# Patient Record
Sex: Male | Born: 1960
Health system: Southern US, Community
[De-identification: ages and names within clinical notes are randomized; demographics above are authoritative.]

## PROBLEM LIST (undated history)

## (undated) DIAGNOSIS — N186 End stage renal disease: Secondary | ICD-10-CM

## (undated) DIAGNOSIS — I2510 Atherosclerotic heart disease of native coronary artery without angina pectoris: Secondary | ICD-10-CM

## (undated) DIAGNOSIS — I509 Heart failure, unspecified: Secondary | ICD-10-CM

## (undated) DIAGNOSIS — Z992 Dependence on renal dialysis: Secondary | ICD-10-CM

## (undated) DIAGNOSIS — E785 Hyperlipidemia, unspecified: Secondary | ICD-10-CM

## (undated) DIAGNOSIS — I739 Peripheral vascular disease, unspecified: Secondary | ICD-10-CM

## (undated) DIAGNOSIS — N289 Disorder of kidney and ureter, unspecified: Secondary | ICD-10-CM

## (undated) DIAGNOSIS — F1910 Other psychoactive substance abuse, uncomplicated: Secondary | ICD-10-CM

## (undated) DIAGNOSIS — F1010 Alcohol abuse, uncomplicated: Secondary | ICD-10-CM

## (undated) DIAGNOSIS — K922 Gastrointestinal hemorrhage, unspecified: Secondary | ICD-10-CM

## (undated) DIAGNOSIS — K7460 Unspecified cirrhosis of liver: Secondary | ICD-10-CM

## (undated) DIAGNOSIS — Z951 Presence of aortocoronary bypass graft: Secondary | ICD-10-CM

## (undated) DIAGNOSIS — R45851 Suicidal ideations: Secondary | ICD-10-CM

## (undated) DIAGNOSIS — K219 Gastro-esophageal reflux disease without esophagitis: Secondary | ICD-10-CM

## (undated) DIAGNOSIS — I10 Essential (primary) hypertension: Secondary | ICD-10-CM

## (undated) HISTORY — PX: CORONARY ARTERY BYPASS GRAFT: SHX141

## (undated) HISTORY — DX: Peripheral vascular disease, unspecified: I73.9

## (undated) HISTORY — PX: CORONARY ANGIOPLASTY: SHX604

## (undated) HISTORY — PX: DIALYSIS FISTULA CREATION: SHX611

## (undated) HISTORY — PX: PERIPHERAL ARTERIAL STENT GRAFT: SHX2220

## (undated) HISTORY — PX: PARACENTESIS: SHX844

## (undated) HISTORY — DX: Hyperlipidemia, unspecified: E78.5

---

## 2007-03-15 DIAGNOSIS — I2510 Atherosclerotic heart disease of native coronary artery without angina pectoris: Secondary | ICD-10-CM

## 2007-03-15 HISTORY — DX: Atherosclerotic heart disease of native coronary artery without angina pectoris: I25.10

## 2009-11-18 ENCOUNTER — Emergency Department: Payer: Self-pay | Admitting: Internal Medicine

## 2009-11-19 ENCOUNTER — Observation Stay: Payer: Self-pay | Admitting: Internal Medicine

## 2009-12-17 ENCOUNTER — Inpatient Hospital Stay: Payer: Self-pay | Admitting: Internal Medicine

## 2010-02-11 ENCOUNTER — Ambulatory Visit: Payer: Self-pay | Admitting: Vascular Surgery

## 2010-02-16 ENCOUNTER — Emergency Department: Payer: Self-pay | Admitting: Emergency Medicine

## 2010-02-16 ENCOUNTER — Ambulatory Visit: Payer: Self-pay | Admitting: Vascular Surgery

## 2010-03-04 ENCOUNTER — Ambulatory Visit: Payer: Self-pay | Admitting: Vascular Surgery

## 2010-07-26 ENCOUNTER — Emergency Department: Payer: Self-pay | Admitting: Emergency Medicine

## 2010-10-31 ENCOUNTER — Inpatient Hospital Stay: Payer: Self-pay | Admitting: Internal Medicine

## 2010-11-10 ENCOUNTER — Ambulatory Visit: Payer: Self-pay | Admitting: Vascular Surgery

## 2011-01-12 ENCOUNTER — Ambulatory Visit: Payer: Self-pay | Admitting: Vascular Surgery

## 2011-01-27 ENCOUNTER — Inpatient Hospital Stay: Payer: Self-pay | Admitting: Vascular Surgery

## 2011-03-01 ENCOUNTER — Ambulatory Visit: Payer: Self-pay | Admitting: Vascular Surgery

## 2011-06-18 ENCOUNTER — Observation Stay: Payer: Self-pay | Admitting: Internal Medicine

## 2011-06-18 LAB — CBC
HGB: 14.8 g/dL (ref 13.0–18.0)
MCH: 31.8 pg (ref 26.0–34.0)
MCHC: 33.1 g/dL (ref 32.0–36.0)
MCV: 96 fL (ref 80–100)
Platelet: 228 10*3/uL (ref 150–440)
RBC: 4.66 10*6/uL (ref 4.40–5.90)
WBC: 9.9 10*3/uL (ref 3.8–10.6)

## 2011-06-18 LAB — DRUG SCREEN, URINE
Amphetamines, Ur Screen: NEGATIVE (ref ?–1000)
Benzodiazepine, Ur Scrn: NEGATIVE (ref ?–200)
Cannabinoid 50 Ng, Ur ~~LOC~~: NEGATIVE (ref ?–50)
Cocaine Metabolite,Ur ~~LOC~~: NEGATIVE (ref ?–300)
MDMA (Ecstasy)Ur Screen: NEGATIVE (ref ?–500)
Methadone, Ur Screen: NEGATIVE (ref ?–300)
Tricyclic, Ur Screen: POSITIVE (ref ?–1000)

## 2011-06-18 LAB — COMPREHENSIVE METABOLIC PANEL
Albumin: 4.1 g/dL (ref 3.4–5.0)
Anion Gap: 12 (ref 7–16)
BUN: 16 mg/dL (ref 7–18)
Calcium, Total: 9.6 mg/dL (ref 8.5–10.1)
Creatinine: 4.74 mg/dL — ABNORMAL HIGH (ref 0.60–1.30)
EGFR (African American): 17 — ABNORMAL LOW
EGFR (Non-African Amer.): 14 — ABNORMAL LOW
Glucose: 81 mg/dL (ref 65–99)
Potassium: 4.1 mmol/L (ref 3.5–5.1)
SGOT(AST): 17 U/L (ref 15–37)

## 2011-06-18 LAB — CK TOTAL AND CKMB (NOT AT ARMC): CK, Total: 106 U/L (ref 35–232)

## 2011-06-18 LAB — PRO B NATRIURETIC PEPTIDE: B-Type Natriuretic Peptide: 1354 pg/mL — ABNORMAL HIGH (ref 0–125)

## 2011-06-18 LAB — TROPONIN I: Troponin-I: <0.02 ng/mL

## 2011-06-19 LAB — BASIC METABOLIC PANEL
Anion Gap: 12 (ref 7–16)
BUN: 35 mg/dL — ABNORMAL HIGH (ref 7–18)
Calcium, Total: 9.3 mg/dL (ref 8.5–10.1)
Co2: 26 mmol/L (ref 21–32)
Creatinine: 6.96 mg/dL — ABNORMAL HIGH (ref 0.60–1.30)
EGFR (Non-African Amer.): 9 — ABNORMAL LOW
Glucose: 99 mg/dL (ref 65–99)
Sodium: 135 mmol/L — ABNORMAL LOW (ref 136–145)

## 2011-06-19 LAB — TROPONIN I
Troponin-I: <0.02 ng/mL
Troponin-I: <0.02 ng/mL

## 2011-06-19 LAB — CBC WITH DIFFERENTIAL/PLATELET
Basophil %: 0.4 %
Eosinophil #: 0.1 10*3/uL (ref 0.0–0.7)
Lymphocyte #: 1.2 10*3/uL (ref 1.0–3.6)
MCH: 32 pg (ref 26.0–34.0)
MCHC: 33.1 g/dL (ref 32.0–36.0)
Neutrophil #: 5.7 10*3/uL (ref 1.4–6.5)
Neutrophil %: 71.4 %
RBC: 4.32 10*6/uL — ABNORMAL LOW (ref 4.40–5.90)
RDW: 14.5 % (ref 11.5–14.5)
WBC: 7.9 10*3/uL (ref 3.8–10.6)

## 2011-06-19 LAB — CK TOTAL AND CKMB (NOT AT ARMC)
CK, Total: 63 U/L (ref 35–232)
CK, Total: 73 U/L (ref 35–232)
CK-MB: <0.5 ng/mL — ABNORMAL LOW (ref 0.5–3.6)

## 2011-08-05 ENCOUNTER — Ambulatory Visit: Payer: Self-pay | Admitting: Internal Medicine

## 2011-10-15 IMAGING — CR DG CHEST 1V PORT
1 series · 1 of 1 positions shown · non-contrast
Comparison: none

REASON FOR EXAM: shortness of breath
COMMENTS:

PROCEDURE:     DXR - DXR PORTABLE CHEST SINGLE VIEW  - November 19, 2009  [DATE]
RESULT:     The lung fields are clear. No pneumonia, pneumothorax or pleural
effusion is seen. The heart is mildly enlarged. Postoperative changes of
prior CABG are noted. No acute bony abnormalities are seen.

[view not recorded]
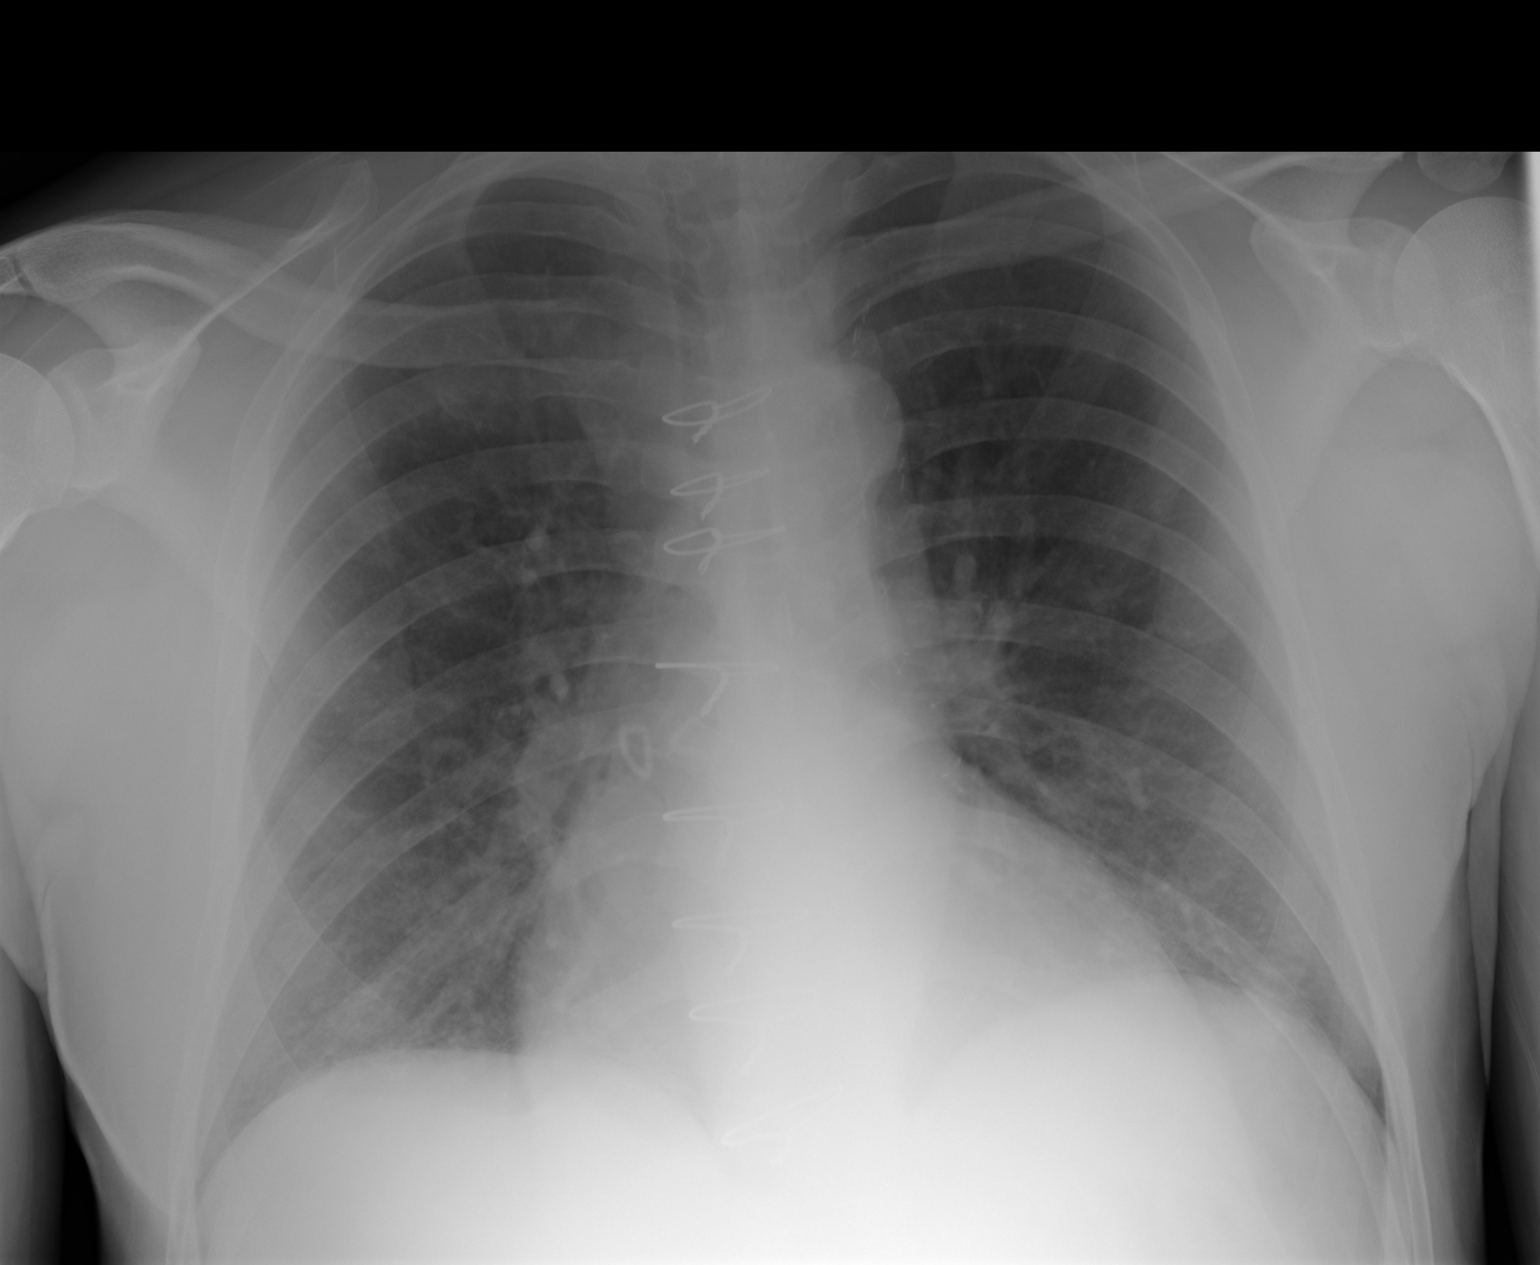

[1 of 1 positions shown; findings below may reference images not displayed]

IMPRESSION: 1. No acute changes are identified.
2. The heart appears mildly enlarged.

## 2011-11-12 IMAGING — CR DG CHEST 1V PORT
1 series · 1 of 1 positions shown · non-contrast
Comparison: none

REASON FOR EXAM: cp
COMMENTS:

[view not recorded]
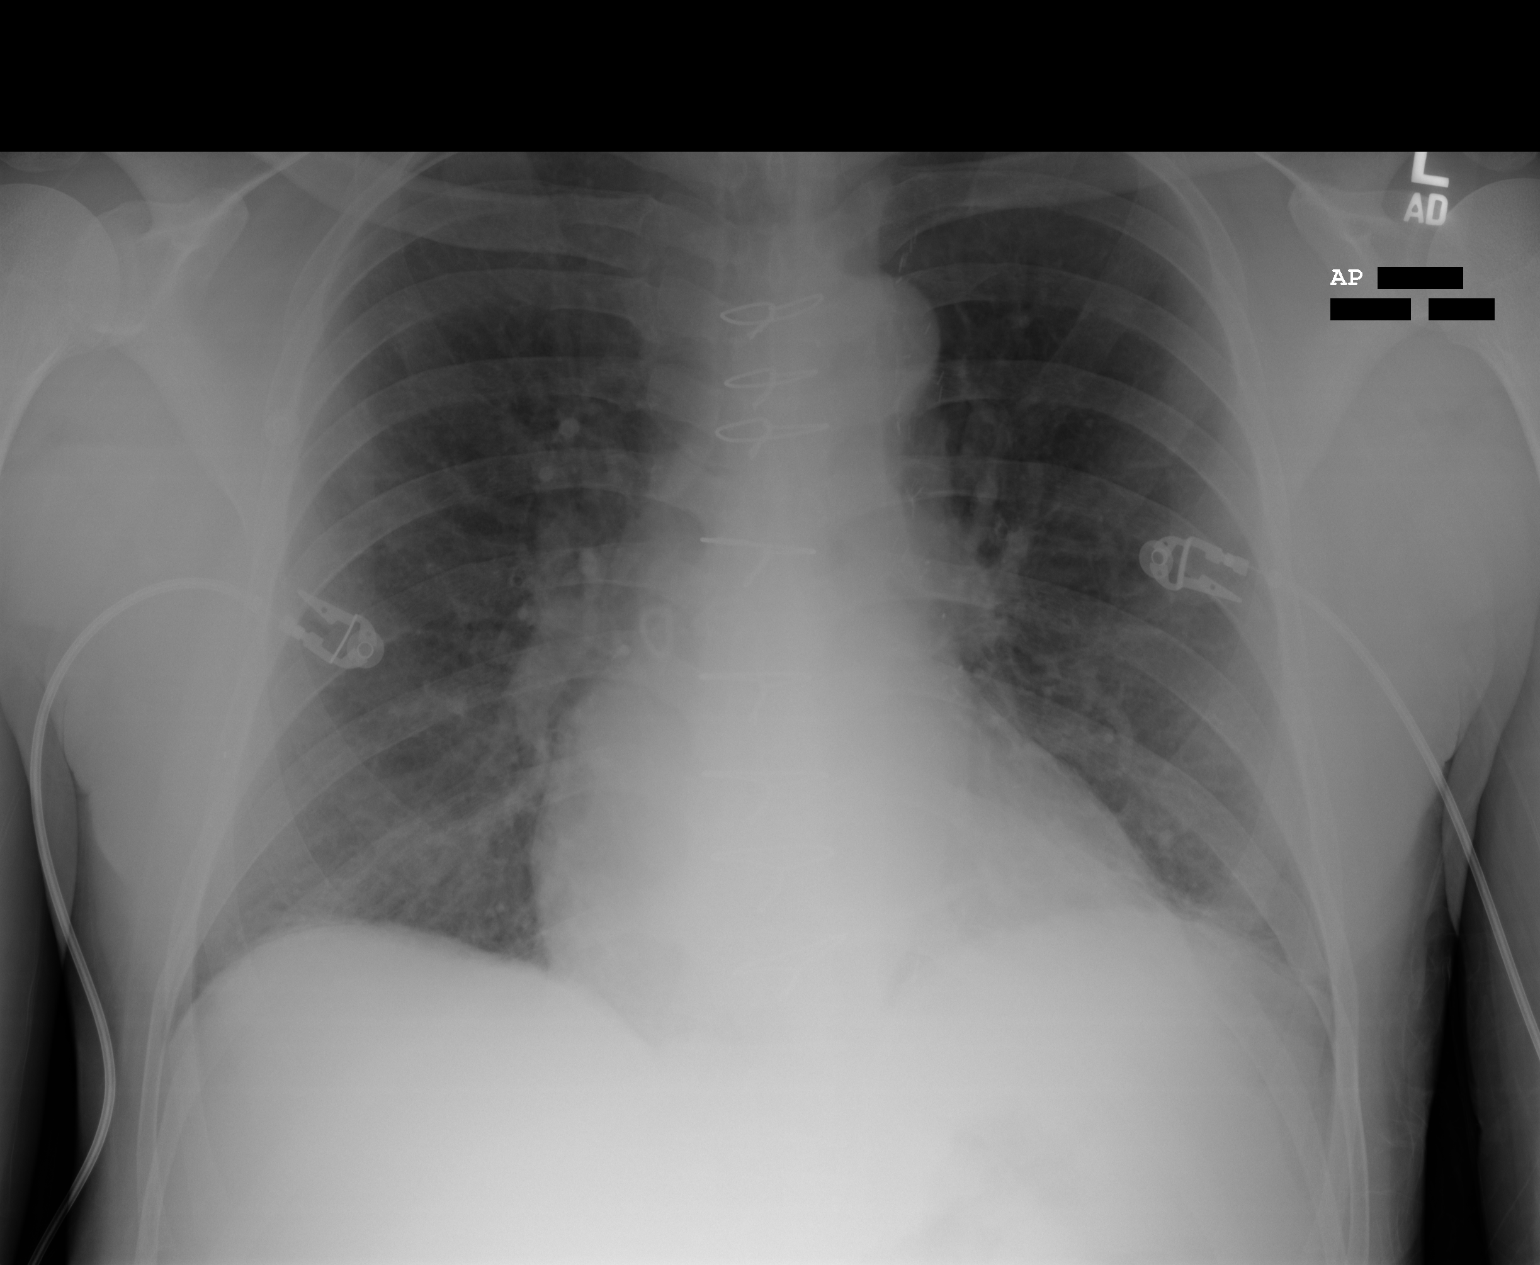

[1 of 1 positions shown; findings below may reference images not displayed]

PROCEDURE:     DXR - DXR PORTABLE CHEST SINGLE VIEW  - December 17, 2009  [DATE]

RESULT:     Comparison is made to a prior exam of 11/19/2009.

The lung fields are clear. No pneumonia is seen. No pulmonary edema or
pleural effusion is observed. The heart size is within normal limits. Post
operative changes of prior CABG are observed.
IMPRESSION: No acute changes are identified.

## 2012-01-12 IMAGING — XA IR VASCULAR PROCEDURE
15 of 24 series · 15 of 24 positions shown · IV contrast (IODINE)
Comparison: none

[Series 1: care upper arm · 1 of 2 slices shown (1 of 11)]
[im 1/2]
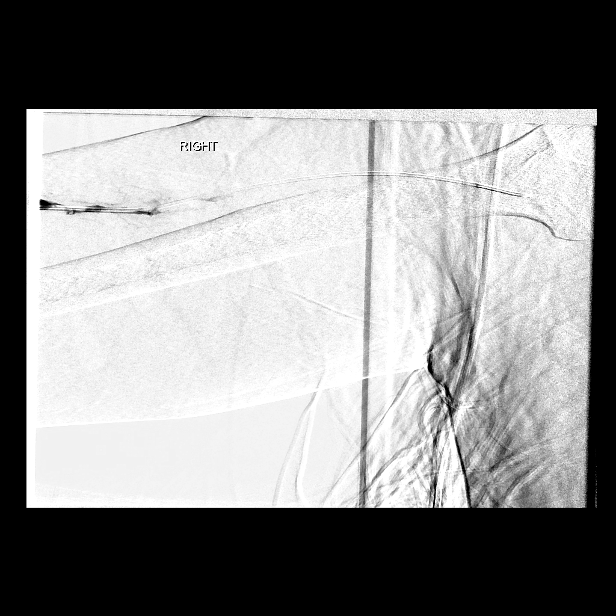

[Series 3: fl - angio · 1 of 1 slices shown (1 of 4)]
[im 1/1]
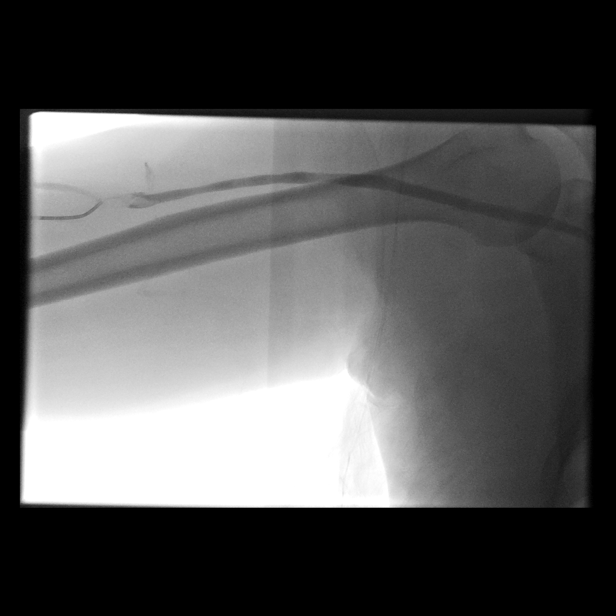

[Series 5: care upper arm · 1 of 2 slices shown (2 of 11)]
[im 1/2]
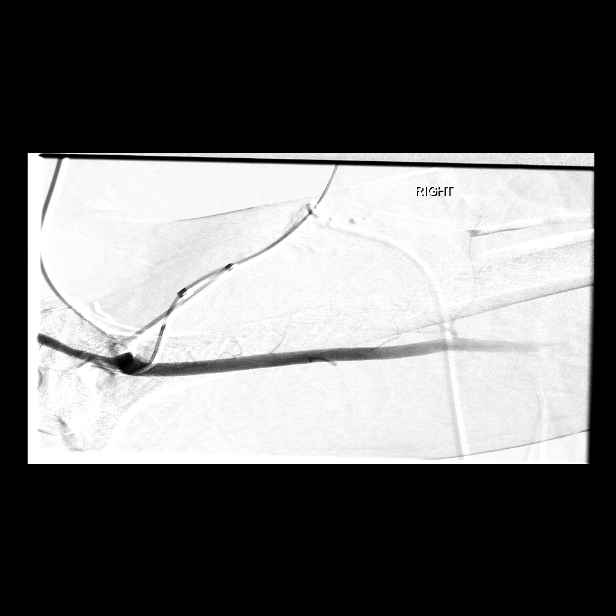

[Series 6: care upper arm · 1 of 2 slices shown (3 of 11)]
[im 1/2]
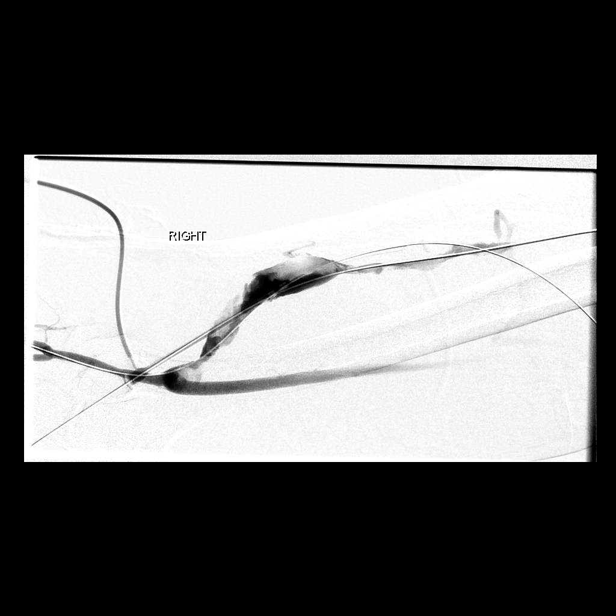

[Series 8: care upper arm · 1 of 2 slices shown (4 of 11)]
[im 1/2]
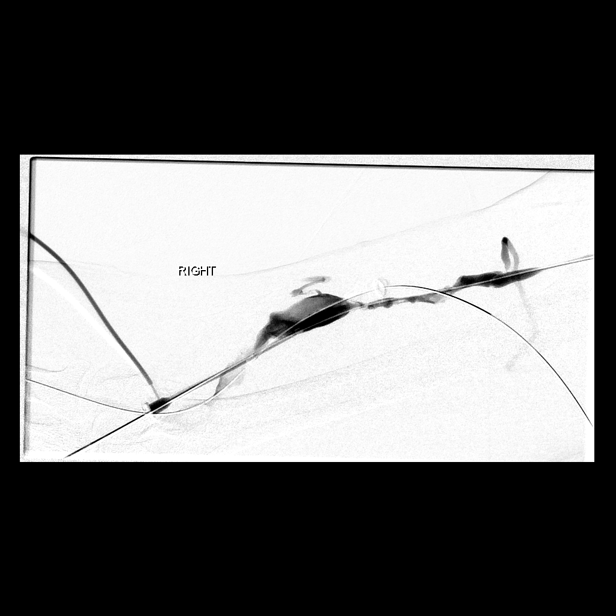

[Series 9: fl - angio · 1 of 1 slices shown (2 of 4)]
[im 1/1]
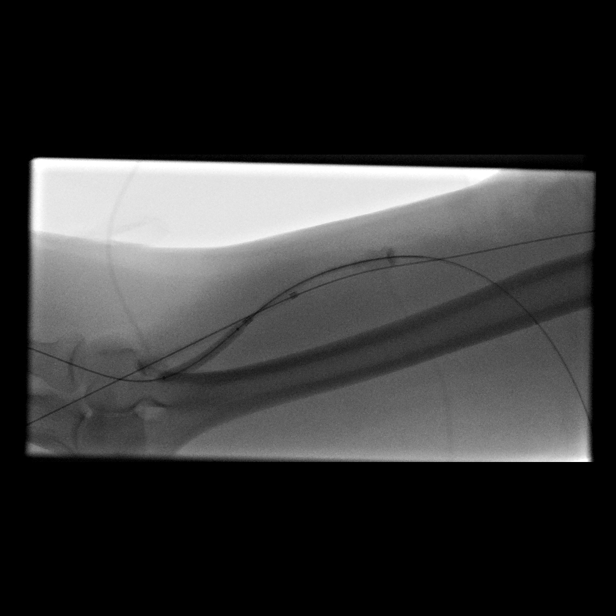

[Series 11: care upper arm · 1 of 2 slices shown (5 of 11)]
[im 1/2]
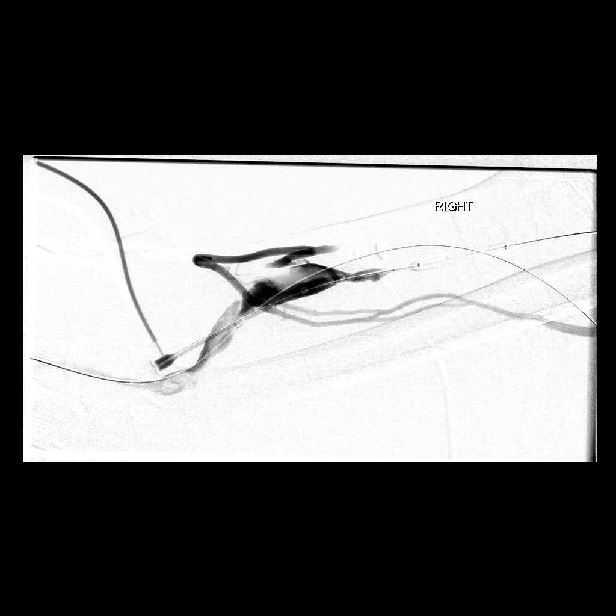

[Series 13: care upper arm · 1 of 2 slices shown (6 of 11)]
[im 1/2]
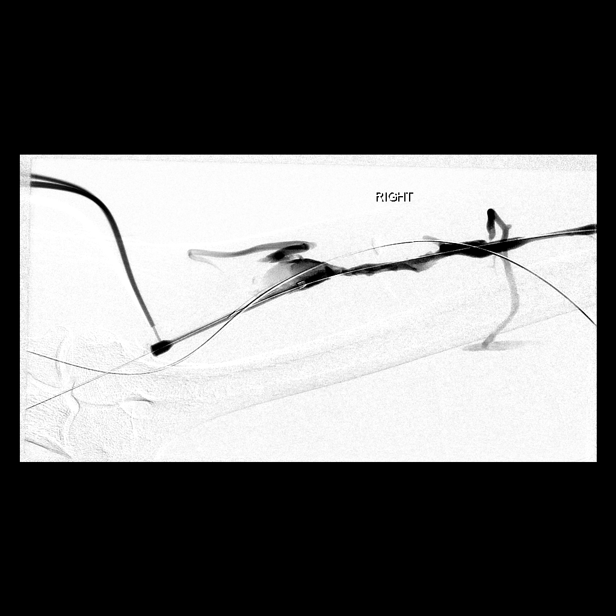

[Series 15: care upper arm · 1 of 2 slices shown (7 of 11)]
[im 1/2]
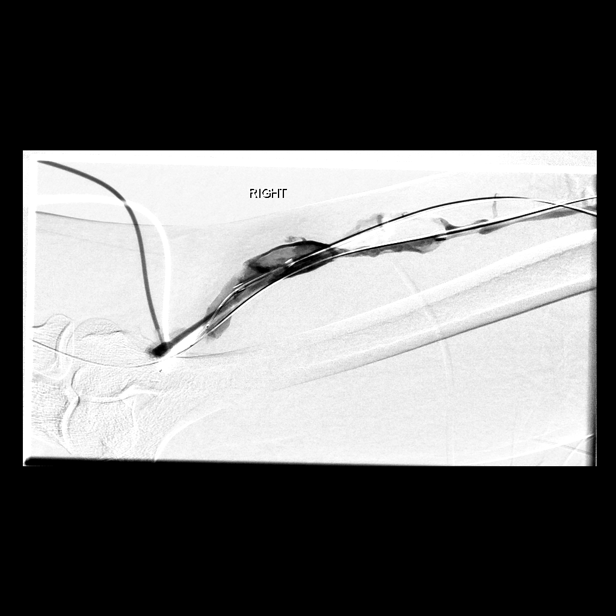

[Series 17: care upper arm · 1 of 3 slices shown (8 of 11)]
[im 1/3]
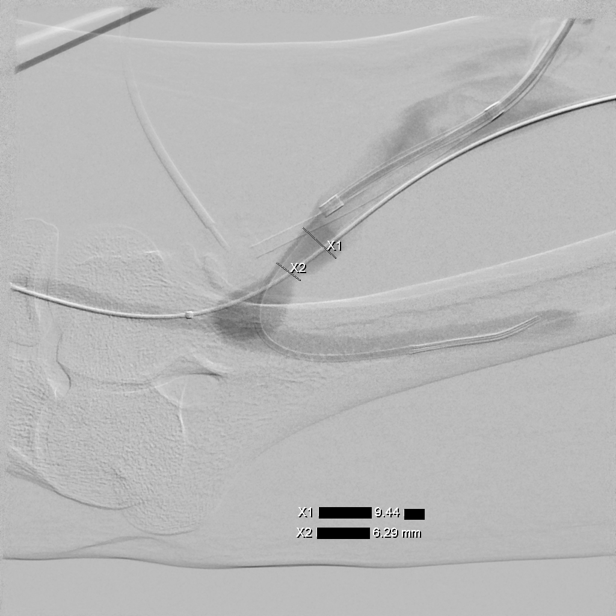

[Series 18: care upper arm · 1 of 2 slices shown (9 of 11)]
[im 1/2]
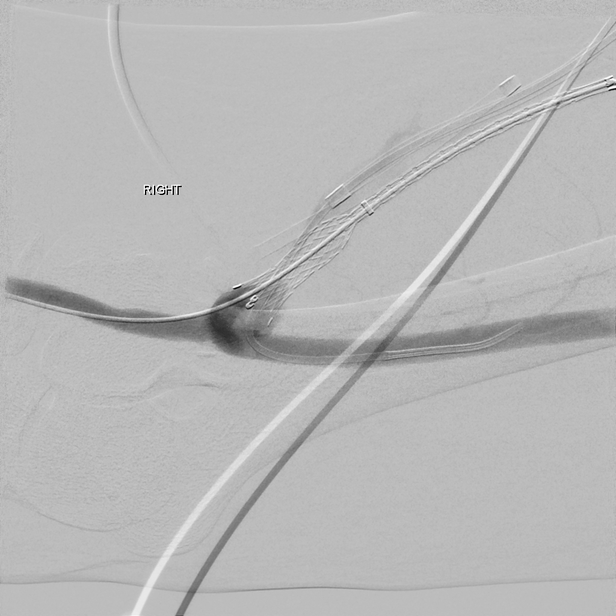

[Series 20: care upper arm · 1 of 2 slices shown (10 of 11)]
[im 1/2]
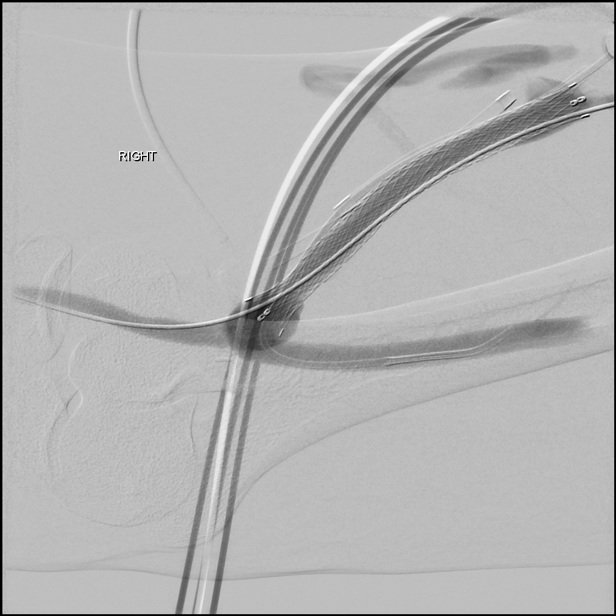

[Series 22: fl - angio · 1 of 1 slices shown (3 of 4)]
[im 1/1]
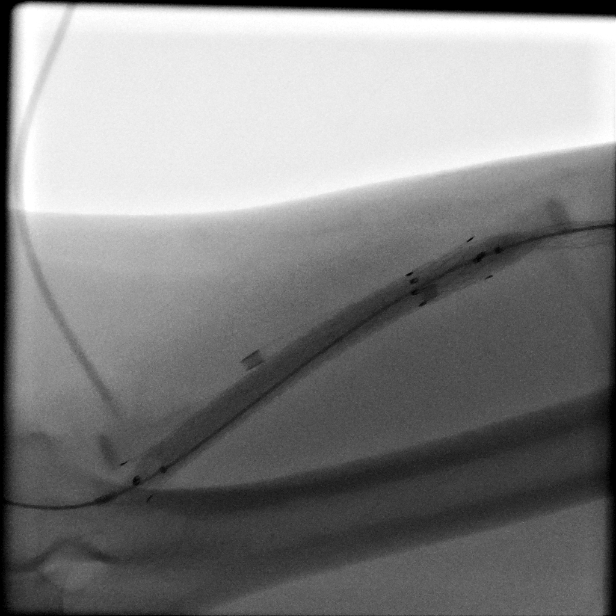

[Series 23: fl - angio · 1 of 1 slices shown (4 of 4)]
[im 1/1]
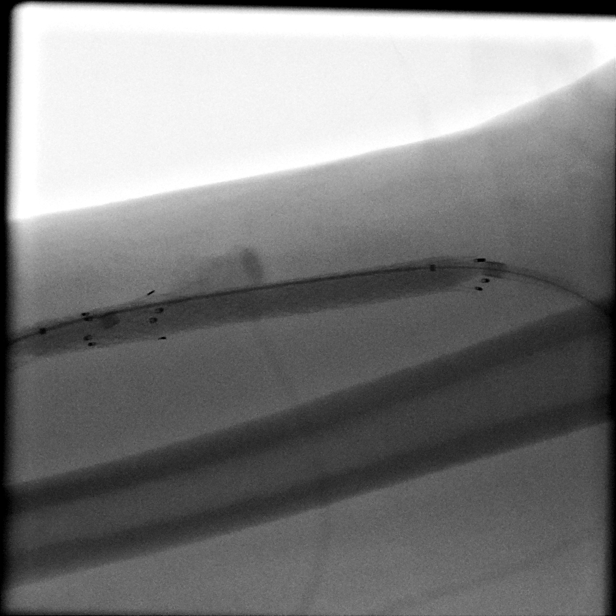

[Series 25: care upper arm · 1 of 2 slices shown (11 of 11)]
[im 1/2]
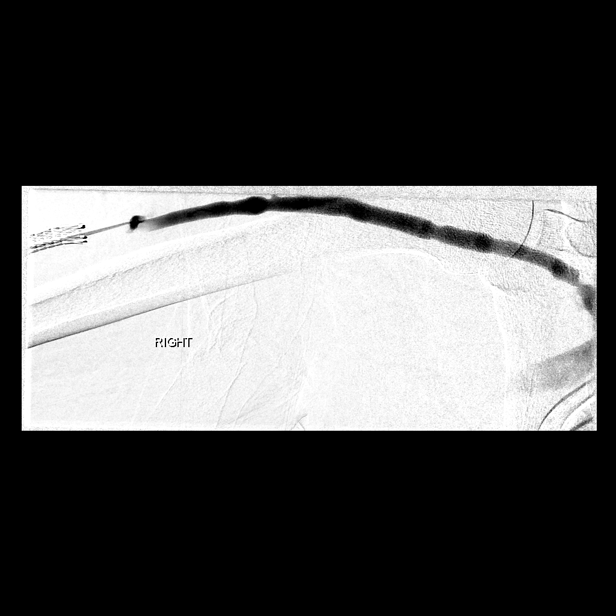

[15 of 24 positions shown; findings below may reference images not displayed]

IMAGES IMPORTED FROM THE [employer] WORKFLOW SYSTEM
NO DICTATION FOR STUDY

## 2012-01-28 IMAGING — XA IR VASCULAR PROCEDURE
15 of 24 series · 15 of 24 positions shown · IV contrast (IODINE)
Comparison: none

[Series 1: care aorta · 1 of 2 slices shown (1 of 4)]
[im 1/2]
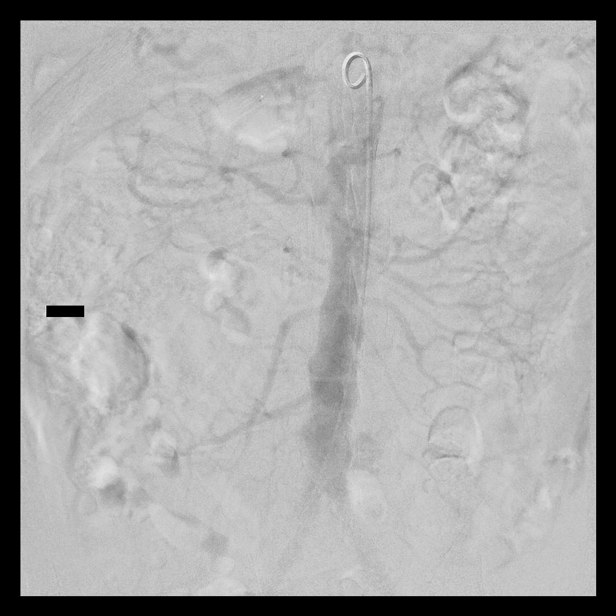

[Series 3: care aorta · 1 of 2 slices shown (2 of 4)]
[im 1/2]
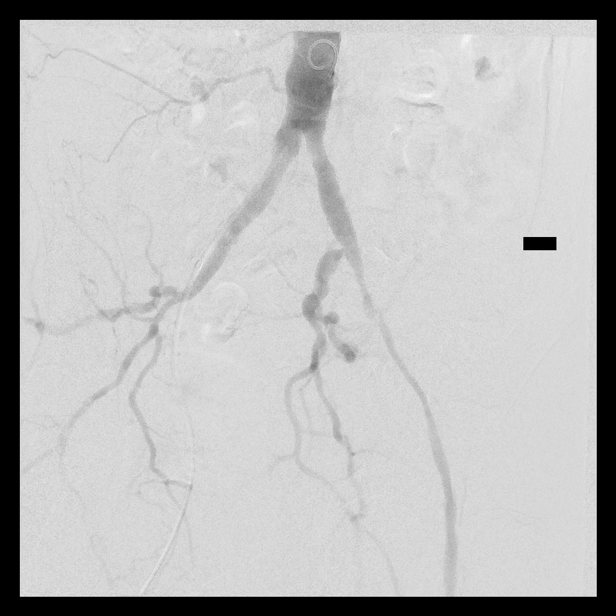

[Series 5: care aorta · 1 of 2 slices shown (3 of 4)]
[im 1/2]
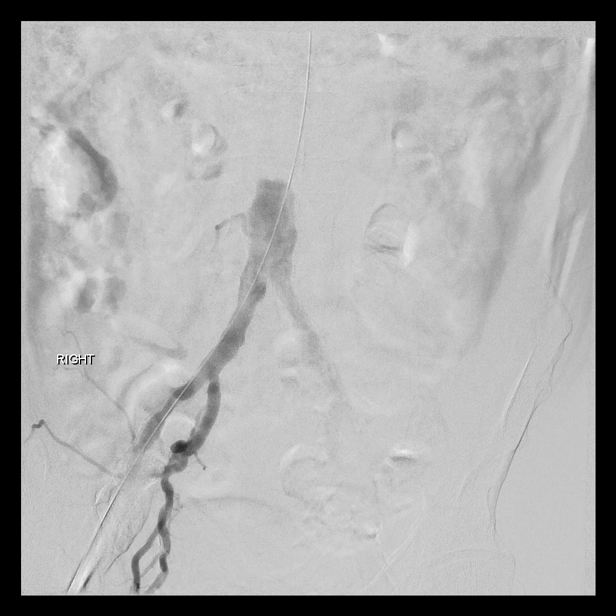

[Series 6: care aorta · 1 of 2 slices shown (4 of 4)]
[im 1/2]
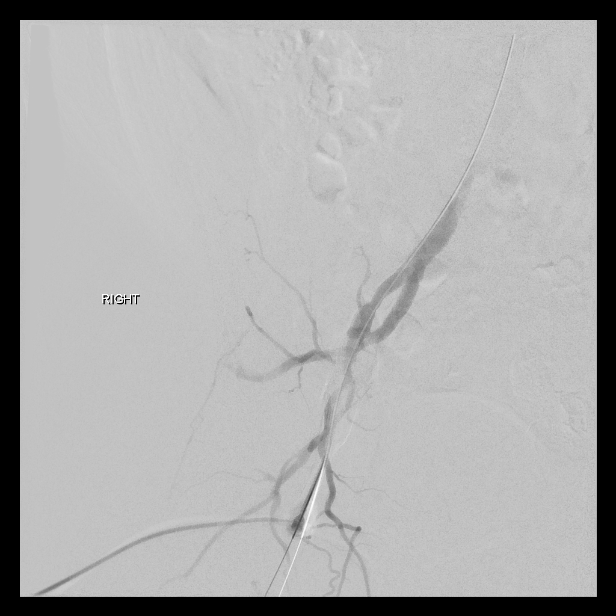

[Series 8: fl - angio · 1 of 1 slices shown (1 of 5)]
[im 1/1]
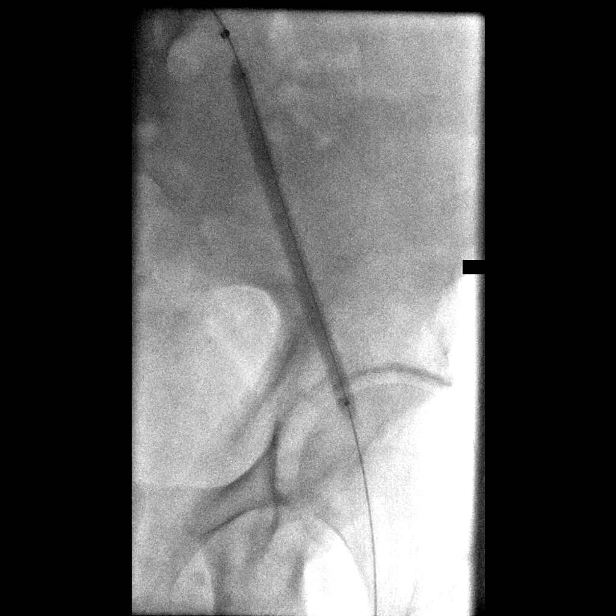

[Series 9: sfa · 1 of 2 slices shown (1 of 6)]
[im 1/2]
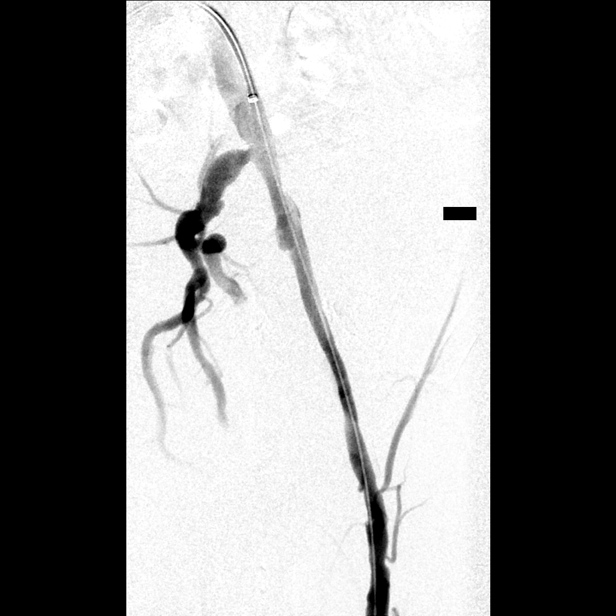

[Series 11: sfa · 1 of 2 slices shown (2 of 6)]
[im 1/2]
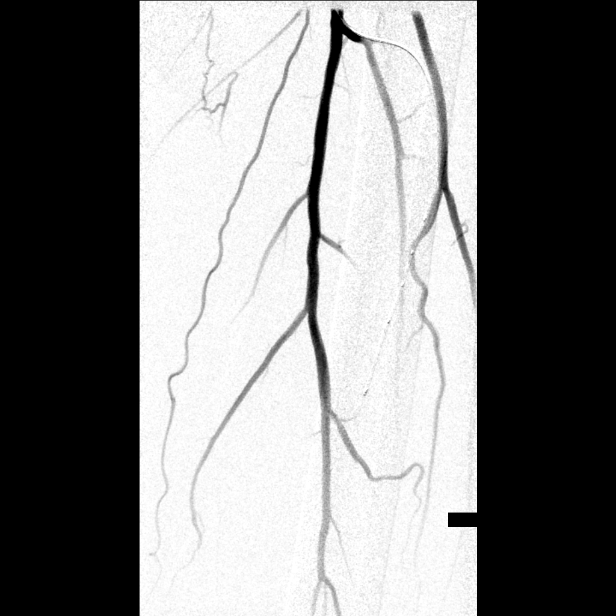

[Series 13: sfa · 1 of 2 slices shown (3 of 6)]
[im 1/2]
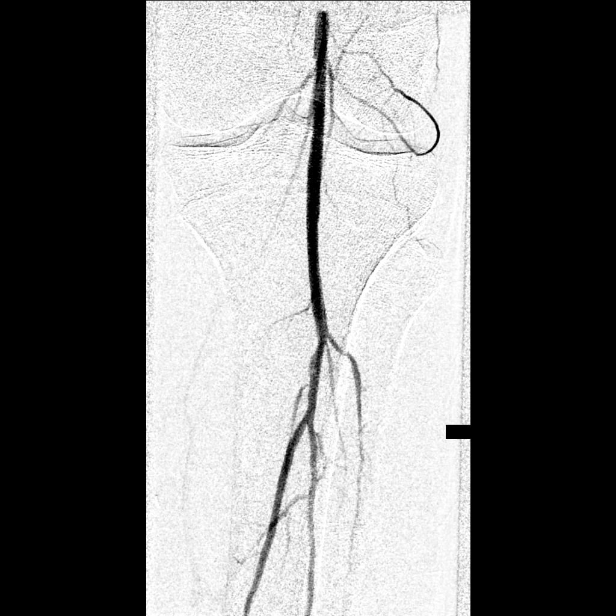

[Series 14: sfa · 1 of 2 slices shown (4 of 6)]
[im 1/2]
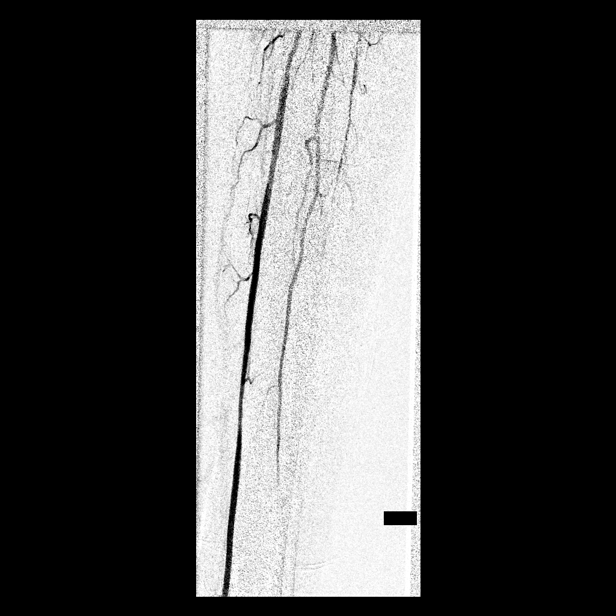

[Series 16: sfa · 1 of 2 slices shown (5 of 6)]
[im 1/2]
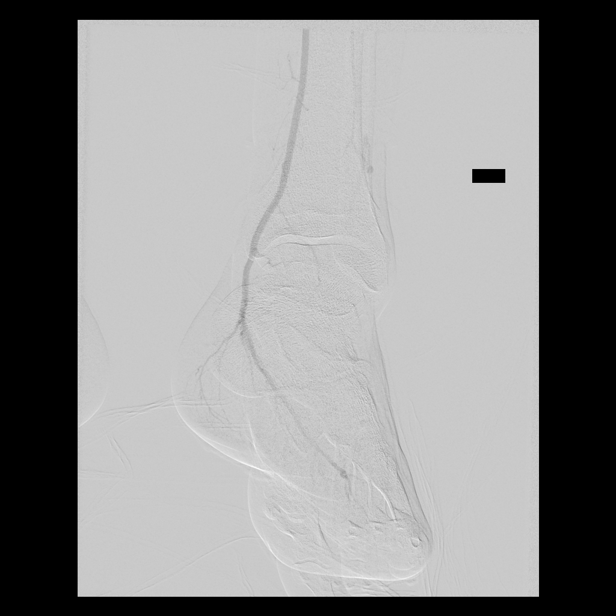

[Series 17: fl - angio · 1 of 1 slices shown (2 of 5)]
[im 1/1]
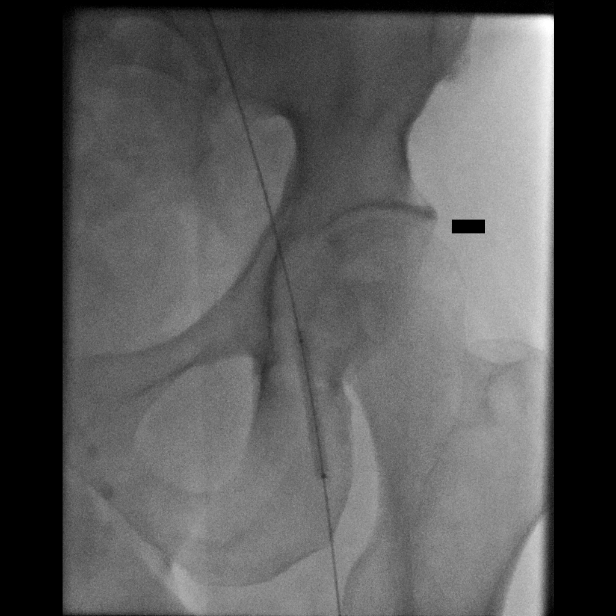

[Series 19: fl - angio · 1 of 1 slices shown (3 of 5)]
[im 1/1]
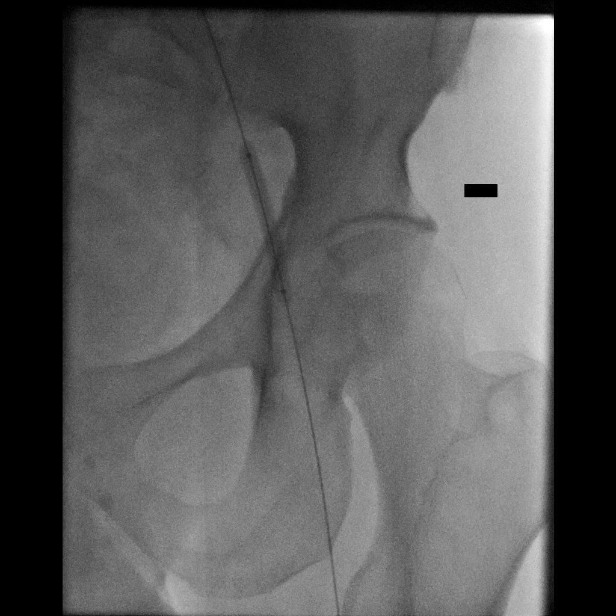

[Series 21: fl - angio · 1 of 1 slices shown (4 of 5)]
[im 1/1]
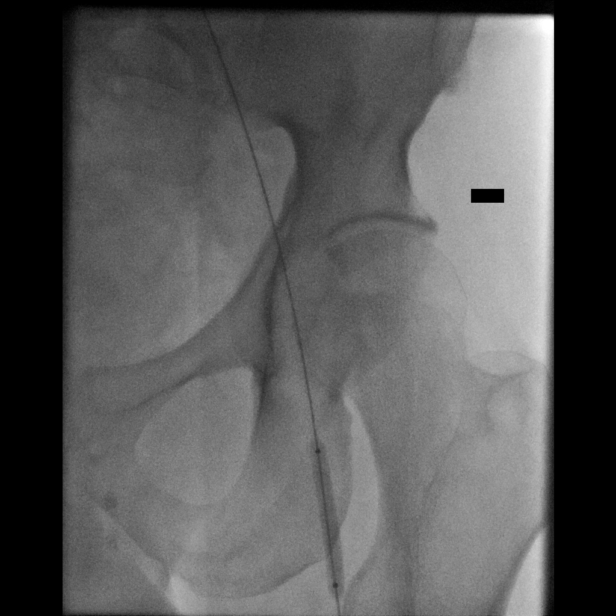

[Series 22: sfa · 1 of 2 slices shown (6 of 6)]
[im 1/2]
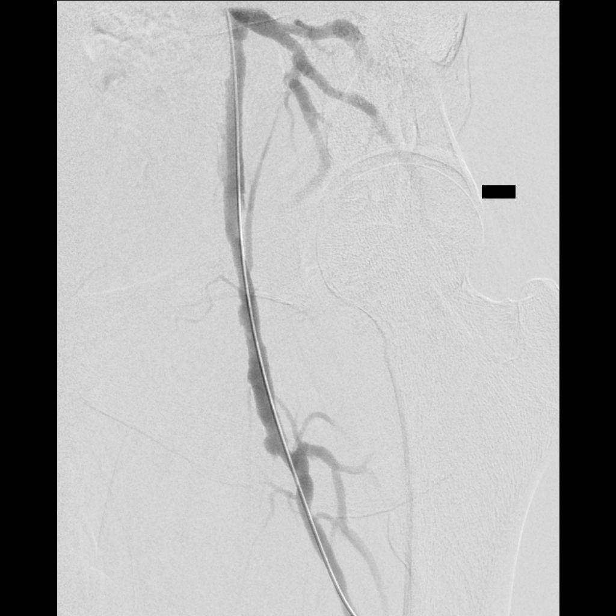

[Series 24: fl - angio · 1 of 1 slices shown (5 of 5)]
[im 1/1]
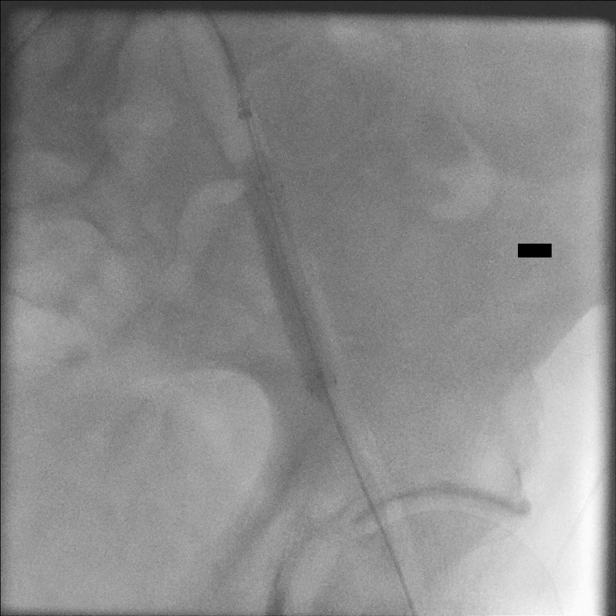

[15 of 24 positions shown; findings below may reference images not displayed]

IMAGES IMPORTED FROM THE [employer] WORKFLOW SYSTEM
NO DICTATION FOR STUDY

## 2012-03-16 ENCOUNTER — Ambulatory Visit: Payer: Self-pay | Admitting: Vascular Surgery

## 2012-03-16 LAB — POTASSIUM: Potassium: 5.1 mmol/L (ref 3.5–5.1)

## 2012-06-25 ENCOUNTER — Ambulatory Visit: Payer: Self-pay | Admitting: Vascular Surgery

## 2012-09-04 ENCOUNTER — Other Ambulatory Visit: Payer: Self-pay | Admitting: Nephrology

## 2012-09-04 LAB — POTASSIUM: Potassium: 5 mmol/L (ref 3.5–5.1)

## 2012-09-25 IMAGING — CR DG CHEST 1V PORT
1 series · 1 of 1 positions shown · non-contrast
Comparison: none

REASON FOR EXAM: chest pain
COMMENTS:

[view not recorded]
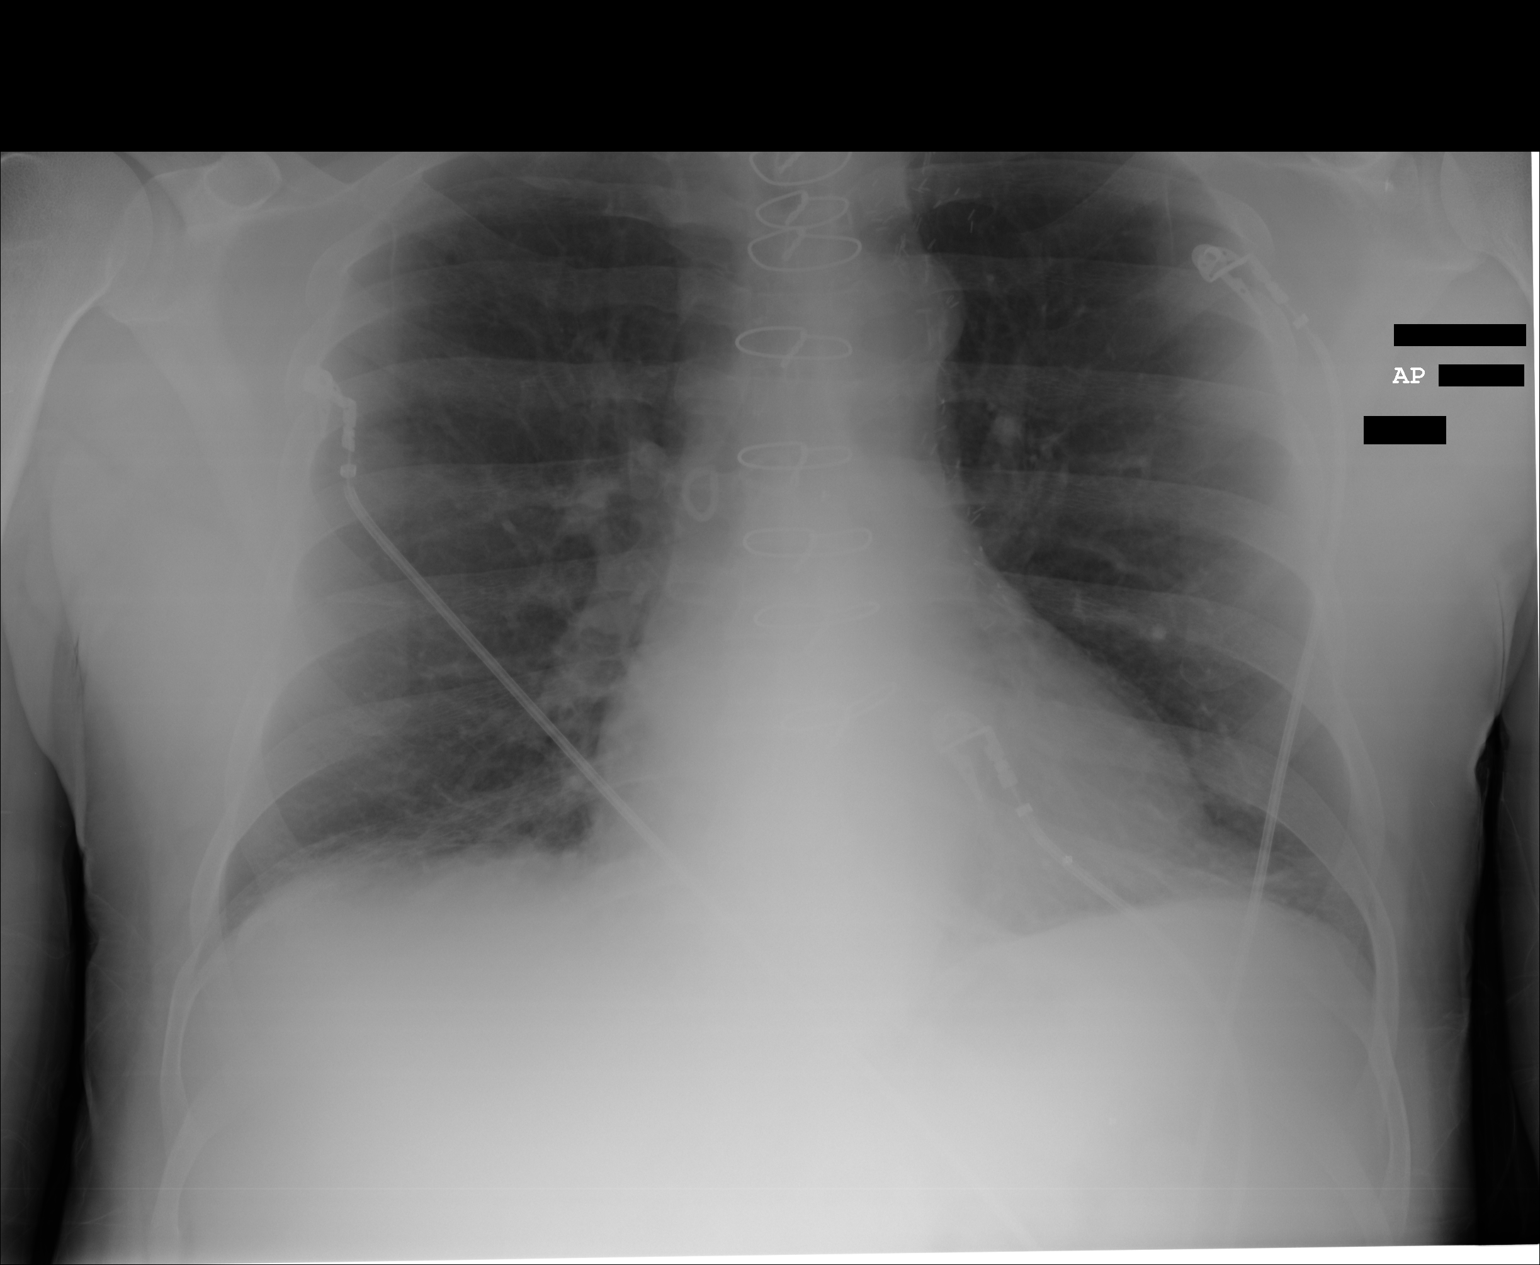

[1 of 1 positions shown; findings below may reference images not displayed]

PROCEDURE:     DXR - DXR PORTABLE CHEST SINGLE VIEW  - October 31, 2010 [DATE]

RESULT:     Comparison is made to the prior exam of 12/17/2009. The lung
fields are clear. No pneumonia, pneumothorax or pleural effusion is seen. No
acute changes of the heart or pulmonary vasculature are identified. The
patient is status post CABG. Monitoring electrodes are present.
IMPRESSION: 1.     No acute changes are identified.

## 2012-10-05 IMAGING — XA IR VASCULAR PROCEDURE
14 of 18 series · 15 of 24 positions shown · non-contrast
Comparison: none

[Series 2: care aorta · 1 of 2 slices shown (1 of 2)]
[im 1/2]
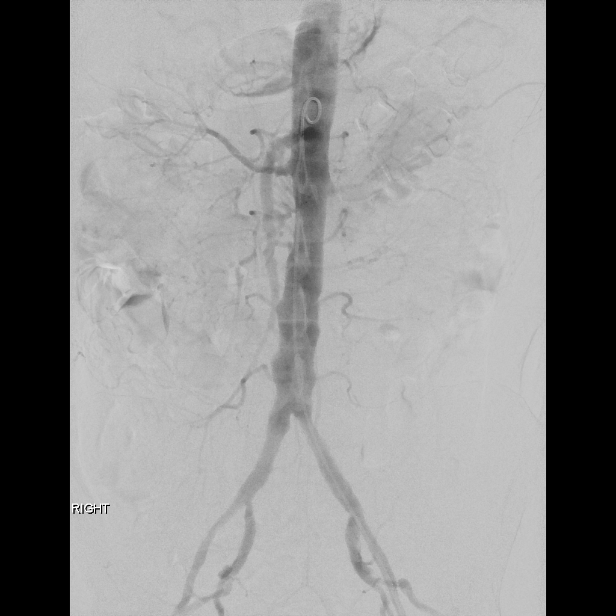

[Series 4: care aorta · 1 of 2 slices shown (2 of 2)]
[im 1/2]
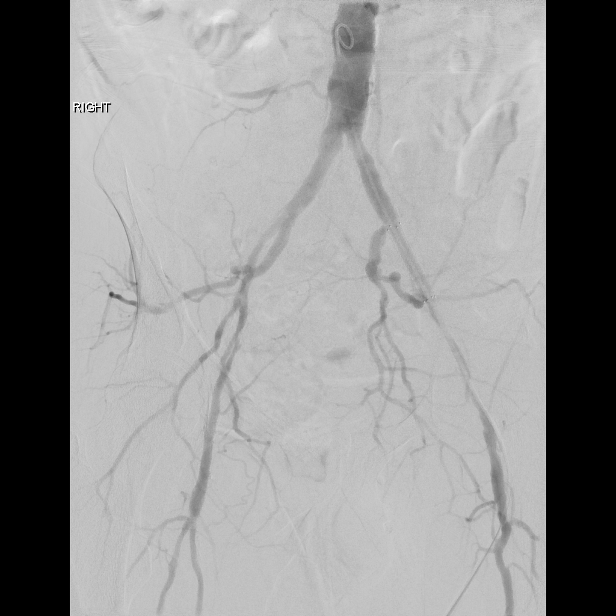

[Series 6: care sfa · 1 of 2 slices shown (1 of 8)]
[im 1/2]
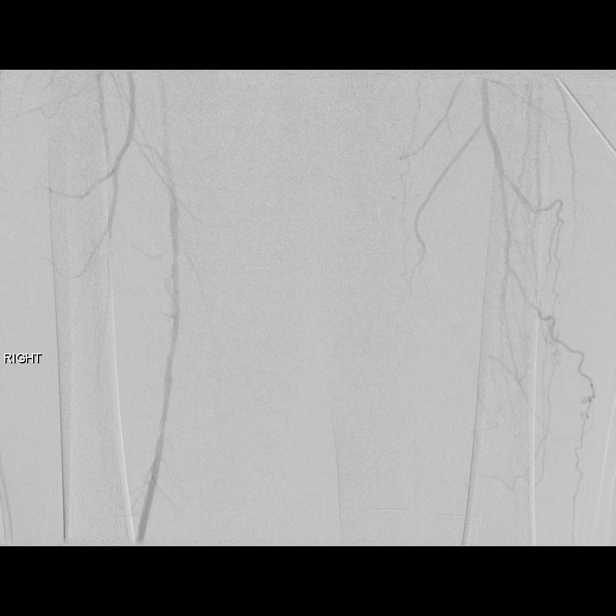

[Series 7: care sfa · 1 of 2 slices shown (2 of 8)]
[im 1/2]
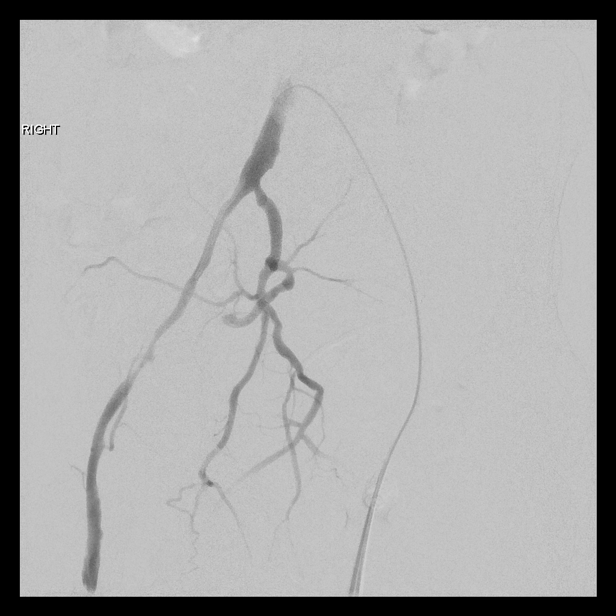

[Series 9: fl - angio · 1 of 1 slices shown (1 of 4)]
[im 1/1]
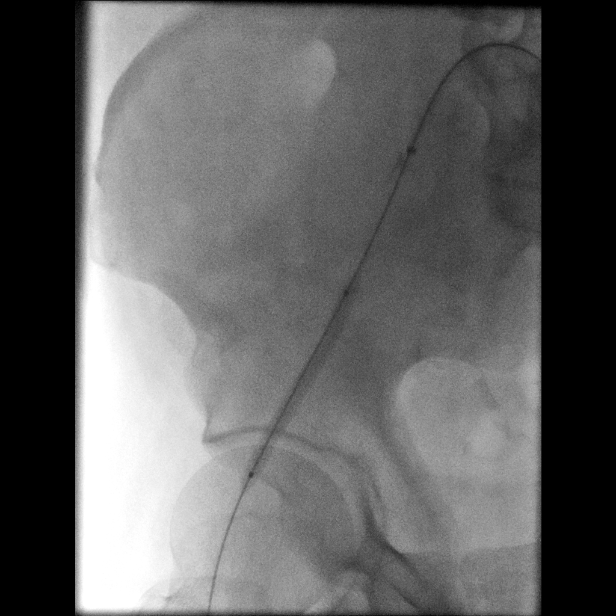

[Series 10: fl - angio · 1 of 1 slices shown (2 of 4)]
[im 1/1]
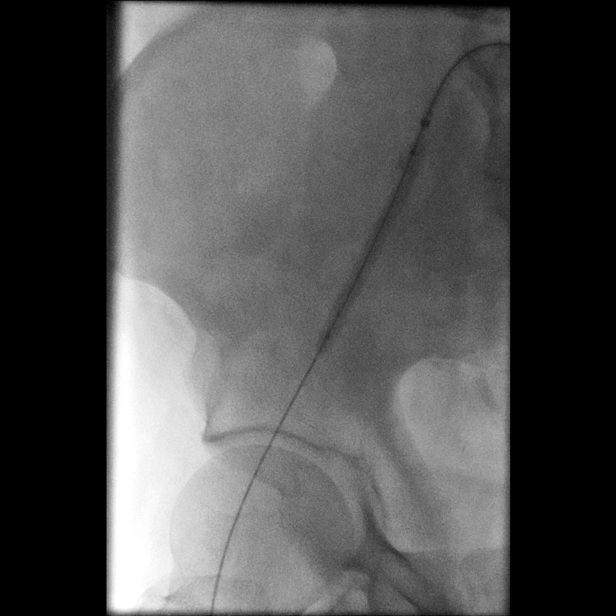

[Series 11: care sfa · 1 of 2 slices shown (3 of 8)]
[im 2/2]
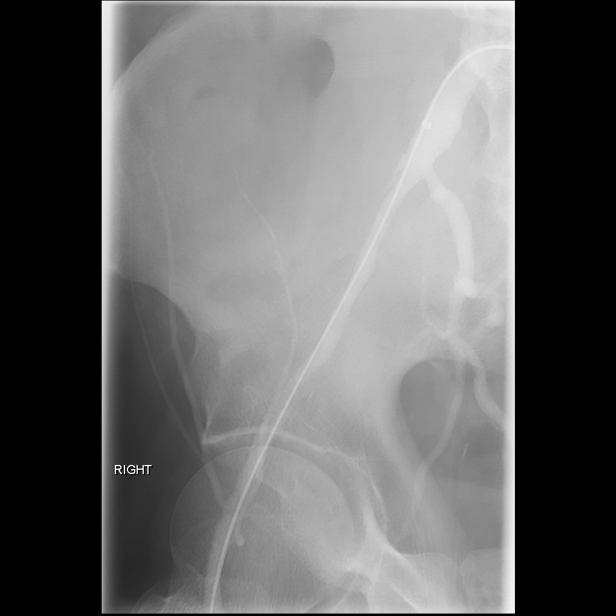

[Series 13: fl - angio · 1 of 1 slices shown (3 of 4)]
[im 1/1]
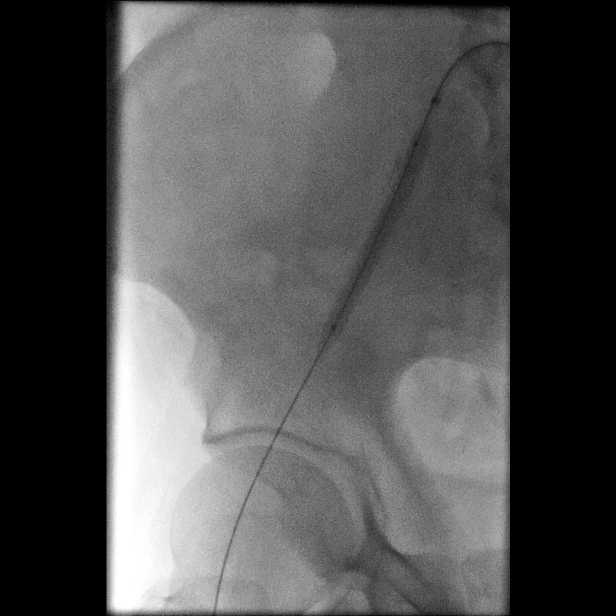

[Series 14: care sfa · 1 of 2 slices shown (4 of 8)]
[im 1/2]
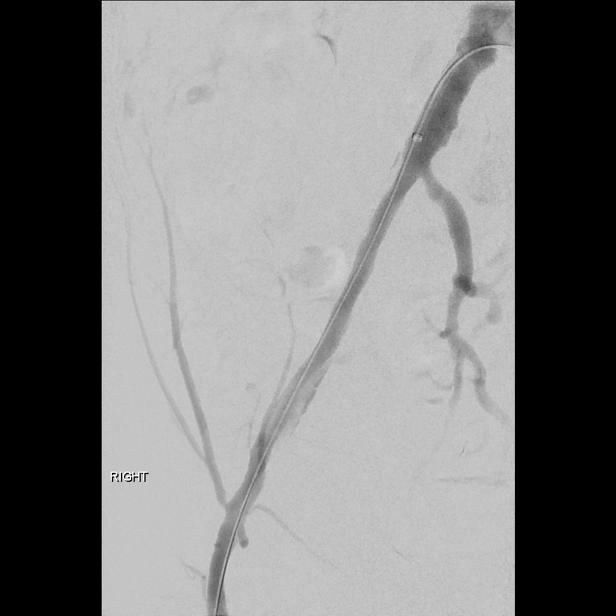

[Series 15: fl - angio · 1 of 1 slices shown (4 of 4)]
[im 1/1]
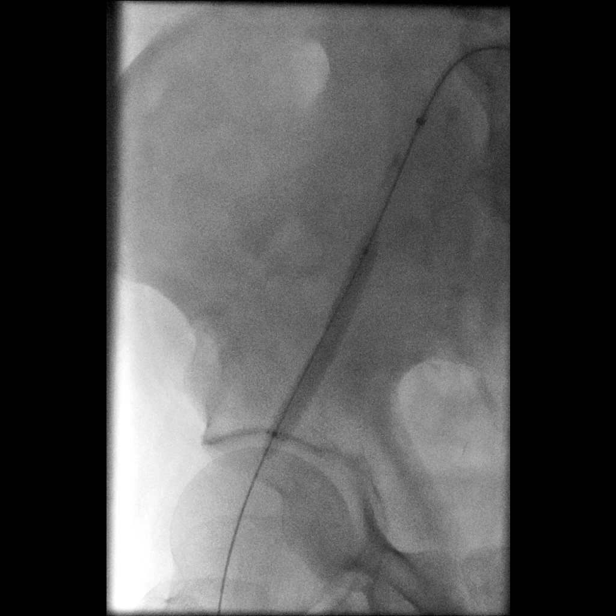

[Series 16: care sfa · 1 of 2 slices shown (5 of 8)]
[im 1/2]
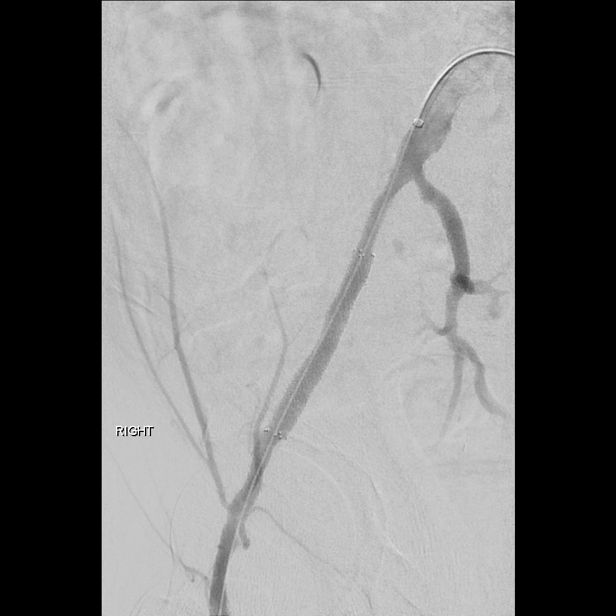

[Series 17: care sfa · 1 of 2 slices shown (6 of 8)]
[im 1/2]
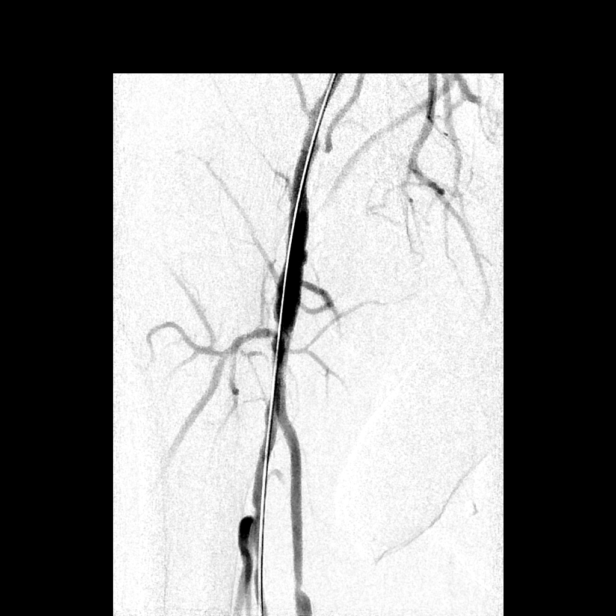

[Series 18: care sfa · 2 of 2 slices shown (7 of 8)]
[im 1/2]
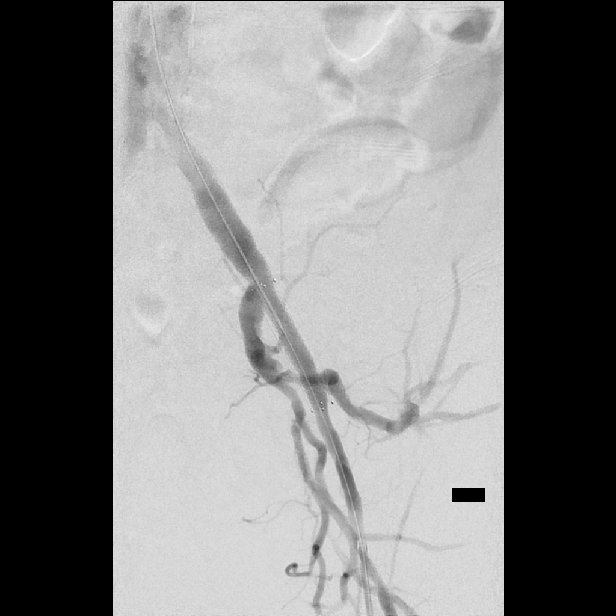
[im 2/2]
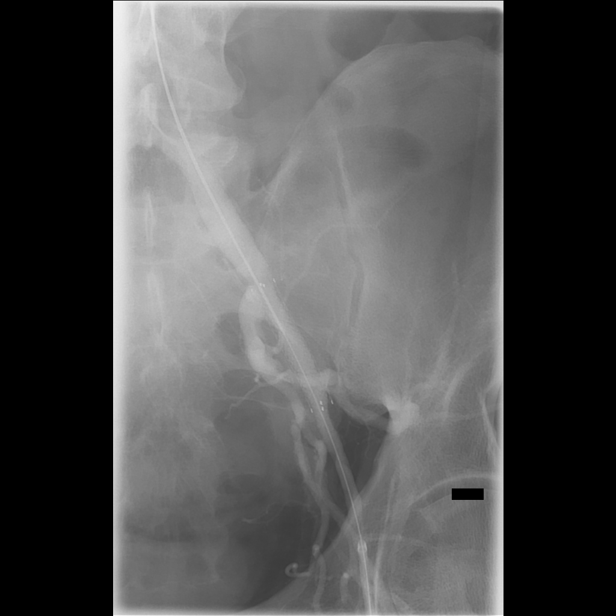

[Series 19: care sfa · 1 of 2 slices shown (8 of 8)]
[im 2/2]
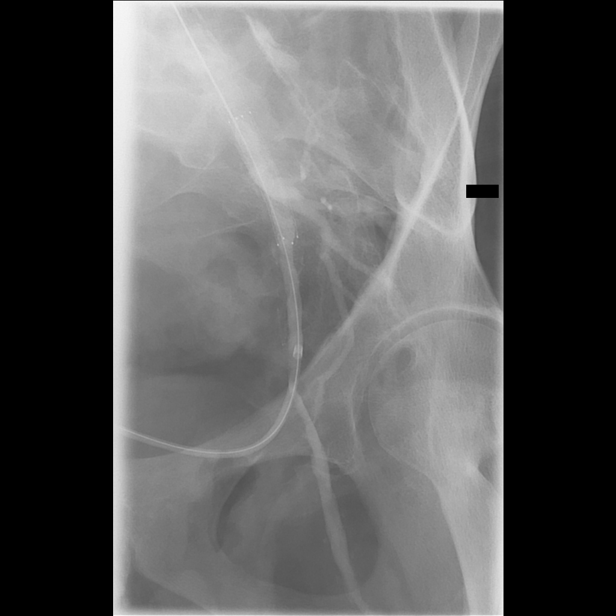

[15 of 24 positions shown; findings below may reference images not displayed]

IMAGES IMPORTED FROM THE [employer] WORKFLOW SYSTEM
NO DICTATION FOR STUDY

## 2012-12-07 IMAGING — XA IR VASCULAR PROCEDURE
14 of 18 series · 15 of 24 positions shown · IV contrast (IODINE)
Comparison: none

[Series 1: care aorta · 1 of 2 slices shown (1 of 9)]
[im 1/2]
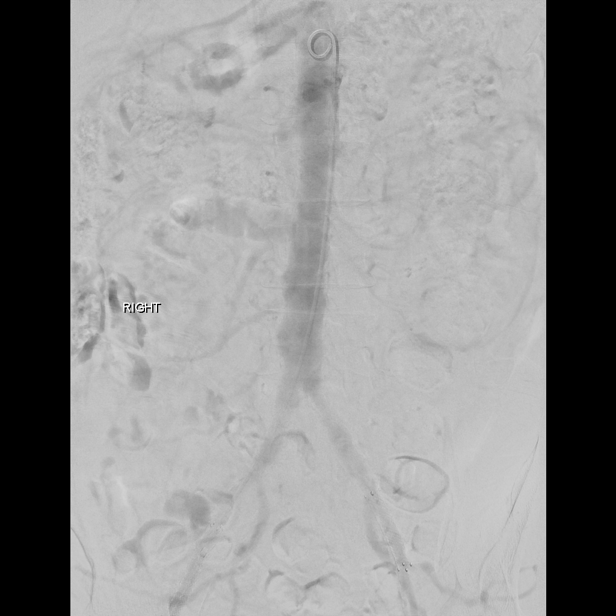

[Series 3: care aorta · 1 of 1 slices shown (2 of 9)]
[im 1/1]
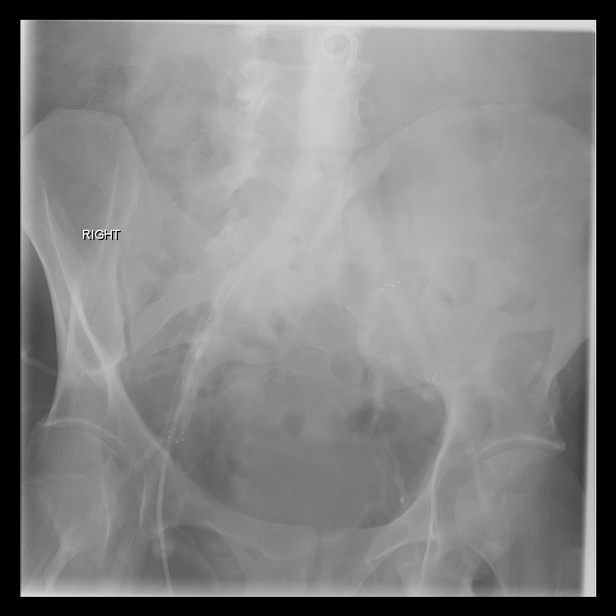

[Series 5: care sfa · 1 of 2 slices shown (1 of 3)]
[im 1/2]
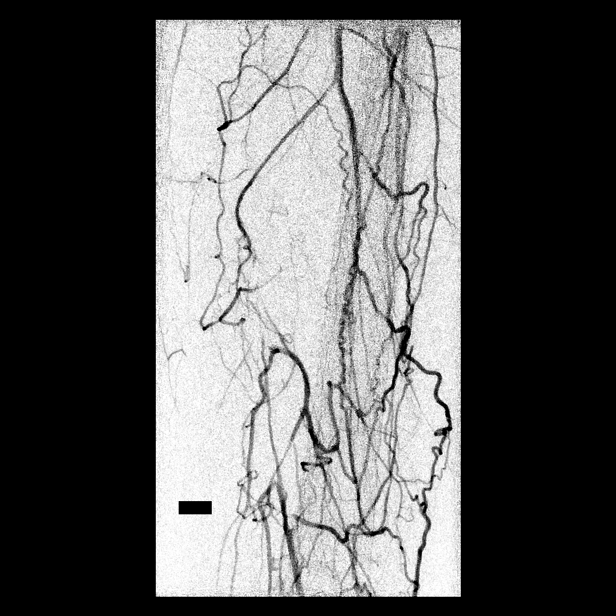

[Series 6: care sfa · 1 of 2 slices shown (2 of 3)]
[im 1/2]
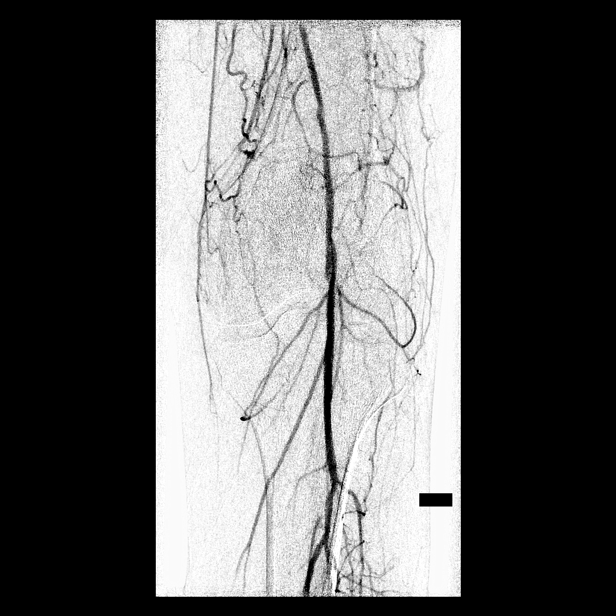

[Series 7: care sfa · 1 of 2 slices shown (3 of 3)]
[im 2/2]
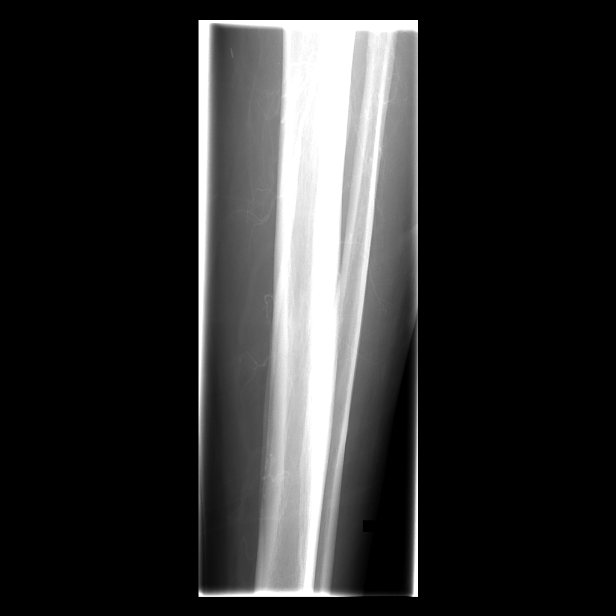

[Series 8: care aorta · 1 of 3 slices shown (3 of 9)]
[im 1/3]
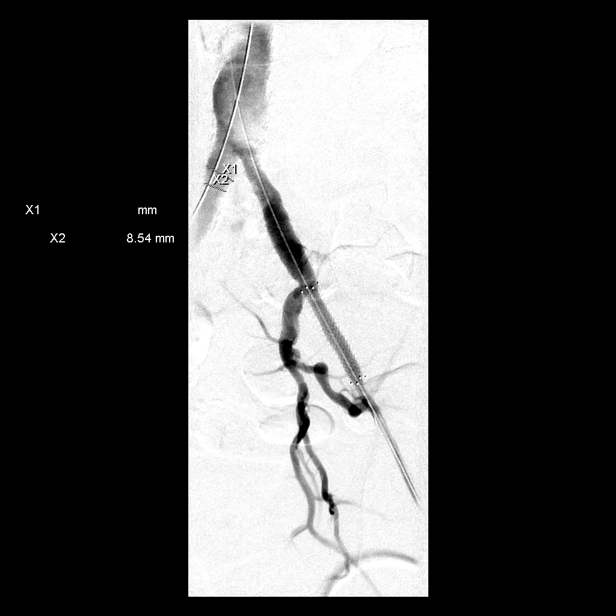

[Series 9: care aorta · 1 of 1 slices shown (4 of 9)]
[im 1/1]
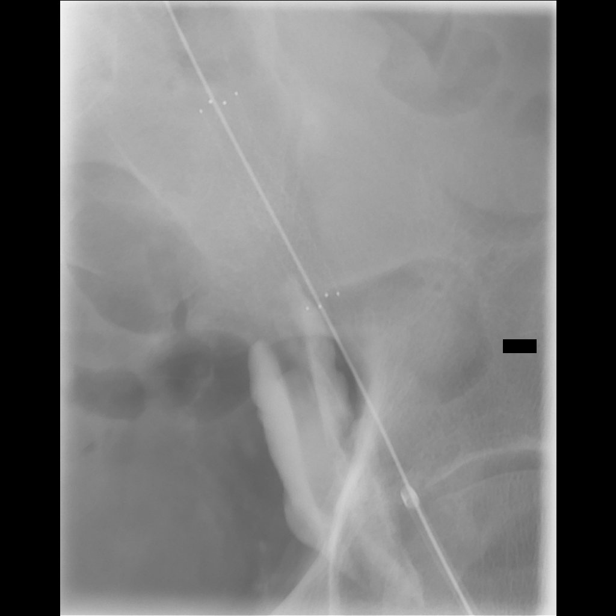

[Series 10: care aorta · 1 of 2 slices shown (5 of 9)]
[im 2/2]
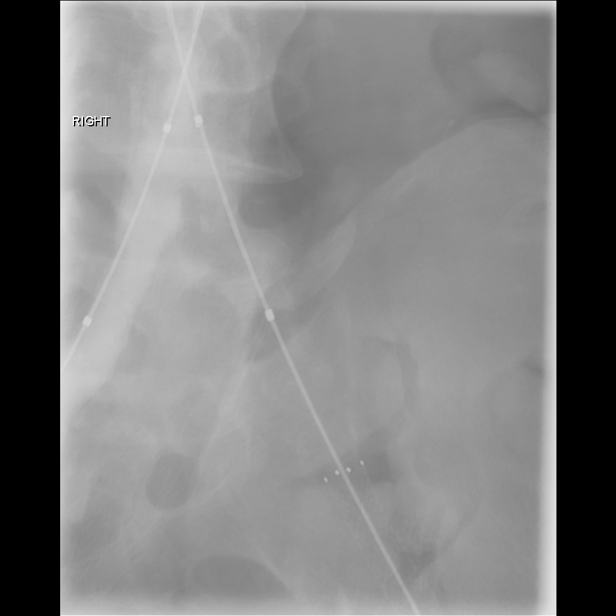

[Series 11: fl - angio · 1 of 1 slices shown (1 of 2)]
[im 1/1]
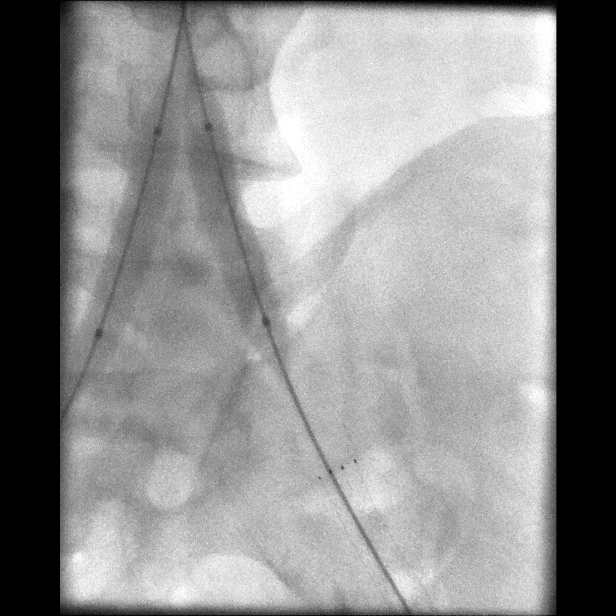

[Series 13: care aorta · 2 of 2 slices shown (6 of 9)]
[im 1/2]
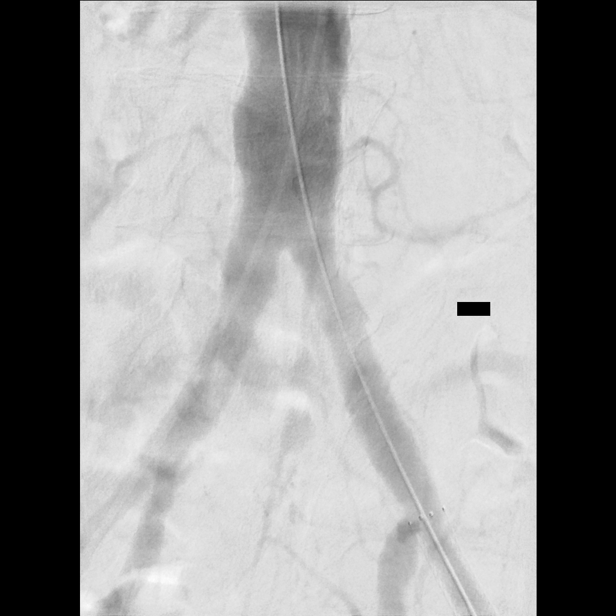
[im 2/2]
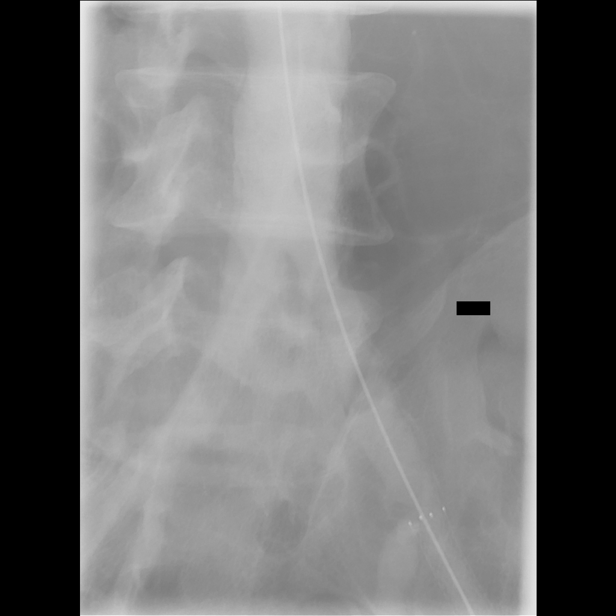

[Series 14: care aorta · 1 of 2 slices shown (7 of 9)]
[im 2/2]
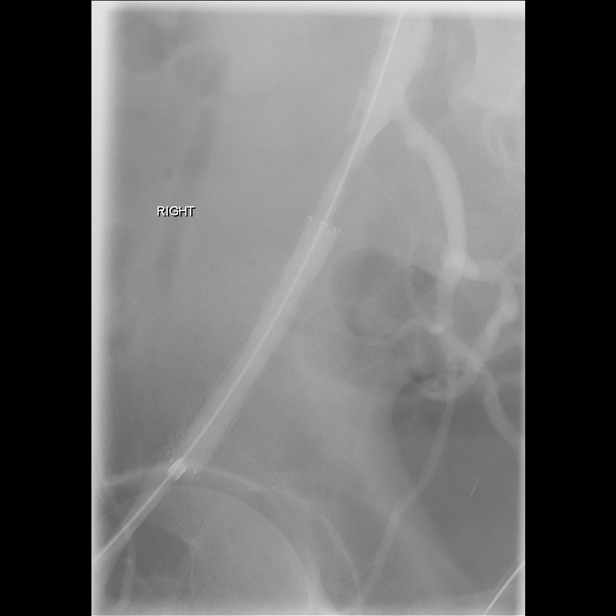

[Series 16: fl - angio · 1 of 1 slices shown (2 of 2)]
[im 1/1]
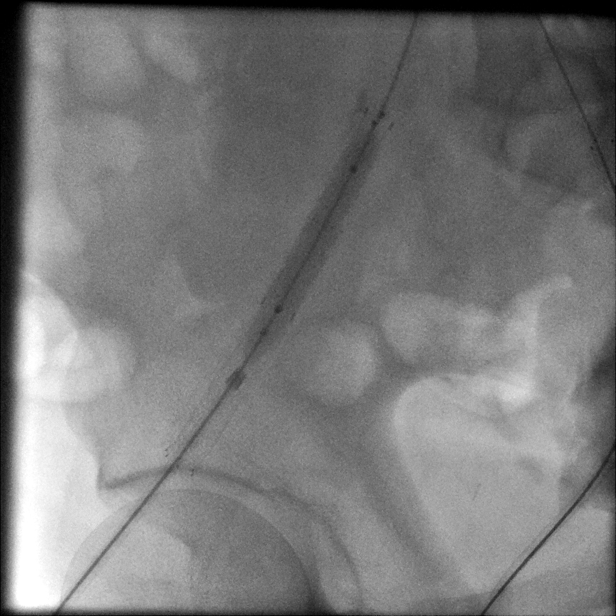

[Series 17: care aorta · 1 of 1 slices shown (8 of 9)]
[im 1/1]
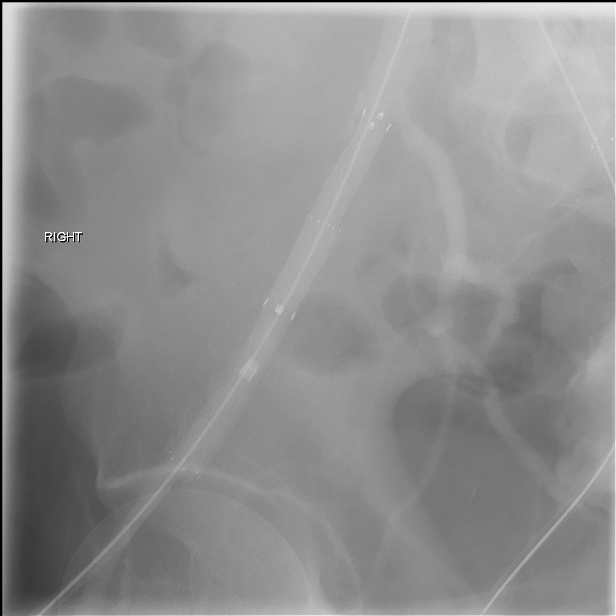

[Series 18: care aorta · 1 of 2 slices shown (9 of 9)]
[im 2/2]
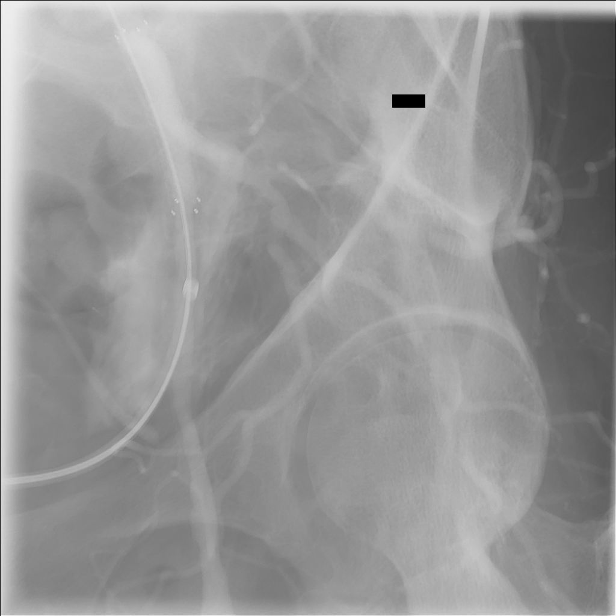

[15 of 24 positions shown; findings below may reference images not displayed]

IMAGES IMPORTED FROM THE [employer] WORKFLOW SYSTEM
NO DICTATION FOR STUDY

## 2012-12-27 IMAGING — XA IR VASCULAR PROCEDURE
15 of 24 series · 15 of 24 positions shown · non-contrast
Comparison: none

[Series 1: care aorta · 1 of 2 slices shown]
[im 1/2]
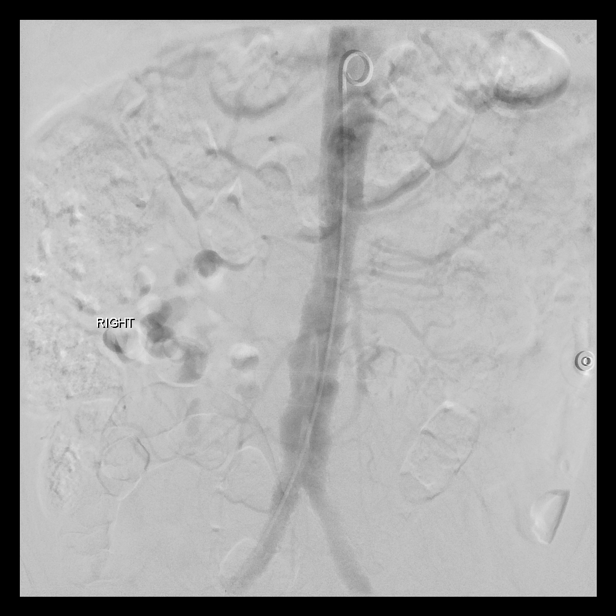

[Series 3: care iliacs · 1 of 2 slices shown]
[im 1/2]
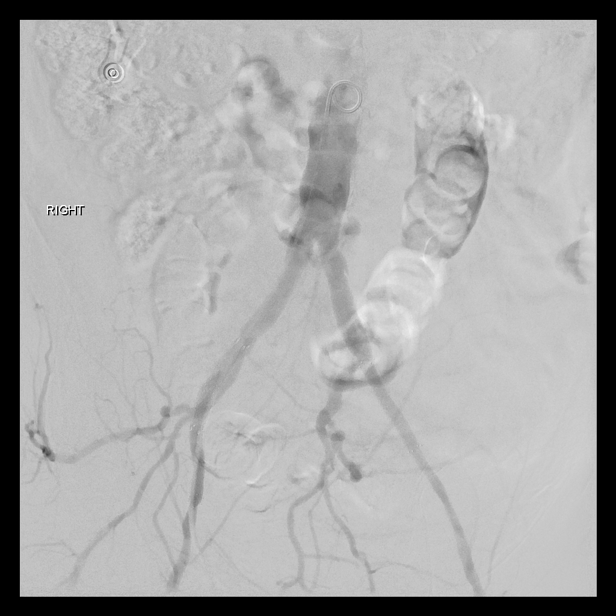

[Series 5: care sfa · 1 of 2 slices shown (1 of 9)]
[im 1/2]
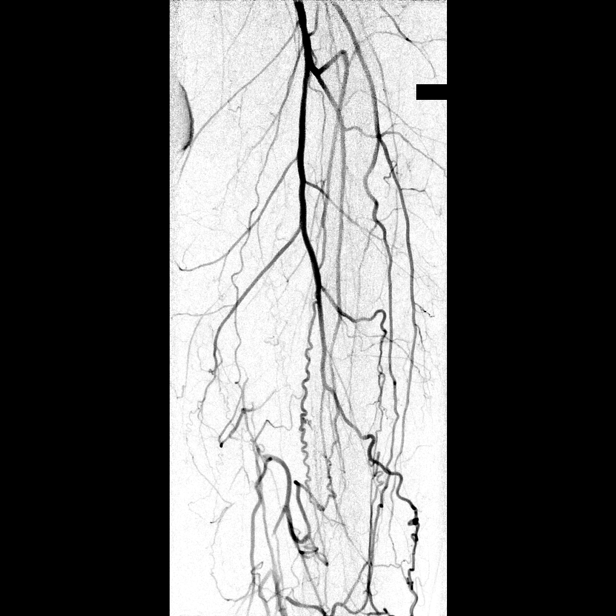

[Series 6: care sfa · 1 of 2 slices shown (2 of 9)]
[im 1/2]
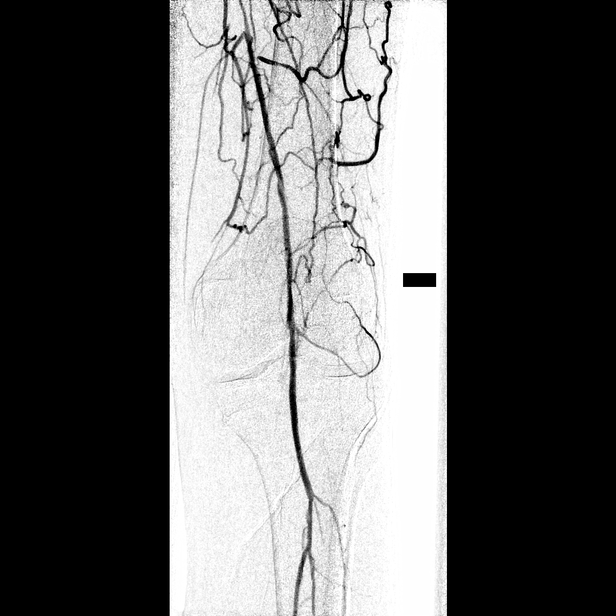

[Series 8: care sfa · 1 of 2 slices shown (3 of 9)]
[im 1/2]
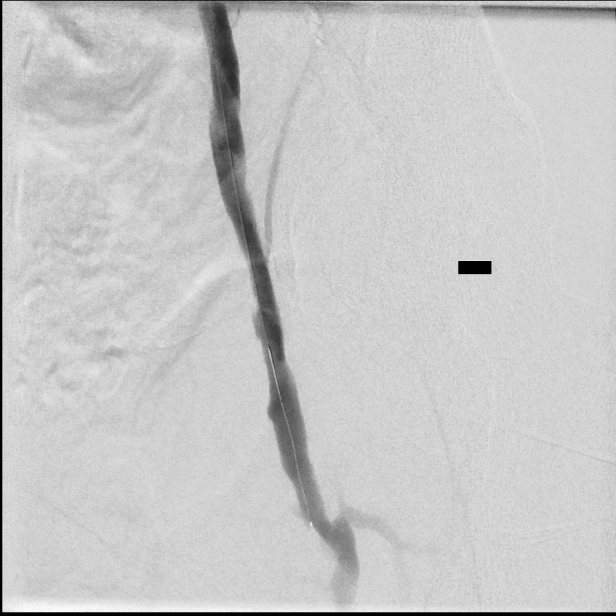

[Series 9: care sfa · 1 of 2 slices shown (4 of 9)]
[im 1/2]
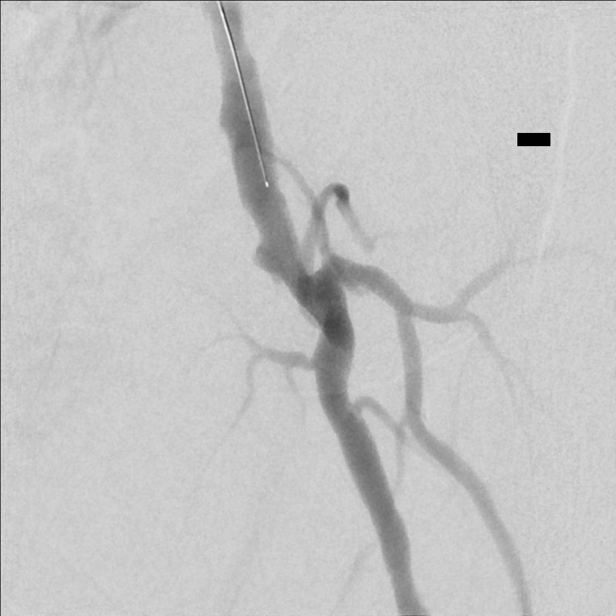

[Series 11: care sfa · 1 of 2 slices shown (5 of 9)]
[im 1/2]
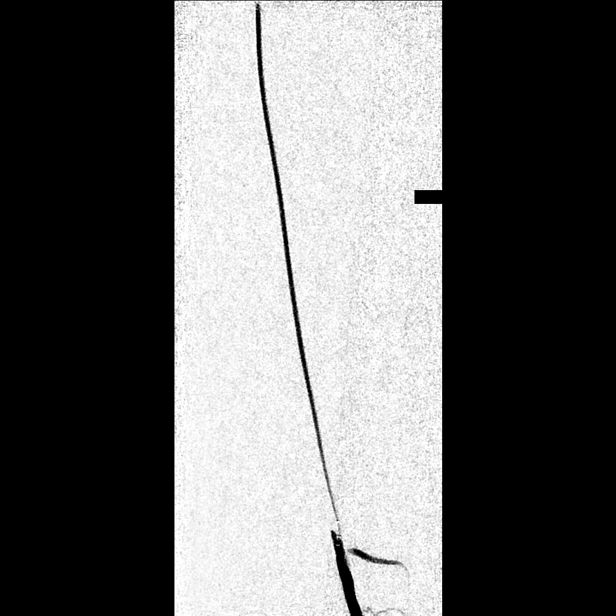

[Series 13: fl - angio · 1 of 1 slices shown (1 of 4)]
[im 1/1]
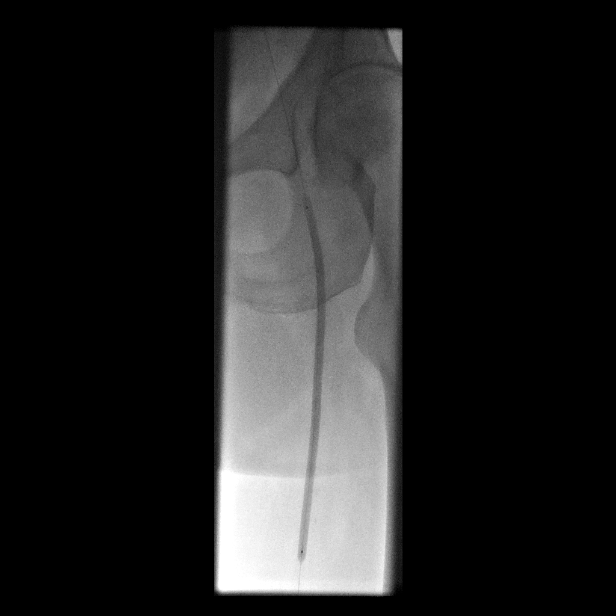

[Series 14: care sfa · 1 of 1 slices shown (6 of 9)]
[im 1/1]
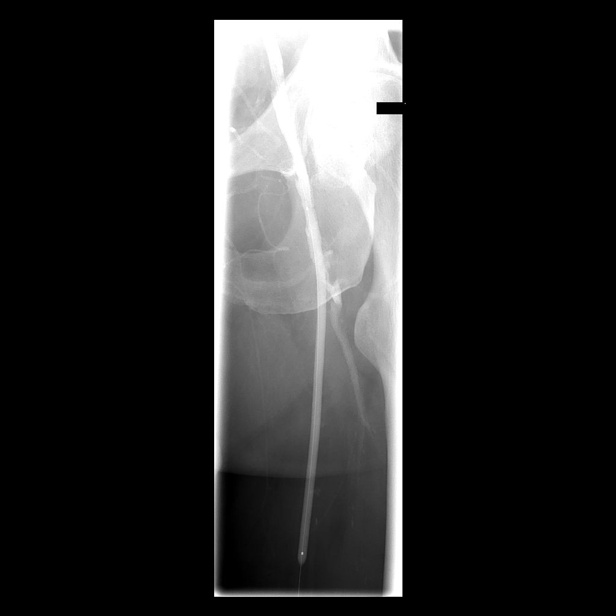

[Series 16: care sfa · 1 of 2 slices shown (7 of 9)]
[im 1/2]
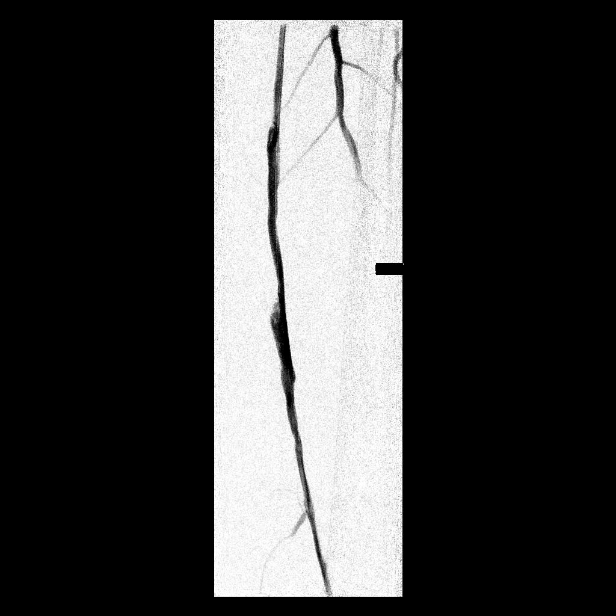

[Series 17: care sfa · 1 of 2 slices shown (8 of 9)]
[im 1/2]
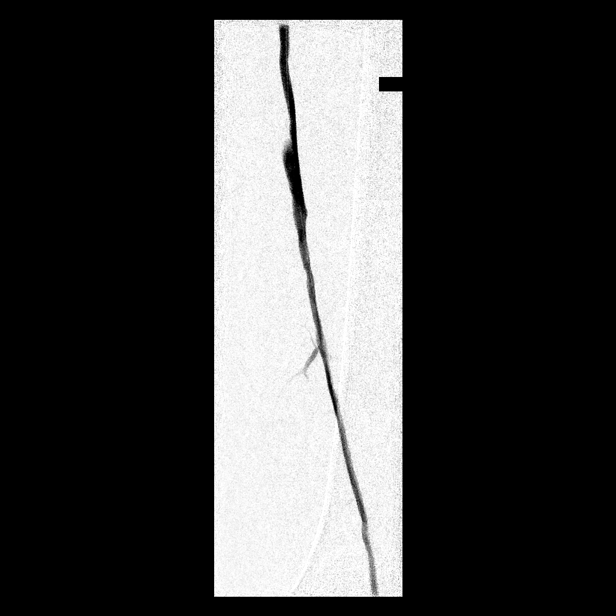

[Series 19: fl - angio · 1 of 1 slices shown (2 of 4)]
[im 1/1]
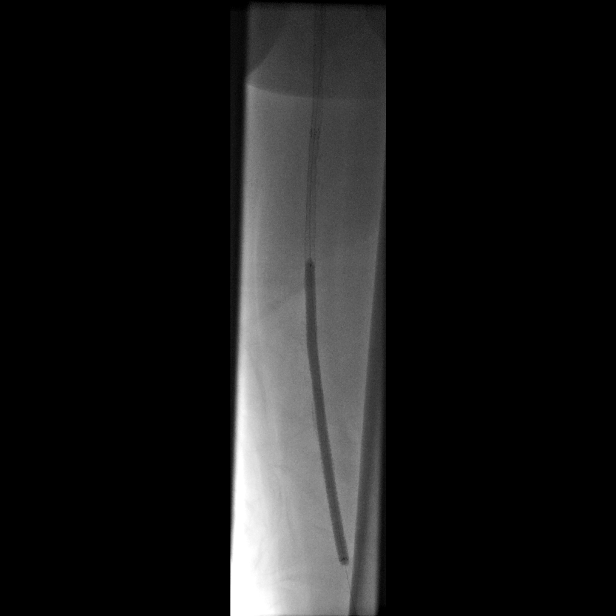

[Series 21: fl - angio · 1 of 1 slices shown (3 of 4)]
[im 1/1]
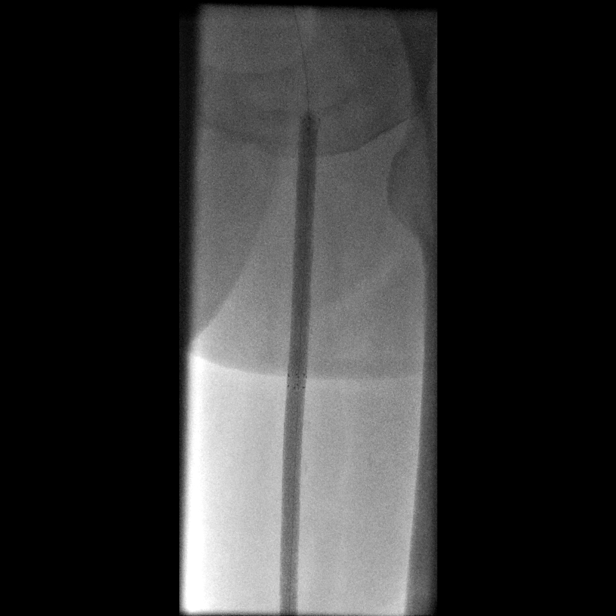

[Series 22: fl - angio · 1 of 1 slices shown (4 of 4)]
[im 1/1]
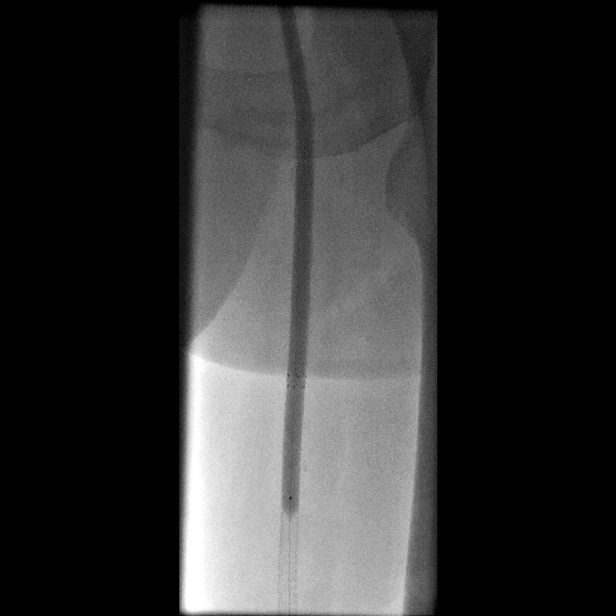

[Series 24: care sfa · 1 of 2 slices shown (9 of 9)]
[im 1/2]
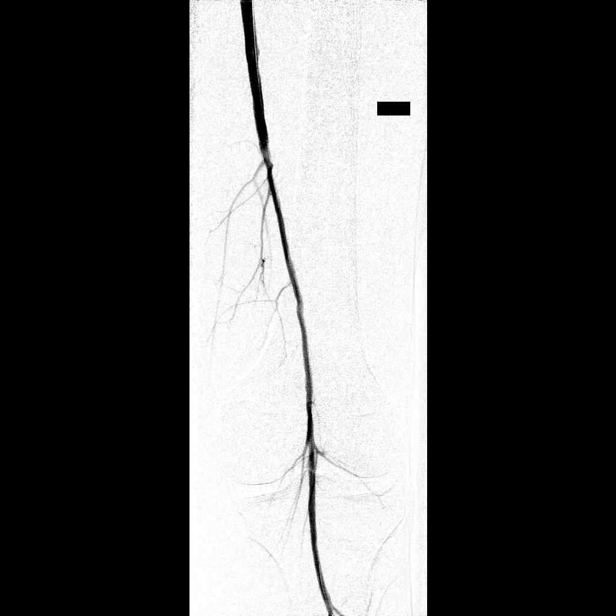

[15 of 24 positions shown; findings below may reference images not displayed]

IMAGES IMPORTED FROM THE [employer] WORKFLOW SYSTEM
NO DICTATION FOR STUDY

## 2013-01-04 ENCOUNTER — Observation Stay: Payer: Self-pay | Admitting: Internal Medicine

## 2013-01-04 LAB — BASIC METABOLIC PANEL
Calcium, Total: 8.9 mg/dL (ref 8.5–10.1)
EGFR (Non-African Amer.): 12 — ABNORMAL LOW
Potassium: 4.3 mmol/L (ref 3.5–5.1)

## 2013-01-04 LAB — CK TOTAL AND CKMB (NOT AT ARMC)
CK, Total: 114 U/L (ref 35–232)
CK-MB: 1.3 ng/mL (ref 0.5–3.6)
CK-MB: 1.1 ng/mL (ref 0.5–3.6)

## 2013-01-04 LAB — CBC
HCT: 34.5 % — ABNORMAL LOW (ref 40.0–52.0)
MCH: 32.3 pg (ref 26.0–34.0)
MCHC: 34.4 g/dL (ref 32.0–36.0)
MCV: 94 fL (ref 80–100)
Platelet: 215 10*3/uL (ref 150–440)
RDW: 13.6 % (ref 11.5–14.5)

## 2013-01-04 LAB — T4, FREE: Free Thyroxine: 0.89 ng/dL (ref 0.76–1.46)

## 2013-01-04 LAB — HEPATIC FUNCTION PANEL A (ARMC)
Bilirubin, Direct: <0.1 mg/dL (ref 0.00–0.20)
SGOT(AST): 20 U/L (ref 15–37)
SGPT (ALT): 22 U/L (ref 12–78)
Total Protein: 6.9 g/dL (ref 6.4–8.2)

## 2013-01-04 LAB — LIPASE, BLOOD: Lipase: 126 U/L (ref 73–393)

## 2013-01-04 LAB — TROPONIN I: Troponin-I: <0.02 ng/mL

## 2013-01-04 LAB — MAGNESIUM: Magnesium: 1.7 mg/dL — ABNORMAL LOW

## 2013-01-05 LAB — COMPREHENSIVE METABOLIC PANEL
Albumin: 3.3 g/dL — ABNORMAL LOW (ref 3.4–5.0)
Alkaline Phosphatase: 112 U/L (ref 50–136)
Anion Gap: 8 (ref 7–16)
BUN: 34 mg/dL — ABNORMAL HIGH (ref 7–18)
Calcium, Total: 9 mg/dL (ref 8.5–10.1)
Chloride: 105 mmol/L (ref 98–107)
Co2: 25 mmol/L (ref 21–32)
EGFR (African American): 13 — ABNORMAL LOW
Glucose: 94 mg/dL (ref 65–99)
Potassium: 4.2 mmol/L (ref 3.5–5.1)
SGPT (ALT): 19 U/L (ref 12–78)
Total Protein: 6.6 g/dL (ref 6.4–8.2)

## 2013-01-05 LAB — CBC WITH DIFFERENTIAL/PLATELET
Basophil %: 0.5 %
Eosinophil %: 2.1 %
Eosinophil %: 1.6 %
HCT: 34.6 % — ABNORMAL LOW (ref 40.0–52.0)
HCT: 35.5 % — ABNORMAL LOW (ref 40.0–52.0)
HGB: 11.5 g/dL — ABNORMAL LOW (ref 13.0–18.0)
HGB: 11.8 g/dL — ABNORMAL LOW (ref 13.0–18.0)
Lymphocyte #: 1.4 10*3/uL (ref 1.0–3.6)
Lymphocyte #: 1.6 10*3/uL (ref 1.0–3.6)
Lymphocyte %: 18.4 %
Lymphocyte %: 21.4 %
MCHC: 33.4 g/dL (ref 32.0–36.0)
MCHC: 33.2 g/dL (ref 32.0–36.0)
MCV: 95 fL (ref 80–100)
MCV: 95 fL (ref 80–100)
Monocyte #: 0.6 x10 3/mm (ref 0.2–1.0)
Monocyte %: 8.1 %
Monocyte %: 7.4 %
Neutrophil %: 70.8 %
Neutrophil %: 69.1 %
Platelet: 215 10*3/uL (ref 150–440)
RBC: 3.74 10*6/uL — ABNORMAL LOW (ref 4.40–5.90)
RDW: 13.5 % (ref 11.5–14.5)

## 2013-01-06 LAB — HEMOGLOBIN A1C: Hemoglobin A1C: 4.8 % (ref 4.2–6.3)

## 2013-01-24 IMAGING — XA IR VASCULAR PROCEDURE
14 of 20 series · 15 of 24 positions shown · non-contrast
Comparison: none

[Series 1: care sfa · 1 of 2 slices shown (1 of 12)]
[im 1/2]
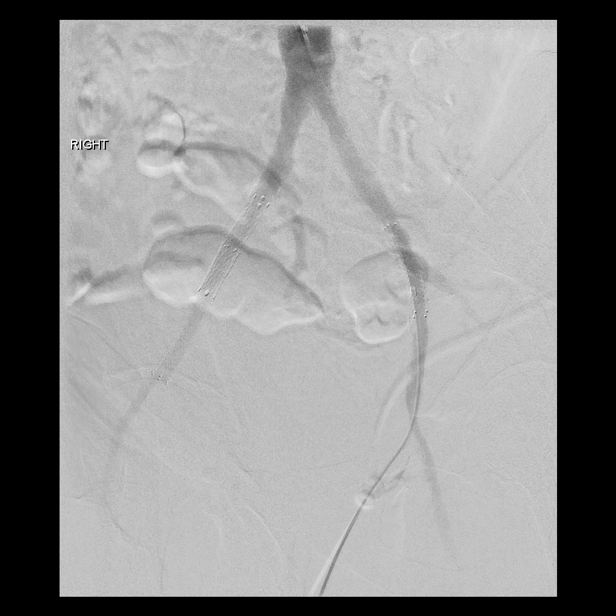

[Series 2: care sfa · 1 of 2 slices shown (2 of 12)]
[im 1/2]
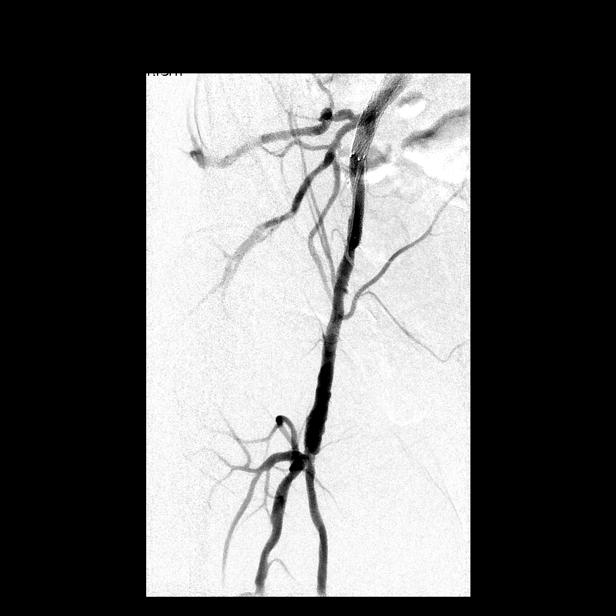

[Series 3: care sfa · 2 of 2 slices shown (3 of 12)]
[im 1/2]
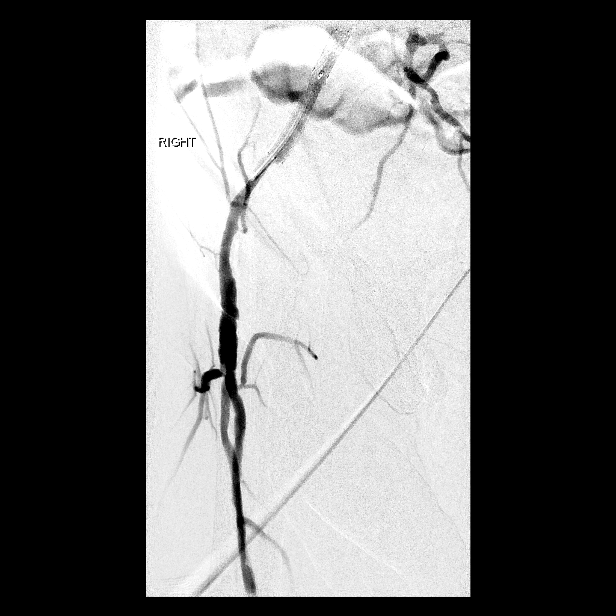
[im 2/2]
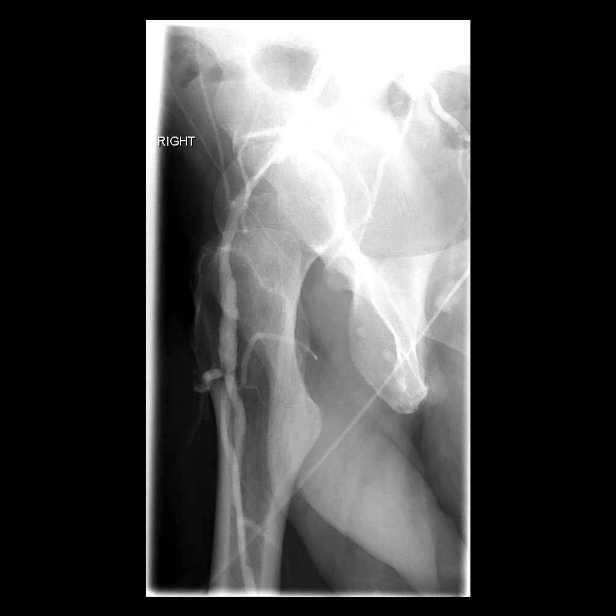

[Series 4: care sfa · 1 of 2 slices shown (4 of 12)]
[im 2/2]
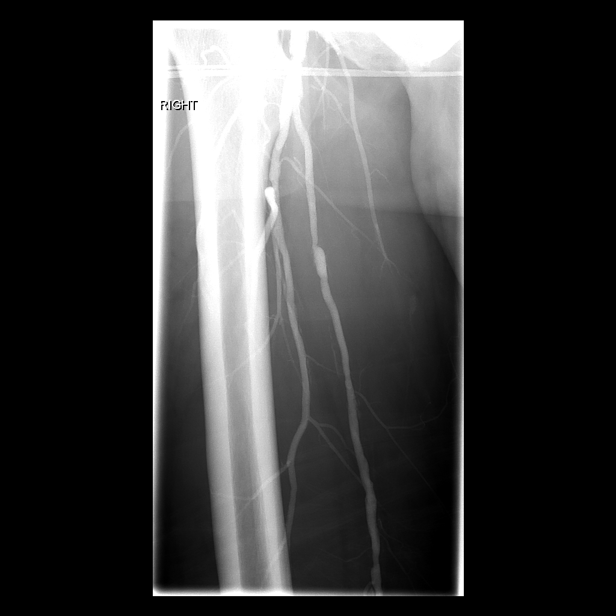

[Series 5: care sfa · 1 of 2 slices shown (5 of 12)]
[im 1/2]
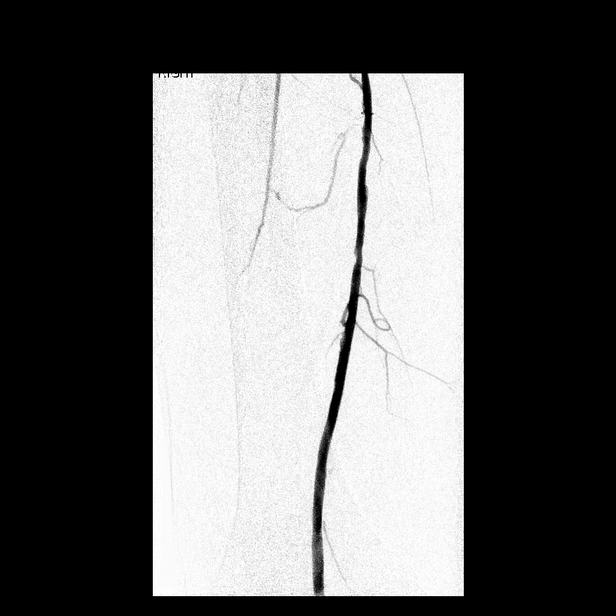

[Series 7: care sfa · 1 of 2 slices shown (6 of 12)]
[im 1/2]
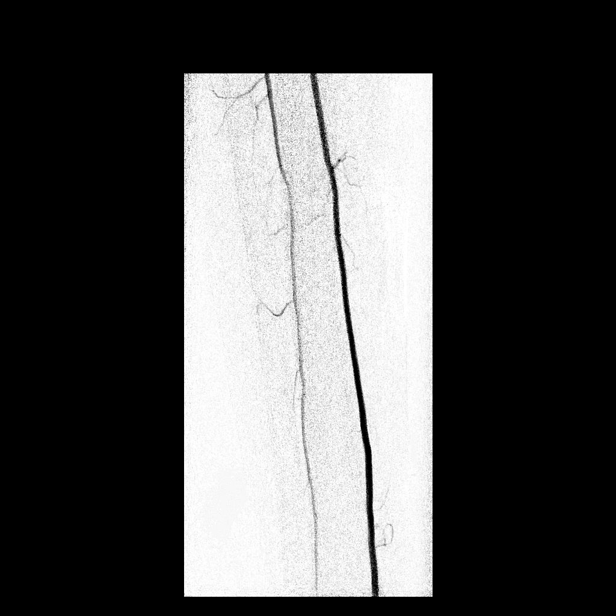

[Series 9: fl - angio · 1 of 1 slices shown (1 of 2)]
[im 1/1]
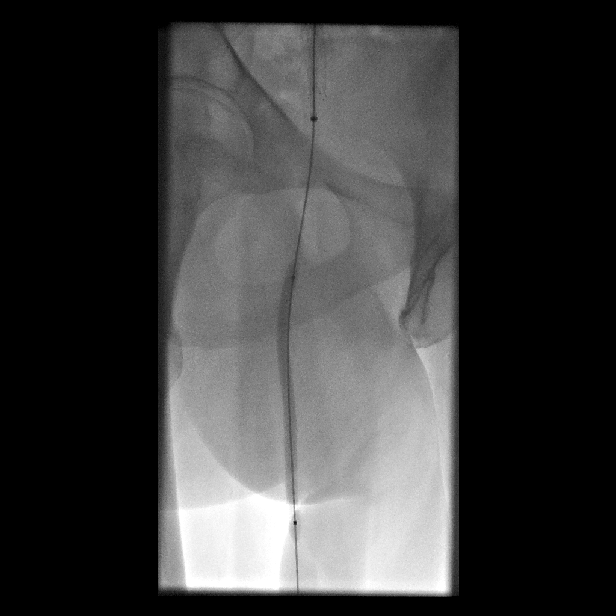

[Series 10: care sfa · 1 of 2 slices shown (7 of 12)]
[im 1/2]
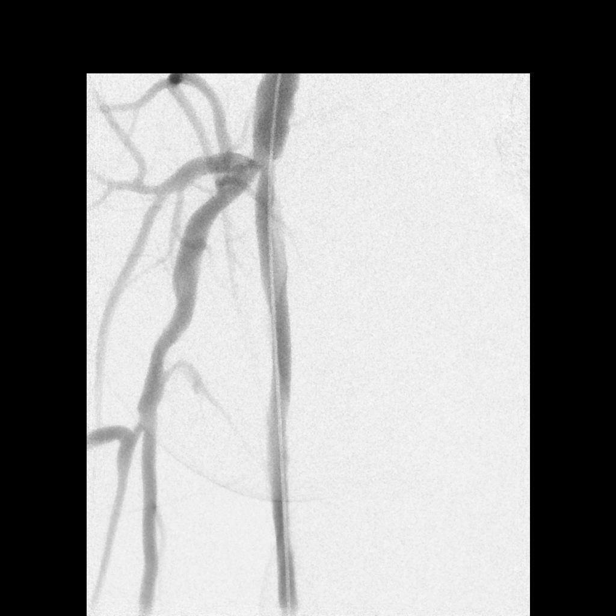

[Series 12: care sfa · 1 of 2 slices shown (8 of 12)]
[im 1/2]
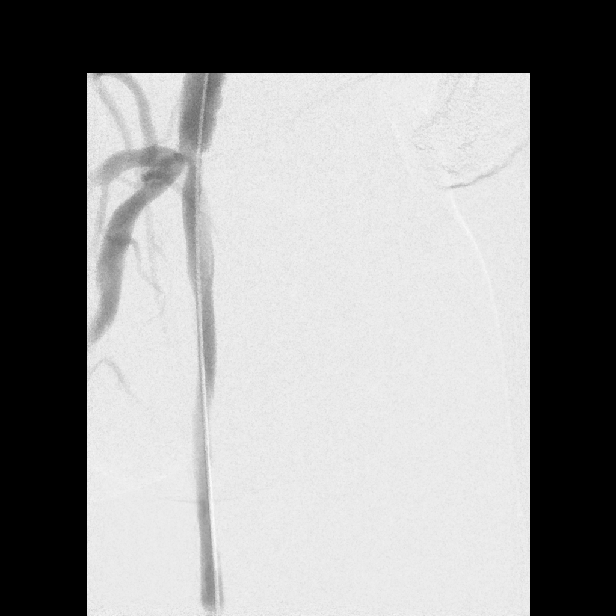

[Series 13: fl - angio · 1 of 1 slices shown (2 of 2)]
[im 1/1]
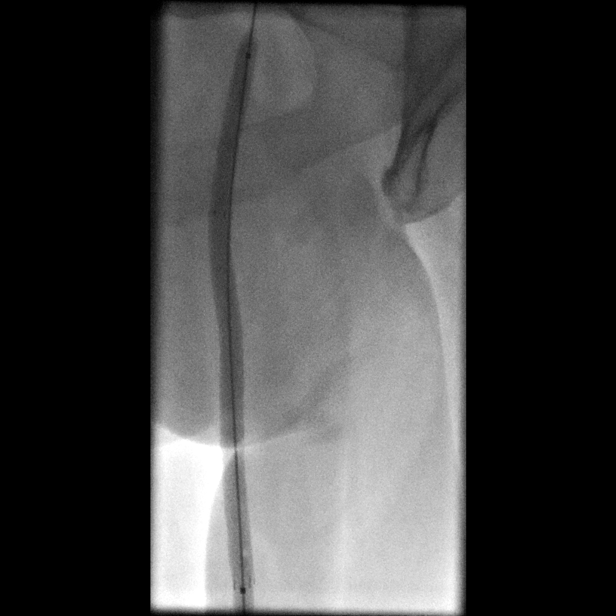

[Series 15: care sfa · 1 of 2 slices shown (9 of 12)]
[im 1/2]
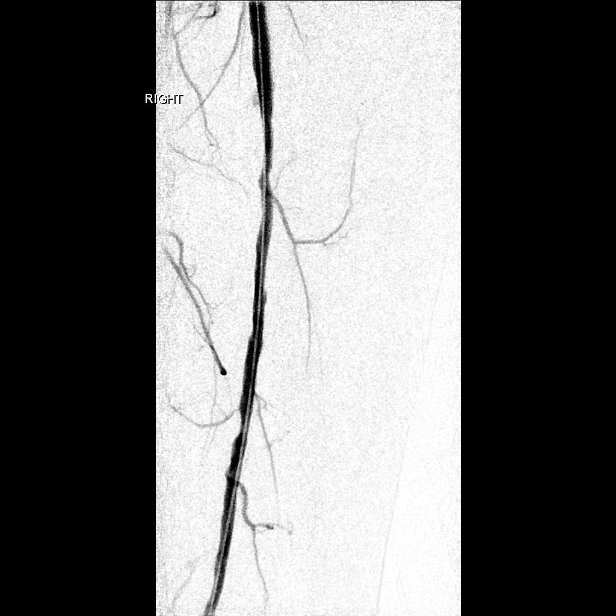

[Series 17: care sfa · 1 of 2 slices shown (10 of 12)]
[im 1/2]
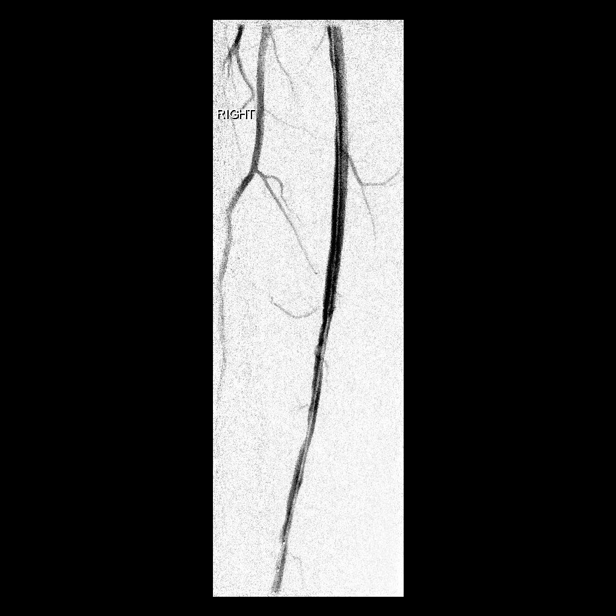

[Series 18: care sfa · 1 of 2 slices shown (11 of 12)]
[im 1/2]
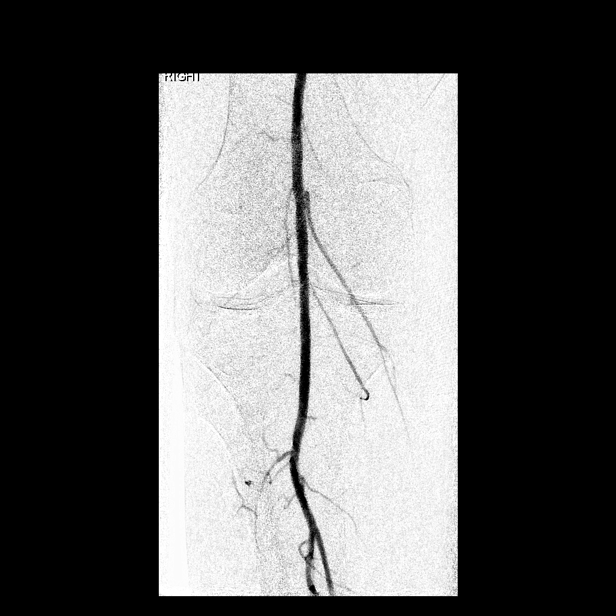

[Series 20: care sfa · 1 of 2 slices shown (12 of 12)]
[im 1/2]
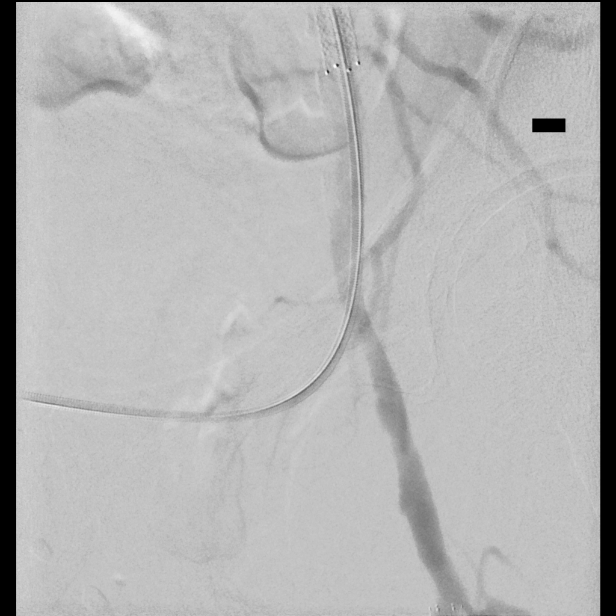

[15 of 24 positions shown; findings below may reference images not displayed]

IMAGES IMPORTED FROM THE [employer] WORKFLOW SYSTEM
NO DICTATION FOR STUDY

## 2013-05-13 IMAGING — CR DG CHEST 2V
1 series · 2 of 2 positions shown · non-contrast
Comparison: none

REASON FOR EXAM: cp
COMMENTS:   May transport without cardiac monitor

PROCEDURE:     DXR - DXR CHEST PA (OR AP) AND LATERAL  - June 18, 2011  [DATE]
RESULT:     Comparison: 10/31/2010

[Series 1: w chest pa · 0.14mm/px · 2 of 2 slices shown]
[im 1/2]
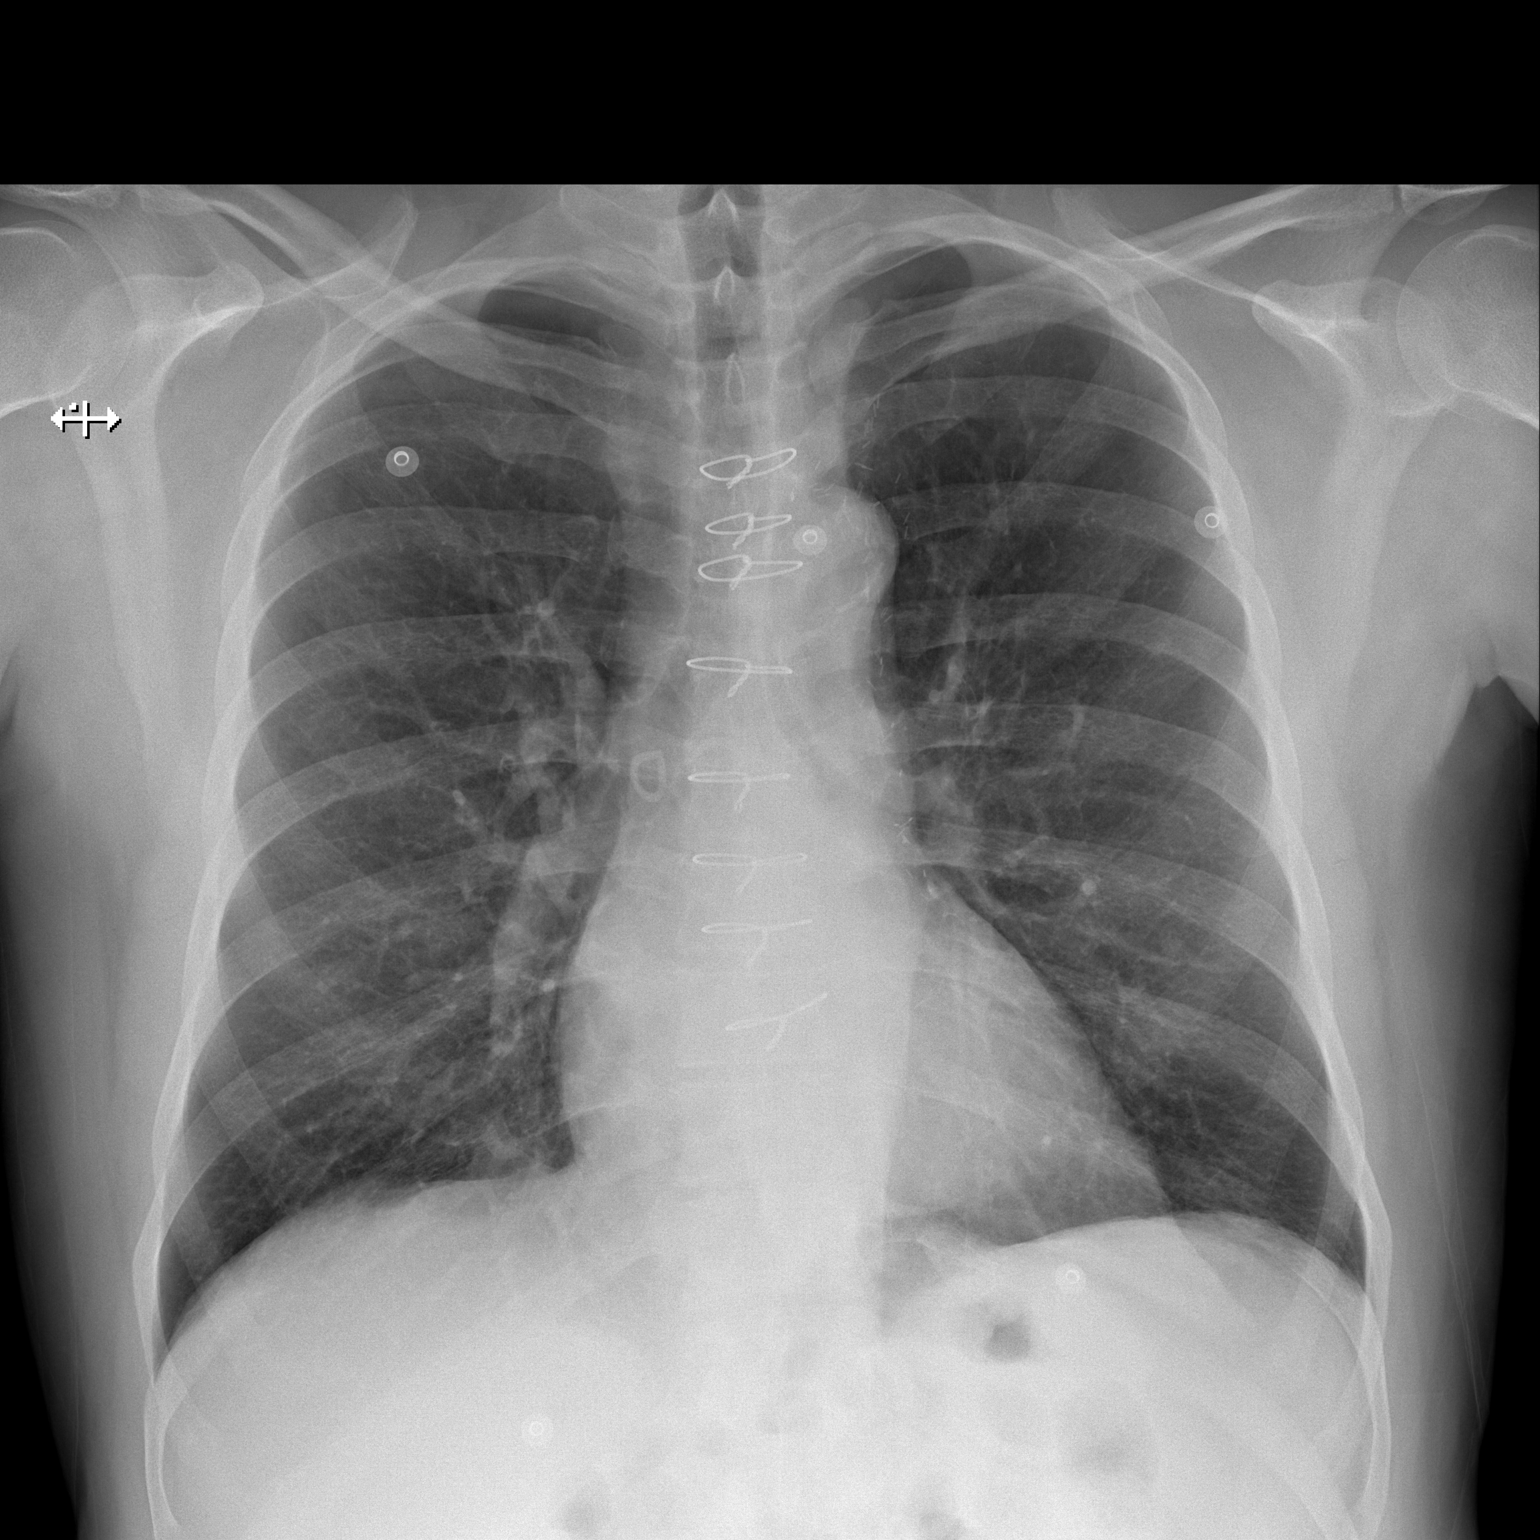
[im 2/2]
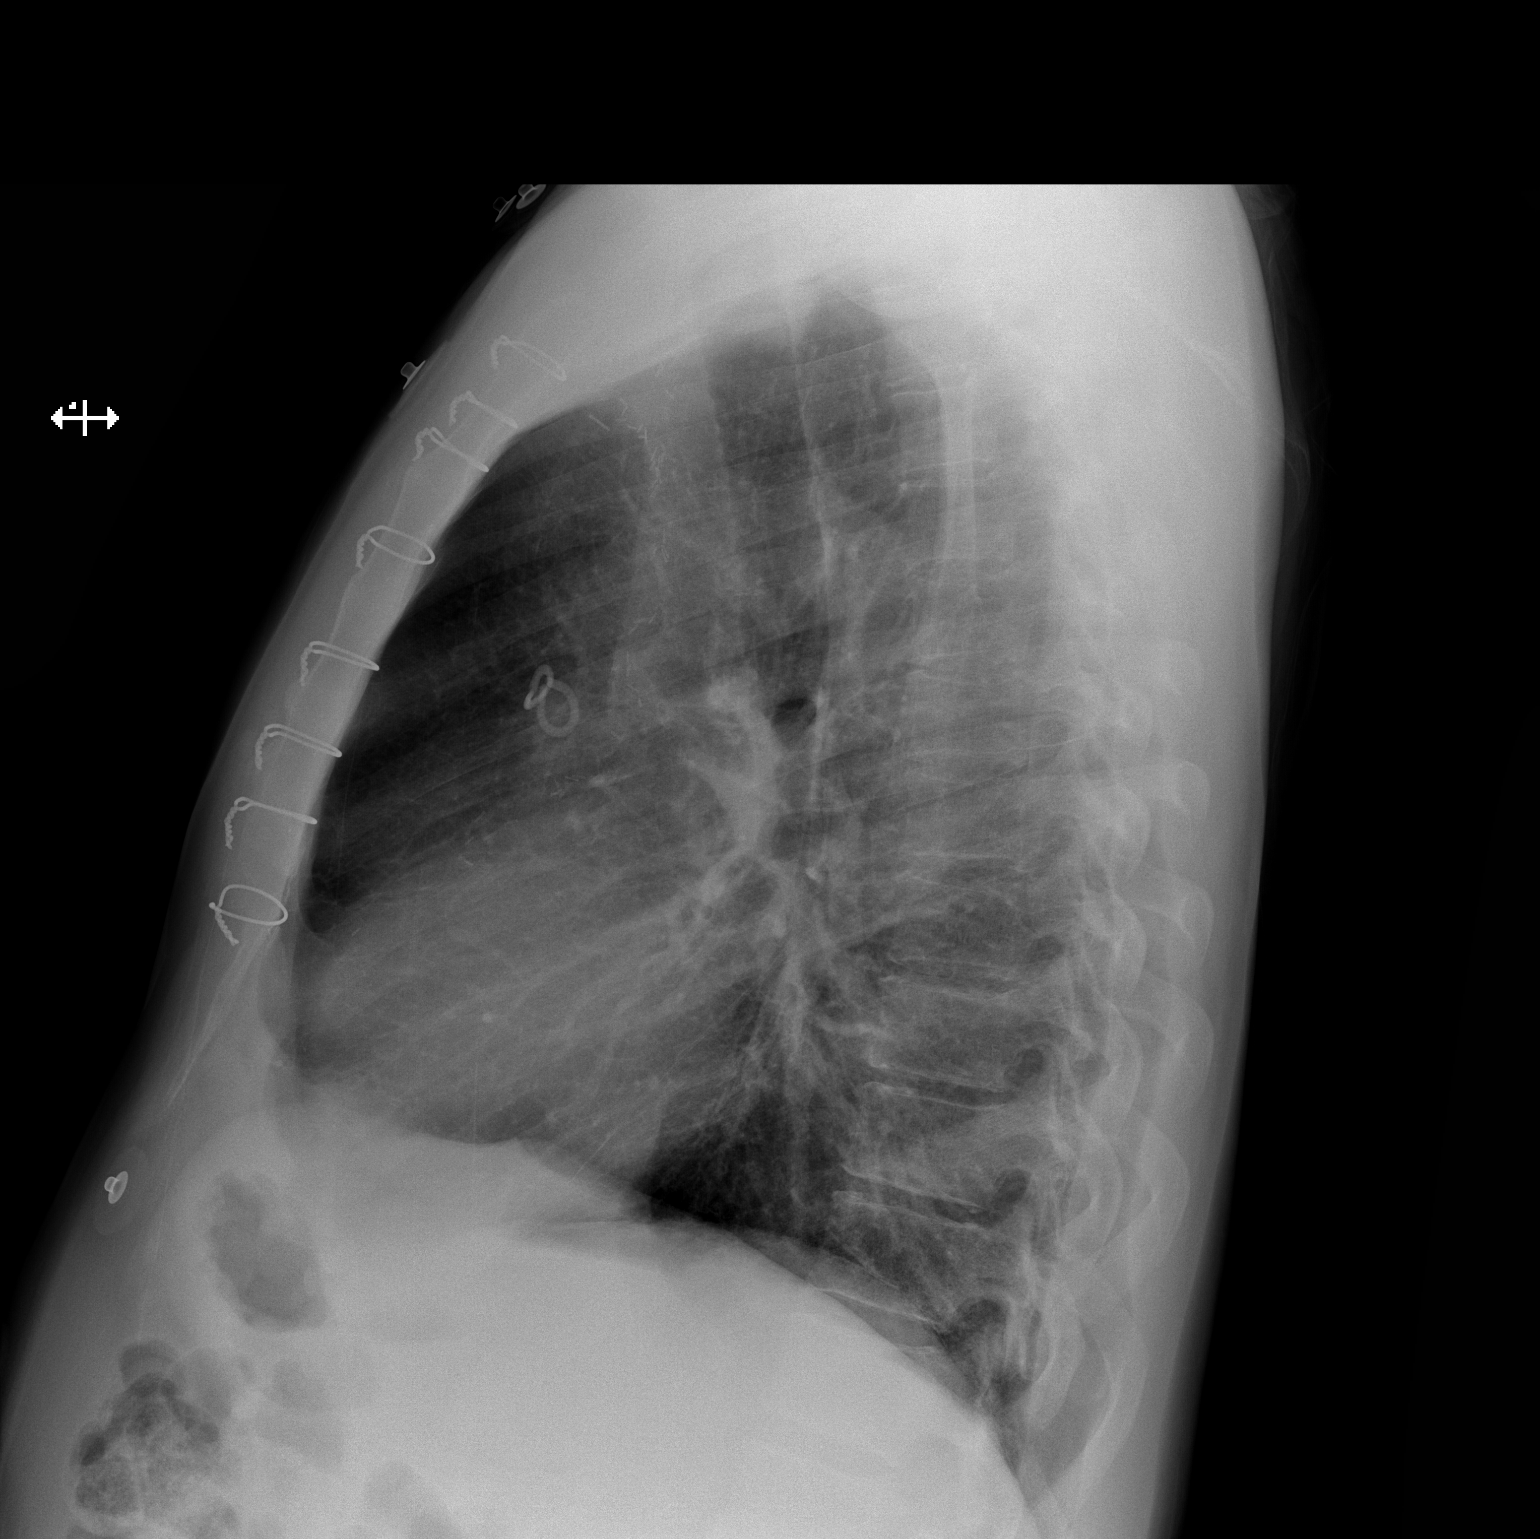

[2 of 2 positions shown; findings below may reference images not displayed]

FINDINGS: The heart and mediastinum are stable. Prior median sternotomy and CABG. No
focal pulmonary opacities.
IMPRESSION: No acute cardiopulmonary disease.

## 2013-05-13 IMAGING — US US EXTREM LOW VENOUS BILAT
1 series · 17 of 24 positions shown · non-contrast
Comparison: none

REASON FOR EXAM: pleuritic chest pain, ESKD
COMMENTS:

PROCEDURE:     US  - US DOPPLER LOW EXTR BILATERAL  - June 18, 2011  [DATE]
RESULT:     Comparison: None

[Series 1: us extrem low venous bilat · 17 of 47 slices shown]
[im 1/47]
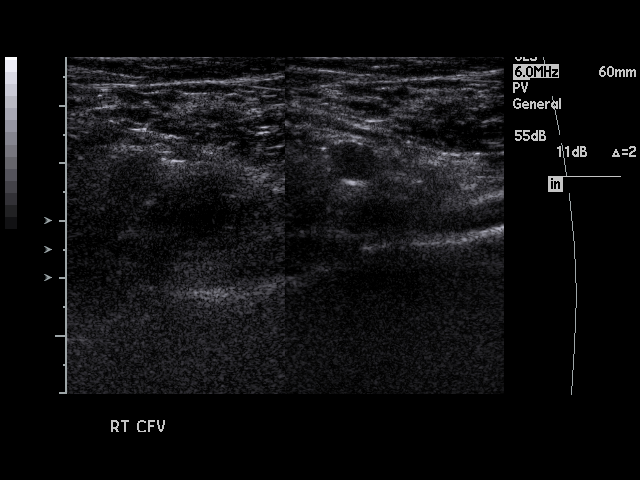
[im 5/47]
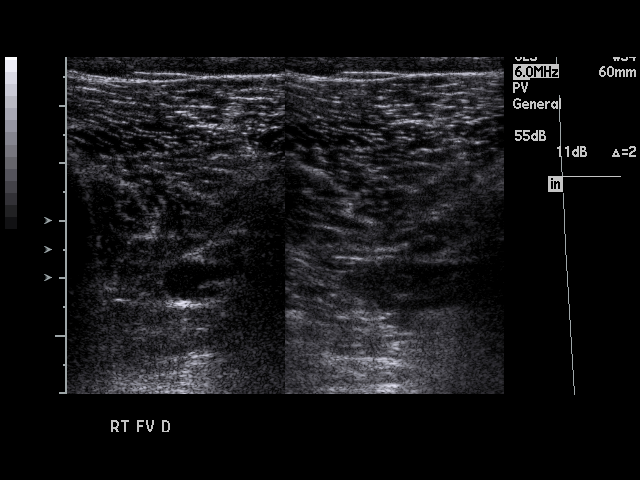
[im 7/47]
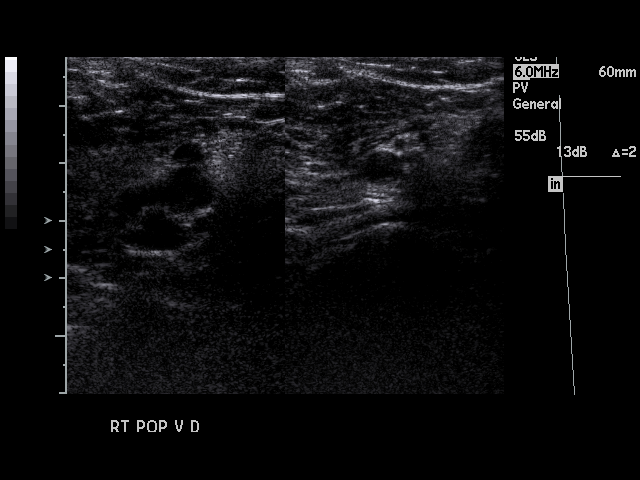
[im 9/47]
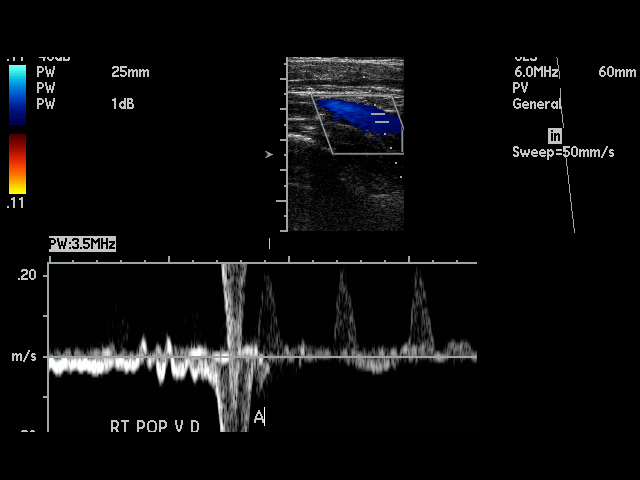
[im 13/47]
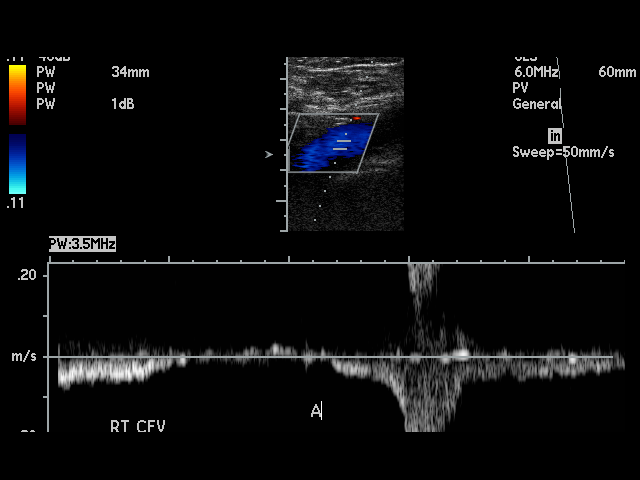
[im 15/47]
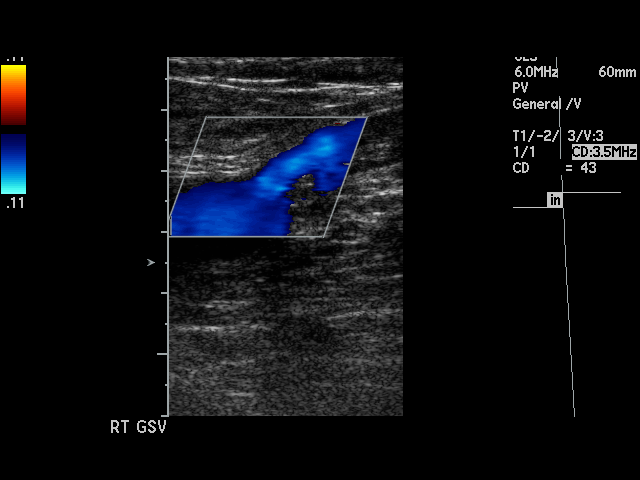
[im 19/47]
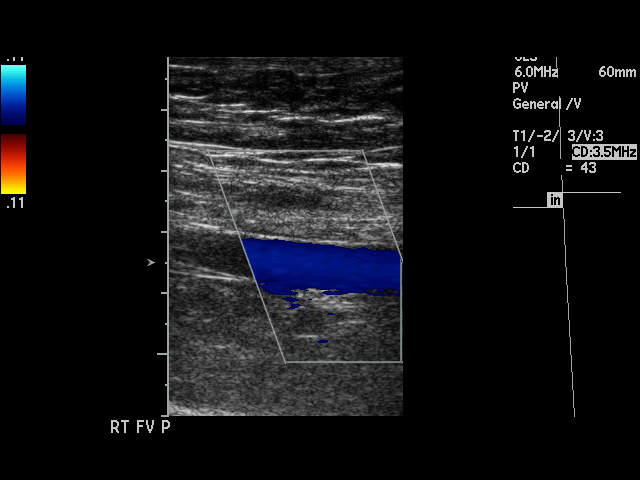
[im 21/47]
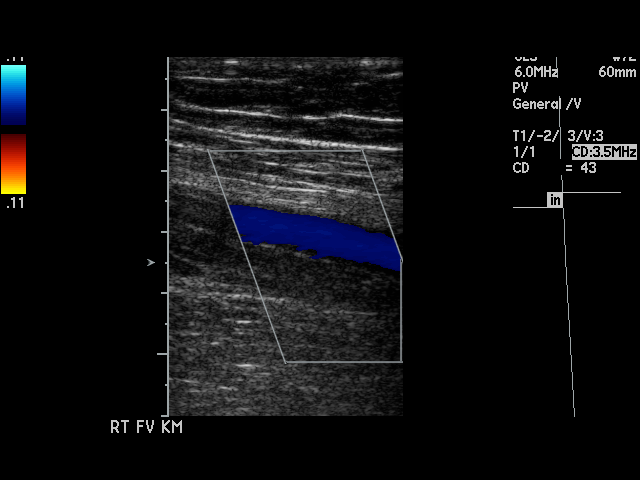
[im 25/47]
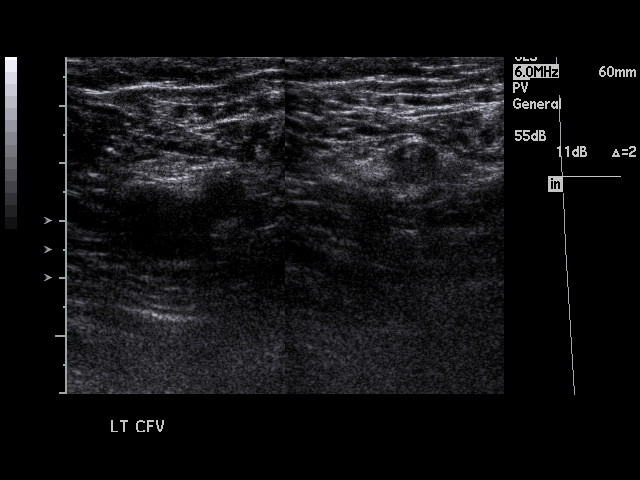
[im 27/47]
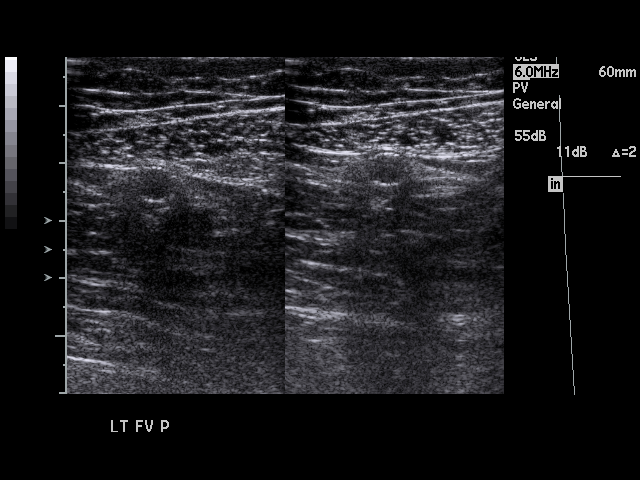
[im 29/47]
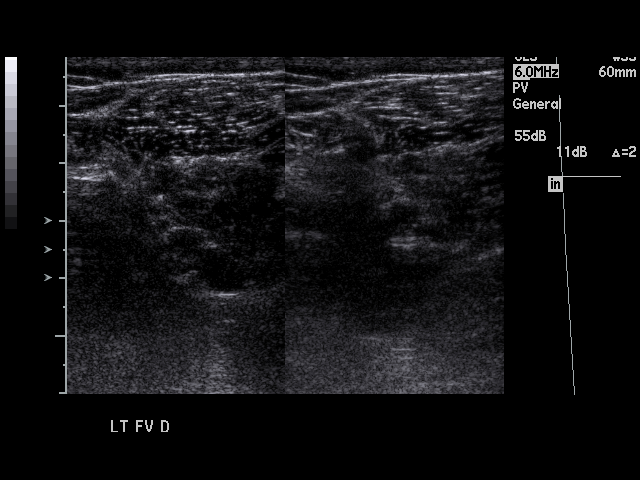
[im 33/47]
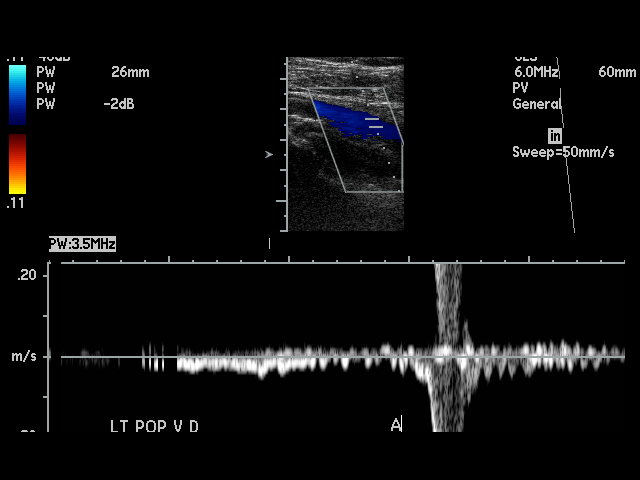
[im 35/47]
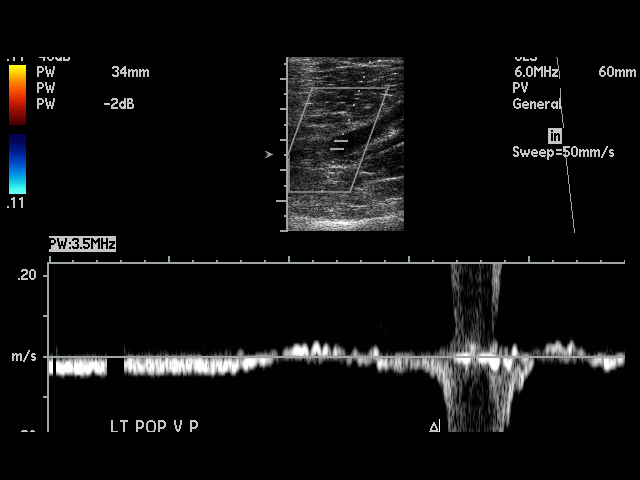
[im 39/47]
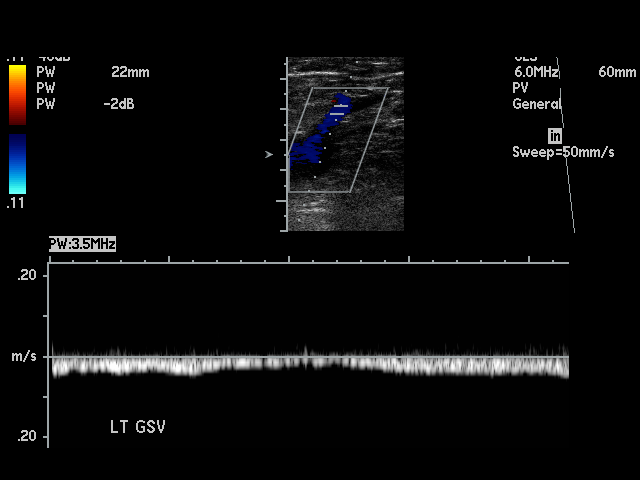
[im 41/47]
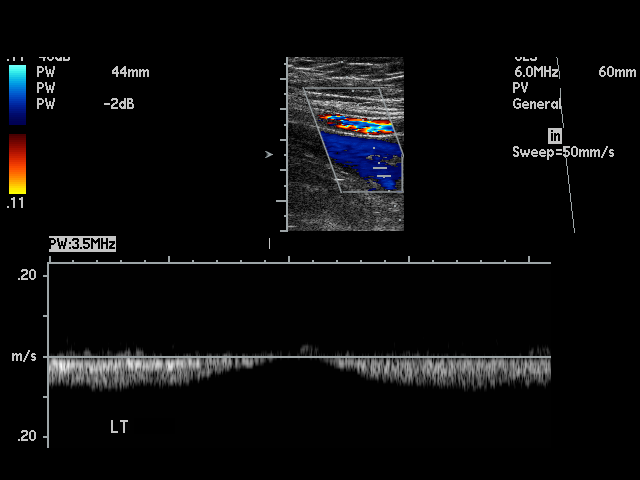
[im 43/47]
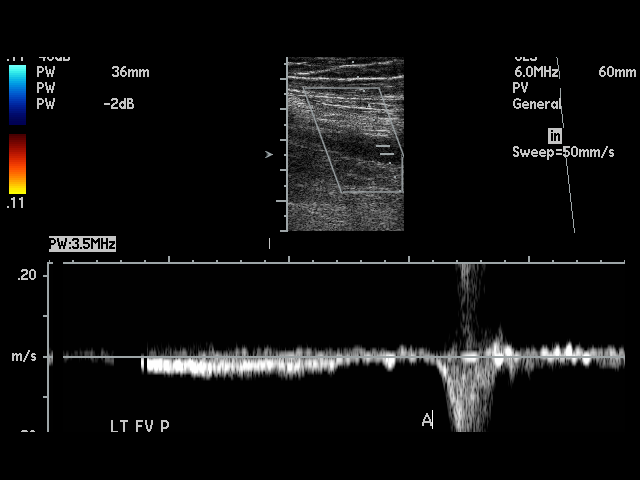
[im 47/47]
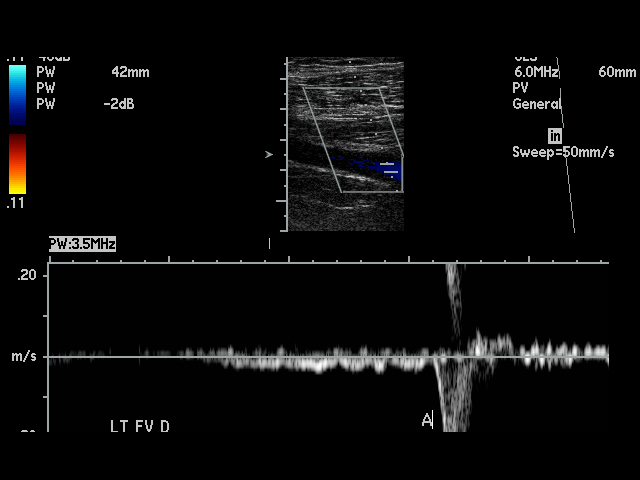

[17 of 24 positions shown; findings below may reference images not displayed]

FINDINGS: Multiple longitudinal and transverse gray-scale as well as color
and spectral Doppler images of the bilateral lower extremity veins were
obtained from the common femoral veins through the popliteal veins.

The bilateral common femoral, femoral, and popliteal veins are patent,
demonstrating normal color-flow and compressibility. No intraluminal
thrombus is identified.There is normal respiratory variation and
augmentation demonstrated at all vein levels.
IMPRESSION: No evidence of DVT in the bilateral lower extremities.

## 2013-05-14 IMAGING — NM NM LUNG SCAN
2 series · 16 of 16 positions shown · non-contrast
Comparison: none

REASON FOR EXAM: leuritic chest pain,  chronic kidney disease
COMMENTS:

PROCEDURE:     NM  - NM VQ LUNG SCAN  - [DATE]  [DATE] [DATE]  [DATE]
RESULT:     Comparison: Chest radiograph 06/18/2011
Radiopharmaceutical: 41.81 mCi Gc-HHm DTPA inhaled gas via a nebulizer for
ventilation; 4.2 mCi Gc-HHm MAA administered intravenously for perfusion.
TECHNIQUE: Standard oblique, anterior and posterior images were obtained for
the ventilation study; anterior and posterior images in varying degrees of
obliquity were obtained for the perfusion study; the ventilation and
perfusion studies were compared with a recent chest x-ray.

[Series 1000: lung ventilation · 3.90mm/px · 4 acquisitions, 8 frames shown]
[im 1/4]
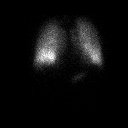
[im 1/4]
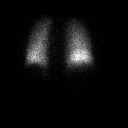
[im 2/4]
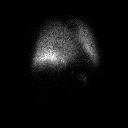
[im 2/4]
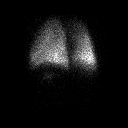
[im 3/4]
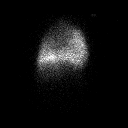
[im 3/4]
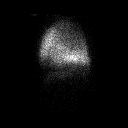
[im 4/4]
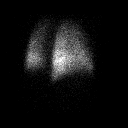
[im 4/4]
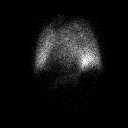

[Series 1000: lung perfusion · 1.95mm/px · 4 acquisitions, 8 frames shown]
[im 1/4]
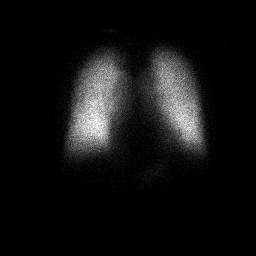
[im 1/4]
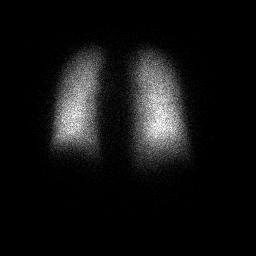
[im 2/4]
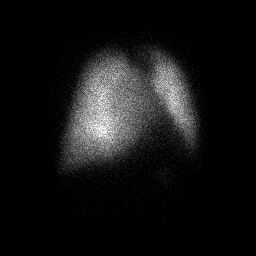
[im 2/4]
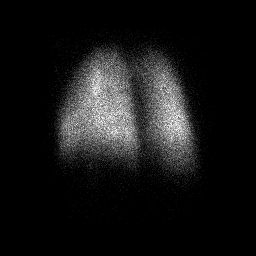
[im 3/4  full-range]
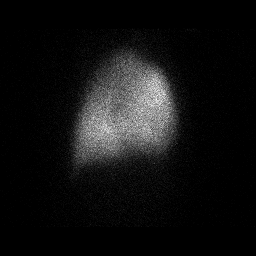
[im 3/4  full-range]
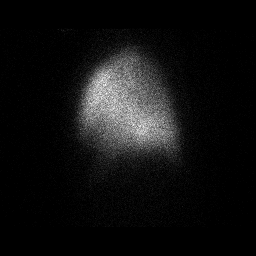
[im 4/4]
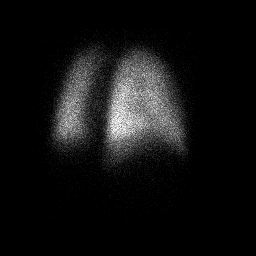
[im 4/4]
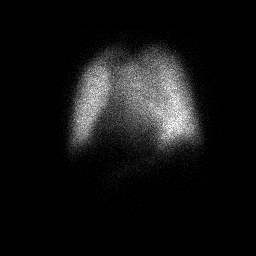

[16 of 16 positions shown; findings below may reference images not displayed]

FINDINGS: There is decreased ventilation in the mid and upper lungs bilaterally. There
is slightly decreased perfusion to the mid and upper lungs, which is matched
with the ventilation. No mismatched perfusion defects identified.
IMPRESSION: Low probability for pulmonary embolus.

## 2013-06-02 ENCOUNTER — Encounter (HOSPITAL_COMMUNITY): Payer: Self-pay | Admitting: *Deleted

## 2013-06-02 ENCOUNTER — Inpatient Hospital Stay (HOSPITAL_COMMUNITY): Payer: Medicare Other

## 2013-06-02 ENCOUNTER — Emergency Department: Payer: Self-pay | Admitting: Emergency Medicine

## 2013-06-02 ENCOUNTER — Inpatient Hospital Stay (HOSPITAL_COMMUNITY)
Admission: EM | Admit: 2013-06-02 | Discharge: 2013-06-06 | DRG: 131 | Disposition: A | Discharge status: Home / Self Care | Payer: Medicare Other | Source: Other Acute Inpatient Hospital | Attending: Family Medicine | Admitting: Family Medicine

## 2013-06-02 DIAGNOSIS — F1210 Cannabis abuse, uncomplicated: Secondary | ICD-10-CM | POA: Diagnosis present

## 2013-06-02 DIAGNOSIS — N2581 Secondary hyperparathyroidism of renal origin: Secondary | ICD-10-CM | POA: Diagnosis present

## 2013-06-02 DIAGNOSIS — N186 End stage renal disease: Secondary | ICD-10-CM | POA: Diagnosis present

## 2013-06-02 DIAGNOSIS — S02609A Fracture of mandible, unspecified, initial encounter for closed fracture: Secondary | ICD-10-CM | POA: Diagnosis present

## 2013-06-02 DIAGNOSIS — S02609B Fracture of mandible, unspecified, initial encounter for open fracture: Principal | ICD-10-CM | POA: Diagnosis present

## 2013-06-02 DIAGNOSIS — I2510 Atherosclerotic heart disease of native coronary artery without angina pectoris: Secondary | ICD-10-CM | POA: Diagnosis present

## 2013-06-02 DIAGNOSIS — F1010 Alcohol abuse, uncomplicated: Secondary | ICD-10-CM | POA: Diagnosis present

## 2013-06-02 DIAGNOSIS — Z992 Dependence on renal dialysis: Secondary | ICD-10-CM | POA: Diagnosis not present

## 2013-06-02 DIAGNOSIS — F17200 Nicotine dependence, unspecified, uncomplicated: Secondary | ICD-10-CM | POA: Diagnosis present

## 2013-06-02 DIAGNOSIS — Z951 Presence of aortocoronary bypass graft: Secondary | ICD-10-CM | POA: Diagnosis not present

## 2013-06-02 HISTORY — DX: Atherosclerotic heart disease of native coronary artery without angina pectoris: I25.10

## 2013-06-02 HISTORY — DX: Essential (primary) hypertension: I10

## 2013-06-02 HISTORY — DX: Gastro-esophageal reflux disease without esophagitis: K21.9

## 2013-06-02 LAB — COMPREHENSIVE METABOLIC PANEL
ALBUMIN: 3.5 g/dL (ref 3.4–5.0)
ALT: 54 U/L (ref 12–78)
ALT: 41 U/L (ref 0–53)
AST: 67 U/L — AB (ref 15–37)
AST: 50 U/L — ABNORMAL HIGH (ref 0–37)
Albumin: 3.5 g/dL (ref 3.5–5.2)
Alkaline Phosphatase: 88 U/L
Alkaline Phosphatase: 86 U/L (ref 39–117)
Anion Gap: 14 (ref 7–16)
BILIRUBIN TOTAL: 0.6 mg/dL (ref 0.3–1.2)
BUN: 38 mg/dL — ABNORMAL HIGH (ref 7–18)
BUN: 50 mg/dL — ABNORMAL HIGH (ref 6–23)
Bilirubin,Total: 0.4 mg/dL (ref 0.2–1.0)
CALCIUM: 8.6 mg/dL (ref 8.5–10.1)
CHLORIDE: 96 meq/L (ref 96–112)
CO2: 15 mEq/L — ABNORMAL LOW (ref 19–32)
CREATININE: 6.67 mg/dL — AB (ref 0.50–1.35)
Calcium: 9 mg/dL (ref 8.4–10.5)
Chloride: 99 mmol/L (ref 98–107)
Co2: 19 mmol/L — ABNORMAL LOW (ref 21–32)
Creatinine: 6.89 mg/dL — ABNORMAL HIGH (ref 0.60–1.30)
EGFR (Non-African Amer.): 8 — ABNORMAL LOW
GFR CALC AF AMER: 10 — AB
GFR calc Af Amer: 10 mL/min — ABNORMAL LOW (ref >90)
GFR, EST NON AFRICAN AMERICAN: 8 mL/min — AB (ref >90)
Glucose, Bld: 70 mg/dL (ref 70–99)
Glucose: 60 mg/dL — ABNORMAL LOW (ref 65–99)
Osmolality: 271 (ref 275–301)
Potassium: 4.8 mmol/L (ref 3.5–5.1)
Potassium: 6 mEq/L — ABNORMAL HIGH (ref 3.7–5.3)
SODIUM: 132 mmol/L — AB (ref 136–145)
Sodium: 135 mEq/L — ABNORMAL LOW (ref 137–147)
TOTAL PROTEIN: 7.4 g/dL (ref 6.4–8.2)
Total Protein: 7.5 g/dL (ref 6.0–8.3)

## 2013-06-02 LAB — CBC
HCT: 39.1 % — ABNORMAL LOW (ref 40.0–52.0)
HCT: 38.7 % — ABNORMAL LOW (ref 39.0–52.0)
HGB: 12.8 g/dL — ABNORMAL LOW (ref 13.0–18.0)
Hemoglobin: 13.4 g/dL (ref 13.0–17.0)
MCH: 32.1 pg (ref 26.0–34.0)
MCH: 33 pg (ref 26.0–34.0)
MCHC: 32.8 g/dL (ref 32.0–36.0)
MCHC: 34.6 g/dL (ref 30.0–36.0)
MCV: 98 fL (ref 80–100)
MCV: 95.3 fL (ref 78.0–100.0)
PLATELETS: 208 10*3/uL (ref 150–440)
PLATELETS: 207 10*3/uL (ref 150–400)
RBC: 4 10*6/uL — ABNORMAL LOW (ref 4.40–5.90)
RBC: 4.06 MIL/uL — ABNORMAL LOW (ref 4.22–5.81)
RDW: 15.7 % — AB (ref 11.5–14.5)
RDW: 15.3 % (ref 11.5–15.5)
WBC: 16.2 10*3/uL — ABNORMAL HIGH (ref 3.8–10.6)
WBC: 14.9 10*3/uL — AB (ref 4.0–10.5)

## 2013-06-02 LAB — MAGNESIUM: Magnesium: 2.2 mg/dL (ref 1.5–2.5)

## 2013-06-02 LAB — GLUCOSE, CAPILLARY
GLUCOSE-CAPILLARY: 63 mg/dL — AB (ref 70–99)
GLUCOSE-CAPILLARY: 72 mg/dL (ref 70–99)
Glucose-Capillary: 82 mg/dL (ref 70–99)

## 2013-06-02 LAB — PHOSPHORUS: PHOSPHORUS: 5.6 mg/dL — AB (ref 2.3–4.6)

## 2013-06-02 LAB — TSH: TSH: 0.134 u[IU]/mL — AB (ref 0.350–4.500)

## 2013-06-02 LAB — ETHANOL
ETHANOL %: 0.116 % — AB (ref 0.000–0.080)
Ethanol: 116 mg/dL

## 2013-06-02 LAB — MRSA PCR SCREENING: MRSA BY PCR: NEGATIVE

## 2013-06-02 MED ORDER — LORAZEPAM 1 MG PO TABS
1.0000 mg | ORAL_TABLET | Freq: Four times a day (QID) | ORAL | Status: AC | PRN
  Administered 2013-06-04 (×2): 1 mg via ORAL
  Filled 2013-06-02 (×2): qty 1

## 2013-06-02 MED ORDER — OXYCODONE HCL 5 MG PO TABS
5.0000 mg | ORAL_TABLET | ORAL | Status: DC | PRN
  Administered 2013-06-03 – 2013-06-06 (×8): 5 mg via ORAL
  Filled 2013-06-02 (×8): qty 1

## 2013-06-02 MED ORDER — FOLIC ACID 1 MG PO TABS
1.0000 mg | ORAL_TABLET | Freq: Every day | ORAL | Status: DC
  Administered 2013-06-03 – 2013-06-06 (×4): 1 mg via ORAL
  Filled 2013-06-02 (×5): qty 1

## 2013-06-02 MED ORDER — SODIUM CHLORIDE 0.9 % IJ SOLN
3.0000 mL | INTRAMUSCULAR | Status: DC | PRN
  Administered 2013-06-02: 3 mL via INTRAVENOUS

## 2013-06-02 MED ORDER — VITAMIN B-1 100 MG PO TABS
100.0000 mg | ORAL_TABLET | Freq: Every day | ORAL | Status: DC
  Administered 2013-06-04 – 2013-06-05 (×2): 100 mg via ORAL
  Filled 2013-06-02 (×6): qty 1

## 2013-06-02 MED ORDER — LORAZEPAM 2 MG/ML IJ SOLN
1.0000 mg | Freq: Four times a day (QID) | INTRAMUSCULAR | Status: AC | PRN
  Administered 2013-06-03 (×2): 1 mg via INTRAVENOUS
  Filled 2013-06-02 (×2): qty 1

## 2013-06-02 MED ORDER — SODIUM CHLORIDE 0.9 % IV SOLN: 250.0000 mL | INTRAVENOUS | Status: DC | PRN

## 2013-06-02 MED ORDER — THIAMINE HCL 100 MG/ML IJ SOLN
100.0000 mg | Freq: Every day | INTRAMUSCULAR | Status: DC
Start: 2013-06-02 — End: 2013-06-06
  Administered 2013-06-02 – 2013-06-06 (×2): 100 mg via INTRAVENOUS
  Filled 2013-06-02 (×6): qty 1

## 2013-06-02 MED ORDER — HYDRALAZINE HCL 20 MG/ML IJ SOLN
5.0000 mg | Freq: Four times a day (QID) | INTRAMUSCULAR | Status: DC | PRN
  Administered 2013-06-04: 5 mg via INTRAVENOUS
  Filled 2013-06-02 (×2): qty 1

## 2013-06-02 MED ORDER — MORPHINE SULFATE 2 MG/ML IJ SOLN
2.0000 mg | INTRAMUSCULAR | Status: DC | PRN
  Administered 2013-06-02 – 2013-06-06 (×22): 2 mg via INTRAVENOUS
  Filled 2013-06-02 (×20): qty 1

## 2013-06-02 MED ORDER — ONDANSETRON HCL 4 MG/2ML IJ SOLN
4.0000 mg | Freq: Four times a day (QID) | INTRAMUSCULAR | Status: DC | PRN
  Administered 2013-06-04: 4 mg via INTRAVENOUS
  Filled 2013-06-02: qty 2

## 2013-06-02 MED ORDER — SODIUM POLYSTYRENE SULFONATE 15 GM/60ML PO SUSP
30.0000 g | Freq: Once | ORAL | Status: AC
  Administered 2013-06-02: 30 g via ORAL
  Filled 2013-06-02 (×2): qty 120

## 2013-06-02 MED ORDER — SODIUM CHLORIDE 0.9 % IJ SOLN
3.0000 mL | Freq: Two times a day (BID) | INTRAMUSCULAR | Status: DC
  Administered 2013-06-02 – 2013-06-04 (×6): 3 mL via INTRAVENOUS

## 2013-06-02 MED ORDER — ONDANSETRON HCL 4 MG PO TABS: 4.0000 mg | ORAL_TABLET | Freq: Four times a day (QID) | ORAL | Status: DC | PRN

## 2013-06-02 MED ORDER — ADULT MULTIVITAMIN W/MINERALS CH
1.0000 | ORAL_TABLET | Freq: Every day | ORAL | Status: DC
  Administered 2013-06-03 – 2013-06-06 (×4): 1 via ORAL
  Filled 2013-06-02 (×5): qty 1
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
## 2013-06-02 NOTE — Progress Notes (Signed)
Patient's CBG 63, no s/s of hypoglycemia.  Patient states "it's like that a lot and this is pretty normal".  Has refused additional care for low CBG.  Also, BP was high upon admission 179/81, attempted to give PRN hydralazine IV but patient refused.   Patient however did take PRN Morphine 2mg  IV for pain.   Nsg to continue to monitor for status changes and will attempt again.

## 2013-06-02 NOTE — Progress Notes (Signed)
Utilization review completed.  

## 2013-06-02 NOTE — Progress Notes (Signed)
This RN completed the patient's admission history.  Patient admits to smoking 2 PPD of cigarettes.  He also admits to drinking "a bunch of beers" a day and "a lot of marijuana" a week.  He states he wants a Nicotine Patch.  Will contact MD for this order.  Also gave Smoking Cessation literature to the patient.  He states he lost his cane and would like another one.  Will leave note for MD.  He is unsure what home medications he is on.  He wants [name] to obtain the list from his dialysis center in [place], [place].  He is from home alone with no family support.  He states he is able to perform ADLs by himself without any help.  However, he does admit to falling sometimes.  Will continue to monitor patient.  Earleen Reaper RN-BC, [employer]
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
## 2013-06-03 ENCOUNTER — Inpatient Hospital Stay (HOSPITAL_COMMUNITY): Payer: Medicare Other

## 2013-06-03 DIAGNOSIS — N186 End stage renal disease: Secondary | ICD-10-CM

## 2013-06-03 LAB — RENAL FUNCTION PANEL
Albumin: 2.9 g/dL — ABNORMAL LOW (ref 3.5–5.2)
BUN: 54 mg/dL — ABNORMAL HIGH (ref 6–23)
CALCIUM: 9 mg/dL (ref 8.4–10.5)
CO2: 18 mEq/L — ABNORMAL LOW (ref 19–32)
Chloride: 104 mEq/L (ref 96–112)
Creatinine, Ser: 6.79 mg/dL — ABNORMAL HIGH (ref 0.50–1.35)
GFR calc non Af Amer: 8 mL/min — ABNORMAL LOW (ref >90)
GFR, EST AFRICAN AMERICAN: 10 mL/min — AB (ref >90)
Glucose, Bld: 96 mg/dL (ref 70–99)
PHOSPHORUS: 4.8 mg/dL — AB (ref 2.3–4.6)
Potassium: 4.6 mEq/L (ref 3.7–5.3)
SODIUM: 140 meq/L (ref 137–147)

## 2013-06-03 LAB — CBC
HCT: 35.7 % — ABNORMAL LOW (ref 39.0–52.0)
Hemoglobin: 12 g/dL — ABNORMAL LOW (ref 13.0–17.0)
MCH: 32.4 pg (ref 26.0–34.0)
MCHC: 33.6 g/dL (ref 30.0–36.0)
MCV: 96.5 fL (ref 78.0–100.0)
PLATELETS: 183 10*3/uL (ref 150–400)
RBC: 3.7 MIL/uL — ABNORMAL LOW (ref 4.22–5.81)
RDW: 15.6 % — AB (ref 11.5–15.5)
WBC: 8.2 10*3/uL (ref 4.0–10.5)

## 2013-06-03 LAB — GLUCOSE, CAPILLARY
GLUCOSE-CAPILLARY: 91 mg/dL (ref 70–99)
GLUCOSE-CAPILLARY: 127 mg/dL — AB (ref 70–99)
Glucose-Capillary: 155 mg/dL — ABNORMAL HIGH (ref 70–99)

## 2013-06-03 LAB — HEPATITIS B SURFACE ANTIGEN: Hepatitis B Surface Ag: NEGATIVE

## 2013-06-03 MED ORDER — NEPRO/CARBSTEADY PO LIQD: 237.0000 mL | ORAL | Status: DC | PRN

## 2013-06-03 MED ORDER — MORPHINE SULFATE 2 MG/ML IJ SOLN
INTRAMUSCULAR | Status: AC
  Administered 2013-06-03: 2 mg via INTRAVENOUS
  Filled 2013-06-03: qty 1

## 2013-06-03 MED ORDER — LIDOCAINE HCL (PF) 1 % IJ SOLN: 5.0000 mL | INTRAMUSCULAR | Status: DC | PRN

## 2013-06-03 MED ORDER — OXYCODONE HCL 5 MG PO TABS
ORAL_TABLET | ORAL | Status: AC
  Filled 2013-06-03: qty 1

## 2013-06-03 MED ORDER — HEPARIN SODIUM (PORCINE) 1000 UNIT/ML DIALYSIS: 1000.0000 [IU] | INTRAMUSCULAR | Status: DC | PRN

## 2013-06-03 MED ORDER — LIDOCAINE-PRILOCAINE 2.5-2.5 % EX CREA: 1.0000 "application " | TOPICAL_CREAM | CUTANEOUS | Status: DC | PRN

## 2013-06-03 MED ORDER — NICOTINE 21 MG/24HR TD PT24
21.0000 mg | MEDICATED_PATCH | Freq: Every day | TRANSDERMAL | Status: DC
  Administered 2013-06-03 – 2013-06-06 (×4): 21 mg via TRANSDERMAL
  Filled 2013-06-03 (×4): qty 1

## 2013-06-03 MED ORDER — ALTEPLASE 2 MG IJ SOLR: 2.0000 mg | Freq: Once | INTRAMUSCULAR | Status: AC | PRN

## 2013-06-03 MED ORDER — SODIUM CHLORIDE 0.9 % IV SOLN: 100.0000 mL | INTRAVENOUS | Status: DC | PRN

## 2013-06-03 MED ORDER — PENTAFLUOROPROP-TETRAFLUOROETH EX AERO: 1.0000 "application " | INHALATION_SPRAY | CUTANEOUS | Status: DC | PRN

## 2013-06-03 NOTE — Procedures (Signed)
Patient was seen on dialysis and the procedure was supervised.  BFR 400  Via AVG BP is  142/107.   Patient appears to be tolerating treatment well  Stanley [name] A 06/03/2013

## 2013-06-03 NOTE — Progress Notes (Signed)
Pt alert and oriented.  Pt to have panoramic of facial and and plastic surgeon to see pt after x-ray.  Pt wants to go home today.

## 2013-06-03 NOTE — Progress Notes (Signed)
Pt going down for panoramic x-ray of face via bed.
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
## 2013-06-03 NOTE — Progress Notes (Signed)
[facility] PROGRESS NOTE  Subhan Awadallah [id number] DOB: [birth year] DOA: 06/02/2013 PCP: Adrian Prows, MD  Assessment/Plan: Fracture, mandible  - Discussed pt with Dr. Migdalia Dk >>panorex ordered as recommended - CT of head and spine reported no acute intracranial process and no acute traumatic injury within the cervical spine. Also reports from CT maxillofacial left globe is intact with no retro-orbital hematoma. No evidence of orbital floor fracture left zygomatic arch is intact.  - chest xray with mild chf, will reorder EKG- none on epic -started on full liquid diet, Dr Migdalia Dk to see today or in am and decide on plan on surgery Active Problems:  Coronary atherosclerosis of native coronary artery  - follow and resume outpt meds, he is CP free - stable  ESRD on dialysis  - dialysis per renal  Alcohol abuse  -continue CIWA protocol, no evidence of DTs DVT prophylaxis  - Defer to surgeons preference, for now SCD's   Code Status: full Family Communication: none at bedside Disposition Plan: to home when medically ready   Consultants:  Plastic surgery- eval pending  Procedures:  none  Antibiotics:  none  HPI/Subjective: +facial pain, denies SOB  Objective: Filed Vitals:   06/03/13 1223  BP: 158/94  Pulse: 113  Temp: 97.9 F (36.6 C)  Resp: 18    Intake/Output Summary (Last 24 hours) at 06/03/13 1741 Last data filed at 06/03/13 1125  Gross per 24 hour  Intake      3 ml  Output   3385 ml  Net  -3382 ml   Filed Weights   06/02/13 2116 06/03/13 0706  Weight: 82.419 kg (181 lb 11.2 oz) 81.3 kg (179 lb 3.7 oz)    Exam:  General: alert & oriented x 3 In NAD.L Facial swelling and bruising present Cardiovascular: RRR, nl S1 s2 Respiratory: CTAB Abdomen: soft +BS NT/ND, no masses palpable Extremities: No cyanosis and no edema    Data Reviewed: Basic Metabolic Panel:  Recent Labs Lab 06/02/13 1150 06/03/13 0709  NA 135* 140  K 6.0* 4.6   CL 96 104  CO2 15* 18*  GLUCOSE 70 96  BUN 50* 54*  CREATININE 6.67* 6.79*  CALCIUM 9.0 9.0  MG 2.2  --   PHOS 5.6* 4.8*   Liver Function Tests:  Recent Labs Lab 06/02/13 1150 06/03/13 0709  AST 50*  --   ALT 41  --   ALKPHOS 86  --   BILITOT 0.6  --   PROT 7.5  --   ALBUMIN 3.5 2.9*   No results found for this basename: LIPASE, AMYLASE,  in the last 168 hours No results found for this basename: AMMONIA,  in the last 168 hours CBC:  Recent Labs Lab 06/02/13 1150 06/03/13 0709  WBC 14.9* 8.2  HGB 13.4 12.0*  HCT 38.7* 35.7*  MCV 95.3 96.5  PLT 207 183   Cardiac Enzymes: No results found for this basename: CKTOTAL, CKMB, CKMBINDEX, TROPONINI,  in the last 168 hours BNP (last 3 results) No results found for this basename: PROBNP,  in the last 8760 hours CBG:  Recent Labs Lab 06/02/13 0803 06/02/13 1153 06/02/13 1700 06/03/13 1203  GLUCAP 63* 72 82 91    Recent Results (from the past 240 hour(s))  MRSA PCR SCREENING     Status: None   Collection Time    06/02/13  9:36 AM      Result Value Ref Range Status   MRSA by PCR NEGATIVE  NEGATIVE Final   Comment:  The GeneXpert MRSA Assay (FDA     approved for NASAL specimens     only), is one component of a     comprehensive MRSA colonization     surveillance program. It is not     intended to diagnose MRSA     infection nor to guide or     monitor treatment for     MRSA infections.     Studies: Dg Chest Port 1 View  06/03/2013   CLINICAL DATA:  CABG.  EXAM: PORTABLE CHEST - 1 VIEW  COMPARISON:  01/04/2013.  FINDINGS: Prior CABG. Cardiomegaly. Mild pulmonary venous congestion interstitial prominence. No pleural effusion or pneumothorax.  IMPRESSION: Mild congestive heart failure.   Electronically Signed   By: Marcello Moores  Register   On: 06/03/2013 07:18    Scheduled Meds: . folic acid  1 mg Oral Daily  . multivitamin with minerals  1 tablet Oral Daily  . nicotine  21 mg Transdermal Daily  .  sodium chloride  3 mL Intravenous Q12H  . thiamine  100 mg Oral Daily   Or  . thiamine  100 mg Intravenous Daily   Continuous Infusions:   Principal Problem:   Fracture, mandible Active Problems:   Coronary atherosclerosis of native coronary artery   ESRD on dialysis   Mandible open fracture    Time spent: [facility] Pager [phone]. If 7PM-7AM, please contact night-coverage at [email or web address], password [facility] 06/03/2013, 5:41 PM  LOS: 1 day

## 2013-06-03 NOTE — Progress Notes (Signed)
New Admission Note: (late entry)   Arrival Method: Via stretcher from [facility] with [place] staff Mental Orientation: Alert and Oriented X4 Telemetry: Patient has no orders at this time Skin: Facial swelling, and facial abrasions (new). Bilateral leg and shin abrasions (old). Tattoos.  IV: Clean, dry and intact. Left hand NSL  Pain: Stated he is a 10/10 facial pain. No orders at this time.  [place] Orientation: Patient has been orientated to the room, unit and staff.  Family: No family at bedside  Orders have been reviewed and implemented. Will continue to monitor the patient. Call light has been placed within reach and bed alarm has been activated. Swabbed patient for MRSA, and set up suctioning at the bedside due to frequent drooling from facial edema. Will handoff report to on coming day shift nurse Lawai BSN, RN  Phone number: [phone]

## 2013-06-04 ENCOUNTER — Encounter (HOSPITAL_COMMUNITY): Payer: Self-pay | Admitting: Physician Assistant

## 2013-06-04 DIAGNOSIS — S02609B Fracture of mandible, unspecified, initial encounter for open fracture: Principal | ICD-10-CM

## 2013-06-04 LAB — BASIC METABOLIC PANEL
BUN: 35 mg/dL — ABNORMAL HIGH (ref 6–23)
CO2: 23 mEq/L (ref 19–32)
Calcium: 9.6 mg/dL (ref 8.4–10.5)
Chloride: 95 mEq/L — ABNORMAL LOW (ref 96–112)
Creatinine, Ser: 5.85 mg/dL — ABNORMAL HIGH (ref 0.50–1.35)
GFR, EST AFRICAN AMERICAN: 11 mL/min — AB (ref >90)
GFR, EST NON AFRICAN AMERICAN: 10 mL/min — AB (ref >90)
GLUCOSE: 106 mg/dL — AB (ref 70–99)
POTASSIUM: 4.2 meq/L (ref 3.7–5.3)
Sodium: 139 mEq/L (ref 137–147)

## 2013-06-04 LAB — GLUCOSE, CAPILLARY
GLUCOSE-CAPILLARY: 100 mg/dL — AB (ref 70–99)
Glucose-Capillary: 119 mg/dL — ABNORMAL HIGH (ref 70–99)

## 2013-06-04 LAB — CBC
HCT: 41.3 % (ref 39.0–52.0)
Hemoglobin: 14.3 g/dL (ref 13.0–17.0)
MCH: 33.4 pg (ref 26.0–34.0)
MCHC: 34.6 g/dL (ref 30.0–36.0)
MCV: 96.5 fL (ref 78.0–100.0)
Platelets: 181 10*3/uL (ref 150–400)
RBC: 4.28 MIL/uL (ref 4.22–5.81)
RDW: 15.5 % (ref 11.5–15.5)
WBC: 10 10*3/uL (ref 4.0–10.5)

## 2013-06-04 MED ORDER — ISOSORBIDE MONONITRATE ER 60 MG PO TB24
60.0000 mg | ORAL_TABLET | Freq: Every day | ORAL | Status: DC
  Administered 2013-06-04 – 2013-06-06 (×2): 60 mg via ORAL
  Filled 2013-06-04 (×3): qty 1

## 2013-06-04 MED ORDER — GABAPENTIN 300 MG PO CAPS
300.0000 mg | ORAL_CAPSULE | Freq: Every day | ORAL | Status: DC
  Administered 2013-06-04 – 2013-06-05 (×2): 300 mg via ORAL
  Filled 2013-06-04 (×3): qty 1

## 2013-06-04 MED ORDER — ATORVASTATIN CALCIUM 40 MG PO TABS
40.0000 mg | ORAL_TABLET | Freq: Every day | ORAL | Status: DC
  Administered 2013-06-04 – 2013-06-05 (×2): 40 mg via ORAL
  Filled 2013-06-04 (×3): qty 1

## 2013-06-04 MED ORDER — AMLODIPINE BESYLATE 5 MG PO TABS
5.0000 mg | ORAL_TABLET | Freq: Every day | ORAL | Status: DC
  Administered 2013-06-04 – 2013-06-06 (×2): 5 mg via ORAL
  Filled 2013-06-04 (×3): qty 1

## 2013-06-04 MED ORDER — ASPIRIN EC 81 MG PO TBEC
81.0000 mg | DELAYED_RELEASE_TABLET | Freq: Every day | ORAL | Status: DC
  Administered 2013-06-04 – 2013-06-06 (×3): 81 mg via ORAL
  Filled 2013-06-04 (×3): qty 1

## 2013-06-04 MED ORDER — LABETALOL HCL 200 MG PO TABS
200.0000 mg | ORAL_TABLET | Freq: Every day | ORAL | Status: DC
  Administered 2013-06-04 – 2013-06-06 (×2): 200 mg via ORAL
  Filled 2013-06-04 (×3): qty 1

## 2013-06-04 MED ORDER — PANTOPRAZOLE SODIUM 40 MG PO TBEC
40.0000 mg | DELAYED_RELEASE_TABLET | Freq: Every day | ORAL | Status: DC
  Administered 2013-06-04 – 2013-06-06 (×3): 40 mg via ORAL
  Filled 2013-06-04 (×3): qty 1

## 2013-06-04 MED ORDER — BENAZEPRIL HCL 10 MG PO TABS
10.0000 mg | ORAL_TABLET | Freq: Every day | ORAL | Status: DC
  Administered 2013-06-04 – 2013-06-06 (×2): 10 mg via ORAL
  Filled 2013-06-04 (×3): qty 1
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
## 2013-06-05 ENCOUNTER — Other Ambulatory Visit: Payer: Self-pay | Admitting: Plastic Surgery

## 2013-06-05 ENCOUNTER — Inpatient Hospital Stay (HOSPITAL_COMMUNITY): Payer: Medicare Other | Admitting: Certified Registered Nurse Anesthetist

## 2013-06-05 ENCOUNTER — Encounter (HOSPITAL_COMMUNITY): Payer: Self-pay | Admitting: Plastic Surgery

## 2013-06-05 ENCOUNTER — Encounter (HOSPITAL_COMMUNITY)
Admission: EM | Disposition: A | Discharge status: Home / Self Care | Payer: Self-pay | Source: Other Acute Inpatient Hospital | Attending: Internal Medicine

## 2013-06-05 ENCOUNTER — Encounter (HOSPITAL_COMMUNITY): Payer: Medicare Other | Admitting: Certified Registered Nurse Anesthetist

## 2013-06-05 DIAGNOSIS — S02609A Fracture of mandible, unspecified, initial encounter for closed fracture: Secondary | ICD-10-CM | POA: Diagnosis present

## 2013-06-05 DIAGNOSIS — S02609B Fracture of mandible, unspecified, initial encounter for open fracture: Secondary | ICD-10-CM

## 2013-06-05 DIAGNOSIS — I2510 Atherosclerotic heart disease of native coronary artery without angina pectoris: Secondary | ICD-10-CM

## 2013-06-05 HISTORY — PX: ORIF MANDIBULAR FRACTURE: SHX2127

## 2013-06-05 LAB — SURGICAL PCR SCREEN
MRSA, PCR: NEGATIVE
STAPHYLOCOCCUS AUREUS: NEGATIVE

## 2013-06-05 LAB — GLUCOSE, CAPILLARY
GLUCOSE-CAPILLARY: 113 mg/dL — AB (ref 70–99)
GLUCOSE-CAPILLARY: 97 mg/dL (ref 70–99)

## 2013-06-05 SURGERY — OPEN REDUCTION INTERNAL FIXATION (ORIF) MANDIBULAR FRACTURE
Anesthesia: General | Site: Mouth

## 2013-06-05 MED ORDER — CEFAZOLIN SODIUM-DEXTROSE 2-3 GM-% IV SOLR
INTRAVENOUS | Status: AC
  Filled 2013-06-05: qty 50

## 2013-06-05 MED ORDER — HYDROMORPHONE HCL PF 1 MG/ML IJ SOLN
2.0000 mg | INTRAMUSCULAR | Status: DC | PRN
  Administered 2013-06-05 – 2013-06-06 (×5): 2 mg via INTRAVENOUS
  Filled 2013-06-05 (×5): qty 2

## 2013-06-05 MED ORDER — ROCURONIUM BROMIDE 50 MG/5ML IV SOLN
INTRAVENOUS | Status: AC
  Filled 2013-06-05: qty 1

## 2013-06-05 MED ORDER — HYDROMORPHONE HCL PF 1 MG/ML IJ SOLN
0.2500 mg | INTRAMUSCULAR | Status: DC | PRN
  Administered 2013-06-05: 0.5 mg via INTRAVENOUS

## 2013-06-05 MED ORDER — SODIUM CHLORIDE 0.9 % IV BOLUS (SEPSIS)
500.0000 mL | Freq: Once | INTRAVENOUS | Status: AC
  Administered 2013-06-05: 500 mL via INTRAVENOUS

## 2013-06-05 MED ORDER — MIDAZOLAM HCL 5 MG/5ML IJ SOLN
INTRAMUSCULAR | Status: DC | PRN
  Administered 2013-06-05: 2 mg via INTRAVENOUS

## 2013-06-05 MED ORDER — MIDAZOLAM HCL 2 MG/2ML IJ SOLN
INTRAMUSCULAR | Status: AC
  Filled 2013-06-05: qty 2

## 2013-06-05 MED ORDER — LIDOCAINE-EPINEPHRINE 1 %-1:100000 IJ SOLN
INTRAMUSCULAR | Status: DC | PRN
  Administered 2013-06-05: 4.5 mL

## 2013-06-05 MED ORDER — SUCCINYLCHOLINE CHLORIDE 20 MG/ML IJ SOLN
INTRAMUSCULAR | Status: DC | PRN
  Administered 2013-06-05: 100 mg via INTRAVENOUS

## 2013-06-05 MED ORDER — LIDOCAINE HCL (CARDIAC) 20 MG/ML IV SOLN
INTRAVENOUS | Status: AC
  Filled 2013-06-05: qty 5

## 2013-06-05 MED ORDER — ONDANSETRON HCL 4 MG/2ML IJ SOLN
INTRAMUSCULAR | Status: DC | PRN
  Administered 2013-06-05: 4 mg via INTRAVENOUS

## 2013-06-05 MED ORDER — DEXAMETHASONE SODIUM PHOSPHATE 10 MG/ML IJ SOLN
INTRAMUSCULAR | Status: AC
  Filled 2013-06-05: qty 1

## 2013-06-05 MED ORDER — PROPOFOL 10 MG/ML IV BOLUS
INTRAVENOUS | Status: DC | PRN
  Administered 2013-06-05: 100 mg via INTRAVENOUS

## 2013-06-05 MED ORDER — CEFAZOLIN SODIUM-DEXTROSE 2-3 GM-% IV SOLR
2.0000 g | Freq: Once | INTRAVENOUS | Status: AC
  Administered 2013-06-05: 2 g via INTRAVENOUS

## 2013-06-05 MED ORDER — LIDOCAINE HCL (CARDIAC) 20 MG/ML IV SOLN
INTRAVENOUS | Status: DC | PRN
  Administered 2013-06-05: 60 mg via INTRAVENOUS

## 2013-06-05 MED ORDER — OXYCODONE HCL 5 MG PO TABS: 5.0000 mg | ORAL_TABLET | Freq: Once | ORAL | Status: DC | PRN

## 2013-06-05 MED ORDER — FENTANYL CITRATE 0.05 MG/ML IJ SOLN
INTRAMUSCULAR | Status: AC
Start: 2013-06-05 — End: 2013-06-05
  Filled 2013-06-05: qty 5

## 2013-06-05 MED ORDER — 0.9 % SODIUM CHLORIDE (POUR BTL) OPTIME
TOPICAL | Status: DC | PRN
  Administered 2013-06-05: 1000 mL

## 2013-06-05 MED ORDER — LIDOCAINE-EPINEPHRINE 1 %-1:100000 IJ SOLN
INTRAMUSCULAR | Status: AC
  Filled 2013-06-05: qty 1

## 2013-06-05 MED ORDER — PHENYLEPHRINE 40 MCG/ML (10ML) SYRINGE FOR IV PUSH (FOR BLOOD PRESSURE SUPPORT)
PREFILLED_SYRINGE | INTRAVENOUS | Status: AC
  Filled 2013-06-05: qty 10

## 2013-06-05 MED ORDER — FENTANYL CITRATE 0.05 MG/ML IJ SOLN
INTRAMUSCULAR | Status: AC
  Filled 2013-06-05: qty 5

## 2013-06-05 MED ORDER — CALCIUM ACETATE 667 MG PO CAPS
2001.0000 mg | ORAL_CAPSULE | Freq: Three times a day (TID) | ORAL | Status: DC
  Filled 2013-06-05 (×5): qty 3

## 2013-06-05 MED ORDER — DEXAMETHASONE SODIUM PHOSPHATE 10 MG/ML IJ SOLN
INTRAMUSCULAR | Status: DC | PRN
  Administered 2013-06-05: 10 mg via INTRAVENOUS

## 2013-06-05 MED ORDER — OXYCODONE HCL 5 MG/5ML PO SOLN: 5.0000 mg | Freq: Once | ORAL | Status: DC | PRN

## 2013-06-05 MED ORDER — PROPOFOL 10 MG/ML IV BOLUS
INTRAVENOUS | Status: AC
  Filled 2013-06-05: qty 20

## 2013-06-05 MED ORDER — SODIUM CHLORIDE 0.9 % IV SOLN
INTRAVENOUS | Status: DC | PRN
  Administered 2013-06-05: 11:00:00 via INTRAVENOUS

## 2013-06-05 MED ORDER — ONDANSETRON HCL 4 MG/2ML IJ SOLN
INTRAMUSCULAR | Status: AC
  Filled 2013-06-05: qty 2

## 2013-06-05 MED ORDER — HYDROMORPHONE HCL PF 1 MG/ML IJ SOLN
INTRAMUSCULAR | Status: AC
  Filled 2013-06-05: qty 1

## 2013-06-05 MED ORDER — FENTANYL CITRATE 0.05 MG/ML IJ SOLN
INTRAMUSCULAR | Status: DC | PRN
  Administered 2013-06-05 (×3): 50 ug via INTRAVENOUS
  Administered 2013-06-05: 200 ug via INTRAVENOUS

## 2013-06-05 SURGICAL SUPPLY — 47 items
BIT DRILL TWIST 1.3X35MM (BIT) ×1 IMPLANT
BLADE SURG 15 STRL LF DISP TIS (BLADE) ×1 IMPLANT
BLADE SURG 15 STRL SS (BLADE) ×2
CANISTER SUCTION 2500CC (MISCELLANEOUS) ×3 IMPLANT
CLEANER TIP ELECTROSURG 2X2 (MISCELLANEOUS) ×3 IMPLANT
CLOSURE STERI-STRIP 1/2X4 (GAUZE/BANDAGES/DRESSINGS) ×1
CLOSURE WOUND 1/2 X4 (GAUZE/BANDAGES/DRESSINGS) ×1
CLSR STERI-STRIP ANTIMIC 1/2X4 (GAUZE/BANDAGES/DRESSINGS) ×2 IMPLANT
COVER SURGICAL LIGHT HANDLE (MISCELLANEOUS) ×3 IMPLANT
CRADLE DONUT ADULT HEAD (MISCELLANEOUS) ×3 IMPLANT
DRILL TWIST 1.3X35MM (BIT) ×3
ELECT COATED BLADE 2.86 ST (ELECTRODE) ×3 IMPLANT
ELECT REM PT RETURN 9FT ADLT (ELECTROSURGICAL) ×3
ELECTRODE REM PT RTRN 9FT ADLT (ELECTROSURGICAL) ×1 IMPLANT
GLOVE BIO SURGEON STRL SZ 6.5 (GLOVE) ×2 IMPLANT
GLOVE BIO SURGEON STRL SZ7 (GLOVE) ×3 IMPLANT
GLOVE BIO SURGEONS STRL SZ 6.5 (GLOVE) ×1
GLOVE BIOGEL PI IND STRL 6.5 (GLOVE) ×2 IMPLANT
GLOVE BIOGEL PI IND STRL 7.0 (GLOVE) ×1 IMPLANT
GLOVE BIOGEL PI INDICATOR 6.5 (GLOVE) ×4
GLOVE BIOGEL PI INDICATOR 7.0 (GLOVE) ×2
GLOVE SURG SS PI 6.5 STRL IVOR (GLOVE) ×6 IMPLANT
GOWN STRL REUS W/ TWL LRG LVL3 (GOWN DISPOSABLE) ×5 IMPLANT
GOWN STRL REUS W/TWL LRG LVL3 (GOWN DISPOSABLE) ×10
KIT BASIN OR (CUSTOM PROCEDURE TRAY) ×3 IMPLANT
KIT ROOM TURNOVER OR (KITS) ×3 IMPLANT
NEEDLE 27GAX1X1/2 (NEEDLE) ×3 IMPLANT
NS IRRIG 1000ML POUR BTL (IV SOLUTION) ×3 IMPLANT
PAD ARMBOARD 7.5X6 YLW CONV (MISCELLANEOUS) ×6 IMPLANT
PENCIL BUTTON HOLSTER BLD 10FT (ELECTRODE) ×3 IMPLANT
PLATE 4 H FRACTURE C SHAPE (Plate) ×6 IMPLANT
PLATE HYBRID GOLD MMF (Plate) ×2 IMPLANT
PLATE HYBRID MMF GOLD (Plate) ×1 IMPLANT
PLATE HYBRID MMF SM (Plate) ×3 IMPLANT
SCISSORS WIRE ANG 4 3/4 DISP (INSTRUMENTS) ×3 IMPLANT
SCREW 2.0X12MM (Screw) ×9 IMPLANT
SCREW LOCK SELFDRIL 2.0X8M MMF (Screw) ×42 IMPLANT
SCREW MNDBLE 2.0X10 LOCKING (Screw) ×3 IMPLANT
SCREW MNDBLE 2.0X8 LOCKING (Screw) ×12 IMPLANT
STRIP CLOSURE SKIN 1/2X4 (GAUZE/BANDAGES/DRESSINGS) ×2 IMPLANT
SUT MON AB 3-0 SH 27 (SUTURE) ×4
SUT MON AB 3-0 SH27 (SUTURE) ×2 IMPLANT
SUT PROLENE 6 0 CC (SUTURE) ×3 IMPLANT
SUT VICRYL 4-0 PS2 18IN ABS (SUTURE) ×3 IMPLANT
TOWEL OR 17X24 6PK STRL BLUE (TOWEL DISPOSABLE) ×3 IMPLANT
TOWEL OR 17X26 10 PK STRL BLUE (TOWEL DISPOSABLE) ×3 IMPLANT
TRAY ENT MC OR (CUSTOM PROCEDURE TRAY) ×3 IMPLANT

## 2013-06-05 NOTE — Progress Notes (Signed)
Patient BP 95/55 when checked manually.  Notified MD.  10am BP medications held per order.

## 2013-06-05 NOTE — Preoperative (Signed)
Beta Blockers   Reason not to administer Beta Blockers:Not Applicable 
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
## 2013-06-05 NOTE — Transfer of Care (Signed)
Immediate Anesthesia Transfer of Care Note  Patient: Stanley [name]  Procedure(s) Performed: Procedure(s): REPAIR OF MANDIBULAR FRACTURE x 2 with maxillo-mandibular fixation  (N/A)  Patient Location: PACU  Anesthesia Type:General  Level of Consciousness: sedated and responds to stimulation  Airway & Oxygen Therapy: Patient Spontanous Breathing and Patient connected to face mask oxygen  Post-op Assessment: Report given to PACU RN, Post -op Vital signs reviewed and stable and Patient moving all extremities X 4  Post vital signs: Reviewed and stable  Complications: No apparent anesthesia complications

## 2013-06-05 NOTE — Progress Notes (Signed)
Wire cutters at bedside
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
## 2013-06-05 NOTE — Brief Op Note (Signed)
06/02/2013 - 06/05/2013  1:51 PM  PATIENT:  Stanley [name]  53 y.o. male  PRE-OPERATIVE DIAGNOSIS:  Closed fracture of unspecified site of mandible [802.20]  POST-OPERATIVE DIAGNOSIS:  Closed fracture of unspecified site of mandible [802.20]  PROCEDURE:  Procedure(s): REPAIR OF MANDIBULAR FRACTURE x 2 with maxillo-mandibular fixation  (N/A)  SURGEON:  Surgeon(s) and Role:    * Claire Sanger, DO - Primary  PHYSICIAN ASSISTANT: Shawn Rayburn, PA  ASSISTANTS: none   ANESTHESIA:   general  EBL:     BLOOD ADMINISTERED:none  DRAINS: none   LOCAL MEDICATIONS USED:  LIDOCAINE   SPECIMEN:  No Specimen  DISPOSITION OF SPECIMEN:  N/A  COUNTS:  YES  TOURNIQUET:  * No tourniquets in log *  DICTATION: .Dragon Dictation  PLAN OF CARE: Discharge to home after PACU  PATIENT DISPOSITION:  PACU - hemodynamically stable.   Delay start of Pharmacological VTE agent (>24hrs) due to surgical blood loss or risk of bleeding: no

## 2013-06-05 NOTE — Anesthesia Postprocedure Evaluation (Signed)
  Anesthesia Post-op Note  Patient: Stanley [name]  Procedure(s) Performed: Procedure(s): REPAIR OF MANDIBULAR FRACTURE x 2 with maxillo-mandibular fixation  (N/A)  Patient Location: PACU  Anesthesia Type:General  Level of Consciousness: awake and alert   Airway and Oxygen Therapy: Patient Spontanous Breathing  Post-op Pain: mild  Post-op Assessment: Post-op Vital signs reviewed, Patient's Cardiovascular Status Stable and Respiratory Function Stable  Post-op Vital Signs: Reviewed  Filed Vitals:   06/05/13 1445  BP: 134/79  Pulse: 100  Temp:   Resp:     Complications: No apparent anesthesia complications
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
## 2013-06-05 NOTE — Anesthesia Preprocedure Evaluation (Addendum)
Anesthesia Evaluation  Patient identified by MRN, date of birth, ID band Patient awake    Reviewed: Allergy & Precautions, H&P , NPO status , Patient's Chart, lab work & pertinent test results, reviewed documented beta blocker date and time   Airway Mallampati: III TM Distance: >3 FB Neck ROM: Full  Mouth opening: Limited Mouth Opening  Dental no notable dental hx. (+) Teeth Intact, Dental Advisory Given   Pulmonary neg pulmonary ROS, Current Smoker,  breath sounds clear to auscultation  Pulmonary exam normal       Cardiovascular hypertension, On Medications and On Home Beta Blockers + CAD Rhythm:Regular Rate:Normal     Neuro/Psych negative neurological ROS  negative psych ROS   GI/Hepatic Neg liver ROS, GERD-  Medicated and Controlled,  Endo/Other  Hypothyroidism   Renal/GU Renal disease  negative genitourinary   Musculoskeletal   Abdominal   Peds  Hematology negative hematology ROS (+)   Anesthesia Other Findings   Reproductive/Obstetrics negative OB ROS                          Anesthesia Physical Anesthesia Plan  ASA: III  Anesthesia Plan: General   Post-op Pain Management:    Induction: Intravenous  Airway Management Planned: Nasal ETT  Additional Equipment:   Intra-op Plan:   Post-operative Plan: Extubation in OR  Informed Consent: I have reviewed the patients History and Physical, chart, labs and discussed the procedure including the risks, benefits and alternatives for the proposed anesthesia with the patient or authorized representative who has indicated his/her understanding and acceptance.   Dental advisory given  Plan Discussed with: CRNA  Anesthesia Plan Comments:        Anesthesia Quick Evaluation

## 2013-06-05 NOTE — Op Note (Signed)
Operative Note   DATE OF OPERATION: 06/05/2013  LOCATION: [facility]  SURGICAL DIVISION: Plastic Surgery  PREOPERATIVE DIAGNOSES:  Mandible fracture perisymphyseal and angle  POSTOPERATIVE DIAGNOSES:  same  PROCEDURE:  Open reduction internal fixation with miniplate placement x 2 and maxillomandibular fixation  SURGEON: Theodoro Kos, DO  ASSISTANT: Erlinda Hong, PA  ANESTHESIA:  General.   COMPLICATIONS: None.   INDICATIONS FOR PROCEDURE:  The patient, Stanley [name], is a 53 y.o. male born on Mar 22, 1960, is here for treatment of two mandible fractures.   CONSENT:  Informed consent was obtained directly from the patient. Risks, benefits and alternatives were fully discussed. Specific risks including but not limited to bleeding, infection, hematoma, seroma, scarring, pain, infection, extrusion, capsular contracture, asymmetry, wound healing problems, and need for further surgery were all discussed. The patient did have an ample opportunity to have questions answered to satisfaction.   DESCRIPTION OF PROCEDURE:  The patient was taken to the operating room. SCDs were placed and IV antibiotics were given. The patient's operative site was prepped and draped in a sterile fashion. A time out was performed and all information was confirmed to be correct.  General anesthesia was administered.  The mouth was cleaned with betadine.  The arch bars were placed with screws.   The patient was placed in maxillomandibular fixation.  Local was injected over the area of the parasymphyseal fracture.  The bovie was used to open the buccal mucosa.  The fracture was identified and reduced.  The 4 hole plate was applied and drilled into placed with 4 screws placed (two on each side of the fracture).  There was good reduction of the fracture and good occlusion.  The incision was closed with 4-0 Vicryl after reapproximating the muscle. The posterior angle fracture was mobile and therefore also plated in  the same fashion.  The incision was then closed with 4-0 Vicryl.  The patient tolerated the procedure well.  An NG was placed and removed. There were no complications. The patient was allowed to wake from anesthesia, extubated and taken to the recovery room in satisfactory condition. Wire cutters placed at the bedside.
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
## 2013-06-05 NOTE — Interval H&P Note (Signed)
History and Physical Interval Note:  06/05/2013 10:52 AM  Stanley [name]  has presented today for surgery, with the diagnosis of Closed fracture of unspecified site of mandible [802.20]  The various methods of treatment have been discussed with the patient and family. After consideration of risks, benefits and other options for treatment, the patient has consented to  Procedure(s): REPAIR OF MANDIBULAR FRACTURE (N/A) as a surgical intervention .  The patient's history has been reviewed, patient examined, no change in status, stable for surgery.  I have reviewed the patient's chart and labs.  Questions were answered to the patient's satisfaction.     SANGER,Alida Greiner

## 2013-06-06 DIAGNOSIS — S02609A Fracture of mandible, unspecified, initial encounter for closed fracture: Secondary | ICD-10-CM

## 2013-06-06 LAB — RENAL FUNCTION PANEL
ALBUMIN: 3 g/dL — AB (ref 3.5–5.2)
BUN: 59 mg/dL — ABNORMAL HIGH (ref 6–23)
CALCIUM: 8.8 mg/dL (ref 8.4–10.5)
CO2: 21 mEq/L (ref 19–32)
Chloride: 95 mEq/L — ABNORMAL LOW (ref 96–112)
Creatinine, Ser: 8.42 mg/dL — ABNORMAL HIGH (ref 0.50–1.35)
GFR, EST AFRICAN AMERICAN: 7 mL/min — AB (ref >90)
GFR, EST NON AFRICAN AMERICAN: 6 mL/min — AB (ref >90)
Glucose, Bld: 125 mg/dL — ABNORMAL HIGH (ref 70–99)
PHOSPHORUS: 6.9 mg/dL — AB (ref 2.3–4.6)
POTASSIUM: 5.2 meq/L (ref 3.7–5.3)
SODIUM: 135 meq/L — AB (ref 137–147)

## 2013-06-06 LAB — CBC
HCT: 34.7 % — ABNORMAL LOW (ref 39.0–52.0)
HEMOGLOBIN: 11.8 g/dL — AB (ref 13.0–17.0)
MCH: 32.7 pg (ref 26.0–34.0)
MCHC: 34 g/dL (ref 30.0–36.0)
MCV: 96.1 fL (ref 78.0–100.0)
PLATELETS: 217 10*3/uL (ref 150–400)
RBC: 3.61 MIL/uL — ABNORMAL LOW (ref 4.22–5.81)
RDW: 14.8 % (ref 11.5–15.5)
WBC: 10.7 10*3/uL — ABNORMAL HIGH (ref 4.0–10.5)

## 2013-06-06 MED ORDER — OXYCODONE HCL 5 MG PO TABS: 5.0000 mg | ORAL_TABLET | ORAL | Status: DC | PRN

## 2013-06-06 MED ORDER — CARBAMIDE PEROXIDE 10 % MT SOLN
Freq: Two times a day (BID) | OROMUCOSAL | Status: DC
  Filled 2013-06-06: qty 60

## 2013-06-06 MED ORDER — MORPHINE SULFATE 2 MG/ML IJ SOLN
INTRAMUSCULAR | Status: AC
  Administered 2013-06-06: 2 mg via INTRAVENOUS
  Filled 2013-06-06: qty 1
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
## 2013-06-06 NOTE — Progress Notes (Signed)
1 Day Post-Op  Subjective: Doing well following open reduction internal fixation with miniplate placement x 2 and maxillomandibular fixation yesterday. Edema and bruising about mandible as expected.  Pain under control.  Will need to continue a full liquid diet while MMF in place Will need follow up in the office in 2 weeks Needs to go home with wire cutters   Objective: Vital signs in last 24 hours: Temp:  [97 F (36.1 C)-100.5 F (38.1 C)] 97 F (36.1 C) (03/26 1120) Pulse Rate:  [78-100] 98 (03/26 1120) Resp:  [12-18] 18 (03/26 1120) BP: (119-159)/(72-96) 152/86 mmHg (03/26 1120) SpO2:  [92 %-100 %] 100 % (03/26 1120) Weight:  [80.4 kg (177 lb 4 oz)] 80.4 kg (177 lb 4 oz) (03/26 0713) Last BM Date: 06/01/13  Intake/Output from previous day: 03/25 0701 - 03/26 0700 In: 450 [I.V.:450] Out: 50 [Blood:50] Intake/Output this shift: Total I/O In: 0  Out: 2446 [Other:2446]  General appearance: alert, cooperative and no distress Edema and ecchymosis about mandible as expected. MMF intact  Lab Results:   Recent Labs  06/04/13 0720 06/06/13 0738  WBC 10.0 10.7*  HGB 14.3 11.8*  HCT 41.3 34.7*  PLT 181 217   BMET  Recent Labs  06/04/13 0720 06/06/13 0738  NA 139 135*  K 4.2 5.2  CL 95* 95*  CO2 23 21  GLUCOSE 106* 125*  BUN 35* 59*  CREATININE 5.85* 8.42*  CALCIUM 9.6 8.8   PT/INR No results found for this basename: LABPROT, INR,  in the last 72 hours ABG No results found for this basename: PHART, PCO2, PO2, HCO3,  in the last 72 hours  Studies/Results: No results found.  Anti-infectives: Anti-infectives   Start     Dose/Rate Route Frequency Ordered Stop   06/05/13 1230  ceFAZolin (ANCEF) IVPB 2 g/50 mL premix     2 g 100 mL/hr over 30 Minutes Intravenous  Once 06/05/13 1217 06/05/13 1204      Assessment/Plan: s/p Procedure(s): REPAIR OF MANDIBULAR FRACTURE x 2 with maxillo-mandibular fixation  (N/A) Okay for discharge from plastic surgery  standpoint. Satisfactory post operative course.   LOS: 4 days    Erikka Follmer,PA-C Plastic Surgery [phone]

## 2013-06-06 NOTE — Discharge Instructions (Signed)
Continue full liquid diet.  Take wire cutters home with you from hospital Follow up with Dr. Migdalia Dk as instructed- Call for follow up appointment [phone] Use mouthwash after every meal and at bedtime

## 2013-06-06 NOTE — Procedures (Signed)
Patient was seen on dialysis and the procedure was supervised.  BFR 400  Via AVG BP is  144/94.   Patient appears to be tolerating treatment well  Marium Ragan A 06/06/2013

## 2013-06-07 ENCOUNTER — Encounter (HOSPITAL_COMMUNITY): Payer: Self-pay | Admitting: Plastic Surgery

## 2013-06-08 NOTE — Progress Notes (Signed)
Pt discharged to home after visit summary reviewed and pt capable of re verbalizing medications and follow up appointments. Pt remains stable. No signs and symptoms of distress. Educated to return to ER in the event of SOB, dizziness, chest pain, or fainting. Bayne Fosnaugh, RN   

## 2013-06-11 NOTE — Progress Notes (Signed)
Agree with the above information.

## 2013-06-15 ENCOUNTER — Emergency Department: Payer: Self-pay | Admitting: Emergency Medicine

## 2013-06-16 ENCOUNTER — Observation Stay (HOSPITAL_COMMUNITY)
Admission: EM | Admit: 2013-06-16 | Discharge: 2013-06-21 | Disposition: A | Discharge status: Home / Self Care | Payer: Medicare Other | Attending: Internal Medicine | Admitting: Internal Medicine

## 2013-06-16 ENCOUNTER — Encounter (HOSPITAL_COMMUNITY): Payer: Self-pay | Admitting: Emergency Medicine

## 2013-06-16 DIAGNOSIS — E039 Hypothyroidism, unspecified: Secondary | ICD-10-CM | POA: Insufficient documentation

## 2013-06-16 DIAGNOSIS — R45851 Suicidal ideations: Secondary | ICD-10-CM

## 2013-06-16 DIAGNOSIS — E785 Hyperlipidemia, unspecified: Secondary | ICD-10-CM | POA: Insufficient documentation

## 2013-06-16 DIAGNOSIS — IMO0001 Reserved for inherently not codable concepts without codable children: Secondary | ICD-10-CM | POA: Insufficient documentation

## 2013-06-16 DIAGNOSIS — I2510 Atherosclerotic heart disease of native coronary artery without angina pectoris: Secondary | ICD-10-CM | POA: Insufficient documentation

## 2013-06-16 DIAGNOSIS — F17200 Nicotine dependence, unspecified, uncomplicated: Secondary | ICD-10-CM | POA: Insufficient documentation

## 2013-06-16 DIAGNOSIS — I120 Hypertensive chronic kidney disease with stage 5 chronic kidney disease or end stage renal disease: Secondary | ICD-10-CM | POA: Insufficient documentation

## 2013-06-16 DIAGNOSIS — Z951 Presence of aortocoronary bypass graft: Secondary | ICD-10-CM | POA: Insufficient documentation

## 2013-06-16 DIAGNOSIS — S02609A Fracture of mandible, unspecified, initial encounter for closed fracture: Secondary | ICD-10-CM

## 2013-06-16 DIAGNOSIS — R45850 Homicidal ideations: Principal | ICD-10-CM

## 2013-06-16 DIAGNOSIS — S02609B Fracture of mandible, unspecified, initial encounter for open fracture: Secondary | ICD-10-CM

## 2013-06-16 DIAGNOSIS — E875 Hyperkalemia: Secondary | ICD-10-CM | POA: Insufficient documentation

## 2013-06-16 DIAGNOSIS — F430 Acute stress reaction: Secondary | ICD-10-CM | POA: Insufficient documentation

## 2013-06-16 DIAGNOSIS — N186 End stage renal disease: Secondary | ICD-10-CM | POA: Insufficient documentation

## 2013-06-16 DIAGNOSIS — F1210 Cannabis abuse, uncomplicated: Secondary | ICD-10-CM | POA: Insufficient documentation

## 2013-06-16 DIAGNOSIS — Z7982 Long term (current) use of aspirin: Secondary | ICD-10-CM | POA: Insufficient documentation

## 2013-06-16 DIAGNOSIS — Z992 Dependence on renal dialysis: Secondary | ICD-10-CM | POA: Diagnosis present

## 2013-06-16 DIAGNOSIS — F1410 Cocaine abuse, uncomplicated: Secondary | ICD-10-CM | POA: Insufficient documentation

## 2013-06-16 DIAGNOSIS — R0602 Shortness of breath: Secondary | ICD-10-CM | POA: Insufficient documentation

## 2013-06-16 LAB — BASIC METABOLIC PANEL
BUN: 35 mg/dL — ABNORMAL HIGH (ref 6–23)
BUN: 29 mg/dL — ABNORMAL HIGH (ref 6–23)
CALCIUM: 9.3 mg/dL (ref 8.4–10.5)
CO2: 19 meq/L (ref 19–32)
CO2: 19 mEq/L (ref 19–32)
CREATININE: 7.49 mg/dL — AB (ref 0.50–1.35)
Calcium: 9.1 mg/dL (ref 8.4–10.5)
Chloride: 106 mEq/L (ref 96–112)
Chloride: 100 mEq/L (ref 96–112)
Creatinine, Ser: 6.05 mg/dL — ABNORMAL HIGH (ref 0.50–1.35)
GFR calc Af Amer: 11 mL/min — ABNORMAL LOW (ref >90)
GFR calc non Af Amer: 7 mL/min — ABNORMAL LOW (ref >90)
GFR calc non Af Amer: 9 mL/min — ABNORMAL LOW (ref >90)
GFR, EST AFRICAN AMERICAN: 8 mL/min — AB (ref >90)
Glucose, Bld: 56 mg/dL — ABNORMAL LOW (ref 70–99)
Glucose, Bld: 96 mg/dL (ref 70–99)
Potassium: 6.5 mEq/L (ref 3.7–5.3)
Potassium: 4.6 mEq/L (ref 3.7–5.3)
SODIUM: 142 meq/L (ref 137–147)
Sodium: 138 mEq/L (ref 137–147)

## 2013-06-16 LAB — CBC WITH DIFFERENTIAL/PLATELET
BASOS ABS: 0.1 10*3/uL (ref 0.0–0.1)
BASOS PCT: 1 % (ref 0–1)
EOS PCT: 2 % (ref 0–5)
Eosinophils Absolute: 0.2 10*3/uL (ref 0.0–0.7)
HCT: 37.7 % — ABNORMAL LOW (ref 39.0–52.0)
Hemoglobin: 12.2 g/dL — ABNORMAL LOW (ref 13.0–17.0)
Lymphocytes Relative: 24 % (ref 12–46)
Lymphs Abs: 2.4 10*3/uL (ref 0.7–4.0)
MCH: 32 pg (ref 26.0–34.0)
MCHC: 32.4 g/dL (ref 30.0–36.0)
MCV: 99 fL (ref 78.0–100.0)
Monocytes Absolute: 0.9 10*3/uL (ref 0.1–1.0)
Monocytes Relative: 9 % (ref 3–12)
Neutro Abs: 6.6 10*3/uL (ref 1.7–7.7)
Neutrophils Relative %: 64 % (ref 43–77)
PLATELETS: 370 10*3/uL (ref 150–400)
RBC: 3.81 MIL/uL — ABNORMAL LOW (ref 4.22–5.81)
RDW: 15.2 % (ref 11.5–15.5)
WBC: 10.2 10*3/uL (ref 4.0–10.5)

## 2013-06-16 LAB — CBG MONITORING, ED
GLUCOSE-CAPILLARY: 26 mg/dL — AB (ref 70–99)
Glucose-Capillary: 123 mg/dL — ABNORMAL HIGH (ref 70–99)

## 2013-06-16 MED ORDER — PENTAFLUOROPROP-TETRAFLUOROETH EX AERO: 1.0000 "application " | INHALATION_SPRAY | CUTANEOUS | Status: DC | PRN

## 2013-06-16 MED ORDER — SODIUM CHLORIDE 0.9 % IV SOLN: 100.0000 mL | INTRAVENOUS | Status: DC | PRN

## 2013-06-16 MED ORDER — HEPARIN SODIUM (PORCINE) 1000 UNIT/ML DIALYSIS
1000.0000 [IU] | INTRAMUSCULAR | Status: DC | PRN
  Filled 2013-06-16: qty 1

## 2013-06-16 MED ORDER — LIDOCAINE-PRILOCAINE 2.5-2.5 % EX CREA: 1.0000 "application " | TOPICAL_CREAM | CUTANEOUS | Status: DC | PRN

## 2013-06-16 MED ORDER — LIDOCAINE HCL (PF) 1 % IJ SOLN: 5.0000 mL | INTRAMUSCULAR | Status: DC | PRN

## 2013-06-16 MED ORDER — DEXTROSE 50 % IV SOLN
INTRAVENOUS | Status: AC
  Filled 2013-06-16: qty 50

## 2013-06-16 MED ORDER — ALTEPLASE 2 MG IJ SOLR: 2.0000 mg | Freq: Once | INTRAMUSCULAR | Status: DC | PRN

## 2013-06-16 MED ORDER — ALTEPLASE 2 MG IJ SOLR
2.0000 mg | Freq: Once | INTRAMUSCULAR | Status: AC | PRN
  Filled 2013-06-16: qty 2

## 2013-06-16 MED ORDER — INSULIN ASPART 100 UNIT/ML IV SOLN
10.0000 [IU] | Freq: Once | INTRAVENOUS | Status: AC
  Administered 2013-06-16: 10 [IU] via INTRAVENOUS
  Filled 2013-06-16: qty 0.1

## 2013-06-16 MED ORDER — NEPRO/CARBSTEADY PO LIQD
237.0000 mL | ORAL | Status: DC | PRN
Start: 2013-06-16 — End: 2013-06-18
  Filled 2013-06-16: qty 237

## 2013-06-16 MED ORDER — PENTAFLUOROPROP-TETRAFLUOROETH EX AERO
1.0000 "application " | INHALATION_SPRAY | CUTANEOUS | Status: DC | PRN
  Filled 2013-06-16: qty 103.5

## 2013-06-16 MED ORDER — LIDOCAINE-PRILOCAINE 2.5-2.5 % EX CREA
1.0000 "application " | TOPICAL_CREAM | CUTANEOUS | Status: DC | PRN
  Filled 2013-06-16: qty 5

## 2013-06-16 MED ORDER — ALBUTEROL SULFATE (2.5 MG/3ML) 0.083% IN NEBU
10.0000 mg | INHALATION_SOLUTION | Freq: Once | RESPIRATORY_TRACT | Status: AC
  Administered 2013-06-16: 10 mg via RESPIRATORY_TRACT
  Filled 2013-06-16 (×2): qty 12

## 2013-06-16 MED ORDER — SODIUM POLYSTYRENE SULFONATE 15 GM/60ML PO SUSP
30.0000 g | Freq: Once | ORAL | Status: AC
  Administered 2013-06-16: 30 g via ORAL
  Filled 2013-06-16: qty 120

## 2013-06-16 MED ORDER — HEPARIN SODIUM (PORCINE) 1000 UNIT/ML DIALYSIS
20.0000 [IU]/kg | INTRAMUSCULAR | Status: DC | PRN
  Filled 2013-06-16: qty 2

## 2013-06-16 MED ORDER — HEPARIN SODIUM (PORCINE) 1000 UNIT/ML DIALYSIS
20.0000 [IU]/kg | INTRAMUSCULAR | Status: DC | PRN
Start: 2013-06-16 — End: 2013-06-18
  Filled 2013-06-16: qty 2

## 2013-06-16 MED ORDER — HEPARIN SODIUM (PORCINE) 1000 UNIT/ML DIALYSIS: 1000.0000 [IU] | INTRAMUSCULAR | Status: DC | PRN

## 2013-06-16 MED ORDER — NEPRO/CARBSTEADY PO LIQD: 237.0000 mL | ORAL | Status: DC | PRN

## 2013-06-16 NOTE — ED Notes (Signed)
Pt alert and oriented x's 3.  D50 given.  Pt has ambulated to bathroom 4'x for BM
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
## 2013-06-16 NOTE — ED Notes (Signed)
Pt reports having surgery on his jaw two weeks ago, was told that they would show him how to cut the wires out of his mouth if he needed to but was never shown how to do it. Having nausea and attempted to cut wires out himself so he could vomit, reports he has messed up the wires and also has a dental abscess from the wire irritating him. Airway intact.

## 2013-06-16 NOTE — ED Notes (Signed)
Resp therapist called for continuous neb treatment.

## 2013-06-16 NOTE — ED Notes (Signed)
Patient at dialysis per Romie Minus
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
## 2013-06-16 NOTE — ED Notes (Signed)
Tele psych in progress at this time.

## 2013-06-16 NOTE — ED Notes (Signed)
Pt states that he is not going to dialysis any more pt states that he went yesterday "for one hour, but I made them stop"

## 2013-06-16 NOTE — ED Notes (Signed)
PA at bedside.
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
## 2013-06-16 NOTE — ED Notes (Signed)
Upon arrival to room, patient requested this RN to remove the wires from his mouth. Informed patient that I could not remove the wires as they were placed by an MD. Patient upset. Visual inspection of mouth did not show any wires. Hardware visible in gum line, but wires appear to be missing. Airway patent and unobstructed. Patient refusing vital sign measurement at this time. Sitter at bedside.

## 2013-06-16 NOTE — ED Notes (Signed)
Lab called with pt potassium of 6.5; will report to Dr. Karle Starch

## 2013-06-16 NOTE — ED Notes (Signed)
[employer] here with IVC papers,  Pt notified of same by [employer].  Pt voices understanding.  Blue Scrubs given to pt.

## 2013-06-16 NOTE — ED Notes (Signed)
IV team paged at this time
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
## 2013-06-16 NOTE — ED Notes (Signed)
Pt receiving breathing tx at this time 

## 2013-06-16 NOTE — ED Notes (Signed)
Full liquid diet ordered for pt.
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
## 2013-06-17 LAB — CBG MONITORING, ED
GLUCOSE-CAPILLARY: 78 mg/dL (ref 70–99)
Glucose-Capillary: 119 mg/dL — ABNORMAL HIGH (ref 70–99)

## 2013-06-17 MED ORDER — ACETAMINOPHEN 325 MG PO TABS
650.0000 mg | ORAL_TABLET | ORAL | Status: DC | PRN
  Administered 2013-06-17 – 2013-06-19 (×8): 650 mg via ORAL
  Filled 2013-06-17 (×6): qty 2

## 2013-06-17 MED ORDER — NICOTINE 21 MG/24HR TD PT24
21.0000 mg | MEDICATED_PATCH | Freq: Every day | TRANSDERMAL | Status: DC
  Administered 2013-06-17 – 2013-06-21 (×5): 21 mg via TRANSDERMAL
  Filled 2013-06-17 (×5): qty 1

## 2013-06-17 NOTE — ED Notes (Signed)
Patient is resting comfortably. 

## 2013-06-17 NOTE — ED Notes (Signed)
Pt requesting mashed potatoes, he is unable to chew the food on his plate. Even though soft diet was ordered. Ordered mashed potatoes for pt.

## 2013-06-17 NOTE — ED Notes (Signed)
Pt has male visitor at bedside.

## 2013-06-17 NOTE — ED Notes (Signed)
Patient belongings inventoried and secured in container.

## 2013-06-17 NOTE — ED Notes (Signed)
1 amp of D50 given IV

## 2013-06-17 NOTE — ED Notes (Signed)
EDP, [place], aware of pt pulled wires out of mouth from surgery on open mandible fx on 3/25 and wanting them removed. EDP aware no bleeding or signs of infection noted.

## 2013-06-17 NOTE — BH Assessment (Signed)
[facility] referral initiated by MHT; Waiting for callback from [facility] with authorization.  Boyce Medici, MA  Disposition MHT
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
## 2013-06-17 NOTE — ED Notes (Signed)
Pt watching TV; no signs of distress noted.
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
## 2013-06-17 NOTE — BH Assessment (Signed)
MHT spoke with [facility] and [facility]. [facility] authorized pt for 7 days @ [place]; Auth.# [id number] MHT faxed referral to [facility].  Boyce Medici, MA  Disposition MHT

## 2013-06-17 NOTE — ED Notes (Signed)
Patient agitated and requesting to go home. Patient aware he has been involuntarily committed and will not be able to leave until being cleared by psychiatry.

## 2013-06-17 NOTE — BH Assessment (Addendum)
[facility] at capacity. Contacted the following facilities for placement:  [facility]: Beds available. Faxed clinical information.  [facility]: Beds available. Faxed clinical information  [facility]: Beds available. Faxed clinical information [facility]: Beds available. Faxed clinical information  [facility]: Beds available. Faxed clinical information  [facility]: Beds available. Faxed clinical information   [facility]: At capacity per [facility]: At capacity per [facility]: At capacity per [facility]: At capacity per [facility]: At capacity per [facility]: At capacity per [facility]: At capacity per [facility]: At capacity per [facility]: At capacity per [facility]: At capacity per [facility]: At capacity per [facility]: At capacity per [facility] At capacity per [facility]: At capacity per [facility]: At capacity per Fransico Him, [facility], [facility] Triage Specialist [phone]

## 2013-06-17 NOTE — ED Notes (Signed)
PT eatting soft meal tray; states he cut wires with wire cutters they gave him in case he had to vomit. States one came loose and he kept working on it and got it out.

## 2013-06-17 NOTE — BH Assessment (Signed)
MHT spoke with Richardson Landry at [facility] for a verbal intake for pt. Per Richardson Landry, he will get the MD to review the referral.  -Boyce Medici, MA  Disposition MHT

## 2013-06-17 NOTE — ED Notes (Signed)
Soft, renal lunch tray ordered.

## 2013-06-17 NOTE — BH Assessment (Signed)
[facility] - under review, per Vania Rea. [facility] - per [place], referral has not been reviewed yet. [facility] asked that pt's referral be re-faxed. MHT re-faxed referral for pt.  Boyce Medici, MA  Disposition MHT

## 2013-06-18 LAB — URINE MICROSCOPIC-ADD ON

## 2013-06-18 LAB — RENAL FUNCTION PANEL
ALBUMIN: 3.1 g/dL — AB (ref 3.5–5.2)
BUN: 31 mg/dL — AB (ref 6–23)
CHLORIDE: 103 meq/L (ref 96–112)
CO2: 19 mEq/L (ref 19–32)
Calcium: 9 mg/dL (ref 8.4–10.5)
Creatinine, Ser: 6.87 mg/dL — ABNORMAL HIGH (ref 0.50–1.35)
GFR calc non Af Amer: 8 mL/min — ABNORMAL LOW (ref >90)
GFR, EST AFRICAN AMERICAN: 9 mL/min — AB (ref >90)
GLUCOSE: 125 mg/dL — AB (ref 70–99)
Phosphorus: 3.7 mg/dL (ref 2.3–4.6)
Potassium: 4.6 mEq/L (ref 3.7–5.3)
SODIUM: 139 meq/L (ref 137–147)

## 2013-06-18 LAB — CBC
HEMATOCRIT: 36.8 % — AB (ref 39.0–52.0)
HEMOGLOBIN: 12.7 g/dL — AB (ref 13.0–17.0)
MCH: 33.4 pg (ref 26.0–34.0)
MCHC: 34.5 g/dL (ref 30.0–36.0)
MCV: 96.8 fL (ref 78.0–100.0)
Platelets: 347 10*3/uL (ref 150–400)
RBC: 3.8 MIL/uL — ABNORMAL LOW (ref 4.22–5.81)
RDW: 14.9 % (ref 11.5–15.5)
WBC: 11.3 10*3/uL — ABNORMAL HIGH (ref 4.0–10.5)

## 2013-06-18 LAB — URINALYSIS, ROUTINE W REFLEX MICROSCOPIC
Bilirubin Urine: NEGATIVE
Glucose, UA: NEGATIVE mg/dL
Ketones, ur: NEGATIVE mg/dL
Leukocytes, UA: NEGATIVE
Nitrite: NEGATIVE
Protein, ur: >300 mg/dL — AB
Specific Gravity, Urine: 1.008 (ref 1.005–1.030)
Urobilinogen, UA: 0.2 mg/dL (ref 0.0–1.0)
pH: 8 (ref 5.0–8.0)

## 2013-06-18 LAB — RAPID URINE DRUG SCREEN, HOSP PERFORMED
Amphetamines: NOT DETECTED
Barbiturates: NOT DETECTED
Benzodiazepines: NOT DETECTED
Cocaine: POSITIVE — AB
Opiates: NOT DETECTED
Tetrahydrocannabinol: NOT DETECTED

## 2013-06-18 MED ORDER — ZOLPIDEM TARTRATE 5 MG PO TABS
5.0000 mg | ORAL_TABLET | Freq: Once | ORAL | Status: AC
  Administered 2013-06-18: 5 mg via ORAL
  Filled 2013-06-18: qty 1

## 2013-06-18 MED ORDER — ACETAMINOPHEN 325 MG PO TABS
ORAL_TABLET | ORAL | Status: AC
  Filled 2013-06-18: qty 2

## 2013-06-18 NOTE — ED Notes (Signed)
Pt's visitor states he will do dialysis today if they can fit him in.

## 2013-06-18 NOTE — Progress Notes (Signed)
Per Robin[place], the patient is on the wait list at [facility] and he is currently under medical review for admission.

## 2013-06-18 NOTE — Progress Notes (Signed)
Hemodialysis- Pt signed off AMA after 2.5 hours d/t pain in jaw and being generally uncomfortable. Will transport back to ED with Sitter. Vitals stable.

## 2013-06-18 NOTE — BH Assessment (Signed)
MHT spoke with Ishmael Holter at [facility] to verify receipt of urinalysis and drug screen that was faxed. Per Ishmael Holter, the MD will review.  -Boyce Medici, MA  Disposition MHT

## 2013-06-18 NOTE — ED Notes (Signed)
Patient will need to be admitted to have dialysis treatment

## 2013-06-18 NOTE — ED Notes (Signed)
PT'S SISTER - JOSIE - OC:[phone] - CALL HER IF PT REFUSES DIALYSIS OR GIVES ANY OTHER PROBLEMS, PER HER REQUEST.

## 2013-06-18 NOTE — Progress Notes (Signed)
Subjective: Interval History: 53 yr old male, with ESRD dialyzed at [facility].  Here 3/22-3/26 with traumatic mandibular fx, repaired.  Returned after s/o HD on 4/4 so he could get sick and was declared Homicidal and Suicidal.  Dialyzed here on 4/5 and harbored in ER still with refusals for commitment from [facility] and still seeking placement.  Now to be admitted for HD.         Not SOB, no CP, edema, fevers, N, V, D just pain at jaw wires. Constitutional: negative Eyes: negative Ears, nose, mouth, throat, and face: negative Respiratory: negative Cardiovascular: negative Gastrointestinal: negative Genitourinary:negative Integument/breast: negative Hematologic/lymphatic: negative Musculoskeletal:negative Neurological: negative Behavioral/Psych: as above   Objective: Vital signs in last 24 hours: Temp:  [98 F (36.7 C)-99.2 F (37.3 C)] 98.5 F (36.9 C) (04/07 1155) Pulse Rate:  [76-84] 76 (04/07 1155) Resp:  [12-20] 12 (04/07 1155) BP: (118-152)/(77-94) 123/79 mmHg (04/07 1155) SpO2:  [98 %-100 %] 99 % (04/07 1155) Weight change:   Intake/Output from previous day: 04/06 0701 - 04/07 0700 In: 240 [P.O.:240] Out: 1 [Stool:1] Intake/Output this shift:  PE.  Heent art narrowing, scarring  In fundi, subconjunctival hemorr on L. Has metal brace on lower L appears bent Neck PCL, Lungs, clear, decreased BS CV gr 2/6 Holosys M, LV lift, PMI 12 cm lat to MSL Abdm liver down 5 cm, pos bs Chest wall Sternotomy scar Skin dry Extrem RUA AVF B&T    Lab Results:  Recent Labs  06/16/13 1325  WBC 10.2  HGB 12.2*  HCT 37.7*  PLT 370   BMET:  Recent Labs  06/16/13 1325 06/16/13 2118  NA 142 138  K 6.5* 4.6  CL 106 100  CO2 19 19  GLUCOSE 56* 96  BUN 35* 29*  CREATININE 7.49* 6.05*  CALCIUM 9.3 9.1   No results found for this basename: PTH,  in the last 72 hours Iron Studies: No results found for this basename: IRON, TIBC, TRANSFERRIN, FERRITIN,  in the last 72  hours  Studies/Results: No results found.  I have reviewed the patient's current medications. Prior to Admission:  (Not in a hospital admission)  Assessment/Plan: 1 ESRD admitted or could go to his outpatient unit with escort.  Will lower vol, and see if can get clearance 2 HTN lower vol 3 Anemia not an issue 4 Suicidal behavior/Homicidal behavior 5 Mandib fx F/u with Dr who op 6 HPTH P HD, resume meds.    LOS: 2 days   Herberta Pickron L 06/18/2013,3:04 PM

## 2013-06-18 NOTE — BH Assessment (Signed)
MHT spoke with [place] and was asked that a drug screen and urinalysis be faxed for the MD to review. MHT notified pt's nurse. MHT will fax results to [facility] when they are completed.  Boyce Medici, MA  Disposition MHT

## 2013-06-18 NOTE — ED Notes (Signed)
Applesauce and sherbert given to patient.

## 2013-06-19 ENCOUNTER — Encounter (HOSPITAL_COMMUNITY): Payer: Self-pay | Admitting: Emergency Medicine

## 2013-06-19 DIAGNOSIS — R45851 Suicidal ideations: Secondary | ICD-10-CM

## 2013-06-19 DIAGNOSIS — R45850 Homicidal ideations: Principal | ICD-10-CM

## 2013-06-19 DIAGNOSIS — S02609A Fracture of mandible, unspecified, initial encounter for closed fracture: Secondary | ICD-10-CM

## 2013-06-19 DIAGNOSIS — N186 End stage renal disease: Secondary | ICD-10-CM

## 2013-06-19 LAB — CBC
HEMATOCRIT: 42.1 % (ref 39.0–52.0)
Hemoglobin: 14.4 g/dL (ref 13.0–17.0)
MCH: 33 pg (ref 26.0–34.0)
MCHC: 34.2 g/dL (ref 30.0–36.0)
MCV: 96.3 fL (ref 78.0–100.0)
PLATELETS: 370 10*3/uL (ref 150–400)
RBC: 4.37 MIL/uL (ref 4.22–5.81)
RDW: 14.7 % (ref 11.5–15.5)
WBC: 14.8 10*3/uL — AB (ref 4.0–10.5)

## 2013-06-19 LAB — CBG MONITORING, ED: Glucose-Capillary: 102 mg/dL — ABNORMAL HIGH (ref 70–99)

## 2013-06-19 LAB — CREATININE, SERUM
Creatinine, Ser: 6.72 mg/dL — ABNORMAL HIGH (ref 0.50–1.35)
GFR calc Af Amer: 10 mL/min — ABNORMAL LOW (ref >90)
GFR calc non Af Amer: 8 mL/min — ABNORMAL LOW (ref >90)

## 2013-06-19 MED ORDER — HEPARIN SODIUM (PORCINE) 1000 UNIT/ML DIALYSIS
1000.0000 [IU] | INTRAMUSCULAR | Status: DC | PRN
  Filled 2013-06-19: qty 1

## 2013-06-19 MED ORDER — HEPARIN SODIUM (PORCINE) 1000 UNIT/ML DIALYSIS
40.0000 [IU]/kg | Freq: Once | INTRAMUSCULAR | Status: DC
  Filled 2013-06-19: qty 4

## 2013-06-19 MED ORDER — HYDROCODONE-ACETAMINOPHEN 5-325 MG PO TABS: 1.0000 | ORAL_TABLET | Freq: Four times a day (QID) | ORAL | Status: DC | PRN

## 2013-06-19 MED ORDER — GABAPENTIN 300 MG PO CAPS
300.0000 mg | ORAL_CAPSULE | Freq: Every day | ORAL | Status: DC
  Administered 2013-06-19 – 2013-06-20 (×2): 300 mg via ORAL
  Filled 2013-06-19 (×3): qty 1

## 2013-06-19 MED ORDER — ISOSORBIDE MONONITRATE ER 60 MG PO TB24
60.0000 mg | ORAL_TABLET | Freq: Every day | ORAL | Status: DC
  Administered 2013-06-19 – 2013-06-20 (×2): 60 mg via ORAL
  Filled 2013-06-19 (×3): qty 1

## 2013-06-19 MED ORDER — LIDOCAINE-PRILOCAINE 2.5-2.5 % EX CREA: 1.0000 "application " | TOPICAL_CREAM | CUTANEOUS | Status: DC | PRN

## 2013-06-19 MED ORDER — ACETAMINOPHEN 325 MG PO TABS: 650.0000 mg | ORAL_TABLET | Freq: Four times a day (QID) | ORAL | Status: DC | PRN

## 2013-06-19 MED ORDER — AMLODIPINE BESYLATE 5 MG PO TABS
5.0000 mg | ORAL_TABLET | Freq: Every day | ORAL | Status: DC
  Administered 2013-06-19: 5 mg via ORAL
  Filled 2013-06-19: qty 1

## 2013-06-19 MED ORDER — OXYCODONE-ACETAMINOPHEN 5-325 MG PO TABS
2.0000 | ORAL_TABLET | Freq: Four times a day (QID) | ORAL | Status: DC | PRN
  Administered 2013-06-19: 2 via ORAL
  Filled 2013-06-19: qty 2

## 2013-06-19 MED ORDER — OXYCODONE-ACETAMINOPHEN 5-325 MG PO TABS
2.0000 | ORAL_TABLET | ORAL | Status: DC | PRN
  Administered 2013-06-19 – 2013-06-21 (×7): 2 via ORAL
  Filled 2013-06-19 (×7): qty 2

## 2013-06-19 MED ORDER — ADULT MULTIVITAMIN W/MINERALS CH
1.0000 | ORAL_TABLET | Freq: Every day | ORAL | Status: DC
  Administered 2013-06-19: 1 via ORAL
  Filled 2013-06-19: qty 1

## 2013-06-19 MED ORDER — HEPARIN SODIUM (PORCINE) 1000 UNIT/ML DIALYSIS
100.0000 [IU]/kg | INTRAMUSCULAR | Status: DC | PRN
  Administered 2013-06-20: 8000 [IU] via INTRAVENOUS_CENTRAL
  Filled 2013-06-19: qty 8

## 2013-06-19 MED ORDER — LABETALOL HCL 200 MG PO TABS
200.0000 mg | ORAL_TABLET | Freq: Every morning | ORAL | Status: DC
  Administered 2013-06-19: 200 mg via ORAL
  Filled 2013-06-19: qty 1

## 2013-06-19 MED ORDER — ATORVASTATIN CALCIUM 40 MG PO TABS
40.0000 mg | ORAL_TABLET | Freq: Every day | ORAL | Status: DC
  Administered 2013-06-19 – 2013-06-20 (×2): 40 mg via ORAL
  Filled 2013-06-19 (×4): qty 1

## 2013-06-19 MED ORDER — ALTEPLASE 2 MG IJ SOLR
2.0000 mg | Freq: Once | INTRAMUSCULAR | Status: AC | PRN
  Filled 2013-06-19: qty 2

## 2013-06-19 MED ORDER — BENAZEPRIL HCL 10 MG PO TABS
10.0000 mg | ORAL_TABLET | Freq: Every day | ORAL | Status: DC
  Administered 2013-06-19: 10 mg via ORAL
  Filled 2013-06-19: qty 1

## 2013-06-19 MED ORDER — AMLODIPINE BESYLATE 5 MG PO TABS
5.0000 mg | ORAL_TABLET | Freq: Every day | ORAL | Status: DC
  Filled 2013-06-19: qty 1

## 2013-06-19 MED ORDER — ASPIRIN EC 81 MG PO TBEC
81.0000 mg | DELAYED_RELEASE_TABLET | Freq: Every day | ORAL | Status: DC
  Administered 2013-06-19 – 2013-06-21 (×3): 81 mg via ORAL
  Filled 2013-06-19 (×3): qty 1

## 2013-06-19 MED ORDER — MORPHINE SULFATE 2 MG/ML IJ SOLN
2.0000 mg | INTRAMUSCULAR | Status: DC | PRN
  Administered 2013-06-19: 2 mg via INTRAVENOUS
  Filled 2013-06-19: qty 1

## 2013-06-19 MED ORDER — ACETAMINOPHEN 650 MG RE SUPP: 650.0000 mg | Freq: Four times a day (QID) | RECTAL | Status: DC | PRN

## 2013-06-19 MED ORDER — SODIUM CHLORIDE 0.9 % IJ SOLN
3.0000 mL | Freq: Two times a day (BID) | INTRAMUSCULAR | Status: DC
  Administered 2013-06-19 – 2013-06-20 (×2): 3 mL via INTRAVENOUS

## 2013-06-19 MED ORDER — NEPRO/CARBSTEADY PO LIQD: 237.0000 mL | ORAL | Status: DC | PRN

## 2013-06-19 MED ORDER — SODIUM CHLORIDE 0.9 % IV SOLN: 100.0000 mL | INTRAVENOUS | Status: DC | PRN

## 2013-06-19 MED ORDER — SODIUM CHLORIDE 0.9 % IV SOLN: 250.0000 mL | INTRAVENOUS | Status: DC | PRN

## 2013-06-19 MED ORDER — PANTOPRAZOLE SODIUM 40 MG PO TBEC
40.0000 mg | DELAYED_RELEASE_TABLET | Freq: Every day | ORAL | Status: DC
  Administered 2013-06-19 – 2013-06-21 (×3): 40 mg via ORAL
  Filled 2013-06-19 (×3): qty 1

## 2013-06-19 MED ORDER — LIDOCAINE HCL (PF) 1 % IJ SOLN: 5.0000 mL | INTRAMUSCULAR | Status: DC | PRN

## 2013-06-19 MED ORDER — CALCIUM ACETATE 667 MG PO CAPS
2001.0000 mg | ORAL_CAPSULE | Freq: Three times a day (TID) | ORAL | Status: DC
  Administered 2013-06-19 – 2013-06-21 (×5): 2001 mg via ORAL
  Filled 2013-06-19 (×9): qty 3

## 2013-06-19 MED ORDER — SODIUM CHLORIDE 0.9 % IJ SOLN: 3.0000 mL | INTRAMUSCULAR | Status: DC | PRN

## 2013-06-19 MED ORDER — PENTAFLUOROPROP-TETRAFLUOROETH EX AERO: 1.0000 "application " | INHALATION_SPRAY | CUTANEOUS | Status: DC | PRN

## 2013-06-19 MED ORDER — RENA-VITE PO TABS
1.0000 | ORAL_TABLET | Freq: Every day | ORAL | Status: DC
  Administered 2013-06-19: 1 via ORAL
  Filled 2013-06-19 (×3): qty 1

## 2013-06-19 MED ORDER — ENOXAPARIN SODIUM 30 MG/0.3ML ~~LOC~~ SOLN
30.0000 mg | SUBCUTANEOUS | Status: DC
  Administered 2013-06-19 – 2013-06-21 (×3): 30 mg via SUBCUTANEOUS
  Filled 2013-06-19 (×3): qty 0.3

## 2013-06-19 MED ORDER — HEPARIN SODIUM (PORCINE) 5000 UNIT/ML IJ SOLN: 5000.0000 [IU] | Freq: Three times a day (TID) | INTRAMUSCULAR | Status: DC

## 2013-06-19 NOTE — Progress Notes (Signed)
Subjective: Interval History: none.  Objective: Vital signs in last 24 hours: Temp:  [97.7 F (36.5 C)-98.5 F (36.9 C)] 98.5 F (36.9 C) (04/08 1126) Pulse Rate:  [76-107] 82 (04/08 1126) Resp:  [16-20] 20 (04/08 1126) BP: (98-151)/(57-102) 98/81 mmHg (04/08 1126) SpO2:  [96 %-100 %] 100 % (04/08 1126) Weight:  [76.7 kg (169 lb 1.5 oz)-77.7 kg (171 lb 4.8 oz)] 77.7 kg (171 lb 4.8 oz) (04/08 1126) Weight change:   Intake/Output from previous day: 04/07 0701 - 04/08 0700 In: -  Out: 2302  Intake/Output this shift:    General appearance: alert and cooperative Resp: diminished breath sounds bilaterally Cardio: regular rate and rhythm and systolic murmur: systolic ejection 2/6, decrescendo at 2nd left intercostal space GI: pos bs, liver down 5cm Extremities: AVF RA B&T  Lab Results:  Recent Labs  06/16/13 1325 06/18/13 1641  WBC 10.2 11.3*  HGB 12.2* 12.7*  HCT 37.7* 36.8*  PLT 370 347   BMET:  Recent Labs  06/16/13 2118 06/18/13 1641  NA 138 139  K 4.6 4.6  CL 100 103  CO2 19 19  GLUCOSE 96 125*  BUN 29* 31*  CREATININE 6.05* 6.87*  CALCIUM 9.1 9.0   No results found for this basename: PTH,  in the last 72 hours Iron Studies: No results found for this basename: IRON, TIBC, TRANSFERRIN, FERRITIN,  in the last 72 hours  Studies/Results: No results found.  I have reviewed the patient's current medications.  Assessment/Plan: 1 ESRD for HD TTS 2 HTn better, lower meds and time meds 3 Mandib fx.  4 CAD 5 HPTH no value P HD, lower bp meds.    LOS: 3 days   Joyice Faster Daleyza Gadomski 06/19/2013,12:27 PM

## 2013-06-19 NOTE — Progress Notes (Addendum)
Late entry:  CSW was informed that pt has been receiving dialysis out in the community and if he pt is to go [facility]), for psych placement, [facility] is requesting that CSW provide a dialysis center near [place], [place]. [place] provided list of 3 facilities near [place], [place] to attending RN. CSW spoke with pt to inform him of plan. Pt voiced understanding and stated that he has mouth pain. CSW shared this information with attending RN. TTS continues to work on placement.   [address], [place]

## 2013-06-19 NOTE — BH Assessment (Signed)
Per shift report, patient is on the wait list for [facility] and also pending medical review. Writer has discussed with case manager-Wanda this patient's disposition. Mariann Laster further inquired about patient's space on the wait list.   Writer contacted [facility] to obtain updated information regarding this patient's status on the wait list. Spoke to Taylors at [facility] and she confirmed that patient is in fact NOT on the wait list. Says that patient must have the following before he will be considered or placed on the wait list:  1.  [facility] staff must coordinate Dyalsis services.  2.  Coordination of Dyalsis services must be at a clinic close to [facility] (preferably in the same county).  3.  Must have the name/number of the administrator in charge of the clinic that patient will be receiving his services.  Writer shared the above information with the case Freight forwarder. She sts that patient will be placed on the medical floor.  Information will be passed to the LCSW working with patient throughout his stay as a medical admit.   Mariann Laster also suggested that patient has a face to face psych consultation. Writer encouraged her to discuss this with patient's new attending physician and have him/her call their request in to TTS.

## 2013-06-19 NOTE — H&P (Signed)
[facility] History and Physical  Doyel Talbot [id number] DOB: January 29, 1961 DOA: 06/16/2013  Referring physician:  PCP: Adrian Prows, MD  Specialists:   Chief Complaint: Homicidal ideation  HPI: Stanley [name] is a 53 y.o. male  With a history of end-stage renal disease, hypertension that presents to the emergency room with homicidal ideations on 06/16/2013. Patient was being held in the ER for placement to the psychiatric unit. Patient dialyzes on Tuesdays, Thursday, Saturday. Nephrology was consulted as patient required dialysis. At this time [facility] was asked to admit the patient until psychiatric bed became available. Patient was recently discharged on 06/06/2013 for a mandibular fracture. He does still complain of pain. At this time, patient has no other complaints, he denies any chest pain, dizziness, abdominal pain, shortness of breath. He denies being suicidal or homicidal at this time.  Review of Systems:  Constitutional: Denies fever, chills, diaphoresis, appetite change and fatigue.  HEENT: Complains of jaw pain. Respiratory: Denies SOB, DOE, cough, chest tightness,  and wheezing.   Cardiovascular: Denies chest pain, palpitations and leg swelling.  Gastrointestinal: Denies nausea, vomiting, abdominal pain, diarrhea, constipation, blood in stool and abdominal distention.  Genitourinary: Denies dysuria, urgency, frequency, hematuria, flank pain and difficulty urinating.  Musculoskeletal: Complains of jaw pain.  Neurological: Denies dizziness, seizures, syncope, weakness, light-headedness, numbness and headaches.  Hematological: Denies adenopathy. Easy bruising, personal or family bleeding history  Psychiatric/Behavioral: At this time patient denies any suicidal ideations or homicidal ideations.  Past Medical History  Diagnosis Date  . Coronary artery disease   . Hypertension   . Shortness of breath   . Hypothyroidism   . Chronic kidney disease   . GERD  (gastroesophageal reflux disease)    Past Surgical History  Procedure Laterality Date  . Coronary angioplasty    . Coronary artery bypass graft    . Dialysis fistula creation    . Orif mandibular fracture N/A 06/05/2013    Procedure: REPAIR OF MANDIBULAR FRACTURE x 2 with maxillo-mandibular fixation ;  Surgeon: Theodoro Kos, DO;  Location: [facility];  Service: Plastics;  Laterality: N/A;   Social History:  reports that he has been smoking Cigarettes.  He has a 80 pack-year smoking history. He has never used smokeless tobacco. He reports that he drinks about 12.6 ounces of alcohol per week. He reports that he uses illicit drugs (Marijuana) about 7 times per week.   No Known Allergies  History reviewed. No pertinent family history.  Prior to Admission medications   Medication Sig Start Date End Date Taking? Authorizing Provider  amLODipine (NORVASC) 5 MG tablet Take 5 mg by mouth daily.   Yes Historical Provider, MD  aspirin EC 81 MG tablet Take 81 mg by mouth daily.   Yes Historical Provider, MD  atorvastatin (LIPITOR) 40 MG tablet Take 40 mg by mouth daily.   Yes Historical Provider, MD  benazepril (LOTENSIN) 10 MG tablet Take 10 mg by mouth daily.   Yes Historical Provider, MD  calcium acetate (PHOSLO) 667 MG capsule Take 2,001 mg by mouth 3 (three) times daily with meals.   Yes Historical Provider, MD  gabapentin (NEURONTIN) 300 MG capsule Take 300 mg by mouth at bedtime.   Yes Historical Provider, MD  isosorbide mononitrate (IMDUR) 60 MG 24 hr tablet Take 60 mg by mouth daily.   Yes Historical Provider, MD  labetalol (NORMODYNE) 200 MG tablet Take 200 mg by mouth daily.   Yes Historical Provider, MD  Multiple Vitamins-Minerals (MULTIVITAMIN WITH MINERALS) tablet Take 1  tablet by mouth daily.   Yes Historical Provider, MD  omeprazole (PRILOSEC) 20 MG capsule Take 20 mg by mouth daily.   Yes Historical Provider, MD  oxyCODONE (OXY IR/ROXICODONE) 5 MG immediate release tablet Take 1 tablet  (5 mg total) by mouth every 4 (four) hours as needed for severe pain. 06/06/13  Yes Oswald Hillock, MD   Physical Exam: Filed Vitals:   06/19/13 0955  BP: 103/57  Pulse: 90  Temp:   Resp: 20     General: Well developed, well nourished, NAD, appears stated age  HEENT: NCAT, PERRLA, EOMI, Anicteic Sclera, mucous membranes moist.   Neck: Supple, no JVD, no masses  Cardiovascular: S1 S2 auscultated, no rubs, murmurs or gallops. Regular rate and rhythm.  Respiratory: Clear to auscultation bilaterally with equal chest rise  Abdomen: Soft, nontender, nondistended, + bowel sounds  Extremities: warm dry without cyanosis clubbing or edema.  RUE fistula with good thrill and bruit  Neuro: AAOx3, cranial nerves grossly intact. Strength 5/5 in patient's upper and lower extremities bilaterally  Skin: Without rashes exudates or nodules  Psych: Normal affect and demeanor  Labs on Admission:  Basic Metabolic Panel:  Recent Labs Lab 06/16/13 1325 06/16/13 2118 06/18/13 1641  NA 142 138 139  K 6.5* 4.6 4.6  CL 106 100 103  CO2 19 19 19   GLUCOSE 56* 96 125*  BUN 35* 29* 31*  CREATININE 7.49* 6.05* 6.87*  CALCIUM 9.3 9.1 9.0  PHOS  --   --  3.7   Liver Function Tests:  Recent Labs Lab 06/18/13 1641  ALBUMIN 3.1*   No results found for this basename: LIPASE, AMYLASE,  in the last 168 hours No results found for this basename: AMMONIA,  in the last 168 hours CBC:  Recent Labs Lab 06/16/13 1325 06/18/13 1641  WBC 10.2 11.3*  NEUTROABS 6.6  --   HGB 12.2* 12.7*  HCT 37.7* 36.8*  MCV 99.0 96.8  PLT 370 347   Cardiac Enzymes: No results found for this basename: CKTOTAL, CKMB, CKMBINDEX, TROPONINI,  in the last 168 hours  BNP (last 3 results) No results found for this basename: PROBNP,  in the last 8760 hours CBG:  Recent Labs Lab 06/16/13 1851 06/16/13 1922 06/17/13 0605 06/17/13 1628 06/19/13 1003  GLUCAP 26* 123* 78 119* 102*    Radiological Exams on  Admission: No results found.  EKG: Independently reviewed. 5 2015 shows sinus rhythm, rate 83  Assessment/Plan  Homicidal ideation Supposedly patient presented to the emergency department a few days ago with homicidal ideations. He is been held in the ER for approximately 2 days. Will consult psychiatry for evaluation. At this time patient states he is not homicidal nor does he wish to harm himself or anyone else.  Patient is currently on the waiting list at the psychiatric unit.  End-stage renal disease requiring dialysis Nephrology regarding been consulted and has been following the patient for 3 days. Patient dialyzes on Tuesday, Thursday, Saturday. Patient did undergo dialysis on Tuesday, 06/18/2013. Continue PhosLo and Renavit.  Mandibular fracture Patient was recently discharged on 06/06/2013 for a mandibular fracture. Will continue pain control.  Hyperlipidemia Continue atorvastatin  Hypertension Continue amlodipine  Tobacco abuse Continue NicoDerm patch  Leukocytosis Possibly reactive. Urinary analysis was negative for infection. Will continue to monitor CBC. Patient denies any cough meds currently afebrile.  Cocaine abuse Patient was counseled on the need to abstain from illicit drugs.  DVT prophylaxis: Lovenox  Code Status: Full  Condition: Stable  Family Communication: None at bedside. Admission, patients condition and plan of care including tests being ordered have been discussed with the patient,  who indicate understanding and agree with the plan and Code Status.  Disposition Plan: Admitted   Time spent: 70 minutes  [place] D.O. [facility] Pager [phone]  If 7PM-7AM, please contact night-coverage [email or web address] Password [email or web address] 06/19/2013, 10:40 AM

## 2013-06-19 NOTE — Progress Notes (Signed)
Unit CM UR Completed by [facility] ED CM  W. Scotlynn Noyes RN  

## 2013-06-19 NOTE — ED Notes (Signed)
Per dr Audie Pinto internal medicine to see pt for possible admit

## 2013-06-19 NOTE — Progress Notes (Addendum)
Late Entry Spoke with Dr Ree Kida about order for placing patient in appropriate status. Pt came in to ED 4/5 and was IVC'd for HI, with ESRD  and was  placed  on [facility] wait list. Patient is on HD.  Was made aware that patient was accepted to [facility] yesterday by Elberta Fortis Disposition Tech. Dr. Ree Kida placed pt  in observation status .  Contacted [place] regarding disposition she stated, Pt was assessed 4/6 and noted not to be a candidate for [facility]. Was informed by [facility] Counselor that patient is waiting to placed on the wait list once a dialysis center is secure near [facility].  Notified Dr Waymon Budge with this information. Psych eval ordered as per Dr. Ree Kida.

## 2013-06-19 NOTE — Progress Notes (Signed)
06/19/2013 6:13 PM Hemodialysis Outpatient Note; this patient has been tentatively accepted  at the [facility] ( [place] [id number])  center on a Monday,Wednesday and Friday schedule. This transfer was made per the social worker request stating that this patient was going to [facility] in [place]. Please call the dialysis unit  (ext [phone]) tomorrow 06/20/13 to verify actual acceptance and to receive chair time. Please give the hemodialysis department until noontime to confirm acceptance. Thank you. Gordy Savers

## 2013-06-19 NOTE — ED Notes (Signed)
meds ordered from pharmacy.

## 2013-06-19 NOTE — ED Provider Notes (Signed)
Medical screening examination/treatment/procedure(s) were performed by non-physician practitioner and as supervising physician I was immediately available for consultation/collaboration.   EKG Interpretation   Date/Time:  Sunday June 16 2013 14:53:20 EDT Ventricular Rate:  83 PR Interval:  189 QRS Duration: 113 QT Interval:  368 QTC Calculation: 432 R Axis:   84 Text Interpretation:  Sinus rhythm Borderline intraventricular conduction  delay Borderline T wave abnormalities Minimal ST elevation, inferior leads  no peaked T waves No old tracing to compare Confirmed by [facility]  MD,  CHARLES [id number]) on 06/16/2013 3:23:09 PM        Mercie Eon. Karle Starch, MD 06/19/13 [phone]

## 2013-06-20 ENCOUNTER — Observation Stay (HOSPITAL_COMMUNITY): Payer: Medicare Other

## 2013-06-20 LAB — BASIC METABOLIC PANEL
BUN: 39 mg/dL — ABNORMAL HIGH (ref 6–23)
CO2: 23 mEq/L (ref 19–32)
CREATININE: 9.11 mg/dL — AB (ref 0.50–1.35)
Calcium: 9.3 mg/dL (ref 8.4–10.5)
Chloride: 91 mEq/L — ABNORMAL LOW (ref 96–112)
GFR, EST AFRICAN AMERICAN: 7 mL/min — AB (ref >90)
GFR, EST NON AFRICAN AMERICAN: 6 mL/min — AB (ref >90)
GLUCOSE: 95 mg/dL (ref 70–99)
Potassium: 5.1 mEq/L (ref 3.7–5.3)
Sodium: 135 mEq/L — ABNORMAL LOW (ref 137–147)

## 2013-06-20 LAB — CBC
HCT: 39.2 % (ref 39.0–52.0)
HEMOGLOBIN: 12.8 g/dL — AB (ref 13.0–17.0)
MCH: 31.8 pg (ref 26.0–34.0)
MCHC: 32.7 g/dL (ref 30.0–36.0)
MCV: 97.3 fL (ref 78.0–100.0)
Platelets: 315 10*3/uL (ref 150–400)
RBC: 4.03 MIL/uL — ABNORMAL LOW (ref 4.22–5.81)
RDW: 14.9 % (ref 11.5–15.5)
WBC: 12.7 10*3/uL — ABNORMAL HIGH (ref 4.0–10.5)

## 2013-06-20 MED ORDER — SODIUM CHLORIDE 0.9 % IV SOLN
1.5000 g | Freq: Two times a day (BID) | INTRAVENOUS | Status: DC
  Administered 2013-06-21 (×2): 1.5 g via INTRAVENOUS
  Filled 2013-06-20 (×4): qty 1.5

## 2013-06-20 MED ORDER — SODIUM CHLORIDE 0.9 % IV BOLUS (SEPSIS)
250.0000 mL | Freq: Once | INTRAVENOUS | Status: AC
  Administered 2013-06-20: 250 mL via INTRAVENOUS
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
## 2013-06-20 NOTE — Progress Notes (Signed)
06/20/13 0800  SLP G-Codes **NOT FOR INPATIENT CLASS**  Functional Assessment Tool Used clinical judgement  Functional Limitations Swallowing  Swallow Current Status [id number]) CI  Swallow Goal Status [id number]) CI  Swallow Discharge Status [id number]) CI  SLP Evaluations  $ SLP Speech Visit 1 Procedure  SLP Evaluations  $BSS Swallow 1 Procedure

## 2013-06-20 NOTE — Progress Notes (Signed)
ED CM contacted [facility] concerning Psych eval spoke with Stanton Kidney, patient on list to be seen by Dr. Louretta Shorten today. Unit CM notified.

## 2013-06-20 NOTE — Progress Notes (Signed)
Patient ID: Stanley [name], male   DOB: [birth year], 53 y.o.   MRN: [id number] Patient is being dialyzed as inpatient status. That was agreement when was in ED, otherwise needs to go to his outpatient UNIT.

## 2013-06-20 NOTE — Progress Notes (Signed)
CSW received phone call from Marisa Cyphers reporting that pt has been accepted to [facility] [phone] can start as early as Tuesday. CSW shared this information with [facility] and they are to follow up [facility].    [address] [place]

## 2013-06-20 NOTE — Progress Notes (Signed)
Subjective: Interval History: has no complaint of not much appetite.  Objective: Vital signs in last 24 hours: Temp:  [97.4 F (36.3 C)-98 F (36.7 C)] 97.9 F (36.6 C) (04/09 0900) Pulse Rate:  [74-94] 94 (04/09 0900) Resp:  [16-20] 16 (04/09 0500) BP: (75-101)/(49-76) 81/49 mmHg (04/09 0900) SpO2:  [98 %-100 %] 99 % (04/09 0900) Weight change: 1 kg (2 lb 3.3 oz)  Intake/Output from previous day: 04/08 0701 - 04/09 0700 In: 240 [P.O.:240] Out: -  Intake/Output this shift: Total I/O In: 240 [P.O.:240] Out: -   General appearance: alert, cooperative and no distress Resp: clear to auscultation bilaterally Cardio: S1, S2 normal and systolic murmur: holosystolic 2/6, blowing at apex GI: pos bs, liver down 4 cm Extremities: AVF L arm B&T  Lab Results:  Recent Labs  06/18/13 1641 06/19/13 1200  WBC 11.3* 14.8*  HGB 12.7* 14.4  HCT 36.8* 42.1  PLT 347 370   BMET:  Recent Labs  06/18/13 1641 06/19/13 1200  NA 139  --   K 4.6  --   CL 103  --   CO2 19  --   GLUCOSE 125*  --   BUN 31*  --   CREATININE 6.87* 6.72*  CALCIUM 9.0  --    No results found for this basename: PTH,  in the last 72 hours Iron Studies: No results found for this basename: IRON, TIBC, TRANSFERRIN, FERRITIN,  in the last 72 hours  Studies/Results: Dg Chest Port 1 View  06/20/2013   CLINICAL DATA:  53 year old male with shortness of Breath. Initial encounter.  EXAM: PORTABLE CHEST - 1 VIEW  COMPARISON:  06/02/2013 and earlier.  FINDINGS: Portable AP semi upright view at 0811 hrs. Improved lung volumes. Normal cardiac size and mediastinal contours. Sequelae of CABG. Visualized tracheal air column is within normal limits. No pneumothorax, pulmonary edema, pleural effusion or confluent pulmonary opacity.  IMPRESSION: No acute cardiopulmonary abnormality.   Electronically Signed   By: Lars Pinks M.D.   On: 06/20/2013 07:26    I have reviewed the patient's current medications.  Assessment/Plan: 1  CRF for HD 2 HTN controlled ,low  3 Anemia not an issue 4 suicidal/homicidal P HD, commitment    LOS: 4 days   Shalayne Leach L Bettie Swavely 06/20/2013,11:45 AM

## 2013-06-20 NOTE — Progress Notes (Signed)
Late Entry   Stanley [name] mad aware of patient's vitals   06/19/13 2100  Vitals  Temp 97.4 F (36.3 C)  Temp src Oral  BP ! 75/49 mmHg  BP Location Left arm  BP Method Automatic  Patient Position, if appropriate Lying  Pulse Rate 82  Pulse Rate Source Dinamap  Resp 18  Oxygen Therapy  SpO2 100 %  O2 Device None (Room air)  Pt  Asymptomatic  And Baltazar Najjar made aware order to recheck BP and BP was 79/58 . Recheck BP at 2300 pt asymptomatic at 80/56. Baltazar Najjar made aware no orders received will continue to monitor

## 2013-06-20 NOTE — Procedures (Signed)
I was present at this session.  I have reviewed the session itself and made appropriate changes.  HD via L arm AVF.  bp borderline.low  Joyice Faster Nielle Duford 4/9/20151:29 PM
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
## 2013-06-20 NOTE — Evaluation (Signed)
Clinical/Bedside Swallow Evaluation Patient Details  Name: Davontae Ruston MRN: [id number] Date of Birth: 12-10-1960  Today's Date: 06/20/2013 Time: 0825-0840 SLP Time Calculation (min): 15 min  Past Medical History:  Past Medical History  Diagnosis Date  . Coronary artery disease   . Hypertension   . Shortness of breath   . Hypothyroidism   . Chronic kidney disease   . GERD (gastroesophageal reflux disease)    Past Surgical History:  Past Surgical History  Procedure Laterality Date  . Coronary angioplasty    . Coronary artery bypass graft    . Dialysis fistula creation    . Orif mandibular fracture N/A 06/05/2013    Procedure: REPAIR OF MANDIBULAR FRACTURE x 2 with maxillo-mandibular fixation ;  Surgeon: Theodoro Kos, DO;  Location: [facility];  Service: Plastics;  Laterality: N/A;   HPI:  Patick Langell is a 53 y.o. male with a history of end-stage renal disease, hypertension that presents to the emergency room with homicidal ideations on 06/16/2013. Patient was being held in the ER for placement to the psychiatric unit. Patient dialyzes on Tuesdays, Thursday, Saturday. Nephrology was consulted as patient required dialysis. At this time [facility] was asked to admit the patient until psychiatric bed became available. Patient was recently discharged on 06/06/2013 for a mandibular fracture. He does still complain of pain. At this time, patient has no other complaints, he denies any chest pain, dizziness, abdominal pain, shortness of breath.    Assessment / Plan / Recommendation Clinical Impression  Pt demonstrates impaired oral function due to recent mandibular fracture and fixation. Pt removed the bands placed for fixation stating "I couldnt drink broth." He has reduced ROM of mandible and reduced strength for biting and masticating solids. He tolerates soft solids well, consumed regular eggs without difficulty. No evidence of aspiration with thin liquids. SLP will downgrade diet to Dys 2 (ground)  with thin liquids. Pt can determine readiness for texture upgrade. No SLP f/u needed.     Aspiration Risk  Mild    Diet Recommendation Dysphagia 2 (Fine chop);Thin liquid   Liquid Administration via: Cup;Straw Medication Administration: Whole meds with liquid Supervision: Patient able to self feed Compensations: Slow rate;Small sips/bites Postural Changes and/or Swallow Maneuvers: Seated upright 90 degrees    Other  Recommendations Oral Care Recommendations: Oral care BID   Follow Up Recommendations  None    Frequency and Duration        Pertinent Vitals/Pain NA    SLP Swallow Goals     Swallow Study Prior Functional Status       General HPI: Kyere Galati is a 53 y.o. male with a history of end-stage renal disease, hypertension that presents to the emergency room with homicidal ideations on 06/16/2013. Patient was being held in the ER for placement to the psychiatric unit. Patient dialyzes on Tuesdays, Thursday, Saturday. Nephrology was consulted as patient required dialysis. At this time [facility] was asked to admit the patient until psychiatric bed became available. Patient was recently discharged on 06/06/2013 for a mandibular fracture. He does still complain of pain. At this time, patient has no other complaints, he denies any chest pain, dizziness, abdominal pain, shortness of breath.  Type of Study: Bedside swallow evaluation Previous Swallow Assessment: none Diet Prior to this Study: Regular;Thin liquids Temperature Spikes Noted: No Respiratory Status: Room air History of Recent Intubation: No Behavior/Cognition: Alert;Cooperative;Pleasant mood Oral Cavity - Dentition: Missing dentition Self-Feeding Abilities: Able to feed self Patient Positioning: Upright in bed Baseline Vocal Quality: Clear  Volitional Cough: Strong Volitional Swallow: Able to elicit    Oral/Motor/Sensory Function Overall Oral Motor/Sensory Function: Impaired Labial ROM: Within Functional  Limits Labial Symmetry: Within Functional Limits Labial Strength: Within Functional Limits Labial Sensation: Within Functional Limits Lingual ROM: Within Functional Limits Lingual Symmetry: Within Functional Limits Lingual Strength: Within Functional Limits Lingual Sensation: Within Functional Limits Facial ROM: Within Functional Limits Facial Symmetry: Within Functional Limits Facial Strength: Within Functional Limits Facial Sensation: Within Functional Limits Velum: Within Functional Limits Mandible: Impaired (decreased mandibular movement d/t ORIF, pt removed brace)   Ice Chips     Thin Liquid Thin Liquid: Within functional limits Presentation: Cup;Straw;Self Fed    Nectar Thick Nectar Thick Liquid: Not tested   Honey Thick Honey Thick Liquid: Not tested   Puree Puree: Within functional limits   Solid   GO    Solid: Not tested (pt unable to masticate cracker or bagel on try, ok with eggs)      Herbie Baltimore, MA CCC-SLP [phone]  Katherene Ponto Adyan Palau 06/20/2013,8:47 AM

## 2013-06-20 NOTE — Progress Notes (Signed)
CSW received call from [facility] regarding [phone] regarding a dialysis facility in the [place] area. Pt.'s information was sent to [facility] [phone] we are awaiting a decision to determine if the facility would accept.   [address] [place]

## 2013-06-21 MED ORDER — DENTAL WAX WAX
1.0000 "application " | Status: DC | PRN
  Filled 2013-06-21: qty 1

## 2013-06-21 MED ORDER — DENTAL WAX WAX: Status: DC

## 2013-06-21 MED ORDER — AMOXICILLIN-POT CLAVULANATE 500-125 MG PO TABS: 1.0000 | ORAL_TABLET | Freq: Two times a day (BID) | ORAL | Status: DC

## 2013-06-21 MED ORDER — CARBAMIDE PEROXIDE 10 % MT SOLN
Freq: Four times a day (QID) | OROMUCOSAL | Status: DC
  Administered 2013-06-21: 11:00:00 via DENTAL
  Filled 2013-06-21 (×36): qty 15

## 2013-06-21 MED ORDER — CARBAMIDE PEROXIDE 10 % MT SOLN
Freq: Four times a day (QID) | OROMUCOSAL | Status: DC
  Filled 2013-06-21: qty 60

## 2013-06-21 NOTE — Clinical Social Work Note (Signed)
Psych CSW received report from ED CSW regarding pt being on the [facility] waitlist and dialysis set up in [place].  Psychiatry has evaluated the patient (06/20/2013) and recommended Sales executive.  Psychiatrist states that pt DID NOT meet criteria for IP tx and believed his HI to be cocaine related rumination.  Psych CSW updated RNCM and is signing off, but remains available for assistance as needed.  Nonnie Done, [place] [phone]  Clinical Social Work

## 2013-06-21 NOTE — Progress Notes (Signed)
  Subjective: Patient status post ORIF of mandible fractures with MMF. Pt cut wires and the lower arch bar for the MMF. There is no way to use rubber bands on the arch bar as the lower bar is unstable.  Patient currently has sitter in place.  The is no drainage from the surgical sites at this point. No erythema. He is very tender with exam, but cooperative for the most part.   Objective: Vital signs in last 24 hours: Temp:  [97.5 F (36.4 C)-98.7 F (37.1 C)] 97.9 F (36.6 C) (04/10 0537) Pulse Rate:  [66-94] 86 (04/10 0537) Resp:  [18-20] 18 (04/10 0537) BP: (81-124)/(49-77) 89/58 mmHg (04/10 0537) SpO2:  [97 %-100 %] 97 % (04/10 0537) Weight:  [78.9 kg (173 lb 15.1 oz)-80.6 kg (177 lb 11.1 oz)] 80.6 kg (177 lb 11.1 oz) (04/09 2056) Last BM Date: 06/18/13  Intake/Output from previous day: 04/09 0701 - 04/10 0700 In: 600 [P.O.:600] Out: 0  Intake/Output this shift:    Lower arch bar is unstable and in several pieces, but still secured with screws  Lab Results:   Recent Labs  06/19/13 1200 06/20/13 1151  WBC 14.8* 12.7*  HGB 14.4 12.8*  HCT 42.1 39.2  PLT 370 315   BMET  Recent Labs  06/18/13 1641 06/19/13 1200 06/20/13 1151  NA 139  --  135*  K 4.6  --  5.1  CL 103  --  91*  CO2 19  --  23  GLUCOSE 125*  --  95  BUN 31*  --  39*  CREATININE 6.87* 6.72* 9.11*  CALCIUM 9.0  --  9.3   PT/INR No results found for this basename: LABPROT, INR,  in the last 72 hours ABG No results found for this basename: PHART, PCO2, PO2, HCO3,  in the last 72 hours  Studies/Results: Dg Chest Port 1 View  06/20/2013   CLINICAL DATA:  53 year old male with shortness of Breath. Initial encounter.  EXAM: PORTABLE CHEST - 1 VIEW  COMPARISON:  06/02/2013 and earlier.  FINDINGS: Portable AP semi upright view at 0811 hrs. Improved lung volumes. Normal cardiac size and mediastinal contours. Sequelae of CABG. Visualized tracheal air column is within normal limits. No pneumothorax,  pulmonary edema, pleural effusion or confluent pulmonary opacity.  IMPRESSION: No acute cardiopulmonary abnormality.   Electronically Signed   By: Lars Pinks M.D.   On: 06/20/2013 07:26    Anti-infectives: Anti-infectives   Start     Dose/Rate Route Frequency Ordered Stop   06/20/13 1130  ampicillin-sulbactam (UNASYN) 1.5 g in sodium chloride 0.9 % 50 mL IVPB     1.5 g 100 mL/hr over 30 Minutes Intravenous Every 12 hours 06/20/13 1129        Assessment/Plan: s/p ORIF of mandible fractures with MMF 05/2513- POD # 15  Agree with IV Unasyn for coverage for now and would discharge on po Augmentin Will need to remove arch bars in several weeks Use dental wax to protect gums for now.  Will need follow up in office in 2 weeks with Dr. Migdalia Dk [id number]  LOS: 5 days    [facility] Plastic Surgery [phone]

## 2013-06-21 NOTE — Care Management Note (Signed)
   CARE MANAGEMENT NOTE 06/21/2013  Patient:  Stanley [name],Stanley [name]   Account Number:  [id number]  Date Initiated:  06/19/2013  Documentation initiated by:  Erine Phenix  Subjective/Objective Assessment:     Action/Plan:   CM to follow for progression and d/c planning.  06/21/2013 Met with pt and with his permission called his sister Stanley [name] who will assist pt in obtaining dental wax etc.   Anticipated DC Date:  06/21/2013   Anticipated DC Plan:  HOME/SELF CARE         Choice offered to / List presented to:             Status of service:  Completed, signed off Medicare Important Message given?   (If response is "NO", the following Medicare IM given date fields will be blank) Date Medicare IM given:   Date Additional Medicare IM given:    Discharge Disposition:  HOME/SELF CARE  Per UR Regulation:    If discussed at Long Length of Stay Meetings, dates discussed:    Comments:  06/21/2013 Met with pt and called his sister Stanley [name] with his permission re his d/c. She assist with his meds and groceries. She states that his other sister will pick him up from the hospital and that they will assist pt in getting supplies as needed. Jasmine Pang RN MPH, case manager, [phone]

## 2013-06-21 NOTE — Discharge Instructions (Signed)
Follow with Primary MD Ola Spurr, DAVID, MD and Dr Migdalia Dk  in 7 days   Get CBC, CMP, checked 7 days by Primary MD and again as instructed by your Primary MD.     Activity: As tolerated with Full fall precautions use walker/cane & assistance as needed   Disposition Home     Diet: Renal - soft diet . Check your Weight same time everyday, if you gain over 2 pounds, or you develop in leg swelling, experience more shortness of breath or chest pain, call your Primary MD immediately. Follow Cardiac Low Salt Diet and 1.5 lit/day fluid restriction.   On your next visit with her primary care physician please Get Medicines reviewed and adjusted.  Please request your Prim.MD to go over all Hospital Tests and Procedure/Radiological results at the follow up, please get all Hospital records sent to your Prim MD by signing hospital release before you go home.   If you experience worsening of your admission symptoms, develop shortness of breath, life threatening emergency, suicidal or homicidal thoughts you must seek medical attention immediately by calling 911 or calling your MD immediately  if symptoms less severe.  You Must read complete instructions/literature along with all the possible adverse reactions/side effects for all the Medicines you take and that have been prescribed to you. Take any new Medicines after you have completely understood and accpet all the possible adverse reactions/side effects.   Do not drive and provide baby sitting services if your were admitted for syncope or siezures until you have seen by Primary MD or a Neurologist and advised to do so again.  Do not drive when taking Pain medications.    Do not take more than prescribed Pain, Sleep and Anxiety Medications  Special Instructions: If you have smoked or chewed Tobacco  in the last 2 yrs please stop smoking, stop any regular Alcohol  and or any Recreational drug use.  Wear Seat belts while driving.   Please  note  You were cared for by a hospitalist during your hospital stay. If you have any questions about your discharge medications or the care you received while you were in the hospital after you are discharged, you can call the unit and asked to speak with the hospitalist on call if the hospitalist that took care of you is not available. Once you are discharged, your primary care physician will handle any further medical issues. Please note that NO REFILLS for any discharge medications will be authorized once you are discharged, as it is imperative that you return to your primary care physician (or establish a relationship with a primary care physician if you do not have one) for your aftercare needs so that they can reassess your need for medications and monitor your lab values.
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
## 2013-06-21 NOTE — Progress Notes (Signed)
Patient was discharged home with family. Patient was given discharge instructions and prescriptions. Appointments were set up for patient prior to discharge. Patient was given education on care for mandible fracture. Patient was told to contact the doctor with questions and concerns. Patient was discharged with belongings. Patient was stable upon discharge.

## 2013-06-21 NOTE — Progress Notes (Signed)
Agree with the above plan 

## 2013-06-21 NOTE — Progress Notes (Signed)
Subjective: Interval History: has complaints sore in mouth.  Objective: Vital signs in last 24 hours: Temp:  [97.5 F (36.4 C)-98.7 F (37.1 C)] 97.9 F (36.6 C) (04/10 0537) Pulse Rate:  [66-92] 86 (04/10 0537) Resp:  [18-20] 18 (04/10 0537) BP: (85-124)/(57-77) 89/58 mmHg (04/10 0537) SpO2:  [97 %-100 %] 97 % (04/10 0537) Weight:  [78.9 kg (173 lb 15.1 oz)-80.6 kg (177 lb 11.1 oz)] 80.6 kg (177 lb 11.1 oz) (04/09 2056) Weight change: 1.2 kg (2 lb 10.3 oz)  Intake/Output from previous day: 04/09 0701 - 04/10 0700 In: 600 [P.O.:600] Out: 0  Intake/Output this shift: Total I/O In: 240 [P.O.:240] Out: -   General appearance: alert and cooperative Resp: diminished breath sounds bilaterally Cardio: S1, S2 normal and systolic murmur: holosystolic 2/6, blowing at apex GI: pos bs, liver down 4 cm Extremities: AVF L arm  Lab Results:  Recent Labs  06/19/13 1200 06/20/13 1151  WBC 14.8* 12.7*  HGB 14.4 12.8*  HCT 42.1 39.2  PLT 370 315   BMET:  Recent Labs  06/18/13 1641 06/19/13 1200 06/20/13 1151  NA 139  --  135*  K 4.6  --  5.1  CL 103  --  91*  CO2 19  --  23  GLUCOSE 125*  --  95  BUN 31*  --  39*  CREATININE 6.87* 6.72* 9.11*  CALCIUM 9.0  --  9.3   No results found for this basename: PTH,  in the last 72 hours Iron Studies: No results found for this basename: IRON, TIBC, TRANSFERRIN, FERRITIN,  in the last 72 hours  Studies/Results: Dg Chest Port 1 View  06/20/2013   CLINICAL DATA:  53 year old male with shortness of Breath. Initial encounter.  EXAM: PORTABLE CHEST - 1 VIEW  COMPARISON:  06/02/2013 and earlier.  FINDINGS: Portable AP semi upright view at 0811 hrs. Improved lung volumes. Normal cardiac size and mediastinal contours. Sequelae of CABG. Visualized tracheal air column is within normal limits. No pneumothorax, pulmonary edema, pleural effusion or confluent pulmonary opacity.  IMPRESSION: No acute cardiopulmonary abnormality.   Electronically  Signed   By: Lars Pinks M.D.   On: 06/20/2013 07:26    I have reviewed the patient's current medications.  Assessment/Plan: 1 CRF s/o Hd yest.  Counseled. Will cont inpatient HD as long as is here. 2 suicidal/homicidal behavior 3 CAD 4 HPTH P HD inpatient to cont    LOS: 5 days   Krystel Fletchall L Corin Tilly 06/21/2013,10:15 AM

## 2013-07-02 ENCOUNTER — Ambulatory Visit (HOSPITAL_COMMUNITY)
Admission: RE | Admit: 2013-07-02 | Discharge: 2013-07-02 | Disposition: A | Discharge status: Home / Self Care | Payer: Medicare Other | Source: Ambulatory Visit | Attending: Plastic Surgery | Admitting: Plastic Surgery

## 2013-07-02 ENCOUNTER — Other Ambulatory Visit (HOSPITAL_COMMUNITY): Payer: Self-pay | Admitting: Plastic Surgery

## 2013-07-02 DIAGNOSIS — X58XXXA Exposure to other specified factors, initial encounter: Secondary | ICD-10-CM | POA: Insufficient documentation

## 2013-07-02 DIAGNOSIS — S02650A Fracture of angle of mandible, unspecified side, initial encounter for closed fracture: Secondary | ICD-10-CM | POA: Insufficient documentation

## 2013-07-02 DIAGNOSIS — T148XXA Other injury of unspecified body region, initial encounter: Secondary | ICD-10-CM

## 2013-07-09 ENCOUNTER — Emergency Department: Payer: Self-pay | Admitting: Emergency Medicine

## 2013-07-09 LAB — COMPREHENSIVE METABOLIC PANEL
ALK PHOS: 104 U/L
Albumin: 3.2 g/dL — ABNORMAL LOW (ref 3.4–5.0)
Anion Gap: 9 (ref 7–16)
BILIRUBIN TOTAL: 0.3 mg/dL (ref 0.2–1.0)
BUN: 36 mg/dL — ABNORMAL HIGH (ref 7–18)
Calcium, Total: 8.8 mg/dL (ref 8.5–10.1)
Chloride: 109 mmol/L — ABNORMAL HIGH (ref 98–107)
Co2: 20 mmol/L — ABNORMAL LOW (ref 21–32)
Creatinine: 6.99 mg/dL — ABNORMAL HIGH (ref 0.60–1.30)
EGFR (African American): 9 — ABNORMAL LOW
GFR CALC NON AF AMER: 8 — AB
Glucose: 112 mg/dL — ABNORMAL HIGH (ref 65–99)
Osmolality: 285 (ref 275–301)
Potassium: 4.6 mmol/L (ref 3.5–5.1)
SGOT(AST): 10 U/L — ABNORMAL LOW (ref 15–37)
SGPT (ALT): 14 U/L (ref 12–78)
Sodium: 138 mmol/L (ref 136–145)
Total Protein: 7.2 g/dL (ref 6.4–8.2)

## 2013-07-09 LAB — CBC
HCT: 35.5 % — AB (ref 40.0–52.0)
HGB: 11.7 g/dL — ABNORMAL LOW (ref 13.0–18.0)
MCH: 32.8 pg (ref 26.0–34.0)
MCHC: 33 g/dL (ref 32.0–36.0)
MCV: 99 fL (ref 80–100)
Platelet: 194 10*3/uL (ref 150–440)
RBC: 3.57 10*6/uL — ABNORMAL LOW (ref 4.40–5.90)
RDW: 16.7 % — AB (ref 11.5–14.5)
WBC: 7.1 10*3/uL (ref 3.8–10.6)

## 2013-07-09 LAB — LIPASE, BLOOD: Lipase: 195 U/L (ref 73–393)

## 2013-07-24 ENCOUNTER — Encounter (HOSPITAL_BASED_OUTPATIENT_CLINIC_OR_DEPARTMENT_OTHER): Payer: Self-pay | Admitting: *Deleted

## 2013-07-25 ENCOUNTER — Encounter (HOSPITAL_BASED_OUTPATIENT_CLINIC_OR_DEPARTMENT_OTHER): Payer: Self-pay | Admitting: *Deleted
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
## 2013-07-29 ENCOUNTER — Ambulatory Visit (HOSPITAL_BASED_OUTPATIENT_CLINIC_OR_DEPARTMENT_OTHER): Payer: Medicare Other | Admitting: Anesthesiology

## 2013-07-29 ENCOUNTER — Ambulatory Visit (HOSPITAL_BASED_OUTPATIENT_CLINIC_OR_DEPARTMENT_OTHER): Admission: RE | Admit: 2013-07-29 | Payer: Medicare Other | Source: Ambulatory Visit | Admitting: Plastic Surgery

## 2013-07-29 ENCOUNTER — Encounter (HOSPITAL_BASED_OUTPATIENT_CLINIC_OR_DEPARTMENT_OTHER): Payer: Medicare Other | Admitting: Anesthesiology

## 2013-07-29 ENCOUNTER — Other Ambulatory Visit: Payer: Self-pay | Admitting: Plastic Surgery

## 2013-07-29 ENCOUNTER — Encounter (HOSPITAL_BASED_OUTPATIENT_CLINIC_OR_DEPARTMENT_OTHER): Admission: RE | Payer: Self-pay | Source: Ambulatory Visit

## 2013-07-29 ENCOUNTER — Encounter (HOSPITAL_BASED_OUTPATIENT_CLINIC_OR_DEPARTMENT_OTHER): Payer: Self-pay | Admitting: Plastic Surgery

## 2013-07-29 ENCOUNTER — Encounter (HOSPITAL_BASED_OUTPATIENT_CLINIC_OR_DEPARTMENT_OTHER)
Admission: RE | Disposition: A | Discharge status: Home / Self Care | Payer: Self-pay | Source: Ambulatory Visit | Attending: Plastic Surgery

## 2013-07-29 ENCOUNTER — Ambulatory Visit (HOSPITAL_BASED_OUTPATIENT_CLINIC_OR_DEPARTMENT_OTHER)
Admission: RE | Admit: 2013-07-29 | Discharge: 2013-07-29 | Disposition: A | Discharge status: Home / Self Care | Payer: Medicare Other | Source: Ambulatory Visit | Attending: Plastic Surgery | Admitting: Plastic Surgery

## 2013-07-29 DIAGNOSIS — F1410 Cocaine abuse, uncomplicated: Secondary | ICD-10-CM | POA: Insufficient documentation

## 2013-07-29 DIAGNOSIS — I120 Hypertensive chronic kidney disease with stage 5 chronic kidney disease or end stage renal disease: Secondary | ICD-10-CM | POA: Insufficient documentation

## 2013-07-29 DIAGNOSIS — Z992 Dependence on renal dialysis: Secondary | ICD-10-CM | POA: Insufficient documentation

## 2013-07-29 DIAGNOSIS — N186 End stage renal disease: Secondary | ICD-10-CM | POA: Insufficient documentation

## 2013-07-29 DIAGNOSIS — S02609B Fracture of mandible, unspecified, initial encounter for open fracture: Secondary | ICD-10-CM

## 2013-07-29 DIAGNOSIS — E1142 Type 2 diabetes mellitus with diabetic polyneuropathy: Secondary | ICD-10-CM | POA: Insufficient documentation

## 2013-07-29 DIAGNOSIS — I2510 Atherosclerotic heart disease of native coronary artery without angina pectoris: Secondary | ICD-10-CM | POA: Insufficient documentation

## 2013-07-29 DIAGNOSIS — F1010 Alcohol abuse, uncomplicated: Secondary | ICD-10-CM | POA: Insufficient documentation

## 2013-07-29 DIAGNOSIS — Z951 Presence of aortocoronary bypass graft: Secondary | ICD-10-CM | POA: Insufficient documentation

## 2013-07-29 DIAGNOSIS — E1149 Type 2 diabetes mellitus with other diabetic neurological complication: Secondary | ICD-10-CM | POA: Insufficient documentation

## 2013-07-29 DIAGNOSIS — F17200 Nicotine dependence, unspecified, uncomplicated: Secondary | ICD-10-CM | POA: Insufficient documentation

## 2013-07-29 DIAGNOSIS — K219 Gastro-esophageal reflux disease without esophagitis: Secondary | ICD-10-CM | POA: Insufficient documentation

## 2013-07-29 DIAGNOSIS — Z472 Encounter for removal of internal fixation device: Secondary | ICD-10-CM | POA: Insufficient documentation

## 2013-07-29 HISTORY — DX: Suicidal ideations: R45.851

## 2013-07-29 HISTORY — PX: MANDIBULAR HARDWARE REMOVAL: SHX5205

## 2013-07-29 HISTORY — DX: Dependence on renal dialysis: Z99.2

## 2013-07-29 HISTORY — DX: Alcohol abuse, uncomplicated: F10.10

## 2013-07-29 HISTORY — DX: Other psychoactive substance abuse, uncomplicated: F19.10

## 2013-07-29 HISTORY — DX: End stage renal disease: N18.6

## 2013-07-29 LAB — POCT I-STAT, CHEM 8
BUN: 32 mg/dL — ABNORMAL HIGH (ref 6–23)
Calcium, Ion: 1.25 mmol/L — ABNORMAL HIGH (ref 1.12–1.23)
Chloride: 108 mEq/L (ref 96–112)
Creatinine, Ser: 7.9 mg/dL — ABNORMAL HIGH (ref 0.50–1.35)
Glucose, Bld: 76 mg/dL (ref 70–99)
HCT: 42 % (ref 39.0–52.0)
Hemoglobin: 14.3 g/dL (ref 13.0–17.0)
Potassium: 6.1 mEq/L — ABNORMAL HIGH (ref 3.7–5.3)
Sodium: 139 mEq/L (ref 137–147)
TCO2: 20 mmol/L (ref 0–100)

## 2013-07-29 SURGERY — REMOVAL, HARDWARE, MANDIBLE
Anesthesia: General | Site: Mouth

## 2013-07-29 SURGERY — REMOVAL, HARDWARE, MANDIBLE
Anesthesia: General

## 2013-07-29 MED ORDER — CEFAZOLIN SODIUM-DEXTROSE 2-3 GM-% IV SOLR
2.0000 g | INTRAVENOUS | Status: AC
  Administered 2013-07-29: 2 g via INTRAVENOUS
  Filled 2013-07-29: qty 50

## 2013-07-29 MED ORDER — ONDANSETRON HCL 4 MG/2ML IJ SOLN
INTRAMUSCULAR | Status: DC | PRN
  Administered 2013-07-29: 4 mg via INTRAVENOUS

## 2013-07-29 MED ORDER — PROPOFOL 10 MG/ML IV BOLUS
INTRAVENOUS | Status: DC | PRN
  Administered 2013-07-29 (×3): 10 mg via INTRAVENOUS

## 2013-07-29 MED ORDER — MIDAZOLAM HCL 2 MG/2ML IJ SOLN
INTRAMUSCULAR | Status: AC
  Filled 2013-07-29: qty 4

## 2013-07-29 MED ORDER — KETOROLAC TROMETHAMINE 30 MG/ML IJ SOLN
INTRAMUSCULAR | Status: DC | PRN
  Administered 2013-07-29: 30 mg via INTRAVENOUS

## 2013-07-29 MED ORDER — FENTANYL CITRATE 0.05 MG/ML IJ SOLN
INTRAMUSCULAR | Status: AC
  Filled 2013-07-29: qty 4

## 2013-07-29 MED ORDER — MIDAZOLAM HCL 5 MG/5ML IJ SOLN
INTRAMUSCULAR | Status: DC | PRN
  Administered 2013-07-29: 2 mg via INTRAVENOUS
  Administered 2013-07-29 (×4): 1 mg via INTRAVENOUS

## 2013-07-29 MED ORDER — LIDOCAINE HCL (CARDIAC) 20 MG/ML IV SOLN
INTRAVENOUS | Status: DC | PRN
  Administered 2013-07-29: 60 mg via INTRAVENOUS

## 2013-07-29 MED ORDER — LACTATED RINGERS IV SOLN
INTRAVENOUS | Status: DC | PRN
Start: 2013-07-29 — End: 2013-07-29
  Administered 2013-07-29: 11:00:00 via INTRAVENOUS

## 2013-07-29 MED ORDER — SODIUM CHLORIDE 0.9 % IV SOLN
INTRAVENOUS | Status: DC
  Administered 2013-07-29: 11:00:00 via INTRAVENOUS
  Filled 2013-07-29: qty 1000

## 2013-07-29 MED ORDER — FENTANYL CITRATE 0.05 MG/ML IJ SOLN
INTRAMUSCULAR | Status: DC | PRN
  Administered 2013-07-29: 25 ug via INTRAVENOUS

## 2013-07-29 MED ORDER — MIDAZOLAM HCL 2 MG/2ML IJ SOLN
INTRAMUSCULAR | Status: AC
  Filled 2013-07-29: qty 2

## 2013-07-29 SURGICAL SUPPLY — 41 items
CANISTER SUCT 1200ML W/VALVE (MISCELLANEOUS) IMPLANT
CLEANER CAUTERY TIP 5X5 PAD (MISCELLANEOUS) IMPLANT
DECANTER SPIKE VIAL GLASS SM (MISCELLANEOUS) ×3 IMPLANT
DEPRESSOR TONGUE BLADE STERILE (MISCELLANEOUS) IMPLANT
ELECT COATED BLADE 2.86 ST (ELECTRODE) IMPLANT
ELECT NEEDLE BLADE 2-5/6 (NEEDLE) ×3 IMPLANT
ELECT REM PT RETURN 9FT ADLT (ELECTROSURGICAL) ×3
ELECTRODE REM PT RTRN 9FT ADLT (ELECTROSURGICAL) ×1 IMPLANT
GAUZE PACKING FOLDED 2  STR (GAUZE/BANDAGES/DRESSINGS)
GAUZE PACKING FOLDED 2 STR (GAUZE/BANDAGES/DRESSINGS) IMPLANT
GAUZE VASELINE FOILPK 1/2 X 72 (GAUZE/BANDAGES/DRESSINGS) IMPLANT
GLOVE BIO SURGEON STRL SZ 6.5 (GLOVE) ×2 IMPLANT
GLOVE BIO SURGEONS STRL SZ 6.5 (GLOVE) ×1
GLOVE INDICATOR 7.5 STRL GRN (GLOVE) ×3 IMPLANT
GLOVE SURG SS PI 7.5 STRL IVOR (GLOVE) ×3 IMPLANT
GOWN STRL REUS W/ TWL LRG LVL3 (GOWN DISPOSABLE) ×3 IMPLANT
GOWN STRL REUS W/TWL LRG LVL3 (GOWN DISPOSABLE) ×6
NEEDLE 27GAX1X1/2 (NEEDLE) ×3 IMPLANT
NEEDLE BLUNT 17GA (NEEDLE) IMPLANT
NS IRRIG 1000ML POUR BTL (IV SOLUTION) ×3 IMPLANT
PACK BASIN DAY SURGERY FS (CUSTOM PROCEDURE TRAY) ×3 IMPLANT
PACK ENT DAY SURGERY (CUSTOM PROCEDURE TRAY) ×3 IMPLANT
PAD CLEANER CAUTERY TIP 5X5 (MISCELLANEOUS)
PATTIES SURGICAL .5 X3 (DISPOSABLE) IMPLANT
PENCIL BUTTON HOLSTER BLD 10FT (ELECTRODE) IMPLANT
SCISSORS WIRE ANG 4 3/4 DISP (INSTRUMENTS) IMPLANT
SHEILD EYE MED CORNL SHD 22X21 (OPHTHALMIC RELATED)
SHIELD EYE MED CORNL SHD 22X21 (OPHTHALMIC RELATED) IMPLANT
SLEEVE SCD COMPRESS KNEE MED (MISCELLANEOUS) IMPLANT
SUT STEEL 0 (SUTURE)
SUT STEEL 0 18XMFL TIE 17 (SUTURE) IMPLANT
SUT STEEL 1 (SUTURE) IMPLANT
SUT STEEL 2 (SUTURE) IMPLANT
SUT VIC AB 3-0 FS2 27 (SUTURE) IMPLANT
SUT VICRYL 4-0 PS2 18IN ABS (SUTURE) ×3 IMPLANT
SYR BULB 3OZ (MISCELLANEOUS) IMPLANT
TOOTHBRUSH ADULT (PERSONAL CARE ITEMS) ×3 IMPLANT
TOWEL OR 17X24 6PK STRL BLUE (TOWEL DISPOSABLE) ×3 IMPLANT
TRAY DSU PREP LF (CUSTOM PROCEDURE TRAY) ×3 IMPLANT
TUBE SALEM SUMP 16 FR W/ARV (TUBING) IMPLANT
YANKAUER SUCT BULB TIP NO VENT (SUCTIONS) ×3 IMPLANT

## 2013-07-29 NOTE — Progress Notes (Signed)
Does not want sister to know anything or to receive discharge instructions.  Stay will have someone to stay with his tonight.  Does not need to urine testing for drugs unable to void because of dialysis.  He voids once a day in the am.  Ok per Dr.Fortune to omit urine test.   I-stat 8 results shown to Dr.Fortune. It is ok to proceed with the 6.1 K.

## 2013-07-29 NOTE — Brief Op Note (Signed)
07/29/2013  3:10 PM  PATIENT:  Mike Craze  53 y.o. male  PRE-OPERATIVE DIAGNOSIS:  AFTERCARE HEALING TRAUMATIC FRACTURE OF  OTHER BONE  POST-OPERATIVE DIAGNOSIS:  AFTERCARE HEALING TRAUMATIC FRACTURE OF  OTHER BONE  PROCEDURE:  Procedure(s): REMOVAL OF ARCH BARS (N/A)  SURGEON:  Surgeon(s) and Role:    * Claire Sanger, DO - Primary  PHYSICIAN ASSISTANT: Shawn Rayburn, PA  ASSISTANTS: none   ANESTHESIA:   general  EBL:  Total I/O In: 700 [I.V.:700] Out: -   BLOOD ADMINISTERED:none  DRAINS: none   LOCAL MEDICATIONS USED:  NONE  SPECIMEN:  No Specimen  DISPOSITION OF SPECIMEN:  N/A  COUNTS:  YES  TOURNIQUET:  * No tourniquets in log *  DICTATION: .Dragon Dictation  PLAN OF CARE: Discharge to home after PACU  PATIENT DISPOSITION:  PACU - hemodynamically stable.   Delay start of Pharmacological VTE agent (>24hrs) due to surgical blood loss or risk of bleeding: no

## 2013-07-29 NOTE — H&P (Signed)
Stanley [name] is an 53 y.o. male.   Chief Complaint: mandible fracture HPI: The patient is a 53 yrs old bm here for removal of his MMF hardware.  He underwent open reduction with internal fixation and maxillomandibular fixation over a month ago.  He removed his wires shortly after surgery.  He was admitted for renal failure and suicidal ideation.  He has been likely eating a regular diet.    Past Medical History  Diagnosis Date  . Coronary artery disease   . Hypertension   . GERD (gastroesophageal reflux disease)   . Suicidal ideation     & HOMICIDAL IDEATION --  06-16-2013   ADMITTED TO BEHAVIOR HEALTH  . Alcohol abuse   . Drug abuse   . Diabetic peripheral neuropathy   . End stage renal disease on dialysis NEPHROLOGIST-   DR [name]  [name]    HEMODIALYSIS --   TUES/  THURS/  SAT    Past Surgical History  Procedure Laterality Date  . Dialysis fistula creation  LAST SURGERY  APPOX  2008  . Orif mandibular fracture N/A 06/05/2013    Procedure: REPAIR OF MANDIBULAR FRACTURE x 2 with maxillo-mandibular fixation ;  Surgeon: Stanley Kos, DO;  Location: [facility];  Service: Plastics;  Laterality: N/A;  . Coronary artery bypass graft  2008  ([facility] , [place])    3 VESSEL  . Coronary angioplasty  ?    PT UNABLE TO TELL IF  BEFORE OR AFTER  CABG    History reviewed. No pertinent family history. Social History:  reports that he has been smoking Cigarettes.  He has a 80 pack-year smoking history. He has never used smokeless tobacco. He reports that he drinks about 12.6 ounces of alcohol per week. He reports that he uses illicit drugs (Marijuana and "Crack" cocaine) about 7 times per week.  Allergies: No Known Allergies  No prescriptions prior to admission    No results found for this or any previous visit (from the past 48 hour(s)). No results found.  Review of Systems  Constitutional: Negative.   HENT: Negative.   Eyes: Negative.   Respiratory: Negative.   Cardiovascular:  Negative.   Gastrointestinal: Negative.   Genitourinary: Negative.   Musculoskeletal: Negative.   Skin: Negative.   Neurological: Negative.   Psychiatric/Behavioral: Negative.     Height 6\' 3"  (1.905 m), weight 80.287 kg (177 lb). Physical Exam  Constitutional: He is oriented to person, place, and time. He appears well-developed and well-nourished.  HENT:  Head: Normocephalic.  Eyes: Conjunctivae and EOM are normal. Pupils are equal, round, and reactive to light.  Respiratory: Effort normal.  Neurological: He is alert and oriented to person, place, and time.  Skin: Skin is warm.  Psychiatric: He has a normal mood and affect. His behavior is normal. Judgment and thought content normal.     Assessment/Plan Plan for removal of hardware.  Stanley [name] 07/29/2013, 8:20 AM

## 2013-07-29 NOTE — Anesthesia Preprocedure Evaluation (Addendum)
Anesthesia Evaluation  Patient identified by MRN, date of birth, ID band Patient awake    Reviewed: Allergy & Precautions, H&P , NPO status , Patient's Chart, lab work & pertinent test results  Airway Mallampati: II TM Distance: >3 FB Neck ROM: Full  Mouth opening: Limited Mouth Opening  Dental  (+) Poor Dentition, Dental Advisory Given   Pulmonary neg pulmonary ROS, Current Smoker,    Pulmonary exam normal       Cardiovascular hypertension, Pt. on medications and Pt. on home beta blockers + CAD and + CABG Rhythm:Regular Rate:Normal     Neuro/Psych Hx of suicidal ideation in past.negative neurological ROS  negative psych ROS   GI/Hepatic Neg liver ROS, GERD-  Medicated,(+)     substance abuse  alcohol use and cocaine use,   Endo/Other  diabetes  Renal/GU Dialysis and ESRFRenal diseasenegative Renal ROS  negative genitourinary   Musculoskeletal negative musculoskeletal ROS (+)   Abdominal   Peds negative pediatric ROS (+)  Hematology negative hematology ROS (+)   Anesthesia Other Findings   Reproductive/Obstetrics                          Anesthesia Physical Anesthesia Plan  ASA: III  Anesthesia Plan: MAC   Post-op Pain Management:    Induction: Intravenous  Airway Management Planned: Nasal Cannula  Additional Equipment:   Intra-op Plan:   Post-operative Plan: Extubation in OR  Informed Consent: I have reviewed the patients History and Physical, chart, labs and discussed the procedure including the risks, benefits and alternatives for the proposed anesthesia with the patient or authorized representative who has indicated his/her understanding and acceptance.   Dental advisory given  Plan Discussed with: CRNA  Anesthesia Plan Comments: (Patient denies cocaine use in past 24 hours. Risk and complications of anesthesia, including cardiac arrhythmia and life threatening events,  explained to patient and all questions answered.)       Anesthesia Quick Evaluation

## 2013-07-29 NOTE — Discharge Instructions (Signed)
°  Post Anesthesia Home Care Instructions  Activity: Get plenty of rest for the remainder of the day. A responsible adult should stay with you for 24 hours following the procedure.  For the next 24 hours, DO NOT: -Drive a car -Paediatric nurse -Drink alcoholic beverages -Take any medication unless instructed by your physician -Make any legal decisions or sign important papers.  Meals: Start with liquid foods such as gelatin or soup. Progress to regular foods as tolerated. Avoid greasy, spicy, heavy foods. If nausea and/or vomiting occur, drink only clear liquids until the nausea and/or vomiting subsides. Call your physician if vomiting continues.  Special Instructions/Symptoms: Your throat may feel dry or sore from the anesthesia or the breathing tube placed in your throat during surgery. If this causes discomfort, gargle with warm salt water. The discomfort should disappear within 24 hours.  Post Anesthesia Home Care Instructions  Activity: Get plenty of rest for the remainder of the day. A responsible adult should stay with you for 24 hours following the procedure.  For the next 24 hours, DO NOT: -Drive a car -Paediatric nurse -Drink alcoholic beverages -Take any medication unless instructed by your physician -Make any legal decisions or sign important papers.  Meals: Start with liquid foods such as gelatin or soup. Progress to regular foods as tolerated. Avoid greasy, spicy, heavy foods. If nausea and/or vomiting occur, drink only clear liquids until the nausea and/or vomiting subsides. Call your physician if vomiting continues.  Special Instructions/Symptoms: Your throat may feel dry or sore from the anesthesia or the breathing tube placed in your throat during surgery. If this causes discomfort, gargle with warm salt water. The discomfort should disappear within 24 hours.  Soft Diet for 2 weeks

## 2013-07-29 NOTE — Op Note (Signed)
Operative Note   DATE OF OPERATION: 07/29/2013  LOCATION: [facility]  SURGICAL DIVISION: Plastic Surgery  PREOPERATIVE DIAGNOSES:  Mandible Fracture  POSTOPERATIVE DIAGNOSES:  same  PROCEDURE:  Removal of maxillomandibular hardware  SURGEON: Theodoro Kos, DO  ASSISTANT: Erlinda Hong, PA  ANESTHESIA:  General.   COMPLICATIONS: None.   INDICATIONS FOR PROCEDURE:  The patient, Stanley [name], is a 52 y.o. male born on 01/07/1961, is here for treatment of mandible fractures.   CONSENT:  Informed consent was obtained directly from the patient. Risks, benefits and alternatives were fully discussed. Specific risks including but not limited to bleeding, infection, hematoma, seroma, scarring, pain, implant infection, implant extrusion, capsular contracture, asymmetry, wound healing problems, and need for further surgery were all discussed. The patient did have an ample opportunity to have questions answered to satisfaction.   DESCRIPTION OF PROCEDURE:  The patient was taken to the operating room. SCDs were placed and IV antibiotics were given. The patient's operative site was prepped and draped in a sterile fashion. A time out was performed and all information was confirmed to be correct.  Anesthesia was administered.  The screws were removed and the arch bars without difficultly.  The patient tolerated the procedure well.  There were no complications. The patient was allowed to wake from anesthesia, extubated and taken to the recovery room in satisfactory condition.

## 2013-07-29 NOTE — Anesthesia Procedure Notes (Signed)
Procedure Name: MAC Date/Time: 07/29/2013 12:46 PM Performed by: Justice Rocher Pre-anesthesia Checklist: Patient identified, Emergency Drugs available, Suction available, Patient being monitored and Timeout performed Patient Re-evaluated:Patient Re-evaluated prior to inductionOxygen Delivery Method: Simple face mask Preoxygenation: Pre-oxygenation with 100% oxygen

## 2013-07-29 NOTE — Transfer of Care (Signed)
Immediate Anesthesia Transfer of Care Note  Patient: Stanley [name]  Procedure(s) Performed: Procedure(s) (LRB): REMOVAL OF ARCH BARS (N/A)  Patient Location: PACU  Anesthesia Type: MAC Level of Consciousness: awake, sedated, patient cooperative and responds to stimulation  Airway & Oxygen Therapy: Patient Spontanous Breathing and Patient connected to face mask oxygen  Post-op Assessment: Report given to PACU RN, Post -op Vital signs reviewed and stable and Patient moving all extremities  Post vital signs: Reviewed and stable  Complications: No apparent anesthesia complications

## 2013-07-30 ENCOUNTER — Encounter (HOSPITAL_BASED_OUTPATIENT_CLINIC_OR_DEPARTMENT_OTHER): Payer: Self-pay | Admitting: Plastic Surgery

## 2013-07-30 NOTE — Anesthesia Postprocedure Evaluation (Signed)
Anesthesia Post Note  Patient: Stanley [name]  Procedure(s) Performed: Procedure(s) (LRB): REMOVAL OF ARCH BARS (N/A)  Anesthesia type: MAC  Patient location: PACU  Post pain: Pain level controlled  Post assessment: Post-op Vital signs reviewed  Last Vitals:  Filed Vitals:   07/29/13 1400  BP: 148/98  Pulse: 86  Temp:   Resp: 20    Post vital signs: Reviewed  Level of consciousness: sedated  Complications: No apparent anesthesia complications

## 2013-11-13 ENCOUNTER — Emergency Department: Payer: Self-pay | Admitting: Student

## 2013-11-13 LAB — CBC WITH DIFFERENTIAL/PLATELET
Basophil #: 0.1 10*3/uL (ref 0.0–0.1)
Basophil %: 1.1 %
Eosinophil #: 0.1 10*3/uL (ref 0.0–0.7)
Eosinophil %: 1.4 %
HCT: 42.3 % (ref 40.0–52.0)
HGB: 13.3 g/dL (ref 13.0–18.0)
Lymphocyte #: 1.2 10*3/uL (ref 1.0–3.6)
Lymphocyte %: 20.2 %
MCH: 30.9 pg (ref 26.0–34.0)
MCHC: 31.5 g/dL — ABNORMAL LOW (ref 32.0–36.0)
MCV: 98 fL (ref 80–100)
MONO ABS: 0.5 x10 3/mm (ref 0.2–1.0)
MONOS PCT: 7.9 %
Neutrophil #: 4.2 10*3/uL (ref 1.4–6.5)
Neutrophil %: 69.4 %
PLATELETS: 250 10*3/uL (ref 150–440)
RBC: 4.32 10*6/uL — ABNORMAL LOW (ref 4.40–5.90)
RDW: 15.7 % — AB (ref 11.5–14.5)
WBC: 6.1 10*3/uL (ref 3.8–10.6)

## 2013-11-13 LAB — URINALYSIS, COMPLETE
BACTERIA: NONE SEEN
Bilirubin,UR: NEGATIVE
Glucose,UR: 50 mg/dL (ref 0–75)
Ketone: NEGATIVE
Leukocyte Esterase: NEGATIVE
Nitrite: NEGATIVE
PH: 6 (ref 4.5–8.0)
Protein: >=500
RBC,UR: 5 /HPF (ref 0–5)
SPECIFIC GRAVITY: 1.014 (ref 1.003–1.030)
Squamous Epithelial: 1
WBC UR: 1 /HPF (ref 0–5)

## 2013-11-13 LAB — COMPREHENSIVE METABOLIC PANEL
ALBUMIN: 3.5 g/dL (ref 3.4–5.0)
AST: 19 U/L (ref 15–37)
Alkaline Phosphatase: 120 U/L — ABNORMAL HIGH
Anion Gap: 6 — ABNORMAL LOW (ref 7–16)
BUN: 40 mg/dL — ABNORMAL HIGH (ref 7–18)
Bilirubin,Total: 0.4 mg/dL (ref 0.2–1.0)
CHLORIDE: 113 mmol/L — AB (ref 98–107)
CREATININE: 8.81 mg/dL — AB (ref 0.60–1.30)
Calcium, Total: 8.4 mg/dL — ABNORMAL LOW (ref 8.5–10.1)
Co2: 18 mmol/L — ABNORMAL LOW (ref 21–32)
EGFR (African American): 7 — ABNORMAL LOW
EGFR (Non-African Amer.): 6 — ABNORMAL LOW
GLUCOSE: 97 mg/dL (ref 65–99)
OSMOLALITY: 283 (ref 275–301)
POTASSIUM: 5.3 mmol/L — AB (ref 3.5–5.1)
SGPT (ALT): 19 U/L
SODIUM: 137 mmol/L (ref 136–145)
Total Protein: 7.9 g/dL (ref 6.4–8.2)

## 2014-02-09 IMAGING — XA IR VASCULAR PROCEDURE
8 series · 15 of 15 positions shown · IV contrast (IODINE)
Comparison: none

[Series 1: care upper arm · 2 of 2 slices shown (1 of 7)]
[im 1/2]
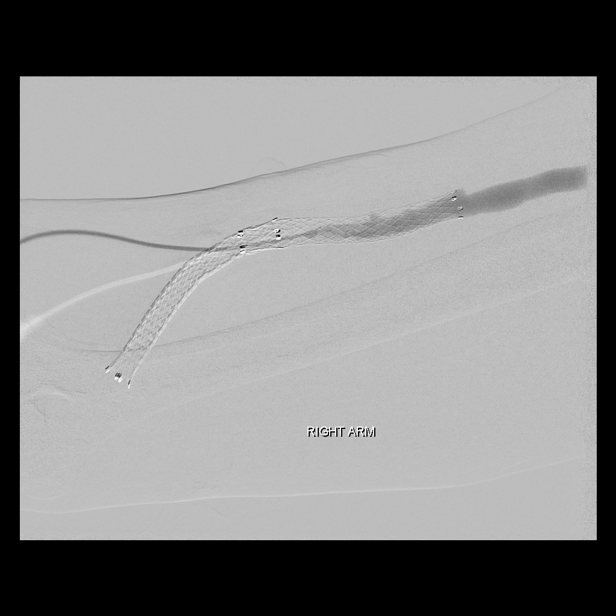
[im 2/2]
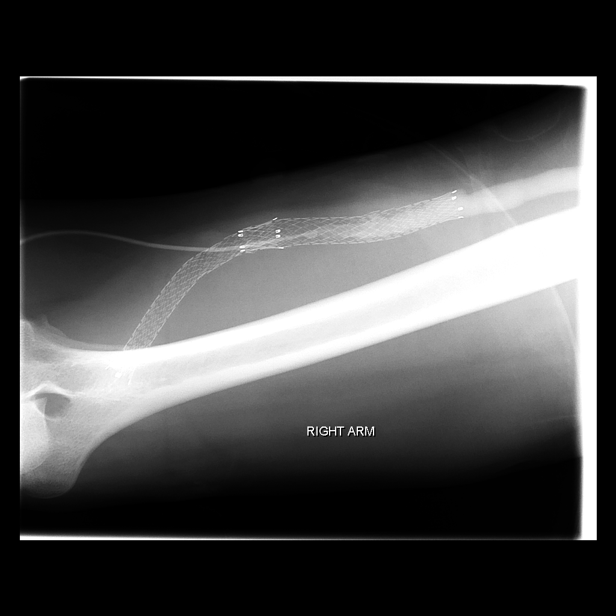

[Series 2: care upper arm · 2 of 2 slices shown (2 of 7)]
[im 1/2]
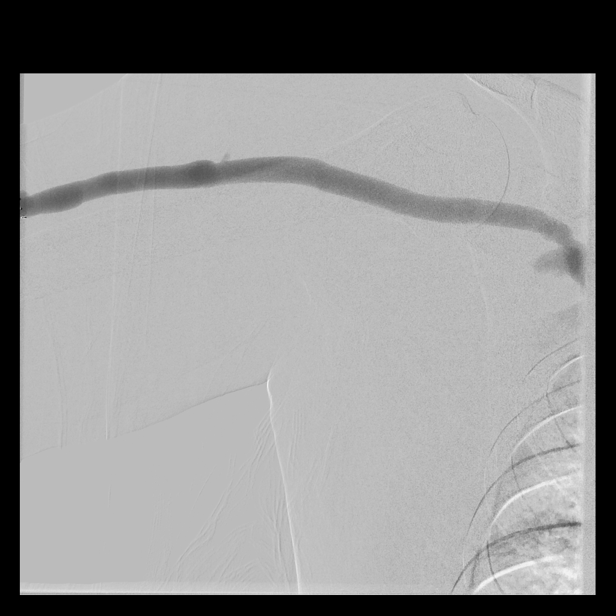
[im 2/2]
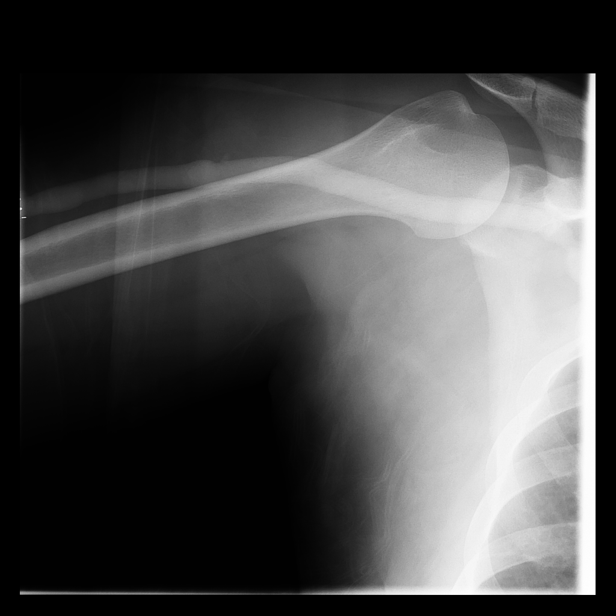

[Series 3: care upper arm · 2 of 2 slices shown (3 of 7)]
[im 1/2]
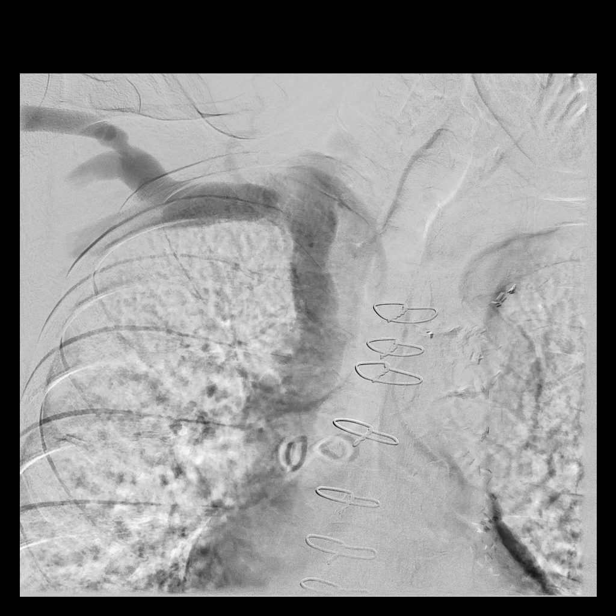
[im 2/2]
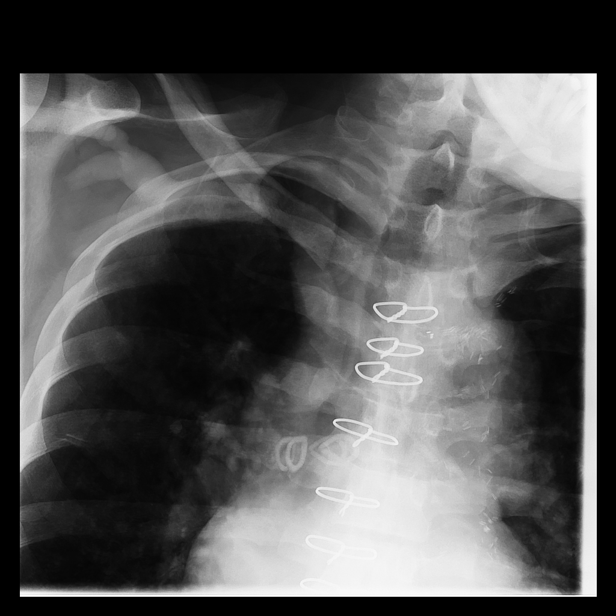

[Series 4: care upper arm · 2 of 2 slices shown (4 of 7)]
[im 1/2]
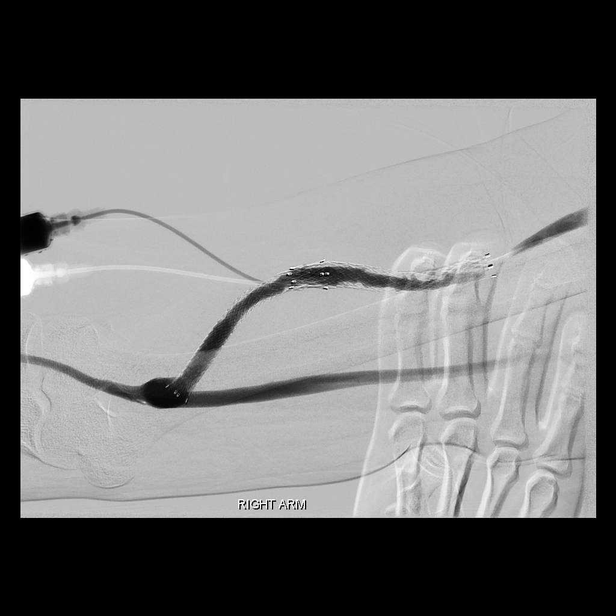
[im 2/2]
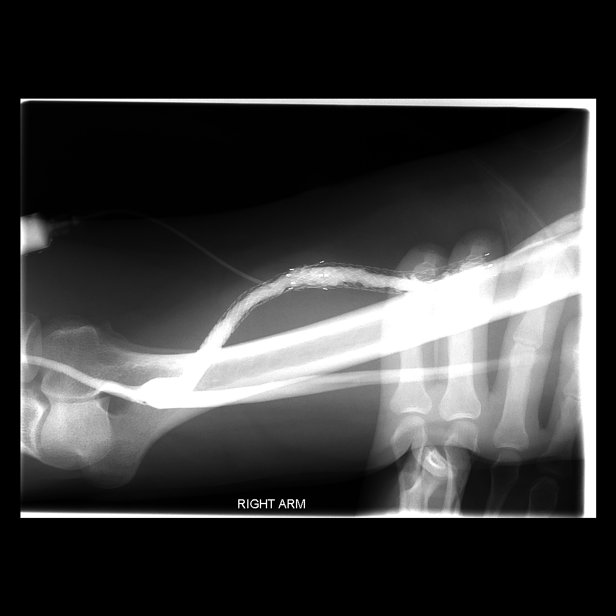

[Series 5: care upper arm · 2 of 2 slices shown (5 of 7)]
[im 1/2]
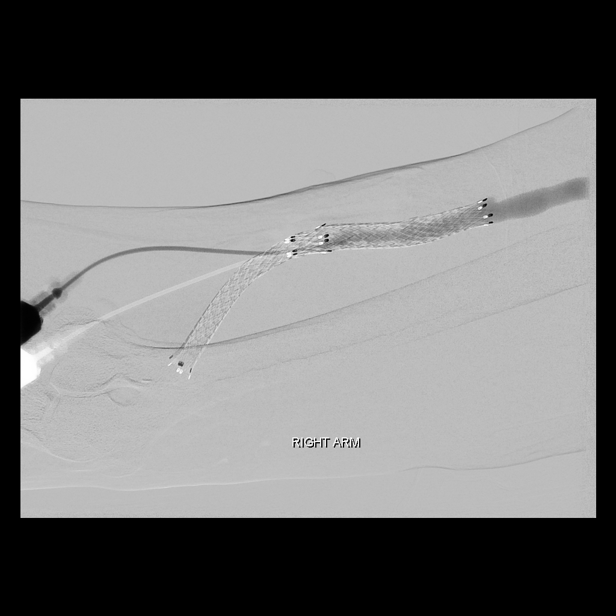
[im 2/2]
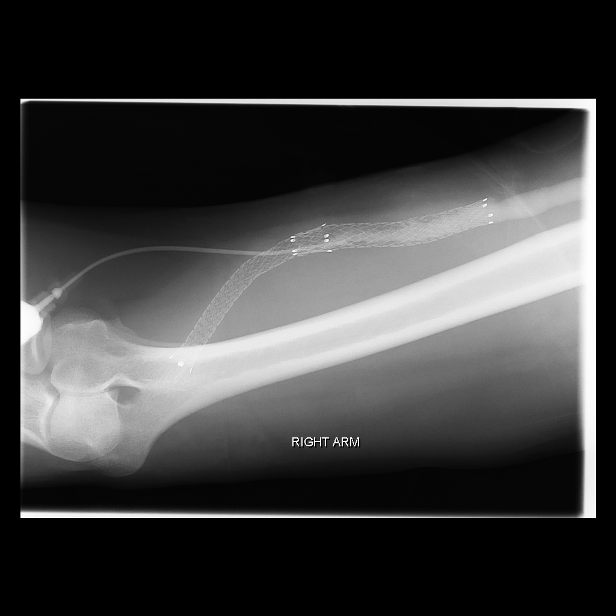

[Series 6: fl - angio · 1 of 1 slices shown]
[im 1/1]
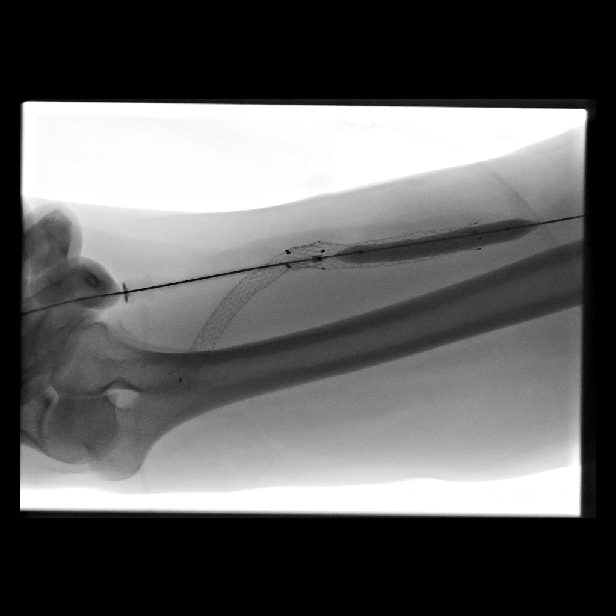

[Series 7: care upper arm · 2 of 2 slices shown (6 of 7)]
[im 1/2]
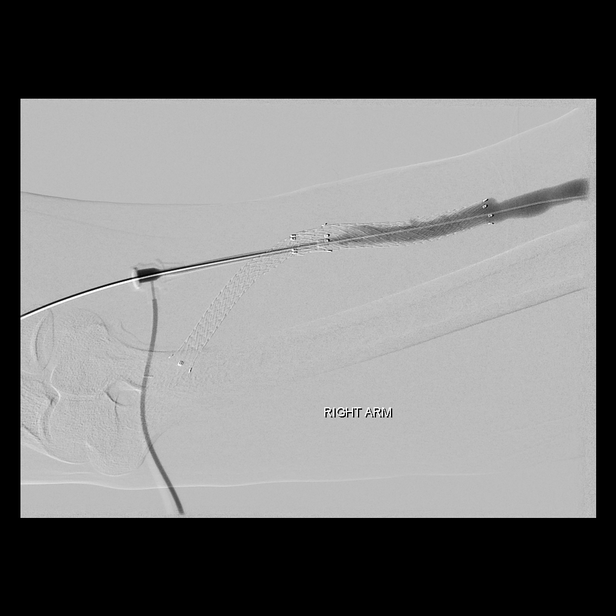
[im 2/2]
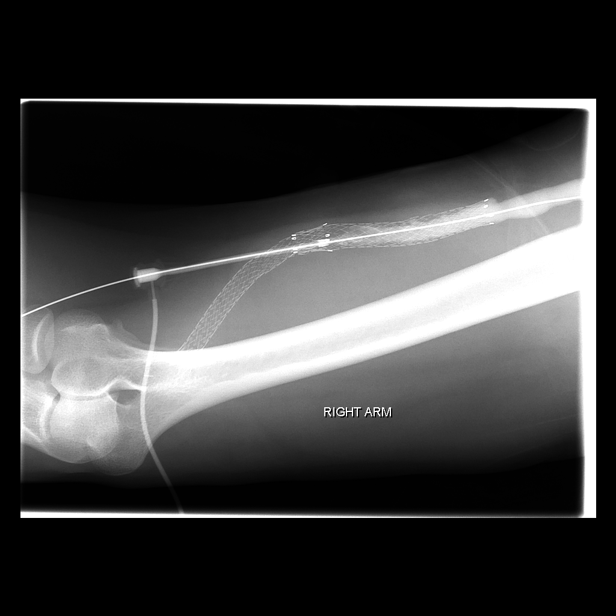

[Series 8: care upper arm · 2 of 2 slices shown (7 of 7)]
[im 1/2]
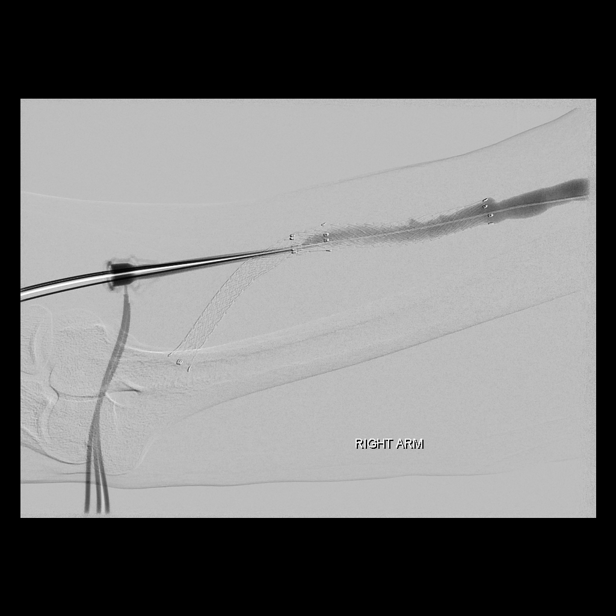
[im 2/2]
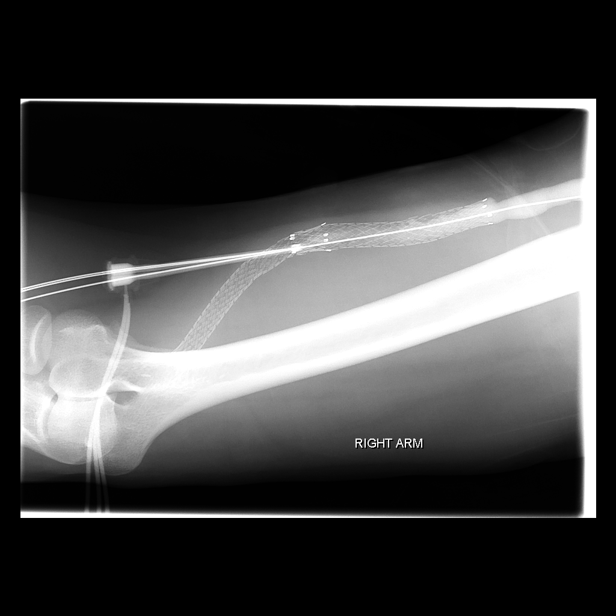

[15 of 15 positions shown; findings below may reference images not displayed]

IMAGES IMPORTED FROM THE [employer] WORKFLOW SYSTEM
NO DICTATION FOR STUDY

## 2014-03-28 ENCOUNTER — Emergency Department: Payer: Self-pay | Admitting: Emergency Medicine

## 2014-03-28 LAB — COMPREHENSIVE METABOLIC PANEL
ALT: 51 U/L
AST: 27 U/L (ref 15–37)
Albumin: 3 g/dL — ABNORMAL LOW (ref 3.4–5.0)
Alkaline Phosphatase: 120 U/L — ABNORMAL HIGH
Anion Gap: 7 (ref 7–16)
BUN: 18 mg/dL (ref 7–18)
Bilirubin,Total: 0.2 mg/dL (ref 0.2–1.0)
CALCIUM: 8 mg/dL — AB (ref 8.5–10.1)
CO2: 28 mmol/L (ref 21–32)
Chloride: 109 mmol/L — ABNORMAL HIGH (ref 98–107)
Creatinine: 5.83 mg/dL — ABNORMAL HIGH (ref 0.60–1.30)
EGFR (African American): 13 — ABNORMAL LOW
GFR CALC NON AF AMER: 11 — AB
Glucose: 110 mg/dL — ABNORMAL HIGH (ref 65–99)
Osmolality: 289 (ref 275–301)
Potassium: 3.4 mmol/L — ABNORMAL LOW (ref 3.5–5.1)
Sodium: 144 mmol/L (ref 136–145)
Total Protein: 6.5 g/dL (ref 6.4–8.2)

## 2014-03-28 LAB — CBC
HCT: 27.9 % — ABNORMAL LOW (ref 40.0–52.0)
HGB: 8.6 g/dL — ABNORMAL LOW (ref 13.0–18.0)
MCH: 31.4 pg (ref 26.0–34.0)
MCHC: 30.9 g/dL — ABNORMAL LOW (ref 32.0–36.0)
MCV: 102 fL — ABNORMAL HIGH (ref 80–100)
PLATELETS: 234 10*3/uL (ref 150–440)
RBC: 2.74 10*6/uL — ABNORMAL LOW (ref 4.40–5.90)
RDW: 16.7 % — AB (ref 11.5–14.5)
WBC: 7 10*3/uL (ref 3.8–10.6)

## 2014-04-11 ENCOUNTER — Ambulatory Visit: Payer: Self-pay | Admitting: Gastroenterology

## 2014-05-21 IMAGING — XA IR VASCULAR PROCEDURE
5 series · 10 of 10 positions shown · IV contrast (IODINE)
Comparison: none

[Series 1: care upper arm · 2 of 2 slices shown (1 of 5)]
[im 1/2]
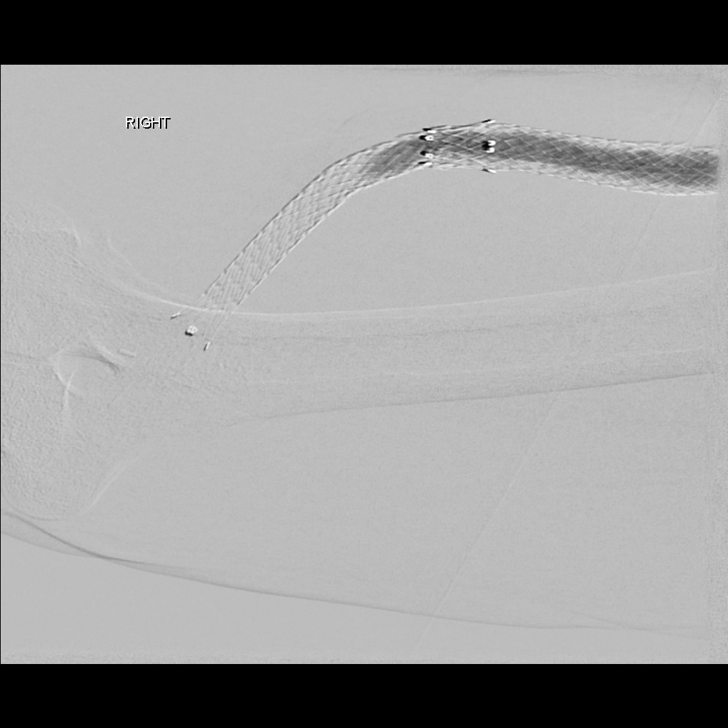
[im 2/2]
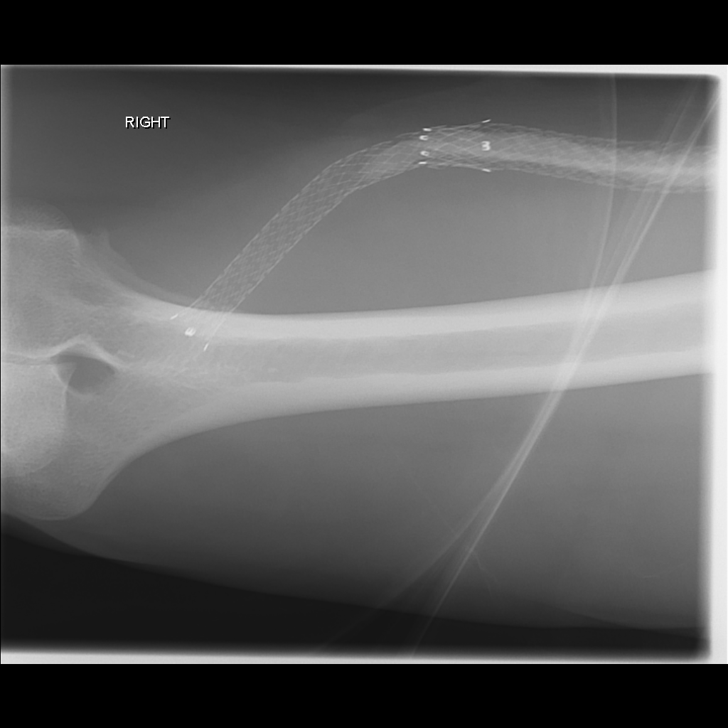

[Series 2: care upper arm · 2 of 2 slices shown (2 of 5)]
[im 1/2]
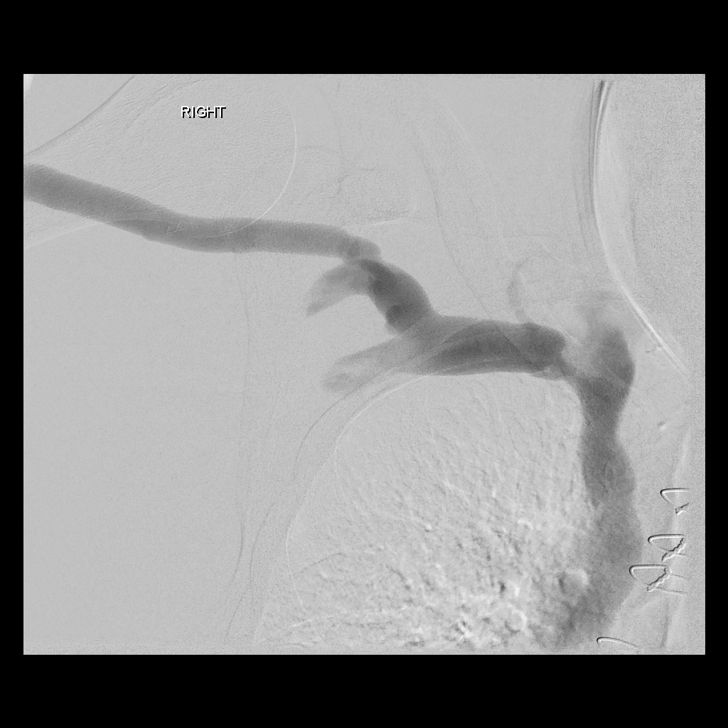
[im 2/2]
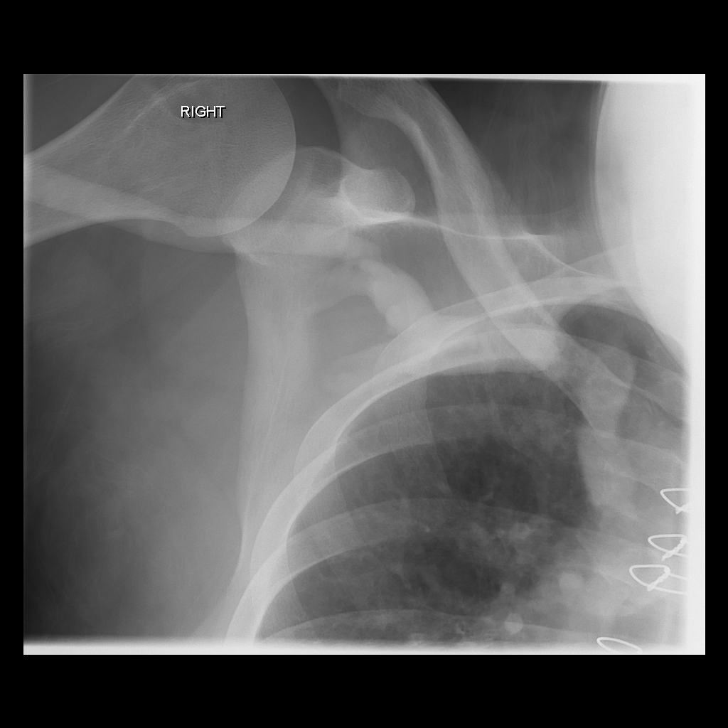

[Series 3: care upper arm · 2 of 2 slices shown (3 of 5)]
[im 1/2]
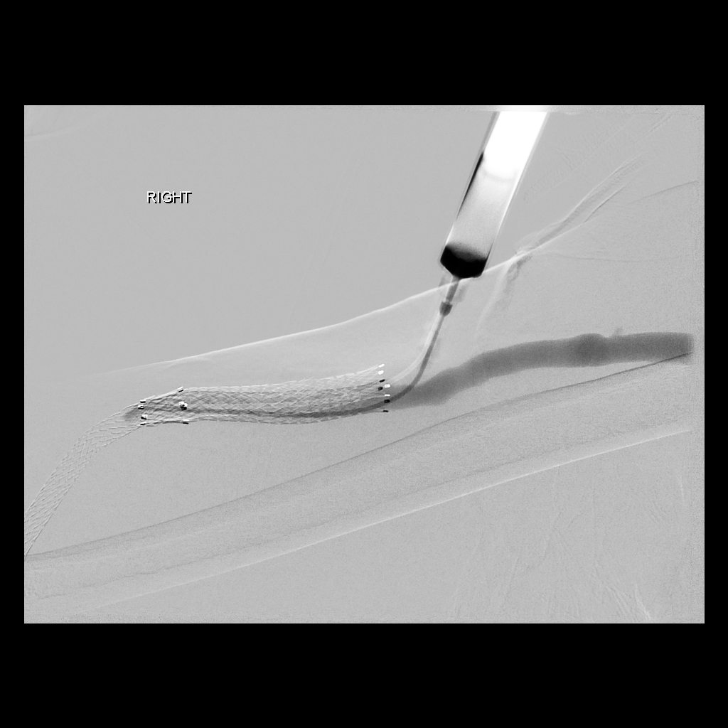
[im 2/2]
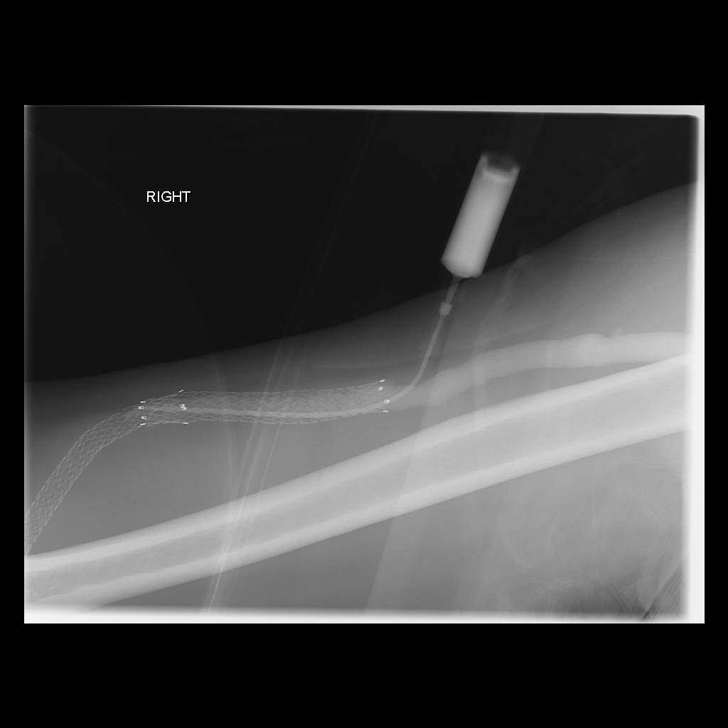

[Series 4: care upper arm · 2 of 2 slices shown (4 of 5)]
[im 1/2]
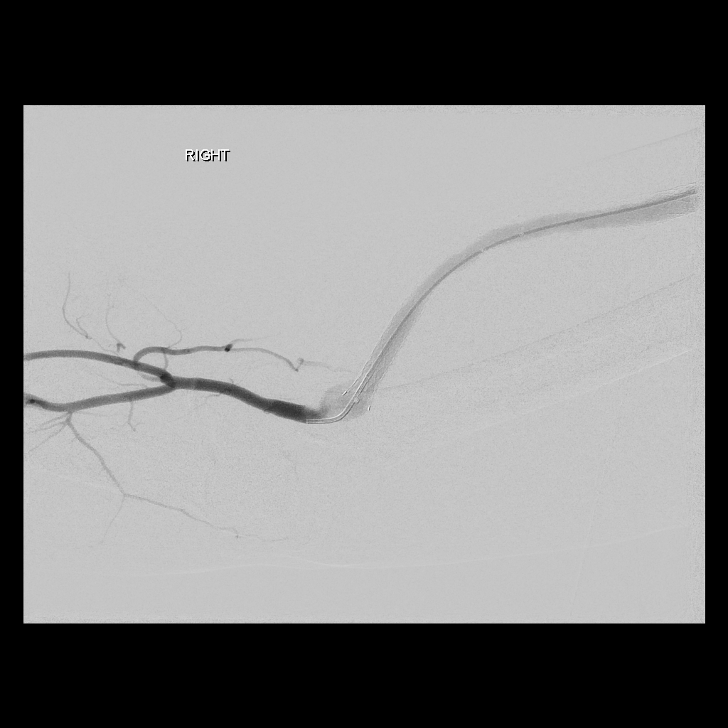
[im 2/2]
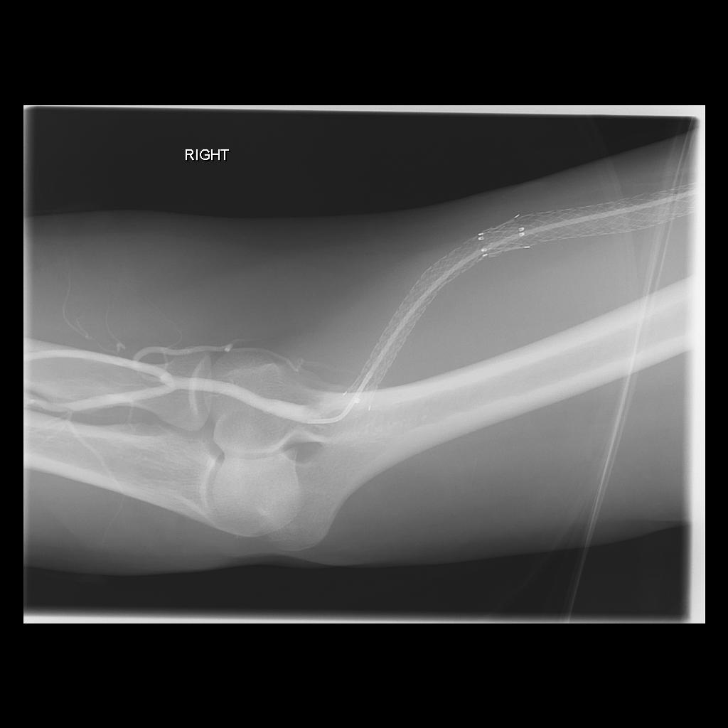

[Series 5: care upper arm · 2 of 2 slices shown (5 of 5)]
[im 1/2]
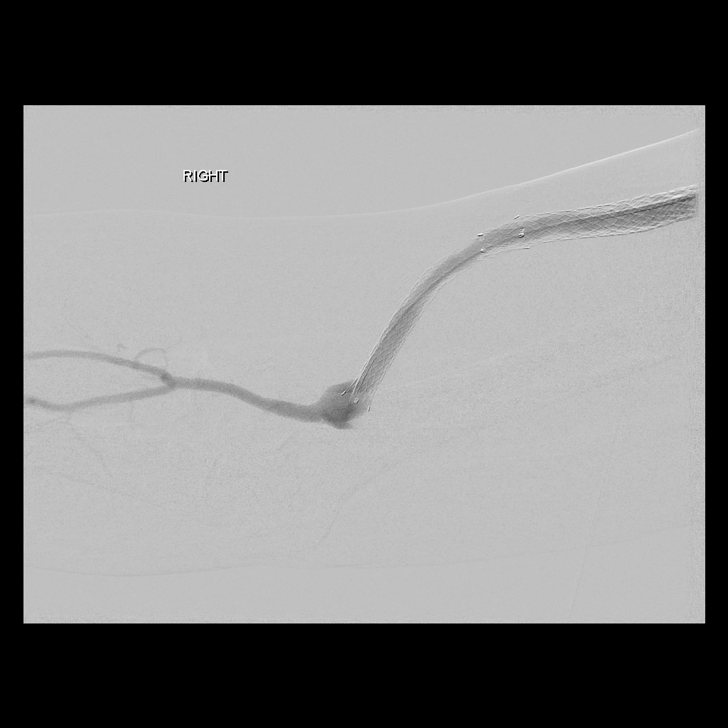
[im 2/2]
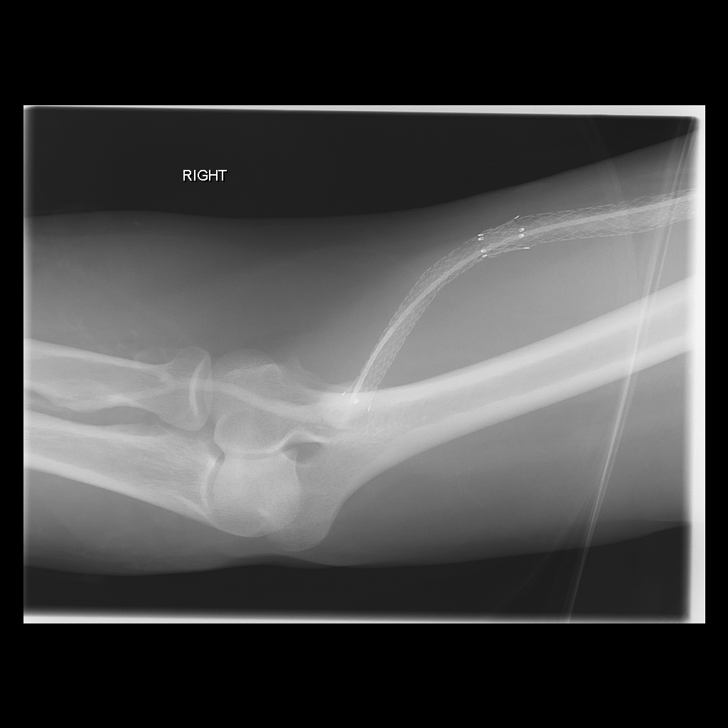

[10 of 10 positions shown; findings below may reference images not displayed]

IMAGES IMPORTED FROM THE [employer] WORKFLOW SYSTEM
NO DICTATION FOR STUDY

## 2014-06-10 ENCOUNTER — Emergency Department: Payer: Self-pay | Admitting: Emergency Medicine

## 2014-06-10 LAB — COMPREHENSIVE METABOLIC PANEL
ALBUMIN: 3.4 g/dL — AB
ALK PHOS: 156 U/L — AB
Anion Gap: 11 (ref 7–16)
BUN: 20 mg/dL
Bilirubin,Total: 1.9 mg/dL — ABNORMAL HIGH
Calcium, Total: 8.9 mg/dL
Chloride: 102 mmol/L
Co2: 30 mmol/L
Creatinine: 5.42 mg/dL — ABNORMAL HIGH
EGFR (African American): 13 — ABNORMAL LOW
EGFR (Non-African Amer.): 11 — ABNORMAL LOW
GLUCOSE: 126 mg/dL — AB
POTASSIUM: 5.1 mmol/L
SGOT(AST): 53 U/L — ABNORMAL HIGH
SGPT (ALT): 32 U/L
SODIUM: 143 mmol/L
Total Protein: 7.3 g/dL

## 2014-06-10 LAB — CBC WITH DIFFERENTIAL/PLATELET
BASOS ABS: 0 10*3/uL (ref 0.0–0.1)
Basophil %: 0.6 %
EOS ABS: 0 10*3/uL (ref 0.0–0.7)
Eosinophil %: 0.7 %
HCT: 38.8 % — AB (ref 40.0–52.0)
HGB: 12.7 g/dL — ABNORMAL LOW (ref 13.0–18.0)
Lymphocyte #: 1 10*3/uL (ref 1.0–3.6)
Lymphocyte %: 13.6 %
MCH: 30.1 pg (ref 26.0–34.0)
MCHC: 32.7 g/dL (ref 32.0–36.0)
MCV: 92 fL (ref 80–100)
MONOS PCT: 10.4 %
Monocyte #: 0.7 x10 3/mm (ref 0.2–1.0)
NEUTROS PCT: 74.7 %
Neutrophil #: 5.3 10*3/uL (ref 1.4–6.5)
PLATELETS: 201 10*3/uL (ref 150–440)
RBC: 4.2 10*6/uL — ABNORMAL LOW (ref 4.40–5.90)
RDW: 21.5 % — AB (ref 11.5–14.5)
WBC: 7 10*3/uL (ref 3.8–10.6)

## 2014-06-10 LAB — TROPONIN I
TROPONIN-I: 0.05 ng/mL — AB
Troponin-I: 0.04 ng/mL — ABNORMAL HIGH

## 2014-06-22 ENCOUNTER — Emergency Department (HOSPITAL_COMMUNITY)
Admission: EM | Admit: 2014-06-22 | Discharge: 2014-06-22 | Disposition: A | Discharge status: Home / Self Care | Payer: Medicare Other | Attending: Emergency Medicine | Admitting: Emergency Medicine

## 2014-06-22 ENCOUNTER — Emergency Department (HOSPITAL_COMMUNITY): Payer: Medicare Other

## 2014-06-22 ENCOUNTER — Encounter (HOSPITAL_COMMUNITY): Payer: Self-pay | Admitting: *Deleted

## 2014-06-22 DIAGNOSIS — Z7982 Long term (current) use of aspirin: Secondary | ICD-10-CM | POA: Insufficient documentation

## 2014-06-22 DIAGNOSIS — E1140 Type 2 diabetes mellitus with diabetic neuropathy, unspecified: Secondary | ICD-10-CM | POA: Insufficient documentation

## 2014-06-22 DIAGNOSIS — R109 Unspecified abdominal pain: Secondary | ICD-10-CM | POA: Diagnosis present

## 2014-06-22 DIAGNOSIS — N186 End stage renal disease: Secondary | ICD-10-CM | POA: Insufficient documentation

## 2014-06-22 DIAGNOSIS — R0602 Shortness of breath: Secondary | ICD-10-CM | POA: Insufficient documentation

## 2014-06-22 DIAGNOSIS — R140 Abdominal distension (gaseous): Secondary | ICD-10-CM | POA: Insufficient documentation

## 2014-06-22 DIAGNOSIS — Z992 Dependence on renal dialysis: Secondary | ICD-10-CM | POA: Insufficient documentation

## 2014-06-22 DIAGNOSIS — R1084 Generalized abdominal pain: Secondary | ICD-10-CM | POA: Insufficient documentation

## 2014-06-22 DIAGNOSIS — Z79899 Other long term (current) drug therapy: Secondary | ICD-10-CM | POA: Insufficient documentation

## 2014-06-22 DIAGNOSIS — Z720 Tobacco use: Secondary | ICD-10-CM | POA: Diagnosis not present

## 2014-06-22 DIAGNOSIS — Z9861 Coronary angioplasty status: Secondary | ICD-10-CM | POA: Insufficient documentation

## 2014-06-22 DIAGNOSIS — I2510 Atherosclerotic heart disease of native coronary artery without angina pectoris: Secondary | ICD-10-CM | POA: Diagnosis not present

## 2014-06-22 DIAGNOSIS — K219 Gastro-esophageal reflux disease without esophagitis: Secondary | ICD-10-CM | POA: Insufficient documentation

## 2014-06-22 DIAGNOSIS — R110 Nausea: Secondary | ICD-10-CM | POA: Insufficient documentation

## 2014-06-22 DIAGNOSIS — I120 Hypertensive chronic kidney disease with stage 5 chronic kidney disease or end stage renal disease: Secondary | ICD-10-CM | POA: Diagnosis not present

## 2014-06-22 DIAGNOSIS — R188 Other ascites: Secondary | ICD-10-CM | POA: Insufficient documentation

## 2014-06-22 LAB — CBC WITH DIFFERENTIAL/PLATELET
BASOS PCT: 0 % (ref 0–1)
Basophils Absolute: 0 10*3/uL (ref 0.0–0.1)
Eosinophils Absolute: 0 10*3/uL (ref 0.0–0.7)
Eosinophils Relative: 1 % (ref 0–5)
HCT: 37.9 % — ABNORMAL LOW (ref 39.0–52.0)
Hemoglobin: 11.8 g/dL — ABNORMAL LOW (ref 13.0–17.0)
LYMPHS ABS: 1 10*3/uL (ref 0.7–4.0)
LYMPHS PCT: 15 % (ref 12–46)
MCH: 29.2 pg (ref 26.0–34.0)
MCHC: 31.1 g/dL (ref 30.0–36.0)
MCV: 93.8 fL (ref 78.0–100.0)
MONO ABS: 0.8 10*3/uL (ref 0.1–1.0)
MONOS PCT: 12 % (ref 3–12)
NEUTROS ABS: 4.8 10*3/uL (ref 1.7–7.7)
NEUTROS PCT: 72 % (ref 43–77)
Platelets: 219 10*3/uL (ref 150–400)
RBC: 4.04 MIL/uL — AB (ref 4.22–5.81)
RDW: 20.6 % — ABNORMAL HIGH (ref 11.5–15.5)
WBC: 6.7 10*3/uL (ref 4.0–10.5)

## 2014-06-22 LAB — URINALYSIS, ROUTINE W REFLEX MICROSCOPIC
Bilirubin Urine: NEGATIVE
GLUCOSE, UA: NEGATIVE mg/dL
KETONES UR: NEGATIVE mg/dL
LEUKOCYTES UA: NEGATIVE
NITRITE: NEGATIVE
PH: 8 (ref 5.0–8.0)
Protein, ur: >300 mg/dL — AB
Specific Gravity, Urine: 1.019 (ref 1.005–1.030)
Urobilinogen, UA: 0.2 mg/dL (ref 0.0–1.0)

## 2014-06-22 LAB — COMPREHENSIVE METABOLIC PANEL
ALT: 27 U/L (ref 0–53)
AST: 29 U/L (ref 0–37)
Albumin: 3.1 g/dL — ABNORMAL LOW (ref 3.5–5.2)
Alkaline Phosphatase: 175 U/L — ABNORMAL HIGH (ref 39–117)
Anion gap: 14 (ref 5–15)
BUN: 19 mg/dL (ref 6–23)
CO2: 25 mmol/L (ref 19–32)
Calcium: 9.2 mg/dL (ref 8.4–10.5)
Chloride: 101 mmol/L (ref 96–112)
Creatinine, Ser: 5.21 mg/dL — ABNORMAL HIGH (ref 0.50–1.35)
GFR calc non Af Amer: 11 mL/min — ABNORMAL LOW (ref >90)
GFR, EST AFRICAN AMERICAN: 13 mL/min — AB (ref >90)
Glucose, Bld: 88 mg/dL (ref 70–99)
POTASSIUM: 4.5 mmol/L (ref 3.5–5.1)
SODIUM: 140 mmol/L (ref 135–145)
Total Bilirubin: 0.9 mg/dL (ref 0.3–1.2)
Total Protein: 6.7 g/dL (ref 6.0–8.3)

## 2014-06-22 LAB — URINE MICROSCOPIC-ADD ON

## 2014-06-22 LAB — LIPASE, BLOOD: LIPASE: 30 U/L (ref 11–59)

## 2014-06-22 MED ORDER — IOHEXOL 300 MG/ML  SOLN
100.0000 mL | Freq: Once | INTRAMUSCULAR | Status: AC | PRN
  Administered 2014-06-22: 100 mL via INTRAVENOUS

## 2014-06-22 MED ORDER — IOHEXOL 300 MG/ML  SOLN
25.0000 mL | Freq: Once | INTRAMUSCULAR | Status: AC | PRN
  Administered 2014-06-22: 25 mL via ORAL

## 2014-06-22 MED ORDER — HYDROCODONE-ACETAMINOPHEN 5-325 MG PO TABS: 1.0000 | ORAL_TABLET | Freq: Four times a day (QID) | ORAL | Status: DC | PRN

## 2014-06-22 MED ORDER — SENNOSIDES-DOCUSATE SODIUM 8.6-50 MG PO TABS: 1.0000 | ORAL_TABLET | Freq: Every day | ORAL | Status: AC

## 2014-06-22 MED ORDER — HYDROMORPHONE HCL 1 MG/ML IJ SOLN
1.0000 mg | Freq: Once | INTRAMUSCULAR | Status: AC
  Administered 2014-06-22: 1 mg via INTRAVENOUS
  Filled 2014-06-22: qty 1

## 2014-06-22 NOTE — ED Provider Notes (Signed)
CSN: [id number]     Arrival date & time 06/22/14  0912 History   First MD Initiated Contact with Patient 06/22/14 [phone]     Chief Complaint  Patient presents with  . Abdominal Pain  . Shortness of Breath     HPI  Patient presents with concern of abdominal distention, difficult to breathing secondary to this. Patient states that symptoms began about 3 weeks after colonoscopy.  Since that time symptoms have increased, with no relief from anything. Initially the patient has decreased bowel movements, but has had improved frequency of bowel movements since that time. No fever, though the patient awakens at night with chills. No chest pain. Patient has no history of abdominal surgery, but does have renal failure, on dialysis. Last dialysis was yesterday, uncomplicated.  Patient smokes, and I counseled him on the need for smoking cessation, particularly given his history of peripheral vascular disease, his previous warning that if he continues to smoke is likely to require amputation of his lower extremity. Past Medical History  Diagnosis Date  . Coronary artery disease   . Hypertension   . GERD (gastroesophageal reflux disease)   . Suicidal ideation     & HOMICIDAL IDEATION --  06-16-2013   ADMITTED TO BEHAVIOR HEALTH  . Alcohol abuse   . Drug abuse   . Diabetic peripheral neuropathy   . End stage renal disease on dialysis NEPHROLOGIST-   DR [facility]  [place]    HEMODIALYSIS --   TUES/  THURS/  SAT   Past Surgical History  Procedure Laterality Date  . Dialysis fistula creation  LAST SURGERY  APPOX  2008  . Orif mandibular fracture N/A 06/05/2013    Procedure: REPAIR OF MANDIBULAR FRACTURE x 2 with maxillo-mandibular fixation ;  Surgeon: Theodoro Kos, DO;  Location: [facility];  Service: Plastics;  Laterality: N/A;  . Coronary artery bypass graft  2008  ([facility] , [place])    3 VESSEL  . Coronary angioplasty  ?    PT UNABLE TO TELL IF  BEFORE OR AFTER  CABG  . Mandibular hardware removal  N/A 07/29/2013    Procedure: REMOVAL OF ARCH BARS;  Surgeon: Theodoro Kos, DO;  Location: [place];  Service: Plastics;  Laterality: N/A;   No family history on file. History  Substance Use Topics  . Smoking status: Current Every Day Smoker -- 0.50 packs/day for 40 years    Types: Cigarettes  . Smokeless tobacco: Never Used  . Alcohol Use: 12.6 oz/week    21 Cans of beer per week    Review of Systems  Constitutional:       Per HPI, otherwise negative  HENT:       Per HPI, otherwise negative  Respiratory:       Per HPI, otherwise negative  Cardiovascular:       Per HPI, otherwise negative  Gastrointestinal: Positive for nausea and abdominal pain. Negative for vomiting.  Endocrine:       Negative aside from HPI  Genitourinary:       Neg aside from HPI   Musculoskeletal:       Per HPI, otherwise negative  Skin: Negative.   Neurological: Negative for syncope.      Allergies  Review of patient's allergies indicates no known allergies.  Home Medications   Prior to Admission medications   Medication Sig Start Date End Date Taking? Authorizing Provider  amLODipine (NORVASC) 5 MG tablet Take 5 mg by mouth daily.  Yes Historical Provider, MD  aspirin EC 81 MG tablet Take 81 mg by mouth daily.   Yes Historical Provider, MD  atorvastatin (LIPITOR) 40 MG tablet Take 40 mg by mouth daily.   Yes Historical Provider, MD  benazepril (LOTENSIN) 10 MG tablet Take 10 mg by mouth daily.   Yes Historical Provider, MD  calcium acetate (PHOSLO) 667 MG capsule Take 2,001 mg by mouth 3 (three) times daily with meals.   Yes Historical Provider, MD  gabapentin (NEURONTIN) 300 MG capsule Take 300 mg by mouth daily.    Yes Historical Provider, MD  labetalol (NORMODYNE) 200 MG tablet Take 200 mg by mouth daily.    Yes Historical Provider, MD  Multiple Vitamins-Minerals (MULTIVITAMIN WITH MINERALS) tablet Take 1 tablet by mouth daily.   Yes Historical Provider, MD  omeprazole  (PRILOSEC) 20 MG capsule Take 20 mg by mouth every morning.    Yes Historical Provider, MD   BP 146/92 mmHg  Pulse 101  Temp(Src) 98.5 F (36.9 C) (Oral)  Resp 19  Ht 6\' 3"  (1.905 m)  Wt 170 lb (77.111 kg)  BMI 21.25 kg/m2  SpO2 95% Physical Exam  Constitutional: He is oriented to person, place, and time. He appears well-developed. No distress.  HENT:  Head: Normocephalic and atraumatic.  Eyes: Conjunctivae and EOM are normal.  Cardiovascular: Normal rate and regular rhythm.   Pulmonary/Chest: Effort normal. No stridor. No respiratory distress.  Abdominal: He exhibits distension. There is tenderness. There is guarding. There is no rebound.  Musculoskeletal: He exhibits no edema.  Fistula in place, no active bleeding, palpable thrill  Neurological: He is alert and oriented to person, place, and time.  Skin: Skin is warm and dry.  Psychiatric: He has a normal mood and affect.  Nursing note and vitals reviewed.   ED Course  Procedures (including critical care time) Labs Review Labs Reviewed  CBC WITH DIFFERENTIAL/PLATELET - Abnormal; Notable for the following:    RBC 4.04 (*)    Hemoglobin 11.8 (*)    HCT 37.9 (*)    RDW 20.6 (*)    All other components within normal limits  COMPREHENSIVE METABOLIC PANEL - Abnormal; Notable for the following:    Creatinine, Ser 5.21 (*)    Albumin 3.1 (*)    Alkaline Phosphatase 175 (*)    GFR calc non Af Amer 11 (*)    GFR calc Af Amer 13 (*)    All other components within normal limits  URINALYSIS, ROUTINE W REFLEX MICROSCOPIC - Abnormal; Notable for the following:    Hgb urine dipstick SMALL (*)    Protein, ur >300 (*)    All other components within normal limits  LIPASE, BLOOD  URINE MICROSCOPIC-ADD ON    Imaging Review Ct Abdomen Pelvis W Contrast  06/22/2014   CLINICAL DATA:  Abdominal distension beginning 2-3 days ago. Colonoscopy 3 weeks ago. End-stage renal disease.  EXAM: CT ABDOMEN AND PELVIS WITH CONTRAST  TECHNIQUE:  Multidetector CT imaging of the abdomen and pelvis was performed using the standard protocol following bolus administration of intravenous contrast.  CONTRAST:  115mL OMNIPAQUE IOHEXOL 300 MG/ML  SOLN  COMPARISON:  None.  FINDINGS: Lung bases are within normal.  There is cardiomegaly.  Abdominal images demonstrate the liver, spleen, pancreas, adrenal glands and gallbladder to be within normal. Kidneys are somewhat small without evidence of hydronephrosis or nephrolithiasis. There are a few small bilateral renal cysts and a few sub cm renal cortical hypodensities too small to characterize but  likely cysts. Ureters are normal. Appendix is normal. There is mild calcified plaque over the abdominal aorta and iliac arteries.  There is moderate ascites. There is no free peritoneal air. There is mild diffuse wall thickening of the greater curvature of the stomach measuring 2 cm in thickness. There is minimal diverticulosis of the colon. The colon is otherwise within normal. Small bowel is within normal.  Pelvic images again demonstrate evidence of moderate ascites. The bladder, prostate and rectum are normal. There are degenerative changes of the hips and lumbosacral spine.  IMPRESSION: Moderate ascites.  No free air or evidence of colonic injury.  Mild diffuse wall thickening over the greater curvature of the stomach likely due to gastritis and less likely a neoplastic process.  Mild colonic diverticulosis.  Cardiomegaly.  Small kidneys with bilateral renal cysts.   Electronically Signed   By: Marin Olp M.D.   On: 06/22/2014 11:53   2:36 PM Patient in no distress. He, his sister and I discussed all findings, return precautions, follow-up instructions.  MDM   Patient presents with ongoing abdominal pain. Patient is ascites, but no other acute abdominal processes. Patient remains awake, alert, afebrile, with low suspicion for peritonitis, or other occult infection.     Carmin Muskrat, MD 06/22/14 [phone]

## 2014-06-22 NOTE — ED Notes (Signed)
Pt presents via POV c/o abdominal distention and shortness of breath.  Pt states he had a colonoscopy 3 weeks ago at [facility] and his abdomen has been distended ever since.  Pt also c/o shortness of breath.  Pt a x 4, NAD.

## 2014-06-22 NOTE — Discharge Instructions (Signed)
As discussed, your evaluation today has been largely reassuring.  But, it is important that you monitor your condition carefully, and do not hesitate to return to the ED if you develop new, or concerning changes in your condition.  Otherwise, please follow-up with your physician for appropriate ongoing care.  Ascites Ascites is a gathering of fluid in the belly (abdomen). This is most often caused by liver disease. It may also be caused by a number of other less common problems. It causes a ballooning out (distension) of the abdomen. CAUSES  Scarring of the liver (cirrhosis) is the most common cause of ascites. Other causes include:  Infection or inflammation in the abdomen.  Cancer in the abdomen.  Heart failure.  Certain forms of kidney failure (nephritic syndrome).  Inflammation of the pancreas.  Clots in the veins of the liver. SYMPTOMS  In the early stages of ascites, you may not have any symptoms. The main symptom of ascites is a sense of abdominal bloating. This is due to the presence of fluid. This may also cause an increase in abdominal or waist size. People with this condition can develop swelling in the legs, and men can develop a swollen scrotum. When there is a lot of fluid, it may be hard to breath. Stretching of the abdomen by fluid can be painful. DIAGNOSIS  Certain features of your medical history, such as a history of liver disease and of an enlarging abdomen, can suggest the presence of ascites. The diagnosis of ascites can be made on physical exam by your caregiver. An abdominal ultrasound examination can confirm that ascites is present, and estimate the amount of fluid. Once ascites is confirmed, it is important to determine its cause. Again, a history of one of the conditions listed in "CAUSES" provides a strong clue. A physical exam is important, and blood and X-ray tests may be needed. During a procedure called paracentesis, a sample of fluid is removed from the  abdomen. This can determine certain key features about the fluid, such as whether or not infection or cancer is present. Your caregiver will determine if a paracentesis is necessary. They will describe the procedure to you. PREVENTION  Ascites is a complication of other conditions. Therefore to prevent ascites, you must seek treatment for any significant health conditions you have. Once ascites is present, careful attention to fluid and salt intake may help prevent it from getting worse. If you have ascites, you should not drink alcohol. PROGNOSIS  The prognosis of ascites depends on the underlying disease. If the disease is reversible, such as with certain infections or with heart failure, then ascites may improve or disappear. When ascites is caused by cirrhosis, then it indicates that the liver disease has worsened, and further evaluation and treatment of the liver disease is needed. If your ascites is caused by cancer, then the success or failure of the cancer treatment will determine whether your ascites will improve or worsen. RISKS AND COMPLICATIONS  Ascites is likely to worsen if it is not properly diagnosed and treated. A large amount of ascites can cause pain and difficulty breathing. The main complication, besides worsening, is infection (called spontaneous bacterial peritonitis). This requires prompt treatment. TREATMENT  The treatment of ascites depends on its cause. When liver disease is your cause, medical management using water pills (diuretics) and decreasing salt intake is often effective. Ascites due to peritoneal inflammation or malignancy (cancer) alone does not respond to salt restriction and diuretics. Hospitalization is sometimes required. If the  treatment of ascites cannot be managed with medications, a number of other treatments are available. Your caregivers will help you decide which will work best for you. Some of these are:  Removal of fluid from the abdomen  (paracentesis).  Fluid from the abdomen is passed into a vein (peritoneovenous shunting).  Liver transplantation.  Transjugular intrahepatic portosystemic stent shunt. HOME CARE INSTRUCTIONS  It is important to monitor body weight and the intake and output of fluids. Weigh yourself at the same time every day. Record your weights. Fluid restriction may be necessary. It is also important to know your salt intake. The more salt you take in, the more fluid you will retain. Ninety percent of people with ascites respond to this approach.  Follow any directions for medicines carefully.  Follow up with your caregiver, as directed.  Report any changes in your health, especially any new or worsening symptoms.  If your ascites is from liver disease, avoid alcohol and other substances toxic to the liver. SEEK MEDICAL CARE IF:   Your weight increases more than a few pounds in a few days.  Your abdominal or waist size increases.  You develop swelling in your legs.  You had swelling and it worsens. SEEK IMMEDIATE MEDICAL CARE IF:   You develop a fever.  You develop new abdominal pain.  You develop difficulty breathing.  You develop confusion.  You have bleeding from the mouth, stomach, or rectum. MAKE SURE YOU:   Understand these instructions.  Will watch your condition.  Will get help right away if you are not doing well or get worse. Document Released: 02/28/2005 Document Revised: 05/23/2011 Document Reviewed: 09/29/2006 [facility] Patient Information 2015 [place], [place]. This information is not intended to replace advice given to you by your health care provider. Make sure you discuss any questions you have with your health care provider.

## 2014-07-04 ENCOUNTER — Ambulatory Visit: Admit: 2014-07-04 | Disposition: A | Discharge status: Home / Self Care | Payer: Self-pay | Admitting: Gastroenterology

## 2014-07-04 ENCOUNTER — Encounter: Payer: Self-pay | Admitting: Gastroenterology

## 2014-07-04 ENCOUNTER — Other Ambulatory Visit: Payer: Medicare Other

## 2014-07-04 LAB — BODY FLUID CELL COUNT WITH DIFFERENTIAL
Basophil: 0 %
Eosinophil: 0 %
LYMPHS PCT: 28 %
Neutrophils: 0 %
Nucleated Cell Count: 337 /mm3
Other Cells BF: 0 %
Other Mononuclear Cells: 72 %

## 2014-07-04 LAB — PROTEIN, BODY FLUID: Protein, Body Fluid: 4 g/dL

## 2014-07-04 LAB — ALBUMIN, FLUID (OTHER): Body Fluid Albumin: 2 g/dL

## 2014-07-04 LAB — LACTATE DEHYDROGENASE, PLEURAL OR PERITONEAL FLUID: LDH, Body Fluid: 111 U/L

## 2014-07-04 LAB — GLUCOSE, SEROUS FLUID: GLUCOSE, BODY FLUID: 92 mg/dL

## 2014-07-04 NOTE — H&P (Signed)
PATIENT NAME:  Stanley [name], Stanley [name] MR#:  [id number] DATE OF BIRTH:  25-Jan-1961  DATE OF ADMISSION:  01/04/2013  PRIMARY CARE PHYSICIAN: Likely Dr. Caryn Section, but the patient is not sure about that.   HISTORY OF PRESENT ILLNESS: The patient is a 54 year old African American male with past medical history significant for history of coronary artery disease, hypertension, hyperlipidemia, end-stage renal disease on hemodialysis for the past 6 years on Tuesdays, Thursdays and Saturdays, who presented to the hospital with complaints of feeling weak for the past 1 week. According to the patient,  he was doing fine up until approximately a week ago when he started having generalized weakness. Over   the past 2 days, he has been having also chest pains. It started yesterday after hemodialysis. He has been more sleepy and tired and admits of shortness of breath. His chest pain is described as sharp, intermittent pain in the lower sternal area with no alleviating or aggravating factors, with no radiation, accompanied by shortness of breath. Because of this discomfort, he decided to come to Emergency Room for further evaluation. In the Emergency Room, his EKG was found to be about the same as it was in the past and his cardiac enzymes were unremarkable. Hospitalist services were contacted for admission, as the patient's last stress test was done in May 2013.  At that time, it was  unremarkable.  PAST MEDICAL HISTORY: Significant for history of coronary artery disease, status post myocardial infarction, history of ischemic cardiomyopathy with ejection fraction of 44%, status post cardiac catheterization in August 2012 by Dr. Clayborn Bigness, history of hypertension, hyperlipidemia, diabetes mellitus, end-stage renal disease on hemodialysis for the past 6 years on Tuesdays, Thursdays and Saturdays,  DVT, history of peripheral artery disease, history of right upper extremity fistulogram in April 2014 by Dr. Lucky Cowboy which showed no stenosis.    MEDICATIONS: Ambien 10 mg p.o. at bedtime, amlodipine 10 mg p.o. daily, aspirin 81 mg p.o. daily, benazepril 40 mg p.o. daily, calcium acetate 667 mg 2 capsules 3 times daily, isosorbide mononitrate 60 mg p.o. daily, labetalol 200 mg p.o. twice daily, Maalox unknown dose every 6 hours as needed, omeprazole 10 mg p.o. daily, simvastatin 40 mg p.o. at bedtime.   PAST SURGICAL HISTORY: Coronary artery bypass grafting in  2010 in [place], right lower extremity angiogram and PTA/stent in right superficial femoral artery in December 2012, right lower extremity PTCA/stent in right external iliac artery disease, PTCA in left common iliac artery in June 2012, PTCA/stent in left external iliac artery in December 2011, PTCA of profunda femoris and common femoral arteries in December 2011.  FAMILY HISTORY: Colon cancer as well as breast cancer.   SOCIAL HISTORY: The patient is on disability, receives Medicaid. Smokes 1 pack per day since approximately age of 85 for 40 years. Drinks at least 40 ounces of beer every other day. In the past, he would drink case of beer a day. However, now it seems to be less since he is not getting his check yet for disability. History of prior crack  cocaine use. Reports that he quit in 2013.   REVIEW OF SYSTEMS: Positive for fatigue and weakness for the past 1 week pains in his chest for the past 2 days. Admits of having slightly elevated weight, 94 pounds after hemodialysis. His dry weight in the past was 91. He admits of having some left foot pain, which seems to be chronic. Some cough as well as wheezes, especially whenever he walks or lies down  flat. Admits of having shortness of breath. Admits of having some yellow phlegm for the past few months. Also chest pains for the past 2 days. He admits of missing hemodialysis intermittently and feeling presyncopal earlier today. Admits of making some urine.  CONSTITUTIONAL: Denies any fevers, chills, fatigue, weakness. Except as  mentioned above, he denies any weight loss or gain.  EYES: Denies any blurry vision, double vision, glaucoma or cataracts.  EARS, NOSE, THROAT: Denies any tinnitus, allergies, epistaxis, sinus pain, dentures, difficulty swallowing. RESPIRATORY: Denies any hemoptysis, asthma. CARDIOVASCULAR: Denies any orthopnea, edema, arrhythmias, palpitations. GASTROINTESTINAL: Denies any nausea, vomiting, diarrhea, rectal bleeding, change in bowel habits.  GENITOURINARY: Denies dysuria, hematuria, frequency or incontinence. ENDOCRINE: Denies any polydipsia, nocturia, thyroid problems, heat or cold intolerance or thirst. HEMATOLOGIC: Denies anemia, easy bruising, bleeding, swollen glands. SKIN: Denies acne, rashes, lesions or change in moles.  MUSCULOSKELETAL: Denies arthritis, cramps, swelling.  NEUROLOGICAL: No numbness, epilepsy or tremors. PSYCHIATRIC: Denies anxiety, insomnia or depression.   PHYSICAL EXAMINATION:  VITAL SIGNS: On arrival to the hospital, the patient's temperature was 97.7, pulse rate 74, respiratory rate 18, blood pressure 89/60. Saturation was 97% on room air. GENERAL: This is a well-developed, well-nourished, African American male in no significant distress, somewhat somnolent, lying on the stretcher. HEENT: His pupils are equal and reactive to light. Extraocular movements intact. No icterus or conjunctivitis. Has normal hearing. No pharyngeal erythema. Mucosa is moist.  NECK: No masses. Supple, nontender. Thyroid is not enlarged. No adenopathy. No JVD or carotid bruits bilaterally. Full range of motion.  LUNGS: Crackles at bases. Otherwise, a few rhonchi were heard. No rales. Somewhat diminished breath sounds were noted. No wheezing, labored inspirations or increased effort to breathe. No dullness to percussion. Not in overt respiratory distress.  CARDIOVASCULAR: S1, S2 appreciated. No murmurs, gallops or rubs noted. Rhythm was regular. PMI not lateralized. Chest is nontender to  palpation.  EXTREMITIES: Diminished pedal pulses, however, palpable dorsalis pedis pulses bilaterally. No calf tenderness or cyanosis was noted.  ABDOMEN: Soft, nontender. Bowel sounds are present. No hepatosplenomegaly or masses were noted.  RECTAL: Deferred.  MUSCULOSKELETAL: Muscle strength: Able to move all extremities. No cyanosis, degenerative joint disease or kyphosis. Gait is not tested.  SKIN: Did not reveal any rashes, lesions, erythema, nodularity or induration. It was warm and dry to palpation.  LYMPH: No adenopathy in the cervical region.  NEUROLOGICAL: Cranial nerves grossly intact. Sensory is intact. No dysarthria or aphasia.  PSYCHIATRIC: The patient is somnolent. However, whenever he is woken up, he is able to converse. He is oriented to time, person and place, cooperative. Memory is good. No significant confusion, agitation or depression noted.   LABORATORY, DIAGNOSTIC AND RADIOLOGICAL DATA: BUN and creatinine were 26 and 5.23, glucose 122. Otherwise, BMP was unremarkable. Estimated GFR for African American would be 13. The patient's magnesium level was 1.7. Lipase 126. Liver enzymes were unremarkable. Cardiac enzymes were unremarkable. The patient's white blood cell count was  normal at 8.1, hemoglobin 11.9, platelet count 215. EKG showed normal sinus rhythm at 73 beats per minute, normal axis, nonspecific T wave abnormalities, T depressions in inferior  as well as lateral leads, which are chronic. Chest x-ray was unremarkable.   ASSESSMENT AND PLAN: 1.  Chest pain: Admit patient to medical floor. Get cardiac enzymes x 3. Will continue labetalol  as well as aspirin, provided patient's blood pressure is satisfactory. Will continue heparin subcutaneously. Will check cardiac enzymes x 3. 2.  (Dictation Anomaly) Relative hypotension, we  will hold some of the blood pressure medications. 3.  Generalized weakness, very likely  due to hypotension. We will be holding blood pressure  medications unless his blood pressure bounces back. Questionable also due to missing hemodialysis. Will get nephrology involved and will continue hemodialysis according to nephrologist's recommendations. 4.  Diabetes mellitus: Will continue hemodialysis, diet and sliding scale insulin.  5.  Hyperlipidemia: Continue simvastatin. Check lipid panel.  6.  Tobacco abuse: Nicotine replacement therapy is initiated. Discussed cessation for 3 minutes.   TIME SPENT: One hour.  ____________________________ Theodoro Grist, MD rv:jm D: 01/04/2013 16:50:40 ET T: 01/04/2013 17:27:56 ET JOB#: [id number]  cc: Theodoro Grist, MD, <Dictator> Briant Angelillo MD ELECTRONICALLY SIGNED 01/30/2013 20:46

## 2014-07-04 NOTE — Op Note (Signed)
PATIENT NAME:  Stanley [name], Stanley [name] MR#:  [id number] DATE OF BIRTH:  1960/10/12  DATE OF PROCEDURE:  06/25/2012  PREOPERATIVE DIAGNOSES:  1. End-stage renal disease.  2. Difficulties with function and flow of right arm arteriovenous fistula.  3. Leg pain.  4. Hyperlipidemia.  5. Hypertension.   POSTOPERATIVE DIAGNOSES:  1. End-stage renal disease.  2. Difficulties with function and flow of right arm arteriovenous fistula.  3. Leg pain.  4. Hyperlipidemia.  5. Hypertension.   PROCEDURE:  1. Ultrasound guidance for vascular access, right arm AV fistula.  2. Right upper extremity fistulogram and central venogram.   SURGEON: Algernon Huxley, MD   ANESTHESIA: Local with moderate conscious sedation.   ESTIMATED BLOOD LOSS: Minimal.   INDICATION FOR PROCEDURE: A 54 year old [place] American male with end-stage renal disease. We were asked to evaluate his AV fistula by his dialysis access center. The patient is brought to the angiography suite for fistulogram.   DESCRIPTION OF THE PROCEDURE: The patient was brought to the vascular interventional radiology suite. The right upper extremity was sterilely prepped and draped, and a sterile surgical field was created. Using ultrasound guidance, I accessed the fistula in a retrograde fashion just beyond the location of the previously placed stents in the proximal portion of the AV fistula not far from the anastomosis. A micropuncture wire and sheath were placed, and this was placed in a retrograde direction. I then imaged. There was brisk flow and a Kumpe catheter had to be placed to the area of the anastomosis to image the perianastomotic region. The remainder could be imaged more centrally without difficulty. This demonstrated a patent fistula. The stents were patent. There was no significant stenosis in the stents and no significant stenosis in the central venous circulation or the proximal upper arm. At this point, a 4-0 Monocryl purse suture placed around  the catheter and the catheter and sheath were removed. Pressure was held. Sterile dressing was placed. The patient tolerated the procedure well and was taken to the recovery room in stable condition.     ____________________________ Algernon Huxley, MD jsd:es D: 06/25/2012 13:39:46 ET T: 06/25/2012 14:05:07 ET JOB#: [id number]  cc: Algernon Huxley, MD, <Dictator> Algernon Huxley MD ELECTRONICALLY SIGNED 06/28/2012 15:26

## 2014-07-04 NOTE — Discharge Summary (Signed)
PATIENT NAME:  Stanley [name], Stanley [name] MR#:  [id number] DATE OF BIRTH:  1960-04-17  DATE OF ADMISSION:  01/04/2013 DATE OF DISCHARGE:  01/06/2013  DIAGNOSES AT TIME OF DISCHARGE: 1.  Chest pain, myocardial infarction ruled out, Lexiscan Myoview scan with low risk for ischemic heart disease.  2.  End-stage renal disease.  3.  Generalized weakness with hypotension.  4.  Elevated sugar.  5.  Hyperlipidemia.  6.  End-stage renal disease on dialysis,   CHIEF COMPLAINT: Weakness chest pains.   HISTORY OF PRESENT ILLNESS: Stanley [name] is a 54 year old African American male with past medical history significant for coronary artery disease, hypertension, hyperlipidemia, end-stage renal disease on hemodialysis presented to the hospital complaining of generalized weakness for one week. The patient states that for the last two days, he has also been having chest pains and feeling more sleepy and tired, and described the pain as sharp and intermittent in the lower sternal area with no radiation. The patient's cardiac enzymes were negative. He underwent a Myoview Lexiscan, which was essentially felt to be low risk.   PAST MEDICAL HISTORY:  1.  Coronary artery disease status post previous myocardial infarction.  2.  History of ischemic cardiomyopathy, ejection fraction 44%.  3.  Cardiac catheterization in August 2012.   4.  History of hypertension, hyperlipidemia, type 2 diabetes end-stage renal disease, peripheral vascular disease.  5.  History of right upper extremity fistulogram.   PHYSICAL EXAMINATION: VITAL SIGNS: Temperature 97.7, pulse 74, respirations 18, blood pressure 89/60, oxygen saturation 97% on room air.  GENERAL: He was a well-nourished [place] American male not in distress, somewhat somnolent.   HEENT: NCAT.  NECK: Supple. No JVD.  HEART: S1, S2.  LUNGS: Clear to auscultation.  ABDOMEN: Soft, nontender.  EXTREMITIES: No edema.   LABORATORY, DIAGNOSTIC AND RADIOLOGIC DATA: BUN 26,  creatinine 5.23, glucose 122, estimated GFR 13, magnesium 1.7, lipase 126. Liver enzymes were unremarkable. WBC count was 8.1, hemoglobin 11.9, platelets 215.   EKG showed sinus rhythm, heart rate of 73, left axis deviation and nonspecific ST-T changes.   Chest x-ray was unremarkable.   HOSPITAL COURSE: The patient was admitted to [facility] and underwent a Myoview Lexiscan test, which was read as low risk scan ejection fraction of 55% left ventricular global function was mildly reduced. There were no EKG changes concerning for ischemia and no artifact was noted.  During his stay in the hospital, the patient continued to feel better. His blood pressure gradually improved and came up to 124/74. His appetite also improved. He underwent dialysis and was seen in consultation by Dr. Juleen China. The patient's blood sugar was slightly elevated and A1c was ordered. Results were still pending. TSH was also moderately suppressed at 0.251. The patient's blood pressure medications were held when he was in the hospital, restart at outpatient at lower dosages. He was strongly advised to quit smoking completely and to follow up with Dr. Ola Spurr, who is going to be his primary care physician, and also follow up with Dr. Murlean Iba, nephrologist. The patient discharged home on the following medications:  Amlodipine 5 mg once a day, benazepril 10 mg once a day, nicotine patches 21 mg a day for two weeks, decrease to 14 mg a day for two weeks down to 7 mg a day for two weeks and then stop.  Calcium acetate 667 mg 2 capsules t.i.d., gabapentin 300 mg once a day at bedtime, isosorbide mononitrate 60 mg once a day, omeprazole 20 mg once a day and  azithromycin 250 mg daily for three more days.   DISCHARGE PLAN: The patient has been advised to follow a strict low-carb diet. Follow-up Dr. Caryn Section and also follow up with Dr. [name] and follow up with his cardiologist, Dr. Clayborn Bigness. The patient has been advised to call back if there are  any questions or concerns.   TOTAL TIME SPENT IN DISCHARGING THIS PATIENT: 35 minutes.  ____________________________ Tracie Harrier, MD vh:cc D: 01/06/2013 12:01:58 ET T: 01/06/2013 22:26:32 ET JOB#: [id number]  cc: Tracie Harrier, MD, <Dictator> Tracie Harrier MD ELECTRONICALLY SIGNED 01/18/2013 13:04

## 2014-07-04 NOTE — Op Note (Signed)
PATIENT NAME:  Stanley [name], Stanley [name] MR#:  [id number] DATE OF BIRTH:  01-09-1961  DATE OF PROCEDURE:  03/16/2012  PREOPERATIVE DIAGNOSES:  1. End-stage renal disease. 2. Hypertension.  3. Poorly functioning right arm arteriovenous graft.  POSTOPERATIVE DIAGNOSES:  1. End-stage renal disease. 2. Hypertension.  3. Poorly functioning right arm arteriovenous graft.  PROCEDURES:  1. Ultrasound guidance for vascular access to right arm arteriovenous graft. 2. Right upper extremity shuntogram and central venogram.  3. Percutaneous transluminal angioplasty of mid-graft recurrent stenosis with 8 mm diameter angioplasty balloon.   SURGEON: Leotis Pain, M.D.   ANESTHESIA: Local with moderate conscious sedation.   ESTIMATED BLOOD LOSS: Approximately 25 mL.  FLUOROSCOPY TIME: Less than 2 minutes.   CONTRAST USED: 25 mL.   INDICATION FOR PROCEDURE: A 54 year old male with end-stage renal disease whose dialysis graft has had recurring problems. He has had previous interventions performed in [place]. He presents here for further evaluation. Shuntogram is recommended for diminished flows on dialysis as reported by his dialysis access center.   DESCRIPTION OF THE PROCEDURE: The patient was brought to the vascular interventional radiology suite. Right upper extremity was sterilely prepped and draped, and a sterile surgical field was created. Ultrasound was used to visualize the graft and access this in the proximal portion with a micropuncture needle. Micropuncture wire and sheath were placed. Imaging performed through this showed moderate recurrent stenosis in the 60 to 70% range in the mid-portion of the graft within a previously placed covered stent. The remainder of the graft and central venous circulation was patent. Compression allowed opacification of the arterial anastomosis and the brachial artery. The patient was heparinized, upsized to a 6-French sheath, and treated this area with an 8 mm  diameter angioplasty balloon. A tight waist was taken which resolved at approximately 12 atmospheres and completion angiogram following this showed a patent graft with less than 20% residual stenosis. At this point, I elected to terminate to the procedure. The sheath was removed around a 4-0 Monocryl pursestring suture. Pressure was held. Sterile dressing was placed. The patient tolerated the procedure well and was taken to the recovery room in stable condition.     ____________________________ Stanley Huxley, MD jsd:es D: 03/16/2012 09:13:47 ET T: 03/16/2012 09:47:31 ET JOB#: [id number]  cc: Stanley Huxley, MD, <Dictator> Stanley Huxley MD ELECTRONICALLY SIGNED 03/19/2012 14:14

## 2014-07-06 NOTE — H&P (Signed)
PATIENT NAME:  Stanley [name], Stanley [name] MR#:  [id number] DATE OF BIRTH:  15-Oct-1960  DATE OF ADMISSION:  06/18/2011  PRIMARY CARE PHYSICIAN: Andrey Farmer, MD  CHIEF COMPLAINT: Chest pain.   HISTORY OF PRESENT ILLNESS: [name] is a 54 year old white male with a history of coronary artery disease, end-stage renal disease on dialysis for the last four years, peripheral vascular disease with multiple, multiple interventions on his lower extremities over the last several years, ongoing tobacco use, and recent cessation of use of crack cocaine use who presents to the ED with four day history of pleuritic chest pain which he states is similar to the pain that he had during his last admission for chest pain which necessitated cardiac catheterization. He denies nausea, but has had some diuresis. He denies shortness of breath. He has history of reflux and states that this is not at all like his reflux pain. He did receive hemodialysis today in [place]. He states that his last cocaine use was five months ago, and he was a smoker of crack. He continues to use tobacco on a daily basis and has not tried to quit because Medicaid will not pay for his Chantix. His primary care physician is Dr. Andrey Farmer. His nephrologist is Dr. [name]. He has seen Dr. Clayborn Bigness, cardiologist, in the past in the hospital but has not followed up with him as an outpatient and has missed several appointments.   PAST MEDICAL HISTORY:  1. End-stage kidney disease on hemodialysis the last four years secondary to hypertensive nephropathy.  2. Gastroesophageal reflux disease with no prior endoscopy done.  3. Hypertension.  4. Peripheral vascular disease with multiple interventions to both lower extremities.  5. Ongoing tobacco abuse.  CURRENT MEDICATIONS: 1. Ambien 10 mg at bedtime, Which he says no longer works. 2. Aspirin 81 mg daily.  3. Benazepril 40 mg daily.  4. Labetalol 200 mg twice daily.  5. PhosLo 3 tablets with meals and 3 with  snacks. 6. Amlodipine 10 mg daily.  7. Atorvastatin 40 mg daily.  8. Isosorbide mononitrate 60 mg daily.  9. Prilosec 40 mg daily. 10. Maalox every six hours as needed for heartburn.   PAST SURGICAL HISTORY:  1. He had several vessel CABG in 2010, done in [place].  2. Right lower extremity angiogram and PTCA/stent of the right superficial femoral artery, in December 2012. 3. Right lower extremity PTCA stent of right external iliac artery. 4. PTCA of left common iliac artery, June 2012.  5. PTCA/stent of left external iliac artery, December 2011. 6. PTCA of profunda femoris and common femoral arteries, December 2011.   LAST HOSPITALIZATION: August 2012 for chest pain at which time he underwent a cardiac catheterization which showed no progression of disease. The patient left AGAINST MEDICAL ADVICE during that hospitalization.   FAMILY HISTORY: Positive for colon cancer and breast cancer.   SOCIAL HISTORY: The patient is on disability and receives Medicaid. He smokes about a pack a day. He has no prior attempt at quitting. He has never tried the NicoDerm patches outside of the hospital, but states that during hospitalization they worked very well for him. He drinks a 40 ounce beer every other day. He has a history of prior crack cocaine use having quit reportedly five months ago.   REVIEW OF SYSTEMS: Negative for fever, fatigue, and weakness. He has no recent changes in vision. He denies seasonal rhinitis, tinnitus, or ear pain. He denies any cough, wheezing, hemoptysis, or dyspnea. He report painful respirations and  pleuritic chest pain. He denies edema or arrhythmia or dyspnea on exertion. He has no history of nausea, vomiting, diarrhea, or abdominal pain. He denies hematuria or dysuria. He has no history of polyuria or nocturia. He has no history of anemia, easy bruising, or bleeding. He denies chronic pain, except in the left foot secondary to ischemic changes. This is chronic and at  baseline. He denies any numbness, weakness, dysarthria, or history of strokes or transient ischemic attacks. He does have insomnia which was previously treated with Ambien, which is now tolerant of.      PHYSICAL EXAMINATION:   GENERAL: This is a well-nourished, middle-aged male who appears to be in no apparent distress, watching television in bed.  VITAL SIGNS: Blood pressure 98/70, pulse is 94, rhythm is regular, saturations are 97% on room air, temperature 98.9, and respirations are 12.   HEENT: Pupils are equal, round, and reactive to light. Extraocular movements are intact.   NECK: Supple without lymphadenopathy, JVD, or thyromegaly.   LUNGS: Clear to auscultation bilaterally. No wheezing or rhonchi.   CARDIOVASCULAR: Regular rate and rhythm with no murmurs, rubs, or gallops. He has a well-healed midline surgical scar from his CABG. He is nontender to palpation.   ABDOMEN: Soft, nontender, and nondistended with good bowel sounds and no evidence of hepatosplenomegaly. He has good strength in all four extremities.   SKIN: Warm and dry with no rashes or lesions. He has no cervical or axillary lymphadenopathy.   NEURO: He is nonfocal with intact sensation and reflexes. He is alert and oriented to person, place, and time.   ADMISSION LABORATORY DATA: Sodium 138, potassium 4.1, chloride 99, bicarbonate 27, BUN 16, creatinine 4.74, and glucose 81. White count 9.9, hemoglobin 14.8, and platelets 228. Liver function tests are normal. CK is 106, MB is 1.1, an troponin I is 0.02.  A 12-lead EKG shows normal sinus rhythm with an incomplete left bundle branch block and T wave inversions in V5 and V6. These T wave inversions were seen during August 2012 admission.   PA and lateral chest x-ray shows stable heart and mediastinum with prior median sternotomy and CABG changes and no focal pulmonary opacities.   ASSESSMENT AND PLAN:  1. Pleuritic chest pain: Given the chronicity of his pain and  negative enzymes this is unlikely to be cardiac, however, the patient refuses to go home and does have a history of coronary artery disease. He is a dialysis patient, so getting a contrasted CT scan to rule out PE will need to be coordinated with dialysis. I will defer this to his regular doctor in the morning. I am going to try to treat him tonight for esophagitis with a GI cocktail and continue to rule him out with serial cardiac enzymes. I will also try to obtain a urine drug screen as if he has recently smoked crack cocaine this could be the cause of his recurrent chest pain.  2. Hypertensive nephropathy: We will continue Benazepril, labetalol, amlodipine, and isosorbide mononitrate for blood pressure.  3. Chronic left extremity pain secondary to peripheral vascular disease: The patient does not appear to be taking any narcotics for management of his pain. He is not requesting any either. He is scheduled to follow-up with Dr. Hortencia Pilar in several weeks for his six month follow-up.  4. Tobacco abuse: The appears to be wanting to quit, but sites reasons including failure of Medicaid to pay for his Chantix as the biggest deterrent to him quitting. I have suggested  that he talk with his primary care physician to see if prior authorization is needed.  ESTIMATED TIME OF CARE: 60 minutes. ____________________________ Deborra Medina, MD tt:slb D: 06/18/2011 17:28:40 ET T: 06/19/2011 08:30:24 ET JOB#: [id number]  cc: Deborra Medina, MD, <Dictator> Dianah Field. Mable Fill, MD Deborra Medina MD ELECTRONICALLY SIGNED 07/21/2011 18:46

## 2014-07-07 LAB — SURGICAL PATHOLOGY

## 2014-07-08 LAB — BODY FLUID CULTURE

## 2014-07-14 LAB — CYTOLOGY - NON PAP

## 2014-07-18 ENCOUNTER — Emergency Department
Admission: EM | Admit: 2014-07-18 | Discharge: 2014-07-18 | Disposition: A | Discharge status: Home / Self Care | Payer: Medicare Other | Attending: Emergency Medicine | Admitting: Emergency Medicine

## 2014-07-18 DIAGNOSIS — R188 Other ascites: Secondary | ICD-10-CM | POA: Diagnosis present

## 2014-07-18 LAB — CBC WITH DIFFERENTIAL/PLATELET
BASOS ABS: 0.1 10*3/uL (ref 0–0.1)
Basophils Relative: 1 %
EOS ABS: 0 10*3/uL (ref 0–0.7)
Eosinophils Relative: 1 %
HCT: 37.6 % — ABNORMAL LOW (ref 40.0–52.0)
Hemoglobin: 12.1 g/dL — ABNORMAL LOW (ref 13.0–18.0)
Lymphocytes Relative: 16 %
Lymphs Abs: 1 10*3/uL (ref 1.0–3.6)
MCH: 31.3 pg (ref 26.0–34.0)
MCHC: 32.3 g/dL (ref 32.0–36.0)
MCV: 97 fL (ref 80.0–100.0)
Monocytes Absolute: 0.7 10*3/uL (ref 0.2–1.0)
Monocytes Relative: 12 %
Neutro Abs: 4.5 10*3/uL (ref 1.4–6.5)
Neutrophils Relative %: 70 %
PLATELETS: 176 10*3/uL (ref 150–440)
RBC: 3.87 MIL/uL — ABNORMAL LOW (ref 4.40–5.90)
RDW: 24.6 % — AB (ref 11.5–14.5)
WBC: 6.3 10*3/uL (ref 3.8–10.6)

## 2014-07-18 LAB — BASIC METABOLIC PANEL
ANION GAP: 13 (ref 5–15)
BUN: 31 mg/dL — AB (ref 6–20)
CALCIUM: 8.9 mg/dL (ref 8.9–10.3)
CHLORIDE: 99 mmol/L — AB (ref 101–111)
CO2: 25 mmol/L (ref 22–32)
Creatinine, Ser: 6.82 mg/dL — ABNORMAL HIGH (ref 0.61–1.24)
GFR, EST AFRICAN AMERICAN: 9 mL/min — AB (ref >60)
GFR, EST NON AFRICAN AMERICAN: 8 mL/min — AB (ref >60)
Glucose, Bld: 105 mg/dL — ABNORMAL HIGH (ref 65–99)
Potassium: 4 mmol/L (ref 3.5–5.1)
Sodium: 137 mmol/L (ref 135–145)

## 2014-07-18 LAB — HEPATIC FUNCTION PANEL
ALBUMIN: 3.3 g/dL — AB (ref 3.5–5.0)
ALT: 31 U/L (ref 17–63)
AST: 35 U/L (ref 15–41)
Alkaline Phosphatase: 208 U/L — ABNORMAL HIGH (ref 38–126)
BILIRUBIN INDIRECT: 0.5 mg/dL (ref 0.3–0.9)
BILIRUBIN TOTAL: 0.9 mg/dL (ref 0.3–1.2)
Bilirubin, Direct: 0.4 mg/dL (ref 0.1–0.5)
Total Protein: 7.5 g/dL (ref 6.5–8.1)

## 2014-07-18 LAB — PROTIME-INR
INR: 1.09
Prothrombin Time: 14.3 seconds (ref 11.4–15.0)

## 2014-07-18 NOTE — ED Notes (Signed)
Pt is a dialysis pt, pt reports increased in fluid in stomach and face, pt had fluid drained from abdomen 07/01/14, pt last dialysis was thursday

## 2014-07-19 ENCOUNTER — Encounter: Payer: Self-pay | Admitting: Emergency Medicine

## 2014-07-19 ENCOUNTER — Emergency Department: Payer: Medicare Other

## 2014-07-19 ENCOUNTER — Other Ambulatory Visit: Payer: Self-pay

## 2014-07-19 ENCOUNTER — Emergency Department
Admission: EM | Admit: 2014-07-19 | Discharge: 2014-07-19 | Disposition: A | Discharge status: Home / Self Care | Payer: Medicare Other | Attending: Emergency Medicine | Admitting: Emergency Medicine

## 2014-07-19 DIAGNOSIS — I120 Hypertensive chronic kidney disease with stage 5 chronic kidney disease or end stage renal disease: Secondary | ICD-10-CM | POA: Insufficient documentation

## 2014-07-19 DIAGNOSIS — Z720 Tobacco use: Secondary | ICD-10-CM | POA: Insufficient documentation

## 2014-07-19 DIAGNOSIS — Z7982 Long term (current) use of aspirin: Secondary | ICD-10-CM | POA: Diagnosis not present

## 2014-07-19 DIAGNOSIS — G629 Polyneuropathy, unspecified: Secondary | ICD-10-CM | POA: Diagnosis not present

## 2014-07-19 DIAGNOSIS — Z79899 Other long term (current) drug therapy: Secondary | ICD-10-CM | POA: Insufficient documentation

## 2014-07-19 DIAGNOSIS — R188 Other ascites: Secondary | ICD-10-CM | POA: Diagnosis not present

## 2014-07-19 DIAGNOSIS — Z992 Dependence on renal dialysis: Secondary | ICD-10-CM | POA: Insufficient documentation

## 2014-07-19 DIAGNOSIS — R109 Unspecified abdominal pain: Secondary | ICD-10-CM | POA: Diagnosis present

## 2014-07-19 DIAGNOSIS — N186 End stage renal disease: Secondary | ICD-10-CM | POA: Diagnosis not present

## 2014-07-19 DIAGNOSIS — E1142 Type 2 diabetes mellitus with diabetic polyneuropathy: Secondary | ICD-10-CM | POA: Diagnosis not present

## 2014-07-19 LAB — CBC
HCT: 36.2 % — ABNORMAL LOW (ref 40.0–52.0)
HEMOGLOBIN: 11.9 g/dL — AB (ref 13.0–18.0)
MCH: 31.9 pg (ref 26.0–34.0)
MCHC: 32.9 g/dL (ref 32.0–36.0)
MCV: 97.2 fL (ref 80.0–100.0)
Platelets: 153 10*3/uL (ref 150–440)
RBC: 3.72 MIL/uL — AB (ref 4.40–5.90)
RDW: 24.2 % — ABNORMAL HIGH (ref 11.5–14.5)
WBC: 6.7 10*3/uL (ref 3.8–10.6)

## 2014-07-19 LAB — COMPREHENSIVE METABOLIC PANEL
ALBUMIN: 3.3 g/dL — AB (ref 3.5–5.0)
ALK PHOS: 207 U/L — AB (ref 38–126)
ALT: 30 U/L (ref 17–63)
AST: 31 U/L (ref 15–41)
Anion gap: 12 (ref 5–15)
BUN: 43 mg/dL — ABNORMAL HIGH (ref 6–20)
CHLORIDE: 101 mmol/L (ref 101–111)
CO2: 24 mmol/L (ref 22–32)
CREATININE: 7.5 mg/dL — AB (ref 0.61–1.24)
Calcium: 9 mg/dL (ref 8.9–10.3)
GFR calc Af Amer: 8 mL/min — ABNORMAL LOW (ref >60)
GFR calc non Af Amer: 7 mL/min — ABNORMAL LOW (ref >60)
Glucose, Bld: 95 mg/dL (ref 65–99)
POTASSIUM: 4.2 mmol/L (ref 3.5–5.1)
Sodium: 137 mmol/L (ref 135–145)
TOTAL PROTEIN: 7 g/dL (ref 6.5–8.1)
Total Bilirubin: 2 mg/dL — ABNORMAL HIGH (ref 0.3–1.2)

## 2014-07-19 LAB — APTT: aPTT: 29 seconds (ref 24–36)

## 2014-07-19 LAB — PROTIME-INR
INR: 1.13
Prothrombin Time: 14.7 seconds (ref 11.4–15.0)

## 2014-07-19 LAB — LIPASE, BLOOD: Lipase: 27 U/L (ref 22–51)

## 2014-07-19 NOTE — Discharge Instructions (Signed)
Ascites °Ascites is a gathering of fluid in the belly (abdomen). This is most often caused by liver disease. It may also be caused by a number of other less common problems. It causes a ballooning out (distension) of the abdomen. °CAUSES  °Scarring of the liver (cirrhosis) is the most common cause of ascites. Other causes include: °· Infection or inflammation in the abdomen. °· Cancer in the abdomen. °· Heart failure. °· Certain forms of kidney failure (nephritic syndrome). °· Inflammation of the pancreas. °· Clots in the veins of the liver. °SYMPTOMS  °In the early stages of ascites, you may not have any symptoms. The main symptom of ascites is a sense of abdominal bloating. This is due to the presence of fluid. This may also cause an increase in abdominal or waist size. People with this condition can develop swelling in the legs, and men can develop a swollen scrotum. When there is a lot of fluid, it may be hard to breath. Stretching of the abdomen by fluid can be painful. °DIAGNOSIS  °Certain features of your medical history, such as a history of liver disease and of an enlarging abdomen, can suggest the presence of ascites. The diagnosis of ascites can be made on physical exam by your caregiver. An abdominal ultrasound examination can confirm that ascites is present, and estimate the amount of fluid. °Once ascites is confirmed, it is important to determine its cause. Again, a history of one of the conditions listed in "CAUSES" provides a strong clue. A physical exam is important, and blood and X-ray tests may be needed. During a procedure called paracentesis, a sample of fluid is removed from the abdomen. This can determine certain key features about the fluid, such as whether or not infection or cancer is present. Your caregiver will determine if a paracentesis is necessary. They will describe the procedure to you. °PREVENTION  °Ascites is a complication of other conditions. Therefore to prevent ascites, you  must seek treatment for any significant health conditions you have. Once ascites is present, careful attention to fluid and salt intake may help prevent it from getting worse. If you have ascites, you should not drink alcohol. °PROGNOSIS  °The prognosis of ascites depends on the underlying disease. If the disease is reversible, such as with certain infections or with heart failure, then ascites may improve or disappear. When ascites is caused by cirrhosis, then it indicates that the liver disease has worsened, and further evaluation and treatment of the liver disease is needed. If your ascites is caused by cancer, then the success or failure of the cancer treatment will determine whether your ascites will improve or worsen. °RISKS AND COMPLICATIONS  °Ascites is likely to worsen if it is not properly diagnosed and treated. A large amount of ascites can cause pain and difficulty breathing. The main complication, besides worsening, is infection (called spontaneous bacterial peritonitis). This requires prompt treatment. °TREATMENT  °The treatment of ascites depends on its cause. When liver disease is your cause, medical management using water pills (diuretics) and decreasing salt intake is often effective. Ascites due to peritoneal inflammation or malignancy (cancer) alone does not respond to salt restriction and diuretics. Hospitalization is sometimes required. °If the treatment of ascites cannot be managed with medications, a number of other treatments are available. Your caregivers will help you decide which will work best for you. Some of these are: °· Removal of fluid from the abdomen (paracentesis). °· Fluid from the abdomen is passed into a vein (peritoneovenous shunting). °·   Liver transplantation. °· Transjugular intrahepatic portosystemic stent shunt. °HOME CARE INSTRUCTIONS  °It is important to monitor body weight and the intake and output of fluids. Weigh yourself at the same time every day. Record your  weights. Fluid restriction may be necessary. It is also important to know your salt intake. The more salt you take in, the more fluid you will retain. Ninety percent of people with ascites respond to this approach. °· Follow any directions for medicines carefully. °· Follow up with your caregiver, as directed. °· Report any changes in your health, especially any new or worsening symptoms. °· If your ascites is from liver disease, avoid alcohol and other substances toxic to the liver. °SEEK MEDICAL CARE IF:  °· Your weight increases more than a few pounds in a few days. °· Your abdominal or waist size increases. °· You develop swelling in your legs. °· You had swelling and it worsens. °SEEK IMMEDIATE MEDICAL CARE IF:  °· You develop a fever. °· You develop new abdominal pain. °· You develop difficulty breathing. °· You develop confusion. °· You have bleeding from the mouth, stomach, or rectum. °MAKE SURE YOU:  °· Understand these instructions. °· Will watch your condition. °· Will get help right away if you are not doing well or get worse. °Document Released: 02/28/2005 Document Revised: 05/23/2011 Document Reviewed: 09/29/2006 °ExitCare® Patient Information ©2015 [employer]. This information is not intended to replace advice given to you by your health care provider. Make sure you discuss any questions you have with your health care provider. ° °

## 2014-07-19 NOTE — ED Notes (Signed)
Pt to US for procedure. 

## 2014-07-19 NOTE — ED Notes (Signed)
Patient to ED with c.o abdominal swelling. Patient states fluid was drained from abdomen 2-3 weeks ago. Patient states fluid has contnued to build since. Patient is on dialysis. Patient goes to dialysis Tuesday, Thursday, and Saturday.

## 2014-07-19 NOTE — ED Provider Notes (Signed)
[facility] Emergency Department Provider Note  ____________________________________________  Time seen: 8:30 AM  I have reviewed the triage vital signs and the nursing notes.   HISTORY  Chief Complaint Abdominal Pain      HPI Rayland Gowda is a 54 y.o. male presents with abdominal distention. He reports a history of cirrhosis and has had abdominal swelling for some time. He had ascites drained on 07/01/2014 and reports 14 "pounds" removed. He reports that his abdomen is swollen now and is interfering with his breathing. He called his doctor who said to come to the emergency department. He denies fevers chills. He does not have pain in his abdomen rather stretching and tightness and fullness.    Past Medical History  Diagnosis Date  . Coronary artery disease   . Hypertension   . GERD (gastroesophageal reflux disease)   . Suicidal ideation     & HOMICIDAL IDEATION --  06-16-2013   ADMITTED TO BEHAVIOR HEALTH  . Alcohol abuse   . Drug abuse   . Diabetic peripheral neuropathy   . End stage renal disease on dialysis NEPHROLOGIST-   [facility]  [place]    HEMODIALYSIS --   TUES/  THURS/  SAT    Patient Active Problem List   Diagnosis Date Noted  . Homicidal ideation 06/19/2013  . Suicidal intent 06/19/2013  . Homicidal ideations 06/19/2013  . Hyperkalemia 06/16/2013  . Mandible fracture 06/05/2013  . Fracture, mandible 06/02/2013  . Coronary atherosclerosis of native coronary artery 06/02/2013  . ESRD on dialysis 06/02/2013  . Mandible open fracture 06/02/2013    Past Surgical History  Procedure Laterality Date  . Dialysis fistula creation  LAST SURGERY  APPOX  2008  . Orif mandibular fracture N/A 06/05/2013    Procedure: REPAIR OF MANDIBULAR FRACTURE x 2 with maxillo-mandibular fixation ;  Surgeon: Theodoro Kos, DO;  Location: [facility];  Service: Plastics;  Laterality: N/A;  . Coronary artery bypass graft  2008  ([place] , [place])    3  VESSEL  . Coronary angioplasty  ?    PT UNABLE TO TELL IF  BEFORE OR AFTER  CABG  . Mandibular hardware removal N/A 07/29/2013    Procedure: REMOVAL OF ARCH BARS;  Surgeon: Theodoro Kos, DO;  Location: [facility];  Service: Plastics;  Laterality: N/A;    Current Outpatient Rx  Name  Route  Sig  Dispense  Refill  . amLODipine (NORVASC) 5 MG tablet   Oral   Take 5 mg by mouth daily.          Marland Kitchen aspirin EC 81 MG tablet   Oral   Take 81 mg by mouth daily.         Marland Kitchen atorvastatin (LIPITOR) 40 MG tablet   Oral   Take 40 mg by mouth daily.         . benazepril (LOTENSIN) 10 MG tablet   Oral   Take 10 mg by mouth daily.         . calcium acetate (PHOSLO) 667 MG capsule   Oral   Take 2,001 mg by mouth 3 (three) times daily with meals.         . gabapentin (NEURONTIN) 300 MG capsule   Oral   Take 300 mg by mouth daily.          Marland Kitchen HYDROcodone-acetaminophen (NORCO/VICODIN) 5-325 MG per tablet   Oral   Take 1 tablet by mouth every 6 (six) hours as needed  for severe pain.   6 tablet   0   . labetalol (NORMODYNE) 200 MG tablet   Oral   Take 200 mg by mouth daily.          . Multiple Vitamins-Minerals (MULTIVITAMIN WITH MINERALS) tablet   Oral   Take 1 tablet by mouth daily.         Marland Kitchen omeprazole (PRILOSEC) 20 MG capsule   Oral   Take 20 mg by mouth every morning.            Allergies Review of patient's allergies indicates no known allergies.  Family History  Problem Relation Age of Onset  . Family history unknown: Yes    Social History History  Substance Use Topics  . Smoking status: Current Every Day Smoker -- 0.50 packs/day for 40 years    Types: Cigarettes  . Smokeless tobacco: Never Used  . Alcohol Use: No    Review of Systems  Constitutional: Negative for fever. Eyes: Negative for visual changes. ENT: Negative for sore throat. Cardiovascular: Negative for chest pain. Respiratory: Negative for shortness of  breath. Gastrointestinal: Negative for abdominal pain, vomiting and diarrhea. Genitourinary: Negative for dysuria. Musculoskeletal: Negative for back pain. Skin: Negative for rash. Neurological: Negative for headaches, focal weakness or numbness. Psychiatric: No anxiety or depression  10-point ROS otherwise negative.  ____________________________________________   PHYSICAL EXAM:  VITAL SIGNS: ED Triage Vitals  Enc Vitals Group     BP 07/19/14 0804 163/118 mmHg     Pulse Rate 07/19/14 0804 101     Resp --      Temp 07/19/14 0804 97.9 F (36.6 C)     Temp Source 07/19/14 0804 Oral     SpO2 07/19/14 0804 100 %     Weight 07/19/14 0804 183 lb (83.008 kg)     Height 07/19/14 0804 6\' 3"  (1.905 m)     Head Cir --      Peak Flow --      Pain Score 07/19/14 0805 9     Pain Loc --      Pain Edu? --      Excl. in [place]? --      Constitutional: Alert and oriented. Well appearing and in no distress. Eyes: Conjunctivae are normal. PERRL. Normal extraocular movements. ENT   Head: Normocephalic and atraumatic.   Nose: No congestion/rhinnorhea.   Mouth/Throat: Mucous membranes are moist.   Neck: No stridor. Hematological/Lymphatic/Immunilogical: No cervical lymphadenopathy. Cardiovascular: Normal rate, regular rhythm. Normal and symmetric distal pulses are present in all extremities. No murmurs, rubs, or gallops. Respiratory: Normal respiratory effort without tachypnea nor retractions. Breath sounds are clear and equal bilaterally. No wheezes/rales/rhonchi. Gastrointestinal: Patient does have significant distention. No tenderness to palpation however. There is no CVA tenderness. Genitourinary: deferred Musculoskeletal: Nontender with normal range of motion in all extremities. Neurologic:  Normal speech and language. No gross focal neurologic deficits are appreciated. Speech is normal.  Skin:  Skin is warm, dry and intact. No rash noted. Psychiatric: Mood and affect are  normal. Speech and behavior are normal. Patient exhibits appropriate insight and judgment.  ____________________________________________    LABS (pertinent positives/negatives)  Elevated cr, but normal K  ____________________________________________   EKG  ED ECG REPORT   Date: 07/19/2014  EKG Time: 8:02 AM  Rate: 102  Rhythm: nonspecific ST and T waves changes, sinus tachycardia  Axis: Normal  Intervals:none  ST&T Change: Nonspecific   ____________________________________________    RADIOLOGY  None  ____________________________________________   PROCEDURES  Procedure(s) performed: IR performed drainage of ascites  Critical Care performed: None   ____________________________________________   INITIAL IMPRESSION / ASSESSMENT AND PLAN / ED COURSE  Pertinent labs & imaging results that were available during my care of the patient were reviewed by me and considered in my medical decision making (see chart for details).  ----------------------------------------- 8:39 AM on 07/19/2014 -----------------------------------------  Patient with apparent history of cirrhosis and abdominal distention most consistent with ascites. He does say that it seems to be interfering with his breathing. We'll obtain blood work and reevaluate the patient and then consider IR drainage   ----------------------------------------- 9:09 AM on 07/19/2014 -----------------------------------------  Discussed with IR, they will perform abdominal paracentesis. ____________________________________________  ----------------------------------------- 12:58 PM on 07/19/2014 -----------------------------------------  Paracentesis performed. Patient no longer with SOB. Feels well and very anxious to leave he is about to remove his own IV.  FINAL CLINICAL IMPRESSION(S) / ED DIAGNOSES  Final diagnoses:  Ascites     Lavonia Drafts, MD 07/19/14 1304

## 2014-07-22 ENCOUNTER — Emergency Department (HOSPITAL_COMMUNITY)
Admission: EM | Admit: 2014-07-22 | Discharge: 2014-07-23 | Disposition: A | Discharge status: Home / Self Care | Payer: Medicare Other | Attending: Emergency Medicine | Admitting: Emergency Medicine

## 2014-07-22 ENCOUNTER — Encounter (HOSPITAL_COMMUNITY): Payer: Self-pay | Admitting: Emergency Medicine

## 2014-07-22 DIAGNOSIS — Z79899 Other long term (current) drug therapy: Secondary | ICD-10-CM | POA: Diagnosis not present

## 2014-07-22 DIAGNOSIS — Z7982 Long term (current) use of aspirin: Secondary | ICD-10-CM | POA: Diagnosis not present

## 2014-07-22 DIAGNOSIS — Z720 Tobacco use: Secondary | ICD-10-CM | POA: Insufficient documentation

## 2014-07-22 DIAGNOSIS — N186 End stage renal disease: Secondary | ICD-10-CM | POA: Insufficient documentation

## 2014-07-22 DIAGNOSIS — E1140 Type 2 diabetes mellitus with diabetic neuropathy, unspecified: Secondary | ICD-10-CM | POA: Insufficient documentation

## 2014-07-22 DIAGNOSIS — N508 Other specified disorders of male genital organs: Secondary | ICD-10-CM | POA: Insufficient documentation

## 2014-07-22 DIAGNOSIS — R188 Other ascites: Secondary | ICD-10-CM

## 2014-07-22 DIAGNOSIS — I120 Hypertensive chronic kidney disease with stage 5 chronic kidney disease or end stage renal disease: Secondary | ICD-10-CM | POA: Insufficient documentation

## 2014-07-22 DIAGNOSIS — F1020 Alcohol dependence, uncomplicated: Secondary | ICD-10-CM | POA: Diagnosis not present

## 2014-07-22 DIAGNOSIS — G629 Polyneuropathy, unspecified: Secondary | ICD-10-CM | POA: Diagnosis not present

## 2014-07-22 DIAGNOSIS — Z9861 Coronary angioplasty status: Secondary | ICD-10-CM | POA: Diagnosis not present

## 2014-07-22 DIAGNOSIS — N5089 Other specified disorders of the male genital organs: Secondary | ICD-10-CM

## 2014-07-22 DIAGNOSIS — I2510 Atherosclerotic heart disease of native coronary artery without angina pectoris: Secondary | ICD-10-CM | POA: Diagnosis not present

## 2014-07-22 DIAGNOSIS — Z951 Presence of aortocoronary bypass graft: Secondary | ICD-10-CM | POA: Insufficient documentation

## 2014-07-22 DIAGNOSIS — K219 Gastro-esophageal reflux disease without esophagitis: Secondary | ICD-10-CM | POA: Insufficient documentation

## 2014-07-22 LAB — COMPREHENSIVE METABOLIC PANEL
ALK PHOS: 177 U/L — AB (ref 38–126)
ALT: 23 U/L (ref 17–63)
ANION GAP: 12 (ref 5–15)
AST: 23 U/L (ref 15–41)
Albumin: 2.8 g/dL — ABNORMAL LOW (ref 3.5–5.0)
BILIRUBIN TOTAL: 0.9 mg/dL (ref 0.3–1.2)
BUN: 40 mg/dL — AB (ref 6–20)
CHLORIDE: 104 mmol/L (ref 101–111)
CO2: 23 mmol/L (ref 22–32)
Calcium: 8.7 mg/dL — ABNORMAL LOW (ref 8.9–10.3)
Creatinine, Ser: 7.65 mg/dL — ABNORMAL HIGH (ref 0.61–1.24)
GFR calc Af Amer: 8 mL/min — ABNORMAL LOW (ref >60)
GFR, EST NON AFRICAN AMERICAN: 7 mL/min — AB (ref >60)
Glucose, Bld: 108 mg/dL — ABNORMAL HIGH (ref 70–99)
Potassium: 4.8 mmol/L (ref 3.5–5.1)
Sodium: 139 mmol/L (ref 135–145)
Total Protein: 6.4 g/dL — ABNORMAL LOW (ref 6.5–8.1)

## 2014-07-22 LAB — CBC WITH DIFFERENTIAL/PLATELET
Basophils Absolute: 0 10*3/uL (ref 0.0–0.1)
Basophils Relative: 0 % (ref 0–1)
EOS PCT: 1 % (ref 0–5)
Eosinophils Absolute: 0.1 10*3/uL (ref 0.0–0.7)
HCT: 33 % — ABNORMAL LOW (ref 39.0–52.0)
Hemoglobin: 10.5 g/dL — ABNORMAL LOW (ref 13.0–17.0)
LYMPHS ABS: 1 10*3/uL (ref 0.7–4.0)
LYMPHS PCT: 18 % (ref 12–46)
MCH: 31.3 pg (ref 26.0–34.0)
MCHC: 31.8 g/dL (ref 30.0–36.0)
MCV: 98.5 fL (ref 78.0–100.0)
Monocytes Absolute: 0.6 10*3/uL (ref 0.1–1.0)
Monocytes Relative: 11 % (ref 3–12)
NEUTROS ABS: 4.1 10*3/uL (ref 1.7–7.7)
NEUTROS PCT: 70 % (ref 43–77)
PLATELETS: 148 10*3/uL — AB (ref 150–400)
RBC: 3.35 MIL/uL — AB (ref 4.22–5.81)
RDW: 24 % — ABNORMAL HIGH (ref 11.5–15.5)
WBC: 5.8 10*3/uL (ref 4.0–10.5)

## 2014-07-22 LAB — ETHANOL: Alcohol, Ethyl (B): <5 mg/dL (ref <5)

## 2014-07-22 NOTE — ED Notes (Addendum)
PT states that his doctor instructed him to come to [place] to "straighten all of this out with my liver and fluid and for detox" "I think they wanted me to come up here for detox for probably at least a week" as opposed to going to [facility].

## 2014-07-22 NOTE — ED Notes (Addendum)
Pt reports scrotal and penile shaft swelling. Severe swelling noted by RN. Pt had fluid drained from peritoneal cavity Saturday. Took 4.5 L off. Has been done twice. Pt reports he has liver failure. Pt states he used to drink 2 5ths daily but has quit drinking 6 days ago; denies any seizure activity or others detoxing symptoms. Dialysis pt had it today. Got almost all of it except for 30 mins. Dr. Durene Cal him for evaluation and wants him to detox.

## 2014-07-23 DIAGNOSIS — N508 Other specified disorders of male genital organs: Secondary | ICD-10-CM | POA: Diagnosis not present

## 2014-07-23 LAB — PROTIME-INR
INR: 1.19 (ref 0.00–1.49)
PROTHROMBIN TIME: 15.3 s — AB (ref 11.6–15.2)

## 2014-07-23 NOTE — BH Assessment (Addendum)
[facility] Assessment Progress Note   Patient has been detoxed since he has not had any ETOH for 6 days.  Patient needs dialysis.  Clinician called [facility] and spoke to [place].  She said that she does not know of a rehabilitation facility that would let a patient leave to go to dialysis.  Clinician called [facility] and spoke to [place] who checked on bed availability.  She said that they could do the dialysis but there are no available beds on the rehabilitation unit.  Clinician spoke to nurse Allie Bossier checked to see if pt was a veteran.  There are some programs appropriate if patient is a English as a second language teacher.  But patient is not a English as a second language teacher.  Clinician spoke with Dr. Sharol Given about this situation.  She said that she will get some information together for patient regarding rehabilitation providers.  There is no medical necessity for a hospital admission at this time.  There is no rehab programming at [facility], detox is utilized then patients are transferred to rehab beds if they request or it is clinically adviseable.

## 2014-07-23 NOTE — ED Notes (Signed)
Dr Otter at bedside  

## 2014-07-23 NOTE — Discharge Instructions (Signed)
Your workup today showed swelling of the scrotum.  This is due to your ongoing liver disease and ascites, swelling in your abdomen.  To reduce the swelling in your scrotum, wear supportive undergarments and keep scrotum elevated when possible.  We have been unable to find inpatient treatment for you for your alcoholism due to your dialysis requirements.  Please refer to the handout sheet for outpatient resources to help with your ongoing sobriety.  Contact your gastroenterologist in the morning to set up a follow-up appointment to discuss your liver disease.  At this time you do not require further drainage of your fluid.  Monitor for fevers, worsening abdominal pain, or other new concerning symptoms.  Behavioral Health Resources in the [facility]  Intensive Outpatient Programs: [facility]      [place]. [place], [place] Both a day and evening program       [facility] Outpatient     [address]        [place], [place] [address] [phone]         ADS: Alcohol & [facility] [place]: [phone] or [phone] [address] [place], [place] [address] [email or web address]  Mobile Crisis Teams:                                        [facility]         Mobile Crisis Care Unit [phone]             [facility] [place] [address] Lady Gary Parks [address] [place] [phone]  Self-Help/Support Groups: [facility] [employer] of support groups [phone] (call for more info)  Narcotics Anonymous (NA) Caring Services [address] [place] - 2 meetings at this location  Residential Treatment Programs:  [place]       [place] [address], [place], [place]  [address] [place]  [address] [place], [place] [address] [phone] Admissions: 8am-3pm M-F  Incentives Substance [place]     [address] [place], [place] [address]       [phone]         [facility] [address] Jadene Pierini [place], [place]  [facility] [address] [place], [place]  [facility] - Intensive Outpatient      [phone]     [place], [place]         [facility] ([place]     [address] [place], [place] or [phone]  Residential Treatment Services ([place])  [place], [place]  [facility] [place]  [place]: [place]- [phone]               General Therapy                                                Domenic Schwab, PhD        [address] [place], [place] [address]         [facility]   [address] [place], [place] [address] [phone]  [facility] Recovery [address] [place], [place] [address] [phone] Insurance/Medicaid/sponsorship through [facility] and Families                                              [address] [place]                                        [place], [place] [address]    Therapy/tele-psych/case         [place] [address],   [address]  Adolescent/group home/case management [phone]                                           Rosette Reveal PhD       General therapy       Insurance   [phone]         Dr.  Adele Schilder Insurance [phone] M-F  [place] [facility] Medicaid, sponsorship [phone]    Finding Treatment for Alcohol and Drug Addiction It can be hard to find the right place to get professional treatment. Here are some important things to consider:  There are different types of treatment to choose from.  Some programs are live-in (residential) while others are not (outpatient). Sometimes a combination is offered.  No single type of program is right for everyone.  Most treatment programs involve a combination of education, counseling, and a 12-step, spiritually-based approach.  There are non-spiritually based programs (not 12-step).  Some treatment programs are government sponsored. They are geared for patients without private insurance.  Treatment programs can vary in many respects such as:  Cost and types of insurance accepted.  Types of on-site medical services offered.  Length of stay, setting, and size.  Overall philosophy of treatment. A person may need specialized treatment or have needs not addressed by all programs. For example, adolescents need treatment appropriate for their age. Other people have secondary disorders that must be managed as well. Secondary conditions can include mental illness, such as depression or diabetes. Often, a period of detoxification from alcohol or drugs is needed. This requires medical supervision and not all programs offer this. THINGS TO CONSIDER WHEN  SELECTING A TREATMENT PROGRAM   Is the program certified by the appropriate government agency? Even private programs must be certified and employ certified professionals.  Does the program accept your insurance? If not, can a payment plan be set up?  Is the facility clean, organized, and well run? Do they allow you to speak with graduates who can share their treatment experience with you? Can you tour the facility? Can you meet with staff?  Does the program meet the  full range of individual needs?  Does the treatment program address sexual orientation and physical disabilities? Do they provide age, gender, and culturally appropriate treatment services?  Is treatment available in languages other than English?  Is long-term aftercare support or guidance encouraged and provided?  Is assessment of an individual's treatment plan ongoing to ensure it meets changing needs?  Does the program use strategies to encourage reluctant patients to remain in treatment long enough to increase the likelihood of success?  Does the program offer counseling (individual or group) and other behavioral therapies?  Does the program offer medicine as part of the treatment regimen, if needed?  Is there ongoing monitoring of possible relapse? Is there a defined relapse prevention program? Are services or referrals offered to family members to ensure they understand addiction and the recovery process? This would help them support the recovering individual.  Are 12-step meetings held at the center or is transport available for patients to attend outside meetings? In countries outside of the [place] and [place], [employer], [employer] for contact information for services in your area. Document Released: 01/27/2005 Document Revised: 05/23/2011 Document Reviewed: 08/09/2007 [facility] Patient Information 2015 [place], [place]. This information is not intended to replace advice given to you by your health care provider. Make sure you discuss any questions you have with your health care provider.  Ascites Ascites is a gathering of fluid in the belly (abdomen). This is most often caused by liver disease. It may also be caused by a number of other less common problems. It causes a ballooning out (distension) of the abdomen. CAUSES  Scarring of the liver (cirrhosis) is the most common cause of ascites. Other causes include:  Infection or inflammation in the abdomen.  Cancer in the  abdomen.  Heart failure.  Certain forms of kidney failure (nephritic syndrome).  Inflammation of the pancreas.  Clots in the veins of the liver. SYMPTOMS  In the early stages of ascites, you may not have any symptoms. The main symptom of ascites is a sense of abdominal bloating. This is due to the presence of fluid. This may also cause an increase in abdominal or waist size. People with this condition can develop swelling in the legs, and men can develop a swollen scrotum. When there is a lot of fluid, it may be hard to breath. Stretching of the abdomen by fluid can be painful. DIAGNOSIS  Certain features of your medical history, such as a history of liver disease and of an enlarging abdomen, can suggest the presence of ascites. The diagnosis of ascites can be made on physical exam by your caregiver. An abdominal ultrasound examination can confirm that ascites is present, and estimate the amount of fluid. Once ascites is confirmed, it is important to determine its cause. Again, a history of one of the conditions listed in "CAUSES" provides a strong clue. A physical exam is important, and blood and X-ray tests may be needed. During a procedure called paracentesis, a sample of fluid is removed from the abdomen.  This can determine certain key features about the fluid, such as whether or not infection or cancer is present. Your caregiver will determine if a paracentesis is necessary. They will describe the procedure to you. PREVENTION  Ascites is a complication of other conditions. Therefore to prevent ascites, you must seek treatment for any significant health conditions you have. Once ascites is present, careful attention to fluid and salt intake may help prevent it from getting worse. If you have ascites, you should not drink alcohol. PROGNOSIS  The prognosis of ascites depends on the underlying disease. If the disease is reversible, such as with certain infections or with heart failure, then ascites  may improve or disappear. When ascites is caused by cirrhosis, then it indicates that the liver disease has worsened, and further evaluation and treatment of the liver disease is needed. If your ascites is caused by cancer, then the success or failure of the cancer treatment will determine whether your ascites will improve or worsen. RISKS AND COMPLICATIONS  Ascites is likely to worsen if it is not properly diagnosed and treated. A large amount of ascites can cause pain and difficulty breathing. The main complication, besides worsening, is infection (called spontaneous bacterial peritonitis). This requires prompt treatment. TREATMENT  The treatment of ascites depends on its cause. When liver disease is your cause, medical management using water pills (diuretics) and decreasing salt intake is often effective. Ascites due to peritoneal inflammation or malignancy (cancer) alone does not respond to salt restriction and diuretics. Hospitalization is sometimes required. If the treatment of ascites cannot be managed with medications, a number of other treatments are available. Your caregivers will help you decide which will work best for you. Some of these are:  Removal of fluid from the abdomen (paracentesis).  Fluid from the abdomen is passed into a vein (peritoneovenous shunting).  Liver transplantation.  Transjugular intrahepatic portosystemic stent shunt. HOME CARE INSTRUCTIONS  It is important to monitor body weight and the intake and output of fluids. Weigh yourself at the same time every day. Record your weights. Fluid restriction may be necessary. It is also important to know your salt intake. The more salt you take in, the more fluid you will retain. Ninety percent of people with ascites respond to this approach.  Follow any directions for medicines carefully.  Follow up with your caregiver, as directed.  Report any changes in your health, especially any new or worsening symptoms.  If your  ascites is from liver disease, avoid alcohol and other substances toxic to the liver. SEEK MEDICAL CARE IF:   Your weight increases more than a few pounds in a few days.  Your abdominal or waist size increases.  You develop swelling in your legs.  You had swelling and it worsens. SEEK IMMEDIATE MEDICAL CARE IF:   You develop a fever.  You develop new abdominal pain.  You develop difficulty breathing.  You develop confusion.  You have bleeding from the mouth, stomach, or rectum. MAKE SURE YOU:   Understand these instructions.  Will watch your condition.  Will get help right away if you are not doing well or get worse. Document Released: 02/28/2005 Document Revised: 05/23/2011 Document Reviewed: 09/29/2006 [facility] Patient Information 2015 [place], [place]. This information is not intended to replace advice given to you by your health care provider. Make sure you discuss any questions you have with your health care provider.  Scrotal Swelling Scrotal swelling may occur on one or both sides of the scrotum. Pain may also  occur with swelling. Possible causes of scrotal swelling include:   Injury.  Infection.  An ingrown hair or abrasion in the area.  Repeated rubbing from tight-fitting underwear.  Poor hygiene.  A weakened area in the muscles around the groin (hernia). A hernia can allow abdominal contents to push into the scrotum.  Fluid around the testicle (hydrocele).  Enlarged vein around the testicle (varicocele).  Certain medical treatments or existing conditions.  A recent genital surgery or procedure.  The spermatic cord becomes twisted in the scrotum, which cuts off blood supply (testicular torsion).  Testicular cancer. HOME CARE INSTRUCTIONS Once the cause of your scrotal swelling has been determined, you may be asked to monitor your scrotum for any changes. The following actions may help to alleviate any discomfort you are experiencing:  Rest and limit  activity until the swelling goes away. Lying down is the preferred position.  Put ice on the scrotum:  Put ice in a plastic bag.  Place a towel between your skin and the bag.  Leave the ice on for 20 minutes, 2-3 times a day for 1-2 days.  Place a rolled towel under the testicles for support.  Wear loose-fitting clothing or an athletic support cup for comfort.  Take all medicines as directed by your health care provider.  Perform a monthly self-exam of the scrotum and penis. Feel for changes. Ask your health care provider how to perform a monthly self-exam if you are unsure. SEEK MEDICAL CARE IF:  You have a sudden (acute) onset of pain that is persistent and not improving.  You notice a heavy feeling or fluid in the scrotum.  You have pain or burning while urinating.  You have blood in the urine or semen.  You feel a lump around the testicle.  You notice that one testicle is larger than the other (slight variation is normal).  You have a persistent dull ache or pain in the groin or scrotum. SEEK IMMEDIATE MEDICAL CARE IF:  The pain does not go away or becomes severe.  You have a fever or shaking chills.  You have pain or vomiting that cannot be controlled.  You notice significant redness or swelling of one or both sides of the scrotum.  You experience redness spreading upward from your scrotum to your abdomen or downward from your scrotum to your thighs. MAKE SURE YOU:  Understand these instructions.  Will watch your condition.  Will get help right away if you are not doing well or get worse. Document Released: 04/02/2010 Document Revised: 10/31/2012 Document Reviewed: 08/02/2012 [facility] Patient Information 2015 [place], [place]. This information is not intended to replace advice given to you by your health care provider. Make sure you discuss any questions you have with your health care provider.

## 2014-07-23 NOTE — ED Provider Notes (Signed)
CSN: [id number]     Arrival date & time 07/22/14  2035 History  This chart was scribed for Linton Flemings, MD by Irene Pap, ED Scribe. This patient was seen in room A10C/A10C and patient care was started at 12:09 AM.    Chief Complaint  Patient presents with  . Groin Swelling   The history is provided by the patient. No language interpreter was used.    HPI Comments: Stanley [name] is a 54 y.o. male with a history of alcohol abuse who presents to the Emergency Department complaining of groin swelling onset earlier today. He states that he had his abdomen drained in April and again on on 5/7 and does not know if this is an infection after this procedure. He states that the lab results have not come back yet. 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 He states that he saw a liver specialist in [facility] but has not heard anything back from the doctor. He states that he has not called for a follow-up with Dr. Candace Cruise, the GI doctor. He reports having a colonoscopy prior to having his liver drained. He states that his last drink was 6 days ago. He reports that Dr. [name] wanted him to come in tomorrow for detox but states that he came in for the groin swelling. He states that he had quit drinking for 17 years but began to drink again when he got sick and had to go on dialysis. He states that he last had dialysis today but was unable to stay for the entire session due to cramping. He denies fever or abdominal pain.   Patient denies any withdrawal symptoms, no weakness no dizziness.  No nausea no tremulousness.  He denies previous history of alcohol withdrawal seizures or DTs.  Past Medical History  Diagnosis Date  . Coronary artery disease   . Hypertension   . GERD (gastroesophageal reflux disease)   . Suicidal ideation     & HOMICIDAL IDEATION --  06-16-2013   ADMITTED TO BEHAVIOR HEALTH  . Alcohol abuse   . Drug abuse   . Diabetic peripheral  neuropathy   . End stage renal disease on dialysis NEPHROLOGIST-   DR [facility]  [place]    HEMODIALYSIS --   TUES/  THURS/  SAT   Past Surgical History  Procedure Laterality Date  . Dialysis fistula creation  LAST SURGERY  APPOX  2008  . Orif mandibular fracture N/A 06/05/2013    Procedure: REPAIR OF MANDIBULAR FRACTURE x 2 with maxillo-mandibular fixation ;  Surgeon: Theodoro Kos, DO;  Location: [place];  Service: Plastics;  Laterality: N/A;  . Coronary artery bypass graft  2008  ([place])    3 VESSEL  . Coronary angioplasty  ?    PT UNABLE TO TELL IF  BEFORE OR AFTER  CABG  . Mandibular hardware removal N/A 07/29/2013    Procedure: REMOVAL OF ARCH BARS;  Surgeon: Theodoro Kos, DO;  Location: [place];  Service: Plastics;  Laterality: N/A;   Family History  Problem Relation Age of Onset  . Family history unknown: Yes   History  Substance Use Topics  . Smoking status: Current Every Day Smoker -- 0.50 packs/day for 40 years    Types: Cigarettes  . Smokeless tobacco: Never Used  . Alcohol Use: No    Review of Systems  Constitutional: Negative for fever.  Gastrointestinal: Negative for abdominal pain.  Genitourinary: Positive for scrotal swelling.  All other systems reviewed and are negative.  Allergies  Review of patient's allergies indicates no known allergies.  Home Medications   Prior to Admission medications   Medication Sig Start Date End Date Taking? Authorizing Provider  amLODipine (NORVASC) 5 MG tablet Take 5 mg by mouth daily.    Yes Historical Provider, MD  aspirin EC 81 MG tablet Take 81 mg by mouth daily.   Yes Historical Provider, MD  atorvastatin (LIPITOR) 40 MG tablet Take 40 mg by mouth daily.   Yes Historical Provider, MD  benazepril (LOTENSIN) 10 MG tablet Take 10 mg by mouth daily.   Yes Historical Provider, MD  calcium acetate (PHOSLO) 667 MG capsule Take 2,001 mg by mouth 3 (three) times daily with meals.   Yes Historical  Provider, MD  gabapentin (NEURONTIN) 300 MG capsule Take 300 mg by mouth daily.    Yes Historical Provider, MD  labetalol (NORMODYNE) 200 MG tablet Take 200 mg by mouth daily.    Yes Historical Provider, MD  Multiple Vitamins-Minerals (MULTIVITAMIN WITH MINERALS) tablet Take 1 tablet by mouth daily.   Yes Historical Provider, MD  omeprazole (PRILOSEC) 20 MG capsule Take 20 mg by mouth every morning.    Yes Historical Provider, MD  HYDROcodone-acetaminophen (NORCO/VICODIN) 5-325 MG per tablet Take 1 tablet by mouth every 6 (six) hours as needed for severe pain. Patient not taking: Reported on 07/22/2014 06/22/14   Carmin Muskrat, MD   BP 148/96 mmHg  Pulse 98  Temp(Src) 97.8 F (36.6 C) (Oral)  Resp 18  Wt 188 lb (85.276 kg)  SpO2 100% Physical Exam  Constitutional: He is oriented to person, place, and time. He appears well-developed and well-nourished.  HENT:  Head: Normocephalic and atraumatic.  Nose: Nose normal.  Mouth/Throat: Oropharynx is clear and moist.  Eyes: Conjunctivae and EOM are normal. Pupils are equal, round, and reactive to light.  Neck: Normal range of motion. Neck supple. No JVD present. No tracheal deviation present. No thyromegaly present.  Cardiovascular: Normal rate, regular rhythm, normal heart sounds and intact distal pulses.  Exam reveals no gallop and no friction rub.   No murmur heard. Pulmonary/Chest: Effort normal and breath sounds normal. No stridor. No respiratory distress. He has no wheezes. He has no rales. He exhibits no tenderness.  Abdominal: Soft. Bowel sounds are normal. He exhibits distension. He exhibits no mass. There is tenderness. There is no rebound and no guarding.  Patient has ascites and positive fluid wave  Genitourinary:  Patient has penile and scrotal edema.  No signs of hernia bilaterally.  Edema is nonpitting.  Musculoskeletal: Normal range of motion. He exhibits no edema or tenderness.  Lymphadenopathy:    He has no cervical  adenopathy.  Neurological: He is alert and oriented to person, place, and time. He displays normal reflexes. He exhibits normal muscle tone. Coordination normal.  Skin: Skin is warm and dry. No rash noted. No erythema. No pallor.  Psychiatric: He has a normal mood and affect. His behavior is normal. Judgment and thought content normal.  Nursing note and vitals reviewed.   ED Course  Procedures (including critical care time) DIAGNOSTIC STUDIES: Oxygen Saturation is 100% on room air, normal by my interpretation.    COORDINATION OF CARE: 12:29 AM-Discussed treatment plan which includes labs with pt at bedside and pt agreed to plan.   Labs Review Labs Reviewed  COMPREHENSIVE METABOLIC PANEL - Abnormal; Notable for the following:    Glucose, Bld 108 (*)  BUN 40 (*)    Creatinine, Ser 7.65 (*)    Calcium 8.7 (*)    Total Protein 6.4 (*)    Albumin 2.8 (*)    Alkaline Phosphatase 177 (*)    GFR calc non Af Amer 7 (*)    GFR calc Af Amer 8 (*)    All other components within normal limits  CBC WITH DIFFERENTIAL/PLATELET - Abnormal; Notable for the following:    RBC 3.35 (*)    Hemoglobin 10.5 (*)    HCT 33.0 (*)    RDW 24.0 (*)    Platelets 148 (*)    All other components within normal limits  ETHANOL  PROTIME-INR    Imaging Review No results found.   EKG Interpretation None      MDM   Final diagnoses:  Ascites  Scrotal swelling  Alcoholism    54 year old male with ongoing ascites, new onset scrotal and penile edema.  Patient has not had a drink in 6 days.  He reports that he was sent by his nephrologist.  Unfortunately, due to his ongoing dialysis needs, there is no inpatient rehabilitation program that is available to him.  He'll be given outpatient resources for rehabilitation facilities.  Scrotal edema most likely secondary to edema from his ascites.  Patient instructed to elevate the region.  Patient to follow-up with his gastroenterologist.  I personally  performed the services described in this documentation, which was scribed in my presence. The recorded information has been reviewed and is accurate.      Linton Flemings, MD 07/23/14 [phone]

## 2014-07-23 NOTE — ED Notes (Signed)
MD at bedside. 

## 2014-08-29 ENCOUNTER — Other Ambulatory Visit: Payer: Self-pay | Admitting: Internal Medicine

## 2014-08-29 DIAGNOSIS — R188 Other ascites: Principal | ICD-10-CM

## 2014-08-29 DIAGNOSIS — K7460 Unspecified cirrhosis of liver: Secondary | ICD-10-CM

## 2014-09-01 ENCOUNTER — Ambulatory Visit
Admission: RE | Admit: 2014-09-01 | Discharge: 2014-09-01 | Disposition: A | Discharge status: Home / Self Care | Payer: Medicare Other | Source: Ambulatory Visit | Attending: Internal Medicine | Admitting: Internal Medicine

## 2014-10-14 ENCOUNTER — Other Ambulatory Visit: Payer: Self-pay | Admitting: Radiology

## 2014-10-15 ENCOUNTER — Ambulatory Visit
Admission: RE | Admit: 2014-10-15 | Discharge: 2014-10-15 | Disposition: A | Discharge status: Home / Self Care | Payer: Medicare Other | Source: Ambulatory Visit | Attending: Internal Medicine | Admitting: Internal Medicine

## 2014-10-20 ENCOUNTER — Other Ambulatory Visit: Payer: Self-pay

## 2014-10-20 ENCOUNTER — Emergency Department
Admission: EM | Admit: 2014-10-20 | Discharge: 2014-10-20 | Disposition: A | Discharge status: Home / Self Care | Payer: Medicare Other | Attending: Emergency Medicine | Admitting: Emergency Medicine

## 2014-10-20 ENCOUNTER — Emergency Department: Payer: Medicare Other

## 2014-10-20 ENCOUNTER — Encounter: Payer: Self-pay | Admitting: Emergency Medicine

## 2014-10-20 DIAGNOSIS — Z720 Tobacco use: Secondary | ICD-10-CM | POA: Diagnosis not present

## 2014-10-20 DIAGNOSIS — I120 Hypertensive chronic kidney disease with stage 5 chronic kidney disease or end stage renal disease: Secondary | ICD-10-CM | POA: Diagnosis not present

## 2014-10-20 DIAGNOSIS — R0682 Tachypnea, not elsewhere classified: Secondary | ICD-10-CM | POA: Insufficient documentation

## 2014-10-20 DIAGNOSIS — Z992 Dependence on renal dialysis: Secondary | ICD-10-CM | POA: Diagnosis not present

## 2014-10-20 DIAGNOSIS — Z79899 Other long term (current) drug therapy: Secondary | ICD-10-CM | POA: Diagnosis not present

## 2014-10-20 DIAGNOSIS — K219 Gastro-esophageal reflux disease without esophagitis: Secondary | ICD-10-CM | POA: Insufficient documentation

## 2014-10-20 DIAGNOSIS — Z79811 Long term (current) use of aromatase inhibitors: Secondary | ICD-10-CM | POA: Insufficient documentation

## 2014-10-20 DIAGNOSIS — Z7982 Long term (current) use of aspirin: Secondary | ICD-10-CM | POA: Diagnosis not present

## 2014-10-20 DIAGNOSIS — R140 Abdominal distension (gaseous): Secondary | ICD-10-CM | POA: Diagnosis present

## 2014-10-20 DIAGNOSIS — E1140 Type 2 diabetes mellitus with diabetic neuropathy, unspecified: Secondary | ICD-10-CM | POA: Insufficient documentation

## 2014-10-20 DIAGNOSIS — N186 End stage renal disease: Secondary | ICD-10-CM | POA: Diagnosis not present

## 2014-10-20 DIAGNOSIS — R188 Other ascites: Secondary | ICD-10-CM | POA: Insufficient documentation

## 2014-10-20 LAB — CBC WITH DIFFERENTIAL/PLATELET
Basophils Absolute: 0 10*3/uL (ref 0–0.1)
Basophils Relative: 1 %
Eosinophils Absolute: 0.1 10*3/uL (ref 0–0.7)
Eosinophils Relative: 2 %
HCT: 33.4 % — ABNORMAL LOW (ref 40.0–52.0)
Hemoglobin: 10.9 g/dL — ABNORMAL LOW (ref 13.0–18.0)
Lymphocytes Relative: 12 %
Lymphs Abs: 0.7 10*3/uL — ABNORMAL LOW (ref 1.0–3.6)
MCH: 31.1 pg (ref 26.0–34.0)
MCHC: 32.7 g/dL (ref 32.0–36.0)
MCV: 94.9 fL (ref 80.0–100.0)
MONO ABS: 0.7 10*3/uL (ref 0.2–1.0)
MONOS PCT: 11 %
NEUTROS ABS: 4.6 10*3/uL (ref 1.4–6.5)
Neutrophils Relative %: 74 %
Platelets: 198 10*3/uL (ref 150–440)
RBC: 3.52 MIL/uL — AB (ref 4.40–5.90)
RDW: 19.8 % — AB (ref 11.5–14.5)
WBC: 6.2 10*3/uL (ref 3.8–10.6)

## 2014-10-20 LAB — TROPONIN I

## 2014-10-20 LAB — BASIC METABOLIC PANEL
Anion gap: 9 (ref 5–15)
BUN: 34 mg/dL — AB (ref 6–20)
CO2: 28 mmol/L (ref 22–32)
CREATININE: 7.02 mg/dL — AB (ref 0.61–1.24)
Calcium: 9.6 mg/dL (ref 8.9–10.3)
Chloride: 103 mmol/L (ref 101–111)
GFR calc Af Amer: 9 mL/min — ABNORMAL LOW (ref >60)
GFR calc non Af Amer: 8 mL/min — ABNORMAL LOW (ref >60)
Glucose, Bld: 107 mg/dL — ABNORMAL HIGH (ref 65–99)
Potassium: 3.9 mmol/L (ref 3.5–5.1)
SODIUM: 140 mmol/L (ref 135–145)

## 2014-10-20 LAB — PROTIME-INR
INR: 1.85
PROTHROMBIN TIME: 21.5 s — AB (ref 11.4–15.0)

## 2014-10-20 LAB — APTT: aPTT: 56 seconds — ABNORMAL HIGH (ref 24–36)

## 2014-10-20 MED ORDER — FAMOTIDINE 20 MG PO TABS
40.0000 mg | ORAL_TABLET | Freq: Once | ORAL | Status: AC
  Administered 2014-10-20: 40 mg via ORAL
  Filled 2014-10-20: qty 2

## 2014-10-20 MED ORDER — GI COCKTAIL ~~LOC~~
30.0000 mL | Freq: Once | ORAL | Status: AC
  Administered 2014-10-20: 30 mL via ORAL
  Filled 2014-10-20: qty 30

## 2014-10-20 NOTE — Discharge Instructions (Signed)
Ascites °Ascites is a gathering of fluid in the belly (abdomen). This is most often caused by liver disease. It may also be caused by a number of other less common problems. It causes a ballooning out (distension) of the abdomen. °CAUSES  °Scarring of the liver (cirrhosis) is the most common cause of ascites. Other causes include: °· Infection or inflammation in the abdomen. °· Cancer in the abdomen. °· Heart failure. °· Certain forms of kidney failure (nephritic syndrome). °· Inflammation of the pancreas. °· Clots in the veins of the liver. °SYMPTOMS  °In the early stages of ascites, you may not have any symptoms. The main symptom of ascites is a sense of abdominal bloating. This is due to the presence of fluid. This may also cause an increase in abdominal or waist size. People with this condition can develop swelling in the legs, and men can develop a swollen scrotum. When there is a lot of fluid, it may be hard to breath. Stretching of the abdomen by fluid can be painful. °DIAGNOSIS  °Certain features of your medical history, such as a history of liver disease and of an enlarging abdomen, can suggest the presence of ascites. The diagnosis of ascites can be made on physical exam by your caregiver. An abdominal ultrasound examination can confirm that ascites is present, and estimate the amount of fluid. °Once ascites is confirmed, it is important to determine its cause. Again, a history of one of the conditions listed in "CAUSES" provides a strong clue. A physical exam is important, and blood and X-ray tests may be needed. During a procedure called paracentesis, a sample of fluid is removed from the abdomen. This can determine certain key features about the fluid, such as whether or not infection or cancer is present. Your caregiver will determine if a paracentesis is necessary. They will describe the procedure to you. °PREVENTION  °Ascites is a complication of other conditions. Therefore to prevent ascites, you  must seek treatment for any significant health conditions you have. Once ascites is present, careful attention to fluid and salt intake may help prevent it from getting worse. If you have ascites, you should not drink alcohol. °PROGNOSIS  °The prognosis of ascites depends on the underlying disease. If the disease is reversible, such as with certain infections or with heart failure, then ascites may improve or disappear. When ascites is caused by cirrhosis, then it indicates that the liver disease has worsened, and further evaluation and treatment of the liver disease is needed. If your ascites is caused by cancer, then the success or failure of the cancer treatment will determine whether your ascites will improve or worsen. °RISKS AND COMPLICATIONS  °Ascites is likely to worsen if it is not properly diagnosed and treated. A large amount of ascites can cause pain and difficulty breathing. The main complication, besides worsening, is infection (called spontaneous bacterial peritonitis). This requires prompt treatment. °TREATMENT  °The treatment of ascites depends on its cause. When liver disease is your cause, medical management using water pills (diuretics) and decreasing salt intake is often effective. Ascites due to peritoneal inflammation or malignancy (cancer) alone does not respond to salt restriction and diuretics. Hospitalization is sometimes required. °If the treatment of ascites cannot be managed with medications, a number of other treatments are available. Your caregivers will help you decide which will work best for you. Some of these are: °· Removal of fluid from the abdomen (paracentesis). °· Fluid from the abdomen is passed into a vein (peritoneovenous shunting). °·   Liver transplantation. °· Transjugular intrahepatic portosystemic stent shunt. °HOME CARE INSTRUCTIONS  °It is important to monitor body weight and the intake and output of fluids. Weigh yourself at the same time every day. Record your  weights. Fluid restriction may be necessary. It is also important to know your salt intake. The more salt you take in, the more fluid you will retain. Ninety percent of people with ascites respond to this approach. °· Follow any directions for medicines carefully. °· Follow up with your caregiver, as directed. °· Report any changes in your health, especially any new or worsening symptoms. °· If your ascites is from liver disease, avoid alcohol and other substances toxic to the liver. °SEEK MEDICAL CARE IF:  °· Your weight increases more than a few pounds in a few days. °· Your abdominal or waist size increases. °· You develop swelling in your legs. °· You had swelling and it worsens. °SEEK IMMEDIATE MEDICAL CARE IF:  °· You develop a fever. °· You develop new abdominal pain. °· You develop difficulty breathing. °· You develop confusion. °· You have bleeding from the mouth, stomach, or rectum. °MAKE SURE YOU:  °· Understand these instructions. °· Will watch your condition. °· Will get help right away if you are not doing well or get worse. °Document Released: 02/28/2005 Document Revised: 05/23/2011 Document Reviewed: 09/29/2006 °ExitCare® Patient Information ©2015 [employer]. This information is not intended to replace advice given to you by your health care provider. Make sure you discuss any questions you have with your health care provider. ° °

## 2014-10-20 NOTE — ED Provider Notes (Signed)
[facility] Emergency Department Provider Note  ____________________________________________  Time seen: Approximately 12:30 PM  I have reviewed the triage vital signs and the nursing notes.   HISTORY  Chief Complaint Bloated    HPI Stanley [name] is a 54 y.o. male with a history of cirrhosis and ascites. He says that he usually gets his belly tapped every 2 months for paracentesis. He was supposed to have his paracentesis this past Wednesday but was unable to get transportation to his appointment here at [facility]. He is now presenting with difficulty breathing as well as a stented abdomen. He says he hasn't had any pain in his belly. Denies any fever, nausea vomiting or diarrhea. Does say he has some burning in his chest and his throat which feels like his indigestion. Says he did not take any of his medications this morning.Patient is a dialysis patient. Was last dialyzed this past Saturday. Scheduled to be dialyzed again tomorrow.   Past Medical History  Diagnosis Date  . Coronary artery disease   . Hypertension   . GERD (gastroesophageal reflux disease)   . Suicidal ideation     & HOMICIDAL IDEATION --  06-16-2013   ADMITTED TO BEHAVIOR HEALTH  . Alcohol abuse   . Drug abuse   . Diabetic peripheral neuropathy   . End stage renal disease on dialysis NEPHROLOGIST-   DR [facility]  [place]    HEMODIALYSIS --   TUES/  THURS/  SAT    Patient Active Problem List   Diagnosis Date Noted  . Homicidal ideation 06/19/2013  . Suicidal intent 06/19/2013  . Homicidal ideations 06/19/2013  . Hyperkalemia 06/16/2013  . Mandible fracture 06/05/2013  . Fracture, mandible 06/02/2013  . Coronary atherosclerosis of native coronary artery 06/02/2013  . ESRD on dialysis 06/02/2013  . Mandible open fracture 06/02/2013    Past Surgical History  Procedure Laterality Date  . Dialysis fistula creation  LAST SURGERY  APPOX  2008  . Orif mandibular fracture N/A  06/05/2013    Procedure: REPAIR OF MANDIBULAR FRACTURE x 2 with maxillo-mandibular fixation ;  Surgeon: Theodoro Kos, DO;  Location: [place];  Service: Plastics;  Laterality: N/A;  . Coronary artery bypass graft  2008  ([place] , [place])    3 VESSEL  . Coronary angioplasty  ?    PT UNABLE TO TELL IF  BEFORE OR AFTER  CABG  . Mandibular hardware removal N/A 07/29/2013    Procedure: REMOVAL OF ARCH BARS;  Surgeon: Theodoro Kos, DO;  Location: [place];  Service: Plastics;  Laterality: N/A;    Current Outpatient Rx  Name  Route  Sig  Dispense  Refill  . amLODipine (NORVASC) 5 MG tablet   Oral   Take 5 mg by mouth daily.          Marland Kitchen aspirin EC 81 MG tablet   Oral   Take 81 mg by mouth daily.         Marland Kitchen atorvastatin (LIPITOR) 40 MG tablet   Oral   Take 40 mg by mouth daily.         . benazepril (LOTENSIN) 10 MG tablet   Oral   Take 10 mg by mouth daily.         . calcium acetate (PHOSLO) 667 MG capsule   Oral   Take 2,001 mg by mouth 3 (three) times daily with meals.         . gabapentin (NEURONTIN) 300 MG capsule  Oral   Take 300 mg by mouth daily.          Marland Kitchen HYDROcodone-acetaminophen (NORCO/VICODIN) 5-325 MG per tablet   Oral   Take 1 tablet by mouth every 6 (six) hours as needed for severe pain. Patient not taking: Reported on 07/22/2014   6 tablet   0   . labetalol (NORMODYNE) 200 MG tablet   Oral   Take 200 mg by mouth daily.          . Multiple Vitamins-Minerals (MULTIVITAMIN WITH MINERALS) tablet   Oral   Take 1 tablet by mouth daily.         Marland Kitchen omeprazole (PRILOSEC) 20 MG capsule   Oral   Take 20 mg by mouth every morning.            Allergies Review of patient's allergies indicates no known allergies.  Family History  Problem Relation Age of Onset  . Family history unknown: Yes    Social History History  Substance Use Topics  . Smoking status: Current Every Day Smoker -- 0.50 packs/day for 40 years    Types:  Cigarettes  . Smokeless tobacco: Never Used  . Alcohol Use: No    Review of Systems Constitutional: No fever/chills Eyes: No visual changes. ENT: No sore throat. Cardiovascular: Burning that feels like indigestion in his throat and chest. Respiratory: Denies shortness of breath. Gastrointestinal: No abdominal pain.  No nausea, no vomiting.  No diarrhea.  No constipation. Genitourinary: Negative for dysuria. Musculoskeletal: Negative for back pain. Skin: Negative for rash. Neurological: Negative for headaches, focal weakness or numbness.  10-point ROS otherwise negative.  ____________________________________________   PHYSICAL EXAM:  VITAL SIGNS: ED Triage Vitals  Enc Vitals Group     BP 10/20/14 0948 128/79 mmHg     Pulse Rate 10/20/14 0948 78     Resp 10/20/14 0948 18     Temp 10/20/14 0948 98.1 F (36.7 C)     Temp Source 10/20/14 0948 Oral     SpO2 10/20/14 0948 97 %     Weight 10/20/14 0948 170 lb (77.111 kg)     Height 10/20/14 0948 5\' 5"  (1.651 m)     Head Cir --      Peak Flow --      Pain Score --      Pain Loc --      Pain Edu? --      Excl. in [place]? --     Constitutional: Alert and oriented. Well appearing and in no acute distress. Eyes: Conjunctivae are normal. PERRL. EOMI. Head: Atraumatic. Nose: No congestion/rhinnorhea. Mouth/Throat: Mucous membranes are moist.  Oropharynx non-erythematous. Neck: No stridor.   Cardiovascular: Normal rate, regular rhythm. Grossly normal heart sounds.  Good peripheral circulation. Respiratory: Tachypneic.  No retractions. Lungs CTAB. Gastrointestinal: Soft and nontender. Distended with tense ascites. No abdominal bruits. No CVA tenderness. Musculoskeletal: No lower extremity tenderness nor edema.  No joint effusions. Right upper extremity dialysis access with palpable thrill. Neurologic:  Normal speech and language. No gross focal neurologic deficits are appreciated. No gait instability. Skin:  Skin is warm, dry and  intact. No rash noted. Psychiatric: Mood and affect are normal. Speech and behavior are normal.  ____________________________________________   LABS (all labs ordered are listed, but only abnormal results are displayed)  Labs Reviewed  CBC WITH DIFFERENTIAL/PLATELET - Abnormal; Notable for the following:    RBC 3.52 (*)    Hemoglobin 10.9 (*)    HCT 33.4 (*)  RDW 19.8 (*)    Lymphs Abs 0.7 (*)    All other components within normal limits  BASIC METABOLIC PANEL - Abnormal; Notable for the following:    Glucose, Bld 107 (*)    BUN 34 (*)    Creatinine, Ser 7.02 (*)    GFR calc non Af Amer 8 (*)    GFR calc Af Amer 9 (*)    All other components within normal limits  TROPONIN I  PROTIME-INR  APTT   ____________________________________________  EKG  ED ECG REPORT I, Doran Stabler, the attending physician, personally viewed and interpreted this ECG.   Date: 10/20/2014  EKG Time: 1306  Rate: 85  Rhythm: normal sinus rhythm  Axis: Normal axis  Intervals:none  ST&T Change: No ST elevations or depressions. T-wave inversions in II, III, and F aVF are unchanged from previous.  ____________________________________________  RADIOLOGY  Patient had 4.5 L of fluid taken from his belly. ____________________________________________   PROCEDURES    ____________________________________________   INITIAL IMPRESSION / ASSESSMENT AND PLAN / ED COURSE  Pertinent labs & imaging results that were available during my care of the patient were reviewed by me and considered in my medical decision making (see chart for details). Discussed with Dr. Nyoka Cowden of radiology. Patient to be taken for paracentesis. ----------------------------------------- 1:26 PM on 10/20/2014 -----------------------------------------  Patient's chest burning is relieved after GI cocktail. Completely pain-free at this time.  ----------------------------------------- 3:27 PM on  10/20/2014 -----------------------------------------  Patient without any pain or shortness of breath at this time. His symptoms are completely relieved. We'll not send fluid studies as this was a therapeutic paracentesis. No concern for spontaneous bacterial peritonitis. We'll DC to home. Follow-up with Dr. Candace Cruise his gastrologist. Will be dialyzed tomorrow. ____________________________________________   FINAL CLINICAL IMPRESSION(S) / ED DIAGNOSES  Acute symptomatically studies. Acute GERD. Initial visit.    Orbie Pyo, MD 10/20/14 [id number]

## 2014-10-20 NOTE — Procedures (Signed)
Informed consent was obtained. Under US guidance, paracentesis was performed. No immediate complication.

## 2014-10-20 NOTE — ED Notes (Signed)
Patient transported to Ultrasound 

## 2014-10-20 NOTE — ED Notes (Signed)
Pt brought to ed by ems with reports of abd swelling. Had fluid removed from abd two mths ago. Called ems becauses he did not have a ride. No acute distress noted.

## 2014-11-30 IMAGING — CR DG CHEST 2V
1 series · 3 of 3 positions shown · non-contrast
Comparison: none

REASON FOR EXAM: Chest Pain
COMMENTS:

PROCEDURE:     DXR - DXR CHEST PA (OR AP) AND LATERAL  - January 04, 2013  [DATE]
RESULT:     The lungs are clear. The cardiac silhouette and visualized bony
skeleton are unremarkable. Patient status post median sternotomy and
coronary artery bypass grafting.

[Series 1: pa · 0.17mm/px · 3 of 3 slices shown]
[im 1/3]
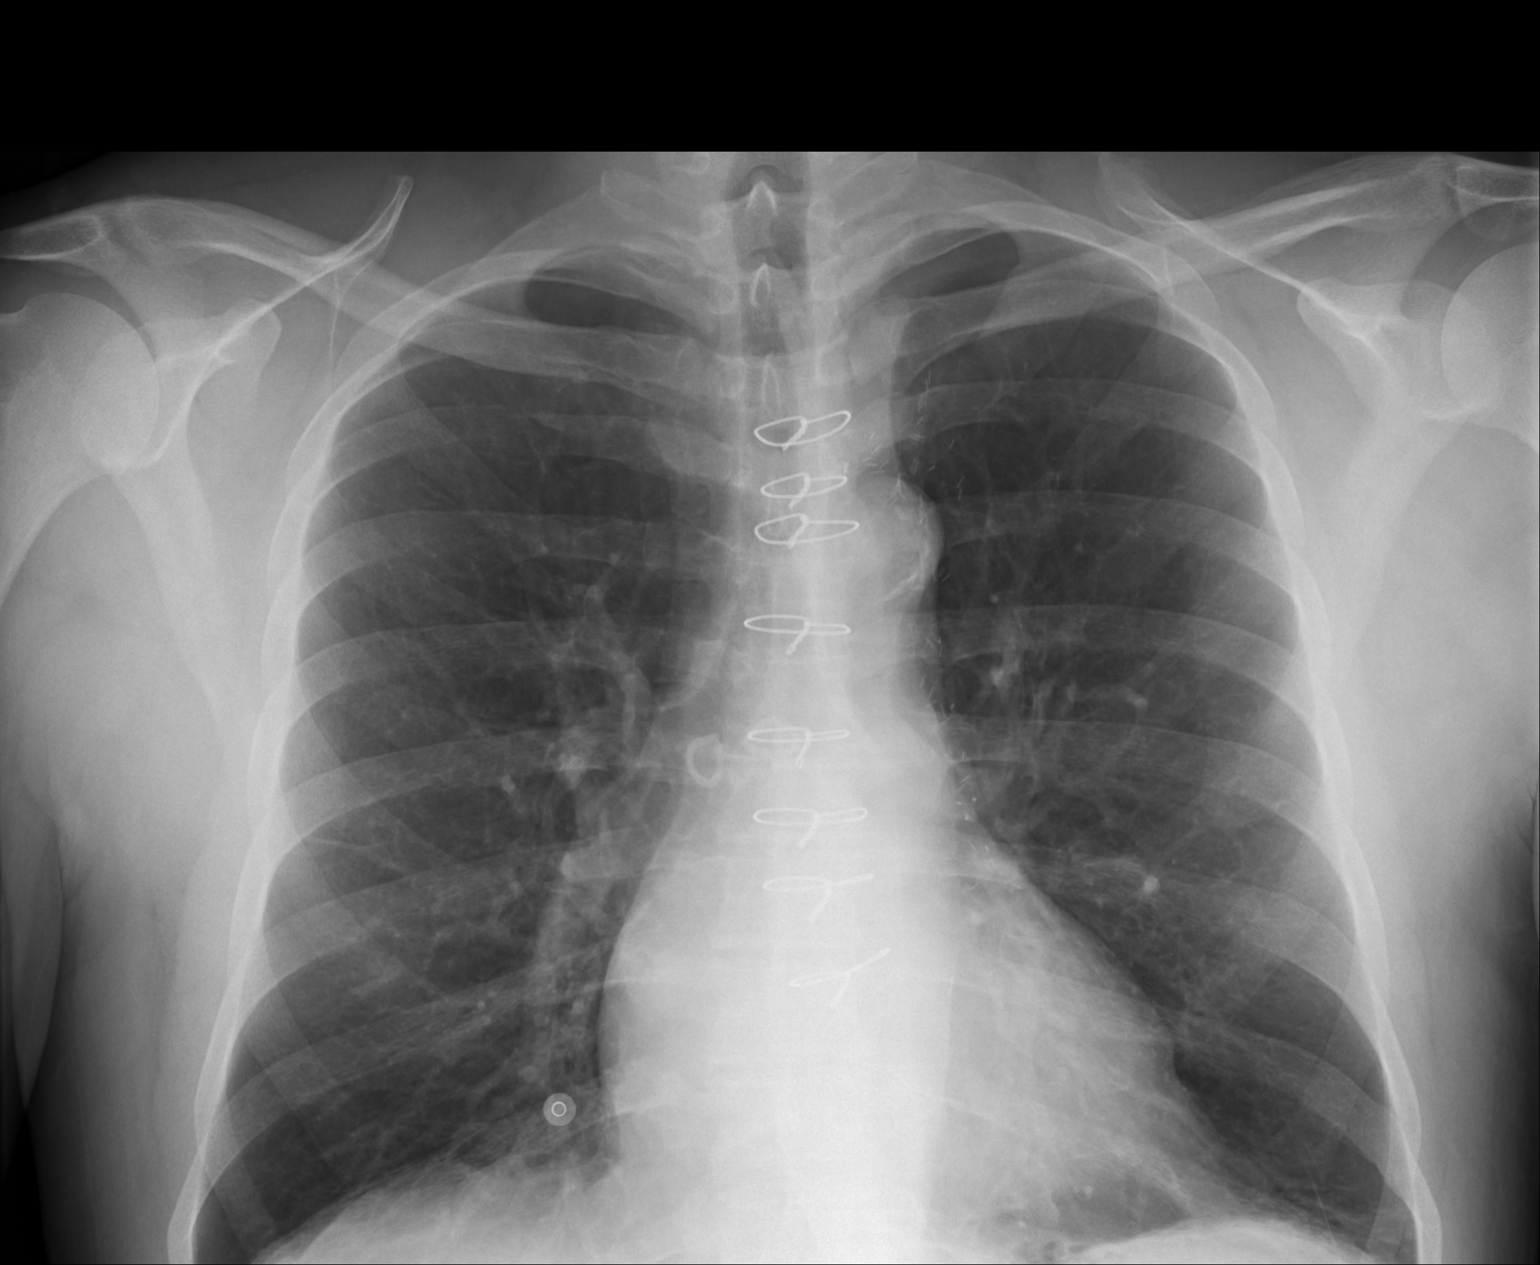
[im 2/3]
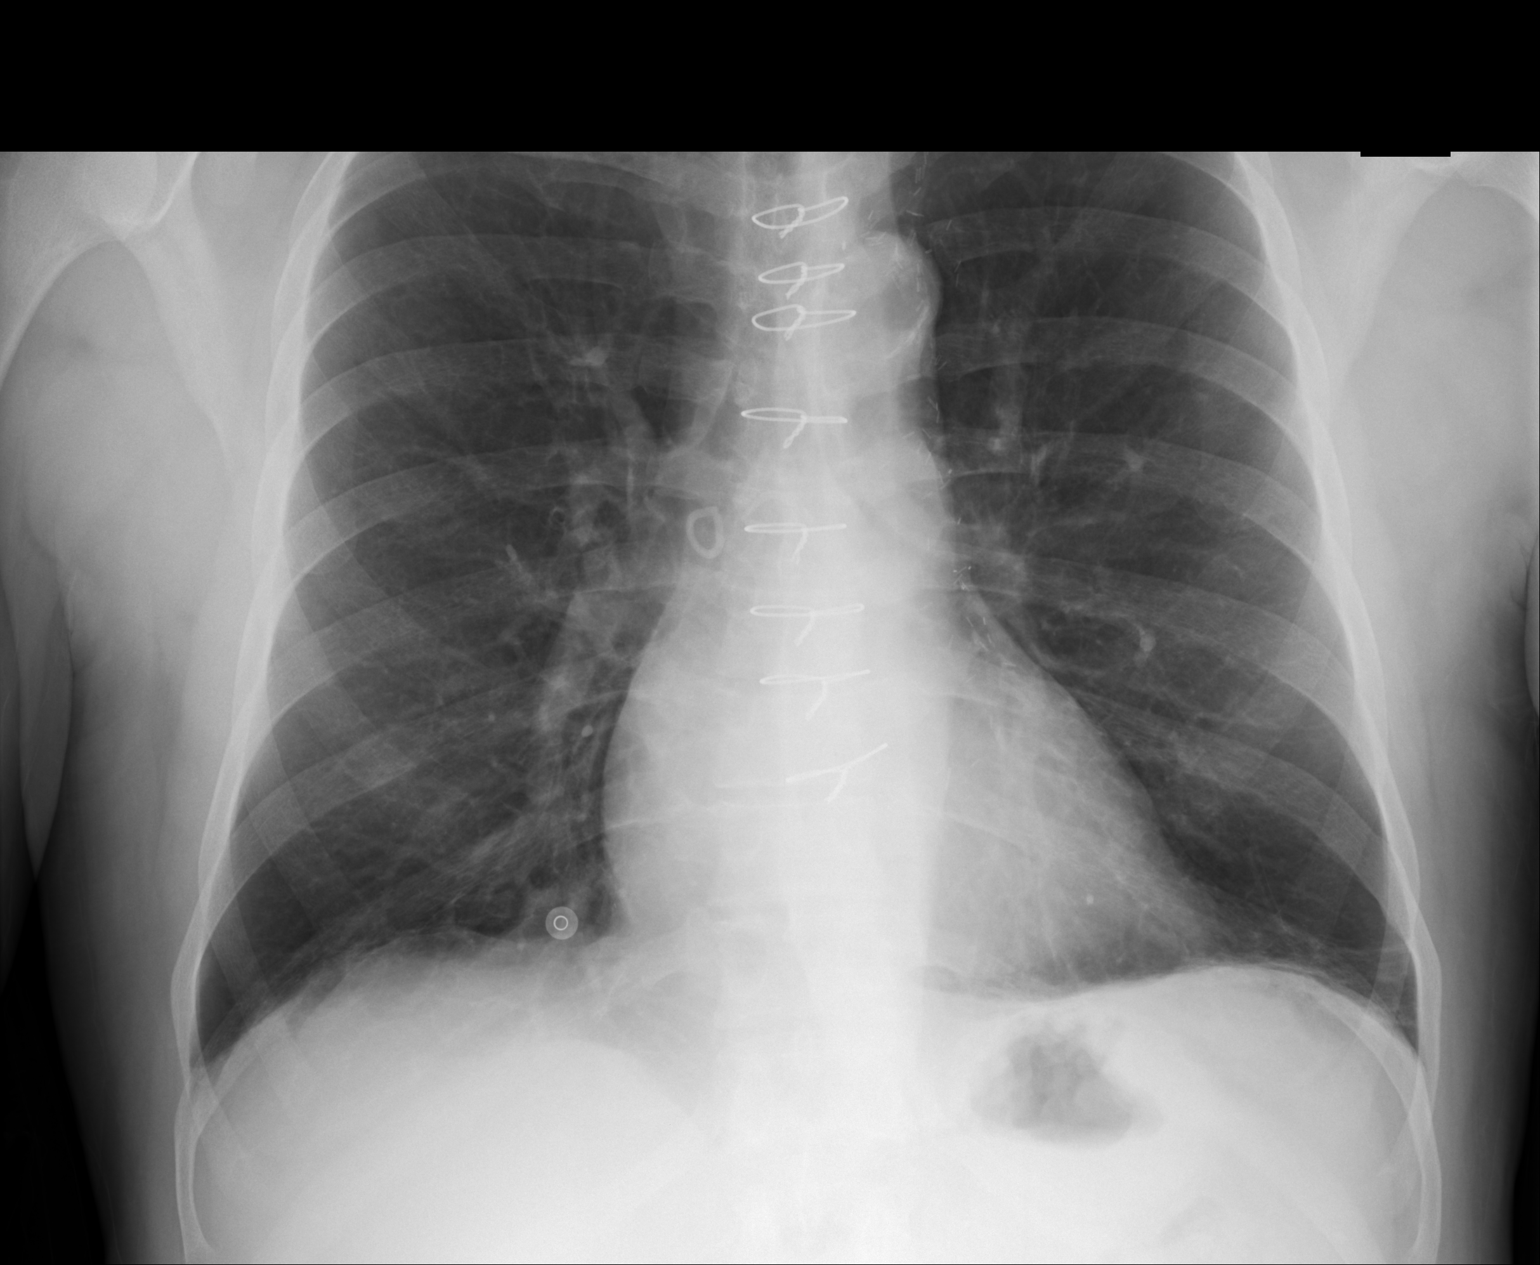
[im 3/3]
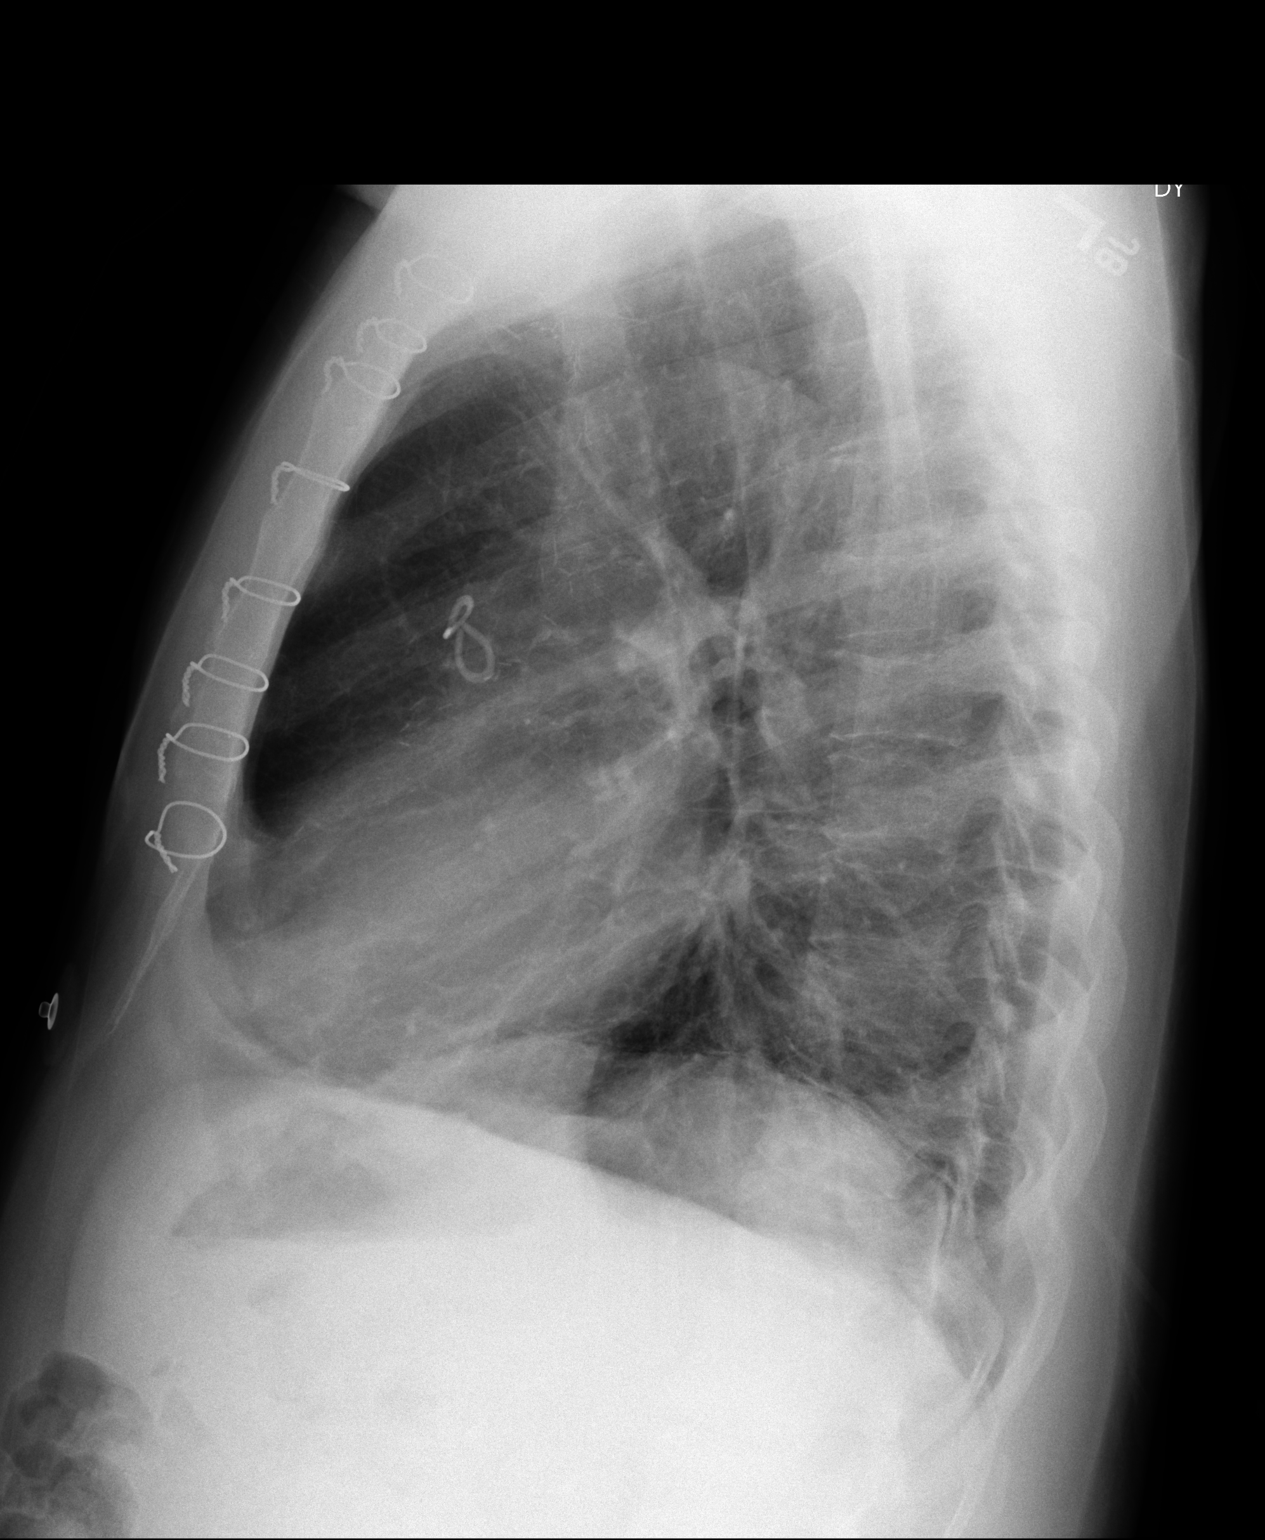

[3 of 3 positions shown; findings below may reference images not displayed]

IMPRESSION: 1. Chest radiograph without evidence of acute cardiopulmonary disease.
2. Comparison dated 06/18/2011.

## 2014-12-04 ENCOUNTER — Ambulatory Visit
Admission: RE | Admit: 2014-12-04 | Discharge: 2014-12-04 | Disposition: A | Discharge status: Home / Self Care | Payer: Medicare Other | Source: Ambulatory Visit | Attending: Internal Medicine | Admitting: Internal Medicine

## 2014-12-04 DIAGNOSIS — K7460 Unspecified cirrhosis of liver: Secondary | ICD-10-CM | POA: Diagnosis present

## 2014-12-04 DIAGNOSIS — R188 Other ascites: Secondary | ICD-10-CM | POA: Insufficient documentation

## 2014-12-04 NOTE — Discharge Instructions (Signed)
Paracentesis °Paracentesis is a procedure used to remove excess fluid from the belly (abdomen). Excess fluid in the belly is called ascites. Excess fluid can be the result of certain conditions, such as infection, inflammation, abdominal injury, heart failure, chronic scarring of the liver (cirrhosis), or cancer. The excess fluid is removed using a needle inserted through the skin and tissue into the abdomen.  °A paracentesis may be done to: °· Determine the cause of the excess fluid through examination of the fluid. °· Relieve symptoms of shortness of breath or pain caused by the excess fluid. °· Determine presence of bleeding after an abdominal injury. °LET YOUR CAREGIVERS KNOW ABOUT: °· Allergies. °· Medications taken including herbs, eye drops, over-the-counter medications, and creams. °· Use of steroids (by mouth or creams). °· Previous problems with anesthetics or numbing medicine. °· Possibility of pregnancy, if this applies. °· History of blood clots (thrombophlebitis). °· History of bleeding or blood problems. °· Previous surgery. °· Other health problems. °RISKS AND COMPLICATIONS °· Injury to an abdominal organ, such as the bowel (large intestine), liver, spleen, or bladder. °· Possible infection. °· Bleeding. °· Low blood pressure (hypotension). °BEFORE THE PROCEDURE °This is a procedure that can be done as an outpatient. Confirm the time that you need to arrive for your procedure. A blood sample may be done to determine your blood clotting time. The presence of a severe bleeding disorder (coagulopathy) which cannot be promptly corrected may make this procedure inadvisable. You may be asked to urinate. °PROCEDURE °The procedure will take about 30 minutes. This time will vary depending on the amount of fluid that is removed. You may be asked to lie on your back with your head elevated. An area on your abdomen will be cleansed. A numbing medicine may then be injected (local anesthesia) into the skin and  tissue. A needle is inserted through your abdominal skin and tissues until it is positioned in your abdomen. You may feel pressure or slight pain as the needle is positioned into the abdomen. Fluid is removed from the abdomen through the needle. Tell your caregiver if you feel dizzy or lightheaded. The needle is withdrawn once the desired amount of fluid has been removed. A sample of the fluid may be sent for examination.  °AFTER THE PROCEDURE °Your recovery will be assessed and monitored. If there are no problems, as an outpatient, you should be able to go home shortly after the procedure. There may be a very limited amount of clear fluid draining from the needle insertion site over the next 2 days. Confirm with your caregiver as to the expected amount of drainage. °Obtaining the Test Results °It is your responsibility to obtain your test results. Do not assume everything is normal if you have not heard from your caregiver or the medical facility. It is important for you to follow up on all of your test results. °HOME CARE INSTRUCTIONS  °· You may resume normal diet and activities as directed or allowed. °· Only take over-the-counter or prescription medicines for pain, discomfort, or fever as directed by your caregiver. °SEEK IMMEDIATE MEDICAL CARE IF: °· You develop shortness of breath or chest pain. °· You develop increasing pain, discomfort, or swelling in your abdomen. °· You develop new drainage or pus coming from site where fluid was removed. °· You develop swelling or increased redness from site where fluid was removed. °· You develop an unexplained temperature of 102° F (38.9° C) or above. °Document Released: 09/13/2004 Document Revised: 05/23/2011 Document   Reviewed: 10/20/2008 °ExitCare® Patient Information ©2015 [employer]. This information is not intended to replace advice given to you by your health care provider. Make sure you discuss any questions you have with your health care provider. ° °

## 2014-12-23 ENCOUNTER — Other Ambulatory Visit (HOSPITAL_COMMUNITY): Payer: Self-pay | Admitting: Internal Medicine

## 2014-12-23 DIAGNOSIS — R188 Other ascites: Secondary | ICD-10-CM

## 2014-12-24 ENCOUNTER — Other Ambulatory Visit: Payer: Self-pay | Admitting: Nephrology

## 2014-12-25 ENCOUNTER — Other Ambulatory Visit: Payer: Self-pay | Admitting: Radiology

## 2014-12-26 ENCOUNTER — Ambulatory Visit
Admission: RE | Admit: 2014-12-26 | Discharge: 2014-12-26 | Disposition: A | Discharge status: Home / Self Care | Payer: Medicare Other | Source: Ambulatory Visit | Attending: Internal Medicine | Admitting: Internal Medicine

## 2014-12-26 DIAGNOSIS — R188 Other ascites: Secondary | ICD-10-CM | POA: Diagnosis not present

## 2014-12-26 DIAGNOSIS — Z992 Dependence on renal dialysis: Secondary | ICD-10-CM | POA: Insufficient documentation

## 2014-12-26 DIAGNOSIS — I120 Hypertensive chronic kidney disease with stage 5 chronic kidney disease or end stage renal disease: Secondary | ICD-10-CM | POA: Insufficient documentation

## 2014-12-26 DIAGNOSIS — E1142 Type 2 diabetes mellitus with diabetic polyneuropathy: Secondary | ICD-10-CM | POA: Insufficient documentation

## 2014-12-26 DIAGNOSIS — I2510 Atherosclerotic heart disease of native coronary artery without angina pectoris: Secondary | ICD-10-CM | POA: Insufficient documentation

## 2014-12-26 DIAGNOSIS — F1010 Alcohol abuse, uncomplicated: Secondary | ICD-10-CM | POA: Insufficient documentation

## 2014-12-26 DIAGNOSIS — K219 Gastro-esophageal reflux disease without esophagitis: Secondary | ICD-10-CM | POA: Diagnosis not present

## 2014-12-26 MED ORDER — ALBUMIN HUMAN 25 % IV SOLN
25.0000 g | Freq: Once | INTRAVENOUS | Status: AC
  Administered 2014-12-26: 25 g via INTRAVENOUS
  Filled 2014-12-26: qty 100

## 2014-12-26 NOTE — H&P (Signed)
Chief Complaint: Patient was seen in consultation today for paracentesis at the request of Singh,Taran K  Referring Physician(s): Singh,Taran K  History of Present Illness: Stanley [name] is a 54 y.o. male with alcoholic cirrhosis and ESRD.  Patient presents for a therapeutic paracentesis.  Patient is distended but does not complain of abdominal pain.  Main complaint is chronic left leg pain.  No other complaints.  Past Medical History  Diagnosis Date  . Coronary artery disease   . Hypertension   . GERD (gastroesophageal reflux disease)   . Suicidal ideation     & HOMICIDAL IDEATION --  06-16-2013   ADMITTED TO BEHAVIOR HEALTH  . Alcohol abuse   . Drug abuse   . Diabetic peripheral neuropathy ([place])   . End stage renal disease on dialysis [facility]) NEPHROLOGIST-   DR [facility]  [place]    HEMODIALYSIS --   TUES/  THURS/  SAT    Past Surgical History  Procedure Laterality Date  . Dialysis fistula creation  LAST SURGERY  APPOX  2008  . Orif mandibular fracture N/A 06/05/2013    Procedure: REPAIR OF MANDIBULAR FRACTURE x 2 with maxillo-mandibular fixation ;  Surgeon: Theodoro Kos, DO;  Location: [place];  Service: Plastics;  Laterality: N/A;  . Coronary artery bypass graft  2008  ([place] , [place])    3 VESSEL  . Coronary angioplasty  ?    PT UNABLE TO TELL IF  BEFORE OR AFTER  CABG  . Mandibular hardware removal N/A 07/29/2013    Procedure: REMOVAL OF ARCH BARS;  Surgeon: Theodoro Kos, DO;  Location: [place];  Service: Plastics;  Laterality: N/A;    Allergies: Review of patient's allergies indicates no known allergies.  Medications: Prior to Admission medications   Medication Sig Start Date End Date Taking? Authorizing Provider  amLODipine (NORVASC) 5 MG tablet Take 5 mg by mouth daily.    Yes Historical Provider, MD  aspirin EC 81 MG tablet Take 81 mg by mouth daily.   Yes Historical Provider, MD  atorvastatin (LIPITOR) 40 MG tablet Take 40 mg by mouth  daily.   Yes Historical Provider, MD  benazepril (LOTENSIN) 10 MG tablet Take 10 mg by mouth daily.   Yes Historical Provider, MD  calcium acetate (PHOSLO) 667 MG capsule Take 2,001 mg by mouth 3 (three) times daily with meals.   Yes Historical Provider, MD  gabapentin (NEURONTIN) 300 MG capsule Take 300 mg by mouth daily.    Yes Historical Provider, MD  labetalol (NORMODYNE) 200 MG tablet Take 200 mg by mouth daily.    Yes Historical Provider, MD  Multiple Vitamins-Minerals (MULTIVITAMIN WITH MINERALS) tablet Take 1 tablet by mouth daily.   Yes Historical Provider, MD  omeprazole (PRILOSEC) 20 MG capsule Take 20 mg by mouth every morning.    Yes Historical Provider, MD  HYDROcodone-acetaminophen (NORCO/VICODIN) 5-325 MG per tablet Take 1 tablet by mouth every 6 (six) hours as needed for severe pain. Patient not taking: Reported on 07/22/2014 06/22/14   Carmin Muskrat, MD     Family History  Problem Relation Age of Onset  . Family history unknown: Yes    Social History   Social History  . Marital Status: Single    Spouse Name: N/A  . Number of Children: N/A  . Years of Education: N/A   Social History Main Topics  . Smoking status: Current Every Day Smoker -- 0.50 packs/day for 40 years    Types: Cigarettes  .  Smokeless tobacco: Never Used  . Alcohol Use: No  . Drug Use: 7.00 per week    Special: Marijuana, "Crack" cocaine     Comment: 06-18-2013  positive drug screen for cocaine--  (07-25-2013  DENIES ANY DRUG USE SINCE ADMISSION 06-16-2013)  . Sexual Activity: Not Asked   Other Topics Concern  . None   Social History Narrative      Review of Systems  Constitutional: Negative.   Respiratory: Negative.   Cardiovascular: Negative.   Gastrointestinal: Negative.   Genitourinary: Negative.     Vital Signs: BP 129/76 mmHg  Pulse 86  Temp(Src) 98.7 F (37.1 C) (Oral)  Resp 18  Physical Exam  Constitutional: He appears well-developed and well-nourished.    Cardiovascular: Normal rate, regular rhythm and normal heart sounds.   Pulmonary/Chest: Effort normal and breath sounds normal.  Abdominal: He exhibits distension. There is no tenderness.  Musculoskeletal:  Bandage on right upper arm from recent dialysis treatment.    Mallampati Score:     Imaging: US Paracentesis  12/04/2014  CLINICAL DATA:  Cirrhosis, recurrent ascites, abdominal distension EXAM: ULTRASOUND GUIDED PARACENTESIS TECHNIQUE: The procedure, risks (including but not limited to bleeding, infection, organ damage ), benefits, and alternatives were explained to the patient. Questions regarding the procedure were encouraged and answered. The patient understands and consents to the procedure. Survey ultrasound of the abdomen was performed and an appropriate skin entry site in the right lateral abdomen was selected. Skin site was marked, prepped with Betadine, and draped in usual sterile fashion, and infiltrated locally with 1% lidocaine. A Safe-T-Centesis sheath needle was advanced into the peritoneal space until fluid could be aspirated. The sheath was advanced and the needle removed. 5 L of amber ascites were aspirated. COMPLICATIONS: COMPLICATIONS none IMPRESSION: Technically successful ultrasound guided paracentesis, removing 5 L of ascites. Electronically Signed   By: Lucrezia Europe M.D.   On: 12/04/2014 15:45    Labs:  CBC:  Recent Labs  07/18/14 1620 07/19/14 0843 07/22/14 2052 10/20/14 0934  WBC 6.3 6.7 5.8 6.2  HGB 12.1* 11.9* 10.5* 10.9*  HCT 37.6* 36.2* 33.0* 33.4*  PLT 176 153 148* 198    COAGS:  Recent Labs  07/18/14 1620 07/19/14 0843 07/23/14 0030 10/20/14 1258  INR 1.09 1.13 1.19 1.85  APTT  --  29  --  56*    BMP:  Recent Labs  07/18/14 1620 07/19/14 0843 07/22/14 2052 10/20/14 0934  NA 137 137 139 140  K 4.0 4.2 4.8 3.9  CL 99* 101 104 103  CO2 25 24 23 28   GLUCOSE 105* 95 108* 107*  BUN 31* 43* 40* 34*  CALCIUM 8.9 9.0 8.7* 9.6   CREATININE 6.82* 7.50* 7.65* 7.02*  GFRNONAA 8* 7* 7* 8*  GFRAA 9* 8* 8* 9*    LIVER FUNCTION TESTS:  Recent Labs  06/22/14 1013 07/18/14 1620 07/19/14 0843 07/22/14 2052  BILITOT 0.9 0.9 2.0* 0.9  AST 29 35 31 23  ALT 27 31 30 23   ALKPHOS 175* 208* 207* 177*  PROT 6.7 7.5 7.0 6.4*  ALBUMIN 3.1* 3.3* 3.3* 2.8*    TUMOR MARKERS: No results for input(s): AFPTM, CEA, CA199, CHROMGRNA in the last 8760 hours.  Assessment and Plan:   Discussed ultrasound guided paracentesis.  Patient has had procedure before and has no questions or concerns.  Plan for ultrasound guided paracentesis with albumin infusion.    SignedCarylon Perches 12/26/2014, 12:53 PM

## 2014-12-26 NOTE — Procedures (Signed)
Successful US guided paracentesis.  See full report in PACS.

## 2015-01-23 ENCOUNTER — Other Ambulatory Visit: Payer: Self-pay | Admitting: Gastroenterology

## 2015-01-23 DIAGNOSIS — R188 Other ascites: Secondary | ICD-10-CM

## 2015-01-27 ENCOUNTER — Other Ambulatory Visit: Payer: Self-pay | Admitting: Radiology

## 2015-01-28 ENCOUNTER — Ambulatory Visit
Admission: RE | Admit: 2015-01-28 | Discharge: 2015-01-28 | Disposition: A | Discharge status: Home / Self Care | Payer: Medicare Other | Source: Ambulatory Visit | Attending: Gastroenterology | Admitting: Gastroenterology

## 2015-01-28 DIAGNOSIS — R188 Other ascites: Secondary | ICD-10-CM | POA: Diagnosis not present

## 2015-03-10 ENCOUNTER — Other Ambulatory Visit: Payer: Self-pay | Admitting: Gastroenterology

## 2015-03-10 DIAGNOSIS — K7460 Unspecified cirrhosis of liver: Secondary | ICD-10-CM

## 2015-03-10 DIAGNOSIS — R188 Other ascites: Principal | ICD-10-CM

## 2015-03-12 ENCOUNTER — Encounter: Payer: Self-pay | Admitting: Radiology

## 2015-03-15 DIAGNOSIS — Z992 Dependence on renal dialysis: Secondary | ICD-10-CM | POA: Diagnosis not present

## 2015-03-15 DIAGNOSIS — N186 End stage renal disease: Secondary | ICD-10-CM | POA: Diagnosis not present

## 2015-03-17 ENCOUNTER — Other Ambulatory Visit: Payer: Self-pay | Admitting: Nephrology

## 2015-03-17 DIAGNOSIS — N2581 Secondary hyperparathyroidism of renal origin: Secondary | ICD-10-CM | POA: Diagnosis not present

## 2015-03-17 DIAGNOSIS — N186 End stage renal disease: Secondary | ICD-10-CM | POA: Diagnosis not present

## 2015-03-17 DIAGNOSIS — D631 Anemia in chronic kidney disease: Secondary | ICD-10-CM | POA: Diagnosis not present

## 2015-03-17 DIAGNOSIS — R188 Other ascites: Secondary | ICD-10-CM

## 2015-03-17 DIAGNOSIS — D509 Iron deficiency anemia, unspecified: Secondary | ICD-10-CM | POA: Diagnosis not present

## 2015-03-17 DIAGNOSIS — Z992 Dependence on renal dialysis: Secondary | ICD-10-CM | POA: Diagnosis not present

## 2015-03-19 ENCOUNTER — Other Ambulatory Visit: Payer: Self-pay | Admitting: Radiology

## 2015-03-19 DIAGNOSIS — D631 Anemia in chronic kidney disease: Secondary | ICD-10-CM | POA: Diagnosis not present

## 2015-03-19 DIAGNOSIS — D509 Iron deficiency anemia, unspecified: Secondary | ICD-10-CM | POA: Diagnosis not present

## 2015-03-19 DIAGNOSIS — Z992 Dependence on renal dialysis: Secondary | ICD-10-CM | POA: Diagnosis not present

## 2015-03-19 DIAGNOSIS — N186 End stage renal disease: Secondary | ICD-10-CM | POA: Diagnosis not present

## 2015-03-19 DIAGNOSIS — N2581 Secondary hyperparathyroidism of renal origin: Secondary | ICD-10-CM | POA: Diagnosis not present

## 2015-03-20 ENCOUNTER — Ambulatory Visit: Admission: RE | Admit: 2015-03-20 | Payer: Medicare Other | Source: Ambulatory Visit | Admitting: Nephrology

## 2015-03-23 ENCOUNTER — Emergency Department: Payer: Medicare Other

## 2015-03-23 ENCOUNTER — Encounter: Payer: Self-pay | Admitting: Emergency Medicine

## 2015-03-23 ENCOUNTER — Inpatient Hospital Stay
Admission: EM | Admit: 2015-03-23 | Discharge: 2015-03-27 | DRG: 377 | Disposition: A | Discharge status: Home / Self Care | Payer: Medicare Other | Attending: Internal Medicine | Admitting: Internal Medicine

## 2015-03-23 DIAGNOSIS — N186 End stage renal disease: Secondary | ICD-10-CM | POA: Diagnosis present

## 2015-03-23 DIAGNOSIS — K219 Gastro-esophageal reflux disease without esophagitis: Secondary | ICD-10-CM | POA: Diagnosis present

## 2015-03-23 DIAGNOSIS — Z794 Long term (current) use of insulin: Secondary | ICD-10-CM

## 2015-03-23 DIAGNOSIS — Z79899 Other long term (current) drug therapy: Secondary | ICD-10-CM | POA: Diagnosis not present

## 2015-03-23 DIAGNOSIS — J40 Bronchitis, not specified as acute or chronic: Secondary | ICD-10-CM | POA: Diagnosis present

## 2015-03-23 DIAGNOSIS — Z7982 Long term (current) use of aspirin: Secondary | ICD-10-CM

## 2015-03-23 DIAGNOSIS — R509 Fever, unspecified: Secondary | ICD-10-CM

## 2015-03-23 DIAGNOSIS — K7031 Alcoholic cirrhosis of liver with ascites: Secondary | ICD-10-CM | POA: Diagnosis present

## 2015-03-23 DIAGNOSIS — D509 Iron deficiency anemia, unspecified: Secondary | ICD-10-CM | POA: Diagnosis present

## 2015-03-23 DIAGNOSIS — F1021 Alcohol dependence, in remission: Secondary | ICD-10-CM | POA: Diagnosis present

## 2015-03-23 DIAGNOSIS — R05 Cough: Secondary | ICD-10-CM

## 2015-03-23 DIAGNOSIS — I2510 Atherosclerotic heart disease of native coronary artery without angina pectoris: Secondary | ICD-10-CM | POA: Diagnosis not present

## 2015-03-23 DIAGNOSIS — Z992 Dependence on renal dialysis: Secondary | ICD-10-CM

## 2015-03-23 DIAGNOSIS — K2970 Gastritis, unspecified, without bleeding: Secondary | ICD-10-CM | POA: Diagnosis present

## 2015-03-23 DIAGNOSIS — B182 Chronic viral hepatitis C: Secondary | ICD-10-CM | POA: Diagnosis present

## 2015-03-23 DIAGNOSIS — E1142 Type 2 diabetes mellitus with diabetic polyneuropathy: Secondary | ICD-10-CM | POA: Diagnosis present

## 2015-03-23 DIAGNOSIS — K7469 Other cirrhosis of liver: Secondary | ICD-10-CM | POA: Diagnosis not present

## 2015-03-23 DIAGNOSIS — E1122 Type 2 diabetes mellitus with diabetic chronic kidney disease: Secondary | ICD-10-CM | POA: Diagnosis present

## 2015-03-23 DIAGNOSIS — R278 Other lack of coordination: Secondary | ICD-10-CM | POA: Diagnosis present

## 2015-03-23 DIAGNOSIS — Z87891 Personal history of nicotine dependence: Secondary | ICD-10-CM

## 2015-03-23 DIAGNOSIS — K766 Portal hypertension: Secondary | ICD-10-CM | POA: Diagnosis present

## 2015-03-23 DIAGNOSIS — I120 Hypertensive chronic kidney disease with stage 5 chronic kidney disease or end stage renal disease: Secondary | ICD-10-CM | POA: Diagnosis present

## 2015-03-23 DIAGNOSIS — R0600 Dyspnea, unspecified: Secondary | ICD-10-CM | POA: Diagnosis present

## 2015-03-23 DIAGNOSIS — K921 Melena: Secondary | ICD-10-CM | POA: Diagnosis present

## 2015-03-23 DIAGNOSIS — K922 Gastrointestinal hemorrhage, unspecified: Secondary | ICD-10-CM | POA: Diagnosis not present

## 2015-03-23 DIAGNOSIS — E875 Hyperkalemia: Secondary | ICD-10-CM | POA: Diagnosis not present

## 2015-03-23 DIAGNOSIS — Z6822 Body mass index (BMI) 22.0-22.9, adult: Secondary | ICD-10-CM | POA: Diagnosis not present

## 2015-03-23 DIAGNOSIS — N2581 Secondary hyperparathyroidism of renal origin: Secondary | ICD-10-CM | POA: Diagnosis present

## 2015-03-23 DIAGNOSIS — R0602 Shortness of breath: Secondary | ICD-10-CM | POA: Diagnosis not present

## 2015-03-23 DIAGNOSIS — R059 Cough, unspecified: Secondary | ICD-10-CM

## 2015-03-23 DIAGNOSIS — K920 Hematemesis: Secondary | ICD-10-CM | POA: Diagnosis not present

## 2015-03-23 DIAGNOSIS — D631 Anemia in chronic kidney disease: Secondary | ICD-10-CM | POA: Diagnosis present

## 2015-03-23 DIAGNOSIS — R062 Wheezing: Secondary | ICD-10-CM | POA: Diagnosis not present

## 2015-03-23 DIAGNOSIS — I5020 Unspecified systolic (congestive) heart failure: Secondary | ICD-10-CM | POA: Diagnosis not present

## 2015-03-23 DIAGNOSIS — D62 Acute posthemorrhagic anemia: Secondary | ICD-10-CM | POA: Diagnosis present

## 2015-03-23 DIAGNOSIS — R188 Other ascites: Secondary | ICD-10-CM

## 2015-03-23 DIAGNOSIS — K7460 Unspecified cirrhosis of liver: Secondary | ICD-10-CM | POA: Diagnosis not present

## 2015-03-23 LAB — CBC
HCT: 24.4 % — ABNORMAL LOW (ref 40.0–52.0)
HEMATOCRIT: 21.9 % — AB (ref 40.0–52.0)
HEMOGLOBIN: 6.7 g/dL — AB (ref 13.0–18.0)
Hemoglobin: 7.7 g/dL — ABNORMAL LOW (ref 13.0–18.0)
MCH: 30 pg (ref 26.0–34.0)
MCH: 28.7 pg (ref 26.0–34.0)
MCHC: 31.6 g/dL — ABNORMAL LOW (ref 32.0–36.0)
MCHC: 30.5 g/dL — ABNORMAL LOW (ref 32.0–36.0)
MCV: 94.8 fL (ref 80.0–100.0)
MCV: 93.9 fL (ref 80.0–100.0)
PLATELETS: 289 10*3/uL (ref 150–440)
Platelets: 237 10*3/uL (ref 150–440)
RBC: 2.58 MIL/uL — AB (ref 4.40–5.90)
RBC: 2.33 MIL/uL — ABNORMAL LOW (ref 4.40–5.90)
RDW: 20.3 % — AB (ref 11.5–14.5)
RDW: 19.6 % — ABNORMAL HIGH (ref 11.5–14.5)
WBC: 8.7 10*3/uL (ref 3.8–10.6)
WBC: 7.1 10*3/uL (ref 3.8–10.6)

## 2015-03-23 LAB — FOLATE: Folate: 28 ng/mL (ref >5.9)

## 2015-03-23 LAB — PROTIME-INR
INR: 1.23
Prothrombin Time: 15.7 seconds — ABNORMAL HIGH (ref 11.4–15.0)

## 2015-03-23 LAB — PREPARE RBC (CROSSMATCH)

## 2015-03-23 LAB — IRON AND TIBC
Iron: 24 ug/dL — ABNORMAL LOW (ref 45–182)
Saturation Ratios: 6 % — ABNORMAL LOW (ref 17.9–39.5)
TIBC: 379 ug/dL (ref 250–450)
UIBC: 355 ug/dL

## 2015-03-23 LAB — COMPREHENSIVE METABOLIC PANEL
ALBUMIN: 3.7 g/dL (ref 3.5–5.0)
ALT: 28 U/L (ref 17–63)
ANION GAP: 10 (ref 5–15)
AST: 37 U/L (ref 15–41)
Alkaline Phosphatase: 181 U/L — ABNORMAL HIGH (ref 38–126)
BILIRUBIN TOTAL: 0.9 mg/dL (ref 0.3–1.2)
BUN: 72 mg/dL — AB (ref 6–20)
CHLORIDE: 94 mmol/L — AB (ref 101–111)
CO2: 35 mmol/L — AB (ref 22–32)
Calcium: 12.8 mg/dL — ABNORMAL HIGH (ref 8.9–10.3)
Creatinine, Ser: 10.17 mg/dL — ABNORMAL HIGH (ref 0.61–1.24)
GFR calc Af Amer: 6 mL/min — ABNORMAL LOW (ref >60)
GFR calc non Af Amer: 5 mL/min — ABNORMAL LOW (ref >60)
GLUCOSE: 98 mg/dL (ref 65–99)
POTASSIUM: 5.8 mmol/L — AB (ref 3.5–5.1)
SODIUM: 139 mmol/L (ref 135–145)
Total Protein: 7.7 g/dL (ref 6.5–8.1)

## 2015-03-23 LAB — VITAMIN B12: Vitamin B-12: 572 pg/mL (ref 180–914)

## 2015-03-23 LAB — HEMOGLOBIN AND HEMATOCRIT, BLOOD
HEMATOCRIT: 21.9 % — AB (ref 40.0–52.0)
HEMOGLOBIN: 6.7 g/dL — AB (ref 13.0–18.0)

## 2015-03-23 LAB — RETICULOCYTES
RBC.: 2.33 MIL/uL — AB (ref 4.40–5.90)
RETIC COUNT ABSOLUTE: 121.2 10*3/uL (ref 19.0–183.0)
RETIC CT PCT: 5.2 % — AB (ref 0.4–3.1)

## 2015-03-23 LAB — FERRITIN: FERRITIN: 136 ng/mL (ref 24–336)

## 2015-03-23 LAB — TROPONIN I
TROPONIN I: 0.07 ng/mL — AB (ref <0.031)
TROPONIN I: 0.07 ng/mL — AB (ref <0.031)
Troponin I: 0.07 ng/mL — ABNORMAL HIGH (ref <0.031)

## 2015-03-23 LAB — ABO/RH: ABO/RH(D): A POS

## 2015-03-23 MED ORDER — ADULT MULTIVITAMIN W/MINERALS CH
1.0000 | ORAL_TABLET | Freq: Every day | ORAL | Status: DC
  Administered 2015-03-23 – 2015-03-24 (×2): 1 via ORAL
  Filled 2015-03-23 (×2): qty 1

## 2015-03-23 MED ORDER — CINACALCET HCL 30 MG PO TABS
30.0000 mg | ORAL_TABLET | Freq: Every day | ORAL | Status: DC
  Administered 2015-03-25 – 2015-03-27 (×2): 30 mg via ORAL
  Filled 2015-03-23 (×3): qty 1

## 2015-03-23 MED ORDER — SODIUM CHLORIDE 0.9 % IV SOLN
Freq: Once | INTRAVENOUS | Status: AC
  Administered 2015-03-25: 1000 mL via INTRAVENOUS

## 2015-03-23 MED ORDER — SODIUM CHLORIDE 0.9 % IJ SOLN
3.0000 mL | Freq: Two times a day (BID) | INTRAMUSCULAR | Status: DC
Start: 2015-03-23 — End: 2015-03-27
  Administered 2015-03-23 – 2015-03-26 (×5): 3 mL via INTRAVENOUS

## 2015-03-23 MED ORDER — ASPIRIN EC 81 MG PO TBEC: 81.0000 mg | DELAYED_RELEASE_TABLET | Freq: Every day | ORAL | Status: DC

## 2015-03-23 MED ORDER — MORPHINE SULFATE (PF) 2 MG/ML IV SOLN
1.0000 mg | INTRAVENOUS | Status: DC | PRN
  Administered 2015-03-25 – 2015-03-26 (×2): 1 mg via INTRAVENOUS
  Filled 2015-03-23 (×3): qty 1

## 2015-03-23 MED ORDER — ONDANSETRON HCL 4 MG/2ML IJ SOLN: 4.0000 mg | Freq: Four times a day (QID) | INTRAMUSCULAR | Status: DC | PRN

## 2015-03-23 MED ORDER — LABETALOL HCL 200 MG PO TABS
200.0000 mg | ORAL_TABLET | Freq: Every day | ORAL | Status: DC
Start: 2015-03-23 — End: 2015-03-27
  Administered 2015-03-23 – 2015-03-24 (×2): 200 mg via ORAL
  Filled 2015-03-23 (×2): qty 1

## 2015-03-23 MED ORDER — PANTOPRAZOLE SODIUM 40 MG IV SOLR
40.0000 mg | Freq: Two times a day (BID) | INTRAVENOUS | Status: DC
  Administered 2015-03-23 – 2015-03-26 (×6): 40 mg via INTRAVENOUS
  Filled 2015-03-23 (×7): qty 40

## 2015-03-23 MED ORDER — GABAPENTIN 300 MG PO CAPS
300.0000 mg | ORAL_CAPSULE | Freq: Every day | ORAL | Status: DC
  Administered 2015-03-23 – 2015-03-24 (×2): 300 mg via ORAL
  Filled 2015-03-23 (×2): qty 1

## 2015-03-23 MED ORDER — SODIUM CHLORIDE 0.9 % IV SOLN
50.0000 ug/h | INTRAVENOUS | Status: DC
  Administered 2015-03-23: 25 ug/h via INTRAVENOUS
  Administered 2015-03-24 – 2015-03-25 (×3): 50 ug/h via INTRAVENOUS
  Administered 2015-03-25: 25 ug/h via INTRAVENOUS
  Filled 2015-03-23 (×16): qty 1

## 2015-03-23 MED ORDER — HYDROCODONE-ACETAMINOPHEN 5-325 MG PO TABS
1.0000 | ORAL_TABLET | ORAL | Status: DC | PRN
  Administered 2015-03-23 – 2015-03-26 (×6): 1 via ORAL
  Administered 2015-03-27: 2 via ORAL
  Filled 2015-03-23 (×6): qty 1
  Filled 2015-03-23: qty 2

## 2015-03-23 MED ORDER — CALCIUM ACETATE (PHOS BINDER) 667 MG PO CAPS
2001.0000 mg | ORAL_CAPSULE | Freq: Three times a day (TID) | ORAL | Status: DC
  Administered 2015-03-23 – 2015-03-27 (×7): 2001 mg via ORAL
  Filled 2015-03-23 (×7): qty 3

## 2015-03-23 MED ORDER — PANTOPRAZOLE SODIUM 40 MG IV SOLR
80.0000 mg | Freq: Once | INTRAVENOUS | Status: AC
  Administered 2015-03-23: 80 mg via INTRAVENOUS

## 2015-03-23 MED ORDER — ACETAMINOPHEN 650 MG RE SUPP: 650.0000 mg | Freq: Four times a day (QID) | RECTAL | Status: DC | PRN

## 2015-03-23 MED ORDER — PANTOPRAZOLE SODIUM 40 MG IV SOLR
INTRAVENOUS | Status: AC
  Administered 2015-03-23: 80 mg via INTRAVENOUS
  Filled 2015-03-23: qty 80

## 2015-03-23 MED ORDER — ONDANSETRON HCL 4 MG/2ML IJ SOLN
4.0000 mg | Freq: Once | INTRAMUSCULAR | Status: AC
  Administered 2015-03-23: 4 mg via INTRAVENOUS

## 2015-03-23 MED ORDER — HYDRALAZINE HCL 20 MG/ML IJ SOLN: 10.0000 mg | Freq: Four times a day (QID) | INTRAMUSCULAR | Status: DC | PRN

## 2015-03-23 MED ORDER — ALUM & MAG HYDROXIDE-SIMETH 200-200-20 MG/5ML PO SUSP: 30.0000 mL | Freq: Four times a day (QID) | ORAL | Status: DC | PRN

## 2015-03-23 MED ORDER — ONDANSETRON HCL 4 MG/2ML IJ SOLN
INTRAMUSCULAR | Status: AC
  Administered 2015-03-23: 4 mg via INTRAVENOUS
  Filled 2015-03-23: qty 2

## 2015-03-23 MED ORDER — ATORVASTATIN CALCIUM 20 MG PO TABS
40.0000 mg | ORAL_TABLET | Freq: Every day | ORAL | Status: DC
  Administered 2015-03-23 – 2015-03-26 (×4): 40 mg via ORAL
  Filled 2015-03-23 (×4): qty 2

## 2015-03-23 MED ORDER — ONDANSETRON HCL 4 MG PO TABS: 4.0000 mg | ORAL_TABLET | Freq: Four times a day (QID) | ORAL | Status: DC | PRN

## 2015-03-23 MED ORDER — ACETAMINOPHEN 325 MG PO TABS
650.0000 mg | ORAL_TABLET | Freq: Four times a day (QID) | ORAL | Status: DC | PRN
  Administered 2015-03-23: 650 mg via ORAL

## 2015-03-23 MED ORDER — SENNOSIDES-DOCUSATE SODIUM 8.6-50 MG PO TABS: 1.0000 | ORAL_TABLET | Freq: Every evening | ORAL | Status: DC | PRN

## 2015-03-23 MED ORDER — AMLODIPINE BESYLATE 5 MG PO TABS
5.0000 mg | ORAL_TABLET | Freq: Every day | ORAL | Status: DC
  Administered 2015-03-23 – 2015-03-24 (×2): 5 mg via ORAL
  Filled 2015-03-23 (×2): qty 1

## 2015-03-23 MED ORDER — BENAZEPRIL HCL 20 MG PO TABS
10.0000 mg | ORAL_TABLET | Freq: Every day | ORAL | Status: DC
  Administered 2015-03-23 – 2015-03-24 (×2): 10 mg via ORAL
  Filled 2015-03-23 (×2): qty 1

## 2015-03-23 NOTE — ED Provider Notes (Signed)
[facility] Emergency Department Provider Note  ____________________________________________  Time seen: Approximately 6:10 AM  I have reviewed the triage vital signs and the nursing notes.   HISTORY  Chief Complaint Shortness of Breath and Edema    HPI Stanley [name] is a 55 y.o. male comes into the hospital today was too much fluid on his stomach. The patient reports that he's been feeling short of breath for 3 weeks and he was supposed to have this fluid drained multiple weeks ago. He reports that its typically done every 3 months and it was supposed be done weeks before Christmas but it was put off until January 16. The patient reports that he is too short of breath and he cannot wait anymore. The patient also reports that he's had some dark stool and some mild bloody emesis. He also reports he's had some heartburn and has not taken his heartburn medicine. The patient reports that he has pain which is 8 out of 10 in intensity. He typically gets dialysis on Tuesdays Thursdays and Saturdays but due to the weather he missed his dialysis on Saturday. The patient is here for evaluation and treatment.   Past Medical History  Diagnosis Date  . Coronary artery disease   . Hypertension   . GERD (gastroesophageal reflux disease)   . Suicidal ideation     & HOMICIDAL IDEATION --  06-16-2013   ADMITTED TO BEHAVIOR HEALTH  . Alcohol abuse   . Drug abuse   . Diabetic peripheral neuropathy ([place])   . End stage renal disease on dialysis [facility]) NEPHROLOGIST-   DR [facility]  [place]    HEMODIALYSIS --   TUES/  THURS/  SAT    Patient Active Problem List   Diagnosis Date Noted  . GIB (gastrointestinal bleeding) 03/23/2015  . Homicidal ideation 06/19/2013  . Suicidal intent 06/19/2013  . Homicidal ideations 06/19/2013  . Hyperkalemia 06/16/2013  . Mandible fracture ([place]) 06/05/2013  . Fracture, mandible ([place]) 06/02/2013  . Coronary atherosclerosis of native  coronary artery 06/02/2013  . ESRD on dialysis ([place]) 06/02/2013  . Mandible open fracture ([place]) 06/02/2013    Past Surgical History  Procedure Laterality Date  . Dialysis fistula creation  LAST SURGERY  APPOX  2008  . Orif mandibular fracture N/A 06/05/2013    Procedure: REPAIR OF MANDIBULAR FRACTURE x 2 with maxillo-mandibular fixation ;  Surgeon: Theodoro Kos, DO;  Location: [place];  Service: Plastics;  Laterality: N/A;  . Coronary artery bypass graft  2008  ([place] , [place])    3 VESSEL  . Coronary angioplasty  ?    PT UNABLE TO TELL IF  BEFORE OR AFTER  CABG  . Mandibular hardware removal N/A 07/29/2013    Procedure: REMOVAL OF ARCH BARS;  Surgeon: Theodoro Kos, DO;  Location: [place];  Service: Plastics;  Laterality: N/A;    Current Outpatient Rx  Name  Route  Sig  Dispense  Refill  . amLODipine (NORVASC) 5 MG tablet   Oral   Take 5 mg by mouth daily.          Marland Kitchen aspirin EC 81 MG tablet   Oral   Take 81 mg by mouth daily.         Marland Kitchen atorvastatin (LIPITOR) 40 MG tablet   Oral   Take 40 mg by mouth daily.         . benazepril (LOTENSIN) 10 MG tablet   Oral   Take 10 mg by  mouth daily.         . calcium acetate (PHOSLO) 667 MG capsule   Oral   Take 2,001 mg by mouth 3 (three) times daily with meals.         . cinacalcet (SENSIPAR) 30 MG tablet   Oral   Take 1 tablet by mouth daily.         Marland Kitchen gabapentin (NEURONTIN) 300 MG capsule   Oral   Take 300 mg by mouth daily.          Marland Kitchen labetalol (NORMODYNE) 200 MG tablet   Oral   Take 200 mg by mouth daily.          . Multiple Vitamins-Minerals (MULTIVITAMIN WITH MINERALS) tablet   Oral   Take 1 tablet by mouth daily.         Marland Kitchen omeprazole (PRILOSEC) 20 MG capsule   Oral   Take 20 mg by mouth every morning.            Allergies Review of patient's allergies indicates no known allergies.  Family History  Problem Relation Age of Onset  . Family history unknown: Yes    Social  History Social History  Substance Use Topics  . Smoking status: Former Smoker -- 0.00 packs/day for 40 years    Types: Cigarettes  . Smokeless tobacco: Never Used  . Alcohol Use: No    Review of Systems Constitutional: No fever/chills Eyes: No visual changes. ENT: No sore throat. Cardiovascular: Denies chest pain. Respiratory:  shortness of breath. Gastrointestinal: abdominal pain and abdominal distension Genitourinary: Negative for dysuria. Musculoskeletal: Negative for back pain. Skin: Negative for rash. Neurological: Negative for headaches, focal weakness or numbness.  10-point ROS otherwise negative.  ____________________________________________   PHYSICAL EXAM:  VITAL SIGNS: ED Triage Vitals  Enc Vitals Group     BP 03/23/15 0524 167/99 mmHg     Pulse Rate 03/23/15 0524 100     Resp 03/23/15 0524 33     Temp 03/23/15 0524 98.3 F (36.8 C)     Temp Source 03/23/15 0524 Oral     SpO2 03/23/15 0521 93 %     Weight 03/23/15 0524 185 lb (83.915 kg)     Height 03/23/15 0524 6\' 3"  (1.905 m)     Head Cir --      Peak Flow --      Pain Score 03/23/15 0524 9     Pain Loc --      Pain Edu? --      Excl. in [place]? --     Constitutional: Alert and oriented. Ill appearing and in molderate distress. Eyes: Conjunctivae are normal. PERRL. EOMI. Head: Atraumatic. Nose: No congestion/rhinnorhea. Mouth/Throat: Mucous membranes are moist.  Oropharynx non-erythematous. Cardiovascular: Normal rate, regular rhythm. Grossly normal heart sounds.  Good peripheral circulation. Respiratory: Normal respiratory effort.  No retractions. Lungs CTAB. Gastrointestinal: Soft with mild diffuse tenderness to palpation, distention, positive bowel sounds  Rectal: Dark-appearing stool heme positive control positive Musculoskeletal: No lower extremity tenderness nor edema.   Neurologic:  Normal speech and language.  Skin:  Skin is warm, dry and intact.  Psychiatric: Mood and affect are normal.    ____________________________________________   LABS (all labs ordered are listed, but only abnormal results are displayed)  Labs Reviewed  CBC - Abnormal; Notable for the following:    RBC 2.58 (*)    Hemoglobin 7.7 (*)    HCT 24.4 (*)    MCHC 31.6 (*)    RDW  20.3 (*)    All other components within normal limits  TROPONIN I - Abnormal; Notable for the following:    Troponin I 0.07 (*)    All other components within normal limits  COMPREHENSIVE METABOLIC PANEL - Abnormal; Notable for the following:    Potassium 5.8 (*)    Chloride 94 (*)    CO2 35 (*)    BUN 72 (*)    Creatinine, Ser 10.17 (*)    Calcium 12.8 (*)    Alkaline Phosphatase 181 (*)    GFR calc non Af Amer 5 (*)    GFR calc Af Amer 6 (*)    All other components within normal limits  TYPE AND SCREEN   ____________________________________________  EKG  ED ECG REPORT I, Loney Hering, the attending physician, personally viewed and interpreted this ECG.   Date: 03/23/2015  EKG Time: 529  Rate: 100  Rhythm: sinus tachycardia  Axis: normal  Intervals:none  ST&T Change: flipped t waves in leads III and avf  ____________________________________________  RADIOLOGY  CXR: Lower lung volumes with chronic but increased interstitial markings favored due to pulmonary vascular congestion, stable cardiomegaly, no pleural effusion ____________________________________________   PROCEDURES  Procedure(s) performed: None  Critical Care performed: No  ____________________________________________   INITIAL IMPRESSION / ASSESSMENT AND PLAN / ED COURSE  Pertinent labs & imaging results that were available during my care of the patient were reviewed by me and considered in my medical decision making (see chart for details).  This is a 55 year old male with a history of end-stage renal disease on dialysis as well as ascites who comes in today with some abdominal swelling and shortness of breath. The patient  reports that he's been feeling more short of breath over the past 3 weeks and they have not drained his ascites. The patient also has missed dialysis. At this time I am awaiting the results of his CMP and his troponin as well as his chest x-ray. I will reassess the patient once I received the results. The patient does have a hemoglobin and hematocrit that is lower than normal for him. I will give the patient dose of Protonix as well as Zofran.  I performed a guaiac on the patient was found to be heme positive. The patient appears to be having a GI bleed as well causing his decreased hemoglobin and hematocrit. The patient does need to get some dialysis but he will be admitted to the hospitalist service. I will consent the patient for a unit of blood and also contact nephrology for admission. ____________________________________________   FINAL CLINICAL IMPRESSION(S) / ED DIAGNOSES  Final diagnoses:  Gastrointestinal hemorrhage, unspecified gastritis, unspecified gastrointestinal hemorrhage type  Dyspnea      Loney Hering, MD 03/23/15 0840

## 2015-03-23 NOTE — Progress Notes (Signed)
Hemodialysis tx start 

## 2015-03-23 NOTE — Progress Notes (Signed)
Pre-hd tx 

## 2015-03-23 NOTE — Progress Notes (Signed)
Patient was scheduled for US paracentesis on Friday-03/20/15. No showed for appointment.

## 2015-03-23 NOTE — Progress Notes (Signed)
Subjective:  Patient known to our practice from outpatient hemodialysis Currently he is followed by Dr. Holley Raring at [facility] dialysis center He presents for shortness of breath,  Rectal bleeding,  missing dialysis treatment on Saturday In the ER, he is noted to have severe anemia with hemoglobin of 6.7 Potassium is high at 5.8 Urgent dialysis as requested for shortness of breath   Objective:  Vital signs in last 24 hours:  Temp:  [97.7 F (36.5 C)-98.3 F (36.8 C)] 97.7 F (36.5 C) (01/09 1630) Pulse Rate:  [81-100] 81 (01/09 1713) Resp:  [14-33] 18 (01/09 1713) BP: (115-174)/(63-107) 129/73 mmHg (01/09 1713) SpO2:  [93 %-100 %] 100 % (01/09 1713) Weight:  [83 kg (182 lb 15.7 oz)-84.4 kg (186 lb 1.1 oz)] 83 kg (182 lb 15.7 oz) (01/09 1630)  Weight change:  Filed Weights   03/23/15 0524 03/23/15 1235 03/23/15 1630  Weight: 83.915 kg (185 lb) 84.4 kg (186 lb 1.1 oz) 83 kg (182 lb 15.7 oz)    Intake/Output:    Intake/Output Summary (Last 24 hours) at 03/23/15 1731 Last data filed at 03/23/15 1630  Gross per 24 hour  Intake    310 ml  Output   1800 ml  Net  -1490 ml     Physical Exam: General:  alert, chronically ill-appearing   HEENT  moist oral mucous membranes   Neck  supple   Pulm/lungs  normal respiratory effort, scattered rhonchi   CVS/Heart  regular rhythm, no rub or gallop   Abdomen:   distended from ascites   Extremities: + peripheral edema   Neurologic:  alert, nonfocal   Skin:  no acute rashes   Access:  AV fistula        Basic Metabolic Panel:   Recent Labs Lab 03/23/15 0528  NA 139  K 5.8*  CL 94*  CO2 35*  GLUCOSE 98  BUN 72*  CREATININE 10.17*  CALCIUM 12.8*     CBC:  Recent Labs Lab 03/23/15 0528 03/23/15 1136  WBC 8.7 7.1  HGB 7.7* 6.7*  6.7*  HCT 24.4* 21.9*  21.9*  MCV 94.8 93.9  PLT 289 237      Microbiology:  No results found for this or any previous visit (from the past 720 hour(s)).  Coagulation  Studies:  Recent Labs  03/23/15 1136  LABPROT 15.7*  INR 1.23    Urinalysis: No results for input(s): COLORURINE, LABSPEC, PHURINE, GLUCOSEU, HGBUR, BILIRUBINUR, KETONESUR, PROTEINUR, UROBILINOGEN, NITRITE, LEUKOCYTESUR in the last 72 hours.  Invalid input(s): APPERANCEUR    Imaging: Dg Chest 2 View  03/23/2015  CLINICAL DATA:  55 year old male with cirrhosis. Scheduled for upcoming paracentesis. Increased abdominal distention and shortness of breath today. Initial encounter. EXAM: CHEST  2 VIEW COMPARISON:  03/28/2014 and earlier FINDINGS: Seated AP and lateral views of the chest. Sequelae of CABG. Lower lung volumes. Stable cardiomegaly and mediastinal contours. No pleural effusion or pneumothorax. No consolidation. Chronic but increased interstitial markings in both lungs. Visualized tracheal air column is within normal limits. No acute osseous abnormality identified. No pneumoperitoneum. IMPRESSION: Lower lung volumes with chronic but increased interstitial markings favor due to pulmonary vascular congestion. Stable cardiomegaly, no pleural effusion. Electronically Signed   By: Genevie Ann M.D.   On: 03/23/2015 07:20     Medications:   . octreotide  (SANDOSTATIN)    IV infusion 25 mcg/hr (03/23/15 1221)   . sodium chloride   Intravenous Once  . amLODipine  5 mg Oral Daily  .  atorvastatin  40 mg Oral QPC supper  . benazepril  10 mg Oral Daily  . calcium acetate  2,001 mg Oral TID WC  . cinacalcet  30 mg Oral Q breakfast  . gabapentin  300 mg Oral Daily  . labetalol  200 mg Oral Daily  . multivitamin with minerals  1 tablet Oral Daily  . pantoprazole (PROTONIX) IV  40 mg Intravenous Q12H  . sodium chloride  3 mL Intravenous Q12H   acetaminophen **OR** acetaminophen, alum & mag hydroxide-simeth, hydrALAZINE, HYDROcodone-acetaminophen, morphine injection, ondansetron **OR** ondansetron (ZOFRAN) IV, senna-docusate  Assessment/ Plan:  55 y.o. male with end-stage renal disease,  dialysis at [facility], coronary disease, hypertension, history of alcohol abuse, history of drug abuse,   1. End-stage renal disease - Urgent dialysis done today for hyperkalemia 2. Ascites from liver cirrhosis - Plan for ultrasound-guided paracentesis later today 3. Secondary hyperparathyroidism Continue PhosLo and Sensipar once he resumes normal by mouth intake 4. Severe anemia of chronic kidney disease and blood loss Continue Procrit Blood transfusion given during dialysis    LOS: 0 Stanley [name] 1/9/20175:31 PM

## 2015-03-23 NOTE — Progress Notes (Addendum)
Notified Dr. Benjie Karvonen of Troponin .07 as well as hemoglobin of 6.7. Per Dr. Benjie Karvonen place order for 1 unit PRBCs. Per Dr. Benjie Karvonen 1 unit of blood was suppose to be transfuses in ED.

## 2015-03-23 NOTE — Progress Notes (Signed)
Per Dr. Gustavo Lah place order for PT/INR as well as CBC

## 2015-03-23 NOTE — Progress Notes (Signed)
Hemodialysis completed. 

## 2015-03-23 NOTE — Progress Notes (Signed)
Per Dr. Benjie Karvonen okay to place patient on clear liquid diet and NPO after midnight.

## 2015-03-23 NOTE — ED Notes (Signed)
Patient to ER for c/o abdominal distention/edema. States he has h/o cirrhosis and is scheduled on 1/16 to have fluid removed, but is unable to tolerate waiting d/t weakness and shortness of breath. Patient was 93% on RA for EMS. Patient denies wearing home O2 at any time.

## 2015-03-23 NOTE — H&P (Signed)
[place] [facility] [place] NAME: Stanley [name]    MR#:  [id number]  DATE OF BIRTH:  [birth year]  DATE OF ADMISSION:  03/23/2015  PRIMARY CARE PHYSICIAN: [facility]   REQUESTING/REFERRING PHYSICIAN:  Dr. Dahlia Client  CHIEF COMPLAINT:  Shortness of breath HISTORY OF PRESENT ILLNESS:  Stanley [name]  is a 55 y.o. male with a known history of liver cirrhosis with ascites and end-stage renal disease on hemodialysis who presents with above complaint. Patient missed dialysis on Saturday due to the ankle that weather. He is complaining of increasing shortness of breath. He is also complaining of weakness. In the emergency room CBC was performed which showed a hemoglobin of 7.4. He was also guaiac positive. He is also stating that he has had dark-colored stools, however this is been since he was diagnosed with liver cirrhosis. He denies hematemesis or hematochezia. He says that he is supposed to have a paracentesis on January 16 and needs this today. Patient denies chest pain. Patient has consented for 1 unit of PRBCs in the emergency room.  PAST MEDICAL HISTORY:   Past Medical History  Diagnosis Date  . Coronary artery disease   . Hypertension   . GERD (gastroesophageal reflux disease)   . Suicidal ideation     & HOMICIDAL IDEATION --  06-16-2013   ADMITTED TO BEHAVIOR HEALTH  . Alcohol abuse   . Drug abuse   . Diabetic peripheral neuropathy ([place])   . End stage renal disease on dialysis [facility]) NEPHROLOGIST-   DR [facility]  [place]    HEMODIALYSIS --   TUES/  THURS/  SAT    PAST SURGICAL HISTORY:   Past Surgical History  Procedure Laterality Date  . Dialysis fistula creation  LAST SURGERY  APPOX  2008  . Orif mandibular fracture N/A 06/05/2013    Procedure: REPAIR OF MANDIBULAR FRACTURE x 2 with maxillo-mandibular fixation ;  Surgeon: Theodoro Kos, DO;  Location: [place];  Service: Plastics;  Laterality: N/A;  . Coronary artery bypass  graft  2008  ([place] , [place])    3 VESSEL  . Coronary angioplasty  ?    PT UNABLE TO TELL IF  BEFORE OR AFTER  CABG  . Mandibular hardware removal N/A 07/29/2013    Procedure: REMOVAL OF ARCH BARS;  Surgeon: Theodoro Kos, DO;  Location: [place];  Service: Plastics;  Laterality: N/A;    SOCIAL HISTORY:   Social History  Substance Use Topics  . Smoking status: Former Smoker -- 0.00 packs/day for 40 years    Types: Cigarettes  . Smokeless tobacco: Never Used  . Alcohol Use: No    FAMILY HISTORY:   Positive history of EtOH no history of hypertension-positive CAD  DRUG ALLERGIES:  No Known Allergies   REVIEW OF SYSTEMS:  CONSTITUTIONAL: No fever, fatigue or weakness.  EYES: No blurred or double vision.  EARS, NOSE, AND THROAT: No tinnitus or ear pain.  RESPIRATORY: No cough, positive shortness of breath, no wheezing or hemoptysis.  CARDIOVASCULAR: No chest pain, positive orthopnea, edema.  GASTROINTESTINAL: No nausea, vomiting, diarrhea or abdominal pain. Positive ascites GENITOURINARY: No dysuria, hematuria.  ENDOCRINE: No polyuria, nocturia,  HEMATOLOGY: No anemia, easy bruising or bleeding SKIN: No rash or lesion. MUSCULOSKELETAL: No joint pain or arthritis.   NEUROLOGIC: No tingling, numbness, weakness.  PSYCHIATRY: No anxiety or depression.   MEDICATIONS AT HOME:   Prior to Admission medications   Medication Sig Start Date End  Date Taking? Authorizing Provider  amLODipine (NORVASC) 5 MG tablet Take 5 mg by mouth daily.     Historical Provider, MD  aspirin EC 81 MG tablet Take 81 mg by mouth daily.    Historical Provider, MD  atorvastatin (LIPITOR) 40 MG tablet Take 40 mg by mouth daily.    Historical Provider, MD  benazepril (LOTENSIN) 10 MG tablet Take 10 mg by mouth daily.    Historical Provider, MD  calcium acetate (PHOSLO) 667 MG capsule Take 2,001 mg by mouth 3 (three) times daily with meals.    Historical Provider, MD  cinacalcet (SENSIPAR) 30  MG tablet Take 1 tablet by mouth daily.    Historical Provider, MD  gabapentin (NEURONTIN) 300 MG capsule Take 300 mg by mouth daily.     Historical Provider, MD  labetalol (NORMODYNE) 200 MG tablet Take 200 mg by mouth daily.     Historical Provider, MD  Multiple Vitamins-Minerals (MULTIVITAMIN WITH MINERALS) tablet Take 1 tablet by mouth daily.    Historical Provider, MD  omeprazole (PRILOSEC) 20 MG capsule Take 20 mg by mouth every morning.     Historical Provider, MD      VITAL SIGNS:  Blood pressure 139/71, pulse 87, temperature 98.1 F (36.7 C), temperature source Oral, resp. rate 18, height 6\' 3"  (1.905 m), weight 83.915 kg (185 lb), SpO2 100 %.  PHYSICAL EXAMINATION:  GENERAL:  55 y.o.-year-old patient lying in the bed with no acute distress.  EYES: Pupils equal, round, reactive to light and accommodation. No scleral icterus. Extraocular muscles intact.  HEENT: Head atraumatic, normocephalic. Oropharynx and nasopharynx clear.  NECK:  Supple, no jugular venous distention. No thyroid enlargement, no tenderness.  LUNGS: Patient has bilateral crackles without increased respiratory effort.Marland Kitchen  CARDIOVASCULAR: S1, S2 normal. No murmurs, rubs, or gallops.  ABDOMEN: Soft, nontender, patient does have a fluid wave. Bowel sounds present. No organomegaly or mass.  EXTREMITIES: No pedal edema, cyanosis, or clubbing.  NEUROLOGIC: Cranial nerves II through XII are grossly intact. No focal deficits. PSYCHIATRIC: The patient is alert and oriented x 3.  SKIN: No obvious rash, lesion, or ulcer.   LABORATORY PANEL:   CBC  Recent Labs Lab 03/23/15 0528  WBC 8.7  HGB 7.7*  HCT 24.4*  PLT 289   ------------------------------------------------------------------------------------------------------------------  Chemistries   Recent Labs Lab 03/23/15 0528  NA 139  K 5.8*  CL 94*  CO2 35*  GLUCOSE 98  BUN 72*  CREATININE 10.17*  CALCIUM 12.8*  AST 37  ALT 28  ALKPHOS 181*  BILITOT  0.9   ------------------------------------------------------------------------------------------------------------------  Cardiac Enzymes  Recent Labs Lab 03/23/15 0528  TROPONINI 0.07*   ------------------------------------------------------------------------------------------------------------------  RADIOLOGY:  Dg Chest 2 View  03/23/2015  CLINICAL DATA:  55 year old male with cirrhosis. Scheduled for upcoming paracentesis. Increased abdominal distention and shortness of breath today. Initial encounter. EXAM: CHEST  2 VIEW COMPARISON:  03/28/2014 and earlier FINDINGS: Seated AP and lateral views of the chest. Sequelae of CABG. Lower lung volumes. Stable cardiomegaly and mediastinal contours. No pleural effusion or pneumothorax. No consolidation. Chronic but increased interstitial markings in both lungs. Visualized tracheal air column is within normal limits. No acute osseous abnormality identified. No pneumoperitoneum. IMPRESSION: Lower lung volumes with chronic but increased interstitial markings favor due to pulmonary vascular congestion. Stable cardiomegaly, no pleural effusion. Electronically Signed   By: Genevie Ann M.D.   On: 03/23/2015 07:20    EKG:    Sinus tachycardia with flipped T waves in leads 3 and aVF  IMPRESSION AND PLAN:   55 year old male with end-stage renal disease on hemodialysis, CAD and liver cirrhosis from EtOH abuse who presents with shortness of breath and found to have anemia with guaiac positive stools.  1. GI bleed: Patient has significant anemia along with quite positive stools and is describing dark-colored stool. Patient needs GI consultation. Due to history of liver cirrhosis I will place the patient on octreotide and Protonix. Patient will need every 6 hours hemoglobin. Avoid NSAIDs and aspirin.  2. [name]: I suspect some of his shortness of breath is coming from this as well as his anemia and ascites. Patient will need to  undergo dialysis as he missed it on Thursday. He had hyperkalemia and shortness of breath with vascular congestion on chest x-ray. Nephrology consultation has been placed.  3. Liver cirrhosis with ascites: I have ordered ultrasound-guided paracentesis.  4. CAD: Continue Norvasc, Benzapril, labetalol. Hold aspirin for now.  5. EtOH abuse: Patient is now in remission.   All the records are reviewed and case discussed with ED provider. Management plans discussed with the patient and he is in agreement.  CODE STATUS: FULL  TOTAL TIME TAKING CARE OF THIS PATIENT: 50 minutes.    Elliett Guarisco M.D on 03/23/2015 at 11:51 AM  Between 7am to 6pm - Pager - [phone] After 6pm go to [email or web address] - password [email or web address] [facility]  Office  [phone]  CC: Primary care physician; [place]

## 2015-03-23 NOTE — Consult Note (Signed)
GI Inpatient Consult Note Lollie Sails MD  Reason for Consult: GI bleed, ascites.   Attending Requesting Consult: Dr. Bettey Costa  Outpatient Primary Physician: Murlean Iba, MD  History of Present Illness: Stanley [name] is a 55 y.o. male who presented to the emergency room earlier today with complaint of shortness of breath and difficulty breathing. He has a history of cirrhosis of the liver and is paracentesis dependent in regards to recurrent ascites. He also has end-stage renal disease on hemodialysis, and he missed his last dialysis on Saturday.  He was found to be Hemoccult positive and anemic on admission. She has a history of cirrhosis of the liver that is alcohol related however he also has a history of chronic hepatitis C. He states that he had an episode of nausea with emesis last night producing some red hematemesis. His not had a repeat of that since. H that he will have episodes of coughing that then produced emesis later during several times over the period past 7 months. He also has a history of seeing some black stools relatively more frequently. He is not on oral iron. He is given IV iron when he is at hemodialysis. He underwent hemodialysis earlier today and was transfused 1 unit of packed red cells.  Outside of the above episodes he denies any other nausea or vomiting the abdomen gets uncomfortable when his ascites is more tense. He does have some heartburn but no dysphagia. He is supposed be taking omeprazole ran out about a month ago. He has a daily bowel movement. He states also that he has seen some bright red blood on the toilet paper and at times enough to see in the toilet bowl. Does take Alka-Seltzer's occasionally but denies other aspirin or NSAID products.  He has had multiple paracentesis over the past year for refractive ascites. He has an appointment this coming week to see interventional radiology, I believe at [facility], this for evaluation for  possible TIPS. He had an EGD and colonoscopy on 04/11/2014 for complaint of melena and iron deficiency anemia. The EGD was normal without evidence of esophageal varices, there is a polyp removed from the transverse colon that being tubular adenoma, no obvious cause for the melena.  He received a unit of blood today and has had no hematemesis or bowel movement today.  Liver history-patient has a long history of alcohol abuse up to 2/5 of alcohol daily however he has not had any alcohol for about 8 months currently. He does have tattoos one of these placed about 36 years ago the other one in 2005. He has a known history of chronic hepatitis C, he has never been in [employer], no foreign travel, there is a history of intranasal cocaine use about 35 years ago. His had no previous blood transfusion today being his first. There is no known family history of liver disease.  Past Medical History:  Past Medical History  Diagnosis Date  . Coronary artery disease   . Hypertension   . GERD (gastroesophageal reflux disease)   . Suicidal ideation     & HOMICIDAL IDEATION --  06-16-2013   ADMITTED TO BEHAVIOR HEALTH  . Alcohol abuse   . Drug abuse   . Diabetic peripheral neuropathy ([place])   . End stage renal disease on dialysis [facility]) NEPHROLOGIST-   [facility]  [place]    HEMODIALYSIS --   TUES/  THURS/  SAT    Problem List: Patient Active Problem List  Diagnosis Date Noted  . GIB (gastrointestinal bleeding) 03/23/2015  . Homicidal ideation 06/19/2013  . Suicidal intent 06/19/2013  . Homicidal ideations 06/19/2013  . Hyperkalemia 06/16/2013  . Mandible fracture ([place]) 06/05/2013  . Fracture, mandible ([place]) 06/02/2013  . Coronary atherosclerosis of native coronary artery 06/02/2013  . ESRD on dialysis ([place]) 06/02/2013  . Mandible open fracture ([place]) 06/02/2013    Past Surgical History: Past Surgical History  Procedure Laterality Date  . Dialysis fistula creation  LAST SURGERY  APPOX  2008  .  Orif mandibular fracture N/A 06/05/2013    Procedure: REPAIR OF MANDIBULAR FRACTURE x 2 with maxillo-mandibular fixation ;  Surgeon: Theodoro Kos, DO;  Location: [place];  Service: Plastics;  Laterality: N/A;  . Coronary artery bypass graft  2008  ([place] , [place])    3 VESSEL  . Coronary angioplasty  ?    PT UNABLE TO TELL IF  BEFORE OR AFTER  CABG  . Mandibular hardware removal N/A 07/29/2013    Procedure: REMOVAL OF ARCH BARS;  Surgeon: Theodoro Kos, DO;  Location: [place];  Service: Plastics;  Laterality: N/A;    Allergies: No Known Allergies  Home Medications: Prescriptions prior to admission  Medication Sig Dispense Refill Last Dose  . amLODipine (NORVASC) 5 MG tablet Take 5 mg by mouth daily.    12/25/2014 at Unknown time  . aspirin EC 81 MG tablet Take 81 mg by mouth daily.   12/25/2014 at Unknown time  . atorvastatin (LIPITOR) 40 MG tablet Take 40 mg by mouth daily.   12/25/2014 at Unknown time  . benazepril (LOTENSIN) 10 MG tablet Take 10 mg by mouth daily.   12/25/2014 at Unknown time  . calcium acetate (PHOSLO) 667 MG capsule Take 2,001 mg by mouth 3 (three) times daily with meals.   12/25/2014 at Unknown time  . cinacalcet (SENSIPAR) 30 MG tablet Take 1 tablet by mouth daily.     Marland Kitchen gabapentin (NEURONTIN) 300 MG capsule Take 300 mg by mouth daily.    12/25/2014 at Unknown time  . labetalol (NORMODYNE) 200 MG tablet Take 200 mg by mouth daily.    12/25/2014 at Unknown time  . Multiple Vitamins-Minerals (MULTIVITAMIN WITH MINERALS) tablet Take 1 tablet by mouth daily.   12/25/2014 at Unknown time  . omeprazole (PRILOSEC) 20 MG capsule Take 20 mg by mouth every morning.    12/25/2014 at Unknown time   Home medication reconciliation was completed with the patient.   Scheduled Inpatient Medications:   . sodium chloride   Intravenous Once  . amLODipine  5 mg Oral Daily  . atorvastatin  40 mg Oral QPC supper  . benazepril  10 mg Oral Daily  . calcium acetate   2,001 mg Oral TID WC  . cinacalcet  30 mg Oral Q breakfast  . gabapentin  300 mg Oral Daily  . labetalol  200 mg Oral Daily  . multivitamin with minerals  1 tablet Oral Daily  . pantoprazole (PROTONIX) IV  40 mg Intravenous Q12H  . sodium chloride  3 mL Intravenous Q12H    Continuous Inpatient Infusions:   . octreotide  (SANDOSTATIN)    IV infusion 25 mcg/hr (03/23/15 1221)    PRN Inpatient Medications:  acetaminophen **OR** acetaminophen, alum & mag hydroxide-simeth, hydrALAZINE, HYDROcodone-acetaminophen, morphine injection, ondansetron **OR** ondansetron (ZOFRAN) IV, senna-docusate  Family History: Family history is unknown by patient.   GI Family History: Negative for colorectal cancer liver disease or ulcers  Social History:  reports that he has quit smoking. His smoking use included Cigarettes. He smoked 0.00 packs per day for 40 years. He has never used smokeless tobacco. He reports that he does not drink alcohol or use illicit drugs.   ROS  Review of Systems: 10 systems reviewed per admission history and physical agree with same.  Physical Examination: BP 129/73 mmHg  Pulse 81  Temp(Src) 97.7 F (36.5 C) (Oral)  Resp 18  Ht 6\' 3"  (1.905 m)  Wt 83 kg (182 lb 15.7 oz)  BMI 22.87 kg/m2  SpO2 66% Gen: 55 year old male no distress. HEENT: Normocephalic atraumatic eyes are anicteric Neck: No JVD Chest: Bilaterally clear to auscultation CV: Regular rate and rhythm without rub or gallop Abd: Tense ascites mild discomfort on mild palpation and percussion. There is no rebound bowel sounds are positive. Ext: No clubbing cyanosis or edema Skin: Warm and dry Other:  Data: Lab Results  Component Value Date   WBC 7.1 03/23/2015   HGB 6.7* 03/23/2015   HGB 6.7* 03/23/2015   HCT 21.9* 03/23/2015   HCT 21.9* 03/23/2015   MCV 93.9 03/23/2015   PLT 237 03/23/2015    Recent Labs Lab 03/23/15 0528 03/23/15 1136  HGB 7.7* 6.7*  6.7*   Lab Results  Component  Value Date   NA 139 03/23/2015   K 5.8* 03/23/2015   CL 94* 03/23/2015   CO2 35* 03/23/2015   BUN 72* 03/23/2015   CREATININE 10.17* 03/23/2015   Lab Results  Component Value Date   ALT 28 03/23/2015   AST 37 03/23/2015   ALKPHOS 181* 03/23/2015   BILITOT 0.9 03/23/2015    Recent Labs Lab 03/23/15 1136  INR 1.23   CBC Latest Ref Rng 03/23/2015 03/23/2015 03/23/2015  WBC 3.8 - 10.6 K/uL 7.1 - 8.7  Hemoglobin 13.0 - 18.0 g/dL 6.7(L) 6.7(L) 7.7(L)  Hematocrit 40.0 - 52.0 % 21.9(L) 21.9(L) 24.4(L)  Platelets 150 - 440 K/uL 237 - 289    STUDIES: Dg Chest 2 View  03/23/2015  CLINICAL DATA:  55 year old male with cirrhosis. Scheduled for upcoming paracentesis. Increased abdominal distention and shortness of breath today. Initial encounter. EXAM: CHEST  2 VIEW COMPARISON:  03/28/2014 and earlier FINDINGS: Seated AP and lateral views of the chest. Sequelae of CABG. Lower lung volumes. Stable cardiomegaly and mediastinal contours. No pleural effusion or pneumothorax. No consolidation. Chronic but increased interstitial markings in both lungs. Visualized tracheal air column is within normal limits. No acute osseous abnormality identified. No pneumoperitoneum. IMPRESSION: Lower lung volumes with chronic but increased interstitial markings favor due to pulmonary vascular congestion. Stable cardiomegaly, no pleural effusion. Electronically Signed   By: Genevie Ann M.D.   On: 03/23/2015 07:20   @IMAGES @  Assessment: 1. Child Pugh class B cirrhosis of the liver with mild quiet colopathy, refractive ascites secondary to combination of his cirrhosis and end-stage renal disease. Unable to be diuresed with medications. He is paracentesis dependent. There are plans for him to have consultation in regards to tips procedure. Of note patient has a history of alcohol abuse however he is also positive for hepatitis C. 2. [place] 3. Reported hematemesis and black stools, Hemoccult positive stool. Anemia. Patient  risk for development of peptic ulcer disease because of his chronic kidney disease and hemodialysis, he is currently not taking a proton pump inhibitor and occasionally takes aspirin products.  Recommendations: 1. EGD when clinically feasible. He is due to have his paracentesis tomorrow. I would like to wait until after he has his  paracentesis to do the EGD however the EGD will also need to be done on a day that he does not have dialysis. I have discussed the risks benefits and complications of procedures to include not limited to bleeding, infection, perforation and the risk of sedation and the patient wishes to proceed. 2. Agree with paracentesis. Recommend albumin, protein, cell count and differential, cytology be done on the paracentesis fluid. 3. Will check ammonia. Patient has some mild asterixis and may have a mild hepatic encephalopathy.  Thank you for the consult. Please call with questions or concerns.  Lollie Sails, MD  03/23/2015 7:12 PM

## 2015-03-24 ENCOUNTER — Inpatient Hospital Stay: Payer: Medicare Other

## 2015-03-24 ENCOUNTER — Encounter: Payer: Self-pay | Admitting: Anesthesiology

## 2015-03-24 ENCOUNTER — Encounter
Admission: EM | Disposition: A | Discharge status: Home / Self Care | Payer: Self-pay | Source: Home / Self Care | Attending: Internal Medicine

## 2015-03-24 ENCOUNTER — Other Ambulatory Visit: Payer: Self-pay

## 2015-03-24 LAB — ALBUMIN, FLUID (OTHER): Albumin, Fluid: 2 g/dL

## 2015-03-24 LAB — COMPREHENSIVE METABOLIC PANEL
ALK PHOS: 151 U/L — AB (ref 38–126)
ALT: 23 U/L (ref 17–63)
AST: 28 U/L (ref 15–41)
Albumin: 3.3 g/dL — ABNORMAL LOW (ref 3.5–5.0)
Anion gap: 7 (ref 5–15)
BILIRUBIN TOTAL: 1 mg/dL (ref 0.3–1.2)
BUN: 40 mg/dL — AB (ref 6–20)
CALCIUM: 9.9 mg/dL (ref 8.9–10.3)
CO2: 32 mmol/L (ref 22–32)
CREATININE: 6.68 mg/dL — AB (ref 0.61–1.24)
Chloride: 97 mmol/L — ABNORMAL LOW (ref 101–111)
GFR calc Af Amer: 10 mL/min — ABNORMAL LOW (ref >60)
GFR, EST NON AFRICAN AMERICAN: 8 mL/min — AB (ref >60)
Glucose, Bld: 138 mg/dL — ABNORMAL HIGH (ref 65–99)
Potassium: 5.7 mmol/L — ABNORMAL HIGH (ref 3.5–5.1)
Sodium: 136 mmol/L (ref 135–145)
Total Protein: 7.2 g/dL (ref 6.5–8.1)

## 2015-03-24 LAB — CBC WITH DIFFERENTIAL/PLATELET
Basophils Absolute: 0.1 10*3/uL (ref 0–0.1)
Basophils Relative: 1 %
Eosinophils Absolute: 0.1 10*3/uL (ref 0–0.7)
Eosinophils Relative: 1 %
HEMATOCRIT: 25.8 % — AB (ref 40.0–52.0)
HEMOGLOBIN: 7.9 g/dL — AB (ref 13.0–18.0)
LYMPHS ABS: 0.4 10*3/uL — AB (ref 1.0–3.6)
Lymphocytes Relative: 6 %
MCH: 29 pg (ref 26.0–34.0)
MCHC: 30.7 g/dL — AB (ref 32.0–36.0)
MCV: 94.3 fL (ref 80.0–100.0)
MONO ABS: 0.9 10*3/uL (ref 0.2–1.0)
MONOS PCT: 14 %
NEUTROS PCT: 78 %
Neutro Abs: 5.3 10*3/uL (ref 1.4–6.5)
Platelets: 227 10*3/uL (ref 150–440)
RBC: 2.73 MIL/uL — ABNORMAL LOW (ref 4.40–5.90)
RDW: 19.6 % — AB (ref 11.5–14.5)
WBC: 6.8 10*3/uL (ref 3.8–10.6)

## 2015-03-24 LAB — TYPE AND SCREEN
ABO/RH(D): A POS
Antibody Screen: NEGATIVE
Unit division: 0

## 2015-03-24 LAB — PROTIME-INR
INR: 1.22
PROTHROMBIN TIME: 15.6 s — AB (ref 11.4–15.0)

## 2015-03-24 LAB — HEMOGLOBIN A1C: HEMOGLOBIN A1C: 4.6 % (ref 4.0–6.0)

## 2015-03-24 LAB — PROTEIN, BODY FLUID: TOTAL PROTEIN, FLUID: 3.7 g/dL

## 2015-03-24 LAB — BODY FLUID CELL COUNT WITH DIFFERENTIAL
Eos, Fluid: 0 %
Lymphs, Fluid: 21 %
Monocyte-Macrophage-Serous Fluid: 78 %
Neutrophil Count, Fluid: 1 %
OTHER CELLS FL: 0 %
Total Nucleated Cell Count, Fluid: 103 cu mm

## 2015-03-24 LAB — GLUCOSE, CAPILLARY
GLUCOSE-CAPILLARY: 91 mg/dL (ref 65–99)
Glucose-Capillary: 143 mg/dL — ABNORMAL HIGH (ref 65–99)

## 2015-03-24 LAB — HEMOGLOBIN: HEMOGLOBIN: 8 g/dL — AB (ref 13.0–18.0)

## 2015-03-24 LAB — TROPONIN I: TROPONIN I: 0.07 ng/mL — AB (ref <0.031)

## 2015-03-24 LAB — HEPATITIS B SURFACE ANTIBODY, QUANTITATIVE: Hepatitis B-Post: 187.1 m[IU]/mL (ref >9.9)

## 2015-03-24 LAB — HEPATITIS B SURFACE ANTIGEN: HEP B S AG: NEGATIVE

## 2015-03-24 SURGERY — EGD (ESOPHAGOGASTRODUODENOSCOPY)
Anesthesia: General

## 2015-03-24 MED ORDER — INSULIN ASPART 100 UNIT/ML ~~LOC~~ SOLN
0.0000 [IU] | Freq: Three times a day (TID) | SUBCUTANEOUS | Status: DC
  Administered 2015-03-25 – 2015-03-27 (×3): 1 [IU] via SUBCUTANEOUS
  Filled 2015-03-24 (×3): qty 1

## 2015-03-24 MED ORDER — CEPASTAT 14.5 MG MT LOZG
1.0000 | LOZENGE | OROMUCOSAL | Status: DC | PRN
  Filled 2015-03-24 (×2): qty 9

## 2015-03-24 MED ORDER — INSULIN ASPART 100 UNIT/ML ~~LOC~~ SOLN: 0.0000 [IU] | Freq: Every day | SUBCUTANEOUS | Status: DC

## 2015-03-24 NOTE — Consult Note (Signed)
Subjective: Patient seen in dialysis for heme positive stool and anemia,  In the setting of cirrhosis.  Unable to do EGD today due to hyperkalemia needing urgent dialysis.  Patietn did have paracentesis.  Feeling weak.    Objective: Vital signs in last 24 hours: Temp:  [97.7 F (36.5 C)-99.1 F (37.3 C)] 97.9 F (36.6 C) (01/10 1809) Pulse Rate:  [67-79] 78 (01/10 1809) Resp:  [16-26] 22 (01/10 1809) BP: (98-124)/(59-79) 116/75 mmHg (01/10 1809) SpO2:  [96 %-100 %] 100 % (01/10 1600) Weight:  [82.5 kg (181 lb 14.1 oz)-83 kg (182 lb 15.7 oz)] 82.5 kg (181 lb 14.1 oz) (01/10 1809) Blood pressure 116/75, pulse 78, temperature 97.9 F (36.6 C), temperature source Oral, resp. rate 22, height 6\' 3"  (1.905 m), weight 82.5 kg (181 lb 14.1 oz), SpO2 100 %.   Intake/Output from previous day: 01/09 0701 - 01/10 0700 In: 509 [I.V.:199; Blood:310] Out: 1500   Intake/Output this shift: Total I/O In: 157 [I.V.:157] Out: 1125 [Urine:125; Other:1000]   General appearance:  18 male no distress Resp:  cta Cardio:  rrr GI:  Positive ascites, less tense, no-tender, bowel sounds positive. Extremities:     Lab Results: Results for orders placed or performed during the hospital encounter of 03/23/15 (from the past 24 hour(s))  Troponin I     Status: Abnormal   Collection Time: 03/24/15 12:43 AM  Result Value Ref Range   Troponin I 0.07 (H) <0.031 ng/mL  Comprehensive metabolic panel     Status: Abnormal   Collection Time: 03/24/15 12:43 AM  Result Value Ref Range   Sodium 136 135 - 145 mmol/L   Potassium 5.7 (H) 3.5 - 5.1 mmol/L   Chloride 97 (L) 101 - 111 mmol/L   CO2 32 22 - 32 mmol/L   Glucose, Bld 138 (H) 65 - 99 mg/dL   BUN 40 (H) 6 - 20 mg/dL   Creatinine, Ser 6.68 (H) 0.61 - 1.24 mg/dL   Calcium 9.9 8.9 - 10.3 mg/dL   Total Protein 7.2 6.5 - 8.1 g/dL   Albumin 3.3 (L) 3.5 - 5.0 g/dL   AST 28 15 - 41 U/L   ALT 23 17 - 63 U/L   Alkaline Phosphatase 151 (H) 38 - 126 U/L   Total  Bilirubin 1.0 0.3 - 1.2 mg/dL   GFR calc non Af Amer 8 (L) >60 mL/min   GFR calc Af Amer 10 (L) >60 mL/min   Anion gap 7 5 - 15  Hemoglobin     Status: Abnormal   Collection Time: 03/24/15 12:43 AM  Result Value Ref Range   Hemoglobin 8.0 (L) 13.0 - 18.0 g/dL  Hemoglobin A1c     Status: None   Collection Time: 03/24/15 12:43 AM  Result Value Ref Range   Hgb A1c MFr Bld 4.6 4.0 - 6.0 %  Protime-INR     Status: Abnormal   Collection Time: 03/24/15  5:08 AM  Result Value Ref Range   Prothrombin Time 15.6 (H) 11.4 - 15.0 seconds   INR 1.22   CBC with Differential/Platelet     Status: Abnormal   Collection Time: 03/24/15  5:08 AM  Result Value Ref Range   WBC 6.8 3.8 - 10.6 K/uL   RBC 2.73 (L) 4.40 - 5.90 MIL/uL   Hemoglobin 7.9 (L) 13.0 - 18.0 g/dL   HCT 25.8 (L) 40.0 - 52.0 %   MCV 94.3 80.0 - 100.0 fL   MCH 29.0 26.0 - 34.0 pg  MCHC 30.7 (L) 32.0 - 36.0 g/dL   RDW 19.6 (H) 11.5 - 14.5 %   Platelets 227 150 - 440 K/uL   Neutrophils Relative % 78 %   Neutro Abs 5.3 1.4 - 6.5 K/uL   Lymphocytes Relative 6 %   Lymphs Abs 0.4 (L) 1.0 - 3.6 K/uL   Monocytes Relative 14 %   Monocytes Absolute 0.9 0.2 - 1.0 K/uL   Eosinophils Relative 1 %   Eosinophils Absolute 0.1 0 - 0.7 K/uL   Basophils Relative 1 %   Basophils Absolute 0.1 0 - 0.1 K/uL  Albumin, pleural or peritoneal fluid     Status: None   Collection Time: 03/24/15  8:42 AM  Result Value Ref Range   Albumin, Fluid 2.0 g/dL   Fluid Type-FALB PERITONEAL   Protein, pleural or peritoneal fluid     Status: None   Collection Time: 03/24/15  8:45 AM  Result Value Ref Range   Total protein, fluid 3.7 g/dL   Fluid Type-FTP CYTO PERI   Body fluid cell count with differential     Status: Abnormal   Collection Time: 03/24/15  8:45 AM  Result Value Ref Range   Fluid Type-FCT PERITONEAL    Color, Fluid YELLOW YELLOW   Appearance, Fluid CLEAR (A) CLEAR   WBC, Fluid 103 cu mm   Neutrophil Count, Fluid 1 %   Lymphs, Fluid 21 %    Monocyte-Macrophage-Serous Fluid 78 %   Eos, Fluid 0 %   Other Cells, Fluid 0 %      Recent Labs  03/23/15 0528 03/23/15 1136 03/24/15 0043 03/24/15 0508  WBC 8.7 7.1  --  6.8  HGB 7.7* 6.7*  6.7* 8.0* 7.9*  HCT 24.4* 21.9*  21.9*  --  25.8*  PLT 289 237  --  227   BMET  Recent Labs  03/23/15 0528 03/24/15 0043  NA 139 136  K 5.8* 5.7*  CL 94* 97*  CO2 35* 32  GLUCOSE 98 138*  BUN 72* 40*  CREATININE 10.17* 6.68*  CALCIUM 12.8* 9.9   LFT  Recent Labs  03/24/15 0043  PROT 7.2  ALBUMIN 3.3*  AST 28  ALT 23  ALKPHOS 151*  BILITOT 1.0   PT/INR  Recent Labs  03/23/15 1136 03/24/15 0508  LABPROT 15.7* 15.6*  INR 1.23 1.22   Hepatitis Panel  Recent Labs  03/23/15 1136  HEPBSAG Negative   C-Diff No results for input(s): CDIFFTOX in the last 72 hours. No results for input(s): CDIFFPCR in the last 72 hours.   Studies/Results: Dg Chest 2 View  03/23/2015  CLINICAL DATA:  55 year old male with cirrhosis. Scheduled for upcoming paracentesis. Increased abdominal distention and shortness of breath today. Initial encounter. EXAM: CHEST  2 VIEW COMPARISON:  03/28/2014 and earlier FINDINGS: Seated AP and lateral views of the chest. Sequelae of CABG. Lower lung volumes. Stable cardiomegaly and mediastinal contours. No pleural effusion or pneumothorax. No consolidation. Chronic but increased interstitial markings in both lungs. Visualized tracheal air column is within normal limits. No acute osseous abnormality identified. No pneumoperitoneum. IMPRESSION: Lower lung volumes with chronic but increased interstitial markings favor due to pulmonary vascular congestion. Stable cardiomegaly, no pleural effusion. Electronically Signed   By: Genevie Ann M.D.   On: 03/23/2015 07:20   US Paracentesis  03/24/2015  CLINICAL DATA:  Recurrent symptomatic ascites, abdominal distention EXAM: ULTRASOUND GUIDED PARACENTESIS COMPARISON:  01/28/2015 PROCEDURE: An ultrasound guided  paracentesis was thoroughly discussed with the patient and questions answered.  The benefits, risks, alternatives and complications were also discussed. The patient understands and wishes to proceed with the procedure. Written consent was obtained. Ultrasound was performed to localize and mark an adequate pocket of fluid in the right lower quadrant of the abdomen. The area was then prepped and draped in the normal sterile fashion. 1% Lidocaine was used for local anesthesia. Under ultrasound guidance a safety centesis needle catheter was introduced. Paracentesis was performed. The catheter was removed and a dressing applied. COMPLICATIONS: None immediate FINDINGS: A total of approximately 5.6 L of clear peritoneal fluid was removed. A fluid sample was sent for laboratory analysis. IMPRESSION: Successful ultrasound guided paracentesis yielding 5.6 L of ascites. Electronically Signed   By: Jerilynn Mages.  Shick M.D.   On: 03/24/2015 09:57    Scheduled Inpatient Medications:   . sodium chloride   Intravenous Once  . amLODipine  5 mg Oral Daily  . atorvastatin  40 mg Oral QPC supper  . benazepril  10 mg Oral Daily  . calcium acetate  2,001 mg Oral TID WC  . cinacalcet  30 mg Oral Q breakfast  . gabapentin  300 mg Oral Daily  . insulin aspart  0-5 Units Subcutaneous QHS  . insulin aspart  0-9 Units Subcutaneous TID WC  . labetalol  200 mg Oral Daily  . multivitamin with minerals  1 tablet Oral Daily  . pantoprazole (PROTONIX) IV  40 mg Intravenous Q12H  . sodium chloride  3 mL Intravenous Q12H    Continuous Inpatient Infusions:   . octreotide  (SANDOSTATIN)    IV infusion 50 mcg/hr (03/24/15 0756)    PRN Inpatient Medications:  acetaminophen **OR** acetaminophen, alum & mag hydroxide-simeth, hydrALAZINE, HYDROcodone-acetaminophen, morphine injection, ondansetron **OR** ondansetron (ZOFRAN) IV, senna-docusate  Miscellaneous:   Assessment:  1) anemia, heme positive stool, reported melena.  2) ESRD/HD 3)  refractive ascites.  Patietn has appointment with IR nest week? For discussion re TIPS.   4) cirrhosis-Childs Pugh class B, SAAG consistant with portal HTN.   Plan:  1) wll plan EGD for tomorrow pm as clinically feasible. .  Continue current.    Lollie Sails MD 03/24/2015, 6:24 PM

## 2015-03-24 NOTE — Progress Notes (Signed)
[facility] NAME: Stanley [name]    MR#:  [id number]  DATE OF BIRTH:  1960/06/21  SUBJECTIVE:  Patient is doing well this morning. He still complains of increased abdominal girth. He is going for paracentesis today. He denies hematochezia or melena. He had dialysis yesterday.  REVIEW OF SYSTEMS:    Review of Systems  Constitutional: Negative for fever, chills and malaise/fatigue.  HENT: Negative for sore throat.   Eyes: Negative for blurred vision.  Respiratory: Negative for cough, hemoptysis, shortness of breath and wheezing.   Cardiovascular: Negative for chest pain, palpitations and leg swelling.  Gastrointestinal: Negative for nausea, vomiting, abdominal pain, diarrhea and blood in stool.  Genitourinary: Negative for dysuria.  Musculoskeletal: Negative for back pain.  Neurological: Negative for dizziness, tremors and headaches.  Endo/Heme/Allergies: Does not bruise/bleed easily.    Tolerating Diet: Yes      DRUG ALLERGIES:  No Known Allergies  VITALS:  Blood pressure 103/67, pulse 69, temperature 99 F (37.2 C), temperature source Oral, resp. rate 16, height 6\' 3"  (1.905 m), weight 83 kg (182 lb 15.7 oz), SpO2 97 %.  PHYSICAL EXAMINATION:   Physical Exam    LABORATORY PANEL:   CBC  Recent Labs Lab 03/24/15 0508  WBC 6.8  HGB 7.9*  HCT 25.8*  PLT 227   ------------------------------------------------------------------------------------------------------------------  Chemistries   Recent Labs Lab 03/24/15 0043  NA 136  K 5.7*  CL 97*  CO2 32  GLUCOSE 138*  BUN 40*  CREATININE 6.68*  CALCIUM 9.9  AST 28  ALT 23  ALKPHOS 151*  BILITOT 1.0   ------------------------------------------------------------------------------------------------------------------  Cardiac Enzymes  Recent Labs Lab 03/23/15 1136 03/23/15 1700 03/24/15 0043  TROPONINI 0.07* 0.07* 0.07*    ------------------------------------------------------------------------------------------------------------------  RADIOLOGY:  Dg Chest 2 View  03/23/2015  CLINICAL DATA:  55 year old male with cirrhosis. Scheduled for upcoming paracentesis. Increased abdominal distention and shortness of breath today. Initial encounter. EXAM: CHEST  2 VIEW COMPARISON:  03/28/2014 and earlier FINDINGS: Seated AP and lateral views of the chest. Sequelae of CABG. Lower lung volumes. Stable cardiomegaly and mediastinal contours. No pleural effusion or pneumothorax. No consolidation. Chronic but increased interstitial markings in both lungs. Visualized tracheal air column is within normal limits. No acute osseous abnormality identified. No pneumoperitoneum. IMPRESSION: Lower lung volumes with chronic but increased interstitial markings favor due to pulmonary vascular congestion. Stable cardiomegaly, no pleural effusion. Electronically Signed   By: Genevie Ann M.D.   On: 03/23/2015 07:20   US Paracentesis  03/24/2015  CLINICAL DATA:  Recurrent symptomatic ascites, abdominal distention EXAM: ULTRASOUND GUIDED PARACENTESIS COMPARISON:  01/28/2015 PROCEDURE: An ultrasound guided paracentesis was thoroughly discussed with the patient and questions answered. The benefits, risks, alternatives and complications were also discussed. The patient understands and wishes to proceed with the procedure. Written consent was obtained. Ultrasound was performed to localize and mark an adequate pocket of fluid in the right lower quadrant of the abdomen. The area was then prepped and draped in the normal sterile fashion. 1% Lidocaine was used for local anesthesia. Under ultrasound guidance a safety centesis needle catheter was introduced. Paracentesis was performed. The catheter was removed and a dressing applied. COMPLICATIONS: None immediate FINDINGS: A total of approximately 5.6 L of clear peritoneal fluid was removed. A fluid sample was sent for  laboratory analysis. IMPRESSION: Successful ultrasound guided paracentesis yielding 5.6 L of ascites. Electronically Signed   By: Jerilynn Mages.  Shick M.D.   On: 03/24/2015 09:57  ASSESSMENT AND PLAN:    55 year old male with end-stage renal disease on hemodialysis, CAD and liver cirrhosis from EtOH abuse who presents with shortness of breath and found to have anemia with guaiac positive stools.  1. GI bleed: Patient has been seen and evaluated by GI. Plan is for EGD likely tomorrow. Patient is on octreotide and proton excisional continue. Patient has acute on chronic iron deficiency anemia. He is status post 1 unit of PRBCs. Continue to monitor hemoglobin.   2. [name]: Patient underwent hemodialysis yesterday. His shortness of breath has improved. He will undergo his normal schedule Tuesday, Thursday and Saturday.  3. Liver cirrhosis with ascites: He is scheduled for ultrasound-guided paracentesis today. He is dependent on paracentesis. According to him the patient he is also being recommended for TIPS procedure at [facility]. This appointment was scheduled for tomorrow which she will cancel and reschedule..  4. CAD: Continue Norvasc, Benzapril, labetalol. Hold aspirin for now.  5. EtOH abuse: Patient is now in remission     Management plans discussed with the patient and he is in agreement.  CODE STATUS: Full  TOTAL TIME TAKING CARE OF THIS PATIENT: 30 minutes.     POSSIBLE D/C 2-3 days, DEPENDING ON CLINICAL CONDITION.   Dymond Spreen M.D on 03/24/2015 at 10:46 AM  Between 7am to 6pm - Pager - [phone] After 6pm go to [email or web address] - password [email or web address]  Office  [phone]  CC: Primary care physician; [place]  Note: This dictation was prepared with Dragon dictation along with smaller phrase technology. Any transcriptional errors that result from this process are unintentional.

## 2015-03-24 NOTE — Progress Notes (Signed)
Per Nurse Avon Gully blood sugar of 78 at 1145. Barcode did not scan so FS did not flow across

## 2015-03-24 NOTE — Care Management Note (Signed)
Patient is active at [facility] on [facility].  I have sent admission records and will follow up with remaining records at discharge.  Iran Sizer  Dialysis Liaison  [phone]

## 2015-03-24 NOTE — Progress Notes (Signed)
Called Dr.Willis regarding medication for sore throat per patient request.  Doctor put in order.  Stanley [name]  03/24/2015  11:50 PM

## 2015-03-24 NOTE — Progress Notes (Signed)
Subjective:  Patient known to our practice from outpatient hemodialysis Currently he is followed by Dr. Holley Raring at [facility] Potassium level was still high this morning despite dialysis yesterday 1500 cc was removed with dialysis yesterday He underwent paracentesis today. 5.6 L half fluid was removed He states he feels better after paracentesis   Objective:  Vital signs in last 24 hours:  Temp:  [97.7 F (36.5 C)-99.1 F (37.3 C)] 97.8 F (36.6 C) (01/10 1445) Pulse Rate:  [67-81] 79 (01/10 1630) Resp:  [16-20] 18 (01/10 1700) BP: (98-129)/(59-79) 118/72 mmHg (01/10 1700) SpO2:  [96 %-100 %] 100 % (01/10 1600) Weight:  [83 kg (182 lb 15.7 oz)] 83 kg (182 lb 15.7 oz) (01/10 1445)  Weight change: 0.484 kg (1 lb 1.1 oz) Filed Weights   03/23/15 1630 03/23/15 1925 03/24/15 1445  Weight: 83 kg (182 lb 15.7 oz) 83 kg (182 lb 15.7 oz) 83 kg (182 lb 15.7 oz)    Intake/Output:    Intake/Output Summary (Last 24 hours) at 03/24/15 1705 Last data filed at 03/24/15 1630  Gross per 24 hour  Intake    356 ml  Output    125 ml  Net    231 ml     Physical Exam: General:  alert, chronically ill-appearing   HEENT  moist oral mucous membranes   Neck  supple   Pulm/lungs  normal respiratory effort, scattered rhonchi   CVS/Heart  regular rhythm, no rub or gallop   Abdomen:   distended from ascites   Extremities: + peripheral edema   Neurologic:  alert, nonfocal   Skin:  no acute rashes   Access:  AV fistula        Basic Metabolic Panel:   Recent Labs Lab 03/23/15 0528 03/24/15 0043  NA 139 136  K 5.8* 5.7*  CL 94* 97*  CO2 35* 32  GLUCOSE 98 138*  BUN 72* 40*  CREATININE 10.17* 6.68*  CALCIUM 12.8* 9.9     CBC:  Recent Labs Lab 03/23/15 0528 03/23/15 1136 03/24/15 0043 03/24/15 0508  WBC 8.7 7.1  --  6.8  NEUTROABS  --   --   --  5.3  HGB 7.7* 6.7*  6.7* 8.0* 7.9*  HCT 24.4* 21.9*  21.9*  --  25.8*  MCV 94.8 93.9  --  94.3  PLT 289  237  --  227      Microbiology:  No results found for this or any previous visit (from the past 720 hour(s)).  Coagulation Studies:  Recent Labs  03/23/15 1136 03/24/15 0508  LABPROT 15.7* 15.6*  INR 1.23 1.22    Urinalysis: No results for input(s): COLORURINE, LABSPEC, PHURINE, GLUCOSEU, HGBUR, BILIRUBINUR, KETONESUR, PROTEINUR, UROBILINOGEN, NITRITE, LEUKOCYTESUR in the last 72 hours.  Invalid input(s): APPERANCEUR    Imaging: Dg Chest 2 View  03/23/2015  CLINICAL DATA:  55 year old male with cirrhosis. Scheduled for upcoming paracentesis. Increased abdominal distention and shortness of breath today. Initial encounter. EXAM: CHEST  2 VIEW COMPARISON:  03/28/2014 and earlier FINDINGS: Seated AP and lateral views of the chest. Sequelae of CABG. Lower lung volumes. Stable cardiomegaly and mediastinal contours. No pleural effusion or pneumothorax. No consolidation. Chronic but increased interstitial markings in both lungs. Visualized tracheal air column is within normal limits. No acute osseous abnormality identified. No pneumoperitoneum. IMPRESSION: Lower lung volumes with chronic but increased interstitial markings favor due to pulmonary vascular congestion. Stable cardiomegaly, no pleural effusion. Electronically Signed   By: Genevie Ann  M.D.   On: 03/23/2015 07:20   US Paracentesis  03/24/2015  CLINICAL DATA:  Recurrent symptomatic ascites, abdominal distention EXAM: ULTRASOUND GUIDED PARACENTESIS COMPARISON:  01/28/2015 PROCEDURE: An ultrasound guided paracentesis was thoroughly discussed with the patient and questions answered. The benefits, risks, alternatives and complications were also discussed. The patient understands and wishes to proceed with the procedure. Written consent was obtained. Ultrasound was performed to localize and mark an adequate pocket of fluid in the right lower quadrant of the abdomen. The area was then prepped and draped in the normal sterile fashion. 1%  Lidocaine was used for local anesthesia. Under ultrasound guidance a safety centesis needle catheter was introduced. Paracentesis was performed. The catheter was removed and a dressing applied. COMPLICATIONS: None immediate FINDINGS: A total of approximately 5.6 L of clear peritoneal fluid was removed. A fluid sample was sent for laboratory analysis. IMPRESSION: Successful ultrasound guided paracentesis yielding 5.6 L of ascites. Electronically Signed   By: Jerilynn Mages.  Shick M.D.   On: 03/24/2015 09:57     Medications:   . octreotide  (SANDOSTATIN)    IV infusion 50 mcg/hr (03/24/15 0756)   . sodium chloride   Intravenous Once  . amLODipine  5 mg Oral Daily  . atorvastatin  40 mg Oral QPC supper  . benazepril  10 mg Oral Daily  . calcium acetate  2,001 mg Oral TID WC  . cinacalcet  30 mg Oral Q breakfast  . gabapentin  300 mg Oral Daily  . insulin aspart  0-5 Units Subcutaneous QHS  . insulin aspart  0-9 Units Subcutaneous TID WC  . labetalol  200 mg Oral Daily  . multivitamin with minerals  1 tablet Oral Daily  . pantoprazole (PROTONIX) IV  40 mg Intravenous Q12H  . sodium chloride  3 mL Intravenous Q12H   acetaminophen **OR** acetaminophen, alum & mag hydroxide-simeth, hydrALAZINE, HYDROcodone-acetaminophen, morphine injection, ondansetron **OR** ondansetron (ZOFRAN) IV, senna-docusate  Assessment/ Plan:  55 y.o. male with end-stage renal disease, dialysis at [facility], coronary disease, hypertension, history of alcohol abuse, history of drug abuse,   1. End-stage renal disease - Urgent dialysis done today again for hyperkalemia 2. Ascites from liver cirrhosis - ultrasound-guided paracentesis  - 5.5 L of fluid was removed 3. Secondary hyperparathyroidism Continue PhosLo and Sensipar once he resumes normal by mouth intake 4. Severe anemia of chronic kidney disease and blood loss Continue Procrit Blood transfusion given during dialysis GI evaluation ongoing 5.   Hyperkalemia See above   LOS: 1 Stanley [name] 1/10/20175:05 PM

## 2015-03-25 ENCOUNTER — Inpatient Hospital Stay: Payer: Medicare Other | Admitting: Anesthesiology

## 2015-03-25 ENCOUNTER — Encounter: Payer: Self-pay | Admitting: Anesthesiology

## 2015-03-25 ENCOUNTER — Inpatient Hospital Stay: Payer: Medicare Other

## 2015-03-25 ENCOUNTER — Encounter
Admission: EM | Disposition: A | Discharge status: Home / Self Care | Payer: Self-pay | Source: Home / Self Care | Attending: Internal Medicine

## 2015-03-25 LAB — BASIC METABOLIC PANEL WITH GFR
Anion gap: 7 (ref 5–15)
BUN: 30 mg/dL — ABNORMAL HIGH (ref 6–20)
CO2: 32 mmol/L (ref 22–32)
Calcium: 8.5 mg/dL — ABNORMAL LOW (ref 8.9–10.3)
Chloride: 97 mmol/L — ABNORMAL LOW (ref 101–111)
Creatinine, Ser: 5.34 mg/dL — ABNORMAL HIGH (ref 0.61–1.24)
GFR calc Af Amer: 13 mL/min — ABNORMAL LOW (ref >60)
GFR calc non Af Amer: 11 mL/min — ABNORMAL LOW (ref >60)
Glucose, Bld: 92 mg/dL (ref 65–99)
Potassium: 4.6 mmol/L (ref 3.5–5.1)
Sodium: 136 mmol/L (ref 135–145)

## 2015-03-25 LAB — GLUCOSE, CAPILLARY
Glucose-Capillary: 81 mg/dL (ref 65–99)
Glucose-Capillary: 93 mg/dL (ref 65–99)
Glucose-Capillary: 134 mg/dL — ABNORMAL HIGH (ref 65–99)
Glucose-Capillary: 146 mg/dL — ABNORMAL HIGH (ref 65–99)

## 2015-03-25 LAB — CYTOLOGY - NON PAP

## 2015-03-25 LAB — CBC
HEMATOCRIT: 25.7 % — AB (ref 40.0–52.0)
Hemoglobin: 7.8 g/dL — ABNORMAL LOW (ref 13.0–18.0)
MCH: 28.6 pg (ref 26.0–34.0)
MCHC: 30.5 g/dL — ABNORMAL LOW (ref 32.0–36.0)
MCV: 93.7 fL (ref 80.0–100.0)
PLATELETS: 211 10*3/uL (ref 150–440)
RBC: 2.74 MIL/uL — ABNORMAL LOW (ref 4.40–5.90)
RDW: 18.4 % — AB (ref 11.5–14.5)
WBC: 5.7 10*3/uL (ref 3.8–10.6)

## 2015-03-25 SURGERY — ESOPHAGOGASTRODUODENOSCOPY (EGD) WITH PROPOFOL
Anesthesia: General

## 2015-03-25 MED ORDER — EPOETIN ALFA 10000 UNIT/ML IJ SOLN
4000.0000 [IU] | INTRAMUSCULAR | Status: DC
  Administered 2015-03-26: 4000 [IU] via INTRAVENOUS

## 2015-03-25 MED ORDER — IPRATROPIUM-ALBUTEROL 0.5-2.5 (3) MG/3ML IN SOLN
3.0000 mL | Freq: Four times a day (QID) | RESPIRATORY_TRACT | Status: DC
  Administered 2015-03-25: 3 mL via RESPIRATORY_TRACT

## 2015-03-25 MED ORDER — MENTHOL 3 MG MT LOZG
1.0000 | LOZENGE | OROMUCOSAL | Status: DC | PRN
  Administered 2015-03-25 (×6): 3 mg via ORAL
  Filled 2015-03-25: qty 9

## 2015-03-25 MED ORDER — ONDANSETRON HCL 4 MG/2ML IJ SOLN: 4.0000 mg | Freq: Once | INTRAMUSCULAR | Status: DC | PRN

## 2015-03-25 MED ORDER — SODIUM CHLORIDE 0.9 % IV SOLN: INTRAVENOUS | Status: DC

## 2015-03-25 MED ORDER — FENTANYL CITRATE (PF) 100 MCG/2ML IJ SOLN: 25.0000 ug | INTRAMUSCULAR | Status: DC | PRN

## 2015-03-25 MED ORDER — PANTOPRAZOLE SODIUM 40 MG PO TBEC: 40.0000 mg | DELAYED_RELEASE_TABLET | Freq: Every day | ORAL | Status: DC

## 2015-03-25 NOTE — Care Management Important Message (Signed)
Important Message  Patient Details  Name: Stanley [name] MRN: [id number] Date of Birth: 10-04-1960   Medicare Important Message Given:  Yes    Juliann Pulse A Blythe Veach 03/25/2015, 9:50 AM

## 2015-03-25 NOTE — Progress Notes (Signed)
Subjective:  Patient known to our practice from outpatient hemodialysis Currently he is followed by Dr. Holley Raring at [facility] dialysis center Extra dialysis done yesterday January 10 He underwent paracentesis this admission. 5.6 L  fluid was removed EGD awaited today    Objective:  Vital signs in last 24 hours:  Temp:  [97.9 F (36.6 C)-101 F (38.3 C)] 99.5 F (37.5 C) (01/11 1353) Pulse Rate:  [63-82] 80 (01/11 1353) Resp:  [17-26] 19 (01/11 1353) BP: (94-124)/(56-79) 121/75 mmHg (01/11 1353) SpO2:  [84 %-100 %] 100 % (01/11 1353) Weight:  [82.5 kg (181 lb 14.1 oz)] 82.5 kg (181 lb 14.1 oz) (01/10 1809)  Weight change: -1.4 kg (-3 lb 1.4 oz) Filed Weights   03/23/15 1925 03/24/15 1445 03/24/15 1809  Weight: 83 kg (182 lb 15.7 oz) 83 kg (182 lb 15.7 oz) 82.5 kg (181 lb 14.1 oz)    Intake/Output:    Intake/Output Summary (Last 24 hours) at 03/25/15 1531 Last data filed at 03/25/15 1217  Gross per 24 hour  Intake    580 ml  Output   1300 ml  Net   -720 ml     Physical Exam: General:  alert, chronically ill-appearing   HEENT  moist oral mucous membranes   Neck  supple   Pulm/lungs  normal respiratory effort, scattered rhonchi   CVS/Heart  regular rhythm, no rub or gallop   Abdomen:   distended from ascites   Extremities: + peripheral edema   Neurologic:  alert, nonfocal   Skin:  no acute rashes   Access:  AV fistula        Basic Metabolic Panel:   Recent Labs Lab 03/23/15 0528 03/24/15 0043 03/25/15 0804  NA 139 136 136  K 5.8* 5.7* 4.6  CL 94* 97* 97*  CO2 35* 32 32  GLUCOSE 98 138* 92  BUN 72* 40* 30*  CREATININE 10.17* 6.68* 5.34*  CALCIUM 12.8* 9.9 8.5*     CBC:  Recent Labs Lab 03/23/15 0528 03/23/15 1136 03/24/15 0043 03/24/15 0508 03/25/15 0804  WBC 8.7 7.1  --  6.8 5.7  NEUTROABS  --   --   --  5.3  --   HGB 7.7* 6.7*  6.7* 8.0* 7.9* 7.8*  HCT 24.4* 21.9*  21.9*  --  25.8* 25.7*  MCV 94.8 93.9  --  94.3 93.7  PLT 289 237   --  227 211      Microbiology:  No results found for this or any previous visit (from the past 720 hour(s)).  Coagulation Studies:  Recent Labs  03/23/15 1136 03/24/15 0508  LABPROT 15.7* 15.6*  INR 1.23 1.22    Urinalysis: No results for input(s): COLORURINE, LABSPEC, PHURINE, GLUCOSEU, HGBUR, BILIRUBINUR, KETONESUR, PROTEINUR, UROBILINOGEN, NITRITE, LEUKOCYTESUR in the last 72 hours.  Invalid input(s): APPERANCEUR    Imaging: US Paracentesis  03/24/2015  CLINICAL DATA:  Recurrent symptomatic ascites, abdominal distention EXAM: ULTRASOUND GUIDED PARACENTESIS COMPARISON:  01/28/2015 PROCEDURE: An ultrasound guided paracentesis was thoroughly discussed with the patient and questions answered. The benefits, risks, alternatives and complications were also discussed. The patient understands and wishes to proceed with the procedure. Written consent was obtained. Ultrasound was performed to localize and mark an adequate pocket of fluid in the right lower quadrant of the abdomen. The area was then prepped and draped in the normal sterile fashion. 1% Lidocaine was used for local anesthesia. Under ultrasound guidance a safety centesis needle catheter was introduced. Paracentesis was performed. The catheter was  removed and a dressing applied. COMPLICATIONS: None immediate FINDINGS: A total of approximately 5.6 L of clear peritoneal fluid was removed. A fluid sample was sent for laboratory analysis. IMPRESSION: Successful ultrasound guided paracentesis yielding 5.6 L of ascites. Electronically Signed   By: Jerilynn Mages.  Shick M.D.   On: 03/24/2015 09:57     Medications:   . octreotide  (SANDOSTATIN)    IV infusion 50 mcg/hr (03/25/15 0930)   . amLODipine  5 mg Oral Daily  . atorvastatin  40 mg Oral QPC supper  . benazepril  10 mg Oral Daily  . calcium acetate  2,001 mg Oral TID WC  . cinacalcet  30 mg Oral Q breakfast  . gabapentin  300 mg Oral Daily  . insulin aspart  0-5 Units Subcutaneous QHS   . insulin aspart  0-9 Units Subcutaneous TID WC  . labetalol  200 mg Oral Daily  . multivitamin with minerals  1 tablet Oral Daily  . pantoprazole (PROTONIX) IV  40 mg Intravenous Q12H  . sodium chloride  3 mL Intravenous Q12H   acetaminophen **OR** acetaminophen, alum & mag hydroxide-simeth, hydrALAZINE, HYDROcodone-acetaminophen, menthol-cetylpyridinium, morphine injection, ondansetron **OR** ondansetron (ZOFRAN) IV, senna-docusate  Assessment/ Plan:  55 y.o. male with end-stage renal disease, dialysis at [facility], coronary disease, hypertension, history of alcohol abuse, history of drug abuse,   1. End-stage renal disease - will follow Tuesday/Thursday/Saturday schedule  2 Ascites from liver cirrhosis - ultrasound-guided paracentesis  - 5.5 L of fluid was removed  3. Secondary hyperparathyroidism Continue PhosLo and Sensipar once he resumes normal by mouth intake  4. Severe anemia of chronic kidney disease and blood loss Continue Procrit Blood transfusion given during dialysis GI evaluation ongoing  5.  Hyperkalemia  improved with dialysis    LOS: 2 Tamel Abel 1/11/20173:31 PM

## 2015-03-25 NOTE — Anesthesia Preprocedure Evaluation (Addendum)
Anesthesia Evaluation  Patient identified by MRN, date of birth, ID band Patient awake    Reviewed: Allergy & Precautions, H&P , NPO status , Patient's Chart, lab work & pertinent test results, reviewed documented beta blocker date and time   Airway Mallampati: II  TM Distance: >3 FB Neck ROM: Full  Mouth opening: Limited Mouth Opening  Dental  (+) Poor Dentition, Dental Advisory Given   Pulmonary neg pulmonary ROS, Current Smoker,    Pulmonary exam normal        Cardiovascular hypertension, Pt. on medications and Pt. on home beta blockers + CAD and + CABG   Rhythm:Regular Rate:Normal     Neuro/Psych PSYCHIATRIC DISORDERS Hx of suicidal ideationPeripheral neuropathy  Neuromuscular disease negative neurological ROS  negative psych ROS   GI/Hepatic Neg liver ROS, GERD  Medicated and Controlled,(+) Cirrhosis   Esophageal Varices and ascites  substance abuse  alcohol use,   Endo/Other  diabetes, Well Controlled, Type 2, Oral Hypoglycemic Agents  Renal/GU ESRF and DialysisRenal diseasenegative Renal ROSTues/Th/Sat dialysis  negative genitourinary   Musculoskeletal negative musculoskeletal ROS (+)   Abdominal   Peds negative pediatric ROS (+)  Hematology negative hematology ROS (+) anemia ,   Anesthesia Other Findings Hx of mandible fracture  Reproductive/Obstetrics                            Anesthesia Physical  Anesthesia Plan  ASA: IV  Anesthesia Plan: General   Post-op Pain Management:    Induction: Intravenous  Airway Management Planned: Nasal Cannula  Additional Equipment:   Intra-op Plan:   Post-operative Plan: Extubation in OR  Informed Consent: I have reviewed the patients History and Physical, chart, labs and discussed the procedure including the risks, benefits and alternatives for the proposed anesthesia with the patient or authorized representative who has indicated  his/her understanding and acceptance.   Dental advisory given  Plan Discussed with: CRNA and Surgeon  Anesthesia Plan Comments: (Patient denies cocaine use in past 24 hours. Risk and complications of anesthesia, including cardiac arrhythmia and life threatening events, explained to patient and all questions answered.)        Anesthesia Quick Evaluation

## 2015-03-25 NOTE — Progress Notes (Signed)
[facility] [place] NAME: Stanley [name]    MR#:  [id number]  DATE OF BIRTH:  1960-10-16  SUBJECTIVE:  Patient feels better after paracentesis morning. He denies melena or hematochezia. Plan for EGD this afternoon.  REVIEW OF SYSTEMS:    Review of Systems  Constitutional: Negative for fever, chills and malaise/fatigue.  HENT: Negative for sore throat.   Eyes: Negative for blurred vision.  Respiratory: Negative for cough, hemoptysis, shortness of breath and wheezing.   Cardiovascular: Negative for chest pain, palpitations and leg swelling.  Gastrointestinal: Negative for nausea, vomiting, abdominal pain, diarrhea and blood in stool.  Genitourinary: Negative for dysuria.  Musculoskeletal: Negative for back pain.  Neurological: Negative for dizziness, tremors and headaches.  Endo/Heme/Allergies: Does not bruise/bleed easily.    Tolerating Diet: Nothing by mouth      DRUG ALLERGIES:  No Known Allergies  VITALS:  Blood pressure 115/63, pulse 80, temperature 99.9 F (37.7 C), temperature source Oral, resp. rate 21, height 6\' 3"  (1.905 m), weight 82.5 kg (181 lb 14.1 oz), SpO2 93 %.  PHYSICAL EXAMINATION:   Physical Exam    LABORATORY PANEL:   CBC  Recent Labs Lab 03/25/15 0804  WBC 5.7  HGB 7.8*  HCT 25.7*  PLT 211   ------------------------------------------------------------------------------------------------------------------  Chemistries   Recent Labs Lab 03/24/15 0043 03/25/15 0804  NA 136 136  K 5.7* 4.6  CL 97* 97*  CO2 32 32  GLUCOSE 138* 92  BUN 40* 30*  CREATININE 6.68* 5.34*  CALCIUM 9.9 8.5*  AST 28  --   ALT 23  --   ALKPHOS 151*  --   BILITOT 1.0  --    ------------------------------------------------------------------------------------------------------------------  Cardiac Enzymes  Recent Labs Lab 03/23/15 1136 03/23/15 1700 03/24/15 0043  TROPONINI 0.07* 0.07* 0.07*    ------------------------------------------------------------------------------------------------------------------  RADIOLOGY:  US Paracentesis  03/24/2015  CLINICAL DATA:  Recurrent symptomatic ascites, abdominal distention EXAM: ULTRASOUND GUIDED PARACENTESIS COMPARISON:  01/28/2015 PROCEDURE: An ultrasound guided paracentesis was thoroughly discussed with the patient and questions answered. The benefits, risks, alternatives and complications were also discussed. The patient understands and wishes to proceed with the procedure. Written consent was obtained. Ultrasound was performed to localize and mark an adequate pocket of fluid in the right lower quadrant of the abdomen. The area was then prepped and draped in the normal sterile fashion. 1% Lidocaine was used for local anesthesia. Under ultrasound guidance a safety centesis needle catheter was introduced. Paracentesis was performed. The catheter was removed and a dressing applied. COMPLICATIONS: None immediate FINDINGS: A total of approximately 5.6 L of clear peritoneal fluid was removed. A fluid sample was sent for laboratory analysis. IMPRESSION: Successful ultrasound guided paracentesis yielding 5.6 L of ascites. Electronically Signed   By: Jerilynn Mages.  Shick M.D.   On: 03/24/2015 09:57     ASSESSMENT AND PLAN:    55 year old male with end-stage renal disease on hemodialysis, CAD and liver cirrhosis from EtOH abuse who presents with shortness of breath and found to have anemia with guaiac positive stools.  1. GI bleed: Patient has been seen and evaluated by GI. Plan is for EGD this afternoon. Patient is on octreotide and protonix. Patient has acute on chronic iron deficiency anemia. He is status post 1 unit of PRBCs. Continue to monitor hemoglobin. Hemoglobin has been stable.  2. [name]: Patient underwent hemodialysis and paracentesis. His shortness of breath has improved. He will undergo his normal schedule  Tuesday,  Thursday and Saturday.  3. Liver cirrhosis with ascites: He is status post ultrasound-guided paracentesis with 5-1/2 L drained. Marland Kitchen He is dependent on paracentesis. According to him the patient he is also being recommended for TIPS procedure at [facility]. This appointment was scheduled for tomorrow which she will cancel and reschedule..  4. CAD: Continue Norvasc, Benzapril, labetalol. Hold aspirin for now.  5. EtOH abuse: Patient is now in remission     Management plans discussed with the patient and he is in agreement.  CODE STATUS: Full  TOTAL TIME TAKING CARE OF THIS PATIENT: 25 minutes.     POSSIBLE D/C today or tomorrow, DEPENDING ON EGD  Penni Penado M.D on 03/25/2015 at 10:08 AM  Between 7am to 6pm - Pager - [phone] After 6pm go to [email or web address] - password [email or web address] [facility]  Office  [phone]  CC: Primary care physician; [place]  Note: This dictation was prepared with Dragon dictation along with smaller phrase technology. Any transcriptional errors that result from this process are unintentional.

## 2015-03-25 NOTE — Progress Notes (Signed)
MEDICATION RELATED CONSULT NOTE - INITIAL   Pharmacy Consult for Octreotide    No Known Allergies  Patient Measurements: Height: 6\' 3"  (190.5 cm) Weight: 181 lb 14.1 oz (82.5 kg) IBW/kg (Calculated) : 84.5   Vital Signs: Temp: 98.3 F (36.8 C) (01/11 0559) Temp Source: Oral (01/11 0559) BP: 94/56 mmHg (01/11 0559) Pulse Rate: 63 (01/11 0559) Intake/Output from previous day: 01/10 0701 - 01/11 0700 In: [id number] [P.O.:240; I.V.:497] Out: 1175 [Urine:175] Intake/Output from this shift:    Labs:  Recent Labs  03/23/15 0528 03/23/15 1136 03/24/15 0043 03/24/15 0508 03/25/15 0804  WBC 8.7 7.1  --  6.8 5.7  HGB 7.7* 6.7*  6.7* 8.0* 7.9* 7.8*  HCT 24.4* 21.9*  21.9*  --  25.8* 25.7*  PLT 289 237  --  227 211  CREATININE 10.17*  --  6.68*  --  5.34*  ALBUMIN 3.7  --  3.3*  --   --   PROT 7.7  --  7.2  --   --   AST 37  --  28  --   --   ALT 28  --  23  --   --   ALKPHOS 181*  --  151*  --   --   BILITOT 0.9  --  1.0  --   --    Estimated Creatinine Clearance: 18.2 mL/min (by C-G formula based on Cr of 5.34).   Microbiology: No results found for this or any previous visit (from the past 720 hour(s)).  Medical History: Past Medical History  Diagnosis Date  . Coronary artery disease   . Hypertension   . GERD (gastroesophageal reflux disease)   . Suicidal ideation     & HOMICIDAL IDEATION --  06-16-2013   ADMITTED TO BEHAVIOR HEALTH  . Alcohol abuse   . Drug abuse   . Diabetic peripheral neuropathy ([place])   . End stage renal disease on dialysis [facility]) NEPHROLOGIST-   DR [facility]  IN [place]    HEMODIALYSIS --   TUES/  THURS/  SAT    Medications:  Scheduled:  . sodium chloride   Intravenous Once  . amLODipine  5 mg Oral Daily  . atorvastatin  40 mg Oral QPC supper  . benazepril  10 mg Oral Daily  . calcium acetate  2,001 mg Oral TID WC  . cinacalcet  30 mg Oral Q breakfast  . gabapentin  300 mg Oral Daily  . insulin aspart  0-5 Units Subcutaneous QHS  .  insulin aspart  0-9 Units Subcutaneous TID WC  . labetalol  200 mg Oral Daily  . multivitamin with minerals  1 tablet Oral Daily  . pantoprazole (PROTONIX) IV  40 mg Intravenous Q12H  . sodium chloride  3 mL Intravenous Q12H   Infusions:  . octreotide  (SANDOSTATIN)    IV infusion 25 mcg/hr (03/25/15 [id number])    Assessment: 55 y/o M with heme positive stool and anemia in the setting of cirrhosis.   Plan:  Patient on octreotide drip day 3. Nursing called with question regarding what rate to run octreotide drip with order stating 25-50 mcg/hr. GI MD instructed to run drip at 50 mcg/hr and then drop to 25 mcg/hr day 5 to titrate off. Will continue to follow.   Ulice Dash D 03/25/2015,9:31 AM

## 2015-03-25 NOTE — Consult Note (Signed)
Temperature 101 in endo unit.  Wheezing and bibasilar rhonchi left>right.  Discussed with Dr Benjie Karvonen.  Will hold sedated procedure today.  Would consider repear [name] tomorrow for ascites with re-tap if needed.

## 2015-03-25 NOTE — Consult Note (Addendum)
Subjective: Patient seen for anemia and black stools. Patient has been hemodynamically stable. He underwent hemodialysis 2 days in a row the last time yesterday due to hyperkalemia. He has some nausea but no abdominal pain. Been no emesis. No bowel movement recorded since admission.  Objective: Vital signs in last 24 hours: Temp:  [97.8 F (36.6 C)-99.9 F (37.7 C)] 99.9 F (37.7 C) (01/11 0956) Pulse Rate:  [63-82] 80 (01/11 1001) Resp:  [17-26] 21 (01/11 0956) BP: (94-124)/(56-79) 115/63 mmHg (01/11 0956) SpO2:  [84 %-100 %] 93 % (01/11 1001) Weight:  [82.5 kg (181 lb 14.1 oz)-83 kg (182 lb 15.7 oz)] 82.5 kg (181 lb 14.1 oz) (01/10 1809) Blood pressure 115/63, pulse 80, temperature 99.9 F (37.7 C), temperature source Oral, resp. rate 21, height 6\' 3"  (1.905 m), weight 82.5 kg (181 lb 14.1 oz), SpO2 93 %.   Intake/Output from previous day: 01/10 0701 - 01/11 0700 In: 737 [P.O.:240; I.V.:497] Out: 1175 [Urine:175]  Intake/Output this shift: Total I/O In: -  Out: 250 [Urine:250]   General appearance:  A 55 year old male no distress Resp:  Coarse rhonchi bilaterally otherwise Cardio:  Regular rate and rhythm without rub or gallop GI:  Soft nontender bowel sounds positive normoactive. There is some small to moderate amount of ascites. Extremities:  Clubbing cyanosis or edema   Lab Results: Results for orders placed or performed during the hospital encounter of 03/23/15 (from the past 24 hour(s))  Glucose, capillary     Status: None   Collection Time: 03/24/15  7:06 PM  Result Value Ref Range   Glucose-Capillary 91 65 - 99 mg/dL  Glucose, capillary     Status: Abnormal   Collection Time: 03/24/15 10:02 PM  Result Value Ref Range   Glucose-Capillary 143 (H) 65 - 99 mg/dL   Comment 1 Notify RN   Glucose, capillary     Status: None   Collection Time: 03/25/15  7:33 AM  Result Value Ref Range   Glucose-Capillary 81 65 - 99 mg/dL   Comment 1 Notify RN   CBC     Status:  Abnormal   Collection Time: 03/25/15  8:04 AM  Result Value Ref Range   WBC 5.7 3.8 - 10.6 K/uL   RBC 2.74 (L) 4.40 - 5.90 MIL/uL   Hemoglobin 7.8 (L) 13.0 - 18.0 g/dL   HCT 25.7 (L) 40.0 - 52.0 %   MCV 93.7 80.0 - 100.0 fL   MCH 28.6 26.0 - 34.0 pg   MCHC 30.5 (L) 32.0 - 36.0 g/dL   RDW 18.4 (H) 11.5 - 14.5 %   Platelets 211 150 - 440 K/uL  Basic metabolic panel     Status: Abnormal   Collection Time: 03/25/15  8:04 AM  Result Value Ref Range   Sodium 136 135 - 145 mmol/L   Potassium 4.6 3.5 - 5.1 mmol/L   Chloride 97 (L) 101 - 111 mmol/L   CO2 32 22 - 32 mmol/L   Glucose, Bld 92 65 - 99 mg/dL   BUN 30 (H) 6 - 20 mg/dL   Creatinine, Ser 5.34 (H) 0.61 - 1.24 mg/dL   Calcium 8.5 (L) 8.9 - 10.3 mg/dL   GFR calc non Af Amer 11 (L) >60 mL/min   GFR calc Af Amer 13 (L) >60 mL/min   Anion gap 7 5 - 15  Glucose, capillary     Status: None   Collection Time: 03/25/15 11:23 AM  Result Value Ref Range   Glucose-Capillary 93 65 -  99 mg/dL   Comment 1 Notify RN       Recent Labs  03/23/15 1136 03/24/15 0043 03/24/15 0508 03/25/15 0804  WBC 7.1  --  6.8 5.7  HGB 6.7*  6.7* 8.0* 7.9* 7.8*  HCT 21.9*  21.9*  --  25.8* 25.7*  PLT 237  --  227 211   BMET  Recent Labs  03/23/15 0528 03/24/15 0043 03/25/15 0804  NA 139 136 136  K 5.8* 5.7* 4.6  CL 94* 97* 97*  CO2 35* 32 32  GLUCOSE 98 138* 92  BUN 72* 40* 30*  CREATININE 10.17* 6.68* 5.34*  CALCIUM 12.8* 9.9 8.5*   LFT  Recent Labs  03/24/15 0043  PROT 7.2  ALBUMIN 3.3*  AST 28  ALT 23  ALKPHOS 151*  BILITOT 1.0   PT/INR  Recent Labs  03/23/15 1136 03/24/15 0508  LABPROT 15.7* 15.6*  INR 1.23 1.22   Hepatitis Panel  Recent Labs  03/23/15 1136  HEPBSAG Negative   C-Diff No results for input(s): CDIFFTOX in the last 72 hours. No results for input(s): CDIFFPCR in the last 72 hours.   Studies/Results: US Paracentesis  03/24/2015  CLINICAL DATA:  Recurrent symptomatic ascites, abdominal  distention EXAM: ULTRASOUND GUIDED PARACENTESIS COMPARISON:  01/28/2015 PROCEDURE: An ultrasound guided paracentesis was thoroughly discussed with the patient and questions answered. The benefits, risks, alternatives and complications were also discussed. The patient understands and wishes to proceed with the procedure. Written consent was obtained. Ultrasound was performed to localize and mark an adequate pocket of fluid in the right lower quadrant of the abdomen. The area was then prepped and draped in the normal sterile fashion. 1% Lidocaine was used for local anesthesia. Under ultrasound guidance a safety centesis needle catheter was introduced. Paracentesis was performed. The catheter was removed and a dressing applied. COMPLICATIONS: None immediate FINDINGS: A total of approximately 5.6 L of clear peritoneal fluid was removed. A fluid sample was sent for laboratory analysis. IMPRESSION: Successful ultrasound guided paracentesis yielding 5.6 L of ascites. Electronically Signed   By: Jerilynn Mages.  Shick M.D.   On: 03/24/2015 09:57    Scheduled Inpatient Medications:   . [MAR Hold] sodium chloride   Intravenous Once  . [MAR Hold] amLODipine  5 mg Oral Daily  . [MAR Hold] atorvastatin  40 mg Oral QPC supper  . [MAR Hold] benazepril  10 mg Oral Daily  . [MAR Hold] calcium acetate  2,001 mg Oral TID WC  . [MAR Hold] cinacalcet  30 mg Oral Q breakfast  . [MAR Hold] gabapentin  300 mg Oral Daily  . [MAR Hold] insulin aspart  0-5 Units Subcutaneous QHS  . [MAR Hold] insulin aspart  0-9 Units Subcutaneous TID WC  . [MAR Hold] labetalol  200 mg Oral Daily  . [MAR Hold] multivitamin with minerals  1 tablet Oral Daily  . [MAR Hold] pantoprazole (PROTONIX) IV  40 mg Intravenous Q12H  . [MAR Hold] sodium chloride  3 mL Intravenous Q12H    Continuous Inpatient Infusions:   . sodium chloride    . octreotide  (SANDOSTATIN)    IV infusion 50 mcg/hr (03/25/15 0930)    PRN Inpatient Medications:  [MAR Hold]  acetaminophen **OR** [MAR Hold] acetaminophen, [MAR Hold] alum & mag hydroxide-simeth, fentaNYL (SUBLIMAZE) injection, [MAR Hold] hydrALAZINE, [MAR Hold] HYDROcodone-acetaminophen, [MAR Hold] menthol-cetylpyridinium, [MAR Hold]  morphine injection, [MAR Hold] ondansetron **OR** [MAR Hold] ondansetron (ZOFRAN) IV, ondansetron (ZOFRAN) IV, [MAR Hold] senna-docusate  Miscellaneous:   Assessment:  1. Anemia,  reported hematemesis, black stools.  Plan:  1. EGD today. I have discussed the risks benefits and complications of procedures to include not limited to bleeding, infection, perforation and the risk of sedation and the patient wishes to proceed. Further recommendations follow  Lollie Sails MD 03/25/2015, 1:19 PM

## 2015-03-26 ENCOUNTER — Inpatient Hospital Stay: Admission: RE | Admit: 2015-03-26 | Payer: Medicare Other | Source: Ambulatory Visit

## 2015-03-26 ENCOUNTER — Inpatient Hospital Stay: Payer: Medicare Other

## 2015-03-26 LAB — RENAL FUNCTION PANEL
Albumin: 3 g/dL — ABNORMAL LOW (ref 3.5–5.0)
Anion gap: 9 (ref 5–15)
BUN: 43 mg/dL — ABNORMAL HIGH (ref 6–20)
CHLORIDE: 96 mmol/L — AB (ref 101–111)
CO2: 30 mmol/L (ref 22–32)
CREATININE: 7.31 mg/dL — AB (ref 0.61–1.24)
Calcium: 8.5 mg/dL — ABNORMAL LOW (ref 8.9–10.3)
GFR, EST AFRICAN AMERICAN: 9 mL/min — AB (ref >60)
GFR, EST NON AFRICAN AMERICAN: 7 mL/min — AB (ref >60)
Glucose, Bld: 97 mg/dL (ref 65–99)
POTASSIUM: 5.2 mmol/L — AB (ref 3.5–5.1)
Phosphorus: 4.9 mg/dL — ABNORMAL HIGH (ref 2.5–4.6)
Sodium: 135 mmol/L (ref 135–145)

## 2015-03-26 LAB — CBC
HEMATOCRIT: 26.6 % — AB (ref 40.0–52.0)
Hemoglobin: 8.3 g/dL — ABNORMAL LOW (ref 13.0–18.0)
MCH: 29.5 pg (ref 26.0–34.0)
MCHC: 31.4 g/dL — ABNORMAL LOW (ref 32.0–36.0)
MCV: 93.9 fL (ref 80.0–100.0)
PLATELETS: 232 10*3/uL (ref 150–440)
RBC: 2.83 MIL/uL — AB (ref 4.40–5.90)
RDW: 18.2 % — ABNORMAL HIGH (ref 11.5–14.5)
WBC: 8 10*3/uL (ref 3.8–10.6)

## 2015-03-26 LAB — PATHOLOGY

## 2015-03-26 LAB — GLUCOSE, CAPILLARY
GLUCOSE-CAPILLARY: 101 mg/dL — AB (ref 65–99)
Glucose-Capillary: 127 mg/dL — ABNORMAL HIGH (ref 65–99)
Glucose-Capillary: 129 mg/dL — ABNORMAL HIGH (ref 65–99)

## 2015-03-26 MED ORDER — LEVOFLOXACIN IN D5W 500 MG/100ML IV SOLN
500.0000 mg | Freq: Once | INTRAVENOUS | Status: AC
  Administered 2015-03-26: 500 mg via INTRAVENOUS
  Filled 2015-03-26: qty 100

## 2015-03-26 MED ORDER — METHYLPREDNISOLONE SODIUM SUCC 40 MG IJ SOLR
40.0000 mg | Freq: Two times a day (BID) | INTRAMUSCULAR | Status: DC
  Administered 2015-03-26 – 2015-03-27 (×2): 40 mg via INTRAVENOUS
  Filled 2015-03-26 (×2): qty 1

## 2015-03-26 MED ORDER — IPRATROPIUM-ALBUTEROL 0.5-2.5 (3) MG/3ML IN SOLN
3.0000 mL | Freq: Four times a day (QID) | RESPIRATORY_TRACT | Status: DC
  Administered 2015-03-26 – 2015-03-27 (×4): 3 mL via RESPIRATORY_TRACT
  Filled 2015-03-26 (×4): qty 3

## 2015-03-26 MED ORDER — LEVOFLOXACIN IN D5W 250 MG/50ML IV SOLN: 250.0000 mg | INTRAVENOUS | Status: DC

## 2015-03-26 MED ORDER — FUROSEMIDE 10 MG/ML IJ SOLN
60.0000 mg | Freq: Once | INTRAMUSCULAR | Status: AC
  Administered 2015-03-26: 60 mg via INTRAVENOUS
  Filled 2015-03-26: qty 6

## 2015-03-26 NOTE — Progress Notes (Signed)
ANTIBIOTIC CONSULT NOTE - INITIAL  Pharmacy Consult for Levaquin  Indication: Bronchitis   No Known Allergies  Patient Measurements: Height: 6\' 3"  (190.5 cm) Weight: 182 lb 15.7 oz (83 kg) IBW/kg (Calculated) : 84.5   Vital Signs: Temp: 98.9 F (37.2 C) (01/12 1219) Temp Source: Oral (01/12 1219) BP: 129/70 mmHg (01/12 1219) Pulse Rate: 78 (01/12 1219) Intake/Output from previous day: 01/11 0701 - 01/12 0700 In: 856.4 [P.O.:480; I.V.:376.4] Out: 350 [Urine:350] Intake/Output from this shift: Total I/O In: -  Out: 1000 [Other:1000]  Labs:  Recent Labs  03/24/15 0043 03/24/15 0508 03/25/15 0804 03/26/15 0505  WBC  --  6.8 5.7 8.0  HGB 8.0* 7.9* 7.8* 8.3*  PLT  --  227 211 232  CREATININE 6.68*  --  5.34* 7.31*   Estimated Creatinine Clearance: 13.4 mL/min (by C-G formula based on Cr of 7.31). No results for input(s): VANCOTROUGH, VANCOPEAK, VANCORANDOM, GENTTROUGH, GENTPEAK, GENTRANDOM, TOBRATROUGH, TOBRAPEAK, TOBRARND, AMIKACINPEAK, AMIKACINTROU, AMIKACIN in the last 72 hours.   Microbiology: Recent Results (from the past 720 hour(s))  Culture, group A strep     Status: None (Preliminary result)   Collection Time: 03/25/15  5:20 PM  Result Value Ref Range Status   Specimen Description THROAT  Final   Special Requests NONE  Final   Culture NO BETA HEMOLYTIC STREPTOCOCCI ISOLATED  Final   Report Status PENDING  Incomplete    Medical History: Past Medical History  Diagnosis Date  . Coronary artery disease   . Hypertension   . GERD (gastroesophageal reflux disease)   . Suicidal ideation     & HOMICIDAL IDEATION --  06-16-2013   ADMITTED TO BEHAVIOR HEALTH  . Alcohol abuse   . Drug abuse   . Diabetic peripheral neuropathy ([place])   . End stage renal disease on dialysis [facility]) NEPHROLOGIST-   DR [facility]  IN [place]    HEMODIALYSIS --   TUES/  THURS/  SAT    Medications:  Scheduled:  . amLODipine  5 mg Oral Daily  . atorvastatin  40 mg Oral QPC supper   . benazepril  10 mg Oral Daily  . calcium acetate  2,001 mg Oral TID WC  . cinacalcet  30 mg Oral Q breakfast  . epoetin (EPOGEN/PROCRIT) injection  4,000 Units Intravenous Q T,Th,Sa-HD  . gabapentin  300 mg Oral Daily  . insulin aspart  0-5 Units Subcutaneous QHS  . insulin aspart  0-9 Units Subcutaneous TID WC  . ipratropium-albuterol  3 mL Nebulization Q6H  . labetalol  200 mg Oral Daily  . [START ON 03/28/2015] levofloxacin (LEVAQUIN) IV  250 mg Intravenous Q48H  . levofloxacin (LEVAQUIN) IV  500 mg Intravenous Once  . methylPREDNISolone (SOLU-MEDROL) injection  40 mg Intravenous Q12H  . multivitamin with minerals  1 tablet Oral Daily  . pantoprazole (PROTONIX) IV  40 mg Intravenous Q12H  . sodium chloride  3 mL Intravenous Q12H   Infusions:  . octreotide  (SANDOSTATIN)    IV infusion 50 mcg/hr (03/25/15 2125)   Assessment: 55 y/o M with ESRD on HD admitted with GIB to begin Levaquin for bronchitis.   Plan:  Will begin Levaquin 500 mg iv once then 250 mg iv q 48 hours.   Pharmacy will continue to monitor and adjust per consult.   Ulice Dash D 03/26/2015,12:53 PM

## 2015-03-26 NOTE — Progress Notes (Signed)
Diamond MD notified of fluid overload - placed order for lasix. Will continue to assess.

## 2015-03-26 NOTE — Progress Notes (Signed)
Subjective:  Patient known to our practice from outpatient hemodialysis Currently he is followed by Dr. Holley Raring at [facility] dialysis center Extra dialysis done yesterday January 10 He underwent paracentesis this admission. 5.6 L  fluid was removed Underwent EGD this admission Patient seen during dialysis today. Tolerating well UF goal had to be decreased because of cramping Patient reports excessive coughing with sputum production    HEMODIALYSIS FLOWSHEET:  Blood Flow Rate (mL/min): 400 mL/min Arterial Pressure (mmHg): -180 mmHg Venous Pressure (mmHg): 150 mmHg Transmembrane Pressure (mmHg): 40 mmHg Ultrafiltration Rate (mL/min): 830 mL/min Dialysate Flow Rate (mL/min): 600 ml/min Dialysis Fluid Bolus: Normal Saline Bolus Amount (mL): 250 mL Intra-Hemodialysis Comments: 118ml.Marland KitchenMarland KitchenAlert,talking with Dr.Jigar Zielke bedside.     Objective:  Vital signs in last 24 hours:  Temp:  [98.9 F (37.2 C)-101 F (38.3 C)] 98.9 F (37.2 C) (01/12 1219) Pulse Rate:  [70-81] 78 (01/12 1219) Resp:  [19-30] 26 (01/12 1219) BP: (114-146)/(61-105) 129/70 mmHg (01/12 1219) SpO2:  [96 %-100 %] 100 % (01/12 1219) Weight:  [83 kg (182 lb 15.7 oz)-84.2 kg (185 lb 10 oz)] 83 kg (182 lb 15.7 oz) (01/12 1219)  Weight change:  Filed Weights   03/24/15 1809 03/26/15 0900 03/26/15 1219  Weight: 82.5 kg (181 lb 14.1 oz) 84.2 kg (185 lb 10 oz) 83 kg (182 lb 15.7 oz)    Intake/Output:    Intake/Output Summary (Last 24 hours) at 03/26/15 1239 Last data filed at 03/26/15 1219  Gross per 24 hour  Intake 856.39 ml  Output   1100 ml  Net -243.61 ml     Physical Exam: General:  alert, chronically ill-appearing   HEENT  moist oral mucous membranes   Neck  supple   Pulm/lungs  normal respiratory effort, diffuse rhonchi and crackles   CVS/Heart  regular rhythm, no rub or gallop   Abdomen:   distended from ascites   Extremities: + peripheral edema   Neurologic:  alert, nonfocal   Skin:  no acute  rashes   Access:  AV fistula        Basic Metabolic Panel:   Recent Labs Lab 03/23/15 0528 03/24/15 0043 03/25/15 0804 03/26/15 0505  NA 139 136 136 135  K 5.8* 5.7* 4.6 5.2*  CL 94* 97* 97* 96*  CO2 35* 32 32 30  GLUCOSE 98 138* 92 97  BUN 72* 40* 30* 43*  CREATININE 10.17* 6.68* 5.34* 7.31*  CALCIUM 12.8* 9.9 8.5* 8.5*  PHOS  --   --   --  4.9*     CBC:  Recent Labs Lab 03/23/15 0528 03/23/15 1136 03/24/15 0043 03/24/15 0508 03/25/15 0804 03/26/15 0505  WBC 8.7 7.1  --  6.8 5.7 8.0  NEUTROABS  --   --   --  5.3  --   --   HGB 7.7* 6.7*  6.7* 8.0* 7.9* 7.8* 8.3*  HCT 24.4* 21.9*  21.9*  --  25.8* 25.7* 26.6*  MCV 94.8 93.9  --  94.3 93.7 93.9  PLT 289 237  --  227 211 232      Microbiology:  Recent Results (from the past 720 hour(s))  Culture, group A strep     Status: None (Preliminary result)   Collection Time: 03/25/15  5:20 PM  Result Value Ref Range Status   Specimen Description THROAT  Final   Special Requests NONE  Final   Culture NO BETA HEMOLYTIC STREPTOCOCCI ISOLATED  Final   Report Status PENDING  Incomplete    Coagulation  Studies:  Recent Labs  03/24/15 0508  LABPROT 15.6*  INR 1.22    Urinalysis: No results for input(s): COLORURINE, LABSPEC, PHURINE, GLUCOSEU, HGBUR, BILIRUBINUR, KETONESUR, PROTEINUR, UROBILINOGEN, NITRITE, LEUKOCYTESUR in the last 72 hours.  Invalid input(s): APPERANCEUR    Imaging: Dg Chest 1 View  03/25/2015  CLINICAL DATA:  Fever. Cad, htn, end stage renal disease on dialysis. Coronary artery bypass graft 2008. EXAM: CHEST 1 VIEW COMPARISON:  03/23/2015 FINDINGS: Status post median sternotomy and CABG. Heart is enlarged. There is pulmonary vascular congestion. No overt alveolar edema identified. There are no consolidations or pleural effusions. IMPRESSION: 1. Postoperative changes. 2. Cardiomegaly and vascular congestion. Electronically Signed   By: Nolon Nations M.D.   On: 03/25/2015 16:01      Medications:   . octreotide  (SANDOSTATIN)    IV infusion 50 mcg/hr (03/25/15 2125)   . amLODipine  5 mg Oral Daily  . atorvastatin  40 mg Oral QPC supper  . benazepril  10 mg Oral Daily  . calcium acetate  2,001 mg Oral TID WC  . cinacalcet  30 mg Oral Q breakfast  . epoetin (EPOGEN/PROCRIT) injection  4,000 Units Intravenous Q T,Th,Sa-HD  . gabapentin  300 mg Oral Daily  . insulin aspart  0-5 Units Subcutaneous QHS  . insulin aspart  0-9 Units Subcutaneous TID WC  . ipratropium-albuterol  3 mL Nebulization Q6H  . labetalol  200 mg Oral Daily  . methylPREDNISolone (SOLU-MEDROL) injection  40 mg Intravenous Q12H  . multivitamin with minerals  1 tablet Oral Daily  . pantoprazole (PROTONIX) IV  40 mg Intravenous Q12H  . sodium chloride  3 mL Intravenous Q12H   acetaminophen **OR** acetaminophen, alum & mag hydroxide-simeth, hydrALAZINE, HYDROcodone-acetaminophen, menthol-cetylpyridinium, morphine injection, ondansetron **OR** ondansetron (ZOFRAN) IV, senna-docusate  Assessment/ Plan:  55 y.o. male with end-stage renal disease, dialysis at [facility], coronary disease, hypertension, history of alcohol abuse, history of drug abuse,   1. End-stage renal disease - will follow Tuesday/Thursday/Saturday schedule - Patient seen during dialysis Tolerating well   2 Ascites from liver cirrhosis - ultrasound-guided paracentesis  - 5.5 L of fluid was removed  3. Secondary hyperparathyroidism Continue PhosLo and Sensipar once he resumes normal by mouth intake  4. Severe anemia of chronic kidney disease and blood loss Continue Procrit Blood transfusion given during dialysis GI evaluation ongoing  5.  Hyperkalemia  improved with dialysis    LOS: 3 Harlee Pursifull 1/12/201712:39 PM

## 2015-03-26 NOTE — Progress Notes (Addendum)
Pt woke up with an increased work of breathing - breath sounds more wheezy and with crackles - different than start of shift, chest sounds tight - RT called to come assess pt. Resp 28, slight temp.

## 2015-03-26 NOTE — Progress Notes (Signed)
This note also relates to the following rows which could not be included: Pulse Rate - Cannot attach notes to unvalidated device data Resp - Cannot attach notes to unvalidated device data BP - Cannot attach notes to unvalidated device data SpO2 - Cannot attach notes to unvalidated device data   Hemodialysis start.

## 2015-03-26 NOTE — Progress Notes (Signed)
[facility] [place] NAME: Stanley [name]    MR#:  [id number]  DATE OF BIRTH:  12-31-1960  SUBJECTIVE:  Patient seen Dialysis. He was having respiratory issues and was given DuoNeb's. He is feeling more comfortable. He continues to have some wheezing.  REVIEW OF SYSTEMS:    Review of Systems  Constitutional: Negative for fever, chills and malaise/fatigue.  HENT: Negative for sore throat.   Eyes: Negative for blurred vision.  Respiratory: Positive for shortness of breath and wheezing. Negative for cough and hemoptysis.   Cardiovascular: Negative for chest pain, palpitations and leg swelling.  Gastrointestinal: Negative for nausea, vomiting, abdominal pain, diarrhea and blood in stool.  Genitourinary: Negative for dysuria.  Musculoskeletal: Negative for back pain.  Neurological: Negative for dizziness, tremors and headaches.  Endo/Heme/Allergies: Does not bruise/bleed easily.    Tolerating Diet: yes      DRUG ALLERGIES:  No Known Allergies  VITALS:  Blood pressure 130/105, pulse 78, temperature 100 F (37.8 C), temperature source Oral, resp. rate 30, height 6\' 3"  (1.905 m), weight 84.2 kg (185 lb 10 oz), SpO2 100 %.  PHYSICAL EXAMINATION:   Physical Exam  Constitutional: He is oriented to person, place, and time and well-developed, well-nourished, and in no distress. No distress.  HENT:  Head: Normocephalic.  Eyes: No scleral icterus.  Neck: Normal range of motion. Neck supple. No JVD present. No tracheal deviation present.  Cardiovascular: Normal rate, regular rhythm and normal heart sounds.  Exam reveals no gallop and no friction rub.   No murmur heard. Pulmonary/Chest: Effort normal. No respiratory distress. He has wheezes. He has no rales. He exhibits no tenderness.  Abdominal: Soft. Bowel sounds are normal. He exhibits no distension and no mass. There is no tenderness. There is no rebound and no guarding.   Musculoskeletal: Normal range of motion. He exhibits no edema.  Neurological: He is alert and oriented to person, place, and time.  Skin: Skin is warm. No rash noted. No erythema.  Psychiatric: Affect and judgment normal.      LABORATORY PANEL:   CBC  Recent Labs Lab 03/26/15 0505  WBC 8.0  HGB 8.3*  HCT 26.6*  PLT 232   ------------------------------------------------------------------------------------------------------------------  Chemistries   Recent Labs Lab 03/24/15 0043  03/26/15 0505  NA 136  < > 135  K 5.7*  < > 5.2*  CL 97*  < > 96*  CO2 32  < > 30  GLUCOSE 138*  < > 97  BUN 40*  < > 43*  CREATININE 6.68*  < > 7.31*  CALCIUM 9.9  < > 8.5*  AST 28  --   --   ALT 23  --   --   ALKPHOS 151*  --   --   BILITOT 1.0  --   --   < > = values in this interval not displayed. ------------------------------------------------------------------------------------------------------------------  Cardiac Enzymes  Recent Labs Lab 03/23/15 1136 03/23/15 1700 03/24/15 0043  TROPONINI 0.07* 0.07* 0.07*   ------------------------------------------------------------------------------------------------------------------  RADIOLOGY:  Dg Chest 1 View  03/25/2015  CLINICAL DATA:  Fever. Cad, htn, end stage renal disease on dialysis. Coronary artery bypass graft 2008. EXAM: CHEST 1 VIEW COMPARISON:  03/23/2015 FINDINGS: Status post median sternotomy and CABG. Heart is enlarged. There is pulmonary vascular congestion. No overt alveolar edema identified. There are no consolidations or pleural effusions. IMPRESSION: 1. Postoperative changes. 2. Cardiomegaly and vascular congestion. Electronically Signed   By: Nolon Nations M.D.  On: 03/25/2015 16:01     ASSESSMENT AND PLAN:    55 year old male with end-stage renal disease on hemodialysis, CAD and liver cirrhosis from EtOH abuse who presents with shortness of breath and found to have anemia with guaiac positive  stools.  1. GI bleed: Patient has been seen and evaluated by GI. Plan is for EGD tomorrow. Patient is on octreotide and protonix. Patient has acute on chronic iron deficiency anemia. He is status post 1 unit of PRBCs. Continue to monitor hemoglobin. Hemoglobin has been stable.  2. [name]: Continue hemodialysis as per scheduleTuesday, Thursday and Saturday.  3. Liver cirrhosis with ascites: He is status post ultrasound-guided paracentesis with 5-1/2 L drained. Marland Kitchen He is dependent on paracentesis. According to him the patient he is also being recommended for TIPS procedure at [facility].  4. CAD: Continue Norvasc, Benzapril, labetalol. Hold aspirin for now.  5. EtOH abuse: Patient is now in remission  6. Wheezing: I suspect this is related to vascular congestion. I will start IV steroids and continue nebulizer treatment. Monitor hemoglobin while on steroids.   Management plans discussed with the patient and he is in agreement.  CODE STATUS: Full  TOTAL TIME TAKING CARE OF THIS PATIENT: 25 minutes.     POSSIBLE D/C tomorrow, DEPENDING ON EGD  Allanah Mcfarland M.D on 03/26/2015 at 12:11 PM  Between 7am to 6pm - Pager - [phone] After 6pm go to [email or web address] - password [email or web address] [place] [facility]  Office  [phone]  CC: Primary care physician; [place]  Note: This dictation was prepared with Dragon dictation along with smaller phrase technology. Any transcriptional errors that result from this process are unintentional.

## 2015-03-26 NOTE — Care Management Note (Signed)
Patient is active at [facility] on a TTS schedule.  Admission records have been sent to the clinic and additional records will be sent at discharge.  Iran Sizer  Dialysis Liaison  [phone]

## 2015-03-26 NOTE — Clinical Documentation Improvement (Signed)
Internal Medicine Please update your documentation within the medical record to reflect your response to this query.  Thank you Can the diagnosis of anemia..."blood loss" be further specified?  Possible Conditions:  Acute blood loss Anemia  Chronic blood loss Anemia, including the suspected or known cause  Anemia of chronic disease, including the associated chronic disease state  Other  Clinically Undetermined  Document any associated diagnoses/conditions.  Supporting Information: 03/26/15 progr note.Marland KitchenMarland Kitchen"Severe anemia of chronic kidney disease and blood loss"..."Continue Procrit. Blood transfusion given during dialysis. GI evaluation ongoing.".Marland KitchenMarland Kitchen  Please exercise your independent, professional judgment when responding. A specific answer is not anticipated or expected.  Thank You,  Ermelinda Das, RN, BSN, [place] Certified Clinical Documentation Specialist [place]: Health Information Management [phone]

## 2015-03-26 NOTE — Progress Notes (Signed)
Pt stated that he is feeling better. Breathing has improved.

## 2015-03-26 NOTE — Progress Notes (Signed)
Post hd tx 

## 2015-03-26 NOTE — Consult Note (Addendum)
Subjective: Patient seen for anmeia, reported hematemesis and black stools.  No bm over the past 72 hours.  Denies nausea or abdominal pain.  Tolerating regular diet.   States his breathing is not good.   Objective: Vital signs in last 24 hours: Temp:  [98.9 F (37.2 C)-100.1 F (37.8 C)] 98.9 F (37.2 C) (01/12 1219) Pulse Rate:  [70-81] 78 (01/12 1219) Resp:  [19-30] 26 (01/12 1219) BP: (121-146)/(61-105) 129/70 mmHg (01/12 1219) SpO2:  [97 %-100 %] 100 % (01/12 1219) Weight:  [83 kg (182 lb 15.7 oz)-84.2 kg (185 lb 10 oz)] 83 kg (182 lb 15.7 oz) (01/12 1219) Blood pressure 129/70, pulse 78, temperature 98.9 F (37.2 C), temperature source Oral, resp. rate 26, height 6\' 3"  (1.905 m), weight 83 kg (182 lb 15.7 oz), SpO2 100 %.   Intake/Output from previous day: 01/11 0701 - 01/12 0700 In: 856.4 [P.O.:480; I.V.:376.4] Out: 350 [Urine:350]  Intake/Output this shift: Total I/O In: -  Out: 1000 [Other:1000]   General appearance:  54 male, no acute distress.  Resp:  Coarse rhonchi and wheezing bilaterally.  Cardio:  rrr GI:  Protuberant, soft, mild distension, non-tender. bs positive Extremities:  No cce./    Lab Results: Results for orders placed or performed during the hospital encounter of 03/23/15 (from the past 24 hour(s))  Glucose, capillary     Status: Abnormal   Collection Time: 03/25/15  4:38 PM  Result Value Ref Range   Glucose-Capillary 134 (H) 65 - 99 mg/dL   Comment 1 Notify RN   Culture, group A strep     Status: None (Preliminary result)   Collection Time: 03/25/15  5:20 PM  Result Value Ref Range   Specimen Description THROAT    Special Requests NONE    Culture NO BETA HEMOLYTIC STREPTOCOCCI ISOLATED    Report Status PENDING   Glucose, capillary     Status: Abnormal   Collection Time: 03/25/15  9:04 PM  Result Value Ref Range   Glucose-Capillary 146 (H) 65 - 99 mg/dL  Renal function panel     Status: Abnormal   Collection Time: 03/26/15  5:05 AM   Result Value Ref Range   Sodium 135 135 - 145 mmol/L   Potassium 5.2 (H) 3.5 - 5.1 mmol/L   Chloride 96 (L) 101 - 111 mmol/L   CO2 30 22 - 32 mmol/L   Glucose, Bld 97 65 - 99 mg/dL   BUN 43 (H) 6 - 20 mg/dL   Creatinine, Ser 7.31 (H) 0.61 - 1.24 mg/dL   Calcium 8.5 (L) 8.9 - 10.3 mg/dL   Phosphorus 4.9 (H) 2.5 - 4.6 mg/dL   Albumin 3.0 (L) 3.5 - 5.0 g/dL   GFR calc non Af Amer 7 (L) >60 mL/min   GFR calc Af Amer 9 (L) >60 mL/min   Anion gap 9 5 - 15  CBC     Status: Abnormal   Collection Time: 03/26/15  5:05 AM  Result Value Ref Range   WBC 8.0 3.8 - 10.6 K/uL   RBC 2.83 (L) 4.40 - 5.90 MIL/uL   Hemoglobin 8.3 (L) 13.0 - 18.0 g/dL   HCT 26.6 (L) 40.0 - 52.0 %   MCV 93.9 80.0 - 100.0 fL   MCH 29.5 26.0 - 34.0 pg   MCHC 31.4 (L) 32.0 - 36.0 g/dL   RDW 18.2 (H) 11.5 - 14.5 %   Platelets 232 150 - 440 K/uL  Glucose, capillary     Status: Abnormal  Collection Time: 03/26/15  8:07 AM  Result Value Ref Range   Glucose-Capillary 101 (H) 65 - 99 mg/dL   Comment 1 Notify RN       Recent Labs  03/24/15 0508 03/25/15 0804 03/26/15 0505  WBC 6.8 5.7 8.0  HGB 7.9* 7.8* 8.3*  HCT 25.8* 25.7* 26.6*  PLT 227 211 232   BMET  Recent Labs  03/24/15 0043 03/25/15 0804 03/26/15 0505  NA 136 136 135  K 5.7* 4.6 5.2*  CL 97* 97* 96*  CO2 32 32 30  GLUCOSE 138* 92 97  BUN 40* 30* 43*  CREATININE 6.68* 5.34* 7.31*  CALCIUM 9.9 8.5* 8.5*   LFT  Recent Labs  03/24/15 0043 03/26/15 0505  PROT 7.2  --   ALBUMIN 3.3* 3.0*  AST 28  --   ALT 23  --   ALKPHOS 151*  --   BILITOT 1.0  --    PT/INR  Recent Labs  03/24/15 0508  LABPROT 15.6*  INR 1.22   Hepatitis Panel No results for input(s): HEPBSAG, HCVAB, HEPAIGM, HEPBIGM in the last 72 hours. C-Diff No results for input(s): CDIFFTOX in the last 72 hours. No results for input(s): CDIFFPCR in the last 72 hours.   Studies/Results: Dg Chest 1 View  03/25/2015  CLINICAL DATA:  Fever. Cad, htn, end stage renal  disease on dialysis. Coronary artery bypass graft 2008. EXAM: CHEST 1 VIEW COMPARISON:  03/23/2015 FINDINGS: Status post median sternotomy and CABG. Heart is enlarged. There is pulmonary vascular congestion. No overt alveolar edema identified. There are no consolidations or pleural effusions. IMPRESSION: 1. Postoperative changes. 2. Cardiomegaly and vascular congestion. Electronically Signed   By: Nolon Nations M.D.   On: 03/25/2015 16:01    Scheduled Inpatient Medications:   . amLODipine  5 mg Oral Daily  . atorvastatin  40 mg Oral QPC supper  . benazepril  10 mg Oral Daily  . calcium acetate  2,001 mg Oral TID WC  . cinacalcet  30 mg Oral Q breakfast  . epoetin (EPOGEN/PROCRIT) injection  4,000 Units Intravenous Q T,Th,Sa-HD  . gabapentin  300 mg Oral Daily  . insulin aspart  0-5 Units Subcutaneous QHS  . insulin aspart  0-9 Units Subcutaneous TID WC  . ipratropium-albuterol  3 mL Nebulization Q6H  . labetalol  200 mg Oral Daily  . [START ON 03/28/2015] levofloxacin (LEVAQUIN) IV  250 mg Intravenous Q48H  . levofloxacin (LEVAQUIN) IV  500 mg Intravenous Once  . methylPREDNISolone (SOLU-MEDROL) injection  40 mg Intravenous Q12H  . multivitamin with minerals  1 tablet Oral Daily  . pantoprazole (PROTONIX) IV  40 mg Intravenous Q12H  . sodium chloride  3 mL Intravenous Q12H    Continuous Inpatient Infusions:   . octreotide  (SANDOSTATIN)    IV infusion 50 mcg/hr (03/25/15 2125)    PRN Inpatient Medications:  acetaminophen **OR** acetaminophen, alum & mag hydroxide-simeth, hydrALAZINE, HYDROcodone-acetaminophen, menthol-cetylpyridinium, morphine injection, ondansetron **OR** ondansetron (ZOFRAN) IV, senna-docusate  Miscellaneous:   Assessment:  1) anemia.  Multifactorial with GI loss and ESRD. Heme positive. Reported hematemesis and black stools pta.  None since admission.    Plan:  1) continue current.  Unable to do egd at this poitndue to URI/respiratory status./  Hemogram  stable.  No evidence of active bleeding.  He will need to have a egd and  Colonoscopy at some point but should have improved respiratory status. Discussed with Dr Mody./  Can do GI fu as op as long as  hemogram stable and no evidence of acute bleeding. No nsaids,.  Lollie Sails MD 03/26/2015, 1:30 PM

## 2015-03-27 LAB — CBC
HCT: 24.7 % — ABNORMAL LOW (ref 40.0–52.0)
Hemoglobin: 7.8 g/dL — ABNORMAL LOW (ref 13.0–18.0)
MCH: 29.3 pg (ref 26.0–34.0)
MCHC: 31.3 g/dL — ABNORMAL LOW (ref 32.0–36.0)
MCV: 93.4 fL (ref 80.0–100.0)
PLATELETS: 185 10*3/uL (ref 150–440)
RBC: 2.65 MIL/uL — ABNORMAL LOW (ref 4.40–5.90)
RDW: 17.9 % — AB (ref 11.5–14.5)
WBC: 3.4 10*3/uL — ABNORMAL LOW (ref 3.8–10.6)

## 2015-03-27 LAB — CULTURE, GROUP A STREP (THRC)

## 2015-03-27 LAB — GLUCOSE, CAPILLARY: Glucose-Capillary: 147 mg/dL — ABNORMAL HIGH (ref 65–99)

## 2015-03-27 MED ORDER — PREDNISONE 10 MG PO TABS: 10.0000 mg | ORAL_TABLET | Freq: Every day | ORAL | Status: DC

## 2015-03-27 MED ORDER — GABAPENTIN 300 MG PO CAPS: 300.0000 mg | ORAL_CAPSULE | Freq: Every day | ORAL | Status: DC

## 2015-03-27 MED ORDER — LEVOFLOXACIN 250 MG PO TABS: 250.0000 mg | ORAL_TABLET | ORAL | Status: DC

## 2015-03-27 NOTE — Discharge Summary (Addendum)
[facility] at [place] NAME: Stanley [name]    MR#:  [id number]  DATE OF BIRTH:  [birth year]  DATE OF ADMISSION:  03/23/2015 ADMITTING PHYSICIAN: Bettey Costa, MD  DATE OF DISCHARGE: 03/27/2015  PRIMARY CARE PHYSICIAN: [facility]    ADMISSION DIAGNOSIS:  Dyspnea [R06.00] Gastrointestinal hemorrhage, unspecified gastritis, unspecified gastrointestinal hemorrhage type [K92.2]  DISCHARGE DIAGNOSIS:  Active Problems:   GIB (gastrointestinal bleeding)   SECONDARY DIAGNOSIS:   Past Medical History  Diagnosis Date  . Coronary artery disease   . Hypertension   . GERD (gastroesophageal reflux disease)   . Suicidal ideation     & HOMICIDAL IDEATION --  06-16-2013   ADMITTED TO BEHAVIOR HEALTH  . Alcohol abuse   . Drug abuse   . Diabetic peripheral neuropathy ([place])   . End stage renal disease on dialysis [facility]) NEPHROLOGIST-   DR [facility]  IN [place]    HEMODIALYSIS --   TUES/  THURS/  SAT    HOSPITAL COURSE:  55 year old male with end-stage renal disease on hemodialysis, CAD and liver cirrhosis from EtOH abuse who presents with shortness of breath and found to have anemia with guaiac positive stools.  1. GI bleed: Patient has been seen and evaluated by GI. Patient was started on octreotide and protonix. Patient has acute blood loss on chronic iron deficiency anemia. He is status post 1 unit of PRBCs. Continue to monitor hemoglobin. Hemoglobin has been stable.  2. [name]: Continue hemodialysis as per scheduleTuesday, Thursday and Saturday.  3. Liver cirrhosis with ascites: He is status post ultrasound-guided paracentesis with 5-1/2 L drained. Marland Kitchen He is dependent on paracentesis. According to him the patient he is also being recommended for T[facility] procedure at [facility].  4. CAD: Continue Norvasc, Benzapril, labetalol. Hold aspirin for now.  5. EtOH abuse: Patient is now in remission  6. Wheezing:  His wheezing has improved with the addition of Levaquin for possible bronchitis and steroids. Patient does not have a formal diagnosis COPD but may have underlying COPD given his history of smoking dependence. Patient's lung examination at discharge is clear to all sedation.   DISCHARGE CONDITIONS AND DIET:  Stable for discharge on a renal diet  CONSULTS OBTAINED:  Treatment Team:  Lollie Sails, MD  DRUG ALLERGIES:  No Known Allergies  DISCHARGE MEDICATIONS:   Current Discharge Medication List    START taking these medications   Details  levofloxacin (LEVAQUIN) 250 MG tablet Take 1 tablet (250 mg total) by mouth every other day. Qty: 3 tablet, Refills: 0    pantoprazole (PROTONIX) 40 MG tablet Take 1 tablet (40 mg total) by mouth daily. Qty: 30 tablet, Refills: 0    predniSONE (DELTASONE) 10 MG tablet Take 1 tablet (10 mg total) by mouth daily with breakfast. 40 mg PO (BY MOUTH)  x 2 days 30 mg PO x 2 days 20 mg PO x 2 days 10 mg PO x 2 days then stop Qty: 20 tablet, Refills: 0      CONTINUE these medications which have CHANGED   Details  gabapentin (NEURONTIN) 300 MG capsule Take 1 capsule (300 mg total) by mouth daily. Qty: 30 capsule, Refills: 0      CONTINUE these medications which have NOT CHANGED   Details  amLODipine (NORVASC) 5 MG tablet Take 5 mg by mouth daily.     atorvastatin (LIPITOR) 40 MG tablet Take 40 mg by mouth daily.  benazepril (LOTENSIN) 10 MG tablet Take 10 mg by mouth daily.    calcium acetate (PHOSLO) 667 MG capsule Take 2,001 mg by mouth 3 (three) times daily with meals.    cinacalcet (SENSIPAR) 30 MG tablet Take 1 tablet by mouth daily.    labetalol (NORMODYNE) 200 MG tablet Take 200 mg by mouth daily.     Multiple Vitamins-Minerals (MULTIVITAMIN WITH MINERALS) tablet Take 1 tablet by mouth daily.      STOP taking these medications     aspirin EC 81 MG tablet      omeprazole (PRILOSEC) 20 MG capsule                Today   CHIEF COMPLAINT:  Patient is doing well this morning. He has 98% on room air.   VITAL SIGNS:  Blood pressure 127/83, pulse 81, temperature 97.9 F (36.6 C), temperature source Oral, resp. rate 17, height 6\' 3"  (1.905 m), weight 83 kg (182 lb 15.7 oz), SpO2 98 %.   REVIEW OF SYSTEMS:  Review of Systems  Constitutional: Negative for fever, chills and malaise/fatigue.  HENT: Negative for sore throat.   Eyes: Negative for blurred vision.  Respiratory: Negative for cough, hemoptysis, shortness of breath and wheezing.   Cardiovascular: Negative for chest pain, palpitations and leg swelling.  Gastrointestinal: Negative for nausea, vomiting, abdominal pain, diarrhea and blood in stool.  Genitourinary: Negative for dysuria.  Musculoskeletal: Negative for back pain.  Neurological: Negative for dizziness, tremors and headaches.  Endo/Heme/Allergies: Does not bruise/bleed easily.     PHYSICAL EXAMINATION:  GENERAL:  55 y.o.-year-old patient lying in the bed with no acute distress.  NECK:  Supple, no jugular venous distention. No thyroid enlargement, no tenderness.  LUNGS: Normal breath sounds bilaterally, no wheezing, rales,rhonchi  No use of accessory muscles of respiration.  CARDIOVASCULAR: S1, S2 normal. No murmurs, rubs, or gallops.  ABDOMEN: Soft, non-tender, non-distended. Bowel sounds present. No organomegaly or mass.  EXTREMITIES: No pedal edema, cyanosis, or clubbing.  PSYCHIATRIC: The patient is alert and oriented x 3.  SKIN: No obvious rash, lesion, or ulcer.   DATA REVIEW:   CBC  Recent Labs Lab 03/27/15 0618  WBC 3.4*  HGB 7.8*  HCT 24.7*  PLT 185    Chemistries   Recent Labs Lab 03/24/15 0043  03/26/15 0505  NA 136  < > 135  K 5.7*  < > 5.2*  CL 97*  < > 96*  CO2 32  < > 30  GLUCOSE 138*  < > 97  BUN 40*  < > 43*  CREATININE 6.68*  < > 7.31*  CALCIUM 9.9  < > 8.5*  AST 28  --   --   ALT 23  --   --   ALKPHOS 151*  --   --    BILITOT 1.0  --   --   < > = values in this interval not displayed.  Cardiac Enzymes  Recent Labs Lab 03/23/15 1136 03/23/15 1700 03/24/15 0043  TROPONINI 0.07* 0.07* 0.07*    Microbiology Results  @[id number] @  RADIOLOGY:  Dg Chest 1 View  03/25/2015  CLINICAL DATA:  Fever. Cad, htn, end stage renal disease on dialysis. Coronary artery bypass graft 2008. EXAM: CHEST 1 VIEW COMPARISON:  03/23/2015 FINDINGS: Status post median sternotomy and CABG. Heart is enlarged. There is pulmonary vascular congestion. No overt alveolar edema identified. There are no consolidations or pleural effusions. IMPRESSION: 1. Postoperative changes. 2. Cardiomegaly and vascular congestion. Electronically Signed  By: Nolon Nations M.D.   On: 03/25/2015 16:01   Dg Chest 2 View  03/26/2015  CLINICAL DATA:  Productive cough for several months. Low-grade fever. End-stage renal disease on dialysis. EXAM: CHEST  2 VIEW COMPARISON:  03/25/2015 FINDINGS: Moderate cardiomegaly is stable. Chronic pulmonary vascular congestion is stable. No evidence of pulmonary airspace disease or frank pulmonary edema. No evidence of pleural effusion. Prior CABG again noted. IMPRESSION: Stable cardiomegaly and chronic pulmonary venous hypertension. No acute findings. Electronically Signed   By: Earle Gell M.D.   On: 03/26/2015 14:27      Management plans discussed with the patient and he is in agreement. Stable for discharge home  Patient should follow up with PCP a 1 week  CODE STATUS:     Code Status Orders        Start     Ordered   03/23/15 1108  Full code   Continuous     03/23/15 1107    Code Status History    Date Active Date Inactive Code Status Order ID Comments User Context   06/19/2013 11:32 AM 06/21/2013  5:49 PM Full Code [id number]  Cristal Ford, DO Inpatient   06/16/2013  5:26 PM 06/19/2013 11:32 AM Full Code [id number]  Margarita Mail, PA-C ED   06/05/2013  3:33 PM 06/06/2013  6:56 PM Full Code [id number]   Theodoro Kos, DO Inpatient   06/02/2013  9:01 AM 06/05/2013  3:33 PM Full Code [id number]  Velvet Bathe, MD Inpatient   06/02/2013  8:40 AM 06/02/2013  9:01 AM Full Code [id number]  Velvet Bathe, MD Inpatient      TOTAL TIME TAKING CARE OF THIS PATIENT: 35 minutes.    Note: This dictation was prepared with Dragon dictation along with smaller phrase technology. Any transcriptional errors that result from this process are unintentional.  Yanci Bachtell M.D on 03/27/2015 at 9:17 AM  Between 7am to 6pm - Pager - [phone] After 6pm go to [email or web address] - password [email or web address] [place] [facility]  Office  [phone]  CC: Primary care physician; [place]

## 2015-03-27 NOTE — Care Management Important Message (Signed)
Important Message  Patient Details  Name: Stanley [name] MRN: [id number] Date of Birth: [birth year]   Medicare Important Message Given:  Yes 2nd notice done    Beau Fanny, RN 03/27/2015, 8:48 AM

## 2015-03-27 NOTE — Progress Notes (Signed)
Pt is very adamant about leaving right away. Sister is here at bedside to provide transportation. Discharge instructions have been provided to the pt.

## 2015-03-28 DIAGNOSIS — D631 Anemia in chronic kidney disease: Secondary | ICD-10-CM | POA: Diagnosis not present

## 2015-03-28 DIAGNOSIS — Z992 Dependence on renal dialysis: Secondary | ICD-10-CM | POA: Diagnosis not present

## 2015-03-28 DIAGNOSIS — D509 Iron deficiency anemia, unspecified: Secondary | ICD-10-CM | POA: Diagnosis not present

## 2015-03-28 DIAGNOSIS — N2581 Secondary hyperparathyroidism of renal origin: Secondary | ICD-10-CM | POA: Diagnosis not present

## 2015-03-28 DIAGNOSIS — N186 End stage renal disease: Secondary | ICD-10-CM | POA: Diagnosis not present

## 2015-03-31 DIAGNOSIS — N2581 Secondary hyperparathyroidism of renal origin: Secondary | ICD-10-CM | POA: Diagnosis not present

## 2015-03-31 DIAGNOSIS — N186 End stage renal disease: Secondary | ICD-10-CM | POA: Diagnosis not present

## 2015-03-31 DIAGNOSIS — D631 Anemia in chronic kidney disease: Secondary | ICD-10-CM | POA: Diagnosis not present

## 2015-03-31 DIAGNOSIS — Z992 Dependence on renal dialysis: Secondary | ICD-10-CM | POA: Diagnosis not present

## 2015-03-31 DIAGNOSIS — Z79899 Other long term (current) drug therapy: Secondary | ICD-10-CM | POA: Diagnosis not present

## 2015-03-31 DIAGNOSIS — D509 Iron deficiency anemia, unspecified: Secondary | ICD-10-CM | POA: Diagnosis not present

## 2015-04-02 DIAGNOSIS — N186 End stage renal disease: Secondary | ICD-10-CM | POA: Diagnosis not present

## 2015-04-02 DIAGNOSIS — N2581 Secondary hyperparathyroidism of renal origin: Secondary | ICD-10-CM | POA: Diagnosis not present

## 2015-04-02 DIAGNOSIS — Z992 Dependence on renal dialysis: Secondary | ICD-10-CM | POA: Diagnosis not present

## 2015-04-02 DIAGNOSIS — D509 Iron deficiency anemia, unspecified: Secondary | ICD-10-CM | POA: Diagnosis not present

## 2015-04-02 DIAGNOSIS — D631 Anemia in chronic kidney disease: Secondary | ICD-10-CM | POA: Diagnosis not present

## 2015-04-04 DIAGNOSIS — D509 Iron deficiency anemia, unspecified: Secondary | ICD-10-CM | POA: Diagnosis not present

## 2015-04-04 DIAGNOSIS — N2581 Secondary hyperparathyroidism of renal origin: Secondary | ICD-10-CM | POA: Diagnosis not present

## 2015-04-04 DIAGNOSIS — D631 Anemia in chronic kidney disease: Secondary | ICD-10-CM | POA: Diagnosis not present

## 2015-04-04 DIAGNOSIS — Z992 Dependence on renal dialysis: Secondary | ICD-10-CM | POA: Diagnosis not present

## 2015-04-04 DIAGNOSIS — N186 End stage renal disease: Secondary | ICD-10-CM | POA: Diagnosis not present

## 2015-04-05 ENCOUNTER — Encounter: Payer: Self-pay | Admitting: Emergency Medicine

## 2015-04-05 ENCOUNTER — Inpatient Hospital Stay
Admission: EM | Admit: 2015-04-05 | Discharge: 2015-04-07 | DRG: 682 | Disposition: A | Discharge status: Home / Self Care | Payer: Medicare Other | Attending: Internal Medicine | Admitting: Internal Medicine

## 2015-04-05 ENCOUNTER — Emergency Department: Payer: Medicare Other

## 2015-04-05 DIAGNOSIS — Z716 Tobacco abuse counseling: Secondary | ICD-10-CM

## 2015-04-05 DIAGNOSIS — R140 Abdominal distension (gaseous): Secondary | ICD-10-CM | POA: Diagnosis present

## 2015-04-05 DIAGNOSIS — Z79899 Other long term (current) drug therapy: Secondary | ICD-10-CM

## 2015-04-05 DIAGNOSIS — Z9889 Other specified postprocedural states: Secondary | ICD-10-CM

## 2015-04-05 DIAGNOSIS — E785 Hyperlipidemia, unspecified: Secondary | ICD-10-CM | POA: Diagnosis present

## 2015-04-05 DIAGNOSIS — Z992 Dependence on renal dialysis: Secondary | ICD-10-CM

## 2015-04-05 DIAGNOSIS — E1122 Type 2 diabetes mellitus with diabetic chronic kidney disease: Secondary | ICD-10-CM | POA: Diagnosis not present

## 2015-04-05 DIAGNOSIS — N2581 Secondary hyperparathyroidism of renal origin: Secondary | ICD-10-CM | POA: Diagnosis present

## 2015-04-05 DIAGNOSIS — N186 End stage renal disease: Secondary | ICD-10-CM

## 2015-04-05 DIAGNOSIS — I120 Hypertensive chronic kidney disease with stage 5 chronic kidney disease or end stage renal disease: Principal | ICD-10-CM | POA: Diagnosis present

## 2015-04-05 DIAGNOSIS — R0602 Shortness of breath: Secondary | ICD-10-CM | POA: Diagnosis not present

## 2015-04-05 DIAGNOSIS — D631 Anemia in chronic kidney disease: Secondary | ICD-10-CM | POA: Diagnosis present

## 2015-04-05 DIAGNOSIS — J441 Chronic obstructive pulmonary disease with (acute) exacerbation: Secondary | ICD-10-CM | POA: Diagnosis not present

## 2015-04-05 DIAGNOSIS — Z951 Presence of aortocoronary bypass graft: Secondary | ICD-10-CM

## 2015-04-05 DIAGNOSIS — E1142 Type 2 diabetes mellitus with diabetic polyneuropathy: Secondary | ICD-10-CM | POA: Diagnosis present

## 2015-04-05 DIAGNOSIS — I10 Essential (primary) hypertension: Secondary | ICD-10-CM | POA: Diagnosis present

## 2015-04-05 DIAGNOSIS — K7460 Unspecified cirrhosis of liver: Secondary | ICD-10-CM | POA: Diagnosis present

## 2015-04-05 DIAGNOSIS — F17210 Nicotine dependence, cigarettes, uncomplicated: Secondary | ICD-10-CM | POA: Diagnosis present

## 2015-04-05 DIAGNOSIS — R0600 Dyspnea, unspecified: Secondary | ICD-10-CM | POA: Diagnosis not present

## 2015-04-05 DIAGNOSIS — K219 Gastro-esophageal reflux disease without esophagitis: Secondary | ICD-10-CM | POA: Diagnosis present

## 2015-04-05 DIAGNOSIS — K7031 Alcoholic cirrhosis of liver with ascites: Secondary | ICD-10-CM | POA: Diagnosis present

## 2015-04-05 DIAGNOSIS — Z809 Family history of malignant neoplasm, unspecified: Secondary | ICD-10-CM

## 2015-04-05 DIAGNOSIS — I2510 Atherosclerotic heart disease of native coronary artery without angina pectoris: Secondary | ICD-10-CM | POA: Diagnosis present

## 2015-04-05 DIAGNOSIS — R188 Other ascites: Secondary | ICD-10-CM | POA: Diagnosis present

## 2015-04-05 DIAGNOSIS — E875 Hyperkalemia: Secondary | ICD-10-CM | POA: Diagnosis present

## 2015-04-05 HISTORY — DX: Unspecified cirrhosis of liver: K74.60

## 2015-04-05 LAB — BASIC METABOLIC PANEL
Anion gap: 11 (ref 5–15)
BUN: 49 mg/dL — AB (ref 6–20)
CALCIUM: 9.6 mg/dL (ref 8.9–10.3)
CO2: 27 mmol/L (ref 22–32)
CREATININE: 7.1 mg/dL — AB (ref 0.61–1.24)
Chloride: 104 mmol/L (ref 101–111)
GFR calc Af Amer: 9 mL/min — ABNORMAL LOW (ref >60)
GFR, EST NON AFRICAN AMERICAN: 8 mL/min — AB (ref >60)
GLUCOSE: 112 mg/dL — AB (ref 65–99)
POTASSIUM: 4.7 mmol/L (ref 3.5–5.1)
SODIUM: 142 mmol/L (ref 135–145)

## 2015-04-05 LAB — TROPONIN I: Troponin I: 0.05 ng/mL — ABNORMAL HIGH (ref <0.031)

## 2015-04-05 LAB — CBC
HEMATOCRIT: 26.4 % — AB (ref 40.0–52.0)
Hemoglobin: 8 g/dL — ABNORMAL LOW (ref 13.0–18.0)
MCH: 28.8 pg (ref 26.0–34.0)
MCHC: 30.4 g/dL — AB (ref 32.0–36.0)
MCV: 94.9 fL (ref 80.0–100.0)
PLATELETS: 279 10*3/uL (ref 150–440)
RBC: 2.79 MIL/uL — ABNORMAL LOW (ref 4.40–5.90)
RDW: 20.3 % — AB (ref 11.5–14.5)
WBC: 14.2 10*3/uL — AB (ref 3.8–10.6)

## 2015-04-05 LAB — LACTIC ACID, PLASMA: LACTIC ACID, VENOUS: 1.1 mmol/L (ref 0.5–2.0)

## 2015-04-05 NOTE — ED Notes (Signed)
Patient with complaint of cough and shortness of breath times one month. Patient was discharged from here last week for the same. Patient given duoneb and 125mg  solumedrol by ems.

## 2015-04-05 NOTE — ED Notes (Signed)
Patient transported to X-ray 

## 2015-04-05 NOTE — ED Provider Notes (Addendum)
[facility] Emergency Department Provider Note  ____________________________________________  Time seen: Approximately 10:49 PM  I have reviewed the triage vital signs and the nursing notes.   HISTORY  Chief Complaint Shortness of Breath and Cough    HPI Stanley [name] is a 55 y.o. male who reports he got short of breath tonight. He says the fluids building up in his belly. He says he has too much fluid. He does not have any fever he said. Patient has some wheezing. He also gets dialysis he was dialyzed yesterday and his next dialysis is due Tuesday. O2 sats on room air in the room now are 98%.   Past Medical History  Diagnosis Date  . Coronary artery disease   . Hypertension   . GERD (gastroesophageal reflux disease)   . Suicidal ideation     & HOMICIDAL IDEATION --  06-16-2013   ADMITTED TO BEHAVIOR HEALTH  . Alcohol abuse   . Drug abuse   . Diabetic peripheral neuropathy ([place])   . End stage renal disease on dialysis [facility]) NEPHROLOGIST-   DR [facility]  IN [place]    HEMODIALYSIS --   TUES/  THURS/  SAT  . Cirrhosis [facility])     Patient Active Problem List   Diagnosis Date Noted  . GIB (gastrointestinal bleeding) 03/23/2015  . Homicidal ideation 06/19/2013  . Suicidal intent 06/19/2013  . Homicidal ideations 06/19/2013  . Hyperkalemia 06/16/2013  . Mandible fracture ([place]) 06/05/2013  . Fracture, mandible ([place]) 06/02/2013  . Coronary atherosclerosis of native coronary artery 06/02/2013  . ESRD on dialysis ([place]) 06/02/2013  . Mandible open fracture ([place]) 06/02/2013    Past Surgical History  Procedure Laterality Date  . Dialysis fistula creation  LAST SURGERY  APPOX  2008  . Orif mandibular fracture N/A 06/05/2013    Procedure: REPAIR OF MANDIBULAR FRACTURE x 2 with maxillo-mandibular fixation ;  Surgeon: Theodoro Kos, DO;  Location: [place];  Service: Plastics;  Laterality: N/A;  . Coronary artery bypass graft  2008  ([place] , [place])    3  VESSEL  . Coronary angioplasty  ?    PT UNABLE TO TELL IF  BEFORE OR AFTER  CABG  . Mandibular hardware removal N/A 07/29/2013    Procedure: REMOVAL OF ARCH BARS;  Surgeon: Theodoro Kos, DO;  Location: [place];  Service: Plastics;  Laterality: N/A;    Current Outpatient Rx  Name  Route  Sig  Dispense  Refill  . amLODipine (NORVASC) 5 MG tablet   Oral   Take 5 mg by mouth daily.          Marland Kitchen atorvastatin (LIPITOR) 40 MG tablet   Oral   Take 40 mg by mouth daily.         . benazepril (LOTENSIN) 10 MG tablet   Oral   Take 10 mg by mouth daily.         . calcium acetate (PHOSLO) 667 MG capsule   Oral   Take 2,001 mg by mouth 3 (three) times daily with meals.         . cinacalcet (SENSIPAR) 30 MG tablet   Oral   Take 1 tablet by mouth daily.         Marland Kitchen gabapentin (NEURONTIN) 300 MG capsule   Oral   Take 1 capsule (300 mg total) by mouth daily.   30 capsule   0   . labetalol (NORMODYNE) 200 MG tablet   Oral   Take 200  mg by mouth daily.          Marland Kitchen levofloxacin (LEVAQUIN) 250 MG tablet   Oral   Take 1 tablet (250 mg total) by mouth every other day.   3 tablet   0   . Multiple Vitamins-Minerals (MULTIVITAMIN WITH MINERALS) tablet   Oral   Take 1 tablet by mouth daily.         . pantoprazole (PROTONIX) 40 MG tablet   Oral   Take 1 tablet (40 mg total) by mouth daily.   30 tablet   0   . predniSONE (DELTASONE) 10 MG tablet   Oral   Take 1 tablet (10 mg total) by mouth daily with breakfast. 40 mg PO (BY MOUTH)  x 2 days 30 mg PO x 2 days 20 mg PO x 2 days 10 mg PO x 2 days then stop   20 tablet   0     Label  & dispense according to the schedule below: ...     Allergies Review of patient's allergies indicates no known allergies.  Family History  Problem Relation Age of Onset  . Cancer Mother   . Cancer Father   . Cancer Sister     Social History Social History  Substance Use Topics  . Smoking status: Light Tobacco  Smoker -- 0.00 packs/day for 40 years    Types: Cigarettes  . Smokeless tobacco: Never Used  . Alcohol Use: No    Review of Systems Constitutional: No fever/chills Eyes: No visual changes. ENT: No sore throat. Cardiovascular: Denies chest pain. Respiratory: See history of present illness Gastrointestinal: No abdominal pain.  No nausea, no vomiting.  No diarrhea.  No constipation. Genitourinary: Negative for dysuria. Musculoskeletal: Negative for back pain. Skin: Negative for rash. Neurological: Negative for headaches, focal weakness or numbness.  10-point ROS otherwise negative.  ____________________________________________   PHYSICAL EXAM:  VITAL SIGNS: ED Triage Vitals  Enc Vitals Group     BP 04/05/15 2234 136/87 mmHg     Pulse Rate 04/05/15 2234 95     Resp 04/05/15 2234 22     Temp 04/05/15 2234 97.6 F (36.4 C)     Temp Source 04/05/15 2234 Oral     SpO2 04/05/15 2234 98 %     Weight 04/05/15 2234 180 lb (81.647 kg)     Height 04/05/15 2234 6\' 3"  (1.905 m)     Head Cir --      Peak Flow --      Pain Score 04/05/15 2235 9     Pain Loc --      Pain Edu? --      Excl. in [place]? --     Constitutional: Alert and oriented. Well appearing and in no acute distress. Eyes: Conjunctivae are normal. PERRL. EOMI. Head: Atraumatic. Nose: No congestion/rhinnorhea. Mouth/Throat: Mucous membranes are moist.  Oropharynx non-erythematous. Neck: No stridor.   Cardiovascular: Normal rate, regular rhythm. Grossly normal heart sounds.  Good peripheral circulation. Respiratory: Normal respiratory effort.  No retractions. Lungs CTAB. Gastrointestinal: Soft and nontender. No distention. No abdominal bruits. No CVA tenderness. Musculoskeletal: No lower extremity tenderness some edema is present.  No joint effusions. Neurologic:  Normal speech and language. No gross focal neurologic deficits are appreciated. No gait instability. Skin:  Skin is warm, dry and intact. No rash  noted. Psychiatric: Mood and affect are normal. Speech and behavior are normal.  ____________________________________________   LABS (all labs ordered are listed, but only abnormal results are displayed)  Labs Reviewed  BASIC METABOLIC PANEL - Abnormal; Notable for the following:    Glucose, Bld 112 (*)    BUN 49 (*)    Creatinine, Ser 7.10 (*)    GFR calc non Af Amer 8 (*)    GFR calc Af Amer 9 (*)    All other components within normal limits  CBC - Abnormal; Notable for the following:    WBC 14.2 (*)    RBC 2.79 (*)    Hemoglobin 8.0 (*)    HCT 26.4 (*)    MCHC 30.4 (*)    RDW 20.3 (*)    All other components within normal limits  TROPONIN I - Abnormal; Notable for the following:    Troponin I 0.05 (*)    All other components within normal limits  LACTIC ACID, PLASMA  COMPREHENSIVE METABOLIC PANEL  BRAIN NATRIURETIC PEPTIDE  TROPONIN I  LACTIC ACID, PLASMA  CBC WITH DIFFERENTIAL/PLATELET  TROPONIN I   ____________________________________________  EKG  EKG read and interpreted by me shows normal sinus rhythm at 93 normal axis essentially unchanged from one earlier this month. ____________________________________________  RADIOLOGY  Radiologist reads the chest x-ray is vascular congestion but lungs grossly clear. ____________________________________________   PROCEDURES  Patient's O2 sats remains constant at 98    ____________________________________________   INITIAL IMPRESSION / ASSESSMENT AND PLAN / ED COURSE  Pertinent labs & imaging results that were available during my care of the patient were reviewed by me and considered in my medical decision making (see chart for details).  Patient's troponin is up at 0.05 however this is lower than his previous 3 troponins which were 0.07 ____________________________________________   FINAL CLINICAL IMPRESSION(S) / ED DIAGNOSES  Final diagnoses:  Dyspnea      Nena Polio, MD 04/06/15 0012 \ I  talked to the patient and explained to him that his labs are still too good I cannot put him in the hospital specialist that was O2 sats of 96-98. Heart rate and blood pressure good we will check one more troponin and Dr. Nancy Fetter will check that and he is written up to go outside as negative.  Nena Polio, MD 04/06/15 [phone]

## 2015-04-05 NOTE — ED Notes (Signed)
Dr Cinda Quest notified of elevated troponin 0.05

## 2015-04-06 ENCOUNTER — Encounter: Payer: Self-pay | Admitting: Internal Medicine

## 2015-04-06 ENCOUNTER — Inpatient Hospital Stay: Payer: Medicare Other

## 2015-04-06 DIAGNOSIS — Z809 Family history of malignant neoplasm, unspecified: Secondary | ICD-10-CM | POA: Diagnosis not present

## 2015-04-06 DIAGNOSIS — R140 Abdominal distension (gaseous): Secondary | ICD-10-CM | POA: Diagnosis present

## 2015-04-06 DIAGNOSIS — D631 Anemia in chronic kidney disease: Secondary | ICD-10-CM | POA: Diagnosis not present

## 2015-04-06 DIAGNOSIS — E1142 Type 2 diabetes mellitus with diabetic polyneuropathy: Secondary | ICD-10-CM | POA: Diagnosis present

## 2015-04-06 DIAGNOSIS — I10 Essential (primary) hypertension: Secondary | ICD-10-CM | POA: Diagnosis not present

## 2015-04-06 DIAGNOSIS — I120 Hypertensive chronic kidney disease with stage 5 chronic kidney disease or end stage renal disease: Secondary | ICD-10-CM | POA: Diagnosis present

## 2015-04-06 DIAGNOSIS — E785 Hyperlipidemia, unspecified: Secondary | ICD-10-CM | POA: Diagnosis present

## 2015-04-06 DIAGNOSIS — E1122 Type 2 diabetes mellitus with diabetic chronic kidney disease: Secondary | ICD-10-CM | POA: Diagnosis present

## 2015-04-06 DIAGNOSIS — N2581 Secondary hyperparathyroidism of renal origin: Secondary | ICD-10-CM | POA: Diagnosis present

## 2015-04-06 DIAGNOSIS — Z79899 Other long term (current) drug therapy: Secondary | ICD-10-CM | POA: Diagnosis not present

## 2015-04-06 DIAGNOSIS — R0600 Dyspnea, unspecified: Secondary | ICD-10-CM | POA: Diagnosis present

## 2015-04-06 DIAGNOSIS — R0602 Shortness of breath: Secondary | ICD-10-CM | POA: Diagnosis not present

## 2015-04-06 DIAGNOSIS — Z951 Presence of aortocoronary bypass graft: Secondary | ICD-10-CM | POA: Diagnosis not present

## 2015-04-06 DIAGNOSIS — Z992 Dependence on renal dialysis: Secondary | ICD-10-CM | POA: Diagnosis not present

## 2015-04-06 DIAGNOSIS — R188 Other ascites: Secondary | ICD-10-CM | POA: Diagnosis present

## 2015-04-06 DIAGNOSIS — Z9889 Other specified postprocedural states: Secondary | ICD-10-CM | POA: Diagnosis not present

## 2015-04-06 DIAGNOSIS — K219 Gastro-esophageal reflux disease without esophagitis: Secondary | ICD-10-CM | POA: Diagnosis present

## 2015-04-06 DIAGNOSIS — K7460 Unspecified cirrhosis of liver: Secondary | ICD-10-CM | POA: Diagnosis present

## 2015-04-06 DIAGNOSIS — Z720 Tobacco use: Secondary | ICD-10-CM | POA: Diagnosis not present

## 2015-04-06 DIAGNOSIS — Z716 Tobacco abuse counseling: Secondary | ICD-10-CM | POA: Diagnosis not present

## 2015-04-06 DIAGNOSIS — I2510 Atherosclerotic heart disease of native coronary artery without angina pectoris: Secondary | ICD-10-CM | POA: Diagnosis present

## 2015-04-06 DIAGNOSIS — F17200 Nicotine dependence, unspecified, uncomplicated: Secondary | ICD-10-CM | POA: Diagnosis not present

## 2015-04-06 DIAGNOSIS — F17210 Nicotine dependence, cigarettes, uncomplicated: Secondary | ICD-10-CM | POA: Diagnosis present

## 2015-04-06 DIAGNOSIS — J441 Chronic obstructive pulmonary disease with (acute) exacerbation: Secondary | ICD-10-CM | POA: Diagnosis present

## 2015-04-06 DIAGNOSIS — N186 End stage renal disease: Secondary | ICD-10-CM | POA: Diagnosis not present

## 2015-04-06 DIAGNOSIS — K7031 Alcoholic cirrhosis of liver with ascites: Secondary | ICD-10-CM | POA: Diagnosis present

## 2015-04-06 DIAGNOSIS — E875 Hyperkalemia: Secondary | ICD-10-CM | POA: Diagnosis present

## 2015-04-06 LAB — HEPATIC FUNCTION PANEL
ALBUMIN: 3.1 g/dL — AB (ref 3.5–5.0)
ALT: 51 U/L (ref 17–63)
AST: 37 U/L (ref 15–41)
Alkaline Phosphatase: 141 U/L — ABNORMAL HIGH (ref 38–126)
BILIRUBIN INDIRECT: 0.6 mg/dL (ref 0.3–0.9)
BILIRUBIN TOTAL: 0.8 mg/dL (ref 0.3–1.2)
Bilirubin, Direct: 0.2 mg/dL (ref 0.1–0.5)
TOTAL PROTEIN: 6.6 g/dL (ref 6.5–8.1)

## 2015-04-06 LAB — DIFFERENTIAL
BASOS PCT: 0 %
BLASTS: 0 %
Band Neutrophils: 1 %
Basophils Absolute: 0 10*3/uL (ref 0–0.1)
EOS ABS: 0.1 10*3/uL (ref 0–0.7)
EOS PCT: 1 %
Lymphocytes Relative: 12 %
Lymphs Abs: 1.7 10*3/uL (ref 1.0–3.6)
METAMYELOCYTES PCT: 1 %
MYELOCYTES: 1 %
Monocytes Absolute: 0.3 10*3/uL (ref 0.2–1.0)
Monocytes Relative: 2 %
NEUTROS ABS: 12.1 10*3/uL — AB (ref 1.4–6.5)
NEUTROS PCT: 82 %
Other: 0 %
Promyelocytes Absolute: 0 %
nRBC: 0 /100 WBC

## 2015-04-06 LAB — BASIC METABOLIC PANEL
ANION GAP: 10 (ref 5–15)
BUN: 57 mg/dL — ABNORMAL HIGH (ref 6–20)
CALCIUM: 9.5 mg/dL (ref 8.9–10.3)
CO2: 24 mmol/L (ref 22–32)
Chloride: 106 mmol/L (ref 101–111)
Creatinine, Ser: 7.35 mg/dL — ABNORMAL HIGH (ref 0.61–1.24)
GFR, EST AFRICAN AMERICAN: 9 mL/min — AB (ref >60)
GFR, EST NON AFRICAN AMERICAN: 7 mL/min — AB (ref >60)
Glucose, Bld: 124 mg/dL — ABNORMAL HIGH (ref 65–99)
Potassium: 6.3 mmol/L — ABNORMAL HIGH (ref 3.5–5.1)
SODIUM: 140 mmol/L (ref 135–145)

## 2015-04-06 LAB — CBC
HCT: 26 % — ABNORMAL LOW (ref 40.0–52.0)
HCT: 25.5 % — ABNORMAL LOW (ref 40.0–52.0)
Hemoglobin: 7.9 g/dL — ABNORMAL LOW (ref 13.0–18.0)
Hemoglobin: 7.6 g/dL — ABNORMAL LOW (ref 13.0–18.0)
MCH: 28.9 pg (ref 26.0–34.0)
MCH: 27.8 pg (ref 26.0–34.0)
MCHC: 30.3 g/dL — ABNORMAL LOW (ref 32.0–36.0)
MCHC: 29.8 g/dL — AB (ref 32.0–36.0)
MCV: 95.2 fL (ref 80.0–100.0)
MCV: 93.1 fL (ref 80.0–100.0)
PLATELETS: 247 10*3/uL (ref 150–440)
Platelets: 258 10*3/uL (ref 150–440)
RBC: 2.73 MIL/uL — ABNORMAL LOW (ref 4.40–5.90)
RBC: 2.74 MIL/uL — ABNORMAL LOW (ref 4.40–5.90)
RDW: 20.1 % — AB (ref 11.5–14.5)
RDW: 20.7 % — AB (ref 11.5–14.5)
WBC: 11.2 10*3/uL — ABNORMAL HIGH (ref 3.8–10.6)
WBC: 12.9 10*3/uL — AB (ref 3.8–10.6)

## 2015-04-06 LAB — RENAL FUNCTION PANEL
ALBUMIN: 3.1 g/dL — AB (ref 3.5–5.0)
Anion gap: 12 (ref 5–15)
BUN: 63 mg/dL — AB (ref 6–20)
CALCIUM: 9.3 mg/dL (ref 8.9–10.3)
CO2: 22 mmol/L (ref 22–32)
CREATININE: 7.35 mg/dL — AB (ref 0.61–1.24)
Chloride: 104 mmol/L (ref 101–111)
GFR calc Af Amer: 9 mL/min — ABNORMAL LOW (ref >60)
GFR, EST NON AFRICAN AMERICAN: 7 mL/min — AB (ref >60)
Glucose, Bld: 144 mg/dL — ABNORMAL HIGH (ref 65–99)
PHOSPHORUS: 4.4 mg/dL (ref 2.5–4.6)
POTASSIUM: 6 mmol/L — AB (ref 3.5–5.1)
SODIUM: 138 mmol/L (ref 135–145)

## 2015-04-06 LAB — BRAIN NATRIURETIC PEPTIDE: B Natriuretic Peptide: 2784 pg/mL — ABNORMAL HIGH (ref 0.0–100.0)

## 2015-04-06 LAB — LACTIC ACID, PLASMA: LACTIC ACID, VENOUS: 1.3 mmol/L (ref 0.5–2.0)

## 2015-04-06 LAB — TROPONIN I: TROPONIN I: 0.06 ng/mL — AB (ref <0.031)

## 2015-04-06 MED ORDER — PANTOPRAZOLE SODIUM 40 MG PO TBEC
40.0000 mg | DELAYED_RELEASE_TABLET | Freq: Every day | ORAL | Status: DC
  Administered 2015-04-06 – 2015-04-07 (×2): 40 mg via ORAL
  Filled 2015-04-06 (×2): qty 1

## 2015-04-06 MED ORDER — CINACALCET HCL 30 MG PO TABS
30.0000 mg | ORAL_TABLET | Freq: Every day | ORAL | Status: DC
  Administered 2015-04-07: 30 mg via ORAL
  Filled 2015-04-06 (×3): qty 1

## 2015-04-06 MED ORDER — ATORVASTATIN CALCIUM 20 MG PO TABS
40.0000 mg | ORAL_TABLET | Freq: Every day | ORAL | Status: DC
  Administered 2015-04-06 – 2015-04-07 (×2): 40 mg via ORAL
  Filled 2015-04-06 (×2): qty 2

## 2015-04-06 MED ORDER — CALCIUM ACETATE (PHOS BINDER) 667 MG PO CAPS
2001.0000 mg | ORAL_CAPSULE | Freq: Three times a day (TID) | ORAL | Status: DC
  Administered 2015-04-07 (×2): 2001 mg via ORAL
  Filled 2015-04-06 (×3): qty 3

## 2015-04-06 MED ORDER — MORPHINE SULFATE (PF) 2 MG/ML IV SOLN
1.0000 mg | Freq: Once | INTRAVENOUS | Status: AC
  Administered 2015-04-06: 1 mg via INTRAVENOUS
  Filled 2015-04-06: qty 1

## 2015-04-06 MED ORDER — SODIUM CHLORIDE 0.9 % IJ SOLN
3.0000 mL | Freq: Two times a day (BID) | INTRAMUSCULAR | Status: DC
  Administered 2015-04-06 – 2015-04-07 (×3): 3 mL via INTRAVENOUS

## 2015-04-06 MED ORDER — TRAMADOL HCL 50 MG PO TABS
50.0000 mg | ORAL_TABLET | Freq: Two times a day (BID) | ORAL | Status: DC | PRN
  Administered 2015-04-06 – 2015-04-07 (×2): 50 mg via ORAL
  Filled 2015-04-06 (×2): qty 1

## 2015-04-06 MED ORDER — FUROSEMIDE 40 MG PO TABS
40.0000 mg | ORAL_TABLET | Freq: Every day | ORAL | Status: DC
  Administered 2015-04-07: 40 mg via ORAL
  Filled 2015-04-06: qty 1

## 2015-04-06 MED ORDER — EPOETIN ALFA 10000 UNIT/ML IJ SOLN
10000.0000 [IU] | Freq: Once | INTRAMUSCULAR | Status: AC
  Administered 2015-04-06: 10000 [IU] via INTRAVENOUS

## 2015-04-06 MED ORDER — SPIRONOLACTONE 25 MG PO TABS: 50.0000 mg | ORAL_TABLET | Freq: Every day | ORAL | Status: DC

## 2015-04-06 MED ORDER — IPRATROPIUM-ALBUTEROL 0.5-2.5 (3) MG/3ML IN SOLN: 3.0000 mL | RESPIRATORY_TRACT | Status: DC | PRN

## 2015-04-06 MED ORDER — AMLODIPINE BESYLATE 5 MG PO TABS
5.0000 mg | ORAL_TABLET | Freq: Every day | ORAL | Status: DC
  Administered 2015-04-06 – 2015-04-07 (×2): 5 mg via ORAL
  Filled 2015-04-06 (×2): qty 1

## 2015-04-06 MED ORDER — ONDANSETRON HCL 4 MG PO TABS: 4.0000 mg | ORAL_TABLET | Freq: Four times a day (QID) | ORAL | Status: DC | PRN

## 2015-04-06 MED ORDER — IPRATROPIUM-ALBUTEROL 0.5-2.5 (3) MG/3ML IN SOLN
3.0000 mL | Freq: Once | RESPIRATORY_TRACT | Status: AC
  Administered 2015-04-06: 3 mL via RESPIRATORY_TRACT
  Filled 2015-04-06: qty 3

## 2015-04-06 MED ORDER — LABETALOL HCL 100 MG PO TABS
200.0000 mg | ORAL_TABLET | Freq: Every day | ORAL | Status: DC
  Administered 2015-04-06 – 2015-04-07 (×2): 200 mg via ORAL
  Filled 2015-04-06 (×2): qty 2

## 2015-04-06 MED ORDER — AZITHROMYCIN 250 MG PO TABS
500.0000 mg | ORAL_TABLET | Freq: Every day | ORAL | Status: DC
  Administered 2015-04-06 – 2015-04-07 (×2): 500 mg via ORAL
  Filled 2015-04-06 (×2): qty 2

## 2015-04-06 MED ORDER — HEPARIN SODIUM (PORCINE) 5000 UNIT/ML IJ SOLN
5000.0000 [IU] | Freq: Three times a day (TID) | INTRAMUSCULAR | Status: DC
  Administered 2015-04-06 – 2015-04-07 (×4): 5000 [IU] via SUBCUTANEOUS
  Filled 2015-04-06 (×4): qty 1

## 2015-04-06 MED ORDER — OXYCODONE-ACETAMINOPHEN 5-325 MG PO TABS
1.0000 | ORAL_TABLET | Freq: Four times a day (QID) | ORAL | Status: DC | PRN
  Administered 2015-04-06 – 2015-04-07 (×5): 1 via ORAL
  Filled 2015-04-06 (×5): qty 1

## 2015-04-06 MED ORDER — BENAZEPRIL HCL 10 MG PO TABS
10.0000 mg | ORAL_TABLET | Freq: Every day | ORAL | Status: DC
  Administered 2015-04-06 – 2015-04-07 (×2): 10 mg via ORAL
  Filled 2015-04-06 (×2): qty 1

## 2015-04-06 MED ORDER — ONDANSETRON HCL 4 MG/2ML IJ SOLN: 4.0000 mg | Freq: Four times a day (QID) | INTRAMUSCULAR | Status: DC | PRN

## 2015-04-06 NOTE — Progress Notes (Signed)
Patient returned from procedure. Asked for pain meds and food.

## 2015-04-06 NOTE — H&P (Signed)
[place] [facility] [place] NAME: Stanley [name]    MR#:  [id number]  DATE OF BIRTH:  February 19, 1961  DATE OF ADMISSION:  04/05/2015  PRIMARY CARE PHYSICIAN: Elyse Jarvis, MD   REQUESTING/REFERRING PHYSICIAN: Beather Arbour, MD  CHIEF COMPLAINT:   Chief Complaint  Patient presents with  . Shortness of Breath  . Cough    HISTORY OF PRESENT ILLNESS:  Stanley [name]  is a 55 y.o. male who presents with cough and progressive dyspnea. Patient has history of end-stage renal disease on hemodialysis, as well as alcoholic liver cirrhosis with ascites. He states that several months ago he began having episodes of shortness of breath. He states that he will become progressively more short of breath, and then reaches a point where he has developed orthopnea and then begins wheezing. He has been in the hospital a few times in the last several months and has had these episodes while here as well. There is some suspicion for a diagnosis of COPD based on his smoking history, though this has not been formally diagnosed. Patient states that his presentation was similar this time around. He began getting gradually more short of breath from few days prior to presentation to the ED. The day prior to admission he became acutely more short of breath, most specifically in the evening short while after laying down in bed. Here in the ED he was found to have mildly elevated white count, chest x-ray showing some vascular congestion. His breathing improved somewhat with nebulizer treatments. Hospitalists were called for admission.  PAST MEDICAL HISTORY:   Past Medical History  Diagnosis Date  . Coronary artery disease   . Hypertension   . GERD (gastroesophageal reflux disease)   . Suicidal ideation     & HOMICIDAL IDEATION --  06-16-2013   ADMITTED TO BEHAVIOR HEALTH  . Alcohol abuse   . Drug abuse   . Diabetic peripheral neuropathy ([place])   . End stage renal disease on dialysis  [facility]) NEPHROLOGIST-   DR [facility]  IN [place]    HEMODIALYSIS --   TUES/  THURS/  SAT  . Cirrhosis ([place])     PAST SURGICAL HISTORY:   Past Surgical History  Procedure Laterality Date  . Dialysis fistula creation  LAST SURGERY  APPOX  2008  . Orif mandibular fracture N/A 06/05/2013    Procedure: REPAIR OF MANDIBULAR FRACTURE x 2 with maxillo-mandibular fixation ;  Surgeon: Theodoro Kos, DO;  Location: [place];  Service: Plastics;  Laterality: N/A;  . Coronary artery bypass graft  2008  ([place] , [place])    3 VESSEL  . Coronary angioplasty  ?    PT UNABLE TO TELL IF  BEFORE OR AFTER  CABG  . Mandibular hardware removal N/A 07/29/2013    Procedure: REMOVAL OF ARCH BARS;  Surgeon: Theodoro Kos, DO;  Location: [place];  Service: Plastics;  Laterality: N/A;    SOCIAL HISTORY:   Social History  Substance Use Topics  . Smoking status: Light Tobacco Smoker -- 0.00 packs/day for 40 years    Types: Cigarettes  . Smokeless tobacco: Never Used  . Alcohol Use: No    FAMILY HISTORY:   Family History  Problem Relation Age of Onset  . Cancer Mother   . Cancer Father   . Cancer Sister     DRUG ALLERGIES:  No Known Allergies  MEDICATIONS AT HOME:   Prior to Admission medications   Medication Sig Start Date  End Date Taking? Authorizing Provider  amLODipine (NORVASC) 5 MG tablet Take 5 mg by mouth daily.     Historical Provider, MD  atorvastatin (LIPITOR) 40 MG tablet Take 40 mg by mouth daily.    Historical Provider, MD  benazepril (LOTENSIN) 10 MG tablet Take 10 mg by mouth daily.    Historical Provider, MD  calcium acetate (PHOSLO) 667 MG capsule Take 2,001 mg by mouth 3 (three) times daily with meals.    Historical Provider, MD  cinacalcet (SENSIPAR) 30 MG tablet Take 1 tablet by mouth daily.    Historical Provider, MD  gabapentin (NEURONTIN) 300 MG capsule Take 1 capsule (300 mg total) by mouth daily. 03/27/15   Bettey Costa, MD  labetalol (NORMODYNE) 200 MG tablet  Take 200 mg by mouth daily.     Historical Provider, MD  levofloxacin (LEVAQUIN) 250 MG tablet Take 1 tablet (250 mg total) by mouth every other day. 03/27/15   Bettey Costa, MD  Multiple Vitamins-Minerals (MULTIVITAMIN WITH MINERALS) tablet Take 1 tablet by mouth daily.    Historical Provider, MD  pantoprazole (PROTONIX) 40 MG tablet Take 1 tablet (40 mg total) by mouth daily. 03/25/15   Bettey Costa, MD  predniSONE (DELTASONE) 10 MG tablet Take 1 tablet (10 mg total) by mouth daily with breakfast. 40 mg PO (BY MOUTH)  x 2 days 30 mg PO x 2 days 20 mg PO x 2 days 10 mg PO x 2 days then stop 03/27/15   Bettey Costa, MD    REVIEW OF SYSTEMS:  Review of Systems  Constitutional: Negative for fever, chills, weight loss and malaise/fatigue.  HENT: Negative for ear pain, hearing loss and tinnitus.   Eyes: Negative for blurred vision, double vision, pain and redness.  Respiratory: Positive for cough, shortness of breath and wheezing. Negative for hemoptysis.   Cardiovascular: Positive for orthopnea. Negative for chest pain, palpitations and leg swelling.  Gastrointestinal: Positive for abdominal pain. Negative for nausea, vomiting, diarrhea and constipation.  Genitourinary: Negative for dysuria, frequency and hematuria.  Musculoskeletal: Negative for back pain, joint pain and neck pain.  Skin:       No acne, rash, or lesions  Neurological: Negative for dizziness, tremors, focal weakness and weakness.  Endo/Heme/Allergies: Negative for polydipsia. Does not bruise/bleed easily.  Psychiatric/Behavioral: Negative for depression. The patient is not nervous/anxious and does not have insomnia.      VITAL SIGNS:   Filed Vitals:   04/05/15 2300 04/06/15 0111 04/06/15 0130 04/06/15 0200  BP: 143/82 119/84 137/84 142/90  Pulse: 91 84 96 93  Temp:      TempSrc:      Resp: 20 22 32 14  Height:      Weight:      SpO2: 97% 98% 96% 100%   Wt Readings from Last 3 Encounters:  04/05/15 81.647 kg (180 lb)   03/26/15 83 kg (182 lb 15.7 oz)  12/04/14 81.647 kg (180 lb)    PHYSICAL EXAMINATION:  Physical Exam  Vitals reviewed. Constitutional: He is oriented to person, place, and time. He appears well-developed and well-nourished. No distress.  HENT:  Head: Normocephalic and atraumatic.  Mouth/Throat: Oropharynx is clear and moist.  Eyes: Conjunctivae and EOM are normal. Pupils are equal, round, and reactive to light. No scleral icterus.  Neck: Normal range of motion. Neck supple. No JVD present. No thyromegaly present.  Cardiovascular: Normal rate, regular rhythm and intact distal pulses.  Exam reveals no gallop and no friction rub.   No murmur  heard. Respiratory: Effort normal. No respiratory distress. He has wheezes. He has rales.  GI: Soft. Bowel sounds are normal. He exhibits distension. There is no tenderness.  Musculoskeletal: Normal range of motion. He exhibits no edema.  No arthritis, no gout  Lymphadenopathy:    He has no cervical adenopathy.  Neurological: He is alert and oriented to person, place, and time. No cranial nerve deficit.  No dysarthria, no aphasia  Skin: Skin is warm and dry. No rash noted. No erythema.  Psychiatric: He has a normal mood and affect. His behavior is normal. Judgment and thought content normal.    LABORATORY PANEL:   CBC  Recent Labs Lab 04/05/15 2240  WBC 14.2*  HGB 8.0*  HCT 26.4*  PLT 279   ------------------------------------------------------------------------------------------------------------------  Chemistries   Recent Labs Lab 04/05/15 2240  NA 142  K 4.7  CL 104  CO2 27  GLUCOSE 112*  BUN 49*  CREATININE 7.10*  CALCIUM 9.6   ------------------------------------------------------------------------------------------------------------------  Cardiac Enzymes  Recent Labs Lab 04/06/15 0134  TROPONINI 0.06*    ------------------------------------------------------------------------------------------------------------------  RADIOLOGY:  Dg Chest 2 View  04/05/2015  CLINICAL DATA:  Acute onset of shortness of breath. Generalized abdominal distention. Initial encounter. EXAM: CHEST  2 VIEW COMPARISON:  Chest radiograph performed 03/26/2015 FINDINGS: The lungs are well-aerated. Vascular congestion is noted. There is no evidence of focal opacification, pleural effusion or pneumothorax. The heart is borderline enlarged. The patient is status post median sternotomy. No acute osseous abnormalities are seen. IMPRESSION: Vascular congestion and borderline cardiomegaly. Lungs remain grossly clear. Electronically Signed   By: Garald Balding M.D.   On: 04/05/2015 23:48    EKG:   Orders placed or performed during the hospital encounter of 04/05/15  . ED EKG  . ED EKG  . EKG 12-Lead  . EKG 12-Lead  . ED EKG  . ED EKG    IMPRESSION AND PLAN:  Principal Problem:   Dyspnea - suspect some combination of perhaps mild volume overload due to his cirrhosis and renal disease in conjunction perhaps with undiagnosed COPD, and the possibility of some undiagnosed congestive heart failure. Patient states that he was supposed to follow with a cardiologist some time ago, but had difficulty coordinating the appointments and then never followed up. His breathing has used quite a bit with nebulizer treatment, though he is still wheezing and has bibasilar crackles on exam. We will get an echocardiogram to evaluate for possible CHF, BNP is still pending. We'll keep duo nebs on board. We'll consult nephrology for his dialysis, and can possibly consider an early dialysis session to pull some fluid based on nephrology's recommendations, and the results of these tests to evaluate for CHF. Active Problems:   Coronary atherosclerosis of native coronary artery - continue home meds, echocardiogram as above. Might consider cardiology  consult pending results of his echocardiogram and BNP is initial evaluation for possible CHF.   ESRD on dialysis [facility]) - usually dialyzes Tuesday Thursday Saturday. Last dialysis was this past Saturday. Nephrology consult for hemodialysis support. Continue home meds. Avoid nephrotoxins.   HTN (hypertension) - currently at goal, continue home meds   Cirrhosis ([place]) - avoid hepatotoxins. Patient has ascites in his abdomen on exam. This may contribute some to his shortness of breath and feeling orthopnea. He was admitted here recently and had a paracentesis done at that time. His abdomen is distended but still soft. Do not feel an urgent need for repeat paracentesis right now.   GERD (gastroesophageal reflux disease) -  home dose PPI   HLD (hyperlipidemia) - continue home meds  All the records are reviewed and case discussed with ED provider. Management plans discussed with the patient and/or family.  DVT PROPHYLAXIS: SubQ heparin  GI PROPHYLAXIS: PPI  ADMISSION STATUS: Observation  CODE STATUS: Full Code Status History    Date Active Date Inactive Code Status Order ID Comments User Context   03/23/2015 11:07 AM 03/27/2015 12:46 PM Full Code [id number]  Bettey Costa, MD Inpatient   06/19/2013 11:32 AM 06/21/2013  5:49 PM Full Code [id number]  Cristal Ford, DO Inpatient   06/16/2013  5:26 PM 06/19/2013 11:32 AM Full Code [id number]  Margarita Mail, PA-C ED   06/05/2013  3:33 PM 06/06/2013  6:56 PM Full Code [id number]  Theodoro Kos, DO Inpatient   06/02/2013  9:01 AM 06/05/2013  3:33 PM Full Code [id number]  Velvet Bathe, MD Inpatient   06/02/2013  8:40 AM 06/02/2013  9:01 AM Full Code [id number]  Velvet Bathe, MD Inpatient      TOTAL TIME TAKING CARE OF THIS PATIENT: 40 minutes.    Leotta Weingarten Thrall 04/06/2015, 3:31 AM  [facility]  Office  [phone]  CC: Primary care physician; Elyse Jarvis, MD

## 2015-04-06 NOTE — Progress Notes (Addendum)
[place] [facility] [place] NAME: Stanley [name]    MR#:  [id number]  DATE OF BIRTH:  1960-04-02  SUBJECTIVE:   Patient here with increasing shortness of breath.  REVIEW OF SYSTEMS:    Review of Systems  Constitutional: Negative for fever, chills and malaise/fatigue.  HENT: Negative for sore throat.   Eyes: Negative for blurred vision.  Respiratory: Positive for shortness of breath. Negative for cough, hemoptysis and wheezing.   Cardiovascular: Negative for chest pain, palpitations and leg swelling.  Gastrointestinal: Positive for constipation. Negative for nausea, vomiting, diarrhea, blood in stool and melena.  Genitourinary: Negative for dysuria.       Does not make much urine  Musculoskeletal: Negative for back pain.  Neurological: Negative for dizziness, tremors and headaches.  Endo/Heme/Allergies: Does not bruise/bleed easily.  Psychiatric/Behavioral: Negative for suicidal ideas.    Tolerating Diet:yes      DRUG ALLERGIES:  No Known Allergies  VITALS:  Blood pressure 139/76, pulse 94, temperature 97.8 F (36.6 C), temperature source Oral, resp. rate 18, height 6\' 3"  (1.905 m), weight 81.647 kg (180 lb), SpO2 98 %.  PHYSICAL EXAMINATION:   Physical Exam  Constitutional: He is oriented to person, place, and time and well-developed, well-nourished, and in no distress. No distress.  HENT:  Head: Normocephalic.  Eyes: No scleral icterus.  Neck: Normal range of motion. Neck supple. No JVD present. No tracheal deviation present.  Cardiovascular: Normal rate, regular rhythm and normal heart sounds.  Exam reveals no gallop and no friction rub.   No murmur heard. Pulmonary/Chest: Effort normal and breath sounds normal. No respiratory distress. He has no wheezes. He has no rales. He exhibits no tenderness.  Crackles at bases  Abdominal: Soft. He exhibits distension. He exhibits no mass. There is no tenderness. There is no  rebound and no guarding.  Musculoskeletal: Normal range of motion. He exhibits no edema.  Neurological: He is alert and oriented to person, place, and time.  Skin: Skin is warm. No rash noted. No erythema.  Psychiatric: Affect and judgment normal.      LABORATORY PANEL:   CBC  Recent Labs Lab 04/06/15 0556  WBC 11.2*  HGB 7.9*  HCT 26.0*  PLT 258   ------------------------------------------------------------------------------------------------------------------  Chemistries   Recent Labs Lab 04/05/15 2240 04/06/15 0556  NA 142 140  K 4.7 6.3*  CL 104 106  CO2 27 24  GLUCOSE 112* 124*  BUN 49* 57*  CREATININE 7.10* 7.35*  CALCIUM 9.6 9.5  AST 37  --   ALT 51  --   ALKPHOS 141*  --   BILITOT 0.8  --    ------------------------------------------------------------------------------------------------------------------  Cardiac Enzymes  Recent Labs Lab 04/05/15 2240 04/06/15 0134  TROPONINI 0.05* 0.06*   ------------------------------------------------------------------------------------------------------------------  RADIOLOGY:  Dg Chest 2 View  04/05/2015  CLINICAL DATA:  Acute onset of shortness of breath. Generalized abdominal distention. Initial encounter. EXAM: CHEST  2 VIEW COMPARISON:  Chest radiograph performed 03/26/2015 FINDINGS: The lungs are well-aerated. Vascular congestion is noted. There is no evidence of focal opacification, pleural effusion or pneumothorax. The heart is borderline enlarged. The patient is status post median sternotomy. No acute osseous abnormalities are seen. IMPRESSION: Vascular congestion and borderline cardiomegaly. Lungs remain grossly clear. Electronically Signed   By: Garald Balding M.D.   On: 04/05/2015 23:48     ASSESSMENT AND PLAN:   55 year old male with end-stage renal disease on hemodialysis and liver cirrhosis due to EtOH who presents  with shortness of breath.   1.dyspnea/shortness of breath: I suspect this is  related to his underlying renal and liver disease. 2-D echocardiogram is pending to evaluate for cardiac etiology. Patient reports that he may have undiagnosed COPD as well. BNP is elevated but this could be due to prerenal cause as well. Patient will undergo hemodialysis today. He will need a formal evaluation for COPD. He can have outpatient follow-up with pulmonary discharge. It is also noted that he has anemia. He may benefit from 1 unit of PRBCs. I will leave that to the discretion of the nephrologists.  2. [name]: Patient will receive dialysis today. His normal dialysis days are Tuesday, Thursday and Saturday.  3. Liver cirrhosis due to EtOH: Patient has a soft abdomen however distended. He had a paracentesis several weeks ago. I will repeat paracentesis today. He had approximate 5 L during his last paracentesis. Continue to avoid hepatotoxins. I will add Aldactone to regimen, no Lasix as patient does not make urine. Potassium needs to be monitored on this medication.  4. Essential hypertension: Continue Benzapril and labetalol.  5. Tobacco dependence: Patient says he smokes once in while. He is encouraged to completely stop smoking. Patient was counseled for 3 minutes. He reports he does not need a nicotine patch.  6. Anemia of chronic disease: It appears the patient has a baseline hemoglobin of approximately 8. He is at his baseline. Continue to monitor. Neupogen and blood transfusion as per nephrology.  Management plans discussed with the patient and he is in agreement.  CODE STATUS: FULL  TOTAL TIME TAKING CARE OF THIS PATIENT: 30 minutes.     POSSIBLE D/C 1-2 days, DEPENDING ON CLINICAL CONDITION.   Anjalina Bergevin M.D on 04/06/2015 at 9:55 AM  Between 7am to 6pm - Pager - [phone] After 6pm go to [email or web address] - password [email or web address] [facility]  Office  [phone]  CC: Primary care physician; Elyse Jarvis, MD  Note:  This dictation was prepared with Dragon dictation along with smaller phrase technology. Any transcriptional errors that result from this process are unintentional.

## 2015-04-06 NOTE — Progress Notes (Signed)
Post hd tx 

## 2015-04-06 NOTE — Progress Notes (Signed)
Pre-hd tx 

## 2015-04-06 NOTE — Progress Notes (Signed)
Pt. C/o left foot pain. Dr. Estanislado Pandy ordered percocet. 5/325 1 tablet q6h prn

## 2015-04-06 NOTE — ED Provider Notes (Signed)
-----------------------------------------   3:06 AM on 04/06/2015 -----------------------------------------  Repeat troponin 0.06. Patient denies chest pain. Patient received 125 mg IV Solu-Medrol per EMS and after 2 DuoNeb treatments he continues to have increased work of breathing with scattered wheezing. Patient ambulated with a pulse oximeter; room air sats decreased to 90%. Upon return to his bed, patient was tachypneic with increased wheezing. We will administer third DuoNeb and speak with hospitalist for evaluation in ED for admission.  Paulette Blanch, MD 04/06/15 [phone]

## 2015-04-06 NOTE — Progress Notes (Signed)
[facility]  ROUNDING NOTE   Subjective:   Admitted for shortness of breath. Patient was lifting and moving furniture.  Last hemodialysis Saturday.  Potassium of 6.3. Ate 6 bananas before admission.   Objective:  Vital signs in last 24 hours:  Temp:  [97.6 F (36.4 C)-98.2 F (36.8 C)] 97.7 F (36.5 C) (01/23 1055) Pulse Rate:  [76-96] 82 (01/23 1200) Resp:  [13-32] 13 (01/23 1200) BP: (119-143)/(73-90) 125/84 mmHg (01/23 1200) SpO2:  [96 %-100 %] 97 % (01/23 1200) Weight:  [81.647 kg (180 lb)-86.5 kg (190 lb 11.2 oz)] 86.5 kg (190 lb 11.2 oz) (01/23 1055)  Weight change:  Filed Weights   04/05/15 2234 04/06/15 1055  Weight: 81.647 kg (180 lb) 86.5 kg (190 lb 11.2 oz)    Intake/Output:     Intake/Output this shift:  Total I/O In: 240 [P.O.:240] Out: -   Physical Exam: General: NAD, laying in bed.   Head: Normocephalic, atraumatic. Moist oral mucosal membranes  Eyes: Anicteric, PERRL  Neck: Supple, trachea midline  Lungs:  Clear to auscultation  Heart: Regular rate and rhythm  Abdomen:  +distended  Extremities: no peripheral edema.  Neurologic: Nonfocal, moving all four extremities  Skin: No lesions  Access: Right arm AVF     Basic Metabolic Panel:  Recent Labs Lab 04/05/15 2240 04/06/15 0556 04/06/15 1129  NA 142 140 138  K 4.7 6.3* 6.0*  CL 104 106 104  CO2 27 24 22   GLUCOSE 112* 124* 144*  BUN 49* 57* 63*  CREATININE 7.10* 7.35* 7.35*  CALCIUM 9.6 9.5 9.3  PHOS  --   --  4.4    Liver Function Tests:  Recent Labs Lab 04/05/15 2240 04/06/15 1129  AST 37  --   ALT 51  --   ALKPHOS 141*  --   BILITOT 0.8  --   PROT 6.6  --   ALBUMIN 3.1* 3.1*   No results for input(s): LIPASE, AMYLASE in the last 168 hours. No results for input(s): AMMONIA in the last 168 hours.  CBC:  Recent Labs Lab 04/05/15 2240 04/06/15 0556 04/06/15 1129  WBC 14.2* 11.2* 12.9*  NEUTROABS 12.1*  --   --   HGB 8.0* 7.9* 7.6*  HCT 26.4* 26.0*  25.5*  MCV 94.9 95.2 93.1  PLT 279 258 247    Cardiac Enzymes:  Recent Labs Lab 04/05/15 2240 04/06/15 0134  TROPONINI 0.05* 0.06*    BNP: Invalid input(s): POCBNP  CBG: No results for input(s): GLUCAP in the last 168 hours.  Microbiology: Results for orders placed or performed during the hospital encounter of 03/23/15  Culture, group A strep     Status: None   Collection Time: 03/25/15  5:20 PM  Result Value Ref Range Status   Specimen Description THROAT  Final   Special Requests NONE  Final   Culture NO BETA HEMOLYTIC STREPTOCOCCI ISOLATED  Final   Report Status 03/27/2015 FINAL  Final    Coagulation Studies: No results for input(s): LABPROT, INR in the last 72 hours.  Urinalysis: No results for input(s): COLORURINE, LABSPEC, PHURINE, GLUCOSEU, HGBUR, BILIRUBINUR, KETONESUR, PROTEINUR, UROBILINOGEN, NITRITE, LEUKOCYTESUR in the last 72 hours.  Invalid input(s): APPERANCEUR    Imaging: Dg Chest 2 View  04/05/2015  CLINICAL DATA:  Acute onset of shortness of breath. Generalized abdominal distention. Initial encounter. EXAM: CHEST  2 VIEW COMPARISON:  Chest radiograph performed 03/26/2015 FINDINGS: The lungs are well-aerated. Vascular congestion is noted. There is no evidence of focal opacification,  pleural effusion or pneumothorax. The heart is borderline enlarged. The patient is status post median sternotomy. No acute osseous abnormalities are seen. IMPRESSION: Vascular congestion and borderline cardiomegaly. Lungs remain grossly clear. Electronically Signed   By: Garald Balding M.D.   On: 04/05/2015 23:48     Medications:     . amLODipine  5 mg Oral Daily  . atorvastatin  40 mg Oral Daily  . azithromycin  500 mg Oral Daily  . benazepril  10 mg Oral Daily  . cinacalcet  30 mg Oral Q breakfast  . heparin  5,000 Units Subcutaneous 3 times per day  . labetalol  200 mg Oral Daily  . pantoprazole  40 mg Oral QAC breakfast  . sodium chloride  3 mL Intravenous Q12H   . spironolactone  50 mg Oral Daily   ipratropium-albuterol, ondansetron **OR** ondansetron (ZOFRAN) IV, oxyCODONE-acetaminophen  Assessment/ Plan:  [name] is a 55 y.o. black male with end-stage renal disease, dialysis at [facility], coronary disease, hypertension, history of alcohol abuse, history of drug abuse  [facility] TTS  1. End-stage renal disease: emergent treatment today due to hyperkalemia. Tolerating treatment well. 2K bath.  - Low potassium diet education provided - Plan on hemodialysis tomorrow. And continue TTS schedule.  - discontinue spironolactone. Patient has hyperkalemia and not a candidate for this agent. He was previously on furosemide 40mg  daily, will restart.  2. Ascites from liver cirrhosis: status post paracentesis on 03/24/15. Another ultrasound guided paracentesis scheduled for today.   3. Secondary hyperparathyroidism: outpatient PTH 181, phos 3.2 on 04/02/15 - calcium acetate 3 tabs with meals, restart - continue cinacalcet.   4. Anemia of chronic kidney disease: hemoglobin 7.6. Recent GI bleed - epo with treatment.   5. Hypertension: well controlled.  - amlodipine, benazepril, labetolol   LOS: 0 Stanley [name] 1/23/201712:28 PM

## 2015-04-06 NOTE — Progress Notes (Addendum)
Spoke to dialysis. Patient did well; B/P 130/79 & they were able to get one liter fluid off. Patient will be leaving from dialysis to [name] for his peracentesis & then return to his room.

## 2015-04-06 NOTE — Progress Notes (Signed)
Hemodialysis start 

## 2015-04-07 ENCOUNTER — Inpatient Hospital Stay (HOSPITAL_COMMUNITY)
Admit: 2015-04-07 | Discharge: 2015-04-07 | Disposition: A | Discharge status: Home / Self Care | Payer: Medicare Other | Attending: Internal Medicine | Admitting: Internal Medicine

## 2015-04-07 DIAGNOSIS — R0600 Dyspnea, unspecified: Secondary | ICD-10-CM

## 2015-04-07 LAB — CBC
HEMATOCRIT: 28.8 % — AB (ref 40.0–52.0)
Hemoglobin: 8.8 g/dL — ABNORMAL LOW (ref 13.0–18.0)
MCH: 29 pg (ref 26.0–34.0)
MCHC: 30.4 g/dL — AB (ref 32.0–36.0)
MCV: 95.3 fL (ref 80.0–100.0)
Platelets: 261 10*3/uL (ref 150–440)
RBC: 3.02 MIL/uL — ABNORMAL LOW (ref 4.40–5.90)
RDW: 22.3 % — AB (ref 11.5–14.5)
WBC: 13.8 10*3/uL — ABNORMAL HIGH (ref 3.8–10.6)

## 2015-04-07 LAB — BASIC METABOLIC PANEL
ANION GAP: 10 (ref 5–15)
BUN: 44 mg/dL — AB (ref 6–20)
CALCIUM: 8.6 mg/dL — AB (ref 8.9–10.3)
CO2: 28 mmol/L (ref 22–32)
CREATININE: 5.61 mg/dL — AB (ref 0.61–1.24)
Chloride: 100 mmol/L — ABNORMAL LOW (ref 101–111)
GFR calc Af Amer: 12 mL/min — ABNORMAL LOW (ref >60)
GFR, EST NON AFRICAN AMERICAN: 10 mL/min — AB (ref >60)
GLUCOSE: 94 mg/dL (ref 65–99)
Potassium: 5.2 mmol/L — ABNORMAL HIGH (ref 3.5–5.1)
Sodium: 138 mmol/L (ref 135–145)

## 2015-04-07 MED ORDER — EPOETIN ALFA 10000 UNIT/ML IJ SOLN
10000.0000 [IU] | Freq: Once | INTRAMUSCULAR | Status: AC
  Administered 2015-04-07: 10000 [IU] via INTRAVENOUS
  Filled 2015-04-07: qty 1

## 2015-04-07 MED ORDER — FUROSEMIDE 40 MG PO TABS: 40.0000 mg | ORAL_TABLET | Freq: Every day | ORAL | Status: DC

## 2015-04-07 NOTE — Progress Notes (Signed)
*  PRELIMINARY RESULTS* Echocardiogram 2D Echocardiogram has been performed.  Stanley [name] 04/07/2015, 9:02 AM

## 2015-04-07 NOTE — Discharge Planning (Addendum)
Pt IV and tele removed.  DC papers given, explained and educated.  RN VS and assessment revealed stability for DC to home.  Told of suggested FU appts and script sent to pharm.  Pt also given pain meds just prior to DC and had dialysis.  Family unable to transport at this time since both working. Pt indicates that noone is available.  Social work asked  To assess and will tray to assist pt to get home.

## 2015-04-07 NOTE — Progress Notes (Signed)
Pre-hd tx 

## 2015-04-07 NOTE — Progress Notes (Signed)
Per RN patient does not have a ride home. Clinical Education officer, museum (CSW) met with patient to assess. Per patient he lives in [place] and uses [place] transportation via [place] to get to Dialysis. Per patient his sister is at work and cannot pick him up. CSW provided patient with taxi voucher. RN aware of above. Please reconsult if future social work needs arise. CSW signing off.   Blima Rich, LCSW [phone]

## 2015-04-07 NOTE — Progress Notes (Signed)
[facility]  ROUNDING NOTE   Subjective:   Seen and examined on hemodialysis. Had hemodialysis yesterday for hyperkalemia.  Patient is tolerating treatment well. Reports no more shortness of breath.   Paracentesis yesterday. 4 litres removed.   Objective:  Vital signs in last 24 hours:  Temp:  [97.6 F (36.4 C)-99.2 F (37.3 C)] 97.8 F (36.6 C) (01/24 0900) Pulse Rate:  [69-91] 71 (01/24 1000) Resp:  [11-20] 13 (01/24 1030) BP: (112-132)/(67-85) 120/76 mmHg (01/24 1030) SpO2:  [94 %-100 %] 100 % (01/24 0900) Weight:  [82.736 kg (182 lb 6.4 oz)-85.5 kg (188 lb 7.9 oz)] 82.736 kg (182 lb 6.4 oz) (01/24 0900)  Weight change: 4.853 kg (10 lb 11.2 oz) Filed Weights   04/06/15 1606 04/07/15 0432 04/07/15 0900  Weight: 83 kg (182 lb 15.7 oz) 82.736 kg (182 lb 6.4 oz) 82.736 kg (182 lb 6.4 oz)    Intake/Output: I/O last 3 completed shifts: In: 360 [P.O.:360] Out: 1525 [Urine:525; Other:1000]   Intake/Output this shift:  Total I/O In: -  Out: 100 [Urine:100]  Physical Exam: General: NAD, laying in bed.   Head: Normocephalic, atraumatic. Moist oral mucosal membranes  Eyes: Anicteric, PERRL  Neck: Supple, trachea midline  Lungs:  Clear to auscultation  Heart: Regular rate and rhythm  Abdomen:  +distended, nontender  Extremities: no peripheral edema.  Neurologic: Nonfocal, moving all four extremities  Skin: No lesions  Access: Right arm AVF     Basic Metabolic Panel:  Recent Labs Lab 04/05/15 2240 04/06/15 0556 04/06/15 1129 04/07/15 0614  NA 142 140 138 138  K 4.7 6.3* 6.0* 5.2*  CL 104 106 104 100*  CO2 27 24 22 28   GLUCOSE 112* 124* 144* 94  BUN 49* 57* 63* 44*  CREATININE 7.10* 7.35* 7.35* 5.61*  CALCIUM 9.6 9.5 9.3 8.6*  PHOS  --   --  4.4  --     Liver Function Tests:  Recent Labs Lab 04/05/15 2240 04/06/15 1129  AST 37  --   ALT 51  --   ALKPHOS 141*  --   BILITOT 0.8  --   PROT 6.6  --   ALBUMIN 3.1* 3.1*   No results for  input(s): LIPASE, AMYLASE in the last 168 hours. No results for input(s): AMMONIA in the last 168 hours.  CBC:  Recent Labs Lab 04/05/15 2240 04/06/15 0556 04/06/15 1129 04/07/15 0614  WBC 14.2* 11.2* 12.9* 13.8*  NEUTROABS 12.1*  --   --   --   HGB 8.0* 7.9* 7.6* 8.8*  HCT 26.4* 26.0* 25.5* 28.8*  MCV 94.9 95.2 93.1 95.3  PLT 279 258 247 261    Cardiac Enzymes:  Recent Labs Lab 04/05/15 2240 04/06/15 0134  TROPONINI 0.05* 0.06*    BNP: Invalid input(s): POCBNP  CBG: No results for input(s): GLUCAP in the last 168 hours.  Microbiology: Results for orders placed or performed during the hospital encounter of 03/23/15  Culture, group A strep     Status: None   Collection Time: 03/25/15  5:20 PM  Result Value Ref Range Status   Specimen Description THROAT  Final   Special Requests NONE  Final   Culture NO BETA HEMOLYTIC STREPTOCOCCI ISOLATED  Final   Report Status 03/27/2015 FINAL  Final    Coagulation Studies: No results for input(s): LABPROT, INR in the last 72 hours.  Urinalysis: No results for input(s): COLORURINE, LABSPEC, PHURINE, GLUCOSEU, HGBUR, BILIRUBINUR, KETONESUR, PROTEINUR, UROBILINOGEN, NITRITE, LEUKOCYTESUR in the last 72 hours.  Invalid input(s): APPERANCEUR    Imaging: Dg Chest 2 View  04/05/2015  CLINICAL DATA:  Acute onset of shortness of breath. Generalized abdominal distention. Initial encounter. EXAM: CHEST  2 VIEW COMPARISON:  Chest radiograph performed 03/26/2015 FINDINGS: The lungs are well-aerated. Vascular congestion is noted. There is no evidence of focal opacification, pleural effusion or pneumothorax. The heart is borderline enlarged. The patient is status post median sternotomy. No acute osseous abnormalities are seen. IMPRESSION: Vascular congestion and borderline cardiomegaly. Lungs remain grossly clear. Electronically Signed   By: Garald Balding M.D.   On: 04/05/2015 23:48   US Paracentesis  04/06/2015  INDICATION: 55 year old  male with recurrent large volume paracentesis EXAM: ULTRASOUND GUIDED  PARACENTESIS MEDICATIONS: None. ANESTHESIA/SEDATION: None COMPLICATIONS: None immediate. PROCEDURE: Informed written consent was obtained from the patient after a discussion of the risks, benefits and alternatives to treatment. A timeout was performed prior to the initiation of the procedure. Initial ultrasound scanning demonstrates a large amount of ascites within the right lower abdominal quadrant. The right lower abdomen was prepped and draped in the usual sterile fashion. 1% lidocaine with epinephrine was used for local anesthesia. A 6 French Safe-T-Centesis catheter was then advanced into the peritoneal space. An ultrasound image was saved for documentation purposes. The paracentesis was performed. The catheter was removed and a dressing was applied. The patient tolerated the procedure well without immediate post procedural complication. FINDINGS: A total of approximately 4000 mL of yellow ascitic fluid was removed. Samples were sent to the laboratory as requested by the clinical team. IMPRESSION: Successful ultrasound-guided paracentesis yielding 4 L liters of peritoneal fluid. Electronically Signed   By: Jacqulynn Cadet M.D.   On: 04/06/2015 17:03     Medications:     . amLODipine  5 mg Oral Daily  . atorvastatin  40 mg Oral Daily  . azithromycin  500 mg Oral Daily  . benazepril  10 mg Oral Daily  . calcium acetate  2,001 mg Oral TID WC  . cinacalcet  30 mg Oral Q breakfast  . furosemide  40 mg Oral Daily  . heparin  5,000 Units Subcutaneous 3 times per day  . labetalol  200 mg Oral Daily  . pantoprazole  40 mg Oral QAC breakfast  . sodium chloride  3 mL Intravenous Q12H   ipratropium-albuterol, ondansetron **OR** ondansetron (ZOFRAN) IV, oxyCODONE-acetaminophen, traMADol  Assessment/ Plan:  [name] is a 55 y.o. black male with end-stage renal disease, dialysis at [facility], coronary  disease, hypertension, history of alcohol abuse, history of drug abuse  [facility] TTS  1. End-stage renal disease:  Tolerating treatment well. Extra treatment yesterday due to hyperkalemia.  - Low potassium diet education provided - continue TTS schedule.  - discontinued spironolactone. Patient has hyperkalemia and not a candidate for this agent. He was previously on furosemide 40mg  daily, will restart.  2. Ascites from liver cirrhosis: status post paracentesis on 04/06/15. 4 litres removed.   3. Secondary hyperparathyroidism: outpatient PTH 181, phos 3.2 on 04/02/15 - calcium acetate 3 tabs with meals - continue cinacalcet.   4. Anemia of chronic kidney disease: hemoglobin 8.8. Recent GI bleed - epo with treatment.   5. Hypertension: well controlled.  - amlodipine, benazepril, labetolol   LOS: [place], [place] Stites 1/24/201711:29 AM

## 2015-04-07 NOTE — Progress Notes (Signed)
Hemodialysis start 

## 2015-04-07 NOTE — Progress Notes (Signed)
Hemodialysis completed. 

## 2015-04-07 NOTE — Discharge Summary (Addendum)
[facility] [place] NAME: Stanley [name]    MR#:  [id number]  DATE OF BIRTH:  11-21-1960  DATE OF ADMISSION:  04/05/2015 ADMITTING PHYSICIAN: Lance Coon, MD  DATE OF DISCHARGE: 04/07/2015 PRIMARY CARE PHYSICIAN: Elyse Jarvis, MD    ADMISSION DIAGNOSIS:  Dyspnea [R06.00] ESRD (end stage renal disease) on dialysis ([place]) [N18.6, Z99.2] Chronic obstructive pulmonary disease with acute exacerbation (HCC) [J44.1]  DISCHARGE DIAGNOSIS:  Principal Problem:   Dyspnea Active Problems:   Coronary atherosclerosis of native coronary artery   ESRD on dialysis (HCC)   HTN (hypertension)   GERD (gastroesophageal reflux disease)   HLD (hyperlipidemia)   Cirrhosis (HCC)   Ascites   SECONDARY DIAGNOSIS:   Past Medical History  Diagnosis Date  . Coronary artery disease   . Hypertension   . GERD (gastroesophageal reflux disease)   . Suicidal ideation     & HOMICIDAL IDEATION --  06-16-2013   ADMITTED TO BEHAVIOR HEALTH  . Alcohol abuse   . Drug abuse   . Diabetic peripheral neuropathy ([place])   . End stage renal disease on dialysis [facility]) NEPHROLOGIST-   DR [facility]  IN [place]    HEMODIALYSIS --   TUES/  THURS/  SAT  . Cirrhosis [facility])     HOSPITAL COURSE:   1.dyspnea/shortness of breath: I suspect this is related to his underlying renal and liver disease. He will need a formal evaluation for COPD. his symptoms subsided after dialysis and paracentesis.  ECHO shows EF 50-55% with moderate-severe MR.   2. [name]: Patient received dialysis while in the hospital. He will resume his Tuesday, Thursday and Saturday schedule.   3. Liver cirrhosis due to EtOH: He had a paracentesis during this hospital stay. He had 5 L of fluid removed. He was started on Lasix by the nephrologist. He does make a little urine..  4. Essential hypertension: Continue Benzapril and labetalol.  5. Tobacco dependence: Patient  says he smokes once in while. He is encouraged to completely stop smoking. Patient was counseled for 3 minutes. He reports he does not need a nicotine patch.  6. Anemia of chronic disease: It appears the patient has a baseline hemoglobin of approximately 8. He is at his baseline. Continue to monitor.   DISCHARGE CONDITIONS AND DIET:   Stable for discharge on a renal diet  CONSULTS OBTAINED:  Treatment Team:  Lavonia Dana, MD  DRUG ALLERGIES:  No Known Allergies  DISCHARGE MEDICATIONS:   Current Discharge Medication List    START taking these medications   Details  furosemide (LASIX) 40 MG tablet Take 1 tablet (40 mg total) by mouth daily. Qty: 30 tablet, Refills: 0      CONTINUE these medications which have NOT CHANGED   Details  amLODipine (NORVASC) 5 MG tablet Take 5 mg by mouth daily.     atorvastatin (LIPITOR) 40 MG tablet Take 40 mg by mouth daily.    benazepril (LOTENSIN) 10 MG tablet Take 10 mg by mouth daily.    calcium acetate (PHOSLO) 667 MG capsule Take 2,001 mg by mouth 3 (three) times daily with meals.    cinacalcet (SENSIPAR) 30 MG tablet Take 1 tablet by mouth daily.    gabapentin (NEURONTIN) 300 MG capsule Take 1 capsule (300 mg total) by mouth daily. Qty: 30 capsule, Refills: 0    labetalol (NORMODYNE) 200 MG tablet Take 200 mg by mouth daily.     Multiple Vitamins-Minerals (MULTIVITAMIN WITH  MINERALS) tablet Take 1 tablet by mouth daily.    pantoprazole (PROTONIX) 40 MG tablet Take 1 tablet (40 mg total) by mouth daily. Qty: 30 tablet, Refills: 0      STOP taking these medications     levofloxacin (LEVAQUIN) 250 MG tablet      predniSONE (DELTASONE) 10 MG tablet               Today   CHIEF COMPLAINT:  Patient is doing well this morning. Patient denies shortness of breath.   VITAL SIGNS:  Blood pressure 120/76, pulse 71, temperature 97.8 F (36.6 C), temperature source Oral, resp. rate 13, height 6\' 3"  (1.905 m), weight 82.736  kg (182 lb 6.4 oz), SpO2 100 %.   REVIEW OF SYSTEMS:  Review of Systems  Constitutional: Negative for fever, chills and malaise/fatigue.  HENT: Negative for sore throat.   Eyes: Negative for blurred vision.  Respiratory: Negative for cough, hemoptysis, shortness of breath and wheezing.   Cardiovascular: Negative for chest pain, palpitations and leg swelling.  Gastrointestinal: Negative for nausea, vomiting, abdominal pain, diarrhea and blood in stool.  Genitourinary: Negative for dysuria.  Musculoskeletal: Negative for back pain.  Neurological: Negative for dizziness, tremors and headaches.  Endo/Heme/Allergies: Does not bruise/bleed easily.     PHYSICAL EXAMINATION:  GENERAL:  55 y.o.-year-old patient lying in the bed with no acute distress.  NECK:  Supple, no jugular venous distention. No thyroid enlargement, no tenderness.  LUNGS: Normal breath sounds bilaterally, no wheezing, rales,rhonchi  No use of accessory muscles of respiration.  CARDIOVASCULAR: S1, S2 normal. No murmurs, rubs, or gallops.  ABDOMEN: Soft, non-tender, non-distended. Bowel sounds present. No organomegaly or mass.  EXTREMITIES: No pedal edema, cyanosis, or clubbing.  PSYCHIATRIC: The patient is alert and oriented x 3.  SKIN: No obvious rash, lesion, or ulcer.   DATA REVIEW:   CBC  Recent Labs Lab 04/07/15 0614  WBC 13.8*  HGB 8.8*  HCT 28.8*  PLT 261    Chemistries   Recent Labs Lab 04/05/15 2240  04/07/15 0614  NA 142  < > 138  K 4.7  < > 5.2*  CL 104  < > 100*  CO2 27  < > 28  GLUCOSE 112*  < > 94  BUN 49*  < > 44*  CREATININE 7.10*  < > 5.61*  CALCIUM 9.6  < > 8.6*  AST 37  --   --   ALT 51  --   --   ALKPHOS 141*  --   --   BILITOT 0.8  --   --   < > = values in this interval not displayed.  Cardiac Enzymes  Recent Labs Lab 04/05/15 2240 04/06/15 0134  TROPONINI 0.05* 0.06*    Microbiology Results  @[email or web address] @  RADIOLOGY:  Dg Chest 2 View  04/05/2015  CLINICAL  DATA:  Acute onset of shortness of breath. Generalized abdominal distention. Initial encounter. EXAM: CHEST  2 VIEW COMPARISON:  Chest radiograph performed 03/26/2015 FINDINGS: The lungs are well-aerated. Vascular congestion is noted. There is no evidence of focal opacification, pleural effusion or pneumothorax. The heart is borderline enlarged. The patient is status post median sternotomy. No acute osseous abnormalities are seen. IMPRESSION: Vascular congestion and borderline cardiomegaly. Lungs remain grossly clear. Electronically Signed   By: Garald Balding M.D.   On: 04/05/2015 23:48   US Paracentesis  04/06/2015  INDICATION: 55 year old male with recurrent large volume paracentesis EXAM: ULTRASOUND GUIDED  PARACENTESIS MEDICATIONS: None. ANESTHESIA/SEDATION: None COMPLICATIONS:  None immediate. PROCEDURE: Informed written consent was obtained from the patient after a discussion of the risks, benefits and alternatives to treatment. A timeout was performed prior to the initiation of the procedure. Initial ultrasound scanning demonstrates a large amount of ascites within the right lower abdominal quadrant. The right lower abdomen was prepped and draped in the usual sterile fashion. 1% lidocaine with epinephrine was used for local anesthesia. A 6 French Safe-T-Centesis catheter was then advanced into the peritoneal space. An ultrasound image was saved for documentation purposes. The paracentesis was performed. The catheter was removed and a dressing was applied. The patient tolerated the procedure well without immediate post procedural complication. FINDINGS: A total of approximately 4000 mL of yellow ascitic fluid was removed. Samples were sent to the laboratory as requested by the clinical team. IMPRESSION: Successful ultrasound-guided paracentesis yielding 4 L liters of peritoneal fluid. Electronically Signed   By: Jacqulynn Cadet M.D.   On: 04/06/2015 17:03      Management plans discussed with the  patient and he is in agreement. Stable for discharge   Patient should follow up with PCP 1 week  CODE STATUS:     Code Status Orders        Start     Ordered   04/06/15 0508  Full code   Continuous     04/06/15 0507    Code Status History    Date Active Date Inactive Code Status Order ID Comments User Context   03/23/2015 11:07 AM 03/27/2015 12:46 PM Full Code [id number]  Bettey Costa, MD Inpatient   06/19/2013 11:32 AM 06/21/2013  5:49 PM Full Code [id number]  Cristal Ford, DO Inpatient   06/16/2013  5:26 PM 06/19/2013 11:32 AM Full Code [id number]  Margarita Mail, PA-C ED   06/05/2013  3:33 PM 06/06/2013  6:56 PM Full Code [id number]  Theodoro Kos, DO Inpatient   06/02/2013  9:01 AM 06/05/2013  3:33 PM Full Code [id number]  Velvet Bathe, MD Inpatient   06/02/2013  8:40 AM 06/02/2013  9:01 AM Full Code [id number]  Velvet Bathe, MD Inpatient      TOTAL TIME TAKING CARE OF THIS PATIENT: 35 minutes.    Note: This dictation was prepared with Dragon dictation along with smaller phrase technology. Any transcriptional errors that result from this process are unintentional.  Nasif Bos M.D on 04/07/2015 at 11:25 AM  Between 7am to 6pm - Pager - [phone] After 6pm go to [email or web address] - password [email or web address] [facility]  Office  [phone]  CC: Primary care physician; Elyse Jarvis, MD

## 2015-04-07 NOTE — Progress Notes (Signed)
Initial Nutrition Assessment   INTERVENTION:   Meals and Snacks: Cater to patient preferences on Renal Diet order Education: will review/educate pt on recommended nutrition therapy for dialysis pt's on follow as pt out of room on visit today (and yesterday). Left written materials in pt room called  'Grocery List Suggestions for Dialysis patients'   NUTRITION DIAGNOSIS:   Altered nutrition lab value related to chronic illness as evidenced by  (Electrolyte and renal profile).   GOAL:   Patient will meet greater than or equal to 90% of their needs  MONITOR:    (Energy Intake, Electrolyte and renal Profile, Anthropometrics, Digestive system)  REASON FOR ASSESSMENT:    (Renal diet order)    ASSESSMENT:   Pt admitted with SOB and hyperkalemia. Pt with h/o ESRD on HD; Nephrology following. Pt out of room today, currently at dialysis. Pt s/p paracentesis secondary to cirrhosis yesterday and s/p dialysis yesterday.  Past Medical History  Diagnosis Date  . Coronary artery disease   . Hypertension   . GERD (gastroesophageal reflux disease)   . Suicidal ideation     & HOMICIDAL IDEATION --  06-16-2013   ADMITTED TO BEHAVIOR HEALTH  . Alcohol abuse   . Drug abuse   . Diabetic peripheral neuropathy ([place])   . End stage renal disease on dialysis [facility]) NEPHROLOGIST-   DR [facility]  IN [place]    HEMODIALYSIS --   TUES/  THURS/  SAT  . Cirrhosis ([place])      Diet Order:  Diet renal with fluid restriction Fluid restriction:: 1200 mL Fluid; Room service appropriate?: Yes; Fluid consistency:: Thin    Current Nutrition: Unsure if pt ate breakfast this am before dialysis  Food/Nutrition-Related History: Per Nsg yesterday after dialysis pt ate very well for dinner last night and asking for more, recorded 100%. Per MST no decrease in appetite PTA. RD notes per nephrology pt ate 6 bananas PTA.   Scheduled Medications:  . amLODipine  5 mg Oral Daily  . atorvastatin  40 mg Oral Daily  .  azithromycin  500 mg Oral Daily  . benazepril  10 mg Oral Daily  . calcium acetate  2,001 mg Oral TID WC  . cinacalcet  30 mg Oral Q breakfast  . furosemide  40 mg Oral Daily  . heparin  5,000 Units Subcutaneous 3 times per day  . labetalol  200 mg Oral Daily  . pantoprazole  40 mg Oral QAC breakfast  . sodium chloride  3 mL Intravenous Q12H     Electrolyte/Renal Profile and Glucose Profile:   Recent Labs Lab 04/06/15 0556 04/06/15 1129 04/07/15 0614  NA 140 138 138  K 6.3* 6.0* 5.2*  CL 106 104 100*  CO2 24 22 28   BUN 57* 63* 44*  CREATININE 7.35* 7.35* 5.61*  CALCIUM 9.5 9.3 8.6*  PHOS  --  4.4  --   GLUCOSE 124* 144* 94   Protein Profile:  Recent Labs Lab 04/05/15 2240 04/06/15 1129  ALBUMIN 3.1* 3.1*    Gastrointestinal Profile: Last BM:  04/05/2015   Nutrition-Focused Physical Exam Findings:  Unable to complete Nutrition-Focused physical exam at this time.    Weight Change: Per [facility] weight encounters weight relatively stable this month    Height:   Ht Readings from Last 1 Encounters:  04/05/15 6\' 3"  (1.905 m)    Weight:   Wt Readings from Last 1 Encounters:  04/07/15 182 lb 6.4 oz (82.736 kg)   Wt Readings from Last 10 Encounters:  04/07/15 182 lb 6.4 oz (82.736 kg)  03/26/15 182 lb 15.7 oz (83 kg)  12/04/14 180 lb (81.647 kg)  10/20/14 170 lb (77.111 kg)  07/22/14 188 lb (85.276 kg)  07/19/14 183 lb (83.008 kg)  07/18/14 183 lb (83.008 kg)  06/22/14 170 lb (77.111 kg)  07/29/13 170 lb 8 oz (77.338 kg)  06/20/13 177 lb 11.1 oz (80.6 kg)     BMI:  Body mass index is 22.8 kg/(m^2).  Estimated Nutritional Needs:   Kcal:  BEE: 1735kcals, TEE: (IF 1.0-1.2)(AF 1.3) 2255-2706kcals  Protein:  98-123g protein (1.2-1.5g/kg)  Fluid:  UOP+1L  EDUCATION NEEDS:   Education needs addressed   [place], RD, LDN Pager [phone] Weekend/On-Call Pager [phone]

## 2015-04-07 NOTE — Progress Notes (Signed)
Cab here to take patient home. Patient discharge via wheelchair.

## 2015-04-09 ENCOUNTER — Ambulatory Visit
Admission: RE | Admit: 2015-04-09 | Discharge: 2015-04-09 | Disposition: A | Discharge status: Home / Self Care | Payer: Medicare Other | Source: Ambulatory Visit | Attending: Gastroenterology | Admitting: Gastroenterology

## 2015-04-09 DIAGNOSIS — R188 Other ascites: Principal | ICD-10-CM

## 2015-04-09 DIAGNOSIS — K7460 Unspecified cirrhosis of liver: Secondary | ICD-10-CM

## 2015-04-09 DIAGNOSIS — K7030 Alcoholic cirrhosis of liver without ascites: Secondary | ICD-10-CM | POA: Diagnosis not present

## 2015-04-09 NOTE — Consult Note (Signed)
Chief Complaint: Patient was seen in consultation today for  Chief Complaint  Patient presents with  . Advice Only    Consult for TIPS     at the request of Oh,Paul Y  Referring Physician(s): Oh,Paul Y  History of Present Illness: Stanley [name] is a 55 y.o. male with alcoholic cirrhosis with ascites. Patient's past medical history is also significant for end-stage renal disease on dialysis. The patient has been on dialysis for approximately 7 years. The patient developed ascites last year and had his first paracentesis in May 2016. Since September, he's been having paracentesis procedure almost every month although he did not require one in December. He has required two paracentesis procedures this month. Patient complains of shortness of breath and lower extremity swelling prior to the paracentesis procedures. He says of the symptoms resolve after the paracentesis procedure. Patient has no history of GI bleeding and he denies any encephalopathy symptoms. The patient's is a long time alcohol user but says he's stopped drinking 7 months ago. The patient has been smoking tobacco for most of his life and continues to smoke intermittently.  Past Medical History  Diagnosis Date  . Coronary artery disease   . Hypertension   . GERD (gastroesophageal reflux disease)   . Suicidal ideation     & HOMICIDAL IDEATION --  06-16-2013   ADMITTED TO BEHAVIOR HEALTH  . Alcohol abuse   . Drug abuse   . Diabetic peripheral neuropathy ([place])   . End stage renal disease on dialysis [facility]) NEPHROLOGIST-   DR [facility]  IN [place]    HEMODIALYSIS --   TUES/  THURS/  SAT  . Cirrhosis [facility])     Past Surgical History  Procedure Laterality Date  . Dialysis fistula creation  LAST SURGERY  APPOX  2008  . Orif mandibular fracture N/A 06/05/2013    Procedure: REPAIR OF MANDIBULAR FRACTURE x 2 with maxillo-mandibular fixation ;  Surgeon: Theodoro Kos, DO;  Location: [place];  Service: Plastics;  Laterality:  N/A;  . Coronary artery bypass graft  2008  ([place] , )    3 VESSEL  . Coronary angioplasty  ?    PT UNABLE TO TELL IF  BEFORE OR AFTER  CABG  . Mandibular hardware removal N/A 07/29/2013    Procedure: REMOVAL OF ARCH BARS;  Surgeon: Theodoro Kos, DO;  Location: [place];  Service: Plastics;  Laterality: N/A;    Allergies: Review of patient's allergies indicates no known allergies.  Medications: Prior to Admission medications   Medication Sig Start Date End Date Taking? Authorizing Provider  amLODipine (NORVASC) 5 MG tablet Take 5 mg by mouth daily.    Yes Historical Provider, MD  aspirin 81 MG tablet Take 81 mg by mouth daily.   Yes Historical Provider, MD  atorvastatin (LIPITOR) 40 MG tablet Take 40 mg by mouth daily.   Yes Historical Provider, MD  benazepril (LOTENSIN) 10 MG tablet Take 10 mg by mouth daily.   Yes Historical Provider, MD  calcium acetate (PHOSLO) 667 MG capsule Take 2,001 mg by mouth 3 (three) times daily with meals.   Yes Historical Provider, MD  cinacalcet (SENSIPAR) 30 MG tablet Take 1 tablet by mouth daily.   Yes Historical Provider, MD  furosemide (LASIX) 40 MG tablet Take 1 tablet (40 mg total) by mouth daily. 04/07/15  Yes Bettey Costa, MD  gabapentin (NEURONTIN) 300 MG capsule Take 1 capsule (300 mg total) by mouth daily. 03/27/15  Yes Sital  Mody, MD  labetalol (NORMODYNE) 200 MG tablet Take 200 mg by mouth daily.    Yes Historical Provider, MD  Multiple Vitamins-Minerals (MULTIVITAMIN WITH MINERALS) tablet Take 1 tablet by mouth daily.   Yes Historical Provider, MD  pantoprazole (PROTONIX) 40 MG tablet Take 1 tablet (40 mg total) by mouth daily. 03/25/15  Yes Bettey Costa, MD     Family History  Problem Relation Age of Onset  . Cancer Mother   . Cancer Father   . Cancer Sister     Social History   Social History  . Marital Status: Single    Spouse Name: N/A  . Number of Children: N/A  . Years of Education: N/A   Social History  Main Topics  . Smoking status: Light Tobacco Smoker -- 0.00 packs/day for 40 years    Types: Cigarettes  . Smokeless tobacco: Never Used  . Alcohol Use: No  . Drug Use: No     Comment: 06-18-2013  positive drug screen for cocaine--  (07-25-2013  DENIES ANY DRUG USE SINCE ADMISSION 06-16-2013)  . Sexual Activity: Not on file   Other Topics Concern  . Not on file   Social History Narrative     Review of Systems  Constitutional: Negative.   Respiratory: Positive for shortness of breath.   Cardiovascular: Positive for leg swelling. Negative for chest pain.  Gastrointestinal: Positive for abdominal distention. Negative for blood in stool.  Genitourinary: Negative.   Neurological: Negative.     Vital Signs: BP 114/64 mmHg  Pulse 80  Temp(Src) 98.6 F (37 C) (Oral)  Resp 14  Ht 6\' 3"  (1.905 m)  Wt 182 lb (82.555 kg)  BMI 22.75 kg/m2  SpO2 97%  Physical Exam  Constitutional: No distress.  Cardiovascular: Normal rate and regular rhythm.   Murmur heard. Systolic murmur near the apex.  Pulmonary/Chest: Effort normal and breath sounds normal.  Abdominal: Soft. Bowel sounds are normal. He exhibits distension.         Imaging: Dg Chest 1 View  03/25/2015  CLINICAL DATA:  Fever. Cad, htn, end stage renal disease on dialysis. Coronary artery bypass graft 2008. EXAM: CHEST 1 VIEW COMPARISON:  03/23/2015 FINDINGS: Status post median sternotomy and CABG. Heart is enlarged. There is pulmonary vascular congestion. No overt alveolar edema identified. There are no consolidations or pleural effusions. IMPRESSION: 1. Postoperative changes. 2. Cardiomegaly and vascular congestion. Electronically Signed   By: Nolon Nations M.D.   On: 03/25/2015 16:01   Dg Chest 2 View  04/05/2015  CLINICAL DATA:  Acute onset of shortness of breath. Generalized abdominal distention. Initial encounter. EXAM: CHEST  2 VIEW COMPARISON:  Chest radiograph performed 03/26/2015 FINDINGS: The lungs are  well-aerated. Vascular congestion is noted. There is no evidence of focal opacification, pleural effusion or pneumothorax. The heart is borderline enlarged. The patient is status post median sternotomy. No acute osseous abnormalities are seen. IMPRESSION: Vascular congestion and borderline cardiomegaly. Lungs remain grossly clear. Electronically Signed   By: Garald Balding M.D.   On: 04/05/2015 23:48   Dg Chest 2 View  03/26/2015  CLINICAL DATA:  Productive cough for several months. Low-grade fever. End-stage renal disease on dialysis. EXAM: CHEST  2 VIEW COMPARISON:  03/25/2015 FINDINGS: Moderate cardiomegaly is stable. Chronic pulmonary vascular congestion is stable. No evidence of pulmonary airspace disease or frank pulmonary edema. No evidence of pleural effusion. Prior CABG again noted. IMPRESSION: Stable cardiomegaly and chronic pulmonary venous hypertension. No acute findings. Electronically Signed   By: Jenny Reichmann  Kris Hartmann M.D.   On: 03/26/2015 14:27   Dg Chest 2 View  03/23/2015  CLINICAL DATA:  55 year old male with cirrhosis. Scheduled for upcoming paracentesis. Increased abdominal distention and shortness of breath today. Initial encounter. EXAM: CHEST  2 VIEW COMPARISON:  03/28/2014 and earlier FINDINGS: Seated AP and lateral views of the chest. Sequelae of CABG. Lower lung volumes. Stable cardiomegaly and mediastinal contours. No pleural effusion or pneumothorax. No consolidation. Chronic but increased interstitial markings in both lungs. Visualized tracheal air column is within normal limits. No acute osseous abnormality identified. No pneumoperitoneum. IMPRESSION: Lower lung volumes with chronic but increased interstitial markings favor due to pulmonary vascular congestion. Stable cardiomegaly, no pleural effusion. Electronically Signed   By: Genevie Ann M.D.   On: 03/23/2015 07:20   US Paracentesis  04/06/2015  INDICATION: 55 year old male with recurrent large volume paracentesis EXAM: ULTRASOUND GUIDED   PARACENTESIS MEDICATIONS: None. ANESTHESIA/SEDATION: None COMPLICATIONS: None immediate. PROCEDURE: Informed written consent was obtained from the patient after a discussion of the risks, benefits and alternatives to treatment. A timeout was performed prior to the initiation of the procedure. Initial ultrasound scanning demonstrates a large amount of ascites within the right lower abdominal quadrant. The right lower abdomen was prepped and draped in the usual sterile fashion. 1% lidocaine with epinephrine was used for local anesthesia. A 6 French Safe-T-Centesis catheter was then advanced into the peritoneal space. An ultrasound image was saved for documentation purposes. The paracentesis was performed. The catheter was removed and a dressing was applied. The patient tolerated the procedure well without immediate post procedural complication. FINDINGS: A total of approximately 4000 mL of yellow ascitic fluid was removed. Samples were sent to the laboratory as requested by the clinical team. IMPRESSION: Successful ultrasound-guided paracentesis yielding 4 L liters of peritoneal fluid. Electronically Signed   By: Jacqulynn Cadet M.D.   On: 04/06/2015 17:03   US Paracentesis  03/24/2015  CLINICAL DATA:  Recurrent symptomatic ascites, abdominal distention EXAM: ULTRASOUND GUIDED PARACENTESIS COMPARISON:  01/28/2015 PROCEDURE: An ultrasound guided paracentesis was thoroughly discussed with the patient and questions answered. The benefits, risks, alternatives and complications were also discussed. The patient understands and wishes to proceed with the procedure. Written consent was obtained. Ultrasound was performed to localize and mark an adequate pocket of fluid in the right lower quadrant of the abdomen. The area was then prepped and draped in the normal sterile fashion. 1% Lidocaine was used for local anesthesia. Under ultrasound guidance a safety centesis needle catheter was introduced. Paracentesis was  performed. The catheter was removed and a dressing applied. COMPLICATIONS: None immediate FINDINGS: A total of approximately 5.6 L of clear peritoneal fluid was removed. A fluid sample was sent for laboratory analysis. IMPRESSION: Successful ultrasound guided paracentesis yielding 5.6 L of ascites. Electronically Signed   By: Jerilynn Mages.  Shick M.D.   On: 03/24/2015 09:57    Labs:  CBC:  Recent Labs  04/05/15 2240 04/06/15 0556 04/06/15 1129 04/07/15 0614  WBC 14.2* 11.2* 12.9* 13.8*  HGB 8.0* 7.9* 7.6* 8.8*  HCT 26.4* 26.0* 25.5* 28.8*  PLT 279 258 247 261    COAGS:  Recent Labs  07/19/14 0843 07/23/14 0030 10/20/14 1258 03/23/15 1136 03/24/15 0508  INR 1.13 1.19 1.85 1.23 1.22  APTT 29  --  56*  --   --     BMP:  Recent Labs  04/05/15 2240 04/06/15 0556 04/06/15 1129 04/07/15 0614  NA 142 140 138 138  K 4.7 6.3* 6.0* 5.2*  CL  104 106 104 100*  CO2 27 24 22 28   GLUCOSE 112* 124* 144* 94  BUN 49* 57* 63* 44*  CALCIUM 9.6 9.5 9.3 8.6*  CREATININE 7.10* 7.35* 7.35* 5.61*  GFRNONAA 8* 7* 7* 10*  GFRAA 9* 9* 9* 12*    LIVER FUNCTION TESTS:  Recent Labs  07/22/14 2052 03/23/15 0528 03/24/15 0043 03/26/15 0505 04/05/15 2240 04/06/15 1129  BILITOT 0.9 0.9 1.0  --  0.8  --   AST 23 37 28  --  37  --   ALT 23 28 23   --  51  --   ALKPHOS 177* 181* 151*  --  141*  --   PROT 6.4* 7.7 7.2  --  6.6  --   ALBUMIN 2.8* 3.7 3.3* 3.0* 3.1* 3.1*    TUMOR MARKERS: No results for input(s): AFPTM, CEA, CA199, CHROMGRNA in the last 8760 hours.  Assessment and Plan:  55 year old with alcoholic cirrhosis and recurrent ascites. Past history is also significant for end-stage renal disease and chronic hemodialysis. I discussed a TIPS procedure with the patient in depth. I explained how a TIPS procedure can be effective at resolving or reducing the buildup of abdominal ascites and preventing the risk of upper GI bleeding. We also discussed the risks of the procedure which include  liver failure, bleeding and hepatic encephalopathy. Based on the patient's end-stage renal disease, his MELD score is 22. I explained to the patient that his MELD score significantly increases his risk for hepatic encephalopathy, liver failure and death following the procedure.  The patient has a good understanding of these risks and I would NOT recommend TIPS procedure in this patient. However, I explained to the patient that our experience with TIPS procedures and end-stage renal disease is limited and encouraged him to seek a second opinion at a transplant center if he does not want to continue with the regular paracentesis procedures.   Thank you for this interesting consult.  I greatly enjoyed meeting Stanley [name] and look forward to participating in their care.  A copy of this report was sent to the requesting provider on this date.  Electronically Signed: Carylon Perches 04/09/2015, 9:12 AM   I spent a total of 15 Minutes   in face to face in clinical consultation, greater than 50% of which was counseling/coordinating care for cirrhosis and ascites.

## 2015-04-11 DIAGNOSIS — D631 Anemia in chronic kidney disease: Secondary | ICD-10-CM | POA: Diagnosis not present

## 2015-04-11 DIAGNOSIS — Z992 Dependence on renal dialysis: Secondary | ICD-10-CM | POA: Diagnosis not present

## 2015-04-11 DIAGNOSIS — N186 End stage renal disease: Secondary | ICD-10-CM | POA: Diagnosis not present

## 2015-04-11 DIAGNOSIS — N2581 Secondary hyperparathyroidism of renal origin: Secondary | ICD-10-CM | POA: Diagnosis not present

## 2015-04-11 DIAGNOSIS — D509 Iron deficiency anemia, unspecified: Secondary | ICD-10-CM | POA: Diagnosis not present

## 2015-04-14 DIAGNOSIS — D509 Iron deficiency anemia, unspecified: Secondary | ICD-10-CM | POA: Diagnosis not present

## 2015-04-14 DIAGNOSIS — N186 End stage renal disease: Secondary | ICD-10-CM | POA: Diagnosis not present

## 2015-04-14 DIAGNOSIS — Z992 Dependence on renal dialysis: Secondary | ICD-10-CM | POA: Diagnosis not present

## 2015-04-14 DIAGNOSIS — N2581 Secondary hyperparathyroidism of renal origin: Secondary | ICD-10-CM | POA: Diagnosis not present

## 2015-04-14 DIAGNOSIS — D631 Anemia in chronic kidney disease: Secondary | ICD-10-CM | POA: Diagnosis not present

## 2015-04-15 DIAGNOSIS — N186 End stage renal disease: Secondary | ICD-10-CM | POA: Diagnosis not present

## 2015-04-15 DIAGNOSIS — Z992 Dependence on renal dialysis: Secondary | ICD-10-CM | POA: Diagnosis not present

## 2015-04-16 DIAGNOSIS — D631 Anemia in chronic kidney disease: Secondary | ICD-10-CM | POA: Diagnosis not present

## 2015-04-16 DIAGNOSIS — D509 Iron deficiency anemia, unspecified: Secondary | ICD-10-CM | POA: Diagnosis not present

## 2015-04-16 DIAGNOSIS — N2581 Secondary hyperparathyroidism of renal origin: Secondary | ICD-10-CM | POA: Diagnosis not present

## 2015-04-16 DIAGNOSIS — N186 End stage renal disease: Secondary | ICD-10-CM | POA: Diagnosis not present

## 2015-04-16 DIAGNOSIS — Z992 Dependence on renal dialysis: Secondary | ICD-10-CM | POA: Diagnosis not present

## 2015-04-16 DIAGNOSIS — K31811 Angiodysplasia of stomach and duodenum with bleeding: Secondary | ICD-10-CM | POA: Diagnosis not present

## 2015-04-18 DIAGNOSIS — D631 Anemia in chronic kidney disease: Secondary | ICD-10-CM | POA: Diagnosis not present

## 2015-04-18 DIAGNOSIS — N186 End stage renal disease: Secondary | ICD-10-CM | POA: Diagnosis not present

## 2015-04-18 DIAGNOSIS — N2581 Secondary hyperparathyroidism of renal origin: Secondary | ICD-10-CM | POA: Diagnosis not present

## 2015-04-18 DIAGNOSIS — Z992 Dependence on renal dialysis: Secondary | ICD-10-CM | POA: Diagnosis not present

## 2015-04-18 DIAGNOSIS — D509 Iron deficiency anemia, unspecified: Secondary | ICD-10-CM | POA: Diagnosis not present

## 2015-04-18 DIAGNOSIS — K31811 Angiodysplasia of stomach and duodenum with bleeding: Secondary | ICD-10-CM | POA: Diagnosis not present

## 2015-04-21 DIAGNOSIS — D509 Iron deficiency anemia, unspecified: Secondary | ICD-10-CM | POA: Diagnosis not present

## 2015-04-21 DIAGNOSIS — N2581 Secondary hyperparathyroidism of renal origin: Secondary | ICD-10-CM | POA: Diagnosis not present

## 2015-04-21 DIAGNOSIS — Z992 Dependence on renal dialysis: Secondary | ICD-10-CM | POA: Diagnosis not present

## 2015-04-21 DIAGNOSIS — K31811 Angiodysplasia of stomach and duodenum with bleeding: Secondary | ICD-10-CM | POA: Diagnosis not present

## 2015-04-21 DIAGNOSIS — D631 Anemia in chronic kidney disease: Secondary | ICD-10-CM | POA: Diagnosis not present

## 2015-04-21 DIAGNOSIS — N186 End stage renal disease: Secondary | ICD-10-CM | POA: Diagnosis not present

## 2015-04-23 DIAGNOSIS — D631 Anemia in chronic kidney disease: Secondary | ICD-10-CM | POA: Diagnosis not present

## 2015-04-23 DIAGNOSIS — N186 End stage renal disease: Secondary | ICD-10-CM | POA: Diagnosis not present

## 2015-04-23 DIAGNOSIS — N2581 Secondary hyperparathyroidism of renal origin: Secondary | ICD-10-CM | POA: Diagnosis not present

## 2015-04-23 DIAGNOSIS — Z992 Dependence on renal dialysis: Secondary | ICD-10-CM | POA: Diagnosis not present

## 2015-04-23 DIAGNOSIS — D509 Iron deficiency anemia, unspecified: Secondary | ICD-10-CM | POA: Diagnosis not present

## 2015-04-23 DIAGNOSIS — K31811 Angiodysplasia of stomach and duodenum with bleeding: Secondary | ICD-10-CM | POA: Diagnosis not present

## 2015-04-25 DIAGNOSIS — K31811 Angiodysplasia of stomach and duodenum with bleeding: Secondary | ICD-10-CM | POA: Diagnosis not present

## 2015-04-25 DIAGNOSIS — Z992 Dependence on renal dialysis: Secondary | ICD-10-CM | POA: Diagnosis not present

## 2015-04-25 DIAGNOSIS — N186 End stage renal disease: Secondary | ICD-10-CM | POA: Diagnosis not present

## 2015-04-25 DIAGNOSIS — D631 Anemia in chronic kidney disease: Secondary | ICD-10-CM | POA: Diagnosis not present

## 2015-04-25 DIAGNOSIS — N2581 Secondary hyperparathyroidism of renal origin: Secondary | ICD-10-CM | POA: Diagnosis not present

## 2015-04-25 DIAGNOSIS — D509 Iron deficiency anemia, unspecified: Secondary | ICD-10-CM | POA: Diagnosis not present

## 2015-04-28 DIAGNOSIS — D509 Iron deficiency anemia, unspecified: Secondary | ICD-10-CM | POA: Diagnosis not present

## 2015-04-28 DIAGNOSIS — K31811 Angiodysplasia of stomach and duodenum with bleeding: Secondary | ICD-10-CM | POA: Diagnosis not present

## 2015-04-28 DIAGNOSIS — N2581 Secondary hyperparathyroidism of renal origin: Secondary | ICD-10-CM | POA: Diagnosis not present

## 2015-04-28 DIAGNOSIS — D631 Anemia in chronic kidney disease: Secondary | ICD-10-CM | POA: Diagnosis not present

## 2015-04-28 DIAGNOSIS — N186 End stage renal disease: Secondary | ICD-10-CM | POA: Diagnosis not present

## 2015-04-28 DIAGNOSIS — Z992 Dependence on renal dialysis: Secondary | ICD-10-CM | POA: Diagnosis not present

## 2015-04-28 IMAGING — CR DG CHEST 1V PORT
1 series · 1 of 1 positions shown · non-contrast
Comparison: 01/04/2013.

CLINICAL DATA: CABG.

EXAM:
PORTABLE CHEST - 1 VIEW

[AP]
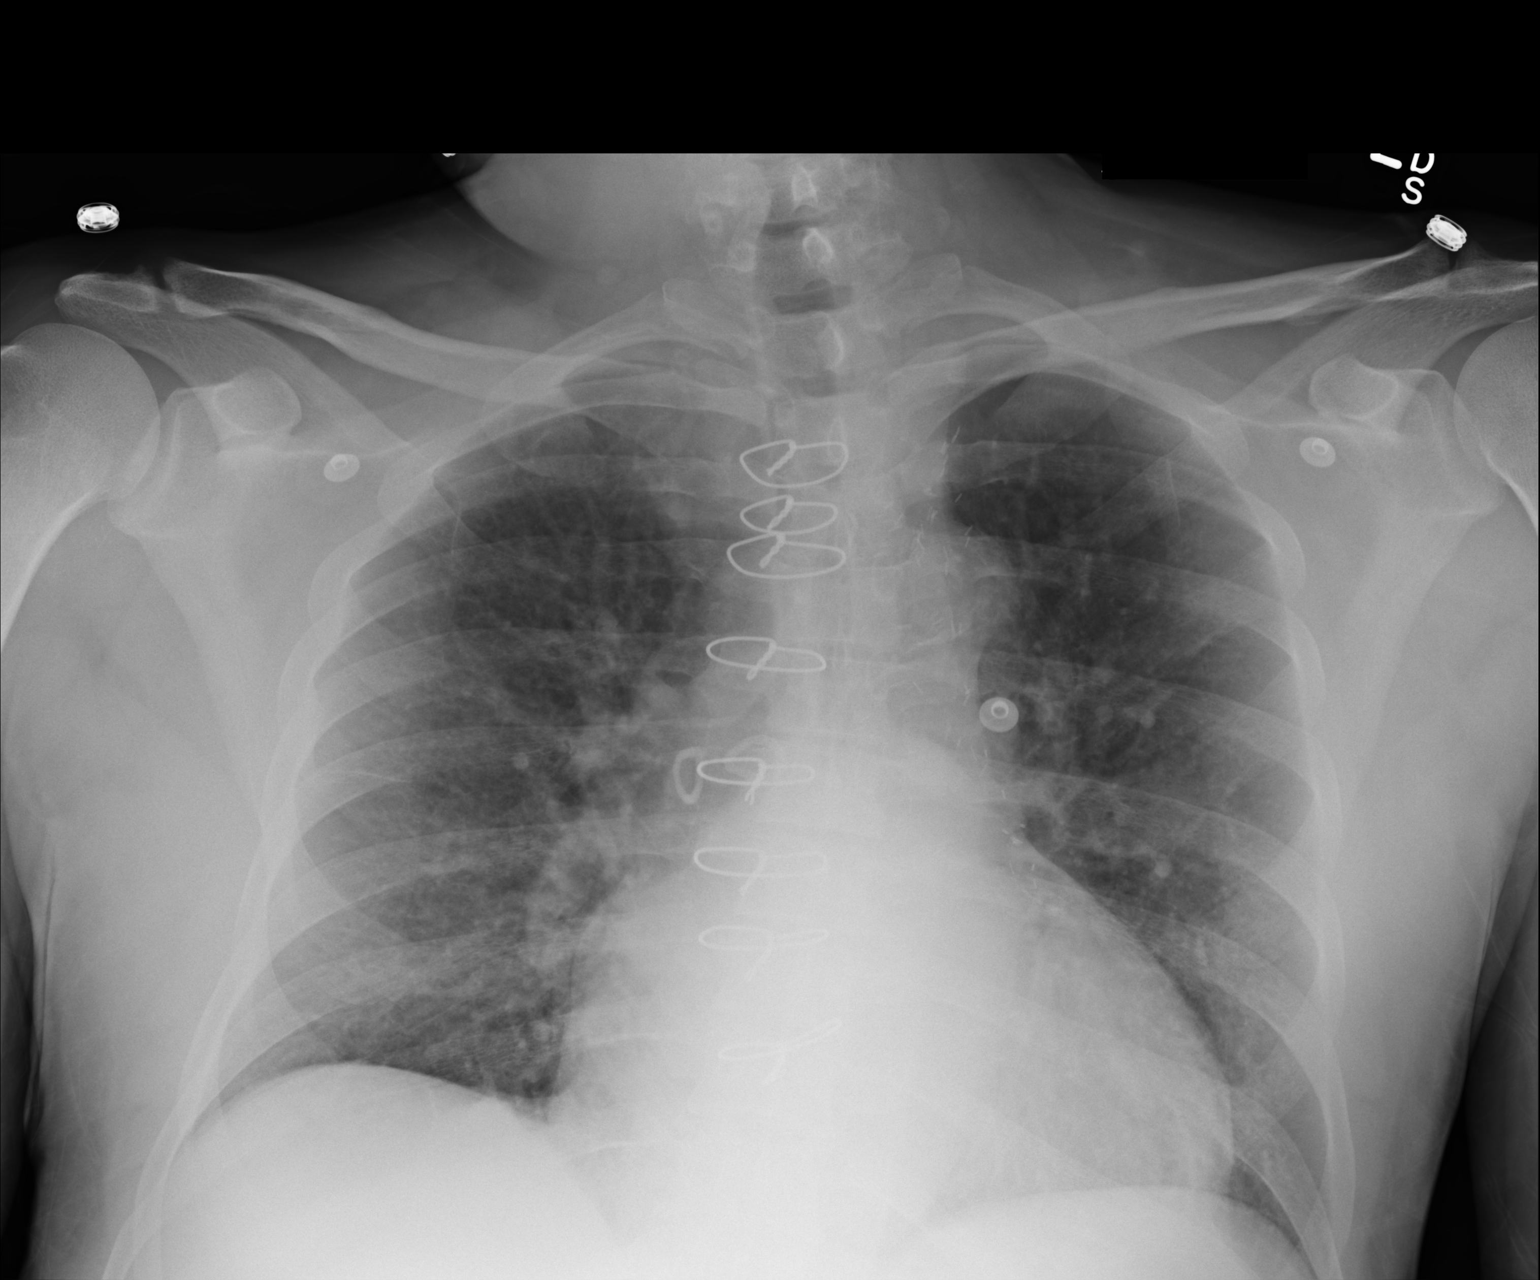

[1 of 1 positions shown; findings below may reference images not displayed]

FINDINGS: Prior CABG. Cardiomegaly. Mild pulmonary venous congestion
interstitial prominence. No pleural effusion or pneumothorax.
IMPRESSION: Mild congestive heart failure.
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
## 2015-04-30 ENCOUNTER — Inpatient Hospital Stay
Admission: EM | Admit: 2015-04-30 | Discharge: 2015-05-08 | DRG: 377 | Disposition: A | Discharge status: Home / Self Care | Payer: Medicare Other | Attending: Internal Medicine | Admitting: Internal Medicine

## 2015-04-30 ENCOUNTER — Encounter: Payer: Self-pay | Admitting: Emergency Medicine

## 2015-04-30 DIAGNOSIS — Z951 Presence of aortocoronary bypass graft: Secondary | ICD-10-CM

## 2015-04-30 DIAGNOSIS — Z809 Family history of malignant neoplasm, unspecified: Secondary | ICD-10-CM

## 2015-04-30 DIAGNOSIS — D500 Iron deficiency anemia secondary to blood loss (chronic): Secondary | ICD-10-CM | POA: Diagnosis not present

## 2015-04-30 DIAGNOSIS — M6281 Muscle weakness (generalized): Secondary | ICD-10-CM | POA: Diagnosis not present

## 2015-04-30 DIAGNOSIS — K921 Melena: Principal | ICD-10-CM | POA: Diagnosis present

## 2015-04-30 DIAGNOSIS — I120 Hypertensive chronic kidney disease with stage 5 chronic kidney disease or end stage renal disease: Secondary | ICD-10-CM | POA: Diagnosis present

## 2015-04-30 DIAGNOSIS — F1010 Alcohol abuse, uncomplicated: Secondary | ICD-10-CM | POA: Diagnosis present

## 2015-04-30 DIAGNOSIS — N186 End stage renal disease: Secondary | ICD-10-CM | POA: Diagnosis present

## 2015-04-30 DIAGNOSIS — F17200 Nicotine dependence, unspecified, uncomplicated: Secondary | ICD-10-CM | POA: Diagnosis not present

## 2015-04-30 DIAGNOSIS — D631 Anemia in chronic kidney disease: Secondary | ICD-10-CM | POA: Diagnosis present

## 2015-04-30 DIAGNOSIS — I2510 Atherosclerotic heart disease of native coronary artery without angina pectoris: Secondary | ICD-10-CM | POA: Diagnosis present

## 2015-04-30 DIAGNOSIS — J111 Influenza due to unidentified influenza virus with other respiratory manifestations: Secondary | ICD-10-CM | POA: Diagnosis present

## 2015-04-30 DIAGNOSIS — I25810 Atherosclerosis of coronary artery bypass graft(s) without angina pectoris: Secondary | ICD-10-CM | POA: Diagnosis not present

## 2015-04-30 DIAGNOSIS — I10 Essential (primary) hypertension: Secondary | ICD-10-CM | POA: Diagnosis not present

## 2015-04-30 DIAGNOSIS — Z992 Dependence on renal dialysis: Secondary | ICD-10-CM

## 2015-04-30 DIAGNOSIS — M79675 Pain in left toe(s): Secondary | ICD-10-CM | POA: Diagnosis present

## 2015-04-30 DIAGNOSIS — K7460 Unspecified cirrhosis of liver: Secondary | ICD-10-CM | POA: Diagnosis not present

## 2015-04-30 DIAGNOSIS — Z7982 Long term (current) use of aspirin: Secondary | ICD-10-CM | POA: Diagnosis not present

## 2015-04-30 DIAGNOSIS — N2581 Secondary hyperparathyroidism of renal origin: Secondary | ICD-10-CM | POA: Diagnosis present

## 2015-04-30 DIAGNOSIS — Z79899 Other long term (current) drug therapy: Secondary | ICD-10-CM

## 2015-04-30 DIAGNOSIS — R509 Fever, unspecified: Secondary | ICD-10-CM | POA: Diagnosis not present

## 2015-04-30 DIAGNOSIS — K31819 Angiodysplasia of stomach and duodenum without bleeding: Secondary | ICD-10-CM | POA: Diagnosis present

## 2015-04-30 DIAGNOSIS — D649 Anemia, unspecified: Secondary | ICD-10-CM | POA: Diagnosis not present

## 2015-04-30 DIAGNOSIS — E1122 Type 2 diabetes mellitus with diabetic chronic kidney disease: Secondary | ICD-10-CM | POA: Diagnosis present

## 2015-04-30 DIAGNOSIS — D62 Acute posthemorrhagic anemia: Secondary | ICD-10-CM | POA: Diagnosis present

## 2015-04-30 DIAGNOSIS — R591 Generalized enlarged lymph nodes: Secondary | ICD-10-CM | POA: Diagnosis not present

## 2015-04-30 DIAGNOSIS — K31811 Angiodysplasia of stomach and duodenum with bleeding: Secondary | ICD-10-CM | POA: Diagnosis not present

## 2015-04-30 DIAGNOSIS — R062 Wheezing: Secondary | ICD-10-CM | POA: Diagnosis not present

## 2015-04-30 DIAGNOSIS — F17210 Nicotine dependence, cigarettes, uncomplicated: Secondary | ICD-10-CM | POA: Diagnosis present

## 2015-04-30 DIAGNOSIS — E785 Hyperlipidemia, unspecified: Secondary | ICD-10-CM | POA: Diagnosis present

## 2015-04-30 DIAGNOSIS — K219 Gastro-esophageal reflux disease without esophagitis: Secondary | ICD-10-CM | POA: Diagnosis present

## 2015-04-30 DIAGNOSIS — E786 Lipoprotein deficiency: Secondary | ICD-10-CM | POA: Diagnosis not present

## 2015-04-30 DIAGNOSIS — D509 Iron deficiency anemia, unspecified: Secondary | ICD-10-CM | POA: Diagnosis not present

## 2015-04-30 DIAGNOSIS — K922 Gastrointestinal hemorrhage, unspecified: Secondary | ICD-10-CM | POA: Diagnosis not present

## 2015-04-30 DIAGNOSIS — R188 Other ascites: Secondary | ICD-10-CM | POA: Diagnosis not present

## 2015-04-30 DIAGNOSIS — R05 Cough: Secondary | ICD-10-CM | POA: Diagnosis not present

## 2015-04-30 DIAGNOSIS — E8770 Fluid overload, unspecified: Secondary | ICD-10-CM

## 2015-04-30 DIAGNOSIS — Q2733 Arteriovenous malformation of digestive system vessel: Secondary | ICD-10-CM | POA: Diagnosis not present

## 2015-04-30 DIAGNOSIS — Z720 Tobacco use: Secondary | ICD-10-CM | POA: Diagnosis not present

## 2015-04-30 LAB — CBC WITH DIFFERENTIAL/PLATELET
BASOS ABS: 0 10*3/uL (ref 0–0.1)
BASOS PCT: 1 %
EOS ABS: 0.1 10*3/uL (ref 0–0.7)
EOS PCT: 2 %
HCT: 18.6 % — ABNORMAL LOW (ref 40.0–52.0)
Hemoglobin: 5.7 g/dL — ABNORMAL LOW (ref 13.0–18.0)
LYMPHS PCT: 12 %
Lymphs Abs: 0.8 10*3/uL — ABNORMAL LOW (ref 1.0–3.6)
MCH: 29.5 pg (ref 26.0–34.0)
MCHC: 30.6 g/dL — ABNORMAL LOW (ref 32.0–36.0)
MCV: 96.6 fL (ref 80.0–100.0)
MONO ABS: 0.9 10*3/uL (ref 0.2–1.0)
Monocytes Relative: 14 %
Neutro Abs: 4.4 10*3/uL (ref 1.4–6.5)
Neutrophils Relative %: 71 %
PLATELETS: 319 10*3/uL (ref 150–440)
RBC: 1.93 MIL/uL — ABNORMAL LOW (ref 4.40–5.90)
RDW: 21.6 % — AB (ref 11.5–14.5)
WBC: 6.2 10*3/uL (ref 3.8–10.6)

## 2015-04-30 LAB — COMPREHENSIVE METABOLIC PANEL
ALBUMIN: 3 g/dL — AB (ref 3.5–5.0)
ALT: 19 U/L (ref 17–63)
AST: 26 U/L (ref 15–41)
Alkaline Phosphatase: 128 U/L — ABNORMAL HIGH (ref 38–126)
Anion gap: 5 (ref 5–15)
BILIRUBIN TOTAL: 0.6 mg/dL (ref 0.3–1.2)
BUN: 38 mg/dL — AB (ref 6–20)
CHLORIDE: 100 mmol/L — AB (ref 101–111)
CO2: 33 mmol/L — ABNORMAL HIGH (ref 22–32)
Calcium: 9.6 mg/dL (ref 8.9–10.3)
Creatinine, Ser: 4.91 mg/dL — ABNORMAL HIGH (ref 0.61–1.24)
GFR calc Af Amer: 14 mL/min — ABNORMAL LOW (ref >60)
GFR calc non Af Amer: 12 mL/min — ABNORMAL LOW (ref >60)
GLUCOSE: 109 mg/dL — AB (ref 65–99)
POTASSIUM: 4.1 mmol/L (ref 3.5–5.1)
Sodium: 138 mmol/L (ref 135–145)
Total Protein: 6.2 g/dL — ABNORMAL LOW (ref 6.5–8.1)

## 2015-04-30 LAB — PREPARE RBC (CROSSMATCH)

## 2015-04-30 LAB — PROTIME-INR
INR: 1.07
PROTHROMBIN TIME: 14.1 s (ref 11.4–15.0)

## 2015-04-30 LAB — APTT: APTT: 28 s (ref 24–36)

## 2015-04-30 MED ORDER — BENAZEPRIL HCL 20 MG PO TABS
10.0000 mg | ORAL_TABLET | Freq: Every day | ORAL | Status: DC
  Administered 2015-05-01 – 2015-05-08 (×5): 10 mg via ORAL
  Filled 2015-04-30 (×4): qty 1
  Filled 2015-04-30: qty 2
  Filled 2015-04-30: qty 1

## 2015-04-30 MED ORDER — ATORVASTATIN CALCIUM 20 MG PO TABS
40.0000 mg | ORAL_TABLET | Freq: Every day | ORAL | Status: DC
  Administered 2015-05-01 – 2015-05-08 (×6): 40 mg via ORAL
  Filled 2015-04-30 (×7): qty 2

## 2015-04-30 MED ORDER — IPRATROPIUM-ALBUTEROL 0.5-2.5 (3) MG/3ML IN SOLN
3.0000 mL | RESPIRATORY_TRACT | Status: DC
  Administered 2015-04-30 – 2015-05-01 (×4): 3 mL via RESPIRATORY_TRACT
  Filled 2015-04-30 (×4): qty 3

## 2015-04-30 MED ORDER — ADULT MULTIVITAMIN W/MINERALS CH
1.0000 | ORAL_TABLET | Freq: Every day | ORAL | Status: DC
  Administered 2015-05-01 – 2015-05-08 (×6): 1 via ORAL
  Filled 2015-04-30 (×8): qty 1

## 2015-04-30 MED ORDER — FUROSEMIDE 40 MG PO TABS
40.0000 mg | ORAL_TABLET | Freq: Every day | ORAL | Status: DC
  Administered 2015-05-01 – 2015-05-08 (×6): 40 mg via ORAL
  Filled 2015-04-30 (×7): qty 1

## 2015-04-30 MED ORDER — GABAPENTIN 300 MG PO CAPS
300.0000 mg | ORAL_CAPSULE | Freq: Every day | ORAL | Status: DC
  Administered 2015-04-30 – 2015-05-08 (×8): 300 mg via ORAL
  Filled 2015-04-30 (×8): qty 1

## 2015-04-30 MED ORDER — PANTOPRAZOLE SODIUM 40 MG IV SOLR
40.0000 mg | Freq: Two times a day (BID) | INTRAVENOUS | Status: DC
  Administered 2015-04-30 – 2015-05-06 (×12): 40 mg via INTRAVENOUS
  Filled 2015-04-30 (×12): qty 40

## 2015-04-30 MED ORDER — OXYCODONE-ACETAMINOPHEN 5-325 MG PO TABS
1.0000 | ORAL_TABLET | Freq: Four times a day (QID) | ORAL | Status: DC | PRN
  Administered 2015-04-30 – 2015-05-08 (×19): 1 via ORAL
  Filled 2015-04-30 (×20): qty 1

## 2015-04-30 MED ORDER — CALCIUM ACETATE 667 MG PO CAPS
2001.0000 mg | ORAL_CAPSULE | Freq: Three times a day (TID) | ORAL | Status: DC
  Administered 2015-05-01 – 2015-05-07 (×12): 2001 mg via ORAL
  Filled 2015-04-30 (×38): qty 3

## 2015-04-30 MED ORDER — LABETALOL HCL 200 MG PO TABS
200.0000 mg | ORAL_TABLET | Freq: Every day | ORAL | Status: DC
  Administered 2015-05-01 – 2015-05-08 (×5): 200 mg via ORAL
  Filled 2015-04-30 (×6): qty 1

## 2015-04-30 NOTE — ED Provider Notes (Addendum)
[facility] Emergency Department Provider Note  ____________________________________________   I have reviewed the triage vital signs and the nursing notes.   HISTORY  Chief Complaint GI Problem    HPI Stanley [name] is a 55 y.o. male presents today complaining of GI bleed. Patient states he has had a recurrent GI bleed off and on for several months. He states he has had extensive workup and the source. he has been getting progressively more weak over the last couple days. he states he does have black stools. he does have a history of cirrhosis. he states that he went to dialysis today and they noted he was anemic and told him to come to be admitted for blood transfusion and further workup. he has no abdominal pain he is not vomiting blood.  Past Medical History  Diagnosis Date  . Coronary artery disease   . Hypertension   . GERD (gastroesophageal reflux disease)   . Suicidal ideation     & HOMICIDAL IDEATION --  06-16-2013   ADMITTED TO BEHAVIOR HEALTH  . Alcohol abuse   . Drug abuse   . Diabetic peripheral neuropathy ([place])   . End stage renal disease on dialysis [facility]) NEPHROLOGIST-   DR [facility]  IN [place]    HEMODIALYSIS --   TUES/  THURS/  SAT  . Cirrhosis [facility])     Patient Active Problem List   Diagnosis Date Noted  . HTN (hypertension) 04/06/2015  . GERD (gastroesophageal reflux disease) 04/06/2015  . HLD (hyperlipidemia) 04/06/2015  . Dyspnea 04/06/2015  . Cirrhosis ([place]) 04/06/2015  . Ascites 04/06/2015  . GIB (gastrointestinal bleeding) 03/23/2015  . Homicidal ideation 06/19/2013  . Suicidal intent 06/19/2013  . Homicidal ideations 06/19/2013  . Hyperkalemia 06/16/2013  . Mandible fracture ([place]) 06/05/2013  . Fracture, mandible ([place]) 06/02/2013  . Coronary atherosclerosis of native coronary artery 06/02/2013  . ESRD on dialysis ([place]) 06/02/2013  . Mandible open fracture ([place]) 06/02/2013    Past Surgical History  Procedure  Laterality Date  . Dialysis fistula creation  LAST SURGERY  APPOX  2008  . Orif mandibular fracture N/A 06/05/2013    Procedure: REPAIR OF MANDIBULAR FRACTURE x 2 with maxillo-mandibular fixation ;  Surgeon: Theodoro Kos, DO;  Location: [place];  Service: Plastics;  Laterality: N/A;  . Coronary artery bypass graft  2008  ([place] , [place])    3 VESSEL  . Coronary angioplasty  ?    PT UNABLE TO TELL IF  BEFORE OR AFTER  CABG  . Mandibular hardware removal N/A 07/29/2013    Procedure: REMOVAL OF ARCH BARS;  Surgeon: Theodoro Kos, DO;  Location: [place];  Service: Plastics;  Laterality: N/A;    Current Outpatient Rx  Name  Route  Sig  Dispense  Refill  . amLODipine (NORVASC) 5 MG tablet   Oral   Take 5 mg by mouth daily.          Marland Kitchen aspirin 81 MG tablet   Oral   Take 81 mg by mouth daily.         Marland Kitchen atorvastatin (LIPITOR) 40 MG tablet   Oral   Take 40 mg by mouth daily.         . benazepril (LOTENSIN) 10 MG tablet   Oral   Take 10 mg by mouth daily.         . calcium acetate (PHOSLO) 667 MG capsule   Oral   Take 2,001 mg by mouth 3 (three) times  daily with meals.         . cinacalcet (SENSIPAR) 30 MG tablet   Oral   Take 1 tablet by mouth daily.         . furosemide (LASIX) 40 MG tablet   Oral   Take 1 tablet (40 mg total) by mouth daily.   30 tablet   0   . gabapentin (NEURONTIN) 300 MG capsule   Oral   Take 1 capsule (300 mg total) by mouth daily.   30 capsule   0   . labetalol (NORMODYNE) 200 MG tablet   Oral   Take 200 mg by mouth daily.          . Multiple Vitamins-Minerals (MULTIVITAMIN WITH MINERALS) tablet   Oral   Take 1 tablet by mouth daily.         . pantoprazole (PROTONIX) 40 MG tablet   Oral   Take 1 tablet (40 mg total) by mouth daily.   30 tablet   0     Allergies Review of patient's allergies indicates no known allergies.  Family History  Problem Relation Age of Onset  . Cancer Mother   . Cancer Father    . Cancer Sister     Social History Social History  Substance Use Topics  . Smoking status: Light Tobacco Smoker -- 0.00 packs/day for 40 years    Types: Cigarettes  . Smokeless tobacco: Never Used  . Alcohol Use: No    Review of Systems Constitutional: No fever/chills Eyes: No visual changes. ENT: No sore throat. No stiff neck no neck pain Cardiovascular: Denies chest pain. Respiratory: Denies shortness of breath. Gastrointestinal:   no vomiting.  No diarrhea.  No constipation. Genitourinary: Negative for dysuria. Musculoskeletal: Negative lower extremity swelling Skin: Negative for rash. Neurological: Negative for headaches, focal weakness or numbness. 10-point ROS otherwise negative.  ____________________________________________   PHYSICAL EXAM:  VITAL SIGNS: ED Triage Vitals  Enc Vitals Group     BP 04/30/15 1651 123/72 mmHg     Pulse Rate 04/30/15 1651 83     Resp 04/30/15 1651 18     Temp 04/30/15 1651 97.9 F (36.6 C)     Temp src --      SpO2 04/30/15 1651 99 %     Weight 04/30/15 1649 180 lb (81.647 kg)     Height 04/30/15 1649 6\' 3"  (1.905 m)     Head Cir --      Peak Flow --      Pain Score --      Pain Loc --      Pain Edu? --      Excl. in [place]? --     Constitutional: Alert and oriented. Somewhat chronically ill-appearing woman in no acute distress Eyes: Conjunctivae are pale and slightly icteric. PERRL. EOMI. Head: Atraumatic. Nose: No congestion/rhinnorhea. Mouth/Throat: Mucous membranes are moist.  Oropharynx non-erythematous. Neck: No stridor.   Nontender with no meningismus Cardiovascular: Normal rate, regular rhythm. Grossly normal heart sounds.  Good peripheral circulation. Respiratory: Normal respiratory effort.  No retractions. Lungs CTAB. Abdominal: Soft and nontender. No distention. No guarding no rebound, possible ascites noted Back:  There is no focal tenderness or step off there is no midline tenderness there are no lesions noted.  there is no CVA tenderness Rectal: Guaiac positive black stool noted Musculoskeletal: No lower extremity tenderness. No joint effusions, no DVT signs strong distal pulses no edema Neurologic:  Normal speech and language. No gross focal neurologic deficits  are appreciated.  Skin:  Skin is warm, dry and intact. No rash noted. Psychiatric: Mood and affect are normal. Speech and behavior are normal.  ____________________________________________   LABS (all labs ordered are listed, but only abnormal results are displayed)  Labs Reviewed  CBC WITH DIFFERENTIAL/PLATELET - Abnormal; Notable for the following:    RBC 1.93 (*)    Hemoglobin 5.7 (*)    HCT 18.6 (*)    MCHC 30.6 (*)    RDW 21.6 (*)    Lymphs Abs 0.8 (*)    All other components within normal limits  COMPREHENSIVE METABOLIC PANEL  PROTIME-INR  APTT  TYPE AND SCREEN   ____________________________________________  EKG  I personally interpreted any EKGs ordered by me or triage Normal sinus rhythm at 83 bpm no acute ST elevation or acute ST depression or ST changes normal axis ____________________________________________  RADIOLOGY  I reviewed any imaging ordered by me or triage that were performed during my shift ____________________________________________   PROCEDURES  Procedure(s) performed: None  Critical Care performed: CRITICAL CARE Performed by: Schuyler Amor   Total critical care time: 5 minutes  Critical care time was exclusive of separately billable procedures and treating other patients.  Critical care was necessary to treat or prevent imminent or life-threatening deterioration.  Critical care was time spent personally by me on the following activities: development of treatment plan with patient and/or surrogate as well as nursing, discussions with consultants, evaluation of patient's response to treatment, examination of patient, obtaining history from patient or surrogate, ordering and performing  treatments and interventions, ordering and review of laboratory studies, ordering and review of radiographic studies, pulse oximetry and re-evaluation of patient's condition.   ____________________________________________   INITIAL IMPRESSION / ASSESSMENT AND PLAN / ED COURSE  Pertinent labs & imaging results that were available during my care of the patient were reviewed by me and considered in my medical decision making (see chart for details).  Patient with a history of GI bleeding and black stool presents today with ongoing worsening weakness reassuring vital signs and an ongoing GI bleed with evidence of significant anemia. Likely will be admitted.  ----------------------------------------- 5:47 PM on 04/30/2015 -----------------------------------------  And is significantly anemic, acute on chronic, vital signs are stable however at this time he will need to be admitted. ____________________________________________   FINAL CLINICAL IMPRESSION(S) / ED DIAGNOSES  Final diagnoses:  Gastrointestinal hemorrhage, unspecified gastritis, unspecified gastrointestinal hemorrhage type      This chart was dictated using voice recognition software.  Despite best efforts to proofread,  errors can occur which can change meaning.     Schuyler Amor, MD 04/30/15 [place], MD 04/30/15 1747  Schuyler Amor, MD 04/30/15 [phone]

## 2015-04-30 NOTE — H&P (Signed)
[place] [facility] [place] NAME: Stanley [name]    MR#:  [id number]  DATE OF BIRTH:  [birth year]  DATE OF ADMISSION:  04/30/2015  PRIMARY CARE PHYSICIAN: Elyse Jarvis, MD   REQUESTING/REFERRING PHYSICIAN: Dr. Burlene Arnt  CHIEF COMPLAINT:   Chief Complaint  Patient presents with  . GI Problem    sent from dialysis for possible blood transfusion    HISTORY OF PRESENT ILLNESS: Stanley [name]  is a 55 y.o. male with a known history of coronary artery disease status post CABG, hypertension, gastroesophageal reflux disease, alcohol abuse and liver cirrhosis, end-stage renal disease on hemodialysis- he had GI bleed in the past and endoscopy and colonoscopy were done almost 8 months ago which were negative without any clear findings. He was also referred for TIPS in past by GI. For last few weeks had noted Dark stool. He went to dialysis today, they checked his hemoglobin and it was very low so Dr. Holley Raring told him to go to emergency room and her blood transfusion. ER order blood transfusion and called me for admission.   PAST MEDICAL HISTORY:   Past Medical History  Diagnosis Date  . Coronary artery disease   . Hypertension   . GERD (gastroesophageal reflux disease)   . Suicidal ideation     & HOMICIDAL IDEATION --  06-16-2013   ADMITTED TO BEHAVIOR HEALTH  . Alcohol abuse   . Drug abuse   . Diabetic peripheral neuropathy ([place])   . End stage renal disease on dialysis [facility]) NEPHROLOGIST-   DR [facility]  IN [place]    HEMODIALYSIS --   TUES/  THURS/  SAT  . Cirrhosis ([place])     PAST SURGICAL HISTORY: Past Surgical History  Procedure Laterality Date  . Dialysis fistula creation  LAST SURGERY  APPOX  2008  . Orif mandibular fracture N/A 06/05/2013    Procedure: REPAIR OF MANDIBULAR FRACTURE x 2 with maxillo-mandibular fixation ;  Surgeon: Theodoro Kos, DO;  Location: [place];  Service: Plastics;  Laterality: N/A;  . Coronary artery bypass graft   2008  ([place] , [place])    3 VESSEL  . Coronary angioplasty  ?    PT UNABLE TO TELL IF  BEFORE OR AFTER  CABG  . Mandibular hardware removal N/A 07/29/2013    Procedure: REMOVAL OF ARCH BARS;  Surgeon: Theodoro Kos, DO;  Location: [place];  Service: Plastics;  Laterality: N/A;    SOCIAL HISTORY:  Social History  Substance Use Topics  . Smoking status: Light Tobacco Smoker -- 0.00 packs/day for 40 years    Types: Cigarettes  . Smokeless tobacco: Never Used  . Alcohol Use: No    FAMILY HISTORY:  Family History  Problem Relation Age of Onset  . Cancer Mother   . Cancer Father   . Cancer Sister     DRUG ALLERGIES: No Known Allergies  REVIEW OF SYSTEMS:   CONSTITUTIONAL: No fever, fatigue or weakness.  EYES: No blurred or double vision.  EARS, NOSE, AND THROAT: No tinnitus or ear pain.  RESPIRATORY: No cough, shortness of breath, wheezing or hemoptysis.  CARDIOVASCULAR: No chest pain, orthopnea, edema.  GASTROINTESTINAL: No nausea, vomiting, diarrhea or abdominal pain. He has dark stool.  GENITOURINARY: No dysuria, hematuria.  ENDOCRINE: No polyuria, nocturia,  HEMATOLOGY: No anemia, easy bruising or bleeding SKIN: No rash or lesion. MUSCULOSKELETAL: No joint pain or arthritis.   NEUROLOGIC: No tingling, numbness,  generalize weakness.  PSYCHIATRY: No  anxiety or depression.   MEDICATIONS AT HOME:  Prior to Admission medications   Medication Sig Start Date End Date Taking? Authorizing Provider  aspirin 81 MG tablet Take 81 mg by mouth daily.   Yes Historical Provider, MD  atorvastatin (LIPITOR) 40 MG tablet Take 40 mg by mouth daily.   Yes Historical Provider, MD  benazepril (LOTENSIN) 10 MG tablet Take 10 mg by mouth daily.   Yes Historical Provider, MD  calcium acetate (PHOSLO) 667 MG capsule Take 2,001 mg by mouth 3 (three) times daily with meals.   Yes Historical Provider, MD  furosemide (LASIX) 40 MG tablet Take 1 tablet (40 mg total) by mouth daily.  04/07/15  Yes Bettey Costa, MD  gabapentin (NEURONTIN) 300 MG capsule Take 1 capsule (300 mg total) by mouth daily. 03/27/15  Yes Bettey Costa, MD  labetalol (NORMODYNE) 200 MG tablet Take 200 mg by mouth daily.    Yes Historical Provider, MD  Multiple Vitamins-Minerals (MULTIVITAMIN WITH MINERALS) tablet Take 1 tablet by mouth daily.   Yes Historical Provider, MD  pantoprazole (PROTONIX) 40 MG tablet Take 1 tablet (40 mg total) by mouth daily. 03/25/15  Yes Sital Mody, MD      PHYSICAL EXAMINATION:   VITAL SIGNS: Blood pressure 117/74, pulse 88, temperature 98.4 F (36.9 C), resp. rate 19, height 6\' 3"  (1.905 m), weight 81.647 kg (180 lb), SpO2 97 %.  GENERAL:  55 y.o.-year-old patient lying in the bed with no acute distress.  EYES: Pupils equal, round, reactive to light and accommodation. No scleral icterus. Extraocular muscles intact.     Conjunctive are pale.  HEENT: Head atraumatic, normocephalic. Oropharynx and nasopharynx clear.  NECK:  Supple, no jugular venous distention. No thyroid enlargement, no tenderness.  LUNGS: Normal breath sounds bilaterally, no wheezing, rales,rhonchi or crepitation. No use of accessory muscles of respiration.  CARDIOVASCULAR: S1, S2 normal. No murmurs, rubs, or gallops. Visible superficial veins on chest walls.  ABDOMEN: Soft, nontender, distended. Bowel sounds present. No organomegaly or mass.  EXTREMITIES: No pedal edema, cyanosis, or clubbing.  NEUROLOGIC: Cranial nerves II through XII are intact. Muscle strength 5/5 in all extremities. Sensation intact. Gait not checked.  PSYCHIATRIC: The patient is alert and oriented x 3.  SKIN: No obvious rash, lesion, or ulcer.   LABORATORY PANEL:   CBC  Recent Labs Lab 04/30/15 1712  WBC 6.2  HGB 5.7*  HCT 18.6*  PLT 319  MCV 96.6  MCH 29.5  MCHC 30.6*  RDW 21.6*  LYMPHSABS 0.8*  MONOABS 0.9  EOSABS 0.1  BASOSABS 0.0    ------------------------------------------------------------------------------------------------------------------  Chemistries  No results for input(s): NA, K, CL, CO2, GLUCOSE, BUN, CREATININE, CALCIUM, MG, AST, ALT, ALKPHOS, BILITOT in the last 168 hours.  Invalid input(s): GFRCGP ------------------------------------------------------------------------------------------------------------------ CrCl cannot be calculated (Patient has no serum creatinine result on file.). ------------------------------------------------------------------------------------------------------------------ No results for input(s): TSH, T4TOTAL, T3FREE, THYROIDAB in the last 72 hours.  Invalid input(s): FREET3   Coagulation profile  Recent Labs Lab 04/30/15 1703  INR 1.07   ------------------------------------------------------------------------------------------------------------------- No results for input(s): DDIMER in the last 72 hours. -------------------------------------------------------------------------------------------------------------------  Cardiac Enzymes No results for input(s): CKMB, TROPONINI, MYOGLOBIN in the last 168 hours.  Invalid input(s): CK ------------------------------------------------------------------------------------------------------------------ Invalid input(s): POCBNP  ---------------------------------------------------------------------------------------------------------------  Urinalysis    Component Value Date/Time   COLORURINE YELLOW 06/22/2014 1230   COLORURINE Yellow 11/13/2013 1314   APPEARANCEUR CLEAR 06/22/2014 1230   APPEARANCEUR Clear 11/13/2013 1314   LABSPEC 1.019 06/22/2014 1230   LABSPEC 1.014 11/13/2013 1314  PHURINE 8.0 06/22/2014 1230   PHURINE 6.0 11/13/2013 1314   GLUCOSEU NEGATIVE 06/22/2014 1230   GLUCOSEU 50 mg/dL 11/13/2013 1314   HGBUR SMALL* 06/22/2014 1230   HGBUR 1+ 11/13/2013 1314   BILIRUBINUR NEGATIVE 06/22/2014 1230    BILIRUBINUR Negative 11/13/2013 1314   KETONESUR NEGATIVE 06/22/2014 1230   KETONESUR Negative 11/13/2013 1314   PROTEINUR >300* 06/22/2014 1230   PROTEINUR >=500 11/13/2013 1314   UROBILINOGEN 0.2 06/22/2014 1230   NITRITE NEGATIVE 06/22/2014 1230   NITRITE Negative 11/13/2013 1314   LEUKOCYTESUR NEGATIVE 06/22/2014 1230   LEUKOCYTESUR Negative 11/13/2013 1314     RADIOLOGY: No results found.  EKG: Orders placed or performed during the hospital encounter of 04/05/15  . ED EKG  . ED EKG  . EKG 12-Lead  . EKG 12-Lead  . ED EKG  . ED EKG    IMPRESSION AND PLAN:  * Symptomatic anemia secondary to acute bleed due to GI bleeding   Hemoglobin is 5.7   2 units blood transfusion ordered by ER.   We'll follow her hemoglobin tomorrow.   IV Protonix twice a day.   GI consult for further management.   He was seen by GI in the past, had colonoscopy and endoscopy was also done. And he was referred for TIPS.  * History of renal disease on hemodialysis  He finished his dialysis today, currently not in fluid overload.   Nephrology consult to monitor in hospital.  * Coronary artery disease status post CABG   Stop aspirin because of GI bleed.   Continue other medications.  *  Hypertension   Continue furosemide, labetalol, benazepril.  * Hyperlipidemia   Continue atorvastatin.  * Smoking   Counseled to quit smoking for 4 minutes. Offered nicotine patch.  All the records are reviewed and case discussed with ED provider. Management plans discussed with the patient, family and they are in agreement.  CODE STATUS: Code Status History    Date Active Date Inactive Code Status Order ID Comments User Context   04/06/2015  5:07 AM 04/07/2015  5:36 PM Full Code [id number]  Lance Coon, MD Inpatient   03/23/2015 11:07 AM 03/27/2015 12:46 PM Full Code [id number]  Bettey Costa, MD Inpatient   06/19/2013 11:32 AM 06/21/2013  5:49 PM Full Code [id number]  Cristal Ford, DO Inpatient   06/16/2013   5:26 PM 06/19/2013 11:32 AM Full Code [id number]  Margarita Mail, PA-C ED   06/05/2013  3:33 PM 06/06/2013  6:56 PM Full Code [id number]  Theodoro Kos, DO Inpatient   06/02/2013  9:01 AM 06/05/2013  3:33 PM Full Code [id number]  Velvet Bathe, MD Inpatient   06/02/2013  8:40 AM 06/02/2013  9:01 AM Full Code [id number]  Velvet Bathe, MD Inpatient       TOTAL TIME TAKING CARE OF THIS PATIENT: 55 minutes.    Vaughan Basta M.D on 04/30/2015   Between 7am to 6pm - Pager - [phone]  After 6pm go to [email or web address] - password [email or web address] [facility]  Office  [phone]  CC: Primary care physician; Elyse Jarvis, MD   Note: This dictation was prepared with Dragon dictation along with smaller phrase technology. Any transcriptional errors that result from this process are unintentional.

## 2015-04-30 NOTE — ED Notes (Signed)
Per EMS patient comes from dialysis today. They told him to come here for a possible blood transfusion. Dialysis staff was concerned for a potential GI bleed. Patient is A&O x4. Patient denies pain. BP 124/64.

## 2015-05-01 LAB — BASIC METABOLIC PANEL
Anion gap: 9 (ref 5–15)
BUN: 43 mg/dL — AB (ref 6–20)
CO2: 29 mmol/L (ref 22–32)
CREATININE: 5.47 mg/dL — AB (ref 0.61–1.24)
Calcium: 9.9 mg/dL (ref 8.9–10.3)
Chloride: 100 mmol/L — ABNORMAL LOW (ref 101–111)
GFR calc Af Amer: 12 mL/min — ABNORMAL LOW (ref >60)
GFR, EST NON AFRICAN AMERICAN: 11 mL/min — AB (ref >60)
GLUCOSE: 75 mg/dL (ref 65–99)
Potassium: 4.4 mmol/L (ref 3.5–5.1)
SODIUM: 138 mmol/L (ref 135–145)

## 2015-05-01 LAB — CBC
HCT: 22.8 % — ABNORMAL LOW (ref 40.0–52.0)
HCT: 22.8 % — ABNORMAL LOW (ref 40.0–52.0)
Hemoglobin: 7.6 g/dL — ABNORMAL LOW (ref 13.0–18.0)
Hemoglobin: 7.2 g/dL — ABNORMAL LOW (ref 13.0–18.0)
MCH: 31.3 pg (ref 26.0–34.0)
MCH: 29.4 pg (ref 26.0–34.0)
MCHC: 33.6 g/dL (ref 32.0–36.0)
MCHC: 31.7 g/dL — ABNORMAL LOW (ref 32.0–36.0)
MCV: 93.3 fL (ref 80.0–100.0)
MCV: 92.6 fL (ref 80.0–100.0)
PLATELETS: 307 10*3/uL (ref 150–440)
PLATELETS: 295 10*3/uL (ref 150–440)
RBC: 2.44 MIL/uL — ABNORMAL LOW (ref 4.40–5.90)
RBC: 2.46 MIL/uL — AB (ref 4.40–5.90)
RDW: 20.1 % — AB (ref 11.5–14.5)
RDW: 20.4 % — AB (ref 11.5–14.5)
WBC: 6.6 10*3/uL (ref 3.8–10.6)
WBC: 6.4 10*3/uL (ref 3.8–10.6)

## 2015-05-01 LAB — TYPE AND SCREEN
ABO/RH(D): A POS
ANTIBODY SCREEN: NEGATIVE
UNIT DIVISION: 0
Unit division: 0

## 2015-05-01 LAB — MRSA PCR SCREENING: MRSA by PCR: NEGATIVE

## 2015-05-01 MED ORDER — IPRATROPIUM-ALBUTEROL 0.5-2.5 (3) MG/3ML IN SOLN
3.0000 mL | RESPIRATORY_TRACT | Status: DC | PRN
  Administered 2015-05-04 – 2015-05-08 (×4): 3 mL via RESPIRATORY_TRACT
  Filled 2015-05-01 (×4): qty 3

## 2015-05-01 NOTE — Progress Notes (Signed)
[facility]  ROUNDING NOTE   Subjective:   Admitted yesterday for severe anemia. Hemoglobin of 5.7. 2 units PRBC given.  Hemodialysis yesterday as outpatient.   Objective:  Vital signs in last 24 hours:  Temp:  [97.9 F (36.6 C)-98.6 F (37 C)] 98.2 F (36.8 C) (02/17 0434) Pulse Rate:  [82-92] 85 (02/17 0937) Resp:  [12-20] 20 (02/17 0434) BP: (117-135)/(62-80) 130/80 mmHg (02/17 0937) SpO2:  [92 %-100 %] 98 % (02/17 0731) Weight:  [80.015 kg (176 lb 6.4 oz)-81.647 kg (180 lb)] 80.015 kg (176 lb 6.4 oz) (02/16 2009)  Weight change:  Filed Weights   04/30/15 1649 04/30/15 2009  Weight: 81.647 kg (180 lb) 80.015 kg (176 lb 6.4 oz)    Intake/Output: I/O last 3 completed shifts: In: [id number] [P.O.:118; I.V.:65; Blood:750] Out: 200 [Urine:200]   Intake/Output this shift:  Total I/O In: 750 [P.O.:750] Out: 200 [Urine:200]  Physical Exam: General: NAD,   Head: Normocephalic, atraumatic. Moist oral mucosal membranes  Eyes: Anicteric, PERRL  Neck: Supple, trachea midline  Lungs:  Clear to auscultation  Heart: Regular rate and rhythm  Abdomen:  Soft, nontender, distended  Extremities:  trace peripheral edema.  Neurologic: Nonfocal, moving all four extremities  Skin: No lesions  Access: Right arm AVF    Basic Metabolic Panel:  Recent Labs Lab 04/30/15 1825 05/01/15 0341  NA 138 138  K 4.1 4.4  CL 100* 100*  CO2 33* 29  GLUCOSE 109* 75  BUN 38* 43*  CREATININE 4.91* 5.47*  CALCIUM 9.6 9.9    Liver Function Tests:  Recent Labs Lab 04/30/15 1825  AST 26  ALT 19  ALKPHOS 128*  BILITOT 0.6  PROT 6.2*  ALBUMIN 3.0*   No results for input(s): LIPASE, AMYLASE in the last 168 hours. No results for input(s): AMMONIA in the last 168 hours.  CBC:  Recent Labs Lab 04/30/15 1712 05/01/15 0341 05/01/15 0959  WBC 6.2 6.6 6.4  NEUTROABS 4.4  --   --   HGB 5.7* 7.6* 7.2*  HCT 18.6* 22.8* 22.8*  MCV 96.6 93.3 92.6  PLT 319 307 295    Cardiac  Enzymes: No results for input(s): CKTOTAL, CKMB, CKMBINDEX, TROPONINI in the last 168 hours.  BNP: Invalid input(s): POCBNP  CBG: No results for input(s): GLUCAP in the last 168 hours.  Microbiology: Results for orders placed or performed during the hospital encounter of 04/30/15  MRSA PCR Screening     Status: None   Collection Time: 05/01/15  1:36 AM  Result Value Ref Range Status   MRSA by PCR NEGATIVE NEGATIVE Final    Comment:        The GeneXpert MRSA Assay (FDA approved for NASAL specimens only), is one component of a comprehensive MRSA colonization surveillance program. It is not intended to diagnose MRSA infection nor to guide or monitor treatment for MRSA infections.     Coagulation Studies:  Recent Labs  04/30/15 1703  LABPROT 14.1  INR 1.07    Urinalysis: No results for input(s): COLORURINE, LABSPEC, PHURINE, GLUCOSEU, HGBUR, BILIRUBINUR, KETONESUR, PROTEINUR, UROBILINOGEN, NITRITE, LEUKOCYTESUR in the last 72 hours.  Invalid input(s): APPERANCEUR    Imaging: No results found.   Medications:     . atorvastatin  40 mg Oral Daily  . benazepril  10 mg Oral Daily  . calcium acetate  2,001 mg Oral TID WC  . furosemide  40 mg Oral Daily  . gabapentin  300 mg Oral Daily  . ipratropium-albuterol  3  mL Nebulization Q4H  . labetalol  200 mg Oral Daily  . multivitamin with minerals  1 tablet Oral Daily  . pantoprazole (PROTONIX) IV  40 mg Intravenous Q12H   oxyCODONE-acetaminophen  Assessment/ Plan:  Mr. Tarvaris Sly is a 55 y.o. black male with end-stage renal disease, dialysis at [facility], coronary disease, hypertension, history of alcohol abuse, history of drug abuse  [facility] [address] TTS  1. End-stage renal disease: hemodialysis treatment yesterday as outpatient. Plan for next treatment tomorrow.  - continue TTS schedule.   2. Anemia of chronic kidney disease: hemoglobin 5.7 on admission. Recent GI bleed - epo with  HD treatment. - Appreciate GI input   3. Secondary hyperparathyroidism: outpatient PTH 181, phos 7.2 and calcium 11.7 . Hectorol with treatments.  - calcium acetate 3 tabs with meals - currently off cinacalcet.  - will discuss with outpatient dietician overall plan  4. Ascites from liver cirrhosis: status post paracentesis on 04/06/15. 4 litres removed. Currently without much ascites.   5. Hypertension: well controlled.  - amlodipine, benazepril, labetolol   LOS: [place], Brenlynn Fake 2/17/201711:19 AM

## 2015-05-01 NOTE — Progress Notes (Signed)
[place] [facility] [place] NAME: Stanley [name]    MR#:  [id number]  DATE OF BIRTH:  1960-03-21  SUBJECTIVE:  Received 2 unit packed red Blood cell transfusion Complains of weakness, being hungry, continuous melena  REVIEW OF SYSTEMS:  CONSTITUTIONAL: No fever, positive fatigue and weakness.  EYES: No blurred or double vision.  EARS, NOSE, AND THROAT: No tinnitus or ear pain.  RESPIRATORY: No cough, shortness of breath, wheezing or hemoptysis.  CARDIOVASCULAR: No chest pain, orthopnea, edema.  GASTROINTESTINAL: No nausea, vomiting, diarrhea or abdominal pain.  GENITOURINARY: No dysuria, hematuria.  ENDOCRINE: No polyuria, nocturia,  HEMATOLOGY: No anemia, easy bruising or bleeding SKIN: No rash or lesion. MUSCULOSKELETAL: No joint pain or arthritis.   NEUROLOGIC: No tingling, numbness, weakness.  PSYCHIATRY: No anxiety or depression.   DRUG ALLERGIES:  No Known Allergies  VITALS:  Blood pressure 130/80, pulse 85, temperature 98.2 F (36.8 C), temperature source Oral, resp. rate 20, height 6\' 3"  (1.905 m), weight 80.015 kg (176 lb 6.4 oz), SpO2 98 %.  PHYSICAL EXAMINATION:  VITAL SIGNS: Filed Vitals:   05/01/15 0434 05/01/15 0937  BP: 121/76 130/80  Pulse: 83 85  Temp: 98.2 F (36.8 C)   Resp: 61    GENERAL:55 y.o.male currently in no acute distress.  HEAD: Normocephalic, atraumatic.  EYES: Pupils equal, round, reactive to light. Extraocular muscles intact. No scleral icterus.  MOUTH: Moist mucosal membrane. Dentition intact. No abscess noted.  EAR, NOSE, THROAT: Clear without exudates. No external lesions.  NECK: Supple. No thyromegaly. No nodules. No JVD.  PULMONARY: Clear to ascultation, without wheeze rails or rhonci. No use of accessory muscles, Good respiratory effort. good air entry bilaterally CHEST: Nontender to palpation.  CARDIOVASCULAR: S1 and S2. Regular rate and rhythm. No murmurs, rubs, or gallops. No edema.  Pedal pulses 2+ bilaterally.  GASTROINTESTINAL: Soft, nontender, nondistended. No masses. Positive bowel sounds. No hepatosplenomegaly.  MUSCULOSKELETAL: No swelling, clubbing, or edema. Range of motion full in all extremities.  NEUROLOGIC: Cranial nerves II through XII are intact. No gross focal neurological deficits. Sensation intact. Reflexes intact.  SKIN: No ulceration, lesions, rashes, or cyanosis. Skin warm and dry. Turgor intact.  PSYCHIATRIC: Mood, affect within normal limits. The patient is awake, alert and oriented x 3. Insight, judgment intact.      LABORATORY PANEL:   CBC  Recent Labs Lab 05/01/15 0959  WBC 6.4  HGB 7.2*  HCT 22.8*  PLT 295   ------------------------------------------------------------------------------------------------------------------  Chemistries   Recent Labs Lab 04/30/15 1825 05/01/15 0341  NA 138 138  K 4.1 4.4  CL 100* 100*  CO2 33* 29  GLUCOSE 109* 75  BUN 38* 43*  CREATININE 4.91* 5.47*  CALCIUM 9.6 9.9  AST 26  --   ALT 19  --   ALKPHOS 128*  --   BILITOT 0.6  --    ------------------------------------------------------------------------------------------------------------------  Cardiac Enzymes No results for input(s): TROPONINI in the last 168 hours. ------------------------------------------------------------------------------------------------------------------  RADIOLOGY:  No results found.  EKG:   Orders placed or performed during the hospital encounter of 04/05/15  . ED EKG  . ED EKG  . EKG 12-Lead  . EKG 12-Lead  . ED EKG  . ED EKG    ASSESSMENT AND PLAN:   55 year old gentleman history end-stage renal disease on hemodialysis presenting with anemia secondary to GI bleed  1. Symptomatic anemia secondary to acute on chronic upper GI bleed: Status post 2 unit transfusion, follow-up CBC responded appropriately,  GI consult pending, PPI therapy 2. [name]: Nephrology consult  appreciated 3. Coronary artery disease status post bypass no angina: Aspirin on hold 4. Essential hypertension: Lasix labetalol Benzapril 5. Hyperlipidemia unspecified: Statin therapy 6. Venous thromboembolism prophylactic: SCDs       All the records are reviewed and case discussed with Care Management/Social Workerr. Management plans discussed with the patient, family and they are in agreement.  CODE STATUS: Full  TOTAL TIME TAKING CARE OF THIS PATIENT: 31 minutes.   POSSIBLE D/C IN 1-2 DAYS, DEPENDING ON CLINICAL CONDITION.   Hower,  Karenann Cai.D on 05/01/2015 at 12:41 PM  Between 7am to 6pm - Pager - [phone]  After 6pm: House Pager: - [phone]  [facility]  Office  [phone]  CC: Primary care physician; Elyse Jarvis, MD

## 2015-05-01 NOTE — Care Management (Signed)
Patient presents from home from his dialysis center for concerns for gi bleed.  Uses [facility] and uses [facility] for transportation .  There are no concerns regarding compliance reported.  He is current with his PCP. Has required transfusion of 2 units.  GI has consulted and no need to repeat colonoscopy but EGD may be indicated

## 2015-05-01 NOTE — Consult Note (Signed)
GI Inpatient Consult Note  Reason for Consult: Symptomatic anemia.   Attending Requesting Consult:  History of Present Illness: Stanley [name] is a 55 y.o. male with CAD, ESRD on HD q Tues, Thurs, and Sat, who was sent from HD yesterday due to significant anemia. Has been feeling weak and tired recently. Sees dark stools chronically. No abdominal pain. Had colon polyp in 03/2014 and normal EGD then. Seen by Dr. Gustavo Lah last month for hematemesis. Felt too unstable then to undergo EGD at the time. Abdominal swelling and SOB better now that pt is on HD and on lasix. Pt on protonix. Received 2 units of PRBC last night. Feels better.  Past Medical History:  Past Medical History  Diagnosis Date  . Coronary artery disease   . Hypertension   . GERD (gastroesophageal reflux disease)   . Suicidal ideation     & HOMICIDAL IDEATION --  06-16-2013   ADMITTED TO BEHAVIOR HEALTH  . Alcohol abuse   . Drug abuse   . Diabetic peripheral neuropathy ([place])   . End stage renal disease on dialysis [facility]) NEPHROLOGIST-   DR [facility]  IN [place]    HEMODIALYSIS --   TUES/  THURS/  SAT  . Cirrhosis ([place])     Problem List: Patient Active Problem List   Diagnosis Date Noted  . Symptomatic anemia 04/30/2015  . HTN (hypertension) 04/06/2015  . GERD (gastroesophageal reflux disease) 04/06/2015  . HLD (hyperlipidemia) 04/06/2015  . Dyspnea 04/06/2015  . Cirrhosis ([place]) 04/06/2015  . Ascites 04/06/2015  . GIB (gastrointestinal bleeding) 03/23/2015  . Homicidal ideation 06/19/2013  . Suicidal intent 06/19/2013  . Homicidal ideations 06/19/2013  . Hyperkalemia 06/16/2013  . Mandible fracture ([place]) 06/05/2013  . Fracture, mandible ([place]) 06/02/2013  . Coronary atherosclerosis of native coronary artery 06/02/2013  . ESRD on dialysis ([place]) 06/02/2013  . Mandible open fracture ([place]) 06/02/2013    Past Surgical History: Past Surgical History  Procedure Laterality Date  . Dialysis fistula creation  LAST  SURGERY  APPOX  2008  . Orif mandibular fracture N/A 06/05/2013    Procedure: REPAIR OF MANDIBULAR FRACTURE x 2 with maxillo-mandibular fixation ;  Surgeon: Theodoro Kos, DO;  Location: [place];  Service: Plastics;  Laterality: N/A;  . Coronary artery bypass graft  2008  ([place] , [place])    3 VESSEL  . Coronary angioplasty  ?    PT UNABLE TO TELL IF  BEFORE OR AFTER  CABG  . Mandibular hardware removal N/A 07/29/2013    Procedure: REMOVAL OF ARCH BARS;  Surgeon: Theodoro Kos, DO;  Location: [place] [facility];  Service: Plastics;  Laterality: N/A;    Allergies: No Known Allergies  Home Medications: Prescriptions prior to admission  Medication Sig Dispense Refill Last Dose  . aspirin 81 MG tablet Take 81 mg by mouth daily.   04/29/2015 at am  . atorvastatin (LIPITOR) 40 MG tablet Take 40 mg by mouth daily.   04/29/2015 at Unknown time  . benazepril (LOTENSIN) 10 MG tablet Take 10 mg by mouth daily.   04/29/2015 at Unknown time  . calcium acetate (PHOSLO) 667 MG capsule Take 2,001 mg by mouth 3 (three) times daily with meals.   04/29/2015 at Unknown time  . furosemide (LASIX) 40 MG tablet Take 1 tablet (40 mg total) by mouth daily. 30 tablet 0 04/29/2015 at Unknown time  . gabapentin (NEURONTIN) 300 MG capsule Take 1 capsule (300 mg total) by mouth daily. 30 capsule 0 04/29/2015 at Unknown  time  . labetalol (NORMODYNE) 200 MG tablet Take 200 mg by mouth daily.    04/29/2015 at Unknown time  . Multiple Vitamins-Minerals (MULTIVITAMIN WITH MINERALS) tablet Take 1 tablet by mouth daily.   04/29/2015 at Unknown time  . pantoprazole (PROTONIX) 40 MG tablet Take 1 tablet (40 mg total) by mouth daily. 30 tablet 0 04/29/2015 at Unknown time   Home medication reconciliation was completed with the patient.   Scheduled Inpatient Medications:   . atorvastatin  40 mg Oral Daily  . benazepril  10 mg Oral Daily  . calcium acetate  2,001 mg Oral TID WC  . furosemide  40 mg Oral Daily  . gabapentin  300 mg  Oral Daily  . ipratropium-albuterol  3 mL Nebulization Q4H  . labetalol  200 mg Oral Daily  . multivitamin with minerals  1 tablet Oral Daily  . pantoprazole (PROTONIX) IV  40 mg Intravenous Q12H    Continuous Inpatient Infusions:     PRN Inpatient Medications:  oxyCODONE-acetaminophen  Family History: family history includes Cancer in his father, mother, and sister.  The patient's family history is negative for inflammatory bowel disorders, GI malignancy, or solid organ transplantation.  Social History:   reports that he has been smoking Cigarettes.  He has been smoking about 0.00 packs per day for the past 40 years. He has never used smokeless tobacco. He reports that he does not drink alcohol or use illicit drugs. The patient denies ETOH, tobacco, or drug use.   Review of Systems: Constitutional: Weight is stable.  Eyes: No changes in vision. ENT: No oral lesions, sore throat.  GI: see HPI.  Heme/Lymph: No easy bruising.  CV: No chest pain.  GU: No hematuria.  Integumentary: No rashes.  Neuro: No headaches.  Psych: No depression/anxiety.  Endocrine: No heat/cold intolerance.  Allergic/Immunologic: No urticaria.  Resp: No cough, SOB.  Musculoskeletal: No joint swelling.    Physical Examination: BP 103/64 mmHg  Pulse 68  Temp(Src) 97.7 F (36.5 C) (Oral)  Resp 16  Ht 6\' 3"  (1.905 m)  Wt 80.015 kg (176 lb 6.4 oz)  BMI 22.05 kg/m2  SpO2 99% Gen: NAD, alert and oriented x 4 HEENT: PEERLA, EOMI, Neck: supple, no JVD or thyromegaly Chest: CTA bilaterally, no wheezes, crackles, or other adventitious sounds CV: RRR, no m/g/c/r Abd: soft, NT, mild distension, +BS in all four quadrants; no HSM, guarding, ridigity, or rebound tenderness Ext: no edema, well perfused with 2+ pulses, Skin: no rash or lesions noted Lymph: no LAD  Data: Lab Results  Component Value Date   WBC 6.4 05/01/2015   HGB 7.2* 05/01/2015   HCT 22.8* 05/01/2015   MCV 92.6 05/01/2015   PLT 295  05/01/2015    Recent Labs Lab 04/30/15 1712 05/01/15 0341 05/01/15 0959  HGB 5.7* 7.6* 7.2*   Lab Results  Component Value Date   NA 138 05/01/2015   K 4.4 05/01/2015   CL 100* 05/01/2015   CO2 29 05/01/2015   BUN 43* 05/01/2015   CREATININE 5.47* 05/01/2015   Lab Results  Component Value Date   ALT 19 04/30/2015   AST 26 04/30/2015   ALKPHOS 128* 04/30/2015   BILITOT 0.6 04/30/2015    Recent Labs Lab 04/30/15 1703  APTT 28  INR 1.07   Assessment/Plan: Mr. Drath is a 55 y.o. male with significant anemia. Multifactorial. Some possible blood loss but anemia of chronic disease related to his renal failure.  Recommendations: No need to repeat colonoscopy. HD  tomorrow. If pt requires further stay, then will consider EGD on Monday. Please hold ASA. Will follow Thanks.  Thank you for the consult. Please call with questions or concerns.  Apple Dearmas, Lupita Dawn, MD

## 2015-05-02 LAB — RENAL FUNCTION PANEL
ANION GAP: 11 (ref 5–15)
Albumin: 2.9 g/dL — ABNORMAL LOW (ref 3.5–5.0)
BUN: 56 mg/dL — ABNORMAL HIGH (ref 6–20)
CHLORIDE: 99 mmol/L — AB (ref 101–111)
CO2: 24 mmol/L (ref 22–32)
Calcium: 10.1 mg/dL (ref 8.9–10.3)
Creatinine, Ser: 7.38 mg/dL — ABNORMAL HIGH (ref 0.61–1.24)
GFR calc Af Amer: 9 mL/min — ABNORMAL LOW (ref >60)
GFR calc non Af Amer: 7 mL/min — ABNORMAL LOW (ref >60)
GLUCOSE: 108 mg/dL — AB (ref 65–99)
POTASSIUM: 4.9 mmol/L (ref 3.5–5.1)
Phosphorus: 6.9 mg/dL — ABNORMAL HIGH (ref 2.5–4.6)
Sodium: 134 mmol/L — ABNORMAL LOW (ref 135–145)

## 2015-05-02 LAB — CBC
HEMATOCRIT: 22.7 % — AB (ref 40.0–52.0)
HEMOGLOBIN: 7.3 g/dL — AB (ref 13.0–18.0)
MCH: 29.5 pg (ref 26.0–34.0)
MCHC: 32.1 g/dL (ref 32.0–36.0)
MCV: 91.9 fL (ref 80.0–100.0)
Platelets: 290 10*3/uL (ref 150–440)
RBC: 2.47 MIL/uL — AB (ref 4.40–5.90)
RDW: 19.8 % — ABNORMAL HIGH (ref 11.5–14.5)
WBC: 6.5 10*3/uL (ref 3.8–10.6)

## 2015-05-02 MED ORDER — EPOETIN ALFA 10000 UNIT/ML IJ SOLN
10000.0000 [IU] | Freq: Once | INTRAMUSCULAR | Status: AC
  Administered 2015-05-02: 10000 [IU] via INTRAVENOUS

## 2015-05-02 MED ORDER — OXYCODONE-ACETAMINOPHEN 7.5-325 MG PO TABS
1.0000 | ORAL_TABLET | Freq: Once | ORAL | Status: AC
  Administered 2015-05-02: 1 via ORAL
  Filled 2015-05-02 (×2): qty 1

## 2015-05-02 NOTE — Progress Notes (Signed)
[facility]  ROUNDING NOTE   Subjective:   Colonoscopy on 1/29. EGD scheduled for Monday.   Seen and examined on hemodialysis. Tolerating treatment well.   Objective:  Vital signs in last 24 hours:  Temp:  [97.7 F (36.5 C)-98.2 F (36.8 C)] 98.2 F (36.8 C) (02/18 0558) Pulse Rate:  [68-77] 77 (02/18 0558) Resp:  [16-18] 18 (02/17 2031) BP: (103-121)/(64-73) 104/67 mmHg (02/18 0558) SpO2:  [99 %-100 %] 100 % (02/18 0558)  Weight change:  Filed Weights   04/30/15 1649 04/30/15 2009  Weight: 81.647 kg (180 lb) 80.015 kg (176 lb 6.4 oz)    Intake/Output: I/O last 3 completed shifts: In: 2913 [P.O.:2098; I.V.:65; Blood:750] Out: 950 [Urine:950]   Intake/Output this shift:  Total I/O In: 480 [P.O.:480] Out: 100 [Urine:100]  Physical Exam: General: NAD,   Head: Normocephalic, atraumatic. Moist oral mucosal membranes  Eyes: Anicteric, PERRL  Neck: Supple, trachea midline  Lungs:  Clear to auscultation  Heart: Regular rate and rhythm  Abdomen:  Soft, nontender, distended  Extremities:  trace peripheral edema.  Neurologic: Nonfocal, moving all four extremities  Skin: No lesions  Access: Right arm AVF    Basic Metabolic Panel:  Recent Labs Lab 04/30/15 1825 05/01/15 0341  NA 138 138  K 4.1 4.4  CL 100* 100*  CO2 33* 29  GLUCOSE 109* 75  BUN 38* 43*  CREATININE 4.91* 5.47*  CALCIUM 9.6 9.9    Liver Function Tests:  Recent Labs Lab 04/30/15 1825  AST 26  ALT 19  ALKPHOS 128*  BILITOT 0.6  PROT 6.2*  ALBUMIN 3.0*   No results for input(s): LIPASE, AMYLASE in the last 168 hours. No results for input(s): AMMONIA in the last 168 hours.  CBC:  Recent Labs Lab 04/30/15 1712 05/01/15 0341 05/01/15 0959  WBC 6.2 6.6 6.4  NEUTROABS 4.4  --   --   HGB 5.7* 7.6* 7.2*  HCT 18.6* 22.8* 22.8*  MCV 96.6 93.3 92.6  PLT 319 307 295    Cardiac Enzymes: No results for input(s): CKTOTAL, CKMB, CKMBINDEX, TROPONINI in the last 168  hours.  BNP: Invalid input(s): POCBNP  CBG: No results for input(s): GLUCAP in the last 168 hours.  Microbiology: Results for orders placed or performed during the hospital encounter of 04/30/15  MRSA PCR Screening     Status: None   Collection Time: 05/01/15  1:36 AM  Result Value Ref Range Status   MRSA by PCR NEGATIVE NEGATIVE Final    Comment:        The GeneXpert MRSA Assay (FDA approved for NASAL specimens only), is one component of a comprehensive MRSA colonization surveillance program. It is not intended to diagnose MRSA infection nor to guide or monitor treatment for MRSA infections.     Coagulation Studies:  Recent Labs  04/30/15 1703  LABPROT 14.1  INR 1.07    Urinalysis: No results for input(s): COLORURINE, LABSPEC, PHURINE, GLUCOSEU, HGBUR, BILIRUBINUR, KETONESUR, PROTEINUR, UROBILINOGEN, NITRITE, LEUKOCYTESUR in the last 72 hours.  Invalid input(s): APPERANCEUR    Imaging: No results found.   Medications:     . atorvastatin  40 mg Oral Daily  . benazepril  10 mg Oral Daily  . calcium acetate  2,001 mg Oral TID WC  . furosemide  40 mg Oral Daily  . gabapentin  300 mg Oral Daily  . labetalol  200 mg Oral Daily  . multivitamin with minerals  1 tablet Oral Daily  . pantoprazole (PROTONIX) IV  40 mg Intravenous Q12H   ipratropium-albuterol, oxyCODONE-acetaminophen  Assessment/ Plan:  Stanley [name] is a 55 y.o. black male with end-stage renal disease, dialysis at [facility], coronary disease, hypertension, history of alcohol abuse, history of drug abuse  [facility]  1. End-stage renal disease:seen and examined on hemodialysis treatment.  - continue TTS schedule.   2. Anemia of chronic kidney disease: hemoglobin 5.7 on admission. Recent GI bleed. Status post 2 units PRBC on 2/16.  - epo with HD treatment. - Appreciate GI input: EGD for Monday  3. Secondary hyperparathyroidism: outpatient PTH 181, phos  7.2. Hectorol with outpatient treatments.  - calcium acetate 3 tabs with meals. Calcium level at goal.  - currently off cinacalcet.  - pending phosphorus level.   4. Ascites from liver cirrhosis: status post paracentesis on 04/06/15. 4 litres removed. Currently without much ascites.   5. Hypertension: well controlled.  - amlodipine, benazepril, labetolol   LOS: 2 Fannie Gathright 2/18/201710:48 AM

## 2015-05-02 NOTE — Progress Notes (Signed)
[place] at [place] NAME: Kalobe Batman    MR#:  [id number]  DATE OF BIRTH:  [birth year]  SUBJECTIVE:  No acute events overnight. He has chronic dark colored stools REVIEW OF SYSTEMS:  CONSTITUTIONAL: No fever, positive fatigue and weakness.  EYES: No blurred or double vision.  EARS, NOSE, AND THROAT: No tinnitus or ear pain.  RESPIRATORY: No cough, shortness of breath, wheezing or hemoptysis.  CARDIOVASCULAR: No chest pain, orthopnea, edema.  GASTROINTESTINAL: No nausea, vomiting, diarrhea or abdominal pain. Dark-colored stools  GENITOURINARY: No dysuria, hematuria.  ENDOCRINE: No polyuria, nocturia,  HEMATOLOGY: No anemia, easy bruising or bleeding SKIN: No rash or lesion. MUSCULOSKELETAL: No joint pain or arthritis.   NEUROLOGIC: No tingling, numbness, weakness.  PSYCHIATRY: No anxiety or depression.   DRUG ALLERGIES:  No Known Allergies  VITALS:  Blood pressure 104/67, pulse 75, temperature 97.8 F (36.6 C), temperature source Oral, resp. rate 18, height 6\' 3"  (1.905 m), weight 87.1 kg (192 lb 0.3 oz), SpO2 100 %.  PHYSICAL EXAMINATION:  VITAL SIGNS: Filed Vitals:   05/02/15 0558 05/02/15 1100  BP: 104/67   Pulse: 77 75  Temp: 98.2 F (36.8 C) 97.8 F (36.6 C)  Resp:     GENERAL:55 y.o.male currently in no acute distress.  HEAD: Normocephalic, atraumatic.  EYES: Pupils equal, round, reactive to light. Extraocular muscles intact. No scleral icterus.  MOUTH: Moist mucosal membrane. Dentition intact. No abscess noted.  EAR, NOSE, THROAT: Clear without exudates. No external lesions.  NECK: Supple. No thyromegaly. No nodules. No JVD.  PULMONARY: Clear to ascultation, without wheeze rails or rhonci. No use of accessory muscles, Good respiratory effort. good air entry bilaterally CHEST: Nontender to palpation.  CARDIOVASCULAR: S1 and S2. Regular rate and rhythm. No murmurs, rubs, or gallops. No edema. Pedal pulses 2+  bilaterally.  GASTROINTESTINAL: Soft, nontender, nondistended. No masses. Positive bowel sounds. No hepatosplenomegaly.  MUSCULOSKELETAL: No swelling, clubbing, or edema. Range of motion full in all extremities.  NEUROLOGIC: Cranial nerves II through XII are intact. No gross focal neurological deficits. Sensation intact. Reflexes intact.  SKIN: No ulceration, lesions, rashes, or cyanosis. Skin warm and dry. Turgor intact.  PSYCHIATRIC: Mood, affect within normal limits. The patient is awake, alert and oriented x 3. Insight, judgment intact.      LABORATORY PANEL:   CBC  Recent Labs Lab 05/02/15 1106  WBC 6.5  HGB 7.3*  HCT 22.7*  PLT 290   ------------------------------------------------------------------------------------------------------------------  Chemistries   Recent Labs Lab 04/30/15 1825  05/02/15 1106  NA 138  < > 134*  K 4.1  < > 4.9  CL 100*  < > 99*  CO2 33*  < > 24  GLUCOSE 109*  < > 108*  BUN 38*  < > 56*  CREATININE 4.91*  < > 7.38*  CALCIUM 9.6  < > 10.1  AST 26  --   --   ALT 19  --   --   ALKPHOS 128*  --   --   BILITOT 0.6  --   --   < > = values in this interval not displayed. ------------------------------------------------------------------------------------------------------------------  Cardiac Enzymes No results for input(s): TROPONINI in the last 168 hours. ------------------------------------------------------------------------------------------------------------------  RADIOLOGY:  No results found.  EKG:     ASSESSMENT AND PLAN:   55 year old gentleman gentleman history end-stage renal disease on hemodialysis presenting with anemia secondary to GI bleed  1. Symptomatic anemia secondary to acute on chronic upper GI bleed:  Status post 2 unit transfusion, CBC stable. Plan for EGD Monday. (Patient had normal EGD in January with colon polyp on colonoscopy) Continue PPI.  2. [name]: Nephrology consult  appreciated. hE will go for dialysis today.  3. Coronary artery disease status post bypass no angina: Aspirin on holdDue to problem #1.  Continue atorvastatin, Benzapril, labetalol  4. Essential hypertension:Continuel  and Benzapril 5. Hyperlipidemia unspecified:  Continue atorvastatin  6. Venous thromboembolism prophylactic: SCDs        Management plans discussed with the patient CODE STATUS: Full  TOTAL TIME TAKING CARE OF THIS PATIENT: 25 minutes.   POSSIBLE D/C MONDAY, DEPENDING ON CLINICAL CONDITION.   Densel Kronick M.D on 05/02/2015 at 11:47 AM  Between 7am to 6pm - Pager - [phone]  After 6pm: House Pager: - [phone]  [facility]  Office  [phone]  CC: Primary care physician; Elyse Jarvis, MD

## 2015-05-02 NOTE — Consult Note (Signed)
  GI Inpatient Follow-up Note  Patient Identification: Jeson Barrozo is a 55 y.o. male  Subjective: No complaints. Tolerating solids. CBC from today pending. To have HD later today.  Scheduled Inpatient Medications:  . atorvastatin  40 mg Oral Daily  . benazepril  10 mg Oral Daily  . calcium acetate  2,001 mg Oral TID WC  . furosemide  40 mg Oral Daily  . gabapentin  300 mg Oral Daily  . labetalol  200 mg Oral Daily  . multivitamin with minerals  1 tablet Oral Daily  . pantoprazole (PROTONIX) IV  40 mg Intravenous Q12H    Continuous Inpatient Infusions:     PRN Inpatient Medications:  ipratropium-albuterol, oxyCODONE-acetaminophen  Review of Systems: Constitutional: Weight is stable.  Eyes: No changes in vision. ENT: No oral lesions, sore throat.  GI: see HPI.  Heme/Lymph: No easy bruising.  CV: No chest pain.  GU: No hematuria.  Integumentary: No rashes.  Neuro: No headaches.  Psych: No depression/anxiety.  Endocrine: No heat/cold intolerance.  Allergic/Immunologic: No urticaria.  Resp: No cough, SOB.  Musculoskeletal: No joint swelling.    Physical Examination: BP 104/67 mmHg  Pulse 77  Temp(Src) 98.2 F (36.8 C) (Oral)  Resp 18  Ht 6\' 3"  (1.905 m)  Wt 80.015 kg (176 lb 6.4 oz)  BMI 22.05 kg/m2  SpO2 100% Gen: NAD, alert and oriented x 4 HEENT: PEERLA, EOMI, Neck: supple, no JVD or thyromegaly Chest: CTA bilaterally, no wheezes, crackles, or other adventitious sounds CV: RRR, no m/g/c/r Abd: soft, NT, ND, +BS in all four quadrants; no HSM, guarding, ridigity, or rebound tenderness Ext: no edema, well perfused with 2+ pulses, Skin: no rash or lesions noted Lymph: no LAD  Data: Lab Results  Component Value Date   WBC 6.4 05/01/2015   HGB 7.2* 05/01/2015   HCT 22.8* 05/01/2015   MCV 92.6 05/01/2015   PLT 295 05/01/2015    Recent Labs Lab 04/30/15 1712 05/01/15 0341 05/01/15 0959  HGB 5.7* 7.6* 7.2*   Lab Results  Component Value Date    NA 138 05/01/2015   K 4.4 05/01/2015   CL 100* 05/01/2015   CO2 29 05/01/2015   BUN 43* 05/01/2015   CREATININE 5.47* 05/01/2015   Lab Results  Component Value Date   ALT 19 04/30/2015   AST 26 04/30/2015   ALKPHOS 128* 04/30/2015   BILITOT 0.6 04/30/2015    Recent Labs Lab 04/30/15 1703  APTT 28  INR 1.07   Assessment/Plan: Mr. Blackledge is a 55 y.o. male with symptomatic anemia and possible GI bleeding.   Recommendations: Discussed scheduling EGD on Monday afternoon. Pt wants to proceed. Pt aware that EGD will done late in the afternoon and that he needs to be NPO all day prior to EGD. Pt understands. Will schedule. Please call with questions or concerns.  Lilyana Lippman, Lupita Dawn, MD

## 2015-05-03 ENCOUNTER — Inpatient Hospital Stay: Payer: Medicare Other

## 2015-05-03 LAB — URINALYSIS COMPLETE WITH MICROSCOPIC (ARMC ONLY)
Bilirubin Urine: NEGATIVE
GLUCOSE, UA: NEGATIVE mg/dL
HGB URINE DIPSTICK: NEGATIVE
KETONES UR: NEGATIVE mg/dL
Leukocytes, UA: NEGATIVE
NITRITE: NEGATIVE
Protein, ur: 100 mg/dL — AB
SPECIFIC GRAVITY, URINE: 1.008 (ref 1.005–1.030)
pH: 9 — ABNORMAL HIGH (ref 5.0–8.0)

## 2015-05-03 LAB — CBC
HCT: 24.4 % — ABNORMAL LOW (ref 40.0–52.0)
HEMOGLOBIN: 7.8 g/dL — AB (ref 13.0–18.0)
MCH: 30.4 pg (ref 26.0–34.0)
MCHC: 31.9 g/dL — ABNORMAL LOW (ref 32.0–36.0)
MCV: 95.3 fL (ref 80.0–100.0)
Platelets: 275 10*3/uL (ref 150–440)
RBC: 2.56 MIL/uL — AB (ref 4.40–5.90)
RDW: 21 % — ABNORMAL HIGH (ref 11.5–14.5)
WBC: 7.3 10*3/uL (ref 3.8–10.6)

## 2015-05-03 LAB — INFLUENZA PANEL BY PCR (TYPE A & B)
H1N1 flu by pcr: NOT DETECTED
INFLAPCR: NEGATIVE
Influenza B By PCR: NEGATIVE

## 2015-05-03 LAB — VANCOMYCIN, TROUGH: VANCOMYCIN TR: 67 ug/mL — AB (ref 10–20)

## 2015-05-03 MED ORDER — VANCOMYCIN HCL 10 G IV SOLR
2000.0000 mg | Freq: Once | INTRAVENOUS | Status: AC
  Administered 2015-05-03: 2000 mg via INTRAVENOUS
  Filled 2015-05-03: qty 2000

## 2015-05-03 MED ORDER — ACETAMINOPHEN 325 MG PO TABS
650.0000 mg | ORAL_TABLET | Freq: Once | ORAL | Status: AC
  Administered 2015-05-03: 650 mg via ORAL

## 2015-05-03 MED ORDER — ACETAMINOPHEN 325 MG PO TABS
650.0000 mg | ORAL_TABLET | ORAL | Status: DC | PRN
  Administered 2015-05-03 – 2015-05-05 (×6): 650 mg via ORAL
  Filled 2015-05-03 (×7): qty 2

## 2015-05-03 MED ORDER — PIPERACILLIN-TAZOBACTAM 3.375 G IVPB
3.3750 g | Freq: Two times a day (BID) | INTRAVENOUS | Status: DC
  Administered 2015-05-03 – 2015-05-08 (×10): 3.375 g via INTRAVENOUS
  Filled 2015-05-03 (×12): qty 50

## 2015-05-03 MED ORDER — VANCOMYCIN HCL IN DEXTROSE 1-5 GM/200ML-% IV SOLN
1000.0000 mg | INTRAVENOUS | Status: DC
  Administered 2015-05-05 – 2015-05-07 (×2): 1000 mg via INTRAVENOUS
  Filled 2015-05-03 (×4): qty 200

## 2015-05-03 MED ORDER — CINACALCET HCL 30 MG PO TABS
60.0000 mg | ORAL_TABLET | Freq: Every day | ORAL | Status: DC
  Administered 2015-05-03 – 2015-05-07 (×5): 60 mg via ORAL
  Filled 2015-05-03 (×5): qty 2

## 2015-05-03 NOTE — Progress Notes (Addendum)
Pharmacy Antibiotic Note  Stanley [name] is a 55 y.o. male admitted on 04/30/2015 with pneumonia.  Pharmacy has been consulted for Vancomycin and Zosyn  Dosing.  Pt is on HD T-Th-Sat.   Plan: Will start Zosyn 3.375 gm IV Q12H EI.   Will order Vancomycin 2 gm IV X 1 to be given on 2/19 as loading dose. Will order Vancomycin 1 gm IV Q T-Th-Sat to be given during final 60 minutes of each HD session. Will draw 1st Vanc trough before 3rd HD session scheduled for 2/25.   Height: 6\' 3"  (190.5 cm) Weight: 192 lb 0.3 oz (87.1 kg) IBW/kg (Calculated) : 84.5  Temp (24hrs), Avg:99.7 F (37.6 C), Min:97.8 F (36.6 C), Max:102.6 F (39.2 C)   Recent Labs Lab 04/30/15 1712 04/30/15 1825 05/01/15 0341 05/01/15 0959 05/02/15 1106 05/03/15 0535  WBC 6.2  --  6.6 6.4 6.5 7.3  CREATININE  --  4.91* 5.47*  --  7.38*  --     Estimated Creatinine Clearance: 13.5 mL/min (by C-G formula based on Cr of 7.38).    No Known Allergies  Antimicrobials this admission: Zosyn 3.375 gm IV Q12H EI.  Vancomycin 2 gm IV X 1 to be given on 2/19 (loading dose). Vancomycin 1 gm IV Q T-Th-Sat (HD).   Dose adjustments this admission:   Microbiology results:  BCx:  2/19 = NGTD   UCx:    Sputum:    MRSA PCR:   Thank you for allowing pharmacy to be a part of this patient's care.  Ab Leaming D 05/03/2015 9:46 AM

## 2015-05-03 NOTE — Progress Notes (Signed)
[place] [facility] [place] NAME: Stanley [name]    MR#:  [id number]  DATE OF BIRTH:  [birth year]  SUBJECTIVE:  Patient had fever this morning. He reports a cough. No urinary symptoms.   REVIEW OF SYSTEMS:  CONSTITUTIONAL: ++ fever, fatigue and weakness.  EYES: No blurred or double vision.  EARS, NOSE, AND THROAT: No tinnitus or ear pain.  RESPIRATORY: ++ cough, NO shortness of breath, wheezing or hemoptysis.  CARDIOVASCULAR: No chest pain, orthopnea, edema.  GASTROINTESTINAL: No nausea, vomiting, diarrhea or abdominal pain. Dark-colored stools  GENITOURINARY: No dysuria, hematuria.  ENDOCRINE: No polyuria, nocturia,  HEMATOLOGY: No anemia, easy bruising or bleeding SKIN: No rash or lesion. MUSCULOSKELETAL: No joint pain or arthritis.   NEUROLOGIC: No tingling, numbness, weakness.  PSYCHIATRY: No anxiety or depression.   DRUG ALLERGIES:  No Known Allergies  VITALS:  Blood pressure 144/79, pulse 95, temperature 100.4 F (38 C), temperature source Oral, resp. rate 19, height 6\' 3"  (1.905 m), weight 87.1 kg (192 lb 0.3 oz), SpO2 98 %.  PHYSICAL EXAMINATION:  VITAL SIGNS: Filed Vitals:   05/03/15 0757 05/03/15 0901  BP: 144/79   Pulse: 95   Temp: 102.6 F (39.2 C) 100.4 F (38 C)  Resp:     GENERAL:54 y.o.male currently in no acute distress.  HEAD: Normocephalic, atraumatic.  EYES: Pupils equal, round, reactive to light. Extraocular muscles intact. No scleral icterus.  MOUTH: Moist mucosal membrane. Dentition intact. No abscess noted.  EAR, NOSE, THROAT: Clear without exudates. No external lesions.  NECK: Supple. No thyromegaly. No nodules. No JVD.  PULMONARY: Clear to ascultation, without wheeze rails or rhonci. No use of accessory muscles, Good respiratory effort. good air entry bilaterally CHEST: Nontender to palpation.  CARDIOVASCULAR: S1 and S2. Regular rate and rhythm. No murmurs, rubs, or gallops. No edema. Pedal pulses  2+ bilaterally.  GASTROINTESTINAL: Soft, nontender, nondistended. No masses. Positive bowel sounds. No hepatosplenomegaly.  MUSCULOSKELETAL: No swelling, clubbing, or edema. Range of motion full in all extremities.  NEUROLOGIC: Cranial nerves II through XII are intact. No gross focal neurological deficits. Sensation intact. Reflexes intact.  SKIN: No ulceration, lesions, rashes, or cyanosis. Skin warm and dry. Turgor intact.  PSYCHIATRIC: Mood, affect within normal limits. The patient is awake, alert and oriented x 3. Insight, judgment intact.      LABORATORY PANEL:   CBC  Recent Labs Lab 05/03/15 0535  WBC 7.3  HGB 7.8*  HCT 24.4*  PLT 275   ------------------------------------------------------------------------------------------------------------------  Chemistries   Recent Labs Lab 04/30/15 1825  05/02/15 1106  NA 138  < > 134*  K 4.1  < > 4.9  CL 100*  < > 99*  CO2 33*  < > 24  GLUCOSE 109*  < > 108*  BUN 38*  < > 56*  CREATININE 4.91*  < > 7.38*  CALCIUM 9.6  < > 10.1  AST 26  --   --   ALT 19  --   --   ALKPHOS 128*  --   --   BILITOT 0.6  --   --   < > = values in this interval not displayed. ------------------------------------------------------------------------------------------------------------------  Cardiac Enzymes No results for input(s): TROPONINI in the last 168 hours. ------------------------------------------------------------------------------------------------------------------  RADIOLOGY:  No results found.  EKG:     ASSESSMENT AND PLAN:   55 year old gentleman history end-stage renal disease on hemodialysis presenting with anemia secondary to GI bleed  1. Symptomatic anemia secondary to acute on  chronic upper GI bleed: Status post 2 unit transfusion, CBC stable. Plan for EGD Monday, assuming he is afebrile for 24 hours.. (Patient had normal EGD in January with colon polyp on colonoscopy) Continue PPI.   2. FEVER: Check blood  cultures 2., Chest x-ray and urinalysis. Heart Empiric Zosyn and vancomycin. Test for influenza as well. 3 [name]: Nephrology consult appreciated. Continue dialysis as per nephrology. 4. Coronary artery disease status post bypass no angina: Aspirin on hold Due to problem #1.  Continue atorvastatin, Benzapril, labetalol  5. Essential hypertension:Continuel  and Benzapril 6. Hyperlipidemia unspecified:  Continue atorvastatin     Venous thromboembolism prophylactic: SCDs        Management plans discussed with the patient CODE STATUS: Full  TOTAL TIME TAKING CARE OF THIS PATIENT: 29 minutes.  Discussed with Dr. Candace Cruise POSSIBLE D/C 1-2 days DEPENDING ON CLINICAL CONDITION.   Margarine Grosshans M.D on 05/03/2015 at 9:41 AM  Between 7am to 6pm - Pager - [phone]  After 6pm: House Pager: - [phone]  [facility]  Office  [phone]  CC: Primary care physician; Elyse Jarvis, MD

## 2015-05-03 NOTE — Progress Notes (Signed)
Spoke with Dr. Doy Hutching, patient's temperature elevated, remainder of vital signs stable. Order for cooling blanket and repeat tylenol. Blood cultures drawn this am. Lauris Poag, RN 05/03/15 1908

## 2015-05-03 NOTE — Progress Notes (Signed)
[facility]  ROUNDING NOTE   Subjective:   Hemodialysis yesterday. Tolerated treatment well.  Febrile Tmax 102.6. Influenza sent, CXR ordered.   Continues to complain of left toe pain.   Objective:  Vital signs in last 24 hours:  Temp:  [97.8 F (36.6 C)-102.6 F (39.2 C)] 100.4 F (38 C) (02/19 0901) Pulse Rate:  [75-101] 95 (02/19 0757) Resp:  [19] 19 (02/19 0528) BP: (144-149)/(74-79) 144/79 mmHg (02/19 0757) SpO2:  [98 %-100 %] 98 % (02/19 0528) Weight:  [87.1 kg (192 lb 0.3 oz)] 87.1 kg (192 lb 0.3 oz) (02/18 1100)  Weight change:  Filed Weights   04/30/15 1649 04/30/15 2009 05/02/15 1100  Weight: 81.647 kg (180 lb) 80.015 kg (176 lb 6.4 oz) 87.1 kg (192 lb 0.3 oz)    Intake/Output: I/O last 3 completed shifts: In: 1200 [P.O.:1200] Out: 2500 [Urine:1000; Other:1500]   Intake/Output this shift:     Physical Exam: General: NAD,   Head: Normocephalic, atraumatic. Moist oral mucosal membranes  Eyes: Anicteric, PERRL  Neck: Supple, trachea midline  Lungs:  Clear to auscultation  Heart: Regular rate and rhythm  Abdomen:  Soft, nontender, distended  Extremities:  no peripheral edema.  Neurologic: Nonfocal, moving all four extremities  Skin: No lesions  Access: Right arm AVF    Basic Metabolic Panel:  Recent Labs Lab 04/30/15 1825 05/01/15 0341 05/02/15 1106  NA 138 138 134*  K 4.1 4.4 4.9  CL 100* 100* 99*  CO2 33* 29 24  GLUCOSE 109* 75 108*  BUN 38* 43* 56*  CREATININE 4.91* 5.47* 7.38*  CALCIUM 9.6 9.9 10.1  PHOS  --   --  6.9*    Liver Function Tests:  Recent Labs Lab 04/30/15 1825 05/02/15 1106  AST 26  --   ALT 19  --   ALKPHOS 128*  --   BILITOT 0.6  --   PROT 6.2*  --   ALBUMIN 3.0* 2.9*   No results for input(s): LIPASE, AMYLASE in the last 168 hours. No results for input(s): AMMONIA in the last 168 hours.  CBC:  Recent Labs Lab 04/30/15 1712 05/01/15 0341 05/01/15 0959 05/02/15 1106 05/03/15 0535  WBC  6.2 6.6 6.4 6.5 7.3  NEUTROABS 4.4  --   --   --   --   HGB 5.7* 7.6* 7.2* 7.3* 7.8*  HCT 18.6* 22.8* 22.8* 22.7* 24.4*  MCV 96.6 93.3 92.6 91.9 95.3  PLT 319 307 295 290 275    Cardiac Enzymes: No results for input(s): CKTOTAL, CKMB, CKMBINDEX, TROPONINI in the last 168 hours.  BNP: Invalid input(s): POCBNP  CBG: No results for input(s): GLUCAP in the last 168 hours.  Microbiology: Results for orders placed or performed during the hospital encounter of 04/30/15  MRSA PCR Screening     Status: None   Collection Time: 05/01/15  1:36 AM  Result Value Ref Range Status   MRSA by PCR NEGATIVE NEGATIVE Final    Comment:        The GeneXpert MRSA Assay (FDA approved for NASAL specimens only), is one component of a comprehensive MRSA colonization surveillance program. It is not intended to diagnose MRSA infection nor to guide or monitor treatment for MRSA infections.     Coagulation Studies:  Recent Labs  04/30/15 1703  LABPROT 14.1  INR 1.07    Urinalysis:  Recent Labs  05/03/15 0815  COLORURINE YELLOW*  LABSPEC 1.008  PHURINE 9.0*  GLUCOSEU NEGATIVE  HGBUR NEGATIVE  BILIRUBINUR NEGATIVE  KETONESUR NEGATIVE  PROTEINUR 100*  NITRITE NEGATIVE  LEUKOCYTESUR NEGATIVE      Imaging: No results found.   Medications:     . atorvastatin  40 mg Oral Daily  . benazepril  10 mg Oral Daily  . calcium acetate  2,001 mg Oral TID WC  . furosemide  40 mg Oral Daily  . gabapentin  300 mg Oral Daily  . labetalol  200 mg Oral Daily  . multivitamin with minerals  1 tablet Oral Daily  . pantoprazole (PROTONIX) IV  40 mg Intravenous Q12H  . piperacillin-tazobactam (ZOSYN)  IV  3.375 g Intravenous Q12H  . vancomycin  2,000 mg Intravenous Once  . [START ON 05/05/2015] vancomycin  1,000 mg Intravenous Q T,Th,Sa-HD   acetaminophen, ipratropium-albuterol, oxyCODONE-acetaminophen  Assessment/ Plan:  [name] is a 55 y.o. black male with end-stage renal  disease, dialysis at [facility], coronary disease, hypertension, history of alcohol abuse, history of drug abuse  [facility] [address] TTS  1. End-stage renal disease:tolerated hemodialysis treatment well yesterday - continue TTS schedule.   2. Anemia of chronic kidney disease: hemoglobin 5.7 on admission. Recent GI bleed. Status post 2 units PRBC on 2/16. Colonoscopy done on 1/29.  - epo with HD treatment. - Appreciate GI input: EGD for Monday  3. Secondary hyperparathyroidism: outpatient PTH 181, phos 7.2. Hectorol with outpatient treatments.  - calcium acetate 3 tabs with meals. Calcium elevated at 10.1, and phosphorus elevated at 6.9. May need to change from calcium acetate to sevelamer. Patient claims to be taking binders.  - currently off cinacalcet. Restart today 60mg  daily  4. Ascites from liver cirrhosis: status post paracentesis on 04/06/15. 4 litres removed. Currently without much ascites.   5. Hypertension: well controlled.  - amlodipine, benazepril, labetolol   LOS: [place], [place] 2/19/20179:46 AM

## 2015-05-04 ENCOUNTER — Encounter
Admission: EM | Disposition: A | Discharge status: Home / Self Care | Payer: Self-pay | Source: Home / Self Care | Attending: Internal Medicine

## 2015-05-04 ENCOUNTER — Inpatient Hospital Stay: Payer: Medicare Other

## 2015-05-04 LAB — CBC
HEMATOCRIT: 24.1 % — AB (ref 40.0–52.0)
HEMOGLOBIN: 7.7 g/dL — AB (ref 13.0–18.0)
MCH: 29.5 pg (ref 26.0–34.0)
MCHC: 32 g/dL (ref 32.0–36.0)
MCV: 92 fL (ref 80.0–100.0)
Platelets: 238 10*3/uL (ref 150–440)
RBC: 2.62 MIL/uL — AB (ref 4.40–5.90)
RDW: 19.8 % — ABNORMAL HIGH (ref 11.5–14.5)
WBC: 8.1 10*3/uL (ref 3.8–10.6)

## 2015-05-04 LAB — TRANSFUSION REACTION
DAT C3: NEGATIVE
Post RXN DAT IgG: NEGATIVE

## 2015-05-04 LAB — MRSA PCR SCREENING: MRSA by PCR: NEGATIVE

## 2015-05-04 LAB — RAPID HIV SCREEN (HIV 1/2 AB+AG)
HIV 1/2 Antibodies: NONREACTIVE
HIV-1 P24 Antigen - HIV24: NONREACTIVE

## 2015-05-04 SURGERY — EGD (ESOPHAGOGASTRODUODENOSCOPY)
Anesthesia: General | Laterality: Left

## 2015-05-04 MED ORDER — OSELTAMIVIR PHOSPHATE 30 MG PO CAPS
30.0000 mg | ORAL_CAPSULE | ORAL | Status: AC
  Administered 2015-05-05 – 2015-05-07 (×2): 30 mg via ORAL
  Filled 2015-05-04 (×4): qty 1

## 2015-05-04 MED ORDER — SODIUM CHLORIDE 0.9 % IV SOLN
INTRAVENOUS | Status: DC
  Administered 2015-05-04: 06:00:00 via INTRAVENOUS

## 2015-05-04 MED ORDER — OSELTAMIVIR PHOSPHATE 30 MG PO CAPS
30.0000 mg | ORAL_CAPSULE | ORAL | Status: AC
  Administered 2015-05-04: 30 mg via ORAL
  Filled 2015-05-04: qty 1

## 2015-05-04 MED ORDER — OSELTAMIVIR PHOSPHATE 30 MG PO CAPS
30.0000 mg | ORAL_CAPSULE | Freq: Every day | ORAL | Status: DC
  Filled 2015-05-04: qty 1

## 2015-05-04 MED ORDER — OSELTAMIVIR PHOSPHATE 75 MG PO CAPS: 75.0000 mg | ORAL_CAPSULE | Freq: Two times a day (BID) | ORAL | Status: DC

## 2015-05-04 NOTE — Progress Notes (Signed)
Pt's temp took approx. 3.5 hrs to decrease from 103.1 to 99.9 with the cooling blanket.

## 2015-05-04 NOTE — Progress Notes (Signed)
Pt. Still having elevated temp. Cooling blanket was used and temp down to 99.9. Cooling blanked removed as rectal probe line detached and pt noted that he was too tired to roll over to get it from under him. Pt complained of pain in the legs and one Percocet was admin. At 03:20 Pt's temp up to 102.1. MD notified and advised to monitor as PT has gotten a several doses of Tylenol and was admitted for GI bleed.

## 2015-05-04 NOTE — Progress Notes (Signed)
Per report Pt. Had been having elevated temp's and Tylenol 650 had been admin. At 20:07 Pt's temp at 103.1. MD notified. Pt had been previously ordered a cooling blanket and with the assistance of the CCU charge nurse a rectal probe was inserted and Pt's temp monitored.

## 2015-05-04 NOTE — Plan of Care (Signed)
Problem: Physical Regulation: Goal: Ability to maintain clinical measurements within normal limits will improve Outcome: Not Progressing Pt conjtinues to intermittently spike fevers, relieves in the short term with PO Acetaminophen  Goal: Will remain free from infection Outcome: Not Progressing Pt continues to spike fevers of currently unknown cause, source of infection is currently unknown

## 2015-05-04 NOTE — Progress Notes (Signed)
05/04/2015  9:00  Upon assessment, noted pronounced expiratory wheezes and pt felt hot.  Pt's temperature was measured at 99 degrees farenheit and respirations at 22/minute.  Notified MD that fluid overload was sus[ected to due to symptoms and that this is a hemodialysis pt.  Dr. Manuella Ghazi ordered stat chest X-ray.  Will continue to monitor and assess. Dola Argyle, RN

## 2015-05-04 NOTE — Progress Notes (Signed)
[facility] Infectious Disease     Reason for ConsultFUO    Referring Physician: Max Sane Date of Admission:  04/30/2015   Principal Problem:   Symptomatic anemia Active Problems:   GIB (gastrointestinal bleeding)   HPI: Stanley [name] is a 55 y.o. male  with a known history of coronary artery disease status post CABG, hypertension, gastroesophageal reflux disease, alcohol abuse and liver cirrhosis, end-stage renal disease on hemodialysis-admitted with low hgb from HD.  WBC on admit was 6.4. He has since developed fevers to 104, starting 2/18, started vanco and zosyn 2/19 after bcx done. Flu negative, CXR negative. WBC only 8. L:FTs nml on admit. UA negative. Did receive blood 2/16 and 2/17.  He reports feeling achy all over and having a cough, st and HA.  Denies pain at AVG site, recent procedures, skin lesions, rash.    Past Medical History  Diagnosis Date  . Coronary artery disease   . Hypertension   . GERD (gastroesophageal reflux disease)   . Suicidal ideation     & HOMICIDAL IDEATION --  06-16-2013   ADMITTED TO BEHAVIOR HEALTH  . Alcohol abuse   . Drug abuse   . Diabetic peripheral neuropathy ([place])   . End stage renal disease on dialysis [facility]) NEPHROLOGIST-   DR [facility]  IN [place]    HEMODIALYSIS --   TUES/  THURS/  SAT  . Cirrhosis [facility])    Past Surgical History  Procedure Laterality Date  . Dialysis fistula creation  LAST SURGERY  APPOX  2008  . Orif mandibular fracture N/A 06/05/2013    Procedure: REPAIR OF MANDIBULAR FRACTURE x 2 with maxillo-mandibular fixation ;  Surgeon: Theodoro Kos, DO;  Location: [place];  Service: Plastics;  Laterality: N/A;  . Coronary artery bypass graft  2008  ([place] , [place])    3 VESSEL  . Coronary angioplasty  ?    PT UNABLE TO TELL IF  BEFORE OR AFTER  CABG  . Mandibular hardware removal N/A 07/29/2013    Procedure: REMOVAL OF ARCH BARS;  Surgeon: Theodoro Kos, DO;  Location: [place];  Service: Plastics;   Laterality: N/A;   Social History  Substance Use Topics  . Smoking status: Light Tobacco Smoker -- 0.00 packs/day for 40 years    Types: Cigarettes  . Smokeless tobacco: Never Used  . Alcohol Use: No   Family History  Problem Relation Age of Onset  . Cancer Mother   . Cancer Father   . Cancer Sister     Allergies: No Known Allergies  Current antibiotics: Antibiotics Given (last 72 hours)    Date/Time Action Medication Dose Rate   05/03/15 1027 Given   piperacillin-tazobactam (ZOSYN) IVPB 3.375 g 3.375 g 12.5 mL/hr   05/03/15 1027 Given   vancomycin (VANCOCIN) 2,000 mg in sodium chloride 0.9 % 500 mL IVPB 2,000 mg 250 mL/hr   05/03/15 2150 Given   piperacillin-tazobactam (ZOSYN) IVPB 3.375 g 3.375 g 12.5 mL/hr   05/04/15 1045 Given   piperacillin-tazobactam (ZOSYN) IVPB 3.375 g 3.375 g 12.5 mL/hr      MEDICATIONS: . atorvastatin  40 mg Oral Daily  . benazepril  10 mg Oral Daily  . calcium acetate  2,001 mg Oral TID WC  . cinacalcet  60 mg Oral Q supper  . furosemide  40 mg Oral Daily  . gabapentin  300 mg Oral Daily  . labetalol  200 mg Oral Daily  . multivitamin with minerals  1 tablet Oral  Daily  . oseltamivir  75 mg Oral BID  . pantoprazole (PROTONIX) IV  40 mg Intravenous Q12H  . piperacillin-tazobactam (ZOSYN)  IV  3.375 g Intravenous Q12H  . [START ON 05/05/2015] vancomycin  1,000 mg Intravenous Q T,Th,Sa-HD    Review of Systems - 11 systems reviewed and negative per HPI   OBJECTIVE: Temp:  [98.6 F (37 C)-104.1 F (40.1 C)] 104.1 F (40.1 C) (02/20 1437) Pulse Rate:  [77-89] 77 (02/20 1437) Resp:  [16-25] 20 (02/20 1437) BP: (118-135)/(65-79) 119/74 mmHg (02/20 1437) SpO2:  [93 %-100 %] 93 % (02/20 1437) Physical Exam  Constitutional: He is oriented to person, place, and time. He appears well-developed and well-nourished. No distress.  HENT: muddy sclera Mouth/Throat: Oropharynx is clear and moist. No oropharyngeal exudate.  Cardiovascular: Normal  rate, regular rhythm and normal heart sounds. 2/6 sm Pulmonary/Chest:bil exp wheeze, and rhonchi Abdominal: Soft. Bowel sounds are normal. He exhibits mild distension. There is mild tenderness.  Lymphadenopathy: He has some shoddy cervical adenopathy.  Neurological: He is alert and oriented to person, place, and time.  Skin: Skin is warm and dry. No rash noted. No erythema.  Psychiatric: He has a normal mood and affect. His behavior is normal.  RUE AVG site wnl  LABS: Results for orders placed or performed during the hospital encounter of 04/30/15 (from the past 48 hour(s))  CBC     Status: Abnormal   Collection Time: 05/03/15  5:35 AM  Result Value Ref Range   WBC 7.3 3.8 - 10.6 K/uL   RBC 2.56 (L) 4.40 - 5.90 MIL/uL   Hemoglobin 7.8 (L) 13.0 - 18.0 g/dL   HCT 24.4 (L) 40.0 - 52.0 %   MCV 95.3 80.0 - 100.0 fL   MCH 30.4 26.0 - 34.0 pg   MCHC 31.9 (L) 32.0 - 36.0 g/dL   RDW 21.0 (H) 11.5 - 14.5 %   Platelets 275 150 - 440 K/uL  Urinalysis complete, with microscopic ([facility] only)     Status: Abnormal   Collection Time: 05/03/15  8:15 AM  Result Value Ref Range   Color, Urine YELLOW (A) YELLOW   APPearance CLEAR (A) CLEAR   Glucose, UA NEGATIVE NEGATIVE mg/dL   Bilirubin Urine NEGATIVE NEGATIVE   Ketones, ur NEGATIVE NEGATIVE mg/dL   Specific Gravity, Urine 1.008 1.005 - 1.030   Hgb urine dipstick NEGATIVE NEGATIVE   pH 9.0 (H) 5.0 - 8.0   Protein, ur 100 (A) NEGATIVE mg/dL   Nitrite NEGATIVE NEGATIVE   Leukocytes, UA NEGATIVE NEGATIVE   RBC / HPF 0-5 0 - 5 RBC/hpf   WBC, UA 0-5 0 - 5 WBC/hpf   Bacteria, UA RARE (A) NONE SEEN   Squamous Epithelial / LPF 0-5 (A) NONE SEEN  Culture, blood (Routine X 2) w Reflex to ID Panel     Status: None (Preliminary result)   Collection Time: 05/03/15  8:29 AM  Result Value Ref Range   Specimen Description BLOOD LEFT ARM    Special Requests BOTTLES DRAWN AEROBIC AND ANAEROBIC  1CC    Culture NO GROWTH < 24 HOURS    Report Status PENDING    Culture, blood (Routine X 2) w Reflex to ID Panel     Status: None (Preliminary result)   Collection Time: 05/03/15  8:44 AM  Result Value Ref Range   Specimen Description BLOOD LEFT HAND    Special Requests      BOTTLES DRAWN AEROBIC AND ANAEROBIC  AER 6CC ANA 3CC  Culture NO GROWTH < 24 HOURS    Report Status PENDING   Influenza panel by PCR (type A & B, H1N1)     Status: None   Collection Time: 05/03/15 10:35 AM  Result Value Ref Range   Influenza A By PCR NEGATIVE NEGATIVE   Influenza B By PCR NEGATIVE NEGATIVE   H1N1 flu by pcr NOT DETECTED NOT DETECTED    Comment:        The Xpert Flu assay (FDA approved for nasal aspirates or washes and nasopharyngeal swab specimens), is intended as an aid in the diagnosis of influenza and should not be used as a sole basis for treatment.   Vancomycin, trough     Status: Abnormal   Collection Time: 05/03/15 12:12 PM  Result Value Ref Range   Vancomycin Tr 67 (HH) 10 - 20 ug/mL    Comment: CRITICAL RESULT CALLED TO, READ BACK BY AND VERIFIED WITH JASON ROBBINS AT 1310 05/03/15 DAS   CBC     Status: Abnormal   Collection Time: 05/04/15  6:11 AM  Result Value Ref Range   WBC 8.1 3.8 - 10.6 K/uL   RBC 2.62 (L) 4.40 - 5.90 MIL/uL   Hemoglobin 7.7 (L) 13.0 - 18.0 g/dL   HCT 24.1 (L) 40.0 - 52.0 %   MCV 92.0 80.0 - 100.0 fL   MCH 29.5 26.0 - 34.0 pg   MCHC 32.0 32.0 - 36.0 g/dL   RDW 19.8 (H) 11.5 - 14.5 %   Platelets 238 150 - 440 K/uL  MRSA PCR Screening     Status: None   Collection Time: 05/04/15 11:24 AM  Result Value Ref Range   MRSA by PCR NEGATIVE NEGATIVE    Comment:        The GeneXpert MRSA Assay (FDA approved for NASAL specimens only), is one component of a comprehensive MRSA colonization surveillance program. It is not intended to diagnose MRSA infection nor to guide or monitor treatment for MRSA infections.    No components found for: ESR, C REACTIVE PROTEIN MICRO: Recent Results (from the past 720 hour(s))   MRSA PCR Screening     Status: None   Collection Time: 05/01/15  1:36 AM  Result Value Ref Range Status   MRSA by PCR NEGATIVE NEGATIVE Final    Comment:        The GeneXpert MRSA Assay (FDA approved for NASAL specimens only), is one component of a comprehensive MRSA colonization surveillance program. It is not intended to diagnose MRSA infection nor to guide or monitor treatment for MRSA infections.   Culture, blood (Routine X 2) w Reflex to ID Panel     Status: None (Preliminary result)   Collection Time: 05/03/15  8:29 AM  Result Value Ref Range Status   Specimen Description BLOOD LEFT ARM  Final   Special Requests BOTTLES DRAWN AEROBIC AND ANAEROBIC  1CC  Final   Culture NO GROWTH < 24 HOURS  Final   Report Status PENDING  Incomplete  Culture, blood (Routine X 2) w Reflex to ID Panel     Status: None (Preliminary result)   Collection Time: 05/03/15  8:44 AM  Result Value Ref Range Status   Specimen Description BLOOD LEFT HAND  Final   Special Requests   Final    BOTTLES DRAWN AEROBIC AND ANAEROBIC  AER 6CC ANA 3CC   Culture NO GROWTH < 24 HOURS  Final   Report Status PENDING  Incomplete  MRSA PCR Screening  Status: None   Collection Time: 05/04/15 11:24 AM  Result Value Ref Range Status   MRSA by PCR NEGATIVE NEGATIVE Final    Comment:        The GeneXpert MRSA Assay (FDA approved for NASAL specimens only), is one component of a comprehensive MRSA colonization surveillance program. It is not intended to diagnose MRSA infection nor to guide or monitor treatment for MRSA infections.     IMAGING: Dg Chest 1 View  05/03/2015  CLINICAL DATA:  Cough.  Smoker. EXAM: CHEST 1 VIEW COMPARISON:  04/05/2015. FINDINGS: Stable enlarged cardiac silhouette and post CABG changes. Mild linear scarring in the left lower lung zone. Otherwise, clear lungs with normal vascularity. No pleural fluid. Unremarkable bones. IMPRESSION: No acute abnormality.  Stable cardiomegaly.  Electronically Signed   By: Claudie Revering M.D.   On: 05/03/2015 11:22   Dg Chest 2 View  05/04/2015  CLINICAL DATA:  Fever. Weakness. Fluid volume excess. End-stage renal disease on dialysis. Coronary artery disease EXAM: CHEST  2 VIEW COMPARISON:  05/03/2015 FINDINGS: Moderate cardiomegaly remains stable. No evidence of acute infiltrate or edema. No evidence pleural effusion. Prior CABG again noted. IMPRESSION: Stable cardiomegaly.  No active lung disease. Electronically Signed   By: Earle Gell M.D.   On: 05/04/2015 09:56   Dg Chest 2 View  04/05/2015  CLINICAL DATA:  Acute onset of shortness of breath. Generalized abdominal distention. Initial encounter. EXAM: CHEST  2 VIEW COMPARISON:  Chest radiograph performed 03/26/2015 FINDINGS: The lungs are well-aerated. Vascular congestion is noted. There is no evidence of focal opacification, pleural effusion or pneumothorax. The heart is borderline enlarged. The patient is status post median sternotomy. No acute osseous abnormalities are seen. IMPRESSION: Vascular congestion and borderline cardiomegaly. Lungs remain grossly clear. Electronically Signed   By: Garald Balding M.D.   On: 04/05/2015 23:48   US Paracentesis  04/06/2015  INDICATION: 55 year old male with recurrent large volume paracentesis EXAM: ULTRASOUND GUIDED  PARACENTESIS MEDICATIONS: None. ANESTHESIA/SEDATION: None COMPLICATIONS: None immediate. PROCEDURE: Informed written consent was obtained from the patient after a discussion of the risks, benefits and alternatives to treatment. A timeout was performed prior to the initiation of the procedure. Initial ultrasound scanning demonstrates a large amount of ascites within the right lower abdominal quadrant. The right lower abdomen was prepped and draped in the usual sterile fashion. 1% lidocaine with epinephrine was used for local anesthesia. A 6 French Safe-T-Centesis catheter was then advanced into the peritoneal space. An ultrasound image was  saved for documentation purposes. The paracentesis was performed. The catheter was removed and a dressing was applied. The patient tolerated the procedure well without immediate post procedural complication. FINDINGS: A total of approximately 4000 mL of yellow ascitic fluid was removed. Samples were sent to the laboratory as requested by the clinical team. IMPRESSION: Successful ultrasound-guided paracentesis yielding 4 L liters of peritoneal fluid. Electronically Signed   By: Jacqulynn Cadet M.D.   On: 04/06/2015 17:03    Assessment:   Shubham Thackston is a 55 y.o. male with a known history of coronary artery disease status post CABG, hypertension, gastroesophageal reflux disease, alcohol abuse and liver cirrhosis, end-stage renal disease on hemodialysis-admitted with low hgb from HD.  WBC on admit was 6.4. He has since developed fevers to 104, starting 2/18, started vanco and zosyn 2/19 after bcx done. Flu negative, CXR negative. WBC only 8. L:FTs nml on admit. UA negative. Did receive blood 2/16 and 2/17.  His sxs of body aches,  cough and HA are consistent with resp infection.  No evidence of AVG infection although bacteremia always a possibility in HD patient.  SBP possibility as well - had paracentesis of 5 L in Mar 24 2015 with only 103 wbc and 1 % PMNS.  Recommendations Consider transfusion reaction as cause. Has not bumped his hgb much despite 2 units PRBC Check HIV Check sputum cx Cont vanco and zosyn pending cx results Consider Abd USS to eval for ascites and if fevers persist would do paracentesis.  Thank you very much for allowing me to participate in the care of this patient. Please call with questions.   Cheral Marker. Ola Spurr, MD

## 2015-05-04 NOTE — Progress Notes (Signed)
Subjective:  Patient is doing poorly He has a fever.  MAXIMUM TEMPERATURE 103.1 Reports decreased appetite No shortness of breath   Objective:  Vital signs in last 24 hours:  Temp:  [98.6 F (37 C)-103.1 F (39.5 C)] 98.6 F (37 C) (02/20 1233) Pulse Rate:  [82-89] 82 (02/20 1233) Resp:  [16-25] 16 (02/20 1233) BP: (118-135)/(65-79) 119/71 mmHg (02/20 1233) SpO2:  [93 %-100 %] 100 % (02/20 1233)  Weight change:  Filed Weights   04/30/15 1649 04/30/15 2009 05/02/15 1100  Weight: 81.647 kg (180 lb) 80.015 kg (176 lb 6.4 oz) 87.1 kg (192 lb 0.3 oz)    Intake/Output:    Intake/Output Summary (Last 24 hours) at 05/04/15 1351 Last data filed at 05/04/15 1244  Gross per 24 hour  Intake 288.67 ml  Output   1000 ml  Net -711.33 ml     Physical Exam: General: Laying in the bed  HEENT Moist mucous membranes  Neck supple  Pulm/lungs Normal respiratory effort, clear  CVS/Heart No rub or gallop  Abdomen:  Mildly distended, nontender, soft  Extremities: trace edema  Neurologic: alert  Skin: No acute rashes  Access: AV fistula       Basic Metabolic Panel:   Recent Labs Lab 04/30/15 1825 05/01/15 0341 05/02/15 1106  NA 138 138 134*  K 4.1 4.4 4.9  CL 100* 100* 99*  CO2 33* 29 24  GLUCOSE 109* 75 108*  BUN 38* 43* 56*  CREATININE 4.91* 5.47* 7.38*  CALCIUM 9.6 9.9 10.1  PHOS  --   --  6.9*     CBC:  Recent Labs Lab 04/30/15 1712 05/01/15 0341 05/01/15 0959 05/02/15 1106 05/03/15 0535 05/04/15 0611  WBC 6.2 6.6 6.4 6.5 7.3 8.1  NEUTROABS 4.4  --   --   --   --   --   HGB 5.7* 7.6* 7.2* 7.3* 7.8* 7.7*  HCT 18.6* 22.8* 22.8* 22.7* 24.4* 24.1*  MCV 96.6 93.3 92.6 91.9 95.3 92.0  PLT 319 307 295 290 275 238      Microbiology:  Recent Results (from the past 720 hour(s))  MRSA PCR Screening     Status: None   Collection Time: 05/01/15  1:36 AM  Result Value Ref Range Status   MRSA by PCR NEGATIVE NEGATIVE Final    Comment:        The  GeneXpert MRSA Assay (FDA approved for NASAL specimens only), is one component of a comprehensive MRSA colonization surveillance program. It is not intended to diagnose MRSA infection nor to guide or monitor treatment for MRSA infections.   Culture, blood (Routine X 2) w Reflex to ID Panel     Status: None (Preliminary result)   Collection Time: 05/03/15  8:29 AM  Result Value Ref Range Status   Specimen Description BLOOD LEFT ARM  Final   Special Requests BOTTLES DRAWN AEROBIC AND ANAEROBIC  1CC  Final   Culture NO GROWTH < 24 HOURS  Final   Report Status PENDING  Incomplete  Culture, blood (Routine X 2) w Reflex to ID Panel     Status: None (Preliminary result)   Collection Time: 05/03/15  8:44 AM  Result Value Ref Range Status   Specimen Description BLOOD LEFT HAND  Final   Special Requests   Final    BOTTLES DRAWN AEROBIC AND ANAEROBIC  AER 6CC ANA 3CC   Culture NO GROWTH < 24 HOURS  Final   Report Status PENDING  Incomplete  MRSA PCR Screening  Status: None   Collection Time: 05/04/15 11:24 AM  Result Value Ref Range Status   MRSA by PCR NEGATIVE NEGATIVE Final    Comment:        The GeneXpert MRSA Assay (FDA approved for NASAL specimens only), is one component of a comprehensive MRSA colonization surveillance program. It is not intended to diagnose MRSA infection nor to guide or monitor treatment for MRSA infections.     Coagulation Studies: No results for input(s): LABPROT, INR in the last 72 hours.  Urinalysis:  Recent Labs  05/03/15 0815  COLORURINE YELLOW*  LABSPEC 1.008  PHURINE 9.0*  GLUCOSEU NEGATIVE  HGBUR NEGATIVE  BILIRUBINUR NEGATIVE  KETONESUR NEGATIVE  PROTEINUR 100*  NITRITE NEGATIVE  LEUKOCYTESUR NEGATIVE      Imaging: Dg Chest 1 View  05/03/2015  CLINICAL DATA:  Cough.  Smoker. EXAM: CHEST 1 VIEW COMPARISON:  04/05/2015. FINDINGS: Stable enlarged cardiac silhouette and post CABG changes. Mild linear scarring in the left lower  lung zone. Otherwise, clear lungs with normal vascularity. No pleural fluid. Unremarkable bones. IMPRESSION: No acute abnormality.  Stable cardiomegaly. Electronically Signed   By: Claudie Revering M.D.   On: 05/03/2015 11:22   Dg Chest 2 View  05/04/2015  CLINICAL DATA:  Fever. Weakness. Fluid volume excess. End-stage renal disease on dialysis. Coronary artery disease EXAM: CHEST  2 VIEW COMPARISON:  05/03/2015 FINDINGS: Moderate cardiomegaly remains stable. No evidence of acute infiltrate or edema. No evidence pleural effusion. Prior CABG again noted. IMPRESSION: Stable cardiomegaly.  No active lung disease. Electronically Signed   By: Earle Gell M.D.   On: 05/04/2015 09:56     Medications:   . sodium chloride 20 mL/hr at 05/04/15 0604   . atorvastatin  40 mg Oral Daily  . benazepril  10 mg Oral Daily  . calcium acetate  2,001 mg Oral TID WC  . cinacalcet  60 mg Oral Q supper  . furosemide  40 mg Oral Daily  . gabapentin  300 mg Oral Daily  . labetalol  200 mg Oral Daily  . multivitamin with minerals  1 tablet Oral Daily  . pantoprazole (PROTONIX) IV  40 mg Intravenous Q12H  . piperacillin-tazobactam (ZOSYN)  IV  3.375 g Intravenous Q12H  . [START ON 05/05/2015] vancomycin  1,000 mg Intravenous Q T,Th,Sa-HD   acetaminophen, ipratropium-albuterol, oxyCODONE-acetaminophen  Assessment/ Plan:  55 y.o.african American male  with end-stage renal disease, dialysis at [facility], coronary disease, hypertension, history of alcohol abuse, history of drug abuse  [facility] [address] TTS  1. End-stage renal disease:  - continue TTS schedule.   2. Anemia of chronic kidney disease and recent GI bleed.  -  Status post 2 units PRBC on 2/16. Colonoscopy done on 1/29.  - epo with HD treatment. -  EGD planned  3. Secondary hyperparathyroidism: - monitor ca/phos during hospitalization  4. Ascites from liver cirrhosis: status post paracentesis on 04/06/15. 4 litres removed. Currently  without much ascites.   5. Fever - Is concerning - Urinalysis is negative for infection - Patient has AV fistula.  No PermCath - Source of the fever is unclear   LOS: 4 Telesforo Brosnahan 2/20/20171:51 PM

## 2015-05-04 NOTE — Consult Note (Signed)
GI Inpatient Follow-up Note  Patient Identification: Stanley [name] is a 55 y.o. male  Subjective: No active bleeding. Found to have T of 103.26F in preop in endoscopy.  Having respiratory complaints. Fever despite zosyn that was started yesterday.  Scheduled Inpatient Medications:  . [MAR Hold] atorvastatin  40 mg Oral Daily  . [MAR Hold] benazepril  10 mg Oral Daily  . [MAR Hold] calcium acetate  2,001 mg Oral TID WC  . [MAR Hold] cinacalcet  60 mg Oral Q supper  . [MAR Hold] furosemide  40 mg Oral Daily  . [MAR Hold] gabapentin  300 mg Oral Daily  . [MAR Hold] labetalol  200 mg Oral Daily  . [MAR Hold] multivitamin with minerals  1 tablet Oral Daily  . [MAR Hold] oseltamivir  75 mg Oral BID  . [MAR Hold] pantoprazole (PROTONIX) IV  40 mg Intravenous Q12H  . [MAR Hold] piperacillin-tazobactam (ZOSYN)  IV  3.375 g Intravenous Q12H  . [MAR Hold] vancomycin  1,000 mg Intravenous Q T,Th,Sa-HD    Continuous Inpatient Infusions:   . sodium chloride 20 mL/hr at 05/04/15 0604    PRN Inpatient Medications:  [MAR Hold] acetaminophen, [MAR Hold] ipratropium-albuterol, [MAR Hold] oxyCODONE-acetaminophen  Review of Systems: Constitutional: Weight is stable.  Eyes: No changes in vision. ENT: No oral lesions, sore throat.  GI: see HPI.  Heme/Lymph: No easy bruising.  CV: No chest pain.  GU: No hematuria.  Integumentary: No rashes.  Neuro: No headaches.  Psych: No depression/anxiety.  Endocrine: No heat/cold intolerance.  Allergic/Immunologic: No urticaria.  Resp: No cough, SOB.  Musculoskeletal: No joint swelling.    Physical Examination: BP 119/74 mmHg  Pulse 77  Temp(Src) 104.1 F (40.1 C) (Oral)  Resp 20  Ht 6\' 3"  (1.905 m)  Wt 87.1 kg (192 lb 0.3 oz)  BMI 24.00 kg/m2  SpO2 93% Gen: NAD, alert and oriented x 4 HEENT: PEERLA, EOMI, Neck: supple, no JVD or thyromegaly Chest: Few wheezes and crackles, or other adventitious sounds CV: RRR, no m/g/c/r Abd: soft, NT,  ND, +BS in all four quadrants; no HSM, guarding, ridigity, or rebound tenderness Ext: no edema, well perfused with 2+ pulses, Skin: no rash or lesions noted Lymph: no LAD  Data: Lab Results  Component Value Date   WBC 8.1 05/04/2015   HGB 7.7* 05/04/2015   HCT 24.1* 05/04/2015   MCV 92.0 05/04/2015   PLT 238 05/04/2015    Recent Labs Lab 05/02/15 1106 05/03/15 0535 05/04/15 0611  HGB 7.3* 7.8* 7.7*   Lab Results  Component Value Date   NA 134* 05/02/2015   K 4.9 05/02/2015   CL 99* 05/02/2015   CO2 24 05/02/2015   BUN 56* 05/02/2015   CREATININE 7.38* 05/02/2015   Lab Results  Component Value Date   ALT 19 04/30/2015   AST 26 04/30/2015   ALKPHOS 128* 04/30/2015   BILITOT 0.6 04/30/2015    Recent Labs Lab 04/30/15 1703  APTT 28  INR 1.07   Assessment/Plan: Stanley [name] is a 55 y.o. male with anemia and melena.   Recommendations: With high fever and respiratory complaints, will have to cancel EGD for now. Hold until fever gone. I will be out at [place] on Wed. Thus, next time we can do it is either Thurs after HD, which anethesia will not let me do or Fri. May be better to arrange EGD as outpatient once current symptoms have resolved since there is no evidence of active bleeding. Thanks.  Please call with questions or concerns.  Simcha Farrington, Lupita Dawn, MD

## 2015-05-04 NOTE — Progress Notes (Signed)
[facility] NAME: Stanley [name]    MR#:  [id number]  DATE OF BIRTH:  06/26/1960  SUBJECTIVE:  Still spiking fever up to 101, 102 . planned to have EGD, but was canceled due to him running fever in last 24 hours REVIEW OF SYSTEMS:  CONSTITUTIONAL: ++ fever, fatigue and weakness.  EYES: No blurred or double vision.  EARS, NOSE, AND THROAT: No tinnitus or ear pain.  RESPIRATORY: ++ cough, NO shortness of breath, wheezing or hemoptysis.  CARDIOVASCULAR: No chest pain, orthopnea, edema.  GASTROINTESTINAL: No nausea, vomiting, diarrhea or abdominal pain. Dark-colored stools  GENITOURINARY: No dysuria, hematuria.  ENDOCRINE: No polyuria, nocturia,  HEMATOLOGY: No anemia, easy bruising or bleeding SKIN: No rash or lesion. MUSCULOSKELETAL: No joint pain or arthritis.   NEUROLOGIC: No tingling, numbness, weakness.  PSYCHIATRY: No anxiety or depression.   DRUG ALLERGIES:  No Known Allergies  VITALS:  Blood pressure 119/74, pulse 77, temperature 104.1 F (40.1 C), temperature source Oral, resp. rate 20, height 6\' 3"  (1.905 m), weight 87.1 kg (192 lb 0.3 oz), SpO2 93 %.  PHYSICAL EXAMINATION:  VITAL SIGNS: Filed Vitals:   05/04/15 1233 05/04/15 1437  BP: 119/71 119/74  Pulse: 82 77  Temp: 98.6 F (37 C) 104.1 F (40.1 C)  Resp: 68 20   GENERAL:55 y.o.male currently in no acute distress.  HEAD: Normocephalic, atraumatic.  EYES: Pupils equal, round, reactive to light. Extraocular muscles intact. No scleral icterus.  MOUTH: Moist mucosal membrane. Dentition intact. No abscess noted.  EAR, NOSE, THROAT: Clear without exudates. No external lesions.  NECK: Supple. No thyromegaly. No nodules. No JVD.  PULMONARY: Clear to ascultation, without wheeze rails or rhonci. No use of accessory muscles, Good respiratory effort. good air entry bilaterally CHEST: Nontender to palpation.  CARDIOVASCULAR: S1 and S2. Regular rate and rhythm.  No murmurs, rubs, or gallops. No edema. Pedal pulses 2+ bilaterally.  GASTROINTESTINAL: Soft, nontender, nondistended. No masses. Positive bowel sounds. No hepatosplenomegaly.  MUSCULOSKELETAL: No swelling, clubbing, or edema. Range of motion full in all extremities.  NEUROLOGIC: Cranial nerves II through XII are intact. No gross focal neurological deficits. Sensation intact. Reflexes intact.  SKIN: No ulceration, lesions, rashes, or cyanosis. Skin warm and dry. Turgor intact.  PSYCHIATRIC: Mood, affect within normal limits. The patient is awake, alert and oriented x 3. Insight, judgment intact.      LABORATORY PANEL:   CBC  Recent Labs Lab 05/04/15 0611  WBC 8.1  HGB 7.7*  HCT 24.1*  PLT 238   ------------------------------------------------------------------------------------------------------------------  Chemistries   Recent Labs Lab 04/30/15 1825  05/02/15 1106  NA 138  < > 134*  K 4.1  < > 4.9  CL 100*  < > 99*  CO2 33*  < > 24  GLUCOSE 109*  < > 108*  BUN 38*  < > 56*  CREATININE 4.91*  < > 7.38*  CALCIUM 9.6  < > 10.1  AST 26  --   --   ALT 19  --   --   ALKPHOS 128*  --   --   BILITOT 0.6  --   --   < > = values in this interval not displayed. ------------------------------------------------------------------------------------------------------------------  Cardiac Enzymes No results for input(s): TROPONINI in the last 168 hours. ------------------------------------------------------------------------------------------------------------------  RADIOLOGY:  Dg Chest 1 View  05/03/2015  CLINICAL DATA:  Cough.  Smoker. EXAM: CHEST 1 VIEW COMPARISON:  04/05/2015. FINDINGS: Stable enlarged cardiac silhouette and post  CABG changes. Mild linear scarring in the left lower lung zone. Otherwise, clear lungs with normal vascularity. No pleural fluid. Unremarkable bones. IMPRESSION: No acute abnormality.  Stable cardiomegaly. Electronically Signed   By: Claudie Revering  M.D.   On: 05/03/2015 11:22   Dg Chest 2 View  05/04/2015  CLINICAL DATA:  Fever. Weakness. Fluid volume excess. End-stage renal disease on dialysis. Coronary artery disease EXAM: CHEST  2 VIEW COMPARISON:  05/03/2015 FINDINGS: Moderate cardiomegaly remains stable. No evidence of acute infiltrate or edema. No evidence pleural effusion. Prior CABG again noted. IMPRESSION: Stable cardiomegaly.  No active lung disease. Electronically Signed   By: Earle Gell M.D.   On: 05/04/2015 09:56    EKG:     ASSESSMENT AND PLAN:   55 year old gentleman history end-stage renal disease on hemodialysis presenting with anemia secondary to GI bleed  1. Symptomatic anemia secondary to acute on chronic upper GI bleed: Status post 2 unit transfusion, CBC stable. (Patient had normal EGD in January with colon polyp on colonoscopy) Continue PPI. She canceled as patient was still running fever last 24 hours   2. FEVER: Check blood cultures 2., Chest x-ray and urinalysis. Heart Empiric Zosyn and vancomycin. Test for influenza as well. Start him on Tamiflu empirically Discussed with infectious disease Dr. Ola Spurr He recommends to Consider transfusion reaction as cause. - Nurse to notify blood bank. discussed with nursing Check HIV Check sputum cx Cont vanco and zosyn pending cx results - Abd USS to eval for ascites and if fevers persist would do paracentesis.  3 [name]: Nephrology consult appreciated. Continue dialysis as per nephrology. 4. Coronary artery disease status post bypass no angina: Aspirin on hold Due to problem #1.  Continue atorvastatin, Benzapril, labetalol  5. Essential hypertension:Continuel  and Benzapril 6. Hyperlipidemia unspecified:  Continue atorvastatin     Venous thromboembolism prophylactic: SCDs     Management plans discussed with the patient  CODE STATUS: Full  TOTAL TIME TAKING CARE OF THIS PATIENT: 29 minutes.  Discussed with Dr.  Ola Spurr  POSSIBLE D/C 1-2 days DEPENDING ON CLINICAL CONDITION.   [facility], Amrie Gurganus M.D on 05/04/2015 at 5:44 PM  Between 7am to 6pm - Pager - [phone]  After 6pm: House Pager: - [phone]  [facility]  Office  [phone]  CC: Primary care physician; Elyse Jarvis, MD

## 2015-05-04 NOTE — Progress Notes (Signed)
Patient on HD. Tamiflu dose changed to 30mg  once then 30mg  AFTER each HD session for a total of 5 days from the first dose.  Ramond Dial, Pharm.D Clinical Pharmacist

## 2015-05-04 NOTE — Op Note (Signed)
[facility] Patient Name: Stanley [name] Procedure Date: 04/11/2014 9:11 AM MRN: [id number] Account #: [id number] Date of Birth: 1960-12-15 Admit Type: Outpatient Age: 55 Room: Room 4                    Gender: Male Note Status: Supervisor Override Procedure:            Colonoscopy Indications:          Incidental - Melena, Incidental - Iron deficiency                        anemia, Family hx of colon cancer Providers:            Lupita Dawn. Candace Cruise, MD Medicines:            Monitored Anesthesia Care Complications:        No immediate complications. Procedure:            Pre-Anesthesia Assessment:                       - Prior to the procedure, a History and Physical was                        performed, and patient medications, allergies and                        sensitivities were reviewed. The patient's tolerance of                        previous anesthesia was reviewed.                       - The risks and benefits of the procedure and the                        sedation options and risks were discussed with the                        patient. All questions were answered and informed                        consent was obtained.                       - After reviewing the risks and benefits, the patient                        was deemed in satisfactory condition to undergo the                        procedure.                       After obtaining informed consent, the colonoscope was                        passed under direct vision. Throughout the procedure,                        the patient's blood pressure, pulse, and oxygen  saturations were monitored continuously. The                        Colonoscope was introduced through the anus and                        advanced to the the cecum, identified by appendiceal                        orifice and ileocecal valve. The colonoscopy was                        performed  without difficulty. The patient tolerated the                        procedure well. The quality of the bowel preparation                        was good. Findings:      A medium polyp was found in the transverse colon. The polyp was sessile.       The polyp was removed with a hot snare. Resection and retrieval were       complete.      The exam was otherwise without abnormality.      No bleeding anywhere.      The exam was otherwise without abnormality. Impression:           - One medium polyp in the transverse colon. Resected                        and retrieved.                       - The examination was otherwise normal.                       - The examination was otherwise normal.                       - May have anemia of chronic disease. Recommendation:       - Discharge patient to home.                       - Await pathology results.                       - Repeat colonoscopy in 3 years for surveillance based                        on pathology results.                       - The findings and recommendations were discussed with                        the patient. Procedure Code(s):    --- Professional ---                       [phone], Colonoscopy, flexible; with removal of tumor(s),                        polyp(s), or other  lesion(s) by snare technique Diagnosis Code(s):    --- Professional ---                       Z80.0, Family history of malignant neoplasm of                        digestive organs                       D12.3, Benign neoplasm of transverse colon CPT copyright 2016 American Medical Association. All rights reserved. The codes documented in this report are preliminary and upon coder review may  be revised to meet current compliance requirements. Hulen Luster, MD 04/11/2014 9:50:29 AM This report has been signed electronically. Number of Addenda: 0 Note Initiated On: 04/11/2014 9:11 AM Scope Withdrawal Time: 0 hours 3 minutes 13 seconds  Total Procedure  Duration: 0 hours 7 minutes 51 seconds       [facility]

## 2015-05-04 NOTE — Care Management Important Message (Signed)
Important Message  Patient Details  Name: Stanley [name] MRN: [id number] Date of Birth: 1961-03-01   Medicare Important Message Given:  Yes    Juliann Pulse A Asal Teas 05/04/2015, 10:21 AM

## 2015-05-05 ENCOUNTER — Other Ambulatory Visit: Payer: Self-pay | Admitting: Oncology

## 2015-05-05 ENCOUNTER — Inpatient Hospital Stay: Payer: Medicare Other

## 2015-05-05 ENCOUNTER — Other Ambulatory Visit: Payer: Self-pay | Admitting: Pathology

## 2015-05-05 LAB — CBC
HEMATOCRIT: 22.8 % — AB (ref 40.0–52.0)
Hemoglobin: 7.1 g/dL — ABNORMAL LOW (ref 13.0–18.0)
MCH: 28.7 pg (ref 26.0–34.0)
MCHC: 31.1 g/dL — ABNORMAL LOW (ref 32.0–36.0)
MCV: 92.3 fL (ref 80.0–100.0)
PLATELETS: 211 10*3/uL (ref 150–440)
RBC: 2.47 MIL/uL — AB (ref 4.40–5.90)
RDW: 19.6 % — ABNORMAL HIGH (ref 11.5–14.5)
WBC: 6 10*3/uL (ref 3.8–10.6)

## 2015-05-05 MED ORDER — SODIUM CHLORIDE 0.9 % IV SOLN: Freq: Once | INTRAVENOUS | Status: DC

## 2015-05-05 NOTE — Progress Notes (Signed)
Subjective:  Patient is doing poorly He has a fever.  MAXIMUM TEMPERATURE 104.1 Reports decreased appetite No shortness of breath   Objective:  Vital signs in last 24 hours:  Temp:  [98.1 F (36.7 C)-104.1 F (40.1 C)] 98.1 F (36.7 C) (02/21 1155) Pulse Rate:  [68-79] 79 (02/21 1400) Resp:  [12-24] 20 (02/21 1400) BP: (104-171)/(61-97) 128/73 mmHg (02/21 1400) SpO2:  [93 %-100 %] 100 % (02/21 1155)  Weight change:  Filed Weights   04/30/15 1649 04/30/15 2009 05/02/15 1100  Weight: 81.647 kg (180 lb) 80.015 kg (176 lb 6.4 oz) 87.1 kg (192 lb 0.3 oz)    Intake/Output:    Intake/Output Summary (Last 24 hours) at 05/05/15 1407 Last data filed at 05/05/15 1206  Gross per 24 hour  Intake     50 ml  Output    300 ml  Net   -250 ml     Physical Exam: General: Laying in the bed  HEENT Moist mucous membranes  Neck supple  Pulm/lungs Normal respiratory effort, clear  CVS/Heart No rub or gallop  Abdomen:  Mildly distended, nontender, soft  Extremities: trace edema  Neurologic: alert  Skin: No acute rashes  Access: AV fistula       Basic Metabolic Panel:   Recent Labs Lab 04/30/15 1825 05/01/15 0341 05/02/15 1106  NA 138 138 134*  K 4.1 4.4 4.9  CL 100* 100* 99*  CO2 33* 29 24  GLUCOSE 109* 75 108*  BUN 38* 43* 56*  CREATININE 4.91* 5.47* 7.38*  CALCIUM 9.6 9.9 10.1  PHOS  --   --  6.9*     CBC:  Recent Labs Lab 04/30/15 1712  05/01/15 0959 05/02/15 1106 05/03/15 0535 05/04/15 0611 05/05/15 1244  WBC 6.2  < > 6.4 6.5 7.3 8.1 6.0  NEUTROABS 4.4  --   --   --   --   --   --   HGB 5.7*  < > 7.2* 7.3* 7.8* 7.7* 7.1*  HCT 18.6*  < > 22.8* 22.7* 24.4* 24.1* 22.8*  MCV 96.6  < > 92.6 91.9 95.3 92.0 92.3  PLT 319  < > 295 290 275 238 211  < > = values in this interval not displayed.    Microbiology:  Recent Results (from the past 720 hour(s))  MRSA PCR Screening     Status: None   Collection Time: 05/01/15  1:36 AM  Result Value Ref Range  Status   MRSA by PCR NEGATIVE NEGATIVE Final    Comment:        The GeneXpert MRSA Assay (FDA approved for NASAL specimens only), is one component of a comprehensive MRSA colonization surveillance program. It is not intended to diagnose MRSA infection nor to guide or monitor treatment for MRSA infections.   Culture, blood (Routine X 2) w Reflex to ID Panel     Status: None (Preliminary result)   Collection Time: 05/03/15  8:29 AM  Result Value Ref Range Status   Specimen Description BLOOD LEFT ARM  Final   Special Requests BOTTLES DRAWN AEROBIC AND ANAEROBIC  1CC  Final   Culture NO GROWTH 2 DAYS  Final   Report Status PENDING  Incomplete  Culture, blood (Routine X 2) w Reflex to ID Panel     Status: None (Preliminary result)   Collection Time: 05/03/15  8:44 AM  Result Value Ref Range Status   Specimen Description BLOOD LEFT HAND  Final   Special Requests   Final  BOTTLES DRAWN AEROBIC AND ANAEROBIC  AER 6CC ANA 3CC   Culture NO GROWTH 2 DAYS  Final   Report Status PENDING  Incomplete  MRSA PCR Screening     Status: None   Collection Time: 05/04/15 11:24 AM  Result Value Ref Range Status   MRSA by PCR NEGATIVE NEGATIVE Final    Comment:        The GeneXpert MRSA Assay (FDA approved for NASAL specimens only), is one component of a comprehensive MRSA colonization surveillance program. It is not intended to diagnose MRSA infection nor to guide or monitor treatment for MRSA infections.     Coagulation Studies: No results for input(s): LABPROT, INR in the last 72 hours.  Urinalysis:  Recent Labs  05/03/15 0815  COLORURINE YELLOW*  LABSPEC 1.008  PHURINE 9.0*  GLUCOSEU NEGATIVE  HGBUR NEGATIVE  BILIRUBINUR NEGATIVE  KETONESUR NEGATIVE  PROTEINUR 100*  NITRITE NEGATIVE  LEUKOCYTESUR NEGATIVE      Imaging: Dg Chest 2 View  05/04/2015  CLINICAL DATA:  Fever. Weakness. Fluid volume excess. End-stage renal disease on dialysis. Coronary artery disease EXAM:  CHEST  2 VIEW COMPARISON:  05/03/2015 FINDINGS: Moderate cardiomegaly remains stable. No evidence of acute infiltrate or edema. No evidence pleural effusion. Prior CABG again noted. IMPRESSION: Stable cardiomegaly.  No active lung disease. Electronically Signed   By: Earle Gell M.D.   On: 05/04/2015 09:56   US Abdomen Complete  05/05/2015  CLINICAL DATA:  Fevers EXAM: ABDOMEN ULTRASOUND COMPLETE COMPARISON:  None FINDINGS: Gallbladder: No gallstones or wall thickening visualized. No sonographic Murphy sign noted by sonographer. Common bile duct: Diameter: 4.0 mm Liver: No focal lesion identified. Within normal limits in parenchymal echogenicity. IVC: No abnormality visualized. Pancreas: Not well visualized due to overlying bowel gas. Spleen: Size and appearance within normal limits. Right Kidney: Length: 8.4 cm. Increased echogenicity is noted consistent with medical renal disease. Multiple cysts are seen. The largest of these lies in the lower pole measuring 1.3 cm. Left Kidney: Length: 9.6 cm. Increased echogenicity is noted similar to that noted on the right. Cystic lesions are noted. The largest of these measures 2 cm in the lower pole. Abdominal aorta: No aneurysm visualized. Other findings: Mild to moderate ascites is noted. IMPRESSION: Mild to moderate ascites. Changes consistent with medical renal disease No other focal abnormality is noted. Electronically Signed   By: Inez Catalina M.D.   On: 05/05/2015 09:33     Medications:     . atorvastatin  40 mg Oral Daily  . benazepril  10 mg Oral Daily  . calcium acetate  2,001 mg Oral TID WC  . cinacalcet  60 mg Oral Q supper  . furosemide  40 mg Oral Daily  . gabapentin  300 mg Oral Daily  . labetalol  200 mg Oral Daily  . multivitamin with minerals  1 tablet Oral Daily  . oseltamivir  30 mg Oral Once per day on Tue Thu Sat  . pantoprazole (PROTONIX) IV  40 mg Intravenous Q12H  . piperacillin-tazobactam (ZOSYN)  IV  3.375 g Intravenous Q12H  .  vancomycin  1,000 mg Intravenous Q T,Th,Sa-HD   acetaminophen, ipratropium-albuterol, oxyCODONE-acetaminophen  Assessment/ Plan:  55 y.o.african American male  with end-stage renal disease, dialysis at [facility], coronary disease, hypertension, history of alcohol abuse, history of drug abuse  [facility] [address] TTS  1. End-stage renal disease:  - continue TTS schedule.  - Patient seen during dialysis Tolerating well    HEMODIALYSIS  FLOWSHEET:  Blood Flow Rate (mL/min): 400 mL/min Arterial Pressure (mmHg): -120 mmHg Venous Pressure (mmHg): 170 mmHg Transmembrane Pressure (mmHg): 40 mmHg Ultrafiltration Rate (mL/min): 570 mL/min Dialysate Flow Rate (mL/min): 800 ml/min Conductivity: Machine : 14 Conductivity: Machine : 14 Dialysis Fluid Bolus: Normal Saline Bolus Amount (mL): 250 mL (Prime given) Intra-Hemodialysis Comments: 1174ml rremoved, pt stable    2. Anemia of chronic kidney disease and recent GI bleed.  -  Status post 2 units PRBC on 2/16. Colonoscopy done on 1/29.  -  epo with HD treatment. -  EGD planned  3. Secondary hyperparathyroidism: - monitor ca/phos during hospitalization  4. Ascites from liver cirrhosis: status post paracentesis on 04/06/15.   5. Fever - low grade today   LOS: 5 [place] Agent 2/21/20172:07 PM

## 2015-05-05 NOTE — Progress Notes (Addendum)
Per Dr. Manuella Ghazi okay to transfuse 1 unit of blood if after 1 hour pts temp has dropped less that 100. If pts temp has not dropped call prime doc for further instructions. If pt does have temp spike during transfusion notify blood bank to test for transfusion reaction.

## 2015-05-05 NOTE — Progress Notes (Signed)
HPI: 56 year old gentleman history end-stage renal disease on hemodialysis who presented with anemia secondary to GI bleed; hemoglobin 5.7. He received 2 units of PRBC on 04/30/2015. While his hemoglobin has stabilized he continues to have fevers up to 103.1 F. ID workup has been unrevealing to date. Given the patient's fever and recent transfusion a request for blood transfusion reaction investigation was made.   Laboratory investigation: Clerical check was performed with no issues identified. Pre and post transfusion antibody screens are negative. DAT is negative.  Interpretation: A transfusion reaction is not identified.   Recommendations: Transfuse as clinically indicated.

## 2015-05-05 NOTE — Progress Notes (Deleted)
HPI: 55 year old gentleman history end-stage renal disease on hemodialysis who presented with anemia secondary to GI bleed; hemoglobin 5.7. He received 2 units of PRBC on 04/30/2015. While his hemoglobin has stabilized he continues to have fevers up to 103.1 F. ID workup has been unrevealing to date. Given the patient's fever and recent transfusion a request for blood transfusion reaction investigation was made.   Laboratory investigation: Clerical check was performed with no issues identified. Pre and post transfusion antibody screens are negative. DAT is negative.  Interpretation: A transfusion reaction is not identified.   Recommendations: Transfuse as clinically indicated.

## 2015-05-05 NOTE — Progress Notes (Signed)
Initial Nutrition Assessment     INTERVENTION:  Meals and snacks: Cater to pt preferences Nutrition diet education: Discussed dialysis diet restrictions and pt reports RD speaks with him at dialysis about diet.  No education needed at this time   NUTRITION DIAGNOSIS:    (none at this time) related to   as evidenced by  .    GOAL:   Patient will meet greater than or equal to 90% of their needs    MONITOR:    (Energy intake, Electrolyte and renal profile)  REASON FOR ASSESSMENT:    (dialysis pt)    ASSESSMENT:      Pt admitted with anemia, GI bleed  Past Medical History  Diagnosis Date  . Coronary artery disease   . Hypertension   . GERD (gastroesophageal reflux disease)   . Suicidal ideation     & HOMICIDAL IDEATION --  06-16-2013   ADMITTED TO BEHAVIOR HEALTH  . Alcohol abuse   . Drug abuse   . Diabetic peripheral neuropathy ([place])   . End stage renal disease on dialysis [facility]) NEPHROLOGIST-   [facility]  IN [place]    HEMODIALYSIS --   TUES/  THURS/  SAT  . Cirrhosis ([place])     Current Nutrition: eating mostly 100% of meals per I and O sheet. Pt reports good appetite   Food/Nutrition-Related History: pt reports decreased intake for about 4 days prior to admission, otherwise normal intake   Scheduled Medications:  . atorvastatin  40 mg Oral Daily  . benazepril  10 mg Oral Daily  . calcium acetate  2,001 mg Oral TID WC  . cinacalcet  60 mg Oral Q supper  . furosemide  40 mg Oral Daily  . gabapentin  300 mg Oral Daily  . labetalol  200 mg Oral Daily  . multivitamin with minerals  1 tablet Oral Daily  . oseltamivir  30 mg Oral Once per day on Tue Thu Sat  . pantoprazole (PROTONIX) IV  40 mg Intravenous Q12H  . piperacillin-tazobactam (ZOSYN)  IV  3.375 g Intravenous Q12H  . vancomycin  1,000 mg Intravenous Q T,Th,Sa-HD     Electrolyte/Renal Profile and Glucose Profile:   Recent Labs Lab 04/30/15 1825 05/01/15 0341 05/02/15 1106  NA 138 138  134*  K 4.1 4.4 4.9  CL 100* 100* 99*  CO2 33* 29 24  BUN 38* 43* 56*  CREATININE 4.91* 5.47* 7.38*  CALCIUM 9.6 9.9 10.1  PHOS  --   --  6.9*  GLUCOSE 109* 75 108*   Protein Profile:  Recent Labs Lab 04/30/15 1825 05/02/15 1106  ALBUMIN 3.0* 2.9*    Gastrointestinal Profile: Last BM:2/18     Weight Change: stable wt prior to admission    Diet Order:  Diet renal/carb modified with fluid restriction Diet-HS Snack?: Nothing; Room service appropriate?: Yes; Fluid consistency:: Thin  Skin:   reviewed   Height:   Ht Readings from Last 1 Encounters:  04/30/15 6\' 3"  (1.905 m)    Weight:   Wt Readings from Last 1 Encounters:  05/02/15 192 lb 0.3 oz (87.1 kg)    Ideal Body Weight:     BMI:  Body mass index is 24 kg/(m^2).  EDUCATION NEEDS:   No education needs identified at this time  LOW Care Level  Brycen Bean B. Zenia Resides, [place], [place] (pager) Weekend/On-Call pager [phone])

## 2015-05-05 NOTE — Progress Notes (Signed)
[facility] [place] NAME: Stanley [name]    MR#:  [id number]  DATE OF BIRTH:  February 19, 1961  SUBJECTIVE:  Not having any more high-grade fever, although is not feeling good, just not able to describe what's strong REVIEW OF SYSTEMS:  CONSTITUTIONAL: ++ fever, fatigue and weakness.  EYES: No blurred or double vision.  EARS, NOSE, AND THROAT: No tinnitus or ear pain.  RESPIRATORY: ++ cough, NO shortness of breath, wheezing or hemoptysis.  CARDIOVASCULAR: No chest pain, orthopnea, edema.  GASTROINTESTINAL: No nausea, vomiting, diarrhea or abdominal pain. Dark-colored stools  GENITOURINARY: No dysuria, hematuria.  ENDOCRINE: No polyuria, nocturia,  HEMATOLOGY: No anemia, easy bruising or bleeding SKIN: No rash or lesion. MUSCULOSKELETAL: No joint pain or arthritis.   NEUROLOGIC: No tingling, numbness, weakness.  PSYCHIATRY: No anxiety or depression.   DRUG ALLERGIES:  No Known Allergies  VITALS:  Blood pressure 131/73, pulse 90, temperature 100 F (37.8 C), temperature source Oral, resp. rate 18, height 6\' 3"  (1.905 m), weight 87.1 kg (192 lb 0.3 oz), SpO2 94 %.  PHYSICAL EXAMINATION:  VITAL SIGNS: Filed Vitals:   05/05/15 1538 05/05/15 1619  BP: 134/81 131/73  Pulse: 89 90  Temp:  100 F (37.8 C)  Resp: 23 11   GENERAL:54 y.o.male currently in no acute distress.  HEAD: Normocephalic, atraumatic.  EYES: Pupils equal, round, reactive to light. Extraocular muscles intact. No scleral icterus.  MOUTH: Moist mucosal membrane. Dentition intact. No abscess noted.  EAR, NOSE, THROAT: Clear without exudates. No external lesions.  NECK: Supple. No thyromegaly. No nodules. No JVD.  PULMONARY: Clear to ascultation, without wheeze rails or rhonci. No use of accessory muscles, Good respiratory effort. good air entry bilaterally CHEST: Nontender to palpation.  CARDIOVASCULAR: S1 and S2. Regular rate and rhythm. No murmurs, rubs, or  gallops. No edema. Pedal pulses 2+ bilaterally.  GASTROINTESTINAL: Soft, nontender, nondistended. No masses. Positive bowel sounds. No hepatosplenomegaly.  MUSCULOSKELETAL: No swelling, clubbing, or edema. Range of motion full in all extremities.  NEUROLOGIC: Cranial nerves II through XII are intact. No gross focal neurological deficits. Sensation intact. Reflexes intact.  SKIN: No ulceration, lesions, rashes, or cyanosis. Skin warm and dry. Turgor intact.  PSYCHIATRIC: Mood, affect within normal limits. The patient is awake, alert and oriented x 3. Insight, judgment intact.   LABORATORY PANEL:   CBC  Recent Labs Lab 05/05/15 1244  WBC 6.0  HGB 7.1*  HCT 22.8*  PLT 211   ------------------------------------------------------------------------------------------------------------------  Chemistries   Recent Labs Lab 04/30/15 1825  05/02/15 1106  NA 138  < > 134*  K 4.1  < > 4.9  CL 100*  < > 99*  CO2 33*  < > 24  GLUCOSE 109*  < > 108*  BUN 38*  < > 56*  CREATININE 4.91*  < > 7.38*  CALCIUM 9.6  < > 10.1  AST 26  --   --   ALT 19  --   --   ALKPHOS 128*  --   --   BILITOT 0.6  --   --   < > = values in this interval not displayed. ------------------------------------------------------------------------------------------------------------------  Cardiac Enzymes No results for input(s): TROPONINI in the last 168 hours. ------------------------------------------------------------------------------------------------------------------  RADIOLOGY:  Dg Chest 2 View  05/04/2015  CLINICAL DATA:  Fever. Weakness. Fluid volume excess. End-stage renal disease on dialysis. Coronary artery disease EXAM: CHEST  2 VIEW COMPARISON:  05/03/2015 FINDINGS: Moderate cardiomegaly remains stable. No evidence of  acute infiltrate or edema. No evidence pleural effusion. Prior CABG again noted. IMPRESSION: Stable cardiomegaly.  No active lung disease. Electronically Signed   By: Earle Gell M.D.    On: 05/04/2015 09:56   US Abdomen Complete  05/05/2015  CLINICAL DATA:  Fevers EXAM: ABDOMEN ULTRASOUND COMPLETE COMPARISON:  None FINDINGS: Gallbladder: No gallstones or wall thickening visualized. No sonographic Murphy sign noted by sonographer. Common bile duct: Diameter: 4.0 mm Liver: No focal lesion identified. Within normal limits in parenchymal echogenicity. IVC: No abnormality visualized. Pancreas: Not well visualized due to overlying bowel gas. Spleen: Size and appearance within normal limits. Right Kidney: Length: 8.4 cm. Increased echogenicity is noted consistent with medical renal disease. Multiple cysts are seen. The largest of these lies in the lower pole measuring 1.3 cm. Left Kidney: Length: 9.6 cm. Increased echogenicity is noted similar to that noted on the right. Cystic lesions are noted. The largest of these measures 2 cm in the lower pole. Abdominal aorta: No aneurysm visualized. Other findings: Mild to moderate ascites is noted. IMPRESSION: Mild to moderate ascites. Changes consistent with medical renal disease No other focal abnormality is noted. Electronically Signed   By: Inez Catalina M.D.   On: 05/05/2015 09:33    EKG:     ASSESSMENT AND PLAN:   55 year old gentleman history end-stage renal disease on hemodialysis presenting with anemia secondary to GI bleed  1. Symptomatic anemia secondary to acute on chronic upper GI bleed: Status post 2 unit transfusion, CBC slowly trending down hemoglobin, hemoglobin is 7.1 (Patient had normal EGD in January with colon polyp on colonoscopy) Continue PPI. EGD canceled as patient was still running fever last 24 hours - if still here.  May get it on Friday - We will go and transfuse 1 more unit of packed red blood cells with hemoglobin of 7.1.    2. Fever: all w/up neg thus far. continue Empiric Zosyn and vancomycin. - on Tamiflu empirically - infectious disease Dr. Ola Spurr following Check HIV Check sputum cx - Abd USS showed  mild to moderate ascites  3 [name]: Nephrology consult appreciated. Continue dialysis as per nephrology. 4. Coronary artery disease status post bypass no angina: Aspirin on hold Due to problem #1.  Continue atorvastatin, Benzapril, labetalol  5. Essential hypertension:Continuel  and Benzapril 6. Hyperlipidemia unspecified:  Continue atorvastatin     Venous thromboembolism prophylactic: SCDs     Management plans discussed with the patient  CODE STATUS: Full  TOTAL TIME TAKING CARE OF THIS PATIENT: 29 minutes.  Discussed with Dr. Ola Spurr  POSSIBLE D/C 1-2 days DEPENDING ON CLINICAL CONDITION.   [facility], Duwayne Matters M.D on 05/05/2015 at 5:45 PM  Between 7am to 6pm - Pager - [phone]  After 6pm: House Pager: - [phone]  [facility]  Office  [phone]  CC: Primary care physician; Elyse Jarvis, MD

## 2015-05-05 NOTE — Progress Notes (Signed)
[facility] INFECTIOUS DISEASE PROGRESS NOTE Date of Admission:  04/30/2015     ID: Jonathon Conger is a 55 y.o. male with fevers  Principal Problem:   Symptomatic anemia Active Problems:   GIB (gastrointestinal bleeding)   Subjective: Defervescing, but feels terrible, like fever coming back. No localizing sxs  ROS  Eleven systems are reviewed and negative except per hpi  Medications:  Antibiotics Given (last 72 hours)    Date/Time Action Medication Dose Rate   05/03/15 1027 Given   piperacillin-tazobactam (ZOSYN) IVPB 3.375 g 3.375 g 12.5 mL/hr   05/03/15 1027 Given   vancomycin (VANCOCIN) 2,000 mg in sodium chloride 0.9 % 500 mL IVPB 2,000 mg 250 mL/hr   05/03/15 2150 Given   piperacillin-tazobactam (ZOSYN) IVPB 3.375 g 3.375 g 12.5 mL/hr   05/04/15 1045 Given   piperacillin-tazobactam (ZOSYN) IVPB 3.375 g 3.375 g 12.5 mL/hr   05/04/15 1637 Given   oseltamivir (TAMIFLU) capsule 30 mg 30 mg    05/04/15 2248 Given   piperacillin-tazobactam (ZOSYN) IVPB 3.375 g 3.375 g 12.5 mL/hr   05/05/15 1017 Given   piperacillin-tazobactam (ZOSYN) IVPB 3.375 g 3.375 g 12.5 mL/hr     . atorvastatin  40 mg Oral Daily  . benazepril  10 mg Oral Daily  . calcium acetate  2,001 mg Oral TID WC  . cinacalcet  60 mg Oral Q supper  . furosemide  40 mg Oral Daily  . gabapentin  300 mg Oral Daily  . labetalol  200 mg Oral Daily  . multivitamin with minerals  1 tablet Oral Daily  . oseltamivir  30 mg Oral Once per day on Tue Thu Sat  . pantoprazole (PROTONIX) IV  40 mg Intravenous Q12H  . piperacillin-tazobactam (ZOSYN)  IV  3.375 g Intravenous Q12H  . vancomycin  1,000 mg Intravenous Q T,Th,Sa-HD    Objective: Vital signs in last 24 hours: Temp:  [98.1 F (36.7 C)-99.5 F (37.5 C)] 99.5 F (37.5 C) (02/21 1530) Pulse Rate:  [68-89] 89 (02/21 1538) Resp:  [12-24] 23 (02/21 1538) BP: (104-171)/(61-97) 134/81 mmHg (02/21 1538) SpO2:  [99 %-100 %] 100 % (02/21 1155) Constitutional:  He is oriented to person, place, and time. He appears well-developed and well-nourished. No distress.  HENT: muddy sclera Mouth/Throat: Oropharynx is clear and moist. No oropharyngeal exudate.  Cardiovascular: Normal rate, regular rhythm and normal heart sounds. 2/6 sm Pulmonary/Chest:bil exp wheeze, and rhonchi Abdominal: Soft. Bowel sounds are normal. He exhibits mild distension. There is mild tenderness.  Lymphadenopathy: He has some shoddy cervical adenopathy.  Neurological: He is alert and oriented to person, place, and time.  Skin: Skin is warm and dry. No rash noted. No erythema.  Psychiatric: He has a normal mood and affect. His behavior is normal.  RUE AVG site wnl  Lab Results  Recent Labs  05/04/15 0611 05/05/15 1244  WBC 8.1 6.0  HGB 7.7* 7.1*  HCT 24.1* 22.8*    Microbiology: Results for orders placed or performed during the hospital encounter of 04/30/15  MRSA PCR Screening     Status: None   Collection Time: 05/01/15  1:36 AM  Result Value Ref Range Status   MRSA by PCR NEGATIVE NEGATIVE Final    Comment:        The GeneXpert MRSA Assay (FDA approved for NASAL specimens only), is one component of a comprehensive MRSA colonization surveillance program. It is not intended to diagnose MRSA infection nor to guide or monitor treatment for MRSA infections.  Culture, blood (Routine X 2) w Reflex to ID Panel     Status: None (Preliminary result)   Collection Time: 05/03/15  8:29 AM  Result Value Ref Range Status   Specimen Description BLOOD LEFT ARM  Final   Special Requests BOTTLES DRAWN AEROBIC AND ANAEROBIC  1CC  Final   Culture NO GROWTH 2 DAYS  Final   Report Status PENDING  Incomplete  Culture, blood (Routine X 2) w Reflex to ID Panel     Status: None (Preliminary result)   Collection Time: 05/03/15  8:44 AM  Result Value Ref Range Status   Specimen Description BLOOD LEFT HAND  Final   Special Requests   Final    BOTTLES DRAWN AEROBIC AND  ANAEROBIC  AER 6CC ANA 3CC   Culture NO GROWTH 2 DAYS  Final   Report Status PENDING  Incomplete  MRSA PCR Screening     Status: None   Collection Time: 05/04/15 11:24 AM  Result Value Ref Range Status   MRSA by PCR NEGATIVE NEGATIVE Final    Comment:        The GeneXpert MRSA Assay (FDA approved for NASAL specimens only), is one component of a comprehensive MRSA colonization surveillance program. It is not intended to diagnose MRSA infection nor to guide or monitor treatment for MRSA infections.      Studies/Results: Dg Chest 2 View  05/04/2015  CLINICAL DATA:  Fever. Weakness. Fluid volume excess. End-stage renal disease on dialysis. Coronary artery disease EXAM: CHEST  2 VIEW COMPARISON:  05/03/2015 FINDINGS: Moderate cardiomegaly remains stable. No evidence of acute infiltrate or edema. No evidence pleural effusion. Prior CABG again noted. IMPRESSION: Stable cardiomegaly.  No active lung disease. Electronically Signed   By: Earle Gell M.D.   On: 05/04/2015 09:56   US Abdomen Complete  05/05/2015  CLINICAL DATA:  Fevers EXAM: ABDOMEN ULTRASOUND COMPLETE COMPARISON:  None FINDINGS: Gallbladder: No gallstones or wall thickening visualized. No sonographic Murphy sign noted by sonographer. Common bile duct: Diameter: 4.0 mm Liver: No focal lesion identified. Within normal limits in parenchymal echogenicity. IVC: No abnormality visualized. Pancreas: Not well visualized due to overlying bowel gas. Spleen: Size and appearance within normal limits. Right Kidney: Length: 8.4 cm. Increased echogenicity is noted consistent with medical renal disease. Multiple cysts are seen. The largest of these lies in the lower pole measuring 1.3 cm. Left Kidney: Length: 9.6 cm. Increased echogenicity is noted similar to that noted on the right. Cystic lesions are noted. The largest of these measures 2 cm in the lower pole. Abdominal aorta: No aneurysm visualized. Other findings: Mild to moderate ascites is  noted. IMPRESSION: Mild to moderate ascites. Changes consistent with medical renal disease No other focal abnormality is noted. Electronically Signed   By: Inez Catalina M.D.   On: 05/05/2015 09:33    Assessment/Plan: Kaiyon Birke is a 55 y.o. male with a known history of coronary artery disease status post CABG, hypertension, gastroesophageal reflux disease, alcohol abuse and liver cirrhosis, end-stage renal disease on hemodialysis-admitted with low hgb from HD. WBC on admit was 6.4. He has since developed fevers to 104, starting 2/18, started vanco and zosyn 2/19 after bcx done. Flu negative, CXR negative. WBC only 8. L:FTs nml on admit. UA negative. Did receive blood 2/16 and 2/17.  His sxs of body aches, cough and HA are consistent with resp infection. No evidence of AVG infection although bacteremia always a possibility in HD patient.  SBP possibility as well -  had paracentesis of 5 L in Mar 24 2015 with only 103 wbc and 1 % PMNS.  [name] this admit with only mild- mod ascites. HIV negative, no evidence transfusion reaction Recommendations Check sputum cx Cont vanco and zosyn pending cx results Repeat bcx if febrile Consider CT chest   Thank you very much for the consult. Will follow with you.  [place], [place]   05/05/2015, 4:03 PM

## 2015-05-05 NOTE — Progress Notes (Signed)
Per Dr. Benjie Karvonen do not administer unit of blood previously ordered as pt had temperature increase to 100.4 instead of going down after dose of tylenol. Will reassess in the morning. Will discontinue order per MD.

## 2015-05-05 NOTE — Progress Notes (Signed)
Spoke to Dr. Manuella Ghazi okay to discontinue order for urine to be collected to test for transfusion reaction.

## 2015-05-06 LAB — CBC
HEMATOCRIT: 23.2 % — AB (ref 40.0–52.0)
HEMATOCRIT: 25.4 % — AB (ref 40.0–52.0)
HEMOGLOBIN: 7.4 g/dL — AB (ref 13.0–18.0)
Hemoglobin: 8 g/dL — ABNORMAL LOW (ref 13.0–18.0)
MCH: 29.3 pg (ref 26.0–34.0)
MCH: 28.7 pg (ref 26.0–34.0)
MCHC: 31.7 g/dL — AB (ref 32.0–36.0)
MCHC: 31.4 g/dL — ABNORMAL LOW (ref 32.0–36.0)
MCV: 92.4 fL (ref 80.0–100.0)
MCV: 91.3 fL (ref 80.0–100.0)
Platelets: 199 10*3/uL (ref 150–440)
Platelets: 204 10*3/uL (ref 150–440)
RBC: 2.51 MIL/uL — AB (ref 4.40–5.90)
RBC: 2.78 MIL/uL — AB (ref 4.40–5.90)
RDW: 19.4 % — ABNORMAL HIGH (ref 11.5–14.5)
RDW: 19.9 % — AB (ref 11.5–14.5)
WBC: 3.8 10*3/uL (ref 3.8–10.6)
WBC: 5.7 10*3/uL (ref 3.8–10.6)

## 2015-05-06 LAB — BASIC METABOLIC PANEL
Anion gap: 9 (ref 5–15)
BUN: 35 mg/dL — AB (ref 6–20)
CHLORIDE: 103 mmol/L (ref 101–111)
CO2: 28 mmol/L (ref 22–32)
CREATININE: 6.57 mg/dL — AB (ref 0.61–1.24)
Calcium: 8.2 mg/dL — ABNORMAL LOW (ref 8.9–10.3)
GFR calc Af Amer: 10 mL/min — ABNORMAL LOW (ref >60)
GFR calc non Af Amer: 9 mL/min — ABNORMAL LOW (ref >60)
Glucose, Bld: 89 mg/dL (ref 65–99)
POTASSIUM: 4.6 mmol/L (ref 3.5–5.1)
SODIUM: 140 mmol/L (ref 135–145)

## 2015-05-06 LAB — PREPARE RBC (CROSSMATCH)

## 2015-05-06 MED ORDER — DM-GUAIFENESIN ER 30-600 MG PO TB12: 1.0000 | ORAL_TABLET | Freq: Two times a day (BID) | ORAL | Status: DC

## 2015-05-06 MED ORDER — SENNOSIDES-DOCUSATE SODIUM 8.6-50 MG PO TABS
2.0000 | ORAL_TABLET | Freq: Two times a day (BID) | ORAL | Status: DC
  Administered 2015-05-06 – 2015-05-08 (×2): 2 via ORAL
  Filled 2015-05-06 (×4): qty 2

## 2015-05-06 MED ORDER — DEXTROMETHORPHAN POLISTIREX ER 30 MG/5ML PO SUER
30.0000 mg | Freq: Two times a day (BID) | ORAL | Status: DC
  Administered 2015-05-06 – 2015-05-08 (×4): 30 mg via ORAL
  Filled 2015-05-06 (×6): qty 5

## 2015-05-06 MED ORDER — PANTOPRAZOLE SODIUM 40 MG PO TBEC
40.0000 mg | DELAYED_RELEASE_TABLET | Freq: Two times a day (BID) | ORAL | Status: DC
  Administered 2015-05-06 – 2015-05-08 (×3): 40 mg via ORAL
  Filled 2015-05-06 (×3): qty 1

## 2015-05-06 MED ORDER — GUAIFENESIN ER 600 MG PO TB12
600.0000 mg | ORAL_TABLET | Freq: Two times a day (BID) | ORAL | Status: DC
  Administered 2015-05-06 – 2015-05-08 (×4): 600 mg via ORAL
  Filled 2015-05-06 (×4): qty 1

## 2015-05-06 MED ORDER — SODIUM CHLORIDE 0.9 % IV SOLN
Freq: Once | INTRAVENOUS | Status: AC
  Administered 2015-05-06: 15:00:00 via INTRAVENOUS

## 2015-05-06 NOTE — Progress Notes (Signed)
Pharmacy Antibiotic Note  Stanley [name] is a 55 y.o. male admitted on 04/30/2015 with Fever of unknown origin.  Pharmacy has been consulted for Vancomycin and Zosyn dosing.  Plan: Continue Vancomycin 1 gm IV with each HD session.  Patient received a dose with HD session on 2/21.    Continue Zoysn 3.375 g IV q12h per EI protocol  Height: 6\' 3"  (190.5 cm) Weight: 192 lb 0.3 oz (87.1 kg) IBW/kg (Calculated) : 84.5  Temp (24hrs), Avg:99.1 F (37.3 C), Min:97.7 F (36.5 C), Max:100.4 F (38 C)   Recent Labs Lab 04/30/15 1825 05/01/15 0341  05/02/15 1106 05/03/15 0535 05/03/15 1212 05/04/15 0611 05/05/15 1244 05/06/15 0515  WBC  --  6.6  < > 6.5 7.3  --  8.1 6.0 3.8  CREATININE 4.91* 5.47*  --  7.38*  --   --   --   --  6.57*  VANCOTROUGH  --   --   --   --   --  67*  --   --   --   < > = values in this interval not displayed.  Estimated Creatinine Clearance: 15.2 mL/min (by C-G formula based on Cr of 6.57).    No Known Allergies  Antimicrobials this admission: Anti-infectives    Start     Dose/Rate Route Frequency Ordered Stop   05/05/15 1900  oseltamivir (TAMIFLU) capsule 30 mg     30 mg Oral Once per day on Tue Thu Sat 05/04/15 1535 05/09/15 1859   05/05/15 1200  vancomycin (VANCOCIN) IVPB 1000 mg/200 mL premix     1,000 mg 200 mL/hr over 60 Minutes Intravenous Every T-Th-Sa (Hemodialysis) 05/03/15 0941     05/04/15 1545  oseltamivir (TAMIFLU) capsule 30 mg  Status:  Discontinued     30 mg Oral Daily 05/04/15 1530 05/04/15 1535   05/04/15 1545  oseltamivir (TAMIFLU) capsule 30 mg     30 mg Oral STAT 05/04/15 1535 05/04/15 1637   05/04/15 1445  oseltamivir (TAMIFLU) capsule 75 mg  Status:  Discontinued     75 mg Oral 2 times daily 05/04/15 1435 05/04/15 1530   05/03/15 1000  piperacillin-tazobactam (ZOSYN) IVPB 3.375 g     3.375 g 12.5 mL/hr over 240 Minutes Intravenous Every 12 hours 05/03/15 0936     05/03/15 0945  vancomycin (VANCOCIN) 2,000 mg in sodium  chloride 0.9 % 500 mL IVPB     2,000 mg 250 mL/hr over 120 Minutes Intravenous  Once 05/03/15 [id number] 05/03/15 1227       Dose adjustments this admission: Trough prior to HD on 2/25.  Microbiology results: Results for orders placed or performed during the hospital encounter of 04/30/15  MRSA PCR Screening     Status: None   Collection Time: 05/01/15  1:36 AM  Result Value Ref Range Status   MRSA by PCR NEGATIVE NEGATIVE Final    Comment:        The GeneXpert MRSA Assay (FDA approved for NASAL specimens only), is one component of a comprehensive MRSA colonization surveillance program. It is not intended to diagnose MRSA infection nor to guide or monitor treatment for MRSA infections.   Culture, blood (Routine X 2) w Reflex to ID Panel     Status: None (Preliminary result)   Collection Time: 05/03/15  8:29 AM  Result Value Ref Range Status   Specimen Description BLOOD LEFT ARM  Final   Special Requests BOTTLES DRAWN AEROBIC AND ANAEROBIC  1CC  Final   Culture  NO GROWTH 3 DAYS  Final   Report Status PENDING  Incomplete  Culture, blood (Routine X 2) w Reflex to ID Panel     Status: None (Preliminary result)   Collection Time: 05/03/15  8:44 AM  Result Value Ref Range Status   Specimen Description BLOOD LEFT HAND  Final   Special Requests   Final    BOTTLES DRAWN AEROBIC AND ANAEROBIC  AER 6CC ANA 3CC   Culture NO GROWTH 3 DAYS  Final   Report Status PENDING  Incomplete  MRSA PCR Screening     Status: None   Collection Time: 05/04/15 11:24 AM  Result Value Ref Range Status   MRSA by PCR NEGATIVE NEGATIVE Final    Comment:        The GeneXpert MRSA Assay (FDA approved for NASAL specimens only), is one component of a comprehensive MRSA colonization surveillance program. It is not intended to diagnose MRSA infection nor to guide or monitor treatment for MRSA infections.   Culture, blood (Routine X 2) w Reflex to ID Panel     Status: None (Preliminary result)    Collection Time: 05/05/15  8:53 PM  Result Value Ref Range Status   Specimen Description BLOOD LEFT ARM  Final   Special Requests BOTTLES DRAWN AEROBIC AND ANAEROBIC 8ML  Final   Culture NO GROWTH < 12 HOURS  Final   Report Status PENDING  Incomplete  Culture, blood (Routine X 2) w Reflex to ID Panel     Status: None (Preliminary result)   Collection Time: 05/05/15  9:05 PM  Result Value Ref Range Status   Specimen Description BLOOD LEFT HAND  Final   Special Requests BOTTLES DRAWN AEROBIC AND ANAEROBIC 6ML  Final   Culture NO GROWTH < 12 HOURS  Final   Report Status PENDING  Incomplete    Thank you for allowing pharmacy to be a part of this patient's care.  Lilas Diefendorf G 05/06/2015 12:24 PM

## 2015-05-06 NOTE — Progress Notes (Signed)
Dr. Rayann Heman called back and said that patient need to be NPO effective midnight for EGD purpose only.

## 2015-05-06 NOTE — Progress Notes (Signed)
[facility] NAME: Stanley [name]    MR#:  [id number]  DATE OF BIRTH:  07-23-1960  SUBJECTIVE:  Somewhat better, could not get blood transfusion yesterday as he had fever.  Hemoglobin 7.4 this morning, requesting medication for cough as he is not able to expectorate REVIEW OF SYSTEMS:  CONSTITUTIONAL: + fever, fatigue and weakness.  EYES: No blurred or double vision.  EARS, NOSE, AND THROAT: No tinnitus or ear pain.  RESPIRATORY: ++ cough, NO shortness of breath, wheezing or hemoptysis.  CARDIOVASCULAR: No chest pain, orthopnea, edema.  GASTROINTESTINAL: No nausea, vomiting, diarrhea or abdominal pain. Dark-colored stools  GENITOURINARY: No dysuria, hematuria.  ENDOCRINE: No polyuria, nocturia,  HEMATOLOGY: No anemia, easy bruising or bleeding SKIN: No rash or lesion. MUSCULOSKELETAL: No joint pain or arthritis.   NEUROLOGIC: No tingling, numbness, weakness.  PSYCHIATRY: No anxiety or depression.   DRUG ALLERGIES:  No Known Allergies  VITALS:  Blood pressure 116/65, pulse 71, temperature 98.2 F (36.8 C), temperature source Oral, resp. rate 20, height 6\' 3"  (1.905 m), weight 87.1 kg (192 lb 0.3 oz), SpO2 99 %.  PHYSICAL EXAMINATION:  VITAL SIGNS: Filed Vitals:   05/06/15 1508 05/06/15 1546  BP: 119/65 116/65  Pulse: 72 71  Temp: 98.5 F (36.9 C) 98.2 F (36.8 C)  Resp:  73   GENERAL:54 y.o.male currently in no acute distress.  HEAD: Normocephalic, atraumatic.  EYES: Pupils equal, round, reactive to light. Extraocular muscles intact. No scleral icterus.  MOUTH: Moist mucosal membrane. Dentition intact. No abscess noted.  EAR, NOSE, THROAT: Clear without exudates. No external lesions.  NECK: Supple. No thyromegaly. No nodules. No JVD.  PULMONARY: Clear to ascultation, without wheeze rails or rhonci. No use of accessory muscles, Good respiratory effort. good air entry bilaterally CHEST: Nontender to palpation.   CARDIOVASCULAR: S1 and S2. Regular rate and rhythm. No murmurs, rubs, or gallops. No edema. Pedal pulses 2+ bilaterally.  GASTROINTESTINAL: Soft, nontender, nondistended. No masses. Positive bowel sounds. No hepatosplenomegaly.  MUSCULOSKELETAL: No swelling, clubbing, or edema. Range of motion full in all extremities.  NEUROLOGIC: Cranial nerves II through XII are intact. No gross focal neurological deficits. Sensation intact. Reflexes intact.  SKIN: No ulceration, lesions, rashes, or cyanosis. Skin warm and dry. Turgor intact.  PSYCHIATRIC: Mood, affect within normal limits. The patient is awake, alert and oriented x 3. Insight, judgment intact.   LABORATORY PANEL:   CBC  Recent Labs Lab 05/06/15 0515  WBC 3.8  HGB 7.4*  HCT 23.2*  PLT 199   ------------------------------------------------------------------------------------------------------------------  Chemistries   Recent Labs Lab 04/30/15 1825  05/06/15 0515  NA 138  < > 140  K 4.1  < > 4.6  CL 100*  < > 103  CO2 33*  < > 28  GLUCOSE 109*  < > 89  BUN 38*  < > 35*  CREATININE 4.91*  < > 6.57*  CALCIUM 9.6  < > 8.2*  AST 26  --   --   ALT 19  --   --   ALKPHOS 128*  --   --   BILITOT 0.6  --   --   < > = values in this interval not displayed. ------------------------------------------------------------------------------------------------------------------  Cardiac Enzymes No results for input(s): TROPONINI in the last 168 hours. ------------------------------------------------------------------------------------------------------------------  RADIOLOGY:  US Abdomen Complete  05/05/2015  CLINICAL DATA:  Fevers EXAM: ABDOMEN ULTRASOUND COMPLETE COMPARISON:  None FINDINGS: Gallbladder: No gallstones or wall thickening visualized. No  sonographic Percell Miller sign noted by sonographer. Common bile duct: Diameter: 4.0 mm Liver: No focal lesion identified. Within normal limits in parenchymal echogenicity. IVC: No abnormality  visualized. Pancreas: Not well visualized due to overlying bowel gas. Spleen: Size and appearance within normal limits. Right Kidney: Length: 8.4 cm. Increased echogenicity is noted consistent with medical renal disease. Multiple cysts are seen. The largest of these lies in the lower pole measuring 1.3 cm. Left Kidney: Length: 9.6 cm. Increased echogenicity is noted similar to that noted on the right. Cystic lesions are noted. The largest of these measures 2 cm in the lower pole. Abdominal aorta: No aneurysm visualized. Other findings: Mild to moderate ascites is noted. IMPRESSION: Mild to moderate ascites. Changes consistent with medical renal disease No other focal abnormality is noted. Electronically Signed   By: Inez Catalina M.D.   On: 05/05/2015 09:33    EKG:     ASSESSMENT AND PLAN:   55 year old gentleman history end-stage renal disease on hemodialysis presenting with anemia secondary to GI bleed  1. Symptomatic anemia secondary to acute on chronic upper GI bleed: Status post 2 unit transfusion, CBC slowly trending down hemoglobin, hemoglobin is 7.4 (Patient had normal EGD in January with colon polyp on colonoscopy) Continue PPI. EGD canceled as patient was still running fever last 24 hours - if still here.  May get it on Friday - We will go and transfuse 1 more unit of packed red blood cells with hemoglobin of 7.4 - they are unable to give him blood yesterday as he had fever.    2. Fever: all w/up neg thus far. continue Empiric Zosyn and vancomycin. - on Tamiflu empirically - infectious disease Dr. Ola Spurr following - Pending HIV - Pending sputum cx - Abd USS showed mild to moderate ascites - we will request ultrasound-guided paracentesis  3 [name]: Nephrology consult appreciated. Continue dialysis as per nephrology. 4. Coronary artery disease status post bypass no angina: Aspirin on hold Due to problem #1.  Continue atorvastatin, Benzapril,  labetalol  5. Essential hypertension:Continuel  and Benzapril 6. Hyperlipidemia unspecified:  Continue atorvastatin     Venous thromboembolism prophylactic: SCDs     Management plans discussed with the patient  CODE STATUS: Full  TOTAL TIME TAKING CARE OF THIS PATIENT: 29 minutes.   Discussed with Dr. Ola Spurr  POSSIBLE D/C 1-2 days DEPENDING ON CLINICAL CONDITION.   [facility], Mavric Cortright M.D on 05/06/2015 at 4:53 PM  Between 7am to 6pm - Pager - [phone]  After 6pm: House Pager: [phone]  [facility]  Office  [phone]  CC: Primary care physician; Elyse Jarvis, MD

## 2015-05-06 NOTE — Progress Notes (Signed)
Patient is really upset on being NPO for he wanted some food. Paged  Dr. Rayann Heman x 1 and Dr. Barnabas Harries 2. No return call received at this time. Wanted to verify the diet. I also called Dr. Manuella Ghazi but he stated he did not put the NPO order.

## 2015-05-06 NOTE — Care Management (Signed)
EGD has been deferred due to temp.  To receive one unit of packed cells. To have paracentesis.

## 2015-05-06 NOTE — Care Management Important Message (Signed)
Important Message  Patient Details  Name: Stanley [name] MRN: [id number] Date of Birth: 1961/02/22   Medicare Important Message Given:  Yes    Katrina Stack, RN 05/06/2015, 2:06 PM

## 2015-05-07 ENCOUNTER — Encounter
Admission: EM | Disposition: A | Discharge status: Home / Self Care | Payer: Self-pay | Source: Home / Self Care | Attending: Internal Medicine

## 2015-05-07 ENCOUNTER — Inpatient Hospital Stay: Payer: Medicare Other | Admitting: *Deleted

## 2015-05-07 HISTORY — PX: ESOPHAGOGASTRODUODENOSCOPY: SHX5428

## 2015-05-07 LAB — TYPE AND SCREEN
ABO/RH(D): A POS
Antibody Screen: NEGATIVE
Unit division: 0

## 2015-05-07 LAB — POTASSIUM: Potassium: 4.2 mmol/L (ref 3.5–5.1)

## 2015-05-07 SURGERY — EGD (ESOPHAGOGASTRODUODENOSCOPY)
Anesthesia: General

## 2015-05-07 MED ORDER — PROPOFOL 10 MG/ML IV BOLUS
INTRAVENOUS | Status: DC | PRN
  Administered 2015-05-07 (×2): 30 mg via INTRAVENOUS

## 2015-05-07 MED ORDER — SODIUM CHLORIDE 0.9 % IV SOLN
INTRAVENOUS | Status: DC
  Administered 2015-05-07: 16:00:00 via INTRAVENOUS

## 2015-05-07 MED ORDER — PROPOFOL 500 MG/50ML IV EMUL
INTRAVENOUS | Status: DC | PRN
  Administered 2015-05-07: 120 ug/kg/min via INTRAVENOUS

## 2015-05-07 MED ORDER — SODIUM CHLORIDE 0.9 % IV SOLN: INTRAVENOUS | Status: DC

## 2015-05-07 NOTE — Clinical Social Work Note (Addendum)
Patient is active at [facility] on a TTS schedule.  Clinic is aware of hospital admission and I will update them with additional records at discharge.  [facility] Coordinator  [phone]

## 2015-05-07 NOTE — Progress Notes (Signed)
Post-HD tx  

## 2015-05-07 NOTE — Progress Notes (Signed)
Subjective:  Patient is doing poorly Patient seen during dialysis Tolerating well    HEMODIALYSIS FLOWSHEET:  Blood Flow Rate (mL/min): 400 mL/min Arterial Pressure (mmHg): -160 mmHg Venous Pressure (mmHg): 170 mmHg Transmembrane Pressure (mmHg): 50 mmHg Ultrafiltration Rate (mL/min): 570 mL/min Dialysate Flow Rate (mL/min): 800 ml/min Conductivity: Machine : 14 Conductivity: Machine : 14 Dialysis Fluid Bolus: Normal Saline Bolus Amount (mL): 250 mL Intra-Hemodialysis Comments: 2555ml.Marland KitchenMarland KitchenTx completed.     Objective:  Vital signs in last 24 hours:  Temp:  [98 F (36.7 C)-98.5 F (36.9 C)] 98.1 F (36.7 C) (02/23 1326) Pulse Rate:  [68-80] 77 (02/23 1326) Resp:  [12-20] 18 (02/23 1326) BP: (116-144)/(65-100) 128/99 mmHg (02/23 1326) SpO2:  [97 %-100 %] 100 % (02/23 0558) Weight:  [85 kg (187 lb 6.3 oz)-87.1 kg (192 lb 0.3 oz)] 85 kg (187 lb 6.3 oz) (02/23 1326)  Weight change:  Filed Weights   05/02/15 1100 05/07/15 0945 05/07/15 1326  Weight: 87.1 kg (192 lb 0.3 oz) 87.1 kg (192 lb 0.3 oz) 85 kg (187 lb 6.3 oz)    Intake/Output:    Intake/Output Summary (Last 24 hours) at 05/07/15 1409 Last data filed at 05/07/15 1326  Gross per 24 hour  Intake    400 ml  Output   2875 ml  Net  -2475 ml     Physical Exam: General: Laying in the bed  HEENT Moist mucous membranes  Neck supple  Pulm/lungs Normal respiratory effort, clear  CVS/Heart No rub or gallop  Abdomen:  Mildly distended, nontender, soft  Extremities: trace edema  Neurologic: alert  Skin: No acute rashes  Access: AV fistula       Basic Metabolic Panel:   Recent Labs Lab 04/30/15 1825 05/01/15 0341 05/02/15 1106 05/06/15 0515  NA 138 138 134* 140  K 4.1 4.4 4.9 4.6  CL 100* 100* 99* 103  CO2 33* 29 24 28   GLUCOSE 109* 75 108* 89  BUN 38* 43* 56* 35*  CREATININE 4.91* 5.47* 7.38* 6.57*  CALCIUM 9.6 9.9 10.1 8.2*  PHOS  --   --  6.9*  --      CBC:  Recent Labs Lab 04/30/15 1712   05/03/15 0535 05/04/15 0611 05/05/15 1244 05/06/15 0515 05/06/15 2018  WBC 6.2  < > 7.3 8.1 6.0 3.8 5.7  NEUTROABS 4.4  --   --   --   --   --   --   HGB 5.7*  < > 7.8* 7.7* 7.1* 7.4* 8.0*  HCT 18.6*  < > 24.4* 24.1* 22.8* 23.2* 25.4*  MCV 96.6  < > 95.3 92.0 92.3 92.4 91.3  PLT 319  < > 275 238 211 199 204  < > = values in this interval not displayed.    Microbiology:  Recent Results (from the past 720 hour(s))  MRSA PCR Screening     Status: None   Collection Time: 05/01/15  1:36 AM  Result Value Ref Range Status   MRSA by PCR NEGATIVE NEGATIVE Final    Comment:        The GeneXpert MRSA Assay (FDA approved for NASAL specimens only), is one component of a comprehensive MRSA colonization surveillance program. It is not intended to diagnose MRSA infection nor to guide or monitor treatment for MRSA infections.   Culture, blood (Routine X 2) w Reflex to ID Panel     Status: None (Preliminary result)   Collection Time: 05/03/15  8:29 AM  Result Value Ref Range Status  Specimen Description BLOOD LEFT ARM  Final   Special Requests BOTTLES DRAWN AEROBIC AND ANAEROBIC  1CC  Final   Culture NO GROWTH 4 DAYS  Final   Report Status PENDING  Incomplete  Culture, blood (Routine X 2) w Reflex to ID Panel     Status: None (Preliminary result)   Collection Time: 05/03/15  8:44 AM  Result Value Ref Range Status   Specimen Description BLOOD LEFT HAND  Final   Special Requests   Final    BOTTLES DRAWN AEROBIC AND ANAEROBIC  AER 6CC ANA 3CC   Culture NO GROWTH 4 DAYS  Final   Report Status PENDING  Incomplete  MRSA PCR Screening     Status: None   Collection Time: 05/04/15 11:24 AM  Result Value Ref Range Status   MRSA by PCR NEGATIVE NEGATIVE Final    Comment:        The GeneXpert MRSA Assay (FDA approved for NASAL specimens only), is one component of a comprehensive MRSA colonization surveillance program. It is not intended to diagnose MRSA infection nor to guide  or monitor treatment for MRSA infections.   Culture, blood (Routine X 2) w Reflex to ID Panel     Status: None (Preliminary result)   Collection Time: 05/05/15  8:53 PM  Result Value Ref Range Status   Specimen Description BLOOD LEFT ARM  Final   Special Requests BOTTLES DRAWN AEROBIC AND ANAEROBIC 8ML  Final   Culture NO GROWTH 2 DAYS  Final   Report Status PENDING  Incomplete  Culture, blood (Routine X 2) w Reflex to ID Panel     Status: None (Preliminary result)   Collection Time: 05/05/15  9:05 PM  Result Value Ref Range Status   Specimen Description BLOOD LEFT HAND  Final   Special Requests BOTTLES DRAWN AEROBIC AND ANAEROBIC 6ML  Final   Culture NO GROWTH 2 DAYS  Final   Report Status PENDING  Incomplete    Coagulation Studies: No results for input(s): LABPROT, INR in the last 72 hours.  Urinalysis: No results for input(s): COLORURINE, LABSPEC, PHURINE, GLUCOSEU, HGBUR, BILIRUBINUR, KETONESUR, PROTEINUR, UROBILINOGEN, NITRITE, LEUKOCYTESUR in the last 72 hours.  Invalid input(s): APPERANCEUR    Imaging: No results found.   Medications:     . atorvastatin  40 mg Oral Daily  . benazepril  10 mg Oral Daily  . calcium acetate  2,001 mg Oral TID WC  . cinacalcet  60 mg Oral Q supper  . guaiFENesin  600 mg Oral BID   And  . dextromethorphan  30 mg Oral BID  . furosemide  40 mg Oral Daily  . gabapentin  300 mg Oral Daily  . labetalol  200 mg Oral Daily  . multivitamin with minerals  1 tablet Oral Daily  . oseltamivir  30 mg Oral Once per day on Tue Thu Sat  . pantoprazole  40 mg Oral BID AC  . piperacillin-tazobactam (ZOSYN)  IV  3.375 g Intravenous Q12H  . senna-docusate  2 tablet Oral BID  . vancomycin  1,000 mg Intravenous Q T,Th,Sa-HD   acetaminophen, ipratropium-albuterol, oxyCODONE-acetaminophen  Assessment/ Plan:  55 y.o.african American male  with end-stage renal disease, dialysis at [facility], coronary disease, hypertension, history of alcohol  abuse, history of drug abuse  [facility] [address] TTS  1. End-stage renal disease:  - continue TTS schedule.  - Patient seen during dialysis  2. Anemia of chronic kidney disease and recent GI bleed.  -  Status post 2 units PRBC on 2/16. Colonoscopy done on 1/29.  -  epo with HD treatment. -  EGD planned  3. Secondary hyperparathyroidism: - monitor ca/phos during hospitalization  4. Ascites from liver cirrhosis: status post paracentesis on 04/06/15.   5. Fever - low grade last night   LOS: 7 Nekeisha Aure 2/23/20172:09 PM

## 2015-05-07 NOTE — Op Note (Signed)
No active bleeding. One medium sized gastric AVM seen. This was cauterized. Paracentesis rescheduled for tomorrow. Will sign off. Thanks.

## 2015-05-07 NOTE — Op Note (Signed)
[facility] Gastroenterology Patient Name: Stanley [name] Procedure Date: 05/07/2015 3:45 PM MRN: [id number] Account #: [id number] Date of Birth: February 14, 1961 Admit Type: Inpatient Age: 55 Room: [facility] ENDO ROOM 4 Gender: Male Note Status: Finalized Procedure:            Upper GI endoscopy Indications:          Iron deficiency anemia secondary to chronic blood loss,                        Melena Providers:            Lupita Dawn. Candace Cruise, MD Referring MD:         Dyke Maes. Mancheno Revelo (Referring MD) Medicines:            Monitored Anesthesia Care Complications:        No immediate complications. Procedure:            Pre-Anesthesia Assessment:                       - Prior to the procedure, a History and Physical was                        performed, and patient medications, allergies and                        sensitivities were reviewed. The patient's tolerance of                        previous anesthesia was reviewed.                       - The risks and benefits of the procedure and the                        sedation options and risks were discussed with the                        patient. All questions were answered and informed                        consent was obtained.                       - After reviewing the risks and benefits, the patient                        was deemed in satisfactory condition to undergo the                        procedure.                       After obtaining informed consent, the endoscope was                        passed under direct vision. Throughout the procedure,                        the patient's blood pressure, pulse, and oxygen  saturations were monitored continuously. The Endoscope                        was introduced through the mouth, and advanced to the                        second part of duodenum. The upper GI endoscopy was                        accomplished without difficulty. The patient  tolerated                        the procedure well. Findings:      The examined esophagus was normal.      A single medium no bleeding angiodysplastic lesion was found in the       gastric fundus. Coagulation for hemostasis using heater probe was       successful.      The exam was otherwise without abnormality.      The examined duodenum was normal. Impression:           - Normal esophagus.                       - A single non-bleeding angiodysplastic lesion in the                        stomach. Treated with a heater probe.                       - The examination was otherwise normal.                       - Normal examined duodenum.                       - No specimens collected. Recommendation:       - Discharge patient to home.                       - Observe patient's clinical course. Procedure Code(s):    --- Professional ---                       [phone], Esophagogastroduodenoscopy, flexible, transoral;                        with control of bleeding, any method Diagnosis Code(s):    --- Professional ---                       [id number], Angiodysplasia of stomach and duodenum without                        bleeding                       D50.0, Iron deficiency anemia secondary to blood loss                        (chronic)                       K92.1, Melena (includes Hematochezia) CPT copyright 2016 American Medical Association. All rights  reserved. The codes documented in this report are preliminary and upon coder review may  be revised to meet current compliance requirements. Hulen Luster, MD 05/07/2015 4:02:43 PM This report has been signed electronically. Number of Addenda: 0 Note Initiated On: 05/07/2015 3:45 PM      [facility]

## 2015-05-07 NOTE — Progress Notes (Signed)
[facility] [place] NAME: Stanley [name]    MR#:  [id number]  DATE OF BIRTH:  07/13/1960  SUBJECTIVE:  Seen at HD, no complaints, hungry  REVIEW OF SYSTEMS:  CONSTITUTIONAL: + fever, fatigue and weakness.  EYES: No blurred or double vision.  EARS, NOSE, AND THROAT: No tinnitus or ear pain.  RESPIRATORY: ++ cough, NO shortness of breath, wheezing or hemoptysis.  CARDIOVASCULAR: No chest pain, orthopnea, edema.  GASTROINTESTINAL: No nausea, vomiting, diarrhea or abdominal pain. Dark-colored stools  GENITOURINARY: No dysuria, hematuria.  ENDOCRINE: No polyuria, nocturia,  HEMATOLOGY: No anemia, easy bruising or bleeding SKIN: No rash or lesion. MUSCULOSKELETAL: No joint pain or arthritis.   NEUROLOGIC: No tingling, numbness, weakness.  PSYCHIATRY: No anxiety or depression.   DRUG ALLERGIES:  No Known Allergies  VITALS:  Blood pressure 127/67, pulse 79, temperature 97.4 F (36.3 C), temperature source Oral, resp. rate 18, height 6\' 3"  (1.905 m), weight 85 kg (187 lb 6.3 oz), SpO2 98 %.  PHYSICAL EXAMINATION:  VITAL SIGNS: Filed Vitals:   05/07/15 1326 05/07/15 1415  BP: 128/99 127/67  Pulse: 77 79  Temp: 98.1 F (36.7 C) 97.4 F (36.3 C)  Resp: 18 18   GENERAL:54 y.o.male currently in no acute distress.  HEAD: Normocephalic, atraumatic.  EYES: Pupils equal, round, reactive to light. Extraocular muscles intact. No scleral icterus.  MOUTH: Moist mucosal membrane. Dentition intact. No abscess noted.  EAR, NOSE, THROAT: Clear without exudates. No external lesions.  NECK: Supple. No thyromegaly. No nodules. No JVD.  PULMONARY: Clear to ascultation, without wheeze rails or rhonci. No use of accessory muscles, Good respiratory effort. good air entry bilaterally CHEST: Nontender to palpation.  CARDIOVASCULAR: S1 and S2. Regular rate and rhythm. No murmurs, rubs, or gallops. No edema. Pedal pulses 2+ bilaterally.   GASTROINTESTINAL: Soft, nontender, nondistended. No masses. Positive bowel sounds. No hepatosplenomegaly.  MUSCULOSKELETAL: No swelling, clubbing, or edema. Range of motion full in all extremities.  NEUROLOGIC: Cranial nerves II through XII are intact. No gross focal neurological deficits. Sensation intact. Reflexes intact.  SKIN: No ulceration, lesions, rashes, or cyanosis. Skin warm and dry. Turgor intact.  PSYCHIATRIC: Mood, affect within normal limits. The patient is awake, alert and oriented x 3. Insight, judgment intact.   LABORATORY PANEL:   CBC  Recent Labs Lab 05/06/15 2018  WBC 5.7  HGB 8.0*  HCT 25.4*  PLT 204   ------------------------------------------------------------------------------------------------------------------  Chemistries   Recent Labs Lab 04/30/15 1825  05/06/15 0515  NA 138  < > 140  K 4.1  < > 4.6  CL 100*  < > 103  CO2 33*  < > 28  GLUCOSE 109*  < > 89  BUN 38*  < > 35*  CREATININE 4.91*  < > 6.57*  CALCIUM 9.6  < > 8.2*  AST 26  --   --   ALT 19  --   --   ALKPHOS 128*  --   --   BILITOT 0.6  --   --   < > = values in this interval not displayed. ------------------------------------------------------------------------------------------------------------------  Cardiac Enzymes No results for input(s): TROPONINI in the last 168 hours. ------------------------------------------------------------------------------------------------------------------  RADIOLOGY:  No results found.  EKG:     ASSESSMENT AND PLAN:   55 year old gentleman history end-stage renal disease on hemodialysis presenting with anemia secondary to GI bleed  1. Symptomatic anemia secondary to acute on chronic upper GI bleed: Status post 3 unit transfusion, CBC  slowly trending down hemoglobin, hemoglobin is 8 (Patient had normal EGD in January with colon polyp on colonoscopy) Continue PPI. EGD planned for today after HD -    2. Fever: all w/up neg thus far.  continue Empiric Zosyn and vancomycin. - on Tamiflu empirically - infectious disease Dr. Ola Spurr following - Pending HIV - Pending sputum cx - Abd USS showed mild to moderate ascites - ultrasound-guided paracentesis today  3 [name]: Nephrology consult appreciated. Continue dialysis as per nephrology. 4. Coronary artery disease status post bypass no angina: Aspirin on hold Due to problem #1.  Continue atorvastatin, Benzapril, labetalol  5. Essential hypertension:Continuel  and Benzapril 6. Hyperlipidemia unspecified:  Continue atorvastatin     Venous thromboembolism prophylactic: SCDs     Management plans discussed with the patient  CODE STATUS: Full  TOTAL TIME TAKING CARE OF THIS PATIENT: 29 minutes.    POSSIBLE D/C AM DEPENDING ON CLINICAL CONDITION.   [facility], Missi Mcmackin M.D on 05/07/2015 at 2:45 PM  Between 7am to 6pm - Pager - [phone]  After 6pm: House Pager: - [phone]  [facility]  Office  [phone]  CC: Primary care physician; Elyse Jarvis, MD

## 2015-05-07 NOTE — Transfer of Care (Signed)
Immediate Anesthesia Transfer of Care Note  Patient: Stanley [name]  Procedure(s) Performed: Procedure(s): ESOPHAGOGASTRODUODENOSCOPY (EGD) (N/A)  Patient Location: PACU  Anesthesia Type:General  Level of Consciousness: awake, alert  and oriented  Airway & Oxygen Therapy: Patient Spontanous Breathing  Post-op Assessment: Report given to RN and Post -op Vital signs reviewed and stable  Post vital signs: Reviewed and stable  Last Vitals:  Filed Vitals:   05/07/15 1326 05/07/15 1415  BP: 128/99 127/67  Pulse: 77 79  Temp: 36.7 C 36.3 C  Resp: 18 18    Complications: No apparent anesthesia complications

## 2015-05-07 NOTE — Anesthesia Preprocedure Evaluation (Signed)
Anesthesia Evaluation  Patient identified by MRN, date of birth, ID band Patient awake    Reviewed: Allergy & Precautions, NPO status   Airway Mallampati: II       Dental  (+) Partial Upper, Edentulous Lower   Pulmonary shortness of breath and with exertion, Current Smoker,    + rhonchi  + decreased breath sounds      Cardiovascular Exercise Tolerance: Poor hypertension, Pt. on medications + CAD and + CABG   Rhythm:Regular     Neuro/Psych    GI/Hepatic Neg liver ROS, GERD  ,  Endo/Other  diabetes  Renal/GU DialysisRenal disease     Musculoskeletal   Abdominal   Peds  Hematology  (+) anemia ,   Anesthesia Other Findings   Reproductive/Obstetrics                             Anesthesia Physical Anesthesia Plan  ASA: IV  Anesthesia Plan: General   Post-op Pain Management:    Induction: Intravenous  Airway Management Planned: Natural Airway and Nasal Cannula  Additional Equipment:   Intra-op Plan:   Post-operative Plan:   Informed Consent: I have reviewed the patients History and Physical, chart, labs and discussed the procedure including the risks, benefits and alternatives for the proposed anesthesia with the patient or authorized representative who has indicated his/her understanding and acceptance.     Plan Discussed with: CRNA  Anesthesia Plan Comments:         Anesthesia Quick Evaluation

## 2015-05-08 ENCOUNTER — Other Ambulatory Visit: Payer: Self-pay

## 2015-05-08 ENCOUNTER — Inpatient Hospital Stay: Payer: Medicare Other

## 2015-05-08 LAB — GLUCOSE, SEROUS FLUID: GLUCOSE FL: 110 mg/dL

## 2015-05-08 LAB — CULTURE, BLOOD (ROUTINE X 2)
CULTURE: NO GROWTH
Culture: NO GROWTH

## 2015-05-08 LAB — ALBUMIN, FLUID (OTHER): ALBUMIN FL: 1.7 g/dL

## 2015-05-08 LAB — LACTATE DEHYDROGENASE, PLEURAL OR PERITONEAL FLUID: LD FL: 128 U/L — AB (ref 3–23)

## 2015-05-08 LAB — BODY FLUID CELL COUNT WITH DIFFERENTIAL
EOS FL: 0 %
Lymphs, Fluid: 23 %
MONOCYTE-MACROPHAGE-SEROUS FLUID: 17 %
Neutrophil Count, Fluid: 60 %
Other Cells, Fluid: 0 %
Total Nucleated Cell Count, Fluid: 503 cu mm

## 2015-05-08 LAB — BILIRUBIN, TOTAL: Total Bilirubin: 1 mg/dL (ref 0.3–1.2)

## 2015-05-08 LAB — AMYLASE: AMYLASE: 63 U/L (ref 28–100)

## 2015-05-08 LAB — PROTEIN, BODY FLUID: TOTAL PROTEIN, FLUID: 3.7 g/dL

## 2015-05-08 MED ORDER — OXYCODONE-ACETAMINOPHEN 5-325 MG PO TABS
1.0000 | ORAL_TABLET | Freq: Once | ORAL | Status: AC
  Administered 2015-05-08: 1 via ORAL

## 2015-05-08 NOTE — Progress Notes (Signed)
Pt to be discharged per MD order. Instructions reviewed with pt and all questions were answered. IV removed.

## 2015-05-08 NOTE — Procedures (Signed)
Interventional Radiology Procedure Note  Procedure: US guided para.  Complications: None Recommendations:  - follow up labs   - Routine care   Signed,  Dulcy Fanny. Earleen Newport, DO

## 2015-05-08 NOTE — Progress Notes (Signed)
Pharmacy Antibiotic Note  Stanley [name] is a 55 y.o. male admitted on 04/30/2015 with Fever of unknown origin.  Pharmacy has been consulted for Vancomycin and Zosyn dosing.  Plan: Continue Vancomycin 1 gm IV with each HD session.  Patient received a dose with HD session on 2/23.   Continue Zoysn 3.375 g IV q12h per EI protocol  Height: 6\' 3"  (190.5 cm) Weight: 187 lb 6.3 oz (85 kg) IBW/kg (Calculated) : 84.5  Temp (24hrs), Avg:97.8 F (36.6 C), Min:97.1 F (36.2 C), Max:98.3 F (36.8 C)   Recent Labs Lab 05/02/15 1106 05/03/15 0535 05/03/15 1212 05/04/15 0611 05/05/15 1244 05/06/15 0515 05/06/15 2018  WBC 6.5 7.3  --  8.1 6.0 3.8 5.7  CREATININE 7.38*  --   --   --   --  6.57*  --   VANCOTROUGH  --   --  67*  --   --   --   --     Estimated Creatinine Clearance: 15 mL/min (by C-G formula based on Cr of 6.57).    No Known Allergies  Antimicrobials this admission: Anti-infectives    Start     Dose/Rate Route Frequency Ordered Stop   05/05/15 1900  oseltamivir (TAMIFLU) capsule 30 mg     30 mg Oral Once per day on Tue Thu Sat 05/04/15 1535 05/07/15 1749   05/05/15 1200  vancomycin (VANCOCIN) IVPB 1000 mg/200 mL premix     1,000 mg 200 mL/hr over 60 Minutes Intravenous Every T-Th-Sa (Hemodialysis) 05/03/15 0941     05/04/15 1545  oseltamivir (TAMIFLU) capsule 30 mg  Status:  Discontinued     30 mg Oral Daily 05/04/15 1530 05/04/15 1535   05/04/15 1545  oseltamivir (TAMIFLU) capsule 30 mg     30 mg Oral STAT 05/04/15 1535 05/04/15 1637   05/04/15 1445  oseltamivir (TAMIFLU) capsule 75 mg  Status:  Discontinued     75 mg Oral 2 times daily 05/04/15 1435 05/04/15 1530   05/03/15 1000  piperacillin-tazobactam (ZOSYN) IVPB 3.375 g     3.375 g 12.5 mL/hr over 240 Minutes Intravenous Every 12 hours 05/03/15 0936     05/03/15 0945  vancomycin (VANCOCIN) 2,000 mg in sodium chloride 0.9 % 500 mL IVPB     2,000 mg 250 mL/hr over 120 Minutes Intravenous  Once 05/03/15 [id number]  05/03/15 1227       Dose adjustments this admission: Trough prior to HD on 2/25.  Microbiology results: Results for orders placed or performed during the hospital encounter of 04/30/15  MRSA PCR Screening     Status: None   Collection Time: 05/01/15  1:36 AM  Result Value Ref Range Status   MRSA by PCR NEGATIVE NEGATIVE Final    Comment:        The GeneXpert MRSA Assay (FDA approved for NASAL specimens only), is one component of a comprehensive MRSA colonization surveillance program. It is not intended to diagnose MRSA infection nor to guide or monitor treatment for MRSA infections.   Culture, blood (Routine X 2) w Reflex to ID Panel     Status: None   Collection Time: 05/03/15  8:29 AM  Result Value Ref Range Status   Specimen Description BLOOD LEFT ARM  Final   Special Requests BOTTLES DRAWN AEROBIC AND ANAEROBIC  1CC  Final   Culture NO GROWTH 5 DAYS  Final   Report Status 05/08/2015 FINAL  Final  Culture, blood (Routine X 2) w Reflex to ID Panel  Status: None   Collection Time: 05/03/15  8:44 AM  Result Value Ref Range Status   Specimen Description BLOOD LEFT HAND  Final   Special Requests   Final    BOTTLES DRAWN AEROBIC AND ANAEROBIC  AER 6CC ANA 3CC   Culture NO GROWTH 5 DAYS  Final   Report Status 05/08/2015 FINAL  Final  MRSA PCR Screening     Status: None   Collection Time: 05/04/15 11:24 AM  Result Value Ref Range Status   MRSA by PCR NEGATIVE NEGATIVE Final    Comment:        The GeneXpert MRSA Assay (FDA approved for NASAL specimens only), is one component of a comprehensive MRSA colonization surveillance program. It is not intended to diagnose MRSA infection nor to guide or monitor treatment for MRSA infections.   Culture, blood (Routine X 2) w Reflex to ID Panel     Status: None (Preliminary result)   Collection Time: 05/05/15  8:53 PM  Result Value Ref Range Status   Specimen Description BLOOD LEFT ARM  Final   Special Requests BOTTLES  DRAWN AEROBIC AND ANAEROBIC 8ML  Final   Culture NO GROWTH 3 DAYS  Final   Report Status PENDING  Incomplete  Culture, blood (Routine X 2) w Reflex to ID Panel     Status: None (Preliminary result)   Collection Time: 05/05/15  9:05 PM  Result Value Ref Range Status   Specimen Description BLOOD LEFT HAND  Final   Special Requests BOTTLES DRAWN AEROBIC AND ANAEROBIC 6ML  Final   Culture NO GROWTH 3 DAYS  Final   Report Status PENDING  Incomplete    Thank you for allowing pharmacy to be a part of this patient's care.  Dyllin Gulley G 05/08/2015 1:08 PM

## 2015-05-08 NOTE — Progress Notes (Signed)
[place] INFECTIOUS DISEASE PROGRESS NOTE Date of Admission:  04/30/2015     ID: Stanley [name] is a 55 y.o. male with fevers  Principal Problem:   Symptomatic anemia Active Problems:   GIB (gastrointestinal bleeding)   Subjective: No further fever, had para today.  ROS  Eleven systems are reviewed and negative except per hpi  Medications:  Antibiotics Given (last 72 hours)    Date/Time Action Medication Dose Rate   05/05/15 1658 Given  [pt in dialysis at time due]   vancomycin (VANCOCIN) IVPB 1000 mg/200 mL premix 1,000 mg 200 mL/hr   05/05/15 1855 Given   oseltamivir (TAMIFLU) capsule 30 mg 30 mg    05/05/15 2202 Given   piperacillin-tazobactam (ZOSYN) IVPB 3.375 g 3.375 g 12.5 mL/hr   05/06/15 0846 Given   piperacillin-tazobactam (ZOSYN) IVPB 3.375 g 3.375 g 12.5 mL/hr   05/06/15 2334 Given   piperacillin-tazobactam (ZOSYN) IVPB 3.375 g 3.375 g 12.5 mL/hr   05/07/15 1216 Given   vancomycin (VANCOCIN) IVPB 1000 mg/200 mL premix 1,000 mg 200 mL/hr   05/07/15 1749 Given   oseltamivir (TAMIFLU) capsule 30 mg 30 mg    05/08/15 1028 Given   piperacillin-tazobactam (ZOSYN) IVPB 3.375 g 3.375 g 12.5 mL/hr     . atorvastatin  40 mg Oral Daily  . benazepril  10 mg Oral Daily  . calcium acetate  2,001 mg Oral TID WC  . cinacalcet  60 mg Oral Q supper  . guaiFENesin  600 mg Oral BID   And  . dextromethorphan  30 mg Oral BID  . furosemide  40 mg Oral Daily  . gabapentin  300 mg Oral Daily  . labetalol  200 mg Oral Daily  . multivitamin with minerals  1 tablet Oral Daily  . pantoprazole  40 mg Oral BID AC  . piperacillin-tazobactam (ZOSYN)  IV  3.375 g Intravenous Q12H  . senna-docusate  2 tablet Oral BID  . vancomycin  1,000 mg Intravenous Q T,Th,Sa-HD    Objective: Vital signs in last 24 hours: Temp:  [97.1 F (36.2 C)-98.3 F (36.8 C)] 98.1 F (36.7 C) (02/24 0701) Pulse Rate:  [72-81] 72 (02/24 1142) Resp:  [11-20] 20 (02/24 1142) BP: (112-154)/(70-94)  133/84 mmHg (02/24 1142) SpO2:  [96 %-100 %] 98 % (02/24 1142) Constitutional: He is oriented to person, place, and time. He appears well-developed and well-nourished. No distress.  HENT: muddy sclera Mouth/Throat: Oropharynx is clear and moist. No oropharyngeal exudate.  Cardiovascular: Normal rate, regular rhythm and normal heart sounds. 2/6 sm Pulmonary/Chest:bil exp wheeze, and rhonchi Abdominal: Soft. Bowel sounds are normal. He exhibits mild distension. There is mild tenderness.  Lymphadenopathy: He has some shoddy cervical adenopathy.  Neurological: He is alert and oriented to person, place, and time.  Skin: Skin is warm and dry. No rash noted. No erythema.  Psychiatric: He has a normal mood and affect. His behavior is normal.  RUE AVG site wnl  Lab Results  Recent Labs  05/06/15 0515 05/06/15 2018 05/07/15 1448  WBC 3.8 5.7  --   HGB 7.4* 8.0*  --   HCT 23.2* 25.4*  --   NA 140  --   --   K 4.6  --  4.2  CL 103  --   --   CO2 28  --   --   BUN 35*  --   --   CREATININE 6.57*  --   --     Microbiology: Results for orders placed  or performed during the hospital encounter of 04/30/15  MRSA PCR Screening     Status: None   Collection Time: 05/01/15  1:36 AM  Result Value Ref Range Status   MRSA by PCR NEGATIVE NEGATIVE Final    Comment:        The GeneXpert MRSA Assay (FDA approved for NASAL specimens only), is one component of a comprehensive MRSA colonization surveillance program. It is not intended to diagnose MRSA infection nor to guide or monitor treatment for MRSA infections.   Culture, blood (Routine X 2) w Reflex to ID Panel     Status: None   Collection Time: 05/03/15  8:29 AM  Result Value Ref Range Status   Specimen Description BLOOD LEFT ARM  Final   Special Requests BOTTLES DRAWN AEROBIC AND ANAEROBIC  1CC  Final   Culture NO GROWTH 5 DAYS  Final   Report Status 05/08/2015 FINAL  Final  Culture, blood (Routine X 2) w Reflex to ID Panel      Status: None   Collection Time: 05/03/15  8:44 AM  Result Value Ref Range Status   Specimen Description BLOOD LEFT HAND  Final   Special Requests   Final    BOTTLES DRAWN AEROBIC AND ANAEROBIC  AER 6CC ANA 3CC   Culture NO GROWTH 5 DAYS  Final   Report Status 05/08/2015 FINAL  Final  MRSA PCR Screening     Status: None   Collection Time: 05/04/15 11:24 AM  Result Value Ref Range Status   MRSA by PCR NEGATIVE NEGATIVE Final    Comment:        The GeneXpert MRSA Assay (FDA approved for NASAL specimens only), is one component of a comprehensive MRSA colonization surveillance program. It is not intended to diagnose MRSA infection nor to guide or monitor treatment for MRSA infections.   Culture, blood (Routine X 2) w Reflex to ID Panel     Status: None (Preliminary result)   Collection Time: 05/05/15  8:53 PM  Result Value Ref Range Status   Specimen Description BLOOD LEFT ARM  Final   Special Requests BOTTLES DRAWN AEROBIC AND ANAEROBIC 8ML  Final   Culture NO GROWTH 3 DAYS  Final   Report Status PENDING  Incomplete  Culture, blood (Routine X 2) w Reflex to ID Panel     Status: None (Preliminary result)   Collection Time: 05/05/15  9:05 PM  Result Value Ref Range Status   Specimen Description BLOOD LEFT HAND  Final   Special Requests BOTTLES DRAWN AEROBIC AND ANAEROBIC 6ML  Final   Culture NO GROWTH 3 DAYS  Final   Report Status PENDING  Incomplete     Studies/Results: US Paracentesis  05/08/2015  INDICATION: 55 year old male with a history of cirrhosis. He presents for repeat paracentesis given recurrent ascites EXAM: ULTRASOUND GUIDED  PARACENTESIS MEDICATIONS: None. COMPLICATIONS: None PROCEDURE: Informed written consent was obtained from the patient after a discussion of the risks, benefits and alternatives to treatment. A timeout was performed prior to the initiation of the procedure. Initial ultrasound scanning demonstrates a large amount of ascites within the right lower  abdominal quadrant. The right lower abdomen was prepped and draped in the usual sterile fashion. 1% lidocaine with epinephrine was used for local anesthesia. Following this, a Safe-T-Centesis catheter catheter was introduced. An ultrasound image was saved for documentation purposes. The paracentesis was performed. The catheter was removed and a dressing was applied. The patient tolerated the procedure well without immediate post procedural  complication. FINDINGS: A total of approximately 4.3 L of serosanguineous fluid was removed. Samples were sent to the laboratory as requested by the clinical team. IMPRESSION: Status post ultrasound-guided paracentesis. 4.3 L of serosanguineous fluid removed. Signed, Dulcy Fanny. Earleen Newport, DO [facility] Electronically Signed   By: Corrie Mckusick D.O.   On: 05/08/2015 13:02    Assessment/Plan: Yann Lorentzen is a 55 y.o. male with a known history of coronary artery disease status post CABG, hypertension, gastroesophageal reflux disease, alcohol abuse and liver cirrhosis, end-stage renal disease on hemodialysis-admitted with low hgb from HD. WBC on admit was 6.4. He has since developed fevers to 104, starting 2/18, started vanco and zosyn 2/19 after bcx done. Flu negative, CXR negative. WBC only 8. L:FTs nml on admit. UA negative. Did receive blood 2/16 and 2/17.  His sxs of body aches, cough and HA are consistent with resp infection. No evidence of AVG infection although bacteremia always a possibility in HD patient.  SBP possibility as well - had paracentesis of 5 L in Mar 24 2015 with only 103 wbc and 1 % PMNS.  [name] this admit with only mild- mod ascites. HIV negative, no evidence transfusion reaction Paracentesis done and pending S/p 6 days vanco and zosyn  Recommendations Will stop abx and monitor    Thank you very much for the consult. Will follow with you.  [place], [place]   05/08/2015, 3:44  PM

## 2015-05-08 NOTE — Anesthesia Postprocedure Evaluation (Signed)
Anesthesia Post Note  Patient: Stanley [name]  Procedure(s) Performed: Procedure(s) (LRB): ESOPHAGOGASTRODUODENOSCOPY (EGD) (N/A)  Patient location during evaluation: PACU Anesthesia Type: General Level of consciousness: awake and alert Pain management: satisfactory to patient Vital Signs Assessment: post-procedure vital signs reviewed and stable Respiratory status: nonlabored ventilation Cardiovascular status: stable Anesthetic complications: no    Last Vitals:  Filed Vitals:   05/07/15 1944 05/08/15 0701  BP: 118/74 128/76  Pulse: 79 75  Temp: 36.8 C 36.7 C  Resp: 20 18    Last Pain:  Filed Vitals:   05/08/15 0701  PainSc: Asleep                 VAN STAVEREN,Kuper Rennels

## 2015-05-08 NOTE — Discharge Instructions (Signed)
Anemia, Nonspecific Anemia is a condition in which the concentration of red blood cells or hemoglobin in the blood is below normal. Hemoglobin is a substance in red blood cells that carries oxygen to the tissues of the body. Anemia results in not enough oxygen reaching these tissues.  CAUSES  Common causes of anemia include:   Excessive bleeding. Bleeding may be internal or external. This includes excessive bleeding from periods (in women) or from the intestine.   Poor nutrition.   Chronic kidney, thyroid, and liver disease.  Bone marrow disorders that decrease red blood cell production.  Cancer and treatments for cancer.  HIV, AIDS, and their treatments.  Spleen problems that increase red blood cell destruction.  Blood disorders.  Excess destruction of red blood cells due to infection, medicines, and autoimmune disorders. SIGNS AND SYMPTOMS   Minor weakness.   Dizziness.   Headache.  Palpitations.   Shortness of breath, especially with exercise.   Paleness.  Cold sensitivity.  Indigestion.  Nausea.  Difficulty sleeping.  Difficulty concentrating. Symptoms may occur suddenly or they may develop slowly.  DIAGNOSIS  Additional blood tests are often needed. These help your health care provider determine the best treatment. Your health care provider will check your stool for blood and look for other causes of blood loss.  TREATMENT  Treatment varies depending on the cause of the anemia. Treatment can include:   Supplements of iron, vitamin B12, or folic acid.   Hormone medicines.   A blood transfusion. This may be needed if blood loss is severe.   Hospitalization. This may be needed if there is significant continual blood loss.   Dietary changes.  Spleen removal. HOME CARE INSTRUCTIONS Keep all follow-up appointments. It often takes many weeks to correct anemia, and having your health care provider check on your condition and your response to  treatment is very important. SEEK IMMEDIATE MEDICAL CARE IF:   You develop extreme weakness, shortness of breath, or chest pain.   You become dizzy or have trouble concentrating.  You develop heavy vaginal bleeding.   You develop a rash.   You have bloody or black, tarry stools.   You faint.   You vomit up blood.   You vomit repeatedly.   You have abdominal pain.  You have a fever or persistent symptoms for more than 2-3 days.   You have a fever and your symptoms suddenly get worse.   You are dehydrated.  MAKE SURE YOU:  Understand these instructions.  Will watch your condition.  Will get help right away if you are not doing well or get worse.   This information is not intended to replace advice given to you by your health care provider. Make sure you discuss any questions you have with your health care provider.   Document Released: 04/07/2004 Document Revised: 10/31/2012 Document Reviewed: 08/24/2012 Elsevier Interactive Patient Education 2016 [employer]  

## 2015-05-09 DIAGNOSIS — Z992 Dependence on renal dialysis: Secondary | ICD-10-CM | POA: Diagnosis not present

## 2015-05-09 DIAGNOSIS — N186 End stage renal disease: Secondary | ICD-10-CM | POA: Diagnosis not present

## 2015-05-09 DIAGNOSIS — D509 Iron deficiency anemia, unspecified: Secondary | ICD-10-CM | POA: Diagnosis not present

## 2015-05-09 DIAGNOSIS — K31811 Angiodysplasia of stomach and duodenum with bleeding: Secondary | ICD-10-CM | POA: Diagnosis not present

## 2015-05-09 DIAGNOSIS — D631 Anemia in chronic kidney disease: Secondary | ICD-10-CM | POA: Diagnosis not present

## 2015-05-09 DIAGNOSIS — N2581 Secondary hyperparathyroidism of renal origin: Secondary | ICD-10-CM | POA: Diagnosis not present

## 2015-05-09 LAB — TRIGLYCERIDES, BODY FLUIDS: TRIGLYCERIDES FL: 48 mg/dL

## 2015-05-09 NOTE — Discharge Summary (Signed)
[facility] NAME: Stanley [name]    MR#:  [id number]  DATE OF BIRTH:  1960/04/19  DATE OF ADMISSION:  04/30/2015 ADMITTING PHYSICIAN: Vaughan Basta, MD  DATE OF DISCHARGE: 05/08/2015  4:39 PM  PRIMARY CARE PHYSICIAN: Elyse Jarvis, MD    ADMISSION DIAGNOSIS:  Gastrointestinal hemorrhage, unspecified gastritis, unspecified gastrointestinal hemorrhage type [K92.2]  DISCHARGE DIAGNOSIS:  Principal Problem:   Symptomatic anemia Active Problems:   GIB (gastrointestinal bleeding)   SECONDARY DIAGNOSIS:   Past Medical History  Diagnosis Date  . Coronary artery disease   . Hypertension   . GERD (gastroesophageal reflux disease)   . Suicidal ideation     & HOMICIDAL IDEATION --  06-16-2013   ADMITTED TO BEHAVIOR HEALTH  . Alcohol abuse   . Drug abuse   . Diabetic peripheral neuropathy ([place])   . End stage renal disease on dialysis [facility]) NEPHROLOGIST-   DR [facility]  IN [place]    HEMODIALYSIS --   TUES/  THURS/  SAT  . Cirrhosis [facility])     HOSPITAL COURSE:  55 y.o. male with a known history of coronary artery disease status post CABG, hypertension, gastroesophageal reflux disease, alcohol abuse and liver cirrhosis, end-stage renal disease on hemodialysis admitted for low Hb. Please see Dr Raliegh Ip dictated H & P for further details.  1. Symptomatic anemia secondary to acute on chronic upper GI bleed: Status post 3 unit transfusion, EGD on 2/23 done by Dr Candace Cruise showed No active bleeding. One medium sized gastric AVM seen which was cauterized.   2. Fever: likely flu like illness. tamiflu helped.  3. mild to moderate ascites - S/P ultrasound-guided paracentesis on 2/23  Patient was feeling much better on 2/23 with stable hemodynamics and no further fever. He was tolerating diet and was D/C home in stable condition. DISCHARGE CONDITIONS:   stable  CONSULTS OBTAINED:  Treatment Team:  Lavonia Dana, MD Adrian Prows, MD  DRUG ALLERGIES:  No Known Allergies  DISCHARGE MEDICATIONS:   Discharge Medication List as of 05/08/2015  2:06 PM    CONTINUE these medications which have NOT CHANGED   Details  aspirin 81 MG tablet Take 81 mg by mouth daily., Until Discontinued, Historical Med    atorvastatin (LIPITOR) 40 MG tablet Take 40 mg by mouth daily., Until Discontinued, Historical Med    benazepril (LOTENSIN) 10 MG tablet Take 10 mg by mouth daily., Until Discontinued, Historical Med    calcium acetate (PHOSLO) 667 MG capsule Take 2,001 mg by mouth 3 (three) times daily with meals., Until Discontinued, Historical Med    furosemide (LASIX) 40 MG tablet Take 1 tablet (40 mg total) by mouth daily., Starting 04/07/2015, Until Discontinued, Normal    gabapentin (NEURONTIN) 300 MG capsule Take 1 capsule (300 mg total) by mouth daily., Starting 03/27/2015, Until Discontinued, Normal    labetalol (NORMODYNE) 200 MG tablet Take 200 mg by mouth daily. , Until Discontinued, Historical Med    Multiple Vitamins-Minerals (MULTIVITAMIN WITH MINERALS) tablet Take 1 tablet by mouth daily., Until Discontinued, Historical Med    pantoprazole (PROTONIX) 40 MG tablet Take 1 tablet (40 mg total) by mouth daily., Starting 03/25/2015, Until Discontinued, Normal         DISCHARGE INSTRUCTIONS:    DIET:  Regular diet  DISCHARGE CONDITION:  Good  ACTIVITY:  Activity as tolerated  OXYGEN:  Home Oxygen: No.   Oxygen Delivery: room air  DISCHARGE LOCATION:  home   If you  experience worsening of your admission symptoms, develop shortness of breath, life threatening emergency, suicidal or homicidal thoughts you must seek medical attention immediately by calling 911 or calling your MD immediately  if symptoms less severe.  You Must read complete instructions/literature along with all the possible adverse reactions/side effects for all the Medicines you take and that have been prescribed to you. Take any  new Medicines after you have completely understood and accpet all the possible adverse reactions/side effects.   Please note  You were cared for by a hospitalist during your hospital stay. If you have any questions about your discharge medications or the care you received while you were in the hospital after you are discharged, you can call the unit and asked to speak with the hospitalist on call if the hospitalist that took care of you is not available. Once you are discharged, your primary care physician will handle any further medical issues. Please note that NO REFILLS for any discharge medications will be authorized once you are discharged, as it is imperative that you return to your primary care physician (or establish a relationship with a primary care physician if you do not have one) for your aftercare needs so that they can reassess your need for medications and monitor your lab values.    On the day of Discharge:  VITAL SIGNS:  Blood pressure 133/84, pulse 72, temperature 98.1 F (36.7 C), temperature source Oral, resp. rate 20, height 6\' 3"  (1.905 m), weight 85 kg (187 lb 6.3 oz), SpO2 98 %.  PHYSICAL EXAMINATION:  GENERAL:  55 y.o.-year-old patient lying in the bed with no acute distress.  EYES: Pupils equal, round, reactive to light and accommodation. No scleral icterus. Extraocular muscles intact.  HEENT: Head atraumatic, normocephalic. Oropharynx and nasopharynx clear.  NECK:  Supple, no jugular venous distention. No thyroid enlargement, no tenderness.  LUNGS: Normal breath sounds bilaterally, no wheezing, rales,rhonchi or crepitation. No use of accessory muscles of respiration.  CARDIOVASCULAR: S1, S2 normal. No murmurs, rubs, or gallops.  ABDOMEN: Soft, non-tender, non-distended. Bowel sounds present. No organomegaly or mass.  EXTREMITIES: No pedal edema, cyanosis, or clubbing.  NEUROLOGIC: Cranial nerves II through XII are intact. Muscle strength 5/5 in all extremities.  Sensation intact. Gait not checked.  PSYCHIATRIC: The patient is alert and oriented x 3.  SKIN: No obvious rash, lesion, or ulcer.  DATA REVIEW:   CBC  Recent Labs Lab 05/06/15 2018  WBC 5.7  HGB 8.0*  HCT 25.4*  PLT 204    Chemistries   Recent Labs Lab 05/06/15 0515 05/07/15 1448 05/08/15 1204  NA 140  --   --   K 4.6 4.2  --   CL 103  --   --   CO2 28  --   --   GLUCOSE 89  --   --   BUN 35*  --   --   CREATININE 6.57*  --   --   CALCIUM 8.2*  --   --   BILITOT  --   --  1.0    Cardiac Enzymes No results for input(s): TROPONINI in the last 168 hours.  Microbiology Results  Results for orders placed or performed during the hospital encounter of 04/30/15  MRSA PCR Screening     Status: None   Collection Time: 05/01/15  1:36 AM  Result Value Ref Range Status   MRSA by PCR NEGATIVE NEGATIVE Final    Comment:        The GeneXpert  MRSA Assay (FDA approved for NASAL specimens only), is one component of a comprehensive MRSA colonization surveillance program. It is not intended to diagnose MRSA infection nor to guide or monitor treatment for MRSA infections.   Culture, blood (Routine X 2) w Reflex to ID Panel     Status: None   Collection Time: 05/03/15  8:29 AM  Result Value Ref Range Status   Specimen Description BLOOD LEFT ARM  Final   Special Requests BOTTLES DRAWN AEROBIC AND ANAEROBIC  1CC  Final   Culture NO GROWTH 5 DAYS  Final   Report Status 05/08/2015 FINAL  Final  Culture, blood (Routine X 2) w Reflex to ID Panel     Status: None   Collection Time: 05/03/15  8:44 AM  Result Value Ref Range Status   Specimen Description BLOOD LEFT HAND  Final   Special Requests   Final    BOTTLES DRAWN AEROBIC AND ANAEROBIC  AER 6CC ANA 3CC   Culture NO GROWTH 5 DAYS  Final   Report Status 05/08/2015 FINAL  Final  MRSA PCR Screening     Status: None   Collection Time: 05/04/15 11:24 AM  Result Value Ref Range Status   MRSA by PCR NEGATIVE NEGATIVE Final     Comment:        The GeneXpert MRSA Assay (FDA approved for NASAL specimens only), is one component of a comprehensive MRSA colonization surveillance program. It is not intended to diagnose MRSA infection nor to guide or monitor treatment for MRSA infections.   Culture, blood (Routine X 2) w Reflex to ID Panel     Status: None (Preliminary result)   Collection Time: 05/05/15  8:53 PM  Result Value Ref Range Status   Specimen Description BLOOD LEFT ARM  Final   Special Requests BOTTLES DRAWN AEROBIC AND ANAEROBIC 8ML  Final   Culture NO GROWTH 4 DAYS  Final   Report Status PENDING  Incomplete  Culture, blood (Routine X 2) w Reflex to ID Panel     Status: None (Preliminary result)   Collection Time: 05/05/15  9:05 PM  Result Value Ref Range Status   Specimen Description BLOOD LEFT HAND  Final   Special Requests BOTTLES DRAWN AEROBIC AND ANAEROBIC 6ML  Final   Culture NO GROWTH 4 DAYS  Final   Report Status PENDING  Incomplete  Body fluid culture     Status: None (Preliminary result)   Collection Time: 05/08/15 11:10 AM  Result Value Ref Range Status   Specimen Description CYTO PERI  Final   Special Requests NONE  Final   Gram Stain PENDING  Incomplete   Culture NO GROWTH < 24 HOURS  Final   Report Status PENDING  Incomplete    RADIOLOGY:  US Paracentesis  05/08/2015  INDICATION: 55 year old male with a history of cirrhosis. He presents for repeat paracentesis given recurrent ascites EXAM: ULTRASOUND GUIDED  PARACENTESIS MEDICATIONS: None. COMPLICATIONS: None PROCEDURE: Informed written consent was obtained from the patient after a discussion of the risks, benefits and alternatives to treatment. A timeout was performed prior to the initiation of the procedure. Initial ultrasound scanning demonstrates a large amount of ascites within the right lower abdominal quadrant. The right lower abdomen was prepped and draped in the usual sterile fashion. 1% lidocaine with epinephrine was used  for local anesthesia. Following this, a Safe-T-Centesis catheter catheter was introduced. An ultrasound image was saved for documentation purposes. The paracentesis was performed. The catheter was removed and a dressing was  applied. The patient tolerated the procedure well without immediate post procedural complication. FINDINGS: A total of approximately 4.3 L of serosanguineous fluid was removed. Samples were sent to the laboratory as requested by the clinical team. IMPRESSION: Status post ultrasound-guided paracentesis. 4.3 L of serosanguineous fluid removed. Signed, Dulcy Fanny. Earleen Newport, DO [facility] Radiology Electronically Signed   By: Corrie Mckusick D.O.   On: 05/08/2015 13:02     Management plans discussed with the patient, family and they are in agreement.  CODE STATUS:  Code Status History    Date Active Date Inactive Code Status Order ID Comments User Context   04/30/2015  8:08 PM 05/08/2015  7:40 PM Full Code [id number]  Vaughan Basta, MD Inpatient   04/06/2015  5:07 AM 04/07/2015  5:36 PM Full Code [id number]  Lance Coon, MD Inpatient   03/23/2015 11:07 AM 03/27/2015 12:46 PM Full Code [id number]  Bettey Costa, MD Inpatient   06/19/2013 11:32 AM 06/21/2013  5:49 PM Full Code [id number]  Cristal Ford, DO Inpatient   06/16/2013  5:26 PM 06/19/2013 11:32 AM Full Code [id number]  Margarita Mail, PA-C ED   06/05/2013  3:33 PM 06/06/2013  6:56 PM Full Code [id number]  Theodoro Kos, DO Inpatient   06/02/2013  9:01 AM 06/05/2013  3:33 PM Full Code [id number]  Velvet Bathe, MD Inpatient   06/02/2013  8:40 AM 06/02/2013  9:01 AM Full Code [id number]  Velvet Bathe, MD Inpatient      TOTAL TIME TAKING CARE OF THIS PATIENT: 45 minutes.    [facility], Marsean Elkhatib M.D on 05/09/2015 at 3:11 PM  Between 7am to 6pm - Pager - [phone]  After 6pm go to [email or web address] - password [email or web address] [place]  Office  [phone]  CC: Primary care physician; Elyse Jarvis, MD Hulen Luster, MD Murlean Iba, MD  Note: This dictation was prepared with Dragon dictation along with smaller phrase technology. Any transcriptional errors that result from this process are unintentional.

## 2015-05-10 LAB — CULTURE, BLOOD (ROUTINE X 2)
CULTURE: NO GROWTH
Culture: NO GROWTH

## 2015-05-11 LAB — CYTOLOGY - NON PAP

## 2015-05-12 ENCOUNTER — Encounter: Payer: Self-pay | Admitting: Gastroenterology

## 2015-05-12 DIAGNOSIS — N186 End stage renal disease: Secondary | ICD-10-CM | POA: Diagnosis not present

## 2015-05-12 DIAGNOSIS — Z992 Dependence on renal dialysis: Secondary | ICD-10-CM | POA: Diagnosis not present

## 2015-05-12 DIAGNOSIS — D631 Anemia in chronic kidney disease: Secondary | ICD-10-CM | POA: Diagnosis not present

## 2015-05-12 DIAGNOSIS — K31811 Angiodysplasia of stomach and duodenum with bleeding: Secondary | ICD-10-CM | POA: Diagnosis not present

## 2015-05-12 DIAGNOSIS — D509 Iron deficiency anemia, unspecified: Secondary | ICD-10-CM | POA: Diagnosis not present

## 2015-05-12 DIAGNOSIS — N2581 Secondary hyperparathyroidism of renal origin: Secondary | ICD-10-CM | POA: Diagnosis not present

## 2015-05-12 LAB — BODY FLUID CULTURE: Culture: NO GROWTH

## 2015-05-13 DIAGNOSIS — N186 End stage renal disease: Secondary | ICD-10-CM | POA: Diagnosis not present

## 2015-05-13 DIAGNOSIS — Z992 Dependence on renal dialysis: Secondary | ICD-10-CM | POA: Diagnosis not present

## 2015-05-13 LAB — PATHOLOGY

## 2015-05-16 ENCOUNTER — Inpatient Hospital Stay
Admission: EM | Admit: 2015-05-16 | Discharge: 2015-05-17 | DRG: 377 | Disposition: A | Discharge status: Home / Self Care | Payer: Medicare Other | Attending: Internal Medicine | Admitting: Internal Medicine

## 2015-05-16 ENCOUNTER — Emergency Department: Payer: Medicare Other

## 2015-05-16 ENCOUNTER — Encounter: Payer: Self-pay | Admitting: Emergency Medicine

## 2015-05-16 DIAGNOSIS — K2100 Gastro-esophageal reflux disease with esophagitis, without bleeding: Secondary | ICD-10-CM | POA: Insufficient documentation

## 2015-05-16 DIAGNOSIS — F17210 Nicotine dependence, cigarettes, uncomplicated: Secondary | ICD-10-CM | POA: Diagnosis present

## 2015-05-16 DIAGNOSIS — I120 Hypertensive chronic kidney disease with stage 5 chronic kidney disease or end stage renal disease: Secondary | ICD-10-CM | POA: Diagnosis present

## 2015-05-16 DIAGNOSIS — D62 Acute posthemorrhagic anemia: Secondary | ICD-10-CM | POA: Diagnosis not present

## 2015-05-16 DIAGNOSIS — R1909 Other intra-abdominal and pelvic swelling, mass and lump: Secondary | ICD-10-CM | POA: Diagnosis not present

## 2015-05-16 DIAGNOSIS — Z992 Dependence on renal dialysis: Secondary | ICD-10-CM

## 2015-05-16 DIAGNOSIS — K2970 Gastritis, unspecified, without bleeding: Secondary | ICD-10-CM | POA: Diagnosis not present

## 2015-05-16 DIAGNOSIS — D631 Anemia in chronic kidney disease: Secondary | ICD-10-CM | POA: Diagnosis not present

## 2015-05-16 DIAGNOSIS — Z951 Presence of aortocoronary bypass graft: Secondary | ICD-10-CM | POA: Diagnosis not present

## 2015-05-16 DIAGNOSIS — I25810 Atherosclerosis of coronary artery bypass graft(s) without angina pectoris: Secondary | ICD-10-CM | POA: Diagnosis not present

## 2015-05-16 DIAGNOSIS — N186 End stage renal disease: Secondary | ICD-10-CM

## 2015-05-16 DIAGNOSIS — K769 Liver disease, unspecified: Secondary | ICD-10-CM | POA: Diagnosis not present

## 2015-05-16 DIAGNOSIS — Z7951 Long term (current) use of inhaled steroids: Secondary | ICD-10-CM

## 2015-05-16 DIAGNOSIS — K31811 Angiodysplasia of stomach and duodenum with bleeding: Principal | ICD-10-CM | POA: Insufficient documentation

## 2015-05-16 DIAGNOSIS — R7989 Other specified abnormal findings of blood chemistry: Secondary | ICD-10-CM | POA: Diagnosis not present

## 2015-05-16 DIAGNOSIS — Z79899 Other long term (current) drug therapy: Secondary | ICD-10-CM

## 2015-05-16 DIAGNOSIS — I2510 Atherosclerotic heart disease of native coronary artery without angina pectoris: Secondary | ICD-10-CM | POA: Diagnosis present

## 2015-05-16 DIAGNOSIS — E1142 Type 2 diabetes mellitus with diabetic polyneuropathy: Secondary | ICD-10-CM | POA: Diagnosis present

## 2015-05-16 DIAGNOSIS — R140 Abdominal distension (gaseous): Secondary | ICD-10-CM

## 2015-05-16 DIAGNOSIS — R188 Other ascites: Secondary | ICD-10-CM | POA: Diagnosis present

## 2015-05-16 DIAGNOSIS — K2961 Other gastritis with bleeding: Secondary | ICD-10-CM | POA: Diagnosis not present

## 2015-05-16 DIAGNOSIS — N19 Unspecified kidney failure: Secondary | ICD-10-CM | POA: Diagnosis not present

## 2015-05-16 DIAGNOSIS — Z7982 Long term (current) use of aspirin: Secondary | ICD-10-CM | POA: Diagnosis not present

## 2015-05-16 DIAGNOSIS — N2581 Secondary hyperparathyroidism of renal origin: Secondary | ICD-10-CM | POA: Diagnosis present

## 2015-05-16 DIAGNOSIS — K921 Melena: Secondary | ICD-10-CM | POA: Insufficient documentation

## 2015-05-16 DIAGNOSIS — E1122 Type 2 diabetes mellitus with diabetic chronic kidney disease: Secondary | ICD-10-CM | POA: Diagnosis present

## 2015-05-16 DIAGNOSIS — K7460 Unspecified cirrhosis of liver: Secondary | ICD-10-CM | POA: Diagnosis present

## 2015-05-16 DIAGNOSIS — K922 Gastrointestinal hemorrhage, unspecified: Secondary | ICD-10-CM

## 2015-05-16 DIAGNOSIS — Q2733 Arteriovenous malformation of digestive system vessel: Secondary | ICD-10-CM | POA: Diagnosis not present

## 2015-05-16 DIAGNOSIS — I10 Essential (primary) hypertension: Secondary | ICD-10-CM | POA: Diagnosis not present

## 2015-05-16 DIAGNOSIS — D500 Iron deficiency anemia secondary to blood loss (chronic): Secondary | ICD-10-CM | POA: Diagnosis present

## 2015-05-16 DIAGNOSIS — D649 Anemia, unspecified: Secondary | ICD-10-CM | POA: Diagnosis not present

## 2015-05-16 DIAGNOSIS — K209 Esophagitis, unspecified: Secondary | ICD-10-CM | POA: Diagnosis not present

## 2015-05-16 DIAGNOSIS — D638 Anemia in other chronic diseases classified elsewhere: Secondary | ICD-10-CM | POA: Diagnosis not present

## 2015-05-16 LAB — COMPREHENSIVE METABOLIC PANEL
ALBUMIN: 2.9 g/dL — AB (ref 3.5–5.0)
ALT: 26 U/L (ref 17–63)
ANION GAP: 12 (ref 5–15)
AST: 29 U/L (ref 15–41)
Alkaline Phosphatase: 249 U/L — ABNORMAL HIGH (ref 38–126)
BILIRUBIN TOTAL: 0.6 mg/dL (ref 0.3–1.2)
BUN: 86 mg/dL — AB (ref 6–20)
CHLORIDE: 105 mmol/L (ref 101–111)
CO2: 25 mmol/L (ref 22–32)
Calcium: 9 mg/dL (ref 8.9–10.3)
Creatinine, Ser: 9.83 mg/dL — ABNORMAL HIGH (ref 0.61–1.24)
GFR calc Af Amer: 6 mL/min — ABNORMAL LOW (ref >60)
GFR, EST NON AFRICAN AMERICAN: 5 mL/min — AB (ref >60)
Glucose, Bld: 101 mg/dL — ABNORMAL HIGH (ref 65–99)
POTASSIUM: 5.1 mmol/L (ref 3.5–5.1)
Sodium: 142 mmol/L (ref 135–145)
TOTAL PROTEIN: 6.8 g/dL (ref 6.5–8.1)

## 2015-05-16 LAB — CBC WITH DIFFERENTIAL/PLATELET
BASOS ABS: 0.1 10*3/uL (ref 0–0.1)
Basophils Relative: 1 %
EOS PCT: 2 %
Eosinophils Absolute: 0.2 10*3/uL (ref 0–0.7)
HCT: 20.6 % — ABNORMAL LOW (ref 40.0–52.0)
Hemoglobin: 6.5 g/dL — ABNORMAL LOW (ref 13.0–18.0)
LYMPHS PCT: 7 %
Lymphs Abs: 0.8 10*3/uL — ABNORMAL LOW (ref 1.0–3.6)
MCH: 29.3 pg (ref 26.0–34.0)
MCHC: 31.5 g/dL — ABNORMAL LOW (ref 32.0–36.0)
MCV: 93 fL (ref 80.0–100.0)
MONO ABS: 1 10*3/uL (ref 0.2–1.0)
Monocytes Relative: 9 %
Neutro Abs: 8.6 10*3/uL — ABNORMAL HIGH (ref 1.4–6.5)
Neutrophils Relative %: 81 %
PLATELETS: 323 10*3/uL (ref 150–440)
RBC: 2.22 MIL/uL — ABNORMAL LOW (ref 4.40–5.90)
RDW: 21.7 % — AB (ref 11.5–14.5)
WBC: 10.6 10*3/uL (ref 3.8–10.6)

## 2015-05-16 LAB — PREPARE RBC (CROSSMATCH)

## 2015-05-16 LAB — APTT: APTT: 30 s (ref 24–36)

## 2015-05-16 LAB — PHOSPHORUS: PHOSPHORUS: 5.4 mg/dL — AB (ref 2.5–4.6)

## 2015-05-16 LAB — LIPASE, BLOOD: LIPASE: 26 U/L (ref 11–51)

## 2015-05-16 LAB — PROTIME-INR
INR: 1.21
PROTHROMBIN TIME: 15.5 s — AB (ref 11.4–15.0)

## 2015-05-16 LAB — MAGNESIUM: Magnesium: 2.3 mg/dL (ref 1.7–2.4)

## 2015-05-16 IMAGING — CR DG CHEST 1V PORT
1 series · 1 of 1 positions shown · non-contrast
Comparison: 06/02/2013 and earlier.

CLINICAL DATA: 53-year-old male with shortness of Breath. Initial
encounter.

EXAM:
PORTABLE CHEST - 1 VIEW

[AP]
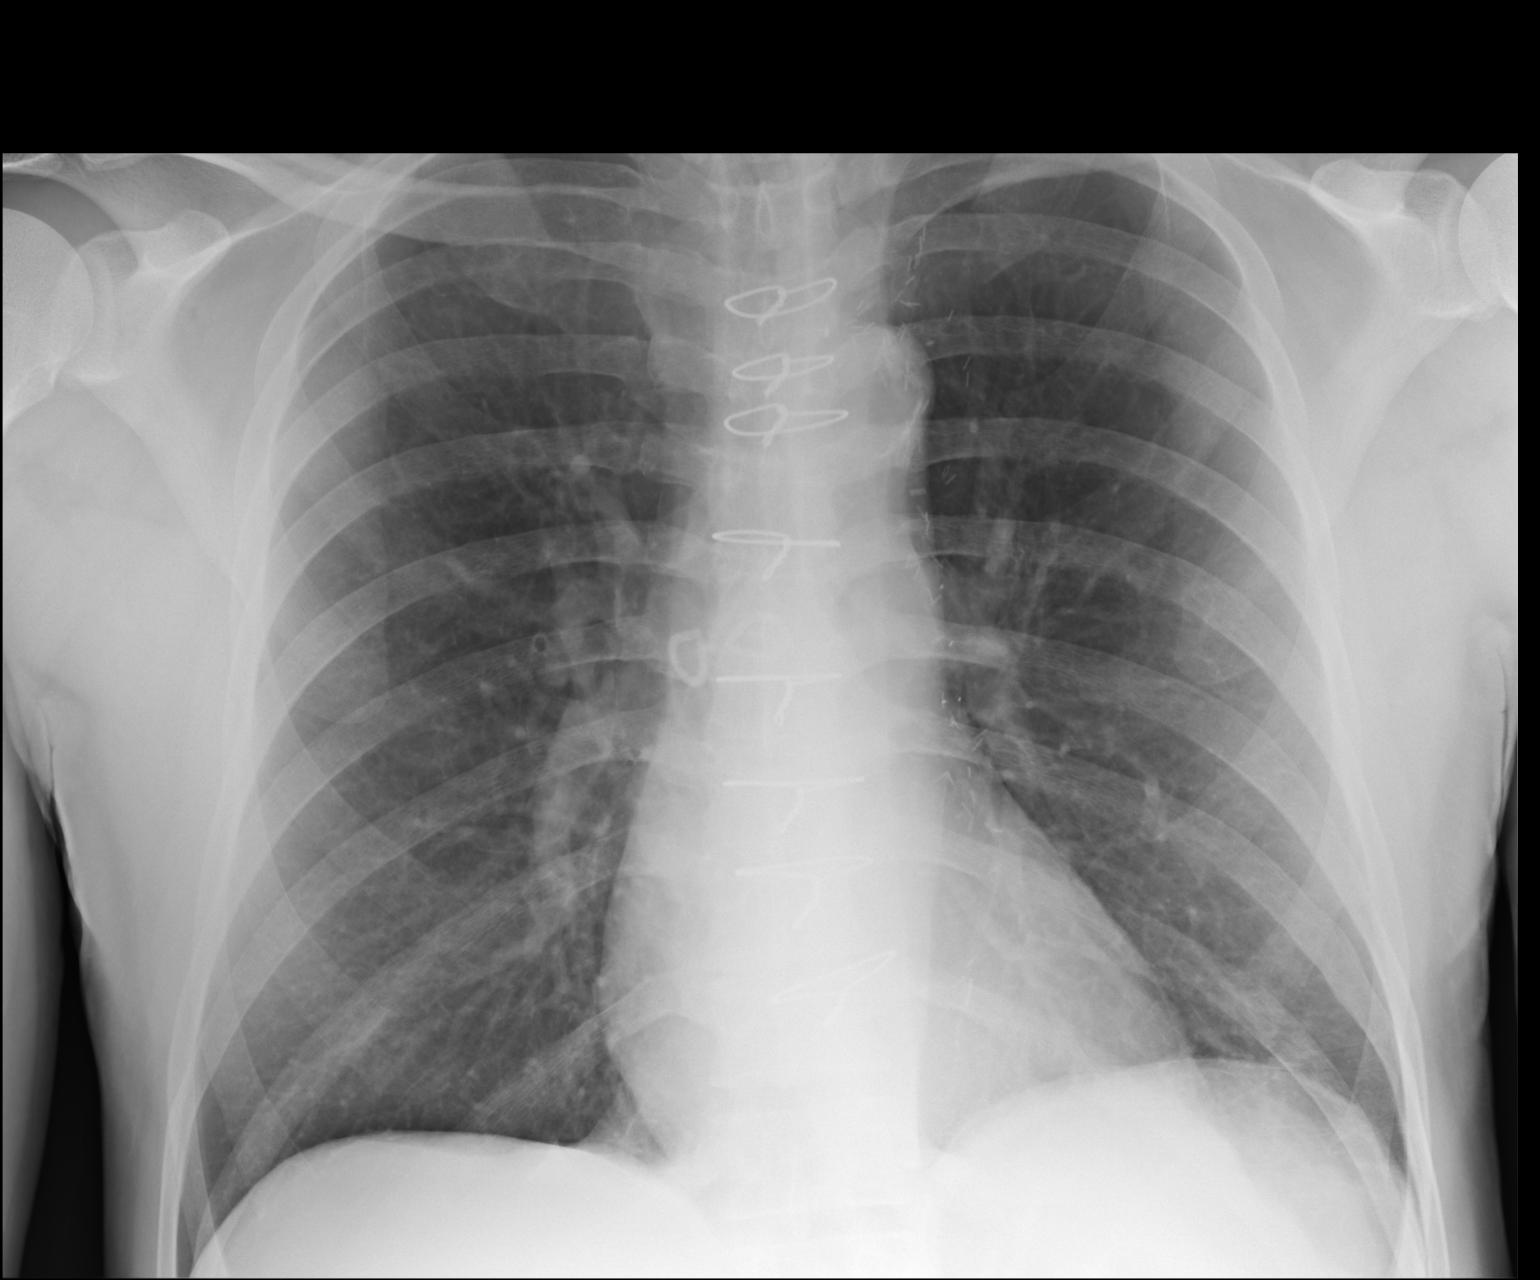

[1 of 1 positions shown; findings below may reference images not displayed]

FINDINGS: Portable AP semi upright view at 3111 hrs. Improved lung volumes.
Normal cardiac size and mediastinal contours. Sequelae of CABG.
Visualized tracheal air column is within normal limits. No
pneumothorax, pulmonary edema, pleural effusion or confluent
pulmonary opacity.
IMPRESSION: No acute cardiopulmonary abnormality.

## 2015-05-16 MED ORDER — ATORVASTATIN CALCIUM 20 MG PO TABS: 40.0000 mg | ORAL_TABLET | Freq: Every day | ORAL | Status: DC

## 2015-05-16 MED ORDER — ACETAMINOPHEN 650 MG RE SUPP: 650.0000 mg | Freq: Four times a day (QID) | RECTAL | Status: DC | PRN

## 2015-05-16 MED ORDER — ALUM & MAG HYDROXIDE-SIMETH 200-200-20 MG/5ML PO SUSP
30.0000 mL | Freq: Once | ORAL | Status: AC
  Administered 2015-05-16: 30 mL via ORAL
  Filled 2015-05-16: qty 30

## 2015-05-16 MED ORDER — SODIUM CHLORIDE 0.9% FLUSH
3.0000 mL | Freq: Two times a day (BID) | INTRAVENOUS | Status: DC
  Administered 2015-05-16: 3 mL via INTRAVENOUS

## 2015-05-16 MED ORDER — CALCIUM ACETATE (PHOS BINDER) 667 MG PO CAPS
2001.0000 mg | ORAL_CAPSULE | Freq: Three times a day (TID) | ORAL | Status: DC
  Administered 2015-05-17: 2001 mg via ORAL
  Filled 2015-05-16: qty 3

## 2015-05-16 MED ORDER — SODIUM CHLORIDE 0.9% FLUSH: 3.0000 mL | INTRAVENOUS | Status: DC | PRN

## 2015-05-16 MED ORDER — MORPHINE SULFATE (PF) 2 MG/ML IV SOLN
2.0000 mg | INTRAVENOUS | Status: DC | PRN
  Administered 2015-05-16 – 2015-05-17 (×3): 2 mg via INTRAVENOUS
  Filled 2015-05-16 (×4): qty 1

## 2015-05-16 MED ORDER — DOCUSATE SODIUM 100 MG PO CAPS
100.0000 mg | ORAL_CAPSULE | Freq: Two times a day (BID) | ORAL | Status: DC
  Administered 2015-05-16: 100 mg via ORAL
  Filled 2015-05-16: qty 1

## 2015-05-16 MED ORDER — PANTOPRAZOLE SODIUM 40 MG IV SOLR
40.0000 mg | Freq: Two times a day (BID) | INTRAVENOUS | Status: DC
  Administered 2015-05-16: 40 mg via INTRAVENOUS
  Filled 2015-05-16: qty 40

## 2015-05-16 MED ORDER — SODIUM CHLORIDE 0.9 % IV SOLN
10.0000 mL/h | Freq: Once | INTRAVENOUS | Status: AC
  Administered 2015-05-16: 10 mL/h via INTRAVENOUS

## 2015-05-16 MED ORDER — SODIUM CHLORIDE 0.9% FLUSH
3.0000 mL | Freq: Two times a day (BID) | INTRAVENOUS | Status: DC
  Administered 2015-05-16 – 2015-05-17 (×2): 3 mL via INTRAVENOUS

## 2015-05-16 MED ORDER — ONDANSETRON HCL 4 MG/2ML IJ SOLN: 4.0000 mg | Freq: Four times a day (QID) | INTRAMUSCULAR | Status: DC | PRN

## 2015-05-16 MED ORDER — ADULT MULTIVITAMIN W/MINERALS CH: 1.0000 | ORAL_TABLET | Freq: Every day | ORAL | Status: DC

## 2015-05-16 MED ORDER — LABETALOL HCL 200 MG PO TABS
200.0000 mg | ORAL_TABLET | Freq: Every day | ORAL | Status: DC
  Administered 2015-05-17: 200 mg via ORAL
  Filled 2015-05-16: qty 1

## 2015-05-16 MED ORDER — SODIUM CHLORIDE 0.9 % IV SOLN: 250.0000 mL | INTRAVENOUS | Status: DC | PRN

## 2015-05-16 MED ORDER — BENAZEPRIL HCL 20 MG PO TABS: 10.0000 mg | ORAL_TABLET | Freq: Every day | ORAL | Status: DC

## 2015-05-16 MED ORDER — GABAPENTIN 300 MG PO CAPS
300.0000 mg | ORAL_CAPSULE | Freq: Every day | ORAL | Status: DC
Start: 2015-05-16 — End: 2015-05-17

## 2015-05-16 MED ORDER — ACETAMINOPHEN 325 MG PO TABS: 650.0000 mg | ORAL_TABLET | Freq: Four times a day (QID) | ORAL | Status: DC | PRN

## 2015-05-16 MED ORDER — ONDANSETRON HCL 4 MG PO TABS: 4.0000 mg | ORAL_TABLET | Freq: Four times a day (QID) | ORAL | Status: DC | PRN

## 2015-05-16 MED ORDER — FUROSEMIDE 40 MG PO TABS: 40.0000 mg | ORAL_TABLET | Freq: Every day | ORAL | Status: DC

## 2015-05-16 MED ORDER — PANTOPRAZOLE SODIUM 40 MG PO TBEC: 40.0000 mg | DELAYED_RELEASE_TABLET | Freq: Every day | ORAL | Status: DC

## 2015-05-16 MED ORDER — PANTOPRAZOLE SODIUM 40 MG IV SOLR
40.0000 mg | Freq: Once | INTRAVENOUS | Status: AC
  Administered 2015-05-16: 40 mg via INTRAVENOUS
  Filled 2015-05-16: qty 40

## 2015-05-16 MED ORDER — BISACODYL 10 MG RE SUPP: 10.0000 mg | Freq: Every day | RECTAL | Status: DC | PRN

## 2015-05-16 NOTE — ED Notes (Signed)
Admitting MD at bedside.

## 2015-05-16 NOTE — H&P (Signed)
History and Physical    Stanley [name] [id number] DOB: 01-17-1961 DOA: 05/16/2015  Referring physician: Dr. Joni Fears PCP: Elyse Jarvis, MD  Specialists: none  Chief Complaint: black stools  HPI: Stanley [name] is a 55 y.o. male has a past medical history significant for ASCVD, ESRD on HD, and chronic anemia with hx of GIB now with weakness, fatigue and 2-3 day hx of "black" stools. Pt severely anemic in ER with guaiac positive stools. No fever. Denies CP or SOB. Has not had HD since Tuesday. No N/V or abdominal pain. He is now admitted  Review of Systems: The patient denies anorexia, fever, weight loss,, vision loss, decreased hearing, hoarseness, chest pain, syncope, dyspnea on exertion, peripheral edema, balance deficits, hemoptysis, abdominal pain, hematochezia, severe indigestion/heartburn, hematuria, incontinence, genital sores, muscle weakness, suspicious skin lesions, transient blindness, difficulty walking, depression, unusual weight change, abnormal bleeding, enlarged lymph nodes, angioedema, and breast masses.   Past Medical History  Diagnosis Date  . Coronary artery disease   . Hypertension   . GERD (gastroesophageal reflux disease)   . Suicidal ideation     & HOMICIDAL IDEATION --  06-16-2013   ADMITTED TO BEHAVIOR HEALTH  . Alcohol abuse   . Drug abuse   . Diabetic peripheral neuropathy ([place])   . End stage renal disease on dialysis [facility]) NEPHROLOGIST-   DR [facility]  IN [place]    HEMODIALYSIS --   TUES/  THURS/  SAT  . Cirrhosis [facility])    Past Surgical History  Procedure Laterality Date  . Dialysis fistula creation  LAST SURGERY  APPOX  2008  . Orif mandibular fracture N/A 06/05/2013    Procedure: REPAIR OF MANDIBULAR FRACTURE x 2 with maxillo-mandibular fixation ;  Surgeon: Theodoro Kos, DO;  Location: [place];  Service: Plastics;  Laterality: N/A;  . Coronary artery bypass graft  2008  ([place] , [place])    3 VESSEL  . Coronary angioplasty  ?    PT UNABLE  TO TELL IF  BEFORE OR AFTER  CABG  . Mandibular hardware removal N/A 07/29/2013    Procedure: REMOVAL OF ARCH BARS;  Surgeon: Theodoro Kos, DO;  Location: [place];  Service: Plastics;  Laterality: N/A;  . Esophagogastroduodenoscopy N/A 05/07/2015    Procedure: ESOPHAGOGASTRODUODENOSCOPY (EGD);  Surgeon: Hulen Luster, MD;  Location: [facility] ENDOSCOPY;  Service: Endoscopy;  Laterality: N/A;   Social History:  reports that he has been smoking Cigarettes.  He has been smoking about 0.00 packs per day for the past 40 years. He has never used smokeless tobacco. He reports that he does not drink alcohol or use illicit drugs.  No Known Allergies  Family History  Problem Relation Age of Onset  . Cancer Mother   . Cancer Father   . Cancer Sister     Prior to Admission medications   Medication Sig Start Date End Date Taking? Authorizing Provider  aspirin 81 MG tablet Take 81 mg by mouth daily.    Historical Provider, MD  atorvastatin (LIPITOR) 40 MG tablet Take 40 mg by mouth daily.    Historical Provider, MD  benazepril (LOTENSIN) 10 MG tablet Take 10 mg by mouth daily.    Historical Provider, MD  calcium acetate (PHOSLO) 667 MG capsule Take 2,001 mg by mouth 3 (three) times daily with meals.    Historical Provider, MD  furosemide (LASIX) 40 MG tablet Take 1 tablet (40 mg total) by mouth daily. 04/07/15   Bettey Costa, MD  gabapentin (  NEURONTIN) 300 MG capsule Take 1 capsule (300 mg total) by mouth daily. 03/27/15   Bettey Costa, MD  labetalol (NORMODYNE) 200 MG tablet Take 200 mg by mouth daily.     Historical Provider, MD  Multiple Vitamins-Minerals (MULTIVITAMIN WITH MINERALS) tablet Take 1 tablet by mouth daily.    Historical Provider, MD  pantoprazole (PROTONIX) 40 MG tablet Take 1 tablet (40 mg total) by mouth daily. 03/25/15   Bettey Costa, MD   Physical Exam: Filed Vitals:   05/16/15 1158 05/16/15 1201 05/16/15 1207 05/16/15 1228  BP: 117/65 117/65  103/58  Pulse: 84 78    Temp:   98 F (36.7 C)    Resp:  16  15  Height:  6\' 1"  (1.854 m)    SpO2: 100% 100% 100%      General:  No apparent distress, [place]/AT  Eyes: PERRL, EOMI, no scleral icterus  ENT: moist oropharynx, OP clear without exudate, dentition fair  Neck: supple, no lymphadenopathy. No JVD or bruits. No thyromegaly  Cardiovascular: regular rate without MRG; 2+ peripheral pulses, no JVD, trace peripheral edema  Respiratory: CTA biL, good air movement without wheezing, rhonchi or crackled  Abdomen: soft, non tender to palpation, positive bowel sounds, no guarding, no rebound. Stool guaiac positive per ER MD  Skin: no rashes or lesions  Musculoskeletal: normal bulk and tone, no joint swelling  Psychiatric: normal mood and affect, A&OX3  Neurologic: CN 2-12 grossly intact, Motor strength 5/5 in all 4 groups, sensory exam normal, DTR's symmetric  Labs on Admission:  Basic Metabolic Panel:  Recent Labs Lab 05/16/15 1222  NA 142  K 5.1  CL 105  CO2 25  GLUCOSE 101*  BUN 86*  CREATININE 9.83*  CALCIUM 9.0  MG 2.3  PHOS 5.4*   Liver Function Tests:  Recent Labs Lab 05/16/15 1222  AST 29  ALT 26  ALKPHOS 249*  BILITOT 0.6  PROT 6.8  ALBUMIN 2.9*    Recent Labs Lab 05/16/15 1222  LIPASE 26   No results for input(s): AMMONIA in the last 168 hours. CBC:  Recent Labs Lab 05/16/15 1222  WBC 10.6  NEUTROABS 8.6*  HGB 6.5*  HCT 20.6*  MCV 93.0  PLT 323   Cardiac Enzymes: No results for input(s): CKTOTAL, CKMB, CKMBINDEX, TROPONINI in the last 168 hours.  BNP (last 3 results)  Recent Labs  04/05/15 2240  BNP 2784.0*    ProBNP (last 3 results) No results for input(s): PROBNP in the last 8760 hours.  CBG: No results for input(s): GLUCAP in the last 168 hours.  Radiological Exams on Admission: Dg Abd 2 Views  05/16/2015  CLINICAL DATA:  Known liver disease.  Abdominal swelling. EXAM: ABDOMEN - 2 VIEW COMPARISON:  None. FINDINGS: The lung bases are normal other  than minimal atelectasis on the left. No free air, portal venous gas, or pneumatosis. Vascular stents are seen in the pelvis. Calcifications in the upper abdomen may be vascular. Renal stones are not excluded. No bowel obstruction. IMPRESSION: No bowel obstruction. Probable renal stones. Linear calcifications in the upper abdomen are probably vascular. Electronically Signed   By: Dorise Bullion III M.D   On: 05/16/2015 12:59    EKG: Independently reviewed.  Assessment/Plan Active Problems:   Coronary atherosclerosis of native coronary artery   ESRD on dialysis [facility])   GIB (gastrointestinal bleeding)   Symptomatic anemia   GI bleed   Will admit to the floor with IV protonix and clear liquid diet. Consult GI  and Nephrology. Transfuse 2u PRBC's per ER MD. Repeat labs in AM  Diet: clear liquids  Fluids: saline lock DVT Prophylaxis: none  Code Status: FULL  Family Communication: none  Disposition Plan: home  Time spent: 50 min

## 2015-05-16 NOTE — ED Provider Notes (Signed)
[facility] Emergency Department Provider Note  ____________________________________________  Time seen: 12:10 PM  I have reviewed the triage vital signs and the nursing notes.   HISTORY  Chief Complaint Anemia    HPI Stanley [name] is a 55 y.o. male sent to the ED due to very low hemoglobin of 5.6 on outpatient labs. Baseline is about 8.5. Patient denies any acute chest pain shortness of breath dizziness or syncope, no vomiting, but does report having black stools. Has been more tired recently. Has a history of severe anemia requiring transfusions.  He gets dialysis Tuesday Thursday Saturday. Last dialysis was 4 days ago on Tuesday. He went to dialysis today, but they are unable to begin because of his anemia.     Past Medical History  Diagnosis Date  . Coronary artery disease   . Hypertension   . GERD (gastroesophageal reflux disease)   . Suicidal ideation     & HOMICIDAL IDEATION --  06-16-2013   ADMITTED TO BEHAVIOR HEALTH  . Alcohol abuse   . Drug abuse   . Diabetic peripheral neuropathy ([place])   . End stage renal disease on dialysis [facility]) NEPHROLOGIST-   DR [facility]  IN [place]    HEMODIALYSIS --   TUES/  THURS/  SAT  . Cirrhosis [facility])      Patient Active Problem List   Diagnosis Date Noted  . Symptomatic anemia 04/30/2015  . HTN (hypertension) 04/06/2015  . GERD (gastroesophageal reflux disease) 04/06/2015  . HLD (hyperlipidemia) 04/06/2015  . Dyspnea 04/06/2015  . Cirrhosis ([place]) 04/06/2015  . Ascites 04/06/2015  . GIB (gastrointestinal bleeding) 03/23/2015  . Homicidal ideation 06/19/2013  . Suicidal intent 06/19/2013  . Homicidal ideations 06/19/2013  . Hyperkalemia 06/16/2013  . Mandible fracture ([place]) 06/05/2013  . Fracture, mandible ([place]) 06/02/2013  . Coronary atherosclerosis of native coronary artery 06/02/2013  . ESRD on dialysis ([place]) 06/02/2013  . Mandible open fracture ([place]) 06/02/2013     Past Surgical History   Procedure Laterality Date  . Dialysis fistula creation  LAST SURGERY  APPOX  2008  . Orif mandibular fracture N/A 06/05/2013    Procedure: REPAIR OF MANDIBULAR FRACTURE x 2 with maxillo-mandibular fixation ;  Surgeon: Theodoro Kos, DO;  Location: [place];  Service: Plastics;  Laterality: N/A;  . Coronary artery bypass graft  2008  ([place] , )    3 VESSEL  . Coronary angioplasty  ?    PT UNABLE TO TELL IF  BEFORE OR AFTER  CABG  . Mandibular hardware removal N/A 07/29/2013    Procedure: REMOVAL OF ARCH BARS;  Surgeon: Theodoro Kos, DO;  Location: [place];  Service: Plastics;  Laterality: N/A;  . Esophagogastroduodenoscopy N/A 05/07/2015    Procedure: ESOPHAGOGASTRODUODENOSCOPY (EGD);  Surgeon: Hulen Luster, MD;  Location: [facility] ENDOSCOPY;  Service: Endoscopy;  Laterality: N/A;     Current Outpatient Rx  Name  Route  Sig  Dispense  Refill  . aspirin 81 MG tablet   Oral   Take 81 mg by mouth daily.         Marland Kitchen atorvastatin (LIPITOR) 40 MG tablet   Oral   Take 40 mg by mouth daily.         . benazepril (LOTENSIN) 10 MG tablet   Oral   Take 10 mg by mouth daily.         . calcium acetate (PHOSLO) 667 MG capsule   Oral   Take 2,001 mg by mouth 3 (three)  times daily with meals.         . furosemide (LASIX) 40 MG tablet   Oral   Take 1 tablet (40 mg total) by mouth daily.   30 tablet   0   . gabapentin (NEURONTIN) 300 MG capsule   Oral   Take 1 capsule (300 mg total) by mouth daily.   30 capsule   0   . labetalol (NORMODYNE) 200 MG tablet   Oral   Take 200 mg by mouth daily.          . Multiple Vitamins-Minerals (MULTIVITAMIN WITH MINERALS) tablet   Oral   Take 1 tablet by mouth daily.         . pantoprazole (PROTONIX) 40 MG tablet   Oral   Take 1 tablet (40 mg total) by mouth daily.   30 tablet   0      Allergies Review of patient's allergies indicates no known allergies.   Family History  Problem Relation Age of Onset  . Cancer  Mother   . Cancer Father   . Cancer Sister     Social History Social History  Substance Use Topics  . Smoking status: Light Tobacco Smoker -- 0.00 packs/day for 40 years    Types: Cigarettes  . Smokeless tobacco: Never Used  . Alcohol Use: No    Review of Systems  Constitutional:   No fever or chills. No weight changes Eyes:   No blurry vision or double vision.  ENT:   No sore throat.  Cardiovascular:   No chest pain. Respiratory:   No dyspnea or cough. Gastrointestinal:   Negative for abdominal pain, vomiting and diarrhea.  Positive black stool. Genitourinary:   Negative for dysuria or difficulty urinating. Musculoskeletal:   Negative for back pain. No joint swelling or pain. Skin:   Negative for rash. Neurological:   Negative for headaches, focal weakness or numbness. Psychiatric:  No anxiety or depression.   Endocrine:  No changes in energy or sleep difficulty.  10-point ROS otherwise negative.  ____________________________________________   PHYSICAL EXAM:  VITAL SIGNS: ED Triage Vitals  Enc Vitals Group     BP 05/16/15 1158 117/65 mmHg     Pulse Rate 05/16/15 1158 84     Resp 05/16/15 1201 16     Temp 05/16/15 1201 98 F (36.7 C)     Temp src --      SpO2 05/16/15 1158 100 %     Weight --      Height 05/16/15 1201 6\' 1"  (1.854 m)     Head Cir --      Peak Flow --      Pain Score --      Pain Loc --      Pain Edu? --      Excl. in [place]? --     Vital signs reviewed, nursing assessments reviewed.   Constitutional:   Alert and oriented. Well appearing and in no distress. Eyes:   No scleral icterus. Positive conjunctival pallor. PERRL. EOMI ENT   Head:   Normocephalic and atraumatic.   Nose:   No congestion/rhinnorhea. No septal hematoma   Mouth/Throat:   Dry mucous membranes, no pharyngeal erythema. No peritonsillar mass.    Neck:   No stridor. No SubQ emphysema. No meningismus. Hematological/Lymphatic/Immunilogical:   No cervical  lymphadenopathy. Cardiovascular:   RRR. Symmetric bilateral radial and DP pulses.  No murmurs.  Respiratory:   Normal respiratory effort without tachypnea nor retractions. Breath sounds are  clear and equal bilaterally. No wheezes/rales/rhonchi. Gastrointestinal:   Soft and nontender. Non distended. There is no CVA tenderness.  No rebound, rigidity, or guarding. Rectal exam reveals brown stool, strongly Hemoccult-positive Genitourinary:   deferred Musculoskeletal:   Nontender with normal range of motion in all extremities. No joint effusions.  No lower extremity tenderness.  No edema. Neurologic:   Normal speech and language.  CN 2-10 normal. Motor grossly intact. No gross focal neurologic deficits are appreciated.  Skin:    Skin is warm, dry and intact. No rash noted.  No petechiae, purpura, or bullae. Psychiatric:   Mood and affect are normal. ____________________________________________    LABS (pertinent positives/negatives) (all labs ordered are listed, but only abnormal results are displayed) Labs Reviewed  CBC WITH DIFFERENTIAL/PLATELET - Abnormal; Notable for the following:    RBC 2.22 (*)    Hemoglobin 6.5 (*)    HCT 20.6 (*)    MCHC 31.5 (*)    RDW 21.7 (*)    Neutro Abs 8.6 (*)    Lymphs Abs 0.8 (*)    All other components within normal limits  COMPREHENSIVE METABOLIC PANEL - Abnormal; Notable for the following:    Glucose, Bld 101 (*)    BUN 86 (*)    Creatinine, Ser 9.83 (*)    Albumin 2.9 (*)    Alkaline Phosphatase 249 (*)    GFR calc non Af Amer 5 (*)    GFR calc Af Amer 6 (*)    All other components within normal limits  PHOSPHORUS - Abnormal; Notable for the following:    Phosphorus 5.4 (*)    All other components within normal limits  PROTIME-INR - Abnormal; Notable for the following:    Prothrombin Time 15.5 (*)    All other components within normal limits  LIPASE, BLOOD  MAGNESIUM  APTT  TYPE AND SCREEN  PREPARE RBC (CROSSMATCH)    ____________________________________________   EKG  Interpreted by me Sinus rhythm rate of 78, normal axis and intervals. Normal QRS. Normal ST segments. T wave inversions in all inferior leads. Unchanged from 04/05/2015  ____________________________________________    RADIOLOGY  X-ray abdomen 2 views unremarkable  ____________________________________________   PROCEDURES  CRITICAL CARE Performed by: Joni Fears, Beronica Lansdale   Total critical care time: 35 minutes  Critical care time was exclusive of separately billable procedures and treating other patients.  Critical care was necessary to treat or prevent imminent or life-threatening deterioration.  Critical care was time spent personally by me on the following activities: development of treatment plan with patient and/or surrogate as well as nursing, discussions with consultants, evaluation of patient's response to treatment, examination of patient, obtaining history from patient or surrogate, ordering and performing treatments and interventions, ordering and review of laboratory studies, ordering and review of radiographic studies, pulse oximetry and re-evaluation of patient's condition. ____________________________________________   INITIAL IMPRESSION / ASSESSMENT AND PLAN / ED COURSE  Pertinent labs & imaging results that were available during my care of the patient were reviewed by me and considered in my medical decision making (see chart for details).  Patient presents with likely severe anemia. Does not appear to have any acute symptoms other than fatigue, but does have an active GI bleed. We'll recheck labs to verify.  ----------------------------------------- 1:21 PM on 05/16/2015 -----------------------------------------  Hemoglobin found to be 6.5. With ongoing bleeding, we'll start transfusion. Transfusion risks and benefits discussed with the patient who verbally consents. Written consent obtained as well. He  is hemodynamically stable at this  point. We'll give him IV Protonix. Case discussed with Dr. Doy Hutching for admission. Case discussed with nephrology Dr. [name] to arrange dialysis since his BUN is greater than 80 and he is having ongoing bleeding.     ____________________________________________   FINAL CLINICAL IMPRESSION(S) / ED DIAGNOSES  Final diagnoses:  Abdominal distension  Acute GI bleeding  Severe anemia  Uremia      Carrie Mew, MD 05/16/15 1322

## 2015-05-16 NOTE — ED Notes (Signed)
Consent for blood transfusion obtained 

## 2015-05-16 NOTE — ED Notes (Signed)
Patient brought in by [facility] from [facility]. Patient has dialysis on Tuesday, Thursday and Saturday. Patients last treatment was Tuesday. When patient got to [facility] today he was told that his Hgb was too low to have his dialysis treatment and that he need to come to the ER for evaluation. Patients hemoglobin has dropped from 8.4 to 5.4 per dialysis. Per report, same thing happened about 3 weeks ago, patient was sent to ER for evaluation and found to have a "leaking vessel" in his liver that had to be cauterized.

## 2015-05-16 NOTE — ED Notes (Signed)
Dr. Joni Fears at bedside to perform rectal exam, patients stool positive for blood

## 2015-05-16 NOTE — Progress Notes (Signed)
Patient was admitted to [facility] from ED at 14:30 for low Hgb, black tarry stools, and dialysis.  Patient alert and oriented, continent of bowel and bladder.  No complaints of pain, general weakness noted. Patient currently at dialysis and receiving 2 units of blood per MD order. Haynes Hoehn RN.

## 2015-05-16 NOTE — ED Notes (Signed)
Patient reports having "fluid removed from abdomen" on Friday

## 2015-05-16 NOTE — ED Notes (Signed)
Called pharmacy and spoke with Anderson Malta and asked that 12 hour dose of Protonix be rescheduled since patient just received dose at 1334.

## 2015-05-16 NOTE — ED Notes (Addendum)
Patient sent from [facility] dialysis. When patient went in today for his regular scheduled treatment he was told that he was not able to get dialysis because his hemoglobin was too low. [facility] dialysis reports a drop in hemoglobin from 8.4 to 5.4. Patient denies shortness of breath, weakness, dizziness, chest pain, bloody emesis but does report black stool.   Patient was recently evaluated at our facility for similar problem and had to have a vessel on his liver cauterized due to bleeding. Patient states that he does not have any memory of this.

## 2015-05-16 NOTE — Consult Note (Signed)
[facility]  [address] [place],  [address] Phone: [phone] Fax : [phone]  Consultation  Referring Provider:     No ref. provider found Primary Care Physician:  Elyse Jarvis, MD Primary Gastroenterologist:  Dr. Candace Cruise         Reason for Consultation:     Melena and anemia  Date of Admission:  05/16/2015 Date of Consultation:  05/16/2015         HPI:   Stanley [name] is a 55 y.o. male who was admitted with 2-3 days of black stools and feeling weak and fatigued. The patient has a history of GI bleeding and reports that he has had black stools for the last year. The patient does take iron daily. 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 The patient also had a colonoscopy with polyps. The patient reports that the endoscopy found blood vessels in his GI tract that needed to be treated that were caused by his liver disease. From review of the report there is no signs of any varices. The patient also reports that he quit drinking 1 year ago but had been a heavy drinker before that. The patient also has a history of end-stage liver disease. He denies any chest pain shortness of breath or nausea and vomiting.  Past Medical History  Diagnosis Date  . Coronary artery disease   . Hypertension   . GERD (gastroesophageal reflux disease)   . Suicidal ideation     & HOMICIDAL IDEATION --  06-16-2013   ADMITTED TO BEHAVIOR HEALTH  . Alcohol abuse   . Drug abuse   . Diabetic peripheral neuropathy ([place])   . End stage renal disease on dialysis [facility]) NEPHROLOGIST-   DR [facility]  IN [place]    HEMODIALYSIS --   TUES/  THURS/  SAT  . Cirrhosis [facility])     Past Surgical History  Procedure Laterality Date  . Dialysis fistula creation  LAST SURGERY  APPOX  2008  . Orif mandibular fracture N/A 06/05/2013    Procedure: REPAIR OF MANDIBULAR FRACTURE x 2 with maxillo-mandibular fixation ;  Surgeon: Theodoro Kos, DO;   Location: [place];  Service: Plastics;  Laterality: N/A;  . Coronary artery bypass graft  2008  ([place] , [place])    3 VESSEL  . Coronary angioplasty  ?    PT UNABLE TO TELL IF  BEFORE OR AFTER  CABG  . Mandibular hardware removal N/A 07/29/2013    Procedure: REMOVAL OF ARCH BARS;  Surgeon: Theodoro Kos, DO;  Location: [place];  Service: Plastics;  Laterality: N/A;  . Esophagogastroduodenoscopy N/A 05/07/2015    Procedure: ESOPHAGOGASTRODUODENOSCOPY (EGD);  Surgeon: Hulen Luster, MD;  Location: [facility] ENDOSCOPY;  Service: Endoscopy;  Laterality: N/A;    Prior to Admission medications   Medication Sig Start Date End Date Taking? Authorizing Provider  aspirin EC 81 MG tablet Take 81 mg by mouth daily.   Yes Historical Provider, MD  atorvastatin (LIPITOR) 40 MG tablet Take 40 mg by mouth daily.   Yes Historical Provider, MD  benazepril (LOTENSIN) 10 MG tablet Take 10 mg by mouth daily.   Yes Historical Provider, MD  calcium acetate (PHOSLO) 667 MG capsule Take 2,001 mg by mouth 3 (three) times daily with meals.   Yes Historical Provider, MD  furosemide (LASIX) 40 MG tablet Take 1 tablet (40 mg total) by mouth daily. 04/07/15  Yes Bettey Costa, MD  gabapentin (NEURONTIN) 300 MG capsule Take 1 capsule (300 mg total) by mouth daily. 03/27/15  Yes Bettey Costa, MD  labetalol (NORMODYNE) 200 MG tablet Take 200 mg by mouth daily.    Yes Historical Provider, MD  Multiple Vitamins-Minerals (MULTIVITAMIN WITH MINERALS) tablet Take 1 tablet by mouth daily.   Yes Historical Provider, MD  pantoprazole (PROTONIX) 40 MG tablet Take 1 tablet (40 mg total) by mouth daily. 03/25/15  Yes Bettey Costa, MD  SYMBICORT 160-4.5 MCG/ACT inhaler Inhale 2 puffs into the lungs 2 (two) times daily. 04/01/15  Yes Historical Provider, MD    Family History  Problem Relation Age of Onset  . Cancer Mother   . Cancer Father   . Cancer Sister      Social History  Substance Use Topics  . Smoking status: Light Tobacco  Smoker -- 0.00 packs/day for 40 years    Types: Cigarettes  . Smokeless tobacco: Never Used  . Alcohol Use: No    Allergies as of 05/16/2015  . (No Known Allergies)    Review of Systems:    All systems reviewed and negative except where noted in HPI.   Physical Exam:  Vital signs in last 24 hours: Temp:  [97.9 F (36.6 C)-99.4 F (37.4 C)] 99.4 F (37.4 C) (03/04 2006) Pulse Rate:  [78-91] 85 (03/04 2006) Resp:  [15-21] 18 (03/04 2006) BP: (103-136)/(54-75) 110/63 mmHg (03/04 2006) SpO2:  [98 %-100 %] 98 % (03/04 2006) Weight:  [174 lb (78.926 kg)-178 lb 5.6 oz (80.9 kg)] 176 lb 5.9 oz (80 kg) (03/04 1930) Last BM Date: 05/15/15 General:   Pleasant, cooperative in NAD Head:  Normocephalic and atraumatic. Eyes:   No icterus.   Conjunctiva pink. PERRLA. Ears:  Normal auditory acuity. Neck:  Supple; no masses or thyroidomegaly Lungs: Respirations even and unlabored. Lungs clear to auscultation bilaterally.   No wheezes, crackles, or rhonchi.  Heart:  Regular rate and rhythm;  Without murmur, clicks, rubs or gallops Abdomen:  Soft, nondistended, nontender. Normal bowel sounds. No appreciable masses or hepatomegaly.  No rebound or guarding.  Rectal:  Not performed. Msk:  Symmetrical without gross deformities.    Extremities:  Without edema, cyanosis or clubbing. Neurologic:  Alert and oriented x3;  grossly normal neurologically. Skin:  Intact without significant lesions or rashes. Cervical Nodes:  No significant cervical adenopathy. Psych:  Alert and cooperative. Normal affect.  LAB RESULTS:  Recent Labs  05/16/15 1222  WBC 10.6  HGB 6.5*  HCT 20.6*  PLT 323   BMET  Recent Labs  05/16/15 1222  NA 142  K 5.1  CL 105  CO2 25  GLUCOSE 101*  BUN 86*  CREATININE 9.83*  CALCIUM 9.0   LFT  Recent Labs  05/16/15 1222  PROT 6.8  ALBUMIN 2.9*  AST 29  ALT 26  ALKPHOS 249*  BILITOT 0.6   PT/INR  Recent Labs  05/16/15 1222  LABPROT 15.5*  INR 1.21     STUDIES: Dg Abd 2 Views  05/16/2015  CLINICAL DATA:  Known liver disease.  Abdominal swelling. EXAM: ABDOMEN - 2 VIEW COMPARISON:  None. FINDINGS: The lung bases are normal other than minimal atelectasis on the left. No free air, portal venous gas, or pneumatosis. Vascular stents are seen in the pelvis. Calcifications in the upper abdomen may be vascular. Renal stones are not excluded. No bowel obstruction. IMPRESSION: No bowel obstruction. Probable renal stones. Linear calcifications in the upper abdomen are probably vascular. Electronically Signed   By:  Dorise Bullion III M.D   On: 05/16/2015 12:59      Impression / Plan:   Stanley [name] is a 55 y.o. y/o male with heme positive stools and anemia. The patient has black stools likely due to his iron but he was found to have blood in his stools and a low hemoglobin. The patient's anemia is likely multifactorial occluding his end-stage renal disease and GI bleeding. The patient had an AVM back in the middle of February on an upper endoscopy. The patient will be kept nothing by mouth and set up for repeat upper endoscopy for tomorrow.I have discussed risks & benefits which include, but are not limited to, bleeding, infection, perforation & drug reaction.  The patient agrees with this plan & written consent will be obtained.      Thank you for involving me in the care of this patient.      LOS: 0 days   Ollen Bowl, MD  05/16/2015, 10:47 PM   Note: This dictation was prepared with Dragon dictation along with smaller phrase technology. Any transcriptional errors that result from this process are unintentional.

## 2015-05-17 ENCOUNTER — Encounter: Payer: Self-pay | Admitting: Anesthesiology

## 2015-05-17 ENCOUNTER — Inpatient Hospital Stay: Payer: Medicare Other | Admitting: Anesthesiology

## 2015-05-17 ENCOUNTER — Encounter
Admission: EM | Disposition: A | Discharge status: Home / Self Care | Payer: Self-pay | Source: Home / Self Care | Attending: Internal Medicine

## 2015-05-17 DIAGNOSIS — K31811 Angiodysplasia of stomach and duodenum with bleeding: Principal | ICD-10-CM

## 2015-05-17 DIAGNOSIS — K2970 Gastritis, unspecified, without bleeding: Secondary | ICD-10-CM

## 2015-05-17 DIAGNOSIS — K921 Melena: Secondary | ICD-10-CM

## 2015-05-17 DIAGNOSIS — K2100 Gastro-esophageal reflux disease with esophagitis, without bleeding: Secondary | ICD-10-CM | POA: Insufficient documentation

## 2015-05-17 DIAGNOSIS — K209 Esophagitis, unspecified: Secondary | ICD-10-CM

## 2015-05-17 HISTORY — PX: ESOPHAGOGASTRODUODENOSCOPY (EGD) WITH PROPOFOL: SHX5813

## 2015-05-17 LAB — CBC
HEMATOCRIT: 23.1 % — AB (ref 40.0–52.0)
HEMOGLOBIN: 7.7 g/dL — AB (ref 13.0–18.0)
MCH: 29.9 pg (ref 26.0–34.0)
MCHC: 33.2 g/dL (ref 32.0–36.0)
MCV: 90.1 fL (ref 80.0–100.0)
Platelets: 293 10*3/uL (ref 150–440)
RBC: 2.56 MIL/uL — ABNORMAL LOW (ref 4.40–5.90)
RDW: 19.7 % — AB (ref 11.5–14.5)
WBC: 7.6 10*3/uL (ref 3.8–10.6)

## 2015-05-17 LAB — TYPE AND SCREEN
ABO/RH(D): A POS
Antibody Screen: NEGATIVE
UNIT DIVISION: 0
Unit division: 0

## 2015-05-17 LAB — COMPREHENSIVE METABOLIC PANEL
ALBUMIN: 2.6 g/dL — AB (ref 3.5–5.0)
ALT: 22 U/L (ref 17–63)
ANION GAP: 8 (ref 5–15)
AST: 23 U/L (ref 15–41)
Alkaline Phosphatase: 218 U/L — ABNORMAL HIGH (ref 38–126)
BUN: 47 mg/dL — AB (ref 6–20)
CHLORIDE: 105 mmol/L (ref 101–111)
CO2: 28 mmol/L (ref 22–32)
Calcium: 7.9 mg/dL — ABNORMAL LOW (ref 8.9–10.3)
Creatinine, Ser: 6.05 mg/dL — ABNORMAL HIGH (ref 0.61–1.24)
GFR calc Af Amer: 11 mL/min — ABNORMAL LOW (ref >60)
GFR calc non Af Amer: 9 mL/min — ABNORMAL LOW (ref >60)
GLUCOSE: 87 mg/dL (ref 65–99)
POTASSIUM: 4.8 mmol/L (ref 3.5–5.1)
Sodium: 141 mmol/L (ref 135–145)
Total Bilirubin: 0.9 mg/dL (ref 0.3–1.2)
Total Protein: 6.1 g/dL — ABNORMAL LOW (ref 6.5–8.1)

## 2015-05-17 LAB — PROTIME-INR
INR: 1.27
Prothrombin Time: 16 seconds — ABNORMAL HIGH (ref 11.4–15.0)

## 2015-05-17 LAB — HEPATITIS C ANTIBODY

## 2015-05-17 LAB — HEPATITIS B SURFACE ANTIGEN: HEP B S AG: NEGATIVE

## 2015-05-17 SURGERY — ESOPHAGOGASTRODUODENOSCOPY (EGD) WITH PROPOFOL
Anesthesia: General

## 2015-05-17 MED ORDER — IPRATROPIUM-ALBUTEROL 0.5-2.5 (3) MG/3ML IN SOLN
3.0000 mL | Freq: Once | RESPIRATORY_TRACT | Status: AC
  Administered 2015-05-17: 3 mL via RESPIRATORY_TRACT

## 2015-05-17 MED ORDER — LIDOCAINE HCL (CARDIAC) 20 MG/ML IV SOLN
INTRAVENOUS | Status: DC | PRN
  Administered 2015-05-17: 50 mg via INTRAVENOUS

## 2015-05-17 MED ORDER — PANTOPRAZOLE SODIUM 40 MG PO TBEC: 40.0000 mg | DELAYED_RELEASE_TABLET | Freq: Every day | ORAL | Status: DC

## 2015-05-17 MED ORDER — PROPOFOL 10 MG/ML IV BOLUS
INTRAVENOUS | Status: DC | PRN
  Administered 2015-05-17: 100 mg via INTRAVENOUS

## 2015-05-17 MED ORDER — GLYCOPYRROLATE 0.2 MG/ML IJ SOLN
INTRAMUSCULAR | Status: DC | PRN
  Administered 2015-05-17: 0.2 mg via INTRAVENOUS

## 2015-05-17 MED ORDER — SODIUM CHLORIDE 0.9 % IV SOLN
INTRAVENOUS | Status: DC
  Administered 2015-05-17: 1000 mL via INTRAVENOUS

## 2015-05-17 MED ORDER — PROPOFOL 500 MG/50ML IV EMUL
INTRAVENOUS | Status: DC | PRN
  Administered 2015-05-17: 120 ug/kg/min via INTRAVENOUS

## 2015-05-17 NOTE — Anesthesia Preprocedure Evaluation (Signed)
Anesthesia Evaluation  Patient identified by MRN, date of birth, ID band Patient awake    Reviewed: Allergy & Precautions, H&P , NPO status , Patient's Chart, lab work & pertinent test results  History of Anesthesia Complications Negative for: history of anesthetic complications  Airway Mallampati: III  TM Distance: >3 FB Neck ROM: limited    Dental  (+) Poor Dentition, Missing, Chipped   Pulmonary neg shortness of breath, Current Smoker,    Pulmonary exam normal breath sounds clear to auscultation       Cardiovascular Exercise Tolerance: Good hypertension, (-) angina+ CAD and + CABG  (-) Past MI and (-) DOE Normal cardiovascular exam Rhythm:regular Rate:Normal     Neuro/Psych  Neuromuscular disease negative psych ROS   GI/Hepatic Neg liver ROS, GERD  Controlled,  Endo/Other  diabetes, Type 2  Renal/GU DialysisRenal disease  negative genitourinary   Musculoskeletal   Abdominal   Peds  Hematology negative hematology ROS (+)   Anesthesia Other Findings Past Medical History:   Coronary artery disease                                      Hypertension                                                 GERD (gastroesophageal reflux disease)                       Suicidal ideation                                              Comment:& HOMICIDAL IDEATION --  06-16-2013   ADMITTED               TO BEHAVIOR HEALTH   Alcohol abuse                                                Drug abuse                                                   Diabetic peripheral neuropathy ([facility])                         End stage renal disease on dialysis ([facility])       NEPHROLOG*     Comment:HEMODIALYSIS --   TUES/  THURS/  SAT   Cirrhosis ([place])                                             Past Surgical History:   DIALYSIS FISTULA CREATION                        LAST  SURG*   ORIF MANDIBULAR FRACTURE                        N/A 06/05/2013   Comment:Procedure: REPAIR OF MANDIBULAR FRACTURE x 2               with maxillo-mandibular fixation ;  Surgeon:               Theodoro Kos, DO;  Location: [place];  Service:               Plastics;  Laterality: N/A;   CORONARY ARTERY BYPASS GRAFT                     2008  ([place]     Comment:3 VESSEL   CORONARY ANGIOPLASTY                             ?              Comment:PT UNABLE TO TELL IF  BEFORE OR AFTER  CABG   MANDIBULAR HARDWARE REMOVAL                     N/A 07/29/2013      Comment:Procedure: REMOVAL OF ARCH BARS;  Surgeon:               Theodoro Kos, DO;  Location: [place];  Service: Plastics;                Laterality: N/A;   ESOPHAGOGASTRODUODENOSCOPY                      N/A 05/07/2015      Comment:Procedure: ESOPHAGOGASTRODUODENOSCOPY (EGD);                Surgeon: Hulen Luster, MD;  Location[facility]               ENDOSCOPY;  Service: Endoscopy;  Laterality:               N/A;  BMI    Body Mass Index   23.27 kg/m 2      Reproductive/Obstetrics negative OB ROS                             Anesthesia Physical Anesthesia Plan  ASA: IV  Anesthesia Plan: General   Post-op Pain Management:    Induction:   Airway Management Planned:   Additional Equipment:   Intra-op Plan:   Post-operative Plan:   Informed Consent: I have reviewed the patients History and Physical, chart, labs and discussed the procedure including the risks, benefits and alternatives for the proposed anesthesia with the patient or authorized representative who has indicated his/her understanding and acceptance.   Dental Advisory Given  Plan Discussed with: Anesthesiologist, CRNA and Surgeon  Anesthesia Plan Comments:         Anesthesia Quick Evaluation

## 2015-05-17 NOTE — Progress Notes (Signed)
Subjective:  Patient missed his dialysis treatment on Thursday. Last Tuesday, outpatient hemoglobin was low therefore patient was referred to the hospital for evaluation Upon admission, his hemoglobin was found to be 6.5. He was given blood transfusion He was dialyzed on Saturday He underwent endoscopy today which showed the following.  Two 4 mm stigmata of recent bleeding angiodysplastic lesions were found in the cardia. Coagulation for hemostasis using argon beam at 2 liters/minute and 25 watts was successful.  Localized mild inflammation characterized by erythema was found in the gastric antrum. The examined duodenum was normal.   Objective:  Vital signs in last 24 hours:  Temp:  [97.5 F (36.4 C)-99.4 F (37.4 C)] 98.1 F (36.7 C) (03/05 1157) Pulse Rate:  [72-85] 76 (03/05 1338) Resp:  [15-18] 16 (03/05 1157) BP: (93-110)/(37-67) 95/56 mmHg (03/05 1338) SpO2:  [97 %-100 %] 97 % (03/05 1157) Weight:  [80 kg (176 lb 5.9 oz)-80.9 kg (178 lb 5.6 oz)] 80 kg (176 lb 5.9 oz) (03/04 1930)  Weight change:  Filed Weights   05/16/15 1440 05/16/15 1620 05/16/15 1930  Weight: 78.926 kg (174 lb) 80.9 kg (178 lb 5.6 oz) 80 kg (176 lb 5.9 oz)    Intake/Output:    Intake/Output Summary (Last 24 hours) at 05/17/15 1442 Last data filed at 05/17/15 1036  Gross per 24 hour  Intake    903 ml  Output    400 ml  Net    503 ml     Physical Exam: General:  laying in the bed, no distress   HEENT  moist oral mucous membranes   Neck  supple   Pulm/lungs  normal breathing effort, clear   CVS/Heart  regular, no rub or gallop   Abdomen:   soft, nontender, nondistended   Extremities: + dependent edema   Neurologic:  alert, oriented   Skin:  no acute rashes   Access:  AV fistula        Basic Metabolic Panel:   Recent Labs Lab 05/16/15 1222 05/17/15 0718  NA 142 141  K 5.1 4.8  CL 105 105  CO2 25 28  GLUCOSE 101* 87  BUN 86* 47*  CREATININE 9.83* 6.05*  CALCIUM 9.0 7.9*  MG  2.3  --   PHOS 5.4*  --      CBC:  Recent Labs Lab 05/16/15 1222 05/17/15 0718  WBC 10.6 7.6  NEUTROABS 8.6*  --   HGB 6.5* 7.7*  HCT 20.6* 23.1*  MCV 93.0 90.1  PLT 323 293      Microbiology:  Recent Results (from the past 720 hour(s))  MRSA PCR Screening     Status: None   Collection Time: 05/01/15  1:36 AM  Result Value Ref Range Status   MRSA by PCR NEGATIVE NEGATIVE Final    Comment:        The GeneXpert MRSA Assay (FDA approved for NASAL specimens only), is one component of a comprehensive MRSA colonization surveillance program. It is not intended to diagnose MRSA infection nor to guide or monitor treatment for MRSA infections.   Culture, blood (Routine X 2) w Reflex to ID Panel     Status: None   Collection Time: 05/03/15  8:29 AM  Result Value Ref Range Status   Specimen Description BLOOD LEFT ARM  Final   Special Requests BOTTLES DRAWN AEROBIC AND ANAEROBIC  1CC  Final   Culture NO GROWTH 5 DAYS  Final   Report Status 05/08/2015 FINAL  Final  Culture, blood (  Routine X 2) w Reflex to ID Panel     Status: None   Collection Time: 05/03/15  8:44 AM  Result Value Ref Range Status   Specimen Description BLOOD LEFT HAND  Final   Special Requests   Final    BOTTLES DRAWN AEROBIC AND ANAEROBIC  AER 6CC ANA 3CC   Culture NO GROWTH 5 DAYS  Final   Report Status 05/08/2015 FINAL  Final  MRSA PCR Screening     Status: None   Collection Time: 05/04/15 11:24 AM  Result Value Ref Range Status   MRSA by PCR NEGATIVE NEGATIVE Final    Comment:        The GeneXpert MRSA Assay (FDA approved for NASAL specimens only), is one component of a comprehensive MRSA colonization surveillance program. It is not intended to diagnose MRSA infection nor to guide or monitor treatment for MRSA infections.   Culture, blood (Routine X 2) w Reflex to ID Panel     Status: None   Collection Time: 05/05/15  8:53 PM  Result Value Ref Range Status   Specimen Description BLOOD  LEFT ARM  Final   Special Requests BOTTLES DRAWN AEROBIC AND ANAEROBIC 8ML  Final   Culture NO GROWTH 5 DAYS  Final   Report Status 05/10/2015 FINAL  Final  Culture, blood (Routine X 2) w Reflex to ID Panel     Status: None   Collection Time: 05/05/15  9:05 PM  Result Value Ref Range Status   Specimen Description BLOOD LEFT HAND  Final   Special Requests BOTTLES DRAWN AEROBIC AND ANAEROBIC 6ML  Final   Culture NO GROWTH 5 DAYS  Final   Report Status 05/10/2015 FINAL  Final  Body fluid culture     Status: None   Collection Time: 05/08/15 11:10 AM  Result Value Ref Range Status   Specimen Description CYTO PERI  Final   Special Requests NONE  Final   Gram Stain FEW WBC SEEN NO ORGANISMS SEEN   Final   Culture No growth aerobically or anaerobically.  Final   Report Status 05/12/2015 FINAL  Final    Coagulation Studies:  Recent Labs  05/16/15 1222 05/17/15 0718  LABPROT 15.5* 16.0*  INR 1.21 1.27    Urinalysis: No results for input(s): COLORURINE, LABSPEC, PHURINE, GLUCOSEU, HGBUR, BILIRUBINUR, KETONESUR, PROTEINUR, UROBILINOGEN, NITRITE, LEUKOCYTESUR in the last 72 hours.  Invalid input(s): APPERANCEUR    Imaging: Dg Abd 2 Views  05/16/2015  CLINICAL DATA:  Known liver disease.  Abdominal swelling. EXAM: ABDOMEN - 2 VIEW COMPARISON:  None. FINDINGS: The lung bases are normal other than minimal atelectasis on the left. No free air, portal venous gas, or pneumatosis. Vascular stents are seen in the pelvis. Calcifications in the upper abdomen may be vascular. Renal stones are not excluded. No bowel obstruction. IMPRESSION: No bowel obstruction. Probable renal stones. Linear calcifications in the upper abdomen are probably vascular. Electronically Signed   By: Dorise Bullion III M.D   On: 05/16/2015 12:59     Medications:     . atorvastatin  40 mg Oral Daily  . benazepril  10 mg Oral Daily  . calcium acetate  2,001 mg Oral TID WC  . docusate sodium  100 mg Oral BID  .  furosemide  40 mg Oral Daily  . gabapentin  300 mg Oral Daily  . labetalol  200 mg Oral Daily  . multivitamin with minerals  1 tablet Oral Daily  . pantoprazole (PROTONIX) IV  40 mg  Intravenous Q12H  . sodium chloride flush  3 mL Intravenous Q12H   acetaminophen **OR** acetaminophen, bisacodyl, morphine injection, ondansetron **OR** ondansetron (ZOFRAN) IV  Assessment/ Plan:  55 y.o. African-American male with end-stage renal disease, dialysis at [facility], coronary disease, hypertension, history of alcohol abuse, history of drug abuse, bleeding angiodysplastic lesions in the stomach 04/2015  [facility] [address] TTS  1. End-stage renal disease:  - continue TTS schedule.  - Dialyzed Saturday  2. Anemia of chronic kidney disease and recent GI bleed.  - Status post PRBC transfusion - Upper endoscopy shows bleeding angiodysplastic lesions in the stomach - Avoid anticoagulation during dialysis as outpatient  3. Secondary hyperparathyroidism: - monitor ca/phos during hospitalization - Recent parathyroid gland biopsy  4. Ascites from liver cirrhosis: status post paracentesis periodically as needed    LOS: 1 Bethanny Toelle 3/5/20172:42 PM

## 2015-05-17 NOTE — Transfer of Care (Signed)
Immediate Anesthesia Transfer of Care Note  Patient: Stanley [name]  Procedure(s) Performed: Procedure(s): ESOPHAGOGASTRODUODENOSCOPY (EGD) WITH PROPOFOL (N/A)  Patient Location: Endoscopy Unit  Anesthesia Type:General  Level of Consciousness: awake  Airway & Oxygen Therapy: Patient Spontanous Breathing and Patient connected to nasal cannula oxygen  Post-op Assessment: Report given to RN  Post vital signs: Reviewed  Last Vitals:  Filed Vitals:   05/17/15 0612 05/17/15 1034  BP: 109/67 95/67  Pulse: 78 76  Temp: 37.2 C 36.4 C  Resp: 16 15    Complications: No apparent anesthesia complications

## 2015-05-17 NOTE — Clinical Social Work Note (Signed)
MD requested a taxi voucher for patient. CSW met with patient who stated that all of his family is out of town currently and that he does not have anyone that can come transport him home. Patient verified with CSW that he has a key to get in his home and patient is ambulatory without assistance. Taxi voucher provided. Shela Leff MSW,LCSW [phone]

## 2015-05-17 NOTE — Discharge Summary (Signed)
[place] [facility] [place] NAME: Stanley [name]    MR#:  [id number]  DATE OF BIRTH:  30-Jul-1960  DATE OF ADMISSION:  05/16/2015 ADMITTING PHYSICIAN: Idelle Crouch, MD  DATE OF DISCHARGE: 05/17/15  PRIMARY CARE PHYSICIAN: Elyse Jarvis, MD    ADMISSION DIAGNOSIS:  Abdominal distension [R14.0] Uremia [N19] Acute GI bleeding [K92.2] Severe anemia [D64.9]  DISCHARGE DIAGNOSIS:  Anemia of chronic dz due to slow GI bleed from fastric AVM s/p cauterization ESRD on HD HTN  SECONDARY DIAGNOSIS:   Past Medical History  Diagnosis Date  . Coronary artery disease   . Hypertension   . GERD (gastroesophageal reflux disease)   . Suicidal ideation     & HOMICIDAL IDEATION --  06-16-2013   ADMITTED TO BEHAVIOR HEALTH  . Alcohol abuse   . Drug abuse   . Diabetic peripheral neuropathy ([place])   . End stage renal disease on dialysis [facility]) NEPHROLOGIST-   DR [facility]  IN [place]    HEMODIALYSIS --   TUES/  THURS/  SAT  . Cirrhosis ([place])     HOSPITAL COURSE:  55 y.o. African-American male with end-stage renal disease on dialysis , coronary disease, hypertension, history of alcohol abuse, history of drug abuse, bleeding angiodysplastic lesions in the stomach 04/2015   1 Anemia of chronic kidney disease and recent GI bleed.  - Status post PRBC transfusion hemoglobin was 6.5---7.7 - Upper endoscopy shows bleeding angiodysplastic lesions in the stomach treated with argon laser therapy. Continue PPI. No further workup per GI - Avoid anticoagulation during dialysis as outpatient  2. End-stage renal disease:  - continue TTS schedule.  - Dialyzed Saturday  3. Hypertension continue benazepril and labetalol  4. Ascites from liver cirrhosis: status post paracentesis periodically as needed  Overall feels better. Pt requesting to go home. Vitals stable D/c home  CONSULTS OBTAINED:  Treatment Team:  [name], MD Murlean Iba, MD  DRUG  ALLERGIES:  No Known Allergies  DISCHARGE MEDICATIONS:   Current Discharge Medication List    CONTINUE these medications which have CHANGED   Details  pantoprazole (PROTONIX) 40 MG tablet Take 1 tablet (40 mg total) by mouth daily. Qty: 30 tablet, Refills: 2      CONTINUE these medications which have NOT CHANGED   Details  aspirin EC 81 MG tablet Take 81 mg by mouth daily.    atorvastatin (LIPITOR) 40 MG tablet Take 40 mg by mouth daily.    benazepril (LOTENSIN) 10 MG tablet Take 10 mg by mouth daily.    calcium acetate (PHOSLO) 667 MG capsule Take 2,001 mg by mouth 3 (three) times daily with meals.    furosemide (LASIX) 40 MG tablet Take 1 tablet (40 mg total) by mouth daily. Qty: 30 tablet, Refills: 0    gabapentin (NEURONTIN) 300 MG capsule Take 1 capsule (300 mg total) by mouth daily. Qty: 30 capsule, Refills: 0    labetalol (NORMODYNE) 200 MG tablet Take 200 mg by mouth daily.     Multiple Vitamins-Minerals (MULTIVITAMIN WITH MINERALS) tablet Take 1 tablet by mouth daily.    SYMBICORT 160-4.5 MCG/ACT inhaler Inhale 2 puffs into the lungs 2 (two) times daily. Refills: 3        If you experience worsening of your admission symptoms, develop shortness of breath, life threatening emergency, suicidal or homicidal thoughts you must seek medical attention immediately by calling 911 or calling your MD immediately  if symptoms less severe.  You Must read  complete instructions/literature along with all the possible adverse reactions/side effects for all the Medicines you take and that have been prescribed to you. Take any new Medicines after you have completely understood and accept all the possible adverse reactions/side effects.   Please note  You were cared for by a hospitalist during your hospital stay. If you have any questions about your discharge medications or the care you received while you were in the hospital after you are discharged, you can call the unit and asked  to speak with the hospitalist on call if the hospitalist that took care of you is not available. Once you are discharged, your primary care physician will handle any further medical issues. Please note that NO REFILLS for any discharge medications will be authorized once you are discharged, as it is imperative that you return to your primary care physician (or establish a relationship with a primary care physician if you do not have one) for your aftercare needs so that they can reassess your need for medications and monitor your lab values.    DATA REVIEW:   CBC   Recent Labs Lab 05/17/15 0718  WBC 7.6  HGB 7.7*  HCT 23.1*  PLT 293    Chemistries   Recent Labs Lab 05/16/15 1222 05/17/15 0718  NA 142 141  K 5.1 4.8  CL 105 105  CO2 25 28  GLUCOSE 101* 87  BUN 86* 47*  CREATININE 9.83* 6.05*  CALCIUM 9.0 7.9*  MG 2.3  --   AST 29 23  ALT 26 22  ALKPHOS 249* 218*  BILITOT 0.6 0.9    Microbiology Results   Recent Results (from the past 240 hour(s))  Body fluid culture     Status: None   Collection Time: 05/08/15 11:10 AM  Result Value Ref Range Status   Specimen Description CYTO PERI  Final   Special Requests NONE  Final   Gram Stain FEW WBC SEEN NO ORGANISMS SEEN   Final   Culture No growth aerobically or anaerobically.  Final   Report Status 05/12/2015 FINAL  Final    RADIOLOGY:  Dg Abd 2 Views  05/16/2015  CLINICAL DATA:  Known liver disease.  Abdominal swelling. EXAM: ABDOMEN - 2 VIEW COMPARISON:  None. FINDINGS: The lung bases are normal other than minimal atelectasis on the left. No free air, portal venous gas, or pneumatosis. Vascular stents are seen in the pelvis. Calcifications in the upper abdomen may be vascular. Renal stones are not excluded. No bowel obstruction. IMPRESSION: No bowel obstruction. Probable renal stones. Linear calcifications in the upper abdomen are probably vascular. Electronically Signed   By: Dorise Bullion III M.D   On: 05/16/2015  12:59     Management plans discussed with the patient, family and they are in agreement.  CODE STATUS:     Code Status Orders        Start     Ordered   05/16/15 1418  Full code   Continuous     05/16/15 1417    Code Status History    Date Active Date Inactive Code Status Order ID Comments User Context   04/30/2015  8:08 PM 05/08/2015  7:40 PM Full Code [id number]  Vaughan Basta, MD Inpatient   04/06/2015  5:07 AM 04/07/2015  5:36 PM Full Code [id number]  Lance Coon, MD Inpatient   03/23/2015 11:07 AM 03/27/2015 12:46 PM Full Code [id number]  Bettey Costa, MD Inpatient   06/19/2013 11:32 AM 06/21/2013  5:49 PM Full  Code [id number]  Cristal Ford, DO Inpatient   06/16/2013  5:26 PM 06/19/2013 11:32 AM Full Code [id number]  Margarita Mail, PA-C ED   06/05/2013  3:33 PM 06/06/2013  6:56 PM Full Code [id number]  Theodoro Kos, DO Inpatient   06/02/2013  9:01 AM 06/05/2013  3:33 PM Full Code [id number]  Velvet Bathe, MD Inpatient   06/02/2013  8:40 AM 06/02/2013  9:01 AM Full Code [id number]  Velvet Bathe, MD Inpatient      TOTAL TIME TAKING CARE OF THIS PATIENT: 40* minutes.    Jamye Balicki M.D on 05/17/2015 at 3:31 PM  Between 7am to 6pm - Pager - [phone] After 6pm go to [email or web address] - password [email or web address] [facility]  Office  [phone]  CC: Primary care physician; Elyse Jarvis, MD

## 2015-05-17 NOTE — Progress Notes (Signed)
Discharge orders received.  Reviewed the instructions with the patient who verbalized understanding. A copy of the instructions were provided to the patient. The patient stated he did not have a ride home so a taxi voucher was provided. However, he became irate cursing and yelling because the taxi company said it would be a two hour wait for the first available taxi. He left the unit yelling and cursing saying he was going to walk. Staff sent down stairs to check on him but he could not be located. The patient also refused to take the copy of his discharge instruction with him saying he did not want them. The copy was placed in the patient's chart. No acute changes noted at the time when the patient left the unit.

## 2015-05-17 NOTE — Progress Notes (Addendum)
Patient ID: Stanley [name], male   DOB: [birth year], 55 y.o.   MRN: [id number] [place] [facility] [place] NAME: Stanley [name]    MR#:  [id number]  DATE OF BIRTH:  1960-09-17  SUBJECTIVE:  Came in with weakness and found to have hgb of 6.5  REVIEW OF SYSTEMS:   Review of Systems  Constitutional: Negative for fever, chills and weight loss.  HENT: Negative for ear discharge, ear pain and nosebleeds.   Eyes: Negative for blurred vision, pain and discharge.  Respiratory: Negative for sputum production, shortness of breath, wheezing and stridor.   Cardiovascular: Negative for chest pain, palpitations, orthopnea and PND. yes Gastrointestinal: Positive for blood in stool and melena. Negative for nausea, vomiting, abdominal pain and diarrhea.  Genitourinary: Negative for urgency and frequency.  Musculoskeletal: Negative for back pain and joint pain.  Neurological: Positive for weakness. Negative for sensory change, speech change and focal weakness.  Psychiatric/Behavioral: Negative for depression and hallucinations. The patient is not nervous/anxious.   All other systems reviewed and are negative.  Tolerating Diet:yes   DRUG ALLERGIES:  No Known Allergies  VITALS:  Blood pressure 95/56, pulse 76, temperature 98.1 F (36.7 C), temperature source Oral, resp. rate 16, height 6\' 1"  (1.854 m), weight 80 kg (176 lb 5.9 oz), SpO2 97 %.  PHYSICAL EXAMINATION:   Physical Exam  GENERAL:  55 y.o.-year-old patient lying in the bed with no acute distress.  EYES: Pupils equal, round, reactive to light and accommodation. No scleral icterus. Extraocular muscles intact.  HEENT: Head atraumatic, normocephalic. Oropharynx and nasopharynx clear.  NECK:  Supple, no jugular venous distention. No thyroid enlargement, no tenderness.  LUNGS: Normal breath sounds bilaterally, no wheezing, rales, rhonchi. No use of accessory muscles of respiration.   CARDIOVASCULAR: S1, S2 normal. No murmurs, rubs, or gallops.  ABDOMEN: Soft, nontender, nondistended. Bowel sounds present. No organomegaly or mass.  EXTREMITIES: No cyanosis, clubbing or edema b/l.    NEUROLOGIC: Cranial nerves II through XII are intact. No focal Motor or sensory deficits b/l.   PSYCHIATRIC:  patient is alert and oriented x 3.  SKIN: No obvious rash, lesion, or ulcer.   LABORATORY PANEL:  CBC  Recent Labs Lab 05/17/15 0718  WBC 7.6  HGB 7.7*  HCT 23.1*  PLT 293    Chemistries   Recent Labs Lab 05/16/15 1222 05/17/15 0718  NA 142 141  K 5.1 4.8  CL 105 105  CO2 25 28  GLUCOSE 101* 87  BUN 86* 47*  CREATININE 9.83* 6.05*  CALCIUM 9.0 7.9*  MG 2.3  --   AST 29 23  ALT 26 22  ALKPHOS 249* 218*  BILITOT 0.6 0.9   Cardiac Enzymes No results for input(s): TROPONINI in the last 168 hours. RADIOLOGY:  Dg Abd 2 Views  05/16/2015  CLINICAL DATA:  Known liver disease.  Abdominal swelling. EXAM: ABDOMEN - 2 VIEW COMPARISON:  None. FINDINGS: The lung bases are normal other than minimal atelectasis on the left. No free air, portal venous gas, or pneumatosis. Vascular stents are seen in the pelvis. Calcifications in the upper abdomen may be vascular. Renal stones are not excluded. No bowel obstruction. IMPRESSION: No bowel obstruction. Probable renal stones. Linear calcifications in the upper abdomen are probably vascular. Electronically Signed   By: Dorise Bullion III M.D   On: 05/16/2015 12:59   ASSESSMENT AND PLAN:   54 y.o. African-American male with end-stage renal disease on dialysis , coronary  disease, hypertension, history of alcohol abuse, history of drug abuse, bleeding angiodysplastic lesions in the stomach 04/2015   1 Anemia of chronic kidney disease and recent GI bleed.  - Status post PRBC transfusion hemoglobin was 6.5---7.7 - Upper endoscopy shows bleeding angiodysplastic lesions in the stomach treated with argon laser therapy. Continue PPI. No  further workup per GI - Avoid anticoagulation during dialysis as outpatient  2. End-stage renal disease:  - continue TTS schedule.  - Dialyzed Saturday  3. Hypertension continue benazepril and labetalol  4. Ascites from liver cirrhosis: status post paracentesis periodically as needed  Case discussed with Care Management/Social Worker. Management plans discussed with the patient, family and they are in agreement.  CODE STATUS: Full  DVT Prophylaxis: Mechanical with SCD and TED hose due to GI bleed  TOTAL TIME TAKING CARE OF THIS PATIENT: 30 minutes.  >50% time spent on counselling and coordination of care GI and nephrology  POSSIBLE D/C IN morning  DEPENDING ON CLINICAL CONDITION.  Note: This dictation was prepared with Dragon dictation along with smaller phrase technology. Any transcriptional errors that result from this process are unintentional.  Ivanell Deshotel M.D on 05/17/2015 at 2:51 PM  Between 7am to 6pm - Pager - [phone]  After 6pm go to [email or web address] - password [email or web address] [facility]  Office  [phone]  CC: Primary care physician; Elyse Jarvis, MD

## 2015-05-17 NOTE — Anesthesia Postprocedure Evaluation (Signed)
Anesthesia Post Note  Patient: Stanley [name]  Procedure(s) Performed: Procedure(s) (LRB): ESOPHAGOGASTRODUODENOSCOPY (EGD) WITH PROPOFOL (N/A)  Patient location during evaluation: Endoscopy Anesthesia Type: General Level of consciousness: awake and alert Pain management: pain level controlled Vital Signs Assessment: post-procedure vital signs reviewed and stable Respiratory status: spontaneous breathing, nonlabored ventilation, respiratory function stable and patient connected to nasal cannula oxygen Cardiovascular status: blood pressure returned to baseline and stable Postop Assessment: no signs of nausea or vomiting Anesthetic complications: no    Last Vitals:  Filed Vitals:   05/17/15 1054 05/17/15 1104  BP: 96/66 96/37  Pulse: 73 72  Temp:    Resp: 15 16    Last Pain:  Filed Vitals:   05/17/15 1107  PainSc: Asleep                 Precious Haws Piscitello

## 2015-05-17 NOTE — Discharge Instructions (Signed)
Resume HD on your previous as scheduled. Notify your doctor if your symptoms return. Take all medications as prescribed.  Notify your doctor of any questions or concerns.

## 2015-05-17 NOTE — Op Note (Signed)
[facility] Gastroenterology Patient Name: Nico Arbaiza Procedure Date: 05/17/2015 9:34 AM MRN: [id number] Account #: [id number] Date of Birth: 09-26-1960 Admit Type: Inpatient Age: 55 Room: [facility] ENDO ROOM 4 Gender: Male Note Status: Finalized Procedure:            Upper GI endoscopy Indications:          Melena Providers:            [name], MD Medicines:            Propofol per Anesthesia Complications:        No immediate complications. Procedure:            Pre-Anesthesia Assessment:                       - Prior to the procedure, a History and Physical was                        performed, and patient medications and allergies were                        reviewed. The patient's tolerance of previous                        anesthesia was also reviewed. The risks and benefits of                        the procedure and the sedation options and risks were                        discussed with the patient. All questions were                        answered, and informed consent was obtained. Prior                        Anticoagulants: The patient has taken no previous                        anticoagulant or antiplatelet agents. ASA Grade                        Assessment: II - A patient with mild systemic disease.                        After reviewing the risks and benefits, the patient was                        deemed in satisfactory condition to undergo the                        procedure.                       After obtaining informed consent, the endoscope was                        passed under direct vision. Throughout the procedure,                        the patient's blood pressure, pulse, and oxygen  saturations were monitored continuously. The Endoscope                        was introduced through the mouth, and advanced to the                        second part of duodenum. The upper GI endoscopy was       accomplished without difficulty. The patient tolerated                        the procedure well. Findings:      LA Grade B (one or more mucosal breaks greater than 5 mm, not extending       between the tops of two mucosal folds) esophagitis with no bleeding was       found at the gastroesophageal junction.      Two 4 mm stigmata of recent bleeding angiodysplastic lesions were found       in the cardia. Coagulation for hemostasis using argon beam at 2       liters/minute and 25 watts was successful.      Localized mild inflammation characterized by erythema was found in the       gastric antrum.      The examined duodenum was normal. Impression:           - LA Grade B esophagitis.                       - Two recently bleeding angiodysplastic lesions in the                        stomach. Treated with argon beam coagulation.                       - Gastritis.                       - Normal examined duodenum.                       - No specimens collected. Recommendation:       - Return patient to hospital ward for ongoing care. Procedure Code(s):    --- Professional ---                       [phone], Esophagogastroduodenoscopy, flexible, transoral;                        with control of bleeding, any method Diagnosis Code(s):    --- Professional ---                       K92.1, Melena (includes Hematochezia)                       K31.811, Angiodysplasia of stomach and duodenum with                        bleeding                       K29.70, Gastritis, unspecified, without bleeding  K20.9, Esophagitis, unspecified CPT copyright 2016 [employer]. All rights reserved. The codes documented in this report are preliminary and upon coder review may  be revised to meet current compliance requirements. [name], MD 05/17/2015 10:36:22 AM This report has been signed electronically. Number of Addenda: 0 Note Initiated On: 05/17/2015 9:34 AM       [facility]

## 2015-05-18 ENCOUNTER — Encounter: Payer: Self-pay | Admitting: Gastroenterology

## 2015-05-19 DIAGNOSIS — N186 End stage renal disease: Secondary | ICD-10-CM | POA: Diagnosis not present

## 2015-05-19 DIAGNOSIS — D631 Anemia in chronic kidney disease: Secondary | ICD-10-CM | POA: Diagnosis not present

## 2015-05-19 DIAGNOSIS — Z992 Dependence on renal dialysis: Secondary | ICD-10-CM | POA: Diagnosis not present

## 2015-05-19 DIAGNOSIS — D509 Iron deficiency anemia, unspecified: Secondary | ICD-10-CM | POA: Diagnosis not present

## 2015-05-19 DIAGNOSIS — N2581 Secondary hyperparathyroidism of renal origin: Secondary | ICD-10-CM | POA: Diagnosis not present

## 2015-05-21 DIAGNOSIS — D509 Iron deficiency anemia, unspecified: Secondary | ICD-10-CM | POA: Diagnosis not present

## 2015-05-21 DIAGNOSIS — Z992 Dependence on renal dialysis: Secondary | ICD-10-CM | POA: Diagnosis not present

## 2015-05-21 DIAGNOSIS — N186 End stage renal disease: Secondary | ICD-10-CM | POA: Diagnosis not present

## 2015-05-21 DIAGNOSIS — D631 Anemia in chronic kidney disease: Secondary | ICD-10-CM | POA: Diagnosis not present

## 2015-05-21 DIAGNOSIS — N2581 Secondary hyperparathyroidism of renal origin: Secondary | ICD-10-CM | POA: Diagnosis not present

## 2015-05-23 DIAGNOSIS — N2581 Secondary hyperparathyroidism of renal origin: Secondary | ICD-10-CM | POA: Diagnosis not present

## 2015-05-23 DIAGNOSIS — N186 End stage renal disease: Secondary | ICD-10-CM | POA: Diagnosis not present

## 2015-05-23 DIAGNOSIS — D631 Anemia in chronic kidney disease: Secondary | ICD-10-CM | POA: Diagnosis not present

## 2015-05-23 DIAGNOSIS — Z992 Dependence on renal dialysis: Secondary | ICD-10-CM | POA: Diagnosis not present

## 2015-05-23 DIAGNOSIS — D509 Iron deficiency anemia, unspecified: Secondary | ICD-10-CM | POA: Diagnosis not present

## 2015-05-25 LAB — PH, BODY FLUID: pH, Body Fluid: 7.5

## 2015-05-26 DIAGNOSIS — N2581 Secondary hyperparathyroidism of renal origin: Secondary | ICD-10-CM | POA: Diagnosis not present

## 2015-05-26 DIAGNOSIS — D631 Anemia in chronic kidney disease: Secondary | ICD-10-CM | POA: Diagnosis not present

## 2015-05-26 DIAGNOSIS — D509 Iron deficiency anemia, unspecified: Secondary | ICD-10-CM | POA: Diagnosis not present

## 2015-05-26 DIAGNOSIS — N186 End stage renal disease: Secondary | ICD-10-CM | POA: Diagnosis not present

## 2015-05-26 DIAGNOSIS — Z992 Dependence on renal dialysis: Secondary | ICD-10-CM | POA: Diagnosis not present

## 2015-05-28 DIAGNOSIS — N186 End stage renal disease: Secondary | ICD-10-CM | POA: Diagnosis not present

## 2015-05-28 DIAGNOSIS — Z992 Dependence on renal dialysis: Secondary | ICD-10-CM | POA: Diagnosis not present

## 2015-05-28 DIAGNOSIS — D509 Iron deficiency anemia, unspecified: Secondary | ICD-10-CM | POA: Diagnosis not present

## 2015-05-28 DIAGNOSIS — D631 Anemia in chronic kidney disease: Secondary | ICD-10-CM | POA: Diagnosis not present

## 2015-05-28 DIAGNOSIS — N2581 Secondary hyperparathyroidism of renal origin: Secondary | ICD-10-CM | POA: Diagnosis not present

## 2015-05-28 IMAGING — PX DG ORTHOPANTOGRAM /PANORAMIC
2 series · 2 of 2 positions shown · non-contrast
Comparison: 06/03/2013

CLINICAL DATA: Mandibular fracture, follow-up

EXAM:
ORTHOPANTOGRAM/PANORAMIC

[Series 1: — · U · 1 of 1 slices shown (1 of 2)]
[im 1/1]
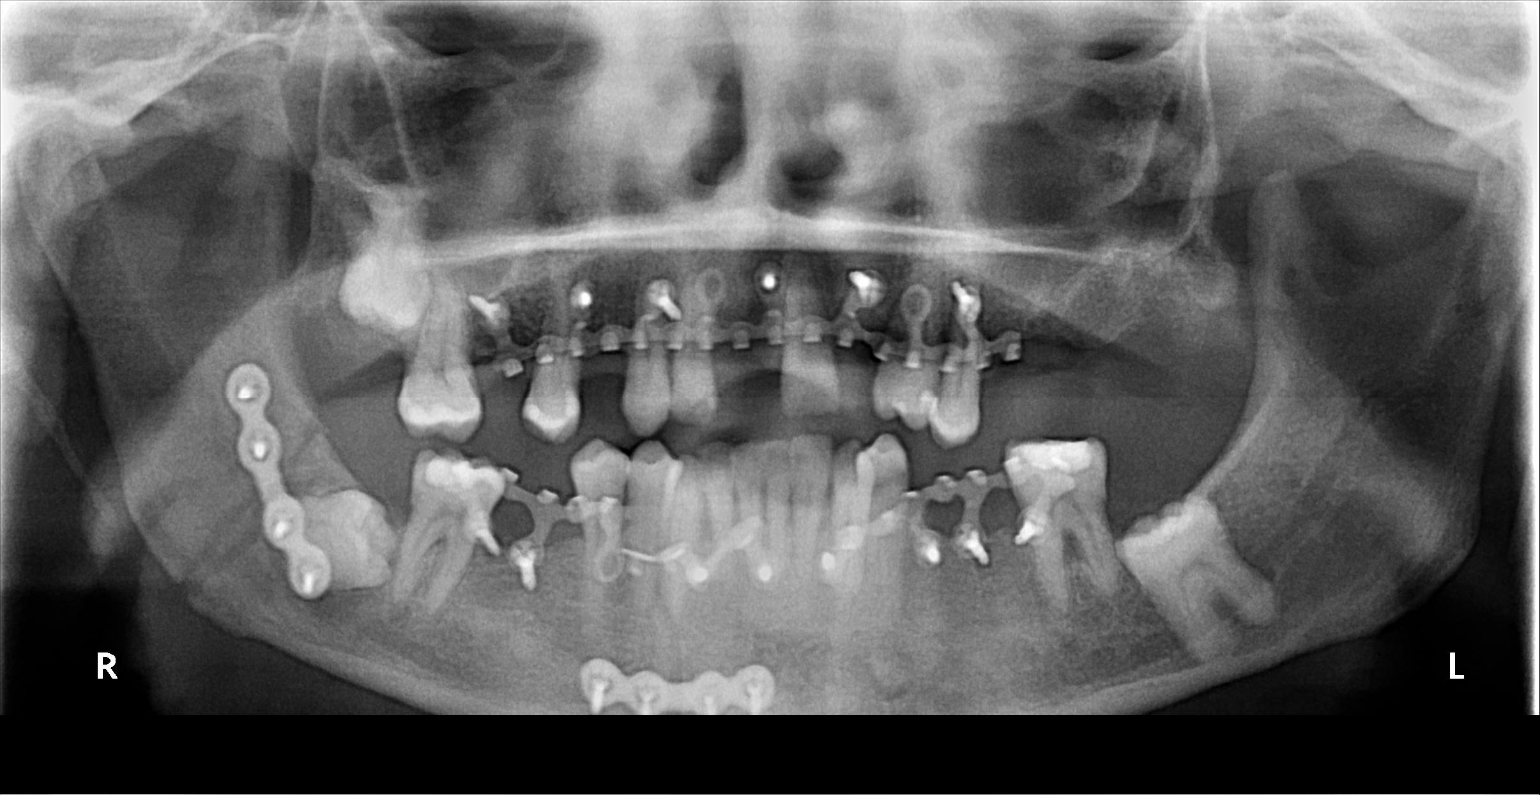

[Series 1: — · U · 1 of 1 slices shown (2 of 2)]
[im 1/1]
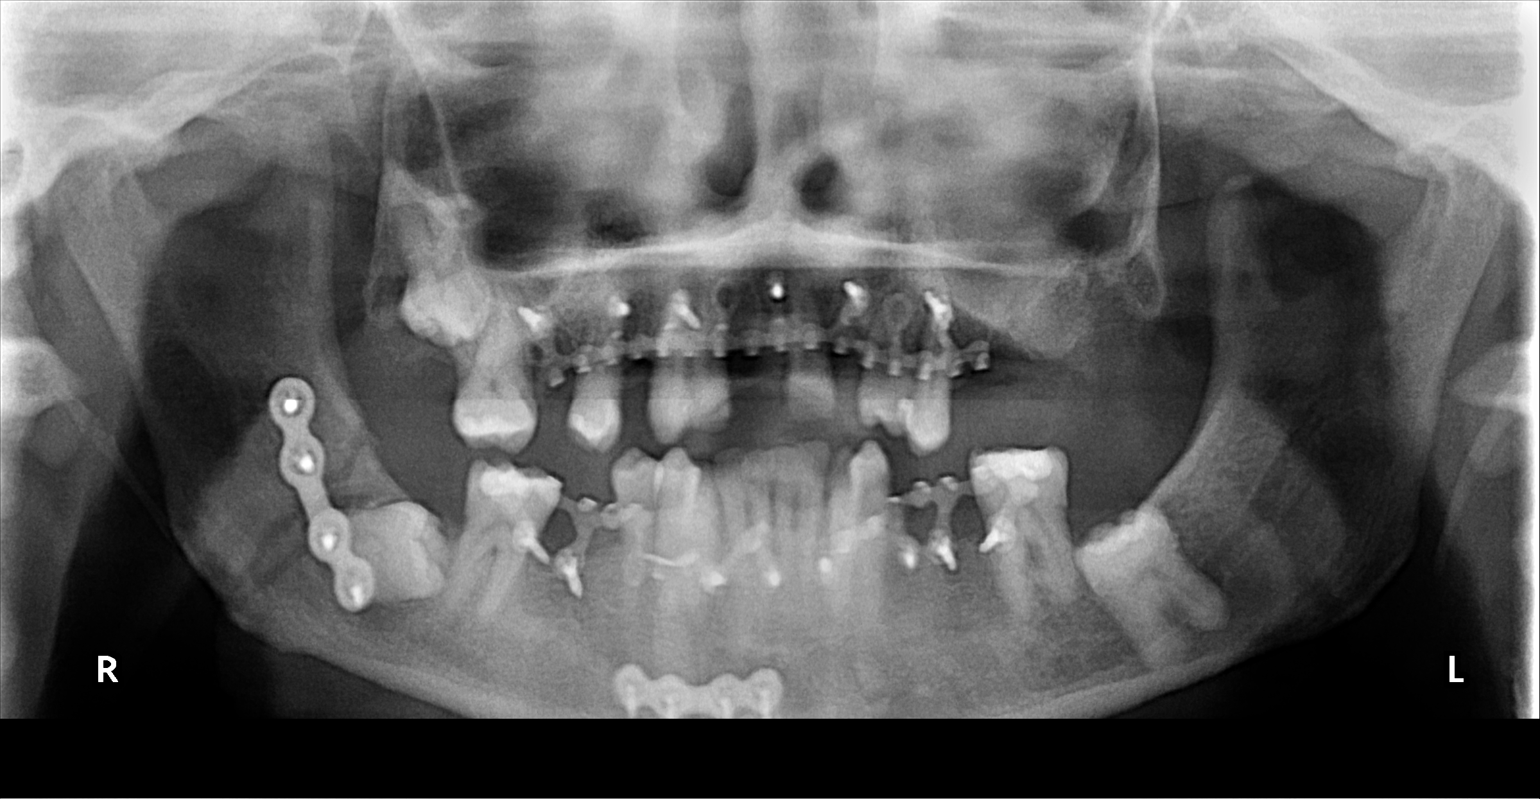

[2 of 2 positions shown; findings below may reference images not displayed]

FINDINGS: Plates and screws are seen at reduced fractures of the anterior
right mandible and the angle of the right mandible.

Osseous mineralization normal.

Additional plates and screws are seen at the anterior aspects of the
maxilla and mandible bilaterally.

No periodontal lucency identified.

Unerupted bilateral mandibular right maxillary wisdom teeth.
IMPRESSION: Post ORIF of right mandibular fractures as above.

## 2015-05-30 DIAGNOSIS — D509 Iron deficiency anemia, unspecified: Secondary | ICD-10-CM | POA: Diagnosis not present

## 2015-05-30 DIAGNOSIS — D631 Anemia in chronic kidney disease: Secondary | ICD-10-CM | POA: Diagnosis not present

## 2015-05-30 DIAGNOSIS — N186 End stage renal disease: Secondary | ICD-10-CM | POA: Diagnosis not present

## 2015-05-30 DIAGNOSIS — N2581 Secondary hyperparathyroidism of renal origin: Secondary | ICD-10-CM | POA: Diagnosis not present

## 2015-05-30 DIAGNOSIS — Z992 Dependence on renal dialysis: Secondary | ICD-10-CM | POA: Diagnosis not present

## 2015-06-02 DIAGNOSIS — N2581 Secondary hyperparathyroidism of renal origin: Secondary | ICD-10-CM | POA: Diagnosis not present

## 2015-06-02 DIAGNOSIS — D509 Iron deficiency anemia, unspecified: Secondary | ICD-10-CM | POA: Diagnosis not present

## 2015-06-02 DIAGNOSIS — N186 End stage renal disease: Secondary | ICD-10-CM | POA: Diagnosis not present

## 2015-06-02 DIAGNOSIS — Z992 Dependence on renal dialysis: Secondary | ICD-10-CM | POA: Diagnosis not present

## 2015-06-02 DIAGNOSIS — D631 Anemia in chronic kidney disease: Secondary | ICD-10-CM | POA: Diagnosis not present

## 2015-06-04 IMAGING — CT CT HEAD WITHOUT CONTRAST
1 series · 16 of 30 positions shown, 20 images · non-contrast
Comparison: CT scan of June 02, 2013.

CLINICAL DATA: Headache.

EXAM:
CT HEAD WITHOUT CONTRAST
TECHNIQUE: Contiguous axial images were obtained from the base of the skull
through the vertex without intravenous contrast.

[Series 2: head wo · axial · 0.43mm/px · z∈[-4,+131]mm · 16 of 30 slices shown, 20 images]
[im 2/30  brain]
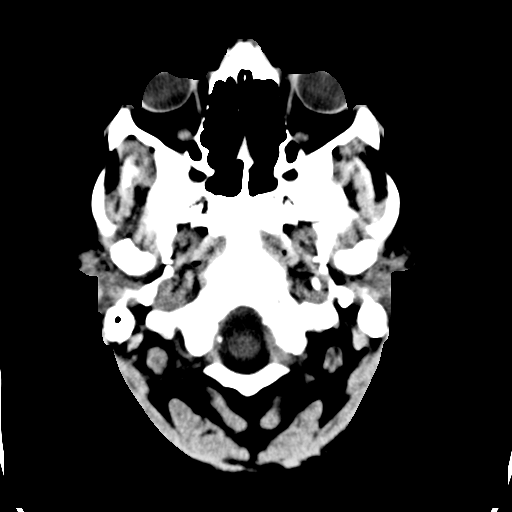
[im 2/30  bone]
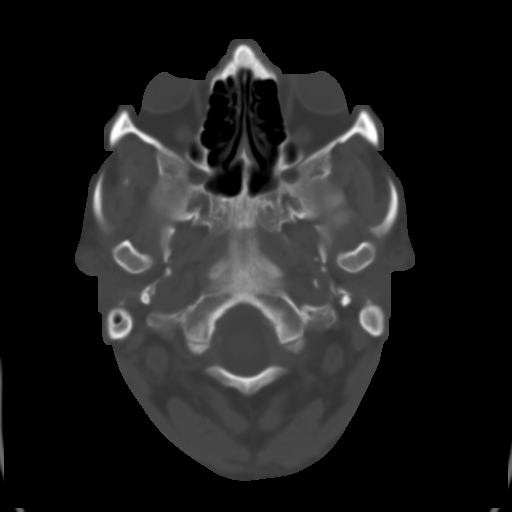
[im 4/30  brain]
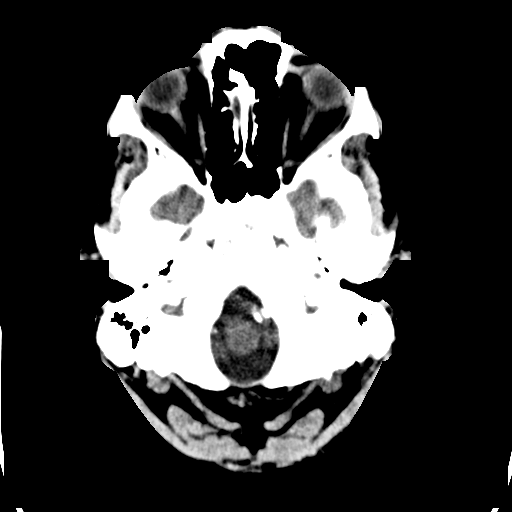
[im 6/30  brain]
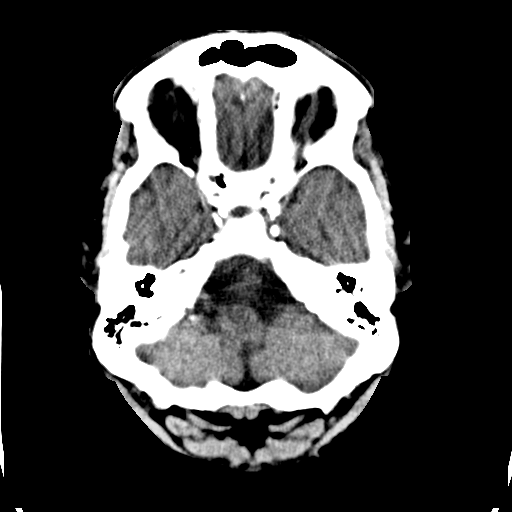
[im 8/30  brain]
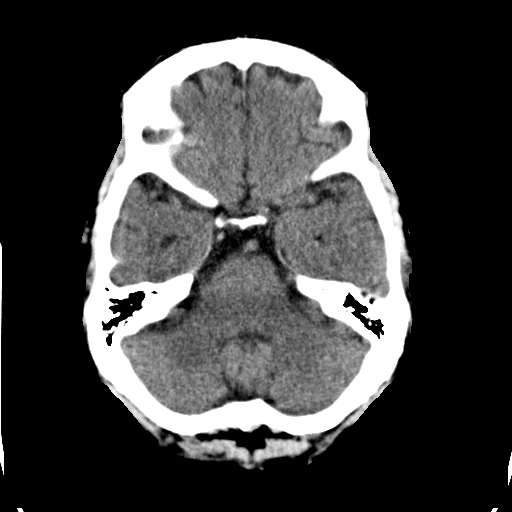
[im 9/30  brain]
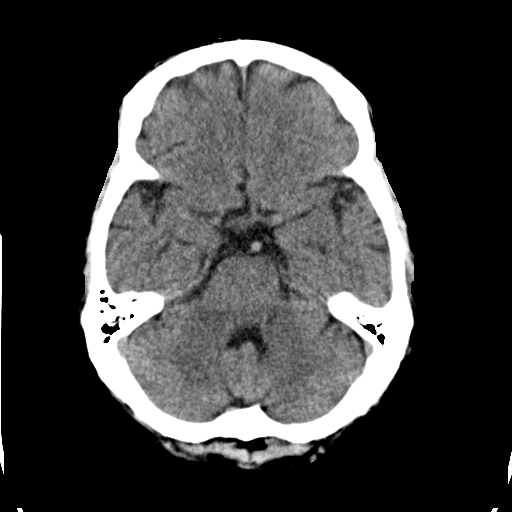
[im 9/30  bone]
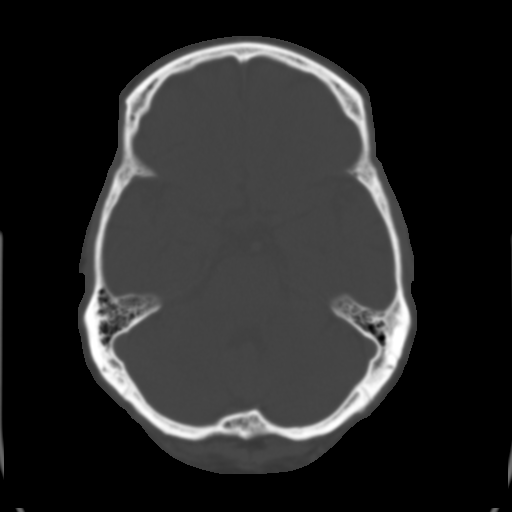
[im 11/30  brain]
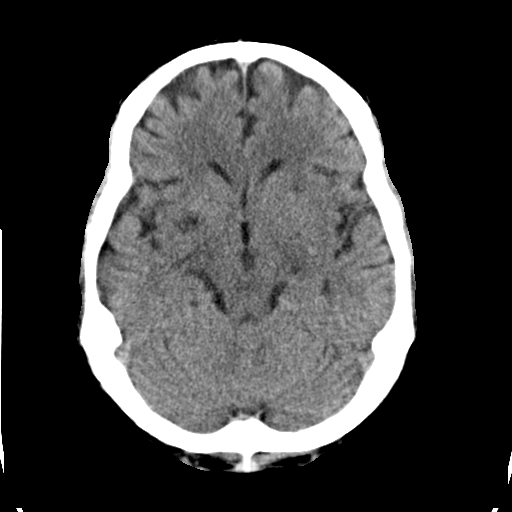
[im 13/30  brain]
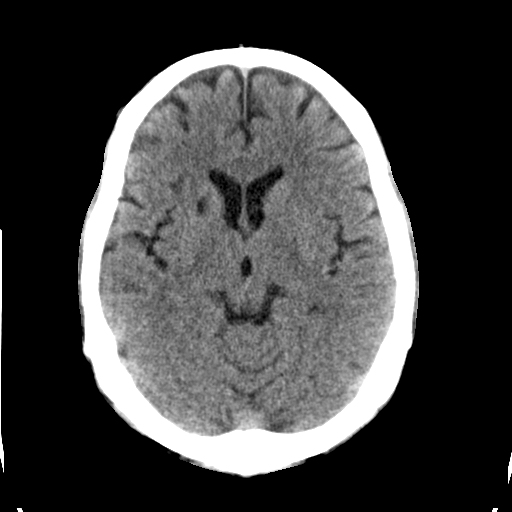
[im 15/30  brain]
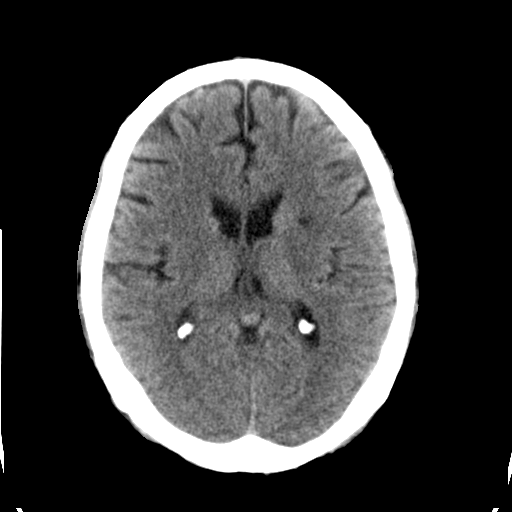
[im 16/30  brain]
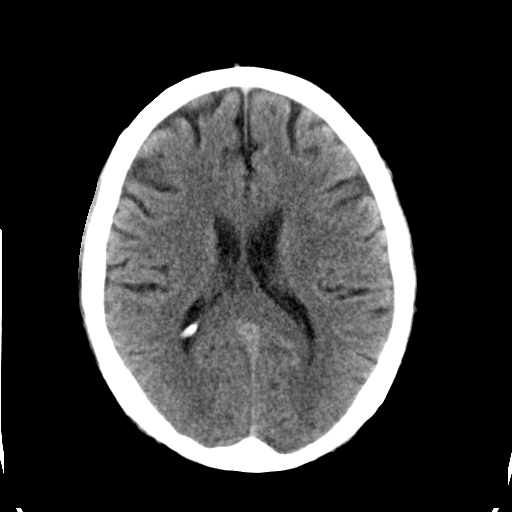
[im 16/30  bone]
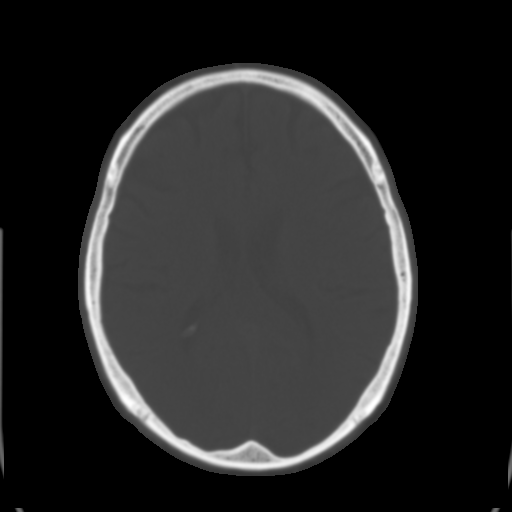
[im 18/30  brain]
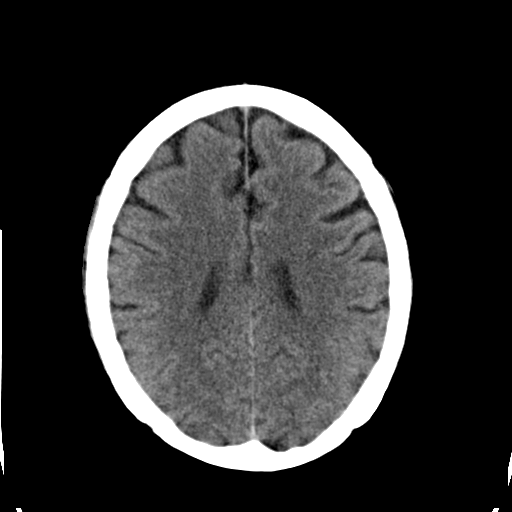
[im 20/30  brain]
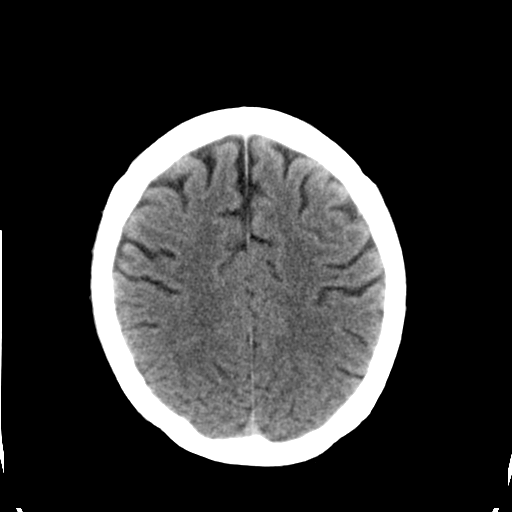
[im 22/30  brain]
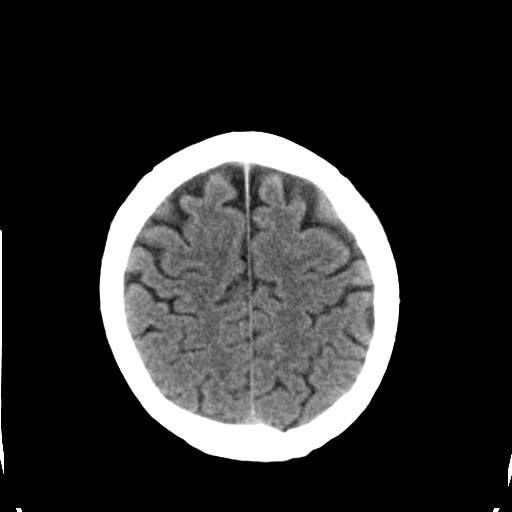
[im 23/30  brain]
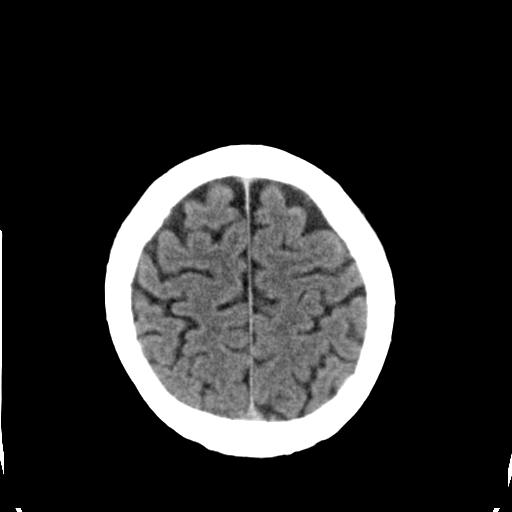
[im 23/30  bone]
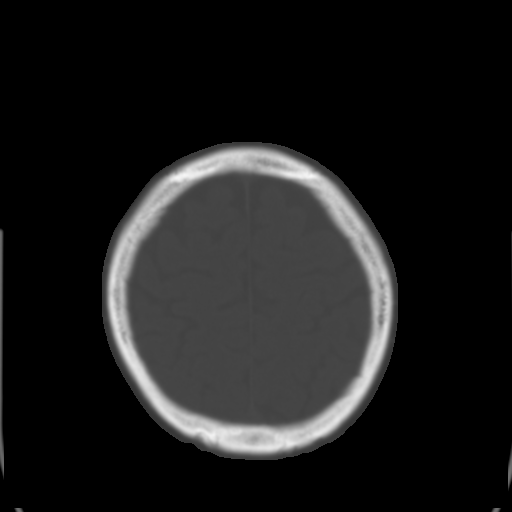
[im 25/30  brain]
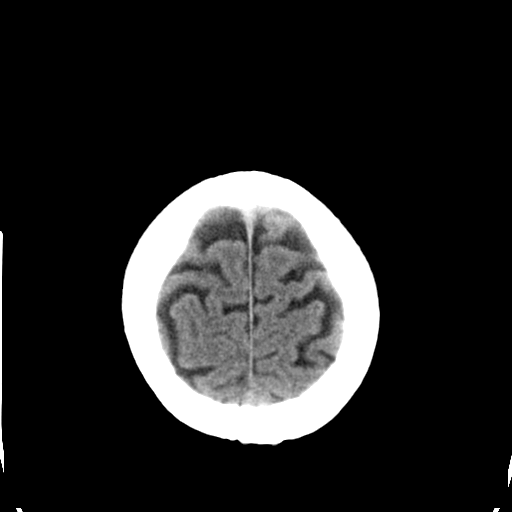
[im 27/30  brain]
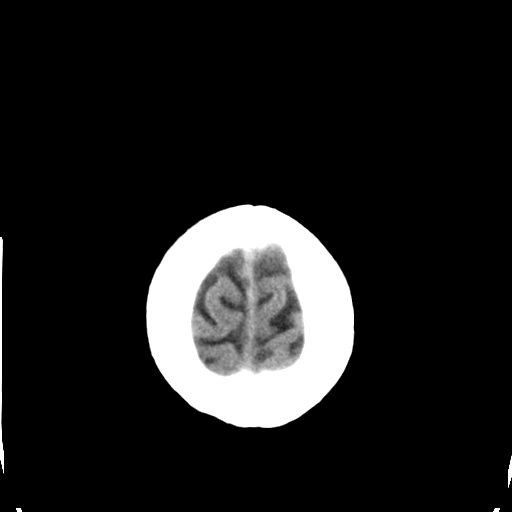
[im 29/30  brain]
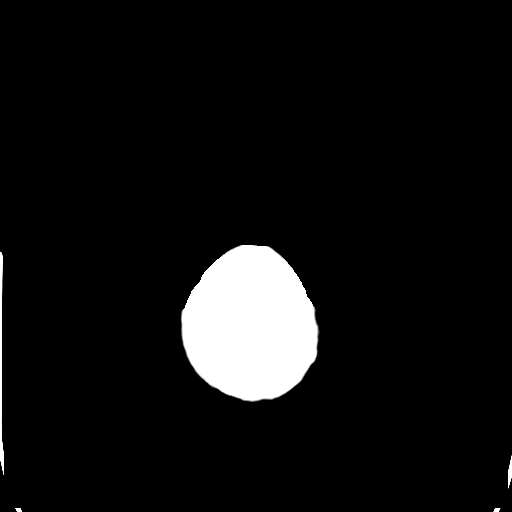

[16 of 30 positions shown; findings below may reference images not displayed]

FINDINGS: Bony calvarium appears intact. Mild diffuse cortical atrophy is
noted. Multiple old lacunar infarctions are noted in the basal
ganglia and periventricular white matter bilaterally. No mass effect
or midline shift is noted. Ventricular size is within normal limits.
There is no evidence of mass lesion, hemorrhage or acute infarction.
IMPRESSION: Mild diffuse cortical atrophy. Old lacunar infarctions are noted
bilaterally in the basal ganglia and periventricular white matter.
No acute intracranial abnormality seen.

## 2015-06-06 DIAGNOSIS — Z992 Dependence on renal dialysis: Secondary | ICD-10-CM | POA: Diagnosis not present

## 2015-06-06 DIAGNOSIS — N2581 Secondary hyperparathyroidism of renal origin: Secondary | ICD-10-CM | POA: Diagnosis not present

## 2015-06-06 DIAGNOSIS — D509 Iron deficiency anemia, unspecified: Secondary | ICD-10-CM | POA: Diagnosis not present

## 2015-06-06 DIAGNOSIS — N186 End stage renal disease: Secondary | ICD-10-CM | POA: Diagnosis not present

## 2015-06-06 DIAGNOSIS — D631 Anemia in chronic kidney disease: Secondary | ICD-10-CM | POA: Diagnosis not present

## 2015-06-09 DIAGNOSIS — N186 End stage renal disease: Secondary | ICD-10-CM | POA: Diagnosis not present

## 2015-06-09 DIAGNOSIS — N2581 Secondary hyperparathyroidism of renal origin: Secondary | ICD-10-CM | POA: Diagnosis not present

## 2015-06-09 DIAGNOSIS — Z992 Dependence on renal dialysis: Secondary | ICD-10-CM | POA: Diagnosis not present

## 2015-06-09 DIAGNOSIS — D631 Anemia in chronic kidney disease: Secondary | ICD-10-CM | POA: Diagnosis not present

## 2015-06-09 DIAGNOSIS — D509 Iron deficiency anemia, unspecified: Secondary | ICD-10-CM | POA: Diagnosis not present

## 2015-06-11 DIAGNOSIS — D509 Iron deficiency anemia, unspecified: Secondary | ICD-10-CM | POA: Diagnosis not present

## 2015-06-11 DIAGNOSIS — N186 End stage renal disease: Secondary | ICD-10-CM | POA: Diagnosis not present

## 2015-06-11 DIAGNOSIS — Z992 Dependence on renal dialysis: Secondary | ICD-10-CM | POA: Diagnosis not present

## 2015-06-11 DIAGNOSIS — D631 Anemia in chronic kidney disease: Secondary | ICD-10-CM | POA: Diagnosis not present

## 2015-06-11 DIAGNOSIS — N2581 Secondary hyperparathyroidism of renal origin: Secondary | ICD-10-CM | POA: Diagnosis not present

## 2015-06-13 DIAGNOSIS — N186 End stage renal disease: Secondary | ICD-10-CM | POA: Diagnosis not present

## 2015-06-13 DIAGNOSIS — N2581 Secondary hyperparathyroidism of renal origin: Secondary | ICD-10-CM | POA: Diagnosis not present

## 2015-06-13 DIAGNOSIS — D631 Anemia in chronic kidney disease: Secondary | ICD-10-CM | POA: Diagnosis not present

## 2015-06-13 DIAGNOSIS — Z992 Dependence on renal dialysis: Secondary | ICD-10-CM | POA: Diagnosis not present

## 2015-06-13 DIAGNOSIS — D509 Iron deficiency anemia, unspecified: Secondary | ICD-10-CM | POA: Diagnosis not present

## 2015-06-15 DIAGNOSIS — Z8719 Personal history of other diseases of the digestive system: Secondary | ICD-10-CM | POA: Diagnosis not present

## 2015-06-15 DIAGNOSIS — I10 Essential (primary) hypertension: Secondary | ICD-10-CM | POA: Diagnosis not present

## 2015-06-15 DIAGNOSIS — Z1389 Encounter for screening for other disorder: Secondary | ICD-10-CM | POA: Diagnosis not present

## 2015-06-15 DIAGNOSIS — K7031 Alcoholic cirrhosis of liver with ascites: Secondary | ICD-10-CM | POA: Diagnosis not present

## 2015-06-15 DIAGNOSIS — Z1159 Encounter for screening for other viral diseases: Secondary | ICD-10-CM | POA: Diagnosis not present

## 2015-06-16 DIAGNOSIS — N2581 Secondary hyperparathyroidism of renal origin: Secondary | ICD-10-CM | POA: Diagnosis not present

## 2015-06-16 DIAGNOSIS — D631 Anemia in chronic kidney disease: Secondary | ICD-10-CM | POA: Diagnosis not present

## 2015-06-16 DIAGNOSIS — Z992 Dependence on renal dialysis: Secondary | ICD-10-CM | POA: Diagnosis not present

## 2015-06-16 DIAGNOSIS — D509 Iron deficiency anemia, unspecified: Secondary | ICD-10-CM | POA: Diagnosis not present

## 2015-06-16 DIAGNOSIS — N186 End stage renal disease: Secondary | ICD-10-CM | POA: Diagnosis not present

## 2015-06-18 ENCOUNTER — Other Ambulatory Visit: Payer: Self-pay | Admitting: Nephrology

## 2015-06-18 DIAGNOSIS — N186 End stage renal disease: Secondary | ICD-10-CM | POA: Diagnosis not present

## 2015-06-18 DIAGNOSIS — D509 Iron deficiency anemia, unspecified: Secondary | ICD-10-CM | POA: Diagnosis not present

## 2015-06-18 DIAGNOSIS — D631 Anemia in chronic kidney disease: Secondary | ICD-10-CM | POA: Diagnosis not present

## 2015-06-18 DIAGNOSIS — R188 Other ascites: Secondary | ICD-10-CM

## 2015-06-18 DIAGNOSIS — N2581 Secondary hyperparathyroidism of renal origin: Secondary | ICD-10-CM | POA: Diagnosis not present

## 2015-06-18 DIAGNOSIS — Z992 Dependence on renal dialysis: Secondary | ICD-10-CM | POA: Diagnosis not present

## 2015-06-22 ENCOUNTER — Ambulatory Visit
Admission: RE | Admit: 2015-06-22 | Discharge: 2015-06-22 | Disposition: A | Discharge status: Home / Self Care | Payer: Medicare Other | Source: Ambulatory Visit | Attending: Nephrology | Admitting: Nephrology

## 2015-06-22 DIAGNOSIS — R188 Other ascites: Secondary | ICD-10-CM | POA: Diagnosis not present

## 2015-06-22 NOTE — Procedures (Signed)
Reviewed H+P of 04/30/2015 and assessed patient, with no changes noted. Discussed procedure and risks with patient and informed consent was obtained. Will perform US-guided paracentesis.

## 2015-06-23 DIAGNOSIS — D509 Iron deficiency anemia, unspecified: Secondary | ICD-10-CM | POA: Diagnosis not present

## 2015-06-23 DIAGNOSIS — N2581 Secondary hyperparathyroidism of renal origin: Secondary | ICD-10-CM | POA: Diagnosis not present

## 2015-06-23 DIAGNOSIS — N186 End stage renal disease: Secondary | ICD-10-CM | POA: Diagnosis not present

## 2015-06-23 DIAGNOSIS — D631 Anemia in chronic kidney disease: Secondary | ICD-10-CM | POA: Diagnosis not present

## 2015-06-23 DIAGNOSIS — Z992 Dependence on renal dialysis: Secondary | ICD-10-CM | POA: Diagnosis not present

## 2015-06-25 DIAGNOSIS — N186 End stage renal disease: Secondary | ICD-10-CM | POA: Diagnosis not present

## 2015-06-25 DIAGNOSIS — Z992 Dependence on renal dialysis: Secondary | ICD-10-CM | POA: Diagnosis not present

## 2015-06-25 DIAGNOSIS — D509 Iron deficiency anemia, unspecified: Secondary | ICD-10-CM | POA: Diagnosis not present

## 2015-06-25 DIAGNOSIS — N2581 Secondary hyperparathyroidism of renal origin: Secondary | ICD-10-CM | POA: Diagnosis not present

## 2015-06-25 DIAGNOSIS — D631 Anemia in chronic kidney disease: Secondary | ICD-10-CM | POA: Diagnosis not present

## 2015-06-30 DIAGNOSIS — N186 End stage renal disease: Secondary | ICD-10-CM | POA: Diagnosis not present

## 2015-06-30 DIAGNOSIS — Z992 Dependence on renal dialysis: Secondary | ICD-10-CM | POA: Diagnosis not present

## 2015-06-30 DIAGNOSIS — D509 Iron deficiency anemia, unspecified: Secondary | ICD-10-CM | POA: Diagnosis not present

## 2015-06-30 DIAGNOSIS — N2581 Secondary hyperparathyroidism of renal origin: Secondary | ICD-10-CM | POA: Diagnosis not present

## 2015-06-30 DIAGNOSIS — D631 Anemia in chronic kidney disease: Secondary | ICD-10-CM | POA: Diagnosis not present

## 2015-07-02 DIAGNOSIS — Z992 Dependence on renal dialysis: Secondary | ICD-10-CM | POA: Diagnosis not present

## 2015-07-02 DIAGNOSIS — D631 Anemia in chronic kidney disease: Secondary | ICD-10-CM | POA: Diagnosis not present

## 2015-07-02 DIAGNOSIS — N2581 Secondary hyperparathyroidism of renal origin: Secondary | ICD-10-CM | POA: Diagnosis not present

## 2015-07-02 DIAGNOSIS — D509 Iron deficiency anemia, unspecified: Secondary | ICD-10-CM | POA: Diagnosis not present

## 2015-07-02 DIAGNOSIS — N186 End stage renal disease: Secondary | ICD-10-CM | POA: Diagnosis not present

## 2015-07-04 DIAGNOSIS — D509 Iron deficiency anemia, unspecified: Secondary | ICD-10-CM | POA: Diagnosis not present

## 2015-07-04 DIAGNOSIS — Z992 Dependence on renal dialysis: Secondary | ICD-10-CM | POA: Diagnosis not present

## 2015-07-04 DIAGNOSIS — N186 End stage renal disease: Secondary | ICD-10-CM | POA: Diagnosis not present

## 2015-07-04 DIAGNOSIS — D631 Anemia in chronic kidney disease: Secondary | ICD-10-CM | POA: Diagnosis not present

## 2015-07-04 DIAGNOSIS — N2581 Secondary hyperparathyroidism of renal origin: Secondary | ICD-10-CM | POA: Diagnosis not present

## 2015-07-07 DIAGNOSIS — N186 End stage renal disease: Secondary | ICD-10-CM | POA: Diagnosis not present

## 2015-07-07 DIAGNOSIS — N2581 Secondary hyperparathyroidism of renal origin: Secondary | ICD-10-CM | POA: Diagnosis not present

## 2015-07-07 DIAGNOSIS — D509 Iron deficiency anemia, unspecified: Secondary | ICD-10-CM | POA: Diagnosis not present

## 2015-07-07 DIAGNOSIS — Z992 Dependence on renal dialysis: Secondary | ICD-10-CM | POA: Diagnosis not present

## 2015-07-07 DIAGNOSIS — D631 Anemia in chronic kidney disease: Secondary | ICD-10-CM | POA: Diagnosis not present

## 2015-07-09 DIAGNOSIS — Z992 Dependence on renal dialysis: Secondary | ICD-10-CM | POA: Diagnosis not present

## 2015-07-09 DIAGNOSIS — D631 Anemia in chronic kidney disease: Secondary | ICD-10-CM | POA: Diagnosis not present

## 2015-07-09 DIAGNOSIS — N2581 Secondary hyperparathyroidism of renal origin: Secondary | ICD-10-CM | POA: Diagnosis not present

## 2015-07-09 DIAGNOSIS — N186 End stage renal disease: Secondary | ICD-10-CM | POA: Diagnosis not present

## 2015-07-09 DIAGNOSIS — D509 Iron deficiency anemia, unspecified: Secondary | ICD-10-CM | POA: Diagnosis not present

## 2015-07-11 DIAGNOSIS — N2581 Secondary hyperparathyroidism of renal origin: Secondary | ICD-10-CM | POA: Diagnosis not present

## 2015-07-11 DIAGNOSIS — D631 Anemia in chronic kidney disease: Secondary | ICD-10-CM | POA: Diagnosis not present

## 2015-07-11 DIAGNOSIS — N186 End stage renal disease: Secondary | ICD-10-CM | POA: Diagnosis not present

## 2015-07-11 DIAGNOSIS — D509 Iron deficiency anemia, unspecified: Secondary | ICD-10-CM | POA: Diagnosis not present

## 2015-07-11 DIAGNOSIS — Z992 Dependence on renal dialysis: Secondary | ICD-10-CM | POA: Diagnosis not present

## 2015-07-12 DIAGNOSIS — N186 End stage renal disease: Secondary | ICD-10-CM | POA: Diagnosis not present

## 2015-07-12 DIAGNOSIS — Z992 Dependence on renal dialysis: Secondary | ICD-10-CM | POA: Diagnosis not present

## 2015-07-14 DIAGNOSIS — D509 Iron deficiency anemia, unspecified: Secondary | ICD-10-CM | POA: Diagnosis not present

## 2015-07-14 DIAGNOSIS — D631 Anemia in chronic kidney disease: Secondary | ICD-10-CM | POA: Diagnosis not present

## 2015-07-14 DIAGNOSIS — N2581 Secondary hyperparathyroidism of renal origin: Secondary | ICD-10-CM | POA: Diagnosis not present

## 2015-07-14 DIAGNOSIS — Z992 Dependence on renal dialysis: Secondary | ICD-10-CM | POA: Diagnosis not present

## 2015-07-14 DIAGNOSIS — N186 End stage renal disease: Secondary | ICD-10-CM | POA: Diagnosis not present

## 2015-07-16 DIAGNOSIS — N2581 Secondary hyperparathyroidism of renal origin: Secondary | ICD-10-CM | POA: Diagnosis not present

## 2015-07-16 DIAGNOSIS — N186 End stage renal disease: Secondary | ICD-10-CM | POA: Diagnosis not present

## 2015-07-16 DIAGNOSIS — D509 Iron deficiency anemia, unspecified: Secondary | ICD-10-CM | POA: Diagnosis not present

## 2015-07-16 DIAGNOSIS — Z992 Dependence on renal dialysis: Secondary | ICD-10-CM | POA: Diagnosis not present

## 2015-07-16 DIAGNOSIS — D631 Anemia in chronic kidney disease: Secondary | ICD-10-CM | POA: Diagnosis not present

## 2015-07-18 DIAGNOSIS — N2581 Secondary hyperparathyroidism of renal origin: Secondary | ICD-10-CM | POA: Diagnosis not present

## 2015-07-18 DIAGNOSIS — Z992 Dependence on renal dialysis: Secondary | ICD-10-CM | POA: Diagnosis not present

## 2015-07-18 DIAGNOSIS — N186 End stage renal disease: Secondary | ICD-10-CM | POA: Diagnosis not present

## 2015-07-18 DIAGNOSIS — D631 Anemia in chronic kidney disease: Secondary | ICD-10-CM | POA: Diagnosis not present

## 2015-07-18 DIAGNOSIS — D509 Iron deficiency anemia, unspecified: Secondary | ICD-10-CM | POA: Diagnosis not present

## 2015-07-21 DIAGNOSIS — D509 Iron deficiency anemia, unspecified: Secondary | ICD-10-CM | POA: Diagnosis not present

## 2015-07-21 DIAGNOSIS — N2581 Secondary hyperparathyroidism of renal origin: Secondary | ICD-10-CM | POA: Diagnosis not present

## 2015-07-21 DIAGNOSIS — Z992 Dependence on renal dialysis: Secondary | ICD-10-CM | POA: Diagnosis not present

## 2015-07-21 DIAGNOSIS — N186 End stage renal disease: Secondary | ICD-10-CM | POA: Diagnosis not present

## 2015-07-21 DIAGNOSIS — D631 Anemia in chronic kidney disease: Secondary | ICD-10-CM | POA: Diagnosis not present

## 2015-07-23 DIAGNOSIS — N2581 Secondary hyperparathyroidism of renal origin: Secondary | ICD-10-CM | POA: Diagnosis not present

## 2015-07-23 DIAGNOSIS — D509 Iron deficiency anemia, unspecified: Secondary | ICD-10-CM | POA: Diagnosis not present

## 2015-07-23 DIAGNOSIS — N186 End stage renal disease: Secondary | ICD-10-CM | POA: Diagnosis not present

## 2015-07-23 DIAGNOSIS — Z992 Dependence on renal dialysis: Secondary | ICD-10-CM | POA: Diagnosis not present

## 2015-07-23 DIAGNOSIS — D631 Anemia in chronic kidney disease: Secondary | ICD-10-CM | POA: Diagnosis not present

## 2015-07-25 DIAGNOSIS — N2581 Secondary hyperparathyroidism of renal origin: Secondary | ICD-10-CM | POA: Diagnosis not present

## 2015-07-25 DIAGNOSIS — D509 Iron deficiency anemia, unspecified: Secondary | ICD-10-CM | POA: Diagnosis not present

## 2015-07-25 DIAGNOSIS — N186 End stage renal disease: Secondary | ICD-10-CM | POA: Diagnosis not present

## 2015-07-25 DIAGNOSIS — Z992 Dependence on renal dialysis: Secondary | ICD-10-CM | POA: Diagnosis not present

## 2015-07-25 DIAGNOSIS — D631 Anemia in chronic kidney disease: Secondary | ICD-10-CM | POA: Diagnosis not present

## 2015-07-28 DIAGNOSIS — D509 Iron deficiency anemia, unspecified: Secondary | ICD-10-CM | POA: Diagnosis not present

## 2015-07-28 DIAGNOSIS — Z992 Dependence on renal dialysis: Secondary | ICD-10-CM | POA: Diagnosis not present

## 2015-07-28 DIAGNOSIS — N186 End stage renal disease: Secondary | ICD-10-CM | POA: Diagnosis not present

## 2015-07-28 DIAGNOSIS — D631 Anemia in chronic kidney disease: Secondary | ICD-10-CM | POA: Diagnosis not present

## 2015-07-28 DIAGNOSIS — N2581 Secondary hyperparathyroidism of renal origin: Secondary | ICD-10-CM | POA: Diagnosis not present

## 2015-07-30 DIAGNOSIS — D631 Anemia in chronic kidney disease: Secondary | ICD-10-CM | POA: Diagnosis not present

## 2015-07-30 DIAGNOSIS — D509 Iron deficiency anemia, unspecified: Secondary | ICD-10-CM | POA: Diagnosis not present

## 2015-07-30 DIAGNOSIS — Z992 Dependence on renal dialysis: Secondary | ICD-10-CM | POA: Diagnosis not present

## 2015-07-30 DIAGNOSIS — N186 End stage renal disease: Secondary | ICD-10-CM | POA: Diagnosis not present

## 2015-07-30 DIAGNOSIS — N2581 Secondary hyperparathyroidism of renal origin: Secondary | ICD-10-CM | POA: Diagnosis not present

## 2015-08-01 DIAGNOSIS — N2581 Secondary hyperparathyroidism of renal origin: Secondary | ICD-10-CM | POA: Diagnosis not present

## 2015-08-01 DIAGNOSIS — D509 Iron deficiency anemia, unspecified: Secondary | ICD-10-CM | POA: Diagnosis not present

## 2015-08-01 DIAGNOSIS — Z992 Dependence on renal dialysis: Secondary | ICD-10-CM | POA: Diagnosis not present

## 2015-08-01 DIAGNOSIS — N186 End stage renal disease: Secondary | ICD-10-CM | POA: Diagnosis not present

## 2015-08-01 DIAGNOSIS — D631 Anemia in chronic kidney disease: Secondary | ICD-10-CM | POA: Diagnosis not present

## 2015-08-04 DIAGNOSIS — N2581 Secondary hyperparathyroidism of renal origin: Secondary | ICD-10-CM | POA: Diagnosis not present

## 2015-08-04 DIAGNOSIS — Z992 Dependence on renal dialysis: Secondary | ICD-10-CM | POA: Diagnosis not present

## 2015-08-04 DIAGNOSIS — D631 Anemia in chronic kidney disease: Secondary | ICD-10-CM | POA: Diagnosis not present

## 2015-08-04 DIAGNOSIS — D509 Iron deficiency anemia, unspecified: Secondary | ICD-10-CM | POA: Diagnosis not present

## 2015-08-04 DIAGNOSIS — N186 End stage renal disease: Secondary | ICD-10-CM | POA: Diagnosis not present

## 2015-08-06 DIAGNOSIS — D631 Anemia in chronic kidney disease: Secondary | ICD-10-CM | POA: Diagnosis not present

## 2015-08-06 DIAGNOSIS — N186 End stage renal disease: Secondary | ICD-10-CM | POA: Diagnosis not present

## 2015-08-06 DIAGNOSIS — N2581 Secondary hyperparathyroidism of renal origin: Secondary | ICD-10-CM | POA: Diagnosis not present

## 2015-08-06 DIAGNOSIS — Z992 Dependence on renal dialysis: Secondary | ICD-10-CM | POA: Diagnosis not present

## 2015-08-06 DIAGNOSIS — D509 Iron deficiency anemia, unspecified: Secondary | ICD-10-CM | POA: Diagnosis not present

## 2015-08-08 DIAGNOSIS — N186 End stage renal disease: Secondary | ICD-10-CM | POA: Diagnosis not present

## 2015-08-08 DIAGNOSIS — N2581 Secondary hyperparathyroidism of renal origin: Secondary | ICD-10-CM | POA: Diagnosis not present

## 2015-08-08 DIAGNOSIS — Z992 Dependence on renal dialysis: Secondary | ICD-10-CM | POA: Diagnosis not present

## 2015-08-08 DIAGNOSIS — D509 Iron deficiency anemia, unspecified: Secondary | ICD-10-CM | POA: Diagnosis not present

## 2015-08-08 DIAGNOSIS — D631 Anemia in chronic kidney disease: Secondary | ICD-10-CM | POA: Diagnosis not present

## 2015-08-11 DIAGNOSIS — Z992 Dependence on renal dialysis: Secondary | ICD-10-CM | POA: Diagnosis not present

## 2015-08-11 DIAGNOSIS — N2581 Secondary hyperparathyroidism of renal origin: Secondary | ICD-10-CM | POA: Diagnosis not present

## 2015-08-11 DIAGNOSIS — D631 Anemia in chronic kidney disease: Secondary | ICD-10-CM | POA: Diagnosis not present

## 2015-08-11 DIAGNOSIS — N186 End stage renal disease: Secondary | ICD-10-CM | POA: Diagnosis not present

## 2015-08-11 DIAGNOSIS — D509 Iron deficiency anemia, unspecified: Secondary | ICD-10-CM | POA: Diagnosis not present

## 2015-08-12 DIAGNOSIS — N186 End stage renal disease: Secondary | ICD-10-CM | POA: Diagnosis not present

## 2015-08-12 DIAGNOSIS — Z992 Dependence on renal dialysis: Secondary | ICD-10-CM | POA: Diagnosis not present

## 2015-08-13 DIAGNOSIS — Z992 Dependence on renal dialysis: Secondary | ICD-10-CM | POA: Diagnosis not present

## 2015-08-13 DIAGNOSIS — N186 End stage renal disease: Secondary | ICD-10-CM | POA: Diagnosis not present

## 2015-08-13 DIAGNOSIS — D509 Iron deficiency anemia, unspecified: Secondary | ICD-10-CM | POA: Diagnosis not present

## 2015-08-15 DIAGNOSIS — D509 Iron deficiency anemia, unspecified: Secondary | ICD-10-CM | POA: Diagnosis not present

## 2015-08-15 DIAGNOSIS — Z992 Dependence on renal dialysis: Secondary | ICD-10-CM | POA: Diagnosis not present

## 2015-08-15 DIAGNOSIS — N186 End stage renal disease: Secondary | ICD-10-CM | POA: Diagnosis not present

## 2015-08-18 DIAGNOSIS — Z992 Dependence on renal dialysis: Secondary | ICD-10-CM | POA: Diagnosis not present

## 2015-08-18 DIAGNOSIS — N186 End stage renal disease: Secondary | ICD-10-CM | POA: Diagnosis not present

## 2015-08-18 DIAGNOSIS — D509 Iron deficiency anemia, unspecified: Secondary | ICD-10-CM | POA: Diagnosis not present

## 2015-08-20 DIAGNOSIS — D509 Iron deficiency anemia, unspecified: Secondary | ICD-10-CM | POA: Diagnosis not present

## 2015-08-20 DIAGNOSIS — N186 End stage renal disease: Secondary | ICD-10-CM | POA: Diagnosis not present

## 2015-08-20 DIAGNOSIS — Z992 Dependence on renal dialysis: Secondary | ICD-10-CM | POA: Diagnosis not present

## 2015-08-22 DIAGNOSIS — N186 End stage renal disease: Secondary | ICD-10-CM | POA: Diagnosis not present

## 2015-08-22 DIAGNOSIS — Z992 Dependence on renal dialysis: Secondary | ICD-10-CM | POA: Diagnosis not present

## 2015-08-22 DIAGNOSIS — D509 Iron deficiency anemia, unspecified: Secondary | ICD-10-CM | POA: Diagnosis not present

## 2015-08-25 DIAGNOSIS — Z992 Dependence on renal dialysis: Secondary | ICD-10-CM | POA: Diagnosis not present

## 2015-08-25 DIAGNOSIS — D509 Iron deficiency anemia, unspecified: Secondary | ICD-10-CM | POA: Diagnosis not present

## 2015-08-25 DIAGNOSIS — N186 End stage renal disease: Secondary | ICD-10-CM | POA: Diagnosis not present

## 2015-08-27 DIAGNOSIS — N186 End stage renal disease: Secondary | ICD-10-CM | POA: Diagnosis not present

## 2015-08-27 DIAGNOSIS — D509 Iron deficiency anemia, unspecified: Secondary | ICD-10-CM | POA: Diagnosis not present

## 2015-08-27 DIAGNOSIS — Z992 Dependence on renal dialysis: Secondary | ICD-10-CM | POA: Diagnosis not present

## 2015-08-29 DIAGNOSIS — N186 End stage renal disease: Secondary | ICD-10-CM | POA: Diagnosis not present

## 2015-08-29 DIAGNOSIS — D509 Iron deficiency anemia, unspecified: Secondary | ICD-10-CM | POA: Diagnosis not present

## 2015-08-29 DIAGNOSIS — Z992 Dependence on renal dialysis: Secondary | ICD-10-CM | POA: Diagnosis not present

## 2015-09-01 DIAGNOSIS — N186 End stage renal disease: Secondary | ICD-10-CM | POA: Diagnosis not present

## 2015-09-01 DIAGNOSIS — Z992 Dependence on renal dialysis: Secondary | ICD-10-CM | POA: Diagnosis not present

## 2015-09-01 DIAGNOSIS — D509 Iron deficiency anemia, unspecified: Secondary | ICD-10-CM | POA: Diagnosis not present

## 2015-09-03 DIAGNOSIS — D509 Iron deficiency anemia, unspecified: Secondary | ICD-10-CM | POA: Diagnosis not present

## 2015-09-03 DIAGNOSIS — Z992 Dependence on renal dialysis: Secondary | ICD-10-CM | POA: Diagnosis not present

## 2015-09-03 DIAGNOSIS — N186 End stage renal disease: Secondary | ICD-10-CM | POA: Diagnosis not present

## 2015-09-05 DIAGNOSIS — D509 Iron deficiency anemia, unspecified: Secondary | ICD-10-CM | POA: Diagnosis not present

## 2015-09-05 DIAGNOSIS — N186 End stage renal disease: Secondary | ICD-10-CM | POA: Diagnosis not present

## 2015-09-05 DIAGNOSIS — Z992 Dependence on renal dialysis: Secondary | ICD-10-CM | POA: Diagnosis not present

## 2015-09-08 DIAGNOSIS — Z992 Dependence on renal dialysis: Secondary | ICD-10-CM | POA: Diagnosis not present

## 2015-09-08 DIAGNOSIS — D509 Iron deficiency anemia, unspecified: Secondary | ICD-10-CM | POA: Diagnosis not present

## 2015-09-08 DIAGNOSIS — N186 End stage renal disease: Secondary | ICD-10-CM | POA: Diagnosis not present

## 2015-09-10 DIAGNOSIS — D509 Iron deficiency anemia, unspecified: Secondary | ICD-10-CM | POA: Diagnosis not present

## 2015-09-10 DIAGNOSIS — N186 End stage renal disease: Secondary | ICD-10-CM | POA: Diagnosis not present

## 2015-09-10 DIAGNOSIS — Z992 Dependence on renal dialysis: Secondary | ICD-10-CM | POA: Diagnosis not present

## 2015-09-11 DIAGNOSIS — Z992 Dependence on renal dialysis: Secondary | ICD-10-CM | POA: Diagnosis not present

## 2015-09-11 DIAGNOSIS — N186 End stage renal disease: Secondary | ICD-10-CM | POA: Diagnosis not present

## 2015-09-12 DIAGNOSIS — Z992 Dependence on renal dialysis: Secondary | ICD-10-CM | POA: Diagnosis not present

## 2015-09-12 DIAGNOSIS — N186 End stage renal disease: Secondary | ICD-10-CM | POA: Diagnosis not present

## 2015-09-12 DIAGNOSIS — D509 Iron deficiency anemia, unspecified: Secondary | ICD-10-CM | POA: Diagnosis not present

## 2015-09-12 DIAGNOSIS — N2581 Secondary hyperparathyroidism of renal origin: Secondary | ICD-10-CM | POA: Diagnosis not present

## 2015-09-15 DIAGNOSIS — Z992 Dependence on renal dialysis: Secondary | ICD-10-CM | POA: Diagnosis not present

## 2015-09-15 DIAGNOSIS — N2581 Secondary hyperparathyroidism of renal origin: Secondary | ICD-10-CM | POA: Diagnosis not present

## 2015-09-15 DIAGNOSIS — D509 Iron deficiency anemia, unspecified: Secondary | ICD-10-CM | POA: Diagnosis not present

## 2015-09-15 DIAGNOSIS — N186 End stage renal disease: Secondary | ICD-10-CM | POA: Diagnosis not present

## 2015-09-17 DIAGNOSIS — Z992 Dependence on renal dialysis: Secondary | ICD-10-CM | POA: Diagnosis not present

## 2015-09-17 DIAGNOSIS — N2581 Secondary hyperparathyroidism of renal origin: Secondary | ICD-10-CM | POA: Diagnosis not present

## 2015-09-17 DIAGNOSIS — D509 Iron deficiency anemia, unspecified: Secondary | ICD-10-CM | POA: Diagnosis not present

## 2015-09-17 DIAGNOSIS — N186 End stage renal disease: Secondary | ICD-10-CM | POA: Diagnosis not present

## 2015-09-19 DIAGNOSIS — Z992 Dependence on renal dialysis: Secondary | ICD-10-CM | POA: Diagnosis not present

## 2015-09-19 DIAGNOSIS — N2581 Secondary hyperparathyroidism of renal origin: Secondary | ICD-10-CM | POA: Diagnosis not present

## 2015-09-19 DIAGNOSIS — N186 End stage renal disease: Secondary | ICD-10-CM | POA: Diagnosis not present

## 2015-09-19 DIAGNOSIS — D509 Iron deficiency anemia, unspecified: Secondary | ICD-10-CM | POA: Diagnosis not present

## 2015-09-22 DIAGNOSIS — Z992 Dependence on renal dialysis: Secondary | ICD-10-CM | POA: Diagnosis not present

## 2015-09-22 DIAGNOSIS — N186 End stage renal disease: Secondary | ICD-10-CM | POA: Diagnosis not present

## 2015-09-22 DIAGNOSIS — N2581 Secondary hyperparathyroidism of renal origin: Secondary | ICD-10-CM | POA: Diagnosis not present

## 2015-09-22 DIAGNOSIS — D509 Iron deficiency anemia, unspecified: Secondary | ICD-10-CM | POA: Diagnosis not present

## 2015-09-24 ENCOUNTER — Encounter: Payer: Self-pay | Admitting: Emergency Medicine

## 2015-09-24 ENCOUNTER — Inpatient Hospital Stay
Admission: EM | Admit: 2015-09-24 | Discharge: 2015-09-25 | DRG: 640 | Disposition: A | Discharge status: Home / Self Care | Payer: Medicare Other | Attending: Internal Medicine | Admitting: Internal Medicine

## 2015-09-24 DIAGNOSIS — Z9889 Other specified postprocedural states: Secondary | ICD-10-CM

## 2015-09-24 DIAGNOSIS — K210 Gastro-esophageal reflux disease with esophagitis: Secondary | ICD-10-CM | POA: Diagnosis present

## 2015-09-24 DIAGNOSIS — N2581 Secondary hyperparathyroidism of renal origin: Secondary | ICD-10-CM | POA: Diagnosis present

## 2015-09-24 DIAGNOSIS — Z800 Family history of malignant neoplasm of digestive organs: Secondary | ICD-10-CM

## 2015-09-24 DIAGNOSIS — F1410 Cocaine abuse, uncomplicated: Secondary | ICD-10-CM | POA: Diagnosis present

## 2015-09-24 DIAGNOSIS — E875 Hyperkalemia: Secondary | ICD-10-CM | POA: Diagnosis not present

## 2015-09-24 DIAGNOSIS — Z7982 Long term (current) use of aspirin: Secondary | ICD-10-CM | POA: Diagnosis not present

## 2015-09-24 DIAGNOSIS — N179 Acute kidney failure, unspecified: Secondary | ICD-10-CM | POA: Diagnosis present

## 2015-09-24 DIAGNOSIS — I120 Hypertensive chronic kidney disease with stage 5 chronic kidney disease or end stage renal disease: Secondary | ICD-10-CM | POA: Diagnosis not present

## 2015-09-24 DIAGNOSIS — Z716 Tobacco abuse counseling: Secondary | ICD-10-CM

## 2015-09-24 DIAGNOSIS — D631 Anemia in chronic kidney disease: Secondary | ICD-10-CM | POA: Diagnosis present

## 2015-09-24 DIAGNOSIS — R45850 Homicidal ideations: Secondary | ICD-10-CM | POA: Diagnosis present

## 2015-09-24 DIAGNOSIS — Z992 Dependence on renal dialysis: Secondary | ICD-10-CM

## 2015-09-24 DIAGNOSIS — I2510 Atherosclerotic heart disease of native coronary artery without angina pectoris: Secondary | ICD-10-CM | POA: Diagnosis present

## 2015-09-24 DIAGNOSIS — K7460 Unspecified cirrhosis of liver: Secondary | ICD-10-CM | POA: Diagnosis present

## 2015-09-24 DIAGNOSIS — R45851 Suicidal ideations: Secondary | ICD-10-CM | POA: Diagnosis present

## 2015-09-24 DIAGNOSIS — E1122 Type 2 diabetes mellitus with diabetic chronic kidney disease: Secondary | ICD-10-CM | POA: Diagnosis present

## 2015-09-24 DIAGNOSIS — N186 End stage renal disease: Secondary | ICD-10-CM | POA: Diagnosis not present

## 2015-09-24 DIAGNOSIS — F17210 Nicotine dependence, cigarettes, uncomplicated: Secondary | ICD-10-CM | POA: Diagnosis present

## 2015-09-24 DIAGNOSIS — N189 Chronic kidney disease, unspecified: Secondary | ICD-10-CM | POA: Diagnosis not present

## 2015-09-24 DIAGNOSIS — R7989 Other specified abnormal findings of blood chemistry: Secondary | ICD-10-CM | POA: Diagnosis not present

## 2015-09-24 DIAGNOSIS — I129 Hypertensive chronic kidney disease with stage 1 through stage 4 chronic kidney disease, or unspecified chronic kidney disease: Secondary | ICD-10-CM | POA: Diagnosis not present

## 2015-09-24 DIAGNOSIS — E785 Hyperlipidemia, unspecified: Secondary | ICD-10-CM | POA: Diagnosis present

## 2015-09-24 DIAGNOSIS — I739 Peripheral vascular disease, unspecified: Secondary | ICD-10-CM | POA: Diagnosis present

## 2015-09-24 DIAGNOSIS — E1142 Type 2 diabetes mellitus with diabetic polyneuropathy: Secondary | ICD-10-CM | POA: Diagnosis present

## 2015-09-24 DIAGNOSIS — I10 Essential (primary) hypertension: Secondary | ICD-10-CM | POA: Diagnosis not present

## 2015-09-24 DIAGNOSIS — Z951 Presence of aortocoronary bypass graft: Secondary | ICD-10-CM

## 2015-09-24 DIAGNOSIS — Z79899 Other long term (current) drug therapy: Secondary | ICD-10-CM | POA: Diagnosis not present

## 2015-09-24 LAB — CBC WITH DIFFERENTIAL/PLATELET
BASOS ABS: 0.1 10*3/uL (ref 0–0.1)
BASOS PCT: 1 %
Eosinophils Absolute: 0.2 10*3/uL (ref 0–0.7)
Eosinophils Relative: 2 %
HEMATOCRIT: 43.2 % (ref 40.0–52.0)
HEMOGLOBIN: 14.2 g/dL (ref 13.0–18.0)
LYMPHS PCT: 13 %
Lymphs Abs: 0.9 10*3/uL — ABNORMAL LOW (ref 1.0–3.6)
MCH: 30.2 pg (ref 26.0–34.0)
MCHC: 32.9 g/dL (ref 32.0–36.0)
MCV: 91.8 fL (ref 80.0–100.0)
MONO ABS: 0.7 10*3/uL (ref 0.2–1.0)
Monocytes Relative: 10 %
NEUTROS ABS: 5.2 10*3/uL (ref 1.4–6.5)
NEUTROS PCT: 74 %
Platelets: 165 10*3/uL (ref 150–440)
RBC: 4.71 MIL/uL (ref 4.40–5.90)
RDW: 19.5 % — AB (ref 11.5–14.5)
WBC: 7.1 10*3/uL (ref 3.8–10.6)

## 2015-09-24 LAB — COMPREHENSIVE METABOLIC PANEL
ALBUMIN: 3.7 g/dL (ref 3.5–5.0)
ALK PHOS: 245 U/L — AB (ref 38–126)
ALT: 47 U/L (ref 17–63)
ANION GAP: 11 (ref 5–15)
AST: 32 U/L (ref 15–41)
BILIRUBIN TOTAL: 1 mg/dL (ref 0.3–1.2)
BUN: 61 mg/dL — ABNORMAL HIGH (ref 6–20)
CALCIUM: 9.2 mg/dL (ref 8.9–10.3)
CO2: 22 mmol/L (ref 22–32)
Chloride: 102 mmol/L (ref 101–111)
Creatinine, Ser: 8.19 mg/dL — ABNORMAL HIGH (ref 0.61–1.24)
GFR, EST AFRICAN AMERICAN: 8 mL/min — AB (ref >60)
GFR, EST NON AFRICAN AMERICAN: 6 mL/min — AB (ref >60)
GLUCOSE: 81 mg/dL (ref 65–99)
POTASSIUM: 6.8 mmol/L — AB (ref 3.5–5.1)
Sodium: 135 mmol/L (ref 135–145)
TOTAL PROTEIN: 7.7 g/dL (ref 6.5–8.1)

## 2015-09-24 LAB — BASIC METABOLIC PANEL
ANION GAP: 11 (ref 5–15)
BUN: 36 mg/dL — ABNORMAL HIGH (ref 6–20)
CALCIUM: 8.6 mg/dL — AB (ref 8.9–10.3)
CO2: 26 mmol/L (ref 22–32)
Chloride: 100 mmol/L — ABNORMAL LOW (ref 101–111)
Creatinine, Ser: 5.47 mg/dL — ABNORMAL HIGH (ref 0.61–1.24)
GFR calc Af Amer: 12 mL/min — ABNORMAL LOW (ref >60)
GFR, EST NON AFRICAN AMERICAN: 11 mL/min — AB (ref >60)
GLUCOSE: 171 mg/dL — AB (ref 65–99)
Potassium: 4.6 mmol/L (ref 3.5–5.1)
SODIUM: 137 mmol/L (ref 135–145)

## 2015-09-24 LAB — MRSA PCR SCREENING: MRSA BY PCR: NEGATIVE

## 2015-09-24 LAB — HEMOGLOBIN: Hemoglobin: 13.4 g/dL (ref 13.0–18.0)

## 2015-09-24 MED ORDER — PANTOPRAZOLE SODIUM 40 MG PO TBEC
40.0000 mg | DELAYED_RELEASE_TABLET | Freq: Every day | ORAL | Status: DC
  Administered 2015-09-24: 40 mg via ORAL

## 2015-09-24 MED ORDER — ADULT MULTIVITAMIN W/MINERALS CH
ORAL_TABLET | ORAL | Status: AC
  Administered 2015-09-24: 1 via ORAL
  Filled 2015-09-24: qty 1

## 2015-09-24 MED ORDER — FUROSEMIDE 40 MG PO TABS: 40.0000 mg | ORAL_TABLET | Freq: Every day | ORAL | Status: DC

## 2015-09-24 MED ORDER — GABAPENTIN 300 MG PO CAPS
300.0000 mg | ORAL_CAPSULE | Freq: Every day | ORAL | Status: DC
  Administered 2015-09-24 – 2015-09-25 (×2): 300 mg via ORAL
  Filled 2015-09-24: qty 1

## 2015-09-24 MED ORDER — DEXTROSE 50 % IV SOLN
50.0000 mL | Freq: Once | INTRAVENOUS | Status: AC
  Administered 2015-09-24: 50 mL via INTRAVENOUS
  Filled 2015-09-24: qty 50

## 2015-09-24 MED ORDER — CALCIUM ACETATE (PHOS BINDER) 667 MG PO CAPS
2001.0000 mg | ORAL_CAPSULE | Freq: Three times a day (TID) | ORAL | Status: DC
  Administered 2015-09-24 (×2): 2001 mg via ORAL
  Filled 2015-09-24 (×3): qty 3

## 2015-09-24 MED ORDER — PANTOPRAZOLE SODIUM 40 MG PO TBEC
DELAYED_RELEASE_TABLET | ORAL | Status: AC
  Administered 2015-09-24: 40 mg via ORAL
  Filled 2015-09-24: qty 1

## 2015-09-24 MED ORDER — SODIUM CHLORIDE 0.9% FLUSH
3.0000 mL | Freq: Two times a day (BID) | INTRAVENOUS | Status: DC
  Administered 2015-09-24 (×2): 3 mL via INTRAVENOUS

## 2015-09-24 MED ORDER — GABAPENTIN 300 MG PO CAPS
ORAL_CAPSULE | ORAL | Status: AC
  Administered 2015-09-24: 300 mg via ORAL
  Filled 2015-09-24: qty 1

## 2015-09-24 MED ORDER — SODIUM BICARBONATE 8.4 % IV SOLN
50.0000 meq | Freq: Once | INTRAVENOUS | Status: AC
  Administered 2015-09-24: 50 meq via INTRAVENOUS
  Filled 2015-09-24: qty 50

## 2015-09-24 MED ORDER — INSULIN ASPART 100 UNIT/ML ~~LOC~~ SOLN
10.0000 [IU] | Freq: Once | SUBCUTANEOUS | Status: AC
  Administered 2015-09-24: 10 [IU] via INTRAVENOUS
  Filled 2015-09-24: qty 10

## 2015-09-24 MED ORDER — OXYCODONE HCL 5 MG PO TABS
5.0000 mg | ORAL_TABLET | Freq: Once | ORAL | Status: AC
  Administered 2015-09-24: 5 mg via ORAL
  Filled 2015-09-24: qty 1

## 2015-09-24 MED ORDER — LABETALOL HCL 200 MG PO TABS
200.0000 mg | ORAL_TABLET | Freq: Every day | ORAL | Status: DC
  Filled 2015-09-24: qty 1

## 2015-09-24 MED ORDER — TRAMADOL HCL 50 MG PO TABS
50.0000 mg | ORAL_TABLET | Freq: Three times a day (TID) | ORAL | Status: DC | PRN
  Administered 2015-09-24 – 2015-09-25 (×3): 50 mg via ORAL
  Filled 2015-09-24 (×3): qty 1

## 2015-09-24 MED ORDER — ADULT MULTIVITAMIN W/MINERALS CH
1.0000 | ORAL_TABLET | Freq: Every day | ORAL | Status: DC
Start: 2015-09-24 — End: 2015-09-25
  Administered 2015-09-24: 1 via ORAL

## 2015-09-24 MED ORDER — CALCIUM GLUCONATE 10 % IV SOLN
1.0000 g | Freq: Once | INTRAVENOUS | Status: AC
  Administered 2015-09-24: 1 g via INTRAVENOUS
  Filled 2015-09-24: qty 10

## 2015-09-24 MED ORDER — ACETAMINOPHEN 325 MG PO TABS: 650.0000 mg | ORAL_TABLET | Freq: Four times a day (QID) | ORAL | Status: DC | PRN

## 2015-09-24 MED ORDER — ATORVASTATIN CALCIUM 20 MG PO TABS
40.0000 mg | ORAL_TABLET | Freq: Every day | ORAL | Status: DC
  Administered 2015-09-24: 40 mg via ORAL
  Filled 2015-09-24: qty 2

## 2015-09-24 MED ORDER — ACETAMINOPHEN 650 MG RE SUPP: 650.0000 mg | Freq: Four times a day (QID) | RECTAL | Status: DC | PRN

## 2015-09-24 NOTE — H&P (Signed)
[name] NAME: Stanley [name]    MR#:  [id number]  DATE OF BIRTH:  [birth year]  DATE OF ADMISSION:  09/24/2015  PRIMARY CARE PHYSICIAN: [facility]  REQUESTING/REFERRING PHYSICIAN: Dr Lisa Roca  CHIEF COMPLAINT:   Chief Complaint  Patient presents with  . Abnormal Lab    potassium 8.4    HISTORY OF PRESENT ILLNESS:  Stanley [name]  is a 55 y.o. male with history of end-stage renal disease on dialysis. He states that he had his normal dialysis on Tuesday. The ER physicians stated that he was called with a potassium of 8.4 to come to the hospital yesterday. The patient came into the hospital today and had a repeat potassium of 6.8 and hospitalist services were contacted for admission for severe hyperkalemia. The patient was eating popcorn as I was talking with him. He states that he is also been eating lots of tomatoes recently.  PAST MEDICAL HISTORY:   Past Medical History  Diagnosis Date  . Coronary artery disease   . Hypertension   . GERD (gastroesophageal reflux disease)   . Suicidal ideation     & HOMICIDAL IDEATION --  06-16-2013   ADMITTED TO BEHAVIOR HEALTH  . Alcohol abuse   . Drug abuse   . Diabetic peripheral neuropathy ([place])   . End stage renal disease on dialysis [facility]) NEPHROLOGIST-   DR [facility]  IN [place]    HEMODIALYSIS --   TUES/  THURS/  SAT  . Cirrhosis ([place])     PAST SURGICAL HISTORY:   Past Surgical History  Procedure Laterality Date  . Dialysis fistula creation  LAST SURGERY  APPOX  2008  . Orif mandibular fracture N/A 06/05/2013    Procedure: REPAIR OF MANDIBULAR FRACTURE x 2 with maxillo-mandibular fixation ;  Surgeon: Theodoro Kos, DO;  Location: [place];  Service: Plastics;  Laterality: N/A;  . Coronary artery bypass graft  2008  ([place] [facility] [place])    3 VESSEL  . Coronary angioplasty  ?    PT UNABLE TO TELL IF  BEFORE OR AFTER  CABG  . Mandibular hardware removal N/A  07/29/2013    Procedure: REMOVAL OF ARCH BARS;  Surgeon: Theodoro Kos, DO;  Location: [place];  Service: Plastics;  Laterality: N/A;  . Esophagogastroduodenoscopy N/A 05/07/2015    Procedure: ESOPHAGOGASTRODUODENOSCOPY (EGD);  Surgeon: Hulen Luster, MD;  Location: [facility] ENDOSCOPY;  Service: Endoscopy;  Laterality: N/A;  . Esophagogastroduodenoscopy (egd) with propofol N/A 05/17/2015    Procedure: ESOPHAGOGASTRODUODENOSCOPY (EGD) WITH PROPOFOL;  Surgeon: [name], MD;  Location: [facility] ENDOSCOPY;  Service: Endoscopy;  Laterality: N/A;    SOCIAL HISTORY:   Social History  Substance Use Topics  . Smoking status: Light Tobacco Smoker -- 0.00 packs/day for 40 years    Types: Cigarettes  . Smokeless tobacco: Never Used  . Alcohol Use: Yes     Comment: 1 pint/occasionally    FAMILY HISTORY:   Family History  Problem Relation Age of Onset  . Colon cancer Mother   . Cancer Father   . Cancer Sister     DRUG ALLERGIES:  No Known Allergies  REVIEW OF SYSTEMS:  CONSTITUTIONAL: No fever. Positive for weakness EYES: No blurred or double vision.  EARS, NOSE, AND THROAT: No tinnitus or ear pain. No sore throat RESPIRATORY: No cough, shortness of breath, wheezing or hemoptysis.  CARDIOVASCULAR: No chest pain, orthopnea, edema.  GASTROINTESTINAL: No nausea, vomiting, diarrhea or abdominal pain.  Positive for bright red blood per rectum every time he goes to the bathroom. GENITOURINARY: Does not make urine  ENDOCRINE: No polyuria, nocturia,  HEMATOLOGY: History of anemia, no easy bruising or bleeding SKIN: No rash or lesion. MUSCULOSKELETAL: No joint pain or arthritis.   NEUROLOGIC: No tingling, numbness, weakness.  PSYCHIATRY: No anxiety or depression.   MEDICATIONS AT HOME:   Prior to Admission medications   Medication Sig Start Date End Date Taking? Authorizing Provider  aspirin EC 81 MG tablet Take 81 mg by mouth daily.   Yes Historical Provider, MD  atorvastatin  (LIPITOR) 40 MG tablet Take 40 mg by mouth daily.   Yes Historical Provider, MD  calcium acetate (PHOSLO) 667 MG capsule Take 2,001 mg by mouth 3 (three) times daily with meals.   Yes Historical Provider, MD  furosemide (LASIX) 40 MG tablet Take 1 tablet (40 mg total) by mouth daily. 04/07/15  Yes Bettey Costa, MD  gabapentin (NEURONTIN) 300 MG capsule Take 1 capsule (300 mg total) by mouth daily. 03/27/15  Yes Bettey Costa, MD  labetalol (NORMODYNE) 200 MG tablet Take 200 mg by mouth daily.    Yes Historical Provider, MD  Multiple Vitamins-Minerals (MULTIVITAMIN WITH MINERALS) tablet Take 1 tablet by mouth daily.   Yes Historical Provider, MD  pantoprazole (PROTONIX) 40 MG tablet Take 1 tablet (40 mg total) by mouth daily. 05/17/15  Yes Fritzi Mandes, MD      VITAL SIGNS:  Blood pressure 157/94, pulse 73, temperature 98.3 F (36.8 C), temperature source Oral, resp. rate 25, height 6\' 3"  (1.905 m), weight 81.647 kg (180 lb), SpO2 96 %.  PHYSICAL EXAMINATION:  GENERAL:  55 y.o.-year-old patient lying in the bed with no acute distress.  EYES: Pupils equal, round, reactive to light and accommodation. No scleral icterus. Extraocular muscles intact.  HEENT: Head atraumatic, normocephalic. Oropharynx and nasopharynx clear.  NECK:  Supple, no jugular venous distention. No thyroid enlargement, no tenderness.  LUNGS: Normal breath sounds bilaterally, no wheezing, rales,rhonchi or crepitation. No use of accessory muscles of respiration.  CARDIOVASCULAR: S1, S2 normal. No murmurs, rubs, or gallops.  ABDOMEN: Soft, nontender, nondistended. Bowel sounds present. No organomegaly or mass.  EXTREMITIES: No pedal edema, cyanosis, or clubbing.  NEUROLOGIC: Cranial nerves II through XII are intact. Muscle strength 5/5 in all extremities. Sensation intact. Gait not checked.  PSYCHIATRIC: The patient is alert and oriented x 3.  SKIN: No rash, lesion, or ulcer.   LABORATORY PANEL:   CBC  Recent Labs Lab  09/24/15 0919  WBC 7.1  HGB 14.2  HCT 43.2  PLT 165   ------------------------------------------------------------------------------------------------------------------  Chemistries   Recent Labs Lab 09/24/15 0919  NA 135  K 6.8*  CL 102  CO2 22  GLUCOSE 81  BUN 61*  CREATININE 8.19*  CALCIUM 9.2  AST 32  ALT 47  ALKPHOS 245*  BILITOT 1.0   ------------------------------------------------------------------------------------------------------------------    EKG:   Normal sinus rhythm 79 bpm no acute ST-T wave changes.  IMPRESSION AND PLAN:   1. Life threatening hyperkalemia. Likely dietary in nature. Patient states that he's been going to dialysis on Tuesday Thursday Saturday and Saturdays. ER physician spoke with Dr. Rolly Salter to do dialysis today. Calcium gluconate, insulin and D50 and sodium bicarbonate ordered. Since dialysis will be done today I will hold off on any Kayexalate treatments. Low potassium diet needed. 2. End-stage renal disease requiring urgent dialysis for hyperkalemia. 3. Hyperlipidemia unspecified on atorvastatin 4. Essential hypertension continue usual medications. Since the patient  does not make urine not sure Lasix would be that effective. 5. History of CAD and PAD. Hold aspirin with his bleeding. 6. Bright red blood per rectum every time he goes to the bathroom. Reviewing old records he did have a colonoscopy in February of this year that showed a removal of polyp. He did have an endoscopy that showed esophagitis and angiectasia is that were treated. His hemoglobin is much improved than what it has been in the past. Continue Protonix. Stop aspirin. Guaiac stools. 7. Tobacco abuse smoking cessation counseling done 3 minutes by me. Low-dose nicotine patch ordered 8. History of cirrhosis and alcohol use. I advised that alcohol is not good for those people that have liver disease. 9. Cocaine abuse. I advised that this can cause coronary vasospasm and  heart attacks.  All the records are reviewed and case discussed with ED provider. Management plans discussed with the patient, family and they are in agreement.  CODE STATUS: Full code  TOTAL TIME TAKING CARE OF THIS PATIENT: 50 minutes.    Loletha Grayer M.D on 09/24/2015 at 10:39 AM  Between 7am to 6pm - Pager - [phone]  After 6pm call admission pager [phone]  [facility]  [phone]  CC: Primary care physician; [facility] clinic

## 2015-09-24 NOTE — Progress Notes (Signed)
Dialysis complete

## 2015-09-24 NOTE — Progress Notes (Signed)
Dialysis started 

## 2015-09-24 NOTE — Progress Notes (Signed)
[facility]  ROUNDING NOTE   Subjective:   Admitted for hyperkalemia. Labs on 7/11 show a potassium of 8.3. He was told to proceed to the emergency room. However he did not go. He now shows up with a potassium of 6.8. He had a full treatment on Tuesday.  He has been eating tomatoes.   Objective:  Vital signs in last 24 hours:  Temp:  [98.2 F (36.8 C)-98.3 F (36.8 C)] 98.2 F (36.8 C) (07/13 1330) Pulse Rate:  [73-79] 77 (07/13 1400) Resp:  [14-25] 16 (07/13 1400) BP: (139-157)/(78-110) 147/78 mmHg (07/13 1400) SpO2:  [96 %-99 %] 99 % (07/13 1400) Weight:  [81.647 kg (180 lb)-86.8 kg (191 lb 5.8 oz)] 86.8 kg (191 lb 5.8 oz) (07/13 1330)  Weight change:  Filed Weights   09/24/15 0901 09/24/15 1330  Weight: 81.647 kg (180 lb) 86.8 kg (191 lb 5.8 oz)    Intake/Output:     Intake/Output this shift:     Physical Exam: General: NAD,   Head: Normocephalic, atraumatic. Moist oral mucosal membranes  Eyes: Anicteric, PERRL  Neck: Supple, trachea midline  Lungs:  Clear to auscultation  Heart: Regular rate and rhythm  Abdomen:  Soft, nontender,   Extremities: no peripheral edema.  Neurologic: Nonfocal, moving all four extremities  Skin: No lesions  Access: Right arm AVF    Basic Metabolic Panel:  Recent Labs Lab 09/24/15 0919  NA 135  K 6.8*  CL 102  CO2 22  GLUCOSE 81  BUN 61*  CREATININE 8.19*  CALCIUM 9.2    Liver Function Tests:  Recent Labs Lab 09/24/15 0919  AST 32  ALT 47  ALKPHOS 245*  BILITOT 1.0  PROT 7.7  ALBUMIN 3.7   No results for input(s): LIPASE, AMYLASE in the last 168 hours. No results for input(s): AMMONIA in the last 168 hours.  CBC:  Recent Labs Lab 09/24/15 0919  WBC 7.1  NEUTROABS 5.2  HGB 14.2  HCT 43.2  MCV 91.8  PLT 165    Cardiac Enzymes: No results for input(s): CKTOTAL, CKMB, CKMBINDEX, TROPONINI in the last 168 hours.  BNP: Invalid input(s): POCBNP  CBG: No results for input(s): GLUCAP in  the last 168 hours.  Microbiology: Results for orders placed or performed during the hospital encounter of 09/24/15  MRSA PCR Screening     Status: None   Collection Time: 09/24/15 12:09 PM  Result Value Ref Range Status   MRSA by PCR NEGATIVE NEGATIVE Final    Comment:        The GeneXpert MRSA Assay (FDA approved for NASAL specimens only), is one component of a comprehensive MRSA colonization surveillance program. It is not intended to diagnose MRSA infection nor to guide or monitor treatment for MRSA infections.     Coagulation Studies: No results for input(s): LABPROT, INR in the last 72 hours.  Urinalysis: No results for input(s): COLORURINE, LABSPEC, PHURINE, GLUCOSEU, HGBUR, BILIRUBINUR, KETONESUR, PROTEINUR, UROBILINOGEN, NITRITE, LEUKOCYTESUR in the last 72 hours.  Invalid input(s): APPERANCEUR    Imaging: No results found.   Medications:     . atorvastatin  40 mg Oral q1800  . calcium acetate  2,001 mg Oral TID WC  . [START ON 09/25/2015] furosemide  40 mg Oral Daily  . gabapentin  300 mg Oral Daily  . labetalol  200 mg Oral Daily  . multivitamin with minerals  1 tablet Oral Daily  . pantoprazole  40 mg Oral Daily  . sodium chloride flush  3 mL Intravenous Q12H   acetaminophen **OR** acetaminophen  Assessment/ Plan:  [name] is a 55 y.o. black male with coronary artery disease, hypertension, peripheral vascular disease, hyperlipidemia, anemia  TTS [facility].   1. End Stage Renal Disease with hyperkalemia:  Placed on hemodialysis. 2K bath.  - tolerating treatment well - low potassium diet  2. Hypertension: elevated on admission - restart home medications. Furosemide, labetolol.   3. Secondary Hyperparathyroidism: PTH 379, phosphorus 4.1, calcium 10.  - continue calcium acetate  4. Anemia of chronic kidney disease: hemoglobin at normal range.  - not currently on epo.    LOS: 0 Stanley [name] 7/13/20172:27 PM

## 2015-09-24 NOTE — ED Notes (Addendum)
Pt presents to ED via EMS from home c/o hyperkalemia. Pt states Dr. Holley Raring called him yesterday and told him his potassium was 8.4. EMS reports pt did not come in yesterday because he did not want to wait. Pt states he didn't come yesterday because "I was drinking." Reports he drinks approx. "1 pint of liquor every now and then."  Tu/Th/Sat dialysis patient, did go to dialysis Tuesday but has not been today. C/o chronic pain to L foot, has had arteries removed from foot. States nephrologist took him off of percocet d/t dialysis.

## 2015-09-24 NOTE — Progress Notes (Signed)
Post dialysis 

## 2015-09-24 NOTE — Progress Notes (Signed)
Pt arrived on unit. VSS. RN talked to dialysis nurse, pt is heading to dialysis within in one hour for emergent dialysis due to potassium levels. Pt is resting conformability at this time. Cardiac telemetry was applied to pt. Will continue to monitor pt closely.   Stanley [name]

## 2015-09-24 NOTE — Progress Notes (Signed)
Pre Dialysis 

## 2015-09-24 NOTE — ED Provider Notes (Signed)
[facility] Emergency Department Provider Note   ____________________________________________  Time seen: Approximately 9:50am I have reviewed the triage vital signs and the triage nursing note.  HISTORY  Chief Complaint Abnormal Lab   Historian Patient  HPI Stanley [name] is a 55 y.o. male ESRD on dialysis TThSat, last dialysis on Tuesday, states was told to come to the ER due to high potassium, it sounds like maybe was drawn Tuesday? But in any case was called yesterday with potassium above 8 and was told to come to the ER. He chose not to come to the ER yesterday.  Patient states he has not had chest pain or nausea.  Patient is essentially this point in time asymptomatic.    Past Medical History  Diagnosis Date  . Coronary artery disease   . Hypertension   . GERD (gastroesophageal reflux disease)   . Suicidal ideation     & HOMICIDAL IDEATION --  06-16-2013   ADMITTED TO BEHAVIOR HEALTH  . Alcohol abuse   . Drug abuse   . Diabetic peripheral neuropathy ([place])   . End stage renal disease on dialysis [facility]) NEPHROLOGIST-   DR [facility]  IN [place]    HEMODIALYSIS --   TUES/  THURS/  SAT  . Cirrhosis [facility])     Patient Active Problem List   Diagnosis Date Noted  . Blood in stool   . Angiodysplasia of stomach and duodenum with hemorrhage   . Gastritis   . Esophagitis, unspecified   . GI bleed 05/16/2015  . Acute GI bleeding   . Symptomatic anemia 04/30/2015  . HTN (hypertension) 04/06/2015  . GERD (gastroesophageal reflux disease) 04/06/2015  . HLD (hyperlipidemia) 04/06/2015  . Dyspnea 04/06/2015  . Cirrhosis ([place]) 04/06/2015  . Ascites 04/06/2015  . GIB (gastrointestinal bleeding) 03/23/2015  . Homicidal ideation 06/19/2013  . Suicidal intent 06/19/2013  . Homicidal ideations 06/19/2013  . Hyperkalemia 06/16/2013  . Mandible fracture ([place]) 06/05/2013  . Fracture, mandible ([place]) 06/02/2013  . Coronary atherosclerosis of native  coronary artery 06/02/2013  . ESRD on dialysis ([place]) 06/02/2013  . Mandible open fracture ([place]) 06/02/2013    Past Surgical History  Procedure Laterality Date  . Dialysis fistula creation  LAST SURGERY  APPOX  2008  . Orif mandibular fracture N/A 06/05/2013    Procedure: REPAIR OF MANDIBULAR FRACTURE x 2 with maxillo-mandibular fixation ;  Surgeon: Theodoro Kos, DO;  Location: [place];  Service: Plastics;  Laterality: N/A;  . Coronary artery bypass graft  2008  ([place] , [place])    3 VESSEL  . Coronary angioplasty  ?    PT UNABLE TO TELL IF  BEFORE OR AFTER  CABG  . Mandibular hardware removal N/A 07/29/2013    Procedure: REMOVAL OF ARCH BARS;  Surgeon: Theodoro Kos, DO;  Location: [place];  Service: Plastics;  Laterality: N/A;  . Esophagogastroduodenoscopy N/A 05/07/2015    Procedure: ESOPHAGOGASTRODUODENOSCOPY (EGD);  Surgeon: Hulen Luster, MD;  Location: [facility] ENDOSCOPY;  Service: Endoscopy;  Laterality: N/A;  . Esophagogastroduodenoscopy (egd) with propofol N/A 05/17/2015    Procedure: ESOPHAGOGASTRODUODENOSCOPY (EGD) WITH PROPOFOL;  Surgeon: [name], MD;  Location: [facility] ENDOSCOPY;  Service: Endoscopy;  Laterality: N/A;    Current Outpatient Rx  Name  Route  Sig  Dispense  Refill  . aspirin EC 81 MG tablet   Oral   Take 81 mg by mouth daily.         Marland Kitchen atorvastatin (LIPITOR) 40 MG tablet  Oral   Take 40 mg by mouth daily.         . benazepril (LOTENSIN) 10 MG tablet   Oral   Take 10 mg by mouth daily.         . calcium acetate (PHOSLO) 667 MG capsule   Oral   Take 2,001 mg by mouth 3 (three) times daily with meals.         . furosemide (LASIX) 40 MG tablet   Oral   Take 1 tablet (40 mg total) by mouth daily.   30 tablet   0   . gabapentin (NEURONTIN) 300 MG capsule   Oral   Take 1 capsule (300 mg total) by mouth daily.   30 capsule   0   . labetalol (NORMODYNE) 200 MG tablet   Oral   Take 200 mg by mouth daily.          . Multiple  Vitamins-Minerals (MULTIVITAMIN WITH MINERALS) tablet   Oral   Take 1 tablet by mouth daily.         . pantoprazole (PROTONIX) 40 MG tablet   Oral   Take 1 tablet (40 mg total) by mouth daily.   30 tablet   2   . SYMBICORT 160-4.5 MCG/ACT inhaler   Inhalation   Inhale 2 puffs into the lungs 2 (two) times daily.      3     Dispense as written.     Allergies Review of patient's allergies indicates no known allergies.  Family History  Problem Relation Age of Onset  . Cancer Mother   . Cancer Father   . Cancer Sister     Social History Social History  Substance Use Topics  . Smoking status: Light Tobacco Smoker -- 0.00 packs/day for 40 years    Types: Cigarettes  . Smokeless tobacco: Never Used  . Alcohol Use: Yes     Comment: 1 pint/occasionally    Review of Systems  Constitutional: Negative for fever. Eyes: Negative for visual changes. ENT: Negative for sore throat. Cardiovascular: Negative for chest pain. Respiratory: Negative for shortness of breath. Gastrointestinal: Negative for abdominal pain, vomiting and diarrhea. Genitourinary: Negative for dysuria. Musculoskeletal: Negative for back pain. Skin: Negative for rash. Neurological: Negative for headache. 10 point Review of Systems otherwise negative ____________________________________________   PHYSICAL EXAM:  VITAL SIGNS: ED Triage Vitals  Enc Vitals Group     BP 09/24/15 0900 141/100 mmHg     Pulse Rate 09/24/15 0900 78     Resp 09/24/15 0901 15     Temp 09/24/15 0901 98.3 F (36.8 C)     Temp Source 09/24/15 0901 Oral     SpO2 09/24/15 0900 97 %     Weight 09/24/15 0901 180 lb (81.647 kg)     Height 09/24/15 0901 6\' 3"  (1.905 m)     Head Cir --      Peak Flow --      Pain Score 09/24/15 0902 8     Pain Loc --      Pain Edu? --      Excl. in [place]? --      Constitutional: Alert and oriented. Well appearing and in no distress. HEENT   Head: Normocephalic and atraumatic.       Eyes: Conjunctivae are normal. PERRL. Normal extraocular movements.      Ears:         Nose: No congestion/rhinnorhea.   Mouth/Throat: Mucous membranes are moist.   Neck: No stridor.  Cardiovascular/Chest: Normal rate, regular rhythm.  No murmurs, rubs, or gallops. Respiratory: Normal respiratory effort without tachypnea nor retractions. Breath sounds are clear and equal bilaterally. No wheezes/rales/rhonchi. Gastrointestinal: Soft. No distention, no guarding, no rebound. Nontender.    Genitourinary/rectal:Deferred Musculoskeletal: Nontender with normal range of motion in all extremities. No joint effusions.  No lower extremity tenderness.  No edema. Neurologic:  Normal speech and language. No gross or focal neurologic deficits are appreciated. Skin:  Skin is warm, dry and intact. No rash noted. Psychiatric: Mood and affect are normal. Speech and behavior are normal. Patient exhibits appropriate insight and judgment.  ____________________________________________   EKG I, Lisa Roca, MD, the attending physician have personally viewed and interpreted all ECGs.  79 bpm. Narrow QRS. Normal axis. Nonspecific T-wave with T-wave inversions inferiorly. Mildly peaked T waves in V5 and V6. ____________________________________________  LABS (pertinent positives/negatives)  Labs Reviewed  COMPREHENSIVE METABOLIC PANEL - Abnormal; Notable for the following:    Potassium 6.8 (*)    BUN 61 (*)    Creatinine, Ser 8.19 (*)    Alkaline Phosphatase 245 (*)    GFR calc non Af Amer 6 (*)    GFR calc Af Amer 8 (*)    All other components within normal limits  CBC WITH DIFFERENTIAL/PLATELET - Abnormal; Notable for the following:    RDW 19.5 (*)    Lymphs Abs 0.9 (*)    All other components within normal limits    ____________________________________________  RADIOLOGY All Xrays were viewed by me. Imaging interpreted by  Radiologist.  None __________________________________________  PROCEDURES  Procedure(s) performed: None  Critical Care performed: CRITICAL CARE Performed by: Lisa Roca   Total critical care time: 30 minutes  Critical care time was exclusive of separately billable procedures and treating other patients.  Critical care was necessary to treat or prevent imminent or life-threatening deterioration.  Critical care was time spent personally by me on the following activities: development of treatment plan with patient and/or surrogate as well as nursing, discussions with consultants, evaluation of patient's response to treatment, examination of patient, obtaining history from patient or surrogate, ordering and performing treatments and interventions, ordering and review of laboratory studies, ordering and review of radiographic studies, pulse oximetry and re-evaluation of patient's condition.   ____________________________________________   ED COURSE / ASSESSMENT AND PLAN  Pertinent labs & imaging results that were available during my care of the patient were reviewed by me and considered in my medical decision making (see chart for details).    Patient arrived with a history of hyperkalemia on outpatient labs reportedly, and here potassium is elevated at 6.8. He is not having any QRS widening by EKG, then does have mildly peaked T waves in V5 and V6. I am going to go ahead and treat with IV calcium, bicarbonate, insulin and glucose for immediate treatment of hyperkalemia.    I spoke with nephrologist who will arrange for dialysis today as an inpatient. I spoke with the hospitalist, Dr. Earleen Newport for admission.    CONSULTATIONS:   Nephrologist and hospitalist.   Patient / Family / Caregiver informed of clinical course, medical decision-making process, and agree with plan.    ___________________________________________   FINAL CLINICAL IMPRESSION(S) / ED DIAGNOSES   Final  diagnoses:  Acute on chronic renal failure ([place])  Hyperkalemia              Note: This dictation was prepared with Dragon dictation. Any transcriptional errors that result from this process are unintentional  Lisa Roca, MD 09/24/15 1016

## 2015-09-25 LAB — BASIC METABOLIC PANEL
ANION GAP: 9 (ref 5–15)
BUN: 45 mg/dL — ABNORMAL HIGH (ref 6–20)
CHLORIDE: 98 mmol/L — AB (ref 101–111)
CO2: 26 mmol/L (ref 22–32)
CREATININE: 6.54 mg/dL — AB (ref 0.61–1.24)
Calcium: 8.5 mg/dL — ABNORMAL LOW (ref 8.9–10.3)
GFR calc Af Amer: 10 mL/min — ABNORMAL LOW (ref >60)
GFR calc non Af Amer: 8 mL/min — ABNORMAL LOW (ref >60)
Glucose, Bld: 108 mg/dL — ABNORMAL HIGH (ref 65–99)
Potassium: 5.1 mmol/L (ref 3.5–5.1)
SODIUM: 133 mmol/L — AB (ref 135–145)

## 2015-09-25 LAB — HEPATITIS B SURFACE ANTIGEN: Hepatitis B Surface Ag: NEGATIVE

## 2015-09-25 LAB — CBC
HCT: 41.4 % (ref 40.0–52.0)
HEMOGLOBIN: 13.6 g/dL (ref 13.0–18.0)
MCH: 30.6 pg (ref 26.0–34.0)
MCHC: 32.9 g/dL (ref 32.0–36.0)
MCV: 93.1 fL (ref 80.0–100.0)
Platelets: 150 10*3/uL (ref 150–440)
RBC: 4.44 MIL/uL (ref 4.40–5.90)
RDW: 19.1 % — ABNORMAL HIGH (ref 11.5–14.5)
WBC: 6 10*3/uL (ref 3.8–10.6)

## 2015-09-25 NOTE — Discharge Summary (Signed)
Stanley [name], is a 55 y.o. male  DOB [birth year]  MRN [id number].  Admission date:  09/24/2015  Admitting Physician  Loletha Grayer, MD  Discharge Date:  09/25/2015   Primary MD  LATEEF, Mora Appl, MD  Recommendations for primary care physician for things to follow:     Admission Diagnosis  Hyperkalemia [E87.5] Acute on chronic renal failure ([place]) [N17.9, N18.9]   Discharge Diagnosis  Hyperkalemia [E87.5] Acute on chronic renal failure ([place]) [N17.9, N18.9]    Active Problems:   Hyperkalemia      Past Medical History  Diagnosis Date  . Coronary artery disease   . Hypertension   . GERD (gastroesophageal reflux disease)   . Suicidal ideation     & HOMICIDAL IDEATION --  06-16-2013   ADMITTED TO BEHAVIOR HEALTH  . Alcohol abuse   . Drug abuse   . Diabetic peripheral neuropathy ([place])   . End stage renal disease on dialysis [facility]) NEPHROLOGIST-   DR [facility]  IN [place]    HEMODIALYSIS --   TUES/  THURS/  SAT  . Cirrhosis [facility])     Past Surgical History  Procedure Laterality Date  . Dialysis fistula creation  LAST SURGERY  APPOX  2008  . Orif mandibular fracture N/A 06/05/2013    Procedure: REPAIR OF MANDIBULAR FRACTURE x 2 with maxillo-mandibular fixation ;  Surgeon: Theodoro Kos, DO;  Location: [place];  Service: Plastics;  Laterality: N/A;  . Coronary artery bypass graft  2008  ([place] , [place])    3 VESSEL  . Coronary angioplasty  ?    PT UNABLE TO TELL IF  BEFORE OR AFTER  CABG  . Mandibular hardware removal N/A 07/29/2013    Procedure: REMOVAL OF ARCH BARS;  Surgeon: Theodoro Kos, DO;  Location: [place];  Service: Plastics;  Laterality: N/A;  . Esophagogastroduodenoscopy N/A 05/07/2015    Procedure: ESOPHAGOGASTRODUODENOSCOPY (EGD);  Surgeon: Hulen Luster, MD;  Location: [facility] ENDOSCOPY;   Service: Endoscopy;  Laterality: N/A;  . Esophagogastroduodenoscopy (egd) with propofol N/A 05/17/2015    Procedure: ESOPHAGOGASTRODUODENOSCOPY (EGD) WITH PROPOFOL;  Surgeon: [name], MD;  Location: [facility] ENDOSCOPY;  Service: Endoscopy;  Laterality: N/A;       History of present illness and  Hospital Course:     Kindly see H&P for history of present illness and admission details, please review complete Labs, Consult reports and Test reports for all details in brief  HPI  from the history and physical done on the day of admission 55 year old male patient admitted because of abnormal labs with potassium 8.4. Patient is a ESRD patient on dialysis Tuesday Thursday Saturday. Patient is not careful with diet at home eating lots of watermelon, oranges at home. Patient is admitted for severe hyperkalemia.   Hospital Course   1 life-threatening hyperkalemia secondary to dietary indiscretion: Patient is admitted to hospitalist service on telemetry, received calcium gluconate, insulin, D50, sodium bicarbonate. Seen by nephrology, urgent hemodialysis is arranged. Patient did receive dialysis session yesterday. Patient's potassium 5.1 today. I discussed this with Dr. Juleen China,, he recommended patient can be discharged, have dialysis tomorrow.d/w pt that , patient to be careful with his diet and to avoid high potassium containing foods with oranges, bananas,coconut water.he understand and he is aware what foods to avoid but he still eats.explained him to be compliant other wise hyperkelmia can cuase cardiac arrest, #2 ESRD on hemodialysis Tuesday, Thursday ,Saturday. #3 hyperlipidemia continue statins #4 essential hypertension: Controlled. #4 history of coronary artery  disease, PAD: History of liver cirrhosis and alcohol abuse:   Discharge Condition:    Follow UP  Follow-up Information    Follow up with LATEEF, Mora Appl, MD In 1 week.   Specialty:  Internal Medicine   Why:  Keep regular schedule  for dialysis   Contact information:   [address] [place] [address] [phone]         Discharge Instructions  and  Discharge Medications       Medication List    TAKE these medications        aspirin EC 81 MG tablet  Take 81 mg by mouth daily.     atorvastatin 40 MG tablet  Commonly known as:  LIPITOR  Take 40 mg by mouth daily.     calcium acetate 667 MG capsule  Commonly known as:  PHOSLO  Take 2,001 mg by mouth 3 (three) times daily with meals.     furosemide 40 MG tablet  Commonly known as:  LASIX  Take 1 tablet (40 mg total) by mouth daily.     gabapentin 300 MG capsule  Commonly known as:  NEURONTIN  Take 1 capsule (300 mg total) by mouth daily.     labetalol 200 MG tablet  Commonly known as:  NORMODYNE  Take 200 mg by mouth daily.     multivitamin with minerals tablet  Take 1 tablet by mouth daily.     pantoprazole 40 MG tablet  Commonly known as:  PROTONIX  Take 1 tablet (40 mg total) by mouth daily.          Diet and Activity recommendation: See Discharge Instructions above   Consults obtained -nephro   Major procedures and Radiology Reports - PLEASE review detailed and final reports for all details, in brief -      No results found.  Micro Results    Recent Results (from the past 240 hour(s))  MRSA PCR Screening     Status: None   Collection Time: 09/24/15 12:09 PM  Result Value Ref Range Status   MRSA by PCR NEGATIVE NEGATIVE Final    Comment:        The GeneXpert MRSA Assay (FDA approved for NASAL specimens only), is one component of a comprehensive MRSA colonization surveillance program. It is not intended to diagnose MRSA infection nor to guide or monitor treatment for MRSA infections.        Today   Subjective:   Stanley [name] today has no headache,no chest abdominal pain,no new weakness tingling or numbness, feels much better wants to go home  today.   Objective:   Blood pressure 142/90, pulse 63, temperature 97.8 F (36.6 C), temperature source Oral, resp. rate 16, height 6\' 3"  (1.905 m), weight 84.7 kg (186 lb 11.7 oz), SpO2 99 %.   Intake/Output Summary (Last 24 hours) at 09/25/15 1159 Last data filed at 09/25/15 0900  Gross per 24 hour  Intake    540 ml  Output   2000 ml  Net  -1460 ml    Exam Awake Alert, Oriented x 3, No new F.N deficits, Normal affect [place].AT,PERRAL Supple Neck,No JVD, No cervical lymphadenopathy appriciated.  Symmetrical Chest wall movement, Good air movement bilaterally, CTAB RRR,No Gallops,Rubs or new Murmurs, No Parasternal Heave +ve B.Sounds, Abd Soft, Non tender, No organomegaly appriciated, No rebound -guarding or rigidity. No Cyanosis, Clubbing or edema, No new Rash or bruise  Data Review   CBC w Diff: Lab  Results  Component Value Date   WBC 6.0 09/25/2015   WBC 7.0 06/10/2014   HGB 13.6 09/25/2015   HGB 12.7* 06/10/2014   HCT 41.4 09/25/2015   HCT 38.8* 06/10/2014   PLT 150 09/25/2015   PLT 201 06/10/2014   LYMPHOPCT 13 09/24/2015   LYMPHOPCT 13.6 06/10/2014   BANDSPCT 1 04/05/2015   MONOPCT 10 09/24/2015   MONOPCT 10.4 06/10/2014   EOSPCT 2 09/24/2015   EOSPCT 0.7 06/10/2014   BASOPCT 1 09/24/2015   BASOPCT 0.6 06/10/2014    CMP: Lab Results  Component Value Date   NA 133* 09/25/2015   NA 143 06/10/2014   K 5.1 09/25/2015   K 5.1 06/10/2014   CL 98* 09/25/2015   CL 102 06/10/2014   CO2 26 09/25/2015   CO2 30 06/10/2014   BUN 45* 09/25/2015   BUN 20 06/10/2014   CREATININE 6.54* 09/25/2015   CREATININE 5.42* 06/10/2014   PROT 7.7 09/24/2015   PROT 7.3 06/10/2014   ALBUMIN 3.7 09/24/2015   ALBUMIN 3.4* 06/10/2014   BILITOT 1.0 09/24/2015   BILITOT 1.9* 06/10/2014   ALKPHOS 245* 09/24/2015   ALKPHOS 156* 06/10/2014   AST 32 09/24/2015   AST 53* 06/10/2014   ALT 47 09/24/2015   ALT 32 06/10/2014  .   Total Time in preparing paper work, data evaluation  and todays exam - 56 minutes  Shamina Etheridge M.D on 09/25/2015 at 11:59 AM    Note: This dictation was prepared with Dragon dictation along with smaller phrase technology. Any transcriptional errors that result from this process are unintentional.

## 2015-09-25 NOTE — Progress Notes (Signed)
Patient had discharge orders, yet did not want to wait to get discharge papers. Patient removed his own IV and left with his personal belongings. Patient did not want to wait in room.

## 2015-09-25 NOTE — Progress Notes (Signed)
[facility]  ROUNDING NOTE   Subjective:   Hemodialysis treatment yesterday. Tolerated treatment well. UF of 2 litres. Follow up potassium of 5.1  Objective:  Vital signs in last 24 hours:  Temp:  [97.8 F (36.6 C)-98.4 F (36.9 C)] 97.8 F (36.6 C) (07/14 0529) Pulse Rate:  [63-80] 63 (07/14 0529) Resp:  [12-20] 16 (07/14 0529) BP: (130-156)/(71-100) 142/90 mmHg (07/14 0529) SpO2:  [95 %-99 %] 99 % (07/14 0529) Weight:  [84.7 kg (186 lb 11.7 oz)-86.8 kg (191 lb 5.8 oz)] 84.7 kg (186 lb 11.7 oz) (07/13 1645)  Weight change:  Filed Weights   09/24/15 0901 09/24/15 1330 09/24/15 1645  Weight: 81.647 kg (180 lb) 86.8 kg (191 lb 5.8 oz) 84.7 kg (186 lb 11.7 oz)    Intake/Output: I/O last 3 completed shifts: In: 300 [P.O.:300] Out: 2000 [Other:2000]   Intake/Output this shift:  Total I/O In: 240 [P.O.:240] Out: -   Physical Exam: General: NAD,   Head: Normocephalic, atraumatic. Moist oral mucosal membranes  Eyes: Anicteric, PERRL  Neck: Supple, trachea midline  Lungs:  Clear to auscultation  Heart: Regular rate and rhythm  Abdomen:  Soft, nontender,   Extremities: no peripheral edema.  Neurologic: Nonfocal, moving all four extremities  Skin: No lesions  Access: Right arm AVF    Basic Metabolic Panel:  Recent Labs Lab 09/24/15 0919 09/24/15 1840 09/25/15 0505  NA 135 137 133*  K 6.8* 4.6 5.1  CL 102 100* 98*  CO2 22 26 26   GLUCOSE 81 171* 108*  BUN 61* 36* 45*  CREATININE 8.19* 5.47* 6.54*  CALCIUM 9.2 8.6* 8.5*    Liver Function Tests:  Recent Labs Lab 09/24/15 0919  AST 32  ALT 47  ALKPHOS 245*  BILITOT 1.0  PROT 7.7  ALBUMIN 3.7   No results for input(s): LIPASE, AMYLASE in the last 168 hours. No results for input(s): AMMONIA in the last 168 hours.  CBC:  Recent Labs Lab 09/24/15 0919 09/24/15 1840 09/25/15 0505  WBC 7.1  --  6.0  NEUTROABS 5.2  --   --   HGB 14.2 13.4 13.6  HCT 43.2  --  41.4  MCV 91.8  --  93.1   PLT 165  --  150    Cardiac Enzymes: No results for input(s): CKTOTAL, CKMB, CKMBINDEX, TROPONINI in the last 168 hours.  BNP: Invalid input(s): POCBNP  CBG: No results for input(s): GLUCAP in the last 168 hours.  Microbiology: Results for orders placed or performed during the hospital encounter of 09/24/15  MRSA PCR Screening     Status: None   Collection Time: 09/24/15 12:09 PM  Result Value Ref Range Status   MRSA by PCR NEGATIVE NEGATIVE Final    Comment:        The GeneXpert MRSA Assay (FDA approved for NASAL specimens only), is one component of a comprehensive MRSA colonization surveillance program. It is not intended to diagnose MRSA infection nor to guide or monitor treatment for MRSA infections.     Coagulation Studies: No results for input(s): LABPROT, INR in the last 72 hours.  Urinalysis: No results for input(s): COLORURINE, LABSPEC, PHURINE, GLUCOSEU, HGBUR, BILIRUBINUR, KETONESUR, PROTEINUR, UROBILINOGEN, NITRITE, LEUKOCYTESUR in the last 72 hours.  Invalid input(s): APPERANCEUR    Imaging: No results found.   Medications:     . atorvastatin  40 mg Oral q1800  . calcium acetate  2,001 mg Oral TID WC  . furosemide  40 mg Oral Daily  . gabapentin  300 mg Oral Daily  . labetalol  200 mg Oral Daily  . multivitamin with minerals  1 tablet Oral Daily  . pantoprazole  40 mg Oral Daily  . sodium chloride flush  3 mL Intravenous Q12H   acetaminophen **OR** acetaminophen, traMADol  Assessment/ Plan:  Mr. Kharter Rexroth is a 55 y.o. black male with coronary artery disease, hypertension, peripheral vascular disease, hyperlipidemia, anemia  TTS [facility] [place].   1. End Stage Renal Disease with hyperkalemia:  Hemodialysis treatment yesterday with a 2K bath.  - low potassium diet - Continue TTS schedule. May need an outpatient 1K bath.   2. Hypertension: elevated  -  Furosemide and labetolol.   3. Secondary Hyperparathyroidism: PTH  379, phosphorus 4.1, calcium 10.  - continue calcium acetate  4. Anemia of chronic kidney disease: hemoglobin at normal range.  - not currently on epo.    LOS: [place], Mirella Gueye 7/14/201710:02 AM

## 2015-09-26 DIAGNOSIS — N2581 Secondary hyperparathyroidism of renal origin: Secondary | ICD-10-CM | POA: Diagnosis not present

## 2015-09-26 DIAGNOSIS — D509 Iron deficiency anemia, unspecified: Secondary | ICD-10-CM | POA: Diagnosis not present

## 2015-09-26 DIAGNOSIS — Z992 Dependence on renal dialysis: Secondary | ICD-10-CM | POA: Diagnosis not present

## 2015-09-26 DIAGNOSIS — N186 End stage renal disease: Secondary | ICD-10-CM | POA: Diagnosis not present

## 2015-09-29 DIAGNOSIS — Z992 Dependence on renal dialysis: Secondary | ICD-10-CM | POA: Diagnosis not present

## 2015-09-29 DIAGNOSIS — D509 Iron deficiency anemia, unspecified: Secondary | ICD-10-CM | POA: Diagnosis not present

## 2015-09-29 DIAGNOSIS — N186 End stage renal disease: Secondary | ICD-10-CM | POA: Diagnosis not present

## 2015-09-29 DIAGNOSIS — N2581 Secondary hyperparathyroidism of renal origin: Secondary | ICD-10-CM | POA: Diagnosis not present

## 2015-10-01 DIAGNOSIS — Z992 Dependence on renal dialysis: Secondary | ICD-10-CM | POA: Diagnosis not present

## 2015-10-01 DIAGNOSIS — N2581 Secondary hyperparathyroidism of renal origin: Secondary | ICD-10-CM | POA: Diagnosis not present

## 2015-10-01 DIAGNOSIS — D509 Iron deficiency anemia, unspecified: Secondary | ICD-10-CM | POA: Diagnosis not present

## 2015-10-01 DIAGNOSIS — N186 End stage renal disease: Secondary | ICD-10-CM | POA: Diagnosis not present

## 2015-10-03 DIAGNOSIS — N2581 Secondary hyperparathyroidism of renal origin: Secondary | ICD-10-CM | POA: Diagnosis not present

## 2015-10-03 DIAGNOSIS — D509 Iron deficiency anemia, unspecified: Secondary | ICD-10-CM | POA: Diagnosis not present

## 2015-10-03 DIAGNOSIS — Z992 Dependence on renal dialysis: Secondary | ICD-10-CM | POA: Diagnosis not present

## 2015-10-03 DIAGNOSIS — N186 End stage renal disease: Secondary | ICD-10-CM | POA: Diagnosis not present

## 2015-10-08 DIAGNOSIS — Z992 Dependence on renal dialysis: Secondary | ICD-10-CM | POA: Diagnosis not present

## 2015-10-08 DIAGNOSIS — N2581 Secondary hyperparathyroidism of renal origin: Secondary | ICD-10-CM | POA: Diagnosis not present

## 2015-10-08 DIAGNOSIS — D631 Anemia in chronic kidney disease: Secondary | ICD-10-CM | POA: Diagnosis not present

## 2015-10-08 DIAGNOSIS — N186 End stage renal disease: Secondary | ICD-10-CM | POA: Diagnosis not present

## 2015-10-10 DIAGNOSIS — D631 Anemia in chronic kidney disease: Secondary | ICD-10-CM | POA: Diagnosis not present

## 2015-10-10 DIAGNOSIS — N186 End stage renal disease: Secondary | ICD-10-CM | POA: Diagnosis not present

## 2015-10-10 DIAGNOSIS — N2581 Secondary hyperparathyroidism of renal origin: Secondary | ICD-10-CM | POA: Diagnosis not present

## 2015-10-10 DIAGNOSIS — Z992 Dependence on renal dialysis: Secondary | ICD-10-CM | POA: Diagnosis not present

## 2015-10-12 DIAGNOSIS — N186 End stage renal disease: Secondary | ICD-10-CM | POA: Diagnosis not present

## 2015-10-12 DIAGNOSIS — Z992 Dependence on renal dialysis: Secondary | ICD-10-CM | POA: Diagnosis not present

## 2015-10-12 DIAGNOSIS — K7469 Other cirrhosis of liver: Secondary | ICD-10-CM | POA: Diagnosis not present

## 2015-10-12 DIAGNOSIS — I10 Essential (primary) hypertension: Secondary | ICD-10-CM | POA: Diagnosis not present

## 2015-10-12 DIAGNOSIS — R188 Other ascites: Secondary | ICD-10-CM | POA: Diagnosis not present

## 2015-10-13 DIAGNOSIS — N186 End stage renal disease: Secondary | ICD-10-CM | POA: Diagnosis not present

## 2015-10-13 DIAGNOSIS — D509 Iron deficiency anemia, unspecified: Secondary | ICD-10-CM | POA: Diagnosis not present

## 2015-10-13 DIAGNOSIS — N2581 Secondary hyperparathyroidism of renal origin: Secondary | ICD-10-CM | POA: Diagnosis not present

## 2015-10-13 DIAGNOSIS — Z992 Dependence on renal dialysis: Secondary | ICD-10-CM | POA: Diagnosis not present

## 2015-10-17 DIAGNOSIS — D509 Iron deficiency anemia, unspecified: Secondary | ICD-10-CM | POA: Diagnosis not present

## 2015-10-17 DIAGNOSIS — N186 End stage renal disease: Secondary | ICD-10-CM | POA: Diagnosis not present

## 2015-10-17 DIAGNOSIS — N2581 Secondary hyperparathyroidism of renal origin: Secondary | ICD-10-CM | POA: Diagnosis not present

## 2015-10-17 DIAGNOSIS — Z992 Dependence on renal dialysis: Secondary | ICD-10-CM | POA: Diagnosis not present

## 2015-10-20 DIAGNOSIS — D509 Iron deficiency anemia, unspecified: Secondary | ICD-10-CM | POA: Diagnosis not present

## 2015-10-20 DIAGNOSIS — Z992 Dependence on renal dialysis: Secondary | ICD-10-CM | POA: Diagnosis not present

## 2015-10-20 DIAGNOSIS — N2581 Secondary hyperparathyroidism of renal origin: Secondary | ICD-10-CM | POA: Diagnosis not present

## 2015-10-20 DIAGNOSIS — N186 End stage renal disease: Secondary | ICD-10-CM | POA: Diagnosis not present

## 2015-10-22 DIAGNOSIS — Z992 Dependence on renal dialysis: Secondary | ICD-10-CM | POA: Diagnosis not present

## 2015-10-22 DIAGNOSIS — N2581 Secondary hyperparathyroidism of renal origin: Secondary | ICD-10-CM | POA: Diagnosis not present

## 2015-10-22 DIAGNOSIS — D509 Iron deficiency anemia, unspecified: Secondary | ICD-10-CM | POA: Diagnosis not present

## 2015-10-22 DIAGNOSIS — N186 End stage renal disease: Secondary | ICD-10-CM | POA: Diagnosis not present

## 2015-10-24 DIAGNOSIS — Z992 Dependence on renal dialysis: Secondary | ICD-10-CM | POA: Diagnosis not present

## 2015-10-24 DIAGNOSIS — N186 End stage renal disease: Secondary | ICD-10-CM | POA: Diagnosis not present

## 2015-10-24 DIAGNOSIS — N2581 Secondary hyperparathyroidism of renal origin: Secondary | ICD-10-CM | POA: Diagnosis not present

## 2015-10-24 DIAGNOSIS — D509 Iron deficiency anemia, unspecified: Secondary | ICD-10-CM | POA: Diagnosis not present

## 2015-10-26 DIAGNOSIS — K828 Other specified diseases of gallbladder: Secondary | ICD-10-CM | POA: Diagnosis not present

## 2015-10-26 DIAGNOSIS — R188 Other ascites: Secondary | ICD-10-CM | POA: Diagnosis not present

## 2015-10-26 DIAGNOSIS — I2510 Atherosclerotic heart disease of native coronary artery without angina pectoris: Secondary | ICD-10-CM | POA: Diagnosis not present

## 2015-10-26 DIAGNOSIS — R161 Splenomegaly, not elsewhere classified: Secondary | ICD-10-CM | POA: Diagnosis not present

## 2015-10-26 DIAGNOSIS — N189 Chronic kidney disease, unspecified: Secondary | ICD-10-CM | POA: Diagnosis not present

## 2015-10-26 DIAGNOSIS — R162 Hepatomegaly with splenomegaly, not elsewhere classified: Secondary | ICD-10-CM | POA: Diagnosis not present

## 2015-10-26 DIAGNOSIS — I10 Essential (primary) hypertension: Secondary | ICD-10-CM | POA: Diagnosis not present

## 2015-10-26 DIAGNOSIS — N281 Cyst of kidney, acquired: Secondary | ICD-10-CM | POA: Diagnosis not present

## 2015-10-27 DIAGNOSIS — N2581 Secondary hyperparathyroidism of renal origin: Secondary | ICD-10-CM | POA: Diagnosis not present

## 2015-10-27 DIAGNOSIS — N186 End stage renal disease: Secondary | ICD-10-CM | POA: Diagnosis not present

## 2015-10-27 DIAGNOSIS — D509 Iron deficiency anemia, unspecified: Secondary | ICD-10-CM | POA: Diagnosis not present

## 2015-10-27 DIAGNOSIS — Z992 Dependence on renal dialysis: Secondary | ICD-10-CM | POA: Diagnosis not present

## 2015-10-29 DIAGNOSIS — Z992 Dependence on renal dialysis: Secondary | ICD-10-CM | POA: Diagnosis not present

## 2015-10-29 DIAGNOSIS — N2581 Secondary hyperparathyroidism of renal origin: Secondary | ICD-10-CM | POA: Diagnosis not present

## 2015-10-29 DIAGNOSIS — N186 End stage renal disease: Secondary | ICD-10-CM | POA: Diagnosis not present

## 2015-10-29 DIAGNOSIS — D509 Iron deficiency anemia, unspecified: Secondary | ICD-10-CM | POA: Diagnosis not present

## 2015-10-31 DIAGNOSIS — D509 Iron deficiency anemia, unspecified: Secondary | ICD-10-CM | POA: Diagnosis not present

## 2015-10-31 DIAGNOSIS — Z992 Dependence on renal dialysis: Secondary | ICD-10-CM | POA: Diagnosis not present

## 2015-10-31 DIAGNOSIS — N2581 Secondary hyperparathyroidism of renal origin: Secondary | ICD-10-CM | POA: Diagnosis not present

## 2015-10-31 DIAGNOSIS — N186 End stage renal disease: Secondary | ICD-10-CM | POA: Diagnosis not present

## 2015-11-03 DIAGNOSIS — Z992 Dependence on renal dialysis: Secondary | ICD-10-CM | POA: Diagnosis not present

## 2015-11-03 DIAGNOSIS — N186 End stage renal disease: Secondary | ICD-10-CM | POA: Diagnosis not present

## 2015-11-03 DIAGNOSIS — N2581 Secondary hyperparathyroidism of renal origin: Secondary | ICD-10-CM | POA: Diagnosis not present

## 2015-11-03 DIAGNOSIS — D509 Iron deficiency anemia, unspecified: Secondary | ICD-10-CM | POA: Diagnosis not present

## 2015-11-05 DIAGNOSIS — D509 Iron deficiency anemia, unspecified: Secondary | ICD-10-CM | POA: Diagnosis not present

## 2015-11-05 DIAGNOSIS — Z992 Dependence on renal dialysis: Secondary | ICD-10-CM | POA: Diagnosis not present

## 2015-11-05 DIAGNOSIS — N186 End stage renal disease: Secondary | ICD-10-CM | POA: Diagnosis not present

## 2015-11-05 DIAGNOSIS — N2581 Secondary hyperparathyroidism of renal origin: Secondary | ICD-10-CM | POA: Diagnosis not present

## 2015-11-10 DIAGNOSIS — N2581 Secondary hyperparathyroidism of renal origin: Secondary | ICD-10-CM | POA: Diagnosis not present

## 2015-11-10 DIAGNOSIS — N186 End stage renal disease: Secondary | ICD-10-CM | POA: Diagnosis not present

## 2015-11-10 DIAGNOSIS — D509 Iron deficiency anemia, unspecified: Secondary | ICD-10-CM | POA: Diagnosis not present

## 2015-11-10 DIAGNOSIS — Z992 Dependence on renal dialysis: Secondary | ICD-10-CM | POA: Diagnosis not present

## 2015-11-12 DIAGNOSIS — N186 End stage renal disease: Secondary | ICD-10-CM | POA: Diagnosis not present

## 2015-11-12 DIAGNOSIS — N2581 Secondary hyperparathyroidism of renal origin: Secondary | ICD-10-CM | POA: Diagnosis not present

## 2015-11-12 DIAGNOSIS — D509 Iron deficiency anemia, unspecified: Secondary | ICD-10-CM | POA: Diagnosis not present

## 2015-11-12 DIAGNOSIS — Z992 Dependence on renal dialysis: Secondary | ICD-10-CM | POA: Diagnosis not present

## 2015-11-14 DIAGNOSIS — Z992 Dependence on renal dialysis: Secondary | ICD-10-CM | POA: Diagnosis not present

## 2015-11-14 DIAGNOSIS — N2581 Secondary hyperparathyroidism of renal origin: Secondary | ICD-10-CM | POA: Diagnosis not present

## 2015-11-14 DIAGNOSIS — Z23 Encounter for immunization: Secondary | ICD-10-CM | POA: Diagnosis not present

## 2015-11-14 DIAGNOSIS — D509 Iron deficiency anemia, unspecified: Secondary | ICD-10-CM | POA: Diagnosis not present

## 2015-11-14 DIAGNOSIS — N186 End stage renal disease: Secondary | ICD-10-CM | POA: Diagnosis not present

## 2015-11-17 DIAGNOSIS — D509 Iron deficiency anemia, unspecified: Secondary | ICD-10-CM | POA: Diagnosis not present

## 2015-11-17 DIAGNOSIS — Z23 Encounter for immunization: Secondary | ICD-10-CM | POA: Diagnosis not present

## 2015-11-17 DIAGNOSIS — N186 End stage renal disease: Secondary | ICD-10-CM | POA: Diagnosis not present

## 2015-11-17 DIAGNOSIS — Z992 Dependence on renal dialysis: Secondary | ICD-10-CM | POA: Diagnosis not present

## 2015-11-17 DIAGNOSIS — N2581 Secondary hyperparathyroidism of renal origin: Secondary | ICD-10-CM | POA: Diagnosis not present

## 2015-11-19 DIAGNOSIS — N2581 Secondary hyperparathyroidism of renal origin: Secondary | ICD-10-CM | POA: Diagnosis not present

## 2015-11-19 DIAGNOSIS — Z23 Encounter for immunization: Secondary | ICD-10-CM | POA: Diagnosis not present

## 2015-11-19 DIAGNOSIS — N186 End stage renal disease: Secondary | ICD-10-CM | POA: Diagnosis not present

## 2015-11-19 DIAGNOSIS — D509 Iron deficiency anemia, unspecified: Secondary | ICD-10-CM | POA: Diagnosis not present

## 2015-11-19 DIAGNOSIS — Z992 Dependence on renal dialysis: Secondary | ICD-10-CM | POA: Diagnosis not present

## 2015-11-21 DIAGNOSIS — D509 Iron deficiency anemia, unspecified: Secondary | ICD-10-CM | POA: Diagnosis not present

## 2015-11-21 DIAGNOSIS — N186 End stage renal disease: Secondary | ICD-10-CM | POA: Diagnosis not present

## 2015-11-21 DIAGNOSIS — Z23 Encounter for immunization: Secondary | ICD-10-CM | POA: Diagnosis not present

## 2015-11-21 DIAGNOSIS — N2581 Secondary hyperparathyroidism of renal origin: Secondary | ICD-10-CM | POA: Diagnosis not present

## 2015-11-21 DIAGNOSIS — Z992 Dependence on renal dialysis: Secondary | ICD-10-CM | POA: Diagnosis not present

## 2015-11-23 DIAGNOSIS — D509 Iron deficiency anemia, unspecified: Secondary | ICD-10-CM | POA: Diagnosis not present

## 2015-11-23 DIAGNOSIS — N186 End stage renal disease: Secondary | ICD-10-CM | POA: Diagnosis not present

## 2015-11-23 DIAGNOSIS — N2581 Secondary hyperparathyroidism of renal origin: Secondary | ICD-10-CM | POA: Diagnosis not present

## 2015-11-23 DIAGNOSIS — Z23 Encounter for immunization: Secondary | ICD-10-CM | POA: Diagnosis not present

## 2015-11-23 DIAGNOSIS — Z992 Dependence on renal dialysis: Secondary | ICD-10-CM | POA: Diagnosis not present

## 2015-11-26 DIAGNOSIS — N186 End stage renal disease: Secondary | ICD-10-CM | POA: Diagnosis not present

## 2015-11-26 DIAGNOSIS — D509 Iron deficiency anemia, unspecified: Secondary | ICD-10-CM | POA: Diagnosis not present

## 2015-11-26 DIAGNOSIS — Z23 Encounter for immunization: Secondary | ICD-10-CM | POA: Diagnosis not present

## 2015-11-26 DIAGNOSIS — Z992 Dependence on renal dialysis: Secondary | ICD-10-CM | POA: Diagnosis not present

## 2015-11-26 DIAGNOSIS — N2581 Secondary hyperparathyroidism of renal origin: Secondary | ICD-10-CM | POA: Diagnosis not present

## 2015-11-28 DIAGNOSIS — N2581 Secondary hyperparathyroidism of renal origin: Secondary | ICD-10-CM | POA: Diagnosis not present

## 2015-11-28 DIAGNOSIS — D509 Iron deficiency anemia, unspecified: Secondary | ICD-10-CM | POA: Diagnosis not present

## 2015-11-28 DIAGNOSIS — Z23 Encounter for immunization: Secondary | ICD-10-CM | POA: Diagnosis not present

## 2015-11-28 DIAGNOSIS — N186 End stage renal disease: Secondary | ICD-10-CM | POA: Diagnosis not present

## 2015-11-28 DIAGNOSIS — Z992 Dependence on renal dialysis: Secondary | ICD-10-CM | POA: Diagnosis not present

## 2015-12-01 DIAGNOSIS — Z992 Dependence on renal dialysis: Secondary | ICD-10-CM | POA: Diagnosis not present

## 2015-12-01 DIAGNOSIS — N2581 Secondary hyperparathyroidism of renal origin: Secondary | ICD-10-CM | POA: Diagnosis not present

## 2015-12-01 DIAGNOSIS — Z23 Encounter for immunization: Secondary | ICD-10-CM | POA: Diagnosis not present

## 2015-12-01 DIAGNOSIS — N186 End stage renal disease: Secondary | ICD-10-CM | POA: Diagnosis not present

## 2015-12-01 DIAGNOSIS — D509 Iron deficiency anemia, unspecified: Secondary | ICD-10-CM | POA: Diagnosis not present

## 2015-12-03 DIAGNOSIS — N186 End stage renal disease: Secondary | ICD-10-CM | POA: Diagnosis not present

## 2015-12-03 DIAGNOSIS — Z23 Encounter for immunization: Secondary | ICD-10-CM | POA: Diagnosis not present

## 2015-12-03 DIAGNOSIS — D509 Iron deficiency anemia, unspecified: Secondary | ICD-10-CM | POA: Diagnosis not present

## 2015-12-03 DIAGNOSIS — N2581 Secondary hyperparathyroidism of renal origin: Secondary | ICD-10-CM | POA: Diagnosis not present

## 2015-12-03 DIAGNOSIS — Z992 Dependence on renal dialysis: Secondary | ICD-10-CM | POA: Diagnosis not present

## 2015-12-05 DIAGNOSIS — Z23 Encounter for immunization: Secondary | ICD-10-CM | POA: Diagnosis not present

## 2015-12-05 DIAGNOSIS — N186 End stage renal disease: Secondary | ICD-10-CM | POA: Diagnosis not present

## 2015-12-05 DIAGNOSIS — N2581 Secondary hyperparathyroidism of renal origin: Secondary | ICD-10-CM | POA: Diagnosis not present

## 2015-12-05 DIAGNOSIS — Z992 Dependence on renal dialysis: Secondary | ICD-10-CM | POA: Diagnosis not present

## 2015-12-05 DIAGNOSIS — D509 Iron deficiency anemia, unspecified: Secondary | ICD-10-CM | POA: Diagnosis not present

## 2015-12-08 DIAGNOSIS — Z23 Encounter for immunization: Secondary | ICD-10-CM | POA: Diagnosis not present

## 2015-12-08 DIAGNOSIS — Z992 Dependence on renal dialysis: Secondary | ICD-10-CM | POA: Diagnosis not present

## 2015-12-08 DIAGNOSIS — D509 Iron deficiency anemia, unspecified: Secondary | ICD-10-CM | POA: Diagnosis not present

## 2015-12-08 DIAGNOSIS — N186 End stage renal disease: Secondary | ICD-10-CM | POA: Diagnosis not present

## 2015-12-08 DIAGNOSIS — N2581 Secondary hyperparathyroidism of renal origin: Secondary | ICD-10-CM | POA: Diagnosis not present

## 2015-12-12 DIAGNOSIS — D509 Iron deficiency anemia, unspecified: Secondary | ICD-10-CM | POA: Diagnosis not present

## 2015-12-12 DIAGNOSIS — N2581 Secondary hyperparathyroidism of renal origin: Secondary | ICD-10-CM | POA: Diagnosis not present

## 2015-12-12 DIAGNOSIS — Z992 Dependence on renal dialysis: Secondary | ICD-10-CM | POA: Diagnosis not present

## 2015-12-12 DIAGNOSIS — N186 End stage renal disease: Secondary | ICD-10-CM | POA: Diagnosis not present

## 2015-12-12 DIAGNOSIS — Z23 Encounter for immunization: Secondary | ICD-10-CM | POA: Diagnosis not present

## 2015-12-15 DIAGNOSIS — N186 End stage renal disease: Secondary | ICD-10-CM | POA: Diagnosis not present

## 2015-12-15 DIAGNOSIS — Z992 Dependence on renal dialysis: Secondary | ICD-10-CM | POA: Diagnosis not present

## 2015-12-15 DIAGNOSIS — N2581 Secondary hyperparathyroidism of renal origin: Secondary | ICD-10-CM | POA: Diagnosis not present

## 2015-12-17 DIAGNOSIS — N2581 Secondary hyperparathyroidism of renal origin: Secondary | ICD-10-CM | POA: Diagnosis not present

## 2015-12-17 DIAGNOSIS — Z992 Dependence on renal dialysis: Secondary | ICD-10-CM | POA: Diagnosis not present

## 2015-12-17 DIAGNOSIS — N186 End stage renal disease: Secondary | ICD-10-CM | POA: Diagnosis not present

## 2015-12-19 DIAGNOSIS — N186 End stage renal disease: Secondary | ICD-10-CM | POA: Diagnosis not present

## 2015-12-19 DIAGNOSIS — N2581 Secondary hyperparathyroidism of renal origin: Secondary | ICD-10-CM | POA: Diagnosis not present

## 2015-12-19 DIAGNOSIS — Z992 Dependence on renal dialysis: Secondary | ICD-10-CM | POA: Diagnosis not present

## 2015-12-22 DIAGNOSIS — N186 End stage renal disease: Secondary | ICD-10-CM | POA: Diagnosis not present

## 2015-12-22 DIAGNOSIS — Z992 Dependence on renal dialysis: Secondary | ICD-10-CM | POA: Diagnosis not present

## 2015-12-22 DIAGNOSIS — N2581 Secondary hyperparathyroidism of renal origin: Secondary | ICD-10-CM | POA: Diagnosis not present

## 2015-12-24 DIAGNOSIS — N186 End stage renal disease: Secondary | ICD-10-CM | POA: Diagnosis not present

## 2015-12-24 DIAGNOSIS — Z992 Dependence on renal dialysis: Secondary | ICD-10-CM | POA: Diagnosis not present

## 2015-12-24 DIAGNOSIS — N2581 Secondary hyperparathyroidism of renal origin: Secondary | ICD-10-CM | POA: Diagnosis not present

## 2015-12-29 DIAGNOSIS — N2581 Secondary hyperparathyroidism of renal origin: Secondary | ICD-10-CM | POA: Diagnosis not present

## 2015-12-29 DIAGNOSIS — N186 End stage renal disease: Secondary | ICD-10-CM | POA: Diagnosis not present

## 2015-12-29 DIAGNOSIS — Z992 Dependence on renal dialysis: Secondary | ICD-10-CM | POA: Diagnosis not present

## 2016-01-02 DIAGNOSIS — Z992 Dependence on renal dialysis: Secondary | ICD-10-CM | POA: Diagnosis not present

## 2016-01-02 DIAGNOSIS — N2581 Secondary hyperparathyroidism of renal origin: Secondary | ICD-10-CM | POA: Diagnosis not present

## 2016-01-02 DIAGNOSIS — N186 End stage renal disease: Secondary | ICD-10-CM | POA: Diagnosis not present

## 2016-01-05 DIAGNOSIS — Z992 Dependence on renal dialysis: Secondary | ICD-10-CM | POA: Diagnosis not present

## 2016-01-05 DIAGNOSIS — N186 End stage renal disease: Secondary | ICD-10-CM | POA: Diagnosis not present

## 2016-01-05 DIAGNOSIS — N2581 Secondary hyperparathyroidism of renal origin: Secondary | ICD-10-CM | POA: Diagnosis not present

## 2016-01-07 DIAGNOSIS — N2581 Secondary hyperparathyroidism of renal origin: Secondary | ICD-10-CM | POA: Diagnosis not present

## 2016-01-07 DIAGNOSIS — Z992 Dependence on renal dialysis: Secondary | ICD-10-CM | POA: Diagnosis not present

## 2016-01-07 DIAGNOSIS — N186 End stage renal disease: Secondary | ICD-10-CM | POA: Diagnosis not present

## 2016-01-09 DIAGNOSIS — Z992 Dependence on renal dialysis: Secondary | ICD-10-CM | POA: Diagnosis not present

## 2016-01-09 DIAGNOSIS — N2581 Secondary hyperparathyroidism of renal origin: Secondary | ICD-10-CM | POA: Diagnosis not present

## 2016-01-09 DIAGNOSIS — N186 End stage renal disease: Secondary | ICD-10-CM | POA: Diagnosis not present

## 2016-01-12 DIAGNOSIS — N2581 Secondary hyperparathyroidism of renal origin: Secondary | ICD-10-CM | POA: Diagnosis not present

## 2016-01-12 DIAGNOSIS — N186 End stage renal disease: Secondary | ICD-10-CM | POA: Diagnosis not present

## 2016-01-12 DIAGNOSIS — Z992 Dependence on renal dialysis: Secondary | ICD-10-CM | POA: Diagnosis not present

## 2016-01-14 DIAGNOSIS — N186 End stage renal disease: Secondary | ICD-10-CM | POA: Diagnosis not present

## 2016-01-14 DIAGNOSIS — N2581 Secondary hyperparathyroidism of renal origin: Secondary | ICD-10-CM | POA: Diagnosis not present

## 2016-01-14 DIAGNOSIS — Z992 Dependence on renal dialysis: Secondary | ICD-10-CM | POA: Diagnosis not present

## 2016-01-16 DIAGNOSIS — N186 End stage renal disease: Secondary | ICD-10-CM | POA: Diagnosis not present

## 2016-01-16 DIAGNOSIS — N2581 Secondary hyperparathyroidism of renal origin: Secondary | ICD-10-CM | POA: Diagnosis not present

## 2016-01-16 DIAGNOSIS — Z992 Dependence on renal dialysis: Secondary | ICD-10-CM | POA: Diagnosis not present

## 2016-01-19 ENCOUNTER — Encounter: Payer: Self-pay | Admitting: Emergency Medicine

## 2016-01-19 ENCOUNTER — Other Ambulatory Visit: Payer: Self-pay

## 2016-01-19 ENCOUNTER — Observation Stay: Payer: Medicare Other

## 2016-01-19 ENCOUNTER — Observation Stay
Admission: EM | Admit: 2016-01-19 | Discharge: 2016-01-22 | Disposition: A | Discharge status: Home / Self Care | Payer: Medicare Other | Attending: Internal Medicine | Admitting: Internal Medicine

## 2016-01-19 DIAGNOSIS — M79673 Pain in unspecified foot: Secondary | ICD-10-CM | POA: Insufficient documentation

## 2016-01-19 DIAGNOSIS — Z992 Dependence on renal dialysis: Secondary | ICD-10-CM | POA: Diagnosis not present

## 2016-01-19 DIAGNOSIS — R059 Cough, unspecified: Secondary | ICD-10-CM

## 2016-01-19 DIAGNOSIS — I10 Essential (primary) hypertension: Secondary | ICD-10-CM | POA: Diagnosis not present

## 2016-01-19 DIAGNOSIS — N186 End stage renal disease: Secondary | ICD-10-CM | POA: Diagnosis not present

## 2016-01-19 DIAGNOSIS — N2581 Secondary hyperparathyroidism of renal origin: Secondary | ICD-10-CM | POA: Insufficient documentation

## 2016-01-19 DIAGNOSIS — K2901 Acute gastritis with bleeding: Secondary | ICD-10-CM | POA: Diagnosis not present

## 2016-01-19 DIAGNOSIS — Z955 Presence of coronary angioplasty implant and graft: Secondary | ICD-10-CM | POA: Insufficient documentation

## 2016-01-19 DIAGNOSIS — K210 Gastro-esophageal reflux disease with esophagitis: Secondary | ICD-10-CM | POA: Diagnosis not present

## 2016-01-19 DIAGNOSIS — G8929 Other chronic pain: Secondary | ICD-10-CM | POA: Insufficient documentation

## 2016-01-19 DIAGNOSIS — K922 Gastrointestinal hemorrhage, unspecified: Secondary | ICD-10-CM

## 2016-01-19 DIAGNOSIS — E1142 Type 2 diabetes mellitus with diabetic polyneuropathy: Secondary | ICD-10-CM | POA: Diagnosis not present

## 2016-01-19 DIAGNOSIS — E875 Hyperkalemia: Secondary | ICD-10-CM | POA: Diagnosis not present

## 2016-01-19 DIAGNOSIS — E1122 Type 2 diabetes mellitus with diabetic chronic kidney disease: Secondary | ICD-10-CM | POA: Diagnosis not present

## 2016-01-19 DIAGNOSIS — K7031 Alcoholic cirrhosis of liver with ascites: Secondary | ICD-10-CM | POA: Diagnosis not present

## 2016-01-19 DIAGNOSIS — D631 Anemia in chronic kidney disease: Secondary | ICD-10-CM | POA: Insufficient documentation

## 2016-01-19 DIAGNOSIS — R140 Abdominal distension (gaseous): Secondary | ICD-10-CM | POA: Diagnosis not present

## 2016-01-19 DIAGNOSIS — K2950 Unspecified chronic gastritis without bleeding: Secondary | ICD-10-CM | POA: Diagnosis not present

## 2016-01-19 DIAGNOSIS — I2510 Atherosclerotic heart disease of native coronary artery without angina pectoris: Secondary | ICD-10-CM | POA: Diagnosis not present

## 2016-01-19 DIAGNOSIS — E1151 Type 2 diabetes mellitus with diabetic peripheral angiopathy without gangrene: Secondary | ICD-10-CM | POA: Insufficient documentation

## 2016-01-19 DIAGNOSIS — F17210 Nicotine dependence, cigarettes, uncomplicated: Secondary | ICD-10-CM | POA: Diagnosis not present

## 2016-01-19 DIAGNOSIS — Z716 Tobacco abuse counseling: Secondary | ICD-10-CM | POA: Diagnosis not present

## 2016-01-19 DIAGNOSIS — E785 Hyperlipidemia, unspecified: Secondary | ICD-10-CM | POA: Diagnosis not present

## 2016-01-19 DIAGNOSIS — Z7982 Long term (current) use of aspirin: Secondary | ICD-10-CM | POA: Insufficient documentation

## 2016-01-19 DIAGNOSIS — R05 Cough: Secondary | ICD-10-CM

## 2016-01-19 DIAGNOSIS — I120 Hypertensive chronic kidney disease with stage 5 chronic kidney disease or end stage renal disease: Secondary | ICD-10-CM | POA: Diagnosis not present

## 2016-01-19 LAB — COMPREHENSIVE METABOLIC PANEL
ALT: 32 U/L (ref 17–63)
AST: 30 U/L (ref 15–41)
Albumin: 3.7 g/dL (ref 3.5–5.0)
Alkaline Phosphatase: 184 U/L — ABNORMAL HIGH (ref 38–126)
Anion gap: 10 (ref 5–15)
BUN: 34 mg/dL — ABNORMAL HIGH (ref 6–20)
CHLORIDE: 101 mmol/L (ref 101–111)
CO2: 26 mmol/L (ref 22–32)
Calcium: 8.7 mg/dL — ABNORMAL LOW (ref 8.9–10.3)
Creatinine, Ser: 7.47 mg/dL — ABNORMAL HIGH (ref 0.61–1.24)
GFR calc non Af Amer: 7 mL/min — ABNORMAL LOW (ref >60)
GFR, EST AFRICAN AMERICAN: 8 mL/min — AB (ref >60)
Glucose, Bld: 151 mg/dL — ABNORMAL HIGH (ref 65–99)
POTASSIUM: 3.9 mmol/L (ref 3.5–5.1)
Sodium: 137 mmol/L (ref 135–145)
TOTAL PROTEIN: 7.3 g/dL (ref 6.5–8.1)
Total Bilirubin: 1.4 mg/dL — ABNORMAL HIGH (ref 0.3–1.2)

## 2016-01-19 LAB — CBC WITH DIFFERENTIAL/PLATELET
Basophils Absolute: 0.1 10*3/uL (ref 0–0.1)
Basophils Relative: 1 %
EOS PCT: 2 %
Eosinophils Absolute: 0.2 10*3/uL (ref 0–0.7)
HCT: 40.8 % (ref 40.0–52.0)
Hemoglobin: 13.7 g/dL (ref 13.0–18.0)
LYMPHS ABS: 0.7 10*3/uL — AB (ref 1.0–3.6)
LYMPHS PCT: 7 %
MCH: 31.1 pg (ref 26.0–34.0)
MCHC: 33.5 g/dL (ref 32.0–36.0)
MCV: 92.8 fL (ref 80.0–100.0)
MONO ABS: 0.7 10*3/uL (ref 0.2–1.0)
MONOS PCT: 8 %
Neutro Abs: 7.5 10*3/uL — ABNORMAL HIGH (ref 1.4–6.5)
Neutrophils Relative %: 82 %
PLATELETS: 190 10*3/uL (ref 150–440)
RBC: 4.4 MIL/uL (ref 4.40–5.90)
RDW: 17 % — AB (ref 11.5–14.5)
WBC: 9.2 10*3/uL (ref 3.8–10.6)

## 2016-01-19 LAB — TYPE AND SCREEN
ABO/RH(D): A POS
Antibody Screen: NEGATIVE

## 2016-01-19 LAB — PROTIME-INR
INR: 1.24
PROTHROMBIN TIME: 15.7 s — AB (ref 11.4–15.2)

## 2016-01-19 LAB — LIPASE, BLOOD: LIPASE: 19 U/L (ref 11–51)

## 2016-01-19 LAB — APTT: APTT: 31 s (ref 24–36)

## 2016-01-19 MED ORDER — GI COCKTAIL ~~LOC~~
30.0000 mL | Freq: Once | ORAL | Status: AC
  Administered 2016-01-19: 30 mL via ORAL
  Filled 2016-01-19: qty 30

## 2016-01-19 MED ORDER — CINACALCET HCL 30 MG PO TABS
30.0000 mg | ORAL_TABLET | Freq: Every day | ORAL | Status: DC
  Administered 2016-01-20: 30 mg via ORAL
  Filled 2016-01-19 (×2): qty 1

## 2016-01-19 MED ORDER — CALCIUM ACETATE 667 MG PO CAPS
2001.0000 mg | ORAL_CAPSULE | Freq: Three times a day (TID) | ORAL | Status: DC
  Administered 2016-01-20 – 2016-01-22 (×6): 2001 mg via ORAL
  Filled 2016-01-19 (×13): qty 3

## 2016-01-19 MED ORDER — PANTOPRAZOLE SODIUM 40 MG PO TBEC
40.0000 mg | DELAYED_RELEASE_TABLET | Freq: Two times a day (BID) | ORAL | Status: DC
  Administered 2016-01-19 – 2016-01-22 (×5): 40 mg via ORAL
  Filled 2016-01-19 (×5): qty 1

## 2016-01-19 MED ORDER — AMLODIPINE BESYLATE 5 MG PO TABS
5.0000 mg | ORAL_TABLET | Freq: Every day | ORAL | Status: DC
  Administered 2016-01-19 – 2016-01-20 (×2): 5 mg via ORAL
  Filled 2016-01-19 (×2): qty 1

## 2016-01-19 MED ORDER — DIPHENHYDRAMINE HCL 25 MG PO CAPS
25.0000 mg | ORAL_CAPSULE | Freq: Four times a day (QID) | ORAL | Status: DC | PRN
  Administered 2016-01-19 – 2016-01-21 (×3): 25 mg via ORAL
  Filled 2016-01-19 (×3): qty 1

## 2016-01-19 MED ORDER — PANTOPRAZOLE SODIUM 40 MG PO TBEC
40.0000 mg | DELAYED_RELEASE_TABLET | Freq: Every day | ORAL | Status: DC
  Administered 2016-01-19: 40 mg via ORAL
  Filled 2016-01-19: qty 1

## 2016-01-19 MED ORDER — GABAPENTIN 300 MG PO CAPS
300.0000 mg | ORAL_CAPSULE | Freq: Every day | ORAL | Status: DC
  Administered 2016-01-19 – 2016-01-21 (×3): 300 mg via ORAL
  Filled 2016-01-19 (×3): qty 1

## 2016-01-19 MED ORDER — LABETALOL HCL 200 MG PO TABS
200.0000 mg | ORAL_TABLET | Freq: Every day | ORAL | Status: DC
  Administered 2016-01-19 – 2016-01-20 (×2): 200 mg via ORAL
  Filled 2016-01-19 (×4): qty 1

## 2016-01-19 MED ORDER — FAMOTIDINE IN NACL 20-0.9 MG/50ML-% IV SOLN: 20.0000 mg | Freq: Once | INTRAVENOUS | Status: DC

## 2016-01-19 MED ORDER — NICOTINE 7 MG/24HR TD PT24
7.0000 mg | MEDICATED_PATCH | Freq: Every day | TRANSDERMAL | Status: DC
  Administered 2016-01-19 – 2016-01-22 (×3): 7 mg via TRANSDERMAL
  Filled 2016-01-19 (×5): qty 1

## 2016-01-19 MED ORDER — ACETAMINOPHEN 650 MG RE SUPP: 650.0000 mg | Freq: Four times a day (QID) | RECTAL | Status: DC | PRN

## 2016-01-19 MED ORDER — ACETAMINOPHEN 325 MG PO TABS: 650.0000 mg | ORAL_TABLET | Freq: Four times a day (QID) | ORAL | Status: DC | PRN

## 2016-01-19 MED ORDER — RENA-VITE PO TABS
1.0000 | ORAL_TABLET | Freq: Every day | ORAL | Status: DC
  Administered 2016-01-19 – 2016-01-21 (×3): 1 via ORAL
  Filled 2016-01-19 (×3): qty 1

## 2016-01-19 NOTE — ED Provider Notes (Addendum)
[facility] Emergency Department Provider Note        Time seen: ----------------------------------------- 1:32 PM on 01/19/2016 -----------------------------------------    I have reviewed the triage vital signs and the nursing notes.   HISTORY  Chief Complaint Rectal Bleeding    HPI Stanley [name] is a 55 y.o. male who presents to the ER from dialysis today. Patient states he was an hour into treatment with the dialysis center called 911 and the patient reported coffee ground emesis and dark stools. Patient states this was occurring on Saturday but he has not had episodes today. He reportedly takes a daily aspirin, has had gastrointestinal bleeding in the past. Patient states they cauterized the vessel but he feels like it mostly bleed again. He denies fevers or chills, denies chest pain or difficulty breathing.   Past Medical History:  Diagnosis Date  . Alcohol abuse   . Cirrhosis ([place])   . Coronary artery disease   . Diabetic peripheral neuropathy ([place])   . Drug abuse   . End stage renal disease on dialysis [facility]) NEPHROLOGIST-   DR [facility]  IN [place]   HEMODIALYSIS --   TUES/  THURS/  SAT  . GERD (gastroesophageal reflux disease)   . Hypertension   . Suicidal ideation    & HOMICIDAL IDEATION --  06-16-2013   ADMITTED TO BEHAVIOR HEALTH    Patient Active Problem List   Diagnosis Date Noted  . Blood in stool   . Angiodysplasia of stomach and duodenum with hemorrhage   . Gastritis   . Esophagitis, unspecified   . GI bleed 05/16/2015  . Acute GI bleeding   . Symptomatic anemia 04/30/2015  . HTN (hypertension) 04/06/2015  . GERD (gastroesophageal reflux disease) 04/06/2015  . HLD (hyperlipidemia) 04/06/2015  . Dyspnea 04/06/2015  . Cirrhosis ([place]) 04/06/2015  . Ascites 04/06/2015  . GIB (gastrointestinal bleeding) 03/23/2015  . Homicidal ideation 06/19/2013  . Suicidal intent 06/19/2013  . Homicidal ideations 06/19/2013  .  Hyperkalemia 06/16/2013  . Mandible fracture ([place]) 06/05/2013  . Fracture, mandible ([place]) 06/02/2013  . Coronary atherosclerosis of native coronary artery 06/02/2013  . ESRD on dialysis ([place]) 06/02/2013  . Mandible open fracture ([place]) 06/02/2013    Past Surgical History:  Procedure Laterality Date  . CORONARY ANGIOPLASTY  ?   PT UNABLE TO TELL IF  BEFORE OR AFTER  CABG  . CORONARY ARTERY BYPASS GRAFT  2008  ([place] , [place])   3 VESSEL  . DIALYSIS FISTULA CREATION  LAST SURGERY  APPOX  2008  . ESOPHAGOGASTRODUODENOSCOPY N/A 05/07/2015   Procedure: ESOPHAGOGASTRODUODENOSCOPY (EGD);  Surgeon: Hulen Luster, MD;  Location: [facility] ENDOSCOPY;  Service: Endoscopy;  Laterality: N/A;  . ESOPHAGOGASTRODUODENOSCOPY (EGD) WITH PROPOFOL N/A 05/17/2015   Procedure: ESOPHAGOGASTRODUODENOSCOPY (EGD) WITH PROPOFOL;  Surgeon: [name], MD;  Location: [facility] ENDOSCOPY;  Service: Endoscopy;  Laterality: N/A;  . MANDIBULAR HARDWARE REMOVAL N/A 07/29/2013   Procedure: REMOVAL OF ARCH BARS;  Surgeon: Theodoro Kos, DO;  Location: [place];  Service: Plastics;  Laterality: N/A;  . ORIF MANDIBULAR FRACTURE N/A 06/05/2013   Procedure: REPAIR OF MANDIBULAR FRACTURE x 2 with maxillo-mandibular fixation ;  Surgeon: Theodoro Kos, DO;  Location: [place];  Service: Plastics;  Laterality: N/A;    Allergies Patient has no known allergies.  Social History Social History  Substance Use Topics  . Smoking status: Light Tobacco Smoker    Packs/day: 0.00    Years: 40.00    Types: Cigarettes  .  Smokeless tobacco: Never Used  . Alcohol use Yes     Comment: 1 pint/occasionally    Review of Systems Constitutional: Negative for fever. Cardiovascular: Negative for chest pain. Respiratory: Negative for shortness of breath. Gastrointestinal: Negative for abdominal pain, vomiting and diarrhea.Positive for coffee-ground emesis, black stools Genitourinary: Negative for dysuria. Musculoskeletal: Negative for back  pain. Skin: Negative for rash. Neurological: Negative for headaches, focal weakness or numbness.  10-point ROS otherwise negative.  ____________________________________________   PHYSICAL EXAM:  VITAL SIGNS: ED Triage Vitals  Enc Vitals Group     BP      Pulse      Resp      Temp      Temp src      SpO2      Weight      Height      Head Circumference      Peak Flow      Pain Score      Pain Loc      Pain Edu?      Excl. in [place]?     Constitutional: Alert and oriented. Well appearing and in no distress. Eyes: Conjunctivae are normal. PERRL. Normal extraocular movements. ENT   Head: Normocephalic and atraumatic.   Nose: No congestion/rhinnorhea.   Mouth/Throat: Mucous membranes are moist.   Neck: No stridor. Cardiovascular: Normal rate, regular rhythm. No murmurs, rubs, or gallops. Respiratory: Normal respiratory effort without tachypnea nor retractions. Breath sounds are clear and equal bilaterally. No wheezes/rales/rhonchi. Gastrointestinal: Soft and nontender. Normal bowel sounds Rectal: No hemorrhoids, nontender, faintly heme positive stool, no gross blood Musculoskeletal: Nontender with normal range of motion in all extremities. No lower extremity tenderness nor edema. Neurologic:  Normal speech and language. No gross focal neurologic deficits are appreciated.  Skin:  Skin is warm, dry and intact. No rash noted. Psychiatric: Mood and affect are normal. Speech and behavior are normal.  ____________________________________________  ED COURSE:  Pertinent labs & imaging results that were available during my care of the patient were reviewed by me and considered in my medical decision making (see chart for details). Clinical Course   Patient presents to the ER for gastrointestinal bleeding. We will assess with labs and reevaluate.  Procedures ____________________________________________   LABS (pertinent positives/negatives)  Labs Reviewed  CBC WITH  DIFFERENTIAL/PLATELET - Abnormal; Notable for the following:       Result Value   RDW 17.0 (*)    Neutro Abs 7.5 (*)    Lymphs Abs 0.7 (*)    All other components within normal limits  COMPREHENSIVE METABOLIC PANEL - Abnormal; Notable for the following:    Glucose, Bld 151 (*)    BUN 34 (*)    Creatinine, Ser 7.47 (*)    Calcium 8.7 (*)    Alkaline Phosphatase 184 (*)    Total Bilirubin 1.4 (*)    GFR calc non Af Amer 7 (*)    GFR calc Af Amer 8 (*)    All other components within normal limits  PROTIME-INR - Abnormal; Notable for the following:    Prothrombin Time 15.7 (*)    All other components within normal limits  LIPASE, BLOOD  APTT  TYPE AND SCREEN  ____________________________________________  FINAL ASSESSMENT AND PLAN  Gastrointestinal bleeding  Plan: Patient with labs as dictated above. Patient is in no acute distress, labs are currently stable. Patient states he does not feel well enough to go home. I will discuss with the hospitalist for observation, repeat hemoglobin and  repeat antacid infusion.   Earleen Newport, MD   Note: This dictation was prepared with Dragon dictation. Any transcriptional errors that result from this process are unintentional    Earleen Newport, MD 01/19/16 [place], MD 01/19/16 [phone]

## 2016-01-19 NOTE — Consult Note (Signed)
Stanley Bellows MD  [address] [place], [place] [address] Phone: [phone] Fax : [phone]  Consultation  Referring Provider:     No ref. provider found Primary Care Physician:  Dagoberto Ligas, MD Primary Gastroenterologist:  Dr. Allen Norris         Reason for Consultation:     GI bleed  Date of Admission:  01/19/2016 Date of Consultation:  01/19/2016         HPI:   Stanley [name] is a 55 y.o. male who has a history of coronary artery disease status post CABG, hypertension, gastroesophageal reflux disease, alcohol abuse and liver cirrhosis, end-stage renal disease on hemodialysis. He presented to the ER with vomiting "pink material" on Saturday , seen some blood in his stool as well   He says that on Saturday coughed up vs threw up half a cupful of blood and non there after. Says he did have dark stools earlier but are presently brown. Denies any NSAID use. No isses presently .  Says he quit drinking all alcohol 1 year back.    He underwent an EGD for a GI bleed in 05/2015 by Dr Allen Norris and was found to have grade B esophagitis as well as AVM of the stomach which was ablated with APC. He presented to the ER with a GI bleed in 04/2015 , transfused , EGD showed gastric AVM that was cauterized. In 05/2015 he underwent therapeutic paracentesis of his abdomen and 3.5L was aspirated.   His last colonoscopy in 04/2015- small polyp resected no AVM's.  Past Medical History:  Diagnosis Date  . Alcohol abuse   . Cirrhosis ([place])   . Coronary artery disease   . Diabetic peripheral neuropathy ([place])   . Drug abuse   . End stage renal disease on dialysis [facility]) NEPHROLOGIST-   DR [facility]  IN [place]   HEMODIALYSIS --   TUES/  THURS/  SAT  . GERD (gastroesophageal reflux disease)   . Hypertension   . Suicidal ideation    & HOMICIDAL IDEATION --  06-16-2013   ADMITTED TO BEHAVIOR HEALTH    Past Surgical History:  Procedure Laterality Date  . CORONARY ANGIOPLASTY  ?   PT UNABLE TO TELL IF  BEFORE OR  AFTER  CABG  . CORONARY ARTERY BYPASS GRAFT  2008  ([place] , [place])   3 VESSEL  . DIALYSIS FISTULA CREATION  LAST SURGERY  APPOX  2008  . ESOPHAGOGASTRODUODENOSCOPY N/A 05/07/2015   Procedure: ESOPHAGOGASTRODUODENOSCOPY (EGD);  Surgeon: Hulen Luster, MD;  Location: [facility] ENDOSCOPY;  Service: Endoscopy;  Laterality: N/A;  . ESOPHAGOGASTRODUODENOSCOPY (EGD) WITH PROPOFOL N/A 05/17/2015   Procedure: ESOPHAGOGASTRODUODENOSCOPY (EGD) WITH PROPOFOL;  Surgeon: [name], MD;  Location: [facility] ENDOSCOPY;  Service: Endoscopy;  Laterality: N/A;  . MANDIBULAR HARDWARE REMOVAL N/A 07/29/2013   Procedure: REMOVAL OF ARCH BARS;  Surgeon: Theodoro Kos, DO;  Location: [place];  Service: Plastics;  Laterality: N/A;  . ORIF MANDIBULAR FRACTURE N/A 06/05/2013   Procedure: REPAIR OF MANDIBULAR FRACTURE x 2 with maxillo-mandibular fixation ;  Surgeon: Theodoro Kos, DO;  Location: [place];  Service: Plastics;  Laterality: N/A;    Prior to Admission medications   Medication Sig Start Date End Date Taking? Authorizing Provider  amLODipine (NORVASC) 5 MG tablet Take 5 mg by mouth daily.   Yes Historical Provider, MD  aspirin EC 81 MG tablet Take 81 mg by mouth at bedtime.    Yes Historical Provider, MD  b complex-vitamin c-folic acid (NEPHRO-VITE) 0.8  MG TABS tablet Take 1 tablet by mouth daily.   Yes Historical Provider, MD  calcium acetate (PHOSLO) 667 MG capsule Take 2,001 mg by mouth 3 (three) times daily with meals.   Yes Historical Provider, MD  cinacalcet (SENSIPAR) 30 MG tablet Take 30 mg by mouth daily with supper.   Yes Historical Provider, MD  gabapentin (NEURONTIN) 300 MG capsule Take 1 capsule (300 mg total) by mouth daily. Patient taking differently: Take 300 mg by mouth at bedtime.  03/27/15  Yes Bettey Costa, MD  labetalol (NORMODYNE) 200 MG tablet Take 200 mg by mouth daily.    Yes Historical Provider, MD  omeprazole (PRILOSEC) 20 MG capsule Take 20 mg by mouth daily.   Yes Historical Provider,  MD    Family History  Problem Relation Age of Onset  . Colon cancer Mother   . Cancer Father   . Cancer Sister      Social History  Substance Use Topics  . Smoking status: Light Tobacco Smoker    Packs/day: 0.00    Years: 40.00    Types: Cigarettes  . Smokeless tobacco: Never Used  . Alcohol use Yes     Comment: 1 pint/occasionally    Allergies as of 01/19/2016  . (No Known Allergies)    Review of Systems:    All systems reviewed and negative except where noted in HPI.   Physical Exam:  Vital signs in last 24 hours: Temp:  [97.7 F (36.5 C)] 97.7 F (36.5 C) (11/07 1327) Pulse Rate:  [70-80] 76 (11/07 1430) Resp:  [14-22] 14 (11/07 1430) BP: (123-157)/(84-101) 145/95 (11/07 1430) SpO2:  [97 %-98 %] 97 % (11/07 1430) Weight:  [180 lb (81.6 kg)] 180 lb (81.6 kg) (11/07 1332)   General:   Pleasant, cooperative in NAD Head:  Normocephalic and atraumatic. Eyes:   No icterus.   Conjunctiva pink. PERRLA. Ears:  Normal auditory acuity. Neck:  Supple; no masses or thyroidomegaly Lungs: Respirations even and unlabored. Lungs clear to auscultation bilaterally.   No wheezes, crackles, or rhonchi.  Heart:  Regular rate and rhythm;  Without murmur, clicks, rubs or gallops Abdomen:  Soft, nondistended, nontender. Normal bowel sounds. No appreciable masses or hepatomegaly.  No rebound or guarding.  Rectal:  Not performed. Msk:  Symmetrical without gross deformities.  Strength  Extremities:  Without edema, cyanosis or clubbing. Neurologic:  Alert and oriented x3;  grossly normal neurologically. Skin:  Intact without significant lesions or rashes. Cervical Nodes:  No significant cervical adenopathy. Psych:  Alert and cooperative. Normal affect.  LAB RESULTS:  Recent Labs  01/19/16 1335  WBC 9.2  HGB 13.7  HCT 40.8  PLT 190   BMET  Recent Labs  01/19/16 1335  NA 137  K 3.9  CL 101  CO2 26  GLUCOSE 151*  BUN 34*  CREATININE 7.47*  CALCIUM 8.7*    LFT  Recent Labs  01/19/16 1335  PROT 7.3  ALBUMIN 3.7  AST 30  ALT 32  ALKPHOS 184*  BILITOT 1.4*   PT/INR  Recent Labs  01/19/16 1335  LABPROT 15.7*  INR 1.24    STUDIES: No results found.    Impression / Plan:   Drue Harr is a 55 y.o. y/o male with a history of alcoholic liver disease, cirrhosis ,ascites, prior gastric AVM s/p ablation in feb and march 2017 here with a single episode of hematemesis. Non since Saturday. He has a history of alcohol abuse, ascites, gastric AVM's ablated in 04/2015  and 05/2015 . Hemodynamically stable.  Plan :  1. Keep NPO 2. EGD tomorrow 3. IV PPI  4. Clinically has no ascites- suggest USG  5. OP GI follow up for his chronic liver disease   Thank you for involving me in the care of this patient.      LOS: 0 days   Stanley Bellows, MD  01/19/2016, 3:28 PM

## 2016-01-19 NOTE — H&P (Addendum)
[facility] [place] NAME: Stanley [name]    MR#:  [id number]  DATE OF BIRTH:  21-Jan-1961  DATE OF ADMISSION:  01/19/2016  PRIMARY CARE PHYSICIAN: Dr. Murlean Iba  REQUESTING/REFERRING PHYSICIAN: Dr Cephas Darby  CHIEF COMPLAINT:   Chief Complaint  Patient presents with  . Rectal Bleeding    HISTORY OF PRESENT ILLNESS:  Stanley [name]  is a 55 y.o. male with a known history of Angiectasia in the stomach on last endoscopy. He presents to the ER today during dialysis. He states on Saturday he vomited up some pink material. He seen some blood in the stool and when she told the ER physician it was black but then he told me it was bright red. He said he also coughs up a little blood. Patient states his abdomen is also been distended. He states he can hardly walk because of the pins and needles feeling in his feet.  PAST MEDICAL HISTORY:   Past Medical History:  Diagnosis Date  . Alcohol abuse   . Cirrhosis ([place])   . Coronary artery disease   . Diabetic peripheral neuropathy ([place])   . Drug abuse   . End stage renal disease on dialysis [facility]) NEPHROLOGIST-   DR [facility]  IN [place]   HEMODIALYSIS --   TUES/  THURS/  SAT  . GERD (gastroesophageal reflux disease)   . Hypertension   . Suicidal ideation    & HOMICIDAL IDEATION --  06-16-2013   ADMITTED TO BEHAVIOR HEALTH    PAST SURGICAL HISTORY:   Past Surgical History:  Procedure Laterality Date  . CORONARY ANGIOPLASTY  ?   PT UNABLE TO TELL IF  BEFORE OR AFTER  CABG  . CORONARY ARTERY BYPASS GRAFT  2008  ([place] , [place])   3 VESSEL  . DIALYSIS FISTULA CREATION  LAST SURGERY  APPOX  2008  . ESOPHAGOGASTRODUODENOSCOPY N/A 05/07/2015   Procedure: ESOPHAGOGASTRODUODENOSCOPY (EGD);  Surgeon: Hulen Luster, MD;  Location: [facility] ENDOSCOPY;  Service: Endoscopy;  Laterality: N/A;  . ESOPHAGOGASTRODUODENOSCOPY (EGD) WITH PROPOFOL N/A 05/17/2015   Procedure: ESOPHAGOGASTRODUODENOSCOPY  (EGD) WITH PROPOFOL;  Surgeon: [name], MD;  Location: [facility] ENDOSCOPY;  Service: Endoscopy;  Laterality: N/A;  . MANDIBULAR HARDWARE REMOVAL N/A 07/29/2013   Procedure: REMOVAL OF ARCH BARS;  Surgeon: Theodoro Kos, DO;  Location: [place];  Service: Plastics;  Laterality: N/A;  . ORIF MANDIBULAR FRACTURE N/A 06/05/2013   Procedure: REPAIR OF MANDIBULAR FRACTURE x 2 with maxillo-mandibular fixation ;  Surgeon: Theodoro Kos, DO;  Location: [place];  Service: Plastics;  Laterality: N/A;    SOCIAL HISTORY:   Social History  Substance Use Topics  . Smoking status: Light Tobacco Smoker    Packs/day: 0.00    Years: 40.00    Types: Cigarettes  . Smokeless tobacco: Never Used  . Alcohol use Yes     Comment: 1 pint/occasionally    FAMILY HISTORY:   Family History  Problem Relation Age of Onset  . Colon cancer Mother   . Cancer Father   . Cancer Sister     DRUG ALLERGIES:  No Known Allergies  REVIEW OF SYSTEMS:  CONSTITUTIONAL: Positive for fever. Positive for fatigue and weakness. Positive for weight gain EYES: States he can't see well EARS, NOSE, AND THROAT: No tinnitus or ear pain. No sore throat. Positive for runny nose RESPIRATORY: Positive for cough, shortness of breath, and hemoptysis.  CARDIOVASCULAR: No chest pain, orthopnea, edema.  GASTROINTESTINAL: Positive  for nausea, vomiting, and hematemesis. Occasional diarrhea. No abdominal pain. Positive for blood in the bowel movements GENITOURINARY: No urine ENDOCRINE: No polyuria, nocturia,  HEMATOLOGY: No anemia. SKIN: No rash or lesion. Positive for itching. MUSCULOSKELETAL: No joint pain or arthritis.   NEUROLOGIC: Positive for numbness and tingling in feet  PSYCHIATRY: No anxiety or depression.   MEDICATIONS AT HOME:   Prior to Admission medications   Medication Sig Start Date End Date Taking? Authorizing Provider  amLODipine (NORVASC) 5 MG tablet Take 5 mg by mouth daily.   Yes Historical Provider,  MD  aspirin EC 81 MG tablet Take 81 mg by mouth at bedtime.    Yes Historical Provider, MD  b complex-vitamin c-folic acid (NEPHRO-VITE) 0.8 MG TABS tablet Take 1 tablet by mouth daily.   Yes Historical Provider, MD  calcium acetate (PHOSLO) 667 MG capsule Take 2,001 mg by mouth 3 (three) times daily with meals.   Yes Historical Provider, MD  cinacalcet (SENSIPAR) 30 MG tablet Take 30 mg by mouth daily with supper.   Yes Historical Provider, MD  gabapentin (NEURONTIN) 300 MG capsule Take 1 capsule (300 mg total) by mouth daily. Patient taking differently: Take 300 mg by mouth at bedtime.  03/27/15  Yes Bettey Costa, MD  labetalol (NORMODYNE) 200 MG tablet Take 200 mg by mouth daily.    Yes Historical Provider, MD  omeprazole (PRILOSEC) 20 MG capsule Take 20 mg by mouth daily.   Yes Historical Provider, MD      VITAL SIGNS:  Blood pressure (!) 145/95, pulse 76, temperature 97.7 F (36.5 C), temperature source Oral, resp. rate 14, height 6\' 3"  (1.905 m), weight 81.6 kg (180 lb), SpO2 97 %.  PHYSICAL EXAMINATION:  GENERAL:  55 y.o.-year-old patient lying in the bed with no acute distress.  EYES: Pupils equal, round, reactive to light and accommodation. No scleral icterus. Extraocular muscles intact.  HEENT: Head atraumatic, normocephalic. Oropharynx and nasopharynx clear.  NECK:  Supple, no jugular venous distention. No thyroid enlargement, no tenderness.  LUNGS: Normal breath sounds bilaterally, no wheezing, rales,rhonchi or crepitation. No use of accessory muscles of respiration.  CARDIOVASCULAR: S1, S2 normal. No murmurs, rubs, or gallops.  ABDOMEN: Soft, nontender, Slight distended. Bowel sounds present. No organomegaly or mass.  EXTREMITIES: No pedal edema, cyanosis, or clubbing.  NEUROLOGIC: Cranial nerves II through XII are intact. Muscle strength 5/5 in all extremities. Sensation intact. Gait not checked.  PSYCHIATRIC: The patient is alert and oriented x 3.  SKIN: No rash, lesion, or  ulcer.   ER physician did rectal exam which was brown stool which was trace guaiac positive  LABORATORY PANEL:   CBC  Recent Labs Lab 01/19/16 1335  WBC 9.2  HGB 13.7  HCT 40.8  PLT 190   ------------------------------------------------------------------------------------------------------------------  Chemistries   Recent Labs Lab 01/19/16 1335  NA 137  K 3.9  CL 101  CO2 26  GLUCOSE 151*  BUN 34*  CREATININE 7.47*  CALCIUM 8.7*  AST 30  ALT 32  ALKPHOS 184*  BILITOT 1.4*   ------------------------------------------------------------------------------------------------------------------   RADIOLOGY:  Chest x-ray ordered by me  IMPRESSION AND PLAN:   1. Upper GI bleed with hematemesis. Patient has a history of angiectasia is. Case discussed with Dr. Vicente Males gastroenterology to evaluate for possible endoscopy. Keep nothing by mouth for now. Protonix. Stop aspirin. Observe overnight. Serial hemoglobins. 2. Abdominal distention ultrasound of the abdomen to evaluate for ascites and paracentesis if there is enough ascites. Patient has a history of  cirrhosis. 3. Essential hypertension on labetalol and Norvasc 4. End-stage renal disease on dialysis Tuesday Thursday and Saturday 5. Chronic foot pain on gabapentin 6. History of coronary artery disease. Stop aspirin. 7. Tobacco abuse smoking cessation counseling done 3 minutes by me. Low-dose nicotine patch. 8. Patient states that he is also coughing up blood which is less likely but I'll obtain a chest x-ray PA and lateral.  All the records are reviewed and case discussed with ED provider. Management plans discussed with the patient, family and they are in agreement.  CODE STATUS: Full code  TOTAL TIME TAKING CARE OF THIS PATIENT: 50 minutes.    Loletha Grayer M.D on 01/19/2016 at 3:17 PM  Between 7am to 6pm - Pager - [phone]  After 6pm call admission pager [phone]  [facility] Office   [phone]  CC: Primary care physician; Dr Murlean Iba

## 2016-01-19 NOTE — ED Notes (Signed)
Patient transported to X-ray 

## 2016-01-19 NOTE — ED Triage Notes (Signed)
Per [facility], patient was at dialysis today. He was an hour into treatment when the dialysis center called 911 after patient reported coffee ground emesis and dark stools. Patient states this has been ongoing since Saturday. Patient is A&O x4. Takes daily ASA. Patient denies pain.

## 2016-01-19 NOTE — Care Management Obs Status (Signed)
[place] NOTIFICATION   Patient Details  Name: Stanley [name] MRN: [id number] Date of Birth: [birth year]   Medicare Observation Status Notification Given:  Yes    Beau Fanny, RN 01/19/2016, 3:17 PM

## 2016-01-20 ENCOUNTER — Encounter: Payer: Self-pay | Admitting: *Deleted

## 2016-01-20 ENCOUNTER — Encounter
Admission: EM | Disposition: A | Discharge status: Home / Self Care | Payer: Self-pay | Source: Home / Self Care | Attending: Emergency Medicine

## 2016-01-20 ENCOUNTER — Observation Stay: Payer: Medicare Other | Admitting: Anesthesiology

## 2016-01-20 ENCOUNTER — Observation Stay: Payer: Medicare Other

## 2016-01-20 ENCOUNTER — Other Ambulatory Visit: Payer: Self-pay | Admitting: Gastroenterology

## 2016-01-20 DIAGNOSIS — R188 Other ascites: Secondary | ICD-10-CM | POA: Diagnosis not present

## 2016-01-20 DIAGNOSIS — K2901 Acute gastritis with bleeding: Secondary | ICD-10-CM | POA: Diagnosis not present

## 2016-01-20 DIAGNOSIS — K922 Gastrointestinal hemorrhage, unspecified: Secondary | ICD-10-CM | POA: Diagnosis not present

## 2016-01-20 DIAGNOSIS — K2960 Other gastritis without bleeding: Secondary | ICD-10-CM | POA: Diagnosis not present

## 2016-01-20 DIAGNOSIS — K2950 Unspecified chronic gastritis without bleeding: Secondary | ICD-10-CM | POA: Diagnosis not present

## 2016-01-20 DIAGNOSIS — I10 Essential (primary) hypertension: Secondary | ICD-10-CM | POA: Diagnosis not present

## 2016-01-20 DIAGNOSIS — N186 End stage renal disease: Secondary | ICD-10-CM | POA: Diagnosis not present

## 2016-01-20 DIAGNOSIS — R140 Abdominal distension (gaseous): Secondary | ICD-10-CM | POA: Diagnosis not present

## 2016-01-20 HISTORY — PX: ESOPHAGOGASTRODUODENOSCOPY (EGD) WITH PROPOFOL: SHX5813

## 2016-01-20 LAB — BODY FLUID CELL COUNT WITH DIFFERENTIAL
EOS FL: 0 %
LYMPHS FL: 18 %
Monocyte-Macrophage-Serous Fluid: 81 %
NEUTROPHIL FLUID: 1 %
Other Cells, Fluid: 0 %
WBC FLUID: 257 uL

## 2016-01-20 LAB — CBC
HCT: 42.4 % (ref 40.0–52.0)
Hemoglobin: 13.6 g/dL (ref 13.0–18.0)
MCH: 30.6 pg (ref 26.0–34.0)
MCHC: 32.1 g/dL (ref 32.0–36.0)
MCV: 95.2 fL (ref 80.0–100.0)
PLATELETS: 170 10*3/uL (ref 150–440)
RBC: 4.46 MIL/uL (ref 4.40–5.90)
RDW: 16.9 % — AB (ref 11.5–14.5)
WBC: 6.2 10*3/uL (ref 3.8–10.6)

## 2016-01-20 LAB — GRAM STAIN

## 2016-01-20 LAB — BASIC METABOLIC PANEL
ANION GAP: 13 (ref 5–15)
BUN: 39 mg/dL — ABNORMAL HIGH (ref 6–20)
CHLORIDE: 102 mmol/L (ref 101–111)
CO2: 24 mmol/L (ref 22–32)
Calcium: 8.9 mg/dL (ref 8.9–10.3)
Creatinine, Ser: 9.09 mg/dL — ABNORMAL HIGH (ref 0.61–1.24)
GFR calc Af Amer: 7 mL/min — ABNORMAL LOW (ref >60)
GFR calc non Af Amer: 6 mL/min — ABNORMAL LOW (ref >60)
GLUCOSE: 87 mg/dL (ref 65–99)
POTASSIUM: 4.9 mmol/L (ref 3.5–5.1)
Sodium: 139 mmol/L (ref 135–145)

## 2016-01-20 LAB — PROTEIN, BODY FLUID: Total protein, fluid: 3.6 g/dL

## 2016-01-20 LAB — GLUCOSE, SEROUS FLUID: Glucose, Fluid: 98 mg/dL

## 2016-01-20 LAB — PATHOLOGIST SMEAR REVIEW

## 2016-01-20 SURGERY — ESOPHAGOGASTRODUODENOSCOPY (EGD) WITH PROPOFOL
Anesthesia: General

## 2016-01-20 MED ORDER — SODIUM CHLORIDE 0.9 % IV SOLN
INTRAVENOUS | Status: DC
  Administered 2016-01-20: 11:00:00 via INTRAVENOUS

## 2016-01-20 MED ORDER — PROPOFOL 10 MG/ML IV BOLUS
INTRAVENOUS | Status: DC | PRN
  Administered 2016-01-20 (×2): 50 mg via INTRAVENOUS

## 2016-01-20 MED ORDER — PROPOFOL 500 MG/50ML IV EMUL
INTRAVENOUS | Status: DC | PRN
  Administered 2016-01-20: 160 ug/kg/min via INTRAVENOUS

## 2016-01-20 NOTE — Anesthesia Postprocedure Evaluation (Signed)
Anesthesia Post Note  Patient: Stanley [name]  Procedure(s) Performed: Procedure(s) (LRB): ESOPHAGOGASTRODUODENOSCOPY (EGD) WITH PROPOFOL (N/A)  Patient location during evaluation: Endoscopy Anesthesia Type: General Level of consciousness: awake and alert Pain management: pain level controlled Vital Signs Assessment: post-procedure vital signs reviewed and stable Respiratory status: spontaneous breathing, nonlabored ventilation and respiratory function stable Cardiovascular status: blood pressure returned to baseline and stable Postop Assessment: no signs of nausea or vomiting Anesthetic complications: no    Last Vitals:  Vitals:   01/20/16 1022 01/20/16 1112  BP: 132/83 101/75  Pulse: (!) 57 67  Resp: 16 16  Temp: 37.5 C 36.7 C    Last Pain:  Vitals:   01/20/16 1112  TempSrc: Tympanic  PainSc: Asleep                 Orange [place]oss

## 2016-01-20 NOTE — Anesthesia Preprocedure Evaluation (Signed)
Anesthesia Evaluation  Patient identified by MRN, date of birth, ID band Patient awake    Reviewed: Allergy & Precautions, NPO status , Patient's Chart, lab work & pertinent test results  History of Anesthesia Complications Negative for: history of anesthetic complications  Airway Mallampati: II  TM Distance: >3 FB Neck ROM: Full    Dental  (+) Poor Dentition, Missing   Pulmonary neg sleep apnea, neg COPD, Current Smoker,    breath sounds clear to auscultation- rhonchi (-) wheezing      Cardiovascular hypertension, + CAD, + Past MI and + CABG   Rhythm:Regular Rate:Normal - Systolic murmurs and - Diastolic murmurs Echo 05/04/23: - Left ventricle: The cavity size was mildly dilated. Systolic   function was normal. The estimated ejection fraction was in the   range of 50% to 55%. Wall motion was normal; there were no   regional wall motion abnormalities. Left ventricular diastolic   function parameters were normal. - Mitral valve: There was moderate regurgitation directed   eccentrically. - Left atrium: The atrium was moderately dilated. - Right ventricle: Systolic function was mildly reduced. - Right atrium: The atrium was mildly dilated. - Tricuspid valve: There was moderate-severe regurgitation. - Pulmonary arteries: Systolic pressure was moderately elevated. PA   peak pressure: 54 mm Hg (S).   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   Endo/Other  diabetes  Renal/GU ESRF and DialysisRenal disease (last dialysis yesterday)     Musculoskeletal   Abdominal (+) - obese,   Peds  Hematology  (+) anemia ,   Anesthesia Other Findings Past Medical History: No date: Alcohol abuse No date: Cirrhosis ([place]) No date: Coronary artery disease No date: Diabetic peripheral neuropathy (HCC) No date: Drug abuse NEPHROLOGIST-   DR [facility]  IN [place]: End stage  renal disease on dialysis ([place])     Comment: HEMODIALYSIS --   TUES/  THURS/  SAT No date: GERD (gastroesophageal reflux disease) No date: Hypertension No date: Suicidal ideation     Comment: & HOMICIDAL IDEATION --  06-16-2013   ADMITTED              TO BEHAVIOR HEALTH   Reproductive/Obstetrics                            Anesthesia Physical Anesthesia Plan  ASA: III  Anesthesia Plan: General   Post-op Pain Management:    Induction: Intravenous  Airway Management Planned: Natural Airway  Additional Equipment:   Intra-op Plan:   Post-operative Plan:   Informed Consent: I have reviewed the patients History and Physical, chart, labs and discussed the procedure including the risks, benefits and alternatives for the proposed anesthesia with the patient or authorized representative who has indicated his/her understanding and acceptance.   Dental advisory given  Plan Discussed with: CRNA and Anesthesiologist  Anesthesia Plan Comments:         Anesthesia Quick Evaluation

## 2016-01-20 NOTE — Progress Notes (Signed)
Pt laying in bed and complaining of hunger as he prepares for a procedure. CH is available.   01/20/16 0955  Clinical Encounter Type  Visited With Patient  Visit Type Initial  Referral From Nurse  Spiritual Encounters  Spiritual Needs Emotional  Stress Factors  Patient Stress Factors None identified

## 2016-01-20 NOTE — Transfer of Care (Signed)
Immediate Anesthesia Transfer of Care Note  Patient: Stanley [name]  Procedure(s) Performed: Procedure(s): ESOPHAGOGASTRODUODENOSCOPY (EGD) WITH PROPOFOL (N/A)  Patient Location: PACU  Anesthesia Type:General  Level of Consciousness: sedated  Airway & Oxygen Therapy: Patient Spontanous Breathing and Patient connected to face mask oxygen  Post-op Assessment: Report given to RN and Post -op Vital signs reviewed and stable  Post vital signs: Reviewed and stable  Last Vitals:  Vitals:   01/20/16 1022 01/20/16 1112  BP: 132/83 101/75  Pulse: (!) 57 67  Resp: 16 16  Temp: 37.5 C 30.0 C    Complications: No apparent anesthesia complications

## 2016-01-20 NOTE — Anesthesia Procedure Notes (Signed)
Date/Time: 01/20/2016 10:54 AM Performed by: Doreen Salvage Pre-anesthesia Checklist: Patient identified, Emergency Drugs available, Suction available and Patient being monitored Patient Re-evaluated:Patient Re-evaluated prior to inductionOxygen Delivery Method: Nasal cannula Intubation Type: IV induction Dental Injury: Teeth and Oropharynx as per pre-operative assessment  Comments: Nasal cannula with etCO2 monitoring

## 2016-01-20 NOTE — H&P (Signed)
[name] MD [address], [place], [place] [address] Phone: [phone] Fax : [phone]  Primary Care Physician:  Dagoberto Ligas, MD Primary Gastroenterologist:  Dr. [name]   Pre-Procedure History & Physical: HPI:  Stanley [name] is a 55 y.o. male is here for an endoscopy.   Past Medical History:  Diagnosis Date  . Alcohol abuse   . Cirrhosis ([place])   . Coronary artery disease   . Diabetic peripheral neuropathy ([place])   . Drug abuse   . End stage renal disease on dialysis [facility]) NEPHROLOGIST-   DR [facility]  IN [place]   HEMODIALYSIS --   TUES/  THURS/  SAT  . GERD (gastroesophageal reflux disease)   . Hypertension   . Suicidal ideation    & HOMICIDAL IDEATION --  06-16-2013   ADMITTED TO BEHAVIOR HEALTH    Past Surgical History:  Procedure Laterality Date  . CORONARY ANGIOPLASTY  ?   PT UNABLE TO TELL IF  BEFORE OR AFTER  CABG  . CORONARY ARTERY BYPASS GRAFT  2008  ([place] , [place])   3 VESSEL  . DIALYSIS FISTULA CREATION  LAST SURGERY  APPOX  2008  . ESOPHAGOGASTRODUODENOSCOPY N/A 05/07/2015   Procedure: ESOPHAGOGASTRODUODENOSCOPY (EGD);  Surgeon: Hulen Luster, MD;  Location: [facility] ENDOSCOPY;  Service: Endoscopy;  Laterality: N/A;  . ESOPHAGOGASTRODUODENOSCOPY (EGD) WITH PROPOFOL N/A 05/17/2015   Procedure: ESOPHAGOGASTRODUODENOSCOPY (EGD) WITH PROPOFOL;  Surgeon: [name], MD;  Location: [facility] ENDOSCOPY;  Service: Endoscopy;  Laterality: N/A;  . MANDIBULAR HARDWARE REMOVAL N/A 07/29/2013   Procedure: REMOVAL OF ARCH BARS;  Surgeon: Theodoro Kos, DO;  Location: [place];  Service: Plastics;  Laterality: N/A;  . ORIF MANDIBULAR FRACTURE N/A 06/05/2013   Procedure: REPAIR OF MANDIBULAR FRACTURE x 2 with maxillo-mandibular fixation ;  Surgeon: Theodoro Kos, DO;  Location: [place];  Service: Plastics;  Laterality: N/A;    Prior to Admission medications   Medication Sig Start Date End Date Taking? Authorizing Provider  amLODipine (NORVASC) 5 MG  tablet Take 5 mg by mouth daily.   Yes Historical Provider, MD  aspirin EC 81 MG tablet Take 81 mg by mouth at bedtime.    Yes Historical Provider, MD  b complex-vitamin c-folic acid (NEPHRO-VITE) 0.8 MG TABS tablet Take 1 tablet by mouth daily.   Yes Historical Provider, MD  calcium acetate (PHOSLO) 667 MG capsule Take 2,001 mg by mouth 3 (three) times daily with meals.   Yes Historical Provider, MD  cinacalcet (SENSIPAR) 30 MG tablet Take 30 mg by mouth daily with supper.   Yes Historical Provider, MD  gabapentin (NEURONTIN) 300 MG capsule Take 1 capsule (300 mg total) by mouth daily. Patient taking differently: Take 300 mg by mouth at bedtime.  03/27/15  Yes Bettey Costa, MD  labetalol (NORMODYNE) 200 MG tablet Take 200 mg by mouth daily.    Yes Historical Provider, MD  omeprazole (PRILOSEC) 20 MG capsule Take 20 mg by mouth daily.   Yes Historical Provider, MD    Allergies as of 01/19/2016  . (No Known Allergies)    Family History  Problem Relation Age of Onset  . Colon cancer Mother   . Cancer Father   . Cancer Sister     Social History   Social History  . Marital status: Single    Spouse name: N/A  . Number of children: N/A  . Years of education: N/A   Occupational History  . Not on file.   Social History  Main Topics  . Smoking status: Light Tobacco Smoker    Packs/day: 0.00    Years: 40.00    Types: Cigarettes  . Smokeless tobacco: Never Used  . Alcohol use Yes     Comment: 1 pint/occasionally  . Drug use:     Frequency: 7.0 times per week    Types: Codeine     Comment: 06-18-2013  positive drug screen for cocaine--  (07-25-2013  DENIES ANY DRUG USE SINCE ADMISSION 06-16-2013)  . Sexual activity: No   Other Topics Concern  . Not on file   Social History Narrative  . No narrative on file    Review of Systems: See HPI, otherwise negative ROS  Physical Exam: BP 132/83   Pulse (!) 57   Temp 99.5 F (37.5 C) (Tympanic)   Resp 16   Ht 6\' 3"  (1.905 m)   Wt  180 lb (81.6 kg)   SpO2 99%   BMI 22.50 kg/m  General:   Alert,  pleasant and cooperative in NAD Head:  Normocephalic and atraumatic. Neck:  Supple; no masses or thyromegaly. Lungs:  Clear throughout to auscultation.    Heart:  Regular rate and rhythm. Abdomen:  Soft, nontender and nondistended. Normal bowel sounds, without guarding, and without rebound.   Neurologic:  Alert and  oriented x4;  grossly normal neurologically.  Impression/Plan: Stanley [name] is here for an endoscopy to be performed for GI bleed  Risks, benefits, limitations, and alternatives regarding  endoscopy have been reviewed with the patient.  Questions have been answered.  All parties agreeable.   [name], MD  01/20/2016, 10:44 AM

## 2016-01-20 NOTE — Care Management (Signed)
Notification of admission sent to [facility] Liaison

## 2016-01-20 NOTE — Progress Notes (Signed)
GI follow up for hematemesis:    EGD showed no blood anywhere. Moderate gastritis, biopsies taken.   Advance diet and when tolerating Po, can be discharged and follow up in GI clinic as outpatient. Discharge on PPI  Dr [name]  Gastroenterology/Hepatology Pager: [phone]

## 2016-01-20 NOTE — Op Note (Signed)
[facility] Gastroenterology Patient Name: Stanley [name] Procedure Date: 01/20/2016 10:50 AM MRN: [id number] Account #: [id number] Date of Birth: May 09, 1960 Admit Type: Outpatient Age: 55 Room: [facility] ENDO ROOM 3 Gender: Male Note Status: Finalized Procedure:            Upper GI endoscopy Indications:          Coffee-ground emesis Providers:            [name] MD, MD Referring MD:         Romie Jumper, MD (Referring MD) Medicines:            Monitored Anesthesia Care Complications:        No immediate complications. Procedure:            Pre-Anesthesia Assessment:                       - Prior to the procedure, a History and Physical was                        performed, and patient medications, allergies and                        sensitivities were reviewed. The patient's tolerance of                        previous anesthesia was reviewed.                       - ASA Grade Assessment: III - A patient with severe                        systemic disease.                       After obtaining informed consent, the endoscope was                        passed under direct vision. Throughout the procedure,                        the patient's blood pressure, pulse, and oxygen                        saturations were monitored continuously. The Endoscope                        was introduced through the mouth, and advanced to the                        third part of duodenum. The upper GI endoscopy was                        accomplished with ease. The patient tolerated the                        procedure well. Findings:      The examined duodenum was normal.      The esophagus was normal.      Diffuse severe inflammation characterized by congestion (edema) and       erythema was found in the gastric body, at the incisura, in the  gastric       antrum and in the prepyloric region of the stomach. Biopsies were taken       with a cold forceps for Helicobacter  pylori testing. Impression:           - Normal examined duodenum.                       - Normal esophagus.                       - Chronic gastritis. Biopsied. Recommendation:       - Await pathology results.                       - Return to my office in 4 weeks.                       - Low sodium diet today.                       - Return patient to hospital ward for ongoing care. Procedure Code(s):    --- Professional ---                       [phone], Esophagogastroduodenoscopy, flexible, transoral;                        with biopsy, single or multiple Diagnosis Code(s):    --- Professional ---                       K92.0, Hematemesis                       K29.50, Unspecified chronic gastritis without bleeding CPT copyright 2016 American Medical Association. All rights reserved. The codes documented in this report are preliminary and upon coder review may  be revised to meet current compliance requirements. [name], MD [name] MD, MD 01/20/2016 11:09:35 AM This report has been signed electronically. Number of Addenda: 0 Note Initiated On: 01/20/2016 10:50 AM      [facility]

## 2016-01-20 NOTE — Progress Notes (Signed)
[place] at [place] NAME: Stanley [name]    MR#:  [id number]  DATE OF BIRTH:  30-Sep-1960  SUBJECTIVE: Admitted for rectal bleeding. EGD showed chronic gastritis. Hemoglobin stable at 13.6.   CHIEF COMPLAINT:   Chief Complaint  Patient presents with  . Rectal Bleeding    REVIEW OF SYSTEMS:   ROS CONSTITUTIONAL: No fever, fatigue or weakness.  EYES: No blurred or double vision.  EARS, NOSE, AND THROAT: No tinnitus or ear pain.  RESPIRATORY: No cough, shortness of breath, wheezing or hemoptysis.  CARDIOVASCULAR: No chest pain, orthopnea, edema.  GASTROINTESTINAL: No nausea, vomiting, diarrhea or abdominal pain.  GENITOURINARY: No dysuria, hematuria.  ENDOCRINE: No polyuria, nocturia,  HEMATOLOGY: No anemia, easy bruising or bleeding SKIN: No rash or lesion. MUSCULOSKELETAL: No joint pain or arthritis.   NEUROLOGIC: No tingling, numbness, weakness.  PSYCHIATRY: No anxiety or depression.   DRUG ALLERGIES:  No Known Allergies  VITALS:  Blood pressure 117/79, pulse (!) 56, temperature 97.7 F (36.5 C), temperature source Oral, resp. rate 14, height 6\' 3"  (1.905 m), weight 81.6 kg (180 lb), SpO2 100 %.  PHYSICAL EXAMINATION:  GENERAL:  55 y.o.-year-old patient lying in the bed with no acute distress.  EYES: Pupils equal, round, reactive to light and accommodation. No scleral icterus. Extraocular muscles intact.  HEENT: Head atraumatic, normocephalic. Oropharynx and nasopharynx clear.  NECK:  Supple, no jugular venous distention. No thyroid enlargement, no tenderness.  LUNGS: Normal breath sounds bilaterally, no wheezing, rales,rhonchi or crepitation. No use of accessory muscles of respiration.  CARDIOVASCULAR: S1, S2 normal. No murmurs, rubs, or gallops.  ABDOMEN: Soft, nontender, nondistended. Bowel sounds present. No organomegaly or mass.  EXTREMITIES: No pedal edema, cyanosis, or clubbing.  NEUROLOGIC: Cranial nerves II  through XII are intact. Muscle strength 5/5 in all extremities. Sensation intact. Gait not checked.  PSYCHIATRIC: The patient is alert and oriented x 3.  SKIN: No obvious rash, lesion, or ulcer.    LABORATORY PANEL:   CBC  Recent Labs Lab 01/20/16 0440  WBC 6.2  HGB 13.6  HCT 42.4  PLT 170   ------------------------------------------------------------------------------------------------------------------  Chemistries   Recent Labs Lab 01/19/16 1335 01/20/16 0440  NA 137 139  K 3.9 4.9  CL 101 102  CO2 26 24  GLUCOSE 151* 87  BUN 34* 39*  CREATININE 7.47* 9.09*  CALCIUM 8.7* 8.9  AST 30  --   ALT 32  --   ALKPHOS 184*  --   BILITOT 1.4*  --    ------------------------------------------------------------------------------------------------------------------  Cardiac Enzymes No results for input(s): TROPONINI in the last 168 hours. ------------------------------------------------------------------------------------------------------------------  RADIOLOGY:  Dg Chest 2 View  Result Date: 01/19/2016 CLINICAL DATA:  Cough EXAM: CHEST  2 VIEW COMPARISON:  05/04/2015 FINDINGS: Borderline cardiomegaly. Status post CABG. Central mild vascular congestion without convincing pulmonary edema. No segmental infiltrate. IMPRESSION: Borderline cardiomegaly. Central vascular congestion without convincing pulmonary edema. No segmental infiltrate. Electronically Signed   By: Lahoma Crocker M.D.   On: 01/19/2016 15:38    EKG:   Orders placed or performed during the hospital encounter of 09/24/15  . ED EKG  . ED EKG  . EKG 12-Lead  . EKG 12-Lead    ASSESSMENT AND PLAN:  #1 rectal bleeding: Stable hemoglobin,  chronic gastritis. Continue PPIs, advance diet low-sodium ADA diet. #2 CAD: Stable 3.alcoholic liver cirrhosis; #4 diabetic neuropathy ESRD on hemodialysis 5. Essential hypertension: Controlled, continue labetalol, amlodipine  All the records are reviewed and  case  discussed with Care Management/Social Workerr. Management plans discussed with the patient, family and they are in agreement.  CODE STATUS: Stable  TOTAL TIME TAKING CARE OF THIS PATIENT: 72minutes.   POSSIBLE D/C IN 1-2  DAYS, DEPENDING ON CLINICAL CONDITION.   Epifanio Lesches M.D on 01/20/2016 at 12:06 PM  Between 7am to 6pm - Pager - [phone]  After 6pm go to [email or web address] - password [email or web address] [place] [facility]  Office  [phone]  CC: Primary care physician; Dagoberto Ligas, MD   Note: This dictation was prepared with Dragon dictation along with smaller phrase technology. Any transcriptional errors that result from this process are unintentional.

## 2016-01-21 ENCOUNTER — Encounter: Payer: Self-pay | Admitting: Gastroenterology

## 2016-01-21 DIAGNOSIS — R140 Abdominal distension (gaseous): Secondary | ICD-10-CM | POA: Diagnosis not present

## 2016-01-21 DIAGNOSIS — K2901 Acute gastritis with bleeding: Secondary | ICD-10-CM | POA: Diagnosis not present

## 2016-01-21 DIAGNOSIS — K922 Gastrointestinal hemorrhage, unspecified: Secondary | ICD-10-CM | POA: Diagnosis not present

## 2016-01-21 DIAGNOSIS — N186 End stage renal disease: Secondary | ICD-10-CM | POA: Diagnosis not present

## 2016-01-21 DIAGNOSIS — I10 Essential (primary) hypertension: Secondary | ICD-10-CM | POA: Diagnosis not present

## 2016-01-21 LAB — RENAL FUNCTION PANEL
ALBUMIN: 3.1 g/dL — AB (ref 3.5–5.0)
ANION GAP: 12 (ref 5–15)
BUN: 58 mg/dL — ABNORMAL HIGH (ref 6–20)
CALCIUM: 8.5 mg/dL — AB (ref 8.9–10.3)
CO2: 21 mmol/L — ABNORMAL LOW (ref 22–32)
Chloride: 102 mmol/L (ref 101–111)
Creatinine, Ser: 10.76 mg/dL — ABNORMAL HIGH (ref 0.61–1.24)
GFR calc non Af Amer: 5 mL/min — ABNORMAL LOW (ref >60)
GFR, EST AFRICAN AMERICAN: 5 mL/min — AB (ref >60)
GLUCOSE: 107 mg/dL — AB (ref 65–99)
PHOSPHORUS: 4.1 mg/dL (ref 2.5–4.6)
Potassium: 5.3 mmol/L — ABNORMAL HIGH (ref 3.5–5.1)
SODIUM: 135 mmol/L (ref 135–145)

## 2016-01-21 LAB — CBC
HCT: 41.8 % (ref 40.0–52.0)
HEMATOCRIT: 40.6 % (ref 40.0–52.0)
HEMOGLOBIN: 13.3 g/dL (ref 13.0–18.0)
HEMOGLOBIN: 13.5 g/dL (ref 13.0–18.0)
MCH: 30.7 pg (ref 26.0–34.0)
MCH: 30.7 pg (ref 26.0–34.0)
MCHC: 32.8 g/dL (ref 32.0–36.0)
MCHC: 32.4 g/dL (ref 32.0–36.0)
MCV: 93.5 fL (ref 80.0–100.0)
MCV: 94.8 fL (ref 80.0–100.0)
Platelets: 178 10*3/uL (ref 150–440)
Platelets: 180 10*3/uL (ref 150–440)
RBC: 4.34 MIL/uL — AB (ref 4.40–5.90)
RBC: 4.41 MIL/uL (ref 4.40–5.90)
RDW: 16.5 % — ABNORMAL HIGH (ref 11.5–14.5)
RDW: 17.3 % — ABNORMAL HIGH (ref 11.5–14.5)
WBC: 5.8 10*3/uL (ref 3.8–10.6)
WBC: 6.5 10*3/uL (ref 3.8–10.6)

## 2016-01-21 LAB — SURGICAL PATHOLOGY

## 2016-01-21 MED ORDER — SODIUM CHLORIDE 0.9 % IV SOLN: 100.0000 mL | INTRAVENOUS | Status: DC | PRN

## 2016-01-21 MED ORDER — OXYCODONE HCL 5 MG PO TABS
5.0000 mg | ORAL_TABLET | Freq: Four times a day (QID) | ORAL | Status: DC | PRN
  Administered 2016-01-22: 5 mg via ORAL
  Filled 2016-01-21: qty 1

## 2016-01-21 MED ORDER — HEPARIN SODIUM (PORCINE) 1000 UNIT/ML DIALYSIS
1000.0000 [IU] | INTRAMUSCULAR | Status: DC | PRN
  Filled 2016-01-21: qty 1

## 2016-01-21 MED ORDER — LIDOCAINE-PRILOCAINE 2.5-2.5 % EX CREA
1.0000 "application " | TOPICAL_CREAM | CUTANEOUS | Status: DC | PRN
  Filled 2016-01-21: qty 5

## 2016-01-21 MED ORDER — PENTAFLUOROPROP-TETRAFLUOROETH EX AERO
1.0000 "application " | INHALATION_SPRAY | CUTANEOUS | Status: DC | PRN
  Filled 2016-01-21: qty 30

## 2016-01-21 MED ORDER — LABETALOL HCL 100 MG PO TABS
100.0000 mg | ORAL_TABLET | Freq: Every day | ORAL | Status: DC
  Administered 2016-01-22: 100 mg via ORAL
  Filled 2016-01-21: qty 1

## 2016-01-21 MED ORDER — ALTEPLASE 2 MG IJ SOLR: 2.0000 mg | Freq: Once | INTRAMUSCULAR | Status: DC | PRN

## 2016-01-21 MED ORDER — LIDOCAINE HCL (PF) 1 % IJ SOLN
5.0000 mL | INTRAMUSCULAR | Status: DC | PRN
  Filled 2016-01-21: qty 5

## 2016-01-21 NOTE — Progress Notes (Signed)
Pre Dialysis 

## 2016-01-21 NOTE — Progress Notes (Signed)
[place] at [place] NAME: Stanley [name]    MR#:  [id number]  DATE OF BIRTH:  [birth year]  SUBJECTIVE: Status post paracentesis yesterday, 3 and half liters of fluid drained. I'm waiting for ascetic fluid culture data. Complains of leg pain.   CHIEF COMPLAINT:   Chief Complaint  Patient presents with  . Rectal Bleeding    REVIEW OF SYSTEMS:   ROS CONSTITUTIONAL: No fever, fatigue or weakness.  EYES: No blurred or double vision.  EARS, NOSE, AND THROAT: No tinnitus or ear pain.  RESPIRATORY: No cough, shortness of breath, wheezing or hemoptysis.  CARDIOVASCULAR: No chest pain, orthopnea, edema.  GASTROINTESTINAL: No nausea, vomiting, diarrhea or abdominal pain.  GENITOURINARY: No dysuria, hematuria.  ENDOCRINE: No polyuria, nocturia,  HEMATOLOGY: No anemia, easy bruising or bleeding SKIN: No rash or lesion. MUSCULOSKELETAL: No joint pain or arthritis.   NEUROLOGIC: No tingling, numbness, weakness.  PSYCHIATRY: No anxiety or depression.   DRUG ALLERGIES:  No Known Allergies  VITALS:  Blood pressure 130/73, pulse 62, temperature 97.5 F (36.4 C), temperature source Oral, resp. rate 18, height 6\' 3"  (1.905 m), weight 81.6 kg (180 lb), SpO2 100 %.  PHYSICAL EXAMINATION:  GENERAL:  55 y.o.-year-old patient lying in the bed with no acute distress.  EYES: Pupils equal, round, reactive to light and accommodation. No scleral icterus. Extraocular muscles intact.  HEENT: Head atraumatic, normocephalic. Oropharynx and nasopharynx clear.  NECK:  Supple, no jugular venous distention. No thyroid enlargement, no tenderness.  LUNGS: Normal breath sounds bilaterally, no wheezing, rales,rhonchi or crepitation. No use of accessory muscles of respiration.  CARDIOVASCULAR: S1, S2 normal. No murmurs, rubs, or gallops.  ABDOMEN: Soft, nontender, nondistended. Bowel sounds present. No organomegaly or mass.  EXTREMITIES: No pedal edema, cyanosis,  or clubbing.  NEUROLOGIC: Cranial nerves II through XII are intact. Muscle strength 5/5 in all extremities. Sensation intact. Gait not checked.  PSYCHIATRIC: The patient is alert and oriented x 3.  SKIN: No obvious rash, lesion, or ulcer.    LABORATORY PANEL:   CBC  Recent Labs Lab 01/21/16 1019  WBC 5.8  HGB 13.3  HCT 40.6  PLT 178   ------------------------------------------------------------------------------------------------------------------  Chemistries   Recent Labs Lab 01/19/16 1335 01/20/16 0440  NA 137 139  K 3.9 4.9  CL 101 102  CO2 26 24  GLUCOSE 151* 87  BUN 34* 39*  CREATININE 7.47* 9.09*  CALCIUM 8.7* 8.9  AST 30  --   ALT 32  --   ALKPHOS 184*  --   BILITOT 1.4*  --    ------------------------------------------------------------------------------------------------------------------  Cardiac Enzymes No results for input(s): TROPONINI in the last 168 hours. ------------------------------------------------------------------------------------------------------------------  RADIOLOGY:  Dg Chest 2 View  Result Date: 01/19/2016 CLINICAL DATA:  Cough EXAM: CHEST  2 VIEW COMPARISON:  05/04/2015 FINDINGS: Borderline cardiomegaly. Status post CABG. Central mild vascular congestion without convincing pulmonary edema. No segmental infiltrate. IMPRESSION: Borderline cardiomegaly. Central vascular congestion without convincing pulmonary edema. No segmental infiltrate. Electronically Signed   By: Lahoma Crocker M.D.   On: 01/19/2016 15:38   US Paracentesis  Result Date: 01/20/2016 INDICATION: Ascites EXAM: ULTRASOUND GUIDED  PARACENTESIS MEDICATIONS: None. COMPLICATIONS: None immediate. PROCEDURE: Informed written consent was obtained from the patient after a discussion of the risks, benefits and alternatives to treatment. A timeout was performed prior to the initiation of the procedure. Initial ultrasound scanning demonstrates a large amount of ascites within the  right mid abdomen . The right abdomen was  prepped and draped in the usual sterile fashion. 1% lidocaine was used for local anesthesia. Following this, a 6 Fr Safe-T-Centesis catheter was introduced. An ultrasound image was saved for documentation purposes. The paracentesis was performed. The catheter was removed and a dressing was applied. The patient tolerated the procedure well without immediate post procedural complication. FINDINGS: A total of approximately 3.8 L of clear yellow fluid was removed. Samples were sent to the laboratory as requested by the clinical team. IMPRESSION: Successful ultrasound-guided paracentesis yielding 3.8 liters of peritoneal fluid. Electronically Signed   By: Inez Catalina M.D.   On: 01/20/2016 15:14    EKG:   Orders placed or performed during the hospital encounter of 09/24/15  . ED EKG  . ED EKG  . EKG 12-Lead  . EKG 12-Lead    ASSESSMENT AND PLAN:  #1 rectal bleeding: Stable hemoglobin,  chronic gastritis. Continue PPIs, advance diet low-sodium ADA diet. #2 CAD: Stable 3.alcoholic liver cirrhosis,ASCITES;s/p paracentesis,3,1litres drained,waiting for culture data before discharging #4 diabetic neuropathy ESRD on hemodialysis 5. Essential hypertension: bp soft.decreased labetalol Likely d/c am All the records are reviewed and case discussed with Care Management/Social Workerr. Management plans discussed with the patient, family and they are in agreement.  CODE STATUS: Stable  TOTAL TIME TAKING CARE OF THIS PATIENT: 71minutes.   POSSIBLE D/C IN 1-2  DAYS, DEPENDING ON CLINICAL CONDITION.   Epifanio Lesches M.D on 01/21/2016 at 1:39 PM  Between 7am to 6pm - Pager - [phone]  After 6pm go to [email or web address] - password [email or web address] [place] [facility]  Office  [phone]  CC: Primary care physician; Dagoberto Ligas, MD   Note: This dictation was prepared with Dragon dictation along with smaller phrase technology. Any transcriptional  errors that result from this process are unintentional.

## 2016-01-21 NOTE — Progress Notes (Signed)
Dialysis started 

## 2016-01-21 NOTE — Progress Notes (Signed)
Dr Lavetta Nielsen notified that this patient is a hemodialysis patient.  Nephrology consult ordered.  Dr Holley Raring notified of consult

## 2016-01-22 DIAGNOSIS — K2901 Acute gastritis with bleeding: Secondary | ICD-10-CM | POA: Diagnosis not present

## 2016-01-22 DIAGNOSIS — E875 Hyperkalemia: Secondary | ICD-10-CM | POA: Diagnosis not present

## 2016-01-22 DIAGNOSIS — R140 Abdominal distension (gaseous): Secondary | ICD-10-CM | POA: Diagnosis not present

## 2016-01-22 DIAGNOSIS — K922 Gastrointestinal hemorrhage, unspecified: Secondary | ICD-10-CM | POA: Diagnosis not present

## 2016-01-22 DIAGNOSIS — I10 Essential (primary) hypertension: Secondary | ICD-10-CM | POA: Diagnosis not present

## 2016-01-22 DIAGNOSIS — N186 End stage renal disease: Secondary | ICD-10-CM | POA: Diagnosis not present

## 2016-01-22 DIAGNOSIS — N2581 Secondary hyperparathyroidism of renal origin: Secondary | ICD-10-CM | POA: Diagnosis not present

## 2016-01-22 DIAGNOSIS — D631 Anemia in chronic kidney disease: Secondary | ICD-10-CM | POA: Diagnosis not present

## 2016-01-22 MED ORDER — LABETALOL HCL 100 MG PO TABS: 100.0000 mg | ORAL_TABLET | Freq: Every day | ORAL | 0 refills | Status: DC

## 2016-01-22 MED ORDER — PANTOPRAZOLE SODIUM 40 MG PO TBEC: 40.0000 mg | DELAYED_RELEASE_TABLET | Freq: Two times a day (BID) | ORAL | 0 refills | Status: DC

## 2016-01-22 NOTE — Progress Notes (Signed)
Post dialysis 

## 2016-01-22 NOTE — Progress Notes (Signed)
[place] [facility] [place] NAME: Stanley [name]    MR#:  [id number]  DATE OF BIRTH:  19-Mar-1960  SUBJECTIVE: Patient is stable, discharged home today.   CHIEF COMPLAINT:   Chief Complaint  Patient presents with  . Rectal Bleeding    REVIEW OF SYSTEMS:   ROS CONSTITUTIONAL: No fever, fatigue or weakness.  EYES: No blurred or double vision.  EARS, NOSE, AND THROAT: No tinnitus or ear pain.  RESPIRATORY: No cough, shortness of breath, wheezing or hemoptysis.  CARDIOVASCULAR: No chest pain, orthopnea, edema.  GASTROINTESTINAL: No nausea, vomiting, diarrhea or abdominal pain.  GENITOURINARY: No dysuria, hematuria.  ENDOCRINE: No polyuria, nocturia,  HEMATOLOGY: No anemia, easy bruising or bleeding SKIN: No rash or lesion. MUSCULOSKELETAL: No joint pain or arthritis.   NEUROLOGIC: No tingling, numbness, weakness.  PSYCHIATRY: No anxiety or depression.   DRUG ALLERGIES:  No Known Allergies  VITALS:  Blood pressure 120/75, pulse 63, temperature 98.5 F (36.9 C), temperature source Oral, resp. rate 18, height 6\' 3"  (1.905 m), weight 78.3 kg (172 lb 9.9 oz), SpO2 98 %.  PHYSICAL EXAMINATION:  GENERAL:  55 y.o.-year-old patient lying in the bed with no acute distress.  EYES: Pupils equal, round, reactive to light and accommodation. No scleral icterus. Extraocular muscles intact.  HEENT: Head atraumatic, normocephalic. Oropharynx and nasopharynx clear.  NECK:  Supple, no jugular venous distention. No thyroid enlargement, no tenderness.  LUNGS: Normal breath sounds bilaterally, no wheezing, rales,rhonchi or crepitation. No use of accessory muscles of respiration.  CARDIOVASCULAR: S1, S2 normal. No murmurs, rubs, or gallops.  ABDOMEN: Soft, nontender, nondistended. Bowel sounds present. No organomegaly or mass.  EXTREMITIES: No pedal edema, cyanosis, or clubbing.  NEUROLOGIC: Cranial nerves II through XII are intact. Muscle strength 5/5 in  all extremities. Sensation intact. Gait not checked.  PSYCHIATRIC: The patient is alert and oriented x 3.  SKIN: No obvious rash, lesion, or ulcer.    LABORATORY PANEL:   CBC  Recent Labs Lab 01/21/16 2127  WBC 6.5  HGB 13.5  HCT 41.8  PLT 180   ------------------------------------------------------------------------------------------------------------------  Chemistries   Recent Labs Lab 01/19/16 1335  01/21/16 2127  NA 137  < > 135  K 3.9  < > 5.3*  CL 101  < > 102  CO2 26  < > 21*  GLUCOSE 151*  < > 107*  BUN 34*  < > 58*  CREATININE 7.47*  < > 10.76*  CALCIUM 8.7*  < > 8.5*  AST 30  --   --   ALT 32  --   --   ALKPHOS 184*  --   --   BILITOT 1.4*  --   --   < > = values in this interval not displayed. ------------------------------------------------------------------------------------------------------------------  Cardiac Enzymes No results for input(s): TROPONINI in the last 168 hours. ------------------------------------------------------------------------------------------------------------------  RADIOLOGY:  US Paracentesis  Result Date: 01/20/2016 INDICATION: Ascites EXAM: ULTRASOUND GUIDED  PARACENTESIS MEDICATIONS: None. COMPLICATIONS: None immediate. PROCEDURE: Informed written consent was obtained from the patient after a discussion of the risks, benefits and alternatives to treatment. A timeout was performed prior to the initiation of the procedure. Initial ultrasound scanning demonstrates a large amount of ascites within the right mid abdomen . The right abdomen was prepped and draped in the usual sterile fashion. 1% lidocaine was used for local anesthesia. Following this, a 6 Fr Safe-T-Centesis catheter was introduced. An ultrasound image was saved for documentation purposes. The paracentesis was performed.  The catheter was removed and a dressing was applied. The patient tolerated the procedure well without immediate post procedural complication.  FINDINGS: A total of approximately 3.8 L of clear yellow fluid was removed. Samples were sent to the laboratory as requested by the clinical team. IMPRESSION: Successful ultrasound-guided paracentesis yielding 3.8 liters of peritoneal fluid. Electronically Signed   By: Inez Catalina M.D.   On: 01/20/2016 15:14    EKG:   Orders placed or performed during the hospital encounter of 09/24/15  . ED EKG  . ED EKG  . EKG 12-Lead  . EKG 12-Lead    ASSESSMENT AND PLAN:  #1 rectal bleeding: Stable hemoglobin,  chronic gastritis. Continue PPIs, advance diet low-sodium ADA diet. #2 CAD: Stable 3.alcoholic liver cirrhosis,ASCITES;s/p paracentesis,3,1litres drained, cultures are  negative. Stable for discharge today . #4 diabetic neuropathy ESRD on hemodialysis 5. Essential hypertension: bp soft.decreased labetalol Home today All the records are reviewed and case discussed with Care Management/Social Workerr. Management plans discussed with the patient, family and they are in agreement.  CODE STATUS: Stable  TOTAL TIME TAKING CARE OF THIS PATIENT: 68minutes.      Epifanio Lesches M.D on 01/22/2016 at 10:41 AM  Between 7am to 6pm - Pager - [phone]  After 6pm go to [email or web address] - password [email or web address] [facility]  Office  [phone]  CC: Primary care physician; Dagoberto Ligas, MD   Note: This dictation was prepared with Dragon dictation along with smaller phrase technology. Any transcriptional errors that result from this process are unintentional.

## 2016-01-22 NOTE — Progress Notes (Signed)
Dialysis complete

## 2016-01-22 NOTE — Progress Notes (Signed)
01/22/2016  11:15  [name] to be D/C'd Home per MD order.  Discussed prescriptions and follow up appointments with the patient. Prescriptions given to patient, medication list explained in detail. Pt verbalized understanding.    Medication List    STOP taking these medications   omeprazole 20 MG capsule Commonly known as:  PRILOSEC     TAKE these medications   amLODipine 5 MG tablet Commonly known as:  NORVASC Take 5 mg by mouth daily.   aspirin EC 81 MG tablet Take 81 mg by mouth at bedtime.   b complex-vitamin c-folic acid 0.8 MG Tabs tablet Take 1 tablet by mouth daily.   calcium acetate 667 MG capsule Commonly known as:  PHOSLO Take 2,001 mg by mouth 3 (three) times daily with meals.   cinacalcet 30 MG tablet Commonly known as:  SENSIPAR Take 30 mg by mouth daily with supper.   gabapentin 300 MG capsule Commonly known as:  NEURONTIN Take 1 capsule (300 mg total) by mouth daily. What changed:  when to take this   labetalol 100 MG tablet Commonly known as:  NORMODYNE Take 1 tablet (100 mg total) by mouth daily. What changed:  medication strength  how much to take   pantoprazole 40 MG tablet Commonly known as:  PROTONIX Take 1 tablet (40 mg total) by mouth 2 (two) times daily.       Vitals:   01/22/16 0427 01/22/16 1045  BP: 120/75 117/76  Pulse: 63   Resp: 18   Temp: 98.5 F (36.9 C)     Skin clean, dry and intact without evidence of skin break down, no evidence of skin tears noted. IV catheter discontinued intact. Site without signs and symptoms of complications. Dressing and pressure applied. Pt denies pain at this time. No complaints noted.  An After Visit Summary was printed and given to the patient. Patient escorted via [place], and D/C home via private auto.  Stanley [name]

## 2016-01-22 NOTE — Progress Notes (Signed)
[facility]  ROUNDING NOTE   Subjective:  Pt seen at bedside.  Had hemodialysis yesterday.  Overall feeling better as compared to admission.   Objective:  Vital signs in last 24 hours:  Temp:  [97.5 F (36.4 C)-98.5 F (36.9 C)] 98.5 F (36.9 C) (11/10 0427) Pulse Rate:  [57-70] 63 (11/10 0427) Resp:  [13-20] 18 (11/10 0427) BP: (117-156)/(70-91) 117/76 (11/10 1045) SpO2:  [95 %-100 %] 98 % (11/10 0427) Weight:  [78.3 kg (172 lb 9.9 oz)-80.4 kg (177 lb 4 oz)] 78.3 kg (172 lb 9.9 oz) (11/10 0108)  Weight change: -1.247 kg (-2 lb 12 oz) Filed Weights   01/20/16 1022 01/21/16 2258 01/22/16 0108  Weight: 81.6 kg (180 lb) 80.4 kg (177 lb 4 oz) 78.3 kg (172 lb 9.9 oz)    Intake/Output: I/O last 3 completed shifts: In: 480 [P.O.:480] Out: 2000 [Other:2000]   Intake/Output this shift:  Total I/O In: 240 [P.O.:240] Out: -   Physical Exam: General: No acute distress  Head: Normocephalic, atraumatic. Moist oral mucosal membranes  Eyes: Anicteric  Neck: Supple, trachea midline  Lungs:  Clear to auscultation, normal effort  Heart: S1S2 no rubs  Abdomen:  Soft, nontender, bowel sounds present  Extremities: no peripheral edema.  Neurologic: Nonfocal, moving all four extremities  Skin: No lesions  Access: Right upper extremity access    Basic Metabolic Panel:  Recent Labs Lab 01/19/16 1335 01/20/16 0440 01/21/16 2127  NA 137 139 135  K 3.9 4.9 5.3*  CL 101 102 102  CO2 26 24 21*  GLUCOSE 151* 87 107*  BUN 34* 39* 58*  CREATININE 7.47* 9.09* 10.76*  CALCIUM 8.7* 8.9 8.5*  PHOS  --   --  4.1    Liver Function Tests:  Recent Labs Lab 01/19/16 1335 01/21/16 2127  AST 30  --   ALT 32  --   ALKPHOS 184*  --   BILITOT 1.4*  --   PROT 7.3  --   ALBUMIN 3.7 3.1*    Recent Labs Lab 01/19/16 1335  LIPASE 19   No results for input(s): AMMONIA in the last 168 hours.  CBC:  Recent Labs Lab 01/19/16 1335 01/20/16 0440 01/21/16 1019  01/21/16 2127  WBC 9.2 6.2 5.8 6.5  NEUTROABS 7.5*  --   --   --   HGB 13.7 13.6 13.3 13.5  HCT 40.8 42.4 40.6 41.8  MCV 92.8 95.2 93.5 94.8  PLT 190 170 178 180    Cardiac Enzymes: No results for input(s): CKTOTAL, CKMB, CKMBINDEX, TROPONINI in the last 168 hours.  BNP: Invalid input(s): POCBNP  CBG: No results for input(s): GLUCAP in the last 168 hours.  Microbiology: Results for orders placed or performed during the hospital encounter of 01/19/16  Culture, body fluid-bottle     Status: None (Preliminary result)   Collection Time: 01/20/16  1:01 PM  Result Value Ref Range Status   Specimen Description FLUID ABDOMEN  Final   Special Requests BOTTLES DRAWN AEROBIC AND ANAEROBIC 10CC  Final   Culture   Final    NO GROWTH < 24 HOURS Performed at [facility]    Report Status PENDING  Incomplete  Gram stain     Status: None   Collection Time: 01/20/16  1:01 PM  Result Value Ref Range Status   Specimen Description FLUID ABDOMEN  Final   Special Requests NONE  Final   Gram Stain   Final    WBC PRESENT, PREDOMINANTLY MONONUCLEAR NO  WBC SEEN CYTOSPIN Performed at [facility]    Report Status 01/20/2016 FINAL  Final    Coagulation Studies:  Recent Labs  01/19/16 1335  LABPROT 15.7*  INR 1.24    Urinalysis: No results for input(s): COLORURINE, LABSPEC, PHURINE, GLUCOSEU, HGBUR, BILIRUBINUR, KETONESUR, PROTEINUR, UROBILINOGEN, NITRITE, LEUKOCYTESUR in the last 72 hours.  Invalid input(s): APPERANCEUR    Imaging: US Paracentesis  Result Date: 01/20/2016 INDICATION: Ascites EXAM: ULTRASOUND GUIDED  PARACENTESIS MEDICATIONS: None. COMPLICATIONS: None immediate. PROCEDURE: Informed written consent was obtained from the patient after a discussion of the risks, benefits and alternatives to treatment. A timeout was performed prior to the initiation of the procedure. Initial ultrasound scanning demonstrates a large amount of ascites within the right mid  abdomen . The right abdomen was prepped and draped in the usual sterile fashion. 1% lidocaine was used for local anesthesia. Following this, a 6 Fr Safe-T-Centesis catheter was introduced. An ultrasound image was saved for documentation purposes. The paracentesis was performed. The catheter was removed and a dressing was applied. The patient tolerated the procedure well without immediate post procedural complication. FINDINGS: A total of approximately 3.8 L of clear yellow fluid was removed. Samples were sent to the laboratory as requested by the clinical team. IMPRESSION: Successful ultrasound-guided paracentesis yielding 3.8 liters of peritoneal fluid. Electronically Signed   By: Inez Catalina M.D.   On: 01/20/2016 15:14     Medications:    . amLODipine  5 mg Oral QHS  . calcium acetate  2,001 mg Oral TID WC  . cinacalcet  30 mg Oral Q supper  . gabapentin  300 mg Oral QHS  . labetalol  100 mg Oral Daily  . multivitamin  1 tablet Oral QHS  . nicotine  7 mg Transdermal Daily  . pantoprazole  40 mg Oral BID   sodium chloride, sodium chloride, acetaminophen **OR** acetaminophen, alteplase, diphenhydrAMINE, heparin, lidocaine (PF), lidocaine-prilocaine, oxyCODONE, pentafluoroprop-tetrafluoroeth  Assessment/ Plan:  55 y.o. male  with coronary artery disease, hypertension, peripheral vascular disease, hyperlipidemia, anemia  TTS [facility] [address] [facility]  1. End Stage Renal Disease with hyperkalemia:  Patient underwent hemodialysis yesterday. Potassium was 5.3 which was slightly high. I advised the patient to go to his regular schedule dialysis tomorrow.  2. Hypertension: Blood pressure under good control at the moment at 117/76. Patient will continue current doses of amlodipine and labetalol.  3. Secondary Hyperparathyroidism: Phosphorus was 4.1 yesterday. Continue cinacalcet as well as PhosLo.  4. Anemia of chronic kidney disease:  Hemoglobin currently 13.5. No indication for Procrit at  the moment.    LOS: 0 Letta Cargile 11/10/201711:36 AM

## 2016-01-23 DIAGNOSIS — Z992 Dependence on renal dialysis: Secondary | ICD-10-CM | POA: Diagnosis not present

## 2016-01-23 DIAGNOSIS — N186 End stage renal disease: Secondary | ICD-10-CM | POA: Diagnosis not present

## 2016-01-23 DIAGNOSIS — N2581 Secondary hyperparathyroidism of renal origin: Secondary | ICD-10-CM | POA: Diagnosis not present

## 2016-01-24 NOTE — Discharge Summary (Signed)
Stanley [name], is a 55 y.o. male  DOB [birth year]  MRN [id number].  Admission date:  01/19/2016  Admitting Physician  Loletha Grayer, MD  Discharge Date:  01/22/2016   Primary MD  LATEEF, Mora Appl, MD  Recommendations for primary care physician for things to follow:   Patient's primary doctor is Dr. Mickel Duhamel advised him to follow with her, patient has coming appointment on November 14 with her.   Admission Diagnosis  Cough [R05] Abdominal distension [R14.0] Gastrointestinal hemorrhage, unspecified gastrointestinal hemorrhage type [K92.2]   Discharge Diagnosis  Cough [R05] Abdominal distension [R14.0] Gastrointestinal hemorrhage, unspecified gastrointestinal hemorrhage type [K92.2]    Active Problems:   Upper GI bleed      Past Medical History:  Diagnosis Date  . Alcohol abuse   . Cirrhosis ([place])   . Coronary artery disease   . Diabetic peripheral neuropathy ([place])   . Drug abuse   . End stage renal disease on dialysis [facility]) NEPHROLOGIST-   DR [facility]  IN [place]   HEMODIALYSIS --   TUES/  THURS/  SAT  . GERD (gastroesophageal reflux disease)   . Hypertension   . Suicidal ideation    & HOMICIDAL IDEATION --  06-16-2013   ADMITTED TO BEHAVIOR HEALTH    Past Surgical History:  Procedure Laterality Date  . CORONARY ANGIOPLASTY  ?   PT UNABLE TO TELL IF  BEFORE OR AFTER  CABG  . CORONARY ARTERY BYPASS GRAFT  2008  ([place] , [place])   3 VESSEL  . DIALYSIS FISTULA CREATION  LAST SURGERY  APPOX  2008  . ESOPHAGOGASTRODUODENOSCOPY N/A 05/07/2015   Procedure: ESOPHAGOGASTRODUODENOSCOPY (EGD);  Surgeon: Hulen Luster, MD;  Location: [facility] ENDOSCOPY;  Service: Endoscopy;  Laterality: N/A;  . ESOPHAGOGASTRODUODENOSCOPY (EGD) WITH PROPOFOL N/A 05/17/2015   Procedure: ESOPHAGOGASTRODUODENOSCOPY (EGD) WITH PROPOFOL;   Surgeon: [name], MD;  Location: [facility] ENDOSCOPY;  Service: Endoscopy;  Laterality: N/A;  . ESOPHAGOGASTRODUODENOSCOPY (EGD) WITH PROPOFOL N/A 01/20/2016   Procedure: ESOPHAGOGASTRODUODENOSCOPY (EGD) WITH PROPOFOL;  Surgeon: [name], MD;  Location: [facility] ENDOSCOPY;  Service: Endoscopy;  Laterality: N/A;  . MANDIBULAR HARDWARE REMOVAL N/A 07/29/2013   Procedure: REMOVAL OF ARCH BARS;  Surgeon: Theodoro Kos, DO;  Location: [place];  Service: Plastics;  Laterality: N/A;  . ORIF MANDIBULAR FRACTURE N/A 06/05/2013   Procedure: REPAIR OF MANDIBULAR FRACTURE x 2 with maxillo-mandibular fixation ;  Surgeon: Theodoro Kos, DO;  Location: [place];  Service: Plastics;  Laterality: N/A;       History of present illness and  Hospital Course:     Kindly see H&P for history of present illness and admission details, please review complete Labs, Consult reports and Test reports for all details in brief  HPI  from the history and physical done on the day of admission 55 year old male patient with history of angina due to some stomach on last endoscopy comes in because of blood in the stool, possible melena, coughs up a little blood. Admitted  for upper GI bleed with hematemesis, possible melena.   Hospital Course  #1 upper GI bleed with hematemesis: Started on PPIs, kept nothing by mouth, GI is consulted, aspirin is stopped. Patient's serial hemoglobins checked. Patient hemoglobin stayed stable around 13.3. Hematocrit transfusion. Seen by gastroenterology, had EGD, sent upper endoscopy showed chronic gastritis, normal esophagus, normal  Duodenum, advised to continue PPIs, follow-up with gastroenterology Dr. [name] in 4 weeks. #2 ALCOHOLIC liver cirrhosis: Ascites, patient had sono guided paracentesis, 3.8 L of  clear yellow fluid removed. Acetic fluid cultures did not show any growth. 3. ESRD; on hemodialysis. Tuesday, Thursday, Saturday. #4 essential hypertension: Blood pressure on the  lower side ,so decrease the labetalol dose.  5. diabetic neuropathy #6 drug abuse   Discharge Condition:    Follow UP  Follow-up Information    SINGH,HARMEET, MD Follow up in 1 week(s).   Specialty:  Internal Medicine Why:  SEE YOU AT DIALYSIS Contact information: [place] [address] [phone]             Discharge Instructions  and  Discharge Medications        Medication List    STOP taking these medications   omeprazole 20 MG capsule Commonly known as:  PRILOSEC     TAKE these medications   amLODipine 5 MG tablet Commonly known as:  NORVASC Take 5 mg by mouth daily.   aspirin EC 81 MG tablet Take 81 mg by mouth at bedtime.   b complex-vitamin c-folic acid 0.8 MG Tabs tablet Take 1 tablet by mouth daily.   calcium acetate 667 MG capsule Commonly known as:  PHOSLO Take 2,001 mg by mouth 3 (three) times daily with meals.   cinacalcet 30 MG tablet Commonly known as:  SENSIPAR Take 30 mg by mouth daily with supper.   gabapentin 300 MG capsule Commonly known as:  NEURONTIN Take 1 capsule (300 mg total) by mouth daily. What changed:  when to take this   labetalol 100 MG tablet Commonly known as:  NORMODYNE Take 1 tablet (100 mg total) by mouth daily. What changed:  medication strength  how much to take   pantoprazole 40 MG tablet Commonly known as:  PROTONIX Take 1 tablet (40 mg total) by mouth 2 (two) times daily.         Diet and Activity recommendation: See Discharge Instructions above   Consults obtained - Gastroenterology   Major procedures and Radiology Reports - PLEASE review detailed and final reports for all details, in brief -      Dg Chest 2 View  Result Date: 01/19/2016 CLINICAL DATA:  Cough EXAM: CHEST  2 VIEW COMPARISON:  05/04/2015 FINDINGS: Borderline cardiomegaly. Status post CABG. Central mild vascular congestion without convincing pulmonary edema. No segmental infiltrate.  IMPRESSION: Borderline cardiomegaly. Central vascular congestion without convincing pulmonary edema. No segmental infiltrate. Electronically Signed   By: Lahoma Crocker M.D.   On: 01/19/2016 15:38   US Paracentesis  Result Date: 01/20/2016 INDICATION: Ascites EXAM: ULTRASOUND GUIDED  PARACENTESIS MEDICATIONS: None. COMPLICATIONS: None immediate. PROCEDURE: Informed written consent was obtained from the patient after a discussion of the risks, benefits and alternatives to treatment. A timeout was performed prior to the initiation of the procedure. Initial ultrasound scanning demonstrates a large amount of ascites within the right mid abdomen . The right abdomen was prepped and draped in the usual sterile fashion. 1% lidocaine was used for local anesthesia. Following this, a 6 Fr Safe-T-Centesis catheter was introduced. An ultrasound image was saved for documentation purposes. The paracentesis was performed. The catheter was removed and a dressing was applied. The patient tolerated the procedure well without immediate post procedural complication. FINDINGS: A total of approximately 3.8 L of clear yellow fluid was removed. Samples were sent to the laboratory as requested by the clinical team. IMPRESSION: Successful ultrasound-guided paracentesis yielding 3.8 liters of peritoneal fluid. Electronically Signed   By: Inez Catalina M.D.   On: 01/20/2016 15:14    Micro Results  Recent Results (from the past 240 hour(s))  Culture, body fluid-bottle     Status: None (Preliminary result)   Collection Time: 01/20/16  1:01 PM  Result Value Ref Range Status   Specimen Description FLUID ABDOMEN  Final   Special Requests BOTTLES DRAWN AEROBIC AND ANAEROBIC 10CC  Final   Culture   Final    NO GROWTH 4 DAYS Performed at [facility]    Report Status PENDING  Incomplete  Gram stain     Status: None   Collection Time: 01/20/16  1:01 PM  Result Value Ref Range Status   Specimen Description FLUID ABDOMEN  Final    Special Requests NONE  Final   Gram Stain   Final    WBC PRESENT, PREDOMINANTLY MONONUCLEAR NO WBC SEEN CYTOSPIN Performed at [facility]    Report Status 01/20/2016 FINAL  Final       Today   Subjective:   Harvie Morua today has no headache,no chest abdominal pain,no new weakness tingling or numbness, feels much better wants to go home today.   Objective:   Blood pressure 117/76, pulse 63, temperature 98.5 F (36.9 C), temperature source Oral, resp. rate 18, height 6\' 3"  (1.905 m), weight 78.3 kg (172 lb 9.9 oz), SpO2 98 %.  No intake or output data in the 24 hours ending 01/24/16 1642  Exam Awake Alert, Oriented x 3, No new F.N deficits, Normal affect [place].AT,PERRAL Supple Neck,No JVD, No cervical lymphadenopathy appriciated.  Symmetrical Chest wall movement, Good air movement bilaterally, CTAB RRR,No Gallops,Rubs or new Murmurs, No Parasternal Heave +ve B.Sounds, Abd Soft, Non tender, No organomegaly appriciated, No rebound -guarding or rigidity. No Cyanosis, Clubbing or edema, No new Rash or bruise  Data Review   CBC w Diff:  Lab Results  Component Value Date   WBC 6.5 01/21/2016   HGB 13.5 01/21/2016   HGB 12.7 (L) 06/10/2014   HCT 41.8 01/21/2016   HCT 38.8 (L) 06/10/2014   PLT 180 01/21/2016   PLT 201 06/10/2014   LYMPHOPCT 7 01/19/2016   LYMPHOPCT 13.6 06/10/2014   BANDSPCT 1 04/05/2015   MONOPCT 8 01/19/2016   MONOPCT 10.4 06/10/2014   EOSPCT 2 01/19/2016   EOSPCT 0.7 06/10/2014   BASOPCT 1 01/19/2016   BASOPCT 0.6 06/10/2014    CMP:  Lab Results  Component Value Date   NA 135 01/21/2016   NA 143 06/10/2014   K 5.3 (H) 01/21/2016   K 5.1 06/10/2014   CL 102 01/21/2016   CL 102 06/10/2014   CO2 21 (L) 01/21/2016   CO2 30 06/10/2014   BUN 58 (H) 01/21/2016   BUN 20 06/10/2014   CREATININE 10.76 (H) 01/21/2016   CREATININE 5.42 (H) 06/10/2014   PROT 7.3 01/19/2016   PROT 7.3 06/10/2014   ALBUMIN 3.1 (L) 01/21/2016   ALBUMIN  3.4 (L) 06/10/2014   BILITOT 1.4 (H) 01/19/2016   BILITOT 1.9 (H) 06/10/2014   ALKPHOS 184 (H) 01/19/2016   ALKPHOS 156 (H) 06/10/2014   AST 30 01/19/2016   AST 53 (H) 06/10/2014   ALT 32 01/19/2016   ALT 32 06/10/2014  .   Total Time in preparing paper work, data evaluation and todays exam - 35 minutes  Aliese Brannum M.D on 01/22/2016 at 4:42 PM    Note: This dictation was prepared with Dragon dictation along with smaller phrase technology. Any transcriptional errors that result from this process are unintentional.

## 2016-01-25 LAB — PATHOLOGY

## 2016-01-25 LAB — CULTURE, BODY FLUID W GRAM STAIN -BOTTLE: Culture: NO GROWTH

## 2016-01-25 LAB — CULTURE, BODY FLUID-BOTTLE

## 2016-01-28 ENCOUNTER — Emergency Department
Admission: EM | Admit: 2016-01-28 | Discharge: 2016-01-28 | Disposition: A | Discharge status: Home / Self Care | Payer: Medicare Other | Attending: Student in an Organized Health Care Education/Training Program | Admitting: Student in an Organized Health Care Education/Training Program

## 2016-01-28 ENCOUNTER — Emergency Department: Payer: Medicare Other

## 2016-01-28 DIAGNOSIS — N2581 Secondary hyperparathyroidism of renal origin: Secondary | ICD-10-CM | POA: Diagnosis not present

## 2016-01-28 DIAGNOSIS — F17210 Nicotine dependence, cigarettes, uncomplicated: Secondary | ICD-10-CM | POA: Insufficient documentation

## 2016-01-28 DIAGNOSIS — Z7982 Long term (current) use of aspirin: Secondary | ICD-10-CM | POA: Diagnosis not present

## 2016-01-28 DIAGNOSIS — Z79899 Other long term (current) drug therapy: Secondary | ICD-10-CM | POA: Insufficient documentation

## 2016-01-28 DIAGNOSIS — K7031 Alcoholic cirrhosis of liver with ascites: Secondary | ICD-10-CM | POA: Insufficient documentation

## 2016-01-28 DIAGNOSIS — I509 Heart failure, unspecified: Secondary | ICD-10-CM | POA: Diagnosis not present

## 2016-01-28 DIAGNOSIS — I2510 Atherosclerotic heart disease of native coronary artery without angina pectoris: Secondary | ICD-10-CM | POA: Diagnosis not present

## 2016-01-28 DIAGNOSIS — E1122 Type 2 diabetes mellitus with diabetic chronic kidney disease: Secondary | ICD-10-CM | POA: Insufficient documentation

## 2016-01-28 DIAGNOSIS — Z955 Presence of coronary angioplasty implant and graft: Secondary | ICD-10-CM | POA: Insufficient documentation

## 2016-01-28 DIAGNOSIS — N186 End stage renal disease: Secondary | ICD-10-CM | POA: Insufficient documentation

## 2016-01-28 DIAGNOSIS — R0602 Shortness of breath: Secondary | ICD-10-CM | POA: Diagnosis not present

## 2016-01-28 DIAGNOSIS — Z992 Dependence on renal dialysis: Secondary | ICD-10-CM | POA: Diagnosis not present

## 2016-01-28 DIAGNOSIS — I110 Hypertensive heart disease with heart failure: Secondary | ICD-10-CM | POA: Diagnosis not present

## 2016-01-28 DIAGNOSIS — I132 Hypertensive heart and chronic kidney disease with heart failure and with stage 5 chronic kidney disease, or end stage renal disease: Secondary | ICD-10-CM | POA: Insufficient documentation

## 2016-01-28 DIAGNOSIS — G8929 Other chronic pain: Secondary | ICD-10-CM | POA: Diagnosis not present

## 2016-01-28 LAB — CBC WITH DIFFERENTIAL/PLATELET
BASOS PCT: 1 %
Basophils Absolute: 0 10*3/uL (ref 0–0.1)
EOS ABS: 0.1 10*3/uL (ref 0–0.7)
Eosinophils Relative: 2 %
HCT: 36.1 % — ABNORMAL LOW (ref 40.0–52.0)
HEMOGLOBIN: 11.8 g/dL — AB (ref 13.0–18.0)
Lymphocytes Relative: 10 %
Lymphs Abs: 0.6 10*3/uL — ABNORMAL LOW (ref 1.0–3.6)
MCH: 30.1 pg (ref 26.0–34.0)
MCHC: 32.6 g/dL (ref 32.0–36.0)
MCV: 92.2 fL (ref 80.0–100.0)
MONOS PCT: 14 %
Monocytes Absolute: 0.8 10*3/uL (ref 0.2–1.0)
NEUTROS PCT: 73 %
Neutro Abs: 4.5 10*3/uL (ref 1.4–6.5)
Platelets: 165 10*3/uL (ref 150–440)
RBC: 3.92 MIL/uL — ABNORMAL LOW (ref 4.40–5.90)
RDW: 16.7 % — ABNORMAL HIGH (ref 11.5–14.5)
WBC: 6 10*3/uL (ref 3.8–10.6)

## 2016-01-28 LAB — COMPREHENSIVE METABOLIC PANEL
ALBUMIN: 3.3 g/dL — AB (ref 3.5–5.0)
ALK PHOS: 194 U/L — AB (ref 38–126)
ALT: 37 U/L (ref 17–63)
ANION GAP: 9 (ref 5–15)
AST: 33 U/L (ref 15–41)
BUN: 32 mg/dL — ABNORMAL HIGH (ref 6–20)
CALCIUM: 8.2 mg/dL — AB (ref 8.9–10.3)
CHLORIDE: 102 mmol/L (ref 101–111)
CO2: 27 mmol/L (ref 22–32)
Creatinine, Ser: 6.13 mg/dL — ABNORMAL HIGH (ref 0.61–1.24)
GFR calc Af Amer: 11 mL/min — ABNORMAL LOW (ref >60)
GFR calc non Af Amer: 9 mL/min — ABNORMAL LOW (ref >60)
GLUCOSE: 120 mg/dL — AB (ref 65–99)
POTASSIUM: 4 mmol/L (ref 3.5–5.1)
SODIUM: 138 mmol/L (ref 135–145)
Total Bilirubin: 0.8 mg/dL (ref 0.3–1.2)
Total Protein: 6.8 g/dL (ref 6.5–8.1)

## 2016-01-28 LAB — PROTIME-INR
INR: 1.16
Prothrombin Time: 14.9 seconds (ref 11.4–15.2)

## 2016-01-28 NOTE — ED Provider Notes (Signed)
[facility] Emergency Department Provider Note    First MD Initiated Contact with Patient 01/28/16 1650     (approximate)  I have reviewed the triage vital signs and the nursing notes.   HISTORY  Chief Complaint Ascites and Shortness of Breath    HPI Stanley [name] is a 55 y.o. male with a history of alcoholic cirrhosis as well as end-stage renal disease on dialysis presents with worsening abdominal distention and shortness of breath. Patient has a history of recurrent ascites and had it last drink last week.  Denies any fevers. Denies any chest pain. States he gets short of breath on his abdomen gets becomes a stents. Denies any persistent alcohol abuse. No changes in medications. He did have dialysis today without any consultations.   Past Medical History:  Diagnosis Date  . Alcohol abuse   . Cirrhosis ([place])   . Coronary artery disease   . Diabetic peripheral neuropathy ([place])   . Drug abuse   . End stage renal disease on dialysis [facility]) NEPHROLOGIST-   DR [facility]  IN [place]   HEMODIALYSIS --   TUES/  THURS/  SAT  . GERD (gastroesophageal reflux disease)   . Hypertension   . Suicidal ideation    & HOMICIDAL IDEATION --  06-16-2013   ADMITTED TO BEHAVIOR HEALTH   Family History  Problem Relation Age of Onset  . Colon cancer Mother   . Cancer Father   . Cancer Sister    Past Surgical History:  Procedure Laterality Date  . CORONARY ANGIOPLASTY  ?   PT UNABLE TO TELL IF  BEFORE OR AFTER  CABG  . CORONARY ARTERY BYPASS GRAFT  2008  ([place] , [place])   3 VESSEL  . DIALYSIS FISTULA CREATION  LAST SURGERY  APPOX  2008  . ESOPHAGOGASTRODUODENOSCOPY N/A 05/07/2015   Procedure: ESOPHAGOGASTRODUODENOSCOPY (EGD);  Surgeon: Hulen Luster, MD;  Location: [facility] ENDOSCOPY;  Service: Endoscopy;  Laterality: N/A;  . ESOPHAGOGASTRODUODENOSCOPY (EGD) WITH PROPOFOL N/A 05/17/2015   Procedure: ESOPHAGOGASTRODUODENOSCOPY (EGD) WITH PROPOFOL;  Surgeon: [name], MD;   Location: [facility] ENDOSCOPY;  Service: Endoscopy;  Laterality: N/A;  . ESOPHAGOGASTRODUODENOSCOPY (EGD) WITH PROPOFOL N/A 01/20/2016   Procedure: ESOPHAGOGASTRODUODENOSCOPY (EGD) WITH PROPOFOL;  Surgeon: [name], MD;  Location: [facility] ENDOSCOPY;  Service: Endoscopy;  Laterality: N/A;  . MANDIBULAR HARDWARE REMOVAL N/A 07/29/2013   Procedure: REMOVAL OF ARCH BARS;  Surgeon: Theodoro Kos, DO;  Location: [place];  Service: Plastics;  Laterality: N/A;  . ORIF MANDIBULAR FRACTURE N/A 06/05/2013   Procedure: REPAIR OF MANDIBULAR FRACTURE x 2 with maxillo-mandibular fixation ;  Surgeon: Theodoro Kos, DO;  Location: [place];  Service: Plastics;  Laterality: N/A;   Patient Active Problem List   Diagnosis Date Noted  . Upper GI bleed 01/19/2016  . Blood in stool   . Angiodysplasia of stomach and duodenum with hemorrhage   . Gastritis   . Esophagitis, unspecified   . GI bleed 05/16/2015  . Acute GI bleeding   . Symptomatic anemia 04/30/2015  . HTN (hypertension) 04/06/2015  . GERD (gastroesophageal reflux disease) 04/06/2015  . HLD (hyperlipidemia) 04/06/2015  . Dyspnea 04/06/2015  . Cirrhosis ([place]) 04/06/2015  . Ascites 04/06/2015  . GIB (gastrointestinal bleeding) 03/23/2015  . Homicidal ideation 06/19/2013  . Suicidal intent 06/19/2013  . Homicidal ideations 06/19/2013  . Hyperkalemia 06/16/2013  . Mandible fracture ([place]) 06/05/2013  . Fracture, mandible ([place]) 06/02/2013  . Coronary atherosclerosis of native coronary artery 06/02/2013  .  ESRD on dialysis ([place]) 06/02/2013  . Mandible open fracture ([place]) 06/02/2013      Prior to Admission medications   Medication Sig Start Date End Date Taking? Authorizing Provider  amLODipine (NORVASC) 5 MG tablet Take 5 mg by mouth daily.    Historical Provider, MD  aspirin EC 81 MG tablet Take 81 mg by mouth at bedtime.     Historical Provider, MD  b complex-vitamin c-folic acid (NEPHRO-VITE) 0.8 MG TABS tablet Take 1 tablet by mouth  daily.    Historical Provider, MD  calcium acetate (PHOSLO) 667 MG capsule Take 2,001 mg by mouth 3 (three) times daily with meals.    Historical Provider, MD  cinacalcet (SENSIPAR) 30 MG tablet Take 30 mg by mouth daily with supper.    Historical Provider, MD  gabapentin (NEURONTIN) 300 MG capsule Take 1 capsule (300 mg total) by mouth daily. Patient taking differently: Take 300 mg by mouth at bedtime.  03/27/15   Bettey Costa, MD  labetalol (NORMODYNE) 100 MG tablet Take 1 tablet (100 mg total) by mouth daily. 01/22/16   Epifanio Lesches, MD  pantoprazole (PROTONIX) 40 MG tablet Take 1 tablet (40 mg total) by mouth 2 (two) times daily. 01/22/16   Epifanio Lesches, MD    Allergies Patient has no known allergies.    Social History Social History  Substance Use Topics  . Smoking status: Light Tobacco Smoker    Packs/day: 0.00    Years: 40.00    Types: Cigarettes  . Smokeless tobacco: Never Used  . Alcohol use Yes     Comment: 1 pint/occasionally    Review of Systems Patient denies headaches, rhinorrhea, blurry vision, numbness, shortness of breath, chest pain, edema, cough, abdominal pain, nausea, vomiting, diarrhea, dysuria, fevers, rashes or hallucinations unless otherwise stated above in HPI. ____________________________________________   PHYSICAL EXAM:  VITAL SIGNS: Vitals:   01/28/16 1649  BP: (!) 146/77  Resp: 16  Temp: 98.3 F (36.8 C)    Constitutional: Alert and oriented. Chronically ill appearing in no acute distress. Eyes: Conjunctivae are normal. PERRL. EOMI. Head: Atraumatic. Nose: No congestion/rhinnorhea. Mouth/Throat: Mucous membranes are moist.  Oropharynx non-erythematous. Neck: No stridor. Painless ROM. No cervical spine tenderness to palpation Hematological/Lymphatic/Immunilogical: No cervical lymphadenopathy. Cardiovascular: Normal rate, regular rhythm. Grossly normal heart sounds.  Good peripheral circulation. Respiratory: Normal respiratory  effort.  No retractions. Coarse breath sounds bilaterally Gastrointestinal: distended abdomen.  No fluid wave.  No peritonitis. No abdominal bruits. No CVA tenderness.  Musculoskeletal: No lower extremity tenderness nor edema.  No joint effusions. Neurologic:  Normal speech and language. No gross focal neurologic deficits are appreciated. No gait instability. Skin:  Skin is warm, dry and intact. No rash noted. Psychiatric: Mood and affect are normal. Speech and behavior are normal.  ____________________________________________   LABS (all labs ordered are listed, but only abnormal results are displayed)  Results for orders placed or performed during the hospital encounter of 01/28/16 (from the past 24 hour(s))  CBC with Differential/Platelet     Status: Abnormal   Collection Time: 01/28/16  4:56 PM  Result Value Ref Range   WBC 6.0 3.8 - 10.6 K/uL   RBC 3.92 (L) 4.40 - 5.90 MIL/uL   Hemoglobin 11.8 (L) 13.0 - 18.0 g/dL   HCT 36.1 (L) 40.0 - 52.0 %   MCV 92.2 80.0 - 100.0 fL   MCH 30.1 26.0 - 34.0 pg   MCHC 32.6 32.0 - 36.0 g/dL   RDW 16.7 (H) 11.5 - 14.5 %  Platelets 165 150 - 440 K/uL   Neutrophils Relative % 73 %   Neutro Abs 4.5 1.4 - 6.5 K/uL   Lymphocytes Relative 10 %   Lymphs Abs 0.6 (L) 1.0 - 3.6 K/uL   Monocytes Relative 14 %   Monocytes Absolute 0.8 0.2 - 1.0 K/uL   Eosinophils Relative 2 %   Eosinophils Absolute 0.1 0 - 0.7 K/uL   Basophils Relative 1 %   Basophils Absolute 0.0 0 - 0.1 K/uL  Comprehensive metabolic panel     Status: Abnormal   Collection Time: 01/28/16  4:56 PM  Result Value Ref Range   Sodium 138 135 - 145 mmol/L   Potassium 4.0 3.5 - 5.1 mmol/L   Chloride 102 101 - 111 mmol/L   CO2 27 22 - 32 mmol/L   Glucose, Bld 120 (H) 65 - 99 mg/dL   BUN 32 (H) 6 - 20 mg/dL   Creatinine, Ser 6.13 (H) 0.61 - 1.24 mg/dL   Calcium 8.2 (L) 8.9 - 10.3 mg/dL   Total Protein 6.8 6.5 - 8.1 g/dL   Albumin 3.3 (L) 3.5 - 5.0 g/dL   AST 33 15 - 41 U/L   ALT 37  17 - 63 U/L   Alkaline Phosphatase 194 (H) 38 - 126 U/L   Total Bilirubin 0.8 0.3 - 1.2 mg/dL   GFR calc non Af Amer 9 (L) >60 mL/min   GFR calc Af Amer 11 (L) >60 mL/min   Anion gap 9 5 - 15  Protime-INR     Status: None   Collection Time: 01/28/16  4:56 PM  Result Value Ref Range   Prothrombin Time 14.9 11.4 - 15.2 seconds   INR 1.16    ____________________________________________  EKG My review and personal interpretation at Time: 17:42   Indication: sob  Rate: 75  Rhythm: sinus Axis: normal Other: non specific st changes, no acute ischemic changes ____________________________________________  RADIOLOGY  I personally reviewed all radiographic images ordered to evaluate for the above acute complaints and reviewed radiology reports and findings.  These findings were personally discussed with the patient.  Please see medical record for radiology report.  ____________________________________________   PROCEDURES  Procedure(s) performed: none Procedures    Critical Care performed:  ____________________________________________   INITIAL IMPRESSION / ASSESSMENT AND PLAN / ED COURSE  Pertinent labs & imaging results that were available during my care of the patient were reviewed by me and considered in my medical decision making (see chart for details).  DDX: recurrent ascites, liver failure, hepatorenal syndrome, pna, chf   Stanley [name] is a 55 y.o. who presents to the ED with Complaint of distended abdomen and worsening shortness of breath. He arrives from dialysis in no acute distress. His abdomen is distended but not tender to palpation. No fluid wave. Bedside ultrasound shows a few small pockets of fluid but certainly not enough fluid to explain his distended abdomen. The bowel wall does appear edematous. Suspect component of hypoalbuminemia as well as bowel wall edema.  The patient will be placed on continuous pulse oximetry and telemetry for monitoring.   Laboratory evaluation will be sent to evaluate for the above complaints.     Clinical Course as of Jan 28 2112  Thu Jan 28, 2016  1843 Patient was able to ambulate [name] the hall without any hypoxia or shortness of breath. His EKG shows evidence of acute ischemic changes. His blood pressure is otherwise unremarkable chest x-ray shows chronic congestive heart failure without significant  edema. Patient does likely have form of recurrent ascites but does not appear to be causing him any respiratory distress at this time. Do not feel emergent paracentesis chronically indicated. He has had multiple recurrent paracentesis performed in the past. His exam and presentation is not likely consistent with SBP as he has no fever, leukocytosis or abdominal pain. I discussed need for follow-up with PCP as well as GI given his underlying cirrhosis. Patient encouraged to increase ingestion of protein as his hypoalbuminemia is likely component of his recurrent ascites.  Patient was able to tolerate PO and was able to ambulate with a steady gait.  Have discussed with the patient and available family all diagnostics and treatments performed thus far and all questions were answered to the best of my ability. The patient demonstrates understanding and agreement with plan.   [PR]    Clinical Course User Index [PR] Merlyn Lot, MD     ____________________________________________   FINAL CLINICAL IMPRESSION(S) / ED DIAGNOSES  Final diagnoses:  Alcoholic cirrhosis of liver with ascites ([place])  ESRD (end stage renal disease) on dialysis ([place])  Chronic congestive heart failure, unspecified congestive heart failure type ([place])      NEW MEDICATIONS STARTED DURING THIS VISIT:  New Prescriptions   No medications on file     Note:  This document was prepared using Dragon voice recognition software and may include unintentional dictation errors.    Merlyn Lot, MD 01/28/16 2113

## 2016-01-28 NOTE — ED Notes (Signed)
Pt tolerated ambulation in hallway, O2 sat 97-100%

## 2016-01-28 NOTE — ED Triage Notes (Signed)
Pt from dialysis, received complete tx. Sent over for chronic SOB and ascites

## 2016-01-30 DIAGNOSIS — Z992 Dependence on renal dialysis: Secondary | ICD-10-CM | POA: Diagnosis not present

## 2016-01-30 DIAGNOSIS — N186 End stage renal disease: Secondary | ICD-10-CM | POA: Diagnosis not present

## 2016-01-30 DIAGNOSIS — N2581 Secondary hyperparathyroidism of renal origin: Secondary | ICD-10-CM | POA: Diagnosis not present

## 2016-02-01 ENCOUNTER — Emergency Department: Payer: Medicare Other

## 2016-02-01 ENCOUNTER — Emergency Department
Admission: EM | Admit: 2016-02-01 | Discharge: 2016-02-01 | Disposition: A | Discharge status: Home / Self Care | Payer: Medicare Other | Attending: Emergency Medicine | Admitting: Emergency Medicine

## 2016-02-01 ENCOUNTER — Encounter: Payer: Self-pay | Admitting: Intensive Care

## 2016-02-01 DIAGNOSIS — E1122 Type 2 diabetes mellitus with diabetic chronic kidney disease: Secondary | ICD-10-CM | POA: Insufficient documentation

## 2016-02-01 DIAGNOSIS — Z79899 Other long term (current) drug therapy: Secondary | ICD-10-CM | POA: Insufficient documentation

## 2016-02-01 DIAGNOSIS — Z7982 Long term (current) use of aspirin: Secondary | ICD-10-CM | POA: Insufficient documentation

## 2016-02-01 DIAGNOSIS — R140 Abdominal distension (gaseous): Secondary | ICD-10-CM | POA: Insufficient documentation

## 2016-02-01 DIAGNOSIS — F17210 Nicotine dependence, cigarettes, uncomplicated: Secondary | ICD-10-CM | POA: Diagnosis not present

## 2016-02-01 DIAGNOSIS — Z992 Dependence on renal dialysis: Secondary | ICD-10-CM | POA: Diagnosis not present

## 2016-02-01 DIAGNOSIS — I120 Hypertensive chronic kidney disease with stage 5 chronic kidney disease or end stage renal disease: Secondary | ICD-10-CM | POA: Diagnosis not present

## 2016-02-01 DIAGNOSIS — N186 End stage renal disease: Secondary | ICD-10-CM | POA: Diagnosis not present

## 2016-02-01 DIAGNOSIS — I2510 Atherosclerotic heart disease of native coronary artery without angina pectoris: Secondary | ICD-10-CM | POA: Diagnosis not present

## 2016-02-01 DIAGNOSIS — R0602 Shortness of breath: Secondary | ICD-10-CM | POA: Diagnosis not present

## 2016-02-01 DIAGNOSIS — N2581 Secondary hyperparathyroidism of renal origin: Secondary | ICD-10-CM | POA: Diagnosis not present

## 2016-02-01 LAB — BASIC METABOLIC PANEL
Anion gap: 9 (ref 5–15)
BUN: 28 mg/dL — AB (ref 6–20)
CHLORIDE: 100 mmol/L — AB (ref 101–111)
CO2: 27 mmol/L (ref 22–32)
Calcium: 8.6 mg/dL — ABNORMAL LOW (ref 8.9–10.3)
Creatinine, Ser: 4.44 mg/dL — ABNORMAL HIGH (ref 0.61–1.24)
GFR calc Af Amer: 16 mL/min — ABNORMAL LOW (ref >60)
GFR calc non Af Amer: 14 mL/min — ABNORMAL LOW (ref >60)
GLUCOSE: 132 mg/dL — AB (ref 65–99)
POTASSIUM: 3.7 mmol/L (ref 3.5–5.1)
Sodium: 136 mmol/L (ref 135–145)

## 2016-02-01 LAB — CBC WITH DIFFERENTIAL/PLATELET
BASOS ABS: 0.1 10*3/uL (ref 0–0.1)
Basophils Relative: 1 %
Eosinophils Absolute: 0.2 10*3/uL (ref 0–0.7)
Eosinophils Relative: 3 %
HEMATOCRIT: 35 % — AB (ref 40.0–52.0)
HEMOGLOBIN: 11.6 g/dL — AB (ref 13.0–18.0)
LYMPHS PCT: 12 %
Lymphs Abs: 0.7 10*3/uL — ABNORMAL LOW (ref 1.0–3.6)
MCH: 30.3 pg (ref 26.0–34.0)
MCHC: 33 g/dL (ref 32.0–36.0)
MCV: 91.8 fL (ref 80.0–100.0)
MONO ABS: 0.7 10*3/uL (ref 0.2–1.0)
Monocytes Relative: 11 %
NEUTROS ABS: 4.4 10*3/uL (ref 1.4–6.5)
Neutrophils Relative %: 73 %
Platelets: 179 10*3/uL (ref 150–440)
RBC: 3.81 MIL/uL — ABNORMAL LOW (ref 4.40–5.90)
RDW: 16.6 % — AB (ref 11.5–14.5)
WBC: 6 10*3/uL (ref 3.8–10.6)

## 2016-02-01 MED ORDER — DIPHENHYDRAMINE HCL 25 MG PO CAPS
ORAL_CAPSULE | ORAL | Status: AC
  Administered 2016-02-01: 25 mg via ORAL
  Filled 2016-02-01: qty 1

## 2016-02-01 MED ORDER — DIPHENHYDRAMINE HCL 25 MG PO CAPS
25.0000 mg | ORAL_CAPSULE | Freq: Once | ORAL | Status: AC
  Administered 2016-02-01: 25 mg via ORAL

## 2016-02-01 NOTE — ED Notes (Signed)
Pt left before discharge paperwork could be reviewed or given to him.

## 2016-02-01 NOTE — ED Triage Notes (Addendum)
Pt arrived by EMS from home for c/o SOB due to overload of fluid in his stomach. C/o pain in abdomen. Pt has dialysis on Saturday, Tuesday, and Thursday. Patient had dialysis today due to holidays coming up. Patient reports he is still 4kilos over dry weight. Patient states "I told dialysis while I was there that I was having breathing difficulties and they blew it off" HX triple bypass surgery. EMS vitals RA100, heartrate 72, b/p 149/79. Pt alert and oriented. Access on R arm

## 2016-02-01 NOTE — ED Notes (Signed)
Pt seen leaving room, ambulatory w/o issue. Resp even and unlabored, skin WNL Pts RN informed.

## 2016-02-01 NOTE — ED Provider Notes (Signed)
[facility] Emergency Department Provider Note  ____________________________________________  Time seen: Approximately 8:07 PM  I have reviewed the triage vital signs and the nursing notes.   HISTORY  Chief Complaint Shortness of Breath    HPI Stanley [name] is a 55 y.o. male with a history of end-stage renal disease on dialysis, normally on a Tuesday Thursday Saturday schedule. Because of the Thanksgiving holiday coming up this Thursday is weak he is getting dialysis Monday Wednesday and Saturday. Today he got dialysis a early. She reports that afterward he was 4 kg over his dry weight and that they did not remove enough fluid. He is concerned that he has bloating in his abdomen. He also feels mildly short of breath but is not exertional, not orthopneic. He reports that usually when he is volume overloaded he gets peripheral edema which he does not have presently. No cough or fever.     Past Medical History:  Diagnosis Date  . Alcohol abuse   . Cirrhosis ([place])   . Coronary artery disease   . Diabetic peripheral neuropathy ([place])   . Drug abuse   . End stage renal disease on dialysis [facility]) NEPHROLOGIST-   DR [facility]  IN [place]   HEMODIALYSIS --   TUES/  THURS/  SAT  . GERD (gastroesophageal reflux disease)   . Hypertension   . Suicidal ideation    & HOMICIDAL IDEATION --  06-16-2013   ADMITTED TO BEHAVIOR HEALTH     Patient Active Problem List   Diagnosis Date Noted  . Upper GI bleed 01/19/2016  . Blood in stool   . Angiodysplasia of stomach and duodenum with hemorrhage   . Gastritis   . Esophagitis, unspecified   . GI bleed 05/16/2015  . Acute GI bleeding   . Symptomatic anemia 04/30/2015  . HTN (hypertension) 04/06/2015  . GERD (gastroesophageal reflux disease) 04/06/2015  . HLD (hyperlipidemia) 04/06/2015  . Dyspnea 04/06/2015  . Cirrhosis ([place]) 04/06/2015  . Ascites 04/06/2015  . GIB (gastrointestinal bleeding) 03/23/2015  .  Homicidal ideation 06/19/2013  . Suicidal intent 06/19/2013  . Homicidal ideations 06/19/2013  . Hyperkalemia 06/16/2013  . Mandible fracture ([place]) 06/05/2013  . Fracture, mandible ([place]) 06/02/2013  . Coronary atherosclerosis of native coronary artery 06/02/2013  . ESRD on dialysis ([place]) 06/02/2013  . Mandible open fracture ([place]) 06/02/2013     Past Surgical History:  Procedure Laterality Date  . CORONARY ANGIOPLASTY  ?   PT UNABLE TO TELL IF  BEFORE OR AFTER  CABG  . CORONARY ARTERY BYPASS GRAFT  2008  ([place] , [place])   3 VESSEL  . DIALYSIS FISTULA CREATION  LAST SURGERY  APPOX  2008  . ESOPHAGOGASTRODUODENOSCOPY N/A 05/07/2015   Procedure: ESOPHAGOGASTRODUODENOSCOPY (EGD);  Surgeon: Hulen Luster, MD;  Location: [facility] ENDOSCOPY;  Service: Endoscopy;  Laterality: N/A;  . ESOPHAGOGASTRODUODENOSCOPY (EGD) WITH PROPOFOL N/A 05/17/2015   Procedure: ESOPHAGOGASTRODUODENOSCOPY (EGD) WITH PROPOFOL;  Surgeon: [name], MD;  Location: [facility] ENDOSCOPY;  Service: Endoscopy;  Laterality: N/A;  . ESOPHAGOGASTRODUODENOSCOPY (EGD) WITH PROPOFOL N/A 01/20/2016   Procedure: ESOPHAGOGASTRODUODENOSCOPY (EGD) WITH PROPOFOL;  Surgeon: [name], MD;  Location: [facility] ENDOSCOPY;  Service: Endoscopy;  Laterality: N/A;  . MANDIBULAR HARDWARE REMOVAL N/A 07/29/2013   Procedure: REMOVAL OF ARCH BARS;  Surgeon: Theodoro Kos, DO;  Location: [place];  Service: Plastics;  Laterality: N/A;  . ORIF MANDIBULAR FRACTURE N/A 06/05/2013   Procedure: REPAIR OF MANDIBULAR FRACTURE x 2 with maxillo-mandibular fixation ;  Surgeon:  Claire Sanger, DO;  Location: [place];  Service: Clinical cytogeneticist;  Laterality: N/A;     Prior to Admission medications   Medication Sig Start Date End Date Taking? Authorizing Provider  amLODipine (NORVASC) 5 MG tablet Take 5 mg by mouth daily.    Historical Provider, MD  aspirin EC 81 MG tablet Take 81 mg by mouth at bedtime.     Historical Provider, MD  b complex-vitamin c-folic acid  (NEPHRO-VITE) 0.8 MG TABS tablet Take 1 tablet by mouth daily.    Historical Provider, MD  calcium acetate (PHOSLO) 667 MG capsule Take 2,001 mg by mouth 3 (three) times daily with meals.    Historical Provider, MD  cinacalcet (SENSIPAR) 30 MG tablet Take 30 mg by mouth daily with supper.    Historical Provider, MD  gabapentin (NEURONTIN) 300 MG capsule Take 1 capsule (300 mg total) by mouth daily. Patient taking differently: Take 300 mg by mouth at bedtime.  03/27/15   Bettey Costa, MD  labetalol (NORMODYNE) 100 MG tablet Take 1 tablet (100 mg total) by mouth daily. 01/22/16   Epifanio Lesches, MD  pantoprazole (PROTONIX) 40 MG tablet Take 1 tablet (40 mg total) by mouth 2 (two) times daily. 01/22/16   Epifanio Lesches, MD     Allergies Patient has no known allergies.   Family History  Problem Relation Age of Onset  . Colon cancer Mother   . Cancer Father   . Cancer Sister     Social History Social History  Substance Use Topics  . Smoking status: Current Every Day Smoker    Packs/day: 0.00    Years: 40.00    Types: Cigarettes  . Smokeless tobacco: Never Used  . Alcohol use No     Comment: pt reports quitting after learning about cirrhosis    Review of Systems  Constitutional:   No fever or chills.  ENT:   No sore throat. No rhinorrhea. Cardiovascular:   No chest pain. Respiratory:   Mild shortness of breath without cough. Gastrointestinal:   Negative for abdominal pain, vomiting and diarrhea. Reports abdominal distention Musculoskeletal:   Negative for focal pain or swelling 10-point ROS otherwise negative.  ____________________________________________   PHYSICAL EXAM:  VITAL SIGNS: ED Triage Vitals  Enc Vitals Group     BP 02/01/16 1657 (!) 144/86     Pulse Rate 02/01/16 1657 76     Resp 02/01/16 1657 18     Temp 02/01/16 1657 97.9 F (36.6 C)     Temp Source 02/01/16 1657 Oral     SpO2 02/01/16 1657 100 %     Weight 02/01/16 1659 191 lb (86.6 kg)      Height 02/01/16 1659 6\' 3"  (1.905 m)     Head Circumference --      Peak Flow --      Pain Score 02/01/16 1703 7     Pain Loc --      Pain Edu? --      Excl. in [place]? --     Vital signs reviewed, nursing assessments reviewed.   Constitutional:   Alert and oriented. Well appearing and in no distress. Eyes:   No scleral icterus. No conjunctival pallor. PERRL. EOMI.  No nystagmus. ENT   Head:   Normocephalic and atraumatic.   Nose:   No congestion/rhinnorhea. No septal hematoma   Mouth/Throat:   MMM, no pharyngeal erythema. No peritonsillar mass.    Neck:   No stridor. No SubQ emphysema. No meningismus. Hematological/Lymphatic/Immunilogical:   No  cervical lymphadenopathy. Cardiovascular:   RRR. Symmetric bilateral radial and DP pulses.  No murmurs.  Respiratory:   Normal respiratory effort without tachypnea nor retractions. Breath sounds are clear and equal bilaterally. No wheezes/rales/rhonchi. Gastrointestinal:   Soft and nontender. Mildly distended without ascites. There is no CVA tenderness.  No rebound, rigidity, or guarding. Genitourinary:   deferred Musculoskeletal:   Nontender with normal range of motion in all extremities. No joint effusions.  No lower extremity tenderness.  No edema. Neurologic:   Normal speech and language.  CN 2-10 normal. Motor grossly intact. No gross focal neurologic deficits are appreciated.  Skin:    Skin is warm, dry and intact. No rash noted.  No petechiae, purpura, or bullae.  ____________________________________________    LABS (pertinent positives/negatives) (all labs ordered are listed, but only abnormal results are displayed) Labs Reviewed  BASIC METABOLIC PANEL - Abnormal; Notable for the following:       Result Value   Chloride 100 (*)    Glucose, Bld 132 (*)    BUN 28 (*)    Creatinine, Ser 4.44 (*)    Calcium 8.6 (*)    GFR calc non Af Amer 14 (*)    GFR calc Af Amer 16 (*)    All other components within normal limits   CBC WITH DIFFERENTIAL/PLATELET - Abnormal; Notable for the following:    RBC 3.81 (*)    Hemoglobin 11.6 (*)    HCT 35.0 (*)    RDW 16.6 (*)    Lymphs Abs 0.7 (*)    All other components within normal limits   ____________________________________________   EKG  Interpreted by me  Date: 02/01/2016  Rate: 84  Rhythm: normal sinus rhythm  QRS Axis: normal  Intervals: normal  ST/T Wave abnormalities: normal  Conduction Disutrbances: none  Narrative Interpretation: unremarkable      ____________________________________________    RADIOLOGY  Chest x-ray unremarkable, imaging reviewed by me  ____________________________________________   PROCEDURES Procedures  ____________________________________________   INITIAL IMPRESSION / ASSESSMENT AND PLAN / ED COURSE  Pertinent labs & imaging results that were available during my care of the patient were reviewed by me and considered in my medical decision making (see chart for details).  Patient well appearing no acute distress. No JVD no orthopnea. Vital signs unremarkable. No hypoxia. Appears to be at baseline without any evidence of edema or volume overload. Labs all show any acidosis, alert joint problems, uremia. No history of ingestion. Patient's eating and drinking normally. Advised him that if he is concerned about being over his dry weight, to further restrict his fluids until his next dialysis session on Wednesday, but there is no indication for emergent dialysis or hospitalization at this time. He is medically stable will be discharged home. Low suspicion for ACS PE dissection pneumonia sepsis or pneumothorax. I highly doubt that he has any abdominal pathology such as AAA perforation obstruction biliary disease or appendicitis. No evidence of peritonitis at this time     Clinical Course    ____________________________________________   FINAL CLINICAL IMPRESSION(S) / ED DIAGNOSES  Final diagnoses:  Shortness  of breath  Abdominal distension       Portions of this note were generated with dragon dictation software. Dictation errors may occur despite best attempts at proofreading.    Carrie Mew, MD 02/01/16 2010

## 2016-02-03 DIAGNOSIS — Z992 Dependence on renal dialysis: Secondary | ICD-10-CM | POA: Diagnosis not present

## 2016-02-03 DIAGNOSIS — N186 End stage renal disease: Secondary | ICD-10-CM | POA: Diagnosis not present

## 2016-02-03 DIAGNOSIS — N2581 Secondary hyperparathyroidism of renal origin: Secondary | ICD-10-CM | POA: Diagnosis not present

## 2016-02-06 DIAGNOSIS — N2581 Secondary hyperparathyroidism of renal origin: Secondary | ICD-10-CM | POA: Diagnosis not present

## 2016-02-06 DIAGNOSIS — N186 End stage renal disease: Secondary | ICD-10-CM | POA: Diagnosis not present

## 2016-02-06 DIAGNOSIS — Z992 Dependence on renal dialysis: Secondary | ICD-10-CM | POA: Diagnosis not present

## 2016-02-09 DIAGNOSIS — N186 End stage renal disease: Secondary | ICD-10-CM | POA: Diagnosis not present

## 2016-02-09 DIAGNOSIS — Z992 Dependence on renal dialysis: Secondary | ICD-10-CM | POA: Diagnosis not present

## 2016-02-09 DIAGNOSIS — N2581 Secondary hyperparathyroidism of renal origin: Secondary | ICD-10-CM | POA: Diagnosis not present

## 2016-02-11 DIAGNOSIS — N186 End stage renal disease: Secondary | ICD-10-CM | POA: Diagnosis not present

## 2016-02-11 DIAGNOSIS — N2581 Secondary hyperparathyroidism of renal origin: Secondary | ICD-10-CM | POA: Diagnosis not present

## 2016-02-11 DIAGNOSIS — Z992 Dependence on renal dialysis: Secondary | ICD-10-CM | POA: Diagnosis not present

## 2016-02-13 DIAGNOSIS — N2581 Secondary hyperparathyroidism of renal origin: Secondary | ICD-10-CM | POA: Diagnosis not present

## 2016-02-13 DIAGNOSIS — D509 Iron deficiency anemia, unspecified: Secondary | ICD-10-CM | POA: Diagnosis not present

## 2016-02-13 DIAGNOSIS — Z992 Dependence on renal dialysis: Secondary | ICD-10-CM | POA: Diagnosis not present

## 2016-02-13 DIAGNOSIS — N186 End stage renal disease: Secondary | ICD-10-CM | POA: Diagnosis not present

## 2016-02-16 DIAGNOSIS — D509 Iron deficiency anemia, unspecified: Secondary | ICD-10-CM | POA: Diagnosis not present

## 2016-02-16 DIAGNOSIS — N2581 Secondary hyperparathyroidism of renal origin: Secondary | ICD-10-CM | POA: Diagnosis not present

## 2016-02-16 DIAGNOSIS — Z992 Dependence on renal dialysis: Secondary | ICD-10-CM | POA: Diagnosis not present

## 2016-02-16 DIAGNOSIS — N186 End stage renal disease: Secondary | ICD-10-CM | POA: Diagnosis not present

## 2016-02-18 DIAGNOSIS — D509 Iron deficiency anemia, unspecified: Secondary | ICD-10-CM | POA: Diagnosis not present

## 2016-02-18 DIAGNOSIS — N2581 Secondary hyperparathyroidism of renal origin: Secondary | ICD-10-CM | POA: Diagnosis not present

## 2016-02-18 DIAGNOSIS — N186 End stage renal disease: Secondary | ICD-10-CM | POA: Diagnosis not present

## 2016-02-18 DIAGNOSIS — Z992 Dependence on renal dialysis: Secondary | ICD-10-CM | POA: Diagnosis not present

## 2016-02-20 DIAGNOSIS — N186 End stage renal disease: Secondary | ICD-10-CM | POA: Diagnosis not present

## 2016-02-20 DIAGNOSIS — N2581 Secondary hyperparathyroidism of renal origin: Secondary | ICD-10-CM | POA: Diagnosis not present

## 2016-02-20 DIAGNOSIS — Z992 Dependence on renal dialysis: Secondary | ICD-10-CM | POA: Diagnosis not present

## 2016-02-20 DIAGNOSIS — D509 Iron deficiency anemia, unspecified: Secondary | ICD-10-CM | POA: Diagnosis not present

## 2016-02-21 IMAGING — CR DG CHEST 2V
1 series · 2 of 2 positions shown · non-contrast
Comparison: 06/20/2013

CLINICAL DATA: Wheezing and coughing for 2 weeks, shortness of
breath, question bleeding in abdomen, history MI, bypass surgery

EXAM:
CHEST  2 VIEW

[Series 1: dxr chest pa (or ap) and lateral · 0.14mm/px · 2 of 2 slices shown]
[im 1/2]
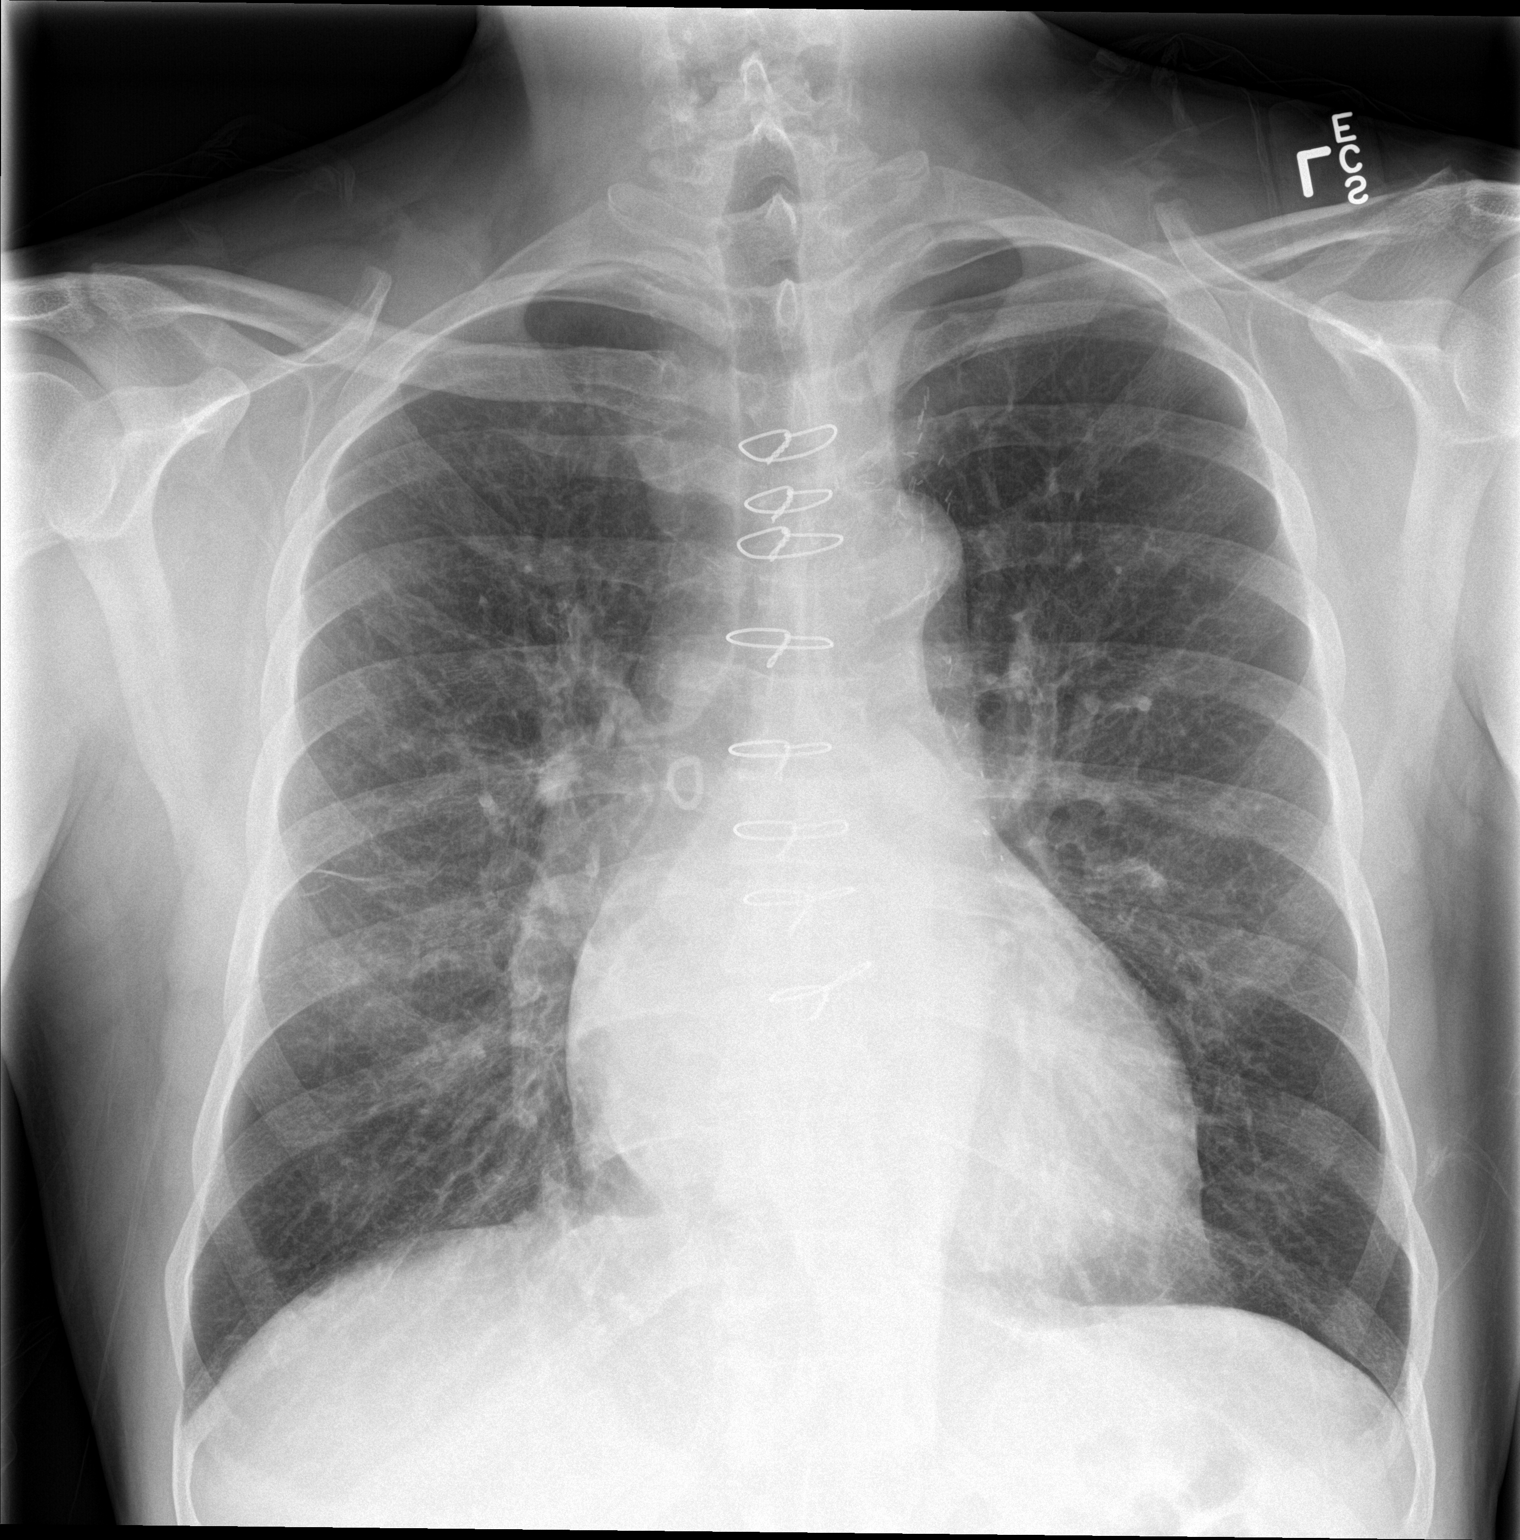
[im 2/2]
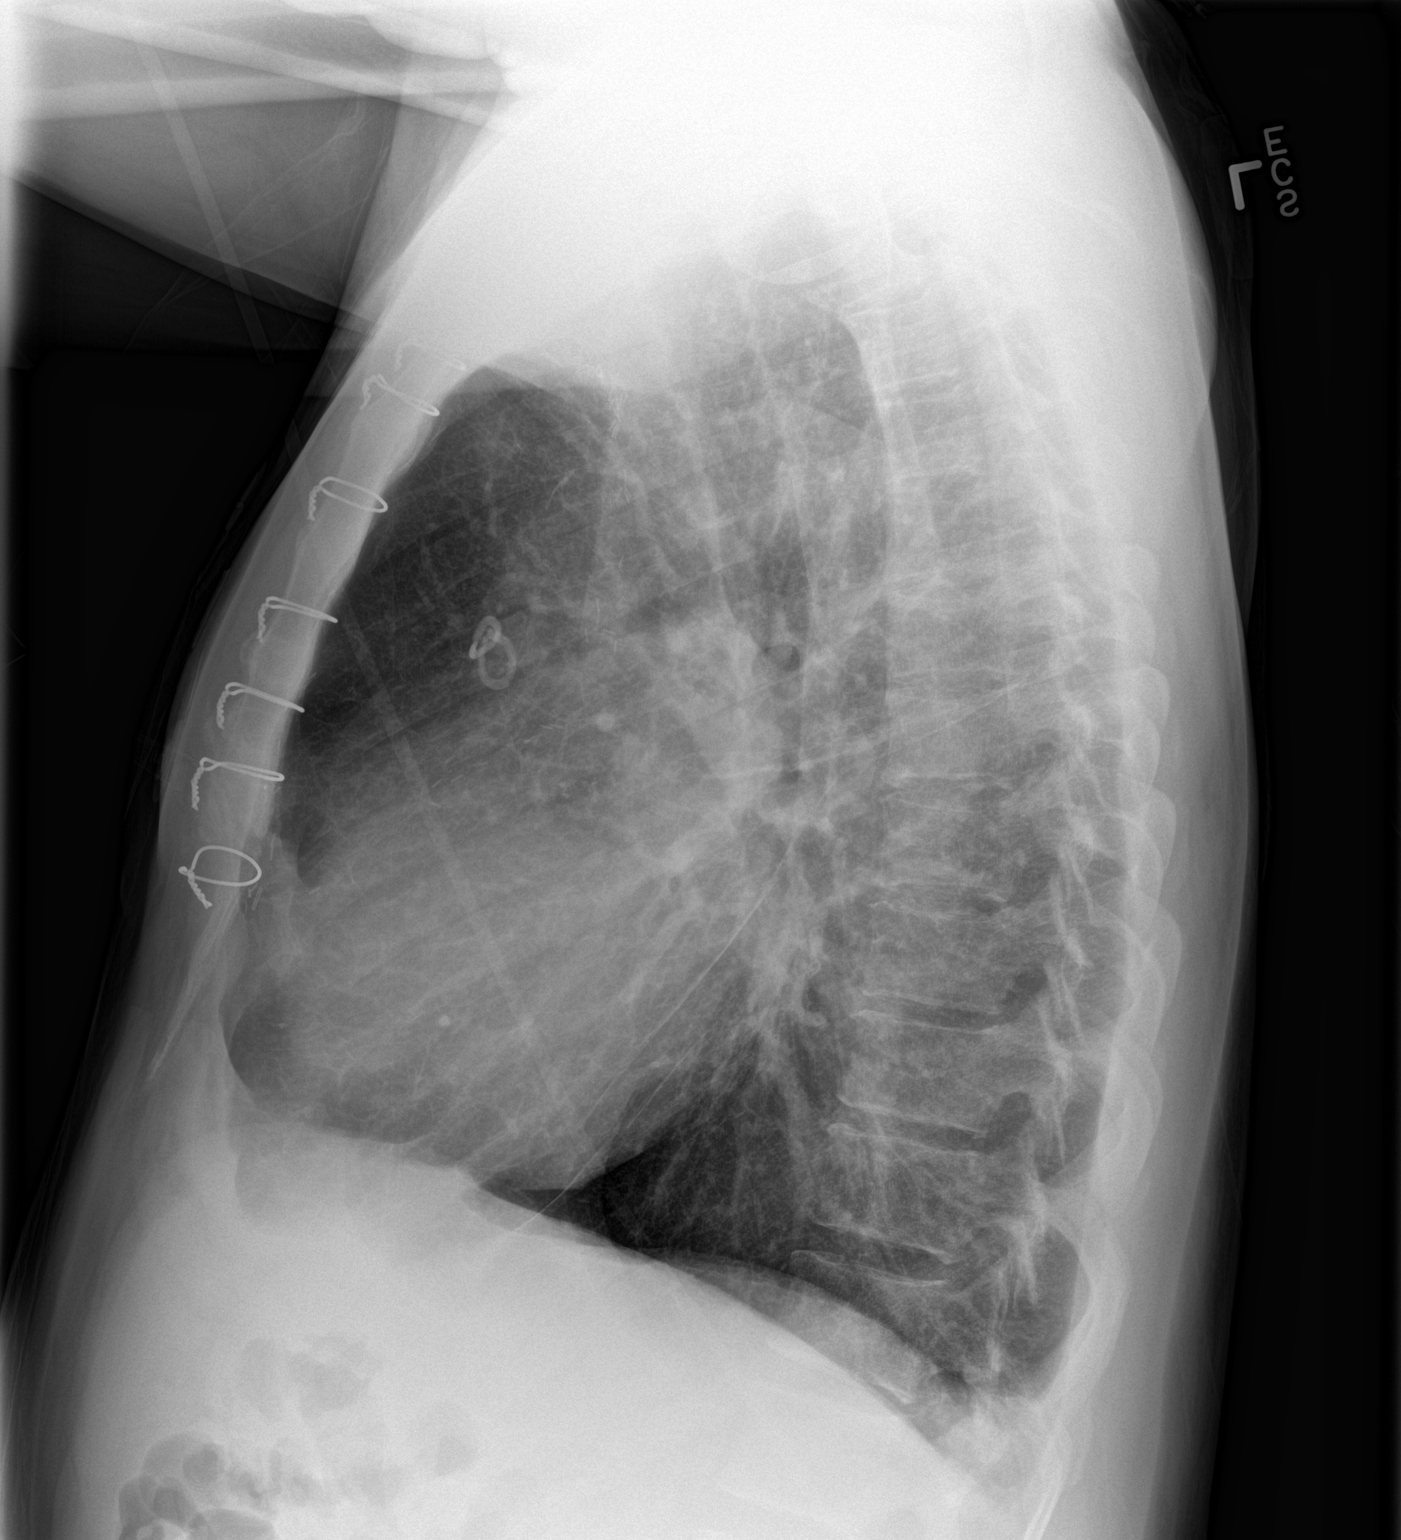

[2 of 2 positions shown; findings below may reference images not displayed]

FINDINGS: Minimal enlargement of cardiac silhouette post CABG.

Atherosclerotic calcification aorta.

Mediastinal contours and pulmonary vascularity normal.

Emphysematous and bronchitic changes consistent with COPD.

No acute infiltrate, pleural effusion or pneumothorax.

Bones unremarkable.
IMPRESSION: Minimal enlargement of cardiac silhouette post CABG.

COPD changes without acute infiltrate.

## 2016-02-23 DIAGNOSIS — N2581 Secondary hyperparathyroidism of renal origin: Secondary | ICD-10-CM | POA: Diagnosis not present

## 2016-02-23 DIAGNOSIS — Z992 Dependence on renal dialysis: Secondary | ICD-10-CM | POA: Diagnosis not present

## 2016-02-23 DIAGNOSIS — N186 End stage renal disease: Secondary | ICD-10-CM | POA: Diagnosis not present

## 2016-02-23 DIAGNOSIS — D509 Iron deficiency anemia, unspecified: Secondary | ICD-10-CM | POA: Diagnosis not present

## 2016-02-25 ENCOUNTER — Encounter: Payer: Self-pay | Admitting: Emergency Medicine

## 2016-02-25 ENCOUNTER — Emergency Department
Admission: EM | Admit: 2016-02-25 | Discharge: 2016-02-25 | Disposition: A | Discharge status: Home / Self Care | Payer: Medicare Other | Attending: Emergency Medicine | Admitting: Emergency Medicine

## 2016-02-25 DIAGNOSIS — E1122 Type 2 diabetes mellitus with diabetic chronic kidney disease: Secondary | ICD-10-CM | POA: Insufficient documentation

## 2016-02-25 DIAGNOSIS — Z992 Dependence on renal dialysis: Secondary | ICD-10-CM | POA: Diagnosis not present

## 2016-02-25 DIAGNOSIS — I120 Hypertensive chronic kidney disease with stage 5 chronic kidney disease or end stage renal disease: Secondary | ICD-10-CM | POA: Insufficient documentation

## 2016-02-25 DIAGNOSIS — Z7982 Long term (current) use of aspirin: Secondary | ICD-10-CM | POA: Diagnosis not present

## 2016-02-25 DIAGNOSIS — Z79899 Other long term (current) drug therapy: Secondary | ICD-10-CM | POA: Diagnosis not present

## 2016-02-25 DIAGNOSIS — N186 End stage renal disease: Secondary | ICD-10-CM | POA: Diagnosis not present

## 2016-02-25 DIAGNOSIS — I2510 Atherosclerotic heart disease of native coronary artery without angina pectoris: Secondary | ICD-10-CM | POA: Diagnosis not present

## 2016-02-25 DIAGNOSIS — R6889 Other general symptoms and signs: Secondary | ICD-10-CM | POA: Diagnosis not present

## 2016-02-25 DIAGNOSIS — L299 Pruritus, unspecified: Secondary | ICD-10-CM | POA: Diagnosis present

## 2016-02-25 DIAGNOSIS — L509 Urticaria, unspecified: Secondary | ICD-10-CM | POA: Diagnosis not present

## 2016-02-25 DIAGNOSIS — D509 Iron deficiency anemia, unspecified: Secondary | ICD-10-CM | POA: Diagnosis not present

## 2016-02-25 DIAGNOSIS — N2581 Secondary hyperparathyroidism of renal origin: Secondary | ICD-10-CM | POA: Diagnosis not present

## 2016-02-25 MED ORDER — HYDROXYZINE HCL 25 MG PO TABS
ORAL_TABLET | ORAL | Status: AC
  Administered 2016-02-25: 25 mg via ORAL
  Filled 2016-02-25: qty 1

## 2016-02-25 MED ORDER — HYDROXYZINE PAMOATE 25 MG PO CAPS: 25.0000 mg | ORAL_CAPSULE | Freq: Three times a day (TID) | ORAL | 0 refills | Status: DC | PRN

## 2016-02-25 MED ORDER — DEXAMETHASONE SODIUM PHOSPHATE 10 MG/ML IJ SOLN
10.0000 mg | Freq: Once | INTRAMUSCULAR | Status: DC
  Filled 2016-02-25: qty 1

## 2016-02-25 MED ORDER — HYDROXYZINE HCL 25 MG PO TABS
25.0000 mg | ORAL_TABLET | Freq: Once | ORAL | Status: AC
  Administered 2016-02-25: 25 mg via ORAL

## 2016-02-25 MED ORDER — DEXAMETHASONE SODIUM PHOSPHATE 4 MG/ML IJ SOLN
INTRAMUSCULAR | Status: AC
  Administered 2016-02-25: 10 mg via INTRAMUSCULAR
  Filled 2016-02-25: qty 3

## 2016-02-25 NOTE — Discharge Instructions (Signed)
Keep your skin well moisturized and follow up with your dialysis team tomorrow.

## 2016-02-25 NOTE — ED Provider Notes (Signed)
[facility] Emergency Department Provider Note  ____________________________________________  Time seen: Approximately 4:34 PM  I have reviewed the triage vital signs and the nursing notes.   HISTORY  Chief Complaint Pruritis    HPI Stanley [name] is a 55 y.o. male , NAD, presents to the emergency department via EMS with several week history of itching. Patient states he has had itching about his face and neck for a couple of weeks. States he has been given Benadryl by the staff during his dialysis sessions and states it works for short period of time. Denies any rash, redness or swelling. Had no difficulty breathing, swallowing. Has had no shortness of breath or wheezing. Denies any swelling about the lips/tongue/throat. Has had no swelling about his extremities. Denies chest pain or abdominal pain. Has not seen his primary care provider in regards to the symptoms. Denies any exposures to known allergens or irritants. States he was placed on a new blood pressure medication while he was in the hospital a few weeks ago but no other changes in medicines.   Past Medical History:  Diagnosis Date  . Alcohol abuse   . Cirrhosis ([place])   . Coronary artery disease   . Diabetic peripheral neuropathy ([place])   . Drug abuse   . End stage renal disease on dialysis [facility]) NEPHROLOGIST-   DR [facility]  IN [place]   HEMODIALYSIS --   TUES/  THURS/  SAT  . GERD (gastroesophageal reflux disease)   . Hypertension   . Suicidal ideation    & HOMICIDAL IDEATION --  06-16-2013   ADMITTED TO BEHAVIOR HEALTH    Patient Active Problem List   Diagnosis Date Noted  . Upper GI bleed 01/19/2016  . Blood in stool   . Angiodysplasia of stomach and duodenum with hemorrhage   . Gastritis   . Esophagitis, unspecified   . GI bleed 05/16/2015  . Acute GI bleeding   . Symptomatic anemia 04/30/2015  . HTN (hypertension) 04/06/2015  . GERD (gastroesophageal reflux disease) 04/06/2015  .  HLD (hyperlipidemia) 04/06/2015  . Dyspnea 04/06/2015  . Cirrhosis ([place]) 04/06/2015  . Ascites 04/06/2015  . GIB (gastrointestinal bleeding) 03/23/2015  . Homicidal ideation 06/19/2013  . Suicidal intent 06/19/2013  . Homicidal ideations 06/19/2013  . Hyperkalemia 06/16/2013  . Mandible fracture ([place]) 06/05/2013  . Fracture, mandible ([place]) 06/02/2013  . Coronary atherosclerosis of native coronary artery 06/02/2013  . ESRD on dialysis ([place]) 06/02/2013  . Mandible open fracture ([place]) 06/02/2013    Past Surgical History:  Procedure Laterality Date  . CORONARY ANGIOPLASTY  ?   PT UNABLE TO TELL IF  BEFORE OR AFTER  CABG  . CORONARY ARTERY BYPASS GRAFT  2008  ([place] , [place])   3 VESSEL  . DIALYSIS FISTULA CREATION  LAST SURGERY  APPOX  2008  . ESOPHAGOGASTRODUODENOSCOPY N/A 05/07/2015   Procedure: ESOPHAGOGASTRODUODENOSCOPY (EGD);  Surgeon: Hulen Luster, MD;  Location: [facility] ENDOSCOPY;  Service: Endoscopy;  Laterality: N/A;  . ESOPHAGOGASTRODUODENOSCOPY (EGD) WITH PROPOFOL N/A 05/17/2015   Procedure: ESOPHAGOGASTRODUODENOSCOPY (EGD) WITH PROPOFOL;  Surgeon: [name], MD;  Location: [facility] ENDOSCOPY;  Service: Endoscopy;  Laterality: N/A;  . ESOPHAGOGASTRODUODENOSCOPY (EGD) WITH PROPOFOL N/A 01/20/2016   Procedure: ESOPHAGOGASTRODUODENOSCOPY (EGD) WITH PROPOFOL;  Surgeon: [name], MD;  Location: [facility] ENDOSCOPY;  Service: Endoscopy;  Laterality: N/A;  . MANDIBULAR HARDWARE REMOVAL N/A 07/29/2013   Procedure: REMOVAL OF ARCH BARS;  Surgeon: Theodoro Kos, DO;  Location: [place];  Service: Plastics;  Laterality: N/A;  . ORIF MANDIBULAR FRACTURE N/A 06/05/2013   Procedure: REPAIR OF MANDIBULAR FRACTURE x 2 with maxillo-mandibular fixation ;  Surgeon: Theodoro Kos, DO;  Location: [place];  Service: Plastics;  Laterality: N/A;    Prior to Admission medications   Medication Sig Start Date End Date Taking? Authorizing Provider  amLODipine (NORVASC) 5 MG tablet Take 5 mg by mouth  daily.    Historical Provider, MD  aspirin EC 81 MG tablet Take 81 mg by mouth at bedtime.     Historical Provider, MD  b complex-vitamin c-folic acid (NEPHRO-VITE) 0.8 MG TABS tablet Take 1 tablet by mouth daily.    Historical Provider, MD  calcium acetate (PHOSLO) 667 MG capsule Take 2,001 mg by mouth 3 (three) times daily with meals.    Historical Provider, MD  cinacalcet (SENSIPAR) 30 MG tablet Take 30 mg by mouth daily with supper.    Historical Provider, MD  gabapentin (NEURONTIN) 300 MG capsule Take 1 capsule (300 mg total) by mouth daily. Patient taking differently: Take 300 mg by mouth at bedtime.  03/27/15   Bettey Costa, MD  hydrOXYzine (VISTARIL) 25 MG capsule Take 1 capsule (25 mg total) by mouth 3 (three) times daily as needed for itching. 02/25/16   Jami L Hagler, PA-C  labetalol (NORMODYNE) 100 MG tablet Take 1 tablet (100 mg total) by mouth daily. 01/22/16   Epifanio Lesches, MD  pantoprazole (PROTONIX) 40 MG tablet Take 1 tablet (40 mg total) by mouth 2 (two) times daily. 01/22/16   Epifanio Lesches, MD    Allergies Patient has no known allergies.  Family History  Problem Relation Age of Onset  . Colon cancer Mother   . Cancer Father   . Cancer Sister     Social History Social History  Substance Use Topics  . Smoking status: Current Every Day Smoker    Packs/day: 0.00    Years: 40.00    Types: Cigarettes  . Smokeless tobacco: Never Used  . Alcohol use No     Comment: pt reports quitting after learning about cirrhosis     Review of Systems  Constitutional: No fever/chills Eyes: No visual changes.  ENT: No Swelling about the lips/tongue/throat. Cardiovascular: No chest pain, Palpitations, lower show any edema. Respiratory: No cough. No shortness of breath. No wheezing.  Gastrointestinal: No abdominal pain.  No nausea, vomiting.  Musculoskeletal: Negative forGeneral myalgias, joint swelling.  Skin: Positive for itching about the face and neck. Negative for  rash or redness, skin sores, swelling. Neurological: Negative for headaches, focal weakness or numbness. 10-point ROS otherwise negative.  ____________________________________________   PHYSICAL EXAM:  VITAL SIGNS: ED Triage Vitals [02/25/16 1621]  Enc Vitals Group     BP      Pulse      Resp      Temp      Temp src      SpO2      Weight 190 lb (86.2 kg)     Height 6\' 3"  (1.905 m)     Head Circumference      Peak Flow      Pain Score      Pain Loc      Pain Edu?      Excl. in [place]?      Constitutional: Alert and oriented. Well appearing and in no acute distress. Eyes: Conjunctivae are normal. Head: Atraumatic. ENT:      Nose: No congestion/rhinnorhea.      Mouth/Throat: No swelling about the  lips/tongue/throat Neck: No stridor.  Hematological/Lymphatic/Immunilogical: No cervical lymphadenopathy. Cardiovascular: Good peripheral circulation. Respiratory: Normal respiratory effort without tachypnea or retractions.  Musculoskeletal: No lower extremity tenderness nor edema.  No joint effusions. Neurologic:  Normal speech and language. No gross focal neurologic deficits are appreciated.  Skin:  Patient consistently rubs his face and neck and there is mild skin irritation due to such. No swelling, edema noted. Skin is warm, dry and intact. No rash, skin sores noted. Psychiatric: Mood and affect are normal. Speech and behavior are normal. Patient exhibits appropriate insight and judgement.   ____________________________________________   LABS  None ____________________________________________  EKG  None ____________________________________________  RADIOLOGY  None ____________________________________________    PROCEDURES  Procedure(s) performed: None   Procedures   Medications  dexamethasone (DECADRON) injection 10 mg (10 mg Intramuscular Not Given 02/25/16 1647)  hydrOXYzine (ATARAX/VISTARIL) tablet 25 mg (25 mg Oral Given 02/25/16 1645)   dexamethasone (DECADRON) 4 MG/ML injection (10 mg Intramuscular Given 02/25/16 1645)     ____________________________________________   INITIAL IMPRESSION / ASSESSMENT AND PLAN / ED COURSE  Pertinent labs & imaging results that were available during my care of the patient were reviewed by me and considered in my medical decision making (see chart for details).  Clinical Course     Patient's diagnosis is consistent with urticaria and presumably uremic puritus. Patient has an appointment for dialysis tomorrow and plans to keep that appointment. Advised patient to discuss his itching at that time as it may be related to inadequate dialysis. At this time patient has had no chest pain, shortness of breath, abdominal pain nor swelling in his extremities. No prescription for labetalol was given to the patient to begin 4 weeks ago and he states that the itching began approximately 10 days ago. No evidence of drug born rash is noted at this time.  Patient will be discharged home with prescriptions for atarax to take as needed. Patient is to follow up with his primary care provider if symptoms persist past this treatment course. Patient is given ED precautions to return to the ED for any worsening or new symptoms.    ____________________________________________  FINAL CLINICAL IMPRESSION(S) / ED DIAGNOSES  Final diagnoses:  Urticaria      NEW MEDICATIONS STARTED DURING THIS VISIT:  Discharge Medication List as of 02/25/2016  5:06 PM    START taking these medications   Details  hydrOXYzine (VISTARIL) 25 MG capsule Take 1 capsule (25 mg total) by mouth 3 (three) times daily as needed for itching., Starting Thu 02/25/2016, Print             Braxton Feathers, PA-C 02/25/16 1715    Lisa Roca, MD 02/25/16 1918

## 2016-02-25 NOTE — ED Notes (Addendum)
PT reports " I have been itching since the last time I was in here and you guys didn't do anything about it then." Pt reports full body itching that has not changed since he was seen in ER on 11/20. Pt denies other symptoms : pain, NVD, SOB. No rash visible on pts skin. No new medications per Pt.

## 2016-02-25 NOTE — ED Triage Notes (Signed)
Pt arrived via ems, reports itching all over.  No rash noted.  No sob.

## 2016-02-27 ENCOUNTER — Emergency Department
Admission: EM | Admit: 2016-02-27 | Discharge: 2016-02-27 | Disposition: A | Discharge status: Home / Self Care | Payer: Medicare Other | Attending: Emergency Medicine | Admitting: Emergency Medicine

## 2016-02-27 ENCOUNTER — Encounter: Payer: Self-pay | Admitting: Emergency Medicine

## 2016-02-27 DIAGNOSIS — N186 End stage renal disease: Secondary | ICD-10-CM | POA: Diagnosis not present

## 2016-02-27 DIAGNOSIS — I2510 Atherosclerotic heart disease of native coronary artery without angina pectoris: Secondary | ICD-10-CM | POA: Diagnosis not present

## 2016-02-27 DIAGNOSIS — L299 Pruritus, unspecified: Secondary | ICD-10-CM | POA: Diagnosis not present

## 2016-02-27 DIAGNOSIS — F17210 Nicotine dependence, cigarettes, uncomplicated: Secondary | ICD-10-CM | POA: Insufficient documentation

## 2016-02-27 DIAGNOSIS — N2581 Secondary hyperparathyroidism of renal origin: Secondary | ICD-10-CM | POA: Diagnosis not present

## 2016-02-27 DIAGNOSIS — Z79899 Other long term (current) drug therapy: Secondary | ICD-10-CM | POA: Insufficient documentation

## 2016-02-27 DIAGNOSIS — I120 Hypertensive chronic kidney disease with stage 5 chronic kidney disease or end stage renal disease: Secondary | ICD-10-CM | POA: Insufficient documentation

## 2016-02-27 DIAGNOSIS — D509 Iron deficiency anemia, unspecified: Secondary | ICD-10-CM | POA: Diagnosis not present

## 2016-02-27 DIAGNOSIS — Z7982 Long term (current) use of aspirin: Secondary | ICD-10-CM | POA: Insufficient documentation

## 2016-02-27 DIAGNOSIS — Z992 Dependence on renal dialysis: Secondary | ICD-10-CM | POA: Diagnosis not present

## 2016-02-27 DIAGNOSIS — E1122 Type 2 diabetes mellitus with diabetic chronic kidney disease: Secondary | ICD-10-CM | POA: Insufficient documentation

## 2016-02-27 DIAGNOSIS — T7849XA Other allergy, initial encounter: Secondary | ICD-10-CM | POA: Diagnosis not present

## 2016-02-27 LAB — GLUCOSE, CAPILLARY: GLUCOSE-CAPILLARY: 98 mg/dL (ref 65–99)

## 2016-02-27 MED ORDER — DEXAMETHASONE SODIUM PHOSPHATE 10 MG/ML IJ SOLN
10.0000 mg | Freq: Once | INTRAMUSCULAR | Status: DC
  Filled 2016-02-27: qty 1

## 2016-02-27 NOTE — ED Notes (Signed)
Pt unhappy w/ time spent in ED, feels like his time spent here too long and that we are not allocating enough resources to him.  Pt unwilling to wait for medications. Pt ambulatory, resp even and unlabored. NAD.

## 2016-02-27 NOTE — ED Triage Notes (Signed)
Patient presents to the ED with itching x 2 weeks.  Patient reports being seen and being prescribed steroids and antibiotics with no relief.  Patient is in no obvious distress at this time.

## 2016-02-27 NOTE — ED Notes (Signed)
Pt reports itching x2 weeks, states that his last phosphorus level was normal.  Pt was dialyzed today and states that he has taken atarax as previously prescribed.  Pt states "Ya'll are gonna have to keep me, because I can't sleep".  Pt advised that EDP will evaluate for any acute causes for itching, but follow up will most likely have to be done with PCP.  Pt itching off and on during assessment, but no redness noted to skin and no visible signs of rash noted.  Pt breathing even and nonlabored and in NAD at this time.

## 2016-02-28 NOTE — ED Provider Notes (Signed)
[facility] Emergency Department Provider Note  ____________________________________________  Time seen: Approximately 1:25 AM  I have reviewed the triage vital signs and the nursing notes.   HISTORY  Chief Complaint Pruritis   HPI Stanley [name] is a 55 y.o. male presenting with pruritus. Patient was seen on 02/25/2016 with presumed diagnosis for uremic pruritus. Patient states that pruritus first occurred after changes were made to his hypertension regimen. Patient states that  pruritus improved after aforementioned emergency department visit as an injection of Decadron was given. Patient denies rash, shortness of breath, angioedema or swelling of any kind. Patient states that his next appointment with his nephrologist is on Tuesday.  Past Medical History:  Diagnosis Date  . Alcohol abuse   . Cirrhosis ([place])   . Coronary artery disease   . Diabetic peripheral neuropathy ([place])   . Drug abuse   . End stage renal disease on dialysis [facility]) NEPHROLOGIST-   DR [facility]  IN [place]   HEMODIALYSIS --   TUES/  THURS/  SAT  . GERD (gastroesophageal reflux disease)   . Hypertension   . Suicidal ideation    & HOMICIDAL IDEATION --  06-16-2013   ADMITTED TO BEHAVIOR HEALTH    Patient Active Problem List   Diagnosis Date Noted  . Upper GI bleed 01/19/2016  . Blood in stool   . Angiodysplasia of stomach and duodenum with hemorrhage   . Gastritis   . Esophagitis, unspecified   . GI bleed 05/16/2015  . Acute GI bleeding   . Symptomatic anemia 04/30/2015  . HTN (hypertension) 04/06/2015  . GERD (gastroesophageal reflux disease) 04/06/2015  . HLD (hyperlipidemia) 04/06/2015  . Dyspnea 04/06/2015  . Cirrhosis ([place]) 04/06/2015  . Ascites 04/06/2015  . GIB (gastrointestinal bleeding) 03/23/2015  . Homicidal ideation 06/19/2013  . Suicidal intent 06/19/2013  . Homicidal ideations 06/19/2013  . Hyperkalemia 06/16/2013  . Mandible fracture ([place]) 06/05/2013  .  Fracture, mandible ([place]) 06/02/2013  . Coronary atherosclerosis of native coronary artery 06/02/2013  . ESRD on dialysis ([place]) 06/02/2013  . Mandible open fracture ([place]) 06/02/2013    Past Surgical History:  Procedure Laterality Date  . CORONARY ANGIOPLASTY  ?   PT UNABLE TO TELL IF  BEFORE OR AFTER  CABG  . CORONARY ARTERY BYPASS GRAFT  2008  ([place] , [place])   3 VESSEL  . DIALYSIS FISTULA CREATION  LAST SURGERY  APPOX  2008  . ESOPHAGOGASTRODUODENOSCOPY N/A 05/07/2015   Procedure: ESOPHAGOGASTRODUODENOSCOPY (EGD);  Surgeon: Hulen Luster, MD;  Location[facility] ENDOSCOPY;  Service: Endoscopy;  Laterality: N/A;  . ESOPHAGOGASTRODUODENOSCOPY (EGD) WITH PROPOFOL N/A 05/17/2015   Procedure: ESOPHAGOGASTRODUODENOSCOPY (EGD) WITH PROPOFOL;  Surgeon: [name], MD;  Location: [facility] ENDOSCOPY;  Service: Endoscopy;  Laterality: N/A;  . ESOPHAGOGASTRODUODENOSCOPY (EGD) WITH PROPOFOL N/A 01/20/2016   Procedure: ESOPHAGOGASTRODUODENOSCOPY (EGD) WITH PROPOFOL;  Surgeon: [name], MD;  Location: [facility] ENDOSCOPY;  Service: Endoscopy;  Laterality: N/A;  . MANDIBULAR HARDWARE REMOVAL N/A 07/29/2013   Procedure: REMOVAL OF ARCH BARS;  Surgeon: Theodoro Kos, DO;  Location: [place];  Service: Plastics;  Laterality: N/A;  . ORIF MANDIBULAR FRACTURE N/A 06/05/2013   Procedure: REPAIR OF MANDIBULAR FRACTURE x 2 with maxillo-mandibular fixation ;  Surgeon: Theodoro Kos, DO;  Location: [place];  Service: Plastics;  Laterality: N/A;    Prior to Admission medications   Medication Sig Start Date End Date Taking? Authorizing Provider  amLODipine (NORVASC) 5 MG tablet Take 5 mg by mouth daily.  Historical Provider, MD  aspirin EC 81 MG tablet Take 81 mg by mouth at bedtime.     Historical Provider, MD  b complex-vitamin c-folic acid (NEPHRO-VITE) 0.8 MG TABS tablet Take 1 tablet by mouth daily.    Historical Provider, MD  calcium acetate (PHOSLO) 667 MG capsule Take 2,001 mg by mouth 3 (three) times daily  with meals.    Historical Provider, MD  cinacalcet (SENSIPAR) 30 MG tablet Take 30 mg by mouth daily with supper.    Historical Provider, MD  gabapentin (NEURONTIN) 300 MG capsule Take 1 capsule (300 mg total) by mouth daily. Patient taking differently: Take 300 mg by mouth at bedtime.  03/27/15   Bettey Costa, MD  hydrOXYzine (VISTARIL) 25 MG capsule Take 1 capsule (25 mg total) by mouth 3 (three) times daily as needed for itching. 02/25/16   Jami L Hagler, PA-C  labetalol (NORMODYNE) 100 MG tablet Take 1 tablet (100 mg total) by mouth daily. 01/22/16   Epifanio Lesches, MD  pantoprazole (PROTONIX) 40 MG tablet Take 1 tablet (40 mg total) by mouth 2 (two) times daily. 01/22/16   Epifanio Lesches, MD    Allergies Patient has no known allergies.  Family History  Problem Relation Age of Onset  . Colon cancer Mother   . Cancer Father   . Cancer Sister     Social History Social History  Substance Use Topics  . Smoking status: Current Every Day Smoker    Packs/day: 0.00    Years: 40.00    Types: Cigarettes  . Smokeless tobacco: Never Used  . Alcohol use No     Comment: pt reports quitting after learning about cirrhosis    Review of Systems  Constitutional: Negative for fever/chills Respiratory: Negative for shortness of breath. Musculoskeletal: Negative for pain. Skin: Has diffuse pruritus. Neurological: Negative for headaches, focal weakness or numbness. ____________________________________________   PHYSICAL EXAM:  VITAL SIGNS: ED Triage Vitals  Enc Vitals Group     BP 02/27/16 1813 138/71     Pulse Rate 02/27/16 1813 78     Resp 02/27/16 1813 20     Temp 02/27/16 1813 98.3 F (36.8 C)     Temp Source 02/27/16 1813 Oral     SpO2 02/27/16 1813 98 %     Weight 02/27/16 1813 190 lb (86.2 kg)     Height 02/27/16 1813 6\' 3"  (1.905 m)     Head Circumference --      Peak Flow --      Pain Score 02/27/16 1844 0     Pain Loc --      Pain Edu? --      Excl. in [place]? --       Constitutional: Alert and oriented. He is lying supine on the bed. He is not itching during initial exam. Eyes: Conjunctivae are normal. EOMI. No scleral icterus. Nose: No congestion/rhinnorhea. Mouth/Throat: Mucous membranes are moist. No angioedema or swelling of the face. Cardiovascular: Good peripheral circulation. Respiratory: Normal respiratory effort.  No retractions. Lungs CTAB. Neurologic:  Normal speech and language. No gross focal neurologic deficits are appreciated. Skin: No rashes visualized. No excoriations from scratching are visualized.  ____________________________________________   LABS (all labs ordered are listed, but only abnormal results are displayed)  Labs Reviewed  GLUCOSE, CAPILLARY    PROCEDURES  Procedure(s) performed:  POCT Glucose: 98 ____________________________________________   INITIAL IMPRESSION / ASSESSMENT AND PLAN / ED COURSE  Clinical Course     Pertinent labs & imaging results  that were available during my care of the patient were reviewed by me and considered in my medical decision making (see chart for details).  Assessment and plan: Pruritus: Patient states that he was tired of waiting in the emergency department. Patient left AMA.  ____________________________________________   FINAL CLINICAL IMPRESSION(S) / ED DIAGNOSES  Final diagnoses:  Itching    Discharge Medication List as of 02/27/2016  8:13 PM      Note:  This document was prepared using Dragon voice recognition software and may include unintentional dictation errors.    Lannie Fields, PA-C 02/28/16 4888    Delman Kitten, MD 02/29/16 [phone]

## 2016-03-01 DIAGNOSIS — N186 End stage renal disease: Secondary | ICD-10-CM | POA: Diagnosis not present

## 2016-03-01 DIAGNOSIS — D509 Iron deficiency anemia, unspecified: Secondary | ICD-10-CM | POA: Diagnosis not present

## 2016-03-01 DIAGNOSIS — N2581 Secondary hyperparathyroidism of renal origin: Secondary | ICD-10-CM | POA: Diagnosis not present

## 2016-03-01 DIAGNOSIS — Z992 Dependence on renal dialysis: Secondary | ICD-10-CM | POA: Diagnosis not present

## 2016-03-02 ENCOUNTER — Other Ambulatory Visit: Payer: Self-pay | Admitting: Internal Medicine

## 2016-03-02 DIAGNOSIS — R188 Other ascites: Secondary | ICD-10-CM

## 2016-03-03 DIAGNOSIS — N186 End stage renal disease: Secondary | ICD-10-CM | POA: Diagnosis not present

## 2016-03-03 DIAGNOSIS — N2581 Secondary hyperparathyroidism of renal origin: Secondary | ICD-10-CM | POA: Diagnosis not present

## 2016-03-03 DIAGNOSIS — D509 Iron deficiency anemia, unspecified: Secondary | ICD-10-CM | POA: Diagnosis not present

## 2016-03-03 DIAGNOSIS — Z992 Dependence on renal dialysis: Secondary | ICD-10-CM | POA: Diagnosis not present

## 2016-03-04 NOTE — Discharge Instructions (Signed)
Paracentesis, Care After °Introduction °Refer to this sheet in the next few weeks. These instructions provide you with information about caring for yourself after your procedure. Your health care provider may also give you more specific instructions. Your treatment has been planned according to current medical practices, but problems sometimes occur. Call your health care provider if you have any problems or questions after your procedure. °What can I expect after the procedure? °After your procedure, it is common to have a small amount of clear fluid coming from the puncture site. °Follow these instructions at home: °· Return to your normal activities as told by your health care provider. Ask your health care provider what activities are safe for you. °· Take over-the-counter and prescription medicines only as told by your health care provider. °· Do not take baths, swim, or use a hot tub until your health care provider approves. °· Follow instructions from your health care provider about: °¨ How to take care of your puncture site. °¨ When and how you should change your bandage (dressing). °¨ When you should remove your dressing. °· Check your puncture area every day signs of infection. Watch for: °¨ Redness, swelling, or pain. °¨ Fluid, blood, or pus. °· Keep all follow-up visits as told by your health care provider. This is important. °Contact a health care provider if: °· You have redness, swelling, or pain at your puncture site. °· You start to have more clear fluid coming from your puncture site. °· You have blood or pus coming from your puncture site. °· You have chills. °· You have a fever. °Get help right away if: °· You develop chest pain or shortness of breath. °· You develop increasing pain, discomfort, or swelling in your abdomen. °· You feel dizzy or light-headed or you pass out. °This information is not intended to replace advice given to you by your health care provider. Make sure you discuss any  questions you have with your health care provider. °Document Released: 07/15/2014 Document Revised: 08/06/2015 Document Reviewed: 05/13/2014 °© 2017 [employer]vier ° °

## 2016-03-05 DIAGNOSIS — Z992 Dependence on renal dialysis: Secondary | ICD-10-CM | POA: Diagnosis not present

## 2016-03-05 DIAGNOSIS — N2581 Secondary hyperparathyroidism of renal origin: Secondary | ICD-10-CM | POA: Diagnosis not present

## 2016-03-05 DIAGNOSIS — D509 Iron deficiency anemia, unspecified: Secondary | ICD-10-CM | POA: Diagnosis not present

## 2016-03-05 DIAGNOSIS — N186 End stage renal disease: Secondary | ICD-10-CM | POA: Diagnosis not present

## 2016-03-08 DIAGNOSIS — N2581 Secondary hyperparathyroidism of renal origin: Secondary | ICD-10-CM | POA: Diagnosis not present

## 2016-03-08 DIAGNOSIS — D509 Iron deficiency anemia, unspecified: Secondary | ICD-10-CM | POA: Diagnosis not present

## 2016-03-08 DIAGNOSIS — N186 End stage renal disease: Secondary | ICD-10-CM | POA: Diagnosis not present

## 2016-03-08 DIAGNOSIS — Z992 Dependence on renal dialysis: Secondary | ICD-10-CM | POA: Diagnosis not present

## 2016-03-09 ENCOUNTER — Ambulatory Visit
Admission: RE | Admit: 2016-03-09 | Discharge: 2016-03-09 | Disposition: A | Discharge status: Home / Self Care | Payer: Medicare Other | Source: Ambulatory Visit | Attending: Internal Medicine | Admitting: Internal Medicine

## 2016-03-09 DIAGNOSIS — R188 Other ascites: Secondary | ICD-10-CM | POA: Diagnosis not present

## 2016-03-10 DIAGNOSIS — N186 End stage renal disease: Secondary | ICD-10-CM | POA: Diagnosis not present

## 2016-03-10 DIAGNOSIS — D509 Iron deficiency anemia, unspecified: Secondary | ICD-10-CM | POA: Diagnosis not present

## 2016-03-10 DIAGNOSIS — N2581 Secondary hyperparathyroidism of renal origin: Secondary | ICD-10-CM | POA: Diagnosis not present

## 2016-03-10 DIAGNOSIS — Z992 Dependence on renal dialysis: Secondary | ICD-10-CM | POA: Diagnosis not present

## 2016-03-12 DIAGNOSIS — N186 End stage renal disease: Secondary | ICD-10-CM | POA: Diagnosis not present

## 2016-03-12 DIAGNOSIS — N2581 Secondary hyperparathyroidism of renal origin: Secondary | ICD-10-CM | POA: Diagnosis not present

## 2016-03-12 DIAGNOSIS — Z992 Dependence on renal dialysis: Secondary | ICD-10-CM | POA: Diagnosis not present

## 2016-03-12 DIAGNOSIS — D509 Iron deficiency anemia, unspecified: Secondary | ICD-10-CM | POA: Diagnosis not present

## 2016-03-13 DIAGNOSIS — N186 End stage renal disease: Secondary | ICD-10-CM | POA: Diagnosis not present

## 2016-03-13 DIAGNOSIS — Z992 Dependence on renal dialysis: Secondary | ICD-10-CM | POA: Diagnosis not present

## 2016-03-15 DIAGNOSIS — Z992 Dependence on renal dialysis: Secondary | ICD-10-CM | POA: Diagnosis not present

## 2016-03-15 DIAGNOSIS — N2581 Secondary hyperparathyroidism of renal origin: Secondary | ICD-10-CM | POA: Diagnosis not present

## 2016-03-15 DIAGNOSIS — D509 Iron deficiency anemia, unspecified: Secondary | ICD-10-CM | POA: Diagnosis not present

## 2016-03-15 DIAGNOSIS — N186 End stage renal disease: Secondary | ICD-10-CM | POA: Diagnosis not present

## 2016-03-17 DIAGNOSIS — N186 End stage renal disease: Secondary | ICD-10-CM | POA: Diagnosis not present

## 2016-03-17 DIAGNOSIS — D509 Iron deficiency anemia, unspecified: Secondary | ICD-10-CM | POA: Diagnosis not present

## 2016-03-17 DIAGNOSIS — Z992 Dependence on renal dialysis: Secondary | ICD-10-CM | POA: Diagnosis not present

## 2016-03-17 DIAGNOSIS — N2581 Secondary hyperparathyroidism of renal origin: Secondary | ICD-10-CM | POA: Diagnosis not present

## 2016-03-19 DIAGNOSIS — N2581 Secondary hyperparathyroidism of renal origin: Secondary | ICD-10-CM | POA: Diagnosis not present

## 2016-03-19 DIAGNOSIS — D509 Iron deficiency anemia, unspecified: Secondary | ICD-10-CM | POA: Diagnosis not present

## 2016-03-19 DIAGNOSIS — N186 End stage renal disease: Secondary | ICD-10-CM | POA: Diagnosis not present

## 2016-03-19 DIAGNOSIS — Z992 Dependence on renal dialysis: Secondary | ICD-10-CM | POA: Diagnosis not present

## 2016-03-20 ENCOUNTER — Emergency Department: Payer: Medicare Other

## 2016-03-20 ENCOUNTER — Emergency Department
Admission: EM | Admit: 2016-03-20 | Discharge: 2016-03-20 | Disposition: A | Discharge status: Home / Self Care | Payer: Medicare Other | Attending: Emergency Medicine | Admitting: Emergency Medicine

## 2016-03-20 ENCOUNTER — Encounter: Payer: Self-pay | Admitting: Emergency Medicine

## 2016-03-20 DIAGNOSIS — Z79899 Other long term (current) drug therapy: Secondary | ICD-10-CM | POA: Insufficient documentation

## 2016-03-20 DIAGNOSIS — R7989 Other specified abnormal findings of blood chemistry: Secondary | ICD-10-CM | POA: Diagnosis not present

## 2016-03-20 DIAGNOSIS — E1122 Type 2 diabetes mellitus with diabetic chronic kidney disease: Secondary | ICD-10-CM | POA: Diagnosis not present

## 2016-03-20 DIAGNOSIS — R05 Cough: Secondary | ICD-10-CM | POA: Diagnosis not present

## 2016-03-20 DIAGNOSIS — M549 Dorsalgia, unspecified: Secondary | ICD-10-CM | POA: Diagnosis not present

## 2016-03-20 DIAGNOSIS — I2510 Atherosclerotic heart disease of native coronary artery without angina pectoris: Secondary | ICD-10-CM | POA: Insufficient documentation

## 2016-03-20 DIAGNOSIS — I120 Hypertensive chronic kidney disease with stage 5 chronic kidney disease or end stage renal disease: Secondary | ICD-10-CM | POA: Diagnosis not present

## 2016-03-20 DIAGNOSIS — R079 Chest pain, unspecified: Secondary | ICD-10-CM | POA: Diagnosis not present

## 2016-03-20 DIAGNOSIS — F17210 Nicotine dependence, cigarettes, uncomplicated: Secondary | ICD-10-CM | POA: Diagnosis not present

## 2016-03-20 DIAGNOSIS — R778 Other specified abnormalities of plasma proteins: Secondary | ICD-10-CM

## 2016-03-20 DIAGNOSIS — Z992 Dependence on renal dialysis: Secondary | ICD-10-CM | POA: Insufficient documentation

## 2016-03-20 DIAGNOSIS — M7918 Myalgia, other site: Secondary | ICD-10-CM

## 2016-03-20 DIAGNOSIS — R1110 Vomiting, unspecified: Secondary | ICD-10-CM | POA: Diagnosis not present

## 2016-03-20 DIAGNOSIS — N186 End stage renal disease: Secondary | ICD-10-CM | POA: Insufficient documentation

## 2016-03-20 DIAGNOSIS — Z7982 Long term (current) use of aspirin: Secondary | ICD-10-CM | POA: Diagnosis not present

## 2016-03-20 DIAGNOSIS — M791 Myalgia: Secondary | ICD-10-CM | POA: Diagnosis not present

## 2016-03-20 DIAGNOSIS — R112 Nausea with vomiting, unspecified: Secondary | ICD-10-CM | POA: Diagnosis not present

## 2016-03-20 DIAGNOSIS — R0789 Other chest pain: Secondary | ICD-10-CM | POA: Diagnosis not present

## 2016-03-20 LAB — COMPREHENSIVE METABOLIC PANEL
ALK PHOS: 202 U/L — AB (ref 38–126)
ALT: 40 U/L (ref 17–63)
ANION GAP: 12 (ref 5–15)
AST: 40 U/L (ref 15–41)
Albumin: 4 g/dL (ref 3.5–5.0)
BILIRUBIN TOTAL: 1.3 mg/dL — AB (ref 0.3–1.2)
BUN: 63 mg/dL — ABNORMAL HIGH (ref 6–20)
CALCIUM: 10.4 mg/dL — AB (ref 8.9–10.3)
CO2: 25 mmol/L (ref 22–32)
Chloride: 107 mmol/L (ref 101–111)
Creatinine, Ser: 7.33 mg/dL — ABNORMAL HIGH (ref 0.61–1.24)
GFR calc non Af Amer: 7 mL/min — ABNORMAL LOW (ref >60)
GFR, EST AFRICAN AMERICAN: 9 mL/min — AB (ref >60)
Glucose, Bld: 92 mg/dL (ref 65–99)
POTASSIUM: 5.2 mmol/L — AB (ref 3.5–5.1)
SODIUM: 144 mmol/L (ref 135–145)
TOTAL PROTEIN: 8.2 g/dL — AB (ref 6.5–8.1)

## 2016-03-20 LAB — CBC WITH DIFFERENTIAL/PLATELET
BASOS ABS: 0 10*3/uL (ref 0–0.1)
BASOS PCT: 0 %
Eosinophils Absolute: 0.1 10*3/uL (ref 0–0.7)
Eosinophils Relative: 1 %
HEMATOCRIT: 35.4 % — AB (ref 40.0–52.0)
HEMOGLOBIN: 11.4 g/dL — AB (ref 13.0–18.0)
Lymphocytes Relative: 5 %
Lymphs Abs: 0.4 10*3/uL — ABNORMAL LOW (ref 1.0–3.6)
MCH: 30.9 pg (ref 26.0–34.0)
MCHC: 32.3 g/dL (ref 32.0–36.0)
MCV: 95.6 fL (ref 80.0–100.0)
Monocytes Absolute: 0.7 10*3/uL (ref 0.2–1.0)
Monocytes Relative: 9 %
NEUTROS ABS: 6.4 10*3/uL (ref 1.4–6.5)
NEUTROS PCT: 85 %
Platelets: 193 10*3/uL (ref 150–440)
RBC: 3.7 MIL/uL — AB (ref 4.40–5.90)
RDW: 18.3 % — ABNORMAL HIGH (ref 11.5–14.5)
WBC: 7.6 10*3/uL (ref 3.8–10.6)

## 2016-03-20 LAB — TROPONIN I
TROPONIN I: 0.13 ng/mL — AB (ref <0.03)
Troponin I: 0.13 ng/mL (ref <0.03)

## 2016-03-20 MED ORDER — ASPIRIN 81 MG PO CHEW
324.0000 mg | CHEWABLE_TABLET | Freq: Once | ORAL | Status: AC
  Administered 2016-03-20: 324 mg via ORAL
  Filled 2016-03-20: qty 4

## 2016-03-20 MED ORDER — IOPAMIDOL (ISOVUE-370) INJECTION 76%
75.0000 mL | Freq: Once | INTRAVENOUS | Status: AC | PRN
  Administered 2016-03-20: 75 mL via INTRAVENOUS

## 2016-03-20 MED ORDER — IBUPROFEN 800 MG PO TABS
800.0000 mg | ORAL_TABLET | Freq: Once | ORAL | Status: AC
  Administered 2016-03-20: 800 mg via ORAL
  Filled 2016-03-20: qty 1

## 2016-03-20 NOTE — ED Triage Notes (Signed)
Pt states back pain from upper to lower on both sides that started at 11am. Denies ever having back pain in the past. History of dialysis t,r,sat and had dialysis yesterday. Had an episode of vomiting in the ems.

## 2016-03-20 NOTE — ED Provider Notes (Signed)
[facility] Emergency Department Provider Note ____________________________________________   I have reviewed the triage vital signs and the triage nursing note.  HISTORY  Chief Complaint Back Pain   Historian Patient  HPI Stanley [name] is a 56 y.o. male history of coronary artery disease, cirrhosis, diabetic neuropathy and end-stage renal disease on dialysis Tuesday, Thursday, and Saturday, presents today for what sounds like some ongoing back and body pain. It sounds like it was worse today when he was just sitting there and he was coughing and then noticed that his back was hurting. He rates it at a 10 out of 10. He does not report shortness of breath although he says he has been having coughing. He states he had previously been diagnosed albuterol inhaler, but has been 3 months since he does use that. Denies fever. Denies productive cough. Denies missing dialysis, last dialysis was yesterday. Denies trauma or overuse injury. He states pain in his back started when he is basically just sitting at home. When asked where is the pain he points to his bilateral ribs sort of under the armpits on both sides and for the nurse stated it was from his shoulder blade all the way down his back. No pain down his legs. No numbness or weakness or incontinence.  Denies chest pain. Denies abdominal pain.    Past Medical History:  Diagnosis Date  . Alcohol abuse   . Cirrhosis ([place])   . Coronary artery disease   . Diabetic peripheral neuropathy ([place])   . Drug abuse   . End stage renal disease on dialysis [facility]) NEPHROLOGIST-   DR [facility]  IN [place]   HEMODIALYSIS --   TUES/  THURS/  SAT  . GERD (gastroesophageal reflux disease)   . Hypertension   . Suicidal ideation    & HOMICIDAL IDEATION --  06-16-2013   ADMITTED TO BEHAVIOR HEALTH    Patient Active Problem List   Diagnosis Date Noted  . Upper GI bleed 01/19/2016  . Blood in stool   . Angiodysplasia of stomach  and duodenum with hemorrhage   . Gastritis   . Esophagitis, unspecified   . GI bleed 05/16/2015  . Acute GI bleeding   . Symptomatic anemia 04/30/2015  . HTN (hypertension) 04/06/2015  . GERD (gastroesophageal reflux disease) 04/06/2015  . HLD (hyperlipidemia) 04/06/2015  . Dyspnea 04/06/2015  . Cirrhosis ([place]) 04/06/2015  . Ascites 04/06/2015  . GIB (gastrointestinal bleeding) 03/23/2015  . Homicidal ideation 06/19/2013  . Suicidal intent 06/19/2013  . Homicidal ideations 06/19/2013  . Hyperkalemia 06/16/2013  . Mandible fracture ([place]) 06/05/2013  . Fracture, mandible ([place]) 06/02/2013  . Coronary atherosclerosis of native coronary artery 06/02/2013  . ESRD on dialysis ([place]) 06/02/2013  . Mandible open fracture ([place]) 06/02/2013    Past Surgical History:  Procedure Laterality Date  . CORONARY ANGIOPLASTY  ?   PT UNABLE TO TELL IF  BEFORE OR AFTER  CABG  . CORONARY ARTERY BYPASS GRAFT  2008  ([place] , [place])   3 VESSEL  . DIALYSIS FISTULA CREATION  LAST SURGERY  APPOX  2008  . ESOPHAGOGASTRODUODENOSCOPY N/A 05/07/2015   Procedure: ESOPHAGOGASTRODUODENOSCOPY (EGD);  Surgeon: Hulen Luster, MD;  Location: [facility] ENDOSCOPY;  Service: Endoscopy;  Laterality: N/A;  . ESOPHAGOGASTRODUODENOSCOPY (EGD) WITH PROPOFOL N/A 05/17/2015   Procedure: ESOPHAGOGASTRODUODENOSCOPY (EGD) WITH PROPOFOL;  Surgeon: [name], MD;  Location: [facility] ENDOSCOPY;  Service: Endoscopy;  Laterality: N/A;  . ESOPHAGOGASTRODUODENOSCOPY (EGD) WITH PROPOFOL N/A 01/20/2016   Procedure: ESOPHAGOGASTRODUODENOSCOPY (EGD)  WITH PROPOFOL;  Surgeon: [name], MD;  Location[facility] ENDOSCOPY;  Service: Endoscopy;  Laterality: N/A;  . MANDIBULAR HARDWARE REMOVAL N/A 07/29/2013   Procedure: REMOVAL OF ARCH BARS;  Surgeon: Theodoro Kos, DO;  Location: [facility];  Service: Plastics;  Laterality: N/A;  . ORIF MANDIBULAR FRACTURE N/A 06/05/2013   Procedure: REPAIR OF MANDIBULAR FRACTURE x 2 with maxillo-mandibular fixation  ;  Surgeon: Theodoro Kos, DO;  Location: [facility];  Service: Plastics;  Laterality: N/A;    Prior to Admission medications   Medication Sig Start Date End Date Taking? Authorizing Provider  amLODipine (NORVASC) 5 MG tablet Take 5 mg by mouth daily.   Yes Historical Provider, MD  aspirin EC 81 MG tablet Take 81 mg by mouth at bedtime.    Yes Historical Provider, MD  b complex-vitamin c-folic acid (NEPHRO-VITE) 0.8 MG TABS tablet Take 1 tablet by mouth daily.   Yes Historical Provider, MD  calcium acetate (PHOSLO) 667 MG capsule Take 2,001 mg by mouth 3 (three) times daily with meals.   Yes Historical Provider, MD  cinacalcet (SENSIPAR) 30 MG tablet Take 30 mg by mouth daily with supper.   Yes Historical Provider, MD  gabapentin (NEURONTIN) 300 MG capsule Take 1 capsule (300 mg total) by mouth daily. Patient taking differently: Take 300 mg by mouth at bedtime.  03/27/15  Yes Bettey Costa, MD  hydrOXYzine (VISTARIL) 25 MG capsule Take 1 capsule (25 mg total) by mouth 3 (three) times daily as needed for itching. 02/25/16  Yes Jami L Hagler, PA-C  labetalol (NORMODYNE) 100 MG tablet Take 1 tablet (100 mg total) by mouth daily. 01/22/16  Yes Epifanio Lesches, MD  pantoprazole (PROTONIX) 40 MG tablet Take 1 tablet (40 mg total) by mouth 2 (two) times daily. Patient taking differently: Take 40 mg by mouth daily.  01/22/16  Yes Epifanio Lesches, MD    No Known Allergies  Family History  Problem Relation Age of Onset  . Colon cancer Mother   . Cancer Father   . Cancer Sister     Social History Social History  Substance Use Topics  . Smoking status: Current Every Day Smoker    Packs/day: 0.15    Years: 40.00    Types: Cigarettes  . Smokeless tobacco: Never Used  . Alcohol use No     Comment: pt reports quitting after learning about cirrhosis    Review of Systems  Constitutional: Negative for fever. Eyes: Negative for visual changes. ENT: Negative for sore throat. Cardiovascular:  Negative for chest pain. Respiratory: Negative for shortness of breath.  Positive for cough. Gastrointestinal: Negative for abdominal pain, vomiting and diarrhea. Genitourinary: Does not make much urine. Musculoskeletal: Back pain as per history of present illness. Skin: Negative for rash. Neurological: Negative for headache. 10 point Review of Systems otherwise negative ____________________________________________   PHYSICAL EXAM:  VITAL SIGNS: ED Triage Vitals  Enc Vitals Group     BP 03/20/16 1354 (!) 159/99     Pulse Rate 03/20/16 1354 95     Resp 03/20/16 1354 18     Temp 03/20/16 1354 98.5 F (36.9 C)     Temp Source 03/20/16 1354 Oral     SpO2 03/20/16 1354 98 %     Weight 03/20/16 1351 190 lb (86.2 kg)     Height 03/20/16 1351 6\' 3"  (1.905 m)     Head Circumference --      Peak Flow --      Pain Score 03/20/16  1351 10     Pain Loc --      Pain Edu? --      Excl. in [place]? --      Constitutional: Alert and oriented. Well appearing and in no distress. HEENT   Head: Normocephalic and atraumatic.      Eyes: Conjunctivae are normal. PERRL. Normal extraocular movements.      Ears:         Nose: No congestion/rhinnorhea.   Mouth/Throat: Mucous membranes are moist.   Neck: No stridor.  No neck pain. Cardiovascular/Chest: Normal rate, regular rhythm.  No murmurs, rubs, or gallops. Respiratory: Normal respiratory effort without tachypnea nor retractions. Breath sounds are clear and equal bilaterally. No wheezes/rales.  Mild rhonchi especially with cough. Gastrointestinal: Soft. No distention, no guarding, no rebound. Nontender to deep and superficial palpation in 4 quadrants.  Genitourinary/rectal:Deferred Musculoskeletal: Nontender with normal range of motion in all extremities. No joint effusions.  No lower extremity tenderness.  No edema. Neurologic:  Normal speech and language. No gross or focal neurologic deficits are appreciated. Skin:  Skin is warm, dry and  intact. No rash noted. Psychiatric: Mood and affect are normal. Speech and behavior are normal. Patient exhibits appropriate insight and judgment.   ____________________________________________  LABS (pertinent positives/negatives)  Labs Reviewed  COMPREHENSIVE METABOLIC PANEL - Abnormal; Notable for the following:       Result Value   Potassium 5.2 (*)    BUN 63 (*)    Creatinine, Ser 7.33 (*)    Calcium 10.4 (*)    Total Protein 8.2 (*)    Alkaline Phosphatase 202 (*)    Total Bilirubin 1.3 (*)    GFR calc non Af Amer 7 (*)    GFR calc Af Amer 9 (*)    All other components within normal limits  CBC WITH DIFFERENTIAL/PLATELET - Abnormal; Notable for the following:    RBC 3.70 (*)    Hemoglobin 11.4 (*)    HCT 35.4 (*)    RDW 18.3 (*)    Lymphs Abs 0.4 (*)    All other components within normal limits  TROPONIN I - Abnormal; Notable for the following:    Troponin I 0.13 (*)    All other components within normal limits  TROPONIN I - Abnormal; Notable for the following:    Troponin I 0.13 (*)    All other components within normal limits    ____________________________________________    EKG I, Lisa Roca, MD, the attending physician have personally viewed and interpreted all ECGs.  96 bpm. Normal sinus rhythm. Occasional PVC. Normal axis. Nonspecific ST and T-wave. ____________________________________________  RADIOLOGY All Xrays were viewed by me. Imaging interpreted by Radiologist.  Chest x-ray two-view:  IMPRESSION: Cardiomegaly and mild edema.  Chest CT angiogram:  IMPRESSION: 1. No evidence of a thoracic aortic dissection or aneurysm. 2. No acute findings. No evidence of pneumonia or pulmonary edema. 3. Mild cardiomegaly and dense coronary artery calcifications with changes from prior CABG surgery. 4. Mild mediastinal adenopathy, presumed reactive. 5. Small amount ascites, likely related to renal  failure. __________________________________________  PROCEDURES  Procedure(s) performed: None  Critical Care performed: None  ____________________________________________   ED COURSE / ASSESSMENT AND PLAN  Pertinent labs & imaging results that were available during my care of the patient were reviewed by me and considered in my medical decision making (see chart for details).   Mr. Coulthard is here for complaint of back pain, and in further discussion it seems it gets more  side rib pain bilaterally, possibly back, but not point tender over the CT or L-spine. No palpable or tender muscle spasm of the back. He is not complaining of abdominal pain unless suspicious for an intra-abdominal source of his back or side pain. He does have a bit of a cough here, but he was not complaining of shortness of breath or cough, but I'm suspicious that he may be having musculoskeletal pain due to the cough and he think that's probably correct.  I will try ibuprofen and we discussed that he is already end-stage renal.  Troponin came back elevated 0.13, which is slightly higher than previous baseline which appears to be around 0.07. His EKG shows PVCs and nonspecific finding similar to prior. However given the abnormality which is worse than previous, I did discuss with him obtaining chest CT to evaluate the aorta given the back discomfort.  I spoke with on-call nephrologist who felt that CT dye load was okay and kidney function was okay that he does not need emergency dialysis tonight, but does need it tomorrow. He is normally is Tuesday, if he goes home he will need to call the dialysis center to get fit in tomorrow.  CT chest is reassuring.  Patient feels much improved.  I spoke with on-call cardiologist Dr. Saralyn Pilar regarding patient's symptoms, laboratory studies, and imaging, and about whether to observe this patient in the hospital overnight versus repeat troponin here in the ED with discharge if  unchanged. He did recommend that if repeat troponin is unchanged, he would be okay to discharge the patient home for outpatient follow-up.   Repeat troponin unchanged, no increase at 0.13. We'll discharge home, patient knows he is to have dialysis tomorrow.    CONSULTATIONS:  Dr. [name], nephrology - recommends ok for CT, will need to do dialysis tomorrow inpatient or outpatient -- ok from standpoint of bun/cr and k+ tonight.  Dr. Saralyn Pilar, cardiology.  Patient / Family / Caregiver informed of clinical course, medical decision-making process, and agree with plan.   I discussed return precautions, follow-up instructions, and discharge instructions with patient and/or family.   ___________________________________________   FINAL CLINICAL IMPRESSION(S) / ED DIAGNOSES   Final diagnoses:  Chest pain, unspecified type  Troponin I above reference range  Musculoskeletal pain              Note: This dictation was prepared with Dragon dictation. Any transcriptional errors that result from this process are unintentional    Lisa Roca, MD 03/20/16 2128

## 2016-03-20 NOTE — ED Notes (Signed)
Patient transported to X-ray 

## 2016-03-20 NOTE — ED Notes (Signed)
Pt requesting to have bp cuff off "so he can sleep". Allowed pt to remove it. Pt up to the toilet

## 2016-03-20 NOTE — Discharge Instructions (Signed)
You were evaluated for back and side pain, and although no certain cause was found, as we discussed it seems like it's probably more muscle strain from coughing, however your heart enzyme was elevated which caused some additional evaluation which is considered to be reassuring tonight in the emergency department.  After discussing with nephrologist, they recommended that he will need to have your dialysis they changed tomorrow and to call to get fit in for dialysis tomorrow. After discussing with the on-call cardiologist Dr. Saralyn Pilar, he recommended follow up within 2 days, you may call his office or your cardiologist.  At home he may take Tylenol or ibuprofen as needed for soreness.  Return to the emergency room for any worsening symptoms's, certainly any chest pain, trouble breathing, palpitations, diving, weakness, numbness, dizziness or passing out, or any other symptoms concerning to you.

## 2016-03-20 NOTE — ED Notes (Addendum)
Pt requesting pain medicine and ice chips. Pt given ice chips, laying comfortably in bed talking on his cell phone

## 2016-03-22 DIAGNOSIS — D509 Iron deficiency anemia, unspecified: Secondary | ICD-10-CM | POA: Diagnosis not present

## 2016-03-22 DIAGNOSIS — N186 End stage renal disease: Secondary | ICD-10-CM | POA: Diagnosis not present

## 2016-03-22 DIAGNOSIS — N2581 Secondary hyperparathyroidism of renal origin: Secondary | ICD-10-CM | POA: Diagnosis not present

## 2016-03-22 DIAGNOSIS — Z992 Dependence on renal dialysis: Secondary | ICD-10-CM | POA: Diagnosis not present

## 2016-03-23 ENCOUNTER — Ambulatory Visit: Payer: Medicare Other | Admitting: Cardiology

## 2016-03-23 ENCOUNTER — Encounter: Payer: Self-pay | Admitting: *Deleted

## 2016-03-24 DIAGNOSIS — Z992 Dependence on renal dialysis: Secondary | ICD-10-CM | POA: Diagnosis not present

## 2016-03-24 DIAGNOSIS — D509 Iron deficiency anemia, unspecified: Secondary | ICD-10-CM | POA: Diagnosis not present

## 2016-03-24 DIAGNOSIS — N186 End stage renal disease: Secondary | ICD-10-CM | POA: Diagnosis not present

## 2016-03-24 DIAGNOSIS — N2581 Secondary hyperparathyroidism of renal origin: Secondary | ICD-10-CM | POA: Diagnosis not present

## 2016-03-26 DIAGNOSIS — D509 Iron deficiency anemia, unspecified: Secondary | ICD-10-CM | POA: Diagnosis not present

## 2016-03-26 DIAGNOSIS — N2581 Secondary hyperparathyroidism of renal origin: Secondary | ICD-10-CM | POA: Diagnosis not present

## 2016-03-26 DIAGNOSIS — Z992 Dependence on renal dialysis: Secondary | ICD-10-CM | POA: Diagnosis not present

## 2016-03-26 DIAGNOSIS — N186 End stage renal disease: Secondary | ICD-10-CM | POA: Diagnosis not present

## 2016-03-29 DIAGNOSIS — L821 Other seborrheic keratosis: Secondary | ICD-10-CM | POA: Diagnosis not present

## 2016-03-29 DIAGNOSIS — D485 Neoplasm of uncertain behavior of skin: Secondary | ICD-10-CM | POA: Diagnosis not present

## 2016-03-31 DIAGNOSIS — Z992 Dependence on renal dialysis: Secondary | ICD-10-CM | POA: Diagnosis not present

## 2016-03-31 DIAGNOSIS — N186 End stage renal disease: Secondary | ICD-10-CM | POA: Diagnosis not present

## 2016-03-31 DIAGNOSIS — N2581 Secondary hyperparathyroidism of renal origin: Secondary | ICD-10-CM | POA: Diagnosis not present

## 2016-03-31 DIAGNOSIS — D509 Iron deficiency anemia, unspecified: Secondary | ICD-10-CM | POA: Diagnosis not present

## 2016-04-02 DIAGNOSIS — D509 Iron deficiency anemia, unspecified: Secondary | ICD-10-CM | POA: Diagnosis not present

## 2016-04-02 DIAGNOSIS — N2581 Secondary hyperparathyroidism of renal origin: Secondary | ICD-10-CM | POA: Diagnosis not present

## 2016-04-02 DIAGNOSIS — N186 End stage renal disease: Secondary | ICD-10-CM | POA: Diagnosis not present

## 2016-04-02 DIAGNOSIS — Z992 Dependence on renal dialysis: Secondary | ICD-10-CM | POA: Diagnosis not present

## 2016-04-05 DIAGNOSIS — N2581 Secondary hyperparathyroidism of renal origin: Secondary | ICD-10-CM | POA: Diagnosis not present

## 2016-04-05 DIAGNOSIS — D509 Iron deficiency anemia, unspecified: Secondary | ICD-10-CM | POA: Diagnosis not present

## 2016-04-05 DIAGNOSIS — Z992 Dependence on renal dialysis: Secondary | ICD-10-CM | POA: Diagnosis not present

## 2016-04-05 DIAGNOSIS — N186 End stage renal disease: Secondary | ICD-10-CM | POA: Diagnosis not present

## 2016-04-07 DIAGNOSIS — N186 End stage renal disease: Secondary | ICD-10-CM | POA: Diagnosis not present

## 2016-04-07 DIAGNOSIS — Z992 Dependence on renal dialysis: Secondary | ICD-10-CM | POA: Diagnosis not present

## 2016-04-07 DIAGNOSIS — D509 Iron deficiency anemia, unspecified: Secondary | ICD-10-CM | POA: Diagnosis not present

## 2016-04-07 DIAGNOSIS — N2581 Secondary hyperparathyroidism of renal origin: Secondary | ICD-10-CM | POA: Diagnosis not present

## 2016-04-09 DIAGNOSIS — D509 Iron deficiency anemia, unspecified: Secondary | ICD-10-CM | POA: Diagnosis not present

## 2016-04-09 DIAGNOSIS — Z992 Dependence on renal dialysis: Secondary | ICD-10-CM | POA: Diagnosis not present

## 2016-04-09 DIAGNOSIS — N2581 Secondary hyperparathyroidism of renal origin: Secondary | ICD-10-CM | POA: Diagnosis not present

## 2016-04-09 DIAGNOSIS — N186 End stage renal disease: Secondary | ICD-10-CM | POA: Diagnosis not present

## 2016-04-11 ENCOUNTER — Telehealth: Payer: Self-pay | Admitting: Cardiology

## 2016-04-11 NOTE — Telephone Encounter (Signed)
Called pt to ask about 2008 CABG done in [place], [place]. Phone was off, no vm set up. Will try to call back.

## 2016-04-12 ENCOUNTER — Other Ambulatory Visit
Admission: RE | Admit: 2016-04-12 | Discharge: 2016-04-12 | Disposition: A | Discharge status: Home / Self Care | Payer: Medicare Other | Source: Ambulatory Visit | Attending: Cardiology | Admitting: Cardiology

## 2016-04-12 ENCOUNTER — Telehealth: Payer: Self-pay | Admitting: Cardiology

## 2016-04-12 ENCOUNTER — Ambulatory Visit (INDEPENDENT_AMBULATORY_CARE_PROVIDER_SITE_OTHER): Payer: Medicare Other | Admitting: Cardiology

## 2016-04-12 ENCOUNTER — Encounter: Payer: Self-pay | Admitting: Cardiology

## 2016-04-12 VITALS — BP 112/52 | HR 85 | Ht 75.0 in | Wt 186.0 lb

## 2016-04-12 DIAGNOSIS — I200 Unstable angina: Secondary | ICD-10-CM

## 2016-04-12 DIAGNOSIS — R079 Chest pain, unspecified: Secondary | ICD-10-CM

## 2016-04-12 DIAGNOSIS — I255 Ischemic cardiomyopathy: Secondary | ICD-10-CM | POA: Diagnosis not present

## 2016-04-12 DIAGNOSIS — I10 Essential (primary) hypertension: Secondary | ICD-10-CM

## 2016-04-12 DIAGNOSIS — I25110 Atherosclerotic heart disease of native coronary artery with unstable angina pectoris: Secondary | ICD-10-CM | POA: Diagnosis not present

## 2016-04-12 LAB — TROPONIN I: TROPONIN I: 0.09 ng/mL — AB (ref <0.03)

## 2016-04-12 MED ORDER — NITROGLYCERIN 0.4 MG SL SUBL: 0.4000 mg | SUBLINGUAL_TABLET | SUBLINGUAL | 6 refills | Status: DC | PRN

## 2016-04-12 NOTE — Telephone Encounter (Signed)
Troponin result was 0.09. Will forward to Dr. Yvone Neu for her review.

## 2016-04-12 NOTE — Patient Instructions (Addendum)
Medication Instructions:  Please follow up with your primary care provider for cholesterol management.  Your physician has recommended you make the following change in your medication:  1. Nitroglycerin 0.4 mg under the tongue as needed for chest pain. May repeat every 5 minutes up to a total of 3 doses if needed. Please review information below before taking first dose.    Labwork: Please go to [facility] to have some labs done today.   Testing/Procedures: Your physician has requested that you have an echocardiogram. Echocardiography is a painless test that uses sound waves to create images of your heart. It provides your doctor with information about the size and shape of your heart and how well your heart's chambers and valves are working. This procedure takes approximately one hour. There are no restrictions for this procedure.  [place]  Your caregiver has ordered a Stress Test with nuclear imaging. The purpose of this test is to evaluate the blood supply to your heart muscle. This procedure is referred to as a "Non-Invasive Stress Test." This is because other than having an IV started in your vein, nothing is inserted or "invades" your body. Cardiac stress tests are done to find areas of poor blood flow to the heart by determining the extent of coronary artery disease (CAD). Some patients exercise on a treadmill, which naturally increases the blood flow to your heart, while others who are  unable to walk on a treadmill due to physical limitations have a pharmacologic/chemical stress agent called Lexiscan . This medicine will mimic walking on a treadmill by temporarily increasing your coronary blood flow.   Please note: these test may take anywhere between 2-4 hours to complete  PLEASE REPORT TO [facility] AT THE FIRST DESK WILL DIRECT YOU WHERE TO GO  Date of Procedure:_Wednesday April 20, 2016 at 07:30AM__  Arrival Time for  Procedure:_Arrive at 07:15AM to register___  Instructions regarding medication:   __XX__:  Hold Labetolol (Normodyne) the night before procedure and morning of procedure   PLEASE NOTIFY THE OFFICE AT LEAST 24 HOURS IN ADVANCE IF YOU ARE UNABLE TO KEEP YOUR APPOINTMENT.  [phone] AND  PLEASE NOTIFY NUCLEAR MEDICINE AT [facility] AT LEAST 24 HOURS IN ADVANCE IF YOU ARE UNABLE TO KEEP YOUR APPOINTMENT. [phone]  How to prepare for your Myoview test:  1. Do not eat or drink after midnight 2. No caffeine for 24 hours prior to test 3. No smoking 24 hours prior to test. 4. Your medication may be taken with water.  If your doctor stopped a medication because of this test, do not take that medication. 5. Ladies, please do not wear dresses.  Skirts or pants are appropriate. Please wear a short sleeve shirt. 6. No perfume, cologne or lotion. 7. Wear comfortable walking shoes. No heels!   Follow-Up: Your physician recommends that you schedule a follow-up appointment after testing with Dr. Yvone Neu.   It was a pleasure seeing you today here in the office. Please do not hesitate to give [name] a call back if you have any further questions. [place], BSN    Echocardiogram An echocardiogram, or echocardiography, uses sound waves (ultrasound) to produce an image of your heart. The echocardiogram is simple, painless, obtained within a short period of time, and offers valuable information to your health care provider. The images from an echocardiogram can provide information such as:  Evidence of coronary artery disease (CAD).  Heart size.  Heart muscle  function.  Heart valve function.  Aneurysm detection.  Evidence of a past heart attack.  Fluid buildup around the heart.  Heart muscle thickening.  Assess heart valve function. Tell a health care provider about:  Any allergies you have.  All medicines you are taking, including vitamins, herbs, eye drops, creams, and  over-the-counter medicines.  Any problems you or family members have had with anesthetic medicines.  Any blood disorders you have.  Any surgeries you have had.  Any medical conditions you have.  Whether you are pregnant or may be pregnant. What happens before the procedure? No special preparation is needed. Eat and drink normally. What happens during the procedure?  In order to produce an image of your heart, gel will be applied to your chest and a wand-like tool (transducer) will be moved over your chest. The gel will help transmit the sound waves from the transducer. The sound waves will harmlessly bounce off your heart to allow the heart images to be captured in real-time motion. These images will then be recorded.  You may need an IV to receive a medicine that improves the quality of the pictures. What happens after the procedure? You may return to your normal schedule including diet, activities, and medicines, unless your health care provider tells you otherwise. This information is not intended to replace advice given to you by your health care provider. Make sure you discuss any questions you have with your health care provider. Document Released: 02/26/2000 Document Revised: 10/17/2015 Document Reviewed: 11/05/2012 Elsevier Interactive Patient Education  2017 [place].  Pharmacologic Stress Electrocardiogram A pharmacologic stress electrocardiogram is a heart (cardiac) test that uses nuclear imaging to evaluate the blood supply to your heart. This test may also be called a pharmacologic stress electrocardiography. Pharmacologic means that a medicine is used to increase your heart rate and blood pressure.  This stress test is done to find areas of poor blood flow to the heart by determining the extent of coronary artery disease (CAD). Some people exercise on a treadmill, which naturally increases the blood flow to the heart. For those people unable to exercise on a treadmill, a  medicine is used. This medicine stimulates your heart and will cause your heart to beat harder and more quickly, as if you were exercising.  Pharmacologic stress tests can help determine:  The adequacy of blood flow to your heart during increased levels of activity in order to clear you for discharge home.  The extent of coronary artery blockage caused by CAD.  Your prognosis if you have suffered a heart attack.  The effectiveness of cardiac procedures done, such as an angioplasty, which can increase the circulation in your coronary arteries.  Causes of chest pain or pressure. LET [facility] CARE PROVIDER KNOW ABOUT:  Any allergies you have.  All medicines you are taking, including vitamins, herbs, eye drops, creams, and over-the-counter medicines.  Previous problems you or members of your family have had with the use of anesthetics.  Any blood disorders you have.  Previous surgeries you have had.  Medical conditions you have.  Possibility of pregnancy, if this applies.  If you are currently breastfeeding. RISKS AND COMPLICATIONS Generally, this is a safe procedure. However, as with any procedure, complications can occur. Possible complications include:  You develop pain or pressure in the following areas:  Chest.  Jaw or neck.  Between your shoulder blades.  Radiating down your left arm.  Headache.  Dizziness or light-headedness.  Shortness of breath.  Increased or irregular heartbeat.  Low blood pressure.  Nausea or vomiting.  Flushing.  Redness going up the arm and slight pain during injection of medicine.  Heart attack (rare). BEFORE THE PROCEDURE   Avoid all forms of caffeine for 24 hours before your test or as directed by your health care provider. This includes coffee, tea (even decaffeinated tea), caffeinated sodas, chocolate, cocoa, and certain pain medicines.  Follow your health care provider's instructions regarding eating and drinking before  the test.  Take your medicines as directed at regular times with water unless instructed otherwise. Exceptions may include:  If you have diabetes, ask how you are to take your insulin or pills. It is common to adjust insulin dosing the morning of the test.  If you are taking beta-blocker medicines, it is important to talk to your health care provider about these medicines well before the date of your test. Taking beta-blocker medicines may interfere with the test. In some cases, these medicines need to be changed or stopped 24 hours or more before the test.  If you wear a nitroglycerin patch, it may need to be removed prior to the test. Ask your health care provider if the patch should be removed before the test.  If you use an inhaler for any breathing condition, bring it with you to the test.  If you are an outpatient, bring a snack so you can eat right after the stress phase of the test.  Do not smoke for 4 hours prior to the test or as directed by your health care provider.  Do not apply lotions, powders, creams, or oils on your chest prior to the test.  Wear comfortable shoes and clothing. Let your health care provider know if you were unable to complete or follow the preparations for your test. PROCEDURE   Multiple patches (electrodes) will be put on your chest. If needed, small areas of your chest may be shaved to get better contact with the electrodes. Once the electrodes are attached to your body, multiple wires will be attached to the electrodes, and your heart rate will be monitored.  An IV access will be started. A nuclear trace (isotope) is given. The isotope may be given intravenously, or it may be swallowed. Nuclear refers to several types of radioactive isotopes, and the nuclear isotope lights up the arteries so that the nuclear images are clear. The isotope is absorbed by your body. This results in low radiation exposure.  A resting nuclear image is taken to show how your  heart functions at rest.  A medicine is given through the IV access.  A second scan is done about 1 hour after the medicine injection and determines how your heart functions under stress.  During this stress phase, you will be connected to an electrocardiogram machine. Your blood pressure and oxygen levels will be monitored. AFTER THE PROCEDURE   Your heart rate and blood pressure will be monitored after the test.  You may return to your normal schedule, including diet,activities, and medicines, unless your health care provider tells you otherwise. This information is not intended to replace advice given to you by your health care provider. Make sure you discuss any questions you have with your health care provider. Document Released: 07/17/2008 Document Revised: 03/05/2013 Document Reviewed: 11/05/2012 Elsevier Interactive Patient Education  2017 [place].  Nitroglycerin sublingual tablets What is this medicine? NITROGLYCERIN (nye troe GLI ser in) is a type of vasodilator. It relaxes blood vessels, increasing the blood  and oxygen supply to your heart. This medicine is used to relieve chest pain caused by angina. It is also used to prevent chest pain before activities like climbing stairs, going outdoors in cold weather, or sexual activity. This medicine may be used for other purposes; ask your health care provider or pharmacist if you have questions. COMMON BRAND NAME(S): Nitroquick, Nitrostat, Nitrotab What should I tell my health care provider before I take this medicine? They need to know if you have any of these conditions: -anemia -head injury, recent stroke, or bleeding in the brain -liver disease -previous heart attack -an unusual or allergic reaction to nitroglycerin, other medicines, foods, dyes, or preservatives -pregnant or trying to get pregnant -breast-feeding How should I use this medicine? Take this medicine by mouth as needed. At the first sign of an angina  attack (chest pain or tightness) place one tablet under your tongue. You can also take this medicine 5 to 10 minutes before an event likely to produce chest pain. Follow the directions on the prescription label. Let the tablet dissolve under the tongue. Do not swallow whole. Replace the dose if you accidentally swallow it. It will help if your mouth is not dry. Saliva around the tablet will help it to dissolve more quickly. Do not eat or drink, smoke or chew tobacco while a tablet is dissolving. If you are not better within 5 minutes after taking ONE dose of nitroglycerin, call 9-1-1 immediately to seek emergency medical care. Do not take more than 3 nitroglycerin tablets over 15 minutes. If you take this medicine often to relieve symptoms of angina, your doctor or health care professional may provide you with different instructions to manage your symptoms. If symptoms do not go away after following these instructions, it is important to call 9-1-1 immediately. Do not take more than 3 nitroglycerin tablets over 15 minutes. Talk to your pediatrician regarding the use of this medicine in children. Special care may be needed. Overdosage: If you think you have taken too much of this medicine contact a poison control center or emergency room at once. NOTE: This medicine is only for you. Do not share this medicine with others. What if I miss a dose? This does not apply. This medicine is only used as needed. What may interact with this medicine? Do not take this medicine with any of the following medications: -certain migraine medicines like ergotamine and dihydroergotamine (DHE) -medicines used to treat erectile dysfunction like sildenafil, tadalafil, and vardenafil -riociguat This medicine may also interact with the following medications: -alteplase -aspirin -heparin -medicines for high blood pressure -medicines for mental depression -other medicines used to treat angina -phenothiazines like  chlorpromazine, mesoridazine, prochlorperazine, thioridazine This list may not describe all possible interactions. Give your health care provider a list of all the medicines, herbs, non-prescription drugs, or dietary supplements you use. Also tell them if you smoke, drink alcohol, or use illegal drugs. Some items may interact with your medicine. What should I watch for while using this medicine? Tell your doctor or health care professional if you feel your medicine is no longer working. Keep this medicine with you at all times. Sit or lie down when you take your medicine to prevent falling if you feel dizzy or faint after using it. Try to remain calm. This will help you to feel better faster. If you feel dizzy, take several deep breaths and lie down with your feet propped up, or bend forward with your head resting between your knees. You  may get drowsy or dizzy. Do not drive, use machinery, or do anything that needs mental alertness until you know how this drug affects you. Do not stand or sit up quickly, especially if you are an older patient. This reduces the risk of dizzy or fainting spells. Alcohol can make you more drowsy and dizzy. Avoid alcoholic drinks. Do not treat yourself for coughs, colds, or pain while you are taking this medicine without asking your doctor or health care professional for advice. Some ingredients may increase your blood pressure. What side effects may I notice from receiving this medicine? Side effects that you should report to your doctor or health care professional as soon as possible: -blurred vision -dry mouth -skin rash -sweating -the feeling of extreme pressure in the head -unusually weak or tired Side effects that usually do not require medical attention (report to your doctor or health care professional if they continue or are bothersome): -flushing of the face or neck -headache -irregular heartbeat, palpitations -nausea, vomiting This list may not describe  all possible side effects. Call your doctor for medical advice about side effects. You may report side effects to FDA at 1-800-FDA-1088. Where should I keep my medicine? Keep out of the reach of children. Store at room temperature between 20 and 25 degrees C (68 and 77 degrees F). Store in [employer]. Protect from light and moisture. Keep tightly closed. Throw away any unused medicine after the expiration date. NOTE: This sheet is a summary. It may not cover all possible information. If you have questions about this medicine, talk to your doctor, pharmacist, or health care provider.  2017 Elsevier/Gold Standard (2012-12-27 17:57:36)

## 2016-04-12 NOTE — Progress Notes (Signed)
Cardiology Office Note   Date:  04/12/2016   ID:  Stanley [name], DOB [birth year], MRN [id number]  Referring Doctor:  Dagoberto Ligas, MD   Cardiologist:   Wende Bushy, MD   Reason for consultation:  Chief Complaint  Patient presents with  . other    New pt referred by [facility] due to chest pain.      History of Present Illness: Stanley [name] is a 56 y.o. male who presents for Evaluation of chest pain  Patient presented to the ER 03/20/2016 for chest pain. The patient was evaluated by Dr. Lisa Roca. Troponin was 0.13. Call was made to on call cardiologist Dr. Saralyn Pilar. Per medical records, he did recommend that if repeat troponin is unchanged, he would be okay to discharge the patient home for follow-up as an outpatient. Repeat troponin was 0.13.  Pt is referred to our practice.  He is known to have coronary artery disease status post CABG x 3 in 2009. He was supposed to be following up with Dr. Clayborn Bigness but has not done so in many years. The last time was probably in 2010. He does not know why he is not on cholesterol medication. He continues to smoke maybe 4 cigarettes a day.  Chest pain that he's had has been going on for 2 weeks now. At its worst it was 7 out of 10-8 out of 10 in severity, central chest, nonradiating, associated with shortness of breath, lasting several minutes at a time, exertional in nature. He remembers having similar symptoms prior to having a heart attack while he was at dialysis. During the heart attack itself, he had significant shortness of breath but no chest pain. Currently no chest pain.  Denies PND, orthopnea, edema, loss of consciousness, palpitations   ROS:  Please see the history of present illness. Aside from mentioned under HPI, all other systems are reviewed and negative.     Past Medical History:  Diagnosis Date  . Alcohol abuse   . Cirrhosis ([place])   . Coronary artery disease 2009  . Diabetic peripheral neuropathy ([place])     . Drug abuse   . End stage renal disease on dialysis [facility]) NEPHROLOGIST-   DR [facility]  IN [place]   HEMODIALYSIS --   TUES/  THURS/  SAT  . GERD (gastroesophageal reflux disease)   . Hyperlipidemia   . Hypertension   . PAD (peripheral artery disease) ([place])   . Suicidal ideation    & HOMICIDAL IDEATION --  06-16-2013   ADMITTED TO BEHAVIOR HEALTH    Past Surgical History:  Procedure Laterality Date  . CORONARY ANGIOPLASTY  ?   PT UNABLE TO TELL IF  BEFORE OR AFTER  CABG  . CORONARY ARTERY BYPASS GRAFT  2008  ([place] , [place])   3 VESSEL  . DIALYSIS FISTULA CREATION  LAST SURGERY  APPOX  2008  . ESOPHAGOGASTRODUODENOSCOPY N/A 05/07/2015   Procedure: ESOPHAGOGASTRODUODENOSCOPY (EGD);  Surgeon: Hulen Luster, MD;  Location: [facility] ENDOSCOPY;  Service: Endoscopy;  Laterality: N/A;  . ESOPHAGOGASTRODUODENOSCOPY (EGD) WITH PROPOFOL N/A 05/17/2015   Procedure: ESOPHAGOGASTRODUODENOSCOPY (EGD) WITH PROPOFOL;  Surgeon: [name], MD;  Location: [facility] ENDOSCOPY;  Service: Endoscopy;  Laterality: N/A;  . ESOPHAGOGASTRODUODENOSCOPY (EGD) WITH PROPOFOL N/A 01/20/2016   Procedure: ESOPHAGOGASTRODUODENOSCOPY (EGD) WITH PROPOFOL;  Surgeon: [name], MD;  Location: [facility] ENDOSCOPY;  Service: Endoscopy;  Laterality: N/A;  . MANDIBULAR HARDWARE REMOVAL N/A 07/29/2013   Procedure: REMOVAL OF ARCH BARS;  Surgeon: Theodoro Kos, DO;  Location: [place];  Service: Plastics;  Laterality: N/A;  . ORIF MANDIBULAR FRACTURE N/A 06/05/2013   Procedure: REPAIR OF MANDIBULAR FRACTURE x 2 with maxillo-mandibular fixation ;  Surgeon: Theodoro Kos, DO;  Location: [place];  Service: Plastics;  Laterality: N/A;  . PERIPHERAL ARTERIAL STENT GRAFT Left      reports that he has been smoking Cigarettes.  He has a 6.00 pack-year smoking history. He has never used smokeless tobacco. He reports that he uses drugs, including Codeine, about 7 times per week. He reports that he does not drink alcohol.   family history  includes Cancer in his father and sister; Colon cancer in his mother.   Outpatient Medications Prior to Visit  Medication Sig Dispense Refill  . amLODipine (NORVASC) 5 MG tablet Take 5 mg by mouth daily.    Marland Kitchen aspirin EC 81 MG tablet Take 81 mg by mouth at bedtime.     Marland Kitchen b complex-vitamin c-folic acid (NEPHRO-VITE) 0.8 MG TABS tablet Take 1 tablet by mouth daily.    . calcium acetate (PHOSLO) 667 MG capsule Take 2,001 mg by mouth 3 (three) times daily with meals.    . cinacalcet (SENSIPAR) 30 MG tablet Take 30 mg by mouth daily with supper.    . gabapentin (NEURONTIN) 300 MG capsule Take 1 capsule (300 mg total) by mouth daily. (Patient taking differently: Take 300 mg by mouth at bedtime. ) 30 capsule 0  . hydrOXYzine (VISTARIL) 25 MG capsule Take 1 capsule (25 mg total) by mouth 3 (three) times daily as needed for itching. 21 capsule 0  . labetalol (NORMODYNE) 100 MG tablet Take 1 tablet (100 mg total) by mouth daily. 30 tablet 0  . pantoprazole (PROTONIX) 40 MG tablet Take 1 tablet (40 mg total) by mouth 2 (two) times daily. (Patient taking differently: Take 40 mg by mouth daily. ) 60 tablet 0   No facility-administered medications prior to visit.      Allergies: Patient has no known allergies.    PHYSICAL EXAM: VS:  BP (!) 112/52 (BP Location: Left Arm, Patient Position: Sitting, Cuff Size: Normal)   Pulse 85   Ht 6\' 3"  (1.905 m)   Wt 186 lb (84.4 kg)   BMI 23.25 kg/m  , Body mass index is 23.25 kg/m. Wt Readings from Last 3 Encounters:  04/12/16 186 lb (84.4 kg)  03/20/16 190 lb (86.2 kg)  02/27/16 190 lb (86.2 kg)    GENERAL:  well developed, well nourished, not in acute distress HEENT: normocephalic, pink conjunctivae, anicteric sclerae, no xanthelasma, normal dentition, oropharynx clear NECK:  no neck vein engorgement, JVP normal, no hepatojugular reflux, carotid upstroke brisk and symmetric, no bruit, no thyromegaly, no lymphadenopathy LUNGS:  good respiratory effort, clear  to auscultation bilaterally CV:  PMI not displaced, no thrills, no lifts, S1 and S2 within normal limits, no palpable S3 or S4, no murmurs, no rubs, no gallops ABD:  Soft, nontender, nondistended, normoactive bowel sounds, no abdominal aortic bruit, no hepatomegaly, no splenomegaly MS: nontender back, no kyphosis, no scoliosis, no joint deformities EXT:  2+ DP/PT pulses, no edema, no varicosities, no cyanosis, no clubbing SKIN: warm, nondiaphoretic, normal turgor, no ulcers NEUROPSYCH: alert, oriented to person, place, and time, sensory/motor grossly intact, normal mood, appropriate affect  Recent Labs: 05/16/2015: Magnesium 2.3 03/20/2016: ALT 40; BUN 63; Creatinine, Ser 7.33; Hemoglobin 11.4; Platelets 193; Potassium 5.2; Sodium 144   Lipid Panel No results found for: CHOL, TRIG, HDL, CHOLHDL, VLDL, LDLCALC, LDLDIRECT  Other studies Reviewed:  EKG:  The ekg from 04/12/2016 was personally reviewed by me and it revealed sinus rhythm, 85 BPM. LVH with repolarization abnormality.  Additional studies/ records that were reviewed personally reviewed by me today include:  Echo 10/26/2015,  [facility] health care: Technically difficult study due to chest wall/lung interference   Mildly decreased left ventricular systolic function, ejection fraction   45 to 50%   Segmental wall motion abnormality - inferior thinning and akinesis   Dilated left ventricle - mild   Diastolic dysfunction - grade III (severely elevated filling pressures)   Dilated left atrium - moderate to severe   Mitral regurgitation - severe   Degenerative mitral valve disease   Aortic sclerosis   Elevated pulmonary artery systolic pressure - moderate   Mildly decreased right ventricular systolic function   Dilated right ventricle - mild   Dilated right atrium - moderate   Elevated right atrial pressure   Nuclear stress test 08/05/2011, outside hospital: Evidence of significant known CAD. Probable scar  artifact less likely, but no clear evidence of ischemia. Medical treatment recommended. EF 44% Inferior apical scar  ASSESSMENT AND PLAN:  CAD s/p CABG x 3 CP, possible unstable angina Unfortunately, patient has not been following up with cardiology for many years now, after bypass.  Recommend echocardiogram, and nuclear stress test if troponin today is not any higher than the last troponin from the ER. Patient prescribed NTG SL prn for chest pain. Patient instructed to call 911 for unrelenting chest pain. Continue aspirin, beta blocker. Unsure for how long he's not been on a cholesterol medication. It might be related to concern for possible cirrhosis. Per medical records from [facility], they're not concerned about cirrhosis. We will leave it up to PCP to start statin therapy, preferably atorvastatin 40 mg by mouth daily at bed time. Follow lipid panel.  Cardiomyopathy, EF of 45-50%, from outside echo August 2017, likely ischemic cardiomyopathy No evidence of CHF on physical examination. Recommend repeat echocardiogram. Continue medical therapy.  Hypertension BP is well controlled. Continue monitoring BP. Continue current medical therapy and lifestyle changes.  Tobacco use We discussed the importance of smoking cessation and different strategies for quitting.    Current medicines are reviewed at length with the patient today.  The patient does not have concerns regarding medicines.  Labs/ tests ordered today include:  Orders Placed This Encounter  Procedures  . NM Myocar Multi W/Spect W/Wall Motion / EF  . Troponin I  . EKG 12-Lead  . ECHOCARDIOGRAM COMPLETE    I had a lengthy and detailed discussion with the patient regarding diagnoses, prognosis, diagnostic options, treatment options , and side effects of medications.   I counseled the patient on importance of lifestyle modification including heart healthy diet, regular physical activity Once cardiac workup completed , and  smoking cessation.   Disposition:   FU with undersigned after tests   Thank you for this consultation. We will forwarding this consultation to referring physician.   Signed, Wende Bushy, MD  04/12/2016 2:35 PM    [place]  This note was generated in part with voice recognition software and I apologize for any typographical errors that were not detected and corrected.

## 2016-04-13 DIAGNOSIS — Z992 Dependence on renal dialysis: Secondary | ICD-10-CM | POA: Diagnosis not present

## 2016-04-13 DIAGNOSIS — N186 End stage renal disease: Secondary | ICD-10-CM | POA: Diagnosis not present

## 2016-04-14 DIAGNOSIS — D631 Anemia in chronic kidney disease: Secondary | ICD-10-CM | POA: Diagnosis not present

## 2016-04-14 DIAGNOSIS — D509 Iron deficiency anemia, unspecified: Secondary | ICD-10-CM | POA: Diagnosis not present

## 2016-04-14 DIAGNOSIS — N2581 Secondary hyperparathyroidism of renal origin: Secondary | ICD-10-CM | POA: Diagnosis not present

## 2016-04-14 DIAGNOSIS — Z992 Dependence on renal dialysis: Secondary | ICD-10-CM | POA: Diagnosis not present

## 2016-04-14 DIAGNOSIS — N186 End stage renal disease: Secondary | ICD-10-CM | POA: Diagnosis not present

## 2016-04-15 ENCOUNTER — Inpatient Hospital Stay
Admission: EM | Admit: 2016-04-15 | Discharge: 2016-04-17 | DRG: 377 | Discharge status: Against Medical Advice | Payer: Medicare Other | Attending: Internal Medicine | Admitting: Internal Medicine

## 2016-04-15 DIAGNOSIS — R778 Other specified abnormalities of plasma proteins: Secondary | ICD-10-CM

## 2016-04-15 DIAGNOSIS — I248 Other forms of acute ischemic heart disease: Secondary | ICD-10-CM | POA: Diagnosis present

## 2016-04-15 DIAGNOSIS — E1122 Type 2 diabetes mellitus with diabetic chronic kidney disease: Secondary | ICD-10-CM | POA: Diagnosis present

## 2016-04-15 DIAGNOSIS — I132 Hypertensive heart and chronic kidney disease with heart failure and with stage 5 chronic kidney disease, or end stage renal disease: Secondary | ICD-10-CM | POA: Diagnosis present

## 2016-04-15 DIAGNOSIS — R6889 Other general symptoms and signs: Secondary | ICD-10-CM | POA: Diagnosis not present

## 2016-04-15 DIAGNOSIS — I2510 Atherosclerotic heart disease of native coronary artery without angina pectoris: Secondary | ICD-10-CM | POA: Diagnosis present

## 2016-04-15 DIAGNOSIS — E1151 Type 2 diabetes mellitus with diabetic peripheral angiopathy without gangrene: Secondary | ICD-10-CM | POA: Diagnosis not present

## 2016-04-15 DIAGNOSIS — Z992 Dependence on renal dialysis: Secondary | ICD-10-CM

## 2016-04-15 DIAGNOSIS — D500 Iron deficiency anemia secondary to blood loss (chronic): Secondary | ICD-10-CM | POA: Diagnosis not present

## 2016-04-15 DIAGNOSIS — R531 Weakness: Secondary | ICD-10-CM | POA: Diagnosis not present

## 2016-04-15 DIAGNOSIS — D631 Anemia in chronic kidney disease: Secondary | ICD-10-CM | POA: Diagnosis present

## 2016-04-15 DIAGNOSIS — F17210 Nicotine dependence, cigarettes, uncomplicated: Secondary | ICD-10-CM | POA: Diagnosis present

## 2016-04-15 DIAGNOSIS — Z951 Presence of aortocoronary bypass graft: Secondary | ICD-10-CM

## 2016-04-15 DIAGNOSIS — Z7982 Long term (current) use of aspirin: Secondary | ICD-10-CM

## 2016-04-15 DIAGNOSIS — I252 Old myocardial infarction: Secondary | ICD-10-CM

## 2016-04-15 DIAGNOSIS — N186 End stage renal disease: Secondary | ICD-10-CM | POA: Diagnosis not present

## 2016-04-15 DIAGNOSIS — E1142 Type 2 diabetes mellitus with diabetic polyneuropathy: Secondary | ICD-10-CM | POA: Diagnosis not present

## 2016-04-15 DIAGNOSIS — D62 Acute posthemorrhagic anemia: Secondary | ICD-10-CM | POA: Diagnosis present

## 2016-04-15 DIAGNOSIS — E785 Hyperlipidemia, unspecified: Secondary | ICD-10-CM | POA: Diagnosis present

## 2016-04-15 DIAGNOSIS — K922 Gastrointestinal hemorrhage, unspecified: Secondary | ICD-10-CM | POA: Diagnosis not present

## 2016-04-15 DIAGNOSIS — M7989 Other specified soft tissue disorders: Secondary | ICD-10-CM | POA: Diagnosis not present

## 2016-04-15 DIAGNOSIS — Z79899 Other long term (current) drug therapy: Secondary | ICD-10-CM

## 2016-04-15 DIAGNOSIS — K31811 Angiodysplasia of stomach and duodenum with bleeding: Principal | ICD-10-CM | POA: Diagnosis present

## 2016-04-15 DIAGNOSIS — K219 Gastro-esophageal reflux disease without esophagitis: Secondary | ICD-10-CM | POA: Diagnosis present

## 2016-04-15 DIAGNOSIS — F1010 Alcohol abuse, uncomplicated: Secondary | ICD-10-CM

## 2016-04-15 DIAGNOSIS — N2581 Secondary hyperparathyroidism of renal origin: Secondary | ICD-10-CM | POA: Diagnosis present

## 2016-04-15 DIAGNOSIS — R7989 Other specified abnormal findings of blood chemistry: Secondary | ICD-10-CM

## 2016-04-15 DIAGNOSIS — Z9889 Other specified postprocedural states: Secondary | ICD-10-CM

## 2016-04-15 LAB — BASIC METABOLIC PANEL
Anion gap: 13 (ref 5–15)
BUN: 37 mg/dL — ABNORMAL HIGH (ref 6–20)
CO2: 30 mmol/L (ref 22–32)
Calcium: 8.9 mg/dL (ref 8.9–10.3)
Chloride: 96 mmol/L — ABNORMAL LOW (ref 101–111)
Creatinine, Ser: 8.75 mg/dL — ABNORMAL HIGH (ref 0.61–1.24)
GFR calc non Af Amer: 6 mL/min — ABNORMAL LOW (ref >60)
GFR, EST AFRICAN AMERICAN: 7 mL/min — AB (ref >60)
Glucose, Bld: 112 mg/dL — ABNORMAL HIGH (ref 65–99)
POTASSIUM: 3.6 mmol/L (ref 3.5–5.1)
Sodium: 139 mmol/L (ref 135–145)

## 2016-04-15 LAB — CBC
HEMATOCRIT: 20 % — AB (ref 40.0–52.0)
HEMOGLOBIN: 6.2 g/dL — AB (ref 13.0–18.0)
MCH: 29.6 pg (ref 26.0–34.0)
MCHC: 31.1 g/dL — ABNORMAL LOW (ref 32.0–36.0)
MCV: 95.2 fL (ref 80.0–100.0)
Platelets: 269 10*3/uL (ref 150–440)
RBC: 2.1 MIL/uL — AB (ref 4.40–5.90)
RDW: 18 % — ABNORMAL HIGH (ref 11.5–14.5)
WBC: 7.1 10*3/uL (ref 3.8–10.6)

## 2016-04-15 MED ORDER — SODIUM CHLORIDE 0.9 % IV SOLN
10.0000 mL/h | Freq: Once | INTRAVENOUS | Status: AC
  Administered 2016-04-17: 11:00:00 via INTRAVENOUS

## 2016-04-15 NOTE — ED Notes (Signed)
Pt states black stools x 2 weeks with the dizziness and then weakness today. Hx of same.

## 2016-04-15 NOTE — ED Triage Notes (Signed)
Pt presents to ED via [facility] from home for weakness. Dialysis pt, fistula on R arm. Pink band placed on R arm. Pt states dialysis T, TH, Sat. Went yesterday and had full treatment. States had blood drawn and told hemoglobin 6.5. States he has had low hemoglobin in the past- dizziness, weakness- and feels same right now. States "I can barely walk." States L leg swelling and pain there. EMS stated that pt was scheduled a blood transfusion today but pt states he was told by doctor to come to ED for blood transfusion.

## 2016-04-15 NOTE — ED Provider Notes (Signed)
[facility] Emergency Department Provider Note    First MD Initiated Contact with Patient 04/15/16 2258     (approximate)  I have reviewed the triage vital signs and the nursing notes.   HISTORY  Chief Complaint Weakness    HPI Stanley [name] is a 56 y.o. male history of alcohol abuse as well as end-stage renal disease on dialysis Tuesday Thursday Saturday presents with generalized weakness has been worsening over the past 2 weeks. Patient states is also been having dark melanotic stools. Has had history of the same requiring EGD with cauterization of the bleeding ulcer. Patient states he is present when he had a history of cirrhosis but at [facility] "told me I don't have cirrhosis".   Patient was post a have a routine transfusion today but did not show the clinic. Went to the grocery store and had severe dyspnea and weakness therefore he came to the ER. Not on any blood thinners.   Past Medical History:  Diagnosis Date  . Alcohol abuse   . Cirrhosis ([place])   . Coronary artery disease 2009  . Diabetic peripheral neuropathy ([place])   . Drug abuse   . End stage renal disease on dialysis [facility]) NEPHROLOGIST-   DR [facility]  IN [place]   HEMODIALYSIS --   TUES/  THURS/  SAT  . GERD (gastroesophageal reflux disease)   . Hyperlipidemia   . Hypertension   . PAD (peripheral artery disease) ([place])   . Suicidal ideation    & HOMICIDAL IDEATION --  06-16-2013   ADMITTED TO BEHAVIOR HEALTH   Family History  Problem Relation Age of Onset  . Colon cancer Mother   . Cancer Father   . Cancer Sister    Past Surgical History:  Procedure Laterality Date  . CORONARY ANGIOPLASTY  ?   PT UNABLE TO TELL IF  BEFORE OR AFTER  CABG  . CORONARY ARTERY BYPASS GRAFT  2008  ([place] , [place])   3 VESSEL  . DIALYSIS FISTULA CREATION  LAST SURGERY  APPOX  2008  . ESOPHAGOGASTRODUODENOSCOPY N/A 05/07/2015   Procedure: ESOPHAGOGASTRODUODENOSCOPY (EGD);  Surgeon: Hulen Luster, MD;   Location: [facility] ENDOSCOPY;  Service: Endoscopy;  Laterality: N/A;  . ESOPHAGOGASTRODUODENOSCOPY (EGD) WITH PROPOFOL N/A 05/17/2015   Procedure: ESOPHAGOGASTRODUODENOSCOPY (EGD) WITH PROPOFOL;  Surgeon: [name], MD;  Location: [facility] ENDOSCOPY;  Service: Endoscopy;  Laterality: N/A;  . ESOPHAGOGASTRODUODENOSCOPY (EGD) WITH PROPOFOL N/A 01/20/2016   Procedure: ESOPHAGOGASTRODUODENOSCOPY (EGD) WITH PROPOFOL;  Surgeon: [name], MD;  Location: [facility] ENDOSCOPY;  Service: Endoscopy;  Laterality: N/A;  . MANDIBULAR HARDWARE REMOVAL N/A 07/29/2013   Procedure: REMOVAL OF ARCH BARS;  Surgeon: Theodoro Kos, DO;  Location: [place];  Service: Plastics;  Laterality: N/A;  . ORIF MANDIBULAR FRACTURE N/A 06/05/2013   Procedure: REPAIR OF MANDIBULAR FRACTURE x 2 with maxillo-mandibular fixation ;  Surgeon: Theodoro Kos, DO;  Location: [place];  Service: Plastics;  Laterality: N/A;  . PERIPHERAL ARTERIAL STENT GRAFT Left    Patient Active Problem List   Diagnosis Date Noted  . Upper GI bleed 01/19/2016  . Blood in stool   . Angiodysplasia of stomach and duodenum with hemorrhage   . Gastritis   . Esophagitis, unspecified   . GI bleed 05/16/2015  . Acute GI bleeding   . Symptomatic anemia 04/30/2015  . HTN (hypertension) 04/06/2015  . GERD (gastroesophageal reflux disease) 04/06/2015  . HLD (hyperlipidemia) 04/06/2015  . Dyspnea 04/06/2015  . Cirrhosis ([place]) 04/06/2015  .  Ascites 04/06/2015  . GIB (gastrointestinal bleeding) 03/23/2015  . Homicidal ideation 06/19/2013  . Suicidal intent 06/19/2013  . Homicidal ideations 06/19/2013  . Hyperkalemia 06/16/2013  . Mandible fracture ([place]) 06/05/2013  . Fracture, mandible ([place]) 06/02/2013  . Coronary atherosclerosis of native coronary artery 06/02/2013  . ESRD on dialysis ([place]) 06/02/2013  . Mandible open fracture ([place]) 06/02/2013      Prior to Admission medications   Medication Sig Start Date End Date Taking? Authorizing Provider    amLODipine (NORVASC) 5 MG tablet Take 5 mg by mouth daily.    Historical Provider, MD  aspirin EC 81 MG tablet Take 81 mg by mouth at bedtime.     Historical Provider, MD  b complex-vitamin c-folic acid (NEPHRO-VITE) 0.8 MG TABS tablet Take 1 tablet by mouth daily.    Historical Provider, MD  calcium acetate (PHOSLO) 667 MG capsule Take 2,001 mg by mouth 3 (three) times daily with meals.    Historical Provider, MD  cinacalcet (SENSIPAR) 30 MG tablet Take 30 mg by mouth daily with supper.    Historical Provider, MD  gabapentin (NEURONTIN) 300 MG capsule Take 1 capsule (300 mg total) by mouth daily. Patient taking differently: Take 300 mg by mouth at bedtime.  03/27/15   Bettey Costa, MD  hydrOXYzine (VISTARIL) 25 MG capsule Take 1 capsule (25 mg total) by mouth 3 (three) times daily as needed for itching. 02/25/16   Jami L Hagler, PA-C  labetalol (NORMODYNE) 100 MG tablet Take 1 tablet (100 mg total) by mouth daily. 01/22/16   Epifanio Lesches, MD  nitroGLYCERIN (NITROSTAT) 0.4 MG SL tablet Place 1 tablet (0.4 mg total) under the tongue every 5 (five) minutes as needed. 04/12/16   Wende Bushy, MD  pantoprazole (PROTONIX) 40 MG tablet Take 1 tablet (40 mg total) by mouth 2 (two) times daily. Patient taking differently: Take 40 mg by mouth daily.  01/22/16   Epifanio Lesches, MD    Allergies Patient has no known allergies.    Social History Social History  Substance Use Topics  . Smoking status: Current Every Day Smoker    Packs/day: 0.15    Years: 40.00    Types: Cigarettes  . Smokeless tobacco: Never Used  . Alcohol use No     Comment: pt reports quitting after learning about cirrhosis    Review of Systems Patient denies headaches, rhinorrhea, blurry vision, numbness, shortness of breath, chest pain, edema, cough, abdominal pain, nausea, vomiting, diarrhea, dysuria, fevers, rashes or hallucinations unless otherwise stated above in  HPI. ____________________________________________   PHYSICAL EXAM:  VITAL SIGNS: Vitals:   04/15/16 2254  BP: 121/66  Pulse: 93  Resp: (!) 9  Temp: 98.3 F (36.8 C)    Constitutional: Alert and oriented.  Weak appearing but in no acute distress. Eyes: Conjunctivae are normal. PERRL. EOMI. Head: Atraumatic. Nose: No congestion/rhinnorhea. Mouth/Throat: Mucous membranes are moist.  Oropharynx non-erythematous. Neck: No stridor. Painless ROM. No cervical spine tenderness to palpation Hematological/Lymphatic/Immunilogical: No cervical lymphadenopathy. Cardiovascular: Normal rate, regular rhythm. Grossly normal heart sounds.  Good peripheral circulation. Respiratory: Normal respiratory effort.  No retractions. Lungs CTAB. Gastrointestinal: Soft and nontender. No distention. No abdominal bruits. No CVA tenderness. Genitourinary: guaiac positive melenotic stools Musculoskeletal: No lower extremity tenderness nor edema.  No joint effusions. Neurologic:  Normal speech and language. No gross focal neurologic deficits are appreciated. No gait instability. Skin:  Skin is warm, dry and intact. No rash noted. Psychiatric: Mood and affect are normal. Speech and behavior are  normal.  ____________________________________________   LABS (all labs ordered are listed, but only abnormal results are displayed)  Results for orders placed or performed during the hospital encounter of 04/15/16 (from the past 24 hour(s))  Basic metabolic panel     Status: Abnormal   Collection Time: 04/15/16 11:00 PM  Result Value Ref Range   Sodium 139 135 - 145 mmol/L   Potassium 3.6 3.5 - 5.1 mmol/L   Chloride 96 (L) 101 - 111 mmol/L   CO2 30 22 - 32 mmol/L   Glucose, Bld 112 (H) 65 - 99 mg/dL   BUN 37 (H) 6 - 20 mg/dL   Creatinine, Ser 8.75 (H) 0.61 - 1.24 mg/dL   Calcium 8.9 8.9 - 10.3 mg/dL   GFR calc non Af Amer 6 (L) >60 mL/min   GFR calc Af Amer 7 (L) >60 mL/min   Anion gap 13 5 - 15  CBC      Status: Abnormal   Collection Time: 04/15/16 11:00 PM  Result Value Ref Range   WBC 7.1 3.8 - 10.6 K/uL   RBC 2.10 (L) 4.40 - 5.90 MIL/uL   Hemoglobin 6.2 (L) 13.0 - 18.0 g/dL   HCT 20.0 (L) 40.0 - 52.0 %   MCV 95.2 80.0 - 100.0 fL   MCH 29.6 26.0 - 34.0 pg   MCHC 31.1 (L) 32.0 - 36.0 g/dL   RDW 18.0 (H) 11.5 - 14.5 %   Platelets 269 150 - 440 K/uL  Type and screen [place]     Status: None (Preliminary result)   Collection Time: 04/15/16 11:00 PM  Result Value Ref Range   ABO/RH(D) PENDING    Antibody Screen PENDING    Sample Expiration 04/18/2016    ____________________________________________  EKG____________________________________________  RADIOLOGY   ____________________________________________   PROCEDURES  Procedure(s) performed:  Procedures    Critical Care performed: yes CRITICAL CARE Performed by: Merlyn Lot   Total critical care time: 45 minutes  Critical care time was exclusive of separately billable procedures and treating other patients.  Critical care was necessary to treat or prevent imminent or life-threatening deterioration.  Critical care was time spent personally by me on the following activities: development of treatment plan with patient and/or surrogate as well as nursing, discussions with consultants, evaluation of patient's response to treatment, examination of patient, obtaining history from patient or surrogate, ordering and performing treatments and interventions, ordering and review of laboratory studies, ordering and review of radiographic studies, pulse oximetry and re-evaluation of patient's condition.  ____________________________________________   INITIAL IMPRESSION / ASSESSMENT AND PLAN / ED COURSE  Pertinent labs & imaging results that were available during my care of the patient were reviewed by me and considered in my medical decision making (see chart for details).  DDX: gi bleed, abla, ida,  renal insufficiency  Stanley [name] is a 56 y.o. who presents to the ED with Generalized weakness. Patient afebrile and hemodynamic stable. Presentation, located by history of end-stage renal disease and reported history of cirrhosis of the patient denies this. We'll continue to further evaluate. Patient does appear pale and weak. Work is suggestive of acute anemia. Patient does also have a history of gastritis requiring EGD repair. Could be secondary to renal insufficiency in the setting of an stage renal disease as the patient does get transfusions as an outpatient.  The patient will be placed on continuous pulse oximetry and telemetry for monitoring.  Laboratory evaluation will be sent to evaluate for the above  complaints.     Clinical Course as of Apr 16 28  Sat Apr 16, 2016  0007 Rectal exam is grossly guaiac positive. Do suspect that this patient is a service of some component of ongoing blood loss in the setting of history of gastritis and reflux as well as reported cirrhosis. Patient states that he was told that he is not a cirrhotic but I cannot find any documentation to support this and based on his previous history of compelling to treat as such. He is otherwise hemodynamically stable at this time.  [PR]  0029 As the patient does have evidence of acute blood loss anemia in the setting of his chronic comorbidities and high risk for GI bleed with possible underlying cirrhosis or do feel the patient will require admission for transfusion and further hemodynamic monitoring.  Have discussed with the patient and available family all diagnostics and treatments performed thus far and all questions were answered to the best of my ability. The patient demonstrates understanding and agreement with plan.   [PR]    Clinical Course User Index [PR] Merlyn Lot, MD     ____________________________________________   FINAL CLINICAL IMPRESSION(S) / ED DIAGNOSES  Final diagnoses:   Gastrointestinal hemorrhage, unspecified gastrointestinal hemorrhage type  Acute blood loss anemia  ESRD (end stage renal disease) on dialysis ([place])  Weakness      NEW MEDICATIONS STARTED DURING THIS VISIT:  New Prescriptions   No medications on file     Note:  This document was prepared using Dragon voice recognition software and may include unintentional dictation errors.    Merlyn Lot, MD 04/16/16 0030

## 2016-04-16 ENCOUNTER — Emergency Department: Payer: Medicare Other

## 2016-04-16 ENCOUNTER — Encounter: Payer: Self-pay | Admitting: Internal Medicine

## 2016-04-16 DIAGNOSIS — K31811 Angiodysplasia of stomach and duodenum with bleeding: Secondary | ICD-10-CM | POA: Diagnosis present

## 2016-04-16 DIAGNOSIS — R748 Abnormal levels of other serum enzymes: Secondary | ICD-10-CM | POA: Diagnosis not present

## 2016-04-16 DIAGNOSIS — E1140 Type 2 diabetes mellitus with diabetic neuropathy, unspecified: Secondary | ICD-10-CM | POA: Diagnosis not present

## 2016-04-16 DIAGNOSIS — E1142 Type 2 diabetes mellitus with diabetic polyneuropathy: Secondary | ICD-10-CM | POA: Diagnosis present

## 2016-04-16 DIAGNOSIS — N186 End stage renal disease: Secondary | ICD-10-CM | POA: Diagnosis present

## 2016-04-16 DIAGNOSIS — Z951 Presence of aortocoronary bypass graft: Secondary | ICD-10-CM | POA: Diagnosis not present

## 2016-04-16 DIAGNOSIS — E1122 Type 2 diabetes mellitus with diabetic chronic kidney disease: Secondary | ICD-10-CM | POA: Diagnosis present

## 2016-04-16 DIAGNOSIS — I248 Other forms of acute ischemic heart disease: Secondary | ICD-10-CM | POA: Diagnosis present

## 2016-04-16 DIAGNOSIS — I25810 Atherosclerosis of coronary artery bypass graft(s) without angina pectoris: Secondary | ICD-10-CM | POA: Diagnosis not present

## 2016-04-16 DIAGNOSIS — N2581 Secondary hyperparathyroidism of renal origin: Secondary | ICD-10-CM | POA: Diagnosis present

## 2016-04-16 DIAGNOSIS — I132 Hypertensive heart and chronic kidney disease with heart failure and with stage 5 chronic kidney disease, or end stage renal disease: Secondary | ICD-10-CM | POA: Diagnosis present

## 2016-04-16 DIAGNOSIS — Z7982 Long term (current) use of aspirin: Secondary | ICD-10-CM | POA: Diagnosis not present

## 2016-04-16 DIAGNOSIS — M7989 Other specified soft tissue disorders: Secondary | ICD-10-CM | POA: Diagnosis not present

## 2016-04-16 DIAGNOSIS — F17210 Nicotine dependence, cigarettes, uncomplicated: Secondary | ICD-10-CM | POA: Diagnosis present

## 2016-04-16 DIAGNOSIS — E785 Hyperlipidemia, unspecified: Secondary | ICD-10-CM | POA: Diagnosis present

## 2016-04-16 DIAGNOSIS — K219 Gastro-esophageal reflux disease without esophagitis: Secondary | ICD-10-CM | POA: Diagnosis present

## 2016-04-16 DIAGNOSIS — I252 Old myocardial infarction: Secondary | ICD-10-CM | POA: Diagnosis not present

## 2016-04-16 DIAGNOSIS — Z79899 Other long term (current) drug therapy: Secondary | ICD-10-CM | POA: Diagnosis not present

## 2016-04-16 DIAGNOSIS — K921 Melena: Secondary | ICD-10-CM | POA: Diagnosis not present

## 2016-04-16 DIAGNOSIS — D649 Anemia, unspecified: Secondary | ICD-10-CM | POA: Diagnosis not present

## 2016-04-16 DIAGNOSIS — E1151 Type 2 diabetes mellitus with diabetic peripheral angiopathy without gangrene: Secondary | ICD-10-CM | POA: Diagnosis present

## 2016-04-16 DIAGNOSIS — D631 Anemia in chronic kidney disease: Secondary | ICD-10-CM | POA: Diagnosis present

## 2016-04-16 DIAGNOSIS — D62 Acute posthemorrhagic anemia: Secondary | ICD-10-CM | POA: Diagnosis present

## 2016-04-16 DIAGNOSIS — Z992 Dependence on renal dialysis: Secondary | ICD-10-CM | POA: Diagnosis not present

## 2016-04-16 DIAGNOSIS — I2510 Atherosclerotic heart disease of native coronary artery without angina pectoris: Secondary | ICD-10-CM | POA: Diagnosis present

## 2016-04-16 DIAGNOSIS — R531 Weakness: Secondary | ICD-10-CM | POA: Diagnosis not present

## 2016-04-16 DIAGNOSIS — K922 Gastrointestinal hemorrhage, unspecified: Secondary | ICD-10-CM | POA: Diagnosis not present

## 2016-04-16 LAB — HEMOGLOBIN AND HEMATOCRIT, BLOOD
HCT: 25.4 % — ABNORMAL LOW (ref 40.0–52.0)
HEMATOCRIT: 20.2 % — AB (ref 40.0–52.0)
HEMOGLOBIN: 8.1 g/dL — AB (ref 13.0–18.0)
Hemoglobin: 6.4 g/dL — ABNORMAL LOW (ref 13.0–18.0)

## 2016-04-16 LAB — PROTIME-INR
INR: 1.3
Prothrombin Time: 16.3 seconds — ABNORMAL HIGH (ref 11.4–15.2)

## 2016-04-16 LAB — TROPONIN I: TROPONIN I: 0.1 ng/mL — AB (ref <0.03)

## 2016-04-16 LAB — BASIC METABOLIC PANEL
ANION GAP: 11 (ref 5–15)
BUN: 43 mg/dL — ABNORMAL HIGH (ref 6–20)
CO2: 27 mmol/L (ref 22–32)
CREATININE: 9.8 mg/dL — AB (ref 0.61–1.24)
Calcium: 8.5 mg/dL — ABNORMAL LOW (ref 8.9–10.3)
Chloride: 97 mmol/L — ABNORMAL LOW (ref 101–111)
GFR calc non Af Amer: 5 mL/min — ABNORMAL LOW (ref >60)
GFR, EST AFRICAN AMERICAN: 6 mL/min — AB (ref >60)
Glucose, Bld: 173 mg/dL — ABNORMAL HIGH (ref 65–99)
Potassium: 4 mmol/L (ref 3.5–5.1)
SODIUM: 135 mmol/L (ref 135–145)

## 2016-04-16 LAB — PREPARE RBC (CROSSMATCH)

## 2016-04-16 LAB — PHOSPHORUS: Phosphorus: 6.4 mg/dL — ABNORMAL HIGH (ref 2.5–4.6)

## 2016-04-16 LAB — MRSA PCR SCREENING: MRSA BY PCR: NEGATIVE

## 2016-04-16 MED ORDER — HYDROCOD POLST-CPM POLST ER 10-8 MG/5ML PO SUER
5.0000 mL | Freq: Two times a day (BID) | ORAL | Status: DC
  Administered 2016-04-16 – 2016-04-17 (×3): 5 mL via ORAL
  Filled 2016-04-16 (×3): qty 5

## 2016-04-16 MED ORDER — ONDANSETRON HCL 4 MG/2ML IJ SOLN: 4.0000 mg | Freq: Four times a day (QID) | INTRAMUSCULAR | Status: DC | PRN

## 2016-04-16 MED ORDER — CINACALCET HCL 30 MG PO TABS
30.0000 mg | ORAL_TABLET | Freq: Every day | ORAL | Status: DC
  Filled 2016-04-16: qty 1

## 2016-04-16 MED ORDER — SODIUM CHLORIDE 0.9 % IV SOLN
50.0000 ug/h | INTRAVENOUS | Status: DC
  Administered 2016-04-16 (×2): 50 ug/h via INTRAVENOUS
  Filled 2016-04-16 (×3): qty 1

## 2016-04-16 MED ORDER — CALCIUM ACETATE (PHOS BINDER) 667 MG PO CAPS
2001.0000 mg | ORAL_CAPSULE | Freq: Three times a day (TID) | ORAL | Status: DC
  Administered 2016-04-16 – 2016-04-17 (×2): 2001 mg via ORAL
  Filled 2016-04-16 (×2): qty 3

## 2016-04-16 MED ORDER — GABAPENTIN 300 MG PO CAPS
300.0000 mg | ORAL_CAPSULE | Freq: Every day | ORAL | Status: DC
  Administered 2016-04-16 – 2016-04-17 (×2): 300 mg via ORAL
  Filled 2016-04-16 (×2): qty 1

## 2016-04-16 MED ORDER — PANTOPRAZOLE SODIUM 40 MG IV SOLR
40.0000 mg | Freq: Two times a day (BID) | INTRAVENOUS | Status: DC
  Administered 2016-04-16 (×3): 40 mg via INTRAVENOUS
  Filled 2016-04-16 (×3): qty 40

## 2016-04-16 MED ORDER — ONDANSETRON HCL 4 MG PO TABS: 4.0000 mg | ORAL_TABLET | Freq: Four times a day (QID) | ORAL | Status: DC | PRN

## 2016-04-16 MED ORDER — GI COCKTAIL ~~LOC~~
30.0000 mL | Freq: Once | ORAL | Status: AC
  Administered 2016-04-16: 30 mL via ORAL

## 2016-04-16 MED ORDER — SODIUM CHLORIDE 0.9 % IV SOLN: Freq: Once | INTRAVENOUS | Status: DC

## 2016-04-16 MED ORDER — ACETAMINOPHEN 650 MG RE SUPP: 650.0000 mg | Freq: Four times a day (QID) | RECTAL | Status: DC | PRN

## 2016-04-16 MED ORDER — SODIUM CHLORIDE 0.9% FLUSH
3.0000 mL | Freq: Two times a day (BID) | INTRAVENOUS | Status: DC
  Administered 2016-04-16 (×2): 3 mL via INTRAVENOUS

## 2016-04-16 MED ORDER — SODIUM CHLORIDE 0.9 % IV SOLN
INTRAVENOUS | Status: DC
  Administered 2016-04-16 – 2016-04-17 (×2): via INTRAVENOUS

## 2016-04-16 MED ORDER — SODIUM CHLORIDE 0.9 % IV SOLN
INTRAVENOUS | Status: DC
  Administered 2016-04-17: 10:00:00 via INTRAVENOUS

## 2016-04-16 MED ORDER — GI COCKTAIL ~~LOC~~
ORAL | Status: AC
  Filled 2016-04-16: qty 30

## 2016-04-16 MED ORDER — HYDROCODONE-ACETAMINOPHEN 5-325 MG PO TABS
1.0000 | ORAL_TABLET | ORAL | Status: DC | PRN
  Administered 2016-04-16 – 2016-04-17 (×3): 1 via ORAL
  Filled 2016-04-16 (×4): qty 1

## 2016-04-16 MED ORDER — ACETAMINOPHEN 325 MG PO TABS
650.0000 mg | ORAL_TABLET | Freq: Four times a day (QID) | ORAL | Status: DC | PRN
  Administered 2016-04-16: 650 mg via ORAL
  Filled 2016-04-16: qty 2

## 2016-04-16 NOTE — Progress Notes (Signed)
Pre hd info 

## 2016-04-16 NOTE — H&P (Signed)
[place] [facility] [place] NAME: Stanley [name]    MR#:  [id number]  DATE OF BIRTH:  1960/11/20  DATE OF ADMISSION:  04/15/2016  PRIMARY CARE PHYSICIAN: Dagoberto Ligas, MD   REQUESTING/REFERRING PHYSICIAN:   CHIEF COMPLAINT:   Chief Complaint  Patient presents with  . Weakness    HISTORY OF PRESENT ILLNESS: Stanley [name]  is a 56 y.o. male with a known history of Coronary artery disease end-stage renal disease on dialysis, type 2 diabetes mellitus, diabetic neuropathy, alcohol abuse in the past presented to the emergency room complaining of for dark stool for the last 2 weeks. Patient is yes.melanotic stool for the last 2 weeks and has weakness. Patient gets dialyzed on Tuesday Thursday and Saturday. He felt dizzy and weak and could not walk because of the weakness. Patient was evaluated in the emergency room hemoglobin was 6.2 and stool guaiac was strongly positive. One unit of packed red blood cells was ordered to be transfused intravenously by the ER physician. Hospitalist service was consulted for further care of the patient. No history of any vomiting of blood or coughing up blood. No complaints of any abdominal pain. No complaints of chest pain, shortness of breath. Patient complained of left leg pain and swelling. He was evaluated with a Doppler ultrasound which showed no DVT.  PAST MEDICAL HISTORY:   Past Medical History:  Diagnosis Date  . Alcohol abuse   . Cirrhosis ([place])   . Coronary artery disease 2009  . Diabetic peripheral neuropathy ([place])   . Drug abuse   . End stage renal disease on dialysis [facility]) NEPHROLOGIST-   DR [facility]  IN [place]   HEMODIALYSIS --   TUES/  THURS/  SAT  . GERD (gastroesophageal reflux disease)   . Hyperlipidemia   . Hypertension   . PAD (peripheral artery disease) ([place])   . Suicidal ideation    & HOMICIDAL IDEATION --  06-16-2013   ADMITTED TO BEHAVIOR HEALTH    PAST SURGICAL HISTORY: Past  Surgical History:  Procedure Laterality Date  . CORONARY ANGIOPLASTY  ?   PT UNABLE TO TELL IF  BEFORE OR AFTER  CABG  . CORONARY ARTERY BYPASS GRAFT  2008  ([place] , [place])   3 VESSEL  . DIALYSIS FISTULA CREATION  LAST SURGERY  APPOX  2008  . ESOPHAGOGASTRODUODENOSCOPY N/A 05/07/2015   Procedure: ESOPHAGOGASTRODUODENOSCOPY (EGD);  Surgeon: Hulen Luster, MD;  Location: [facility] ENDOSCOPY;  Service: Endoscopy;  Laterality: N/A;  . ESOPHAGOGASTRODUODENOSCOPY (EGD) WITH PROPOFOL N/A 05/17/2015   Procedure: ESOPHAGOGASTRODUODENOSCOPY (EGD) WITH PROPOFOL;  Surgeon: [name], MD;  Location: [facility] ENDOSCOPY;  Service: Endoscopy;  Laterality: N/A;  . ESOPHAGOGASTRODUODENOSCOPY (EGD) WITH PROPOFOL N/A 01/20/2016   Procedure: ESOPHAGOGASTRODUODENOSCOPY (EGD) WITH PROPOFOL;  Surgeon: [name], MD;  Location: [facility] ENDOSCOPY;  Service: Endoscopy;  Laterality: N/A;  . MANDIBULAR HARDWARE REMOVAL N/A 07/29/2013   Procedure: REMOVAL OF ARCH BARS;  Surgeon: Theodoro Kos, DO;  Location: [place];  Service: Plastics;  Laterality: N/A;  . ORIF MANDIBULAR FRACTURE N/A 06/05/2013   Procedure: REPAIR OF MANDIBULAR FRACTURE x 2 with maxillo-mandibular fixation ;  Surgeon: Theodoro Kos, DO;  Location: [place];  Service: Plastics;  Laterality: N/A;  . PERIPHERAL ARTERIAL STENT GRAFT Left     SOCIAL HISTORY:  Social History  Substance Use Topics  . Smoking status: Current Every Day Smoker    Packs/day: 0.15    Years: 40.00    Types: Cigarettes  .  Smokeless tobacco: Never Used  . Alcohol use No     Comment: pt reports quitting after learning about cirrhosis    FAMILY HISTORY:  Family History  Problem Relation Age of Onset  . Colon cancer Mother   . Cancer Father   . Cancer Sister     DRUG ALLERGIES: No Known Allergies  REVIEW OF SYSTEMS:   CONSTITUTIONAL: No fever,has weakness.  EYES: No blurred or double vision.  EARS, NOSE, AND THROAT: No tinnitus or ear pain.  RESPIRATORY: No cough,  shortness of breath, wheezing or hemoptysis.  CARDIOVASCULAR: No chest pain, orthopnea, edema.  GASTROINTESTINAL: No nausea, vomiting, diarrhea or abdominal pain.  Has dark melanotic stools. GENITOURINARY: No dysuria, hematuria.  ENDOCRINE: No polyuria, nocturia,  HEMATOLOGY: Has anemia SKIN: No rash or lesion. MUSCULOSKELETAL: No joint pain or arthritis. Left leg pain   NEUROLOGIC: No tingling, numbness, weakness.  PSYCHIATRY: No anxiety or depression.   MEDICATIONS AT HOME:  Prior to Admission medications   Medication Sig Start Date End Date Taking? Authorizing Provider  amLODipine (NORVASC) 5 MG tablet Take 5 mg by mouth daily.   Yes Historical Provider, MD  aspirin EC 81 MG tablet Take 81 mg by mouth at bedtime.    Yes Historical Provider, MD  b complex-vitamin c-folic acid (NEPHRO-VITE) 0.8 MG TABS tablet Take 1 tablet by mouth daily.   Yes Historical Provider, MD  calcium acetate (PHOSLO) 667 MG capsule Take 2,001 mg by mouth 3 (three) times daily with meals.   Yes Historical Provider, MD  cinacalcet (SENSIPAR) 30 MG tablet Take 30 mg by mouth daily with supper.   Yes Historical Provider, MD  furosemide (LASIX) 80 MG tablet Take 1 tablet by mouth daily.   Yes Historical Provider, MD  gabapentin (NEURONTIN) 300 MG capsule Take 1 capsule (300 mg total) by mouth daily. Patient taking differently: Take 300 mg by mouth at bedtime.  03/27/15  Yes Bettey Costa, MD  hydrOXYzine (VISTARIL) 25 MG capsule Take 1 capsule (25 mg total) by mouth 3 (three) times daily as needed for itching. 02/25/16  Yes Jami L Hagler, PA-C  labetalol (NORMODYNE) 100 MG tablet Take 1 tablet (100 mg total) by mouth daily. 01/22/16  Yes Epifanio Lesches, MD  nitroGLYCERIN (NITROSTAT) 0.4 MG SL tablet Place 1 tablet (0.4 mg total) under the tongue every 5 (five) minutes as needed. 04/12/16  Yes Wende Bushy, MD  pantoprazole (PROTONIX) 40 MG tablet Take 1 tablet (40 mg total) by mouth 2 (two) times daily. Patient  taking differently: Take 40 mg by mouth daily.  01/22/16  Yes Epifanio Lesches, MD      PHYSICAL EXAMINATION:   VITAL SIGNS: Blood pressure 132/75, pulse 92, temperature 98.8 F (37.1 C), temperature source Oral, resp. rate (!) 21, height 6\' 3"  (1.905 m), weight 83.9 kg (185 lb), SpO2 93 %.  GENERAL:  56 y.o.-year-old patient lying in the bed with no acute distress.  EYES: Pupils equal, round, reactive to light and accommodation. No scleral icterus. Extraocular muscles intact.  HEENT: Head atraumatic, normocephalic. Oropharynx and nasopharynx clear.  NECK:  Supple, no jugular venous distention. No thyroid enlargement, no tenderness.  LUNGS: Normal breath sounds bilaterally, no wheezing, rales,rhonchi or crepitation. No use of accessory muscles of respiration.  CARDIOVASCULAR: S1, S2 normal. No murmurs, rubs, or gallops.  ABDOMEN: Soft, nontender, nondistended. Bowel sounds present. No organomegaly or mass.  EXTREMITIES: No pedal edema, cyanosis, or clubbing.  NEUROLOGIC: Cranial nerves II through XII are intact. Muscle strength 5/5 in  all extremities. Sensation intact. Gait not checked.  PSYCHIATRIC: The patient is alert and oriented x 3.  SKIN: No obvious rash, lesion, or ulcer.   LABORATORY PANEL:   CBC  Recent Labs Lab 04/15/16 2300  WBC 7.1  HGB 6.2*  HCT 20.0*  PLT 269  MCV 95.2  MCH 29.6  MCHC 31.1*  RDW 18.0*   ------------------------------------------------------------------------------------------------------------------  Chemistries   Recent Labs Lab 04/15/16 2300  NA 139  K 3.6  CL 96*  CO2 30  GLUCOSE 112*  BUN 37*  CREATININE 8.75*  CALCIUM 8.9   ------------------------------------------------------------------------------------------------------------------ estimated creatinine clearance is 11.2 mL/min (by C-G formula based on SCr of 8.75 mg/dL  (H)). ------------------------------------------------------------------------------------------------------------------ No results for input(s): TSH, T4TOTAL, T3FREE, THYROIDAB in the last 72 hours.  Invalid input(s): FREET3   Coagulation profile  Recent Labs Lab 04/16/16 0122  INR 1.30   ------------------------------------------------------------------------------------------------------------------- No results for input(s): DDIMER in the last 72 hours. -------------------------------------------------------------------------------------------------------------------  Cardiac Enzymes  Recent Labs Lab 04/12/16 1201  TROPONINI 0.09*   ------------------------------------------------------------------------------------------------------------------ Invalid input(s): POCBNP  ---------------------------------------------------------------------------------------------------------------  Urinalysis    Component Value Date/Time   COLORURINE YELLOW (A) 05/03/2015 0815   APPEARANCEUR CLEAR (A) 05/03/2015 0815   APPEARANCEUR Clear 11/13/2013 1314   LABSPEC 1.008 05/03/2015 0815   LABSPEC 1.014 11/13/2013 1314   PHURINE 9.0 (H) 05/03/2015 0815   GLUCOSEU NEGATIVE 05/03/2015 0815   GLUCOSEU 50 mg/dL 11/13/2013 1314   HGBUR NEGATIVE 05/03/2015 0815   BILIRUBINUR NEGATIVE 05/03/2015 0815   BILIRUBINUR Negative 11/13/2013 1314   KETONESUR NEGATIVE 05/03/2015 0815   PROTEINUR 100 (A) 05/03/2015 0815   UROBILINOGEN 0.2 06/22/2014 1230   NITRITE NEGATIVE 05/03/2015 0815   LEUKOCYTESUR NEGATIVE 05/03/2015 0815   LEUKOCYTESUR Negative 11/13/2013 1314     RADIOLOGY: US Venous Img Lower Unilateral Left  Result Date: 04/16/2016 CLINICAL DATA:  Left leg pain and swelling for 1 day. Concern for DVT. EXAM: LEFT LOWER EXTREMITY VENOUS DOPPLER ULTRASOUND TECHNIQUE: Gray-scale sonography with graded compression, as well as color Doppler and duplex ultrasound were performed to evaluate  the lower extremity deep venous systems from the level of the common femoral vein and including the common femoral, femoral, profunda femoral, popliteal and calf veins including the posterior tibial, peroneal and gastrocnemius veins when visible. The superficial great saphenous vein was also interrogated. Spectral Doppler was utilized to evaluate flow at rest and with distal augmentation maneuvers in the common femoral, femoral and popliteal veins. COMPARISON:  Lower extremity duplex 06/18/2011 FINDINGS: Contralateral Common Femoral Vein: Respiratory phasicity is normal and symmetric with the symptomatic side. No evidence of thrombus. Normal compressibility. Common Femoral Vein: No evidence of thrombus. Normal compressibility, respiratory phasicity and response to augmentation. Saphenofemoral Junction: No evidence of thrombus. Normal compressibility and flow on color Doppler imaging. Profunda Femoral Vein: No evidence of thrombus. Normal compressibility and flow on color Doppler imaging. Femoral Vein: No evidence of thrombus. Normal compressibility, respiratory phasicity and response to augmentation. Popliteal Vein: No evidence of thrombus. Normal compressibility, respiratory phasicity and response to augmentation. Calf Veins: No evidence of thrombus. Normal compressibility and flow on color Doppler imaging. Superficial Great Saphenous Vein: No evidence of thrombus. Normal compressibility and flow on color Doppler imaging. Venous Reflux:  None. Other Findings: Subcutaneous edema noted about the popliteal area and calf. IMPRESSION: No evidence of left lower extremity deep venous thrombosis. Soft tissue edema of the calf. Electronically Signed   By: Jeb Levering M.D.   On: 04/16/2016 01:16    EKG: Orders placed or  performed during the hospital encounter of 04/15/16  . ED EKG  . ED EKG  . EKG 12-Lead  . EKG 12-Lead    IMPRESSION AND PLAN: 56 year old male patient with history of for alcohol use,  end-stage renal disease on dialysis, coronary artery disease, diabetic neuropathy, cirrhosis of liver presented to the emergency room with dark melanotic stool. Admitting diagnosis 1. Acute gastrointestinal bleeding 2. Symptomatic anemia 3. End-stage renal disease on dialysis 4. Fatigue and weakness secondary to anemia 5. Diabetic neuropathy Treatment plan Admit patient to medical floor Keep patient nothing by mouth Transfuse 1 unit packed red blood cells intravenously Nephrology consultation for dialysis Gastroenterology consultation for colonoscopy Serial hemoglobin and hematocrit monitoring Hold blood thinner medication DVT prophylaxis with sequential compression device to lower extremities All the records are reviewed and case discussed with ED provider. Management plans discussed with the patient, family and they are in agreement.  CODE STATUS:FULL CODE Code Status History    Date Active Date Inactive Code Status Order ID Comments User Context   01/19/2016  3:13 PM 01/22/2016  2:14 PM Full Code [id number]  Loletha Grayer, MD ED   09/24/2015 10:33 AM 09/25/2015  1:31 PM Full Code [id number]  Loletha Grayer, MD ED   05/16/2015  2:17 PM 05/17/2015  8:57 PM Full Code [id number]  Idelle Crouch, MD Inpatient   04/30/2015  8:08 PM 05/08/2015  7:40 PM Full Code [id number]  Vaughan Basta, MD Inpatient   04/06/2015  5:07 AM 04/07/2015  5:36 PM Full Code [id number]  Lance Coon, MD Inpatient   03/23/2015 11:07 AM 03/27/2015 12:46 PM Full Code [id number]  Bettey Costa, MD Inpatient   06/19/2013 11:32 AM 06/21/2013  5:49 PM Full Code [id number]  Cristal Ford, DO Inpatient   06/16/2013  5:26 PM 06/19/2013 11:32 AM Full Code [id number]  Margarita Mail, PA-C ED   06/05/2013  3:33 PM 06/06/2013  6:56 PM Full Code [id number]  [place], DO Inpatient   06/02/2013  9:01 AM 06/05/2013  3:33 PM Full Code [id number]  Velvet Bathe, MD Inpatient   06/02/2013  8:40 AM 06/02/2013  9:01 AM Full Code [id number]  Velvet Bathe,  MD Inpatient       TOTAL TIME TAKING CARE OF THIS PATIENT: 53 minutes.    Saundra Shelling M.D on 04/16/2016 at 3:15 AM  Between 7am to 6pm - Pager - [phone]  After 6pm go to [email or web address] - password [email or web address] [facility]  Office  [phone]  CC: Primary care physician; Dagoberto Ligas, MD

## 2016-04-16 NOTE — Progress Notes (Signed)
Pts troponin results at 0.10. MD notified. No new orders I will continue to assess.

## 2016-04-16 NOTE — Progress Notes (Signed)
   Start of 1st uprbc

## 2016-04-16 NOTE — Progress Notes (Signed)
Post hd assessment 

## 2016-04-16 NOTE — Progress Notes (Signed)
Pre hd assessment  

## 2016-04-16 NOTE — Progress Notes (Signed)
Hd start 

## 2016-04-16 NOTE — ED Notes (Signed)
Pt asked to provide urine and told this RN that he makes very little urine due to dialysis and cannot give a sample at this time.

## 2016-04-16 NOTE — Progress Notes (Signed)
[place] at [place] NAME: Stanley [name]    MR#:  [id number]  DATE OF BIRTH:  21-Mar-1960  SUBJECTIVE:  CHIEF COMPLAINT:   Chief Complaint  Patient presents with  . Weakness   The patient is 56 year old male with medical history significant for history of coronary artery disease, end-stage renal disease, on hemodialysis, diabetes mellitus, diabetic neuropathy, alcohol abuse in the past, denied any recent alcohol use, who presents to the hospital with complaints of dark stools for the past 2 weeks, dizziness, weakness. On arrival to emergency room, his hemoglobin level was found to be 6.2, stool guaiac was strongly positive. Patient was transfused 1 unit of packed blood cells after transfusion hemoglobin level improved to 6.4. Patient feels comfortable, denies any recurrent bleeding.   Review of Systems  Constitutional: Negative for chills, fever and weight loss.  HENT: Negative for congestion.   Eyes: Negative for blurred vision and double vision.  Respiratory: Negative for cough, sputum production, shortness of breath and wheezing.   Cardiovascular: Negative for chest pain, palpitations, orthopnea, leg swelling and PND.  Gastrointestinal: Positive for melena. Negative for abdominal pain, blood in stool, constipation, diarrhea, nausea and vomiting.  Genitourinary: Negative for dysuria, frequency, hematuria and urgency.  Musculoskeletal: Negative for falls.  Neurological: Negative for dizziness, tremors, focal weakness and headaches.  Endo/Heme/Allergies: Does not bruise/bleed easily.  Psychiatric/Behavioral: Negative for depression. The patient does not have insomnia.     VITAL SIGNS: Blood pressure 124/81, pulse 80, temperature 98.2 F (36.8 C), temperature source Oral, resp. rate 12, height 6\' 3"  (1.905 m), weight 82.3 kg (181 lb 7 oz), SpO2 100 %.  PHYSICAL EXAMINATION:   GENERAL:  56 y.o.-year-old patient lying in the bed with  no acute distress. Somnolent, but opens his eyes and converses, appropriately.  EYES: Pupils equal, round, reactive to light and accommodation. No scleral icterus. Extraocular muscles intact.  HEENT: Head atraumatic, normocephalic. Oropharynx and nasopharynx clear.  NECK:  Supple, no jugular venous distention. No thyroid enlargement, no tenderness.  LUNGS: Normal breath sounds bilaterally, no wheezing, rales,rhonchi or crepitation. No use of accessory muscles of respiration.  CARDIOVASCULAR: S1, S2 normal. No murmurs, rubs, or gallops.  ABDOMEN: Soft, nontender, nondistended. Bowel sounds present. No organomegaly or mass.  EXTREMITIES: No pedal edema, cyanosis, or clubbing.  NEUROLOGIC: Cranial nerves II through XII are intact. Muscle strength 5/5 in all extremities. Sensation intact. Gait not checked.  PSYCHIATRIC: The patient is alert and oriented x 3.  SKIN: No obvious rash, lesion, or ulcer.   ORDERS/RESULTS REVIEWED:   CBC  Recent Labs Lab 04/15/16 2300 04/16/16 0646  WBC 7.1  --   HGB 6.2* 6.4*  HCT 20.0* 20.2*  PLT 269  --   MCV 95.2  --   MCH 29.6  --   MCHC 31.1*  --   RDW 18.0*  --    ------------------------------------------------------------------------------------------------------------------  Chemistries   Recent Labs Lab 04/15/16 2300 04/16/16 0646  NA 139 135  K 3.6 4.0  CL 96* 97*  CO2 30 27  GLUCOSE 112* 173*  BUN 37* 43*  CREATININE 8.75* 9.80*  CALCIUM 8.9 8.5*   ------------------------------------------------------------------------------------------------------------------ estimated creatinine clearance is 9.8 mL/min (by C-G formula based on SCr of 9.8 mg/dL (H)). ------------------------------------------------------------------------------------------------------------------ No results for input(s): TSH, T4TOTAL, T3FREE, THYROIDAB in the last 72 hours.  Invalid input(s): FREET3  Cardiac Enzymes  Recent Labs Lab 04/12/16 1201   TROPONINI 0.09*   ------------------------------------------------------------------------------------------------------------------ Invalid input(s): POCBNP ---------------------------------------------------------------------------------------------------------------  RADIOLOGY: US Venous Img Lower Unilateral Left  Result Date: 04/16/2016 CLINICAL DATA:  Left leg pain and swelling for 1 day. Concern for DVT. EXAM: LEFT LOWER EXTREMITY VENOUS DOPPLER ULTRASOUND TECHNIQUE: Gray-scale sonography with graded compression, as well as color Doppler and duplex ultrasound were performed to evaluate the lower extremity deep venous systems from the level of the common femoral vein and including the common femoral, femoral, profunda femoral, popliteal and calf veins including the posterior tibial, peroneal and gastrocnemius veins when visible. The superficial great saphenous vein was also interrogated. Spectral Doppler was utilized to evaluate flow at rest and with distal augmentation maneuvers in the common femoral, femoral and popliteal veins. COMPARISON:  Lower extremity duplex 06/18/2011 FINDINGS: Contralateral Common Femoral Vein: Respiratory phasicity is normal and symmetric with the symptomatic side. No evidence of thrombus. Normal compressibility. Common Femoral Vein: No evidence of thrombus. Normal compressibility, respiratory phasicity and response to augmentation. Saphenofemoral Junction: No evidence of thrombus. Normal compressibility and flow on color Doppler imaging. Profunda Femoral Vein: No evidence of thrombus. Normal compressibility and flow on color Doppler imaging. Femoral Vein: No evidence of thrombus. Normal compressibility, respiratory phasicity and response to augmentation. Popliteal Vein: No evidence of thrombus. Normal compressibility, respiratory phasicity and response to augmentation. Calf Veins: No evidence of thrombus. Normal compressibility and flow on color Doppler imaging. Superficial  Great Saphenous Vein: No evidence of thrombus. Normal compressibility and flow on color Doppler imaging. Venous Reflux:  None. Other Findings: Subcutaneous edema noted about the popliteal area and calf. IMPRESSION: No evidence of left lower extremity deep venous thrombosis. Soft tissue edema of the calf. Electronically Signed   By: Jeb Levering M.D.   On: 04/16/2016 01:16    EKG:  Orders placed or performed during the hospital encounter of 04/15/16  . ED EKG  . ED EKG  . EKG 12-Lead  . EKG 12-Lead    ASSESSMENT AND PLAN:  Active Problems:   * No active hospital problems. * #1. Gastrointestinal bleed with melena, continue patient on PPI, octreotide, gastroenterologist consultation is requested for possible EGD/colonoscopy,  #2. Acute posthemorrhagic anemia, transfuse 2 more units of packed red blood cells, follow hemoglobin level later today, #3. Alcohol abuse, patient denies any recent bleeding, which for withdrawal #4. End-stage renal disease, continue dialysis per nephrologist #5. Elevated troponin, likely demand ischemia, recheck troponin today, no chest pains   Management plans discussed with the patient, family and they are in agreement.   DRUG ALLERGIES: No Known Allergies  CODE STATUS:     Code Status Orders        Start     Ordered   04/16/16 0357  Full code  Continuous     04/16/16 0356    Code Status History    Date Active Date Inactive Code Status Order ID Comments User Context   01/19/2016  3:13 PM 01/22/2016  2:14 PM Full Code [id number]  Loletha Grayer, MD ED   09/24/2015 10:33 AM 09/25/2015  1:31 PM Full Code [id number]  Loletha Grayer, MD ED   05/16/2015  2:17 PM 05/17/2015  8:57 PM Full Code [id number]  Idelle Crouch, MD Inpatient   04/30/2015  8:08 PM 05/08/2015  7:40 PM Full Code [id number]  Vaughan Basta, MD Inpatient   04/06/2015  5:07 AM 04/07/2015  5:36 PM Full Code [id number]  Lance Coon, MD Inpatient   03/23/2015 11:07 AM 03/27/2015 12:46 PM Full  Code [id number]  Bettey Costa, MD Inpatient   06/19/2013 11:32 AM 06/21/2013  5:49  PM Full Code [id number]  Cristal Ford, DO Inpatient   06/16/2013  5:26 PM 06/19/2013 11:32 AM Full Code [id number]  Margarita Mail, PA-C ED   06/05/2013  3:33 PM 06/06/2013  6:56 PM Full Code [id number]  Theodoro Kos, DO Inpatient   06/02/2013  9:01 AM 06/05/2013  3:33 PM Full Code [id number]  Velvet Bathe, MD Inpatient   06/02/2013  8:40 AM 06/02/2013  9:01 AM Full Code [id number]  Velvet Bathe, MD Inpatient      TOTAL TIME TAKING CARE OF THIS PATIENT: 35 minutes.    Theodoro Grist M.D on 04/16/2016 at 2:10 PM  Between 7am to 6pm - Pager - [phone]  After 6pm go to [email or web address] - password [email or web address] [facility]  Office  [phone]  CC: Primary care physician; Dagoberto Ligas, MD

## 2016-04-16 NOTE — Progress Notes (Signed)
2nd uprbc started

## 2016-04-16 NOTE — Progress Notes (Signed)
Post hd vitals 

## 2016-04-16 NOTE — Plan of Care (Signed)
Problem: Pain Managment: Goal: General experience of comfort will improve Outcome: Not Progressing Patient has states minimal relief with pain medication. Patient has chronic left leg pain. Educate to patient about chronic pain.

## 2016-04-16 NOTE — Progress Notes (Signed)
Pt returns from HD, VSS, 0.5 liters removed. Pt is complaining of left foot pain and cough. I will notify MD for orders. I will continue to assess.

## 2016-04-16 NOTE — Plan of Care (Signed)
Problem: Bowel/Gastric: Goal: Will show no signs and symptoms of gastrointestinal bleeding Outcome: Not Progressing Pt requires 2 more units of Prbcs this shift. I will continue to assess for sign and symptoms of bleeding.

## 2016-04-16 NOTE — Consult Note (Signed)
[name], MD [address] [place], [place] [address] Phone: [phone] Fax : [phone]  Consultation  Referring Provider:     No ref. provider found Primary Care Physician:  Dagoberto Ligas, MD Primary Gastroenterologist:  Dr. [name]     Reason for Consultation:    Melena  Date of Admission:  04/15/2016 Date of Consultation:  04/16/2016         HPI:   Stanley [name] is a 56 y.o. male with ESRD on Hd, presents with recurrent melena since 2 weeks and weakness. He reports that he has been having melena intermittently past 14months. Each episode last about 1 month and then he starts having brown bowel movements. His vitals were stable in ER, Hb dropped to 6.2 on admission from 11.4 on 03/20/16. He is receiving 2 u PRBCs during dialysis when I saw him. His last episode was last night. He is NPO, started on octreotide drip, and IV PPI. He denies taking oral iron at home. He denies ETOH. He denies abdominal pain/n/v/hematemeisis/BRBPR/weight loss/LOA.  He had 3 EGDs for melena in 2017, found to have AVMs and gastritis. Negative for H Pylori. He had colonoscopy in 04/2015, showed adenoma, but no AVMs.   He does not have evidence of cirrhosis and he was evaluaed for this at [facility].   Past Medical History:  Diagnosis Date  . Alcohol abuse   . Cirrhosis ([place])   . Coronary artery disease 2009  . Diabetic peripheral neuropathy ([place])   . Drug abuse   . End stage renal disease on dialysis [facility]) NEPHROLOGIST-   DR [facility]  IN [place]   HEMODIALYSIS --   TUES/  THURS/  SAT  . GERD (gastroesophageal reflux disease)   . Hyperlipidemia   . Hypertension   . PAD (peripheral artery disease) ([place])   . Suicidal ideation    & HOMICIDAL IDEATION --  06-16-2013   ADMITTED TO BEHAVIOR HEALTH    Past Surgical History:  Procedure Laterality Date  . CORONARY ANGIOPLASTY  ?   PT UNABLE TO TELL IF  BEFORE OR AFTER  CABG  . CORONARY ARTERY BYPASS GRAFT  2008  ([place] ,  [place])   3 VESSEL  . DIALYSIS FISTULA CREATION  LAST SURGERY  APPOX  2008  . ESOPHAGOGASTRODUODENOSCOPY N/A 05/07/2015   Procedure: ESOPHAGOGASTRODUODENOSCOPY (EGD);  Surgeon: Hulen Luster, MD;  Location[facility] ENDOSCOPY;  Service: Endoscopy;  Laterality: N/A;  . ESOPHAGOGASTRODUODENOSCOPY (EGD) WITH PROPOFOL N/A 05/17/2015   Procedure: ESOPHAGOGASTRODUODENOSCOPY (EGD) WITH PROPOFOL;  Surgeon: [name], MD;  Location: [facility] ENDOSCOPY;  Service: Endoscopy;  Laterality: N/A;  . ESOPHAGOGASTRODUODENOSCOPY (EGD) WITH PROPOFOL N/A 01/20/2016   Procedure: ESOPHAGOGASTRODUODENOSCOPY (EGD) WITH PROPOFOL;  Surgeon: [name], MD;  Location: [facility] ENDOSCOPY;  Service: Endoscopy;  Laterality: N/A;  . MANDIBULAR HARDWARE REMOVAL N/A 07/29/2013   Procedure: REMOVAL OF ARCH BARS;  Surgeon: Theodoro Kos, DO;  Location: [place];  Service: Plastics;  Laterality: N/A;  . ORIF MANDIBULAR FRACTURE N/A 06/05/2013   Procedure: REPAIR OF MANDIBULAR FRACTURE x 2 with maxillo-mandibular fixation ;  Surgeon: Theodoro Kos, DO;  Location: [place];  Service: Plastics;  Laterality: N/A;  . PERIPHERAL ARTERIAL STENT GRAFT Left     Prior to Admission medications   Medication Sig Start Date End Date Taking? Authorizing Provider  amLODipine (NORVASC) 5 MG tablet Take 5 mg by mouth daily.   Yes Historical Provider, MD  aspirin EC 81 MG tablet Take 81 mg by  mouth at bedtime.    Yes Historical Provider, MD  b complex-vitamin c-folic acid (NEPHRO-VITE) 0.8 MG TABS tablet Take 1 tablet by mouth daily.   Yes Historical Provider, MD  calcium acetate (PHOSLO) 667 MG capsule Take 2,001 mg by mouth 3 (three) times daily with meals.   Yes Historical Provider, MD  cinacalcet (SENSIPAR) 30 MG tablet Take 30 mg by mouth daily with supper.   Yes Historical Provider, MD  furosemide (LASIX) 80 MG tablet Take 1 tablet by mouth daily.   Yes Historical Provider, MD  gabapentin (NEURONTIN) 300 MG capsule Take 1 capsule (300 mg total) by  mouth daily. Patient taking differently: Take 300 mg by mouth at bedtime.  03/27/15  Yes Bettey Costa, MD  hydrOXYzine (VISTARIL) 25 MG capsule Take 1 capsule (25 mg total) by mouth 3 (three) times daily as needed for itching. 02/25/16  Yes Jami L Hagler, PA-C  labetalol (NORMODYNE) 100 MG tablet Take 1 tablet (100 mg total) by mouth daily. 01/22/16  Yes Epifanio Lesches, MD  nitroGLYCERIN (NITROSTAT) 0.4 MG SL tablet Place 1 tablet (0.4 mg total) under the tongue every 5 (five) minutes as needed. 04/12/16  Yes Wende Bushy, MD  pantoprazole (PROTONIX) 40 MG tablet Take 1 tablet (40 mg total) by mouth 2 (two) times daily. Patient taking differently: Take 40 mg by mouth daily.  01/22/16  Yes Epifanio Lesches, MD    Family History  Problem Relation Age of Onset  . Colon cancer Mother   . Cancer Father   . Cancer Sister      Social History  Substance Use Topics  . Smoking status: Current Every Day Smoker    Packs/day: 0.15    Years: 40.00    Types: Cigarettes  . Smokeless tobacco: Never Used  . Alcohol use No     Comment: pt reports quitting after learning about cirrhosis    Allergies as of 04/15/2016  . (No Known Allergies)    Review of Systems:    All systems reviewed and negative except where noted in HPI.   Physical Exam:  Vital signs in last 24 hours: Temp:  [98 F (36.7 C)-99.2 F (37.3 C)] 98.2 F (36.8 C) (02/03 1400) Pulse Rate:  [74-94] 80 (02/03 1400) Resp:  [9-24] 12 (02/03 1400) BP: (98-132)/(59-86) 124/81 (02/03 1400) SpO2:  [93 %-100 %] 100 % (02/03 1400) Weight:  [82.3 kg (181 lb 7 oz)-83.9 kg (185 lb)] 82.3 kg (181 lb 7 oz) (02/03 1105) Last BM Date: 04/15/16 General:   Pleasant, cooperative in NAD Head:  Normocephalic and atraumatic. Eyes:   No icterus.   Conjunctiva pink. PERRLA. Ears:  Normal auditory acuity. Neck:  Supple; no masses or thyroidomegaly Lungs: Respirations even and unlabored. Lungs clear to auscultation bilaterally.   No wheezes,  crackles, or rhonchi.  Heart:  Regular rate and rhythm;  Without murmur, clicks, rubs or gallops Abdomen:  Soft, nondistended, nontender. Normal bowel sounds. No appreciable masses or hepatomegaly.  No rebound or guarding.  Rectal:  Not performed. Msk:  Symmetrical without gross deformities.  Strength  Extremities:  Without edema, cyanosis or clubbing. Neurologic:  Alert and oriented x3;  grossly normal neurologically. Skin:  Intact without significant lesions or rashes. Cervical Nodes:  No significant cervical adenopathy. Psych:  Alert and cooperative. Normal affect.  LAB RESULTS:  Recent Labs  04/15/16 2300 04/16/16 0646  WBC 7.1  --   HGB 6.2* 6.4*  HCT 20.0* 20.2*  PLT 269  --    BMET  Recent Labs  04/15/16 2300 04/16/16 0646  NA 139 135  K 3.6 4.0  CL 96* 97*  CO2 30 27  GLUCOSE 112* 173*  BUN 37* 43*  CREATININE 8.75* 9.80*  CALCIUM 8.9 8.5*   LFT No results for input(s): PROT, ALBUMIN, AST, ALT, ALKPHOS, BILITOT, BILIDIR, IBILI in the last 72 hours. PT/INR  Recent Labs  04/16/16 0122  LABPROT 16.3*  INR 1.30    STUDIES: US Venous Img Lower Unilateral Left  Result Date: 04/16/2016 CLINICAL DATA:  Left leg pain and swelling for 1 day. Concern for DVT. EXAM: LEFT LOWER EXTREMITY VENOUS DOPPLER ULTRASOUND TECHNIQUE: Gray-scale sonography with graded compression, as well as color Doppler and duplex ultrasound were performed to evaluate the lower extremity deep venous systems from the level of the common femoral vein and including the common femoral, femoral, profunda femoral, popliteal and calf veins including the posterior tibial, peroneal and gastrocnemius veins when visible. The superficial great saphenous vein was also interrogated. Spectral Doppler was utilized to evaluate flow at rest and with distal augmentation maneuvers in the common femoral, femoral and popliteal veins. COMPARISON:  Lower extremity duplex 06/18/2011 FINDINGS: Contralateral Common Femoral  Vein: Respiratory phasicity is normal and symmetric with the symptomatic side. No evidence of thrombus. Normal compressibility. Common Femoral Vein: No evidence of thrombus. Normal compressibility, respiratory phasicity and response to augmentation. Saphenofemoral Junction: No evidence of thrombus. Normal compressibility and flow on color Doppler imaging. Profunda Femoral Vein: No evidence of thrombus. Normal compressibility and flow on color Doppler imaging. Femoral Vein: No evidence of thrombus. Normal compressibility, respiratory phasicity and response to augmentation. Popliteal Vein: No evidence of thrombus. Normal compressibility, respiratory phasicity and response to augmentation. Calf Veins: No evidence of thrombus. Normal compressibility and flow on color Doppler imaging. Superficial Great Saphenous Vein: No evidence of thrombus. Normal compressibility and flow on color Doppler imaging. Venous Reflux:  None. Other Findings: Subcutaneous edema noted about the popliteal area and calf. IMPRESSION: No evidence of left lower extremity deep venous thrombosis. Soft tissue edema of the calf. Electronically Signed   By: Jeb Levering M.D.   On: 04/16/2016 01:16      Impression / Plan:   Stanley [name] is a 56 y.o. y/o male with ESRD on HD, HTN, CHF, with chronic intermittent melena, known h/o AVMs, admitted with recurrent melena and severe anemia from chronic blood likely secondary to AVMs. Work up thus far revealed AVMs in stomach on several EGDs in 2017. Probably he is bleeding from AVMs again. I discussed with him about push enteroscopy to evaluate for small bowel AVMs and he amenable with the plan. He does not have cirrhosis.  - Continue PPI BID - Can stop octreotide - Advance diet to full liquids only - NPO past midnight - Goal Hb > 8 - Plan for push enteroscopy tomorrow - He will have chronic blood loss from AVMs which have high likelihood of recurrent GI bleed despite endoscopic therapies.   - Recommend frequent monitoring of Hb, transfuse as needed, replace iron periodically. - Other therapies to consider are octreotide [place] injections - He needs to see GI regularly as out pt   Thank you for involving me in the care of this patient. We will continue to follow    LOS: 0 days   Sherri Sear, MD  04/16/2016, 2:01 PM

## 2016-04-16 NOTE — Progress Notes (Signed)
  End of hd 

## 2016-04-16 NOTE — Progress Notes (Signed)
[facility]  ROUNDING NOTE   Subjective:  Patient well known to Stanley [name]. He presents now with acute GI bleed. He's been having melena for at least 2 weeks. This is a recurrent issue for the patient. He has history of AVMs and gastritis. Patient has completed dialysis today.   Objective:  Vital signs in last 24 hours:  Temp:  [98 F (36.7 C)-99.2 F (37.3 C)] 98.6 F (37 C) (02/03 1532) Pulse Rate:  [74-94] 80 (02/03 1532) Resp:  [9-24] 16 (02/03 1532) BP: (98-140)/(59-86) 140/85 (02/03 1532) SpO2:  [93 %-100 %] 100 % (02/03 1532) Weight:  [81.8 kg (180 lb 5.4 oz)-83.9 kg (185 lb)] 81.8 kg (180 lb 5.4 oz) (02/03 1430)  Weight change:  Filed Weights   04/16/16 0351 04/16/16 1105 04/16/16 1430  Weight: 82.4 kg (181 lb 9.6 oz) 82.3 kg (181 lb 7 oz) 81.8 kg (180 lb 5.4 oz)    Intake/Output: I/O last 3 completed shifts: In: 46 [Blood:720] Out: -    Intake/Output this shift:  Total I/O In: 1480 [Blood:1480] Out: 560 [Other:560]  Physical Exam: General: No acute distress  Head: Normocephalic, atraumatic. Moist oral mucosal membranes  Eyes: Anicteric  Neck: Supple, trachea midline  Lungs:  Clear to auscultation, normal effort  Heart: S1S2 no rubs  Abdomen:  Soft, nontender, bowel sounds present  Extremities: trace peripheral edema.  Neurologic: Nonfocal, moving all four extremities  Skin: No lesions  Access: RUE AVF    Basic Metabolic Panel:  Recent Labs Lab 04/15/16 2300 04/16/16 0646 04/16/16 1400  NA 139 135  --   K 3.6 4.0  --   CL 96* 97*  --   CO2 30 27  --   GLUCOSE 112* 173*  --   BUN 37* 43*  --   CREATININE 8.75* 9.80*  --   CALCIUM 8.9 8.5*  --   PHOS  --   --  6.4*    Liver Function Tests: No results for input(s): AST, ALT, ALKPHOS, BILITOT, PROT, ALBUMIN in the last 168 hours. No results for input(s): LIPASE, AMYLASE in the last 168 hours. No results for input(s): AMMONIA in the last 168 hours.  CBC:  Recent Labs Lab  04/15/16 2300 04/16/16 0646 04/16/16 1450  WBC 7.1  --   --   HGB 6.2* 6.4* 8.1*  HCT 20.0* 20.2* 25.4*  MCV 95.2  --   --   PLT 269  --   --     Cardiac Enzymes:  Recent Labs Lab 04/12/16 1201 04/16/16 1400  TROPONINI 0.09* 0.10*    BNP: Invalid input(s): POCBNP  CBG: No results for input(s): GLUCAP in the last 168 hours.  Microbiology: Results for orders placed or performed during the hospital encounter of 04/15/16  MRSA PCR Screening     Status: None   Collection Time: 04/16/16  4:35 AM  Result Value Ref Range Status   MRSA by PCR NEGATIVE NEGATIVE Final    Comment:        The GeneXpert MRSA Assay (FDA approved for NASAL specimens only), is one component of a comprehensive MRSA colonization surveillance program. It is not intended to diagnose MRSA infection nor to guide or monitor treatment for MRSA infections.     Coagulation Studies:  Recent Labs  04/16/16 0122  LABPROT 16.3*  INR 1.30    Urinalysis: No results for input(s): COLORURINE, LABSPEC, PHURINE, GLUCOSEU, HGBUR, BILIRUBINUR, KETONESUR, PROTEINUR, UROBILINOGEN, NITRITE, LEUKOCYTESUR in the last 72 hours.  Invalid input(s): APPERANCEUR  Imaging: Stanley [name] Venous Img Lower Unilateral Left  Result Date: 04/16/2016 CLINICAL DATA:  Left leg pain and swelling for 1 day. Concern for DVT. EXAM: LEFT LOWER EXTREMITY VENOUS DOPPLER ULTRASOUND TECHNIQUE: Gray-scale sonography with graded compression, as well as color Doppler and duplex ultrasound were performed to evaluate the lower extremity deep venous systems from the level of the common femoral vein and including the common femoral, femoral, profunda femoral, popliteal and calf veins including the posterior tibial, peroneal and gastrocnemius veins when visible. The superficial great saphenous vein was also interrogated. Spectral Doppler was utilized to evaluate flow at rest and with distal augmentation maneuvers in the common femoral, femoral and  popliteal veins. COMPARISON:  Lower extremity duplex 06/18/2011 FINDINGS: Contralateral Common Femoral Vein: Respiratory phasicity is normal and symmetric with the symptomatic side. No evidence of thrombus. Normal compressibility. Common Femoral Vein: No evidence of thrombus. Normal compressibility, respiratory phasicity and response to augmentation. Saphenofemoral Junction: No evidence of thrombus. Normal compressibility and flow on color Doppler imaging. Profunda Femoral Vein: No evidence of thrombus. Normal compressibility and flow on color Doppler imaging. Femoral Vein: No evidence of thrombus. Normal compressibility, respiratory phasicity and response to augmentation. Popliteal Vein: No evidence of thrombus. Normal compressibility, respiratory phasicity and response to augmentation. Calf Veins: No evidence of thrombus. Normal compressibility and flow on color Doppler imaging. Superficial Great Saphenous Vein: No evidence of thrombus. Normal compressibility and flow on color Doppler imaging. Venous Reflux:  None. Other Findings: Subcutaneous edema noted about the popliteal area and calf. IMPRESSION: No evidence of left lower extremity deep venous thrombosis. Soft tissue edema of the calf. Electronically Signed   By: Jeb Levering M.D.   On: 04/16/2016 01:16     Medications:   . sodium chloride 50 mL/hr at 04/16/16 0527  . sodium chloride     . sodium chloride  10 mL/hr Intravenous Once  . sodium chloride   Intravenous Once  . calcium acetate  2,001 mg Oral TID WC  . cinacalcet  30 mg Oral Q supper  . gabapentin  300 mg Oral Daily  . pantoprazole (PROTONIX) IV  40 mg Intravenous Q12H  . sodium chloride flush  3 mL Intravenous Q12H   acetaminophen **OR** acetaminophen, ondansetron **OR** ondansetron (ZOFRAN) IV  Assessment/ Plan:  56 y.o. male with coronary artery disease, hypertension, peripheral vascular disease, hyperlipidemia, anemia, hx of gastric AVMs.   [facility]  1.  ESRD on HD TTS.  Patient completed hemodialysis today. He received blood transitioned during dialysis. We will plan for dialysis again on Tuesday or sooner if needed.  2. Anemia of chronic kidney disease/GI bleed secondary to gastric AVMs. Appreciate gastroneurology input. Patient is maintaining on Protonix 40 mg IV every 12 hours. He received blood transitioned 2 units. Continue to monitor CBC.  3. Secondary hyperparathyroidism. Phosphorus currently 6.4. Patient is maintained on calcium acetate 3 tablets by mouth 3 times a day with meals as well as Sensipar. Continue to monitor bone mineral metabolism parameters.  4.  Thanks for consultation.   LOS: 0 Marline Morace 2/3/20183:49 PM

## 2016-04-16 NOTE — Progress Notes (Signed)
A&O. Admitted from home through the ED for GI bleed. Receiving blood transfusion upon arriving to the floor. Complained of leg pain, given tylenol. On hemodialysis TTS.

## 2016-04-17 ENCOUNTER — Encounter
Admission: EM | Discharge status: Against Medical Advice | Payer: Self-pay | Source: Home / Self Care | Attending: Internal Medicine

## 2016-04-17 ENCOUNTER — Inpatient Hospital Stay: Payer: Medicare Other | Admitting: Anesthesiology

## 2016-04-17 DIAGNOSIS — K922 Gastrointestinal hemorrhage, unspecified: Secondary | ICD-10-CM

## 2016-04-17 DIAGNOSIS — D62 Acute posthemorrhagic anemia: Secondary | ICD-10-CM

## 2016-04-17 DIAGNOSIS — R778 Other specified abnormalities of plasma proteins: Secondary | ICD-10-CM

## 2016-04-17 DIAGNOSIS — F1010 Alcohol abuse, uncomplicated: Secondary | ICD-10-CM

## 2016-04-17 DIAGNOSIS — R7989 Other specified abnormal findings of blood chemistry: Secondary | ICD-10-CM

## 2016-04-17 DIAGNOSIS — Z9889 Other specified postprocedural states: Secondary | ICD-10-CM

## 2016-04-17 HISTORY — PX: ESOPHAGOGASTRODUODENOSCOPY (EGD) WITH PROPOFOL: SHX5813

## 2016-04-17 LAB — TYPE AND SCREEN
BLOOD PRODUCT EXPIRATION DATE: 201802242359
BLOOD PRODUCT EXPIRATION DATE: 201802242359
Blood Product Expiration Date: 201802242359
ISSUE DATE / TIME: 201802030048
ISSUE DATE / TIME: 201802031140
ISSUE DATE / TIME: 201802031320
UNIT TYPE AND RH: 6200
UNIT TYPE AND RH: 6200
UNIT TYPE AND RH: 6200

## 2016-04-17 LAB — GLUCOSE, CAPILLARY: GLUCOSE-CAPILLARY: 94 mg/dL (ref 65–99)

## 2016-04-17 LAB — PREPARE RBC (CROSSMATCH)

## 2016-04-17 LAB — HEMOGLOBIN: Hemoglobin: 8.8 g/dL — ABNORMAL LOW (ref 13.0–18.0)

## 2016-04-17 SURGERY — ESOPHAGOGASTRODUODENOSCOPY (EGD) WITH PROPOFOL
Anesthesia: General

## 2016-04-17 MED ORDER — PROPOFOL 10 MG/ML IV BOLUS
INTRAVENOUS | Status: DC | PRN
  Administered 2016-04-17: 20 mg via INTRAVENOUS
  Administered 2016-04-17: 100 mg via INTRAVENOUS

## 2016-04-17 MED ORDER — MAGNESIUM CITRATE PO SOLN
1.0000 | Freq: Once | ORAL | Status: DC
  Filled 2016-04-17: qty 296

## 2016-04-17 MED ORDER — SODIUM CHLORIDE 0.9 % IV SOLN: INTRAVENOUS | Status: DC

## 2016-04-17 MED ORDER — PROPOFOL 10 MG/ML IV BOLUS
INTRAVENOUS | Status: AC
  Filled 2016-04-17: qty 40

## 2016-04-17 MED ORDER — PROPOFOL 500 MG/50ML IV EMUL
INTRAVENOUS | Status: DC | PRN
  Administered 2016-04-17: 150 ug/kg/min via INTRAVENOUS

## 2016-04-17 MED ORDER — PEG 3350-KCL-NA BICARB-NACL 420 G PO SOLR
4000.0000 mL | Freq: Once | ORAL | Status: DC
Start: 2016-04-17 — End: 2016-04-17

## 2016-04-17 NOTE — Op Note (Signed)
[facility] Gastroenterology Patient Name: Stanley [name] Procedure Date: 04/17/2016 10:08 AM MRN: [id number] Account #: [id number] Date of Birth: [birth year] Admit Type: Inpatient Age: 56 Room: [facility] ENDO ROOM 4 Gender: Male Note Status: Finalized Procedure:            Small bowel enteroscopy Indications:          Suspected upper gastrointestinal bleeding in patient                        with unexplained iron deficiency anemia, Melena Providers:            Lin Landsman MD, MD Referring MD:         Dyke Maes. Aletta Edouard (Referring MD), Romie Jumper, MD (Referring MD) Medicines:            Monitored Anesthesia Care Complications:        No immediate complications. Procedure:            Pre-Anesthesia Assessment:                       - Prior to the procedure, a History and Physical was                        performed, and patient medications and allergies were                        reviewed. The patient is competent. The risks and                        benefits of the procedure and the sedation options and                        risks were discussed with the patient. All questions                        were answered and informed consent was obtained.                        Patient identification and proposed procedure were                        verified by the physician, the nurse, the                        anesthesiologist, the anesthetist and the technician in                        the pre-procedure area in the procedure room. Mental                        Status Examination: alert and oriented. Airway                        Examination: normal oropharyngeal airway and neck                        mobility. Respiratory Examination: clear to  auscultation. CV Examination: normal. Prophylactic                        Antibiotics: The patient does not require prophylactic   antibiotics. Prior Anticoagulants: The patient has                        taken no previous anticoagulant or antiplatelet agents.                        ASA Grade Assessment: III - A patient with severe                        systemic disease. After reviewing the risks and                        benefits, the patient was deemed in satisfactory                        condition to undergo the procedure. The anesthesia plan                        was to use monitored anesthesia care (MAC). Immediately                        prior to administration of medications, the patient was                        re-assessed for adequacy to receive sedatives. The                        heart rate, respiratory rate, oxygen saturations, blood                        pressure, adequacy of pulmonary ventilation, and                        response to care were monitored throughout the                        procedure. The physical status of the patient was                        re-assessed after the procedure.                       After obtaining informed consent, the endoscope was                        passed under direct vision. Throughout the procedure,                        the patient's blood pressure, pulse, and oxygen                        saturations were monitored continuously. The Endoscope                        was introduced through the mouth, and advanced to the  fourth part of duodenum. After obtaining informed                        consent, the endoscope was passed under direct vision.                        Throughout the procedure, the patient's blood pressure,                        pulse, and oxygen saturations were monitored                        continuously. Findings:      The esophagus was normal.      The stomach was normal.      The examined duodenum was normal.      Push enteroscopy was performed. Unable to advance beyond jejunum due to        significant looping Impression:           - Normal esophagus.                       - Normal stomach.                       - Normal examined duodenum.                       - No specimens collected. Recommendation:       - Return patient to hospital ward for ongoing care.                       - Resume previous diet today.                       - To visualize the small bowel, perform video capsule                        endoscopy at appointment to be scheduled.                       - Continue present medications.                       - Can stop PPI                       - Abstain from alcohol Procedure Code(s):    --- Professional ---                       [phone], Small intestinal endoscopy, enteroscopy beyond                        second portion of duodenum, not including ileum;                        diagnostic, including collection of specimen(s) by                        brushing or washing, when performed (separate procedure) Diagnosis Code(s):    --- Professional ---  D50.9, Iron deficiency anemia, unspecified                       K92.1, Melena (includes Hematochezia) CPT copyright 2016 American Medical Association. All rights reserved. The codes documented in this report are preliminary and upon coder review may  be revised to meet current compliance requirements. Dr. Ulyess Mort Lin Landsman MD, MD 04/17/2016 11:13:49 AM This report has been signed electronically. Number of Addenda: 0 Note Initiated On: 04/17/2016 10:08 AM      [facility]

## 2016-04-17 NOTE — Anesthesia Postprocedure Evaluation (Signed)
Anesthesia Post Note  Patient: Stanley [name]  Procedure(s) Performed: Procedure(s) (LRB): ESOPHAGOGASTRODUODENOSCOPY (EGD) WITH PROPOFOL (N/A)  Patient location during evaluation: PACU Anesthesia Type: General Level of consciousness: awake and alert and oriented Pain management: pain level controlled Vital Signs Assessment: post-procedure vital signs reviewed and stable Respiratory status: spontaneous breathing, nonlabored ventilation and respiratory function stable Cardiovascular status: blood pressure returned to baseline and stable Postop Assessment: no signs of nausea or vomiting Anesthetic complications: no     Last Vitals:  Vitals:   04/17/16 1211 04/17/16 1227  BP: 119/78 116/64  Pulse: 66 70  Resp: 14 18  Temp: 36.6 C 36.5 C    Last Pain:  Vitals:   04/17/16 1227  TempSrc: Oral  PainSc:                  Luan Urbani

## 2016-04-17 NOTE — Transfer of Care (Signed)
Immediate Anesthesia Transfer of Care Note  Patient: Stanley [name]  Procedure(s) Performed: Procedure(s): ESOPHAGOGASTRODUODENOSCOPY (EGD) WITH PROPOFOL (N/A)  Patient Location: PACU  Anesthesia Type:General  Level of Consciousness: sedated  Airway & Oxygen Therapy: Patient Spontanous Breathing and Patient connected to face mask oxygen  Post-op Assessment: Report given to RN and Post -op Vital signs reviewed and stable  Post vital signs: Reviewed and stable  Last Vitals:  Vitals:   04/17/16 0939 04/17/16 1110  BP: 119/72 (!) 93/54  Pulse: 73 74  Resp: 20 13  Temp: 43.8 C     Complications: No apparent anesthesia complications

## 2016-04-17 NOTE — Anesthesia Post-op Follow-up Note (Cosign Needed)
Anesthesia QCDR form completed.        

## 2016-04-17 NOTE — Progress Notes (Signed)
Pt returns from endo, VSS, no complaints of pain, SR on the monitor. Pt states he does not want to stay for capsule study tomorrow. I will continue to assess.

## 2016-04-17 NOTE — Progress Notes (Addendum)
Pt leaves AMA, pt signed appropriate paper and placed on chart.  MD aware.

## 2016-04-17 NOTE — Discharge Summary (Signed)
[place] [facility] NAME: Stanley [name]    MR#:  [id number]  DATE OF BIRTH:  [birth year]  DATE OF ADMISSION:  04/15/2016 ADMITTING PHYSICIAN: Saundra Shelling, MD  DATE OF DISCHARGE: No discharge date for patient encounter.  PRIMARY CARE PHYSICIAN: LATEEF, Mora Appl, MD     ADMISSION DIAGNOSIS:  Acute blood loss anemia [D62] Weakness [R53.1] ESRD (end stage renal disease) on dialysis ([place]) [N18.6, Z99.2] Gastrointestinal hemorrhage, unspecified gastrointestinal hemorrhage type [K92.2]  DISCHARGE DIAGNOSIS:  Principal Problem:   Acute posthemorrhagic anemia Active Problems:   Gastrointestinal bleed   History of esophagogastroduodenoscopy (EGD)   Elevated troponin   Alcohol abuse   SECONDARY DIAGNOSIS:   Past Medical History:  Diagnosis Date  . Alcohol abuse   . Cirrhosis ([place])   . Coronary artery disease 2009  . Diabetic peripheral neuropathy ([facility])   . Drug abuse   . End stage renal disease on dialysis [facility]) NEPHROLOGIST-   DR [facility]  IN [place]   HEMODIALYSIS --   TUES/  THURS/  SAT  . GERD (gastroesophageal reflux disease)   . Hyperlipidemia   . Hypertension   . PAD (peripheral artery disease) ([place])   . Suicidal ideation    & HOMICIDAL IDEATION --  06-16-2013   ADMITTED TO BEHAVIOR HEALTH    .pro HOSPITAL COURSE:   The patient is 56 year old African-American male with past medical history significant for history of alcohol abuse, coronary artery disease, peripheral vascular disease, who is on aspirin therapy, and stage renal disease, hypertension, hyperlipidemia, peripheral vascular disease, who presents to the hospital with complaints of black stools for 2 weeks, dizziness, weakness. On arrival to emergency room, his hemoglobin level was found to be 6.2, stool guaiac was strongly positive. He was transfused 1 unit of packed red blood cells. In emergency room, 2 units on the floor, after which hemoglobin level  improved to 8.8 on the day of discharge. He was seen by gastroenterologist and underwent EGD, which was normal. Endoscopy Was Recommended, However, Patient Refused and Left AGAINST MEDICAL ADVICE  Discussion by problem:  #1. Gastrointestinal bleed with melena, continue  PPI, , gastroenterologist consultation is appreciated, EGD was unremarkable, capsule endoscopy was recommended, however, patient refused and left AGAINST MEDICAL ADVICE, I'm afraid that he will likely be readmitted soon was very similar problems, which was discussed with him prior to leaving the hospital. He told us that he wanted to eat and this was more important than living. No history of liver cirrhosis, according to gastroenterologist #2. Acute posthemorrhagic anemia, transfused 3 units of packed red blood cells during this admission, hemoglobin level has improved to 8.8 on the day of discharge #3. Alcohol abuse, patient denies any recent alcohol use, he had no symptoms of withdrawal #4. End-stage renal disease, continue dialysis per nephrologist #5. Elevated troponin, due to demand ischemia,  no chest pains, no further interventions, patient is to hold aspirin therapy due to recent bleed, resume it was in 1-2 weeks after discharge  DISCHARGE CONDITIONS:   Guarded  CONSULTS OBTAINED:  Treatment Team:  Lin Landsman, MD Munsoor Lateef, MD  DRUG ALLERGIES:  No Known Allergies  DISCHARGE MEDICATIONS:   Current Discharge Medication List    CONTINUE these medications which have NOT CHANGED   Details  amLODipine (NORVASC) 5 MG tablet Take 5 mg by mouth daily.    b complex-vitamin c-folic acid (NEPHRO-VITE) 0.8 MG TABS tablet Take 1 tablet by mouth daily.    calcium  acetate (PHOSLO) 667 MG capsule Take 2,001 mg by mouth 3 (three) times daily with meals.    cinacalcet (SENSIPAR) 30 MG tablet Take 30 mg by mouth daily with supper.    furosemide (LASIX) 80 MG tablet Take 1 tablet by mouth daily.    gabapentin  (NEURONTIN) 300 MG capsule Take 1 capsule (300 mg total) by mouth daily. Qty: 30 capsule, Refills: 0    hydrOXYzine (VISTARIL) 25 MG capsule Take 1 capsule (25 mg total) by mouth 3 (three) times daily as needed for itching. Qty: 21 capsule, Refills: 0    labetalol (NORMODYNE) 100 MG tablet Take 1 tablet (100 mg total) by mouth daily. Qty: 30 tablet, Refills: 0    nitroGLYCERIN (NITROSTAT) 0.4 MG SL tablet Place 1 tablet (0.4 mg total) under the tongue every 5 (five) minutes as needed. Qty: 25 tablet, Refills: 6    pantoprazole (PROTONIX) 40 MG tablet Take 1 tablet (40 mg total) by mouth 2 (two) times daily. Qty: 60 tablet, Refills: 0      STOP taking these medications     aspirin EC 81 MG tablet          DISCHARGE INSTRUCTIONS:    The patient is to follow-up with primary care physician  If you experience worsening of your admission symptoms, develop shortness of breath, life threatening emergency, suicidal or homicidal thoughts you must seek medical attention immediately by calling 911 or calling your MD immediately  if symptoms less severe.  You Must read complete instructions/literature along with all the possible adverse reactions/side effects for all the Medicines you take and that have been prescribed to you. Take any new Medicines after you have completely understood and accept all the possible adverse reactions/side effects.   Please note  You were cared for by a hospitalist during your hospital stay. If you have any questions about your discharge medications or the care you received while you were in the hospital after you are discharged, you can call the unit and asked to speak with the hospitalist on call if the hospitalist that took care of you is not available. Once you are discharged, your primary care physician will handle any further medical issues. Please note that NO REFILLS for any discharge medications will be authorized once you are discharged, as it is  imperative that you return to your primary care physician (or establish a relationship with a primary care physician if you do not have one) for your aftercare needs so that they can reassess your need for medications and monitor your lab values.    Today   CHIEF COMPLAINT:   Chief Complaint  Patient presents with  . Weakness    HISTORY OF PRESENT ILLNESS:  Stanley [name]  is a 56 y.o. male with a known history of alcohol abuse, coronary artery disease, peripheral vascular disease, who is on aspirin therapy, and stage renal disease, hypertension, hyperlipidemia, peripheral vascular disease, who presents to the hospital with complaints of black stools for 2 weeks, dizziness, weakness. On arrival to emergency room, his hemoglobin level was found to be 6.2, stool guaiac was strongly positive. He was transfused 1 unit of packed red blood cells. In emergency room, 2 units on the floor, after which hemoglobin level improved to 8.8 on the day of discharge. He was seen by gastroenterologist and underwent EGD, which was normal. Endoscopy Was Recommended, However, Patient Refused and Left AGAINST MEDICAL ADVICE  Discussion by problem:  #1. Gastrointestinal bleed with melena, continue  PPI, ,  gastroenterologist consultation is appreciated, EGD was unremarkable, capsule endoscopy was recommended, however, patient refused and left AGAINST MEDICAL ADVICE, I'm afraid that he will likely be readmitted soon was very similar problems, which was discussed with him prior to leaving the hospital. He told us that he wanted to eat and this was more important than living. No history of liver cirrhosis, according to gastroenterologist #2. Acute posthemorrhagic anemia, transfused 3 units of packed red blood cells during this admission, hemoglobin level has improved to 8.8 on the day of discharge #3. Alcohol abuse, patient denies any recent alcohol use, he had no symptoms of withdrawal #4. End-stage renal disease, continue  dialysis per nephrologist #5. Elevated troponin, due to demand ischemia,  no chest pains, no further interventions, patient is to hold aspirin therapy due to recent bleed, resume it was in 1-2 weeks after discharge     VITAL SIGNS:  Blood pressure 116/64, pulse 70, temperature 97.7 F (36.5 C), temperature source Oral, resp. rate 18, height 6\' 3"  (1.905 m), weight 83.9 kg (185 lb), SpO2 96 %.  I/O:   Intake/Output Summary (Last 24 hours) at 04/17/16 1518 Last data filed at 04/17/16 1214  Gross per 24 hour  Intake              870 ml  Output                0 ml  Net              870 ml    PHYSICAL EXAMINATION:  GENERAL:  56 y.o.-year-old patient lying in the bed with no acute distress.  EYES: Pupils equal, round, reactive to light and accommodation. No scleral icterus. Extraocular muscles intact.  HEENT: Head atraumatic, normocephalic. Oropharynx and nasopharynx clear.  NECK:  Supple, no jugular venous distention. No thyroid enlargement, no tenderness.  LUNGS: Normal breath sounds bilaterally, no wheezing, rales,rhonchi or crepitation. No use of accessory muscles of respiration.  CARDIOVASCULAR: S1, S2 normal. No murmurs, rubs, or gallops.  ABDOMEN: Soft, non-tender, non-distended. Bowel sounds present. No organomegaly or mass.  EXTREMITIES: No pedal edema, cyanosis, or clubbing.  NEUROLOGIC: Cranial nerves II through XII are intact. Muscle strength 5/5 in all extremities. Sensation intact. Gait not checked.  PSYCHIATRIC: The patient is alert and oriented x 3.  SKIN: No obvious rash, lesion, or ulcer.   DATA REVIEW:   CBC  Recent Labs Lab 04/15/16 2300  04/16/16 1450 04/17/16 1322  WBC 7.1  --   --   --   HGB 6.2*  < > 8.1* 8.8*  HCT 20.0*  < > 25.4*  --   PLT 269  --   --   --   < > = values in this interval not displayed.  Chemistries   Recent Labs Lab 04/16/16 0646  NA 135  K 4.0  CL 97*  CO2 27  GLUCOSE 173*  BUN 43*  CREATININE 9.80*  CALCIUM 8.5*     Cardiac Enzymes  Recent Labs Lab 04/16/16 1400  TROPONINI 0.10*    Microbiology Results  Results for orders placed or performed during the hospital encounter of 04/15/16  MRSA PCR Screening     Status: None   Collection Time: 04/16/16  4:35 AM  Result Value Ref Range Status   MRSA by PCR NEGATIVE NEGATIVE Final    Comment:        The GeneXpert MRSA Assay (FDA approved for NASAL specimens only), is one component of a comprehensive MRSA colonization surveillance  program. It is not intended to diagnose MRSA infection nor to guide or monitor treatment for MRSA infections.     RADIOLOGY:  US Venous Img Lower Unilateral Left  Result Date: 04/16/2016 CLINICAL DATA:  Left leg pain and swelling for 1 day. Concern for DVT. EXAM: LEFT LOWER EXTREMITY VENOUS DOPPLER ULTRASOUND TECHNIQUE: Gray-scale sonography with graded compression, as well as color Doppler and duplex ultrasound were performed to evaluate the lower extremity deep venous systems from the level of the common femoral vein and including the common femoral, femoral, profunda femoral, popliteal and calf veins including the posterior tibial, peroneal and gastrocnemius veins when visible. The superficial great saphenous vein was also interrogated. Spectral Doppler was utilized to evaluate flow at rest and with distal augmentation maneuvers in the common femoral, femoral and popliteal veins. COMPARISON:  Lower extremity duplex 06/18/2011 FINDINGS: Contralateral Common Femoral Vein: Respiratory phasicity is normal and symmetric with the symptomatic side. No evidence of thrombus. Normal compressibility. Common Femoral Vein: No evidence of thrombus. Normal compressibility, respiratory phasicity and response to augmentation. Saphenofemoral Junction: No evidence of thrombus. Normal compressibility and flow on color Doppler imaging. Profunda Femoral Vein: No evidence of thrombus. Normal compressibility and flow on color Doppler imaging.  Femoral Vein: No evidence of thrombus. Normal compressibility, respiratory phasicity and response to augmentation. Popliteal Vein: No evidence of thrombus. Normal compressibility, respiratory phasicity and response to augmentation. Calf Veins: No evidence of thrombus. Normal compressibility and flow on color Doppler imaging. Superficial Great Saphenous Vein: No evidence of thrombus. Normal compressibility and flow on color Doppler imaging. Venous Reflux:  None. Other Findings: Subcutaneous edema noted about the popliteal area and calf. IMPRESSION: No evidence of left lower extremity deep venous thrombosis. Soft tissue edema of the calf. Electronically Signed   By: Jeb Levering M.D.   On: 04/16/2016 01:16    EKG:   Orders placed or performed during the hospital encounter of 04/15/16  . ED EKG  . ED EKG  . EKG 12-Lead  . EKG 12-Lead      Management plans discussed with the patient, family and they are in agreement.  CODE STATUS:     Code Status Orders        Start     Ordered   04/16/16 0357  Full code  Continuous     04/16/16 0356    Code Status History    Date Active Date Inactive Code Status Order ID Comments User Context   01/19/2016  3:13 PM 01/22/2016  2:14 PM Full Code [id number]  Loletha Grayer, MD ED   09/24/2015 10:33 AM 09/25/2015  1:31 PM Full Code [id number]  Loletha Grayer, MD ED   05/16/2015  2:17 PM 05/17/2015  8:57 PM Full Code [id number]  Idelle Crouch, MD Inpatient   04/30/2015  8:08 PM 05/08/2015  7:40 PM Full Code [id number]  Vaughan Basta, MD Inpatient   04/06/2015  5:07 AM 04/07/2015  5:36 PM Full Code [id number]  Lance Coon, MD Inpatient   03/23/2015 11:07 AM 03/27/2015 12:46 PM Full Code [id number]  Bettey Costa, MD Inpatient   06/19/2013 11:32 AM 06/21/2013  5:49 PM Full Code [id number]  Cristal Ford, DO Inpatient   06/16/2013  5:26 PM 06/19/2013 11:32 AM Full Code [id number]  Margarita Mail, PA-C ED   06/05/2013  3:33 PM 06/06/2013  6:56 PM Full Code [id number]   Theodoro Kos, DO Inpatient   06/02/2013  9:01 AM 06/05/2013  3:33 PM Full Code [id number]  Velvet Bathe, MD Inpatient  06/02/2013  8:40 AM 06/02/2013  9:01 AM Full Code [id number]  Velvet Bathe, MD Inpatient      TOTAL TIME TAKING CARE OF THIS PATIENT: 40  minutes.    Theodoro Grist M.D on 04/17/2016 at 3:18 PM  Between 7am to 6pm - Pager - [phone]  After 6pm go to [email or web address] - password [email or web address] [facility]  Office  [phone]  CC: Primary care physician; Dagoberto Ligas, MD

## 2016-04-17 NOTE — Progress Notes (Addendum)
Performed push enteroscopy, unsuccessful due to significant looping in stomach Visualized small bowel normal We will plan VCE tomorrow CLD today   Rohini R Vanga, MBBS, MD [facility]

## 2016-04-17 NOTE — Plan of Care (Signed)
Problem: Fluid Volume: Goal: Compliance with measures to maintain balanced fluid volume will improve Outcome: Not Progressing Pt non compliant with fluid restrictions

## 2016-04-17 NOTE — Anesthesia Preprocedure Evaluation (Signed)
Anesthesia Evaluation  Patient identified by MRN, date of birth, ID band Patient awake    Reviewed: Allergy & Precautions, NPO status , Patient's Chart, lab work & pertinent test results  History of Anesthesia Complications Negative for: history of anesthetic complications  Airway Mallampati: II  TM Distance: >3 FB Neck ROM: Full    Dental  (+) Poor Dentition, Missing   Pulmonary neg sleep apnea, neg COPD, Current Smoker,    breath sounds clear to auscultation- rhonchi (-) wheezing      Cardiovascular hypertension, + CAD, + Past MI and + CABG   Rhythm:Regular Rate:Normal - Systolic murmurs and - Diastolic murmurs Echo 4/81/85: - Left ventricle: The cavity size was mildly dilated. Systolic   function was normal. The estimated ejection fraction was in the   range of 50% to 55%. Wall motion was normal; there were no   regional wall motion abnormalities. Left ventricular diastolic   function parameters were normal. - Mitral valve: There was moderate regurgitation directed   eccentrically. - Left atrium: The atrium was moderately dilated. - Right ventricle: Systolic function was mildly reduced. - Right atrium: The atrium was mildly dilated. - Tricuspid valve: There was moderate-severe regurgitation. - Pulmonary arteries: Systolic pressure was moderately elevated. PA   peak pressure: 54 mm Hg (S).   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   Endo/Other  diabetes  Renal/GU ESRF and DialysisRenal disease (last dialysis yesterday)     Musculoskeletal   Abdominal (+) - obese,   Peds  Hematology  (+) anemia ,   Anesthesia Other Findings Past Medical History: No date: Alcohol abuse No date: Cirrhosis ([place]) No date: Coronary artery disease No date: Diabetic peripheral neuropathy ([facility]) No date: Drug abuse NEPHROLOGIST-   DR [facility]  IN [place]: End stage  renal disease on dialysis ([place])     Comment: HEMODIALYSIS --   TUES/  THURS/  SAT No date: GERD (gastroesophageal reflux disease) No date: Hypertension No date: Suicidal ideation     Comment: & HOMICIDAL IDEATION --  06-16-2013   ADMITTED              TO BEHAVIOR HEALTH   Reproductive/Obstetrics                             Anesthesia Physical  Anesthesia Plan  ASA: III  Anesthesia Plan: General   Post-op Pain Management:    Induction: Intravenous  Airway Management Planned: Natural Airway  Additional Equipment:   Intra-op Plan:   Post-operative Plan:   Informed Consent: I have reviewed the patients History and Physical, chart, labs and discussed the procedure including the risks, benefits and alternatives for the proposed anesthesia with the patient or authorized representative who has indicated his/her understanding and acceptance.   Dental advisory given  Plan Discussed with: CRNA and Anesthesiologist  Anesthesia Plan Comments:         Anesthesia Quick Evaluation

## 2016-04-17 NOTE — Anesthesia Procedure Notes (Signed)
Date/Time: 04/17/2016 10:35 AM Performed by: Doreen Salvage Pre-anesthesia Checklist: Patient identified, Emergency Drugs available, Suction available and Patient being monitored Patient Re-evaluated:Patient Re-evaluated prior to inductionOxygen Delivery Method: Nasal cannula Intubation Type: IV induction Dental Injury: Teeth and Oropharynx as per pre-operative assessment  Comments: Nasal cannula with etCO2 monitoring

## 2016-04-18 ENCOUNTER — Encounter: Payer: Self-pay | Admitting: Gastroenterology

## 2016-04-19 DIAGNOSIS — Z992 Dependence on renal dialysis: Secondary | ICD-10-CM | POA: Diagnosis not present

## 2016-04-19 DIAGNOSIS — D631 Anemia in chronic kidney disease: Secondary | ICD-10-CM | POA: Diagnosis not present

## 2016-04-19 DIAGNOSIS — N186 End stage renal disease: Secondary | ICD-10-CM | POA: Diagnosis not present

## 2016-04-19 DIAGNOSIS — N2581 Secondary hyperparathyroidism of renal origin: Secondary | ICD-10-CM | POA: Diagnosis not present

## 2016-04-19 DIAGNOSIS — D509 Iron deficiency anemia, unspecified: Secondary | ICD-10-CM | POA: Diagnosis not present

## 2016-04-20 ENCOUNTER — Telehealth: Payer: Self-pay | Admitting: Cardiology

## 2016-04-20 ENCOUNTER — Ambulatory Visit: Payer: Medicare Other

## 2016-04-20 NOTE — Telephone Encounter (Signed)
No answer.  No voicemail setup

## 2016-04-20 NOTE — Telephone Encounter (Signed)
[facility] calling to let us know patient did not show for their myoview this morning

## 2016-04-21 ENCOUNTER — Emergency Department
Admission: EM | Admit: 2016-04-21 | Discharge: 2016-04-21 | Disposition: A | Discharge status: Home / Self Care | Payer: Medicare Other | Attending: Emergency Medicine | Admitting: Emergency Medicine

## 2016-04-21 ENCOUNTER — Emergency Department: Payer: Medicare Other

## 2016-04-21 ENCOUNTER — Other Ambulatory Visit: Payer: Medicare Other

## 2016-04-21 DIAGNOSIS — N2581 Secondary hyperparathyroidism of renal origin: Secondary | ICD-10-CM | POA: Diagnosis not present

## 2016-04-21 DIAGNOSIS — S2241XA Multiple fractures of ribs, right side, initial encounter for closed fracture: Secondary | ICD-10-CM | POA: Insufficient documentation

## 2016-04-21 DIAGNOSIS — W0110XA Fall on same level from slipping, tripping and stumbling with subsequent striking against unspecified object, initial encounter: Secondary | ICD-10-CM | POA: Insufficient documentation

## 2016-04-21 DIAGNOSIS — Z79899 Other long term (current) drug therapy: Secondary | ICD-10-CM | POA: Diagnosis not present

## 2016-04-21 DIAGNOSIS — Y929 Unspecified place or not applicable: Secondary | ICD-10-CM | POA: Insufficient documentation

## 2016-04-21 DIAGNOSIS — D631 Anemia in chronic kidney disease: Secondary | ICD-10-CM | POA: Diagnosis not present

## 2016-04-21 DIAGNOSIS — Z992 Dependence on renal dialysis: Secondary | ICD-10-CM | POA: Diagnosis not present

## 2016-04-21 DIAGNOSIS — S299XXA Unspecified injury of thorax, initial encounter: Secondary | ICD-10-CM | POA: Diagnosis present

## 2016-04-21 DIAGNOSIS — W19XXXA Unspecified fall, initial encounter: Secondary | ICD-10-CM

## 2016-04-21 DIAGNOSIS — Y999 Unspecified external cause status: Secondary | ICD-10-CM | POA: Insufficient documentation

## 2016-04-21 DIAGNOSIS — I120 Hypertensive chronic kidney disease with stage 5 chronic kidney disease or end stage renal disease: Secondary | ICD-10-CM | POA: Diagnosis not present

## 2016-04-21 DIAGNOSIS — F17210 Nicotine dependence, cigarettes, uncomplicated: Secondary | ICD-10-CM | POA: Diagnosis not present

## 2016-04-21 DIAGNOSIS — S20211A Contusion of right front wall of thorax, initial encounter: Secondary | ICD-10-CM | POA: Diagnosis not present

## 2016-04-21 DIAGNOSIS — E1122 Type 2 diabetes mellitus with diabetic chronic kidney disease: Secondary | ICD-10-CM | POA: Insufficient documentation

## 2016-04-21 DIAGNOSIS — I2510 Atherosclerotic heart disease of native coronary artery without angina pectoris: Secondary | ICD-10-CM | POA: Diagnosis not present

## 2016-04-21 DIAGNOSIS — N186 End stage renal disease: Secondary | ICD-10-CM | POA: Insufficient documentation

## 2016-04-21 DIAGNOSIS — R1011 Right upper quadrant pain: Secondary | ICD-10-CM | POA: Diagnosis not present

## 2016-04-21 DIAGNOSIS — D509 Iron deficiency anemia, unspecified: Secondary | ICD-10-CM | POA: Diagnosis not present

## 2016-04-21 DIAGNOSIS — Y939 Activity, unspecified: Secondary | ICD-10-CM | POA: Insufficient documentation

## 2016-04-21 LAB — HEPATIC FUNCTION PANEL
ALBUMIN: 3.1 g/dL — AB (ref 3.5–5.0)
ALT: 35 U/L (ref 17–63)
AST: 64 U/L — AB (ref 15–41)
Alkaline Phosphatase: 141 U/L — ABNORMAL HIGH (ref 38–126)
BILIRUBIN DIRECT: 0.3 mg/dL (ref 0.1–0.5)
BILIRUBIN TOTAL: 1.1 mg/dL (ref 0.3–1.2)
Indirect Bilirubin: 0.8 mg/dL (ref 0.3–0.9)
Total Protein: 6.5 g/dL (ref 6.5–8.1)

## 2016-04-21 LAB — BASIC METABOLIC PANEL
ANION GAP: 14 (ref 5–15)
BUN: 19 mg/dL (ref 6–20)
CHLORIDE: 97 mmol/L — AB (ref 101–111)
CO2: 23 mmol/L (ref 22–32)
Calcium: 8.4 mg/dL — ABNORMAL LOW (ref 8.9–10.3)
Creatinine, Ser: 6.66 mg/dL — ABNORMAL HIGH (ref 0.61–1.24)
GFR, EST AFRICAN AMERICAN: 10 mL/min — AB (ref >60)
GFR, EST NON AFRICAN AMERICAN: 8 mL/min — AB (ref >60)
Glucose, Bld: 102 mg/dL — ABNORMAL HIGH (ref 65–99)
POTASSIUM: 3.6 mmol/L (ref 3.5–5.1)
SODIUM: 134 mmol/L — AB (ref 135–145)

## 2016-04-21 LAB — CBC
HEMATOCRIT: 27.8 % — AB (ref 40.0–52.0)
Hemoglobin: 9.1 g/dL — ABNORMAL LOW (ref 13.0–18.0)
MCH: 30.3 pg (ref 26.0–34.0)
MCHC: 32.6 g/dL (ref 32.0–36.0)
MCV: 92.9 fL (ref 80.0–100.0)
Platelets: 193 10*3/uL (ref 150–440)
RBC: 2.99 MIL/uL — AB (ref 4.40–5.90)
RDW: 20 % — ABNORMAL HIGH (ref 11.5–14.5)
WBC: 7.7 10*3/uL (ref 3.8–10.6)

## 2016-04-21 MED ORDER — ONDANSETRON HCL 4 MG/2ML IJ SOLN
4.0000 mg | INTRAMUSCULAR | Status: AC
  Administered 2016-04-21: 4 mg via INTRAVENOUS
  Filled 2016-04-21: qty 2

## 2016-04-21 MED ORDER — MORPHINE SULFATE (PF) 4 MG/ML IV SOLN
4.0000 mg | Freq: Once | INTRAVENOUS | Status: AC
  Administered 2016-04-21: 4 mg via INTRAVENOUS
  Filled 2016-04-21: qty 1

## 2016-04-21 MED ORDER — DIPHENHYDRAMINE HCL 50 MG/ML IJ SOLN
12.5000 mg | INTRAMUSCULAR | Status: AC
  Administered 2016-04-21: 12.5 mg via INTRAVENOUS

## 2016-04-21 MED ORDER — OXYCODONE-ACETAMINOPHEN 5-325 MG PO TABS: 1.0000 | ORAL_TABLET | ORAL | 0 refills | Status: DC | PRN

## 2016-04-21 MED ORDER — OXYCODONE-ACETAMINOPHEN 5-325 MG PO TABS
2.0000 | ORAL_TABLET | Freq: Once | ORAL | Status: AC
  Administered 2016-04-21: 2 via ORAL
  Filled 2016-04-21: qty 2

## 2016-04-21 MED ORDER — DIPHENHYDRAMINE HCL 50 MG/ML IJ SOLN
INTRAMUSCULAR | Status: AC
  Administered 2016-04-21: 12.5 mg via INTRAVENOUS
  Filled 2016-04-21: qty 1

## 2016-04-21 NOTE — Telephone Encounter (Signed)
No answer.  No voicemail setup

## 2016-04-21 NOTE — Discharge Instructions (Signed)
Your workup today showed that you have a fracture to one or more ribs.  Unfortunately this type of injury hurts but there is no way to fix it immediately; it must heal over time.  Be sure to take plenty of deep breaths so that you get rid of the "bad air" in your lungs.  If you are given a device called an incentive spirometer, please use it as recommended.  Unless you have been told by your doctor not to do so, we recommend you take ibuprofen 600 mg 3 times daily with meals for no more than 5 days.  You can also take Tylenol 1000 mg every 6 hours for pain.  Take Percocet as prescribed for severe pain. Do not drink alcohol, drive or participate in any other potentially dangerous activities while taking this medication as it may make you sleepy. Do not take this medication with any other sedating medications, either prescription or over-the-counter. If you were prescribed Percocet or Vicodin, do not take these with acetaminophen (Tylenol) as it is already contained within these medications.   This medication is an opiate (or narcotic) pain medication and can be habit forming.  Use it as little as possible to achieve adequate pain control.  Do not use or use it with extreme caution if you have a history of opiate abuse or dependence.  If you are on a pain contract with your primary care doctor or a pain specialist, be sure to let them know you were prescribed this medication today from the [facility] Emergency Department.  This medication is intended for your use only - do not give any to anyone else and keep it in a secure place where nobody else, especially children, have access to it.  It will also cause or worsen constipation, so you may want to consider taking an over-the-counter stool softener while you are taking this medication.  Follow-up at the clinics or with the doctors described in this paperwork.  Return to the emergency department if he develop new or worsening symptoms that concern  you.

## 2016-04-21 NOTE — ED Provider Notes (Signed)
[facility] Emergency Department Provider Note  ____________________________________________   First MD Initiated Contact with Patient 04/21/16 1519     (approximate)  I have reviewed the triage vital signs and the nursing notes.   HISTORY  Chief Complaint Loss of Consciousness and Chest Pain (rib pain from fall)    HPI Stanley [name] is a 56 y.o. male with a complicated medical history as listed below who his been on dialysis for about 13 years who presents after a syncopal episode at dialysis today as well as pain in the left ribs after a fall.  He states that he was getting out of his bath this morningwhen he slipped and struck the right lateral ribs on the side of the tub.  He did not lose consciousness and did not hit his head or his neck.  He sat back down in the tub and had a catch his breath.  He went ahead to dialysis and reportedly got the full course of dialysis.  When the dialysis nurse was in the process of disconnecting him he try to get up and then reports that he lost consciousness for a few seconds.  He states that the pain in his ribs is severe and much worse with movement and he thinks that the pain was why he passed out.  He denies headache, neck pain, chest pain sent for point tenderness in the right lateral ribs, abdominal pain, nausea, vomiting, diarrhea.  He describes the pain as sharp and worse with deep breaths and with movement.  He states that he is not having any shortness of breath except that taking deep breaths hurts more.  He did not sustain any injuries to his arms or his legs.   Past Medical History:  Diagnosis Date  . Alcohol abuse   . Cirrhosis ([place])   . Coronary artery disease 2009  . Diabetic peripheral neuropathy ([place])   . Drug abuse   . End stage renal disease on dialysis [facility]) NEPHROLOGIST-   DR [facility]  IN [place]   HEMODIALYSIS --   TUES/  THURS/  SAT  . GERD (gastroesophageal reflux disease)   . Hyperlipidemia    . Hypertension   . PAD (peripheral artery disease) ([place])   . Suicidal ideation    & HOMICIDAL IDEATION --  06-16-2013   ADMITTED TO BEHAVIOR HEALTH    Patient Active Problem List   Diagnosis Date Noted  . Acute posthemorrhagic anemia 04/17/2016  . Gastrointestinal bleed 04/17/2016  . History of esophagogastroduodenoscopy (EGD) 04/17/2016  . Elevated troponin 04/17/2016  . Alcohol abuse 04/17/2016  . Upper GI bleed 01/19/2016  . Blood in stool   . Angiodysplasia of stomach and duodenum with hemorrhage   . Gastritis   . Esophagitis, unspecified   . GI bleed 05/16/2015  . Acute GI bleeding   . Symptomatic anemia 04/30/2015  . HTN (hypertension) 04/06/2015  . GERD (gastroesophageal reflux disease) 04/06/2015  . HLD (hyperlipidemia) 04/06/2015  . Dyspnea 04/06/2015  . Ascites 04/06/2015  . GIB (gastrointestinal bleeding) 03/23/2015  . Homicidal ideation 06/19/2013  . Suicidal intent 06/19/2013  . Homicidal ideations 06/19/2013  . Hyperkalemia 06/16/2013  . Mandible fracture ([place]) 06/05/2013  . Fracture, mandible ([place]) 06/02/2013  . Coronary atherosclerosis of native coronary artery 06/02/2013  . ESRD on dialysis ([place]) 06/02/2013  . Mandible open fracture ([place]) 06/02/2013    Past Surgical History:  Procedure Laterality Date  . CORONARY ANGIOPLASTY  ?   PT UNABLE TO TELL IF  BEFORE OR AFTER  CABG  . CORONARY ARTERY BYPASS GRAFT  2008  ([place] , [place])   3 VESSEL  . DIALYSIS FISTULA CREATION  LAST SURGERY  APPOX  2008  . ESOPHAGOGASTRODUODENOSCOPY N/A 05/07/2015   Procedure: ESOPHAGOGASTRODUODENOSCOPY (EGD);  Surgeon: Hulen Luster, MD;  Location: [facility] ENDOSCOPY;  Service: Endoscopy;  Laterality: N/A;  . ESOPHAGOGASTRODUODENOSCOPY (EGD) WITH PROPOFOL N/A 05/17/2015   Procedure: ESOPHAGOGASTRODUODENOSCOPY (EGD) WITH PROPOFOL;  Surgeon: [name], MD;  Location: [facility] ENDOSCOPY;  Service: Endoscopy;  Laterality: N/A;  . ESOPHAGOGASTRODUODENOSCOPY (EGD) WITH PROPOFOL N/A 01/20/2016     Procedure: ESOPHAGOGASTRODUODENOSCOPY (EGD) WITH PROPOFOL;  Surgeon: [name], MD;  Location: [facility] ENDOSCOPY;  Service: Endoscopy;  Laterality: N/A;  . ESOPHAGOGASTRODUODENOSCOPY (EGD) WITH PROPOFOL N/A 04/17/2016   Procedure: ESOPHAGOGASTRODUODENOSCOPY (EGD) WITH PROPOFOL;  Surgeon: Lin Landsman, MD;  Location: [facility] ENDOSCOPY;  Service: Gastroenterology;  Laterality: N/A;  . MANDIBULAR HARDWARE REMOVAL N/A 07/29/2013   Procedure: REMOVAL OF ARCH BARS;  Surgeon: Theodoro Kos, DO;  Location: [place];  Service: Plastics;  Laterality: N/A;  . ORIF MANDIBULAR FRACTURE N/A 06/05/2013   Procedure: REPAIR OF MANDIBULAR FRACTURE x 2 with maxillo-mandibular fixation ;  Surgeon: Theodoro Kos, DO;  Location: [facility];  Service: Plastics;  Laterality: N/A;  . PERIPHERAL ARTERIAL STENT GRAFT Left     Prior to Admission medications   Medication Sig Start Date End Date Taking? Authorizing Provider  amLODipine (NORVASC) 5 MG tablet Take 5 mg by mouth daily.    Historical Provider, MD  b complex-vitamin c-folic acid (NEPHRO-VITE) 0.8 MG TABS tablet Take 1 tablet by mouth daily.    Historical Provider, MD  calcium acetate (PHOSLO) 667 MG capsule Take 2,001 mg by mouth 3 (three) times daily with meals.    Historical Provider, MD  cinacalcet (SENSIPAR) 30 MG tablet Take 30 mg by mouth daily with supper.    Historical Provider, MD  furosemide (LASIX) 80 MG tablet Take 1 tablet by mouth daily.    Historical Provider, MD  gabapentin (NEURONTIN) 300 MG capsule Take 1 capsule (300 mg total) by mouth daily. Patient taking differently: Take 300 mg by mouth at bedtime.  03/27/15   Bettey Costa, MD  hydrOXYzine (VISTARIL) 25 MG capsule Take 1 capsule (25 mg total) by mouth 3 (three) times daily as needed for itching. 02/25/16   Jami L Hagler, PA-C  labetalol (NORMODYNE) 100 MG tablet Take 1 tablet (100 mg total) by mouth daily. 01/22/16   Epifanio Lesches, MD  nitroGLYCERIN (NITROSTAT) 0.4 MG SL  tablet Place 1 tablet (0.4 mg total) under the tongue every 5 (five) minutes as needed. 04/12/16   Wende Bushy, MD  oxyCODONE-acetaminophen (ROXICET) 5-325 MG tablet Take 1-2 tablets by mouth every 4 (four) hours as needed for severe pain. 04/21/16   Hinda Kehr, MD  pantoprazole (PROTONIX) 40 MG tablet Take 1 tablet (40 mg total) by mouth 2 (two) times daily. Patient taking differently: Take 40 mg by mouth daily.  01/22/16   Epifanio Lesches, MD    Allergies Patient has no known allergies.  Family History  Problem Relation Age of Onset  . Colon cancer Mother   . Cancer Father   . Cancer Sister     Social History Social History  Substance Use Topics  . Smoking status: Current Every Day Smoker    Packs/day: 0.15    Years: 40.00    Types: Cigarettes  . Smokeless tobacco: Never Used  . Alcohol use No  Comment: pt reports quitting after learning about cirrhosis    Review of Systems Constitutional: No fever/chills Eyes: No visual changes. ENT: No sore throat. Cardiovascular: Brief syncopal episode after dialysis Respiratory: Denies shortness of breath. Gastrointestinal: No abdominal pain.  No nausea, no vomiting.  No diarrhea.  No constipation. Genitourinary: Negative for dysuria. Musculoskeletal: Pain in right lateral ribs after a fall this morning.  Denies neck pain and back pain and denies any injuries in his extremities Skin: Negative for rash. Neurological: Negative for headaches, focal weakness or numbness.  10-point ROS otherwise negative.  ____________________________________________   PHYSICAL EXAM:  VITAL SIGNS: ED Triage Vitals  Enc Vitals Group     BP 04/21/16 1510 (!) 118/56     Pulse Rate 04/21/16 1510 83     Resp 04/21/16 1510 20     Temp 04/21/16 1510 97.8 F (36.6 C)     Temp Source 04/21/16 1510 Oral     SpO2 04/21/16 1510 96 %     Weight 04/21/16 1510 185 lb (83.9 kg)     Height 04/21/16 1510 6\' 3"  (1.905 m)     Head Circumference --       Peak Flow --      Pain Score 04/21/16 1511 10     Pain Loc --      Pain Edu? --      Excl. in [place]? --     Constitutional: Alert and oriented. Well appearing and in no acute distress. Eyes: Conjunctivae are normal. PERRL. EOMI. Head: Atraumatic. Nose: No congestion/rhinnorhea. Mouth/Throat: Mucous membranes are moist. Neck: No stridor.  No meningeal signs.  No cervical spine tenderness to palpation. Cardiovascular: Normal rate, regular rhythm. Good peripheral circulation. Grossly normal heart sounds. He has a small bruise on the right lateral ribs at approximately the T4 level and that ecchymosis and surrounding area is extremely tender to palpation.  There are no other tender areas on his chest wall that I appreciated with palpation and he denies any other painful areas.  He also has a palpable thrill in his AV fistula in the right upper extremity. Respiratory: Normal respiratory effort.  No retractions. Lungs CTAB. Gastrointestinal: Soft and nontender. No distention.  Musculoskeletal: No lower extremity tenderness nor edema. No gross deformities of extremities.  Highly reproducible chest wall tenderness and small ecchymosis as right above. Neurologic:  Normal speech and language. No gross focal neurologic deficits are appreciated.  Skin:  Skin is warm, dry and intact. No rash noted.   ____________________________________________   LABS (all labs ordered are listed, but only abnormal results are displayed)  Labs Reviewed  BASIC METABOLIC PANEL - Abnormal; Notable for the following:       Result Value   Sodium 134 (*)    Chloride 97 (*)    Glucose, Bld 102 (*)    Creatinine, Ser 6.66 (*)    Calcium 8.4 (*)    GFR calc non Af Amer 8 (*)    GFR calc Af Amer 10 (*)    All other components within normal limits  CBC - Abnormal; Notable for the following:    RBC 2.99 (*)    Hemoglobin 9.1 (*)    HCT 27.8 (*)    RDW 20.0 (*)    All other components within normal limits  HEPATIC  FUNCTION PANEL - Abnormal; Notable for the following:    Albumin 3.1 (*)    AST 64 (*)    Alkaline Phosphatase 141 (*)    All other  components within normal limits  URINALYSIS, COMPLETE (UACMP) WITH MICROSCOPIC  CBG MONITORING, ED   ____________________________________________  EKG  ED ECG REPORT I, Jaquelyn Sakamoto, the attending physician, personally viewed and interpreted this ECG.  Date: 04/21/2016 EKG Time: 15:13 Rate: 82 Rhythm: sinus rhythm with nonspecific intraventricular conduction delay QRS Axis: normal Intervals: normal ST/T Wave abnormalities: Non-specific ST segment / T-wave changes, but no evidence of acute ischemia. there are T-wave inversions in leads V4, V5, and V6. Conduction Disturbances: none Narrative Interpretation: significant change from EKG obtained 6 days ago with no evidence of acute ischemia.  ____________________________________________  RADIOLOGY   Dg Ribs Unilateral W/chest Right  Result Date: 04/21/2016 CLINICAL DATA:  Syncope during dialysis, slipped in shower this morning and fell striking RIGHT lateral ribs, history hypertension, coronary disease, cirrhosis, end- stage renal disease alcohol and drug abuse EXAM: RIGHT RIBS AND CHEST - 3+ VIEW COMPARISON:  Chest radiograph 03/20/2016 FINDINGS: Enlargement of cardiac silhouette post CABG with pulmonary vascular congestion Atherosclerotic calcification aorta. Minimal chronic accentuation of interstitial markings, stable. No acute failure or consolidation. No pleural effusion or pneumothorax. Osseous mineralization grossly normal. BB placed at site of symptoms lower lateral RIGHT chest. Nondisplaced fractures of the lateral RIGHT ninth tenth ribs identified. IMPRESSION: Enlargement of cardiac silhouette with pulmonary vascular congestion post CABG. No acute RIGHT infiltrate or pneumothorax. Nondisplaced fractures lateral RIGHT ninth and tenth ribs. Electronically Signed   By: Lavonia Dana M.D.   On:  04/21/2016 15:56    ____________________________________________   PROCEDURES  Procedure(s) performed:   Procedures   Critical Care performed: No ____________________________________________   INITIAL IMPRESSION / ASSESSMENT AND PLAN / ED COURSE  Pertinent labs & imaging results that were available during my care of the patient were reviewed by me and considered in my medical decision making (see chart for details).  His vital signs are stable particularly for someone who had dialysis today (he is not hypotensive) and his EKG is unchanged from prior.  We are awaiting some basic lab work and I ordered radiographs of his chest and specifically of his ribs.  Giving him some pain medicine and we will reassess after the results are back.  No indication for intracranial imaging and he has no other chest pain or shortness of breath at this time.  He has had chronically elevated troponins in the past and he is having no pain except for the point tenderness of the chest wall so there is no indication for troponin at this time either.      ____________________________________________  FINAL CLINICAL IMPRESSION(S) / ED DIAGNOSES  Final diagnoses:  Fall, initial encounter  Closed fracture of multiple ribs of right side, initial encounter  Chest wall contusion, right, initial encounter     MEDICATIONS GIVEN DURING THIS VISIT:  Medications  oxyCODONE-acetaminophen (PERCOCET/ROXICET) 5-325 MG per tablet 2 tablet (not administered)  morphine 4 MG/ML injection 4 mg (4 mg Intravenous Given 04/21/16 1549)  ondansetron (ZOFRAN) injection 4 mg (4 mg Intravenous Given 04/21/16 1549)  diphenhydrAMINE (BENADRYL) injection 12.5 mg (12.5 mg Intravenous Given 04/21/16 1557)     NEW OUTPATIENT MEDICATIONS STARTED DURING THIS VISIT:  New Prescriptions   OXYCODONE-ACETAMINOPHEN (ROXICET) 5-325 MG TABLET    Take 1-2 tablets by mouth every 4 (four) hours as needed for severe pain.    Modified  Medications   No medications on file    Discontinued Medications   No medications on file     Note:  This document was prepared using Dragon voice  recognition software and may include unintentional dictation errors.    Hinda Kehr, MD 04/21/16 [phone]

## 2016-04-21 NOTE — ED Notes (Signed)
Patient urged to void

## 2016-04-21 NOTE — ED Notes (Signed)
ED Provider at bedside. 

## 2016-04-21 NOTE — Telephone Encounter (Signed)
Opened encounter in error  

## 2016-04-21 NOTE — ED Notes (Signed)
Patient returned from X-ray 

## 2016-04-21 NOTE — ED Triage Notes (Signed)
Pt comes into the ED via EMS from dialysis with c/o syncope during dialysis .Marland Kitchen Pt states he slipped in the shower this morning and fell hurting his right lateral ribs. Pt is a/ox4 on arrival, states normally has dialysis Tu,Th,saturday, last was on Tuesday.. Denies HA, dizziness or any other injury from the fall this morning.

## 2016-04-21 NOTE — ED Notes (Signed)
Pr c/o itching dt Morphine 4mg  IV  admin, seeking order for 12.5mg  IV Benadryl from MD.

## 2016-04-22 ENCOUNTER — Encounter: Payer: Self-pay | Admitting: *Deleted

## 2016-04-22 NOTE — Telephone Encounter (Signed)
No answer/No voicemail set up. Will mail letter to patient regarding missed appointment.

## 2016-04-23 DIAGNOSIS — N2581 Secondary hyperparathyroidism of renal origin: Secondary | ICD-10-CM | POA: Diagnosis not present

## 2016-04-23 DIAGNOSIS — N186 End stage renal disease: Secondary | ICD-10-CM | POA: Diagnosis not present

## 2016-04-23 DIAGNOSIS — D631 Anemia in chronic kidney disease: Secondary | ICD-10-CM | POA: Diagnosis not present

## 2016-04-23 DIAGNOSIS — D509 Iron deficiency anemia, unspecified: Secondary | ICD-10-CM | POA: Diagnosis not present

## 2016-04-23 DIAGNOSIS — Z992 Dependence on renal dialysis: Secondary | ICD-10-CM | POA: Diagnosis not present

## 2016-04-25 ENCOUNTER — Telehealth: Payer: Self-pay | Admitting: Cardiology

## 2016-04-25 NOTE — Telephone Encounter (Signed)
Pt would like to r/s stress test. Please call.

## 2016-04-25 NOTE — Telephone Encounter (Signed)
No answer/No voicemail has been set up

## 2016-04-26 ENCOUNTER — Ambulatory Visit: Payer: Medicare Other | Admitting: Cardiology

## 2016-04-26 DIAGNOSIS — N2581 Secondary hyperparathyroidism of renal origin: Secondary | ICD-10-CM | POA: Diagnosis not present

## 2016-04-26 DIAGNOSIS — N186 End stage renal disease: Secondary | ICD-10-CM | POA: Diagnosis not present

## 2016-04-26 DIAGNOSIS — D631 Anemia in chronic kidney disease: Secondary | ICD-10-CM | POA: Diagnosis not present

## 2016-04-26 DIAGNOSIS — Z992 Dependence on renal dialysis: Secondary | ICD-10-CM | POA: Diagnosis not present

## 2016-04-26 DIAGNOSIS — D509 Iron deficiency anemia, unspecified: Secondary | ICD-10-CM | POA: Diagnosis not present

## 2016-04-26 NOTE — Telephone Encounter (Signed)
Spoke with patient and rescheduled stress test for 05/05/16 at 08:00AM and instructed patient to arrive at 07:45Am to register. Also reviewed all instructions for testing and patient verbalized understanding with no further questions at this time.

## 2016-04-26 NOTE — Telephone Encounter (Signed)
NO answer. Says "Voicemail box has not been set up yet."

## 2016-04-28 DIAGNOSIS — D631 Anemia in chronic kidney disease: Secondary | ICD-10-CM | POA: Diagnosis not present

## 2016-04-28 DIAGNOSIS — N2581 Secondary hyperparathyroidism of renal origin: Secondary | ICD-10-CM | POA: Diagnosis not present

## 2016-04-28 DIAGNOSIS — N186 End stage renal disease: Secondary | ICD-10-CM | POA: Diagnosis not present

## 2016-04-28 DIAGNOSIS — D509 Iron deficiency anemia, unspecified: Secondary | ICD-10-CM | POA: Diagnosis not present

## 2016-04-28 DIAGNOSIS — Z992 Dependence on renal dialysis: Secondary | ICD-10-CM | POA: Diagnosis not present

## 2016-04-30 DIAGNOSIS — N186 End stage renal disease: Secondary | ICD-10-CM | POA: Diagnosis not present

## 2016-04-30 DIAGNOSIS — D509 Iron deficiency anemia, unspecified: Secondary | ICD-10-CM | POA: Diagnosis not present

## 2016-04-30 DIAGNOSIS — Z992 Dependence on renal dialysis: Secondary | ICD-10-CM | POA: Diagnosis not present

## 2016-04-30 DIAGNOSIS — D631 Anemia in chronic kidney disease: Secondary | ICD-10-CM | POA: Diagnosis not present

## 2016-04-30 DIAGNOSIS — N2581 Secondary hyperparathyroidism of renal origin: Secondary | ICD-10-CM | POA: Diagnosis not present

## 2016-05-02 DIAGNOSIS — S2249XD Multiple fractures of ribs, unspecified side, subsequent encounter for fracture with routine healing: Secondary | ICD-10-CM | POA: Diagnosis not present

## 2016-05-03 DIAGNOSIS — N2581 Secondary hyperparathyroidism of renal origin: Secondary | ICD-10-CM | POA: Diagnosis not present

## 2016-05-03 DIAGNOSIS — Z992 Dependence on renal dialysis: Secondary | ICD-10-CM | POA: Diagnosis not present

## 2016-05-03 DIAGNOSIS — N186 End stage renal disease: Secondary | ICD-10-CM | POA: Diagnosis not present

## 2016-05-03 DIAGNOSIS — D509 Iron deficiency anemia, unspecified: Secondary | ICD-10-CM | POA: Diagnosis not present

## 2016-05-03 DIAGNOSIS — D631 Anemia in chronic kidney disease: Secondary | ICD-10-CM | POA: Diagnosis not present

## 2016-05-04 ENCOUNTER — Telehealth: Payer: Self-pay | Admitting: Cardiology

## 2016-05-04 NOTE — Telephone Encounter (Signed)
Pt would like to r/s his stress test from this morning. States he has cracked ribs, and is unable to make it.

## 2016-05-04 NOTE — Telephone Encounter (Signed)
Patient wanted to reschedule stress test to next week. Rescheduled to 05/10/16 at 08:00AM and reviewed instructions for testing. He verbalized understanding with no further questions at this time.

## 2016-05-05 ENCOUNTER — Emergency Department
Admission: EM | Admit: 2016-05-05 | Discharge: 2016-05-05 | Payer: Medicare Other | Attending: Emergency Medicine | Admitting: Emergency Medicine

## 2016-05-05 ENCOUNTER — Encounter: Payer: Self-pay | Admitting: Emergency Medicine

## 2016-05-05 ENCOUNTER — Encounter (HOSPITAL_COMMUNITY): Payer: Self-pay | Admitting: Family Medicine

## 2016-05-05 ENCOUNTER — Other Ambulatory Visit: Payer: Self-pay | Admitting: Internal Medicine

## 2016-05-05 ENCOUNTER — Inpatient Hospital Stay (HOSPITAL_COMMUNITY)
Admission: AD | Admit: 2016-05-05 | Discharge: 2016-05-10 | DRG: 344 | Disposition: A | Discharge status: Home / Self Care | Payer: Medicare Other | Source: Other Acute Inpatient Hospital | Attending: Internal Medicine | Admitting: Internal Medicine

## 2016-05-05 ENCOUNTER — Ambulatory Visit: Payer: Medicare Other

## 2016-05-05 DIAGNOSIS — E1122 Type 2 diabetes mellitus with diabetic chronic kidney disease: Secondary | ICD-10-CM | POA: Insufficient documentation

## 2016-05-05 DIAGNOSIS — I10 Essential (primary) hypertension: Secondary | ICD-10-CM | POA: Diagnosis present

## 2016-05-05 DIAGNOSIS — N185 Chronic kidney disease, stage 5: Secondary | ICD-10-CM | POA: Diagnosis not present

## 2016-05-05 DIAGNOSIS — I2510 Atherosclerotic heart disease of native coronary artery without angina pectoris: Secondary | ICD-10-CM | POA: Diagnosis present

## 2016-05-05 DIAGNOSIS — K5521 Angiodysplasia of colon with hemorrhage: Principal | ICD-10-CM | POA: Diagnosis present

## 2016-05-05 DIAGNOSIS — Z951 Presence of aortocoronary bypass graft: Secondary | ICD-10-CM

## 2016-05-05 DIAGNOSIS — E1143 Type 2 diabetes mellitus with diabetic autonomic (poly)neuropathy: Secondary | ICD-10-CM | POA: Diagnosis not present

## 2016-05-05 DIAGNOSIS — K5520 Angiodysplasia of colon without hemorrhage: Secondary | ICD-10-CM

## 2016-05-05 DIAGNOSIS — R188 Other ascites: Secondary | ICD-10-CM

## 2016-05-05 DIAGNOSIS — Z79899 Other long term (current) drug therapy: Secondary | ICD-10-CM | POA: Insufficient documentation

## 2016-05-05 DIAGNOSIS — K766 Portal hypertension: Secondary | ICD-10-CM | POA: Diagnosis present

## 2016-05-05 DIAGNOSIS — Z992 Dependence on renal dialysis: Secondary | ICD-10-CM | POA: Diagnosis not present

## 2016-05-05 DIAGNOSIS — I120 Hypertensive chronic kidney disease with stage 5 chronic kidney disease or end stage renal disease: Secondary | ICD-10-CM | POA: Insufficient documentation

## 2016-05-05 DIAGNOSIS — R109 Unspecified abdominal pain: Secondary | ICD-10-CM | POA: Diagnosis present

## 2016-05-05 DIAGNOSIS — E1151 Type 2 diabetes mellitus with diabetic peripheral angiopathy without gangrene: Secondary | ICD-10-CM | POA: Diagnosis present

## 2016-05-05 DIAGNOSIS — E1142 Type 2 diabetes mellitus with diabetic polyneuropathy: Secondary | ICD-10-CM | POA: Diagnosis present

## 2016-05-05 DIAGNOSIS — K3184 Gastroparesis: Secondary | ICD-10-CM | POA: Diagnosis present

## 2016-05-05 DIAGNOSIS — E785 Hyperlipidemia, unspecified: Secondary | ICD-10-CM | POA: Diagnosis present

## 2016-05-05 DIAGNOSIS — N186 End stage renal disease: Secondary | ICD-10-CM | POA: Insufficient documentation

## 2016-05-05 DIAGNOSIS — R040 Epistaxis: Secondary | ICD-10-CM | POA: Diagnosis not present

## 2016-05-05 DIAGNOSIS — D631 Anemia in chronic kidney disease: Secondary | ICD-10-CM | POA: Diagnosis present

## 2016-05-05 DIAGNOSIS — K2971 Gastritis, unspecified, with bleeding: Secondary | ICD-10-CM | POA: Diagnosis not present

## 2016-05-05 DIAGNOSIS — K7031 Alcoholic cirrhosis of liver with ascites: Secondary | ICD-10-CM | POA: Insufficient documentation

## 2016-05-05 DIAGNOSIS — K922 Gastrointestinal hemorrhage, unspecified: Secondary | ICD-10-CM | POA: Diagnosis not present

## 2016-05-05 DIAGNOSIS — B3781 Candidal esophagitis: Secondary | ICD-10-CM | POA: Diagnosis not present

## 2016-05-05 DIAGNOSIS — K2960 Other gastritis without bleeding: Secondary | ICD-10-CM | POA: Diagnosis present

## 2016-05-05 DIAGNOSIS — N2581 Secondary hyperparathyroidism of renal origin: Secondary | ICD-10-CM | POA: Diagnosis not present

## 2016-05-05 DIAGNOSIS — E875 Hyperkalemia: Secondary | ICD-10-CM | POA: Diagnosis not present

## 2016-05-05 DIAGNOSIS — K2951 Unspecified chronic gastritis with bleeding: Secondary | ICD-10-CM

## 2016-05-05 DIAGNOSIS — I252 Old myocardial infarction: Secondary | ICD-10-CM

## 2016-05-05 DIAGNOSIS — D62 Acute posthemorrhagic anemia: Secondary | ICD-10-CM | POA: Diagnosis not present

## 2016-05-05 DIAGNOSIS — R1084 Generalized abdominal pain: Secondary | ICD-10-CM | POA: Diagnosis not present

## 2016-05-05 DIAGNOSIS — K2970 Gastritis, unspecified, without bleeding: Secondary | ICD-10-CM | POA: Diagnosis present

## 2016-05-05 DIAGNOSIS — K625 Hemorrhage of anus and rectum: Secondary | ICD-10-CM | POA: Diagnosis not present

## 2016-05-05 DIAGNOSIS — Z800 Family history of malignant neoplasm of digestive organs: Secondary | ICD-10-CM | POA: Diagnosis not present

## 2016-05-05 DIAGNOSIS — K219 Gastro-esophageal reflux disease without esophagitis: Secondary | ICD-10-CM | POA: Diagnosis present

## 2016-05-05 DIAGNOSIS — D500 Iron deficiency anemia secondary to blood loss (chronic): Secondary | ICD-10-CM | POA: Diagnosis not present

## 2016-05-05 DIAGNOSIS — E871 Hypo-osmolality and hyponatremia: Secondary | ICD-10-CM | POA: Diagnosis present

## 2016-05-05 DIAGNOSIS — K921 Melena: Secondary | ICD-10-CM | POA: Diagnosis not present

## 2016-05-05 DIAGNOSIS — K7460 Unspecified cirrhosis of liver: Secondary | ICD-10-CM

## 2016-05-05 DIAGNOSIS — I25810 Atherosclerosis of coronary artery bypass graft(s) without angina pectoris: Secondary | ICD-10-CM | POA: Diagnosis not present

## 2016-05-05 DIAGNOSIS — J45909 Unspecified asthma, uncomplicated: Secondary | ICD-10-CM | POA: Diagnosis present

## 2016-05-05 DIAGNOSIS — E876 Hypokalemia: Secondary | ICD-10-CM | POA: Diagnosis present

## 2016-05-05 DIAGNOSIS — K31811 Angiodysplasia of stomach and duodenum with bleeding: Secondary | ICD-10-CM | POA: Diagnosis not present

## 2016-05-05 DIAGNOSIS — F17210 Nicotine dependence, cigarettes, uncomplicated: Secondary | ICD-10-CM | POA: Diagnosis not present

## 2016-05-05 DIAGNOSIS — F1011 Alcohol abuse, in remission: Secondary | ICD-10-CM | POA: Diagnosis present

## 2016-05-05 DIAGNOSIS — T182XXA Foreign body in stomach, initial encounter: Secondary | ICD-10-CM | POA: Diagnosis not present

## 2016-05-05 DIAGNOSIS — D509 Iron deficiency anemia, unspecified: Secondary | ICD-10-CM | POA: Diagnosis not present

## 2016-05-05 LAB — BODY FLUID CELL COUNT WITH DIFFERENTIAL
EOS FL: 0 %
Lymphs, Fluid: 10 %
MONOCYTE-MACROPHAGE-SEROUS FLUID: 89 %
Neutrophil Count, Fluid: 1 %
OTHER CELLS FL: 0 %
Total Nucleated Cell Count, Fluid: 427 cu mm

## 2016-05-05 LAB — CBC
HEMATOCRIT: 25.7 % — AB (ref 40.0–52.0)
Hemoglobin: 8.2 g/dL — ABNORMAL LOW (ref 13.0–18.0)
MCH: 28.5 pg (ref 26.0–34.0)
MCHC: 31.9 g/dL — AB (ref 32.0–36.0)
MCV: 89.3 fL (ref 80.0–100.0)
Platelets: 279 10*3/uL (ref 150–440)
RBC: 2.87 MIL/uL — ABNORMAL LOW (ref 4.40–5.90)
RDW: 18.4 % — AB (ref 11.5–14.5)
WBC: 7.3 10*3/uL (ref 3.8–10.6)

## 2016-05-05 LAB — COMPREHENSIVE METABOLIC PANEL
ALBUMIN: 3.5 g/dL (ref 3.5–5.0)
ALK PHOS: 166 U/L — AB (ref 38–126)
ALT: 24 U/L (ref 17–63)
AST: 26 U/L (ref 15–41)
Anion gap: 12 (ref 5–15)
BUN: 33 mg/dL — AB (ref 6–20)
CALCIUM: 9.1 mg/dL (ref 8.9–10.3)
CO2: 28 mmol/L (ref 22–32)
Chloride: 93 mmol/L — ABNORMAL LOW (ref 101–111)
Creatinine, Ser: 5.3 mg/dL — ABNORMAL HIGH (ref 0.61–1.24)
GFR calc Af Amer: 13 mL/min — ABNORMAL LOW (ref >60)
GFR calc non Af Amer: 11 mL/min — ABNORMAL LOW (ref >60)
GLUCOSE: 140 mg/dL — AB (ref 65–99)
POTASSIUM: 3.1 mmol/L — AB (ref 3.5–5.1)
SODIUM: 133 mmol/L — AB (ref 135–145)
Total Bilirubin: 1.5 mg/dL — ABNORMAL HIGH (ref 0.3–1.2)
Total Protein: 7.3 g/dL (ref 6.5–8.1)

## 2016-05-05 LAB — PROTIME-INR
INR: 1.13
Prothrombin Time: 14.6 seconds (ref 11.4–15.2)

## 2016-05-05 LAB — GLUCOSE, CAPILLARY: GLUCOSE-CAPILLARY: 84 mg/dL (ref 65–99)

## 2016-05-05 MED ORDER — GABAPENTIN 300 MG PO CAPS
300.0000 mg | ORAL_CAPSULE | Freq: Every day | ORAL | Status: DC
  Administered 2016-05-06 – 2016-05-09 (×5): 300 mg via ORAL
  Filled 2016-05-05 (×5): qty 1

## 2016-05-05 MED ORDER — HYDROXYZINE PAMOATE 25 MG PO CAPS
25.0000 mg | ORAL_CAPSULE | Freq: Three times a day (TID) | ORAL | Status: DC | PRN
  Filled 2016-05-05: qty 1

## 2016-05-05 MED ORDER — OXYCODONE-ACETAMINOPHEN 5-325 MG PO TABS: 1.0000 | ORAL_TABLET | ORAL | Status: DC | PRN

## 2016-05-05 MED ORDER — SODIUM CHLORIDE 0.9 % IV SOLN
250.0000 mL | INTRAVENOUS | Status: DC | PRN
  Administered 2016-05-10: 14:00:00 via INTRAVENOUS

## 2016-05-05 MED ORDER — LABETALOL HCL 100 MG PO TABS
100.0000 mg | ORAL_TABLET | Freq: Every day | ORAL | Status: DC
  Administered 2016-05-06 – 2016-05-10 (×5): 100 mg via ORAL
  Filled 2016-05-05 (×6): qty 1

## 2016-05-05 MED ORDER — ACETAMINOPHEN 325 MG PO TABS: 650.0000 mg | ORAL_TABLET | Freq: Four times a day (QID) | ORAL | Status: DC | PRN

## 2016-05-05 MED ORDER — SODIUM CHLORIDE 0.9 % IV SOLN
80.0000 mg | Freq: Once | INTRAVENOUS | Status: AC
  Administered 2016-05-05: 21:00:00 80 mg via INTRAVENOUS
  Filled 2016-05-05: qty 80

## 2016-05-05 MED ORDER — CINACALCET HCL 30 MG PO TABS
30.0000 mg | ORAL_TABLET | Freq: Every day | ORAL | Status: DC
Start: 2016-05-06 — End: 2016-05-10
  Administered 2016-05-07 – 2016-05-10 (×2): 30 mg via ORAL
  Filled 2016-05-05 (×4): qty 1

## 2016-05-05 MED ORDER — DEXTROSE 5 % IV SOLN: 1.0000 g | Freq: Once | INTRAVENOUS | Status: DC

## 2016-05-05 MED ORDER — MORPHINE SULFATE (PF) 2 MG/ML IV SOLN: 2.0000 mg | INTRAVENOUS | Status: DC | PRN

## 2016-05-05 MED ORDER — RENA-VITE PO TABS
1.0000 | ORAL_TABLET | Freq: Every day | ORAL | Status: DC
  Administered 2016-05-06 – 2016-05-09 (×4): 1 via ORAL
  Filled 2016-05-05 (×5): qty 1

## 2016-05-05 MED ORDER — OXYCODONE-ACETAMINOPHEN 5-325 MG PO TABS
1.0000 | ORAL_TABLET | ORAL | Status: DC | PRN
  Administered 2016-05-06 – 2016-05-10 (×20): 2 via ORAL
  Administered 2016-05-10: 1 via ORAL
  Administered 2016-05-10: 2 via ORAL
  Filled 2016-05-05 (×20): qty 2

## 2016-05-05 MED ORDER — CEFTRIAXONE SODIUM-DEXTROSE 1-3.74 GM-% IV SOLR
1.0000 g | Freq: Once | INTRAVENOUS | Status: AC
  Administered 2016-05-05: 1 g via INTRAVENOUS
  Filled 2016-05-05: qty 50

## 2016-05-05 MED ORDER — SODIUM CHLORIDE 0.9% FLUSH: 3.0000 mL | INTRAVENOUS | Status: DC | PRN

## 2016-05-05 MED ORDER — MORPHINE SULFATE (PF) 4 MG/ML IV SOLN
4.0000 mg | Freq: Once | INTRAVENOUS | Status: AC
  Administered 2016-05-05: 4 mg via INTRAVENOUS
  Filled 2016-05-05: qty 1

## 2016-05-05 MED ORDER — SODIUM CHLORIDE 0.9% FLUSH
3.0000 mL | Freq: Two times a day (BID) | INTRAVENOUS | Status: DC
  Administered 2016-05-06 – 2016-05-09 (×4): 3 mL via INTRAVENOUS

## 2016-05-05 MED ORDER — CALCIUM ACETATE (PHOS BINDER) 667 MG PO CAPS
2001.0000 mg | ORAL_CAPSULE | Freq: Three times a day (TID) | ORAL | Status: DC
  Administered 2016-05-06 – 2016-05-10 (×8): 2001 mg via ORAL
  Filled 2016-05-05 (×9): qty 3

## 2016-05-05 MED ORDER — ACETAMINOPHEN 650 MG RE SUPP: 650.0000 mg | Freq: Four times a day (QID) | RECTAL | Status: DC | PRN

## 2016-05-05 MED ORDER — PANTOPRAZOLE SODIUM 40 MG IV SOLR
40.0000 mg | Freq: Two times a day (BID) | INTRAVENOUS | Status: DC
  Administered 2016-05-06 – 2016-05-09 (×9): 40 mg via INTRAVENOUS
  Filled 2016-05-05 (×10): qty 40

## 2016-05-05 MED ORDER — AMLODIPINE BESYLATE 5 MG PO TABS
5.0000 mg | ORAL_TABLET | Freq: Every day | ORAL | Status: DC
  Administered 2016-05-06 – 2016-05-10 (×5): 5 mg via ORAL
  Filled 2016-05-05 (×6): qty 1

## 2016-05-05 MED ORDER — LIDOCAINE HCL (PF) 1 % IJ SOLN
5.0000 mL | Freq: Once | INTRAMUSCULAR | Status: DC
  Filled 2016-05-05: qty 5

## 2016-05-05 MED ORDER — ONDANSETRON HCL 4 MG/2ML IJ SOLN: 4.0000 mg | Freq: Four times a day (QID) | INTRAMUSCULAR | Status: DC | PRN

## 2016-05-05 MED ORDER — PANTOPRAZOLE SODIUM 40 MG IV SOLR: 40.0000 mg | Freq: Two times a day (BID) | INTRAVENOUS | Status: DC

## 2016-05-05 MED ORDER — ONDANSETRON HCL 4 MG PO TABS: 4.0000 mg | ORAL_TABLET | Freq: Four times a day (QID) | ORAL | Status: DC | PRN

## 2016-05-05 MED ORDER — SODIUM CHLORIDE 0.9% FLUSH
3.0000 mL | Freq: Two times a day (BID) | INTRAVENOUS | Status: DC
  Administered 2016-05-06 – 2016-05-08 (×4): 3 mL via INTRAVENOUS

## 2016-05-05 MED ORDER — MORPHINE SULFATE (PF) 2 MG/ML IV SOLN
2.0000 mg | INTRAVENOUS | Status: DC | PRN
  Administered 2016-05-07: 2 mg via INTRAVENOUS
  Filled 2016-05-05: qty 1

## 2016-05-05 MED ORDER — SODIUM CHLORIDE 0.9 % IV SOLN
8.0000 mg/h | INTRAVENOUS | Status: DC
  Filled 2016-05-05: qty 80

## 2016-05-05 NOTE — ED Notes (Signed)
Paracentesis consent obtained.

## 2016-05-05 NOTE — ED Notes (Signed)
ED Provider at bedside. 

## 2016-05-05 NOTE — ED Notes (Signed)
Pharmacy called to send meds due.

## 2016-05-05 NOTE — H&P (Signed)
History and Physical    Stanley [name] YPP:[id number] DOB: 20-May-1960 DOA: 05/05/2016  PCP: Elyse Jarvis, MD   Patient coming from: Home, by way of [facility]   Chief Complaint: Melena, abdominal pain   HPI: Stanley [name] is a 56 y.o. male with medical history significant for remote alcohol abuse, cirrhosis with ascites, coronary artery disease, end-stage renal disease on hemodialysis, and recurrent GI bleed who presents to the ED in [place] with 2 days of melena and abdominal pain. Patient reports that he was in his usual state of health until the insidious development of mild to moderate generalized abdominal pain approximately 2 days ago which she describes as sharp in character, waxing and waning, with no alleviating or exacerbating factors identified, and similar to prior abdominal pain. Patient is also noted dark tarry stools for the past 2 days, but denies any bright red blood. He denies any nausea, vomiting, or diarrhea. He denies any recent fevers or chills and denies cough or dyspnea. He reports completing his dialysis today without incident. He reports adherence with his daily Protonix.   [place] ED Course: Upon arrival to the [facility] ED, patient is found to be afebrile, saturating well on room air, and with vital signs stable. EKG features a normal sinus rhythm with LVH by voltage criteria and repolarization abnormality. Chemistry panels notable for mild hyponatremia and hypokalemia, as well as a mild elevation in total bilirubin. CBC features a normocytic anemia with hemoglobin 8.2, down from 9.1 two weeks earlier. DRE was performed and reveals grossly melanotic stool. Diagnostic paracentesis was performed by the ED physician with only 4 neutrophils per cubic millimeter. Patient was given a dose of Rocephin and IV Protonix in the knee. There was no gastroenterology coverage and the ED physician consulted with GI here at [facility]. Patient has been admitted to  the telemetry unit at [facility] for ongoing evaluation and management of acute on chronic anemia with recurrent melena.   Review of Systems:  All other systems reviewed and apart from HPI, are negative.  Past Medical History:  Diagnosis Date  . Alcohol abuse   . Cirrhosis ([place])   . Coronary artery disease 2009  . Diabetic peripheral neuropathy ([place])   . Drug abuse   . End stage renal disease on dialysis [facility]) NEPHROLOGIST-   [facility]  IN [place]   HEMODIALYSIS --   TUES/  THURS/  SAT  . GERD (gastroesophageal reflux disease)   . Hyperlipidemia   . Hypertension   . PAD (peripheral artery disease) ([place])   . Suicidal ideation    & HOMICIDAL IDEATION --  06-16-2013   ADMITTED TO BEHAVIOR HEALTH    Past Surgical History:  Procedure Laterality Date  . CORONARY ANGIOPLASTY  ?   PT UNABLE TO TELL IF  BEFORE OR AFTER  CABG  . CORONARY ARTERY BYPASS GRAFT  2008  ([place] , [place])   3 VESSEL  . DIALYSIS FISTULA CREATION  LAST SURGERY  APPOX  2008  . ESOPHAGOGASTRODUODENOSCOPY N/A 05/07/2015   Procedure: ESOPHAGOGASTRODUODENOSCOPY (EGD);  Surgeon: Hulen Luster, MD;  Location: [facility] ENDOSCOPY;  Service: Endoscopy;  Laterality: N/A;  . ESOPHAGOGASTRODUODENOSCOPY (EGD) WITH PROPOFOL N/A 05/17/2015   Procedure: ESOPHAGOGASTRODUODENOSCOPY (EGD) WITH PROPOFOL;  Surgeon: [name], MD;  Location: [facility] ENDOSCOPY;  Service: Endoscopy;  Laterality: N/A;  . ESOPHAGOGASTRODUODENOSCOPY (EGD) WITH PROPOFOL N/A 01/20/2016   Procedure: ESOPHAGOGASTRODUODENOSCOPY (EGD) WITH PROPOFOL;  Surgeon: [name], MD;  Location: [facility] ENDOSCOPY;  Service: Endoscopy;  Laterality: N/A;  . ESOPHAGOGASTRODUODENOSCOPY (EGD) WITH PROPOFOL N/A 04/17/2016   Procedure: ESOPHAGOGASTRODUODENOSCOPY (EGD) WITH PROPOFOL;  Surgeon: Lin Landsman, MD;  Location: [facility] ENDOSCOPY;  Service: Gastroenterology;  Laterality: N/A;  . MANDIBULAR HARDWARE REMOVAL N/A 07/29/2013   Procedure: REMOVAL OF ARCH BARS;  Surgeon: Theodoro Kos, DO;  Location: [place];  Service: Plastics;  Laterality: N/A;  . ORIF MANDIBULAR FRACTURE N/A 06/05/2013   Procedure: REPAIR OF MANDIBULAR FRACTURE x 2 with maxillo-mandibular fixation ;  Surgeon: Theodoro Kos, DO;  Location: [place];  Service: Plastics;  Laterality: N/A;  . PERIPHERAL ARTERIAL STENT GRAFT Left      reports that he has been smoking Cigarettes.  He has a 6.00 pack-year smoking history. He has never used smokeless tobacco. He reports that he uses drugs, including Marijuana, about 7 times per week. He reports that he does not drink alcohol.  No Known Allergies  Family History  Problem Relation Age of Onset  . Colon cancer Mother   . Cancer Father   . Cancer Sister      Prior to Admission medications   Medication Sig Start Date End Date Taking? Authorizing Provider  amLODipine (NORVASC) 5 MG tablet Take 5 mg by mouth daily.    Historical Provider, MD  b complex-vitamin c-folic acid (NEPHRO-VITE) 0.8 MG TABS tablet Take 1 tablet by mouth daily.    Historical Provider, MD  calcium acetate (PHOSLO) 667 MG capsule Take 2,001 mg by mouth 3 (three) times daily with meals.    Historical Provider, MD  cinacalcet (SENSIPAR) 30 MG tablet Take 30 mg by mouth daily with supper.    Historical Provider, MD  furosemide (LASIX) 80 MG tablet Take 1 tablet by mouth daily.    Historical Provider, MD  gabapentin (NEURONTIN) 300 MG capsule Take 1 capsule (300 mg total) by mouth daily. Patient taking differently: Take 300 mg by mouth at bedtime.  03/27/15   Bettey Costa, MD  hydrOXYzine (VISTARIL) 25 MG capsule Take 1 capsule (25 mg total) by mouth 3 (three) times daily as needed for itching. 02/25/16   Jami L Hagler, PA-C  labetalol (NORMODYNE) 100 MG tablet Take 1 tablet (100 mg total) by mouth daily. 01/22/16   Epifanio Lesches, MD  nitroGLYCERIN (NITROSTAT) 0.4 MG SL tablet Place 1 tablet (0.4 mg total) under the tongue every 5 (five) minutes as needed. 04/12/16    Wende Bushy, MD  oxyCODONE-acetaminophen (ROXICET) 5-325 MG tablet Take 1-2 tablets by mouth every 4 (four) hours as needed for severe pain. 04/21/16   Hinda Kehr, MD  pantoprazole (PROTONIX) 40 MG tablet Take 1 tablet (40 mg total) by mouth 2 (two) times daily. Patient taking differently: Take 40 mg by mouth daily.  01/22/16   Epifanio Lesches, MD    Physical Exam: Vitals:   05/05/16 2225  BP: 129/76  Pulse: 72  Resp: 18  Temp: 98.3 F (36.8 C)  TempSrc: Oral  SpO2: 99%  Weight: 84 kg (185 lb 1.6 oz)  Height: 6\' 3"  (1.905 m)      Constitutional: NAD, calm, comfortable Eyes: PERTLA, lids and conjunctivae normal ENMT: Mucous membranes are moist. Posterior pharynx clear of any exudate or lesions.   Neck: normal, supple, no masses, no thyromegaly Respiratory: clear to auscultation bilaterally, no wheezing, no crackles. Normal respiratory effort.   Cardiovascular: S1 & S2 heard, regular rate and rhythm. Trace pretibial edema bilaterally. No significant JVD. AVF in proximal RUE.  Abdomen: Moderate distension, mild generalized tenderness without rebound  pain or guarding. Bowel sounds active.  Musculoskeletal: no clubbing / cyanosis. No joint deformity upper and lower extremities.    Skin: no significant rashes, lesions, ulcers. Warm, dry, well-perfused. Neurologic: CN 2-12 grossly intact. Sensation intact, DTR normal. Strength 5/5 in all 4 limbs.  Psychiatric: Normal judgment and insight. Alert and oriented x 3. Normal mood and affect.     Labs on Admission: I have personally reviewed following labs and imaging studies  CBC:  Recent Labs Lab 05/05/16 1813  WBC 7.3  HGB 8.2*  HCT 25.7*  MCV 89.3  PLT 099   Basic Metabolic Panel:  Recent Labs Lab 05/05/16 1813  NA 133*  K 3.1*  CL 93*  CO2 28  GLUCOSE 140*  BUN 33*  CREATININE 5.30*  CALCIUM 9.1   GFR: Estimated Creatinine Clearance: 18.5 mL/min (by C-G formula based on SCr of 5.3 mg/dL (H)). Liver  Function Tests:  Recent Labs Lab 05/05/16 1813  AST 26  ALT 24  ALKPHOS 166*  BILITOT 1.5*  PROT 7.3  ALBUMIN 3.5   No results for input(s): LIPASE, AMYLASE in the last 168 hours. No results for input(s): AMMONIA in the last 168 hours. Coagulation Profile:  Recent Labs Lab 05/05/16 1932  INR 1.13   Cardiac Enzymes: No results for input(s): CKTOTAL, CKMB, CKMBINDEX, TROPONINI in the last 168 hours. BNP (last 3 results) No results for input(s): PROBNP in the last 8760 hours. HbA1C: No results for input(s): HGBA1C in the last 72 hours. CBG:  Recent Labs Lab 05/05/16 2125  GLUCAP 84   Lipid Profile: No results for input(s): CHOL, HDL, LDLCALC, TRIG, CHOLHDL, LDLDIRECT in the last 72 hours. Thyroid Function Tests: No results for input(s): TSH, T4TOTAL, FREET4, T3FREE, THYROIDAB in the last 72 hours. Anemia Panel: No results for input(s): VITAMINB12, FOLATE, FERRITIN, TIBC, IRON, RETICCTPCT in the last 72 hours. Urine analysis:    Component Value Date/Time   COLORURINE YELLOW (A) 05/03/2015 0815   APPEARANCEUR CLEAR (A) 05/03/2015 0815   APPEARANCEUR Clear 11/13/2013 1314   LABSPEC 1.008 05/03/2015 0815   LABSPEC 1.014 11/13/2013 1314   PHURINE 9.0 (H) 05/03/2015 0815   GLUCOSEU NEGATIVE 05/03/2015 0815   GLUCOSEU 50 mg/dL 11/13/2013 1314   HGBUR NEGATIVE 05/03/2015 0815   BILIRUBINUR NEGATIVE 05/03/2015 0815   BILIRUBINUR Negative 11/13/2013 1314   KETONESUR NEGATIVE 05/03/2015 0815   PROTEINUR 100 (A) 05/03/2015 0815   UROBILINOGEN 0.2 06/22/2014 1230   NITRITE NEGATIVE 05/03/2015 0815   LEUKOCYTESUR NEGATIVE 05/03/2015 0815   LEUKOCYTESUR Negative 11/13/2013 1314   Sepsis Labs: @LABRCNTIP (procalcitonin:4,lacticidven:4) )No results found for this or any previous visit (from the past 240 hour(s)).   Radiological Exams on Admission: No results found.  EKG: Independently reviewed. Sinus rhythm, LVH by voltage criteria with repolarization  abnormality  Assessment/Plan  1. GI bleeding, normocytic anemia   - Pt has hx of recurrent GIB, follows with GI outpatient  - Presents with 2 days melena and vague generalized abdominal pain  - Had SB enteroscopy on 04/17/16 with normal esophagus, stomach, and duodenum; scope could not be advanced past jejunum  - He takes daily Protonix at home, no antiplatelet or anticoagulant  - Hgb is 8.2 on admission, down from 9.1 two weeks earlier  - DRE reveals grossly melanotic stool - GI is consulting and much appreciated, will follow-up on recommendations - Type and screen has been performed; continue Protonix 40 mg IV q12h; follow serial H&H   2. Cirrhosis with ascites  - Pt reports abstaining  from EtOH since he was diagnosed; hep B and C negative last March  - No varices noted on endoscopy, most recently 04/17/16  - Managed with Lasix and Protonix at home; Lasix held, Protonix given IV as above  - He has resistant ascites, requiring paracentesis q1-66mos; there is increased distension and pain and IR has been consulted for therapeutic paracentesis - Continue PPI as above, monitor electrolytes   3. ESRD - Dialyzes TTS via AVF in proximal RUE  - Pt reports completing HD without incident on 05/05/2016 and there are no urgent indications on admission  - Continue binders, renal diet with fluid-restrictions, renally-dose medications    4. GERD, hx gastritis  - Managed with daily Protonix at home  - Treating with Protonix 40 mg IV q12h as above    5. Hypertension  - BP at goal  - Continue Norvasc and labetalol as tolerated     DVT prophylaxis: SCD's  Code Status: Full  Family Communication: Discussed with patient Disposition Plan: Admit to telemetry Consults called: Gastroenterology  Admission status: Inpatient    Vianne Bulls, MD [facility] Pager [phone]  If 7PM-7AM, please contact night-coverage [email or web address] Password [email or web address]  05/05/2016, 11:10 PM

## 2016-05-05 NOTE — ED Notes (Signed)
Report given to [facility]. 

## 2016-05-05 NOTE — ED Notes (Signed)
Paracentesis specimen walked over to the lab by Fara Chute, Therapist, sports.  Labels would not print at bedside, white patient labels affixed to samples.

## 2016-05-05 NOTE — ED Provider Notes (Signed)
[facility] Emergency Department Provider Note  ____________________________________________  Time seen: Approximately 7:54 PM  I have reviewed the triage vital signs and the nursing notes.   HISTORY  Chief Complaint Rectal Bleeding   HPI Stanley [name] is a 56 y.o. male h/o ESRD on HD (TTS), cirrhosis c/b ascites, CAD, HTN, HLD, multiple GIB requiring transfusion from erosive gastritis and AVMs who presents for evaluation of melena. Patient has been recently hospitalized 20 days ago with a upper GI bleed. Had an endoscopy that was negative. Patient has had multiple endoscopies showing AVM and gastritis. No history of varices. No hematemesis or coffee-ground emesis. Patient reports that this episode of melena started yesterday and he has had 4 episodes since then. Also complaining of diffuse cramping, sharp, and moderate abdominal pain that started this morning. Patient has had distention in his abdomen. His last paracentesis was 3 weeks ago. Patient denies ever having similar pain. No prior history of SBP. Patient denies fever, nausea, vomiting, constipation, chest pain, URI symptoms. Patient is not on any blood thinners. Patient last received blood transfusion 20 days ago for upper GI bleed. Patient is also complaining that he had epistaxis earlier today and his right and air which has now resolved. No prior history of epistaxis.  Past Medical History:  Diagnosis Date  . Alcohol abuse   . Cirrhosis ([place])   . Coronary artery disease 2009  . Diabetic peripheral neuropathy ([place])   . Drug abuse   . End stage renal disease on dialysis [facility]) NEPHROLOGIST-   DR [facility]  IN [place]   HEMODIALYSIS --   TUES/  THURS/  SAT  . GERD (gastroesophageal reflux disease)   . Hyperlipidemia   . Hypertension   . PAD (peripheral artery disease) ([employer])   . Suicidal ideation    & HOMICIDAL IDEATION --  06-16-2013   ADMITTED TO BEHAVIOR HEALTH    Patient Active Problem List    Diagnosis Date Noted  . Acute posthemorrhagic anemia 04/17/2016  . Gastrointestinal bleed 04/17/2016  . History of esophagogastroduodenoscopy (EGD) 04/17/2016  . Elevated troponin 04/17/2016  . Alcohol abuse 04/17/2016  . Upper GI bleed 01/19/2016  . Blood in stool   . Angiodysplasia of stomach and duodenum with hemorrhage   . Gastritis   . Esophagitis, unspecified   . GI bleed 05/16/2015  . Acute GI bleeding   . Symptomatic anemia 04/30/2015  . HTN (hypertension) 04/06/2015  . GERD (gastroesophageal reflux disease) 04/06/2015  . HLD (hyperlipidemia) 04/06/2015  . Dyspnea 04/06/2015  . Ascites 04/06/2015  . GIB (gastrointestinal bleeding) 03/23/2015  . Homicidal ideation 06/19/2013  . Suicidal intent 06/19/2013  . Homicidal ideations 06/19/2013  . Hyperkalemia 06/16/2013  . Mandible fracture ([place]) 06/05/2013  . Fracture, mandible ([place]) 06/02/2013  . Coronary atherosclerosis of native coronary artery 06/02/2013  . ESRD on dialysis ([place]) 06/02/2013  . Mandible open fracture ([place]) 06/02/2013    Past Surgical History:  Procedure Laterality Date  . CORONARY ANGIOPLASTY  ?   PT UNABLE TO TELL IF  BEFORE OR AFTER  CABG  . CORONARY ARTERY BYPASS GRAFT  2008  ([place] , [place])   3 VESSEL  . DIALYSIS FISTULA CREATION  LAST SURGERY  APPOX  2008  . ESOPHAGOGASTRODUODENOSCOPY N/A 05/07/2015   Procedure: ESOPHAGOGASTRODUODENOSCOPY (EGD);  Surgeon: Hulen Luster, MD;  Location: [facility] ENDOSCOPY;  Service: Endoscopy;  Laterality: N/A;  . ESOPHAGOGASTRODUODENOSCOPY (EGD) WITH PROPOFOL N/A 05/17/2015   Procedure: ESOPHAGOGASTRODUODENOSCOPY (EGD) WITH PROPOFOL;  Surgeon: [name],  MD;  Location: [facility] ENDOSCOPY;  Service: Endoscopy;  Laterality: N/A;  . ESOPHAGOGASTRODUODENOSCOPY (EGD) WITH PROPOFOL N/A 01/20/2016   Procedure: ESOPHAGOGASTRODUODENOSCOPY (EGD) WITH PROPOFOL;  Surgeon: [name], MD;  Location: [facility] ENDOSCOPY;  Service: Endoscopy;  Laterality: N/A;  . ESOPHAGOGASTRODUODENOSCOPY  (EGD) WITH PROPOFOL N/A 04/17/2016   Procedure: ESOPHAGOGASTRODUODENOSCOPY (EGD) WITH PROPOFOL;  Surgeon: Lin Landsman, MD;  Location: [facility] ENDOSCOPY;  Service: Gastroenterology;  Laterality: N/A;  . MANDIBULAR HARDWARE REMOVAL N/A 07/29/2013   Procedure: REMOVAL OF ARCH BARS;  Surgeon: Theodoro Kos, DO;  Location: [place];  Service: Plastics;  Laterality: N/A;  . ORIF MANDIBULAR FRACTURE N/A 06/05/2013   Procedure: REPAIR OF MANDIBULAR FRACTURE x 2 with maxillo-mandibular fixation ;  Surgeon: Theodoro Kos, DO;  Location: [place];  Service: Plastics;  Laterality: N/A;  . PERIPHERAL ARTERIAL STENT GRAFT Left     Prior to Admission medications   Medication Sig Start Date End Date Taking? Authorizing Provider  amLODipine (NORVASC) 5 MG tablet Take 5 mg by mouth daily.    Historical Provider, MD  b complex-vitamin c-folic acid (NEPHRO-VITE) 0.8 MG TABS tablet Take 1 tablet by mouth daily.    Historical Provider, MD  calcium acetate (PHOSLO) 667 MG capsule Take 2,001 mg by mouth 3 (three) times daily with meals.    Historical Provider, MD  cinacalcet (SENSIPAR) 30 MG tablet Take 30 mg by mouth daily with supper.    Historical Provider, MD  furosemide (LASIX) 80 MG tablet Take 1 tablet by mouth daily.    Historical Provider, MD  gabapentin (NEURONTIN) 300 MG capsule Take 1 capsule (300 mg total) by mouth daily. Patient taking differently: Take 300 mg by mouth at bedtime.  03/27/15   Bettey Costa, MD  hydrOXYzine (VISTARIL) 25 MG capsule Take 1 capsule (25 mg total) by mouth 3 (three) times daily as needed for itching. 02/25/16   Jami L Hagler, PA-C  labetalol (NORMODYNE) 100 MG tablet Take 1 tablet (100 mg total) by mouth daily. 01/22/16   Epifanio Lesches, MD  nitroGLYCERIN (NITROSTAT) 0.4 MG SL tablet Place 1 tablet (0.4 mg total) under the tongue every 5 (five) minutes as needed. 04/12/16   Wende Bushy, MD  oxyCODONE-acetaminophen (ROXICET) 5-325 MG tablet Take 1-2 tablets by  mouth every 4 (four) hours as needed for severe pain. 04/21/16   Hinda Kehr, MD  pantoprazole (PROTONIX) 40 MG tablet Take 1 tablet (40 mg total) by mouth 2 (two) times daily. Patient taking differently: Take 40 mg by mouth daily.  01/22/16   Epifanio Lesches, MD    Allergies Patient has no known allergies.  Family History  Problem Relation Age of Onset  . Colon cancer Mother   . Cancer Father   . Cancer Sister     Social History Social History  Substance Use Topics  . Smoking status: Current Every Day Smoker    Packs/day: 0.15    Years: 40.00    Types: Cigarettes  . Smokeless tobacco: Never Used  . Alcohol use No     Comment: pt reports quitting after learning about cirrhosis    Review of Systems  Constitutional: Negative for fever. Eyes: Negative for visual changes. ENT: Negative for sore throat. Neck: No neck pain  Cardiovascular: Negative for chest pain. Respiratory: Negative for shortness of breath. Gastrointestinal: + diffuse abdominal pain and melena. No vomiting or diarrhea. Genitourinary: Negative for dysuria. Musculoskeletal: Negative for back pain. Skin: Negative for rash. Neurological: Negative for headaches, weakness or numbness. Psych: No SI  or HI  ____________________________________________   PHYSICAL EXAM:  VITAL SIGNS: ED Triage Vitals  Enc Vitals Group     BP 05/05/16 1801 122/62     Pulse Rate 05/05/16 1801 70     Resp 05/05/16 1801 18     Temp 05/05/16 1801 98.5 F (36.9 C)     Temp Source 05/05/16 1801 Oral     SpO2 05/05/16 1801 96 %     Weight 05/05/16 1801 185 lb (83.9 kg)     Height 05/05/16 1801 6\' 3"  (1.905 m)     Head Circumference --      Peak Flow --      Pain Score 05/05/16 1805 0     Pain Loc --      Pain Edu? --      Excl. in [place]? --     Constitutional: Alert and oriented. Well appearing and in no apparent distress. HEENT:      Head: Normocephalic and atraumatic.         Eyes: Conjunctivae are normal. Sclera is  non-icteric. EOMI. PERRL      Mouth/Throat: Mucous membranes are moist.       Neck: Supple with no signs of meningismus. Cardiovascular: Regular rate and rhythm. No murmurs, gallops, or rubs. 2+ symmetrical distal pulses are present in all extremities. No JVD. Respiratory: Normal respiratory effort. Lungs are clear to auscultation bilaterally. No wheezes, crackles, or rhonchi.  Gastrointestinal: Distended with diffuse ttp with positive bowel sounds. No rebound or guarding. Genitourinary: No CVA tenderness.Rectal exam showing black stool grossly guaiac positive Musculoskeletal: Nontender with normal range of motion in all extremities. No edema, cyanosis, or erythema of extremities. Neurologic: Normal speech and language. Face is symmetric. Moving all extremities. No gross focal neurologic deficits are appreciated. Skin: Skin is warm, dry and intact. No rash noted. Psychiatric: Mood and affect are normal. Speech and behavior are normal.  ____________________________________________   LABS (all labs ordered are listed, but only abnormal results are displayed)  Labs Reviewed  COMPREHENSIVE METABOLIC PANEL - Abnormal; Notable for the following:       Result Value   Sodium 133 (*)    Potassium 3.1 (*)    Chloride 93 (*)    Glucose, Bld 140 (*)    BUN 33 (*)    Creatinine, Ser 5.30 (*)    Alkaline Phosphatase 166 (*)    Total Bilirubin 1.5 (*)    GFR calc non Af Amer 11 (*)    GFR calc Af Amer 13 (*)    All other components within normal limits  CBC - Abnormal; Notable for the following:    RBC 2.87 (*)    Hemoglobin 8.2 (*)    HCT 25.7 (*)    MCHC 31.9 (*)    RDW 18.4 (*)    All other components within normal limits  BODY FLUID CULTURE  PROTIME-INR  BODY FLUID CELL COUNT WITH DIFFERENTIAL  POC OCCULT BLOOD, ED  TYPE AND SCREEN   ____________________________________________  EKG  ED ECG REPORT I, Rudene Re, the attending physician, personally viewed and interpreted  this ECG.   Normal sinus rhythm, rate of 76, prolonged QTC, normal axis, no ST elevations or depressions. Unchanged from prior.  ____________________________________________  RADIOLOGY  none ____________________________________________   PROCEDURES  Procedure(s) performed: YES .Paracentesis Date/Time: 05/05/2016 8:01 PM Performed by: Rudene Re Authorized by: Rudene Re   Consent:    Consent obtained:  Written   Consent given by:  Patient   Risks  discussed:  Bleeding, bowel perforation, infection and pain Pre-procedure details:    Procedure purpose:  Diagnostic   Preparation: Patient was prepped and draped in usual sterile fashion   Anesthesia (see MAR for exact dosages):    Anesthesia method:  Local infiltration   Local anesthetic:  Lidocaine 1% w/o epi Procedure details:    Needle gauge:  20   Ultrasound guidance: yes     Puncture site:  R lower quadrant   Fluid removed amount:  20   Fluid appearance:  Bloody   Dressing:  Adhesive bandage Post-procedure details:    Patient tolerance of procedure:  Tolerated well, no immediate complications     Bedside US: moderate to large volume ascites  Critical Care performed:  None ____________________________________________   INITIAL IMPRESSION / ASSESSMENT AND PLAN / ED COURSE  56 y.o. male h/o ESRD on HD (TTS), cirrhosis c/b ascites, CAD, HTN, HLD, multiple GIB requiring transfusion from erosive gastritis and AVMs who presents for evaluation of melena since yesterday and diffuse abdominal distention. Patient with moderate to large volume ascites on bedside ultrasound. Paracentesis was performed. We'll start patient on ceftriaxone. Patient also has grossly guaiac positive stools. We'll start a PPI. Hemoglobin is stable at this time. Hemodynamically stable at this time. Patient does not have very see therefore octreotide was not started. Unfortunately we do not have GI coverage into tomorrow 7 PM therefore will  call [facility] for transfer.  Clinical Course as of May 05 2040  Thu May 05, 2016  2034 Discussed patient's presentation, vital signs, blood work with Dr. Myna Hidalgo, hospitalist and Dr. Orie Fisherman, GI at [facility] who accepted patient for transfer. Patient remains HD stable. Paracentesis results pending. Patient receiving ceftriaxone and ppi. Care transferred to Dr. Reita Cliche  [CV]    Clinical Course User Index [CV] Rudene Re, MD    Pertinent labs & imaging results that were available during my care of the patient were reviewed by me and considered in my medical decision making (see chart for details).    ____________________________________________   FINAL CLINICAL IMPRESSION(S) / ED DIAGNOSES  Final diagnoses:  Acute GI bleeding  Generalized abdominal pain  Ascites due to alcoholic cirrhosis (HCC)      NEW MEDICATIONS STARTED DURING THIS VISIT:  New Prescriptions   No medications on file     Note:  This document was prepared using Dragon voice recognition software and may include unintentional dictation errors.    Rudene Re, MD 05/05/16 2042

## 2016-05-05 NOTE — ED Triage Notes (Signed)
C/O blood in stool x 1 1/2 years and nose bleed today.  AAOx3  Skin warm and dry.  No obvious epistaxis.

## 2016-05-06 ENCOUNTER — Encounter (HOSPITAL_COMMUNITY): Payer: Self-pay

## 2016-05-06 ENCOUNTER — Inpatient Hospital Stay (HOSPITAL_COMMUNITY): Payer: Medicare Other

## 2016-05-06 DIAGNOSIS — K921 Melena: Secondary | ICD-10-CM

## 2016-05-06 DIAGNOSIS — R1084 Generalized abdominal pain: Secondary | ICD-10-CM

## 2016-05-06 DIAGNOSIS — K7031 Alcoholic cirrhosis of liver with ascites: Secondary | ICD-10-CM

## 2016-05-06 DIAGNOSIS — D62 Acute posthemorrhagic anemia: Secondary | ICD-10-CM

## 2016-05-06 LAB — COMPREHENSIVE METABOLIC PANEL
ALBUMIN: 3.3 g/dL — AB (ref 3.5–5.0)
ALT: 23 U/L (ref 17–63)
AST: 23 U/L (ref 15–41)
Alkaline Phosphatase: 159 U/L — ABNORMAL HIGH (ref 38–126)
Anion gap: 13 (ref 5–15)
BUN: 35 mg/dL — AB (ref 6–20)
CHLORIDE: 96 mmol/L — AB (ref 101–111)
CO2: 24 mmol/L (ref 22–32)
CREATININE: 5.93 mg/dL — AB (ref 0.61–1.24)
Calcium: 9.7 mg/dL (ref 8.9–10.3)
GFR calc Af Amer: 11 mL/min — ABNORMAL LOW (ref >60)
GFR calc non Af Amer: 10 mL/min — ABNORMAL LOW (ref >60)
GLUCOSE: 92 mg/dL (ref 65–99)
Potassium: 4 mmol/L (ref 3.5–5.1)
SODIUM: 133 mmol/L — AB (ref 135–145)
Total Bilirubin: 1.7 mg/dL — ABNORMAL HIGH (ref 0.3–1.2)
Total Protein: 7 g/dL (ref 6.5–8.1)

## 2016-05-06 LAB — HEMATOCRIT
HCT: 27.4 % — ABNORMAL LOW (ref 39.0–52.0)
HCT: 28.3 % — ABNORMAL LOW (ref 39.0–52.0)
HEMATOCRIT: 27.5 % — AB (ref 39.0–52.0)

## 2016-05-06 LAB — TYPE AND SCREEN
ABO/RH(D): A POS
Antibody Screen: NEGATIVE

## 2016-05-06 LAB — HEMOGLOBIN
Hemoglobin: 8 g/dL — ABNORMAL LOW (ref 13.0–17.0)
Hemoglobin: 8.1 g/dL — ABNORMAL LOW (ref 13.0–17.0)
Hemoglobin: 8.4 g/dL — ABNORMAL LOW (ref 13.0–17.0)

## 2016-05-06 LAB — PROTIME-INR
INR: 1.22
Prothrombin Time: 15.5 seconds — ABNORMAL HIGH (ref 11.4–15.2)

## 2016-05-06 LAB — ABO/RH: ABO/RH(D): A POS

## 2016-05-06 LAB — PATHOLOGIST SMEAR REVIEW

## 2016-05-06 LAB — MAGNESIUM: MAGNESIUM: 2.2 mg/dL (ref 1.7–2.4)

## 2016-05-06 LAB — HIV ANTIBODY (ROUTINE TESTING W REFLEX): HIV Screen 4th Generation wRfx: NONREACTIVE

## 2016-05-06 MED ORDER — PEG-KCL-NACL-NASULF-NA ASC-C 100 G PO SOLR
1.0000 | Freq: Once | ORAL | Status: AC
  Administered 2016-05-06: 200 g via ORAL
  Filled 2016-05-06: qty 1

## 2016-05-06 MED ORDER — ADULT MULTIVITAMIN W/MINERALS CH: 1.0000 | ORAL_TABLET | Freq: Every day | ORAL | Status: DC

## 2016-05-06 MED ORDER — FAMOTIDINE 20 MG PO TABS
20.0000 mg | ORAL_TABLET | Freq: Once | ORAL | Status: AC
  Administered 2016-05-06: 20 mg via ORAL
  Filled 2016-05-06: qty 1

## 2016-05-06 MED ORDER — ATORVASTATIN CALCIUM 40 MG PO TABS
40.0000 mg | ORAL_TABLET | Freq: Every day | ORAL | Status: DC
  Administered 2016-05-06 – 2016-05-09 (×4): 40 mg via ORAL
  Filled 2016-05-06 (×4): qty 1

## 2016-05-06 MED ORDER — ATORVASTATIN CALCIUM 40 MG PO TABS: 40.0000 mg | ORAL_TABLET | Freq: Every day | ORAL | Status: DC

## 2016-05-06 MED ORDER — MOMETASONE FURO-FORMOTEROL FUM 200-5 MCG/ACT IN AERO
2.0000 | INHALATION_SPRAY | Freq: Two times a day (BID) | RESPIRATORY_TRACT | Status: DC
  Administered 2016-05-06 – 2016-05-09 (×6): 2 via RESPIRATORY_TRACT
  Filled 2016-05-06: qty 8.8

## 2016-05-06 NOTE — Consult Note (Addendum)
[facility] Consult Note   History Stanley [name] MRN # [id number]  Date of Admission: 05/05/2016 Date of Consultation: 05/06/2016 Referring physician: Dr. Geradine Girt, DO  Reason for Consultation/Chief Complaint: Melena  Subjective  HPI:  This is a 56 year old man with a complicated past medical history as outlined below who was transferred from [facility] due to reported lack of GI coverage there yesterday. He presented there with 2 days of melena and progressive weakness with some vague generalized abdominal pain. He has had a chronic history of GI bleeding over about the last year with multiple endoscopic procedures. He had no upper endoscopy in November 2017 at [facility] that was normal. He had a reported small bowel enteroscopy on 04/17/2016, but the body of the report indicates the scope was only passed to the fourth portion of the duodenum due to scope looping. That report also notes plans for a small bowel capsule study, but that has not been done to date. Because the patient's hemoglobin had dropped about a gram within the last week it was felt that he required inpatient workup. He denies chest pain, says he has chronic dyspnea with exertion and a chronic dry cough. Diagnostic paracentesis was apparently done in the [facility] ER yesterday revealing a WBC count of 427, but only 1% neutrophils. He was then given ceftriaxone. The patient is still on his daily aspirin, no other antiplatelet agents: He denies use of NSAIDs. He underwent hemodialysis yesterday on his usual Tuesday Thursday Saturday schedule.  ROS:  Constitutional:  He denies recent weight change  All other systems are negative except as noted above in the HPI  Past Medical History Past Medical History:  Diagnosis Date  . Alcohol abuse   . Cirrhosis ([place])   . Coronary artery disease 2009  . Diabetic peripheral neuropathy ([place])   . Drug abuse   . End  stage renal disease on dialysis [facility]) NEPHROLOGIST-   DR [facility]  IN [place]   HEMODIALYSIS --   TUES/  THURS/  SAT  . GERD (gastroesophageal reflux disease)   . Hyperlipidemia   . Hypertension   . PAD (peripheral artery disease) ([place])   . Suicidal ideation    & HOMICIDAL IDEATION --  06-16-2013   ADMITTED TO BEHAVIOR HEALTH    Past Surgical History Past Surgical History:  Procedure Laterality Date  . CORONARY ANGIOPLASTY  ?   PT UNABLE TO TELL IF  BEFORE OR AFTER  CABG  . CORONARY ARTERY BYPASS GRAFT  2008  ([place] , [place])   3 VESSEL  . DIALYSIS FISTULA CREATION  LAST SURGERY  APPOX  2008  . ESOPHAGOGASTRODUODENOSCOPY N/A 05/07/2015   Procedure: ESOPHAGOGASTRODUODENOSCOPY (EGD);  Surgeon: Hulen Luster, MD;  Location: [facility] ENDOSCOPY;  Service: Endoscopy;  Laterality: N/A;  . ESOPHAGOGASTRODUODENOSCOPY (EGD) WITH PROPOFOL N/A 05/17/2015   Procedure: ESOPHAGOGASTRODUODENOSCOPY (EGD) WITH PROPOFOL;  Surgeon: [name], MD;  Location: [facility] ENDOSCOPY;  Service: Endoscopy;  Laterality: N/A;  . ESOPHAGOGASTRODUODENOSCOPY (EGD) WITH PROPOFOL N/A 01/20/2016   Procedure: ESOPHAGOGASTRODUODENOSCOPY (EGD) WITH PROPOFOL;  Surgeon: [name], MD;  Location: [facility] ENDOSCOPY;  Service: Endoscopy;  Laterality: N/A;  . ESOPHAGOGASTRODUODENOSCOPY (EGD) WITH PROPOFOL N/A 04/17/2016   Procedure: ESOPHAGOGASTRODUODENOSCOPY (EGD) WITH PROPOFOL;  Surgeon: Lin Landsman, MD;  Location: [facility] ENDOSCOPY;  Service: Gastroenterology;  Laterality: N/A;  . MANDIBULAR HARDWARE REMOVAL N/A 07/29/2013   Procedure: REMOVAL OF ARCH BARS;  Surgeon: Theodoro Kos, DO;  Location: [place];  Service: Plastics;  Laterality: N/A;  . ORIF MANDIBULAR FRACTURE N/A 06/05/2013   Procedure: REPAIR OF MANDIBULAR FRACTURE x 2 with maxillo-mandibular fixation ;  Surgeon: Theodoro Kos, DO;  Location: [place];  Service: Plastics;  Laterality: N/A;  . PERIPHERAL ARTERIAL STENT GRAFT Left     Family History Family History   Problem Relation Age of Onset  . Colon cancer Mother   . Cancer Father   . Cancer Sister     Social History Social History   Social History  . Marital status: Single    Spouse name: N/A  . Number of children: N/A  . Years of education: N/A   Occupational History  . disabled    Social History Main Topics  . Smoking status: Current Every Day Smoker    Packs/day: 0.15    Years: 40.00    Types: Cigarettes  . Smokeless tobacco: Never Used  . Alcohol use No     Comment: pt reports quitting after learning about cirrhosis  . Drug use: Yes    Frequency: 7.0 times per week    Types: Marijuana     Comment: last used cocaine in 2016  . Sexual activity: No   Other Topics Concern  . None   Social History Narrative  . None   He says he quit alcohol several months ago when he was told he had cirrhosis.  Allergies No Known Allergies  Outpatient Meds Home medications from the H+P and/or nursing med reconciliation reviewed.  Inpatient med list reviewed  _____________________________________________________________________ Objective   Exam:  Current vital signs  Patient Vitals for the past 8 hrs:  BP Temp Temp src Pulse Resp SpO2 Weight  05/06/16 0503 116/70 98.1 F (36.7 C) Oral 71 20 100 % 185 lb (83.9 kg)   No intake or output data in the 24 hours ending 05/06/16 0908  Physical Exam:    General: this is a Chronically ill-appearing middle-aged male patient in no acute distress. He was brushing his teeth at [place], and the room.  Eyes: sclera anicteric, no redness  ENT: oral mucosa moist without lesions, no cervical or supraclavicular lymphadenopathy, good dentition  CV: RRR without murmur, S1/S2, no JVD,, no peripheral edema  Resp: clear to auscultation bilaterally, normal RR and effort noted  GI: soft, no tenderness, with active bowel sounds. His abdomen is protuberant from ascites, this limits the evaluation of hepatosplenomegaly.  Skin; warm and dry, no  rash or jaundice noted  Neuro: awake, alert and oriented x 3. Normal gross motor function and fluent speech. Rectal exam was deferred, as the documentation from ER records indicates melena was found. Labs:   Recent Labs Lab 05/05/16 1813 05/05/16 2351 05/06/16 0635  WBC 7.3  --   --   HGB 8.2* 8.4* 8.1*  HCT 25.7* 28.3* 27.5*  PLT 279  --   --     Recent Labs Lab 05/05/16 2351  NA 133*  K 4.0  CL 96*  CO2 24  BUN 35*  ALBUMIN 3.3*  ALKPHOS 159*  ALT 23  AST 23  GLUCOSE 92    Recent Labs Lab 05/05/16 2351  INR 1.22    @ASSESSMENTPLANBEGIN @ Impression:  Melena  Anemia of acute on chronic GI blood loss. There is recurrent obscure small bowel GI blood loss on top of anemia of chronic kidney disease. It is not clear why this patient remains on aspirin despite recurrent bleeding.  Alcohol related cirrhosis, now abstinent from alcohol by his report. He has ascites which is from  both portal hypertension and also chronic kidney disease. He does not have over 500 WBCs or 250 PMNs on, so he does not have SBP does not need further antibiotics  No signs of hepatic encephalopathy  Generalized abdominal pain from ascites  Ascites or causes as noted above  ______________________________________________________________________________  It is not clear whether this patient did not undergo video capsule endoscopy weeks ago, but he clearly needs one now. His hemoglobin is stable overnight he does not currently need transfusion. We will continue to check in every 8 hours and transfuse as needed. If he does require transfusion, it would be best to do it while he is on dialysis in order to manage his fluid balance.  Plan:  He can eat today, we will put in orders for prep this evening and small bowel video capsule study tomorrow.    Thank you for the courtesy of this consult.  Please contact me with any questions or concerns.  Nelida Meuse III Pager: [phone] Mon-Fri  8a-5p [phone] after 5p, weekends, holidays

## 2016-05-06 NOTE — Progress Notes (Signed)
Not enough fluid in abdomen to safely proceed with a therapeutic only paracentesis.  Patient returned to his room.  Brennin Durfee E 10:48 AM 05/06/2016

## 2016-05-06 NOTE — Progress Notes (Signed)
PROGRESS NOTE    Stanley [name]  WLN:[id number] DOB: 1960/05/21 DOA: 05/05/2016 PCP: Elyse Jarvis, MD   Outpatient Specialists:     Brief Narrative:  Stanley [name] is a 56 y.o. male with medical history significant for remote alcohol abuse, cirrhosis with ascites, coronary artery disease, end-stage renal disease on hemodialysis, and recurrent GI bleed who presents to the ED in [place] with 2 days of melena and abdominal pain. Patient reports that he was in his usual state of health until the insidious development of mild to moderate generalized abdominal pain approximately 2 days ago which she describes as sharp in character, waxing and waning, with no alleviating or exacerbating factors identified, and similar to prior abdominal pain. Patient is also noted dark tarry stools for the past 2 days, but denies any bright red blood. He denies any nausea, vomiting, or diarrhea. He denies any recent fevers or chills and denies cough or dyspnea. He reports completing his dialysis today without incident. He reports adherence with his daily Protonix.    Assessment & Plan:   Principal Problem:   GIB (gastrointestinal bleeding) Active Problems:   ESRD on dialysis ([place])   HTN (hypertension)   GERD (gastroesophageal reflux disease)   Cirrhosis of liver with ascites (HCC)   GI bleed   Gastritis   Abdominal pain   GI bleeding, normocytic anemia   - Pt has hx of recurrent GIB, follows with GI outpatient  - Presents with 2 days melena and vague generalized abdominal pain  - Had SB enteroscopy on 04/17/16 with normal esophagus, stomach, and duodenum; scope could not be advanced past jejunum  - He takes daily Protonix at home, no antiplatelet or anticoagulant  - Hgb is 8.2 on admission, down from 9.1 two weeks earlier  - DRE reveals grossly melanotic stool - GI is consulting and much appreciated-- plan for capsule endo tomm - Type and screen has been performed; continue Protonix 40 mg IV  q12h; follow serial H&H   Cirrhosis with ascites  - Pt reports abstaining from EtOH since he was diagnosed; hep B and C negative last March  - No varices noted on endoscopy, most recently 04/17/16  - Managed with Lasix and Protonix at home; Lasix held, Protonix given IV as above  - He has resistant ascites, requiring paracentesis q1-35mos; there is increased distension and pain and IR has been consulted for therapeutic paracentesis - Continue PPI as above, monitor electrolytes   ESRD - Dialyzes TTS via AVF in proximal RUE  - Pt reports completing HD without incident on 05/05/2016 and there are no urgent indications on admission  - Continue binders, renal diet with fluid-restrictions, renally-dose medications    GERD, hx gastritis  - Managed with daily Protonix at home  - Treating with Protonix 40 mg IV q12h as above    Hypertension  - BP at goal  - Continue Norvasc and labetalol as tolerated     DVT prophylaxis:  SCD's  Code Status: Full Code   Family Communication:   Disposition Plan:     Consultants:   GI  Renal notified of need for dialysis tomm   Subjective: No further bleeding here  Objective: Vitals:   05/05/16 2225 05/06/16 0503  BP: 129/76 116/70  Pulse: 72 71  Resp: 18 20  Temp: 98.3 F (36.8 C) 98.1 F (36.7 C)  TempSrc: Oral Oral  SpO2: 99% 100%  Weight: 84 kg (185 lb 1.6 oz) 83.9 kg (185 lb)  Height: 6\' 3"  (1.905  m)    No intake or output data in the 24 hours ending 05/06/16 0915 Filed Weights   05/05/16 2225 05/06/16 0503  Weight: 84 kg (185 lb 1.6 oz) 83.9 kg (185 lb)    Examination:  General exam: Appears calm and comfortable  Respiratory system: Clear to auscultation. Respiratory effort normal. Cardiovascular system: S1 & S2 heard, RRR. No JVD, murmurs, rubs, gallops or clicks. No pedal edema. Gastrointestinal system: Abdomen is nondistended, soft and nontender. No organomegaly or masses felt. Normal bowel sounds heard. Central  nervous system: Alert and oriented. No focal neurological deficits.     Data Reviewed: I have personally reviewed following labs and imaging studies  CBC:  Recent Labs Lab 05/05/16 1813 05/05/16 2351 05/06/16 0635  WBC 7.3  --   --   HGB 8.2* 8.4* 8.1*  HCT 25.7* 28.3* 27.5*  MCV 89.3  --   --   PLT 279  --   --    Basic Metabolic Panel:  Recent Labs Lab 05/05/16 1813 05/05/16 2351  NA 133* 133*  K 3.1* 4.0  CL 93* 96*  CO2 28 24  GLUCOSE 140* 92  BUN 33* 35*  CREATININE 5.30* 5.93*  CALCIUM 9.1 9.7  MG  --  2.2   GFR: Estimated Creatinine Clearance: 16.5 mL/min (by C-G formula based on SCr of 5.93 mg/dL (H)). Liver Function Tests:  Recent Labs Lab 05/05/16 1813 05/05/16 2351  AST 26 23  ALT 24 23  ALKPHOS 166* 159*  BILITOT 1.5* 1.7*  PROT 7.3 7.0  ALBUMIN 3.5 3.3*   No results for input(s): LIPASE, AMYLASE in the last 168 hours. No results for input(s): AMMONIA in the last 168 hours. Coagulation Profile:  Recent Labs Lab 05/05/16 1932 05/05/16 2351  INR 1.13 1.22   Cardiac Enzymes: No results for input(s): CKTOTAL, CKMB, CKMBINDEX, TROPONINI in the last 168 hours. BNP (last 3 results) No results for input(s): PROBNP in the last 8760 hours. HbA1C: No results for input(s): HGBA1C in the last 72 hours. CBG:  Recent Labs Lab 05/05/16 2125  GLUCAP 84   Lipid Profile: No results for input(s): CHOL, HDL, LDLCALC, TRIG, CHOLHDL, LDLDIRECT in the last 72 hours. Thyroid Function Tests: No results for input(s): TSH, T4TOTAL, FREET4, T3FREE, THYROIDAB in the last 72 hours. Anemia Panel: No results for input(s): VITAMINB12, FOLATE, FERRITIN, TIBC, IRON, RETICCTPCT in the last 72 hours. Urine analysis:    Component Value Date/Time   COLORURINE YELLOW (A) 05/03/2015 0815   APPEARANCEUR CLEAR (A) 05/03/2015 0815   APPEARANCEUR Clear 11/13/2013 1314   LABSPEC 1.008 05/03/2015 0815   LABSPEC 1.014 11/13/2013 1314   PHURINE 9.0 (H) 05/03/2015  0815   GLUCOSEU NEGATIVE 05/03/2015 0815   GLUCOSEU 50 mg/dL 11/13/2013 1314   HGBUR NEGATIVE 05/03/2015 0815   BILIRUBINUR NEGATIVE 05/03/2015 0815   BILIRUBINUR Negative 11/13/2013 1314   KETONESUR NEGATIVE 05/03/2015 0815   PROTEINUR 100 (A) 05/03/2015 0815   UROBILINOGEN 0.2 06/22/2014 1230   NITRITE NEGATIVE 05/03/2015 0815   LEUKOCYTESUR NEGATIVE 05/03/2015 0815   LEUKOCYTESUR Negative 11/13/2013 1314     ) Recent Results (from the past 240 hour(s))  Body fluid culture     Status: None (Preliminary result)   Collection Time: 05/05/16  7:34 PM  Result Value Ref Range Status   Specimen Description ASCITIES  Final   Special Requests NONE  Final   Gram Stain   Final    RARE WBC PRESENT, PREDOMINANTLY MONONUCLEAR NO ORGANISMS SEEN Performed at [facility]  [facility], [place] [address] [place], [place] [address]    Culture PENDING  Incomplete   Report Status PENDING  Incomplete      Anti-infectives    None       Radiology Studies: No results found.      Scheduled Meds: . amLODipine  5 mg Oral Daily  . calcium acetate  2,001 mg Oral TID WC  . cinacalcet  30 mg Oral Q supper  . gabapentin  300 mg Oral QHS  . labetalol  100 mg Oral Daily  . multivitamin  1 tablet Oral QHS  . pantoprazole (PROTONIX) IV  40 mg Intravenous Q12H  . sodium chloride flush  3 mL Intravenous Q12H  . sodium chloride flush  3 mL Intravenous Q12H   Continuous Infusions:   LOS: 1 day    Time spent: 25 min    [place], DO [facility] Pager [phone]  If 7PM-7AM, please contact night-coverage [email or web address] Password [email or web address] 05/06/2016, 9:15 AM

## 2016-05-06 NOTE — Consult Note (Addendum)
Renal Service Consult Note [facility]  Stanley [name] 05/06/2016 [place] Requesting Physician:  Dr Eliseo Squires  Reason for Consult:  ESRD pt with GIB HPI: The patient is a 56 y.o. year-old with history of CAD/ CABG, ESRD on HD, former ETOH, hx cirrhosis and recurrent GIB, presenting with 2 day hx of abd pain and black stools to [facility] ED on 2/22.  They had not GI coverage so pt transferred here.  Hb 8.2 on admission, melena noted on rectal exam. Admitted.    History of recurrent ascites and has had paracentesis evry 1-2 mos, not sure duration of this.  Pt today says he is hearing different things from different providers, says that [facility] told him he "doesn't have cirrhosis".  No recent HD issues or access issues. On HD 10 yrs, has Alport's syndrome, has 2 brothers with renal failure and 1 without. His daughter is a carrier, no CKD.    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  Had one daughter. Spent "most of my time" in prison off and on, 7-8 prison stays per patient over lifetime.  Lives alone now in [place]. Quit drinking when dx'd w liver disease, +tobacco smoker.  Hard to walk now but doesn't use walker or WC.  Is not driving.    Had CABG 2009, stents in his leg for "PAD".     ROS  denies CP  no joint pain   no HA  no blurry vision  no rash  no diarrhea  no nausea/ vomiting  no dysuria  no difficulty voiding  no change in urine color    Past Medical History  Past Medical History:  Diagnosis Date  . Alcohol abuse   . Cirrhosis ([place])   . Coronary artery disease 2009  . Diabetic peripheral neuropathy ([place])   . Drug abuse   . End stage renal disease on dialysis [facility]) NEPHROLOGIST-   DR [facility]  IN [place]   HEMODIALYSIS --   TUES/  THURS/  SAT  . GERD (gastroesophageal reflux disease)   . Hyperlipidemia   . Hypertension   . PAD (peripheral artery disease) ([place])   . Suicidal ideation    & HOMICIDAL  IDEATION --  06-16-2013   ADMITTED TO BEHAVIOR HEALTH   Past Surgical History  Past Surgical History:  Procedure Laterality Date  . CORONARY ANGIOPLASTY  ?   PT UNABLE TO TELL IF  BEFORE OR AFTER  CABG  . CORONARY ARTERY BYPASS GRAFT  2008  ([place] , [place])   3 VESSEL  . DIALYSIS FISTULA CREATION  LAST SURGERY  APPOX  2008  . ESOPHAGOGASTRODUODENOSCOPY N/A 05/07/2015   Procedure: ESOPHAGOGASTRODUODENOSCOPY (EGD);  Surgeon: Hulen Luster, MD;  Location: [facility] ENDOSCOPY;  Service: Endoscopy;  Laterality: N/A;  . ESOPHAGOGASTRODUODENOSCOPY (EGD) WITH PROPOFOL N/A 05/17/2015   Procedure: ESOPHAGOGASTRODUODENOSCOPY (EGD) WITH PROPOFOL;  Surgeon: [name], MD;  Location: [facility] ENDOSCOPY;  Service: Endoscopy;  Laterality: N/A;  . ESOPHAGOGASTRODUODENOSCOPY (EGD) WITH PROPOFOL N/A 01/20/2016   Procedure: ESOPHAGOGASTRODUODENOSCOPY (EGD) WITH PROPOFOL;  Surgeon: [name], MD;  Location: [facility] ENDOSCOPY;  Service: Endoscopy;  Laterality: N/A;  . ESOPHAGOGASTRODUODENOSCOPY (EGD) WITH PROPOFOL N/A 04/17/2016   Procedure: ESOPHAGOGASTRODUODENOSCOPY (EGD) WITH PROPOFOL;  Surgeon: Lin Landsman, MD;  Location: [facility] ENDOSCOPY;  Service: Gastroenterology;  Laterality: N/A;  . MANDIBULAR HARDWARE REMOVAL N/A 07/29/2013   Procedure: REMOVAL OF ARCH BARS;  Surgeon: Theodoro Kos, DO;  Location: [place];  Service: Clinical cytogeneticist;  Laterality: N/A;  . ORIF MANDIBULAR FRACTURE N/A 06/05/2013   Procedure: REPAIR OF MANDIBULAR FRACTURE x 2 with maxillo-mandibular fixation ;  Surgeon: Theodoro Kos, DO;  Location: [place];  Service: Plastics;  Laterality: N/A;  . PERIPHERAL ARTERIAL STENT GRAFT Left    Family History  Family History  Problem Relation Age of Onset  . Colon cancer Mother   . Cancer Father   . Cancer Sister    Social History  reports that he has been smoking Cigarettes.  He has a 6.00 pack-year smoking history. He has never used smokeless tobacco. He reports that he uses drugs, including  Marijuana, about 7 times per week. He reports that he does not drink alcohol. Allergies No Known Allergies Home medications Prior to Admission medications   Medication Sig Start Date End Date Taking? Authorizing Provider  amLODipine (NORVASC) 5 MG tablet Take 5 mg by mouth daily.   Yes Historical Provider, MD  atorvastatin (LIPITOR) 40 MG tablet Take 40 mg by mouth daily. 04/19/16  Yes Historical Provider, MD  b complex-vitamin c-folic acid (NEPHRO-VITE) 0.8 MG TABS tablet Take 1 tablet by mouth daily.   Yes Historical Provider, MD  budesonide-formoterol (SYMBICORT) 160-4.5 MCG/ACT inhaler Inhale 2 puffs into the lungs daily.   Yes Historical Provider, MD  calcium acetate (PHOSLO) 667 MG capsule Take 2,001 mg by mouth 3 (three) times daily before meals.    Yes Historical Provider, MD  cinacalcet (SENSIPAR) 30 MG tablet Take 30 mg by mouth daily with supper.   Yes Historical Provider, MD  furosemide (LASIX) 80 MG tablet Take 80 mg by mouth daily.    Yes Historical Provider, MD  gabapentin (NEURONTIN) 300 MG capsule Take 1 capsule (300 mg total) by mouth daily. Patient taking differently: Take 300 mg by mouth at bedtime.  03/27/15  Yes Bettey Costa, MD  labetalol (NORMODYNE) 100 MG tablet Take 1 tablet (100 mg total) by mouth daily. 01/22/16  Yes Epifanio Lesches, MD  Multiple Vitamin (MULTIVITAMIN WITH MINERALS) TABS tablet Take 1 tablet by mouth daily. Men's One a Day   Yes Historical Provider, MD  naproxen (NAPROSYN) 500 MG tablet Take 500 mg by mouth 2 (two) times daily as needed (pain).   Yes Historical Provider, MD  nitroGLYCERIN (NITROSTAT) 0.4 MG SL tablet Place 1 tablet (0.4 mg total) under the tongue every 5 (five) minutes as needed. Patient taking differently: Place 0.4 mg under the tongue every 5 (five) minutes as needed for chest pain.  04/12/16  Yes Wende Bushy, MD  oxyCODONE-acetaminophen (ROXICET) 5-325 MG tablet Take 1-2 tablets by mouth every 4 (four) hours as needed for severe pain.  04/21/16  Yes Hinda Kehr, MD  pantoprazole (PROTONIX) 40 MG tablet Take 1 tablet (40 mg total) by mouth 2 (two) times daily. Patient taking differently: Take 40 mg by mouth daily.  01/22/16  Yes Epifanio Lesches, MD  hydrOXYzine (VISTARIL) 25 MG capsule Take 1 capsule (25 mg total) by mouth 3 (three) times daily as needed for itching. Patient not taking: Reported on 05/06/2016 02/25/16   Braxton Feathers, PA-C   Liver Function Tests  Recent Labs Lab 05/05/16 1813 05/05/16 2351  AST 26 23  ALT 24 23  ALKPHOS 166* 159*  BILITOT 1.5* 1.7*  PROT 7.3 7.0  ALBUMIN 3.5 3.3*   No results for input(s): LIPASE, AMYLASE in the last 168 hours. CBC  Recent Labs Lab 05/05/16 1813 05/05/16 2351 05/06/16 0635  WBC 7.3  --   --  HGB 8.2* 8.4* 8.1*  HCT 25.7* 28.3* 27.5*  MCV 89.3  --   --   PLT 279  --   --    Basic Metabolic Panel  Recent Labs Lab 05/05/16 1813 05/05/16 2351  NA 133* 133*  K 3.1* 4.0  CL 93* 96*  CO2 28 24  GLUCOSE 140* 92  BUN 33* 35*  CREATININE 5.30* 5.93*  CALCIUM 9.1 9.7   Iron/TIBC/Ferritin/ %Sat    Component Value Date/Time   IRON 24 (L) 03/23/2015 1136   TIBC 379 03/23/2015 1136   FERRITIN 136 03/23/2015 1136   IRONPCTSAT 6 (L) 03/23/2015 1136    Vitals:   05/05/16 2225 05/06/16 0503  BP: 129/76 116/70  Pulse: 72 71  Resp: 18 20  Temp: 98.3 F (36.8 C) 98.1 F (36.7 C)  TempSrc: Oral Oral  SpO2: 99% 100%  Weight: 84 kg (185 lb 1.6 oz) 83.9 kg (185 lb)  Height: 6\' 3"  (1.905 m)    Exam Gen alert, no distress No rash, cyanosis or gangrene Sclera anicteric, throat clear  No jvd or bruits Chest clear bilat RRR no MRG, +sternotomy scar Abd soft ntnd no mass or ascites +bs GU normal male MS no joint effusions or deformity Ext no LE edema / no wounds or ulcers Neuro is alert, Ox 3 , nf  RUA AVF +bruit    Dialysis: TTS [facility]  3h 41min   82.5kg   1K/2.5 bath   Hep none       RUA AVF (w several stents, can be a "tough  stick")  - EPO 6000 tiw  - Hect 7 ug tiw   Assessment: 1. GI bleed/ melena - per primary/ GI 2. ESRD HD TTS 3. Vol close to dry 4. Hx cirrhosis/ recur ascites 5. Anemia - on EPO at outpt HD 6. HTN on norvasc/ labetolol 7. MBD cont phoslo/ sensipar   Plan -  HD tomorrow, UF to dry wt, no heparin.  Will follow.   Kelly Splinter MD [employer] pager [phone]   05/06/2016, 2:10 PM

## 2016-05-07 ENCOUNTER — Encounter (HOSPITAL_COMMUNITY)
Admission: AD | Disposition: A | Discharge status: Home / Self Care | Payer: Self-pay | Source: Other Acute Inpatient Hospital | Attending: Internal Medicine

## 2016-05-07 DIAGNOSIS — K219 Gastro-esophageal reflux disease without esophagitis: Secondary | ICD-10-CM

## 2016-05-07 HISTORY — PX: GIVENS CAPSULE STUDY: SHX5432

## 2016-05-07 LAB — CBC
HEMATOCRIT: 28.4 % — AB (ref 39.0–52.0)
HEMOGLOBIN: 8.3 g/dL — AB (ref 13.0–17.0)
MCH: 27.6 pg (ref 26.0–34.0)
MCHC: 29.2 g/dL — AB (ref 30.0–36.0)
MCV: 94.4 fL (ref 78.0–100.0)
Platelets: 306 10*3/uL (ref 150–400)
RBC: 3.01 MIL/uL — ABNORMAL LOW (ref 4.22–5.81)
RDW: 18.2 % — ABNORMAL HIGH (ref 11.5–15.5)
WBC: 7.2 10*3/uL (ref 4.0–10.5)

## 2016-05-07 LAB — RENAL FUNCTION PANEL
ALBUMIN: 3.4 g/dL — AB (ref 3.5–5.0)
Anion gap: 19 — ABNORMAL HIGH (ref 5–15)
BUN: 49 mg/dL — ABNORMAL HIGH (ref 6–20)
CO2: 19 mmol/L — ABNORMAL LOW (ref 22–32)
Calcium: 9.4 mg/dL (ref 8.9–10.3)
Chloride: 94 mmol/L — ABNORMAL LOW (ref 101–111)
Creatinine, Ser: 8.13 mg/dL — ABNORMAL HIGH (ref 0.61–1.24)
GFR calc non Af Amer: 6 mL/min — ABNORMAL LOW (ref >60)
GFR, EST AFRICAN AMERICAN: 8 mL/min — AB (ref >60)
GLUCOSE: 80 mg/dL (ref 65–99)
PHOSPHORUS: 7.5 mg/dL — AB (ref 2.5–4.6)
POTASSIUM: 5.7 mmol/L — AB (ref 3.5–5.1)
Sodium: 132 mmol/L — ABNORMAL LOW (ref 135–145)

## 2016-05-07 LAB — HEPATITIS B SURFACE ANTIGEN: Hepatitis B Surface Ag: NEGATIVE

## 2016-05-07 SURGERY — IMAGING PROCEDURE, GI TRACT, INTRALUMINAL, VIA CAPSULE
Anesthesia: LOCAL

## 2016-05-07 MED ORDER — SODIUM CHLORIDE 0.9 % IV SOLN: 100.0000 mL | INTRAVENOUS | Status: DC | PRN

## 2016-05-07 MED ORDER — ALTEPLASE 2 MG IJ SOLR: 2.0000 mg | Freq: Once | INTRAMUSCULAR | Status: DC | PRN

## 2016-05-07 MED ORDER — LIDOCAINE HCL (PF) 1 % IJ SOLN: 5.0000 mL | INTRAMUSCULAR | Status: DC | PRN

## 2016-05-07 MED ORDER — OXYCODONE-ACETAMINOPHEN 5-325 MG PO TABS
ORAL_TABLET | ORAL | Status: AC
  Filled 2016-05-07: qty 2

## 2016-05-07 MED ORDER — DOXERCALCIFEROL 4 MCG/2ML IV SOLN
7.0000 ug | INTRAVENOUS | Status: DC
  Administered 2016-05-07 – 2016-05-10 (×2): 7 ug via INTRAVENOUS
  Filled 2016-05-07: qty 4

## 2016-05-07 MED ORDER — DOXERCALCIFEROL 4 MCG/2ML IV SOLN
INTRAVENOUS | Status: AC
  Administered 2016-05-07: 7 ug via INTRAVENOUS
  Filled 2016-05-07: qty 4

## 2016-05-07 MED ORDER — DARBEPOETIN ALFA 60 MCG/0.3ML IJ SOSY
PREFILLED_SYRINGE | INTRAMUSCULAR | Status: AC
  Administered 2016-05-07: 60 ug via INTRAVENOUS
  Filled 2016-05-07: qty 0.3

## 2016-05-07 MED ORDER — HEPARIN SODIUM (PORCINE) 1000 UNIT/ML DIALYSIS: 1000.0000 [IU] | INTRAMUSCULAR | Status: DC | PRN

## 2016-05-07 MED ORDER — DARBEPOETIN ALFA 60 MCG/0.3ML IJ SOSY
60.0000 ug | PREFILLED_SYRINGE | INTRAMUSCULAR | Status: DC
  Administered 2016-05-07: 60 ug via INTRAVENOUS

## 2016-05-07 MED ORDER — LIDOCAINE-PRILOCAINE 2.5-2.5 % EX CREA: 1.0000 "application " | TOPICAL_CREAM | CUTANEOUS | Status: DC | PRN

## 2016-05-07 MED ORDER — PENTAFLUOROPROP-TETRAFLUOROETH EX AERO: 1.0000 "application " | INHALATION_SPRAY | CUTANEOUS | Status: DC | PRN

## 2016-05-07 SURGICAL SUPPLY — 1 items: TOWEL COTTON PACK 4EA (MISCELLANEOUS) ×4 IMPLANT

## 2016-05-07 NOTE — Progress Notes (Signed)
MEDICATION RELATED CONSULT NOTE - INITIAL   Pharmacy Consult for Aranesp Indication: anemia of ESRD  Labs:  Recent Labs  05/05/16 1813 05/05/16 2351 05/06/16 0635 05/06/16 1507 05/07/16 0907  WBC 7.3  --   --   --  7.2  HGB 8.2* 8.4* 8.1* 8.0* 8.3*  HCT 25.7* 28.3* 27.5* 27.4* 28.4*  PLT 279  --   --   --  306  CREATININE 5.30* 5.93*  --   --   --   MG  --  2.2  --   --   --   ALBUMIN 3.5 3.3*  --   --   --   PROT 7.3 7.0  --   --   --   AST 26 23  --   --   --   ALT 24 23  --   --   --   ALKPHOS 166* 159*  --   --   --   BILITOT 1.5* 1.7*  --   --   --    Assessment: 56yo M with ESRD on HD TTS. Receives EPO 6000 units qHD outpatient. Pharmacy has been consulted to switch to Aranesp while inpatient. Hgb 8.3 today. Last iron studies from 03/23/15    Plan:  Aranesp 60 mcg IV qSat HD Monitor Hgb, next dose due 3/3   Gwenlyn Perking, PharmD PGY1 Pharmacy Resident Pager: [phone] 05/07/2016 9:48 AM

## 2016-05-07 NOTE — Progress Notes (Signed)
Patient ingested givens capsule for small bowel endoscopy study today at [phone]. He will wear equipment until 1834 this evening. Written instructions given to patient and primary RN. Education provided regarding equipment use and PO intake. Patient and primary RN express understanding.

## 2016-05-07 NOTE — Progress Notes (Signed)
[facility] Progress Note   Subjective: wants to get off HD early due to cramping  Vitals:   05/07/16 0930 05/07/16 1000 05/07/16 1015 05/07/16 1034  BP: 117/75 114/73 110/69 118/78  Pulse: 62 66 64 78  Resp:    12  Temp:    97.9 F (36.6 C)  TempSrc:    Oral  SpO2:    96%  Weight:    83.5 kg (184 lb 1.4 oz)  Height:        Inpatient medications: . amLODipine  5 mg Oral Daily  . atorvastatin  40 mg Oral q1800  . calcium acetate  2,001 mg Oral TID WC  . cinacalcet  30 mg Oral Q supper  . darbepoetin (ARANESP) injection - DIALYSIS  60 mcg Intravenous Q Sat-HD  . doxercalciferol  7 mcg Intravenous Q T,Th,Sa-HD  . gabapentin  300 mg Oral QHS  . labetalol  100 mg Oral Daily  . mometasone-formoterol  2 puff Inhalation BID  . multivitamin  1 tablet Oral QHS  . pantoprazole (PROTONIX) IV  40 mg Intravenous Q12H  . sodium chloride flush  3 mL Intravenous Q12H  . sodium chloride flush  3 mL Intravenous Q12H    sodium chloride, sodium chloride, sodium chloride, acetaminophen **OR** acetaminophen, alteplase, heparin, lidocaine (PF), lidocaine-prilocaine, morphine injection, ondansetron **OR** ondansetron (ZOFRAN) IV, oxyCODONE-acetaminophen, pentafluoroprop-tetrafluoroeth, sodium chloride flush  Exam: Gen alert, no distress No jvd or bruits Chest clear bilat RRR no MRG, +sternotomy scar Abd soft ntnd no mass or ascites +bs Ext no LE edema / no wounds or ulcers Neuro is alert, Ox 3 , nf  RUA AVF +bruit    Dialysis: TTS [facility]  3h 4min   82.5kg   1K/2.5 bath   Hep none       RUA AVF (w several stents, can be a "tough stick")  - EPO 6000 tiw  - Hect 7 ug tiw   Assessment: 1. GI bleed/ melena - per primary/ GI 2. ESRD HD TTS 3. Vol close to dry 4. Hx cirrhosis/ recur ascites 5. Anemia - on EPO at outpt HD 6. HTN on norvasc/ labetolol 7. MBD cont phoslo/ sensipar  Plan - HD today, UF to dry wt   Kelly Splinter MD [facility] pager [phone]   05/07/2016, 11:14 AM    Recent Labs Lab 05/05/16 1813 05/05/16 2351 05/07/16 0907  NA 133* 133* 132*  K 3.1* 4.0 5.7*  CL 93* 96* 94*  CO2 28 24 19*  GLUCOSE 140* 92 80  BUN 33* 35* 49*  CREATININE 5.30* 5.93* 8.13*  CALCIUM 9.1 9.7 9.4  PHOS  --   --  7.5*    Recent Labs Lab 05/05/16 1813 05/05/16 2351 05/07/16 0907  AST 26 23  --   ALT 24 23  --   ALKPHOS 166* 159*  --   BILITOT 1.5* 1.7*  --   PROT 7.3 7.0  --   ALBUMIN 3.5 3.3* 3.4*    Recent Labs Lab 05/05/16 1813  05/06/16 0635 05/06/16 1507 05/07/16 0907  WBC 7.3  --   --   --  7.2  HGB 8.2*  < > 8.1* 8.0* 8.3*  HCT 25.7*  < > 27.5* 27.4* 28.4*  MCV 89.3  --   --   --  94.4  PLT 279  --   --   --  306  < > = values in this interval not displayed. Iron/TIBC/Ferritin/ %Sat    Component  Value Date/Time   IRON 24 (L) 03/23/2015 1136   TIBC 379 03/23/2015 1136   FERRITIN 136 03/23/2015 1136   IRONPCTSAT 6 (L) 03/23/2015 1136

## 2016-05-07 NOTE — Progress Notes (Signed)
PROGRESS NOTE        PATIENT DETAILS Name: Stanley [name] Age: 56 y.o. Sex: male Date of Birth: 23-May-1960 Admit Date: 05/05/2016 Admitting Physician Vianne Bulls, MD [id number] Ladoris Gene, MD  Brief Narrative: Patient is a 56 y.o. male with history of ESRD on hemodialysis, recurrent melena intermittently over the past several months with negative endoscopic workup (except for AVM's) at [facility]-history admitted for melena, GI consulted-workup in progress. See details below.  Subjective: Had melanotic stool last evening. Lying comfortably in bed-seen earlier today at hemodialysis.  Assessment/Plan: Melena: Likely due to small bowel bleeding-recent EGD on 2/4 was negative. Has known history of AVMs per chart review. Capsule endoscopy currently in progress, await further recommendations from gastroenterology. Avoid NSAIDs/aspirin given recurrent GI bleeding.  Acute/subacute on chronic blood loss anemia: Continue to follow hemoglobin and transfuse accordingly.  ESRD: Nephrology following-HD TTS.  Recurrent ascites: Likely multifactorial from known liver cirrhosis and ESRD. Limited abdominal ultrasound on 2/23 showed very small volume of ascites-with insufficient amount for paracentesis.  History of alcoholic liver cirrhosis/remote alcohol use: Compensated-no signs of withdrawal  Hypertension: Controlled, continue labetalol, amlodipine  GERD: Continue PPI  Hx of asthma: Stable-clear lungs-continue Symbicort and as needed albuterol.  DVT Prophylaxis: SCD  Code Status: Full code  Family Communication: None at bedside  Disposition Plan: Remain inpatient-requires several days of hospitalization prior to discharge.  Antimicrobial agents: Anti-infectives    None     Procedures: 2/24>>Capsule endoscopy  CONSULTS:  GI and nephrology  Time spent: 25- minutes-Greater than 50% of this time was spent in counseling, explanation of diagnosis, planning  of further management, and coordination of care.  MEDICATIONS: Scheduled Meds: . amLODipine  5 mg Oral Daily  . atorvastatin  40 mg Oral q1800  . calcium acetate  2,001 mg Oral TID WC  . cinacalcet  30 mg Oral Q supper  . darbepoetin (ARANESP) injection - DIALYSIS  60 mcg Intravenous Q Sat-HD  . doxercalciferol  7 mcg Intravenous Q T,Th,Sa-HD  . gabapentin  300 mg Oral QHS  . labetalol  100 mg Oral Daily  . mometasone-formoterol  2 puff Inhalation BID  . multivitamin  1 tablet Oral QHS  . pantoprazole (PROTONIX) IV  40 mg Intravenous Q12H  . sodium chloride flush  3 mL Intravenous Q12H  . sodium chloride flush  3 mL Intravenous Q12H   Continuous Infusions: PRN Meds:.sodium chloride, sodium chloride, sodium chloride, acetaminophen **OR** acetaminophen, alteplase, heparin, lidocaine (PF), lidocaine-prilocaine, morphine injection, ondansetron **OR** ondansetron (ZOFRAN) IV, oxyCODONE-acetaminophen, pentafluoroprop-tetrafluoroeth, sodium chloride flush   PHYSICAL EXAM: Vital signs: Vitals:   05/07/16 0930 05/07/16 1000 05/07/16 1015 05/07/16 1034  BP: 117/75 114/73 110/69 118/78  Pulse: 62 66 64 78  Resp:    12  Temp:    97.9 F (36.6 C)  TempSrc:    Oral  SpO2:    96%  Weight:      Height:       Filed Weights   05/06/16 0503 05/07/16 0546 05/07/16 0845  Weight: 83.9 kg (185 lb) 84.8 kg (186 lb 14.4 oz) 84.9 kg (187 lb 2.7 oz)   Body mass index is 23.39 kg/m.   General appearance :Awake, alert, not in any distress. Speech Clear. Eyes:, pupils equally reactive to light and accomodation,no scleral icterus. HEENT: Atraumatic and Normocephalic Neck: supple, no JVD. No cervical lymphadenopathy.  Resp:Good  air entry bilaterally, no added sounds  CVS: S1 S2 regular, no murmurs.  GI: Bowel sounds present, Non tender and not distended with no gaurding, rigidity or rebound.No organomegaly Extremities: B/L Lower Ext shows no edema, both legs are warm to touch Neurology:  speech  clear,Non focal, sensation is grossly intact. Psychiatric: Normal judgment and insight. Alert and oriented x 3. Normal mood. Musculoskeletal:No digital cyanosis Skin:No Rash, warm and dry Wounds:N/A  I have personally reviewed following labs and imaging studies  LABORATORY DATA: CBC:  Recent Labs Lab 05/05/16 1813 05/05/16 2351 05/06/16 0635 05/06/16 1507 05/07/16 0907  WBC 7.3  --   --   --  7.2  HGB 8.2* 8.4* 8.1* 8.0* 8.3*  HCT 25.7* 28.3* 27.5* 27.4* 28.4*  MCV 89.3  --   --   --  94.4  PLT 279  --   --   --  254    Basic Metabolic Panel:  Recent Labs Lab 05/05/16 1813 05/05/16 2351 05/07/16 0907  NA 133* 133* 132*  K 3.1* 4.0 5.7*  CL 93* 96* 94*  CO2 28 24 19*  GLUCOSE 140* 92 80  BUN 33* 35* 49*  CREATININE 5.30* 5.93* 8.13*  CALCIUM 9.1 9.7 9.4  MG  --  2.2  --   PHOS  --   --  7.5*    GFR: Estimated Creatinine Clearance: 12 mL/min (by C-G formula based on SCr of 8.13 mg/dL (H)).  Liver Function Tests:  Recent Labs Lab 05/05/16 1813 05/05/16 2351 05/07/16 0907  AST 26 23  --   ALT 24 23  --   ALKPHOS 166* 159*  --   BILITOT 1.5* 1.7*  --   PROT 7.3 7.0  --   ALBUMIN 3.5 3.3* 3.4*   No results for input(s): LIPASE, AMYLASE in the last 168 hours. No results for input(s): AMMONIA in the last 168 hours.  Coagulation Profile:  Recent Labs Lab 05/05/16 1932 05/05/16 2351  INR 1.13 1.22    Cardiac Enzymes: No results for input(s): CKTOTAL, CKMB, CKMBINDEX, TROPONINI in the last 168 hours.  BNP (last 3 results) No results for input(s): PROBNP in the last 8760 hours.  HbA1C: No results for input(s): HGBA1C in the last 72 hours.  CBG:  Recent Labs Lab 05/05/16 2125  GLUCAP 84    Lipid Profile: No results for input(s): CHOL, HDL, LDLCALC, TRIG, CHOLHDL, LDLDIRECT in the last 72 hours.  Thyroid Function Tests: No results for input(s): TSH, T4TOTAL, FREET4, T3FREE, THYROIDAB in the last 72 hours.  Anemia Panel: No results for  input(s): VITAMINB12, FOLATE, FERRITIN, TIBC, IRON, RETICCTPCT in the last 72 hours.  Urine analysis:    Component Value Date/Time   COLORURINE YELLOW (A) 05/03/2015 0815   APPEARANCEUR CLEAR (A) 05/03/2015 0815   APPEARANCEUR Clear 11/13/2013 1314   LABSPEC 1.008 05/03/2015 0815   LABSPEC 1.014 11/13/2013 1314   PHURINE 9.0 (H) 05/03/2015 0815   GLUCOSEU NEGATIVE 05/03/2015 0815   GLUCOSEU 50 mg/dL 11/13/2013 1314   HGBUR NEGATIVE 05/03/2015 0815   BILIRUBINUR NEGATIVE 05/03/2015 0815   BILIRUBINUR Negative 11/13/2013 1314   KETONESUR NEGATIVE 05/03/2015 0815   PROTEINUR 100 (A) 05/03/2015 0815   UROBILINOGEN 0.2 06/22/2014 1230   NITRITE NEGATIVE 05/03/2015 0815   LEUKOCYTESUR NEGATIVE 05/03/2015 0815   LEUKOCYTESUR Negative 11/13/2013 1314    Sepsis Labs: Lactic Acid, Venous    Component Value Date/Time   LATICACIDVEN 1.3 04/06/2015 0134    MICROBIOLOGY: Recent Results (from the past 240 hour(s))  Body fluid culture     Status: None (Preliminary result)   Collection Time: 05/05/16  7:34 PM  Result Value Ref Range Status   Specimen Description ASCITIES  Final   Special Requests NONE  Final   Gram Stain   Final    RARE WBC PRESENT, PREDOMINANTLY MONONUCLEAR NO ORGANISMS SEEN    Culture   Final    NO GROWTH 1 DAY Performed at [facility], [place] [address], [place], [place] [address]    Report Status PENDING  Incomplete    RADIOLOGY STUDIES/RESULTS: Dg Ribs Unilateral W/chest Right  Result Date: 04/21/2016 CLINICAL DATA:  Syncope during dialysis, slipped in shower this morning and fell striking RIGHT lateral ribs, history hypertension, coronary disease, cirrhosis, end- stage renal disease alcohol and drug abuse EXAM: RIGHT RIBS AND CHEST - 3+ VIEW COMPARISON:  Chest radiograph 03/20/2016 FINDINGS: Enlargement of cardiac silhouette post CABG with pulmonary vascular congestion Atherosclerotic calcification aorta. Minimal chronic accentuation of interstitial  markings, stable. No acute failure or consolidation. No pleural effusion or pneumothorax. Osseous mineralization grossly normal. BB placed at site of symptoms lower lateral RIGHT chest. Nondisplaced fractures of the lateral RIGHT ninth tenth ribs identified. IMPRESSION: Enlargement of cardiac silhouette with pulmonary vascular congestion post CABG. No acute RIGHT infiltrate or pneumothorax. Nondisplaced fractures lateral RIGHT ninth and tenth ribs. Electronically Signed   By: Lavonia Dana M.D.   On: 04/21/2016 15:56   US Abdomen Limited  Result Date: 05/06/2016 CLINICAL DATA:  56 year old male with cirrhosis and end-stage renal disease. Abdominal pain. EXAM: LIMITED ABDOMEN ULTRASOUND FOR ASCITES TECHNIQUE: Limited ultrasound survey for ascites was performed in all four abdominal quadrants. COMPARISON:  03/09/2016. FINDINGS: Hepatomegaly and small volume of ascites fluid is identified in the 4 quadrants of the abdomen. The quantity is less than that on 03/09/2016 and insufficient for paracentesis. IMPRESSION: Small volume of ascites, quantity insufficient for paracentesis. Electronically Signed   By: Genevie Ann M.D.   On: 05/06/2016 11:16   US Venous Img Lower Unilateral Left  Result Date: 04/16/2016 CLINICAL DATA:  Left leg pain and swelling for 1 day. Concern for DVT. EXAM: LEFT LOWER EXTREMITY VENOUS DOPPLER ULTRASOUND TECHNIQUE: Gray-scale sonography with graded compression, as well as color Doppler and duplex ultrasound were performed to evaluate the lower extremity deep venous systems from the level of the common femoral vein and including the common femoral, femoral, profunda femoral, popliteal and calf veins including the posterior tibial, peroneal and gastrocnemius veins when visible. The superficial great saphenous vein was also interrogated. Spectral Doppler was utilized to evaluate flow at rest and with distal augmentation maneuvers in the common femoral, femoral and popliteal veins. COMPARISON:   Lower extremity duplex 06/18/2011 FINDINGS: Contralateral Common Femoral Vein: Respiratory phasicity is normal and symmetric with the symptomatic side. No evidence of thrombus. Normal compressibility. Common Femoral Vein: No evidence of thrombus. Normal compressibility, respiratory phasicity and response to augmentation. Saphenofemoral Junction: No evidence of thrombus. Normal compressibility and flow on color Doppler imaging. Profunda Femoral Vein: No evidence of thrombus. Normal compressibility and flow on color Doppler imaging. Femoral Vein: No evidence of thrombus. Normal compressibility, respiratory phasicity and response to augmentation. Popliteal Vein: No evidence of thrombus. Normal compressibility, respiratory phasicity and response to augmentation. Calf Veins: No evidence of thrombus. Normal compressibility and flow on color Doppler imaging. Superficial Great Saphenous Vein: No evidence of thrombus. Normal compressibility and flow on color Doppler imaging. Venous Reflux:  None. Other Findings: Subcutaneous edema noted about the popliteal area  and calf. IMPRESSION: No evidence of left lower extremity deep venous thrombosis. Soft tissue edema of the calf. Electronically Signed   By: Jeb Levering M.D.   On: 04/16/2016 01:16     LOS: 2 days   Oren Binet, MD  [facility] Pager:[phone]  If 7PM-7AM, please contact night-coverage [email or web address] Password [facility] 05/07/2016, 10:54 AM

## 2016-05-08 DIAGNOSIS — D500 Iron deficiency anemia secondary to blood loss (chronic): Secondary | ICD-10-CM

## 2016-05-08 DIAGNOSIS — N186 End stage renal disease: Secondary | ICD-10-CM

## 2016-05-08 DIAGNOSIS — Z992 Dependence on renal dialysis: Secondary | ICD-10-CM

## 2016-05-08 LAB — CBC
HCT: 27.6 % — ABNORMAL LOW (ref 39.0–52.0)
Hemoglobin: 8 g/dL — ABNORMAL LOW (ref 13.0–17.0)
MCH: 27.1 pg (ref 26.0–34.0)
MCHC: 29 g/dL — ABNORMAL LOW (ref 30.0–36.0)
MCV: 93.6 fL (ref 78.0–100.0)
PLATELETS: 296 10*3/uL (ref 150–400)
RBC: 2.95 MIL/uL — ABNORMAL LOW (ref 4.22–5.81)
RDW: 18 % — AB (ref 11.5–15.5)
WBC: 6.5 10*3/uL (ref 4.0–10.5)

## 2016-05-08 LAB — RENAL FUNCTION PANEL
Albumin: 3.4 g/dL — ABNORMAL LOW (ref 3.5–5.0)
Anion gap: 15 (ref 5–15)
BUN: 36 mg/dL — AB (ref 6–20)
CHLORIDE: 93 mmol/L — AB (ref 101–111)
CO2: 25 mmol/L (ref 22–32)
CREATININE: 7.31 mg/dL — AB (ref 0.61–1.24)
Calcium: 9.5 mg/dL (ref 8.9–10.3)
GFR calc Af Amer: 9 mL/min — ABNORMAL LOW (ref >60)
GFR, EST NON AFRICAN AMERICAN: 7 mL/min — AB (ref >60)
Glucose, Bld: 122 mg/dL — ABNORMAL HIGH (ref 65–99)
Phosphorus: 7.3 mg/dL — ABNORMAL HIGH (ref 2.5–4.6)
Potassium: 4.7 mmol/L (ref 3.5–5.1)
Sodium: 133 mmol/L — ABNORMAL LOW (ref 135–145)

## 2016-05-08 MED ORDER — ALUM & MAG HYDROXIDE-SIMETH 200-200-20 MG/5ML PO SUSP
15.0000 mL | ORAL | Status: DC | PRN
  Administered 2016-05-08: 15 mL via ORAL
  Filled 2016-05-08: qty 30

## 2016-05-08 MED ORDER — PEG-KCL-NACL-NASULF-NA ASC-C 100 G PO SOLR
0.5000 | Freq: Once | ORAL | Status: AC
  Administered 2016-05-08: 100 g via ORAL
  Filled 2016-05-08: qty 1

## 2016-05-08 MED ORDER — MENTHOL 3 MG MT LOZG
1.0000 | LOZENGE | OROMUCOSAL | Status: DC | PRN
  Administered 2016-05-08: 3 mg via ORAL
  Filled 2016-05-08: qty 9

## 2016-05-08 MED ORDER — SODIUM CHLORIDE 0.9 % IV SOLN
INTRAVENOUS | Status: DC
  Administered 2016-05-08: 21:00:00 via INTRAVENOUS

## 2016-05-08 MED ORDER — METOCLOPRAMIDE HCL 5 MG/ML IJ SOLN
5.0000 mg | Freq: Four times a day (QID) | INTRAMUSCULAR | Status: AC
  Administered 2016-05-08 – 2016-05-09 (×3): 5 mg via INTRAVENOUS
  Filled 2016-05-08 (×3): qty 2

## 2016-05-08 NOTE — Progress Notes (Signed)
[facility] Progress Note  Subjective:  WCE showed bleeding in his proximal small bowel.  No clinical signs of overt GI bleeding.  Hgb stable at 8.0 grams this AM.  Just finished eating lunch.  Objective:  Vital signs in last 24 hours: Temp:  [97.5 F (36.4 C)-98.7 F (37.1 C)] 97.5 F (36.4 C) (02/25 0511) Pulse Rate:  [60-71] 60 (02/25 0511) Resp:  [16-18] 18 (02/25 0511) BP: (113-132)/(61-80) 132/75 (02/25 0511) SpO2:  [96 %-99 %] 99 % (02/25 0511) Weight:  [187 lb 14.4 oz (85.2 kg)] 187 lb 14.4 oz (85.2 kg) (02/25 0511) Last BM Date: 05/07/16 General:  Alert, Well-developed, in NAD Heart:  Regular rate and rhythm; no murmurs Pulm:  CTAB.  No increased WOB. Abdomen:  Soft, distended from ascites fluid.  Non-tender.   Extremities:  Without edema. Neurologic:  Alert and oriented x 4;  grossly normal neurologically. Psych:  Alert and cooperative. Normal mood and affect.  Intake/Output from previous day: 02/24 0701 - 02/25 0700 In: 840 [P.O.:840] Out: 739   Lab Results:  Recent Labs  05/05/16 1813  05/06/16 1507 05/07/16 0907 05/08/16 0300  WBC 7.3  --   --  7.2 6.5  HGB 8.2*  < > 8.0* 8.3* 8.0*  HCT 25.7*  < > 27.4* 28.4* 27.6*  PLT 279  --   --  306 296  < > = values in this interval not displayed. BMET  Recent Labs  05/05/16 2351 05/07/16 0907 05/08/16 0300  NA 133* 132* 133*  K 4.0 5.7* 4.7  CL 96* 94* 93*  CO2 24 19* 25  GLUCOSE 92 80 122*  BUN 35* 49* 36*  CREATININE 5.93* 8.13* 7.31*  CALCIUM 9.7 9.4 9.5   LFT  Recent Labs  05/05/16 2351  05/08/16 0300  PROT 7.0  --   --   ALBUMIN 3.3*  < > 3.4*  AST 23  --   --   ALT 23  --   --   ALKPHOS 159*  --   --   BILITOT 1.7*  --   --   < > = values in this interval not displayed. PT/INR  Recent Labs  05/05/16 1932 05/05/16 2351  LABPROT 14.6 15.5*  INR 1.13 1.22   Hepatitis Panel  Recent Labs  05/07/16 0926  HEPBSAG Negative   Assessment /  Plan: Melena  Anemia of acute on chronic GI blood loss. There is recurrent obscure small bowel GI blood loss on top of anemia of chronic kidney disease.  Hgb stable this AM.  Alcohol related cirrhosis, now abstinent from alcohol by his report. He has ascites which is from both portal hypertension and also chronic kidney disease. He does not have over 500 WBCs or 250 PMNs on, so he does not have SBP.  Generalized abdominal pain from ascites  *WCE showed bleeding source in proximal small bowel.  Needs enteroscopy 2/26.  Clear liquids today then NPO after midnight.  Will give one bottle of Moviprep this evening since prep for WCE was poor.  Also will give Reglan 5 mg IV x 3 to help clear his stomach as well.   LOS: 3 days   ZEHR, JESSICA D.  05/08/2016, 12:15 PM  Pager number [phone]   I have discussed the case with the PA, and that is the plan I formulated. I personally interviewed and examined the patient.  SB enteroscopy tomorrow to hopefully find and treat this bleeding source.    Mallie Mussel  L Danis III Pager [phone]  Mon-Fri 8a-5p [phone] after 5p, weekends, holidays

## 2016-05-08 NOTE — Progress Notes (Signed)
[facility] Progress Note   Subjective: no c/o, awaiting results of capsule endo. Hb 8.0 this am.   Vitals:   05/07/16 2019 05/08/16 0232 05/08/16 0241 05/08/16 0511  BP: 113/61   132/75  Pulse: 63   60  Resp: 16   18  Temp: 97.6 F (36.4 C)   97.5 F (36.4 C)  TempSrc: Oral   Oral  SpO2: 99% 96% 98% 99%  Weight:    85.2 kg (187 lb 14.4 oz)  Height:        Inpatient medications: . amLODipine  5 mg Oral Daily  . atorvastatin  40 mg Oral q1800  . calcium acetate  2,001 mg Oral TID WC  . cinacalcet  30 mg Oral Q supper  . darbepoetin (ARANESP) injection - DIALYSIS  60 mcg Intravenous Q Sat-HD  . doxercalciferol  7 mcg Intravenous Q T,Th,Sa-HD  . gabapentin  300 mg Oral QHS  . labetalol  100 mg Oral Daily  . mometasone-formoterol  2 puff Inhalation BID  . multivitamin  1 tablet Oral QHS  . pantoprazole (PROTONIX) IV  40 mg Intravenous Q12H  . sodium chloride flush  3 mL Intravenous Q12H  . sodium chloride flush  3 mL Intravenous Q12H    sodium chloride, acetaminophen **OR** acetaminophen, menthol-cetylpyridinium, morphine injection, ondansetron **OR** ondansetron (ZOFRAN) IV, oxyCODONE-acetaminophen, sodium chloride flush  Exam: Gen alert, no distress No jvd or bruits Chest clear bilat RRR no MRG, +sternotomy scar Abd soft ntnd no mass or ascites +bs Ext no LE edema / no wounds or ulcers Neuro is alert, Ox 3 , nf  RUA AVF +bruit    Dialysis: TTS [facility]  3h 32min   82.5kg   1K/2.5 bath   Hep none       RUA AVF (w several stents, can be a "tough stick")  - EPO 6000 tiw  - Hect 7 ug tiw   Assessment: 1. GI bleed/ melena - Hb 8, hx or recent neg EGD, now sp capsule endo, results pend 2. ESRD HD TTS 3. Vol is up 2-3kg 4. Hx cirrhosis/ recur ascites 5. Anemia - on EPO at outpt HD 6. HTN on norvasc/ labetolol 7. MBD cont phoslo/ sensipar  Plan - cont HD TTS   Kelly Splinter MD [facility] pager [phone]    05/08/2016, 12:04 PM    Recent Labs Lab 05/05/16 2351 05/07/16 0907 05/08/16 0300  NA 133* 132* 133*  K 4.0 5.7* 4.7  CL 96* 94* 93*  CO2 24 19* 25  GLUCOSE 92 80 122*  BUN 35* 49* 36*  CREATININE 5.93* 8.13* 7.31*  CALCIUM 9.7 9.4 9.5  PHOS  --  7.5* 7.3*    Recent Labs Lab 05/05/16 1813 05/05/16 2351 05/07/16 0907 05/08/16 0300  AST 26 23  --   --   ALT 24 23  --   --   ALKPHOS 166* 159*  --   --   BILITOT 1.5* 1.7*  --   --   PROT 7.3 7.0  --   --   ALBUMIN 3.5 3.3* 3.4* 3.4*    Recent Labs Lab 05/05/16 1813  05/06/16 1507 05/07/16 0907 05/08/16 0300  WBC 7.3  --   --  7.2 6.5  HGB 8.2*  < > 8.0* 8.3* 8.0*  HCT 25.7*  < > 27.4* 28.4* 27.6*  MCV 89.3  --   --  94.4 93.6  PLT 279  --   --  306 296  < > =  values in this interval not displayed. Iron/TIBC/Ferritin/ %Sat    Component Value Date/Time   IRON 24 (L) 03/23/2015 1136   TIBC 379 03/23/2015 1136   FERRITIN 136 03/23/2015 1136   IRONPCTSAT 6 (L) 03/23/2015 1136

## 2016-05-08 NOTE — Progress Notes (Signed)
PROGRESS NOTE        PATIENT DETAILS Name: Stanley [name] Age: 56 y.o. Sex: male Date of Birth: 12-05-1960 Admit Date: 05/05/2016 Admitting Physician Vianne Bulls, MD [id number] Ladoris Gene, MD  Brief Narrative: Patient is a 56 y.o. male with history of ESRD on hemodialysis, recurrent melena intermittently over the past several months with negative endoscopic workup (except for AVM's) at [facility]-history admitted for melena, GI consulted-workup in progress. See details below.  Subjective:  Lying comfortably in bed-claims still with melena-last BM was yesterday afternoon  Assessment/Plan: Melena: Likely due to small bowel bleeding-recent EGD on 2/4 was negative. Has known history of AVMs per chart review. Capsule endoscopy completed on 2/24- await further recommendations from gastroenterology. Avoid NSAIDs/aspirin given recurrent GI bleeding.  Acute/subacute on chronic blood loss anemia:due to above.Hb stable- Continue to follow hemoglobin and transfuse accordingly.  ESRD: Nephrology following-HD TTS.  Recurrent ascites: Likely multifactorial from known liver cirrhosis and ESRD. Limited abdominal ultrasound on 2/23 showed very small volume of ascites-with insufficient amount for paracentesis.  History of alcoholic liver cirrhosis/remote alcohol use: Compensated-no signs of withdrawal  Hypertension: Controlled, continue labetalol, amlodipine  GERD: Continue PPI  Hx of asthma: Stable-clear lungs-continue Symbicort and as needed albuterol.  DVT Prophylaxis: SCD  Code Status: Full code  Family Communication: None at bedside  Disposition Plan: Remain inpatient-requires several days of hospitalization prior to discharge.  Antimicrobial agents: Anti-infectives    None     Procedures: 2/24>>Capsule endoscopy  CONSULTS:  GI and nephrology  Time spent: 25- minutes-Greater than 50% of this time was spent in counseling, explanation of diagnosis,  planning of further management, and coordination of care.  MEDICATIONS: Scheduled Meds: . amLODipine  5 mg Oral Daily  . atorvastatin  40 mg Oral q1800  . calcium acetate  2,001 mg Oral TID WC  . cinacalcet  30 mg Oral Q supper  . darbepoetin (ARANESP) injection - DIALYSIS  60 mcg Intravenous Q Sat-HD  . doxercalciferol  7 mcg Intravenous Q T,Th,Sa-HD  . gabapentin  300 mg Oral QHS  . labetalol  100 mg Oral Daily  . mometasone-formoterol  2 puff Inhalation BID  . multivitamin  1 tablet Oral QHS  . pantoprazole (PROTONIX) IV  40 mg Intravenous Q12H  . sodium chloride flush  3 mL Intravenous Q12H  . sodium chloride flush  3 mL Intravenous Q12H   Continuous Infusions: PRN Meds:.sodium chloride, acetaminophen **OR** acetaminophen, menthol-cetylpyridinium, morphine injection, ondansetron **OR** ondansetron (ZOFRAN) IV, oxyCODONE-acetaminophen, sodium chloride flush   PHYSICAL EXAM: Vital signs: Vitals:   05/07/16 2019 05/08/16 0232 05/08/16 0241 05/08/16 0511  BP: 113/61   132/75  Pulse: 63   60  Resp: 16   18  Temp: 97.6 F (36.4 C)   97.5 F (36.4 C)  TempSrc: Oral   Oral  SpO2: 99% 96% 98% 99%  Weight:    85.2 kg (187 lb 14.4 oz)  Height:       Filed Weights   05/07/16 0845 05/07/16 1034 05/08/16 0511  Weight: 84.9 kg (187 lb 2.7 oz) 83.5 kg (184 lb 1.4 oz) 85.2 kg (187 lb 14.4 oz)   Body mass index is 23.49 kg/m.   General appearance :Awake, alert, not in any distress. Speech Clear. Eyes:, pupils equally reactive to light and accomodation,no scleral icterus. HEENT: Atraumatic and Normocephalic Neck: supple, no JVD. No cervical  lymphadenopathy.  Resp:Good air entry bilaterally, no added sounds  CVS: S1 S2 regular, no murmurs.  GI: Bowel sounds present, Non tender and not distended with no gaurding, rigidity or rebound.No organomegaly Extremities: B/L Lower Ext shows no edema, both legs are warm to touch Neurology:  speech clear,Non focal, sensation is grossly  intact. Psychiatric: Normal judgment and insight. Alert and oriented x 3. Normal mood. Musculoskeletal:No digital cyanosis Skin:No Rash, warm and dry Wounds:N/A  I have personally reviewed following labs and imaging studies  LABORATORY DATA: CBC:  Recent Labs Lab 05/05/16 1813 05/05/16 2351 05/06/16 0635 05/06/16 1507 05/07/16 0907 05/08/16 0300  WBC 7.3  --   --   --  7.2 6.5  HGB 8.2* 8.4* 8.1* 8.0* 8.3* 8.0*  HCT 25.7* 28.3* 27.5* 27.4* 28.4* 27.6*  MCV 89.3  --   --   --  94.4 93.6  PLT 279  --   --   --  306 967    Basic Metabolic Panel:  Recent Labs Lab 05/05/16 1813 05/05/16 2351 05/07/16 0907 05/08/16 0300  NA 133* 133* 132* 133*  K 3.1* 4.0 5.7* 4.7  CL 93* 96* 94* 93*  CO2 28 24 19* 25  GLUCOSE 140* 92 80 122*  BUN 33* 35* 49* 36*  CREATININE 5.30* 5.93* 8.13* 7.31*  CALCIUM 9.1 9.7 9.4 9.5  MG  --  2.2  --   --   PHOS  --   --  7.5* 7.3*    GFR: Estimated Creatinine Clearance: 13.3 mL/min (by C-G formula based on SCr of 7.31 mg/dL (H)).  Liver Function Tests:  Recent Labs Lab 05/05/16 1813 05/05/16 2351 05/07/16 0907 05/08/16 0300  AST 26 23  --   --   ALT 24 23  --   --   ALKPHOS 166* 159*  --   --   BILITOT 1.5* 1.7*  --   --   PROT 7.3 7.0  --   --   ALBUMIN 3.5 3.3* 3.4* 3.4*   No results for input(s): LIPASE, AMYLASE in the last 168 hours. No results for input(s): AMMONIA in the last 168 hours.  Coagulation Profile:  Recent Labs Lab 05/05/16 1932 05/05/16 2351  INR 1.13 1.22    Cardiac Enzymes: No results for input(s): CKTOTAL, CKMB, CKMBINDEX, TROPONINI in the last 168 hours.  BNP (last 3 results) No results for input(s): PROBNP in the last 8760 hours.  HbA1C: No results for input(s): HGBA1C in the last 72 hours.  CBG:  Recent Labs Lab 05/05/16 2125  GLUCAP 84    Lipid Profile: No results for input(s): CHOL, HDL, LDLCALC, TRIG, CHOLHDL, LDLDIRECT in the last 72 hours.  Thyroid Function Tests: No results  for input(s): TSH, T4TOTAL, FREET4, T3FREE, THYROIDAB in the last 72 hours.  Anemia Panel: No results for input(s): VITAMINB12, FOLATE, FERRITIN, TIBC, IRON, RETICCTPCT in the last 72 hours.  Urine analysis:    Component Value Date/Time   COLORURINE YELLOW (A) 05/03/2015 0815   APPEARANCEUR CLEAR (A) 05/03/2015 0815   APPEARANCEUR Clear 11/13/2013 1314   LABSPEC 1.008 05/03/2015 0815   LABSPEC 1.014 11/13/2013 1314   PHURINE 9.0 (H) 05/03/2015 0815   GLUCOSEU NEGATIVE 05/03/2015 0815   GLUCOSEU 50 mg/dL 11/13/2013 1314   HGBUR NEGATIVE 05/03/2015 0815   BILIRUBINUR NEGATIVE 05/03/2015 0815   BILIRUBINUR Negative 11/13/2013 1314   KETONESUR NEGATIVE 05/03/2015 0815   PROTEINUR 100 (A) 05/03/2015 0815   UROBILINOGEN 0.2 06/22/2014 1230   NITRITE NEGATIVE 05/03/2015 0815  LEUKOCYTESUR NEGATIVE 05/03/2015 0815   LEUKOCYTESUR Negative 11/13/2013 1314    Sepsis Labs: Lactic Acid, Venous    Component Value Date/Time   LATICACIDVEN 1.3 04/06/2015 0134    MICROBIOLOGY: Recent Results (from the past 240 hour(s))  Body fluid culture     Status: None (Preliminary result)   Collection Time: 05/05/16  7:34 PM  Result Value Ref Range Status   Specimen Description ASCITIES  Final   Special Requests NONE  Final   Gram Stain   Final    RARE WBC PRESENT, PREDOMINANTLY MONONUCLEAR NO ORGANISMS SEEN    Culture   Final    NO GROWTH 1 DAY Performed at [facility], [place] [address] [place], [place] [address]    Report Status PENDING  Incomplete    RADIOLOGY STUDIES/RESULTS: Dg Ribs Unilateral W/chest Right  Result Date: 04/21/2016 CLINICAL DATA:  Syncope during dialysis, slipped in shower this morning and fell striking RIGHT lateral ribs, history hypertension, coronary disease, cirrhosis, end- stage renal disease alcohol and drug abuse EXAM: RIGHT RIBS AND CHEST - 3+ VIEW COMPARISON:  Chest radiograph 03/20/2016 FINDINGS: Enlargement of cardiac silhouette post CABG with pulmonary  vascular congestion Atherosclerotic calcification aorta. Minimal chronic accentuation of interstitial markings, stable. No acute failure or consolidation. No pleural effusion or pneumothorax. Osseous mineralization grossly normal. BB placed at site of symptoms lower lateral RIGHT chest. Nondisplaced fractures of the lateral RIGHT ninth tenth ribs identified. IMPRESSION: Enlargement of cardiac silhouette with pulmonary vascular congestion post CABG. No acute RIGHT infiltrate or pneumothorax. Nondisplaced fractures lateral RIGHT ninth and tenth ribs. Electronically Signed   By: Lavonia Dana M.D.   On: 04/21/2016 15:56   US Abdomen Limited  Result Date: 05/06/2016 CLINICAL DATA:  56 year old male with cirrhosis and end-stage renal disease. Abdominal pain. EXAM: LIMITED ABDOMEN ULTRASOUND FOR ASCITES TECHNIQUE: Limited ultrasound survey for ascites was performed in all four abdominal quadrants. COMPARISON:  03/09/2016. FINDINGS: Hepatomegaly and small volume of ascites fluid is identified in the 4 quadrants of the abdomen. The quantity is less than that on 03/09/2016 and insufficient for paracentesis. IMPRESSION: Small volume of ascites, quantity insufficient for paracentesis. Electronically Signed   By: Genevie Ann M.D.   On: 05/06/2016 11:16   US Venous Img Lower Unilateral Left  Result Date: 04/16/2016 CLINICAL DATA:  Left leg pain and swelling for 1 day. Concern for DVT. EXAM: LEFT LOWER EXTREMITY VENOUS DOPPLER ULTRASOUND TECHNIQUE: Gray-scale sonography with graded compression, as well as color Doppler and duplex ultrasound were performed to evaluate the lower extremity deep venous systems from the level of the common femoral vein and including the common femoral, femoral, profunda femoral, popliteal and calf veins including the posterior tibial, peroneal and gastrocnemius veins when visible. The superficial great saphenous vein was also interrogated. Spectral Doppler was utilized to evaluate flow at rest and  with distal augmentation maneuvers in the common femoral, femoral and popliteal veins. COMPARISON:  Lower extremity duplex 06/18/2011 FINDINGS: Contralateral Common Femoral Vein: Respiratory phasicity is normal and symmetric with the symptomatic side. No evidence of thrombus. Normal compressibility. Common Femoral Vein: No evidence of thrombus. Normal compressibility, respiratory phasicity and response to augmentation. Saphenofemoral Junction: No evidence of thrombus. Normal compressibility and flow on color Doppler imaging. Profunda Femoral Vein: No evidence of thrombus. Normal compressibility and flow on color Doppler imaging. Femoral Vein: No evidence of thrombus. Normal compressibility, respiratory phasicity and response to augmentation. Popliteal Vein: No evidence of thrombus. Normal compressibility, respiratory phasicity and response to augmentation. Calf  Veins: No evidence of thrombus. Normal compressibility and flow on color Doppler imaging. Superficial Great Saphenous Vein: No evidence of thrombus. Normal compressibility and flow on color Doppler imaging. Venous Reflux:  None. Other Findings: Subcutaneous edema noted about the popliteal area and calf. IMPRESSION: No evidence of left lower extremity deep venous thrombosis. Soft tissue edema of the calf. Electronically Signed   By: Jeb Levering M.D.   On: 04/16/2016 01:16     LOS: 3 days   Oren Binet, MD  [facility] Pager:[phone]  If 7PM-7AM, please contact night-coverage [email or web address] Password [facility] 05/08/2016, 8:46 AM

## 2016-05-09 ENCOUNTER — Encounter (HOSPITAL_COMMUNITY)
Admission: AD | Disposition: A | Discharge status: Home / Self Care | Payer: Self-pay | Source: Other Acute Inpatient Hospital | Attending: Internal Medicine

## 2016-05-09 ENCOUNTER — Inpatient Hospital Stay (HOSPITAL_COMMUNITY): Payer: Medicare Other | Admitting: Certified Registered Nurse Anesthetist

## 2016-05-09 ENCOUNTER — Encounter (HOSPITAL_COMMUNITY): Payer: Self-pay | Admitting: Certified Registered Nurse Anesthetist

## 2016-05-09 DIAGNOSIS — K922 Gastrointestinal hemorrhage, unspecified: Secondary | ICD-10-CM

## 2016-05-09 DIAGNOSIS — K921 Melena: Secondary | ICD-10-CM

## 2016-05-09 HISTORY — PX: ESOPHAGOGASTRODUODENOSCOPY (EGD) WITH PROPOFOL: SHX5813

## 2016-05-09 LAB — CBC
HCT: 26 % — ABNORMAL LOW (ref 39.0–52.0)
HCT: 27.3 % — ABNORMAL LOW (ref 39.0–52.0)
HEMOGLOBIN: 7.7 g/dL — AB (ref 13.0–17.0)
HEMOGLOBIN: 8 g/dL — AB (ref 13.0–17.0)
MCH: 27.7 pg (ref 26.0–34.0)
MCH: 27.1 pg (ref 26.0–34.0)
MCHC: 29.6 g/dL — AB (ref 30.0–36.0)
MCHC: 29.3 g/dL — AB (ref 30.0–36.0)
MCV: 93.5 fL (ref 78.0–100.0)
MCV: 92.5 fL (ref 78.0–100.0)
Platelets: 273 10*3/uL (ref 150–400)
Platelets: 266 10*3/uL (ref 150–400)
RBC: 2.78 MIL/uL — AB (ref 4.22–5.81)
RBC: 2.95 MIL/uL — ABNORMAL LOW (ref 4.22–5.81)
RDW: 17.8 % — ABNORMAL HIGH (ref 11.5–15.5)
RDW: 17.7 % — ABNORMAL HIGH (ref 11.5–15.5)
WBC: 7.2 10*3/uL (ref 4.0–10.5)
WBC: 6.2 10*3/uL (ref 4.0–10.5)

## 2016-05-09 SURGERY — ESOPHAGOGASTRODUODENOSCOPY (EGD) WITH PROPOFOL
Anesthesia: Monitor Anesthesia Care

## 2016-05-09 MED ORDER — SODIUM CHLORIDE 0.9 % IV SOLN
INTRAVENOUS | Status: DC | PRN
  Administered 2016-05-09: 09:00:00 via INTRAVENOUS

## 2016-05-09 MED ORDER — METOCLOPRAMIDE HCL 5 MG/ML IJ SOLN
10.0000 mg | Freq: Four times a day (QID) | INTRAMUSCULAR | Status: AC
  Administered 2016-05-09: 10 mg via INTRAVENOUS
  Filled 2016-05-09: qty 2

## 2016-05-09 MED ORDER — BUTAMBEN-TETRACAINE-BENZOCAINE 2-2-14 % EX AERO
INHALATION_SPRAY | CUTANEOUS | Status: DC | PRN
  Administered 2016-05-09: 2 via TOPICAL

## 2016-05-09 MED ORDER — PROPOFOL 10 MG/ML IV BOLUS
INTRAVENOUS | Status: DC | PRN
  Administered 2016-05-09: 20 mg via INTRAVENOUS

## 2016-05-09 MED ORDER — PROPOFOL 500 MG/50ML IV EMUL
INTRAVENOUS | Status: DC | PRN
  Administered 2016-05-09: 100 ug/kg/min via INTRAVENOUS

## 2016-05-09 NOTE — Anesthesia Preprocedure Evaluation (Signed)
Anesthesia Evaluation  Patient identified by MRN, date of birth, ID band Patient awake    Reviewed: Allergy & Precautions, NPO status , Patient's Chart, lab work & pertinent test results, reviewed documented beta blocker date and time   History of Anesthesia Complications Negative for: history of anesthetic complications  Airway Mallampati: II  TM Distance: >3 FB Neck ROM: Full    Dental  (+) Poor Dentition, Missing   Pulmonary shortness of breath, with exertion and at rest, neg sleep apnea, neg COPD, Current Smoker,    breath sounds clear to auscultation- rhonchi (-) wheezing      Cardiovascular hypertension, Pt. on medications and Pt. on home beta blockers + CAD, + Past MI, + CABG and + Peripheral Vascular Disease  Normal cardiovascular exam Rhythm:Regular Rate:Normal - Systolic murmurs and - Diastolic murmurs Echo 06/23/85: - Left ventricle: The cavity size was mildly dilated. Systolic   function was normal. The estimated ejection fraction was in the   range of 50% to 55%. Wall motion was normal; there were no   regional wall motion abnormalities. Left ventricular diastolic   function parameters were normal. - Mitral valve: There was moderate regurgitation directed   eccentrically. - Left atrium: The atrium was moderately dilated. - Right ventricle: Systolic function was mildly reduced. - Right atrium: The atrium was mildly dilated. - Tricuspid valve: There was moderate-severe regurgitation. - Pulmonary arteries: Systolic pressure was moderately elevated. PA   peak pressure: 54 mm Hg (S).   Neuro/Psych Diabetic neuropathy  Neuromuscular disease negative psych ROS   GI/Hepatic GERD  Medicated and Controlled,(+) Cirrhosis   ascites  substance abuse  alcohol use, Bleeding from small bowel   Endo/Other  diabetes, Poorly Controlled, Type 2, Oral Hypoglycemic AgentsHyperlipidemia  Renal/GU ESRF and DialysisRenal disease      Musculoskeletal negative musculoskeletal ROS (+)   Abdominal (+) - obese,   Peds  Hematology  (+) anemia ,   Anesthesia Other Findings   Reproductive/Obstetrics                             Anesthesia Physical  Anesthesia Plan  ASA: IV  Anesthesia Plan: MAC   Post-op Pain Management:    Induction: Intravenous  Airway Management Planned: Natural Airway and Nasal Cannula  Additional Equipment:   Intra-op Plan:   Post-operative Plan:   Informed Consent: I have reviewed the patients History and Physical, chart, labs and discussed the procedure including the risks, benefits and alternatives for the proposed anesthesia with the patient or authorized representative who has indicated his/her understanding and acceptance.   Dental advisory given  Plan Discussed with: CRNA, Anesthesiologist and Surgeon  Anesthesia Plan Comments:         Anesthesia Quick Evaluation

## 2016-05-09 NOTE — Anesthesia Postprocedure Evaluation (Signed)
Anesthesia Post Note  Patient: Stanley [name]  Procedure(s) Performed: Procedure(s): ESOPHAGOGASTRODUODENOSCOPY (EGD) WITH PROPOFOL  Patient location during evaluation: PACU Anesthesia Type: MAC Level of consciousness: awake and alert and oriented Pain management: pain level controlled Vital Signs Assessment: post-procedure vital signs reviewed and stable Respiratory status: spontaneous breathing, nonlabored ventilation and respiratory function stable Cardiovascular status: stable and blood pressure returned to baseline Postop Assessment: no signs of nausea or vomiting Anesthetic complications: no       Last Vitals:  Vitals:   05/09/16 0915 05/09/16 0925  BP: 118/60 140/70  Pulse: 64 68  Resp: (!) 7 13  Temp:      Last Pain:  Vitals:   05/09/16 0905  TempSrc: Oral  PainSc:                  Tangia Pinard A.

## 2016-05-09 NOTE — Discharge Instructions (Signed)
Paracentesis, Care After °Introduction °Refer to this sheet in the next few weeks. These instructions provide you with information about caring for yourself after your procedure. Your health care provider may also give you more specific instructions. Your treatment has been planned according to current medical practices, but problems sometimes occur. Call your health care provider if you have any problems or questions after your procedure. °What can I expect after the procedure? °After your procedure, it is common to have a small amount of clear fluid coming from the puncture site. °Follow these instructions at home: °· Return to your normal activities as told by your health care provider. Ask your health care provider what activities are safe for you. °· Take over-the-counter and prescription medicines only as told by your health care provider. °· Do not take baths, swim, or use a hot tub until your health care provider approves. °· Follow instructions from your health care provider about: °¨ How to take care of your puncture site. °¨ When and how you should change your bandage (dressing). °¨ When you should remove your dressing. °· Check your puncture area every day signs of infection. Watch for: °¨ Redness, swelling, or pain. °¨ Fluid, blood, or pus. °· Keep all follow-up visits as told by your health care provider. This is important. °Contact a health care provider if: °· You have redness, swelling, or pain at your puncture site. °· You start to have more clear fluid coming from your puncture site. °· You have blood or pus coming from your puncture site. °· You have chills. °· You have a fever. °Get help right away if: °· You develop chest pain or shortness of breath. °· You develop increasing pain, discomfort, or swelling in your abdomen. °· You feel dizzy or light-headed or you pass out. °This information is not intended to replace advice given to you by your health care provider. Make sure you discuss any  questions you have with your health care provider. °Document Released: 07/15/2014 Document Revised: 08/06/2015 Document Reviewed: 05/13/2014 °© 2017 [employer]vier ° °

## 2016-05-09 NOTE — Progress Notes (Signed)
[facility] ROUNDING NOTE   Subjective:   Interval History:  ESRD TTS dialysis  GI Bleed for EGD  Known alcoholic liver disease  Objective:  Vital signs in last 24 hours:  Temp:  [97.6 F (36.4 C)-98.8 F (37.1 C)] 97.6 F (36.4 C) (02/26 0905) Pulse Rate:  [64-73] 68 (02/26 0925) Resp:  [7-20] 13 (02/26 0925) BP: (115-140)/(60-86) 140/70 (02/26 0925) SpO2:  [95 %-100 %] 95 % (02/26 0925) Weight:  [86.6 kg (191 lb)-87 kg (191 lb 12.8 oz)] 86.6 kg (191 lb) (02/26 0748)  Weight change: 2.1 kg (4 lb 10.1 oz) Filed Weights   05/08/16 0511 05/09/16 0509 05/09/16 0748  Weight: 85.2 kg (187 lb 14.4 oz) 87 kg (191 lb 12.8 oz) 86.6 kg (191 lb)    Intake/Output: I/O last 3 completed shifts: In: 840 [P.O.:840] Out: -    Intake/Output this shift:  No intake/output data recorded.  CVS- RRR RS- CTA ABD- BS present soft non-distended EXT- no edema   Basic Metabolic Panel:  Recent Labs Lab 05/05/16 1813 05/05/16 2351 05/07/16 0907 05/08/16 0300  NA 133* 133* 132* 133*  K 3.1* 4.0 5.7* 4.7  CL 93* 96* 94* 93*  CO2 28 24 19* 25  GLUCOSE 140* 92 80 122*  BUN 33* 35* 49* 36*  CREATININE 5.30* 5.93* 8.13* 7.31*  CALCIUM 9.1 9.7 9.4 9.5  MG  --  2.2  --   --   PHOS  --   --  7.5* 7.3*    Liver Function Tests:  Recent Labs Lab 05/05/16 1813 05/05/16 2351 05/07/16 0907 05/08/16 0300  AST 26 23  --   --   ALT 24 23  --   --   ALKPHOS 166* 159*  --   --   BILITOT 1.5* 1.7*  --   --   PROT 7.3 7.0  --   --   ALBUMIN 3.5 3.3* 3.4* 3.4*   No results for input(s): LIPASE, AMYLASE in the last 168 hours. No results for input(s): AMMONIA in the last 168 hours.  CBC:  Recent Labs Lab 05/05/16 1813  05/06/16 0635 05/06/16 1507 05/07/16 0907 05/08/16 0300 05/09/16 0203  WBC 7.3  --   --   --  7.2 6.5 7.2  HGB 8.2*  < > 8.1* 8.0* 8.3* 8.0* 7.7*  HCT 25.7*  < > 27.5* 27.4* 28.4* 27.6* 26.0*  MCV 89.3  --   --   --  94.4 93.6 93.5  PLT 279  --   --    --  306 296 273  < > = values in this interval not displayed.  Cardiac Enzymes: No results for input(s): CKTOTAL, CKMB, CKMBINDEX, TROPONINI in the last 168 hours.  BNP: Invalid input(s): POCBNP  CBG:  Recent Labs Lab 05/05/16 2125  GLUCAP 84    Microbiology: Results for orders placed or performed during the hospital encounter of 05/05/16  Body fluid culture     Status: None (Preliminary result)   Collection Time: 05/05/16  7:34 PM  Result Value Ref Range Status   Specimen Description ASCITIES  Final   Special Requests NONE  Final   Gram Stain   Final    RARE WBC PRESENT, PREDOMINANTLY MONONUCLEAR NO ORGANISMS SEEN    Culture   Final    NO GROWTH 3 DAYS Performed at [facility] [address] [place], [place] [address]    Report Status PENDING  Incomplete    Coagulation Studies: No results for  input(s): LABPROT, INR in the last 72 hours.  Urinalysis: No results for input(s): COLORURINE, LABSPEC, PHURINE, GLUCOSEU, HGBUR, BILIRUBINUR, KETONESUR, PROTEINUR, UROBILINOGEN, NITRITE, LEUKOCYTESUR in the last 72 hours.  Invalid input(s): APPERANCEUR    Imaging: No results found.   Medications:    . amLODipine  5 mg Oral Daily  . atorvastatin  40 mg Oral q1800  . calcium acetate  2,001 mg Oral TID WC  . cinacalcet  30 mg Oral Q supper  . darbepoetin (ARANESP) injection - DIALYSIS  60 mcg Intravenous Q Sat-HD  . doxercalciferol  7 mcg Intravenous Q T,Th,Sa-HD  . gabapentin  300 mg Oral QHS  . labetalol  100 mg Oral Daily  . mometasone-formoterol  2 puff Inhalation BID  . multivitamin  1 tablet Oral QHS  . pantoprazole (PROTONIX) IV  40 mg Intravenous Q12H  . sodium chloride flush  3 mL Intravenous Q12H  . sodium chloride flush  3 mL Intravenous Q12H   sodium chloride, acetaminophen **OR** acetaminophen, alum & mag hydroxide-simeth, menthol-cetylpyridinium, morphine injection, ondansetron **OR** ondansetron (ZOFRAN) IV, oxyCODONE-acetaminophen, sodium  chloride flush  Assessment/ Plan:   End stage renal disease   TTS   Admitted for GI bleed  Volume controlled AVF  Good thrill Plan dialysis no heparin in AM     LOS: 4 Stanley [name] @TODAY @11 :22 AM

## 2016-05-09 NOTE — Op Note (Addendum)
[facility] Patient Name: Stanley [name] Procedure Date : 05/09/2016 MRN: [id number] Attending MD: Jerene Bears , MD Date of Birth: 1960-05-18 CSN: [id number] Age: 56 Admit Type: Inpatient Procedure:                Upper GI endoscopy Indications:              Acute post hemorrhagic anemia, Melena, Abnormal                            video capsule endoscopy Providers:                Lajuan Lines. Hilarie Fredrickson, MD, Carolynn Comment, RN, Corliss Parish, Technician, Clearnce Sorrel, CRNA Referring MD:             [facility] Medicines:                Monitored Anesthesia Care Complications:            No immediate complications. Estimated Blood Loss:     Estimated blood loss: none. Procedure:                Pre-Anesthesia Assessment:                           - Prior to the procedure, a History and Physical                            was performed, and patient medications and                            allergies were reviewed. The patient's tolerance of                            previous anesthesia was also reviewed. The risks                            and benefits of the procedure and the sedation                            options and risks were discussed with the patient.                            All questions were answered, and informed consent                            was obtained. Prior Anticoagulants: The patient has                            taken no previous anticoagulant or antiplatelet                            agents. ASA Grade Assessment: III - A patient with  severe systemic disease. After reviewing the risks                            and benefits, the patient was deemed in                            satisfactory condition to undergo the procedure.                           After obtaining informed consent, the endoscope was                            passed under direct vision. Throughout the                    procedure, the patient's blood pressure, pulse, and                            oxygen saturations were monitored                            continuously.After obtaining informed consent, the                            endoscope was passed under direct vision.                            Throughout the procedure, the patient's blood                            pressure, pulse, and oxygen saturations were                            monitored continuously. The [id number]-[id number] ([id number])                            scope was introduced through the mouth, and                            advanced to the duodenal bulb. The upper GI                            endoscopy was accomplished without difficulty. The                            patient tolerated the procedure well. Scope In: Scope Out: Findings:      The examined esophagus was normal. No evidence for esophageal varices      A large amount of food (residue) was found in the gastric fundus and in       the gastric body interfering with complete visualization of the stomach.       Due to planned deep enteroscopy in the presence of food in the stomach       the procedure was aborted due to risk of aspiration.      Localized mild inflammation characterized by erythema was found at the  pylorus. This is most likely from trauma from video capsule as this area       was recently examined at endoscopy and found to be normal.      The duodenal bulb, limited views, was normal. Impression:               - Normal esophagus.                           - A large amount of food (residue) in the stomach                            interfering with visualization of the stomach.                            Procedure aborted                           - Mild inflammation at pylorus                           - Normal duodenal bulb (limited views).                           - No specimens collected. Moderate Sedation:      N/A Recommendation:            - Return patient to hospital ward for ongoing care.                           - Clear liquid diet today.                           - Reglan 10 mg every 6 hours until proecdure                            tomorrow.                           - Continue present medications.                           - Repeat attempt at small bowel enteroscopy                            tomorrow. Procedure Code(s):        --- Professional ---                           [phone], Esophagogastroduodenoscopy, flexible,                            transoral; diagnostic, including collection of                            specimen(s) by brushing or washing, when performed                            (separate procedure)  Diagnosis Code(s):        --- Professional ---                           K29.70, Gastritis, unspecified, without bleeding                           D62, Acute posthemorrhagic anemia                           K92.1, Melena (includes Hematochezia)                           R93.3, Abnormal findings on diagnostic imaging of                            other parts of digestive tract CPT copyright 2016 [employer]. All rights reserved. The codes documented in this report are preliminary and upon coder review may  be revised to meet current compliance requirements. Jerene Bears, MD 05/09/2016 9:24:00 AM This report has been signed electronically. Number of Addenda: 0

## 2016-05-09 NOTE — Interval H&P Note (Signed)
History and Physical Interval Note: Pt for SBE today + VCE with active bleeding in proximal small bowel No interval change overnight Hgb relatively stable The nature of the procedure, as well as the risks, benefits, and alternatives were carefully and thoroughly reviewed with the patient. Ample time for discussion and questions allowed. The patient understood, was satisfied, and agreed to proceed.    CBC Latest Ref Rng & Units 05/09/2016 05/08/2016 05/07/2016  WBC 4.0 - 10.5 K/uL 7.2 6.5 7.2  Hemoglobin 13.0 - 17.0 g/dL 7.7(L) 8.0(L) 8.3(L)  Hematocrit 39.0 - 52.0 % 26.0(L) 27.6(L) 28.4(L)  Platelets 150 - 400 K/uL 273 296 306     05/09/2016 8:43 AM  Stanley [name]  has presented today for surgery, with the diagnosis of Bleeding in small bowel seen on WCE  The various methods of treatment have been discussed with the patient and family. After consideration of risks, benefits and other options for treatment, the patient has consented to  Procedure(s): ENTEROSCOPY (N/A) as a surgical intervention .  The patient's history has been reviewed, patient examined, no change in status, stable for surgery.  I have reviewed the patient's chart and labs.  Questions were answered to the patient's satisfaction.     Linsey Hirota M

## 2016-05-09 NOTE — Progress Notes (Signed)
PROGRESS NOTE        PATIENT DETAILS Name: Stanley [name] Age: 56 y.o. Sex: male Date of Birth: March 10, 1961 Admit Date: 05/05/2016 Admitting Physician Vianne Bulls, MD NAT:[id number] Ladoris Gene, MD  Brief Narrative: Patient is a 56 y.o. male with history of ESRD on hemodialysis, recurrent melena intermittently over the past several months with negative endoscopic workup (except for AVM's) at [facility]-history admitted for melena, GI consulted-workup in progress. See details below.  Subjective: Lying comfortably in bed-claims no further black stools since yesterday afternoon.  Assessment/Plan: Melena: Likely due to small bowel bleeding-recent EGD on 2/4 was negative. Has known history of AVMs per chart review. Capsule endoscopy completed on 2/24-which showed a proximal small bowel bleed. EGD/enteroscopy attempted on 2/26-however found to have large amount of food (residue) found in stomach, obstructing visualization of stomach. GI planning on repeat EGD with enteroscopy on 2/27. Avoid NSAIDs/aspirin given recurrent GI bleeding.  Gastroparesis: EGD on 2/26- Patient was kept NPO after midnight, large amount of food (residue) was found in the stomach. He was started on IV reglan.   Acute/subacute on chronic blood loss anemia: due to above. Hb stable- Continue to follow hemoglobin and transfuse accordingly.Suspect may require 1 unit of PRBC with HD tomorrow.  ESRD: Nephrology following-HD TTS.  Recurrent ascites: Likely multifactorial from known liver cirrhosis and ESRD. Limited abdominal ultrasound on 2/23 showed very small volume of ascites-with insufficient amount for paracentesis.  History of alcoholic liver cirrhosis/remote alcohol use: Compensated-no signs of withdrawal  Hypertension: Controlled, continue labetalol, amlodipine.  GERD: Continue PPI.  Hx of asthma: Stable-clear lungs-continue Symbicort and as needed albuterol.  DVT Prophylaxis: SCD  Code  Status: Full code  Family Communication: None at bedside  Disposition Plan: Remain inpatient-requires several days of hospitalization prior to discharge.  Antimicrobial agents: Anti-infectives    None     Procedures: 2/24>>Capsule endoscopy 2/26>>EGD  CONSULTS:  GI and nephrology  Time spent: 25- minutes-Greater than 50% of this time was spent in counseling, explanation of diagnosis, planning of further management, and coordination of care.  MEDICATIONS: Scheduled Meds: . amLODipine  5 mg Oral Daily  . atorvastatin  40 mg Oral q1800  . calcium acetate  2,001 mg Oral TID WC  . cinacalcet  30 mg Oral Q supper  . darbepoetin (ARANESP) injection - DIALYSIS  60 mcg Intravenous Q Sat-HD  . doxercalciferol  7 mcg Intravenous Q T,Th,Sa-HD  . gabapentin  300 mg Oral QHS  . labetalol  100 mg Oral Daily  . metoCLOPramide (REGLAN) injection  10 mg Intravenous Q6H  . mometasone-formoterol  2 puff Inhalation BID  . multivitamin  1 tablet Oral QHS  . pantoprazole (PROTONIX) IV  40 mg Intravenous Q12H  . sodium chloride flush  3 mL Intravenous Q12H  . sodium chloride flush  3 mL Intravenous Q12H   Continuous Infusions: PRN Meds:.sodium chloride, acetaminophen **OR** acetaminophen, alum & mag hydroxide-simeth, menthol-cetylpyridinium, morphine injection, ondansetron **OR** ondansetron (ZOFRAN) IV, oxyCODONE-acetaminophen, sodium chloride flush   PHYSICAL EXAM: Vital signs: Vitals:   05/09/16 0748 05/09/16 0905 05/09/16 0915 05/09/16 0925  BP: 137/78 115/62 118/60 140/70  Pulse: 68 65 64 68  Resp: 12 12 (!) 7 13  Temp: 97.8 F (36.6 C) 97.6 F (36.4 C)    TempSrc: Oral Oral    SpO2: 97% 98% 96% 95%  Weight: 86.6 kg (191 lb)  Height: 6\' 3"  (1.905 m)      Filed Weights   05/08/16 0511 05/09/16 0509 05/09/16 0748  Weight: 85.2 kg (187 lb 14.4 oz) 87 kg (191 lb 12.8 oz) 86.6 kg (191 lb)   Body mass index is 23.87 kg/m.   General appearance :Awake, alert, not in any  distress. Speech Clear. Eyes:, pupils equally reactive to light and accomodation,no scleral icterus. HEENT: Atraumatic and Normocephalic Neck: supple, no JVD. No cervical lymphadenopathy.  Resp:Good air entry bilaterally, no added sounds  CVS: S1 S2 regular, no murmurs.  GI: Bowel sounds present, Non tender and not distended with no gaurding, rigidity or rebound.No organomegaly Extremities: B/L Lower Ext shows no edema, both legs are warm to touch Neurology:  speech clear,Non focal, sensation is grossly intact. Psychiatric: Normal judgment and insight. Alert and oriented x 3. Normal mood. Musculoskeletal:No digital cyanosis Skin:No Rash, warm and dry Wounds:N/A  I have personally reviewed following labs and imaging studies  LABORATORY DATA: CBC:  Recent Labs Lab 05/05/16 1813  05/06/16 0635 05/06/16 1507 05/07/16 0907 05/08/16 0300 05/09/16 0203  WBC 7.3  --   --   --  7.2 6.5 7.2  HGB 8.2*  < > 8.1* 8.0* 8.3* 8.0* 7.7*  HCT 25.7*  < > 27.5* 27.4* 28.4* 27.6* 26.0*  MCV 89.3  --   --   --  94.4 93.6 93.5  PLT 279  --   --   --  306 296 273  < > = values in this interval not displayed.  Basic Metabolic Panel:  Recent Labs Lab 05/05/16 1813 05/05/16 2351 05/07/16 0907 05/08/16 0300  NA 133* 133* 132* 133*  K 3.1* 4.0 5.7* 4.7  CL 93* 96* 94* 93*  CO2 28 24 19* 25  GLUCOSE 140* 92 80 122*  BUN 33* 35* 49* 36*  CREATININE 5.30* 5.93* 8.13* 7.31*  CALCIUM 9.1 9.7 9.4 9.5  MG  --  2.2  --   --   PHOS  --   --  7.5* 7.3*    GFR: Estimated Creatinine Clearance: 13.3 mL/min (by C-G formula based on SCr of 7.31 mg/dL (H)).  Liver Function Tests:  Recent Labs Lab 05/05/16 1813 05/05/16 2351 05/07/16 0907 05/08/16 0300  AST 26 23  --   --   ALT 24 23  --   --   ALKPHOS 166* 159*  --   --   BILITOT 1.5* 1.7*  --   --   PROT 7.3 7.0  --   --   ALBUMIN 3.5 3.3* 3.4* 3.4*   No results for input(s): LIPASE, AMYLASE in the last 168 hours. No results for  input(s): AMMONIA in the last 168 hours.  Coagulation Profile:  Recent Labs Lab 05/05/16 1932 05/05/16 2351  INR 1.13 1.22    Cardiac Enzymes: No results for input(s): CKTOTAL, CKMB, CKMBINDEX, TROPONINI in the last 168 hours.  BNP (last 3 results) No results for input(s): PROBNP in the last 8760 hours.  HbA1C: No results for input(s): HGBA1C in the last 72 hours.  CBG:  Recent Labs Lab 05/05/16 2125  GLUCAP 84    Lipid Profile: No results for input(s): CHOL, HDL, LDLCALC, TRIG, CHOLHDL, LDLDIRECT in the last 72 hours.  Thyroid Function Tests: No results for input(s): TSH, T4TOTAL, FREET4, T3FREE, THYROIDAB in the last 72 hours.  Anemia Panel: No results for input(s): VITAMINB12, FOLATE, FERRITIN, TIBC, IRON, RETICCTPCT in the last 72 hours.  Urine analysis:    Component Value Date/Time  COLORURINE YELLOW (A) 05/03/2015 0815   APPEARANCEUR CLEAR (A) 05/03/2015 0815   APPEARANCEUR Clear 11/13/2013 1314   LABSPEC 1.008 05/03/2015 0815   LABSPEC 1.014 11/13/2013 1314   PHURINE 9.0 (H) 05/03/2015 0815   GLUCOSEU NEGATIVE 05/03/2015 0815   GLUCOSEU 50 mg/dL 11/13/2013 1314   HGBUR NEGATIVE 05/03/2015 0815   BILIRUBINUR NEGATIVE 05/03/2015 0815   BILIRUBINUR Negative 11/13/2013 1314   KETONESUR NEGATIVE 05/03/2015 0815   PROTEINUR 100 (A) 05/03/2015 0815   UROBILINOGEN 0.2 06/22/2014 1230   NITRITE NEGATIVE 05/03/2015 0815   LEUKOCYTESUR NEGATIVE 05/03/2015 0815   LEUKOCYTESUR Negative 11/13/2013 1314    Sepsis Labs: Lactic Acid, Venous    Component Value Date/Time   LATICACIDVEN 1.3 04/06/2015 0134    MICROBIOLOGY: Recent Results (from the past 240 hour(s))  Body fluid culture     Status: None (Preliminary result)   Collection Time: 05/05/16  7:34 PM  Result Value Ref Range Status   Specimen Description ASCITIES  Final   Special Requests NONE  Final   Gram Stain   Final    RARE WBC PRESENT, PREDOMINANTLY MONONUCLEAR NO ORGANISMS SEEN    Culture    Final    NO GROWTH 2 DAYS Performed at [facility], [place] [address] [place], [place] [address]    Report Status PENDING  Incomplete    RADIOLOGY STUDIES/RESULTS: Dg Ribs Unilateral W/chest Right  Result Date: 04/21/2016 CLINICAL DATA:  Syncope during dialysis, slipped in shower this morning and fell striking RIGHT lateral ribs, history hypertension, coronary disease, cirrhosis, end- stage renal disease alcohol and drug abuse EXAM: RIGHT RIBS AND CHEST - 3+ VIEW COMPARISON:  Chest radiograph 03/20/2016 FINDINGS: Enlargement of cardiac silhouette post CABG with pulmonary vascular congestion Atherosclerotic calcification aorta. Minimal chronic accentuation of interstitial markings, stable. No acute failure or consolidation. No pleural effusion or pneumothorax. Osseous mineralization grossly normal. BB placed at site of symptoms lower lateral RIGHT chest. Nondisplaced fractures of the lateral RIGHT ninth tenth ribs identified. IMPRESSION: Enlargement of cardiac silhouette with pulmonary vascular congestion post CABG. No acute RIGHT infiltrate or pneumothorax. Nondisplaced fractures lateral RIGHT ninth and tenth ribs. Electronically Signed   By: Lavonia Dana M.D.   On: 04/21/2016 15:56   US Abdomen Limited  Result Date: 05/06/2016 CLINICAL DATA:  56 year old male with cirrhosis and end-stage renal disease. Abdominal pain. EXAM: LIMITED ABDOMEN ULTRASOUND FOR ASCITES TECHNIQUE: Limited ultrasound survey for ascites was performed in all four abdominal quadrants. COMPARISON:  03/09/2016. FINDINGS: Hepatomegaly and small volume of ascites fluid is identified in the 4 quadrants of the abdomen. The quantity is less than that on 03/09/2016 and insufficient for paracentesis. IMPRESSION: Small volume of ascites, quantity insufficient for paracentesis. Electronically Signed   By: Genevie Ann M.D.   On: 05/06/2016 11:16   US Venous Img Lower Unilateral Left  Result Date: 04/16/2016 CLINICAL DATA:  Left leg pain  and swelling for 1 day. Concern for DVT. EXAM: LEFT LOWER EXTREMITY VENOUS DOPPLER ULTRASOUND TECHNIQUE: Gray-scale sonography with graded compression, as well as color Doppler and duplex ultrasound were performed to evaluate the lower extremity deep venous systems from the level of the common femoral vein and including the common femoral, femoral, profunda femoral, popliteal and calf veins including the posterior tibial, peroneal and gastrocnemius veins when visible. The superficial great saphenous vein was also interrogated. Spectral Doppler was utilized to evaluate flow at rest and with distal augmentation maneuvers in the common femoral, femoral and popliteal veins. COMPARISON:  Lower extremity duplex  06/18/2011 FINDINGS: Contralateral Common Femoral Vein: Respiratory phasicity is normal and symmetric with the symptomatic side. No evidence of thrombus. Normal compressibility. Common Femoral Vein: No evidence of thrombus. Normal compressibility, respiratory phasicity and response to augmentation. Saphenofemoral Junction: No evidence of thrombus. Normal compressibility and flow on color Doppler imaging. Profunda Femoral Vein: No evidence of thrombus. Normal compressibility and flow on color Doppler imaging. Femoral Vein: No evidence of thrombus. Normal compressibility, respiratory phasicity and response to augmentation. Popliteal Vein: No evidence of thrombus. Normal compressibility, respiratory phasicity and response to augmentation. Calf Veins: No evidence of thrombus. Normal compressibility and flow on color Doppler imaging. Superficial Great Saphenous Vein: No evidence of thrombus. Normal compressibility and flow on color Doppler imaging. Venous Reflux:  None. Other Findings: Subcutaneous edema noted about the popliteal area and calf. IMPRESSION: No evidence of left lower extremity deep venous thrombosis. Soft tissue edema of the calf. Electronically Signed   By: Jeb Levering M.D.   On: 04/16/2016 01:16      LOS: 4 days   Kyra Leyland, PA-S  If 7PM-7AM, please contact night-coverage [email or web address] Password [facility] 05/09/2016, 9:34 AM  Attending MD note  Patient was seen, examined,treatment plan was discussed with the PA-S.  I have personally reviewed the clinical findings, lab, imaging studies and management of this patient in detail. I agree with the documentation, as recorded by the PA-S.   Claims no further melena since yesterday afternoon. Lying comfortably in bed   On Exam: Gen. exam: Awake, alert, not in any distress Chest: Good air entry bilaterally, no rhonchi or rales CVS: S1-S2 regular, no murmurs Abdomen: Soft, nontender and nondistended Neurology: Non-focal Skin: No rash or lesions  Impression: Small bowel bleeding with acute blood loss anemia ESRD Alcoholic liver cirrhosis  Plan: GI plans to repeat EGD with enteroscopy on 2/27 Continue to follow CBC and transfuse accordingly.  Rest as above  [facility]

## 2016-05-09 NOTE — H&P (View-Only) (Signed)
[facility] Progress Note  Subjective:  WCE showed bleeding in his proximal small bowel.  No clinical signs of overt GI bleeding.  Hgb stable at 8.0 grams this AM.  Just finished eating lunch.  Objective:  Vital signs in last 24 hours: Temp:  [97.5 F (36.4 C)-98.7 F (37.1 C)] 97.5 F (36.4 C) (02/25 0511) Pulse Rate:  [60-71] 60 (02/25 0511) Resp:  [16-18] 18 (02/25 0511) BP: (113-132)/(61-80) 132/75 (02/25 0511) SpO2:  [96 %-99 %] 99 % (02/25 0511) Weight:  [187 lb 14.4 oz (85.2 kg)] 187 lb 14.4 oz (85.2 kg) (02/25 0511) Last BM Date: 05/07/16 General:  Alert, Well-developed, in NAD Heart:  Regular rate and rhythm; no murmurs Pulm:  CTAB.  No increased WOB. Abdomen:  Soft, distended from ascites fluid.  Non-tender.   Extremities:  Without edema. Neurologic:  Alert and oriented x 4;  grossly normal neurologically. Psych:  Alert and cooperative. Normal mood and affect.  Intake/Output from previous day: 02/24 0701 - 02/25 0700 In: 840 [P.O.:840] Out: 739   Lab Results:  Recent Labs  05/05/16 1813  05/06/16 1507 05/07/16 0907 05/08/16 0300  WBC 7.3  --   --  7.2 6.5  HGB 8.2*  < > 8.0* 8.3* 8.0*  HCT 25.7*  < > 27.4* 28.4* 27.6*  PLT 279  --   --  306 296  < > = values in this interval not displayed. BMET  Recent Labs  05/05/16 2351 05/07/16 0907 05/08/16 0300  NA 133* 132* 133*  K 4.0 5.7* 4.7  CL 96* 94* 93*  CO2 24 19* 25  GLUCOSE 92 80 122*  BUN 35* 49* 36*  CREATININE 5.93* 8.13* 7.31*  CALCIUM 9.7 9.4 9.5   LFT  Recent Labs  05/05/16 2351  05/08/16 0300  PROT 7.0  --   --   ALBUMIN 3.3*  < > 3.4*  AST 23  --   --   ALT 23  --   --   ALKPHOS 159*  --   --   BILITOT 1.7*  --   --   < > = values in this interval not displayed. PT/INR  Recent Labs  05/05/16 1932 05/05/16 2351  LABPROT 14.6 15.5*  INR 1.13 1.22   Hepatitis Panel  Recent Labs  05/07/16 0926  HEPBSAG Negative   Assessment /  Plan: Melena  Anemia of acute on chronic GI blood loss. There is recurrent obscure small bowel GI blood loss on top of anemia of chronic kidney disease.  Hgb stable this AM.  Alcohol related cirrhosis, now abstinent from alcohol by his report. He has ascites which is from both portal hypertension and also chronic kidney disease. He does not have over 500 WBCs or 250 PMNs on, so he does not have SBP.  Generalized abdominal pain from ascites  *WCE showed bleeding source in proximal small bowel.  Needs enteroscopy 2/26.  Clear liquids today then NPO after midnight.  Will give one bottle of Moviprep this evening since prep for WCE was poor.  Also will give Reglan 5 mg IV x 3 to help clear his stomach as well.   LOS: 3 days   ZEHR, JESSICA D.  05/08/2016, 12:15 PM  Pager number [phone]   I have discussed the case with the PA, and that is the plan I formulated. I personally interviewed and examined the patient.  SB enteroscopy tomorrow to hopefully find and treat this bleeding source.    Mallie Mussel  L Danis III Pager [phone]  Mon-Fri 8a-5p [phone] after 5p, weekends, holidays

## 2016-05-09 NOTE — Transfer of Care (Signed)
Immediate Anesthesia Transfer of Care Note  Patient: Stanley [name]  Procedure(s) Performed: Procedure(s): ESOPHAGOGASTRODUODENOSCOPY (EGD) WITH PROPOFOL  Patient Location: Endoscopy Unit  Anesthesia Type:MAC  Level of Consciousness: awake  Airway & Oxygen Therapy: Patient Spontanous Breathing and Patient connected to nasal cannula oxygen  Post-op Assessment: Report given to RN and Post -op Vital signs reviewed and stable  Post vital signs: Reviewed  Last Vitals:  Vitals:   05/09/16 0748 05/09/16 0905  BP: 137/78 115/62  Pulse: 68 65  Resp: 12 12  Temp: 36.6 C 36.4 C    Last Pain:  Vitals:   05/09/16 0905  TempSrc: Oral  PainSc:       Patients Stated Pain Goal: 2 (89/37/34 2876)  Complications: No apparent anesthesia complications

## 2016-05-10 ENCOUNTER — Encounter (HOSPITAL_COMMUNITY): Payer: Self-pay | Admitting: Certified Registered"

## 2016-05-10 ENCOUNTER — Encounter (HOSPITAL_COMMUNITY)
Admission: AD | Disposition: A | Discharge status: Home / Self Care | Payer: Self-pay | Source: Other Acute Inpatient Hospital | Attending: Internal Medicine

## 2016-05-10 ENCOUNTER — Inpatient Hospital Stay (HOSPITAL_COMMUNITY): Payer: Medicare Other | Admitting: Certified Registered"

## 2016-05-10 DIAGNOSIS — K5520 Angiodysplasia of colon without hemorrhage: Secondary | ICD-10-CM

## 2016-05-10 DIAGNOSIS — I10 Essential (primary) hypertension: Secondary | ICD-10-CM

## 2016-05-10 HISTORY — PX: ENTEROSCOPY: SHX5533

## 2016-05-10 LAB — RENAL FUNCTION PANEL
ALBUMIN: 3.5 g/dL (ref 3.5–5.0)
Anion gap: 16 — ABNORMAL HIGH (ref 5–15)
BUN: 54 mg/dL — AB (ref 6–20)
CALCIUM: 9.7 mg/dL (ref 8.9–10.3)
CO2: 21 mmol/L — ABNORMAL LOW (ref 22–32)
Chloride: 93 mmol/L — ABNORMAL LOW (ref 101–111)
Creatinine, Ser: 10.53 mg/dL — ABNORMAL HIGH (ref 0.61–1.24)
GFR calc Af Amer: 5 mL/min — ABNORMAL LOW (ref >60)
GFR, EST NON AFRICAN AMERICAN: 5 mL/min — AB (ref >60)
Glucose, Bld: 89 mg/dL (ref 65–99)
Phosphorus: 7.7 mg/dL — ABNORMAL HIGH (ref 2.5–4.6)
Potassium: 6 mmol/L — ABNORMAL HIGH (ref 3.5–5.1)
SODIUM: 130 mmol/L — AB (ref 135–145)

## 2016-05-10 LAB — CBC
HCT: 27.1 % — ABNORMAL LOW (ref 39.0–52.0)
Hemoglobin: 8 g/dL — ABNORMAL LOW (ref 13.0–17.0)
MCH: 27 pg (ref 26.0–34.0)
MCHC: 29.5 g/dL — ABNORMAL LOW (ref 30.0–36.0)
MCV: 91.6 fL (ref 78.0–100.0)
PLATELETS: 302 10*3/uL (ref 150–400)
RBC: 2.96 MIL/uL — AB (ref 4.22–5.81)
RDW: 17.7 % — AB (ref 11.5–15.5)
WBC: 6.7 10*3/uL (ref 4.0–10.5)

## 2016-05-10 LAB — BODY FLUID CULTURE: CULTURE: NO GROWTH

## 2016-05-10 LAB — POCT I-STAT 4, (NA,K, GLUC, HGB,HCT)
Glucose, Bld: 83 mg/dL (ref 65–99)
HCT: 28 % — ABNORMAL LOW (ref 39.0–52.0)
Hemoglobin: 9.5 g/dL — ABNORMAL LOW (ref 13.0–17.0)
POTASSIUM: 3.7 mmol/L (ref 3.5–5.1)
Sodium: 136 mmol/L (ref 135–145)

## 2016-05-10 SURGERY — ENTEROSCOPY
Anesthesia: Monitor Anesthesia Care

## 2016-05-10 MED ORDER — FLUCONAZOLE 100 MG PO TABS: 100.0000 mg | ORAL_TABLET | Freq: Every day | ORAL | 0 refills | Status: DC

## 2016-05-10 MED ORDER — PROPOFOL 500 MG/50ML IV EMUL
INTRAVENOUS | Status: DC | PRN
  Administered 2016-05-10: 75 ug/kg/min via INTRAVENOUS

## 2016-05-10 MED ORDER — FLUCONAZOLE 200 MG PO TABS
200.0000 mg | ORAL_TABLET | Freq: Once | ORAL | Status: AC
  Administered 2016-05-10: 200 mg via ORAL
  Filled 2016-05-10: qty 2
  Filled 2016-05-10: qty 1

## 2016-05-10 MED ORDER — FLUCONAZOLE 100 MG PO TABS: 100.0000 mg | ORAL_TABLET | Freq: Every day | ORAL | Status: DC

## 2016-05-10 MED ORDER — SODIUM CHLORIDE 0.9 % IV SOLN: 100.0000 mL | INTRAVENOUS | Status: DC | PRN

## 2016-05-10 MED ORDER — FLUCONAZOLE 200 MG PO TABS: 200.0000 mg | ORAL_TABLET | Freq: Once | ORAL | Status: DC

## 2016-05-10 MED ORDER — LIDOCAINE HCL (PF) 1 % IJ SOLN: 5.0000 mL | INTRAMUSCULAR | Status: DC | PRN

## 2016-05-10 MED ORDER — OXYCODONE-ACETAMINOPHEN 5-325 MG PO TABS
ORAL_TABLET | ORAL | Status: AC
  Administered 2016-05-10: 1 via ORAL
  Filled 2016-05-10: qty 2

## 2016-05-10 MED ORDER — PROPOFOL 10 MG/ML IV BOLUS
INTRAVENOUS | Status: DC | PRN
  Administered 2016-05-10 (×2): 10 mg via INTRAVENOUS
  Administered 2016-05-10: 20 mg via INTRAVENOUS

## 2016-05-10 MED ORDER — LIDOCAINE-PRILOCAINE 2.5-2.5 % EX CREA
1.0000 | TOPICAL_CREAM | CUTANEOUS | Status: DC | PRN
Start: 2016-05-10 — End: 2016-05-10
  Filled 2016-05-10: qty 5

## 2016-05-10 MED ORDER — ALTEPLASE 2 MG IJ SOLR: 2.0000 mg | Freq: Once | INTRAMUSCULAR | Status: DC | PRN

## 2016-05-10 MED ORDER — HEPARIN SODIUM (PORCINE) 1000 UNIT/ML DIALYSIS: 1000.0000 [IU] | INTRAMUSCULAR | Status: DC | PRN

## 2016-05-10 MED ORDER — PANTOPRAZOLE SODIUM 40 MG PO TBEC: 40.0000 mg | DELAYED_RELEASE_TABLET | Freq: Every day | ORAL | Status: DC

## 2016-05-10 MED ORDER — PANTOPRAZOLE SODIUM 40 MG PO TBEC: 40.0000 mg | DELAYED_RELEASE_TABLET | Freq: Every day | ORAL | 0 refills | Status: DC

## 2016-05-10 MED ORDER — DOXERCALCIFEROL 4 MCG/2ML IV SOLN
INTRAVENOUS | Status: AC
  Administered 2016-05-10: 7 ug via INTRAVENOUS
  Filled 2016-05-10: qty 4

## 2016-05-10 MED ORDER — PENTAFLUOROPROP-TETRAFLUOROETH EX AERO: 1.0000 "application " | INHALATION_SPRAY | CUTANEOUS | Status: DC | PRN

## 2016-05-10 NOTE — Transfer of Care (Signed)
Immediate Anesthesia Transfer of Care Note  Patient: Stanley [name]  Procedure(s) Performed: Procedure(s): ENTEROSCOPY (N/A)  Patient Location: Endoscopy Unit  Anesthesia Type:MAC  Level of Consciousness: awake, oriented and patient cooperative  Airway & Oxygen Therapy: Patient Spontanous Breathing and Patient connected to nasal cannula oxygen  Post-op Assessment: Report given to RN, Post -op Vital signs reviewed and stable and Patient moving all extremities  Post vital signs: Reviewed and stable  Last Vitals:  Vitals:   05/10/16 1300 05/10/16 1351  BP: 122/60 120/68  Pulse: 80 81  Resp: 14 10  Temp: 36.8 C 36.8 C    Last Pain:  Vitals:   05/10/16 1351  TempSrc: Oral  PainSc:       Patients Stated Pain Goal: 2 (71/24/58 0998)  Complications: No apparent anesthesia complications

## 2016-05-10 NOTE — Interval H&P Note (Signed)
History and Physical Interval Note: For repeat SBE today to eval + VCE with bleeding in proximal small bowel K high this am, but now has completed HD session [place].The nature of the procedure, as well as the risks, benefits, and alternatives were carefully and thoroughly reviewed with the patient. Ample time for discussion and questions allowed. The patient understood, was satisfied, and agreed to proceed.     05/10/2016 2:01 PM  Stanley [name]  has presented today for surgery, with the diagnosis of melena  The various methods of treatment have been discussed with the patient and family. After consideration of risks, benefits and other options for treatment, the patient has consented to  Procedure(s): ENTEROSCOPY (N/A) as a surgical intervention .  The patient's history has been reviewed, patient examined, no change in status, stable for surgery.  I have reviewed the patient's chart and labs.  Questions were answered to the patient's satisfaction.     PYRTLE, JAY M

## 2016-05-10 NOTE — Care Management Note (Signed)
Case Management Note Marvetta Gibbons RN, BSN Unit 2W-Case Manager [phone]  Patient Details  Name: Stanley [name] MRN: [id number] Date of Birth: [birth year]  Subjective/Objective:    Pt admitted with GIB                Action/Plan:  PTA pt lived at home, plan to return home- no CM needs noted for discharge  Expected Discharge Date:  05/10/16               Expected Discharge Plan:  Home/Self Care  In-House Referral:     Discharge planning Services  CM Consult  Post Acute Care Choice:  NA Choice offered to:  NA  DME Arranged:    DME Agency:     HH Arranged:    [place] Agency:     Status of Service:  Completed, signed off  If discussed at [employer] of Stay Meetings, dates discussed:    Additional Comments:  Dawayne Patricia, RN 05/10/2016, 3:43 PM

## 2016-05-10 NOTE — Discharge Summary (Signed)
PATIENT DETAILS Name: Stanley [name] Age: 56 y.o. Sex: male Date of Birth: Oct 01, 1960 MRN: [id number]. Admitting Physician: Vianne Bulls, MD [id number] Ladoris Gene, MD  Admit Date: 05/05/2016 Discharge date: 05/10/2016  Recommendations for Outpatient Follow-up:  1. Follow up with PCP in 1-2 weeks 2. Please obtain BMP/CBC in one week 3. Avoid NSAID's, ASA   Admitted From:  Home  Disposition: [place]: No  Equipment/Devices: None  Discharge Condition: Stable  CODE STATUS: FULL CODE  Diet recommendation:  Heart Healthy  Brief Summary: See H&P, Labs, Consult and Test reports for all details in brief, Patient is a 56 y.o. male with history of ESRD on hemodialysis, recurrent melena intermittently over the past several months with negative endoscopic workup (except for AVM's) at [facility]-history admitted for melena, GI consulted-underwent capsule endoscopy that showed a proximal small bowel bleed. See details below.  Brief Hospital Course: Melena: Likely due to small bowel bleeding-recent EGD on 2/4 was negative. Has known history of AVMs per chart review. Capsule endoscopy completed on 2/24-which showed a proximal small bowel bleed. EGD/enteroscopy attempted on 2/26-however found to have large amount of food (residue) in stomach, obstructing visualization of stomach. GI  repeated EGD with enteroscopy on 2/27 which showed A single non-bleeding angiodysplastic lesion in the jejunum, which was treated with argon plasma coagulation. Avoid NSAIDs/aspirin given recurrent GI bleeding.Recommendations are to d/c home with PPI.   Esophageal Candidiasis: seen during EGD-recommendations are to treat with Fluconazole.   Hyperkalemia: Potassium of 6.0 on 2/27-treated with dialysis-K subsequently normalized  Acute/subacute on chronic blood loss anemia: due to above. Hb stable  ESRD: Nephrology followed-HD TTS.Dr Justin Mend aware of d/c plans today  Recurrent ascites: Likely  multifactorial from known liver cirrhosis and ESRD. Limited abdominal ultrasound on 2/23 showed very small volume of ascites-with insufficient amount for paracentesis.  History of alcoholic liver cirrhosis/remote alcohol use: Compensated-no signs of withdrawal  Hypertension: Controlled, continue labetalol, amlodipine.  GERD: Continue PPI.  Hx of asthma: Stable-clear lungs-continue Symbicort and as needed albuterol.  Procedures/Studies: 2/24>>Capsule endoscopy 2/26>>attempted Small bowel enteroscopy 2/27>>Small bowel enteroscopy  Discharge Diagnoses:  Principal Problem:   GIB (gastrointestinal bleeding) Active Problems:   ESRD on dialysis ([place])   HTN (hypertension)   GERD (gastroesophageal reflux disease)   Cirrhosis of liver with ascites (HCC)   GI bleed   Gastritis   Abdominal pain   Anemia due to chronic blood loss   Melena   Small bowel bleed not requiring more than 4 units of blood in 24 hours, ICU, or surgery   Angiodysplasia of small intestine   Discharge Instructions:  Activity:  As tolerated with Full fall precautions use walker/cane & assistance as needed   Discharge Instructions    Call MD for:    Complete by:  As directed    BLACK TARRY STOOLS   Diet - low sodium heart healthy    Complete by:  As directed    Diet Carb Modified    Complete by:  As directed    Discharge instructions    Complete by:  As directed    Follow with Primary MD  Elyse Jarvis, MD  and your primary GI MD -Dr Allen Norris in 1-2 weeks  AVOID ASPIRIN AND NON STEROIDAL ANTI-INFLAMMATORY MEDICATIONS (MOTRIN, NAPROXEN, ADDVIL, ALEVE ETC)  Please get a complete blood count and chemistry panel checked by your Primary MD at your next visit, and again as instructed by your Primary MD.  Get Medicines reviewed and adjusted: Please take  all your medications with you for your next visit with your Primary MD  Laboratory/radiological data: Please request your Primary MD to go over all  hospital tests and procedure/radiological results at the follow up, please ask your Primary MD to get all Hospital records sent to his/her office.  In some cases, they will be blood work, cultures and biopsy results pending at the time of your discharge. Please request that your primary care M.D. follows up on these results.  Also Note the following: If you experience worsening of your admission symptoms, develop shortness of breath, life threatening emergency, suicidal or homicidal thoughts you must seek medical attention immediately by calling 911 or calling your MD immediately  if symptoms less severe.  You must read complete instructions/literature along with all the possible adverse reactions/side effects for all the Medicines you take and that have been prescribed to you. Take any new Medicines after you have completely understood and accpet all the possible adverse reactions/side effects.   Do not drive when taking Pain medications or sleeping medications (Benzodaizepines)  Do not take more than prescribed Pain, Sleep and Anxiety Medications. It is not advisable to combine anxiety,sleep and pain medications without talking with your primary care practitioner  Special Instructions: If you have smoked or chewed Tobacco  in the last 2 yrs please stop smoking, stop any regular Alcohol  and or any Recreational drug use.  Wear Seat belts while driving.  Please note: You were cared for by a hospitalist during your hospital stay. Once you are discharged, your primary care physician will handle any further medical issues. Please note that NO REFILLS for any discharge medications will be authorized once you are discharged, as it is imperative that you return to your primary care physician (or establish a relationship with a primary care physician if you do not have one) for your post hospital discharge needs so that they can reassess your need for medications and monitor your lab values.   Increase  activity slowly    Complete by:  As directed      Allergies as of 05/10/2016   No Known Allergies     Medication List    STOP taking these medications   furosemide 80 MG tablet Commonly known as:  LASIX   naproxen 500 MG tablet Commonly known as:  NAPROSYN     TAKE these medications   amLODipine 5 MG tablet Commonly known as:  NORVASC Take 5 mg by mouth daily.   atorvastatin 40 MG tablet Commonly known as:  LIPITOR Take 40 mg by mouth daily.   b complex-vitamin c-folic acid 0.8 MG Tabs tablet Take 1 tablet by mouth daily.   budesonide-formoterol 160-4.5 MCG/ACT inhaler Commonly known as:  SYMBICORT Inhale 2 puffs into the lungs daily.   calcium acetate 667 MG capsule Commonly known as:  PHOSLO Take 2,001 mg by mouth 3 (three) times daily before meals.   cinacalcet 30 MG tablet Commonly known as:  SENSIPAR Take 30 mg by mouth daily with supper.   fluconazole 100 MG tablet Commonly known as:  DIFLUCAN Take 1 tablet (100 mg total) by mouth daily. Start taking on:  05/11/2016   gabapentin 300 MG capsule Commonly known as:  NEURONTIN Take 1 capsule (300 mg total) by mouth daily. What changed:  when to take this   hydrOXYzine 25 MG capsule Commonly known as:  VISTARIL Take 1 capsule (25 mg total) by mouth 3 (three) times daily as needed for itching.   labetalol 100  MG tablet Commonly known as:  NORMODYNE Take 1 tablet (100 mg total) by mouth daily.   multivitamin with minerals Tabs tablet Take 1 tablet by mouth daily. Men's One a Day   nitroGLYCERIN 0.4 MG SL tablet Commonly known as:  NITROSTAT Place 1 tablet (0.4 mg total) under the tongue every 5 (five) minutes as needed. What changed:  reasons to take this   oxyCODONE-acetaminophen 5-325 MG tablet Commonly known as:  ROXICET Take 1-2 tablets by mouth every 4 (four) hours as needed for severe pain.   pantoprazole 40 MG tablet Commonly known as:  PROTONIX Take 1 tablet (40 mg total) by mouth  daily.      Follow-up Information    [name], MD. Call.   Specialty:  Gastroenterology Why:  call as needed for gastro intestinal issues.  Contact information: [address] [place] [address] [phone]        Elyse Jarvis, MD. Schedule an appointment as soon as possible for a visit in 1 week(s).   Specialty:  Family Medicine Contact information: [address] [place] [address] [phone]          No Known Allergies  Consultations:   GI and nephrology  Other Procedures/Studies: Dg Ribs Unilateral W/chest Right  Result Date: 04/21/2016 CLINICAL DATA:  Syncope during dialysis, slipped in shower this morning and fell striking RIGHT lateral ribs, history hypertension, coronary disease, cirrhosis, end- stage renal disease alcohol and drug abuse EXAM: RIGHT RIBS AND CHEST - 3+ VIEW COMPARISON:  Chest radiograph 03/20/2016 FINDINGS: Enlargement of cardiac silhouette post CABG with pulmonary vascular congestion Atherosclerotic calcification aorta. Minimal chronic accentuation of interstitial markings, stable. No acute failure or consolidation. No pleural effusion or pneumothorax. Osseous mineralization grossly normal. BB placed at site of symptoms lower lateral RIGHT chest. Nondisplaced fractures of the lateral RIGHT ninth tenth ribs identified. IMPRESSION: Enlargement of cardiac silhouette with pulmonary vascular congestion post CABG. No acute RIGHT infiltrate or pneumothorax. Nondisplaced fractures lateral RIGHT ninth and tenth ribs. Electronically Signed   By: Lavonia Dana M.D.   On: 04/21/2016 15:56   US Abdomen Limited  Result Date: 05/06/2016 CLINICAL DATA:  56 year old male with cirrhosis and end-stage renal disease. Abdominal pain. EXAM: LIMITED ABDOMEN ULTRASOUND FOR ASCITES TECHNIQUE: Limited ultrasound survey for ascites was performed in all four abdominal quadrants. COMPARISON:  03/09/2016. FINDINGS: Hepatomegaly and small volume of  ascites fluid is identified in the 4 quadrants of the abdomen. The quantity is less than that on 03/09/2016 and insufficient for paracentesis. IMPRESSION: Small volume of ascites, quantity insufficient for paracentesis. Electronically Signed   By: Genevie Ann M.D.   On: 05/06/2016 11:16   US Venous Img Lower Unilateral Left  Result Date: 04/16/2016 CLINICAL DATA:  Left leg pain and swelling for 1 day. Concern for DVT. EXAM: LEFT LOWER EXTREMITY VENOUS DOPPLER ULTRASOUND TECHNIQUE: Gray-scale sonography with graded compression, as well as color Doppler and duplex ultrasound were performed to evaluate the lower extremity deep venous systems from the level of the common femoral vein and including the common femoral, femoral, profunda femoral, popliteal and calf veins including the posterior tibial, peroneal and gastrocnemius veins when visible. The superficial great saphenous vein was also interrogated. Spectral Doppler was utilized to evaluate flow at rest and with distal augmentation maneuvers in the common femoral, femoral and popliteal veins. COMPARISON:  Lower extremity duplex 06/18/2011 FINDINGS: Contralateral Common Femoral Vein: Respiratory phasicity is normal and symmetric with the symptomatic side. No evidence of thrombus.  Normal compressibility. Common Femoral Vein: No evidence of thrombus. Normal compressibility, respiratory phasicity and response to augmentation. Saphenofemoral Junction: No evidence of thrombus. Normal compressibility and flow on color Doppler imaging. Profunda Femoral Vein: No evidence of thrombus. Normal compressibility and flow on color Doppler imaging. Femoral Vein: No evidence of thrombus. Normal compressibility, respiratory phasicity and response to augmentation. Popliteal Vein: No evidence of thrombus. Normal compressibility, respiratory phasicity and response to augmentation. Calf Veins: No evidence of thrombus. Normal compressibility and flow on color Doppler imaging. Superficial  Great Saphenous Vein: No evidence of thrombus. Normal compressibility and flow on color Doppler imaging. Venous Reflux:  None. Other Findings: Subcutaneous edema noted about the popliteal area and calf. IMPRESSION: No evidence of left lower extremity deep venous thrombosis. Soft tissue edema of the calf. Electronically Signed   By: Jeb Levering M.D.   On: 04/16/2016 01:16      TODAY-DAY OF DISCHARGE:  Subjective:   Stanley [name] today has no headache,no chest abdominal pain,no new weakness tingling or numbness, feels much better wants to go home today.   Objective:   Blood pressure 129/64, pulse 79, temperature 98.1 F (36.7 C), temperature source Oral, resp. rate 18, height 6\' 3"  (1.905 m), weight 84.2 kg (185 lb 10 oz), SpO2 100 %.  Intake/Output Summary (Last 24 hours) at 05/10/16 1535 Last data filed at 05/10/16 1439  Gross per 24 hour  Intake              860 ml  Output             3000 ml  Net            -2140 ml   Filed Weights   05/10/16 0456 05/10/16 0848 05/10/16 1300  Weight: 87.6 kg (193 lb 1.6 oz) 87.7 kg (193 lb 5.5 oz) 84.2 kg (185 lb 10 oz)    Exam: Awake Alert, Oriented *3, No new F.N deficits, Normal affect [place].AT,PERRAL Supple Neck,No JVD, No cervical lymphadenopathy appriciated.  Symmetrical Chest wall movement, Good air movement bilaterally, CTAB RRR,No Gallops,Rubs or new Murmurs, No Parasternal Heave +ve B.Sounds, Abd Soft, Non tender, No organomegaly appriciated, No rebound -guarding or rigidity. No Cyanosis, Clubbing or edema, No new Rash or bruise   PERTINENT RADIOLOGIC STUDIES: Dg Ribs Unilateral W/chest Right  Result Date: 04/21/2016 CLINICAL DATA:  Syncope during dialysis, slipped in shower this morning and fell striking RIGHT lateral ribs, history hypertension, coronary disease, cirrhosis, end- stage renal disease alcohol and drug abuse EXAM: RIGHT RIBS AND CHEST - 3+ VIEW COMPARISON:  Chest radiograph 03/20/2016 FINDINGS: Enlargement of  cardiac silhouette post CABG with pulmonary vascular congestion Atherosclerotic calcification aorta. Minimal chronic accentuation of interstitial markings, stable. No acute failure or consolidation. No pleural effusion or pneumothorax. Osseous mineralization grossly normal. BB placed at site of symptoms lower lateral RIGHT chest. Nondisplaced fractures of the lateral RIGHT ninth tenth ribs identified. IMPRESSION: Enlargement of cardiac silhouette with pulmonary vascular congestion post CABG. No acute RIGHT infiltrate or pneumothorax. Nondisplaced fractures lateral RIGHT ninth and tenth ribs. Electronically Signed   By: Lavonia Dana M.D.   On: 04/21/2016 15:56   US Abdomen Limited  Result Date: 05/06/2016 CLINICAL DATA:  56 year old male with cirrhosis and end-stage renal disease. Abdominal pain. EXAM: LIMITED ABDOMEN ULTRASOUND FOR ASCITES TECHNIQUE: Limited ultrasound survey for ascites was performed in all four abdominal quadrants. COMPARISON:  03/09/2016. FINDINGS: Hepatomegaly and small volume of ascites fluid is identified in the 4 quadrants of the abdomen. The quantity is  less than that on 03/09/2016 and insufficient for paracentesis. IMPRESSION: Small volume of ascites, quantity insufficient for paracentesis. Electronically Signed   By: Genevie Ann M.D.   On: 05/06/2016 11:16   US Venous Img Lower Unilateral Left  Result Date: 04/16/2016 CLINICAL DATA:  Left leg pain and swelling for 1 day. Concern for DVT. EXAM: LEFT LOWER EXTREMITY VENOUS DOPPLER ULTRASOUND TECHNIQUE: Gray-scale sonography with graded compression, as well as color Doppler and duplex ultrasound were performed to evaluate the lower extremity deep venous systems from the level of the common femoral vein and including the common femoral, femoral, profunda femoral, popliteal and calf veins including the posterior tibial, peroneal and gastrocnemius veins when visible. The superficial great saphenous vein was also interrogated. Spectral Doppler  was utilized to evaluate flow at rest and with distal augmentation maneuvers in the common femoral, femoral and popliteal veins. COMPARISON:  Lower extremity duplex 06/18/2011 FINDINGS: Contralateral Common Femoral Vein: Respiratory phasicity is normal and symmetric with the symptomatic side. No evidence of thrombus. Normal compressibility. Common Femoral Vein: No evidence of thrombus. Normal compressibility, respiratory phasicity and response to augmentation. Saphenofemoral Junction: No evidence of thrombus. Normal compressibility and flow on color Doppler imaging. Profunda Femoral Vein: No evidence of thrombus. Normal compressibility and flow on color Doppler imaging. Femoral Vein: No evidence of thrombus. Normal compressibility, respiratory phasicity and response to augmentation. Popliteal Vein: No evidence of thrombus. Normal compressibility, respiratory phasicity and response to augmentation. Calf Veins: No evidence of thrombus. Normal compressibility and flow on color Doppler imaging. Superficial Great Saphenous Vein: No evidence of thrombus. Normal compressibility and flow on color Doppler imaging. Venous Reflux:  None. Other Findings: Subcutaneous edema noted about the popliteal area and calf. IMPRESSION: No evidence of left lower extremity deep venous thrombosis. Soft tissue edema of the calf. Electronically Signed   By: Jeb Levering M.D.   On: 04/16/2016 01:16     PERTINENT LAB RESULTS: CBC:  Recent Labs  05/09/16 1621 05/10/16 0434 05/10/16 1347  WBC 6.2 6.7  --   HGB 8.0* 8.0* 9.5*  HCT 27.3* 27.1* 28.0*  PLT 266 302  --    CMET CMP     Component Value Date/Time   NA 136 05/10/2016 1347   NA 143 06/10/2014 1251   K 3.7 05/10/2016 1347   K 5.1 06/10/2014 1251   CL 93 (L) 05/10/2016 0434   CL 102 06/10/2014 1251   CO2 21 (L) 05/10/2016 0434   CO2 30 06/10/2014 1251   GLUCOSE 83 05/10/2016 1347   GLUCOSE 126 (H) 06/10/2014 1251   BUN 54 (H) 05/10/2016 0434   BUN 20  06/10/2014 1251   CREATININE 10.53 (H) 05/10/2016 0434   CREATININE 5.42 (H) 06/10/2014 1251   CALCIUM 9.7 05/10/2016 0434   CALCIUM 8.9 06/10/2014 1251   PROT 7.0 05/05/2016 2351   PROT 7.3 06/10/2014 1251   ALBUMIN 3.5 05/10/2016 0434   ALBUMIN 3.4 (L) 06/10/2014 1251   AST 23 05/05/2016 2351   AST 53 (H) 06/10/2014 1251   ALT 23 05/05/2016 2351   ALT 32 06/10/2014 1251   ALKPHOS 159 (H) 05/05/2016 2351   ALKPHOS 156 (H) 06/10/2014 1251   BILITOT 1.7 (H) 05/05/2016 2351   BILITOT 1.9 (H) 06/10/2014 1251   GFRNONAA 5 (L) 05/10/2016 0434   GFRNONAA 11 (L) 06/10/2014 1251   GFRAA 5 (L) 05/10/2016 0434   GFRAA 13 (L) 06/10/2014 1251    GFR Estimated Creatinine Clearance: 9.2 mL/min (by C-G formula based on  SCr of 10.53 mg/dL (H)). No results for input(s): LIPASE, AMYLASE in the last 72 hours. No results for input(s): CKTOTAL, CKMB, CKMBINDEX, TROPONINI in the last 72 hours. Invalid input(s): POCBNP No results for input(s): DDIMER in the last 72 hours. No results for input(s): HGBA1C in the last 72 hours. No results for input(s): CHOL, HDL, LDLCALC, TRIG, CHOLHDL, LDLDIRECT in the last 72 hours. No results for input(s): TSH, T4TOTAL, T3FREE, THYROIDAB in the last 72 hours.  Invalid input(s): FREET3 No results for input(s): VITAMINB12, FOLATE, FERRITIN, TIBC, IRON, RETICCTPCT in the last 72 hours. Coags: No results for input(s): INR in the last 72 hours.  Invalid input(s): PT Microbiology: Recent Results (from the past 240 hour(s))  Body fluid culture     Status: None   Collection Time: 05/05/16  7:34 PM  Result Value Ref Range Status   Specimen Description ASCITIES  Final   Special Requests NONE  Final   Gram Stain   Final    RARE WBC PRESENT, PREDOMINANTLY MONONUCLEAR NO ORGANISMS SEEN    Culture   Final    No growth aerobically or anaerobically. Performed at [facility], [place] [address], [place], [place] [address]    Report Status 05/10/2016 FINAL  Final     FURTHER DISCHARGE INSTRUCTIONS:  Get Medicines reviewed and adjusted: Please take all your medications with you for your next visit with your Primary MD  Laboratory/radiological data: Please request your Primary MD to go over all hospital tests and procedure/radiological results at the follow up, please ask your Primary MD to get all Hospital records sent to his/her office.  In some cases, they will be blood work, cultures and biopsy results pending at the time of your discharge. Please request that your primary care M.D. goes through all the records of your hospital data and follows up on these results.  Also Note the following: If you experience worsening of your admission symptoms, develop shortness of breath, life threatening emergency, suicidal or homicidal thoughts you must seek medical attention immediately by calling 911 or calling your MD immediately  if symptoms less severe.  You must read complete instructions/literature along with all the possible adverse reactions/side effects for all the Medicines you take and that have been prescribed to you. Take any new Medicines after you have completely understood and accpet all the possible adverse reactions/side effects.   Do not drive when taking Pain medications or sleeping medications (Benzodaizepines)  Do not take more than prescribed Pain, Sleep and Anxiety Medications. It is not advisable to combine anxiety,sleep and pain medications without talking with your primary care practitioner  Special Instructions: If you have smoked or chewed Tobacco  in the last 2 yrs please stop smoking, stop any regular Alcohol  and or any Recreational drug use.  Wear Seat belts while driving.  Please note: You were cared for by a hospitalist during your hospital stay. Once you are discharged, your primary care physician will handle any further medical issues. Please note that NO REFILLS for any discharge medications will be authorized once you  are discharged, as it is imperative that you return to your primary care physician (or establish a relationship with a primary care physician if you do not have one) for your post hospital discharge needs so that they can reassess your need for medications and monitor your lab values.  Total Time spent coordinating discharge including counseling, education and face to face time equals 45 minutes.  SignedOren Binet 05/10/2016 3:35 PM

## 2016-05-10 NOTE — Op Note (Signed)
[facility] Patient Name: Stanley [name] Procedure Date : 05/10/2016 MRN: [id number] Attending MD: Jerene Bears , MD Date of Birth: 27-Sep-1960 CSN: [id number] Age: 56 Admit Type: Inpatient Procedure:                Small bowel enteroscopy Indications:              Melena, + bleeding source per VCE in proximal small                            bowel Providers:                Lajuan Lines. Hilarie Fredrickson, MD, Dortha Schwalbe RN, RN, Alfonso Patten, Technician, Claybon Jabs, CRNA Referring MD:             [facility] Medicines:                Monitored Anesthesia Care Complications:            No immediate complications. On scope withdrawal                            there was small volume scant red blood in the                            posterior oropharynx which cleared with suction and                            without evidence of further active bleeding. Likely                            related to trauma in the posterior OP. Estimated Blood Loss:     Estimated blood loss: Minimal. Procedure:                Pre-Anesthesia Assessment:                           - Prior to the procedure, a History and Physical                            was performed, and patient medications and                            allergies were reviewed. The patient's tolerance of                            previous anesthesia was also reviewed. The risks                            and benefits of the procedure and the sedation                            options and risks were discussed with the patient.  All questions were answered, and informed consent                            was obtained. Prior Anticoagulants: The patient has                            taken no previous anticoagulant or antiplatelet                            agents. ASA Grade Assessment: III - A patient with                            severe systemic disease. After reviewing  the risks                            and benefits, the patient was deemed in                            satisfactory condition to undergo the procedure.                           After obtaining informed consent, the endoscope was                            passed under direct vision. Throughout the                            procedure, the patient's blood pressure, pulse, and                            oxygen saturations were monitored continuously. The                            [id number] ([id number]) scope was introduced through                            the mouth and advanced to the proximal jejunum. The                            small bowel enteroscopy was accomplished without                            difficulty. The patient tolerated the procedure                            well. Scope In: Scope Out: Findings:      Patchy candidiasis was found in the upper third of the esophagus.      The exam of the esophagus was otherwise normal.      Localized minimal inflammation characterized by erythema was found in       the gastric antrum and in the prepyloric region of the stomach.      There was no evidence of significant pathology in the entire examined       duodenum.  A single angiodysplastic lesion with no bleeding was found in the       proximal jejunum. Fulguration to ablate the lesion by argon plasma was       successful.      Exam of the proximal jejunum was otherwise normal. Impression:               - Monilial esophagitis.                           - Mild, non-bleeding, antral gastritis.                           - Normal examined duodenum.                           - A single non-bleeding angiodysplastic lesion in                            the jejunum. Treated with argon plasma coagulation                            (APC).                           - No specimens collected. Moderate Sedation:      N/A Recommendation:           - Return patient to hospital ward for  ongoing care.                           - Once daily PPI.                           - Monitor Hgb and for signs of recurrent bleeding.                           - Treat Candida esophagitis with fluconazole 200 mg                            x 1 day, 100 mg x 13 days                           - GI available, call with questions. Procedure Code(s):        --- Professional ---                           [phone], Small intestinal endoscopy, enteroscopy                            beyond second portion of duodenum, not including                            ileum; with ablation of tumor(s), polyp(s), or                            other lesion(s) not amenable to removal by hot  biopsy forceps, bipolar cautery or snare technique Diagnosis Code(s):        --- Professional ---                           B37.81, Candidal esophagitis                           K29.70, Gastritis, unspecified, without bleeding                           K55.20, Angiodysplasia of colon without hemorrhage                           K92.1, Melena (includes Hematochezia) CPT copyright 2016 American [employer] Association. All rights reserved. The codes documented in this report are preliminary and upon coder review may  be revised to meet current compliance requirements. Jerene Bears, MD 05/10/2016 2:49:09 PM This report has been signed electronically. Number of Addenda: 0

## 2016-05-10 NOTE — Anesthesia Preprocedure Evaluation (Addendum)
Anesthesia Evaluation  Patient identified by MRN, date of birth, ID band Patient awake    Reviewed: Allergy & Precautions, H&P , NPO status , Patient's Chart, lab work & pertinent test results  Airway Mallampati: II  TM Distance: >3 FB Neck ROM: Full    Dental no notable dental hx. (+) Dental Advisory Given, Partial Upper   Pulmonary Current Smoker,    Pulmonary exam normal breath sounds clear to auscultation       Cardiovascular hypertension, Pt. on medications and Pt. on home beta blockers + CAD, + CABG and + Peripheral Vascular Disease   Rhythm:Regular Rate:Normal     Neuro/Psych negative neurological ROS  negative psych ROS   GI/Hepatic Neg liver ROS, GERD  Medicated and Controlled,  Endo/Other  diabetes  Renal/GU ESRF and DialysisRenal disease  negative genitourinary   Musculoskeletal   Abdominal   Peds  Hematology negative hematology ROS (+) anemia ,   Anesthesia Other Findings   Reproductive/Obstetrics negative OB ROS                          Anesthesia Physical Anesthesia Plan  ASA: III  Anesthesia Plan: MAC   Post-op Pain Management:    Induction: Intravenous  Airway Management Planned: Nasal Cannula  Additional Equipment:   Intra-op Plan:   Post-operative Plan:   Informed Consent: I have reviewed the patients History and Physical, chart, labs and discussed the procedure including the risks, benefits and alternatives for the proposed anesthesia with the patient or authorized representative who has indicated his/her understanding and acceptance.   Dental advisory given  Plan Discussed with: CRNA, Anesthesiologist and Surgeon  Anesthesia Plan Comments:        Anesthesia Quick Evaluation

## 2016-05-10 NOTE — Progress Notes (Signed)
PROGRESS NOTE        PATIENT DETAILS Name: Stanley [name] Age: 56 y.o. Sex: male Date of Birth: Jun 11, 1960 Admit Date: 05/05/2016 Admitting Physician Vianne Bulls, MD ACZ:[id number] Ladoris Gene, MD  Brief Narrative: Patient is a 56 y.o. male with history of ESRD on hemodialysis, recurrent melena intermittently over the past several months with negative endoscopic workup (except for AVM's) at [facility]-history admitted for melena, GI consulted-workup in progress. See details below.  Subjective: Lying comfortably in bed-claims no further black stools.  Assessment/Plan: Melena: Likely due to small bowel bleeding-recent EGD on 2/4 was negative. Has known history of AVMs per chart review. Capsule endoscopy completed on 2/24-which showed a proximal small bowel bleed. EGD/enteroscopy attempted on 2/26-however found to have large amount of food (residue) in stomach, obstructing visualization of stomach. GI planning on repeat EGD with enteroscopy on 2/27. Avoid NSAIDs/aspirin given recurrent GI bleeding.  Gastroparesis: EGD on 2/26- Patient was kept NPO after midnight, large amount of food (residue) was found in the stomach. He was started on IV reglan.   Hyperkalemia: Potassium of 6.0 on 2/27-he is due for dialysis and will have dialysis before going for EGD on 2/27.   Acute/subacute on chronic blood loss anemia: due to above. Hb stable- Continue to follow hemoglobin and transfuse accordingly.  ESRD: Nephrology following-HD TTS.  Recurrent ascites: Likely multifactorial from known liver cirrhosis and ESRD. Limited abdominal ultrasound on 2/23 showed very small volume of ascites-with insufficient amount for paracentesis.  History of alcoholic liver cirrhosis/remote alcohol use: Compensated-no signs of withdrawal  Hypertension: Controlled, continue labetalol, amlodipine.  GERD: Continue PPI.  Hx of asthma: Stable-clear lungs-continue Symbicort and as needed  albuterol.  DVT Prophylaxis: SCD  Code Status: Full code  Family Communication: None at bedside  Disposition Plan: Remain inpatient-requires several days of hospitalization prior to discharge.  Antimicrobial agents: Anti-infectives    None     Procedures: 2/24>>Capsule endoscopy 2/26>>EGD  CONSULTS:  GI and nephrology  Time spent: 25- minutes-Greater than 50% of this time was spent in counseling, explanation of diagnosis, planning of further management, and coordination of care.  MEDICATIONS: Scheduled Meds: . amLODipine  5 mg Oral Daily  . atorvastatin  40 mg Oral q1800  . calcium acetate  2,001 mg Oral TID WC  . cinacalcet  30 mg Oral Q supper  . darbepoetin (ARANESP) injection - DIALYSIS  60 mcg Intravenous Q Sat-HD  . doxercalciferol  7 mcg Intravenous Q T,Th,Sa-HD  . gabapentin  300 mg Oral QHS  . labetalol  100 mg Oral Daily  . mometasone-formoterol  2 puff Inhalation BID  . multivitamin  1 tablet Oral QHS  . pantoprazole (PROTONIX) IV  40 mg Intravenous Q12H  . sodium chloride flush  3 mL Intravenous Q12H  . sodium chloride flush  3 mL Intravenous Q12H   Continuous Infusions: PRN Meds:.sodium chloride, sodium chloride, sodium chloride, acetaminophen **OR** acetaminophen, alteplase, alum & mag hydroxide-simeth, heparin, lidocaine (PF), lidocaine-prilocaine, menthol-cetylpyridinium, morphine injection, ondansetron **OR** ondansetron (ZOFRAN) IV, oxyCODONE-acetaminophen, pentafluoroprop-tetrafluoroeth, sodium chloride flush   PHYSICAL EXAM: Vital signs: Vitals:   05/09/16 2114 05/10/16 0456 05/10/16 0848 05/10/16 0900  BP:  134/82 124/68 127/80  Pulse: 64 73 80 66  Resp: 18 18 11    Temp:  98 F (36.7 C) 98.2 F (36.8 C)   TempSrc:  Oral Oral   SpO2: 99% 100%  98%   Weight:  87.6 kg (193 lb 1.6 oz) 87.7 kg (193 lb 5.5 oz)   Height:       Filed Weights   05/09/16 0748 05/10/16 0456 05/10/16 0848  Weight: 86.6 kg (191 lb) 87.6 kg (193 lb 1.6 oz) 87.7  kg (193 lb 5.5 oz)   Body mass index is 24.17 kg/m.   General appearance :Awake, alert, not in any distress. Speech Clear. Eyes:, pupils equally reactive to light and accomodation,no scleral icterus. HEENT: Atraumatic and Normocephalic Neck: supple, no JVD. No cervical lymphadenopathy.  Resp:Good air entry bilaterally, no added sounds  CVS: S1 S2 regular, no murmurs.  GI: Bowel sounds present, Non tender and not distended with no gaurding, rigidity or rebound.No organomegaly Extremities: B/L Lower Ext shows no edema, both legs are warm to touch Neurology:  speech clear,Non focal, sensation is grossly intact. Psychiatric: Normal judgment and insight. Alert and oriented x 3. Normal mood. Musculoskeletal:No digital cyanosis Skin:No Rash, warm and dry Wounds:N/A  I have personally reviewed following labs and imaging studies  LABORATORY DATA: CBC:  Recent Labs Lab 05/07/16 0907 05/08/16 0300 05/09/16 0203 05/09/16 1621 05/10/16 0434  WBC 7.2 6.5 7.2 6.2 6.7  HGB 8.3* 8.0* 7.7* 8.0* 8.0*  HCT 28.4* 27.6* 26.0* 27.3* 27.1*  MCV 94.4 93.6 93.5 92.5 91.6  PLT 306 296 273 266 809    Basic Metabolic Panel:  Recent Labs Lab 05/05/16 1813 05/05/16 2351 05/07/16 0907 05/08/16 0300 05/10/16 0434  NA 133* 133* 132* 133* 130*  K 3.1* 4.0 5.7* 4.7 6.0*  CL 93* 96* 94* 93* 93*  CO2 28 24 19* 25 21*  GLUCOSE 140* 92 80 122* 89  BUN 33* 35* 49* 36* 54*  CREATININE 5.30* 5.93* 8.13* 7.31* 10.53*  CALCIUM 9.1 9.7 9.4 9.5 9.7  MG  --  2.2  --   --   --   PHOS  --   --  7.5* 7.3* 7.7*    GFR: Estimated Creatinine Clearance: 9.3 mL/min (by C-G formula based on SCr of 10.53 mg/dL (H)).  Liver Function Tests:  Recent Labs Lab 05/05/16 1813 05/05/16 2351 05/07/16 0907 05/08/16 0300 05/10/16 0434  AST 26 23  --   --   --   ALT 24 23  --   --   --   ALKPHOS 166* 159*  --   --   --   BILITOT 1.5* 1.7*  --   --   --   PROT 7.3 7.0  --   --   --   ALBUMIN 3.5 3.3* 3.4* 3.4*  3.5   No results for input(s): LIPASE, AMYLASE in the last 168 hours. No results for input(s): AMMONIA in the last 168 hours.  Coagulation Profile:  Recent Labs Lab 05/05/16 1932 05/05/16 2351  INR 1.13 1.22    Cardiac Enzymes: No results for input(s): CKTOTAL, CKMB, CKMBINDEX, TROPONINI in the last 168 hours.  BNP (last 3 results) No results for input(s): PROBNP in the last 8760 hours.  HbA1C: No results for input(s): HGBA1C in the last 72 hours.  CBG:  Recent Labs Lab 05/05/16 2125  GLUCAP 84    Lipid Profile: No results for input(s): CHOL, HDL, LDLCALC, TRIG, CHOLHDL, LDLDIRECT in the last 72 hours.  Thyroid Function Tests: No results for input(s): TSH, T4TOTAL, FREET4, T3FREE, THYROIDAB in the last 72 hours.  Anemia Panel: No results for input(s): VITAMINB12, FOLATE, FERRITIN, TIBC, IRON, RETICCTPCT in the last 72 hours.  Urine analysis:  Component Value Date/Time   COLORURINE YELLOW (A) 05/03/2015 0815   APPEARANCEUR CLEAR (A) 05/03/2015 0815   APPEARANCEUR Clear 11/13/2013 1314   LABSPEC 1.008 05/03/2015 0815   LABSPEC 1.014 11/13/2013 1314   PHURINE 9.0 (H) 05/03/2015 0815   GLUCOSEU NEGATIVE 05/03/2015 0815   GLUCOSEU 50 mg/dL 11/13/2013 1314   HGBUR NEGATIVE 05/03/2015 0815   BILIRUBINUR NEGATIVE 05/03/2015 0815   BILIRUBINUR Negative 11/13/2013 1314   KETONESUR NEGATIVE 05/03/2015 0815   PROTEINUR 100 (A) 05/03/2015 0815   UROBILINOGEN 0.2 06/22/2014 1230   NITRITE NEGATIVE 05/03/2015 0815   LEUKOCYTESUR NEGATIVE 05/03/2015 0815   LEUKOCYTESUR Negative 11/13/2013 1314    Sepsis Labs: Lactic Acid, Venous    Component Value Date/Time   LATICACIDVEN 1.3 04/06/2015 0134    MICROBIOLOGY: Recent Results (from the past 240 hour(s))  Body fluid culture     Status: None (Preliminary result)   Collection Time: 05/05/16  7:34 PM  Result Value Ref Range Status   Specimen Description ASCITIES  Final   Special Requests NONE  Final   Gram Stain    Final    RARE WBC PRESENT, PREDOMINANTLY MONONUCLEAR NO ORGANISMS SEEN    Culture   Final    NO GROWTH 3 DAYS Performed at [facility], [place] [address] [place], [place] [address]    Report Status PENDING  Incomplete    RADIOLOGY STUDIES/RESULTS: Dg Ribs Unilateral W/chest Right  Result Date: 04/21/2016 CLINICAL DATA:  Syncope during dialysis, slipped in shower this morning and fell striking RIGHT lateral ribs, history hypertension, coronary disease, cirrhosis, end- stage renal disease alcohol and drug abuse EXAM: RIGHT RIBS AND CHEST - 3+ VIEW COMPARISON:  Chest radiograph 03/20/2016 FINDINGS: Enlargement of cardiac silhouette post CABG with pulmonary vascular congestion Atherosclerotic calcification aorta. Minimal chronic accentuation of interstitial markings, stable. No acute failure or consolidation. No pleural effusion or pneumothorax. Osseous mineralization grossly normal. BB placed at site of symptoms lower lateral RIGHT chest. Nondisplaced fractures of the lateral RIGHT ninth tenth ribs identified. IMPRESSION: Enlargement of cardiac silhouette with pulmonary vascular congestion post CABG. No acute RIGHT infiltrate or pneumothorax. Nondisplaced fractures lateral RIGHT ninth and tenth ribs. Electronically Signed   By: Lavonia Dana M.D.   On: 04/21/2016 15:56   US Abdomen Limited  Result Date: 05/06/2016 CLINICAL DATA:  56 year old male with cirrhosis and end-stage renal disease. Abdominal pain. EXAM: LIMITED ABDOMEN ULTRASOUND FOR ASCITES TECHNIQUE: Limited ultrasound survey for ascites was performed in all four abdominal quadrants. COMPARISON:  03/09/2016. FINDINGS: Hepatomegaly and small volume of ascites fluid is identified in the 4 quadrants of the abdomen. The quantity is less than that on 03/09/2016 and insufficient for paracentesis. IMPRESSION: Small volume of ascites, quantity insufficient for paracentesis. Electronically Signed   By: Genevie Ann M.D.   On: 05/06/2016 11:16   US  Venous Img Lower Unilateral Left  Result Date: 04/16/2016 CLINICAL DATA:  Left leg pain and swelling for 1 day. Concern for DVT. EXAM: LEFT LOWER EXTREMITY VENOUS DOPPLER ULTRASOUND TECHNIQUE: Gray-scale sonography with graded compression, as well as color Doppler and duplex ultrasound were performed to evaluate the lower extremity deep venous systems from the level of the common femoral vein and including the common femoral, femoral, profunda femoral, popliteal and calf veins including the posterior tibial, peroneal and gastrocnemius veins when visible. The superficial great saphenous vein was also interrogated. Spectral Doppler was utilized to evaluate flow at rest and with distal augmentation maneuvers in the common femoral, femoral and popliteal veins.  COMPARISON:  Lower extremity duplex 06/18/2011 FINDINGS: Contralateral Common Femoral Vein: Respiratory phasicity is normal and symmetric with the symptomatic side. No evidence of thrombus. Normal compressibility. Common Femoral Vein: No evidence of thrombus. Normal compressibility, respiratory phasicity and response to augmentation. Saphenofemoral Junction: No evidence of thrombus. Normal compressibility and flow on color Doppler imaging. Profunda Femoral Vein: No evidence of thrombus. Normal compressibility and flow on color Doppler imaging. Femoral Vein: No evidence of thrombus. Normal compressibility, respiratory phasicity and response to augmentation. Popliteal Vein: No evidence of thrombus. Normal compressibility, respiratory phasicity and response to augmentation. Calf Veins: No evidence of thrombus. Normal compressibility and flow on color Doppler imaging. Superficial Great Saphenous Vein: No evidence of thrombus. Normal compressibility and flow on color Doppler imaging. Venous Reflux:  None. Other Findings: Subcutaneous edema noted about the popliteal area and calf. IMPRESSION: No evidence of left lower extremity deep venous thrombosis. Soft tissue edema  of the calf. Electronically Signed   By: Jeb Levering M.D.   On: 04/16/2016 01:16     LOS: 5 days   Kyra Leyland, PA-S  If 7PM-7AM, please contact night-coverage [email or web address] Password [facility] 05/10/2016, 9:42 AM  Attending MD note  Patient was seen, examined,treatment plan was discussed with the PA-S.  I have personally reviewed the clinical findings, lab, imaging studies and management of this patient in detail. I agree with the documentation, as recorded by the PA-S.   Claims no further. Lying comfortably in bed   On Exam: Gen. exam: Awake, alert, not in any distress Chest: Good air entry bilaterally, no rhonchi or rales CVS: S1-S2 regular, no murmurs Abdomen: Soft, nontender and nondistended Neurology: Non-focal Skin: No rash or lesions  Impression: Small bowel bleeding with acute blood loss anemia ESRD Alcoholic liver cirrhosis Hyperkalemia  Plan: Hyperkalemia-spoke with nephrology-now undergoing urgent hemodialysis Spoke with GI M.D.-Dr. Elyn Peers are for EGD with enteroscopy later today after HD is completed. Hemoglobin remained stable  Rest as above  Nena Alexander MD

## 2016-05-10 NOTE — Progress Notes (Signed)
Pt has been discharged home with family. IV and telemetry box removed. Pt received discharge instructions and all questions were answered. Pt left with all of his belongings. Pt ambulated off the unit with family, per pt preference.   Grant Fontana BSN, RN

## 2016-05-10 NOTE — Anesthesia Postprocedure Evaluation (Signed)
Anesthesia Post Note  Patient: Stanley [name]  Procedure(s) Performed: Procedure(s) (LRB): ENTEROSCOPY (N/A)  Patient location during evaluation: PACU Anesthesia Type: MAC Level of consciousness: awake and alert Pain management: pain level controlled Vital Signs Assessment: post-procedure vital signs reviewed and stable Respiratory status: spontaneous breathing, nonlabored ventilation, respiratory function stable and patient connected to nasal cannula oxygen Cardiovascular status: stable and blood pressure returned to baseline Anesthetic complications: no       Last Vitals:  Vitals:   05/10/16 1510 05/10/16 1531  BP: 121/62 129/64  Pulse: 74 79  Resp: (!) 7 18  Temp:  36.7 C    Last Pain:  Vitals:   05/10/16 1553  TempSrc:   PainSc: 6                  Lavon Bothwell,W. EDMOND

## 2016-05-10 NOTE — Procedures (Signed)
I have seen and examined this patient and agree with the plan of care  Patient brought to dialysis   K 6  Hb 8   BP 120/70    Stanley [name] W 05/10/2016, 9:42 AM

## 2016-05-10 NOTE — Care Management Important Message (Signed)
Important Message  Patient Details  Name: Stanley [name] MRN: [id number] Date of Birth: [birth year]   Medicare Important Message Given:  Yes    Nathen May 05/10/2016, 10:43 AM

## 2016-05-11 ENCOUNTER — Encounter (HOSPITAL_COMMUNITY): Payer: Self-pay | Admitting: Internal Medicine

## 2016-05-11 ENCOUNTER — Ambulatory Visit
Admission: RE | Admit: 2016-05-11 | Discharge: 2016-05-11 | Disposition: A | Discharge status: Home / Self Care | Payer: Medicare Other | Source: Ambulatory Visit | Attending: Internal Medicine | Admitting: Internal Medicine

## 2016-05-11 DIAGNOSIS — N186 End stage renal disease: Secondary | ICD-10-CM | POA: Diagnosis not present

## 2016-05-11 DIAGNOSIS — Z992 Dependence on renal dialysis: Secondary | ICD-10-CM | POA: Diagnosis not present

## 2016-05-12 DIAGNOSIS — N186 End stage renal disease: Secondary | ICD-10-CM | POA: Diagnosis not present

## 2016-05-12 DIAGNOSIS — N2581 Secondary hyperparathyroidism of renal origin: Secondary | ICD-10-CM | POA: Diagnosis not present

## 2016-05-12 DIAGNOSIS — Z992 Dependence on renal dialysis: Secondary | ICD-10-CM | POA: Diagnosis not present

## 2016-05-12 DIAGNOSIS — D509 Iron deficiency anemia, unspecified: Secondary | ICD-10-CM | POA: Diagnosis not present

## 2016-05-12 DIAGNOSIS — D631 Anemia in chronic kidney disease: Secondary | ICD-10-CM | POA: Diagnosis not present

## 2016-05-14 DIAGNOSIS — Z992 Dependence on renal dialysis: Secondary | ICD-10-CM | POA: Diagnosis not present

## 2016-05-14 DIAGNOSIS — D631 Anemia in chronic kidney disease: Secondary | ICD-10-CM | POA: Diagnosis not present

## 2016-05-14 DIAGNOSIS — D509 Iron deficiency anemia, unspecified: Secondary | ICD-10-CM | POA: Diagnosis not present

## 2016-05-14 DIAGNOSIS — N186 End stage renal disease: Secondary | ICD-10-CM | POA: Diagnosis not present

## 2016-05-14 DIAGNOSIS — N2581 Secondary hyperparathyroidism of renal origin: Secondary | ICD-10-CM | POA: Diagnosis not present

## 2016-05-17 DIAGNOSIS — N186 End stage renal disease: Secondary | ICD-10-CM | POA: Diagnosis not present

## 2016-05-17 DIAGNOSIS — Z992 Dependence on renal dialysis: Secondary | ICD-10-CM | POA: Diagnosis not present

## 2016-05-17 DIAGNOSIS — D509 Iron deficiency anemia, unspecified: Secondary | ICD-10-CM | POA: Diagnosis not present

## 2016-05-17 DIAGNOSIS — N2581 Secondary hyperparathyroidism of renal origin: Secondary | ICD-10-CM | POA: Diagnosis not present

## 2016-05-17 DIAGNOSIS — D631 Anemia in chronic kidney disease: Secondary | ICD-10-CM | POA: Diagnosis not present

## 2016-05-18 ENCOUNTER — Other Ambulatory Visit: Payer: Medicare Other

## 2016-05-18 ENCOUNTER — Encounter: Payer: Self-pay | Admitting: *Deleted

## 2016-05-18 LAB — TYPE AND SCREEN
ABO/RH(D): A POS
Antibody Screen: NEGATIVE

## 2016-05-21 DIAGNOSIS — D631 Anemia in chronic kidney disease: Secondary | ICD-10-CM | POA: Diagnosis not present

## 2016-05-21 DIAGNOSIS — D509 Iron deficiency anemia, unspecified: Secondary | ICD-10-CM | POA: Diagnosis not present

## 2016-05-21 DIAGNOSIS — N2581 Secondary hyperparathyroidism of renal origin: Secondary | ICD-10-CM | POA: Diagnosis not present

## 2016-05-21 DIAGNOSIS — Z992 Dependence on renal dialysis: Secondary | ICD-10-CM | POA: Diagnosis not present

## 2016-05-21 DIAGNOSIS — N186 End stage renal disease: Secondary | ICD-10-CM | POA: Diagnosis not present

## 2016-05-23 ENCOUNTER — Other Ambulatory Visit: Payer: Self-pay

## 2016-05-23 ENCOUNTER — Ambulatory Visit: Payer: Medicare Other | Admitting: Gastroenterology

## 2016-05-23 DIAGNOSIS — N189 Chronic kidney disease, unspecified: Secondary | ICD-10-CM | POA: Insufficient documentation

## 2016-05-23 DIAGNOSIS — I259 Chronic ischemic heart disease, unspecified: Secondary | ICD-10-CM | POA: Insufficient documentation

## 2016-05-24 ENCOUNTER — Ambulatory Visit: Payer: Medicare Other | Admitting: Cardiology

## 2016-05-26 ENCOUNTER — Other Ambulatory Visit: Payer: Self-pay | Admitting: *Deleted

## 2016-05-26 DIAGNOSIS — N2581 Secondary hyperparathyroidism of renal origin: Secondary | ICD-10-CM | POA: Diagnosis not present

## 2016-05-26 DIAGNOSIS — D509 Iron deficiency anemia, unspecified: Secondary | ICD-10-CM | POA: Diagnosis not present

## 2016-05-26 DIAGNOSIS — D631 Anemia in chronic kidney disease: Secondary | ICD-10-CM | POA: Diagnosis not present

## 2016-05-26 DIAGNOSIS — N186 End stage renal disease: Secondary | ICD-10-CM | POA: Diagnosis not present

## 2016-05-26 DIAGNOSIS — R188 Other ascites: Secondary | ICD-10-CM

## 2016-05-26 DIAGNOSIS — Z992 Dependence on renal dialysis: Secondary | ICD-10-CM | POA: Diagnosis not present

## 2016-05-28 DIAGNOSIS — D631 Anemia in chronic kidney disease: Secondary | ICD-10-CM | POA: Diagnosis not present

## 2016-05-28 DIAGNOSIS — N186 End stage renal disease: Secondary | ICD-10-CM | POA: Diagnosis not present

## 2016-05-28 DIAGNOSIS — N2581 Secondary hyperparathyroidism of renal origin: Secondary | ICD-10-CM | POA: Diagnosis not present

## 2016-05-28 DIAGNOSIS — D509 Iron deficiency anemia, unspecified: Secondary | ICD-10-CM | POA: Diagnosis not present

## 2016-05-28 DIAGNOSIS — Z992 Dependence on renal dialysis: Secondary | ICD-10-CM | POA: Diagnosis not present

## 2016-05-30 ENCOUNTER — Emergency Department: Payer: Medicare Other

## 2016-05-30 ENCOUNTER — Inpatient Hospital Stay
Admission: EM | Admit: 2016-05-30 | Discharge: 2016-06-01 | DRG: 640 | Disposition: A | Discharge status: Home / Self Care | Payer: Medicare Other | Attending: Internal Medicine | Admitting: Internal Medicine

## 2016-05-30 DIAGNOSIS — D649 Anemia, unspecified: Secondary | ICD-10-CM

## 2016-05-30 DIAGNOSIS — I2510 Atherosclerotic heart disease of native coronary artery without angina pectoris: Secondary | ICD-10-CM | POA: Diagnosis present

## 2016-05-30 DIAGNOSIS — Z79899 Other long term (current) drug therapy: Secondary | ICD-10-CM

## 2016-05-30 DIAGNOSIS — F17210 Nicotine dependence, cigarettes, uncomplicated: Secondary | ICD-10-CM | POA: Diagnosis present

## 2016-05-30 DIAGNOSIS — K219 Gastro-esophageal reflux disease without esophagitis: Secondary | ICD-10-CM | POA: Diagnosis present

## 2016-05-30 DIAGNOSIS — N2581 Secondary hyperparathyroidism of renal origin: Secondary | ICD-10-CM | POA: Diagnosis present

## 2016-05-30 DIAGNOSIS — K7460 Unspecified cirrhosis of liver: Secondary | ICD-10-CM

## 2016-05-30 DIAGNOSIS — E1142 Type 2 diabetes mellitus with diabetic polyneuropathy: Secondary | ICD-10-CM | POA: Diagnosis not present

## 2016-05-30 DIAGNOSIS — E8770 Fluid overload, unspecified: Secondary | ICD-10-CM | POA: Diagnosis present

## 2016-05-30 DIAGNOSIS — R0602 Shortness of breath: Secondary | ICD-10-CM | POA: Diagnosis not present

## 2016-05-30 DIAGNOSIS — R188 Other ascites: Secondary | ICD-10-CM | POA: Diagnosis present

## 2016-05-30 DIAGNOSIS — E1151 Type 2 diabetes mellitus with diabetic peripheral angiopathy without gangrene: Secondary | ICD-10-CM | POA: Diagnosis present

## 2016-05-30 DIAGNOSIS — R109 Unspecified abdominal pain: Secondary | ICD-10-CM | POA: Diagnosis not present

## 2016-05-30 DIAGNOSIS — E1122 Type 2 diabetes mellitus with diabetic chronic kidney disease: Secondary | ICD-10-CM | POA: Diagnosis not present

## 2016-05-30 DIAGNOSIS — I120 Hypertensive chronic kidney disease with stage 5 chronic kidney disease or end stage renal disease: Secondary | ICD-10-CM | POA: Diagnosis not present

## 2016-05-30 DIAGNOSIS — Z951 Presence of aortocoronary bypass graft: Secondary | ICD-10-CM

## 2016-05-30 DIAGNOSIS — N186 End stage renal disease: Secondary | ICD-10-CM | POA: Diagnosis present

## 2016-05-30 DIAGNOSIS — E875 Hyperkalemia: Secondary | ICD-10-CM | POA: Diagnosis not present

## 2016-05-30 DIAGNOSIS — Z7951 Long term (current) use of inhaled steroids: Secondary | ICD-10-CM

## 2016-05-30 DIAGNOSIS — D631 Anemia in chronic kidney disease: Secondary | ICD-10-CM | POA: Diagnosis present

## 2016-05-30 DIAGNOSIS — I1311 Hypertensive heart and chronic kidney disease without heart failure, with stage 5 chronic kidney disease, or end stage renal disease: Secondary | ICD-10-CM | POA: Diagnosis present

## 2016-05-30 DIAGNOSIS — D638 Anemia in other chronic diseases classified elsewhere: Secondary | ICD-10-CM

## 2016-05-30 DIAGNOSIS — R410 Disorientation, unspecified: Secondary | ICD-10-CM | POA: Diagnosis present

## 2016-05-30 DIAGNOSIS — R140 Abdominal distension (gaseous): Secondary | ICD-10-CM

## 2016-05-30 DIAGNOSIS — E785 Hyperlipidemia, unspecified: Secondary | ICD-10-CM | POA: Diagnosis present

## 2016-05-30 DIAGNOSIS — Z9119 Patient's noncompliance with other medical treatment and regimen: Secondary | ICD-10-CM

## 2016-05-30 DIAGNOSIS — Z22322 Carrier or suspected carrier of Methicillin resistant Staphylococcus aureus: Secondary | ICD-10-CM

## 2016-05-30 DIAGNOSIS — Z992 Dependence on renal dialysis: Secondary | ICD-10-CM

## 2016-05-30 LAB — CBC WITH DIFFERENTIAL/PLATELET
Basophils Absolute: 0.1 10*3/uL (ref 0–0.1)
Basophils Relative: 1 %
EOS ABS: 0.2 10*3/uL (ref 0–0.7)
EOS PCT: 3 %
HCT: 25 % — ABNORMAL LOW (ref 40.0–52.0)
Hemoglobin: 8.1 g/dL — ABNORMAL LOW (ref 13.0–18.0)
LYMPHS ABS: 0.7 10*3/uL — AB (ref 1.0–3.6)
Lymphocytes Relative: 9 %
MCH: 28.5 pg (ref 26.0–34.0)
MCHC: 32.5 g/dL (ref 32.0–36.0)
MCV: 87.6 fL (ref 80.0–100.0)
Monocytes Absolute: 0.9 10*3/uL (ref 0.2–1.0)
Monocytes Relative: 11 %
Neutro Abs: 6.2 10*3/uL (ref 1.4–6.5)
Neutrophils Relative %: 76 %
PLATELETS: 263 10*3/uL (ref 150–440)
RBC: 2.85 MIL/uL — AB (ref 4.40–5.90)
RDW: 19.2 % — ABNORMAL HIGH (ref 11.5–14.5)
WBC: 8.1 10*3/uL (ref 3.8–10.6)

## 2016-05-30 LAB — PROTIME-INR
INR: 1.29
PROTHROMBIN TIME: 16.2 s — AB (ref 11.4–15.2)

## 2016-05-30 NOTE — Discharge Instructions (Signed)
Paracentesis, Care After  Refer to this sheet in the next few weeks. These instructions provide you with information about caring for yourself after your procedure. Your health care provider may also give you more specific instructions. Your treatment has been planned according to current medical practices, but problems sometimes occur. Call your health care provider if you have any problems or questions after your procedure.  What can I expect after the procedure?  After your procedure, it is common to have a small amount of clear fluid coming from the puncture site.  Follow these instructions at home:  · Return to your normal activities as told by your health care provider. Ask your health care provider what activities are safe for you.  · Take over-the-counter and prescription medicines only as told by your health care provider.  · Do not take baths, swim, or use a hot tub until your health care provider approves.  · Follow instructions from your health care provider about:  ? How to take care of your puncture site.  ? When and how you should change your bandage (dressing).  ? When you should remove your dressing.  · Check your puncture area every day signs of infection. Watch for:  ? Redness, swelling, or pain.  ? Fluid, blood, or pus.  · Keep all follow-up visits as told by your health care provider. This is important.  Contact a health care provider if:  · You have redness, swelling, or pain at your puncture site.  · You start to have more clear fluid coming from your puncture site.  · You have blood or pus coming from your puncture site.  · You have chills.  · You have a fever.  Get help right away if:  · You develop chest pain or shortness of breath.  · You develop increasing pain, discomfort, or swelling in your abdomen.  · You feel dizzy or light-headed or you pass out.  This information is not intended to replace advice given to you by your health care provider. Make sure you discuss any questions you  have with your health care provider.  Document Released: 07/15/2014 Document Revised: 08/06/2015 Document Reviewed: 05/13/2014  Elsevier Interactive Patient Education © 2017 [employer]

## 2016-05-30 NOTE — ED Triage Notes (Addendum)
Pt is scheduled for paracentesis Wednesday but states he cannot wait and wants it drawn now. Pt co shob, shob noted in triage. Pt was scheduled for dialysis Saturday but left without treatment.

## 2016-05-31 ENCOUNTER — Inpatient Hospital Stay: Payer: Medicare Other

## 2016-05-31 DIAGNOSIS — R109 Unspecified abdominal pain: Secondary | ICD-10-CM | POA: Diagnosis not present

## 2016-05-31 DIAGNOSIS — Z79899 Other long term (current) drug therapy: Secondary | ICD-10-CM | POA: Diagnosis not present

## 2016-05-31 DIAGNOSIS — N2581 Secondary hyperparathyroidism of renal origin: Secondary | ICD-10-CM | POA: Diagnosis not present

## 2016-05-31 DIAGNOSIS — I2510 Atherosclerotic heart disease of native coronary artery without angina pectoris: Secondary | ICD-10-CM | POA: Diagnosis present

## 2016-05-31 DIAGNOSIS — E875 Hyperkalemia: Secondary | ICD-10-CM | POA: Diagnosis present

## 2016-05-31 DIAGNOSIS — I1311 Hypertensive heart and chronic kidney disease without heart failure, with stage 5 chronic kidney disease, or end stage renal disease: Secondary | ICD-10-CM | POA: Diagnosis present

## 2016-05-31 DIAGNOSIS — K7460 Unspecified cirrhosis of liver: Secondary | ICD-10-CM | POA: Diagnosis present

## 2016-05-31 DIAGNOSIS — Z9119 Patient's noncompliance with other medical treatment and regimen: Secondary | ICD-10-CM | POA: Diagnosis not present

## 2016-05-31 DIAGNOSIS — I120 Hypertensive chronic kidney disease with stage 5 chronic kidney disease or end stage renal disease: Secondary | ICD-10-CM | POA: Diagnosis not present

## 2016-05-31 DIAGNOSIS — E1142 Type 2 diabetes mellitus with diabetic polyneuropathy: Secondary | ICD-10-CM | POA: Diagnosis present

## 2016-05-31 DIAGNOSIS — Z22322 Carrier or suspected carrier of Methicillin resistant Staphylococcus aureus: Secondary | ICD-10-CM | POA: Diagnosis not present

## 2016-05-31 DIAGNOSIS — K219 Gastro-esophageal reflux disease without esophagitis: Secondary | ICD-10-CM | POA: Diagnosis present

## 2016-05-31 DIAGNOSIS — E785 Hyperlipidemia, unspecified: Secondary | ICD-10-CM | POA: Diagnosis present

## 2016-05-31 DIAGNOSIS — E1151 Type 2 diabetes mellitus with diabetic peripheral angiopathy without gangrene: Secondary | ICD-10-CM | POA: Diagnosis present

## 2016-05-31 DIAGNOSIS — R188 Other ascites: Secondary | ICD-10-CM | POA: Diagnosis not present

## 2016-05-31 DIAGNOSIS — R410 Disorientation, unspecified: Secondary | ICD-10-CM | POA: Diagnosis not present

## 2016-05-31 DIAGNOSIS — R0600 Dyspnea, unspecified: Secondary | ICD-10-CM | POA: Diagnosis not present

## 2016-05-31 DIAGNOSIS — F17210 Nicotine dependence, cigarettes, uncomplicated: Secondary | ICD-10-CM | POA: Diagnosis present

## 2016-05-31 DIAGNOSIS — Z992 Dependence on renal dialysis: Secondary | ICD-10-CM | POA: Diagnosis not present

## 2016-05-31 DIAGNOSIS — Z7951 Long term (current) use of inhaled steroids: Secondary | ICD-10-CM | POA: Diagnosis not present

## 2016-05-31 DIAGNOSIS — N186 End stage renal disease: Secondary | ICD-10-CM | POA: Diagnosis not present

## 2016-05-31 DIAGNOSIS — E8770 Fluid overload, unspecified: Secondary | ICD-10-CM | POA: Diagnosis present

## 2016-05-31 DIAGNOSIS — Z951 Presence of aortocoronary bypass graft: Secondary | ICD-10-CM | POA: Diagnosis not present

## 2016-05-31 DIAGNOSIS — D649 Anemia, unspecified: Secondary | ICD-10-CM | POA: Diagnosis not present

## 2016-05-31 DIAGNOSIS — E1122 Type 2 diabetes mellitus with diabetic chronic kidney disease: Secondary | ICD-10-CM | POA: Diagnosis present

## 2016-05-31 DIAGNOSIS — D631 Anemia in chronic kidney disease: Secondary | ICD-10-CM | POA: Diagnosis not present

## 2016-05-31 LAB — CBC
HCT: 23.1 % — ABNORMAL LOW (ref 40.0–52.0)
Hemoglobin: 7.3 g/dL — ABNORMAL LOW (ref 13.0–18.0)
MCH: 27.5 pg (ref 26.0–34.0)
MCHC: 31.8 g/dL — ABNORMAL LOW (ref 32.0–36.0)
MCV: 86.7 fL (ref 80.0–100.0)
PLATELETS: 250 10*3/uL (ref 150–440)
RBC: 2.67 MIL/uL — AB (ref 4.40–5.90)
RDW: 19.1 % — AB (ref 11.5–14.5)
WBC: 7.4 10*3/uL (ref 3.8–10.6)

## 2016-05-31 LAB — COMPREHENSIVE METABOLIC PANEL
ALK PHOS: 164 U/L — AB (ref 38–126)
ALT: 36 U/L (ref 17–63)
ANION GAP: 13 (ref 5–15)
AST: 30 U/L (ref 15–41)
Albumin: 3.2 g/dL — ABNORMAL LOW (ref 3.5–5.0)
BUN: 82 mg/dL — AB (ref 6–20)
CO2: 19 mmol/L — AB (ref 22–32)
Calcium: 12.7 mg/dL — ABNORMAL HIGH (ref 8.9–10.3)
Chloride: 106 mmol/L (ref 101–111)
Creatinine, Ser: 12.7 mg/dL — ABNORMAL HIGH (ref 0.61–1.24)
GFR calc Af Amer: 4 mL/min — ABNORMAL LOW (ref >60)
GFR calc non Af Amer: 4 mL/min — ABNORMAL LOW (ref >60)
GLUCOSE: 96 mg/dL (ref 65–99)
POTASSIUM: 7.2 mmol/L — AB (ref 3.5–5.1)
SODIUM: 138 mmol/L (ref 135–145)
Total Bilirubin: 0.9 mg/dL (ref 0.3–1.2)
Total Protein: 7.5 g/dL (ref 6.5–8.1)

## 2016-05-31 LAB — TYPE AND SCREEN
ABO/RH(D): A POS
Antibody Screen: NEGATIVE

## 2016-05-31 LAB — RENAL FUNCTION PANEL
ALBUMIN: 3 g/dL — AB (ref 3.5–5.0)
Anion gap: 13 (ref 5–15)
BUN: 68 mg/dL — AB (ref 6–20)
CHLORIDE: 105 mmol/L (ref 101–111)
CO2: 20 mmol/L — ABNORMAL LOW (ref 22–32)
CREATININE: 10.46 mg/dL — AB (ref 0.61–1.24)
Calcium: 11.7 mg/dL — ABNORMAL HIGH (ref 8.9–10.3)
GFR, EST AFRICAN AMERICAN: 6 mL/min — AB (ref >60)
GFR, EST NON AFRICAN AMERICAN: 5 mL/min — AB (ref >60)
Glucose, Bld: 143 mg/dL — ABNORMAL HIGH (ref 65–99)
PHOSPHORUS: 6.1 mg/dL — AB (ref 2.5–4.6)
Potassium: 5.5 mmol/L — ABNORMAL HIGH (ref 3.5–5.1)
Sodium: 138 mmol/L (ref 135–145)

## 2016-05-31 LAB — AMMONIA: Ammonia: 37 umol/L — ABNORMAL HIGH (ref 9–35)

## 2016-05-31 LAB — BODY FLUID CELL COUNT WITH DIFFERENTIAL
EOS FL: 0 %
Lymphs, Fluid: 19 %
Monocyte-Macrophage-Serous Fluid: 70 %
NEUTROPHIL FLUID: 11 %
OTHER CELLS FL: 0 %
Total Nucleated Cell Count, Fluid: 200 cu mm

## 2016-05-31 LAB — MRSA PCR SCREENING: MRSA by PCR: POSITIVE — AB

## 2016-05-31 LAB — ALBUMIN, PLEURAL OR PERITONEAL FLUID: Albumin, Fluid: 2 g/dL

## 2016-05-31 LAB — LACTATE DEHYDROGENASE, PLEURAL OR PERITONEAL FLUID: LD FL: 79 U/L — AB (ref 3–23)

## 2016-05-31 LAB — GLUCOSE, CAPILLARY
GLUCOSE-CAPILLARY: 70 mg/dL (ref 65–99)
Glucose-Capillary: 141 mg/dL — ABNORMAL HIGH (ref 65–99)
Glucose-Capillary: 48 mg/dL — ABNORMAL LOW (ref 65–99)
Glucose-Capillary: 66 mg/dL (ref 65–99)
Glucose-Capillary: 71 mg/dL (ref 65–99)

## 2016-05-31 LAB — PROTEIN, PLEURAL OR PERITONEAL FLUID: Total protein, fluid: 4.2 g/dL

## 2016-05-31 LAB — ETHANOL

## 2016-05-31 LAB — POTASSIUM: POTASSIUM: 6.3 mmol/L — AB (ref 3.5–5.1)

## 2016-05-31 LAB — BRAIN NATRIURETIC PEPTIDE: B NATRIURETIC PEPTIDE 5: 4018 pg/mL — AB (ref 0.0–100.0)

## 2016-05-31 LAB — LIPASE, BLOOD: Lipase: 37 U/L (ref 11–51)

## 2016-05-31 MED ORDER — PANTOPRAZOLE SODIUM 40 MG PO TBEC
40.0000 mg | DELAYED_RELEASE_TABLET | Freq: Every day | ORAL | Status: DC
  Administered 2016-05-31 – 2016-06-01 (×2): 40 mg via ORAL
  Filled 2016-05-31 (×2): qty 1

## 2016-05-31 MED ORDER — SODIUM CHLORIDE 0.9% FLUSH
3.0000 mL | Freq: Two times a day (BID) | INTRAVENOUS | Status: DC
  Administered 2016-05-31 – 2016-06-01 (×2): 3 mL via INTRAVENOUS

## 2016-05-31 MED ORDER — SODIUM POLYSTYRENE SULFONATE 15 GM/60ML PO SUSP
30.0000 g | Freq: Once | ORAL | Status: AC
  Administered 2016-05-31: 30 g via ORAL
  Filled 2016-05-31: qty 60

## 2016-05-31 MED ORDER — ONDANSETRON HCL 4 MG/2ML IJ SOLN: 4.0000 mg | Freq: Four times a day (QID) | INTRAMUSCULAR | Status: DC | PRN

## 2016-05-31 MED ORDER — ADULT MULTIVITAMIN W/MINERALS CH
1.0000 | ORAL_TABLET | Freq: Every day | ORAL | Status: DC
  Administered 2016-05-31 – 2016-06-01 (×2): 1 via ORAL
  Filled 2016-05-31 (×2): qty 1

## 2016-05-31 MED ORDER — DEXTROSE 50 % IV SOLN
INTRAVENOUS | Status: AC
  Administered 2016-05-31: 50 mL via INTRAVENOUS
  Filled 2016-05-31: qty 50

## 2016-05-31 MED ORDER — HEPARIN SODIUM (PORCINE) 5000 UNIT/ML IJ SOLN
5000.0000 [IU] | Freq: Three times a day (TID) | INTRAMUSCULAR | Status: DC
  Administered 2016-05-31: 5000 [IU] via SUBCUTANEOUS
  Filled 2016-05-31: qty 1

## 2016-05-31 MED ORDER — RENA-VITE PO TABS
1.0000 | ORAL_TABLET | Freq: Every day | ORAL | Status: DC
  Administered 2016-05-31: 1 via ORAL
  Filled 2016-05-31: qty 1

## 2016-05-31 MED ORDER — DEXTROSE 50 % IV SOLN
1.0000 | Freq: Once | INTRAVENOUS | Status: AC
  Administered 2016-05-31: 50 mL via INTRAVENOUS

## 2016-05-31 MED ORDER — FUROSEMIDE 10 MG/ML IJ SOLN
40.0000 mg | Freq: Two times a day (BID) | INTRAMUSCULAR | Status: DC
  Administered 2016-05-31 – 2016-06-01 (×3): 40 mg via INTRAVENOUS
  Filled 2016-05-31 (×3): qty 4

## 2016-05-31 MED ORDER — SEVELAMER CARBONATE 800 MG PO TABS
2400.0000 mg | ORAL_TABLET | Freq: Three times a day (TID) | ORAL | Status: DC
  Administered 2016-05-31 – 2016-06-01 (×3): 2400 mg via ORAL
  Filled 2016-05-31 (×3): qty 3

## 2016-05-31 MED ORDER — MOMETASONE FURO-FORMOTEROL FUM 200-5 MCG/ACT IN AERO
2.0000 | INHALATION_SPRAY | Freq: Two times a day (BID) | RESPIRATORY_TRACT | Status: DC
  Administered 2016-05-31 (×2): 2 via RESPIRATORY_TRACT
  Filled 2016-05-31: qty 8.8

## 2016-05-31 MED ORDER — CALCIUM ACETATE (PHOS BINDER) 667 MG PO CAPS
2001.0000 mg | ORAL_CAPSULE | Freq: Three times a day (TID) | ORAL | Status: DC
  Administered 2016-05-31: 2001 mg via ORAL
  Filled 2016-05-31: qty 3

## 2016-05-31 MED ORDER — EPOETIN ALFA 10000 UNIT/ML IJ SOLN
10000.0000 [IU] | INTRAMUSCULAR | Status: DC
  Administered 2016-05-31: 10000 [IU] via INTRAVENOUS
  Filled 2016-05-31: qty 1

## 2016-05-31 MED ORDER — DEXTROSE 50 % IV SOLN
1.0000 | Freq: Once | INTRAVENOUS | Status: AC
Start: 2016-05-31 — End: 2016-05-31
  Administered 2016-05-31: 50 mL via INTRAVENOUS

## 2016-05-31 MED ORDER — ATORVASTATIN CALCIUM 20 MG PO TABS
40.0000 mg | ORAL_TABLET | Freq: Every day | ORAL | Status: DC
  Administered 2016-05-31 – 2016-06-01 (×2): 40 mg via ORAL
  Filled 2016-05-31 (×2): qty 2

## 2016-05-31 MED ORDER — SODIUM POLYSTYRENE SULFONATE 15 GM/60ML PO SUSP
ORAL | Status: AC
  Administered 2016-05-31: 30 g via ORAL
  Filled 2016-05-31: qty 60

## 2016-05-31 MED ORDER — NITROGLYCERIN 0.4 MG SL SUBL: 0.4000 mg | SUBLINGUAL_TABLET | SUBLINGUAL | Status: DC | PRN

## 2016-05-31 MED ORDER — SODIUM CHLORIDE 0.9 % IV SOLN
1.0000 g | Freq: Once | INTRAVENOUS | Status: AC
  Administered 2016-05-31: 1 g via INTRAVENOUS
  Filled 2016-05-31: qty 10

## 2016-05-31 MED ORDER — LABETALOL HCL 200 MG PO TABS
100.0000 mg | ORAL_TABLET | Freq: Every day | ORAL | Status: DC
  Administered 2016-05-31 – 2016-06-01 (×2): 100 mg via ORAL
  Filled 2016-05-31 (×2): qty 1

## 2016-05-31 MED ORDER — CINACALCET HCL 30 MG PO TABS: 30.0000 mg | ORAL_TABLET | Freq: Every day | ORAL | Status: DC

## 2016-05-31 MED ORDER — INSULIN ASPART 100 UNIT/ML ~~LOC~~ SOLN
8.0000 [IU] | Freq: Once | SUBCUTANEOUS | Status: AC
  Administered 2016-05-31: 8 [IU] via SUBCUTANEOUS
  Filled 2016-05-31: qty 8

## 2016-05-31 MED ORDER — SENNOSIDES-DOCUSATE SODIUM 8.6-50 MG PO TABS: 1.0000 | ORAL_TABLET | Freq: Every evening | ORAL | Status: DC | PRN

## 2016-05-31 MED ORDER — AMLODIPINE BESYLATE 5 MG PO TABS
5.0000 mg | ORAL_TABLET | Freq: Every day | ORAL | Status: DC
  Administered 2016-05-31 – 2016-06-01 (×2): 5 mg via ORAL
  Filled 2016-05-31 (×2): qty 1

## 2016-05-31 MED ORDER — SODIUM CHLORIDE 0.9% FLUSH: 3.0000 mL | INTRAVENOUS | Status: DC | PRN

## 2016-05-31 MED ORDER — ACETAMINOPHEN 325 MG PO TABS: 650.0000 mg | ORAL_TABLET | Freq: Four times a day (QID) | ORAL | Status: DC | PRN

## 2016-05-31 MED ORDER — ONDANSETRON HCL 4 MG PO TABS: 4.0000 mg | ORAL_TABLET | Freq: Four times a day (QID) | ORAL | Status: DC | PRN

## 2016-05-31 MED ORDER — ACETAMINOPHEN 650 MG RE SUPP: 650.0000 mg | Freq: Four times a day (QID) | RECTAL | Status: DC | PRN

## 2016-05-31 MED ORDER — SODIUM CHLORIDE 0.9 % IV SOLN: 250.0000 mL | INTRAVENOUS | Status: DC | PRN

## 2016-05-31 MED ORDER — OXYCODONE-ACETAMINOPHEN 5-325 MG PO TABS
1.0000 | ORAL_TABLET | ORAL | Status: DC | PRN
  Administered 2016-06-01: 1 via ORAL
  Filled 2016-05-31: qty 1

## 2016-05-31 MED ORDER — GABAPENTIN 300 MG PO CAPS
300.0000 mg | ORAL_CAPSULE | Freq: Every day | ORAL | Status: DC
  Administered 2016-05-31 – 2016-06-01 (×2): 300 mg via ORAL
  Filled 2016-05-31 (×2): qty 1

## 2016-05-31 MED ORDER — SODIUM CHLORIDE 0.9% FLUSH
3.0000 mL | Freq: Two times a day (BID) | INTRAVENOUS | Status: DC
  Administered 2016-05-31 – 2016-06-01 (×3): 3 mL via INTRAVENOUS

## 2016-05-31 NOTE — Progress Notes (Signed)
Pre hd 

## 2016-05-31 NOTE — ED Provider Notes (Signed)
[facility] Emergency Department Provider Note   ____________________________________________   First MD Initiated Contact with Patient 05/30/16 2322     (approximate)  I have reviewed the triage vital signs and the nursing notes.   HISTORY  Chief Complaint Abdominal Pain    HPI Stanley [name] is a 56 y.o. male brought to the ED from home via EMS with a chief complain of shortness of breath and desiring paracentesis. Patient has a history of cirrhosis, ESRD on hemodialysis who states he is scheduled for paracentesis in 2 days but he cannot wait. Also admits he missed his last 2 dialysis appointments. Complains of abdominal distention and discomfort, shortness of breath exacerbated by exertion and supine position. Denies recent fever, chills, chest pain, nausea, vomiting, diarrhea. Denies recent travel or trauma.   Past Medical History:  Diagnosis Date  . Alcohol abuse   . Cirrhosis ([place])   . Coronary artery disease 2009  . Diabetic peripheral neuropathy ([place])   . Drug abuse   . End stage renal disease on dialysis [facility]) NEPHROLOGIST-   [facility]  IN [place]   HEMODIALYSIS --   TUES/  THURS/  SAT  . GERD (gastroesophageal reflux disease)   . Hyperlipidemia   . Hypertension   . PAD (peripheral artery disease) ([place])   . Suicidal ideation    & HOMICIDAL IDEATION --  06-16-2013   ADMITTED TO BEHAVIOR HEALTH    Patient Active Problem List   Diagnosis Date Noted  . Chronic renal failure 05/23/2016  . Ischemic heart disease 05/23/2016  . Angiodysplasia of small intestine   . Melena   . Small bowel bleed not requiring more than 4 units of blood in 24 hours, ICU, or surgery   . Anemia due to chronic blood loss   . Abdominal pain 05/05/2016  . Acute posthemorrhagic anemia 04/17/2016  . Gastrointestinal bleed 04/17/2016  . History of esophagogastroduodenoscopy (EGD) 04/17/2016  . Elevated troponin 04/17/2016  . Alcohol abuse 04/17/2016  . Upper  GI bleed 01/19/2016  . Blood in stool   . Angiodysplasia of stomach and duodenum with hemorrhage   . Gastritis   . Esophagitis, unspecified   . GI bleed 05/16/2015  . Acute GI bleeding   . Symptomatic anemia 04/30/2015  . HTN (hypertension) 04/06/2015  . GERD (gastroesophageal reflux disease) 04/06/2015  . HLD (hyperlipidemia) 04/06/2015  . Dyspnea 04/06/2015  . Cirrhosis of liver with ascites ([place]) 04/06/2015  . Ascites 04/06/2015  . GIB (gastrointestinal bleeding) 03/23/2015  . Homicidal ideation 06/19/2013  . Suicidal intent 06/19/2013  . Homicidal ideations 06/19/2013  . Hyperkalemia 06/16/2013  . Mandible fracture ([place]) 06/05/2013  . Fracture, mandible ([place]) 06/02/2013  . Coronary atherosclerosis of native coronary artery 06/02/2013  . ESRD on dialysis ([place]) 06/02/2013  . Mandible open fracture ([place]) 06/02/2013    Past Surgical History:  Procedure Laterality Date  . CORONARY ANGIOPLASTY  ?   PT UNABLE TO TELL IF  BEFORE OR AFTER  CABG  . CORONARY ARTERY BYPASS GRAFT  2008  ([place] , [place])   3 VESSEL  . DIALYSIS FISTULA CREATION  LAST SURGERY  APPOX  2008  . ENTEROSCOPY N/A 05/10/2016   Procedure: ENTEROSCOPY;  Surgeon: Jerene Bears, MD;  Location: [place];  Service: Gastroenterology;  Laterality: N/A;  . ESOPHAGOGASTRODUODENOSCOPY N/A 05/07/2015   Procedure: ESOPHAGOGASTRODUODENOSCOPY (EGD);  Surgeon: Hulen Luster, MD;  Location: [facility] ENDOSCOPY;  Service: Endoscopy;  Laterality: N/A;  . ESOPHAGOGASTRODUODENOSCOPY (EGD) WITH PROPOFOL N/A 05/17/2015  Procedure: ESOPHAGOGASTRODUODENOSCOPY (EGD) WITH PROPOFOL;  Surgeon: [name], MD;  Location: [facility] ENDOSCOPY;  Service: Endoscopy;  Laterality: N/A;  . ESOPHAGOGASTRODUODENOSCOPY (EGD) WITH PROPOFOL N/A 01/20/2016   Procedure: ESOPHAGOGASTRODUODENOSCOPY (EGD) WITH PROPOFOL;  Surgeon: [name], MD;  Location: [facility] ENDOSCOPY;  Service: Endoscopy;  Laterality: N/A;  . ESOPHAGOGASTRODUODENOSCOPY (EGD) WITH PROPOFOL N/A 04/17/2016    Procedure: ESOPHAGOGASTRODUODENOSCOPY (EGD) WITH PROPOFOL;  Surgeon: Lin Landsman, MD;  Location: [facility] ENDOSCOPY;  Service: Gastroenterology;  Laterality: N/A;  . ESOPHAGOGASTRODUODENOSCOPY (EGD) WITH PROPOFOL  05/09/2016   Procedure: ESOPHAGOGASTRODUODENOSCOPY (EGD) WITH PROPOFOL;  Surgeon: Jerene Bears, MD;  Location: [place];  Service: Endoscopy;;  . GIVENS CAPSULE STUDY N/A 05/07/2016   Procedure: GIVENS CAPSULE STUDY;  Surgeon: Doran Stabler, MD;  Location: [place];  Service: Endoscopy;  Laterality: N/A;  . MANDIBULAR HARDWARE REMOVAL N/A 07/29/2013   Procedure: REMOVAL OF ARCH BARS;  Surgeon: Theodoro Kos, DO;  Location: [place];  Service: Plastics;  Laterality: N/A;  . ORIF MANDIBULAR FRACTURE N/A 06/05/2013   Procedure: REPAIR OF MANDIBULAR FRACTURE x 2 with maxillo-mandibular fixation ;  Surgeon: Theodoro Kos, DO;  Location: [place];  Service: Plastics;  Laterality: N/A;  . PERIPHERAL ARTERIAL STENT GRAFT Left     Prior to Admission medications   Medication Sig Start Date End Date Taking? Authorizing Provider  amLODipine (NORVASC) 5 MG tablet Take 5 mg by mouth daily.    Historical Provider, MD  atorvastatin (LIPITOR) 40 MG tablet Take 40 mg by mouth daily. 04/19/16   Historical Provider, MD  b complex-vitamin c-folic acid (NEPHRO-VITE) 0.8 MG TABS tablet Take 1 tablet by mouth daily.    Historical Provider, MD  budesonide-formoterol (SYMBICORT) 160-4.5 MCG/ACT inhaler Inhale 2 puffs into the lungs daily.    Historical Provider, MD  calcium acetate (PHOSLO) 667 MG capsule Take 2,001 mg by mouth 3 (three) times daily before meals.     Historical Provider, MD  cinacalcet (SENSIPAR) 30 MG tablet Take 30 mg by mouth daily with supper.    Historical Provider, MD  fluconazole (DIFLUCAN) 100 MG tablet Take 1 tablet (100 mg total) by mouth daily. 05/11/16   Shanker Kristeen Mans, MD  furosemide (LASIX) 80 MG tablet Take 80 mg by mouth daily. 05/11/16   Historical  Provider, MD  gabapentin (NEURONTIN) 300 MG capsule Take 1 capsule (300 mg total) by mouth daily. Patient taking differently: Take 300 mg by mouth at bedtime.  03/27/15   Bettey Costa, MD  hydrOXYzine (VISTARIL) 25 MG capsule Take 1 capsule (25 mg total) by mouth 3 (three) times daily as needed for itching. Patient not taking: Reported on 05/06/2016 02/25/16   Jami L Hagler, PA-C  labetalol (NORMODYNE) 100 MG tablet Take 1 tablet (100 mg total) by mouth daily. 01/22/16   Epifanio Lesches, MD  Multiple Vitamin (MULTIVITAMIN WITH MINERALS) TABS tablet Take 1 tablet by mouth daily. Men's One a Day    Historical Provider, MD  naproxen (NAPROSYN) 500 MG tablet Take 500 mg by mouth 2 (two) times daily as needed. for pain 04/28/16   Historical Provider, MD  nitroGLYCERIN (NITROSTAT) 0.4 MG SL tablet Place 1 tablet (0.4 mg total) under the tongue every 5 (five) minutes as needed. Patient taking differently: Place 0.4 mg under the tongue every 5 (five) minutes as needed for chest pain.  04/12/16   Wende Bushy, MD  oxyCODONE-acetaminophen (ROXICET) 5-325 MG tablet Take 1-2 tablets by mouth every 4 (four) hours as needed for severe pain. 04/21/16  Hinda Kehr, MD  pantoprazole (PROTONIX) 40 MG tablet Take 1 tablet (40 mg total) by mouth daily. 05/10/16   Shanker Kristeen Mans, MD    Allergies Patient has no known allergies.  Family History  Problem Relation Age of Onset  . Colon cancer Mother   . Cancer Father   . Cancer Sister     Social History Social History  Substance Use Topics  . Smoking status: Current Every Day Smoker    Packs/day: 0.15    Years: 40.00    Types: Cigarettes  . Smokeless tobacco: Never Used  . Alcohol use No     Comment: pt reports quitting after learning about cirrhosis    Review of Systems  Constitutional: No fever/chills. Eyes: No visual changes. ENT: No sore throat. Cardiovascular: Denies chest pain. Respiratory: Positive for shortness of breath. Gastrointestinal:  Positive for abdominal distention and discomfort. No abdominal pain.  No nausea, no vomiting.  No diarrhea.  No constipation. Genitourinary: Negative for dysuria. Musculoskeletal: Negative for back pain. Skin: Negative for rash. Neurological: Negative for headaches, focal weakness or numbness.  10-point ROS otherwise negative.  ____________________________________________   PHYSICAL EXAM:  VITAL SIGNS: ED Triage Vitals  Enc Vitals Group     BP 05/30/16 2211 (!) 145/80     Pulse Rate 05/30/16 2211 78     Resp 05/30/16 2211 18     Temp 05/30/16 2211 98.5 F (36.9 C)     Temp Source 05/30/16 2211 Oral     SpO2 05/30/16 2211 96 %     Weight 05/30/16 2211 190 lb (86.2 kg)     Height --      Head Circumference --      Peak Flow --      Pain Score 05/30/16 2212 8     Pain Loc --      Pain Edu? --      Excl. in [place]? --     Constitutional: Alert and oriented. Well appearing and in mild acute distress. Eyes: Conjunctivae are normal. PERRL. EOMI. Head: Atraumatic. Nose: No congestion/rhinnorhea. Mouth/Throat: Mucous membranes are moist.  Oropharynx non-erythematous. Neck: No stridor.   Cardiovascular: Normal rate, regular rhythm. Grossly normal heart sounds.  Good peripheral circulation. Respiratory: Increased respiratory effort.  No retractions. Lungs with faint bibasilar rales. Gastrointestinal: Soft and nontender. Moderate  distention. Hepatomegaly. No abdominal bruits. No CVA tenderness. Musculoskeletal: No lower extremity tenderness nor edema.  No joint effusions. Neurologic:  Normal speech and language. No gross focal neurologic deficits are appreciated. No gait instability. Skin:  Skin is warm, dry and intact. No rash noted. Psychiatric: Mood and affect are normal. Speech and behavior are normal.  ____________________________________________   LABS (all labs ordered are listed, but only abnormal results are displayed)  Labs Reviewed  CBC WITH DIFFERENTIAL/PLATELET -  Abnormal; Notable for the following:       Result Value   RBC 2.85 (*)    Hemoglobin 8.1 (*)    HCT 25.0 (*)    RDW 19.2 (*)    Lymphs Abs 0.7 (*)    All other components within normal limits  COMPREHENSIVE METABOLIC PANEL - Abnormal; Notable for the following:    Potassium 7.2 (*)    CO2 19 (*)    BUN 82 (*)    Creatinine, Ser 12.70 (*)    Calcium 12.7 (*)    Albumin 3.2 (*)    Alkaline Phosphatase 164 (*)    GFR calc non Af Amer 4 (*)  GFR calc Af Amer 4 (*)    All other components within normal limits  PROTIME-INR - Abnormal; Notable for the following:    Prothrombin Time 16.2 (*)    All other components within normal limits  AMMONIA - Abnormal; Notable for the following:    Ammonia 37 (*)    All other components within normal limits  BRAIN NATRIURETIC PEPTIDE - Abnormal; Notable for the following:    B Natriuretic Peptide 4,018.0 (*)    All other components within normal limits  LIPASE, BLOOD  ETHANOL  TYPE AND SCREEN   ____________________________________________  EKG  ED ECG REPORT I, Ketzia Guzek J, the attending physician, personally viewed and interpreted this ECG.   Date: 05/31/2016  EKG Time: 2242  Rate: 76  Rhythm: normal EKG, normal sinus rhythm  Axis: Normal  Intervals:none  ST&T Change: T-wave inversions  ____________________________________________  RADIOLOGY   chest x-ray interpreted per Dr. Radene Knee: Vascular congestion and mild cardiomegaly. Increased interstitial  markings raise concern for mild interstitial edema.    ____________________________________________   PROCEDURES  Procedure(s) performed: None  Procedures  Critical Care performed: Yes, see critical care note(s)  CRITICAL CARE Performed by: Paulette Blanch   Total critical care time: 30 minutes  Critical care time was exclusive of separately billable procedures and treating other patients.  Critical care was necessary to treat or prevent imminent or life-threatening  deterioration.  Critical care was time spent personally by me on the following activities: development of treatment plan with patient and/or surrogate as well as nursing, discussions with consultants, evaluation of patient's response to treatment, examination of patient, obtaining history from patient or surrogate, ordering and performing treatments and interventions, ordering and review of laboratory studies, ordering and review of radiographic studies, pulse oximetry and re-evaluation of patient's condition. ____________________________________________   INITIAL IMPRESSION / ASSESSMENT AND PLAN / ED COURSE  Pertinent labs & imaging results that were available during my care of the patient were reviewed by me and considered in my medical decision making (see chart for details).  56 year old male with renal disease on hemodialysis, cirrhosis who presents with abdominal swelling secondary to ascites and shortness of breath. We will obtain screening lab work including INR and reassess.  Clinical Course as of May 31 116  Tue May 31, 2016  0117 Updated patient of laboratory and imaging results. Hyperkalemia noted without EKG changes. Hyperkalemia treatment initiated. Discussed with hospitalist to evaluate patient in the emergency department for admission.  [JS]    Clinical Course User Index [JS] Paulette Blanch, MD     ____________________________________________   FINAL CLINICAL IMPRESSION(S) / ED DIAGNOSES  Final diagnoses:  Hyperkalemia  Stage 5 chronic kidney disease on chronic dialysis ([place])  Cirrhosis of liver with ascites, unspecified hepatic cirrhosis type ([place])  SOB (shortness of breath)  Anemia, unspecified type      NEW MEDICATIONS STARTED DURING THIS VISIT:  New Prescriptions   No medications on file     Note:  This document was prepared using Dragon voice recognition software and may include unintentional dictation errors.    Paulette Blanch, MD 05/31/16 [phone]

## 2016-05-31 NOTE — Progress Notes (Signed)
[facility] [place] NAME: Stanley [name]    MR#:  [id number]  DATE OF BIRTH:  1960/03/26  SUBJECTIVE:  CHIEF COMPLAINT:   Chief Complaint  Patient presents with  . Abdominal Pain   The patient is a 56 year old [place] American male with past medical history significant for history of end-stage renal disease, on dialysis, liver cirrhosis, diabetic neuropathy, substance abuse, I'll call abuse, hyperlipidemia, hypertension, cardiovascular disease, who presented to the hospital with complaints of shortness of breath, dyspnea on exertion. Recent abdominal ultrasound revealed ascites. Patient complained of some abdominal distention, shortness of breath, but no chest pains. On arrival to emergency room, patient's potassium level was elevated at 7.2. Nephrology was consulted and patient is undergoing hemodialysis today. He denies any significant pain at present,  except of abdominal discomfort due to distention  Review of Systems  Constitutional: Negative for chills, fever and weight loss.  HENT: Negative for congestion.   Eyes: Negative for blurred vision and double vision.  Respiratory: Positive for shortness of breath. Negative for cough, sputum production and wheezing.   Cardiovascular: Negative for chest pain, palpitations, orthopnea, leg swelling and PND.  Gastrointestinal: Negative for abdominal pain, blood in stool, constipation, diarrhea, nausea and vomiting.  Genitourinary: Negative for dysuria, frequency, hematuria and urgency.  Musculoskeletal: Negative for falls.  Neurological: Negative for dizziness, tremors, focal weakness and headaches.  Endo/Heme/Allergies: Does not bruise/bleed easily.  Psychiatric/Behavioral: Negative for depression. The patient does not have insomnia.     VITAL SIGNS: Blood pressure 122/72, pulse 70, temperature 98 F (36.7 C), temperature source Oral, resp. rate (!) 22, height 6\' 3"  (1.905 m), weight  84.5 kg (186 lb 4.6 oz), SpO2 92 %.  PHYSICAL EXAMINATION:   GENERAL:  56 y.o.-year-old patient lying in the bed in mild respiratory distress, tachypneic, uncomfortable intermittently, cannot take deep breath due to abdominal distention.  EYES: Pupils equal, round, reactive to light and accommodation. No scleral icterus. Extraocular muscles intact.  HEENT: Head atraumatic, normocephalic. Oropharynx and nasopharynx clear.  NECK:  Supple, no jugular venous distention. No thyroid enlargement, no tenderness.  LUNGS: Diminished breath sounds bilaterally, no wheezing, rales,rhonchi , few basilar crepitations . Using accessory muscles of respiration, intermittently tachypneic, with exertion and speech.  CARDIOVASCULAR: S1, S2 normal. No murmurs, rubs, or gallops.  ABDOMEN:  nontender, distended, moderately firm. Bowel sounds present. No organomegaly or mass.  EXTREMITIES: No pedal edema, cyanosis, or clubbing.  NEUROLOGIC: Cranial nerves II through XII are intact. Muscle strength 5/5 in all extremities. Sensation intact. Gait not checked.  PSYCHIATRIC: The patient is alert and oriented x 3.  SKIN: No obvious rash, lesion, or ulcer.   ORDERS/RESULTS REVIEWED:   CBC  Recent Labs Lab 05/30/16 2328 05/31/16 1042  WBC 8.1 7.4  HGB 8.1* 7.3*  HCT 25.0* 23.1*  PLT 263 250  MCV 87.6 86.7  MCH 28.5 27.5  MCHC 32.5 31.8*  RDW 19.2* 19.1*  LYMPHSABS 0.7*  --   MONOABS 0.9  --   EOSABS 0.2  --   BASOSABS 0.1  --    ------------------------------------------------------------------------------------------------------------------  Chemistries   Recent Labs Lab 05/30/16 2328 05/31/16 0752 05/31/16 1042  NA 138  --  138  K 7.2* 6.3* 5.5*  CL 106  --  105  CO2 19*  --  20*  GLUCOSE 96  --  143*  BUN 82*  --  68*  CREATININE 12.70*  --  10.46*  CALCIUM 12.7*  --  11.7*  AST 30  --   --   ALT 36  --   --   ALKPHOS 164*  --   --   BILITOT 0.9  --   --     ------------------------------------------------------------------------------------------------------------------ estimated creatinine clearance is 9.3 mL/min (A) (by C-G formula based on SCr of 10.46 mg/dL (H)). ------------------------------------------------------------------------------------------------------------------ No results for input(s): TSH, T4TOTAL, T3FREE, THYROIDAB in the last 72 hours.  Invalid input(s): FREET3  Cardiac Enzymes No results for input(s): CKMB, TROPONINI, MYOGLOBIN in the last 168 hours.  Invalid input(s): CK ------------------------------------------------------------------------------------------------------------------ Invalid input(s): POCBNP ---------------------------------------------------------------------------------------------------------------  RADIOLOGY: Dg Chest 2 View  Result Date: 05/30/2016 CLINICAL DATA:  Subacute onset of shortness of breath. Initial encounter. EXAM: CHEST  2 VIEW COMPARISON:  Chest radiograph performed 04/21/2016 FINDINGS: The lungs are well-aerated. Vascular congestion is noted. Increased interstitial markings raise concern for mild interstitial edema. There is no evidence of pleural effusion or pneumothorax. The heart is mildly enlarged. The patient is status post median sternotomy. No acute osseous abnormalities are seen. IMPRESSION: Vascular congestion and mild cardiomegaly. Increased interstitial markings raise concern for mild interstitial edema. Electronically Signed   By: Garald Balding M.D.   On: 05/30/2016 23:04   US Paracentesis  Result Date: 05/31/2016 INDICATION: Cirrhosis and ascites. EXAM: ULTRASOUND GUIDED PARACENTESIS MEDICATIONS: None. COMPLICATIONS: None immediate. PROCEDURE: Informed written consent was obtained from the patient after a discussion of the risks, benefits and alternatives to treatment. A timeout was performed prior to the initiation of the procedure. Initial ultrasound was performed to  localize ascites. The right lower abdomen was prepped and draped in the usual sterile fashion. 1% lidocaine was used for local anesthesia. Following this, a 6 Fr Safe-T-Centesis catheter was introduced. An ultrasound image was saved for documentation purposes. The paracentesis was performed. The catheter was removed and a dressing was applied. The patient tolerated the procedure well without immediate post procedural complication. FINDINGS: A total of approximately 3.3 L of yellowish fluid was removed. Samples were sent to the laboratory as requested by the clinical team. IMPRESSION: Successful ultrasound-guided paracentesis yielding 3.3 liters of peritoneal fluid. Electronically Signed   By: Aletta Edouard M.D.   On: 05/31/2016 15:50    EKG:  Orders placed or performed during the hospital encounter of 05/05/16  . EKG 12-Lead  . EKG 12-Lead    ASSESSMENT AND PLAN:  Active Problems:   Hyperkalemia  #1. Hyperkalemia in the noncompliant patient with end-stage renal disease, patient is undergoing dialysis now, potassium level has improved after dialysis #2. End-stage renal disease, hemodialysis as per nephrology #3. Abdominal distention with ascites, ordering paracentesis today #4 anemia, get Hemoccult, ferritin level, likely anemia of chronic disease, patient is receiving Epogen with dialysis  Management plans discussed with the patient, family and they are in agreement.   DRUG ALLERGIES: No Known Allergies  CODE STATUS:     Code Status Orders        Start     Ordered   05/31/16 0643  Full code  Continuous     05/31/16 0642    Code Status History    Date Active Date Inactive Code Status Order ID Comments User Context   05/05/2016 11:09 PM 05/10/2016  9:57 PM Full Code [id number]  Vianne Bulls, MD Inpatient   04/16/2016  3:56 AM 04/17/2016  6:27 PM Full Code [id number]  Saundra Shelling, MD Inpatient   01/19/2016  3:13 PM 01/22/2016  2:14 PM Full Code [id number]  Loletha Grayer, MD ED  09/24/2015 10:33 AM 09/25/2015  1:31 PM Full Code [id number]  Loletha Grayer, MD ED   05/16/2015  2:17 PM 05/17/2015  8:57 PM Full Code [id number]  Idelle Crouch, MD Inpatient   04/30/2015  8:08 PM 05/08/2015  7:40 PM Full Code [id number]  Vaughan Basta, MD Inpatient   04/06/2015  5:07 AM 04/07/2015  5:36 PM Full Code [id number]  Lance Coon, MD Inpatient   03/23/2015 11:07 AM 03/27/2015 12:46 PM Full Code [id number]  Bettey Costa, MD Inpatient   06/19/2013 11:32 AM 06/21/2013  5:49 PM Full Code [id number]  Cristal Ford, DO Inpatient   06/16/2013  5:26 PM 06/19/2013 11:32 AM Full Code [id number]  Margarita Mail, PA-C ED   06/05/2013  3:33 PM 06/06/2013  6:56 PM Full Code [id number]  Theodoro Kos, DO Inpatient   06/02/2013  9:01 AM 06/05/2013  3:33 PM Full Code [id number]  Velvet Bathe, MD Inpatient   06/02/2013  8:40 AM 06/02/2013  9:01 AM Full Code [id number]  Velvet Bathe, MD Inpatient      TOTAL TIME TAKING CARE OF THIS PATIENT: 40 minutes.    Theodoro Grist M.D on 05/31/2016 at 4:58 PM  Between 7am to 6pm - Pager - [phone]  After 6pm go to [email or web address] - password [email or web address] [facility]  Office  [phone]  CC: Primary care physician; Elyse Jarvis, MD

## 2016-05-31 NOTE — Progress Notes (Signed)
HD completed. Goal met 1.5L UF. Patient tolerated well.

## 2016-05-31 NOTE — Progress Notes (Signed)
Inpatient Diabetes Program Recommendations  AACE/ADA: New Consensus Statement on Inpatient Glycemic Control (2015)  Target Ranges:  Prepandial:   less than 140 mg/dL      Peak postprandial:   less than 180 mg/dL (1-2 hours)      Critically ill patients:  140 - 180 mg/dL   Lab Results  Component Value Date   GLUCAP 70 05/31/2016   HGBA1C 4.6 03/24/2015    Review of Glycemic Control  Results for Stanley [name], Stanley [name] (MRN [id number]) as of 05/31/2016 11:57  Ref. Range 05/31/2016 04:34 05/31/2016 04:53 05/31/2016 06:31 05/31/2016 06:46 05/31/2016 07:35  Glucose-Capillary Latest Ref Range: 65 - 99 mg/dL 48 (L) 141 (H) 71 66 70    Inpatient Diabetes Program Recommendations:  Since patient has had repeated hypoglycemia events, consider ordering CBG q4h.   Gentry Fitz, RN, BA, MHA, CDE Diabetes Coordinator Inpatient Diabetes Program  [phone] (Team Pager) [phone] ([place]) 05/31/2016 12:00 PM

## 2016-05-31 NOTE — Care Management (Signed)
Elvera Bicker dialysis liaison updated on patient's admission.

## 2016-05-31 NOTE — Progress Notes (Signed)
Pre HD  

## 2016-05-31 NOTE — Progress Notes (Signed)
HD started. 

## 2016-05-31 NOTE — H&P (Signed)
[facility] [place] NAME: Stanley [name]    MR#:  [id number]  DATE OF BIRTH:  1960/06/09  DATE OF ADMISSION:  05/30/2016  PRIMARY CARE PHYSICIAN: Elyse Jarvis, MD   REQUESTING/REFERRING PHYSICIAN:   CHIEF COMPLAINT:   Chief Complaint  Patient presents with  . Abdominal Pain    HISTORY OF PRESENT ILLNESS: Stanley [name]  is a 56 y.o. male with a known history of End-stage renal disease on dialysis cirrhosis of liver, diabetic neuropathy, substance abuse, alcohol abuse, hyperlipidemia, hypertension, peripheral arterial disease presented to the emergency room with shortness of breath. Patient missed dialysis on Saturday. He has shortness of breath which gets worse on exertion. Recent abdominal ultrasound showed very small volume ascites which is not amenable for paracentesis. Patient's potassium was high and was given oral Kayexalate IV calcium gluconate and insulin in the emergency room. No complaints of any chest pain. Has mild abdominal discomfort, no fever and chills. Patient's potassium level was 7.2 at the time of admission. Hospitalist service was consulted for further care of the patient.Not completely awake and alert and unable to give much information.  PAST MEDICAL HISTORY:   Past Medical History:  Diagnosis Date  . Alcohol abuse   . Cirrhosis ([place])   . Coronary artery disease 2009  . Diabetic peripheral neuropathy ([place])   . Drug abuse   . End stage renal disease on dialysis [facility]) NEPHROLOGIST-   DR [facility]  IN [place]   HEMODIALYSIS --   TUES/  THURS/  SAT  . GERD (gastroesophageal reflux disease)   . Hyperlipidemia   . Hypertension   . PAD (peripheral artery disease) ([place])   . Suicidal ideation    & HOMICIDAL IDEATION --  06-16-2013   ADMITTED TO BEHAVIOR HEALTH    PAST SURGICAL HISTORY: Past Surgical History:  Procedure Laterality Date  . CORONARY ANGIOPLASTY  ?   PT UNABLE TO TELL IF  BEFORE OR AFTER  CABG  .  CORONARY ARTERY BYPASS GRAFT  2008  ([place] , [place])   3 VESSEL  . DIALYSIS FISTULA CREATION  LAST SURGERY  APPOX  2008  . ENTEROSCOPY N/A 05/10/2016   Procedure: ENTEROSCOPY;  Surgeon: Jerene Bears, MD;  Location: [place];  Service: Gastroenterology;  Laterality: N/A;  . ESOPHAGOGASTRODUODENOSCOPY N/A 05/07/2015   Procedure: ESOPHAGOGASTRODUODENOSCOPY (EGD);  Surgeon: Hulen Luster, MD;  Location: [facility] ENDOSCOPY;  Service: Endoscopy;  Laterality: N/A;  . ESOPHAGOGASTRODUODENOSCOPY (EGD) WITH PROPOFOL N/A 05/17/2015   Procedure: ESOPHAGOGASTRODUODENOSCOPY (EGD) WITH PROPOFOL;  Surgeon: [name], MD;  Location: [facility] ENDOSCOPY;  Service: Endoscopy;  Laterality: N/A;  . ESOPHAGOGASTRODUODENOSCOPY (EGD) WITH PROPOFOL N/A 01/20/2016   Procedure: ESOPHAGOGASTRODUODENOSCOPY (EGD) WITH PROPOFOL;  Surgeon: [name], MD;  Location: [facility] ENDOSCOPY;  Service: Endoscopy;  Laterality: N/A;  . ESOPHAGOGASTRODUODENOSCOPY (EGD) WITH PROPOFOL N/A 04/17/2016   Procedure: ESOPHAGOGASTRODUODENOSCOPY (EGD) WITH PROPOFOL;  Surgeon: Lin Landsman, MD;  Location: [facility] ENDOSCOPY;  Service: Gastroenterology;  Laterality: N/A;  . ESOPHAGOGASTRODUODENOSCOPY (EGD) WITH PROPOFOL  05/09/2016   Procedure: ESOPHAGOGASTRODUODENOSCOPY (EGD) WITH PROPOFOL;  Surgeon: Jerene Bears, MD;  Location: [place];  Service: Endoscopy;;  . GIVENS CAPSULE STUDY N/A 05/07/2016   Procedure: GIVENS CAPSULE STUDY;  Surgeon: Doran Stabler, MD;  Location: [place];  Service: Endoscopy;  Laterality: N/A;  . MANDIBULAR HARDWARE REMOVAL N/A 07/29/2013   Procedure: REMOVAL OF ARCH BARS;  Surgeon: Theodoro Kos, DO;  Location: [place];  Service: Plastics;  Laterality:  N/A;  . ORIF MANDIBULAR FRACTURE N/A 06/05/2013   Procedure: REPAIR OF MANDIBULAR FRACTURE x 2 with maxillo-mandibular fixation ;  Surgeon: Theodoro Kos, DO;  Location: [place];  Service: Plastics;  Laterality: N/A;  . PERIPHERAL ARTERIAL STENT GRAFT Left      SOCIAL HISTORY:  Social History  Substance Use Topics  . Smoking status: Current Every Day Smoker    Packs/day: 0.15    Years: 40.00    Types: Cigarettes  . Smokeless tobacco: Never Used  . Alcohol use No     Comment: pt reports quitting after learning about cirrhosis    FAMILY HISTORY:  Family History  Problem Relation Age of Onset  . Colon cancer Mother   . Cancer Father   . Cancer Sister     DRUG ALLERGIES: No Known Allergies  REVIEW OF SYSTEMS:  Unable to obtain completely secondary to confusion and disorientation MEDICATIONS AT HOME:  Prior to Admission medications   Medication Sig Start Date End Date Taking? Authorizing Provider  amLODipine (NORVASC) 5 MG tablet Take 5 mg by mouth daily.    Historical Provider, MD  atorvastatin (LIPITOR) 40 MG tablet Take 40 mg by mouth daily. 04/19/16   Historical Provider, MD  b complex-vitamin c-folic acid (NEPHRO-VITE) 0.8 MG TABS tablet Take 1 tablet by mouth daily.    Historical Provider, MD  budesonide-formoterol (SYMBICORT) 160-4.5 MCG/ACT inhaler Inhale 2 puffs into the lungs daily.    Historical Provider, MD  calcium acetate (PHOSLO) 667 MG capsule Take 2,001 mg by mouth 3 (three) times daily before meals.     Historical Provider, MD  cinacalcet (SENSIPAR) 30 MG tablet Take 30 mg by mouth daily with supper.    Historical Provider, MD  fluconazole (DIFLUCAN) 100 MG tablet Take 1 tablet (100 mg total) by mouth daily. 05/11/16   Shanker Kristeen Mans, MD  furosemide (LASIX) 80 MG tablet Take 80 mg by mouth daily. 05/11/16   Historical Provider, MD  gabapentin (NEURONTIN) 300 MG capsule Take 1 capsule (300 mg total) by mouth daily. Patient taking differently: Take 300 mg by mouth at bedtime.  03/27/15   Bettey Costa, MD  hydrOXYzine (VISTARIL) 25 MG capsule Take 1 capsule (25 mg total) by mouth 3 (three) times daily as needed for itching. Patient not taking: Reported on 05/06/2016 02/25/16   Jami L Hagler, PA-C  labetalol (NORMODYNE) 100  MG tablet Take 1 tablet (100 mg total) by mouth daily. 01/22/16   Epifanio Lesches, MD  Multiple Vitamin (MULTIVITAMIN WITH MINERALS) TABS tablet Take 1 tablet by mouth daily. Men's One a Day    Historical Provider, MD  naproxen (NAPROSYN) 500 MG tablet Take 500 mg by mouth 2 (two) times daily as needed. for pain 04/28/16   Historical Provider, MD  nitroGLYCERIN (NITROSTAT) 0.4 MG SL tablet Place 1 tablet (0.4 mg total) under the tongue every 5 (five) minutes as needed. Patient taking differently: Place 0.4 mg under the tongue every 5 (five) minutes as needed for chest pain.  04/12/16   Wende Bushy, MD  oxyCODONE-acetaminophen (ROXICET) 5-325 MG tablet Take 1-2 tablets by mouth every 4 (four) hours as needed for severe pain. 04/21/16   Hinda Kehr, MD  pantoprazole (PROTONIX) 40 MG tablet Take 1 tablet (40 mg total) by mouth daily. 05/10/16   Shanker Kristeen Mans, MD      PHYSICAL EXAMINATION:   VITAL SIGNS: Blood pressure 138/75, pulse 69, temperature 98.5 F (36.9 C), temperature source Oral, resp. rate 16, weight 86.2 kg (190  lb), SpO2 98 %.  GENERAL:  56 y.o.-year-old patient lying in the bed with no acute distress.  EYES: Pupils equal, round, reactive to light and accommodation. No scleral icterus. Extraocular muscles intact.  HEENT: Head atraumatic, normocephalic. Oropharynx and nasopharynx clear.  NECK:  Supple, no jugular venous distention. No thyroid enlargement, no tenderness.  LUNGS: Decreased breath sounds bilaterally, bibasilar crepitations heard. No use of accessory muscles of respiration.  CARDIOVASCULAR: S1, S2 normal. No murmurs, rubs, or gallops.  ABDOMEN: Soft, nontender, nondistended. Bowel sounds present. No organomegaly or mass.  EXTREMITIES: No pedal edema, cyanosis, or clubbing.  NEUROLOGIC: Cranial nerves II through XII are intact. Muscle strength 5/5 in all extremities. Sensation intact. Gait not checked.  PSYCHIATRIC: lethargic, responds to verbal commands Not  completely oriented to time. Oriented to place and self. SKIN: No obvious rash, lesion, or ulcer.   LABORATORY PANEL:   CBC  Recent Labs Lab 05/30/16 2328  WBC 8.1  HGB 8.1*  HCT 25.0*  PLT 263  MCV 87.6  MCH 28.5  MCHC 32.5  RDW 19.2*  LYMPHSABS 0.7*  MONOABS 0.9  EOSABS 0.2  BASOSABS 0.1   ------------------------------------------------------------------------------------------------------------------  Chemistries   Recent Labs Lab 05/30/16 2328  NA 138  K 7.2*  CL 106  CO2 19*  GLUCOSE 96  BUN 82*  CREATININE 12.70*  CALCIUM 12.7*  AST 30  ALT 36  ALKPHOS 164*  BILITOT 0.9   ------------------------------------------------------------------------------------------------------------------ estimated creatinine clearance is 7.7 mL/min (A) (by C-G formula based on SCr of 12.7 mg/dL (H)). ------------------------------------------------------------------------------------------------------------------ No results for input(s): TSH, T4TOTAL, T3FREE, THYROIDAB in the last 72 hours.  Invalid input(s): FREET3   Coagulation profile  Recent Labs Lab 05/30/16 2328  INR 1.29   ------------------------------------------------------------------------------------------------------------------- No results for input(s): DDIMER in the last 72 hours. -------------------------------------------------------------------------------------------------------------------  Cardiac Enzymes No results for input(s): CKMB, TROPONINI, MYOGLOBIN in the last 168 hours.  Invalid input(s): CK ------------------------------------------------------------------------------------------------------------------ Invalid input(s): POCBNP  ---------------------------------------------------------------------------------------------------------------  Urinalysis    Component Value Date/Time   COLORURINE YELLOW (A) 05/03/2015 0815   APPEARANCEUR CLEAR (A) 05/03/2015 0815    APPEARANCEUR Clear 11/13/2013 1314   LABSPEC 1.008 05/03/2015 0815   LABSPEC 1.014 11/13/2013 1314   PHURINE 9.0 (H) 05/03/2015 0815   GLUCOSEU NEGATIVE 05/03/2015 0815   GLUCOSEU 50 mg/dL 11/13/2013 1314   HGBUR NEGATIVE 05/03/2015 0815   BILIRUBINUR NEGATIVE 05/03/2015 0815   BILIRUBINUR Negative 11/13/2013 1314   KETONESUR NEGATIVE 05/03/2015 0815   PROTEINUR 100 (A) 05/03/2015 0815   UROBILINOGEN 0.2 06/22/2014 1230   NITRITE NEGATIVE 05/03/2015 0815   LEUKOCYTESUR NEGATIVE 05/03/2015 0815   LEUKOCYTESUR Negative 11/13/2013 1314     RADIOLOGY: Dg Chest 2 View  Result Date: 05/30/2016 CLINICAL DATA:  Subacute onset of shortness of breath. Initial encounter. EXAM: CHEST  2 VIEW COMPARISON:  Chest radiograph performed 04/21/2016 FINDINGS: The lungs are well-aerated. Vascular congestion is noted. Increased interstitial markings raise concern for mild interstitial edema. There is no evidence of pleural effusion or pneumothorax. The heart is mildly enlarged. The patient is status post median sternotomy. No acute osseous abnormalities are seen. IMPRESSION: Vascular congestion and mild cardiomegaly. Increased interstitial markings raise concern for mild interstitial edema. Electronically Signed   By: Garald Balding M.D.   On: 05/30/2016 23:04    EKG: Orders placed or performed during the hospital encounter of 05/05/16  . EKG 12-Lead  . EKG 12-Lead    IMPRESSION AND PLAN: 56 year old male patient with history of end-stage renal disease on dialysis, cirrhosis of  liver, hypertension, hyperlipidemia, substance abuse presented to the emergency room with difficulty breathing after he missed dialysis. His potassium is elevated around 7.2. Admitting diagnosis 1. Severe hyperkalemia 2. Dyspnea secondary to fluid overload 3. End-stage renal disease on dialysis 4. Disorientation probably secondary to uremia 5. Anemia of chronic disease 6. Hypertension 7. Hyperlipidemia Treatment plan Admit  patient to stepdown unit Patient received IV calcium gluconate, oral Kayexalate and IV insulin in the emergency room for hyperkalemia Repeat potassium level Nephrology consultation for dialysis DVT prophylaxis with subcutaneous heparin IV Lasix for diuresis Telemetry monitoring Renal diet Oxygen via nasal cannula Supportive care  All the records are reviewed and case discussed with ED provider. Management plans discussed with the patient, family and they are in agreement.  CODE STATUS:FULL CODE Code Status History    Date Active Date Inactive Code Status Order ID Comments User Context   05/05/2016 11:09 PM 05/10/2016  9:57 PM Full Code [id number]  Vianne Bulls, MD Inpatient   04/16/2016  3:56 AM 04/17/2016  6:27 PM Full Code [id number]  Saundra Shelling, MD Inpatient   01/19/2016  3:13 PM 01/22/2016  2:14 PM Full Code [id number]  Loletha Grayer, MD ED   09/24/2015 10:33 AM 09/25/2015  1:31 PM Full Code [id number]  Loletha Grayer, MD ED   05/16/2015  2:17 PM 05/17/2015  8:57 PM Full Code [id number]  Idelle Crouch, MD Inpatient   04/30/2015  8:08 PM 05/08/2015  7:40 PM Full Code [id number]  Vaughan Basta, MD Inpatient   04/06/2015  5:07 AM 04/07/2015  5:36 PM Full Code [id number]  Lance Coon, MD Inpatient   03/23/2015 11:07 AM 03/27/2015 12:46 PM Full Code [id number]  Bettey Costa, MD Inpatient   06/19/2013 11:32 AM 06/21/2013  5:49 PM Full Code [id number]  Cristal Ford, DO Inpatient   06/16/2013  5:26 PM 06/19/2013 11:32 AM Full Code [id number]  Margarita Mail, PA-C ED   06/05/2013  3:33 PM 06/06/2013  6:56 PM Full Code [id number]  Power, DO Inpatient   06/02/2013  9:01 AM 06/05/2013  3:33 PM Full Code [id number]  Velvet Bathe, MD Inpatient   06/02/2013  8:40 AM 06/02/2013  9:01 AM Full Code [id number]  Velvet Bathe, MD Inpatient       TOTAL TIME TAKING CARE OF THIS PATIENT: 50 minutes.    Saundra Shelling M.D on 05/31/2016 at 2:49 AM  Between 7am to 6pm - Pager - [phone]  After 6pm go to  [email or web address] - password [email or web address] [facility]  Office  [phone]  CC: Primary care physician; Elyse Jarvis, MD

## 2016-05-31 NOTE — Progress Notes (Signed)
Patient has been A&O x 4 throughout this writer's shift. He had dialysis, tolerated same well. Per dialysis RN, 1.5 L removed. Per radiology note, 3.3 L yellowish fluid removed during paracentesis. Patient verbalized relief s/p this procedure. Last K+ is 5.5. I spoke w/ Dr. Ether Griffins, who ordered patient to go to a med-surg off unit telemetry bed. Order for same being placed at this time.

## 2016-05-31 NOTE — Progress Notes (Signed)
Post HD assessment  

## 2016-05-31 NOTE — Progress Notes (Deleted)
This note also relates to the following rows which could not be included: Pulse Rate - Cannot attach notes to unvalidated device data Resp - Cannot attach notes to unvalidated device data SpO2 - Cannot attach notes to unvalidated device data  HD completed. Goal met 1.5L UF as ordered.

## 2016-05-31 NOTE — Progress Notes (Signed)
[facility]  ROUNDING NOTE   Subjective:   [name] admitted to [facility] on 05/30/2016 for Hyperkalemia [E87.5] SOB (shortness of breath) [R06.02] Cirrhosis of liver with ascites, unspecified hepatic cirrhosis type ([place]) [K74.60] Anemia, unspecified type [D64.9] Stage 5 chronic kidney disease on chronic dialysis ([place]) [N18.6, Z99.2]  Patient was just discharged from [facility] from 2/22-2/27 for GIB  Objective:  Vital signs in last 24 hours:  Temp:  [98.2 F (36.8 C)-98.5 F (36.9 C)] 98.2 F (36.8 C) (03/20 1004) Pulse Rate:  [53-78] 68 (03/20 1129) Resp:  [14-27] 15 (03/20 1129) BP: (117-164)/(67-89) 121/67 (03/20 1129) SpO2:  [94 %-100 %] 99 % (03/20 1129) Weight:  [86.1 kg (189 lb 13.1 oz)-86.5 kg (190 lb 11.2 oz)] 86.5 kg (190 lb 11.2 oz) (03/20 1004)  Weight change:  Filed Weights   05/30/16 2211 05/31/16 0645 05/31/16 1004  Weight: 86.2 kg (190 lb) 86.1 kg (189 lb 13.1 oz) 86.5 kg (190 lb 11.2 oz)    Intake/Output: No intake/output data recorded.   Intake/Output this shift:  No intake/output data recorded.  Physical Exam: General: NAD, laying in bed  Head: Normocephalic, atraumatic. Moist oral mucosal membranes  Eyes: Anicteric, PERRL  Neck: Supple, trachea midline  Lungs:  Clear to auscultation  Heart: Regular rate and rhythm  Abdomen:  Soft, nontender, distended  Extremities:  no peripheral edema.  Neurologic: Nonfocal, moving all four extremities  Skin: No lesions  Access: Right arm AVF    Basic Metabolic Panel:  Recent Labs Lab 05/30/16 2328 05/31/16 0752 05/31/16 1042  NA 138  --  138  K 7.2* 6.3* 5.5*  CL 106  --  105  CO2 19*  --  20*  GLUCOSE 96  --  143*  BUN 82*  --  68*  CREATININE 12.70*  --  10.46*  CALCIUM 12.7*  --  11.7*  PHOS  --   --  6.1*    Liver Function Tests:  Recent Labs Lab 05/30/16 2328 05/31/16 1042  AST 30  --   ALT 36  --   ALKPHOS 164*  --   BILITOT 0.9  --   PROT 7.5   --   ALBUMIN 3.2* 3.0*    Recent Labs Lab 05/30/16 2328  LIPASE 37    Recent Labs Lab 05/30/16 2328  AMMONIA 37*    CBC:  Recent Labs Lab 05/30/16 2328 05/31/16 1042  WBC 8.1 7.4  NEUTROABS 6.2  --   HGB 8.1* 7.3*  HCT 25.0* 23.1*  MCV 87.6 86.7  PLT 263 250    Cardiac Enzymes: No results for input(s): CKTOTAL, CKMB, CKMBINDEX, TROPONINI in the last 168 hours.  BNP: Invalid input(s): POCBNP  CBG:  Recent Labs Lab 05/31/16 0434 05/31/16 0453 05/31/16 0631 05/31/16 0646 05/31/16 0735  GLUCAP 48* 141* 71 86 70    Microbiology: Results for orders placed or performed during the hospital encounter of 05/30/16  MRSA PCR Screening     Status: Abnormal   Collection Time: 05/31/16  6:45 AM  Result Value Ref Range Status   MRSA by PCR POSITIVE (A) NEGATIVE Final    Comment:        The GeneXpert MRSA Assay (FDA approved for NASAL specimens only), is one component of a comprehensive MRSA colonization surveillance program. It is not intended to diagnose MRSA infection nor to guide or monitor treatment for MRSA infections. RESULT CALLED TO, READ BACK BY AND VERIFIED WITH:  BRAD CHISMON AT [phone]  05/31/16 SDR     Coagulation Studies:  Recent Labs  05/30/16 2328  LABPROT 16.2*  INR 1.29    Urinalysis: No results for input(s): COLORURINE, LABSPEC, PHURINE, GLUCOSEU, HGBUR, BILIRUBINUR, KETONESUR, PROTEINUR, UROBILINOGEN, NITRITE, LEUKOCYTESUR in the last 72 hours.  Invalid input(s): APPERANCEUR    Imaging: Dg Chest 2 View  Result Date: 05/30/2016 CLINICAL DATA:  Subacute onset of shortness of breath. Initial encounter. EXAM: CHEST  2 VIEW COMPARISON:  Chest radiograph performed 04/21/2016 FINDINGS: The lungs are well-aerated. Vascular congestion is noted. Increased interstitial markings raise concern for mild interstitial edema. There is no evidence of pleural effusion or pneumothorax. The heart is mildly enlarged. The patient is status post median  sternotomy. No acute osseous abnormalities are seen. IMPRESSION: Vascular congestion and mild cardiomegaly. Increased interstitial markings raise concern for mild interstitial edema. Electronically Signed   By: Garald Balding M.D.   On: 05/30/2016 23:04     Medications:    . amLODipine  5 mg Oral Daily  . atorvastatin  40 mg Oral Daily  . calcium acetate  2,001 mg Oral TID AC  . cinacalcet  30 mg Oral Q supper  . furosemide  40 mg Intravenous Q12H  . gabapentin  300 mg Oral Daily  . heparin  5,000 Units Subcutaneous Q8H  . labetalol  100 mg Oral Daily  . mometasone-formoterol  2 puff Inhalation BID  . multivitamin  1 tablet Oral QHS  . multivitamin with minerals  1 tablet Oral Daily  . pantoprazole  40 mg Oral Daily  . sodium chloride flush  3 mL Intravenous Q12H  . sodium chloride flush  3 mL Intravenous Q12H   sodium chloride, acetaminophen **OR** acetaminophen, nitroGLYCERIN, ondansetron **OR** ondansetron (ZOFRAN) IV, oxyCODONE-acetaminophen, senna-docusate, sodium chloride flush  Assessment/ Plan:  [name] is a 56 y.o. black male with ESRD on hemodialysis, hypertension, peripheral vascular disease, coronary artery disease, cirrhosis, substance abuse.   TTS [facility]. Right arm AVF EDW 81.5  1. End stage renal disease with hyperkalemia: emergent hemodialysis this morning.  Given kayexalate.   2. Secondary Hyperparathyroidism with hypercalcemia and hyperphosphatemia: given IV calcium gluconate in ED.  PTH low at 34. Calcium and phos are very high - discontinue cinacalcet - furosemide - discontinue calcium acetate and start sevelamer with meals.   3. Hypertension: blood pressure at goal.  - labetalol and amlodipine  4. Anemia of chronic kidney disease: hemoglobin 7.3 - epo 10000 with HD   LOS: 0 Lynore Coscia 3/20/201811:36 AM

## 2016-05-31 NOTE — ED Notes (Signed)
Pt diaphoretic. Blood glucose reading 48. MD made aware.

## 2016-05-31 NOTE — Progress Notes (Signed)
CBG 66, 1 4 oz cup of apple juice given immediately.

## 2016-06-01 ENCOUNTER — Encounter: Payer: Self-pay | Admitting: *Deleted

## 2016-06-01 ENCOUNTER — Ambulatory Visit
Admission: RE | Admit: 2016-06-01 | Discharge: 2016-06-01 | Disposition: A | Discharge status: Home / Self Care | Payer: Medicare Other | Source: Ambulatory Visit | Attending: Internal Medicine | Admitting: Internal Medicine

## 2016-06-01 DIAGNOSIS — Z22322 Carrier or suspected carrier of Methicillin resistant Staphylococcus aureus: Secondary | ICD-10-CM

## 2016-06-01 DIAGNOSIS — D638 Anemia in other chronic diseases classified elsewhere: Secondary | ICD-10-CM

## 2016-06-01 LAB — RENAL FUNCTION PANEL
ALBUMIN: 2.8 g/dL — AB (ref 3.5–5.0)
ANION GAP: 11 (ref 5–15)
BUN: 52 mg/dL — ABNORMAL HIGH (ref 6–20)
CO2: 25 mmol/L (ref 22–32)
Calcium: 10.3 mg/dL (ref 8.9–10.3)
Chloride: 103 mmol/L (ref 101–111)
Creatinine, Ser: 9.47 mg/dL — ABNORMAL HIGH (ref 0.61–1.24)
GFR calc Af Amer: 6 mL/min — ABNORMAL LOW (ref >60)
GFR calc non Af Amer: 5 mL/min — ABNORMAL LOW (ref >60)
GLUCOSE: 92 mg/dL (ref 65–99)
PHOSPHORUS: 5.7 mg/dL — AB (ref 2.5–4.6)
POTASSIUM: 5.3 mmol/L — AB (ref 3.5–5.1)
Sodium: 139 mmol/L (ref 135–145)

## 2016-06-01 LAB — CBC
HCT: 22.7 % — ABNORMAL LOW (ref 40.0–52.0)
Hemoglobin: 7.2 g/dL — ABNORMAL LOW (ref 13.0–18.0)
MCH: 27.1 pg (ref 26.0–34.0)
MCHC: 31.6 g/dL — AB (ref 32.0–36.0)
MCV: 85.9 fL (ref 80.0–100.0)
PLATELETS: 257 10*3/uL (ref 150–440)
RBC: 2.65 MIL/uL — ABNORMAL LOW (ref 4.40–5.90)
RDW: 19.2 % — AB (ref 11.5–14.5)
WBC: 6.2 10*3/uL (ref 3.8–10.6)

## 2016-06-01 LAB — PATHOLOGIST SMEAR REVIEW

## 2016-06-01 MED ORDER — MUPIROCIN 2 % EX OINT
1.0000 "application " | TOPICAL_OINTMENT | Freq: Two times a day (BID) | CUTANEOUS | Status: DC
  Filled 2016-06-01: qty 22

## 2016-06-01 MED ORDER — MUPIROCIN 2 % EX OINT: 1.0000 "application " | TOPICAL_OINTMENT | Freq: Two times a day (BID) | CUTANEOUS | 0 refills | Status: DC

## 2016-06-01 MED ORDER — SEVELAMER CARBONATE 800 MG PO TABS: 2400.0000 mg | ORAL_TABLET | Freq: Three times a day (TID) | ORAL | 1 refills | Status: DC

## 2016-06-01 MED ORDER — TUBERCULIN PPD 5 UNIT/0.1ML ID SOLN
5.0000 [IU] | Freq: Once | INTRADERMAL | Status: DC
  Filled 2016-06-01: qty 0.1

## 2016-06-01 MED ORDER — SODIUM POLYSTYRENE SULFONATE 15 GM/60ML PO SUSP
30.0000 g | Freq: Once | ORAL | Status: AC
  Administered 2016-06-01: 30 g via ORAL
  Filled 2016-06-01: qty 120

## 2016-06-01 MED ORDER — CHLORHEXIDINE GLUCONATE CLOTH 2 % EX PADS: 6.0000 | MEDICATED_PAD | Freq: Every day | CUTANEOUS | Status: DC

## 2016-06-01 NOTE — Discharge Summary (Signed)
[facility] NAME: Stanley [name]    MR#:  [id number]  DATE OF BIRTH:  27-Sep-1960  DATE OF ADMISSION:  05/30/2016 ADMITTING PHYSICIAN: Saundra Shelling, MD  DATE OF DISCHARGE: No discharge date for patient encounter.  PRIMARY CARE PHYSICIAN: Elyse Jarvis, MD     ADMISSION DIAGNOSIS:  Hyperkalemia [E87.5] SOB (shortness of breath) [R06.02] Cirrhosis of liver with ascites, unspecified hepatic cirrhosis type (HCC) [K74.60] Anemia, unspecified type [D64.9] Stage 5 chronic kidney disease on chronic dialysis (HCC) [N18.6, Z99.2]  DISCHARGE DIAGNOSIS:  Principal Problem:   Hyperkalemia Active Problems:   Cirrhosis of liver with ascites (HCC)   Anemia of chronic disease   MRSA carrier   SECONDARY DIAGNOSIS:   Past Medical History:  Diagnosis Date  . Alcohol abuse   . Cirrhosis ([place])   . Coronary artery disease 2009  . Diabetic peripheral neuropathy ([place])   . Drug abuse   . End stage renal disease on dialysis [facility]) NEPHROLOGIST-   DR [facility]  IN [place]   HEMODIALYSIS --   TUES/  THURS/  SAT  . GERD (gastroesophageal reflux disease)   . Hyperlipidemia   . Hypertension   . PAD (peripheral artery disease) ([place])   . Suicidal ideation    & HOMICIDAL IDEATION --  06-16-2013   ADMITTED TO BEHAVIOR HEALTH    .pro HOSPITAL COURSE:  The patient is a 56 year old [place] American male with past medical history significant for history of end-stage renal disease, on dialysis, liver cirrhosis, diabetic neuropathy, substance abuse, Alcohol abuse, hyperlipidemia, hypertension, cardiovascular disease, who presented to the hospital with complaints of shortness of breath, dyspnea on exertion. Recent abdominal ultrasound revealed ascites. Patient complained of abdominal distention, shortness of breath, but no chest pains. On arrival to emergency room, patient's potassium level was elevated at 7.2. Nephrology was consulted and  patient underwent urgent hemodialysis 05/31/2016. The patient underwent paracentesis 20th of March 2018 as well, 3.3 L of fluid was removed. Labs were unremarkable. With conservative therapy, patient's condition improved and he was ready to be discharged home. His potassium level was 5.3 prior to administration of Kayexalate on the day of discharge. Patient will be undergoing hemodialysis tomorrow, 06/02/2016, as outpatient. #1. Hyperkalemia in noncompliant patient with end-stage renal disease, status post urgent hemodialysis 05/31/2016, potassium level has improved, patient is to undergo hemodialysis tomorrow, 06/02/2016, as outpatient. The patient is stable to be discharged home today after Kayexalate is administered #2. End-stage renal disease, next hemodialysis as outpatient #3. Chronic liver disease with ascites, status post paracentesis 05/31/2016 was 2.3 L of fluid removed, patient feels better today. Advised to continue dialysis as per schedule to remove fluids. #4 anemia, Hemoccult was ordered, not obtained, suspected anemia of chronic disease, patient received Epogen with dialysis, follow hemoglobin level as outpatient  DISCHARGE CONDITIONS:   Stable  CONSULTS OBTAINED:  Treatment Team:  Lavonia Dana, MD  DRUG ALLERGIES:  No Known Allergies  DISCHARGE MEDICATIONS:   Current Discharge Medication List    START taking these medications   Details  mupirocin ointment (BACTROBAN) 2 % Place 1 application into the nose 2 (two) times daily. Qty: 22 g, Refills: 0    sevelamer carbonate (RENVELA) 800 MG tablet Take 3 tablets (2,400 mg total) by mouth 3 (three) times daily with meals. Qty: 270 tablet, Refills: 1      CONTINUE these medications which have NOT CHANGED   Details  amLODipine (NORVASC) 5 MG tablet Take 5  mg by mouth daily.    atorvastatin (LIPITOR) 40 MG tablet Take 40 mg by mouth daily. Refills: 11    b complex-vitamin c-folic acid (NEPHRO-VITE) 0.8 MG TABS tablet  Take 1 tablet by mouth daily.    budesonide-formoterol (SYMBICORT) 160-4.5 MCG/ACT inhaler Inhale 2 puffs into the lungs daily.    furosemide (LASIX) 80 MG tablet Take 80 mg by mouth daily. Refills: 11    gabapentin (NEURONTIN) 300 MG capsule Take 1 capsule (300 mg total) by mouth daily. Qty: 30 capsule, Refills: 0    hydrOXYzine (VISTARIL) 25 MG capsule Take 1 capsule (25 mg total) by mouth 3 (three) times daily as needed for itching. Qty: 21 capsule, Refills: 0    labetalol (NORMODYNE) 100 MG tablet Take 1 tablet (100 mg total) by mouth daily. Qty: 30 tablet, Refills: 0    Multiple Vitamin (MULTIVITAMIN WITH MINERALS) TABS tablet Take 1 tablet by mouth daily. Men's One a Day    naproxen (NAPROSYN) 500 MG tablet Take 500 mg by mouth 2 (two) times daily as needed. for pain Refills: 0    nitroGLYCERIN (NITROSTAT) 0.4 MG SL tablet Place 1 tablet (0.4 mg total) under the tongue every 5 (five) minutes as needed. Qty: 25 tablet, Refills: 6    oxyCODONE-acetaminophen (ROXICET) 5-325 MG tablet Take 1-2 tablets by mouth every 4 (four) hours as needed for severe pain. Qty: 20 tablet, Refills: 0    pantoprazole (PROTONIX) 40 MG tablet Take 1 tablet (40 mg total) by mouth daily. Qty: 30 tablet, Refills: 0      STOP taking these medications     calcium acetate (PHOSLO) 667 MG capsule      cinacalcet (SENSIPAR) 30 MG tablet      fluconazole (DIFLUCAN) 100 MG tablet          DISCHARGE INSTRUCTIONS:    The patient is to follow-up with primary care physician, outpatient dialysis as per schedule  If you experience worsening of your admission symptoms, develop shortness of breath, life threatening emergency, suicidal or homicidal thoughts you must seek medical attention immediately by calling 911 or calling your MD immediately  if symptoms less severe.  You Must read complete instructions/literature along with all the possible adverse reactions/side effects for all the Medicines you  take and that have been prescribed to you. Take any new Medicines after you have completely understood and accept all the possible adverse reactions/side effects.   Please note  You were cared for by a hospitalist during your hospital stay. If you have any questions about your discharge medications or the care you received while you were in the hospital after you are discharged, you can call the unit and asked to speak with the hospitalist on call if the hospitalist that took care of you is not available. Once you are discharged, your primary care physician will handle any further medical issues. Please note that NO REFILLS for any discharge medications will be authorized once you are discharged, as it is imperative that you return to your primary care physician (or establish a relationship with a primary care physician if you do not have one) for your aftercare needs so that they can reassess your need for medications and monitor your lab values.    Today   CHIEF COMPLAINT:   Chief Complaint  Patient presents with  . Abdominal Pain    HISTORY OF PRESENT ILLNESS:  Stanley [name]  is a 56 y.o. male with a known history of end-stage renal disease, on  dialysis, liver cirrhosis, diabetic neuropathy, substance abuse, Alcohol abuse, hyperlipidemia, hypertension, cardiovascular disease, who presented to the hospital with complaints of shortness of breath, dyspnea on exertion. Recent abdominal ultrasound revealed ascites. Patient complained of abdominal distention, shortness of breath, but no chest pains. On arrival to emergency room, patient's potassium level was elevated at 7.2. Nephrology was consulted and patient underwent urgent hemodialysis 05/31/2016. The patient underwent paracentesis 20th of March 2018 as well, 3.3 L of fluid was removed. Labs were unremarkable. With conservative therapy, patient's condition improved and he was ready to be discharged home. His potassium level was 5.3 prior to  administration of Kayexalate on the day of discharge. Patient will be undergoing hemodialysis tomorrow, 06/02/2016, as outpatient. #1. Hyperkalemia in noncompliant patient with end-stage renal disease, status post urgent hemodialysis 05/31/2016, potassium level has improved, patient is to undergo hemodialysis tomorrow, 06/02/2016, as outpatient. The patient is stable to be discharged home today after Kayexalate is administered #2. End-stage renal disease, next hemodialysis as outpatient #3. Chronic liver disease with ascites, status post paracentesis 05/31/2016 was 2.3 L of fluid removed, patient feels better today. Advised to continue dialysis as per schedule to remove fluids. #4 anemia, Hemoccult was ordered, not obtained, suspected anemia of chronic disease, patient received Epogen with dialysis, follow hemoglobin level as outpatient     VITAL SIGNS:  Blood pressure (!) 100/57, pulse 63, temperature 99.2 F (37.3 C), temperature source Oral, resp. rate 10, height 6\' 3"  (1.905 m), weight 84.5 kg (186 lb 4.6 oz), SpO2 98 %.  I/O:   Intake/Output Summary (Last 24 hours) at 06/01/16 1754 Last data filed at 05/31/16 2200  Gross per 24 hour  Intake              240 ml  Output              200 ml  Net               40 ml    PHYSICAL EXAMINATION:  GENERAL:  56 y.o.-year-old patient lying in the bed with no acute distress.  EYES: Pupils equal, round, reactive to light and accommodation. No scleral icterus. Extraocular muscles intact.  HEENT: Head atraumatic, normocephalic. Oropharynx and nasopharynx clear.  NECK:  Supple, no jugular venous distention. No thyroid enlargement, no tenderness.  LUNGS: Normal breath sounds bilaterally, no wheezing, rales,rhonchi or crepitation. No use of accessory muscles of respiration.  CARDIOVASCULAR: S1, S2 normal. No murmurs, rubs, or gallops.  ABDOMEN: Soft, non-tender, non-distended. Bowel sounds present. No organomegaly or mass.  EXTREMITIES: No pedal  edema, cyanosis, or clubbing.  NEUROLOGIC: Cranial nerves II through XII are intact. Muscle strength 5/5 in all extremities. Sensation intact. Gait not checked.  PSYCHIATRIC: The patient is alert and oriented x 3.  SKIN: No obvious rash, lesion, or ulcer.   DATA REVIEW:   CBC  Recent Labs Lab 06/01/16 0426  WBC 6.2  HGB 7.2*  HCT 22.7*  PLT 257    Chemistries   Recent Labs Lab 05/30/16 2328  06/01/16 0829  NA 138  < > 139  K 7.2*  < > 5.3*  CL 106  < > 103  CO2 19*  < > 25  GLUCOSE 96  < > 92  BUN 82*  < > 52*  CREATININE 12.70*  < > 9.47*  CALCIUM 12.7*  < > 10.3  AST 30  --   --   ALT 36  --   --   ALKPHOS 164*  --   --  BILITOT 0.9  --   --   < > = values in this interval not displayed.  Cardiac Enzymes No results for input(s): TROPONINI in the last 168 hours.  Microbiology Results  Results for orders placed or performed during the hospital encounter of 05/30/16  MRSA PCR Screening     Status: Abnormal   Collection Time: 05/31/16  6:45 AM  Result Value Ref Range Status   MRSA by PCR POSITIVE (A) NEGATIVE Final    Comment:        The GeneXpert MRSA Assay (FDA approved for NASAL specimens only), is one component of a comprehensive MRSA colonization surveillance program. It is not intended to diagnose MRSA infection nor to guide or monitor treatment for MRSA infections. RESULT CALLED TO, READ BACK BY AND VERIFIED WITH:  BRAD CHISMON AT 6967 05/31/16 SDR   Body fluid culture     Status: None (Preliminary result)   Collection Time: 05/31/16  3:20 PM  Result Value Ref Range Status   Specimen Description PERITONEAL  Final   Special Requests NONE  Final   Gram Stain   Final    FEW WBC PRESENT, PREDOMINANTLY MONONUCLEAR NO ORGANISMS SEEN    Culture   Final    NO GROWTH < 12 HOURS Performed at [facility], [address], [place], [place] [address]    Report Status PENDING  Incomplete    RADIOLOGY:  Dg Chest 2 View  Result Date:  05/30/2016 CLINICAL DATA:  Subacute onset of shortness of breath. Initial encounter. EXAM: CHEST  2 VIEW COMPARISON:  Chest radiograph performed 04/21/2016 FINDINGS: The lungs are well-aerated. Vascular congestion is noted. Increased interstitial markings raise concern for mild interstitial edema. There is no evidence of pleural effusion or pneumothorax. The heart is mildly enlarged. The patient is status post median sternotomy. No acute osseous abnormalities are seen. IMPRESSION: Vascular congestion and mild cardiomegaly. Increased interstitial markings raise concern for mild interstitial edema. Electronically Signed   By: Garald Balding M.D.   On: 05/30/2016 23:04   US Paracentesis  Result Date: 05/31/2016 INDICATION: Cirrhosis and ascites. EXAM: ULTRASOUND GUIDED PARACENTESIS MEDICATIONS: None. COMPLICATIONS: None immediate. PROCEDURE: Informed written consent was obtained from the patient after a discussion of the risks, benefits and alternatives to treatment. A timeout was performed prior to the initiation of the procedure. Initial ultrasound was performed to localize ascites. The right lower abdomen was prepped and draped in the usual sterile fashion. 1% lidocaine was used for local anesthesia. Following this, a 6 Fr Safe-T-Centesis catheter was introduced. An ultrasound image was saved for documentation purposes. The paracentesis was performed. The catheter was removed and a dressing was applied. The patient tolerated the procedure well without immediate post procedural complication. FINDINGS: A total of approximately 3.3 L of yellowish fluid was removed. Samples were sent to the laboratory as requested by the clinical team. IMPRESSION: Successful ultrasound-guided paracentesis yielding 3.3 liters of peritoneal fluid. Electronically Signed   By: Aletta Edouard M.D.   On: 05/31/2016 15:50    EKG:   Orders placed or performed during the hospital encounter of 05/05/16  . EKG 12-Lead  . EKG 12-Lead       Management plans discussed with the patient, family and they are in agreement.  CODE STATUS:     Code Status Orders        Start     Ordered   05/31/16 0643  Full code  Continuous     05/31/16 [phone]  Code Status History    Date Active Date Inactive Code Status Order ID Comments User Context   05/05/2016 11:09 PM 05/10/2016  9:57 PM Full Code [id number]  Vianne Bulls, MD Inpatient   04/16/2016  3:56 AM 04/17/2016  6:27 PM Full Code [id number]  Saundra Shelling, MD Inpatient   01/19/2016  3:13 PM 01/22/2016  2:14 PM Full Code [id number]  Loletha Grayer, MD ED   09/24/2015 10:33 AM 09/25/2015  1:31 PM Full Code [id number]  Loletha Grayer, MD ED   05/16/2015  2:17 PM 05/17/2015  8:57 PM Full Code [id number]  Idelle Crouch, MD Inpatient   04/30/2015  8:08 PM 05/08/2015  7:40 PM Full Code [id number]  Vaughan Basta, MD Inpatient   04/06/2015  5:07 AM 04/07/2015  5:36 PM Full Code [id number]  Lance Coon, MD Inpatient   03/23/2015 11:07 AM 03/27/2015 12:46 PM Full Code [id number]  Bettey Costa, MD Inpatient   06/19/2013 11:32 AM 06/21/2013  5:49 PM Full Code [id number]  Cristal Ford, DO Inpatient   06/16/2013  5:26 PM 06/19/2013 11:32 AM Full Code [id number]  Margarita Mail, PA-C ED   06/05/2013  3:33 PM 06/06/2013  6:56 PM Full Code [id number]  Bruin, DO Inpatient   06/02/2013  9:01 AM 06/05/2013  3:33 PM Full Code [id number]  Velvet Bathe, MD Inpatient   06/02/2013  8:40 AM 06/02/2013  9:01 AM Full Code [id number]  Velvet Bathe, MD Inpatient      TOTAL TIME TAKING CARE OF THIS PATIENT: 40 minutes.    Theodoro Grist M.D on 06/01/2016 at 5:54 PM  Between 7am to 6pm - Pager - [phone]  After 6pm go to [email or web address] - password [email or web address] [facility]  Office  [phone]  CC: Primary care physician; Elyse Jarvis, MD

## 2016-06-01 NOTE — Progress Notes (Signed)
Patient A&O X 4 throughout shift. Eyes closed for most of the shift. VSS. Pending transfer to floor.

## 2016-06-01 NOTE — Progress Notes (Signed)
[facility]  ROUNDING NOTE   Subjective:   Hemodialysis yesterday. Tolerated treatment well. UF of 1.5 litres  Large volume paracentesis 3.3 litres removed.   Objective:  Vital signs in last 24 hours:  Temp:  [98 F (36.7 C)-99.2 F (37.3 C)] 99.2 F (37.3 C) (03/20 2000) Pulse Rate:  [60-73] 64 (03/21 0600) Resp:  [12-23] 14 (03/21 0600) BP: (107-144)/(50-89) 119/63 (03/21 0600) SpO2:  [92 %-100 %] 92 % (03/21 0600) Weight:  [84.5 kg (186 lb 4.6 oz)-86.5 kg (190 lb 11.2 oz)] 84.5 kg (186 lb 4.6 oz) (03/20 1403)  Weight change: 0.317 kg (11.2 oz) Filed Weights   05/31/16 0645 05/31/16 1004 05/31/16 1403  Weight: 86.1 kg (189 lb 13.1 oz) 86.5 kg (190 lb 11.2 oz) 84.5 kg (186 lb 4.6 oz)    Intake/Output: I/O last 3 completed shifts: In: 240 [P.O.:240] Out: 1700 [Urine:200; Other:1500]   Intake/Output this shift:  No intake/output data recorded.  Physical Exam: General: NAD, laying in bed  Head: Normocephalic, atraumatic. Moist oral mucosal membranes  Eyes: Anicteric, PERRL  Neck: Supple, trachea midline  Lungs:  Clear to auscultation  Heart: Regular rate and rhythm  Abdomen:  Soft, nontender, distended  Extremities:  no peripheral edema.  Neurologic: Nonfocal, moving all four extremities  Skin: No lesions  Access: Right arm AVF    Basic Metabolic Panel:  Recent Labs Lab 05/30/16 2328 05/31/16 0752 05/31/16 1042  NA 138  --  138  K 7.2* 6.3* 5.5*  CL 106  --  105  CO2 19*  --  20*  GLUCOSE 96  --  143*  BUN 82*  --  68*  CREATININE 12.70*  --  10.46*  CALCIUM 12.7*  --  11.7*  PHOS  --   --  6.1*    Liver Function Tests:  Recent Labs Lab 05/30/16 2328 05/31/16 1042  AST 30  --   ALT 36  --   ALKPHOS 164*  --   BILITOT 0.9  --   PROT 7.5  --   ALBUMIN 3.2* 3.0*    Recent Labs Lab 05/30/16 2328  LIPASE 37    Recent Labs Lab 05/30/16 2328  AMMONIA 37*    CBC:  Recent Labs Lab 05/30/16 2328 05/31/16 1042  06/01/16 0426  WBC 8.1 7.4 6.2  NEUTROABS 6.2  --   --   HGB 8.1* 7.3* 7.2*  HCT 25.0* 23.1* 22.7*  MCV 87.6 86.7 85.9  PLT 263 250 257    Cardiac Enzymes: No results for input(s): CKTOTAL, CKMB, CKMBINDEX, TROPONINI in the last 168 hours.  BNP: Invalid input(s): POCBNP  CBG:  Recent Labs Lab 05/31/16 0434 05/31/16 0453 05/31/16 0631 05/31/16 0646 05/31/16 0735  GLUCAP 48* 141* 71 26 70    Microbiology: Results for orders placed or performed during the hospital encounter of 05/30/16  MRSA PCR Screening     Status: Abnormal   Collection Time: 05/31/16  6:45 AM  Result Value Ref Range Status   MRSA by PCR POSITIVE (A) NEGATIVE Final    Comment:        The GeneXpert MRSA Assay (FDA approved for NASAL specimens only), is one component of a comprehensive MRSA colonization surveillance program. It is not intended to diagnose MRSA infection nor to guide or monitor treatment for MRSA infections. RESULT CALLED TO, READ BACK BY AND VERIFIED WITH:  BRAD CHISMON AT 4967 05/31/16 SDR   Body fluid culture     Status: None (Preliminary result)  Collection Time: 05/31/16  3:20 PM  Result Value Ref Range Status   Specimen Description PERITONEAL  Final   Special Requests NONE  Final   Gram Stain   Final    FEW WBC PRESENT, PREDOMINANTLY MONONUCLEAR NO ORGANISMS SEEN Performed at [facility], [address], [place], [place] [address]    Culture PENDING  Incomplete   Report Status PENDING  Incomplete    Coagulation Studies:  Recent Labs  05/30/16 2328  LABPROT 16.2*  INR 1.29    Urinalysis: No results for input(s): COLORURINE, LABSPEC, PHURINE, GLUCOSEU, HGBUR, BILIRUBINUR, KETONESUR, PROTEINUR, UROBILINOGEN, NITRITE, LEUKOCYTESUR in the last 72 hours.  Invalid input(s): APPERANCEUR    Imaging: Dg Chest 2 View  Result Date: 05/30/2016 CLINICAL DATA:  Subacute onset of shortness of breath. Initial encounter. EXAM: CHEST  2 VIEW COMPARISON:  Chest  radiograph performed 04/21/2016 FINDINGS: The lungs are well-aerated. Vascular congestion is noted. Increased interstitial markings raise concern for mild interstitial edema. There is no evidence of pleural effusion or pneumothorax. The heart is mildly enlarged. The patient is status post median sternotomy. No acute osseous abnormalities are seen. IMPRESSION: Vascular congestion and mild cardiomegaly. Increased interstitial markings raise concern for mild interstitial edema. Electronically Signed   By: Garald Balding M.D.   On: 05/30/2016 23:04   US Paracentesis  Result Date: 05/31/2016 INDICATION: Cirrhosis and ascites. EXAM: ULTRASOUND GUIDED PARACENTESIS MEDICATIONS: None. COMPLICATIONS: None immediate. PROCEDURE: Informed written consent was obtained from the patient after a discussion of the risks, benefits and alternatives to treatment. A timeout was performed prior to the initiation of the procedure. Initial ultrasound was performed to localize ascites. The right lower abdomen was prepped and draped in the usual sterile fashion. 1% lidocaine was used for local anesthesia. Following this, a 6 Fr Safe-T-Centesis catheter was introduced. An ultrasound image was saved for documentation purposes. The paracentesis was performed. The catheter was removed and a dressing was applied. The patient tolerated the procedure well without immediate post procedural complication. FINDINGS: A total of approximately 3.3 L of yellowish fluid was removed. Samples were sent to the laboratory as requested by the clinical team. IMPRESSION: Successful ultrasound-guided paracentesis yielding 3.3 liters of peritoneal fluid. Electronically Signed   By: Aletta Edouard M.D.   On: 05/31/2016 15:50     Medications:    . amLODipine  5 mg Oral Daily  . atorvastatin  40 mg Oral Daily  . epoetin (EPOGEN/PROCRIT) injection  10,000 Units Intravenous Q T,Th,Sa-HD  . furosemide  40 mg Intravenous Q12H  . gabapentin  300 mg Oral Daily   . labetalol  100 mg Oral Daily  . mometasone-formoterol  2 puff Inhalation BID  . multivitamin  1 tablet Oral QHS  . multivitamin with minerals  1 tablet Oral Daily  . pantoprazole  40 mg Oral Daily  . sevelamer carbonate  2,400 mg Oral TID WC  . sodium chloride flush  3 mL Intravenous Q12H  . sodium chloride flush  3 mL Intravenous Q12H  . tuberculin  5 Units Intradermal Once   sodium chloride, acetaminophen **OR** acetaminophen, nitroGLYCERIN, ondansetron **OR** ondansetron (ZOFRAN) IV, oxyCODONE-acetaminophen, senna-docusate, sodium chloride flush  Assessment/ Plan:  [name] is a 56 y.o. black male with ESRD on hemodialysis, hypertension, peripheral vascular disease, coronary artery disease, cirrhosis, substance abuse.   TTS [facility]. Right arm AVF EDW 81.5  1. End stage renal disease with hyperkalemia: tolerated treatment yesterday. Continue TTS schedule  2. Secondary Hyperparathyroidism  with hypercalcemia and hyperphosphatemia: given IV calcium gluconate in ED.  PTH low at 34. Calcium and phos are very high - discontinued cinacalcet - furosemide - discontinued calcium acetate and start sevelamer with meals.   3. Hypertension: blood pressure at goal.  - labetalol and amlodipine  4. Anemia of chronic kidney disease: hemoglobin 7.2 - epo 10000 with HD   LOS: [place], [place] 3/21/20189:12 AM

## 2016-06-02 DIAGNOSIS — N2581 Secondary hyperparathyroidism of renal origin: Secondary | ICD-10-CM | POA: Diagnosis not present

## 2016-06-02 DIAGNOSIS — Z992 Dependence on renal dialysis: Secondary | ICD-10-CM | POA: Diagnosis not present

## 2016-06-02 DIAGNOSIS — D509 Iron deficiency anemia, unspecified: Secondary | ICD-10-CM | POA: Diagnosis not present

## 2016-06-02 DIAGNOSIS — N186 End stage renal disease: Secondary | ICD-10-CM | POA: Diagnosis not present

## 2016-06-02 DIAGNOSIS — D631 Anemia in chronic kidney disease: Secondary | ICD-10-CM | POA: Diagnosis not present

## 2016-06-02 LAB — HEPATITIS B CORE ANTIBODY, IGM: HEP B C IGM: NEGATIVE

## 2016-06-02 LAB — HEPATITIS B SURFACE ANTIGEN: HEP B S AG: NEGATIVE

## 2016-06-02 LAB — HEPATITIS B SURFACE ANTIBODY,QUALITATIVE: Hep B S Ab: REACTIVE

## 2016-06-03 ENCOUNTER — Telehealth: Payer: Self-pay | Admitting: Cardiology

## 2016-06-03 NOTE — Telephone Encounter (Signed)
Spoke with patient and rescheduled his stress test for 06/09/16 and reviewed all instructions with him. He verbalized understanding and had no further questions at this time.

## 2016-06-03 NOTE — Telephone Encounter (Signed)
Pt need to reschedule his Myoview  Please call back

## 2016-06-04 DIAGNOSIS — D631 Anemia in chronic kidney disease: Secondary | ICD-10-CM | POA: Diagnosis not present

## 2016-06-04 DIAGNOSIS — Z992 Dependence on renal dialysis: Secondary | ICD-10-CM | POA: Diagnosis not present

## 2016-06-04 DIAGNOSIS — N186 End stage renal disease: Secondary | ICD-10-CM | POA: Diagnosis not present

## 2016-06-04 DIAGNOSIS — N2581 Secondary hyperparathyroidism of renal origin: Secondary | ICD-10-CM | POA: Diagnosis not present

## 2016-06-04 DIAGNOSIS — D509 Iron deficiency anemia, unspecified: Secondary | ICD-10-CM | POA: Diagnosis not present

## 2016-06-04 LAB — BODY FLUID CULTURE: CULTURE: NO GROWTH

## 2016-06-09 ENCOUNTER — Ambulatory Visit
Admission: RE | Admit: 2016-06-09 | Discharge: 2016-06-09 | Disposition: A | Discharge status: Home / Self Care | Payer: Medicare Other | Source: Ambulatory Visit | Attending: Cardiology | Admitting: Cardiology

## 2016-06-09 DIAGNOSIS — Z951 Presence of aortocoronary bypass graft: Secondary | ICD-10-CM

## 2016-06-09 DIAGNOSIS — R9439 Abnormal result of other cardiovascular function study: Secondary | ICD-10-CM | POA: Diagnosis not present

## 2016-06-09 DIAGNOSIS — R079 Chest pain, unspecified: Secondary | ICD-10-CM | POA: Insufficient documentation

## 2016-06-09 HISTORY — DX: Presence of aortocoronary bypass graft: Z95.1

## 2016-06-09 HISTORY — DX: Disorder of kidney and ureter, unspecified: N28.9

## 2016-06-09 MED ORDER — TECHNETIUM TC 99M TETROFOSMIN IV KIT
13.0000 | PACK | Freq: Once | INTRAVENOUS | Status: AC | PRN
  Administered 2016-06-09: 12.29 via INTRAVENOUS

## 2016-06-09 MED ORDER — TECHNETIUM TC 99M TETROFOSMIN IV KIT
32.3800 | PACK | Freq: Once | INTRAVENOUS | Status: AC | PRN
  Administered 2016-06-09: 32.38 via INTRAVENOUS

## 2016-06-09 MED ORDER — REGADENOSON 0.4 MG/5ML IV SOLN
0.4000 mg | Freq: Once | INTRAVENOUS | Status: AC
  Administered 2016-06-09: 0.4 mg via INTRAVENOUS

## 2016-06-10 LAB — NM MYOCAR MULTI W/SPECT W/WALL MOTION / EF
CHL CUP NUCLEAR SSS: 12
CHL CUP RESTING HR STRESS: 74 {beats}/min
CSEPHR: 48 %
LV dias vol: 244 mL (ref 62–150)
LV sys vol: 108 mL
Peak HR: 79 {beats}/min
SDS: 2
SRS: 11
TID: 1.06

## 2016-06-11 DIAGNOSIS — N186 End stage renal disease: Secondary | ICD-10-CM | POA: Diagnosis not present

## 2016-06-11 DIAGNOSIS — Z992 Dependence on renal dialysis: Secondary | ICD-10-CM | POA: Diagnosis not present

## 2016-06-13 ENCOUNTER — Other Ambulatory Visit: Payer: Medicare Other

## 2016-06-13 IMAGING — US US PARACENTESIS
1 series · 6 of 6 positions shown · non-contrast
Comparison: Prior paracentesis 07/04/2014

CLINICAL DATA: 54-year-old male with cirrhosis and symptomatic
large volume ascites. He presents to the emergency room with
complaints of abdominal pain and shortness of breath. Paracentesis
is requested to facilitate discharged from the emergency room rather
than admission.

EXAM:
ULTRASOUND GUIDED  PARACENTESIS

[Series 1: us paracentesis · 0.27mm/px · 6 of 6 slices shown]
[im 1/6]
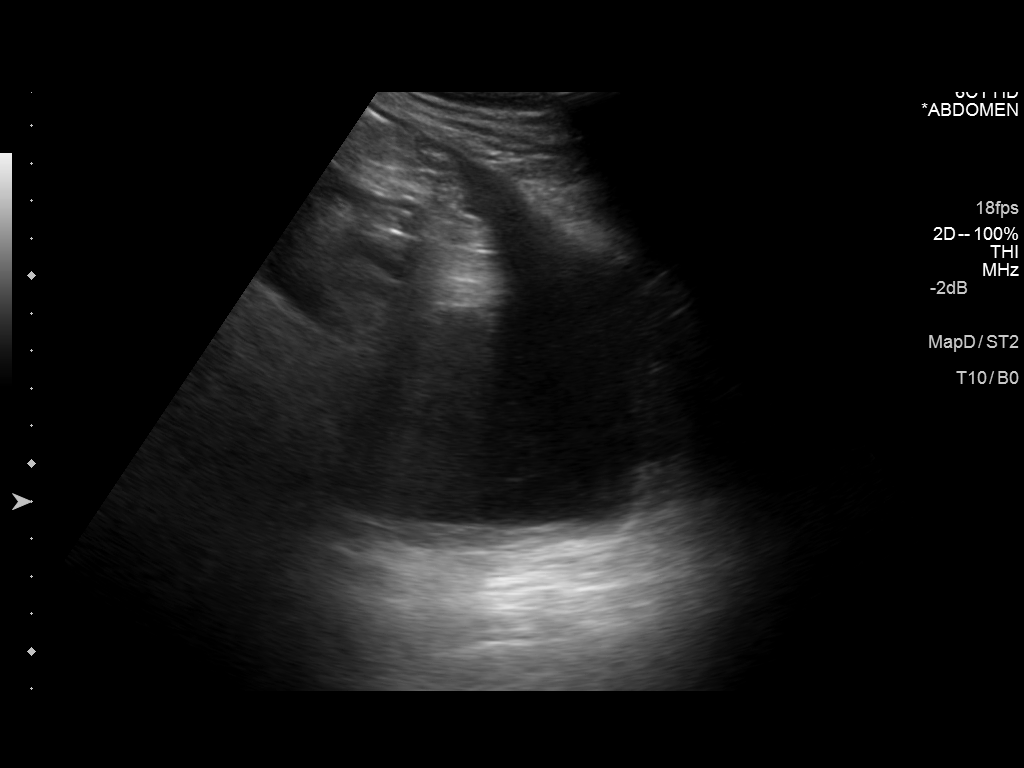
[im 2/6]
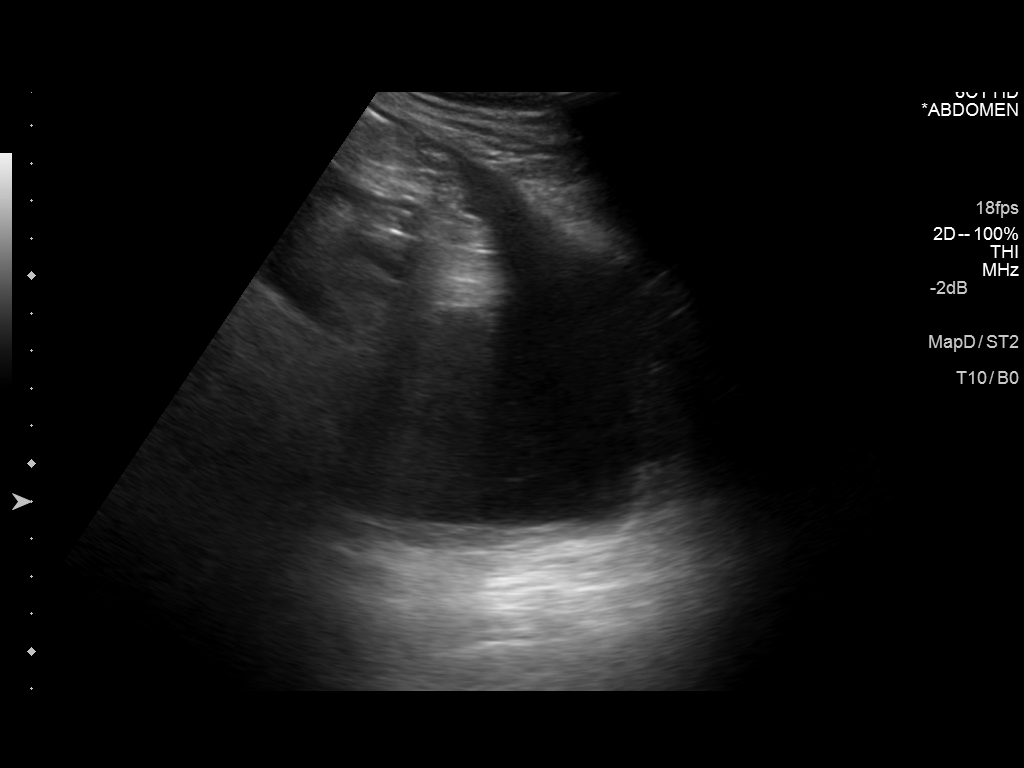
[im 3/6]
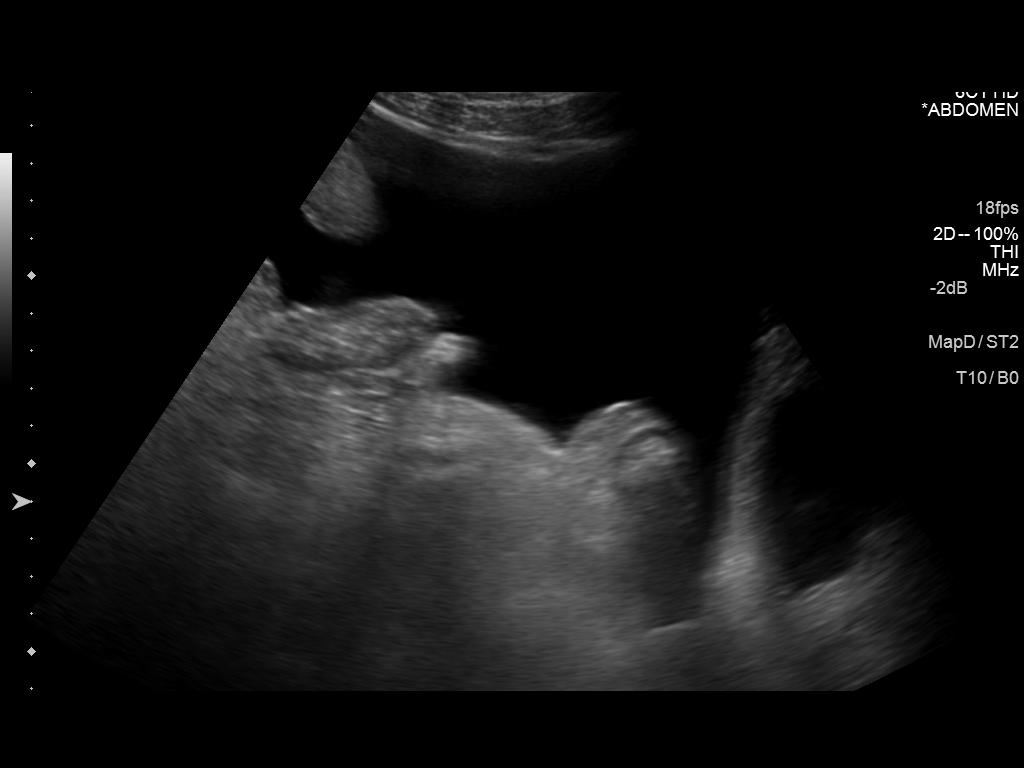
[im 4/6]
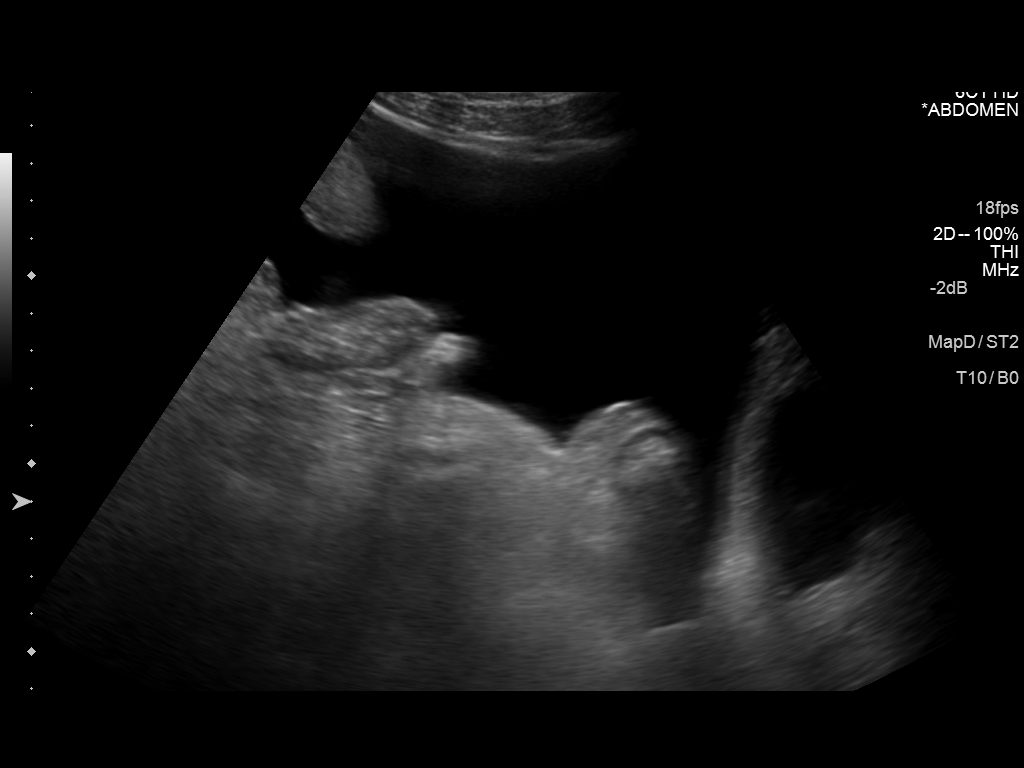
[im 5/6]
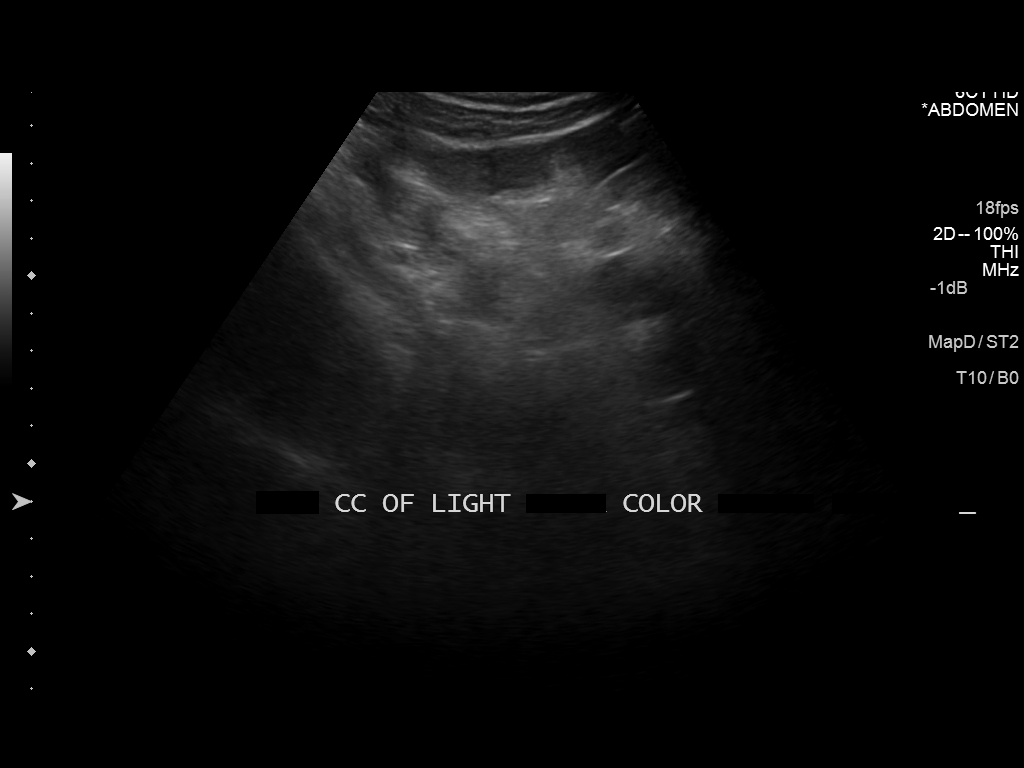
[im 6/6]
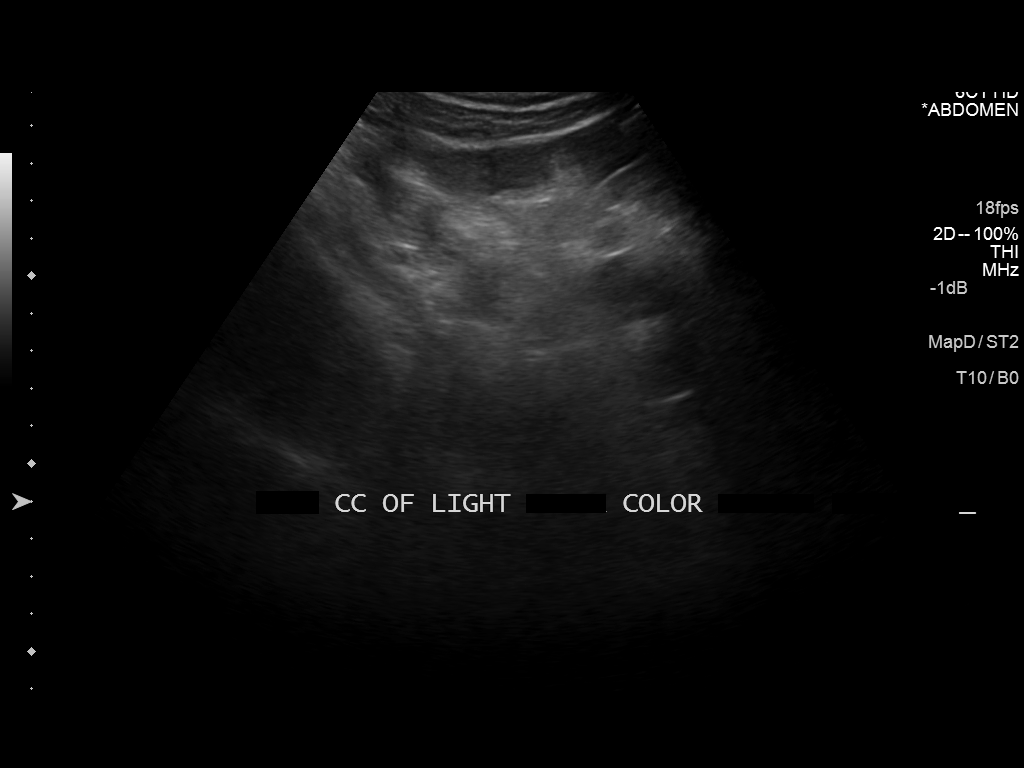

[6 of 6 positions shown; findings below may reference images not displayed]

PROCEDURE:
An ultrasound guided paracentesis was thoroughly discussed with the
patient and questions answered. The benefits, risks, alternatives
and complications were also discussed. The patient understands and
wishes to proceed with the procedure. Written consent was obtained.

Ultrasound was performed to localize and mark an adequate pocket of
fluid in the left lower quadrant of the abdomen. The area was then
prepped and draped in the normal sterile fashion. 1% Lidocaine was
used for local anesthesia. Under ultrasound guidance a 6 French
Safe-T-Centesis catheter was introduced. Paracentesis was performed.
The catheter was removed and a dressing applied.

COMPLICATIONS:
None.
FINDINGS: A total of approximately 3600 mL of light amber colored fluid was
removed. A fluid sample was sent for laboratory analysis.
IMPRESSION: Successful ultrasound guided paracentesis yielding 2.9 L of ascites.

## 2016-06-14 ENCOUNTER — Inpatient Hospital Stay
Admission: EM | Admit: 2016-06-14 | Discharge: 2016-06-19 | DRG: 377 | Disposition: A | Discharge status: Home / Self Care | Payer: Medicare Other | Attending: Internal Medicine | Admitting: Internal Medicine

## 2016-06-14 ENCOUNTER — Encounter: Payer: Self-pay | Admitting: Emergency Medicine

## 2016-06-14 DIAGNOSIS — D123 Benign neoplasm of transverse colon: Secondary | ICD-10-CM | POA: Diagnosis present

## 2016-06-14 DIAGNOSIS — E1151 Type 2 diabetes mellitus with diabetic peripheral angiopathy without gangrene: Secondary | ICD-10-CM | POA: Diagnosis present

## 2016-06-14 DIAGNOSIS — K922 Gastrointestinal hemorrhage, unspecified: Secondary | ICD-10-CM | POA: Diagnosis not present

## 2016-06-14 DIAGNOSIS — N2581 Secondary hyperparathyroidism of renal origin: Secondary | ICD-10-CM | POA: Diagnosis not present

## 2016-06-14 DIAGNOSIS — I120 Hypertensive chronic kidney disease with stage 5 chronic kidney disease or end stage renal disease: Secondary | ICD-10-CM | POA: Diagnosis present

## 2016-06-14 DIAGNOSIS — K625 Hemorrhage of anus and rectum: Secondary | ICD-10-CM | POA: Diagnosis not present

## 2016-06-14 DIAGNOSIS — K644 Residual hemorrhoidal skin tags: Secondary | ICD-10-CM | POA: Diagnosis present

## 2016-06-14 DIAGNOSIS — E875 Hyperkalemia: Secondary | ICD-10-CM | POA: Diagnosis not present

## 2016-06-14 DIAGNOSIS — F17210 Nicotine dependence, cigarettes, uncomplicated: Secondary | ICD-10-CM | POA: Diagnosis present

## 2016-06-14 DIAGNOSIS — F1010 Alcohol abuse, uncomplicated: Secondary | ICD-10-CM | POA: Diagnosis present

## 2016-06-14 DIAGNOSIS — K219 Gastro-esophageal reflux disease without esophagitis: Secondary | ICD-10-CM | POA: Diagnosis present

## 2016-06-14 DIAGNOSIS — K7031 Alcoholic cirrhosis of liver with ascites: Secondary | ICD-10-CM | POA: Diagnosis present

## 2016-06-14 DIAGNOSIS — I2510 Atherosclerotic heart disease of native coronary artery without angina pectoris: Secondary | ICD-10-CM | POA: Diagnosis present

## 2016-06-14 DIAGNOSIS — K449 Diaphragmatic hernia without obstruction or gangrene: Secondary | ICD-10-CM | POA: Diagnosis present

## 2016-06-14 DIAGNOSIS — Z951 Presence of aortocoronary bypass graft: Secondary | ICD-10-CM | POA: Diagnosis not present

## 2016-06-14 DIAGNOSIS — Z9115 Patient's noncompliance with renal dialysis: Secondary | ICD-10-CM

## 2016-06-14 DIAGNOSIS — K5730 Diverticulosis of large intestine without perforation or abscess without bleeding: Secondary | ICD-10-CM | POA: Diagnosis present

## 2016-06-14 DIAGNOSIS — D649 Anemia, unspecified: Secondary | ICD-10-CM | POA: Diagnosis not present

## 2016-06-14 DIAGNOSIS — N186 End stage renal disease: Secondary | ICD-10-CM | POA: Diagnosis not present

## 2016-06-14 DIAGNOSIS — I10 Essential (primary) hypertension: Secondary | ICD-10-CM | POA: Diagnosis not present

## 2016-06-14 DIAGNOSIS — E785 Hyperlipidemia, unspecified: Secondary | ICD-10-CM | POA: Diagnosis present

## 2016-06-14 DIAGNOSIS — E1142 Type 2 diabetes mellitus with diabetic polyneuropathy: Secondary | ICD-10-CM | POA: Diagnosis present

## 2016-06-14 DIAGNOSIS — D62 Acute posthemorrhagic anemia: Secondary | ICD-10-CM | POA: Diagnosis present

## 2016-06-14 DIAGNOSIS — I252 Old myocardial infarction: Secondary | ICD-10-CM

## 2016-06-14 DIAGNOSIS — Z992 Dependence on renal dialysis: Secondary | ICD-10-CM | POA: Diagnosis not present

## 2016-06-14 DIAGNOSIS — Z79899 Other long term (current) drug therapy: Secondary | ICD-10-CM | POA: Diagnosis not present

## 2016-06-14 DIAGNOSIS — E1122 Type 2 diabetes mellitus with diabetic chronic kidney disease: Secondary | ICD-10-CM | POA: Diagnosis present

## 2016-06-14 DIAGNOSIS — K648 Other hemorrhoids: Secondary | ICD-10-CM | POA: Diagnosis present

## 2016-06-14 DIAGNOSIS — K921 Melena: Secondary | ICD-10-CM | POA: Diagnosis not present

## 2016-06-14 DIAGNOSIS — Z7982 Long term (current) use of aspirin: Secondary | ICD-10-CM

## 2016-06-14 DIAGNOSIS — D631 Anemia in chronic kidney disease: Secondary | ICD-10-CM | POA: Diagnosis present

## 2016-06-14 DIAGNOSIS — K2970 Gastritis, unspecified, without bleeding: Secondary | ICD-10-CM | POA: Diagnosis not present

## 2016-06-14 DIAGNOSIS — D500 Iron deficiency anemia secondary to blood loss (chronic): Secondary | ICD-10-CM | POA: Diagnosis not present

## 2016-06-14 LAB — CBC WITH DIFFERENTIAL/PLATELET
BASOS ABS: 0.1 10*3/uL (ref 0–0.1)
BASOS PCT: 2 %
EOS ABS: 0.2 10*3/uL (ref 0–0.7)
EOS PCT: 2 %
HCT: 18.6 % — ABNORMAL LOW (ref 40.0–52.0)
Hemoglobin: 5.8 g/dL — ABNORMAL LOW (ref 13.0–18.0)
LYMPHS ABS: 0.7 10*3/uL — AB (ref 1.0–3.6)
Lymphocytes Relative: 9 %
MCH: 26.6 pg (ref 26.0–34.0)
MCHC: 31.3 g/dL — ABNORMAL LOW (ref 32.0–36.0)
MCV: 84.8 fL (ref 80.0–100.0)
Monocytes Absolute: 0.8 10*3/uL (ref 0.2–1.0)
Monocytes Relative: 9 %
NEUTROS PCT: 78 %
Neutro Abs: 6.5 10*3/uL (ref 1.4–6.5)
PLATELETS: 343 10*3/uL (ref 150–440)
RBC: 2.19 MIL/uL — ABNORMAL LOW (ref 4.40–5.90)
RDW: 19 % — ABNORMAL HIGH (ref 11.5–14.5)
WBC: 8.4 10*3/uL (ref 3.8–10.6)

## 2016-06-14 LAB — BASIC METABOLIC PANEL
Anion gap: 15 (ref 5–15)
BUN: 100 mg/dL — AB (ref 6–20)
CO2: 20 mmol/L — ABNORMAL LOW (ref 22–32)
Calcium: 8.5 mg/dL — ABNORMAL LOW (ref 8.9–10.3)
Chloride: 103 mmol/L (ref 101–111)
Creatinine, Ser: 11.27 mg/dL — ABNORMAL HIGH (ref 0.61–1.24)
GFR, EST AFRICAN AMERICAN: 5 mL/min — AB (ref >60)
GFR, EST NON AFRICAN AMERICAN: 4 mL/min — AB (ref >60)
Glucose, Bld: 121 mg/dL — ABNORMAL HIGH (ref 65–99)
Potassium: 5.6 mmol/L — ABNORMAL HIGH (ref 3.5–5.1)
SODIUM: 138 mmol/L (ref 135–145)

## 2016-06-14 LAB — HEPATIC FUNCTION PANEL
ALBUMIN: 3.2 g/dL — AB (ref 3.5–5.0)
ALT: 20 U/L (ref 17–63)
AST: 28 U/L (ref 15–41)
Alkaline Phosphatase: 147 U/L — ABNORMAL HIGH (ref 38–126)
Bilirubin, Direct: 0.1 mg/dL (ref 0.1–0.5)
Indirect Bilirubin: 0.7 mg/dL (ref 0.3–0.9)
TOTAL PROTEIN: 7.3 g/dL (ref 6.5–8.1)
Total Bilirubin: 0.8 mg/dL (ref 0.3–1.2)

## 2016-06-14 LAB — GLUCOSE, CAPILLARY: GLUCOSE-CAPILLARY: 135 mg/dL — AB (ref 65–99)

## 2016-06-14 LAB — PROTIME-INR
INR: 1.17
PROTHROMBIN TIME: 15 s (ref 11.4–15.2)

## 2016-06-14 LAB — PHOSPHORUS: Phosphorus: 3.5 mg/dL (ref 2.5–4.6)

## 2016-06-14 MED ORDER — SODIUM CHLORIDE 0.9% FLUSH
3.0000 mL | INTRAVENOUS | Status: DC | PRN
  Filled 2016-06-14: qty 3

## 2016-06-14 MED ORDER — SODIUM CHLORIDE 0.9% FLUSH
3.0000 mL | Freq: Two times a day (BID) | INTRAVENOUS | Status: DC
  Administered 2016-06-14 – 2016-06-19 (×11): 3 mL via INTRAVENOUS

## 2016-06-14 MED ORDER — LIDOCAINE HCL (PF) 1 % IJ SOLN
5.0000 mL | INTRAMUSCULAR | Status: DC | PRN
  Filled 2016-06-14: qty 5

## 2016-06-14 MED ORDER — SODIUM CHLORIDE 0.9% FLUSH
3.0000 mL | Freq: Two times a day (BID) | INTRAVENOUS | Status: DC
  Administered 2016-06-15 – 2016-06-19 (×8): 3 mL via INTRAVENOUS

## 2016-06-14 MED ORDER — PANTOPRAZOLE SODIUM 40 MG IV SOLR
40.0000 mg | Freq: Two times a day (BID) | INTRAVENOUS | Status: DC
Start: 2016-06-18 — End: 2016-06-17

## 2016-06-14 MED ORDER — ONDANSETRON HCL 4 MG PO TABS: 4.0000 mg | ORAL_TABLET | Freq: Four times a day (QID) | ORAL | Status: DC | PRN

## 2016-06-14 MED ORDER — ALTEPLASE 2 MG IJ SOLR: 2.0000 mg | Freq: Once | INTRAMUSCULAR | Status: DC | PRN

## 2016-06-14 MED ORDER — INSULIN ASPART 100 UNIT/ML ~~LOC~~ SOLN
0.0000 [IU] | Freq: Three times a day (TID) | SUBCUTANEOUS | Status: DC
  Administered 2016-06-15: 1 [IU] via SUBCUTANEOUS
  Filled 2016-06-14: qty 1

## 2016-06-14 MED ORDER — HYDROCODONE-ACETAMINOPHEN 5-325 MG PO TABS
1.0000 | ORAL_TABLET | ORAL | Status: DC | PRN
  Administered 2016-06-15 (×3): 2 via ORAL
  Administered 2016-06-16: 1 via ORAL
  Administered 2016-06-17 (×3): 2 via ORAL
  Filled 2016-06-14 (×5): qty 2
  Filled 2016-06-14: qty 1
  Filled 2016-06-14 (×2): qty 2

## 2016-06-14 MED ORDER — ALBUTEROL SULFATE (2.5 MG/3ML) 0.083% IN NEBU
2.5000 mg | INHALATION_SOLUTION | RESPIRATORY_TRACT | Status: DC | PRN
  Administered 2016-06-15: 2.5 mg via RESPIRATORY_TRACT
  Filled 2016-06-14: qty 3

## 2016-06-14 MED ORDER — SODIUM CHLORIDE 0.9 % IV SOLN
Freq: Once | INTRAVENOUS | Status: AC
  Administered 2016-06-14: 13:00:00 via INTRAVENOUS

## 2016-06-14 MED ORDER — ACETAMINOPHEN 325 MG PO TABS
650.0000 mg | ORAL_TABLET | Freq: Four times a day (QID) | ORAL | Status: DC | PRN
  Administered 2016-06-16: 650 mg via ORAL
  Filled 2016-06-14 (×2): qty 2

## 2016-06-14 MED ORDER — SODIUM CHLORIDE 0.9 % IV SOLN: 100.0000 mL | INTRAVENOUS | Status: DC | PRN

## 2016-06-14 MED ORDER — ONDANSETRON HCL 4 MG/2ML IJ SOLN: 4.0000 mg | Freq: Four times a day (QID) | INTRAMUSCULAR | Status: DC | PRN

## 2016-06-14 MED ORDER — SODIUM CHLORIDE 0.9 % IV SOLN
1.0000 g | Freq: Once | INTRAVENOUS | Status: DC
  Filled 2016-06-14: qty 10

## 2016-06-14 MED ORDER — SODIUM CHLORIDE 0.9 % IV SOLN
250.0000 mL | INTRAVENOUS | Status: DC | PRN
  Administered 2016-06-15: 250 mL via INTRAVENOUS

## 2016-06-14 MED ORDER — SODIUM CHLORIDE 0.9 % IV SOLN
Freq: Once | INTRAVENOUS | Status: AC
  Administered 2016-06-14: 14:00:00 via INTRAVENOUS
  Filled 2016-06-14: qty 100

## 2016-06-14 MED ORDER — HEPARIN SODIUM (PORCINE) 1000 UNIT/ML DIALYSIS
1000.0000 [IU] | INTRAMUSCULAR | Status: DC | PRN
  Filled 2016-06-14: qty 1

## 2016-06-14 MED ORDER — CALCIUM GLUCONATE 10 % IV SOLN
INTRAVENOUS | Status: AC
  Filled 2016-06-14: qty 10

## 2016-06-14 MED ORDER — ACETAMINOPHEN 650 MG RE SUPP: 650.0000 mg | Freq: Four times a day (QID) | RECTAL | Status: DC | PRN

## 2016-06-14 MED ORDER — PENTAFLUOROPROP-TETRAFLUOROETH EX AERO: 1.0000 "application " | INHALATION_SPRAY | CUTANEOUS | Status: DC | PRN

## 2016-06-14 MED ORDER — LIDOCAINE-PRILOCAINE 2.5-2.5 % EX CREA
1.0000 | TOPICAL_CREAM | CUTANEOUS | Status: DC | PRN
Start: 2016-06-14 — End: 2016-06-15

## 2016-06-14 MED ORDER — SODIUM CHLORIDE 0.9 % IV SOLN
80.0000 mg | Freq: Once | INTRAVENOUS | Status: AC
  Administered 2016-06-14: 80 mg via INTRAVENOUS
  Filled 2016-06-14: qty 80

## 2016-06-14 MED ORDER — SODIUM CHLORIDE 0.9 % IV SOLN
8.0000 mg/h | INTRAVENOUS | Status: DC
  Administered 2016-06-15 (×2): 8 mg/h via INTRAVENOUS
  Filled 2016-06-14 (×5): qty 80

## 2016-06-14 NOTE — Progress Notes (Signed)
HD TX started  

## 2016-06-14 NOTE — Progress Notes (Signed)
HD TX ended  

## 2016-06-14 NOTE — Consult Note (Signed)
Stanley Bellows MD  [address] [place], [place] [address] Phone: [phone] Fax : [phone]  Consultation  Referring Provider:    Dr Darvin Neighbours Primary Care Physician:  Stanley Jarvis, MD Primary Gastroenterologist:  Dr.  Candace Cruise        Reason for Consultation:     Dark stools   Date of Admission:  06/14/2016 Date of Consultation:  06/14/2016         HPI:   Stanley [name] is a 56 y.o. male with a history of alcoholic cirrhosis , ascites who used to see Dr Candace Cruise in the past . He also has ESRD. EGD in 2017 showed angiodysplasia and he has been treated with APC. Capsule study was performed in 04/2016 which showed a small AVM in the proximal jejunum which was subsequently ablated in 04/2016.He came into the hospital with bright red blood per rectum .  Last colonoscopy was back in 2016 with a withdrawal time of 3 minutes by Dr Candace Cruise which showed a small polyp. Platelet count normal .   He says he was doing well when yesterday he had all of a sudden multiple bowel movements with a lot of dark blood mixed in it, none after. No abdominal pain, hematemesis, no use of NSAID's, he has been non compliant with his dialysis.    Past Medical History:  Diagnosis Date  . Alcohol abuse   . Cirrhosis ([place])   . Coronary artery disease 2009  . Diabetic peripheral neuropathy ([place])   . Drug abuse   . End stage renal disease on dialysis [facility]) NEPHROLOGIST-   DR [facility]  IN [place]   HEMODIALYSIS --   TUES/  THURS/  SAT  . GERD (gastroesophageal reflux disease)   . Hyperlipidemia   . Hypertension   . PAD (peripheral artery disease) ([place])   . Renal insufficiency    Per pt, 32 oz fluid restriction per day  . S/P triple vessel bypass 06/09/2016   2009ish  . Suicidal ideation    & HOMICIDAL IDEATION --  06-16-2013   ADMITTED TO BEHAVIOR HEALTH    Past Surgical History:  Procedure Laterality Date  . CORONARY ANGIOPLASTY  ?   PT UNABLE TO TELL IF  BEFORE OR AFTER  CABG  . CORONARY ARTERY BYPASS GRAFT  2008   ([place] , [employer])   3 VESSEL  . DIALYSIS FISTULA CREATION  LAST SURGERY  APPOX  2008  . ENTEROSCOPY N/A 05/10/2016   Procedure: ENTEROSCOPY;  Surgeon: Jerene Bears, MD;  Location: [place];  Service: Gastroenterology;  Laterality: N/A;  . ESOPHAGOGASTRODUODENOSCOPY N/A 05/07/2015   Procedure: ESOPHAGOGASTRODUODENOSCOPY (EGD);  Surgeon: Hulen Luster, MD;  Location: [facility] ENDOSCOPY;  Service: Endoscopy;  Laterality: N/A;  . ESOPHAGOGASTRODUODENOSCOPY (EGD) WITH PROPOFOL N/A 05/17/2015   Procedure: ESOPHAGOGASTRODUODENOSCOPY (EGD) WITH PROPOFOL;  Surgeon: [name], MD;  Location: [facility] ENDOSCOPY;  Service: Endoscopy;  Laterality: N/A;  . ESOPHAGOGASTRODUODENOSCOPY (EGD) WITH PROPOFOL N/A 01/20/2016   Procedure: ESOPHAGOGASTRODUODENOSCOPY (EGD) WITH PROPOFOL;  Surgeon: Stanley Bellows, MD;  Location: [facility] ENDOSCOPY;  Service: Endoscopy;  Laterality: N/A;  . ESOPHAGOGASTRODUODENOSCOPY (EGD) WITH PROPOFOL N/A 04/17/2016   Procedure: ESOPHAGOGASTRODUODENOSCOPY (EGD) WITH PROPOFOL;  Surgeon: Lin Landsman, MD;  Location: [facility] ENDOSCOPY;  Service: Gastroenterology;  Laterality: N/A;  . ESOPHAGOGASTRODUODENOSCOPY (EGD) WITH PROPOFOL  05/09/2016   Procedure: ESOPHAGOGASTRODUODENOSCOPY (EGD) WITH PROPOFOL;  Surgeon: Jerene Bears, MD;  Location: [place];  Service: Endoscopy;;  . GIVENS CAPSULE STUDY N/A 05/07/2016   Procedure: GIVENS CAPSULE STUDY;  Surgeon: Estill Cotta  Dorothea Glassman, MD;  Location: [place];  Service: Endoscopy;  Laterality: N/A;  . MANDIBULAR HARDWARE REMOVAL N/A 07/29/2013   Procedure: REMOVAL OF ARCH BARS;  Surgeon: Theodoro Kos, DO;  Location: [place];  Service: Plastics;  Laterality: N/A;  . ORIF MANDIBULAR FRACTURE N/A 06/05/2013   Procedure: REPAIR OF MANDIBULAR FRACTURE x 2 with maxillo-mandibular fixation ;  Surgeon: Theodoro Kos, DO;  Location: [place];  Service: Plastics;  Laterality: N/A;  . PERIPHERAL ARTERIAL STENT GRAFT Left     Prior to Admission medications     Medication Sig Start Date End Date Taking? Authorizing Provider  atorvastatin (LIPITOR) 40 MG tablet Take 40 mg by mouth daily. 04/19/16  Yes Historical Provider, MD  budesonide-formoterol (SYMBICORT) 160-4.5 MCG/ACT inhaler Inhale 2 puffs into the lungs daily.   Yes Historical Provider, MD  furosemide (LASIX) 80 MG tablet Take 80 mg by mouth daily. 05/11/16  Yes Historical Provider, MD  gabapentin (NEURONTIN) 300 MG capsule Take 1 capsule (300 mg total) by mouth daily. Patient taking differently: Take 300 mg by mouth at bedtime.  03/27/15  Yes Bettey Costa, MD  Multiple Vitamins-Minerals-FA (DIALYVITE SUPREME D) 3 MG TABS Take 1 tablet by mouth daily. 05/27/16  Yes Historical Provider, MD  mupirocin ointment (BACTROBAN) 2 % Place 1 application into the nose 2 (two) times daily. 06/01/16  Yes Theodoro Grist, MD  naproxen (NAPROSYN) 500 MG tablet Take 500 mg by mouth 2 (two) times daily as needed. for pain 04/28/16  Yes Historical Provider, MD  nitroGLYCERIN (NITROSTAT) 0.4 MG SL tablet Place 1 tablet (0.4 mg total) under the tongue every 5 (five) minutes as needed. Patient taking differently: Place 0.4 mg under the tongue every 5 (five) minutes as needed for chest pain.  04/12/16  Yes Wende Bushy, MD  pantoprazole (PROTONIX) 40 MG tablet Take 1 tablet (40 mg total) by mouth daily. 05/10/16  Yes Shanker Kristeen Mans, MD  sevelamer carbonate (RENVELA) 800 MG tablet Take 3 tablets (2,400 mg total) by mouth 3 (three) times daily with meals. 06/01/16  Yes Theodoro Grist, MD  amLODipine (NORVASC) 5 MG tablet Take 5 mg by mouth daily.    Historical Provider, MD  b complex-vitamin c-folic acid (NEPHRO-VITE) 0.8 MG TABS tablet Take 1 tablet by mouth daily.    Historical Provider, MD  hydrOXYzine (VISTARIL) 25 MG capsule Take 1 capsule (25 mg total) by mouth 3 (three) times daily as needed for itching. Patient not taking: Reported on 05/06/2016 02/25/16   Jami L Hagler, PA-C  labetalol (NORMODYNE) 100 MG tablet Take 1  tablet (100 mg total) by mouth daily. Patient not taking: Reported on 06/14/2016 01/22/16   Epifanio Lesches, MD  oxyCODONE-acetaminophen (ROXICET) 5-325 MG tablet Take 1-2 tablets by mouth every 4 (four) hours as needed for severe pain. Patient not taking: Reported on 06/14/2016 04/21/16   Hinda Kehr, MD    Family History  Problem Relation Age of Onset  . Colon cancer Mother   . Cancer Father   . Cancer Sister      Social History  Substance Use Topics  . Smoking status: Current Every Day Smoker    Packs/day: 0.15    Years: 40.00    Types: Cigarettes  . Smokeless tobacco: Never Used  . Alcohol use No     Comment: pt reports quitting after learning about cirrhosis    Allergies as of 06/14/2016  . (No Known Allergies)    Review of Systems:    All systems reviewed  and negative except where noted in HPI.   Physical Exam:  Vital signs in last 24 hours: Temp:  [98.3 F (36.8 C)-98.4 F (36.9 C)] 98.4 F (36.9 C) (04/03 1600) Pulse Rate:  [82-93] 88 (04/03 1730) Resp:  [13-18] 18 (04/03 1730) BP: (126-143)/(71-99) 140/86 (04/03 1630) SpO2:  [98 %-100 %] 100 % (04/03 1600) Weight:  [185 lb (83.9 kg)] 185 lb (83.9 kg) (04/03 1215)   General:   Pleasant, cooperative in NAD Head:  Normocephalic and atraumatic. Eyes:   No icterus.   Conjunctiva pink. PERRLA. Ears:  Normal auditory acuity. Neck:  Supple; no masses or thyroidomegaly Lungs: Respirations even and unlabored. Lungs clear to auscultation bilaterally.   No wheezes, crackles, or rhonchi.  Heart:  Regular rate and rhythm;  Without murmur, clicks, rubs or gallops Abdomen:  Soft, nondistended, nontender. Normal bowel sounds. No appreciable masses or hepatomegaly.  No rebound or guarding.  Rectal:  Not performed. Neurologic:  Alert and oriented x3;  grossly normal neurologically. Skin:  Intact without significant lesions or rashes. Cervical Nodes:  No significant cervical adenopathy. Psych:  Alert and cooperative. Normal  affect.  LAB RESULTS:  Recent Labs  06/14/16 1228  WBC 8.4  HGB 5.8*  HCT 18.6*  PLT 343   BMET  Recent Labs  06/14/16 1228  NA 138  K 5.6*  CL 103  CO2 20*  GLUCOSE 121*  BUN 100*  CREATININE 11.27*  CALCIUM 8.5*   LFT  Recent Labs  06/14/16 1228  PROT 7.3  ALBUMIN 3.2*  AST 28  ALT 20  ALKPHOS 147*  BILITOT 0.8  BILIDIR 0.1  IBILI 0.7  c PT/INR  Recent Labs  06/14/16 1228  LABPROT 15.0  INR 1.17    STUDIES: No results found.    Impression / Plan:   Lamarco Gudiel is a 56 y.o. y/o male with a history of overt obscure GI bleeding , prior small avm of the proximal jejunum comes in with rectal bleeding . Unclear if it is an upper or lower GI bleed , In terms of his "cirrhosis diagnosis ", he has a normal albumin , normal platelet count does not really suggest cirrhosis. Ascites may be from other causes which is probably not relevant at this time. He follows at [facility].   Plan  1. Transfuse and monitor CBC 2. His colonoscopy was back in 2016 by Dr Candace Cruise  and the withdrawal time was only 3 minutes which is a very rapid procedure and more likely to miss lesions , I will plan for an EGD tomorrow if possible based on the schedule and if EGD is negative will need a colonoscopy too. If he bleeds in the interim get tagged RBC scan .   Thank you for involving me in the care of this patient.      LOS: 0 days   Stanley Bellows, MD  06/14/2016, 5:54 PM

## 2016-06-14 NOTE — ED Notes (Signed)
Pharmacy called and notified of need of calcium gluconate. States they will send it up asap.

## 2016-06-14 NOTE — Progress Notes (Signed)
PRE HD   

## 2016-06-14 NOTE — ED Provider Notes (Signed)
[facility] Emergency Department Provider Note    First MD Initiated Contact with Patient 06/14/16 1213     (approximate)  I have reviewed the triage vital signs and the nursing notes.   HISTORY  Chief Complaint Rectal Bleeding    HPI Stanley [name] is a 56 y.o. male history of end-stage renal disease as well as alcoholic cirrhosis with chronic upper GI bleed status post endoscopy in February of last month presents with recurrent of maroon-colored stool. Patient has a long history of being noncompliant. States his last dialysis treatment was on Tuesday of last week. Presents to the ER today stating "aint nobody can figure me out, been here 10 times and still having this bleeding."  Patient has been missing dialysis for the past 3 sessions though it's difficult to to understand why he is not going. States that he had change in dialysis Center. Denies any fevers. No hematemesis. States that the bleeding is been ongoing for the past several days and has been having some more bleeding for over a year now. Has not been taking his Protonix. States his last drink was over a year ago. Denies any shortness of breath or chest pain.  ENDOSCOPY 05/09/16 - Monilial esophagitis. - Mild, non-bleeding, antral gastritis. - Normal examined duodenum. - A single non-bleeding angiodysplastic lesion in the jejunum. Treated with argon plasma coagulation (APC). - No specimens collected  Past Medical History:  Diagnosis Date  . Alcohol abuse   . Cirrhosis ([place])   . Coronary artery disease 2009  . Diabetic peripheral neuropathy ([place])   . Drug abuse   . End stage renal disease on dialysis [facility]) NEPHROLOGIST-   DR [facility]  IN [place]   HEMODIALYSIS --   TUES/  THURS/  SAT  . GERD (gastroesophageal reflux disease)   . Hyperlipidemia   . Hypertension   . PAD (peripheral artery disease) ([place])   . Renal insufficiency    Per pt, 32 oz fluid restriction per day  . S/P triple  vessel bypass 06/09/2016   2009ish  . Suicidal ideation    & HOMICIDAL IDEATION --  06-16-2013   ADMITTED TO BEHAVIOR HEALTH   Family History  Problem Relation Age of Onset  . Colon cancer Mother   . Cancer Father   . Cancer Sister    Past Surgical History:  Procedure Laterality Date  . CORONARY ANGIOPLASTY  ?   PT UNABLE TO TELL IF  BEFORE OR AFTER  CABG  . CORONARY ARTERY BYPASS GRAFT  2008  ([place] , [place])   3 VESSEL  . DIALYSIS FISTULA CREATION  LAST SURGERY  APPOX  2008  . ENTEROSCOPY N/A 05/10/2016   Procedure: ENTEROSCOPY;  Surgeon: Jerene Bears, MD;  Location: [place];  Service: Gastroenterology;  Laterality: N/A;  . ESOPHAGOGASTRODUODENOSCOPY N/A 05/07/2015   Procedure: ESOPHAGOGASTRODUODENOSCOPY (EGD);  Surgeon: Hulen Luster, MD;  Location: [facility] ENDOSCOPY;  Service: Endoscopy;  Laterality: N/A;  . ESOPHAGOGASTRODUODENOSCOPY (EGD) WITH PROPOFOL N/A 05/17/2015   Procedure: ESOPHAGOGASTRODUODENOSCOPY (EGD) WITH PROPOFOL;  Surgeon: [name], MD;  Location: [facility] ENDOSCOPY;  Service: Endoscopy;  Laterality: N/A;  . ESOPHAGOGASTRODUODENOSCOPY (EGD) WITH PROPOFOL N/A 01/20/2016   Procedure: ESOPHAGOGASTRODUODENOSCOPY (EGD) WITH PROPOFOL;  Surgeon: [name], MD;  Location: [facility] ENDOSCOPY;  Service: Endoscopy;  Laterality: N/A;  . ESOPHAGOGASTRODUODENOSCOPY (EGD) WITH PROPOFOL N/A 04/17/2016   Procedure: ESOPHAGOGASTRODUODENOSCOPY (EGD) WITH PROPOFOL;  Surgeon: Lin Landsman, MD;  Location: [facility] ENDOSCOPY;  Service: Gastroenterology;  Laterality: N/A;  . ESOPHAGOGASTRODUODENOSCOPY (  EGD) WITH PROPOFOL  05/09/2016   Procedure: ESOPHAGOGASTRODUODENOSCOPY (EGD) WITH PROPOFOL;  Surgeon: Jerene Bears, MD;  Location: [place];  Service: Endoscopy;;  . GIVENS CAPSULE STUDY N/A 05/07/2016   Procedure: GIVENS CAPSULE STUDY;  Surgeon: Doran Stabler, MD;  Location: [place];  Service: Endoscopy;  Laterality: N/A;  . MANDIBULAR HARDWARE REMOVAL N/A 07/29/2013   Procedure: REMOVAL OF  ARCH BARS;  Surgeon: Theodoro Kos, DO;  Location: [place];  Service: Plastics;  Laterality: N/A;  . ORIF MANDIBULAR FRACTURE N/A 06/05/2013   Procedure: REPAIR OF MANDIBULAR FRACTURE x 2 with maxillo-mandibular fixation ;  Surgeon: Theodoro Kos, DO;  Location: [place];  Service: Plastics;  Laterality: N/A;  . PERIPHERAL ARTERIAL STENT GRAFT Left    Patient Active Problem List   Diagnosis Date Noted  . Anemia of chronic disease 06/01/2016  . MRSA carrier 06/01/2016  . Chronic renal failure 05/23/2016  . Ischemic heart disease 05/23/2016  . Angiodysplasia of small intestine   . Melena   . Small bowel bleed not requiring more than 4 units of blood in 24 hours, ICU, or surgery   . Anemia due to chronic blood loss   . Abdominal pain 05/05/2016  . Acute posthemorrhagic anemia 04/17/2016  . Gastrointestinal bleed 04/17/2016  . History of esophagogastroduodenoscopy (EGD) 04/17/2016  . Elevated troponin 04/17/2016  . Alcohol abuse 04/17/2016  . Upper GI bleed 01/19/2016  . Blood in stool   . Angiodysplasia of stomach and duodenum with hemorrhage   . Gastritis   . Esophagitis, unspecified   . GI bleed 05/16/2015  . Acute GI bleeding   . Symptomatic anemia 04/30/2015  . HTN (hypertension) 04/06/2015  . GERD (gastroesophageal reflux disease) 04/06/2015  . HLD (hyperlipidemia) 04/06/2015  . Dyspnea 04/06/2015  . Cirrhosis of liver with ascites ([place]) 04/06/2015  . Ascites 04/06/2015  . GIB (gastrointestinal bleeding) 03/23/2015  . Homicidal ideation 06/19/2013  . Suicidal intent 06/19/2013  . Homicidal ideations 06/19/2013  . Hyperkalemia 06/16/2013  . Mandible fracture ([place]) 06/05/2013  . Fracture, mandible ([place]) 06/02/2013  . Coronary atherosclerosis of native coronary artery 06/02/2013  . ESRD on dialysis ([place]) 06/02/2013  . Mandible open fracture ([place]) 06/02/2013      Prior to Admission medications   Medication Sig Start Date End Date Taking? Authorizing  Provider  atorvastatin (LIPITOR) 40 MG tablet Take 40 mg by mouth daily. 04/19/16  Yes Historical Provider, MD  budesonide-formoterol (SYMBICORT) 160-4.5 MCG/ACT inhaler Inhale 2 puffs into the lungs daily.   Yes Historical Provider, MD  furosemide (LASIX) 80 MG tablet Take 80 mg by mouth daily. 05/11/16  Yes Historical Provider, MD  gabapentin (NEURONTIN) 300 MG capsule Take 1 capsule (300 mg total) by mouth daily. Patient taking differently: Take 300 mg by mouth at bedtime.  03/27/15  Yes Bettey Costa, MD  Multiple Vitamins-Minerals-FA (DIALYVITE SUPREME D) 3 MG TABS Take 1 tablet by mouth daily. 05/27/16  Yes Historical Provider, MD  mupirocin ointment (BACTROBAN) 2 % Place 1 application into the nose 2 (two) times daily. 06/01/16  Yes Theodoro Grist, MD  naproxen (NAPROSYN) 500 MG tablet Take 500 mg by mouth 2 (two) times daily as needed. for pain 04/28/16  Yes Historical Provider, MD  nitroGLYCERIN (NITROSTAT) 0.4 MG SL tablet Place 1 tablet (0.4 mg total) under the tongue every 5 (five) minutes as needed. Patient taking differently: Place 0.4 mg under the tongue every 5 (five) minutes as needed for chest pain.  04/12/16  Yes Aileen  Ingal, MD  pantoprazole (PROTONIX) 40 MG tablet Take 1 tablet (40 mg total) by mouth daily. 05/10/16  Yes Shanker Kristeen Mans, MD  sevelamer carbonate (RENVELA) 800 MG tablet Take 3 tablets (2,400 mg total) by mouth 3 (three) times daily with meals. 06/01/16  Yes Theodoro Grist, MD  amLODipine (NORVASC) 5 MG tablet Take 5 mg by mouth daily.    Historical Provider, MD  b complex-vitamin c-folic acid (NEPHRO-VITE) 0.8 MG TABS tablet Take 1 tablet by mouth daily.    Historical Provider, MD  hydrOXYzine (VISTARIL) 25 MG capsule Take 1 capsule (25 mg total) by mouth 3 (three) times daily as needed for itching. Patient not taking: Reported on 05/06/2016 02/25/16   Jami L Hagler, PA-C  labetalol (NORMODYNE) 100 MG tablet Take 1 tablet (100 mg total) by mouth daily. Patient not taking:  Reported on 06/14/2016 01/22/16   Epifanio Lesches, MD  oxyCODONE-acetaminophen (ROXICET) 5-325 MG tablet Take 1-2 tablets by mouth every 4 (four) hours as needed for severe pain. Patient not taking: Reported on 06/14/2016 04/21/16   Hinda Kehr, MD    Allergies Patient has no known allergies.    Social History Social History  Substance Use Topics  . Smoking status: Current Every Day Smoker    Packs/day: 0.15    Years: 40.00    Types: Cigarettes  . Smokeless tobacco: Never Used  . Alcohol use No     Comment: pt reports quitting after learning about cirrhosis    Review of Systems Patient denies headaches, rhinorrhea, blurry vision, numbness, shortness of breath, chest pain, edema, cough, abdominal pain, nausea, vomiting, diarrhea, dysuria, fevers, rashes or hallucinations unless otherwise stated above in HPI. ____________________________________________   PHYSICAL EXAM:  VITAL SIGNS: Vitals:   06/14/16 1215 06/14/16 1318  BP: 139/78 (!) 143/84  Pulse: 89 88  Resp: 17 17  Temp: 98.3 F (36.8 C)     Constitutional: Alert and oriented. Chronically ill appearing and in no acute distress. Eyes: Conjunctivae are normal. PERRL. EOMI. Head: Atraumatic. Nose: No congestion/rhinnorhea. Mouth/Throat: Mucous membranes are moist.  Oropharynx non-erythematous. Neck: No stridor. Painless ROM. No cervical spine tenderness to palpation Hematological/Lymphatic/Immunilogical: No cervical lymphadenopathy. Cardiovascular: Normal rate, regular rhythm. Grossly normal heart sounds.  Good peripheral circulation. Respiratory: Normal respiratory effort.  No retractions. Lungs CTAB. Gastrointestinal: Soft and nontender. No distention. No abdominal bruits. No CVA tenderness. Genitourinary:maroon colored stools,  +guaiac positive Musculoskeletal: No lower extremity tenderness nor edema.  No joint effusions. Neurologic:  Normal speech and language. No gross focal neurologic deficits are appreciated.  No gait instability. Skin:  Skin is warm, dry and intact. No rash noted. Psychiatric: Mood and affect are normal. Speech and behavior are normal.  ____________________________________________   LABS (all labs ordered are listed, but only abnormal results are displayed)  Results for orders placed or performed during the hospital encounter of 06/14/16 (from the past 24 hour(s))  CBC with Differential/Platelet     Status: Abnormal   Collection Time: 06/14/16 12:28 PM  Result Value Ref Range   WBC 8.4 3.8 - 10.6 K/uL   RBC 2.19 (L) 4.40 - 5.90 MIL/uL   Hemoglobin 5.8 (L) 13.0 - 18.0 g/dL   HCT 18.6 (L) 40.0 - 52.0 %   MCV 84.8 80.0 - 100.0 fL   MCH 26.6 26.0 - 34.0 pg   MCHC 31.3 (L) 32.0 - 36.0 g/dL   RDW 19.0 (H) 11.5 - 14.5 %   Platelets 343 150 - 440 K/uL   Neutrophils Relative %  78 %   Neutro Abs 6.5 1.4 - 6.5 K/uL   Lymphocytes Relative 9 %   Lymphs Abs 0.7 (L) 1.0 - 3.6 K/uL   Monocytes Relative 9 %   Monocytes Absolute 0.8 0.2 - 1.0 K/uL   Eosinophils Relative 2 %   Eosinophils Absolute 0.2 0 - 0.7 K/uL   Basophils Relative 2 %   Basophils Absolute 0.1 0 - 0.1 K/uL  Basic metabolic panel     Status: Abnormal   Collection Time: 06/14/16 12:28 PM  Result Value Ref Range   Sodium 138 135 - 145 mmol/L   Potassium 5.6 (H) 3.5 - 5.1 mmol/L   Chloride 103 101 - 111 mmol/L   CO2 20 (L) 22 - 32 mmol/L   Glucose, Bld 121 (H) 65 - 99 mg/dL   BUN 100 (H) 6 - 20 mg/dL   Creatinine, Ser 11.27 (H) 0.61 - 1.24 mg/dL   Calcium 8.5 (L) 8.9 - 10.3 mg/dL   GFR calc non Af Amer 4 (L) >60 mL/min   GFR calc Af Amer 5 (L) >60 mL/min   Anion gap 15 5 - 15  Protime-INR     Status: None   Collection Time: 06/14/16 12:28 PM  Result Value Ref Range   Prothrombin Time 15.0 11.4 - 15.2 seconds   INR 1.17   Hepatic function panel     Status: Abnormal   Collection Time: 06/14/16 12:28 PM  Result Value Ref Range   Total Protein 7.3 6.5 - 8.1 g/dL   Albumin 3.2 (L) 3.5 - 5.0 g/dL   AST 28 15  - 41 U/L   ALT 20 17 - 63 U/L   Alkaline Phosphatase 147 (H) 38 - 126 U/L   Total Bilirubin 0.8 0.3 - 1.2 mg/dL   Bilirubin, Direct 0.1 0.1 - 0.5 mg/dL   Indirect Bilirubin 0.7 0.3 - 0.9 mg/dL  Type and screen [facility]     Status: None (Preliminary result)   Collection Time: 06/14/16  1:02 PM  Result Value Ref Range   ABO/RH(D) PENDING    Antibody Screen PENDING    Sample Expiration 06/17/2016    ____________________________________________  EKG My review and personal interpretation at Time: 1300   Indication: gi bleed  Rate: 90  Rhythm: sinus Axis: normal Other: inferio-lateral t wave inversions, no STEMI ____________________________________________  RADIOLOGY  I personally reviewed all radiographic images ordered to evaluate for the above acute complaints and reviewed radiology reports and findings.  These findings were personally discussed with the patient.  Please see medical record for radiology report.  ____________________________________________   PROCEDURES  Procedure(s) performed:  Procedures    Critical Care performed: yes CRITICAL CARE Performed by: Merlyn Lot   Total critical care time: 45 minutes  Critical care time was exclusive of separately billable procedures and treating other patients.  Critical care was necessary to treat or prevent imminent or life-threatening deterioration.  Critical care was time spent personally by me on the following activities: development of treatment plan with patient and/or surrogate as well as nursing, discussions with consultants, evaluation of patient's response to treatment, examination of patient, obtaining history from patient or surrogate, ordering and performing treatments and interventions, ordering and review of laboratory studies, ordering and review of radiographic studies, pulse oximetry and re-evaluation of patient's  condition.  ____________________________________________   INITIAL IMPRESSION / ASSESSMENT AND PLAN / ED COURSE  Pertinent labs & imaging results that were available during my care of the patient were reviewed  by me and considered in my medical decision making (see chart for details).  DDX: gi bleed, ischemia, hyperkalemia, volume olverload  Stanley [name] is a 56 y.o. who presents to the ED with maroon-colored stools for the past several days. Patient also missing several days of dialysis. Patient arrives well appearing and in no acute distress but does have guaiac-positive stools and evidence of acute blood loss anemia. Patient also with mild hyperkalemia with EKG changes. We'll temporize. Patient will require transfusion as his hemoglobin is acutely low and the patient is symptomatic with evidence of active hemorrhage. We will touch base with GI and nephrology regarding his presentation and patient will need admission to hospital for further evaluation and management.      ____________________________________________   FINAL CLINICAL IMPRESSION(S) / ED DIAGNOSES  Final diagnoses:  Acute GI bleeding  Hyperkalemia  ESRD (end stage renal disease) on dialysis ([place])      NEW MEDICATIONS STARTED DURING THIS VISIT:  New Prescriptions   No medications on file     Note:  This document was prepared using Dragon voice recognition software and may include unintentional dictation errors.    Merlyn Lot, MD 06/14/16 1425

## 2016-06-14 NOTE — Progress Notes (Signed)
Post HD  

## 2016-06-14 NOTE — ED Triage Notes (Signed)
Per [facility], patient comes from home with c/o rectal bleeding. Patient states, "I've been here about 10 times for the same thing. Ya'll cant figure it out". Patient ambulatory to stretcher. Patient requesting tv remote. Eating sweat tarts during assessment. Patient denies pain. VSS. Patient is a dialysis patient but is non compliant. Last treatment was last Tuesday.

## 2016-06-14 NOTE — H&P (Signed)
[facility] [place] NAME: Stanley [name]    MR#:  [id number]  DATE OF BIRTH:  April 26, 1960  DATE OF ADMISSION:  06/14/2016  PRIMARY CARE PHYSICIAN: Elyse Jarvis, MD   REQUESTING/REFERRING PHYSICIAN: Dr. Quentin Cornwall  CHIEF COMPLAINT:   Chief Complaint  Patient presents with  . Rectal Bleeding    HISTORY OF PRESENT ILLNESS:  Stanley [name]  is a 56 y.o. male with a known history of Incisional disease on hemodialysis, alcoholic cirrhosis, GI bleed, anemia of chronic disease, hypertension, diabetes with recent endoscopies and small bowel capsular endoscopies presents to the emergency room complaining of rectal bleeding. He complains of bright red blood. No melena. No constipation. He does have noncompliance with dialysis. He is unable to tell me when his last dialysis was. He recently changed his dialysis center. Is not on any iron supplements. He does take a baby aspirin every day. Patient had workup in [place] with recent endoscopies but those records are not available. From what the patient is telling me he had cauterization done to stop the bleeding. Today's hemoglobin is 5.8. Afebrile, no abdominal pain. Not lightheaded or dizzy. No chest pain or shortness of breath.  PAST MEDICAL HISTORY:   Past Medical History:  Diagnosis Date  . Alcohol abuse   . Cirrhosis ([place])   . Coronary artery disease 2009  . Diabetic peripheral neuropathy ([place])   . Drug abuse   . End stage renal disease on dialysis [facility]) NEPHROLOGIST-   DR [facility]  IN [place]   HEMODIALYSIS --   TUES/  THURS/  SAT  . GERD (gastroesophageal reflux disease)   . Hyperlipidemia   . Hypertension   . PAD (peripheral artery disease) ([place])   . Renal insufficiency    Per pt, 32 oz fluid restriction per day  . S/P triple vessel bypass 06/09/2016   2009ish  . Suicidal ideation    & HOMICIDAL IDEATION --  06-16-2013   ADMITTED TO BEHAVIOR HEALTH    PAST SURGICAL HISTORY:    Past Surgical History:  Procedure Laterality Date  . CORONARY ANGIOPLASTY  ?   PT UNABLE TO TELL IF  BEFORE OR AFTER  CABG  . CORONARY ARTERY BYPASS GRAFT  2008  ([place] , [place])   3 VESSEL  . DIALYSIS FISTULA CREATION  LAST SURGERY  APPOX  2008  . ENTEROSCOPY N/A 05/10/2016   Procedure: ENTEROSCOPY;  Surgeon: Jerene Bears, MD;  Location: [facility];  Service: Gastroenterology;  Laterality: N/A;  . ESOPHAGOGASTRODUODENOSCOPY N/A 05/07/2015   Procedure: ESOPHAGOGASTRODUODENOSCOPY (EGD);  Surgeon: Hulen Luster, MD;  Location: [facility] ENDOSCOPY;  Service: Endoscopy;  Laterality: N/A;  . ESOPHAGOGASTRODUODENOSCOPY (EGD) WITH PROPOFOL N/A 05/17/2015   Procedure: ESOPHAGOGASTRODUODENOSCOPY (EGD) WITH PROPOFOL;  Surgeon: [name], MD;  Location: [facility] ENDOSCOPY;  Service: Endoscopy;  Laterality: N/A;  . ESOPHAGOGASTRODUODENOSCOPY (EGD) WITH PROPOFOL N/A 01/20/2016   Procedure: ESOPHAGOGASTRODUODENOSCOPY (EGD) WITH PROPOFOL;  Surgeon: [name], MD;  Location: [facility] ENDOSCOPY;  Service: Endoscopy;  Laterality: N/A;  . ESOPHAGOGASTRODUODENOSCOPY (EGD) WITH PROPOFOL N/A 04/17/2016   Procedure: ESOPHAGOGASTRODUODENOSCOPY (EGD) WITH PROPOFOL;  Surgeon: Lin Landsman, MD;  Location: [facility] ENDOSCOPY;  Service: Gastroenterology;  Laterality: N/A;  . ESOPHAGOGASTRODUODENOSCOPY (EGD) WITH PROPOFOL  05/09/2016   Procedure: ESOPHAGOGASTRODUODENOSCOPY (EGD) WITH PROPOFOL;  Surgeon: Jerene Bears, MD;  Location: [place] [facility];  Service: Endoscopy;;  . GIVENS CAPSULE STUDY N/A 05/07/2016   Procedure: GIVENS CAPSULE STUDY;  Surgeon: Doran Stabler, MD;  Location: [place];  Service: Endoscopy;  Laterality: N/A;  . MANDIBULAR HARDWARE REMOVAL N/A 07/29/2013   Procedure: REMOVAL OF ARCH BARS;  Surgeon: Theodoro Kos, DO;  Location: [facility];  Service: Plastics;  Laterality: N/A;  . ORIF MANDIBULAR FRACTURE N/A 06/05/2013   Procedure: REPAIR OF MANDIBULAR FRACTURE x 2 with maxillo-mandibular fixation  ;  Surgeon: Theodoro Kos, DO;  Location: [facility];  Service: Plastics;  Laterality: N/A;  . PERIPHERAL ARTERIAL STENT GRAFT Left     SOCIAL HISTORY:   Social History  Substance Use Topics  . Smoking status: Current Every Day Smoker    Packs/day: 0.15    Years: 40.00    Types: Cigarettes  . Smokeless tobacco: Never Used  . Alcohol use No     Comment: pt reports quitting after learning about cirrhosis    FAMILY HISTORY:   Family History  Problem Relation Age of Onset  . Colon cancer Mother   . Cancer Father   . Cancer Sister     DRUG ALLERGIES:  No Known Allergies  REVIEW OF SYSTEMS:   Review of Systems  Constitutional: Positive for malaise/fatigue. Negative for chills, fever and weight loss.  HENT: Negative for hearing loss and nosebleeds.   Eyes: Negative for blurred vision, double vision and pain.  Respiratory: Negative for cough, hemoptysis, sputum production, shortness of breath and wheezing.   Cardiovascular: Negative for chest pain, palpitations, orthopnea and leg swelling.  Gastrointestinal: Positive for blood in stool. Negative for abdominal pain, constipation, diarrhea, nausea and vomiting.  Genitourinary: Negative for dysuria and hematuria.  Musculoskeletal: Negative for back pain, falls and myalgias.  Skin: Negative for rash.  Neurological: Positive for weakness. Negative for dizziness, tremors, sensory change, speech change, focal weakness, seizures and headaches.  Endo/Heme/Allergies: Does not bruise/bleed easily.  Psychiatric/Behavioral: Negative for depression and memory loss. The patient is not nervous/anxious.     MEDICATIONS AT HOME:   Prior to Admission medications   Medication Sig Start Date End Date Taking? Authorizing Provider  atorvastatin (LIPITOR) 40 MG tablet Take 40 mg by mouth daily. 04/19/16  Yes Historical Provider, MD  budesonide-formoterol (SYMBICORT) 160-4.5 MCG/ACT inhaler Inhale 2 puffs into the lungs daily.   Yes Historical Provider,  MD  furosemide (LASIX) 80 MG tablet Take 80 mg by mouth daily. 05/11/16  Yes Historical Provider, MD  gabapentin (NEURONTIN) 300 MG capsule Take 1 capsule (300 mg total) by mouth daily. Patient taking differently: Take 300 mg by mouth at bedtime.  03/27/15  Yes Bettey Costa, MD  Multiple Vitamins-Minerals-FA (DIALYVITE SUPREME D) 3 MG TABS Take 1 tablet by mouth daily. 05/27/16  Yes Historical Provider, MD  mupirocin ointment (BACTROBAN) 2 % Place 1 application into the nose 2 (two) times daily. 06/01/16  Yes Theodoro Grist, MD  naproxen (NAPROSYN) 500 MG tablet Take 500 mg by mouth 2 (two) times daily as needed. for pain 04/28/16  Yes Historical Provider, MD  nitroGLYCERIN (NITROSTAT) 0.4 MG SL tablet Place 1 tablet (0.4 mg total) under the tongue every 5 (five) minutes as needed. Patient taking differently: Place 0.4 mg under the tongue every 5 (five) minutes as needed for chest pain.  04/12/16  Yes Wende Bushy, MD  pantoprazole (PROTONIX) 40 MG tablet Take 1 tablet (40 mg total) by mouth daily. 05/10/16  Yes Shanker Kristeen Mans, MD  sevelamer carbonate (RENVELA) 800 MG tablet Take 3 tablets (2,400 mg total) by mouth 3 (three) times daily with meals. 06/01/16  Yes Theodoro Grist, MD  amLODipine (NORVASC) 5 MG tablet  Take 5 mg by mouth daily.    Historical Provider, MD  b complex-vitamin c-folic acid (NEPHRO-VITE) 0.8 MG TABS tablet Take 1 tablet by mouth daily.    Historical Provider, MD  hydrOXYzine (VISTARIL) 25 MG capsule Take 1 capsule (25 mg total) by mouth 3 (three) times daily as needed for itching. Patient not taking: Reported on 05/06/2016 02/25/16   Jami L Hagler, PA-C  labetalol (NORMODYNE) 100 MG tablet Take 1 tablet (100 mg total) by mouth daily. Patient not taking: Reported on 06/14/2016 01/22/16   Epifanio Lesches, MD  oxyCODONE-acetaminophen (ROXICET) 5-325 MG tablet Take 1-2 tablets by mouth every 4 (four) hours as needed for severe pain. Patient not taking: Reported on 06/14/2016 04/21/16   Hinda Kehr, MD     VITAL SIGNS:  Blood pressure 127/71, pulse 84, temperature 98.3 F (36.8 C), temperature source Oral, resp. rate 16, height 6\' 3"  (1.905 m), weight 83.9 kg (185 lb), SpO2 98 %.  PHYSICAL EXAMINATION:  Physical Exam  GENERAL:  55 y.o.-year-old patient lying in the bed with no acute distress.  EYES: Pupils equal, round, reactive to light and accommodation. No scleral icterus. Extraocular muscles intact.  HEENT: Head atraumatic, normocephalic. Oropharynx and nasopharynx clear. No oropharyngeal erythema, moist oral mucosa  NECK:  Supple, no jugular venous distention. No thyroid enlargement, no tenderness.  LUNGS: Normal breath sounds bilaterally, no wheezing, rales, rhonchi. No use of accessory muscles of respiration.  CARDIOVASCULAR: S1, S2 normal. No murmurs, rubs, or gallops.  ABDOMEN: Soft, nontender, nondistended. Bowel sounds present. No organomegaly or mass.  EXTREMITIES: No pedal edema, cyanosis, or clubbing. + 2 pedal & radial pulses b/l.   NEUROLOGIC: Cranial nerves II through XII are intact. No focal Motor or sensory deficits appreciated b/l PSYCHIATRIC: The patient is alert and oriented x 3. Good affect.  SKIN: No obvious rash, lesion, or ulcer.   LABORATORY PANEL:   CBC  Recent Labs Lab 06/14/16 1228  WBC 8.4  HGB 5.8*  HCT 18.6*  PLT 343   ------------------------------------------------------------------------------------------------------------------  Chemistries   Recent Labs Lab 06/14/16 1228  NA 138  K 5.6*  CL 103  CO2 20*  GLUCOSE 121*  BUN 100*  CREATININE 11.27*  CALCIUM 8.5*  AST 28  ALT 20  ALKPHOS 147*  BILITOT 0.8   ------------------------------------------------------------------------------------------------------------------  Cardiac Enzymes No results for input(s): TROPONINI in the last 168  hours. ------------------------------------------------------------------------------------------------------------------  RADIOLOGY:  No results found.   IMPRESSION AND PLAN:   * Rectal bleeding. Unclear if this is upper or lower GI bleed. Hemodynamically stable. Hemoglobin 5.8. Likely acute on chronic blood loss. We'll start Protonix drip. Clear liquids. GI has been informed. We will schedule for endoscopy procedures. Hold aspirin. No heparin or Lovenox. Serial hemoglobin levels. Transfuse 2 units packed RBC.  * And stage renal disease on hemodialysis. Discussed with Dr. Holley Raring of nephrology. Patient will get dialysis today.  * Hypertension. Continue home medications.  * Alcoholic cirrhosis. Normal platelets and INR.  * DVT prophylaxis with SCDs.    All the records are reviewed and case discussed with ED provider. Management plans discussed with the patient, family and they are in agreement.  CODE STATUS: Full code  TOTAL TIME TAKING CARE OF THIS PATIENT: 40 minutes.   Hillary Bow R M.D on 06/14/2016 at 2:56 PM  Between 7am to 6pm - Pager - [phone]  After 6pm go to [email or web address] - password [email or web address] [facility]  Office  [phone]  CC: Primary care physician; Minna Merritts  [place], MD  Note: This dictation was prepared with Dragon dictation along with smaller phrase technology. Any transcriptional errors that result from this process are unintentional.

## 2016-06-14 NOTE — ED Notes (Signed)
Patient has returned from dialysis. Awaiting bed placement at this time.

## 2016-06-15 ENCOUNTER — Encounter: Payer: Self-pay | Admitting: Registered Nurse

## 2016-06-15 ENCOUNTER — Inpatient Hospital Stay
Admission: EM | Disposition: A | Discharge status: Home / Self Care | Payer: Self-pay | Source: Home / Self Care | Attending: Internal Medicine

## 2016-06-15 LAB — BASIC METABOLIC PANEL
ANION GAP: 12 (ref 5–15)
BUN: 77 mg/dL — ABNORMAL HIGH (ref 6–20)
CALCIUM: 8.2 mg/dL — AB (ref 8.9–10.3)
CO2: 22 mmol/L (ref 22–32)
CREATININE: 8.78 mg/dL — AB (ref 0.61–1.24)
Chloride: 105 mmol/L (ref 101–111)
GFR calc Af Amer: 7 mL/min — ABNORMAL LOW (ref >60)
GFR, EST NON AFRICAN AMERICAN: 6 mL/min — AB (ref >60)
Glucose, Bld: 107 mg/dL — ABNORMAL HIGH (ref 65–99)
Potassium: 5.4 mmol/L — ABNORMAL HIGH (ref 3.5–5.1)
Sodium: 139 mmol/L (ref 135–145)

## 2016-06-15 LAB — CBC
HCT: 14.4 % — CL (ref 40.0–52.0)
HCT: 19.5 % — ABNORMAL LOW (ref 40.0–52.0)
HEMOGLOBIN: 4.6 g/dL — AB (ref 13.0–18.0)
Hemoglobin: 6.4 g/dL — ABNORMAL LOW (ref 13.0–18.0)
MCH: 27.1 pg (ref 26.0–34.0)
MCH: 26.7 pg (ref 26.0–34.0)
MCHC: 32.2 g/dL (ref 32.0–36.0)
MCHC: 32.7 g/dL (ref 32.0–36.0)
MCV: 84.3 fL (ref 80.0–100.0)
MCV: 81.8 fL (ref 80.0–100.0)
PLATELETS: 276 10*3/uL (ref 150–440)
PLATELETS: 288 10*3/uL (ref 150–440)
RBC: 1.71 MIL/uL — ABNORMAL LOW (ref 4.40–5.90)
RBC: 2.38 MIL/uL — ABNORMAL LOW (ref 4.40–5.90)
RDW: 18.2 % — AB (ref 11.5–14.5)
RDW: 17.1 % — AB (ref 11.5–14.5)
WBC: 8.6 10*3/uL (ref 3.8–10.6)
WBC: 7.1 10*3/uL (ref 3.8–10.6)

## 2016-06-15 LAB — HEMOGLOBIN AND HEMATOCRIT, BLOOD
HEMATOCRIT: 21.6 % — AB (ref 40.0–52.0)
HEMOGLOBIN: 7.1 g/dL — AB (ref 13.0–18.0)

## 2016-06-15 LAB — GLUCOSE, CAPILLARY
GLUCOSE-CAPILLARY: 82 mg/dL (ref 65–99)
GLUCOSE-CAPILLARY: 132 mg/dL — AB (ref 65–99)
Glucose-Capillary: 105 mg/dL — ABNORMAL HIGH (ref 65–99)

## 2016-06-15 LAB — HEMOGLOBIN A1C
HEMOGLOBIN A1C: 5 % (ref 4.8–5.6)
Mean Plasma Glucose: 97 mg/dL

## 2016-06-15 LAB — PREPARE RBC (CROSSMATCH)

## 2016-06-15 LAB — IRON AND TIBC
IRON: 28 ug/dL — AB (ref 45–182)
SATURATION RATIOS: 8 % — AB (ref 17.9–39.5)
TIBC: 366 ug/dL (ref 250–450)
UIBC: 338 ug/dL

## 2016-06-15 LAB — PHOSPHORUS: PHOSPHORUS: 2.3 mg/dL — AB (ref 2.5–4.6)

## 2016-06-15 LAB — FERRITIN: FERRITIN: 43 ng/mL (ref 24–336)

## 2016-06-15 LAB — VITAMIN B12: Vitamin B-12: 514 pg/mL (ref 180–914)

## 2016-06-15 LAB — FOLATE: Folate: 19.4 ng/mL (ref >5.9)

## 2016-06-15 SURGERY — ESOPHAGOGASTRODUODENOSCOPY (EGD) WITH PROPOFOL
Anesthesia: General

## 2016-06-15 MED ORDER — MOMETASONE FURO-FORMOTEROL FUM 200-5 MCG/ACT IN AERO
2.0000 | INHALATION_SPRAY | Freq: Two times a day (BID) | RESPIRATORY_TRACT | Status: DC
  Administered 2016-06-15 – 2016-06-19 (×7): 2 via RESPIRATORY_TRACT
  Filled 2016-06-15: qty 8.8

## 2016-06-15 MED ORDER — SEVELAMER CARBONATE 800 MG PO TABS
2400.0000 mg | ORAL_TABLET | Freq: Three times a day (TID) | ORAL | Status: DC
  Administered 2016-06-15 – 2016-06-19 (×7): 2400 mg via ORAL
  Filled 2016-06-15 (×8): qty 3

## 2016-06-15 MED ORDER — FUROSEMIDE 80 MG PO TABS
80.0000 mg | ORAL_TABLET | Freq: Every day | ORAL | Status: DC
  Administered 2016-06-17 – 2016-06-19 (×3): 80 mg via ORAL
  Filled 2016-06-15 (×3): qty 1

## 2016-06-15 MED ORDER — SODIUM CHLORIDE 0.9 % IV SOLN
Freq: Once | INTRAVENOUS | Status: AC
  Administered 2016-06-15: 13:00:00 via INTRAVENOUS

## 2016-06-15 MED ORDER — SODIUM CHLORIDE 0.9 % IV SOLN: Freq: Once | INTRAVENOUS | Status: DC

## 2016-06-15 MED ORDER — GABAPENTIN 300 MG PO CAPS
300.0000 mg | ORAL_CAPSULE | Freq: Every day | ORAL | Status: DC
  Administered 2016-06-15 – 2016-06-18 (×4): 300 mg via ORAL
  Filled 2016-06-15 (×4): qty 1

## 2016-06-15 MED ORDER — MORPHINE SULFATE (PF) 2 MG/ML IV SOLN
1.0000 mg | Freq: Once | INTRAVENOUS | Status: AC
  Administered 2016-06-15: 1 mg via INTRAVENOUS
  Filled 2016-06-15: qty 1

## 2016-06-15 MED ORDER — AMLODIPINE BESYLATE 5 MG PO TABS
5.0000 mg | ORAL_TABLET | Freq: Every day | ORAL | Status: DC
  Administered 2016-06-17 – 2016-06-19 (×3): 5 mg via ORAL
  Filled 2016-06-15 (×3): qty 1

## 2016-06-15 MED ORDER — LABETALOL HCL 100 MG PO TABS
100.0000 mg | ORAL_TABLET | Freq: Every day | ORAL | Status: DC
  Administered 2016-06-17 – 2016-06-19 (×3): 100 mg via ORAL
  Filled 2016-06-15 (×3): qty 1

## 2016-06-15 NOTE — Progress Notes (Signed)
Pre HD assessment  

## 2016-06-15 NOTE — Progress Notes (Signed)
Pt refuses bed alarm noting that he does not need anyone to help him walk. I educated the Pt on the fact that he is here for rectal bleeding and that his Hemoglobin is low. I explained to Pt that with his diagnosis there is the chance of him getting light headed or weak and falling. I advised that he ring the bell when he needs to get up to the BR.  Pt agreed to ring the bell for assistance.

## 2016-06-15 NOTE — Progress Notes (Addendum)
EGD cancelled today as his Hb < 6 grams, being transfused , will schedule for procedure tomorrow.   Ordered iron studies and if low will need IV iron   Dr [name]  Gastroenterology/Hepatology Pager: [phone]

## 2016-06-15 NOTE — Progress Notes (Signed)
Patient back from dialysis and Dr. Manuella Ghazi at the bedside and orders received to give one unit of blood today.

## 2016-06-15 NOTE — Progress Notes (Signed)
POst HD

## 2016-06-15 NOTE — Progress Notes (Signed)
Pt admitted to unit with a Hemoglobin of 5.8. It was reported that Pt got one unit RBC in HD whilst being Stanley [name] in the ED. Pt had BM and noted that it was bloody. Hem. was checked and result was 4.6. MD notified and orders placed.

## 2016-06-15 NOTE — Progress Notes (Signed)
PRE HD   

## 2016-06-15 NOTE — Progress Notes (Signed)
HD tx ended 

## 2016-06-15 NOTE — Care Management (Signed)
Amanda Morris HD liaison notified of admission.  

## 2016-06-15 NOTE — Progress Notes (Signed)
HD started. 

## 2016-06-15 NOTE — Progress Notes (Signed)
[facility] [place] NAME: Stanley [name]    MR#:  [id number]  DATE OF BIRTH:  19-Mar-1960  SUBJECTIVE:  CHIEF COMPLAINT:   Chief Complaint  Patient presents with  . Rectal Bleeding  no further bleed, waiting for endoscopy REVIEW OF SYSTEMS:  Review of Systems  Constitutional: Negative for chills, fever and weight loss.  HENT: Negative for nosebleeds and sore throat.   Eyes: Negative for blurred vision.  Respiratory: Negative for cough, shortness of breath and wheezing.   Cardiovascular: Negative for chest pain, orthopnea, leg swelling and PND.  Gastrointestinal: Positive for blood in stool. Negative for abdominal pain, constipation, diarrhea, heartburn, nausea and vomiting.  Genitourinary: Negative for dysuria and urgency.  Musculoskeletal: Negative for back pain.  Skin: Negative for rash.  Neurological: Negative for dizziness, speech change, focal weakness and headaches.  Endo/Heme/Allergies: Does not bruise/bleed easily.  Psychiatric/Behavioral: Negative for depression.    DRUG ALLERGIES:  No Known Allergies VITALS:  Blood pressure 128/71, pulse 81, temperature 98.6 F (37 C), temperature source Oral, resp. rate 18, height 6\' 3"  (1.905 m), weight 87.7 kg (193 lb 5.5 oz), SpO2 100 %. PHYSICAL EXAMINATION:  Physical Exam  Constitutional: He is oriented to person, place, and time and well-developed, well-nourished, and in no distress.  HENT:  Head: Normocephalic and atraumatic.  Eyes: Conjunctivae and EOM are normal. Pupils are equal, round, and reactive to light.  Neck: Normal range of motion. Neck supple. No tracheal deviation present. No thyromegaly present.  Cardiovascular: Normal rate, regular rhythm and normal heart sounds.   Pulmonary/Chest: Effort normal and breath sounds normal. No respiratory distress. He has no wheezes. He exhibits no tenderness.  Abdominal: Soft. Bowel sounds are normal. He exhibits no distension.  There is no tenderness.  Musculoskeletal: Normal range of motion.  Neurological: He is alert and oriented to person, place, and time. No cranial nerve deficit.  Skin: Skin is warm and dry. No rash noted.  Psychiatric: Mood and affect normal.   LABORATORY PANEL:  Male CBC  Recent Labs Lab 06/15/16 1300  WBC 7.1  HGB 6.4*  HCT 19.5*  PLT 288   ------------------------------------------------------------------------------------------------------------------ Chemistries   Recent Labs Lab 06/14/16 1228 06/15/16 0147  NA 138 139  K 5.6* 5.4*  CL 103 105  CO2 20* 22  GLUCOSE 121* 107*  BUN 100* 77*  CREATININE 11.27* 8.78*  CALCIUM 8.5* 8.2*  AST 28  --   ALT 20  --   ALKPHOS 147*  --   BILITOT 0.8  --    RADIOLOGY:  No results found. ASSESSMENT AND PLAN:   * Rectal bleeding - Likely acute on chronic blood loss. - continue Protonix drip. Transfused 2 units packed RBC. 1 more ordered earlier today as Hb still 6.4 Endoscopy planned for tomorrow  * end stage renal disease on hemodialysis: per Nephro.  * Hyperkalemia: should resolve with HD  * Hypertension. Continue home medications.  * Alcoholic cirrhosis. Normal platelets and INR.  * DVT prophylaxis with SCDs.     All the records are reviewed and case discussed with Care Management/Social Worker. Management plans discussed with the patient, family and they are in agreement.  CODE STATUS: Full Code  TOTAL TIME TAKING CARE OF THIS PATIENT: 45 minutes.   More than 50% of the time was spent in counseling/coordination of care: YES  POSSIBLE D/C IN 2-3 DAYS, DEPENDING ON CLINICAL CONDITION. And GI eval   Max Sane M.D on 06/15/2016 at  6:16 PM  Between 7am to 6pm - Pager - [phone]  After 6pm go to [email or web address] - Proofreader  [employer] [facility]  Office  [phone]  CC: Primary care physician; Elyse Jarvis, MD  Note: This dictation was prepared with Dragon  dictation along with smaller phrase technology. Any transcriptional errors that result from this process are unintentional.

## 2016-06-15 NOTE — Progress Notes (Signed)
EGD postponed until tomorrow per Dr. Vicente Males, Patient taken to dialysis

## 2016-06-15 NOTE — Progress Notes (Signed)
[facility]  ROUNDING NOTE   Subjective:  Patient well known to [name]. He came in yesterday with rectal bleeding. Hemoglobin was very low at 5.8 upon presentation. Repeat hemoglobin was actually down to 4.6. Patient is status post blood transfusion. He received hemodialysis yesterday and is also seen during dialysis today.    Objective:  Vital signs in last 24 hours:  Temp:  [97.7 F (36.5 C)-98.6 F (37 C)] 97.8 F (36.6 C) (04/04 1021) Pulse Rate:  [72-93] 72 (04/04 1130) Resp:  [13-21] 15 (04/04 1130) BP: (114-143)/(57-99) 132/85 (04/04 1130) SpO2:  [98 %-100 %] 100 % (04/04 1130) Weight:  [83.9 kg (185 lb)-87.7 kg (193 lb 6.4 oz)] 87.7 kg (193 lb 5.5 oz) (04/04 1021)  Weight change:  Filed Weights   06/14/16 1215 06/14/16 2035 06/15/16 1021  Weight: 83.9 kg (185 lb) 87.7 kg (193 lb 6.4 oz) 87.7 kg (193 lb 5.5 oz)    Intake/Output: I/O last 3 completed shifts: In: 1520 [I.V.:1100; Blood:320; IV Piggyback:100] Out: 1000 [Other:1000]   Intake/Output this shift:  Total I/O In: 600 [Blood:600] Out: -   Physical Exam: General: No acute distress  Head: Normocephalic, atraumatic. Moist oral mucosal membranes  Eyes: Anicteric  Neck: Supple, trachea midline  Lungs:  Clear to auscultation, normal effort  Heart: S1S2 no rubs  Abdomen:  Soft, nontender  Extremities: Trace peripheral edema.  Neurologic: Nonfocal, moving all four extremities  Skin: No lesions  Access: RUE AVF    Basic Metabolic Panel:  Recent Labs Lab 06/14/16 1228 06/14/16 2028 06/15/16 0147  NA 138  --  139  K 5.6*  --  5.4*  CL 103  --  105  CO2 20*  --  22  GLUCOSE 121*  --  107*  BUN 100*  --  77*  CREATININE 11.27*  --  8.78*  CALCIUM 8.5*  --  8.2*  PHOS  --  3.5  --     Liver Function Tests:  Recent Labs Lab 06/14/16 1228  AST 28  ALT 20  ALKPHOS 147*  BILITOT 0.8  PROT 7.3  ALBUMIN 3.2*   No results for input(s): LIPASE, AMYLASE in the last 168 hours. No  results for input(s): AMMONIA in the last 168 hours.  CBC:  Recent Labs Lab 06/14/16 1228 06/15/16 0147  WBC 8.4 8.6  NEUTROABS 6.5  --   HGB 5.8* 4.6*  HCT 18.6* 14.4*  MCV 84.8 84.3  PLT 343 276    Cardiac Enzymes: No results for input(s): CKTOTAL, CKMB, CKMBINDEX, TROPONINI in the last 168 hours.  BNP: Invalid input(s): POCBNP  CBG:  Recent Labs Lab 06/14/16 2037 06/15/16 0739  GLUCAP 135* 82    Microbiology: Results for orders placed or performed during the hospital encounter of 05/30/16  MRSA PCR Screening     Status: Abnormal   Collection Time: 05/31/16  6:45 AM  Result Value Ref Range Status   MRSA by PCR POSITIVE (A) NEGATIVE Final    Comment:        The GeneXpert MRSA Assay (FDA approved for NASAL specimens only), is one component of a comprehensive MRSA colonization surveillance program. It is not intended to diagnose MRSA infection nor to guide or monitor treatment for MRSA infections. RESULT CALLED TO, READ BACK BY AND VERIFIED WITH:  BRAD CHISMON AT 0867 05/31/16 SDR   Body fluid culture     Status: None   Collection Time: 05/31/16  3:20 PM  Result Value Ref Range Status  Specimen Description PERITONEAL  Final   Special Requests NONE  Final   Gram Stain   Final    FEW WBC PRESENT, PREDOMINANTLY MONONUCLEAR NO ORGANISMS SEEN    Culture   Final    NO GROWTH 3 DAYS Performed at [facility], [address], [place], [place] [address]    Report Status 06/04/2016 FINAL  Final    Coagulation Studies:  Recent Labs  06/14/16 1228  LABPROT 15.0  INR 1.17    Urinalysis: No results for input(s): COLORURINE, LABSPEC, PHURINE, GLUCOSEU, HGBUR, BILIRUBINUR, KETONESUR, PROTEINUR, UROBILINOGEN, NITRITE, LEUKOCYTESUR in the last 72 hours.  Invalid input(s): APPERANCEUR    Imaging: No results found.   Medications:   . pantoprozole (PROTONIX) infusion 8 mg/hr (06/15/16 0454)   . sodium chloride   Intravenous Once  . amLODipine   5 mg Oral Daily  . furosemide  80 mg Oral Daily  . gabapentin  300 mg Oral QHS  . insulin aspart  0-9 Units Subcutaneous TID WC  . labetalol  100 mg Oral Daily  . mometasone-formoterol  2 puff Inhalation BID  . [START ON 06/18/2016] pantoprazole  40 mg Intravenous Q12H  . sevelamer carbonate  2,400 mg Oral TID WC  . sodium chloride flush  3 mL Intravenous Q12H  . sodium chloride flush  3 mL Intravenous Q12H   sodium chloride, sodium chloride, sodium chloride, acetaminophen **OR** acetaminophen, albuterol, alteplase, heparin, HYDROcodone-acetaminophen, lidocaine (PF), lidocaine-prilocaine, ondansetron **OR** ondansetron (ZOFRAN) IV, pentafluoroprop-tetrafluoroeth, sodium chloride flush  Assessment/ Plan:  56 y.o. male with ESRD on hemodialysis, hypertension, peripheral vascular disease, coronary artery disease, cirrhosis, substance abuse, recurrent GI bleed  TTS [address] Right arm AVF   1. End stage renal disease with hyperkalemia: Patient seen and evaluated during hemodialysis today. Appears to be tolerating well. Plan to complete hemodialysis today and get him back on his regular dialysis schedule tomorrow.  2. Secondary Hyperparathyroidism:  Recently the patient actually had hypercalcemia however calcium now appears to be low at 8.2. Continue Renvela 3 tablets by mouth 3 times a day with meals for now.  3. Hypertension: Blood pressure currently 132/85. Continue current doses of amlodipine and labetalol.  4. Anemia of chronic kidney disease with GI bleed and anemia blood loss. Most recent hemoglobin was as low as 4.6. Repeat posttransfusion has not yet occurred. Recommend continued monitoring CBC.   LOS: 1 Viraaj Vorndran 4/4/201811:56 AM

## 2016-06-16 ENCOUNTER — Inpatient Hospital Stay: Payer: Medicare Other | Admitting: Anesthesiology

## 2016-06-16 ENCOUNTER — Encounter
Admission: EM | Disposition: A | Discharge status: Home / Self Care | Payer: Self-pay | Source: Home / Self Care | Attending: Internal Medicine

## 2016-06-16 DIAGNOSIS — D62 Acute posthemorrhagic anemia: Secondary | ICD-10-CM

## 2016-06-16 HISTORY — PX: ESOPHAGOGASTRODUODENOSCOPY (EGD) WITH PROPOFOL: SHX5813

## 2016-06-16 LAB — BASIC METABOLIC PANEL
Anion gap: 8 (ref 5–15)
BUN: 57 mg/dL — AB (ref 6–20)
CALCIUM: 8.3 mg/dL — AB (ref 8.9–10.3)
CHLORIDE: 103 mmol/L (ref 101–111)
CO2: 27 mmol/L (ref 22–32)
CREATININE: 7.11 mg/dL — AB (ref 0.61–1.24)
GFR calc Af Amer: 9 mL/min — ABNORMAL LOW (ref >60)
GFR calc non Af Amer: 8 mL/min — ABNORMAL LOW (ref >60)
Glucose, Bld: 101 mg/dL — ABNORMAL HIGH (ref 65–99)
Potassium: 4.9 mmol/L (ref 3.5–5.1)
Sodium: 138 mmol/L (ref 135–145)

## 2016-06-16 LAB — RETICULOCYTES
RBC.: 2.44 MIL/uL — AB (ref 4.40–5.90)
Retic Count, Absolute: 75.6 10*3/uL (ref 19.0–183.0)
Retic Ct Pct: 3.1 % (ref 0.4–3.1)

## 2016-06-16 LAB — RENAL FUNCTION PANEL
ALBUMIN: 2.9 g/dL — AB (ref 3.5–5.0)
Anion gap: 11 (ref 5–15)
BUN: 57 mg/dL — AB (ref 6–20)
CO2: 24 mmol/L (ref 22–32)
CREATININE: 8.1 mg/dL — AB (ref 0.61–1.24)
Calcium: 8.3 mg/dL — ABNORMAL LOW (ref 8.9–10.3)
Chloride: 103 mmol/L (ref 101–111)
GFR calc Af Amer: 8 mL/min — ABNORMAL LOW (ref >60)
GFR, EST NON AFRICAN AMERICAN: 7 mL/min — AB (ref >60)
Glucose, Bld: 113 mg/dL — ABNORMAL HIGH (ref 65–99)
PHOSPHORUS: 5.2 mg/dL — AB (ref 2.5–4.6)
POTASSIUM: 5.1 mmol/L (ref 3.5–5.1)
Sodium: 138 mmol/L (ref 135–145)

## 2016-06-16 LAB — CBC
HCT: 21.6 % — ABNORMAL LOW (ref 40.0–52.0)
HEMATOCRIT: 21.1 % — AB (ref 40.0–52.0)
HEMOGLOBIN: 7.1 g/dL — AB (ref 13.0–18.0)
Hemoglobin: 6.8 g/dL — ABNORMAL LOW (ref 13.0–18.0)
MCH: 27.1 pg (ref 26.0–34.0)
MCH: 27.3 pg (ref 26.0–34.0)
MCHC: 32.7 g/dL (ref 32.0–36.0)
MCHC: 32.5 g/dL (ref 32.0–36.0)
MCV: 82.9 fL (ref 80.0–100.0)
MCV: 84.1 fL (ref 80.0–100.0)
Platelets: 257 10*3/uL (ref 150–440)
Platelets: 244 10*3/uL (ref 150–440)
RBC: 2.61 MIL/uL — ABNORMAL LOW (ref 4.40–5.90)
RBC: 2.51 MIL/uL — ABNORMAL LOW (ref 4.40–5.90)
RDW: 16.3 % — AB (ref 11.5–14.5)
RDW: 16.4 % — AB (ref 11.5–14.5)
WBC: 7.4 10*3/uL (ref 3.8–10.6)
WBC: 7.3 10*3/uL (ref 3.8–10.6)

## 2016-06-16 LAB — LACTATE DEHYDROGENASE: LDH: 142 U/L (ref 98–192)

## 2016-06-16 LAB — FOLATE: FOLATE: 15.2 ng/mL (ref >5.9)

## 2016-06-16 LAB — GLUCOSE, CAPILLARY
Glucose-Capillary: 89 mg/dL (ref 65–99)
Glucose-Capillary: 104 mg/dL — ABNORMAL HIGH (ref 65–99)

## 2016-06-16 LAB — IRON AND TIBC
Iron: 30 ug/dL — ABNORMAL LOW (ref 45–182)
SATURATION RATIOS: 9 % — AB (ref 17.9–39.5)
TIBC: 343 ug/dL (ref 250–450)
UIBC: 313 ug/dL

## 2016-06-16 LAB — PREPARE RBC (CROSSMATCH)

## 2016-06-16 LAB — PHOSPHORUS: PHOSPHORUS: 5 mg/dL — AB (ref 2.5–4.6)

## 2016-06-16 LAB — FERRITIN: Ferritin: 44 ng/mL (ref 24–336)

## 2016-06-16 SURGERY — ESOPHAGOGASTRODUODENOSCOPY (EGD) WITH PROPOFOL
Anesthesia: General

## 2016-06-16 MED ORDER — PROPOFOL 500 MG/50ML IV EMUL
INTRAVENOUS | Status: DC | PRN
  Administered 2016-06-16: 125 ug/kg/min via INTRAVENOUS

## 2016-06-16 MED ORDER — PROPOFOL 10 MG/ML IV BOLUS
INTRAVENOUS | Status: DC | PRN
  Administered 2016-06-16: 50 mg via INTRAVENOUS
  Administered 2016-06-16: 20 mg via INTRAVENOUS
  Administered 2016-06-16: 30 mg via INTRAVENOUS

## 2016-06-16 MED ORDER — SODIUM CHLORIDE 0.9 % IV SOLN
Freq: Once | INTRAVENOUS | Status: AC
  Administered 2016-06-16: via INTRAVENOUS

## 2016-06-16 MED ORDER — PROPOFOL 500 MG/50ML IV EMUL
INTRAVENOUS | Status: AC
  Filled 2016-06-16: qty 50

## 2016-06-16 MED ORDER — GLYCOPYRROLATE 0.2 MG/ML IJ SOLN
INTRAMUSCULAR | Status: DC | PRN
  Administered 2016-06-16: 0.2 mg via INTRAVENOUS

## 2016-06-16 MED ORDER — HYDROCODONE-ACETAMINOPHEN 5-325 MG PO TABS: 1.0000 | ORAL_TABLET | Freq: Once | ORAL | Status: DC

## 2016-06-16 MED ORDER — PROPOFOL 10 MG/ML IV BOLUS
INTRAVENOUS | Status: AC
  Filled 2016-06-16: qty 20

## 2016-06-16 MED ORDER — SODIUM CHLORIDE 0.9 % IV SOLN
Freq: Once | INTRAVENOUS | Status: AC
  Administered 2016-06-16: 22:00:00 via INTRAVENOUS

## 2016-06-16 MED ORDER — SODIUM CHLORIDE 0.9 % IV SOLN: Freq: Once | INTRAVENOUS | Status: AC

## 2016-06-16 NOTE — Progress Notes (Signed)
[place] at [place] NAME: Stanley [name]    MR#:  [id number]  DATE OF BIRTH:  [birth year]  SUBJECTIVE:  CHIEF COMPLAINT:   Chief Complaint  Patient presents with  . Rectal Bleeding  no further bleed, waiting for endoscopy REVIEW OF SYSTEMS:  Review of Systems  Constitutional: Negative for chills, fever and weight loss.  HENT: Negative for nosebleeds and sore throat.   Eyes: Negative for blurred vision.  Respiratory: Negative for cough, shortness of breath and wheezing.   Cardiovascular: Negative for chest pain, orthopnea, leg swelling and PND.  Gastrointestinal: Positive for blood in stool. Negative for abdominal pain, constipation, diarrhea, heartburn, nausea and vomiting.  Genitourinary: Negative for dysuria and urgency.  Musculoskeletal: Negative for back pain.  Skin: Negative for rash.  Neurological: Negative for dizziness, speech change, focal weakness and headaches.  Endo/Heme/Allergies: Does not bruise/bleed easily.  Psychiatric/Behavioral: Negative for depression.   DRUG ALLERGIES:  No Known Allergies VITALS:  Blood pressure 125/73, pulse 82, temperature 97.6 F (36.4 C), temperature source Oral, resp. rate 16, height 6\' 3"  (1.905 m), weight 85.8 kg (189 lb 3.2 oz), SpO2 99 %. PHYSICAL EXAMINATION:  Physical Exam  Constitutional: He is oriented to person, place, and time and well-developed, well-nourished, and in no distress.  HENT:  Head: Normocephalic and atraumatic.  Eyes: Conjunctivae and EOM are normal. Pupils are equal, round, and reactive to light.  Neck: Normal range of motion. Neck supple. No tracheal deviation present. No thyromegaly present.  Cardiovascular: Normal rate, regular rhythm and normal heart sounds.   Pulmonary/Chest: Effort normal and breath sounds normal. No respiratory distress. He has no wheezes. He exhibits no tenderness.  Abdominal: Soft. Bowel sounds are normal. He exhibits no distension. There  is no tenderness.  Musculoskeletal: Normal range of motion.  Neurological: He is alert and oriented to person, place, and time. No cranial nerve deficit.  Skin: Skin is warm and dry. No rash noted.  Psychiatric: Mood and affect normal.   LABORATORY PANEL:  Male CBC  Recent Labs Lab 06/16/16 0519  WBC 7.4  HGB 7.1*  HCT 21.6*  PLT 257   ------------------------------------------------------------------------------------------------------------------ Chemistries   Recent Labs Lab 06/14/16 1228  06/16/16 0519  NA 138  < > 138  K 5.6*  < > 4.9  CL 103  < > 103  CO2 20*  < > 27  GLUCOSE 121*  < > 101*  BUN 100*  < > 57*  CREATININE 11.27*  < > 7.11*  CALCIUM 8.5*  < > 8.3*  AST 28  --   --   ALT 20  --   --   ALKPHOS 147*  --   --   BILITOT 0.8  --   --   < > = values in this interval not displayed. RADIOLOGY:  No results found. ASSESSMENT AND PLAN:   * Rectal bleeding - Likely acute on chronic blood loss. - continue Protonix drip. Transfused 2 units packed RBC. 1 more ordered earlier today as Hb still 6.4 EGD Showing no acute pathology.  The reason for this bleed - GI recommends starting clear liquid diet, possible colonoscopy tomorrow - We will check anemia panel and order test to check for hemolysis - We will consider oncology consult if any of those are positive  * End stage renal disease on hemodialysis: per Nephro.  * Hyperkalemia: resolved with HD  * Hypertension. Continue home medications.  * Alcoholic cirrhosis. Normal platelets and INR.  *  DVT prophylaxis with SCDs.     All the records are reviewed and case discussed with Care Management/Social Worker. Management plans discussed with the patient, family and they are in agreement.  CODE STATUS: Full Code  TOTAL TIME TAKING CARE OF THIS PATIENT: 25 minutes.   More than 50% of the time was spent in counseling/coordination of care: YES  POSSIBLE D/C IN 1-2 DAYS, DEPENDING ON CLINICAL  CONDITION. And GI eval   Max Sane M.D on 06/16/2016 at 1:14 PM  Between 7am to 6pm - Pager - [phone]  After 6pm go to [email or web address] - Proofreader  [facility]  Office  [phone]  CC: Primary care physician; Elyse Jarvis, MD  Note: This dictation was prepared with Dragon dictation along with smaller phrase technology. Any transcriptional errors that result from this process are unintentional.

## 2016-06-16 NOTE — Progress Notes (Signed)
  End of hd 

## 2016-06-16 NOTE — Op Note (Signed)
[facility] Patient Name: Stanley [name] Procedure Date: 06/16/2016 11:15 AM MRN: [id number] Account #: [id number] Date of Birth: 11-24-1960 Admit Type: Inpatient Age: 56 Room: [facility] ENDO ROOM 3 Gender: Male Note Status: Finalized Procedure:            Upper GI endoscopy Indications:          Acute post hemorrhagic anemia Providers:            [name] MD, MD Referring MD:         Dyke Maes. Mancheno Revelo (Referring MD) Medicines:            Propofol per Anesthesia Complications:        No immediate complications. Procedure:            Pre-Anesthesia Assessment:                       - Prior to the procedure, a History and Physical was                        performed, and patient medications and allergies were                        reviewed. The patient's tolerance of previous                        anesthesia was also reviewed. The risks and benefits of                        the procedure and the sedation options and risks were                        discussed with the patient. All questions were                        answered, and informed consent was obtained. Prior                        Anticoagulants: The patient has taken no previous                        anticoagulant or antiplatelet agents. ASA Grade                        Assessment: II - A patient with mild systemic disease.                        After reviewing the risks and benefits, the patient was                        deemed in satisfactory condition to undergo the                        procedure.                       After obtaining informed consent, the endoscope was                        passed under direct vision. Throughout the procedure,  the patient's blood pressure, pulse, and oxygen                        saturations were monitored continuously. The Endoscope                        was introduced through the mouth, and advanced to the                      second part of duodenum. The upper GI endoscopy was                        accomplished without difficulty. The patient tolerated                        the procedure well. Findings:      A small hiatal hernia was present.      Localized minimal inflammation characterized by erythema was found in       the gastric antrum.      The examined duodenum was normal. Impression:           - Small hiatal hernia.                       - Gastritis.                       - Normal examined duodenum.                       - No specimens collected. Recommendation:       - Return patient to hospital ward for ongoing care.                       - Clear liquid diet.                       - Consider repeat colonoscopy vs repeat capsule vs.                        another source of anemia such as hemolysis. Procedure Code(s):    --- Professional ---                       [phone], Esophagogastroduodenoscopy, flexible, transoral;                        diagnostic, including collection of specimen(s) by                        brushing or washing, when performed (separate procedure) Diagnosis Code(s):    --- Professional ---                       D62, Acute posthemorrhagic anemia CPT copyright 2016 American Medical Association. All rights reserved. The codes documented in this report are preliminary and upon coder review may  be revised to meet current compliance requirements. [name] MD, MD 06/16/2016 11:34:10 AM This report has been signed electronically. Number of Addenda: 0 Note Initiated On: 06/16/2016 11:15 AM      [facility]

## 2016-06-16 NOTE — Anesthesia Preprocedure Evaluation (Signed)
Anesthesia Evaluation  Patient identified by MRN, date of birth, ID band Patient awake    Reviewed: Allergy & Precautions, NPO status , Patient's Chart, lab work & pertinent test results  History of Anesthesia Complications Negative for: history of anesthetic complications  Airway Mallampati: II  TM Distance: >3 FB Neck ROM: Full    Dental  (+) Poor Dentition, Missing   Pulmonary neg sleep apnea, neg COPD, Current Smoker,    breath sounds clear to auscultation- rhonchi (-) wheezing      Cardiovascular hypertension, + CAD, + Past MI and + CABG   Rhythm:Regular Rate:Normal - Systolic murmurs and - Diastolic murmurs Echo 1/95/09: - Left ventricle: The cavity size was mildly dilated. Systolic   function was normal. The estimated ejection fraction was in the   range of 50% to 55%. Wall motion was normal; there were no   regional wall motion abnormalities. Left ventricular diastolic   function parameters were normal. - Mitral valve: There was moderate regurgitation directed   eccentrically. - Left atrium: The atrium was moderately dilated. - Right ventricle: Systolic function was mildly reduced. - Right atrium: The atrium was mildly dilated. - Tricuspid valve: There was moderate-severe regurgitation. - Pulmonary arteries: Systolic pressure was moderately elevated. PA   peak pressure: 54 mm Hg (S).   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   Endo/Other  diabetes  Renal/GU ESRF and DialysisRenal disease (last dialysis yesterday)     Musculoskeletal   Abdominal (+) - obese,   Peds  Hematology  (+) anemia ,   Anesthesia Other Findings Past Medical History: No date: Alcohol abuse No date: Cirrhosis ([place]) No date: Coronary artery disease No date: Diabetic peripheral neuropathy (HCC) No date: Drug abuse NEPHROLOGIST-   DR [facility]  IN [place]: End stage  renal disease on dialysis ([place])     Comment: HEMODIALYSIS --   TUES/  THURS/  SAT No date: GERD (gastroesophageal reflux disease) No date: Hypertension No date: Suicidal ideation     Comment: & HOMICIDAL IDEATION --  06-16-2013   ADMITTED              TO BEHAVIOR HEALTH   Reproductive/Obstetrics                             Anesthesia Physical  Anesthesia Plan  ASA: IV  Anesthesia Plan: General   Post-op Pain Management:    Induction: Intravenous  Airway Management Planned: Natural Airway  Additional Equipment:   Intra-op Plan:   Post-operative Plan:   Informed Consent: I have reviewed the patients History and Physical, chart, labs and discussed the procedure including the risks, benefits and alternatives for the proposed anesthesia with the patient or authorized representative who has indicated his/her understanding and acceptance.   Dental advisory given  Plan Discussed with: CRNA and Anesthesiologist  Anesthesia Plan Comments:         Anesthesia Quick Evaluation

## 2016-06-16 NOTE — Transfer of Care (Signed)
Immediate Anesthesia Transfer of Care Note  Patient: Stanley [name]  Procedure(s) Performed: Procedure(s): ESOPHAGOGASTRODUODENOSCOPY (EGD) WITH PROPOFOL (N/A)  Patient Location: PACU  Anesthesia Type:General  Level of Consciousness: unresponsive  Airway & Oxygen Therapy: Patient Spontanous Breathing and Patient connected to nasal cannula oxygen  Post-op Assessment: Report given to RN and Post -op Vital signs reviewed and stable  Post vital signs: Reviewed and stable  Last Vitals:  Vitals:   06/16/16 1040 06/16/16 1135  BP: 119/77 125/80  Pulse: 85 91  Resp: 16 16  Temp: 37.2 C 36.4 C    Last Pain:  Vitals:   06/16/16 1135  TempSrc: Tympanic  PainSc: 0-No pain      Patients Stated Pain Goal: 0 (94/80/16 5537)  Complications: No apparent anesthesia complications

## 2016-06-16 NOTE — Anesthesia Post-op Follow-up Note (Cosign Needed)
Anesthesia QCDR form completed.        

## 2016-06-16 NOTE — Progress Notes (Signed)
Pre hd vitals 

## 2016-06-16 NOTE — Anesthesia Postprocedure Evaluation (Signed)
Anesthesia Post Note  Patient: [name]  Procedure(s) Performed: Procedure(s) (LRB): ESOPHAGOGASTRODUODENOSCOPY (EGD) WITH PROPOFOL (N/A)  Patient location during evaluation: Endoscopy Anesthesia Type: General Level of consciousness: awake and alert and oriented Pain management: pain level controlled Vital Signs Assessment: post-procedure vital signs reviewed and stable Respiratory status: spontaneous breathing, nonlabored ventilation and respiratory function stable Cardiovascular status: blood pressure returned to baseline and stable Postop Assessment: no signs of nausea or vomiting Anesthetic complications: no     Last Vitals:  Vitals:   06/16/16 1040 06/16/16 1135  BP: 119/77 125/80  Pulse: 85 91  Resp: 16 16  Temp: 37.2 C 36.4 C    Last Pain:  Vitals:   06/16/16 1135  TempSrc: Tympanic  PainSc: 0-No pain                 Martell Mcfadyen

## 2016-06-16 NOTE — Progress Notes (Signed)
Start of hd 

## 2016-06-16 NOTE — Progress Notes (Signed)
Post hd assessment 

## 2016-06-16 NOTE — Progress Notes (Signed)
Post hd vitals 

## 2016-06-16 NOTE — Progress Notes (Signed)
Pre hd assessment  

## 2016-06-17 ENCOUNTER — Encounter: Payer: Self-pay | Admitting: Gastroenterology

## 2016-06-17 DIAGNOSIS — K625 Hemorrhage of anus and rectum: Secondary | ICD-10-CM

## 2016-06-17 DIAGNOSIS — D500 Iron deficiency anemia secondary to blood loss (chronic): Secondary | ICD-10-CM

## 2016-06-17 LAB — CBC
HEMATOCRIT: 22.8 % — AB (ref 40.0–52.0)
Hemoglobin: 7.4 g/dL — ABNORMAL LOW (ref 13.0–18.0)
MCH: 27 pg (ref 26.0–34.0)
MCHC: 32.5 g/dL (ref 32.0–36.0)
MCV: 83 fL (ref 80.0–100.0)
Platelets: 230 10*3/uL (ref 150–440)
RBC: 2.74 MIL/uL — AB (ref 4.40–5.90)
RDW: 16.8 % — ABNORMAL HIGH (ref 11.5–14.5)
WBC: 7.5 10*3/uL (ref 3.8–10.6)

## 2016-06-17 LAB — BASIC METABOLIC PANEL
ANION GAP: 10 (ref 5–15)
BUN: 39 mg/dL — ABNORMAL HIGH (ref 6–20)
CALCIUM: 8.1 mg/dL — AB (ref 8.9–10.3)
CHLORIDE: 97 mmol/L — AB (ref 101–111)
CO2: 29 mmol/L (ref 22–32)
Creatinine, Ser: 5.99 mg/dL — ABNORMAL HIGH (ref 0.61–1.24)
GFR calc non Af Amer: 9 mL/min — ABNORMAL LOW (ref >60)
GFR, EST AFRICAN AMERICAN: 11 mL/min — AB (ref >60)
Glucose, Bld: 86 mg/dL (ref 65–99)
POTASSIUM: 4.1 mmol/L (ref 3.5–5.1)
Sodium: 136 mmol/L (ref 135–145)

## 2016-06-17 LAB — PARATHYROID HORMONE, INTACT (NO CA): PTH: 246 pg/mL — AB (ref 15–65)

## 2016-06-17 LAB — HAPTOGLOBIN: HAPTOGLOBIN: 195 mg/dL (ref 34–200)

## 2016-06-17 LAB — GLUCOSE, CAPILLARY
GLUCOSE-CAPILLARY: 81 mg/dL (ref 65–99)
Glucose-Capillary: 120 mg/dL — ABNORMAL HIGH (ref 65–99)
Glucose-Capillary: 105 mg/dL — ABNORMAL HIGH (ref 65–99)
Glucose-Capillary: 106 mg/dL — ABNORMAL HIGH (ref 65–99)

## 2016-06-17 LAB — PREPARE RBC (CROSSMATCH)

## 2016-06-17 LAB — VITAMIN B12: Vitamin B-12: 568 pg/mL (ref 180–914)

## 2016-06-17 MED ORDER — PANTOPRAZOLE SODIUM 40 MG PO TBEC
40.0000 mg | DELAYED_RELEASE_TABLET | Freq: Every day | ORAL | Status: DC
  Administered 2016-06-17 – 2016-06-19 (×2): 40 mg via ORAL
  Filled 2016-06-17 (×2): qty 1

## 2016-06-17 MED ORDER — SODIUM CHLORIDE 0.9 % IV SOLN
Freq: Once | INTRAVENOUS | Status: AC
  Administered 2016-06-17: 15:00:00 via INTRAVENOUS

## 2016-06-17 MED ORDER — PEG 3350-KCL-NA BICARB-NACL 420 G PO SOLR
4000.0000 mL | Freq: Once | ORAL | Status: AC
  Administered 2016-06-17: 4000 mL via ORAL
  Filled 2016-06-17: qty 4000

## 2016-06-17 NOTE — Care Management Important Message (Signed)
Important Message  Patient Details  Name: Stanley [name] MRN: [id number] Date of Birth: Aug 09, 1960   Medicare Important Message Given:  Yes    Marshell Garfinkel, RN 06/17/2016, 1:39 PM

## 2016-06-17 NOTE — Progress Notes (Addendum)
[place] at [place] NAME: Stanley [name]    MR#:  [id number]  DATE OF BIRTH:  [birth year]  SUBJECTIVE:  CHIEF COMPLAINT:   Chief Complaint  Patient presents with  . Rectal Bleeding  no further bleed, waiting for colonoscopy REVIEW OF SYSTEMS:  Review of Systems  Constitutional: Negative for chills, fever and weight loss.  HENT: Negative for nosebleeds and sore throat.   Eyes: Negative for blurred vision.  Respiratory: Negative for cough, shortness of breath and wheezing.   Cardiovascular: Negative for chest pain, orthopnea, leg swelling and PND.  Gastrointestinal: Positive for blood in stool. Negative for abdominal pain, constipation, diarrhea, heartburn, nausea and vomiting.  Genitourinary: Negative for dysuria and urgency.  Musculoskeletal: Negative for back pain.  Skin: Negative for rash.  Neurological: Negative for dizziness, speech change, focal weakness and headaches.  Endo/Heme/Allergies: Does not bruise/bleed easily.  Psychiatric/Behavioral: Negative for depression.   DRUG ALLERGIES:  No Known Allergies VITALS:  Blood pressure 106/63, pulse 71, temperature 97.8 F (36.6 C), temperature source Oral, resp. rate 18, height 6\' 3"  (1.905 m), weight 85 kg (187 lb 8 oz), SpO2 98 %. PHYSICAL EXAMINATION:  Physical Exam  Constitutional: He is oriented to person, place, and time and well-developed, well-nourished, and in no distress.  HENT:  Head: Normocephalic and atraumatic.  Eyes: Conjunctivae and EOM are normal. Pupils are equal, round, and reactive to light.  Neck: Normal range of motion. Neck supple. No tracheal deviation present. No thyromegaly present.  Cardiovascular: Normal rate, regular rhythm and normal heart sounds.   Pulmonary/Chest: Effort normal and breath sounds normal. No respiratory distress. He has no wheezes. He exhibits no tenderness.  Abdominal: Soft. Bowel sounds are normal. He exhibits no distension. There  is no tenderness.  Musculoskeletal: Normal range of motion.  Neurological: He is alert and oriented to person, place, and time. No cranial nerve deficit.  Skin: Skin is warm and dry. No rash noted.  Psychiatric: Mood and affect normal.   LABORATORY PANEL:  Male CBC  Recent Labs Lab 06/17/16 0524  WBC 7.5  HGB 7.4*  HCT 22.8*  PLT 230   ------------------------------------------------------------------------------------------------------------------ Chemistries   Recent Labs Lab 06/14/16 1228  06/17/16 0524  NA 138  < > 136  K 5.6*  < > 4.1  CL 103  < > 97*  CO2 20*  < > 29  GLUCOSE 121*  < > 86  BUN 100*  < > 39*  CREATININE 11.27*  < > 5.99*  CALCIUM 8.5*  < > 8.1*  AST 28  --   --   ALT 20  --   --   ALKPHOS 147*  --   --   BILITOT 0.8  --   --   < > = values in this interval not displayed. RADIOLOGY:  No results found. ASSESSMENT AND PLAN:   * Rectal bleeding - Likely acute on chronic blood loss. - change protonix to once a day PO, Transfused 3 units packed RBC. Hb 7.4 - EGD Showing no acute pathology.  The reason for this bleed - GI Planning colonoscopy tomorrow  * End stage renal disease on hemodialysis: per Nephro.  * Hyperkalemia: resolved with HD  * Hypertension. Continue home medications.  * Alcoholic cirrhosis. Normal platelets and INR.  * DVT prophylaxis with SCDs.     All the records are reviewed and case discussed with Care Management/Social Worker. Management plans discussed with the patient, family and they are  in agreement.  CODE STATUS: Full Code  TOTAL TIME TAKING CARE OF THIS PATIENT: 25 minutes.   More than 50% of the time was spent in counseling/coordination of care: YES  POSSIBLE D/C IN 1-2 DAYS, DEPENDING ON CLINICAL CONDITION. And GI eval   Max Sane M.D on 06/17/2016 at 4:51 PM  Between 7am to 6pm - Pager - [phone]  After 6pm go to [email or web address] - Proofreader  [facility]    Office  [phone]  CC: Primary care physician; Elyse Jarvis, MD  Note: This dictation was prepared with Dragon dictation along with smaller phrase technology. Any transcriptional errors that result from this process are unintentional.

## 2016-06-17 NOTE — Progress Notes (Addendum)
[name] MD [address], [place], [place] [address] Phone: [phone] Fax : [phone]  Stanley [name] is being followed for anemia, overt obscure GI bleed Day 3 of follow up   Subjective: Denies any overt bleeding    Objective: Vital signs in last 24 hours: Vitals:   06/17/16 0009 06/17/16 0011 06/17/16 0242 06/17/16 0612  BP: (!) 125/59 (!) 125/59 130/73 134/75  Pulse: 76 76 77 81  Resp: 18 18 18 17   Temp: 98.3 F (36.8 C) 98.3 F (36.8 C) 98.3 F (36.8 C) 98.2 F (36.8 C)  TempSrc: Oral Oral Oral Oral  SpO2:   99%   Weight:    187 lb 8 oz (85 kg)  Height:       Weight change: -4 lb 3 oz (-1.9 kg)  Intake/Output Summary (Last 24 hours) at 06/17/16 0729 Last data filed at 06/17/16 0618  Gross per 24 hour  Intake           1314.4 ml  Output             1350 ml  Net            -35.6 ml     Exam: Heart:: Regular rate and rhythm, S1S2 present or without murmur or extra heart sounds Lungs: normal, clear to auscultation and clear to auscultation and percussion Abdomen: soft, nontender, normal bowel sounds   Lab Results: CBC Latest Ref Rng & Units 06/17/2016 06/16/2016 06/16/2016  WBC 3.8 - 10.6 K/uL 7.5 7.3 7.4  Hemoglobin 13.0 - 18.0 g/dL 7.4(L) 6.8(L) 7.1(L)  Hematocrit 40.0 - 52.0 % 22.8(L) 21.1(L) 21.6(L)  Platelets 150 - 440 K/uL 230 244 257    Micro Results: No results found for this or any previous visit (from the past 240 hour(s)). Studies/Results: No results found. Medications: I have reviewed the patient's current medications. Scheduled Meds: . sodium chloride   Intravenous Once  . amLODipine  5 mg Oral Daily  . furosemide  80 mg Oral Daily  . gabapentin  300 mg Oral QHS  . HYDROcodone-acetaminophen  1 tablet Oral Once  . insulin aspart  0-9 Units Subcutaneous TID WC  . labetalol  100 mg Oral Daily  . mometasone-formoterol  2 puff Inhalation BID  . [START ON 06/18/2016] pantoprazole  40 mg Intravenous Q12H  . sevelamer carbonate   2,400 mg Oral TID WC  . sodium chloride flush  3 mL Intravenous Q12H  . sodium chloride flush  3 mL Intravenous Q12H   Continuous Infusions: . pantoprozole (PROTONIX) infusion 8 mg/hr (06/15/16 1741)   PRN Meds:.sodium chloride, acetaminophen **OR** acetaminophen, albuterol, HYDROcodone-acetaminophen, ondansetron **OR** ondansetron (ZOFRAN) IV, sodium chloride flush   Assessment: Active Problems:   Rectal bleeding  Stanley [name] is a 56 y.o. y/o male with a history of overt obscure GI bleeding evaluated in the past  , prior small avm of the proximal jejunum comes in with rectal bleeding. EGD was normal  Iron is  low- suggest dose of IV iron, iron studies show a picture of iron deficiency with anemia of chronic disease.   Last colonoscopy  2016 by Dr Candace Cruise  and the withdrawal time was only 3 minutes which is a very rapid procedure and more likely to miss lesions. Hb on 06/15/16 was 4.6 grams , MCV 84   Plan: 1. IV iron  2. Colonoscopy tomorrow and if negative and no overt bleeding after bowel prep can have a repeat capsule study as an outpatient, History of  CKD puts him at higher chances of small bowel AVM's . No lab evidence so far to suggest hemolysis.    LOS: 3 days   [name] 06/17/2016, 7:29 AM

## 2016-06-17 NOTE — Progress Notes (Signed)
[facility]  ROUNDING NOTE   Subjective:  Patient reports abdominal distention today. He completed hemodialysis yesterday. Hemoglobin currently up to 7.4.  Objective:  Vital signs in last 24 hours:  Temp:  [97.5 F (36.4 C)-98.3 F (36.8 C)] 98.1 F (36.7 C) (04/06 0753) Pulse Rate:  [71-91] 74 (04/06 1001) Resp:  [14-19] 14 (04/06 0753) BP: (101-137)/(56-90) 119/71 (04/06 1001) SpO2:  [94 %-100 %] 100 % (04/06 1001) Weight:  [85 kg (187 lb 8 oz)-85.8 kg (189 lb 2.5 oz)] 85 kg (187 lb 8 oz) (04/06 0612)  Weight change: -1.9 kg (-4 lb 3 oz) Filed Weights   06/16/16 0500 06/16/16 1500 06/17/16 0612  Weight: 85.8 kg (189 lb 3.2 oz) 85.8 kg (189 lb 2.5 oz) 85 kg (187 lb 8 oz)    Intake/Output: I/O last 3 completed shifts: In: 1877.6 [P.O.:240; I.V.:1267.4; Blood:370.2] Out: 1350 [Urine:350; Other:1000]   Intake/Output this shift:  No intake/output data recorded.  Physical Exam: General: No acute distress  Head: Normocephalic, atraumatic. Moist oral mucosal membranes  Eyes: Anicteric  Neck: Supple, trachea midline  Lungs:  Clear to auscultation, normal effort  Heart: S1S2 no rubs  Abdomen:  Soft, nontender, mild distension  Extremities: Trace peripheral edema.  Neurologic: Nonfocal, moving all four extremities  Skin: No lesions  Access: RUE AVF    Basic Metabolic Panel:  Recent Labs Lab 06/14/16 1228 06/14/16 2028 06/15/16 0147 06/15/16 1300 06/16/16 0519 06/16/16 1515 06/17/16 0524  NA 138  --  139  --  138 138 136  K 5.6*  --  5.4*  --  4.9 5.1 4.1  CL 103  --  105  --  103 103 97*  CO2 20*  --  22  --  27 24 29   GLUCOSE 121*  --  107*  --  101* 113* 86  BUN 100*  --  77*  --  57* 57* 39*  CREATININE 11.27*  --  8.78*  --  7.11* 8.10* 5.99*  CALCIUM 8.5*  --  8.2*  --  8.3* 8.3* 8.1*  PHOS  --  3.5  --  2.3*  --  5.0*  5.2*  --     Liver Function Tests:  Recent Labs Lab 06/14/16 1228 06/16/16 1515  AST 28  --   ALT 20  --    ALKPHOS 147*  --   BILITOT 0.8  --   PROT 7.3  --   ALBUMIN 3.2* 2.9*   No results for input(s): LIPASE, AMYLASE in the last 168 hours. No results for input(s): AMMONIA in the last 168 hours.  CBC:  Recent Labs Lab 06/14/16 1228 06/15/16 0147 06/15/16 1300 06/15/16 2025 06/16/16 0519 06/16/16 1515 06/17/16 0524  WBC 8.4 8.6 7.1  --  7.4 7.3 7.5  NEUTROABS 6.5  --   --   --   --   --   --   HGB 5.8* 4.6* 6.4* 7.1* 7.1* 6.8* 7.4*  HCT 18.6* 14.4* 19.5* 21.6* 21.6* 21.1* 22.8*  MCV 84.8 84.3 81.8  --  82.9 84.1 83.0  PLT 343 276 288  --  257 244 230    Cardiac Enzymes: No results for input(s): CKTOTAL, CKMB, CKMBINDEX, TROPONINI in the last 168 hours.  BNP: Invalid input(s): POCBNP  CBG:  Recent Labs Lab 06/15/16 1656 06/15/16 2116 06/16/16 0807 06/16/16 2132 06/17/16 0814  GLUCAP 132* 105* 89 104* 81    Microbiology: Results for orders placed or performed during the hospital encounter of 05/30/16  MRSA PCR Screening     Status: Abnormal   Collection Time: 05/31/16  6:45 AM  Result Value Ref Range Status   MRSA by PCR POSITIVE (A) NEGATIVE Final    Comment:        The GeneXpert MRSA Assay (FDA approved for NASAL specimens only), is one component of a comprehensive MRSA colonization surveillance program. It is not intended to diagnose MRSA infection nor to guide or monitor treatment for MRSA infections. RESULT CALLED TO, READ BACK BY AND VERIFIED WITH:  BRAD CHISMON AT 6387 05/31/16 SDR   Body fluid culture     Status: None   Collection Time: 05/31/16  3:20 PM  Result Value Ref Range Status   Specimen Description PERITONEAL  Final   Special Requests NONE  Final   Gram Stain   Final    FEW WBC PRESENT, PREDOMINANTLY MONONUCLEAR NO ORGANISMS SEEN    Culture   Final    NO GROWTH 3 DAYS Performed at [facility], [address], [place], [place] [address]    Report Status 06/04/2016 FINAL  Final    Coagulation Studies:  Recent Labs   06/14/16 1228  LABPROT 15.0  INR 1.17    Urinalysis: No results for input(s): COLORURINE, LABSPEC, PHURINE, GLUCOSEU, HGBUR, BILIRUBINUR, KETONESUR, PROTEINUR, UROBILINOGEN, NITRITE, LEUKOCYTESUR in the last 72 hours.  Invalid input(s): APPERANCEUR    Imaging: No results found.   Medications:   . pantoprozole (PROTONIX) infusion 8 mg/hr (06/15/16 1741)   . sodium chloride   Intravenous Once  . amLODipine  5 mg Oral Daily  . furosemide  80 mg Oral Daily  . gabapentin  300 mg Oral QHS  . HYDROcodone-acetaminophen  1 tablet Oral Once  . insulin aspart  0-9 Units Subcutaneous TID WC  . labetalol  100 mg Oral Daily  . mometasone-formoterol  2 puff Inhalation BID  . [START ON 06/18/2016] pantoprazole  40 mg Intravenous Q12H  . polyethylene glycol-electrolytes  4,000 mL Oral Once  . sevelamer carbonate  2,400 mg Oral TID WC  . sodium chloride flush  3 mL Intravenous Q12H  . sodium chloride flush  3 mL Intravenous Q12H   sodium chloride, acetaminophen **OR** acetaminophen, albuterol, HYDROcodone-acetaminophen, ondansetron **OR** ondansetron (ZOFRAN) IV, sodium chloride flush  Assessment/ Plan:  56 y.o. male with ESRD on hemodialysis, hypertension, peripheral vascular disease, coronary artery disease, cirrhosis, substance abuse, recurrent GI bleed  TTS [address] Right arm AVF   1. End stage renal disease with hyperkalemia: Patient completed hemodialysis yesterday. We will plan for dialysis again tomorrow if still here.  2. Secondary Hyperparathyroidism:  Phosphorus currently 5.2. Continue Renvela 3 tablets by mouth 3 times a day with meals.  3. Hypertension:  Blood pressure currently 119/71. Continue labetalol and amlodipine.  4. Anemia of chronic kidney disease with GI bleed and anemia blood loss. Hemoglobin currently up to 7.4. Monitor CBC closely.   LOS: 3 Helayna Dun 4/6/201810:46 AM

## 2016-06-18 ENCOUNTER — Encounter
Admission: EM | Disposition: A | Discharge status: Home / Self Care | Payer: Self-pay | Source: Home / Self Care | Attending: Internal Medicine

## 2016-06-18 ENCOUNTER — Encounter: Payer: Self-pay | Admitting: Anesthesiology

## 2016-06-18 ENCOUNTER — Inpatient Hospital Stay: Payer: Medicare Other | Admitting: Anesthesiology

## 2016-06-18 ENCOUNTER — Other Ambulatory Visit: Payer: Self-pay | Admitting: Gastroenterology

## 2016-06-18 HISTORY — PX: COLONOSCOPY WITH PROPOFOL: SHX5780

## 2016-06-18 LAB — CBC
HCT: 25.8 % — ABNORMAL LOW (ref 40.0–52.0)
HEMOGLOBIN: 8.4 g/dL — AB (ref 13.0–18.0)
MCH: 27.2 pg (ref 26.0–34.0)
MCHC: 32.7 g/dL (ref 32.0–36.0)
MCV: 83.1 fL (ref 80.0–100.0)
PLATELETS: 241 10*3/uL (ref 150–440)
RBC: 3.1 MIL/uL — AB (ref 4.40–5.90)
RDW: 16.3 % — ABNORMAL HIGH (ref 11.5–14.5)
WBC: 8.9 10*3/uL (ref 3.8–10.6)

## 2016-06-18 LAB — BASIC METABOLIC PANEL
ANION GAP: 11 (ref 5–15)
BUN: 45 mg/dL — AB (ref 6–20)
CHLORIDE: 99 mmol/L — AB (ref 101–111)
CO2: 28 mmol/L (ref 22–32)
Calcium: 8.3 mg/dL — ABNORMAL LOW (ref 8.9–10.3)
Creatinine, Ser: 6.95 mg/dL — ABNORMAL HIGH (ref 0.61–1.24)
GFR calc Af Amer: 9 mL/min — ABNORMAL LOW (ref >60)
GFR, EST NON AFRICAN AMERICAN: 8 mL/min — AB (ref >60)
Glucose, Bld: 89 mg/dL (ref 65–99)
POTASSIUM: 4.6 mmol/L (ref 3.5–5.1)
SODIUM: 138 mmol/L (ref 135–145)

## 2016-06-18 LAB — TYPE AND SCREEN
ABO/RH(D): A POS
ANTIBODY SCREEN: NEGATIVE
UNIT DIVISION: 0
Unit division: 0
Unit division: 0
Unit division: 0
Unit division: 0

## 2016-06-18 LAB — GLUCOSE, CAPILLARY
GLUCOSE-CAPILLARY: 81 mg/dL (ref 65–99)
Glucose-Capillary: 72 mg/dL (ref 65–99)
Glucose-Capillary: 74 mg/dL (ref 65–99)
Glucose-Capillary: 92 mg/dL (ref 65–99)

## 2016-06-18 LAB — BPAM RBC
BLOOD PRODUCT EXPIRATION DATE: 201804252359
BLOOD PRODUCT EXPIRATION DATE: 201804262359
Blood Product Expiration Date: 201804242359
Blood Product Expiration Date: 201804262359
Blood Product Expiration Date: 201804262359
ISSUE DATE / TIME: 201804040257
ISSUE DATE / TIME: 201804061505
ISSUE DATE / TIME: 201804040548
ISSUE DATE / TIME: 201804041529
ISSUE DATE / TIME: 201804052338
UNIT TYPE AND RH: 6200
UNIT TYPE AND RH: 6200
Unit Type and Rh: 5100
Unit Type and Rh: 6200
Unit Type and Rh: 6200

## 2016-06-18 SURGERY — COLONOSCOPY WITH PROPOFOL
Anesthesia: General

## 2016-06-18 MED ORDER — MORPHINE SULFATE (PF) 2 MG/ML IV SOLN
2.0000 mg | INTRAVENOUS | Status: DC | PRN
  Administered 2016-06-18 – 2016-06-19 (×7): 2 mg via INTRAVENOUS
  Filled 2016-06-18 (×7): qty 1

## 2016-06-18 MED ORDER — PROPOFOL 10 MG/ML IV BOLUS
INTRAVENOUS | Status: DC | PRN
  Administered 2016-06-18 (×3): 50 mg via INTRAVENOUS
  Administered 2016-06-18: 40 mg via INTRAVENOUS
  Administered 2016-06-18 (×3): 50 mg via INTRAVENOUS

## 2016-06-18 MED ORDER — EPHEDRINE SULFATE 50 MG/ML IJ SOLN
INTRAMUSCULAR | Status: DC | PRN
Start: 2016-06-18 — End: 2016-06-18
  Administered 2016-06-18 (×2): 25 mg via INTRAVENOUS

## 2016-06-18 MED ORDER — EPOETIN ALFA 10000 UNIT/ML IJ SOLN: 10000.0000 [IU] | INTRAMUSCULAR | Status: DC

## 2016-06-18 MED ORDER — PROPOFOL 500 MG/50ML IV EMUL
INTRAVENOUS | Status: AC
  Filled 2016-06-18: qty 50

## 2016-06-18 MED ORDER — EPOETIN ALFA 10000 UNIT/ML IJ SOLN
10000.0000 [IU] | INTRAMUSCULAR | Status: DC
  Administered 2016-06-18: 10000 [IU] via INTRAVENOUS

## 2016-06-18 MED ORDER — EPHEDRINE SULFATE 50 MG/ML IJ SOLN
INTRAMUSCULAR | Status: AC
  Filled 2016-06-18: qty 1

## 2016-06-18 MED ORDER — PEG 3350-KCL-NA BICARB-NACL 420 G PO SOLR
4000.0000 mL | Freq: Once | ORAL | Status: AC
  Administered 2016-06-18: 4000 mL via ORAL
  Filled 2016-06-18: qty 4000

## 2016-06-18 NOTE — Anesthesia Preprocedure Evaluation (Addendum)
Anesthesia Evaluation  Patient identified by MRN, date of birth, ID band Patient awake    Reviewed: Allergy & Precautions, NPO status , Patient's Chart, lab work & pertinent test results, reviewed documented beta blocker date and time   Airway Mallampati: II  TM Distance: >3 FB     Dental  (+) Chipped   Pulmonary shortness of breath, Current Smoker,           Cardiovascular hypertension, Pt. on medications + CAD, + CABG and + Peripheral Vascular Disease       Neuro/Psych  Neuromuscular disease    GI/Hepatic GERD  ,(+) Cirrhosis   ascites    ,   Endo/Other  diabetes, Type 2  Renal/GU ESRFRenal disease     Musculoskeletal   Abdominal   Peds  Hematology  (+) anemia ,   Anesthesia Other Findings ascitis. ETOH abuse.  Reproductive/Obstetrics                            Anesthesia Physical Anesthesia Plan  ASA: IV  Anesthesia Plan: General   Post-op Pain Management:    Induction: Intravenous  Airway Management Planned:   Additional Equipment:   Intra-op Plan:   Post-operative Plan:   Informed Consent: I have reviewed the patients History and Physical, chart, labs and discussed the procedure including the risks, benefits and alternatives for the proposed anesthesia with the patient or authorized representative who has indicated his/her understanding and acceptance.     Plan Discussed with: CRNA  Anesthesia Plan Comments:         Anesthesia Quick Evaluation

## 2016-06-18 NOTE — Progress Notes (Signed)
Pre HD Assessment  

## 2016-06-18 NOTE — Anesthesia Postprocedure Evaluation (Signed)
Anesthesia Post Note  Patient: Stanley [name]  Procedure(s) Performed: Procedure(s) (LRB): COLONOSCOPY WITH PROPOFOL (N/A)  Patient location during evaluation: Endoscopy Anesthesia Type: General Level of consciousness: awake and alert Pain management: pain level controlled Vital Signs Assessment: post-procedure vital signs reviewed and stable Respiratory status: spontaneous breathing, nonlabored ventilation, respiratory function stable and patient connected to nasal cannula oxygen Cardiovascular status: blood pressure returned to baseline and stable Postop Assessment: no signs of nausea or vomiting Anesthetic complications: no     Last Vitals:  Vitals:   06/18/16 0915 06/18/16 0925  BP: 117/75 114/77  Pulse: 76 78  Resp: 16 16  Temp:      Last Pain:  Vitals:   06/18/16 0855  TempSrc: Tympanic  PainSc:                  Athaliah Baumbach S

## 2016-06-18 NOTE — Progress Notes (Signed)
HD ended early per patient d/t having to use restroom frequently. Total UF 1.3L just shy of 1.5L goal. Patient currently has no complaints.

## 2016-06-18 NOTE — Progress Notes (Signed)
[facility]  ROUNDING NOTE   Subjective:  Patient due for hemodialysis today. He had colonoscopy today which did not show obvious source of bleeding.   Objective:  Vital signs in last 24 hours:  Temp:  [97.4 F (36.3 C)-98.7 F (37.1 C)] 97.7 F (36.5 C) (04/07 1352) Pulse Rate:  [67-87] 67 (04/07 1403) Resp:  [12-18] 12 (04/07 1403) BP: (106-136)/(63-84) 120/77 (04/07 1403) SpO2:  [94 %-100 %] 95 % (04/07 1403) Weight:  [87.8 kg (193 lb 9.6 oz)] 87.8 kg (193 lb 9.6 oz) (04/07 0500)  Weight change: 2.017 kg (4 lb 7.1 oz) Filed Weights   06/16/16 1500 06/17/16 0612 06/18/16 0500  Weight: 85.8 kg (189 lb 2.5 oz) 85 kg (187 lb 8 oz) 87.8 kg (193 lb 9.6 oz)    Intake/Output: I/O last 3 completed shifts: In: 1769.4 [P.O.:460; I.V.:539.2; Blood:770.2] Out: 350 [Urine:350]   Intake/Output this shift:  Total I/O In: 150 [I.V.:150] Out: -   Physical Exam: General: No acute distress  Head: Normocephalic, atraumatic. Moist oral mucosal membranes  Eyes: Anicteric  Neck: Supple, trachea midline  Lungs:  Clear to auscultation, normal effort  Heart: S1S2 no rubs  Abdomen:  Soft, nontender, mild distension  Extremities: Trace peripheral edema.  Neurologic: Nonfocal, moving all four extremities  Skin: No lesions  Access: RUE AVF    Basic Metabolic Panel:  Recent Labs Lab 06/14/16 2028 06/15/16 0147 06/15/16 1300 06/16/16 0519 06/16/16 1515 06/17/16 0524 06/18/16 0302  NA  --  139  --  138 138 136 138  K  --  5.4*  --  4.9 5.1 4.1 4.6  CL  --  105  --  103 103 97* 99*  CO2  --  22  --  27 24 29 28   GLUCOSE  --  107*  --  101* 113* 86 89  BUN  --  77*  --  57* 57* 39* 45*  CREATININE  --  8.78*  --  7.11* 8.10* 5.99* 6.95*  CALCIUM  --  8.2*  --  8.3* 8.3* 8.1* 8.3*  PHOS 3.5  --  2.3*  --  5.0*  5.2*  --   --     Liver Function Tests:  Recent Labs Lab 06/14/16 1228 06/16/16 1515  AST 28  --   ALT 20  --   ALKPHOS 147*  --   BILITOT 0.8  --    PROT 7.3  --   ALBUMIN 3.2* 2.9*   No results for input(s): LIPASE, AMYLASE in the last 168 hours. No results for input(s): AMMONIA in the last 168 hours.  CBC:  Recent Labs Lab 06/14/16 1228  06/15/16 1300 06/15/16 2025 06/16/16 0519 06/16/16 1515 06/17/16 0524 06/18/16 0302  WBC 8.4  < > 7.1  --  7.4 7.3 7.5 8.9  NEUTROABS 6.5  --   --   --   --   --   --   --   HGB 5.8*  < > 6.4* 7.1* 7.1* 6.8* 7.4* 8.4*  HCT 18.6*  < > 19.5* 21.6* 21.6* 21.1* 22.8* 25.8*  MCV 84.8  < > 81.8  --  82.9 84.1 83.0 83.1  PLT 343  < > 288  --  257 244 230 241  < > = values in this interval not displayed.  Cardiac Enzymes: No results for input(s): CKTOTAL, CKMB, CKMBINDEX, TROPONINI in the last 168 hours.  BNP: Invalid input(s): POCBNP  CBG:  Recent Labs Lab 06/17/16 0814 06/17/16 1133  06/17/16 1733 06/17/16 2130 06/18/16 1153  GLUCAP 81 120* 105* 106* 64    Microbiology: Results for orders placed or performed during the hospital encounter of 05/30/16  MRSA PCR Screening     Status: Abnormal   Collection Time: 05/31/16  6:45 AM  Result Value Ref Range Status   MRSA by PCR POSITIVE (A) NEGATIVE Final    Comment:        The GeneXpert MRSA Assay (FDA approved for NASAL specimens only), is one component of a comprehensive MRSA colonization surveillance program. It is not intended to diagnose MRSA infection nor to guide or monitor treatment for MRSA infections. RESULT CALLED TO, READ BACK BY AND VERIFIED WITH:  BRAD CHISMON AT 7425 05/31/16 SDR   Body fluid culture     Status: None   Collection Time: 05/31/16  3:20 PM  Result Value Ref Range Status   Specimen Description PERITONEAL  Final   Special Requests NONE  Final   Gram Stain   Final    FEW WBC PRESENT, PREDOMINANTLY MONONUCLEAR NO ORGANISMS SEEN    Culture   Final    NO GROWTH 3 DAYS Performed at [facility], [address] [place], [place] [address]    Report Status 06/04/2016 FINAL  Final     Coagulation Studies: No results for input(s): LABPROT, INR in the last 72 hours.  Urinalysis: No results for input(s): COLORURINE, LABSPEC, PHURINE, GLUCOSEU, HGBUR, BILIRUBINUR, KETONESUR, PROTEINUR, UROBILINOGEN, NITRITE, LEUKOCYTESUR in the last 72 hours.  Invalid input(s): APPERANCEUR    Imaging: No results found.   Medications:    . sodium chloride   Intravenous Once  . amLODipine  5 mg Oral Daily  . furosemide  80 mg Oral Daily  . gabapentin  300 mg Oral QHS  . HYDROcodone-acetaminophen  1 tablet Oral Once  . insulin aspart  0-9 Units Subcutaneous TID WC  . labetalol  100 mg Oral Daily  . mometasone-formoterol  2 puff Inhalation BID  . pantoprazole  40 mg Oral QAC breakfast  . sevelamer carbonate  2,400 mg Oral TID WC  . sodium chloride flush  3 mL Intravenous Q12H  . sodium chloride flush  3 mL Intravenous Q12H   sodium chloride, acetaminophen **OR** acetaminophen, albuterol, HYDROcodone-acetaminophen, morphine injection, ondansetron **OR** ondansetron (ZOFRAN) IV, sodium chloride flush  Assessment/ Plan:  56 y.o. male with ESRD on hemodialysis, hypertension, peripheral vascular disease, coronary artery disease, cirrhosis, substance abuse, recurrent GI bleed  TTS [address] Right arm AVF   1. End stage renal disease with hyperkalemia: We will plan for hemodialysis today. Potassium now corrected.  2. Secondary Hyperparathyroidism:  Recheck phosphorus now. Continue Renvela 3 tablets by mouth 3 times a day with meals.  3. Hypertension:  Blood pressure well controlled at 120/77. Continue labetalol and amlodipine.  4. Anemia of chronic kidney disease with GI bleed and anemia blood loss. Hemoglobin up to 8.4 post trans-fusion. We will start the patient on Epogen 10,000 units IV with dialysis.   LOS: [place], [place] 4/7/20182:14 PM

## 2016-06-18 NOTE — Transfer of Care (Signed)
Immediate Anesthesia Transfer of Care Note  Patient: Stanley [name]  Procedure(s) Performed: Procedure(s): COLONOSCOPY WITH PROPOFOL (N/A)  Patient Location: PACU and Endoscopy Unit  Anesthesia Type:General  Level of Consciousness: sedated  Airway & Oxygen Therapy: Patient Spontanous Breathing and Patient connected to nasal cannula oxygen  Post-op Assessment: Report given to RN and Post -op Vital signs reviewed and stable  Post vital signs: Reviewed and stable  Last Vitals:  Vitals:   06/18/16 0559 06/18/16 0750  BP: 125/71 136/75  Pulse: 72 80  Resp: 18 16  Temp: 37.1 C 36.4 C    Last Pain:  Vitals:   06/18/16 0750  TempSrc: Tympanic  PainSc:       Patients Stated Pain Goal: 0 (70/96/43 8381)  Complications: No apparent anesthesia complications

## 2016-06-18 NOTE — Progress Notes (Signed)
HD initiated via R AVF without issue. No heparin treatment.

## 2016-06-18 NOTE — Progress Notes (Signed)
[place] [facility] [place] NAME: Stanley [name]    MR#:  [id number]  DATE OF BIRTH:  10/30/1960  SUBJECTIVE:  CHIEF COMPLAINT:   Chief Complaint  Patient presents with  . Rectal Bleeding  no further bleed, Complains of pain in his legs due to neuropathy REVIEW OF SYSTEMS:  Review of Systems  Constitutional: Negative for chills, fever and weight loss.  HENT: Negative for nosebleeds and sore throat.   Eyes: Negative for blurred vision.  Respiratory: Negative for cough, shortness of breath and wheezing.   Cardiovascular: Negative for chest pain, orthopnea, leg swelling and PND.  Gastrointestinal: Positive for blood in stool. Negative for abdominal pain, constipation, diarrhea, heartburn, nausea and vomiting.  Genitourinary: Negative for dysuria and urgency.  Musculoskeletal: Negative for back pain.  Skin: Negative for rash.  Neurological: Negative for dizziness, speech change, focal weakness and headaches.  Endo/Heme/Allergies: Does not bruise/bleed easily.  Psychiatric/Behavioral: Negative for depression.   DRUG ALLERGIES:  No Known Allergies VITALS:  Blood pressure 114/77, pulse 78, temperature 97.4 F (36.3 C), temperature source Tympanic, resp. rate 16, height 6\' 3"  (1.905 m), weight 193 lb 9.6 oz (87.8 kg), SpO2 100 %. PHYSICAL EXAMINATION:  Physical Exam  Constitutional: He is oriented to person, place, and time and well-developed, well-nourished, and in no distress.  HENT:  Head: Normocephalic and atraumatic.  Eyes: Conjunctivae and EOM are normal. Pupils are equal, round, and reactive to light.  Neck: Normal range of motion. Neck supple. No tracheal deviation present. No thyromegaly present.  Cardiovascular: Normal rate, regular rhythm and normal heart sounds.   Pulmonary/Chest: Effort normal and breath sounds normal. No respiratory distress. He has no wheezes. He exhibits no tenderness.  Abdominal: Soft. Bowel sounds are normal.  He exhibits no distension. There is no tenderness.  Musculoskeletal: Normal range of motion.  Neurological: He is alert and oriented to person, place, and time. No cranial nerve deficit.  Skin: Skin is warm and dry. No rash noted.  Psychiatric: Mood and affect normal.   LABORATORY PANEL:  Male CBC  Recent Labs Lab 06/18/16 0302  WBC 8.9  HGB 8.4*  HCT 25.8*  PLT 241   ------------------------------------------------------------------------------------------------------------------ Chemistries   Recent Labs Lab 06/14/16 1228  06/18/16 0302  NA 138  < > 138  K 5.6*  < > 4.6  CL 103  < > 99*  CO2 20*  < > 28  GLUCOSE 121*  < > 89  BUN 100*  < > 45*  CREATININE 11.27*  < > 6.95*  CALCIUM 8.5*  < > 8.3*  AST 28  --   --   ALT 20  --   --   ALKPHOS 147*  --   --   BILITOT 0.8  --   --   < > = values in this interval not displayed. RADIOLOGY:  No results found. ASSESSMENT AND PLAN:   * Rectal bleeding - Likely acute on chronic blood loss. Patient had incomplete colonoscopy due to poor prep. Further recommendations as per GI  * End stage renal disease on hemodialysis: per Nephro.  * Hyperkalemia: resolved with HD  * Hypertension. Continue home medications.  * Alcoholic cirrhosis. Normal platelets and INR.  * DVT prophylaxis with SCDs.     All the records are reviewed and case discussed with Care Management/Social Worker. Management plans discussed with the patient, family and they are in agreement.  CODE STATUS: Full Code  TOTAL TIME TAKING CARE OF  THIS PATIENT: 25 minutes.   More than 50% of the time was spent in counseling/coordination of care: YES  POSSIBLE D/C IN 1-2 DAYS, DEPENDING ON CLINICAL CONDITION. And GI eval   Dustin Flock M.D on 06/18/2016 at 12:41 PM  Between 7am to 6pm - Pager - [phone]  After 6pm go to [email or web address] - Proofreader  [facility]  Office  [phone]  CC: Primary care  physician; Elyse Jarvis, MD  Note: This dictation was prepared with Dragon dictation along with smaller phrase technology. Any transcriptional errors that result from this process are unintentional.

## 2016-06-18 NOTE — Progress Notes (Signed)
Post HD assessment unchanged  

## 2016-06-18 NOTE — Op Note (Signed)
[facility] Patient Name: Stanley [name] Procedure Date: 06/18/2016 8:17 AM MRN: [id number] Account #: [id number] Date of Birth: 1961-02-10 Admit Type: Inpatient Age: 56 Room: [facility] ENDO ROOM 4 Gender: Male Note Status: Finalized Procedure:            Colonoscopy Indications:          Hematochezia Providers:            Lisbeth Renshaw MD, MD Medicines:            Propofol per Anesthesia Complications:        No immediate complications. Procedure:            Pre-Anesthesia Assessment:                       - Prior to the procedure, a History and Physical was                        performed, and patient medications and allergies were                        reviewed. The patient's tolerance of previous                        anesthesia was also reviewed. The risks and benefits of                        the procedure and the sedation options and risks were                        discussed with the patient. All questions were                        answered, and informed consent was obtained. Prior                        Anticoagulants: The patient has taken aspirin, last                        dose was 1 day prior to procedure. ASA Grade                        Assessment: III - A patient with severe systemic                        disease. After reviewing the risks and benefits, the                        patient was deemed in satisfactory condition to undergo                        the procedure.                       After obtaining informed consent, the colonoscope was                        passed under direct vision. Throughout the procedure,                        the patient's blood pressure, pulse,  and oxygen                        saturations were monitored continuously. The                        Colonoscope was introduced through the anus and                        advanced to the the cecum, identified by the ileocecal   valve. The colonoscopy was performed without                        difficulty. The patient tolerated the procedure well.                        The quality of the bowel preparation was inadequate.                        The ileocecal valve and the rectum were photographed. Findings:      The perianal exam findings include skin tags.      The digital rectal exam was normal.      Semi-liquid stool was found in the entire colon, precluding       visualization. Lavage of the area was performed using a large amount,       resulting in incomplete clearance with fair visualization.      A 7 mm polyp was found in the transverse colon. The polyp was sessile.       The polyp was removed with a cold snare. Resection and retrieval were       complete.      A few small-mouthed diverticula were found in the sigmoid colon.      Internal hemorrhoids were found during retroflexion.      TOO MUCH STOOL THAT COULD NOT BE LAVAGED DUE TO SOLID PARTICLES TO       INTUBATE THE TI, VISUALIZE THE APPENDICEAL ORIFACE OR VISUALIZE THE       ENTIRE COLON, so cannot out rule out a small, inactive, AVM or other       bleeding lesion Impression:           - Preparation of the colon was inadequate.                       - Perianal skin tags found on perianal exam.                       - Stool in the entire examined colon.                       - One 7 mm polyp in the transverse colon, removed with                        a cold snare. Resected and retrieved.                       - Diverticulosis in the sigmoid colon.                       - Internal hemorrhoids.  NO SOURCE OF GI BLEEDING IDENTIFIED Recommendation:       - Return patient to hospital ward for ongoing care.                       - Clear liquid diet today with additional golytely prep                        until patient's stool is clear like urine                       - NPO past midnight except for prep as needed                        - Video capsule to be dropped in the morning                       - Continue present medications.                       - Await pathology results.                       - Repeat colonoscopy in 1 year for surveillance, as                        this was not an adequate exam for a screening or                        surveillance colonoscopy and he clearly forms polyps Procedure Code(s):    --- Professional ---                       [phone], Colonoscopy, flexible; with removal of tumor(s),                        polyp(s), or other lesion(s) by snare technique Diagnosis Code(s):    --- Professional ---                       D12.3, Benign neoplasm of transverse colon (hepatic                        flexure or splenic flexure)                       K64.4, Residual hemorrhoidal skin tags                       K92.1, Melena (includes Hematochezia) CPT copyright 2016 American Medical Association. All rights reserved. The codes documented in this report are preliminary and upon coder review may  be revised to meet current compliance requirements. Dr. Lisbeth Renshaw Lisbeth Renshaw MD, MD 06/18/2016 9:19:28 AM This report has been signed electronically. Number of Addenda: 0 Note Initiated On: 06/18/2016 8:17 AM Scope Withdrawal Time: 0 hours 13 minutes 18 seconds  Total Procedure Duration: 0 hours 23 minutes 10 seconds  Estimated Blood Loss: Estimated blood loss: none.      [facility]

## 2016-06-18 NOTE — Anesthesia Post-op Follow-up Note (Cosign Needed)
Anesthesia QCDR form completed.        

## 2016-06-18 NOTE — Progress Notes (Signed)
Pre HD  

## 2016-06-19 ENCOUNTER — Encounter
Admission: EM | Disposition: A | Discharge status: Home / Self Care | Payer: Self-pay | Source: Home / Self Care | Attending: Internal Medicine

## 2016-06-19 HISTORY — PX: AGILE CAPSULE: SHX5420

## 2016-06-19 LAB — CBC
HEMATOCRIT: 28.1 % — AB (ref 40.0–52.0)
Hemoglobin: 9.1 g/dL — ABNORMAL LOW (ref 13.0–18.0)
MCH: 27 pg (ref 26.0–34.0)
MCHC: 32.3 g/dL (ref 32.0–36.0)
MCV: 83.6 fL (ref 80.0–100.0)
Platelets: 259 10*3/uL (ref 150–440)
RBC: 3.37 MIL/uL — ABNORMAL LOW (ref 4.40–5.90)
RDW: 16.5 % — ABNORMAL HIGH (ref 11.5–14.5)
WBC: 6.1 10*3/uL (ref 3.8–10.6)

## 2016-06-19 LAB — GLUCOSE, CAPILLARY
GLUCOSE-CAPILLARY: 83 mg/dL (ref 65–99)
Glucose-Capillary: 97 mg/dL (ref 65–99)

## 2016-06-19 LAB — PREPARE RBC (CROSSMATCH)

## 2016-06-19 SURGERY — AGILE CAPSULE

## 2016-06-19 NOTE — Progress Notes (Signed)
Patient alert and orien .   ted; denies pain at this time; Capsule study today; patient tolerating diet this afternoon; patient states his living today; MD notified.

## 2016-06-19 NOTE — Progress Notes (Signed)
[facility]  ROUNDING NOTE   Subjective:  Patient seen at bedside. He is undergoing capsule endoscopy at the moment. Completed hemodialysis yesterday.  Objective:  Vital signs in last 24 hours:  Temp:  [97.5 F (36.4 C)-98.2 F (36.8 C)] 98.2 F (36.8 C) (04/08 0535) Pulse Rate:  [67-84] 73 (04/08 0535) Resp:  [12-20] 16 (04/08 0535) BP: (120-142)/(73-84) 140/79 (04/08 0535) SpO2:  [94 %-99 %] 99 % (04/08 0535) Weight:  [84.8 kg (186 lb 15.2 oz)-88.8 kg (195 lb 11.2 oz)] 88.8 kg (195 lb 11.2 oz) (04/08 0500)  Weight change: -1.716 kg (-3 lb 12.5 oz) Filed Weights   06/18/16 1352 06/18/16 1700 06/19/16 0500  Weight: 86.1 kg (189 lb 13.1 oz) 84.8 kg (186 lb 15.2 oz) 88.8 kg (195 lb 11.2 oz)    Intake/Output: I/O last 3 completed shifts: In: 150 [I.V.:150] Out: 1409 [Urine:100; ZYSAY:3016]   Intake/Output this shift:  No intake/output data recorded.  Physical Exam: General: No acute distress  Head: Normocephalic, atraumatic. Moist oral mucosal membranes  Eyes: Anicteric  Neck: Supple, trachea midline  Lungs:  Clear to auscultation, normal effort  Heart: S1S2 no rubs  Abdomen:  Soft, nontender, mild distension  Extremities: Trace peripheral edema.  Neurologic: Nonfocal, moving all four extremities  Skin: No lesions  Access: RUE AVF    Basic Metabolic Panel:  Recent Labs Lab 06/14/16 2028 06/15/16 0147 06/15/16 1300 06/16/16 0519 06/16/16 1515 06/17/16 0524 06/18/16 0302  NA  --  139  --  138 138 136 138  K  --  5.4*  --  4.9 5.1 4.1 4.6  CL  --  105  --  103 103 97* 99*  CO2  --  22  --  27 24 29 28   GLUCOSE  --  107*  --  101* 113* 86 89  BUN  --  77*  --  57* 57* 39* 45*  CREATININE  --  8.78*  --  7.11* 8.10* 5.99* 6.95*  CALCIUM  --  8.2*  --  8.3* 8.3* 8.1* 8.3*  PHOS 3.5  --  2.3*  --  5.0*  5.2*  --   --     Liver Function Tests:  Recent Labs Lab 06/14/16 1228 06/16/16 1515  AST 28  --   ALT 20  --   ALKPHOS 147*  --    BILITOT 0.8  --   PROT 7.3  --   ALBUMIN 3.2* 2.9*   No results for input(s): LIPASE, AMYLASE in the last 168 hours. No results for input(s): AMMONIA in the last 168 hours.  CBC:  Recent Labs Lab 06/14/16 1228  06/16/16 0519 06/16/16 1515 06/17/16 0524 06/18/16 0302 06/19/16 1115  WBC 8.4  < > 7.4 7.3 7.5 8.9 6.1  NEUTROABS 6.5  --   --   --   --   --   --   HGB 5.8*  < > 7.1* 6.8* 7.4* 8.4* 9.1*  HCT 18.6*  < > 21.6* 21.1* 22.8* 25.8* 28.1*  MCV 84.8  < > 82.9 84.1 83.0 83.1 83.6  PLT 343  < > 257 244 230 241 259  < > = values in this interval not displayed.  Cardiac Enzymes: No results for input(s): CKTOTAL, CKMB, CKMBINDEX, TROPONINI in the last 168 hours.  BNP: Invalid input(s): POCBNP  CBG:  Recent Labs Lab 06/18/16 1734 06/18/16 2049 06/18/16 2128 06/19/16 0717 06/19/16 1143  GLUCAP 72 74 92 83 97    Microbiology: Results for  orders placed or performed during the hospital encounter of 05/30/16  MRSA PCR Screening     Status: Abnormal   Collection Time: 05/31/16  6:45 AM  Result Value Ref Range Status   MRSA by PCR POSITIVE (A) NEGATIVE Final    Comment:        The GeneXpert MRSA Assay (FDA approved for NASAL specimens only), is one component of a comprehensive MRSA colonization surveillance program. It is not intended to diagnose MRSA infection nor to guide or monitor treatment for MRSA infections. RESULT CALLED TO, READ BACK BY AND VERIFIED WITH:  BRAD CHISMON AT 6761 05/31/16 SDR   Body fluid culture     Status: None   Collection Time: 05/31/16  3:20 PM  Result Value Ref Range Status   Specimen Description PERITONEAL  Final   Special Requests NONE  Final   Gram Stain   Final    FEW WBC PRESENT, PREDOMINANTLY MONONUCLEAR NO ORGANISMS SEEN    Culture   Final    NO GROWTH 3 DAYS Performed at [facility], [address] [place], [place] [address]    Report Status 06/04/2016 FINAL  Final    Coagulation Studies: No results for  input(s): LABPROT, INR in the last 72 hours.  Urinalysis: No results for input(s): COLORURINE, LABSPEC, PHURINE, GLUCOSEU, HGBUR, BILIRUBINUR, KETONESUR, PROTEINUR, UROBILINOGEN, NITRITE, LEUKOCYTESUR in the last 72 hours.  Invalid input(s): APPERANCEUR    Imaging: No results found.   Medications:    . sodium chloride   Intravenous Once  . amLODipine  5 mg Oral Daily  . epoetin (EPOGEN/PROCRIT) injection  10,000 Units Intravenous Q T,Th,Sa-HD  . furosemide  80 mg Oral Daily  . gabapentin  300 mg Oral QHS  . HYDROcodone-acetaminophen  1 tablet Oral Once  . insulin aspart  0-9 Units Subcutaneous TID WC  . labetalol  100 mg Oral Daily  . mometasone-formoterol  2 puff Inhalation BID  . pantoprazole  40 mg Oral QAC breakfast  . sevelamer carbonate  2,400 mg Oral TID WC  . sodium chloride flush  3 mL Intravenous Q12H  . sodium chloride flush  3 mL Intravenous Q12H   sodium chloride, acetaminophen **OR** acetaminophen, albuterol, HYDROcodone-acetaminophen, morphine injection, ondansetron **OR** ondansetron (ZOFRAN) IV, sodium chloride flush  Assessment/ Plan:  56 y.o. male with ESRD on hemodialysis, hypertension, peripheral vascular disease, coronary artery disease, cirrhosis, substance abuse, recurrent GI bleed  TTS [address] Right arm AVF   1. End stage renal disease with hyperkalemia:  Patient completed hemodialysis yesterday. Potassium was 4.6. No acute indication for dialysis today. We will plan for dialysis again Tuesday if he still here.  2. Secondary Hyperparathyroidism:  Phosphorus was not checked yesterday. Continue to monitor serum phosphorus as an outpatient. We will maintain the patient on Renvela 2400 mg by mouth 3 times a day.  3. Hypertension:  Blood pressure currently 140/79. Continue labetalol and amlodipine.  4. Anemia of chronic kidney disease with GI bleed and anemia blood loss.  Hemoglobin currently 9.1 and improved.  Continue Epogen 10,000 units IV with  dialysis.   LOS: 5 Martinez Boxx 4/8/201812:54 PM

## 2016-06-19 NOTE — Discharge Instructions (Signed)
[place] [facility] [place]:  Renal diet  DISCHARGE CONDITION:   stable   ACTIVITY:  As tolerated  OXYGEN:  Home Oxygen: no   Oxygen Delivery: no  DISCHARGE LOCATION:  home   ADDITIONAL DISCHARGE INSTRUCTION:    If you experience worsening of your admission symptoms, develop shortness of breath, life threatening emergency, suicidal or homicidal thoughts you must seek medical attention immediately by calling 911 or calling your MD immediately  if symptoms less severe.  You Must read complete instructions/literature along with all the possible adverse reactions/side effects for all the Medicines you take and that have been prescribed to you. Take any new Medicines after you have completely understood and accpet all the possible adverse reactions/side effects.   Please note  You were cared for by a hospitalist during your hospital stay. If you have any questions about your discharge medications or the care you received while you were in the hospital after you are discharged, you can call the unit and asked to speak with the hospitalist on call if the hospitalist that took care of you is not available. Once you are discharged, your primary care physician will handle any further medical issues. Please note that NO REFILLS for any discharge medications will be authorized once you are discharged, as it is imperative that you return to your primary care physician (or establish a relationship with a primary care physician if you do not have one) for your aftercare needs so that they can reassess your need for medications and monitor your lab values.

## 2016-06-19 NOTE — Progress Notes (Signed)
[facility] [facility] [place] NAME: Stanley [name]    MR#:  [id number]  DATE OF BIRTH:  03/14/1961  SUBJECTIVE:  CHIEF COMPLAINT:   Chief Complaint  Patient presents with  . Rectal Bleeding   Patient's started the procedure with capsule endoscopy earlier today. States that he is very hungry Denies any further bleeding  REVIEW OF SYSTEMS:  Review of Systems  Constitutional: Negative for chills, fever and weight loss.  HENT: Negative for nosebleeds and sore throat.   Eyes: Negative for blurred vision.  Respiratory: Negative for cough, shortness of breath and wheezing.   Cardiovascular: Negative for chest pain, orthopnea, leg swelling and PND.  Gastrointestinal: Negative for abdominal pain, blood in stool, constipation, diarrhea, heartburn, nausea and vomiting.  Genitourinary: Negative for dysuria and urgency.  Musculoskeletal: Negative for back pain.  Skin: Negative for rash.  Neurological: Negative for dizziness, speech change, focal weakness and headaches.  Endo/Heme/Allergies: Does not bruise/bleed easily.  Psychiatric/Behavioral: Negative for depression.   DRUG ALLERGIES:  No Known Allergies VITALS:  Blood pressure 140/79, pulse 73, temperature 98.2 F (36.8 C), temperature source Oral, resp. rate 16, height 6\' 3"  (1.905 m), weight 195 lb 11.2 oz (88.8 kg), SpO2 99 %. PHYSICAL EXAMINATION:  Physical Exam  Constitutional: He is oriented to person, place, and time and well-developed, well-nourished, and in no distress.  HENT:  Head: Normocephalic and atraumatic.  Eyes: Conjunctivae and EOM are normal. Pupils are equal, round, and reactive to light.  Neck: Normal range of motion. Neck supple. No tracheal deviation present. No thyromegaly present.  Cardiovascular: Normal rate, regular rhythm and normal heart sounds.   Pulmonary/Chest: Effort normal and breath sounds normal. No respiratory distress. He has no wheezes. He exhibits no  tenderness.  Abdominal: Soft. Bowel sounds are normal. He exhibits no distension. There is no tenderness.  Musculoskeletal: Normal range of motion.  Neurological: He is alert and oriented to person, place, and time. No cranial nerve deficit.  Skin: Skin is warm and dry. No rash noted.  Psychiatric: Mood and affect normal.   LABORATORY PANEL:  Male CBC  Recent Labs Lab 06/19/16 1115  WBC 6.1  HGB 9.1*  HCT 28.1*  PLT 259   ------------------------------------------------------------------------------------------------------------------ Chemistries   Recent Labs Lab 06/14/16 1228  06/18/16 0302  NA 138  < > 138  K 5.6*  < > 4.6  CL 103  < > 99*  CO2 20*  < > 28  GLUCOSE 121*  < > 89  BUN 100*  < > 45*  CREATININE 11.27*  < > 6.95*  CALCIUM 8.5*  < > 8.3*  AST 28  --   --   ALT 20  --   --   ALKPHOS 147*  --   --   BILITOT 0.8  --   --   < > = values in this interval not displayed. RADIOLOGY:  No results found. ASSESSMENT AND PLAN:   * Rectal bleeding - Likely acute on chronic blood loss. Patient had incomplete colonoscopy due to poor prep.  Started on a capsule endoscopy today  * End stage renal disease on hemodialysis: per Nephro.  * Hyperkalemia: resolved with HD  * Hypertension. Continue home medications.  * Alcoholic cirrhosis. Normal platelets and INR.  * DVT prophylaxis with SCDs.     All the records are reviewed and case discussed with Care Management/Social Worker. Management plans discussed with the patient, family and they are in agreement.  CODE  STATUS: Full Code  TOTAL TIME TAKING CARE OF THIS PATIENT: 25 minutes.   More than 50% of the time was spent in counseling/coordination of care: YES  POSSIBLE D/C IN 1-2 DAYS, DEPENDING ON CLINICAL CONDITION. And GI eval   Dustin Flock M.D on 06/19/2016 at 1:01 PM  Between 7am to 6pm - Pager - [phone]  After 6pm go to [email or web address] - Proofreader  [facility]  Office  [phone]  CC: Primary care physician; Elyse Jarvis, MD  Note: This dictation was prepared with Dragon dictation along with smaller phrase technology. Any transcriptional errors that result from this process are unintentional.

## 2016-06-19 NOTE — Progress Notes (Signed)
Patient discharged this shift by staff via w/c; Patient signed AMA  Initially MD notified. Discharge instructions were ready and prepared by MD,  patient signed discharge order given per MD; patient voices an understanding. No distress observed

## 2016-06-20 ENCOUNTER — Encounter: Payer: Self-pay | Admitting: Gastroenterology

## 2016-06-20 NOTE — Discharge Summary (Signed)
[facility], [place] y.o., DOB 1961/02/20, MRN [id number]. Admission date: 06/14/2016 Discharge Date 06/20/2016 Primary MD Elyse Jarvis, MD Admitting Physician Hillary Bow, MD  Admission Diagnosis  Hyperkalemia [E87.5] Acute GI bleeding [K92.2] ESRD (end stage renal disease) on dialysis ([place]) [N18.6, Z99.2]  Discharge Diagnosis   Active Problems:  Rectal bleeding  Acute on chronic blood loss ESRD Hyperkalemia Alcoholic cirrhosis          Hospital Course   Stanley [name]  is a 56 y.o. male with a known history of Incisional disease on hemodialysis, alcoholic cirrhosis, GI bleed, anemia of chronic disease, hypertension, diabetes with recent endoscopies and and small bowel capsular endoscopies presents to the emergency room complaining of rectal bleeding.  Pt was admitted to hospital for further evaluation. Pt was seen by GI and had  Colonoscopy which was incomplete. Gi recommended capsule endoscopy.  He had the capsule endoscopy. His hemoglobin was stable.  Pt was very anxious to leave after passed the capsule. He is stable for d/c to home           Consults  GI  Significant Tests:  See full reports for all details     Dg Chest 2 View  Result Date: 05/30/2016 CLINICAL DATA:  Subacute onset of shortness of breath. Initial encounter. EXAM: CHEST  2 VIEW COMPARISON:  Chest radiograph performed 04/21/2016 FINDINGS: The lungs are well-aerated. Vascular congestion is noted. Increased interstitial markings raise concern for mild interstitial edema. There is no evidence of pleural effusion or pneumothorax. The heart is mildly enlarged. The patient is status post median sternotomy. No acute osseous abnormalities are seen. IMPRESSION: Vascular congestion and mild cardiomegaly. Increased interstitial markings raise concern for mild interstitial edema. Electronically Signed   By: Garald Balding M.D.   On: 05/30/2016 23:04   Nm Myocar  Multi W/spect W/wall Motion / Ef  Result Date: 06/10/2016  T wave inversion was noted during stress in the II, III and aVF leads.  There was no ST segment deviation noted during stress.  Defect 1: There is a medium defect of severe severity present in the basal inferior and mid inferior location.  Findings consistent with prior inferior myocardial infarction without significant ischemia.  This is an intermediate risk study.  Nuclear stress EF: 40%.    US Paracentesis  Result Date: 05/31/2016 INDICATION: Cirrhosis and ascites. EXAM: ULTRASOUND GUIDED PARACENTESIS MEDICATIONS: None. COMPLICATIONS: None immediate. PROCEDURE: Informed written consent was obtained from the patient after a discussion of the risks, benefits and alternatives to treatment. A timeout was performed prior to the initiation of the procedure. Initial ultrasound was performed to localize ascites. The right lower abdomen was prepped and draped in the usual sterile fashion. 1% lidocaine was used for local anesthesia. Following this, a 6 Fr Safe-T-Centesis catheter was introduced. An ultrasound image was saved for documentation purposes. The paracentesis was performed. The catheter was removed and a dressing was applied. The patient tolerated the procedure well without immediate post procedural complication. FINDINGS: A total of approximately 3.3 L of yellowish fluid was removed. Samples were sent to the laboratory as requested by the clinical team. IMPRESSION: Successful ultrasound-guided paracentesis yielding 3.3 liters of peritoneal fluid. Electronically Signed   By: Aletta Edouard M.D.   On: 05/31/2016 15:50       Today   Subjective:   Stanley [name]   Pt feels well no further lbeeding wants to go home  Objective:   Blood pressure 140/79,  pulse 73, temperature 98.2 F (36.8 C), temperature source Oral, resp. rate 16, height 6\' 3"  (1.905 m), weight 195 lb 11.2 oz (88.8 kg), SpO2 99 %.  . No intake or output data in the  24 hours ending 06/20/16 1522  Exam VITAL SIGNS: Blood pressure 140/79, pulse 73, temperature 98.2 F (36.8 C), temperature source Oral, resp. rate 16, height 6\' 3"  (1.905 m), weight 195 lb 11.2 oz (88.8 kg), SpO2 99 %.  GENERAL:  56 y.o.-year-old patient lying in the bed with no acute distress.  EYES: Pupils equal, round, reactive to light and accommodation. No scleral icterus. Extraocular muscles intact.  HEENT: Head atraumatic, normocephalic. Oropharynx and nasopharynx clear.  NECK:  Supple, no jugular venous distention. No thyroid enlargement, no tenderness.  LUNGS: Normal breath sounds bilaterally, no wheezing, rales,rhonchi or crepitation. No use of accessory muscles of respiration.  CARDIOVASCULAR: S1, S2 normal. No murmurs, rubs, or gallops.  ABDOMEN: Soft, nontender, nondistended. Bowel sounds present. No organomegaly or mass.  EXTREMITIES: No pedal edema, cyanosis, or clubbing.  NEUROLOGIC: Cranial nerves II through XII are intact. Muscle strength 5/5 in all extremities. Sensation intact. Gait not checked.  PSYCHIATRIC: The patient is alert and oriented x 3.  SKIN: No obvious rash, lesion, or ulcer.   Data Review     CBC w Diff:  Lab Results  Component Value Date   WBC 6.1 06/19/2016   HGB 9.1 (L) 06/19/2016   HGB 12.7 (L) 06/10/2014   HCT 28.1 (L) 06/19/2016   HCT 38.8 (L) 06/10/2014   PLT 259 06/19/2016   PLT 201 06/10/2014   LYMPHOPCT 9 06/14/2016   LYMPHOPCT 13.6 06/10/2014   BANDSPCT 1 04/05/2015   MONOPCT 9 06/14/2016   MONOPCT 10.4 06/10/2014   EOSPCT 2 06/14/2016   EOSPCT 0.7 06/10/2014   BASOPCT 2 06/14/2016   BASOPCT 0.6 06/10/2014   CMP:  Lab Results  Component Value Date   NA 138 06/18/2016   NA 143 06/10/2014   K 4.6 06/18/2016   K 5.1 06/10/2014   CL 99 (L) 06/18/2016   CL 102 06/10/2014   CO2 28 06/18/2016   CO2 30 06/10/2014   BUN 45 (H) 06/18/2016   BUN 20 06/10/2014   CREATININE 6.95 (H) 06/18/2016   CREATININE 5.42 (H) 06/10/2014    PROT 7.3 06/14/2016   PROT 7.3 06/10/2014   ALBUMIN 2.9 (L) 06/16/2016   ALBUMIN 3.4 (L) 06/10/2014   BILITOT 0.8 06/14/2016   BILITOT 1.9 (H) 06/10/2014   ALKPHOS 147 (H) 06/14/2016   ALKPHOS 156 (H) 06/10/2014   AST 28 06/14/2016   AST 53 (H) 06/10/2014   ALT 20 06/14/2016   ALT 32 06/10/2014  .  Micro Results No results found for this or any previous visit (from the past 240 hour(s)).   Code Status History    Date Active Date Inactive Code Status Order ID Comments User Context   06/14/2016  2:53 PM 06/19/2016  9:04 PM Full Code [id number]  Hillary Bow, MD ED   05/31/2016  6:42 AM 06/01/2016  9:30 PM Full Code [id number]  Saundra Shelling, MD Inpatient   05/05/2016 11:09 PM 05/10/2016  9:57 PM Full Code [id number]  Vianne Bulls, MD Inpatient   04/16/2016  3:56 AM 04/17/2016  6:27 PM Full Code [id number]  Saundra Shelling, MD Inpatient   01/19/2016  3:13 PM 01/22/2016  2:14 PM Full Code [id number]  Loletha Grayer, MD ED   09/24/2015 10:33 AM 09/25/2015  1:31 PM Full Code [id number]  Loletha Grayer, MD ED   05/16/2015  2:17 PM 05/17/2015  8:57 PM Full Code [id number]  Idelle Crouch, MD Inpatient   04/30/2015  8:08 PM 05/08/2015  7:40 PM Full Code [id number]  Vaughan Basta, MD Inpatient   04/06/2015  5:07 AM 04/07/2015  5:36 PM Full Code [id number]  Lance Coon, MD Inpatient   03/23/2015 11:07 AM 03/27/2015 12:46 PM Full Code [id number]  Bettey Costa, MD Inpatient   06/19/2013 11:32 AM 06/21/2013  5:49 PM Full Code [id number]  Cristal Ford, DO Inpatient   06/16/2013  5:26 PM 06/19/2013 11:32 AM Full Code [id number]  Margarita Mail, PA-C ED   06/05/2013  3:33 PM 06/06/2013  6:56 PM Full Code [id number]  Theodoro Kos, DO Inpatient   06/02/2013  9:01 AM 06/05/2013  3:33 PM Full Code [id number]  Velvet Bathe, MD Inpatient   06/02/2013  8:40 AM 06/02/2013  9:01 AM Full Code [id number]  Velvet Bathe, MD Inpatient          Follow-up Information    Elyse Jarvis, MD Follow up in 1 week(s).   Specialty:  Family  Medicine Contact information: [address] [place] [address] [phone]        [name], MD Follow up in 1 week(s).   Specialty:  Gastroenterology Contact information: [place] [address] [phone]           Discharge Medications   Allergies as of 06/19/2016   No Known Allergies     Medication List    TAKE these medications   amLODipine 5 MG tablet Commonly known as:  NORVASC Take 5 mg by mouth daily.   atorvastatin 40 MG tablet Commonly known as:  LIPITOR Take 40 mg by mouth daily.   b complex-vitamin c-folic acid 0.8 MG Tabs tablet Take 1 tablet by mouth daily.   budesonide-formoterol 160-4.5 MCG/ACT inhaler Commonly known as:  SYMBICORT Inhale 2 puffs into the lungs daily.   DIALYVITE SUPREME D 3 MG Tabs Take 1 tablet by mouth daily.   furosemide 80 MG tablet Commonly known as:  LASIX Take 80 mg by mouth daily.   gabapentin 300 MG capsule Commonly known as:  NEURONTIN Take 1 capsule (300 mg total) by mouth daily. What changed:  when to take this   hydrOXYzine 25 MG capsule Commonly known as:  VISTARIL Take 1 capsule (25 mg total) by mouth 3 (three) times daily as needed for itching.   labetalol 100 MG tablet Commonly known as:  NORMODYNE Take 1 tablet (100 mg total) by mouth daily.   mupirocin ointment 2 % Commonly known as:  BACTROBAN Place 1 application into the nose 2 (two) times daily.   naproxen 500 MG tablet Commonly known as:  NAPROSYN Take 500 mg by mouth 2 (two) times daily as needed. for pain   nitroGLYCERIN 0.4 MG SL tablet Commonly known as:  NITROSTAT Place 1 tablet (0.4 mg total) under the tongue every 5 (five) minutes as needed. What changed:  reasons to take this   oxyCODONE-acetaminophen 5-325 MG tablet Commonly known as:  ROXICET Take 1-2 tablets by mouth every 4 (four) hours as needed for severe pain.   pantoprazole 40 MG tablet Commonly known as:  PROTONIX Take 1 tablet (40  mg total) by mouth daily.   sevelamer carbonate 800 MG tablet Commonly known as:  RENVELA Take 3 tablets (2,400 mg total) by mouth 3 (three) times daily with meals.  Total Time in preparing paper work, data evaluation and todays exam - 35 minutes  Dustin Flock M.D on 06/20/2016 at 3:22 PM  [facility]   Office  [phone]

## 2016-06-21 DIAGNOSIS — N186 End stage renal disease: Secondary | ICD-10-CM | POA: Diagnosis not present

## 2016-06-21 DIAGNOSIS — Z23 Encounter for immunization: Secondary | ICD-10-CM | POA: Diagnosis not present

## 2016-06-21 LAB — SURGICAL PATHOLOGY

## 2016-06-22 LAB — PATHOLOGY

## 2016-06-23 DIAGNOSIS — Z23 Encounter for immunization: Secondary | ICD-10-CM | POA: Diagnosis not present

## 2016-06-23 DIAGNOSIS — N186 End stage renal disease: Secondary | ICD-10-CM | POA: Diagnosis not present

## 2016-06-24 ENCOUNTER — Inpatient Hospital Stay: Payer: Medicare Other

## 2016-06-24 ENCOUNTER — Encounter: Payer: Self-pay | Admitting: Emergency Medicine

## 2016-06-24 ENCOUNTER — Inpatient Hospital Stay
Admission: EM | Admit: 2016-06-24 | Discharge: 2016-06-26 | DRG: 377 | Disposition: A | Discharge status: Home / Self Care | Payer: Medicare Other | Attending: Internal Medicine | Admitting: Internal Medicine

## 2016-06-24 DIAGNOSIS — Z7952 Long term (current) use of systemic steroids: Secondary | ICD-10-CM

## 2016-06-24 DIAGNOSIS — D509 Iron deficiency anemia, unspecified: Secondary | ICD-10-CM | POA: Diagnosis not present

## 2016-06-24 DIAGNOSIS — Z79899 Other long term (current) drug therapy: Secondary | ICD-10-CM

## 2016-06-24 DIAGNOSIS — D631 Anemia in chronic kidney disease: Secondary | ICD-10-CM | POA: Diagnosis not present

## 2016-06-24 DIAGNOSIS — K219 Gastro-esophageal reflux disease without esophagitis: Secondary | ICD-10-CM | POA: Diagnosis present

## 2016-06-24 DIAGNOSIS — K922 Gastrointestinal hemorrhage, unspecified: Secondary | ICD-10-CM

## 2016-06-24 DIAGNOSIS — F1910 Other psychoactive substance abuse, uncomplicated: Secondary | ICD-10-CM | POA: Diagnosis present

## 2016-06-24 DIAGNOSIS — N2581 Secondary hyperparathyroidism of renal origin: Secondary | ICD-10-CM | POA: Diagnosis present

## 2016-06-24 DIAGNOSIS — I2510 Atherosclerotic heart disease of native coronary artery without angina pectoris: Secondary | ICD-10-CM | POA: Diagnosis present

## 2016-06-24 DIAGNOSIS — I120 Hypertensive chronic kidney disease with stage 5 chronic kidney disease or end stage renal disease: Secondary | ICD-10-CM | POA: Diagnosis present

## 2016-06-24 DIAGNOSIS — Z951 Presence of aortocoronary bypass graft: Secondary | ICD-10-CM

## 2016-06-24 DIAGNOSIS — K7031 Alcoholic cirrhosis of liver with ascites: Secondary | ICD-10-CM | POA: Diagnosis present

## 2016-06-24 DIAGNOSIS — N186 End stage renal disease: Secondary | ICD-10-CM | POA: Diagnosis present

## 2016-06-24 DIAGNOSIS — Z992 Dependence on renal dialysis: Secondary | ICD-10-CM

## 2016-06-24 DIAGNOSIS — F17210 Nicotine dependence, cigarettes, uncomplicated: Secondary | ICD-10-CM | POA: Diagnosis present

## 2016-06-24 DIAGNOSIS — D500 Iron deficiency anemia secondary to blood loss (chronic): Secondary | ICD-10-CM | POA: Diagnosis present

## 2016-06-24 DIAGNOSIS — K625 Hemorrhage of anus and rectum: Secondary | ICD-10-CM | POA: Diagnosis not present

## 2016-06-24 LAB — COMPREHENSIVE METABOLIC PANEL
ALT: 20 U/L (ref 17–63)
AST: 37 U/L (ref 15–41)
Albumin: 3.1 g/dL — ABNORMAL LOW (ref 3.5–5.0)
Alkaline Phosphatase: 134 U/L — ABNORMAL HIGH (ref 38–126)
Anion gap: 12 (ref 5–15)
BILIRUBIN TOTAL: 0.6 mg/dL (ref 0.3–1.2)
BUN: 38 mg/dL — AB (ref 6–20)
CO2: 30 mmol/L (ref 22–32)
Calcium: 8.7 mg/dL — ABNORMAL LOW (ref 8.9–10.3)
Chloride: 99 mmol/L — ABNORMAL LOW (ref 101–111)
Creatinine, Ser: 6.61 mg/dL — ABNORMAL HIGH (ref 0.61–1.24)
GFR, EST AFRICAN AMERICAN: 10 mL/min — AB (ref >60)
GFR, EST NON AFRICAN AMERICAN: 8 mL/min — AB (ref >60)
Glucose, Bld: 127 mg/dL — ABNORMAL HIGH (ref 65–99)
POTASSIUM: 4.5 mmol/L (ref 3.5–5.1)
Sodium: 141 mmol/L (ref 135–145)
TOTAL PROTEIN: 7.1 g/dL (ref 6.5–8.1)

## 2016-06-24 LAB — CBC
HEMATOCRIT: 23.4 % — AB (ref 40.0–52.0)
Hemoglobin: 7.5 g/dL — ABNORMAL LOW (ref 13.0–18.0)
MCH: 27.5 pg (ref 26.0–34.0)
MCHC: 32 g/dL (ref 32.0–36.0)
MCV: 86.1 fL (ref 80.0–100.0)
PLATELETS: 218 10*3/uL (ref 150–440)
RBC: 2.72 MIL/uL — ABNORMAL LOW (ref 4.40–5.90)
RDW: 17.6 % — AB (ref 11.5–14.5)
WBC: 8.1 10*3/uL (ref 3.8–10.6)

## 2016-06-24 LAB — PROTIME-INR
INR: 1.16
PROTHROMBIN TIME: 14.9 s (ref 11.4–15.2)

## 2016-06-24 LAB — PREPARE RBC (CROSSMATCH)

## 2016-06-24 LAB — HEMOGLOBIN AND HEMATOCRIT, BLOOD
HEMATOCRIT: 32.4 % — AB (ref 40.0–52.0)
HEMOGLOBIN: 10.3 g/dL — AB (ref 13.0–18.0)

## 2016-06-24 MED ORDER — SODIUM CHLORIDE 0.9% FLUSH
3.0000 mL | Freq: Two times a day (BID) | INTRAVENOUS | Status: DC
  Administered 2016-06-24 – 2016-06-26 (×4): 3 mL via INTRAVENOUS

## 2016-06-24 MED ORDER — SEVELAMER CARBONATE 800 MG PO TABS
2400.0000 mg | ORAL_TABLET | Freq: Three times a day (TID) | ORAL | Status: DC
  Administered 2016-06-25 – 2016-06-26 (×3): 2400 mg via ORAL
  Filled 2016-06-24 (×3): qty 3

## 2016-06-24 MED ORDER — MOMETASONE FURO-FORMOTEROL FUM 200-5 MCG/ACT IN AERO
2.0000 | INHALATION_SPRAY | Freq: Two times a day (BID) | RESPIRATORY_TRACT | Status: DC
  Administered 2016-06-24 – 2016-06-26 (×5): 2 via RESPIRATORY_TRACT
  Filled 2016-06-24: qty 8.8

## 2016-06-24 MED ORDER — ONDANSETRON HCL 4 MG/2ML IJ SOLN: 4.0000 mg | Freq: Four times a day (QID) | INTRAMUSCULAR | Status: DC | PRN

## 2016-06-24 MED ORDER — ONDANSETRON HCL 4 MG PO TABS: 4.0000 mg | ORAL_TABLET | Freq: Four times a day (QID) | ORAL | Status: DC | PRN

## 2016-06-24 MED ORDER — PANTOPRAZOLE SODIUM 40 MG IV SOLR
40.0000 mg | Freq: Once | INTRAVENOUS | Status: AC
  Administered 2016-06-24: 40 mg via INTRAVENOUS
  Filled 2016-06-24: qty 40

## 2016-06-24 MED ORDER — SODIUM CHLORIDE 0.9 % IV SOLN
8.0000 mg/h | INTRAVENOUS | Status: DC
  Administered 2016-06-24: 8 mg/h via INTRAVENOUS
  Filled 2016-06-24: qty 80

## 2016-06-24 MED ORDER — PANTOPRAZOLE SODIUM 40 MG IV SOLR
40.0000 mg | Freq: Two times a day (BID) | INTRAVENOUS | Status: DC
  Administered 2016-06-24 – 2016-06-26 (×5): 40 mg via INTRAVENOUS
  Filled 2016-06-24 (×5): qty 40

## 2016-06-24 MED ORDER — TECHNETIUM TC 99M-LABELED RED BLOOD CELLS IV KIT
21.0110 | PACK | Freq: Once | INTRAVENOUS | Status: AC | PRN
  Administered 2016-06-24: 21.011 via INTRAVENOUS

## 2016-06-24 MED ORDER — MORPHINE SULFATE (PF) 2 MG/ML IV SOLN
2.0000 mg | INTRAVENOUS | Status: DC | PRN
  Administered 2016-06-24 – 2016-06-26 (×9): 2 mg via INTRAVENOUS
  Filled 2016-06-24 (×9): qty 1

## 2016-06-24 MED ORDER — ALPRAZOLAM 0.25 MG PO TABS
0.2500 mg | ORAL_TABLET | Freq: Two times a day (BID) | ORAL | Status: DC | PRN
  Administered 2016-06-24 – 2016-06-26 (×4): 0.25 mg via ORAL
  Filled 2016-06-24 (×4): qty 1

## 2016-06-24 MED ORDER — SODIUM CHLORIDE 0.9 % IV SOLN
10.0000 mL/h | Freq: Once | INTRAVENOUS | Status: AC
  Administered 2016-06-24: 10 mL/h via INTRAVENOUS

## 2016-06-24 MED ORDER — GABAPENTIN 600 MG PO TABS
300.0000 mg | ORAL_TABLET | Freq: Every day | ORAL | Status: DC
  Administered 2016-06-25: 300 mg via ORAL
  Filled 2016-06-24: qty 1
  Filled 2016-06-24: qty 2

## 2016-06-24 MED ORDER — SODIUM CHLORIDE 0.9 % IV SOLN: INTRAVENOUS | Status: DC

## 2016-06-24 NOTE — ED Notes (Signed)
Pt being transported to room 208 by Lattie Haw, EDT and this tech at this time.

## 2016-06-24 NOTE — Progress Notes (Signed)
Prime doc paged pt refuses gabapetin.

## 2016-06-24 NOTE — ED Notes (Signed)
ED Provider at bedside. 

## 2016-06-24 NOTE — ED Provider Notes (Signed)
[facility] Emergency Department Provider Note   ____________________________________________   First MD Initiated Contact with Patient 06/24/16 [phone]     (approximate)  I have reviewed the triage vital signs and the nursing notes.   HISTORY  Chief Complaint Rectal Bleeding    HPI Stanley [name] is a 56 y.o. male to the ED from home via EMS with a chief complaint of rectal bleeding. Patient has a history significant for cirrhosis, chronic GI bleed secondary to AVMs, CAD, ESRD on HD T/Th/Sat who returns to the ED with rectal bleeding. Patient wasrecently hospitalized 4/3-4/9 for same; initial H/H 7.4/22.8 transfer used, and discharged 9.1/28.1. Patient had an incomplete colonoscopy and capsule procedure. States he did fine until midnight tonight when he had a bloody bowel movement. Denies associated fever, chills, chest pain, shortness of breath, abdominal pain, nausea, vomiting. Denies recent travel or trauma. Denies alcohol use. Takes a baby aspirin daily. Reports he went to dialysis yesterday.   Past Medical History:  Diagnosis Date  . Alcohol abuse   . Cirrhosis ([place])   . Coronary artery disease 2009  . Diabetic peripheral neuropathy ([place])   . Drug abuse   . End stage renal disease on dialysis [facility]) NEPHROLOGIST-   [facility]  IN [place]   HEMODIALYSIS --   TUES/  THURS/  SAT  . GERD (gastroesophageal reflux disease)   . Hyperlipidemia   . Hypertension   . PAD (peripheral artery disease) ([place])   . Renal insufficiency    Per pt, 32 oz fluid restriction per day  . S/P triple vessel bypass 06/09/2016   2009ish  . Suicidal ideation    & HOMICIDAL IDEATION --  06-16-2013   ADMITTED TO BEHAVIOR HEALTH    Patient Active Problem List   Diagnosis Date Noted  . Rectal bleeding 06/14/2016  . Anemia of chronic disease 06/01/2016  . MRSA carrier 06/01/2016  . Chronic renal failure 05/23/2016  . Ischemic heart disease 05/23/2016  . Angiodysplasia  of small intestine   . Melena   . Small bowel bleed not requiring more than 4 units of blood in 24 hours, ICU, or surgery   . Anemia due to chronic blood loss   . Abdominal pain 05/05/2016  . Acute posthemorrhagic anemia 04/17/2016  . Gastrointestinal bleed 04/17/2016  . History of esophagogastroduodenoscopy (EGD) 04/17/2016  . Elevated troponin 04/17/2016  . Alcohol abuse 04/17/2016  . Upper GI bleed 01/19/2016  . Blood in stool   . Angiodysplasia of stomach and duodenum with hemorrhage   . Gastritis   . Esophagitis, unspecified   . GI bleed 05/16/2015  . Acute GI bleeding   . Symptomatic anemia 04/30/2015  . HTN (hypertension) 04/06/2015  . GERD (gastroesophageal reflux disease) 04/06/2015  . HLD (hyperlipidemia) 04/06/2015  . Dyspnea 04/06/2015  . Cirrhosis of liver with ascites ([place]) 04/06/2015  . Ascites 04/06/2015  . GIB (gastrointestinal bleeding) 03/23/2015  . Homicidal ideation 06/19/2013  . Suicidal intent 06/19/2013  . Homicidal ideations 06/19/2013  . Hyperkalemia 06/16/2013  . Mandible fracture ([place]) 06/05/2013  . Fracture, mandible ([place]) 06/02/2013  . Coronary atherosclerosis of native coronary artery 06/02/2013  . ESRD on dialysis ([place]) 06/02/2013  . Mandible open fracture ([place]) 06/02/2013    Past Surgical History:  Procedure Laterality Date  . AGILE CAPSULE N/A 06/19/2016   Procedure: AGILE CAPSULE;  Surgeon: [name], MD;  Location[facility] ENDOSCOPY;  Service: Endoscopy;  Laterality: N/A;  . COLONOSCOPY WITH PROPOFOL N/A 06/18/2016   Procedure:  COLONOSCOPY WITH PROPOFOL;  Surgeon: [name], MD;  Location: [facility] ENDOSCOPY;  Service: Endoscopy;  Laterality: N/A;  . CORONARY ANGIOPLASTY  ?   PT UNABLE TO TELL IF  BEFORE OR AFTER  CABG  . CORONARY ARTERY BYPASS GRAFT  2008  ([place] , [place])   3 VESSEL  . DIALYSIS FISTULA CREATION  LAST SURGERY  APPOX  2008  . ENTEROSCOPY N/A 05/10/2016   Procedure: ENTEROSCOPY;  Surgeon: Jerene Bears, MD;  Location: [facility];  Service: Gastroenterology;  Laterality: N/A;  . ESOPHAGOGASTRODUODENOSCOPY N/A 05/07/2015   Procedure: ESOPHAGOGASTRODUODENOSCOPY (EGD);  Surgeon: Hulen Luster, MD;  Location: [facility] ENDOSCOPY;  Service: Endoscopy;  Laterality: N/A;  . ESOPHAGOGASTRODUODENOSCOPY (EGD) WITH PROPOFOL N/A 05/17/2015   Procedure: ESOPHAGOGASTRODUODENOSCOPY (EGD) WITH PROPOFOL;  Surgeon: [name], MD;  Location: [facility] ENDOSCOPY;  Service: Endoscopy;  Laterality: N/A;  . ESOPHAGOGASTRODUODENOSCOPY (EGD) WITH PROPOFOL N/A 01/20/2016   Procedure: ESOPHAGOGASTRODUODENOSCOPY (EGD) WITH PROPOFOL;  Surgeon: [name], MD;  Location: [facility] ENDOSCOPY;  Service: Endoscopy;  Laterality: N/A;  . ESOPHAGOGASTRODUODENOSCOPY (EGD) WITH PROPOFOL N/A 04/17/2016   Procedure: ESOPHAGOGASTRODUODENOSCOPY (EGD) WITH PROPOFOL;  Surgeon: Lin Landsman, MD;  Location: [facility] ENDOSCOPY;  Service: Gastroenterology;  Laterality: N/A;  . ESOPHAGOGASTRODUODENOSCOPY (EGD) WITH PROPOFOL  05/09/2016   Procedure: ESOPHAGOGASTRODUODENOSCOPY (EGD) WITH PROPOFOL;  Surgeon: Jerene Bears, MD;  Location: [place];  Service: Endoscopy;;  . ESOPHAGOGASTRODUODENOSCOPY (EGD) WITH PROPOFOL N/A 06/16/2016   Procedure: ESOPHAGOGASTRODUODENOSCOPY (EGD) WITH PROPOFOL;  Surgeon: [name], MD;  Location: [facility] ENDOSCOPY;  Service: Endoscopy;  Laterality: N/A;  . GIVENS CAPSULE STUDY N/A 05/07/2016   Procedure: GIVENS CAPSULE STUDY;  Surgeon: Doran Stabler, MD;  Location: [place];  Service: Endoscopy;  Laterality: N/A;  . MANDIBULAR HARDWARE REMOVAL N/A 07/29/2013   Procedure: REMOVAL OF ARCH BARS;  Surgeon: Theodoro Kos, DO;  Location: [place];  Service: Plastics;  Laterality: N/A;  . ORIF MANDIBULAR FRACTURE N/A 06/05/2013   Procedure: REPAIR OF MANDIBULAR FRACTURE x 2 with maxillo-mandibular fixation ;  Surgeon: Theodoro Kos, DO;  Location: [place];  Service: Plastics;  Laterality: N/A;  . PERIPHERAL ARTERIAL STENT GRAFT Left      Prior to Admission medications   Medication Sig Start Date End Date Taking? Authorizing Provider  amLODipine (NORVASC) 5 MG tablet Take 5 mg by mouth daily.   Yes Historical Provider, MD  atorvastatin (LIPITOR) 40 MG tablet Take 40 mg by mouth daily. 04/19/16  Yes Historical Provider, MD  b complex-vitamin c-folic acid (NEPHRO-VITE) 0.8 MG TABS tablet Take 1 tablet by mouth daily.   Yes Historical Provider, MD  budesonide-formoterol (SYMBICORT) 160-4.5 MCG/ACT inhaler Inhale 2 puffs into the lungs daily.   Yes Historical Provider, MD  furosemide (LASIX) 80 MG tablet Take 80 mg by mouth daily. 05/11/16  Yes Historical Provider, MD  gabapentin (NEURONTIN) 300 MG capsule Take 1 capsule (300 mg total) by mouth daily. Patient taking differently: Take 300 mg by mouth at bedtime.  03/27/15  Yes Bettey Costa, MD  hydrOXYzine (VISTARIL) 25 MG capsule Take 1 capsule (25 mg total) by mouth 3 (three) times daily as needed for itching. 02/25/16  Yes Jami L Hagler, PA-C  labetalol (NORMODYNE) 100 MG tablet Take 1 tablet (100 mg total) by mouth daily. 01/22/16  Yes Epifanio Lesches, MD  Multiple Vitamins-Minerals-FA (DIALYVITE SUPREME D) 3 MG TABS Take 1 tablet by mouth daily. 05/27/16  Yes Historical Provider, MD  mupirocin ointment (BACTROBAN) 2 % Place 1 application into the nose 2 (  two) times daily. 06/01/16  Yes Theodoro Grist, MD  naproxen (NAPROSYN) 500 MG tablet Take 500 mg by mouth 2 (two) times daily as needed. for pain 04/28/16  Yes Historical Provider, MD  nitroGLYCERIN (NITROSTAT) 0.4 MG SL tablet Place 1 tablet (0.4 mg total) under the tongue every 5 (five) minutes as needed. Patient taking differently: Place 0.4 mg under the tongue every 5 (five) minutes as needed for chest pain.  04/12/16  Yes Wende Bushy, MD  oxyCODONE-acetaminophen (ROXICET) 5-325 MG tablet Take 1-2 tablets by mouth every 4 (four) hours as needed for severe pain. 04/21/16  Yes Hinda Kehr, MD  pantoprazole (PROTONIX) 40 MG tablet  Take 1 tablet (40 mg total) by mouth daily. 05/10/16  Yes Shanker Kristeen Mans, MD  sevelamer carbonate (RENVELA) 800 MG tablet Take 3 tablets (2,400 mg total) by mouth 3 (three) times daily with meals. 06/01/16  Yes Theodoro Grist, MD    Allergies Patient has no known allergies.  Family History  Problem Relation Age of Onset  . Colon cancer Mother   . Cancer Father   . Cancer Sister     Social History Social History  Substance Use Topics  . Smoking status: Current Every Day Smoker    Packs/day: 0.15    Years: 40.00    Types: Cigarettes  . Smokeless tobacco: Never Used  . Alcohol use No     Comment: pt reports quitting after learning about cirrhosis    Review of Systems  Constitutional: No fever/chills. Eyes: No visual changes. ENT: No sore throat. Cardiovascular: Denies chest pain. Respiratory: Denies shortness of breath. Gastrointestinal: Positive for rectal bleeding. No abdominal pain.  No nausea, no vomiting.  No diarrhea.  No constipation. Genitourinary: Negative for dysuria. Musculoskeletal: Negative for back pain. Skin: Negative for rash. Neurological: Negative for headaches, focal weakness or numbness.  10-point ROS otherwise negative.  ____________________________________________   PHYSICAL EXAM:  VITAL SIGNS: ED Triage Vitals  Enc Vitals Group     BP 06/24/16 0158 139/75     Pulse Rate 06/24/16 0158 95     Resp 06/24/16 0158 16     Temp 06/24/16 0158 98.4 F (36.9 C)     Temp Source 06/24/16 0158 Oral     SpO2 06/24/16 0158 94 %     Weight 06/24/16 0158 185 lb (83.9 kg)     Height 06/24/16 0158 6\' 3"  (1.905 m)     Head Circumference --      Peak Flow --      Pain Score 06/24/16 0157 9     Pain Loc --      Pain Edu? --      Excl. in [place]? --     Constitutional: Alert and oriented. Well appearing and in no acute distress. Eyes: Conjunctivae are normal. PERRL. EOMI. Head: Atraumatic. Nose: No congestion/rhinnorhea. Mouth/Throat: Mucous membranes  are moist.  Oropharynx non-erythematous. Neck: No stridor.   Cardiovascular: Normal rate, regular rhythm. Grossly normal heart sounds.  Good peripheral circulation. Respiratory: Normal respiratory effort.  No retractions. Lungs CTAB. Gastrointestinal: Soft and nontender. Moderate distention secondary to ascites. No abdominal bruits. No CVA tenderness. Musculoskeletal: No lower extremity tenderness nor edema.  No joint effusions. Neurologic:  Normal speech and language. No gross focal neurologic deficits are appreciated. No gait instability. Skin:  Skin is warm, dry and intact. No rash noted. Psychiatric: Mood and affect are normal. Speech and behavior are normal.  ____________________________________________   LABS (all labs ordered are listed, but only abnormal  results are displayed)  Labs Reviewed  COMPREHENSIVE METABOLIC PANEL - Abnormal; Notable for the following:       Result Value   Chloride 99 (*)    Glucose, Bld 127 (*)    BUN 38 (*)    Creatinine, Ser 6.61 (*)    Calcium 8.7 (*)    Albumin 3.1 (*)    Alkaline Phosphatase 134 (*)    GFR calc non Af Amer 8 (*)    GFR calc Af Amer 10 (*)    All other components within normal limits  CBC - Abnormal; Notable for the following:    RBC 2.72 (*)    Hemoglobin 7.5 (*)    HCT 23.4 (*)    RDW 17.6 (*)    All other components within normal limits  POC OCCULT BLOOD, ED  TYPE AND SCREEN  PREPARE RBC (CROSSMATCH)   ____________________________________________  EKG  ED ECG REPORT I, SUNG,JADE J, the attending physician, personally viewed and interpreted this ECG.   Date: 06/24/2016  EKG Time: 0237  Rate: 97  Rhythm: normal EKG, normal sinus rhythm  Axis: Normal  Intervals:none  ST&T Change: Nonspecific  ____________________________________________  RADIOLOGY  None ____________________________________________   PROCEDURES  Procedure(s) performed:   Rectal exam: External exam WNL. No gross blood. Hematochezia  noted on gloved finger.   Procedures  Critical Care performed: Yes, see critical care note(s)  CRITICAL CARE Performed by: Paulette Blanch   Total critical care time: 30 minutes  Critical care time was exclusive of separately billable procedures and treating other patients.  Critical care was necessary to treat or prevent imminent or life-threatening deterioration.  Critical care was time spent personally by me on the following activities: development of treatment plan with patient and/or surrogate as well as nursing, discussions with consultants, evaluation of patient's response to treatment, examination of patient, obtaining history from patient or surrogate, ordering and performing treatments and interventions, ordering and review of laboratory studies, ordering and review of radiographic studies, pulse oximetry and re-evaluation of patient's condition. ____________________________________________   INITIAL IMPRESSION / ASSESSMENT AND PLAN / ED COURSE  Pertinent labs & imaging results that were available during my care of the patient were reviewed by me and considered in my medical decision making (see chart for details).  56 year old male with cirrhosis, ESRD on hemodialysis, recurrent GI bleed who presents with rectal bleeding with hematochezia. H/H has dropped since discharge from the hospital 4 days ago. Will order 2 units PRBCs for transfusion and discuss with hospitalist to evaluate patient in the emergency department for admission.      ____________________________________________   FINAL CLINICAL IMPRESSION(S) / ED DIAGNOSES  Final diagnoses:  Rectal bleeding  Iron deficiency anemia, unspecified iron deficiency anemia type  Stage 5 chronic kidney disease on chronic dialysis ([place])      NEW MEDICATIONS STARTED DURING THIS VISIT:  New Prescriptions   No medications on file     Note:  This document was prepared using Dragon voice recognition software and may  include unintentional dictation errors.    Paulette Blanch, MD 06/24/16 [id number]

## 2016-06-24 NOTE — H&P (Signed)
[facility] NAME: Stanley [name]    MR#:  [id number]  DATE OF BIRTH:  January 29, 1961  DATE OF ADMISSION:  06/24/2016  PRIMARY CARE PHYSICIAN: Elyse Jarvis, MD   REQUESTING/REFERRING PHYSICIAN:   CHIEF COMPLAINT:   Chief Complaint  Patient presents with  . Rectal Bleeding    HISTORY OF PRESENT ILLNESS: Stanley [name]  is a 56 y.o. male with a known history of Cirrhosis of liver, coronary artery disease, end-stage renal disease on dialysis, substance abuse presented to the emergency room with rectal bleed. Patient had rectal bleed around midnight and he had couple of bloody bowel movement yesterday. He gets dialyzed on Tuesday Thursday and Saturday. Patient was at our hospital from March 3 2/9 and he had colonoscopy which was incomplete study. Patient recently had endoscopy in February. Patient also had capsule endoscopy in the past. His hemoglobin today is 7.5 and 2 units of packed red blood cells transfusion was ordered by emergency room physician. Patient has abdominal distention secondary to ascites and also has swelling of the legs secondary to fluid retention. No history of any vomiting of blood or coughing of blood. Hospitalist service was consulted for further care of the patient. Has generalized weakness tiredness and fatigue. No fever or chills and cough.  PAST MEDICAL HISTORY:   Past Medical History:  Diagnosis Date  . Alcohol abuse   . Cirrhosis ([place])   . Coronary artery disease 2009  . Diabetic peripheral neuropathy ([place])   . Drug abuse   . End stage renal disease on dialysis [facility]) NEPHROLOGIST-   DR [facility]  IN [place]   HEMODIALYSIS --   TUES/  THURS/  SAT  . GERD (gastroesophageal reflux disease)   . Hyperlipidemia   . Hypertension   . PAD (peripheral artery disease) ([place])   . Renal insufficiency    Per pt, 32 oz fluid restriction per day  . S/P triple vessel bypass 06/09/2016   2009ish  . Suicidal ideation     & HOMICIDAL IDEATION --  06-16-2013   ADMITTED TO BEHAVIOR HEALTH    PAST SURGICAL HISTORY: Past Surgical History:  Procedure Laterality Date  . AGILE CAPSULE N/A 06/19/2016   Procedure: AGILE CAPSULE;  Surgeon: [name], MD;  Location: [facility] ENDOSCOPY;  Service: Endoscopy;  Laterality: N/A;  . COLONOSCOPY WITH PROPOFOL N/A 06/18/2016   Procedure: COLONOSCOPY WITH PROPOFOL;  Surgeon: [name], MD;  Location: [facility] ENDOSCOPY;  Service: Endoscopy;  Laterality: N/A;  . CORONARY ANGIOPLASTY  ?   PT UNABLE TO TELL IF  BEFORE OR AFTER  CABG  . CORONARY ARTERY BYPASS GRAFT  2008  ([place] , )   3 VESSEL  . DIALYSIS FISTULA CREATION  LAST SURGERY  APPOX  2008  . ENTEROSCOPY N/A 05/10/2016   Procedure: ENTEROSCOPY;  Surgeon: Jerene Bears, MD;  Location: [place];  Service: Gastroenterology;  Laterality: N/A;  . ESOPHAGOGASTRODUODENOSCOPY N/A 05/07/2015   Procedure: ESOPHAGOGASTRODUODENOSCOPY (EGD);  Surgeon: Hulen Luster, MD;  Location: [facility] ENDOSCOPY;  Service: Endoscopy;  Laterality: N/A;  . ESOPHAGOGASTRODUODENOSCOPY (EGD) WITH PROPOFOL N/A 05/17/2015   Procedure: ESOPHAGOGASTRODUODENOSCOPY (EGD) WITH PROPOFOL;  Surgeon: [name], MD;  Location: [facility] ENDOSCOPY;  Service: Endoscopy;  Laterality: N/A;  . ESOPHAGOGASTRODUODENOSCOPY (EGD) WITH PROPOFOL N/A 01/20/2016   Procedure: ESOPHAGOGASTRODUODENOSCOPY (EGD) WITH PROPOFOL;  Surgeon: [name], MD;  Location: [facility] ENDOSCOPY;  Service: Endoscopy;  Laterality: N/A;  . ESOPHAGOGASTRODUODENOSCOPY (EGD) WITH PROPOFOL N/A 04/17/2016   Procedure: ESOPHAGOGASTRODUODENOSCOPY (EGD)  WITH PROPOFOL;  Surgeon: Lin Landsman, MD;  Location: [facility] ENDOSCOPY;  Service: Gastroenterology;  Laterality: N/A;  . ESOPHAGOGASTRODUODENOSCOPY (EGD) WITH PROPOFOL  05/09/2016   Procedure: ESOPHAGOGASTRODUODENOSCOPY (EGD) WITH PROPOFOL;  Surgeon: Jerene Bears, MD;  Location: [place];  Service: Endoscopy;;  . ESOPHAGOGASTRODUODENOSCOPY (EGD) WITH PROPOFOL N/A  06/16/2016   Procedure: ESOPHAGOGASTRODUODENOSCOPY (EGD) WITH PROPOFOL;  Surgeon: [name], MD;  Location: [facility] ENDOSCOPY;  Service: Endoscopy;  Laterality: N/A;  . GIVENS CAPSULE STUDY N/A 05/07/2016   Procedure: GIVENS CAPSULE STUDY;  Surgeon: Doran Stabler, MD;  Location: [place];  Service: Endoscopy;  Laterality: N/A;  . MANDIBULAR HARDWARE REMOVAL N/A 07/29/2013   Procedure: REMOVAL OF ARCH BARS;  Surgeon: Theodoro Kos, DO;  Location: [place];  Service: Plastics;  Laterality: N/A;  . ORIF MANDIBULAR FRACTURE N/A 06/05/2013   Procedure: REPAIR OF MANDIBULAR FRACTURE x 2 with maxillo-mandibular fixation ;  Surgeon: Theodoro Kos, DO;  Location: [place];  Service: Plastics;  Laterality: N/A;  . PERIPHERAL ARTERIAL STENT GRAFT Left     SOCIAL HISTORY:  Social History  Substance Use Topics  . Smoking status: Current Every Day Smoker    Packs/day: 0.15    Years: 40.00    Types: Cigarettes  . Smokeless tobacco: Never Used  . Alcohol use No     Comment: pt reports quitting after learning about cirrhosis    FAMILY HISTORY:  Family History  Problem Relation Age of Onset  . Colon cancer Mother   . Cancer Father   . Cancer Sister     DRUG ALLERGIES: No Known Allergies  REVIEW OF SYSTEMS:   CONSTITUTIONAL: No fever, has weakness.  EYES: No blurred or double vision.  EARS, NOSE, AND THROAT: No tinnitus or ear pain.  RESPIRATORY: No cough, shortness of breath, wheezing or hemoptysis.  CARDIOVASCULAR: No chest pain, orthopnea, edema.  GASTROINTESTINAL: No nausea, vomiting,  has abdominal pain and rectal bleed.  GENITOURINARY: No dysuria, hematuria.  ENDOCRINE: No polyuria, nocturia,  HEMATOLOGY: Has anemia, and rectal bleeding SKIN: No rash or lesion. MUSCULOSKELETAL: No joint pain or arthritis.   NEUROLOGIC: No tingling, numbness, weakness.  PSYCHIATRY: No anxiety or depression.   MEDICATIONS AT HOME:  Prior to Admission medications   Medication Sig  Start Date End Date Taking? Authorizing Provider  amLODipine (NORVASC) 5 MG tablet Take 5 mg by mouth daily.   Yes Historical Provider, MD  atorvastatin (LIPITOR) 40 MG tablet Take 40 mg by mouth daily. 04/19/16  Yes Historical Provider, MD  b complex-vitamin c-folic acid (NEPHRO-VITE) 0.8 MG TABS tablet Take 1 tablet by mouth daily.   Yes Historical Provider, MD  budesonide-formoterol (SYMBICORT) 160-4.5 MCG/ACT inhaler Inhale 2 puffs into the lungs daily.   Yes Historical Provider, MD  furosemide (LASIX) 80 MG tablet Take 80 mg by mouth daily. 05/11/16  Yes Historical Provider, MD  gabapentin (NEURONTIN) 300 MG capsule Take 1 capsule (300 mg total) by mouth daily. Patient taking differently: Take 300 mg by mouth at bedtime.  03/27/15  Yes Bettey Costa, MD  hydrOXYzine (VISTARIL) 25 MG capsule Take 1 capsule (25 mg total) by mouth 3 (three) times daily as needed for itching. 02/25/16  Yes Jami L Hagler, PA-C  labetalol (NORMODYNE) 100 MG tablet Take 1 tablet (100 mg total) by mouth daily. 01/22/16  Yes Epifanio Lesches, MD  Multiple Vitamins-Minerals-FA (DIALYVITE SUPREME D) 3 MG TABS Take 1 tablet by mouth daily. 05/27/16  Yes Historical Provider, MD  mupirocin ointment (BACTROBAN) 2 %  Place 1 application into the nose 2 (two) times daily. 06/01/16  Yes Theodoro Grist, MD  naproxen (NAPROSYN) 500 MG tablet Take 500 mg by mouth 2 (two) times daily as needed. for pain 04/28/16  Yes Historical Provider, MD  nitroGLYCERIN (NITROSTAT) 0.4 MG SL tablet Place 1 tablet (0.4 mg total) under the tongue every 5 (five) minutes as needed. Patient taking differently: Place 0.4 mg under the tongue every 5 (five) minutes as needed for chest pain.  04/12/16  Yes Wende Bushy, MD  oxyCODONE-acetaminophen (ROXICET) 5-325 MG tablet Take 1-2 tablets by mouth every 4 (four) hours as needed for severe pain. 04/21/16  Yes Hinda Kehr, MD  pantoprazole (PROTONIX) 40 MG tablet Take 1 tablet (40 mg total) by mouth daily. 05/10/16  Yes  Shanker Kristeen Mans, MD  sevelamer carbonate (RENVELA) 800 MG tablet Take 3 tablets (2,400 mg total) by mouth 3 (three) times daily with meals. 06/01/16  Yes Theodoro Grist, MD      PHYSICAL EXAMINATION:   VITAL SIGNS: Blood pressure (!) 148/78, pulse 98, temperature 98.4 F (36.9 C), temperature source Oral, resp. rate 18, height 6\' 3"  (1.905 m), weight 83.9 kg (185 lb), SpO2 93 %.  GENERAL:  56 y.o.-year-old patient lying in the bed with no acute distress.  EYES: Pupils equal, round, reactive to light and accommodation. No scleral icterus, has pallor. Extraocular muscles intact.  HEENT: Head atraumatic, normocephalic. Oropharynx and nasopharynx clear.  NECK:  Supple, no jugular venous distention. No thyroid enlargement, no tenderness.  LUNGS: Normal breath sounds bilaterally, no wheezing, rales,rhonchi or crepitation. No use of accessory muscles of respiration.  CARDIOVASCULAR: S1, S2 normal. No murmurs, rubs, or gallops.  ABDOMEN: Soft, mild tenderness around umbilicus, distended secondary to ascites. Bowel sounds present. No organomegaly or mass.  EXTREMITIES: Has pedal edema,  No cyanosis, or clubbing.  NEUROLOGIC: Cranial nerves II through XII are intact. Muscle strength 5/5 in all extremities. Sensation intact. Gait not checked.  PSYCHIATRIC: The patient is alert and oriented x 3.  SKIN: No obvious rash, lesion, or ulcer.   LABORATORY PANEL:   CBC  Recent Labs Lab 06/17/16 0524 06/18/16 0302 06/19/16 1115 06/24/16 0208  WBC 7.5 8.9 6.1 8.1  HGB 7.4* 8.4* 9.1* 7.5*  HCT 22.8* 25.8* 28.1* 23.4*  PLT 230 241 259 218  MCV 83.0 83.1 83.6 86.1  MCH 27.0 27.2 27.0 27.5  MCHC 32.5 32.7 32.3 32.0  RDW 16.8* 16.3* 16.5* 17.6*   ------------------------------------------------------------------------------------------------------------------  Chemistries   Recent Labs Lab 06/17/16 0524 06/18/16 0302 06/24/16 0208  NA 136 138 141  K 4.1 4.6 4.5  CL 97* 99* 99*  CO2 29 28  30   GLUCOSE 86 89 127*  BUN 39* 45* 38*  CREATININE 5.99* 6.95* 6.61*  CALCIUM 8.1* 8.3* 8.7*  AST  --   --  37  ALT  --   --  20  ALKPHOS  --   --  134*  BILITOT  --   --  0.6   ------------------------------------------------------------------------------------------------------------------ estimated creatinine clearance is 14.6 mL/min (A) (by C-G formula based on SCr of 6.61 mg/dL (H)). ------------------------------------------------------------------------------------------------------------------ No results for input(s): TSH, T4TOTAL, T3FREE, THYROIDAB in the last 72 hours.  Invalid input(s): FREET3   Coagulation profile No results for input(s): INR, PROTIME in the last 168 hours. ------------------------------------------------------------------------------------------------------------------- No results for input(s): DDIMER in the last 72 hours. -------------------------------------------------------------------------------------------------------------------  Cardiac Enzymes No results for input(s): CKMB, TROPONINI, MYOGLOBIN in the last 168 hours.  Invalid input(s): CK ------------------------------------------------------------------------------------------------------------------ Invalid input(s):  POCBNP  ---------------------------------------------------------------------------------------------------------------  Urinalysis    Component Value Date/Time   COLORURINE YELLOW (A) 05/03/2015 0815   APPEARANCEUR CLEAR (A) 05/03/2015 0815   APPEARANCEUR Clear 11/13/2013 1314   LABSPEC 1.008 05/03/2015 0815   LABSPEC 1.014 11/13/2013 1314   PHURINE 9.0 (H) 05/03/2015 0815   GLUCOSEU NEGATIVE 05/03/2015 0815   GLUCOSEU 50 mg/dL 11/13/2013 1314   HGBUR NEGATIVE 05/03/2015 0815   BILIRUBINUR NEGATIVE 05/03/2015 0815   BILIRUBINUR Negative 11/13/2013 1314   KETONESUR NEGATIVE 05/03/2015 0815   PROTEINUR 100 (A) 05/03/2015 0815   UROBILINOGEN 0.2 06/22/2014 1230    NITRITE NEGATIVE 05/03/2015 0815   LEUKOCYTESUR NEGATIVE 05/03/2015 0815   LEUKOCYTESUR Negative 11/13/2013 1314     RADIOLOGY: No results found.  EKG: Orders placed or performed during the hospital encounter of 06/24/16  . EKG 12-Lead  . EKG 12-Lead    IMPRESSION AND PLAN: 56 year old male patient with history of  anemia, cirrhosis of liver, alcohol abuse, substance abuse, end-stage renal disease on dialysis presented to the emergency room with rectal bleed. Admitting diagnosis 1. Acute gastrointestinal bleeding 2. Symptomatic anemia 3. Cirrhosis of liver 4. Substance abuse 5. End-stage renal disease on dialysis Treatment plan Admit patient to telemetry Transfuse 2 units of packed red blood cells intravenously Gastroenterology consultation for possible repeat colonoscopy Monitor hemoglobin and hematocrit Keep patient nothing by mouth IV fluid hydration Monitor electrolytes Paracentesis once rectal bleed is resolved Supportive care All the records are reviewed and case discussed with ED provider. Management plans discussed with the patient, family and they are in agreement.  CODE STATUS:FULL CODE Code Status History    Date Active Date Inactive Code Status Order ID Comments User Context   06/14/2016  2:53 PM 06/19/2016  9:04 PM Full Code [id number]  Hillary Bow, MD ED   05/31/2016  6:42 AM 06/01/2016  9:30 PM Full Code [id number]  Saundra Shelling, MD Inpatient   05/05/2016 11:09 PM 05/10/2016  9:57 PM Full Code [id number]  Vianne Bulls, MD Inpatient   04/16/2016  3:56 AM 04/17/2016  6:27 PM Full Code [id number]  Saundra Shelling, MD Inpatient   01/19/2016  3:13 PM 01/22/2016  2:14 PM Full Code [id number]  Loletha Grayer, MD ED   09/24/2015 10:33 AM 09/25/2015  1:31 PM Full Code [id number]  Loletha Grayer, MD ED   05/16/2015  2:17 PM 05/17/2015  8:57 PM Full Code [id number]  Idelle Crouch, MD Inpatient   04/30/2015  8:08 PM 05/08/2015  7:40 PM Full Code [id number]  Vaughan Basta, MD  Inpatient   04/06/2015  5:07 AM 04/07/2015  5:36 PM Full Code [id number]  Lance Coon, MD Inpatient   03/23/2015 11:07 AM 03/27/2015 12:46 PM Full Code [id number]  Bettey Costa, MD Inpatient   06/19/2013 11:32 AM 06/21/2013  5:49 PM Full Code [id number]  Cristal Ford, DO Inpatient   06/16/2013  5:26 PM 06/19/2013 11:32 AM Full Code [id number]  Margarita Mail, PA-C ED   06/05/2013  3:33 PM 06/06/2013  6:56 PM Full Code [id number]  [place], DO Inpatient   06/02/2013  9:01 AM 06/05/2013  3:33 PM Full Code [id number]  Velvet Bathe, MD Inpatient   06/02/2013  8:40 AM 06/02/2013  9:01 AM Full Code [id number]  Velvet Bathe, MD Inpatient       TOTAL TIME TAKING CARE OF THIS PATIENT: 54 minutes.    Saundra Shelling M.D on 06/24/2016 at 3:21 AM  Between 7am to 6pm - Pager - [phone]  After 6pm go to [email or web address] - password  [email or web address] [facility]  [facility]  Office  [phone]  CC: Primary care physician; Elyse Jarvis, MD

## 2016-06-24 NOTE — Progress Notes (Signed)
[facility]  ROUNDING NOTE   Subjective:   [name] admitted to [facility] on 06/24/2016 for Rectal bleeding [K62.5] Iron deficiency anemia, unspecified iron deficiency anemia type [D50.9] Stage 5 chronic kidney disease on chronic dialysis ([place]) [N18.6, Z99.2]   Last dialysis yesterday.   Objective:  Vital signs in last 24 hours:  Temp:  [98.1 F (36.7 C)-98.6 F (37 C)] 98.6 F (37 C) (04/13 1017) Pulse Rate:  [89-98] 89 (04/13 0908) Resp:  [14-20] 20 (04/13 0908) BP: (126-148)/(74-90) 126/74 (04/13 1017) SpO2:  [93 %-99 %] 99 % (04/13 0908) Weight:  [83.9 kg (185 lb)-85.9 kg (189 lb 4.8 oz)] 85.9 kg (189 lb 4.8 oz) (04/13 0422)  Weight change:  Filed Weights   06/24/16 0158 06/24/16 0422  Weight: 83.9 kg (185 lb) 85.9 kg (189 lb 4.8 oz)    Intake/Output: No intake/output data recorded.   Intake/Output this shift:  No intake/output data recorded.  Physical Exam: General: No acute distress  Head: Normocephalic, atraumatic. Moist oral mucosal membranes  Eyes: Anicteric  Neck: Supple, trachea midline  Lungs:  Clear to auscultation, normal effort  Heart: S1S2 no rubs  Abdomen:  Soft, nontender, mild distension  Extremities: Trace peripheral edema.  Neurologic: Nonfocal, moving all four extremities  Skin: No lesions  Access: RUE AVF    Basic Metabolic Panel:  Recent Labs Lab 06/18/16 0302 06/24/16 0208  NA 138 141  K 4.6 4.5  CL 99* 99*  CO2 28 30  GLUCOSE 89 127*  BUN 45* 38*  CREATININE 6.95* 6.61*  CALCIUM 8.3* 8.7*    Liver Function Tests:  Recent Labs Lab 06/24/16 0208  AST 37  ALT 20  ALKPHOS 134*  BILITOT 0.6  PROT 7.1  ALBUMIN 3.1*   No results for input(s): LIPASE, AMYLASE in the last 168 hours. No results for input(s): AMMONIA in the last 168 hours.  CBC:  Recent Labs Lab 06/18/16 0302 06/19/16 1115 06/24/16 0208  WBC 8.9 6.1 8.1  HGB 8.4* 9.1* 7.5*  HCT 25.8* 28.1* 23.4*  MCV 83.1 83.6 86.1  PLT 241  259 218    Cardiac Enzymes: No results for input(s): CKTOTAL, CKMB, CKMBINDEX, TROPONINI in the last 168 hours.  BNP: Invalid input(s): POCBNP  CBG:  Recent Labs Lab 06/18/16 1734 06/18/16 2049 06/18/16 2128 06/19/16 0717 06/19/16 1143  GLUCAP 72 74 92 83 97    Microbiology: Results for orders placed or performed during the hospital encounter of 05/30/16  MRSA PCR Screening     Status: Abnormal   Collection Time: 05/31/16  6:45 AM  Result Value Ref Range Status   MRSA by PCR POSITIVE (A) NEGATIVE Final    Comment:        The GeneXpert MRSA Assay (FDA approved for NASAL specimens only), is one component of a comprehensive MRSA colonization surveillance program. It is not intended to diagnose MRSA infection nor to guide or monitor treatment for MRSA infections. RESULT CALLED TO, READ BACK BY AND VERIFIED WITH:  BRAD CHISMON AT 9323 05/31/16 SDR   Body fluid culture     Status: None   Collection Time: 05/31/16  3:20 PM  Result Value Ref Range Status   Specimen Description PERITONEAL  Final   Special Requests NONE  Final   Gram Stain   Final    FEW WBC PRESENT, PREDOMINANTLY MONONUCLEAR NO ORGANISMS SEEN    Culture   Final    NO GROWTH 3 DAYS Performed at [facility], [address] [place], [place] [address]    Report Status 06/04/2016 FINAL  Final    Coagulation Studies: No results for input(s): LABPROT, INR in the last 72 hours.  Urinalysis: No results for input(s): COLORURINE, LABSPEC, PHURINE, GLUCOSEU, HGBUR, BILIRUBINUR, KETONESUR, PROTEINUR, UROBILINOGEN, NITRITE, LEUKOCYTESUR in the last 72 hours.  Invalid input(s): APPERANCEUR    Imaging: No results found.   Medications:    . mometasone-formoterol  2 puff Inhalation BID  . pantoprazole (PROTONIX) IV  40 mg Intravenous Q12H  . sevelamer carbonate  2,400 mg Oral TID WC  . sodium chloride flush  3 mL Intravenous Q12H   morphine injection, ondansetron **OR** ondansetron (ZOFRAN)  IV  Assessment/ Plan:  56 y.o. male with ESRD on hemodialysis, hypertension, peripheral vascular disease, coronary artery disease, cirrhosis, substance abuse, recurrent GI bleed  TTS [facility] [place]. Right arm AVF   1. End stage renal disease: TTS. Last dialysis yesterday.  Next treatment for Saturday  2. Hypertension: blood pressure at goal. Currently holding medications.   3. Anemia of chronic kidney disease with GI blood loss: multiple endoscopies in last few months. Hemoglobin 7.5  - PRBC transfusion ordered - [facility] as outpatient - Appreciate GI input.   4. Secondary Hyperparathyroidism:  - sevelamer for binding   LOS: 0 Alycen Mack 4/13/201811:08 AM

## 2016-06-24 NOTE — Care Management (Signed)
Amanda Morris HD liaison notified of admission.  

## 2016-06-24 NOTE — Progress Notes (Signed)
Dr Ara Kussmaul paged regarding pt's anxiety, orders given.

## 2016-06-24 NOTE — Progress Notes (Signed)
[facility] [place] NAME: Stanley [name]    MR#:  [id number]  DATE OF BIRTH:  01-18-1961  SUBJECTIVE:  CHIEF COMPLAINT:   Chief Complaint  Patient presents with  . Rectal Bleeding     Recurrent admissions for GI bleed, multiple tiomes EGD and colonoscopy and capsule endoscopy done.   Came again with multiple bowel movements with fresh blood.   Received Blood transfusion, no pain.  REVIEW OF SYSTEMS:  CONSTITUTIONAL: No fever, fatigue or weakness.  EYES: No blurred or double vision.  EARS, NOSE, AND THROAT: No tinnitus or ear pain.  RESPIRATORY: No cough, shortness of breath, wheezing or hemoptysis.  CARDIOVASCULAR: No chest pain, orthopnea, edema.  GASTROINTESTINAL: No nausea, vomiting, diarrhea or abdominal pain.  GENITOURINARY: No dysuria, hematuria.  ENDOCRINE: No polyuria, nocturia,  HEMATOLOGY: No anemia, easy bruising or bleeding SKIN: No rash or lesion. MUSCULOSKELETAL: No joint pain or arthritis.   NEUROLOGIC: No tingling, numbness, weakness.  PSYCHIATRY: No anxiety or depression.   ROS  DRUG ALLERGIES:  No Known Allergies  VITALS:  Blood pressure 123/65, pulse 87, temperature 98.1 F (36.7 C), temperature source Oral, resp. rate 20, height 6\' 3"  (1.905 m), weight 85.9 kg (189 lb 4.8 oz), SpO2 99 %.  PHYSICAL EXAMINATION:  GENERAL:  56 y.o.-year-old patient lying in the bed with no acute distress.  EYES: Pupils equal, round, reactive to light and accommodation. No scleral icterus. Extraocular muscles intact.  HEENT: Head atraumatic, normocephalic. Oropharynx and nasopharynx clear.  NECK:  Supple, no jugular venous distention. No thyroid enlargement, no tenderness.  LUNGS: Normal breath sounds bilaterally, no wheezing, rales,rhonchi or crepitation. No use of accessory muscles of respiration.  CARDIOVASCULAR: S1, S2 normal. No murmurs, rubs, or gallops.  ABDOMEN: Soft, nontender, nondistended. Bowel sounds present. No  organomegaly or mass.  EXTREMITIES: No pedal edema, cyanosis, or clubbing.  NEUROLOGIC: Cranial nerves II through XII are intact. Muscle strength 5/5 in all extremities. Sensation intact. Gait not checked.  PSYCHIATRIC: The patient is alert and oriented x 3.  SKIN: No obvious rash, lesion, or ulcer.   Physical Exam LABORATORY PANEL:   CBC  Recent Labs Lab 06/24/16 0208  WBC 8.1  HGB 7.5*  HCT 23.4*  PLT 218   ------------------------------------------------------------------------------------------------------------------  Chemistries   Recent Labs Lab 06/24/16 0208  NA 141  K 4.5  CL 99*  CO2 30  GLUCOSE 127*  BUN 38*  CREATININE 6.61*  CALCIUM 8.7*  AST 37  ALT 20  ALKPHOS 134*  BILITOT 0.6   ------------------------------------------------------------------------------------------------------------------  Cardiac Enzymes No results for input(s): TROPONINI in the last 168 hours. ------------------------------------------------------------------------------------------------------------------  RADIOLOGY:  No results found.  ASSESSMENT AND PLAN:   Active Problems:   GI bleed   GI bleeding  * GI bleed- acute on ch anemia due to blood loss   Likely from Small intestine as Upper anf Lower GI scope did not show clear source.   After last colonoscopy 06/19/16- GI suggested- to do bleeding scan in further episodes.   Will do bleeding scan today.   Receiving PRBC today.  * ESRD on HD  nephrology to help with HD.  * Hypertension   Cont home meds  * Liver cirrhosis   Plt normal.   Will check INR.    All the records are reviewed and case discussed with Care Management/Social Workerr. Management plans discussed with the patient, family and they are in agreement.  CODE STATUS: Full  TOTAL TIME TAKING CARE OF  THIS PATIENT: 35 minutes.     POSSIBLE D/C IN 2-3 DAYS, DEPENDING ON CLINICAL CONDITION.   Vaughan Basta M.D on 06/24/2016    Between 7am to 6pm - Pager - [phone]  After 6pm go to [email or web address] - password [email or web address] [facility]  Office  [phone]  CC: Primary care physician; Elyse Jarvis, MD  Note: This dictation was prepared with Dragon dictation along with smaller phrase technology. Any transcriptional errors that result from this process are unintentional.

## 2016-06-24 NOTE — ED Triage Notes (Signed)
Pt comes into the ED via EMS from home c/o rectal bleeding that has occurred off and on for over 2 weeks with bright red blood.  Patient denies the bleeding being any worse today.  Patient is a dialysis patient on Tuesday/Thursday/Saturday.  Patient in NAD at this time with even and unlabored respirations.

## 2016-06-25 LAB — CBC
HEMATOCRIT: 27.4 % — AB (ref 40.0–52.0)
Hemoglobin: 9.1 g/dL — ABNORMAL LOW (ref 13.0–18.0)
MCH: 28.7 pg (ref 26.0–34.0)
MCHC: 33.3 g/dL (ref 32.0–36.0)
MCV: 86.3 fL (ref 80.0–100.0)
Platelets: 216 10*3/uL (ref 150–440)
RBC: 3.17 MIL/uL — AB (ref 4.40–5.90)
RDW: 17.5 % — ABNORMAL HIGH (ref 11.5–14.5)
WBC: 7 10*3/uL (ref 3.8–10.6)

## 2016-06-25 LAB — TYPE AND SCREEN
ABO/RH(D): A POS
Antibody Screen: NEGATIVE
Unit division: 0
Unit division: 0

## 2016-06-25 LAB — BPAM RBC
BLOOD PRODUCT EXPIRATION DATE: 201804282359
Blood Product Expiration Date: 201804202359
ISSUE DATE / TIME: 201804130531
ISSUE DATE / TIME: 201804131100
UNIT TYPE AND RH: 6200
Unit Type and Rh: 600

## 2016-06-25 LAB — RENAL FUNCTION PANEL
ANION GAP: 10 (ref 5–15)
Albumin: 2.9 g/dL — ABNORMAL LOW (ref 3.5–5.0)
BUN: 49 mg/dL — ABNORMAL HIGH (ref 6–20)
CHLORIDE: 99 mmol/L — AB (ref 101–111)
CO2: 27 mmol/L (ref 22–32)
Calcium: 8.8 mg/dL — ABNORMAL LOW (ref 8.9–10.3)
Creatinine, Ser: 8.16 mg/dL — ABNORMAL HIGH (ref 0.61–1.24)
GFR calc non Af Amer: 6 mL/min — ABNORMAL LOW (ref >60)
GFR, EST AFRICAN AMERICAN: 7 mL/min — AB (ref >60)
Glucose, Bld: 84 mg/dL (ref 65–99)
POTASSIUM: 4.7 mmol/L (ref 3.5–5.1)
Phosphorus: 3.9 mg/dL (ref 2.5–4.6)
Sodium: 136 mmol/L (ref 135–145)

## 2016-06-25 MED ORDER — NICOTINE 21 MG/24HR TD PT24
21.0000 mg | MEDICATED_PATCH | Freq: Every day | TRANSDERMAL | Status: DC
  Administered 2016-06-26: 21 mg via TRANSDERMAL
  Filled 2016-06-25: qty 1

## 2016-06-25 NOTE — Progress Notes (Signed)
PRE DIALYSIS ASSESSMENT 

## 2016-06-25 NOTE — Progress Notes (Signed)
[facility]  ROUNDING NOTE   Subjective:   Seen and examined on hemodialysis. Tolerating treatment well. UF of 1.5 litres    HEMODIALYSIS FLOWSHEET:  Blood Flow Rate (mL/min): 400 mL/min Arterial Pressure (mmHg): -110 mmHg Venous Pressure (mmHg): 210 mmHg Transmembrane Pressure (mmHg): 50 mmHg Ultrafiltration Rate (mL/min): 670 mL/min Dialysate Flow Rate (mL/min): 600 ml/min Conductivity: Machine : 14 Conductivity: Machine : 14 Dialysis Fluid Bolus: Normal Saline Bolus Amount (mL): 250 mL Intra-Hemodialysis Comments: 1133ml resting with eyes open    Objective:  Vital signs in last 24 hours:  Temp:  [97.7 F (36.5 C)-98.6 F (37 C)] 97.9 F (36.6 C) (04/14 1045) Pulse Rate:  [73-89] 82 (04/14 1200) Resp:  [12-20] 15 (04/14 1200) BP: (107-162)/(65-103) 107/96 (04/14 1200) SpO2:  [92 %-100 %] 100 % (04/14 1045) Weight:  [88.9 kg (195 lb 15.8 oz)] 88.9 kg (195 lb 15.8 oz) (04/14 1045)  Weight change:  Filed Weights   06/24/16 0158 06/24/16 0422 06/25/16 1045  Weight: 83.9 kg (185 lb) 85.9 kg (189 lb 4.8 oz) 88.9 kg (195 lb 15.8 oz)    Intake/Output: I/O last 3 completed shifts: In: 809 [P.O.:180; Blood:644] Out: 200 [Urine:200]   Intake/Output this shift:  Total I/O In: 100 [P.O.:100] Out: -   Physical Exam: General: No acute distress  Head: Normocephalic, atraumatic. Moist oral mucosal membranes  Eyes: Anicteric  Neck: Supple, trachea midline  Lungs:  Clear to auscultation, normal effort  Heart: S1S2 no rubs  Abdomen:  Soft, nontender, mild distension  Extremities: Trace peripheral edema.  Neurologic: Nonfocal, moving all four extremities  Skin: No lesions  Access: RUE AVF    Basic Metabolic Panel:  Recent Labs Lab 06/24/16 0208 06/25/16 0428  NA 141 136  K 4.5 4.7  CL 99* 99*  CO2 30 27  GLUCOSE 127* 84  BUN 38* 49*  CREATININE 6.61* 8.16*  CALCIUM 8.7* 8.8*  PHOS  --  3.9    Liver Function Tests:  Recent Labs Lab  06/24/16 0208 06/25/16 0428  AST 37  --   ALT 20  --   ALKPHOS 134*  --   BILITOT 0.6  --   PROT 7.1  --   ALBUMIN 3.1* 2.9*   No results for input(s): LIPASE, AMYLASE in the last 168 hours. No results for input(s): AMMONIA in the last 168 hours.  CBC:  Recent Labs Lab 06/19/16 1115 06/24/16 0208 06/24/16 1854 06/25/16 0428  WBC 6.1 8.1  --  7.0  HGB 9.1* 7.5* 10.3* 9.1*  HCT 28.1* 23.4* 32.4* 27.4*  MCV 83.6 86.1  --  86.3  PLT 259 218  --  216    Cardiac Enzymes: No results for input(s): CKTOTAL, CKMB, CKMBINDEX, TROPONINI in the last 168 hours.  BNP: Invalid input(s): POCBNP  CBG:  Recent Labs Lab 06/18/16 1734 06/18/16 2049 06/18/16 2128 06/19/16 0717 06/19/16 1143  GLUCAP 72 74 92 83 97    Microbiology: Results for orders placed or performed during the hospital encounter of 05/30/16  MRSA PCR Screening     Status: Abnormal   Collection Time: 05/31/16  6:45 AM  Result Value Ref Range Status   MRSA by PCR POSITIVE (A) NEGATIVE Final    Comment:        The GeneXpert MRSA Assay (FDA approved for NASAL specimens only), is one component of a comprehensive MRSA colonization surveillance program. It is not intended to diagnose MRSA infection nor to guide or monitor treatment for MRSA infections. RESULT CALLED  TO, READ BACK BY AND VERIFIED WITH:  BRAD CHISMON AT 2831 05/31/16 SDR   Body fluid culture     Status: None   Collection Time: 05/31/16  3:20 PM  Result Value Ref Range Status   Specimen Description PERITONEAL  Final   Special Requests NONE  Final   Gram Stain   Final    FEW WBC PRESENT, PREDOMINANTLY MONONUCLEAR NO ORGANISMS SEEN    Culture   Final    NO GROWTH 3 DAYS Performed at [facility], [address], [place], [place] [address]    Report Status 06/04/2016 FINAL  Final    Coagulation Studies:  Recent Labs  06/24/16 1854  LABPROT 14.9  INR 1.16    Urinalysis: No results for input(s): COLORURINE, LABSPEC,  PHURINE, GLUCOSEU, HGBUR, BILIRUBINUR, KETONESUR, PROTEINUR, UROBILINOGEN, NITRITE, LEUKOCYTESUR in the last 72 hours.  Invalid input(s): APPERANCEUR    Imaging: Nm Gi Blood Loss  Result Date: 06/24/2016 CLINICAL DATA:  Gastrointestinal hemorrhage. EXAM: NUCLEAR MEDICINE GASTROINTESTINAL BLEEDING SCAN TECHNIQUE: Sequential abdominal images were obtained following intravenous administration of Tc-40m labeled red blood cells. RADIOPHARMACEUTICALS:  21.1 mCi Tc-69m in-vitro labeled red cells. COMPARISON:  Ultrasound dated 05/31/2016 FINDINGS: Initial flow images normal. There is some vague and low-grade activity probably corresponding to proximal small bowel in the left upper quadrant on the initials power series. This appears to continue on the delayed series. The appearance suggests a possible upper abdominal source, such as stomach or small bowel, although the activity is 2 vague to make out much more detail. IMPRESSION: 1. Today's exam is positive although weakly so. The earliest visible activity projected to be in bowel is in the left upper quadrant, raising the possibility of a gastric or small bowel source based on overall appearance. Electronically Signed   By: Van Clines M.D.   On: 06/24/2016 18:10     Medications:    . gabapentin  300 mg Oral QHS  . mometasone-formoterol  2 puff Inhalation BID  . pantoprazole (PROTONIX) IV  40 mg Intravenous Q12H  . sevelamer carbonate  2,400 mg Oral TID WC  . sodium chloride flush  3 mL Intravenous Q12H   ALPRAZolam, morphine injection, ondansetron **OR** ondansetron (ZOFRAN) IV  Assessment/ Plan:  56 y.o. male with ESRD on hemodialysis, hypertension, peripheral vascular disease, coronary artery disease, cirrhosis, substance abuse, recurrent GI bleed  TTS [facility]. Right arm AVF   1. End stage renal disease: TTS. Seen and examined on hemodialysis. Tolerating treatment well.   2. Hypertension: blood pressure at goal. Currently  holding medications.   3. Anemia of chronic kidney disease with GI blood loss: status post 2 units PRBC - [facility] as outpatient - Appreciate GI input. May need transfer to tertiary medical center.   4. Secondary Hyperparathyroidism:  - sevelamer for binding   LOS: [place], Caydan Mctavish 4/14/201812:18 PM

## 2016-06-25 NOTE — Consult Note (Signed)
Referring Provider: Dr. Anselm Jungling Primary Care Physician:  Elyse Jarvis, MD Primary Gastroenterologist:  Dr. Vicente Males  Reason for Consultation:  GI bleed  HPI: Stanley [name] is a 56 y.o. male with ESRD on hemodialysis who has obscure overt GI bleeding in the setting of alcoholic cirrhosis.  He has had multiple EGDs most recently on 06/16/16 that showed minimal gastritis and a small hiatal hernia. A small bowel endoscopy was done in February 2018 that showed a small nonbleeding jejunal AVM that was fulgurated. Candida esophagitis was also seen on that as well as mild gastritis. A poorly prepped colon on 06/18/16 showed a small tubular adenoma that was removed, sigmoid diverticulosis, and internal hemorrhoids. No blood products were seen but prep was poor. A capsule endoscopy on 06/19/16 was negative for any bleeding source or blood products. He was discharged on 06/19/16 with a Hgb 9.1.   He reports multiple black stools for the past 2 days but none since admit. Right-sided sharp abdominal pain. Felt dizzy at home. Hgb 7.5 yesterday on admit and increased to 10.3 after 2 U PRBCs. Hgb 9.1 today. Bleeding scan yesterday showed possible bleeding in the small bowel vs gastric although the report says it was weakly positive. Denies hematochezia. Reports stopping alcohol last year when he was diagnosed with cirrhosis. On Naprosyn BID and denies OTC NSAIDs. On Protonix 40 mg QD at home. Currently on dialysis and states that he wants to eat.   Past Medical History:  Diagnosis Date  . Alcohol abuse   . Cirrhosis ([place])   . Coronary artery disease 2009  . Diabetic peripheral neuropathy ([place])   . Drug abuse   . End stage renal disease on dialysis [facility]) NEPHROLOGIST-   DR [facility]  IN [place]   HEMODIALYSIS --   TUES/  THURS/  SAT  . GERD (gastroesophageal reflux disease)   . Hyperlipidemia   . Hypertension   . PAD (peripheral artery disease) ([place])   . Renal insufficiency    Per pt, 32 oz fluid restriction per  day  . S/P triple vessel bypass 06/09/2016   2009ish  . Suicidal ideation    & HOMICIDAL IDEATION --  06-16-2013   ADMITTED TO BEHAVIOR HEALTH    Past Surgical History:  Procedure Laterality Date  . AGILE CAPSULE N/A 06/19/2016   Procedure: AGILE CAPSULE;  Surgeon: [name], MD;  Location: [facility] ENDOSCOPY;  Service: Endoscopy;  Laterality: N/A;  . COLONOSCOPY WITH PROPOFOL N/A 06/18/2016   Procedure: COLONOSCOPY WITH PROPOFOL;  Surgeon: [name], MD;  Location: [facility] ENDOSCOPY;  Service: Endoscopy;  Laterality: N/A;  . CORONARY ANGIOPLASTY  ?   PT UNABLE TO TELL IF  BEFORE OR AFTER  CABG  . CORONARY ARTERY BYPASS GRAFT  2008  ([place])   3 VESSEL  . DIALYSIS FISTULA CREATION  LAST SURGERY  APPOX  2008  . ENTEROSCOPY N/A 05/10/2016   Procedure: ENTEROSCOPY;  Surgeon: Jerene Bears, MD;  Location: [place];  Service: Gastroenterology;  Laterality: N/A;  . ESOPHAGOGASTRODUODENOSCOPY N/A 05/07/2015   Procedure: ESOPHAGOGASTRODUODENOSCOPY (EGD);  Surgeon: Hulen Luster, MD;  Location[facility] ENDOSCOPY;  Service: Endoscopy;  Laterality: N/A;  . ESOPHAGOGASTRODUODENOSCOPY (EGD) WITH PROPOFOL N/A 05/17/2015   Procedure: ESOPHAGOGASTRODUODENOSCOPY (EGD) WITH PROPOFOL;  Surgeon: [name], MD;  Location: [facility] ENDOSCOPY;  Service: Endoscopy;  Laterality: N/A;  . ESOPHAGOGASTRODUODENOSCOPY (EGD) WITH PROPOFOL N/A 01/20/2016   Procedure: ESOPHAGOGASTRODUODENOSCOPY (EGD) WITH PROPOFOL;  Surgeon: [name], MD;  Location: [facility] ENDOSCOPY;  Service: Endoscopy;  Laterality:  N/A;  . ESOPHAGOGASTRODUODENOSCOPY (EGD) WITH PROPOFOL N/A 04/17/2016   Procedure: ESOPHAGOGASTRODUODENOSCOPY (EGD) WITH PROPOFOL;  Surgeon: Lin Landsman, MD;  Location: [facility] ENDOSCOPY;  Service: Gastroenterology;  Laterality: N/A;  . ESOPHAGOGASTRODUODENOSCOPY (EGD) WITH PROPOFOL  05/09/2016   Procedure: ESOPHAGOGASTRODUODENOSCOPY (EGD) WITH PROPOFOL;  Surgeon: Jerene Bears, MD;  Location: [place];  Service: Endoscopy;;  .  ESOPHAGOGASTRODUODENOSCOPY (EGD) WITH PROPOFOL N/A 06/16/2016   Procedure: ESOPHAGOGASTRODUODENOSCOPY (EGD) WITH PROPOFOL;  Surgeon: [name], MD;  Location: [facility] ENDOSCOPY;  Service: Endoscopy;  Laterality: N/A;  . GIVENS CAPSULE STUDY N/A 05/07/2016   Procedure: GIVENS CAPSULE STUDY;  Surgeon: Doran Stabler, MD;  Location: [place];  Service: Endoscopy;  Laterality: N/A;  . MANDIBULAR HARDWARE REMOVAL N/A 07/29/2013   Procedure: REMOVAL OF ARCH BARS;  Surgeon: Theodoro Kos, DO;  Location: [place];  Service: Plastics;  Laterality: N/A;  . ORIF MANDIBULAR FRACTURE N/A 06/05/2013   Procedure: REPAIR OF MANDIBULAR FRACTURE x 2 with maxillo-mandibular fixation ;  Surgeon: Theodoro Kos, DO;  Location: [place];  Service: Plastics;  Laterality: N/A;  . PERIPHERAL ARTERIAL STENT GRAFT Left     Prior to Admission medications   Medication Sig Start Date End Date Taking? Authorizing Provider  amLODipine (NORVASC) 5 MG tablet Take 5 mg by mouth daily.   Yes Historical Provider, MD  atorvastatin (LIPITOR) 40 MG tablet Take 40 mg by mouth daily. 04/19/16  Yes Historical Provider, MD  b complex-vitamin c-folic acid (NEPHRO-VITE) 0.8 MG TABS tablet Take 1 tablet by mouth daily.   Yes Historical Provider, MD  budesonide-formoterol (SYMBICORT) 160-4.5 MCG/ACT inhaler Inhale 2 puffs into the lungs daily.   Yes Historical Provider, MD  furosemide (LASIX) 80 MG tablet Take 80 mg by mouth daily. 05/11/16  Yes Historical Provider, MD  gabapentin (NEURONTIN) 300 MG capsule Take 1 capsule (300 mg total) by mouth daily. Patient taking differently: Take 300 mg by mouth at bedtime.  03/27/15  Yes Bettey Costa, MD  hydrOXYzine (VISTARIL) 25 MG capsule Take 1 capsule (25 mg total) by mouth 3 (three) times daily as needed for itching. 02/25/16  Yes Jami L Hagler, PA-C  labetalol (NORMODYNE) 100 MG tablet Take 1 tablet (100 mg total) by mouth daily. 01/22/16  Yes Epifanio Lesches, MD  Multiple  Vitamins-Minerals-FA (DIALYVITE SUPREME D) 3 MG TABS Take 1 tablet by mouth daily. 05/27/16  Yes Historical Provider, MD  mupirocin ointment (BACTROBAN) 2 % Place 1 application into the nose 2 (two) times daily. 06/01/16  Yes Theodoro Grist, MD  naproxen (NAPROSYN) 500 MG tablet Take 500 mg by mouth 2 (two) times daily as needed. for pain 04/28/16  Yes Historical Provider, MD  nitroGLYCERIN (NITROSTAT) 0.4 MG SL tablet Place 1 tablet (0.4 mg total) under the tongue every 5 (five) minutes as needed. Patient taking differently: Place 0.4 mg under the tongue every 5 (five) minutes as needed for chest pain.  04/12/16  Yes Wende Bushy, MD  oxyCODONE-acetaminophen (ROXICET) 5-325 MG tablet Take 1-2 tablets by mouth every 4 (four) hours as needed for severe pain. 04/21/16  Yes Hinda Kehr, MD  pantoprazole (PROTONIX) 40 MG tablet Take 1 tablet (40 mg total) by mouth daily. 05/10/16  Yes Shanker Kristeen Mans, MD  sevelamer carbonate (RENVELA) 800 MG tablet Take 3 tablets (2,400 mg total) by mouth 3 (three) times daily with meals. 06/01/16  Yes Theodoro Grist, MD    Scheduled Meds: . gabapentin  300 mg Oral QHS  . mometasone-formoterol  2 puff Inhalation  BID  . pantoprazole (PROTONIX) IV  40 mg Intravenous Q12H  . sevelamer carbonate  2,400 mg Oral TID WC  . sodium chloride flush  3 mL Intravenous Q12H   Continuous Infusions: PRN Meds:.ALPRAZolam, morphine injection, ondansetron **OR** ondansetron (ZOFRAN) IV  Allergies as of 06/24/2016  . (No Known Allergies)    Family History  Problem Relation Age of Onset  . Colon cancer Mother   . Cancer Father   . Cancer Sister     Social History   Social History  . Marital status: Single    Spouse name: N/A  . Number of children: N/A  . Years of education: N/A   Occupational History  . disabled    Social History Main Topics  . Smoking status: Current Every Day Smoker    Packs/day: 0.15    Years: 40.00    Types: Cigarettes  . Smokeless tobacco: Never  Used  . Alcohol use No     Comment: pt reports quitting after learning about cirrhosis  . Drug use: Yes    Frequency: 7.0 times per week    Types: Marijuana, Cocaine  . Sexual activity: No   Other Topics Concern  . Not on file   Social History Narrative  . No narrative on file    Review of Systems: All negative except as stated above in HPI.  Physical Exam: Vital signs: Vitals:   06/25/16 1045 06/25/16 1100  BP: 133/87 122/83  Pulse: 84 78  Resp: 15 16  Temp: 97.9 F (36.6 C)    Last BM Date: 06/24/16 General:   Lethargic, no acute distress, Well-developed, well-nourished HEENT: anicteric sclera, oropharynx clear Neck: supple, nontender Lungs:  Clear throughout to auscultation.   No wheezes, crackles, or rhonchi. No acute distress. Heart:  Regular rate and rhythm; no murmurs, clicks, rubs,  or gallops. Abdomen: right-sided tenderness with guarding, soft, nondistended, +BS  Rectal:  Deferred Ext: no edema  GI:  Lab Results:  Recent Labs  06/24/16 0208 06/24/16 1854 06/25/16 0428  WBC 8.1  --  7.0  HGB 7.5* 10.3* 9.1*  HCT 23.4* 32.4* 27.4*  PLT 218  --  216   BMET  Recent Labs  06/24/16 0208 06/25/16 0428  NA 141 136  K 4.5 4.7  CL 99* 99*  CO2 30 27  GLUCOSE 127* 84  BUN 38* 49*  CREATININE 6.61* 8.16*  CALCIUM 8.7* 8.8*   LFT  Recent Labs  06/24/16 0208 06/25/16 0428  PROT 7.1  --   ALBUMIN 3.1* 2.9*  AST 37  --   ALT 20  --   ALKPHOS 134*  --   BILITOT 0.6  --    PT/INR  Recent Labs  06/24/16 1854  LABPROT 14.9  INR 1.16     Studies/Results: Nm Gi Blood Loss  Result Date: 06/24/2016 CLINICAL DATA:  Gastrointestinal hemorrhage. EXAM: NUCLEAR MEDICINE GASTROINTESTINAL BLEEDING SCAN TECHNIQUE: Sequential abdominal images were obtained following intravenous administration of Tc-4m labeled red blood cells. RADIOPHARMACEUTICALS:  21.1 mCi Tc-80m in-vitro labeled red cells. COMPARISON:  Ultrasound dated 05/31/2016 FINDINGS:  Initial flow images normal. There is some vague and low-grade activity probably corresponding to proximal small bowel in the left upper quadrant on the initials power series. This appears to continue on the delayed series. The appearance suggests a possible upper abdominal source, such as stomach or small bowel, although the activity is 2 vague to make out much more detail. IMPRESSION: 1. Today's exam is positive although weakly so. The earliest  visible activity projected to be in bowel is in the left upper quadrant, raising the possibility of a gastric or small bowel source based on overall appearance. Electronically Signed   By: Van Clines M.D.   On: 06/24/2016 18:10    Impression/Plan: 56 yo with ESRD, CAD who has obscure overt GI bleeding likely due to a small bowel AVM. Doubt an upper source with an unrevealing EGD on 06/16/16. He may have small bowel AVMs that are distal to the reach of the enteroscope and likely would benefit from a double balloon enteroscopy at a tertiary care center. No urgency to transfer as an inpt unless his hemodynamics change with persistent bleeding. Would follow his H/Hs and continue supportive care. Full liquid diet when off of dialysis today and then advance to soft food is ok. Will follow.    LOS: 1 day   Maria Antonia C.  06/25/2016, 11:14 AM

## 2016-06-25 NOTE — Progress Notes (Signed)
HD COMPLETED  

## 2016-06-25 NOTE — Progress Notes (Signed)
[facility] [place] NAME: Stanley [name]    MR#:  [id number]  DATE OF BIRTH:  1960/07/17  SUBJECTIVE:  CHIEF COMPLAINT:   Chief Complaint  Patient presents with  . Rectal Bleeding     Recurrent admissions for GI bleed, multiple tiomes EGD and colonoscopy and capsule endoscopy done.   Came again with multiple bowel movements with fresh blood.   Received Blood transfusion, no pain.   Hb came up, No BM since yesterday morning.  REVIEW OF SYSTEMS:  CONSTITUTIONAL: No fever, fatigue or weakness.  EYES: No blurred or double vision.  EARS, NOSE, AND THROAT: No tinnitus or ear pain.  RESPIRATORY: No cough, shortness of breath, wheezing or hemoptysis.  CARDIOVASCULAR: No chest pain, orthopnea, edema.  GASTROINTESTINAL: No nausea, vomiting, diarrhea or abdominal pain.  GENITOURINARY: No dysuria, hematuria.  ENDOCRINE: No polyuria, nocturia,  HEMATOLOGY: No anemia, easy bruising or bleeding SKIN: No rash or lesion. MUSCULOSKELETAL: No joint pain or arthritis.   NEUROLOGIC: No tingling, numbness, weakness.  PSYCHIATRY: No anxiety or depression.   ROS  DRUG ALLERGIES:  No Known Allergies  VITALS:  Blood pressure 137/68, pulse 87, temperature 97.8 F (36.6 C), temperature source Oral, resp. rate 19, height 6\' 3"  (1.905 m), weight 88.9 kg (195 lb 15.8 oz), SpO2 97 %.  PHYSICAL EXAMINATION:  GENERAL:  56 y.o.-year-old patient lying in the bed with no acute distress.  EYES: Pupils equal, round, reactive to light and accommodation. No scleral icterus. Extraocular muscles intact.  HEENT: Head atraumatic, normocephalic. Oropharynx and nasopharynx clear.  NECK:  Supple, no jugular venous distention. No thyroid enlargement, no tenderness.  LUNGS: Normal breath sounds bilaterally, no wheezing, rales,rhonchi or crepitation. No use of accessory muscles of respiration.  CARDIOVASCULAR: S1, S2 normal. No murmurs, rubs, or gallops.  ABDOMEN: Soft,  nontender, nondistended. Bowel sounds present. No organomegaly or mass.  EXTREMITIES: No pedal edema, cyanosis, or clubbing.  NEUROLOGIC: Cranial nerves II through XII are intact. Muscle strength 5/5 in all extremities. Sensation intact. Gait not checked.  PSYCHIATRIC: The patient is alert and oriented x 3.  SKIN: No obvious rash, lesion, or ulcer.   Physical Exam LABORATORY PANEL:   CBC  Recent Labs Lab 06/25/16 0428  WBC 7.0  HGB 9.1*  HCT 27.4*  PLT 216   ------------------------------------------------------------------------------------------------------------------  Chemistries   Recent Labs Lab 06/24/16 0208 06/25/16 0428  NA 141 136  K 4.5 4.7  CL 99* 99*  CO2 30 27  GLUCOSE 127* 84  BUN 38* 49*  CREATININE 6.61* 8.16*  CALCIUM 8.7* 8.8*  AST 37  --   ALT 20  --   ALKPHOS 134*  --   BILITOT 0.6  --    ------------------------------------------------------------------------------------------------------------------  Cardiac Enzymes No results for input(s): TROPONINI in the last 168 hours. ------------------------------------------------------------------------------------------------------------------  RADIOLOGY:  Nm Gi Blood Loss  Result Date: 06/24/2016 CLINICAL DATA:  Gastrointestinal hemorrhage. EXAM: NUCLEAR MEDICINE GASTROINTESTINAL BLEEDING SCAN TECHNIQUE: Sequential abdominal images were obtained following intravenous administration of Tc-14m labeled red blood cells. RADIOPHARMACEUTICALS:  21.1 mCi Tc-59m in-vitro labeled red cells. COMPARISON:  Ultrasound dated 05/31/2016 FINDINGS: Initial flow images normal. There is some vague and low-grade activity probably corresponding to proximal small bowel in the left upper quadrant on the initials power series. This appears to continue on the delayed series. The appearance suggests a possible upper abdominal source, such as stomach or small bowel, although the activity is 2 vague to make out much more detail.  IMPRESSION:  1. Today's exam is positive although weakly so. The earliest visible activity projected to be in bowel is in the left upper quadrant, raising the possibility of a gastric or small bowel source based on overall appearance. Electronically Signed   By: Van Clines M.D.   On: 06/24/2016 18:10    ASSESSMENT AND PLAN:   Active Problems:   GI bleed   GI bleeding  * GI bleed- acute on ch anemia due to blood loss   Likely from Small intestine as Upper anf Lower GI scope did not show clear source.   After last colonoscopy 06/19/16- GI suggested- to do bleeding scan in further episodes.    Received PRBC     Gi Bleeding scan is done- suggested some small bleeding site.    GI consult appreciated- suggest to monitor for now and follow at tertiary care center for double balloon endoscopy.    * ESRD on HD  nephrology to help with HD.  * Hypertension   Cont home meds  * Liver cirrhosis   Plt normal.   INR normal.   All the records are reviewed and case discussed with Care Management/Social Workerr. Management plans discussed with the patient, family and they are in agreement.  CODE STATUS: Full  TOTAL TIME TAKING CARE OF THIS PATIENT: 35 minutes.    POSSIBLE D/C IN 2-3 DAYS, DEPENDING ON CLINICAL CONDITION.   Vaughan Basta M.D on 06/25/2016   Between 7am to 6pm - Pager - [phone]  After 6pm go to [email or web address] - password [email or web address] [facility]  Office  [phone]  CC: Primary care physician; Elyse Jarvis, MD  Note: This dictation was prepared with Dragon dictation along with smaller phrase technology. Any transcriptional errors that result from this process are unintentional.

## 2016-06-25 NOTE — Progress Notes (Signed)
POST DIALYSIS ASSESSMENT 

## 2016-06-25 NOTE — Progress Notes (Signed)
HD STARTED  

## 2016-06-26 LAB — CBC
HEMATOCRIT: 27.1 % — AB (ref 40.0–52.0)
Hemoglobin: 8.8 g/dL — ABNORMAL LOW (ref 13.0–18.0)
MCH: 27.8 pg (ref 26.0–34.0)
MCHC: 32.6 g/dL (ref 32.0–36.0)
MCV: 85.3 fL (ref 80.0–100.0)
PLATELETS: 223 10*3/uL (ref 150–440)
RBC: 3.18 MIL/uL — ABNORMAL LOW (ref 4.40–5.90)
RDW: 17 % — AB (ref 11.5–14.5)
WBC: 6.9 10*3/uL (ref 3.8–10.6)

## 2016-06-26 NOTE — Discharge Summary (Signed)
[facility] [place] NAME: Stanley [name]    MR#:  [id number]  DATE OF BIRTH:  November 09, 1960  DATE OF ADMISSION:  06/24/2016 ADMITTING PHYSICIAN: Saundra Shelling, MD  DATE OF DISCHARGE:06/26/2016  PRIMARY CARE PHYSICIAN: Elyse Jarvis, MD    ADMISSION DIAGNOSIS:  Rectal bleeding [K62.5] Iron deficiency anemia, unspecified iron deficiency anemia type [D50.9] Stage 5 chronic kidney disease on chronic dialysis ([employer]) [N18.6, Z99.2]  DISCHARGE DIAGNOSIS:  Active Problems:   GI bleed   GI bleeding   SECONDARY DIAGNOSIS:   Past Medical History:  Diagnosis Date  . Alcohol abuse   . Cirrhosis ([place])   . Coronary artery disease 2009  . Diabetic peripheral neuropathy ([place])   . Drug abuse   . End stage renal disease on dialysis [facility]) NEPHROLOGIST-   DR [facility]  IN [place]   HEMODIALYSIS --   TUES/  THURS/  SAT  . GERD (gastroesophageal reflux disease)   . Hyperlipidemia   . Hypertension   . PAD (peripheral artery disease) ([place])   . Renal insufficiency    Per pt, 32 oz fluid restriction per day  . S/P triple vessel bypass 06/09/2016   2009ish  . Suicidal ideation    & HOMICIDAL IDEATION --  06-16-2013   ADMITTED TO [facility] COURSE:   * GI bleed- acute on ch anemia due to blood loss   Likely from Small intestine as Upper anf Lower GI scope did not show clear source.   After last colonoscopy 06/19/16- GI suggested- to do bleeding scan in further episodes.    Received [facility]BC     Gi Bleeding scan is done- suggested some small bleeding site.    GI consult appreciated- suggest to monitor for now and follow at tertiary care center for double balloon endoscopy.     After > 24 hrs monitoring- Hb stable and no more BM, so he can safely be d/c home.   Follow in GI clinic.  * ESRD on HD  nephrology to help with HD.  * Hypertension   Cont home meds  * Liver cirrhosis   Plt normal.   INR normal.  DISCHARGE  CONDITIONS:   Stable.  CONSULTS OBTAINED:  Treatment Team:  Wilford Corner, MD  DRUG ALLERGIES:  No Known Allergies  DISCHARGE MEDICATIONS:   Current Discharge Medication List    CONTINUE these medications which have NOT CHANGED   Details  amLODipine (NORVASC) 5 MG tablet Take 5 mg by mouth daily.    atorvastatin (LIPITOR) 40 MG tablet Take 40 mg by mouth daily. Refills: 11    b complex-vitamin c-folic acid (NEPHRO-VITE) 0.8 MG TABS tablet Take 1 tablet by mouth daily.    budesonide-formoterol (SYMBICORT) 160-4.5 MCG/ACT inhaler Inhale 2 puffs into the lungs daily.    furosemide (LASIX) 80 MG tablet Take 80 mg by mouth daily. Refills: 11    gabapentin (NEURONTIN) 300 MG capsule Take 1 capsule (300 mg total) by mouth daily. Qty: 30 capsule, Refills: 0    hydrOXYzine (VISTARIL) 25 MG capsule Take 1 capsule (25 mg total) by mouth 3 (three) times daily as needed for itching. Qty: 21 capsule, Refills: 0    labetalol (NORMODYNE) 100 MG tablet Take 1 tablet (100 mg total) by mouth daily. Qty: 30 tablet, Refills: 0    Multiple Vitamins-Minerals-FA (DIALYVITE SUPREME D) 3 MG TABS Take 1 tablet by mouth daily. Refills: 11    mupirocin ointment (BACTROBAN) 2 % Place  1 application into the nose 2 (two) times daily. Qty: 22 g, Refills: 0    naproxen (NAPROSYN) 500 MG tablet Take 500 mg by mouth 2 (two) times daily as needed. for pain Refills: 0    nitroGLYCERIN (NITROSTAT) 0.4 MG SL tablet Place 1 tablet (0.4 mg total) under the tongue every 5 (five) minutes as needed. Qty: 25 tablet, Refills: 6    oxyCODONE-acetaminophen (ROXICET) 5-325 MG tablet Take 1-2 tablets by mouth every 4 (four) hours as needed for severe pain. Qty: 20 tablet, Refills: 0    pantoprazole (PROTONIX) 40 MG tablet Take 1 tablet (40 mg total) by mouth daily. Qty: 30 tablet, Refills: 0    sevelamer carbonate (RENVELA) 800 MG tablet Take 3 tablets (2,400 mg total) by mouth 3 (three) times daily with  meals. Qty: 270 tablet, Refills: 1         DISCHARGE INSTRUCTIONS:    Follow with GI clinic in 1 week.  If you experience worsening of your admission symptoms, develop shortness of breath, life threatening emergency, suicidal or homicidal thoughts you must seek medical attention immediately by calling 911 or calling your MD immediately  if symptoms less severe.  You Must read complete instructions/literature along with all the possible adverse reactions/side effects for all the Medicines you take and that have been prescribed to you. Take any new Medicines after you have completely understood and accept all the possible adverse reactions/side effects.   Please note  You were cared for by a hospitalist during your hospital stay. If you have any questions about your discharge medications or the care you received while you were in the hospital after you are discharged, you can call the unit and asked to speak with the hospitalist on call if the hospitalist that took care of you is not available. Once you are discharged, your primary care physician will handle any further medical issues. Please note that NO REFILLS for any discharge medications will be authorized once you are discharged, as it is imperative that you return to your primary care physician (or establish a relationship with a primary care physician if you do not have one) for your aftercare needs so that they can reassess your need for medications and monitor your lab values.    Today   CHIEF COMPLAINT:   Chief Complaint  Patient presents with  . Rectal Bleeding    HISTORY OF PRESENT ILLNESS:  Stanley [name]  is a 56 y.o. male with a known history of Cirrhosis of liver, coronary artery disease, end-stage renal disease on dialysis, substance abuse presented to the emergency room with rectal bleed. Patient had rectal bleed around midnight and he had couple of bloody bowel movement yesterday. He gets dialyzed on Tuesday Thursday  and Saturday. Patient was at our hospital from March 3 2/9 and he had colonoscopy which was incomplete study. Patient recently had endoscopy in February. Patient also had capsule endoscopy in the past. His hemoglobin today is 7.5 and 2 units of packed red blood cells transfusion was ordered by emergency room physician. Patient has abdominal distention secondary to ascites and also has swelling of the legs secondary to fluid retention. No history of any vomiting of blood or coughing of blood. Hospitalist service was consulted for further care of the patient. Has generalized weakness tiredness and fatigue. No fever or chills and cough.  VITAL SIGNS:  Blood pressure (!) 141/88, pulse 78, temperature 98.6 F (37 C), temperature source Oral, resp. rate 16, height 6\' 3"  (1.905  m), weight 88.9 kg (195 lb 15.8 oz), SpO2 98 %.  I/O:   Intake/Output Summary (Last 24 hours) at 06/26/16 1144 Last data filed at 06/26/16 0900  Gross per 24 hour  Intake              200 ml  Output             1326 ml  Net            -1126 ml    PHYSICAL EXAMINATION:   GENERAL:  56 y.o.-year-old patient lying in the bed with no acute distress.  EYES: Pupils equal, round, reactive to light and accommodation. No scleral icterus. Extraocular muscles intact.  HEENT: Head atraumatic, normocephalic. Oropharynx and nasopharynx clear.  NECK:  Supple, no jugular venous distention. No thyroid enlargement, no tenderness.  LUNGS: Normal breath sounds bilaterally, no wheezing, rales,rhonchi or crepitation. No use of accessory muscles of respiration.  CARDIOVASCULAR: S1, S2 normal. No murmurs, rubs, or gallops.  ABDOMEN: Soft, nontender, nondistended. Bowel sounds present. No organomegaly or mass.  EXTREMITIES: No pedal edema, cyanosis, or clubbing.  NEUROLOGIC: Cranial nerves II through XII are intact. Muscle strength 5/5 in all extremities. Sensation intact. Gait not checked.  PSYCHIATRIC: The patient is alert and oriented x 3.   SKIN: No obvious rash, lesion, or ulcer.   DATA REVIEW:   CBC  Recent Labs Lab 06/26/16 0416  WBC 6.9  HGB 8.8*  HCT 27.1*  PLT 223    Chemistries   Recent Labs Lab 06/24/16 0208 06/25/16 0428  NA 141 136  K 4.5 4.7  CL 99* 99*  CO2 30 27  GLUCOSE 127* 84  BUN 38* 49*  CREATININE 6.61* 8.16*  CALCIUM 8.7* 8.8*  AST 37  --   ALT 20  --   ALKPHOS 134*  --   BILITOT 0.6  --     Cardiac Enzymes No results for input(s): TROPONINI in the last 168 hours.  Microbiology Results  Results for orders placed or performed during the hospital encounter of 05/30/16  MRSA PCR Screening     Status: Abnormal   Collection Time: 05/31/16  6:45 AM  Result Value Ref Range Status   MRSA by PCR POSITIVE (A) NEGATIVE Final    Comment:        The GeneXpert MRSA Assay (FDA approved for NASAL specimens only), is one component of a comprehensive MRSA colonization surveillance program. It is not intended to diagnose MRSA infection nor to guide or monitor treatment for MRSA infections. RESULT CALLED TO, READ BACK BY AND VERIFIED WITH:  BRAD CHISMON AT 6295 05/31/16 SDR   Body fluid culture     Status: None   Collection Time: 05/31/16  3:20 PM  Result Value Ref Range Status   Specimen Description PERITONEAL  Final   Special Requests NONE  Final   Gram Stain   Final    FEW WBC PRESENT, PREDOMINANTLY MONONUCLEAR NO ORGANISMS SEEN    Culture   Final    NO GROWTH 3 DAYS Performed at [facility], [address], [place], [place] [address]    Report Status 06/04/2016 FINAL  Final    RADIOLOGY:  Nm Gi Blood Loss  Result Date: 06/24/2016 CLINICAL DATA:  Gastrointestinal hemorrhage. EXAM: NUCLEAR MEDICINE GASTROINTESTINAL BLEEDING SCAN TECHNIQUE: Sequential abdominal images were obtained following intravenous administration of Tc-40m labeled red blood cells. RADIOPHARMACEUTICALS:  21.1 mCi Tc-4m in-vitro labeled red cells. COMPARISON:  Ultrasound dated 05/31/2016 FINDINGS:  Initial flow images  normal. There is some vague and low-grade activity probably corresponding to proximal small bowel in the left upper quadrant on the initials power series. This appears to continue on the delayed series. The appearance suggests a possible upper abdominal source, such as stomach or small bowel, although the activity is 2 vague to make out much more detail. IMPRESSION: 1. Today's exam is positive although weakly so. The earliest visible activity projected to be in bowel is in the left upper quadrant, raising the possibility of a gastric or small bowel source based on overall appearance. Electronically Signed   By: Van Clines M.D.   On: 06/24/2016 18:10    EKG:   Orders placed or performed during the hospital encounter of 06/24/16  . EKG 12-Lead  . EKG 12-Lead      Management plans discussed with the patient, family and they are in agreement.  CODE STATUS:     Code Status Orders        Start     Ordered   06/24/16 0424  Full code  Continuous     06/24/16 0423    Code Status History    Date Active Date Inactive Code Status Order ID Comments User Context   06/14/2016  2:53 PM 06/19/2016  9:04 PM Full Code [id number]  Hillary Bow, MD ED   05/31/2016  6:42 AM 06/01/2016  9:30 PM Full Code [id number]  Saundra Shelling, MD Inpatient   05/05/2016 11:09 PM 05/10/2016  9:57 PM Full Code [id number]  Vianne Bulls, MD Inpatient   04/16/2016  3:56 AM 04/17/2016  6:27 PM Full Code [id number]  Saundra Shelling, MD Inpatient   01/19/2016  3:13 PM 01/22/2016  2:14 PM Full Code [id number]  Loletha Grayer, MD ED   09/24/2015 10:33 AM 09/25/2015  1:31 PM Full Code [id number]  Loletha Grayer, MD ED   05/16/2015  2:17 PM 05/17/2015  8:57 PM Full Code [id number]  Idelle Crouch, MD Inpatient   04/30/2015  8:08 PM 05/08/2015  7:40 PM Full Code [id number]  Vaughan Basta, MD Inpatient   04/06/2015  5:07 AM 04/07/2015  5:36 PM Full Code [id number]  Lance Coon, MD Inpatient   03/23/2015 11:07 AM 03/27/2015  12:46 PM Full Code [id number]  Bettey Costa, MD Inpatient   06/19/2013 11:32 AM 06/21/2013  5:49 PM Full Code [id number]  Cristal Ford, DO Inpatient   06/16/2013  5:26 PM 06/19/2013 11:32 AM Full Code [id number]  Margarita Mail, PA-C ED   06/05/2013  3:33 PM 06/06/2013  6:56 PM Full Code [id number]  [place], DO Inpatient   06/02/2013  9:01 AM 06/05/2013  3:33 PM Full Code [id number]  Velvet Bathe, MD Inpatient   06/02/2013  8:40 AM 06/02/2013  9:01 AM Full Code [id number]  Velvet Bathe, MD Inpatient      TOTAL TIME TAKING CARE OF THIS PATIENT: 35 minutes.    Vaughan Basta M.D on 06/26/2016 at 11:44 AM  Between 7am to 6pm - Pager - [phone]  After 6pm go to [email or web address] - password [email or web address] [facility]  Office  [phone]  CC: Primary care physician; Elyse Jarvis, MD   Note: This dictation was prepared with Dragon dictation along with smaller phrase technology. Any transcriptional errors that result from this process are unintentional.

## 2016-06-26 NOTE — Progress Notes (Signed)
Gastroenterology Progress Note    Stanley [name] 56 y.o. 1960/07/09   Subjective: Feels ok. No BMs or bleeding overnight. Denies abdominal pain.  Objective: Vital signs in last 24 hours: Vitals:   06/25/16 2150 06/26/16 0509  BP: 127/64 (!) 141/88  Pulse: 85 78  Resp: 20 16  Temp: 98.5 F (36.9 C) 98.6 F (37 C)    Physical Exam: Gen: lethargic, no acute distress, well-nourished CV: RRR Chest: CTA B Abd: soft, nontender, nondistended, +BS Ext: no edema  Lab Results:  Recent Labs  06/24/16 0208 06/25/16 0428  NA 141 136  K 4.5 4.7  CL 99* 99*  CO2 30 27  GLUCOSE 127* 84  BUN 38* 49*  CREATININE 6.61* 8.16*  CALCIUM 8.7* 8.8*  PHOS  --  3.9    Recent Labs  06/24/16 0208 06/25/16 0428  AST 37  --   ALT 20  --   ALKPHOS 134*  --   BILITOT 0.6  --   PROT 7.1  --   ALBUMIN 3.1* 2.9*    Recent Labs  06/25/16 0428 06/26/16 0416  WBC 7.0 6.9  HGB 9.1* 8.8*  HCT 27.4* 27.1*  MCV 86.3 85.3  PLT 216 223    Recent Labs  06/24/16 1854  LABPROT 14.9  INR 1.16      Assessment/Plan: Obscure GI bleed - no further bleeding. Hgb stable. Suspect small bowel AVMs and may need an outpt double balloon enteroscopy. Ok to go home from GI standpoint but needs f/u with Dr. Vicente Males in 1-2 weeks who can decide on timing of DBE at [facility] or other tertiary facility. D/W Dr. Anselm Jungling. May need to have iron infusions arranged. Will sign off. Call if questions.   [place] C. 06/26/2016, 11:24 AM

## 2016-06-26 NOTE — Progress Notes (Signed)
Pt to be discharged per Md order. IV removed. Instructions reviewed with pt who verbalizes understanding. No new medications. Will discharge in wheelchair when ride is available.

## 2016-06-26 NOTE — Progress Notes (Signed)
[facility]  ROUNDING NOTE   Subjective:   Hemodialysis yesterday. Tolerated treatment well. UF of 1326.   Objective:  Vital signs in last 24 hours:  Temp:  [97.8 F (36.6 C)-98.6 F (37 C)] 98.6 F (37 C) (04/15 0509) Pulse Rate:  [73-87] 78 (04/15 0509) Resp:  [12-22] 16 (04/15 0509) BP: (107-141)/(64-96) 141/88 (04/15 0509) SpO2:  [96 %-98 %] 98 % (04/15 0509)  Weight change:  Filed Weights   06/24/16 0158 06/24/16 0422 06/25/16 1045  Weight: 83.9 kg (185 lb) 85.9 kg (189 lb 4.8 oz) 88.9 kg (195 lb 15.8 oz)    Intake/Output: I/O last 3 completed shifts: In: 280 [P.O.:280] Out: 1526 [Urine:200; Other:1326]   Intake/Output this shift:  Total I/O In: 200 [P.O.:200] Out: -   Physical Exam: General: No acute distress  Head: Normocephalic, atraumatic. Moist oral mucosal membranes  Eyes: Anicteric  Neck: Supple, trachea midline  Lungs:  Clear to auscultation, normal effort  Heart: S1S2 no rubs  Abdomen:  Soft, nontender, mild distension  Extremities: No peripheral edema.  Neurologic: Nonfocal, moving all four extremities  Skin: No lesions  Access: RUE AVF    Basic Metabolic Panel:  Recent Labs Lab 06/24/16 0208 06/25/16 0428  NA 141 136  K 4.5 4.7  CL 99* 99*  CO2 30 27  GLUCOSE 127* 84  BUN 38* 49*  CREATININE 6.61* 8.16*  CALCIUM 8.7* 8.8*  PHOS  --  3.9    Liver Function Tests:  Recent Labs Lab 06/24/16 0208 06/25/16 0428  AST 37  --   ALT 20  --   ALKPHOS 134*  --   BILITOT 0.6  --   PROT 7.1  --   ALBUMIN 3.1* 2.9*   No results for input(s): LIPASE, AMYLASE in the last 168 hours. No results for input(s): AMMONIA in the last 168 hours.  CBC:  Recent Labs Lab 06/24/16 0208 06/24/16 1854 06/25/16 0428 06/26/16 0416  WBC 8.1  --  7.0 6.9  HGB 7.5* 10.3* 9.1* 8.8*  HCT 23.4* 32.4* 27.4* 27.1*  MCV 86.1  --  86.3 85.3  PLT 218  --  216 223    Cardiac Enzymes: No results for input(s): CKTOTAL, CKMB, CKMBINDEX,  TROPONINI in the last 168 hours.  BNP: Invalid input(s): POCBNP  CBG: No results for input(s): GLUCAP in the last 168 hours.  Microbiology: Results for orders placed or performed during the hospital encounter of 05/30/16  MRSA PCR Screening     Status: Abnormal   Collection Time: 05/31/16  6:45 AM  Result Value Ref Range Status   MRSA by PCR POSITIVE (A) NEGATIVE Final    Comment:        The GeneXpert MRSA Assay (FDA approved for NASAL specimens only), is one component of a comprehensive MRSA colonization surveillance program. It is not intended to diagnose MRSA infection nor to guide or monitor treatment for MRSA infections. RESULT CALLED TO, READ BACK BY AND VERIFIED WITH:  BRAD CHISMON AT 6378 05/31/16 SDR   Body fluid culture     Status: None   Collection Time: 05/31/16  3:20 PM  Result Value Ref Range Status   Specimen Description PERITONEAL  Final   Special Requests NONE  Final   Gram Stain   Final    FEW WBC PRESENT, PREDOMINANTLY MONONUCLEAR NO ORGANISMS SEEN    Culture   Final    NO GROWTH 3 DAYS Performed at [facility], [address], [place], [place]  [address]    Report Status 06/04/2016 FINAL  Final    Coagulation Studies:  Recent Labs  06/24/16 1854  LABPROT 14.9  INR 1.16    Urinalysis: No results for input(s): COLORURINE, LABSPEC, PHURINE, GLUCOSEU, HGBUR, BILIRUBINUR, KETONESUR, PROTEINUR, UROBILINOGEN, NITRITE, LEUKOCYTESUR in the last 72 hours.  Invalid input(s): APPERANCEUR    Imaging: Nm Gi Blood Loss  Result Date: 06/24/2016 CLINICAL DATA:  Gastrointestinal hemorrhage. EXAM: NUCLEAR MEDICINE GASTROINTESTINAL BLEEDING SCAN TECHNIQUE: Sequential abdominal images were obtained following intravenous administration of Tc-65m labeled red blood cells. RADIOPHARMACEUTICALS:  21.1 mCi Tc-72m in-vitro labeled red cells. COMPARISON:  Ultrasound dated 05/31/2016 FINDINGS: Initial flow images normal. There is some vague and low-grade  activity probably corresponding to proximal small bowel in the left upper quadrant on the initials power series. This appears to continue on the delayed series. The appearance suggests a possible upper abdominal source, such as stomach or small bowel, although the activity is 2 vague to make out much more detail. IMPRESSION: 1. Today's exam is positive although weakly so. The earliest visible activity projected to be in bowel is in the left upper quadrant, raising the possibility of a gastric or small bowel source based on overall appearance. Electronically Signed   By: Van Clines M.D.   On: 06/24/2016 18:10     Medications:    . gabapentin  300 mg Oral QHS  . mometasone-formoterol  2 puff Inhalation BID  . nicotine  21 mg Transdermal Daily  . pantoprazole (PROTONIX) IV  40 mg Intravenous Q12H  . sevelamer carbonate  2,400 mg Oral TID WC  . sodium chloride flush  3 mL Intravenous Q12H   ALPRAZolam, morphine injection, ondansetron **OR** ondansetron (ZOFRAN) IV  Assessment/ Plan:  56 y.o. male with ESRD on hemodialysis, hypertension, peripheral vascular disease, coronary artery disease, cirrhosis, substance abuse, recurrent GI bleed  TTS [facility] [place]. Right arm AVF   1. End stage renal disease: TTS. Tolerated hemodialysis yesterday. Treatment for Tuesday.   2. Hypertension: blood pressure at goal. Currently holding medications.   3. Anemia of chronic kidney disease with GI blood loss: hemoglobin 8.8 status post 2 units PRBC 4/13 - [facility] as outpatient - Appreciate GI input. May need transfer to tertiary medical center.   4. Secondary Hyperparathyroidism:  - sevelamer for binding   LOS: [place], [place] 4/15/201811:46 AM

## 2016-06-28 ENCOUNTER — Other Ambulatory Visit: Payer: Self-pay

## 2016-06-28 ENCOUNTER — Ambulatory Visit (INDEPENDENT_AMBULATORY_CARE_PROVIDER_SITE_OTHER): Payer: Medicare Other | Admitting: Cardiology

## 2016-06-28 ENCOUNTER — Encounter: Payer: Self-pay | Admitting: Cardiology

## 2016-06-28 ENCOUNTER — Ambulatory Visit (INDEPENDENT_AMBULATORY_CARE_PROVIDER_SITE_OTHER): Payer: Medicare Other

## 2016-06-28 ENCOUNTER — Telehealth: Payer: Self-pay

## 2016-06-28 VITALS — BP 130/70 | HR 87 | Ht 75.0 in | Wt 199.5 lb

## 2016-06-28 DIAGNOSIS — I255 Ischemic cardiomyopathy: Secondary | ICD-10-CM

## 2016-06-28 DIAGNOSIS — R079 Chest pain, unspecified: Secondary | ICD-10-CM | POA: Diagnosis not present

## 2016-06-28 DIAGNOSIS — I10 Essential (primary) hypertension: Secondary | ICD-10-CM

## 2016-06-28 DIAGNOSIS — I2510 Atherosclerotic heart disease of native coronary artery without angina pectoris: Secondary | ICD-10-CM

## 2016-06-28 NOTE — Telephone Encounter (Signed)
Unable to contact patient.   Advised repeat colonoscopy needed within 2 months, per Dr. Vicente Males due to poor prep.

## 2016-06-28 NOTE — Patient Instructions (Signed)
Follow-Up: Your physician wants you to follow-up in: 3 months. You will receive a reminder letter in the mail two months in advance. If you don't receive a letter, please call our office to schedule the follow-up appointment.  It was a pleasure seeing you today here in the office. Please do not hesitate to give [name] a call back if you have any further questions. [place], BSN

## 2016-06-28 NOTE — Progress Notes (Signed)
Cardiology Office Note   Date:  06/28/2016   ID:  Stanley [name], DOB 07/31/1960, MRN [id number]  Referring Doctor:  Elyse Jarvis, MD   Cardiologist:   Wende Bushy, MD   Reason for consultation:  Chief Complaint  Patient presents with  . other    F/u tests. Meds reviewed verbally with pt.      History of Present Illness: Stanley [name] is a 56 y.o. male who presents for Follow-up after testing  Patient presented to the ER 03/20/2016 for chest pain. The patient was evaluated by Dr. Lisa Roca. Troponin was 0.13. Call was made to on call cardiologist Dr. Saralyn Pilar. Per medical records, he did recommend that if repeat troponin is unchanged, he would be okay to discharge the patient home for follow-up as an outpatient. Repeat troponin was 0.13.  Pt is referred to our practice.  He is known to have coronary artery disease status post CABG x 3 in 2009. He was supposed to be following up with Dr. Clayborn Bigness but has not done so in many years. The last time was probably in 2010. He does not know why he is not on cholesterol medication. He continues to smoke maybe 4 cigarettes a day.  Since last visit, patient has not had any chest pain. Shortness of breath is at baseline. He missed dialysis today. He was in the hospital recently for GI bleed and will likely see a GI specialist at [facility].  ROS:  Please see the history of present illness. Aside from mentioned under HPI, all other systems are reviewed and negative.    Past Medical History:  Diagnosis Date  . Alcohol abuse   . Cirrhosis ([place])   . Coronary artery disease 2009  . Diabetic peripheral neuropathy ([place])   . Drug abuse   . End stage renal disease on dialysis [facility]) NEPHROLOGIST-   DR [facility]  IN [place]   HEMODIALYSIS --   TUES/  THURS/  SAT  . GERD (gastroesophageal reflux disease)   . Hyperlipidemia   . Hypertension   . PAD (peripheral artery disease) ([place])   . Renal insufficiency    Per pt, 32 oz fluid  restriction per day  . S/P triple vessel bypass 06/09/2016   2009ish  . Suicidal ideation    & HOMICIDAL IDEATION --  06-16-2013   ADMITTED TO BEHAVIOR HEALTH    Past Surgical History:  Procedure Laterality Date  . AGILE CAPSULE N/A 06/19/2016   Procedure: AGILE CAPSULE;  Surgeon: [name], MD;  Location: [facility] ENDOSCOPY;  Service: Endoscopy;  Laterality: N/A;  . COLONOSCOPY WITH PROPOFOL N/A 06/18/2016   Procedure: COLONOSCOPY WITH PROPOFOL;  Surgeon: [name], MD;  Location: [facility] ENDOSCOPY;  Service: Endoscopy;  Laterality: N/A;  . CORONARY ANGIOPLASTY  ?   PT UNABLE TO TELL IF  BEFORE OR AFTER  CABG  . CORONARY ARTERY BYPASS GRAFT  2008  ([place] , )   3 VESSEL  . DIALYSIS FISTULA CREATION  LAST SURGERY  APPOX  2008  . ENTEROSCOPY N/A 05/10/2016   Procedure: ENTEROSCOPY;  Surgeon: Jerene Bears, MD;  Location: [place];  Service: Gastroenterology;  Laterality: N/A;  . ESOPHAGOGASTRODUODENOSCOPY N/A 05/07/2015   Procedure: ESOPHAGOGASTRODUODENOSCOPY (EGD);  Surgeon: Hulen Luster, MD;  Location: [facility] ENDOSCOPY;  Service: Endoscopy;  Laterality: N/A;  . ESOPHAGOGASTRODUODENOSCOPY (EGD) WITH PROPOFOL N/A 05/17/2015   Procedure: ESOPHAGOGASTRODUODENOSCOPY (EGD) WITH PROPOFOL;  Surgeon: [name], MD;  Location: [facility] ENDOSCOPY;  Service: Endoscopy;  Laterality: N/A;  .  ESOPHAGOGASTRODUODENOSCOPY (EGD) WITH PROPOFOL N/A 01/20/2016   Procedure: ESOPHAGOGASTRODUODENOSCOPY (EGD) WITH PROPOFOL;  Surgeon: [name], MD;  Location: [facility] ENDOSCOPY;  Service: Endoscopy;  Laterality: N/A;  . ESOPHAGOGASTRODUODENOSCOPY (EGD) WITH PROPOFOL N/A 04/17/2016   Procedure: ESOPHAGOGASTRODUODENOSCOPY (EGD) WITH PROPOFOL;  Surgeon: Lin Landsman, MD;  Location: [facility] ENDOSCOPY;  Service: Gastroenterology;  Laterality: N/A;  . ESOPHAGOGASTRODUODENOSCOPY (EGD) WITH PROPOFOL  05/09/2016   Procedure: ESOPHAGOGASTRODUODENOSCOPY (EGD) WITH PROPOFOL;  Surgeon: Jerene Bears, MD;  Location: [place];  Service:  Endoscopy;;  . ESOPHAGOGASTRODUODENOSCOPY (EGD) WITH PROPOFOL N/A 06/16/2016   Procedure: ESOPHAGOGASTRODUODENOSCOPY (EGD) WITH PROPOFOL;  Surgeon: [name], MD;  Location: [facility] ENDOSCOPY;  Service: Endoscopy;  Laterality: N/A;  . GIVENS CAPSULE STUDY N/A 05/07/2016   Procedure: GIVENS CAPSULE STUDY;  Surgeon: Doran Stabler, MD;  Location: [place];  Service: Endoscopy;  Laterality: N/A;  . MANDIBULAR HARDWARE REMOVAL N/A 07/29/2013   Procedure: REMOVAL OF ARCH BARS;  Surgeon: Theodoro Kos, DO;  Location: [place];  Service: Plastics;  Laterality: N/A;  . ORIF MANDIBULAR FRACTURE N/A 06/05/2013   Procedure: REPAIR OF MANDIBULAR FRACTURE x 2 with maxillo-mandibular fixation ;  Surgeon: Theodoro Kos, DO;  Location: [place];  Service: Plastics;  Laterality: N/A;  . PERIPHERAL ARTERIAL STENT GRAFT Left      reports that he has been smoking Cigarettes.  He has a 6.00 pack-year smoking history. He has never used smokeless tobacco. He reports that he uses drugs, including Marijuana and Cocaine, about 7 times per week. He reports that he does not drink alcohol.   family history includes Cancer in his father and sister; Colon cancer in his mother.   Outpatient Medications Prior to Visit  Medication Sig Dispense Refill  . amLODipine (NORVASC) 5 MG tablet Take 5 mg by mouth daily.    Marland Kitchen atorvastatin (LIPITOR) 40 MG tablet Take 40 mg by mouth daily.  11  . b complex-vitamin c-folic acid (NEPHRO-VITE) 0.8 MG TABS tablet Take 1 tablet by mouth daily.    . budesonide-formoterol (SYMBICORT) 160-4.5 MCG/ACT inhaler Inhale 2 puffs into the lungs daily.    . furosemide (LASIX) 80 MG tablet Take 80 mg by mouth daily.  11  . gabapentin (NEURONTIN) 300 MG capsule Take 1 capsule (300 mg total) by mouth daily. (Patient taking differently: Take 300 mg by mouth at bedtime. ) 30 capsule 0  . hydrOXYzine (VISTARIL) 25 MG capsule Take 1 capsule (25 mg total) by mouth 3 (three) times daily as needed  for itching. 21 capsule 0  . labetalol (NORMODYNE) 100 MG tablet Take 1 tablet (100 mg total) by mouth daily. 30 tablet 0  . Multiple Vitamins-Minerals-FA (DIALYVITE SUPREME D) 3 MG TABS Take 1 tablet by mouth daily.  11  . mupirocin ointment (BACTROBAN) 2 % Place 1 application into the nose 2 (two) times daily. 22 g 0  . naproxen (NAPROSYN) 500 MG tablet Take 500 mg by mouth 2 (two) times daily as needed. for pain  0  . nitroGLYCERIN (NITROSTAT) 0.4 MG SL tablet Place 1 tablet (0.4 mg total) under the tongue every 5 (five) minutes as needed. (Patient taking differently: Place 0.4 mg under the tongue every 5 (five) minutes as needed for chest pain. ) 25 tablet 6  . oxyCODONE-acetaminophen (ROXICET) 5-325 MG tablet Take 1-2 tablets by mouth every 4 (four) hours as needed for severe pain. 20 tablet 0  . pantoprazole (PROTONIX) 40 MG tablet Take 1 tablet (40 mg total) by mouth daily. [place]  tablet 0  . sevelamer carbonate (RENVELA) 800 MG tablet Take 3 tablets (2,400 mg total) by mouth 3 (three) times daily with meals. 270 tablet 1   No facility-administered medications prior to visit.      Allergies: Patient has no known allergies.    PHYSICAL EXAM: VS:  BP 130/70 (BP Location: Left Arm, Patient Position: Sitting, Cuff Size: Normal)   Pulse 87   Ht 6\' 3"  (1.905 m)   Wt 199 lb 8 oz (90.5 kg)   BMI 24.94 kg/m  , Body mass index is 24.94 kg/m. Wt Readings from Last 3 Encounters:  06/28/16 199 lb 8 oz (90.5 kg)  06/25/16 195 lb 15.8 oz (88.9 kg)  06/19/16 195 lb 11.2 oz (88.8 kg)   GENERAL:  well developed, well nourished, not in acute distress HEENT: normocephalic, pink conjunctivae, anicteric sclerae, no xanthelasma, normal dentition, oropharynx clear NECK:  no neck vein engorgement, JVP normal, no hepatojugular reflux, carotid upstroke brisk and symmetric, no bruit, no thyromegaly, no lymphadenopathy LUNGS:  good respiratory effort, clear to auscultation bilaterally, occasional wheezing CV:   PMI not displaced, no thrills, no lifts, S1 and S2 within normal limits, no palpable S3 or S4, no murmurs, no rubs, no gallops ABD:  Soft, nontender, nondistended, normoactive bowel sounds, no abdominal aortic bruit, no hepatomegaly, no splenomegaly MS: nontender back, no kyphosis, no scoliosis, no joint deformities EXT:  2+ DP/PT pulses, no edema, no varicosities, no cyanosis, no clubbing SKIN: warm, nondiaphoretic, normal turgor, no ulcers NEUROPSYCH: alert, oriented to person, place, and time, sensory/motor grossly intact, normal mood, appropriate affect    Recent Labs: 05/05/2016: Magnesium 2.2 05/30/2016: B Natriuretic Peptide 4,018.0 06/24/2016: ALT 20 06/25/2016: BUN 49; Creatinine, Ser 8.16; Potassium 4.7; Sodium 136 06/26/2016: Hemoglobin 8.8; Platelets 223   Lipid Panel No results found for: CHOL, TRIG, HDL, CHOLHDL, VLDL, LDLCALC, LDLDIRECT   Other studies Reviewed:  EKG:  The ekg from 04/12/2016 was personally reviewed by me and it revealed sinus rhythm, 85 BPM. LVH with repolarization abnormality.  Additional studies/ records that were reviewed personally reviewed by me today include:  Echo 10/26/2015,  [facility] health care: Technically difficult study due to chest wall/lung interference   Mildly decreased left ventricular systolic function, ejection fraction   45 to 50%   Segmental wall motion abnormality - inferior thinning and akinesis   Dilated left ventricle - mild   Diastolic dysfunction - grade III (severely elevated filling pressures)   Dilated left atrium - moderate to severe   Mitral regurgitation - severe   Degenerative mitral valve disease   Aortic sclerosis   Elevated pulmonary artery systolic pressure - moderate   Mildly decreased right ventricular systolic function   Dilated right ventricle - mild   Dilated right atrium - moderate   Elevated right atrial pressure   Nuclear stress test 08/05/2011, outside hospital: Evidence of  significant known CAD. Probable scar artifact less likely, but no clear evidence of ischemia. Medical treatment recommended. EF 44% Inferior apical scar  Nuclear stress test 06/09/2016:  T wave inversion was noted during stress in the II, III and aVF leads.  There was no ST segment deviation noted during stress.  Defect 1: There is a medium defect of severe severity present in the basal inferior and mid inferior location.  Findings consistent with prior inferior myocardial infarction without significant ischemia.  This is an intermediate risk study.  Nuclear stress EF: 40%.     ASSESSMENT AND PLAN:  CAD s/p CABG x 3  No ischemia but prior inferior MI on nuclear stress test. Awaiting echocardiogram from today. Continue medical therapy for now. Aspirin, beta blocker, statin therapy.  Cardiomyopathy, EF of 45-50%, from outside echo August 2017, likely ischemic cardiomyopathy Wheezing noted on auscultation. Patient missed his dialysis today. Patient advised compliance with dialysis schedule. Awaiting echo.  Hypertension BP is well controlled. Continue monitoring BP. Continue current medical therapy and lifestyle changes.  Current medicines are reviewed at length with the patient today.  The patient does not have concerns regarding medicines.  Labs/ tests ordered today include:  Orders Placed This Encounter  Procedures  . EKG 12-Lead    I had a lengthy and detailed discussion with the patient regarding diagnoses, prognosis, diagnostic options, treatment options , and side effects of medications.   I counseled the patient on importance of lifestyle modification including heart healthy diet, regular physical activity , and smoking cessation.   Disposition:   FU with cardiology in 3 months  I spent at least 25 minutes with the patient today and more than 50% of the time was spent counseling the patient and coordinating care.        Signed, Wende Bushy, MD  06/28/2016  1:54 PM    [place]  This note was generated in part with voice recognition software and I apologize for any typographical errors that were not detected and corrected.

## 2016-06-28 NOTE — Telephone Encounter (Signed)
-----   Message from [name], MD sent at 06/27/2016  9:42 AM EDT ----- Tubular adenoma- poor prep colonoscopy needs repeat in 1-2 months

## 2016-06-30 DIAGNOSIS — N186 End stage renal disease: Secondary | ICD-10-CM | POA: Diagnosis not present

## 2016-06-30 DIAGNOSIS — Z23 Encounter for immunization: Secondary | ICD-10-CM | POA: Diagnosis not present

## 2016-07-02 DIAGNOSIS — Z23 Encounter for immunization: Secondary | ICD-10-CM | POA: Diagnosis not present

## 2016-07-02 DIAGNOSIS — N186 End stage renal disease: Secondary | ICD-10-CM | POA: Diagnosis not present

## 2016-07-03 ENCOUNTER — Encounter: Payer: Self-pay | Admitting: Emergency Medicine

## 2016-07-03 ENCOUNTER — Inpatient Hospital Stay
Admission: EM | Admit: 2016-07-03 | Discharge: 2016-07-05 | DRG: 190 | Discharge status: Against Medical Advice | Payer: Medicare Other | Attending: Internal Medicine | Admitting: Internal Medicine

## 2016-07-03 ENCOUNTER — Emergency Department: Payer: Medicare Other

## 2016-07-03 DIAGNOSIS — R0602 Shortness of breath: Secondary | ICD-10-CM | POA: Diagnosis not present

## 2016-07-03 DIAGNOSIS — I10 Essential (primary) hypertension: Secondary | ICD-10-CM | POA: Diagnosis not present

## 2016-07-03 DIAGNOSIS — E785 Hyperlipidemia, unspecified: Secondary | ICD-10-CM | POA: Diagnosis present

## 2016-07-03 DIAGNOSIS — Z9861 Coronary angioplasty status: Secondary | ICD-10-CM

## 2016-07-03 DIAGNOSIS — Z951 Presence of aortocoronary bypass graft: Secondary | ICD-10-CM

## 2016-07-03 DIAGNOSIS — Z800 Family history of malignant neoplasm of digestive organs: Secondary | ICD-10-CM

## 2016-07-03 DIAGNOSIS — Z716 Tobacco abuse counseling: Secondary | ICD-10-CM | POA: Diagnosis not present

## 2016-07-03 DIAGNOSIS — D631 Anemia in chronic kidney disease: Secondary | ICD-10-CM | POA: Diagnosis present

## 2016-07-03 DIAGNOSIS — J440 Chronic obstructive pulmonary disease with acute lower respiratory infection: Secondary | ICD-10-CM | POA: Diagnosis present

## 2016-07-03 DIAGNOSIS — E1151 Type 2 diabetes mellitus with diabetic peripheral angiopathy without gangrene: Secondary | ICD-10-CM | POA: Diagnosis present

## 2016-07-03 DIAGNOSIS — I1311 Hypertensive heart and chronic kidney disease without heart failure, with stage 5 chronic kidney disease, or end stage renal disease: Secondary | ICD-10-CM | POA: Diagnosis present

## 2016-07-03 DIAGNOSIS — J441 Chronic obstructive pulmonary disease with (acute) exacerbation: Secondary | ICD-10-CM | POA: Diagnosis present

## 2016-07-03 DIAGNOSIS — I248 Other forms of acute ischemic heart disease: Secondary | ICD-10-CM | POA: Diagnosis present

## 2016-07-03 DIAGNOSIS — R188 Other ascites: Secondary | ICD-10-CM | POA: Diagnosis not present

## 2016-07-03 DIAGNOSIS — I2510 Atherosclerotic heart disease of native coronary artery without angina pectoris: Secondary | ICD-10-CM | POA: Diagnosis present

## 2016-07-03 DIAGNOSIS — J9601 Acute respiratory failure with hypoxia: Secondary | ICD-10-CM | POA: Diagnosis present

## 2016-07-03 DIAGNOSIS — Z7951 Long term (current) use of inhaled steroids: Secondary | ICD-10-CM

## 2016-07-03 DIAGNOSIS — Z5321 Procedure and treatment not carried out due to patient leaving prior to being seen by health care provider: Secondary | ICD-10-CM | POA: Diagnosis not present

## 2016-07-03 DIAGNOSIS — R0600 Dyspnea, unspecified: Secondary | ICD-10-CM

## 2016-07-03 DIAGNOSIS — N186 End stage renal disease: Secondary | ICD-10-CM | POA: Diagnosis present

## 2016-07-03 DIAGNOSIS — R140 Abdominal distension (gaseous): Secondary | ICD-10-CM | POA: Diagnosis not present

## 2016-07-03 DIAGNOSIS — K7031 Alcoholic cirrhosis of liver with ascites: Secondary | ICD-10-CM | POA: Diagnosis present

## 2016-07-03 DIAGNOSIS — E1142 Type 2 diabetes mellitus with diabetic polyneuropathy: Secondary | ICD-10-CM | POA: Diagnosis present

## 2016-07-03 DIAGNOSIS — E1122 Type 2 diabetes mellitus with diabetic chronic kidney disease: Secondary | ICD-10-CM | POA: Diagnosis present

## 2016-07-03 DIAGNOSIS — N2581 Secondary hyperparathyroidism of renal origin: Secondary | ICD-10-CM | POA: Diagnosis present

## 2016-07-03 DIAGNOSIS — F17210 Nicotine dependence, cigarettes, uncomplicated: Secondary | ICD-10-CM | POA: Diagnosis present

## 2016-07-03 DIAGNOSIS — Z992 Dependence on renal dialysis: Secondary | ICD-10-CM

## 2016-07-03 DIAGNOSIS — R0902 Hypoxemia: Secondary | ICD-10-CM | POA: Diagnosis not present

## 2016-07-03 DIAGNOSIS — K219 Gastro-esophageal reflux disease without esophagitis: Secondary | ICD-10-CM | POA: Diagnosis present

## 2016-07-03 DIAGNOSIS — R748 Abnormal levels of other serum enzymes: Secondary | ICD-10-CM | POA: Diagnosis not present

## 2016-07-03 DIAGNOSIS — J209 Acute bronchitis, unspecified: Secondary | ICD-10-CM | POA: Diagnosis present

## 2016-07-03 DIAGNOSIS — I129 Hypertensive chronic kidney disease with stage 1 through stage 4 chronic kidney disease, or unspecified chronic kidney disease: Secondary | ICD-10-CM | POA: Diagnosis not present

## 2016-07-03 LAB — CBC WITH DIFFERENTIAL/PLATELET
Basophils Absolute: 0.1 10*3/uL (ref 0–0.1)
Basophils Relative: 1 %
EOS ABS: 0 10*3/uL (ref 0–0.7)
EOS PCT: 0 %
HCT: 27.9 % — ABNORMAL LOW (ref 40.0–52.0)
Hemoglobin: 8.9 g/dL — ABNORMAL LOW (ref 13.0–18.0)
LYMPHS ABS: 0.8 10*3/uL — AB (ref 1.0–3.6)
LYMPHS PCT: 6 %
MCH: 27.9 pg (ref 26.0–34.0)
MCHC: 31.7 g/dL — ABNORMAL LOW (ref 32.0–36.0)
MCV: 87.9 fL (ref 80.0–100.0)
MONO ABS: 1.6 10*3/uL — AB (ref 0.2–1.0)
Monocytes Relative: 13 %
Neutro Abs: 9.6 10*3/uL — ABNORMAL HIGH (ref 1.4–6.5)
Neutrophils Relative %: 80 %
PLATELETS: 264 10*3/uL (ref 150–440)
RBC: 3.18 MIL/uL — ABNORMAL LOW (ref 4.40–5.90)
RDW: 17.5 % — ABNORMAL HIGH (ref 11.5–14.5)
WBC: 12.1 10*3/uL — ABNORMAL HIGH (ref 3.8–10.6)

## 2016-07-03 LAB — BASIC METABOLIC PANEL
Anion gap: 13 (ref 5–15)
BUN: 53 mg/dL — AB (ref 6–20)
CALCIUM: 8.9 mg/dL (ref 8.9–10.3)
CO2: 27 mmol/L (ref 22–32)
Chloride: 100 mmol/L — ABNORMAL LOW (ref 101–111)
Creatinine, Ser: 8.76 mg/dL — ABNORMAL HIGH (ref 0.61–1.24)
GFR calc Af Amer: 7 mL/min — ABNORMAL LOW (ref >60)
GFR, EST NON AFRICAN AMERICAN: 6 mL/min — AB (ref >60)
GLUCOSE: 88 mg/dL (ref 65–99)
Potassium: 4.8 mmol/L (ref 3.5–5.1)
SODIUM: 140 mmol/L (ref 135–145)

## 2016-07-03 LAB — CBC
HCT: 23.9 % — ABNORMAL LOW (ref 40.0–52.0)
Hemoglobin: 7.4 g/dL — ABNORMAL LOW (ref 13.0–18.0)
MCH: 26.6 pg (ref 26.0–34.0)
MCHC: 31.1 g/dL — ABNORMAL LOW (ref 32.0–36.0)
MCV: 85.6 fL (ref 80.0–100.0)
Platelets: 263 10*3/uL (ref 150–440)
RBC: 2.8 MIL/uL — AB (ref 4.40–5.90)
RDW: 17.3 % — AB (ref 11.5–14.5)
WBC: 10.7 10*3/uL — ABNORMAL HIGH (ref 3.8–10.6)

## 2016-07-03 LAB — TROPONIN I: Troponin I: 0.12 ng/mL (ref <0.03)

## 2016-07-03 LAB — BRAIN NATRIURETIC PEPTIDE: B Natriuretic Peptide: 3107 pg/mL — ABNORMAL HIGH (ref 0.0–100.0)

## 2016-07-03 MED ORDER — SENNOSIDES-DOCUSATE SODIUM 8.6-50 MG PO TABS: 1.0000 | ORAL_TABLET | Freq: Every evening | ORAL | Status: DC | PRN

## 2016-07-03 MED ORDER — PANTOPRAZOLE SODIUM 40 MG PO TBEC
40.0000 mg | DELAYED_RELEASE_TABLET | Freq: Every day | ORAL | Status: DC
  Administered 2016-07-04 – 2016-07-05 (×2): 40 mg via ORAL
  Filled 2016-07-03 (×2): qty 1

## 2016-07-03 MED ORDER — AMLODIPINE BESYLATE 5 MG PO TABS
5.0000 mg | ORAL_TABLET | Freq: Every day | ORAL | Status: DC
  Administered 2016-07-04: 5 mg via ORAL
  Filled 2016-07-03: qty 1

## 2016-07-03 MED ORDER — IPRATROPIUM-ALBUTEROL 0.5-2.5 (3) MG/3ML IN SOLN
3.0000 mL | Freq: Once | RESPIRATORY_TRACT | Status: AC
  Administered 2016-07-03: 3 mL via RESPIRATORY_TRACT
  Filled 2016-07-03: qty 3

## 2016-07-03 MED ORDER — SODIUM CHLORIDE 0.9% FLUSH
3.0000 mL | Freq: Two times a day (BID) | INTRAVENOUS | Status: DC
  Administered 2016-07-03 – 2016-07-04 (×3): 3 mL via INTRAVENOUS

## 2016-07-03 MED ORDER — NITROGLYCERIN 0.4 MG SL SUBL: 0.4000 mg | SUBLINGUAL_TABLET | SUBLINGUAL | Status: DC | PRN

## 2016-07-03 MED ORDER — SEVELAMER CARBONATE 800 MG PO TABS
2400.0000 mg | ORAL_TABLET | Freq: Three times a day (TID) | ORAL | Status: DC
  Administered 2016-07-04 – 2016-07-05 (×2): 2400 mg via ORAL
  Filled 2016-07-03 (×3): qty 3

## 2016-07-03 MED ORDER — SODIUM CHLORIDE 0.9% FLUSH: 3.0000 mL | INTRAVENOUS | Status: DC | PRN

## 2016-07-03 MED ORDER — ASPIRIN 325 MG PO TABS
325.0000 mg | ORAL_TABLET | Freq: Every day | ORAL | Status: DC
  Administered 2016-07-03 – 2016-07-04 (×2): 325 mg via ORAL
  Filled 2016-07-03 (×2): qty 1

## 2016-07-03 MED ORDER — LEVOFLOXACIN IN D5W 750 MG/150ML IV SOLN
750.0000 mg | Freq: Once | INTRAVENOUS | Status: AC
  Administered 2016-07-03: 750 mg via INTRAVENOUS
  Filled 2016-07-03: qty 150

## 2016-07-03 MED ORDER — ONDANSETRON HCL 4 MG PO TABS: 4.0000 mg | ORAL_TABLET | Freq: Four times a day (QID) | ORAL | Status: DC | PRN

## 2016-07-03 MED ORDER — SODIUM CHLORIDE 0.9 % IV SOLN: 250.0000 mL | INTRAVENOUS | Status: DC | PRN

## 2016-07-03 MED ORDER — ALBUTEROL SULFATE (2.5 MG/3ML) 0.083% IN NEBU: 2.5000 mg | INHALATION_SOLUTION | RESPIRATORY_TRACT | Status: DC | PRN

## 2016-07-03 MED ORDER — ONDANSETRON HCL 4 MG/2ML IJ SOLN
4.0000 mg | Freq: Four times a day (QID) | INTRAMUSCULAR | Status: DC | PRN
  Administered 2016-07-04: 4 mg via INTRAVENOUS
  Filled 2016-07-03: qty 2

## 2016-07-03 MED ORDER — NICOTINE 14 MG/24HR TD PT24
14.0000 mg | MEDICATED_PATCH | Freq: Every day | TRANSDERMAL | Status: DC
  Administered 2016-07-03 – 2016-07-04 (×2): 14 mg via TRANSDERMAL
  Filled 2016-07-03 (×2): qty 1

## 2016-07-03 MED ORDER — ATORVASTATIN CALCIUM 20 MG PO TABS
40.0000 mg | ORAL_TABLET | Freq: Every day | ORAL | Status: DC
  Administered 2016-07-04: 40 mg via ORAL
  Filled 2016-07-03: qty 2

## 2016-07-03 MED ORDER — HYDROCODONE-ACETAMINOPHEN 5-325 MG PO TABS
1.0000 | ORAL_TABLET | ORAL | Status: DC | PRN
  Administered 2016-07-03 – 2016-07-05 (×4): 1 via ORAL
  Filled 2016-07-03 (×4): qty 1

## 2016-07-03 MED ORDER — MORPHINE SULFATE (PF) 4 MG/ML IV SOLN
4.0000 mg | Freq: Once | INTRAVENOUS | Status: AC
  Administered 2016-07-03: 4 mg via INTRAVENOUS
  Filled 2016-07-03: qty 1

## 2016-07-03 MED ORDER — ACETAMINOPHEN 325 MG PO TABS: 650.0000 mg | ORAL_TABLET | Freq: Four times a day (QID) | ORAL | Status: DC | PRN

## 2016-07-03 MED ORDER — DEXTROSE 5 % IV SOLN
500.0000 mg | INTRAVENOUS | Status: DC
  Administered 2016-07-04: 500 mg via INTRAVENOUS
  Filled 2016-07-03 (×2): qty 500

## 2016-07-03 MED ORDER — FUROSEMIDE 80 MG PO TABS
80.0000 mg | ORAL_TABLET | Freq: Every day | ORAL | Status: DC
  Administered 2016-07-04: 80 mg via ORAL
  Filled 2016-07-03: qty 1

## 2016-07-03 MED ORDER — LABETALOL HCL 100 MG PO TABS
100.0000 mg | ORAL_TABLET | Freq: Every day | ORAL | Status: DC
  Administered 2016-07-04: 100 mg via ORAL
  Filled 2016-07-03: qty 1

## 2016-07-03 MED ORDER — ACETAMINOPHEN 650 MG RE SUPP: 650.0000 mg | Freq: Four times a day (QID) | RECTAL | Status: DC | PRN

## 2016-07-03 MED ORDER — GUAIFENESIN 100 MG/5ML PO SOLN
5.0000 mL | ORAL | Status: DC | PRN
  Administered 2016-07-04 – 2016-07-05 (×2): 100 mg via ORAL
  Filled 2016-07-03 (×5): qty 5

## 2016-07-03 MED ORDER — HYDROXYZINE HCL 25 MG PO TABS: 25.0000 mg | ORAL_TABLET | Freq: Three times a day (TID) | ORAL | Status: DC | PRN

## 2016-07-03 MED ORDER — IPRATROPIUM-ALBUTEROL 0.5-2.5 (3) MG/3ML IN SOLN
3.0000 mL | Freq: Four times a day (QID) | RESPIRATORY_TRACT | Status: DC
  Administered 2016-07-04 – 2016-07-05 (×6): 3 mL via RESPIRATORY_TRACT
  Filled 2016-07-03 (×7): qty 3

## 2016-07-03 MED ORDER — HEPARIN SODIUM (PORCINE) 5000 UNIT/ML IJ SOLN
5000.0000 [IU] | Freq: Three times a day (TID) | INTRAMUSCULAR | Status: DC
  Administered 2016-07-03 – 2016-07-05 (×4): 5000 [IU] via SUBCUTANEOUS
  Filled 2016-07-03 (×4): qty 1

## 2016-07-03 MED ORDER — MOMETASONE FURO-FORMOTEROL FUM 200-5 MCG/ACT IN AERO
2.0000 | INHALATION_SPRAY | Freq: Two times a day (BID) | RESPIRATORY_TRACT | Status: DC
  Administered 2016-07-03 – 2016-07-05 (×3): 2 via RESPIRATORY_TRACT
  Filled 2016-07-03: qty 8.8

## 2016-07-03 MED ORDER — METHYLPREDNISOLONE SODIUM SUCC 40 MG IJ SOLR
40.0000 mg | INTRAMUSCULAR | Status: DC
  Administered 2016-07-03 – 2016-07-04 (×2): 40 mg via INTRAVENOUS
  Filled 2016-07-03 (×2): qty 1

## 2016-07-03 MED ORDER — GABAPENTIN 300 MG PO CAPS
300.0000 mg | ORAL_CAPSULE | Freq: Every day | ORAL | Status: DC
  Administered 2016-07-03 – 2016-07-04 (×2): 300 mg via ORAL
  Filled 2016-07-03 (×2): qty 1

## 2016-07-03 NOTE — ED Notes (Signed)
Unsuccessful IV start x3, asking Brandy to attempt

## 2016-07-03 NOTE — H&P (Addendum)
[name]    MR#:  [id number]  DATE OF BIRTH:  1960/05/12  DATE OF ADMISSION:  07/03/2016  PRIMARY CARE PHYSICIAN: Elyse Jarvis, MD   REQUESTING/REFERRING PHYSICIAN: Earleen Newport, MD  CHIEF COMPLAINT:   Chief Complaint  Patient presents with  . Shortness of Breath   Cough with sputum. wheezing and shortness breath for several days. HISTORY OF PRESENT ILLNESS:  Stanley [name]  is a 56 y.o. male with a known history of Alcohol intake liver cirrhosis, CAD, hypertension, diabetes, ESRD on hemodialysis. The patient to present to the ED with the above chief complaint. He denies any fever or chills, no chest pain or palpitations or leg edema. The chest x-ray didn't show any infiltrate or pulmonary edema. He was found hypoxia with O2 saturation decreased to 80s, put on oxygen by nasal cannular and treated with nebulizer. He has no history of COPD, but he smoker and using Symbicort at home. He got hemodialysis yesterday. In addition, his abdomen is more distended. He said he got a paracentesis recently.  PAST MEDICAL HISTORY:   Past Medical History:  Diagnosis Date  . Alcohol abuse   . Cirrhosis ([place])   . Coronary artery disease 2009  . Diabetic peripheral neuropathy ([place])   . Drug abuse   . End stage renal disease on dialysis [facility]) NEPHROLOGIST-   DR [facility]  IN [place]   HEMODIALYSIS --   TUES/  THURS/  SAT  . GERD (gastroesophageal reflux disease)   . Hyperlipidemia   . Hypertension   . PAD (peripheral artery disease) ([place])   . Renal insufficiency    Per pt, 32 oz fluid restriction per day  . S/P triple vessel bypass 06/09/2016   2009ish  . Suicidal ideation    & HOMICIDAL IDEATION --  06-16-2013   ADMITTED TO BEHAVIOR HEALTH    PAST SURGICAL HISTORY:   Past Surgical History:  Procedure Laterality Date  . AGILE CAPSULE N/A 06/19/2016   Procedure: AGILE CAPSULE;  Surgeon: [name], MD;   Location: [facility] ENDOSCOPY;  Service: Endoscopy;  Laterality: N/A;  . COLONOSCOPY WITH PROPOFOL N/A 06/18/2016   Procedure: COLONOSCOPY WITH PROPOFOL;  Surgeon: [name], MD;  Location: [facility] ENDOSCOPY;  Service: Endoscopy;  Laterality: N/A;  . CORONARY ANGIOPLASTY  ?   PT UNABLE TO TELL IF  BEFORE OR AFTER  CABG  . CORONARY ARTERY BYPASS GRAFT  2008  ([place] , [place])   3 VESSEL  . DIALYSIS FISTULA CREATION  LAST SURGERY  APPOX  2008  . ENTEROSCOPY N/A 05/10/2016   Procedure: ENTEROSCOPY;  Surgeon: Jerene Bears, MD;  Location: [place];  Service: Gastroenterology;  Laterality: N/A;  . ESOPHAGOGASTRODUODENOSCOPY N/A 05/07/2015   Procedure: ESOPHAGOGASTRODUODENOSCOPY (EGD);  Surgeon: Hulen Luster, MD;  Location: [facility] ENDOSCOPY;  Service: Endoscopy;  Laterality: N/A;  . ESOPHAGOGASTRODUODENOSCOPY (EGD) WITH PROPOFOL N/A 05/17/2015   Procedure: ESOPHAGOGASTRODUODENOSCOPY (EGD) WITH PROPOFOL;  Surgeon: [name], MD;  Location: [facility] ENDOSCOPY;  Service: Endoscopy;  Laterality: N/A;  . ESOPHAGOGASTRODUODENOSCOPY (EGD) WITH PROPOFOL N/A 01/20/2016   Procedure: ESOPHAGOGASTRODUODENOSCOPY (EGD) WITH PROPOFOL;  Surgeon: [name], MD;  Location: [facility] ENDOSCOPY;  Service: Endoscopy;  Laterality: N/A;  . ESOPHAGOGASTRODUODENOSCOPY (EGD) WITH PROPOFOL N/A 04/17/2016   Procedure: ESOPHAGOGASTRODUODENOSCOPY (EGD) WITH PROPOFOL;  Surgeon: Lin Landsman, MD;  Location: [facility] ENDOSCOPY;  Service: Gastroenterology;  Laterality: N/A;  . ESOPHAGOGASTRODUODENOSCOPY (EGD) WITH PROPOFOL  05/09/2016   Procedure: ESOPHAGOGASTRODUODENOSCOPY (EGD) WITH  PROPOFOL;  Surgeon: Jerene Bears, MD;  Location: [place];  Service: Endoscopy;;  . ESOPHAGOGASTRODUODENOSCOPY (EGD) WITH PROPOFOL N/A 06/16/2016   Procedure: ESOPHAGOGASTRODUODENOSCOPY (EGD) WITH PROPOFOL;  Surgeon: [name], MD;  Location: [facility] ENDOSCOPY;  Service: Endoscopy;  Laterality: N/A;  . GIVENS CAPSULE STUDY N/A 05/07/2016   Procedure: GIVENS CAPSULE STUDY;   Surgeon: Doran Stabler, MD;  Location: [place];  Service: Endoscopy;  Laterality: N/A;  . MANDIBULAR HARDWARE REMOVAL N/A 07/29/2013   Procedure: REMOVAL OF ARCH BARS;  Surgeon: Theodoro Kos, DO;  Location: [place];  Service: Plastics;  Laterality: N/A;  . ORIF MANDIBULAR FRACTURE N/A 06/05/2013   Procedure: REPAIR OF MANDIBULAR FRACTURE x 2 with maxillo-mandibular fixation ;  Surgeon: Theodoro Kos, DO;  Location: [place];  Service: Plastics;  Laterality: N/A;  . PERIPHERAL ARTERIAL STENT GRAFT Left     SOCIAL HISTORY:   Social History  Substance Use Topics  . Smoking status: Current Every Day Smoker    Packs/day: 0.15    Years: 40.00    Types: Cigarettes  . Smokeless tobacco: Never Used  . Alcohol use No     Comment: pt reports quitting after learning about cirrhosis    FAMILY HISTORY:   Family History  Problem Relation Age of Onset  . Colon cancer Mother   . Cancer Father   . Cancer Sister     DRUG ALLERGIES:  No Known Allergies  REVIEW OF SYSTEMS:   Review of Systems  Constitutional: Positive for malaise/fatigue. Negative for chills and fever.  HENT: Negative for congestion, sinus pain and sore throat.   Eyes: Negative for blurred vision and double vision.  Respiratory: Positive for cough, sputum production, shortness of breath and wheezing. Negative for hemoptysis and stridor.   Cardiovascular: Negative for chest pain, palpitations and leg swelling.  Gastrointestinal: Negative for abdominal pain, blood in stool, diarrhea, melena, nausea and vomiting.  Genitourinary: Negative for dysuria, flank pain, hematuria and urgency.  Musculoskeletal: Negative for back pain.  Skin: Negative for itching and rash.  Neurological: Negative for dizziness, focal weakness, loss of consciousness, weakness and headaches.  Psychiatric/Behavioral: Negative for depression. The patient is not nervous/anxious.     MEDICATIONS AT HOME:   Prior to Admission  medications   Medication Sig Start Date End Date Taking? Authorizing Provider  amLODipine (NORVASC) 5 MG tablet Take 5 mg by mouth daily.    Historical Provider, MD  atorvastatin (LIPITOR) 40 MG tablet Take 40 mg by mouth daily. 04/19/16   Historical Provider, MD  b complex-vitamin c-folic acid (NEPHRO-VITE) 0.8 MG TABS tablet Take 1 tablet by mouth daily.    Historical Provider, MD  budesonide-formoterol (SYMBICORT) 160-4.5 MCG/ACT inhaler Inhale 2 puffs into the lungs daily.    Historical Provider, MD  furosemide (LASIX) 80 MG tablet Take 80 mg by mouth daily. 05/11/16   Historical Provider, MD  gabapentin (NEURONTIN) 300 MG capsule Take 1 capsule (300 mg total) by mouth daily. Patient taking differently: Take 300 mg by mouth at bedtime.  03/27/15   Bettey Costa, MD  hydrOXYzine (VISTARIL) 25 MG capsule Take 1 capsule (25 mg total) by mouth 3 (three) times daily as needed for itching. 02/25/16   Jami L Hagler, PA-C  labetalol (NORMODYNE) 100 MG tablet Take 1 tablet (100 mg total) by mouth daily. 01/22/16   Epifanio Lesches, MD  Multiple Vitamins-Minerals-FA (DIALYVITE SUPREME D) 3 MG TABS Take 1 tablet by mouth daily. 05/27/16   Historical Provider, MD  mupirocin  ointment (BACTROBAN) 2 % Place 1 application into the nose 2 (two) times daily. 06/01/16   Theodoro Grist, MD  naproxen (NAPROSYN) 500 MG tablet Take 500 mg by mouth 2 (two) times daily as needed. for pain 04/28/16   Historical Provider, MD  nitroGLYCERIN (NITROSTAT) 0.4 MG SL tablet Place 1 tablet (0.4 mg total) under the tongue every 5 (five) minutes as needed. Patient taking differently: Place 0.4 mg under the tongue every 5 (five) minutes as needed for chest pain.  04/12/16   Wende Bushy, MD  oxyCODONE-acetaminophen (ROXICET) 5-325 MG tablet Take 1-2 tablets by mouth every 4 (four) hours as needed for severe pain. 04/21/16   Hinda Kehr, MD  pantoprazole (PROTONIX) 40 MG tablet Take 1 tablet (40 mg total) by mouth daily. 05/10/16   Shanker Kristeen Mans, MD  sevelamer carbonate (RENVELA) 800 MG tablet Take 3 tablets (2,400 mg total) by mouth 3 (three) times daily with meals. 06/01/16   Theodoro Grist, MD      VITAL SIGNS:  Blood pressure (!) 152/88, pulse (!) 101, temperature 98.5 F (36.9 C), temperature source Oral, resp. rate (!) 24, height 6\' 3"  (1.905 m), weight 199 lb (90.3 kg), SpO2 95 %.  PHYSICAL EXAMINATION:  Physical Exam  GENERAL:  56 y.o.-year-old patient lying in the bed with no acute distress.  EYES: Pupils equal, round, reactive to light and accommodation. No scleral icterus. Extraocular muscles intact.  HEENT: Head atraumatic, normocephalic. Oropharynx and nasopharynx clear.  NECK:  Supple, no jugular venous distention. No thyroid enlargement, no tenderness.  LUNGS: Bilateral rhonchi, crackles and wheezing, No use of accessory muscles of respiration.  CARDIOVASCULAR: S1, S2 normal. No murmurs, rubs, or gallops.  ABDOMEN: Soft, nontender, abdominal distention, positive ascites signs. Bowel sounds present. Unable to estimate whether organomegaly or mass.  EXTREMITIES: Trace bilateral leg edema, no cyanosis, or clubbing.  NEUROLOGIC: Cranial nerves II through XII are intact. Muscle strength 5/5 in all extremities. Sensation intact. Gait not checked.  PSYCHIATRIC: The patient is alert and oriented x 3.  SKIN: No obvious rash, lesion, or ulcer.   LABORATORY PANEL:   CBC  Recent Labs Lab 07/03/16 1735  WBC 12.1*  HGB 8.9*  HCT 27.9*  PLT 264   ------------------------------------------------------------------------------------------------------------------  Chemistries   Recent Labs Lab 07/03/16 1735  NA 140  K 4.8  CL 100*  CO2 27  GLUCOSE 88  BUN 53*  CREATININE 8.76*  CALCIUM 8.9   ------------------------------------------------------------------------------------------------------------------  Cardiac Enzymes  Recent Labs Lab 07/03/16 1735  TROPONINI 0.12*    ------------------------------------------------------------------------------------------------------------------  RADIOLOGY:  Dg Chest 2 View  Result Date: 07/03/2016 CLINICAL DATA:  Shortness of breath EXAM: CHEST  2 VIEW COMPARISON:  None. FINDINGS: Cardiomediastinal silhouette is enlarged. There is calcific aortic atherosclerosis. No focal airspace consolidation or pulmonary edema. No pneumothorax or pleural effusion. IMPRESSION: Unchanged cardiomegaly and aortic atherosclerosis without overt pulmonary edema. Electronically Signed   By: Ulyses Jarred M.D.   On: 07/03/2016 17:57      IMPRESSION AND PLAN:   Acute respiratory failure with hpoxia due to COPD exacerbation and acute bronchitis. The patient will be admitted to medical floor. Start IV Solu-Medrol, DuoNeb every 6 hours, Zithromax, Robitussin when necessary.  Elevated troponin. Due to demanding ischemia. Follow-up troponin level, continue aspirin.  Abdominal distention, possible ascites with history of alcoholic liver cirrhosis. Ultrasound guided paracentesis.  ESRD. Nephrology consult for hemodialysis.  Hypertension. Continue home hypertension medication. Anemia of chronic disease. Stable. Tobacco abuse. Smoking cessation was counseled for 4  minutes, given nicotine patch. All the records are reviewed and case discussed with ED provider. Management plans discussed with the patient, family and they are in agreement.  CODE STATUS: Full code  TOTAL TIME TAKING CARE OF THIS PATIENT: 57 minutes.    Demetrios Loll M.D on 07/03/2016 at 8:56 PM  Between 7am to 6pm - Pager - [phone]  After 6pm go to [email or web address] - Proofreader  [facility]  Office  [phone]  CC: Primary care physician; Elyse Jarvis, MD   Note: This dictation was prepared with Dragon dictation along with smaller phrase technology. Any transcriptional errors that result from this process are unintentional.

## 2016-07-03 NOTE — ED Provider Notes (Signed)
[facility] Emergency Department Provider Note       Time seen: ----------------------------------------- 5:39 PM on 07/03/2016 -----------------------------------------     I have reviewed the triage vital signs and the nursing notes.   HISTORY   Chief Complaint Shortness of Breath    HPI Stanley [name] is a 56 y.o. male who presents to the ED for shortness of breath. Patient says shortness of breath and productive cough for several days. Patient states he had dialysis yesterday and was normal. Patient has had marked brown sputum production and received DuoNeb's in route by EMS without significant improvement in symptoms. He denies fevers, chills, chest pain but does have shortness of breath and cough. He denies vomiting or diarrhea or changes in his medications.   Past Medical History:  Diagnosis Date  . Alcohol abuse   . Cirrhosis ([place])   . Coronary artery disease 2009  . Diabetic peripheral neuropathy ([place])   . Drug abuse   . End stage renal disease on dialysis [facility]) NEPHROLOGIST-   DR [facility]  IN [place]   HEMODIALYSIS --   TUES/  THURS/  SAT  . GERD (gastroesophageal reflux disease)   . Hyperlipidemia   . Hypertension   . PAD (peripheral artery disease) ([place])   . Renal insufficiency    Per pt, 32 oz fluid restriction per day  . S/P triple vessel bypass 06/09/2016   2009ish  . Suicidal ideation    & HOMICIDAL IDEATION --  06-16-2013   ADMITTED TO BEHAVIOR HEALTH    Patient Active Problem List   Diagnosis Date Noted  . GI bleeding 06/24/2016  . Rectal bleeding 06/14/2016  . Anemia of chronic disease 06/01/2016  . MRSA carrier 06/01/2016  . Chronic renal failure 05/23/2016  . Ischemic heart disease 05/23/2016  . Angiodysplasia of small intestine   . Melena   . Small bowel bleed not requiring more than 4 units of blood in 24 hours, ICU, or surgery   . Anemia due to chronic blood loss   . Abdominal pain 05/05/2016  . Acute  posthemorrhagic anemia 04/17/2016  . Gastrointestinal bleed 04/17/2016  . History of esophagogastroduodenoscopy (EGD) 04/17/2016  . Elevated troponin 04/17/2016  . Alcohol abuse 04/17/2016  . Upper GI bleed 01/19/2016  . Blood in stool   . Angiodysplasia of stomach and duodenum with hemorrhage   . Gastritis   . Esophagitis, unspecified   . GI bleed 05/16/2015  . Acute GI bleeding   . Symptomatic anemia 04/30/2015  . HTN (hypertension) 04/06/2015  . GERD (gastroesophageal reflux disease) 04/06/2015  . HLD (hyperlipidemia) 04/06/2015  . Dyspnea 04/06/2015  . Cirrhosis of liver with ascites ([place]) 04/06/2015  . Ascites 04/06/2015  . GIB (gastrointestinal bleeding) 03/23/2015  . Homicidal ideation 06/19/2013  . Suicidal intent 06/19/2013  . Homicidal ideations 06/19/2013  . Hyperkalemia 06/16/2013  . Mandible fracture ([place]) 06/05/2013  . Fracture, mandible ([place]) 06/02/2013  . Coronary atherosclerosis of native coronary artery 06/02/2013  . ESRD on dialysis ([place]) 06/02/2013  . Mandible open fracture ([place]) 06/02/2013    Past Surgical History:  Procedure Laterality Date  . AGILE CAPSULE N/A 06/19/2016   Procedure: AGILE CAPSULE;  Surgeon: [name], MD;  Location: [facility] ENDOSCOPY;  Service: Endoscopy;  Laterality: N/A;  . COLONOSCOPY WITH PROPOFOL N/A 06/18/2016   Procedure: COLONOSCOPY WITH PROPOFOL;  Surgeon: [name], MD;  Location: [facility] ENDOSCOPY;  Service: Endoscopy;  Laterality: N/A;  . CORONARY ANGIOPLASTY  ?   PT UNABLE TO TELL IF  BEFORE OR AFTER  CABG  . CORONARY ARTERY BYPASS GRAFT  2008  ([place] , [place])   3 VESSEL  . DIALYSIS FISTULA CREATION  LAST SURGERY  APPOX  2008  . ENTEROSCOPY N/A 05/10/2016   Procedure: ENTEROSCOPY;  Surgeon: Jerene Bears, MD;  Location: [place];  Service: Gastroenterology;  Laterality: N/A;  . ESOPHAGOGASTRODUODENOSCOPY N/A 05/07/2015   Procedure: ESOPHAGOGASTRODUODENOSCOPY (EGD);  Surgeon: Hulen Luster, MD;  Location: [facility] ENDOSCOPY;  Service:  Endoscopy;  Laterality: N/A;  . ESOPHAGOGASTRODUODENOSCOPY (EGD) WITH PROPOFOL N/A 05/17/2015   Procedure: ESOPHAGOGASTRODUODENOSCOPY (EGD) WITH PROPOFOL;  Surgeon: [name], MD;  Location: [facility] ENDOSCOPY;  Service: Endoscopy;  Laterality: N/A;  . ESOPHAGOGASTRODUODENOSCOPY (EGD) WITH PROPOFOL N/A 01/20/2016   Procedure: ESOPHAGOGASTRODUODENOSCOPY (EGD) WITH PROPOFOL;  Surgeon: [name], MD;  Location: [facility] ENDOSCOPY;  Service: Endoscopy;  Laterality: N/A;  . ESOPHAGOGASTRODUODENOSCOPY (EGD) WITH PROPOFOL N/A 04/17/2016   Procedure: ESOPHAGOGASTRODUODENOSCOPY (EGD) WITH PROPOFOL;  Surgeon: Lin Landsman, MD;  Location: [facility] ENDOSCOPY;  Service: Gastroenterology;  Laterality: N/A;  . ESOPHAGOGASTRODUODENOSCOPY (EGD) WITH PROPOFOL  05/09/2016   Procedure: ESOPHAGOGASTRODUODENOSCOPY (EGD) WITH PROPOFOL;  Surgeon: Jerene Bears, MD;  Location: [place];  Service: Endoscopy;;  . ESOPHAGOGASTRODUODENOSCOPY (EGD) WITH PROPOFOL N/A 06/16/2016   Procedure: ESOPHAGOGASTRODUODENOSCOPY (EGD) WITH PROPOFOL;  Surgeon: [name], MD;  Location: [facility] ENDOSCOPY;  Service: Endoscopy;  Laterality: N/A;  . GIVENS CAPSULE STUDY N/A 05/07/2016   Procedure: GIVENS CAPSULE STUDY;  Surgeon: Doran Stabler, MD;  Location: [place];  Service: Endoscopy;  Laterality: N/A;  . MANDIBULAR HARDWARE REMOVAL N/A 07/29/2013   Procedure: REMOVAL OF ARCH BARS;  Surgeon: Theodoro Kos, DO;  Location: [place];  Service: Plastics;  Laterality: N/A;  . ORIF MANDIBULAR FRACTURE N/A 06/05/2013   Procedure: REPAIR OF MANDIBULAR FRACTURE x 2 with maxillo-mandibular fixation ;  Surgeon: Theodoro Kos, DO;  Location: [place];  Service: Plastics;  Laterality: N/A;  . PERIPHERAL ARTERIAL STENT GRAFT Left     Allergies Patient has no known allergies.  Social History Social History  Substance Use Topics  . Smoking status: Current Every Day Smoker    Packs/day: 0.15    Years: 40.00    Types: Cigarettes  .  Smokeless tobacco: Never Used  . Alcohol use No     Comment: pt reports quitting after learning about cirrhosis    Review of Systems Constitutional: Negative for fever. Cardiovascular: Negative for chest pain. Respiratory: Positive shortness of breath and cough Gastrointestinal: Negative for abdominal pain, vomiting and diarrhea. Genitourinary: Negative for dysuria. Musculoskeletal: Negative for back pain. Skin: Negative for rash. Neurological: Negative for headaches, positive for generalized weakness  10-point ROS otherwise negative.  ____________________________________________   PHYSICAL EXAM:  VITAL SIGNS: ED Triage Vitals  Enc Vitals Group     BP 07/03/16 1732 (!) 154/86     Pulse Rate 07/03/16 1732 93     Resp 07/03/16 1732 18     Temp 07/03/16 1732 98.5 F (36.9 C)     Temp Source 07/03/16 1732 Oral     SpO2 07/03/16 1731 95 %     Weight 07/03/16 1733 199 lb (90.3 kg)     Height 07/03/16 1733 6\' 3"  (1.905 m)     Head Circumference --      Peak Flow --      Pain Score --      Pain Loc --      Pain Edu? --      Excl. in [place]? --  Constitutional: Alert and oriented.Mild distress Eyes: Conjunctivae are normal. PERRL. Normal extraocular movements. ENT   Head: Normocephalic and atraumatic.   Nose: No congestion/rhinnorhea.   Mouth/Throat: Mucous membranes are moist.   Neck: No stridor. Cardiovascular: Normal rate, regular rhythm. No murmurs, rubs, or gallops. Respiratory: Normal respiratory effort with rhonchi bilaterally Gastrointestinal: Soft and nontender. Normal bowel sounds Musculoskeletal: Nontender with normal range of motion in extremities. No lower extremity tenderness nor edema. Neurologic:  Normal speech and language. No gross focal neurologic deficits are appreciated.  Skin:  Skin is warm, dry and intact. No rash noted. Psychiatric: Mood and affect are normal. Speech and behavior are normal.   ____________________________________________  EKG: Interpreted by me.Sinus rhythm rate of 93 bpm, normal PR interval, likely LVH, normal QT, normal axis  ____________________________________________  ED COURSE:  Pertinent labs & imaging results that were available during my care of the patient were reviewed by me and considered in my medical decision making (see chart for details). Patient presents for shortness of breath and cough, we will assess with labs and imaging as indicated.   Procedures ____________________________________________   LABS (pertinent positives/negatives)  Labs Reviewed  CBC WITH DIFFERENTIAL/PLATELET - Abnormal; Notable for the following:       Result Value   WBC 12.1 (*)    RBC 3.18 (*)    Hemoglobin 8.9 (*)    HCT 27.9 (*)    MCHC 31.7 (*)    RDW 17.5 (*)    Neutro Abs 9.6 (*)    Lymphs Abs 0.8 (*)    Monocytes Absolute 1.6 (*)    All other components within normal limits  BASIC METABOLIC PANEL - Abnormal; Notable for the following:    Chloride 100 (*)    BUN 53 (*)    Creatinine, Ser 8.76 (*)    GFR calc non Af Amer 6 (*)    GFR calc Af Amer 7 (*)    All other components within normal limits  BRAIN NATRIURETIC PEPTIDE - Abnormal; Notable for the following:    B Natriuretic Peptide 3,107.0 (*)    All other components within normal limits  TROPONIN I - Abnormal; Notable for the following:    Troponin I 0.12 (*)    All other components within normal limits    RADIOLOGY Images were viewed by me  Chest x-ray IMPRESSION: Unchanged cardiomegaly and aortic atherosclerosis without overt pulmonary edema. ____________________________________________  FINAL ASSESSMENT AND PLAN  Dyspnea, End-stage renal disease, elevated troponin  Plan: Patient's labs and imaging were dictated above. Patient had presented for shortness of breath which is likely multifactorial. He does have end-stage renal disease and even though he had dialysis yesterday  patient still feels for short of breath. He also has abdominal ascites and may need a paracentesis to improve his shortness of breath. I will order an ultrasound guided paracentesis. I will discuss with the hospitalist for admission.   Earleen Newport, MD   Note: This note was generated in part or whole with voice recognition software. Voice recognition is usually quite accurate but there are transcription errors that can and very often do occur. I apologize for any typographical errors that were not detected and corrected.     Earleen Newport, MD 07/03/16 2016

## 2016-07-03 NOTE — ED Notes (Signed)
Hospitalist at bedside 

## 2016-07-03 NOTE — ED Notes (Signed)
Date and time results received: 07/03/16 1930  Test: Troponin Critical Value: 0.12  Name of Provider Notified: Jimmye Norman, MD  Orders Received? Or Actions Taken?: No orders given/actions taken

## 2016-07-03 NOTE — ED Notes (Signed)
Pt transport to 206

## 2016-07-04 ENCOUNTER — Inpatient Hospital Stay: Payer: Medicare Other

## 2016-07-04 ENCOUNTER — Telehealth: Payer: Self-pay

## 2016-07-04 LAB — BASIC METABOLIC PANEL
ANION GAP: 11 (ref 5–15)
BUN: 55 mg/dL — ABNORMAL HIGH (ref 6–20)
CALCIUM: 8.7 mg/dL — AB (ref 8.9–10.3)
CO2: 26 mmol/L (ref 22–32)
CREATININE: 8.75 mg/dL — AB (ref 0.61–1.24)
Chloride: 102 mmol/L (ref 101–111)
GFR, EST AFRICAN AMERICAN: 7 mL/min — AB (ref >60)
GFR, EST NON AFRICAN AMERICAN: 6 mL/min — AB (ref >60)
Glucose, Bld: 127 mg/dL — ABNORMAL HIGH (ref 65–99)
Potassium: 4.8 mmol/L (ref 3.5–5.1)
Sodium: 139 mmol/L (ref 135–145)

## 2016-07-04 LAB — HEMOGLOBIN: HEMOGLOBIN: 7.5 g/dL — AB (ref 13.0–18.0)

## 2016-07-04 LAB — MRSA PCR SCREENING: MRSA BY PCR: NEGATIVE

## 2016-07-04 LAB — TROPONIN I: Troponin I: 0.12 ng/mL (ref <0.03)

## 2016-07-04 MED ORDER — ALUM & MAG HYDROXIDE-SIMETH 200-200-20 MG/5ML PO SUSP
30.0000 mL | Freq: Four times a day (QID) | ORAL | Status: DC | PRN
  Administered 2016-07-04 – 2016-07-05 (×2): 30 mL via ORAL
  Filled 2016-07-04 (×2): qty 30

## 2016-07-04 NOTE — Progress Notes (Signed)
[facility]  ROUNDING NOTE   Subjective:  Patient well-known to [name] from outpatient dialysis. He presents now with worsening shortness of breath as well as abdominal distention. Patient states that he did go to dialysis on Saturday. Patient was found to be hypoxic when he first presented. Objective:  Vital signs in last 24 hours:  Temp:  [97.4 F (36.3 C)-99.8 F (37.7 C)] 97.6 F (36.4 C) (04/23 1212) Pulse Rate:  [70-101] 75 (04/23 1413) Resp:  [16-25] 16 (04/23 1212) BP: (125-165)/(66-92) 150/89 (04/23 1413) SpO2:  [94 %-100 %] 99 % (04/23 1413) Weight:  [85.1 kg (187 lb 11.2 oz)-90.3 kg (199 lb)] 85.1 kg (187 lb 11.2 oz) (04/22 2159)  Weight change:  Filed Weights   07/03/16 1733 07/03/16 2159  Weight: 90.3 kg (199 lb) 85.1 kg (187 lb 11.2 oz)    Intake/Output: I/O last 3 completed shifts: In: 611.1 [P.O.:480; IV Piggyback:131.1] Out: 200 [Urine:200]   Intake/Output this shift:  Total I/O In: 240 [P.O.:240] Out: -   Physical Exam: General: No acute distress  Head: Normocephalic, atraumatic. Moist oral mucosal membranes  Eyes: Anicteric  Neck: Supple, trachea midline  Lungs:  Clear to auscultation, normal effort  Heart: S1S2 no rubs  Abdomen:  Soft, nontender, distension noted  Extremities: No peripheral edema.  Neurologic: Nonfocal, moving all four extremities  Skin: No lesions  Access: RUE AVF    Basic Metabolic Panel:  Recent Labs Lab 07/03/16 1735 07/03/16 2327  NA 140 139  K 4.8 4.8  CL 100* 102  CO2 27 26  GLUCOSE 88 127*  BUN 53* 55*  CREATININE 8.76* 8.75*  CALCIUM 8.9 8.7*    Liver Function Tests: No results for input(s): AST, ALT, ALKPHOS, BILITOT, PROT, ALBUMIN in the last 168 hours. No results for input(s): LIPASE, AMYLASE in the last 168 hours. No results for input(s): AMMONIA in the last 168 hours.  CBC:  Recent Labs Lab 07/03/16 1735 07/03/16 2327 07/04/16 0505  WBC 12.1* 10.7*  --   NEUTROABS 9.6*  --   --    HGB 8.9* 7.4* 7.5*  HCT 27.9* 23.9*  --   MCV 87.9 85.6  --   PLT 264 263  --     Cardiac Enzymes:  Recent Labs Lab 07/03/16 1735 07/03/16 2327  TROPONINI 0.12* 0.12*    BNP: Invalid input(s): POCBNP  CBG: No results for input(s): GLUCAP in the last 168 hours.  Microbiology: Results for orders placed or performed during the hospital encounter of 07/03/16  MRSA PCR Screening     Status: None   Collection Time: 07/03/16 10:33 PM  Result Value Ref Range Status   MRSA by PCR NEGATIVE NEGATIVE Final    Comment:        The GeneXpert MRSA Assay (FDA approved for NASAL specimens only), is one component of a comprehensive MRSA colonization surveillance program. It is not intended to diagnose MRSA infection nor to guide or monitor treatment for MRSA infections.     Coagulation Studies: No results for input(s): LABPROT, INR in the last 72 hours.  Urinalysis: No results for input(s): COLORURINE, LABSPEC, PHURINE, GLUCOSEU, HGBUR, BILIRUBINUR, KETONESUR, PROTEINUR, UROBILINOGEN, NITRITE, LEUKOCYTESUR in the last 72 hours.  Invalid input(s): APPERANCEUR    Imaging: Dg Chest 2 View  Result Date: 07/03/2016 CLINICAL DATA:  Shortness of breath EXAM: CHEST  2 VIEW COMPARISON:  None. FINDINGS: Cardiomediastinal silhouette is enlarged. There is calcific aortic atherosclerosis. No focal airspace consolidation or pulmonary edema. No pneumothorax or pleural  effusion. IMPRESSION: Unchanged cardiomegaly and aortic atherosclerosis without overt pulmonary edema. Electronically Signed   By: Ulyses Jarred M.D.   On: 07/03/2016 17:57     Medications:   . sodium chloride    . azithromycin     . amLODipine  5 mg Oral Daily  . aspirin  325 mg Oral Daily  . atorvastatin  40 mg Oral Daily  . furosemide  80 mg Oral Daily  . gabapentin  300 mg Oral QHS  . heparin  5,000 Units Subcutaneous Q8H  . ipratropium-albuterol  3 mL Nebulization Q6H  . labetalol  100 mg Oral Daily  .  methylPREDNISolone (SOLU-MEDROL) injection  40 mg Intravenous Q24H  . mometasone-formoterol  2 puff Inhalation BID  . nicotine  14 mg Transdermal Daily  . pantoprazole  40 mg Oral Daily  . sevelamer carbonate  2,400 mg Oral TID WC  . sodium chloride flush  3 mL Intravenous Q12H   sodium chloride, acetaminophen **OR** acetaminophen, albuterol, guaiFENesin, HYDROcodone-acetaminophen, hydrOXYzine, nitroGLYCERIN, ondansetron **OR** ondansetron (ZOFRAN) IV, senna-docusate, sodium chloride flush  Assessment/ Plan:  56 y.o. male with ESRD on hemodialysis, hypertension, peripheral vascular disease, coronary artery disease, cirrhosis, substance abuse, recurrent GI bleed, admission for SOB 07/03/16.  TTS [facility] [place] Right arm AVF   1. End stage renal disease: TTS. Patient breathing comfortably at the moment.  No acute indication for dialysis today.  We will plan for hemodialysis again tomorrow.  2. Hypertension: continue labetalol.  3. Anemia of chronic kidney disease with recent GI blood loss:  Hemoglobin currently 7.5.  His hemoglobin has been fluctuating recently.  He has been evaluated by gastroenterology recently.  Continue to monitor hemoglobin closely.  4. Secondary Hyperparathyroidism:  - check phosphorus with next dialysis treatment.  For now continue Renvela 2400 mg by mouth 3 times a day with meals.   LOS: 1 Kimble Delaurentis 4/23/20182:30 PM

## 2016-07-04 NOTE — Progress Notes (Signed)
[place] [facility] [place] NAME: Stanley [name]    MR#:  [id number]  DATE OF BIRTH:  [birth year]  SUBJECTIVE:  CHIEF COMPLAINT:  Patient's breathing is slightly better while resting. Reporting abdominal tightness. Had therapeutic paracentesis 2 months ago  REVIEW OF SYSTEMS:  CONSTITUTIONAL: No fever, fatigue or weakness.  EYES: No blurred or double vision.  EARS, NOSE, AND THROAT: No tinnitus or ear pain.  RESPIRATORY: No cough, Reports his shortness of breath is better and denies any wheezing or hemoptysis CARDIOVASCULAR: No chest pain, orthopnea, edema.  GASTROINTESTINAL: No nausea, vomiting, diarrhea or abdominal pain.  GENITOURINARY: No dysuria, hematuria.  ENDOCRINE: No polyuria, nocturia,  HEMATOLOGY: No anemia, easy bruising or bleeding SKIN: No rash or lesion. MUSCULOSKELETAL: No joint pain or arthritis.   NEUROLOGIC: No tingling, numbness, weakness.  PSYCHIATRY: No anxiety or depression.   DRUG ALLERGIES:  No Known Allergies  VITALS:  Blood pressure 131/79, pulse 75, temperature 97.4 F (36.3 C), temperature source Oral, resp. rate 16, height 6\' 3"  (1.905 m), weight 85.1 kg (187 lb 11.2 oz), SpO2 99 %.  PHYSICAL EXAMINATION:  GENERAL:  56 y.o.-year-old patient lying in the bed with no acute distress.  EYES: Pupils equal, round, reactive to light and accommodation. No scleral icterus. Extraocular muscles intact.  HEENT: Head atraumatic, normocephalic. Oropharynx and nasopharynx clear.  NECK:  Supple, no jugular venous distention. No thyroid enlargement, no tenderness.  LUNGS: Diminished breath sounds in the lower lung fields , no wheezing, rales,rhonchi or crepitation. No use of accessory muscles of respiration.  CARDIOVASCULAR: S1, S2 normal. No murmurs, rubs, or gallops.  ABDOMEN: Soft, nontender, distended. Bowel sounds present. No organomegaly or mass.  EXTREMITIES: No pedal edema, cyanosis, or clubbing.  NEUROLOGIC:  Cranial nerves II through XII are intact. Muscle strength 5/5 in all extremities. Sensation intact. Gait not checked.  PSYCHIATRIC: The patient is alert and oriented x 3.  SKIN: No obvious rash, lesion, or ulcer.    LABORATORY PANEL:   CBC  Recent Labs Lab 07/03/16 2327 07/04/16 0505  WBC 10.7*  --   HGB 7.4* 7.5*  HCT 23.9*  --   PLT 263  --    ------------------------------------------------------------------------------------------------------------------  Chemistries   Recent Labs Lab 07/03/16 2327  NA 139  K 4.8  CL 102  CO2 26  GLUCOSE 127*  BUN 55*  CREATININE 8.75*  CALCIUM 8.7*   ------------------------------------------------------------------------------------------------------------------  Cardiac Enzymes  Recent Labs Lab 07/03/16 2327  TROPONINI 0.12*   ------------------------------------------------------------------------------------------------------------------  RADIOLOGY:  Dg Chest 2 View  Result Date: 07/03/2016 CLINICAL DATA:  Shortness of breath EXAM: CHEST  2 VIEW COMPARISON:  None. FINDINGS: Cardiomediastinal silhouette is enlarged. There is calcific aortic atherosclerosis. No focal airspace consolidation or pulmonary edema. No pneumothorax or pleural effusion. IMPRESSION: Unchanged cardiomegaly and aortic atherosclerosis without overt pulmonary edema. Electronically Signed   By: Ulyses Jarred M.D.   On: 07/03/2016 17:57    EKG:   Orders placed or performed during the hospital encounter of 07/03/16  . ED EKG  . ED EKG  . EKG 12-Lead  . EKG 12-Lead    ASSESSMENT AND PLAN:    #Acute respiratory failure with hpoxia due to COPD exacerbation and acute bronchitisWith component of abdominal distention from ascites  Continue Solu-Medrol, DuoNeb every 6 hours, Zithromax, Robitussin when necessary.  Elevated troponin. Due to demanding ischemia. Troponin is 0.122 non-trending. Patient  is asymptomatic continue aspirin.  Abdominal  distention, possible 2/2 ascites with history of  alcoholic liver cirrhosis. Scheduled for ultrasound-guided paracentesis today discussed with interventional radiologist   ESRD. Nephrology consult for hemodialysis.  Hypertension. Continue home hypertension medication.  Anemia of chronic disease. Stable.  Tobacco abuse. Smoking cessation was provided by the admitting physician for 4 minutes, given nicotine patch.   All the records are reviewed and case discussed with Care Management/Social Workerr. Management plans discussed with the patient, family and they are in agreement.  CODE STATUS: FC   TOTAL TIME TAKING CARE OF THIS PATIENT: 57minutes.   POSSIBLE D/C IN 2-3 DAYS, DEPENDING ON CLINICAL CONDITION.  Note: This dictation was prepared with Dragon dictation along with smaller phrase technology. Any transcriptional errors that result from this process are unintentional.   Nicholes Mango M.D on 07/04/2016 at 9:58 AM  Between 7am to 6pm - Pager - [phone] After 6pm go to [email or web address] - password [email or web address] [facility]  Office  [phone]  CC: Primary care physician; Elyse Jarvis, MD

## 2016-07-04 NOTE — Care Management (Signed)
Amanda Morris HD liaison notified of admission.  

## 2016-07-04 NOTE — Telephone Encounter (Signed)
-----   Message from [name], MD sent at 06/30/2016  4:06 PM EDT ----- Thank you for the update Dr Anson Fret,   Can you set up follow up for the patient in my office to discuss results of the capsule test and the next steps  Kiran  ----- Message ----- From: Lavonia Dana, MD Sent: 06/30/2016   3:58 PM To: [name], MD  I saw Mr. Byrns today at dialysis. He is asking if he needs a second opinion at [facility]. I said I would ask.   Lavonia Dana, MD

## 2016-07-04 NOTE — Telephone Encounter (Signed)
Unable to leave VM for patient callback.   Per Dr. Vicente Males, follow-up appt needed.

## 2016-07-04 NOTE — Procedures (Signed)
Ultrasound-guided therapeutic paracentesis performed yielding 2 liters of amber colored fluid. 2L max was given.  No immediate complications.  Stanley [name] E 2:47 PM 07/04/2016

## 2016-07-05 ENCOUNTER — Other Ambulatory Visit: Payer: Medicare Other

## 2016-07-05 LAB — PHOSPHORUS: Phosphorus: 7.5 mg/dL — ABNORMAL HIGH (ref 2.5–4.6)

## 2016-07-05 MED ORDER — HEPARIN SODIUM (PORCINE) 1000 UNIT/ML DIALYSIS
1000.0000 [IU] | INTRAMUSCULAR | Status: DC | PRN
  Filled 2016-07-05: qty 1

## 2016-07-05 MED ORDER — LIDOCAINE HCL (PF) 1 % IJ SOLN
5.0000 mL | INTRAMUSCULAR | Status: DC | PRN
  Filled 2016-07-05: qty 5

## 2016-07-05 MED ORDER — ALTEPLASE 2 MG IJ SOLR: 2.0000 mg | Freq: Once | INTRAMUSCULAR | Status: DC | PRN

## 2016-07-05 MED ORDER — AZITHROMYCIN 250 MG PO TABS: 500.0000 mg | ORAL_TABLET | Freq: Every day | ORAL | Status: DC

## 2016-07-05 MED ORDER — EPOETIN ALFA 20000 UNIT/ML IJ SOLN
14000.0000 [IU] | INTRAMUSCULAR | Status: DC
  Filled 2016-07-05: qty 1

## 2016-07-05 MED ORDER — SODIUM CHLORIDE 0.9 % IV SOLN: 100.0000 mL | INTRAVENOUS | Status: DC | PRN

## 2016-07-05 MED ORDER — LIDOCAINE-PRILOCAINE 2.5-2.5 % EX CREA
1.0000 "application " | TOPICAL_CREAM | CUTANEOUS | Status: DC | PRN
  Filled 2016-07-05: qty 5

## 2016-07-05 MED ORDER — PENTAFLUOROPROP-TETRAFLUOROETH EX AERO
1.0000 "application " | INHALATION_SPRAY | CUTANEOUS | Status: DC | PRN
  Filled 2016-07-05: qty 30

## 2016-07-05 MED ORDER — FAMOTIDINE 20 MG PO TABS
20.0000 mg | ORAL_TABLET | Freq: Every day | ORAL | Status: DC
  Administered 2016-07-05: 20 mg via ORAL
  Filled 2016-07-05: qty 1

## 2016-07-05 NOTE — Progress Notes (Signed)
HD initiated without issue  

## 2016-07-05 NOTE — Discharge Summary (Signed)
DOA 07/03/16 LEFT AMA 07/05/16  HPI Stanley [name]  is a 56 y.o. male with a known history of Alcohol intake liver cirrhosis, CAD, hypertension, diabetes, ESRD on hemodialysis. The patient to present to the ED with the above chief complaint. He denies any fever or chills, no chest pain or palpitations or leg edema. The chest x-ray didn't show any infiltrate or pulmonary edema. He was found hypoxia with O2 saturation decreased to 80s, put on oxygen by nasal cannular and treated with nebulizer. He has no history of COPD, but he smoker and using Symbicort at home. He got hemodialysis yesterday. In addition, his abdomen is more distended. He said he got a paracentesis recently.   Hospital course  #Acute respiratory failure with hpoxia due to COPD exacerbation and acute bronchitisWith component of abdominal distention from ascites  Continue Solu-Medrol, DuoNeb every 6 hours, Zithromax, Robitussin when necessary.  Elevated troponin. Due to demanding ischemia. Troponin is 0.122 non-trending. Patient  is asymptomatic continue aspirin.  Abdominal distention, possible 2/2 ascites with history of alcoholic liver cirrhosis.  patient had ultrasound-guided paracentesis 07/04/16  ESRD. Nephrology consult for hemodialysis. Patient had hemodialysis today  Hypertension. Continue home hypertension medication.  Anemia of chronic disease. Stable.  Tobacco abuse. Smoking cessation was provided by the admitting physician for 4 minutes, given nicotine patch.  Patient had hemodialysis today and following that Patient has called his ride and decided to leave the hospital in his medical advice at around 3:30 PM today as reported by the nurse   Total time spent 35 minutes

## 2016-07-05 NOTE — Progress Notes (Addendum)
Patient wanted to be discharged but not willing to wait MD to complete paper work. Patient also has not had his daily meds today since he was in dialysis all morning.  Patient left AMA. MD was notified.

## 2016-07-05 NOTE — Progress Notes (Signed)
Pre HD  

## 2016-07-05 NOTE — Progress Notes (Signed)
[facility]  ROUNDING NOTE   Subjective:  Patient had abdominal paracentesis performed yesterday. 2 L of ascites was removed. Patient states that he is now breathing easier today. Patient also due for dialysis today.  Objective:  Vital signs in last 24 hours:  Temp:  [97.4 F (36.3 C)-98 F (36.7 C)] 98 F (36.7 C) (04/24 1146) Pulse Rate:  [70-76] 72 (04/24 1154) Resp:  [15-22] 15 (04/24 1154) BP: (132-150)/(75-89) 137/75 (04/24 1154) SpO2:  [94 %-100 %] 95 % (04/24 1154) Weight:  [84.5 kg (186 lb 4.6 oz)] 84.5 kg (186 lb 4.6 oz) (04/24 1146)  Weight change:  Filed Weights   07/03/16 1733 07/03/16 2159 07/05/16 1146  Weight: 90.3 kg (199 lb) 85.1 kg (187 lb 11.2 oz) 84.5 kg (186 lb 4.6 oz)    Intake/Output: I/O last 3 completed shifts: In: 1457.1 [P.O.:1076; IV Piggyback:381.1] Out: -    Intake/Output this shift:  Total I/O In: 360 [P.O.:360] Out: -   Physical Exam: General: No acute distress  Head: Normocephalic, atraumatic. Moist oral mucosal membranes  Eyes: Anicteric  Neck: Supple, trachea midline  Lungs:  Clear to auscultation, normal effort  Heart: S1S2 no rubs  Abdomen:  Soft, nontender, mild distension  Extremities: No peripheral edema.  Neurologic: Nonfocal, moving all four extremities  Skin: No lesions  Access: RUE AVF    Basic Metabolic Panel:  Recent Labs Lab 07/03/16 1735 07/03/16 2327  NA 140 139  K 4.8 4.8  CL 100* 102  CO2 27 26  GLUCOSE 88 127*  BUN 53* 55*  CREATININE 8.76* 8.75*  CALCIUM 8.9 8.7*    Liver Function Tests: No results for input(s): AST, ALT, ALKPHOS, BILITOT, PROT, ALBUMIN in the last 168 hours. No results for input(s): LIPASE, AMYLASE in the last 168 hours. No results for input(s): AMMONIA in the last 168 hours.  CBC:  Recent Labs Lab 07/03/16 1735 07/03/16 2327 07/04/16 0505  WBC 12.1* 10.7*  --   NEUTROABS 9.6*  --   --   HGB 8.9* 7.4* 7.5*  HCT 27.9* 23.9*  --   MCV 87.9 85.6  --    PLT 264 263  --     Cardiac Enzymes:  Recent Labs Lab 07/03/16 1735 07/03/16 2327  TROPONINI 0.12* 0.12*    BNP: Invalid input(s): POCBNP  CBG: No results for input(s): GLUCAP in the last 168 hours.  Microbiology: Results for orders placed or performed during the hospital encounter of 07/03/16  MRSA PCR Screening     Status: None   Collection Time: 07/03/16 10:33 PM  Result Value Ref Range Status   MRSA by PCR NEGATIVE NEGATIVE Final    Comment:        The GeneXpert MRSA Assay (FDA approved for NASAL specimens only), is one component of a comprehensive MRSA colonization surveillance program. It is not intended to diagnose MRSA infection nor to guide or monitor treatment for MRSA infections.     Coagulation Studies: No results for input(s): LABPROT, INR in the last 72 hours.  Urinalysis: No results for input(s): COLORURINE, LABSPEC, PHURINE, GLUCOSEU, HGBUR, BILIRUBINUR, KETONESUR, PROTEINUR, UROBILINOGEN, NITRITE, LEUKOCYTESUR in the last 72 hours.  Invalid input(s): APPERANCEUR    Imaging: Dg Chest 2 View  Result Date: 07/03/2016 CLINICAL DATA:  Shortness of breath EXAM: CHEST  2 VIEW COMPARISON:  None. FINDINGS: Cardiomediastinal silhouette is enlarged. There is calcific aortic atherosclerosis. No focal airspace consolidation or pulmonary edema. No pneumothorax or pleural effusion. IMPRESSION: Unchanged cardiomegaly and aortic atherosclerosis  without overt pulmonary edema. Electronically Signed   By: Ulyses Jarred M.D.   On: 07/03/2016 17:57   US Paracentesis  Result Date: 07/04/2016 INDICATION: Patient with a history of alcoholic cirrhosis with recurrent abdominal ascites. Request is made for therapeutic paracentesis with a 2 L maximum. EXAM: ULTRASOUND GUIDED THERAPEUTIC PARACENTESIS MEDICATIONS: 1% lidocaine COMPLICATIONS: None immediate. PROCEDURE: Informed written consent was obtained from the patient after a discussion of the risks, benefits and  alternatives to treatment. A timeout was performed prior to the initiation of the procedure. Initial ultrasound scanning demonstrates a moderate amount of ascites within the left lower abdominal quadrant. The left lower abdomen was prepped and draped in the usual sterile fashion. 1% lidocaine was used for local anesthesia. Following this, a Safe-T-Centesis catheter was introduced. An ultrasound image was saved for documentation purposes. The paracentesis was performed. The catheter was removed and a dressing was applied. The patient tolerated the procedure well without immediate post procedural complication. FINDINGS: A total of approximately 2 L of amber fluid was removed. IMPRESSION: Successful ultrasound-guided paracentesis yielding 2 liters of peritoneal fluid. Read by: Saverio Danker, PA-C Electronically Signed   By: Lucrezia Europe M.D.   On: 07/04/2016 15:12     Medications:   . sodium chloride    . sodium chloride    . sodium chloride     . amLODipine  5 mg Oral Daily  . aspirin  325 mg Oral Daily  . atorvastatin  40 mg Oral Daily  . azithromycin  500 mg Oral Daily  . famotidine  20 mg Oral Daily  . furosemide  80 mg Oral Daily  . gabapentin  300 mg Oral QHS  . heparin  5,000 Units Subcutaneous Q8H  . ipratropium-albuterol  3 mL Nebulization Q6H  . labetalol  100 mg Oral Daily  . methylPREDNISolone (SOLU-MEDROL) injection  40 mg Intravenous Q24H  . mometasone-formoterol  2 puff Inhalation BID  . nicotine  14 mg Transdermal Daily  . pantoprazole  40 mg Oral Daily  . sevelamer carbonate  2,400 mg Oral TID WC  . sodium chloride flush  3 mL Intravenous Q12H   sodium chloride, sodium chloride, sodium chloride, acetaminophen **OR** acetaminophen, albuterol, alteplase, alum & mag hydroxide-simeth, guaiFENesin, heparin, HYDROcodone-acetaminophen, hydrOXYzine, lidocaine (PF), lidocaine-prilocaine, nitroGLYCERIN, ondansetron **OR** ondansetron (ZOFRAN) IV, pentafluoroprop-tetrafluoroeth,  senna-docusate, sodium chloride flush  Assessment/ Plan:  56 y.o. male with ESRD on hemodialysis, hypertension, peripheral vascular disease, coronary artery disease, cirrhosis, substance abuse, recurrent GI bleed, admission for SOB 07/03/16.  TTS [facility] Intercourse Right arm AVF   1. End stage renal disease: TTS. Patient due for hemodialysis today. Orders have been prepared. Continue dialysis on a TTS schedule.  2. Hypertension: Blood pressure currently well controlled at 137/75. Continue current dosage of labetalol.  3. Anemia of chronic kidney disease with recent GI blood loss:  Hemoglobin currently stable at 7.5. Continue to monitor. We will start the patient on Epogen 14,000 units IV with dialysis.  4. Secondary Hyperparathyroidism:  - check phosphorus with dialysis today. Otherwise continue current dosage of Renvela.   LOS: 2 Alysia Scism 4/24/201812:09 PM

## 2016-07-05 NOTE — Progress Notes (Signed)
Pre hd 

## 2016-07-05 NOTE — Procedures (Signed)
Pt requested to end tx with 54 min left, educated on hd compliance, verbalized understanding, however, did not change his decision to complete tx. Repoorted off to Leonard, Therapist, sports.

## 2016-07-06 ENCOUNTER — Encounter: Payer: Self-pay | Admitting: Emergency Medicine

## 2016-07-06 ENCOUNTER — Emergency Department
Admission: EM | Admit: 2016-07-06 | Discharge: 2016-07-07 | Disposition: A | Discharge status: Home / Self Care | Payer: Medicare Other | Attending: Emergency Medicine | Admitting: Emergency Medicine

## 2016-07-06 ENCOUNTER — Emergency Department: Payer: Medicare Other

## 2016-07-06 DIAGNOSIS — F17210 Nicotine dependence, cigarettes, uncomplicated: Secondary | ICD-10-CM | POA: Insufficient documentation

## 2016-07-06 DIAGNOSIS — N186 End stage renal disease: Secondary | ICD-10-CM | POA: Diagnosis not present

## 2016-07-06 DIAGNOSIS — J441 Chronic obstructive pulmonary disease with (acute) exacerbation: Secondary | ICD-10-CM | POA: Diagnosis not present

## 2016-07-06 DIAGNOSIS — I120 Hypertensive chronic kidney disease with stage 5 chronic kidney disease or end stage renal disease: Secondary | ICD-10-CM | POA: Insufficient documentation

## 2016-07-06 DIAGNOSIS — I2510 Atherosclerotic heart disease of native coronary artery without angina pectoris: Secondary | ICD-10-CM | POA: Diagnosis not present

## 2016-07-06 DIAGNOSIS — Z992 Dependence on renal dialysis: Secondary | ICD-10-CM | POA: Diagnosis not present

## 2016-07-06 DIAGNOSIS — Z79899 Other long term (current) drug therapy: Secondary | ICD-10-CM | POA: Insufficient documentation

## 2016-07-06 DIAGNOSIS — R0602 Shortness of breath: Secondary | ICD-10-CM | POA: Diagnosis not present

## 2016-07-06 LAB — PARATHYROID HORMONE, INTACT (NO CA): PTH: 292 pg/mL — ABNORMAL HIGH (ref 15–65)

## 2016-07-06 MED ORDER — IPRATROPIUM-ALBUTEROL 0.5-2.5 (3) MG/3ML IN SOLN
3.0000 mL | Freq: Once | RESPIRATORY_TRACT | Status: AC
  Administered 2016-07-07: 3 mL via RESPIRATORY_TRACT
  Filled 2016-07-06 (×4): qty 3

## 2016-07-06 MED ORDER — AZITHROMYCIN 250 MG PO TABS
250.0000 mg | ORAL_TABLET | Freq: Once | ORAL | Status: AC
  Administered 2016-07-06: 250 mg via ORAL
  Filled 2016-07-06 (×2): qty 1

## 2016-07-06 MED ORDER — METHYLPREDNISOLONE SODIUM SUCC 125 MG IJ SOLR
125.0000 mg | Freq: Once | INTRAMUSCULAR | Status: AC
  Administered 2016-07-06: 125 mg via INTRAVENOUS
  Filled 2016-07-06 (×2): qty 2

## 2016-07-06 MED ORDER — IPRATROPIUM BROMIDE 0.02 % IN SOLN
RESPIRATORY_TRACT | Status: AC
  Filled 2016-07-06: qty 5

## 2016-07-06 MED ORDER — IPRATROPIUM-ALBUTEROL 0.5-2.5 (3) MG/3ML IN SOLN
3.0000 mL | Freq: Once | RESPIRATORY_TRACT | Status: AC
  Administered 2016-07-06: 3 mL via RESPIRATORY_TRACT
  Filled 2016-07-06: qty 3

## 2016-07-06 MED ORDER — IPRATROPIUM-ALBUTEROL 0.5-2.5 (3) MG/3ML IN SOLN
3.0000 mL | Freq: Once | RESPIRATORY_TRACT | Status: AC
  Administered 2016-07-07: 3 mL via RESPIRATORY_TRACT
  Filled 2016-07-06 (×3): qty 3

## 2016-07-06 NOTE — ED Triage Notes (Signed)
Patient comes in from home via [facility] with shortness of breath. Patient was discharged yesterday but left before he could get his prescriptions. Patient did have dialysis yesterday. Coughing up brown phlegm. Per EMS patient was agitated on the way here. Per patient only "morphineis the only thing that helps my breathing."

## 2016-07-06 NOTE — ED Provider Notes (Signed)
[facility] Emergency Department Provider Note  ____________________________________________  Time seen: Approximately 11:28 PM  I have reviewed the triage vital signs and the nursing notes.   HISTORY  Chief Complaint Shortness of Breath   HPI Stanley [name] is a 56 y.o. male with a history of alcohol cirrhosis, ESRD on HD (TTS), hypertension, hyperlipidemia, COPD, CAD status post bypass, diabetes who presents for evaluation of shortness of breath. Patient was discharged from the hospital yesterday after being admitted for COPD exacerbation. Patient left the hospital yesterday without waiting for his prescriptions. He denies having any inhalers at home. He is also not taking the steroids and antibiotics that he was prescribed. He reports that since gone home his been feeling progressively worsening short of breath, continues to have a cough productive of brownish sputum and has had significant wheezing. Patient reports that he can barely ambulate without feeling extremely short of breath. He lives at home by himself and does not have a car. He relies on his sisters to get his prescriptions for him. He tells me that his sisters are evil and will not go get his prescriptions.He denies fever or chills, chest pain, nausea, vomiting, diarrhea. He denies drinking alcohol since being discharged. Last HD was yesterday.   Past Medical History:  Diagnosis Date  . Alcohol abuse   . Cirrhosis ([place])   . Coronary artery disease 2009  . Diabetic peripheral neuropathy ([place])   . Drug abuse   . End stage renal disease on dialysis [facility]) NEPHROLOGIST-   [facility]  IN [place]   HEMODIALYSIS --   TUES/  THURS/  SAT  . GERD (gastroesophageal reflux disease)   . Hyperlipidemia   . Hypertension   . PAD (peripheral artery disease) ([place])   . Renal insufficiency    Per pt, 32 oz fluid restriction per day  . S/P triple vessel bypass 06/09/2016   2009ish  . Suicidal ideation    & HOMICIDAL IDEATION --  06-16-2013   ADMITTED TO BEHAVIOR HEALTH    Patient Active Problem List   Diagnosis Date Noted  . Acute respiratory failure with hypoxia ([place]) 07/03/2016  . GI bleeding 06/24/2016  . Rectal bleeding 06/14/2016  . Anemia of chronic disease 06/01/2016  . MRSA carrier 06/01/2016  . Chronic renal failure 05/23/2016  . Ischemic heart disease 05/23/2016  . Angiodysplasia of small intestine   . Melena   . Small bowel bleed not requiring more than 4 units of blood in 24 hours, ICU, or surgery   . Anemia due to chronic blood loss   . Abdominal pain 05/05/2016  . Acute posthemorrhagic anemia 04/17/2016  . Gastrointestinal bleed 04/17/2016  . History of esophagogastroduodenoscopy (EGD) 04/17/2016  . Elevated troponin 04/17/2016  . Alcohol abuse 04/17/2016  . Upper GI bleed 01/19/2016  . Blood in stool   . Angiodysplasia of stomach and duodenum with hemorrhage   . Gastritis   . Esophagitis, unspecified   . GI bleed 05/16/2015  . Acute GI bleeding   . Symptomatic anemia 04/30/2015  . HTN (hypertension) 04/06/2015  . GERD (gastroesophageal reflux disease) 04/06/2015  . HLD (hyperlipidemia) 04/06/2015  . Dyspnea 04/06/2015  . Cirrhosis of liver with ascites ([place]) 04/06/2015  . Ascites 04/06/2015  . GIB (gastrointestinal bleeding) 03/23/2015  . Homicidal ideation 06/19/2013  . Suicidal intent 06/19/2013  . Homicidal ideations 06/19/2013  . Hyperkalemia 06/16/2013  . Mandible fracture ([place]) 06/05/2013  . Fracture, mandible ([place]) 06/02/2013  . Coronary atherosclerosis of native  coronary artery 06/02/2013  . ESRD on dialysis ([place]) 06/02/2013  . Mandible open fracture ([place]) 06/02/2013    Past Surgical History:  Procedure Laterality Date  . AGILE CAPSULE N/A 06/19/2016   Procedure: AGILE CAPSULE;  Surgeon: [name], MD;  [facility] END[facility]COPY;  Service: Endoscopy;  Laterality: N/A;  . COLONOSCOPY WITH PROPOFOL N/A 06/18/2016   Procedure: COLONOSCOPY WITH  PROPOFOL;  Surgeon: [name], MD;  Location: [facility] ENDOSCOPY;  Service: Endoscopy;  Laterality: N/A;  . CORONARY ANGIOPLASTY  ?   PT UNABLE TO TELL IF  BEFORE OR AFTER  CABG  . CORONARY ARTERY BYPASS GRAFT  2008  ([place] , [place])   3 VESSEL  . DIALYSIS FISTULA CREATION  LAST SURGERY  APPOX  2008  . ENTEROSCOPY N/A 05/10/2016   Procedure: ENTEROSCOPY;  Surgeon: Jerene Bears, MD;  Location: [facility];  Service: Gastroenterology;  Laterality: N/A;  . ESOPHAGOGASTRODUODENOSCOPY N/A 05/07/2015   Procedure: ESOPHAGOGASTRODUODENOSCOPY (EGD);  Surgeon: Hulen Luster, MD;  Location: [facility] ENDOSCOPY;  Service: Endoscopy;  Laterality: N/A;  . ESOPHAGOGASTRODUODENOSCOPY (EGD) WITH PROPOFOL N/A 05/17/2015   Procedure: ESOPHAGOGASTRODUODENOSCOPY (EGD) WITH PROPOFOL;  Surgeon: [name], MD;  Location: [facility] ENDOSCOPY;  Service: Endoscopy;  Laterality: N/A;  . ESOPHAGOGASTRODUODENOSCOPY (EGD) WITH PROPOFOL N/A 01/20/2016   Procedure: ESOPHAGOGASTRODUODENOSCOPY (EGD) WITH PROPOFOL;  Surgeon: [name], MD;  Location: [facility] ENDOSCOPY;  Service: Endoscopy;  Laterality: N/A;  . ESOPHAGOGASTRODUODENOSCOPY (EGD) WITH PROPOFOL N/A 04/17/2016   Procedure: ESOPHAGOGASTRODUODENOSCOPY (EGD) WITH PROPOFOL;  Surgeon: Lin Landsman, MD;  Location: [facility] ENDOSCOPY;  Service: Gastroenterology;  Laterality: N/A;  . ESOPHAGOGASTRODUODENOSCOPY (EGD) WITH PROPOFOL  05/09/2016   Procedure: ESOPHAGOGASTRODUODENOSCOPY (EGD) WITH PROPOFOL;  Surgeon: Jerene Bears, MD;  Location: [place];  Service: Endoscopy;;  . ESOPHAGOGASTRODUODENOSCOPY (EGD) WITH PROPOFOL N/A 06/16/2016   Procedure: ESOPHAGOGASTRODUODENOSCOPY (EGD) WITH PROPOFOL;  Surgeon: [name], MD;  Location: [facility] ENDOSCOPY;  Service: Endoscopy;  Laterality: N/A;  . GIVENS CAPSULE STUDY N/A 05/07/2016   Procedure: GIVENS CAPSULE STUDY;  Surgeon: Doran Stabler, MD;  Location: [place];  Service: Endoscopy;  Laterality: N/A;  . MANDIBULAR HARDWARE REMOVAL N/A 07/29/2013     Procedure: REMOVAL OF ARCH BARS;  Surgeon: Theodoro Kos, DO;  Location: [place];  Service: Plastics;  Laterality: N/A;  . ORIF MANDIBULAR FRACTURE N/A 06/05/2013   Procedure: REPAIR OF MANDIBULAR FRACTURE x 2 with maxillo-mandibular fixation ;  Surgeon: Theodoro Kos, DO;  Location: [place];  Service: Plastics;  Laterality: N/A;  . PERIPHERAL ARTERIAL STENT GRAFT Left     Prior to Admission medications   Medication Sig Start Date End Date Taking? Authorizing Provider  albuterol (PROVENTIL HFA;VENTOLIN HFA) 108 (90 Base) MCG/ACT inhaler Inhale 2 puffs into the lungs every 6 (six) hours as needed for wheezing or shortness of breath. 07/07/16   Rudene Re, MD  amLODipine (NORVASC) 5 MG tablet Take 5 mg by mouth daily.    Historical Provider, MD  atorvastatin (LIPITOR) 40 MG tablet Take 40 mg by mouth daily. 04/19/16   Historical Provider, MD  azithromycin (ZITHROMAX) 250 MG tablet Take 1 a day for 4 days 07/07/16   Rudene Re, MD  budesonide-formoterol [facility]) 160-4.5 MCG/ACT inhaler Inhale 2 puffs into the lungs daily.    Historical Provider, MD  furosemide (LASIX) 80 MG tablet Take 80 mg by mouth daily. 05/11/16   Historical Provider, MD  gabapentin (NEURONTIN) 300 MG capsule Take 1 capsule (300 mg total) by mouth daily. Patient taking differently: Take 300 mg  by mouth at bedtime.  03/27/15   Bettey Costa, MD  hydrOXYzine (VISTARIL) 25 MG capsule Take 1 capsule (25 mg total) by mouth 3 (three) times daily as needed for itching. Patient not taking: Reported on 07/04/2016 02/25/16   Jami L Hagler, PA-C  labetalol (NORMODYNE) 100 MG tablet Take 1 tablet (100 mg total) by mouth daily. Patient not taking: Reported on 07/04/2016 01/22/16   Epifanio Lesches, MD  Multiple Vitamins-Minerals-FA (DIALYVITE SUPREME D) 3 MG TABS Take 1 tablet by mouth daily. 05/27/16   Historical Provider, MD  mupirocin ointment (BACTROBAN) 2 % Place 1 application into the nose 2 (two) times  daily. 06/01/16   Theodoro Grist, MD  nitroGLYCERIN (NITROSTAT) 0.4 MG SL tablet Place 1 tablet (0.4 mg total) under the tongue every 5 (five) minutes as needed. Patient taking differently: Place 0.4 mg under the tongue every 5 (five) minutes as needed for chest pain.  04/12/16   Wende Bushy, MD  oxyCODONE-acetaminophen (ROXICET) 5-325 MG tablet Take 1-2 tablets by mouth every 4 (four) hours as needed for severe pain. Patient not taking: Reported on 07/04/2016 04/21/16   Hinda Kehr, MD  pantoprazole (PROTONIX) 40 MG tablet Take 1 tablet (40 mg total) by mouth daily. 05/10/16   Shanker Kristeen Mans, MD  predniSONE (DELTASONE) 20 MG tablet Take 3 tablets (60 mg total) by mouth daily. 07/07/16 07/11/16  Rudene Re, MD  sevelamer carbonate (RENVELA) 800 MG tablet Take 3 tablets (2,400 mg total) by mouth 3 (three) times daily with meals. 06/01/16   Theodoro Grist, MD    Allergies Patient has no known allergies.  Family History  Problem Relation Age of Onset  . Colon cancer Mother   . Cancer Father   . Cancer Sister     Social History Social History  Substance Use Topics  . Smoking status: Current Every Day Smoker    Packs/day: 0.15    Years: 40.00    Types: Cigarettes  . Smokeless tobacco: Never Used  . Alcohol use No     Comment: pt reports quitting after learning about cirrhosis    Review of Systems  Constitutional: Negative for fever. Eyes: Negative for visual changes. ENT: Negative for sore throat. Neck: No neck pain  Cardiovascular: Negative for chest pain. Respiratory: + shortness of breath, cough, and wheezing Gastrointestinal: Negative for abdominal pain, vomiting or diarrhea. Genitourinary: Negative for dysuria. Musculoskeletal: Negative for back pain. Skin: Negative for rash. Neurological: Negative for headaches, weakness or numbness. Psych: No SI or HI  ____________________________________________   PHYSICAL EXAM:  VITAL SIGNS: ED Triage Vitals  Enc Vitals  Group     BP 07/06/16 2314 116/79     Pulse Rate 07/06/16 2314 78     Resp 07/06/16 2314 14     Temp --      Temp src --      SpO2 07/06/16 2313 100 %     Weight 07/06/16 2315 183 lb (83 kg)     Height 07/06/16 2315 6\' 3"  (1.905 m)     Head Circumference --      Peak Flow --      Pain Score --      Pain Loc --      Pain Edu? --      Excl. in [place]? --     Constitutional: Alert and oriented. Well appearing and in no apparent distress. HEENT:      Head: Normocephalic and atraumatic.         Eyes: Conjunctivae  are normal. Sclera is non-icteric. EOMI. PERRL      Mouth/Throat: Mucous membranes are moist.       Neck: Supple with no signs of meningismus. Cardiovascular: Regular rate and rhythm. No murmurs, gallops, or rubs. 2+ symmetrical distal pulses are present in all extremities. No JVD. Respiratory: Normal respiratory effort. Normal sides, diffuse expiratory wheezes throughout Gastrointestinal: Soft, distended, non tender with positive bowel sounds. No rebound or guarding. Genitourinary: No CVA tenderness. Musculoskeletal: Nontender with normal range of motion in all extremities. No edema, cyanosis, or erythema of extremities. Neurologic: Normal speech and language. Face is symmetric. Moving all extremities. No gross focal neurologic deficits are appreciated. Skin: Skin is warm, dry and intact. No rash noted. Psychiatric: Mood and affect are normal. Speech and behavior are normal.  ____________________________________________   LABS (all labs ordered are listed, but only abnormal results are displayed)  Labs Reviewed  CBC WITH DIFFERENTIAL/PLATELET - Abnormal; Notable for the following:       Result Value   RBC 2.79 (*)    Hemoglobin 7.6 (*)    HCT 23.8 (*)    MCHC 31.7 (*)    RDW 17.0 (*)    Lymphs Abs 0.8 (*)    All other components within normal limits  COMPREHENSIVE METABOLIC PANEL - Abnormal; Notable for the following:    Chloride 99 (*)    Glucose, Bld 110 (*)     BUN 59 (*)    Creatinine, Ser 8.84 (*)    Calcium 8.5 (*)    Albumin 2.9 (*)    GFR calc non Af Amer 6 (*)    GFR calc Af Amer 7 (*)    All other components within normal limits  TROPONIN I - Abnormal; Notable for the following:    Troponin I 0.16 (*)    All other components within normal limits   ____________________________________________  EKG  ED ECG REPORT I, Rudene Re, the attending physician, personally viewed and interpreted this ECG.  Normal sinus rhythm, rate of 77, normal intervals, right axis deviation, T-wave inversions in inferior leads, no ST elevations or depressions. Unchanged from prior ____________________________________________  RADIOLOGY  CXR:  Mild vascular congestion noted. Mildly increased interstitial markings may reflect minimal interstitial edema. ____________________________________________   PROCEDURES  Procedure(s) performed: None Procedures Critical Care performed:  None ____________________________________________   INITIAL IMPRESSION / ASSESSMENT AND PLAN / ED COURSE  56 y.o. male with a history of alcohol cirrhosis, ESRD on HD (TTS), hypertension, hyperlipidemia, COPD, CAD status post bypass, diabetes who presents for evaluation of shortness of breath, cough, and wheezing in the setting of recent admission to the hospital and discharged home yesterday and not using his prescribed inhalers, antibiotics, and steroids since gone home. Patient with diffuse expiratory wheezes throughout, no oxygen requirement and satting 100% on room air. We'll give doing labs, continue the steroids and azithromycin. We'll check x-ray and basic blood work.  Clinical Course as of Jul 08 231  Thu Jul 07, 2016  0139 Blood work were no acute changes, stable anemia, troponin within patient's baseline. No indication for emergent dialysis. Chest x-ray with mild pulmonary edema. Patient feels improved after 3 [place] abs. We ambulated and his sats were 100%.  Patient's sister cannot, get him until the morning and she will taken to dialysis. We'll also provide patient with prescriptions for azithromycin, prednisone, and albuterol. Patient will be discharged at this time and wait for sister in the ER  [CV]    Clinical Course User Index [CV]  Rudene Re, MD    Pertinent labs & imaging results that were available during my care of the patient were reviewed by me and considered in my medical decision making (see chart for details).    ____________________________________________   FINAL CLINICAL IMPRESSION(S) / ED DIAGNOSES  Final diagnoses:  COPD exacerbation ([place])      NEW MEDICATIONS STARTED DURING THIS VISIT:  Discharge Medication List as of 07/07/2016  1:39 AM    START taking these medications   Details  albuterol (PROVENTIL HFA;VENTOLIN HFA) 108 (90 Base) MCG/ACT inhaler Inhale 2 puffs into the lungs every 6 (six) hours as needed for wheezing or shortness of breath., Starting Thu 07/07/2016, Print    azithromycin (ZITHROMAX) 250 MG tablet Take 1 a day for 4 days, Print    predniSONE (DELTASONE) 20 MG tablet Take 3 tablets (60 mg total) by mouth daily., Starting Thu 07/07/2016, Until Mon 07/11/2016, Print         Note:  This document was prepared using Dragon voice recognition software and may include unintentional dictation errors.   Rudene Re, MD 07/07/16 [phone]

## 2016-07-06 NOTE — ED Notes (Signed)
Patient transported to X-ray 

## 2016-07-07 ENCOUNTER — Other Ambulatory Visit: Payer: Self-pay

## 2016-07-07 DIAGNOSIS — J441 Chronic obstructive pulmonary disease with (acute) exacerbation: Secondary | ICD-10-CM | POA: Diagnosis not present

## 2016-07-07 DIAGNOSIS — N186 End stage renal disease: Secondary | ICD-10-CM | POA: Diagnosis not present

## 2016-07-07 DIAGNOSIS — Z23 Encounter for immunization: Secondary | ICD-10-CM | POA: Diagnosis not present

## 2016-07-07 LAB — COMPREHENSIVE METABOLIC PANEL
ALT: 23 U/L (ref 17–63)
AST: 32 U/L (ref 15–41)
Albumin: 2.9 g/dL — ABNORMAL LOW (ref 3.5–5.0)
Alkaline Phosphatase: 106 U/L (ref 38–126)
Anion gap: 14 (ref 5–15)
BILIRUBIN TOTAL: 0.5 mg/dL (ref 0.3–1.2)
BUN: 59 mg/dL — ABNORMAL HIGH (ref 6–20)
CO2: 27 mmol/L (ref 22–32)
Calcium: 8.5 mg/dL — ABNORMAL LOW (ref 8.9–10.3)
Chloride: 99 mmol/L — ABNORMAL LOW (ref 101–111)
Creatinine, Ser: 8.84 mg/dL — ABNORMAL HIGH (ref 0.61–1.24)
GFR calc Af Amer: 7 mL/min — ABNORMAL LOW (ref >60)
GFR, EST NON AFRICAN AMERICAN: 6 mL/min — AB (ref >60)
Glucose, Bld: 110 mg/dL — ABNORMAL HIGH (ref 65–99)
POTASSIUM: 4.8 mmol/L (ref 3.5–5.1)
Sodium: 140 mmol/L (ref 135–145)
TOTAL PROTEIN: 6.6 g/dL (ref 6.5–8.1)

## 2016-07-07 LAB — CBC WITH DIFFERENTIAL/PLATELET
BASOS PCT: 1 %
Basophils Absolute: 0.1 10*3/uL (ref 0–0.1)
EOS ABS: 0 10*3/uL (ref 0–0.7)
EOS PCT: 0 %
HEMATOCRIT: 23.8 % — AB (ref 40.0–52.0)
Hemoglobin: 7.6 g/dL — ABNORMAL LOW (ref 13.0–18.0)
LYMPHS ABS: 0.8 10*3/uL — AB (ref 1.0–3.6)
Lymphocytes Relative: 11 %
MCH: 27.1 pg (ref 26.0–34.0)
MCHC: 31.7 g/dL — AB (ref 32.0–36.0)
MCV: 85.4 fL (ref 80.0–100.0)
MONO ABS: 0.8 10*3/uL (ref 0.2–1.0)
Monocytes Relative: 10 %
Neutro Abs: 5.9 10*3/uL (ref 1.4–6.5)
Neutrophils Relative %: 78 %
Platelets: 280 10*3/uL (ref 150–440)
RBC: 2.79 MIL/uL — AB (ref 4.40–5.90)
RDW: 17 % — AB (ref 11.5–14.5)
WBC: 7.6 10*3/uL (ref 3.8–10.6)

## 2016-07-07 LAB — TROPONIN I: TROPONIN I: 0.16 ng/mL — AB (ref <0.03)

## 2016-07-07 MED ORDER — AZITHROMYCIN 250 MG PO TABS: ORAL_TABLET | ORAL | 0 refills | Status: DC

## 2016-07-07 MED ORDER — PREDNISONE 20 MG PO TABS: 60.0000 mg | ORAL_TABLET | Freq: Every day | ORAL | 0 refills | Status: DC

## 2016-07-07 MED ORDER — ALBUTEROL SULFATE HFA 108 (90 BASE) MCG/ACT IN AERS: 2.0000 | INHALATION_SPRAY | Freq: Four times a day (QID) | RESPIRATORY_TRACT | 2 refills | Status: DC | PRN

## 2016-07-07 MED ORDER — IPRATROPIUM-ALBUTEROL 0.5-2.5 (3) MG/3ML IN SOLN
RESPIRATORY_TRACT | Status: AC
  Filled 2016-07-07: qty 6

## 2016-07-07 MED ORDER — HYDROCOD POLST-CPM POLST ER 10-8 MG/5ML PO SUER
5.0000 mL | Freq: Once | ORAL | Status: AC
  Administered 2016-07-07: 5 mL via ORAL
  Filled 2016-07-07: qty 5

## 2016-07-07 NOTE — Discharge Instructions (Signed)
You were seen today in the Emergency Department (ED) and was diagnosed with a COPD exacerbation. Chronic obstructive pulmonary disease (COPD) is a general term for a group of lung diseases, including emphysema and chronic bronchitis. People with COPD have decreased airflow in and out of the lungs, which makes it hard to breathe. The airways also can get clogged with thick mucus. Cigarette smoking is a major cause of COPD.  ° °Although there is no cure for COPD, you can slow its progress. Following your treatment plan and taking care of yourself can help you feel better and live longer.  ° °Use your albuterol inhaler 2 puffs every 4 hour as needed for shortness of breath, wheezing, or cough. Take steroids as prescribed. Take antibiotics as prescribed. ° °Follow-up with your doctor in 1 day for re-evaluation.  ° °When should you call for help?  °Call 911 anytime you think you may need emergency care. For example, call if:  °You have severe trouble breathing.  °You have severe chest pain. °Call your doctor now or seek immediate medical care if:  °You have new or worse shortness of breath.  °You develop new chest pain.  °You are coughing more deeply or more often, especially if you notice more mucus or a change in the color of your mucus.  °You cough up blood.  °You have new or increased swelling in your legs or belly.  °You have a fever. °Watch closely for changes in your health, and be sure to contact your doctor if:  °You use your antibiotic prescription.  °Your symptoms are getting worse ° ° °How can you care for yourself at home?  ° °During an exacerbation  °Do not panic if you start to have one. Quick treatment at home may help you prevent serious breathing problems. If you have a COPD exacerbation plan that you developed with your doctor, follow it.  °Take your medicines exactly as your doctor tells you.  °Use your inhaler as directed by your doctor. If your symptoms do not get better after you use your medicine,  have someone take you to the emergency room. Call an ambulance if necessary.  °With inhaled medicines, a spacer or a nebulizer may help you get more medicine to your lungs. Ask your doctor or pharmacist how to use them properly. Practice using the spacer in front of a mirror before you have an exacerbation. This may help you get the medicine into your lungs quickly.  °If your doctor has given you steroid pills, take them as directed.  °Your doctor may have given you a prescription for antibiotics, which you are to fill if you need it. Call your doctor if you use the prescription.  °Talk to your doctor if you have any problems with your medicine. ° °Preventing an exacerbation  °Do not smoke. This is the most important step you can take to prevent more damage to your lungs and prevent problems. If you already smoke, it is never too late to stop. If you need help quitting, talk to your doctor about stop-smoking programs and medicines. These can increase your chances of quitting for good.  °Take your daily medicines as prescribed.  °Avoid colds and flu.  °Get a pneumococcal vaccine.  °Get a flu vaccine each year, as soon as it is available. Ask those you live or work with to do the same, so they will not get the flu and infect you.  °Try to stay away from people with colds or the flu.  °  Wash your hands often. °Avoid secondhand smoke; air pollution; cold, dry air; hot, humid air; and high altitudes. Stay at home with your windows closed when air pollution is bad.  °Learn breathing techniques for COPD, such as breathing through pursed lips. These techniques can help you breathe easier during an exacerbation. ° °Staying healthy  °Do not smoke. This is the most important step you can take to prevent more damage to your lungs. If you need help quitting, talk to your doctor about stop-smoking programs and medicines. These can increase your chances of quitting for good.  °Avoid colds and flu. Get a pneumococcal vaccine shot.  If you have had one before, ask your doctor whether you need a second dose. Get the flu vaccine every fall. If you must be around people with colds or the flu, wash your hands often.  °Avoid secondhand smoke, air pollution, and high altitudes. Also avoid cold, dry air and hot, humid air. Stay at home with your windows closed when air pollution is bad. ° °Medicines and oxygen therapy  °Take your medicines exactly as prescribed. Call your doctor if you think you are having a problem with your medicine.  °You may be taking medicines such as:  °Bronchodilators. These help open your airways and make breathing easier. Bronchodilators are either short-acting (work for 6 to 9 hours) or long-acting (work for 24 hours). You inhale most bronchodilators, so they start to act quickly. Always carry your quick-relief inhaler with you in case you need it while you are away from home.  °Corticosteroids (prednisone, budesonide). These reduce airway inflammation. They come in pill or inhaled form. You must take these medicines every day for them to work well. °A spacer may help you get more inhaled medicine to your lungs. Ask your doctor or pharmacist if a spacer is right for you. If it is, ask how to use it properly.  °Do not take any vitamins, over-the-counter medicine, or herbal products without talking to your doctor first.  °If your doctor prescribed antibiotics, take them as directed. Do not stop taking them just because you feel better. You need to take the full course of antibiotics.  °Oxygen therapy boosts the amount of oxygen in your blood and helps you breathe easier. Use the flow rate your doctor has recommended, and do not change it without talking to your doctor first. ° °Activity  °Get regular exercise. Walking is an easy way to get exercise. Start out slowly, and walk a little more each day.  °Pay attention to your breathing. You are exercising too hard if you cannot talk while you are exercising.  °Take short rest  breaks when doing household chores and other activities.  °Learn breathing methods--such as breathing through pursed lips--to help you become less short of breath.  °If your doctor has not set you up with a pulmonary rehabilitation program, talk to him or her about whether rehab is right for you. Rehab includes exercise programs, education about your disease and how to manage it, help with diet and other changes, and emotional support. ° °Diet  °Eat regular, healthy meals. Use bronchodilators about 1 hour before you eat to make it easier to eat. Eat several small meals instead of three large ones. Drink beverages at the end of the meal. Avoid foods that are hard to chew.  °Eat foods that contain fat and protein so that you do not lose weight and muscle mass. These foods include ice cream, pudding, cheese, eggs, and peanut butter.  °  Use less salt. Too much salt can cause you to retain fluids, which makes it harder to breathe. Do not add salt while you are cooking or at the table. Eat fewer processed foods and foods from restaurants, including fast foods. Use fresh or frozen foods instead of canned foods. ° °Mental health  °Talk to your family, friends, or a therapist about your feelings. It is normal to feel frightened, angry, hopeless, helpless, and even guilty. Talking openly about bad feelings can help you cope. If these feelings last, talk to your doctor. ° ° ° ° °

## 2016-07-09 DIAGNOSIS — Z23 Encounter for immunization: Secondary | ICD-10-CM | POA: Diagnosis not present

## 2016-07-09 DIAGNOSIS — N186 End stage renal disease: Secondary | ICD-10-CM | POA: Diagnosis not present

## 2016-07-10 ENCOUNTER — Emergency Department
Admission: EM | Admit: 2016-07-10 | Discharge: 2016-07-12 | Disposition: A | Discharge status: Home / Self Care | Payer: Medicare Other | Attending: Emergency Medicine | Admitting: Emergency Medicine

## 2016-07-10 DIAGNOSIS — F17210 Nicotine dependence, cigarettes, uncomplicated: Secondary | ICD-10-CM | POA: Insufficient documentation

## 2016-07-10 DIAGNOSIS — R0602 Shortness of breath: Secondary | ICD-10-CM | POA: Diagnosis present

## 2016-07-10 DIAGNOSIS — I1311 Hypertensive heart and chronic kidney disease without heart failure, with stage 5 chronic kidney disease, or end stage renal disease: Secondary | ICD-10-CM | POA: Diagnosis not present

## 2016-07-10 DIAGNOSIS — Z79899 Other long term (current) drug therapy: Secondary | ICD-10-CM | POA: Insufficient documentation

## 2016-07-10 DIAGNOSIS — N186 End stage renal disease: Secondary | ICD-10-CM | POA: Insufficient documentation

## 2016-07-10 DIAGNOSIS — J441 Chronic obstructive pulmonary disease with (acute) exacerbation: Secondary | ICD-10-CM | POA: Diagnosis not present

## 2016-07-10 DIAGNOSIS — R070 Pain in throat: Secondary | ICD-10-CM | POA: Diagnosis not present

## 2016-07-10 DIAGNOSIS — I25810 Atherosclerosis of coronary artery bypass graft(s) without angina pectoris: Secondary | ICD-10-CM | POA: Diagnosis not present

## 2016-07-10 DIAGNOSIS — R05 Cough: Secondary | ICD-10-CM | POA: Diagnosis not present

## 2016-07-10 MED ORDER — ALBUTEROL SULFATE (2.5 MG/3ML) 0.083% IN NEBU: 5.0000 mg | INHALATION_SOLUTION | Freq: Once | RESPIRATORY_TRACT | Status: DC

## 2016-07-10 NOTE — ED Notes (Signed)
Patient to triage via EMS.  Reports cough and sore throat that inhalers not helping.  Patient given duoneb enroute.  Reports attempted IV without success.  HR - 106, BP 150/90, Pulse ox 99% on room air.

## 2016-07-10 NOTE — ED Triage Notes (Signed)
Patient reports recent diagnosis of COPD and that the inhalers are not working.  Also reports having a cough (reports productive with white/yellow sputum) and sore throat.  Patient with no acute respiratory distress noted.

## 2016-07-11 ENCOUNTER — Emergency Department: Payer: Medicare Other

## 2016-07-11 DIAGNOSIS — J441 Chronic obstructive pulmonary disease with (acute) exacerbation: Secondary | ICD-10-CM | POA: Diagnosis not present

## 2016-07-11 DIAGNOSIS — N186 End stage renal disease: Secondary | ICD-10-CM | POA: Diagnosis not present

## 2016-07-11 DIAGNOSIS — R05 Cough: Secondary | ICD-10-CM | POA: Diagnosis not present

## 2016-07-11 DIAGNOSIS — Z992 Dependence on renal dialysis: Secondary | ICD-10-CM | POA: Diagnosis not present

## 2016-07-11 MED ORDER — ALBUTEROL SULFATE HFA 108 (90 BASE) MCG/ACT IN AERS: INHALATION_SPRAY | RESPIRATORY_TRACT | 1 refills | Status: DC

## 2016-07-11 MED ORDER — ACETAMINOPHEN 325 MG PO TABS
650.0000 mg | ORAL_TABLET | Freq: Once | ORAL | Status: AC
  Administered 2016-07-11: 650 mg via ORAL
  Filled 2016-07-11: qty 2

## 2016-07-11 MED ORDER — IPRATROPIUM-ALBUTEROL 0.5-2.5 (3) MG/3ML IN SOLN
3.0000 mL | Freq: Once | RESPIRATORY_TRACT | Status: AC
  Administered 2016-07-11: 3 mL via RESPIRATORY_TRACT
  Filled 2016-07-11: qty 3

## 2016-07-11 MED ORDER — PREDNISONE 10 MG PO TABS: ORAL_TABLET | ORAL | 0 refills | Status: DC

## 2016-07-11 MED ORDER — PREDNISONE 20 MG PO TABS
60.0000 mg | ORAL_TABLET | ORAL | Status: AC
  Administered 2016-07-11: 60 mg via ORAL
  Filled 2016-07-11: qty 3

## 2016-07-11 NOTE — ED Provider Notes (Signed)
[facility] Emergency Department Provider Note  ____________________________________________   First MD Initiated Contact with Patient 07/11/16 0004     (approximate)  I have reviewed the triage vital signs and the nursing notes.   HISTORY  Chief Complaint Shortness of Breath; Cough; and Sore Throat    HPI Stanley [name] is a 56 y.o. male with extensive PMH as documented below including COPD and CAD s/p bypass and ESRD on HD who presents for evaluation of SOB.  He was last seen in the emergency department about 3-4 days ago.  He was discharged from the hospital for COPD exacerbation the day before that and the left without waiting for his prescriptions.  He admits to medication noncompliance recently although he states that he has been using his inhaler since coming to the emergency department the last time. He continues to feel short of breath, worse with ambulation.  Shortness of breath started weeks ago and only feels better when he is in the hospital.  Denies CP, N/V, fever/chills, abdominal pain.  He has been going to dialysis and is on a M-W-F schedule.  He has not had significant swelling in his legs.  Describes the symptoms as severe, made worse with exertion, better with rest.  He was breathing comfortably when I checked on him and sleeping with an oxygen saturation of 99% but when I woke him he states that he feels short of breath.   Past Medical History:  Diagnosis Date  . Alcohol abuse   . Cirrhosis ([place])   . Coronary artery disease 2009  . Diabetic peripheral neuropathy ([place])   . Drug abuse   . End stage renal disease on dialysis [facility]) NEPHROLOGIST-   DR [facility]  IN [place]   HEMODIALYSIS --   TUES/  THURS/  SAT  . GERD (gastroesophageal reflux disease)   . Hyperlipidemia   . Hypertension   . PAD (peripheral artery disease) ([place])   . Renal insufficiency    Per pt, 32 oz fluid restriction per day  . S/P triple vessel bypass 06/09/2016   2009ish  . Suicidal ideation    & HOMICIDAL IDEATION --  06-16-2013   ADMITTED TO BEHAVIOR HEALTH    Patient Active Problem List   Diagnosis Date Noted  . Acute respiratory failure with hypoxia ([place]) 07/03/2016  . GI bleeding 06/24/2016  . Rectal bleeding 06/14/2016  . Anemia of chronic disease 06/01/2016  . MRSA carrier 06/01/2016  . Chronic renal failure 05/23/2016  . Ischemic heart disease 05/23/2016  . Angiodysplasia of small intestine   . Melena   . Small bowel bleed not requiring more than 4 units of blood in 24 hours, ICU, or surgery   . Anemia due to chronic blood loss   . Abdominal pain 05/05/2016  . Acute posthemorrhagic anemia 04/17/2016  . Gastrointestinal bleed 04/17/2016  . History of esophagogastroduodenoscopy (EGD) 04/17/2016  . Elevated troponin 04/17/2016  . Alcohol abuse 04/17/2016  . Upper GI bleed 01/19/2016  . Blood in stool   . Angiodysplasia of stomach and duodenum with hemorrhage   . Gastritis   . Esophagitis, unspecified   . GI bleed 05/16/2015  . Acute GI bleeding   . Symptomatic anemia 04/30/2015  . HTN (hypertension) 04/06/2015  . GERD (gastroesophageal reflux disease) 04/06/2015  . HLD (hyperlipidemia) 04/06/2015  . Dyspnea 04/06/2015  . Cirrhosis of liver with ascites ([place]) 04/06/2015  . Ascites 04/06/2015  . GIB (gastrointestinal bleeding) 03/23/2015  . Homicidal ideation 06/19/2013  .  Suicidal intent 06/19/2013  . Homicidal ideations 06/19/2013  . Hyperkalemia 06/16/2013  . Mandible fracture ([place]) 06/05/2013  . Fracture, mandible ([place]) 06/02/2013  . Coronary atherosclerosis of native coronary artery 06/02/2013  . ESRD on dialysis ([place]) 06/02/2013  . Mandible open fracture ([place]) 06/02/2013    Past Surgical History:  Procedure Laterality Date  . AGILE CAPSULE N/A 06/19/2016   Procedure: AGILE CAPSULE;  Surgeon: [name], MD;  Location: [facility] ENDOSCOPY;  Service: Endoscopy;  Laterality: N/A;  . COLONOSCOPY WITH PROPOFOL N/A 06/18/2016     Procedure: COLONOSCOPY WITH PROPOFOL;  Surgeon: [name], MD;  Location: [facility] ENDOSCOPY;  Service: Endoscopy;  Laterality: N/A;  . CORONARY ANGIOPLASTY  ?   PT UNABLE TO TELL IF  BEFORE OR AFTER  CABG  . CORONARY ARTERY BYPASS GRAFT  2008  ([place] , [place])   3 VESSEL  . DIALYSIS FISTULA CREATION  LAST SURGERY  APPOX  2008  . ENTEROSCOPY N/A 05/10/2016   Procedure: ENTEROSCOPY;  Surgeon: Jerene Bears, MD;  Location: [place];  Service: Gastroenterology;  Laterality: N/A;  . ESOPHAGOGASTRODUODENOSCOPY N/A 05/07/2015   Procedure: ESOPHAGOGASTRODUODENOSCOPY (EGD);  Surgeon: Hulen Luster, MD;  Location: [facility] ENDOSCOPY;  Service: Endoscopy;  Laterality: N/A;  . ESOPHAGOGASTRODUODENOSCOPY (EGD) WITH PROPOFOL N/A 05/17/2015   Procedure: ESOPHAGOGASTRODUODENOSCOPY (EGD) WITH PROPOFOL;  Surgeon: [name], MD;  Location: [facility] ENDOSCOPY;  Service: Endoscopy;  Laterality: N/A;  . ESOPHAGOGASTRODUODENOSCOPY (EGD) WITH PROPOFOL N/A 01/20/2016   Procedure: ESOPHAGOGASTRODUODENOSCOPY (EGD) WITH PROPOFOL;  Surgeon: [name], MD;  Location: [facility] ENDOSCOPY;  Service: Endoscopy;  Laterality: N/A;  . ESOPHAGOGASTRODUODENOSCOPY (EGD) WITH PROPOFOL N/A 04/17/2016   Procedure: ESOPHAGOGASTRODUODENOSCOPY (EGD) WITH PROPOFOL;  Surgeon: Lin Landsman, MD;  Location: [facility] ENDOSCOPY;  Service: Gastroenterology;  Laterality: N/A;  . ESOPHAGOGASTRODUODENOSCOPY (EGD) WITH PROPOFOL  05/09/2016   Procedure: ESOPHAGOGASTRODUODENOSCOPY (EGD) WITH PROPOFOL;  Surgeon: Jerene Bears, MD;  Location: [place];  Service: Endoscopy;;  . ESOPHAGOGASTRODUODENOSCOPY (EGD) WITH PROPOFOL N/A 06/16/2016   Procedure: ESOPHAGOGASTRODUODENOSCOPY (EGD) WITH PROPOFOL;  Surgeon: [name], MD;  Location: [facility] ENDOSCOPY;  Service: Endoscopy;  Laterality: N/A;  . GIVENS CAPSULE STUDY N/A 05/07/2016   Procedure: GIVENS CAPSULE STUDY;  Surgeon: Doran Stabler, MD;  Location: [place];  Service: Endoscopy;  Laterality: N/A;  . MANDIBULAR  HARDWARE REMOVAL N/A 07/29/2013   Procedure: REMOVAL OF ARCH BARS;  Surgeon: Theodoro Kos, DO;  Location: [place];  Service: Plastics;  Laterality: N/A;  . ORIF MANDIBULAR FRACTURE N/A 06/05/2013   Procedure: REPAIR OF MANDIBULAR FRACTURE x 2 with maxillo-mandibular fixation ;  Surgeon: Theodoro Kos, DO;  Location: [place];  Service: Plastics;  Laterality: N/A;  . PERIPHERAL ARTERIAL STENT GRAFT Left     Prior to Admission medications   Medication Sig Start Date End Date Taking? Authorizing Provider  albuterol (PROVENTIL HFA;VENTOLIN HFA) 108 (90 Base) MCG/ACT inhaler Inhale 4-6 puffs by mouth every 4 hours as needed for wheezing, cough, and/or shortness of breath 07/11/16   Hinda Kehr, MD  amLODipine (NORVASC) 5 MG tablet Take 5 mg by mouth daily.    Historical Provider, MD  atorvastatin (LIPITOR) 40 MG tablet Take 40 mg by mouth daily. 04/19/16   Historical Provider, MD  azithromycin (ZITHROMAX) 250 MG tablet Take 1 a day for 4 days 07/07/16   Rudene Re, MD  budesonide-formoterol [facility]) 160-4.5 MCG/ACT inhaler Inhale 2 puffs into the lungs daily.    Historical Provider, MD  furosemide (LASIX) 80 MG tablet Take 80 mg by  mouth daily. 05/11/16   Historical Provider, MD  gabapentin (NEURONTIN) 300 MG capsule Take 1 capsule (300 mg total) by mouth daily. Patient taking differently: Take 300 mg by mouth at bedtime.  03/27/15   Bettey Costa, MD  hydrOXYzine (VISTARIL) 25 MG capsule Take 1 capsule (25 mg total) by mouth 3 (three) times daily as needed for itching. Patient not taking: Reported on 07/04/2016 02/25/16   Jami L Hagler, PA-C  labetalol (NORMODYNE) 100 MG tablet Take 1 tablet (100 mg total) by mouth daily. Patient not taking: Reported on 07/04/2016 01/22/16   Epifanio Lesches, MD  Multiple Vitamins-Minerals-FA (DIALYVITE SUPREME D) 3 MG TABS Take 1 tablet by mouth daily. 05/27/16   Historical Provider, MD  mupirocin ointment (BACTROBAN) 2 % Place 1 application into  the nose 2 (two) times daily. 06/01/16   Theodoro Grist, MD  naproxen (NAPROSYN) 500 MG tablet Take 500 mg by mouth 2 (two) times daily as needed. for pain 04/28/16   Historical Provider, MD  nitroGLYCERIN (NITROSTAT) 0.4 MG SL tablet Place 1 tablet (0.4 mg total) under the tongue every 5 (five) minutes as needed. Patient taking differently: Place 0.4 mg under the tongue every 5 (five) minutes as needed for chest pain.  04/12/16   Wende Bushy, MD  oxyCODONE-acetaminophen (ROXICET) 5-325 MG tablet Take 1-2 tablets by mouth every 4 (four) hours as needed for severe pain. Patient not taking: Reported on 07/04/2016 04/21/16   Hinda Kehr, MD  pantoprazole (PROTONIX) 40 MG tablet Take 1 tablet (40 mg total) by mouth daily. 05/10/16   Shanker Kristeen Mans, MD  predniSONE (DELTASONE) 10 MG tablet Take 6 tabs (60 mg) PO x 3 days, then take 4 tabs (40 mg) PO x 3 days, then take 2 tabs (20 mg) PO x 3 days, then take 1 tab (10 mg) PO x 3 days, then take 1/2 tab (5 mg) PO x 4 days. 07/11/16   Hinda Kehr, MD  sevelamer carbonate (RENVELA) 800 MG tablet Take 3 tablets (2,400 mg total) by mouth 3 (three) times daily with meals. 06/01/16   Theodoro Grist, MD    Allergies Patient has no known allergies.  Family History  Problem Relation Age of Onset  . Colon cancer Mother   . Cancer Father   . Cancer Sister     Social History Social History  Substance Use Topics  . Smoking status: Current Every Day Smoker    Packs/day: 0.15    Years: 40.00    Types: Cigarettes  . Smokeless tobacco: Never Used  . Alcohol use No     Comment: pt reports quitting after learning about cirrhosis    Review of Systems Constitutional: No fever/chills Eyes: No visual changes. ENT: No sore throat. Cardiovascular: Denies chest pain. Respiratory: +shortness of breath For weeks Gastrointestinal: No abdominal pain.  No nausea, no vomiting.  No diarrhea.  No constipation. Genitourinary: Negative for dysuria. Musculoskeletal: Negative  for back pain. Integumentary: Negative for rash. Neurological: Negative for headaches, focal weakness or numbness.   ____________________________________________   PHYSICAL EXAM:  VITAL SIGNS: ED Triage Vitals [07/10/16 2338]  Enc Vitals Group     BP (!) 142/85     Pulse Rate 89     Resp (!) 22     Temp 98.5 F (36.9 C)     Temp src      SpO2 97 %     Weight      Height      Head Circumference  Peak Flow      Pain Score      Pain Loc      Pain Edu?      Excl. in [place]?     Constitutional: Alert and oriented. Well appearing and in no acute distress. Eyes: Conjunctivae are normal. PERRL. EOMI. Head: Atraumatic. Nose: No congestion/rhinnorhea. Mouth/Throat: Mucous membranes are moist. Neck: No stridor.  No meningeal signs.   Cardiovascular: Normal rate, regular rhythm. Good peripheral circulation. Grossly normal heart sounds. Respiratory: Normal respiratory effort.  No retractions.  Expiratory wheezing throughout lung fields. Gastrointestinal: Soft and nontender. No distention.  Musculoskeletal: No lower extremity tenderness nor edema. No gross deformities of extremities. Neurologic:  Normal speech and language. No gross focal neurologic deficits are appreciated.  Skin:  Skin is warm, dry and intact. No rash noted. Psychiatric: Mood and affect are normal. Speech and behavior are normal.  ____________________________________________   LABS (all labs ordered are listed, but only abnormal results are displayed)  Labs Reviewed - No data to display ____________________________________________  EKG  ED ECG REPORT I, Inas Avena, the attending physician, personally viewed and interpreted this ECG.  Date: 07/10/2016 EKG Time: 23:39 Rate: 90 Rhythm: normal sinus rhythm QRS Axis: normal Intervals: normal ST/T Wave abnormalities: Non-specific ST segment / T-wave changes, but no evidence of acute ischemia. Inverted T wave Conduction Disturbances: none Narrative  Interpretation: unremarkable  ____________________________________________  RADIOLOGY   Dg Chest 2 View  Result Date: 07/11/2016 CLINICAL DATA:  Recent diagnosis of COPD.  Cough. EXAM: CHEST  2 VIEW COMPARISON:  July 06, 2016 FINDINGS: Mild cardiomegaly. The hila and mediastinum are normal. No pneumothorax. No pulmonary nodules or masses. No focal infiltrates identified. No overt edema. IMPRESSION: No active cardiopulmonary disease. Electronically Signed   By: Dorise Bullion III M.D   On: 07/11/2016 00:29    ____________________________________________   PROCEDURES  Critical Care performed: No   Procedure(s) performed:   Procedures   ____________________________________________   INITIAL IMPRESSION / ASSESSMENT AND PLAN / ED COURSE  Pertinent labs & imaging results that were available during my care of the patient were reviewed by me and considered in my medical decision making (see chart for details).     Clinical Course as of Jul 12 514  Mon Jul 11, 2016  0105 Reassuring CXR DG Chest 2 View [CF]  0236 The patient is well-appearing and in no acute distress.  He is asking for food and coffee and is having no trouble breathing.  He does have some expiratory wheezing but that appears to be his baseline.  He was resting comfortably until I went in the room.  I explained to him that he does not meet criteria for admission and that he needs to take his medications at home and follow-up with his doctor as an outpatient.  Told him to continue going to dialysis.  He reports that he just had dialysis yesterday and he has a reassuring EKG.  I do not feel that there is any benefit to obtaining blood work on him at this time.  I tried to educate him about his COPD and the importance of taking medications but he seems resistant to doing so at home and seems to want to be in the hospital, but I again explained that he does not meet any criteria for admission and needs to try treatment at  home and outpatient follow-up.  I gave my usual and customary return precautions.     [CF]    Clinical Course User  Index [CF] Hinda Kehr, MD    ____________________________________________  FINAL CLINICAL IMPRESSION(S) / ED DIAGNOSES  Final diagnoses:  COPD exacerbation ([place])     MEDICATIONS GIVEN DURING THIS VISIT:  Medications  albuterol (PROVENTIL) (2.5 MG/3ML) 0.083% nebulizer solution 5 mg (5 mg Nebulization Not Given 07/11/16 0013)  ipratropium-albuterol (DUONEB) 0.5-2.5 (3) MG/3ML nebulizer solution 3 mL (3 mLs Nebulization Given 07/11/16 0012)  ipratropium-albuterol (DUONEB) 0.5-2.5 (3) MG/3ML nebulizer solution 3 mL (3 mLs Nebulization Given 07/11/16 0012)  ipratropium-albuterol (DUONEB) 0.5-2.5 (3) MG/3ML nebulizer solution 3 mL (3 mLs Nebulization Given 07/11/16 0245)  predniSONE (DELTASONE) tablet 60 mg (60 mg Oral Given 07/11/16 0246)  acetaminophen (TYLENOL) tablet 650 mg (650 mg Oral Given 07/11/16 0304)     NEW OUTPATIENT MEDICATIONS STARTED DURING THIS VISIT:  New Prescriptions   ALBUTEROL (PROVENTIL HFA;VENTOLIN HFA) 108 (90 BASE) MCG/ACT INHALER    Inhale 4-6 puffs by mouth every 4 hours as needed for wheezing, cough, and/or shortness of breath   PREDNISONE (DELTASONE) 10 MG TABLET    Take 6 tabs (60 mg) PO x 3 days, then take 4 tabs (40 mg) PO x 3 days, then take 2 tabs (20 mg) PO x 3 days, then take 1 tab (10 mg) PO x 3 days, then take 1/2 tab (5 mg) PO x 4 days.    Modified Medications   No medications on file    Discontinued Medications   ALBUTEROL (PROVENTIL HFA;VENTOLIN HFA) 108 (90 BASE) MCG/ACT INHALER    Inhale 2 puffs into the lungs every 6 (six) hours as needed for wheezing or shortness of breath.   PREDNISONE (DELTASONE) 20 MG TABLET    Take 3 tablets (60 mg total) by mouth daily.     Note:  This document was prepared using Dragon voice recognition software and may include unintentional dictation errors.   Hinda Kehr, MD 07/11/16 [phone]

## 2016-07-11 NOTE — ED Notes (Signed)
Patient transported to X-ray 

## 2016-07-11 NOTE — ED Notes (Signed)
Report from rachel, rn.  

## 2016-07-11 NOTE — Discharge Instructions (Signed)
We believe that your symptoms are caused today by an exacerbation of your COPD.  Please take the prescribed medications and any medications that you have at home for your COPD.  Follow up with your doctor as recommended.  If you develop any new or worsening symptoms, including but not limited to fever, persistent vomiting, worsening shortness of breath, or other symptoms that concern you, please return to the Emergency Department immediately.  

## 2016-07-12 NOTE — ED Notes (Signed)
Pain score recorded for discharge purposes only. Pt no longer at [facility] ED.  Departure condition documented at 0307 by Antonieta Pert RN.

## 2016-07-14 DIAGNOSIS — D649 Anemia, unspecified: Secondary | ICD-10-CM | POA: Diagnosis not present

## 2016-07-14 DIAGNOSIS — N186 End stage renal disease: Secondary | ICD-10-CM | POA: Diagnosis not present

## 2016-07-15 DIAGNOSIS — Z1159 Encounter for screening for other viral diseases: Secondary | ICD-10-CM | POA: Diagnosis not present

## 2016-07-15 DIAGNOSIS — J441 Chronic obstructive pulmonary disease with (acute) exacerbation: Secondary | ICD-10-CM | POA: Diagnosis not present

## 2016-07-19 DIAGNOSIS — D649 Anemia, unspecified: Secondary | ICD-10-CM | POA: Diagnosis not present

## 2016-07-19 DIAGNOSIS — N186 End stage renal disease: Secondary | ICD-10-CM | POA: Diagnosis not present

## 2016-07-21 DIAGNOSIS — N186 End stage renal disease: Secondary | ICD-10-CM | POA: Diagnosis not present

## 2016-07-21 DIAGNOSIS — D649 Anemia, unspecified: Secondary | ICD-10-CM | POA: Diagnosis not present

## 2016-07-22 ENCOUNTER — Ambulatory Visit
Admission: RE | Admit: 2016-07-22 | Discharge: 2016-07-22 | Disposition: A | Discharge status: Home / Self Care | Payer: Medicare Other | Source: Ambulatory Visit | Attending: Nephrology | Admitting: Nephrology

## 2016-07-22 DIAGNOSIS — N186 End stage renal disease: Secondary | ICD-10-CM | POA: Diagnosis not present

## 2016-07-22 DIAGNOSIS — D631 Anemia in chronic kidney disease: Secondary | ICD-10-CM | POA: Diagnosis not present

## 2016-07-22 LAB — HEMOGLOBIN: Hemoglobin: 4.7 g/dL — CL (ref 13.0–18.0)

## 2016-07-22 LAB — GLUCOSE, CAPILLARY: Glucose-Capillary: 140 mg/dL — ABNORMAL HIGH (ref 65–99)

## 2016-07-22 LAB — PREPARE RBC (CROSSMATCH)

## 2016-07-22 MED ORDER — PANTOPRAZOLE SODIUM 40 MG PO TBEC
40.0000 mg | DELAYED_RELEASE_TABLET | Freq: Once | ORAL | Status: AC
  Administered 2016-07-22: 40 mg via ORAL
  Filled 2016-07-22: qty 1

## 2016-07-22 MED ORDER — SODIUM CHLORIDE 0.9 % IV SOLN
Freq: Once | INTRAVENOUS | Status: AC
  Administered 2016-07-22: 08:00:00 via INTRAVENOUS

## 2016-07-22 MED ORDER — MORPHINE SULFATE (PF) 4 MG/ML IV SOLN
4.0000 mg | Freq: Once | INTRAVENOUS | Status: AC
  Administered 2016-07-22: 4 mg via INTRAMUSCULAR

## 2016-07-22 MED ORDER — MORPHINE SULFATE (PF) 4 MG/ML IV SOLN
INTRAVENOUS | Status: AC
  Administered 2016-07-22: 4 mg via INTRAMUSCULAR
  Filled 2016-07-22: qty 1

## 2016-07-22 NOTE — OR Nursing (Signed)
DR. Juleen China notified of patient complaints of reflux and that patient has not had med in a week.  Order received for Protonix 40 mg po.  Also notified Dr Juleen China that patient states " I need an appointment made to have the fluid drained from my abdomen."  Dr Juleen China will have office set up this appointment.   Order also received to increase rate of blood to 166ml/hr.

## 2016-07-22 NOTE — OR Nursing (Signed)
Dr Juleen China notified for patient complaints of pain in feet.  [place] received.  Also  made aware cancer center called and patient is not a patient there.

## 2016-07-22 NOTE — OR Nursing (Signed)
Dr Juleen China notified of hemoglobin of 4.7.  No new orders at this time.  Dr Juleen China requested labs be faxed to Dr Vicente Males and Dr Burlene Arnt.  Results faxed.

## 2016-07-22 NOTE — OR Nursing (Signed)
Called Dr Juleen China to given patient update no new orders at this time,  patient may be discharged after transfusion.  Dr Juleen China will draw labs next week per our conversation.

## 2016-07-23 DIAGNOSIS — D649 Anemia, unspecified: Secondary | ICD-10-CM | POA: Diagnosis not present

## 2016-07-23 DIAGNOSIS — N186 End stage renal disease: Secondary | ICD-10-CM | POA: Diagnosis not present

## 2016-07-23 LAB — TYPE AND SCREEN
ABO/RH(D): A POS
Antibody Screen: NEGATIVE
UNIT DIVISION: 0
UNIT DIVISION: 0
Unit division: 0

## 2016-07-23 LAB — BPAM RBC
BLOOD PRODUCT EXPIRATION DATE: 201805172359
Blood Product Expiration Date: 201805232359
Blood Product Expiration Date: 201806012359
ISSUE DATE / TIME: 201805111002
ISSUE DATE / TIME: 201805111241
Unit Type and Rh: 600
Unit Type and Rh: 6200
Unit Type and Rh: 6200

## 2016-07-26 DIAGNOSIS — N186 End stage renal disease: Secondary | ICD-10-CM | POA: Diagnosis not present

## 2016-07-26 DIAGNOSIS — D649 Anemia, unspecified: Secondary | ICD-10-CM | POA: Diagnosis not present

## 2016-07-28 ENCOUNTER — Telehealth: Payer: Self-pay

## 2016-07-28 ENCOUNTER — Other Ambulatory Visit: Payer: Self-pay

## 2016-07-28 DIAGNOSIS — D649 Anemia, unspecified: Secondary | ICD-10-CM | POA: Diagnosis not present

## 2016-07-28 DIAGNOSIS — N186 End stage renal disease: Secondary | ICD-10-CM | POA: Diagnosis not present

## 2016-07-28 NOTE — Telephone Encounter (Signed)
Attempted to contact patient x2 to reschedule 2-day prep colonoscopy.  VM not setup and no answer.

## 2016-07-28 NOTE — Telephone Encounter (Signed)
-----   Message from [name], MD sent at 07/22/2016 11:14 AM EDT ----- Regarding: RE: HGB Results 1. If having overt(visible to the eye)  GI bleed suggest admission if not overtly bleeding then he needs to be set up with a colonoscopy as last time he had bad prep . Needs 2 day prep .    ----- Message ----- From: Leontine Locket, CMA Sent: 07/22/2016  10:36 AM To: [name], MD Subject: HGB Results                                    Dr. Juleen China wanted you to be aware that the patien'ts HGB - 4.7. Currently having blood transfusion. (2u RBC)

## 2016-08-02 DIAGNOSIS — N186 End stage renal disease: Secondary | ICD-10-CM | POA: Diagnosis not present

## 2016-08-02 DIAGNOSIS — D649 Anemia, unspecified: Secondary | ICD-10-CM | POA: Diagnosis not present

## 2016-08-04 DIAGNOSIS — N186 End stage renal disease: Secondary | ICD-10-CM | POA: Diagnosis not present

## 2016-08-04 DIAGNOSIS — D649 Anemia, unspecified: Secondary | ICD-10-CM | POA: Diagnosis not present

## 2016-08-09 ENCOUNTER — Emergency Department: Payer: Medicare Other

## 2016-08-09 ENCOUNTER — Inpatient Hospital Stay
Admission: EM | Admit: 2016-08-09 | Discharge: 2016-08-12 | DRG: 640 | Discharge status: Against Medical Advice | Payer: Medicare Other | Attending: Internal Medicine | Admitting: Internal Medicine

## 2016-08-09 DIAGNOSIS — Z992 Dependence on renal dialysis: Secondary | ICD-10-CM

## 2016-08-09 DIAGNOSIS — K633 Ulcer of intestine: Secondary | ICD-10-CM | POA: Diagnosis not present

## 2016-08-09 DIAGNOSIS — E1122 Type 2 diabetes mellitus with diabetic chronic kidney disease: Secondary | ICD-10-CM | POA: Diagnosis present

## 2016-08-09 DIAGNOSIS — D123 Benign neoplasm of transverse colon: Secondary | ICD-10-CM | POA: Diagnosis present

## 2016-08-09 DIAGNOSIS — N2581 Secondary hyperparathyroidism of renal origin: Secondary | ICD-10-CM | POA: Diagnosis present

## 2016-08-09 DIAGNOSIS — N186 End stage renal disease: Secondary | ICD-10-CM | POA: Diagnosis present

## 2016-08-09 DIAGNOSIS — E1142 Type 2 diabetes mellitus with diabetic polyneuropathy: Secondary | ICD-10-CM | POA: Diagnosis present

## 2016-08-09 DIAGNOSIS — Z7951 Long term (current) use of inhaled steroids: Secondary | ICD-10-CM | POA: Diagnosis not present

## 2016-08-09 DIAGNOSIS — K635 Polyp of colon: Secondary | ICD-10-CM | POA: Diagnosis not present

## 2016-08-09 DIAGNOSIS — R259 Unspecified abnormal involuntary movements: Secondary | ICD-10-CM | POA: Diagnosis not present

## 2016-08-09 DIAGNOSIS — K5520 Angiodysplasia of colon without hemorrhage: Secondary | ICD-10-CM | POA: Diagnosis not present

## 2016-08-09 DIAGNOSIS — R600 Localized edema: Secondary | ICD-10-CM | POA: Diagnosis not present

## 2016-08-09 DIAGNOSIS — I517 Cardiomegaly: Secondary | ICD-10-CM | POA: Diagnosis not present

## 2016-08-09 DIAGNOSIS — I25810 Atherosclerosis of coronary artery bypass graft(s) without angina pectoris: Secondary | ICD-10-CM | POA: Diagnosis not present

## 2016-08-09 DIAGNOSIS — I10 Essential (primary) hypertension: Secondary | ICD-10-CM | POA: Diagnosis not present

## 2016-08-09 DIAGNOSIS — M62838 Other muscle spasm: Secondary | ICD-10-CM | POA: Diagnosis not present

## 2016-08-09 DIAGNOSIS — K922 Gastrointestinal hemorrhage, unspecified: Secondary | ICD-10-CM

## 2016-08-09 DIAGNOSIS — F99 Mental disorder, not otherwise specified: Secondary | ICD-10-CM | POA: Diagnosis not present

## 2016-08-09 DIAGNOSIS — R195 Other fecal abnormalities: Secondary | ICD-10-CM | POA: Diagnosis not present

## 2016-08-09 DIAGNOSIS — Z9119 Patient's noncompliance with other medical treatment and regimen: Secondary | ICD-10-CM

## 2016-08-09 DIAGNOSIS — Z5321 Procedure and treatment not carried out due to patient leaving prior to being seen by health care provider: Secondary | ICD-10-CM | POA: Diagnosis not present

## 2016-08-09 DIAGNOSIS — Z9861 Coronary angioplasty status: Secondary | ICD-10-CM

## 2016-08-09 DIAGNOSIS — I2510 Atherosclerotic heart disease of native coronary artery without angina pectoris: Secondary | ICD-10-CM | POA: Diagnosis present

## 2016-08-09 DIAGNOSIS — Z7952 Long term (current) use of systemic steroids: Secondary | ICD-10-CM | POA: Diagnosis not present

## 2016-08-09 DIAGNOSIS — B3781 Candidal esophagitis: Secondary | ICD-10-CM | POA: Diagnosis not present

## 2016-08-09 DIAGNOSIS — J441 Chronic obstructive pulmonary disease with (acute) exacerbation: Secondary | ICD-10-CM | POA: Diagnosis present

## 2016-08-09 DIAGNOSIS — D649 Anemia, unspecified: Secondary | ICD-10-CM | POA: Diagnosis not present

## 2016-08-09 DIAGNOSIS — L03116 Cellulitis of left lower limb: Secondary | ICD-10-CM | POA: Diagnosis present

## 2016-08-09 DIAGNOSIS — I120 Hypertensive chronic kidney disease with stage 5 chronic kidney disease or end stage renal disease: Secondary | ICD-10-CM | POA: Diagnosis present

## 2016-08-09 DIAGNOSIS — E1151 Type 2 diabetes mellitus with diabetic peripheral angiopathy without gangrene: Secondary | ICD-10-CM | POA: Diagnosis present

## 2016-08-09 DIAGNOSIS — E785 Hyperlipidemia, unspecified: Secondary | ICD-10-CM | POA: Diagnosis present

## 2016-08-09 DIAGNOSIS — F1021 Alcohol dependence, in remission: Secondary | ICD-10-CM | POA: Diagnosis present

## 2016-08-09 DIAGNOSIS — K31811 Angiodysplasia of stomach and duodenum with bleeding: Secondary | ICD-10-CM | POA: Diagnosis present

## 2016-08-09 DIAGNOSIS — Z800 Family history of malignant neoplasm of digestive organs: Secondary | ICD-10-CM

## 2016-08-09 DIAGNOSIS — D62 Acute posthemorrhagic anemia: Secondary | ICD-10-CM | POA: Diagnosis present

## 2016-08-09 DIAGNOSIS — E875 Hyperkalemia: Secondary | ICD-10-CM | POA: Diagnosis not present

## 2016-08-09 DIAGNOSIS — D631 Anemia in chronic kidney disease: Secondary | ICD-10-CM | POA: Diagnosis present

## 2016-08-09 DIAGNOSIS — K7031 Alcoholic cirrhosis of liver with ascites: Secondary | ICD-10-CM | POA: Diagnosis present

## 2016-08-09 DIAGNOSIS — K2970 Gastritis, unspecified, without bleeding: Secondary | ICD-10-CM | POA: Diagnosis present

## 2016-08-09 DIAGNOSIS — R05 Cough: Secondary | ICD-10-CM | POA: Diagnosis not present

## 2016-08-09 DIAGNOSIS — Z951 Presence of aortocoronary bypass graft: Secondary | ICD-10-CM

## 2016-08-09 DIAGNOSIS — N189 Chronic kidney disease, unspecified: Secondary | ICD-10-CM

## 2016-08-09 DIAGNOSIS — Z9115 Patient's noncompliance with renal dialysis: Secondary | ICD-10-CM

## 2016-08-09 DIAGNOSIS — F17210 Nicotine dependence, cigarettes, uncomplicated: Secondary | ICD-10-CM | POA: Diagnosis present

## 2016-08-09 DIAGNOSIS — K210 Gastro-esophageal reflux disease with esophagitis: Secondary | ICD-10-CM | POA: Diagnosis present

## 2016-08-09 DIAGNOSIS — K640 First degree hemorrhoids: Secondary | ICD-10-CM | POA: Diagnosis present

## 2016-08-09 DIAGNOSIS — R188 Other ascites: Secondary | ICD-10-CM

## 2016-08-09 DIAGNOSIS — G8929 Other chronic pain: Secondary | ICD-10-CM | POA: Diagnosis present

## 2016-08-09 LAB — COMPREHENSIVE METABOLIC PANEL
ALT: 21 U/L (ref 17–63)
ANION GAP: 18 — AB (ref 5–15)
AST: 33 U/L (ref 15–41)
Albumin: 3.1 g/dL — ABNORMAL LOW (ref 3.5–5.0)
Alkaline Phosphatase: 164 U/L — ABNORMAL HIGH (ref 38–126)
BUN: 105 mg/dL — ABNORMAL HIGH (ref 6–20)
CHLORIDE: 100 mmol/L — AB (ref 101–111)
CO2: 21 mmol/L — ABNORMAL LOW (ref 22–32)
CREATININE: 11.49 mg/dL — AB (ref 0.61–1.24)
Calcium: 9.2 mg/dL (ref 8.9–10.3)
GFR, EST AFRICAN AMERICAN: 5 mL/min — AB (ref >60)
GFR, EST NON AFRICAN AMERICAN: 4 mL/min — AB (ref >60)
Glucose, Bld: 76 mg/dL (ref 65–99)
Potassium: 7.4 mmol/L (ref 3.5–5.1)
Sodium: 139 mmol/L (ref 135–145)
Total Bilirubin: 0.9 mg/dL (ref 0.3–1.2)
Total Protein: 6.9 g/dL (ref 6.5–8.1)

## 2016-08-09 LAB — CBC
HCT: 18.4 % — ABNORMAL LOW (ref 40.0–52.0)
Hemoglobin: 5.3 g/dL — ABNORMAL LOW (ref 13.0–18.0)
MCH: 25.4 pg — AB (ref 26.0–34.0)
MCHC: 28.8 g/dL — AB (ref 32.0–36.0)
MCV: 87.9 fL (ref 80.0–100.0)
PLATELETS: 414 10*3/uL (ref 150–440)
RBC: 2.09 MIL/uL — AB (ref 4.40–5.90)
RDW: 18.9 % — ABNORMAL HIGH (ref 11.5–14.5)
WBC: 19 10*3/uL — ABNORMAL HIGH (ref 3.8–10.6)

## 2016-08-09 LAB — POTASSIUM: POTASSIUM: 4.4 mmol/L (ref 3.5–5.1)

## 2016-08-09 LAB — GLUCOSE, CAPILLARY
GLUCOSE-CAPILLARY: 133 mg/dL — AB (ref 65–99)
GLUCOSE-CAPILLARY: 78 mg/dL (ref 65–99)

## 2016-08-09 LAB — PREPARE RBC (CROSSMATCH)

## 2016-08-09 LAB — HEMOGLOBIN AND HEMATOCRIT, BLOOD
HCT: 21.2 % — ABNORMAL LOW (ref 40.0–52.0)
Hemoglobin: 6.6 g/dL — ABNORMAL LOW (ref 13.0–18.0)

## 2016-08-09 MED ORDER — LORAZEPAM 2 MG/ML IJ SOLN
INTRAMUSCULAR | Status: AC
Start: 2016-08-09 — End: 2016-08-10
  Filled 2016-08-09: qty 1

## 2016-08-09 MED ORDER — RENA-VITE PO TABS
1.0000 | ORAL_TABLET | Freq: Every day | ORAL | Status: DC
  Administered 2016-08-09 – 2016-08-11 (×3): 1 via ORAL
  Filled 2016-08-09 (×3): qty 1

## 2016-08-09 MED ORDER — SODIUM CHLORIDE 0.9 % IV BOLUS (SEPSIS)
1000.0000 mL | Freq: Once | INTRAVENOUS | Status: AC
  Administered 2016-08-09: 1000 mL via INTRAVENOUS

## 2016-08-09 MED ORDER — SODIUM CHLORIDE 0.9 % IV SOLN
10.0000 mL/h | Freq: Once | INTRAVENOUS | Status: AC
  Administered 2016-08-09: 10 mL/h via INTRAVENOUS

## 2016-08-09 MED ORDER — PANTOPRAZOLE SODIUM 40 MG PO TBEC
40.0000 mg | DELAYED_RELEASE_TABLET | Freq: Every day | ORAL | Status: DC
Start: 2016-08-09 — End: 2016-08-10

## 2016-08-09 MED ORDER — HYDROXYZINE PAMOATE 25 MG PO CAPS
25.0000 mg | ORAL_CAPSULE | Freq: Three times a day (TID) | ORAL | Status: DC | PRN
  Filled 2016-08-09: qty 1

## 2016-08-09 MED ORDER — HYDRALAZINE HCL 20 MG/ML IJ SOLN: 10.0000 mg | Freq: Four times a day (QID) | INTRAMUSCULAR | Status: DC | PRN

## 2016-08-09 MED ORDER — DOCUSATE SODIUM 100 MG PO CAPS
100.0000 mg | ORAL_CAPSULE | Freq: Two times a day (BID) | ORAL | Status: DC
Start: 2016-08-09 — End: 2016-08-12
  Administered 2016-08-09 – 2016-08-10 (×2): 100 mg via ORAL
  Filled 2016-08-09 (×5): qty 1

## 2016-08-09 MED ORDER — ONDANSETRON HCL 4 MG PO TABS: 4.0000 mg | ORAL_TABLET | Freq: Four times a day (QID) | ORAL | Status: DC | PRN

## 2016-08-09 MED ORDER — INSULIN ASPART 100 UNIT/ML ~~LOC~~ SOLN
10.0000 [IU] | Freq: Once | SUBCUTANEOUS | Status: AC
  Administered 2016-08-09: 10 [IU] via INTRAVENOUS
  Filled 2016-08-09: qty 10

## 2016-08-09 MED ORDER — LORAZEPAM 2 MG/ML IJ SOLN
1.0000 mg | Freq: Once | INTRAMUSCULAR | Status: AC
  Administered 2016-08-09: 1 mg via INTRAVENOUS

## 2016-08-09 MED ORDER — ONDANSETRON HCL 4 MG/2ML IJ SOLN: 4.0000 mg | Freq: Four times a day (QID) | INTRAMUSCULAR | Status: DC | PRN

## 2016-08-09 MED ORDER — ACETAMINOPHEN 325 MG PO TABS: 650.0000 mg | ORAL_TABLET | Freq: Four times a day (QID) | ORAL | Status: DC | PRN

## 2016-08-09 MED ORDER — TRAMADOL HCL 50 MG PO TABS
50.0000 mg | ORAL_TABLET | Freq: Four times a day (QID) | ORAL | Status: DC | PRN
  Administered 2016-08-10: 50 mg via ORAL
  Filled 2016-08-09: qty 1

## 2016-08-09 MED ORDER — FUROSEMIDE 80 MG PO TABS
80.0000 mg | ORAL_TABLET | Freq: Every day | ORAL | Status: DC
  Administered 2016-08-09 – 2016-08-11 (×3): 80 mg via ORAL
  Filled 2016-08-09 (×2): qty 2

## 2016-08-09 MED ORDER — SEVELAMER CARBONATE 800 MG PO TABS
2400.0000 mg | ORAL_TABLET | Freq: Three times a day (TID) | ORAL | Status: DC
  Administered 2016-08-10 – 2016-08-11 (×6): 2400 mg via ORAL
  Filled 2016-08-09 (×7): qty 3

## 2016-08-09 MED ORDER — MOMETASONE FURO-FORMOTEROL FUM 200-5 MCG/ACT IN AERO
2.0000 | INHALATION_SPRAY | Freq: Two times a day (BID) | RESPIRATORY_TRACT | Status: DC
  Administered 2016-08-09 – 2016-08-12 (×6): 2 via RESPIRATORY_TRACT
  Filled 2016-08-09 (×2): qty 8.8

## 2016-08-09 MED ORDER — EPOETIN ALFA 10000 UNIT/ML IJ SOLN
10000.0000 [IU] | INTRAMUSCULAR | Status: DC
  Administered 2016-08-09 – 2016-08-11 (×2): 10000 [IU] via INTRAVENOUS
  Filled 2016-08-09: qty 1

## 2016-08-09 MED ORDER — PANTOPRAZOLE SODIUM 40 MG PO TBEC
40.0000 mg | DELAYED_RELEASE_TABLET | Freq: Every day | ORAL | Status: DC
  Administered 2016-08-09 – 2016-08-11 (×3): 40 mg via ORAL
  Filled 2016-08-09 (×3): qty 1

## 2016-08-09 MED ORDER — DEXTROSE 50 % IV SOLN
25.0000 g | Freq: Once | INTRAVENOUS | Status: AC
  Administered 2016-08-09: 25 g via INTRAVENOUS
  Filled 2016-08-09: qty 50

## 2016-08-09 MED ORDER — ATORVASTATIN CALCIUM 20 MG PO TABS
40.0000 mg | ORAL_TABLET | Freq: Every day | ORAL | Status: DC
  Administered 2016-08-09 – 2016-08-11 (×3): 40 mg via ORAL
  Filled 2016-08-09 (×3): qty 2

## 2016-08-09 MED ORDER — PHENOL 1.4 % MT LIQD
1.0000 | OROMUCOSAL | Status: DC | PRN
  Filled 2016-08-09: qty 177

## 2016-08-09 MED ORDER — ALBUTEROL SULFATE (2.5 MG/3ML) 0.083% IN NEBU: 2.5000 mg | INHALATION_SOLUTION | RESPIRATORY_TRACT | Status: DC | PRN

## 2016-08-09 MED ORDER — AMLODIPINE BESYLATE 5 MG PO TABS
5.0000 mg | ORAL_TABLET | Freq: Every day | ORAL | Status: DC
Start: 2016-08-09 — End: 2016-08-10
  Administered 2016-08-10: 5 mg via ORAL
  Filled 2016-08-09: qty 1

## 2016-08-09 MED ORDER — GABAPENTIN 300 MG PO CAPS
300.0000 mg | ORAL_CAPSULE | Freq: Every day | ORAL | Status: DC
  Administered 2016-08-09 – 2016-08-11 (×3): 300 mg via ORAL
  Filled 2016-08-09 (×3): qty 1

## 2016-08-09 MED ORDER — ACETAMINOPHEN 650 MG RE SUPP: 650.0000 mg | Freq: Four times a day (QID) | RECTAL | Status: DC | PRN

## 2016-08-09 MED ORDER — LABETALOL HCL 100 MG PO TABS
100.0000 mg | ORAL_TABLET | Freq: Every day | ORAL | Status: DC
  Administered 2016-08-10 – 2016-08-12 (×3): 100 mg via ORAL
  Filled 2016-08-09 (×3): qty 1

## 2016-08-09 NOTE — Progress Notes (Signed)
Pre hd assessment  

## 2016-08-09 NOTE — ED Notes (Addendum)
Pt readjusted in bed wedge placed

## 2016-08-09 NOTE — Progress Notes (Signed)
eLink Physician-Brief Progress Note Patient Name: Ether Wolters DOB: 03/09/1961 MRN: [id number]   Date of Service  08/09/2016  HPI/Events of Note  Patient admitted with leg spasms to stepdown unit. Missed hemodialysis this weekend. Also anemic on presentation. Undergoing transfusion & hemodialysis per nephrology for hyperkalemia. Known history of liver cirrhosis without any prior history of esophageal varices. GI also consulted. Patient currently resting comfortably.   eICU Interventions  Continuing plan of care as per admitting service.      Intervention Category Evaluation Type: New Patient Evaluation  Tera Partridge 08/09/2016, 5:29 PM

## 2016-08-09 NOTE — Progress Notes (Signed)
This note also relates to the following rows which could not be included: Resp - Cannot attach notes to unvalidated device data  Pre hd info

## 2016-08-09 NOTE — ED Notes (Signed)
Pt eating ice chips

## 2016-08-09 NOTE — ED Triage Notes (Addendum)
Pt comes via [facility] from home for c/o muscle spasms. Pt states he took his gabapentin and may have taken too much on Saturday and has had shakes and spasms since then. Pt has been unable to eat or drink since Saturday. Pt has continued to take the gabapentin. Pt missed his dialysis on Saturday and Tuesday. Per EMS VS stable and NSR EKG. Pt has noticeable shakes and jeks.

## 2016-08-09 NOTE — Progress Notes (Signed)
[facility], [place] 08/09/16  Subjective:   Patient presented to the emergency room for abdominal distention His labs showed critically high potassium of 7.4 Nephrology consult requested for urgent dialysis Patient denies any acute shortness of breath  Objective:  Vital signs in last 24 hours:  Temp:  [98.8 F (37.1 C)] 98.8 F (37.1 C) (05/29 1024) Pulse Rate:  [81-86] 81 (05/29 1200) Resp:  [14-22] 15 (05/29 1330) BP: (114-127)/(59-84) 123/59 (05/29 1330) SpO2:  [98 %-100 %] 100 % (05/29 1200) Weight:  [81.6 kg (180 lb)] 81.6 kg (180 lb) (05/29 1024)  Weight change:  Filed Weights   08/09/16 1024  Weight: 81.6 kg (180 lb)    Intake/Output:   No intake or output data in the 24 hours ending 08/09/16 1418   Physical Exam: General: No acute distress, laying in the bed  HEENT Anicteric, dry oral mucous membranes  Neck supple  Pulm/lungs Shallow breath, no crackles  CVS/Heart Regular, no rub  Abdomen:  Distended, ascites  Extremities: 2-3+ pitting edema bilaterally  Neurologic: Somnolent but able to answer questions  Skin: No acute rashes  Access: AV fistula       Basic Metabolic Panel:   Recent Labs Lab 08/09/16 1110  NA 139  K 7.4*  CL 100*  CO2 21*  GLUCOSE 76  BUN 105*  CREATININE 11.49*  CALCIUM 9.2     CBC:  Recent Labs Lab 08/09/16 1110  WBC 19.0*  HGB 5.3*  HCT 18.4*  MCV 87.9  PLT 414     Lab Results  Component Value Date   HEPBSAG Negative 06/01/2016   HEPBSAB Reactive 06/01/2016   HEPBIGM Negative 06/01/2016      Microbiology:  No results found for this or any previous visit (from the past 240 hour(s)).  Coagulation Studies: No results for input(s): LABPROT, INR in the last 72 hours.  Urinalysis: No results for input(s): COLORURINE, LABSPEC, PHURINE, GLUCOSEU, HGBUR, BILIRUBINUR, KETONESUR, PROTEINUR, UROBILINOGEN, NITRITE, LEUKOCYTESUR in the last 72 hours.  Invalid input(s): APPERANCEUR     Imaging: No results found.   Medications:   . sodium chloride     . dextrose  25 g Intravenous Once  . insulin aspart  10 Units Intravenous Once  . LORazepam         Assessment/ Plan:  56 y.o. male with ESRD on hemodialysis, hypertension, peripheral vascular disease, coronary artery disease, cirrhosis, substance abuse, recurrent GI bleed, admission for SOB 07/03/16.  TTS [facility] [place] Right arm AVF   1. End stage renal disease: TTS. Patient due for hemodialysis today. Orders have been prepared. Continue dialysis on a TTS schedule.  2. Hypertension: Blood pressure low normal - will monitor  3. Anemia of chronic kidney disease with recent GI blood loss:  - hemoglobin low at 5.3.  Blood transfusions planned with dialysis - Procrit with dialysis  4. Secondary Hyperparathyroidism:  - check phosphorus with dialysis today.  Otherwise continue current dosage of Renvela once able to take PO  5. Severe hyperkalemia Urgent hemodialysis today Low potassium bath Repeat potassium in the morning  6.  Lower extremity edema - Ultrafiltration with dialysis as tolerated   LOS: 0 Lakayla Barrington 5/29/20182:18 PM  [place], [place]

## 2016-08-09 NOTE — H&P (Signed)
[facility] NAME: Stanley [name]    MR#:  [id number]  DATE OF BIRTH:  [birth year]  DATE OF ADMISSION:  08/09/2016  PRIMARY CARE PHYSICIAN: [name], MD   REQUESTING/REFERRING PHYSICIAN: Dr Vicente Males  CHIEF COMPLAINT:   My legs are jumping and my legs hurt HISTORY OF PRESENT ILLNESS:  Stanley [name]  is a 56 y.o. male with a known history of Alcohol excess cirrhosis of liver not drinking alcohol anymore according to patient, coronary artery disease, drug abuse, end-stage renal disease on hemodialysis comes to the emergency room after he started having significant leg spasm and "" legs jumping. He's been taking extra doses of gabapentin more than his routine dose came to the emergency room with acute on chronic leg pain. He was found to have hemoglobin of 5.3 and potassium of 7.4. Patient has missed dialysis on Saturday since he was not able to go because of his legs bothering him.  He was seen in the emergency room by Dr. [name] and is put orders for emergent dialysis EKG does not show any peaked T waves 2 units of blood transfusion have been ordered by the ER physician  Patient is being admitted with severe acute hyperkalemia and acute on chronic anemia. He is being admitted to ICU stepdown  PAST MEDICAL HISTORY:   Past Medical History:  Diagnosis Date  . Alcohol abuse   . Cirrhosis ([place])   . Coronary artery disease 2009  . Diabetic peripheral neuropathy ([place])   . Drug abuse   . End stage renal disease on dialysis [facility]) NEPHROLOGIST-   DR [facility]  IN [place]   HEMODIALYSIS --   TUES/  THURS/  SAT  . GERD (gastroesophageal reflux disease)   . Hyperlipidemia   . Hypertension   . PAD (peripheral artery disease) ([place])   . Renal insufficiency    Per pt, 32 oz fluid restriction per day  . S/P triple vessel bypass 06/09/2016   2009ish  . Suicidal ideation    & HOMICIDAL IDEATION --  06-16-2013   ADMITTED TO  BEHAVIOR HEALTH    PAST SURGICAL HISTOIRY:   Past Surgical History:  Procedure Laterality Date  . AGILE CAPSULE N/A 06/19/2016   Procedure: AGILE CAPSULE;  Surgeon: [name], MD;  Location: [facility] ENDOSCOPY;  Service: Endoscopy;  Laterality: N/A;  . COLONOSCOPY WITH PROPOFOL N/A 06/18/2016   Procedure: COLONOSCOPY WITH PROPOFOL;  Surgeon: [name], MD;  Location: [facility] ENDOSCOPY;  Service: Endoscopy;  Laterality: N/A;  . CORONARY ANGIOPLASTY  ?   PT UNABLE TO TELL IF  BEFORE OR AFTER  CABG  . CORONARY ARTERY BYPASS GRAFT  2008  ([place] , [place])   3 VESSEL  . DIALYSIS FISTULA CREATION  LAST SURGERY  APPOX  2008  . ENTEROSCOPY N/A 05/10/2016   Procedure: ENTEROSCOPY;  Surgeon: Jerene Bears, MD;  Location: [place];  Service: Gastroenterology;  Laterality: N/A;  . ESOPHAGOGASTRODUODENOSCOPY N/A 05/07/2015   Procedure: ESOPHAGOGASTRODUODENOSCOPY (EGD);  Surgeon: Hulen Luster, MD;  Location: [facility] ENDOSCOPY;  Service: Endoscopy;  Laterality: N/A;  . ESOPHAGOGASTRODUODENOSCOPY (EGD) WITH PROPOFOL N/A 05/17/2015   Procedure: ESOPHAGOGASTRODUODENOSCOPY (EGD) WITH PROPOFOL;  Surgeon: [name], MD;  Location: [facility] ENDOSCOPY;  Service: Endoscopy;  Laterality: N/A;  . ESOPHAGOGASTRODUODENOSCOPY (EGD) WITH PROPOFOL N/A 01/20/2016   Procedure: ESOPHAGOGASTRODUODENOSCOPY (EGD) WITH PROPOFOL;  Surgeon: [name], MD;  Location: [facility] ENDOSCOPY;  Service: Endoscopy;  Laterality: N/A;  . ESOPHAGOGASTRODUODENOSCOPY (EGD) WITH PROPOFOL N/A 04/17/2016  Procedure: ESOPHAGOGASTRODUODENOSCOPY (EGD) WITH PROPOFOL;  Surgeon: Lin Landsman, MD;  Location: [facility] ENDOSCOPY;  Service: Gastroenterology;  Laterality: N/A;  . ESOPHAGOGASTRODUODENOSCOPY (EGD) WITH PROPOFOL  05/09/2016   Procedure: ESOPHAGOGASTRODUODENOSCOPY (EGD) WITH PROPOFOL;  Surgeon: Jerene Bears, MD;  Location: [place];  Service: Endoscopy;;  . ESOPHAGOGASTRODUODENOSCOPY (EGD) WITH PROPOFOL N/A 06/16/2016   Procedure: ESOPHAGOGASTRODUODENOSCOPY (EGD)  WITH PROPOFOL;  Surgeon: [name], MD;  Location: [facility] ENDOSCOPY;  Service: Endoscopy;  Laterality: N/A;  . GIVENS CAPSULE STUDY N/A 05/07/2016   Procedure: GIVENS CAPSULE STUDY;  Surgeon: Doran Stabler, MD;  Location: [place];  Service: Endoscopy;  Laterality: N/A;  . MANDIBULAR HARDWARE REMOVAL N/A 07/29/2013   Procedure: REMOVAL OF ARCH BARS;  Surgeon: Theodoro Kos, DO;  Location: [place];  Service: Plastics;  Laterality: N/A;  . ORIF MANDIBULAR FRACTURE N/A 06/05/2013   Procedure: REPAIR OF MANDIBULAR FRACTURE x 2 with maxillo-mandibular fixation ;  Surgeon: Theodoro Kos, DO;  Location: [place];  Service: Plastics;  Laterality: N/A;  . PERIPHERAL ARTERIAL STENT GRAFT Left     SOCIAL HISTORY:   Social History  Substance Use Topics  . Smoking status: Current Every Day Smoker    Packs/day: 0.15    Years: 40.00    Types: Cigarettes  . Smokeless tobacco: Never Used  . Alcohol use No     Comment: pt reports quitting after learning about cirrhosis    FAMILY HISTORY:   Family History  Problem Relation Age of Onset  . Colon cancer Mother   . Cancer Father   . Cancer Sister     DRUG ALLERGIES:  No Known Allergies  REVIEW OF SYSTEMS:  Review of Systems  Constitutional: Negative for chills, fever and weight loss.  HENT: Negative for ear discharge, ear pain and nosebleeds.   Eyes: Negative for blurred vision, pain and discharge.  Respiratory: Negative for sputum production, shortness of breath, wheezing and stridor.   Cardiovascular: Negative for chest pain, palpitations, orthopnea and PND.  Gastrointestinal: Negative for abdominal pain, diarrhea, nausea and vomiting.  Genitourinary: Negative for frequency and urgency.  Musculoskeletal: Positive for joint pain. Negative for back pain.  Neurological: Positive for weakness. Negative for sensory change, speech change and focal weakness.  Psychiatric/Behavioral: Negative for depression and hallucinations.  The patient is not nervous/anxious.      MEDICATIONS AT HOME:   Prior to Admission medications   Medication Sig Start Date End Date Taking? Authorizing Provider  atorvastatin (LIPITOR) 40 MG tablet Take 40 mg by mouth daily. 04/19/16  Yes [provider]  budesonide-formoterol (SYMBICORT) 160-4.5 MCG/ACT inhaler Inhale 2 puffs into the lungs daily.   Yes [provider]  furosemide (LASIX) 80 MG tablet Take 80 mg by mouth daily. 05/11/16  Yes [provider]  gabapentin (NEURONTIN) 300 MG capsule Take 1 capsule (300 mg total) by mouth daily. Patient taking differently: Take 300 mg by mouth at bedtime.  03/27/15  Yes Mody, Ulice Bold, MD  labetalol (NORMODYNE) 100 MG tablet Take 1 tablet (100 mg total) by mouth daily. 01/22/16  Yes Epifanio Lesches, MD  Multiple Vitamins-Minerals-FA (DIALYVITE SUPREME D) 3 MG TABS Take 1 tablet by mouth daily. 05/27/16  Yes [provider]  pantoprazole (PROTONIX) 40 MG tablet Take 1 tablet (40 mg total) by mouth daily. 05/10/16  Yes Ghimire, Henreitta Leber, MD  predniSONE (DELTASONE) 20 MG tablet TAKE 3 TABLETS (60MG ) BY MOUTH DAILY 07/07/16  Yes [provider]  sevelamer carbonate (RENVELA) 800 MG tablet Take  3 tablets (2,400 mg total) by mouth 3 (three) times daily with meals. 06/01/16  Yes Theodoro Grist, MD  albuterol (PROVENTIL HFA;VENTOLIN HFA) 108 (90 Base) MCG/ACT inhaler Inhale 4-6 puffs by mouth every 4 hours as needed for wheezing, cough, and/or shortness of breath 07/11/16   Hinda Kehr, MD  amLODipine (NORVASC) 5 MG tablet Take 5 mg by mouth daily.    [provider]  hydrOXYzine (VISTARIL) 25 MG capsule Take 1 capsule (25 mg total) by mouth 3 (three) times daily as needed for itching. 02/25/16   Hagler, Jami L, PA-C  nitroGLYCERIN (NITROSTAT) 0.4 MG SL tablet Place 1 tablet (0.4 mg total) under the tongue every 5 (five) minutes as needed. Patient taking differently: Place 0.4 mg under the tongue every 5  (five) minutes as needed for chest pain.  04/12/16   Wende Bushy, MD      VITAL SIGNS:  Blood pressure 118/77, pulse 82, temperature 98.8 F (37.1 C), temperature source Oral, resp. rate 18, height 6\' 3"  (1.905 m), weight 81.6 kg (180 lb), SpO2 96 %.  PHYSICAL EXAMINATION:  GENERAL:  56 y.o.-year-old patient lying in the bed with no acute distress. Appears chronically ill EYES: Pupils equal, round, reactive to light and accommodation. No scleral icterus. Extraocular muscles intact.  HEENT: Head atraumatic, normocephalic. Oropharynx and nasopharynx clear.  NECK:  Supple, no jugular venous distention. No thyroid enlargement, no tenderness.  LUNGS: Normal breath sounds bilaterally, no wheezing, rales,rhonchi or crepitation. No use of accessory muscles of respiration.  CARDIOVASCULAR: S1, S2 normal. No murmurs, rubs, or gallops.  ABDOMEN: Soft, nontender, nondistended. Bowel sounds present. No organomegaly or mass.  EXTREMITIES: No pedal edema, cyanosis, or clubbing.  NEUROLOGIC: Cranial nerves II through XII are intact. Grossly nonfocal moves all extremities well PSYCHIATRIC: Patient answers questions however he received Ativan earlier appears to be very sleepy. SKIN: No obvious rash, lesion, or ulcer.   LABORATORY PANEL:   CBC  Recent Labs Lab 08/09/16 1110  WBC 19.0*  HGB 5.3*  HCT 18.4*  PLT 414   ------------------------------------------------------------------------------------------------------------------  Chemistries   Recent Labs Lab 08/09/16 1110  NA 139  K 7.4*  CL 100*  CO2 21*  GLUCOSE 76  BUN 105*  CREATININE 11.49*  CALCIUM 9.2  AST 33  ALT 21  ALKPHOS 164*  BILITOT 0.9   ------------------------------------------------------------------------------------------------------------------  Cardiac Enzymes No results for input(s): TROPONINI in the last 168  hours. ------------------------------------------------------------------------------------------------------------------  RADIOLOGY:  Dg Chest Portable 1 View  Result Date: 08/09/2016 CLINICAL DATA:  Leukocytosis. EXAM: PORTABLE CHEST 1 VIEW COMPARISON:  PA and lateral chest 07/11/2016, 07/06/2016 and 02/01/2016. CT chest 03/20/2016. FINDINGS: The patient is status post CABG. There is marked cardiomegaly. No consolidative process, pneumothorax or effusion. Lung volumes are somewhat lower than on the comparison examinations with chronic bronchovascular structures. The patient is slightly rotated on the exam. IMPRESSION: Cardiomegaly without acute disease. Electronically Signed   By: Inge Rise M.D.   On: 08/09/2016 14:24    EKG:    IMPRESSION AND PLAN:   Stanley [name]  is a 56 y.o. male with a known history of Alcohol excess cirrhosis of liver not drinking alcohol anymore according to patient, coronary artery disease, drug abuse, end-stage renal disease on hemodialysis comes to the emergency room after he started having significant leg spasm and "" legs jumping.  1. Acute hyperkalemia secondary to missing hemodialysis on Saturday -Admit to stepdown ICU Patient seen by Dr. [name] nephrology in the emergency room. Stat urgent hemodialysis ordered =  Patient received IV insulin and dextrose in the emergency room -EKG shows no T WAVES -Follow metabolic panel  2. Acute on chronic anemia -Baseline hemoglobin around 8 -Patient had hemoglobin of 4.7 about 2 weeks ago. He got 2 units of blood transfusion and dialysis -Hemoglobin today is 5.3 -He has had several GI luminal evaluation most recent one was a capsule endoscopy which was essentially negative -EGD done in February 2018 showed gastritis -Colonoscopy was attempted however were was unable to complete due to poor prep. GI office has been trying to get in touch with patient to get colonoscopy done -Hemoglobin 5.3----2 unit blood  transfusion today----hemoglobin tomorrow -GI consult placed   3. Hypertension continue home meds   4. Coronary artery disease continue cardiac meds  5. Leukocytosis. So far no source of infection found appears reactive  6. DVT prophylaxis SCD due to GI bleed  Dr. Jamal Collin from ICU has been notified of patient being admitted  No family in the ER     All the records are reviewed and case discussed with ED provider. Management plans discussed with the patient, family and they are in agreement.  CODE STATUS: fu;; TOTAL critical TIME TAKING CARE OF THIS PATIENT: **50 minutes.    Stanley [name] M.D on 08/09/2016 at 3:20 PM  Between 7am to 6pm - Pager - [phone]  After 6pm go to [email or web address] - password [email or web address] [facility]  Office  [phone]  CC: Primary care physician; [name], MD

## 2016-08-09 NOTE — ED Provider Notes (Signed)
[facility] Emergency Department Provider Note  Time seen: 11:17 AM  I have reviewed the triage vital signs and the nursing notes.   HISTORY  Chief Complaint Spasms    HPI Stanley [name] is a 56 y.o. male with a past medical history of alcohol use, cirrhosis, end-stage renal disease on hemodialysis Tuesday, Thursday, Saturday, hypertension, hyperlipidemia, presents to the emergency department for involuntary tremors/jerking movements. According to the patient he took more than his prescribed amount of gabapentin on Saturday because he was having trouble sleeping. Patient states since Saturday he has been having involuntary jerking/tremor movements. He has been unable to eat or dress himself because of the jerking. He states that is why he missed dialysis on Saturday. He was scheduled for dialysis today but could not get ready to go due to the jerking/tremor so he called EMS who brought him to the emergency department. Patient states he has continued to take his normal dose of gabapentin Sunday Monday and today. Patient denies any other medical complaints such as chest pain, abdominal pain, nausea vomiting diarrhea, fever, etc.  Past Medical History:  Diagnosis Date  . Alcohol abuse   . Cirrhosis ([place])   . Coronary artery disease 2009  . Diabetic peripheral neuropathy ([place])   . Drug abuse   . End stage renal disease on dialysis [facility]) NEPHROLOGIST-   DR [facility]  [place]   HEMODIALYSIS --   TUES/  THURS/  SAT  . GERD (gastroesophageal reflux disease)   . Hyperlipidemia   . Hypertension   . PAD (peripheral artery disease) ([place])   . Renal insufficiency    Per pt, 32 oz fluid restriction per day  . S/P triple vessel bypass 06/09/2016   2009ish  . Suicidal ideation    & HOMICIDAL IDEATION --  06-16-2013   ADMITTED TO BEHAVIOR HEALTH    Patient Active Problem List   Diagnosis Date Noted  . Acute respiratory failure with hypoxia ([place]) 07/03/2016  . GI  bleeding 06/24/2016  . Rectal bleeding 06/14/2016  . Anemia of chronic disease 06/01/2016  . MRSA carrier 06/01/2016  . Chronic renal failure 05/23/2016  . Ischemic heart disease 05/23/2016  . Angiodysplasia of small intestine   . Melena   . Small bowel bleed not requiring more than 4 units of blood in 24 hours, ICU, or surgery   . Anemia due to chronic blood loss   . Abdominal pain 05/05/2016  . Acute posthemorrhagic anemia 04/17/2016  . Gastrointestinal bleed 04/17/2016  . History of esophagogastroduodenoscopy (EGD) 04/17/2016  . Elevated troponin 04/17/2016  . Alcohol abuse 04/17/2016  . Upper GI bleed 01/19/2016  . Blood in stool   . Angiodysplasia of stomach and duodenum with hemorrhage   . Gastritis   . Esophagitis, unspecified   . GI bleed 05/16/2015  . Acute GI bleeding   . Symptomatic anemia 04/30/2015  . HTN (hypertension) 04/06/2015  . GERD (gastroesophageal reflux disease) 04/06/2015  . HLD (hyperlipidemia) 04/06/2015  . Dyspnea 04/06/2015  . Cirrhosis of liver with ascites ([place]) 04/06/2015  . Ascites 04/06/2015  . GIB (gastrointestinal bleeding) 03/23/2015  . Homicidal ideation 06/19/2013  . Suicidal intent 06/19/2013  . Homicidal ideations 06/19/2013  . Hyperkalemia 06/16/2013  . Mandible fracture ([place]) 06/05/2013  . Fracture, mandible ([place]) 06/02/2013  . Coronary atherosclerosis of native coronary artery 06/02/2013  . ESRD on dialysis ([place]) 06/02/2013  . Mandible open fracture ([place]) 06/02/2013    Past Surgical History:  Procedure Laterality Date  .  AGILE CAPSULE N/A 06/19/2016   Procedure: AGILE CAPSULE;  Surgeon: [name], MD;  Location: [facility] ENDOSCOPY;  Service: Endoscopy;  Laterality: N/A;  . COLONOSCOPY WITH PROPOFOL N/A 06/18/2016   Procedure: COLONOSCOPY WITH PROPOFOL;  Surgeon: [name], MD;  Location: [facility] ENDOSCOPY;  Service: Endoscopy;  Laterality: N/A;  . CORONARY ANGIOPLASTY  ?   PT UNABLE TO TELL IF  BEFORE OR AFTER  CABG  . CORONARY  ARTERY BYPASS GRAFT  2008  ([place] , [place])   3 VESSEL  . DIALYSIS FISTULA CREATION  LAST SURGERY  APPOX  2008  . ENTEROSCOPY N/A 05/10/2016   Procedure: ENTEROSCOPY;  Surgeon: Jerene Bears, MD;  Location: [place];  Service: Gastroenterology;  Laterality: N/A;  . ESOPHAGOGASTRODUODENOSCOPY N/A 05/07/2015   Procedure: ESOPHAGOGASTRODUODENOSCOPY (EGD);  Surgeon: Hulen Luster, MD;  Location[facility] ENDOSCOPY;  Service: Endoscopy;  Laterality: N/A;  . ESOPHAGOGASTRODUODENOSCOPY (EGD) WITH PROPOFOL N/A 05/17/2015   Procedure: ESOPHAGOGASTRODUODENOSCOPY (EGD) WITH PROPOFOL;  Surgeon: [name], MD;  Location: [facility] ENDOSCOPY;  Service: Endoscopy;  Laterality: N/A;  . ESOPHAGOGASTRODUODENOSCOPY (EGD) WITH PROPOFOL N/A 01/20/2016   Procedure: ESOPHAGOGASTRODUODENOSCOPY (EGD) WITH PROPOFOL;  Surgeon: [name], MD;  Location: [facility] ENDOSCOPY;  Service: Endoscopy;  Laterality: N/A;  . ESOPHAGOGASTRODUODENOSCOPY (EGD) WITH PROPOFOL N/A 04/17/2016   Procedure: ESOPHAGOGASTRODUODENOSCOPY (EGD) WITH PROPOFOL;  Surgeon: Lin Landsman, MD;  Location: [facility] ENDOSCOPY;  Service: Gastroenterology;  Laterality: N/A;  . ESOPHAGOGASTRODUODENOSCOPY (EGD) WITH PROPOFOL  05/09/2016   Procedure: ESOPHAGOGASTRODUODENOSCOPY (EGD) WITH PROPOFOL;  Surgeon: Jerene Bears, MD;  Location: [facility];  Service: Endoscopy;;  . ESOPHAGOGASTRODUODENOSCOPY (EGD) WITH PROPOFOL N/A 06/16/2016   Procedure: ESOPHAGOGASTRODUODENOSCOPY (EGD) WITH PROPOFOL;  Surgeon: [name], MD;  Location: [facility] ENDOSCOPY;  Service: Endoscopy;  Laterality: N/A;  . GIVENS CAPSULE STUDY N/A 05/07/2016   Procedure: GIVENS CAPSULE STUDY;  Surgeon: Doran Stabler, MD;  Location: [place];  Service: Endoscopy;  Laterality: N/A;  . MANDIBULAR HARDWARE REMOVAL N/A 07/29/2013   Procedure: REMOVAL OF ARCH BARS;  Surgeon: Theodoro Kos, DO;  Location: [place];  Service: Plastics;  Laterality: N/A;  . ORIF MANDIBULAR FRACTURE N/A 06/05/2013    Procedure: REPAIR OF MANDIBULAR FRACTURE x 2 with maxillo-mandibular fixation ;  Surgeon: Theodoro Kos, DO;  Location: [place];  Service: Plastics;  Laterality: N/A;  . PERIPHERAL ARTERIAL STENT GRAFT Left     Prior to Admission medications   Medication Sig Start Date End Date Taking? Authorizing Provider  albuterol (PROVENTIL HFA;VENTOLIN HFA) 108 (90 Base) MCG/ACT inhaler Inhale 4-6 puffs by mouth every 4 hours as needed for wheezing, cough, and/or shortness of breath 07/11/16   Hinda Kehr, MD  amLODipine (NORVASC) 5 MG tablet Take 5 mg by mouth daily.    [provider]  atorvastatin (LIPITOR) 40 MG tablet Take 40 mg by mouth daily. 04/19/16   [provider]  azithromycin (ZITHROMAX) 250 MG tablet Take 1 a day for 4 days Patient not taking: Reported on 07/22/2016 07/07/16   Rudene Re, MD  budesonide-formoterol [facility]) 160-4.5 MCG/ACT inhaler Inhale 2 puffs into the lungs daily.    [provider]  furosemide (LASIX) 80 MG tablet Take 80 mg by mouth daily. 05/11/16   [provider]  gabapentin (NEURONTIN) 300 MG capsule Take 1 capsule (300 mg total) by mouth daily. Patient taking differently: Take 300 mg by mouth at bedtime.  03/27/15   Bettey Costa, MD  hydrOXYzine (VISTARIL) 25 MG capsule Take 1 capsule (25 mg total) by mouth  3 (three) times daily as needed for itching. 02/25/16   Hagler, Jami L, PA-C  labetalol (NORMODYNE) 100 MG tablet Take 1 tablet (100 mg total) by mouth daily. 01/22/16   Epifanio Lesches, MD  Multiple Vitamins-Minerals-FA (DIALYVITE SUPREME D) 3 MG TABS Take 1 tablet by mouth daily. 05/27/16   [provider]  mupirocin ointment (BACTROBAN) 2 % Place 1 application into the nose 2 (two) times daily. Patient not taking: Reported on 07/22/2016 06/01/16   Theodoro Grist, MD  naproxen (NAPROSYN) 500 MG tablet Take 500 mg by mouth 2 (two) times daily as needed. for pain 04/28/16   [provider]  nitroGLYCERIN  (NITROSTAT) 0.4 MG SL tablet Place 1 tablet (0.4 mg total) under the tongue every 5 (five) minutes as needed. Patient taking differently: Place 0.4 mg under the tongue every 5 (five) minutes as needed for chest pain.  04/12/16   Wende Bushy, MD  oxyCODONE-acetaminophen (ROXICET) 5-325 MG tablet Take 1-2 tablets by mouth every 4 (four) hours as needed for severe pain. Patient not taking: Reported on 07/22/2016 04/21/16   Hinda Kehr, MD  pantoprazole (PROTONIX) 40 MG tablet Take 1 tablet (40 mg total) by mouth daily. 05/10/16   Ghimire, Henreitta Leber, MD  predniSONE (DELTASONE) 10 MG tablet Take 6 tabs (60 mg) PO x 3 days, then take 4 tabs (40 mg) PO x 3 days, then take 2 tabs (20 mg) PO x 3 days, then take 1 tab (10 mg) PO x 3 days, then take 1/2 tab (5 mg) PO x 4 days. 07/11/16   Hinda Kehr, MD  predniSONE (DELTASONE) 20 MG tablet TAKE 3 TABLETS (60MG ) BY MOUTH DAILY 07/07/16   [provider]  sevelamer carbonate (RENVELA) 800 MG tablet Take 3 tablets (2,400 mg total) by mouth 3 (three) times daily with meals. 06/01/16   Theodoro Grist, MD    No Known Allergies  Family History  Problem Relation Age of Onset  . Colon cancer Mother   . Cancer Father   . Cancer Sister     Social History Social History  Substance Use Topics  . Smoking status: Current Every Day Smoker    Packs/day: 0.15    Years: 40.00    Types: Cigarettes  . Smokeless tobacco: Never Used  . Alcohol use No     Comment: pt reports quitting after learning about cirrhosis    Review of Systems Constitutional: Negative for fever. Cardiovascular: Negative for chest pain. Respiratory: Negative for shortness of breath. Gastrointestinal: Negative for abdominal pain Musculoskeletal: Negative for back pain.Positive for tremor/jerking movements. Neurological: Negative for headache All other ROS negative  ____________________________________________   PHYSICAL EXAM:  VITAL SIGNS: ED Triage Vitals  Enc Vitals Group      BP 08/09/16 1024 115/69     Pulse Rate 08/09/16 1024 86     Resp 08/09/16 1024 (!) 21     Temp 08/09/16 1024 98.8 F (37.1 C)     Temp Source 08/09/16 1024 Oral     SpO2 08/09/16 1021 98 %     Weight 08/09/16 1024 180 lb (81.6 kg)     Height 08/09/16 1024 6\' 3"  (1.905 m)     Head Circumference --      Peak Flow --      Pain Score 08/09/16 1022 9     Pain Loc --      Pain Edu? --      Excl. in [place]? --     Constitutional: Alert, well appearing  but with a tremulous voice with involuntary jerking. Eyes: Normal exam ENT   Head: Normocephalic and atraumatic   Mouth/Throat: Mucous membranes are moist. Cardiovascular: Normal rate, regular rhythm. No murmur Respiratory: Normal respiratory effort without tachypnea nor retractions. Breath sounds are clear Gastrointestinal: Soft and nontender. No distention.   Musculoskeletal: Nontender with normal range of motion in all extremities.  Neurologic:  Tremulous voice, normal language. Patient moves all extremities spontaneously, however with purposeful movement he has jerking-like movements/intention tremor. Tremor appears to resolve upon resting. Skin:  Skin is warm, dry and intact.  Psychiatric: Mood and affect are normal.  ____________________________________________    EKG  EKG reviewed and interpreted by myself shows atrial fibrillation at 85 bpm, widened QRS, right axis deviation, inferior T wave inversions unchanged from 07/10/16, nonspecific ST changes without ST elevation.  ____________________________________________   INITIAL IMPRESSION / ASSESSMENT AND PLAN / ED COURSE  Pertinent labs & imaging results that were available during my care of the patient were reviewed by me and considered in my medical decision making (see chart for details).  Patient presents to the emergency department for involuntary jerking/tremor which started on Saturday after taking more than his prescribed amount of gabapentin per patient. He  also missed dialysis on Saturday and again today. We will check labs and closely monitor the patient.  Patient states some relief after Ativan. Patient's labs are significantly abnormal with an elevated white blood cell count of 19,000. Patient has a hemoglobin of 5.3. We will consent for transfusion. Patient has a potassium of 7.4 likely due to multiple metastatic sessions. We will discuss with nephrology to arrange for dialysis. Given the patient's elevated potassium we will start the patient on insulin and glucose therapy to shift his potassium intracellularly.  Patient will require admission to the hospital.  Patient's rectal exam is guaiac positive. I consented the patient for blood products. We'll order 2 units of packed red blood cells.   CRITICAL CARE Performed by: Harvest Dark   Total critical care time: 60 minutes  Critical care time was exclusive of separately billable procedures and treating other patients.  Critical care was necessary to treat or prevent imminent or life-threatening deterioration.  Critical care was time spent personally by me on the following activities: development of treatment plan with patient and/or surrogate as well as nursing, discussions with consultants, evaluation of patient's response to treatment, examination of patient, obtaining history from patient or surrogate, ordering and performing treatments and interventions, ordering and review of laboratory studies, ordering and review of radiographic studies, pulse oximetry and re-evaluation of patient's condition.   ____________________________________________   FINAL CLINICAL IMPRESSION(S) / ED DIAGNOSES  Involuntary movement  tremor Hyperkalemia Anemia   Harvest Dark, MD 08/09/16 1418

## 2016-08-09 NOTE — Progress Notes (Signed)
  End of hd 

## 2016-08-09 NOTE — Progress Notes (Signed)
Post hd vitals 

## 2016-08-09 NOTE — Progress Notes (Signed)
Post hd assessment 

## 2016-08-09 NOTE — Progress Notes (Signed)
This note also relates to the following rows which could not be included: Resp - Cannot attach notes to unvalidated device data  Hd start

## 2016-08-09 NOTE — ED Notes (Signed)
Ice given x 2 per rn

## 2016-08-09 NOTE — Progress Notes (Signed)
Name: Stanley [name] MRN: [id number] DOB: [birth year]    ADMISSION DATE:  08/09/2016  CHIEF COMPLAINT: "Leg pain"  BRIEF PATIENT DESCRIPTION: 56 year old male  With Alcoholic Cirrhosis,ESRD, acute on chronic anemia and hyperkalemia secondary to missing hemodialysis on Saturday(5/26)  SIGNIFICANT EVENTS  5/29 >>patient admitted to the SDU with hyperkalemia requiring emergent hemodialysis as the patient missed dialysis on 5/26.  STUDIES:  06/28/2016 Echo>> The cavity size was mildly dilated. There was mildconcentric hypertrophy. Systolic function was normal. The estimatedejection fraction was in the range of 50% to 55%.   HISTORY OF PRESENT ILLNESS:  Stanley [name] is a 56 year old male with known history of alcohol abuse, cirrhosis, CAD, diabetic neuropathy, drug abuse, ESRD, GERD, HLD, HTN and PAD. Patient is on hemodialysis and gets dialyzed on Tuesdays Thursdays and Saturday. Patient presented to the ED on 5/29 with leg spasm. Patient stated that he missed his dialysis on Saturday as he was having spasms. His Labs were concerning for critically high potassium of 7.4. Therefore patient was admitted to the ICU for emergent dialysis.  PAST MEDICAL HISTORY :   has a past medical history of Alcohol abuse; Cirrhosis ([place]); Coronary artery disease (2009); Diabetic peripheral neuropathy ([place]); Drug abuse; End stage renal disease on dialysis [facility]) (NEPHROLOGIST-   DR [place]); GERD (gastroesophageal reflux disease); Hyperlipidemia; Hypertension; PAD (peripheral artery disease) ([place]); Renal insufficiency; S/P triple vessel bypass (06/09/2016); and Suicidal ideation.  has a past surgical history that includes Dialysis fistula creation (LAST SURGERY  APPOX  2008); ORIF mandibular fracture (N/A, 06/05/2013); Coronary artery bypass graft (2008  ([place] , [place])); Mandibular hardware removal (N/A, 07/29/2013); Esophagogastroduodenoscopy (N/A, 05/07/2015); Esophagogastroduodenoscopy (egd) with  propofol (N/A, 05/17/2015); Esophagogastroduodenoscopy (egd) with propofol (N/A, 01/20/2016); Coronary angioplasty (?); Peripheral arterial stent graft (Left); Esophagogastroduodenoscopy (egd) with propofol (N/A, 04/17/2016); [place] capsule study (N/A, 05/07/2016); Esophagogastroduodenoscopy (egd) with propofol (05/09/2016); enteroscopy (N/A, 05/10/2016); Esophagogastroduodenoscopy (egd) with propofol (N/A, 06/16/2016); Colonoscopy with propofol (N/A, 06/18/2016); and Agile capsule (N/A, 06/19/2016). Prior to Admission medications   Medication Sig Start Date End Date Taking? Authorizing Provider  atorvastatin (LIPITOR) 40 MG tablet Take 40 mg by mouth daily. 04/19/16  Yes [provider]  budesonide-formoterol (SYMBICORT) 160-4.5 MCG/ACT inhaler Inhale 2 puffs into the lungs daily.   Yes [provider]  furosemide (LASIX) 80 MG tablet Take 80 mg by mouth daily. 05/11/16  Yes [provider]  gabapentin (NEURONTIN) 300 MG capsule Take 1 capsule (300 mg total) by mouth daily. Patient taking differently: Take 300 mg by mouth at bedtime.  03/27/15  Yes Mody, Ulice Bold, MD  labetalol (NORMODYNE) 100 MG tablet Take 1 tablet (100 mg total) by mouth daily. 01/22/16  Yes Epifanio Lesches, MD  Multiple Vitamins-Minerals-FA (DIALYVITE SUPREME D) 3 MG TABS Take 1 tablet by mouth daily. 05/27/16  Yes [provider]  pantoprazole (PROTONIX) 40 MG tablet Take 1 tablet (40 mg total) by mouth daily. 05/10/16  Yes Ghimire, Henreitta Leber, MD  predniSONE (DELTASONE) 20 MG tablet TAKE 3 TABLETS (60MG ) BY MOUTH DAILY 07/07/16  Yes [provider]  sevelamer carbonate (RENVELA) 800 MG tablet Take 3 tablets (2,400 mg total) by mouth 3 (three) times daily with meals. 06/01/16  Yes Theodoro Grist, MD  albuterol (PROVENTIL HFA;VENTOLIN HFA) 108 (90 Base) MCG/ACT inhaler Inhale 4-6 puffs by mouth every 4 hours as needed for wheezing, cough, and/or shortness of breath 07/11/16   Hinda Kehr, MD  amLODipine  (NORVASC) 5 MG tablet Take 5 mg by mouth daily.  [provider]  hydrOXYzine (VISTARIL) 25 MG capsule Take 1 capsule (25 mg total) by mouth 3 (three) times daily as needed for itching. 02/25/16   Hagler, Jami L, PA-C  nitroGLYCERIN (NITROSTAT) 0.4 MG SL tablet Place 1 tablet (0.4 mg total) under the tongue every 5 (five) minutes as needed. Patient taking differently: Place 0.4 mg under the tongue every 5 (five) minutes as needed for chest pain.  04/12/16   Wende Bushy, MD   No Known Allergies  FAMILY HISTORY:  family history includes Cancer in his father and sister; Colon cancer in his mother. SOCIAL HISTORY:  reports that he has been smoking Cigarettes.  He has a 6.00 pack-year smoking history. He has never used smokeless tobacco. He reports that he does not drink alcohol or use drugs.  REVIEW OF SYSTEMS:   Constitutional: Negative for fever, chills, weight loss, malaise/fatigue and diaphoresis.  HENT: Negative for hearing loss, ear pain, nosebleeds, congestion, sore throat, neck pain, tinnitus and ear discharge.   Eyes: Negative for blurred vision, double vision, photophobia, pain, discharge and redness.  Respiratory: Negative for cough, hemoptysis, sputum production, shortness of breath, wheezing and stridor.   Cardiovascular: Negative for chest pain, palpitations, orthopnea, claudication, leg swelling and PND.  Gastrointestinal: Negative for heartburn, nausea, vomiting, abdominal pain, diarrhea, constipation, blood in stool and melena.  Genitourinary: Negative for dysuria, urgency, frequency, hematuria and flank pain.  Musculoskeletal: Negative for myalgias, back pain, joint pain and falls.  Skin: Negative for itching and rash.  Neurological: Negative for dizziness, tingling, tremors, sensory change, speech change, focal weakness, seizures, loss of consciousness, weakness and headaches.  Endo/Heme/Allergies: Negative for environmental allergies and polydipsia. Does not  bruise/bleed easily.  SUBJECTIVE: Patient states that "he is feeling fine"  VITAL SIGNS: Temp:  [98 F (36.7 C)-98.8 F (37.1 C)] 98.2 F (36.8 C) (05/29 2000) Pulse Rate:  [75-101] 87 (05/29 2115) Resp:  [11-23] 21 (05/29 2115) BP: (95-143)/(59-96) 125/71 (05/29 2115) SpO2:  [95 %-100 %] 96 % (05/29 2115) Weight:  [180 lb (81.6 kg)-180 lb 1.9 oz (81.7 kg)] 180 lb 1.9 oz (81.7 kg) (05/29 1835)  PHYSICAL EXAMINATION: General:  Middle-age male, in no acute distress  Neuro:  Awake, alert, oriented  HEENT:AT,[place],No JVD Cardiovascular: S1S2,Regular, no m/r/g noted Lungs:  Clear, no wheezes,crackles,rhonchi noted Abdomen:  Distended,firm,+BS Musculoskeletal: +2 Pitting edema Skin: Warm,dry and Intact   Recent Labs Lab 08/09/16 1110 08/09/16 1959  NA 139  --   K 7.4* 4.4  CL 100*  --   CO2 21*  --   BUN 105*  --   CREATININE 11.49*  --   GLUCOSE 76  --     Recent Labs Lab 08/09/16 1110 08/09/16 1959  HGB 5.3* 6.6*  HCT 18.4* 21.2*  WBC 19.0*  --   PLT 414  --    Dg Chest Portable 1 View  Result Date: 08/09/2016 CLINICAL DATA:  Leukocytosis. EXAM: PORTABLE CHEST 1 VIEW COMPARISON:  PA and lateral chest 07/11/2016, 07/06/2016 and 02/01/2016. CT chest 03/20/2016. FINDINGS: The patient is status post CABG. There is marked cardiomegaly. No consolidative process, pneumothorax or effusion. Lung volumes are somewhat lower than on the comparison examinations with chronic bronchovascular structures. The patient is slightly rotated on the exam. IMPRESSION: Cardiomegaly without acute disease. Electronically Signed   By: Inge Rise M.D.   On: 08/09/2016 14:24    ASSESSMENT / PLAN: Severe Hyperkalemia secondary to missing dialysis Acute on chronic anemia ESRD Hypertension Hx of GERD  Plan -Nephrology  consulted for emergent Hemodialysis -Baseline hemoglobin around 8 -Patient had hemoglobin of 4.7 about 2 weeks ago. He got 2 units of blood transfusion and  dialysis -Hemoglobin today is 5.3 -He has had several GI luminal evaluation most recent one was a capsule endoscopy which was essentially negative -EGD done in February 2018 showed gastritis -Colonoscopy was attempted however were was unable to complete due to poor prep. GI office has been trying to get in touch with patient to get colonoscopy done -2 units of PRBC'S Transfused(5/29) -GI consulted - continue amlodipine/labetalol PRN Hydralazine Continue Protonix   Bincy Varughese,AG-ACNP Pulmonary and [place]  Patient seen and examined with NP, agree with findings, assessment, plan as amended. Patient was admitted after having missed dialysis. He was admitted with leg cramps/spasms, lab testing at that time showed a critically elevated potassium of 7. Physical exam currently the patient is awake and alert, lungs are clear bilaterally. The patient has a known history of alcoholic cirrhosis as well, currently not drinking. I personally reviewed. Chest x-ray images, this shows mild hyperinflation consistent with emphysema, otherwise unremarkable.  Marda Stalker, M.D. 08/10/2016

## 2016-08-10 DIAGNOSIS — K922 Gastrointestinal hemorrhage, unspecified: Secondary | ICD-10-CM

## 2016-08-10 LAB — GLUCOSE, CAPILLARY
GLUCOSE-CAPILLARY: 113 mg/dL — AB (ref 65–99)
GLUCOSE-CAPILLARY: 78 mg/dL (ref 65–99)
GLUCOSE-CAPILLARY: 107 mg/dL — AB (ref 65–99)
Glucose-Capillary: 159 mg/dL — ABNORMAL HIGH (ref 65–99)

## 2016-08-10 LAB — BASIC METABOLIC PANEL
Anion gap: 13 (ref 5–15)
BUN: 56 mg/dL — AB (ref 6–20)
CHLORIDE: 100 mmol/L — AB (ref 101–111)
CO2: 28 mmol/L (ref 22–32)
Calcium: 8.2 mg/dL — ABNORMAL LOW (ref 8.9–10.3)
Creatinine, Ser: 7.07 mg/dL — ABNORMAL HIGH (ref 0.61–1.24)
GFR calc Af Amer: 9 mL/min — ABNORMAL LOW (ref >60)
GFR calc non Af Amer: 8 mL/min — ABNORMAL LOW (ref >60)
Glucose, Bld: 173 mg/dL — ABNORMAL HIGH (ref 65–99)
Potassium: 4.6 mmol/L (ref 3.5–5.1)
Sodium: 141 mmol/L (ref 135–145)

## 2016-08-10 LAB — CBC
HEMATOCRIT: 19.9 % — AB (ref 40.0–52.0)
Hemoglobin: 6.3 g/dL — ABNORMAL LOW (ref 13.0–18.0)
MCH: 26.6 pg (ref 26.0–34.0)
MCHC: 31.8 g/dL — ABNORMAL LOW (ref 32.0–36.0)
MCV: 83.7 fL (ref 80.0–100.0)
PLATELETS: 368 10*3/uL (ref 150–440)
RBC: 2.38 MIL/uL — ABNORMAL LOW (ref 4.40–5.90)
RDW: 17.8 % — AB (ref 11.5–14.5)
WBC: 9.6 10*3/uL (ref 3.8–10.6)

## 2016-08-10 LAB — PROCALCITONIN: Procalcitonin: 2.43 ng/mL

## 2016-08-10 LAB — HEMOGLOBIN AND HEMATOCRIT, BLOOD
HCT: 24.1 % — ABNORMAL LOW (ref 40.0–52.0)
HEMOGLOBIN: 7.7 g/dL — AB (ref 13.0–18.0)

## 2016-08-10 LAB — PREPARE RBC (CROSSMATCH)

## 2016-08-10 MED ORDER — HYDROCODONE-ACETAMINOPHEN 5-325 MG PO TABS
1.0000 | ORAL_TABLET | ORAL | Status: DC | PRN
  Administered 2016-08-10 – 2016-08-11 (×3): 1 via ORAL
  Filled 2016-08-10 (×3): qty 1

## 2016-08-10 MED ORDER — INSULIN ASPART 100 UNIT/ML ~~LOC~~ SOLN: 0.0000 [IU] | Freq: Every day | SUBCUTANEOUS | Status: DC

## 2016-08-10 MED ORDER — SODIUM CHLORIDE 0.9 % IV SOLN: INTRAVENOUS | Status: DC

## 2016-08-10 MED ORDER — SODIUM CHLORIDE 0.9 % IV SOLN
Freq: Once | INTRAVENOUS | Status: AC
  Administered 2016-08-10: 11:00:00 via INTRAVENOUS

## 2016-08-10 MED ORDER — ALBUMIN HUMAN 25 % IV SOLN
12.5000 g | Freq: Once | INTRAVENOUS | Status: DC
  Filled 2016-08-10: qty 50

## 2016-08-10 MED ORDER — INSULIN ASPART 100 UNIT/ML ~~LOC~~ SOLN
0.0000 [IU] | Freq: Three times a day (TID) | SUBCUTANEOUS | Status: DC
  Administered 2016-08-11: 3 [IU] via SUBCUTANEOUS
  Administered 2016-08-11: 2 [IU] via SUBCUTANEOUS
  Filled 2016-08-10: qty 3

## 2016-08-10 MED ORDER — PEG 3350-KCL-NA BICARB-NACL 420 G PO SOLR
4000.0000 mL | Freq: Once | ORAL | Status: AC
  Administered 2016-08-10: 4000 mL via ORAL
  Filled 2016-08-10: qty 4000

## 2016-08-10 NOTE — Progress Notes (Signed)
Pre hd info 

## 2016-08-10 NOTE — Progress Notes (Signed)
Patient states he doesn't want to drink "all of that" bowel prep and someone told him he could try something else that wasn't as much to drink.  Spoke with Dr. Vicente Males and then with patient again and verified this is the best prep for him at this time.  Patient states he will drink it.

## 2016-08-10 NOTE — Progress Notes (Signed)
Hd start 

## 2016-08-10 NOTE — Progress Notes (Signed)
Patient ID: Stanley [name], male   DOB: 1960-09-22, 56 y.o.   MRN: [id number]  [employer] [facility] PROGRESS NOTE  Stanley [name] QIH:[id number] DOB: Feb 17, 1961 DOA: 08/09/2016 PCP: [name], MD  HPI/Subjective: Patient coughing continuously well as in the room. States occasionally brings up brown phlegm. Appetite okay. Came in was feeling sick and had a shaking chill. As per dialysis nurse he missed 2 dialysis prior to coming in and had urgent dialysis last night and another dialysis today.  Objective: Vitals:   08/10/16 1435 08/10/16 1450  BP: (!) 108/52 (!) 112/53  Pulse: 87 86  Resp: 16 13  Temp:      Filed Weights   08/09/16 1024 08/09/16 1835  Weight: 81.6 kg (180 lb) 81.7 kg (180 lb 1.9 oz)    ROS: Review of Systems  Constitutional: Negative for chills and fever.  Eyes: Negative for blurred vision.  Respiratory: Positive for cough. Negative for shortness of breath.   Cardiovascular: Negative for chest pain.  Gastrointestinal: Negative for abdominal pain, constipation, diarrhea, nausea and vomiting.  Genitourinary: Negative for dysuria.  Musculoskeletal: Negative for joint pain.  Neurological: Negative for dizziness and headaches.   Exam: Physical Exam  Constitutional: He is oriented to person, place, and time.  HENT:  Nose: No mucosal edema.  Mouth/Throat: No oropharyngeal exudate or posterior oropharyngeal edema.  Eyes: EOM and lids are normal. Pupils are equal, round, and reactive to light.  Conjunctiva pale  Neck: No JVD present. Carotid bruit is not present. No edema present. No thyroid mass and no thyromegaly present.  Cardiovascular: S1 normal and S2 normal.  Exam reveals no gallop.   No murmur heard. Pulses:      Dorsalis pedis pulses are 2+ on the right side, and 2+ on the left side.  Respiratory: No respiratory distress. He has decreased breath sounds in the right middle field, the right lower field, the left middle field and the left  lower field. He has no wheezes. He has no rhonchi. He has no rales.  GI: Soft. Bowel sounds are normal. There is no tenderness.  Musculoskeletal:       Right ankle: He exhibits swelling.       Left ankle: He exhibits swelling.  Lymphadenopathy:    He has no cervical adenopathy.  Neurological: He is alert and oriented to person, place, and time. No cranial nerve deficit.  Skin: Skin is warm. No rash noted. Nails show no clubbing.  Psychiatric: He has a normal mood and affect.      Data Reviewed: Basic Metabolic Panel:  Recent Labs Lab 08/09/16 1110 08/09/16 1959 08/10/16 0358  NA 139  --  141  K 7.4* 4.4 4.6  CL 100*  --  100*  CO2 21*  --  28  GLUCOSE 76  --  173*  BUN 105*  --  56*  CREATININE 11.49*  --  7.07*  CALCIUM 9.2  --  8.2*   Liver Function Tests:  Recent Labs Lab 08/09/16 1110  AST 33  ALT 21  ALKPHOS 164*  BILITOT 0.9  PROT 6.9  ALBUMIN 3.1*   CBC:  Recent Labs Lab 08/09/16 1110 08/09/16 1959 08/10/16 0358  WBC 19.0*  --  9.6  HGB 5.3* 6.6* 6.3*  HCT 18.4* 21.2* 19.9*  MCV 87.9  --  83.7  PLT 414  --  368   BNP (last 3 results)  Recent Labs  05/30/16 2328 07/03/16 1735  BNP 4,018.0* 3,107.0*  CBG:  Recent Labs Lab 08/09/16 1436 08/09/16 1658 08/10/16 0849  GLUCAP 133* 78 159*      Studies: Dg Chest Portable 1 View  Result Date: 08/09/2016 CLINICAL DATA:  Leukocytosis. EXAM: PORTABLE CHEST 1 VIEW COMPARISON:  PA and lateral chest 07/11/2016, 07/06/2016 and 02/01/2016. CT chest 03/20/2016. FINDINGS: The patient is status post CABG. There is marked cardiomegaly. No consolidative process, pneumothorax or effusion. Lung volumes are somewhat lower than on the comparison examinations with chronic bronchovascular structures. The patient is slightly rotated on the exam. IMPRESSION: Cardiomegaly without acute disease. Electronically Signed   By: Inge Rise M.D.   On: 08/09/2016 14:24    Scheduled Meds: . amLODipine  5 mg  Oral Daily  . atorvastatin  40 mg Oral Daily  . docusate sodium  100 mg Oral BID  . epoetin (EPOGEN/PROCRIT) injection  10,000 Units Intravenous Q T,Th,Sa-HD  . furosemide  80 mg Oral Daily  . gabapentin  300 mg Oral QHS  . labetalol  100 mg Oral Daily  . mometasone-formoterol  2 puff Inhalation BID  . multivitamin  1 tablet Oral Daily  . pantoprazole  40 mg Oral Daily  . polyethylene glycol-electrolytes  4,000 mL Oral Once  . sevelamer carbonate  2,400 mg Oral TID WC   Continuous Infusions: . sodium chloride    . albumin human      Assessment/Plan:  1. Acute on chronic blood loss anemia. Hemoglobin on admission 5.3. Patient being transfused his fourth unit of blood. Nursing staff to draw a hemoglobin after unit of blood finished. Last hemoglobin was 6.3. Gastroenterology to do a colonoscopy on Friday. May end up needing a push endoscopy if the colonoscopy is negative. Case discussed with Hinton Dyer critical care specialist to check a haptoglobin to make sure the patient is not hemolyzing. Would also recommend iron infusion of Venofer 500mg  iv once 2. Acute hyperkalemia secondary to missing 2 dialysis sessions. Had urgent dialysis last night and another dialysis today. 3. Cough and low-grade temperature. Patient also complained of shaking chill. Case discussed with Hinton Dyer critical care specialist order Pro calcitonin,  sputum culture and repeat chest x-ray after dialysis. If spikes higher temperature may need blood cultures. 4. Essential hypertension on Norvasc and labetalol 5. Hyperlipidemia unspecified on atorvastatin 6. History of CAD and PAD 7. GERD on Protonix 8. End-stage renal disease on dialysis 9. History of cirrhosis with ascites  Code Status:     Code Status Orders        Start     Ordered   08/09/16 1700  Full code  Continuous     08/09/16 1659    Code Status History    Date Active Date Inactive Code Status Order ID Comments User Context   07/03/2016  9:54 PM 07/05/2016   9:22 PM Full Code [id number]  Demetrios Loll, MD Inpatient   06/24/2016  4:23 AM 06/26/2016  3:46 PM Full Code [id number]  Saundra Shelling, MD Inpatient   06/14/2016  2:53 PM 06/19/2016  9:04 PM Full Code [id number]  Hillary Bow, MD ED   05/31/2016  6:42 AM 06/01/2016  9:30 PM Full Code [id number]  Saundra Shelling, MD Inpatient   05/05/2016 11:09 PM 05/10/2016  9:57 PM Full Code [id number]  Vianne Bulls, MD Inpatient   04/16/2016  3:56 AM 04/17/2016  6:27 PM Full Code [id number]  Saundra Shelling, MD Inpatient   01/19/2016  3:13 PM 01/22/2016  2:14 PM Full Code [id number]  Loletha Grayer, MD ED   09/24/2015 10:33  AM 09/25/2015  1:31 PM Full Code [id number]  Loletha Grayer, MD ED   05/16/2015  2:17 PM 05/17/2015  8:57 PM Full Code [id number]  Idelle Crouch, MD Inpatient   04/30/2015  8:08 PM 05/08/2015  7:40 PM Full Code [id number]  Vaughan Basta, MD Inpatient   04/06/2015  5:07 AM 04/07/2015  5:36 PM Full Code [id number]  Lance Coon, MD Inpatient   03/23/2015 11:07 AM 03/27/2015 12:46 PM Full Code [id number]  Bettey Costa, MD Inpatient   06/19/2013 11:32 AM 06/21/2013  5:49 PM Full Code [id number]  Cristal Ford, DO Inpatient   06/16/2013  5:26 PM 06/19/2013 11:32 AM Full Code [id number]  Margarita Mail, PA-C ED   06/05/2013  3:33 PM 06/06/2013  6:56 PM Full Code [id number]  Theodoro Kos, DO Inpatient   06/02/2013  9:01 AM 06/05/2013  3:33 PM Full Code [id number]  Velvet Bathe, MD Inpatient   06/02/2013  8:40 AM 06/02/2013  9:01 AM Full Code [id number]  Velvet Bathe, MD Inpatient     Family Communication: As per critical care specialist team Disposition Plan: To be determined  Consultants:  Critical care specialist  Gastroenterology  Time spent: 28 minutes  [place], [place]

## 2016-08-10 NOTE — Progress Notes (Signed)
Pre hd assessment  

## 2016-08-10 NOTE — Progress Notes (Signed)
First unit of PRBC started... Patient tolerating well.  Report given to oncoming shift.

## 2016-08-10 NOTE — Consult Note (Addendum)
[name] MD, MRCP(U.K) [address]  [place]  [place], [place] [address]  Main: [phone]  Fax: [phone]  Consultation  Referring Provider:     Dr Leslye Peer  Primary Care Physician:  [name], MD Primary Gastroenterologist:  Dr. Vicente Males          Reason for Consultation:     Anemia   Date of Admission:  08/09/2016 Date of Consultation:  08/10/2016         HPI:   Stanley [name] is a 56 y.o. male who was recently admitted in 06/2016 with severe anemia, he has been non compliant with follow up , since his discharge has had further drops in Hb with no overt signs of bleeding requiring transfusion , prior work up has not revealed a source of blood loss. He presented to the ER with leg pain , had missed dialysis and was found tohave hyperkalemia and severe acute on chronic anemia of 5.3 grams HB. He has a history of alcoholic liver cirrhosis but has not been drinking any alcohol recently .   He says last few days been having tarry black stool but didn't come into the ER as he says he was not having any transportation. No vomiting or abdominal pain. He says last bowel movement was 2 days back. No NSAID use  Recent work up for GI bleed : capsule study 06/21/16- normal   EGD 06/16/16- normal , colonoscopy , 06/18/16 - poor prep but no source of bleeding seen .  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 This was revealed after a capsule study on 05/10/16 .  04/17/16 -push enteroscopy was normal  01/20/16-normal EGD except for gastritis 04/2015- colonoscopy showed no source of bleeding - very rapid witdrawal time of 3 mins .    Past Medical History:  Diagnosis Date  . Alcohol abuse   . Cirrhosis ([place])   . Coronary artery disease 2009  . Diabetic peripheral neuropathy ([place])   . Drug abuse   . End stage renal disease on dialysis [facility]) NEPHROLOGIST-   [facility]  IN [place]   HEMODIALYSIS --   TUES/  THURS/  SAT  . GERD (gastroesophageal reflux disease)   .  Hyperlipidemia   . Hypertension   . PAD (peripheral artery disease) ([place])   . Renal insufficiency    Per pt, 32 oz fluid restriction per day  . S/P triple vessel bypass 06/09/2016   2009ish  . Suicidal ideation    & HOMICIDAL IDEATION --  06-16-2013   ADMITTED TO BEHAVIOR HEALTH    Past Surgical History:  Procedure Laterality Date  . AGILE CAPSULE N/A 06/19/2016   Procedure: AGILE CAPSULE;  Surgeon: [name], MD;  Location: [facility] ENDOSCOPY;  Service: Endoscopy;  Laterality: N/A;  . COLONOSCOPY WITH PROPOFOL N/A 06/18/2016   Procedure: COLONOSCOPY WITH PROPOFOL;  Surgeon: [name], MD;  Location: [facility] ENDOSCOPY;  Service: Endoscopy;  Laterality: N/A;  . CORONARY ANGIOPLASTY  ?   PT UNABLE TO TELL IF  BEFORE OR AFTER  CABG  . CORONARY ARTERY BYPASS GRAFT  2008  ([place] , [place])   3 VESSEL  . DIALYSIS FISTULA CREATION  LAST SURGERY  APPOX  2008  . ENTEROSCOPY N/A 05/10/2016   Procedure: ENTEROSCOPY;  Surgeon: Jerene Bears, MD;  Location: [place];  Service: Gastroenterology;  Laterality: N/A;  . ESOPHAGOGASTRODUODENOSCOPY N/A 05/07/2015   Procedure: ESOPHAGOGASTRODUODENOSCOPY (EGD);  Surgeon: Hulen Luster, MD;  Location: [facility] ENDOSCOPY;  Service: Endoscopy;  Laterality: N/A;  . ESOPHAGOGASTRODUODENOSCOPY (EGD) WITH PROPOFOL N/A 05/17/2015   Procedure: ESOPHAGOGASTRODUODENOSCOPY (EGD) WITH PROPOFOL;  Surgeon: [name], MD;  Location: [facility] ENDOSCOPY;  Service: Endoscopy;  Laterality: N/A;  . ESOPHAGOGASTRODUODENOSCOPY (EGD) WITH PROPOFOL N/A 01/20/2016   Procedure: ESOPHAGOGASTRODUODENOSCOPY (EGD) WITH PROPOFOL;  Surgeon: [name], MD;  Location: [facility] ENDOSCOPY;  Service: Endoscopy;  Laterality: N/A;  . ESOPHAGOGASTRODUODENOSCOPY (EGD) WITH PROPOFOL N/A 04/17/2016   Procedure: ESOPHAGOGASTRODUODENOSCOPY (EGD) WITH PROPOFOL;  Surgeon: Lin Landsman, MD;  Location: [facility] ENDOSCOPY;  Service: Gastroenterology;  Laterality: N/A;  . ESOPHAGOGASTRODUODENOSCOPY (EGD) WITH PROPOFOL  05/09/2016    Procedure: ESOPHAGOGASTRODUODENOSCOPY (EGD) WITH PROPOFOL;  Surgeon: Jerene Bears, MD;  Location: [place];  Service: Endoscopy;;  . ESOPHAGOGASTRODUODENOSCOPY (EGD) WITH PROPOFOL N/A 06/16/2016   Procedure: ESOPHAGOGASTRODUODENOSCOPY (EGD) WITH PROPOFOL;  Surgeon: [name], MD;  Location: [facility] ENDOSCOPY;  Service: Endoscopy;  Laterality: N/A;  . GIVENS CAPSULE STUDY N/A 05/07/2016   Procedure: GIVENS CAPSULE STUDY;  Surgeon: Doran Stabler, MD;  Location: [place];  Service: Endoscopy;  Laterality: N/A;  . MANDIBULAR HARDWARE REMOVAL N/A 07/29/2013   Procedure: REMOVAL OF ARCH BARS;  Surgeon: Theodoro Kos, DO;  Location: [place];  Service: Plastics;  Laterality: N/A;  . ORIF MANDIBULAR FRACTURE N/A 06/05/2013   Procedure: REPAIR OF MANDIBULAR FRACTURE x 2 with maxillo-mandibular fixation ;  Surgeon: Theodoro Kos, DO;  Location: [place];  Service: Plastics;  Laterality: N/A;  . PERIPHERAL ARTERIAL STENT GRAFT Left     Prior to Admission medications   Medication Sig Start Date End Date Taking? Authorizing Provider  atorvastatin (LIPITOR) 40 MG tablet Take 40 mg by mouth daily. 04/19/16  Yes [provider]  budesonide-formoterol (SYMBICORT) 160-4.5 MCG/ACT inhaler Inhale 2 puffs into the lungs daily.   Yes [provider]  furosemide (LASIX) 80 MG tablet Take 80 mg by mouth daily. 05/11/16  Yes [provider]  gabapentin (NEURONTIN) 300 MG capsule Take 1 capsule (300 mg total) by mouth daily. Patient taking differently: Take 300 mg by mouth at bedtime.  03/27/15  Yes Mody, Ulice Bold, MD  labetalol (NORMODYNE) 100 MG tablet Take 1 tablet (100 mg total) by mouth daily. 01/22/16  Yes Epifanio Lesches, MD  Multiple Vitamins-Minerals-FA (DIALYVITE SUPREME D) 3 MG TABS Take 1 tablet by mouth daily. 05/27/16  Yes [provider]  pantoprazole (PROTONIX) 40 MG tablet Take 1 tablet (40 mg total) by mouth daily. 05/10/16  Yes Ghimire, Henreitta Leber, MD   predniSONE (DELTASONE) 20 MG tablet TAKE 3 TABLETS (60MG ) BY MOUTH DAILY 07/07/16  Yes [provider]  sevelamer carbonate (RENVELA) 800 MG tablet Take 3 tablets (2,400 mg total) by mouth 3 (three) times daily with meals. 06/01/16  Yes Theodoro Grist, MD  albuterol (PROVENTIL HFA;VENTOLIN HFA) 108 (90 Base) MCG/ACT inhaler Inhale 4-6 puffs by mouth every 4 hours as needed for wheezing, cough, and/or shortness of breath 07/11/16   Hinda Kehr, MD  amLODipine (NORVASC) 5 MG tablet Take 5 mg by mouth daily.    [provider]  hydrOXYzine (VISTARIL) 25 MG capsule Take 1 capsule (25 mg total) by mouth 3 (three) times daily as needed for itching. 02/25/16   Hagler, Jami L, PA-C  nitroGLYCERIN (NITROSTAT) 0.4 MG SL tablet Place 1 tablet (0.4 mg total) under the tongue every 5 (five) minutes as needed. Patient taking differently: Place 0.4 mg under the tongue every 5 (five) minutes as needed for chest pain.  04/12/16   Wende Bushy, MD  Family History  Problem Relation Age of Onset  . Colon cancer Mother   . Cancer Father   . Cancer Sister      Social History  Substance Use Topics  . Smoking status: Current Every Day Smoker    Packs/day: 0.15    Years: 40.00    Types: Cigarettes  . Smokeless tobacco: Never Used  . Alcohol use No     Comment: pt reports quitting after learning about cirrhosis    Allergies as of 08/09/2016  . (No Known Allergies)    Review of Systems:    All systems reviewed and negative except where noted in HPI.   Physical Exam:  Vital signs in last 24 hours: Temp:  [98 F (36.7 C)-99.5 F (37.5 C)] 98.8 F (37.1 C) (05/30 0800) Pulse Rate:  [75-101] 89 (05/30 0900) Resp:  [10-23] 16 (05/30 0900) BP: (89-143)/(51-96) 126/71 (05/30 0900) SpO2:  [83 %-100 %] 99 % (05/30 0900) Weight:  [180 lb (81.6 kg)-180 lb 1.9 oz (81.7 kg)] 180 lb 1.9 oz (81.7 kg) (05/29 1835) Last BM Date: 08/09/16 General:   Pleasant, cooperative in NAD Head:   Normocephalic and atraumatic. Eyes:   No icterus.   Conjunctiva pink. PERRLA. Ears:  Normal auditory acuity. Neck:  Supple; no masses or thyroidomegaly Lungs: Respirations even and unlabored. Lungs clear to auscultation bilaterally.   No wheezes, crackles, or rhonchi.  Heart:  Regular rate and rhythm;  Without murmur, clicks, rubs or gallops Abdomen:  Soft, nondistended, nontender. Normal bowel sounds. No appreciable masses or hepatomegaly.  No rebound or guarding.  Rectal:  Not performed. Neurologic:  Alert and oriented x3;  grossly normal neurologically. Skin:  Intact without significant lesions or rashes. Cervical Nodes:  No significant cervical adenopathy. Psych:  Alert and cooperative. Normal affect.  LAB RESULTS:  Recent Labs  08/09/16 1110 08/09/16 1959 08/10/16 0358  WBC 19.0*  --  9.6  HGB 5.3* 6.6* 6.3*  HCT 18.4* 21.2* 19.9*  PLT 414  --  368   BMET  Recent Labs  08/09/16 1110 08/09/16 1959 08/10/16 0358  NA 139  --  141  K 7.4* 4.4 4.6  CL 100*  --  100*  CO2 21*  --  28  GLUCOSE 76  --  173*  BUN 105*  --  56*  CREATININE 11.49*  --  7.07*  CALCIUM 9.2  --  8.2*   LFT  Recent Labs  08/09/16 1110  PROT 6.9  ALBUMIN 3.1*  AST 33  ALT 21  ALKPHOS 164*  BILITOT 0.9   PT/INR No results for input(s): LABPROT, INR in the last 72 hours.  STUDIES: Dg Chest Portable 1 View  Result Date: 08/09/2016 CLINICAL DATA:  Leukocytosis. EXAM: PORTABLE CHEST 1 VIEW COMPARISON:  PA and lateral chest 07/11/2016, 07/06/2016 and 02/01/2016. CT chest 03/20/2016. FINDINGS: The patient is status post CABG. There is marked cardiomegaly. No consolidative process, pneumothorax or effusion. Lung volumes are somewhat lower than on the comparison examinations with chronic bronchovascular structures. The patient is slightly rotated on the exam. IMPRESSION: Cardiomegaly without acute disease. Electronically Signed   By: Inge Rise M.D.   On: 08/09/2016 14:24       Impression / Plan:   Stanley [name] is a 56 y.o. y/o male with a history of  Overt obscure GI bleeding evaluated in the past  , prior small avm of the proximal jejunum. He has had multiple EGD/push enteroscopies in the past , 2 times VCE and  1 recent poor prep colonoscopy. Last push enteroscopy had a jejunal AVM that was blatedHe comes in this time with a further drop in Hb .   Plan  1. Transfuse and monitor CBC 2. Plan for colonoscopy on Friday with a 2 day prep starting today - in 06/2016 despite a regular bowel prep he was not clean . After colonoscopy if no AVM's seen may need to consider a repeat PUSH enteroscopy , if both negative suggest he undergo balloon enteroscopy at [facility] as he has had multiple visits for blood transfusion and he has been non compliant.  3. PPI 4. May be worth also checking for hemolysis although his T bilirubin has been normal .   I have discussed alternative options, risks & benefits,  which include, but are not limited to, bleeding, infection, perforation,respiratory complication & drug reaction.  The patient agrees with this plan & written consent will be obtained.    Thank you for involving me in the care of this patient.      LOS: 1 day   [name], MD  08/10/2016, 9:34 AM

## 2016-08-10 NOTE — Progress Notes (Signed)
Patient alert and has a large appetite this shift. Completed dialysis and 3L was removed from patient. Denies pain at present time.

## 2016-08-10 NOTE — Progress Notes (Signed)
Post hd vitals 

## 2016-08-10 NOTE — Progress Notes (Signed)
Post hd assessment 

## 2016-08-10 NOTE — Progress Notes (Signed)
Patient refusing bed alarm 

## 2016-08-10 NOTE — Progress Notes (Signed)
[facility], [place] 08/10/16  Subjective:   Patient presented to the emergency room for abdominal distention He was dialyzed emergently yesterday for high potassium.  Potassium level is now within normal range Patient continues to have abdominal distention and leg edema  Objective:  Vital signs in last 24 hours:  Temp:  [98 F (36.7 C)-99.5 F (37.5 C)] 99.2 F (37.3 C) (05/30 1405) Pulse Rate:  [75-101] 84 (05/30 1535) Resp:  [10-23] 13 (05/30 1535) BP: (89-143)/(46-96) 96/49 (05/30 1535) SpO2:  [73 %-100 %] 99 % (05/30 1115) Weight:  [81.7 kg (180 lb 1.9 oz)] 81.7 kg (180 lb 1.9 oz) (05/29 1835)  Weight change:  Filed Weights   08/09/16 1024 08/09/16 1835  Weight: 81.6 kg (180 lb) 81.7 kg (180 lb 1.9 oz)    Intake/Output:    Intake/Output Summary (Last 24 hours) at 08/10/16 1538 Last data filed at 08/10/16 1130  Gross per 24 hour  Intake             1407 ml  Output             3125 ml  Net            -1718 ml     Physical Exam: General: No acute distress, laying in the bed  HEENT Anicteric, dry oral mucous membranes  Neck supple  Pulm/lungs Normal breathing, no crackles  CVS/Heart Regular, no rub  Abdomen:  Distended, ascites  Extremities: 2-3+ pitting edema bilaterally  Neurologic: Alert, oriented. able to answer questions  Skin: No acute rashes  Access: AV fistula       Basic Metabolic Panel:   Recent Labs Lab 08/09/16 1110 08/09/16 1959 08/10/16 0358  NA 139  --  141  K 7.4* 4.4 4.6  CL 100*  --  100*  CO2 21*  --  28  GLUCOSE 76  --  173*  BUN 105*  --  56*  CREATININE 11.49*  --  7.07*  CALCIUM 9.2  --  8.2*     CBC:  Recent Labs Lab 08/09/16 1110 08/09/16 1959 08/10/16 0358  WBC 19.0*  --  9.6  HGB 5.3* 6.6* 6.3*  HCT 18.4* 21.2* 19.9*  MCV 87.9  --  83.7  PLT 414  --  368      Lab Results  Component Value Date   HEPBSAG Negative 06/01/2016   HEPBSAB Reactive 06/01/2016   HEPBIGM Negative  06/01/2016      Microbiology:  No results found for this or any previous visit (from the past 240 hour(s)).  Coagulation Studies: No results for input(s): LABPROT, INR in the last 72 hours.  Urinalysis: No results for input(s): COLORURINE, LABSPEC, PHURINE, GLUCOSEU, HGBUR, BILIRUBINUR, KETONESUR, PROTEINUR, UROBILINOGEN, NITRITE, LEUKOCYTESUR in the last 72 hours.  Invalid input(s): APPERANCEUR    Imaging: Dg Chest Portable 1 View  Result Date: 08/09/2016 CLINICAL DATA:  Leukocytosis. EXAM: PORTABLE CHEST 1 VIEW COMPARISON:  PA and lateral chest 07/11/2016, 07/06/2016 and 02/01/2016. CT chest 03/20/2016. FINDINGS: The patient is status post CABG. There is marked cardiomegaly. No consolidative process, pneumothorax or effusion. Lung volumes are somewhat lower than on the comparison examinations with chronic bronchovascular structures. The patient is slightly rotated on the exam. IMPRESSION: Cardiomegaly without acute disease. Electronically Signed   By: Inge Rise M.D.   On: 08/09/2016 14:24     Medications:   . sodium chloride    . albumin human     . amLODipine  5 mg Oral  Daily  . atorvastatin  40 mg Oral Daily  . docusate sodium  100 mg Oral BID  . epoetin (EPOGEN/PROCRIT) injection  10,000 Units Intravenous Q T,Th,Sa-HD  . furosemide  80 mg Oral Daily  . gabapentin  300 mg Oral QHS  . labetalol  100 mg Oral Daily  . mometasone-formoterol  2 puff Inhalation BID  . multivitamin  1 tablet Oral Daily  . pantoprazole  40 mg Oral Daily  . polyethylene glycol-electrolytes  4,000 mL Oral Once  . sevelamer carbonate  2,400 mg Oral TID WC     Assessment/ Plan:  56 y.o. male with ESRD on hemodialysis, hypertension, peripheral vascular disease, coronary artery disease, cirrhosis, substance abuse, recurrent GI bleed, admission for SOB 07/03/16.  TTS [facility] Right arm AVF   1. End stage renal disease: TTS. Patient due for hemodialysis today. Orders have been  prepared. Continue dialysis on a TTS schedule. will dialyze him today for volume removal.  He is about to start a prep for endoscopy.  He'll be free for the endoscopy procedure tomorrow as his dialysis is done today  2. Hypertension: Blood pressure low normal - will monitor  3. Anemia of chronic kidney disease with recent GI blood loss:  - hemoglobin low at 6.3.   - Procrit with dialysis - endoscopy planned tomorrow  4. Secondary Hyperparathyroidism:  - monitor phos  continue current dosage of Renvela once able to take PO  5. Severe hyperkalemia Urgent hemodialysis 5/29 improved  6.  Lower extremity edema - Ultrafiltration with dialysis as tolerated 3000 cc removed on 5/29   LOS: [place] [id number]/30/20183:[id number] [place], [place]

## 2016-08-10 NOTE — Progress Notes (Signed)
Transferred to [id number] via wheelchair.

## 2016-08-10 NOTE — Progress Notes (Signed)
Called Dr. Jannifer Franklin regarding pain medication per patient request.  Appropriate orders were placed.  Christene Slates  08/10/2016  9:06 PM

## 2016-08-10 NOTE — Progress Notes (Signed)
Report called to [facility] on [facility]

## 2016-08-10 NOTE — Progress Notes (Signed)
  End of hd 

## 2016-08-10 NOTE — Progress Notes (Signed)
Good day. Had HD today as well as 2 units of blood. Patient still concerned about swollen left ankle.Talked with Dr. Juanell Fairly about swollen ankle and ultrasound was ordered. Refused bowel prep and colonoscopy for tomorrow. No permit was signed for procedure. More alert and talkative this pm. VSS. Afebrile today.

## 2016-08-11 ENCOUNTER — Inpatient Hospital Stay: Payer: Medicare Other

## 2016-08-11 DIAGNOSIS — N186 End stage renal disease: Secondary | ICD-10-CM | POA: Diagnosis not present

## 2016-08-11 DIAGNOSIS — Z992 Dependence on renal dialysis: Secondary | ICD-10-CM | POA: Diagnosis not present

## 2016-08-11 LAB — BPAM RBC
BLOOD PRODUCT EXPIRATION DATE: 201806022359
BLOOD PRODUCT EXPIRATION DATE: 201806192359
Blood Product Expiration Date: 201806022359
Blood Product Expiration Date: 201806022359
Blood Product Expiration Date: 201806202359
ISSUE DATE / TIME: 201805291604
ISSUE DATE / TIME: 201805291829
ISSUE DATE / TIME: 201805300651
ISSUE DATE / TIME: 201805301117
UNIT TYPE AND RH: 5100
UNIT TYPE AND RH: 5100
UNIT TYPE AND RH: 6200
Unit Type and Rh: 6200
Unit Type and Rh: 6200

## 2016-08-11 LAB — TYPE AND SCREEN
ABO/RH(D): A POS
ANTIBODY SCREEN: NEGATIVE
UNIT DIVISION: 0
UNIT DIVISION: 0
UNIT DIVISION: 0
UNIT DIVISION: 0
Unit division: 0

## 2016-08-11 LAB — BASIC METABOLIC PANEL
ANION GAP: 11 (ref 5–15)
BUN: 30 mg/dL — ABNORMAL HIGH (ref 6–20)
CALCIUM: 7.9 mg/dL — AB (ref 8.9–10.3)
CO2: 31 mmol/L (ref 22–32)
Chloride: 95 mmol/L — ABNORMAL LOW (ref 101–111)
Creatinine, Ser: 4.93 mg/dL — ABNORMAL HIGH (ref 0.61–1.24)
GFR, EST AFRICAN AMERICAN: 14 mL/min — AB (ref >60)
GFR, EST NON AFRICAN AMERICAN: 12 mL/min — AB (ref >60)
GLUCOSE: 100 mg/dL — AB (ref 65–99)
Potassium: 3.6 mmol/L (ref 3.5–5.1)
Sodium: 137 mmol/L (ref 135–145)

## 2016-08-11 LAB — CBC
HCT: 24 % — ABNORMAL LOW (ref 40.0–52.0)
Hemoglobin: 7.7 g/dL — ABNORMAL LOW (ref 13.0–18.0)
MCH: 27.2 pg (ref 26.0–34.0)
MCHC: 32.3 g/dL (ref 32.0–36.0)
MCV: 84.2 fL (ref 80.0–100.0)
PLATELETS: 297 10*3/uL (ref 150–440)
RBC: 2.85 MIL/uL — ABNORMAL LOW (ref 4.40–5.90)
RDW: 16.5 % — AB (ref 11.5–14.5)
WBC: 9 10*3/uL (ref 3.8–10.6)

## 2016-08-11 LAB — GLUCOSE, CAPILLARY
GLUCOSE-CAPILLARY: 217 mg/dL — AB (ref 65–99)
GLUCOSE-CAPILLARY: 177 mg/dL — AB (ref 65–99)
Glucose-Capillary: 94 mg/dL (ref 65–99)
Glucose-Capillary: 114 mg/dL — ABNORMAL HIGH (ref 65–99)

## 2016-08-11 LAB — MRSA PCR SCREENING: MRSA by PCR: NEGATIVE

## 2016-08-11 LAB — HEPATITIS B SURFACE ANTIGEN: HEP B S AG: NEGATIVE

## 2016-08-11 MED ORDER — ALBUMIN HUMAN 25 % IV SOLN
12.5000 g | Freq: Once | INTRAVENOUS | Status: DC
  Filled 2016-08-11: qty 50

## 2016-08-11 MED ORDER — SODIUM CHLORIDE 0.9 % IV SOLN
400.0000 mg | Freq: Once | INTRAVENOUS | Status: AC
  Administered 2016-08-11: 400 mg via INTRAVENOUS
  Filled 2016-08-11: qty 20

## 2016-08-11 MED ORDER — HYDROCODONE-ACETAMINOPHEN 5-325 MG PO TABS
1.0000 | ORAL_TABLET | ORAL | Status: DC | PRN
  Administered 2016-08-11 – 2016-08-12 (×4): 1 via ORAL
  Filled 2016-08-11 (×2): qty 1
  Filled 2016-08-11: qty 2
  Filled 2016-08-11: qty 1

## 2016-08-11 MED ORDER — METHYLPREDNISOLONE SODIUM SUCC 40 MG IJ SOLR
40.0000 mg | Freq: Every day | INTRAMUSCULAR | Status: DC
  Administered 2016-08-11 – 2016-08-12 (×2): 40 mg via INTRAVENOUS
  Filled 2016-08-11 (×2): qty 1

## 2016-08-11 MED ORDER — IPRATROPIUM-ALBUTEROL 0.5-2.5 (3) MG/3ML IN SOLN
3.0000 mL | Freq: Four times a day (QID) | RESPIRATORY_TRACT | Status: DC
  Administered 2016-08-11 – 2016-08-12 (×4): 3 mL via RESPIRATORY_TRACT
  Filled 2016-08-11 (×7): qty 3

## 2016-08-11 MED ORDER — CEFTRIAXONE SODIUM 2 G IJ SOLR
2.0000 g | INTRAMUSCULAR | Status: DC
  Administered 2016-08-11 – 2016-08-12 (×2): 2 g via INTRAVENOUS
  Filled 2016-08-11 (×3): qty 2

## 2016-08-11 MED ORDER — PEG 3350-KCL-NA BICARB-NACL 420 G PO SOLR
4000.0000 mL | Freq: Once | ORAL | Status: AC
  Administered 2016-08-11: 4000 mL via ORAL
  Filled 2016-08-11: qty 4000

## 2016-08-11 NOTE — Progress Notes (Signed)
Pharmacy Antibiotic Note  Zackry Deines is a 56 y.o. male admitted on 08/09/2016 with  cellulitis and COPD exacerbation with elevated procalcitonin.  Pharmacy has been consulted for ceftriaxone dosing.  Plan: Will start patient on ceftriaxone 2g IV every 24 hours.   Height: 6\' 3"  (190.5 cm) Weight: 180 lb 1.9 oz (81.7 kg) IBW/kg (Calculated) : 84.5  Temp (24hrs), Avg:98.8 F (37.1 C), Min:98.1 F (36.7 C), Max:99.2 F (37.3 C)   Recent Labs Lab 08/09/16 1110 08/10/16 0358 08/11/16 0432  WBC 19.0* 9.6 9.0  CREATININE 11.49* 7.07* 4.93*    Estimated Creatinine Clearance: 19.1 mL/min (A) (by C-G formula based on SCr of 4.93 mg/dL (H)).    No Known Allergies  Antimicrobials this admission: 5/31 Ceftriaxone>>  Dose adjustments this admission:  Microbiology results:  Thank you for allowing pharmacy to be a part of this patient's care.  Pernell Dupre, PharmD, BCPS Clinical Pharmacist 08/11/2016 8:30 AM

## 2016-08-11 NOTE — Progress Notes (Signed)
Patient ID: Stanley [name], male   DOB: February 06, 1961, 56 y.o.   MRN: [id number]  [facility] PROGRESS NOTE  Stanley [name] YQI:[id number] DOB: Oct 10, 1960 DOA: 08/09/2016 PCP: [name], MD  HPI/Subjective: Patient feels okay. Still having cough. Left Leg feels all swollen a Stanley painful. Patient states he's been having black stools but now turned clear after the prep.  Objective: Vitals:   08/11/16 1350 08/11/16 1400  BP: 131/76 123/75  Pulse: 90 93  Resp: 14 16  Temp:      Filed Weights   08/09/16 1024 08/09/16 1835 08/11/16 1310  Weight: 81.6 kg (180 lb) 81.7 kg (180 lb 1.9 oz) 81.7 kg (180 lb 1.9 oz)    ROS: Review of Systems  Constitutional: Negative for chills and fever.  Eyes: Negative for blurred vision.  Respiratory: Positive for cough. Negative for shortness of breath.   Cardiovascular: Negative for chest pain.  Gastrointestinal: Negative for abdominal pain, constipation, diarrhea, nausea and vomiting.  Genitourinary: Negative for dysuria.  Musculoskeletal: Negative for joint pain.  Neurological: Negative for dizziness and headaches.   Exam: Physical Exam  Constitutional: He is oriented to person, place, and time.  HENT:  Nose: No mucosal edema.  Mouth/Throat: No oropharyngeal exudate or posterior oropharyngeal edema.  Eyes: EOM and lids are normal. Pupils are equal, round, and reactive to light.  Conjunctiva pale  Neck: No JVD present. Carotid bruit is not present. No edema present. No thyroid mass and no thyromegaly present.  Cardiovascular: S1 normal and S2 normal.  Exam reveals no gallop.   No murmur heard. Pulses:      Dorsalis pedis pulses are 2+ on the right side, and 2+ on the left side.  Respiratory: No respiratory distress. He has decreased breath sounds in the right lower field, the left middle field and the left lower field. He has wheezes in the right middle field, the right lower field and the left middle field. He has no  rhonchi. He has no rales.  GI: Soft. Bowel sounds are normal. There is no tenderness.  Musculoskeletal:       Right ankle: He exhibits swelling.       Left ankle: He exhibits swelling.  Lymphadenopathy:    He has no cervical adenopathy.  Neurological: He is alert and oriented to person, place, and time. No cranial nerve deficit.  Skin: Skin is warm. No rash noted. Nails show no clubbing.  Psychiatric: He has a normal mood and affect.      Data Reviewed: Basic Metabolic Panel:  Recent Labs Lab 08/09/16 1110 08/09/16 1959 08/10/16 0358 08/11/16 0432  NA 139  --  141 137  K 7.4* 4.4 4.6 3.6  CL 100*  --  100* 95*  CO2 21*  --  28 31  GLUCOSE 76  --  173* 100*  BUN 105*  --  56* 30*  CREATININE 11.49*  --  7.07* 4.93*  CALCIUM 9.2  --  8.2* 7.9*   Liver Function Tests:  Recent Labs Lab 08/09/16 1110  AST 33  ALT 21  ALKPHOS 164*  BILITOT 0.9  PROT 6.9  ALBUMIN 3.1*   CBC:  Recent Labs Lab 08/09/16 1110 08/09/16 1959 08/10/16 0358 08/10/16 1638 08/11/16 0432  WBC 19.0*  --  9.6  --  9.0  HGB 5.3* 6.6* 6.3* 7.7* 7.7*  HCT 18.4* 21.2* 19.9* 24.1* 24.0*  MCV 87.9  --  83.7  --  84.2  PLT 414  --  368  --  297   BNP (last 3 results)  Recent Labs  05/30/16 2328 07/03/16 1735  BNP 4,018.0* 3,107.0*     CBG:  Recent Labs Lab 08/10/16 1619 08/10/16 2131 08/10/16 2230 08/11/16 0758 08/11/16 1214  GLUCAP 113* 78 107* 94 217*      Studies: US Venous Img Lower Unilateral Left  Result Date: 08/11/2016 CLINICAL DATA:  Left lower extremity edema for 2 months. EXAM: Left LOWER EXTREMITY VENOUS DOPPLER ULTRASOUND TECHNIQUE: Gray-scale sonography with graded compression, as well as color Doppler and duplex ultrasound were performed to evaluate the lower extremity deep venous systems from the level of the common femoral vein and including the common femoral, femoral, profunda femoral, popliteal and calf veins including the posterior tibial, peroneal and  gastrocnemius veins when visible. The superficial great saphenous vein was also interrogated. Spectral Doppler was utilized to evaluate flow at rest and with distal augmentation maneuvers in the common femoral, femoral and popliteal veins. COMPARISON:  Ultrasound of April 16, 2016. FINDINGS: Contralateral Common Femoral Vein: Respiratory phasicity is normal and symmetric with the symptomatic side. No evidence of thrombus. Normal compressibility. Common Femoral Vein: No evidence of thrombus. Normal compressibility, respiratory phasicity and response to augmentation. Saphenofemoral Junction: No evidence of thrombus. Normal compressibility and flow on color Doppler imaging. Profunda Femoral Vein: No evidence of thrombus. Normal compressibility and flow on color Doppler imaging. Femoral Vein: No evidence of thrombus. Normal compressibility, respiratory phasicity and response to augmentation. Popliteal Vein: No evidence of thrombus. Normal compressibility, respiratory phasicity and response to augmentation. Calf Veins: No evidence of thrombus. Normal compressibility and flow on color Doppler imaging. Superficial Great Saphenous Vein: No evidence of thrombus. Normal compressibility and flow on color Doppler imaging. Venous Reflux:  None. Other Findings: Edema is noted in the soft tissues of the left calf. IMPRESSION: No evidence of DVT within the left lower extremity. Electronically Signed   By: Marijo Conception, M.D.   On: 08/11/2016 11:38    Scheduled Meds: . atorvastatin  40 mg Oral Daily  . docusate sodium  100 mg Oral BID  . epoetin (EPOGEN/PROCRIT) injection  10,000 Units Intravenous Q T,Th,Sa-HD  . furosemide  80 mg Oral Daily  . gabapentin  300 mg Oral QHS  . insulin aspart  0-5 Units Subcutaneous QHS  . insulin aspart  0-9 Units Subcutaneous TID WC  . ipratropium-albuterol  3 mL Nebulization Q6H  . labetalol  100 mg Oral Daily  . methylPREDNISolone (SOLU-MEDROL) injection  40 mg Intravenous Q  breakfast  . mometasone-formoterol  2 puff Inhalation BID  . multivitamin  1 tablet Oral Daily  . pantoprazole  40 mg Oral Daily  . sevelamer carbonate  2,400 mg Oral TID WC   Continuous Infusions: . sodium chloride    . albumin human    . albumin human    . cefTRIAXone (ROCEPHIN)  IV Stopped (08/11/16 1210)  . iron sucrose      Assessment/Plan:  1. Acute on chronic blood loss anemia. Hemoglobin on admission 5.3. Patient received 4 units of blood and last hemoglobin is 7.7.  Gastroenterology to do a colonoscopy on Friday. May end up needing a push endoscopy if the colonoscopy is negative. Check a haptoglobin. Give IV Venofer today. 2. Acute hyperkalemia secondary to missing dialysis. Received dialysis here in hyperkalemia has improved. 3. Cellulitis left lower extremity and COPD exacerbation. Start empiric Rocephin and Solu-Medrol patient has a Cough and low-grade temperature. Pro calcitonin is and also elevated 4. Essential hypertension on Norvasc and  labetalol 5. Hyperlipidemia unspecified on atorvastatin 6. History of CAD and PAD 7. GERD on Protonix 8. End-stage renal disease on dialysis 9. History of cirrhosis with ascites  Code Status:     Code Status Orders        Start     Ordered   08/09/16 1700  Full code  Continuous     08/09/16 1659    Code Status History    Date Active Date Inactive Code Status Order ID Comments User Context   07/03/2016  9:54 PM 07/05/2016  9:22 PM Full Code [id number]  Demetrios Loll, MD Inpatient   06/24/2016  4:23 AM 06/26/2016  3:46 PM Full Code [id number]  Saundra Shelling, MD Inpatient   06/14/2016  2:53 PM 06/19/2016  9:04 PM Full Code [id number]  Hillary Bow, MD ED   05/31/2016  6:42 AM 06/01/2016  9:30 PM Full Code [id number]  Saundra Shelling, MD Inpatient   05/05/2016 11:09 PM 05/10/2016  9:57 PM Full Code [id number]  Vianne Bulls, MD Inpatient   04/16/2016  3:56 AM 04/17/2016  6:27 PM Full Code [id number]  Saundra Shelling, MD Inpatient   01/19/2016  3:13  PM 01/22/2016  2:14 PM Full Code [id number]  Loletha Grayer, MD ED   09/24/2015 10:33 AM 09/25/2015  1:31 PM Full Code [id number]  Loletha Grayer, MD ED   05/16/2015  2:17 PM 05/17/2015  8:57 PM Full Code [id number]  Idelle Crouch, MD Inpatient   04/30/2015  8:08 PM 05/08/2015  7:40 PM Full Code [id number]  Vaughan Basta, MD Inpatient   04/06/2015  5:07 AM 04/07/2015  5:36 PM Full Code [id number]  Lance Coon, MD Inpatient   03/23/2015 11:07 AM 03/27/2015 12:46 PM Full Code [id number]  Bettey Costa, MD Inpatient   06/19/2013 11:32 AM 06/21/2013  5:49 PM Full Code [id number]  Cristal Ford, DO Inpatient   06/16/2013  5:26 PM 06/19/2013 11:32 AM Full Code [id number]  Margarita Mail, PA-C ED   06/05/2013  3:33 PM 06/06/2013  6:56 PM Full Code [id number]  Theodoro Kos, DO Inpatient   06/02/2013  9:01 AM 06/05/2013  3:33 PM Full Code [id number]  Velvet Bathe, MD Inpatient   06/02/2013  8:40 AM 06/02/2013  9:01 AM Full Code [id number]  Velvet Bathe, MD Inpatient     Family Communication: As per critical care specialist team Disposition Plan: To be determined  Consultants:  Critical care specialist  Gastroenterology  Time spent: 26 minutes  [place], [place]

## 2016-08-11 NOTE — Progress Notes (Signed)
[facility], [place] 08/11/16  Subjective:   Patient presented to the emergency room for abdominal distention He was dialyzed emergently for high potassium.  Potassium level is now within normal range Patient continues to have abdominal distention and leg edema So far about 6000 cc of fluid has been removed  Objective:  Vital signs in last 24 hours:  Temp:  [97.8 F (36.6 C)-99.2 F (37.3 C)] 97.8 F (36.6 C) (05/31 1245) Pulse Rate:  [81-88] 85 (05/31 1245) Resp:  [1-20] 20 (05/31 1245) BP: (90-126)/(41-67) 126/67 (05/31 1245) SpO2:  [98 %-100 %] 100 % (05/31 1245)  Weight change:  Filed Weights   08/09/16 1024 08/09/16 1835  Weight: 81.6 kg (180 lb) 81.7 kg (180 lb 1.9 oz)    Intake/Output:    Intake/Output Summary (Last 24 hours) at 08/11/16 1340 Last data filed at 08/11/16 0905  Gross per 24 hour  Intake             1160 ml  Output             3000 ml  Net            -1840 ml     Physical Exam: General: No acute distress, laying in the bed  HEENT Anicteric, dry oral mucous membranes  Neck supple  Pulm/lungs Normal breathing, no crackles  CVS/Heart Regular, no rub  Abdomen:  Distended, ascites  Extremities: 2-3+ pitting edema bilaterally  Neurologic: Alert, oriented. able to answer questions  Skin: No acute rashes  Access: AV fistula       Basic Metabolic Panel:   Recent Labs Lab 08/09/16 1110 08/09/16 1959 08/10/16 0358 08/11/16 0432  NA 139  --  141 137  K 7.4* 4.4 4.6 3.6  CL 100*  --  100* 95*  CO2 21*  --  28 31  GLUCOSE 76  --  173* 100*  BUN 105*  --  56* 30*  CREATININE 11.49*  --  7.07* 4.93*  CALCIUM 9.2  --  8.2* 7.9*     CBC:  Recent Labs Lab 08/09/16 1110 08/09/16 1959 08/10/16 0358 08/10/16 1638 08/11/16 0432  WBC 19.0*  --  9.6  --  9.0  HGB 5.3* 6.6* 6.3* 7.7* 7.7*  HCT 18.4* 21.2* 19.9* 24.1* 24.0*  MCV 87.9  --  83.7  --  84.2  PLT 414  --  368  --  297      Lab Results   Component Value Date   HEPBSAG Negative 08/09/2016   HEPBSAB Reactive 06/01/2016   HEPBIGM Negative 06/01/2016      Microbiology:  No results found for this or any previous visit (from the past 240 hour(s)).  Coagulation Studies: No results for input(s): LABPROT, INR in the last 72 hours.  Urinalysis: No results for input(s): COLORURINE, LABSPEC, PHURINE, GLUCOSEU, HGBUR, BILIRUBINUR, KETONESUR, PROTEINUR, UROBILINOGEN, NITRITE, LEUKOCYTESUR in the last 72 hours.  Invalid input(s): APPERANCEUR    Imaging: US Venous Img Lower Unilateral Left  Result Date: 08/11/2016 CLINICAL DATA:  Left lower extremity edema for 2 months. EXAM: Left LOWER EXTREMITY VENOUS DOPPLER ULTRASOUND TECHNIQUE: Gray-scale sonography with graded compression, as well as color Doppler and duplex ultrasound were performed to evaluate the lower extremity deep venous systems from the level of the common femoral vein and including the common femoral, femoral, profunda femoral, popliteal and calf veins including the posterior tibial, peroneal and gastrocnemius veins when visible. The superficial great saphenous vein was also interrogated. Spectral Doppler was  utilized to evaluate flow at rest and with distal augmentation maneuvers in the common femoral, femoral and popliteal veins. COMPARISON:  Ultrasound of April 16, 2016. FINDINGS: Contralateral Common Femoral Vein: Respiratory phasicity is normal and symmetric with the symptomatic side. No evidence of thrombus. Normal compressibility. Common Femoral Vein: No evidence of thrombus. Normal compressibility, respiratory phasicity and response to augmentation. Saphenofemoral Junction: No evidence of thrombus. Normal compressibility and flow on color Doppler imaging. Profunda Femoral Vein: No evidence of thrombus. Normal compressibility and flow on color Doppler imaging. Femoral Vein: No evidence of thrombus. Normal compressibility, respiratory phasicity and response to  augmentation. Popliteal Vein: No evidence of thrombus. Normal compressibility, respiratory phasicity and response to augmentation. Calf Veins: No evidence of thrombus. Normal compressibility and flow on color Doppler imaging. Superficial Great Saphenous Vein: No evidence of thrombus. Normal compressibility and flow on color Doppler imaging. Venous Reflux:  None. Other Findings: Edema is noted in the soft tissues of the left calf. IMPRESSION: No evidence of DVT within the left lower extremity. Electronically Signed   By: Marijo Conception, M.D.   On: 08/11/2016 11:38   Dg Chest Portable 1 View  Result Date: 08/09/2016 CLINICAL DATA:  Leukocytosis. EXAM: PORTABLE CHEST 1 VIEW COMPARISON:  PA and lateral chest 07/11/2016, 07/06/2016 and 02/01/2016. CT chest 03/20/2016. FINDINGS: The patient is status post CABG. There is marked cardiomegaly. No consolidative process, pneumothorax or effusion. Lung volumes are somewhat lower than on the comparison examinations with chronic bronchovascular structures. The patient is slightly rotated on the exam. IMPRESSION: Cardiomegaly without acute disease. Electronically Signed   By: Inge Rise M.D.   On: 08/09/2016 14:24     Medications:   . sodium chloride    . albumin human    . albumin human    . cefTRIAXone (ROCEPHIN)  IV Stopped (08/11/16 1210)  . iron sucrose     . atorvastatin  40 mg Oral Daily  . docusate sodium  100 mg Oral BID  . epoetin (EPOGEN/PROCRIT) injection  10,000 Units Intravenous Q T,Th,Sa-HD  . furosemide  80 mg Oral Daily  . gabapentin  300 mg Oral QHS  . insulin aspart  0-5 Units Subcutaneous QHS  . insulin aspart  0-9 Units Subcutaneous TID WC  . ipratropium-albuterol  3 mL Nebulization Q6H  . labetalol  100 mg Oral Daily  . methylPREDNISolone (SOLU-MEDROL) injection  40 mg Intravenous Q breakfast  . mometasone-formoterol  2 puff Inhalation BID  . multivitamin  1 tablet Oral Daily  . pantoprazole  40 mg Oral Daily  . sevelamer  carbonate  2,400 mg Oral TID WC     Assessment/ Plan:  56 y.o. male with ESRD on hemodialysis, hypertension, peripheral vascular disease, coronary artery disease, cirrhosis, substance abuse, recurrent GI bleed, admission for SOB 07/03/16.  TTS [facility] [place] Right arm AVF   1. End stage renal disease: TTS. Patient due for hemodialysis today. Orders have been prepared. Continue dialysis on a TTS schedule. will dialyze him today for volume removal.   - he is undergoing prep for endoscopy  2. Hypertension: Blood pressure low normal - will monitor  3. Anemia of chronic kidney disease with recent GI blood loss:  - hemoglobin low at 7.7.   - Procrit with dialysis - endoscopy planned tomorrow  4. Secondary Hyperparathyroidism:  - monitor phos  continue current dosage of Renvela once able to take PO  5. Severe hyperkalemia Urgent hemodialysis 5/29 improved  6.  Lower extremity edema - Ultrafiltration  with dialysis as tolerated 3000 cc removed on 5/29, 5/30 Volume removal with HD today as tolerated   LOS: 2 [facility] 5/31/20181:40 PM  J[place], [place]

## 2016-08-11 NOTE — Progress Notes (Signed)
Upon entering room pt was eating solid food which is typically contraindicated when patients are scheduled for a colonoscopy. Called GI Md who gave verbal order for clear liquids and will give other half of go lytely prep tonight.

## 2016-08-12 ENCOUNTER — Other Ambulatory Visit: Payer: Self-pay | Admitting: Gastroenterology

## 2016-08-12 ENCOUNTER — Encounter: Payer: Self-pay | Admitting: Anesthesiology

## 2016-08-12 ENCOUNTER — Inpatient Hospital Stay: Payer: Medicare Other | Admitting: Anesthesiology

## 2016-08-12 ENCOUNTER — Encounter
Admission: EM | Discharge status: Against Medical Advice | Payer: Self-pay | Source: Home / Self Care | Attending: Internal Medicine

## 2016-08-12 DIAGNOSIS — K633 Ulcer of intestine: Secondary | ICD-10-CM

## 2016-08-12 DIAGNOSIS — D123 Benign neoplasm of transverse colon: Secondary | ICD-10-CM

## 2016-08-12 DIAGNOSIS — D649 Anemia, unspecified: Secondary | ICD-10-CM

## 2016-08-12 DIAGNOSIS — K5520 Angiodysplasia of colon without hemorrhage: Secondary | ICD-10-CM

## 2016-08-12 DIAGNOSIS — B3781 Candidal esophagitis: Secondary | ICD-10-CM

## 2016-08-12 DIAGNOSIS — K922 Gastrointestinal hemorrhage, unspecified: Secondary | ICD-10-CM

## 2016-08-12 DIAGNOSIS — R195 Other fecal abnormalities: Secondary | ICD-10-CM

## 2016-08-12 HISTORY — PX: COLONOSCOPY WITH PROPOFOL: SHX5780

## 2016-08-12 HISTORY — PX: ENTEROSCOPY: SHX5533

## 2016-08-12 LAB — CBC
HEMATOCRIT: 24.7 % — AB (ref 40.0–52.0)
Hemoglobin: 7.9 g/dL — ABNORMAL LOW (ref 13.0–18.0)
MCH: 27.4 pg (ref 26.0–34.0)
MCHC: 32 g/dL (ref 32.0–36.0)
MCV: 85.6 fL (ref 80.0–100.0)
Platelets: 287 10*3/uL (ref 150–440)
RBC: 2.88 MIL/uL — ABNORMAL LOW (ref 4.40–5.90)
RDW: 16.5 % — AB (ref 11.5–14.5)
WBC: 9 10*3/uL (ref 3.8–10.6)

## 2016-08-12 LAB — GLUCOSE, CAPILLARY
Glucose-Capillary: 102 mg/dL — ABNORMAL HIGH (ref 65–99)
Glucose-Capillary: 113 mg/dL — ABNORMAL HIGH (ref 65–99)

## 2016-08-12 LAB — PROCALCITONIN: Procalcitonin: 2.35 ng/mL

## 2016-08-12 SURGERY — COLONOSCOPY WITH PROPOFOL
Anesthesia: General

## 2016-08-12 MED ORDER — SODIUM CHLORIDE 0.9 % IV SOLN
INTRAVENOUS | Status: DC | PRN
  Administered 2016-08-12 (×2): via INTRAVENOUS

## 2016-08-12 MED ORDER — PREDNISONE 10 MG PO TABS: ORAL_TABLET | ORAL | 0 refills | Status: DC

## 2016-08-12 MED ORDER — LIDOCAINE HCL (CARDIAC) 20 MG/ML IV SOLN
INTRAVENOUS | Status: DC | PRN
  Administered 2016-08-12: 60 mg via INTRAVENOUS

## 2016-08-12 MED ORDER — LEVOFLOXACIN 500 MG PO TABS: ORAL_TABLET | ORAL | 0 refills | Status: DC

## 2016-08-12 MED ORDER — PROPOFOL 500 MG/50ML IV EMUL
INTRAVENOUS | Status: AC
  Filled 2016-08-12: qty 50

## 2016-08-12 MED ORDER — LIDOCAINE HCL (PF) 2 % IJ SOLN
INTRAMUSCULAR | Status: AC
  Filled 2016-08-12: qty 2

## 2016-08-12 MED ORDER — PROPOFOL 500 MG/50ML IV EMUL
INTRAVENOUS | Status: AC
Start: 2016-08-12 — End: 2016-08-12
  Filled 2016-08-12: qty 50

## 2016-08-12 MED ORDER — MIDAZOLAM HCL 2 MG/2ML IJ SOLN
INTRAMUSCULAR | Status: AC
  Filled 2016-08-12: qty 2

## 2016-08-12 MED ORDER — FLUCONAZOLE 50 MG PO TABS
50.0000 mg | ORAL_TABLET | Freq: Every day | ORAL | Status: DC
  Filled 2016-08-12: qty 1

## 2016-08-12 MED ORDER — PROPOFOL 500 MG/50ML IV EMUL
INTRAVENOUS | Status: DC | PRN
  Administered 2016-08-12: 140 ug/kg/min via INTRAVENOUS

## 2016-08-12 MED ORDER — SODIUM CHLORIDE 0.9 % IV SOLN
400.0000 mg | Freq: Once | INTRAVENOUS | Status: DC
  Filled 2016-08-12: qty 20

## 2016-08-12 MED ORDER — PROPOFOL 10 MG/ML IV BOLUS
INTRAVENOUS | Status: DC | PRN
  Administered 2016-08-12: 50 mg via INTRAVENOUS

## 2016-08-12 NOTE — Anesthesia Postprocedure Evaluation (Signed)
Anesthesia Post Note  Patient: Stanley [name]  Procedure(s) Performed: Procedure(s) (LRB): COLONOSCOPY WITH PROPOFOL (N/A) ENTEROSCOPY (N/A)  Patient location during evaluation: PACU Anesthesia Type: General Level of consciousness: awake and alert and oriented Pain management: pain level controlled Vital Signs Assessment: post-procedure vital signs reviewed and stable Respiratory status: spontaneous breathing Cardiovascular status: blood pressure returned to baseline Anesthetic complications: no     Last Vitals:  Vitals:   08/12/16 1456 08/12/16 1506  BP: 101/61 (!) 86/56  Pulse: 78 74  Resp: (!) 9 10  Temp: 36.2 C     Last Pain:  Vitals:   08/12/16 1456  TempSrc: Tympanic  PainSc:                  Keosha Rossa

## 2016-08-12 NOTE — Anesthesia Post-op Follow-up Note (Cosign Needed)
Anesthesia QCDR form completed.        

## 2016-08-12 NOTE — Anesthesia Preprocedure Evaluation (Addendum)
Anesthesia Evaluation  Patient identified by MRN, date of birth, ID band Patient awake    Reviewed: Allergy & Precautions, NPO status , Patient's Chart, lab work & pertinent test results, reviewed documented beta blocker date and time   Airway Mallampati: II  TM Distance: >3 FB     Dental  (+) Chipped   Pulmonary shortness of breath and with exertion, Current Smoker,           Cardiovascular hypertension, Pt. on medications + CAD, + CABG and + Peripheral Vascular Disease       Neuro/Psych  Neuromuscular disease    GI/Hepatic GERD  ,(+) Cirrhosis   ascites    ,   Endo/Other  diabetes, Type 2  Renal/GU ESRFRenal disease     Musculoskeletal   Abdominal   Peds  Hematology  (+) anemia ,   Anesthesia Other Findings ascitis. ETOH abuse.  Reproductive/Obstetrics                             Anesthesia Physical  Anesthesia Plan  ASA: IV  Anesthesia Plan: General   Post-op Pain Management:    Induction: Intravenous  Airway Management Planned: Nasal Cannula  Additional Equipment:   Intra-op Plan:   Post-operative Plan:   Informed Consent: I have reviewed the patients History and Physical, chart, labs and discussed the procedure including the risks, benefits and alternatives for the proposed anesthesia with the patient or authorized representative who has indicated his/her understanding and acceptance.     Plan Discussed with: CRNA  Anesthesia Plan Comments:         Anesthesia Quick Evaluation

## 2016-08-12 NOTE — Progress Notes (Signed)
08/12/2016  4:54 PM  Pt returned from endoscopy very eager to leave, became agitated and insisting he will leave no matter what.  Explained the risks of leaving against medical advice and pt remained determined.  Notified attending physician [place].  Gave AMA papers and pt signed.  Dola Argyle, RN

## 2016-08-12 NOTE — Care Management Important Message (Signed)
Important Message  Patient Details  Name: Stanley [name] MRN: [id number] Date of Birth: 06-17-1960   Medicare Important Message Given:  Yes    Beverly Sessions, RN 08/12/2016, 4:35 PM

## 2016-08-12 NOTE — Transfer of Care (Signed)
Immediate Anesthesia Transfer of Care Note  Patient: Stanley [name]  Procedure(s) Performed: Procedure(s): COLONOSCOPY WITH PROPOFOL (N/A) ENTEROSCOPY (N/A)  Patient Location: PACU  Anesthesia Type:General  Level of Consciousness: sedated  Airway & Oxygen Therapy: Patient Spontanous Breathing and Patient connected to nasal cannula oxygen  Post-op Assessment: Report given to RN and Post -op Vital signs reviewed and stable  Post vital signs: Reviewed and stable  Last Vitals:  Vitals:   08/12/16 1256 08/12/16 1456  BP: 106/64 101/61  Pulse: 75 78  Resp: 20 (!) 9  Temp: 37.2 C 36.2 C    Last Pain:  Vitals:   08/12/16 1456  TempSrc: Tympanic  PainSc:          Complications: No apparent anesthesia complications

## 2016-08-12 NOTE — Progress Notes (Signed)
Pharmacy Antibiotic Note  Stanley [name] is a 56 y.o. male admitted on 08/09/2016 with  cellulitis and COPD exacerbation with elevated procalcitonin.  Pharmacy has been consulted for ceftriaxone dosing.  Plan: C ceftriaxone 2g IV every 24 hours.   Height: 6\' 3"  (190.5 cm) Weight: 173 lb 8 oz (78.7 kg) IBW/kg (Calculated) : 84.5  Temp (24hrs), Avg:98.2 F (36.8 C), Min:97.8 F (36.6 C), Max:98.9 F (37.2 C)   Recent Labs Lab 08/09/16 1110 08/10/16 0358 08/11/16 0432 08/12/16 0513  WBC 19.0* 9.6 9.0 9.0  CREATININE 11.49* 7.07* 4.93*  --     Estimated Creatinine Clearance: 18.4 mL/min (A) (by C-G formula based on SCr of 4.93 mg/dL (H)).    No Known Allergies  Antimicrobials this admission: 5/31 Ceftriaxone>>  Dose adjustments this admission:  Microbiology results:  Thank you for allowing pharmacy to be a part of this patient's care.  Ramond Dial, PharmD, BCPS Clinical Pharmacist 08/12/2016 1:33 PM

## 2016-08-12 NOTE — Op Note (Signed)
[facility] Gastroenterology Patient Name: Stanley [name] Procedure Date: 08/12/2016 2:03 PM MRN: [id number] Account #: [id number] Date of Birth: Mar 10, 1961 Admit Type: Inpatient Age: 56 Room: [facility] ENDO ROOM 1 Gender: Male Note Status: Finalized Procedure:            Small bowel enteroscopy Indications:          Obscure gastrointestinal bleeding Providers:            [name] MD, MD Referring MD:         No Local Md, MD (Referring MD) Medicines:            Propofol per Anesthesia Complications:        No immediate complications. Procedure:            Pre-Anesthesia Assessment:                       - Prior to the procedure, a History and Physical was                        performed, and patient medications and allergies were                        reviewed. The patient's tolerance of previous                        anesthesia was also reviewed. The risks and benefits of                        the procedure and the sedation options and risks were                        discussed with the patient. All questions were                        answered, and informed consent was obtained. Prior                        Anticoagulants: The patient has taken no previous                        anticoagulant or antiplatelet agents. ASA Grade                        Assessment: II - A patient with mild systemic disease.                        After reviewing the risks and benefits, the patient was                        deemed in satisfactory condition to undergo the                        procedure.                       After obtaining informed consent, the endoscope was                        passed under direct vision. Throughout the procedure,  the patient's blood pressure, pulse, and oxygen                        saturations were monitored continuously. The                        Colonoscope was introduced through the mouth and     advanced to the small bowel distal to the Ligament of                        [place]itz. The small bowel enteroscopy was accomplished                        without difficulty. The patient tolerated the procedure                        well. Findings:      The stomach was normal.      A few angioectasias with bleeding were found in the second portion of       the duodenum. Coagulation for hemostasis using argon beam at 2       liters/minute and 30 watts was successful.      Patchy candidiasis was found in the entire esophagus. Impression:           - Normal stomach.                       - A few bleeding angioectasias in the duodenum. Treated                        with argon beam coagulation.                       - Monilial esophagitis.                       - No specimens collected. Recommendation:       - Perform a colonoscopy today. Procedure Code(s):    --- Professional ---                       [phone], Small intestinal endoscopy, enteroscopy beyond                        second portion of duodenum, not including ileum; with                        control of bleeding (eg, injection, bipolar cautery,                        unipolar cautery, laser, heater probe, stapler, plasma                        coagulator) Diagnosis Code(s):    --- Professional ---                       K92.2, Gastrointestinal hemorrhage, unspecified                       K31.811, Angiodysplasia of stomach and duodenum with  bleeding                       B37.81, Candidal esophagitis CPT copyright 2016 American Medical Association. All rights reserved. The codes documented in this report are preliminary and upon coder review may  be revised to meet current compliance requirements. [name] MD, MD 08/12/2016 2:29:49 PM This report has been signed electronically. Number of Addenda: 0 Note Initiated On: 08/12/2016 2:03 PM      [facility]

## 2016-08-12 NOTE — Progress Notes (Signed)
Patient ID: Stanley [name], male   DOB: 1961/02/26, 56 y.o.   MRN: [id number]   [facility] [employer] PROGRESS NOTE  Stanley [name] GLO:[id number] DOB: [birth year] DOA: 08/09/2016 PCP: [name], MD  HPI/Subjective: Patient feeling better. Wanting to eat. Seen prior to her procedure. He states that he believes has pain in his left leg and that it never goes away. Still having some cough. But feeling better  Objective: Vitals:   08/12/16 0500 08/12/16 1215  BP: 121/65 129/67  Pulse: 87 86  Resp: 16 16  Temp: 98.2 F (36.8 C) 98.2 F (36.8 C)    Filed Weights   08/09/16 1835 08/11/16 1310 08/11/16 1731  Weight: 81.7 kg (180 lb 1.9 oz) 81.7 kg (180 lb 1.9 oz) 78.7 kg (173 lb 8 oz)    ROS: Review of Systems  Constitutional: Negative for chills and fever.  Eyes: Negative for blurred vision.  Respiratory: Positive for cough. Negative for shortness of breath.   Cardiovascular: Negative for chest pain.  Gastrointestinal: Negative for abdominal pain, constipation, diarrhea, nausea and vomiting.  Genitourinary: Negative for dysuria.  Musculoskeletal: Positive for joint pain.  Neurological: Negative for dizziness and headaches.   Exam: Physical Exam  Constitutional: He is oriented to person, place, and time.  HENT:  Nose: No mucosal edema.  Mouth/Throat: No oropharyngeal exudate or posterior oropharyngeal edema.  Eyes: EOM and lids are normal. Pupils are equal, round, and reactive to light.  Conjunctiva pale  Neck: No JVD present. Carotid bruit is not present. No edema present. No thyroid mass and no thyromegaly present.  Cardiovascular: S1 normal and S2 normal.  Exam reveals no gallop.   No murmur heard. Pulses:      Dorsalis pedis pulses are 2+ on the right side, and 2+ on the left side.  Respiratory: No respiratory distress. He has decreased breath sounds in the right lower field, the left middle field and the left lower field. He has wheezes in the right lower  field and the left middle field. He has no rhonchi. He has no rales.  GI: Soft. Bowel sounds are normal. There is no tenderness.  Musculoskeletal:       Right ankle: He exhibits swelling.       Left ankle: He exhibits swelling.  Lymphadenopathy:    He has no cervical adenopathy.  Neurological: He is alert and oriented to person, place, and time. No cranial nerve deficit.  Skin: Skin is warm. No rash noted. Nails show no clubbing.  Psychiatric: He has a normal mood and affect.      Data Reviewed: Basic Metabolic Panel:  Recent Labs Lab 08/09/16 1110 08/09/16 1959 08/10/16 0358 08/11/16 0432  NA 139  --  141 137  K 7.4* 4.4 4.6 3.6  CL 100*  --  100* 95*  CO2 21*  --  28 31  GLUCOSE 76  --  173* 100*  BUN 105*  --  56* 30*  CREATININE 11.49*  --  7.07* 4.93*  CALCIUM 9.2  --  8.2* 7.9*   Liver Function Tests:  Recent Labs Lab 08/09/16 1110  AST 33  ALT 21  ALKPHOS 164*  BILITOT 0.9  PROT 6.9  ALBUMIN 3.1*   CBC:  Recent Labs Lab 08/09/16 1110 08/09/16 1959 08/10/16 0358 08/10/16 1638 08/11/16 0432 08/12/16 0513  WBC 19.0*  --  9.6  --  9.0 9.0  HGB 5.3* 6.6* 6.3* 7.7* 7.7* 7.9*  HCT 18.4* 21.2* 19.9* 24.1* 24.0* 24.7*  MCV  87.9  --  83.7  --  84.2 85.6  PLT 414  --  368  --  297 287   BNP (last 3 results)  Recent Labs  05/30/16 2328 07/03/16 1735  BNP 4,018.0* 3,107.0*     CBG:  Recent Labs Lab 08/11/16 1214 08/11/16 1759 08/11/16 2150 08/12/16 0753 08/12/16 1141  GLUCAP 217* 177* 114* 102* 113*      Studies: US Venous Img Lower Unilateral Left  Result Date: 08/11/2016 CLINICAL DATA:  Left lower extremity edema for 2 months. EXAM: Left LOWER EXTREMITY VENOUS DOPPLER ULTRASOUND TECHNIQUE: Gray-scale sonography with graded compression, as well as color Doppler and duplex ultrasound were performed to evaluate the lower extremity deep venous systems from the level of the common femoral vein and including the common femoral, femoral,  profunda femoral, popliteal and calf veins including the posterior tibial, peroneal and gastrocnemius veins when visible. The superficial great saphenous vein was also interrogated. Spectral Doppler was utilized to evaluate flow at rest and with distal augmentation maneuvers in the common femoral, femoral and popliteal veins. COMPARISON:  Ultrasound of April 16, 2016. FINDINGS: Contralateral Common Femoral Vein: Respiratory phasicity is normal and symmetric with the symptomatic side. No evidence of thrombus. Normal compressibility. Common Femoral Vein: No evidence of thrombus. Normal compressibility, respiratory phasicity and response to augmentation. Saphenofemoral Junction: No evidence of thrombus. Normal compressibility and flow on color Doppler imaging. Profunda Femoral Vein: No evidence of thrombus. Normal compressibility and flow on color Doppler imaging. Femoral Vein: No evidence of thrombus. Normal compressibility, respiratory phasicity and response to augmentation. Popliteal Vein: No evidence of thrombus. Normal compressibility, respiratory phasicity and response to augmentation. Calf Veins: No evidence of thrombus. Normal compressibility and flow on color Doppler imaging. Superficial Great Saphenous Vein: No evidence of thrombus. Normal compressibility and flow on color Doppler imaging. Venous Reflux:  None. Other Findings: Edema is noted in the soft tissues of the left calf. IMPRESSION: No evidence of DVT within the left lower extremity. Electronically Signed   By: Marijo Conception, M.D.   On: 08/11/2016 11:38    Scheduled Meds: . atorvastatin  40 mg Oral Daily  . docusate sodium  100 mg Oral BID  . epoetin (EPOGEN/PROCRIT) injection  10,000 Units Intravenous Q T,Th,Sa-HD  . furosemide  80 mg Oral Daily  . gabapentin  300 mg Oral QHS  . insulin aspart  0-5 Units Subcutaneous QHS  . insulin aspart  0-9 Units Subcutaneous TID WC  . ipratropium-albuterol  3 mL Nebulization Q6H  . labetalol  100  mg Oral Daily  . methylPREDNISolone (SOLU-MEDROL) injection  40 mg Intravenous Q breakfast  . mometasone-formoterol  2 puff Inhalation BID  . multivitamin  1 tablet Oral Daily  . pantoprazole  40 mg Oral Daily  . sevelamer carbonate  2,400 mg Oral TID WC   Continuous Infusions: . sodium chloride    . albumin human    . albumin human    . cefTRIAXone (ROCEPHIN)  IV Stopped (08/12/16 1133)  . [START ON 08/13/2016] iron sucrose      Assessment/Plan:  1. Acute on chronic blood loss anemia. Hemoglobin on admission 5.3. Patient received 4 units of blood and last hemoglobin is 7.9.  Gastroenterology to do a colonoscopy And a push enteroscopy today. Give another dose of IV iron tomorrow and check hemoglobin tomorrow. Patient must stop taking aspirin or any other blood thinners. 2. Acute hyperkalemia secondary to missing dialysis. Received dialysis here in hyperkalemia has improved. 3. Cellulitis left  lower extremity and COPD exacerbation. Started empiric Rocephin and Solu-Medrol. Patient can likely be switched over to oral antibiotic and a prednisone taper and the next day or so. 4. Essential hypertension on Norvasc and labetalol 5. Hyperlipidemia unspecified on atorvastatin 6. History of CAD and PAD 7. GERD on Protonix 8. End-stage renal disease on dialysis 9. History of cirrhosis with ascites  Code Status:     Code Status Orders        Start     Ordered   08/09/16 1700  Full code  Continuous     08/09/16 1659    Code Status History    Date Active Date Inactive Code Status Order ID Comments User Context   07/03/2016  9:54 PM 07/05/2016  9:22 PM Full Code [id number]  Demetrios Loll, MD Inpatient   06/24/2016  4:23 AM 06/26/2016  3:46 PM Full Code [id number]  Saundra Shelling, MD Inpatient   06/14/2016  2:53 PM 06/19/2016  9:04 PM Full Code [id number]  Hillary Bow, MD ED   05/31/2016  6:42 AM 06/01/2016  9:30 PM Full Code [id number]  Saundra Shelling, MD Inpatient   05/05/2016 11:09 PM 05/10/2016  9:57  PM Full Code [id number]  Vianne Bulls, MD Inpatient   04/16/2016  3:56 AM 04/17/2016  6:27 PM Full Code [id number]  Saundra Shelling, MD Inpatient   01/19/2016  3:13 PM 01/22/2016  2:14 PM Full Code [id number]  Loletha Grayer, MD ED   09/24/2015 10:33 AM 09/25/2015  1:31 PM Full Code [id number]  Loletha Grayer, MD ED   05/16/2015  2:17 PM 05/17/2015  8:57 PM Full Code [id number]  Idelle Crouch, MD Inpatient   04/30/2015  8:08 PM 05/08/2015  7:40 PM Full Code [id number]  Vaughan Basta, MD Inpatient   04/06/2015  5:07 AM 04/07/2015  5:36 PM Full Code [id number]  Lance Coon, MD Inpatient   03/23/2015 11:07 AM 03/27/2015 12:46 PM Full Code [id number]  Bettey Costa, MD Inpatient   06/19/2013 11:32 AM 06/21/2013  5:49 PM Full Code [id number]  Cristal Ford, DO Inpatient   06/16/2013  5:26 PM 06/19/2013 11:32 AM Full Code [id number]  Margarita Mail, PA-C ED   06/05/2013  3:33 PM 06/06/2013  6:56 PM Full Code [id number]  Theodoro Kos, DO Inpatient   06/02/2013  9:01 AM 06/05/2013  3:33 PM Full Code [id number]  Velvet Bathe, MD Inpatient   06/02/2013  8:40 AM 06/02/2013  9:01 AM Full Code [id number]  Velvet Bathe, MD Inpatient     Disposition Plan: Likely home tomorrow after dialysis  Consultants:  Critical care specialist  Gastroenterology  Time spent: 24 minutes  [place]

## 2016-08-12 NOTE — Op Note (Addendum)
[facility] Gastroenterology Patient Name: Stanley [name] Procedure Date: 08/12/2016 2:01 PM MRN: [id number] Account #: [id number] Date of Birth: [birth year] Admit Type: Inpatient Age: 56 Room: [facility] ENDO ROOM 1 Gender: Male Note Status: Finalized Procedure:            Colonoscopy Indications:          Heme positive stool Providers:            [name] MD, MD Referring MD:         No Local Md, MD (Referring MD) Medicines:            Propofol per Anesthesia Complications:        No immediate complications. Procedure:            Pre-Anesthesia Assessment:                       - Prior to the procedure, a History and Physical was                        performed, and patient medications and allergies were                        reviewed. The patient's tolerance of previous                        anesthesia was also reviewed. The risks and benefits of                        the procedure and the sedation options and risks were                        discussed with the patient. All questions were                        answered, and informed consent was obtained. Prior                        Anticoagulants: The patient has taken no previous                        anticoagulant or antiplatelet agents. ASA Grade                        Assessment: II - A patient with mild systemic disease.                        After reviewing the risks and benefits, the patient was                        deemed in satisfactory condition to undergo the                        procedure.                       After obtaining informed consent, the colonoscope was                        passed under direct vision. Throughout the procedure,  the patient's blood pressure, pulse, and oxygen                        saturations were monitored continuously. The                        Colonoscope was introduced through the anus and                        advanced to the the  cecum, identified by appendiceal                        orifice and ileocecal valve. The colonoscopy was                        performed without difficulty. The patient tolerated the                        procedure well. The quality of the bowel preparation                        was good. Findings:      The perianal and digital rectal examinations were normal.      A few localized non-bleeding erosions were found in the descending       colon, in the ascending colon and in the cecum.      A few medium-sized patchy angioectasias without bleeding were found in       the ascending colon. Coagulation for hemostasis using argon plasma at 2       liters/minute and 30 watts was successful.      A 7 mm polyp was found in the transverse colon. The polyp was sessile.       The polyp was removed with a hot snare. Resection and retrieval were       complete.      Internal hemorrhoids were found during retroflexion. The hemorrhoids       were Grade I (internal hemorrhoids that do not prolapse). Impression:           - A few erosions in the descending colon, in the                        ascending colon and in the cecum.                       - A few non-bleeding colonic angioectasias. Treated                        with argon plasma coagulation (APC).                       - One 7 mm polyp in the transverse colon, removed with                        a hot snare. Resected and retrieved.                       - Internal hemorrhoids. Recommendation:       - Return patient to hospital ward for ongoing care.                       -  Await pathology results. Procedure Code(s):    --- Professional ---                       [phone], 59, Colonoscopy, flexible; with control of                        bleeding, any method                       [id number], Colonoscopy, flexible; with removal of tumor(s),                        polyp(s), or other lesion(s) by snare technique Diagnosis Code(s):    --- Professional  ---                       R19.5, Other fecal abnormalities                       D12.3, Benign neoplasm of transverse colon (hepatic                        flexure or splenic flexure)                       K63.3, Ulcer of intestine                       K55.20, Angiodysplasia of colon without hemorrhage CPT copyright 2016 American Medical Association. All rights reserved. The codes documented in this report are preliminary and upon coder review may  be revised to meet current compliance requirements. [name] MD, MD 08/12/2016 2:56:37 PM This report has been signed electronically. Number of Addenda: 0 Note Initiated On: 08/12/2016 2:01 PM Scope Withdrawal Time: 0 hours 10 minutes 41 seconds  Total Procedure Duration: 0 hours 17 minutes 42 seconds       [facility]

## 2016-08-12 NOTE — Progress Notes (Signed)
Patient ID: Stanley [name], male   DOB: [birth year], 56 y.o.   MRN: [id number]  I was notified by the nurse that the patient may sign out [name]. I feel uncomfortable discharging him home this evening after he had angiectasia is cauterized in his colon and with small bowel enteroscopy. I would recommend checking a hemoglobin tomorrow and iron infusion tomorrow. The patient was admitted with severe anemia.   I did prescribe Levaquin after dialysis for COPD exacerbation and cellulitis of left lower extremity and 3 days' worth of prednisone. If he does sign out [name].  Dr. Loletha Grayer

## 2016-08-12 NOTE — Progress Notes (Signed)
[facility], [place] 08/12/16  Subjective:   Patient presented to the emergency room for abdominal distention He was dialyzed emergently for high potassium.  Potassium level is now within normal range Patient continues to have abdominal distention and leg edema So far about 9000 cc of fluid has been removed with HD so far Scheduled for endoscopy today  Objective:  Vital signs in last 24 hours:  Temp:  [97.8 F (36.6 C)-98.9 F (37.2 C)] 98.9 F (37.2 C) (06/01 1256) Pulse Rate:  [75-93] 75 (06/01 1256) Resp:  [10-20] 20 (06/01 1256) BP: (98-131)/(55-76) 106/64 (06/01 1256) SpO2:  [90 %-100 %] 98 % (06/01 1256) Weight:  [78.7 kg (173 lb 8 oz)] 78.7 kg (173 lb 8 oz) (05/31 1731)  Weight change:  Filed Weights   08/09/16 1835 08/11/16 1310 08/11/16 1731  Weight: 81.7 kg (180 lb 1.9 oz) 81.7 kg (180 lb 1.9 oz) 78.7 kg (173 lb 8 oz)    Intake/Output:    Intake/Output Summary (Last 24 hours) at 08/12/16 1319 Last data filed at 08/11/16 2150  Gross per 24 hour  Intake              800 ml  Output             3000 ml  Net            -2200 ml     Physical Exam: General: No acute distress, laying in the bed  HEENT Anicteric, dry oral mucous membranes  Neck supple  Pulm/lungs Normal breathing, no crackles  CVS/Heart Regular, no rub  Abdomen:  Distended, ascites  Extremities: + pitting edema bilaterally; Left > right  Neurologic: Alert, oriented. able to answer questions  Skin: No acute rashes  Access: AV fistula       Basic Metabolic Panel:   Recent Labs Lab 08/09/16 1110 08/09/16 1959 08/10/16 0358 08/11/16 0432  NA 139  --  141 137  K 7.4* 4.4 4.6 3.6  CL 100*  --  100* 95*  CO2 21*  --  28 31  GLUCOSE 76  --  173* 100*  BUN 105*  --  56* 30*  CREATININE 11.49*  --  7.07* 4.93*  CALCIUM 9.2  --  8.2* 7.9*     CBC:  Recent Labs Lab 08/09/16 1110 08/09/16 1959 08/10/16 0358 08/10/16 1638 08/11/16 0432 08/12/16 0513   WBC 19.0*  --  9.6  --  9.0 9.0  HGB 5.3* 6.6* 6.3* 7.7* 7.7* 7.9*  HCT 18.4* 21.2* 19.9* 24.1* 24.0* 24.7*  MCV 87.9  --  83.7  --  84.2 85.6  PLT 414  --  368  --  297 287      Lab Results  Component Value Date   HEPBSAG Negative 08/09/2016   HEPBSAB Reactive 06/01/2016   HEPBIGM Negative 06/01/2016      Microbiology:  Recent Results (from the past 240 hour(s))  MRSA PCR Screening     Status: None   Collection Time: 08/11/16  6:00 PM  Result Value Ref Range Status   MRSA by PCR NEGATIVE NEGATIVE Final    Comment:        The GeneXpert MRSA Assay (FDA approved for NASAL specimens only), is one component of a comprehensive MRSA colonization surveillance program. It is not intended to diagnose MRSA infection nor to guide or monitor treatment for MRSA infections.     Coagulation Studies: No results for input(s): LABPROT, INR in the last 72 hours.  Urinalysis: No results for input(s): COLORURINE, LABSPEC, PHURINE, GLUCOSEU, HGBUR, BILIRUBINUR, KETONESUR, PROTEINUR, UROBILINOGEN, NITRITE, LEUKOCYTESUR in the last 72 hours.  Invalid input(s): APPERANCEUR    Imaging: US Venous Img Lower Unilateral Left  Result Date: 08/11/2016 CLINICAL DATA:  Left lower extremity edema for 2 months. EXAM: Left LOWER EXTREMITY VENOUS DOPPLER ULTRASOUND TECHNIQUE: Gray-scale sonography with graded compression, as well as color Doppler and duplex ultrasound were performed to evaluate the lower extremity deep venous systems from the level of the common femoral vein and including the common femoral, femoral, profunda femoral, popliteal and calf veins including the posterior tibial, peroneal and gastrocnemius veins when visible. The superficial great saphenous vein was also interrogated. Spectral Doppler was utilized to evaluate flow at rest and with distal augmentation maneuvers in the common femoral, femoral and popliteal veins. COMPARISON:  Ultrasound of April 16, 2016. FINDINGS:  Contralateral Common Femoral Vein: Respiratory phasicity is normal and symmetric with the symptomatic side. No evidence of thrombus. Normal compressibility. Common Femoral Vein: No evidence of thrombus. Normal compressibility, respiratory phasicity and response to augmentation. Saphenofemoral Junction: No evidence of thrombus. Normal compressibility and flow on color Doppler imaging. Profunda Femoral Vein: No evidence of thrombus. Normal compressibility and flow on color Doppler imaging. Femoral Vein: No evidence of thrombus. Normal compressibility, respiratory phasicity and response to augmentation. Popliteal Vein: No evidence of thrombus. Normal compressibility, respiratory phasicity and response to augmentation. Calf Veins: No evidence of thrombus. Normal compressibility and flow on color Doppler imaging. Superficial Great Saphenous Vein: No evidence of thrombus. Normal compressibility and flow on color Doppler imaging. Venous Reflux:  None. Other Findings: Edema is noted in the soft tissues of the left calf. IMPRESSION: No evidence of DVT within the left lower extremity. Electronically Signed   By: Marijo Conception, M.D.   On: 08/11/2016 11:38     Medications:   . sodium chloride    . [MAR Hold] albumin human    . [MAR Hold] albumin human    . [MAR Hold] cefTRIAXone (ROCEPHIN)  IV Stopped (08/12/16 1133)  . [MAR Hold] iron sucrose     . [MAR Hold] atorvastatin  40 mg Oral Daily  . [MAR Hold] docusate sodium  100 mg Oral BID  . [MAR Hold] epoetin (EPOGEN/PROCRIT) injection  10,000 Units Intravenous Q T,Th,Sa-HD  . [MAR Hold] furosemide  80 mg Oral Daily  . [MAR Hold] gabapentin  300 mg Oral QHS  . [MAR Hold] insulin aspart  0-5 Units Subcutaneous QHS  . [MAR Hold] insulin aspart  0-9 Units Subcutaneous TID WC  . [MAR Hold] ipratropium-albuterol  3 mL Nebulization Q6H  . [MAR Hold] labetalol  100 mg Oral Daily  . [MAR Hold] methylPREDNISolone (SOLU-MEDROL) injection  40 mg Intravenous Q  breakfast  . [MAR Hold] mometasone-formoterol  2 puff Inhalation BID  . [MAR Hold] multivitamin  1 tablet Oral Daily  . [MAR Hold] pantoprazole  40 mg Oral Daily  . [MAR Hold] sevelamer carbonate  2,400 mg Oral TID WC     Assessment/ Plan:  56 y.o. male with ESRD on hemodialysis, hypertension, peripheral vascular disease, coronary artery disease, cirrhosis, substance abuse, recurrent GI bleed, admission for SOB 07/03/16.  TTS [facility] Right arm AVF   1. End stage renal disease: TTS. Continue dialysis on a TTS schedule. - nxt HD tomorrow  2. Hypertension: Blood pressure low normal - will monitor  3. Anemia of chronic kidney disease with recent GI blood loss:  - hemoglobin low at 7.9.   -  Procrit with dialysis - endoscopy planned today  4. Secondary Hyperparathyroidism:  - monitor phos  continue current dosage of Renvela    5. Severe hyperkalemia Urgent hemodialysis 5/29 improved  6.  Lower extremity edema - Ultrafiltration with dialysis as tolerated fluid removed on 5/29, 5/30, 5/31. Total ~9000 cc so far Volume removal with HD as tolerated   LOS: 3 [facility] 6/1/20181:19 PM  [employer], [place]

## 2016-08-13 DIAGNOSIS — N186 End stage renal disease: Secondary | ICD-10-CM | POA: Diagnosis not present

## 2016-08-13 DIAGNOSIS — D649 Anemia, unspecified: Secondary | ICD-10-CM | POA: Diagnosis not present

## 2016-08-13 LAB — HAPTOGLOBIN: HAPTOGLOBIN: 198 mg/dL (ref 34–200)

## 2016-08-15 ENCOUNTER — Encounter: Payer: Self-pay | Admitting: Gastroenterology

## 2016-08-15 LAB — POCT I-STAT 4, (NA,K, GLUC, HGB,HCT)
GLUCOSE: 128 mg/dL — AB (ref 65–99)
HCT: 26 % — ABNORMAL LOW (ref 39.0–52.0)
Hemoglobin: 8.8 g/dL — ABNORMAL LOW (ref 13.0–17.0)
Potassium: 3.9 mmol/L (ref 3.5–5.1)
SODIUM: 136 mmol/L (ref 135–145)

## 2016-08-16 ENCOUNTER — Inpatient Hospital Stay
Admission: EM | Admit: 2016-08-16 | Discharge: 2016-08-18 | DRG: 377 | Discharge status: Against Medical Advice | Payer: Medicare Other | Attending: Internal Medicine | Admitting: Internal Medicine

## 2016-08-16 ENCOUNTER — Encounter: Payer: Self-pay | Admitting: Emergency Medicine

## 2016-08-16 ENCOUNTER — Emergency Department: Payer: Medicare Other

## 2016-08-16 ENCOUNTER — Encounter: Payer: Self-pay | Admitting: Gastroenterology

## 2016-08-16 DIAGNOSIS — Z992 Dependence on renal dialysis: Secondary | ICD-10-CM

## 2016-08-16 DIAGNOSIS — N2581 Secondary hyperparathyroidism of renal origin: Secondary | ICD-10-CM | POA: Diagnosis not present

## 2016-08-16 DIAGNOSIS — K648 Other hemorrhoids: Secondary | ICD-10-CM | POA: Diagnosis present

## 2016-08-16 DIAGNOSIS — E1142 Type 2 diabetes mellitus with diabetic polyneuropathy: Secondary | ICD-10-CM | POA: Diagnosis present

## 2016-08-16 DIAGNOSIS — K219 Gastro-esophageal reflux disease without esophagitis: Secondary | ICD-10-CM | POA: Diagnosis present

## 2016-08-16 DIAGNOSIS — R600 Localized edema: Secondary | ICD-10-CM | POA: Diagnosis not present

## 2016-08-16 DIAGNOSIS — D649 Anemia, unspecified: Secondary | ICD-10-CM | POA: Diagnosis present

## 2016-08-16 DIAGNOSIS — K922 Gastrointestinal hemorrhage, unspecified: Secondary | ICD-10-CM | POA: Diagnosis not present

## 2016-08-16 DIAGNOSIS — N186 End stage renal disease: Secondary | ICD-10-CM | POA: Diagnosis present

## 2016-08-16 DIAGNOSIS — K7031 Alcoholic cirrhosis of liver with ascites: Secondary | ICD-10-CM | POA: Diagnosis not present

## 2016-08-16 DIAGNOSIS — D631 Anemia in chronic kidney disease: Secondary | ICD-10-CM | POA: Diagnosis not present

## 2016-08-16 DIAGNOSIS — E1122 Type 2 diabetes mellitus with diabetic chronic kidney disease: Secondary | ICD-10-CM | POA: Diagnosis present

## 2016-08-16 DIAGNOSIS — R0602 Shortness of breath: Secondary | ICD-10-CM | POA: Diagnosis not present

## 2016-08-16 DIAGNOSIS — I2510 Atherosclerotic heart disease of native coronary artery without angina pectoris: Secondary | ICD-10-CM | POA: Diagnosis present

## 2016-08-16 DIAGNOSIS — I120 Hypertensive chronic kidney disease with stage 5 chronic kidney disease or end stage renal disease: Secondary | ICD-10-CM | POA: Diagnosis present

## 2016-08-16 DIAGNOSIS — K5521 Angiodysplasia of colon with hemorrhage: Secondary | ICD-10-CM | POA: Diagnosis not present

## 2016-08-16 DIAGNOSIS — R188 Other ascites: Secondary | ICD-10-CM

## 2016-08-16 DIAGNOSIS — K31811 Angiodysplasia of stomach and duodenum with bleeding: Secondary | ICD-10-CM | POA: Diagnosis present

## 2016-08-16 DIAGNOSIS — E1151 Type 2 diabetes mellitus with diabetic peripheral angiopathy without gangrene: Secondary | ICD-10-CM | POA: Diagnosis present

## 2016-08-16 DIAGNOSIS — E8770 Fluid overload, unspecified: Secondary | ICD-10-CM | POA: Diagnosis not present

## 2016-08-16 LAB — GLUCOSE, CAPILLARY: Glucose-Capillary: 102 mg/dL — ABNORMAL HIGH (ref 65–99)

## 2016-08-16 LAB — HEMOGLOBIN: HEMOGLOBIN: 7.6 g/dL — AB (ref 13.0–18.0)

## 2016-08-16 LAB — SURGICAL PATHOLOGY

## 2016-08-16 LAB — PROTIME-INR
INR: 1.17
Prothrombin Time: 15 seconds (ref 11.4–15.2)

## 2016-08-16 LAB — COMPREHENSIVE METABOLIC PANEL
ALT: 54 U/L (ref 17–63)
AST: 58 U/L — AB (ref 15–41)
Albumin: 2.9 g/dL — ABNORMAL LOW (ref 3.5–5.0)
Alkaline Phosphatase: 175 U/L — ABNORMAL HIGH (ref 38–126)
Anion gap: 13 (ref 5–15)
BILIRUBIN TOTAL: 0.9 mg/dL (ref 0.3–1.2)
BUN: 76 mg/dL — AB (ref 6–20)
CHLORIDE: 99 mmol/L — AB (ref 101–111)
CO2: 27 mmol/L (ref 22–32)
CREATININE: 6.95 mg/dL — AB (ref 0.61–1.24)
Calcium: 8.3 mg/dL — ABNORMAL LOW (ref 8.9–10.3)
GFR calc Af Amer: 9 mL/min — ABNORMAL LOW (ref >60)
GFR, EST NON AFRICAN AMERICAN: 8 mL/min — AB (ref >60)
Glucose, Bld: 88 mg/dL (ref 65–99)
Potassium: 4.4 mmol/L (ref 3.5–5.1)
Sodium: 139 mmol/L (ref 135–145)
TOTAL PROTEIN: 6.4 g/dL — AB (ref 6.5–8.1)

## 2016-08-16 LAB — PHOSPHORUS: Phosphorus: 3.7 mg/dL (ref 2.5–4.6)

## 2016-08-16 LAB — CBC
HEMATOCRIT: 22 % — AB (ref 40.0–52.0)
Hemoglobin: 6.8 g/dL — ABNORMAL LOW (ref 13.0–18.0)
MCH: 28.4 pg (ref 26.0–34.0)
MCHC: 31 g/dL — ABNORMAL LOW (ref 32.0–36.0)
MCV: 91.7 fL (ref 80.0–100.0)
Platelets: 238 10*3/uL (ref 150–440)
RBC: 2.39 MIL/uL — AB (ref 4.40–5.90)
RDW: 21.5 % — ABNORMAL HIGH (ref 11.5–14.5)
WBC: 11.6 10*3/uL — AB (ref 3.8–10.6)

## 2016-08-16 LAB — APTT: aPTT: 29 seconds (ref 24–36)

## 2016-08-16 LAB — PREPARE RBC (CROSSMATCH)

## 2016-08-16 LAB — TROPONIN I: Troponin I: 0.09 ng/mL (ref <0.03)

## 2016-08-16 MED ORDER — PREDNISONE 20 MG PO TABS
20.0000 mg | ORAL_TABLET | Freq: Every day | ORAL | Status: DC
  Administered 2016-08-17 – 2016-08-18 (×2): 20 mg via ORAL
  Filled 2016-08-16 (×2): qty 1

## 2016-08-16 MED ORDER — IPRATROPIUM-ALBUTEROL 0.5-2.5 (3) MG/3ML IN SOLN: 3.0000 mL | RESPIRATORY_TRACT | Status: AC

## 2016-08-16 MED ORDER — GABAPENTIN 300 MG PO CAPS
300.0000 mg | ORAL_CAPSULE | Freq: Every day | ORAL | Status: DC
  Administered 2016-08-16 – 2016-08-17 (×2): 300 mg via ORAL
  Filled 2016-08-16 (×4): qty 1

## 2016-08-16 MED ORDER — LIDOCAINE-PRILOCAINE 2.5-2.5 % EX CREA
1.0000 "application " | TOPICAL_CREAM | CUTANEOUS | Status: DC | PRN
  Filled 2016-08-16: qty 5

## 2016-08-16 MED ORDER — ALBUTEROL SULFATE (2.5 MG/3ML) 0.083% IN NEBU: 2.5000 mg | INHALATION_SOLUTION | RESPIRATORY_TRACT | Status: DC | PRN

## 2016-08-16 MED ORDER — LIDOCAINE HCL (PF) 1 % IJ SOLN
5.0000 mL | INTRAMUSCULAR | Status: DC | PRN
  Filled 2016-08-16: qty 5

## 2016-08-16 MED ORDER — ALTEPLASE 2 MG IJ SOLR: 2.0000 mg | Freq: Once | INTRAMUSCULAR | Status: DC | PRN

## 2016-08-16 MED ORDER — HEPARIN SODIUM (PORCINE) 1000 UNIT/ML DIALYSIS
1000.0000 [IU] | INTRAMUSCULAR | Status: DC | PRN
  Filled 2016-08-16: qty 1

## 2016-08-16 MED ORDER — MOMETASONE FURO-FORMOTEROL FUM 200-5 MCG/ACT IN AERO
2.0000 | INHALATION_SPRAY | Freq: Two times a day (BID) | RESPIRATORY_TRACT | Status: DC
  Administered 2016-08-16 – 2016-08-18 (×4): 2 via RESPIRATORY_TRACT
  Filled 2016-08-16: qty 8.8

## 2016-08-16 MED ORDER — HYDROCODONE-ACETAMINOPHEN 5-325 MG PO TABS
1.0000 | ORAL_TABLET | ORAL | Status: DC | PRN
  Administered 2016-08-16: 2 via ORAL
  Administered 2016-08-16: 1 via ORAL
  Administered 2016-08-17 (×2): 2 via ORAL
  Filled 2016-08-16 (×3): qty 2
  Filled 2016-08-16: qty 1
  Filled 2016-08-16: qty 2

## 2016-08-16 MED ORDER — SODIUM CHLORIDE 0.9 % IV SOLN: Freq: Once | INTRAVENOUS | Status: DC

## 2016-08-16 MED ORDER — NITROGLYCERIN 0.4 MG SL SUBL: 0.4000 mg | SUBLINGUAL_TABLET | SUBLINGUAL | Status: DC | PRN

## 2016-08-16 MED ORDER — DOCUSATE SODIUM 100 MG PO CAPS
100.0000 mg | ORAL_CAPSULE | Freq: Two times a day (BID) | ORAL | Status: DC
Start: 2016-08-16 — End: 2016-08-18
  Administered 2016-08-16 – 2016-08-18 (×5): 100 mg via ORAL
  Filled 2016-08-16 (×5): qty 1

## 2016-08-16 MED ORDER — SODIUM CHLORIDE 0.9 % IV SOLN: 100.0000 mL | INTRAVENOUS | Status: DC | PRN

## 2016-08-16 MED ORDER — POLYETHYLENE GLYCOL 3350 17 G PO PACK: 17.0000 g | PACK | Freq: Every day | ORAL | Status: DC | PRN

## 2016-08-16 MED ORDER — SODIUM CHLORIDE 0.9% FLUSH
3.0000 mL | Freq: Two times a day (BID) | INTRAVENOUS | Status: DC
  Administered 2016-08-16 – 2016-08-18 (×5): 3 mL via INTRAVENOUS

## 2016-08-16 MED ORDER — ACETAMINOPHEN 650 MG RE SUPP: 650.0000 mg | Freq: Four times a day (QID) | RECTAL | Status: DC | PRN

## 2016-08-16 MED ORDER — ONDANSETRON HCL 4 MG/2ML IJ SOLN: 4.0000 mg | Freq: Four times a day (QID) | INTRAMUSCULAR | Status: DC | PRN

## 2016-08-16 MED ORDER — ONDANSETRON HCL 4 MG PO TABS
4.0000 mg | ORAL_TABLET | Freq: Four times a day (QID) | ORAL | Status: DC | PRN
Start: 2016-08-16 — End: 2016-08-18

## 2016-08-16 MED ORDER — PENTAFLUOROPROP-TETRAFLUOROETH EX AERO
1.0000 "application " | INHALATION_SPRAY | CUTANEOUS | Status: DC | PRN
  Filled 2016-08-16: qty 30

## 2016-08-16 MED ORDER — SEVELAMER CARBONATE 800 MG PO TABS
2400.0000 mg | ORAL_TABLET | Freq: Three times a day (TID) | ORAL | Status: DC
  Administered 2016-08-16 – 2016-08-18 (×5): 2400 mg via ORAL
  Filled 2016-08-16 (×5): qty 3

## 2016-08-16 MED ORDER — FUROSEMIDE 80 MG PO TABS
80.0000 mg | ORAL_TABLET | Freq: Every day | ORAL | Status: DC
  Administered 2016-08-16 – 2016-08-18 (×3): 80 mg via ORAL
  Filled 2016-08-16 (×3): qty 1

## 2016-08-16 MED ORDER — ACETAMINOPHEN 325 MG PO TABS
650.0000 mg | ORAL_TABLET | Freq: Four times a day (QID) | ORAL | Status: DC | PRN
  Filled 2016-08-16: qty 2

## 2016-08-16 MED ORDER — PANTOPRAZOLE SODIUM 40 MG PO TBEC
40.0000 mg | DELAYED_RELEASE_TABLET | Freq: Every day | ORAL | Status: DC
  Administered 2016-08-16 – 2016-08-18 (×3): 40 mg via ORAL
  Filled 2016-08-16 (×3): qty 1

## 2016-08-16 MED ORDER — METHYLPREDNISOLONE SODIUM SUCC 125 MG IJ SOLR
60.0000 mg | INTRAMUSCULAR | Status: AC
  Administered 2016-08-16: 60 mg via INTRAVENOUS
  Filled 2016-08-16: qty 2

## 2016-08-16 NOTE — Consult Note (Signed)
[name], MD [facility]  [address], [place], [place] [address] Phone: [phone] Fax : [phone]  Consultation  Referring Provider:     Dr. Darvin Neighbours Primary Care Physician:  Alene Mires Elyse Jarvis, MD Primary Gastroenterologist:  Dr. Vicente Males          Reason for Consultation:     Obscure GI bleeding  Date of Admission:  08/16/2016 Date of Consultation:  08/16/2016         HPI:   Stanley [name] is a 56 y.o. male Who has multiple upper endoscopies colonoscopies and push arthroscopies in the past.  The patient has also had a capsule endoscopy that was normal.  The patient recently had a push enteroscopy by myself and a small AVM was treated with argon plasma coagulation.  The patient has had procedures done at this hospital and also at [facility]. The patient had been seen in the past by Dr. Candace Cruise who had also performs procedures on this patient  The patient has a history of alcohol abuse and cirrhosis with end-stage renal disease on hemodialysis.  The patient had come in with shortness of breath and was found to have anemia again.  The patient's hemoglobin 4 days ago was 8.8 and he was admitted with 6.8.  The patient continues to report black stools. The patient had a colonoscopy 4 days ago which showed a transverse colon polyp and multiple small AVMs that were not bleeding.  At that time he underwent an upper endoscopy with a small bowel enteroscopy as mentioned above.  There is no report of any abdominal pain.  The patient does report that at his last admission he had vomited blood after having a profuse nosebleed.  He denies any nose bleeding prior to this admission.  Past Medical History:  Diagnosis Date  . Alcohol abuse   . Cirrhosis ([place])   . Coronary artery disease 2009  . Diabetic peripheral neuropathy ([place])   . Drug abuse   . End stage renal disease on dialysis [facility]) NEPHROLOGIST-   DR [facility]  IN [place]   HEMODIALYSIS --   TUES/  THURS/  SAT  . GERD (gastroesophageal  reflux disease)   . Hyperlipidemia   . Hypertension   . PAD (peripheral artery disease) ([place])   . Renal insufficiency    Per pt, 32 oz fluid restriction per day  . S/P triple vessel bypass 06/09/2016   2009ish  . Suicidal ideation    & HOMICIDAL IDEATION --  06-16-2013   ADMITTED TO BEHAVIOR HEALTH    Past Surgical History:  Procedure Laterality Date  . AGILE CAPSULE N/A 06/19/2016   Procedure: AGILE CAPSULE;  Surgeon: [name], MD;  Location: [facility] ENDOSCOPY;  Service: Endoscopy;  Laterality: N/A;  . COLONOSCOPY WITH PROPOFOL N/A 06/18/2016   Procedure: COLONOSCOPY WITH PROPOFOL;  Surgeon: [name], MD;  Location: [facility] ENDOSCOPY;  Service: Endoscopy;  Laterality: N/A;  . COLONOSCOPY WITH PROPOFOL N/A 08/12/2016   Procedure: COLONOSCOPY WITH PROPOFOL;  Surgeon: [name], MD;  Location: [facility] ENDOSCOPY;  Service: Endoscopy;  Laterality: N/A;  . CORONARY ANGIOPLASTY  ?   PT UNABLE TO TELL IF  BEFORE OR AFTER  CABG  . CORONARY ARTERY BYPASS GRAFT  2008  ([place] , [place])   3 VESSEL  . DIALYSIS FISTULA CREATION  LAST SURGERY  APPOX  2008  . ENTEROSCOPY N/A 05/10/2016   Procedure: ENTEROSCOPY;  Surgeon: Jerene Bears, MD;  Location: [place];  Service: Gastroenterology;  Laterality: N/A;  .  ENTEROSCOPY N/A 08/12/2016   Procedure: ENTEROSCOPY;  Surgeon: [name], MD;  Location: [facility] ENDOSCOPY;  Service: Endoscopy;  Laterality: N/A;  . ESOPHAGOGASTRODUODENOSCOPY N/A 05/07/2015   Procedure: ESOPHAGOGASTRODUODENOSCOPY (EGD);  Surgeon: Hulen Luster, MD;  Location: [facility] ENDOSCOPY;  Service: Endoscopy;  Laterality: N/A;  . ESOPHAGOGASTRODUODENOSCOPY (EGD) WITH PROPOFOL N/A 05/17/2015   Procedure: ESOPHAGOGASTRODUODENOSCOPY (EGD) WITH PROPOFOL;  Surgeon: [name], MD;  Location: [facility] ENDOSCOPY;  Service: Endoscopy;  Laterality: N/A;  . ESOPHAGOGASTRODUODENOSCOPY (EGD) WITH PROPOFOL N/A 01/20/2016   Procedure: ESOPHAGOGASTRODUODENOSCOPY (EGD) WITH PROPOFOL;  Surgeon: [name], MD;  Location: [facility]  ENDOSCOPY;  Service: Endoscopy;  Laterality: N/A;  . ESOPHAGOGASTRODUODENOSCOPY (EGD) WITH PROPOFOL N/A 04/17/2016   Procedure: ESOPHAGOGASTRODUODENOSCOPY (EGD) WITH PROPOFOL;  Surgeon: Lin Landsman, MD;  Location: [facility] ENDOSCOPY;  Service: Gastroenterology;  Laterality: N/A;  . ESOPHAGOGASTRODUODENOSCOPY (EGD) WITH PROPOFOL  05/09/2016   Procedure: ESOPHAGOGASTRODUODENOSCOPY (EGD) WITH PROPOFOL;  Surgeon: Jerene Bears, MD;  Location: [place];  Service: Endoscopy;;  . ESOPHAGOGASTRODUODENOSCOPY (EGD) WITH PROPOFOL N/A 06/16/2016   Procedure: ESOPHAGOGASTRODUODENOSCOPY (EGD) WITH PROPOFOL;  Surgeon: [name], MD;  Location: [facility] ENDOSCOPY;  Service: Endoscopy;  Laterality: N/A;  . GIVENS CAPSULE STUDY N/A 05/07/2016   Procedure: GIVENS CAPSULE STUDY;  Surgeon: Doran Stabler, MD;  Location: [place];  Service: Endoscopy;  Laterality: N/A;  . MANDIBULAR HARDWARE REMOVAL N/A 07/29/2013   Procedure: REMOVAL OF ARCH BARS;  Surgeon: Theodoro Kos, DO;  Location: [place];  Service: Plastics;  Laterality: N/A;  . ORIF MANDIBULAR FRACTURE N/A 06/05/2013   Procedure: REPAIR OF MANDIBULAR FRACTURE x 2 with maxillo-mandibular fixation ;  Surgeon: Theodoro Kos, DO;  Location: [place];  Service: Plastics;  Laterality: N/A;  . PERIPHERAL ARTERIAL STENT GRAFT Left     Prior to Admission medications   Medication Sig Start Date End Date Taking? Authorizing Provider  albuterol (PROVENTIL HFA;VENTOLIN HFA) 108 (90 Base) MCG/ACT inhaler Inhale 4-6 puffs by mouth every 4 hours as needed for wheezing, cough, and/or shortness of breath 07/11/16  Yes Hinda Kehr, MD  amLODipine (NORVASC) 5 MG tablet Take 5 mg by mouth daily.   Yes [provider]  aspirin EC 81 MG tablet Take 81 mg by mouth daily.   Yes [provider]  atorvastatin (LIPITOR) 40 MG tablet Take 40 mg by mouth daily. 04/19/16  Yes [provider]  budesonide-formoterol (SYMBICORT) 160-4.5 MCG/ACT  inhaler Inhale 2 puffs into the lungs daily.   Yes [provider]  furosemide (LASIX) 80 MG tablet Take 80 mg by mouth daily. 05/11/16  Yes [provider]  gabapentin (NEURONTIN) 300 MG capsule Take 1 capsule (300 mg total) by mouth daily. Patient taking differently: Take 300 mg by mouth at bedtime.  03/27/15  Yes Mody, Ulice Bold, MD  labetalol (NORMODYNE) 100 MG tablet Take 1 tablet (100 mg total) by mouth daily. 01/22/16  Yes Epifanio Lesches, MD  Multiple Vitamins-Minerals-FA (DIALYVITE SUPREME D) 3 MG TABS Take 1 tablet by mouth daily. 05/27/16  Yes [provider]  nitroGLYCERIN (NITROSTAT) 0.4 MG SL tablet Place 1 tablet (0.4 mg total) under the tongue every 5 (five) minutes as needed. Patient taking differently: Place 0.4 mg under the tongue every 5 (five) minutes as needed for chest pain.  04/12/16  Yes Wende Bushy, MD  pantoprazole (PROTONIX) 40 MG tablet Take 1 tablet (40 mg total) by mouth daily. 05/10/16  Yes Ghimire, Henreitta Leber, MD  predniSONE (DELTASONE) 10 MG tablet 4 tablets daily for three days  08/12/16  Yes Wieting, Richard, MD  sevelamer carbonate (RENVELA) 800 MG tablet Take 3 tablets (2,400 mg total) by mouth 3 (three) times daily with meals. 06/01/16  Yes Theodoro Grist, MD  hydrOXYzine (VISTARIL) 25 MG capsule Take 1 capsule (25 mg total) by mouth 3 (three) times daily as needed for itching. Patient not taking: Reported on 08/16/2016 02/25/16   Hagler, Wende Crease L, PA-C  levofloxacin (LEVAQUIN) 500 MG tablet One tab daily after dialysis q48 hours for three more doses Patient not taking: Reported on 08/16/2016 08/12/16   Loletha Grayer, MD    Family History  Problem Relation Age of Onset  . Colon cancer Mother   . Cancer Father   . Cancer Sister   . Kidney disease Brother      Social History  Substance Use Topics  . Smoking status: Current Every Day Smoker    Packs/day: 0.15    Years: 40.00    Types: Cigarettes  . Smokeless tobacco: Never Used  .  Alcohol use No     Comment: pt reports quitting after learning about cirrhosis    Allergies as of 08/16/2016  . (No Known Allergies)    Review of Systems:    All systems reviewed and negative except where noted in HPI.   Physical Exam:  Vital signs in last 24 hours: Temp:  [98 F (36.7 C)-98.8 F (37.1 C)] 98.6 F (37 C) (06/05 1507) Pulse Rate:  [82-97] 91 (06/05 1507) Resp:  [9-22] 20 (06/05 1507) BP: (95-136)/(54-77) 129/76 (06/05 1507) SpO2:  [96 %-100 %] 100 % (06/05 1507) Weight:  [175 lb (79.4 kg)-206 lb 1.6 oz (93.5 kg)] 206 lb 1.6 oz (93.5 kg) (06/05 1538) Last BM Date: 08/15/16 General:   Pleasant, cooperative in NAD Head:  Normocephalic and atraumatic. Eyes:   No icterus.   Conjunctiva pink. PERRLA. Ears:  Normal auditory acuity. Neck:  Supple; no masses or thyroidomegaly Lungs: Respirations even and unlabored. Lungs clear to auscultation bilaterally.   No wheezes, crackles, or rhonchi.  Heart:  Regular rate and rhythm;  Without murmur, clicks, rubs or gallops Abdomen:  Soft, nondistended, nontender. Normal bowel sounds. No appreciable masses or hepatomegaly.  No rebound or guarding.  Rectal:  Not performed. Msk:  Symmetrical without gross deformities.   Extremities:  Without edema, cyanosis or clubbing. Neurologic:  Alert and oriented x3;  grossly normal neurologically. Skin:  Intact without significant lesions or rashes. Cervical Nodes:  No significant cervical adenopathy. Psych:  Alert and cooperative. Normal affect.  LAB RESULTS:  Recent Labs  08/16/16 0748 08/16/16 1655  WBC 11.6*  --   HGB 6.8* 7.6*  HCT 22.0*  --   PLT 238  --    BMET  Recent Labs  08/16/16 0748  NA 139  K 4.4  CL 99*  CO2 27  GLUCOSE 88  BUN 76*  CREATININE 6.95*  CALCIUM 8.3*   LFT  Recent Labs  08/16/16 0748  PROT 6.4*  ALBUMIN 2.9*  AST 58*  ALT 54  ALKPHOS 175*  BILITOT 0.9   PT/INR  Recent Labs  08/16/16 0748  LABPROT 15.0  INR 1.17     STUDIES: Dg Chest 2 View  Result Date: 08/16/2016 CLINICAL DATA:  56 year old current smoker presenting with acute onset of shortness of breath. Current history of hypertension and coronary artery disease, post CABG in 2008. EXAM: CHEST  2 VIEW COMPARISON:  08/09/2016, 07/11/2016 and earlier, including CTA chest 03/20/2016. FINDINGS: Prior sternotomy for CABG. Cardiac silhouette moderately enlarged,  unchanged dating back to 2015. Thoracic aorta atherosclerotic, unchanged. Hilar and mediastinal contours otherwise unremarkable. Emphysematous changes in the upper lobes, prominent bronchovascular markings diffusely and moderate central peribronchial thickening, unchanged. No new pulmonary parenchymal abnormalities. Normal pulmonary vascularity. No pleural effusions. Visualized bony thorax intact. IMPRESSION: Stable cardiomegaly. COPD/emphysema. No acute cardiopulmonary disease. Electronically Signed   By: Evangeline Dakin M.D.   On: 08/16/2016 08:11      Impression / Plan:   Stanley [name] is a 56 y.o. y/o male with A long-standing history of anemia with multiple EGDs, colonoscopies and a capsule endoscopy.  The patient has also had at least 2 small bowel enteroscopy's.  No active bleeding has been seen on his most recent EGD/colonoscopy with push enteroscopy 4 days ago.  He also had a capsule endoscopy in April that did not show any sign of GI blood loss.  I would continue to treat this patient with blood transfusions and iron supplementation.  The patient may need double balloon enteroscopy at [facility] or [facility] to examine the rest of the small bowel for possible small bowel cause of his anemia.   Thank you for involving me in the care of this patient.      LOS: 0 days   [name], MD  08/16/2016, 7:53 PM   Note: This dictation was prepared with Dragon dictation along with smaller phrase technology. Any transcriptional errors that result from this process are unintentional.

## 2016-08-16 NOTE — Progress Notes (Signed)
PT Cancellation Note  Patient Details Name: Stanley [name] MRN: [id number] DOB: [birth year]   Cancelled Treatment:    Reason Eval/Treat Not Completed: Patient at procedure or test/unavailable (Pt currently off floor for dialysis).  Will continue to follow acutely.   Collie Siad PT, DPT 08/16/2016, 1:59 PM

## 2016-08-16 NOTE — ED Notes (Signed)
Spoke with Katharine Look, RN in dialysis. Patient will go to dialysis treatment before going to room 203. Danae Chen, RN from [facility] notified and aware.

## 2016-08-16 NOTE — ED Provider Notes (Signed)
[facility] Emergency Department Provider Note   ____________________________________________    I have reviewed the triage vital signs and the nursing notes.   HISTORY  Chief Complaint Shortness of Breath     HPI Stanley [name] is a 57 y.o. male with past medical history as listed below presents with complaints of shortness of breath. Patient attributes this to ascites although he also has end-stage renal disease and is due for dialysis today. Admits the hospital last week for hyperkalemia. He denies fevers or chills. No cough. No chest pain. Reports abdomen feels tight but is nontender and not painful. He reports it is interfering with his breathing.   Past Medical History:  Diagnosis Date  . Alcohol abuse   . Cirrhosis ([place])   . Coronary artery disease 2009  . Diabetic peripheral neuropathy ([place])   . Drug abuse   . End stage renal disease on dialysis [facility]) NEPHROLOGIST-   DR [facility]  IN [place]   HEMODIALYSIS --   TUES/  THURS/  SAT  . GERD (gastroesophageal reflux disease)   . Hyperlipidemia   . Hypertension   . PAD (peripheral artery disease) ([place])   . Renal insufficiency    Per pt, 32 oz fluid restriction per day  . S/P triple vessel bypass 06/09/2016   2009ish  . Suicidal ideation    & HOMICIDAL IDEATION --  06-16-2013   ADMITTED TO BEHAVIOR HEALTH    Patient Active Problem List   Diagnosis Date Noted  . Anemia   . Heme positive stool   . Ulceration of intestine   . Benign neoplasm of transverse colon   . Acute gastrointestinal hemorrhage   . Esophageal candidiasis ([place])   . Angiodysplasia of intestinal tract   . Acute respiratory failure with hypoxia ([place]) 07/03/2016  . GI bleeding 06/24/2016  . Rectal bleeding 06/14/2016  . Anemia of chronic disease 06/01/2016  . MRSA carrier 06/01/2016  . Chronic renal failure 05/23/2016  . Ischemic heart disease 05/23/2016  . Angiodysplasia of small intestine   . Melena   . Small  bowel bleed not requiring more than 4 units of blood in 24 hours, ICU, or surgery   . Anemia due to chronic blood loss   . Abdominal pain 05/05/2016  . Acute posthemorrhagic anemia 04/17/2016  . Gastrointestinal bleed 04/17/2016  . History of esophagogastroduodenoscopy (EGD) 04/17/2016  . Elevated troponin 04/17/2016  . Alcohol abuse 04/17/2016  . Upper GI bleed 01/19/2016  . Blood in stool   . Angiodysplasia of stomach and duodenum with hemorrhage   . Gastritis   . Esophagitis, unspecified   . GI bleed 05/16/2015  . Acute GI bleeding   . Symptomatic anemia 04/30/2015  . HTN (hypertension) 04/06/2015  . GERD (gastroesophageal reflux disease) 04/06/2015  . HLD (hyperlipidemia) 04/06/2015  . Dyspnea 04/06/2015  . Cirrhosis of liver with ascites ([place]) 04/06/2015  . Ascites 04/06/2015  . GIB (gastrointestinal bleeding) 03/23/2015  . Homicidal ideation 06/19/2013  . Suicidal intent 06/19/2013  . Homicidal ideations 06/19/2013  . Hyperkalemia 06/16/2013  . Mandible fracture ([place]) 06/05/2013  . Fracture, mandible ([place]) 06/02/2013  . Coronary atherosclerosis of native coronary artery 06/02/2013  . ESRD on dialysis ([place]) 06/02/2013  . Mandible open fracture ([place]) 06/02/2013    Past Surgical History:  Procedure Laterality Date  . AGILE CAPSULE N/A 06/19/2016   Procedure: AGILE CAPSULE;  Surgeon: [name], MD;  Location: [facility] ENDOSCOPY;  Service: Endoscopy;  Laterality: N/A;  . COLONOSCOPY WITH  PROPOFOL N/A 06/18/2016   Procedure: COLONOSCOPY WITH PROPOFOL;  Surgeon: [name], MD;  Location: [facility] ENDOSCOPY;  Service: Endoscopy;  Laterality: N/A;  . COLONOSCOPY WITH PROPOFOL N/A 08/12/2016   Procedure: COLONOSCOPY WITH PROPOFOL;  Surgeon: [name], MD;  Location: [facility] ENDOSCOPY;  Service: Endoscopy;  Laterality: N/A;  . CORONARY ANGIOPLASTY  ?   PT UNABLE TO TELL IF  BEFORE OR AFTER  CABG  . CORONARY ARTERY BYPASS GRAFT  2008  ([place] , [place])   3 VESSEL  . DIALYSIS FISTULA  CREATION  LAST SURGERY  APPOX  2008  . ENTEROSCOPY N/A 05/10/2016   Procedure: ENTEROSCOPY;  Surgeon: Jerene Bears, MD;  Location: [place];  Service: Gastroenterology;  Laterality: N/A;  . ENTEROSCOPY N/A 08/12/2016   Procedure: ENTEROSCOPY;  Surgeon: [name], MD;  Location: [facility] ENDOSCOPY;  Service: Endoscopy;  Laterality: N/A;  . ESOPHAGOGASTRODUODENOSCOPY N/A 05/07/2015   Procedure: ESOPHAGOGASTRODUODENOSCOPY (EGD);  Surgeon: Hulen Luster, MD;  Location: [facility] ENDOSCOPY;  Service: Endoscopy;  Laterality: N/A;  . ESOPHAGOGASTRODUODENOSCOPY (EGD) WITH PROPOFOL N/A 05/17/2015   Procedure: ESOPHAGOGASTRODUODENOSCOPY (EGD) WITH PROPOFOL;  Surgeon: [name], MD;  Location: [facility] ENDOSCOPY;  Service: Endoscopy;  Laterality: N/A;  . ESOPHAGOGASTRODUODENOSCOPY (EGD) WITH PROPOFOL N/A 01/20/2016   Procedure: ESOPHAGOGASTRODUODENOSCOPY (EGD) WITH PROPOFOL;  Surgeon: [name], MD;  Location: [facility] ENDOSCOPY;  Service: Endoscopy;  Laterality: N/A;  . ESOPHAGOGASTRODUODENOSCOPY (EGD) WITH PROPOFOL N/A 04/17/2016   Procedure: ESOPHAGOGASTRODUODENOSCOPY (EGD) WITH PROPOFOL;  Surgeon: Lin Landsman, MD;  Location: [facility] ENDOSCOPY;  Service: Gastroenterology;  Laterality: N/A;  . ESOPHAGOGASTRODUODENOSCOPY (EGD) WITH PROPOFOL  05/09/2016   Procedure: ESOPHAGOGASTRODUODENOSCOPY (EGD) WITH PROPOFOL;  Surgeon: Jerene Bears, MD;  Location: [place];  Service: Endoscopy;;  . ESOPHAGOGASTRODUODENOSCOPY (EGD) WITH PROPOFOL N/A 06/16/2016   Procedure: ESOPHAGOGASTRODUODENOSCOPY (EGD) WITH PROPOFOL;  Surgeon: [name], MD;  Location: [facility] ENDOSCOPY;  Service: Endoscopy;  Laterality: N/A;  . GIVENS CAPSULE STUDY N/A 05/07/2016   Procedure: GIVENS CAPSULE STUDY;  Surgeon: Doran Stabler, MD;  Location: [place];  Service: Endoscopy;  Laterality: N/A;  . MANDIBULAR HARDWARE REMOVAL N/A 07/29/2013   Procedure: REMOVAL OF ARCH BARS;  Surgeon: Theodoro Kos, DO;  Location: [place];  Service:  Plastics;  Laterality: N/A;  . ORIF MANDIBULAR FRACTURE N/A 06/05/2013   Procedure: REPAIR OF MANDIBULAR FRACTURE x 2 with maxillo-mandibular fixation ;  Surgeon: Theodoro Kos, DO;  Location: [place];  Service: Plastics;  Laterality: N/A;  . PERIPHERAL ARTERIAL STENT GRAFT Left     Prior to Admission medications   Medication Sig Start Date End Date Taking? Authorizing Provider  albuterol (PROVENTIL HFA;VENTOLIN HFA) 108 (90 Base) MCG/ACT inhaler Inhale 4-6 puffs by mouth every 4 hours as needed for wheezing, cough, and/or shortness of breath 07/11/16   Hinda Kehr, MD  amLODipine (NORVASC) 5 MG tablet Take 5 mg by mouth daily.    [provider]  atorvastatin (LIPITOR) 40 MG tablet Take 40 mg by mouth daily. 04/19/16   [provider]  budesonide-formoterol (SYMBICORT) 160-4.5 MCG/ACT inhaler Inhale 2 puffs into the lungs daily.    [provider]  furosemide (LASIX) 80 MG tablet Take 80 mg by mouth daily. 05/11/16   [provider]  gabapentin (NEURONTIN) 300 MG capsule Take 1 capsule (300 mg total) by mouth daily. Patient taking differently: Take 300 mg by mouth at bedtime.  03/27/15   Bettey Costa, MD  hydrOXYzine (VISTARIL) 25 MG capsule Take 1 capsule (25 mg total) by mouth 3 (  three) times daily as needed for itching. 02/25/16   Hagler, Jami L, PA-C  labetalol (NORMODYNE) 100 MG tablet Take 1 tablet (100 mg total) by mouth daily. 01/22/16   Epifanio Lesches, MD  levofloxacin (LEVAQUIN) 500 MG tablet One tab daily after dialysis q48 hours for three more doses 08/12/16   Wieting, Richard, MD  Multiple Vitamins-Minerals-FA (DIALYVITE SUPREME D) 3 MG TABS Take 1 tablet by mouth daily. 05/27/16   [provider]  nitroGLYCERIN (NITROSTAT) 0.4 MG SL tablet Place 1 tablet (0.4 mg total) under the tongue every 5 (five) minutes as needed. Patient taking differently: Place 0.4 mg under the tongue every 5 (five) minutes as needed for chest pain.  04/12/16   Wende Bushy, MD  pantoprazole (PROTONIX) 40 MG tablet Take 1 tablet (40 mg total) by mouth daily. 05/10/16   Ghimire, Henreitta Leber, MD  predniSONE (DELTASONE) 10 MG tablet 4 tablets daily for three days 08/12/16   Loletha Grayer, MD  sevelamer carbonate (RENVELA) 800 MG tablet Take 3 tablets (2,400 mg total) by mouth 3 (three) times daily with meals. 06/01/16   Theodoro Grist, MD     Allergies Patient has no known allergies.  Family History  Problem Relation Age of Onset  . Colon cancer Mother   . Cancer Father   . Cancer Sister     Social History Social History  Substance Use Topics  . Smoking status: Current Every Day Smoker    Packs/day: 0.15    Years: 40.00    Types: Cigarettes  . Smokeless tobacco: Never Used  . Alcohol use No     Comment: pt reports quitting after learning about cirrhosis    Review of Systems  Constitutional: No fever/chills Eyes: No visual changes.  ENT: No sore throat. Cardiovascular: Denies chest pain. Respiratory: Positive short of breath, no cough Gastrointestinal: No abdominal pain.  Distended abdomen from ascites Genitourinary: Negative for dysuria. Musculoskeletal: Negative for back pain. Skin: Negative for rash. Neurological: Negative for headaches or weakness   ____________________________________________   PHYSICAL EXAM:  VITAL SIGNS: ED Triage Vitals  Enc Vitals Group     BP 08/16/16 0735 110/67     Pulse Rate 08/16/16 0735 87     Resp 08/16/16 0735 16     Temp 08/16/16 0735 98 F (36.7 C)     Temp Source 08/16/16 0735 Oral     SpO2 08/16/16 0735 100 %     Weight 08/16/16 0735 79.4 kg (175 lb)     Height 08/16/16 0735 1.829 m (6')     Head Circumference --      Peak Flow --      Pain Score 08/16/16 0734 7     Pain Loc --      Pain Edu? --      Excl. in [place]? --     Constitutional: Alert and oriented. No acute distress. Eyes: Conjunctivae are normal.   Mouth/Throat: Mucous membranes are moist.   Neck:  Painless  ROM Cardiovascular: Normal rate, regular rhythm. Grossly normal heart sounds.  Good peripheral circulation. Respiratory: Normal respiratory effort.  No retractions. Scattered wheezes. Gastrointestinal: Nontender, distended abdomen positive fluid wave Genitourinary: deferred Musculoskeletal:   Warm and well perfused. 1-2+ edema bilaterally lower extremity Neurologic:  Normal speech and language. No gross focal neurologic deficits are appreciated.  Skin:  Skin is warm, dry and intact. No rash noted. Psychiatric: Mood and affect are normal. Speech and behavior are normal.  ____________________________________________   LABS (all labs  ordered are listed, but only abnormal results are displayed)  Labs Reviewed  CBC - Abnormal; Notable for the following:       Result Value   WBC 11.6 (*)    RBC 2.39 (*)    Hemoglobin 6.8 (*)    HCT 22.0 (*)    MCHC 31.0 (*)    RDW 21.5 (*)    All other components within normal limits  COMPREHENSIVE METABOLIC PANEL - Abnormal; Notable for the following:    Chloride 99 (*)    BUN 76 (*)    Creatinine, Ser 6.95 (*)    Calcium 8.3 (*)    Total Protein 6.4 (*)    Albumin 2.9 (*)    AST 58 (*)    Alkaline Phosphatase 175 (*)    GFR calc non Af Amer 8 (*)    GFR calc Af Amer 9 (*)    All other components within normal limits  TROPONIN I - Abnormal; Notable for the following:    Troponin I 0.09 (*)    All other components within normal limits  PROTIME-INR  APTT   ____________________________________________  EKG  ED ECG REPORT I, Lavonia Drafts, the attending physician, personally viewed and interpreted this ECG.  Date: 08/16/2016  Rhythm: normal sinus rhythm QRS Axis: normal Intervals: normal ST/T Wave abnormalities: non specific changes Conduction Disturbances: none    ____________________________________________  RADIOLOGY  cxr  pending ____________________________________________   PROCEDURES  Procedure(s) performed:  No    Critical Care performed: no ____________________________________________   INITIAL IMPRESSION / ASSESSMENT AND PLAN / ED COURSE  Pertinent labs & imaging results that were available during my care of the patient were reviewed by me and considered in my medical decision making (see chart for details).  Patient presents with complaints of shortness of breath. He has mild wheezes on chest exam however I suspect his ascites is causing difficulty for him. We will check labs, EKG, chest x-ray. Patient with anemia, 2 g drop since last week, apparently they were working up for GI bleed as well he did leave the hospital early because he had his niece's graduation. Given the shortness of breath, anemia ascites she will be admitted for further evaluation and management.    ____________________________________________   FINAL CLINICAL IMPRESSION(S) / ED DIAGNOSES  Final diagnoses:  Shortness of breath  Ascites due to alcoholic cirrhosis (HCC)  Gastrointestinal hemorrhage, unspecified gastrointestinal hemorrhage type      NEW MEDICATIONS STARTED DURING THIS VISIT:  New Prescriptions   No medications on file     Note:  This document was prepared using Dragon voice recognition software and may include unintentional dictation errors.    Lavonia Drafts, MD 08/16/16 [id number]

## 2016-08-16 NOTE — Progress Notes (Signed)
[facility], [place] 08/16/16  Subjective:  Patient well known to [name]. He was seen a few days ago for gastrointestinal bleeding. He had photocoagulation of angiectasia is in the small and large intestine on 6/1. Thereafter he left AGAINST MEDICAL ADVICE. He presents now with shortness of breath abdominal distention, and lower extremity edema. He is seen and evaluated during hemodialysis at the moment.   Objective:  Vital signs in last 24 hours:  Temp:  [98 F (36.7 C)-98.5 F (36.9 C)] 98.5 F (36.9 C) (06/05 1045) Pulse Rate:  [87-97] 87 (06/05 1130) Resp:  [13-20] 13 (06/05 1130) BP: (95-118)/(54-72) 111/70 (06/05 1130) SpO2:  [96 %-100 %] 99 % (06/05 1047) Weight:  [79.4 kg (175 lb)-79.4 kg (175 lb 0.7 oz)] 79.4 kg (175 lb 0.7 oz) (06/05 1045)  Weight change:  Filed Weights   08/16/16 0735 08/16/16 1045  Weight: 79.4 kg (175 lb) 79.4 kg (175 lb 0.7 oz)    Intake/Output:   No intake or output data in the 24 hours ending 08/16/16 1136   Physical Exam: General: No acute distress, laying in the bed  HEENT Anicteric, dry oral mucous membranes  Neck supple  Pulm/lungs Normal breathing, no crackles  CVS/Heart Regular, no rub  Abdomen:  Distended, ascites  Extremities: + pitting edema bilaterally; Left > right  Neurologic: Alert, oriented. able to answer questions  Skin: No acute rashes  Access: AV fistula       Basic Metabolic Panel:   Recent Labs Lab 08/09/16 1959 08/10/16 0358 08/11/16 0432 08/12/16 1257 08/16/16 0748  NA  --  141 137 136 139  K 4.4 4.6 3.6 3.9 4.4  CL  --  100* 95*  --  99*  CO2  --  28 31  --  27  GLUCOSE  --  173* 100* 128* 88  BUN  --  56* 30*  --  76*  CREATININE  --  7.07* 4.93*  --  6.95*  CALCIUM  --  8.2* 7.9*  --  8.3*     CBC:  Recent Labs Lab 08/10/16 0358 08/10/16 1638 08/11/16 0432 08/12/16 0513 08/12/16 1257 08/16/16 0748  WBC 9.6  --  9.0 9.0  --  11.6*  HGB 6.3* 7.7* 7.7* 7.9*  8.8* 6.8*  HCT 19.9* 24.1* 24.0* 24.7* 26.0* 22.0*  MCV 83.7  --  84.2 85.6  --  91.7  PLT 368  --  297 287  --  238      Lab Results  Component Value Date   HEPBSAG Negative 08/09/2016   HEPBSAB Reactive 06/01/2016   HEPBIGM Negative 06/01/2016      Microbiology:  Recent Results (from the past 240 hour(s))  MRSA PCR Screening     Status: None   Collection Time: 08/11/16  6:00 PM  Result Value Ref Range Status   MRSA by PCR NEGATIVE NEGATIVE Final    Comment:        The GeneXpert MRSA Assay (FDA approved for NASAL specimens only), is one component of a comprehensive MRSA colonization surveillance program. It is not intended to diagnose MRSA infection nor to guide or monitor treatment for MRSA infections.     Coagulation Studies:  Recent Labs  08/16/16 0748  LABPROT 15.0  INR 1.17    Urinalysis: No results for input(s): COLORURINE, LABSPEC, PHURINE, GLUCOSEU, HGBUR, BILIRUBINUR, KETONESUR, PROTEINUR, UROBILINOGEN, NITRITE, LEUKOCYTESUR in the last 72 hours.  Invalid input(s): APPERANCEUR    Imaging: Dg Chest 2 View  Result  Date: 08/16/2016 CLINICAL DATA:  56 year old current smoker presenting with acute onset of shortness of breath. Current history of hypertension and coronary artery disease, post CABG in 2008. EXAM: CHEST  2 VIEW COMPARISON:  08/09/2016, 07/11/2016 and earlier, including CTA chest 03/20/2016. FINDINGS: Prior sternotomy for CABG. Cardiac silhouette moderately enlarged, unchanged dating back to 2015. Thoracic aorta atherosclerotic, unchanged. Hilar and mediastinal contours otherwise unremarkable. Emphysematous changes in the upper lobes, prominent bronchovascular markings diffusely and moderate central peribronchial thickening, unchanged. No new pulmonary parenchymal abnormalities. Normal pulmonary vascularity. No pleural effusions. Visualized bony thorax intact. IMPRESSION: Stable cardiomegaly. COPD/emphysema. No acute cardiopulmonary disease.  Electronically Signed   By: Evangeline Dakin M.D.   On: 08/16/2016 08:11     Medications:   . sodium chloride    . sodium chloride    . sodium chloride     . docusate sodium  100 mg Oral BID  . ipratropium-albuterol  3 mL Nebulization STAT  . methylPREDNISolone (SOLU-MEDROL) injection  60 mg Intravenous STAT  . sodium chloride flush  3 mL Intravenous Q12H     Assessment/ Plan:  56 y.o. male with ESRD on hemodialysis, hypertension, peripheral vascular disease, coronary artery disease, cirrhosis, substance abuse, recurrent GI bleed, admission for SOB 07/03/16, admission for GI bleed 08/09/16 and 08/16/16.   TTS [facility] Right arm AVF   1. End stage renal disease: TTS. -  Patient seen and evaluated during hemodialysis today. Ultrafiltration target is only 1.5 kg as the patient is relatively hypovolemic given acute GI bleed. We will plan for dialysis tomorrow again.  2. Anemia of chronic kidney disease with recent GI blood loss:  - Hemoglobin dropped to 6.8. Patient had argon coagulation of angiectasia is in the small and large intestine on 08/12/2016. Agree with blood transfusion. Recommend repeat gastroenterology consultation.  3. Secondary Hyperparathyroidism:  - Follow-up PTH and serum phosphorus today. Previously the patient was on Renvela.    4.  Lower extremity edema - We will continue ultrafiltration with dialysis for this issue. We will conservatively with ultrafiltration today in the setting of acute GI bleed. We will plan for dialysis again tomorrow as well.   LOS: 0 Anthonette Legato 6/5/201811:36 AM  [facility] [place], [place]

## 2016-08-16 NOTE — ED Triage Notes (Signed)
Patient presents to ED via [facility] from home with c/o SOB. Patient is a dialysis patient (Saturday, Tuesday, Thursday). Last treatment was Saturday. Patient has an appointment today at 1100. Patient states, "I don't care about that. I need this fluid taken off my belly". Patient with bilateral lower extremity edema. Abdomen is taut and distended.

## 2016-08-16 NOTE — H&P (Signed)
[facility] NAME: Stanley [name]    MR#:  [id number]  DATE OF BIRTH:  [birth year]  DATE OF ADMISSION:  08/16/2016  PRIMARY CARE PHYSICIAN: Alene Mires Elyse Jarvis, MD   REQUESTING/REFERRING PHYSICIAN: Dr. Corky Downs  CHIEF COMPLAINT:   Chief Complaint  Patient presents with  . Shortness of Breath    HISTORY OF PRESENT ILLNESS:  Stanley [name]  is a 55 y.o. male with a known history of Alcohol abuse, hypertension, cirrhosis, CAD, end-stage renal disease on hemodialysis Tuesday to Thursday and Saturday presents to the emergency room complaining of shortness of breath and abdominal distention. Patient was recently in the hospital for similar complaints along with a GI bleed and anemia. He had EGD/enteroscopy and colonoscopy with angioectasia of which were treated with argon. Patient left AGAINST MEDICAL ADVICE to attend a graduation ceremony. Since leaving the hospital he has not done well. Continues to have wheezing. Recurrent falls and difficult to walk due to swelling in his legs. He feels he needs repeat paracentesis which was done approximately a month back.  Today he has been found to have worsening anemia. Patient is due for hemodialysis today.  Being admitted for GI bleed, anemia and fluid overload.  PAST MEDICAL HISTORY:   Past Medical History:  Diagnosis Date  . Alcohol abuse   . Cirrhosis ([place])   . Coronary artery disease 2009  . Diabetic peripheral neuropathy ([place])   . Drug abuse   . End stage renal disease on dialysis [facility]) NEPHROLOGIST-   DR [facility]  IN [place]   HEMODIALYSIS --   TUES/  THURS/  SAT  . GERD (gastroesophageal reflux disease)   . Hyperlipidemia   . Hypertension   . PAD (peripheral artery disease) ([place])   . Renal insufficiency    Per pt, 32 oz fluid restriction per day  . S/P triple vessel bypass 06/09/2016   2009ish  . Suicidal ideation    & HOMICIDAL IDEATION --  06-16-2013   ADMITTED TO BEHAVIOR  HEALTH    PAST SURGICAL HISTORY:   Past Surgical History:  Procedure Laterality Date  . AGILE CAPSULE N/A 06/19/2016   Procedure: AGILE CAPSULE;  Surgeon: [name], MD;  Location: [facility] ENDOSCOPY;  Service: Endoscopy;  Laterality: N/A;  . COLONOSCOPY WITH PROPOFOL N/A 06/18/2016   Procedure: COLONOSCOPY WITH PROPOFOL;  Surgeon: [name], MD;  Location: [facility] ENDOSCOPY;  Service: Endoscopy;  Laterality: N/A;  . COLONOSCOPY WITH PROPOFOL N/A 08/12/2016   Procedure: COLONOSCOPY WITH PROPOFOL;  Surgeon: [name], MD;  Location: [name] ENDOSCOPY;  Service: Endoscopy;  Laterality: N/A;  . CORONARY ANGIOPLASTY  ?   PT UNABLE TO TELL IF  BEFORE OR AFTER  CABG  . CORONARY ARTERY BYPASS GRAFT  2008  ([place] , [place])   3 VESSEL  . DIALYSIS FISTULA CREATION  LAST SURGERY  APPOX  2008  . ENTEROSCOPY N/A 05/10/2016   Procedure: ENTEROSCOPY;  Surgeon: Jerene Bears, MD;  Location: [place];  Service: Gastroenterology;  Laterality: N/A;  . ENTEROSCOPY N/A 08/12/2016   Procedure: ENTEROSCOPY;  Surgeon: [name], MD;  Location: [facility] ENDOSCOPY;  Service: Endoscopy;  Laterality: N/A;  . ESOPHAGOGASTRODUODENOSCOPY N/A 05/07/2015   Procedure: ESOPHAGOGASTRODUODENOSCOPY (EGD);  Surgeon: Hulen Luster, MD;  Location: [facility] ENDOSCOPY;  Service: Endoscopy;  Laterality: N/A;  . ESOPHAGOGASTRODUODENOSCOPY (EGD) WITH PROPOFOL N/A 05/17/2015   Procedure: ESOPHAGOGASTRODUODENOSCOPY (EGD) WITH PROPOFOL;  Surgeon: [name], MD;  Location: [facility] ENDOSCOPY;  Service: Endoscopy;  Laterality: N/A;  .  ESOPHAGOGASTRODUODENOSCOPY (EGD) WITH PROPOFOL N/A 01/20/2016   Procedure: ESOPHAGOGASTRODUODENOSCOPY (EGD) WITH PROPOFOL;  Surgeon: [name], MD;  Location: [facility] ENDOSCOPY;  Service: Endoscopy;  Laterality: N/A;  . ESOPHAGOGASTRODUODENOSCOPY (EGD) WITH PROPOFOL N/A 04/17/2016   Procedure: ESOPHAGOGASTRODUODENOSCOPY (EGD) WITH PROPOFOL;  Surgeon: Lin Landsman, MD;  Location: [facility] ENDOSCOPY;  Service: Gastroenterology;   Laterality: N/A;  . ESOPHAGOGASTRODUODENOSCOPY (EGD) WITH PROPOFOL  05/09/2016   Procedure: ESOPHAGOGASTRODUODENOSCOPY (EGD) WITH PROPOFOL;  Surgeon: Jerene Bears, MD;  Location: [place];  Service: Endoscopy;;  . ESOPHAGOGASTRODUODENOSCOPY (EGD) WITH PROPOFOL N/A 06/16/2016   Procedure: ESOPHAGOGASTRODUODENOSCOPY (EGD) WITH PROPOFOL;  Surgeon: [name], MD;  Location: [facility] ENDOSCOPY;  Service: Endoscopy;  Laterality: N/A;  . GIVENS CAPSULE STUDY N/A 05/07/2016   Procedure: GIVENS CAPSULE STUDY;  Surgeon: Doran Stabler, MD;  Location: [place];  Service: Endoscopy;  Laterality: N/A;  . MANDIBULAR HARDWARE REMOVAL N/A 07/29/2013   Procedure: REMOVAL OF ARCH BARS;  Surgeon: Theodoro Kos, DO;  Location: [place];  Service: Plastics;  Laterality: N/A;  . ORIF MANDIBULAR FRACTURE N/A 06/05/2013   Procedure: REPAIR OF MANDIBULAR FRACTURE x 2 with maxillo-mandibular fixation ;  Surgeon: Theodoro Kos, DO;  Location: [place];  Service: Plastics;  Laterality: N/A;  . PERIPHERAL ARTERIAL STENT GRAFT Left     SOCIAL HISTORY:   Social History  Substance Use Topics  . Smoking status: Current Every Day Smoker    Packs/day: 0.15    Years: 40.00    Types: Cigarettes  . Smokeless tobacco: Never Used  . Alcohol use No     Comment: pt reports quitting after learning about cirrhosis    FAMILY HISTORY:   Family History  Problem Relation Age of Onset  . Colon cancer Mother   . Cancer Father   . Cancer Sister     DRUG ALLERGIES:  No Known Allergies  REVIEW OF SYSTEMS:   Review of Systems  Constitutional: Positive for malaise/fatigue. Negative for chills, fever and weight loss.  HENT: Negative for hearing loss and nosebleeds.   Eyes: Negative for blurred vision, double vision and pain.  Respiratory: Positive for cough, shortness of breath and wheezing. Negative for hemoptysis and sputum production.   Cardiovascular: Negative for chest pain, palpitations, orthopnea and  leg swelling.  Gastrointestinal: Negative for abdominal pain, constipation, diarrhea, nausea and vomiting.  Genitourinary: Negative for dysuria and hematuria.  Musculoskeletal: Positive for falls. Negative for back pain and myalgias.  Skin: Negative for rash.  Neurological: Positive for weakness. Negative for dizziness, tremors, sensory change, speech change, focal weakness, seizures and headaches.  Endo/Heme/Allergies: Does not bruise/bleed easily.  Psychiatric/Behavioral: Negative for depression and memory loss. The patient is nervous/anxious.     MEDICATIONS AT HOME:   Prior to Admission medications   Medication Sig Start Date End Date Taking? Authorizing Provider  albuterol (PROVENTIL HFA;VENTOLIN HFA) 108 (90 Base) MCG/ACT inhaler Inhale 4-6 puffs by mouth every 4 hours as needed for wheezing, cough, and/or shortness of breath 07/11/16  Yes Hinda Kehr, MD  amLODipine (NORVASC) 5 MG tablet Take 5 mg by mouth daily.   Yes [provider]  aspirin EC 81 MG tablet Take 81 mg by mouth daily.   Yes [provider]  atorvastatin (LIPITOR) 40 MG tablet Take 40 mg by mouth daily. 04/19/16  Yes [provider]  budesonide-formoterol (SYMBICORT) 160-4.5 MCG/ACT inhaler Inhale 2 puffs into the lungs daily.   Yes [provider]  furosemide (LASIX) 80 MG tablet Take 80 mg by  mouth daily. 05/11/16  Yes [provider]  gabapentin (NEURONTIN) 300 MG capsule Take 1 capsule (300 mg total) by mouth daily. Patient taking differently: Take 300 mg by mouth at bedtime.  03/27/15  Yes Mody, Ulice Bold, MD  labetalol (NORMODYNE) 100 MG tablet Take 1 tablet (100 mg total) by mouth daily. 01/22/16  Yes Epifanio Lesches, MD  Multiple Vitamins-Minerals-FA (DIALYVITE SUPREME D) 3 MG TABS Take 1 tablet by mouth daily. 05/27/16  Yes [provider]  nitroGLYCERIN (NITROSTAT) 0.4 MG SL tablet Place 1 tablet (0.4 mg total) under the tongue every 5 (five) minutes as  needed. Patient taking differently: Place 0.4 mg under the tongue every 5 (five) minutes as needed for chest pain.  04/12/16  Yes Wende Bushy, MD  pantoprazole (PROTONIX) 40 MG tablet Take 1 tablet (40 mg total) by mouth daily. 05/10/16  Yes Ghimire, Henreitta Leber, MD  predniSONE (DELTASONE) 10 MG tablet 4 tablets daily for three days 08/12/16  Yes Wieting, Richard, MD  sevelamer carbonate (RENVELA) 800 MG tablet Take 3 tablets (2,400 mg total) by mouth 3 (three) times daily with meals. 06/01/16  Yes Theodoro Grist, MD  hydrOXYzine (VISTARIL) 25 MG capsule Take 1 capsule (25 mg total) by mouth 3 (three) times daily as needed for itching. Patient not taking: Reported on 08/16/2016 02/25/16   Hagler, Jami L, PA-C  levofloxacin (LEVAQUIN) 500 MG tablet One tab daily after dialysis q48 hours for three more doses Patient not taking: Reported on 08/16/2016 08/12/16   Loletha Grayer, MD     VITAL SIGNS:  Blood pressure (!) 95/59, pulse 96, temperature 98 F (36.7 C), temperature source Oral, resp. rate 15, height 6' (1.829 m), weight 79.4 kg (175 lb), SpO2 97 %.  PHYSICAL EXAMINATION:  Physical Exam  GENERAL:  56 y.o.-year-old patient lying in the bed with no acute distress.  EYES: Pupils equal, round, reactive to light and accommodation. No scleral icterus. Extraocular muscles intact.  HEENT: Head atraumatic, normocephalic. Oropharynx and nasopharynx clear. No oropharyngeal erythema, moist oral mucosa  NECK:  Supple, no jugular venous distention. No thyroid enlargement, no tenderness.  LUNGS: Normal breath sounds bilaterally, no wheezing, rales, rhonchi. No use of accessory muscles of respiration.  CARDIOVASCULAR: S1, S2 normal. No murmurs, rubs, or gallops.  ABDOMEN: Soft. Bowel sounds present. No organomegaly or mass. Distended EXTREMITIES: Bilateral lower extremity edema   NEUROLOGIC: Cranial nerves II through XII are intact. No focal Motor or sensory deficits appreciated b/l PSYCHIATRIC: The patient is  alert and oriented x 3. Anxious SKIN: No obvious rash, lesion, or ulcer.   LABORATORY PANEL:   CBC  Recent Labs Lab 08/16/16 0748  WBC 11.6*  HGB 6.8*  HCT 22.0*  PLT 238   ------------------------------------------------------------------------------------------------------------------  Chemistries   Recent Labs Lab 08/16/16 0748  NA 139  K 4.4  CL 99*  CO2 27  GLUCOSE 88  BUN 76*  CREATININE 6.95*  CALCIUM 8.3*  AST 58*  ALT 54  ALKPHOS 175*  BILITOT 0.9   ------------------------------------------------------------------------------------------------------------------  Cardiac Enzymes  Recent Labs Lab 08/16/16 0748  TROPONINI 0.09*   ------------------------------------------------------------------------------------------------------------------  RADIOLOGY:  Dg Chest 2 View  Result Date: 08/16/2016 CLINICAL DATA:  56 year old current smoker presenting with acute onset of shortness of breath. Current history of hypertension and coronary artery disease, post CABG in 2008. EXAM: CHEST  2 VIEW COMPARISON:  08/09/2016, 07/11/2016 and earlier, including CTA chest 03/20/2016. FINDINGS: Prior sternotomy for CABG. Cardiac silhouette moderately enlarged, unchanged dating back to 2015. Thoracic aorta atherosclerotic,  unchanged. Hilar and mediastinal contours otherwise unremarkable. Emphysematous changes in the upper lobes, prominent bronchovascular markings diffusely and moderate central peribronchial thickening, unchanged. No new pulmonary parenchymal abnormalities. Normal pulmonary vascularity. No pleural effusions. Visualized bony thorax intact. IMPRESSION: Stable cardiomegaly. COPD/emphysema. No acute cardiopulmonary disease. Electronically Signed   By: Evangeline Dakin M.D.   On: 08/16/2016 08:11     IMPRESSION AND PLAN:   * Acute on chronic anemia likely due to chronic GI losses. Patient recently had EGD/enteroscopy/colonoscopy and was found to have angiectasia  with argon treatment. We will transfuse 1 unit packed RBC stat. Admit patient onto the medical floor. Have GI see the patient. He may need repeat endoscopy. Check stool for blood.  * End-stage renal disease with significant fluid overload. Part of this is being contributed by his cirrhosis. [facility] consult nephrology for hemodialysis today as patient is due for this.  * Cirrhosis with ascites. If no improvement with end-stage renal disease he will need paracentesis.  * Hypertension. Patient's blood pressure is low normal. We will hold amlodipine and labetalol.  * DVT prophylaxis with teds.   All the records are reviewed and case discussed with ED provider. Management plans discussed with the patient, family and they are in agreement.  CODE STATUS: FULL CODE  TOTAL TIME TAKING CARE OF THIS PATIENT: 40 minutes.   Hillary Bow R M.D on 08/16/2016 at 9:49 AM  Between 7am to 6pm - Pager - [phone]  After 6pm go to [email or web address] - password [email or web address] [place] [facility]  Office  [phone]  CC: Primary care physician; [name], MD  Note: This dictation was prepared with Dragon dictation along with smaller phrase technology. Any transcriptional errors that result from this process are unintentional.

## 2016-08-16 NOTE — Progress Notes (Signed)
Okay per Dr. Darvin Neighbours  to place pt on a soft diet.

## 2016-08-17 LAB — BASIC METABOLIC PANEL
Anion gap: 14 (ref 5–15)
BUN: 51 mg/dL — AB (ref 6–20)
CHLORIDE: 97 mmol/L — AB (ref 101–111)
CO2: 27 mmol/L (ref 22–32)
CREATININE: 4.99 mg/dL — AB (ref 0.61–1.24)
Calcium: 8.1 mg/dL — ABNORMAL LOW (ref 8.9–10.3)
GFR calc Af Amer: 14 mL/min — ABNORMAL LOW (ref >60)
GFR calc non Af Amer: 12 mL/min — ABNORMAL LOW (ref >60)
GLUCOSE: 113 mg/dL — AB (ref 65–99)
POTASSIUM: 4.1 mmol/L (ref 3.5–5.1)
Sodium: 138 mmol/L (ref 135–145)

## 2016-08-17 LAB — PATHOLOGY

## 2016-08-17 LAB — CBC
HEMATOCRIT: 22.7 % — AB (ref 40.0–52.0)
Hemoglobin: 7.1 g/dL — ABNORMAL LOW (ref 13.0–18.0)
MCH: 28.4 pg (ref 26.0–34.0)
MCHC: 31.3 g/dL — AB (ref 32.0–36.0)
MCV: 90.8 fL (ref 80.0–100.0)
PLATELETS: 197 10*3/uL (ref 150–440)
RBC: 2.5 MIL/uL — ABNORMAL LOW (ref 4.40–5.90)
RDW: 20.3 % — AB (ref 11.5–14.5)
WBC: 9.6 10*3/uL (ref 3.8–10.6)

## 2016-08-17 LAB — PARATHYROID HORMONE, INTACT (NO CA): PTH: 350 pg/mL — AB (ref 15–65)

## 2016-08-17 LAB — HEMOGLOBIN: HEMOGLOBIN: 8 g/dL — AB (ref 13.0–18.0)

## 2016-08-17 MED ORDER — DIPHENHYDRAMINE HCL 50 MG/ML IJ SOLN
25.0000 mg | Freq: Once | INTRAMUSCULAR | Status: AC
  Administered 2016-08-17: 25 mg via INTRAVENOUS

## 2016-08-17 MED ORDER — HYDROCODONE-ACETAMINOPHEN 5-325 MG PO TABS
1.0000 | ORAL_TABLET | ORAL | Status: DC | PRN
  Administered 2016-08-17 (×2): 2 via ORAL
  Administered 2016-08-18: 1 via ORAL
  Administered 2016-08-18 (×3): 2 via ORAL
  Filled 2016-08-17 (×6): qty 2

## 2016-08-17 MED ORDER — SODIUM CHLORIDE 0.9 % IV SOLN: Freq: Once | INTRAVENOUS | Status: DC

## 2016-08-17 MED ORDER — NEPRO/CARBSTEADY PO LIQD
237.0000 mL | ORAL | Status: DC
  Administered 2016-08-17: 237 mL via ORAL

## 2016-08-17 NOTE — Progress Notes (Signed)
Initial Nutrition Assessment  DOCUMENTATION CODES:   Not applicable  INTERVENTION:  Provide Nepro Shake po once daily, each supplement provides 425 kcal and 19 grams protein.   Recommend renal-appropriate multivitamin with minerals (Rena-vite) QHS.  Reviewed "Low Sodium Nutrition Therapy" handout from the Academy of Nutrition and Dietetics with patient. Encouraged patient to decrease consumption of processed foods that can be high in sodium. Encouraged him to prepare fresh foods and to limit added salt during cooking. Discussed specific food items in his diet that are likely highest in sodium and recommended appropriate substitutions.   NUTRITION DIAGNOSIS:   Increased nutrient needs related to catabolic illness (ESRD on HD, cirrhosis/ascites) as evidenced by estimated needs.  GOAL:   Patient will meet greater than or equal to 90% of their needs  MONITOR:   PO intake, Supplement acceptance, Labs, Weight trends, I & O's  REASON FOR ASSESSMENT:   Malnutrition Screening Tool    ASSESSMENT:   56 year old male with PMHx of CAD s/p CABG x 3 in 2008 in [place], HTN, GERD, EtOH abuse, drug abuse, ESRD, HLD, PAD, cirrhosis admitted with acute on chronic anemia likely due to GI bleed, edema and ascites.   Spoke with patient at bedside. He reports he typically eats pretty well at home. He has been on HD for ten years now. Reports he does not follow a special diet at home. On further questioning he does understand components of renal diet, just unsure of his compliance. Reports his potassium and phosphorus are occasionally high but does endorse taking his phosphate binder with meals. For breakfast he usually has bacon, eggs, sausage, country ham, toast. Dinner is usually chicken with cornbread and milk or chicken pot pie. He follows a 32 fl oz fluid restriction (960 ml). He reports that on dialysis days he drinks a Nepro and enjoys the wild berry flavor. He is not familiar with sodium restriction  per report but is amenable to education on low sodium diet.   Patient reports his UBW was around 200 lbs and that he began losing weight over the past month with his GI bleed. He also does endorse that his weight can fluctuate with fluid. Per chart patient was 199.5 lbs on 4/17 and lost down to 173.5 lbs on 5/31 (26 lbs/13% body weight over 1.5 months). However, weight before 4/17 was lower than 199.5 lbs. Patient reports his dry weight goal was 82.5 kg (181.5 lbs) but he has been higher lately due to fluid. Will use dry weight to estimate needs. As patient was close to his dry weight on 4/24, the weight loss of 9.9 lbs (5.4% body weight) over one month is concerning as it may have loss of true body weight and is significant for time frame.  Meal Completion: 100% of dinner last night and breakfast this morning per chart (1584 kcal and 58 grams of protein between those two meals)  Medications reviewed and include: Colace, Lasix 80 mg daily, pantoprazole, prednisone 20 mg daily, Renvela 2400 mg TID with meals.   Labs reviewed: CBG 102, Chloride 97, BUN 51, Creatinine 4.99.   Nutrition-Focused physical exam completed. Findings are no fat depletion, no muscle depletion, and moderate edema in lower extremities. Abdomen distended and firm from ascites.  Patient is at risk for malnutrition due to weight loss of 5.4% over 1 month. Unsure how much of that may have been fluid. He is well-nourished on exam and is reporting good intake so will continue to monitor.   Discussed with  RN.   Diet Order:  DIET SOFT Room service appropriate? Yes; Fluid consistency: Thin  Skin:  Reviewed, no issues  Last BM:  08/16/2016  Height:   Ht Readings from Last 1 Encounters:  08/16/16 6\' 3"  (1.905 m)    Weight:   Wt Readings from Last 1 Encounters:  08/17/16 201 lb 1 oz (91.2 kg)    Ideal Body Weight:  89.1 kg  BMI:  Body mass index is 25.13 kg/m.  Estimated Nutritional Needs:   Kcal:  2260-2435 (MSJ x  1.3-1.4)  Protein:  100-115 grams (1.2-1.4 grams/kg)  Fluid:  UOP + 1 L  EDUCATION NEEDS:   Education needs addressed  Willey Blade, MS, RD, LDN Pager: [phone] After Hours Pager: [phone]

## 2016-08-17 NOTE — Progress Notes (Signed)
[facility], [place] 08/17/16  Subjective:  Hemoglobin only came up to 7.1 post blood transfusion. Hospitalist planning additional blood transfusion therefore request additional dialysis treatment today.    Objective:  Vital signs in last 24 hours:  Temp:  [98.3 F (36.8 C)-98.8 F (37.1 C)] 98.3 F (36.8 C) (06/06 0449) Pulse Rate:  [82-97] 93 (06/06 0449) Resp:  [9-22] 19 (06/06 0449) BP: (111-136)/(63-97) 128/76 (06/06 0449) SpO2:  [96 %-100 %] 99 % (06/06 0449) Weight:  [79.4 kg (175 lb 0.7 oz)-93.5 kg (206 lb 1.6 oz)] 93.5 kg (206 lb 1.6 oz) (06/05 1538)  Weight change:  Filed Weights   08/16/16 1045 08/16/16 1436 08/16/16 1538  Weight: 79.4 kg (175 lb 0.7 oz) 79.4 kg (175 lb 0.7 oz) 93.5 kg (206 lb 1.6 oz)    Intake/Output:    Intake/Output Summary (Last 24 hours) at 08/17/16 1125 Last data filed at 08/17/16 0800  Gross per 24 hour  Intake              360 ml  Output             1600 ml  Net            -1240 ml     Physical Exam: General: No acute distress, laying in the bed  HEENT Anicteric, dry oral mucous membranes  Neck supple  Pulm/lungs Normal breathing, no crackles  CVS/Heart Regular, no rub  Abdomen:  Distended, ascites  Extremities: + pitting edema bilaterally; Left > right  Neurologic: Alert, oriented. able to answer questions  Skin: No acute rashes  Access: AV fistula       Basic Metabolic Panel:   Recent Labs Lab 08/11/16 0432 08/12/16 1257 08/16/16 0748 08/16/16 1100 08/17/16 0532  NA 137 136 139  --  138  K 3.6 3.9 4.4  --  4.1  CL 95*  --  99*  --  97*  CO2 31  --  27  --  27  GLUCOSE 100* 128* 88  --  113*  BUN 30*  --  76*  --  51*  CREATININE 4.93*  --  6.95*  --  4.99*  CALCIUM 7.9*  --  8.3*  --  8.1*  PHOS  --   --   --  3.7  --      CBC:  Recent Labs Lab 08/11/16 0432 08/12/16 0513 08/12/16 1257 08/16/16 0748 08/16/16 1655 08/17/16 0532  WBC 9.0 9.0  --  11.6*  --  9.6  HGB  7.7* 7.9* 8.8* 6.8* 7.6* 7.1*  HCT 24.0* 24.7* 26.0* 22.0*  --  22.7*  MCV 84.2 85.6  --  91.7  --  90.8  PLT 297 287  --  238  --  197      Lab Results  Component Value Date   HEPBSAG Negative 08/09/2016   HEPBSAB Reactive 06/01/2016   HEPBIGM Negative 06/01/2016      Microbiology:  Recent Results (from the past 240 hour(s))  MRSA PCR Screening     Status: None   Collection Time: 08/11/16  6:00 PM  Result Value Ref Range Status   MRSA by PCR NEGATIVE NEGATIVE Final    Comment:        The GeneXpert MRSA Assay (FDA approved for NASAL specimens only), is one component of a comprehensive MRSA colonization surveillance program. It is not intended to diagnose MRSA infection nor to guide or monitor treatment for MRSA infections.  Coagulation Studies:  Recent Labs  08/16/16 0748  LABPROT 15.0  INR 1.17    Urinalysis: No results for input(s): COLORURINE, LABSPEC, PHURINE, GLUCOSEU, HGBUR, BILIRUBINUR, KETONESUR, PROTEINUR, UROBILINOGEN, NITRITE, LEUKOCYTESUR in the last 72 hours.  Invalid input(s): APPERANCEUR    Imaging: Dg Chest 2 View  Result Date: 08/16/2016 CLINICAL DATA:  56 year old current smoker presenting with acute onset of shortness of breath. Current history of hypertension and coronary artery disease, post CABG in 2008. EXAM: CHEST  2 VIEW COMPARISON:  08/09/2016, 07/11/2016 and earlier, including CTA chest 03/20/2016. FINDINGS: Prior sternotomy for CABG. Cardiac silhouette moderately enlarged, unchanged dating back to 2015. Thoracic aorta atherosclerotic, unchanged. Hilar and mediastinal contours otherwise unremarkable. Emphysematous changes in the upper lobes, prominent bronchovascular markings diffusely and moderate central peribronchial thickening, unchanged. No new pulmonary parenchymal abnormalities. Normal pulmonary vascularity. No pleural effusions. Visualized bony thorax intact. IMPRESSION: Stable cardiomegaly. COPD/emphysema. No acute  cardiopulmonary disease. Electronically Signed   By: Evangeline Dakin M.D.   On: 08/16/2016 08:11     Medications:   . sodium chloride    . sodium chloride    . sodium chloride    . sodium chloride     . docusate sodium  100 mg Oral BID  . furosemide  80 mg Oral Daily  . gabapentin  300 mg Oral QHS  . mometasone-formoterol  2 puff Inhalation BID  . pantoprazole  40 mg Oral Daily  . predniSONE  20 mg Oral Q breakfast  . sevelamer carbonate  2,400 mg Oral TID WC  . sodium chloride flush  3 mL Intravenous Q12H     Assessment/ Plan:  56 y.o. male with ESRD on hemodialysis, hypertension, peripheral vascular disease, coronary artery disease, cirrhosis, substance abuse, recurrent GI bleed, admission for SOB 07/03/16, admission for GI bleed 08/09/16 and 08/16/16.   TTS [facility] [place] Right arm AVF   1. End stage renal disease: TTS. -  Hospital's planning additional blood transfusion today. Therefore we will plan for additional dialysis treatment today. Ultrafiltration target 2-2.5 kg. We will plan for dialysis again tomorrow as well.  2. Anemia of chronic kidney disease with recent GI blood loss:  - Hemoglobin up to 7.1 post blood transfusion. Patient being administered another unit since blood transfusion response was inadequate.  3. Secondary Hyperparathyroidism:  - Continue Renvela 3 tablets by mouth 3 times a day with meals.    4.  Lower extremity edema - Additional ultrafiltration planned for today as above.   LOS: 1 Mandela Bello 6/6/201811:25 AM  [place], [place]

## 2016-08-17 NOTE — Progress Notes (Signed)
[facility] [place] NAME: Stanley [name]    MR#:  [id number]  DATE OF BIRTH:  01-Jan-1961  SUBJECTIVE:  CHIEF COMPLAINT:   Chief Complaint  Patient presents with  . Shortness of Breath   Still complains of some melena. Chronic lower extremity pain is the same.  REVIEW OF SYSTEMS:    Review of Systems  Constitutional: Positive for malaise/fatigue. Negative for chills and fever.  HENT: Negative for sore throat.   Eyes: Negative for blurred vision, double vision and pain.  Respiratory: Negative for cough, hemoptysis, shortness of breath and wheezing.   Cardiovascular: Positive for leg swelling. Negative for chest pain, palpitations and orthopnea.  Gastrointestinal: Positive for melena. Negative for abdominal pain, constipation, diarrhea, heartburn, nausea and vomiting.  Genitourinary: Negative for dysuria and hematuria.  Musculoskeletal: Positive for falls and joint pain. Negative for back pain.  Skin: Negative for rash.  Neurological: Positive for weakness. Negative for sensory change, speech change, focal weakness and headaches.  Endo/Heme/Allergies: Does not bruise/bleed easily.  Psychiatric/Behavioral: Negative for depression. The patient is not nervous/anxious.     DRUG ALLERGIES:  No Known Allergies  VITALS:  Blood pressure 129/72, pulse 88, temperature 98.3 F (36.8 C), resp. rate 10, height 6\' 3"  (1.905 m), weight 93.7 kg (206 lb 9.1 oz), SpO2 99 %.  PHYSICAL EXAMINATION:   Physical Exam  GENERAL:  56 y.o.-year-old patient lying in the bed with no acute distress.  EYES: Pupils equal, round, reactive to light and accommodation. No scleral icterus. Extraocular muscles intact.  HEENT: Head atraumatic, normocephalic. Oropharynx and nasopharynx clear.  NECK:  Supple, no jugular venous distention. No thyroid enlargement, no tenderness.  LUNGS: Normal breath sounds bilaterally, no wheezing, rales, rhonchi. No use of accessory  muscles of respiration.  CARDIOVASCULAR: S1, S2 normal. No murmurs, rubs, or gallops.  ABDOMEN: Soft, nontender. Bowel sounds present. No organomegaly or mass. Distended EXTREMITIES: No cyanosis, clubbing. Bilateral lower extremity edema NEUROLOGIC: Cranial nerves II through XII are intact. No focal Motor or sensory deficits b/l.   PSYCHIATRIC: The patient is alert and oriented x 3.  SKIN: No obvious rash, lesion, or ulcer.   LABORATORY PANEL:   CBC  Recent Labs Lab 08/17/16 0532  WBC 9.6  HGB 7.1*  HCT 22.7*  PLT 197   ------------------------------------------------------------------------------------------------------------------ Chemistries   Recent Labs Lab 08/16/16 0748 08/17/16 0532  NA 139 138  K 4.4 4.1  CL 99* 97*  CO2 27 27  GLUCOSE 88 113*  BUN 76* 51*  CREATININE 6.95* 4.99*  CALCIUM 8.3* 8.1*  AST 58*  --   ALT 54  --   ALKPHOS 175*  --   BILITOT 0.9  --    ------------------------------------------------------------------------------------------------------------------  Cardiac Enzymes  Recent Labs Lab 08/16/16 0748  TROPONINI 0.09*   ------------------------------------------------------------------------------------------------------------------  RADIOLOGY:  Dg Chest 2 View  Result Date: 08/16/2016 CLINICAL DATA:  56 year old current smoker presenting with acute onset of shortness of breath. Current history of hypertension and coronary artery disease, post CABG in 2008. EXAM: CHEST  2 VIEW COMPARISON:  08/09/2016, 07/11/2016 and earlier, including CTA chest 03/20/2016. FINDINGS: Prior sternotomy for CABG. Cardiac silhouette moderately enlarged, unchanged dating back to 2015. Thoracic aorta atherosclerotic, unchanged. Hilar and mediastinal contours otherwise unremarkable. Emphysematous changes in the upper lobes, prominent bronchovascular markings diffusely and moderate central peribronchial thickening, unchanged. No new pulmonary parenchymal  abnormalities. Normal pulmonary vascularity. No pleural effusions. Visualized bony thorax intact. IMPRESSION: Stable cardiomegaly. COPD/emphysema. No acute cardiopulmonary disease.  Electronically Signed   By: Evangeline Dakin M.D.   On: 08/16/2016 08:11     ASSESSMENT AND PLAN:   * Acute on chronic anemia likely due to chronic GI losses. Patient recently had EGD/enteroscopy/colonoscopy and was found to have angiectasia with argon treatment. Transfuse 1 unit packed RBC yesterday. Only minimal improvement in anemia. We will transfuse 1 unit today. Hemoglobin after transfusion. Seen by Dr. wall of GI. May need further procedures if any acute worsening. Orbital have outpatient follow-up at [facility].  * End-stage renal disease with significant fluid overload. Part of this is being contributed by his cirrhosis.  Discussed with Dr. Holley Raring. Hemodialysis again today  * Cirrhosis with ascites. If no improvement of ascites with hemodialysis he will need paracentesis.  * Hypertension.  Amlodipine and labetalol on hold  * DVT prophylaxis with teds.  All the records are reviewed and case discussed with Care Management/Social Workerr. Management plans discussed with the patient, family and they are in agreement.  CODE STATUS: FULL CODE  DVT Prophylaxis: SCDs  TOTAL TIME TAKING CARE OF THIS PATIENT: 35 minutes.   POSSIBLE D/C IN 1-2 DAYS, DEPENDING ON CLINICAL CONDITION.  Hillary Bow R M.D on 08/17/2016 at 1:28 PM  Between 7am to 6pm - Pager - [phone]  After 6pm go to [email or web address] - password [email or web address] [facility]  Office  [phone]  CC: Primary care physician; [name], MD  Note: This dictation was prepared with Dragon dictation along with smaller phrase technology. Any transcriptional errors that result from this process are unintentional.

## 2016-08-17 NOTE — Progress Notes (Signed)
Patient called out asking for pain medication. Upon entering patient's room he was lying in bed with his eyes closed and making a snoring sound. I called patient by his first name but he did not respond, I went around to the side of bed where patient's head was resting and loudly called his name a second time, he did not respond, he continued to snore. Pain medication was returned to pyxis and patient was left resting. Terrial Rhodes

## 2016-08-17 NOTE — Evaluation (Signed)
Physical Therapy Evaluation Patient Details Name: Stanley [name] MRN: [id number] DOB: [birth year] Today's Date: 08/17/2016   History of Present Illness  Pt is a 56 y/o M who presented with SOB and abdominal distention.  Pt was admitted for GI bleed, anemia, and fluid overload.  Pt was recently in the hospital for similar complaints along with a GI bleed and anemia and left AMA. Pt's PMH includes drug and alcohol abuse, cirrhosis, CAD, peripheral neuropathy, ESRD, suicidal ideation.    Clinical Impression  Pt admitted with above diagnosis. Mr. Habermann was Ind with ADLs, IADLs, and all aspects of mobility PTA.  He does report one fall over the past 6 months due to fatigue BLEs.  Pt was independent with all aspects of mobility today.  SpO2 reading as low as 89% on RA when ambulating but quickly up to 99% on RA once seated and resting.  Pt scored a 23/24 on the DGI (needed to use railing when ascending/descending steps) indicaticating pt is a low fall risk.  Educated pt on the importance of taking seated rest breaks at home and community when he begins to feel onset of fatigue or onset of SOB.  Pt verbalized understanding. No skilled PT needs identified. PT will sign off.     Follow Up Recommendations No PT follow up    Equipment Recommendations  None recommended by PT    Recommendations for Other Services       Precautions / Restrictions Precautions Precautions: None Restrictions Weight Bearing Restrictions: No      Mobility  Bed Mobility Overal bed mobility: Independent             General bed mobility comments: No physical assist or cues needed.  Pt performs independently and safely.  Transfers Overall transfer level: Independent Equipment used: None             General transfer comment: No physical assist or cues needed.  Pt performs independently and safely.  Ambulation/Gait Ambulation/Gait assistance: Independent Ambulation Distance (Feet): 400  Feet Assistive device: None Gait Pattern/deviations: WFL(Within Functional Limits)   Gait velocity interpretation: at or above normal speed for age/gender General Gait Details: No gait abnormalities appreciated.  Pt steady and does not demonstrate any instability.  He does require one standing rest break after ascending/descending steps due to BLE fatigue.  Pt does exhibit some wheezing toward end of ambulation.  SpO2 reading as low as 89% on RA but quickly up to 99% on RA once seated and resting.  Stairs Stairs: Yes Stairs assistance: Modified independent (Device/Increase time) Stair Management: One rail Left;Alternating pattern;Forwards Number of Stairs: 6 General stair comments: Pt steady and no signs of instability.  He does report fatigue in BLEs following.    Wheelchair Mobility    Modified Rankin (Stroke Patients Only)       Balance Overall balance assessment: Independent                               Standardized Balance Assessment Standardized Balance Assessment : Dynamic Gait Index   Dynamic Gait Index Level Surface: Normal Change in Gait Speed: Normal Gait with Horizontal Head Turns: Normal Gait with Vertical Head Turns: Normal Gait and Pivot Turn: Normal Step Over Obstacle: Normal Step Around Obstacles: Normal Steps: Mild Impairment (uses rail) Total Score: 23       Pertinent Vitals/Pain Pain Assessment: 0-10 Pain Score: 8  Pain Location: LLE/foot (due to swelling) Pain Descriptors /  Indicators: Tightness;Aching Pain Intervention(s): Limited activity within patient's tolerance;Monitored during session;Repositioned    Home Living Family/patient expects to be discharged to:: Private residence Living Arrangements: Alone Available Help at Discharge: Friend(s);Available PRN/intermittently Type of Home: House Home Access: Stairs to enter Entrance Stairs-Rails: Left Entrance Stairs-Number of Steps: 2 Home Layout: One level Home Equipment:  None      Prior Function Level of Independence: Independent         Comments: Pt does not drive but is independent with all ADLs, IADLs, and all aspects of mobility.  He does report 1 fall in the past 6 months when his legs "got weak" and he fell.  Reports his legs fatigue at times causing him to become unsteady.     Hand Dominance   Dominant Hand: Right    Extremity/Trunk Assessment   Upper Extremity Assessment Upper Extremity Assessment: Overall WFL for tasks assessed    Lower Extremity Assessment Lower Extremity Assessment: LLE deficits/detail;RLE deficits/detail RLE Deficits / Details: Edematous but strength WNL LLE Deficits / Details: Edematous but strength WNL    Cervical / Trunk Assessment Cervical / Trunk Assessment: Normal  Communication   Communication: No difficulties  Cognition Arousal/Alertness: Awake/alert Behavior During Therapy: WFL for tasks assessed/performed Overall Cognitive Status: Within Functional Limits for tasks assessed                                        General Comments General comments (skin integrity, edema, etc.): Pt scored a 23/24 on the DGI (needed to use railing when ascending/descending steps) indicaticating pt is a low fall risk.      Exercises General Exercises - Lower Extremity Ankle Circles/Pumps: AROM;Both;15 reps;Seated;Supine Other Exercises Other Exercises: Encouraged pt to ambualte with nursing staff at least 3x/day. Other Exercises: Educated pt on the importance of taking seated rest breaks in home or in community when he begins to feel onset of fatigue or SOB.  Pt verbalized understanding.   Assessment/Plan    PT Assessment Patent does not need any further PT services  PT Problem List         PT Treatment Interventions      PT Goals (Current goals can be found in the Care Plan section)  Acute Rehab PT Goals Patient Stated Goal: to go home PT Goal Formulation: All assessment and education  complete, DC therapy    Frequency     Barriers to discharge        Co-evaluation               AM-PAC PT "6 Clicks" Daily Activity  Outcome Measure Difficulty turning over in bed (including adjusting bedclothes, sheets and blankets)?: None Difficulty moving from lying on back to sitting on the side of the bed? : None Difficulty sitting down on and standing up from a chair with arms (e.g., wheelchair, bedside commode, etc,.)?: None Help needed moving to and from a bed to chair (including a wheelchair)?: None Help needed walking in hospital room?: None Help needed climbing 3-5 steps with a railing? : None 6 Click Score: 24    End of Session Equipment Utilized During Treatment: Gait belt Activity Tolerance: Patient tolerated treatment well Patient left: in chair;with call bell/phone within reach Nurse Communication: Mobility status;Other (comment) (SpO2) PT Visit Diagnosis: Pain;Unsteadiness on feet (R26.81) Pain - Right/Left: Left Pain - part of body: Leg    Time: 8280-0349 PT Time Calculation (min) (  ACUTE ONLY): 17 min   Charges:   PT Evaluation $PT Eval Low Complexity: 1 Procedure     PT G Codes:        Collie Siad PT, DPT 08/17/2016, 9:11 AM

## 2016-08-18 ENCOUNTER — Encounter: Payer: Self-pay | Admitting: Gastroenterology

## 2016-08-18 ENCOUNTER — Inpatient Hospital Stay: Payer: Medicare Other

## 2016-08-18 LAB — PHOSPHORUS: Phosphorus: 3.6 mg/dL (ref 2.5–4.6)

## 2016-08-18 LAB — HEMOGLOBIN: HEMOGLOBIN: 7.8 g/dL — AB (ref 13.0–18.0)

## 2016-08-18 MED ORDER — ALBUMIN HUMAN 25 % IV SOLN
25.0000 g | Freq: Once | INTRAVENOUS | Status: AC
  Administered 2016-08-18: 25 g via INTRAVENOUS
  Filled 2016-08-18: qty 100

## 2016-08-18 MED ORDER — DIPHENHYDRAMINE HCL 50 MG/ML IJ SOLN
25.0000 mg | Freq: Once | INTRAMUSCULAR | Status: AC
  Administered 2016-08-18: 25 mg via INTRAVENOUS

## 2016-08-18 NOTE — Progress Notes (Signed)
HD STARTED  

## 2016-08-18 NOTE — Progress Notes (Signed)
[facility] NAME: Stanley [name]    MR#:  [id number]  DATE OF BIRTH:  1960/04/20  SUBJECTIVE:  CHIEF COMPLAINT:   Chief Complaint  Patient presents with  . Shortness of Breath   Continues to have melena. Abdominal pain. Melena going on for 2 months now. No blood in stool. Diffuse abdominal pain which is chronic is to same.  REVIEW OF SYSTEMS:    Review of Systems  Constitutional: Positive for malaise/fatigue. Negative for chills and fever.  HENT: Negative for sore throat.   Eyes: Negative for blurred vision, double vision and pain.  Respiratory: Negative for cough, hemoptysis, shortness of breath and wheezing.   Cardiovascular: Positive for leg swelling. Negative for chest pain, palpitations and orthopnea.  Gastrointestinal: Positive for melena. Negative for abdominal pain, constipation, diarrhea, heartburn, nausea and vomiting.  Genitourinary: Negative for dysuria and hematuria.  Musculoskeletal: Positive for falls and joint pain. Negative for back pain.  Skin: Negative for rash.  Neurological: Positive for weakness. Negative for sensory change, speech change, focal weakness and headaches.  Endo/Heme/Allergies: Does not bruise/bleed easily.  Psychiatric/Behavioral: Negative for depression. The patient is not nervous/anxious.     DRUG ALLERGIES:  No Known Allergies  VITALS:  Blood pressure 119/62, pulse 89, temperature 98.3 F (36.8 C), temperature source Oral, resp. rate 11, height 6\' 3"  (1.905 m), weight 94.6 kg (208 lb 8.9 oz), SpO2 100 %.  PHYSICAL EXAMINATION:   Physical Exam  GENERAL:  56 y.o.-year-old patient lying in the bed with no acute distress.  EYES: Pupils equal, round, reactive to light and accommodation. No scleral icterus. Extraocular muscles intact.  HEENT: Head atraumatic, normocephalic. Oropharynx and nasopharynx clear.  NECK:  Supple, no jugular venous distention. No thyroid enlargement, no  tenderness.  LUNGS: Normal breath sounds bilaterally, no wheezing, rales, rhonchi. No use of accessory muscles of respiration.  CARDIOVASCULAR: S1, S2 normal. No murmurs, rubs, or gallops.  ABDOMEN: Soft, nontender. Bowel sounds present. No organomegaly or mass. Distended EXTREMITIES: No cyanosis, clubbing. Bilateral lower extremity edema NEUROLOGIC: Cranial nerves II through XII are intact. No focal Motor or sensory deficits b/l.   PSYCHIATRIC: The patient is alert and oriented x 3.  SKIN: No obvious rash, lesion, or ulcer.   LABORATORY PANEL:   CBC  Recent Labs Lab 08/17/16 0532  08/18/16 0519  WBC 9.6  --   --   HGB 7.1*  < > 7.8*  HCT 22.7*  --   --   PLT 197  --   --   < > = values in this interval not displayed. ------------------------------------------------------------------------------------------------------------------ Chemistries   Recent Labs Lab 08/16/16 0748 08/17/16 0532  NA 139 138  K 4.4 4.1  CL 99* 97*  CO2 27 27  GLUCOSE 88 113*  BUN 76* 51*  CREATININE 6.95* 4.99*  CALCIUM 8.3* 8.1*  AST 58*  --   ALT 54  --   ALKPHOS 175*  --   BILITOT 0.9  --    ------------------------------------------------------------------------------------------------------------------  Cardiac Enzymes  Recent Labs Lab 08/16/16 0748  TROPONINI 0.09*   ------------------------------------------------------------------------------------------------------------------  RADIOLOGY:  No results found.   ASSESSMENT AND PLAN:   * Acute on chronic anemia likely due to chronic GI losses. Patient recently had EGD/enteroscopy/colonoscopy and was found to have angiectasia with argon treatment. Had 2 units packed RBC transfusion this admission Seen by Dr. Allen Norris of GI. May need further procedures if any acute worsening. outpatient [facility] at [facility].  *  End-stage renal disease with significant fluid overload. Part of this is being contributed by his cirrhosis.   Discussed with Dr. Holley Raring. Hemodialysis today  * Cirrhosis with ascites. We'll request radiology for ultrasound guided therapeutic paracentesis as patient is short of breath and has abdominal pain from distention.  * Hypertension.  Amlodipine and labetalol on hold  * DVT prophylaxis with teds.  All the records are reviewed and case discussed with Care Management/Social Workerr. Management plans discussed with the patient, family and they are in agreement.  CODE STATUS: FULL CODE  DVT Prophylaxis: SCDs  TOTAL TIME TAKING CARE OF THIS PATIENT: 35 minutes.   POSSIBLE D/C IN 1-2 DAYS, DEPENDING ON CLINICAL CONDITION.  Hillary Bow R M.D on 08/18/2016 at 11:25 AM  Between 7am to 6pm - Pager - [phone]  After 6pm go to [email or web address] - password [email or web address] [facility]  Office  [phone]  CC: Primary care physician; [name], MD  Note: This dictation was prepared with Dragon dictation along with smaller phrase technology. Any transcriptional errors that result from this process are unintentional.

## 2016-08-18 NOTE — Progress Notes (Signed)
[facility], [place] 08/18/16  Subjective:  Patient seen and evaluated during hemodialysis. Appears to be tolerating well. Patient also due for paracentesis later today.    Objective:  Vital signs in last 24 hours:  Temp:  [97 F (36.1 C)-98.9 F (37.2 C)] 98.2 F (36.8 C) (06/07 0425) Pulse Rate:  [79-93] 79 (06/07 0725) Resp:  [9-20] 18 (06/07 0725) BP: (124-152)/(69-88) 130/73 (06/07 0725) SpO2:  [98 %-100 %] 100 % (06/07 0725) Weight:  [91.2 kg (201 lb 1 oz)-93.8 kg (206 lb 11.2 oz)] 93.7 kg (206 lb 9.6 oz) (06/07 0725)  Weight change: 14.3 kg (31 lb 9.1 oz) Filed Weights   08/17/16 1530 08/18/16 0500 08/18/16 0725  Weight: 91.2 kg (201 lb 1 oz) 93.8 kg (206 lb 11.2 oz) 93.7 kg (206 lb 9.6 oz)    Intake/Output:    Intake/Output Summary (Last 24 hours) at 08/18/16 0956 Last data filed at 08/18/16 1607  Gross per 24 hour  Intake              770 ml  Output             2210 ml  Net            -1440 ml     Physical Exam: General: No acute distress, laying in the bed  HEENT Anicteric, moist oral mucous membranes  Neck supple  Pulm/lungs Normal breathing, no crackles  CVS/Heart Regular, no rub  Abdomen:  Distended, ascites  Extremities: Decreased LE edema  Neurologic: Alert, oriented. able to answer questions  Skin: No acute rashes  Access: AV fistula       Basic Metabolic Panel:   Recent Labs Lab 08/12/16 1257 08/16/16 0748 08/16/16 1100 08/17/16 0532  NA 136 139  --  138  K 3.9 4.4  --  4.1  CL  --  99*  --  97*  CO2  --  27  --  27  GLUCOSE 128* 88  --  113*  BUN  --  76*  --  51*  CREATININE  --  6.95*  --  4.99*  CALCIUM  --  8.3*  --  8.1*  PHOS  --   --  3.7  --      CBC:  Recent Labs Lab 08/12/16 0513 08/12/16 1257 08/16/16 0748 08/16/16 1655 08/17/16 0532 08/17/16 1636 08/18/16 0519  WBC 9.0  --  11.6*  --  9.6  --   --   HGB 7.9* 8.8* 6.8* 7.6* 7.1* 8.0* 7.8*  HCT 24.7* 26.0* 22.0*  --  22.7*   --   --   MCV 85.6  --  91.7  --  90.8  --   --   PLT 287  --  238  --  197  --   --       Lab Results  Component Value Date   HEPBSAG Negative 08/09/2016   HEPBSAB Reactive 06/01/2016   HEPBIGM Negative 06/01/2016      Microbiology:  Recent Results (from the past 240 hour(s))  MRSA PCR Screening     Status: None   Collection Time: 08/11/16  6:00 PM  Result Value Ref Range Status   MRSA by PCR NEGATIVE NEGATIVE Final    Comment:        The GeneXpert MRSA Assay (FDA approved for NASAL specimens only), is one component of a comprehensive MRSA colonization surveillance program. It is not intended to diagnose MRSA infection nor to guide or  monitor treatment for MRSA infections.     Coagulation Studies:  Recent Labs  08/16/16 0748  LABPROT 15.0  INR 1.17    Urinalysis: No results for input(s): COLORURINE, LABSPEC, PHURINE, GLUCOSEU, HGBUR, BILIRUBINUR, KETONESUR, PROTEINUR, UROBILINOGEN, NITRITE, LEUKOCYTESUR in the last 72 hours.  Invalid input(s): APPERANCEUR    Imaging: No results found.   Medications:   . sodium chloride    . sodium chloride    . sodium chloride    . sodium chloride     . docusate sodium  100 mg Oral BID  . feeding supplement (NEPRO CARB STEADY)  237 mL Oral Q24H  . furosemide  80 mg Oral Daily  . gabapentin  300 mg Oral QHS  . mometasone-formoterol  2 puff Inhalation BID  . pantoprazole  40 mg Oral Daily  . predniSONE  20 mg Oral Q breakfast  . sevelamer carbonate  2,400 mg Oral TID WC  . sodium chloride flush  3 mL Intravenous Q12H     Assessment/ Plan:  56 y.o. male with ESRD on hemodialysis, hypertension, peripheral vascular disease, coronary artery disease, cirrhosis, substance abuse, recurrent GI bleed, admission for SOB 07/03/16, admission for GI bleed 08/09/16 and 08/16/16.   TTS [facility] Right arm AVF   1. End stage renal disease: TTS. -  Patient seen and evaluated during hemodialysis. Patient appears to  be tolerating well. Since the patient will be receiving paracentesis today we will go ahead and administer the patient albumin 25 g IV 1.  2. Anemia of chronic kidney disease with recent GI blood loss:  - Hemoglobin up to 7.8 posttransfusion. Continue to monitor CBC.  3. Secondary Hyperparathyroidism:  - Phosphorous 3.7 and at target. Continue current doses of Renvela.  4.  Lower extremity edema - Overall improved with 3 days of hemodialysis. We will continue to monitor this clinically.   LOS: 2 Stanley [name], [facility] 6/7/20189:56 AM  [address] [place], [place]

## 2016-08-18 NOTE — Progress Notes (Signed)
HD COMPLETED  

## 2016-08-18 NOTE — Progress Notes (Signed)
PRE DIALYSIS ASSESSMENT 

## 2016-08-18 NOTE — Progress Notes (Signed)
POST DIALYSIS ASSESSMENT 

## 2016-08-18 NOTE — Progress Notes (Signed)
Pt left AMA/ removed his own IV/ RN and MD tried to encourage pt to stay / pt agitated and refusing to stay/ AMA form signed by pt and pt walked off unit

## 2016-08-18 NOTE — Care Management (Signed)
Patient left AMA.  Stanley [name] HD liaison notifed

## 2016-08-19 LAB — PREPARE RBC (CROSSMATCH)

## 2016-08-19 NOTE — Discharge Summary (Signed)
[facility] [place] NAME: Stanley [name]    MR#:  [id number]  DATE OF BIRTH:  08/31/1960  DATE OF ADMISSION:  08/16/2016 ADMITTING PHYSICIAN: Hillary Bow, MD  DATE OF DISCHARGE: 08/18/2016  3:11 PM  PRIMARY CARE PHYSICIAN: [name], MD   ADMISSION DIAGNOSIS:  Shortness of breath [R06.02] Gastrointestinal hemorrhage, unspecified gastrointestinal hemorrhage type [K92.2] Ascites due to alcoholic cirrhosis ([place]) [N82.95]  DISCHARGE DIAGNOSIS:  Active Problems:   Anemia   SECONDARY DIAGNOSIS:   Past Medical History:  Diagnosis Date  . Alcohol abuse   . Cirrhosis ([place])   . Coronary artery disease 2009  . Diabetic peripheral neuropathy ([place])   . Drug abuse   . End stage renal disease on dialysis [facility]) NEPHROLOGIST-   DR [facility]  IN [place]   HEMODIALYSIS --   TUES/  THURS/  SAT  . GERD (gastroesophageal reflux disease)   . Hyperlipidemia   . Hypertension   . PAD (peripheral artery disease) ([place])   . Renal insufficiency    Per pt, 32 oz fluid restriction per day  . S/P triple vessel bypass 06/09/2016   2009ish  . Suicidal ideation    & HOMICIDAL IDEATION --  06-16-2013   ADMITTED TO BEHAVIOR HEALTH     ADMITTING HISTORY  HISTORY OF PRESENT ILLNESS:  Stanley [name]  is a 56 y.o. male with a known history of Alcohol abuse, hypertension, cirrhosis, CAD, end-stage renal disease on hemodialysis Tuesday to Thursday and Saturday presents to the emergency room complaining of shortness of breath and abdominal distention. Patient was recently in the hospital for similar complaints along with a GI bleed and anemia. He had EGD/enteroscopy and colonoscopy with angioectasia of which were treated with argon. Patient left AGAINST MEDICAL ADVICE to attend a graduation ceremony. Since leaving the hospital he has not done well. Continues to have wheezing. Recurrent falls and difficult to walk due to swelling in his legs. He feels he  needs repeat paracentesis which was done approximately a month back.  Today he has been found to have worsening anemia. Patient is due for hemodialysis today.  Being admitted for GI bleed, anemia and fluid overload.   HOSPITAL COURSE:   * Acute on chronic anemia likely due to chronic GI losses. Patient recently had EGD/enteroscopy/colonoscopy and was found to have angiectasia with argon treatment. Had 2 units packed RBC transfusion this admission Seen by Dr. Allen Norris of GI. Patient had his hemoglobin improved with blood transfusion but this started slowly trending down. Plan was to monitor hemoglobin and if there is any worsening transfer patient to [facility] or [facility].  * End-stage renal disease with significant fluid overload. Part of this is being contributed by his cirrhosis.  Discussed with Dr. Holley Raring. Patient had 3 rounds of hemodialysis during the hospital stay. Counseled patient to limit fluid intake.  * Cirrhosis with ascites. Ultrasound paracentesis of 1.8 L of clear fluid.  * Hypertension.  Amlodipine and labetalol on hold  * DVT prophylaxis with teds In the hospital  Patient left AGAINST MEDICAL ADVICE on 08/18/2016. I've advised him to return to the emergency room if there is any worsening. He will need transfer to [facility] or [facility] as GI here is unable to provide any further help.  CONSULTS OBTAINED:  Treatment Team:  [name], MD  DRUG ALLERGIES:  No Known Allergies  DISCHARGE MEDICATIONS:   Discharge Medication List as of 08/18/2016  3:11 PM    CONTINUE these  medications which have NOT CHANGED   Details  albuterol (PROVENTIL HFA;VENTOLIN HFA) 108 (90 Base) MCG/ACT inhaler Inhale 4-6 puffs by mouth every 4 hours as needed for wheezing, cough, and/or shortness of breath, Print    amLODipine (NORVASC) 5 MG tablet Take 5 mg by mouth daily., Historical Med    aspirin EC 81 MG tablet Take 81 mg by mouth daily., Historical Med    atorvastatin (LIPITOR) 40  MG tablet Take 40 mg by mouth daily., Starting Tue 04/19/2016, Historical Med    budesonide-formoterol (SYMBICORT) 160-4.5 MCG/ACT inhaler Inhale 2 puffs into the lungs daily., Historical Med    furosemide (LASIX) 80 MG tablet Take 80 mg by mouth daily., Starting Wed 05/11/2016, Historical Med    gabapentin (NEURONTIN) 300 MG capsule Take 1 capsule (300 mg total) by mouth daily., Starting Fri 03/27/2015, Normal    labetalol (NORMODYNE) 100 MG tablet Take 1 tablet (100 mg total) by mouth daily., Starting Fri 01/22/2016, Normal    Multiple Vitamins-Minerals-FA (DIALYVITE SUPREME D) 3 MG TABS Take 1 tablet by mouth daily., Starting Fri 05/27/2016, Historical Med    nitroGLYCERIN (NITROSTAT) 0.4 MG SL tablet Place 1 tablet (0.4 mg total) under the tongue every 5 (five) minutes as needed., Starting Tue 04/12/2016, Normal    pantoprazole (PROTONIX) 40 MG tablet Take 1 tablet (40 mg total) by mouth daily., Starting Tue 05/10/2016, Print    predniSONE (DELTASONE) 10 MG tablet 4 tablets daily for three days, Print    sevelamer carbonate (RENVELA) 800 MG tablet Take 3 tablets (2,400 mg total) by mouth 3 (three) times daily with meals., Starting Wed 06/01/2016, Normal    hydrOXYzine (VISTARIL) 25 MG capsule Take 1 capsule (25 mg total) by mouth 3 (three) times daily as needed for itching., Starting Thu 02/25/2016, Print    levofloxacin (LEVAQUIN) 500 MG tablet One tab daily after dialysis q48 hours for three more doses, Print        Today   VITAL SIGNS:  Blood pressure (!) 141/89, pulse 93, temperature 98.3 F (36.8 C), temperature source Oral, resp. rate 18, height 6\' 3"  (1.905 m), weight 93.1 kg (205 lb 4 oz), SpO2 99 %.  I/O:  No intake or output data in the 24 hours ending 08/19/16 1346  PHYSICAL EXAMINATION:  Physical Exam  GENERAL:  56 y.o.-year-old patient lying in the bed with no acute distress.  LUNGS: Normal breath sounds bilaterally, no wheezing, rales,rhonchi or crepitation. No use  of accessory muscles of respiration.  CARDIOVASCULAR: S1, S2 normal. No murmurs, rubs, or gallops.  ABDOMEN: Soft, non-tender, non-distended. Bowel sounds present. No organomegaly or mass.  NEUROLOGIC: Moves all 4 extremities. PSYCHIATRIC: The patient is alert and oriented x 3.  SKIN: No obvious rash, lesion, or ulcer.   DATA REVIEW:   CBC  Recent Labs Lab 08/17/16 0532  08/18/16 0519  WBC 9.6  --   --   HGB 7.1*  < > 7.8*  HCT 22.7*  --   --   PLT 197  --   --   < > = values in this interval not displayed.  Chemistries   Recent Labs Lab 08/16/16 0748 08/17/16 0532  NA 139 138  K 4.4 4.1  CL 99* 97*  CO2 27 27  GLUCOSE 88 113*  BUN 76* 51*  CREATININE 6.95* 4.99*  CALCIUM 8.3* 8.1*  AST 58*  --   ALT 54  --   ALKPHOS 175*  --   BILITOT 0.9  --  Cardiac Enzymes  Recent Labs Lab 08/16/16 0748  TROPONINI 0.09*    Microbiology Results  Results for orders placed or performed during the hospital encounter of 08/09/16  MRSA PCR Screening     Status: None   Collection Time: 08/11/16  6:00 PM  Result Value Ref Range Status   MRSA by PCR NEGATIVE NEGATIVE Final    Comment:        The GeneXpert MRSA Assay (FDA approved for NASAL specimens only), is one component of a comprehensive MRSA colonization surveillance program. It is not intended to diagnose MRSA infection nor to guide or monitor treatment for MRSA infections.     RADIOLOGY:  US Paracentesis  Result Date: 08/18/2016 INDICATION: Ascites EXAM: ULTRASOUND GUIDED  PARACENTESIS MEDICATIONS: None. COMPLICATIONS: None immediate. PROCEDURE: Informed written consent was obtained from the patient after a discussion of the risks, benefits and alternatives to treatment. A timeout was performed prior to the initiation of the procedure. Initial ultrasound scanning demonstrates a small amount of ascites within the left lower abdominal quadrant. The right lower abdomen was prepped and draped in the usual sterile  fashion. 1% lidocaine with epinephrine was used for local anesthesia. Following this, a Safe-T-Centesis catheter was introduced. An ultrasound image was saved for documentation purposes. The paracentesis was performed. The catheter was removed and a dressing was applied. The patient tolerated the procedure well without immediate post procedural complication. FINDINGS: A total of approximately 1.8 L of clear yellow fluid was removed. IMPRESSION: Successful ultrasound-guided paracentesis yielding 1.8 liters of peritoneal fluid. Electronically Signed   By: Marybelle Killings M.D.   On: 08/18/2016 15:37    Follow up with PCP in 1 week.  Management plans discussed with the patient, family and they are in agreement.  CODE STATUS:  Code Status History    Date Active Date Inactive Code Status Order ID Comments User Context   08/16/2016  9:45 AM 08/18/2016  6:16 PM Full Code [id number]  Hillary Bow, MD ED   08/09/2016  4:59 PM 08/12/2016  8:34 PM Full Code [id number]  Fritzi Mandes, MD Inpatient   07/03/2016  9:54 PM 07/05/2016  9:22 PM Full Code [id number]  Demetrios Loll, MD Inpatient   06/24/2016  4:23 AM 06/26/2016  3:46 PM Full Code [id number]  Saundra Shelling, MD Inpatient   06/14/2016  2:53 PM 06/19/2016  9:04 PM Full Code [id number]  Hillary Bow, MD ED   05/31/2016  6:42 AM 06/01/2016  9:30 PM Full Code [id number]  Saundra Shelling, MD Inpatient   05/05/2016 11:09 PM 05/10/2016  9:57 PM Full Code [id number]  Vianne Bulls, MD Inpatient   04/16/2016  3:56 AM 04/17/2016  6:27 PM Full Code [id number]  Saundra Shelling, MD Inpatient   01/19/2016  3:13 PM 01/22/2016  2:14 PM Full Code [id number]  Loletha Grayer, MD ED   09/24/2015 10:33 AM 09/25/2015  1:31 PM Full Code [id number]  Loletha Grayer, MD ED   05/16/2015  2:17 PM 05/17/2015  8:57 PM Full Code [id number]  Idelle Crouch, MD Inpatient   04/30/2015  8:08 PM 05/08/2015  7:40 PM Full Code [id number]  Vaughan Basta, MD Inpatient   04/06/2015  5:07 AM 04/07/2015  5:36 PM Full Code  [id number]  Lance Coon, MD Inpatient   03/23/2015 11:07 AM 03/27/2015 12:46 PM Full Code [id number]  Bettey Costa, MD Inpatient   06/19/2013 11:32 AM 06/21/2013  5:49 PM Full Code [id number]  Cristal Ford, DO Inpatient   06/16/2013  5:26 PM 06/19/2013 11:32 AM Full  Code [id number]  Margarita Mail, PA-C ED   06/05/2013  3:33 PM 06/06/2013  6:56 PM Full Code [id number]  Theodoro Kos, DO Inpatient   06/02/2013  9:01 AM 06/05/2013  3:33 PM Full Code [id number]  Velvet Bathe, MD Inpatient   06/02/2013  8:40 AM 06/02/2013  9:01 AM Full Code [id number]  Velvet Bathe, MD Inpatient      TOTAL TIME TAKING CARE OF THIS PATIENT ON DAY OF DISCHARGE: more than 30 minutes.   Hillary Bow R M.D on 08/19/2016 at 1:46 PM  Between 7am to 6pm - Pager - [phone]  After 6pm go to [email or web address] - password [email or web address] [facility]  Office  [phone]  CC: Primary care physician; [name], MD  Note: This dictation was prepared with Dragon dictation along with smaller phrase technology. Any transcriptional errors that result from this process are unintentional.

## 2016-08-20 DIAGNOSIS — D649 Anemia, unspecified: Secondary | ICD-10-CM | POA: Diagnosis not present

## 2016-08-20 DIAGNOSIS — N186 End stage renal disease: Secondary | ICD-10-CM | POA: Diagnosis not present

## 2016-08-20 LAB — TYPE AND SCREEN
ABO/RH(D): A POS
ANTIBODY SCREEN: NEGATIVE
UNIT DIVISION: 0
UNIT DIVISION: 0
Unit division: 0
Unit division: 0
Unit division: 0

## 2016-08-20 LAB — BPAM RBC
BLOOD PRODUCT EXPIRATION DATE: 201806082359
BLOOD PRODUCT EXPIRATION DATE: 201806292359
Blood Product Expiration Date: 201806062359
Blood Product Expiration Date: 201806082359
Blood Product Expiration Date: 201806202359
ISSUE DATE / TIME: 201806051243
ISSUE DATE / TIME: 201806071304
ISSUE DATE / TIME: 201806061304
UNIT TYPE AND RH: 6200
UNIT TYPE AND RH: 6200
Unit Type and Rh: 6200
Unit Type and Rh: 6200
Unit Type and Rh: 6200

## 2016-08-20 NOTE — Discharge Summary (Signed)
[facility] [place] NAME: Lyric Hoar    MR#:  [id number]  DATE OF BIRTH:  [birth year]  DATE OF ADMISSION:  08/09/2016 ADMITTING PHYSICIAN: Fritzi Mandes, MD  DATE OF signing out [place] : 08/12/2016  5:28 PM  PRIMARY CARE PHYSICIAN: [name], MD    ADMISSION DIAGNOSIS:  Hyperkalemia [E87.5] Gastrointestinal hemorrhage, unspecified gastrointestinal hemorrhage type [K92.2] Anemia, unspecified type [D64.9]  DISCHARGE DIAGNOSIS:  Active Problems:   Hyperkalemia   Anemia   Heme positive stool   Ulceration of intestine   Benign neoplasm of transverse colon   Acute gastrointestinal hemorrhage   Esophageal candidiasis (HCC)   Angiodysplasia of intestinal tract   SECONDARY DIAGNOSIS:   Past Medical History:  Diagnosis Date  . Alcohol abuse   . Cirrhosis ([place])   . Coronary artery disease 2009  . Diabetic peripheral neuropathy ([place])   . Drug abuse   . End stage renal disease on dialysis [facility]) NEPHROLOGIST-   DR [facility]  IN [place]   HEMODIALYSIS --   TUES/  THURS/  SAT  . GERD (gastroesophageal reflux disease)   . Hyperlipidemia   . Hypertension   . PAD (peripheral artery disease) ([place])   . Renal insufficiency    Per pt, 32 oz fluid restriction per day  . S/P triple vessel bypass 06/09/2016   2009ish  . Suicidal ideation    & HOMICIDAL IDEATION --  06-16-2013   ADMITTED TO [place] COURSE:   1. Acute on chronic blood loss anemia. Hemoglobin on admission 5.3. Patient received 4 units of blood during the hospital course. Gastroenterology did a push enteroscopy and colonoscopy and cauterized AVMs on the day of signing out [place]. I advised him that I wanted to give another dose of IV iron tomorrow and check hemoglobin tomorrow. Patient must stop aspirin and other blood thinners. 2. Acute hyperkalemia secondary to missing dialysis. Patient received dialysis here in the  hospital and hyperkalemia improved. 3. Cellulitis left lower extremity and COPD exacerbation. Patient was started on empiric Rocephin and Solu-Medrol. I did prescribe oral prednisone taper and Levaquin upon discharge home for a few days. 4. Essential hypertension on Norvasc and labetalol 5. Hyperlipidemia unspecified on atorvastatin 6. History of coronary artery disease and PAD. Unable to give blood thinners secondary to chronic bleeding 7. GERD on Protonix 8. End-stage renal disease on dialysis 9. History of cirrhosis with ascites  DISCHARGE CONDITIONS:   Patient signed out [place]  CONSULTS OBTAINED:  Treatment Team:  Murlean Iba, MD [name], MD  DRUG ALLERGIES:  No Known Allergies  DISCHARGE MEDICATIONS:   Discharge Medication List as of 08/12/2016  5:29 PM    START taking these medications   Details  levofloxacin (LEVAQUIN) 500 MG tablet One tab daily after dialysis q48 hours for three more doses, Print      CONTINUE these medications which have CHANGED   Details  predniSONE (DELTASONE) 10 MG tablet 4 tablets daily for three days, Print      CONTINUE these medications which have NOT CHANGED   Details  atorvastatin (LIPITOR) 40 MG tablet Take 40 mg by mouth daily., Starting Tue 04/19/2016, Historical Med    budesonide-formoterol (SYMBICORT) 160-4.5 MCG/ACT inhaler Inhale 2 puffs into the lungs daily., Historical Med    furosemide (LASIX) 80 MG tablet Take 80 mg by mouth daily., Starting Wed 05/11/2016, Historical Med    gabapentin (NEURONTIN) 300 MG capsule  Take 1 capsule (300 mg total) by mouth daily., Starting Fri 03/27/2015, Normal    labetalol (NORMODYNE) 100 MG tablet Take 1 tablet (100 mg total) by mouth daily., Starting Fri 01/22/2016, Normal    Multiple Vitamins-Minerals-FA (DIALYVITE SUPREME D) 3 MG TABS Take 1 tablet by mouth daily., Starting Fri 05/27/2016, Historical Med    pantoprazole (PROTONIX) 40 MG tablet Take 1 tablet (40 mg total)  by mouth daily., Starting Tue 05/10/2016, Print    sevelamer carbonate (RENVELA) 800 MG tablet Take 3 tablets (2,400 mg total) by mouth 3 (three) times daily with meals., Starting Wed 06/01/2016, Normal    albuterol (PROVENTIL HFA;VENTOLIN HFA) 108 (90 Base) MCG/ACT inhaler Inhale 4-6 puffs by mouth every 4 hours as needed for wheezing, cough, and/or shortness of breath, Print    amLODipine (NORVASC) 5 MG tablet Take 5 mg by mouth daily., Historical Med    hydrOXYzine (VISTARIL) 25 MG capsule Take 1 capsule (25 mg total) by mouth 3 (three) times daily as needed for itching., Starting Thu 02/25/2016, Print    nitroGLYCERIN (NITROSTAT) 0.4 MG SL tablet Place 1 tablet (0.4 mg total) under the tongue every 5 (five) minutes as needed., Starting Tue 04/12/2016, Normal         DISCHARGE INSTRUCTIONS:   Patient signed out AMA  Today   CHIEF COMPLAINT:   Chief Complaint  Patient presents with  . Spasms    HISTORY OF PRESENT ILLNESS:  Elester Apodaca  is a 56 y.o. male found to have a low hemoglobin   VITAL SIGNS:  Blood pressure 106/66, pulse 79, temperature 97.1 F (36.2 C), temperature source Tympanic, resp. rate 18, height 6\' 3"  (1.905 m), weight 78.7 kg (173 lb 8 oz), SpO2 97 %.    CODE STATUS:  Code Status History    Date Active Date Inactive Code Status Order ID Comments User Context   08/16/2016  9:45 AM 08/18/2016  6:16 PM Full Code [id number]  Hillary Bow, MD ED   08/09/2016  4:59 PM 08/12/2016  8:34 PM Full Code [id number]  Fritzi Mandes, MD Inpatient   07/03/2016  9:54 PM 07/05/2016  9:22 PM Full Code [id number]  Demetrios Loll, MD Inpatient   06/24/2016  4:23 AM 06/26/2016  3:46 PM Full Code [id number]  Saundra Shelling, MD Inpatient   06/14/2016  2:53 PM 06/19/2016  9:04 PM Full Code [id number]  Hillary Bow, MD ED   05/31/2016  6:42 AM 06/01/2016  9:30 PM Full Code [id number]  Saundra Shelling, MD Inpatient   05/05/2016 11:09 PM 05/10/2016  9:57 PM Full Code [id number]  Vianne Bulls, MD  Inpatient   04/16/2016  3:56 AM 04/17/2016  6:27 PM Full Code [id number]  Saundra Shelling, MD Inpatient   01/19/2016  3:13 PM 01/22/2016  2:14 PM Full Code [id number]  Loletha Grayer, MD ED   09/24/2015 10:33 AM 09/25/2015  1:31 PM Full Code [id number]  Loletha Grayer, MD ED   05/16/2015  2:17 PM 05/17/2015  8:57 PM Full Code [id number]  Idelle Crouch, MD Inpatient   04/30/2015  8:08 PM 05/08/2015  7:40 PM Full Code [id number]  Vaughan Basta, MD Inpatient   04/06/2015  5:07 AM 04/07/2015  5:36 PM Full Code [id number]  Lance Coon, MD Inpatient   03/23/2015 11:07 AM 03/27/2015 12:46 PM Full Code [id number]  Bettey Costa, MD Inpatient   06/19/2013 11:32 AM 06/21/2013  5:49 PM Full Code [id number]  Cristal Ford, DO Inpatient   06/16/2013  5:26 PM 06/19/2013 11:32 AM Full Code  [id number]  Margarita Mail, PA-C ED   06/05/2013  3:33 PM 06/06/2013  6:56 PM Full Code [id number]  Theodoro Kos, DO Inpatient   06/02/2013  9:01 AM 06/05/2013  3:33 PM Full Code [id number]  Velvet Bathe, MD Inpatient   06/02/2013  8:40 AM 06/02/2013  9:01 AM Full Code [id number]  Velvet Bathe, MD Inpatient      TOTAL TIME TAKING CARE OF THIS PATIENT: 30 minutes.    Loletha Grayer M.D on 08/20/2016 at 4:06 PM  Between 7am to 6pm - Pager - [phone]  After 6pm go to [email or web address] - password [email or web address]  [employer] Office  [phone]  CC: Primary care physician; [name], MD

## 2016-08-23 DIAGNOSIS — N186 End stage renal disease: Secondary | ICD-10-CM | POA: Diagnosis not present

## 2016-08-23 DIAGNOSIS — D649 Anemia, unspecified: Secondary | ICD-10-CM | POA: Diagnosis not present

## 2016-08-24 ENCOUNTER — Other Ambulatory Visit: Payer: Self-pay | Admitting: Nephrology

## 2016-08-24 DIAGNOSIS — R188 Other ascites: Secondary | ICD-10-CM

## 2016-08-25 ENCOUNTER — Ambulatory Visit
Admission: RE | Admit: 2016-08-25 | Discharge: 2016-08-25 | Disposition: A | Discharge status: Home / Self Care | Payer: Medicare Other | Source: Ambulatory Visit | Attending: Nephrology | Admitting: Nephrology

## 2016-08-25 DIAGNOSIS — R188 Other ascites: Secondary | ICD-10-CM | POA: Insufficient documentation

## 2016-08-25 MED ORDER — SODIUM CHLORIDE FLUSH 0.9 % IV SOLN
INTRAVENOUS | Status: AC
  Administered 2016-08-25: 10 mL
  Filled 2016-08-25: qty 10

## 2016-08-25 MED ORDER — ALBUMIN HUMAN 25 % IV SOLN
25.0000 g | Freq: Once | INTRAVENOUS | Status: AC
  Administered 2016-08-25: 25 g via INTRAVENOUS

## 2016-08-25 MED ORDER — ALBUMIN HUMAN 25 % IV SOLN
INTRAVENOUS | Status: AC
Start: 2016-08-25 — End: 2016-08-25
  Administered 2016-08-25: 25 g via INTRAVENOUS
  Filled 2016-08-25: qty 100

## 2016-08-30 DIAGNOSIS — N186 End stage renal disease: Secondary | ICD-10-CM | POA: Diagnosis not present

## 2016-08-30 DIAGNOSIS — D649 Anemia, unspecified: Secondary | ICD-10-CM | POA: Diagnosis not present

## 2016-09-01 DIAGNOSIS — D649 Anemia, unspecified: Secondary | ICD-10-CM | POA: Diagnosis not present

## 2016-09-01 DIAGNOSIS — N186 End stage renal disease: Secondary | ICD-10-CM | POA: Diagnosis not present

## 2016-09-06 DIAGNOSIS — D649 Anemia, unspecified: Secondary | ICD-10-CM | POA: Diagnosis not present

## 2016-09-06 DIAGNOSIS — N186 End stage renal disease: Secondary | ICD-10-CM | POA: Diagnosis not present

## 2016-09-08 DIAGNOSIS — N186 End stage renal disease: Secondary | ICD-10-CM | POA: Diagnosis not present

## 2016-09-08 DIAGNOSIS — D649 Anemia, unspecified: Secondary | ICD-10-CM | POA: Diagnosis not present

## 2016-09-10 DIAGNOSIS — Z992 Dependence on renal dialysis: Secondary | ICD-10-CM | POA: Diagnosis not present

## 2016-09-10 DIAGNOSIS — N186 End stage renal disease: Secondary | ICD-10-CM | POA: Diagnosis not present

## 2016-09-13 DIAGNOSIS — N2581 Secondary hyperparathyroidism of renal origin: Secondary | ICD-10-CM | POA: Diagnosis not present

## 2016-09-13 DIAGNOSIS — N186 End stage renal disease: Secondary | ICD-10-CM | POA: Diagnosis not present

## 2016-09-13 DIAGNOSIS — D649 Anemia, unspecified: Secondary | ICD-10-CM | POA: Diagnosis not present

## 2016-09-15 ENCOUNTER — Encounter: Payer: Self-pay | Admitting: Emergency Medicine

## 2016-09-15 ENCOUNTER — Emergency Department: Payer: Medicare Other

## 2016-09-15 ENCOUNTER — Inpatient Hospital Stay
Admission: EM | Admit: 2016-09-15 | Discharge: 2016-09-16 | DRG: 811 | Disposition: A | Discharge status: Home / Self Care | Payer: Medicare Other | Attending: Specialist | Admitting: Specialist

## 2016-09-15 DIAGNOSIS — J449 Chronic obstructive pulmonary disease, unspecified: Secondary | ICD-10-CM | POA: Diagnosis present

## 2016-09-15 DIAGNOSIS — E1140 Type 2 diabetes mellitus with diabetic neuropathy, unspecified: Secondary | ICD-10-CM | POA: Diagnosis not present

## 2016-09-15 DIAGNOSIS — Z79899 Other long term (current) drug therapy: Secondary | ICD-10-CM | POA: Diagnosis not present

## 2016-09-15 DIAGNOSIS — D631 Anemia in chronic kidney disease: Secondary | ICD-10-CM | POA: Diagnosis present

## 2016-09-15 DIAGNOSIS — D649 Anemia, unspecified: Secondary | ICD-10-CM | POA: Diagnosis present

## 2016-09-15 DIAGNOSIS — N2581 Secondary hyperparathyroidism of renal origin: Secondary | ICD-10-CM | POA: Diagnosis present

## 2016-09-15 DIAGNOSIS — K7031 Alcoholic cirrhosis of liver with ascites: Secondary | ICD-10-CM | POA: Diagnosis present

## 2016-09-15 DIAGNOSIS — E875 Hyperkalemia: Secondary | ICD-10-CM | POA: Diagnosis not present

## 2016-09-15 DIAGNOSIS — R188 Other ascites: Secondary | ICD-10-CM | POA: Diagnosis not present

## 2016-09-15 DIAGNOSIS — E1122 Type 2 diabetes mellitus with diabetic chronic kidney disease: Secondary | ICD-10-CM | POA: Diagnosis present

## 2016-09-15 DIAGNOSIS — Z8659 Personal history of other mental and behavioral disorders: Secondary | ICD-10-CM

## 2016-09-15 DIAGNOSIS — I509 Heart failure, unspecified: Secondary | ICD-10-CM

## 2016-09-15 DIAGNOSIS — K219 Gastro-esophageal reflux disease without esophagitis: Secondary | ICD-10-CM | POA: Diagnosis present

## 2016-09-15 DIAGNOSIS — K7290 Hepatic failure, unspecified without coma: Secondary | ICD-10-CM | POA: Diagnosis present

## 2016-09-15 DIAGNOSIS — Z720 Tobacco use: Secondary | ICD-10-CM | POA: Diagnosis not present

## 2016-09-15 DIAGNOSIS — E785 Hyperlipidemia, unspecified: Secondary | ICD-10-CM | POA: Diagnosis present

## 2016-09-15 DIAGNOSIS — E1142 Type 2 diabetes mellitus with diabetic polyneuropathy: Secondary | ICD-10-CM | POA: Diagnosis present

## 2016-09-15 DIAGNOSIS — I739 Peripheral vascular disease, unspecified: Secondary | ICD-10-CM | POA: Diagnosis present

## 2016-09-15 DIAGNOSIS — E1151 Type 2 diabetes mellitus with diabetic peripheral angiopathy without gangrene: Secondary | ICD-10-CM | POA: Diagnosis present

## 2016-09-15 DIAGNOSIS — K922 Gastrointestinal hemorrhage, unspecified: Secondary | ICD-10-CM | POA: Diagnosis present

## 2016-09-15 DIAGNOSIS — N186 End stage renal disease: Secondary | ICD-10-CM | POA: Diagnosis not present

## 2016-09-15 DIAGNOSIS — F17210 Nicotine dependence, cigarettes, uncomplicated: Secondary | ICD-10-CM | POA: Diagnosis present

## 2016-09-15 DIAGNOSIS — Z7982 Long term (current) use of aspirin: Secondary | ICD-10-CM

## 2016-09-15 DIAGNOSIS — Z951 Presence of aortocoronary bypass graft: Secondary | ICD-10-CM

## 2016-09-15 DIAGNOSIS — D509 Iron deficiency anemia, unspecified: Principal | ICD-10-CM | POA: Diagnosis present

## 2016-09-15 DIAGNOSIS — I2510 Atherosclerotic heart disease of native coronary artery without angina pectoris: Secondary | ICD-10-CM | POA: Diagnosis present

## 2016-09-15 DIAGNOSIS — I248 Other forms of acute ischemic heart disease: Secondary | ICD-10-CM | POA: Diagnosis present

## 2016-09-15 DIAGNOSIS — Z7951 Long term (current) use of inhaled steroids: Secondary | ICD-10-CM

## 2016-09-15 DIAGNOSIS — K7460 Unspecified cirrhosis of liver: Secondary | ICD-10-CM | POA: Diagnosis not present

## 2016-09-15 DIAGNOSIS — I132 Hypertensive heart and chronic kidney disease with heart failure and with stage 5 chronic kidney disease, or end stage renal disease: Secondary | ICD-10-CM | POA: Diagnosis present

## 2016-09-15 DIAGNOSIS — F1010 Alcohol abuse, uncomplicated: Secondary | ICD-10-CM | POA: Diagnosis present

## 2016-09-15 DIAGNOSIS — I120 Hypertensive chronic kidney disease with stage 5 chronic kidney disease or end stage renal disease: Secondary | ICD-10-CM | POA: Diagnosis not present

## 2016-09-15 DIAGNOSIS — Z992 Dependence on renal dialysis: Secondary | ICD-10-CM | POA: Diagnosis not present

## 2016-09-15 DIAGNOSIS — R6889 Other general symptoms and signs: Secondary | ICD-10-CM | POA: Diagnosis not present

## 2016-09-15 LAB — COMPREHENSIVE METABOLIC PANEL
ALBUMIN: 2.9 g/dL — AB (ref 3.5–5.0)
ALK PHOS: 127 U/L — AB (ref 38–126)
ALT: 16 U/L — AB (ref 17–63)
ANION GAP: 11 (ref 5–15)
AST: 28 U/L (ref 15–41)
BUN: 30 mg/dL — ABNORMAL HIGH (ref 6–20)
CALCIUM: 8.4 mg/dL — AB (ref 8.9–10.3)
CO2: 29 mmol/L (ref 22–32)
Chloride: 96 mmol/L — ABNORMAL LOW (ref 101–111)
Creatinine, Ser: 5.54 mg/dL — ABNORMAL HIGH (ref 0.61–1.24)
GFR calc Af Amer: 12 mL/min — ABNORMAL LOW (ref >60)
GFR calc non Af Amer: 10 mL/min — ABNORMAL LOW (ref >60)
GLUCOSE: 128 mg/dL — AB (ref 65–99)
Potassium: 4.5 mmol/L (ref 3.5–5.1)
SODIUM: 136 mmol/L (ref 135–145)
Total Bilirubin: 0.6 mg/dL (ref 0.3–1.2)
Total Protein: 6.7 g/dL (ref 6.5–8.1)

## 2016-09-15 LAB — CBC WITH DIFFERENTIAL/PLATELET
BASOS PCT: 1 %
Basophils Absolute: 0.1 10*3/uL (ref 0–0.1)
EOS ABS: 0.1 10*3/uL (ref 0–0.7)
Eosinophils Relative: 1 %
HCT: 19.2 % — ABNORMAL LOW (ref 40.0–52.0)
HEMOGLOBIN: 5.9 g/dL — AB (ref 13.0–18.0)
Lymphocytes Relative: 9 %
Lymphs Abs: 0.7 10*3/uL — ABNORMAL LOW (ref 1.0–3.6)
MCH: 25.8 pg — ABNORMAL LOW (ref 26.0–34.0)
MCHC: 30.9 g/dL — AB (ref 32.0–36.0)
MCV: 83.7 fL (ref 80.0–100.0)
Monocytes Absolute: 0.9 10*3/uL (ref 0.2–1.0)
Monocytes Relative: 12 %
NEUTROS PCT: 77 %
Neutro Abs: 5.6 10*3/uL (ref 1.4–6.5)
Platelets: 288 10*3/uL (ref 150–440)
RBC: 2.3 MIL/uL — ABNORMAL LOW (ref 4.40–5.90)
RDW: 21 % — ABNORMAL HIGH (ref 11.5–14.5)
WBC: 7.4 10*3/uL (ref 3.8–10.6)

## 2016-09-15 LAB — PROTIME-INR
INR: 1.19
Prothrombin Time: 15.2 seconds (ref 11.4–15.2)

## 2016-09-15 LAB — TROPONIN I: TROPONIN I: 0.06 ng/mL — AB (ref <0.03)

## 2016-09-15 LAB — BRAIN NATRIURETIC PEPTIDE: B Natriuretic Peptide: 2716 pg/mL — ABNORMAL HIGH (ref 0.0–100.0)

## 2016-09-15 LAB — PREPARE RBC (CROSSMATCH)

## 2016-09-15 MED ORDER — ACETAMINOPHEN 325 MG PO TABS: 650.0000 mg | ORAL_TABLET | Freq: Four times a day (QID) | ORAL | Status: DC | PRN

## 2016-09-15 MED ORDER — SODIUM CHLORIDE 0.9 % IV SOLN
10.0000 mL/h | Freq: Once | INTRAVENOUS | Status: AC
  Administered 2016-09-15: 10 mL/h via INTRAVENOUS

## 2016-09-15 MED ORDER — MOMETASONE FURO-FORMOTEROL FUM 200-5 MCG/ACT IN AERO
2.0000 | INHALATION_SPRAY | Freq: Two times a day (BID) | RESPIRATORY_TRACT | Status: DC
  Administered 2016-09-15: 2 via RESPIRATORY_TRACT
  Filled 2016-09-15: qty 8.8

## 2016-09-15 MED ORDER — ONDANSETRON HCL 4 MG/2ML IJ SOLN: 4.0000 mg | Freq: Four times a day (QID) | INTRAMUSCULAR | Status: DC | PRN

## 2016-09-15 MED ORDER — LABETALOL HCL 100 MG PO TABS
100.0000 mg | ORAL_TABLET | Freq: Every day | ORAL | Status: DC
  Filled 2016-09-15: qty 1

## 2016-09-15 MED ORDER — GUAIFENESIN-DM 100-10 MG/5ML PO SYRP
15.0000 mL | ORAL_SOLUTION | ORAL | Status: DC | PRN
  Administered 2016-09-15: 15 mL via ORAL
  Filled 2016-09-15: qty 15

## 2016-09-15 MED ORDER — NITROGLYCERIN 0.4 MG SL SUBL: 0.4000 mg | SUBLINGUAL_TABLET | SUBLINGUAL | Status: DC | PRN

## 2016-09-15 MED ORDER — HYDROCOD POLST-CPM POLST ER 10-8 MG/5ML PO SUER
5.0000 mL | Freq: Every evening | ORAL | Status: DC | PRN
  Administered 2016-09-16: 5 mL via ORAL
  Filled 2016-09-15: qty 5

## 2016-09-15 MED ORDER — FUROSEMIDE 40 MG PO TABS
80.0000 mg | ORAL_TABLET | Freq: Every day | ORAL | Status: DC
  Administered 2016-09-16: 80 mg via ORAL
  Filled 2016-09-15: qty 2

## 2016-09-15 MED ORDER — ALBUTEROL SULFATE (2.5 MG/3ML) 0.083% IN NEBU: 3.0000 mL | INHALATION_SOLUTION | Freq: Four times a day (QID) | RESPIRATORY_TRACT | Status: DC | PRN

## 2016-09-15 MED ORDER — SODIUM CHLORIDE 0.9% FLUSH
3.0000 mL | Freq: Two times a day (BID) | INTRAVENOUS | Status: DC
  Administered 2016-09-15: 3 mL via INTRAVENOUS

## 2016-09-15 MED ORDER — SODIUM CHLORIDE 0.9 % IV SOLN: 250.0000 mL | INTRAVENOUS | Status: DC | PRN

## 2016-09-15 MED ORDER — SODIUM CHLORIDE 0.9% FLUSH: 3.0000 mL | INTRAVENOUS | Status: DC | PRN

## 2016-09-15 MED ORDER — RENA-VITE PO TABS
1.0000 | ORAL_TABLET | Freq: Every day | ORAL | Status: DC
  Administered 2016-09-16: 1 via ORAL
  Filled 2016-09-15: qty 1

## 2016-09-15 MED ORDER — AMLODIPINE BESYLATE 5 MG PO TABS
5.0000 mg | ORAL_TABLET | Freq: Every day | ORAL | Status: DC
  Administered 2016-09-16: 5 mg via ORAL
  Filled 2016-09-15: qty 1

## 2016-09-15 MED ORDER — HYDROXYZINE HCL 25 MG PO TABS
25.0000 mg | ORAL_TABLET | Freq: Three times a day (TID) | ORAL | Status: DC | PRN
  Administered 2016-09-15: 25 mg via ORAL
  Filled 2016-09-15: qty 1

## 2016-09-15 MED ORDER — SEVELAMER CARBONATE 800 MG PO TABS
2400.0000 mg | ORAL_TABLET | Freq: Three times a day (TID) | ORAL | Status: DC
  Administered 2016-09-16: 2400 mg via ORAL
  Filled 2016-09-15: qty 3

## 2016-09-15 MED ORDER — ONDANSETRON HCL 4 MG PO TABS
4.0000 mg | ORAL_TABLET | Freq: Four times a day (QID) | ORAL | Status: DC | PRN
Start: 2016-09-15 — End: 2016-09-16

## 2016-09-15 MED ORDER — ATORVASTATIN CALCIUM 20 MG PO TABS
40.0000 mg | ORAL_TABLET | Freq: Every day | ORAL | Status: DC
  Administered 2016-09-16: 40 mg via ORAL
  Filled 2016-09-15: qty 2

## 2016-09-15 MED ORDER — NICOTINE 21 MG/24HR TD PT24
21.0000 mg | MEDICATED_PATCH | Freq: Every day | TRANSDERMAL | Status: DC
Start: 2016-09-15 — End: 2016-09-16
  Administered 2016-09-16: 21 mg via TRANSDERMAL
  Filled 2016-09-15: qty 1

## 2016-09-15 MED ORDER — GABAPENTIN 300 MG PO CAPS
300.0000 mg | ORAL_CAPSULE | Freq: Every day | ORAL | Status: DC
  Administered 2016-09-15 – 2016-09-16 (×2): 300 mg via ORAL
  Filled 2016-09-15 (×2): qty 1

## 2016-09-15 MED ORDER — ACETAMINOPHEN 650 MG RE SUPP: 650.0000 mg | Freq: Four times a day (QID) | RECTAL | Status: DC | PRN

## 2016-09-15 MED ORDER — PANTOPRAZOLE SODIUM 40 MG PO TBEC
40.0000 mg | DELAYED_RELEASE_TABLET | Freq: Every day | ORAL | Status: DC
  Administered 2016-09-16: 40 mg via ORAL
  Filled 2016-09-15: qty 1

## 2016-09-15 NOTE — ED Provider Notes (Addendum)
[facility] Emergency Department Provider Note  ____________________________________________   None    (approximate)  I have reviewed the triage vital signs and the nursing notes.   HISTORY  Chief Complaint Anemia   HPI Stanley [name] is a 56 y.o. male who reports he is getting very short of breath and having trouble walking because of it. He says every time he gets this where it's because he gets anemia from blood loss from rectal bleeding or nosebleeds. He was seen in dialysis today and found to be anemic and sent here for transfusion.   Past Medical History:  Diagnosis Date  . Alcohol abuse   . Cirrhosis ([place])   . Coronary artery disease 2009  . Diabetic peripheral neuropathy ([place])   . Drug abuse   . End stage renal disease on dialysis [facility]) NEPHROLOGIST-   [facility]  IN    HEMODIALYSIS --   TUES/  THURS/  SAT  . GERD (gastroesophageal reflux disease)   . Hyperlipidemia   . Hypertension   . PAD (peripheral artery disease) ([place])   . Renal insufficiency    Per pt, 32 oz fluid restriction per day  . S/P triple vessel bypass 06/09/2016   2009ish  . Suicidal ideation    & HOMICIDAL IDEATION --  06-16-2013   ADMITTED TO BEHAVIOR HEALTH    Patient Active Problem List   Diagnosis Date Noted  . Anemia   . Heme positive stool   . Ulceration of intestine   . Benign neoplasm of transverse colon   . Acute gastrointestinal hemorrhage   . Esophageal candidiasis ([place])   . Angiodysplasia of intestinal tract   . Acute respiratory failure with hypoxia ([place]) 07/03/2016  . GI bleeding 06/24/2016  . Rectal bleeding 06/14/2016  . Anemia of chronic disease 06/01/2016  . MRSA carrier 06/01/2016  . Chronic renal failure 05/23/2016  . Ischemic heart disease 05/23/2016  . Angiodysplasia of small intestine   . Melena   . Small bowel bleed not requiring more than 4 units of blood in 24 hours, ICU, or surgery   . Anemia due to chronic blood loss    . Abdominal pain 05/05/2016  . Acute posthemorrhagic anemia 04/17/2016  . Gastrointestinal bleed 04/17/2016  . History of esophagogastroduodenoscopy (EGD) 04/17/2016  . Elevated troponin 04/17/2016  . Alcohol abuse 04/17/2016  . Upper GI bleed 01/19/2016  . Blood in stool   . Angiodysplasia of stomach and duodenum with hemorrhage   . Gastritis   . Esophagitis, unspecified   . GI bleed 05/16/2015  . Acute GI bleeding   . Symptomatic anemia 04/30/2015  . HTN (hypertension) 04/06/2015  . GERD (gastroesophageal reflux disease) 04/06/2015  . HLD (hyperlipidemia) 04/06/2015  . Dyspnea 04/06/2015  . Cirrhosis of liver with ascites ([place]) 04/06/2015  . Ascites 04/06/2015  . GIB (gastrointestinal bleeding) 03/23/2015  . Homicidal ideation 06/19/2013  . Suicidal intent 06/19/2013  . Homicidal ideations 06/19/2013  . Hyperkalemia 06/16/2013  . Mandible fracture ([place]) 06/05/2013  . Fracture, mandible ([place]) 06/02/2013  . Coronary atherosclerosis of native coronary artery 06/02/2013  . ESRD on dialysis ([place]) 06/02/2013  . Mandible open fracture ([place]) 06/02/2013    Past Surgical History:  Procedure Laterality Date  . AGILE CAPSULE N/A 06/19/2016   Procedure: AGILE CAPSULE;  Surgeon: [name], MD;  Location[facility] ENDOSCOPY;  Service: Endoscopy;  Laterality: N/A;  . COLONOSCOPY WITH PROPOFOL N/A 06/18/2016   Procedure: COLONOSCOPY WITH PROPOFOL;  Surgeon: [name], MD;  Location: [facility] ENDOSCOPY;  Service: Endoscopy;  Laterality: N/A;  . COLONOSCOPY WITH PROPOFOL N/A 08/12/2016   Procedure: COLONOSCOPY WITH PROPOFOL;  Surgeon: [name], MD;  Location: [facility] ENDOSCOPY;  Service: Endoscopy;  Laterality: N/A;  . CORONARY ANGIOPLASTY  ?   PT UNABLE TO TELL IF  BEFORE OR AFTER  CABG  . CORONARY ARTERY BYPASS GRAFT  2008  ([place] , [place])   3 VESSEL  . DIALYSIS FISTULA CREATION  LAST SURGERY  APPOX  2008  . ENTEROSCOPY N/A 05/10/2016   Procedure: ENTEROSCOPY;  Surgeon: Jerene Bears, MD;   Location: [place];  Service: Gastroenterology;  Laterality: N/A;  . ENTEROSCOPY N/A 08/12/2016   Procedure: ENTEROSCOPY;  Surgeon: [name], MD;  Location: [facility] ENDOSCOPY;  Service: Endoscopy;  Laterality: N/A;  . ESOPHAGOGASTRODUODENOSCOPY N/A 05/07/2015   Procedure: ESOPHAGOGASTRODUODENOSCOPY (EGD);  Surgeon: Hulen Luster, MD;  Location: [facility] ENDOSCOPY;  Service: Endoscopy;  Laterality: N/A;  . ESOPHAGOGASTRODUODENOSCOPY (EGD) WITH PROPOFOL N/A 05/17/2015   Procedure: ESOPHAGOGASTRODUODENOSCOPY (EGD) WITH PROPOFOL;  Surgeon: [name], MD;  Location: [facility] ENDOSCOPY;  Service: Endoscopy;  Laterality: N/A;  . ESOPHAGOGASTRODUODENOSCOPY (EGD) WITH PROPOFOL N/A 01/20/2016   Procedure: ESOPHAGOGASTRODUODENOSCOPY (EGD) WITH PROPOFOL;  Surgeon: [name], MD;  Location: [facility] ENDOSCOPY;  Service: Endoscopy;  Laterality: N/A;  . ESOPHAGOGASTRODUODENOSCOPY (EGD) WITH PROPOFOL N/A 04/17/2016   Procedure: ESOPHAGOGASTRODUODENOSCOPY (EGD) WITH PROPOFOL;  Surgeon: Lin Landsman, MD;  Location: [facility] ENDOSCOPY;  Service: Gastroenterology;  Laterality: N/A;  . ESOPHAGOGASTRODUODENOSCOPY (EGD) WITH PROPOFOL  05/09/2016   Procedure: ESOPHAGOGASTRODUODENOSCOPY (EGD) WITH PROPOFOL;  Surgeon: Jerene Bears, MD;  Location: [place];  Service: Endoscopy;;  . ESOPHAGOGASTRODUODENOSCOPY (EGD) WITH PROPOFOL N/A 06/16/2016   Procedure: ESOPHAGOGASTRODUODENOSCOPY (EGD) WITH PROPOFOL;  Surgeon: [name], MD;  Location: [facility] ENDOSCOPY;  Service: Endoscopy;  Laterality: N/A;  . GIVENS CAPSULE STUDY N/A 05/07/2016   Procedure: GIVENS CAPSULE STUDY;  Surgeon: Doran Stabler, MD;  Location: [place];  Service: Endoscopy;  Laterality: N/A;  . MANDIBULAR HARDWARE REMOVAL N/A 07/29/2013   Procedure: REMOVAL OF ARCH BARS;  Surgeon: Theodoro Kos, DO;  Location: [place];  Service: Plastics;  Laterality: N/A;  . ORIF MANDIBULAR FRACTURE N/A 06/05/2013   Procedure: REPAIR OF MANDIBULAR FRACTURE x 2 with  maxillo-mandibular fixation ;  Surgeon: Theodoro Kos, DO;  Location: [place];  Service: Plastics;  Laterality: N/A;  . PERIPHERAL ARTERIAL STENT GRAFT Left     Prior to Admission medications   Medication Sig Start Date End Date Taking? Authorizing Provider  albuterol (PROVENTIL HFA;VENTOLIN HFA) 108 (90 Base) MCG/ACT inhaler Inhale 4-6 puffs by mouth every 4 hours as needed for wheezing, cough, and/or shortness of breath 07/11/16   Hinda Kehr, MD  amLODipine (NORVASC) 5 MG tablet Take 5 mg by mouth daily.    [provider]  aspirin EC 81 MG tablet Take 81 mg by mouth daily.    [provider]  atorvastatin (LIPITOR) 40 MG tablet Take 40 mg by mouth daily. 04/19/16   [provider]  budesonide-formoterol (SYMBICORT) 160-4.5 MCG/ACT inhaler Inhale 2 puffs into the lungs daily.    [provider]  furosemide (LASIX) 80 MG tablet Take 80 mg by mouth daily. 05/11/16   [provider]  gabapentin (NEURONTIN) 300 MG capsule Take 1 capsule (300 mg total) by mouth daily. Patient taking differently: Take 300 mg by mouth at bedtime.  03/27/15   Bettey Costa, MD  hydrOXYzine (VISTARIL) 25 MG capsule Take 1 capsule (25 mg total)  by mouth 3 (three) times daily as needed for itching. Patient not taking: Reported on 08/16/2016 02/25/16   Hagler, Jami L, PA-C  labetalol (NORMODYNE) 100 MG tablet Take 1 tablet (100 mg total) by mouth daily. 01/22/16   Epifanio Lesches, MD  levofloxacin (LEVAQUIN) 500 MG tablet One tab daily after dialysis q48 hours for three more doses Patient not taking: Reported on 08/16/2016 08/12/16   Loletha Grayer, MD  Multiple Vitamins-Minerals-FA (DIALYVITE SUPREME D) 3 MG TABS Take 1 tablet by mouth daily. 05/27/16   [provider]  nitroGLYCERIN (NITROSTAT) 0.4 MG SL tablet Place 1 tablet (0.4 mg total) under the tongue every 5 (five) minutes as needed. Patient taking differently: Place 0.4 mg under the tongue every 5 (five) minutes as  needed for chest pain.  04/12/16   Wende Bushy, MD  pantoprazole (PROTONIX) 40 MG tablet Take 1 tablet (40 mg total) by mouth daily. 05/10/16   Ghimire, Henreitta Leber, MD  predniSONE (DELTASONE) 10 MG tablet 4 tablets daily for three days 08/12/16   Loletha Grayer, MD  sevelamer carbonate (RENVELA) 800 MG tablet Take 3 tablets (2,400 mg total) by mouth 3 (three) times daily with meals. 06/01/16   Theodoro Grist, MD    Allergies Patient has no known allergies.  Family History  Problem Relation Age of Onset  . Colon cancer Mother   . Cancer Father   . Cancer Sister   . Kidney disease Brother     Social History Social History  Substance Use Topics  . Smoking status: Current Every Day Smoker    Packs/day: 0.15    Years: 40.00    Types: Cigarettes  . Smokeless tobacco: Never Used  . Alcohol use No     Comment: pt reports quitting after learning about cirrhosis    Review of Systems  Constitutional: No fever/chills Eyes: No visual changes. ENT: No sore throat. Cardiovascular: Denies chest pain. Respiratory: Denies shortness of breath. Gastrointestinal: No abdominal pain.  No nausea, no vomiting.  No diarrhea.  No constipation. Genitourinary: Negative for dysuria. Musculoskeletal: Negative for back pain. Skin: Negative for rash. Neurological: Negative for headaches, focal weakness or numbness.   ____________________________________________   PHYSICAL EXAM:  VITAL SIGNS: ED Triage Vitals [09/15/16 1501]  Enc Vitals Group     BP 127/66     Pulse Rate 91     Resp 12     Temp      Temp src      SpO2 99 %     Weight      Height      Head Circumference      Peak Flow      Pain Score      Pain Loc      Pain Edu?      Excl. in [employer]?     Constitutional: Alert and oriented. Well appearing and in no acute distress. Eyes: Conjunctivae are normal but slightly pale Head: Atraumatic. Nose: No congestion/rhinnorhea. Mouth/Throat: Mucous membranes are moist.  Oropharynx  non-erythematous. Neck: No stridor.  Cardiovascular: Normal rate, regular rhythm. Grossly normal heart sounds.  Good peripheral circulation. Respiratory: Normal respiratory effort.  No retractions. Lungs crackles throughout Gastrointestinal: Soft and nontender.distention. No abdominal bruits. No CVA tenderness. }Musculoskeletal: No lower extremity tenderness 1+ edema.  No joint effusions. Neurologic:  Normal speech and language. No gross focal neurologic deficits are appreciated. Skin:  Skin is warm, dry and intact. No rash noted. Psychiatric: Mood and affect are normal. Speech and behavior are normal.  Rectal: Hemoccult positive stool ____________________________________________   LABS (all labs ordered are listed, but only abnormal results are displayed)  Labs Reviewed  CBC WITH DIFFERENTIAL/PLATELET - Abnormal; Notable for the following:       Result Value   RBC 2.30 (*)    Hemoglobin 5.9 (*)    HCT 19.2 (*)    MCH 25.8 (*)    MCHC 30.9 (*)    RDW 21.0 (*)    Lymphs Abs 0.7 (*)    All other components within normal limits  COMPREHENSIVE METABOLIC PANEL - Abnormal; Notable for the following:    Chloride 96 (*)    Glucose, Bld 128 (*)    BUN 30 (*)    Creatinine, Ser 5.54 (*)    Calcium 8.4 (*)    Albumin 2.9 (*)    ALT 16 (*)    Alkaline Phosphatase 127 (*)    GFR calc non Af Amer 10 (*)    GFR calc Af Amer 12 (*)    All other components within normal limits  BRAIN NATRIURETIC PEPTIDE - Abnormal; Notable for the following:    B Natriuretic Peptide 2,716.0 (*)    All other components within normal limits  PROTIME-INR  TROPONIN I  TYPE AND SCREEN   ____________________________________________  EKG  EKG read and interpreted by me shows normal sinus rhythm heart rate of 90 normal axis nonspecific ST-T wave changes unfortunately for some reason lead 4 could not be made to perform so there is no reading for lead 93m,  when compared to EKG from last month there is diffuse  T-wave inversions in the chest leads. Patient is not having any chest pain or discomfort or shortness of breath is probably is due to his anemia. ____________________________________________  RADIOLOGY  IMPRESSION: Chronic lung changes without evidence of acute cardiopulmonary disease.  Surgical changes of prior median sternotomy and CABG.   Electronically Signed   By: Corrie Mckusick D.O.   On: 09/15/2016 15:45  ____________________________________________   PROCEDURES  Procedure(s) performed:  Procedures  Critical Care performed:  ____________________________________________   INITIAL IMPRESSION / ASSESSMENT AND PLAN / ED COURSE  Pertinent labs & imaging results that were available during my care of the patient were reviewed by me and considered in my medical decision making (see chart for details).     discussed with Dr. Landry Mellow Route he wants to transfuse and see if he needs dialysis in the morning.  Discussed blood transfusion with patient risks and benefits main risk for this patient would be worsening of congestive heart failure. He agrees to transfusion. ____________________________________________   FINAL CLINICAL IMPRESSION(S) / ED DIAGNOSES  Final diagnoses:  Anemia, unspecified type  Congestive heart failure, unspecified HF chronicity, unspecified heart failure type ([place])      NEW MEDICATIONS STARTED DURING THIS VISIT:  New Prescriptions   No medications on file     Note:  This document was prepared using Dragon voice recognition software and may include unintentional dictation errors.    Nena Polio, MD 09/15/16 1622    Nena Polio, MD 09/15/16 1633    Nena Polio, MD 09/15/16 [phone]

## 2016-09-15 NOTE — H&P (Signed)
[facility] NAME: Stanley [name]    MR#:  [id number]  DATE OF BIRTH:  [birth year]  DATE OF ADMISSION:  09/15/2016  PRIMARY CARE PHYSICIAN: Alene Mires Elyse Jarvis, MD   REQUESTING/REFERRING PHYSICIAN: Corinna Capra MD  CHIEF COMPLAINT:   Chief Complaint  Patient presents with  . Anemia    HISTORY OF PRESENT ILLNESS: Stanley [name]  is a 56 y.o. male with a known history of ESRD, alcohol abuse, GERD, DM, CAD, with recurrent anemia Whose had GI evaluations multiple times in the past is presenting to the emergency room with complaint of shortness of breath and weakness. Patient is noted to be severely anemic with a hemoglobin of 5.9. He is being admitted for anemia. He does complain of dark color stools. Denies any abdominal pain nausea vomiting or diarrhea complaints of shortness of breath denies any chest pain.      PAST MEDICAL HISTORY:   Past Medical History:  Diagnosis Date  . Alcohol abuse   . Cirrhosis ([place])   . Coronary artery disease 2009  . Diabetic peripheral neuropathy ([place])   . Drug abuse   . End stage renal disease on dialysis [facility]) NEPHROLOGIST-   DR [facility]  IN [place]   HEMODIALYSIS --   TUES/  THURS/  SAT  . GERD (gastroesophageal reflux disease)   . Hyperlipidemia   . Hypertension   . PAD (peripheral artery disease) ([place])   . Renal insufficiency    Per pt, 32 oz fluid restriction per day  . S/P triple vessel bypass 06/09/2016   2009ish  . Suicidal ideation    & HOMICIDAL IDEATION --  06-16-2013   ADMITTED TO BEHAVIOR HEALTH    PAST SURGICAL HISTORY: Past Surgical History:  Procedure Laterality Date  . AGILE CAPSULE N/A 06/19/2016   Procedure: AGILE CAPSULE;  Surgeon: [name], MD;  Location: [facility] ENDOSCOPY;  Service: Endoscopy;  Laterality: N/A;  . COLONOSCOPY WITH PROPOFOL N/A 06/18/2016   Procedure: COLONOSCOPY WITH PROPOFOL;  Surgeon: [name], MD;  Location: [facility] ENDOSCOPY;  Service: Endoscopy;   Laterality: N/A;  . COLONOSCOPY WITH PROPOFOL N/A 08/12/2016   Procedure: COLONOSCOPY WITH PROPOFOL;  Surgeon: [name], MD;  Location: [facility] ENDOSCOPY;  Service: Endoscopy;  Laterality: N/A;  . CORONARY ANGIOPLASTY  ?   PT UNABLE TO TELL IF  BEFORE OR AFTER  CABG  . CORONARY ARTERY BYPASS GRAFT  2008  ([place] , [place])   3 VESSEL  . DIALYSIS FISTULA CREATION  LAST SURGERY  APPOX  2008  . ENTEROSCOPY N/A 05/10/2016   Procedure: ENTEROSCOPY;  Surgeon: Jerene Bears, MD;  Location: [place];  Service: Gastroenterology;  Laterality: N/A;  . ENTEROSCOPY N/A 08/12/2016   Procedure: ENTEROSCOPY;  Surgeon: [name], MD;  Location: [facility] ENDOSCOPY;  Service: Endoscopy;  Laterality: N/A;  . ESOPHAGOGASTRODUODENOSCOPY N/A 05/07/2015   Procedure: ESOPHAGOGASTRODUODENOSCOPY (EGD);  Surgeon: Hulen Luster, MD;  Location: [facility] ENDOSCOPY;  Service: Endoscopy;  Laterality: N/A;  . ESOPHAGOGASTRODUODENOSCOPY (EGD) WITH PROPOFOL N/A 05/17/2015   Procedure: ESOPHAGOGASTRODUODENOSCOPY (EGD) WITH PROPOFOL;  Surgeon: [name], MD;  Location: [facility] ENDOSCOPY;  Service: Endoscopy;  Laterality: N/A;  . ESOPHAGOGASTRODUODENOSCOPY (EGD) WITH PROPOFOL N/A 01/20/2016   Procedure: ESOPHAGOGASTRODUODENOSCOPY (EGD) WITH PROPOFOL;  Surgeon: [name], MD;  Location: [facility] ENDOSCOPY;  Service: Endoscopy;  Laterality: N/A;  . ESOPHAGOGASTRODUODENOSCOPY (EGD) WITH PROPOFOL N/A 04/17/2016   Procedure: ESOPHAGOGASTRODUODENOSCOPY (EGD) WITH PROPOFOL;  Surgeon: Lin Landsman, MD;  Location: [facility] ENDOSCOPY;  Service: Gastroenterology;  Laterality: N/A;  . ESOPHAGOGASTRODUODENOSCOPY (EGD) WITH PROPOFOL  05/09/2016   Procedure: ESOPHAGOGASTRODUODENOSCOPY (EGD) WITH PROPOFOL;  Surgeon: Jerene Bears, MD;  Location: [place];  Service: Endoscopy;;  . ESOPHAGOGASTRODUODENOSCOPY (EGD) WITH PROPOFOL N/A 06/16/2016   Procedure: ESOPHAGOGASTRODUODENOSCOPY (EGD) WITH PROPOFOL;  Surgeon: [name], MD;  Location: [facility] ENDOSCOPY;  Service:  Endoscopy;  Laterality: N/A;  . GIVENS CAPSULE STUDY N/A 05/07/2016   Procedure: GIVENS CAPSULE STUDY;  Surgeon: Doran Stabler, MD;  Location: [place];  Service: Endoscopy;  Laterality: N/A;  . MANDIBULAR HARDWARE REMOVAL N/A 07/29/2013   Procedure: REMOVAL OF ARCH BARS;  Surgeon: Theodoro Kos, DO;  Location: [place];  Service: Plastics;  Laterality: N/A;  . ORIF MANDIBULAR FRACTURE N/A 06/05/2013   Procedure: REPAIR OF MANDIBULAR FRACTURE x 2 with maxillo-mandibular fixation ;  Surgeon: Theodoro Kos, DO;  Location: [place];  Service: Plastics;  Laterality: N/A;  . PERIPHERAL ARTERIAL STENT GRAFT Left     SOCIAL HISTORY:  Social History  Substance Use Topics  . Smoking status: Current Every Day Smoker    Packs/day: 0.15    Years: 40.00    Types: Cigarettes  . Smokeless tobacco: Never Used  . Alcohol use No     Comment: pt reports quitting after learning about cirrhosis    FAMILY HISTORY:  Family History  Problem Relation Age of Onset  . Colon cancer Mother   . Cancer Father   . Cancer Sister   . Kidney disease Brother     DRUG ALLERGIES: No Known Allergies  REVIEW OF SYSTEMS:   CONSTITUTIONAL: No fever, Positive fatigue and weakness.  EYES: No blurred or double vision.  EARS, NOSE, AND THROAT: No tinnitus or ear pain.  RESPIRATORY: No cough, shortness of breath, wheezing or hemoptysis.  CARDIOVASCULAR: No chest pain, orthopnea, positive edema.  GASTROINTESTINAL: No nausea, vomiting, diarrhea or abdominal pain. Dark color stools GENITOURINARY: No dysuria, hematuria.  ENDOCRINE: No polyuria, nocturia,  HEMATOLOGY: No anemia, easy bruising or bleeding SKIN: No rash or lesion. MUSCULOSKELETAL: No joint pain or arthritis.   NEUROLOGIC: No tingling, numbness, weakness.  PSYCHIATRY: No anxiety or depression.   MEDICATIONS AT HOME:  Prior to Admission medications   Medication Sig Start Date End Date Taking? Authorizing Provider  albuterol  (PROVENTIL HFA;VENTOLIN HFA) 108 (90 Base) MCG/ACT inhaler Inhale 4-6 puffs by mouth every 4 hours as needed for wheezing, cough, and/or shortness of breath 07/11/16  Yes Hinda Kehr, MD  amLODipine (NORVASC) 5 MG tablet Take 5 mg by mouth daily.   Yes [provider]  aspirin EC 81 MG tablet Take 81 mg by mouth daily.   Yes [provider]  atorvastatin (LIPITOR) 40 MG tablet Take 40 mg by mouth daily. 04/19/16  Yes [provider]  budesonide-formoterol (SYMBICORT) 160-4.5 MCG/ACT inhaler Inhale 2 puffs into the lungs daily.   Yes [provider]  furosemide (LASIX) 80 MG tablet Take 80 mg by mouth daily. 05/11/16  Yes [provider]  gabapentin (NEURONTIN) 300 MG capsule Take 1 capsule (300 mg total) by mouth daily. Patient taking differently: Take 300 mg by mouth at bedtime.  03/27/15  Yes Mody, Ulice Bold, MD  labetalol (NORMODYNE) 100 MG tablet Take 1 tablet (100 mg total) by mouth daily. 01/22/16  Yes Epifanio Lesches, MD  Multiple Vitamins-Minerals-FA (DIALYVITE SUPREME D) 3 MG TABS Take 1 tablet by mouth daily. 05/27/16  Yes [provider]  nitroGLYCERIN (NITROSTAT) 0.4 MG SL tablet Place 1 tablet (0.4 mg  total) under the tongue every 5 (five) minutes as needed. 04/12/16  Yes Wende Bushy, MD  pantoprazole (PROTONIX) 40 MG tablet Take 1 tablet (40 mg total) by mouth daily. 05/10/16  Yes Ghimire, Henreitta Leber, MD  sevelamer carbonate (RENVELA) 800 MG tablet Take 3 tablets (2,400 mg total) by mouth 3 (three) times daily with meals. 06/01/16  Yes Theodoro Grist, MD  hydrOXYzine (VISTARIL) 25 MG capsule Take 1 capsule (25 mg total) by mouth 3 (three) times daily as needed for itching. Patient not taking: Reported on 08/16/2016 02/25/16   Hagler, Jami L, PA-C      PHYSICAL EXAMINATION:   VITAL SIGNS: Blood pressure (!) 113/54, pulse 89, resp. rate 16, SpO2 100 %.  GENERAL:  56 y.o.-year-old patient lying in the bed with no acute distress.  EYES:  Pupils equal, round, reactive to light and accommodation. No scleral icterus. Extraocular muscles intact.  HEENT: Head atraumatic, normocephalic. Oropharynx and nasopharynx clear.  NECK:  Supple, no jugular venous distention. No thyroid enlargement, no tenderness.  LUNGS: Normal breath sounds bilaterally, no wheezing, rales,rhonchi or crepitation. No use of accessory muscles of respiration.  CARDIOVASCULAR: S1, S2 normal. No murmurs, rubs, or gallops.  ABDOMEN: Soft, nontender, nondistended. Bowel sounds present. No organomegaly or mass.  EXTREMITIES: No pedal edema, cyanosis, or clubbing.  NEUROLOGIC: Cranial nerves II through XII are intact. Muscle strength 5/5 in all extremities. Sensation intact. Gait not checked.  PSYCHIATRIC: The patient is alert and oriented x 3.  SKIN: No obvious rash, lesion, or ulcer.   LABORATORY PANEL:   CBC  Recent Labs Lab 09/15/16 1512  WBC 7.4  HGB 5.9*  HCT 19.2*  PLT 288  MCV 83.7  MCH 25.8*  MCHC 30.9*  RDW 21.0*  LYMPHSABS 0.7*  MONOABS 0.9  EOSABS 0.1  BASOSABS 0.1   ------------------------------------------------------------------------------------------------------------------  Chemistries   Recent Labs Lab 09/15/16 1512  NA 136  K 4.5  CL 96*  CO2 29  GLUCOSE 128*  BUN 30*  CREATININE 5.54*  CALCIUM 8.4*  AST 28  ALT 16*  ALKPHOS 127*  BILITOT 0.6   ------------------------------------------------------------------------------------------------------------------ CrCl cannot be calculated (Unknown ideal weight.). ------------------------------------------------------------------------------------------------------------------ No results for input(s): TSH, T4TOTAL, T3FREE, THYROIDAB in the last 72 hours.  Invalid input(s): FREET3   Coagulation profile  Recent Labs Lab 09/15/16 1512  INR 1.19   ------------------------------------------------------------------------------------------------------------------- No  results for input(s): DDIMER in the last 72 hours. -------------------------------------------------------------------------------------------------------------------  Cardiac Enzymes  Recent Labs Lab 09/15/16 1528  TROPONINI 0.06*   ------------------------------------------------------------------------------------------------------------------ Invalid input(s): POCBNP  ---------------------------------------------------------------------------------------------------------------  Urinalysis    Component Value Date/Time   COLORURINE YELLOW (A) 05/03/2015 0815   APPEARANCEUR CLEAR (A) 05/03/2015 0815   APPEARANCEUR Clear 11/13/2013 1314   LABSPEC 1.008 05/03/2015 0815   LABSPEC 1.014 11/13/2013 1314   PHURINE 9.0 (H) 05/03/2015 0815   GLUCOSEU NEGATIVE 05/03/2015 0815   GLUCOSEU 50 mg/dL 11/13/2013 1314   HGBUR NEGATIVE 05/03/2015 0815   BILIRUBINUR NEGATIVE 05/03/2015 0815   BILIRUBINUR Negative 11/13/2013 1314   KETONESUR NEGATIVE 05/03/2015 0815   PROTEINUR 100 (A) 05/03/2015 0815   UROBILINOGEN 0.2 06/22/2014 1230   NITRITE NEGATIVE 05/03/2015 0815   LEUKOCYTESUR NEGATIVE 05/03/2015 0815   LEUKOCYTESUR Negative 11/13/2013 1314     RADIOLOGY: Dg Chest Portable 1 View  Result Date: 09/15/2016 CLINICAL DATA:  56 year old male with a history of anemia EXAM: PORTABLE CHEST 1 VIEW COMPARISON:  08/16/2016 FINDINGS: Cardiomediastinal silhouette unchanged in size and contour. Surgical changes of median sternotomy and CABG. Calcifications of the  aortic arch. No pneumothorax. No pleural effusion. No confluent airspace disease. Coarsened interstitial markings, similar to prior. No displaced fracture. IMPRESSION: Chronic lung changes without evidence of acute cardiopulmonary disease. Surgical changes of prior median sternotomy and CABG. Electronically Signed   By: Corrie Mckusick D.O.   On: 09/15/2016 15:45    EKG: Orders placed or performed during the hospital encounter of 09/15/16   . EKG 12-Lead  . EKG 12-Lead    IMPRESSION AND PLAN: Patient is a 56 year old African-American male with history of end-stage renal disease and anemia who is presenting to the emergency room with complaint of weakness shortness of breath  1. Anemia Acute on chronic Patient is symptomatic, patient has been consented for transfusion The emergency room physician has discussed with nephrology Daryl K with patient receiving transfusion he was hemodialyzed for 2 hours earlier today I will ask GI to see him again due to dark tarry stools Continue PPIs  2. End-stage renal disease nephrology consult  3. Elevated troponin due to demand ischemia  4. Essential hypertension continue amlodipine and labetalol  5. Hyperlipidemia continue Lipitor  6. Nicotine abuse smoking cessation provided strongly recommend he stop smoking 4 minutes spent   All the records are reviewed and case discussed with ED provider. Management plans discussed with the patient, family and they are in agreement.  CODE STATUS: Code Status History    Date Active Date Inactive Code Status Order ID Comments User Context   08/16/2016  9:45 AM 08/18/2016  6:16 PM Full Code [id number]  Hillary Bow, MD ED   08/09/2016  4:59 PM 08/12/2016  8:34 PM Full Code [id number]  Fritzi Mandes, MD Inpatient   07/03/2016  9:54 PM 07/05/2016  9:22 PM Full Code [id number]  Demetrios Loll, MD Inpatient   06/24/2016  4:23 AM 06/26/2016  3:46 PM Full Code [id number]  Saundra Shelling, MD Inpatient   06/14/2016  2:53 PM 06/19/2016  9:04 PM Full Code [id number]  Hillary Bow, MD ED   05/31/2016  6:42 AM 06/01/2016  9:30 PM Full Code [id number]  Saundra Shelling, MD Inpatient   05/05/2016 11:09 PM 05/10/2016  9:57 PM Full Code [id number]  Vianne Bulls, MD Inpatient   04/16/2016  3:56 AM 04/17/2016  6:27 PM Full Code [id number]  Saundra Shelling, MD Inpatient   01/19/2016  3:13 PM 01/22/2016  2:14 PM Full Code [id number]  Loletha Grayer, MD ED   09/24/2015 10:33 AM 09/25/2015  1:31  PM Full Code [id number]  Loletha Grayer, MD ED   05/16/2015  2:17 PM 05/17/2015  8:57 PM Full Code [id number]  Idelle Crouch, MD Inpatient   04/30/2015  8:08 PM 05/08/2015  7:40 PM Full Code [id number]  Vaughan Basta, MD Inpatient   04/06/2015  5:07 AM 04/07/2015  5:36 PM Full Code [id number]  Lance Coon, MD Inpatient   03/23/2015 11:07 AM 03/27/2015 12:46 PM Full Code [id number]  Bettey Costa, MD Inpatient   06/19/2013 11:32 AM 06/21/2013  5:49 PM Full Code [id number]  Cristal Ford, DO Inpatient   06/16/2013  5:26 PM 06/19/2013 11:32 AM Full Code [id number]  Margarita Mail, PA-C ED   06/05/2013  3:33 PM 06/06/2013  6:56 PM Full Code [id number]  Theodoro Kos, DO Inpatient   06/02/2013  9:01 AM 06/05/2013  3:33 PM Full Code [id number]  Velvet Bathe, MD Inpatient   06/02/2013  8:40 AM 06/02/2013  9:01 AM Full Code [id number]  Velvet Bathe, MD Inpatient       TOTAL TIME TAKING CARE OF THIS  PATIENT: 55 minutes.    Dustin Flock M.D on 09/15/2016 at 4:53 PM  Between 7am to 6pm - Pager - [phone]  After 6pm go to [email or web address] - password [email or web address] [facility]  Office  [phone]  CC: Primary care physician; [name], MD

## 2016-09-15 NOTE — ED Notes (Signed)
Attempted to call report. RN getting patient was in isolation room will call me back

## 2016-09-15 NOTE — ED Notes (Signed)
IV access attempted x 1 by tommy EMTP

## 2016-09-15 NOTE — ED Notes (Signed)
Patient states that he went to dialysis this morning and he had his blood done Tuesday and he knew then his "blood was low" because he couldn't walk good. Per Patient they told him today that the doctor offered to send him to the hospital on Tuesday,(per patient this is not true). Patient states to this RN and EDP that he looses blood from his rectum, bowels and nose. EDP done rectum hemoccult which was positive for blood.

## 2016-09-15 NOTE — ED Triage Notes (Signed)
Pt brought in by [facility] from dialysis for low hemoglobin. VSS with EMS. Pt does not appear to be in any distress.

## 2016-09-15 NOTE — Progress Notes (Signed)
Called Dr. Ara Kussmaul regarding stronger cough medication per patient request.  Appropriate orders were placed.  Christene Slates  09/15/2016 11:30 PM

## 2016-09-15 NOTE — ED Notes (Signed)
Called pharmacy about robitussin

## 2016-09-16 ENCOUNTER — Inpatient Hospital Stay: Payer: Medicare Other

## 2016-09-16 DIAGNOSIS — D649 Anemia, unspecified: Secondary | ICD-10-CM

## 2016-09-16 LAB — GLUCOSE, CAPILLARY
GLUCOSE-CAPILLARY: 82 mg/dL (ref 65–99)
GLUCOSE-CAPILLARY: 103 mg/dL — AB (ref 65–99)

## 2016-09-16 LAB — BASIC METABOLIC PANEL
ANION GAP: 11 (ref 5–15)
BUN: 38 mg/dL — ABNORMAL HIGH (ref 6–20)
CHLORIDE: 99 mmol/L — AB (ref 101–111)
CO2: 30 mmol/L (ref 22–32)
Calcium: 8.5 mg/dL — ABNORMAL LOW (ref 8.9–10.3)
Creatinine, Ser: 7.12 mg/dL — ABNORMAL HIGH (ref 0.61–1.24)
GFR, EST AFRICAN AMERICAN: 9 mL/min — AB (ref >60)
GFR, EST NON AFRICAN AMERICAN: 8 mL/min — AB (ref >60)
Glucose, Bld: 131 mg/dL — ABNORMAL HIGH (ref 65–99)
POTASSIUM: 4.9 mmol/L (ref 3.5–5.1)
SODIUM: 140 mmol/L (ref 135–145)

## 2016-09-16 LAB — CBC
HCT: 21.6 % — ABNORMAL LOW (ref 40.0–52.0)
HEMATOCRIT: 23.3 % — AB (ref 40.0–52.0)
HEMOGLOBIN: 7.3 g/dL — AB (ref 13.0–18.0)
Hemoglobin: 6.7 g/dL — ABNORMAL LOW (ref 13.0–18.0)
MCH: 26.4 pg (ref 26.0–34.0)
MCH: 26.6 pg (ref 26.0–34.0)
MCHC: 31.2 g/dL — ABNORMAL LOW (ref 32.0–36.0)
MCHC: 31.2 g/dL — AB (ref 32.0–36.0)
MCV: 84.8 fL (ref 80.0–100.0)
MCV: 85.3 fL (ref 80.0–100.0)
Platelets: 226 10*3/uL (ref 150–440)
Platelets: 228 10*3/uL (ref 150–440)
RBC: 2.55 MIL/uL — AB (ref 4.40–5.90)
RBC: 2.73 MIL/uL — AB (ref 4.40–5.90)
RDW: 18.9 % — ABNORMAL HIGH (ref 11.5–14.5)
RDW: 18.8 % — ABNORMAL HIGH (ref 11.5–14.5)
WBC: 6.1 10*3/uL (ref 3.8–10.6)
WBC: 6.5 10*3/uL (ref 3.8–10.6)

## 2016-09-16 LAB — PREPARE RBC (CROSSMATCH)

## 2016-09-16 MED ORDER — SODIUM CHLORIDE 0.9 % IV SOLN: Freq: Once | INTRAVENOUS | Status: DC

## 2016-09-16 MED ORDER — ALBUMIN HUMAN 25 % IV SOLN
25.0000 g | Freq: Once | INTRAVENOUS | Status: AC
  Administered 2016-09-16: 25 g via INTRAVENOUS
  Filled 2016-09-16: qty 100

## 2016-09-16 MED ORDER — ALBUMIN HUMAN 25 % IV SOLN: 25.0000 g | Freq: Once | INTRAVENOUS | Status: DC

## 2016-09-16 NOTE — Progress Notes (Signed)
[facility]  ROUNDING NOTE   Subjective:   [name] admitted to [facility] on 09/15/2016 for Anemia, unspecified type [D64.9] Congestive heart failure, unspecified HF chronicity, unspecified heart failure type ([place]) [I50.9]  Patient only completed 2 hours of his dialysis treatment yesterday.   Paracentesis and IV albumin today. 7.3 litres removed.   Objective:  Vital signs in last 24 hours:  Temp:  [97.5 F (36.4 C)-100.5 F (38.1 C)] 98.4 F (36.9 C) (07/06 1243) Pulse Rate:  [56-92] 86 (07/06 1243) Resp:  [12-25] 14 (07/06 1128) BP: (101-136)/(42-78) 125/67 (07/06 1243) SpO2:  [93 %-100 %] 100 % (07/06 1243) FiO2 (%):  [21 %] 21 % (07/05 1758) Weight:  [93 kg (205 lb)] 93 kg (205 lb) (07/05 1842)  Weight change:  Filed Weights   09/15/16 1842  Weight: 93 kg (205 lb)    Intake/Output: I/O last 3 completed shifts: In: 67 [P.O.:120; I.V.:33; Blood:563] Out: 350 [Urine:350]   Intake/Output this shift:  Total I/O In: 240 [P.O.:240] Out: -   Physical Exam: General: NAD,   Head: Normocephalic, atraumatic. Moist oral mucosal membranes  Eyes: Anicteric, PERRL  Neck: Supple, trachea midline  Lungs:  Clear to auscultation  Heart: Regular rate and rhythm  Abdomen:  +ascites  Extremities:  + peripheral edema.  Neurologic: Nonfocal, moving all four extremities  Skin: No lesions  Access: Right AVF    Basic Metabolic Panel:  Recent Labs Lab 09/15/16 1512 09/16/16 0517  NA 136 140  K 4.5 4.9  CL 96* 99*  CO2 29 30  GLUCOSE 128* 131*  BUN 30* 38*  CREATININE 5.54* 7.12*  CALCIUM 8.4* 8.5*    Liver Function Tests:  Recent Labs Lab 09/15/16 1512  AST 28  ALT 16*  ALKPHOS 127*  BILITOT 0.6  PROT 6.7  ALBUMIN 2.9*   No results for input(s): LIPASE, AMYLASE in the last 168 hours. No results for input(s): AMMONIA in the last 168 hours.  CBC:  Recent Labs Lab 09/15/16 1512 09/16/16 0517 09/16/16 0706  WBC 7.4 6.1 6.5   NEUTROABS 5.6  --   --   HGB 5.9* 6.7* 7.3*  HCT 19.2* 21.6* 23.3*  MCV 83.7 84.8 85.3  PLT 288 226 228    Cardiac Enzymes:  Recent Labs Lab 09/15/16 1528  TROPONINI 0.06*    BNP: Invalid input(s): POCBNP  CBG:  Recent Labs Lab 09/16/16 0734 09/16/16 1151  GLUCAP 82 103*    Microbiology: Results for orders placed or performed during the hospital encounter of 08/09/16  MRSA PCR Screening     Status: None   Collection Time: 08/11/16  6:00 PM  Result Value Ref Range Status   MRSA by PCR NEGATIVE NEGATIVE Final    Comment:        The GeneXpert MRSA Assay (FDA approved for NASAL specimens only), is one component of a comprehensive MRSA colonization surveillance program. It is not intended to diagnose MRSA infection nor to guide or monitor treatment for MRSA infections.     Coagulation Studies:  Recent Labs  09/15/16 1512  LABPROT 15.2  INR 1.19    Urinalysis: No results for input(s): COLORURINE, LABSPEC, PHURINE, GLUCOSEU, HGBUR, BILIRUBINUR, KETONESUR, PROTEINUR, UROBILINOGEN, NITRITE, LEUKOCYTESUR in the last 72 hours.  Invalid input(s): APPERANCEUR    Imaging: US Paracentesis  Result Date: 09/16/2016 INDICATION: Patient with recurrent ascites. Request is made for therapeutic paracentesis. EXAM: ULTRASOUND GUIDED THERAPEUTIC PARACENTESIS MEDICATIONS: 10 mL 1% lidocaine COMPLICATIONS: None immediate. PROCEDURE: Informed written consent  was obtained from the patient after a discussion of the risks, benefits and alternatives to treatment. A timeout was performed prior to the initiation of the procedure. Initial ultrasound scanning demonstrates a large amount of ascites within the right lateral abdomen. The right lateral abdomen was prepped and draped in the usual sterile fashion. 1% lidocaine was used for local anesthesia. Following this, a Safe-T-Centesis catheter was introduced. An ultrasound image was saved for documentation purposes. The paracentesis was  performed. The catheter was removed and a dressing was applied. The patient tolerated the procedure well without immediate post procedural complication. FINDINGS: A total of approximately 7.3 liters of clear, yellow fluid was removed. IMPRESSION: Successful ultrasound-guided therapeutic paracentesis yielding 7.3 liters of peritoneal fluid. Read by:  Brynda Greathouse PA-C Electronically Signed   By: Aletta Edouard M.D.   On: 09/16/2016 11:09   Dg Chest Portable 1 View  Result Date: 09/15/2016 CLINICAL DATA:  56 year old male with a history of anemia EXAM: PORTABLE CHEST 1 VIEW COMPARISON:  08/16/2016 FINDINGS: Cardiomediastinal silhouette unchanged in size and contour. Surgical changes of median sternotomy and CABG. Calcifications of the aortic arch. No pneumothorax. No pleural effusion. No confluent airspace disease. Coarsened interstitial markings, similar to prior. No displaced fracture. IMPRESSION: Chronic lung changes without evidence of acute cardiopulmonary disease. Surgical changes of prior median sternotomy and CABG. Electronically Signed   By: Corrie Mckusick D.O.   On: 09/15/2016 15:45     Medications:   . sodium chloride     . amLODipine  5 mg Oral Daily  . atorvastatin  40 mg Oral Daily  . furosemide  80 mg Oral Daily  . gabapentin  300 mg Oral Daily  . labetalol  100 mg Oral Daily  . mometasone-formoterol  2 puff Inhalation BID  . multivitamin  1 tablet Oral Daily  . nicotine  21 mg Transdermal Daily  . pantoprazole  40 mg Oral QAC breakfast  . sevelamer carbonate  2,400 mg Oral TID WC  . sodium chloride flush  3 mL Intravenous Q12H   sodium chloride, acetaminophen **OR** acetaminophen, albuterol, chlorpheniramine-HYDROcodone, hydrOXYzine, nitroGLYCERIN, ondansetron **OR** ondansetron (ZOFRAN) IV, sodium chloride flush  Assessment/ Plan:  [name] is a 56 y.o. black male with end stage renal disease on hemodialysis secondary to [place]'s syndrome, end stage liver  disease with hepatic cirrhosis and ascites, hypertension, anemia of chronic kidney disease, coronary artery disease, peripheral vascular disease, hyperlipidemia  [facility] TTS [place] AVF  1. End Stage Renal Disease: did not complete his dialysis treatment yesterday. Plan on hemodialysis treatment tomorrow. Resume TTS schedule.   2. Anemia of chronic kidney disease: hemoglobin 5.9 on admission. Status post 2 units PRBC on 7/5.  - mircera as outpatient  3. Hypertension: blood pressure at goal.  - amlodipine and furosemide  4. Ascites: secondary to hepatic cirrhosis: large volume paracentesis on 7/6 yielding 7.3 litres.   5. Secondary Hyperparathyroidism: outpatient PTH 673 - elevated. Phosphorus 6.3 - elevated - sevelamer with meals.     LOS: Stanley [name], Stanley [name] 7/6/20181:32 PM

## 2016-09-16 NOTE — Procedures (Signed)
PROCEDURE SUMMARY:  Successful US guided paracentesis from right lateral abodmen.  Yielded 7.3 liters of clear, yellow fluid.  No immediate complications.  Pt tolerated well.   Specimen was not sent for labs.  Docia Barrier PA-C 09/16/2016 11:06 AM

## 2016-09-16 NOTE — Progress Notes (Signed)
Called Dr. Estanislado Pandy regarding patient's Hgb of 6.7 after 2 units of RBC were given.  Doctor put in order for CBC for 0700.  Christene Slates 09/16/2016  6:54 AM

## 2016-09-16 NOTE — Consult Note (Signed)
[name], MD [facility]  [address] [place], [place] [address] Phone: [phone] Fax : [phone]  Consultation  Referring Provider:     Dr. Verdell Carmine Primary Care Physician:  [name], MD Primary Gastroenterologist:  Dr. Hilarie Fredrickson         Reason for Consultation:     Chronic anemia  Date of Admission:  09/15/2016 Date of Consultation:  09/16/2016         HPI:   Stanley [name] is a 56 y.o. male Has been admitted multiple times for anemia. The patient has had extensive workup which has included multiple endoscopies including push Enteroscopy EGDs and colonoscopies in the past. The patient has also had a capsule endoscopy. The patient had a negative workup for any source of his GI bleeding. The patient continues to come in with anemia and needing transfusion due to shortness of breath and dizziness. The patient comes in with the same at this point. The patient was recommended at last admission to undergo a double balloon Enteroscopy to find a source of the patient's GI bleeding. The patient denies any abdominal pain nausea vomiting fevers or chills. He also denies any Black stools or bloody stools.  Past Medical History:  Diagnosis Date  . Alcohol abuse   . Cirrhosis ([place])   . Coronary artery disease 2009  . Diabetic peripheral neuropathy ([place])   . Drug abuse   . End stage renal disease on dialysis [facility]) NEPHROLOGIST-   DR [facility]  IN [place]   HEMODIALYSIS --   TUES/  THURS/  SAT  . GERD (gastroesophageal reflux disease)   . Hyperlipidemia   . Hypertension   . PAD (peripheral artery disease) ([place])   . Renal insufficiency    Per pt, 32 oz fluid restriction per day  . S/P triple vessel bypass 06/09/2016   2009ish  . Suicidal ideation    & HOMICIDAL IDEATION --  06-16-2013   ADMITTED TO BEHAVIOR HEALTH    Past Surgical History:  Procedure Laterality Date  . AGILE CAPSULE N/A 06/19/2016   Procedure: AGILE CAPSULE;  Surgeon: [name], MD;  Location:  [facility] ENDOSCOPY;  Service: Endoscopy;  Laterality: N/A;  . COLONOSCOPY WITH PROPOFOL N/A 06/18/2016   Procedure: COLONOSCOPY WITH PROPOFOL;  Surgeon: [name], MD;  Location: [facility] ENDOSCOPY;  Service: Endoscopy;  Laterality: N/A;  . COLONOSCOPY WITH PROPOFOL N/A 08/12/2016   Procedure: COLONOSCOPY WITH PROPOFOL;  Surgeon: [name], MD;  Location: [facility] ENDOSCOPY;  Service: Endoscopy;  Laterality: N/A;  . CORONARY ANGIOPLASTY  ?   PT UNABLE TO TELL IF  BEFORE OR AFTER  CABG  . CORONARY ARTERY BYPASS GRAFT  2008  ([place] , [place])   3 VESSEL  . DIALYSIS FISTULA CREATION  LAST SURGERY  APPOX  2008  . ENTEROSCOPY N/A 05/10/2016   Procedure: ENTEROSCOPY;  Surgeon: Jerene Bears, MD;  Location: [place];  Service: Gastroenterology;  Laterality: N/A;  . ENTEROSCOPY N/A 08/12/2016   Procedure: ENTEROSCOPY;  Surgeon: [name], MD;  Location: [facility] ENDOSCOPY;  Service: Endoscopy;  Laterality: N/A;  . ESOPHAGOGASTRODUODENOSCOPY N/A 05/07/2015   Procedure: ESOPHAGOGASTRODUODENOSCOPY (EGD);  Surgeon: Hulen Luster, MD;  Location: [facility] ENDOSCOPY;  Service: Endoscopy;  Laterality: N/A;  . ESOPHAGOGASTRODUODENOSCOPY (EGD) WITH PROPOFOL N/A 05/17/2015   Procedure: ESOPHAGOGASTRODUODENOSCOPY (EGD) WITH PROPOFOL;  Surgeon: [name], MD;  Location: [facility] ENDOSCOPY;  Service: Endoscopy;  Laterality: N/A;  . ESOPHAGOGASTRODUODENOSCOPY (EGD) WITH PROPOFOL N/A 01/20/2016   Procedure: ESOPHAGOGASTRODUODENOSCOPY (EGD) WITH PROPOFOL;  Surgeon: [name], MD;  Location: [facility] ENDOSCOPY;  Service: Endoscopy;  Laterality: N/A;  . ESOPHAGOGASTRODUODENOSCOPY (EGD) WITH PROPOFOL N/A 04/17/2016   Procedure: ESOPHAGOGASTRODUODENOSCOPY (EGD) WITH PROPOFOL;  Surgeon: Lin Landsman, MD;  Location: [facility] ENDOSCOPY;  Service: Gastroenterology;  Laterality: N/A;  . ESOPHAGOGASTRODUODENOSCOPY (EGD) WITH PROPOFOL  05/09/2016   Procedure: ESOPHAGOGASTRODUODENOSCOPY (EGD) WITH PROPOFOL;  Surgeon: Jerene Bears, MD;  Location: [place];   Service: Endoscopy;;  . ESOPHAGOGASTRODUODENOSCOPY (EGD) WITH PROPOFOL N/A 06/16/2016   Procedure: ESOPHAGOGASTRODUODENOSCOPY (EGD) WITH PROPOFOL;  Surgeon: [name], MD;  Location: [facility] ENDOSCOPY;  Service: Endoscopy;  Laterality: N/A;  . GIVENS CAPSULE STUDY N/A 05/07/2016   Procedure: GIVENS CAPSULE STUDY;  Surgeon: Doran Stabler, MD;  Location: [place];  Service: Endoscopy;  Laterality: N/A;  . MANDIBULAR HARDWARE REMOVAL N/A 07/29/2013   Procedure: REMOVAL OF ARCH BARS;  Surgeon: Theodoro Kos, DO;  [facility];  Service: Plastics;  Laterality: N/A;  . ORIF MANDIBULAR FRACTURE N/A 06/05/2013   Procedure: REPAIR OF MANDIBULAR FRACTURE x 2 with maxillo-mandibular fixation ;  Surgeon: Theodoro Kos, DO;  Location: [place];  Service: Plastics;  Laterality: N/A;  . PERIPHERAL ARTERIAL STENT GRAFT Left     Prior to Admission medications   Medication Sig Start Date End Date Taking? Authorizing Provider  albuterol (PROVENTIL HFA;VENTOLIN HFA) 108 (90 Base) MCG/ACT inhaler Inhale 4-6 puffs by mouth every 4 hours as needed for wheezing, cough, and/or shortness of breath 07/11/16  Yes Hinda Kehr, MD  amLODipine (NORVASC) 5 MG tablet Take 5 mg by mouth daily.   Yes [provider]  aspirin EC 81 MG tablet Take 81 mg by mouth daily.   Yes [provider]  atorvastatin (LIPITOR) 40 MG tablet Take 40 mg by mouth daily. 04/19/16  Yes [provider]  budesonide-formoterol (SYMBICORT) 160-4.5 MCG/ACT inhaler Inhale 2 puffs into the lungs daily.   Yes [provider]  furosemide (LASIX) 80 MG tablet Take 80 mg by mouth daily. 05/11/16  Yes [provider]  gabapentin (NEURONTIN) 300 MG capsule Take 1 capsule (300 mg total) by mouth daily. Patient taking differently: Take 300 mg by mouth at bedtime.  03/27/15  Yes Mody, Ulice Bold, MD  labetalol (NORMODYNE) 100 MG tablet Take 1 tablet (100 mg total) by mouth daily. 01/22/16  Yes Epifanio Lesches, MD  Multiple Vitamins-Minerals-FA (DIALYVITE SUPREME D) 3 MG TABS Take 1 tablet by mouth daily. 05/27/16  Yes [provider]  nitroGLYCERIN (NITROSTAT) 0.4 MG SL tablet Place 1 tablet (0.4 mg total) under the tongue every 5 (five) minutes as needed. 04/12/16  Yes Wende Bushy, MD  pantoprazole (PROTONIX) 40 MG tablet Take 1 tablet (40 mg total) by mouth daily. 05/10/16  Yes Ghimire, Henreitta Leber, MD  sevelamer carbonate (RENVELA) 800 MG tablet Take 3 tablets (2,400 mg total) by mouth 3 (three) times daily with meals. 06/01/16  Yes Theodoro Grist, MD  hydrOXYzine (VISTARIL) 25 MG capsule Take 1 capsule (25 mg total) by mouth 3 (three) times daily as needed for itching. Patient not taking: Reported on 08/16/2016 02/25/16   Hagler, Judithe Modest, PA-C    Family History  Problem Relation Age of Onset  . Colon cancer Mother   . Cancer Father   . Cancer Sister   . Kidney disease Brother      Social History  Substance Use Topics  . Smoking status: Current Every Day Smoker    Packs/day: 0.15    Years: 40.00  Types: Cigarettes  . Smokeless tobacco: Never Used  . Alcohol use No     Comment: pt reports quitting after learning about cirrhosis    Allergies as of 09/15/2016  . (No Known Allergies)    Review of Systems:    All systems reviewed and negative except where noted in HPI.   Physical Exam:  Vital signs in last 24 hours: Temp:  [97.5 F (36.4 C)-100.5 F (38.1 C)] 98.4 F (36.9 C) (07/06 1243) Pulse Rate:  [56-92] 83 (07/06 1440) Resp:  [13-25] 14 (07/06 1128) BP: (101-136)/(42-78) 129/70 (07/06 1440) SpO2:  [93 %-100 %] 100 % (07/06 1243) FiO2 (%):  [21 %] 21 % (07/05 1758) Weight:  [205 lb (93 kg)] 205 lb (93 kg) (07/05 1842) Last BM Date: 09/15/16 General:   Pleasant, cooperative in NAD Head:  Normocephalic and atraumatic. Eyes:   No icterus.   Conjunctiva pink. PERRLA. Ears:  Normal auditory acuity. Neck:  Supple; no masses or thyroidomegaly Lungs:  Respirations even and unlabored. Lungs clear to auscultation bilaterally.   No wheezes, crackles, or rhonchi.  Heart:  Regular rate and rhythm;  Without murmur, clicks, rubs or gallops Abdomen:  Soft, nondistended, nontender. Normal bowel sounds. No appreciable masses or hepatomegaly.  No rebound or guarding.  Rectal:  Not performed. Msk:  Symmetrical without gross deformities.    Extremities:  Without edema, cyanosis or clubbing. Neurologic:  Alert and oriented x3;  grossly normal neurologically. Skin:  Intact without significant lesions or rashes. Cervical Nodes:  No significant cervical adenopathy. Psych:  Alert and cooperative. Normal affect.  LAB RESULTS:  Recent Labs  09/15/16 1512 09/16/16 0517 09/16/16 0706  WBC 7.4 6.1 6.5  HGB 5.9* 6.7* 7.3*  HCT 19.2* 21.6* 23.3*  PLT 288 226 228   BMET  Recent Labs  09/15/16 1512 09/16/16 0517  NA 136 140  K 4.5 4.9  CL 96* 99*  CO2 29 30  GLUCOSE 128* 131*  BUN 30* 38*  CREATININE 5.54* 7.12*  CALCIUM 8.4* 8.5*   LFT  Recent Labs  09/15/16 1512  PROT 6.7  ALBUMIN 2.9*  AST 28  ALT 16*  ALKPHOS 127*  BILITOT 0.6   PT/INR  Recent Labs  09/15/16 1512  LABPROT 15.2  INR 1.19    STUDIES: US Paracentesis  Result Date: 09/16/2016 INDICATION: Patient with recurrent ascites. Request is made for therapeutic paracentesis. EXAM: ULTRASOUND GUIDED THERAPEUTIC PARACENTESIS MEDICATIONS: 10 mL 1% lidocaine COMPLICATIONS: None immediate. PROCEDURE: Informed written consent was obtained from the patient after a discussion of the risks, benefits and alternatives to treatment. A timeout was performed prior to the initiation of the procedure. Initial ultrasound scanning demonstrates a large amount of ascites within the right lateral abdomen. The right lateral abdomen was prepped and draped in the usual sterile fashion. 1% lidocaine was used for local anesthesia. Following this, a Safe-T-Centesis catheter was introduced. An  ultrasound image was saved for documentation purposes. The paracentesis was performed. The catheter was removed and a dressing was applied. The patient tolerated the procedure well without immediate post procedural complication. FINDINGS: A total of approximately 7.3 liters of clear, yellow fluid was removed. IMPRESSION: Successful ultrasound-guided therapeutic paracentesis yielding 7.3 liters of peritoneal fluid. Read by:  Brynda Greathouse PA-C Electronically Signed   By: Aletta Edouard M.D.   On: 09/16/2016 11:09   Dg Chest Portable 1 View  Result Date: 09/15/2016 CLINICAL DATA:  56 year old male with a history of anemia EXAM: PORTABLE CHEST 1 VIEW COMPARISON:  08/16/2016 FINDINGS: Cardiomediastinal silhouette unchanged in size and contour. Surgical changes of median sternotomy and CABG. Calcifications of the aortic arch. No pneumothorax. No pleural effusion. No confluent airspace disease. Coarsened interstitial markings, similar to prior. No displaced fracture. IMPRESSION: Chronic lung changes without evidence of acute cardiopulmonary disease. Surgical changes of prior median sternotomy and CABG. Electronically Signed   By: Corrie Mckusick D.O.   On: 09/15/2016 15:45      Impression / Plan:   Stanley [name] is a 56 y.o. y/o male with recurrent anemia and the need for transfusions. The patient has had an extensive workup. The patient has been recommended to have a double balloon enteroscopy to look for small bowel source of his bleeding. The patient should be discharged when stable and follow-up with [name] as an outpatient to set up a double balloon enteroscopy.   Thank you for involving me in the care of this patient.      LOS: 1 day   [name], MD  09/16/2016, 3:03 PM   Note: This dictation was prepared with Dragon dictation along with smaller phrase technology. Any transcriptional errors that result from this process are unintentional.

## 2016-09-16 NOTE — Care Management (Signed)
Amanda Morris HD liaison notified of admission and discharge 

## 2016-09-16 NOTE — Discharge Summary (Signed)
[place] [facility] [place] NAME: Stanley [name]    MR#:  [id number]  DATE OF BIRTH:  October 29, 1960  DATE OF ADMISSION:  09/15/2016 ADMITTING PHYSICIAN: Dustin Flock, MD  DATE OF DISCHARGE: 09/16/2016  PRIMARY CARE PHYSICIAN: [name], MD    ADMISSION DIAGNOSIS:  Anemia, unspecified type [D64.9] Congestive heart failure, unspecified HF chronicity, unspecified heart failure type ([place]) [I50.9]  DISCHARGE DIAGNOSIS:  Active Problems:   Anemia   SECONDARY DIAGNOSIS:   Past Medical History:  Diagnosis Date  . Alcohol abuse   . Cirrhosis ([place])   . Coronary artery disease 2009  . Diabetic peripheral neuropathy ([place])   . Drug abuse   . End stage renal disease on dialysis [facility]) NEPHROLOGIST-   DR [facility]  IN [place]   HEMODIALYSIS --   TUES/  THURS/  SAT  . GERD (gastroesophageal reflux disease)   . Hyperlipidemia   . Hypertension   . PAD (peripheral artery disease) ([place])   . Renal insufficiency    Per pt, 32 oz fluid restriction per day  . S/P triple vessel bypass 06/09/2016   2009ish  . Suicidal ideation    & HOMICIDAL IDEATION --  06-16-2013   ADMITTED TO BEHAVIOR HEALTH    HOSPITAL COURSE:   56 year old male with past medical history of end-stage renal disease on hemodialysis, hypertension, hyperlipidemia, peripheral vascular disease, alcohol abuse with liver cirrhosis with history of ascites who presented to the hospital due to shortness of breath and weakness and noted to be severely anemic with a hemoglobin of 5.9.  1. Acute on chronic anemia-patient has chronic anemia secondary to end-stage renal disease. His hemoglobin is down to 5.9. He's had a previous extensive gastroenterology workup with endoscopy, colonoscopy and also capsule Endoscopy prior to his hospitalization. Patient has not followed up with gastroenterology. Presently he had no hematochezia or melena.  -he was transfused total of 2 units of packed blood cells  and hemoglobin is improved to 7.3 and is stable. He's therefore being discharged home and continue follow-up with his primary care physician. He should have his hemoglobin checked within a few days at dialysis.  2. Liver cirrhosis due to alcohol abuse with ascites-patient presented with significant abdominal distention with ascites. -He is status post therapeutic paracentesis with 7.3 L of fluid removed. He was given albumin post-paracentesis. -He will continue his Lasix at home.  3. [name]-patient's gets dialysis on Tuesday, Thursday, Saturday and he will resume that schedule.  4. Secondary Hyperparathyroidism - he will cont. His Renvela.   5. GERD-she will continue Protonix.  6. COPD-no acute exacerbation. Patient will continue Symbicort, albuterol inhaler as needed.  DISCHARGE CONDITIONS:   Stable  CONSULTS OBTAINED:  Treatment Team:  [name], MD Lavonia Dana, MD  DRUG ALLERGIES:  No Known Allergies  DISCHARGE MEDICATIONS:   Allergies as of 09/16/2016   No Known Allergies     Medication List    STOP taking these medications   hydrOXYzine 25 MG capsule Commonly known as:  VISTARIL     TAKE these medications   albuterol 108 (90 Base) MCG/ACT inhaler Commonly known as:  PROVENTIL HFA;VENTOLIN HFA Inhale 4-6 puffs by mouth every 4 hours as needed for wheezing, cough, and/or shortness of breath   amLODipine 5 MG tablet Commonly known as:  NORVASC Take 5 mg by mouth daily.   aspirin EC 81 MG tablet Take 81 mg by mouth daily.   atorvastatin 40 MG tablet  Commonly known as:  LIPITOR Take 40 mg by mouth daily.   budesonide-formoterol 160-4.5 MCG/ACT inhaler Commonly known as:  SYMBICORT Inhale 2 puffs into the lungs daily.   DIALYVITE SUPREME D 3 MG Tabs Take 1 tablet by mouth daily.   furosemide 80 MG tablet Commonly known as:  LASIX Take 80 mg by mouth daily.   gabapentin 300 MG capsule Commonly known as:   NEURONTIN Take 1 capsule (300 mg total) by mouth daily. What changed:  when to take this   labetalol 100 MG tablet Commonly known as:  NORMODYNE Take 1 tablet (100 mg total) by mouth daily.   nitroGLYCERIN 0.4 MG SL tablet Commonly known as:  NITROSTAT Place 1 tablet (0.4 mg total) under the tongue every 5 (five) minutes as needed.   pantoprazole 40 MG tablet Commonly known as:  PROTONIX Take 1 tablet (40 mg total) by mouth daily.   sevelamer carbonate 800 MG tablet Commonly known as:  RENVELA Take 3 tablets (2,400 mg total) by mouth 3 (three) times daily with meals.         DISCHARGE INSTRUCTIONS:   DIET:  Cardiac diet and Renal diet  DISCHARGE CONDITION:  Stable  ACTIVITY:  Activity as tolerated  OXYGEN:  Home Oxygen: No.   Oxygen Delivery: room air  DISCHARGE LOCATION:  home   If you experience worsening of your admission symptoms, develop shortness of breath, life threatening emergency, suicidal or homicidal thoughts you must seek medical attention immediately by calling 911 or calling your MD immediately  if symptoms less severe.  You Must read complete instructions/literature along with all the possible adverse reactions/side effects for all the Medicines you take and that have been prescribed to you. Take any new Medicines after you have completely understood and accpet all the possible adverse reactions/side effects.   Please note  You were cared for by a hospitalist during your hospital stay. If you have any questions about your discharge medications or the care you received while you were in the hospital after you are discharged, you can call the unit and asked to speak with the hospitalist on call if the hospitalist that took care of you is not available. Once you are discharged, your primary care physician will handle any further medical issues. Please note that NO REFILLS for any discharge medications will be authorized once you are discharged, as it is  imperative that you return to your primary care physician (or establish a relationship with a primary care physician if you do not have one) for your aftercare needs so that they can reassess your need for medications and monitor your lab values.     Today   Hg. Stable post transfusion.  S/p therapeutic paracentesis with 7.3 L of fluid removed.  Will d/c home later today.   VITAL SIGNS:  Blood pressure 125/67, pulse 86, temperature 98.4 F (36.9 C), temperature source Oral, resp. rate 14, height 6\' 3"  (1.905 m), weight 93 kg (205 lb), SpO2 100 %.  I/O:   Intake/Output Summary (Last 24 hours) at 09/16/16 1252 Last data filed at 09/16/16 0926  Gross per 24 hour  Intake              716 ml  Output              350 ml  Net              366 ml    PHYSICAL EXAMINATION:  GENERAL:  56 y.o.-year-old patient lying  in the bed with no acute distress.  EYES: Pupils equal, round, reactive to light and accommodation. No scleral icterus. Extraocular muscles intact.  HEENT: Head atraumatic, normocephalic. Oropharynx and nasopharynx clear.  NECK:  Supple, no jugular venous distention. No thyroid enlargement, no tenderness.  LUNGS: Normal breath sounds bilaterally, no wheezing, rales,rhonchi. No use of accessory muscles of respiration.  CARDIOVASCULAR: S1, S2 normal. No murmurs, rubs, or gallops.  ABDOMEN: Soft, non-tender, distended with + fluid wave. Bowel sounds present. No organomegaly or mass.  EXTREMITIES: No pedal edema, cyanosis, or clubbing.  NEUROLOGIC: Cranial nerves II through XII are intact. No focal motor or sensory defecits b/l.  PSYCHIATRIC: The patient is alert and oriented x 3. SKIN: No obvious rash, lesion, or ulcer.   RUE AV fistula with good bruit/thrill.   DATA REVIEW:   CBC  Recent Labs Lab 09/16/16 0706  WBC 6.5  HGB 7.3*  HCT 23.3*  PLT 228    Chemistries   Recent Labs Lab 09/15/16 1512 09/16/16 0517  NA 136 140  K 4.5 4.9  CL 96* 99*  CO2 29 30   GLUCOSE 128* 131*  BUN 30* 38*  CREATININE 5.54* 7.12*  CALCIUM 8.4* 8.5*  AST 28  --   ALT 16*  --   ALKPHOS 127*  --   BILITOT 0.6  --     Cardiac Enzymes  Recent Labs Lab 09/15/16 1528  TROPONINI 0.06*    Microbiology Results  Results for orders placed or performed during the hospital encounter of 08/09/16  MRSA PCR Screening     Status: None   Collection Time: 08/11/16  6:00 PM  Result Value Ref Range Status   MRSA by PCR NEGATIVE NEGATIVE Final    Comment:        The GeneXpert MRSA Assay (FDA approved for NASAL specimens only), is one component of a comprehensive MRSA colonization surveillance program. It is not intended to diagnose MRSA infection nor to guide or monitor treatment for MRSA infections.     RADIOLOGY:  US Paracentesis  Result Date: 09/16/2016 INDICATION: Patient with recurrent ascites. Request is made for therapeutic paracentesis. EXAM: ULTRASOUND GUIDED THERAPEUTIC PARACENTESIS MEDICATIONS: 10 mL 1% lidocaine COMPLICATIONS: None immediate. PROCEDURE: Informed written consent was obtained from the patient after a discussion of the risks, benefits and alternatives to treatment. A timeout was performed prior to the initiation of the procedure. Initial ultrasound scanning demonstrates a large amount of ascites within the right lateral abdomen. The right lateral abdomen was prepped and draped in the usual sterile fashion. 1% lidocaine was used for local anesthesia. Following this, a Safe-T-Centesis catheter was introduced. An ultrasound image was saved for documentation purposes. The paracentesis was performed. The catheter was removed and a dressing was applied. The patient tolerated the procedure well without immediate post procedural complication. FINDINGS: A total of approximately 7.3 liters of clear, yellow fluid was removed. IMPRESSION: Successful ultrasound-guided therapeutic paracentesis yielding 7.3 liters of peritoneal fluid. Read by:  Brynda Greathouse  PA-C Electronically Signed   By: Aletta Edouard M.D.   On: 09/16/2016 11:09   Dg Chest Portable 1 View  Result Date: 09/15/2016 CLINICAL DATA:  56 year old male with a history of anemia EXAM: PORTABLE CHEST 1 VIEW COMPARISON:  08/16/2016 FINDINGS: Cardiomediastinal silhouette unchanged in size and contour. Surgical changes of median sternotomy and CABG. Calcifications of the aortic arch. No pneumothorax. No pleural effusion. No confluent airspace disease. Coarsened interstitial markings, similar to prior. No displaced fracture. IMPRESSION: Chronic lung changes without  evidence of acute cardiopulmonary disease. Surgical changes of prior median sternotomy and CABG. Electronically Signed   By: Corrie Mckusick D.O.   On: 09/15/2016 15:45      Management plans discussed with the patient, family and they are in agreement.  CODE STATUS:     Code Status Orders        Start     Ordered   09/15/16 1758  Full code  Continuous     09/15/16 1757          TOTAL TIME TAKING CARE OF THIS PATIENT: 40 minutes.    Henreitta Leber M.D on 09/16/2016 at 12:52 PM  Between 7am to 6pm - Pager - [phone]  After 6pm go to [email or web address] - Proofreader  [facility]  Office  [phone]  CC: Primary care physician; [name], MD

## 2016-09-16 NOTE — Progress Notes (Signed)
Patient discharged to home, following administration of albumin. Vital signs stable. Patient resting. Concerns addressed.

## 2016-09-17 LAB — BPAM RBC
Blood Product Expiration Date: 201807122359
Blood Product Expiration Date: 201807122359
Blood Product Expiration Date: 201807192359
ISSUE DATE / TIME: 201807051850
ISSUE DATE / TIME: 201807052335
UNIT TYPE AND RH: 600
UNIT TYPE AND RH: 6200
Unit Type and Rh: 600

## 2016-09-17 LAB — TYPE AND SCREEN
ABO/RH(D): A POS
ANTIBODY SCREEN: NEGATIVE
UNIT DIVISION: 0
UNIT DIVISION: 0
UNIT DIVISION: 0

## 2016-09-20 DIAGNOSIS — Z1159 Encounter for screening for other viral diseases: Secondary | ICD-10-CM | POA: Diagnosis not present

## 2016-09-20 DIAGNOSIS — D649 Anemia, unspecified: Secondary | ICD-10-CM | POA: Diagnosis not present

## 2016-09-20 DIAGNOSIS — K7031 Alcoholic cirrhosis of liver with ascites: Secondary | ICD-10-CM | POA: Diagnosis not present

## 2016-09-20 DIAGNOSIS — I739 Peripheral vascular disease, unspecified: Secondary | ICD-10-CM | POA: Diagnosis not present

## 2016-09-20 DIAGNOSIS — N186 End stage renal disease: Secondary | ICD-10-CM | POA: Diagnosis not present

## 2016-09-20 DIAGNOSIS — D500 Iron deficiency anemia secondary to blood loss (chronic): Secondary | ICD-10-CM | POA: Diagnosis not present

## 2016-09-20 DIAGNOSIS — N2581 Secondary hyperparathyroidism of renal origin: Secondary | ICD-10-CM | POA: Diagnosis not present

## 2016-09-22 DIAGNOSIS — N2581 Secondary hyperparathyroidism of renal origin: Secondary | ICD-10-CM | POA: Diagnosis not present

## 2016-09-22 DIAGNOSIS — N186 End stage renal disease: Secondary | ICD-10-CM | POA: Diagnosis not present

## 2016-09-22 DIAGNOSIS — D649 Anemia, unspecified: Secondary | ICD-10-CM | POA: Diagnosis not present

## 2016-09-24 DIAGNOSIS — D649 Anemia, unspecified: Secondary | ICD-10-CM | POA: Diagnosis not present

## 2016-09-24 DIAGNOSIS — N186 End stage renal disease: Secondary | ICD-10-CM | POA: Diagnosis not present

## 2016-09-24 DIAGNOSIS — N2581 Secondary hyperparathyroidism of renal origin: Secondary | ICD-10-CM | POA: Diagnosis not present

## 2016-09-29 DIAGNOSIS — D649 Anemia, unspecified: Secondary | ICD-10-CM | POA: Diagnosis not present

## 2016-09-29 DIAGNOSIS — N2581 Secondary hyperparathyroidism of renal origin: Secondary | ICD-10-CM | POA: Diagnosis not present

## 2016-09-29 DIAGNOSIS — N186 End stage renal disease: Secondary | ICD-10-CM | POA: Diagnosis not present

## 2016-10-01 DIAGNOSIS — D649 Anemia, unspecified: Secondary | ICD-10-CM | POA: Diagnosis not present

## 2016-10-01 DIAGNOSIS — N186 End stage renal disease: Secondary | ICD-10-CM | POA: Diagnosis not present

## 2016-10-01 DIAGNOSIS — N2581 Secondary hyperparathyroidism of renal origin: Secondary | ICD-10-CM | POA: Diagnosis not present

## 2016-10-04 ENCOUNTER — Other Ambulatory Visit: Payer: Self-pay | Admitting: Nephrology

## 2016-10-04 DIAGNOSIS — N2581 Secondary hyperparathyroidism of renal origin: Secondary | ICD-10-CM | POA: Diagnosis not present

## 2016-10-04 DIAGNOSIS — D649 Anemia, unspecified: Secondary | ICD-10-CM | POA: Diagnosis not present

## 2016-10-04 DIAGNOSIS — R188 Other ascites: Secondary | ICD-10-CM

## 2016-10-04 DIAGNOSIS — N186 End stage renal disease: Secondary | ICD-10-CM | POA: Diagnosis not present

## 2016-10-06 DIAGNOSIS — N2581 Secondary hyperparathyroidism of renal origin: Secondary | ICD-10-CM | POA: Diagnosis not present

## 2016-10-06 DIAGNOSIS — D649 Anemia, unspecified: Secondary | ICD-10-CM | POA: Diagnosis not present

## 2016-10-06 DIAGNOSIS — N186 End stage renal disease: Secondary | ICD-10-CM | POA: Diagnosis not present

## 2016-10-07 ENCOUNTER — Ambulatory Visit
Admission: RE | Admit: 2016-10-07 | Discharge: 2016-10-07 | Disposition: A | Discharge status: Home / Self Care | Payer: Medicare Other | Source: Ambulatory Visit | Attending: Nephrology | Admitting: Nephrology

## 2016-10-07 DIAGNOSIS — R188 Other ascites: Secondary | ICD-10-CM

## 2016-10-08 DIAGNOSIS — N186 End stage renal disease: Secondary | ICD-10-CM | POA: Diagnosis not present

## 2016-10-08 DIAGNOSIS — D649 Anemia, unspecified: Secondary | ICD-10-CM | POA: Diagnosis not present

## 2016-10-08 DIAGNOSIS — N2581 Secondary hyperparathyroidism of renal origin: Secondary | ICD-10-CM | POA: Diagnosis not present

## 2016-10-11 DIAGNOSIS — N2581 Secondary hyperparathyroidism of renal origin: Secondary | ICD-10-CM | POA: Diagnosis not present

## 2016-10-11 DIAGNOSIS — N186 End stage renal disease: Secondary | ICD-10-CM | POA: Diagnosis not present

## 2016-10-11 DIAGNOSIS — Z992 Dependence on renal dialysis: Secondary | ICD-10-CM | POA: Diagnosis not present

## 2016-10-11 DIAGNOSIS — D649 Anemia, unspecified: Secondary | ICD-10-CM | POA: Diagnosis not present

## 2016-10-13 DIAGNOSIS — D649 Anemia, unspecified: Secondary | ICD-10-CM | POA: Diagnosis not present

## 2016-10-13 DIAGNOSIS — N2581 Secondary hyperparathyroidism of renal origin: Secondary | ICD-10-CM | POA: Diagnosis not present

## 2016-10-13 DIAGNOSIS — N186 End stage renal disease: Secondary | ICD-10-CM | POA: Diagnosis not present

## 2016-10-15 DIAGNOSIS — N2581 Secondary hyperparathyroidism of renal origin: Secondary | ICD-10-CM | POA: Diagnosis not present

## 2016-10-15 DIAGNOSIS — N186 End stage renal disease: Secondary | ICD-10-CM | POA: Diagnosis not present

## 2016-10-15 DIAGNOSIS — D649 Anemia, unspecified: Secondary | ICD-10-CM | POA: Diagnosis not present

## 2016-10-18 DIAGNOSIS — N2581 Secondary hyperparathyroidism of renal origin: Secondary | ICD-10-CM | POA: Diagnosis not present

## 2016-10-18 DIAGNOSIS — N186 End stage renal disease: Secondary | ICD-10-CM | POA: Diagnosis not present

## 2016-10-18 DIAGNOSIS — D649 Anemia, unspecified: Secondary | ICD-10-CM | POA: Diagnosis not present

## 2016-10-20 ENCOUNTER — Encounter: Payer: Self-pay | Admitting: Emergency Medicine

## 2016-10-20 ENCOUNTER — Other Ambulatory Visit: Payer: Self-pay

## 2016-10-20 ENCOUNTER — Emergency Department: Payer: Medicare Other

## 2016-10-20 ENCOUNTER — Inpatient Hospital Stay: Payer: Medicare Other

## 2016-10-20 ENCOUNTER — Inpatient Hospital Stay
Admission: EM | Admit: 2016-10-20 | Discharge: 2016-10-21 | DRG: 432 | Discharge status: Against Medical Advice | Payer: Medicare Other | Attending: Internal Medicine | Admitting: Internal Medicine

## 2016-10-20 DIAGNOSIS — Z22322 Carrier or suspected carrier of Methicillin resistant Staphylococcus aureus: Secondary | ICD-10-CM

## 2016-10-20 DIAGNOSIS — R778 Other specified abnormalities of plasma proteins: Secondary | ICD-10-CM

## 2016-10-20 DIAGNOSIS — E785 Hyperlipidemia, unspecified: Secondary | ICD-10-CM | POA: Diagnosis present

## 2016-10-20 DIAGNOSIS — Z841 Family history of disorders of kidney and ureter: Secondary | ICD-10-CM

## 2016-10-20 DIAGNOSIS — Z951 Presence of aortocoronary bypass graft: Secondary | ICD-10-CM | POA: Diagnosis not present

## 2016-10-20 DIAGNOSIS — I2510 Atherosclerotic heart disease of native coronary artery without angina pectoris: Secondary | ICD-10-CM | POA: Diagnosis present

## 2016-10-20 DIAGNOSIS — D631 Anemia in chronic kidney disease: Secondary | ICD-10-CM | POA: Diagnosis present

## 2016-10-20 DIAGNOSIS — R05 Cough: Secondary | ICD-10-CM | POA: Diagnosis not present

## 2016-10-20 DIAGNOSIS — N186 End stage renal disease: Secondary | ICD-10-CM | POA: Diagnosis not present

## 2016-10-20 DIAGNOSIS — K219 Gastro-esophageal reflux disease without esophagitis: Secondary | ICD-10-CM | POA: Diagnosis present

## 2016-10-20 DIAGNOSIS — R140 Abdominal distension (gaseous): Secondary | ICD-10-CM | POA: Diagnosis present

## 2016-10-20 DIAGNOSIS — Z992 Dependence on renal dialysis: Secondary | ICD-10-CM | POA: Diagnosis not present

## 2016-10-20 DIAGNOSIS — N2581 Secondary hyperparathyroidism of renal origin: Secondary | ICD-10-CM | POA: Diagnosis not present

## 2016-10-20 DIAGNOSIS — K7030 Alcoholic cirrhosis of liver without ascites: Principal | ICD-10-CM | POA: Diagnosis present

## 2016-10-20 DIAGNOSIS — R7989 Other specified abnormal findings of blood chemistry: Secondary | ICD-10-CM | POA: Diagnosis present

## 2016-10-20 DIAGNOSIS — R0602 Shortness of breath: Secondary | ICD-10-CM | POA: Diagnosis not present

## 2016-10-20 DIAGNOSIS — K7031 Alcoholic cirrhosis of liver with ascites: Secondary | ICD-10-CM | POA: Diagnosis present

## 2016-10-20 DIAGNOSIS — Z79899 Other long term (current) drug therapy: Secondary | ICD-10-CM | POA: Diagnosis not present

## 2016-10-20 DIAGNOSIS — I10 Essential (primary) hypertension: Secondary | ICD-10-CM | POA: Diagnosis not present

## 2016-10-20 DIAGNOSIS — R0989 Other specified symptoms and signs involving the circulatory and respiratory systems: Secondary | ICD-10-CM

## 2016-10-20 DIAGNOSIS — E1122 Type 2 diabetes mellitus with diabetic chronic kidney disease: Secondary | ICD-10-CM | POA: Diagnosis present

## 2016-10-20 DIAGNOSIS — E1142 Type 2 diabetes mellitus with diabetic polyneuropathy: Secondary | ICD-10-CM | POA: Diagnosis present

## 2016-10-20 DIAGNOSIS — K7460 Unspecified cirrhosis of liver: Secondary | ICD-10-CM | POA: Diagnosis not present

## 2016-10-20 DIAGNOSIS — E1151 Type 2 diabetes mellitus with diabetic peripheral angiopathy without gangrene: Secondary | ICD-10-CM | POA: Diagnosis present

## 2016-10-20 DIAGNOSIS — F17210 Nicotine dependence, cigarettes, uncomplicated: Secondary | ICD-10-CM | POA: Diagnosis present

## 2016-10-20 DIAGNOSIS — Z7982 Long term (current) use of aspirin: Secondary | ICD-10-CM

## 2016-10-20 DIAGNOSIS — I132 Hypertensive heart and chronic kidney disease with heart failure and with stage 5 chronic kidney disease, or end stage renal disease: Secondary | ICD-10-CM | POA: Diagnosis present

## 2016-10-20 DIAGNOSIS — I509 Heart failure, unspecified: Secondary | ICD-10-CM | POA: Diagnosis present

## 2016-10-20 DIAGNOSIS — J441 Chronic obstructive pulmonary disease with (acute) exacerbation: Secondary | ICD-10-CM | POA: Diagnosis not present

## 2016-10-20 DIAGNOSIS — R188 Other ascites: Secondary | ICD-10-CM | POA: Diagnosis present

## 2016-10-20 DIAGNOSIS — Z800 Family history of malignant neoplasm of digestive organs: Secondary | ICD-10-CM | POA: Diagnosis not present

## 2016-10-20 LAB — BODY FLUID CELL COUNT WITH DIFFERENTIAL
EOS FL: 0 %
LYMPHS FL: 45 %
MONOCYTE-MACROPHAGE-SEROUS FLUID: 53 %
NEUTROPHIL FLUID: 2 %
OTHER CELLS FL: 0 %
Total Nucleated Cell Count, Fluid: 74 cu mm

## 2016-10-20 LAB — COMPREHENSIVE METABOLIC PANEL
ALBUMIN: 3.3 g/dL — AB (ref 3.5–5.0)
ALK PHOS: 149 U/L — AB (ref 38–126)
ALT: 23 U/L (ref 17–63)
AST: 26 U/L (ref 15–41)
Anion gap: 15 (ref 5–15)
BILIRUBIN TOTAL: 0.5 mg/dL (ref 0.3–1.2)
BUN: 35 mg/dL — AB (ref 6–20)
CO2: 28 mmol/L (ref 22–32)
Calcium: 9 mg/dL (ref 8.9–10.3)
Chloride: 99 mmol/L — ABNORMAL LOW (ref 101–111)
Creatinine, Ser: 7.47 mg/dL — ABNORMAL HIGH (ref 0.61–1.24)
GFR calc Af Amer: 8 mL/min — ABNORMAL LOW (ref >60)
GFR, EST NON AFRICAN AMERICAN: 7 mL/min — AB (ref >60)
GLUCOSE: 90 mg/dL (ref 65–99)
Potassium: 5.1 mmol/L (ref 3.5–5.1)
Sodium: 142 mmol/L (ref 135–145)
TOTAL PROTEIN: 7.2 g/dL (ref 6.5–8.1)

## 2016-10-20 LAB — CBC WITH DIFFERENTIAL/PLATELET
Basophils Absolute: 0.1 10*3/uL (ref 0–0.1)
Basophils Relative: 1 %
Eosinophils Absolute: 0.1 10*3/uL (ref 0–0.7)
Eosinophils Relative: 1 %
HEMATOCRIT: 30.9 % — AB (ref 40.0–52.0)
HEMOGLOBIN: 9.1 g/dL — AB (ref 13.0–18.0)
LYMPHS ABS: 0.7 10*3/uL — AB (ref 1.0–3.6)
LYMPHS PCT: 12 %
MCH: 24.8 pg — AB (ref 26.0–34.0)
MCHC: 29.3 g/dL — AB (ref 32.0–36.0)
MCV: 84.5 fL (ref 80.0–100.0)
MONO ABS: 0.9 10*3/uL (ref 0.2–1.0)
MONOS PCT: 14 %
NEUTROS ABS: 4.5 10*3/uL (ref 1.4–6.5)
NEUTROS PCT: 72 %
Platelets: 280 10*3/uL (ref 150–440)
RBC: 3.66 MIL/uL — ABNORMAL LOW (ref 4.40–5.90)
RDW: 19.7 % — AB (ref 11.5–14.5)
WBC: 6.3 10*3/uL (ref 3.8–10.6)

## 2016-10-20 LAB — BRAIN NATRIURETIC PEPTIDE: B Natriuretic Peptide: 3568 pg/mL — ABNORMAL HIGH (ref 0.0–100.0)

## 2016-10-20 LAB — PROTEIN, PLEURAL OR PERITONEAL FLUID: Total protein, fluid: 3.6 g/dL

## 2016-10-20 LAB — TROPONIN I: Troponin I: 0.04 ng/mL (ref <0.03)

## 2016-10-20 LAB — MRSA PCR SCREENING: MRSA BY PCR: NEGATIVE

## 2016-10-20 LAB — LIPASE, BLOOD: Lipase: 32 U/L (ref 11–51)

## 2016-10-20 LAB — GLUCOSE, PLEURAL OR PERITONEAL FLUID: GLUCOSE FL: 132 mg/dL

## 2016-10-20 MED ORDER — FUROSEMIDE 40 MG PO TABS
80.0000 mg | ORAL_TABLET | Freq: Every day | ORAL | Status: DC
  Administered 2016-10-21: 80 mg via ORAL
  Filled 2016-10-20: qty 2

## 2016-10-20 MED ORDER — HYDROCODONE-ACETAMINOPHEN 5-325 MG PO TABS
1.0000 | ORAL_TABLET | Freq: Four times a day (QID) | ORAL | Status: DC | PRN
  Administered 2016-10-20 – 2016-10-21 (×4): 1 via ORAL
  Filled 2016-10-20 (×5): qty 1

## 2016-10-20 MED ORDER — NITROGLYCERIN 0.4 MG SL SUBL: 0.4000 mg | SUBLINGUAL_TABLET | SUBLINGUAL | Status: DC | PRN

## 2016-10-20 MED ORDER — ALBUMIN HUMAN 25 % IV SOLN
12.5000 g | Freq: Once | INTRAVENOUS | Status: DC
  Filled 2016-10-20: qty 50

## 2016-10-20 MED ORDER — IPRATROPIUM-ALBUTEROL 0.5-2.5 (3) MG/3ML IN SOLN
3.0000 mL | Freq: Once | RESPIRATORY_TRACT | Status: AC
  Administered 2016-10-20: 3 mL via RESPIRATORY_TRACT
  Filled 2016-10-20: qty 9

## 2016-10-20 MED ORDER — ATORVASTATIN CALCIUM 20 MG PO TABS: 40.0000 mg | ORAL_TABLET | Freq: Every day | ORAL | Status: DC

## 2016-10-20 MED ORDER — SEVELAMER CARBONATE 800 MG PO TABS
2400.0000 mg | ORAL_TABLET | Freq: Three times a day (TID) | ORAL | Status: DC
  Administered 2016-10-20 – 2016-10-21 (×2): 2400 mg via ORAL
  Filled 2016-10-20 (×2): qty 3

## 2016-10-20 MED ORDER — IPRATROPIUM-ALBUTEROL 0.5-2.5 (3) MG/3ML IN SOLN
3.0000 mL | Freq: Once | RESPIRATORY_TRACT | Status: AC
Start: 2016-10-20 — End: 2016-10-20
  Administered 2016-10-20: 3 mL via RESPIRATORY_TRACT

## 2016-10-20 MED ORDER — METHYLPREDNISOLONE SODIUM SUCC 40 MG IJ SOLR
INTRAMUSCULAR | Status: AC
  Filled 2016-10-20: qty 1

## 2016-10-20 MED ORDER — HEPARIN SODIUM (PORCINE) 5000 UNIT/ML IJ SOLN: 5000.0000 [IU] | Freq: Three times a day (TID) | INTRAMUSCULAR | Status: DC

## 2016-10-20 MED ORDER — METHYLPREDNISOLONE SODIUM SUCC 40 MG IJ SOLR
40.0000 mg | Freq: Every day | INTRAMUSCULAR | Status: DC
  Administered 2016-10-20 – 2016-10-21 (×2): 40 mg via INTRAVENOUS
  Filled 2016-10-20 (×2): qty 1

## 2016-10-20 MED ORDER — ALUM & MAG HYDROXIDE-SIMETH 200-200-20 MG/5ML PO SUSP
30.0000 mL | Freq: Four times a day (QID) | ORAL | Status: DC | PRN
  Administered 2016-10-20: 30 mL via ORAL
  Filled 2016-10-20: qty 30

## 2016-10-20 MED ORDER — IPRATROPIUM-ALBUTEROL 0.5-2.5 (3) MG/3ML IN SOLN
3.0000 mL | Freq: Four times a day (QID) | RESPIRATORY_TRACT | Status: DC
  Administered 2016-10-20 – 2016-10-21 (×3): 3 mL via RESPIRATORY_TRACT
  Filled 2016-10-20 (×3): qty 3

## 2016-10-20 MED ORDER — IPRATROPIUM-ALBUTEROL 0.5-2.5 (3) MG/3ML IN SOLN
3.0000 mL | Freq: Once | RESPIRATORY_TRACT | Status: AC
  Administered 2016-10-20: 3 mL via RESPIRATORY_TRACT

## 2016-10-20 MED ORDER — GABAPENTIN 300 MG PO CAPS
300.0000 mg | ORAL_CAPSULE | Freq: Every day | ORAL | Status: DC
  Administered 2016-10-20: 300 mg via ORAL
  Filled 2016-10-20: qty 1

## 2016-10-20 MED ORDER — GI COCKTAIL ~~LOC~~
30.0000 mL | Freq: Once | ORAL | Status: AC
  Administered 2016-10-20: 30 mL via ORAL
  Filled 2016-10-20 (×2): qty 30

## 2016-10-20 MED ORDER — BUDESONIDE 0.5 MG/2ML IN SUSP
0.5000 mg | Freq: Two times a day (BID) | RESPIRATORY_TRACT | Status: DC
  Administered 2016-10-20 – 2016-10-21 (×2): 0.5 mg via RESPIRATORY_TRACT
  Filled 2016-10-20 (×2): qty 2

## 2016-10-20 MED ORDER — LABETALOL HCL 100 MG PO TABS
100.0000 mg | ORAL_TABLET | Freq: Every day | ORAL | Status: DC
  Administered 2016-10-20 – 2016-10-21 (×2): 100 mg via ORAL
  Filled 2016-10-20 (×2): qty 1

## 2016-10-20 MED ORDER — RENA-VITE PO TABS
1.0000 | ORAL_TABLET | Freq: Every day | ORAL | Status: DC
  Administered 2016-10-20 – 2016-10-21 (×2): 1 via ORAL
  Filled 2016-10-20 (×2): qty 1

## 2016-10-20 MED ORDER — ASPIRIN EC 81 MG PO TBEC
81.0000 mg | DELAYED_RELEASE_TABLET | Freq: Every day | ORAL | Status: DC
  Administered 2016-10-20 – 2016-10-21 (×2): 81 mg via ORAL
  Filled 2016-10-20 (×2): qty 1

## 2016-10-20 MED ORDER — PANTOPRAZOLE SODIUM 40 MG PO TBEC: 40.0000 mg | DELAYED_RELEASE_TABLET | Freq: Every day | ORAL | Status: DC

## 2016-10-20 NOTE — ED Notes (Signed)
Pt transported to [name] for paracentesis at this time

## 2016-10-20 NOTE — ED Triage Notes (Signed)
Pt to ED via EMS from dialysis , pt c/o SOB. Pt has obvious abd distention, usually gets paracentesis q2wks but hasn't in 6wks. Pt last dialyzed on Tuesday. Pt A&Ox4

## 2016-10-20 NOTE — ED Notes (Signed)
Pt's blue [facility] dialysis bag, 1 white hat, 1 white tank top, 1 white and blue lunch box taken to patient in [name] at this time. [name] states almost done with procedure, this RN informed had ready bed, [name] to call and inform this RN when patient is done and this RN will transport to room. Pt visualized in NAD at this time.

## 2016-10-20 NOTE — H&P (Addendum)
[facility] [place] NAME: Stanley [name]    MR#:  [id number]  DATE OF BIRTH:  1960/07/15  DATE OF ADMISSION:  10/20/2016  PRIMARY CARE PHYSICIAN: [name], MD   REQUESTING/REFERRING PHYSICIAN: Dr Hinda Kehr  CHIEF COMPLAINT:   Chief Complaint  Patient presents with  . Shortness of Breath    HISTORY OF PRESENT ILLNESS:  Zephyr Ridley  is a 56 y.o. male with a known history of Cirrhosis with ascites, COPD and end-stage renal disease presents with shortness of breath and abdominal distention. He states that the last time he came for paracentesis he was there at the wrong time and the procedure was not done. He states that his abdomen has become more swollen. He has become short of breath. He's fatigued. He's had some weight loss. The other day he had nosebleed.  PAST MEDICAL HISTORY:   Past Medical History:  Diagnosis Date  . Alcohol abuse   . Cirrhosis ([place])   . Coronary artery disease 2009  . Diabetic peripheral neuropathy ([place])   . Drug abuse   . End stage renal disease on dialysis [facility]) NEPHROLOGIST-   DR [facility]  IN [place]   HEMODIALYSIS --   TUES/  THURS/  SAT  . GERD (gastroesophageal reflux disease)   . Hyperlipidemia   . Hypertension   . PAD (peripheral artery disease) ([place])   . Renal insufficiency    Per pt, 32 oz fluid restriction per day  . S/P triple vessel bypass 06/09/2016   2009ish  . Suicidal ideation    & HOMICIDAL IDEATION --  06-16-2013   ADMITTED TO BEHAVIOR HEALTH    PAST SURGICAL HISTORY:   Past Surgical History:  Procedure Laterality Date  . AGILE CAPSULE N/A 06/19/2016   Procedure: AGILE CAPSULE;  Surgeon: [name], MD;  Location: [facility] ENDOSCOPY;  Service: Endoscopy;  Laterality: N/A;  . COLONOSCOPY WITH PROPOFOL N/A 06/18/2016   Procedure: COLONOSCOPY WITH PROPOFOL;  Surgeon: [name], MD;  Location: [facility] ENDOSCOPY;  Service: Endoscopy;  Laterality: N/A;  . COLONOSCOPY  WITH PROPOFOL N/A 08/12/2016   Procedure: COLONOSCOPY WITH PROPOFOL;  Surgeon: [name], MD;  Location: [facility] ENDOSCOPY;  Service: Endoscopy;  Laterality: N/A;  . CORONARY ANGIOPLASTY  ?   PT UNABLE TO TELL IF  BEFORE OR AFTER  CABG  . CORONARY ARTERY BYPASS GRAFT  2008  ([place] , [place])   3 VESSEL  . DIALYSIS FISTULA CREATION  LAST SURGERY  APPOX  2008  . ENTEROSCOPY N/A 05/10/2016   Procedure: ENTEROSCOPY;  Surgeon: Jerene Bears, MD;  Location: [place];  Service: Gastroenterology;  Laterality: N/A;  . ENTEROSCOPY N/A 08/12/2016   Procedure: ENTEROSCOPY;  Surgeon: [name], MD;  Location: [facility] ENDOSCOPY;  Service: Endoscopy;  Laterality: N/A;  . ESOPHAGOGASTRODUODENOSCOPY N/A 05/07/2015   Procedure: ESOPHAGOGASTRODUODENOSCOPY (EGD);  Surgeon: Hulen Luster, MD;  Location: [facility] ENDOSCOPY;  Service: Endoscopy;  Laterality: N/A;  . ESOPHAGOGASTRODUODENOSCOPY (EGD) WITH PROPOFOL N/A 05/17/2015   Procedure: ESOPHAGOGASTRODUODENOSCOPY (EGD) WITH PROPOFOL;  Surgeon: [name], MD;  Location: [facility] ENDOSCOPY;  Service: Endoscopy;  Laterality: N/A;  . ESOPHAGOGASTRODUODENOSCOPY (EGD) WITH PROPOFOL N/A 01/20/2016   Procedure: ESOPHAGOGASTRODUODENOSCOPY (EGD) WITH PROPOFOL;  Surgeon: [name], MD;  Location: [facility] ENDOSCOPY;  Service: Endoscopy;  Laterality: N/A;  . ESOPHAGOGASTRODUODENOSCOPY (EGD) WITH PROPOFOL N/A 04/17/2016   Procedure: ESOPHAGOGASTRODUODENOSCOPY (EGD) WITH PROPOFOL;  Surgeon: Lin Landsman, MD;  Location: [facility] ENDOSCOPY;  Service: Gastroenterology;  Laterality: N/A;  . ESOPHAGOGASTRODUODENOSCOPY (  EGD) WITH PROPOFOL  05/09/2016   Procedure: ESOPHAGOGASTRODUODENOSCOPY (EGD) WITH PROPOFOL;  Surgeon: Jerene Bears, MD;  Location: [place];  Service: Endoscopy;;  . ESOPHAGOGASTRODUODENOSCOPY (EGD) WITH PROPOFOL N/A 06/16/2016   Procedure: ESOPHAGOGASTRODUODENOSCOPY (EGD) WITH PROPOFOL;  Surgeon: [name], MD;  Location: [facility] ENDOSCOPY;  Service: Endoscopy;  Laterality: N/A;  .  GIVENS CAPSULE STUDY N/A 05/07/2016   Procedure: GIVENS CAPSULE STUDY;  Surgeon: Doran Stabler, MD;  Location: [place];  Service: Endoscopy;  Laterality: N/A;  . MANDIBULAR HARDWARE REMOVAL N/A 07/29/2013   Procedure: REMOVAL OF ARCH BARS;  Surgeon: Theodoro Kos, DO;  Location: [place];  Service: Plastics;  Laterality: N/A;  . ORIF MANDIBULAR FRACTURE N/A 06/05/2013   Procedure: REPAIR OF MANDIBULAR FRACTURE x 2 with maxillo-mandibular fixation ;  Surgeon: Theodoro Kos, DO;  Location: [place];  Service: Plastics;  Laterality: N/A;  . PERIPHERAL ARTERIAL STENT GRAFT Left     SOCIAL HISTORY:   Social History  Substance Use Topics  . Smoking status: Current Every Day Smoker    Packs/day: 0.15    Years: 40.00    Types: Cigarettes  . Smokeless tobacco: Never Used  . Alcohol use No     Comment: pt reports quitting after learning about cirrhosis    FAMILY HISTORY:   Family History  Problem Relation Age of Onset  . Colon cancer Mother   . Cancer Father   . Cancer Sister   . Kidney disease Brother     DRUG ALLERGIES:  No Known Allergies  REVIEW OF SYSTEMS:  CONSTITUTIONAL: No fever. Positive for chills and sweats. Positive for weight loss. Positive for fatigue.  EYES: Decreased vision. EARS, NOSE, AND THROAT: No tinnitus or ear pain. No sore throat. Recent epistaxis RESPIRATORY: No cough, positive for shortness of breath. Positive for wheezing.  CARDIOVASCULAR: No chest pain, orthopnea, edema.  GASTROINTESTINAL: No nausea, vomiting, diarrhea. Tightness in abdominal pain. No blood in bowel movements GENITOURINARY: No dysuria, hematuria.  ENDOCRINE: No polyuria, nocturia,  HEMATOLOGY: No anemia, easy bruising or bleeding SKIN: No rash or lesion. MUSCULOSKELETAL: No joint pain or arthritis.   NEUROLOGIC: No tingling, numbness, weakness.  PSYCHIATRY: Positive for depression.   MEDICATIONS AT HOME:   Prior to Admission medications   Medication Sig Start  Date End Date Taking? Authorizing Provider  aspirin EC 81 MG tablet Take 81 mg by mouth daily.   Yes [provider]  atorvastatin (LIPITOR) 40 MG tablet Take 40 mg by mouth daily. 04/19/16  Yes [provider]  budesonide-formoterol (SYMBICORT) 160-4.5 MCG/ACT inhaler Inhale 2 puffs into the lungs daily.   Yes [provider]  furosemide (LASIX) 80 MG tablet Take 80 mg by mouth daily. 05/11/16  Yes [provider]  gabapentin (NEURONTIN) 300 MG capsule Take 1 capsule (300 mg total) by mouth daily. Patient taking differently: Take 300 mg by mouth at bedtime.  03/27/15  Yes Mody, Ulice Bold, MD  labetalol (NORMODYNE) 100 MG tablet Take 1 tablet (100 mg total) by mouth daily. 01/22/16  Yes Epifanio Lesches, MD  Multiple Vitamins-Minerals-FA (DIALYVITE SUPREME D) 3 MG TABS Take 1 tablet by mouth daily. 05/27/16  Yes [provider]  pantoprazole (PROTONIX) 40 MG tablet Take 1 tablet (40 mg total) by mouth daily. 05/10/16  Yes Ghimire, Henreitta Leber, MD  sevelamer carbonate (RENVELA) 800 MG tablet Take 3 tablets (2,400 mg total) by mouth 3 (three) times daily with meals. 06/01/16  Yes Theodoro Grist, MD  albuterol (PROVENTIL HFA;VENTOLIN HFA)  108 (90 Base) MCG/ACT inhaler Inhale 4-6 puffs by mouth every 4 hours as needed for wheezing, cough, and/or shortness of breath 07/11/16   Hinda Kehr, MD  nitroGLYCERIN (NITROSTAT) 0.4 MG SL tablet Place 1 tablet (0.4 mg total) under the tongue every 5 (five) minutes as needed. 04/12/16   Wende Bushy, MD      VITAL SIGNS:  Blood pressure 129/90, pulse 84, temperature 98.1 F (36.7 C), temperature source Oral, resp. rate 20, height 6\' 3"  (1.905 m), weight 81.6 kg (180 lb), SpO2 100 %.  PHYSICAL EXAMINATION:  GENERAL:  56 y.o.-year-old patient lying in the bed with no acute distress.  EYES: Pupils equal, round, reactive to light and accommodation. No scleral icterus. Extraocular muscles intact.  HEENT: Head atraumatic,  normocephalic. Oropharynx and nasopharynx clear.  NECK:  Supple, no jugular venous distention. No thyroid enlargement, no tenderness.  LUNGS: Decreased breath sounds bilaterally, positive wheezing entire lung field. no  rales,rhonchi or crepitation. No use of accessory muscles of respiration.  CARDIOVASCULAR: S1, S2 normal. No murmurs, rubs, or gallops.  ABDOMEN: Soft, nontender, nondistended. Bowel sounds present. No organomegaly or mass.  EXTREMITIES:  2+ edema,  no cyanosis, or clubbing.  NEUROLOGIC: Cranial nerves II through XII are intact. Muscle strength 5/5 in all extremities. Sensation intact. Gait not checked.  PSYCHIATRIC: The patient is alert and oriented x 3.  SKIN: No rash, lesion, or ulcer.   LABORATORY PANEL:   CBC  Recent Labs Lab 10/20/16 1153  WBC 6.3  HGB 9.1*  HCT 30.9*  PLT 280   ------------------------------------------------------------------------------------------------------------------  Chemistries   Recent Labs Lab 10/20/16 1153  NA 142  K 5.1  CL 99*  CO2 28  GLUCOSE 90  BUN 35*  CREATININE 7.47*  CALCIUM 9.0  AST 26  ALT 23  ALKPHOS 149*  BILITOT 0.5   ------------------------------------------------------------------------------------------------------------------  Cardiac Enzymes  Recent Labs Lab 10/20/16 1153  TROPONINI 0.04*   ------------------------------------------------------------------------------------------------------------------  RADIOLOGY:  Dg Chest 2 View  Result Date: 10/20/2016 CLINICAL DATA:  Cough and shortness of breath EXAM: CHEST  2 VIEW COMPARISON:  09/15/2016 FINDINGS: Chronic cardiomegaly. Patient is status post CABG. Stable mediastinal contours. Pulmonary vascular congestion without Kerley lines or effusion. Cephalized blood flow. Chronic interstitial coarsening from emphysema. No asymmetric opacity or air bronchogram. No acute osseous finding. IMPRESSION: 1. Cardiomegaly and vascular congestion. 2.  Chronic interstitial coarsening from emphysema. Electronically Signed   By: Monte Fantasia M.D.   On: 10/20/2016 13:56    EKG:   Sinus rhythm 83 bpm, VPC  IMPRESSION AND PLAN:   1. COPD exacerbation. Start IV Solu-Medrol, budesonide and DuoNeb nebulizer solution. 2. Ascites and history of liver cirrhosis. Set up paracentesis for tomorrow morning with albumin infusion. Laboratory data ordered. 3. End-stage renal disease and fluid overload. Due for dialysis today.  Case discussed with nephrology Dr. Holley Raring to see the patient and dialyze today. Would rather not do paracentesis and dialysis on the same day so we will set up paracentesis for tomorrow. 4. Essential hypertension on labetalol 5. Hyperlipidemia unspecified on atorvastatin 6. Neuropathy on gabapentin 7. GERD on Protonix 8. Anemia of chronic disease. Procrit with dialysis. 9. Tobacco abuse counseling. Smoking cessation counseling done 4 minutes by me. No need for nicotine patch.  All the records are reviewed and case discussed with ED provider. Management plans discussed with the patient, family and they are in agreement.  CODE STATUS: Full code  TOTAL TIME TAKING CARE OF THIS PATIENT: 50  minutes.  Loletha Grayer M.D on 10/20/2016 at 2:22 PM  Between 7am to 6pm - Pager - [phone]  After 6pm call admission pager [phone]  [facility]  [phone]  CC: Primary care physician; [name], MD

## 2016-10-20 NOTE — ED Provider Notes (Addendum)
[facility] Emergency Department Provider Note  ____________________________________________   First MD Initiated Contact with Patient 10/20/16 1156     (approximate)  I have reviewed the triage vital signs and the nursing notes.   HISTORY  Chief Complaint Shortness of Breath    HPI Stanley [name] is a 56 y.o. male With extensive chronic medical issues including cirrhosis secondary to alcohol abuse with recurrent ascites requiring large volume paracentesis,  end-stage renal disease on dialysis (Tuesdays, Thursdays, and Saturdays),and COPD with ongoing tobacco use.  He presents by EMS today for shortness of breath.  Reportedly he went to dialysis but they sent him here due to his shortness of breath and his severe abdominal distention/ascites.  He reports that he is supposed to have a paracentesis every 2 weeks but that it has been about 6 weeks.  He states that "they" messed up the scheduling and has not gotten back to him  He states that Dr. Juleen China is his nephrologist and is the one who was supposed to set up the paracentesis but he does not know how to contact Dr. Juleen China.  As a result he has just been continuing on with dialysis and not getting the paracentesis.  He states that the shortness of breath has been gradually getting worse particularly over the last week and is now severe.  He is also having some wheezing.  He has intermittent sharp and moderate chest pain that he thinks is due to the swelling in his abdomen and the shortness of breath.  He denies fever/chills and denies abdominal pain other than the discomfort from the swelling. denies nausea and vomiting.  he did not have dialysis today.  Past Medical History:  Diagnosis Date  . Alcohol abuse   . Cirrhosis ([place])   . Coronary artery disease 2009  . Diabetic peripheral neuropathy ([place])   . Drug abuse   . End stage renal disease on dialysis [facility]) NEPHROLOGIST-   DR [facility] [address]  [address] [place]   HEMODIALYSIS --   TUES/  THURS/  SAT  . GERD (gastroesophageal reflux disease)   . Hyperlipidemia   . Hypertension   . PAD (peripheral artery disease) ([place])   . Renal insufficiency    Per pt, 32 oz fluid restriction per day  . S/P triple vessel bypass 06/09/2016   2009ish  . Suicidal ideation    & HOMICIDAL IDEATION --  06-16-2013   ADMITTED TO BEHAVIOR HEALTH    Patient Active Problem List   Diagnosis Date Noted  . Anemia   . Heme positive stool   . Ulceration of intestine   . Benign neoplasm of transverse colon   . Acute gastrointestinal hemorrhage   . Esophageal candidiasis ([place])   . Angiodysplasia of intestinal tract   . Acute respiratory failure with hypoxia ([place]) 07/03/2016  . GI bleeding 06/24/2016  . Rectal bleeding 06/14/2016  . Anemia of chronic disease 06/01/2016  . MRSA carrier 06/01/2016  . Chronic renal failure 05/23/2016  . Ischemic heart disease 05/23/2016  . Angiodysplasia of small intestine   . Melena   . Small bowel bleed not requiring more than 4 units of blood in 24 hours, ICU, or surgery   . Anemia due to chronic blood loss   . Abdominal pain 05/05/2016  . Acute posthemorrhagic anemia 04/17/2016  . Gastrointestinal bleed 04/17/2016  . History of esophagogastroduodenoscopy (EGD) 04/17/2016  . Elevated troponin 04/17/2016  . Alcohol abuse 04/17/2016  . Upper GI bleed 01/19/2016  . Blood  in stool   . Angiodysplasia of stomach and duodenum with hemorrhage   . Gastritis   . Esophagitis, unspecified   . GI bleed 05/16/2015  . Acute GI bleeding   . Symptomatic anemia 04/30/2015  . HTN (hypertension) 04/06/2015  . GERD (gastroesophageal reflux disease) 04/06/2015  . HLD (hyperlipidemia) 04/06/2015  . Dyspnea 04/06/2015  . Cirrhosis of liver with ascites ([place]) 04/06/2015  . Ascites 04/06/2015  . GIB (gastrointestinal bleeding) 03/23/2015  . Homicidal ideation 06/19/2013  . Suicidal intent 06/19/2013  . Homicidal ideations 06/19/2013  .  Hyperkalemia 06/16/2013  . Mandible fracture ([place]) 06/05/2013  . Fracture, mandible ([place]) 06/02/2013  . Coronary atherosclerosis of native coronary artery 06/02/2013  . ESRD on dialysis ([place]) 06/02/2013  . Mandible open fracture ([place]) 06/02/2013    Past Surgical History:  Procedure Laterality Date  . AGILE CAPSULE N/A 06/19/2016   Procedure: AGILE CAPSULE;  Surgeon: [name], MD;  Location: [facility] ENDOSCOPY;  Service: Endoscopy;  Laterality: N/A;  . COLONOSCOPY WITH PROPOFOL N/A 06/18/2016   Procedure: COLONOSCOPY WITH PROPOFOL;  Surgeon: [name], MD;  Location: [facility] ENDOSCOPY;  Service: Endoscopy;  Laterality: N/A;  . COLONOSCOPY WITH PROPOFOL N/A 08/12/2016   Procedure: COLONOSCOPY WITH PROPOFOL;  Surgeon: [name], MD;  Location: [facility] ENDOSCOPY;  Service: Endoscopy;  Laterality: N/A;  . CORONARY ANGIOPLASTY  ?   PT UNABLE TO TELL IF  BEFORE OR AFTER  CABG  . CORONARY ARTERY BYPASS GRAFT  2008  ([place] , [place])   3 VESSEL  . DIALYSIS FISTULA CREATION  LAST SURGERY  APPOX  2008  . ENTEROSCOPY N/A 05/10/2016   Procedure: ENTEROSCOPY;  Surgeon: Jerene Bears, MD;  Location: [place];  Service: Gastroenterology;  Laterality: N/A;  . ENTEROSCOPY N/A 08/12/2016   Procedure: ENTEROSCOPY;  Surgeon: [name], MD;  Location: [facility] ENDOSCOPY;  Service: Endoscopy;  Laterality: N/A;  . ESOPHAGOGASTRODUODENOSCOPY N/A 05/07/2015   Procedure: ESOPHAGOGASTRODUODENOSCOPY (EGD);  Surgeon: Hulen Luster, MD;  Location: [facility] ENDOSCOPY;  Service: Endoscopy;  Laterality: N/A;  . ESOPHAGOGASTRODUODENOSCOPY (EGD) WITH PROPOFOL N/A 05/17/2015   Procedure: ESOPHAGOGASTRODUODENOSCOPY (EGD) WITH PROPOFOL;  Surgeon: [name], MD;  Location: [facility] ENDOSCOPY;  Service: Endoscopy;  Laterality: N/A;  . ESOPHAGOGASTRODUODENOSCOPY (EGD) WITH PROPOFOL N/A 01/20/2016   Procedure: ESOPHAGOGASTRODUODENOSCOPY (EGD) WITH PROPOFOL;  Surgeon: [name], MD;  Location: [facility] ENDOSCOPY;  Service: Endoscopy;  Laterality: N/A;  .  ESOPHAGOGASTRODUODENOSCOPY (EGD) WITH PROPOFOL N/A 04/17/2016   Procedure: ESOPHAGOGASTRODUODENOSCOPY (EGD) WITH PROPOFOL;  Surgeon: Lin Landsman, MD;  Location: [facility] ENDOSCOPY;  Service: Gastroenterology;  Laterality: N/A;  . ESOPHAGOGASTRODUODENOSCOPY (EGD) WITH PROPOFOL  05/09/2016   Procedure: ESOPHAGOGASTRODUODENOSCOPY (EGD) WITH PROPOFOL;  Surgeon: Jerene Bears, MD;  Location: [place];  Service: Endoscopy;;  . ESOPHAGOGASTRODUODENOSCOPY (EGD) WITH PROPOFOL N/A 06/16/2016   Procedure: ESOPHAGOGASTRODUODENOSCOPY (EGD) WITH PROPOFOL;  Surgeon: [name], MD;  Location: [facility] ENDOSCOPY;  Service: Endoscopy;  Laterality: N/A;  . GIVENS CAPSULE STUDY N/A 05/07/2016   Procedure: GIVENS CAPSULE STUDY;  Surgeon: Doran Stabler, MD;  Location: [place];  Service: Endoscopy;  Laterality: N/A;  . MANDIBULAR HARDWARE REMOVAL N/A 07/29/2013   Procedure: REMOVAL OF ARCH BARS;  Surgeon: Theodoro Kos, DO;  Location: [place];  Service: Plastics;  Laterality: N/A;  . ORIF MANDIBULAR FRACTURE N/A 06/05/2013   Procedure: REPAIR OF MANDIBULAR FRACTURE x 2 with maxillo-mandibular fixation ;  Surgeon: Theodoro Kos, DO;  Location: [place];  Service: Plastics;  Laterality: N/A;  . PERIPHERAL ARTERIAL STENT GRAFT Left  Prior to Admission medications   Medication Sig Start Date End Date Taking? Authorizing Provider  aspirin EC 81 MG tablet Take 81 mg by mouth daily.   Yes [provider]  atorvastatin (LIPITOR) 40 MG tablet Take 40 mg by mouth daily. 04/19/16  Yes [provider]  budesonide-formoterol (SYMBICORT) 160-4.5 MCG/ACT inhaler Inhale 2 puffs into the lungs daily.   Yes [provider]  furosemide (LASIX) 80 MG tablet Take 80 mg by mouth daily. 05/11/16  Yes [provider]  gabapentin (NEURONTIN) 300 MG capsule Take 1 capsule (300 mg total) by mouth daily. Patient taking differently: Take 300 mg by mouth at bedtime.  03/27/15  Yes Mody,  Ulice Bold, MD  labetalol (NORMODYNE) 100 MG tablet Take 1 tablet (100 mg total) by mouth daily. 01/22/16  Yes Epifanio Lesches, MD  Multiple Vitamins-Minerals-FA (DIALYVITE SUPREME D) 3 MG TABS Take 1 tablet by mouth daily. 05/27/16  Yes [provider]  pantoprazole (PROTONIX) 40 MG tablet Take 1 tablet (40 mg total) by mouth daily. 05/10/16  Yes Ghimire, Henreitta Leber, MD  sevelamer carbonate (RENVELA) 800 MG tablet Take 3 tablets (2,400 mg total) by mouth 3 (three) times daily with meals. 06/01/16  Yes Theodoro Grist, MD  albuterol (PROVENTIL HFA;VENTOLIN HFA) 108 (90 Base) MCG/ACT inhaler Inhale 4-6 puffs by mouth every 4 hours as needed for wheezing, cough, and/or shortness of breath 07/11/16   Hinda Kehr, MD  nitroGLYCERIN (NITROSTAT) 0.4 MG SL tablet Place 1 tablet (0.4 mg total) under the tongue every 5 (five) minutes as needed. 04/12/16   Wende Bushy, MD    Allergies Patient has no known allergies.  Family History  Problem Relation Age of Onset  . Colon cancer Mother   . Cancer Father   . Cancer Sister   . Kidney disease Brother     Social History Social History  Substance Use Topics  . Smoking status: Current Every Day Smoker    Packs/day: 0.15    Years: 40.00    Types: Cigarettes  . Smokeless tobacco: Never Used  . Alcohol use No     Comment: pt reports quitting after learning about cirrhosis    Review of Systems Constitutional: No fever/chills Eyes: No visual changes. ENT: No sore throat. Cardiovascular: Denies chest pain. Respiratory: gradually worsening shortness of breath with some wheezing but also likely due to the swelling in his abdomen. frequent cough Gastrointestinal: gradually worsening discomfort and swelling in his abdomen consistent with large volume ascites.  No nausea, no vomiting.  No diarrhea.  No constipation. Genitourinary: Negative for dysuria. Musculoskeletal: Negative for neck pain.  Negative for back pain. Integumentary: Negative for  rash. Neurological: Negative for headaches, focal weakness or numbness.   ____________________________________________   PHYSICAL EXAM:  VITAL SIGNS: ED Triage Vitals [10/20/16 1149]  Enc Vitals Group     BP 129/90     Pulse Rate 84     Resp 20     Temp 98.1 F (36.7 C)     Temp Source Oral     SpO2 100 %     Weight 81.6 kg (180 lb)     Height 1.905 m (6\' 3" )     Head Circumference      Peak Flow      Pain Score      Pain Loc      Pain Edu?      Excl. in [place]?     Constitutional: Alert and oriented. Appears to have chronic illness, and has  mild respiratory distress and frequent cough at this time. Eyes: Conjunctivae are normal.  Head: Atraumatic. Nose: No congestion/rhinnorhea. Mouth/Throat: Mucous membranes are moist. Neck: No stridor.  No meningeal signs.   Cardiovascular: Normal rate, regular rhythm. Good peripheral circulation. Grossly normal heart sounds. Respiratory: Slightly increased respiratory effort.  Mild expiratory wheezing throughout lung fields Gastrointestinal: Severe distention/swelling consistent with ascites.  No tenderness to palpation. Musculoskeletal: No lower extremity tenderness nor edema. No gross deformities of extremities. Neurologic:  Normal speech and language. No gross focal neurologic deficits are appreciated.  Skin:  Skin is warm, dry and intact. No rash noted.   ____________________________________________   LABS (all labs ordered are listed, but only abnormal results are displayed)  Labs Reviewed  TROPONIN I - Abnormal; Notable for the following:       Result Value   Troponin I 0.04 (*)    All other components within normal limits  CBC WITH DIFFERENTIAL/PLATELET - Abnormal; Notable for the following:    RBC 3.66 (*)    Hemoglobin 9.1 (*)    HCT 30.9 (*)    MCH 24.8 (*)    MCHC 29.3 (*)    RDW 19.7 (*)    Lymphs Abs 0.7 (*)    All other components within normal limits  COMPREHENSIVE METABOLIC PANEL - Abnormal; Notable for  the following:    Chloride 99 (*)    BUN 35 (*)    Creatinine, Ser 7.47 (*)    Albumin 3.3 (*)    Alkaline Phosphatase 149 (*)    GFR calc non Af Amer 7 (*)    GFR calc Af Amer 8 (*)    All other components within normal limits  BRAIN NATRIURETIC PEPTIDE - Abnormal; Notable for the following:    B Natriuretic Peptide 3,568.0 (*)    All other components within normal limits  LIPASE, BLOOD   ____________________________________________  EKG  ED ECG REPORT I, Audryana Hockenberry, the attending physician, personally viewed and interpreted this ECG.  Date: 10/20/2016 EKG Time: 11:49 Rate: 83 Rhythm: normal sinus rhythm with occasional PVC QRS Axis: normal Intervals: normal ST/T Wave abnormalities: Non-specific ST segment / T-wave changes, but no evidence of acute ischemia. Narrative Interpretation: unremarkable  ____________________________________________  RADIOLOGY   Dg Chest 2 View  Result Date: 10/20/2016 CLINICAL DATA:  Cough and shortness of breath EXAM: CHEST  2 VIEW COMPARISON:  09/15/2016 FINDINGS: Chronic cardiomegaly. Patient is status post CABG. Stable mediastinal contours. Pulmonary vascular congestion without Kerley lines or effusion. Cephalized blood flow. Chronic interstitial coarsening from emphysema. No asymmetric opacity or air bronchogram. No acute osseous finding. IMPRESSION: 1. Cardiomegaly and vascular congestion. 2. Chronic interstitial coarsening from emphysema. Electronically Signed   By: Monte Fantasia M.D.   On: 10/20/2016 13:56    ____________________________________________   PROCEDURES  Critical Care performed: No   Procedure(s) performed:   Procedures   ____________________________________________   INITIAL IMPRESSION / ASSESSMENT AND PLAN / ED COURSE  Pertinent labs & imaging results that were available during my care of the patient were reviewed by me and considered in my medical decision making (see chart for details).  the patient  has missed dialysis today and has not had a paracentesis for about 6 weeks.  I reviewed the EMR and saw that on his last hospitalization he had acute on chronic anemia with a hemoglobin of 5.4 and required a blood transfusion as well as dialysis and a large volume paracentesis which withdrew more than 7 L.  Given his multiple  chronic issues and the fact that he is frequently lost to follow-up as an outpatient I suspect he will require inpatient treatment for both the paracentesis and dialysis (as well as his ongoing intermittent chest pain) but I will reassess after name back his labs and determine if he requires emergent treatment for hyperkalemia.  He has no signs or symptoms of SBP and I will not put him at risk of infection or other complications of bedside paracentesis to obtain a diagnostic sample.  I will treat him with DuoNeb's for the mild wheezing and I am awaiting lab results before talking with the hospitalist.   Clinical Course as of Oct 20 1398  Thu Oct 20, 2016  1313 BNP elevated at nearly 3600, although he does appear previously elevated in the 2000-2500 range.  CXR still pending.  Paged Dr. Laurence Ferrari with interventional radiology about 30 minutes ago to discuss the possibility of getting paracentesis today, but have not yet heard back from him.  Discussed briefly with hospitalist with plans for admission, but they are delayed due to numerous patients to admit.  [CF]  5885 Dr. Laurence Ferrari called back and we discussed the patient.  I passed along to Dr. Leslye Peer that the paracentesis may be able to happen today, but if not, tomorrow morning at the latest, so the hospitalist can go ahead and put in the orders.  Patient updated.  [CF]    Clinical Course User Index [CF] Hinda Kehr, MD    ____________________________________________  FINAL CLINICAL IMPRESSION(S) / ED DIAGNOSES  Final diagnoses:  SOB (shortness of breath)  Acute on chronic heart failure, unspecified heart failure type  ([place])  Elevated troponin I level  Ascites due to alcoholic cirrhosis (HCC)  ESRD (end stage renal disease) on dialysis (HCC)  COPD exacerbation (HCC)  Pulmonary vascular congestion     MEDICATIONS GIVEN DURING THIS VISIT:  Medications  ipratropium-albuterol (DUONEB) 0.5-2.5 (3) MG/3ML nebulizer solution 3 mL (3 mLs Nebulization Given 10/20/16 1242)  ipratropium-albuterol (DUONEB) 0.5-2.5 (3) MG/3ML nebulizer solution 3 mL (3 mLs Nebulization Given 10/20/16 1242)  ipratropium-albuterol (DUONEB) 0.5-2.5 (3) MG/3ML nebulizer solution 3 mL (3 mLs Nebulization Given 10/20/16 1242)     NEW OUTPATIENT MEDICATIONS STARTED DURING THIS VISIT:  New Prescriptions   No medications on file    Modified Medications   No medications on file    Discontinued Medications   AMLODIPINE (NORVASC) 5 MG TABLET    Take 5 mg by mouth daily.     Note:  This document was prepared using Dragon voice recognition software and may include unintentional dictation errors.    Hinda Kehr, MD 10/20/16 1339    Hinda Kehr, MD 10/20/16 1400

## 2016-10-20 NOTE — Progress Notes (Signed)
[facility]  ROUNDING NOTE   Subjective:  Patient well-known to [name].   he came here today with increasing shortness of breath, abdominal distention Patient has known ascites which appears to be increased. He also missed his regularly scheduled dialysis today. He will be having paracentesis today therefore we will plan for dialysis tomorrow so as to avoid large amounts of fluid removal in one day.   Objective:  Vital signs in last 24 hours:  Temp:  [98.1 F (36.7 C)] 98.1 F (36.7 C) (08/09 1149) Pulse Rate:  [84-93] 90 (08/09 1551) Resp:  [16-20] 18 (08/09 1551) BP: (125-142)/(67-92) 128/68 (08/09 1551) SpO2:  [99 %-100 %] 99 % (08/09 1551) Weight:  [81.6 kg (180 lb)] 81.6 kg (180 lb) (08/09 1149)  Weight change:  Filed Weights   10/20/16 1149  Weight: 81.6 kg (180 lb)    Intake/Output: No intake/output data recorded.   Intake/Output this shift:  No intake/output data recorded.  Physical Exam: General: No acute distress  Head: Normocephalic, atraumatic. Moist oral mucosal membranes  Eyes: Anicteric  Neck: Supple, trachea midline  Lungs:  Clear to auscultation, normal effort  Heart: S1S2 no rubs  Abdomen:  Soft, distended, BS present  Extremities: 1+ peripheral edema.  Neurologic: Awake, alert, following commands  Skin: No lesions  Access: LUE AVF    Basic Metabolic Panel:  Recent Labs Lab 10/20/16 1153  NA 142  K 5.1  CL 99*  CO2 28  GLUCOSE 90  BUN 35*  CREATININE 7.47*  CALCIUM 9.0    Liver Function Tests:  Recent Labs Lab 10/20/16 1153  AST 26  ALT 23  ALKPHOS 149*  BILITOT 0.5  PROT 7.2  ALBUMIN 3.3*    Recent Labs Lab 10/20/16 1153  LIPASE 32   No results for input(s): AMMONIA in the last 168 hours.  CBC:  Recent Labs Lab 10/20/16 1153  WBC 6.3  NEUTROABS 4.5  HGB 9.1*  HCT 30.9*  MCV 84.5  PLT 280    Cardiac Enzymes:  Recent Labs Lab 10/20/16 1153  TROPONINI 0.04*    BNP: Invalid input(s):  POCBNP  CBG: No results for input(s): GLUCAP in the last 168 hours.  Microbiology: Results for orders placed or performed during the hospital encounter of 08/09/16  MRSA PCR Screening     Status: None   Collection Time: 08/11/16  6:00 PM  Result Value Ref Range Status   MRSA by PCR NEGATIVE NEGATIVE Final    Comment:        The GeneXpert MRSA Assay (FDA approved for NASAL specimens only), is one component of a comprehensive MRSA colonization surveillance program. It is not intended to diagnose MRSA infection nor to guide or monitor treatment for MRSA infections.     Coagulation Studies: No results for input(s): LABPROT, INR in the last 72 hours.  Urinalysis: No results for input(s): COLORURINE, LABSPEC, PHURINE, GLUCOSEU, HGBUR, BILIRUBINUR, KETONESUR, PROTEINUR, UROBILINOGEN, NITRITE, LEUKOCYTESUR in the last 72 hours.  Invalid input(s): APPERANCEUR    Imaging: Dg Chest 2 View  Result Date: 10/20/2016 CLINICAL DATA:  Cough and shortness of breath EXAM: CHEST  2 VIEW COMPARISON:  09/15/2016 FINDINGS: Chronic cardiomegaly. Patient is status post CABG. Stable mediastinal contours. Pulmonary vascular congestion without Kerley lines or effusion. Cephalized blood flow. Chronic interstitial coarsening from emphysema. No asymmetric opacity or air bronchogram. No acute osseous finding. IMPRESSION: 1. Cardiomegaly and vascular congestion. 2. Chronic interstitial coarsening from emphysema. Electronically Signed   By: Neva Seat.D.  On: 10/20/2016 13:56     Medications:   . [START ON 10/21/2016] albumin human     . aspirin EC  81 mg Oral Daily  . [START ON 10/21/2016] atorvastatin  40 mg Oral Daily  . budesonide (PULMICORT) nebulizer solution  0.5 mg Nebulization BID  . [START ON 10/21/2016] furosemide  80 mg Oral Daily  . gabapentin  300 mg Oral QHS  . ipratropium-albuterol  3 mL Nebulization Q6H  . labetalol  100 mg Oral Daily  . methylPREDNISolone (SOLU-MEDROL)  injection  40 mg Intravenous Daily  . methylPREDNISolone sodium succinate      . multivitamin  1 tablet Oral Daily  . [START ON 10/21/2016] pantoprazole  40 mg Oral Daily  . sevelamer carbonate  2,400 mg Oral TID WC   nitroGLYCERIN  Assessment/ Plan:  56 y.o. 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 End Stage Renal Disease: patient did not have hemodialysis performed today.  Given the fact that he's having paracentesis today we will avoid dialysis to avoid any additional fluid removal.  2. Anemia of chronic kidney disease: hemoglobin currently 9.1.  Patient receives mircera as an outpt.   3. Hypertension: Continuelabetalol for now.  4. Ascites: secondary to hepatic cirrhosis: recurrent in nature.  Patient is undergoing paracentesis today.  5. Secondary Hyperparathyroidism:  Continue the patient on Renvela 24 mg by mouth 3 times a day with meals.  Check serum phosphorus tomorrow with dialysis.   LOS: 0 Devone Tousley 8/9/20184:13 PM

## 2016-10-21 MED ORDER — HEPARIN SODIUM (PORCINE) 1000 UNIT/ML DIALYSIS
1000.0000 [IU] | INTRAMUSCULAR | Status: DC | PRN
  Filled 2016-10-21: qty 1

## 2016-10-21 MED ORDER — SODIUM CHLORIDE 0.9 % IV SOLN: 100.0000 mL | INTRAVENOUS | Status: DC | PRN

## 2016-10-21 MED ORDER — PENTAFLUOROPROP-TETRAFLUOROETH EX AERO
1.0000 "application " | INHALATION_SPRAY | CUTANEOUS | Status: DC | PRN
  Filled 2016-10-21: qty 30

## 2016-10-21 MED ORDER — ALTEPLASE 2 MG IJ SOLR: 2.0000 mg | Freq: Once | INTRAMUSCULAR | Status: DC | PRN

## 2016-10-21 MED ORDER — LIDOCAINE-PRILOCAINE 2.5-2.5 % EX CREA
1.0000 "application " | TOPICAL_CREAM | CUTANEOUS | Status: DC | PRN
  Filled 2016-10-21: qty 5

## 2016-10-21 MED ORDER — LIDOCAINE HCL (PF) 1 % IJ SOLN
5.0000 mL | INTRAMUSCULAR | Status: DC | PRN
  Filled 2016-10-21: qty 5

## 2016-10-21 NOTE — Care Management (Signed)
Notification of code 47.  Patient left AMA prior to delivery of code 59 form

## 2016-10-21 NOTE — Progress Notes (Signed)
Pt choosing to leave AMA. This RN attempting to keep pt here for HD treatment since he has missed some of his previous treatments. Pt agreeable at first but now prefers to skip dialysis and leave AMA. Pt has signed AMA paper awaiting ride. MD aware. This nurse discussed consequences of leaving AMA with pt and verbalizes understanding.

## 2016-10-22 DIAGNOSIS — N2581 Secondary hyperparathyroidism of renal origin: Secondary | ICD-10-CM | POA: Diagnosis not present

## 2016-10-22 DIAGNOSIS — D649 Anemia, unspecified: Secondary | ICD-10-CM | POA: Diagnosis not present

## 2016-10-22 DIAGNOSIS — N186 End stage renal disease: Secondary | ICD-10-CM | POA: Diagnosis not present

## 2016-10-24 LAB — BODY FLUID CULTURE: CULTURE: NO GROWTH

## 2016-10-24 LAB — CYTOLOGY - NON PAP

## 2016-10-24 NOTE — Discharge Summary (Signed)
Stanley [name] at [place] NAME: Stanley [name]    MR#:  [id number]  DATE OF BIRTH:  [birth year]  DATE OF ADMISSION:  10/20/2016   ADMITTING PHYSICIAN: Loletha Grayer, MD  DATE OF DISCHARGE: 10/21/2016 11:31 AM  PRIMARY CARE PHYSICIAN: [name], MD   ADMISSION DIAGNOSIS:  SOB (shortness of breath) [R06.02] COPD exacerbation (HCC) [J44.1] ESRD (end stage renal disease) on dialysis ([place]) [N18.6, Z99.2] Elevated troponin I level [R74.8] Pulmonary vascular congestion [R09.89] Cirrhosis of liver with ascites (HCC) [K74.60] Ascites due to alcoholic cirrhosis ([place]) [C37.62] Acute on chronic heart failure, unspecified heart failure type ([place]) [I50.9] DISCHARGE DIAGNOSIS:  Active Problems:   Ascites  SECONDARY DIAGNOSIS:   Past Medical History:  Diagnosis Date  . Alcohol abuse   . Cirrhosis ([place])   . Coronary artery disease 2009  . Diabetic peripheral neuropathy ([place])   . Drug abuse   . End stage renal disease on dialysis [facility]) NEPHROLOGIST-   DR [facility]  IN [place]   HEMODIALYSIS --   TUES/  THURS/  SAT  . GERD (gastroesophageal reflux disease)   . Hyperlipidemia   . Hypertension   . PAD (peripheral artery disease) ([place])   . Renal insufficiency    Per pt, 32 oz fluid restriction per day  . S/P triple vessel bypass 06/09/2016   2009ish  . Suicidal ideation    & HOMICIDAL IDEATION --  06-16-2013   ADMITTED TO [facility] COURSE:  56 y.o. male with a known history of Cirrhosis with ascites, COPD and end-stage renal disease Admitted for shortness of breath and abdominal distention.   He left AGAINST MEDICAL ADVICE before I could see him this morning.   DISCHARGE CONDITIONS:  STABLE CONSULTS OBTAINED:  Treatment Team:  Anthonette Legato, MD DRUG ALLERGIES:  No Known Allergies DISCHARGE MEDICATIONS:   Allergies as of 10/21/2016   No Known Allergies     Medication List    ASK your doctor about these  medications   albuterol 108 (90 Base) MCG/ACT inhaler Commonly known as:  PROVENTIL HFA;VENTOLIN HFA Inhale 4-6 puffs by mouth every 4 hours as needed for wheezing, cough, and/or shortness of breath   aspirin EC 81 MG tablet Take 81 mg by mouth daily.   atorvastatin 40 MG tablet Commonly known as:  LIPITOR Take 40 mg by mouth daily.   budesonide-formoterol 160-4.5 MCG/ACT inhaler Commonly known as:  SYMBICORT Inhale 2 puffs into the lungs daily.   DIALYVITE SUPREME D 3 MG Tabs Take 1 tablet by mouth daily.   furosemide 80 MG tablet Commonly known as:  LASIX Take 80 mg by mouth daily.   gabapentin 300 MG capsule Commonly known as:  NEURONTIN Take 1 capsule (300 mg total) by mouth daily.   labetalol 100 MG tablet Commonly known as:  NORMODYNE Take 1 tablet (100 mg total) by mouth daily.   nitroGLYCERIN 0.4 MG SL tablet Commonly known as:  NITROSTAT Place 1 tablet (0.4 mg total) under the tongue every 5 (five) minutes as needed.   pantoprazole 40 MG tablet Commonly known as:  PROTONIX Take 1 tablet (40 mg total) by mouth daily.   sevelamer carbonate 800 MG tablet Commonly known as:  RENVELA Take 3 tablets (2,400 mg total) by mouth 3 (three) times daily with meals.        DISCHARGE LOCATION:  home   If you experience worsening of your admission symptoms, develop shortness of breath, life threatening emergency,  suicidal or homicidal thoughts you must seek medical attention immediately by calling 911 or calling your MD immediately  if symptoms less severe.  You Must read complete instructions/literature along with all the possible adverse reactions/side effects for all the Medicines you take and that have been prescribed to you. Take any new Medicines after you have completely understood and accpet all the possible adverse reactions/side effects.   Please note  You were cared for by a hospitalist during your hospital stay. If you have any questions about your  discharge medications or the care you received while you were in the hospital after you are discharged, you can call the unit and asked to speak with the hospitalist on call if the hospitalist that took care of you is not available. Once you are discharged, your primary care physician will handle any further medical issues. Please note that NO REFILLS for any discharge medications will be authorized once you are discharged, as it is imperative that you return to your primary care physician (or establish a relationship with a primary care physician if you do not have one) for your aftercare needs so that they can reassess your need for medications and monitor your lab values.      CBC  Recent Labs Lab 10/20/16 1153  WBC 6.3  HGB 9.1*  HCT 30.9*  PLT 280    Chemistries   Recent Labs Lab 10/20/16 1153  NA 142  K 5.1  CL 99*  CO2 28  GLUCOSE 90  BUN 35*  CREATININE 7.47*  CALCIUM 9.0  AST 26  ALT 23  ALKPHOS 149*  BILITOT 0.5       Marybell Robards M.D on 10/24/2016 at 11:52 AM  Between 7am to 6pm - Pager - [phone]  After 6pm go to [email or web address] - Proofreader  [facility]  Office  [phone]  CC: Primary care physician; [name], MD   Note: This dictation was prepared with Dragon dictation along with smaller phrase technology. Any transcriptional errors that result from this process are unintentional.

## 2016-10-25 DIAGNOSIS — N186 End stage renal disease: Secondary | ICD-10-CM | POA: Diagnosis not present

## 2016-10-25 DIAGNOSIS — D649 Anemia, unspecified: Secondary | ICD-10-CM | POA: Diagnosis not present

## 2016-10-25 DIAGNOSIS — N2581 Secondary hyperparathyroidism of renal origin: Secondary | ICD-10-CM | POA: Diagnosis not present

## 2016-10-25 LAB — PATHOLOGY

## 2016-10-27 DIAGNOSIS — D649 Anemia, unspecified: Secondary | ICD-10-CM | POA: Diagnosis not present

## 2016-10-27 DIAGNOSIS — N186 End stage renal disease: Secondary | ICD-10-CM | POA: Diagnosis not present

## 2016-10-27 DIAGNOSIS — N2581 Secondary hyperparathyroidism of renal origin: Secondary | ICD-10-CM | POA: Diagnosis not present

## 2016-10-29 ENCOUNTER — Other Ambulatory Visit: Payer: Self-pay

## 2016-10-29 ENCOUNTER — Encounter: Payer: Self-pay | Admitting: Emergency Medicine

## 2016-10-29 ENCOUNTER — Emergency Department: Payer: Medicare Other

## 2016-10-29 ENCOUNTER — Inpatient Hospital Stay: Payer: Medicare Other

## 2016-10-29 ENCOUNTER — Observation Stay
Admission: EM | Admit: 2016-10-29 | Discharge: 2016-10-30 | Disposition: A | Discharge status: Home / Self Care | Payer: Medicare Other | Attending: Internal Medicine | Admitting: Internal Medicine

## 2016-10-29 DIAGNOSIS — E875 Hyperkalemia: Secondary | ICD-10-CM | POA: Insufficient documentation

## 2016-10-29 DIAGNOSIS — J441 Chronic obstructive pulmonary disease with (acute) exacerbation: Principal | ICD-10-CM | POA: Diagnosis present

## 2016-10-29 DIAGNOSIS — K7460 Unspecified cirrhosis of liver: Secondary | ICD-10-CM | POA: Insufficient documentation

## 2016-10-29 DIAGNOSIS — N186 End stage renal disease: Secondary | ICD-10-CM | POA: Insufficient documentation

## 2016-10-29 DIAGNOSIS — M7989 Other specified soft tissue disorders: Secondary | ICD-10-CM | POA: Diagnosis not present

## 2016-10-29 DIAGNOSIS — E785 Hyperlipidemia, unspecified: Secondary | ICD-10-CM | POA: Insufficient documentation

## 2016-10-29 DIAGNOSIS — N179 Acute kidney failure, unspecified: Secondary | ICD-10-CM | POA: Diagnosis not present

## 2016-10-29 DIAGNOSIS — F17200 Nicotine dependence, unspecified, uncomplicated: Secondary | ICD-10-CM | POA: Diagnosis not present

## 2016-10-29 DIAGNOSIS — J449 Chronic obstructive pulmonary disease, unspecified: Secondary | ICD-10-CM | POA: Diagnosis present

## 2016-10-29 DIAGNOSIS — Z79899 Other long term (current) drug therapy: Secondary | ICD-10-CM | POA: Diagnosis not present

## 2016-10-29 DIAGNOSIS — I340 Nonrheumatic mitral (valve) insufficiency: Secondary | ICD-10-CM | POA: Insufficient documentation

## 2016-10-29 DIAGNOSIS — M79673 Pain in unspecified foot: Secondary | ICD-10-CM

## 2016-10-29 DIAGNOSIS — Z992 Dependence on renal dialysis: Secondary | ICD-10-CM | POA: Diagnosis not present

## 2016-10-29 DIAGNOSIS — N189 Chronic kidney disease, unspecified: Secondary | ICD-10-CM

## 2016-10-29 DIAGNOSIS — F17210 Nicotine dependence, cigarettes, uncomplicated: Secondary | ICD-10-CM | POA: Insufficient documentation

## 2016-10-29 DIAGNOSIS — K7290 Hepatic failure, unspecified without coma: Secondary | ICD-10-CM | POA: Insufficient documentation

## 2016-10-29 DIAGNOSIS — E1142 Type 2 diabetes mellitus with diabetic polyneuropathy: Secondary | ICD-10-CM | POA: Diagnosis not present

## 2016-10-29 DIAGNOSIS — R7989 Other specified abnormal findings of blood chemistry: Secondary | ICD-10-CM | POA: Insufficient documentation

## 2016-10-29 DIAGNOSIS — M79671 Pain in right foot: Secondary | ICD-10-CM | POA: Diagnosis not present

## 2016-10-29 DIAGNOSIS — D631 Anemia in chronic kidney disease: Secondary | ICD-10-CM | POA: Insufficient documentation

## 2016-10-29 DIAGNOSIS — N2581 Secondary hyperparathyroidism of renal origin: Secondary | ICD-10-CM | POA: Diagnosis not present

## 2016-10-29 DIAGNOSIS — Z716 Tobacco abuse counseling: Secondary | ICD-10-CM | POA: Diagnosis not present

## 2016-10-29 DIAGNOSIS — R0902 Hypoxemia: Secondary | ICD-10-CM | POA: Insufficient documentation

## 2016-10-29 DIAGNOSIS — I2510 Atherosclerotic heart disease of native coronary artery without angina pectoris: Secondary | ICD-10-CM | POA: Insufficient documentation

## 2016-10-29 DIAGNOSIS — Q8781 Alport syndrome: Secondary | ICD-10-CM | POA: Insufficient documentation

## 2016-10-29 DIAGNOSIS — R188 Other ascites: Secondary | ICD-10-CM | POA: Insufficient documentation

## 2016-10-29 DIAGNOSIS — K219 Gastro-esophageal reflux disease without esophagitis: Secondary | ICD-10-CM | POA: Insufficient documentation

## 2016-10-29 DIAGNOSIS — R1111 Vomiting without nausea: Secondary | ICD-10-CM | POA: Diagnosis not present

## 2016-10-29 DIAGNOSIS — Z7982 Long term (current) use of aspirin: Secondary | ICD-10-CM | POA: Diagnosis not present

## 2016-10-29 DIAGNOSIS — E1151 Type 2 diabetes mellitus with diabetic peripheral angiopathy without gangrene: Secondary | ICD-10-CM | POA: Insufficient documentation

## 2016-10-29 DIAGNOSIS — R0602 Shortness of breath: Secondary | ICD-10-CM | POA: Diagnosis not present

## 2016-10-29 DIAGNOSIS — R748 Abnormal levels of other serum enzymes: Secondary | ICD-10-CM | POA: Diagnosis not present

## 2016-10-29 DIAGNOSIS — E1122 Type 2 diabetes mellitus with diabetic chronic kidney disease: Secondary | ICD-10-CM | POA: Insufficient documentation

## 2016-10-29 DIAGNOSIS — I120 Hypertensive chronic kidney disease with stage 5 chronic kidney disease or end stage renal disease: Secondary | ICD-10-CM | POA: Insufficient documentation

## 2016-10-29 DIAGNOSIS — D649 Anemia, unspecified: Secondary | ICD-10-CM | POA: Diagnosis not present

## 2016-10-29 LAB — BASIC METABOLIC PANEL
Anion gap: 15 (ref 5–15)
BUN: 31 mg/dL — ABNORMAL HIGH (ref 6–20)
CO2: 30 mmol/L (ref 22–32)
Calcium: 9.1 mg/dL (ref 8.9–10.3)
Chloride: 96 mmol/L — ABNORMAL LOW (ref 101–111)
Creatinine, Ser: 7.4 mg/dL — ABNORMAL HIGH (ref 0.61–1.24)
GFR calc Af Amer: 8 mL/min — ABNORMAL LOW (ref >60)
GFR calc non Af Amer: 7 mL/min — ABNORMAL LOW (ref >60)
Glucose, Bld: 107 mg/dL — ABNORMAL HIGH (ref 65–99)
Potassium: 6 mmol/L — ABNORMAL HIGH (ref 3.5–5.1)
Sodium: 141 mmol/L (ref 135–145)

## 2016-10-29 LAB — CBC
HCT: 32 % — ABNORMAL LOW (ref 40.0–52.0)
HEMOGLOBIN: 9.7 g/dL — AB (ref 13.0–18.0)
MCH: 24.3 pg — ABNORMAL LOW (ref 26.0–34.0)
MCHC: 30.3 g/dL — ABNORMAL LOW (ref 32.0–36.0)
MCV: 80.2 fL (ref 80.0–100.0)
PLATELETS: 299 10*3/uL (ref 150–440)
RBC: 3.99 MIL/uL — AB (ref 4.40–5.90)
RDW: 18.7 % — ABNORMAL HIGH (ref 11.5–14.5)
WBC: 8.3 10*3/uL (ref 3.8–10.6)

## 2016-10-29 LAB — PROCALCITONIN: PROCALCITONIN: 0.17 ng/mL

## 2016-10-29 LAB — TROPONIN I: TROPONIN I: 0.06 ng/mL — AB (ref <0.03)

## 2016-10-29 LAB — GLUCOSE, CAPILLARY: Glucose-Capillary: 177 mg/dL — ABNORMAL HIGH (ref 65–99)

## 2016-10-29 MED ORDER — SEVELAMER CARBONATE 800 MG PO TABS
2400.0000 mg | ORAL_TABLET | Freq: Three times a day (TID) | ORAL | Status: DC
  Administered 2016-10-30: 2400 mg via ORAL
  Filled 2016-10-29: qty 3

## 2016-10-29 MED ORDER — NITROGLYCERIN 0.4 MG SL SUBL: 0.4000 mg | SUBLINGUAL_TABLET | SUBLINGUAL | Status: DC | PRN

## 2016-10-29 MED ORDER — METHYLPREDNISOLONE SODIUM SUCC 125 MG IJ SOLR
125.0000 mg | Freq: Once | INTRAMUSCULAR | Status: AC
  Administered 2016-10-29: 125 mg via INTRAVENOUS
  Filled 2016-10-29: qty 2

## 2016-10-29 MED ORDER — GI COCKTAIL ~~LOC~~
30.0000 mL | Freq: Two times a day (BID) | ORAL | Status: DC | PRN
  Administered 2016-10-29: 30 mL via ORAL
  Filled 2016-10-29 (×3): qty 30

## 2016-10-29 MED ORDER — IPRATROPIUM-ALBUTEROL 0.5-2.5 (3) MG/3ML IN SOLN
3.0000 mL | Freq: Four times a day (QID) | RESPIRATORY_TRACT | Status: DC
  Administered 2016-10-29 – 2016-10-30 (×3): 3 mL via RESPIRATORY_TRACT
  Filled 2016-10-29 (×3): qty 3

## 2016-10-29 MED ORDER — AMOXICILLIN-POT CLAVULANATE 500-125 MG PO TABS
1.0000 | ORAL_TABLET | Freq: Every day | ORAL | Status: DC
  Filled 2016-10-29: qty 1

## 2016-10-29 MED ORDER — RENA-VITE PO TABS
1.0000 | ORAL_TABLET | Freq: Every day | ORAL | Status: DC
  Administered 2016-10-30: 1 via ORAL
  Filled 2016-10-29: qty 1

## 2016-10-29 MED ORDER — IPRATROPIUM-ALBUTEROL 0.5-2.5 (3) MG/3ML IN SOLN
3.0000 mL | Freq: Once | RESPIRATORY_TRACT | Status: AC
  Administered 2016-10-29: 3 mL via RESPIRATORY_TRACT
  Filled 2016-10-29: qty 3

## 2016-10-29 MED ORDER — ONDANSETRON HCL 4 MG/2ML IJ SOLN
4.0000 mg | Freq: Four times a day (QID) | INTRAMUSCULAR | Status: DC | PRN
  Administered 2016-10-29 – 2016-10-30 (×2): 4 mg via INTRAVENOUS
  Filled 2016-10-29 (×2): qty 2

## 2016-10-29 MED ORDER — DEXTROSE 5 % IV SOLN
2.0000 g | Freq: Every day | INTRAVENOUS | Status: DC
  Administered 2016-10-29: 2 g via INTRAVENOUS
  Filled 2016-10-29 (×2): qty 2

## 2016-10-29 MED ORDER — ACETAMINOPHEN 325 MG PO TABS: 650.0000 mg | ORAL_TABLET | Freq: Four times a day (QID) | ORAL | Status: DC | PRN

## 2016-10-29 MED ORDER — ASPIRIN EC 81 MG PO TBEC
81.0000 mg | DELAYED_RELEASE_TABLET | Freq: Every day | ORAL | Status: DC
  Administered 2016-10-30: 81 mg via ORAL
  Filled 2016-10-29: qty 1

## 2016-10-29 MED ORDER — FUROSEMIDE 40 MG PO TABS
80.0000 mg | ORAL_TABLET | Freq: Every day | ORAL | Status: DC
  Administered 2016-10-30: 80 mg via ORAL
  Filled 2016-10-29: qty 2

## 2016-10-29 MED ORDER — BUDESONIDE 0.5 MG/2ML IN SUSP
0.5000 mg | Freq: Two times a day (BID) | RESPIRATORY_TRACT | Status: DC
  Administered 2016-10-29 – 2016-10-30 (×2): 0.5 mg via RESPIRATORY_TRACT
  Filled 2016-10-29 (×2): qty 2

## 2016-10-29 MED ORDER — ACETAMINOPHEN 650 MG RE SUPP: 650.0000 mg | Freq: Four times a day (QID) | RECTAL | Status: DC | PRN

## 2016-10-29 MED ORDER — LABETALOL HCL 100 MG PO TABS
100.0000 mg | ORAL_TABLET | Freq: Every day | ORAL | Status: DC
  Administered 2016-10-30: 100 mg via ORAL
  Filled 2016-10-29: qty 1

## 2016-10-29 MED ORDER — HEPARIN SODIUM (PORCINE) 5000 UNIT/ML IJ SOLN
5000.0000 [IU] | Freq: Three times a day (TID) | INTRAMUSCULAR | Status: DC
  Administered 2016-10-29 – 2016-10-30 (×2): 5000 [IU] via SUBCUTANEOUS
  Filled 2016-10-29 (×2): qty 1

## 2016-10-29 MED ORDER — ATORVASTATIN CALCIUM 20 MG PO TABS: 40.0000 mg | ORAL_TABLET | Freq: Every day | ORAL | Status: DC

## 2016-10-29 MED ORDER — PANTOPRAZOLE SODIUM 40 MG PO TBEC
40.0000 mg | DELAYED_RELEASE_TABLET | Freq: Every day | ORAL | Status: DC
  Administered 2016-10-30: 40 mg via ORAL
  Filled 2016-10-29: qty 1

## 2016-10-29 MED ORDER — METHYLPREDNISOLONE SODIUM SUCC 40 MG IJ SOLR
40.0000 mg | Freq: Every day | INTRAMUSCULAR | Status: DC
  Administered 2016-10-30: 40 mg via INTRAVENOUS
  Filled 2016-10-29: qty 1

## 2016-10-29 NOTE — ED Notes (Signed)
AAOx3.  Skin warm and dry.  NAD 

## 2016-10-29 NOTE — Progress Notes (Signed)
POST DIALYSIS ASSESSMENT 

## 2016-10-29 NOTE — Consult Note (Signed)
Pharmacy Antibiotic Note  Stanley [name] is a 56 y.o. male admitted on 10/29/2016 with URTI.  Pharmacy has been consulted for ceftriaxone dosing.  Plan: Ceftriaxone 2g q 24 hr  Height: 6\' 3"  (190.5 cm) Weight: 178 lb (80.7 kg) IBW/kg (Calculated) : 84.5  Temp (24hrs), Avg:99 F (37.2 C), Min:98.6 F (37 C), Max:99.8 F (37.7 C)   Recent Labs Lab 10/29/16 1254  WBC 8.3  CREATININE 7.40*    Estimated Creatinine Clearance: 12.6 mL/min (A) (by C-G formula based on SCr of 7.4 mg/dL (H)).    No Known Allergies  Antimicrobials this admission: ceftriaxone 8/18 >>    Dose adjustments this admission:   Microbiology results: PCT=0.17  Thank you for allowing pharmacy to be a part of this patient's care.  Ramond Dial, Pharm.D, BCPS Clinical Pharmacist  10/29/2016 9:02 PM

## 2016-10-29 NOTE — Progress Notes (Signed)
HD STARTED  

## 2016-10-29 NOTE — ED Triage Notes (Addendum)
Arrives from [facility] via [facility] for c/o cough and SOB.  States symptoms have been ongoing x 2-3 days.  States he is out of his inhalers.  Patient arrives with Right arm AV fistula accessed-- patient only received 20 minutes of treatment today.  Received one Duoneb from EMS

## 2016-10-29 NOTE — Progress Notes (Signed)
HD COMPLETED  

## 2016-10-29 NOTE — H&P (Signed)
[place] [facility] [place] NAME: Stanley [name]    MR#:  [id number]  DATE OF BIRTH:  [birth year]  DATE OF ADMISSION:  10/29/2016  PRIMARY CARE PHYSICIAN: [name], MD   REQUESTING/REFERRING PHYSICIAN: Dr Lisa Roca  CHIEF COMPLAINT:   Chief Complaint  Patient presents with  . Cough  . Shortness of Breath    HISTORY OF PRESENT ILLNESS:  Stanley [name]  is a 56 y.o. male with a known history of Cirrhosis of liver, CAD, COPD and end-stage renal disease on hemodialysis. He presents with cough and shortness of breath. He received 20 minutes of dialysis today and asked to go to the ER after he vomited. The patient complains of fever, sweats, shortness of breath and cough and wheezing. Hospitalist services were contacted for further evaluation. I personally admitted this patient on 10/20/2016.  PAST MEDICAL HISTORY:   Past Medical History:  Diagnosis Date  . Alcohol abuse   . Cirrhosis ([place])   . Coronary artery disease 2009  . Diabetic peripheral neuropathy ([place])   . Drug abuse   . End stage renal disease on dialysis [facility]) NEPHROLOGIST-   DR [facility]  IN [place]   HEMODIALYSIS --   TUES/  THURS/  SAT  . GERD (gastroesophageal reflux disease)   . Hyperlipidemia   . Hypertension   . PAD (peripheral artery disease) ([place])   . Renal insufficiency    Per pt, 32 oz fluid restriction per day  . S/P triple vessel bypass 06/09/2016   2009ish  . Suicidal ideation    & HOMICIDAL IDEATION --  06-16-2013   ADMITTED TO BEHAVIOR HEALTH    PAST SURGICAL HISTORY:   Past Surgical History:  Procedure Laterality Date  . AGILE CAPSULE N/A 06/19/2016   Procedure: AGILE CAPSULE;  Surgeon: [name], MD;  Location: [facility] ENDOSCOPY;  Service: Endoscopy;  Laterality: N/A;  . COLONOSCOPY WITH PROPOFOL N/A 06/18/2016   Procedure: COLONOSCOPY WITH PROPOFOL;  Surgeon: [name], MD;  Location: [facility] ENDOSCOPY;  Service: Endoscopy;  Laterality:  N/A;  . COLONOSCOPY WITH PROPOFOL N/A 08/12/2016   Procedure: COLONOSCOPY WITH PROPOFOL;  Surgeon: [name], MD;  Location: [facility] ENDOSCOPY;  Service: Endoscopy;  Laterality: N/A;  . CORONARY ANGIOPLASTY  ?   PT UNABLE TO TELL IF  BEFORE OR AFTER  CABG  . CORONARY ARTERY BYPASS GRAFT  2008  ([place])   3 VESSEL  . DIALYSIS FISTULA CREATION  LAST SURGERY  APPOX  2008  . ENTEROSCOPY N/A 05/10/2016   Procedure: ENTEROSCOPY;  Surgeon: Jerene Bears, MD;  Location: [place];  Service: Gastroenterology;  Laterality: N/A;  . ENTEROSCOPY N/A 08/12/2016   Procedure: ENTEROSCOPY;  Surgeon: [name], MD;  Location: [facility] ENDOSCOPY;  Service: Endoscopy;  Laterality: N/A;  . ESOPHAGOGASTRODUODENOSCOPY N/A 05/07/2015   Procedure: ESOPHAGOGASTRODUODENOSCOPY (EGD);  Surgeon: Hulen Luster, MD;  Location: [facility] ENDOSCOPY;  Service: Endoscopy;  Laterality: N/A;  . ESOPHAGOGASTRODUODENOSCOPY (EGD) WITH PROPOFOL N/A 05/17/2015   Procedure: ESOPHAGOGASTRODUODENOSCOPY (EGD) WITH PROPOFOL;  Surgeon: [name], MD;  Location: [facility] ENDOSCOPY;  Service: Endoscopy;  Laterality: N/A;  . ESOPHAGOGASTRODUODENOSCOPY (EGD) WITH PROPOFOL N/A 01/20/2016   Procedure: ESOPHAGOGASTRODUODENOSCOPY (EGD) WITH PROPOFOL;  Surgeon: [name], MD;  Location: [facility] ENDOSCOPY;  Service: Endoscopy;  Laterality: N/A;  . ESOPHAGOGASTRODUODENOSCOPY (EGD) WITH PROPOFOL N/A 04/17/2016   Procedure: ESOPHAGOGASTRODUODENOSCOPY (EGD) WITH PROPOFOL;  Surgeon: Lin Landsman, MD;  Location: [facility] ENDOSCOPY;  Service: Gastroenterology;  Laterality: N/A;  . ESOPHAGOGASTRODUODENOSCOPY (EGD)  WITH PROPOFOL  05/09/2016   Procedure: ESOPHAGOGASTRODUODENOSCOPY (EGD) WITH PROPOFOL;  Surgeon: Jerene Bears, MD;  Location: [place];  Service: Endoscopy;;  . ESOPHAGOGASTRODUODENOSCOPY (EGD) WITH PROPOFOL N/A 06/16/2016   Procedure: ESOPHAGOGASTRODUODENOSCOPY (EGD) WITH PROPOFOL;  Surgeon: [name], MD;  Location: [facility] ENDOSCOPY;  Service: Endoscopy;   Laterality: N/A;  . GIVENS CAPSULE STUDY N/A 05/07/2016   Procedure: GIVENS CAPSULE STUDY;  Surgeon: Doran Stabler, MD;  Location: [place];  Service: Endoscopy;  Laterality: N/A;  . MANDIBULAR HARDWARE REMOVAL N/A 07/29/2013   Procedure: REMOVAL OF ARCH BARS;  Surgeon: Theodoro Kos, DO;  Location: [place];  Service: Plastics;  Laterality: N/A;  . ORIF MANDIBULAR FRACTURE N/A 06/05/2013   Procedure: REPAIR OF MANDIBULAR FRACTURE x 2 with maxillo-mandibular fixation ;  Surgeon: Theodoro Kos, DO;  Location: [name];  Service: Plastics;  Laterality: N/A;  . PERIPHERAL ARTERIAL STENT GRAFT Left     SOCIAL HISTORY:   Social History  Substance Use Topics  . Smoking status: Current Every Day Smoker    Packs/day: 0.15    Years: 40.00    Types: Cigarettes  . Smokeless tobacco: Never Used  . Alcohol use No     Comment: pt reports quitting after learning about cirrhosis    FAMILY HISTORY:   Family History  Problem Relation Age of Onset  . Colon cancer Mother   . Cancer Father   . Cancer Sister   . Kidney disease Brother     DRUG ALLERGIES:  No Known Allergies  REVIEW OF SYSTEMS:  CONSTITUTIONAL: Positive for fever, sweats and fatigue.  EYES: No blurred or double vision.   EARS, NOSE, AND THROAT: No tinnitus or ear pain. Positive for sore throat. Positive runny nose RESPIRATORY: Positive for cough and shortness of breath. Positive for wheezing. No hemoptysis.  CARDIOVASCULAR: Positive for chest pain, orthopnea, edema.  GASTROINTESTINAL: Positive for nausea and vomiting. No diarrhea or abdominal pain. Occasional blood in bowel movements GENITOURINARY: No dysuria, hematuria.  ENDOCRINE: No polyuria, nocturia,  HEMATOLOGY: No anemia, easy bruising or bleeding SKIN: No rash or lesion. MUSCULOSKELETAL: No joint pain or arthritis.   NEUROLOGIC: No tingling, numbness, weakness.  PSYCHIATRY: No anxiety or depression.   MEDICATIONS AT HOME:   Prior to Admission  medications   Medication Sig Start Date End Date Taking? Authorizing Provider  aspirin EC 81 MG tablet Take 81 mg by mouth daily.   Yes [provider]  atorvastatin (LIPITOR) 40 MG tablet Take 40 mg by mouth daily. 04/19/16  Yes [provider]  budesonide-formoterol (SYMBICORT) 160-4.5 MCG/ACT inhaler Inhale 2 puffs into the lungs daily.   Yes [provider]  furosemide (LASIX) 80 MG tablet Take 80 mg by mouth daily. 05/11/16  Yes [provider]  labetalol (NORMODYNE) 100 MG tablet Take 1 tablet (100 mg total) by mouth daily. 01/22/16  Yes Epifanio Lesches, MD  Multiple Vitamins-Minerals-FA (DIALYVITE SUPREME D) 3 MG TABS Take 1 tablet by mouth daily. 05/27/16  Yes [provider]  pantoprazole (PROTONIX) 40 MG tablet Take 1 tablet (40 mg total) by mouth daily. 05/10/16  Yes Ghimire, Henreitta Leber, MD  sevelamer carbonate (RENVELA) 800 MG tablet Take 3 tablets (2,400 mg total) by mouth 3 (three) times daily with meals. 06/01/16  Yes Theodoro Grist, MD  albuterol (PROVENTIL HFA;VENTOLIN HFA) 108 (90 Base) MCG/ACT inhaler Inhale 4-6 puffs by mouth every 4 hours as needed for wheezing, cough, and/or shortness of breath 07/11/16   Hinda Kehr, MD  gabapentin (NEURONTIN) 300 MG capsule Take 1 capsule (300 mg total) by mouth daily. Patient not taking: Reported on 10/29/2016 03/27/15   Bettey Costa, MD  nitroGLYCERIN (NITROSTAT) 0.4 MG SL tablet Place 1 tablet (0.4 mg total) under the tongue every 5 (five) minutes as needed. 04/12/16   Wende Bushy, MD      VITAL SIGNS:  Blood pressure (!) 146/75, pulse 93, temperature 99.8 F (37.7 C), temperature source Oral, resp. rate (!) 22, height 6\' 3"  (1.905 m), weight 81.6 kg (180 lb), SpO2 100 %.  PHYSICAL EXAMINATION:  GENERAL:  56 y.o.-year-old patient lying in the bed with no acute distress.  EYES: Pupils equal, round, reactive to light and accommodation. No scleral icterus. Extraocular muscles intact.  HEENT:  Head atraumatic, normocephalic. Oropharynx and nasopharynx clear.  Slight erythema bilateral tympanic membrane. NECK:  Supple, no jugular venous distention. No thyroid enlargement, no tenderness.  LUNGS: Decreased breath sounds bilaterally, positive wheezing. No rales, rhonchi or crepitation. No use of accessory muscles of respiration.  CARDIOVASCULAR: S1, S2 normal. No murmurs, rubs, or gallops.  ABDOMEN: Soft, nontender, nondistended. Bowel sounds present. No organomegaly or mass.  EXTREMITIES: Trace pedal edema. No cyanosis, or clubbing.  NEUROLOGIC: Cranial nerves II through XII are intact. Muscle strength 5/5 in all extremities. Sensation intact. Gait not checked.  PSYCHIATRIC: The patient is alert and oriented x 3.  SKIN: No rash, lesion, or ulcer.   LABORATORY PANEL:   CBC  Recent Labs Lab 10/29/16 1254  WBC 8.3  HGB 9.7*  HCT 32.0*  PLT 299   ------------------------------------------------------------------------------------------------------------------  Chemistries   Recent Labs Lab 10/29/16 1254  NA 141  K 6.0*  CL 96*  CO2 30  GLUCOSE 107*  BUN 31*  CREATININE 7.40*  CALCIUM 9.1   ------------------------------------------------------------------------------------------------------------------  Cardiac Enzymes  Recent Labs Lab 10/29/16 1254  TROPONINI 0.06*   ------------------------------------------------------------------------------------------------------------------  RADIOLOGY:  Dg Chest 2 View  Result Date: 10/29/2016 CLINICAL DATA:  COPD.  Shortness of breath. EXAM: CHEST  2 VIEW COMPARISON:  10/20/2016 FINDINGS: Bilateral diffuse interstitial thickening. No pleural effusion or pneumothorax. No focal consolidation. Stable cardiomediastinal silhouette. Prior CABG. No acute osseous abnormality. IMPRESSION: Cardiomegaly with mild pulmonary vascular congestion. Electronically Signed   By: Kathreen Devoid   On: 10/29/2016 13:45    EKG:   Normal  sinus rhythm 92 bpm  IMPRESSION AND PLAN:   1. COPD exacerbation. Solu-Medrol daily, budesonide and DuoNeb nebulizer solution while here in the hospital. Send off protocol calcitonin to see if there is a bacterial infection. 2. Hyperkalemia and end-stage renal disease patient. Today is patient's usual dialysis day. ER physician spoke with nephrologist and dialysis will be done here. The patient had 20 minutes of dialysis as outpatient today. 3. Borderline troponin demand ischemia or false positive from chronic kidney disease. 4. Tobacco abuse smoking cessation counseling done 4 minutes by me 5. Cirrhosis of the liver and ascites. Last admission had 6.7 L taken off his abdomen. 6. Essential hypertension on labetalol 7. Hyperlipidemia unspecified on atorvastatin 8. GERD on Protonix 9. Anemia of chronic disease on Procrit with dialysis  All the records are reviewed and case discussed with ED provider. Management plans discussed with the patient, family and they are in agreement.  CODE STATUS: Full code  TOTAL TIME TAKING CARE OF THIS PATIENT: 50 minutes.    Loletha Grayer M.D on 10/29/2016 at 2:56 PM  Between 7am to 6pm - Pager - [phone]  After 6pm call admission pager [phone]  [facility]  Office  [phone]  CC: Primary care physician; [name], MD

## 2016-10-29 NOTE — Progress Notes (Signed)
[facility]  ROUNDING NOTE   Subjective:   Patient with shortness of breath and cough. Patient was at dialysis center when he got short of breath and came to the ED.   Objective:  Vital signs in last 24 hours:  Temp:  [99.8 F (37.7 C)] 99.8 F (37.7 C) (08/18 1249) Pulse Rate:  [93-94] 93 (08/18 1400) Resp:  [5-22] 22 (08/18 1400) BP: (129-146)/(69-78) 146/75 (08/18 1400) SpO2:  [97 %-100 %] 100 % (08/18 1400) Weight:  [81.6 kg (180 lb)] 81.6 kg (180 lb) (08/18 1250)  Weight change:  Filed Weights   10/29/16 1250  Weight: 81.6 kg (180 lb)    Intake/Output: No intake/output data recorded.   Intake/Output this shift:  No intake/output data recorded.  Physical Exam: General: No acute distress  Head: Normocephalic, atraumatic. Moist oral mucosal membranes  Eyes: Anicteric  Neck: Supple, trachea midline  Lungs:  Clear to auscultation, normal effort  Heart: S1S2 no rubs  Abdomen:  Soft, distended, BS present  Extremities: no peripheral edema.  Neurologic: Awake, alert, following commands  Skin: No lesions  Access: LUE AVF    Basic Metabolic Panel:  Recent Labs Lab 10/29/16 1254  NA 141  K 6.0*  CL 96*  CO2 30  GLUCOSE 107*  BUN 31*  CREATININE 7.40*  CALCIUM 9.1    Liver Function Tests: No results for input(s): AST, ALT, ALKPHOS, BILITOT, PROT, ALBUMIN in the last 168 hours. No results for input(s): LIPASE, AMYLASE in the last 168 hours. No results for input(s): AMMONIA in the last 168 hours.  CBC:  Recent Labs Lab 10/29/16 1254  WBC 8.3  HGB 9.7*  HCT 32.0*  MCV 80.2  PLT 299    Cardiac Enzymes:  Recent Labs Lab 10/29/16 1254  TROPONINI 0.06*    BNP: Invalid input(s): POCBNP  CBG: No results for input(s): GLUCAP in the last 168 hours.  Microbiology: Results for orders placed or performed during the hospital encounter of 10/20/16  Body fluid culture     Status: None   Collection Time: 10/20/16  3:00 PM  Result  Value Ref Range Status   Specimen Description PERITONEAL  Final   Special Requests NONE  Final   Gram Stain   Final    FEW WBC PRESENT, PREDOMINANTLY MONONUCLEAR NO ORGANISMS SEEN    Culture   Final    NO GROWTH 3 DAYS Performed at [facility], [place] [address] [place], [place] [address]    Report Status 10/24/2016 FINAL  Final  MRSA PCR Screening     Status: None   Collection Time: 10/20/16  5:31 PM  Result Value Ref Range Status   MRSA by PCR NEGATIVE NEGATIVE Final    Comment:        The GeneXpert MRSA Assay (FDA approved for NASAL specimens only), is one component of a comprehensive MRSA colonization surveillance program. It is not intended to diagnose MRSA infection nor to guide or monitor treatment for MRSA infections.     Coagulation Studies: No results for input(s): LABPROT, INR in the last 72 hours.  Urinalysis: No results for input(s): COLORURINE, LABSPEC, PHURINE, GLUCOSEU, HGBUR, BILIRUBINUR, KETONESUR, PROTEINUR, UROBILINOGEN, NITRITE, LEUKOCYTESUR in the last 72 hours.  Invalid input(s): APPERANCEUR    Imaging: Dg Chest 2 View  Result Date: 10/29/2016 CLINICAL DATA:  COPD.  Shortness of breath. EXAM: CHEST  2 VIEW COMPARISON:  10/20/2016 FINDINGS: Bilateral diffuse interstitial thickening. No pleural effusion or pneumothorax. No focal consolidation. Stable cardiomediastinal silhouette.  Prior CABG. No acute osseous abnormality. IMPRESSION: Cardiomegaly with mild pulmonary vascular congestion. Electronically Signed   By: Kathreen Devoid   On: 10/29/2016 13:45     Medications:       Assessment/ Plan:  56 y.o. 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 End Stage Renal Disease: patient did not have  hemodialysis performed today.   - Hemodialysis for later today. Orders prepared.   2. Anemia of chronic kidney disease: hemoglobin 9.7 - mircera as an outpatient  3. Hypertension: blood pressure at goal.  - resume home regimen: labetalol and furosemide.   4. Secondary Hyperparathyroidism:  - Continue sevelamer with meals. 3 tabs    LOS: 0 Britton Bera 8/18/20182:48 PM

## 2016-10-29 NOTE — ED Provider Notes (Signed)
[facility] Emergency Department Provider Note ____________________________________________   I have reviewed the triage vital signs and the triage nursing note.  HISTORY  Chief Complaint Cough and Shortness of Breath   Historian Patient  HPI Stanley [name] is a 56 y.o. male presenting from dialysis for complaint of shortness of breath, sounds it started about 2 days ago. He is out of his inhalers. He does have COPD. He states wheezing. Patient was at dialysis this morning and received about 20 minutes before he was sent to the emergency department.  Patient tells me he feels like he has fluid on them.  Denies chest pain. Denies palpitations.  Denies fevers. Denies productive sputum.  He does seem to be a bit of a poor historian.  From what I can tell, does not appear that he wears home O2.    Past Medical History:  Diagnosis Date  . Alcohol abuse   . Cirrhosis ([place])   . Coronary artery disease 2009  . Diabetic peripheral neuropathy ([place])   . Drug abuse   . End stage renal disease on dialysis [facility]) NEPHROLOGIST-   DR [facility]  IN [place]   HEMODIALYSIS --   TUES/  THURS/  SAT  . GERD (gastroesophageal reflux disease)   . Hyperlipidemia   . Hypertension   . PAD (peripheral artery disease) ([place])   . Renal insufficiency    Per pt, 32 oz fluid restriction per day  . S/P triple vessel bypass 06/09/2016   2009ish  . Suicidal ideation    & HOMICIDAL IDEATION --  06-16-2013   ADMITTED TO BEHAVIOR HEALTH    Patient Active Problem List   Diagnosis Date Noted  . Anemia   . Heme positive stool   . Ulceration of intestine   . Benign neoplasm of transverse colon   . Acute gastrointestinal hemorrhage   . Esophageal candidiasis ([place])   . Angiodysplasia of intestinal tract   . Acute respiratory failure with hypoxia ([place]) 07/03/2016  . GI bleeding 06/24/2016  . Rectal bleeding 06/14/2016  . Anemia of chronic disease 06/01/2016  . MRSA carrier  06/01/2016  . Chronic renal failure 05/23/2016  . Ischemic heart disease 05/23/2016  . Angiodysplasia of small intestine   . Melena   . Small bowel bleed not requiring more than 4 units of blood in 24 hours, ICU, or surgery   . Anemia due to chronic blood loss   . Abdominal pain 05/05/2016  . Acute posthemorrhagic anemia 04/17/2016  . Gastrointestinal bleed 04/17/2016  . History of esophagogastroduodenoscopy (EGD) 04/17/2016  . Elevated troponin 04/17/2016  . Alcohol abuse 04/17/2016  . Upper GI bleed 01/19/2016  . Blood in stool   . Angiodysplasia of stomach and duodenum with hemorrhage   . Gastritis   . Esophagitis, unspecified   . GI bleed 05/16/2015  . Acute GI bleeding   . Symptomatic anemia 04/30/2015  . HTN (hypertension) 04/06/2015  . GERD (gastroesophageal reflux disease) 04/06/2015  . HLD (hyperlipidemia) 04/06/2015  . Dyspnea 04/06/2015  . Cirrhosis of liver with ascites ([place]) 04/06/2015  . Ascites 04/06/2015  . GIB (gastrointestinal bleeding) 03/23/2015  . Homicidal ideation 06/19/2013  . Suicidal intent 06/19/2013  . Homicidal ideations 06/19/2013  . Hyperkalemia 06/16/2013  . Mandible fracture ([place]) 06/05/2013  . Fracture, mandible ([place]) 06/02/2013  . Coronary atherosclerosis of native coronary artery 06/02/2013  . ESRD on dialysis ([place]) 06/02/2013  . Mandible open fracture ([place]) 06/02/2013    Past Surgical History:  Procedure Laterality Date  .  AGILE CAPSULE N/A 06/19/2016   Procedure: AGILE CAPSULE;  Surgeon: [name], MD;  Location: [facility] ENDOSCOPY;  Service: Endoscopy;  Laterality: N/A;  . COLONOSCOPY WITH PROPOFOL N/A 06/18/2016   Procedure: COLONOSCOPY WITH PROPOFOL;  Surgeon: [name], MD;  Location: [facility] ENDOSCOPY;  Service: Endoscopy;  Laterality: N/A;  . COLONOSCOPY WITH PROPOFOL N/A 08/12/2016   Procedure: COLONOSCOPY WITH PROPOFOL;  Surgeon: [name], MD;  Location: [facility] ENDOSCOPY;  Service: Endoscopy;  Laterality: N/A;  . CORONARY ANGIOPLASTY   ?   PT UNABLE TO TELL IF  BEFORE OR AFTER  CABG  . CORONARY ARTERY BYPASS GRAFT  2008  ([place] , [place])   3 VESSEL  . DIALYSIS FISTULA CREATION  LAST SURGERY  APPOX  2008  . ENTEROSCOPY N/A 05/10/2016   Procedure: ENTEROSCOPY;  Surgeon: Jerene Bears, MD;  Location: [place];  Service: Gastroenterology;  Laterality: N/A;  . ENTEROSCOPY N/A 08/12/2016   Procedure: ENTEROSCOPY;  Surgeon: [name], MD;  Location: [facility] ENDOSCOPY;  Service: Endoscopy;  Laterality: N/A;  . ESOPHAGOGASTRODUODENOSCOPY N/A 05/07/2015   Procedure: ESOPHAGOGASTRODUODENOSCOPY (EGD);  Surgeon: Hulen Luster, MD;  Location: [facility] ENDOSCOPY;  Service: Endoscopy;  Laterality: N/A;  . ESOPHAGOGASTRODUODENOSCOPY (EGD) WITH PROPOFOL N/A 05/17/2015   Procedure: ESOPHAGOGASTRODUODENOSCOPY (EGD) WITH PROPOFOL;  Surgeon: [name], MD;  Location: [facility] ENDOSCOPY;  Service: Endoscopy;  Laterality: N/A;  . ESOPHAGOGASTRODUODENOSCOPY (EGD) WITH PROPOFOL N/A 01/20/2016   Procedure: ESOPHAGOGASTRODUODENOSCOPY (EGD) WITH PROPOFOL;  Surgeon: [name], MD;  Location: [facility] ENDOSCOPY;  Service: Endoscopy;  Laterality: N/A;  . ESOPHAGOGASTRODUODENOSCOPY (EGD) WITH PROPOFOL N/A 04/17/2016   Procedure: ESOPHAGOGASTRODUODENOSCOPY (EGD) WITH PROPOFOL;  Surgeon: Lin Landsman, MD;  Location: [facility] ENDOSCOPY;  Service: Gastroenterology;  Laterality: N/A;  . ESOPHAGOGASTRODUODENOSCOPY (EGD) WITH PROPOFOL  05/09/2016   Procedure: ESOPHAGOGASTRODUODENOSCOPY (EGD) WITH PROPOFOL;  Surgeon: Jerene Bears, MD;  Location: [place];  Service: Endoscopy;;  . ESOPHAGOGASTRODUODENOSCOPY (EGD) WITH PROPOFOL N/A 06/16/2016   Procedure: ESOPHAGOGASTRODUODENOSCOPY (EGD) WITH PROPOFOL;  Surgeon: [name], MD;  Location: [facility] ENDOSCOPY;  Service: Endoscopy;  Laterality: N/A;  . GIVENS CAPSULE STUDY N/A 05/07/2016   Procedure: GIVENS CAPSULE STUDY;  Surgeon: Doran Stabler, MD;  Location: [place];  Service: Endoscopy;  Laterality: N/A;  . MANDIBULAR HARDWARE  REMOVAL N/A 07/29/2013   Procedure: REMOVAL OF ARCH BARS;  Surgeon: Theodoro Kos, DO;  Location: [place];  Service: Plastics;  Laterality: N/A;  . ORIF MANDIBULAR FRACTURE N/A 06/05/2013   Procedure: REPAIR OF MANDIBULAR FRACTURE x 2 with maxillo-mandibular fixation ;  Surgeon: Theodoro Kos, DO;  Location: [place];  Service: Plastics;  Laterality: N/A;  . PERIPHERAL ARTERIAL STENT GRAFT Left     Prior to Admission medications   Medication Sig Start Date End Date Taking? Authorizing Provider  albuterol (PROVENTIL HFA;VENTOLIN HFA) 108 (90 Base) MCG/ACT inhaler Inhale 4-6 puffs by mouth every 4 hours as needed for wheezing, cough, and/or shortness of breath 07/11/16   Hinda Kehr, MD  aspirin EC 81 MG tablet Take 81 mg by mouth daily.    [provider]  atorvastatin (LIPITOR) 40 MG tablet Take 40 mg by mouth daily. 04/19/16   [provider]  budesonide-formoterol (SYMBICORT) 160-4.5 MCG/ACT inhaler Inhale 2 puffs into the lungs daily.    [provider]  furosemide (LASIX) 80 MG tablet Take 80 mg by mouth daily. 05/11/16   [provider]  gabapentin (NEURONTIN) 300 MG capsule Take 1 capsule (300 mg total) by mouth daily. Patient taking differently: Take  300 mg by mouth at bedtime.  03/27/15   Bettey Costa, MD  labetalol (NORMODYNE) 100 MG tablet Take 1 tablet (100 mg total) by mouth daily. 01/22/16   Epifanio Lesches, MD  Multiple Vitamins-Minerals-FA (DIALYVITE SUPREME D) 3 MG TABS Take 1 tablet by mouth daily. 05/27/16   [provider]  nitroGLYCERIN (NITROSTAT) 0.4 MG SL tablet Place 1 tablet (0.4 mg total) under the tongue every 5 (five) minutes as needed. 04/12/16   Wende Bushy, MD  pantoprazole (PROTONIX) 40 MG tablet Take 1 tablet (40 mg total) by mouth daily. 05/10/16   Ghimire, Henreitta Leber, MD  sevelamer carbonate (RENVELA) 800 MG tablet Take 3 tablets (2,400 mg total) by mouth 3 (three) times daily with meals. 06/01/16    Theodoro Grist, MD    No Known Allergies  Family History  Problem Relation Age of Onset  . Colon cancer Mother   . Cancer Father   . Cancer Sister   . Kidney disease Brother     Social History Social History  Substance Use Topics  . Smoking status: Current Every Day Smoker    Packs/day: 0.15    Years: 40.00    Types: Cigarettes  . Smokeless tobacco: Never Used  . Alcohol use No     Comment: pt reports quitting after learning about cirrhosis    Review of Systems  Constitutional: Negative for fever. Eyes: Negative for visual changes. ENT: Negative for sore throat. Cardiovascular: Negative for chest pain. Respiratory: Positive for shortness of breath. Gastrointestinal: Negative for abdominal pain, vomiting and diarrhea. Genitourinary: Negative for dysuria. Musculoskeletal: Negative for back pain. Skin: Negative for rash. Neurological: Negative for headache.  ____________________________________________   PHYSICAL EXAM:  VITAL SIGNS: ED Triage Vitals  Enc Vitals Group     BP 10/29/16 1249 135/78     Pulse Rate 10/29/16 1249 94     Resp 10/29/16 1249 18     Temp 10/29/16 1249 99.8 F (37.7 C)     Temp Source 10/29/16 1249 Oral     SpO2 10/29/16 1249 97 %     Weight 10/29/16 1250 180 lb (81.6 kg)     Height 10/29/16 1250 6\' 3"  (1.905 m)     Head Circumference --      Peak Flow --      Pain Score 10/29/16 1249 0     Pain Loc --      Pain Edu? --      Excl. in [place]? --      Constitutional: Sleeping when I went in the room, but alert to voice. He does seem a little sleepy, and is a bit of a poor historian but is able to tell me as much.. Well appearing and in no distress. HEENT   Head: Normocephalic and atraumatic.      Eyes: Conjunctivae are normal. Pupils equal and round.       Ears:         Nose: No congestion/rhinnorhea.   Mouth/Throat: Mucous membranes are moist.   Neck: No stridor. Cardiovascular/Chest: Normal rate, regular rhythm.  No  murmurs, rubs, or gallops. Respiratory: Normal respiratory effort without tachypnea nor retractions. Somewhat tight breath sounds with moderate wheezing. Gastrointestinal: Soft. No distention, no guarding, no rebound. Nontender.    Genitourinary/rectal:Deferred Musculoskeletal: Nontender with normal range of motion in all extremities. No joint effusions.  No lower extremity tenderness. Left lower extremity pitting edema bilaterally. Neurologic:  Normal speech and language. No gross or focal neurologic deficits are appreciated. Skin:  Skin is warm, dry and intact. No rash noted. Psychiatric: Mood and affect are normal. Speech and behavior are normal. Patient exhibits appropriate insight and judgment.   ____________________________________________  LABS (pertinent positives/negatives)  Labs Reviewed  BASIC METABOLIC PANEL - Abnormal; Notable for the following:       Result Value   Potassium 6.0 (*)    Chloride 96 (*)    Glucose, Bld 107 (*)    BUN 31 (*)    Creatinine, Ser 7.40 (*)    GFR calc non Af Amer 7 (*)    GFR calc Af Amer 8 (*)    All other components within normal limits  CBC - Abnormal; Notable for the following:    RBC 3.99 (*)    Hemoglobin 9.7 (*)    HCT 32.0 (*)    MCH 24.3 (*)    MCHC 30.3 (*)    RDW 18.7 (*)    All other components within normal limits  TROPONIN I - Abnormal; Notable for the following:    Troponin I 0.06 (*)    All other components within normal limits    ____________________________________________    EKG I, Lisa Roca, MD, the attending physician have personally viewed and interpreted all ECGs.  92 beats. Normal sinus rhythm.  Respiratory normal axis. Nonspecific ST-T wave ____________________________________________  RADIOLOGY All Xrays were viewed by me. Imaging interpreted by Radiologist.  CXR: IMPRESSION: Cardiomegaly with mild pulmonary vascular  congestion. __________________________________________  PROCEDURES  Procedure(s) performed: None  Critical Care performed: None  ____________________________________________   ED COURSE / ASSESSMENT AND PLAN  Pertinent labs & imaging results that were available during my care of the patient were reviewed by me and considered in my medical decision making (see chart for details).   Mr. Frith has COPD and is out of his inhalers, and he is wheezing, and hypoxic to 93% on room air at rest. Patient given DuoNeb here as well as Solu-Medrol. He is also a dialysis patient who appears to be somewhat volume overloaded, and is also showing hyperkalemia. Will admit to the hospitalist service. I spoke with nephrology, Dr. Marita Snellen, for inpatient dialysis.     CONSULTATIONS:   Nephrologist, will prepare for inpatient dialysis.  Hospitalist for admission.   Patient / Family / Caregiver informed of clinical course, medical decision-making process, and agree with plan.   ___________________________________________   FINAL CLINICAL IMPRESSION(S) / ED DIAGNOSES   Final diagnoses:  Acute renal failure superimposed on chronic kidney disease, on chronic dialysis, unspecified acute renal failure type ([place])  Hyperkalemia  Hypoxia              Note: This dictation was prepared with Dragon dictation. Any transcriptional errors that result from this process are unintentional    Lisa Roca, MD 10/29/16 1358

## 2016-10-30 DIAGNOSIS — F17200 Nicotine dependence, unspecified, uncomplicated: Secondary | ICD-10-CM | POA: Diagnosis not present

## 2016-10-30 DIAGNOSIS — Z716 Tobacco abuse counseling: Secondary | ICD-10-CM | POA: Diagnosis not present

## 2016-10-30 DIAGNOSIS — E875 Hyperkalemia: Secondary | ICD-10-CM | POA: Diagnosis not present

## 2016-10-30 DIAGNOSIS — I120 Hypertensive chronic kidney disease with stage 5 chronic kidney disease or end stage renal disease: Secondary | ICD-10-CM | POA: Diagnosis not present

## 2016-10-30 DIAGNOSIS — N186 End stage renal disease: Secondary | ICD-10-CM | POA: Diagnosis not present

## 2016-10-30 DIAGNOSIS — R748 Abnormal levels of other serum enzymes: Secondary | ICD-10-CM | POA: Diagnosis not present

## 2016-10-30 DIAGNOSIS — J449 Chronic obstructive pulmonary disease, unspecified: Secondary | ICD-10-CM | POA: Diagnosis present

## 2016-10-30 DIAGNOSIS — J441 Chronic obstructive pulmonary disease with (acute) exacerbation: Secondary | ICD-10-CM | POA: Diagnosis not present

## 2016-10-30 DIAGNOSIS — D631 Anemia in chronic kidney disease: Secondary | ICD-10-CM | POA: Diagnosis not present

## 2016-10-30 DIAGNOSIS — N2581 Secondary hyperparathyroidism of renal origin: Secondary | ICD-10-CM | POA: Diagnosis not present

## 2016-10-30 LAB — BASIC METABOLIC PANEL
Anion gap: 13 (ref 5–15)
BUN: 29 mg/dL — AB (ref 6–20)
CHLORIDE: 99 mmol/L — AB (ref 101–111)
CO2: 33 mmol/L — AB (ref 22–32)
CREATININE: 6.79 mg/dL — AB (ref 0.61–1.24)
Calcium: 8.5 mg/dL — ABNORMAL LOW (ref 8.9–10.3)
GFR calc Af Amer: 9 mL/min — ABNORMAL LOW (ref >60)
GFR calc non Af Amer: 8 mL/min — ABNORMAL LOW (ref >60)
GLUCOSE: 113 mg/dL — AB (ref 65–99)
POTASSIUM: 4.6 mmol/L (ref 3.5–5.1)
Sodium: 145 mmol/L (ref 135–145)

## 2016-10-30 LAB — CBC
HEMATOCRIT: 29.3 % — AB (ref 40.0–52.0)
Hemoglobin: 8.8 g/dL — ABNORMAL LOW (ref 13.0–18.0)
MCH: 24 pg — AB (ref 26.0–34.0)
MCHC: 30.2 g/dL — AB (ref 32.0–36.0)
MCV: 79.7 fL — AB (ref 80.0–100.0)
PLATELETS: 276 10*3/uL (ref 150–440)
RBC: 3.67 MIL/uL — ABNORMAL LOW (ref 4.40–5.90)
RDW: 18.6 % — ABNORMAL HIGH (ref 11.5–14.5)
WBC: 10.1 10*3/uL (ref 3.8–10.6)

## 2016-10-30 MED ORDER — IPRATROPIUM-ALBUTEROL 0.5-2.5 (3) MG/3ML IN SOLN: 3.0000 mL | Freq: Four times a day (QID) | RESPIRATORY_TRACT | 0 refills | Status: DC | PRN

## 2016-10-30 MED ORDER — TIOTROPIUM BROMIDE MONOHYDRATE 18 MCG IN CAPS: 18.0000 ug | ORAL_CAPSULE | Freq: Every day | RESPIRATORY_TRACT | 2 refills | Status: DC

## 2016-10-30 MED ORDER — PREDNISONE 50 MG PO TABS: 50.0000 mg | ORAL_TABLET | Freq: Every day | ORAL | 0 refills | Status: AC

## 2016-10-30 MED ORDER — MORPHINE SULFATE (PF) 2 MG/ML IV SOLN
1.0000 mg | INTRAVENOUS | Status: DC | PRN
  Administered 2016-10-30: 1 mg via INTRAVENOUS
  Filled 2016-10-30: qty 1

## 2016-10-30 NOTE — Progress Notes (Signed)
[facility]  ROUNDING NOTE   Subjective:   Hemodialysis yesterday. Tolerated treatment well. UF of 1577  Objective:  Vital signs in last 24 hours:  Temp:  [98.2 F (36.8 C)-99.8 F (37.7 C)] 98.2 F (36.8 C) (08/19 1100) Pulse Rate:  [88-97] 88 (08/19 1100) Resp:  [5-28] 18 (08/19 1100) BP: (118-156)/(65-98) 153/80 (08/19 1100) SpO2:  [91 %-100 %] 95 % (08/19 1100) Weight:  [80.7 kg (178 lb)-81.6 kg (180 lb)] 80.7 kg (178 lb) (08/18 1856)  Weight change:  Filed Weights   10/29/16 1250 10/29/16 1856  Weight: 81.6 kg (180 lb) 80.7 kg (178 lb)    Intake/Output: I/O last 3 completed shifts: In: 25 [IV Piggyback:45] Out: 9323 [Other:1577]   Intake/Output this shift:  Total I/O In: 500 [P.O.:500] Out: -   Physical Exam: General: No acute distress  Head: Normocephalic, atraumatic. Moist oral mucosal membranes  Eyes: Anicteric  Neck: Supple, trachea midline  Lungs:  Clear to auscultation, normal effort  Heart: S1S2 no rubs  Abdomen:  Soft, distended   Extremities: no peripheral edema.  Neurologic: Awake, alert, following commands  Skin: No lesions  Access: LUE AVF    Basic Metabolic Panel:  Recent Labs Lab 10/29/16 1254 10/30/16 0432  NA 141 145  K 6.0* 4.6  CL 96* 99*  CO2 30 33*  GLUCOSE 107* 113*  BUN 31* 29*  CREATININE 7.40* 6.79*  CALCIUM 9.1 8.5*    Liver Function Tests: No results for input(s): AST, ALT, ALKPHOS, BILITOT, PROT, ALBUMIN in the last 168 hours. No results for input(s): LIPASE, AMYLASE in the last 168 hours. No results for input(s): AMMONIA in the last 168 hours.  CBC:  Recent Labs Lab 10/29/16 1254 10/30/16 0432  WBC 8.3 10.1  HGB 9.7* 8.8*  HCT 32.0* 29.3*  MCV 80.2 79.7*  PLT 299 276    Cardiac Enzymes:  Recent Labs Lab 10/29/16 1254  TROPONINI 0.06*    BNP: Invalid input(s): POCBNP  CBG:  Recent Labs Lab 10/29/16 2143  GLUCAP 177*    Microbiology: Results for orders placed or  performed during the hospital encounter of 10/20/16  Body fluid culture     Status: None   Collection Time: 10/20/16  3:00 PM  Result Value Ref Range Status   Specimen Description PERITONEAL  Final   Special Requests NONE  Final   Gram Stain   Final    FEW WBC PRESENT, PREDOMINANTLY MONONUCLEAR NO ORGANISMS SEEN    Culture   Final    NO GROWTH 3 DAYS Performed at [facility], [place] [address] [place],  [address]    Report Status 10/24/2016 FINAL  Final  MRSA PCR Screening     Status: None   Collection Time: 10/20/16  5:31 PM  Result Value Ref Range Status   MRSA by PCR NEGATIVE NEGATIVE Final    Comment:        The GeneXpert MRSA Assay (FDA approved for NASAL specimens only), is one component of a comprehensive MRSA colonization surveillance program. It is not intended to diagnose MRSA infection nor to guide or monitor treatment for MRSA infections.     Coagulation Studies: No results for input(s): LABPROT, INR in the last 72 hours.  Urinalysis: No results for input(s): COLORURINE, LABSPEC, PHURINE, GLUCOSEU, HGBUR, BILIRUBINUR, KETONESUR, PROTEINUR, UROBILINOGEN, NITRITE, LEUKOCYTESUR in the last 72 hours.  Invalid input(s): APPERANCEUR    Imaging: Dg Chest 2 View  Result Date: 10/29/2016 CLINICAL DATA:  COPD.  Shortness of  breath. EXAM: CHEST  2 VIEW COMPARISON:  10/20/2016 FINDINGS: Bilateral diffuse interstitial thickening. No pleural effusion or pneumothorax. No focal consolidation. Stable cardiomediastinal silhouette. Prior CABG. No acute osseous abnormality. IMPRESSION: Cardiomegaly with mild pulmonary vascular congestion. Electronically Signed   By: Kathreen Devoid   On: 10/29/2016 13:45   Dg Foot 2 Views Left  Result Date: 10/29/2016 CLINICAL DATA:  Pain and swelling of the fifth digit. EXAM: LEFT FOOT - 2 VIEW COMPARISON:  None. FINDINGS: There is no evidence of fracture or dislocation. There is no evidence of arthropathy or other focal bone  abnormality. Mild soft tissue swelling of the mid and forefoot. Vascular calcifications noted. IMPRESSION: No acute fracture or dislocation identified about the left foot. Mild soft tissue swelling. Vascular calcifications. Electronically Signed   By: Fidela Salisbury M.D.   On: 10/29/2016 21:44     Medications:       Assessment/ Plan:  56 y.o. black male  with end stage renal disease on hemodialysis secondary to Alport's syndrome, end stage liver disease with hepatic cirrhosis and ascites, hypertension, anemia of chronic kidney disease, coronary artery disease, peripheral vascular disease, hyperlipidemia  [facility] [place] AVF  1. End Stage Renal Disease: dialysis treatment yesterday. Tolerated treatment well.  - Hemodialysis TTS schedule  2. Anemia of chronic kidney disease: hemoglobin 8.8 - mircera as an outpatient - Monitor closely with recent GI bleed.   3. Hypertension: blood pressure at goal.  - home regimen: labetalol and furosemide.   4. Secondary Hyperparathyroidism:  - Continue sevelamer 3 tabs with meals.    LOS: [place], Suzzanne Brunkhorst 8/19/201812:22 PM

## 2016-10-30 NOTE — Progress Notes (Signed)
Discharge Instructions reviewed with patient. Patient verbalized understanding. Patient given prescriptions with paperwork. Patient c/o some heartburn at discharge but refusing to wait for GI cocktail prior to departure. IV dc'ed, cath intact, dressing applied. Patient called a friend to pick him up, states that friend will pick him up at the ER lobby. Patient transported to ER lobby with belongings including cell phone via wheelchair by nurse. Patient refused to wait in room, states that he will wait down there for friend to pick him up.

## 2016-10-30 NOTE — Discharge Summary (Signed)
[facility] [place] NAME: Stanley [name]    MR#:  [id number]  DATE OF BIRTH:  04-24-1960  DATE OF ADMISSION:  10/29/2016 ADMITTING PHYSICIAN: Loletha Grayer, MD  DATE OF DISCHARGE: 10/30/2016  PRIMARY CARE PHYSICIAN: [name], MD    ADMISSION DIAGNOSIS:  Hyperkalemia [E87.5] Hypoxia [R09.02] Acute renal failure superimposed on chronic kidney disease, on chronic dialysis, unspecified acute renal failure type ([place]) [N17.9, N18.9, Z99.2]  DISCHARGE DIAGNOSIS:  Active Problems:   COPD exacerbation (HCC)   COPD (chronic obstructive pulmonary disease) ([place])   SECONDARY DIAGNOSIS:   Past Medical History:  Diagnosis Date  . Alcohol abuse   . Cirrhosis ([place])   . Coronary artery disease 2009  . Diabetic peripheral neuropathy ([place])   . Drug abuse   . End stage renal disease on dialysis [facility]) NEPHROLOGIST-   DR [facility]  IN [place]   HEMODIALYSIS --   TUES/  THURS/  SAT  . GERD (gastroesophageal reflux disease)   . Hyperlipidemia   . Hypertension   . PAD (peripheral artery disease) ([place])   . Renal insufficiency    Per pt, 32 oz fluid restriction per day  . S/P triple vessel bypass 06/09/2016   2009ish  . Suicidal ideation    & HOMICIDAL IDEATION --  06-16-2013   ADMITTED TO BEHAVIOR HEALTH    HOSPITAL COURSE:  56 year old male with history of liver cirrhosis, end-stage renal disease on hemodialysis and moderate/severe mitral regurgitation who presented shortness of breath.  1. Acute exacerbation of COPD: Patient symptoms are improved. He will be discharged on prednisone taper and nebulizer. He is also discharged with an inhaler.  2. ESRD and hemodialysis: Patient did have dialysis and will continue his outpatient Tuesday, Thursday and Saturday schedule.  3. Elevated troponin: Patient has ruled out for acute coronary syndrome. Elevation in troponins do to poor clearance from end-stage renal disease.  4.Tobacco  dependence: Patient is encouraged to quit smoking. Counseling was provided for 4 minutes. 5. Hyperlipidemia: Continue statin  6. Essential hypertension: Continue labetalol  7. History of liver cirrhosis and ascites  8. Moderate/severe mitral regurgitation:patient needs follow-up with Dr. Kandice Moos   DISCHARGE CONDITIONS AND DIET:  Stable Renal diet  CONSULTS OBTAINED:  Treatment Team:  Lavonia Dana, MD  DRUG ALLERGIES:  No Known Allergies  DISCHARGE MEDICATIONS:   Current Discharge Medication List    START taking these medications   Details  ipratropium-albuterol (DUONEB) 0.5-2.5 (3) MG/3ML SOLN Take 3 mLs by nebulization every 6 (six) hours as needed. Qty: 360 mL, Refills: 0    predniSONE (DELTASONE) 50 MG tablet Take 1 tablet (50 mg total) by mouth daily with breakfast. Qty: 4 tablet, Refills: 0    tiotropium (SPIRIVA HANDIHALER) 18 MCG inhalation capsule Place 1 capsule (18 mcg total) into inhaler and inhale daily. Qty: 30 capsule, Refills: 2      CONTINUE these medications which have NOT CHANGED   Details  aspirin EC 81 MG tablet Take 81 mg by mouth daily.    atorvastatin (LIPITOR) 40 MG tablet Take 40 mg by mouth daily. Refills: 11    budesonide-formoterol (SYMBICORT) 160-4.5 MCG/ACT inhaler Inhale 2 puffs into the lungs daily.    furosemide (LASIX) 80 MG tablet Take 80 mg by mouth daily. Refills: 11    labetalol (NORMODYNE) 100 MG tablet Take 1 tablet (100 mg total) by mouth daily. Qty: 30 tablet, Refills: 0    Multiple Vitamins-Minerals-FA (DIALYVITE SUPREME D) 3 MG TABS  Take 1 tablet by mouth daily. Refills: 11    pantoprazole (PROTONIX) 40 MG tablet Take 1 tablet (40 mg total) by mouth daily. Qty: 30 tablet, Refills: 0    sevelamer carbonate (RENVELA) 800 MG tablet Take 3 tablets (2,400 mg total) by mouth 3 (three) times daily with meals. Qty: 270 tablet, Refills: 1    albuterol (PROVENTIL HFA;VENTOLIN HFA) 108 (90 Base) MCG/ACT inhaler  Inhale 4-6 puffs by mouth every 4 hours as needed for wheezing, cough, and/or shortness of breath Qty: 1 Inhaler, Refills: 1    nitroGLYCERIN (NITROSTAT) 0.4 MG SL tablet Place 1 tablet (0.4 mg total) under the tongue every 5 (five) minutes as needed. Qty: 25 tablet, Refills: 6      STOP taking these medications     gabapentin (NEURONTIN) 300 MG capsule           Today   CHIEF COMPLAINT:  No SOB   VITAL SIGNS:  Blood pressure 118/71, pulse 92, temperature 98.3 F (36.8 C), temperature source Oral, resp. rate 16, height 6\' 3"  (1.905 m), weight 80.7 kg (178 lb), SpO2 99 %.   REVIEW OF SYSTEMS:  Review of Systems  Constitutional: Negative.  Negative for chills, fever and malaise/fatigue.  HENT: Negative.  Negative for ear discharge, ear pain, hearing loss, nosebleeds and sore throat.   Eyes: Negative.  Negative for blurred vision and pain.  Respiratory: Negative.  Negative for cough, hemoptysis, shortness of breath and wheezing.   Cardiovascular: Negative.  Negative for chest pain, palpitations and leg swelling.  Gastrointestinal: Negative.  Negative for abdominal pain, blood in stool, diarrhea, nausea and vomiting.  Genitourinary: Negative.  Negative for dysuria.  Musculoskeletal: Negative.  Negative for back pain.  Skin: Negative.   Neurological: Negative for dizziness, tremors, speech change, focal weakness, seizures and headaches.  Endo/Heme/Allergies: Negative.  Does not bruise/bleed easily.  Psychiatric/Behavioral: Negative.  Negative for depression, hallucinations and suicidal ideas.     PHYSICAL EXAMINATION:  GENERAL:  57 y.o.-year-old patient lying in the bed with no acute distress.  NECK:  Supple, no jugular venous distention. No thyroid enlargement, no tenderness.  LUNGS: Normal breath sounds bilaterally, no wheezing, rales,rhonchi  No use of accessory muscles of respiration.  CARDIOVASCULAR: S1, S2 normal. No murmurs, rubs, or gallops.  ABDOMEN: Soft,  non-tender, non-distended. Bowel sounds present. No organomegaly or mass.  EXTREMITIES: No pedal edema, cyanosis, or clubbing.  PSYCHIATRIC: The patient is alert and oriented x 3.  SKIN: No obvious rash, lesion, or ulcer.   DATA REVIEW:   CBC  Recent Labs Lab 10/30/16 0432  WBC 10.1  HGB 8.8*  HCT 29.3*  PLT 276    Chemistries   Recent Labs Lab 10/30/16 0432  NA 145  K 4.6  CL 99*  CO2 33*  GLUCOSE 113*  BUN 29*  CREATININE 6.79*  CALCIUM 8.5*    Cardiac Enzymes  Recent Labs Lab 10/29/16 1254  TROPONINI 0.06*    Microbiology Results  @[email or web address] @  RADIOLOGY:  Dg Chest 2 View  Result Date: 10/29/2016 CLINICAL DATA:  COPD.  Shortness of breath. EXAM: CHEST  2 VIEW COMPARISON:  10/20/2016 FINDINGS: Bilateral diffuse interstitial thickening. No pleural effusion or pneumothorax. No focal consolidation. Stable cardiomediastinal silhouette. Prior CABG. No acute osseous abnormality. IMPRESSION: Cardiomegaly with mild pulmonary vascular congestion. Electronically Signed   By: Kathreen Devoid   On: 10/29/2016 13:45   Dg Foot 2 Views Left  Result Date: 10/29/2016 CLINICAL DATA:  Pain and swelling of the fifth digit.  EXAM: LEFT FOOT - 2 VIEW COMPARISON:  None. FINDINGS: There is no evidence of fracture or dislocation. There is no evidence of arthropathy or other focal bone abnormality. Mild soft tissue swelling of the mid and forefoot. Vascular calcifications noted. IMPRESSION: No acute fracture or dislocation identified about the left foot. Mild soft tissue swelling. Vascular calcifications. Electronically Signed   By: Fidela Salisbury M.D.   On: 10/29/2016 21:44      Current Discharge Medication List    START taking these medications   Details  ipratropium-albuterol (DUONEB) 0.5-2.5 (3) MG/3ML SOLN Take 3 mLs by nebulization every 6 (six) hours as needed. Qty: 360 mL, Refills: 0    predniSONE (DELTASONE) 50 MG tablet Take 1 tablet (50 mg total) by mouth daily  with breakfast. Qty: 4 tablet, Refills: 0    tiotropium (SPIRIVA HANDIHALER) 18 MCG inhalation capsule Place 1 capsule (18 mcg total) into inhaler and inhale daily. Qty: 30 capsule, Refills: 2      CONTINUE these medications which have NOT CHANGED   Details  aspirin EC 81 MG tablet Take 81 mg by mouth daily.    atorvastatin (LIPITOR) 40 MG tablet Take 40 mg by mouth daily. Refills: 11    budesonide-formoterol (SYMBICORT) 160-4.5 MCG/ACT inhaler Inhale 2 puffs into the lungs daily.    furosemide (LASIX) 80 MG tablet Take 80 mg by mouth daily. Refills: 11    labetalol (NORMODYNE) 100 MG tablet Take 1 tablet (100 mg total) by mouth daily. Qty: 30 tablet, Refills: 0    Multiple Vitamins-Minerals-FA (DIALYVITE SUPREME D) 3 MG TABS Take 1 tablet by mouth daily. Refills: 11    pantoprazole (PROTONIX) 40 MG tablet Take 1 tablet (40 mg total) by mouth daily. Qty: 30 tablet, Refills: 0    sevelamer carbonate (RENVELA) 800 MG tablet Take 3 tablets (2,400 mg total) by mouth 3 (three) times daily with meals. Qty: 270 tablet, Refills: 1    albuterol (PROVENTIL HFA;VENTOLIN HFA) 108 (90 Base) MCG/ACT inhaler Inhale 4-6 puffs by mouth every 4 hours as needed for wheezing, cough, and/or shortness of breath Qty: 1 Inhaler, Refills: 1    nitroGLYCERIN (NITROSTAT) 0.4 MG SL tablet Place 1 tablet (0.4 mg total) under the tongue every 5 (five) minutes as needed. Qty: 25 tablet, Refills: 6      STOP taking these medications     gabapentin (NEURONTIN) 300 MG capsule           Management plans discussed with the patient and he is in agreement. Stable for discharge   Patient should follow up with pcp  CODE STATUS:     Code Status Orders        Start     Ordered   10/29/16 1454  Full code  Continuous     10/29/16 1453    Code Status History    Date Active Date Inactive Code Status Order ID Comments User Context   09/15/2016  5:57 PM 09/16/2016  6:17 PM Full Code [id number]  Dustin Flock, MD ED   08/16/2016  9:45 AM 08/18/2016  6:16 PM Full Code [id number]  Hillary Bow, MD ED   08/09/2016  4:59 PM 08/12/2016  8:34 PM Full Code [id number]  Fritzi Mandes, MD Inpatient   07/03/2016  9:54 PM 07/05/2016  9:22 PM Full Code [id number]  Demetrios Loll, MD Inpatient   06/24/2016  4:23 AM 06/26/2016  3:46 PM Full Code [id number]  Saundra Shelling, MD Inpatient   06/14/2016  2:53 PM 06/19/2016  9:04 PM Full Code [id number]  Hillary Bow, MD ED   05/31/2016  6:42 AM 06/01/2016  9:30 PM Full Code [id number]  Saundra Shelling, MD Inpatient   05/05/2016 11:09 PM 05/10/2016  9:57 PM Full Code [id number]  Vianne Bulls, MD Inpatient   04/16/2016  3:56 AM 04/17/2016  6:27 PM Full Code [id number]  Saundra Shelling, MD Inpatient   01/19/2016  3:13 PM 01/22/2016  2:14 PM Full Code [id number]  Loletha Grayer, MD ED   09/24/2015 10:33 AM 09/25/2015  1:31 PM Full Code [id number]  Loletha Grayer, MD ED   05/16/2015  2:17 PM 05/17/2015  8:57 PM Full Code [id number]  Idelle Crouch, MD Inpatient   04/30/2015  8:08 PM 05/08/2015  7:40 PM Full Code [id number]  Vaughan Basta, MD Inpatient   04/06/2015  5:07 AM 04/07/2015  5:36 PM Full Code [id number]  Lance Coon, MD Inpatient   03/23/2015 11:07 AM 03/27/2015 12:46 PM Full Code [id number]  Bettey Costa, MD Inpatient   06/19/2013 11:32 AM 06/21/2013  5:49 PM Full Code [id number]  Cristal Ford, DO Inpatient   06/16/2013  5:26 PM 06/19/2013 11:32 AM Full Code [id number]  Margarita Mail, PA-C ED   06/05/2013  3:33 PM 06/06/2013  6:56 PM Full Code [id number]  Theodoro Kos, DO Inpatient   06/02/2013  9:01 AM 06/05/2013  3:33 PM Full Code [id number]  Velvet Bathe, MD Inpatient   06/02/2013  8:40 AM 06/02/2013  9:01 AM Full Code [id number]  Velvet Bathe, MD Inpatient      TOTAL TIME TAKING CARE OF THIS PATIENT: 38 minutes.    Note: This dictation was prepared with Dragon dictation along with smaller phrase technology. Any transcriptional errors that result from this process are  unintentional.  Houa Nie M.D on 10/30/2016 at 10:26 AM  Between 7am to 6pm - Pager - [phone] After 6pm go to [email or web address] - password [email or web address] [facility]  Office  [phone]  CC: Primary care physician; [name], MD

## 2016-10-31 LAB — GLUCOSE, CAPILLARY: GLUCOSE-CAPILLARY: 118 mg/dL — AB (ref 65–99)

## 2016-11-01 DIAGNOSIS — N2581 Secondary hyperparathyroidism of renal origin: Secondary | ICD-10-CM | POA: Diagnosis not present

## 2016-11-01 DIAGNOSIS — N186 End stage renal disease: Secondary | ICD-10-CM | POA: Diagnosis not present

## 2016-11-01 DIAGNOSIS — D649 Anemia, unspecified: Secondary | ICD-10-CM | POA: Diagnosis not present

## 2016-11-05 DIAGNOSIS — D649 Anemia, unspecified: Secondary | ICD-10-CM | POA: Diagnosis not present

## 2016-11-05 DIAGNOSIS — N186 End stage renal disease: Secondary | ICD-10-CM | POA: Diagnosis not present

## 2016-11-05 DIAGNOSIS — N2581 Secondary hyperparathyroidism of renal origin: Secondary | ICD-10-CM | POA: Diagnosis not present

## 2016-11-08 DIAGNOSIS — N186 End stage renal disease: Secondary | ICD-10-CM | POA: Diagnosis not present

## 2016-11-08 DIAGNOSIS — D649 Anemia, unspecified: Secondary | ICD-10-CM | POA: Diagnosis not present

## 2016-11-08 DIAGNOSIS — N2581 Secondary hyperparathyroidism of renal origin: Secondary | ICD-10-CM | POA: Diagnosis not present

## 2016-11-10 ENCOUNTER — Encounter: Payer: Self-pay | Admitting: Gastroenterology

## 2016-11-10 ENCOUNTER — Ambulatory Visit: Payer: Medicare Other | Admitting: Gastroenterology

## 2016-11-10 DIAGNOSIS — D649 Anemia, unspecified: Secondary | ICD-10-CM | POA: Diagnosis not present

## 2016-11-10 DIAGNOSIS — N186 End stage renal disease: Secondary | ICD-10-CM | POA: Diagnosis not present

## 2016-11-10 DIAGNOSIS — N2581 Secondary hyperparathyroidism of renal origin: Secondary | ICD-10-CM | POA: Diagnosis not present

## 2016-11-11 DIAGNOSIS — N186 End stage renal disease: Secondary | ICD-10-CM | POA: Diagnosis not present

## 2016-11-11 DIAGNOSIS — Z992 Dependence on renal dialysis: Secondary | ICD-10-CM | POA: Diagnosis not present

## 2016-11-15 ENCOUNTER — Emergency Department: Payer: Medicare Other

## 2016-11-15 ENCOUNTER — Inpatient Hospital Stay
Admission: EM | Admit: 2016-11-15 | Discharge: 2016-11-16 | DRG: 640 | Disposition: A | Discharge status: Home / Self Care | Payer: Medicare Other | Attending: Internal Medicine | Admitting: Internal Medicine

## 2016-11-15 ENCOUNTER — Encounter: Payer: Self-pay | Admitting: *Deleted

## 2016-11-15 DIAGNOSIS — Z9115 Patient's noncompliance with renal dialysis: Secondary | ICD-10-CM

## 2016-11-15 DIAGNOSIS — E1142 Type 2 diabetes mellitus with diabetic polyneuropathy: Secondary | ICD-10-CM | POA: Diagnosis present

## 2016-11-15 DIAGNOSIS — Z951 Presence of aortocoronary bypass graft: Secondary | ICD-10-CM

## 2016-11-15 DIAGNOSIS — D631 Anemia in chronic kidney disease: Secondary | ICD-10-CM | POA: Diagnosis present

## 2016-11-15 DIAGNOSIS — K219 Gastro-esophageal reflux disease without esophagitis: Secondary | ICD-10-CM | POA: Diagnosis present

## 2016-11-15 DIAGNOSIS — E1151 Type 2 diabetes mellitus with diabetic peripheral angiopathy without gangrene: Secondary | ICD-10-CM | POA: Diagnosis present

## 2016-11-15 DIAGNOSIS — N186 End stage renal disease: Secondary | ICD-10-CM | POA: Diagnosis present

## 2016-11-15 DIAGNOSIS — Z841 Family history of disorders of kidney and ureter: Secondary | ICD-10-CM | POA: Diagnosis not present

## 2016-11-15 DIAGNOSIS — R0602 Shortness of breath: Secondary | ICD-10-CM | POA: Diagnosis not present

## 2016-11-15 DIAGNOSIS — E785 Hyperlipidemia, unspecified: Secondary | ICD-10-CM | POA: Diagnosis present

## 2016-11-15 DIAGNOSIS — Z7951 Long term (current) use of inhaled steroids: Secondary | ICD-10-CM

## 2016-11-15 DIAGNOSIS — F17210 Nicotine dependence, cigarettes, uncomplicated: Secondary | ICD-10-CM | POA: Diagnosis present

## 2016-11-15 DIAGNOSIS — K7290 Hepatic failure, unspecified without coma: Secondary | ICD-10-CM | POA: Diagnosis present

## 2016-11-15 DIAGNOSIS — Z7982 Long term (current) use of aspirin: Secondary | ICD-10-CM | POA: Diagnosis not present

## 2016-11-15 DIAGNOSIS — I2510 Atherosclerotic heart disease of native coronary artery without angina pectoris: Secondary | ICD-10-CM | POA: Diagnosis present

## 2016-11-15 DIAGNOSIS — Z79899 Other long term (current) drug therapy: Secondary | ICD-10-CM | POA: Diagnosis not present

## 2016-11-15 DIAGNOSIS — E1122 Type 2 diabetes mellitus with diabetic chronic kidney disease: Secondary | ICD-10-CM | POA: Diagnosis present

## 2016-11-15 DIAGNOSIS — R188 Other ascites: Secondary | ICD-10-CM | POA: Diagnosis not present

## 2016-11-15 DIAGNOSIS — K7460 Unspecified cirrhosis of liver: Secondary | ICD-10-CM | POA: Diagnosis not present

## 2016-11-15 DIAGNOSIS — J449 Chronic obstructive pulmonary disease, unspecified: Secondary | ICD-10-CM | POA: Diagnosis present

## 2016-11-15 DIAGNOSIS — I120 Hypertensive chronic kidney disease with stage 5 chronic kidney disease or end stage renal disease: Secondary | ICD-10-CM | POA: Diagnosis not present

## 2016-11-15 DIAGNOSIS — N2581 Secondary hyperparathyroidism of renal origin: Secondary | ICD-10-CM | POA: Diagnosis present

## 2016-11-15 DIAGNOSIS — E875 Hyperkalemia: Secondary | ICD-10-CM | POA: Diagnosis not present

## 2016-11-15 DIAGNOSIS — Z9114 Patient's other noncompliance with medication regimen: Secondary | ICD-10-CM | POA: Diagnosis not present

## 2016-11-15 DIAGNOSIS — Z9119 Patient's noncompliance with other medical treatment and regimen: Secondary | ICD-10-CM | POA: Diagnosis not present

## 2016-11-15 DIAGNOSIS — Z992 Dependence on renal dialysis: Secondary | ICD-10-CM | POA: Diagnosis not present

## 2016-11-15 DIAGNOSIS — I10 Essential (primary) hypertension: Secondary | ICD-10-CM | POA: Diagnosis not present

## 2016-11-15 DIAGNOSIS — K7031 Alcoholic cirrhosis of liver with ascites: Secondary | ICD-10-CM | POA: Diagnosis present

## 2016-11-15 LAB — BASIC METABOLIC PANEL
Anion gap: 16 — ABNORMAL HIGH (ref 5–15)
BUN: 60 mg/dL — AB (ref 6–20)
CALCIUM: 8.6 mg/dL — AB (ref 8.9–10.3)
CO2: 27 mmol/L (ref 22–32)
CREATININE: 11.33 mg/dL — AB (ref 0.61–1.24)
Chloride: 95 mmol/L — ABNORMAL LOW (ref 101–111)
GFR calc Af Amer: 5 mL/min — ABNORMAL LOW (ref >60)
GFR calc non Af Amer: 4 mL/min — ABNORMAL LOW (ref >60)
GLUCOSE: 98 mg/dL (ref 65–99)
Potassium: 6.9 mmol/L (ref 3.5–5.1)
Sodium: 138 mmol/L (ref 135–145)

## 2016-11-15 LAB — HEPATIC FUNCTION PANEL
ALT: 19 U/L (ref 17–63)
AST: 28 U/L (ref 15–41)
Albumin: 3.3 g/dL — ABNORMAL LOW (ref 3.5–5.0)
Alkaline Phosphatase: 130 U/L — ABNORMAL HIGH (ref 38–126)
Total Bilirubin: 0.8 mg/dL (ref 0.3–1.2)
Total Protein: 7.5 g/dL (ref 6.5–8.1)

## 2016-11-15 LAB — CBC
HCT: 31.8 % — ABNORMAL LOW (ref 40.0–52.0)
HEMOGLOBIN: 10 g/dL — AB (ref 13.0–18.0)
MCH: 23.8 pg — AB (ref 26.0–34.0)
MCHC: 31.4 g/dL — AB (ref 32.0–36.0)
MCV: 75.8 fL — ABNORMAL LOW (ref 80.0–100.0)
Platelets: 294 10*3/uL (ref 150–440)
RBC: 4.2 MIL/uL — ABNORMAL LOW (ref 4.40–5.90)
RDW: 19.1 % — ABNORMAL HIGH (ref 11.5–14.5)
WBC: 7.9 10*3/uL (ref 3.8–10.6)

## 2016-11-15 LAB — AMMONIA: Ammonia: 47 umol/L — ABNORMAL HIGH (ref 9–35)

## 2016-11-15 LAB — PHOSPHORUS: PHOSPHORUS: 8.4 mg/dL — AB (ref 2.5–4.6)

## 2016-11-15 LAB — MAGNESIUM: Magnesium: 2.6 mg/dL — ABNORMAL HIGH (ref 1.7–2.4)

## 2016-11-15 LAB — TROPONIN I: TROPONIN I: 0.07 ng/mL — AB (ref <0.03)

## 2016-11-15 MED ORDER — ASPIRIN EC 81 MG PO TBEC
81.0000 mg | DELAYED_RELEASE_TABLET | Freq: Every day | ORAL | Status: DC
  Administered 2016-11-15 – 2016-11-16 (×2): 81 mg via ORAL
  Filled 2016-11-15 (×2): qty 1

## 2016-11-15 MED ORDER — PANTOPRAZOLE SODIUM 40 MG PO TBEC
40.0000 mg | DELAYED_RELEASE_TABLET | Freq: Every day | ORAL | Status: DC
  Administered 2016-11-15 – 2016-11-16 (×2): 40 mg via ORAL
  Filled 2016-11-15 (×2): qty 1

## 2016-11-15 MED ORDER — IPRATROPIUM-ALBUTEROL 0.5-2.5 (3) MG/3ML IN SOLN: 3.0000 mL | Freq: Four times a day (QID) | RESPIRATORY_TRACT | Status: DC | PRN

## 2016-11-15 MED ORDER — ONDANSETRON HCL 4 MG/2ML IJ SOLN
4.0000 mg | Freq: Four times a day (QID) | INTRAMUSCULAR | Status: DC | PRN
Start: 2016-11-15 — End: 2016-11-16
  Administered 2016-11-15: 4 mg via INTRAVENOUS
  Filled 2016-11-15: qty 2

## 2016-11-15 MED ORDER — ACETAMINOPHEN 650 MG RE SUPP: 650.0000 mg | Freq: Four times a day (QID) | RECTAL | Status: DC | PRN

## 2016-11-15 MED ORDER — ACETAMINOPHEN 325 MG PO TABS: 650.0000 mg | ORAL_TABLET | Freq: Four times a day (QID) | ORAL | Status: DC | PRN

## 2016-11-15 MED ORDER — ATORVASTATIN CALCIUM 20 MG PO TABS
40.0000 mg | ORAL_TABLET | Freq: Every day | ORAL | Status: DC
  Administered 2016-11-15 – 2016-11-16 (×2): 40 mg via ORAL
  Filled 2016-11-15 (×2): qty 2

## 2016-11-15 MED ORDER — LABETALOL HCL 100 MG PO TABS
100.0000 mg | ORAL_TABLET | Freq: Every day | ORAL | Status: DC
  Administered 2016-11-15 – 2016-11-16 (×2): 100 mg via ORAL
  Filled 2016-11-15 (×2): qty 1

## 2016-11-15 MED ORDER — SENNA 8.6 MG PO TABS
1.0000 | ORAL_TABLET | Freq: Two times a day (BID) | ORAL | Status: DC
  Administered 2016-11-15 – 2016-11-16 (×2): 8.6 mg via ORAL
  Filled 2016-11-15 (×3): qty 1

## 2016-11-15 MED ORDER — TIOTROPIUM BROMIDE MONOHYDRATE 18 MCG IN CAPS
18.0000 ug | ORAL_CAPSULE | Freq: Every day | RESPIRATORY_TRACT | Status: DC
  Administered 2016-11-15 – 2016-11-16 (×2): 18 ug via RESPIRATORY_TRACT
  Filled 2016-11-15: qty 5

## 2016-11-15 MED ORDER — ONDANSETRON HCL 4 MG PO TABS: 4.0000 mg | ORAL_TABLET | Freq: Four times a day (QID) | ORAL | Status: DC | PRN

## 2016-11-15 MED ORDER — ALUM & MAG HYDROXIDE-SIMETH 200-200-20 MG/5ML PO SUSP
30.0000 mL | Freq: Once | ORAL | Status: AC
  Administered 2016-11-15: 30 mL via ORAL
  Filled 2016-11-15: qty 30

## 2016-11-15 MED ORDER — HEPARIN SODIUM (PORCINE) 5000 UNIT/ML IJ SOLN
5000.0000 [IU] | Freq: Three times a day (TID) | INTRAMUSCULAR | Status: DC
  Administered 2016-11-15 – 2016-11-16 (×2): 5000 [IU] via SUBCUTANEOUS
  Filled 2016-11-15 (×2): qty 1

## 2016-11-15 MED ORDER — POLYETHYLENE GLYCOL 3350 17 G PO PACK: 17.0000 g | PACK | Freq: Every day | ORAL | Status: DC | PRN

## 2016-11-15 MED ORDER — DIPHENHYDRAMINE HCL 25 MG PO TABS
25.0000 mg | ORAL_TABLET | Freq: Once | ORAL | Status: AC
  Administered 2016-11-15: 25 mg via ORAL
  Filled 2016-11-15: qty 1

## 2016-11-15 MED ORDER — GI COCKTAIL ~~LOC~~
30.0000 mL | Freq: Once | ORAL | Status: AC
  Administered 2016-11-15: 30 mL via ORAL
  Filled 2016-11-15: qty 30

## 2016-11-15 MED ORDER — SEVELAMER CARBONATE 800 MG PO TABS
2400.0000 mg | ORAL_TABLET | Freq: Three times a day (TID) | ORAL | Status: DC
  Administered 2016-11-16: 2400 mg via ORAL
  Filled 2016-11-15 (×2): qty 3

## 2016-11-15 MED ORDER — RENA-VITE PO TABS
1.0000 | ORAL_TABLET | Freq: Every day | ORAL | Status: DC
  Administered 2016-11-15 – 2016-11-16 (×2): 1 via ORAL
  Filled 2016-11-15 (×2): qty 1

## 2016-11-15 NOTE — ED Triage Notes (Signed)
Pt to ED from [facility] reporting SOB. Pt did not undergo treatment today and reports last dialysis was on Thursday. Pt also has hx of cirrhosis and reports having to have fluid removed from abd 3 weeks ago. abd is distended today with non pitting edema noted to both lower extremities. Pt has gained over 20 lbs per hospital records.

## 2016-11-15 NOTE — ED Notes (Signed)
Pt has returned from dialysis. Pt eating and drinking at this time. NAD noted at this time. PT remains SOB but denies increased difficulty breathing like before dialysis.

## 2016-11-15 NOTE — Progress Notes (Signed)
[facility]  ROUNDING NOTE   Subjective:   [name] admitted to [facility] on 11/15/2016 for sob  Found to have hyperkalemia Brought for emergent hemodialysis on 2K bath. Seen and examined on hemodialysis. Tolerating treatment well.   Objective:  Vital signs in last 24 hours:  Temp:  [98.2 F (36.8 C)] 98.2 F (36.8 C) (09/04 1415) Pulse Rate:  [84-90] 90 (09/04 1500) Resp:  [12-14] 13 (09/04 1500) BP: (130-152)/(84-99) 152/91 (09/04 1500) SpO2:  [96 %-100 %] 100 % (09/04 1500) Weight:  [89 kg (196 lb 4.8 oz)] 89 kg (196 lb 4.8 oz) (09/04 1153)  Weight change:  Filed Weights   11/15/16 1153  Weight: 89 kg (196 lb 4.8 oz)    Intake/Output: No intake/output data recorded.   Intake/Output this shift:  No intake/output data recorded.  Physical Exam: General: No acute distress  Head: Normocephalic, atraumatic. Moist oral mucosal membranes  Eyes: Anicteric  Neck: Supple, trachea midline  Lungs:  Clear to auscultation, normal effort  Heart: S1S2 no rubs  Abdomen:  Soft, distended   Extremities: no peripheral edema.  Neurologic: Awake, alert, following commands  Skin: No lesions  Access: LUE AVF    Basic Metabolic Panel:  Recent Labs Lab 11/15/16 1200  NA 138  K 6.9*  CL 95*  CO2 27  GLUCOSE 98  BUN 60*  CREATININE 11.33*  CALCIUM 8.6*  MG 2.6*  PHOS 8.4*    Liver Function Tests:  Recent Labs Lab 11/15/16 1200  AST 28  ALT 19  ALKPHOS 130*  BILITOT 0.8  PROT 7.5  ALBUMIN 3.3*   No results for input(s): LIPASE, AMYLASE in the last 168 hours.  Recent Labs Lab 11/15/16 1205  AMMONIA 47*    CBC:  Recent Labs Lab 11/15/16 1200  WBC 7.9  HGB 10.0*  HCT 31.8*  MCV 75.8*  PLT 294    Cardiac Enzymes:  Recent Labs Lab 11/15/16 1200  TROPONINI 0.07*    BNP: Invalid input(s): POCBNP  CBG: No results for input(s): GLUCAP in the last 168 hours.  Microbiology: Results for orders placed or performed during the  hospital encounter of 10/20/16  Body fluid culture     Status: None   Collection Time: 10/20/16  3:00 PM  Result Value Ref Range Status   Specimen Description PERITONEAL  Final   Special Requests NONE  Final   Gram Stain   Final    FEW WBC PRESENT, PREDOMINANTLY MONONUCLEAR NO ORGANISMS SEEN    Culture   Final    NO GROWTH 3 DAYS Performed at [facility], [place] [address], [place], [place] [address]    Report Status 10/24/2016 FINAL  Final  MRSA PCR Screening     Status: None   Collection Time: 10/20/16  5:31 PM  Result Value Ref Range Status   MRSA by PCR NEGATIVE NEGATIVE Final    Comment:        The GeneXpert MRSA Assay (FDA approved for NASAL specimens only), is one component of a comprehensive MRSA colonization surveillance program. It is not intended to diagnose MRSA infection nor to guide or monitor treatment for MRSA infections.     Coagulation Studies: No results for input(s): LABPROT, INR in the last 72 hours.  Urinalysis: No results for input(s): COLORURINE, LABSPEC, PHURINE, GLUCOSEU, HGBUR, BILIRUBINUR, KETONESUR, PROTEINUR, UROBILINOGEN, NITRITE, LEUKOCYTESUR in the last 72 hours.  Invalid input(s): APPERANCEUR    Imaging: Dg Chest 2 View  Result Date: 11/15/2016 CLINICAL  DATA:  Shortness of breath.  On dialysis.  Cirrhosis. EXAM: CHEST  2 VIEW COMPARISON:  10/29/2016 FINDINGS: Mild hyperinflation. Median sternotomy for CABG. Midline trachea. Moderate cardiomegaly. Atherosclerosis in the transverse aorta. No pleural effusion or pneumothorax. Pulmonary interstitial prominence persists. IMPRESSION: Cardiomegaly with similar mild pulmonary venous congestion. Aortic Atherosclerosis (ICD10-I70.0). Electronically Signed   By: Abigail Miyamoto M.D.   On: 11/15/2016 12:32     Medications:       Assessment/ Plan:  56 y.o. black male  with end stage renal disease on hemodialysis secondary to Alport's syndrome, end stage liver disease with hepatic cirrhosis  and ascites, hypertension, anemia of chronic kidney disease, coronary artery disease, peripheral vascular disease, hyperlipidemia  [facility] TTS [facility] AVF  1. End Stage Renal Disease with hyperkalemia - seen and examined on hemodialysis. 2K bath - Hemodialysis TTS schedule  2. Anemia of chronic kidney disease: hemoglobin 10 - mircera as an outpatient - Monitor closely with recent GI bleed.   3. Hypertension: blood pressure at goal.  - home regimen: labetalol and furosemide.   4. Secondary Hyperparathyroidism:  - Continue sevelamer 3 tabs with meals.    LOS: 0 Stanley [name] 9/4/20183:15 PM

## 2016-11-15 NOTE — Care Management (Signed)
This RNCM received call from Dr. Fritzi Mandes requesting assistance with patient and discharge planning. Patient misses dialysis - this is his 9th presentation over 6 month period.  He left AMA on last admission a few weeks ago.  I have included CSW, APS for self-neglect and [place]ill [place] with patient pathways. CM consult placed.

## 2016-11-15 NOTE — H&P (Addendum)
[facility] [place] NAME: Stanley [name]    MR#:  [id number]  DATE OF BIRTH:  [birth year]  DATE OF ADMISSION:  11/15/2016  PRIMARY CARE PHYSICIAN: Alene Mires Elyse Jarvis, MD   REQUESTING/REFERRING PHYSICIAN: Dr Cherylann Banas  CHIEF COMPLAINT:   Increasing shortness of breath and abdominal distention. Patient missed dialysis on Saturday. HISTORY OF PRESENT ILLNESS:  Stanley [name]  is a 56 y.o. male with a known history of end-stage renal disease on hemodialysis, diabetes, coronary artery disease,alcoholic cirrhosis of liver with recurrent ascites last paracentesis 10/20/2016 was 6.7 L of fluid was removed. Patient comes to the emergency room with increasing shortness of breath and abdominal distention and tightness. Patient states he was very uncomfortable hence was not able to go to dialysis. Patient has tense ascites, elevated potassium without any EKG changes. Patient is on his way to get emergent hemodialysis secondary to severe hypokalemia due to missing dialysis on Saturday. He is being admitted for further nausea management of recurrent ascites.  PAST MEDICAL HISTORY:   Past Medical History:  Diagnosis Date  . Alcohol abuse   . Cirrhosis ([place])   . Coronary artery disease 2009  . Diabetic peripheral neuropathy ([place])   . Drug abuse   . End stage renal disease on dialysis [facility]) NEPHROLOGIST-   DR [facility]  IN [place]   HEMODIALYSIS --   TUES/  THURS/  SAT  . GERD (gastroesophageal reflux disease)   . Hyperlipidemia   . Hypertension   . PAD (peripheral artery disease) ([place])   . Renal insufficiency    Per pt, 32 oz fluid restriction per day  . S/P triple vessel bypass 06/09/2016   2009ish  . Suicidal ideation    & HOMICIDAL IDEATION --  06-16-2013   ADMITTED TO BEHAVIOR HEALTH    PAST SURGICAL HISTOIRY:   Past Surgical History:  Procedure Laterality Date  . AGILE CAPSULE N/A 06/19/2016   Procedure: AGILE CAPSULE;  Surgeon:  [name], MD;  Location[facility] ENDOSCOPY;  Service: Endoscopy;  Laterality: N/A;  . COLONOSCOPY WITH PROPOFOL N/A 06/18/2016   Procedure: COLONOSCOPY WITH PROPOFOL;  Surgeon: [name], MD;  Location: [facility] ENDOSCOPY;  Service: Endoscopy;  Laterality: N/A;  . COLONOSCOPY WITH PROPOFOL N/A 08/12/2016   Procedure: COLONOSCOPY WITH PROPOFOL;  Surgeon: [name], MD;  Location: [facility] ENDOSCOPY;  Service: Endoscopy;  Laterality: N/A;  . CORONARY ANGIOPLASTY  ?   PT UNABLE TO TELL IF  BEFORE OR AFTER  CABG  . CORONARY ARTERY BYPASS GRAFT  2008  ([place] , [place])   3 VESSEL  . DIALYSIS FISTULA CREATION  LAST SURGERY  APPOX  2008  . ENTEROSCOPY N/A 05/10/2016   Procedure: ENTEROSCOPY;  Surgeon: Jerene Bears, MD;  Location: [place];  Service: Gastroenterology;  Laterality: N/A;  . ENTEROSCOPY N/A 08/12/2016   Procedure: ENTEROSCOPY;  Surgeon: [name], MD;  Location: [facility] ENDOSCOPY;  Service: Endoscopy;  Laterality: N/A;  . ESOPHAGOGASTRODUODENOSCOPY N/A 05/07/2015   Procedure: ESOPHAGOGASTRODUODENOSCOPY (EGD);  Surgeon: Hulen Luster, MD;  Location: [facility] ENDOSCOPY;  Service: Endoscopy;  Laterality: N/A;  . ESOPHAGOGASTRODUODENOSCOPY (EGD) WITH PROPOFOL N/A 05/17/2015   Procedure: ESOPHAGOGASTRODUODENOSCOPY (EGD) WITH PROPOFOL;  Surgeon: [name], MD;  Location: [facility] ENDOSCOPY;  Service: Endoscopy;  Laterality: N/A;  . ESOPHAGOGASTRODUODENOSCOPY (EGD) WITH PROPOFOL N/A 01/20/2016   Procedure: ESOPHAGOGASTRODUODENOSCOPY (EGD) WITH PROPOFOL;  Surgeon: [name], MD;  Location: [facility] ENDOSCOPY;  Service: Endoscopy;  Laterality: N/A;  . ESOPHAGOGASTRODUODENOSCOPY (EGD) WITH PROPOFOL N/A 04/17/2016  Procedure: ESOPHAGOGASTRODUODENOSCOPY (EGD) WITH PROPOFOL;  Surgeon: Lin Landsman, MD;  Location: [facility] ENDOSCOPY;  Service: Gastroenterology;  Laterality: N/A;  . ESOPHAGOGASTRODUODENOSCOPY (EGD) WITH PROPOFOL  05/09/2016   Procedure: ESOPHAGOGASTRODUODENOSCOPY (EGD) WITH PROPOFOL;  Surgeon: Jerene Bears, MD;   Location: [place];  Service: Endoscopy;;  . ESOPHAGOGASTRODUODENOSCOPY (EGD) WITH PROPOFOL N/A 06/16/2016   Procedure: ESOPHAGOGASTRODUODENOSCOPY (EGD) WITH PROPOFOL;  Surgeon: [name], MD;  Location: [facility] ENDOSCOPY;  Service: Endoscopy;  Laterality: N/A;  . GIVENS CAPSULE STUDY N/A 05/07/2016   Procedure: GIVENS CAPSULE STUDY;  Surgeon: Doran Stabler, MD;  Location: [place];  Service: Endoscopy;  Laterality: N/A;  . MANDIBULAR HARDWARE REMOVAL N/A 07/29/2013   Procedure: REMOVAL OF ARCH BARS;  Surgeon: Theodoro Kos, DO;  Location: [place];  Service: Plastics;  Laterality: N/A;  . ORIF MANDIBULAR FRACTURE N/A 06/05/2013   Procedure: REPAIR OF MANDIBULAR FRACTURE x 2 with maxillo-mandibular fixation ;  Surgeon: Theodoro Kos, DO;  Location: [place];  Service: Plastics;  Laterality: N/A;  . PERIPHERAL ARTERIAL STENT GRAFT Left     SOCIAL HISTORY:   Social History  Substance Use Topics  . Smoking status: Current Every Day Smoker    Packs/day: 0.15    Years: 40.00    Types: Cigarettes  . Smokeless tobacco: Never Used  . Alcohol use No     Comment: pt reports quitting after learning about cirrhosis    FAMILY HISTORY:   Family History  Problem Relation Age of Onset  . Colon cancer Mother   . Cancer Father   . Cancer Sister   . Kidney disease Brother     DRUG ALLERGIES:  No Known Allergies  REVIEW OF SYSTEMS:  Review of Systems  Constitutional: Negative for chills, fever and weight loss.  HENT: Negative for ear discharge, ear pain and nosebleeds.   Eyes: Negative for blurred vision, pain and discharge.  Respiratory: Positive for shortness of breath. Negative for sputum production, wheezing and stridor.   Cardiovascular: Negative for chest pain, palpitations, orthopnea and PND.  Gastrointestinal: Positive for abdominal pain. Negative for diarrhea, nausea and vomiting.  Genitourinary: Negative for frequency and urgency.  Musculoskeletal: Negative  for back pain and joint pain.  Neurological: Positive for weakness. Negative for sensory change, speech change and focal weakness.  Psychiatric/Behavioral: Negative for depression and hallucinations. The patient is not nervous/anxious.      MEDICATIONS AT HOME:   Prior to Admission medications   Medication Sig Start Date End Date Taking? Authorizing Provider  albuterol (PROVENTIL HFA;VENTOLIN HFA) 108 (90 Base) MCG/ACT inhaler Inhale 4-6 puffs by mouth every 4 hours as needed for wheezing, cough, and/or shortness of breath 07/11/16   Hinda Kehr, MD  aspirin EC 81 MG tablet Take 81 mg by mouth daily.    [provider]  atorvastatin (LIPITOR) 40 MG tablet Take 40 mg by mouth daily. 04/19/16   [provider]  budesonide-formoterol (SYMBICORT) 160-4.5 MCG/ACT inhaler Inhale 2 puffs into the lungs daily.    [provider]  furosemide (LASIX) 80 MG tablet Take 80 mg by mouth daily. 05/11/16   [provider]  gabapentin (NEURONTIN) 300 MG capsule TAKE 1 CAPSULE BY MOUTH EVERY EVENING AND 1 EXTRA CAP ON DIALYSIS DAYS AS NEEDED FOR NERVE PAIN 08/16/16   [provider]  ipratropium-albuterol (DUONEB) 0.5-2.5 (3) MG/3ML SOLN Take 3 mLs by nebulization every 6 (six) hours as needed. 10/30/16   Bettey Costa, MD  labetalol (NORMODYNE) 100 MG tablet Take 1  tablet (100 mg total) by mouth daily. 01/22/16   Epifanio Lesches, MD  levofloxacin (LEVAQUIN) 500 MG tablet TAKE 1 TABLET BY MOUTH AFTER DIALYSIS EVERY 48 HOURS FOR 3 MORE DOSES 08/12/16   [provider]  Multiple Vitamins-Minerals-FA (DIALYVITE SUPREME D) 3 MG TABS Take 1 tablet by mouth daily. 05/27/16   [provider]  nitroGLYCERIN (NITROSTAT) 0.4 MG SL tablet Place 1 tablet (0.4 mg total) under the tongue every 5 (five) minutes as needed. 04/12/16   Wende Bushy, MD  pantoprazole (PROTONIX) 40 MG tablet Take 1 tablet (40 mg total) by mouth daily. 05/10/16   Ghimire, Henreitta Leber, MD   predniSONE (DELTASONE) 10 MG tablet TAKE 4 TABLETS BY MOUTH DAILY FOR 3 DAYS 08/12/16   [provider]  sevelamer carbonate (RENVELA) 800 MG tablet Take 3 tablets (2,400 mg total) by mouth 3 (three) times daily with meals. 06/01/16   Theodoro Grist, MD  tiotropium (SPIRIVA HANDIHALER) 18 MCG inhalation capsule Place 1 capsule (18 mcg total) into inhaler and inhale daily. 10/30/16 10/30/17  Bettey Costa, MD      VITAL SIGNS:  Blood pressure (!) 139/99, pulse 85, temperature 98.2 F (36.8 C), temperature source Oral, resp. rate 14, height 6\' 3"  (1.905 m), weight 89 kg (196 lb 4.8 oz), SpO2 98 %.  PHYSICAL EXAMINATION:  GENERAL:  56 y.o.-year-old patient lying in the bed with no acute distress.  EYES: Pupils equal, round, reactive to light and accommodation. No scleral icterus. Extraocular muscles intact.  HEENT: Head atraumatic, normocephalic. Oropharynx and nasopharynx clear.  NECK:  Supple, no jugular venous distention. No thyroid enlargement, no tenderness.  LUNGS: Normal breath sounds bilaterally, no wheezing, rales,rhonchi or crepitation. No use of accessory muscles of respiration.  CARDIOVASCULAR: S1, S2 normal. No murmurs, rubs, or gallops.  ABDOMEN: Soft, nontender, severely distended.Asictes+++ Bowel sounds present  EXTREMITIES: ++ pedal edema, no cyanosis, or clubbing.  NEUROLOGIC: Cranial nerves II through XII are intact. Muscle strength 5/5 in all extremities. Sensation intact. Gait not checked.  PSYCHIATRIC: The patient is alert and oriented x 3.  SKIN: No obvious rash, lesion, or ulcer.   LABORATORY PANEL:   CBC  Recent Labs Lab 11/15/16 1200  WBC 7.9  HGB 10.0*  HCT 31.8*  PLT 294   ------------------------------------------------------------------------------------------------------------------  Chemistries   Recent Labs Lab 11/15/16 1200  NA 138  K 6.9*  CL 95*  CO2 27  GLUCOSE 98  BUN 60*  CREATININE 11.33*  CALCIUM 8.6*  MG 2.6*  AST 28  ALT  19  ALKPHOS 130*  BILITOT 0.8   ------------------------------------------------------------------------------------------------------------------  Cardiac Enzymes  Recent Labs Lab 11/15/16 1200  TROPONINI 0.07*   ------------------------------------------------------------------------------------------------------------------  RADIOLOGY:  Dg Chest 2 View  Result Date: 11/15/2016 CLINICAL DATA:  Shortness of breath.  On dialysis.  Cirrhosis. EXAM: CHEST  2 VIEW COMPARISON:  10/29/2016 FINDINGS: Mild hyperinflation. Median sternotomy for CABG. Midline trachea. Moderate cardiomegaly. Atherosclerosis in the transverse aorta. No pleural effusion or pneumothorax. Pulmonary interstitial prominence persists. IMPRESSION: Cardiomegaly with similar mild pulmonary venous congestion. Aortic Atherosclerosis (ICD10-I70.0). Electronically Signed   By: Abigail Miyamoto M.D.   On: 11/15/2016 12:32    EKG:  Normal sinus rhythm. No peaked T waves.  IMPRESSION AND PLAN:   Stanley [name]  is a 55 y.o. male with a known history of end-stage renal disease on hemodialysis, diabetes, coronary artery disease,alcoholic cirrhosis of liver with recurrent ascites last paracentesis 10/20/2016 was 6.7 L of fluid was removed. Patient comes to the emergency room  with increasing shortness of breath and abdominal distention and tightness. Patient states he was very uncomfortable hence was not able to go to dialysis.  1. Acute severe hyperkalemia secondary to missing hemodialysis on Saturday -Patient to undergo emergent hemodialysis. Dr. Juleen China is aware. ER M.D. Has notified him. -EKG no evidence of peaked T waves.  2. Recurrent ascites with history of alcoholic cirrhosis of liver -Patient will need ultrasound-guided paracentesis therapeutic -Last paracentesis as well as 10/20/2016--- 6.7 L of fluid was removed  3.Secondary hyperparathyroidism -Continue renvela  4. COPD with ongoing tobacco abuse--- no  exacerbation -Continue inhalers and nebulizers as needed  5.Anemia of chronic disease -hemoglobin stable. Patient has had extensive GI workup done in the past  6. Elevated ammonia due to cirrhosis of liver. -Patient is alert and oriented 3 -we'll give lactulose daily.  7. History of noncompliance to medications and follow-up -Patient has not been taking any of his medications. He reports to the pharmacy tech that he has not picked up and also tells of the pharmacy does not give him his meds even though the scripts were called in by physicians. Patient has been noncompliant with the paracentesis which has been set up by nephrology as outpatient. I have spoken with care management to see how we can help patient from above issues.  No family present in the ER. The patient is a full code   All the records are reviewed and case discussed with ED provider. Management plans discussed with the patient, family and they are in agreement.  CODE STATUS: full  TOTAL TIME TAKING CARE OF THIS PATIENT: *50* minutes.    Ascencion Coye M.D on 11/15/2016 at 2:04 PM  Between 7am to 6pm - Pager - [phone]  After 6pm go to [email or web address] - password [email or web address] [facility]  Office  [phone]  CC: Primary care physician; [name], MD

## 2016-11-15 NOTE — Progress Notes (Signed)
Pre HD  

## 2016-11-15 NOTE — Progress Notes (Signed)
HD initiated via R AVF without issue. Consent obtained. Patient currently requesting something for heartburn. Continue to monitor.

## 2016-11-15 NOTE — Progress Notes (Signed)
Pre hd 

## 2016-11-15 NOTE — Progress Notes (Signed)
Post hd assessment unchanged  

## 2016-11-15 NOTE — ED Provider Notes (Signed)
[facility] Emergency Department Provider Note ____________________________________________   First MD Initiated Contact with Patient 11/15/16 1158     (approximate)  I have reviewed the triage vital signs and the nursing notes.   HISTORY  Chief Complaint Shortness of Breath    HPI Stanley [name] is a 56 y.o. male history of end-stage renal disease on dialysis, COPD, liver cirrhosis who presents with worsening shortness breath over the last 3 weeks, gradual onset, worse today and was sent in from his urology appointment to the emergency department.  Patient states his last dialysis was 5 days ago on Thursday and he missed his Saturday dialysis.  He reports worsening abdominal distention over the last several weeks which she thinks is also contributing to the shortness of breath. He states he last had his abdomen drained 3 weeks ago.  patient reports vomiting 2 days ago but denies nausea currently or any diarrhea. No fever or chills, no change in his baseline cough. Patient reports increased swelling to his legs.  Past Medical History:  Diagnosis Date  . Alcohol abuse   . Cirrhosis ([place])   . Coronary artery disease 2009  . Diabetic peripheral neuropathy ([place])   . Drug abuse   . End stage renal disease on dialysis [facility]) NEPHROLOGIST-   DR [facility]  IN [place]   HEMODIALYSIS --   TUES/  THURS/  SAT  . GERD (gastroesophageal reflux disease)   . Hyperlipidemia   . Hypertension   . PAD (peripheral artery disease) ([place])   . Renal insufficiency    Per pt, 32 oz fluid restriction per day  . S/P triple vessel bypass 06/09/2016   2009ish  . Suicidal ideation    & HOMICIDAL IDEATION --  06-16-2013   ADMITTED TO BEHAVIOR HEALTH    Patient Active Problem List   Diagnosis Date Noted  . COPD (chronic obstructive pulmonary disease) ([place]) 10/30/2016  . COPD exacerbation ([place]) 10/29/2016  . Anemia   . Heme positive stool   . Ulceration of intestine   . Benign  neoplasm of transverse colon   . Acute gastrointestinal hemorrhage   . Esophageal candidiasis ([place])   . Angiodysplasia of intestinal tract   . Acute respiratory failure with hypoxia ([name]) 07/03/2016  . GI bleeding 06/24/2016  . Rectal bleeding 06/14/2016  . Anemia of chronic disease 06/01/2016  . MRSA carrier 06/01/2016  . Chronic renal failure 05/23/2016  . Ischemic heart disease 05/23/2016  . Angiodysplasia of small intestine   . Melena   . Small bowel bleed not requiring more than 4 units of blood in 24 hours, ICU, or surgery   . Anemia due to chronic blood loss   . Abdominal pain 05/05/2016  . Acute posthemorrhagic anemia 04/17/2016  . Gastrointestinal bleed 04/17/2016  . History of esophagogastroduodenoscopy (EGD) 04/17/2016  . Elevated troponin 04/17/2016  . Alcohol abuse 04/17/2016  . Upper GI bleed 01/19/2016  . Blood in stool   . Angiodysplasia of stomach and duodenum with hemorrhage   . Gastritis   . Esophagitis, unspecified   . GI bleed 05/16/2015  . Acute GI bleeding   . Symptomatic anemia 04/30/2015  . HTN (hypertension) 04/06/2015  . GERD (gastroesophageal reflux disease) 04/06/2015  . HLD (hyperlipidemia) 04/06/2015  . Dyspnea 04/06/2015  . Cirrhosis of liver with ascites ([place]) 04/06/2015  . Ascites 04/06/2015  . GIB (gastrointestinal bleeding) 03/23/2015  . Homicidal ideation 06/19/2013  . Suicidal intent 06/19/2013  . Homicidal ideations 06/19/2013  . Hyperkalemia 06/16/2013  .  Mandible fracture ([place]) 06/05/2013  . Fracture, mandible ([place]) 06/02/2013  . Coronary atherosclerosis of native coronary artery 06/02/2013  . ESRD on dialysis ([place]) 06/02/2013  . Mandible open fracture ([place]) 06/02/2013    Past Surgical History:  Procedure Laterality Date  . AGILE CAPSULE N/A 06/19/2016   Procedure: AGILE CAPSULE;  Surgeon: [name], MD;  Location: [facility] ENDOSCOPY;  Service: Endoscopy;  Laterality: N/A;  . COLONOSCOPY WITH PROPOFOL N/A 06/18/2016   Procedure:  COLONOSCOPY WITH PROPOFOL;  Surgeon: [name], MD;  Location: [facility] ENDOSCOPY;  Service: Endoscopy;  Laterality: N/A;  . COLONOSCOPY WITH PROPOFOL N/A 08/12/2016   Procedure: COLONOSCOPY WITH PROPOFOL;  Surgeon: [name], MD;  Location: [facility] ENDOSCOPY;  Service: Endoscopy;  Laterality: N/A;  . CORONARY ANGIOPLASTY  ?   PT UNABLE TO TELL IF  BEFORE OR AFTER  CABG  . CORONARY ARTERY BYPASS GRAFT  2008  ([place] , [place])   3 VESSEL  . DIALYSIS FISTULA CREATION  LAST SURGERY  APPOX  2008  . ENTEROSCOPY N/A 05/10/2016   Procedure: ENTEROSCOPY;  Surgeon: Jerene Bears, MD;  Location: [place];  Service: Gastroenterology;  Laterality: N/A;  . ENTEROSCOPY N/A 08/12/2016   Procedure: ENTEROSCOPY;  Surgeon: [name], MD;  Location: [facility] ENDOSCOPY;  Service: Endoscopy;  Laterality: N/A;  . ESOPHAGOGASTRODUODENOSCOPY N/A 05/07/2015   Procedure: ESOPHAGOGASTRODUODENOSCOPY (EGD);  Surgeon: Hulen Luster, MD;  Location[facility] ENDOSCOPY;  Service: Endoscopy;  Laterality: N/A;  . ESOPHAGOGASTRODUODENOSCOPY (EGD) WITH PROPOFOL N/A 05/17/2015   Procedure: ESOPHAGOGASTRODUODENOSCOPY (EGD) WITH PROPOFOL;  Surgeon: [name], MD;  Location: [facility] ENDOSCOPY;  Service: Endoscopy;  Laterality: N/A;  . ESOPHAGOGASTRODUODENOSCOPY (EGD) WITH PROPOFOL N/A 01/20/2016   Procedure: ESOPHAGOGASTRODUODENOSCOPY (EGD) WITH PROPOFOL;  Surgeon: [name], MD;  Location: [facility] ENDOSCOPY;  Service: Endoscopy;  Laterality: N/A;  . ESOPHAGOGASTRODUODENOSCOPY (EGD) WITH PROPOFOL N/A 04/17/2016   Procedure: ESOPHAGOGASTRODUODENOSCOPY (EGD) WITH PROPOFOL;  Surgeon: Lin Landsman, MD;  Location: [facility] ENDOSCOPY;  Service: Gastroenterology;  Laterality: N/A;  . ESOPHAGOGASTRODUODENOSCOPY (EGD) WITH PROPOFOL  05/09/2016   Procedure: ESOPHAGOGASTRODUODENOSCOPY (EGD) WITH PROPOFOL;  Surgeon: Jerene Bears, MD;  Location: [place];  Service: Endoscopy;;  . ESOPHAGOGASTRODUODENOSCOPY (EGD) WITH PROPOFOL N/A 06/16/2016   Procedure:  ESOPHAGOGASTRODUODENOSCOPY (EGD) WITH PROPOFOL;  Surgeon: [name], MD;  Location: [facility] ENDOSCOPY;  Service: Endoscopy;  Laterality: N/A;  . GIVENS CAPSULE STUDY N/A 05/07/2016   Procedure: GIVENS CAPSULE STUDY;  Surgeon: Doran Stabler, MD;  Location: [place];  Service: Endoscopy;  Laterality: N/A;  . MANDIBULAR HARDWARE REMOVAL N/A 07/29/2013   Procedure: REMOVAL OF ARCH BARS;  Surgeon: Theodoro Kos, DO;  Location: [place];  Service: Plastics;  Laterality: N/A;  . ORIF MANDIBULAR FRACTURE N/A 06/05/2013   Procedure: REPAIR OF MANDIBULAR FRACTURE x 2 with maxillo-mandibular fixation ;  Surgeon: Theodoro Kos, DO;  Location: [place];  Service: Plastics;  Laterality: N/A;  . PERIPHERAL ARTERIAL STENT GRAFT Left     Prior to Admission medications   Medication Sig Start Date End Date Taking? Authorizing Provider  aspirin EC 81 MG tablet Take 81 mg by mouth daily.   Yes [provider]  labetalol (NORMODYNE) 100 MG tablet Take 1 tablet (100 mg total) by mouth daily. 01/22/16  Yes Epifanio Lesches, MD  Multiple Vitamins-Minerals-FA (DIALYVITE SUPREME D) 3 MG TABS Take 1 tablet by mouth daily. 05/27/16  Yes [provider]  tiotropium (SPIRIVA HANDIHALER) 18 MCG inhalation capsule Place 1 capsule (18 mcg total) into inhaler and inhale daily. 10/30/16  10/30/17 Yes Bettey Costa, MD  albuterol (PROVENTIL HFA;VENTOLIN HFA) 108 (90 Base) MCG/ACT inhaler Inhale 4-6 puffs by mouth every 4 hours as needed for wheezing, cough, and/or shortness of breath Patient not taking: Reported on 11/15/2016 07/11/16   Hinda Kehr, MD  atorvastatin (LIPITOR) 40 MG tablet Take 40 mg by mouth daily. 04/19/16   [provider]  budesonide-formoterol (SYMBICORT) 160-4.5 MCG/ACT inhaler Inhale 2 puffs into the lungs daily.    [provider]  furosemide (LASIX) 80 MG tablet Take 80 mg by mouth daily. 05/11/16   [provider]  gabapentin (NEURONTIN) 300 MG  capsule TAKE 1 CAPSULE BY MOUTH EVERY EVENING AND 1 EXTRA CAP ON DIALYSIS DAYS AS NEEDED FOR NERVE PAIN 08/16/16   [provider]  ipratropium-albuterol (DUONEB) 0.5-2.5 (3) MG/3ML SOLN Take 3 mLs by nebulization every 6 (six) hours as needed. 10/30/16   Bettey Costa, MD  nitroGLYCERIN (NITROSTAT) 0.4 MG SL tablet Place 1 tablet (0.4 mg total) under the tongue every 5 (five) minutes as needed. 04/12/16   Wende Bushy, MD  pantoprazole (PROTONIX) 40 MG tablet Take 1 tablet (40 mg total) by mouth daily. Patient not taking: Reported on 11/15/2016 05/10/16   Jonetta Osgood, MD  sevelamer carbonate (RENVELA) 800 MG tablet Take 3 tablets (2,400 mg total) by mouth 3 (three) times daily with meals. Patient not taking: Reported on 11/15/2016 06/01/16   Theodoro Grist, MD    Allergies Patient has no known allergies.  Family History  Problem Relation Age of Onset  . Colon cancer Mother   . Cancer Father   . Cancer Sister   . Kidney disease Brother     Social History Social History  Substance Use Topics  . Smoking status: Current Every Day Smoker    Packs/day: 0.15    Years: 40.00    Types: Cigarettes  . Smokeless tobacco: Never Used  . Alcohol use No     Comment: pt reports quitting after learning about cirrhosis    Review of Systems  Constitutional: No fever/chills Eyes: No visual changes. ENT: No sore throat. Cardiovascular: Denies chest pain. Respiratory: Positive for shortness of breath and cough Gastrointestinal: No current nausea, vomiting or diarrhea.  Genitourinary: Negative for flank pain Musculoskeletal: Negative for back pain. Skin: Negative for rash. Neurological: Negative for headaches, focal weakness or numbness.   ____________________________________________   PHYSICAL EXAM:  VITAL SIGNS: ED Triage Vitals  Enc Vitals Group     BP 11/15/16 1200 (!) 139/99     Pulse Rate 11/15/16 1200 85     Resp 11/15/16 1200 14     Temp 11/15/16 1200 98.2 F (36.8 C)       Temp Source 11/15/16 1200 Oral     SpO2 11/15/16 1151 96 %     Weight 11/15/16 1153 196 lb 4.8 oz (89 kg)     Height 11/15/16 1153 6\' 3"  (1.905 m)     Head Circumference --      Peak Flow --      Pain Score 11/15/16 1153 8     Pain Loc --      Pain Edu? --      Excl. in [place]? --     Constitutional: Alert and oriented. Slightly uncomfortable appearing, in no acute distress. Eyes: Conjunctivae are normal. No scleral icterus.  Head: Atraumatic. Nose: No congestion/rhinnorhea. Mouth/Throat: Mucous membranes are moist.   Neck: Normal range of motion.  Cardiovascular: Normal rate, regular rhythm. Grossly normal heart sounds.  Good  peripheral circulation. Respiratory: Slightly increased respiratory effort.  No retractions. Lungs with scattered wheezes and ronchi, L>R Gastrointestinal: Distended, but soft with no focal tenderness.  Genitourinary: No CVA tenderness. Musculoskeletal: 2+ lower extremity edema.  Extremities warm and well perfused.  Neurologic:  Normal speech and language. No gross focal neurologic deficits are appreciated.  Skin:  Skin is warm and dry. No rash noted. Psychiatric: Mood and affect are normal. Speech and behavior are normal.  ____________________________________________   LABS (all labs ordered are listed, but only abnormal results are displayed)  Labs Reviewed  BASIC METABOLIC PANEL - Abnormal; Notable for the following:       Result Value   Potassium 6.9 (*)    Chloride 95 (*)    BUN 60 (*)    Creatinine, Ser 11.33 (*)    Calcium 8.6 (*)    GFR calc non Af Amer 4 (*)    GFR calc Af Amer 5 (*)    Anion gap 16 (*)    All other components within normal limits  CBC - Abnormal; Notable for the following:    RBC 4.20 (*)    Hemoglobin 10.0 (*)    HCT 31.8 (*)    MCV 75.8 (*)    MCH 23.8 (*)    MCHC 31.4 (*)    RDW 19.1 (*)    All other components within normal limits  TROPONIN I - Abnormal; Notable for the following:    Troponin I 0.07 (*)     All other components within normal limits  HEPATIC FUNCTION PANEL - Abnormal; Notable for the following:    Albumin 3.3 (*)    Alkaline Phosphatase 130 (*)    Bilirubin, Direct <0.1 (*)    All other components within normal limits  AMMONIA - Abnormal; Notable for the following:    Ammonia 47 (*)    All other components within normal limits  MAGNESIUM - Abnormal; Notable for the following:    Magnesium 2.6 (*)    All other components within normal limits  PHOSPHORUS - Abnormal; Notable for the following:    Phosphorus 8.4 (*)    All other components within normal limits  BASIC METABOLIC PANEL   ____________________________________________  EKG  ED ECG REPORT I, Arta Silence, the attending physician, personally viewed and interpreted this ECG.  Date: 11/15/2016 EKG Time: 11:58 Rate: 87 Rhythm: normal sinus rhythm QRS Axis: normal Intervals: LVH, borderline prolonged QT ST/T Wave abnormalities: nonspecific T wave abnormalities inferior Narrative Interpretation: no evidence of acute ischemia, no significant change from 10/29/16   ____________________________________________  RADIOLOGY    ____________________________________________   PROCEDURES  Procedure(s) performed: No    Critical Care performed: No ____________________________________________   INITIAL IMPRESSION / ASSESSMENT AND PLAN / ED COURSE  Pertinent labs & imaging results that were available during my care of the patient were reviewed by me and considered in my medical decision making (see chart for details).  15-year-old male history of end-stage renal disease on dialysis, COPD, liver cirrhosis presents with worsening shortness of breath over the last few weeks and acutely today. Also associated with worsening abdominal distention.  Patient missed dialysis 3 days ago and has not had it in 5 days.  he was recently admitted to hospital for shortness of breath and dialysis 2 weeks ago.  On exam,  vital signs are normal, patient is slightly uncomfortable but relatively well-appearing, and exam otherwise as described. Patient has evidence of ascites but there is no focal tenderness, no fever or  other findings to suggest SBP. Overall, shortness of breath most likely related to fluid overload from ESRD/missed dialysis, COPD, or pressure from his abdominal distention. Plan: basic labs to evaluate potassium and electrolytes, chest x-ray, EKG and may need admission for dialysis and/or paracentesis.   Clinical Course as of Nov 15 2116  Tue Nov 15, 2016  1313 K is 6.9, no EKG abnormalities.  Pt will require dialysis.  Trop indeterminate, likely due to dec renal clearance.   [SS]    Clinical Course User Index [SS] Arta Silence, MD    ----------------------------------------- 2:11 PM on 11/15/2016 -----------------------------------------  Nephrology contacted and will do dialysis. Patient signed out to hospitalist.  ____________________________________________   FINAL CLINICAL IMPRESSION(S) / ED DIAGNOSES  Final diagnoses:  Hyperkalemia  End stage renal disease ([place])  SOB (shortness of breath)      NEW MEDICATIONS STARTED DURING THIS VISIT:  Current Discharge Medication List       Note:  This document was prepared using Dragon voice recognition software and may include unintentional dictation errors.     Arta Silence, MD 11/15/16 2118

## 2016-11-15 NOTE — ED Notes (Signed)
Pt taken to dialysis. Pt in NAD at time of transfer. MD and RN at dialysis waiting for pt.

## 2016-11-15 NOTE — Care Management (Signed)
APS referral for self-neglect concerns reported via telephone Stanley [name] [phone].

## 2016-11-15 NOTE — Progress Notes (Signed)
Hd completed without issue. UF 3L as ordered. Patient has no complaints. Report called to ED RN

## 2016-11-15 NOTE — ED Notes (Signed)
Dialysis called this RN and reported there was a ready bed for pt. Admitting MD at bedside. PT will be taken to dialysis after MD assessment.

## 2016-11-16 ENCOUNTER — Inpatient Hospital Stay: Payer: Medicare Other

## 2016-11-16 LAB — BASIC METABOLIC PANEL
ANION GAP: 13 (ref 5–15)
BUN: 40 mg/dL — ABNORMAL HIGH (ref 6–20)
CALCIUM: 8.4 mg/dL — AB (ref 8.9–10.3)
CO2: 31 mmol/L (ref 22–32)
Chloride: 97 mmol/L — ABNORMAL LOW (ref 101–111)
Creatinine, Ser: 8.53 mg/dL — ABNORMAL HIGH (ref 0.61–1.24)
GFR, EST AFRICAN AMERICAN: 7 mL/min — AB (ref >60)
GFR, EST NON AFRICAN AMERICAN: 6 mL/min — AB (ref >60)
Glucose, Bld: 90 mg/dL (ref 65–99)
Potassium: 5.9 mmol/L — ABNORMAL HIGH (ref 3.5–5.1)
SODIUM: 141 mmol/L (ref 135–145)

## 2016-11-16 MED ORDER — CALCIUM CARBONATE ANTACID 500 MG PO CHEW
1.0000 | CHEWABLE_TABLET | Freq: Three times a day (TID) | ORAL | Status: DC
  Administered 2016-11-16 (×2): 200 mg via ORAL
  Filled 2016-11-16 (×2): qty 1

## 2016-11-16 MED ORDER — SEVELAMER CARBONATE 800 MG PO TABS: 2400.0000 mg | ORAL_TABLET | Freq: Three times a day (TID) | ORAL | 1 refills | Status: DC

## 2016-11-16 MED ORDER — ALBUTEROL SULFATE HFA 108 (90 BASE) MCG/ACT IN AERS: INHALATION_SPRAY | RESPIRATORY_TRACT | 1 refills | Status: DC

## 2016-11-16 MED ORDER — BUDESONIDE-FORMOTEROL FUMARATE 160-4.5 MCG/ACT IN AERO: 2.0000 | INHALATION_SPRAY | Freq: Every day | RESPIRATORY_TRACT | 1 refills | Status: DC

## 2016-11-16 MED ORDER — ASPIRIN EC 81 MG PO TBEC: 81.0000 mg | DELAYED_RELEASE_TABLET | Freq: Every day | ORAL | 0 refills | Status: DC

## 2016-11-16 MED ORDER — ALUM & MAG HYDROXIDE-SIMETH 200-200-20 MG/5ML PO SUSP: 30.0000 mL | ORAL | Status: DC | PRN

## 2016-11-16 MED ORDER — TIOTROPIUM BROMIDE MONOHYDRATE 18 MCG IN CAPS: 18.0000 ug | ORAL_CAPSULE | Freq: Every day | RESPIRATORY_TRACT | 2 refills | Status: DC

## 2016-11-16 MED ORDER — LABETALOL HCL 100 MG PO TABS: 100.0000 mg | ORAL_TABLET | Freq: Every day | ORAL | 0 refills | Status: DC

## 2016-11-16 MED ORDER — FUROSEMIDE 80 MG PO TABS: 80.0000 mg | ORAL_TABLET | Freq: Every day | ORAL | 0 refills | Status: DC

## 2016-11-16 MED ORDER — ATORVASTATIN CALCIUM 40 MG PO TABS: 40.0000 mg | ORAL_TABLET | Freq: Every day | ORAL | 1 refills | Status: DC

## 2016-11-16 NOTE — Discharge Summary (Signed)
[place] [place] NAME: Stanley [name]    MR#:  [id number]  DATE OF BIRTH:  1960-04-28  DATE OF ADMISSION:  11/15/2016 ADMITTING PHYSICIAN: Fritzi Mandes, MD  DATE OF DISCHARGE: 11/16/2016   PRIMARY CARE PHYSICIAN: [name], MD    ADMISSION DIAGNOSIS:  End stage renal disease ([place]) [N18.6] Hyperkalemia [E87.5] SOB (shortness of breath) [R06.02]  DISCHARGE DIAGNOSIS:  Active Problems:   Hyperkalemia   Ascites  SECONDARY DIAGNOSIS:   Past Medical History:  Diagnosis Date  . Alcohol abuse   . Cirrhosis ([place])   . Coronary artery disease 2009  . Diabetic peripheral neuropathy ([place])   . Drug abuse   . End stage renal disease on dialysis [facility]) NEPHROLOGIST-   DR [facility]  IN [place]   HEMODIALYSIS --   TUES/  THURS/  SAT  . GERD (gastroesophageal reflux disease)   . Hyperlipidemia   . Hypertension   . PAD (peripheral artery disease) ([place])   . Renal insufficiency    Per pt, 32 oz fluid restriction per day  . S/P triple vessel bypass 06/09/2016   2009ish  . Suicidal ideation    & HOMICIDAL IDEATION --  06-16-2013   ADMITTED TO [place] COURSE:   Stanley [name]  is a 56 y.o. male with a known history of end-stage renal disease on hemodialysis, diabetes, coronary artery disease,alcoholic cirrhosis of liver with recurrent ascites last paracentesis 10/20/2016 was 6.7 L of fluid was removed. Patient comes to the emergency room with increasing shortness of breath and abdominal distention and tightness. Patient states he was very uncomfortable hence was not able to go to dialysis.  1. Acute severe hyperkalemia secondary to missing hemodialysis on Saturday -Patient to undergo emergent hemodialysis. Dr. Juleen China is aware. ER M.D. Has notified him. -EKG no evidence of peaked T waves.  Had urgent hemodialysis and potassium level came down after that. Now 5.9, he has his routine dialysis schedule  tomorrow and he will go for that as outpatient.  2. Recurrent ascites with history of alcoholic cirrhosis of liver -Patient will need ultrasound-guided paracentesis therapeutic -Last paracentesis as well as 10/20/2016--- 6.7 L of fluid was removed  paracentesis done in 7.5 L fluid removed, patient feels much better.  3.Secondary hyperparathyroidism -Continue renvela  4. COPD with ongoing tobacco abuse--- no exacerbation -Continue inhalers and nebulizers as needed  5.Anemia of chronic disease -hemoglobin stable. Patient has had extensive GI workup done in the past  6. Elevated ammonia due to cirrhosis of liver. -Patient is alert and oriented 3 -we'll give lactulose daily.  7. History of noncompliance to medications and follow-up -Patient has not been taking any of his medications.  He reports his pharmacy has not filled it up yet, but will try to get it filled up soon.  DISCHARGE CONDITIONS:   Stable.  CONSULTS OBTAINED:    DRUG ALLERGIES:  No Known Allergies  DISCHARGE MEDICATIONS:   Current Discharge Medication List    CONTINUE these medications which have CHANGED   Details  furosemide (LASIX) 80 MG tablet Take 1 tablet (80 mg total) by mouth daily. Qty: 30 tablet, Refills: 0      CONTINUE these medications which have NOT CHANGED   Details  aspirin EC 81 MG tablet Take 81 mg by mouth daily.    labetalol (NORMODYNE) 100 MG tablet Take 1 tablet (100 mg total) by mouth daily. Qty: 30 tablet, Refills: 0    Multiple  Vitamins-Minerals-FA (DIALYVITE SUPREME D) 3 MG TABS Take 1 tablet by mouth daily. Refills: 11    tiotropium (SPIRIVA HANDIHALER) 18 MCG inhalation capsule Place 1 capsule (18 mcg total) into inhaler and inhale daily. Qty: 30 capsule, Refills: 2    albuterol (PROVENTIL HFA;VENTOLIN HFA) 108 (90 Base) MCG/ACT inhaler Inhale 4-6 puffs by mouth every 4 hours as needed for wheezing, cough, and/or shortness of breath Qty: 1 Inhaler, Refills: 1     atorvastatin (LIPITOR) 40 MG tablet Take 40 mg by mouth daily. Refills: 11    budesonide-formoterol (SYMBICORT) 160-4.5 MCG/ACT inhaler Inhale 2 puffs into the lungs daily.    gabapentin (NEURONTIN) 300 MG capsule TAKE 1 CAPSULE BY MOUTH EVERY EVENING AND 1 EXTRA CAP ON DIALYSIS DAYS AS NEEDED FOR NERVE PAIN Refills: 11    ipratropium-albuterol (DUONEB) 0.5-2.5 (3) MG/3ML SOLN Take 3 mLs by nebulization every 6 (six) hours as needed. Qty: 360 mL, Refills: 0    nitroGLYCERIN (NITROSTAT) 0.4 MG SL tablet Place 1 tablet (0.4 mg total) under the tongue every 5 (five) minutes as needed. Qty: 25 tablet, Refills: 6    pantoprazole (PROTONIX) 40 MG tablet Take 1 tablet (40 mg total) by mouth daily. Qty: 30 tablet, Refills: 0    sevelamer carbonate (RENVELA) 800 MG tablet Take 3 tablets (2,400 mg total) by mouth 3 (three) times daily with meals. Qty: 270 tablet, Refills: 1         DISCHARGE INSTRUCTIONS:    Follow with PMD in 1-2 weeks. If you experience worsening of your admission symptoms, develop shortness of breath, life threatening emergency, suicidal or homicidal thoughts you must seek medical attention immediately by calling 911 or calling your MD immediately  if symptoms less severe.  You Must read complete instructions/literature along with all the possible adverse reactions/side effects for all the Medicines you take and that have been prescribed to you. Take any new Medicines after you have completely understood and accept all the possible adverse reactions/side effects.   Please note  You were cared for by a hospitalist during your hospital stay. If you have any questions about your discharge medications or the care you received while you were in the hospital after you are discharged, you can call the unit and asked to speak with the hospitalist on call if the hospitalist that took care of you is not available. Once you are discharged, your primary care physician will handle  any further medical issues. Please note that NO REFILLS for any discharge medications will be authorized once you are discharged, as it is imperative that you return to your primary care physician (or establish a relationship with a primary care physician if you do not have one) for your aftercare needs so that they can reassess your need for medications and monitor your lab values.    Today   CHIEF COMPLAINT:   Chief Complaint  Patient presents with  . Shortness of Breath    HISTORY OF PRESENT ILLNESS:  Stanley [name]  is a 56 y.o. male with a known history of end-stage renal disease on hemodialysis, diabetes, coronary artery disease,alcoholic cirrhosis of liver with recurrent ascites last paracentesis 10/20/2016 was 6.7 L of fluid was removed. Patient comes to the emergency room with increasing shortness of breath and abdominal distention and tightness. Patient states he was very uncomfortable hence was not able to go to dialysis. Patient has tense ascites, elevated potassium without any EKG changes. Patient is on his way to get emergent hemodialysis secondary to  severe hypokalemia due to missing dialysis on Saturday. He is being admitted for further nausea management of recurrent ascites.   VITAL SIGNS:  Blood pressure 121/75, pulse 75, temperature (!) 97.5 F (36.4 C), temperature source Oral, resp. rate 18, height 6\' 3"  (1.905 m), weight 89 kg (196 lb 4.8 oz), SpO2 98 %.  I/O:   Intake/Output Summary (Last 24 hours) at 11/16/16 1423 Last data filed at 11/15/16 1750  Gross per 24 hour  Intake                0 ml  Output             3001 ml  Net            -3001 ml    PHYSICAL EXAMINATION:  GENERAL:  56 y.o.-year-old patient lying in the bed with no acute distress.  EYES: Pupils equal, round, reactive to light and accommodation. No scleral icterus. Extraocular muscles intact.  HEENT: Head atraumatic, normocephalic. Oropharynx and nasopharynx clear.  NECK:  Supple, no jugular  venous distention. No thyroid enlargement, no tenderness.  LUNGS: Normal breath sounds bilaterally, no wheezing, rales,rhonchi or crepitation. No use of accessory muscles of respiration.  CARDIOVASCULAR: S1, S2 normal. No murmurs, rubs, or gallops.  ABDOMEN: Soft, non-tender, non-distended. Bowel sounds present. No organomegaly or mass.  EXTREMITIES: No pedal edema, cyanosis, or clubbing.  NEUROLOGIC: Cranial nerves II through XII are intact. Muscle strength 5/5 in all extremities. Sensation intact. Gait not checked.  PSYCHIATRIC: The patient is alert and oriented x 3.  SKIN: No obvious rash, lesion, or ulcer.   DATA REVIEW:   CBC  Recent Labs Lab 11/15/16 1200  WBC 7.9  HGB 10.0*  HCT 31.8*  PLT 294    Chemistries   Recent Labs Lab 11/15/16 1200 11/16/16 0423  NA 138 141  K 6.9* 5.9*  CL 95* 97*  CO2 27 31  GLUCOSE 98 90  BUN 60* 40*  CREATININE 11.33* 8.53*  CALCIUM 8.6* 8.4*  MG 2.6*  --   AST 28  --   ALT 19  --   ALKPHOS 130*  --   BILITOT 0.8  --     Cardiac Enzymes  Recent Labs Lab 11/15/16 1200  TROPONINI 0.07*    Microbiology Results  Results for orders placed or performed during the hospital encounter of 10/20/16  Body fluid culture     Status: None   Collection Time: 10/20/16  3:00 PM  Result Value Ref Range Status   Specimen Description PERITONEAL  Final   Special Requests NONE  Final   Gram Stain   Final    FEW WBC PRESENT, PREDOMINANTLY MONONUCLEAR NO ORGANISMS SEEN    Culture   Final    NO GROWTH 3 DAYS Performed at [facility], [place] [address] [place], [place] [address]    Report Status 10/24/2016 FINAL  Final  MRSA PCR Screening     Status: None   Collection Time: 10/20/16  5:31 PM  Result Value Ref Range Status   MRSA by PCR NEGATIVE NEGATIVE Final    Comment:        The GeneXpert MRSA Assay (FDA approved for NASAL specimens only), is one component of a comprehensive MRSA colonization surveillance program. It is  not intended to diagnose MRSA infection nor to guide or monitor treatment for MRSA infections.     RADIOLOGY:  Dg Chest 2 View  Result Date: 11/15/2016 CLINICAL DATA:  Shortness of breath.  On  dialysis.  Cirrhosis. EXAM: CHEST  2 VIEW COMPARISON:  10/29/2016 FINDINGS: Mild hyperinflation. Median sternotomy for CABG. Midline trachea. Moderate cardiomegaly. Atherosclerosis in the transverse aorta. No pleural effusion or pneumothorax. Pulmonary interstitial prominence persists. IMPRESSION: Cardiomegaly with similar mild pulmonary venous congestion. Aortic Atherosclerosis (ICD10-I70.0). Electronically Signed   By: Abigail Miyamoto M.D.   On: 11/15/2016 12:32   US Paracentesis  Result Date: 11/16/2016 CLINICAL DATA:  Renal insufficiency, cirrhosis, recurrent large volume abdominal ascites. EXAM: ULTRASOUND GUIDED PARACENTESIS TECHNIQUE: The procedure, risks (including but not limited to bleeding, infection, organ damage ), benefits, and alternatives were explained to the patient. Questions regarding the procedure were encouraged and answered. The patient understands and consents to the procedure. Survey ultrasound of the abdomen was performed and an appropriate skin entry site in the right lateral abdomen was selected. Skin site was marked, prepped with chlorhexadine, draped in usual sterile fashion, and infiltrated locally with 1% lidocaine. A Safe-T-Centesis needle was advanced into the peritoneal space until fluid could be aspirated. The sheath was advanced and the needle removed. 7.5 L of amber colored ascites were aspirated. The patient tolerated the procedure well. COMPLICATIONS: COMPLICATIONS none IMPRESSION: Technically successful ultrasound guided paracentesis, removing 7.5 L ascites. Electronically Signed   By: Lucrezia Europe M.D.   On: 11/16/2016 11:02    EKG:   Orders placed or performed during the hospital encounter of 11/15/16  . ED EKG within 10 minutes  . ED EKG within 10 minutes       Management plans discussed with the patient, family and they are in agreement.  CODE STATUS:     Code Status Orders        Start     Ordered   11/15/16 1931  Full code  Continuous     11/15/16 1930    Code Status History    Date Active Date Inactive Code Status Order ID Comments User Context   10/29/2016  2:53 PM 10/30/2016  3:06 PM Full Code [id number]  Loletha Grayer, MD ED   09/15/2016  5:57 PM 09/16/2016  6:17 PM Full Code [id number]  Dustin Flock, MD ED   08/16/2016  9:45 AM 08/18/2016  6:16 PM Full Code [id number]  Hillary Bow, MD ED   08/09/2016  4:59 PM 08/12/2016  8:34 PM Full Code [id number]  Fritzi Mandes, MD Inpatient   07/03/2016  9:54 PM 07/05/2016  9:22 PM Full Code [id number]  Demetrios Loll, MD Inpatient   06/24/2016  4:23 AM 06/26/2016  3:46 PM Full Code [id number]  Saundra Shelling, MD Inpatient   06/14/2016  2:53 PM 06/19/2016  9:04 PM Full Code [id number]  Hillary Bow, MD ED   05/31/2016  6:42 AM 06/01/2016  9:30 PM Full Code [id number]  Saundra Shelling, MD Inpatient   05/05/2016 11:09 PM 05/10/2016  9:57 PM Full Code [id number]  Vianne Bulls, MD Inpatient   04/16/2016  3:56 AM 04/17/2016  6:27 PM Full Code [id number]  Saundra Shelling, MD Inpatient   01/19/2016  3:13 PM 01/22/2016  2:14 PM Full Code [id number]  Loletha Grayer, MD ED   09/24/2015 10:33 AM 09/25/2015  1:31 PM Full Code [id number]  Loletha Grayer, MD ED   05/16/2015  2:17 PM 05/17/2015  8:57 PM Full Code [id number]  Idelle Crouch, MD Inpatient   04/30/2015  8:08 PM 05/08/2015  7:40 PM Full Code [id number]  Vaughan Basta, MD Inpatient   04/06/2015  5:07 AM 04/07/2015  5:36 PM Full Code [id number]  Lance Coon, MD Inpatient  03/23/2015 11:07 AM 03/27/2015 12:46 PM Full Code [id number]  Bettey Costa, MD Inpatient   06/19/2013 11:32 AM 06/21/2013  5:49 PM Full Code [id number]  Cristal Ford, DO Inpatient   06/16/2013  5:26 PM 06/19/2013 11:32 AM Full Code [id number]  Margarita Mail, PA-C ED   06/05/2013  3:33 PM 06/06/2013  6:56 PM  Full Code [id number]  Theodoro Kos, DO Inpatient   06/02/2013  9:01 AM 06/05/2013  3:33 PM Full Code [id number]  Velvet Bathe, MD Inpatient   06/02/2013  8:40 AM 06/02/2013  9:01 AM Full Code [id number]  Velvet Bathe, MD Inpatient      TOTAL TIME TAKING CARE OF THIS PATIENT: 35 minutes.    Vaughan Basta M.D on 11/16/2016 at 2:23 PM  Between 7am to 6pm - Pager - [phone]  After 6pm go to [email or web address] - password [email or web address] [facility]  Office  [phone]  CC: Primary care physician; [name], MD   Note: This dictation was prepared with Dragon dictation along with smaller phrase technology. Any transcriptional errors that result from this process are unintentional.

## 2016-11-16 NOTE — Progress Notes (Signed)
[facility]  ROUNDING NOTE   Subjective:   Hemodialysis yesterday. Tolerated treatment well. UF of 3 liters  Ultrasound guided paracentesis 7.5 liters removed.   K 5.9  Objective:  Vital signs in last 24 hours:  Temp:  [97.5 F (36.4 C)-98.3 F (36.8 C)] 97.5 F (36.4 C) (09/05 1203) Pulse Rate:  [73-92] 75 (09/05 1203) Resp:  [12-22] 18 (09/05 0951) BP: (115-169)/(75-100) 121/75 (09/05 1203) SpO2:  [94 %-100 %] 98 % (09/05 1203)  Weight change:  Filed Weights   11/15/16 1153  Weight: 89 kg (196 lb 4.8 oz)    Intake/Output: I/O last 3 completed shifts: In: -  Out: 3001 [Other:3001]   Intake/Output this shift:  No intake/output data recorded.  Physical Exam: General: No acute distress  Head: Normocephalic, atraumatic. Moist oral mucosal membranes  Eyes: Anicteric  Neck: Supple, trachea midline  Lungs:  Clear to auscultation, normal effort  Heart: S1S2 no rubs  Abdomen:  Soft, distended   Extremities: no peripheral edema.  Neurologic: Awake, alert, following commands  Skin: No lesions  Access: LUE AVF    Basic Metabolic Panel:  Recent Labs Lab 11/15/16 1200 11/16/16 0423  NA 138 141  K 6.9* 5.9*  CL 95* 97*  CO2 27 31  GLUCOSE 98 90  BUN 60* 40*  CREATININE 11.33* 8.53*  CALCIUM 8.6* 8.4*  MG 2.6*  --   PHOS 8.4*  --     Liver Function Tests:  Recent Labs Lab 11/15/16 1200  AST 28  ALT 19  ALKPHOS 130*  BILITOT 0.8  PROT 7.5  ALBUMIN 3.3*   No results for input(s): LIPASE, AMYLASE in the last 168 hours.  Recent Labs Lab 11/15/16 1205  AMMONIA 47*    CBC:  Recent Labs Lab 11/15/16 1200  WBC 7.9  HGB 10.0*  HCT 31.8*  MCV 75.8*  PLT 294    Cardiac Enzymes:  Recent Labs Lab 11/15/16 1200  TROPONINI 0.07*    BNP: Invalid input(s): POCBNP  CBG: No results for input(s): GLUCAP in the last 168 hours.  Microbiology: Results for orders placed or performed during the hospital encounter of 10/20/16   Body fluid culture     Status: None   Collection Time: 10/20/16  3:00 PM  Result Value Ref Range Status   Specimen Description PERITONEAL  Final   Special Requests NONE  Final   Gram Stain   Final    FEW WBC PRESENT, PREDOMINANTLY MONONUCLEAR NO ORGANISMS SEEN    Culture   Final    NO GROWTH 3 DAYS Performed at [facility], [place] [address] [place], [place] [address]    Report Status 10/24/2016 FINAL  Final  MRSA PCR Screening     Status: None   Collection Time: 10/20/16  5:31 PM  Result Value Ref Range Status   MRSA by PCR NEGATIVE NEGATIVE Final    Comment:        The GeneXpert MRSA Assay (FDA approved for NASAL specimens only), is one component of a comprehensive MRSA colonization surveillance program. It is not intended to diagnose MRSA infection nor to guide or monitor treatment for MRSA infections.     Coagulation Studies: No results for input(s): LABPROT, INR in the last 72 hours.  Urinalysis: No results for input(s): COLORURINE, LABSPEC, PHURINE, GLUCOSEU, HGBUR, BILIRUBINUR, KETONESUR, PROTEINUR, UROBILINOGEN, NITRITE, LEUKOCYTESUR in the last 72 hours.  Invalid input(s): APPERANCEUR    Imaging: Dg Chest 2 View  Result Date: 11/15/2016 CLINICAL DATA:  Shortness of breath.  On dialysis.  Cirrhosis. EXAM: CHEST  2 VIEW COMPARISON:  10/29/2016 FINDINGS: Mild hyperinflation. Median sternotomy for CABG. Midline trachea. Moderate cardiomegaly. Atherosclerosis in the transverse aorta. No pleural effusion or pneumothorax. Pulmonary interstitial prominence persists. IMPRESSION: Cardiomegaly with similar mild pulmonary venous congestion. Aortic Atherosclerosis (ICD10-I70.0). Electronically Signed   By: Abigail Miyamoto M.D.   On: 11/15/2016 12:32   US Paracentesis  Result Date: 11/16/2016 CLINICAL DATA:  Renal insufficiency, cirrhosis, recurrent large volume abdominal ascites. EXAM: ULTRASOUND GUIDED PARACENTESIS TECHNIQUE: The procedure, risks (including but not  limited to bleeding, infection, organ damage ), benefits, and alternatives were explained to the patient. Questions regarding the procedure were encouraged and answered. The patient understands and consents to the procedure. Survey ultrasound of the abdomen was performed and an appropriate skin entry site in the right lateral abdomen was selected. Skin site was marked, prepped with chlorhexadine, draped in usual sterile fashion, and infiltrated locally with 1% lidocaine. A Safe-T-Centesis needle was advanced into the peritoneal space until fluid could be aspirated. The sheath was advanced and the needle removed. 7.5 L of amber colored ascites were aspirated. The patient tolerated the procedure well. COMPLICATIONS: COMPLICATIONS none IMPRESSION: Technically successful ultrasound guided paracentesis, removing 7.5 L ascites. Electronically Signed   By: Lucrezia Europe M.D.   On: 11/16/2016 11:02     Medications:       Assessment/ Plan:  56 y.o. black male  with end stage renal disease on hemodialysis secondary to Alport's syndrome, end stage liver disease with hepatic cirrhosis and ascites, hypertension, anemia of chronic kidney disease, coronary artery disease, peripheral vascular disease, hyperlipidemia  [facility]TS [facility] AVF  1. End Stage Renal Disease with hyperkalemia. Potassium now 5.9  - Low potassium diet - Hemodialysis TTS schedule  2. Anemia of chronic kidney disease: hemoglobin 10 - mircera as an outpatient - Monitor closely with recent GI bleed.   3. Hypertension: blood pressure at goal.  - home regimen: labetalol and furosemide.   4. Secondary Hyperparathyroidism:  - Continue sevelamer 3 tabs with meals.   5. Ascites: secondary to end stage liver disease/cirrhosis and end stage renal disease - status post large volume ultrasound guided paracentesis on 9/5 yielding 7.5 liters. No albumin given.    LOS: [place], [place] 9/5/201812:37 PM

## 2016-11-16 NOTE — Progress Notes (Signed)
11/16/2016  3:19 PM  [name] to be D/C'd Home per MD order.  Discussed prescriptions and follow up appointments with the patient. Prescriptions given to patient, medication list explained in detail. Pt verbalized understanding.  Allergies as of 11/16/2016   No Known Allergies     Medication List    TAKE these medications   albuterol 108 (90 Base) MCG/ACT inhaler Commonly known as:  PROVENTIL HFA;VENTOLIN HFA Inhale 4-6 puffs by mouth every 4 hours as needed for wheezing, cough, and/or shortness of breath   aspirin EC 81 MG tablet Take 1 tablet (81 mg total) by mouth daily.   atorvastatin 40 MG tablet Commonly known as:  LIPITOR Take 1 tablet (40 mg total) by mouth daily.   budesonide-formoterol 160-4.5 MCG/ACT inhaler Commonly known as:  SYMBICORT Inhale 2 puffs into the lungs daily.   DIALYVITE SUPREME D 3 MG Tabs Take 1 tablet by mouth daily.   furosemide 80 MG tablet Commonly known as:  LASIX Take 1 tablet (80 mg total) by mouth daily.   gabapentin 300 MG capsule Commonly known as:  NEURONTIN TAKE 1 CAPSULE BY MOUTH EVERY EVENING AND 1 EXTRA CAP ON DIALYSIS DAYS AS NEEDED FOR NERVE PAIN   ipratropium-albuterol 0.5-2.5 (3) MG/3ML Soln Commonly known as:  DUONEB Take 3 mLs by nebulization every 6 (six) hours as needed.   labetalol 100 MG tablet Commonly known as:  NORMODYNE Take 1 tablet (100 mg total) by mouth daily.   nitroGLYCERIN 0.4 MG SL tablet Commonly known as:  NITROSTAT Place 1 tablet (0.4 mg total) under the tongue every 5 (five) minutes as needed.   pantoprazole 40 MG tablet Commonly known as:  PROTONIX Take 1 tablet (40 mg total) by mouth daily.   sevelamer carbonate 800 MG tablet Commonly known as:  RENVELA Take 3 tablets (2,400 mg total) by mouth 3 (three) times daily with meals.   tiotropium 18 MCG inhalation capsule Commonly known as:  SPIRIVA HANDIHALER Place 1 capsule (18 mcg total) into inhaler and inhale daily.             Discharge Care Instructions        Start     Ordered   11/16/16 0000  furosemide (LASIX) 80 MG tablet  Daily     11/16/16 1423   11/16/16 0000  Increase activity slowly     11/16/16 1423   11/16/16 0000  Diet - low sodium heart healthy     11/16/16 1423   11/16/16 0000  aspirin EC 81 MG tablet  Daily     11/16/16 1433   11/16/16 0000  albuterol (PROVENTIL HFA;VENTOLIN HFA) 108 (90 Base) MCG/ACT inhaler     11/16/16 1433   11/16/16 0000  atorvastatin (LIPITOR) 40 MG tablet  Daily     11/16/16 1433   11/16/16 0000  budesonide-formoterol (SYMBICORT) 160-4.5 MCG/ACT inhaler  Daily     11/16/16 1433   11/16/16 0000  labetalol (NORMODYNE) 100 MG tablet  Daily     11/16/16 1433   11/16/16 0000  sevelamer carbonate (RENVELA) 800 MG tablet  3 times daily with meals     11/16/16 1433   11/16/16 0000  tiotropium (SPIRIVA HANDIHALER) 18 MCG inhalation capsule  Daily     11/16/16 1433      Vitals:   11/16/16 0951 11/16/16 1203  BP: 138/82 121/75  Pulse: 76 75  Resp: 18   Temp:  (!) 97.5 F (36.4 C)  SpO2: 98% 98%  Skin clean, dry and intact without evidence of skin break down, no evidence of skin tears noted. IV catheter discontinued intact. Site without signs and symptoms of complications. Dressing and pressure applied. Pt denies pain at this time. No complaints noted.  An After Visit Summary was printed and given to the patient. Patient escorted via [facility], and D/C home via private auto.  Dola Argyle

## 2016-11-16 NOTE — Care Management (Signed)
[facility] HD liaison notified of discharge

## 2016-11-16 NOTE — Care Management (Signed)
[name] Age [phone] that patient is being discharged from hospital.

## 2016-11-17 NOTE — Care Management (Addendum)
Post discharge note entry 11/17/2016- RNCM received call back from [place] asking me to make a report to [facility] intake [phone] although she said she did note the patient's name and concern a report was not filed. I have left another message at [phone]- [facility] intake to return my call to file report. 1117AM: call back from [place] with [facility]- report filed.

## 2016-11-19 DIAGNOSIS — D509 Iron deficiency anemia, unspecified: Secondary | ICD-10-CM | POA: Diagnosis not present

## 2016-11-19 DIAGNOSIS — N2581 Secondary hyperparathyroidism of renal origin: Secondary | ICD-10-CM | POA: Diagnosis not present

## 2016-11-19 DIAGNOSIS — N186 End stage renal disease: Secondary | ICD-10-CM | POA: Diagnosis not present

## 2016-11-19 DIAGNOSIS — D649 Anemia, unspecified: Secondary | ICD-10-CM | POA: Diagnosis not present

## 2016-11-22 DIAGNOSIS — N2581 Secondary hyperparathyroidism of renal origin: Secondary | ICD-10-CM | POA: Diagnosis not present

## 2016-11-22 DIAGNOSIS — D509 Iron deficiency anemia, unspecified: Secondary | ICD-10-CM | POA: Diagnosis not present

## 2016-11-22 DIAGNOSIS — N186 End stage renal disease: Secondary | ICD-10-CM | POA: Diagnosis not present

## 2016-11-22 DIAGNOSIS — D649 Anemia, unspecified: Secondary | ICD-10-CM | POA: Diagnosis not present

## 2016-11-26 ENCOUNTER — Encounter: Payer: Self-pay | Admitting: Emergency Medicine

## 2016-11-26 ENCOUNTER — Inpatient Hospital Stay
Admission: EM | Admit: 2016-11-26 | Discharge: 2016-11-28 | DRG: 640 | Disposition: A | Discharge status: Home / Self Care | Payer: Medicare Other | Attending: Internal Medicine | Admitting: Internal Medicine

## 2016-11-26 ENCOUNTER — Emergency Department: Payer: Medicare Other

## 2016-11-26 DIAGNOSIS — D631 Anemia in chronic kidney disease: Secondary | ICD-10-CM | POA: Diagnosis present

## 2016-11-26 DIAGNOSIS — E875 Hyperkalemia: Secondary | ICD-10-CM

## 2016-11-26 DIAGNOSIS — Z7951 Long term (current) use of inhaled steroids: Secondary | ICD-10-CM | POA: Diagnosis not present

## 2016-11-26 DIAGNOSIS — R188 Other ascites: Secondary | ICD-10-CM

## 2016-11-26 DIAGNOSIS — R0602 Shortness of breath: Secondary | ICD-10-CM | POA: Diagnosis not present

## 2016-11-26 DIAGNOSIS — Z9115 Patient's noncompliance with renal dialysis: Secondary | ICD-10-CM | POA: Diagnosis not present

## 2016-11-26 DIAGNOSIS — E1151 Type 2 diabetes mellitus with diabetic peripheral angiopathy without gangrene: Secondary | ICD-10-CM | POA: Diagnosis present

## 2016-11-26 DIAGNOSIS — N2581 Secondary hyperparathyroidism of renal origin: Secondary | ICD-10-CM | POA: Diagnosis present

## 2016-11-26 DIAGNOSIS — Z955 Presence of coronary angioplasty implant and graft: Secondary | ICD-10-CM

## 2016-11-26 DIAGNOSIS — Z79899 Other long term (current) drug therapy: Secondary | ICD-10-CM | POA: Diagnosis not present

## 2016-11-26 DIAGNOSIS — I132 Hypertensive heart and chronic kidney disease with heart failure and with stage 5 chronic kidney disease, or end stage renal disease: Secondary | ICD-10-CM | POA: Diagnosis present

## 2016-11-26 DIAGNOSIS — R778 Other specified abnormalities of plasma proteins: Secondary | ICD-10-CM

## 2016-11-26 DIAGNOSIS — I509 Heart failure, unspecified: Secondary | ICD-10-CM

## 2016-11-26 DIAGNOSIS — F17210 Nicotine dependence, cigarettes, uncomplicated: Secondary | ICD-10-CM | POA: Diagnosis present

## 2016-11-26 DIAGNOSIS — K7031 Alcoholic cirrhosis of liver with ascites: Secondary | ICD-10-CM | POA: Diagnosis present

## 2016-11-26 DIAGNOSIS — N186 End stage renal disease: Secondary | ICD-10-CM | POA: Diagnosis present

## 2016-11-26 DIAGNOSIS — E1142 Type 2 diabetes mellitus with diabetic polyneuropathy: Secondary | ICD-10-CM | POA: Diagnosis present

## 2016-11-26 DIAGNOSIS — Z7982 Long term (current) use of aspirin: Secondary | ICD-10-CM | POA: Diagnosis not present

## 2016-11-26 DIAGNOSIS — J9601 Acute respiratory failure with hypoxia: Secondary | ICD-10-CM | POA: Diagnosis present

## 2016-11-26 DIAGNOSIS — E785 Hyperlipidemia, unspecified: Secondary | ICD-10-CM | POA: Diagnosis present

## 2016-11-26 DIAGNOSIS — I10 Essential (primary) hypertension: Secondary | ICD-10-CM | POA: Diagnosis not present

## 2016-11-26 DIAGNOSIS — J441 Chronic obstructive pulmonary disease with (acute) exacerbation: Secondary | ICD-10-CM | POA: Diagnosis present

## 2016-11-26 DIAGNOSIS — I2510 Atherosclerotic heart disease of native coronary artery without angina pectoris: Secondary | ICD-10-CM | POA: Diagnosis present

## 2016-11-26 DIAGNOSIS — K219 Gastro-esophageal reflux disease without esophagitis: Secondary | ICD-10-CM | POA: Diagnosis present

## 2016-11-26 DIAGNOSIS — Z992 Dependence on renal dialysis: Secondary | ICD-10-CM

## 2016-11-26 DIAGNOSIS — K7290 Hepatic failure, unspecified without coma: Secondary | ICD-10-CM | POA: Diagnosis present

## 2016-11-26 DIAGNOSIS — I5033 Acute on chronic diastolic (congestive) heart failure: Secondary | ICD-10-CM | POA: Diagnosis present

## 2016-11-26 DIAGNOSIS — R7989 Other specified abnormal findings of blood chemistry: Secondary | ICD-10-CM | POA: Diagnosis not present

## 2016-11-26 DIAGNOSIS — E8770 Fluid overload, unspecified: Principal | ICD-10-CM | POA: Diagnosis present

## 2016-11-26 DIAGNOSIS — E1122 Type 2 diabetes mellitus with diabetic chronic kidney disease: Secondary | ICD-10-CM | POA: Diagnosis present

## 2016-11-26 LAB — BASIC METABOLIC PANEL
ANION GAP: 16 — AB (ref 5–15)
BUN: 52 mg/dL — AB (ref 6–20)
CO2: 31 mmol/L (ref 22–32)
Calcium: 9.1 mg/dL (ref 8.9–10.3)
Chloride: 96 mmol/L — ABNORMAL LOW (ref 101–111)
Creatinine, Ser: 10.71 mg/dL — ABNORMAL HIGH (ref 0.61–1.24)
GFR, EST AFRICAN AMERICAN: 5 mL/min — AB (ref >60)
GFR, EST NON AFRICAN AMERICAN: 5 mL/min — AB (ref >60)
Glucose, Bld: 87 mg/dL (ref 65–99)
POTASSIUM: 6.3 mmol/L — AB (ref 3.5–5.1)
SODIUM: 143 mmol/L (ref 135–145)

## 2016-11-26 LAB — BRAIN NATRIURETIC PEPTIDE: B NATRIURETIC PEPTIDE 5: 3470 pg/mL — AB (ref 0.0–100.0)

## 2016-11-26 LAB — HEPATIC FUNCTION PANEL
ALBUMIN: 3.3 g/dL — AB (ref 3.5–5.0)
ALK PHOS: 141 U/L — AB (ref 38–126)
ALT: 21 U/L (ref 17–63)
AST: 31 U/L (ref 15–41)
Bilirubin, Direct: 0.2 mg/dL (ref 0.1–0.5)
Indirect Bilirubin: 0.4 mg/dL (ref 0.3–0.9)
TOTAL PROTEIN: 7.7 g/dL (ref 6.5–8.1)
Total Bilirubin: 0.6 mg/dL (ref 0.3–1.2)

## 2016-11-26 LAB — CBC
HEMATOCRIT: 35.1 % — AB (ref 40.0–52.0)
HEMOGLOBIN: 10.8 g/dL — AB (ref 13.0–18.0)
MCH: 23.7 pg — ABNORMAL LOW (ref 26.0–34.0)
MCHC: 30.8 g/dL — ABNORMAL LOW (ref 32.0–36.0)
MCV: 76.8 fL — ABNORMAL LOW (ref 80.0–100.0)
Platelets: 286 10*3/uL (ref 150–440)
RBC: 4.57 MIL/uL (ref 4.40–5.90)
RDW: 20.2 % — AB (ref 11.5–14.5)
WBC: 6.9 10*3/uL (ref 3.8–10.6)

## 2016-11-26 LAB — POTASSIUM: Potassium: 4.3 mmol/L (ref 3.5–5.1)

## 2016-11-26 LAB — MAGNESIUM: Magnesium: 1.9 mg/dL (ref 1.7–2.4)

## 2016-11-26 LAB — TROPONIN I: Troponin I: 0.12 ng/mL (ref <0.03)

## 2016-11-26 LAB — MRSA PCR SCREENING: MRSA by PCR: NEGATIVE

## 2016-11-26 MED ORDER — MOMETASONE FURO-FORMOTEROL FUM 200-5 MCG/ACT IN AERO
2.0000 | INHALATION_SPRAY | Freq: Two times a day (BID) | RESPIRATORY_TRACT | Status: DC
  Administered 2016-11-26 – 2016-11-27 (×3): 2 via RESPIRATORY_TRACT
  Filled 2016-11-26: qty 8.8

## 2016-11-26 MED ORDER — ACETAMINOPHEN 325 MG PO TABS: 650.0000 mg | ORAL_TABLET | Freq: Four times a day (QID) | ORAL | Status: DC | PRN

## 2016-11-26 MED ORDER — GABAPENTIN 300 MG PO CAPS
300.0000 mg | ORAL_CAPSULE | Freq: Every day | ORAL | Status: DC
  Administered 2016-11-26 – 2016-11-27 (×2): 300 mg via ORAL
  Filled 2016-11-26 (×2): qty 1

## 2016-11-26 MED ORDER — TIOTROPIUM BROMIDE MONOHYDRATE 18 MCG IN CAPS
18.0000 ug | ORAL_CAPSULE | Freq: Every day | RESPIRATORY_TRACT | Status: DC
  Administered 2016-11-27: 18 ug via RESPIRATORY_TRACT
  Filled 2016-11-26: qty 5

## 2016-11-26 MED ORDER — FUROSEMIDE 40 MG PO TABS
80.0000 mg | ORAL_TABLET | Freq: Every day | ORAL | Status: DC
  Administered 2016-11-27: 80 mg via ORAL
  Filled 2016-11-26 (×2): qty 2

## 2016-11-26 MED ORDER — ONDANSETRON HCL 4 MG PO TABS: 4.0000 mg | ORAL_TABLET | Freq: Four times a day (QID) | ORAL | Status: DC | PRN

## 2016-11-26 MED ORDER — GI COCKTAIL ~~LOC~~
30.0000 mL | Freq: Three times a day (TID) | ORAL | Status: DC | PRN
  Administered 2016-11-26 – 2016-11-27 (×3): 30 mL via ORAL
  Filled 2016-11-26 (×4): qty 30

## 2016-11-26 MED ORDER — FUROSEMIDE 10 MG/ML IJ SOLN
60.0000 mg | Freq: Once | INTRAMUSCULAR | Status: AC
  Administered 2016-11-26: 60 mg via INTRAVENOUS
  Filled 2016-11-26: qty 8

## 2016-11-26 MED ORDER — HEPARIN SODIUM (PORCINE) 5000 UNIT/ML IJ SOLN
5000.0000 [IU] | Freq: Three times a day (TID) | INTRAMUSCULAR | Status: DC
  Administered 2016-11-26 – 2016-11-28 (×6): 5000 [IU] via SUBCUTANEOUS
  Filled 2016-11-26 (×6): qty 1

## 2016-11-26 MED ORDER — HYDRALAZINE HCL 20 MG/ML IJ SOLN
10.0000 mg | Freq: Four times a day (QID) | INTRAMUSCULAR | Status: DC | PRN
  Filled 2016-11-26: qty 1

## 2016-11-26 MED ORDER — LABETALOL HCL 100 MG PO TABS
100.0000 mg | ORAL_TABLET | Freq: Every day | ORAL | Status: DC
  Administered 2016-11-26 – 2016-11-27 (×2): 100 mg via ORAL
  Filled 2016-11-26 (×3): qty 1

## 2016-11-26 MED ORDER — ALUM & MAG HYDROXIDE-SIMETH 200-200-20 MG/5ML PO SUSP
30.0000 mL | ORAL | Status: DC | PRN
Start: 2016-11-26 — End: 2016-11-28
  Administered 2016-11-26 – 2016-11-27 (×2): 30 mL via ORAL
  Filled 2016-11-26 (×3): qty 30

## 2016-11-26 MED ORDER — DIALYVITE SUPREME D 3 MG PO TABS: 1.0000 | ORAL_TABLET | Freq: Every day | ORAL | Status: DC

## 2016-11-26 MED ORDER — RENA-VITE PO TABS
1.0000 | ORAL_TABLET | Freq: Every day | ORAL | Status: DC
  Administered 2016-11-26 – 2016-11-27 (×2): 1 via ORAL
  Filled 2016-11-26 (×2): qty 1

## 2016-11-26 MED ORDER — SEVELAMER CARBONATE 800 MG PO TABS
2400.0000 mg | ORAL_TABLET | Freq: Three times a day (TID) | ORAL | Status: DC
  Administered 2016-11-26 – 2016-11-28 (×4): 2400 mg via ORAL
  Filled 2016-11-26 (×5): qty 3

## 2016-11-26 MED ORDER — ASPIRIN EC 81 MG PO TBEC
81.0000 mg | DELAYED_RELEASE_TABLET | Freq: Every day | ORAL | Status: DC
  Administered 2016-11-26 – 2016-11-27 (×2): 81 mg via ORAL
  Filled 2016-11-26 (×3): qty 1

## 2016-11-26 MED ORDER — NITROGLYCERIN 0.4 MG SL SUBL: 0.4000 mg | SUBLINGUAL_TABLET | SUBLINGUAL | Status: DC | PRN

## 2016-11-26 MED ORDER — ONDANSETRON HCL 4 MG/2ML IJ SOLN
4.0000 mg | Freq: Four times a day (QID) | INTRAMUSCULAR | Status: DC | PRN
  Administered 2016-11-27: 4 mg via INTRAVENOUS
  Filled 2016-11-26: qty 2

## 2016-11-26 MED ORDER — ATORVASTATIN CALCIUM 20 MG PO TABS
40.0000 mg | ORAL_TABLET | Freq: Every day | ORAL | Status: DC
  Administered 2016-11-26 – 2016-11-27 (×2): 40 mg via ORAL
  Filled 2016-11-26 (×2): qty 2

## 2016-11-26 MED ORDER — ACETAMINOPHEN 650 MG RE SUPP: 650.0000 mg | Freq: Four times a day (QID) | RECTAL | Status: DC | PRN

## 2016-11-26 MED ORDER — IPRATROPIUM-ALBUTEROL 0.5-2.5 (3) MG/3ML IN SOLN: 3.0000 mL | Freq: Four times a day (QID) | RESPIRATORY_TRACT | Status: DC | PRN

## 2016-11-26 NOTE — ED Notes (Signed)
Report to kristin, rn.

## 2016-11-26 NOTE — Progress Notes (Signed)
Patient returned to the floor from dialysis, patient alert and oriented, vss, denies any pain, dialysis completed, 1.5 k removed as per diaslysis nurse

## 2016-11-26 NOTE — ED Triage Notes (Signed)
Pt from home with shob. Pt received dialysis and has not had dialysis in a week. Pt states also has to have ascites drained weekly and has not had that done in 2 weeks. Pt is able to speak in full sentences. Round swollen abd noted.

## 2016-11-26 NOTE — Progress Notes (Signed)
Pt Complaining of Heartburn and requesting specifically GI cocktail. Primary nurse notified Dr. Almyra Free and orders received. Primary nurse to continue to monitor.

## 2016-11-26 NOTE — Progress Notes (Signed)
Dialysis started 

## 2016-11-26 NOTE — ED Notes (Signed)
Warm blankets provided. Call bell at right side.

## 2016-11-26 NOTE — Progress Notes (Signed)
Received call from central, patient had 34 beep V.tach, pt asymptomatic Md notified, order received for stat  Lab .

## 2016-11-26 NOTE — Progress Notes (Signed)
Patient off the floor to dialysis at this time

## 2016-11-26 NOTE — Progress Notes (Signed)
HD COMPLETED  

## 2016-11-26 NOTE — ED Notes (Signed)
Patient transported to 226.

## 2016-11-26 NOTE — H&P (Signed)
[place] [facility] [place] NAME: Stanley [name]    MR#:  [id number]  DATE OF BIRTH:  [birth year]  DATE OF ADMISSION:  11/26/2016  PRIMARY CARE PHYSICIAN: [name], MD   REQUESTING/REFERRING PHYSICIAN: Dr. Conni Slipper  CHIEF COMPLAINT:   Chief Complaint  Patient presents with  . Shortness of Breath    HISTORY OF PRESENT ILLNESS:  Stanley [name]  is a 56 y.o. male with a known history of His renal disease on hemodialysis, alcohol abuse, COPD, history of coronary artery disease status post stent placement, hypertension, hyperlipidemia, medical noncompliance, history of liver cirrhosis who presented to the hospital due to shortness of breath. Patient says he has missed his hemodialysis for about a week as he was not feeling well. Patient has done this in the past repeatedly. He says over the past few days and shortness of breath has gotten progressively worse and become more weak and therefore called EMS to bring him to the hospital. Patient was noted to be acutely hyperkalemic with a potassium 6.3 he was noted to be in congestive heart failure and therefore hospitalist services were consulted for treatment and evaluation.  PAST MEDICAL HISTORY:   Past Medical History:  Diagnosis Date  . Alcohol abuse   . Cirrhosis ([place])   . Coronary artery disease 2009  . Diabetic peripheral neuropathy ([place])   . Drug abuse   . End stage renal disease on dialysis [facility]) NEPHROLOGIST-   DR [facility]  IN [place]   HEMODIALYSIS --   TUES/  THURS/  SAT  . GERD (gastroesophageal reflux disease)   . Hyperlipidemia   . Hypertension   . PAD (peripheral artery disease) ([place])   . Renal insufficiency    Per pt, 32 oz fluid restriction per day  . S/P triple vessel bypass 06/09/2016   2009ish  . Suicidal ideation    & HOMICIDAL IDEATION --  06-16-2013   ADMITTED TO BEHAVIOR HEALTH    PAST SURGICAL HISTORY:   Past Surgical History:  Procedure Laterality  Date  . AGILE CAPSULE N/A 06/19/2016   Procedure: AGILE CAPSULE;  Surgeon: [name], MD;  Location: [facility] ENDOSCOPY;  Service: Endoscopy;  Laterality: N/A;  . COLONOSCOPY WITH PROPOFOL N/A 06/18/2016   Procedure: COLONOSCOPY WITH PROPOFOL;  Surgeon: [name], MD;  Location: [facility] ENDOSCOPY;  Service: Endoscopy;  Laterality: N/A;  . COLONOSCOPY WITH PROPOFOL N/A 08/12/2016   Procedure: COLONOSCOPY WITH PROPOFOL;  Surgeon: [name], MD;  Location: [facility] ENDOSCOPY;  Service: Endoscopy;  Laterality: N/A;  . CORONARY ANGIOPLASTY  ?   PT UNABLE TO TELL IF  BEFORE OR AFTER  CABG  . CORONARY ARTERY BYPASS GRAFT  2008  ([place] , [place])   3 VESSEL  . DIALYSIS FISTULA CREATION  LAST SURGERY  APPOX  2008  . ENTEROSCOPY N/A 05/10/2016   Procedure: ENTEROSCOPY;  Surgeon: Jerene Bears, MD;  Location: [place];  Service: Gastroenterology;  Laterality: N/A;  . ENTEROSCOPY N/A 08/12/2016   Procedure: ENTEROSCOPY;  Surgeon: [name], MD;  Location: [facility] ENDOSCOPY;  Service: Endoscopy;  Laterality: N/A;  . ESOPHAGOGASTRODUODENOSCOPY N/A 05/07/2015   Procedure: ESOPHAGOGASTRODUODENOSCOPY (EGD);  Surgeon: Hulen Luster, MD;  Location: [facility] ENDOSCOPY;  Service: Endoscopy;  Laterality: N/A;  . ESOPHAGOGASTRODUODENOSCOPY (EGD) WITH PROPOFOL N/A 05/17/2015   Procedure: ESOPHAGOGASTRODUODENOSCOPY (EGD) WITH PROPOFOL;  Surgeon: [name], MD;  Location: [facility] ENDOSCOPY;  Service: Endoscopy;  Laterality: N/A;  . ESOPHAGOGASTRODUODENOSCOPY (EGD) WITH PROPOFOL N/A 01/20/2016   Procedure: ESOPHAGOGASTRODUODENOSCOPY (  EGD) WITH PROPOFOL;  Surgeon: [name], MD;  Location: [facility] ENDOSCOPY;  Service: Endoscopy;  Laterality: N/A;  . ESOPHAGOGASTRODUODENOSCOPY (EGD) WITH PROPOFOL N/A 04/17/2016   Procedure: ESOPHAGOGASTRODUODENOSCOPY (EGD) WITH PROPOFOL;  Surgeon: Lin Landsman, MD;  Location: [facility] ENDOSCOPY;  Service: Gastroenterology;  Laterality: N/A;  . ESOPHAGOGASTRODUODENOSCOPY (EGD) WITH PROPOFOL  05/09/2016   Procedure:  ESOPHAGOGASTRODUODENOSCOPY (EGD) WITH PROPOFOL;  Surgeon: Jerene Bears, MD;  Location: [place];  Service: Endoscopy;;  . ESOPHAGOGASTRODUODENOSCOPY (EGD) WITH PROPOFOL N/A 06/16/2016   Procedure: ESOPHAGOGASTRODUODENOSCOPY (EGD) WITH PROPOFOL;  Surgeon: [name], MD;  Location: [facility] ENDOSCOPY;  Service: Endoscopy;  Laterality: N/A;  . GIVENS CAPSULE STUDY N/A 05/07/2016   Procedure: GIVENS CAPSULE STUDY;  Surgeon: Doran Stabler, MD;  Location: [place];  Service: Endoscopy;  Laterality: N/A;  . MANDIBULAR HARDWARE REMOVAL N/A 07/29/2013   Procedure: REMOVAL OF ARCH BARS;  Surgeon: Theodoro Kos, DO;  Location: [place];  Service: Plastics;  Laterality: N/A;  . ORIF MANDIBULAR FRACTURE N/A 06/05/2013   Procedure: REPAIR OF MANDIBULAR FRACTURE x 2 with maxillo-mandibular fixation ;  Surgeon: Theodoro Kos, DO;  Location: [place];  Service: Plastics;  Laterality: N/A;  . PERIPHERAL ARTERIAL STENT GRAFT Left     SOCIAL HISTORY:   Social History  Substance Use Topics  . Smoking status: Current Every Day Smoker    Packs/day: 0.15    Years: 40.00    Types: Cigarettes  . Smokeless tobacco: Never Used  . Alcohol use No     Comment: pt reports quitting after learning about cirrhosis    FAMILY HISTORY:   Family History  Problem Relation Age of Onset  . Colon cancer Mother   . Cancer Father   . Cancer Sister   . Kidney disease Brother     DRUG ALLERGIES:  No Known Allergies  REVIEW OF SYSTEMS:   Review of Systems  Constitutional: Negative for fever and weight loss.  HENT: Negative for congestion, nosebleeds and tinnitus.   Eyes: Negative for blurred vision, double vision and redness.  Respiratory: Positive for shortness of breath. Negative for cough and hemoptysis.   Cardiovascular: Positive for leg swelling. Negative for chest pain, orthopnea and PND.  Gastrointestinal: Negative for abdominal pain, diarrhea, melena, nausea and vomiting.  Genitourinary:  Negative for dysuria, hematuria and urgency.  Musculoskeletal: Negative for falls and joint pain.  Neurological: Positive for weakness. Negative for dizziness, tingling, sensory change, focal weakness, seizures and headaches.  Endo/Heme/Allergies: Negative for polydipsia. Does not bruise/bleed easily.  Psychiatric/Behavioral: Negative for depression and memory loss. The patient is not nervous/anxious.     MEDICATIONS AT HOME:   Prior to Admission medications   Medication Sig Start Date End Date Taking? Authorizing Provider  albuterol (PROVENTIL HFA;VENTOLIN HFA) 108 (90 Base) MCG/ACT inhaler Inhale 4-6 puffs by mouth every 4 hours as needed for wheezing, cough, and/or shortness of breath 11/16/16   Vaughan Basta, MD  aspirin EC 81 MG tablet Take 1 tablet (81 mg total) by mouth daily. 11/16/16   Vaughan Basta, MD  atorvastatin (LIPITOR) 40 MG tablet Take 1 tablet (40 mg total) by mouth daily. 11/16/16   Vaughan Basta, MD  budesonide-formoterol (SYMBICORT) 160-4.5 MCG/ACT inhaler Inhale 2 puffs into the lungs daily. 11/16/16   Vaughan Basta, MD  furosemide (LASIX) 80 MG tablet Take 1 tablet (80 mg total) by mouth daily. 11/16/16   Vaughan Basta, MD  gabapentin (NEURONTIN) 300 MG capsule TAKE 1 CAPSULE BY MOUTH EVERY EVENING AND 1 EXTRA  CAP ON DIALYSIS DAYS AS NEEDED FOR NERVE PAIN 08/16/16   [provider]  ipratropium-albuterol (DUONEB) 0.5-2.5 (3) MG/3ML SOLN Take 3 mLs by nebulization every 6 (six) hours as needed. 10/30/16   Bettey Costa, MD  labetalol (NORMODYNE) 100 MG tablet Take 1 tablet (100 mg total) by mouth daily. 11/16/16   Vaughan Basta, MD  Multiple Vitamins-Minerals-FA (DIALYVITE SUPREME D) 3 MG TABS Take 1 tablet by mouth daily. 05/27/16   [provider]  nitroGLYCERIN (NITROSTAT) 0.4 MG SL tablet Place 1 tablet (0.4 mg total) under the tongue every 5 (five) minutes as needed. 04/12/16   Wende Bushy, MD  pantoprazole  (PROTONIX) 40 MG tablet Take 1 tablet (40 mg total) by mouth daily. Patient not taking: Reported on 11/15/2016 05/10/16   Jonetta Osgood, MD  sevelamer carbonate (RENVELA) 800 MG tablet Take 3 tablets (2,400 mg total) by mouth 3 (three) times daily with meals. 11/16/16   Vaughan Basta, MD  tiotropium (SPIRIVA HANDIHALER) 18 MCG inhalation capsule Place 1 capsule (18 mcg total) into inhaler and inhale daily. 11/16/16 11/16/17  Vaughan Basta, MD      VITAL SIGNS:  Blood pressure (!) 143/104, pulse 87, temperature 98.4 F (36.9 C), temperature source Oral, resp. rate 14, height 6\' 3"  (1.905 m), weight 81.6 kg (180 lb), SpO2 90 %.  PHYSICAL EXAMINATION:  Physical Exam  GENERAL:  56 y.o.-year-old patient lying in the bed In mild respiratory distress.  EYES: Pupils equal, round, reactive to light and accommodation. No scleral icterus. Extraocular muscles intact.  HEENT: Head atraumatic, normocephalic. Oropharynx and nasopharynx clear. No oropharyngeal erythema, moist oral mucosa  NECK:  Supple, no jugular venous distention. No thyroid enlargement, no tenderness.  LUNGS: Normal breath sounds bilaterally, rales midway up through the lung fields b/l, no rhonchi, wheezing. No use of accessory muscles of respiration.  CARDIOVASCULAR: S1, S2 RRR. No murmurs, rubs, gallops, clicks.  ABDOMEN: Soft, nontender, distended, + fluid wave consistent with ascites. Bowel sounds present. No organomegaly or mass.  EXTREMITIES: + 2 pedal edema b/l, No cyanosis, or clubbing. + 2 pedal & radial pulses b/l.   NEUROLOGIC: Cranial nerves II through XII are intact. No focal Motor or sensory deficits appreciated b/l PSYCHIATRIC: The patient is alert and oriented x 3.  SKIN: No obvious rash, lesion, or ulcer.   LABORATORY PANEL:   CBC  Recent Labs Lab 11/26/16 0633  WBC 6.9  HGB 10.8*  HCT 35.1*  PLT 286    ------------------------------------------------------------------------------------------------------------------  Chemistries   Recent Labs Lab 11/26/16 0633  NA 143  K 6.3*  CL 96*  CO2 31  GLUCOSE 87  BUN 52*  CREATININE 10.71*  CALCIUM 9.1  AST 31  ALT 21  ALKPHOS 141*  BILITOT 0.6   ------------------------------------------------------------------------------------------------------------------  Cardiac Enzymes  Recent Labs Lab 11/26/16 0633  TROPONINI 0.12*   ------------------------------------------------------------------------------------------------------------------  RADIOLOGY:  Dg Chest 1 View  Result Date: 11/26/2016 CLINICAL DATA:  Shortness of breath. EXAM: CHEST 1 VIEW COMPARISON:  November 15, 2016. FINDINGS: Unchanged cardiomegaly. Prior CABG. Mild pulmonary vascular congestion. Atherosclerotic calcification of the aortic arch. Bibasilar atelectasis. No focal consolidation, pleural effusion, or pneumothorax. No acute osseous abnormality. IMPRESSION: Cardiomegaly with mild pulmonary vascular congestion, unchanged. Electronically Signed   By: Titus Dubin M.D.   On: 11/26/2016 07:22     IMPRESSION AND PLAN:   56 year old male with past medical history of end-stage renal disease on hemodialysis, history of coronary artery disease status post stent placement, liver cirrhosis, alcohol abuse, hypertension,  hyperlipidemia, medical noncompliance, secondary hyperparathyroidism who presented to the hospital due to shortness of breath.  1. Acute respiratory failure with hypoxia-secondary to CHF and pulmonary edema from patient missing his hemodialysis for about a week. -Patient will go to urgent hemodialysis for fluid removal. Continue O2 supplementation and wean as tolerated.  2. CHF-acute on chronic diastolic dysfunction secondary to patient missing his hemodialysis for about a week. -Given Lasix in the ER, patient get urgent hemodialysis will follow  volume status. -Continue labetalol.  3. Hyperkalemia-secondary to the pt. Missing HD. - will get Urgent HD today.  No ECG changes. Follow potassium  4. [name]-we'll consult nephrology. Patient is noncompliant with his dialysis and has missed it for the past week.  5. Essential hypertension-continue labetalol, will place on as needed IV hydralazine.  6. COPD-no acute exacerbation. Continue Spiriva, Symbicort DuoNeb's as needed.  10. History of liver cirrhosis-secondary to alcohol abuse. Clinically patient does have evidence of ascites. We'll plan for a possible ultrasound-guided paracentesis in the next 1-2 days.  11. Neuropathy-continue gabapentin.  12. Secondary hyperparathyroidism-continue Renvela.  13. Hyperlipidemia-continue atorvastatin.    All the records are reviewed and case discussed with ED provider. Management plans discussed with the patient, family and they are in agreement.  CODE STATUS: Full code  TOTAL TIME TAKING CARE OF THIS PATIENT: 45 minutes.    Henreitta Leber M.D on 11/26/2016 at 8:52 AM  Between 7am to 6pm - Pager - [phone]  After 6pm go to [email or web address] - password [email or web address] [facility]  Office  [phone]  CC: Primary care physician; [name], MD

## 2016-11-26 NOTE — ED Provider Notes (Signed)
[facility] Emergency Department Provider Note   ____________________________________________   First MD Initiated Contact with Patient 11/26/16 [phone]     (approximate)  I have reviewed the triage vital signs and the nursing notes.   HISTORY  Chief Complaint Shortness of Breath    HPI Stanley [name] is a 56 y.o. male patient reports gradual onset of shortness of breath over several days. He's not had dialysis for a week usually gets it every other day. He does not have his ascites drained for over 2 weeks possibly 3 and usually gets it drained every other week. He is not complaining of anything else and affect is very sleepy when I wake him up he asked for breakfast.   Past Medical History:  Diagnosis Date  . Alcohol abuse   . Cirrhosis ([place])   . Coronary artery disease 2009  . Diabetic peripheral neuropathy ([place])   . Drug abuse   . End stage renal disease on dialysis [facility]) NEPHROLOGIST-   DR [facility]  IN [place]   HEMODIALYSIS --   TUES/  THURS/  SAT  . GERD (gastroesophageal reflux disease)   . Hyperlipidemia   . Hypertension   . PAD (peripheral artery disease) ([place])   . Renal insufficiency    Per pt, 32 oz fluid restriction per day  . S/P triple vessel bypass 06/09/2016   2009ish  . Suicidal ideation    & HOMICIDAL IDEATION --  06-16-2013   ADMITTED TO BEHAVIOR HEALTH    Patient Active Problem List   Diagnosis Date Noted  . Shortness of breath 11/26/2016  . COPD (chronic obstructive pulmonary disease) ([place]) 10/30/2016  . COPD exacerbation ([place]) 10/29/2016  . Anemia   . Heme positive stool   . Ulceration of intestine   . Benign neoplasm of transverse colon   . Acute gastrointestinal hemorrhage   . Esophageal candidiasis ([place])   . Angiodysplasia of intestinal tract   . Acute respiratory failure with hypoxia ([place]) 07/03/2016  . GI bleeding 06/24/2016  . Rectal bleeding 06/14/2016  . Anemia of chronic disease 06/01/2016  . MRSA  carrier 06/01/2016  . Chronic renal failure 05/23/2016  . Ischemic heart disease 05/23/2016  . Angiodysplasia of small intestine   . Melena   . Small bowel bleed not requiring more than 4 units of blood in 24 hours, ICU, or surgery   . Anemia due to chronic blood loss   . Abdominal pain 05/05/2016  . Acute posthemorrhagic anemia 04/17/2016  . Gastrointestinal bleed 04/17/2016  . History of esophagogastroduodenoscopy (EGD) 04/17/2016  . Elevated troponin 04/17/2016  . Alcohol abuse 04/17/2016  . Upper GI bleed 01/19/2016  . Blood in stool   . Angiodysplasia of stomach and duodenum with hemorrhage   . Gastritis   . Esophagitis, unspecified   . GI bleed 05/16/2015  . Acute GI bleeding   . Symptomatic anemia 04/30/2015  . HTN (hypertension) 04/06/2015  . GERD (gastroesophageal reflux disease) 04/06/2015  . HLD (hyperlipidemia) 04/06/2015  . Dyspnea 04/06/2015  . Cirrhosis of liver with ascites ([place]) 04/06/2015  . Ascites 04/06/2015  . GIB (gastrointestinal bleeding) 03/23/2015  . Homicidal ideation 06/19/2013  . Suicidal intent 06/19/2013  . Homicidal ideations 06/19/2013  . Hyperkalemia 06/16/2013  . Mandible fracture ([place]) 06/05/2013  . Fracture, mandible ([place]) 06/02/2013  . Coronary atherosclerosis of native coronary artery 06/02/2013  . ESRD on dialysis ([place]) 06/02/2013  . Mandible open fracture ([place]) 06/02/2013    Past Surgical History:  Procedure Laterality  Date  . Sonda Rumble CAPSULE N/A 06/19/2016   Procedure: AGILE CAPSULE;  Surgeon: [name], MD;  Location: [facility] ENDOSCOPY;  Service: Endoscopy;  Laterality: N/A;  . COLONOSCOPY WITH PROPOFOL N/A 06/18/2016   Procedure: COLONOSCOPY WITH PROPOFOL;  Surgeon: [name], MD;  Location: [facility] ENDOSCOPY;  Service: Endoscopy;  Laterality: N/A;  . COLONOSCOPY WITH PROPOFOL N/A 08/12/2016   Procedure: COLONOSCOPY WITH PROPOFOL;  Surgeon: [name], MD;  Location: [facility] ENDOSCOPY;  Service: Endoscopy;  Laterality: N/A;  . CORONARY  ANGIOPLASTY  ?   PT UNABLE TO TELL IF  BEFORE OR AFTER  CABG  . CORONARY ARTERY BYPASS GRAFT  2008  ([place] , [place])   3 VESSEL  . DIALYSIS FISTULA CREATION  LAST SURGERY  APPOX  2008  . ENTEROSCOPY N/A 05/10/2016   Procedure: ENTEROSCOPY;  Surgeon: Jerene Bears, MD;  Location: [place];  Service: Gastroenterology;  Laterality: N/A;  . ENTEROSCOPY N/A 08/12/2016   Procedure: ENTEROSCOPY;  Surgeon: [name], MD;  Location: [facility] ENDOSCOPY;  Service: Endoscopy;  Laterality: N/A;  . ESOPHAGOGASTRODUODENOSCOPY N/A 05/07/2015   Procedure: ESOPHAGOGASTRODUODENOSCOPY (EGD);  Surgeon: Hulen Luster, MD;  Location: [facility] ENDOSCOPY;  Service: Endoscopy;  Laterality: N/A;  . ESOPHAGOGASTRODUODENOSCOPY (EGD) WITH PROPOFOL N/A 05/17/2015   Procedure: ESOPHAGOGASTRODUODENOSCOPY (EGD) WITH PROPOFOL;  Surgeon: [name], MD;  Location: [facility] ENDOSCOPY;  Service: Endoscopy;  Laterality: N/A;  . ESOPHAGOGASTRODUODENOSCOPY (EGD) WITH PROPOFOL N/A 01/20/2016   Procedure: ESOPHAGOGASTRODUODENOSCOPY (EGD) WITH PROPOFOL;  Surgeon: [name], MD;  Location: [facility] ENDOSCOPY;  Service: Endoscopy;  Laterality: N/A;  . ESOPHAGOGASTRODUODENOSCOPY (EGD) WITH PROPOFOL N/A 04/17/2016   Procedure: ESOPHAGOGASTRODUODENOSCOPY (EGD) WITH PROPOFOL;  Surgeon: Lin Landsman, MD;  Location: [facility] ENDOSCOPY;  Service: Gastroenterology;  Laterality: N/A;  . ESOPHAGOGASTRODUODENOSCOPY (EGD) WITH PROPOFOL  05/09/2016   Procedure: ESOPHAGOGASTRODUODENOSCOPY (EGD) WITH PROPOFOL;  Surgeon: Jerene Bears, MD;  Location: [place];  Service: Endoscopy;;  . ESOPHAGOGASTRODUODENOSCOPY (EGD) WITH PROPOFOL N/A 06/16/2016   Procedure: ESOPHAGOGASTRODUODENOSCOPY (EGD) WITH PROPOFOL;  Surgeon: [name], MD;  Location: [facility] ENDOSCOPY;  Service: Endoscopy;  Laterality: N/A;  . GIVENS CAPSULE STUDY N/A 05/07/2016   Procedure: GIVENS CAPSULE STUDY;  Surgeon: Doran Stabler, MD;  Location: [place];  Service: Endoscopy;  Laterality: N/A;  .  MANDIBULAR HARDWARE REMOVAL N/A 07/29/2013   Procedure: REMOVAL OF ARCH BARS;  Surgeon: Theodoro Kos, DO;  Location: [place];  Service: Plastics;  Laterality: N/A;  . ORIF MANDIBULAR FRACTURE N/A 06/05/2013   Procedure: REPAIR OF MANDIBULAR FRACTURE x 2 with maxillo-mandibular fixation ;  Surgeon: Theodoro Kos, DO;  Location: [place];  Service: Plastics;  Laterality: N/A;  . PERIPHERAL ARTERIAL STENT GRAFT Left     Prior to Admission medications   Medication Sig Start Date End Date Taking? Authorizing Provider  albuterol (PROVENTIL HFA;VENTOLIN HFA) 108 (90 Base) MCG/ACT inhaler Inhale 4-6 puffs by mouth every 4 hours as needed for wheezing, cough, and/or shortness of breath 11/16/16  Yes Vaughan Basta, MD  aspirin EC 81 MG tablet Take 1 tablet (81 mg total) by mouth daily. 11/16/16  Yes Vaughan Basta, MD  atorvastatin (LIPITOR) 40 MG tablet Take 1 tablet (40 mg total) by mouth daily. 11/16/16  Yes Vaughan Basta, MD  budesonide-formoterol [facility]) 160-4.5 MCG/ACT inhaler Inhale 2 puffs into the lungs daily. 11/16/16  Yes Vaughan Basta, MD  furosemide (LASIX) 80 MG tablet Take 1 tablet (80 mg total) by mouth daily. 11/16/16  Yes Vaughan Basta, MD  gabapentin (NEURONTIN) 300 MG capsule TAKE  1 CAPSULE BY MOUTH EVERY EVENING AND 1 EXTRA CAP ON DIALYSIS DAYS AS NEEDED FOR NERVE PAIN 08/16/16  Yes [provider]  labetalol (NORMODYNE) 100 MG tablet Take 1 tablet (100 mg total) by mouth daily. 11/16/16  Yes Vaughan Basta, MD  nitroGLYCERIN (NITROSTAT) 0.4 MG SL tablet Place 1 tablet (0.4 mg total) under the tongue every 5 (five) minutes as needed. 04/12/16  Yes Wende Bushy, MD  sevelamer carbonate (RENVELA) 800 MG tablet Take 3 tablets (2,400 mg total) by mouth 3 (three) times daily with meals. 11/16/16  Yes Vaughan Basta, MD  tiotropium (SPIRIVA HANDIHALER) 18 MCG inhalation capsule Place 1 capsule (18 mcg total) into inhaler  and inhale daily. 11/16/16 11/16/17 Yes Vaughan Basta, MD  ipratropium-albuterol (DUONEB) 0.5-2.5 (3) MG/3ML SOLN Take 3 mLs by nebulization every 6 (six) hours as needed. Patient not taking: Reported on 11/26/2016 10/30/16   Bettey Costa, MD  Multiple Vitamins-Minerals-FA (DIALYVITE SUPREME D) 3 MG TABS Take 1 tablet by mouth daily. 05/27/16   [provider]  pantoprazole (PROTONIX) 40 MG tablet Take 1 tablet (40 mg total) by mouth daily. Patient not taking: Reported on 11/15/2016 05/10/16   Jonetta Osgood, MD    Allergies Patient has no known allergies.  Family History  Problem Relation Age of Onset  . Colon cancer Mother   . Cancer Father   . Cancer Sister   . Kidney disease Brother     Social History Social History  Substance Use Topics  . Smoking status: Current Every Day Smoker    Packs/day: 0.15    Years: 40.00    Types: Cigarettes  . Smokeless tobacco: Never Used  . Alcohol use No     Comment: pt reports quitting after learning about cirrhosis    Review of Systems  Constitutional: No fever/chills Eyes: No visual changes. ENT: No sore throat. Cardiovascular: Denies chest pain. Respiratory:shortness of breath. Gastrointestinal: No abdominal pain.  No nausea, no vomiting.  No diarrhea.  No constipation. Genitourinary: Negative for dysuria. Musculoskeletal: Negative for back pain. Skin: Negative for rash. Neurological: Negative for headaches, focal weakness   ____________________________________________   PHYSICAL EXAM:  VITAL SIGNS: ED Triage Vitals  Enc Vitals Group     BP 11/26/16 0630 (!) 156/93     Pulse Rate 11/26/16 0630 88     Resp 11/26/16 0630 18     Temp 11/26/16 0630 98.4 F (36.9 C)     Temp Source 11/26/16 0630 Oral     SpO2 11/26/16 0630 97 %     Weight 11/26/16 0631 180 lb (81.6 kg)     Height 11/26/16 0631 6\' 3"  (1.905 m)     Head Circumference --      Peak Flow --      Pain Score 11/26/16 0630 0     Pain Loc --       Pain Edu? --      Excl. in [place]? --     Constitutional: Alert and oriented. Well appearing and in no acute distress. Eyes: Conjunctivae are normal.  Head: Atraumatic. Nose: No congestion/rhinnorhea. Mouth/Throat: Mucous membranes are moist.  Oropharynx non-erythematous. Neck: No stridor. Cardiovascular: Normal rate, regular rhythm. Grossly normal heart sounds.  Good peripheral circulation. Respiratory: Normal respiratory effort.  No retractions. Lungs scattered crackles worsen bases Gastrointestinal: Soft and nontender. distention. No abdominal bruits. No CVA tenderness. Musculoskeletal: No lower extremity tenderness  Neurologic:  Normal speech and language. No gross focal neurologic deficits are appreciated.  Skin:  Skin  is warm, dry and intact. No rash noted. Psychiatric: Mood and affect are normal. Speech and behavior are normal.  ____________________________________________   LABS (all labs ordered are listed, but only abnormal results are displayed)  Labs Reviewed  BASIC METABOLIC PANEL - Abnormal; Notable for the following:       Result Value   Potassium 6.3 (*)    Chloride 96 (*)    BUN 52 (*)    Creatinine, Ser 10.71 (*)    GFR calc non Af Amer 5 (*)    GFR calc Af Amer 5 (*)    Anion gap 16 (*)    All other components within normal limits  CBC - Abnormal; Notable for the following:    Hemoglobin 10.8 (*)    HCT 35.1 (*)    MCV 76.8 (*)    MCH 23.7 (*)    MCHC 30.8 (*)    RDW 20.2 (*)    All other components within normal limits  TROPONIN I - Abnormal; Notable for the following:    Troponin I 0.12 (*)    All other components within normal limits  HEPATIC FUNCTION PANEL - Abnormal; Notable for the following:    Albumin 3.3 (*)    Alkaline Phosphatase 141 (*)    All other components within normal limits  BRAIN NATRIURETIC PEPTIDE - Abnormal; Notable for the following:    B Natriuretic Peptide 3,470.0 (*)    All other components within normal limits  MRSA PCR  SCREENING  HEPATITIS B SURFACE ANTIGEN   ____________________________________________  EKG  EKG normal sinus rhythm rate of 88 normal axis nonspecific ST-T wave changes ____________________________________________  RADIOLOGY  Chest x-ray shows large heart and congestive heart failure on my reading radiology reading is still pending ____________________________________________   PROCEDURES  Procedure(s) performed:  Procedures  Critical Care performed:   ____________________________________________   INITIAL IMPRESSION / ASSESSMENT AND PLAN / ED COURSE  Pertinent labs & imaging results that were available during my care of the patient were reviewed by me and considered in my medical decision making (see chart for details).    patient's potassium is elevated patient's troponin is twice what it usually is his chest x-ray looks like fluid overload I will call the renal doctor and try and get him dialyzed    ____________________________________________   FINAL CLINICAL IMPRESSION(S) / ED DIAGNOSES  Final diagnoses:  Hyperkalemia  Elevated troponin  Acute on chronic congestive heart failure, unspecified heart failure type ([place])      NEW MEDICATIONS STARTED DURING THIS VISIT:  Current Discharge Medication List       Note:  This document was prepared using Dragon voice recognition software and may include unintentional dictation errors.    Nena Polio, MD 11/26/16 1534

## 2016-11-26 NOTE — Progress Notes (Signed)
POST DIALYSIS ASSESSMENT 

## 2016-11-26 NOTE — Progress Notes (Signed)
[place], [place] 11/26/16  Subjective:   Patient known to our practice from outpatient dialysis. He presents after missing dialysis for almost a week Presenting potassium is critically elevated at  6.3 BNP is elevated, patient has abdominal distention He is admitted for further evaluation and management and urgent dialysis is requested   HEMODIALYSIS FLOWSHEET:  Blood Flow Rate (mL/min): 400 mL/min Arterial Pressure (mmHg): 160 mmHg Venous Pressure (mmHg): 190 mmHg Transmembrane Pressure (mmHg): 70 mmHg Ultrafiltration Rate (mL/min): 570 mL/min Dialysate Flow Rate (mL/min): 800 ml/min Conductivity: Machine : 13.8 Conductivity: Machine : 13.8 Dialysis Fluid Bolus: Normal Saline Bolus Amount (mL): 200 mL    Objective:  Vital signs in last 24 hours:  Temp:  [98.4 F (36.9 C)-98.5 F (36.9 C)] 98.5 F (36.9 C) (09/15 1115) Pulse Rate:  [83-92] 90 (09/15 1230) Resp:  [14-23] 14 (09/15 1230) BP: (123-173)/(76-104) 158/95 (09/15 1230) SpO2:  [90 %-100 %] 99 % (09/15 1230) Weight:  [81.6 kg (180 lb)] 81.6 kg (180 lb) (09/15 0631)  Weight change:  Filed Weights   11/26/16 0631  Weight: 81.6 kg (180 lb)    Intake/Output:   No intake or output data in the 24 hours ending 11/26/16 1246   Physical Exam: General: Laying in the bed, no acute distress   HEENT Moist oral mucous membranes   Neck No masses   Pulm/lungs Normal breathing effort, clear to auscultation   CVS/Heart Regular rate and rhythm   Abdomen:  Distended, ascites   Extremities: 1+ edema bilaterally   Neurologic: Resting quietly   Skin: No acute rashes   Access: Right upper arm AV fistula        Basic Metabolic Panel:   Recent Labs Lab 11/26/16 0633  NA 143  K 6.3*  CL 96*  CO2 31  GLUCOSE 87  BUN 52*  CREATININE 10.71*  CALCIUM 9.1     CBC:  Recent Labs Lab 11/26/16 0633  WBC 6.9  HGB 10.8*  HCT 35.1*  MCV 76.8*  PLT 286     Lab Results   Component Value Date   HEPBSAG Negative 08/09/2016   HEPBSAB Reactive 06/01/2016   HEPBIGM Negative 06/01/2016      Microbiology:  No results found for this or any previous visit (from the past 240 hour(s)).  Coagulation Studies: No results for input(s): LABPROT, INR in the last 72 hours.  Urinalysis: No results for input(s): COLORURINE, LABSPEC, PHURINE, GLUCOSEU, HGBUR, BILIRUBINUR, KETONESUR, PROTEINUR, UROBILINOGEN, NITRITE, LEUKOCYTESUR in the last 72 hours.  Invalid input(s): APPERANCEUR    Imaging: Dg Chest 1 View  Result Date: 11/26/2016 CLINICAL DATA:  Shortness of breath. EXAM: CHEST 1 VIEW COMPARISON:  November 15, 2016. FINDINGS: Unchanged cardiomegaly. Prior CABG. Mild pulmonary vascular congestion. Atherosclerotic calcification of the aortic arch. Bibasilar atelectasis. No focal consolidation, pleural effusion, or pneumothorax. No acute osseous abnormality. IMPRESSION: Cardiomegaly with mild pulmonary vascular congestion, unchanged. Electronically Signed   By: Titus Dubin M.D.   On: 11/26/2016 07:22     Medications:    . aspirin EC  81 mg Oral Daily  . atorvastatin  40 mg Oral Daily  . furosemide  80 mg Oral Daily  . gabapentin  300 mg Oral QHS  . heparin  5,000 Units Subcutaneous Q8H  . labetalol  100 mg Oral Daily  . mometasone-formoterol  2 puff Inhalation BID  . multivitamin  1 tablet Oral Daily  . sevelamer carbonate  2,400 mg Oral TID WC  . [START ON 11/27/2016]  tiotropium  18 mcg Inhalation Daily   acetaminophen **OR** acetaminophen, hydrALAZINE, ipratropium-albuterol, nitroGLYCERIN, ondansetron **OR** ondansetron (ZOFRAN) IV  Assessment/ Plan:  56 y.o. 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  End-stage renal disease. 2. Severe hyperkalemia 3. Hypertension 4. Anemia of chronic kidney disease 5. Ascites secondary to end-stage liver disease and cirrhosis.  Urgent dialysis performed this morning. Low potassium bath. Recommend low potassium diet Patient's abdomen is distended. He gets almost weekly paracentesis as outpatient Last paracentesis was 7.5 L on September 5 Continue sevelamer at home dose for phos binding   LOS: 0 [facility] 9/15/201812:46 PM  [place], [place]

## 2016-11-27 LAB — CBC
HCT: 29.4 % — ABNORMAL LOW (ref 40.0–52.0)
HEMOGLOBIN: 9.3 g/dL — AB (ref 13.0–18.0)
MCH: 24 pg — ABNORMAL LOW (ref 26.0–34.0)
MCHC: 31.7 g/dL — ABNORMAL LOW (ref 32.0–36.0)
MCV: 75.5 fL — ABNORMAL LOW (ref 80.0–100.0)
PLATELETS: 251 10*3/uL (ref 150–440)
RBC: 3.89 MIL/uL — AB (ref 4.40–5.90)
RDW: 19.7 % — ABNORMAL HIGH (ref 11.5–14.5)
WBC: 5.6 10*3/uL (ref 3.8–10.6)

## 2016-11-27 LAB — BASIC METABOLIC PANEL
ANION GAP: 10 (ref 5–15)
BUN: 32 mg/dL — ABNORMAL HIGH (ref 6–20)
CALCIUM: 8 mg/dL — AB (ref 8.9–10.3)
CO2: 32 mmol/L (ref 22–32)
CREATININE: 7.17 mg/dL — AB (ref 0.61–1.24)
Chloride: 99 mmol/L — ABNORMAL LOW (ref 101–111)
GFR, EST AFRICAN AMERICAN: 9 mL/min — AB (ref >60)
GFR, EST NON AFRICAN AMERICAN: 8 mL/min — AB (ref >60)
Glucose, Bld: 88 mg/dL (ref 65–99)
Potassium: 5.4 mmol/L — ABNORMAL HIGH (ref 3.5–5.1)
SODIUM: 141 mmol/L (ref 135–145)

## 2016-11-27 LAB — GLUCOSE, CAPILLARY: GLUCOSE-CAPILLARY: 124 mg/dL — AB (ref 65–99)

## 2016-11-27 LAB — HEPATITIS B SURFACE ANTIGEN: Hepatitis B Surface Ag: NEGATIVE

## 2016-11-27 MED ORDER — LACTULOSE 10 GM/15ML PO SOLN
30.0000 g | Freq: Every day | ORAL | Status: DC | PRN
Start: 2016-11-27 — End: 2016-11-28
  Administered 2016-11-27: 30 g via ORAL
  Filled 2016-11-27: qty 60

## 2016-11-27 MED ORDER — DOCUSATE SODIUM 100 MG PO CAPS
100.0000 mg | ORAL_CAPSULE | Freq: Two times a day (BID) | ORAL | Status: DC
  Administered 2016-11-27: 100 mg via ORAL
  Filled 2016-11-27: qty 1

## 2016-11-27 MED ORDER — EPOETIN ALFA 10000 UNIT/ML IJ SOLN: 4000.0000 [IU] | INTRAMUSCULAR | Status: DC

## 2016-11-27 NOTE — Progress Notes (Signed)
[facility] [facility] [place] NAME: Stanley [name]    MR#:  [id number]  DATE OF BIRTH:  [birth year]  SUBJECTIVE:   Patient here due to shortness of breath, hyperkalemia secondary to patient missing hemodialysis for all week. Status post emergent dialysis yesterday and potassium level has improved. Shortness of breath is also improved. Patient denies any complaints presently.  REVIEW OF SYSTEMS:    Review of Systems  Constitutional: Negative for chills and fever.  HENT: Negative for congestion and tinnitus.   Eyes: Negative for blurred vision and double vision.  Respiratory: Negative for cough, shortness of breath and wheezing.   Cardiovascular: Negative for chest pain, orthopnea and PND.  Gastrointestinal: Negative for abdominal pain, diarrhea, nausea and vomiting.  Genitourinary: Negative for dysuria and hematuria.  Neurological: Negative for dizziness, sensory change and focal weakness.  All other systems reviewed and are negative.   Nutrition: Renal w/ fluid restriction.  Tolerating Diet: Yes Tolerating PT: Await Eval.   DRUG ALLERGIES:  No Known Allergies  VITALS:  Blood pressure 125/80, pulse 81, temperature 98.7 F (37.1 C), temperature source Oral, resp. rate 18, height 6\' 3"  (1.905 m), weight 81.6 kg (180 lb), SpO2 93 %.  PHYSICAL EXAMINATION:   Physical Exam  GENERAL:  56 y.o.-year-old patient lying in bed in no acute distress.  EYES: Pupils equal, round, reactive to light and accommodation. No scleral icterus. Extraocular muscles intact.  HEENT: Head atraumatic, normocephalic. Oropharynx and nasopharynx clear.  NECK:  Supple, no jugular venous distention. No thyroid enlargement, no tenderness.  LUNGS: Normal breath sounds bilaterally, no wheezing, rales, rhonchi. No use of accessory muscles of respiration.  CARDIOVASCULAR: S1, S2 normal. No murmurs, rubs, or gallops.  ABDOMEN: Soft, nontender, distended, + fluid wave consistent  with Ascites. Bowel sounds present. No organomegaly or mass.  EXTREMITIES: No cyanosis, clubbing or edema b/l.    NEUROLOGIC: Cranial nerves II through XII are intact. No focal Motor or sensory deficits b/l.   PSYCHIATRIC: The patient is alert and oriented x 3.  SKIN: No obvious rash, lesion, or ulcer.   Right upper ext. AV fistula with good bruit/thriill  LABORATORY PANEL:   CBC  Recent Labs Lab 11/27/16 0511  WBC 5.6  HGB 9.3*  HCT 29.4*  PLT 251   ------------------------------------------------------------------------------------------------------------------  Chemistries   Recent Labs Lab 11/26/16 0633 11/26/16 1726 11/27/16 0511  NA 143  --  141  K 6.3* 4.3 5.4*  CL 96*  --  99*  CO2 31  --  32  GLUCOSE 87  --  88  BUN 52*  --  32*  CREATININE 10.71*  --  7.17*  CALCIUM 9.1  --  8.0*  MG  --  1.9  --   AST 31  --   --   ALT 21  --   --   ALKPHOS 141*  --   --   BILITOT 0.6  --   --    ------------------------------------------------------------------------------------------------------------------  Cardiac Enzymes  Recent Labs Lab 11/26/16 0633  TROPONINI 0.12*   ------------------------------------------------------------------------------------------------------------------  RADIOLOGY:  Dg Chest 1 View  Result Date: 11/26/2016 CLINICAL DATA:  Shortness of breath. EXAM: CHEST 1 VIEW COMPARISON:  November 15, 2016. FINDINGS: Unchanged cardiomegaly. Prior CABG. Mild pulmonary vascular congestion. Atherosclerotic calcification of the aortic arch. Bibasilar atelectasis. No focal consolidation, pleural effusion, or pneumothorax. No acute osseous abnormality. IMPRESSION: Cardiomegaly with mild pulmonary vascular congestion, unchanged. Electronically Signed   By: Orville Govern.D.  On: 11/26/2016 07:22     ASSESSMENT AND PLAN:   56 year old male with past medical history of end-stage renal disease on hemodialysis, history of coronary artery disease  status post stent placement, liver cirrhosis, alcohol abuse, hypertension, hyperlipidemia, medical noncompliance, secondary hyperparathyroidism who presented to the hospital due to shortness of breath.  1. Acute respiratory failure-secondary to CHF and pulmonary edema from patient missing his hemodialysis for about a week. with hypoxia-secondary to CHF and pulmonary edema from patient missing his hemodialysis for about a week. -patient is status post urgent hemodialysis yesterday and his hypoxia and shortness of breath is improved.  2. CHF-acute on chronic diastolic dysfunction secondary to patient missing his hemodialysis for about a week. --status post urgent hemodialysis and volume status is improved. We'll continue to monitor. Continue labetalol.  3. Hyperkalemia-secondary to the pt. Missing HD. - s/p HD yesterday and Potassium level improved. Cont. To monitor. Keep on Tele.  - follow potassium   4. [name]- appreciate Nephro consult and s/p HD yesterday as missed it for a week.  - cont. Dialysis on MWF schedule.   5. Essential hypertension-continue labetalol,  - PRN Hydralazine and BP stable.   6. COPD-no acute exacerbation. Continue Spiriva, Symbicort DuoNeb's as needed.  10. History of liver cirrhosis-secondary to alcohol abuse. Clinically patient does have evidence of ascites.  - will plan for US guided Paracentesis.   11. Neuropathy- continue gabapentin.  12. Secondary hyperparathyroidism- continue Renvela.  13. Hyperlipidemia-  continue atorvastatin.     All the records are reviewed and case discussed with Care Management/Social Worker. Management plans discussed with the patient, family and they are in agreement.  CODE STATUS: Full code  DVT Prophylaxis: Hep. SQ  TOTAL TIME TAKING CARE OF THIS PATIENT: 30 minutes.   POSSIBLE D/C IN 1-2 DAYS, DEPENDING ON CLINICAL CONDITION.   Henreitta Leber M.D on 11/27/2016 at 11:41 AM  Between 7am to 6pm - Pager - [phone]  After 6pm go to [email or web address] - [employer]  [employer] [facility]  Office  [phone]  CC: Primary care physician; [name], MD

## 2016-11-27 NOTE — Progress Notes (Signed)
[place], [place] 11/27/16  Subjective:   Patient completed his dialysis successfully yesterday. 1500 cc of fluid was removed Post dialysis, potassium was 4.3. Skin noted to be slightly high today at 5.4 Patient does not complain of any acute shortness of breath. He is able to eat without nausea or vomiting Abdomen is distended with ascites  Objective:  Vital signs in last 24 hours:  Temp:  [98.1 F (36.7 C)-98.8 F (37.1 C)] 98.1 F (36.7 C) (09/16 1210) Pulse Rate:  [78-92] 79 (09/16 1210) Resp:  [16-20] 19 (09/16 1210) BP: (110-151)/(64-107) 126/77 (09/16 1210) SpO2:  [91 %-100 %] 91 % (09/16 1210)  Weight change:  Filed Weights   11/26/16 0631  Weight: 81.6 kg (180 lb)    Intake/Output:    Intake/Output Summary (Last 24 hours) at 11/27/16 1300 Last data filed at 11/27/16 1028  Gross per 24 hour  Intake              680 ml  Output             1500 ml  Net             -820 ml     Physical Exam: General: Laying in the bed, no acute distress   HEENT Moist oral mucous membranes   Neck No masses   Pulm/lungs Normal breathing effort, clear to auscultation   CVS/Heart Regular rate and rhythm   Abdomen:  Distended, ascites   Extremities: 1+ edema bilaterally   Neurologic: Resting quietly   Skin: No acute rashes   Access: Right upper arm AV fistula        Basic Metabolic Panel:   Recent Labs Lab 11/26/16 0633 11/26/16 1726 11/27/16 0511  NA 143  --  141  K 6.3* 4.3 5.4*  CL 96*  --  99*  CO2 31  --  32  GLUCOSE 87  --  88  BUN 52*  --  32*  CREATININE 10.71*  --  7.17*  CALCIUM 9.1  --  8.0*  MG  --  1.9  --      CBC:  Recent Labs Lab 11/26/16 0633 11/27/16 0511  WBC 6.9 5.6  HGB 10.8* 9.3*  HCT 35.1* 29.4*  MCV 76.8* 75.5*  PLT 286 251      Lab Results  Component Value Date   HEPBSAG Negative 11/26/2016   HEPBSAB Reactive 06/01/2016   HEPBIGM Negative 06/01/2016      Microbiology:  Recent  Results (from the past 240 hour(s))  MRSA PCR Screening     Status: None   Collection Time: 11/26/16  3:30 PM  Result Value Ref Range Status   MRSA by PCR NEGATIVE NEGATIVE Final    Comment:        The GeneXpert MRSA Assay (FDA approved for NASAL specimens only), is one component of a comprehensive MRSA colonization surveillance program. It is not intended to diagnose MRSA infection nor to guide or monitor treatment for MRSA infections.     Coagulation Studies: No results for input(s): LABPROT, INR in the last 72 hours.  Urinalysis: No results for input(s): COLORURINE, LABSPEC, PHURINE, GLUCOSEU, HGBUR, BILIRUBINUR, KETONESUR, PROTEINUR, UROBILINOGEN, NITRITE, LEUKOCYTESUR in the last 72 hours.  Invalid input(s): APPERANCEUR    Imaging: Dg Chest 1 View  Result Date: 11/26/2016 CLINICAL DATA:  Shortness of breath. EXAM: CHEST 1 VIEW COMPARISON:  November 15, 2016. FINDINGS: Unchanged cardiomegaly. Prior CABG. Mild pulmonary vascular congestion. Atherosclerotic calcification of the aortic arch.  Bibasilar atelectasis. No focal consolidation, pleural effusion, or pneumothorax. No acute osseous abnormality. IMPRESSION: Cardiomegaly with mild pulmonary vascular congestion, unchanged. Electronically Signed   By: Titus Dubin M.D.   On: 11/26/2016 07:22     Medications:    . aspirin EC  81 mg Oral Daily  . atorvastatin  40 mg Oral Daily  . furosemide  80 mg Oral Daily  . gabapentin  300 mg Oral QHS  . heparin  5,000 Units Subcutaneous Q8H  . labetalol  100 mg Oral Daily  . mometasone-formoterol  2 puff Inhalation BID  . multivitamin  1 tablet Oral Daily  . sevelamer carbonate  2,400 mg Oral TID WC  . tiotropium  18 mcg Inhalation Daily   acetaminophen **OR** acetaminophen, alum & mag hydroxide-simeth, gi cocktail, hydrALAZINE, ipratropium-albuterol, nitroGLYCERIN, ondansetron **OR** ondansetron (ZOFRAN) IV  Assessment/ Plan:  56 y.o. male with end stage renal disease on  hemodialysis secondary to Alport's syndrome, end stage liver disease with hepatic cirrhosis and ascites, hypertension, anemia of chronic kidney disease, coronary artery disease, peripheral vascular disease, hyperlipidemia  [facility] AVF  1. End-stage renal disease. 2. Severe hyperkalemia 3. Hypertension 4. Anemia of chronic kidney disease 5. Ascites secondary to end-stage liver disease and cirrhosis.  Next hemodialysis planned for Monday as an extra treatment  Recommend low potassium diet Patient's abdomen is distended. He gets almost weekly paracentesis as outpatient Last paracentesis was 7.5 L on September 5 Continue sevelamer at home dose for phos binding Patient's dialyzer tended to clot on Saturday, may need to use IV heparin during dialysis.   LOS: 1 Stanley [name] 9/16/20181:00 PM  [place], [place]

## 2016-11-28 ENCOUNTER — Inpatient Hospital Stay: Payer: Medicare Other

## 2016-11-28 LAB — BASIC METABOLIC PANEL
ANION GAP: 12 (ref 5–15)
BUN: 41 mg/dL — ABNORMAL HIGH (ref 6–20)
CALCIUM: 8.2 mg/dL — AB (ref 8.9–10.3)
CO2: 34 mmol/L — ABNORMAL HIGH (ref 22–32)
Chloride: 94 mmol/L — ABNORMAL LOW (ref 101–111)
Creatinine, Ser: 8.59 mg/dL — ABNORMAL HIGH (ref 0.61–1.24)
GFR, EST AFRICAN AMERICAN: 7 mL/min — AB (ref >60)
GFR, EST NON AFRICAN AMERICAN: 6 mL/min — AB (ref >60)
Glucose, Bld: 90 mg/dL (ref 65–99)
Potassium: 5.1 mmol/L (ref 3.5–5.1)
Sodium: 140 mmol/L (ref 135–145)

## 2016-11-28 MED ORDER — DIALYVITE SUPREME D 3 MG PO TABS: 1.0000 | ORAL_TABLET | Freq: Every day | ORAL | 0 refills | Status: DC

## 2016-11-28 MED ORDER — PREDNISONE 20 MG PO TABS
40.0000 mg | ORAL_TABLET | Freq: Every day | ORAL | Status: DC
  Administered 2016-11-28: 40 mg via ORAL
  Filled 2016-11-28: qty 2

## 2016-11-28 MED ORDER — PANTOPRAZOLE SODIUM 40 MG PO TBEC: 40.0000 mg | DELAYED_RELEASE_TABLET | Freq: Every day | ORAL | 0 refills | Status: DC

## 2016-11-28 MED ORDER — PREDNISONE 10 MG PO TABS: ORAL_TABLET | ORAL | 0 refills | Status: DC

## 2016-11-28 MED ORDER — IPRATROPIUM-ALBUTEROL 0.5-2.5 (3) MG/3ML IN SOLN: 3.0000 mL | Freq: Four times a day (QID) | RESPIRATORY_TRACT | 0 refills | Status: DC | PRN

## 2016-11-28 NOTE — Care Management (Signed)
Amanda Morris HD liaison notified of admission.  

## 2016-11-28 NOTE — Progress Notes (Signed)
Patient came back from paracentesis and wanted to leave right away. He was educated by this nurse that he needs dialysis today but pt. refused. He stated " I am going tomorrow." Discharge instructions given including the importance of paracentesis in two-weeks. He was aware that he needs to pick paracentesis script form dialysis center for his paracentesis appt. Pt.  left the floor via volunteer. Charlie at dialyis was notified that pt. Left aleady

## 2016-11-28 NOTE — Discharge Summary (Signed)
[place] [facility] [place] NAME: Stanley [name]    MR#:  [id number]  DATE OF BIRTH:  01-11-1961  DATE OF ADMISSION:  11/26/2016 ADMITTING PHYSICIAN: Henreitta Leber, MD  DATE OF DISCHARGE: 11/28/2016 12:16 PM  PRIMARY CARE PHYSICIAN: [name], MD    ADMISSION DIAGNOSIS:  Hyperkalemia [E87.5] Elevated troponin [R74.8] Acute on chronic congestive heart failure, unspecified heart failure type ([place]) [I50.9]  DISCHARGE DIAGNOSIS:  Active Problems:   Shortness of breath   SECONDARY DIAGNOSIS:   Past Medical History:  Diagnosis Date  . Alcohol abuse   . Cirrhosis ([place])   . Coronary artery disease 2009  . Diabetic peripheral neuropathy ([place])   . Drug abuse   . End stage renal disease on dialysis [facility]) NEPHROLOGIST-   [facility]  IN [place]   HEMODIALYSIS --   TUES/  THURS/  SAT  . GERD (gastroesophageal reflux disease)   . Hyperlipidemia   . Hypertension   . PAD (peripheral artery disease) ([place])   . Renal insufficiency    Per pt, 32 oz fluid restriction per day  . S/P triple vessel bypass 06/09/2016   2009ish  . Suicidal ideation    & HOMICIDAL IDEATION --  06-16-2013   ADMITTED TO [facility] COURSE:   1.  Acute respiratory failure with hypoxia secondary to fluid overload and pulmonary edema from missing hemodialysis for about a week. Patient had urgent hemodialysis on presentation. Patient breathing comfortably on room air. 2. Acute diastolic congestive heart failure and fluid overload from noncompliance with hemodialysis as outpatient. 3. Hyperkalemia secondary to missed dialysis improved with dialysis. 4. [name]. Patient left today prior to getting dialysis done. 5. Liver cirrhosis and ascites. Patient had a paracentesis today wanted to go home afterwards. 6. Neuropathy on gabapentin 7. Hyperlipidemia unspecified on atorvastatin 8. COPD exacerbation prednisone taper.  Continue inhalers.  Patient wants to do things on his schedule. He comes in for paracentesis and stat dialysis after missing dialysis. I advised the patient that people that keep on coming into the hospital, their mortality goes up quite a bit. Patient left before dialysis today. This is unfortunate. He must be compliant with outpatient dialysis.  Case discussed with Dr. Holley Raring nephrology to try to set up outpatient paracentesis for him.  DISCHARGE CONDITIONS:   fair  CONSULTS OBTAINED:  Treatment Team:  Murlean Iba, MD  DRUG ALLERGIES:  No Known Allergies  DISCHARGE MEDICATIONS:   Discharge Medication List as of 11/28/2016  8:35 AM    START taking these medications   Details  predniSONE (DELTASONE) 10 MG tablet 4 tabs daily for 4 days, Print      CONTINUE these medications which have CHANGED   Details  ipratropium-albuterol (DUONEB) 0.5-2.5 (3) MG/3ML SOLN Take 3 mLs by nebulization every 6 (six) hours as needed., Starting Mon 11/28/2016, Print    Multiple Vitamins-Minerals-FA (DIALYVITE SUPREME D) 3 MG TABS Take 1 tablet (3 mg total) by mouth daily., Starting Mon 11/28/2016, Print    pantoprazole (PROTONIX) 40 MG tablet Take 1 tablet (40 mg total) by mouth daily., Starting Mon 11/28/2016, Print      CONTINUE these medications which have NOT CHANGED   Details  albuterol (PROVENTIL HFA;VENTOLIN HFA) 108 (90 Base) MCG/ACT inhaler Inhale 4-6 puffs by mouth every 4 hours as needed for wheezing, cough, and/or shortness of breath, Print    aspirin EC 81 MG tablet Take 1 tablet (81 mg  total) by mouth daily., Starting Wed 11/16/2016, Print    atorvastatin (LIPITOR) 40 MG tablet Take 1 tablet (40 mg total) by mouth daily., Starting Wed 11/16/2016, Print    budesonide-formoterol (SYMBICORT) 160-4.5 MCG/ACT inhaler Inhale 2 puffs into the lungs daily., Starting Wed 11/16/2016, Print    furosemide (LASIX) 80 MG tablet Take 1 tablet (80 mg total) by mouth daily., Starting Wed 11/16/2016,  Print    gabapentin (NEURONTIN) 300 MG capsule TAKE 1 CAPSULE BY MOUTH EVERY EVENING AND 1 EXTRA CAP ON DIALYSIS DAYS AS NEEDED FOR NERVE PAIN, Historical Med    labetalol (NORMODYNE) 100 MG tablet Take 1 tablet (100 mg total) by mouth daily., Starting Wed 11/16/2016, Print    nitroGLYCERIN (NITROSTAT) 0.4 MG SL tablet Place 1 tablet (0.4 mg total) under the tongue every 5 (five) minutes as needed., Starting Tue 04/12/2016, Normal    sevelamer carbonate (RENVELA) 800 MG tablet Take 3 tablets (2,400 mg total) by mouth 3 (three) times daily with meals., Starting Wed 11/16/2016, Print    tiotropium (SPIRIVA HANDIHALER) 18 MCG inhalation capsule Place 1 capsule (18 mcg total) into inhaler and inhale daily., Starting Wed 11/16/2016, Until Thu 11/16/2017, Print         DISCHARGE INSTRUCTIONS:   fair  If you experience worsening of your admission symptoms, develop shortness of breath, life threatening emergency, suicidal or homicidal thoughts you must seek medical attention immediately by calling 911 or calling your MD immediately  if symptoms less severe.  You Must read complete instructions/literature along with all the possible adverse reactions/side effects for all the Medicines you take and that have been prescribed to you. Take any new Medicines after you have completely understood and accept all the possible adverse reactions/side effects.   Please note  You were cared for by a hospitalist during your hospital stay. If you have any questions about your discharge medications or the care you received while you were in the hospital after you are discharged, you can call the unit and asked to speak with the hospitalist on call if the hospitalist that took care of you is not available. Once you are discharged, your primary care physician will handle any further medical issues. Please note that NO REFILLS for any discharge medications will be authorized once you are discharged, as it is imperative that  you return to your primary care physician (or establish a relationship with a primary care physician if you do not have one) for your aftercare needs so that they can reassess your need for medications and monitor your lab values.    Today   CHIEF COMPLAINT:   Chief Complaint  Patient presents with  . Shortness of Breath    HISTORY OF PRESENT ILLNESS:  Kymoni Monday  is a 56 y.o. male came in with shortness of breath secondary to being noncompliant with outpatient dialysis   VITAL SIGNS:  Blood pressure 131/85, pulse 85, temperature 98.4 F (36.9 C), temperature source Oral, resp. rate 20, height 6\' 3"  (1.905 m), weight 81.6 kg (180 lb), SpO2 100 %.    PHYSICAL EXAMINATION:  GENERAL:  56 y.o.-year-old patient lying in the bed with no acute distress.  EYES: Pupils equal, round, reactive to light and accommodation. No scleral icterus. Extraocular muscles intact.  HEENT: Head atraumatic, normocephalic. Oropharynx and nasopharynx clear.  NECK:  Supple, no jugular venous distention. No thyroid enlargement, no tenderness.  LUNGS: Normal breath sounds bilaterally, slight expiratory wheezing. norales,rhonchi or crepitation. No use of accessory muscles of respiration.  CARDIOVASCULAR: S1, S2 normal. No murmurs, rubs, or gallops.  ABDOMEN: Soft, non-tender, non-distended. Bowel sounds present. No organomegaly or mass.  EXTREMITIES: trace edema, no cyanosis, or clubbing.  NEUROLOGIC: Cranial nerves II through XII are intact. Muscle strength 5/5 in all extremities. Sensation intact. Gait not checked.  PSYCHIATRIC: The patient is alert and oriented x 3.  SKIN: No obvious rash, lesion, or ulcer.   DATA REVIEW:   CBC  Recent Labs Lab 11/27/16 0511  WBC 5.6  HGB 9.3*  HCT 29.4*  PLT 251    Chemistries   Recent Labs Lab 11/26/16 0633 11/26/16 1726  11/28/16 0424  NA 143  --   < > 140  K 6.3* 4.3  < > 5.1  CL 96*  --   < > 94*  CO2 31  --   < > 34*  GLUCOSE 87  --   < > 90   BUN 52*  --   < > 41*  CREATININE 10.71*  --   < > 8.59*  CALCIUM 9.1  --   < > 8.2*  MG  --  1.9  --   --   AST 31  --   --   --   ALT 21  --   --   --   ALKPHOS 141*  --   --   --   BILITOT 0.6  --   --   --   < > = values in this interval not displayed.  Cardiac Enzymes  Recent Labs Lab 11/26/16 0633  TROPONINI 0.12*    Microbiology Results  Results for orders placed or performed during the hospital encounter of 11/26/16  MRSA PCR Screening     Status: None   Collection Time: 11/26/16  3:30 PM  Result Value Ref Range Status   MRSA by PCR NEGATIVE NEGATIVE Final    Comment:        The GeneXpert MRSA Assay (FDA approved for NASAL specimens only), is one component of a comprehensive MRSA colonization surveillance program. It is not intended to diagnose MRSA infection nor to guide or monitor treatment for MRSA infections.     RADIOLOGY:  US Paracentesis  Result Date: 11/28/2016 INDICATION: Recurrent ascites. EXAM: ULTRASOUND GUIDED PARACENTESIS MEDICATIONS: None. COMPLICATIONS: None immediate. PROCEDURE: Informed written consent was obtained from the patient after a discussion of the risks, benefits and alternatives to treatment. A timeout was performed prior to the initiation of the procedure. Initial ultrasound scanning demonstrates a large amount of ascites within the left lower abdominal quadrant. The left lower abdomen was prepped and draped in the usual sterile fashion. 1% lidocaine was used for local anesthesia. Following this, a 6 Fr Safe-T-Centesis catheter was introduced. An ultrasound image was saved for documentation purposes. The paracentesis was performed. The catheter was removed and a dressing was applied. The patient tolerated the procedure well without immediate post procedural complication. FINDINGS: A total of approximately 4.9 L of yellow fluid was removed. IMPRESSION: Successful ultrasound-guided paracentesis yielding 4.9 liters of peritoneal fluid.  Electronically Signed   By: [name] M.D.   On: 11/28/2016 13:00       Management plans discussed with the patient, family and they are in agreement.  CODE STATUS:  Code Status History    Date Active Date Inactive Code Status Order ID Comments User Context   11/26/2016 10:30 AM 11/28/2016  3:22 PM Full Code [id number]  Henreitta Leber, MD Inpatient   11/15/2016  7:30 PM 11/16/2016  6:22 PM Full Code [id number]  Fritzi Mandes, MD Inpatient   10/29/2016  2:53 PM 10/30/2016  3:06 PM Full Code [id number]  Loletha Grayer, MD ED   09/15/2016  5:57 PM 09/16/2016  6:17 PM Full Code [id number]  Dustin Flock, MD ED   08/16/2016  9:45 AM 08/18/2016  6:16 PM Full Code [id number]  Hillary Bow, MD ED   08/09/2016  4:59 PM 08/12/2016  8:34 PM Full Code [id number]  Fritzi Mandes, MD Inpatient   07/03/2016  9:54 PM 07/05/2016  9:22 PM Full Code [id number]  Demetrios Loll, MD Inpatient   06/24/2016  4:23 AM 06/26/2016  3:46 PM Full Code [id number]  Saundra Shelling, MD Inpatient   06/14/2016  2:53 PM 06/19/2016  9:04 PM Full Code [id number]  Hillary Bow, MD ED   05/31/2016  6:42 AM 06/01/2016  9:30 PM Full Code [id number]  Saundra Shelling, MD Inpatient   05/05/2016 11:09 PM 05/10/2016  9:57 PM Full Code [id number]  Vianne Bulls, MD Inpatient   04/16/2016  3:56 AM 04/17/2016  6:27 PM Full Code [id number]  Saundra Shelling, MD Inpatient   01/19/2016  3:13 PM 01/22/2016  2:14 PM Full Code [id number]  Loletha Grayer, MD ED   09/24/2015 10:33 AM 09/25/2015  1:31 PM Full Code [id number]  Loletha Grayer, MD ED   05/16/2015  2:17 PM 05/17/2015  8:57 PM Full Code [id number]  Idelle Crouch, MD Inpatient   04/30/2015  8:08 PM 05/08/2015  7:40 PM Full Code [id number]  Vaughan Basta, MD Inpatient   04/06/2015  5:07 AM 04/07/2015  5:36 PM Full Code [id number]  Lance Coon, MD Inpatient   03/23/2015 11:07 AM 03/27/2015 12:46 PM Full Code [id number]  Bettey Costa, MD Inpatient   06/19/2013 11:32 AM 06/21/2013  5:49 PM Full Code [id number]  Cristal Ford, DO  Inpatient   06/16/2013  5:26 PM 06/19/2013 11:32 AM Full Code [id number]  Margarita Mail, PA-C ED   06/05/2013  3:33 PM 06/06/2013  6:56 PM Full Code [id number]  Theodoro Kos, DO Inpatient   06/02/2013  9:01 AM 06/05/2013  3:33 PM Full Code [id number]  Velvet Bathe, MD Inpatient   06/02/2013  8:40 AM 06/02/2013  9:01 AM Full Code [id number]  Velvet Bathe, MD Inpatient      TOTAL TIME TAKING CARE OF THIS PATIENT: 35 minutes.    Loletha Grayer M.D on 11/28/2016 at 4:05 PM  Between 7am to 6pm - Pager - [phone]  After 6pm go to [email or web address] - password [email or web address]  [facility] Office  [phone]  CC: Primary care physician; [name], MD

## 2016-11-28 NOTE — Procedures (Signed)
US guided paracentesis.  Removed 4.9 liters of yellow ascites from LLQ.  Minimal blood loss and no immediate complication.

## 2016-11-29 DIAGNOSIS — N186 End stage renal disease: Secondary | ICD-10-CM | POA: Diagnosis not present

## 2016-11-29 DIAGNOSIS — D649 Anemia, unspecified: Secondary | ICD-10-CM | POA: Diagnosis not present

## 2016-11-29 DIAGNOSIS — N2581 Secondary hyperparathyroidism of renal origin: Secondary | ICD-10-CM | POA: Diagnosis not present

## 2016-11-29 DIAGNOSIS — D509 Iron deficiency anemia, unspecified: Secondary | ICD-10-CM | POA: Diagnosis not present

## 2016-11-30 NOTE — Care Management (Signed)
11/24/2016- this RNCM received notification for ACDOSS stating that "the report was not accepted for evaluation; however, a social worker is being assigned to determine if there is a need for other services".

## 2016-12-01 DIAGNOSIS — N2581 Secondary hyperparathyroidism of renal origin: Secondary | ICD-10-CM | POA: Diagnosis not present

## 2016-12-01 DIAGNOSIS — D509 Iron deficiency anemia, unspecified: Secondary | ICD-10-CM | POA: Diagnosis not present

## 2016-12-01 DIAGNOSIS — D649 Anemia, unspecified: Secondary | ICD-10-CM | POA: Diagnosis not present

## 2016-12-01 DIAGNOSIS — N186 End stage renal disease: Secondary | ICD-10-CM | POA: Diagnosis not present

## 2016-12-06 DIAGNOSIS — N2581 Secondary hyperparathyroidism of renal origin: Secondary | ICD-10-CM | POA: Diagnosis not present

## 2016-12-06 DIAGNOSIS — D649 Anemia, unspecified: Secondary | ICD-10-CM | POA: Diagnosis not present

## 2016-12-06 DIAGNOSIS — D509 Iron deficiency anemia, unspecified: Secondary | ICD-10-CM | POA: Diagnosis not present

## 2016-12-06 DIAGNOSIS — N186 End stage renal disease: Secondary | ICD-10-CM | POA: Diagnosis not present

## 2016-12-07 ENCOUNTER — Ambulatory Visit: Admission: RE | Admit: 2016-12-07 | Payer: Medicare Other | Source: Ambulatory Visit

## 2016-12-10 DIAGNOSIS — D509 Iron deficiency anemia, unspecified: Secondary | ICD-10-CM | POA: Diagnosis not present

## 2016-12-10 DIAGNOSIS — D649 Anemia, unspecified: Secondary | ICD-10-CM | POA: Diagnosis not present

## 2016-12-10 DIAGNOSIS — N186 End stage renal disease: Secondary | ICD-10-CM | POA: Diagnosis not present

## 2016-12-10 DIAGNOSIS — N2581 Secondary hyperparathyroidism of renal origin: Secondary | ICD-10-CM | POA: Diagnosis not present

## 2016-12-11 DIAGNOSIS — Z992 Dependence on renal dialysis: Secondary | ICD-10-CM | POA: Diagnosis not present

## 2016-12-11 DIAGNOSIS — N186 End stage renal disease: Secondary | ICD-10-CM | POA: Diagnosis not present

## 2016-12-13 DIAGNOSIS — N2581 Secondary hyperparathyroidism of renal origin: Secondary | ICD-10-CM | POA: Diagnosis not present

## 2016-12-13 DIAGNOSIS — D509 Iron deficiency anemia, unspecified: Secondary | ICD-10-CM | POA: Diagnosis not present

## 2016-12-13 DIAGNOSIS — N186 End stage renal disease: Secondary | ICD-10-CM | POA: Diagnosis not present

## 2016-12-13 DIAGNOSIS — D649 Anemia, unspecified: Secondary | ICD-10-CM | POA: Diagnosis not present

## 2016-12-17 DIAGNOSIS — D649 Anemia, unspecified: Secondary | ICD-10-CM | POA: Diagnosis not present

## 2016-12-17 DIAGNOSIS — N186 End stage renal disease: Secondary | ICD-10-CM | POA: Diagnosis not present

## 2016-12-17 DIAGNOSIS — D509 Iron deficiency anemia, unspecified: Secondary | ICD-10-CM | POA: Diagnosis not present

## 2016-12-17 DIAGNOSIS — N2581 Secondary hyperparathyroidism of renal origin: Secondary | ICD-10-CM | POA: Diagnosis not present

## 2016-12-20 ENCOUNTER — Inpatient Hospital Stay
Admission: EM | Admit: 2016-12-20 | Discharge: 2016-12-21 | DRG: 640 | Discharge status: Against Medical Advice | Payer: Medicare Other | Attending: Internal Medicine | Admitting: Internal Medicine

## 2016-12-20 ENCOUNTER — Emergency Department: Payer: Medicare Other

## 2016-12-20 DIAGNOSIS — I132 Hypertensive heart and chronic kidney disease with heart failure and with stage 5 chronic kidney disease, or end stage renal disease: Secondary | ICD-10-CM | POA: Diagnosis present

## 2016-12-20 DIAGNOSIS — K7290 Hepatic failure, unspecified without coma: Secondary | ICD-10-CM | POA: Diagnosis present

## 2016-12-20 DIAGNOSIS — K219 Gastro-esophageal reflux disease without esophagitis: Secondary | ICD-10-CM | POA: Diagnosis present

## 2016-12-20 DIAGNOSIS — I259 Chronic ischemic heart disease, unspecified: Secondary | ICD-10-CM | POA: Diagnosis present

## 2016-12-20 DIAGNOSIS — Z9119 Patient's noncompliance with other medical treatment and regimen: Secondary | ICD-10-CM | POA: Diagnosis not present

## 2016-12-20 DIAGNOSIS — R0602 Shortness of breath: Secondary | ICD-10-CM | POA: Diagnosis not present

## 2016-12-20 DIAGNOSIS — R531 Weakness: Secondary | ICD-10-CM | POA: Diagnosis not present

## 2016-12-20 DIAGNOSIS — R609 Edema, unspecified: Secondary | ICD-10-CM | POA: Diagnosis not present

## 2016-12-20 DIAGNOSIS — N186 End stage renal disease: Secondary | ICD-10-CM | POA: Diagnosis present

## 2016-12-20 DIAGNOSIS — K922 Gastrointestinal hemorrhage, unspecified: Secondary | ICD-10-CM | POA: Diagnosis present

## 2016-12-20 DIAGNOSIS — R188 Other ascites: Secondary | ICD-10-CM | POA: Diagnosis present

## 2016-12-20 DIAGNOSIS — J449 Chronic obstructive pulmonary disease, unspecified: Secondary | ICD-10-CM | POA: Diagnosis present

## 2016-12-20 DIAGNOSIS — E1151 Type 2 diabetes mellitus with diabetic peripheral angiopathy without gangrene: Secondary | ICD-10-CM | POA: Diagnosis present

## 2016-12-20 DIAGNOSIS — Z79899 Other long term (current) drug therapy: Secondary | ICD-10-CM

## 2016-12-20 DIAGNOSIS — Z951 Presence of aortocoronary bypass graft: Secondary | ICD-10-CM

## 2016-12-20 DIAGNOSIS — Z7982 Long term (current) use of aspirin: Secondary | ICD-10-CM

## 2016-12-20 DIAGNOSIS — Z841 Family history of disorders of kidney and ureter: Secondary | ICD-10-CM | POA: Diagnosis not present

## 2016-12-20 DIAGNOSIS — Z992 Dependence on renal dialysis: Secondary | ICD-10-CM

## 2016-12-20 DIAGNOSIS — R1030 Lower abdominal pain, unspecified: Secondary | ICD-10-CM | POA: Diagnosis not present

## 2016-12-20 DIAGNOSIS — E785 Hyperlipidemia, unspecified: Secondary | ICD-10-CM | POA: Diagnosis present

## 2016-12-20 DIAGNOSIS — K7460 Unspecified cirrhosis of liver: Secondary | ICD-10-CM | POA: Diagnosis present

## 2016-12-20 DIAGNOSIS — D500 Iron deficiency anemia secondary to blood loss (chronic): Secondary | ICD-10-CM | POA: Diagnosis present

## 2016-12-20 DIAGNOSIS — I2510 Atherosclerotic heart disease of native coronary artery without angina pectoris: Secondary | ICD-10-CM | POA: Diagnosis present

## 2016-12-20 DIAGNOSIS — F17210 Nicotine dependence, cigarettes, uncomplicated: Secondary | ICD-10-CM | POA: Diagnosis present

## 2016-12-20 DIAGNOSIS — E1122 Type 2 diabetes mellitus with diabetic chronic kidney disease: Secondary | ICD-10-CM | POA: Diagnosis present

## 2016-12-20 DIAGNOSIS — F17200 Nicotine dependence, unspecified, uncomplicated: Secondary | ICD-10-CM | POA: Diagnosis not present

## 2016-12-20 DIAGNOSIS — D631 Anemia in chronic kidney disease: Secondary | ICD-10-CM | POA: Diagnosis present

## 2016-12-20 DIAGNOSIS — E1142 Type 2 diabetes mellitus with diabetic polyneuropathy: Secondary | ICD-10-CM | POA: Diagnosis present

## 2016-12-20 DIAGNOSIS — I5033 Acute on chronic diastolic (congestive) heart failure: Secondary | ICD-10-CM | POA: Diagnosis present

## 2016-12-20 DIAGNOSIS — E875 Hyperkalemia: Secondary | ICD-10-CM | POA: Diagnosis present

## 2016-12-20 DIAGNOSIS — R109 Unspecified abdominal pain: Secondary | ICD-10-CM | POA: Diagnosis not present

## 2016-12-20 DIAGNOSIS — N2581 Secondary hyperparathyroidism of renal origin: Secondary | ICD-10-CM | POA: Diagnosis present

## 2016-12-20 DIAGNOSIS — D649 Anemia, unspecified: Secondary | ICD-10-CM | POA: Diagnosis not present

## 2016-12-20 DIAGNOSIS — Z7952 Long term (current) use of systemic steroids: Secondary | ICD-10-CM

## 2016-12-20 DIAGNOSIS — Z7951 Long term (current) use of inhaled steroids: Secondary | ICD-10-CM

## 2016-12-20 HISTORY — DX: Heart failure, unspecified: I50.9

## 2016-12-20 LAB — BASIC METABOLIC PANEL
ANION GAP: 17 — AB (ref 5–15)
BUN: 65 mg/dL — ABNORMAL HIGH (ref 6–20)
CHLORIDE: 98 mmol/L — AB (ref 101–111)
CO2: 25 mmol/L (ref 22–32)
CREATININE: 9.82 mg/dL — AB (ref 0.61–1.24)
Calcium: 8.7 mg/dL — ABNORMAL LOW (ref 8.9–10.3)
GFR calc non Af Amer: 5 mL/min — ABNORMAL LOW (ref >60)
GFR, EST AFRICAN AMERICAN: 6 mL/min — AB (ref >60)
Glucose, Bld: 98 mg/dL (ref 65–99)
Potassium: 6.3 mmol/L (ref 3.5–5.1)
SODIUM: 140 mmol/L (ref 135–145)

## 2016-12-20 LAB — CBC
HCT: 25 % — ABNORMAL LOW (ref 40.0–52.0)
HEMOGLOBIN: 7.7 g/dL — AB (ref 13.0–18.0)
MCH: 23.9 pg — ABNORMAL LOW (ref 26.0–34.0)
MCHC: 30.7 g/dL — ABNORMAL LOW (ref 32.0–36.0)
MCV: 78.1 fL — AB (ref 80.0–100.0)
PLATELETS: 236 10*3/uL (ref 150–440)
RBC: 3.2 MIL/uL — AB (ref 4.40–5.90)
RDW: 23.6 % — ABNORMAL HIGH (ref 11.5–14.5)
WBC: 8.3 10*3/uL (ref 3.8–10.6)

## 2016-12-20 LAB — TROPONIN I: TROPONIN I: 0.11 ng/mL — AB (ref <0.03)

## 2016-12-20 LAB — PROTIME-INR
INR: 1.09
Prothrombin Time: 14 seconds (ref 11.4–15.2)

## 2016-12-20 LAB — GLUCOSE, CAPILLARY: Glucose-Capillary: 85 mg/dL (ref 65–99)

## 2016-12-20 MED ORDER — DIPHENHYDRAMINE HCL 50 MG/ML IJ SOLN
25.0000 mg | Freq: Once | INTRAMUSCULAR | Status: AC
  Administered 2016-12-20: 25 mg via INTRAVENOUS

## 2016-12-20 MED ORDER — GABAPENTIN 300 MG PO CAPS
300.0000 mg | ORAL_CAPSULE | Freq: Every day | ORAL | Status: DC
  Administered 2016-12-20: 300 mg via ORAL
  Filled 2016-12-20: qty 1

## 2016-12-20 MED ORDER — SODIUM CHLORIDE 0.9 % IV SOLN: 250.0000 mL | INTRAVENOUS | Status: DC | PRN

## 2016-12-20 MED ORDER — PENTAFLUOROPROP-TETRAFLUOROETH EX AERO: 1.0000 "application " | INHALATION_SPRAY | CUTANEOUS | Status: DC | PRN

## 2016-12-20 MED ORDER — SODIUM CHLORIDE 0.9 % IV SOLN
1.0000 g | Freq: Once | INTRAVENOUS | Status: AC
  Administered 2016-12-20: 1 g via INTRAVENOUS
  Filled 2016-12-20: qty 10

## 2016-12-20 MED ORDER — SODIUM CHLORIDE 0.9 % IV SOLN: 100.0000 mL | INTRAVENOUS | Status: DC | PRN

## 2016-12-20 MED ORDER — INSULIN ASPART 100 UNIT/ML ~~LOC~~ SOLN: 0.0000 [IU] | Freq: Every day | SUBCUTANEOUS | Status: DC

## 2016-12-20 MED ORDER — INSULIN ASPART 100 UNIT/ML ~~LOC~~ SOLN: 0.0000 [IU] | Freq: Three times a day (TID) | SUBCUTANEOUS | Status: DC

## 2016-12-20 MED ORDER — NEPHRO-VITE 0.8 MG PO TABS
1.0000 | ORAL_TABLET | Freq: Every day | ORAL | Status: DC
Start: 2016-12-20 — End: 2016-12-21
  Administered 2016-12-20: 1 via ORAL
  Filled 2016-12-20 (×4): qty 1

## 2016-12-20 MED ORDER — ALTEPLASE 2 MG IJ SOLR
2.0000 mg | Freq: Once | INTRAMUSCULAR | Status: DC | PRN
  Filled 2016-12-20: qty 2

## 2016-12-20 MED ORDER — ACETAMINOPHEN 650 MG RE SUPP: 650.0000 mg | Freq: Four times a day (QID) | RECTAL | Status: DC | PRN

## 2016-12-20 MED ORDER — MOMETASONE FURO-FORMOTEROL FUM 200-5 MCG/ACT IN AERO
2.0000 | INHALATION_SPRAY | Freq: Two times a day (BID) | RESPIRATORY_TRACT | Status: DC
  Administered 2016-12-20 – 2016-12-21 (×2): 2 via RESPIRATORY_TRACT
  Filled 2016-12-20: qty 8.8

## 2016-12-20 MED ORDER — GI COCKTAIL ~~LOC~~
30.0000 mL | Freq: Once | ORAL | Status: AC
  Administered 2016-12-20: 30 mL via ORAL
  Filled 2016-12-20: qty 30

## 2016-12-20 MED ORDER — DIPHENHYDRAMINE HCL 25 MG PO CAPS: 25.0000 mg | ORAL_CAPSULE | Freq: Four times a day (QID) | ORAL | Status: DC | PRN

## 2016-12-20 MED ORDER — FUROSEMIDE 40 MG PO TABS
80.0000 mg | ORAL_TABLET | Freq: Every day | ORAL | Status: DC
  Administered 2016-12-20: 80 mg via ORAL
  Filled 2016-12-20 (×2): qty 2

## 2016-12-20 MED ORDER — NITROGLYCERIN 0.4 MG SL SUBL: 0.4000 mg | SUBLINGUAL_TABLET | SUBLINGUAL | Status: DC | PRN

## 2016-12-20 MED ORDER — ONDANSETRON HCL 4 MG/2ML IJ SOLN: 4.0000 mg | Freq: Four times a day (QID) | INTRAMUSCULAR | Status: DC | PRN

## 2016-12-20 MED ORDER — LIDOCAINE-PRILOCAINE 2.5-2.5 % EX CREA: 1.0000 "application " | TOPICAL_CREAM | CUTANEOUS | Status: DC | PRN

## 2016-12-20 MED ORDER — SENNOSIDES-DOCUSATE SODIUM 8.6-50 MG PO TABS: 1.0000 | ORAL_TABLET | Freq: Every evening | ORAL | Status: DC | PRN

## 2016-12-20 MED ORDER — ACETAMINOPHEN 325 MG PO TABS: 650.0000 mg | ORAL_TABLET | Freq: Four times a day (QID) | ORAL | Status: DC | PRN

## 2016-12-20 MED ORDER — ALUM & MAG HYDROXIDE-SIMETH 200-200-20 MG/5ML PO SUSP: 30.0000 mL | Freq: Four times a day (QID) | ORAL | Status: DC | PRN

## 2016-12-20 MED ORDER — ALBUTEROL SULFATE (2.5 MG/3ML) 0.083% IN NEBU: 2.5000 mg | INHALATION_SOLUTION | RESPIRATORY_TRACT | Status: DC | PRN

## 2016-12-20 MED ORDER — ALBUMIN HUMAN 25 % IV SOLN
12.5000 g | Freq: Once | INTRAVENOUS | Status: AC
  Administered 2016-12-20: 12.5 g via INTRAVENOUS
  Filled 2016-12-20: qty 50

## 2016-12-20 MED ORDER — ATORVASTATIN CALCIUM 20 MG PO TABS
40.0000 mg | ORAL_TABLET | Freq: Every day | ORAL | Status: DC
  Administered 2016-12-20: 40 mg via ORAL
  Filled 2016-12-20 (×2): qty 2

## 2016-12-20 MED ORDER — LABETALOL HCL 100 MG PO TABS
100.0000 mg | ORAL_TABLET | Freq: Every day | ORAL | Status: DC
  Administered 2016-12-20: 100 mg via ORAL
  Filled 2016-12-20 (×2): qty 1

## 2016-12-20 MED ORDER — ONDANSETRON HCL 4 MG PO TABS
4.0000 mg | ORAL_TABLET | Freq: Four times a day (QID) | ORAL | Status: DC | PRN
  Administered 2016-12-20: 4 mg via ORAL
  Filled 2016-12-20: qty 1

## 2016-12-20 MED ORDER — TIOTROPIUM BROMIDE MONOHYDRATE 18 MCG IN CAPS
18.0000 ug | ORAL_CAPSULE | Freq: Every day | RESPIRATORY_TRACT | Status: DC
  Administered 2016-12-20: 18 ug via RESPIRATORY_TRACT
  Filled 2016-12-20: qty 5

## 2016-12-20 MED ORDER — HYDROCODONE-ACETAMINOPHEN 5-325 MG PO TABS
1.0000 | ORAL_TABLET | ORAL | Status: DC | PRN
  Administered 2016-12-20: 1 via ORAL
  Administered 2016-12-21 (×2): 2 via ORAL
  Filled 2016-12-20: qty 1
  Filled 2016-12-20 (×2): qty 2

## 2016-12-20 MED ORDER — LIDOCAINE HCL (PF) 1 % IJ SOLN
5.0000 mL | INTRAMUSCULAR | Status: DC | PRN
  Filled 2016-12-20: qty 5

## 2016-12-20 MED ORDER — SODIUM CHLORIDE 0.9% FLUSH: 3.0000 mL | INTRAVENOUS | Status: DC | PRN

## 2016-12-20 MED ORDER — BISACODYL 5 MG PO TBEC: 5.0000 mg | DELAYED_RELEASE_TABLET | Freq: Every day | ORAL | Status: DC | PRN

## 2016-12-20 MED ORDER — HEPARIN SODIUM (PORCINE) 1000 UNIT/ML DIALYSIS
1000.0000 [IU] | INTRAMUSCULAR | Status: DC | PRN
  Filled 2016-12-20: qty 1

## 2016-12-20 MED ORDER — PANTOPRAZOLE SODIUM 40 MG IV SOLR
40.0000 mg | Freq: Two times a day (BID) | INTRAVENOUS | Status: DC
  Administered 2016-12-20: 40 mg via INTRAVENOUS
  Filled 2016-12-20 (×2): qty 40

## 2016-12-20 MED ORDER — HEPARIN SODIUM (PORCINE) 5000 UNIT/ML IJ SOLN
5000.0000 [IU] | Freq: Three times a day (TID) | INTRAMUSCULAR | Status: DC
  Administered 2016-12-20 – 2016-12-21 (×2): 5000 [IU] via SUBCUTANEOUS
  Filled 2016-12-20 (×2): qty 1

## 2016-12-20 MED ORDER — SODIUM CHLORIDE 0.9% FLUSH
3.0000 mL | Freq: Two times a day (BID) | INTRAVENOUS | Status: DC
  Administered 2016-12-20: 3 mL via INTRAVENOUS

## 2016-12-20 MED ORDER — SEVELAMER CARBONATE 800 MG PO TABS
2400.0000 mg | ORAL_TABLET | Freq: Three times a day (TID) | ORAL | Status: DC
  Administered 2016-12-21: 2400 mg via ORAL
  Filled 2016-12-20 (×2): qty 3

## 2016-12-20 NOTE — ED Notes (Signed)
Lab to bedside at this time. 

## 2016-12-20 NOTE — Progress Notes (Signed)
Patient complaining of indigestion. Dr. Holley Raring notified. Order given for GI cocktail x1. Continue to monitor.

## 2016-12-20 NOTE — ED Triage Notes (Addendum)
Pt reports generalized weakness, abdominal swelling, bilateral leg swelling over the past week. Pt sent from dialysis center today, did not get his dialysis treatment. Increase in SOB over week. Pt alert and oriented X4, active, cooperative, pt in NAD. RR even and unlabored, color WNL.  Eating cookie at time of triage. Denies CP.   Abdomen very tight.

## 2016-12-20 NOTE — H&P (Addendum)
[facility] [place] [facility] [place] NAME: Stanley [name]    MR#:  [id number]  DATE OF BIRTH:  Jan 26, 1961  DATE OF ADMISSION:  12/20/2016  PRIMARY CARE PHYSICIAN: Alene Mires Elyse Jarvis, MD   REQUESTING/REFERRING PHYSICIAN: Schuyler Amor, MD  CHIEF COMPLAINT:   Chief Complaint  Patient presents with  . body swelling  . Weakness   Abdominal distention, swelling, leg swelling and generalized weakness. HISTORY OF PRESENT ILLNESS:  Stanley [name]  is a 56 y.o. male with a known history of ESRD on hemodialysis, CHF, liver cirrhosis, CAD, hypertension and diabetes. The patient presented to the ED with the above chief complaints. He has history of recurrent paracentesis and noncompliance. He missed the dialysis today due to above chief complaint. He complains of a shortness breath and worsening abdominal distention, but no fever, chills or abdominal pain. He was found highly distended abdomen with ascites and potassium 6.3. In addition, hemoglobin decreased to 7.7 from 9.9, FOTB is positive. Chest x-ray show COPD with superimposed mild CHF. No alveolar pneumonia. Previous CABG.  PAST MEDICAL HISTORY:   Past Medical History:  Diagnosis Date  . Alcohol abuse   . CHF (congestive heart failure) ([place])   . Cirrhosis ([place])   . Coronary artery disease 2009  . Diabetic peripheral neuropathy ([place])   . Drug abuse ([place])   . End stage renal disease on dialysis [facility]) NEPHROLOGIST-   DR [facility]  IN [place]   HEMODIALYSIS --   TUES/  THURS/  SAT  . GERD (gastroesophageal reflux disease)   . Hyperlipidemia   . Hypertension   . PAD (peripheral artery disease) ([place])   . Renal insufficiency    Per pt, 32 oz fluid restriction per day  . S/P triple vessel bypass 06/09/2016   2009ish  . Suicidal ideation    & HOMICIDAL IDEATION --  06-16-2013   ADMITTED TO BEHAVIOR HEALTH    PAST SURGICAL HISTORY:   Past Surgical History:  Procedure Laterality Date  . AGILE  CAPSULE N/A 06/19/2016   Procedure: AGILE CAPSULE;  Surgeon: [name], MD;  Location[facility] ENDOSCOPY;  Service: Endoscopy;  Laterality: N/A;  . COLONOSCOPY WITH PROPOFOL N/A 06/18/2016   Procedure: COLONOSCOPY WITH PROPOFOL;  Surgeon: [name], MD;  Location: [facility] ENDOSCOPY;  Service: Endoscopy;  Laterality: N/A;  . COLONOSCOPY WITH PROPOFOL N/A 08/12/2016   Procedure: COLONOSCOPY WITH PROPOFOL;  Surgeon: [name], MD;  Location: [facility] ENDOSCOPY;  Service: Endoscopy;  Laterality: N/A;  . CORONARY ANGIOPLASTY  ?   PT UNABLE TO TELL IF  BEFORE OR AFTER  CABG  . CORONARY ARTERY BYPASS GRAFT  2008  ([place] , [place])   3 VESSEL  . DIALYSIS FISTULA CREATION  LAST SURGERY  APPOX  2008  . ENTEROSCOPY N/A 05/10/2016   Procedure: ENTEROSCOPY;  Surgeon: Jerene Bears, MD;  Location: [place];  Service: Gastroenterology;  Laterality: N/A;  . ENTEROSCOPY N/A 08/12/2016   Procedure: ENTEROSCOPY;  Surgeon: [name], MD;  Location: [facility] ENDOSCOPY;  Service: Endoscopy;  Laterality: N/A;  . ESOPHAGOGASTRODUODENOSCOPY N/A 05/07/2015   Procedure: ESOPHAGOGASTRODUODENOSCOPY (EGD);  Surgeon: Hulen Luster, MD;  Location[facility] ENDOSCOPY;  Service: Endoscopy;  Laterality: N/A;  . ESOPHAGOGASTRODUODENOSCOPY (EGD) WITH PROPOFOL N/A 05/17/2015   Procedure: ESOPHAGOGASTRODUODENOSCOPY (EGD) WITH PROPOFOL;  Surgeon: [name], MD;  Location: [facility] ENDOSCOPY;  Service: Endoscopy;  Laterality: N/A;  . ESOPHAGOGASTRODUODENOSCOPY (EGD) WITH PROPOFOL N/A 01/20/2016   Procedure: ESOPHAGOGASTRODUODENOSCOPY (EGD) WITH PROPOFOL;  Surgeon: [name], MD;  Location: [facility] ENDOSCOPY;  Service: Endoscopy;  Laterality: N/A;  . ESOPHAGOGASTRODUODENOSCOPY (EGD) WITH PROPOFOL N/A 04/17/2016   Procedure: ESOPHAGOGASTRODUODENOSCOPY (EGD) WITH PROPOFOL;  Surgeon: Lin Landsman, MD;  Location: [facility] ENDOSCOPY;  Service: Gastroenterology;  Laterality: N/A;  . ESOPHAGOGASTRODUODENOSCOPY (EGD) WITH PROPOFOL  05/09/2016   Procedure:  ESOPHAGOGASTRODUODENOSCOPY (EGD) WITH PROPOFOL;  Surgeon: Jerene Bears, MD;  Location: [place];  Service: Endoscopy;;  . ESOPHAGOGASTRODUODENOSCOPY (EGD) WITH PROPOFOL N/A 06/16/2016   Procedure: ESOPHAGOGASTRODUODENOSCOPY (EGD) WITH PROPOFOL;  Surgeon: [name], MD;  Location: [facility] ENDOSCOPY;  Service: Endoscopy;  Laterality: N/A;  . GIVENS CAPSULE STUDY N/A 05/07/2016   Procedure: GIVENS CAPSULE STUDY;  Surgeon: Doran Stabler, MD;  Location: [place];  Service: Endoscopy;  Laterality: N/A;  . MANDIBULAR HARDWARE REMOVAL N/A 07/29/2013   Procedure: REMOVAL OF ARCH BARS;  Surgeon: Theodoro Kos, DO;  Location: [place];  Service: Plastics;  Laterality: N/A;  . ORIF MANDIBULAR FRACTURE N/A 06/05/2013   Procedure: REPAIR OF MANDIBULAR FRACTURE x 2 with maxillo-mandibular fixation ;  Surgeon: Theodoro Kos, DO;  Location: [place];  Service: Plastics;  Laterality: N/A;  . PERIPHERAL ARTERIAL STENT GRAFT Left     SOCIAL HISTORY:   Social History  Substance Use Topics  . Smoking status: Current Every Day Smoker    Packs/day: 0.15    Years: 40.00    Types: Cigarettes  . Smokeless tobacco: Never Used  . Alcohol use No     Comment: pt reports quitting after learning about cirrhosis    FAMILY HISTORY:   Family History  Problem Relation Age of Onset  . Colon cancer Mother   . Cancer Father   . Cancer Sister   . Kidney disease Brother     DRUG ALLERGIES:  No Known Allergies  REVIEW OF SYSTEMS:   Review of Systems  Constitutional: Positive for malaise/fatigue. Negative for chills and fever.       Weight gain  HENT: Negative for sore throat.   Eyes: Negative for blurred vision and double vision.  Respiratory: Positive for shortness of breath. Negative for cough, hemoptysis, wheezing and stridor.   Cardiovascular: Positive for leg swelling. Negative for chest pain, palpitations and orthopnea.  Gastrointestinal: Negative for abdominal pain, blood in stool,  diarrhea, melena, nausea and vomiting.       Abdominal distention.  Genitourinary: Negative for dysuria, flank pain and hematuria.  Musculoskeletal: Negative for back pain and joint pain.  Skin: Negative for rash.  Neurological: Negative for dizziness, sensory change, focal weakness, seizures, loss of consciousness, weakness and headaches.  Endo/Heme/Allergies: Negative for polydipsia.  Psychiatric/Behavioral: Negative for depression. The patient is not nervous/anxious.     MEDICATIONS AT HOME:   Prior to Admission medications   Medication Sig Start Date End Date Taking? Authorizing Provider  aspirin EC 81 MG tablet Take 1 tablet (81 mg total) by mouth daily. 11/16/16  Yes Vaughan Basta, MD  atorvastatin (LIPITOR) 40 MG tablet Take 1 tablet (40 mg total) by mouth daily. 11/16/16  Yes Vaughan Basta, MD  budesonide-formoterol [facility]) 160-4.5 MCG/ACT inhaler Inhale 2 puffs into the lungs daily. 11/16/16  Yes Vaughan Basta, MD  furosemide (LASIX) 80 MG tablet Take 1 tablet (80 mg total) by mouth daily. 11/16/16  Yes Vaughan Basta, MD  gabapentin (NEURONTIN) 300 MG capsule TAKE 1 CAPSULE BY MOUTH EVERY EVENING AND 1 EXTRA CAP ON DIALYSIS DAYS AS NEEDED FOR NERVE PAIN 08/16/16  Yes [provider]  labetalol (NORMODYNE) 100 MG tablet Take  1 tablet (100 mg total) by mouth daily. 11/16/16  Yes Vaughan Basta, MD  Multiple Vitamins-Minerals-FA (DIALYVITE SUPREME D) 3 MG TABS Take 1 tablet (3 mg total) by mouth daily. 11/28/16  Yes Wieting, Richard, MD  pantoprazole (PROTONIX) 40 MG tablet Take 1 tablet (40 mg total) by mouth daily. 11/28/16  Yes Wieting, Richard, MD  predniSONE (DELTASONE) 10 MG tablet 4 tabs daily for 4 days 11/28/16  Yes Wieting, Richard, MD  sevelamer carbonate (RENVELA) 800 MG tablet Take 3 tablets (2,400 mg total) by mouth 3 (three) times daily with meals. 11/16/16  Yes Vaughan Basta, MD  tiotropium (SPIRIVA HANDIHALER) 18 MCG  inhalation capsule Place 1 capsule (18 mcg total) into inhaler and inhale daily. 11/16/16 11/16/17 Yes Vaughan Basta, MD  albuterol (PROVENTIL HFA;VENTOLIN HFA) 108 (90 Base) MCG/ACT inhaler Inhale 4-6 puffs by mouth every 4 hours as needed for wheezing, cough, and/or shortness of breath 11/16/16   Vaughan Basta, MD  ipratropium-albuterol (DUONEB) 0.5-2.5 (3) MG/3ML SOLN Take 3 mLs by nebulization every 6 (six) hours as needed. 11/28/16   Loletha Grayer, MD  nitroGLYCERIN (NITROSTAT) 0.4 MG SL tablet Place 1 tablet (0.4 mg total) under the tongue every 5 (five) minutes as needed. 04/12/16   Wende Bushy, MD      VITAL SIGNS:  Blood pressure 135/87, pulse 86, temperature 98.8 F (37.1 C), temperature source Oral, resp. rate 19, height 6\' 3"  (1.905 m), weight 180 lb (81.6 kg), SpO2 96 %.  PHYSICAL EXAMINATION:  Physical Exam  GENERAL:  56 y.o.-year-old patient lying in the bed with no acute distress.  EYES: Pupils equal, round, reactive to light and accommodation. No scleral icterus. Extraocular muscles intact.  HEENT: Head atraumatic, normocephalic. Oropharynx and nasopharynx clear.  NECK:  Supple, no jugular venous distention. No thyroid enlargement, no tenderness.  LUNGS: Normal breath sounds bilaterally, no wheezing, rales,rhonchi or crepitation. No use of accessory muscles of respiration.  CARDIOVASCULAR: S1, S2 normal. No murmurs, rubs, or gallops.  ABDOMEN: Soft, Highly distended and positive ascites signs. Bowel sounds are weak, unable to estimate organomegaly or mass.  EXTREMITIES: Bilateral leg edema 2+. No cyanosis, or clubbing.  NEUROLOGIC: Cranial nerves II through XII are intact. Muscle strength 5/5 in all extremities. Sensation intact. Gait not checked.  PSYCHIATRIC: The patient is alert and oriented x 3.  SKIN: No obvious rash, lesion, or ulcer.   LABORATORY PANEL:   CBC  Recent Labs Lab 12/20/16 1235  WBC 8.3  HGB 7.7*  HCT 25.0*  PLT 236    ------------------------------------------------------------------------------------------------------------------  Chemistries   Recent Labs Lab 12/20/16 1235  NA 140  K 6.3*  CL 98*  CO2 25  GLUCOSE 98  BUN 65*  CREATININE 9.82*  CALCIUM 8.7*   ------------------------------------------------------------------------------------------------------------------  Cardiac Enzymes  Recent Labs Lab 12/20/16 1236  TROPONINI 0.11*   ------------------------------------------------------------------------------------------------------------------  RADIOLOGY:  Dg Chest 2 View  Result Date: 12/20/2016 CLINICAL DATA:  Shortness of breath ; history of coronary artery disease, diabetes, cirrhosis, COPD, current smoker. EXAM: CHEST  2 VIEW COMPARISON:  Chest x-ray of November 26, 2016 FINDINGS: The lungs are well-expanded. The interstitial markings are increased and more conspicuous than on the previous study. The cardiac silhouette remains enlarged. The central pulmonary vascularity is prominent. There are post CABG changes. There is calcification in the wall of the thoracic aorta. There is no pleural effusion. The bony thorax exhibits no acute abnormality. IMPRESSION: COPD with superimposed mild CHF. No alveolar pneumonia. Previous CABG. Thoracic aortic atherosclerosis. Electronically Signed  By: David  Martinique M.D.   On: 12/20/2016 13:04      IMPRESSION AND PLAN:   Hyperkalemia. The patient will be admitted to medical floor. Given calcium gluconate, follow-up Dr. Holley Raring for urgent hemodialysis.  Acute on chronic diastolic CHF due to fluid overload. The patient and need for urgent hemodialysis. Continue Lasix.  Large amount of ascites due to liver cirrhosis. Ultrasound guided paracentesis.  GI bleeding. Hold aspirin, start Protonix IV twice a day, follow-up GI consult. Nothing by mouth. CAD. Hold aspirin due to GI bleeding. Continue Lipitor. Anemia of chronic disease and  possible acute blood loss. Follow-up hemoglobin. PRBC transfusion when necessary. Diabetes. Start sliding scale. Tobacco abuse. Smoking cessation was counseled for 3-4 minutes. GERD. Maalox when necessary. Protonix IV twice a day.  All the records are reviewed and case discussed with ED provider. Management plans discussed with the patient, family and they are in agreement.  CODE STATUS: Full code.  TOTAL TIME TAKING CARE OF THIS PATIENT: 58 minutes.    Demetrios Loll M.D on 12/20/2016 at 2:24 PM  Between 7am to 6pm - Pager - [phone]  After 6pm go to [email or web address] - Proofreader  [facility]  Office  [phone]  CC: Primary care physician; [name], MD   Note: This dictation was prepared with Dragon dictation along with smaller phrase technology. Any transcriptional errors that result from this process are unin

## 2016-12-20 NOTE — ED Notes (Signed)
PIV access obtained but unable to draw blood specimens off line; lab notified to collect additional blood work.

## 2016-12-20 NOTE — Progress Notes (Signed)
Post hd assessment unchanged  

## 2016-12-20 NOTE — Progress Notes (Signed)
Pre dialysis  

## 2016-12-20 NOTE — ED Notes (Signed)
Date and time results received: 12/20/16 1315 (use smartphrase ".now" to insert current time)  Test: K+ Critical Value: 6.3  Name of Provider Notified: [place]  Orders Received? Or Actions Taken?: Acknowledged

## 2016-12-20 NOTE — ED Notes (Signed)
Hospitalist to bedside at this time 

## 2016-12-20 NOTE — Progress Notes (Signed)
Pre hd 

## 2016-12-20 NOTE — Progress Notes (Signed)
HD initiated without issue. Patient resting in recliner. Vitals stable currently. In no distress. States he wants something for heartburn.

## 2016-12-20 NOTE — ED Notes (Signed)
Attempt for blood draw X 1 by this RN unsuccessful.

## 2016-12-20 NOTE — ED Provider Notes (Addendum)
[facility] Emergency Department Provider Note  ____________________________________________   I have reviewed the triage vital signs and the nursing notes.   HISTORY  Chief Complaint body swelling and Weakness    HPI Stanley [name] is a 56 y.o. male  with a history of cirrhosis CHF alcohol abuse coronary artery disease, peripheral neuropathy, end-stage renal dialysis on Tuesday Thursday Saturday dialysis, and recurrent noncompliance with recurrent visits for hyperkalemia who presents today complaining of leg swelling and ascites. Patient was supposed to have his ascitestabs, however, he is unsure when that was supposed to happen, according to notes was supposed to happen on the 26th of last month but he no showed. Patient does have dialysis scheduled this morning but he elected to come here today because his legs are swelling. He denies any chest pain or shortness of breath over his baseline. He states he is always a little bit short of breath. He does have orthopnea which she states is chronic. Patient would like to have his ascites drained. He states that he has no significant abdominal pain, he said no fevers or vomiting. He does have chronic leg swelling which seems to be worse to him.     Past Medical History:  Diagnosis Date  . Alcohol abuse   . CHF (congestive heart failure) ([place])   . Cirrhosis ([place])   . Coronary artery disease 2009  . Diabetic peripheral neuropathy ([place])   . Drug abuse ([place])   . End stage renal disease on dialysis [facility]) NEPHROLOGIST-   DR [facility]  IN [place]   HEMODIALYSIS --   TUES/  THURS/  SAT  . GERD (gastroesophageal reflux disease)   . Hyperlipidemia   . Hypertension   . PAD (peripheral artery disease) ([place])   . Renal insufficiency    Per pt, 32 oz fluid restriction per day  . S/P triple vessel bypass 06/09/2016   2009ish  . Suicidal ideation    & HOMICIDAL IDEATION --  06-16-2013   ADMITTED TO BEHAVIOR HEALTH     Patient Active Problem List   Diagnosis Date Noted  . Shortness of breath 11/26/2016  . COPD (chronic obstructive pulmonary disease) ([place]) 10/30/2016  . COPD exacerbation ([place]) 10/29/2016  . Anemia   . Heme positive stool   . Ulceration of intestine   . Benign neoplasm of transverse colon   . Acute gastrointestinal hemorrhage   . Esophageal candidiasis ([place])   . Angiodysplasia of intestinal tract   . Acute respiratory failure with hypoxia ([place]) 07/03/2016  . GI bleeding 06/24/2016  . Rectal bleeding 06/14/2016  . Anemia of chronic disease 06/01/2016  . MRSA carrier 06/01/2016  . Chronic renal failure 05/23/2016  . Ischemic heart disease 05/23/2016  . Angiodysplasia of small intestine   . Melena   . Small bowel bleed not requiring more than 4 units of blood in 24 hours, ICU, or surgery   . Anemia due to chronic blood loss   . Abdominal pain 05/05/2016  . Acute posthemorrhagic anemia 04/17/2016  . Gastrointestinal bleed 04/17/2016  . History of esophagogastroduodenoscopy (EGD) 04/17/2016  . Elevated troponin 04/17/2016  . Alcohol abuse 04/17/2016  . Upper GI bleed 01/19/2016  . Blood in stool   . Angiodysplasia of stomach and duodenum with hemorrhage   . Gastritis   . Esophagitis, unspecified   . GI bleed 05/16/2015  . Acute GI bleeding   . Symptomatic anemia 04/30/2015  . HTN (hypertension) 04/06/2015  . GERD (gastroesophageal reflux disease) 04/06/2015  .  HLD (hyperlipidemia) 04/06/2015  . Dyspnea 04/06/2015  . Cirrhosis of liver with ascites ([place]) 04/06/2015  . Ascites 04/06/2015  . GIB (gastrointestinal bleeding) 03/23/2015  . Homicidal ideation 06/19/2013  . Suicidal intent 06/19/2013  . Homicidal ideations 06/19/2013  . Hyperkalemia 06/16/2013  . Mandible fracture ([place]) 06/05/2013  . Fracture, mandible ([place]) 06/02/2013  . Coronary atherosclerosis of native coronary artery 06/02/2013  . ESRD on dialysis ([place]) 06/02/2013  . Mandible open fracture ([place])  06/02/2013    Past Surgical History:  Procedure Laterality Date  . AGILE CAPSULE N/A 06/19/2016   Procedure: AGILE CAPSULE;  Surgeon: [name], MD;  Location[facility] ENDOSCOPY;  Service: Endoscopy;  Laterality: N/A;  . COLONOSCOPY WITH PROPOFOL N/A 06/18/2016   Procedure: COLONOSCOPY WITH PROPOFOL;  Surgeon: [name], MD;  Location: [facility] ENDOSCOPY;  Service: Endoscopy;  Laterality: N/A;  . COLONOSCOPY WITH PROPOFOL N/A 08/12/2016   Procedure: COLONOSCOPY WITH PROPOFOL;  Surgeon: [name], MD;  Location[facility] ENDOSCOPY;  Service: Endoscopy;  Laterality: N/A;  . CORONARY ANGIOPLASTY  ?   PT UNABLE TO TELL IF  BEFORE OR AFTER  CABG  . CORONARY ARTERY BYPASS GRAFT  2008  ([place] , )   3 VESSEL  . DIALYSIS FISTULA CREATION  LAST SURGERY  APPOX  2008  . ENTEROSCOPY N/A 05/10/2016   Procedure: ENTEROSCOPY;  Surgeon: Jerene Bears, MD;  Location: [place];  Service: Gastroenterology;  Laterality: N/A;  . ENTEROSCOPY N/A 08/12/2016   Procedure: ENTEROSCOPY;  Surgeon: [name], MD;  Location: [facility] ENDOSCOPY;  Service: Endoscopy;  Laterality: N/A;  . ESOPHAGOGASTRODUODENOSCOPY N/A 05/07/2015   Procedure: ESOPHAGOGASTRODUODENOSCOPY (EGD);  Surgeon: Hulen Luster, MD;  Location: [facility] ENDOSCOPY;  Service: Endoscopy;  Laterality: N/A;  . ESOPHAGOGASTRODUODENOSCOPY (EGD) WITH PROPOFOL N/A 05/17/2015   Procedure: ESOPHAGOGASTRODUODENOSCOPY (EGD) WITH PROPOFOL;  Surgeon: [name], MD;  Location: [facility] ENDOSCOPY;  Service: Endoscopy;  Laterality: N/A;  . ESOPHAGOGASTRODUODENOSCOPY (EGD) WITH PROPOFOL N/A 01/20/2016   Procedure: ESOPHAGOGASTRODUODENOSCOPY (EGD) WITH PROPOFOL;  Surgeon: [name], MD;  Location: [facility] ENDOSCOPY;  Service: Endoscopy;  Laterality: N/A;  . ESOPHAGOGASTRODUODENOSCOPY (EGD) WITH PROPOFOL N/A 04/17/2016   Procedure: ESOPHAGOGASTRODUODENOSCOPY (EGD) WITH PROPOFOL;  Surgeon: Lin Landsman, MD;  Location: [facility] ENDOSCOPY;  Service: Gastroenterology;  Laterality: N/A;  .  ESOPHAGOGASTRODUODENOSCOPY (EGD) WITH PROPOFOL  05/09/2016   Procedure: ESOPHAGOGASTRODUODENOSCOPY (EGD) WITH PROPOFOL;  Surgeon: Jerene Bears, MD;  Location: [place];  Service: Endoscopy;;  . ESOPHAGOGASTRODUODENOSCOPY (EGD) WITH PROPOFOL N/A 06/16/2016   Procedure: ESOPHAGOGASTRODUODENOSCOPY (EGD) WITH PROPOFOL;  Surgeon: [name], MD;  Location: [facility] ENDOSCOPY;  Service: Endoscopy;  Laterality: N/A;  . GIVENS CAPSULE STUDY N/A 05/07/2016   Procedure: GIVENS CAPSULE STUDY;  Surgeon: Doran Stabler, MD;  Location: [place];  Service: Endoscopy;  Laterality: N/A;  . MANDIBULAR HARDWARE REMOVAL N/A 07/29/2013   Procedure: REMOVAL OF ARCH BARS;  Surgeon: Theodoro Kos, DO;  Location: [place];  Service: Plastics;  Laterality: N/A;  . ORIF MANDIBULAR FRACTURE N/A 06/05/2013   Procedure: REPAIR OF MANDIBULAR FRACTURE x 2 with maxillo-mandibular fixation ;  Surgeon: Theodoro Kos, DO;  Location: [place];  Service: Plastics;  Laterality: N/A;  . PERIPHERAL ARTERIAL STENT GRAFT Left     Prior to Admission medications   Medication Sig Start Date End Date Taking? Authorizing Provider  aspirin EC 81 MG tablet Take 1 tablet (81 mg total) by mouth daily. 11/16/16  Yes Vaughan Basta, MD  atorvastatin (LIPITOR) 40 MG tablet Take 1 tablet (40 mg total)  by mouth daily. 11/16/16  Yes Vaughan Basta, MD  budesonide-formoterol [facility]) 160-4.5 MCG/ACT inhaler Inhale 2 puffs into the lungs daily. 11/16/16  Yes Vaughan Basta, MD  furosemide (LASIX) 80 MG tablet Take 1 tablet (80 mg total) by mouth daily. 11/16/16  Yes Vaughan Basta, MD  gabapentin (NEURONTIN) 300 MG capsule TAKE 1 CAPSULE BY MOUTH EVERY EVENING AND 1 EXTRA CAP ON DIALYSIS DAYS AS NEEDED FOR NERVE PAIN 08/16/16  Yes [provider]  labetalol (NORMODYNE) 100 MG tablet Take 1 tablet (100 mg total) by mouth daily. 11/16/16  Yes Vaughan Basta, MD  Multiple Vitamins-Minerals-FA  (DIALYVITE SUPREME D) 3 MG TABS Take 1 tablet (3 mg total) by mouth daily. 11/28/16  Yes Wieting, Richard, MD  pantoprazole (PROTONIX) 40 MG tablet Take 1 tablet (40 mg total) by mouth daily. 11/28/16  Yes Wieting, Richard, MD  predniSONE (DELTASONE) 10 MG tablet 4 tabs daily for 4 days 11/28/16  Yes Wieting, Richard, MD  sevelamer carbonate (RENVELA) 800 MG tablet Take 3 tablets (2,400 mg total) by mouth 3 (three) times daily with meals. 11/16/16  Yes Vaughan Basta, MD  tiotropium (SPIRIVA HANDIHALER) 18 MCG inhalation capsule Place 1 capsule (18 mcg total) into inhaler and inhale daily. 11/16/16 11/16/17 Yes Vaughan Basta, MD  albuterol (PROVENTIL HFA;VENTOLIN HFA) 108 (90 Base) MCG/ACT inhaler Inhale 4-6 puffs by mouth every 4 hours as needed for wheezing, cough, and/or shortness of breath 11/16/16   Vaughan Basta, MD  ipratropium-albuterol (DUONEB) 0.5-2.5 (3) MG/3ML SOLN Take 3 mLs by nebulization every 6 (six) hours as needed. 11/28/16   Loletha Grayer, MD  nitroGLYCERIN (NITROSTAT) 0.4 MG SL tablet Place 1 tablet (0.4 mg total) under the tongue every 5 (five) minutes as needed. 04/12/16   Wende Bushy, MD    Allergies Patient has no known allergies.  Family History  Problem Relation Age of Onset  . Colon cancer Mother   . Cancer Father   . Cancer Sister   . Kidney disease Brother     Social History Social History  Substance Use Topics  . Smoking status: Current Every Day Smoker    Packs/day: 0.15    Years: 40.00    Types: Cigarettes  . Smokeless tobacco: Never Used  . Alcohol use No     Comment: pt reports quitting after learning about cirrhosis    Review of Systems Constitutional: No fever/chills Eyes: No visual changes. ENT: No sore throat. No stiff neck no neck pain Cardiovascular: Denies chest pain. Respiratory: see history of present illness Gastrointestinal:   no vomiting.  No diarrhea.  No constipation. Genitourinary: Negative for  dysuria. Musculoskeletal: Negative lower extremity swelling Skin: Negative for rash. Neurological: Negative for severe headaches, focal weakness or numbness.   ____________________________________________   PHYSICAL EXAM:  VITAL SIGNS: ED Triage Vitals [12/20/16 1210]  Enc Vitals Group     BP 126/70     Pulse Rate 60     Resp 18     Temp 98.8 F (37.1 C)     Temp Source Oral     SpO2 99 %     Weight 180 lb (81.6 kg)     Height 6\' 3"  (1.905 m)     Head Circumference      Peak Flow      Pain Score 8     Pain Loc      Pain Edu?      Excl. in [place]?     Constitutional: Alert and oriented. H and in no acute  distress Eyes: Conjunctivae are normal Head: Atraumatic HEENT: No congestion/rhinnorhea. Mucous membranes are moist.  Oropharynx non-erythematous Neck:   Nontender with no meningismus, no masses, no stridor Cardiovascular: Normal rate, regular rhythm. Grossly normal heart sounds.  Good peripheral circulation. Respiratory: Normal respiratory effort.  No retractions. Lungs medicine the basis. Abdominal: tense nontender ascites noted with no guarding or rebound Back:  There is no focal tenderness or step off.  there is no midline tenderness there are no lesions noted. there is no CVA tenderness Musculoskeletal: No lower extremity tenderness, no upper extremity tenderness. No joint effusions, no DVT signs strong distal pulses chronicappearing bilateral pitting symmetricedema Neurologic:  Normal speech and language. No gross focal neurologic deficits are appreciated.  Skin:  Skin is warm, dry and intact. No rash noted. Psychiatric: Mood and affect are normal. Speech and behavior are normal.  ____________________________________________   LABS (all labs ordered are listed, but only abnormal results are displayed)  Labs Reviewed  BASIC METABOLIC PANEL - Abnormal; Notable for the following:       Result Value   Potassium 6.3 (*)    Chloride 98 (*)    BUN 65 (*)     Creatinine, Ser 9.82 (*)    Calcium 8.7 (*)    GFR calc non Af Amer 5 (*)    GFR calc Af Amer 6 (*)    Anion gap 17 (*)    All other components within normal limits  CBC - Abnormal; Notable for the following:    RBC 3.20 (*)    Hemoglobin 7.7 (*)    HCT 25.0 (*)    MCV 78.1 (*)    MCH 23.9 (*)    MCHC 30.7 (*)    RDW 23.6 (*)    All other components within normal limits  TROPONIN I - Abnormal; Notable for the following:    Troponin I 0.11 (*)    All other components within normal limits  URINALYSIS, COMPLETE (UACMP) WITH MICROSCOPIC    Pertinent labs  results that were available during my care of the patient were reviewed by me and considered in my medical decision making (see chart for details). ____________________________________________  EKG  I personally interpreted any EKGs ordered by me or triage sinus rhythm rate 87 bpm, no acute ST elevation or depression on severity ST waves changes noted, no peaked T waves, QTC is bore line large at 471  ____________________________________________  RADIOLOGY  Pertinent labs & imaging results that were available during my care of the patient were reviewed by me and considered in my medical decision making (see chart for details). If possible, patient and/or family made aware of any abnormal findings. ____________________________________________    PROCEDURES  Procedure(s) performed: None  Procedures  Critical Care performed: CRITICAL CARE Performed by: Schuyler Amor   Total critical care time: 46 minutes  Critical care time was exclusive of separately billable procedures and treating other patients.  Critical care was necessary to treat or prevent imminent or life-threatening deterioration.  Critical care was time spent personally by me on the following activities: development of treatment plan with patient and/or surrogate as well as nursing, discussions with consultants, evaluation of patient's response to  treatment, examination of patient, obtaining history from patient or surrogate, ordering and performing treatments and interventions, ordering and review of laboratory studies, ordering and review of radiographic studies, pulse oximetry and re-evaluation of patient's condition.   ____________________________________________   INITIAL IMPRESSION / ASSESSMENT AND PLAN / ED COURSE  Pertinent labs &  imaging results that were available during my care of the patient were reviewed by me and considered in my medical decision making (see chart for details).  patients here with mostly chronic complaints however he doesn't, again, have elevated potassium of 6.3, no EKG changes, I discussed with Dr. Holley Raring of neurology, he states that he does not wish me to be aggressive in lowering the potassium he states give calcium if I would like to but do not otherwise intervene because they will simply do dialysis on him and this is a chronic situation and with no EKG changes there is no indication foremergent ER intervention. This does not seem unreasonable we will continue to watch him. In addition, patient has a tense ascites. He is supposed to have that tapped aboutover a week ago but he failed to go to the appointment. He would like to have that tapped. Nothing to suggest at this time S BP. Finally, patient is significantly anemic,he denies GI bleed but I'll obtain guaiac sample to verify. Certainly could be that he is chronically anemic and getting gradually worse but blood work suggests this is more acute than that. For all these reasons, patient will be admitted to the hospital.     told about his hemoglobin, patient states it is having dark stool, he is guaiac positive with dark stool from below. Type and screen andcoags have been ordered____________________________________________   FINAL CLINICAL IMPRESSION(S) / ED DIAGNOSES  Final diagnoses:  Hyperkalemia      This chart was dictated using voice  recognition software.  Despite best efforts to proofread,  errors can occur which can change meaning.      Schuyler Amor, MD 12/20/16 1355    Schuyler Amor, MD 12/20/16 [phone]

## 2016-12-20 NOTE — Progress Notes (Signed)
HD completed. 3L UF without issue.

## 2016-12-21 ENCOUNTER — Inpatient Hospital Stay: Payer: Medicare Other

## 2016-12-21 LAB — TYPE AND SCREEN
ABO/RH(D): A POS
Antibody Screen: NEGATIVE
UNIT DIVISION: 0

## 2016-12-21 LAB — BASIC METABOLIC PANEL
Anion gap: 11 (ref 5–15)
BUN: 41 mg/dL — AB (ref 6–20)
CO2: 28 mmol/L (ref 22–32)
CREATININE: 6.75 mg/dL — AB (ref 0.61–1.24)
Calcium: 8.3 mg/dL — ABNORMAL LOW (ref 8.9–10.3)
Chloride: 100 mmol/L — ABNORMAL LOW (ref 101–111)
GFR, EST AFRICAN AMERICAN: 9 mL/min — AB (ref >60)
GFR, EST NON AFRICAN AMERICAN: 8 mL/min — AB (ref >60)
Glucose, Bld: 110 mg/dL — ABNORMAL HIGH (ref 65–99)
POTASSIUM: 5.2 mmol/L — AB (ref 3.5–5.1)
SODIUM: 139 mmol/L (ref 135–145)

## 2016-12-21 LAB — BPAM RBC
Blood Product Expiration Date: 201810172359
Unit Type and Rh: 600

## 2016-12-21 LAB — PREPARE RBC (CROSSMATCH)

## 2016-12-21 LAB — HEMOGLOBIN: Hemoglobin: 6.6 g/dL — ABNORMAL LOW (ref 13.0–18.0)

## 2016-12-21 LAB — GLUCOSE, CAPILLARY: Glucose-Capillary: 109 mg/dL — ABNORMAL HIGH (ref 65–99)

## 2016-12-21 MED ORDER — SODIUM CHLORIDE 0.9 % IV SOLN: Freq: Once | INTRAVENOUS | Status: DC

## 2016-12-21 NOTE — Discharge Summary (Signed)
[place] [facility] [place], [place] y.o., DOB [birth year], MRN [id number]. Admission date: 12/20/2016 Discharge Date 12/21/2016 Primary MD Revelo, Elyse Jarvis, MD Admitting Physician Demetrios Loll, MD  [place] summary    Admission Diagnosis  Hyperkalemia [E87.5]  Discharge Diagnosis   Active Problems:  Hyperkalemia  Acute on chronic diastolic CHF   ascites  GI bleeding with anemia Coronary artery disease        Hospital Course Patient is a 56 year old with history of multiple medical problems with recurrent admissions to the hospital who presented yesterday with weakness and was noted to have anemia hyperkalemia. Patient had urgent dialysis. He also has noted to have ascites which was drained earlier today. Patient was scheduled to get transfused due to drop in his hemoglobin. After coming back from draining his ascites he left the hospital [place]. Patient explained that he must get transfusion however he did not want to wait for that and left. He was explained that this could be deadly.              Total Time in preparing paper work, data evaluation and todays exam - 35 minutes  Dustin Flock M.D on 12/21/2016 at 1:53 PM  [facility]   Office  [phone]

## 2016-12-21 NOTE — Progress Notes (Addendum)
Pt returned from US guided paracentesis. Verbally aggressive and upset. Wants to leave ama. Refuses to sign AMA form. Also has orders for blood transfusion but refuses to sign the refusal form. Pt is currently waiting for his sister to pick him up in the room but now wants to go downstairs and wait. Refuses to let staff escort him. MD notified.

## 2016-12-21 NOTE — Procedures (Signed)
US guided paracentesis.  Removed 4.95 liters of yellow fluid.  No blood loss and no immediate complication.

## 2016-12-21 NOTE — Progress Notes (Signed)
[facility]  ROUNDING NOTE   Subjective:  Patient very well known to [name]. He presents now with recurrent ascites, hyperkalemia, and recurrent GI bleed. Next line he did undergo dialysis yesterday.  potassium down to 5.2.   Objective:  Vital signs in last 24 hours:  Temp:  [98 F (36.7 C)-98.9 F (37.2 C)] 98.5 F (36.9 C) (10/10 0455) Pulse Rate:  [60-96] 70 (10/10 1130) Resp:  [12-20] 18 (10/10 1130) BP: (108-158)/(64-107) 108/68 (10/10 1116) SpO2:  [94 %-100 %] 97 % (10/10 1130) Weight:  [81.6 kg (179 lb 14.3 oz)-88.9 kg (195 lb 14.4 oz)] 88.9 kg (195 lb 14.4 oz) (10/09 2015)  Weight change:  Filed Weights   12/20/16 1210 12/20/16 1539 12/20/16 2015  Weight: 81.6 kg (180 lb) 81.6 kg (179 lb 14.3 oz) 88.9 kg (195 lb 14.4 oz)    Intake/Output: I/O last 3 completed shifts: In: 236 [P.O.:236] Out: 3050 [Urine:50; Other:3000]   Intake/Output this shift:  No intake/output data recorded.  Physical Exam: General: No acute distress  Head: Normocephalic, atraumatic. Moist oral mucosal membranes  Eyes: Anicteric  Neck: Supple, trachea midline  Lungs:  Clear to auscultation, normal effort  Heart: S1S2 no rubs  Abdomen:  Mild diffuse tenderness, distension noted  Extremities: trace peripheral edema.  Neurologic: Awake, alert, following commands  Skin: No lesions  Access: RUE AVF    Basic Metabolic Panel:  Recent Labs Lab 12/20/16 1235 12/21/16 0612  NA 140 139  K 6.3* 5.2*  CL 98* 100*  CO2 25 28  GLUCOSE 98 110*  BUN 65* 41*  CREATININE 9.82* 6.75*  CALCIUM 8.7* 8.3*    Liver Function Tests: No results for input(s): AST, ALT, ALKPHOS, BILITOT, PROT, ALBUMIN in the last 168 hours. No results for input(s): LIPASE, AMYLASE in the last 168 hours. No results for input(s): AMMONIA in the last 168 hours.  CBC:  Recent Labs Lab 12/20/16 1235 12/21/16 0612  WBC 8.3  --   HGB 7.7* 6.6*  HCT 25.0*  --   MCV 78.1*  --   PLT 236  --     Cardiac  Enzymes:  Recent Labs Lab 12/20/16 1236  TROPONINI 0.11*    BNP: Invalid input(s): POCBNP  CBG:  Recent Labs Lab 12/20/16 2042 12/21/16 0722  GLUCAP 85 109*    Microbiology: Results for orders placed or performed during the hospital encounter of 11/26/16  MRSA PCR Screening     Status: None   Collection Time: 11/26/16  3:30 PM  Result Value Ref Range Status   MRSA by PCR NEGATIVE NEGATIVE Final    Comment:        The GeneXpert MRSA Assay (FDA approved for NASAL specimens only), is one component of a comprehensive MRSA colonization surveillance program. It is not intended to diagnose MRSA infection nor to guide or monitor treatment for MRSA infections.     Coagulation Studies:  Recent Labs  12/20/16 1440  LABPROT 14.0  INR 1.09    Urinalysis: No results for input(s): COLORURINE, LABSPEC, PHURINE, GLUCOSEU, HGBUR, BILIRUBINUR, KETONESUR, PROTEINUR, UROBILINOGEN, NITRITE, LEUKOCYTESUR in the last 72 hours.  Invalid input(s): APPERANCEUR    Imaging: Dg Chest 2 View  Result Date: 12/20/2016 CLINICAL DATA:  Shortness of breath ; history of coronary artery disease, diabetes, cirrhosis, COPD, current smoker. EXAM: CHEST  2 VIEW COMPARISON:  Chest x-ray of November 26, 2016 FINDINGS: The lungs are well-expanded. The interstitial markings are increased and more conspicuous than on the previous study. The cardiac  silhouette remains enlarged. The central pulmonary vascularity is prominent. There are post CABG changes. There is calcification in the wall of the thoracic aorta. There is no pleural effusion. The bony thorax exhibits no acute abnormality. IMPRESSION: COPD with superimposed mild CHF. No alveolar pneumonia. Previous CABG. Thoracic aortic atherosclerosis. Electronically Signed   By: David  Martinique M.D.   On: 12/20/2016 13:04     Medications:   . sodium chloride    . sodium chloride    . sodium chloride    . sodium chloride    . sodium chloride     .  atorvastatin  40 mg Oral Daily  . b complex-vitamin c-folic acid  1 tablet Oral Daily  . furosemide  80 mg Oral Daily  . gabapentin  300 mg Oral QHS  . heparin  5,000 Units Subcutaneous Q8H  . insulin aspart  0-5 Units Subcutaneous QHS  . insulin aspart  0-9 Units Subcutaneous TID WC  . labetalol  100 mg Oral Daily  . mometasone-formoterol  2 puff Inhalation BID  . pantoprazole (PROTONIX) IV  40 mg Intravenous Q12H  . sevelamer carbonate  2,400 mg Oral TID WC  . sodium chloride flush  3 mL Intravenous Q12H  . tiotropium  18 mcg Inhalation Daily   sodium chloride, sodium chloride, sodium chloride, acetaminophen **OR** acetaminophen, albuterol, alteplase, alum & mag hydroxide-simeth, bisacodyl, diphenhydrAMINE, heparin, HYDROcodone-acetaminophen, lidocaine (PF), lidocaine-prilocaine, nitroGLYCERIN, ondansetron **OR** ondansetron (ZOFRAN) IV, pentafluoroprop-tetrafluoroeth, senna-docusate, sodium chloride flush  Assessment/ Plan:  56 y.o. 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 End-stage renal disease. 2. Hyperkalemia 3. Hypertension 4. Anemia of chronic kidney disease, anemia secondary to GI bleed as well. 5. Ascites secondary to end-stage liver disease and cirrhosis. 6. Secondary hyperparathyroidism.  Plan:  Patient completed hemodialysis yesterday.  Potassium is down to 5.2 today. Therefore no urgent indication for dialysis today. We will plan for dialysis again tomorrow. Hemoglobin noted to be quite low today. Agree with PRBC transfusion 1. Also agree with gastric urology consultation as the patient does have history of gastrointestinal AVMs. In addition he will likely be going for paracentesis today for recurrent  ascites. Further plan as patient presses.  LOS: 1 Ilea Hilton 10/10/201811:43 AM

## 2016-12-21 NOTE — Progress Notes (Signed)
[name] at [facility]                                                                                                                                                                                  Patient Demographics   Stanley [name], is a 56 y.o. male, DOB - 1960-10-05, HGD:[id number]  Admit date - 12/20/2016   Admitting Physician Demetrios Loll, MD  Outpatient Primary MD for the patient is Revelo, Elyse Jarvis, MD   LOS - 1  Subjective: Patient with multiple admissions in the past, who usually leaves AMA after paracentesis Patient had hemodialysis yesterday His hemoglobin has dropped since yesterday Currently he is asymptomatic    Review of Systems:   CONSTITUTIONAL: No documented fever. No fatigue, weakness. No weight gain, no weight loss.  EYES: No blurry or double vision.  ENT: No tinnitus. No postnasal drip. No redness of the oropharynx.  RESPIRATORY: No cough, no wheeze, no hemoptysis. No dyspnea.  CARDIOVASCULAR: No chest pain. No orthopnea. No palpitations. No syncope.  GASTROINTESTINAL: No nausea, no vomiting or diarrhea. No abdominal pain. No melena or hematochezia.  GENITOURINARY: No dysuria or hematuria.  ENDOCRINE: No polyuria or nocturia. No heat or cold intolerance.  HEMATOLOGY: No anemia. No bruising. No bleeding.  INTEGUMENTARY: No rashes. No lesions.  MUSCULOSKELETAL: No arthritis. No swelling. No gout.  NEUROLOGIC: No numbness, tingling, or ataxia. No seizure-type activity.  PSYCHIATRIC: No anxiety. No insomnia. No ADD.    Vitals:   Vitals:   12/21/16 0455 12/21/16 1050 12/21/16 1116 12/21/16 1130  BP: 113/64 113/69 108/68   Pulse: 78 73 73 70  Resp: 20 18  18   Temp: 98.5 F (36.9 C)     TempSrc: Oral     SpO2: 97% 96% 96% 97%  Weight:      Height:        Wt Readings from Last 3 Encounters:  12/20/16 195 lb 14.4 oz (88.9 kg)  11/26/16 180 lb (81.6 kg)  11/15/16 196 lb 4.8 oz (89 kg)     Intake/Output Summary  (Last 24 hours) at 12/21/16 1349 Last data filed at 12/21/16 0303  Gross per 24 hour  Intake              236 ml  Output             3050 ml  Net            -2814 ml    Physical Exam:   GENERAL: Pleasant-appearing in no apparent distress.  HEAD, EYES, EARS, NOSE AND THROAT: Atraumatic, normocephalic. Extraocular muscles are intact. Pupils equal and reactive to light. Sclerae anicteric. No conjunctival injection. No  oro-pharyngeal erythema.  NECK: Supple. There is no jugular venous distention. No bruits, no lymphadenopathy, no thyromegaly.  HEART: Regular rate and rhythm,. No murmurs, no rubs, no clicks.  LUNGS: Clear to auscultation bilaterally. No rales or rhonchi. No wheezes.  ABDOMEN: Soft, flat, nontender and distended abdomen Has good bowel sounds. No hepatosplenomegaly appreciated.  EXTREMITIES: No evidence of any cyanosis, clubbing, or peripheral edema.  +2 pedal and radial pulses bilaterally.  NEUROLOGIC: The patient is alert, awake, and oriented x3 with no focal motor or sensory deficits appreciated bilaterally.  SKIN: Moist and warm with no rashes appreciated.  Psych: Not anxious, depressed LN: No inguinal LN enlargement    Antibiotics   Anti-infectives    None      Medications   Scheduled Meds: . atorvastatin  40 mg Oral Daily  . b complex-vitamin c-folic acid  1 tablet Oral Daily  . furosemide  80 mg Oral Daily  . gabapentin  300 mg Oral QHS  . heparin  5,000 Units Subcutaneous Q8H  . insulin aspart  0-5 Units Subcutaneous QHS  . insulin aspart  0-9 Units Subcutaneous TID WC  . labetalol  100 mg Oral Daily  . mometasone-formoterol  2 puff Inhalation BID  . pantoprazole (PROTONIX) IV  40 mg Intravenous Q12H  . sevelamer carbonate  2,400 mg Oral TID WC  . sodium chloride flush  3 mL Intravenous Q12H  . tiotropium  18 mcg Inhalation Daily   Continuous Infusions: . sodium chloride    . sodium chloride    . sodium chloride    . sodium chloride    . sodium  chloride     PRN Meds:.sodium chloride, sodium chloride, sodium chloride, acetaminophen **OR** acetaminophen, albuterol, alteplase, alum & mag hydroxide-simeth, bisacodyl, diphenhydrAMINE, heparin, HYDROcodone-acetaminophen, lidocaine (PF), lidocaine-prilocaine, nitroGLYCERIN, ondansetron **OR** ondansetron (ZOFRAN) IV, pentafluoroprop-tetrafluoroeth, senna-docusate, sodium chloride flush   Data Review:   Micro Results No results found for this or any previous visit (from the past 240 hour(s)).  Radiology Reports Dg Chest 1 View  Result Date: 11/26/2016 CLINICAL DATA:  Shortness of breath. EXAM: CHEST 1 VIEW COMPARISON:  November 15, 2016. FINDINGS: Unchanged cardiomegaly. Prior CABG. Mild pulmonary vascular congestion. Atherosclerotic calcification of the aortic arch. Bibasilar atelectasis. No focal consolidation, pleural effusion, or pneumothorax. No acute osseous abnormality. IMPRESSION: Cardiomegaly with mild pulmonary vascular congestion, unchanged. Electronically Signed   By: Titus Dubin M.D.   On: 11/26/2016 07:22   Dg Chest 2 View  Result Date: 12/20/2016 CLINICAL DATA:  Shortness of breath ; history of coronary artery disease, diabetes, cirrhosis, COPD, current smoker. EXAM: CHEST  2 VIEW COMPARISON:  Chest x-ray of November 26, 2016 FINDINGS: The lungs are well-expanded. The interstitial markings are increased and more conspicuous than on the previous study. The cardiac silhouette remains enlarged. The central pulmonary vascularity is prominent. There are post CABG changes. There is calcification in the wall of the thoracic aorta. There is no pleural effusion. The bony thorax exhibits no acute abnormality. IMPRESSION: COPD with superimposed mild CHF. No alveolar pneumonia. Previous CABG. Thoracic aortic atherosclerosis. Electronically Signed   By: David  Martinique M.D.   On: 12/20/2016 13:04   US Paracentesis  Result Date: 12/21/2016 INDICATION: Recurrent ascites. EXAM: ULTRASOUND  GUIDED PARACENTESIS MEDICATIONS: None. COMPLICATIONS: None immediate. PROCEDURE: Informed written consent was obtained from the patient after a discussion of the risks, benefits and alternatives to treatment. A timeout was performed prior to the initiation of the procedure. Initial ultrasound scanning demonstrates a large amount of  ascites within the left lower abdominal quadrant. The left lower abdomen was prepped and draped in the usual sterile fashion. 1% lidocaine was used for local anesthesia. Following this, a 6 Fr Safe-T-Centesis catheter was introduced. An ultrasound image was saved for documentation purposes. The paracentesis was performed. The catheter was removed and a dressing was applied. The patient tolerated the procedure well without immediate post procedural complication. FINDINGS: A total of approximately 4.95 L of yellow fluid was removed. IMPRESSION: Successful ultrasound-guided paracentesis yielding 4.95 liters of peritoneal fluid. Electronically Signed   By: [name] M.D.   On: 12/21/2016 12:24   US Paracentesis  Result Date: 11/28/2016 INDICATION: Recurrent ascites. EXAM: ULTRASOUND GUIDED PARACENTESIS MEDICATIONS: None. COMPLICATIONS: None immediate. PROCEDURE: Informed written consent was obtained from the patient after a discussion of the risks, benefits and alternatives to treatment. A timeout was performed prior to the initiation of the procedure. Initial ultrasound scanning demonstrates a large amount of ascites within the left lower abdominal quadrant. The left lower abdomen was prepped and draped in the usual sterile fashion. 1% lidocaine was used for local anesthesia. Following this, a 6 Fr Safe-T-Centesis catheter was introduced. An ultrasound image was saved for documentation purposes. The paracentesis was performed. The catheter was removed and a dressing was applied. The patient tolerated the procedure well without immediate post procedural complication. FINDINGS: A total of  approximately 4.9 L of yellow fluid was removed. IMPRESSION: Successful ultrasound-guided paracentesis yielding 4.9 liters of peritoneal fluid. Electronically Signed   By: [name] M.D.   On: 11/28/2016 13:00     CBC  Recent Labs Lab 12/20/16 1235 12/21/16 0612  WBC 8.3  --   HGB 7.7* 6.6*  HCT 25.0*  --   PLT 236  --   MCV 78.1*  --   MCH 23.9*  --   MCHC 30.7*  --   RDW 23.6*  --     Chemistries   Recent Labs Lab 12/20/16 1235 12/21/16 0612  NA 140 139  K 6.3* 5.2*  CL 98* 100*  CO2 25 28  GLUCOSE 98 110*  BUN 65* 41*  CREATININE 9.82* 6.75*  CALCIUM 8.7* 8.3*   ------------------------------------------------------------------------------------------------------------------ estimated creatinine clearance is 14.4 mL/min (A) (by C-G formula based on SCr of 6.75 mg/dL (H)). ------------------------------------------------------------------------------------------------------------------ No results for input(s): HGBA1C in the last 72 hours. ------------------------------------------------------------------------------------------------------------------ No results for input(s): CHOL, HDL, LDLCALC, TRIG, CHOLHDL, LDLDIRECT in the last 72 hours. ------------------------------------------------------------------------------------------------------------------ No results for input(s): TSH, T4TOTAL, T3FREE, THYROIDAB in the last 72 hours.  Invalid input(s): FREET3 ------------------------------------------------------------------------------------------------------------------ No results for input(s): VITAMINB12, FOLATE, FERRITIN, TIBC, IRON, RETICCTPCT in the last 72 hours.  Coagulation profile  Recent Labs Lab 12/20/16 1440  INR 1.09    No results for input(s): DDIMER in the last 72 hours.  Cardiac Enzymes  Recent Labs Lab 12/20/16 1236  TROPONINI 0.11*    ------------------------------------------------------------------------------------------------------------------ Invalid input(s): POCBNP    Assessment & Plan  Patient is a 56 year old with multiple medical problems  1. Hyperkalemia. Patient underwent urgent hemodialysis potassium now normal  2. Acute on chronic diastolic CHF due to fluid overload. Continue Lasix  3. Large amount of ascites due to liver cirrhosis. Ultrasound guided paracentesis. Later today  4. GI bleeding. With anemia Hold aspirin, continue Protonix GI consult for possible endoscopy due to previous history of AVMs   5. CAD. Hold aspirin due to GI bleeding. Continue Lipitor.  6. Anemia of chronic disease and possible acute blood loss. Follow-up hemoglobin. PRBC transfusion when necessary.  7. Diabetes. Continue sliding scale insulin      Code Status Orders        Start     Ordered   12/20/16 2021  Full code  Continuous     12/20/16 2020    Code Status History    Date Active Date Inactive Code Status Order ID Comments User Context   11/26/2016 10:30 AM 11/28/2016  3:22 PM Full Code [id number]  Henreitta Leber, MD Inpatient   11/15/2016  7:30 PM 11/16/2016  6:22 PM Full Code [id number]  Fritzi Mandes, MD Inpatient   10/29/2016  2:53 PM 10/30/2016  3:06 PM Full Code [id number]  Loletha Grayer, MD ED   09/15/2016  5:57 PM 09/16/2016  6:17 PM Full Code [id number]  Dustin Flock, MD ED   08/16/2016  9:45 AM 08/18/2016  6:16 PM Full Code [id number]  Hillary Bow, MD ED   08/09/2016  4:59 PM 08/12/2016  8:34 PM Full Code [id number]  Fritzi Mandes, MD Inpatient   07/03/2016  9:54 PM 07/05/2016  9:22 PM Full Code [id number]  Demetrios Loll, MD Inpatient   06/24/2016  4:23 AM 06/26/2016  3:46 PM Full Code [id number]  Saundra Shelling, MD Inpatient   06/14/2016  2:53 PM 06/19/2016  9:04 PM Full Code [id number]  Hillary Bow, MD ED   05/31/2016  6:42 AM 06/01/2016  9:30 PM Full Code [id number]  Saundra Shelling, MD Inpatient   05/05/2016 11:09 PM  05/10/2016  9:57 PM Full Code [id number]  Vianne Bulls, MD Inpatient   04/16/2016  3:56 AM 04/17/2016  6:27 PM Full Code [id number]  Saundra Shelling, MD Inpatient   01/19/2016  3:13 PM 01/22/2016  2:14 PM Full Code [id number]  Loletha Grayer, MD ED   09/24/2015 10:33 AM 09/25/2015  1:31 PM Full Code [id number]  Loletha Grayer, MD ED   05/16/2015  2:17 PM 05/17/2015  8:57 PM Full Code [id number]  Idelle Crouch, MD Inpatient   04/30/2015  8:08 PM 05/08/2015  7:40 PM Full Code [id number]  Vaughan Basta, MD Inpatient   04/06/2015  5:07 AM 04/07/2015  5:36 PM Full Code [id number]  Lance Coon, MD Inpatient   03/23/2015 11:07 AM 03/27/2015 12:46 PM Full Code [id number]  Bettey Costa, MD Inpatient   06/19/2013 11:32 AM 06/21/2013  5:49 PM Full Code [id number]  Cristal Ford, DO Inpatient   06/16/2013  5:26 PM 06/19/2013 11:32 AM Full Code [id number]  Margarita Mail, PA-C ED   06/05/2013  3:33 PM 06/06/2013  6:56 PM Full Code [id number]  Theodoro Kos, DO Inpatient   06/02/2013  9:01 AM 06/05/2013  3:33 PM Full Code [id number]  Velvet Bathe, MD Inpatient   06/02/2013  8:40 AM 06/02/2013  9:01 AM Full Code [id number]  Velvet Bathe, MD Inpatient           Consults  Nephrology, GI DVT Prophylaxis  scd's   Lab Results  Component Value Date   PLT 236 12/20/2016     Time Spent in minutes   18min  Greater than 50% of time spent in care coordination and counseling patient regarding the condition and plan of care.   Dustin Flock M.D on 12/21/2016 at 1:49 PM  Between 7am to 6pm - Pager - [phone]  After 6pm go to [email or web address] - password [email or web address] [facility]   Office  [phone]

## 2016-12-21 NOTE — Progress Notes (Signed)
Came in to see the patient . He had left AMA per nurses  Dr [name] MD,MRCP [facility]) Gastroenterology/Hepatology Pager: [phone]

## 2016-12-21 NOTE — Clinical Social Work Note (Addendum)
Patient with multiple admissions and with notable history of medical noncompliance. Nursing consulted CSW for "no support system." CSW attempted to see patient however patient is currently downstairs receiving getting a paracentesis.   12:14pm: Upon return to the medical floor from his paracentesis, patient immediately left AMA.  On previous admission, RN CM made a DSS APS report due concerns for self neglect, multiple admissions, missing dialysis outpatient treatments, missing paracentesis appointments and leaving hospital multiple times AMA. DSS APS rejected the report and did not open it for investigation. Shela Leff MSW,LcSW [phone]

## 2016-12-22 ENCOUNTER — Encounter: Payer: Self-pay | Admitting: Emergency Medicine

## 2016-12-22 ENCOUNTER — Other Ambulatory Visit: Payer: Self-pay

## 2016-12-22 ENCOUNTER — Inpatient Hospital Stay
Admission: EM | Admit: 2016-12-22 | Discharge: 2016-12-24 | DRG: 291 | Discharge status: Against Medical Advice | Payer: Medicare Other | Attending: Internal Medicine | Admitting: Internal Medicine

## 2016-12-22 DIAGNOSIS — F17210 Nicotine dependence, cigarettes, uncomplicated: Secondary | ICD-10-CM | POA: Diagnosis present

## 2016-12-22 DIAGNOSIS — Z9115 Patient's noncompliance with renal dialysis: Secondary | ICD-10-CM

## 2016-12-22 DIAGNOSIS — K219 Gastro-esophageal reflux disease without esophagitis: Secondary | ICD-10-CM | POA: Diagnosis present

## 2016-12-22 DIAGNOSIS — R7989 Other specified abnormal findings of blood chemistry: Secondary | ICD-10-CM | POA: Diagnosis not present

## 2016-12-22 DIAGNOSIS — E1151 Type 2 diabetes mellitus with diabetic peripheral angiopathy without gangrene: Secondary | ICD-10-CM | POA: Diagnosis present

## 2016-12-22 DIAGNOSIS — Z992 Dependence on renal dialysis: Secondary | ICD-10-CM

## 2016-12-22 DIAGNOSIS — K7290 Hepatic failure, unspecified without coma: Secondary | ICD-10-CM | POA: Diagnosis present

## 2016-12-22 DIAGNOSIS — Z79899 Other long term (current) drug therapy: Secondary | ICD-10-CM | POA: Diagnosis not present

## 2016-12-22 DIAGNOSIS — D631 Anemia in chronic kidney disease: Secondary | ICD-10-CM | POA: Diagnosis present

## 2016-12-22 DIAGNOSIS — E875 Hyperkalemia: Secondary | ICD-10-CM | POA: Diagnosis not present

## 2016-12-22 DIAGNOSIS — F1011 Alcohol abuse, in remission: Secondary | ICD-10-CM | POA: Diagnosis present

## 2016-12-22 DIAGNOSIS — M6281 Muscle weakness (generalized): Secondary | ICD-10-CM | POA: Diagnosis not present

## 2016-12-22 DIAGNOSIS — D500 Iron deficiency anemia secondary to blood loss (chronic): Secondary | ICD-10-CM | POA: Diagnosis not present

## 2016-12-22 DIAGNOSIS — N2581 Secondary hyperparathyroidism of renal origin: Secondary | ICD-10-CM | POA: Diagnosis present

## 2016-12-22 DIAGNOSIS — I2510 Atherosclerotic heart disease of native coronary artery without angina pectoris: Secondary | ICD-10-CM | POA: Diagnosis present

## 2016-12-22 DIAGNOSIS — Z7982 Long term (current) use of aspirin: Secondary | ICD-10-CM | POA: Diagnosis not present

## 2016-12-22 DIAGNOSIS — Z9119 Patient's noncompliance with other medical treatment and regimen: Secondary | ICD-10-CM

## 2016-12-22 DIAGNOSIS — I132 Hypertensive heart and chronic kidney disease with heart failure and with stage 5 chronic kidney disease, or end stage renal disease: Secondary | ICD-10-CM | POA: Diagnosis not present

## 2016-12-22 DIAGNOSIS — J449 Chronic obstructive pulmonary disease, unspecified: Secondary | ICD-10-CM | POA: Diagnosis present

## 2016-12-22 DIAGNOSIS — N186 End stage renal disease: Secondary | ICD-10-CM | POA: Diagnosis present

## 2016-12-22 DIAGNOSIS — K922 Gastrointestinal hemorrhage, unspecified: Secondary | ICD-10-CM

## 2016-12-22 DIAGNOSIS — R188 Other ascites: Secondary | ICD-10-CM | POA: Diagnosis present

## 2016-12-22 DIAGNOSIS — E1122 Type 2 diabetes mellitus with diabetic chronic kidney disease: Secondary | ICD-10-CM | POA: Diagnosis present

## 2016-12-22 DIAGNOSIS — D649 Anemia, unspecified: Secondary | ICD-10-CM | POA: Diagnosis present

## 2016-12-22 DIAGNOSIS — K7460 Unspecified cirrhosis of liver: Secondary | ICD-10-CM | POA: Diagnosis present

## 2016-12-22 DIAGNOSIS — E785 Hyperlipidemia, unspecified: Secondary | ICD-10-CM | POA: Diagnosis present

## 2016-12-22 DIAGNOSIS — I509 Heart failure, unspecified: Secondary | ICD-10-CM | POA: Diagnosis present

## 2016-12-22 DIAGNOSIS — I10 Essential (primary) hypertension: Secondary | ICD-10-CM | POA: Diagnosis not present

## 2016-12-22 DIAGNOSIS — E1142 Type 2 diabetes mellitus with diabetic polyneuropathy: Secondary | ICD-10-CM | POA: Diagnosis present

## 2016-12-22 DIAGNOSIS — D62 Acute posthemorrhagic anemia: Secondary | ICD-10-CM | POA: Diagnosis not present

## 2016-12-22 LAB — CBC
HCT: 27.3 % — ABNORMAL LOW (ref 40.0–52.0)
HEMOGLOBIN: 8.2 g/dL — AB (ref 13.0–18.0)
MCH: 23 pg — AB (ref 26.0–34.0)
MCHC: 30 g/dL — AB (ref 32.0–36.0)
MCV: 76.8 fL — ABNORMAL LOW (ref 80.0–100.0)
PLATELETS: 250 10*3/uL (ref 150–440)
RBC: 3.56 MIL/uL — AB (ref 4.40–5.90)
RDW: 23.5 % — ABNORMAL HIGH (ref 11.5–14.5)
WBC: 5.5 10*3/uL (ref 3.8–10.6)

## 2016-12-22 LAB — BASIC METABOLIC PANEL
Anion gap: 17 — ABNORMAL HIGH (ref 5–15)
BUN: 50 mg/dL — ABNORMAL HIGH (ref 6–20)
CALCIUM: 8.7 mg/dL — AB (ref 8.9–10.3)
CHLORIDE: 93 mmol/L — AB (ref 101–111)
CO2: 27 mmol/L (ref 22–32)
CREATININE: 8.45 mg/dL — AB (ref 0.61–1.24)
GFR calc non Af Amer: 6 mL/min — ABNORMAL LOW (ref >60)
GFR, EST AFRICAN AMERICAN: 7 mL/min — AB (ref >60)
GLUCOSE: 96 mg/dL (ref 65–99)
Potassium: 5.7 mmol/L — ABNORMAL HIGH (ref 3.5–5.1)
Sodium: 137 mmol/L (ref 135–145)

## 2016-12-22 LAB — PROTIME-INR
INR: 1.15
PROTHROMBIN TIME: 14.6 s (ref 11.4–15.2)

## 2016-12-22 MED ORDER — SODIUM CHLORIDE 0.9% FLUSH
3.0000 mL | Freq: Two times a day (BID) | INTRAVENOUS | Status: DC
  Administered 2016-12-23 (×2): 3 mL via INTRAVENOUS

## 2016-12-22 MED ORDER — GABAPENTIN 100 MG PO CAPS
100.0000 mg | ORAL_CAPSULE | Freq: Every day | ORAL | Status: DC
  Administered 2016-12-22 – 2016-12-23 (×2): 100 mg via ORAL
  Filled 2016-12-22 (×3): qty 1

## 2016-12-22 MED ORDER — ACETAMINOPHEN 325 MG PO TABS
650.0000 mg | ORAL_TABLET | Freq: Four times a day (QID) | ORAL | Status: DC | PRN
  Administered 2016-12-22: 650 mg via ORAL
  Filled 2016-12-22: qty 2

## 2016-12-22 MED ORDER — ACETAMINOPHEN 650 MG RE SUPP: 650.0000 mg | Freq: Four times a day (QID) | RECTAL | Status: DC | PRN

## 2016-12-22 MED ORDER — SODIUM CHLORIDE 0.9 % IV SOLN: 250.0000 mL | INTRAVENOUS | Status: DC | PRN

## 2016-12-22 MED ORDER — PANTOPRAZOLE SODIUM 40 MG PO TBEC
40.0000 mg | DELAYED_RELEASE_TABLET | Freq: Two times a day (BID) | ORAL | Status: DC
  Administered 2016-12-22 – 2016-12-23 (×3): 40 mg via ORAL
  Filled 2016-12-22 (×3): qty 1

## 2016-12-22 MED ORDER — PANTOPRAZOLE SODIUM 40 MG PO TBEC
DELAYED_RELEASE_TABLET | ORAL | Status: AC
  Administered 2016-12-22: 40 mg via ORAL
  Filled 2016-12-22: qty 1

## 2016-12-22 MED ORDER — SODIUM CHLORIDE 0.9% FLUSH
3.0000 mL | Freq: Two times a day (BID) | INTRAVENOUS | Status: DC
  Administered 2016-12-22 – 2016-12-23 (×4): 3 mL via INTRAVENOUS

## 2016-12-22 MED ORDER — ALBUTEROL SULFATE (2.5 MG/3ML) 0.083% IN NEBU: 2.5000 mg | INHALATION_SOLUTION | RESPIRATORY_TRACT | Status: DC | PRN

## 2016-12-22 MED ORDER — ALUM & MAG HYDROXIDE-SIMETH 200-200-20 MG/5ML PO SUSP
30.0000 mL | Freq: Four times a day (QID) | ORAL | Status: DC | PRN
  Administered 2016-12-22: 30 mL via ORAL
  Filled 2016-12-22: qty 30

## 2016-12-22 MED ORDER — SEVELAMER CARBONATE 800 MG PO TABS
2400.0000 mg | ORAL_TABLET | Freq: Three times a day (TID) | ORAL | Status: DC
  Administered 2016-12-22 – 2016-12-23 (×3): 2400 mg via ORAL
  Filled 2016-12-22 (×4): qty 3

## 2016-12-22 MED ORDER — NITROGLYCERIN 0.4 MG SL SUBL: 0.4000 mg | SUBLINGUAL_TABLET | SUBLINGUAL | Status: DC | PRN

## 2016-12-22 MED ORDER — ONDANSETRON HCL 4 MG PO TABS: 4.0000 mg | ORAL_TABLET | Freq: Four times a day (QID) | ORAL | Status: DC | PRN

## 2016-12-22 MED ORDER — ATORVASTATIN CALCIUM 20 MG PO TABS
40.0000 mg | ORAL_TABLET | Freq: Every day | ORAL | Status: DC
  Administered 2016-12-22: 40 mg via ORAL
  Filled 2016-12-22: qty 2

## 2016-12-22 MED ORDER — FUROSEMIDE 40 MG PO TABS
80.0000 mg | ORAL_TABLET | Freq: Every day | ORAL | Status: DC
  Administered 2016-12-22 – 2016-12-23 (×2): 80 mg via ORAL
  Filled 2016-12-22 (×2): qty 2

## 2016-12-22 MED ORDER — LABETALOL HCL 100 MG PO TABS
100.0000 mg | ORAL_TABLET | Freq: Every day | ORAL | Status: DC
  Filled 2016-12-22 (×3): qty 1

## 2016-12-22 MED ORDER — TIOTROPIUM BROMIDE MONOHYDRATE 18 MCG IN CAPS
18.0000 ug | ORAL_CAPSULE | Freq: Every day | RESPIRATORY_TRACT | Status: DC
  Filled 2016-12-22: qty 5

## 2016-12-22 MED ORDER — TRAMADOL HCL 50 MG PO TABS
50.0000 mg | ORAL_TABLET | Freq: Four times a day (QID) | ORAL | Status: DC | PRN
  Administered 2016-12-22 – 2016-12-24 (×5): 50 mg via ORAL
  Filled 2016-12-22 (×5): qty 1

## 2016-12-22 MED ORDER — MOMETASONE FURO-FORMOTEROL FUM 200-5 MCG/ACT IN AERO
2.0000 | INHALATION_SPRAY | Freq: Two times a day (BID) | RESPIRATORY_TRACT | Status: DC
  Administered 2016-12-23 (×2): 2 via RESPIRATORY_TRACT

## 2016-12-22 MED ORDER — SODIUM CHLORIDE 0.9% FLUSH: 3.0000 mL | INTRAVENOUS | Status: DC | PRN

## 2016-12-22 MED ORDER — GI COCKTAIL ~~LOC~~
30.0000 mL | Freq: Once | ORAL | Status: AC
  Administered 2016-12-22: 30 mL via ORAL
  Filled 2016-12-22: qty 30

## 2016-12-22 MED ORDER — POLYETHYLENE GLYCOL 3350 17 G PO PACK: 17.0000 g | PACK | Freq: Every day | ORAL | Status: DC | PRN

## 2016-12-22 MED ORDER — ONDANSETRON HCL 4 MG/2ML IJ SOLN: 4.0000 mg | Freq: Four times a day (QID) | INTRAMUSCULAR | Status: DC | PRN

## 2016-12-22 NOTE — ED Notes (Signed)
Hospitalist in to see patient at this time.

## 2016-12-22 NOTE — ED Triage Notes (Signed)
Pt come into the ED via [facility] from dialysis where he c/o weakness.  Patient had a paracentesis yesterday where they removed 4.5L.  Patient was supposed to stay for a blood transfusion but had to leave due to his ride threatening to leave him.  Patient c/o weakness and need for the transfusion today at the dialysis center.  Patient VS stable with EMS and patient in NAD at this time with even and unlabored respirations.  Not symptomatic at this time and has WDL skin, respirations, and neurologically intact.

## 2016-12-22 NOTE — H&P (Signed)
[facility] NAME: Stanley [name]    MR#:  [id number]  DATE OF BIRTH:  Jan 07, 1961  DATE OF ADMISSION:  12/22/2016  PRIMARY CARE PHYSICIAN: [name], MD   REQUESTING/REFERRING PHYSICIAN: Dr. Burlene Arnt  CHIEF COMPLAINT:   Chief Complaint  Patient presents with  . Weakness    HISTORY OF PRESENT ILLNESS:  Stanley [name]  is a 56 y.o. male with a known history of end-stage renal disease on hemodialysis, chronic anemia, small bowel angioectasia with chronic GI blood loss, noncompliance, polysubstance abuse presents to the emergency room complaining of weakness from the dialysis center. Patient was here in the hospital recently and left [place] yesterday which he normally does. His hemoglobin was found to be 6.6 yesterday and was supposed to get transfusion. He tells me he left because his ride would leave without him. Today in the emergency room his stool is black and positive for occult blood. Hemoglobin is 8.2. Patient feels weak. He is also due for dialysis with potassium of 5.7 and is being admitted for inpatient hemodialysis and GI workup. No pain.  PAST MEDICAL HISTORY:   Past Medical History:  Diagnosis Date  . Alcohol abuse   . CHF (congestive heart failure) ([place])   . Cirrhosis ([place])   . Coronary artery disease 2009  . Diabetic peripheral neuropathy ([place])   . Drug abuse ([place])   . End stage renal disease on dialysis [facility]) NEPHROLOGIST-   [facility]  IN [place]   HEMODIALYSIS --   TUES/  THURS/  SAT  . GERD (gastroesophageal reflux disease)   . Hyperlipidemia   . Hypertension   . PAD (peripheral artery disease) ([place])   . Renal insufficiency    Per pt, 32 oz fluid restriction per day  . S/P triple vessel bypass 06/09/2016   2009ish  . Suicidal ideation    & HOMICIDAL IDEATION --  06-16-2013   ADMITTED TO BEHAVIOR HEALTH    PAST SURGICAL HISTORY:   Past Surgical History:  Procedure Laterality  Date  . AGILE CAPSULE N/A 06/19/2016   Procedure: AGILE CAPSULE;  Surgeon: [name], MD;  Location: [facility] ENDOSCOPY;  Service: Endoscopy;  Laterality: N/A;  . COLONOSCOPY WITH PROPOFOL N/A 06/18/2016   Procedure: COLONOSCOPY WITH PROPOFOL;  Surgeon: [name], MD;  Location: [facility] ENDOSCOPY;  Service: Endoscopy;  Laterality: N/A;  . COLONOSCOPY WITH PROPOFOL N/A 08/12/2016   Procedure: COLONOSCOPY WITH PROPOFOL;  Surgeon: [name], MD;  Location: [facility] ENDOSCOPY;  Service: Endoscopy;  Laterality: N/A;  . CORONARY ANGIOPLASTY  ?   PT UNABLE TO TELL IF  BEFORE OR AFTER  CABG  . CORONARY ARTERY BYPASS GRAFT  2008  ([place] , [place])   3 VESSEL  . DIALYSIS FISTULA CREATION  LAST SURGERY  APPOX  2008  . ENTEROSCOPY N/A 05/10/2016   Procedure: ENTEROSCOPY;  Surgeon: Jerene Bears, MD;  Location: [place];  Service: Gastroenterology;  Laterality: N/A;  . ENTEROSCOPY N/A 08/12/2016   Procedure: ENTEROSCOPY;  Surgeon: [name], MD;  Location: [facility] ENDOSCOPY;  Service: Endoscopy;  Laterality: N/A;  . ESOPHAGOGASTRODUODENOSCOPY N/A 05/07/2015   Procedure: ESOPHAGOGASTRODUODENOSCOPY (EGD);  Surgeon: Hulen Luster, MD;  Location: [facility] ENDOSCOPY;  Service: Endoscopy;  Laterality: N/A;  . ESOPHAGOGASTRODUODENOSCOPY (EGD) WITH PROPOFOL N/A 05/17/2015   Procedure: ESOPHAGOGASTRODUODENOSCOPY (EGD) WITH PROPOFOL;  Surgeon: [name], MD;  Location: [facility] ENDOSCOPY;  Service: Endoscopy;  Laterality: N/A;  . ESOPHAGOGASTRODUODENOSCOPY (EGD) WITH PROPOFOL N/A 01/20/2016  Procedure: ESOPHAGOGASTRODUODENOSCOPY (EGD) WITH PROPOFOL;  Surgeon: [name], MD;  Location: [facility] ENDOSCOPY;  Service: Endoscopy;  Laterality: N/A;  . ESOPHAGOGASTRODUODENOSCOPY (EGD) WITH PROPOFOL N/A 04/17/2016   Procedure: ESOPHAGOGASTRODUODENOSCOPY (EGD) WITH PROPOFOL;  Surgeon: Lin Landsman, MD;  Location: [facility] ENDOSCOPY;  Service: Gastroenterology;  Laterality: N/A;  . ESOPHAGOGASTRODUODENOSCOPY (EGD) WITH PROPOFOL  05/09/2016   Procedure:  ESOPHAGOGASTRODUODENOSCOPY (EGD) WITH PROPOFOL;  Surgeon: Jerene Bears, MD;  Location: [place];  Service: Endoscopy;;  . ESOPHAGOGASTRODUODENOSCOPY (EGD) WITH PROPOFOL N/A 06/16/2016   Procedure: ESOPHAGOGASTRODUODENOSCOPY (EGD) WITH PROPOFOL;  Surgeon: [name], MD;  Location: [facility] ENDOSCOPY;  Service: Endoscopy;  Laterality: N/A;  . GIVENS CAPSULE STUDY N/A 05/07/2016   Procedure: GIVENS CAPSULE STUDY;  Surgeon: Doran Stabler, MD;  Location: [place];  Service: Endoscopy;  Laterality: N/A;  . MANDIBULAR HARDWARE REMOVAL N/A 07/29/2013   Procedure: REMOVAL OF ARCH BARS;  Surgeon: Theodoro Kos, DO;  Location: [place];  Service: Plastics;  Laterality: N/A;  . ORIF MANDIBULAR FRACTURE N/A 06/05/2013   Procedure: REPAIR OF MANDIBULAR FRACTURE x 2 with maxillo-mandibular fixation ;  Surgeon: Theodoro Kos, DO;  Location: [place];  Service: Plastics;  Laterality: N/A;  . PERIPHERAL ARTERIAL STENT GRAFT Left     SOCIAL HISTORY:   Social History  Substance Use Topics  . Smoking status: Current Every Day Smoker    Packs/day: 0.15    Years: 40.00    Types: Cigarettes  . Smokeless tobacco: Never Used  . Alcohol use No     Comment: pt reports quitting after learning about cirrhosis    FAMILY HISTORY:   Family History  Problem Relation Age of Onset  . Colon cancer Mother   . Cancer Father   . Cancer Sister   . Kidney disease Brother     DRUG ALLERGIES:  No Known Allergies  REVIEW OF SYSTEMS:   Review of Systems  Constitutional: Positive for malaise/fatigue. Negative for chills, fever and weight loss.  HENT: Negative for hearing loss and nosebleeds.   Eyes: Negative for blurred vision, double vision and pain.  Respiratory: Negative for cough, hemoptysis, sputum production, shortness of breath and wheezing.   Cardiovascular: Negative for chest pain, palpitations, orthopnea and leg swelling.  Gastrointestinal: Positive for melena. Negative for abdominal  pain, constipation, diarrhea, nausea and vomiting.  Genitourinary: Negative for dysuria and hematuria.  Musculoskeletal: Negative for back pain, falls and myalgias.  Skin: Negative for rash.  Neurological: Positive for weakness. Negative for dizziness, tremors, sensory change, speech change, focal weakness, seizures and headaches.  Endo/Heme/Allergies: Does not bruise/bleed easily.  Psychiatric/Behavioral: Negative for depression and memory loss. The patient is not nervous/anxious.     MEDICATIONS AT HOME:   Prior to Admission medications   Medication Sig Start Date End Date Taking? Authorizing Provider  albuterol (PROVENTIL HFA;VENTOLIN HFA) 108 (90 Base) MCG/ACT inhaler Inhale 4-6 puffs by mouth every 4 hours as needed for wheezing, cough, and/or shortness of breath 11/16/16  Yes Vaughan Basta, MD  aspirin EC 81 MG tablet Take 1 tablet (81 mg total) by mouth daily. 11/16/16  Yes Vaughan Basta, MD  atorvastatin (LIPITOR) 40 MG tablet Take 1 tablet (40 mg total) by mouth daily. 11/16/16  Yes Vaughan Basta, MD  budesonide-formoterol [facility]) 160-4.5 MCG/ACT inhaler Inhale 2 puffs into the lungs daily. 11/16/16  Yes Vaughan Basta, MD  furosemide (LASIX) 80 MG tablet Take 1 tablet (80 mg total) by mouth daily. 11/16/16  Yes Vaughan Basta, MD  gabapentin (NEURONTIN) 300  MG capsule TAKE 1 CAPSULE BY MOUTH EVERY EVENING AND 1 EXTRA CAP ON DIALYSIS DAYS AS NEEDED FOR NERVE PAIN 08/16/16  Yes [provider]  ipratropium-albuterol (DUONEB) 0.5-2.5 (3) MG/3ML SOLN Take 3 mLs by nebulization every 6 (six) hours as needed. 11/28/16  Yes Wieting, Richard, MD  labetalol (NORMODYNE) 100 MG tablet Take 1 tablet (100 mg total) by mouth daily. 11/16/16  Yes Vaughan Basta, MD  Multiple Vitamins-Minerals-FA (DIALYVITE SUPREME D) 3 MG TABS Take 1 tablet (3 mg total) by mouth daily. 11/28/16  Yes Wieting, Richard, MD  nitroGLYCERIN (NITROSTAT) 0.4 MG SL tablet Place  1 tablet (0.4 mg total) under the tongue every 5 (five) minutes as needed. 04/12/16  Yes Wende Bushy, MD  pantoprazole (PROTONIX) 40 MG tablet Take 1 tablet (40 mg total) by mouth daily. 11/28/16  Yes Wieting, Richard, MD  sevelamer carbonate (RENVELA) 800 MG tablet Take 3 tablets (2,400 mg total) by mouth 3 (three) times daily with meals. 11/16/16  Yes Vaughan Basta, MD  tiotropium (SPIRIVA HANDIHALER) 18 MCG inhalation capsule Place 1 capsule (18 mcg total) into inhaler and inhale daily. 11/16/16 11/16/17 Yes Vaughan Basta, MD  predniSONE (DELTASONE) 10 MG tablet 4 tabs daily for 4 days Patient not taking: Reported on 12/22/2016 11/28/16   Loletha Grayer, MD     VITAL SIGNS:  Blood pressure 127/74, pulse 85, temperature 98.3 F (36.8 C), temperature source Oral, resp. rate 18, height 6\' 3"  (1.905 m), weight 88.5 kg (195 lb), SpO2 100 %.  PHYSICAL EXAMINATION:  Physical Exam  GENERAL:  56 y.o.-year-old patient lying in the bed with no acute distress.  EYES: Pupils equal, round, reactive to light and accommodation. No scleral icterus. Extraocular muscles intact.  HEENT: Head atraumatic, normocephalic. Oropharynx and nasopharynx clear. No oropharyngeal erythema, moist oral mucosa  NECK:  Supple, no jugular venous distention. No thyroid enlargement, no tenderness.  LUNGS: Normal breath sounds bilaterally, no wheezing, rales, rhonchi. No use of accessory muscles of respiration.  CARDIOVASCULAR: S1, S2 normal. No murmurs, rubs, or gallops.  ABDOMEN: Soft, nontender, nondistended. Bowel sounds present. No organomegaly or mass.  EXTREMITIES: No pedal edema, cyanosis, or clubbing. + 2 pedal & radial pulses b/l.   NEUROLOGIC: Cranial nerves II through XII are intact. No focal Motor or sensory deficits appreciated b/l PSYCHIATRIC: The patient is alert and oriented x 3. Flat affect SKIN: No obvious rash, lesion, or ulcer.   LABORATORY PANEL:   CBC  Recent Labs Lab 12/22/16 1201   WBC 5.5  HGB 8.2*  HCT 27.3*  PLT 250   ------------------------------------------------------------------------------------------------------------------  Chemistries   Recent Labs Lab 12/22/16 1201  NA 137  K 5.7*  CL 93*  CO2 27  GLUCOSE 96  BUN 50*  CREATININE 8.45*  CALCIUM 8.7*   ------------------------------------------------------------------------------------------------------------------  Cardiac Enzymes  Recent Labs Lab 12/20/16 1236  TROPONINI 0.11*   ------------------------------------------------------------------------------------------------------------------  RADIOLOGY:  US Paracentesis  Result Date: 12/21/2016 INDICATION: Recurrent ascites. EXAM: ULTRASOUND GUIDED PARACENTESIS MEDICATIONS: None. COMPLICATIONS: None immediate. PROCEDURE: Informed written consent was obtained from the patient after a discussion of the risks, benefits and alternatives to treatment. A timeout was performed prior to the initiation of the procedure. Initial ultrasound scanning demonstrates a large amount of ascites within the left lower abdominal quadrant. The left lower abdomen was prepped and draped in the usual sterile fashion. 1% lidocaine was used for local anesthesia. Following this, a 6 Fr Safe-T-Centesis catheter was introduced. An ultrasound image was saved for documentation purposes. The paracentesis was performed.  The catheter was removed and a dressing was applied. The patient tolerated the procedure well without immediate post procedural complication. FINDINGS: A total of approximately 4.95 L of yellow fluid was removed. IMPRESSION: Successful ultrasound-guided paracentesis yielding 4.95 liters of peritoneal fluid. Electronically Signed   By: [name] M.D.   On: 12/21/2016 12:24    IMPRESSION AND PLAN   * ESRD with hyperkalemia Will need inpatient HD. He is non compliant with his hemodialysis. Consult nephrology for HD today Tele monitoring  * Chronic  anemia with chronic GI blood losses Has small bowel angioectasia Monitor HB Hem positive stoool in ED Consult GI No need for transfusion at this time.  * Hypertension. Continue home medications.  * Polysubstance abuse. Check drug screen.  DVT prophylaxis with SCDs  I have high suspicion that patient will leave against medical advise like he normally does.  All the records are reviewed and case discussed with ED provider. Management plans discussed with the patient, family and they are in agreement.  CODE STATUS: FULL CODE  TOTAL TIME TAKING CARE OF THIS PATIENT: 40 minutes.   Hillary Bow R M.D on 12/22/2016 at 2:43 PM  Between 7am to 6pm - Pager - [phone]  After 6pm go to [email or web address] - password [email or web address] [place] [facility]  Office  [phone]  CC: Primary care physician; [name], MD  Note: This dictation was prepared with Dragon dictation along with smaller phrase technology. Any transcriptional errors that result from this process are unintentional.

## 2016-12-22 NOTE — ED Notes (Signed)
Report called to Barnabas Lister RN on the floor. Pt NAD, VSS, pt is eating his dinner at this time.

## 2016-12-22 NOTE — ED Provider Notes (Addendum)
[facility] Emergency Department Provider Note  ____________________________________________   I have reviewed the triage vital signs and the nursing notes.   HISTORY  Chief Complaint Weakness    HPI Stanley [name] is a 56 y.o. male  history of EtOH abuse CHF or compliance cirrhosis coronary artery disease drug abuse and states renal disease on dialysis Tuesday Thursday Saturday, patient was seen here a few days ago, I personally saw him at that time he is complaining about his ascites, he had his ascites tapped, his potassium was 6.3 that time, he had dialysis, he went home but instead of following up closely with dialysis today, he elected to come back to the emergency room. Patient was having black stool that time he continues to have melanotic stools he states that he feels generally weak. Patient was supposed to have a blood transfusion during his last admission but he elected to leave [place] however now feels that the timing is more convenient for him and he would like to be admitted for transfusion     Past Medical History:  Diagnosis Date  . Alcohol abuse   . CHF (congestive heart failure) ([place])   . Cirrhosis ([place])   . Coronary artery disease 2009  . Diabetic peripheral neuropathy ([place])   . Drug abuse ([place])   . End stage renal disease on dialysis [facility]) NEPHROLOGIST-   DR [facility]  IN [place]   HEMODIALYSIS --   TUES/  THURS/  SAT  . GERD (gastroesophageal reflux disease)   . Hyperlipidemia   . Hypertension   . PAD (peripheral artery disease) ([place])   . Renal insufficiency    Per pt, 32 oz fluid restriction per day  . S/P triple vessel bypass 06/09/2016   2009ish  . Suicidal ideation    & HOMICIDAL IDEATION --  06-16-2013   ADMITTED TO BEHAVIOR HEALTH    Patient Active Problem List   Diagnosis Date Noted  . Shortness of breath 11/26/2016  . COPD (chronic obstructive pulmonary disease) ([place]) 10/30/2016  . COPD exacerbation  ([place]) 10/29/2016  . Anemia   . Heme positive stool   . Ulceration of intestine   . Benign neoplasm of transverse colon   . Acute gastrointestinal hemorrhage   . Esophageal candidiasis ([place])   . Angiodysplasia of intestinal tract   . Acute respiratory failure with hypoxia ([place]) 07/03/2016  . GI bleeding 06/24/2016  . Rectal bleeding 06/14/2016  . Anemia of chronic disease 06/01/2016  . MRSA carrier 06/01/2016  . Chronic renal failure 05/23/2016  . Ischemic heart disease 05/23/2016  . Angiodysplasia of small intestine   . Melena   . Small bowel bleed not requiring more than 4 units of blood in 24 hours, ICU, or surgery   . Anemia due to chronic blood loss   . Abdominal pain 05/05/2016  . Acute posthemorrhagic anemia 04/17/2016  . Gastrointestinal bleed 04/17/2016  . History of esophagogastroduodenoscopy (EGD) 04/17/2016  . Elevated troponin 04/17/2016  . Alcohol abuse 04/17/2016  . Upper GI bleed 01/19/2016  . Blood in stool   . Angiodysplasia of stomach and duodenum with hemorrhage   . Gastritis   . Esophagitis, unspecified   . GI bleed 05/16/2015  . Acute GI bleeding   . Symptomatic anemia 04/30/2015  . HTN (hypertension) 04/06/2015  . GERD (gastroesophageal reflux disease) 04/06/2015  . HLD (hyperlipidemia) 04/06/2015  . Dyspnea 04/06/2015  . Cirrhosis of liver with ascites ([place]) 04/06/2015  . Ascites 04/06/2015  . GIB (  gastrointestinal bleeding) 03/23/2015  . Homicidal ideation 06/19/2013  . Suicidal intent 06/19/2013  . Homicidal ideations 06/19/2013  . Hyperkalemia 06/16/2013  . Mandible fracture ([place]) 06/05/2013  . Fracture, mandible ([place]) 06/02/2013  . Coronary atherosclerosis of native coronary artery 06/02/2013  . ESRD on dialysis ([place]) 06/02/2013  . Mandible open fracture ([place]) 06/02/2013    Past Surgical History:  Procedure Laterality Date  . AGILE CAPSULE N/A 06/19/2016   Procedure: AGILE CAPSULE;  Surgeon: [name], MD;  Location: [facility] ENDOSCOPY;   Service: Endoscopy;  Laterality: N/A;  . COLONOSCOPY WITH PROPOFOL N/A 06/18/2016   Procedure: COLONOSCOPY WITH PROPOFOL;  Surgeon: [name], MD;  Location: [facility] ENDOSCOPY;  Service: Endoscopy;  Laterality: N/A;  . COLONOSCOPY WITH PROPOFOL N/A 08/12/2016   Procedure: COLONOSCOPY WITH PROPOFOL;  Surgeon: [name], MD;  Location: [facility] ENDOSCOPY;  Service: Endoscopy;  Laterality: N/A;  . CORONARY ANGIOPLASTY  ?   PT UNABLE TO TELL IF  BEFORE OR AFTER  CABG  . CORONARY ARTERY BYPASS GRAFT  2008  ([place] , [place])   3 VESSEL  . DIALYSIS FISTULA CREATION  LAST SURGERY  APPOX  2008  . ENTEROSCOPY N/A 05/10/2016   Procedure: ENTEROSCOPY;  Surgeon: Jerene Bears, MD;  Location: [place];  Service: Gastroenterology;  Laterality: N/A;  . ENTEROSCOPY N/A 08/12/2016   Procedure: ENTEROSCOPY;  Surgeon: [name], MD;  Location: [facility] ENDOSCOPY;  Service: Endoscopy;  Laterality: N/A;  . ESOPHAGOGASTRODUODENOSCOPY N/A 05/07/2015   Procedure: ESOPHAGOGASTRODUODENOSCOPY (EGD);  Surgeon: Hulen Luster, MD;  Location: [facility] ENDOSCOPY;  Service: Endoscopy;  Laterality: N/A;  . ESOPHAGOGASTRODUODENOSCOPY (EGD) WITH PROPOFOL N/A 05/17/2015   Procedure: ESOPHAGOGASTRODUODENOSCOPY (EGD) WITH PROPOFOL;  Surgeon: [name], MD;  Location: [facility] ENDOSCOPY;  Service: Endoscopy;  Laterality: N/A;  . ESOPHAGOGASTRODUODENOSCOPY (EGD) WITH PROPOFOL N/A 01/20/2016   Procedure: ESOPHAGOGASTRODUODENOSCOPY (EGD) WITH PROPOFOL;  Surgeon: [name], MD;  Location: [facility] ENDOSCOPY;  Service: Endoscopy;  Laterality: N/A;  . ESOPHAGOGASTRODUODENOSCOPY (EGD) WITH PROPOFOL N/A 04/17/2016   Procedure: ESOPHAGOGASTRODUODENOSCOPY (EGD) WITH PROPOFOL;  Surgeon: Lin Landsman, MD;  Location: [facility] ENDOSCOPY;  Service: Gastroenterology;  Laterality: N/A;  . ESOPHAGOGASTRODUODENOSCOPY (EGD) WITH PROPOFOL  05/09/2016   Procedure: ESOPHAGOGASTRODUODENOSCOPY (EGD) WITH PROPOFOL;  Surgeon: Jerene Bears, MD;  Location: [place];  Service:  Endoscopy;;  . ESOPHAGOGASTRODUODENOSCOPY (EGD) WITH PROPOFOL N/A 06/16/2016   Procedure: ESOPHAGOGASTRODUODENOSCOPY (EGD) WITH PROPOFOL;  Surgeon: [name], MD;  Location: [facility] ENDOSCOPY;  Service: Endoscopy;  Laterality: N/A;  . GIVENS CAPSULE STUDY N/A 05/07/2016   Procedure: GIVENS CAPSULE STUDY;  Surgeon: Doran Stabler, MD;  Location: [place];  Service: Endoscopy;  Laterality: N/A;  . MANDIBULAR HARDWARE REMOVAL N/A 07/29/2013   Procedure: REMOVAL OF ARCH BARS;  Surgeon: Theodoro Kos, DO;  Location: [place];  Service: Plastics;  Laterality: N/A;  . ORIF MANDIBULAR FRACTURE N/A 06/05/2013   Procedure: REPAIR OF MANDIBULAR FRACTURE x 2 with maxillo-mandibular fixation ;  Surgeon: Theodoro Kos, DO;  Location: [place];  Service: Plastics;  Laterality: N/A;  . PERIPHERAL ARTERIAL STENT GRAFT Left     Prior to Admission medications   Medication Sig Start Date End Date Taking? Authorizing Provider  albuterol (PROVENTIL HFA;VENTOLIN HFA) 108 (90 Base) MCG/ACT inhaler Inhale 4-6 puffs by mouth every 4 hours as needed for wheezing, cough, and/or shortness of breath 11/16/16   Vaughan Basta, MD  aspirin EC 81 MG tablet Take 1 tablet (81 mg total) by mouth daily. 11/16/16   Vaughan Basta, MD  atorvastatin (  LIPITOR) 40 MG tablet Take 1 tablet (40 mg total) by mouth daily. 11/16/16   Vaughan Basta, MD  budesonide-formoterol (SYMBICORT) 160-4.5 MCG/ACT inhaler Inhale 2 puffs into the lungs daily. 11/16/16   Vaughan Basta, MD  furosemide (LASIX) 80 MG tablet Take 1 tablet (80 mg total) by mouth daily. 11/16/16   Vaughan Basta, MD  gabapentin (NEURONTIN) 300 MG capsule TAKE 1 CAPSULE BY MOUTH EVERY EVENING AND 1 EXTRA CAP ON DIALYSIS DAYS AS NEEDED FOR NERVE PAIN 08/16/16   [provider]  ipratropium-albuterol (DUONEB) 0.5-2.5 (3) MG/3ML SOLN Take 3 mLs by nebulization every 6 (six) hours as needed. 11/28/16   Loletha Grayer, MD   labetalol (NORMODYNE) 100 MG tablet Take 1 tablet (100 mg total) by mouth daily. 11/16/16   Vaughan Basta, MD  Multiple Vitamins-Minerals-FA (DIALYVITE SUPREME D) 3 MG TABS Take 1 tablet (3 mg total) by mouth daily. 11/28/16   Loletha Grayer, MD  nitroGLYCERIN (NITROSTAT) 0.4 MG SL tablet Place 1 tablet (0.4 mg total) under the tongue every 5 (five) minutes as needed. 04/12/16   Wende Bushy, MD  pantoprazole (PROTONIX) 40 MG tablet Take 1 tablet (40 mg total) by mouth daily. 11/28/16   Loletha Grayer, MD  predniSONE (DELTASONE) 10 MG tablet 4 tabs daily for 4 days 11/28/16   Loletha Grayer, MD  sevelamer carbonate (RENVELA) 800 MG tablet Take 3 tablets (2,400 mg total) by mouth 3 (three) times daily with meals. 11/16/16   Vaughan Basta, MD  tiotropium (SPIRIVA HANDIHALER) 18 MCG inhalation capsule Place 1 capsule (18 mcg total) into inhaler and inhale daily. 11/16/16 11/16/17  Vaughan Basta, MD    Allergies Patient has no known allergies.  Family History  Problem Relation Age of Onset  . Colon cancer Mother   . Cancer Father   . Cancer Sister   . Kidney disease Brother     Social History Social History  Substance Use Topics  . Smoking status: Current Every Day Smoker    Packs/day: 0.15    Years: 40.00    Types: Cigarettes  . Smokeless tobacco: Never Used  . Alcohol use No     Comment: pt reports quitting after learning about cirrhosis    Review of Systems Constitutional: No fever/chills Eyes: No visual changes. ENT: No sore throat. No stiff neck no neck pain Cardiovascular: Denies chest pain. Respiratory: Denies shortness of breath. Gastrointestinal:   no vomiting.  No diarrhea.  No constipation. Genitourinary: Negative for dysuria. Musculoskeletal: Negative lower extremity swelling Skin: Negative for rash. Neurological: Negative for severe headaches, focal weakness or numbness.   ____________________________________________   PHYSICAL  EXAM:  VITAL SIGNS: ED Triage Vitals  Enc Vitals Group     BP 12/22/16 1200 119/79     Pulse Rate 12/22/16 1200 82     Resp 12/22/16 1200 (!) 9     Temp 12/22/16 1201 98.3 F (36.8 C)     Temp Source 12/22/16 1201 Oral     SpO2 12/22/16 1200 100 %     Weight 12/22/16 1202 195 lb (88.5 kg)     Height 12/22/16 1202 6\' 3"  (1.905 m)     Head Circumference --      Peak Flow --      Pain Score 12/22/16 1201 8     Pain Loc --      Pain Edu? --      Excl. in [place]? --     Constitutional: Alert and oriented. Well appearing and in no acute distress.  Eyes: Conjunctivae are normal Head: Atraumatic HEENT: No congestion/rhinnorhea. Mucous membranes are moist.  Oropharynx non-erythematous Neck:   Nontender with no meningismus, no masses, no stridor Cardiovascular: Normal rate, regular rhythm. Grossly normal heart sounds.  Good peripheral circulation. Respiratory: Normal respiratory effort.  No retractions. Lungs CTAB. Abdominal: Soft and nontender. No distention. No guarding no rebound Back:  There is no focal tenderness or step off.  there is no midline  tenderness there are no lesions noted. there is no CVA tenderness rectal: Guaiac positive black stool Musculoskeletal: No lower extremity tenderness, no upper extremity tenderness. No joint effusions, no DVT signs strong distal pulses no edema Neurologic:  Normal speech and language. No gross focal neurologic deficits are appreciated.  Skin:  Skin is warm, dry and intact. No rash noted. Psychiatric: Mood and affect are normal. Speech and behavior are normal.  ____________________________________________   LABS (all labs ordered are listed, but only abnormal results are displayed)  Labs Reviewed  BASIC METABOLIC PANEL - Abnormal; Notable for the following:       Result Value   Potassium 5.7 (*)    Chloride 93 (*)    BUN 50 (*)    Creatinine, Ser 8.45 (*)    Calcium 8.7 (*)    GFR calc non Af Amer 6 (*)    GFR calc Af Amer 7 (*)     Anion gap 17 (*)    All other components within normal limits  CBC - Abnormal; Notable for the following:    RBC 3.56 (*)    Hemoglobin 8.2 (*)    HCT 27.3 (*)    MCV 76.8 (*)    MCH 23.0 (*)    MCHC 30.0 (*)    RDW 23.5 (*)    All other components within normal limits  URINALYSIS, COMPLETE (UACMP) WITH MICROSCOPIC  PROTIME-INR  CBG MONITORING, ED  TYPE AND SCREEN    Pertinent labs  results that were available during my care of the patient were reviewed by me and considered in my medical decision making (see chart for details). ____________________________________________  EKG  I personally interpreted any EKGs ordered by me or triage sinus rhythm rate 81 bpm no acute ST elevation or depression of ST changes normal axis ____________________________________________  RADIOLOGY  Pertinent labs & imaging results that were available during my care of the patient were reviewed by me and considered in my medical decision making (see chart for details). If possible, patient and/or family made aware of any abnormal findings. ____________________________________________    PROCEDURES  Procedure(s) performed: None  Procedures  Critical Care performed: None  ____________________________________________   INITIAL IMPRESSION / ASSESSMENT AND PLAN / ED COURSE  Pertinent labs & imaging results that were available during my care of the patient were reviewed by me and considered in my medical decision making (see chart for details).  patient here with guaiac positive stool elevated potassium, here for a transfusion, I have discussed with nephrology the do agree with admission he does have ongoing black stool which is guaiac positive his hemoglobin is reassuring today fortunately, this certainly could be some degree of hemoconcentration I suppose after dialysis. Hemoglobin is 8.8 potassium is 5.7. Discussed extensively the family, we will admit patient especially since his next dialysis  will be Saturday and I doubt he would make it.    ____________________________________________   FINAL CLINICAL IMPRESSION(S) / ED DIAGNOSES  Final diagnoses:  Gastrointestinal hemorrhage, unspecified gastrointestinal hemorrhage type      This chart was  dictated using voice recognition software.  Despite best efforts to proofread,  errors can occur which can change meaning.      Schuyler Amor, MD 12/22/16 1348    Schuyler Amor, MD 12/22/16 1356

## 2016-12-22 NOTE — ED Notes (Signed)
ED Provider at bedside. 

## 2016-12-23 DIAGNOSIS — D500 Iron deficiency anemia secondary to blood loss (chronic): Secondary | ICD-10-CM

## 2016-12-23 DIAGNOSIS — K922 Gastrointestinal hemorrhage, unspecified: Secondary | ICD-10-CM

## 2016-12-23 LAB — BASIC METABOLIC PANEL
Anion gap: 10 (ref 5–15)
BUN: 60 mg/dL — AB (ref 6–20)
CALCIUM: 7.9 mg/dL — AB (ref 8.9–10.3)
CO2: 27 mmol/L (ref 22–32)
Chloride: 101 mmol/L (ref 101–111)
Creatinine, Ser: 9.12 mg/dL — ABNORMAL HIGH (ref 0.61–1.24)
GFR calc non Af Amer: 6 mL/min — ABNORMAL LOW (ref >60)
GFR, EST AFRICAN AMERICAN: 7 mL/min — AB (ref >60)
Glucose, Bld: 109 mg/dL — ABNORMAL HIGH (ref 65–99)
Potassium: 6.1 mmol/L — ABNORMAL HIGH (ref 3.5–5.1)
Sodium: 138 mmol/L (ref 135–145)

## 2016-12-23 LAB — PREPARE RBC (CROSSMATCH)

## 2016-12-23 LAB — CBC
HCT: 22.5 % — ABNORMAL LOW (ref 40.0–52.0)
Hemoglobin: 6.8 g/dL — ABNORMAL LOW (ref 13.0–18.0)
MCH: 23.2 pg — AB (ref 26.0–34.0)
MCHC: 30.1 g/dL — AB (ref 32.0–36.0)
MCV: 76.9 fL — ABNORMAL LOW (ref 80.0–100.0)
PLATELETS: 232 10*3/uL (ref 150–440)
RBC: 2.93 MIL/uL — ABNORMAL LOW (ref 4.40–5.90)
RDW: 23.7 % — ABNORMAL HIGH (ref 11.5–14.5)
WBC: 5.4 10*3/uL (ref 3.8–10.6)

## 2016-12-23 LAB — HEMOGLOBIN AND HEMATOCRIT, BLOOD
HEMATOCRIT: 25.3 % — AB (ref 40.0–52.0)
HEMOGLOBIN: 7.7 g/dL — AB (ref 13.0–18.0)

## 2016-12-23 LAB — PHOSPHORUS: PHOSPHORUS: 8.1 mg/dL — AB (ref 2.5–4.6)

## 2016-12-23 MED ORDER — DIPHENHYDRAMINE HCL 25 MG PO CAPS
25.0000 mg | ORAL_CAPSULE | Freq: Once | ORAL | Status: AC
  Administered 2016-12-23: 25 mg via ORAL
  Filled 2016-12-23: qty 1

## 2016-12-23 MED ORDER — PENTAFLUOROPROP-TETRAFLUOROETH EX AERO
1.0000 "application " | INHALATION_SPRAY | CUTANEOUS | Status: DC | PRN
  Filled 2016-12-23: qty 30

## 2016-12-23 MED ORDER — HEPARIN SODIUM (PORCINE) 1000 UNIT/ML DIALYSIS
1000.0000 [IU] | INTRAMUSCULAR | Status: DC | PRN
  Filled 2016-12-23: qty 1

## 2016-12-23 MED ORDER — ALTEPLASE 2 MG IJ SOLR: 2.0000 mg | Freq: Once | INTRAMUSCULAR | Status: DC | PRN

## 2016-12-23 MED ORDER — SODIUM CHLORIDE 0.9 % IV SOLN: 100.0000 mL | INTRAVENOUS | Status: DC | PRN

## 2016-12-23 MED ORDER — SODIUM CHLORIDE 0.9 % IV SOLN: Freq: Once | INTRAVENOUS | Status: DC

## 2016-12-23 MED ORDER — DIPHENHYDRAMINE HCL 50 MG/ML IJ SOLN
12.5000 mg | Freq: Once | INTRAMUSCULAR | Status: AC
  Administered 2016-12-23: 12.5 mg via INTRAVENOUS

## 2016-12-23 MED ORDER — LIDOCAINE HCL (PF) 1 % IJ SOLN
5.0000 mL | INTRAMUSCULAR | Status: DC | PRN
  Filled 2016-12-23: qty 5

## 2016-12-23 MED ORDER — LIDOCAINE-PRILOCAINE 2.5-2.5 % EX CREA
1.0000 "application " | TOPICAL_CREAM | CUTANEOUS | Status: DC | PRN
  Filled 2016-12-23: qty 5

## 2016-12-23 NOTE — Progress Notes (Signed)
[facility] [place] NAME: Stanley [name]    MR#:  [id number]  DATE OF BIRTH:  Feb 04, 1961  SUBJECTIVE:  CHIEF COMPLAINT:   Chief Complaint  Patient presents with  . Weakness     Multiple admissions and non complaince with advises. Signed AMA 2 days ago and came back due to weakness. Blood transfusion done and HD done.  REVIEW OF SYSTEMS:  CONSTITUTIONAL: No fever, positive for fatigue or weakness.  EYES: No blurred or double vision.  EARS, NOSE, AND THROAT: No tinnitus or ear pain.  RESPIRATORY: No cough, shortness of breath, wheezing or hemoptysis.  CARDIOVASCULAR: No chest pain, orthopnea, edema.  GASTROINTESTINAL: No nausea, vomiting, diarrhea or abdominal pain.  GENITOURINARY: No dysuria, hematuria.  ENDOCRINE: No polyuria, nocturia,  HEMATOLOGY: No anemia, easy bruising or bleeding SKIN: No rash or lesion. MUSCULOSKELETAL: No joint pain or arthritis.   NEUROLOGIC: No tingling, numbness, weakness.  PSYCHIATRY: No anxiety or depression.   ROS  DRUG ALLERGIES:  No Known Allergies  VITALS:  Blood pressure 119/61, pulse 81, temperature 98.1 F (36.7 C), temperature source Oral, resp. rate 18, height 6\' 3"  (1.905 m), weight 84 kg (185 lb 3 oz), SpO2 100 %.  PHYSICAL EXAMINATION:  GENERAL:  56 y.o.-year-old patient lying in the bed with no acute distress.  EYES: Pupils equal, round, reactive to light and accommodation. No scleral icterus. Extraocular muscles intact. Conjunctiva pale. HEENT: Head atraumatic, normocephalic. Oropharynx and nasopharynx clear.  NECK:  Supple, no jugular venous distention. No thyroid enlargement, no tenderness.  LUNGS: Normal breath sounds bilaterally, no wheezing, rales,rhonchi or crepitation. No use of accessory muscles of respiration.  CARDIOVASCULAR: S1, S2 normal. No murmurs, rubs, or gallops.  ABDOMEN: Soft, nontender, nondistended. Bowel sounds present. No organomegaly or mass.  EXTREMITIES: No  pedal edema, cyanosis, or clubbing.  NEUROLOGIC: Cranial nerves II through XII are intact. Muscle strength 4-5/5 in all extremities. Sensation intact. Gait not checked.  PSYCHIATRIC: The patient is alert and oriented x 3.  SKIN: No obvious rash, lesion, or ulcer.   Physical Exam LABORATORY PANEL:   CBC  Recent Labs Lab 12/23/16 0313 12/23/16 1511  WBC 5.4  --   HGB 6.8* 7.7*  HCT 22.5* 25.3*  PLT 232  --    ------------------------------------------------------------------------------------------------------------------  Chemistries   Recent Labs Lab 12/23/16 0313  NA 138  K 6.1*  CL 101  CO2 27  GLUCOSE 109*  BUN 60*  CREATININE 9.12*  CALCIUM 7.9*   ------------------------------------------------------------------------------------------------------------------  Cardiac Enzymes  Recent Labs Lab 12/20/16 1236  TROPONINI 0.11*   ------------------------------------------------------------------------------------------------------------------  RADIOLOGY:  No results found.  ASSESSMENT AND PLAN:   Active Problems:   Anemia   * ESRD with hyperkalemia Will need inpatient HD. He is non compliant with his hemodialysis. Consult nephrology for HD today Tele monitoring  * ac on Chronic anemia with chronic GI blood losses Has small bowel angioectasia Monitor HB Hem positive stoool in ED Consult GI Transfused one unit PRBC.  * Hypertension. Continue home medications.  * Polysubstance abuse. Check drug screen.   All the records are reviewed and case discussed with Care Management/Social Workerr. Management plans discussed with the patient, family and they are in agreement.  CODE STATUS: full.  TOTAL TIME TAKING CARE OF THIS PATIENT: 35 minutes.   POSSIBLE D/C IN 1-2 DAYS, DEPENDING ON CLINICAL CONDITION.   Vaughan Basta M.D on 12/23/2016   Between 7am to 6pm - Pager - [phone]  After 6pm go  to [email or web address] - password [email or web address]  [facility]  Office  [phone]  CC: Primary care physician; [name], MD  Note: This dictation was prepared with Dragon dictation along with smaller phrase technology. Any transcriptional errors that result from this process are unintentional.

## 2016-12-23 NOTE — Progress Notes (Signed)
This note also relates to the following rows which could not be included: Pulse Rate - Cannot attach notes to unvalidated device data SpO2 - Cannot attach notes to unvalidated device data  HD STARTED

## 2016-12-23 NOTE — Progress Notes (Signed)
This note also relates to the following rows which could not be included: Pulse Rate - Cannot attach notes to unvalidated device data  HD COMPLETED

## 2016-12-23 NOTE — Progress Notes (Signed)
POST DIALYSIS ASSESSMENT 

## 2016-12-23 NOTE — Consult Note (Signed)
[name], MD [facility]  [address], [place], [place] [address] Phone: [phone] Fax : [phone]  Consultation  Referring Provider:     Dr. Darvin Neighbours Primary Care Physician:  [name], MD Primary Gastroenterologist:  Althia Forts         Reason for Consultation:     Recurrent GI bleeds  Date of Admission:  12/22/2016 Date of Consultation:  12/23/2016         HPI:   Stanley [name] is a 56 y.o. male who has been admitted multiple times with chronic anemia. The patient has had multiple EGDs and colonoscopies and has also had a capsule endoscopy without any source of his bleeding being seen.  The patient has been in the hospital 3 times with consults by me in March 2017 and June and July 2018.  At that time no sign of his source of anemia was seen.  The patient comes in with a history of black stools each time he is admitted and no source is found.  The patient typically reports shortness of breath and dizziness.  The patient was in the hospital a few days ago and left AMA prior to being seen by GI.  The patient was also recommended to undergo a double balloon enteroscopy to look for source of the patient's GI bleeding in the small bowel but has not followed through with that.  The patient was followed in the past by Dr. Candace Cruise and by gastroenterology in [place].  Past Medical History:  Diagnosis Date  . Alcohol abuse   . CHF (congestive heart failure) ([place])   . Cirrhosis ([place])   . Coronary artery disease 2009  . Diabetic peripheral neuropathy ([place])   . Drug abuse ([place])   . End stage renal disease on dialysis [facility]) NEPHROLOGIST-   DR [facility]  IN [place]   HEMODIALYSIS --   TUES/  THURS/  SAT  . GERD (gastroesophageal reflux disease)   . Hyperlipidemia   . Hypertension   . PAD (peripheral artery disease) ([place])   . Renal insufficiency    Per pt, 32 oz fluid restriction per day  . S/P triple vessel bypass 06/09/2016   2009ish  . Suicidal ideation    &  HOMICIDAL IDEATION --  06-16-2013   ADMITTED TO BEHAVIOR HEALTH    Past Surgical History:  Procedure Laterality Date  . AGILE CAPSULE N/A 06/19/2016   Procedure: AGILE CAPSULE;  Surgeon: [name], MD;  Location: [facility] ENDOSCOPY;  Service: Endoscopy;  Laterality: N/A;  . COLONOSCOPY WITH PROPOFOL N/A 06/18/2016   Procedure: COLONOSCOPY WITH PROPOFOL;  Surgeon: [name], MD;  Location: [facility] ENDOSCOPY;  Service: Endoscopy;  Laterality: N/A;  . COLONOSCOPY WITH PROPOFOL N/A 08/12/2016   Procedure: COLONOSCOPY WITH PROPOFOL;  Surgeon: [name], MD;  Location: [facility] ENDOSCOPY;  Service: Endoscopy;  Laterality: N/A;  . CORONARY ANGIOPLASTY  ?   PT UNABLE TO TELL IF  BEFORE OR AFTER  CABG  . CORONARY ARTERY BYPASS GRAFT  2008  ([place] , [place])   3 VESSEL  . DIALYSIS FISTULA CREATION  LAST SURGERY  APPOX  2008  . ENTEROSCOPY N/A 05/10/2016   Procedure: ENTEROSCOPY;  Surgeon: Jerene Bears, MD;  Location: [place];  Service: Gastroenterology;  Laterality: N/A;  . ENTEROSCOPY N/A 08/12/2016   Procedure: ENTEROSCOPY;  Surgeon: [name], MD;  Location: [facility] ENDOSCOPY;  Service: Endoscopy;  Laterality: N/A;  . ESOPHAGOGASTRODUODENOSCOPY N/A 05/07/2015   Procedure: ESOPHAGOGASTRODUODENOSCOPY (EGD);  Surgeon: Hulen Luster,  MD;  Location: [facility] ENDOSCOPY;  Service: Endoscopy;  Laterality: N/A;  . ESOPHAGOGASTRODUODENOSCOPY (EGD) WITH PROPOFOL N/A 05/17/2015   Procedure: ESOPHAGOGASTRODUODENOSCOPY (EGD) WITH PROPOFOL;  Surgeon: [name], MD;  Location: [facility] ENDOSCOPY;  Service: Endoscopy;  Laterality: N/A;  . ESOPHAGOGASTRODUODENOSCOPY (EGD) WITH PROPOFOL N/A 01/20/2016   Procedure: ESOPHAGOGASTRODUODENOSCOPY (EGD) WITH PROPOFOL;  Surgeon: [name], MD;  Location: [facility] ENDOSCOPY;  Service: Endoscopy;  Laterality: N/A;  . ESOPHAGOGASTRODUODENOSCOPY (EGD) WITH PROPOFOL N/A 04/17/2016   Procedure: ESOPHAGOGASTRODUODENOSCOPY (EGD) WITH PROPOFOL;  Surgeon: Lin Landsman, MD;  Location: [facility] ENDOSCOPY;   Service: Gastroenterology;  Laterality: N/A;  . ESOPHAGOGASTRODUODENOSCOPY (EGD) WITH PROPOFOL  05/09/2016   Procedure: ESOPHAGOGASTRODUODENOSCOPY (EGD) WITH PROPOFOL;  Surgeon: Jerene Bears, MD;  Location: [place];  Service: Endoscopy;;  . ESOPHAGOGASTRODUODENOSCOPY (EGD) WITH PROPOFOL N/A 06/16/2016   Procedure: ESOPHAGOGASTRODUODENOSCOPY (EGD) WITH PROPOFOL;  Surgeon: [name], MD;  Location: [facility] ENDOSCOPY;  Service: Endoscopy;  Laterality: N/A;  . GIVENS CAPSULE STUDY N/A 05/07/2016   Procedure: GIVENS CAPSULE STUDY;  Surgeon: Doran Stabler, MD;  Location: [place];  Service: Endoscopy;  Laterality: N/A;  . MANDIBULAR HARDWARE REMOVAL N/A 07/29/2013   Procedure: REMOVAL OF ARCH BARS;  Surgeon: Theodoro Kos, DO;  Location: [place];  Service: Plastics;  Laterality: N/A;  . ORIF MANDIBULAR FRACTURE N/A 06/05/2013   Procedure: REPAIR OF MANDIBULAR FRACTURE x 2 with maxillo-mandibular fixation ;  Surgeon: Theodoro Kos, DO;  Location: [place];  Service: Plastics;  Laterality: N/A;  . PERIPHERAL ARTERIAL STENT GRAFT Left     Prior to Admission medications   Medication Sig Start Date End Date Taking? Authorizing Provider  albuterol (PROVENTIL HFA;VENTOLIN HFA) 108 (90 Base) MCG/ACT inhaler Inhale 4-6 puffs by mouth every 4 hours as needed for wheezing, cough, and/or shortness of breath 11/16/16  Yes Vaughan Basta, MD  aspirin EC 81 MG tablet Take 1 tablet (81 mg total) by mouth daily. 11/16/16  Yes Vaughan Basta, MD  atorvastatin (LIPITOR) 40 MG tablet Take 1 tablet (40 mg total) by mouth daily. 11/16/16  Yes Vaughan Basta, MD  budesonide-formoterol [facility]) 160-4.5 MCG/ACT inhaler Inhale 2 puffs into the lungs daily. 11/16/16  Yes Vaughan Basta, MD  furosemide (LASIX) 80 MG tablet Take 1 tablet (80 mg total) by mouth daily. 11/16/16  Yes Vaughan Basta, MD  gabapentin (NEURONTIN) 300 MG capsule TAKE 1 CAPSULE BY MOUTH EVERY EVENING  AND 1 EXTRA CAP ON DIALYSIS DAYS AS NEEDED FOR NERVE PAIN 08/16/16  Yes [provider]  ipratropium-albuterol (DUONEB) 0.5-2.5 (3) MG/3ML SOLN Take 3 mLs by nebulization every 6 (six) hours as needed. 11/28/16  Yes Wieting, Richard, MD  labetalol (NORMODYNE) 100 MG tablet Take 1 tablet (100 mg total) by mouth daily. 11/16/16  Yes Vaughan Basta, MD  Multiple Vitamins-Minerals-FA (DIALYVITE SUPREME D) 3 MG TABS Take 1 tablet (3 mg total) by mouth daily. 11/28/16  Yes Wieting, Richard, MD  nitroGLYCERIN (NITROSTAT) 0.4 MG SL tablet Place 1 tablet (0.4 mg total) under the tongue every 5 (five) minutes as needed. 04/12/16  Yes Wende Bushy, MD  pantoprazole (PROTONIX) 40 MG tablet Take 1 tablet (40 mg total) by mouth daily. 11/28/16  Yes Wieting, Richard, MD  sevelamer carbonate (RENVELA) 800 MG tablet Take 3 tablets (2,400 mg total) by mouth 3 (three) times daily with meals. 11/16/16  Yes Vaughan Basta, MD  tiotropium (SPIRIVA HANDIHALER) 18 MCG inhalation capsule Place 1 capsule (18 mcg total) into inhaler and inhale daily. 11/16/16 11/16/17 Yes Vaughan Basta, MD  predniSONE (DELTASONE) 10 MG tablet 4 tabs daily for 4 days Patient not taking: Reported on 12/22/2016 11/28/16   Loletha Grayer, MD    Family History  Problem Relation Age of Onset  . Colon cancer Mother   . Cancer Father   . Cancer Sister   . Kidney disease Brother      Social History  Substance Use Topics  . Smoking status: Current Every Day Smoker    Packs/day: 0.15    Years: 40.00    Types: Cigarettes  . Smokeless tobacco: Never Used  . Alcohol use No     Comment: pt reports quitting after learning about cirrhosis    Allergies as of 12/22/2016  . (No Known Allergies)    Review of Systems:    All systems reviewed and negative except where noted in HPI.   Physical Exam:  Vital signs in last 24 hours: Temp:  [97.7 F (36.5 C)-99.3 F (37.4 C)] 97.8 F (36.6 C) (10/12 1350) Pulse Rate:   [74-88] 81 (10/12 1427) Resp:  [16-18] 18 (10/12 1050) BP: (109-130)/(63-84) 127/63 (10/12 1427) SpO2:  [97 %-100 %] 97 % (10/12 1427) Weight:  [182 lb 8 oz (82.8 kg)-185 lb 3 oz (84 kg)] 185 lb 3 oz (84 kg) (10/12 1050) Last BM Date: 12/22/16 General:   Pleasant, cooperative in NAD Head:  Normocephalic and atraumatic. Eyes:   No icterus.   Conjunctiva pink. PERRLA. Ears:  Normal auditory acuity. Neck:  Supple; no masses or thyroidomegaly Lungs: Respirations even and unlabored. Lungs clear to auscultation bilaterally.   No wheezes, crackles, or rhonchi.  Heart:  Regular rate and rhythm;  Without murmur, clicks, rubs or gallops Abdomen:  Soft, nondistended, nontender. Normal bowel sounds. No appreciable masses or hepatomegaly.  No rebound or guarding.  Rectal:  Not performed. Msk:  Symmetrical without gross deformities.   Extremities:  Without edema, cyanosis or clubbing. Neurologic:  Alert and oriented x3;  grossly normal neurologically. Skin:  Intact without significant lesions or rashes. Cervical Nodes:  No significant cervical adenopathy. Psych:  Alert and cooperative. Normal affect.  LAB RESULTS:  Recent Labs  12/22/16 1201 12/23/16 0313 12/23/16 1511  WBC 5.5 5.4  --   HGB 8.2* 6.8* 7.7*  HCT 27.3* 22.5* 25.3*  PLT 250 232  --    BMET  Recent Labs  12/21/16 0612 12/22/16 1201 12/23/16 0313  NA 139 137 138  K 5.2* 5.7* 6.1*  CL 100* 93* 101  CO2 28 27 27   GLUCOSE 110* 96 109*  BUN 41* 50* 60*  CREATININE 6.75* 8.45* 9.12*  CALCIUM 8.3* 8.7* 7.9*   LFT No results for input(s): PROT, ALBUMIN, AST, ALT, ALKPHOS, BILITOT, BILIDIR, IBILI in the last 72 hours. PT/INR  Recent Labs  12/22/16 1858  LABPROT 14.6  INR 1.15    STUDIES: No results found.    Impression / Plan:   Stanley [name] is a 56 y.o. y/o male who has a history of chronic anemia and renal failure on hemodialysis. The patient has had multiple extensive workups for his anemia with  repeated EGDs and colonoscopies.  The patient has also had a capsule endoscopy in the past.  No sources of a found for his anemia and he has had a polyp found on his last colonoscopy.  The patient has been recommended to undergo a double balloon enteroscopy in [facility] but has never followed through with having his small bowel examined.  I do not believe that a repeat EGD or  colonoscopy will will give [name] any more information and if the patient should start to bleed heavily then a red tag cell study may delineate the approximate area of his bleeding.  Otherwise I would treat his bleeding symptomatically with transfusions and iron.  Thank you for involving me in the care of this patient.      LOS: 1 day   [name], MD  12/23/2016, 6:38 PM   Note: This dictation was prepared with Dragon dictation along with smaller phrase technology. Any transcriptional errors that result from this process are unintentional.

## 2016-12-23 NOTE — Progress Notes (Signed)
[facility]  ROUNDING NOTE   Subjective:  Patient left AMA on 12/21/2016. He subsequently returned 1 day later for readmission. He missed hemodialysis yesterday. He is now seen and evaluated during dialysis and appears to be tolerating well.   Objective:  Vital signs in last 24 hours:  Temp:  [97.7 F (36.5 C)-99.3 F (37.4 C)] 97.7 F (36.5 C) (10/12 1210) Pulse Rate:  [74-88] 78 (10/12 1210) Resp:  [16-18] 18 (10/12 1050) BP: (114-134)/(67-86) 123/77 (10/12 1210) SpO2:  [97 %-100 %] 99 % (10/12 1050) Weight:  [82.8 kg (182 lb 8 oz)-84 kg (185 lb 3 oz)] 84 kg (185 lb 3 oz) (10/12 1050)  Weight change:  Filed Weights   12/22/16 1202 12/23/16 0545 12/23/16 1050  Weight: 88.5 kg (195 lb) 82.8 kg (182 lb 8 oz) 84 kg (185 lb 3 oz)    Intake/Output: I/O last 3 completed shifts: In: 35 [P.O.:60] Out: -    Intake/Output this shift:  Total I/O In: 480 [P.O.:480] Out: -   Physical Exam: General: No acute distress  Head: Normocephalic, atraumatic. Moist oral mucosal membranes  Eyes: Anicteric  Neck: Supple, trachea midline  Lungs:  Clear to auscultation, normal effort  Heart: S1S2 no rubs  Abdomen:  Distension noted, BS present  Extremities: trace peripheral edema.  Neurologic: Awake, alert, following commands  Skin: No lesions  Access: RUE AVF    Basic Metabolic Panel:  Recent Labs Lab 12/20/16 1235 12/21/16 0612 12/22/16 1201 12/23/16 0313 12/23/16 1056  NA 140 139 137 138  --   K 6.3* 5.2* 5.7* 6.1*  --   CL 98* 100* 93* 101  --   CO2 25 28 27 27   --   GLUCOSE 98 110* 96 109*  --   BUN 65* 41* 50* 60*  --   CREATININE 9.82* 6.75* 8.45* 9.12*  --   CALCIUM 8.7* 8.3* 8.7* 7.9*  --   PHOS  --   --   --   --  8.1*    Liver Function Tests: No results for input(s): AST, ALT, ALKPHOS, BILITOT, PROT, ALBUMIN in the last 168 hours. No results for input(s): LIPASE, AMYLASE in the last 168 hours. No results for input(s): AMMONIA in the last 168  hours.  CBC:  Recent Labs Lab 12/20/16 1235 12/21/16 0612 12/22/16 1201 12/23/16 0313  WBC 8.3  --  5.5 5.4  HGB 7.7* 6.6* 8.2* 6.8*  HCT 25.0*  --  27.3* 22.5*  MCV 78.1*  --  76.8* 76.9*  PLT 236  --  250 232    Cardiac Enzymes:  Recent Labs Lab 12/20/16 1236  TROPONINI 0.11*    BNP: Invalid input(s): POCBNP  CBG:  Recent Labs Lab 12/20/16 2042 12/21/16 0722  GLUCAP 85 109*    Microbiology: Results for orders placed or performed during the hospital encounter of 11/26/16  MRSA PCR Screening     Status: None   Collection Time: 11/26/16  3:30 PM  Result Value Ref Range Status   MRSA by PCR NEGATIVE NEGATIVE Final    Comment:        The GeneXpert MRSA Assay (FDA approved for NASAL specimens only), is one component of a comprehensive MRSA colonization surveillance program. It is not intended to diagnose MRSA infection nor to guide or monitor treatment for MRSA infections.     Coagulation Studies:  Recent Labs  12/20/16 1440 12/22/16 1858  LABPROT 14.0 14.6  INR 1.09 1.15    Urinalysis: No results for  input(s): COLORURINE, LABSPEC, PHURINE, GLUCOSEU, HGBUR, BILIRUBINUR, KETONESUR, PROTEINUR, UROBILINOGEN, NITRITE, LEUKOCYTESUR in the last 72 hours.  Invalid input(s): APPERANCEUR    Imaging: No results found.   Medications:   . sodium chloride    . sodium chloride    . sodium chloride    . sodium chloride     . atorvastatin  40 mg Oral Daily  . furosemide  80 mg Oral Daily  . gabapentin  100 mg Oral QHS  . labetalol  100 mg Oral Daily  . mometasone-formoterol  2 puff Inhalation BID  . pantoprazole  40 mg Oral BID AC  . sevelamer carbonate  2,400 mg Oral TID WC  . sodium chloride flush  3 mL Intravenous Q12H  . sodium chloride flush  3 mL Intravenous Q12H  . tiotropium  18 mcg Inhalation Daily   sodium chloride, sodium chloride, sodium chloride, acetaminophen **OR** acetaminophen, albuterol, alteplase, alum & mag  hydroxide-simeth, heparin, lidocaine (PF), lidocaine-prilocaine, nitroGLYCERIN, ondansetron **OR** ondansetron (ZOFRAN) IV, pentafluoroprop-tetrafluoroeth, polyethylene glycol, sodium chloride flush, traMADol  Assessment/ Plan:  56 y.o. male with end stage renal disease on hemodialysis secondary to Alport's syndrome, end stage liver disease with hepatic cirrhosis and ascites, hypertension, anemia of chronic kidney disease, coronary artery disease, peripheral vascular disease, hyperlipidemia, gastrointestinal AVMs  [facility] [place] AVF  1. End-stage renal disease. 2. Hyperkalemia 3. Hypertension 4. Anemia of chronic kidney disease, anemia secondary to GI bleed as well. 5. Ascites secondary to end-stage liver disease and cirrhosis. 6. Secondary hyperparathyroidism.  Plan:  Patient seen and evaluated during hemodialysis. He appears to be tolerating well.  Potassium noted to be high again at 6.1. This should come down with hemodialysis today. Continue to monitor serum potassium. Hemoglobin down to 6.8 again. Consider blood transfusion but defer to hospitalist.We will continue to monitor his progress along with you.    LOS: 1 Trace Cederberg 10/12/201812:18 PM

## 2016-12-24 LAB — CBC
HCT: 23.5 % — ABNORMAL LOW (ref 40.0–52.0)
Hemoglobin: 7.3 g/dL — ABNORMAL LOW (ref 13.0–18.0)
MCH: 24.4 pg — ABNORMAL LOW (ref 26.0–34.0)
MCHC: 31 g/dL — ABNORMAL LOW (ref 32.0–36.0)
MCV: 78.9 fL — ABNORMAL LOW (ref 80.0–100.0)
PLATELETS: 227 10*3/uL (ref 150–440)
RBC: 2.98 MIL/uL — AB (ref 4.40–5.90)
RDW: 22.8 % — AB (ref 11.5–14.5)
WBC: 6.1 10*3/uL (ref 3.8–10.6)

## 2016-12-24 LAB — PREPARE RBC (CROSSMATCH)

## 2016-12-24 LAB — PHOSPHORUS: PHOSPHORUS: 5.9 mg/dL — AB (ref 2.5–4.6)

## 2016-12-24 LAB — PARATHYROID HORMONE, INTACT (NO CA): PTH: 443 pg/mL — AB (ref 15–65)

## 2016-12-24 MED ORDER — SODIUM CHLORIDE 0.9 % IV SOLN
Freq: Once | INTRAVENOUS | Status: DC
Start: 2016-12-24 — End: 2016-12-24

## 2016-12-24 NOTE — Progress Notes (Signed)
This note also relates to the following rows which could not be included: Pulse Rate - Cannot attach notes to unvalidated device data BP - Cannot attach notes to unvalidated device data  HD COMPLETED

## 2016-12-24 NOTE — Progress Notes (Signed)
[facility]  ROUNDING NOTE   Subjective:  Late entry.   Patient seen earlier in the day. He has completed hemodialysis. Case also discussed with gastroenterology today.   Objective:  Vital signs in last 24 hours:  Temp:  [97.6 F (36.4 C)-98.4 F (36.9 C)] 98.2 F (36.8 C) (10/13 1345) Pulse Rate:  [69-87] 69 (10/13 1358) Resp:  [13-20] 14 (10/13 1315) BP: (107-129)/(56-95) 129/78 (10/13 1345) SpO2:  [99 %-100 %] 100 % (10/13 1200) Weight:  [81.1 kg (178 lb 12.7 oz)] 81.1 kg (178 lb 12.7 oz) (10/13 0433)  Weight change: -4.451 kg (-9 lb 13 oz) Filed Weights   12/23/16 0545 12/23/16 1050 12/24/16 0433  Weight: 82.8 kg (182 lb 8 oz) 84 kg (185 lb 3 oz) 81.1 kg (178 lb 12.7 oz)    Intake/Output: I/O last 3 completed shifts: In: 96 [P.O.:760] Out: 7169 [Other:1730]   Intake/Output this shift:  Total I/O In: 568 [P.O.:240; Blood:328] Out: 1500 [Other:1500]  Physical Exam: General: No acute distress  Head: Normocephalic, atraumatic. Moist oral mucosal membranes  Eyes: Anicteric  Neck: Supple, trachea midline  Lungs:  Clear to auscultation, normal effort  Heart: S1S2 no rubs  Abdomen:  Distension noted, BS present  Extremities: trace peripheral edema.  Neurologic: Awake, alert, following commands  Skin: No lesions  Access: RUE AVF    Basic Metabolic Panel:  Recent Labs Lab 12/20/16 1235 12/21/16 0612 12/22/16 1201 12/23/16 0313 12/23/16 1056 12/24/16 1045  NA 140 139 137 138  --   --   K 6.3* 5.2* 5.7* 6.1*  --   --   CL 98* 100* 93* 101  --   --   CO2 25 28 27 27   --   --   GLUCOSE 98 110* 96 109*  --   --   BUN 65* 41* 50* 60*  --   --   CREATININE 9.82* 6.75* 8.45* 9.12*  --   --   CALCIUM 8.7* 8.3* 8.7* 7.9*  --   --   PHOS  --   --   --   --  8.1* 5.9*    Liver Function Tests: No results for input(s): AST, ALT, ALKPHOS, BILITOT, PROT, ALBUMIN in the last 168 hours. No results for input(s): LIPASE, AMYLASE in the last 168  hours. No results for input(s): AMMONIA in the last 168 hours.  CBC:  Recent Labs Lab 12/20/16 1235 12/21/16 0612 12/22/16 1201 12/23/16 0313 12/23/16 1511 12/24/16 0334  WBC 8.3  --  5.5 5.4  --  6.1  HGB 7.7* 6.6* 8.2* 6.8* 7.7* 7.3*  HCT 25.0*  --  27.3* 22.5* 25.3* 23.5*  MCV 78.1*  --  76.8* 76.9*  --  78.9*  PLT 236  --  250 232  --  227    Cardiac Enzymes:  Recent Labs Lab 12/20/16 1236  TROPONINI 0.11*    BNP: Invalid input(s): POCBNP  CBG:  Recent Labs Lab 12/20/16 2042 12/21/16 0722  GLUCAP 2 109*    Microbiology: Results for orders placed or performed during the hospital encounter of 11/26/16  MRSA PCR Screening     Status: None   Collection Time: 11/26/16  3:30 PM  Result Value Ref Range Status   MRSA by PCR NEGATIVE NEGATIVE Final    Comment:        The GeneXpert MRSA Assay (FDA approved for NASAL specimens only), is one component of a comprehensive MRSA colonization surveillance program. It is not intended to diagnose MRSA  infection nor to guide or monitor treatment for MRSA infections.     Coagulation Studies:  Recent Labs  12/22/16 1858  LABPROT 14.6  INR 1.15    Urinalysis: No results for input(s): COLORURINE, LABSPEC, PHURINE, GLUCOSEU, HGBUR, BILIRUBINUR, KETONESUR, PROTEINUR, UROBILINOGEN, NITRITE, LEUKOCYTESUR in the last 72 hours.  Invalid input(s): APPERANCEUR    Imaging: No results found.   Medications:   . sodium chloride    . sodium chloride    . sodium chloride     . atorvastatin  40 mg Oral Daily  . furosemide  80 mg Oral Daily  . gabapentin  100 mg Oral QHS  . labetalol  100 mg Oral Daily  . mometasone-formoterol  2 puff Inhalation BID  . pantoprazole  40 mg Oral BID AC  . sevelamer carbonate  2,400 mg Oral TID WC  . sodium chloride flush  3 mL Intravenous Q12H  . sodium chloride flush  3 mL Intravenous Q12H  . tiotropium  18 mcg Inhalation Daily   sodium chloride, acetaminophen **OR**  acetaminophen, albuterol, alum & mag hydroxide-simeth, nitroGLYCERIN, ondansetron **OR** ondansetron (ZOFRAN) IV, polyethylene glycol, sodium chloride flush, traMADol  Assessment/ Plan:  56 y.o. male with end stage renal disease on hemodialysis secondary to Alport's syndrome, end stage liver disease with hepatic cirrhosis and ascites, hypertension, anemia of chronic kidney disease, coronary artery disease, peripheral vascular disease, hyperlipidemia, gastrointestinal AVMs  [facility] AVF  1. End-stage renal disease. 2. Hyperkalemia 3. Hypertension 4. Anemia of chronic kidney disease, anemia secondary to GI bleed as well. 5. Ascites secondary to end-stage liver disease and cirrhosis. 6. Secondary hyperparathyroidism.  Plan:  Patient did complete hemodialysis today. He tolerated the treatment quite well.  Ultrafiltration achieved was 1.5 kg. Hemoglobin was 7.3 most recently. He continues to sign out AMA which occurred earlier in the week as well. He was advised by gastroneurology that he needs to follow-up with [facility] for double balloon enteroscopy    LOS: 2 [place] S[place] 10/13/20182:42 PM

## 2016-12-24 NOTE — Progress Notes (Signed)
HD STARTED  

## 2016-12-24 NOTE — Progress Notes (Signed)
Pt returned from HD. Refused to let staff check VS and wanted to leave AMA. I informed pt he had an active discharge order and that I could print his paperwork shortly but he refused to wait and signed out AMA. MD notified

## 2016-12-24 NOTE — Progress Notes (Signed)
PRE DIALYSIS ASSESSMENT 

## 2016-12-24 NOTE — Progress Notes (Signed)
POST DIALYSIS ASSESSMENT 

## 2016-12-25 LAB — TYPE AND SCREEN
ABO/RH(D): A POS
Antibody Screen: NEGATIVE
UNIT DIVISION: 0
UNIT DIVISION: 0

## 2016-12-25 LAB — BPAM RBC
Blood Product Expiration Date: 201810212359
Blood Product Expiration Date: 201810272359
ISSUE DATE / TIME: 201810121142
ISSUE DATE / TIME: 201810131128
Unit Type and Rh: 5100
Unit Type and Rh: 6200

## 2016-12-27 DIAGNOSIS — D649 Anemia, unspecified: Secondary | ICD-10-CM | POA: Diagnosis not present

## 2016-12-27 DIAGNOSIS — N2581 Secondary hyperparathyroidism of renal origin: Secondary | ICD-10-CM | POA: Diagnosis not present

## 2016-12-27 DIAGNOSIS — D509 Iron deficiency anemia, unspecified: Secondary | ICD-10-CM | POA: Diagnosis not present

## 2016-12-27 DIAGNOSIS — N186 End stage renal disease: Secondary | ICD-10-CM | POA: Diagnosis not present

## 2016-12-27 NOTE — Discharge Summary (Signed)
[facility] [place] NAME: Stanley [name]    MR#:  [id number]  DATE OF BIRTH:  1960-10-21  DATE OF ADMISSION:  12/22/2016 ADMITTING PHYSICIAN: Hillary Bow, MD  DATE OF DISCHARGE: 12/24/2016  2:58 PM  PRIMARY CARE PHYSICIAN: Alene Mires Elyse Jarvis, MD   ADMISSION DIAGNOSIS:  Hyperkalemia [E87.5] Gastrointestinal hemorrhage, unspecified gastrointestinal hemorrhage type [K92.2]  DISCHARGE DIAGNOSIS:  Active Problems:   Anemia   SECONDARY DIAGNOSIS:   Past Medical History:  Diagnosis Date  . Alcohol abuse   . CHF (congestive heart failure) ([place])   . Cirrhosis ([place])   . Coronary artery disease 2009  . Diabetic peripheral neuropathy ([place])   . Drug abuse ([place])   . End stage renal disease on dialysis [facility]) NEPHROLOGIST-   DR [facility]  IN [place]   HEMODIALYSIS --   TUES/  THURS/  SAT  . GERD (gastroesophageal reflux disease)   . Hyperlipidemia   . Hypertension   . PAD (peripheral artery disease) ([place])   . Renal insufficiency    Per pt, 32 oz fluid restriction per day  . S/P triple vessel bypass 06/09/2016   2009ish  . Suicidal ideation    & HOMICIDAL IDEATION --  06-16-2013   ADMITTED TO BEHAVIOR HEALTH     ADMITTING HISTORY  HISTORY OF PRESENT ILLNESS:  Stanley [name]  is a 56 y.o. male with a known history of end-stage renal disease on hemodialysis, chronic anemia, small bowel angioectasia with chronic GI blood loss, noncompliance, polysubstance abuse presents to the emergency room complaining of weakness from the dialysis center. Patient was here in the hospital recently and left [place] yesterday which he normally does. His hemoglobin was found to be 6.6 yesterday and was supposed to get transfusion. He tells me he left because his ride would leave without him. Today in the emergency room his stool is black and positive for occult blood. Hemoglobin is 8.2. Patient feels weak. He is also due for dialysis with  potassium of 5.7 and is being admitted for inpatient hemodialysis and GI workup. No pain.   HOSPITAL COURSE:   * End-stage renal disease with hyperkalemia. This is due to noncompliance with his hemodialysis. Patient had hemodialysis in the hospital and resolved. Nephrology saw the patient in the hospital.  * Acute on chronic anemia due to chronic GI blood losses. Patient has had multiple EGDs/colonoscopies. Discussed with Dr. wall of GI. He needs a double balloon enteroscopy. He is supposed to follow-up with Dr. Ivor Messier at [facility]. Transfused 1 unit packed RBC. Hemoglobin stable.  On the day of discharge patient returned from dialysis. He did not wait for his discharge papers and left the hospital. He will likely return due to worsening condition it is noncompliance. High risk for readmission.  CONSULTS OBTAINED:  Treatment Team:  Anthonette Legato, MD [name], MD  DRUG ALLERGIES:  No Known Allergies  DISCHARGE MEDICATIONS:   Discharge Medication List as of 12/24/2016  2:59 PM    CONTINUE these medications which have NOT CHANGED   Details  albuterol (PROVENTIL HFA;VENTOLIN HFA) 108 (90 Base) MCG/ACT inhaler Inhale 4-6 puffs by mouth every 4 hours as needed for wheezing, cough, and/or shortness of breath, Print    aspirin EC 81 MG tablet Take 1 tablet (81 mg total) by mouth daily., Starting Wed 11/16/2016, Print    atorvastatin (LIPITOR) 40 MG tablet Take 1 tablet (40 mg total) by mouth daily., Starting Wed 11/16/2016, Print  budesonide-formoterol (SYMBICORT) 160-4.5 MCG/ACT inhaler Inhale 2 puffs into the lungs daily., Starting Wed 11/16/2016, Print    furosemide (LASIX) 80 MG tablet Take 1 tablet (80 mg total) by mouth daily., Starting Wed 11/16/2016, Print    gabapentin (NEURONTIN) 300 MG capsule TAKE 1 CAPSULE BY MOUTH EVERY EVENING AND 1 EXTRA CAP ON DIALYSIS DAYS AS NEEDED FOR NERVE PAIN, Historical Med    ipratropium-albuterol (DUONEB) 0.5-2.5 (3) MG/3ML SOLN Take 3 mLs by  nebulization every 6 (six) hours as needed., Starting Mon 11/28/2016, Print    labetalol (NORMODYNE) 100 MG tablet Take 1 tablet (100 mg total) by mouth daily., Starting Wed 11/16/2016, Print    Multiple Vitamins-Minerals-FA (DIALYVITE SUPREME D) 3 MG TABS Take 1 tablet (3 mg total) by mouth daily., Starting Mon 11/28/2016, Print    nitroGLYCERIN (NITROSTAT) 0.4 MG SL tablet Place 1 tablet (0.4 mg total) under the tongue every 5 (five) minutes as needed., Starting Tue 04/12/2016, Normal    pantoprazole (PROTONIX) 40 MG tablet Take 1 tablet (40 mg total) by mouth daily., Starting Mon 11/28/2016, Print    sevelamer carbonate (RENVELA) 800 MG tablet Take 3 tablets (2,400 mg total) by mouth 3 (three) times daily with meals., Starting Wed 11/16/2016, Print    tiotropium (SPIRIVA HANDIHALER) 18 MCG inhalation capsule Place 1 capsule (18 mcg total) into inhaler and inhale daily., Starting Wed 11/16/2016, Until Thu 11/16/2017, Print      STOP taking these medications     predniSONE (DELTASONE) 10 MG tablet         Today   VITAL SIGNS:  Blood pressure 129/78, pulse 69, temperature 98.2 F (36.8 C), temperature source Oral, resp. rate 14, height 6\' 3"  (1.905 m), weight 81.1 kg (178 lb 12.7 oz), SpO2 100 %.  I/O:  No intake or output data in the 24 hours ending 12/27/16 1258  PHYSICAL EXAMINATION:  Physical Exam  GENERAL:  56 y.o.-year-old patient lying in the bed with no acute distress.  LUNGS: Normal breath sounds bilaterally, no wheezing, rales,rhonchi or crepitation. No use of accessory muscles of respiration.  CARDIOVASCULAR: S1, S2 normal. No murmurs, rubs, or gallops.  ABDOMEN: Soft, non-tender, non-distended. Bowel sounds present. No organomegaly or mass.  NEUROLOGIC: Moves all 4 extremities. PSYCHIATRIC: The patient is alert and oriented x 3.  SKIN: No obvious rash, lesion, or ulcer.   DATA REVIEW:   CBC  Recent Labs Lab 12/24/16 0334  WBC 6.1  HGB 7.3*  HCT 23.5*  PLT 227     Chemistries   Recent Labs Lab 12/23/16 0313  NA 138  K 6.1*  CL 101  CO2 27  GLUCOSE 109*  BUN 60*  CREATININE 9.12*  CALCIUM 7.9*    Cardiac Enzymes No results for input(s): TROPONINI in the last 168 hours.  Microbiology Results  Results for orders placed or performed during the hospital encounter of 11/26/16  MRSA PCR Screening     Status: None   Collection Time: 11/26/16  3:30 PM  Result Value Ref Range Status   MRSA by PCR NEGATIVE NEGATIVE Final    Comment:        The GeneXpert MRSA Assay (FDA approved for NASAL specimens only), is one component of a comprehensive MRSA colonization surveillance program. It is not intended to diagnose MRSA infection nor to guide or monitor treatment for MRSA infections.     RADIOLOGY:  No results found.  Follow up with PCP in 1 week.  Management plans discussed with the patient, family and they  are in agreement.  CODE STATUS:  Code Status History    Date Active Date Inactive Code Status Order ID Comments User Context   12/22/2016  2:31 PM 12/24/2016  6:04 PM Full Code [id number]  Hillary Bow, MD ED   12/20/2016  8:20 PM 12/21/2016  6:53 PM Full Code [id number]  Demetrios Loll, MD Inpatient   11/26/2016 10:30 AM 11/28/2016  3:22 PM Full Code [id number]  Henreitta Leber, MD Inpatient   11/15/2016  7:30 PM 11/16/2016  6:22 PM Full Code [id number]  Fritzi Mandes, MD Inpatient   10/29/2016  2:53 PM 10/30/2016  3:06 PM Full Code [id number]  Loletha Grayer, MD ED   09/15/2016  5:57 PM 09/16/2016  6:17 PM Full Code [id number]  Dustin Flock, MD ED   08/16/2016  9:45 AM 08/18/2016  6:16 PM Full Code [id number]  Hillary Bow, MD ED   08/09/2016  4:59 PM 08/12/2016  8:34 PM Full Code [id number]  Fritzi Mandes, MD Inpatient   07/03/2016  9:54 PM 07/05/2016  9:22 PM Full Code [id number]  Demetrios Loll, MD Inpatient   06/24/2016  4:23 AM 06/26/2016  3:46 PM Full Code [id number]  Saundra Shelling, MD Inpatient   06/14/2016  2:53 PM 06/19/2016  9:04 PM Full Code [id number]   Hillary Bow, MD ED   05/31/2016  6:42 AM 06/01/2016  9:30 PM Full Code [id number]  Saundra Shelling, MD Inpatient   05/05/2016 11:09 PM 05/10/2016  9:57 PM Full Code [id number]  Vianne Bulls, MD Inpatient   04/16/2016  3:56 AM 04/17/2016  6:27 PM Full Code [id number]  Saundra Shelling, MD Inpatient   01/19/2016  3:13 PM 01/22/2016  2:14 PM Full Code [id number]  Loletha Grayer, MD ED   09/24/2015 10:33 AM 09/25/2015  1:31 PM Full Code [id number]  Loletha Grayer, MD ED   05/16/2015  2:17 PM 05/17/2015  8:57 PM Full Code [id number]  Idelle Crouch, MD Inpatient   04/30/2015  8:08 PM 05/08/2015  7:40 PM Full Code [id number]  Vaughan Basta, MD Inpatient   04/06/2015  5:07 AM 04/07/2015  5:36 PM Full Code [id number]  Lance Coon, MD Inpatient   03/23/2015 11:07 AM 03/27/2015 12:46 PM Full Code [id number]  Bettey Costa, MD Inpatient   06/19/2013 11:32 AM 06/21/2013  5:49 PM Full Code [id number]  Cristal Ford, DO Inpatient   06/16/2013  5:26 PM 06/19/2013 11:32 AM Full Code [id number]  Margarita Mail, PA-C ED   06/05/2013  3:33 PM 06/06/2013  6:56 PM Full Code [id number]  Theodoro Kos, DO Inpatient   06/02/2013  9:01 AM 06/05/2013  3:33 PM Full Code [id number]  Velvet Bathe, MD Inpatient   06/02/2013  8:40 AM 06/02/2013  9:01 AM Full Code [id number]  Velvet Bathe, MD Inpatient      TOTAL TIME TAKING CARE OF THIS PATIENT ON DAY OF DISCHARGE: more than 30 minutes.   Hillary Bow R M.D on 12/27/2016 at 12:58 PM  Between 7am to 6pm - Pager - [phone]  After 6pm go to [email or web address] - password [email or web address] [facility]  Office  [phone]  CC: Primary care physician; [name], MD  Note: This dictation was prepared with Dragon dictation along with smaller phrase technology. Any transcriptional errors that result from this process are unintentional.

## 2016-12-31 DIAGNOSIS — D509 Iron deficiency anemia, unspecified: Secondary | ICD-10-CM | POA: Diagnosis not present

## 2016-12-31 DIAGNOSIS — N186 End stage renal disease: Secondary | ICD-10-CM | POA: Diagnosis not present

## 2016-12-31 DIAGNOSIS — D649 Anemia, unspecified: Secondary | ICD-10-CM | POA: Diagnosis not present

## 2016-12-31 DIAGNOSIS — N2581 Secondary hyperparathyroidism of renal origin: Secondary | ICD-10-CM | POA: Diagnosis not present

## 2017-01-03 DIAGNOSIS — D649 Anemia, unspecified: Secondary | ICD-10-CM | POA: Diagnosis not present

## 2017-01-03 DIAGNOSIS — D509 Iron deficiency anemia, unspecified: Secondary | ICD-10-CM | POA: Diagnosis not present

## 2017-01-03 DIAGNOSIS — N186 End stage renal disease: Secondary | ICD-10-CM | POA: Diagnosis not present

## 2017-01-03 DIAGNOSIS — N2581 Secondary hyperparathyroidism of renal origin: Secondary | ICD-10-CM | POA: Diagnosis not present

## 2017-01-05 ENCOUNTER — Emergency Department
Admission: EM | Admit: 2017-01-05 | Discharge: 2017-01-05 | Disposition: A | Discharge status: Home / Self Care | Payer: Medicare Other | Source: Home / Self Care | Attending: Emergency Medicine | Admitting: Emergency Medicine

## 2017-01-05 ENCOUNTER — Encounter: Payer: Self-pay | Admitting: Emergency Medicine

## 2017-01-05 DIAGNOSIS — K7031 Alcoholic cirrhosis of liver with ascites: Secondary | ICD-10-CM | POA: Diagnosis not present

## 2017-01-05 DIAGNOSIS — Z7982 Long term (current) use of aspirin: Secondary | ICD-10-CM | POA: Insufficient documentation

## 2017-01-05 DIAGNOSIS — N186 End stage renal disease: Secondary | ICD-10-CM | POA: Insufficient documentation

## 2017-01-05 DIAGNOSIS — R531 Weakness: Secondary | ICD-10-CM

## 2017-01-05 DIAGNOSIS — F17210 Nicotine dependence, cigarettes, uncomplicated: Secondary | ICD-10-CM

## 2017-01-05 DIAGNOSIS — E1122 Type 2 diabetes mellitus with diabetic chronic kidney disease: Secondary | ICD-10-CM

## 2017-01-05 DIAGNOSIS — I132 Hypertensive heart and chronic kidney disease with heart failure and with stage 5 chronic kidney disease, or end stage renal disease: Secondary | ICD-10-CM

## 2017-01-05 DIAGNOSIS — I2510 Atherosclerotic heart disease of native coronary artery without angina pectoris: Secondary | ICD-10-CM | POA: Insufficient documentation

## 2017-01-05 DIAGNOSIS — Z992 Dependence on renal dialysis: Secondary | ICD-10-CM | POA: Insufficient documentation

## 2017-01-05 DIAGNOSIS — Q8781 Alport syndrome: Secondary | ICD-10-CM | POA: Diagnosis not present

## 2017-01-05 DIAGNOSIS — R0602 Shortness of breath: Secondary | ICD-10-CM | POA: Diagnosis not present

## 2017-01-05 DIAGNOSIS — Z79899 Other long term (current) drug therapy: Secondary | ICD-10-CM | POA: Insufficient documentation

## 2017-01-05 DIAGNOSIS — D62 Acute posthemorrhagic anemia: Secondary | ICD-10-CM | POA: Diagnosis not present

## 2017-01-05 DIAGNOSIS — D649 Anemia, unspecified: Secondary | ICD-10-CM | POA: Diagnosis not present

## 2017-01-05 DIAGNOSIS — I509 Heart failure, unspecified: Secondary | ICD-10-CM

## 2017-01-05 DIAGNOSIS — D509 Iron deficiency anemia, unspecified: Secondary | ICD-10-CM | POA: Diagnosis not present

## 2017-01-05 DIAGNOSIS — K922 Gastrointestinal hemorrhage, unspecified: Secondary | ICD-10-CM | POA: Diagnosis not present

## 2017-01-05 DIAGNOSIS — K625 Hemorrhage of anus and rectum: Secondary | ICD-10-CM | POA: Diagnosis not present

## 2017-01-05 DIAGNOSIS — I120 Hypertensive chronic kidney disease with stage 5 chronic kidney disease or end stage renal disease: Secondary | ICD-10-CM | POA: Diagnosis not present

## 2017-01-05 DIAGNOSIS — N2581 Secondary hyperparathyroidism of renal origin: Secondary | ICD-10-CM | POA: Diagnosis not present

## 2017-01-05 LAB — COMPREHENSIVE METABOLIC PANEL
ALBUMIN: 3.2 g/dL — AB (ref 3.5–5.0)
ALK PHOS: 155 U/L — AB (ref 38–126)
ALT: 21 U/L (ref 17–63)
AST: 30 U/L (ref 15–41)
Anion gap: 14 (ref 5–15)
BUN: 29 mg/dL — AB (ref 6–20)
CALCIUM: 8.8 mg/dL — AB (ref 8.9–10.3)
CO2: 32 mmol/L (ref 22–32)
CREATININE: 6.07 mg/dL — AB (ref 0.61–1.24)
Chloride: 94 mmol/L — ABNORMAL LOW (ref 101–111)
GFR calc Af Amer: 11 mL/min — ABNORMAL LOW (ref >60)
GFR calc non Af Amer: 9 mL/min — ABNORMAL LOW (ref >60)
GLUCOSE: 94 mg/dL (ref 65–99)
Potassium: 4.3 mmol/L (ref 3.5–5.1)
SODIUM: 140 mmol/L (ref 135–145)
Total Bilirubin: 0.5 mg/dL (ref 0.3–1.2)
Total Protein: 7.6 g/dL (ref 6.5–8.1)

## 2017-01-05 LAB — CBC WITH DIFFERENTIAL/PLATELET
BASOS PCT: 1 %
Basophils Absolute: 0.1 10*3/uL (ref 0–0.1)
EOS ABS: 0.1 10*3/uL (ref 0–0.7)
Eosinophils Relative: 2 %
HCT: 26.5 % — ABNORMAL LOW (ref 40.0–52.0)
HEMOGLOBIN: 7.9 g/dL — AB (ref 13.0–18.0)
LYMPHS ABS: 0.6 10*3/uL — AB (ref 1.0–3.6)
Lymphocytes Relative: 9 %
MCH: 24.1 pg — ABNORMAL LOW (ref 26.0–34.0)
MCHC: 29.9 g/dL — ABNORMAL LOW (ref 32.0–36.0)
MCV: 80.9 fL (ref 80.0–100.0)
Monocytes Absolute: 0.7 10*3/uL (ref 0.2–1.0)
Monocytes Relative: 11 %
NEUTROS ABS: 5.3 10*3/uL (ref 1.4–6.5)
NEUTROS PCT: 77 %
Platelets: 461 10*3/uL — ABNORMAL HIGH (ref 150–440)
RBC: 3.28 MIL/uL — AB (ref 4.40–5.90)
RDW: 22 % — ABNORMAL HIGH (ref 11.5–14.5)
WBC: 6.9 10*3/uL (ref 3.8–10.6)

## 2017-01-05 LAB — TROPONIN I: Troponin I: 0.05 ng/mL (ref <0.03)

## 2017-01-05 LAB — TYPE AND SCREEN
ABO/RH(D): A POS
ANTIBODY SCREEN: NEGATIVE

## 2017-01-05 NOTE — ED Provider Notes (Signed)
[facility] Emergency Department Provider Note  ____________________________________________   First MD Initiated Contact with Patient 01/05/17 1355     (approximate)  I have reviewed the triage vital signs and the nursing notes.    HISTORY  Chief Complaint Rectal Bleeding   HPI Takota Cahalan is a 56 y.o. male with a history of cirrhosis as well as end-stage renal disease on dialysis who is presenting to the emergency department today for weakness and concern for low hemoglobin. The patient says that he has been having intermittent blood in the stool over the past 2 years. Said that he had blood on them for the paper today. Was feeling very weak at dialysis insists on coming to the emergency department. He had half of his treatment today and was discontinued but his catheters are still in his fistula at this time. He is not claiming any pain.   Past Medical History:  Diagnosis Date  . Alcohol abuse   . CHF (congestive heart failure) ([place])   . Cirrhosis ([place])   . Coronary artery disease 2009  . Diabetic peripheral neuropathy ([place])   . Drug abuse ([place])   . End stage renal disease on dialysis [facility]) NEPHROLOGIST-   DR [facility]  IN [place]   HEMODIALYSIS --   TUES/  THURS/  SAT  . GERD (gastroesophageal reflux disease)   . Hyperlipidemia   . Hypertension   . PAD (peripheral artery disease) ([place])   . Renal insufficiency    Per pt, 32 oz fluid restriction per day  . S/P triple vessel bypass 06/09/2016   2009ish  . Suicidal ideation    & HOMICIDAL IDEATION --  06-16-2013   ADMITTED TO BEHAVIOR HEALTH    Patient Active Problem List   Diagnosis Date Noted  . Shortness of breath 11/26/2016  . COPD (chronic obstructive pulmonary disease) ([place]) 10/30/2016  . COPD exacerbation ([place]) 10/29/2016  . Anemia   . Heme positive stool   . Ulceration of intestine   . Benign neoplasm of transverse colon   . Acute gastrointestinal hemorrhage   . Esophageal  candidiasis ([place])   . Angiodysplasia of intestinal tract   . Acute respiratory failure with hypoxia ([place]) 07/03/2016  . GI bleeding 06/24/2016  . Rectal bleeding 06/14/2016  . Anemia of chronic disease 06/01/2016  . MRSA carrier 06/01/2016  . Chronic renal failure 05/23/2016  . Ischemic heart disease 05/23/2016  . Angiodysplasia of small intestine   . Melena   . Small bowel bleed not requiring more than 4 units of blood in 24 hours, ICU, or surgery   . Anemia due to chronic blood loss   . Abdominal pain 05/05/2016  . Acute posthemorrhagic anemia 04/17/2016  . Gastrointestinal bleed 04/17/2016  . History of esophagogastroduodenoscopy (EGD) 04/17/2016  . Elevated troponin 04/17/2016  . Alcohol abuse 04/17/2016  . Upper GI bleed 01/19/2016  . Blood in stool   . Angiodysplasia of stomach and duodenum with hemorrhage   . Gastritis   . Esophagitis, unspecified   . GI bleed 05/16/2015  . Acute GI bleeding   . Symptomatic anemia 04/30/2015  . HTN (hypertension) 04/06/2015  . GERD (gastroesophageal reflux disease) 04/06/2015  . HLD (hyperlipidemia) 04/06/2015  . Dyspnea 04/06/2015  . Cirrhosis of liver with ascites ([place]) 04/06/2015  . Ascites 04/06/2015  . GIB (gastrointestinal bleeding) 03/23/2015  . Homicidal ideation 06/19/2013  . Suicidal intent 06/19/2013  . Homicidal ideations 06/19/2013  . Hyperkalemia 06/16/2013  . Mandible fracture ([place]) 06/05/2013  .  Fracture, mandible ([place]) 06/02/2013  . Coronary atherosclerosis of native coronary artery 06/02/2013  . ESRD on dialysis ([place]) 06/02/2013  . Mandible open fracture ([place]) 06/02/2013    Past Surgical History:  Procedure Laterality Date  . AGILE CAPSULE N/A 06/19/2016   Procedure: AGILE CAPSULE;  Surgeon: [name], MD;  Location: [facility] ENDOSCOPY;  Service: Endoscopy;  Laterality: N/A;  . COLONOSCOPY WITH PROPOFOL N/A 06/18/2016   Procedure: COLONOSCOPY WITH PROPOFOL;  Surgeon: [name], MD;  Location: [facility] ENDOSCOPY;   Service: Endoscopy;  Laterality: N/A;  . COLONOSCOPY WITH PROPOFOL N/A 08/12/2016   Procedure: COLONOSCOPY WITH PROPOFOL;  Surgeon: [name], MD;  Location[facility] ENDOSCOPY;  Service: Endoscopy;  Laterality: N/A;  . CORONARY ANGIOPLASTY  ?   PT UNABLE TO TELL IF  BEFORE OR AFTER  CABG  . CORONARY ARTERY BYPASS GRAFT  2008  ([place] , [place])   3 VESSEL  . DIALYSIS FISTULA CREATION  LAST SURGERY  APPOX  2008  . ENTEROSCOPY N/A 05/10/2016   Procedure: ENTEROSCOPY;  Surgeon: Jerene Bears, MD;  Location: [place];  Service: Gastroenterology;  Laterality: N/A;  . ENTEROSCOPY N/A 08/12/2016   Procedure: ENTEROSCOPY;  Surgeon: [name], MD;  Location: [facility] ENDOSCOPY;  Service: Endoscopy;  Laterality: N/A;  . ESOPHAGOGASTRODUODENOSCOPY N/A 05/07/2015   Procedure: ESOPHAGOGASTRODUODENOSCOPY (EGD);  Surgeon: Hulen Luster, MD;  Location: [facility] ENDOSCOPY;  Service: Endoscopy;  Laterality: N/A;  . ESOPHAGOGASTRODUODENOSCOPY (EGD) WITH PROPOFOL N/A 05/17/2015   Procedure: ESOPHAGOGASTRODUODENOSCOPY (EGD) WITH PROPOFOL;  Surgeon: [name], MD;  Location: [facility] ENDOSCOPY;  Service: Endoscopy;  Laterality: N/A;  . ESOPHAGOGASTRODUODENOSCOPY (EGD) WITH PROPOFOL N/A 01/20/2016   Procedure: ESOPHAGOGASTRODUODENOSCOPY (EGD) WITH PROPOFOL;  Surgeon: [name], MD;  Location: [facility] ENDOSCOPY;  Service: Endoscopy;  Laterality: N/A;  . ESOPHAGOGASTRODUODENOSCOPY (EGD) WITH PROPOFOL N/A 04/17/2016   Procedure: ESOPHAGOGASTRODUODENOSCOPY (EGD) WITH PROPOFOL;  Surgeon: Lin Landsman, MD;  Location: [facility] ENDOSCOPY;  Service: Gastroenterology;  Laterality: N/A;  . ESOPHAGOGASTRODUODENOSCOPY (EGD) WITH PROPOFOL  05/09/2016   Procedure: ESOPHAGOGASTRODUODENOSCOPY (EGD) WITH PROPOFOL;  Surgeon: Jerene Bears, MD;  Location: [place];  Service: Endoscopy;;  . ESOPHAGOGASTRODUODENOSCOPY (EGD) WITH PROPOFOL N/A 06/16/2016   Procedure: ESOPHAGOGASTRODUODENOSCOPY (EGD) WITH PROPOFOL;  Surgeon: [name], MD;  Location: [facility]  ENDOSCOPY;  Service: Endoscopy;  Laterality: N/A;  . GIVENS CAPSULE STUDY N/A 05/07/2016   Procedure: GIVENS CAPSULE STUDY;  Surgeon: Doran Stabler, MD;  Location: [place];  Service: Endoscopy;  Laterality: N/A;  . MANDIBULAR HARDWARE REMOVAL N/A 07/29/2013   Procedure: REMOVAL OF ARCH BARS;  Surgeon: Theodoro Kos, DO;  Location: [place];  Service: Plastics;  Laterality: N/A;  . ORIF MANDIBULAR FRACTURE N/A 06/05/2013   Procedure: REPAIR OF MANDIBULAR FRACTURE x 2 with maxillo-mandibular fixation ;  Surgeon: Theodoro Kos, DO;  Location: [place];  Service: Plastics;  Laterality: N/A;  . PERIPHERAL ARTERIAL STENT GRAFT Left     Prior to Admission medications   Medication Sig Start Date End Date Taking? Authorizing Provider  albuterol (PROVENTIL HFA;VENTOLIN HFA) 108 (90 Base) MCG/ACT inhaler Inhale 4-6 puffs by mouth every 4 hours as needed for wheezing, cough, and/or shortness of breath 11/16/16   Vaughan Basta, MD  aspirin EC 81 MG tablet Take 1 tablet (81 mg total) by mouth daily. 11/16/16   Vaughan Basta, MD  atorvastatin (LIPITOR) 40 MG tablet Take 1 tablet (40 mg total) by mouth daily. 11/16/16   Vaughan Basta, MD  budesonide-formoterol (SYMBICORT) 160-4.5 MCG/ACT inhaler Inhale 2 puffs into the  lungs daily. 11/16/16   Vaughan Basta, MD  furosemide (LASIX) 80 MG tablet Take 1 tablet (80 mg total) by mouth daily. 11/16/16   Vaughan Basta, MD  gabapentin (NEURONTIN) 300 MG capsule TAKE 1 CAPSULE BY MOUTH EVERY EVENING AND 1 EXTRA CAP ON DIALYSIS DAYS AS NEEDED FOR NERVE PAIN 08/16/16   [provider]  ipratropium-albuterol (DUONEB) 0.5-2.5 (3) MG/3ML SOLN Take 3 mLs by nebulization every 6 (six) hours as needed. 11/28/16   Loletha Grayer, MD  labetalol (NORMODYNE) 100 MG tablet Take 1 tablet (100 mg total) by mouth daily. 11/16/16   Vaughan Basta, MD  Multiple Vitamins-Minerals-FA (DIALYVITE SUPREME D) 3 MG TABS Take 1  tablet (3 mg total) by mouth daily. 11/28/16   Loletha Grayer, MD  nitroGLYCERIN (NITROSTAT) 0.4 MG SL tablet Place 1 tablet (0.4 mg total) under the tongue every 5 (five) minutes as needed. 04/12/16   Wende Bushy, MD  pantoprazole (PROTONIX) 40 MG tablet Take 1 tablet (40 mg total) by mouth daily. 11/28/16   Loletha Grayer, MD  sevelamer carbonate (RENVELA) 800 MG tablet Take 3 tablets (2,400 mg total) by mouth 3 (three) times daily with meals. 11/16/16   Vaughan Basta, MD  tiotropium (SPIRIVA HANDIHALER) 18 MCG inhalation capsule Place 1 capsule (18 mcg total) into inhaler and inhale daily. 11/16/16 11/16/17  Vaughan Basta, MD    Allergies Patient has no known allergies.  Family History  Problem Relation Age of Onset  . Colon cancer Mother   . Cancer Father   . Cancer Sister   . Kidney disease Brother     Social History Social History  Substance Use Topics  . Smoking status: Current Every Day Smoker    Packs/day: 0.15    Years: 40.00    Types: Cigarettes  . Smokeless tobacco: Never Used  . Alcohol use No     Comment: pt reports quitting after learning about cirrhosis    Review of Systems  Constitutional: No fever/chills Eyes: No visual changes. ENT: No sore throat. Cardiovascular: Denies chest pain. Respiratory: Denies shortness of breath. Gastrointestinal: No abdominal pain.  No nausea, no vomiting.  No diarrhea.  No constipation. Genitourinary: Negative for dysuria. Musculoskeletal: Negative for back pain. Skin: Negative for rash. Neurological: Negative for headaches, focal weakness or numbness.   ____________________________________________   PHYSICAL EXAM:  VITAL SIGNS: ED Triage Vitals [01/05/17 1352]  Enc Vitals Group     BP      Pulse      Resp      Temp      Temp src      SpO2      Weight      Height      Head Circumference      Peak Flow      Pain Score 0     Pain Loc      Pain Edu?      Excl. in [place]?     Constitutional:  Alert and oriented. Well appearing and in no acute distress. Eyes: Conjunctivae are normal.  Head: Atraumatic. Nose: No congestion/rhinnorhea. Mouth/Throat: Mucous membranes are moist.  Neck: No stridor.   Cardiovascular: Normal rate, regular rhythm. Grossly normal heart sounds. Right upper extremity fistula with dialysis catheter still in place. Respiratory: Normal respiratory effort.  No retractions. Lungs CTAB. Gastrointestinal: Soft and nontender. Abdomen is distended and consistent with ascites. Rectal exam with grossly brown stool which is moderately heme positive on the Hemoccult card. Musculoskeletal: No lower extremity tenderness nor edema.  No joint effusions. Neurologic:  Normal speech and language. No gross focal neurologic deficits are appreciated. Skin:  Skin is warm, dry and intact. No rash noted. Psychiatric: Mood and affect are normal. Speech and behavior are normal.  ____________________________________________   LABS (all labs ordered are listed, but only abnormal results are displayed)  Labs Reviewed  CBC WITH DIFFERENTIAL/PLATELET - Abnormal; Notable for the following:       Result Value   RBC 3.28 (*)    Hemoglobin 7.9 (*)    HCT 26.5 (*)    MCH 24.1 (*)    MCHC 29.9 (*)    RDW 22.0 (*)    Platelets 461 (*)    Lymphs Abs 0.6 (*)    All other components within normal limits  COMPREHENSIVE METABOLIC PANEL - Abnormal; Notable for the following:    Chloride 94 (*)    BUN 29 (*)    Creatinine, Ser 6.07 (*)    Calcium 8.8 (*)    Albumin 3.2 (*)    Alkaline Phosphatase 155 (*)    GFR calc non Af Amer 9 (*)    GFR calc Af Amer 11 (*)    All other components within normal limits  TROPONIN I - Abnormal; Notable for the following:    Troponin I 0.05 (*)    All other components within normal limits  TYPE AND SCREEN   ____________________________________________  EKG  ED ECG REPORT I, Doran Stabler, the attending physician, personally viewed and  interpreted this ECG.   Date: 01/05/2017  EKG Time: 1403  Rate: 83  Rhythm: normal sinus rhythm  Axis: Normal  Intervals:nonspecific intraventricular conduction delay  ST&T Change: No ST segment elevation or depression.  Biphasic T waves in II, III, and F aVF with what appears to be single T-wave inversion in V5. No significant change from previous EKG of 12/25/2016.  ____________________________________________  RADIOLOGY   ____________________________________________   PROCEDURES  Procedure(s) performed:   Procedures  Critical Care performed:   ____________________________________________   INITIAL IMPRESSION / ASSESSMENT AND PLAN / ED COURSE  Pertinent labs & imaging results that were available during my care of the patient were reviewed by me and considered in my medical decision making (see chart for details).  Differential diagnosis includes, but is not limited to, alcohol, illicit or prescription medications, or other toxic ingestion; intracranial pathology such as stroke or intracerebral hemorrhage; fever or infectious causes including sepsis; hypoxemia and/or hypercarbia; uremia; trauma; endocrine related disorders such as diabetes, hypoglycemia, and thyroid-related diseases; hypertensive encephalopathy; etc.  As part of my medical decision making, I reviewed the following data within the electronic MEDICAL RECORD NUMBER Notes from prior ED visits    ----------------------------------------- 2:56 PM on 01/05/2017 -----------------------------------------  Labs appear to be at baseline. Hemoglobin of 7.9. Troponin of 0.05. Patient without any further complaints at this time. No gross blood on his rectal exam. Patient will be discharged from outpatient follow-up at this time. I discussed the lab results as well as the treatment plan and he is understanding and wanted to comply. Patient with normal potassium today after half session of dialysis. Patient to follow up with  dialysis      ____________________________________________   FINAL CLINICAL IMPRESSION(S) / ED DIAGNOSES  Weakness.    NEW MEDICATIONS STARTED DURING THIS VISIT:  New Prescriptions   No medications on file     Note:  This document was prepared using Dragon voice recognition software and may include unintentional dictation errors.  Orbie Pyo, MD 01/05/17 [phone]

## 2017-01-05 NOTE — Progress Notes (Signed)
Advised that this patient arrived to ER with needles in his RUA AVF and requires de-cannulation; ER staff brought pt to acute room in w/c for same; aseptic de-cannulation achieved and pressure dressings placed and CDI and hemostasis achieved w/o prolonged bleeding; pt departed the acute room in stable condition in the care of ER staff

## 2017-01-05 NOTE — ED Notes (Signed)
Date and time results received: 01/05/17 1450 (use smartphrase ".now" to insert current time)  Test: troponin Critical Value: 0.05  Name of Provider Notified: schaevitz

## 2017-01-05 NOTE — ED Notes (Signed)
Pt given peanut butter, crackers and a drink

## 2017-01-05 NOTE — ED Notes (Addendum)
Pt transported to dialysis at this time for de access

## 2017-01-05 NOTE — ED Triage Notes (Signed)
Pt to ED via EMS from dialysis , pt c/o rectal bleed. PT states hx of rectal bleeding x46yr, has had transfusion. Pt received half of his dialysis treatment . Pt VS stable, A&OX4, MD at bedside

## 2017-01-05 NOTE — ED Notes (Signed)
Dialysis notified at this time pt needs to be de accessed

## 2017-01-07 ENCOUNTER — Emergency Department: Payer: Medicare Other

## 2017-01-07 ENCOUNTER — Encounter: Payer: Self-pay | Admitting: Emergency Medicine

## 2017-01-07 ENCOUNTER — Inpatient Hospital Stay
Admission: EM | Admit: 2017-01-07 | Discharge: 2017-01-13 | DRG: 432 | Disposition: A | Discharge status: Home / Self Care | Payer: Medicare Other | Attending: Internal Medicine | Admitting: Internal Medicine

## 2017-01-07 DIAGNOSIS — I2510 Atherosclerotic heart disease of native coronary artery without angina pectoris: Secondary | ICD-10-CM | POA: Diagnosis present

## 2017-01-07 DIAGNOSIS — D500 Iron deficiency anemia secondary to blood loss (chronic): Secondary | ICD-10-CM | POA: Diagnosis present

## 2017-01-07 DIAGNOSIS — E1142 Type 2 diabetes mellitus with diabetic polyneuropathy: Secondary | ICD-10-CM | POA: Diagnosis present

## 2017-01-07 DIAGNOSIS — E1122 Type 2 diabetes mellitus with diabetic chronic kidney disease: Secondary | ICD-10-CM | POA: Diagnosis present

## 2017-01-07 DIAGNOSIS — Z800 Family history of malignant neoplasm of digestive organs: Secondary | ICD-10-CM | POA: Diagnosis not present

## 2017-01-07 DIAGNOSIS — D62 Acute posthemorrhagic anemia: Secondary | ICD-10-CM | POA: Diagnosis present

## 2017-01-07 DIAGNOSIS — N186 End stage renal disease: Secondary | ICD-10-CM | POA: Diagnosis present

## 2017-01-07 DIAGNOSIS — Z9861 Coronary angioplasty status: Secondary | ICD-10-CM | POA: Diagnosis not present

## 2017-01-07 DIAGNOSIS — K7460 Unspecified cirrhosis of liver: Secondary | ICD-10-CM | POA: Diagnosis not present

## 2017-01-07 DIAGNOSIS — Q8781 Alport syndrome: Secondary | ICD-10-CM | POA: Diagnosis not present

## 2017-01-07 DIAGNOSIS — Z992 Dependence on renal dialysis: Secondary | ICD-10-CM | POA: Diagnosis not present

## 2017-01-07 DIAGNOSIS — R0602 Shortness of breath: Secondary | ICD-10-CM | POA: Diagnosis not present

## 2017-01-07 DIAGNOSIS — K7290 Hepatic failure, unspecified without coma: Secondary | ICD-10-CM | POA: Diagnosis present

## 2017-01-07 DIAGNOSIS — Z9115 Patient's noncompliance with renal dialysis: Secondary | ICD-10-CM | POA: Diagnosis not present

## 2017-01-07 DIAGNOSIS — M542 Cervicalgia: Secondary | ICD-10-CM | POA: Diagnosis not present

## 2017-01-07 DIAGNOSIS — K219 Gastro-esophageal reflux disease without esophagitis: Secondary | ICD-10-CM | POA: Diagnosis present

## 2017-01-07 DIAGNOSIS — N2581 Secondary hyperparathyroidism of renal origin: Secondary | ICD-10-CM | POA: Diagnosis present

## 2017-01-07 DIAGNOSIS — D649 Anemia, unspecified: Secondary | ICD-10-CM | POA: Diagnosis not present

## 2017-01-07 DIAGNOSIS — K922 Gastrointestinal hemorrhage, unspecified: Secondary | ICD-10-CM

## 2017-01-07 DIAGNOSIS — Z7982 Long term (current) use of aspirin: Secondary | ICD-10-CM

## 2017-01-07 DIAGNOSIS — F17210 Nicotine dependence, cigarettes, uncomplicated: Secondary | ICD-10-CM | POA: Diagnosis present

## 2017-01-07 DIAGNOSIS — Z9119 Patient's noncompliance with other medical treatment and regimen: Secondary | ICD-10-CM

## 2017-01-07 DIAGNOSIS — E875 Hyperkalemia: Secondary | ICD-10-CM | POA: Diagnosis present

## 2017-01-07 DIAGNOSIS — R109 Unspecified abdominal pain: Secondary | ICD-10-CM

## 2017-01-07 DIAGNOSIS — E785 Hyperlipidemia, unspecified: Secondary | ICD-10-CM | POA: Diagnosis present

## 2017-01-07 DIAGNOSIS — R188 Other ascites: Secondary | ICD-10-CM | POA: Diagnosis present

## 2017-01-07 DIAGNOSIS — R140 Abdominal distension (gaseous): Secondary | ICD-10-CM | POA: Diagnosis not present

## 2017-01-07 DIAGNOSIS — I5033 Acute on chronic diastolic (congestive) heart failure: Secondary | ICD-10-CM | POA: Diagnosis not present

## 2017-01-07 DIAGNOSIS — K567 Ileus, unspecified: Secondary | ICD-10-CM

## 2017-01-07 DIAGNOSIS — K5900 Constipation, unspecified: Secondary | ICD-10-CM

## 2017-01-07 DIAGNOSIS — R52 Pain, unspecified: Secondary | ICD-10-CM

## 2017-01-07 DIAGNOSIS — K7031 Alcoholic cirrhosis of liver with ascites: Secondary | ICD-10-CM | POA: Diagnosis present

## 2017-01-07 DIAGNOSIS — Z841 Family history of disorders of kidney and ureter: Secondary | ICD-10-CM

## 2017-01-07 DIAGNOSIS — I10 Essential (primary) hypertension: Secondary | ICD-10-CM | POA: Diagnosis not present

## 2017-01-07 DIAGNOSIS — Z951 Presence of aortocoronary bypass graft: Secondary | ICD-10-CM

## 2017-01-07 DIAGNOSIS — K921 Melena: Secondary | ICD-10-CM | POA: Diagnosis present

## 2017-01-07 DIAGNOSIS — E1151 Type 2 diabetes mellitus with diabetic peripheral angiopathy without gangrene: Secondary | ICD-10-CM | POA: Diagnosis present

## 2017-01-07 DIAGNOSIS — I120 Hypertensive chronic kidney disease with stage 5 chronic kidney disease or end stage renal disease: Secondary | ICD-10-CM | POA: Diagnosis not present

## 2017-01-07 DIAGNOSIS — I132 Hypertensive heart and chronic kidney disease with heart failure and with stage 5 chronic kidney disease, or end stage renal disease: Secondary | ICD-10-CM | POA: Diagnosis present

## 2017-01-07 DIAGNOSIS — D631 Anemia in chronic kidney disease: Secondary | ICD-10-CM | POA: Diagnosis present

## 2017-01-07 DIAGNOSIS — F17200 Nicotine dependence, unspecified, uncomplicated: Secondary | ICD-10-CM | POA: Diagnosis not present

## 2017-01-07 LAB — CBC WITH DIFFERENTIAL/PLATELET
BASOS ABS: 0.1 10*3/uL (ref 0–0.1)
Basophils Relative: 1 %
EOS PCT: 2 %
Eosinophils Absolute: 0.1 10*3/uL (ref 0–0.7)
HCT: 22.1 % — ABNORMAL LOW (ref 40.0–52.0)
Hemoglobin: 6.6 g/dL — ABNORMAL LOW (ref 13.0–18.0)
LYMPHS PCT: 9 %
Lymphs Abs: 0.7 10*3/uL — ABNORMAL LOW (ref 1.0–3.6)
MCH: 24.3 pg — AB (ref 26.0–34.0)
MCHC: 30 g/dL — AB (ref 32.0–36.0)
MCV: 81 fL (ref 80.0–100.0)
Monocytes Absolute: 0.9 10*3/uL (ref 0.2–1.0)
Monocytes Relative: 12 %
NEUTROS PCT: 76 %
Neutro Abs: 5.9 10*3/uL (ref 1.4–6.5)
PLATELETS: 416 10*3/uL (ref 150–440)
RBC: 2.73 MIL/uL — AB (ref 4.40–5.90)
RDW: 21.3 % — ABNORMAL HIGH (ref 11.5–14.5)
WBC: 7.7 10*3/uL (ref 3.8–10.6)

## 2017-01-07 LAB — PHOSPHORUS: PHOSPHORUS: 3.2 mg/dL (ref 2.5–4.6)

## 2017-01-07 LAB — COMPREHENSIVE METABOLIC PANEL
ALK PHOS: 125 U/L (ref 38–126)
ALT: 17 U/L (ref 17–63)
ANION GAP: 12 (ref 5–15)
AST: 24 U/L (ref 15–41)
Albumin: 2.7 g/dL — ABNORMAL LOW (ref 3.5–5.0)
BUN: 52 mg/dL — ABNORMAL HIGH (ref 6–20)
CALCIUM: 8.8 mg/dL — AB (ref 8.9–10.3)
CHLORIDE: 98 mmol/L — AB (ref 101–111)
CO2: 30 mmol/L (ref 22–32)
Creatinine, Ser: 9.08 mg/dL — ABNORMAL HIGH (ref 0.61–1.24)
GFR calc non Af Amer: 6 mL/min — ABNORMAL LOW (ref >60)
GFR, EST AFRICAN AMERICAN: 7 mL/min — AB (ref >60)
Glucose, Bld: 130 mg/dL — ABNORMAL HIGH (ref 65–99)
Potassium: 5.1 mmol/L (ref 3.5–5.1)
SODIUM: 140 mmol/L (ref 135–145)
Total Bilirubin: 0.3 mg/dL (ref 0.3–1.2)
Total Protein: 6.7 g/dL (ref 6.5–8.1)

## 2017-01-07 LAB — HEMOGLOBIN AND HEMATOCRIT, BLOOD
HEMATOCRIT: 25.4 % — AB (ref 40.0–52.0)
HEMOGLOBIN: 8.3 g/dL — AB (ref 13.0–18.0)

## 2017-01-07 LAB — MRSA PCR SCREENING: MRSA by PCR: NEGATIVE

## 2017-01-07 LAB — PROTIME-INR
INR: 1.1
Prothrombin Time: 14.1 seconds (ref 11.4–15.2)

## 2017-01-07 LAB — PREPARE RBC (CROSSMATCH)

## 2017-01-07 MED ORDER — SODIUM CHLORIDE 0.9 % IV SOLN: Freq: Once | INTRAVENOUS | Status: DC

## 2017-01-07 MED ORDER — DIPHENHYDRAMINE HCL 25 MG PO CAPS
25.0000 mg | ORAL_CAPSULE | Freq: Four times a day (QID) | ORAL | Status: DC | PRN
  Administered 2017-01-07 – 2017-01-12 (×4): 25 mg via ORAL
  Filled 2017-01-07 (×3): qty 1

## 2017-01-07 MED ORDER — LABETALOL HCL 100 MG PO TABS
100.0000 mg | ORAL_TABLET | Freq: Every day | ORAL | Status: DC
  Administered 2017-01-08: 100 mg via ORAL
  Filled 2017-01-07: qty 1

## 2017-01-07 MED ORDER — HEPARIN SODIUM (PORCINE) 1000 UNIT/ML DIALYSIS
50.0000 [IU]/kg | INTRAMUSCULAR | Status: DC | PRN
  Filled 2017-01-07: qty 4

## 2017-01-07 MED ORDER — DIPHENHYDRAMINE HCL 50 MG/ML IJ SOLN: 25.0000 mg | Freq: Once | INTRAMUSCULAR | Status: DC

## 2017-01-07 MED ORDER — NICOTINE 21 MG/24HR TD PT24
21.0000 mg | MEDICATED_PATCH | Freq: Every day | TRANSDERMAL | Status: DC
  Administered 2017-01-07 – 2017-01-13 (×6): 21 mg via TRANSDERMAL
  Filled 2017-01-07 (×7): qty 1

## 2017-01-07 MED ORDER — ALBUTEROL SULFATE (2.5 MG/3ML) 0.083% IN NEBU: 3.0000 mL | INHALATION_SOLUTION | RESPIRATORY_TRACT | Status: DC | PRN

## 2017-01-07 MED ORDER — GABAPENTIN 300 MG PO CAPS
300.0000 mg | ORAL_CAPSULE | Freq: Two times a day (BID) | ORAL | Status: DC
  Administered 2017-01-07 – 2017-01-09 (×5): 300 mg via ORAL
  Filled 2017-01-07 (×5): qty 1

## 2017-01-07 MED ORDER — HEPARIN SODIUM (PORCINE) 1000 UNIT/ML DIALYSIS: 50.0000 [IU]/kg | INTRAMUSCULAR | Status: DC | PRN

## 2017-01-07 MED ORDER — ACETAMINOPHEN 650 MG RE SUPP: 650.0000 mg | Freq: Four times a day (QID) | RECTAL | Status: DC | PRN

## 2017-01-07 MED ORDER — SODIUM CHLORIDE 0.9 % IV SOLN
INTRAVENOUS | Status: DC
  Administered 2017-01-07 (×2): via INTRAVENOUS

## 2017-01-07 MED ORDER — DEXTROSE 5 % IV SOLN
1.0000 g | INTRAVENOUS | Status: DC
  Administered 2017-01-08 – 2017-01-13 (×6): 1 g via INTRAVENOUS
  Filled 2017-01-07 (×6): qty 10

## 2017-01-07 MED ORDER — IPRATROPIUM-ALBUTEROL 0.5-2.5 (3) MG/3ML IN SOLN
3.0000 mL | Freq: Four times a day (QID) | RESPIRATORY_TRACT | Status: DC
  Administered 2017-01-07 – 2017-01-08 (×4): 3 mL via RESPIRATORY_TRACT
  Filled 2017-01-07 (×3): qty 3

## 2017-01-07 MED ORDER — CEFTRIAXONE SODIUM IN DEXTROSE 20 MG/ML IV SOLN
1.0000 g | Freq: Once | INTRAVENOUS | Status: AC
  Administered 2017-01-07: 1 g via INTRAVENOUS
  Filled 2017-01-07: qty 50

## 2017-01-07 MED ORDER — ONDANSETRON HCL 4 MG PO TABS: 4.0000 mg | ORAL_TABLET | Freq: Four times a day (QID) | ORAL | Status: DC | PRN

## 2017-01-07 MED ORDER — MORPHINE SULFATE (PF) 2 MG/ML IV SOLN
2.0000 mg | INTRAVENOUS | Status: DC | PRN
  Administered 2017-01-07 – 2017-01-12 (×33): 2 mg via INTRAVENOUS
  Filled 2017-01-07 (×34): qty 1

## 2017-01-07 MED ORDER — PANTOPRAZOLE SODIUM 40 MG IV SOLR
40.0000 mg | Freq: Once | INTRAVENOUS | Status: AC
  Administered 2017-01-07: 40 mg via INTRAVENOUS
  Filled 2017-01-07: qty 40

## 2017-01-07 MED ORDER — PANTOPRAZOLE SODIUM 40 MG IV SOLR
40.0000 mg | Freq: Two times a day (BID) | INTRAVENOUS | Status: DC
  Administered 2017-01-07 – 2017-01-13 (×12): 40 mg via INTRAVENOUS
  Filled 2017-01-07 (×12): qty 40

## 2017-01-07 MED ORDER — ASPIRIN EC 81 MG PO TBEC
81.0000 mg | DELAYED_RELEASE_TABLET | Freq: Every day | ORAL | Status: DC
  Administered 2017-01-08 – 2017-01-13 (×6): 81 mg via ORAL
  Filled 2017-01-07 (×6): qty 1

## 2017-01-07 MED ORDER — SEVELAMER CARBONATE 800 MG PO TABS
2400.0000 mg | ORAL_TABLET | Freq: Three times a day (TID) | ORAL | Status: DC
  Administered 2017-01-08 – 2017-01-13 (×11): 2400 mg via ORAL
  Filled 2017-01-07 (×12): qty 3

## 2017-01-07 MED ORDER — ACETAMINOPHEN 325 MG PO TABS
650.0000 mg | ORAL_TABLET | Freq: Four times a day (QID) | ORAL | Status: DC | PRN
  Administered 2017-01-12 – 2017-01-13 (×3): 650 mg via ORAL
  Filled 2017-01-07 (×3): qty 2

## 2017-01-07 MED ORDER — FUROSEMIDE 40 MG PO TABS
80.0000 mg | ORAL_TABLET | Freq: Every day | ORAL | Status: DC
  Administered 2017-01-08 – 2017-01-13 (×6): 80 mg via ORAL
  Filled 2017-01-07 (×6): qty 2

## 2017-01-07 MED ORDER — ATORVASTATIN CALCIUM 20 MG PO TABS
40.0000 mg | ORAL_TABLET | Freq: Every day | ORAL | Status: DC
  Administered 2017-01-07 – 2017-01-13 (×7): 40 mg via ORAL
  Filled 2017-01-07 (×7): qty 2

## 2017-01-07 MED ORDER — LORAZEPAM 2 MG/ML IJ SOLN
0.5000 mg | INTRAMUSCULAR | Status: DC | PRN
  Administered 2017-01-10 – 2017-01-11 (×2): 0.5 mg via INTRAVENOUS
  Filled 2017-01-07 (×2): qty 1

## 2017-01-07 MED ORDER — MOMETASONE FURO-FORMOTEROL FUM 200-5 MCG/ACT IN AERO
2.0000 | INHALATION_SPRAY | Freq: Two times a day (BID) | RESPIRATORY_TRACT | Status: DC
Start: 2017-01-07 — End: 2017-01-13
  Administered 2017-01-07 – 2017-01-13 (×12): 2 via RESPIRATORY_TRACT
  Filled 2017-01-07: qty 8.8

## 2017-01-07 MED ORDER — TIOTROPIUM BROMIDE MONOHYDRATE 18 MCG IN CAPS
18.0000 ug | ORAL_CAPSULE | Freq: Every day | RESPIRATORY_TRACT | Status: DC
  Administered 2017-01-08 – 2017-01-13 (×6): 18 ug via RESPIRATORY_TRACT
  Filled 2017-01-07 (×2): qty 5

## 2017-01-07 MED ORDER — EPOETIN ALFA 10000 UNIT/ML IJ SOLN
4000.0000 [IU] | INTRAMUSCULAR | Status: DC
  Administered 2017-01-10 – 2017-01-12 (×2): 4000 [IU] via INTRAVENOUS

## 2017-01-07 MED ORDER — BISACODYL 10 MG RE SUPP: 10.0000 mg | Freq: Every day | RECTAL | Status: DC | PRN

## 2017-01-07 MED ORDER — DOCUSATE SODIUM 100 MG PO CAPS
100.0000 mg | ORAL_CAPSULE | Freq: Two times a day (BID) | ORAL | Status: DC
  Administered 2017-01-07 – 2017-01-11 (×8): 100 mg via ORAL
  Filled 2017-01-07 (×9): qty 1

## 2017-01-07 MED ORDER — NITROGLYCERIN 0.4 MG SL SUBL: 0.4000 mg | SUBLINGUAL_TABLET | SUBLINGUAL | Status: DC | PRN

## 2017-01-07 MED ORDER — ONDANSETRON HCL 4 MG/2ML IJ SOLN
4.0000 mg | Freq: Four times a day (QID) | INTRAMUSCULAR | Status: DC | PRN
Start: 2017-01-07 — End: 2017-01-13
  Administered 2017-01-10 – 2017-01-12 (×3): 4 mg via INTRAVENOUS
  Filled 2017-01-07 (×3): qty 2

## 2017-01-07 NOTE — ED Triage Notes (Signed)
Pt had dialysis on Thursday but needs again today. Dialysis center will not take pt back because has left in middle of session too many times.  Also reports normally gets weekly paracentesis for cirrhosis and ascites but has not had for week and half so needs this as well. C/o [facility] that he thinks is r/t fluid build up.  Unlabored but mild tachypnea noted.

## 2017-01-07 NOTE — Progress Notes (Signed)
Post hd assessment 

## 2017-01-07 NOTE — Progress Notes (Addendum)
Notified Dr.Sparks that no one is here to do paracentesis over the week end. MD will have to have someone called in if it needs to be done this weekend.

## 2017-01-07 NOTE — ED Notes (Addendum)
FIRST NURSE NOTE:  Missed dialysis today, states no one will dialyze him because he misses so many treatments. Last treatment was on Thursday and states he needs a paracentesis as well.

## 2017-01-07 NOTE — Progress Notes (Signed)
Hd start, alert, c/o of foot/leg pain, 2 uprbc to be transfused w/ hd, pt missed 2 hd tx.

## 2017-01-07 NOTE — Progress Notes (Signed)
   1 uprbc transfusion start in hd

## 2017-01-07 NOTE — H&P (Signed)
History and Physical    Emir Nack OEU:[id number] DOB: 1960-09-13 DOA: 01/07/2017  Referring physician: Dr. Mable Paris PCP: Alene Mires Elyse Jarvis, MD  Specialists: none  Chief Complaint: abdominal pain with SOB  HPI: Stanley [name] is a 56 y.o. male has a past medical history significant for CAD, HTN, ESRD, and cirrhosis with chronic GI bleeding who presents to ER with worsening abdominal distention with pain and SOB. In ER, pt noted to be more anemic with tense ascites. He is now admitted. No fever. Denies CP or palpitations. No N/V/D. Does admit to ongoing melena  Review of Systems: The patient denies anorexia, fever, weight loss,, vision loss, decreased hearing, hoarseness, chest pain, syncope, peripheral edema, balance deficits, hemoptysis, hematochezia, severe indigestion/heartburn, hematuria, incontinence, genital sores, muscle weakness, suspicious skin lesions, transient blindness, difficulty walking, depression, unusual weight change, abnormal bleeding, enlarged lymph nodes, angioedema, and breast masses.   Past Medical History:  Diagnosis Date  . Alcohol abuse   . CHF (congestive heart failure) ([place])   . Cirrhosis ([place])   . Coronary artery disease 2009  . Diabetic peripheral neuropathy ([place])   . Drug abuse ([place])   . End stage renal disease on dialysis [facility]) NEPHROLOGIST-   DR [facility]  IN [place]   HEMODIALYSIS --   TUES/  THURS/  SAT  . GERD (gastroesophageal reflux disease)   . Hyperlipidemia   . Hypertension   . PAD (peripheral artery disease) ([place])   . Renal insufficiency    Per pt, 32 oz fluid restriction per day  . S/P triple vessel bypass 06/09/2016   2009ish  . Suicidal ideation    & HOMICIDAL IDEATION --  06-16-2013   ADMITTED TO BEHAVIOR HEALTH   Past Surgical History:  Procedure Laterality Date  . AGILE CAPSULE N/A 06/19/2016   Procedure: AGILE CAPSULE;  Surgeon: [name], MD;  Location: [facility] ENDOSCOPY;  Service: Endoscopy;  Laterality: N/A;   . COLONOSCOPY WITH PROPOFOL N/A 06/18/2016   Procedure: COLONOSCOPY WITH PROPOFOL;  Surgeon: [name], MD;  Location: [facility] ENDOSCOPY;  Service: Endoscopy;  Laterality: N/A;  . COLONOSCOPY WITH PROPOFOL N/A 08/12/2016   Procedure: COLONOSCOPY WITH PROPOFOL;  Surgeon: [name], MD;  Location: [facility] ENDOSCOPY;  Service: Endoscopy;  Laterality: N/A;  . CORONARY ANGIOPLASTY  ?   PT UNABLE TO TELL IF  BEFORE OR AFTER  CABG  . CORONARY ARTERY BYPASS GRAFT  2008  ([place])   3 VESSEL  . DIALYSIS FISTULA CREATION  LAST SURGERY  APPOX  2008  . ENTEROSCOPY N/A 05/10/2016   Procedure: ENTEROSCOPY;  Surgeon: Jerene Bears, MD;  Location: [place];  Service: Gastroenterology;  Laterality: N/A;  . ENTEROSCOPY N/A 08/12/2016   Procedure: ENTEROSCOPY;  Surgeon: [name], MD;  Location: [facility] ENDOSCOPY;  Service: Endoscopy;  Laterality: N/A;  . ESOPHAGOGASTRODUODENOSCOPY N/A 05/07/2015   Procedure: ESOPHAGOGASTRODUODENOSCOPY (EGD);  Surgeon: Hulen Luster, MD;  Location: [facility] ENDOSCOPY;  Service: Endoscopy;  Laterality: N/A;  . ESOPHAGOGASTRODUODENOSCOPY (EGD) WITH PROPOFOL N/A 05/17/2015   Procedure: ESOPHAGOGASTRODUODENOSCOPY (EGD) WITH PROPOFOL;  Surgeon: [name], MD;  Location: [facility] ENDOSCOPY;  Service: Endoscopy;  Laterality: N/A;  . ESOPHAGOGASTRODUODENOSCOPY (EGD) WITH PROPOFOL N/A 01/20/2016   Procedure: ESOPHAGOGASTRODUODENOSCOPY (EGD) WITH PROPOFOL;  Surgeon: [name], MD;  Location: [facility] ENDOSCOPY;  Service: Endoscopy;  Laterality: N/A;  . ESOPHAGOGASTRODUODENOSCOPY (EGD) WITH PROPOFOL N/A 04/17/2016   Procedure: ESOPHAGOGASTRODUODENOSCOPY (EGD) WITH PROPOFOL;  Surgeon: Lin Landsman, MD;  Location: [facility] ENDOSCOPY;  Service: Gastroenterology;  Laterality: N/A;  .  ESOPHAGOGASTRODUODENOSCOPY (EGD) WITH PROPOFOL  05/09/2016   Procedure: ESOPHAGOGASTRODUODENOSCOPY (EGD) WITH PROPOFOL;  Surgeon: Jerene Bears, MD;  Location: [place];  Service: Endoscopy;;  . ESOPHAGOGASTRODUODENOSCOPY  (EGD) WITH PROPOFOL N/A 06/16/2016   Procedure: ESOPHAGOGASTRODUODENOSCOPY (EGD) WITH PROPOFOL;  Surgeon: [name], MD;  Location: [facility] ENDOSCOPY;  Service: Endoscopy;  Laterality: N/A;  . GIVENS CAPSULE STUDY N/A 05/07/2016   Procedure: GIVENS CAPSULE STUDY;  Surgeon: Doran Stabler, MD;  Location: [place];  Service: Endoscopy;  Laterality: N/A;  . MANDIBULAR HARDWARE REMOVAL N/A 07/29/2013   Procedure: REMOVAL OF ARCH BARS;  Surgeon: Theodoro Kos, DO;  Location: [place];  Service: Plastics;  Laterality: N/A;  . ORIF MANDIBULAR FRACTURE N/A 06/05/2013   Procedure: REPAIR OF MANDIBULAR FRACTURE x 2 with maxillo-mandibular fixation ;  Surgeon: Theodoro Kos, DO;  Location: [place];  Service: Plastics;  Laterality: N/A;  . PERIPHERAL ARTERIAL STENT GRAFT Left    Social History:  reports that he has been smoking Cigarettes.  He has a 6.00 pack-year smoking history. He has never used smokeless tobacco. He reports that he does not drink alcohol or use drugs.  No Known Allergies  Family History  Problem Relation Age of Onset  . Colon cancer Mother   . Cancer Father   . Cancer Sister   . Kidney disease Brother     Prior to Admission medications   Medication Sig Start Date End Date Taking? Authorizing Provider  albuterol (PROVENTIL HFA;VENTOLIN HFA) 108 (90 Base) MCG/ACT inhaler Inhale 4-6 puffs by mouth every 4 hours as needed for wheezing, cough, and/or shortness of breath 11/16/16   Vaughan Basta, MD  aspirin EC 81 MG tablet Take 1 tablet (81 mg total) by mouth daily. 11/16/16   Vaughan Basta, MD  atorvastatin (LIPITOR) 40 MG tablet Take 1 tablet (40 mg total) by mouth daily. 11/16/16   Vaughan Basta, MD  budesonide-formoterol (SYMBICORT) 160-4.5 MCG/ACT inhaler Inhale 2 puffs into the lungs daily. 11/16/16   Vaughan Basta, MD  furosemide (LASIX) 80 MG tablet Take 1 tablet (80 mg total) by mouth daily. 11/16/16   Vaughan Basta, MD   gabapentin (NEURONTIN) 300 MG capsule TAKE 1 CAPSULE BY MOUTH EVERY EVENING AND 1 EXTRA CAP ON DIALYSIS DAYS AS NEEDED FOR NERVE PAIN 08/16/16   [provider]  ipratropium-albuterol (DUONEB) 0.5-2.5 (3) MG/3ML SOLN Take 3 mLs by nebulization every 6 (six) hours as needed. 11/28/16   Loletha Grayer, MD  labetalol (NORMODYNE) 100 MG tablet Take 1 tablet (100 mg total) by mouth daily. 11/16/16   Vaughan Basta, MD  Multiple Vitamins-Minerals-FA (DIALYVITE SUPREME D) 3 MG TABS Take 1 tablet (3 mg total) by mouth daily. 11/28/16   Loletha Grayer, MD  nitroGLYCERIN (NITROSTAT) 0.4 MG SL tablet Place 1 tablet (0.4 mg total) under the tongue every 5 (five) minutes as needed. 04/12/16   Wende Bushy, MD  pantoprazole (PROTONIX) 40 MG tablet Take 1 tablet (40 mg total) by mouth daily. 11/28/16   Loletha Grayer, MD  sevelamer carbonate (RENVELA) 800 MG tablet Take 3 tablets (2,400 mg total) by mouth 3 (three) times daily with meals. 11/16/16   Vaughan Basta, MD  tiotropium (SPIRIVA HANDIHALER) 18 MCG inhalation capsule Place 1 capsule (18 mcg total) into inhaler and inhale daily. 11/16/16 11/16/17  Vaughan Basta, MD   Physical Exam: Vitals:   01/07/17 1153  BP: 126/69  Pulse: 81  Resp: (!) 26  Temp: 98.9 F (37.2 C)  TempSrc: Oral  SpO2: 100%  Weight: 80.7 kg (178 lb)  Height: 6\' 3"  (1.905 m)     General:  No apparent distress, WDWN, [place]/AT  Eyes: PERRL, EOMI, no scleral icterus, conjunctiva pale  ENT: moist oropharynx without exudate, TM's benign, dentition fair  Neck: supple, no lymphadenopathy. No bruits or thyromegaly  Cardiovascular: regular rate without MRG; 2+ peripheral pulses, no JVD, no peripheral edema  Respiratory: scattered wheezes and rhonchi without rales. No dullness. Respiratory effort normal  Abdomen: distended and tense,  tender to palpation diffusely, positive bowel sounds, positive guarding, no rebound. Positive fluid wave  Skin: no  rashes or lesions  Musculoskeletal: normal bulk and tone, no joint swelling  Psychiatric: normal mood and affect, A&OX3  Neurologic: CN 2-12 grossly intact, Motor strength 5/5 in all 4 groups with symmetric DTR's and non-focal sensory exam  Labs on Admission:  Basic Metabolic Panel:  Recent Labs Lab 01/05/17 1357 01/07/17 1152  NA 140 140  K 4.3 5.1  CL 94* 98*  CO2 32 30  GLUCOSE 94 130*  BUN 29* 52*  CREATININE 6.07* 9.08*  CALCIUM 8.8* 8.8*   Liver Function Tests:  Recent Labs Lab 01/05/17 1357 01/07/17 1152  AST 30 24  ALT 21 17  ALKPHOS 155* 125  BILITOT 0.5 0.3  PROT 7.6 6.7  ALBUMIN 3.2* 2.7*   No results for input(s): LIPASE, AMYLASE in the last 168 hours. No results for input(s): AMMONIA in the last 168 hours. CBC:  Recent Labs Lab 01/05/17 1357 01/07/17 1152  WBC 6.9 7.7  NEUTROABS 5.3 5.9  HGB 7.9* 6.6*  HCT 26.5* 22.1*  MCV 80.9 81.0  PLT 461* 416   Cardiac Enzymes:  Recent Labs Lab 01/05/17 1357  TROPONINI 0.05*    BNP (last 3 results)  Recent Labs  09/15/16 1528 10/20/16 1153 11/26/16 0633  BNP 2,716.0* 3,568.0* 3,470.0*    ProBNP (last 3 results) No results for input(s): PROBNP in the last 8760 hours.  CBG: No results for input(s): GLUCAP in the last 168 hours.  Radiological Exams on Admission: Dg Chest Port 1 View  Result Date: 01/07/2017 CLINICAL DATA:  Shortness of breath.  Dialysis patient. EXAM: PORTABLE CHEST 1 VIEW COMPARISON:  12/20/2016 FINDINGS: Mild fullness in the central vascular structures without overt pulmonary edema. Heart size is upper limits of normal and stable. Prior median sternotomy. No large pleural effusions. Subtle lucency underneath the right hemidiaphragm. IMPRESSION: Mild vascular congestion without overt pulmonary edema. Subtle lucency underneath the right hemidiaphragm is nonspecific. Difficult to exclude a small amount of intraperitoneal air. Recommend dedicated abdominal images with a  decubitus view for further characterization. Electronically Signed   By: [name] M.D.   On: 01/07/2017 13:18    EKG: Independently reviewed.  Assessment/Plan Principal Problem:   Upper GI bleed Active Problems:   ESRD on dialysis ([place])   Ascites   Abdominal pain   Anemia due to chronic blood loss   Will admit to floor. Transfuse 2uPRBC's. US guided paracentesis ordered. Begin IV ABX, SVN's, and prn IV morphine. Consult GI for bleeding and Nephrology for ESRD. Repeat labs in AM.  Diet: clear liquids Fluids: NS@75  DVT Prophylaxis: TED hose  Code Status: FULL  Family Communication: none  Disposition Plan: home  Time spent: 50 min

## 2017-01-07 NOTE — Progress Notes (Signed)
Hd end 

## 2017-01-07 NOTE — Consult Note (Signed)
Stanley [name] , MD [address] [place], [place], [place], [address] [address], [place], [place], [place], [address] Phone: [phone]  Fax: [phone]  Consultation  Referring Provider:    Dr Doy Hutching  Primary Care Physician:  Alene Mires Elyse Jarvis, MD Primary Gastroenterologist: [facility]     Reason for Consultation:     Anemia  Date of Admission:  01/07/2017 Date of Consultation:  01/07/2017         HPI:   Stanley [name] is a 56 y.o. male who is well known to [name]. He has a history of non compliance, does not follow up in the outpatient, leaves AMA. He has been informed on multiple occasions that he needs to follow up with [facility] for small bowel enteroscopy to evaluate for small bowel AVM's but he doesn't do so. He has had multiple admissions for anemia .He has a history of alcoholic liver cirrhosis   Summary of history   08/12/16 -colonoscopy few non bleeding AVM;s ablated  08/12/16- Enteroscopy - Few non bleeding duodenal AVM;s seen and ablated.  capsule study 06/21/16- normal  EGD 06/16/16- normal , colonoscopy , 06/18/16 - poor prep but no source of bleeding seen .  Push enteroscopy in 04/2016- AVM in jejunum ablated. This was revealed after a capsule study on 05/10/16 .  04/17/16 -push enteroscopy was normal  01/20/16-normal EGD except for gastritis 04/2015- colonoscopy showed no source of bleeding - very rapid witdrawal time of 3 mins    He was admitted on this admission today with abdominal distension , pain . Found ascites more tense, anemic. He was discharged reently on 12/24/16 when admitted for hyperkalemia due to non compliance with dialysis , left AMA.  Today on admission Hb 6.6 down from 7.3  Weeks back , MCV 81 .   He says his abdomen has been distended last few days. "always has black stool" , did not follow up with [facility] as he didn't know about it. Denies any pain or fever orNSAID use or hematemesis, Denies any alcohol use.  Past Medical History:  Diagnosis  Date  . Alcohol abuse   . CHF (congestive heart failure) ([place])   . Cirrhosis ([place])   . Coronary artery disease 2009  . Diabetic peripheral neuropathy ([place])   . Drug abuse ([place])   . End stage renal disease on dialysis [facility]) NEPHROLOGIST-   DR [facility]  IN [place]   HEMODIALYSIS --   TUES/  THURS/  SAT  . GERD (gastroesophageal reflux disease)   . Hyperlipidemia   . Hypertension   . PAD (peripheral artery disease) ([place])   . Renal insufficiency    Per pt, 32 oz fluid restriction per day  . S/P triple vessel bypass 06/09/2016   2009ish  . Suicidal ideation    & HOMICIDAL IDEATION --  06-16-2013   ADMITTED TO BEHAVIOR HEALTH    Past Surgical History:  Procedure Laterality Date  . AGILE CAPSULE N/A 06/19/2016   Procedure: AGILE CAPSULE;  Surgeon: Stanley Bellows, MD;  Location: [facility] ENDOSCOPY;  Service: Endoscopy;  Laterality: N/A;  . COLONOSCOPY WITH PROPOFOL N/A 06/18/2016   Procedure: COLONOSCOPY WITH PROPOFOL;  Surgeon: Stanley Bellows, MD;  Location: [facility] ENDOSCOPY;  Service: Endoscopy;  Laterality: N/A;  . COLONOSCOPY WITH PROPOFOL N/A 08/12/2016   Procedure: COLONOSCOPY WITH PROPOFOL;  Surgeon: [name], MD;  Location: [facility] ENDOSCOPY;  Service: Endoscopy;  Laterality: N/A;  . CORONARY ANGIOPLASTY  ?   PT UNABLE TO TELL IF  BEFORE  OR AFTER  CABG  . CORONARY ARTERY BYPASS GRAFT  2008  ([place] , [place])   3 VESSEL  . DIALYSIS FISTULA CREATION  LAST SURGERY  APPOX  2008  . ENTEROSCOPY N/A 05/10/2016   Procedure: ENTEROSCOPY;  Surgeon: Jerene Bears, MD;  Location: [place];  Service: Gastroenterology;  Laterality: N/A;  . ENTEROSCOPY N/A 08/12/2016   Procedure: ENTEROSCOPY;  Surgeon: [name], MD;  Location: [facility] ENDOSCOPY;  Service: Endoscopy;  Laterality: N/A;  . ESOPHAGOGASTRODUODENOSCOPY N/A 05/07/2015   Procedure: ESOPHAGOGASTRODUODENOSCOPY (EGD);  Surgeon: Hulen Luster, MD;  Location: [facility] ENDOSCOPY;  Service: Endoscopy;  Laterality: N/A;  . ESOPHAGOGASTRODUODENOSCOPY (EGD) WITH PROPOFOL  N/A 05/17/2015   Procedure: ESOPHAGOGASTRODUODENOSCOPY (EGD) WITH PROPOFOL;  Surgeon: [name], MD;  Location: [facility] ENDOSCOPY;  Service: Endoscopy;  Laterality: N/A;  . ESOPHAGOGASTRODUODENOSCOPY (EGD) WITH PROPOFOL N/A 01/20/2016   Procedure: ESOPHAGOGASTRODUODENOSCOPY (EGD) WITH PROPOFOL;  Surgeon: Stanley Bellows, MD;  Location: [facility] ENDOSCOPY;  Service: Endoscopy;  Laterality: N/A;  . ESOPHAGOGASTRODUODENOSCOPY (EGD) WITH PROPOFOL N/A 04/17/2016   Procedure: ESOPHAGOGASTRODUODENOSCOPY (EGD) WITH PROPOFOL;  Surgeon: Lin Landsman, MD;  Location: [facility] ENDOSCOPY;  Service: Gastroenterology;  Laterality: N/A;  . ESOPHAGOGASTRODUODENOSCOPY (EGD) WITH PROPOFOL  05/09/2016   Procedure: ESOPHAGOGASTRODUODENOSCOPY (EGD) WITH PROPOFOL;  Surgeon: Jerene Bears, MD;  Location: [place];  Service: Endoscopy;;  . ESOPHAGOGASTRODUODENOSCOPY (EGD) WITH PROPOFOL N/A 06/16/2016   Procedure: ESOPHAGOGASTRODUODENOSCOPY (EGD) WITH PROPOFOL;  Surgeon: [name], MD;  Location: [facility] ENDOSCOPY;  Service: Endoscopy;  Laterality: N/A;  . GIVENS CAPSULE STUDY N/A 05/07/2016   Procedure: GIVENS CAPSULE STUDY;  Surgeon: Doran Stabler, MD;  Location: [place];  Service: Endoscopy;  Laterality: N/A;  . MANDIBULAR HARDWARE REMOVAL N/A 07/29/2013   Procedure: REMOVAL OF ARCH BARS;  Surgeon: Theodoro Kos, DO;  Location: [place];  Service: Plastics;  Laterality: N/A;  . ORIF MANDIBULAR FRACTURE N/A 06/05/2013   Procedure: REPAIR OF MANDIBULAR FRACTURE x 2 with maxillo-mandibular fixation ;  Surgeon: Theodoro Kos, DO;  Location: [place];  Service: Plastics;  Laterality: N/A;  . PERIPHERAL ARTERIAL STENT GRAFT Left     Prior to Admission medications   Medication Sig Start Date End Date Taking? Authorizing Provider  albuterol (PROVENTIL HFA;VENTOLIN HFA) 108 (90 Base) MCG/ACT inhaler Inhale 4-6 puffs by mouth every 4 hours as needed for wheezing, cough, and/or shortness of breath 11/16/16  Yes Vaughan Basta, MD  aspirin EC 81 MG tablet Take 1 tablet (81 mg total) by mouth daily. 11/16/16  Yes Vaughan Basta, MD  atorvastatin (LIPITOR) 40 MG tablet Take 1 tablet (40 mg total) by mouth daily. 11/16/16  Yes Vaughan Basta, MD  budesonide-formoterol [facility]) 160-4.5 MCG/ACT inhaler Inhale 2 puffs into the lungs daily. 11/16/16  Yes Vaughan Basta, MD  furosemide (LASIX) 80 MG tablet Take 1 tablet (80 mg total) by mouth daily. 11/16/16  Yes Vaughan Basta, MD  gabapentin (NEURONTIN) 300 MG capsule TAKE 1 CAPSULE BY MOUTH EVERY EVENING AND 1 EXTRA CAP ON DIALYSIS DAYS AS NEEDED FOR NERVE PAIN 08/16/16  Yes [provider]  ipratropium-albuterol (DUONEB) 0.5-2.5 (3) MG/3ML SOLN Take 3 mLs by nebulization every 6 (six) hours as needed. 11/28/16  Yes Wieting, Richard, MD  labetalol (NORMODYNE) 100 MG tablet Take 1 tablet (100 mg total) by mouth daily. 11/16/16  Yes Vaughan Basta, MD  Multiple Vitamins-Minerals-FA (DIALYVITE SUPREME D) 3 MG TABS Take 1 tablet (3 mg total) by mouth daily. 11/28/16  Yes Loletha Grayer, MD  pantoprazole (PROTONIX) 40 MG  tablet Take 1 tablet (40 mg total) by mouth daily. 11/28/16  Yes Wieting, Richard, MD  tiotropium (SPIRIVA HANDIHALER) 18 MCG inhalation capsule Place 1 capsule (18 mcg total) into inhaler and inhale daily. 11/16/16 11/16/17 Yes Vaughan Basta, MD  nitroGLYCERIN (NITROSTAT) 0.4 MG SL tablet Place 1 tablet (0.4 mg total) under the tongue every 5 (five) minutes as needed. 04/12/16   Wende Bushy, MD  sevelamer carbonate (RENVELA) 800 MG tablet Take 3 tablets (2,400 mg total) by mouth 3 (three) times daily with meals. 11/16/16   Vaughan Basta, MD    Family History  Problem Relation Age of Onset  . Colon cancer Mother   . Cancer Father   . Cancer Sister   . Kidney disease Brother      Social History  Substance Use Topics  . Smoking status: Current Every Day Smoker    Packs/day: 0.15    Years:  40.00    Types: Cigarettes  . Smokeless tobacco: Never Used  . Alcohol use No     Comment: pt reports quitting after learning about cirrhosis    Allergies as of 01/07/2017  . (No Known Allergies)    Review of Systems:    All systems reviewed and negative except where noted in HPI.   Physical Exam:  Vital signs in last 24 hours: Temp:  [98.1 F (36.7 C)-98.9 F (37.2 C)] 98.1 F (36.7 C) (10/27 1457) Pulse Rate:  [81-88] 88 (10/27 1457) Resp:  [17-26] 17 (10/27 1457) BP: (126-133)/(69-72) 133/72 (10/27 1457) SpO2:  [100 %] 100 % (10/27 1457) Weight:  [178 lb (80.7 kg)] 178 lb (80.7 kg) (10/27 1153)   General:   Pleasant, cooperative in NAD Head:  Normocephalic and atraumatic. Eyes:   No icterus.   Conjunctiva pink. PERRLA. Ears:  Normal auditory acuity. Neck:  Supple; no masses or thyroidomegaly Lungs: Respirations even and unlabored. Lungs clear to auscultation bilaterally.   No wheezes, crackles, or rhonchi.  Heart:  Regular rate and rhythm;  Without murmur, clicks, rubs or gallops Abdomen:  Distended, non tender, no guarding or rigidity, tense ascites - thrill ++ Neurologic:  Alert and oriented x3;  grossly normal neurologically. Skin:  Intact without significant lesions or rashes. Cervical Nodes:  No significant cervical adenopathy. Psych:  Alert and cooperative. Normal affect.  LAB RESULTS:  Recent Labs  01/05/17 1357 01/07/17 1152  WBC 6.9 7.7  HGB 7.9* 6.6*  HCT 26.5* 22.1*  PLT 461* 416   BMET  Recent Labs  01/05/17 1357 01/07/17 1152  NA 140 140  K 4.3 5.1  CL 94* 98*  CO2 32 30  GLUCOSE 94 130*  BUN 29* 52*  CREATININE 6.07* 9.08*  CALCIUM 8.8* 8.8*   LFT  Recent Labs  01/07/17 1152  PROT 6.7  ALBUMIN 2.7*  AST 24  ALT 17  ALKPHOS 125  BILITOT 0.3   PT/INR  Recent Labs  01/07/17 1322  LABPROT 14.1  INR 1.10    STUDIES: Dg Chest Port 1 View  Result Date: 01/07/2017 CLINICAL DATA:  Shortness of breath.  Dialysis patient.  EXAM: PORTABLE CHEST 1 VIEW COMPARISON:  12/20/2016 FINDINGS: Mild fullness in the central vascular structures without overt pulmonary edema. Heart size is upper limits of normal and stable. Prior median sternotomy. No large pleural effusions. Subtle lucency underneath the right hemidiaphragm. IMPRESSION: Mild vascular congestion without overt pulmonary edema. Subtle lucency underneath the right hemidiaphragm is nonspecific. Difficult to exclude a small amount of intraperitoneal air. Recommend dedicated abdominal images  with a decubitus view for further characterization. Electronically Signed   By: [name] M.D.   On: 01/07/2017 13:18      Impression / Plan:   Delmos Velaquez is a 56 y.o. 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 Low MCV, tense ascites  Plan  1. Diagnostic and therapeutic paracentesis. Send out fluid for cell count, cytology, cultures.  2. Check iron studies and if low give IV iron . Transfuse Hb as needed based on CBC 3. If no signs of overt bleeding then would need Small bowel enteroscopy at [facility]. If has overt signs of bleeding get tagged RBC scan to localize site.  4. PPI, no NSAID, no smoking,.    Thank you for involving me in the care of this patient.      LOS: 0 days   Stanley Bellows, MD  01/07/2017, 3:36 PM

## 2017-01-07 NOTE — Progress Notes (Signed)
2nd uprbc start

## 2017-01-07 NOTE — ED Notes (Addendum)
Pt states is here for paracentesis and dialysis, last one a week ago. Last dialysis was thurs but was only half a session

## 2017-01-07 NOTE — Progress Notes (Signed)
Per Dr. [name] okay to discontinue maintenance IV fluids.

## 2017-01-07 NOTE — ED Provider Notes (Signed)
[facility] Emergency Department Provider Note  ____________________________________________   First MD Initiated Contact with Patient 01/07/17 1244     (approximate)  I have reviewed the triage vital signs and the nursing notes.   HISTORY  Chief Complaint needs dialysis/paracentesis   HPI Stanley [name] is a 57 y.o. male who self presents to the emergency department requesting dialysis and a paracentesis.  The patient has a complex past medical history including cirrhosis, end-stage renal disease, and frequent GI bleeds.  He last received half a run of dialysis via graft in his right upper extremity 2 days ago.  His last paracentesis was 5 L roughly 1 week ago and he says that he gets 7-8 L taken off normally every week.  He does report black tarry stools worsening over the past several days.  His symptoms have been insidious in onset slowly progressive.  He is quite short of breath worse when lying flat on his back and improved somewhat on his side.  Past Medical History:  Diagnosis Date  . Alcohol abuse   . CHF (congestive heart failure) ([place])   . Cirrhosis ([place])   . Coronary artery disease 2009  . Diabetic peripheral neuropathy ([place])   . Drug abuse ([place])   . End stage renal disease on dialysis [facility]) NEPHROLOGIST-   DR [facility]  IN [place]   HEMODIALYSIS --   TUES/  THURS/  SAT  . GERD (gastroesophageal reflux disease)   . Hyperlipidemia   . Hypertension   . PAD (peripheral artery disease) ([place])   . Renal insufficiency    Per pt, 32 oz fluid restriction per day  . S/P triple vessel bypass 06/09/2016   2009ish  . Suicidal ideation    & HOMICIDAL IDEATION --  06-16-2013   ADMITTED TO BEHAVIOR HEALTH    Patient Active Problem List   Diagnosis Date Noted  . Shortness of breath 11/26/2016  . COPD (chronic obstructive pulmonary disease) ([place]) 10/30/2016  . COPD exacerbation ([place]) 10/29/2016  . Anemia   . Heme positive stool   . Ulceration of  intestine   . Benign neoplasm of transverse colon   . Acute gastrointestinal hemorrhage   . Esophageal candidiasis ([place])   . Angiodysplasia of intestinal tract   . Acute respiratory failure with hypoxia ([place]) 07/03/2016  . GI bleeding 06/24/2016  . Rectal bleeding 06/14/2016  . Anemia of chronic disease 06/01/2016  . MRSA carrier 06/01/2016  . Chronic renal failure 05/23/2016  . Ischemic heart disease 05/23/2016  . Angiodysplasia of small intestine   . Melena   . Small bowel bleed not requiring more than 4 units of blood in 24 hours, ICU, or surgery   . Anemia due to chronic blood loss   . Abdominal pain 05/05/2016  . Acute posthemorrhagic anemia 04/17/2016  . Gastrointestinal bleed 04/17/2016  . History of esophagogastroduodenoscopy (EGD) 04/17/2016  . Elevated troponin 04/17/2016  . Alcohol abuse 04/17/2016  . Upper GI bleed 01/19/2016  . Blood in stool   . Angiodysplasia of stomach and duodenum with hemorrhage   . Gastritis   . Esophagitis, unspecified   . GI bleed 05/16/2015  . Acute GI bleeding   . Symptomatic anemia 04/30/2015  . HTN (hypertension) 04/06/2015  . GERD (gastroesophageal reflux disease) 04/06/2015  . HLD (hyperlipidemia) 04/06/2015  . Dyspnea 04/06/2015  . Cirrhosis of liver with ascites ([place]) 04/06/2015  . Ascites 04/06/2015  . GIB (gastrointestinal bleeding) 03/23/2015  . Homicidal ideation 06/19/2013  . Suicidal  intent 06/19/2013  . Homicidal ideations 06/19/2013  . Hyperkalemia 06/16/2013  . Mandible fracture ([place]) 06/05/2013  . Fracture, mandible ([place]) 06/02/2013  . Coronary atherosclerosis of native coronary artery 06/02/2013  . ESRD on dialysis ([place]) 06/02/2013  . Mandible open fracture ([place]) 06/02/2013    Past Surgical History:  Procedure Laterality Date  . AGILE CAPSULE N/A 06/19/2016   Procedure: AGILE CAPSULE;  Surgeon: [name], MD;  Location: [facility] ENDOSCOPY;  Service: Endoscopy;  Laterality: N/A;  . COLONOSCOPY WITH PROPOFOL N/A  06/18/2016   Procedure: COLONOSCOPY WITH PROPOFOL;  Surgeon: [name], MD;  Location: [facility] ENDOSCOPY;  Service: Endoscopy;  Laterality: N/A;  . COLONOSCOPY WITH PROPOFOL N/A 08/12/2016   Procedure: COLONOSCOPY WITH PROPOFOL;  Surgeon: [name], MD;  Location: [facility] ENDOSCOPY;  Service: Endoscopy;  Laterality: N/A;  . CORONARY ANGIOPLASTY  ?   PT UNABLE TO TELL IF  BEFORE OR AFTER  CABG  . CORONARY ARTERY BYPASS GRAFT  2008  ([place] , [place])   3 VESSEL  . DIALYSIS FISTULA CREATION  LAST SURGERY  APPOX  2008  . ENTEROSCOPY N/A 05/10/2016   Procedure: ENTEROSCOPY;  Surgeon: Jerene Bears, MD;  Location: [place];  Service: Gastroenterology;  Laterality: N/A;  . ENTEROSCOPY N/A 08/12/2016   Procedure: ENTEROSCOPY;  Surgeon: [name], MD;  Location: [facility] ENDOSCOPY;  Service: Endoscopy;  Laterality: N/A;  . ESOPHAGOGASTRODUODENOSCOPY N/A 05/07/2015   Procedure: ESOPHAGOGASTRODUODENOSCOPY (EGD);  Surgeon: Hulen Luster, MD;  Location: [facility] ENDOSCOPY;  Service: Endoscopy;  Laterality: N/A;  . ESOPHAGOGASTRODUODENOSCOPY (EGD) WITH PROPOFOL N/A 05/17/2015   Procedure: ESOPHAGOGASTRODUODENOSCOPY (EGD) WITH PROPOFOL;  Surgeon: [name], MD;  Location: [facility] ENDOSCOPY;  Service: Endoscopy;  Laterality: N/A;  . ESOPHAGOGASTRODUODENOSCOPY (EGD) WITH PROPOFOL N/A 01/20/2016   Procedure: ESOPHAGOGASTRODUODENOSCOPY (EGD) WITH PROPOFOL;  Surgeon: [name], MD;  Location: [facility] ENDOSCOPY;  Service: Endoscopy;  Laterality: N/A;  . ESOPHAGOGASTRODUODENOSCOPY (EGD) WITH PROPOFOL N/A 04/17/2016   Procedure: ESOPHAGOGASTRODUODENOSCOPY (EGD) WITH PROPOFOL;  Surgeon: Lin Landsman, MD;  Location: [facility] ENDOSCOPY;  Service: Gastroenterology;  Laterality: N/A;  . ESOPHAGOGASTRODUODENOSCOPY (EGD) WITH PROPOFOL  05/09/2016   Procedure: ESOPHAGOGASTRODUODENOSCOPY (EGD) WITH PROPOFOL;  Surgeon: Jerene Bears, MD;  Location: [place];  Service: Endoscopy;;  . ESOPHAGOGASTRODUODENOSCOPY (EGD) WITH PROPOFOL N/A 06/16/2016    Procedure: ESOPHAGOGASTRODUODENOSCOPY (EGD) WITH PROPOFOL;  Surgeon: [name], MD;  Location: [facility] ENDOSCOPY;  Service: Endoscopy;  Laterality: N/A;  . GIVENS CAPSULE STUDY N/A 05/07/2016   Procedure: GIVENS CAPSULE STUDY;  Surgeon: Doran Stabler, MD;  Location: [place];  Service: Endoscopy;  Laterality: N/A;  . MANDIBULAR HARDWARE REMOVAL N/A 07/29/2013   Procedure: REMOVAL OF ARCH BARS;  Surgeon: Theodoro Kos, DO;  Location: [place];  Service: Plastics;  Laterality: N/A;  . ORIF MANDIBULAR FRACTURE N/A 06/05/2013   Procedure: REPAIR OF MANDIBULAR FRACTURE x 2 with maxillo-mandibular fixation ;  Surgeon: Theodoro Kos, DO;  Location: [place];  Service: Plastics;  Laterality: N/A;  . PERIPHERAL ARTERIAL STENT GRAFT Left     Prior to Admission medications   Medication Sig Start Date End Date Taking? Authorizing Provider  albuterol (PROVENTIL HFA;VENTOLIN HFA) 108 (90 Base) MCG/ACT inhaler Inhale 4-6 puffs by mouth every 4 hours as needed for wheezing, cough, and/or shortness of breath 11/16/16   Vaughan Basta, MD  aspirin EC 81 MG tablet Take 1 tablet (81 mg total) by mouth daily. 11/16/16   Vaughan Basta, MD  atorvastatin (LIPITOR) 40 MG tablet Take 1 tablet (40 mg total) by  mouth daily. 11/16/16   Vaughan Basta, MD  budesonide-formoterol (SYMBICORT) 160-4.5 MCG/ACT inhaler Inhale 2 puffs into the lungs daily. 11/16/16   Vaughan Basta, MD  furosemide (LASIX) 80 MG tablet Take 1 tablet (80 mg total) by mouth daily. 11/16/16   Vaughan Basta, MD  gabapentin (NEURONTIN) 300 MG capsule TAKE 1 CAPSULE BY MOUTH EVERY EVENING AND 1 EXTRA CAP ON DIALYSIS DAYS AS NEEDED FOR NERVE PAIN 08/16/16   [provider]  ipratropium-albuterol (DUONEB) 0.5-2.5 (3) MG/3ML SOLN Take 3 mLs by nebulization every 6 (six) hours as needed. 11/28/16   Loletha Grayer, MD  labetalol (NORMODYNE) 100 MG tablet Take 1 tablet (100 mg total) by mouth daily.  11/16/16   Vaughan Basta, MD  Multiple Vitamins-Minerals-FA (DIALYVITE SUPREME D) 3 MG TABS Take 1 tablet (3 mg total) by mouth daily. 11/28/16   Loletha Grayer, MD  nitroGLYCERIN (NITROSTAT) 0.4 MG SL tablet Place 1 tablet (0.4 mg total) under the tongue every 5 (five) minutes as needed. 04/12/16   Wende Bushy, MD  pantoprazole (PROTONIX) 40 MG tablet Take 1 tablet (40 mg total) by mouth daily. 11/28/16   Loletha Grayer, MD  sevelamer carbonate (RENVELA) 800 MG tablet Take 3 tablets (2,400 mg total) by mouth 3 (three) times daily with meals. 11/16/16   Vaughan Basta, MD  tiotropium (SPIRIVA HANDIHALER) 18 MCG inhalation capsule Place 1 capsule (18 mcg total) into inhaler and inhale daily. 11/16/16 11/16/17  Vaughan Basta, MD    Allergies Patient has no known allergies.  Family History  Problem Relation Age of Onset  . Colon cancer Mother   . Cancer Father   . Cancer Sister   . Kidney disease Brother     Social History Social History  Substance Use Topics  . Smoking status: Current Every Day Smoker    Packs/day: 0.15    Years: 40.00    Types: Cigarettes  . Smokeless tobacco: Never Used  . Alcohol use No     Comment: pt reports quitting after learning about cirrhosis    Review of Systems Constitutional: No fever/chills Eyes: No visual changes. ENT: No sore throat. Cardiovascular: Denies chest pain. Respiratory: Positive for shortness of breath. Gastrointestinal: Positive for abdominal pain.  Positive for nausea, no vomiting.  No diarrhea.  No constipation. Genitourinary: Negative for dysuria. Musculoskeletal: Negative for back pain. Skin: Negative for rash. Neurological: Negative for headaches, focal weakness or numbness.   ____________________________________________   PHYSICAL EXAM:  VITAL SIGNS: ED Triage Vitals  Enc Vitals Group     BP 01/07/17 1153 126/69     Pulse Rate 01/07/17 1153 81     Resp 01/07/17 1153 (!) 26     Temp 01/07/17  1153 98.9 F (37.2 C)     Temp Source 01/07/17 1153 Oral     SpO2 01/07/17 1153 100 %     Weight 01/07/17 1153 178 lb (80.7 kg)     Height 01/07/17 1153 6\' 3"  (1.905 m)     Head Circumference --      Peak Flow --      Pain Score 01/07/17 1152 8     Pain Loc --      Pain Edu? --      Excl. in [place]? --     Constitutional: Alert and oriented x4 pleasant appears clearly short of breath Eyes: PERRL EOMI. Head: Atraumatic. Nose: No congestion/rhinnorhea. Mouth/Throat: No trismus Neck: No stridor.   Cardiovascular: Normal rate, regular rhythm. Grossly normal heart sounds.  Good peripheral circulation.  Respiratory: Increased respiratory effort.  No retractions. Lungs CTAB and moving good air Gastrointestinal: Severely distended abdomen intense mild diffuse tenderness with no frank peritonitis Rectal exam with frank melena Musculoskeletal: No lower extremity edema   Neurologic:  Normal speech and language. No gross focal neurologic deficits are appreciated. Skin:  Skin is warm, dry and intact. No rash noted. Psychiatric: Mood and affect are normal. Speech and behavior are normal.    ____________________________________________   DIFFERENTIAL includes but not limited to  Spontaneous bacterial peritonitis, upper GI bleed, lower GI bleed, symptomatic anemia ____________________________________________   LABS (all labs ordered are listed, but only abnormal results are displayed)  Labs Reviewed  CBC WITH DIFFERENTIAL/PLATELET - Abnormal; Notable for the following:       Result Value   RBC 2.73 (*)    Hemoglobin 6.6 (*)    HCT 22.1 (*)    MCH 24.3 (*)    MCHC 30.0 (*)    RDW 21.3 (*)    Lymphs Abs 0.7 (*)    All other components within normal limits  COMPREHENSIVE METABOLIC PANEL - Abnormal; Notable for the following:    Chloride 98 (*)    Glucose, Bld 130 (*)    BUN 52 (*)    Creatinine, Ser 9.08 (*)    Calcium 8.8 (*)    Albumin 2.7 (*)    GFR calc non Af Amer 6 (*)     GFR calc Af Amer 7 (*)    All other components within normal limits  PROTIME-INR  PREPARE RBC (CROSSMATCH)    Blood work reviewed and interpreted by me shows extremely low albumin as well as decreased hemoglobin down from his baseline __________________________________________  EKG   ____________________________________________  RADIOLOGY  Chest x-ray reviewed by me shows no pleural effusion ____________________________________________   PROCEDURES  Procedure(s) performed: no  Procedures  Critical Care performed: no  Observation: no ____________________________________________   INITIAL IMPRESSION / ASSESSMENT AND PLAN / ED COURSE  Pertinent labs & imaging results that were available during my care of the patient were reviewed by me and considered in my medical decision making (see chart for details).  The patient arrives quite short of breath with a distended abdomen.  He also has frank melena.  He has a number of abnormalities that require inpatient admission including symptomatic anemia with a hemoglobin of 6.6 and active GI bleed.  I will transfuse him 2 units of packed red cells now in addition to ordering a therapeutic and diagnostic paracentesis.  Patient does not require emergent dialysis at this time.  Verbalizes understanding and agreement to stay in the hospital.      ____________________________________________   FINAL CLINICAL IMPRESSION(S) / ED DIAGNOSES  Final diagnoses:  Pain  Symptomatic anemia  Gastrointestinal hemorrhage, unspecified gastrointestinal hemorrhage type  ESRD (end stage renal disease) ([place])      NEW MEDICATIONS STARTED DURING THIS VISIT:  New Prescriptions   No medications on file     Note:  This document was prepared using Dragon voice recognition software and may include unintentional dictation errors.     Darel Hong, MD 01/07/17 1327

## 2017-01-07 NOTE — Progress Notes (Signed)
Post hd vitals 

## 2017-01-07 NOTE — Progress Notes (Signed)
Pre hd info 

## 2017-01-07 NOTE — Progress Notes (Signed)
Pre hd assessment  

## 2017-01-07 NOTE — Progress Notes (Signed)
[facility], [place] 01/07/17  Subjective:   Patient known to our practice from outpatient and previous admissions He did not go to his dialysis today.  Presents with severe abdominal distention from ascites.  Last dialysis was an incomplete treatment on Thursday.  Objective:  Vital signs in last 24 hours:  Temp:  [98.1 F (36.7 C)-98.9 F (37.2 C)] 98.1 F (36.7 C) (10/27 1457) Pulse Rate:  [81-88] 88 (10/27 1457) Resp:  [17-26] 17 (10/27 1457) BP: (126-133)/(69-72) 133/72 (10/27 1457) SpO2:  [100 %] 100 % (10/27 1457) Weight:  [80.7 kg (178 lb)] 80.7 kg (178 lb) (10/27 1153)  Weight change:  Filed Weights   01/07/17 1153  Weight: 80.7 kg (178 lb)    Intake/Output:   No intake or output data in the 24 hours ending 01/07/17 1624   Physical Exam: General:  Laying in the bed no acute distress  HEENT  anicteric, moist oral mucous membranes  Neck  distended neck veins  Pulm/lungs  normal breathing effort on room air  CVS/Heart  tachycardic, prominent systolic murmur  Abdomen:   Distended from ascites  Extremities:  No peripheral edema  Neurologic:  Alert, oriented  Skin:  No acute rashes  Access:  Right upper arm AV fistula       Basic Metabolic Panel:   Recent Labs Lab 01/05/17 1357 01/07/17 1152  NA 140 140  K 4.3 5.1  CL 94* 98*  CO2 32 30  GLUCOSE 94 130*  BUN 29* 52*  CREATININE 6.07* 9.08*  CALCIUM 8.8* 8.8*     CBC:  Recent Labs Lab 01/05/17 1357 01/07/17 1152  WBC 6.9 7.7  NEUTROABS 5.3 5.9  HGB 7.9* 6.6*  HCT 26.5* 22.1*  MCV 80.9 81.0  PLT 461* 416     Lab Results  Component Value Date   HEPBSAG Negative 11/26/2016   HEPBSAB Reactive 06/01/2016   HEPBIGM Negative 06/01/2016      Microbiology:  No results found for this or any previous visit (from the past 240 hour(s)).  Coagulation Studies:  Recent Labs  01/07/17 1322  LABPROT 14.1  INR 1.10    Urinalysis: No results for input(s):  COLORURINE, LABSPEC, PHURINE, GLUCOSEU, HGBUR, BILIRUBINUR, KETONESUR, PROTEINUR, UROBILINOGEN, NITRITE, LEUKOCYTESUR in the last 72 hours.  Invalid input(s): APPERANCEUR    Imaging: Dg Chest Port 1 View  Result Date: 01/07/2017 CLINICAL DATA:  Shortness of breath.  Dialysis patient. EXAM: PORTABLE CHEST 1 VIEW COMPARISON:  12/20/2016 FINDINGS: Mild fullness in the central vascular structures without overt pulmonary edema. Heart size is upper limits of normal and stable. Prior median sternotomy. No large pleural effusions. Subtle lucency underneath the right hemidiaphragm. IMPRESSION: Mild vascular congestion without overt pulmonary edema. Subtle lucency underneath the right hemidiaphragm is nonspecific. Difficult to exclude a small amount of intraperitoneal air. Recommend dedicated abdominal images with a decubitus view for further characterization. Electronically Signed   By: [name] M.D.   On: 01/07/2017 13:18     Medications:   . sodium chloride    . [START ON 01/08/2017] cefTRIAXone (ROCEPHIN)  IV     . aspirin EC  81 mg Oral Daily  . atorvastatin  40 mg Oral Daily  . docusate sodium  100 mg Oral BID  . [START ON 01/08/2017] furosemide  80 mg Oral Daily  . gabapentin  300 mg Oral BID  . ipratropium-albuterol  3 mL Nebulization QID  . [START ON 01/08/2017] labetalol  100 mg Oral Daily  . mometasone-formoterol  2 puff Inhalation BID  . nicotine  21 mg Transdermal Daily  . pantoprazole (PROTONIX) IV  40 mg Intravenous Q12H  . sevelamer carbonate  2,400 mg Oral TID WC  . tiotropium  18 mcg Inhalation Daily   acetaminophen **OR** acetaminophen, albuterol, bisacodyl, heparin, LORazepam, morphine injection, nitroGLYCERIN, ondansetron **OR** ondansetron (ZOFRAN) IV  Assessment/ Plan:  56 y.o. male with end stage renal disease on hemodialysis secondary to Alport's syndrome, end stage liver disease with hepatic cirrhosis and ascites, hypertension, anemia of chronic kidney disease,  coronary artery disease, peripheral vascular disease, hyperlipidemia, gastrointestinal AVMs  [facility] AVF  1. End-stage renal disease. 2. Mild Hyperkalemia 3. Hypertension 4. Anemia of chronic kidney disease, anemia secondary to GI bleed as well. 5. Ascites secondary to end-stage liver disease and cirrhosis. 6. Secondary hyperparathyroidism.  Plan: Dialysis requested for volume removal as patient is about to get 2 units of blood transfusion.  He has severely distended abdomen from ascites.  He will be scheduled for paracentesis during weekdays.  Potassium is 5.1, we will use a 2K bath.  We will also try to do ultrafiltration only session in the beginning to try to remove as much fluid as possible. Continue binders in the form of sevelamer Continue Procrit with dialysis.   LOS: 0 Arnice Vanepps 10/27/20184:24 PM  [facility] [place], [place]

## 2017-01-07 NOTE — ED Notes (Signed)
[name] called to speak with Dr. Mable Paris regarding the paracentesis order.

## 2017-01-07 NOTE — ED Notes (Addendum)
Pt came in with c/o needing his dialysis. Denies pain or any other symptoms for that. Also states his stomach is swollen and needs paracentesis. Dr in with pt for eval

## 2017-01-08 LAB — COMPREHENSIVE METABOLIC PANEL
ALBUMIN: 2.5 g/dL — AB (ref 3.5–5.0)
ALK PHOS: 117 U/L (ref 38–126)
ALT: 16 U/L — ABNORMAL LOW (ref 17–63)
AST: 20 U/L (ref 15–41)
Anion gap: 13 (ref 5–15)
BUN: 33 mg/dL — AB (ref 6–20)
CALCIUM: 8.4 mg/dL — AB (ref 8.9–10.3)
CO2: 26 mmol/L (ref 22–32)
CREATININE: 6.59 mg/dL — AB (ref 0.61–1.24)
Chloride: 99 mmol/L — ABNORMAL LOW (ref 101–111)
GFR calc non Af Amer: 8 mL/min — ABNORMAL LOW (ref >60)
GFR, EST AFRICAN AMERICAN: 10 mL/min — AB (ref >60)
GLUCOSE: 120 mg/dL — AB (ref 65–99)
Potassium: 4.8 mmol/L (ref 3.5–5.1)
SODIUM: 138 mmol/L (ref 135–145)
Total Bilirubin: 0.7 mg/dL (ref 0.3–1.2)
Total Protein: 6.3 g/dL — ABNORMAL LOW (ref 6.5–8.1)

## 2017-01-08 LAB — CBC
HCT: 26.8 % — ABNORMAL LOW (ref 40.0–52.0)
HEMOGLOBIN: 8.5 g/dL — AB (ref 13.0–18.0)
MCH: 25.8 pg — AB (ref 26.0–34.0)
MCHC: 31.9 g/dL — ABNORMAL LOW (ref 32.0–36.0)
MCV: 81 fL (ref 80.0–100.0)
Platelets: 414 10*3/uL (ref 150–440)
RBC: 3.3 MIL/uL — AB (ref 4.40–5.90)
RDW: 20.3 % — ABNORMAL HIGH (ref 11.5–14.5)
WBC: 8.6 10*3/uL (ref 3.8–10.6)

## 2017-01-08 LAB — PROTIME-INR
INR: 1.13
PROTHROMBIN TIME: 14.4 s (ref 11.4–15.2)

## 2017-01-08 LAB — GLUCOSE, CAPILLARY: Glucose-Capillary: 89 mg/dL (ref 65–99)

## 2017-01-08 MED ORDER — ALBUMIN HUMAN 25 % IV SOLN
12.5000 g | Freq: Once | INTRAVENOUS | Status: DC
  Filled 2017-01-08: qty 50

## 2017-01-08 MED ORDER — IPRATROPIUM-ALBUTEROL 0.5-2.5 (3) MG/3ML IN SOLN
3.0000 mL | Freq: Three times a day (TID) | RESPIRATORY_TRACT | Status: DC
  Administered 2017-01-08 – 2017-01-09 (×3): 3 mL via RESPIRATORY_TRACT
  Filled 2017-01-08 (×3): qty 3

## 2017-01-08 MED ORDER — DIPHENHYDRAMINE HCL 50 MG/ML IJ SOLN
12.5000 mg | Freq: Once | INTRAMUSCULAR | Status: AC
  Administered 2017-01-08: 12.5 mg via INTRAVENOUS

## 2017-01-08 NOTE — Progress Notes (Signed)
Pre HD  

## 2017-01-08 NOTE — Progress Notes (Signed)
HD TX started  

## 2017-01-08 NOTE — Progress Notes (Signed)
Pre Hd assessment

## 2017-01-08 NOTE — Progress Notes (Signed)
[place] [facility] [place] NAME: Stanley [name]    MR#:  [id number]  DATE OF BIRTH:  1961/02/19  SUBJECTIVE:   Patient feels better after 2 units of packed red blood cells. His weakness has improved.  REVIEW OF SYSTEMS:    Review of Systems  Constitutional: Negative for fever, chills weight loss HENT: Negative for ear pain, nosebleeds, congestion, facial swelling, rhinorrhea, neck pain, neck stiffness and ear discharge.   Respiratory: Negative for cough, shortness of breath, wheezing  Cardiovascular: Negative for chest pain, palpitations and leg swelling.  Gastrointestinal: Positive for dark stools and abdominal distention  Genitourinary: Negative for dysuria, urgency, frequency, hematuria Musculoskeletal: Negative for back pain or joint pain Neurological: Negative for dizziness, seizures, syncope, focal weakness,  numbness and headaches.  Hematological: Does not bruise/bleed easily.  Psychiatric/Behavioral: Negative for hallucinations, confusion, dysphoric mood    Tolerating Diet: yes clear liquid      DRUG ALLERGIES:  No Known Allergies  VITALS:  Blood pressure 131/77, pulse 85, temperature 98.2 F (36.8 C), temperature source Oral, resp. rate 16, height 6\' 3"  (1.905 m), weight 84.4 kg (186 lb 1.1 oz), SpO2 99 %.  PHYSICAL EXAMINATION:  Constitutional: Appears well-developed and well-nourished. No distress. HENT: Normocephalic. Marland Kitchen Oropharynx is clear and moist.  Eyes: Conjunctivae and EOM are normal. PERRLA, no scleral icterus.  Neck: Normal ROM. Neck supple. No JVD. No tracheal deviation. CVS: RRR, S1/S2 +, 3/6 SEM, no gallops, no carotid bruit.  Pulmonary: Effort and breath sounds normal, no stridor, rhonchi, wheezes, rales.  Abdominal: Soft. BS +,  Severe distension and generalized tenderness, NO rebound or guarding.  Musculoskeletal: Normal range of motion. No edema and no tenderness.  Neuro: Alert. CN 2-12 grossly intact. No  focal deficits. Skin: Skin is warm and dry. No rash noted. Psychiatric: Normal mood and affect.      LABORATORY PANEL:   CBC  Recent Labs Lab 01/08/17 0524  WBC 8.6  HGB 8.5*  HCT 26.8*  PLT 414   ------------------------------------------------------------------------------------------------------------------  Chemistries   Recent Labs Lab 01/08/17 0524  NA 138  K 4.8  CL 99*  CO2 26  GLUCOSE 120*  BUN 33*  CREATININE 6.59*  CALCIUM 8.4*  AST 20  ALT 16*  ALKPHOS 117  BILITOT 0.7   ------------------------------------------------------------------------------------------------------------------  Cardiac Enzymes  Recent Labs Lab 01/05/17 1357  TROPONINI 0.05*   ------------------------------------------------------------------------------------------------------------------  RADIOLOGY:  Dg Chest Port 1 View  Result Date: 01/07/2017 CLINICAL DATA:  Shortness of breath.  Dialysis patient. EXAM: PORTABLE CHEST 1 VIEW COMPARISON:  12/20/2016 FINDINGS: Mild fullness in the central vascular structures without overt pulmonary edema. Heart size is upper limits of normal and stable. Prior median sternotomy. No large pleural effusions. Subtle lucency underneath the right hemidiaphragm. IMPRESSION: Mild vascular congestion without overt pulmonary edema. Subtle lucency underneath the right hemidiaphragm is nonspecific. Difficult to exclude a small amount of intraperitoneal air. Recommend dedicated abdominal images with a decubitus view for further characterization. Electronically Signed   By: [name] M.D.   On: 01/07/2017 13:18     ASSESSMENT AND PLAN:   56 year old male with history of end-stage renal disease on hemodialysis, chronic iron deficiency anemia, liver cirrhosis from EtOH chronic GI blood loss due to small bowel angiectasia, polysubstance abuse and noncompliance who presents to the ER with abdominal pain, melena and shortness of breath. Patient did  leave AMA on October 13.   1. Abdominal pain from severe abdominal distention from ascites: Plan for  diagnostic and therapeutic centesis tomorrow Continue Rocephin due to ascites and GI bleed  2. Upper GI bleed with acute on chronic blood loss anemia: Patient has a history of small bowel AVMs and has been requested to follow-up with [facility] for small bowel enteroscopy however he has not done so. He had colonoscopy which showed nonbleeding AVMs in 2018 which were ablated and enteroscopy at that time which shows nonbleeding duodenal AVMs  He underwent EGD in the past which showed gastritis   He is status post 2 units. Hemoglobin is stable  Continue PPI IV twice a day Appreciate GI consultation Patient will need outpatient follow-up with [facility] for small bowel enteroscopy  3. [name]: Dialysis planned for Tuesday, Thursday and Saturday He underwent dialysis yesterday Continue Procrit with dialysis  4.Tobacco dependence: Patient is encouraged to quit smoking. Counseling was provided for 4 minutes.  5. CAD: Continue ASA, Statin, labetolol 6. Chronic diastolic heart failure with pEF: Continue Lasix  7. Peripheral neuropathy: Continue Neurontin Management plans discussed with the patient and he is in agreement.  CODE STATUS: FULL  TOTAL TIME TAKING CARE OF THIS PATIENT: 30 minutes.     POSSIBLE D/C 2-3 days, DEPENDING ON CLINICAL CONDITION.   Tailyn Hantz M.D on 01/08/2017 at 9:10 AM  Between 7am to 6pm - Pager - [phone] After 6pm go to [email or web address] - password [email or web address]  Office  [phone]  CC: Primary care physician; [name], MD  Note: This dictation was prepared with Dragon dictation along with smaller phrase technology. Any transcriptional errors that result from this process are unintentional.

## 2017-01-08 NOTE — Progress Notes (Signed)
HD TX ended  

## 2017-01-08 NOTE — Progress Notes (Signed)
[facility], [place] 01/08/17  Subjective:   Patient known to our practice from outpatient and previous admissions 2500 cc of fluid was removed with dialysis yesterday.  Patient was placed on clear liquid diet. He states that his abdomen is still very distended and uncomfortable.  He is requesting an extra dialysis treatment  Objective:  Vital signs in last 24 hours:  Temp:  [97.8 F (36.6 C)-99 F (37.2 C)] 97.8 F (36.6 C) (10/28 1256) Pulse Rate:  [80-93] 80 (10/28 1256) Resp:  [16-22] 16 (10/27 2003) BP: (115-148)/(64-89) 115/73 (10/28 1256) SpO2:  [94 %-100 %] 100 % (10/28 1256) Weight:  [84.4 kg (186 lb 1.1 oz)-84.9 kg (187 lb 2.7 oz)] 84.4 kg (186 lb 1.1 oz) (10/28 0525)  Weight change:  Filed Weights   01/07/17 1153 01/07/17 1650 01/08/17 0525  Weight: 80.7 kg (178 lb) 84.9 kg (187 lb 2.7 oz) 84.4 kg (186 lb 1.1 oz)    Intake/Output:    Intake/Output Summary (Last 24 hours) at 01/08/17 1434 Last data filed at 01/08/17 1008  Gross per 24 hour  Intake             1457 ml  Output             2500 ml  Net            -1043 ml     Physical Exam: General:  Laying in the bed no acute distress  HEENT  anicteric, moist oral mucous membranes  Neck  distended neck veins  Pulm/lungs  normal breathing effort on room air  CVS/Heart  tachycardic, prominent systolic murmur  Abdomen:   tense ascites  Extremities:  No peripheral edema  Neurologic:  Alert, oriented  Skin:  No acute rashes  Access:  Right upper arm AV fistula       Basic Metabolic Panel:   Recent Labs Lab 01/05/17 1357 01/07/17 1152 01/07/17 1745 01/08/17 0524  NA 140 140  --  138  K 4.3 5.1  --  4.8  CL 94* 98*  --  99*  CO2 32 30  --  26  GLUCOSE 94 130*  --  120*  BUN 29* 52*  --  33*  CREATININE 6.07* 9.08*  --  6.59*  CALCIUM 8.8* 8.8*  --  8.4*  PHOS  --   --  3.2  --      CBC:  Recent Labs Lab 01/05/17 1357 01/07/17 1152 01/07/17 2121 01/08/17 0524   WBC 6.9 7.7  --  8.6  NEUTROABS 5.3 5.9  --   --   HGB 7.9* 6.6* 8.3* 8.5*  HCT 26.5* 22.1* 25.4* 26.8*  MCV 80.9 81.0  --  81.0  PLT 461* 416  --  414      Lab Results  Component Value Date   HEPBSAG Negative 11/26/2016   HEPBSAB Reactive 06/01/2016   HEPBIGM Negative 06/01/2016      Microbiology:  Recent Results (from the past 240 hour(s))  MRSA PCR Screening     Status: None   Collection Time: 01/07/17  3:20 PM  Result Value Ref Range Status   MRSA by PCR NEGATIVE NEGATIVE Final    Comment:        The GeneXpert MRSA Assay (FDA approved for NASAL specimens only), is one component of a comprehensive MRSA colonization surveillance program. It is not intended to diagnose MRSA infection nor to guide or monitor treatment for MRSA infections.     Coagulation Studies:  Recent Labs  01/07/17 1322 01/08/17 0524  LABPROT 14.1 14.4  INR 1.10 1.13    Urinalysis: No results for input(s): COLORURINE, LABSPEC, PHURINE, GLUCOSEU, HGBUR, BILIRUBINUR, KETONESUR, PROTEINUR, UROBILINOGEN, NITRITE, LEUKOCYTESUR in the last 72 hours.  Invalid input(s): APPERANCEUR    Imaging: Dg Chest Port 1 View  Result Date: 01/07/2017 CLINICAL DATA:  Shortness of breath.  Dialysis patient. EXAM: PORTABLE CHEST 1 VIEW COMPARISON:  12/20/2016 FINDINGS: Mild fullness in the central vascular structures without overt pulmonary edema. Heart size is upper limits of normal and stable. Prior median sternotomy. No large pleural effusions. Subtle lucency underneath the right hemidiaphragm. IMPRESSION: Mild vascular congestion without overt pulmonary edema. Subtle lucency underneath the right hemidiaphragm is nonspecific. Difficult to exclude a small amount of intraperitoneal air. Recommend dedicated abdominal images with a decubitus view for further characterization. Electronically Signed   By: [name] M.D.   On: 01/07/2017 13:18     Medications:   . sodium chloride    . cefTRIAXone  (ROCEPHIN)  IV Stopped (01/08/17 0600)   . aspirin EC  81 mg Oral Daily  . atorvastatin  40 mg Oral Daily  . diphenhydrAMINE  25 mg Intravenous Once  . docusate sodium  100 mg Oral BID  . [START ON 01/10/2017] epoetin (EPOGEN/PROCRIT) injection  4,000 Units Intravenous Q T,Th,Sa-HD  . furosemide  80 mg Oral Daily  . gabapentin  300 mg Oral BID  . ipratropium-albuterol  3 mL Nebulization TID  . mometasone-formoterol  2 puff Inhalation BID  . nicotine  21 mg Transdermal Daily  . pantoprazole (PROTONIX) IV  40 mg Intravenous Q12H  . sevelamer carbonate  2,400 mg Oral TID WC  . tiotropium  18 mcg Inhalation Daily   acetaminophen **OR** acetaminophen, albuterol, bisacodyl, diphenhydrAMINE, heparin, heparin, LORazepam, morphine injection, nitroGLYCERIN, ondansetron **OR** ondansetron (ZOFRAN) IV  Assessment/ Plan:  56 y.o. male with end stage renal disease on hemodialysis secondary to Alport's syndrome, end stage liver disease with hepatic cirrhosis and ascites, hypertension, anemia of chronic kidney disease, coronary artery disease, peripheral vascular disease, hyperlipidemia, gastrointestinal AVMs  [facility] [place] AVF  1. End-stage renal disease. 2. Mild Hyperkalemia 3. Hypertension 4. Anemia of chronic kidney disease, anemia secondary to GI bleed as well. 5. Ascites secondary to end-stage liver disease and cirrhosis. 6. Secondary hyperparathyroidism.  Plan: 2500 cc removed with dialysis yesterday Extra dialysis treatment today for UF only treatment for volume removal for patient comfort He is scheduled for paracentesis tomorrow Potassium level has improved to 4.8 Albumin remains low at 2.5 Supplemental albumin with dialysis for volume removal   LOS: 1 Eliel Dudding 10/28/20182:34 PM  [place], [place]

## 2017-01-08 NOTE — Progress Notes (Signed)
Stanley Lame, MD [facility]   [address], [place], [place] [address] Phone: [phone] Fax : [phone]   Subjective: This patient reports that he is doing well today except that he does not want to be on clear liquids anymore for his diet.  He states he is so filled up with fluid of his ascites that he wants to be put on regular food.  The patient has been ordered to have a paracentesis.   Objective: Vital signs in last 24 hours: Vitals:   01/07/17 2035 01/07/17 2111 01/08/17 0525 01/08/17 0749  BP: 127/65  131/77   Pulse: 89  85   Resp:      Temp: 98.9 F (37.2 C)  98.2 F (36.8 C)   TempSrc: Oral  Oral   SpO2: 100% 94% 99% 96%  Weight:   186 lb 1.1 oz (84.4 kg)   Height:       Weight change:   Intake/Output Summary (Last 24 hours) at 01/08/17 1016 Last data filed at 01/08/17 1008  Gross per 24 hour  Intake             1457 ml  Output             2500 ml  Net            -1043 ml     Exam: Heart:: Regular rate and rhythm, S1S2 present or without murmur or extra heart sounds Lungs: Clear to auscultation and percussion. Abdomen: Distended with positive shifting dullness and fluid wave   Lab Results: @LABTEST2 @ Micro Results: Recent Results (from the past 240 hour(s))  MRSA PCR Screening     Status: None   Collection Time: 01/07/17  3:20 PM  Result Value Ref Range Status   MRSA by PCR NEGATIVE NEGATIVE Final    Comment:        The GeneXpert MRSA Assay (FDA approved for NASAL specimens only), is one component of a comprehensive MRSA colonization surveillance program. It is not intended to diagnose MRSA infection nor to guide or monitor treatment for MRSA infections.    Studies/Results: Dg Chest Port 1 View  Result Date: 01/07/2017 CLINICAL DATA:  Shortness of breath.  Dialysis patient. EXAM: PORTABLE CHEST 1 VIEW COMPARISON:  12/20/2016 FINDINGS: Mild fullness in the central vascular structures without overt pulmonary edema. Heart size is upper  limits of normal and stable. Prior median sternotomy. No large pleural effusions. Subtle lucency underneath the right hemidiaphragm. IMPRESSION: Mild vascular congestion without overt pulmonary edema. Subtle lucency underneath the right hemidiaphragm is nonspecific. Difficult to exclude a small amount of intraperitoneal air. Recommend dedicated abdominal images with a decubitus view for further characterization. Electronically Signed   By: [name] M.D.   On: 01/07/2017 13:18   Medications: I have reviewed the patient's current medications. Scheduled Meds: . aspirin EC  81 mg Oral Daily  . atorvastatin  40 mg Oral Daily  . diphenhydrAMINE  25 mg Intravenous Once  . docusate sodium  100 mg Oral BID  . [START ON 01/10/2017] epoetin (EPOGEN/PROCRIT) injection  4,000 Units Intravenous Q T,Th,Sa-HD  . furosemide  80 mg Oral Daily  . gabapentin  300 mg Oral BID  . ipratropium-albuterol  3 mL Nebulization QID  . labetalol  100 mg Oral Daily  . mometasone-formoterol  2 puff Inhalation BID  . nicotine  21 mg Transdermal Daily  . pantoprazole (PROTONIX) IV  40 mg Intravenous Q12H  . sevelamer carbonate  2,400 mg Oral TID WC  .  tiotropium  18 mcg Inhalation Daily   Continuous Infusions: . sodium chloride    . cefTRIAXone (ROCEPHIN)  IV Stopped (01/08/17 0600)   PRN Meds:.acetaminophen **OR** acetaminophen, albuterol, bisacodyl, diphenhydrAMINE, heparin, heparin, LORazepam, morphine injection, nitroGLYCERIN, ondansetron **OR** ondansetron (ZOFRAN) IV   Assessment: Principal Problem:   Upper GI bleed Active Problems:   ESRD on dialysis (HCC)   Ascites   GI bleed   Abdominal pain   Anemia due to chronic blood loss    Plan: This patient is here with chronic anemia and obscure GI bleeding.  The patient was supposed to undergo a small bowel  endoscopy at [facility] but has not followed up with that.  The patient is scheduled to have a paracentesis to relieve some the pressure in his abdomen.  The  patient will also be started on a soft diet.   LOS: 1 day   Tecia Cinnamon 01/08/2017, 10:16 AM

## 2017-01-09 ENCOUNTER — Inpatient Hospital Stay: Payer: Medicare Other

## 2017-01-09 LAB — TYPE AND SCREEN
ABO/RH(D): A POS
Antibody Screen: NEGATIVE
Unit division: 0
Unit division: 0

## 2017-01-09 LAB — BODY FLUID CELL COUNT WITH DIFFERENTIAL
Lymphs, Fluid: 8 %
Monocyte-Macrophage-Serous Fluid: 87 %
NEUTROPHIL FLUID: 5 %
WBC FLUID: 156 uL

## 2017-01-09 LAB — BASIC METABOLIC PANEL
ANION GAP: 11 (ref 5–15)
BUN: 31 mg/dL — ABNORMAL HIGH (ref 6–20)
CO2: 27 mmol/L (ref 22–32)
Calcium: 8.6 mg/dL — ABNORMAL LOW (ref 8.9–10.3)
Chloride: 98 mmol/L — ABNORMAL LOW (ref 101–111)
Creatinine, Ser: 7.18 mg/dL — ABNORMAL HIGH (ref 0.61–1.24)
GFR calc non Af Amer: 8 mL/min — ABNORMAL LOW (ref >60)
GFR, EST AFRICAN AMERICAN: 9 mL/min — AB (ref >60)
GLUCOSE: 109 mg/dL — AB (ref 65–99)
POTASSIUM: 5.2 mmol/L — AB (ref 3.5–5.1)
Sodium: 136 mmol/L (ref 135–145)

## 2017-01-09 LAB — CBC
HEMATOCRIT: 29.7 % — AB (ref 40.0–52.0)
HEMOGLOBIN: 9.3 g/dL — AB (ref 13.0–18.0)
MCH: 25.6 pg — ABNORMAL LOW (ref 26.0–34.0)
MCHC: 31.5 g/dL — ABNORMAL LOW (ref 32.0–36.0)
MCV: 81.2 fL (ref 80.0–100.0)
Platelets: 417 10*3/uL (ref 150–440)
RBC: 3.65 MIL/uL — AB (ref 4.40–5.90)
RDW: 20.2 % — ABNORMAL HIGH (ref 11.5–14.5)
WBC: 8.7 10*3/uL (ref 3.8–10.6)

## 2017-01-09 LAB — BPAM RBC
BLOOD PRODUCT EXPIRATION DATE: 201811192359
Blood Product Expiration Date: 201811192359
ISSUE DATE / TIME: 201810271825
ISSUE DATE / TIME: 201810271731
Unit Type and Rh: 6200
Unit Type and Rh: 6200

## 2017-01-09 LAB — PATHOLOGIST SMEAR REVIEW

## 2017-01-09 LAB — GLUCOSE, CAPILLARY: Glucose-Capillary: 102 mg/dL — ABNORMAL HIGH (ref 65–99)

## 2017-01-09 MED ORDER — OXYCODONE HCL 5 MG PO TABS
5.0000 mg | ORAL_TABLET | Freq: Four times a day (QID) | ORAL | Status: DC | PRN
  Administered 2017-01-10 – 2017-01-11 (×3): 5 mg via ORAL
  Filled 2017-01-09 (×4): qty 1

## 2017-01-09 MED ORDER — GABAPENTIN 300 MG PO CAPS: 300.0000 mg | ORAL_CAPSULE | Freq: Every day | ORAL | Status: DC

## 2017-01-09 MED ORDER — GABAPENTIN 300 MG PO CAPS
300.0000 mg | ORAL_CAPSULE | Freq: Every day | ORAL | Status: DC
  Administered 2017-01-09 – 2017-01-12 (×4): 300 mg via ORAL
  Filled 2017-01-09 (×4): qty 1

## 2017-01-09 MED ORDER — IPRATROPIUM-ALBUTEROL 0.5-2.5 (3) MG/3ML IN SOLN: 3.0000 mL | Freq: Four times a day (QID) | RESPIRATORY_TRACT | Status: DC | PRN

## 2017-01-09 NOTE — Progress Notes (Signed)
HD completed. No UF as ordered. Patient tolerated well without issue. Report called to primary RN.

## 2017-01-09 NOTE — Progress Notes (Signed)
Pre-Dialysis assessment. 

## 2017-01-09 NOTE — Care Management (Signed)
Amanda Morris HD liaison notified of admission.  

## 2017-01-09 NOTE — Progress Notes (Signed)
Post hd 

## 2017-01-09 NOTE — Progress Notes (Signed)
HD initiated via R AVF without issue. Patient had 9L removed with paracentesis today. Per Dr. Holley Raring, change orders to 3 hours with 0 UF due to procedure. Done. Continue to monitor.

## 2017-01-09 NOTE — Progress Notes (Signed)
[place], [place] 01/09/17  Subjective:  We have planned another dialysis session for the patient as potassium a bit high at 5.2. Patient also going for paracentesis today.   Objective:  Vital signs in last 24 hours:  Temp:  [97.8 F (36.6 C)-98.2 F (36.8 C)] 98.2 F (36.8 C) (10/29 1216) Pulse Rate:  [80-92] 80 (10/29 1216) Resp:  [12-24] 16 (10/29 1216) BP: (106-137)/(61-96) 119/66 (10/29 1216) SpO2:  [85 %-100 %] 99 % (10/29 1216) Weight:  [79.7 kg (175 lb 9.6 oz)-87.8 kg (193 lb 9 oz)] 79.7 kg (175 lb 9.6 oz) (10/29 0414)  Weight change: 7.06 kg (15 lb 9 oz) Filed Weights   01/08/17 1527 01/08/17 1800 01/09/17 0414  Weight: 87.8 kg (193 lb 9 oz) 84 kg (185 lb 3 oz) 79.7 kg (175 lb 9.6 oz)    Intake/Output:    Intake/Output Summary (Last 24 hours) at 01/09/17 1224 Last data filed at 01/09/17 1007  Gross per 24 hour  Intake              580 ml  Output             3000 ml  Net            -2420 ml     Physical Exam: General:  Laying in the bed   HEENT  anicteric, moist oral mucous membranes  Neck  distended neck veins  Pulm/lungs  Clear to auscultation bilateral   CVS/Heart  S1S2 no rubs  Abdomen:   ascites present  Extremities:  No peripheral edema  Neurologic:  Alert, oriented, conversant  Skin:  No acute rashes  Access:  Right upper arm AV fistula       Basic Metabolic Panel:   Recent Labs Lab 01/05/17 1357 01/07/17 1152 01/07/17 1745 01/08/17 0524 01/09/17 0534  NA 140 140  --  138 136  K 4.3 5.1  --  4.8 5.2*  CL 94* 98*  --  99* 98*  CO2 32 30  --  26 27  GLUCOSE 94 130*  --  120* 109*  BUN 29* 52*  --  33* 31*  CREATININE 6.07* 9.08*  --  6.59* 7.18*  CALCIUM 8.8* 8.8*  --  8.4* 8.6*  PHOS  --   --  3.2  --   --      CBC:  Recent Labs Lab 01/05/17 1357 01/07/17 1152 01/07/17 2121 01/08/17 0524 01/09/17 0534  WBC 6.9 7.7  --  8.6 8.7  NEUTROABS 5.3 5.9  --   --   --   HGB 7.9* 6.6* 8.3* 8.5*  9.3*  HCT 26.5* 22.1* 25.4* 26.8* 29.7*  MCV 80.9 81.0  --  81.0 81.2  PLT 461* 416  --  414 417      Lab Results  Component Value Date   HEPBSAG Negative 11/26/2016   HEPBSAB Reactive 06/01/2016   HEPBIGM Negative 06/01/2016      Microbiology:  Recent Results (from the past 240 hour(s))  MRSA PCR Screening     Status: None   Collection Time: 01/07/17  3:20 PM  Result Value Ref Range Status   MRSA by PCR NEGATIVE NEGATIVE Final    Comment:        The GeneXpert MRSA Assay (FDA approved for NASAL specimens only), is one component of a comprehensive MRSA colonization surveillance program. It is not intended to diagnose MRSA infection nor to guide or monitor treatment for MRSA infections.  Coagulation Studies:  Recent Labs  01/07/17 1322 01/08/17 0524  LABPROT 14.1 14.4  INR 1.10 1.13    Urinalysis: No results for input(s): COLORURINE, LABSPEC, PHURINE, GLUCOSEU, HGBUR, BILIRUBINUR, KETONESUR, PROTEINUR, UROBILINOGEN, NITRITE, LEUKOCYTESUR in the last 72 hours.  Invalid input(s): APPERANCEUR    Imaging: US Paracentesis  Result Date: 01/09/2017 INDICATION: Recurrent ascites EXAM: ULTRASOUND GUIDED THERAPEUTIC PARACENTESIS MEDICATIONS: None. COMPLICATIONS: None immediate. PROCEDURE: Informed written consent was obtained from the patient after a discussion of the risks, benefits and alternatives to treatment. A timeout was performed prior to the initiation of the procedure. Initial ultrasound scanning demonstrates a large amount of ascites within the right abdomen The right abdomen was prepped and draped in the usual sterile fashion. 1% lidocaine was used for local anesthesia. Following this, a 6 Fr Safe-T-Centesis catheter was introduced. An ultrasound image was saved for documentation purposes. The paracentesis was performed. The catheter was removed and a dressing was applied. The patient tolerated the procedure well without immediate post procedural  complication. FINDINGS: A total of approximately 9 L of clear yellow fluid was removed. Samples were sent to the laboratory as requested by the clinical team. IMPRESSION: Successful ultrasound-guided paracentesis yielding 9 liters of peritoneal fluid. Electronically Signed   By: Inez Catalina M.D.   On: 01/09/2017 11:58   Dg Chest Port 1 View  Result Date: 01/07/2017 CLINICAL DATA:  Shortness of breath.  Dialysis patient. EXAM: PORTABLE CHEST 1 VIEW COMPARISON:  12/20/2016 FINDINGS: Mild fullness in the central vascular structures without overt pulmonary edema. Heart size is upper limits of normal and stable. Prior median sternotomy. No large pleural effusions. Subtle lucency underneath the right hemidiaphragm. IMPRESSION: Mild vascular congestion without overt pulmonary edema. Subtle lucency underneath the right hemidiaphragm is nonspecific. Difficult to exclude a small amount of intraperitoneal air. Recommend dedicated abdominal images with a decubitus view for further characterization. Electronically Signed   By: [name] M.D.   On: 01/07/2017 13:18     Medications:   . sodium chloride    . albumin human    . cefTRIAXone (ROCEPHIN)  IV Stopped (01/09/17 0550)   . aspirin EC  81 mg Oral Daily  . atorvastatin  40 mg Oral Daily  . diphenhydrAMINE  25 mg Intravenous Once  . docusate sodium  100 mg Oral BID  . [START ON 01/10/2017] epoetin (EPOGEN/PROCRIT) injection  4,000 Units Intravenous Q T,Th,Sa-HD  . furosemide  80 mg Oral Daily  . gabapentin  300 mg Oral BID  . ipratropium-albuterol  3 mL Nebulization TID  . mometasone-formoterol  2 puff Inhalation BID  . nicotine  21 mg Transdermal Daily  . pantoprazole (PROTONIX) IV  40 mg Intravenous Q12H  . sevelamer carbonate  2,400 mg Oral TID WC  . tiotropium  18 mcg Inhalation Daily   acetaminophen **OR** acetaminophen, albuterol, bisacodyl, diphenhydrAMINE, LORazepam, morphine injection, nitroGLYCERIN, ondansetron **OR** ondansetron  (ZOFRAN) IV  Assessment/ Plan:  56 y.o. male with end stage renal disease on hemodialysis secondary to Alport's syndrome, end stage liver disease with hepatic cirrhosis and ascites, hypertension, anemia of chronic kidney disease, coronary artery disease, peripheral vascular disease, hyperlipidemia, gastrointestinal AVMs  [facility] AVF  1. End-stage renal disease. 2. Mild Hyperkalemia 3. Hypertension 4. Anemia of chronic kidney disease, anemia secondary to GI bleed as well. 5. Ascites secondary to end-stage liver disease and cirrhosis. 6. Secondary hyperparathyroidism.  Plan: Potassium admit high today at 5.2. Therefore we will plan for another dialysis session today. We will not  perform any ultrafiltration as he is also having a paracentesis today. Thereafter resume normal Tuesday, Thursday, Saturday dialysis schedule. Case discussed with hospitalist in depth today.   LOS: 2 Stanley [name] 10/29/201812:24 PM  [facility] [place], [place]

## 2017-01-09 NOTE — Care Management Important Message (Signed)
Important Message  Patient Details  Name: Stanley [name] MRN: [id number] Date of Birth: [birth year]   Medicare Important Message Given:  Yes    Beverly Sessions, RN 01/09/2017, 4:56 PM

## 2017-01-09 NOTE — Progress Notes (Signed)
[place] [facility] [place] NAME: Stanley [name]    MR#:  [id number]  DATE OF BIRTH:  11/19/1960  SUBJECTIVE:   Still abdominal pain and distention. REVIEW OF SYSTEMS:    Review of Systems  Constitutional: Negative for fever, chills weight loss HENT: Negative for ear pain, nosebleeds, congestion, facial swelling, rhinorrhea, neck pain, neck stiffness and ear discharge.   Respiratory: Negative for cough, shortness of breath, wheezing  Cardiovascular: Negative for chest pain, palpitations and leg swelling.  Gastrointestinal: Positive for dark stools and abdominal pain and distention  Genitourinary: Negative for dysuria, urgency, frequency, hematuria Musculoskeletal: Negative for back pain or joint pain Neurological: Negative for dizziness, seizures, syncope, focal weakness,  numbness and headaches.  Hematological: Does not bruise/bleed easily.   DRUG ALLERGIES:  No Known Allergies  VITALS:  Blood pressure 119/66, pulse 80, temperature 98.2 F (36.8 C), resp. rate 16, height 6\' 3"  (1.905 m), weight 175 lb 9.6 oz (79.7 kg), SpO2 99 %.  PHYSICAL EXAMINATION:  Constitutional: Appears well-developed and well-nourished. No distress. HENT: Normocephalic. Marland Kitchen Oropharynx is clear and moist.  Eyes: Conjunctivae and EOM are normal. PERRLA, no scleral icterus.  Neck: Normal ROM. Neck supple. No JVD. No tracheal deviation. CVS: RRR, S1/S2 +, 3/6 SEM, no gallops, no carotid bruit.  Pulmonary: Effort and breath sounds normal, no stridor, rhonchi, wheezes, but has bibasilar rales.  Abdominal: Soft. BS +,  Severe distension and generalized tenderness, NO rebound or guarding.  Musculoskeletal: Normal range of motion. No edema and no tenderness.  Neuro: Alert. CN 2-12 grossly intact. No focal deficits. Skin: Skin is warm and dry. No rash noted. Psychiatric: Normal mood and affect.   LABORATORY PANEL:   CBC  Recent Labs Lab 01/09/17 0534  WBC 8.7  HGB 9.3*   HCT 29.7*  PLT 417   ------------------------------------------------------------------------------------------------------------------  Chemistries   Recent Labs Lab 01/08/17 0524 01/09/17 0534  NA 138 136  K 4.8 5.2*  CL 99* 98*  CO2 26 27  GLUCOSE 120* 109*  BUN 33* 31*  CREATININE 6.59* 7.18*  CALCIUM 8.4* 8.6*  AST 20  --   ALT 16*  --   ALKPHOS 117  --   BILITOT 0.7  --    ------------------------------------------------------------------------------------------------------------------  Cardiac Enzymes  Recent Labs Lab 01/05/17 1357  TROPONINI 0.05*   ------------------------------------------------------------------------------------------------------------------  RADIOLOGY:  US Paracentesis  Result Date: 01/09/2017 INDICATION: Recurrent ascites EXAM: ULTRASOUND GUIDED THERAPEUTIC PARACENTESIS MEDICATIONS: None. COMPLICATIONS: None immediate. PROCEDURE: Informed written consent was obtained from the patient after a discussion of the risks, benefits and alternatives to treatment. A timeout was performed prior to the initiation of the procedure. Initial ultrasound scanning demonstrates a large amount of ascites within the right abdomen The right abdomen was prepped and draped in the usual sterile fashion. 1% lidocaine was used for local anesthesia. Following this, a 6 Fr Safe-T-Centesis catheter was introduced. An ultrasound image was saved for documentation purposes. The paracentesis was performed. The catheter was removed and a dressing was applied. The patient tolerated the procedure well without immediate post procedural complication. FINDINGS: A total of approximately 9 L of clear yellow fluid was removed. Samples were sent to the laboratory as requested by the clinical team. IMPRESSION: Successful ultrasound-guided paracentesis yielding 9 liters of peritoneal fluid. Electronically Signed   By: Inez Catalina M.D.   On: 01/09/2017 11:58     ASSESSMENT AND PLAN:    56 year old male with history of end-stage renal disease on hemodialysis, chronic  iron deficiency anemia, liver cirrhosis from EtOH chronic GI blood loss due to small bowel angiectasia, polysubstance abuse and noncompliance who presents to the ER with abdominal pain, melena and shortness of breath. Patient did leave AMA on October 13.   1. Abdominal pain from severe abdominal distention from ascites: Diagnostic and therapeutic paracentesis with 9 L fluid today. Continue Rocephin due to ascites and GI bleed  2. Upper GI bleed with acute on chronic blood loss anemia: Patient has a history of small bowel AVMs and has been requested to follow-up with [facility] for small bowel enteroscopy however he has not done so. He had colonoscopy which showed nonbleeding AVMs in 2018 which were ablated and enteroscopy at that time which shows nonbleeding duodenal AVMs  He underwent EGD in the past which showed gastritis   He is status post 2 units. Hemoglobin is stable  Continue PPI IV twice a day Appreciate GI consultation Patient will need outpatient follow-up with [facility] for small bowel enteroscopy  3. [name]: Dialysis planned for Tuesday, Thursday and Saturday HD today due to hyperkalemia per Dr. Holley Raring.  Hyperkalemia. Dialysis today.  4.Tobacco dependence: Patient is encouraged to quit smoking. Counseling was provided for 4 minutes.  5. CAD: Continue ASA, Statin, labetolol 6. Chronic diastolic heart failure with pEF: Continue Lasix  7. Peripheral neuropathy: Continue Neurontin Management plans discussed with the patient and he is in agreement.  CODE STATUS: FULL  TOTAL TIME TAKING CARE OF THIS PATIENT: 35 minutes.   Discussed with Dr. Holley Raring.  POSSIBLE D/C 2 days, DEPENDING ON CLINICAL CONDITION.   Demetrios Loll M.D on 01/09/2017 at 2:10 PM  Between 7am to 6pm - Pager - [phone] After 6pm go to [email or web address] - password [email or web address]  [facility]  Office  [phone]  CC: Primary care physician; [name], MD  Note: This dictation was prepared with Dragon dictation along with smaller phrase technology. Any transcriptional errors that result from this process are unintentional.

## 2017-01-09 NOTE — Progress Notes (Signed)
Patient returned from US/paracentesis.  Abdomen soft/tender

## 2017-01-10 LAB — GLUCOSE, CAPILLARY: GLUCOSE-CAPILLARY: 106 mg/dL — AB (ref 65–99)

## 2017-01-10 LAB — BASIC METABOLIC PANEL
Anion gap: 9 (ref 5–15)
BUN: 17 mg/dL (ref 6–20)
CHLORIDE: 96 mmol/L — AB (ref 101–111)
CO2: 29 mmol/L (ref 22–32)
CREATININE: 5.43 mg/dL — AB (ref 0.61–1.24)
Calcium: 7.7 mg/dL — ABNORMAL LOW (ref 8.9–10.3)
GFR calc Af Amer: 12 mL/min — ABNORMAL LOW (ref >60)
GFR, EST NON AFRICAN AMERICAN: 11 mL/min — AB (ref >60)
Glucose, Bld: 101 mg/dL — ABNORMAL HIGH (ref 65–99)
Potassium: 4.4 mmol/L (ref 3.5–5.1)
SODIUM: 134 mmol/L — AB (ref 135–145)

## 2017-01-10 LAB — PHOSPHORUS: Phosphorus: 5.1 mg/dL — ABNORMAL HIGH (ref 2.5–4.6)

## 2017-01-10 MED ORDER — LACTULOSE 10 GM/15ML PO SOLN
20.0000 g | Freq: Three times a day (TID) | ORAL | Status: DC
  Administered 2017-01-10 (×2): 20 g via ORAL
  Filled 2017-01-10 (×2): qty 30

## 2017-01-10 NOTE — Progress Notes (Signed)
Post HD  

## 2017-01-10 NOTE — Discharge Instructions (Signed)
Renal diet. Follow up [place].

## 2017-01-10 NOTE — Progress Notes (Signed)
Pre HD assessment  

## 2017-01-10 NOTE — Progress Notes (Signed)
HD completed without issue. Total UF 1.5L as ordered. Patient tolerated well. Currently without complaints.

## 2017-01-10 NOTE — Progress Notes (Signed)
HD initiated via R AVF without issue using 15g needles x2. Patient currently without complaints. Medicated for pain prior to arrival to HD. 2K bath. 1.5L goal as ordered. Continue to monitor.

## 2017-01-10 NOTE — Progress Notes (Signed)
Pre hd 

## 2017-01-10 NOTE — Progress Notes (Signed)
Stanley [name] at [place] NAME: Stanley [name]    MR#:  [id number]  DATE OF BIRTH:  [birth year]  SUBJECTIVE:   S/p HD just now, he feels dizzy, generalized weakness,  abdominal pain and distention. No BM 6 days per pt. REVIEW OF SYSTEMS:    Review of Systems  Constitutional: Negative for fever, chills weight loss HENT: Negative for ear pain, nosebleeds, congestion, facial swelling, rhinorrhea, neck pain, neck stiffness and ear discharge.   Respiratory: Negative for cough, shortness of breath, wheezing  Cardiovascular: Negative for chest pain, palpitations and leg swelling.  Gastrointestinal: Positive for dark stools and abdominal pain and distention  Genitourinary: Negative for dysuria, urgency, frequency, hematuria Musculoskeletal: Negative for back pain or joint pain Neurological: Negative for dizziness, seizures, syncope, focal weakness,  numbness and headaches.  Hematological: Does not bruise/bleed easily.   DRUG ALLERGIES:  No Known Allergies  VITALS:  Blood pressure 128/65, pulse 90, temperature 98 F (36.7 C), temperature source Oral, resp. rate 20, height 6\' 3"  (1.905 m), weight 157 lb 6.5 oz (71.4 kg), SpO2 97 %.  PHYSICAL EXAMINATION:  Constitutional: Appears well-developed and well-nourished. No distress. HENT: Normocephalic. Marland Kitchen Oropharynx is clear and moist.  Eyes: Conjunctivae and EOM are normal. PERRLA, no scleral icterus.  Neck: Normal ROM. Neck supple. No JVD. No tracheal deviation. CVS: RRR, S1/S2 +, 3/6 SEM, no gallops, no carotid bruit.  Pulmonary: Effort and breath sounds normal, no stridor, rhonchi, wheezes, but has bibasilar rales.  Abdominal: Soft. BS +,  highly distension and generalized tenderness, NO rebound or guarding.  Musculoskeletal: Normal range of motion. No edema and no tenderness.  Neuro: Alert. CN 2-12 grossly intact. No focal deficits. Skin: Skin is warm and dry. No rash noted. Psychiatric: Normal mood and  affect.   LABORATORY PANEL:   CBC  Recent Labs Lab 01/09/17 0534  WBC 8.7  HGB 9.3*  HCT 29.7*  PLT 417   ------------------------------------------------------------------------------------------------------------------  Chemistries   Recent Labs Lab 01/08/17 0524  01/10/17 0354  NA 138  < > 134*  K 4.8  < > 4.4  CL 99*  < > 96*  CO2 26  < > 29  GLUCOSE 120*  < > 101*  BUN 33*  < > 17  CREATININE 6.59*  < > 5.43*  CALCIUM 8.4*  < > 7.7*  AST 20  --   --   ALT 16*  --   --   ALKPHOS 117  --   --   BILITOT 0.7  --   --   < > = values in this interval not displayed. ------------------------------------------------------------------------------------------------------------------  Cardiac Enzymes  Recent Labs Lab 01/05/17 1357  TROPONINI 0.05*   ------------------------------------------------------------------------------------------------------------------  RADIOLOGY:  US Paracentesis  Result Date: 01/09/2017 INDICATION: Recurrent ascites EXAM: ULTRASOUND GUIDED THERAPEUTIC PARACENTESIS MEDICATIONS: None. COMPLICATIONS: None immediate. PROCEDURE: Informed written consent was obtained from the patient after a discussion of the risks, benefits and alternatives to treatment. A timeout was performed prior to the initiation of the procedure. Initial ultrasound scanning demonstrates a large amount of ascites within the right abdomen The right abdomen was prepped and draped in the usual sterile fashion. 1% lidocaine was used for local anesthesia. Following this, a 6 Fr Safe-T-Centesis catheter was introduced. An ultrasound image was saved for documentation purposes. The paracentesis was performed. The catheter was removed and a dressing was applied. The patient tolerated the procedure well without immediate post procedural complication. FINDINGS: A total of approximately 9 L  of clear yellow fluid was removed. Samples were sent to the laboratory as requested by the clinical  team. IMPRESSION: Successful ultrasound-guided paracentesis yielding 9 liters of peritoneal fluid. Electronically Signed   By: Inez Catalina M.D.   On: 01/09/2017 11:58     ASSESSMENT AND PLAN:   56 year old male with history of end-stage renal disease on hemodialysis, chronic iron deficiency anemia, liver cirrhosis from EtOH chronic GI blood loss due to small bowel angiectasia, polysubstance abuse and noncompliance who presents to the ER with abdominal pain, melena and shortness of breath. Patient did leave AMA on October 13.   1. Abdominal pain from severe abdominal distention from ascites due to liver cirrhosis: Diagnostic and therapeutic paracentesis with 9 L fluid. Continue Rocephin due to ascites and GI bleed No BM, Start lactulose.  2. Upper GI bleed with acute on chronic blood loss anemia: Patient has a history of small bowel AVMs and has been requested to follow-up with [facility] for small bowel enteroscopy however he has not done so. He had colonoscopy which showed nonbleeding AVMs in 2018 which were ablated and enteroscopy at that time which shows nonbleeding duodenal AVMs  He underwent EGD in the past which showed gastritis   He is status post 2 units. Hemoglobin is stable  Continue PPI IV twice a day Appreciate GI consultation Patient will need outpatient follow-up with [facility] for small bowel enteroscopy  3. [name]: Dialysis planned for Tuesday, Thursday and Saturday HD today per Dr. Holley Raring.  Hyperkalemia. Improving with hemodialysis.  4.Tobacco dependence: Patient is encouraged to quit smoking. Counseling was provided for 4 minutes.  5. CAD: Continue ASA, Statin, labetolol 6. Chronic diastolic heart failure with pEF: Continue Lasix  7. Peripheral neuropathy: Continue Neurontin Management plans discussed with the patient and he is in agreement.  CODE STATUS: FULL  TOTAL TIME TAKING CARE OF THIS PATIENT: 35 minutes.    Discussed with Dr.  Holley Raring.  POSSIBLE D/C 2 days, DEPENDING ON CLINICAL CONDITION.   Demetrios Loll M.D on 01/10/2017 at 2:53 PM  Between 7am to 6pm - Pager - [phone] After 6pm go to [email or web address] - password [email or web address] [facility]  Office  [phone]  CC: Primary care physician; [name], MD  Note: This dictation was prepared with Dragon dictation along with smaller phrase technology. Any transcriptional errors that result from this process are unintentional.

## 2017-01-10 NOTE — Progress Notes (Signed)
[facility], [place] 01/10/17  Subjective:  Patient had hemodialysis performed today. He had paracentesis performed yesterday and 9 L was taken off. He will be stable at the moment and 9.3.  Objective:  Vital signs in last 24 hours:  Temp:  [98 F (36.7 C)-98.7 F (37.1 C)] 98 F (36.7 C) (10/30 1332) Pulse Rate:  [75-94] 90 (10/30 1332) Resp:  [8-22] 20 (10/30 1332) BP: (114-152)/(55-98) 128/65 (10/30 1332) SpO2:  [91 %-100 %] 97 % (10/30 1332) Weight:  [70.2 kg (154 lb 11.2 oz)-72.1 kg (158 lb 15.2 oz)] 71.4 kg (157 lb 6.5 oz) (10/30 1254)  Weight change: -17.628 kg (-38 lb 13.8 oz) Filed Weights   01/10/17 0500 01/10/17 0920 01/10/17 1254  Weight: 71.1 kg (156 lb 12 oz) 72.1 kg (158 lb 15.2 oz) 71.4 kg (157 lb 6.5 oz)    Intake/Output:    Intake/Output Summary (Last 24 hours) at 01/10/17 1359 Last data filed at 01/10/17 1254  Gross per 24 hour  Intake              410 ml  Output             1508 ml  Net            -1098 ml     Physical Exam: General:  Laying in the bed   HEENT  anicteric, moist oral mucous membranes  Neck  distended neck veins  Pulm/lungs  Clear to auscultation bilateral   CVS/Heart  S1S2 no rubs  Abdomen:   decreased distension  Extremities:  No peripheral edema  Neurologic:  Alert, oriented, conversant  Skin:  No acute rashes  Access:  Right upper arm AV fistula       Basic Metabolic Panel:   Recent Labs Lab 01/05/17 1357 01/07/17 1152 01/07/17 1745 01/08/17 0524 01/09/17 0534 01/10/17 0354 01/10/17 0937  NA 140 140  --  138 136 134*  --   K 4.3 5.1  --  4.8 5.2* 4.4  --   CL 94* 98*  --  99* 98* 96*  --   CO2 32 30  --  26 27 29   --   GLUCOSE 94 130*  --  120* 109* 101*  --   BUN 29* 52*  --  33* 31* 17  --   CREATININE 6.07* 9.08*  --  6.59* 7.18* 5.43*  --   CALCIUM 8.8* 8.8*  --  8.4* 8.6* 7.7*  --   PHOS  --   --  3.2  --   --   --  5.1*     CBC:  Recent Labs Lab 01/05/17 1357  01/07/17 1152 01/07/17 2121 01/08/17 0524 01/09/17 0534  WBC 6.9 7.7  --  8.6 8.7  NEUTROABS 5.3 5.9  --   --   --   HGB 7.9* 6.6* 8.3* 8.5* 9.3*  HCT 26.5* 22.1* 25.4* 26.8* 29.7*  MCV 80.9 81.0  --  81.0 81.2  PLT 461* 416  --  414 417      Lab Results  Component Value Date   HEPBSAG Negative 11/26/2016   HEPBSAB Reactive 06/01/2016   HEPBIGM Negative 06/01/2016      Microbiology:  Recent Results (from the past 240 hour(s))  MRSA PCR Screening     Status: None   Collection Time: 01/07/17  3:20 PM  Result Value Ref Range Status   MRSA by PCR NEGATIVE NEGATIVE Final    Comment:  The GeneXpert MRSA Assay (FDA approved for NASAL specimens only), is one component of a comprehensive MRSA colonization surveillance program. It is not intended to diagnose MRSA infection nor to guide or monitor treatment for MRSA infections.   Body fluid culture     Status: None (Preliminary result)   Collection Time: 01/09/17 11:10 AM  Result Value Ref Range Status   Specimen Description PERITONEAL  Final   Special Requests NONE  Final   Gram Stain   Final    FEW WBC PRESENT, PREDOMINANTLY MONONUCLEAR NO ORGANISMS SEEN    Culture   Final    NO GROWTH < 24 HOURS Performed at [facility], [address], [place], [place] [address]    Report Status PENDING  Incomplete    Coagulation Studies:  Recent Labs  01/08/17 0524  LABPROT 14.4  INR 1.13    Urinalysis: No results for input(s): COLORURINE, LABSPEC, PHURINE, GLUCOSEU, HGBUR, BILIRUBINUR, KETONESUR, PROTEINUR, UROBILINOGEN, NITRITE, LEUKOCYTESUR in the last 72 hours.  Invalid input(s): APPERANCEUR    Imaging: US Paracentesis  Result Date: 01/09/2017 INDICATION: Recurrent ascites EXAM: ULTRASOUND GUIDED THERAPEUTIC PARACENTESIS MEDICATIONS: None. COMPLICATIONS: None immediate. PROCEDURE: Informed written consent was obtained from the patient after a discussion of the risks, benefits and alternatives to  treatment. A timeout was performed prior to the initiation of the procedure. Initial ultrasound scanning demonstrates a large amount of ascites within the right abdomen The right abdomen was prepped and draped in the usual sterile fashion. 1% lidocaine was used for local anesthesia. Following this, a 6 Fr Safe-T-Centesis catheter was introduced. An ultrasound image was saved for documentation purposes. The paracentesis was performed. The catheter was removed and a dressing was applied. The patient tolerated the procedure well without immediate post procedural complication. FINDINGS: A total of approximately 9 L of clear yellow fluid was removed. Samples were sent to the laboratory as requested by the clinical team. IMPRESSION: Successful ultrasound-guided paracentesis yielding 9 liters of peritoneal fluid. Electronically Signed   By: Inez Catalina M.D.   On: 01/09/2017 11:58     Medications:   . sodium chloride    . albumin human    . cefTRIAXone (ROCEPHIN)  IV Stopped (01/10/17 0622)   . aspirin EC  81 mg Oral Daily  . atorvastatin  40 mg Oral Daily  . diphenhydrAMINE  25 mg Intravenous Once  . docusate sodium  100 mg Oral BID  . epoetin (EPOGEN/PROCRIT) injection  4,000 Units Intravenous Q T,Th,Sa-HD  . furosemide  80 mg Oral Daily  . gabapentin  300 mg Oral QHS  . lactulose  20 g Oral TID  . mometasone-formoterol  2 puff Inhalation BID  . nicotine  21 mg Transdermal Daily  . pantoprazole (PROTONIX) IV  40 mg Intravenous Q12H  . sevelamer carbonate  2,400 mg Oral TID WC  . tiotropium  18 mcg Inhalation Daily   acetaminophen **OR** acetaminophen, albuterol, bisacodyl, diphenhydrAMINE, ipratropium-albuterol, LORazepam, morphine injection, nitroGLYCERIN, ondansetron **OR** ondansetron (ZOFRAN) IV, oxyCODONE  Assessment/ Plan:  56 y.o. male with end stage renal disease on hemodialysis secondary to Alport's syndrome, end stage liver disease with hepatic cirrhosis and ascites, hypertension,  anemia of chronic kidney disease, coronary artery disease, peripheral vascular disease, hyperlipidemia, gastrointestinal AVMs  [facility]  AVF  1. End-stage renal disease. 2. Mild Hyperkalemia, improved K 4.4 3. Hypertension 4. Anemia of chronic kidney disease, anemia secondary to GI bleed as well.  Hgb 9.3. 5. Ascites secondary to end-stage liver disease and cirrhosis  s/p paracentesis 10/29 6. Secondary hyperparathyroidism. Phos 5.1.   Plan: Patient completed hemodialysis today.  He tolerated this well.  Hyperkalemia has improved.  His anemia has also stabilized with a hemoglobin of 9.3.  His bone mineral metabolism parameters are currently acceptable with a phosphorus of 5.1.  We will continue to monitor the patient's progress.  Next planned dialysis will be on Thursday if still here.   LOS: 3 Perlita Forbush, [place] 10/30/20181:59 PM  [place], [place]

## 2017-01-11 ENCOUNTER — Inpatient Hospital Stay: Payer: Medicare Other

## 2017-01-11 DIAGNOSIS — K5900 Constipation, unspecified: Secondary | ICD-10-CM

## 2017-01-11 LAB — HEMOGLOBIN: Hemoglobin: 9.3 g/dL — ABNORMAL LOW (ref 13.0–18.0)

## 2017-01-11 LAB — BASIC METABOLIC PANEL
Anion gap: 6 (ref 5–15)
BUN: 11 mg/dL (ref 6–20)
CHLORIDE: 100 mmol/L — AB (ref 101–111)
CO2: 31 mmol/L (ref 22–32)
Calcium: 8.1 mg/dL — ABNORMAL LOW (ref 8.9–10.3)
Creatinine, Ser: 4.45 mg/dL — ABNORMAL HIGH (ref 0.61–1.24)
GFR, EST AFRICAN AMERICAN: 16 mL/min — AB (ref >60)
GFR, EST NON AFRICAN AMERICAN: 13 mL/min — AB (ref >60)
Glucose, Bld: 103 mg/dL — ABNORMAL HIGH (ref 65–99)
Potassium: 4.2 mmol/L (ref 3.5–5.1)
SODIUM: 137 mmol/L (ref 135–145)

## 2017-01-11 LAB — GLUCOSE, CAPILLARY: Glucose-Capillary: 93 mg/dL (ref 65–99)

## 2017-01-11 LAB — AMMONIA: AMMONIA: 21 umol/L (ref 9–35)

## 2017-01-11 IMAGING — CR DG ABDOMEN 3V
1 series · 4 of 4 positions shown · non-contrast
Comparison: None.

CLINICAL DATA: Abdominal pain.  Distension.

EXAM:
ABDOMEN SERIES

[Series 1: dxr abdomen 3-way (incl pa cxr) · 0.14mm/px · 4 of 4 slices shown]
[im 1/4]
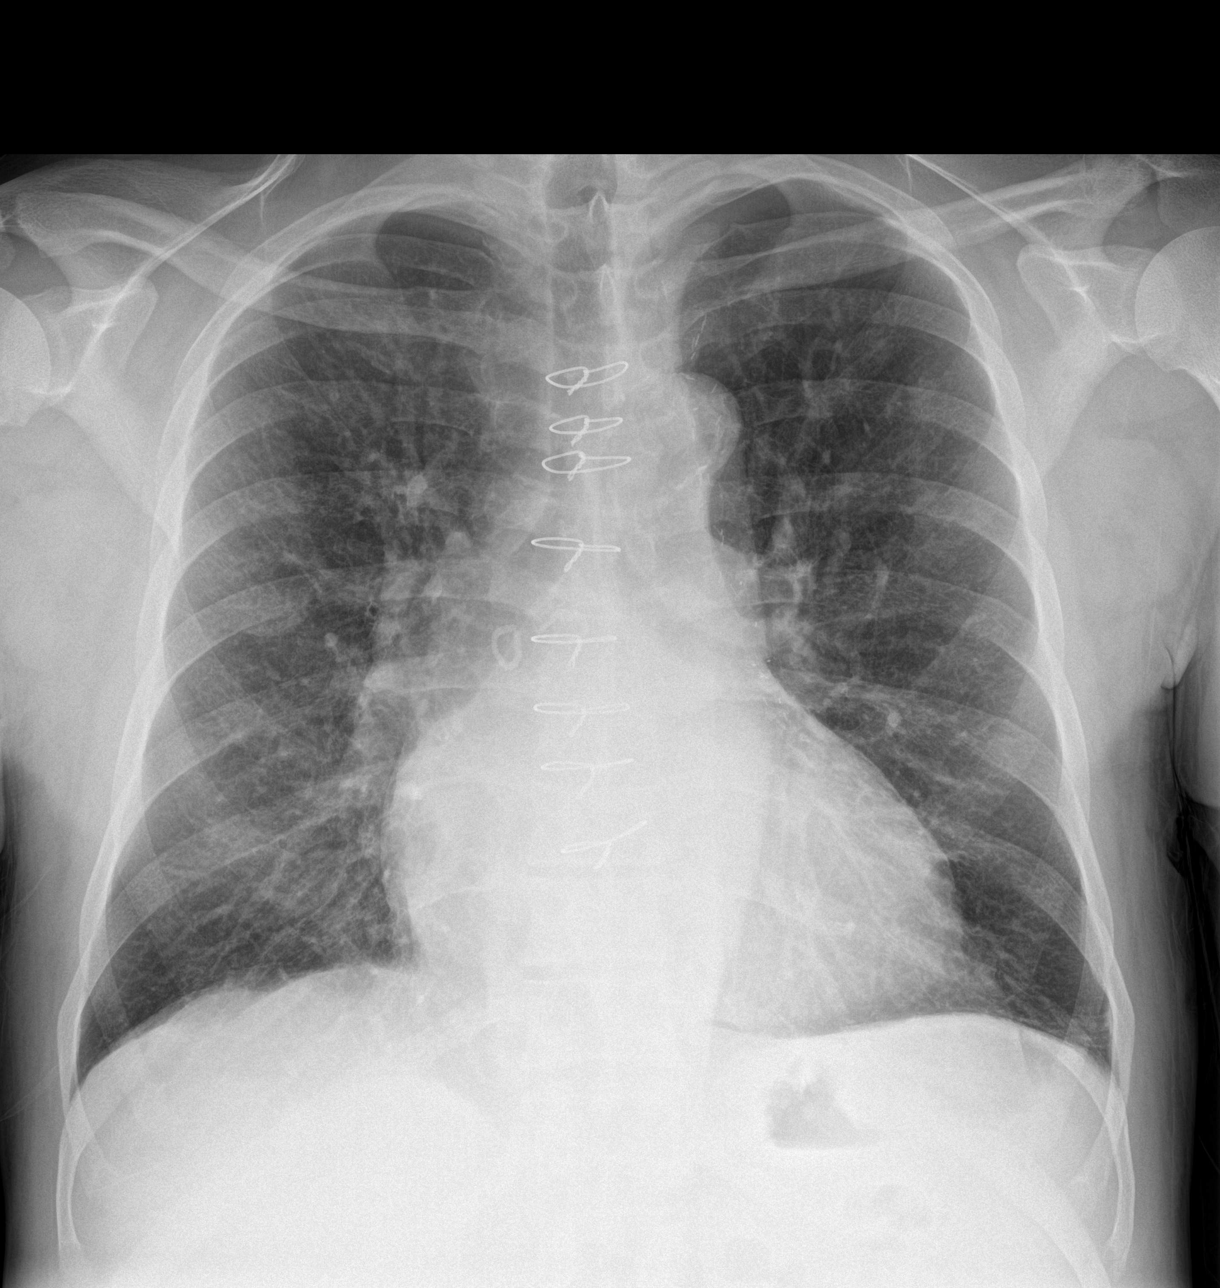
[im 2/4]
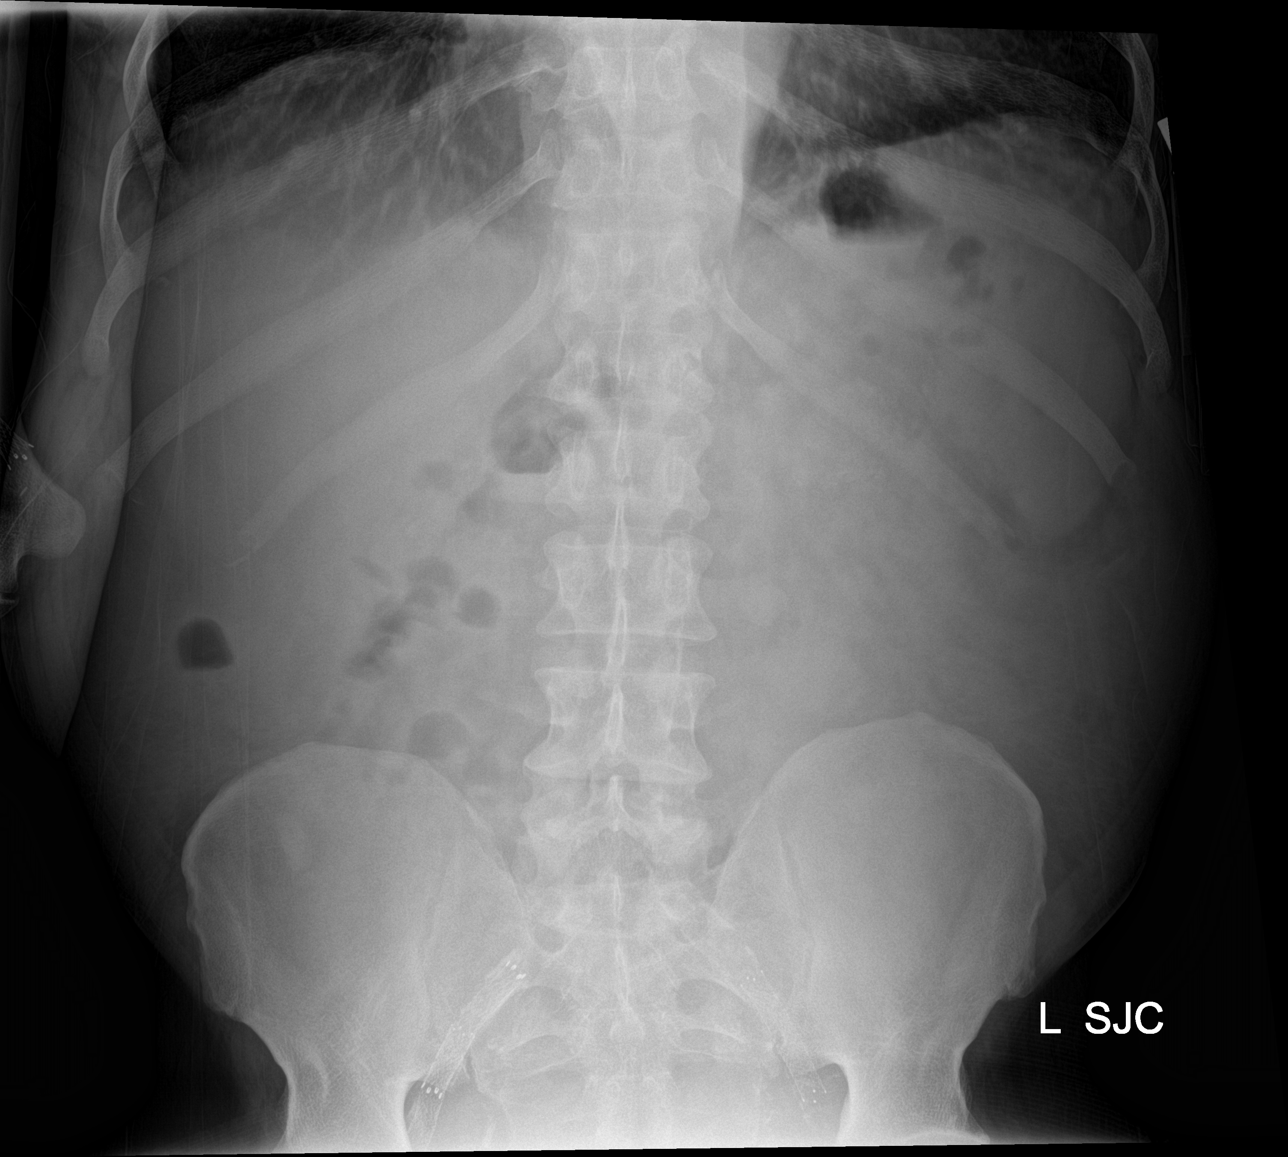
[im 3/4]
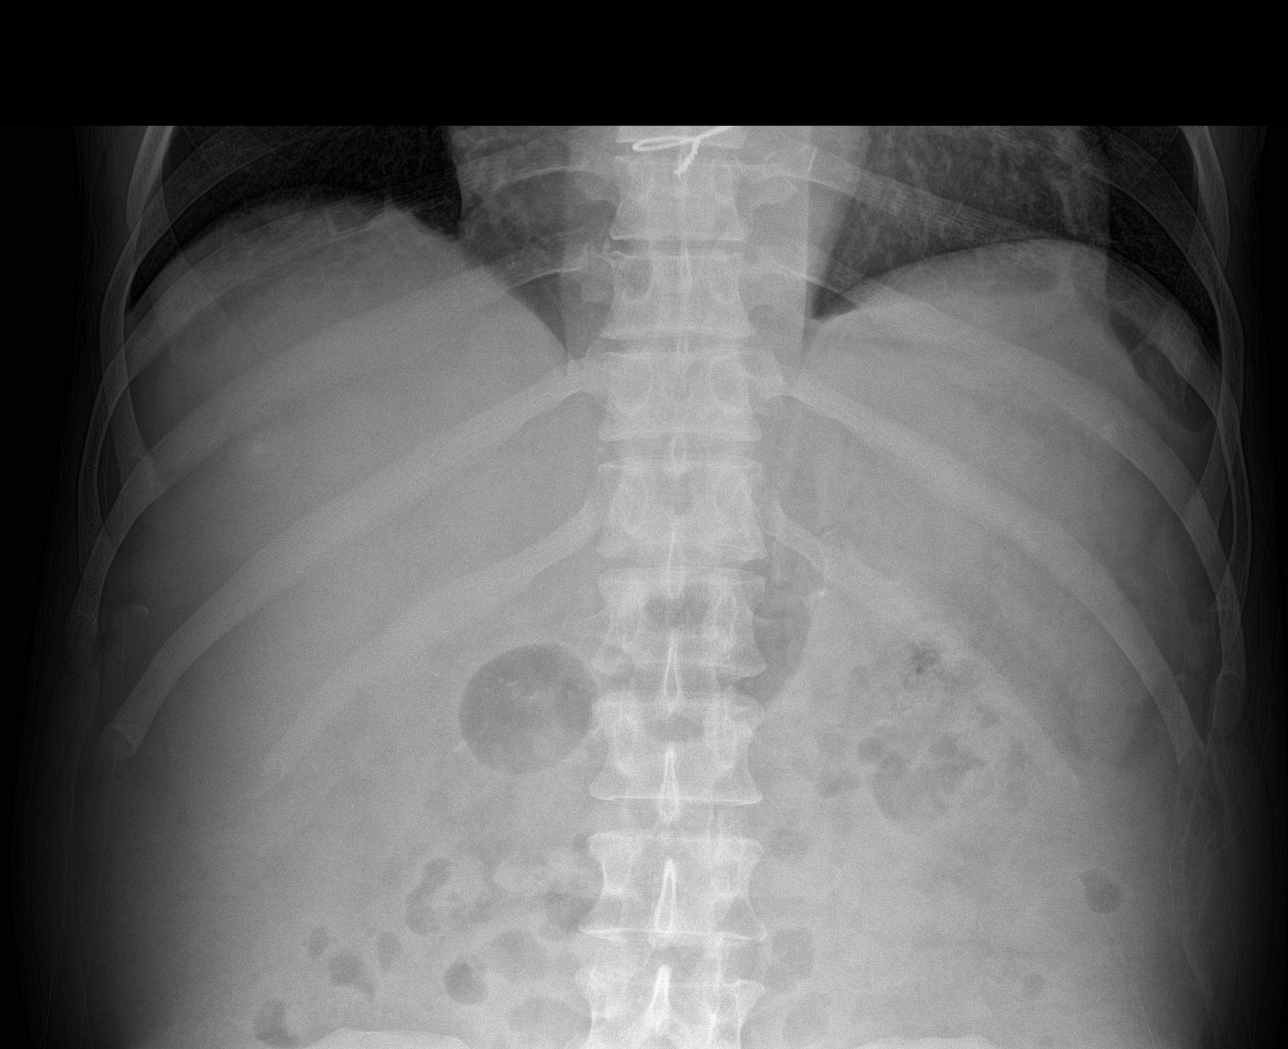
[im 4/4]
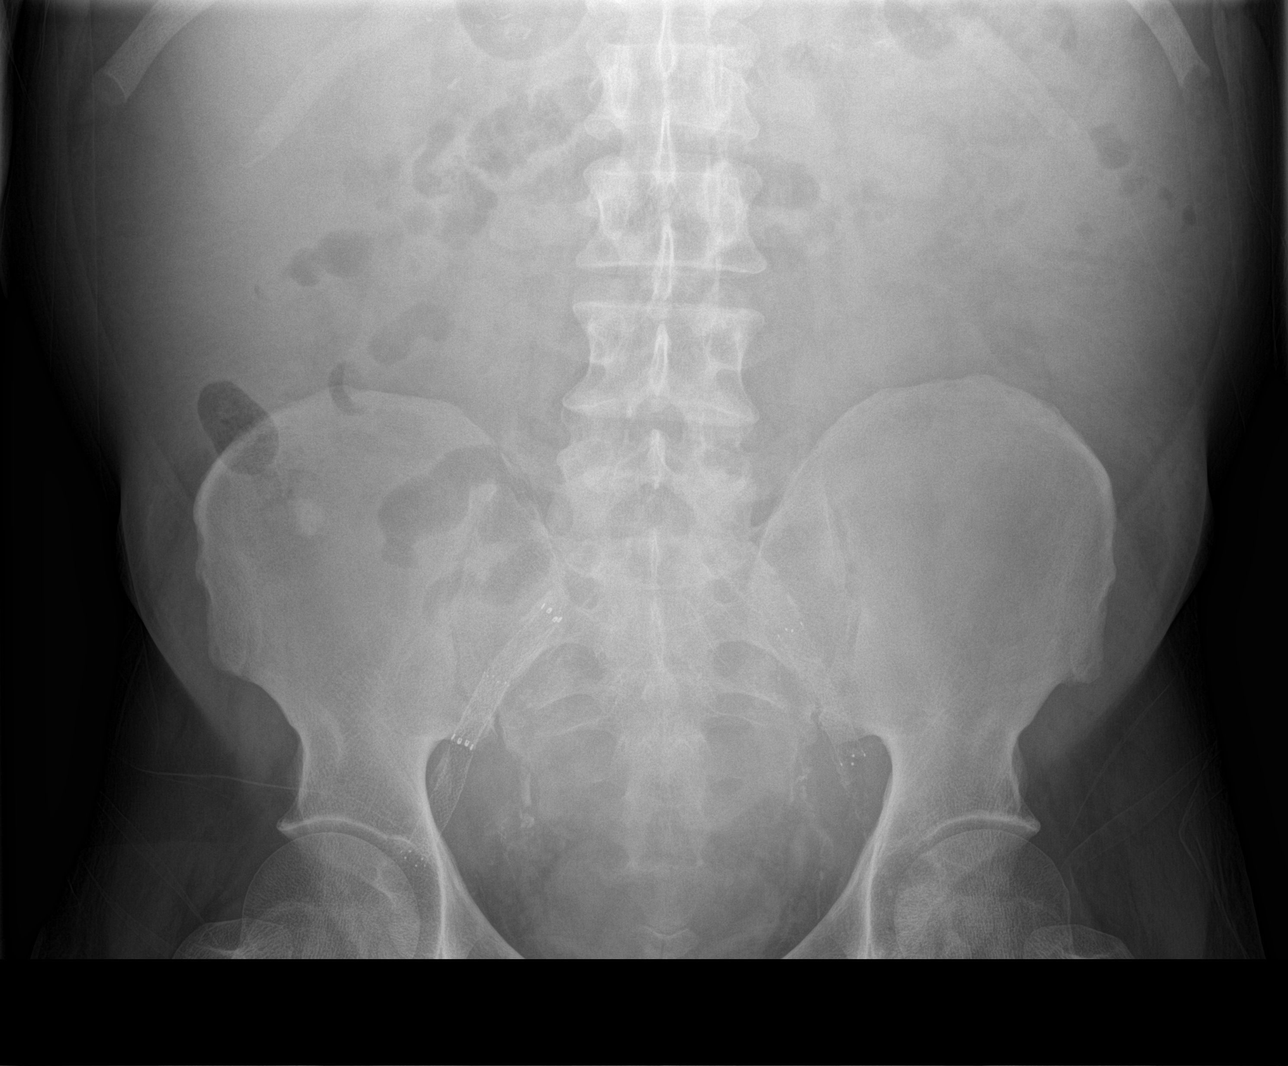

[4 of 4 positions shown; findings below may reference images not displayed]

FINDINGS: Cardiomegaly is present. Postoperative changes CABG/median
sternotomy. Pulmonary vascular congestion is present. Mild LEFT
basilar atelectasis.

No free air is present underneath the hemidiaphragms. The bowel gas
pattern appears within normal limits. Bilateral iliac stents are
noted. No dilated loops of small bowel or pathologic air-fluid
levels.
IMPRESSION: 1. Normal bowel gas pattern.
2. Cardiomegaly and pulmonary vascular congestion suggesting volume
overload/mild CHF.

## 2017-01-11 MED ORDER — LACTULOSE 10 GM/15ML PO SOLN: 30.0000 g | Freq: Three times a day (TID) | ORAL | Status: DC

## 2017-01-11 MED ORDER — NICOTINE 21 MG/24HR TD PT24: 21.0000 mg | MEDICATED_PATCH | Freq: Every day | TRANSDERMAL | 0 refills | Status: DC

## 2017-01-11 MED ORDER — PEG 3350-KCL-NA BICARB-NACL 420 G PO SOLR
2000.0000 mL | Freq: Once | ORAL | Status: AC
  Administered 2017-01-11: 2000 mL via ORAL
  Filled 2017-01-11: qty 4000

## 2017-01-11 MED ORDER — LACTULOSE 10 GM/15ML PO SOLN: 20.0000 g | Freq: Three times a day (TID) | ORAL | 0 refills | Status: DC

## 2017-01-11 MED ORDER — SENNOSIDES-DOCUSATE SODIUM 8.6-50 MG PO TABS
1.0000 | ORAL_TABLET | Freq: Two times a day (BID) | ORAL | Status: DC
  Administered 2017-01-11: 1 via ORAL
  Filled 2017-01-11: qty 1

## 2017-01-11 MED ORDER — CIPROFLOXACIN HCL 500 MG PO TABS: 250.0000 mg | ORAL_TABLET | Freq: Every day | ORAL | 0 refills | Status: DC

## 2017-01-11 NOTE — Progress Notes (Signed)
[place], [place] 01/11/17  Subjective:  Patient had hemodialysis yesterday. Gastroenterology following.  Objective:  Vital signs in last 24 hours:  Temp:  [98 F (36.7 C)-98.4 F (36.9 C)] 98.4 F (36.9 C) (10/31 1336) Pulse Rate:  [81-86] 81 (10/31 1336) Resp:  [18] 18 (10/31 1336) BP: (109-131)/(64-70) 131/64 (10/31 1336) SpO2:  [96 %-98 %] 98 % (10/31 1336) Weight:  [72.2 kg (159 lb 2.8 oz)] 72.2 kg (159 lb 2.8 oz) (10/31 0500)  Weight change: 1.929 kg (4 lb 4 oz) Filed Weights   01/10/17 0920 01/10/17 1254 01/11/17 0500  Weight: 72.1 kg (158 lb 15.2 oz) 71.4 kg (157 lb 6.5 oz) 72.2 kg (159 lb 2.8 oz)    Intake/Output:    Intake/Output Summary (Last 24 hours) at 01/11/17 1359 Last data filed at 01/11/17 1008  Gross per 24 hour  Intake                0 ml  Output                0 ml  Net                0 ml     Physical Exam: General:  Laying in the bed   HEENT  anicteric, moist oral mucous membranes  Neck  distended neck veins  Pulm/lungs  Clear to auscultation bilateral   CVS/Heart  S1S2 no rubs  Abdomen:   distension noted, BS present  Extremities:  No peripheral edema  Neurologic:  Alert, oriented, conversant  Skin:  No acute rashes  Access:  Right upper arm AV fistula       Basic Metabolic Panel:   Recent Labs Lab 01/07/17 1152 01/07/17 1745 01/08/17 0524 01/09/17 0534 01/10/17 0354 01/10/17 0937 01/11/17 0430  NA 140  --  138 136 134*  --  137  K 5.1  --  4.8 5.2* 4.4  --  4.2  CL 98*  --  99* 98* 96*  --  100*  CO2 30  --  26 27 29   --  31  GLUCOSE 130*  --  120* 109* 101*  --  103*  BUN 52*  --  33* 31* 17  --  11  CREATININE 9.08*  --  6.59* 7.18* 5.43*  --  4.45*  CALCIUM 8.8*  --  8.4* 8.6* 7.7*  --  8.1*  PHOS  --  3.2  --   --   --  5.1*  --      CBC:  Recent Labs Lab 01/05/17 1357 01/07/17 1152 01/07/17 2121 01/08/17 0524 01/09/17 0534 01/11/17 0430  WBC 6.9 7.7  --  8.6 8.7  --    NEUTROABS 5.3 5.9  --   --   --   --   HGB 7.9* 6.6* 8.3* 8.5* 9.3* 9.3*  HCT 26.5* 22.1* 25.4* 26.8* 29.7*  --   MCV 80.9 81.0  --  81.0 81.2  --   PLT 461* 416  --  414 417  --       Lab Results  Component Value Date   HEPBSAG Negative 11/26/2016   HEPBSAB Reactive 06/01/2016   HEPBIGM Negative 06/01/2016      Microbiology:  Recent Results (from the past 240 hour(s))  MRSA PCR Screening     Status: None   Collection Time: 01/07/17  3:20 PM  Result Value Ref Range Status   MRSA by PCR NEGATIVE NEGATIVE Final    Comment:  The GeneXpert MRSA Assay (FDA approved for NASAL specimens only), is one component of a comprehensive MRSA colonization surveillance program. It is not intended to diagnose MRSA infection nor to guide or monitor treatment for MRSA infections.   Body fluid culture     Status: None (Preliminary result)   Collection Time: 01/09/17 11:10 AM  Result Value Ref Range Status   Specimen Description PERITONEAL  Final   Special Requests NONE  Final   Gram Stain   Final    FEW WBC PRESENT, PREDOMINANTLY MONONUCLEAR NO ORGANISMS SEEN    Culture   Final    NO GROWTH 2 DAYS Performed at [facility], [address] [place], [place] [address]    Report Status PENDING  Incomplete    Coagulation Studies: No results for input(s): LABPROT, INR in the last 72 hours.  Urinalysis: No results for input(s): COLORURINE, LABSPEC, PHURINE, GLUCOSEU, HGBUR, BILIRUBINUR, KETONESUR, PROTEINUR, UROBILINOGEN, NITRITE, LEUKOCYTESUR in the last 72 hours.  Invalid input(s): APPERANCEUR    Imaging: Dg Abd 1 View  Result Date: 01/11/2017 CLINICAL DATA:  Abdominal pain EXAM: ABDOMEN - 1 VIEW COMPARISON:  05/16/2015 FINDINGS: Diffuse gaseous distention of colon with relative rectosigmoid sparing. The rectosigmoid segments do contain gas, arguing against a low obstruction. Stool is seen multiple segments, no rectal impaction. Accounting for stool there is no  suspected pneumatosis. No concerning mass effect or gas collection. Advanced atherosclerosis. Bi-iliac stenting. IMPRESSION: Diffuse gaseous distension of colon, question colonic ileus or lactulose side effect. Diffuse formed stool without impaction. Electronically Signed   By: Monte Fantasia M.D.   On: 01/11/2017 10:21     Medications:   . sodium chloride    . albumin human    . cefTRIAXone (ROCEPHIN)  IV Stopped (01/11/17 0553)   . aspirin EC  81 mg Oral Daily  . atorvastatin  40 mg Oral Daily  . diphenhydrAMINE  25 mg Intravenous Once  . docusate sodium  100 mg Oral BID  . epoetin (EPOGEN/PROCRIT) injection  4,000 Units Intravenous Q T,Th,Sa-HD  . furosemide  80 mg Oral Daily  . gabapentin  300 mg Oral QHS  . mometasone-formoterol  2 puff Inhalation BID  . nicotine  21 mg Transdermal Daily  . pantoprazole (PROTONIX) IV  40 mg Intravenous Q12H  . polyethylene glycol-electrolytes  2,000 mL Oral Once  . sevelamer carbonate  2,400 mg Oral TID WC  . tiotropium  18 mcg Inhalation Daily   acetaminophen **OR** acetaminophen, albuterol, diphenhydrAMINE, ipratropium-albuterol, LORazepam, morphine injection, nitroGLYCERIN, ondansetron **OR** ondansetron (ZOFRAN) IV, oxyCODONE  Assessment/ Plan:  56 y.o. male with end stage renal disease on hemodialysis secondary to Alport's syndrome, end stage liver disease with hepatic cirrhosis and ascites, hypertension, anemia of chronic kidney disease, coronary artery disease, peripheral vascular disease, hyperlipidemia, gastrointestinal AVMs  [facility] AVF  1. End-stage renal disease. 2. Mild Hyperkalemia, improved K 4.4 3. Hypertension 4. Anemia of chronic kidney disease, anemia secondary to GI bleed as well.  Hgb 9.3. 5. Ascites secondary to end-stage liver disease and cirrhosis s/p paracentesis 10/29 6. Secondary hyperparathyroidism. Phos 5.1.   Plan: Patient completed hemodialysis yesterday.  We will plan for hemodialysis  again tomorrow if the patient is still here. Hyperkalemia has improved with serum potassium of 4.2.  We plan to maintain the patient on Epogen for anemia of chronic kidney disease. He is also being followed by gastroneurology for abdominal distention. He did have paracentesis performed on 01/09/2017.   LOS: 4 Arwa Yero  10/31/20181:59 PM  [place], [place]

## 2017-01-11 NOTE — Progress Notes (Addendum)
Stanley [name] , MD [address] [place], [place], [place], [address], [place], [place], [place], [address] Phone: [phone]  Fax: [phone]   Stanley [name] is being followed for chronic iron deficiency anemia   Subjective: Abdominal distension and pain earlier today . No pain now , was standing up and changing clothes with no issues.    Objective: Vital signs in last 24 hours: Vitals:   01/10/17 1332 01/10/17 2101 01/11/17 0425 01/11/17 0500  BP: 128/65 121/69 109/70   Pulse: 90 86 84   Resp: 20 18 18    Temp: 98 F (36.7 C) 98 F (36.7 C) 98.2 F (36.8 C)   TempSrc: Oral Oral Oral   SpO2: 97% 97% 96%   Weight:    159 lb 2.8 oz (72.2 kg)  Height:       Weight change: 4 lb 4 oz (1.929 kg)  Intake/Output Summary (Last 24 hours) at 01/11/17 1105 Last data filed at 01/11/17 1008  Gross per 24 hour  Intake                0 ml  Output             1500 ml  Net            -1500 ml     Exam: Heart:: Regular rate and rhythm, S1S2 present or without murmur or extra heart sounds Lungs: normal, clear to auscultation and clear to auscultation and percussion Abdomen: disetnded, soft , non tender, few bs+   Lab Results: @LABTEST2 @ Micro Results: Recent Results (from the past 240 hour(s))  MRSA PCR Screening     Status: None   Collection Time: 01/07/17  3:20 PM  Result Value Ref Range Status   MRSA by PCR NEGATIVE NEGATIVE Final    Comment:        The GeneXpert MRSA Assay (FDA approved for NASAL specimens only), is one component of a comprehensive MRSA colonization surveillance program. It is not intended to diagnose MRSA infection nor to guide or monitor treatment for MRSA infections.   Body fluid culture     Status: None (Preliminary result)   Collection Time: 01/09/17 11:10 AM  Result Value Ref Range Status   Specimen Description PERITONEAL  Final   Special Requests NONE  Final   Gram Stain   Final    FEW WBC PRESENT, PREDOMINANTLY  MONONUCLEAR NO ORGANISMS SEEN    Culture   Final    NO GROWTH 2 DAYS Performed at [facility], [address] [place], [place] [address]    Report Status PENDING  Incomplete   Studies/Results: Dg Abd 1 View  Result Date: 01/11/2017 CLINICAL DATA:  Abdominal pain EXAM: ABDOMEN - 1 VIEW COMPARISON:  05/16/2015 FINDINGS: Diffuse gaseous distention of colon with relative rectosigmoid sparing. The rectosigmoid segments do contain gas, arguing against a low obstruction. Stool is seen multiple segments, no rectal impaction. Accounting for stool there is no suspected pneumatosis. No concerning mass effect or gas collection. Advanced atherosclerosis. Bi-iliac stenting. IMPRESSION: Diffuse gaseous distension of colon, question colonic ileus or lactulose side effect. Diffuse formed stool without impaction. Electronically Signed   By: Monte Fantasia M.D.   On: 01/11/2017 10:21   US Paracentesis  Result Date: 01/09/2017 INDICATION: Recurrent ascites EXAM: ULTRASOUND GUIDED THERAPEUTIC PARACENTESIS MEDICATIONS: None. COMPLICATIONS: None immediate. PROCEDURE: Informed written consent was obtained from the patient after a discussion of the risks, benefits and alternatives to treatment. A timeout was  performed prior to the initiation of the procedure. Initial ultrasound scanning demonstrates a large amount of ascites within the right abdomen The right abdomen was prepped and draped in the usual sterile fashion. 1% lidocaine was used for local anesthesia. Following this, a 6 Fr Safe-T-Centesis catheter was introduced. An ultrasound image was saved for documentation purposes. The paracentesis was performed. The catheter was removed and a dressing was applied. The patient tolerated the procedure well without immediate post procedural complication. FINDINGS: A total of approximately 9 L of clear yellow fluid was removed. Samples were sent to the laboratory as requested by the clinical team. IMPRESSION:  Successful ultrasound-guided paracentesis yielding 9 liters of peritoneal fluid. Electronically Signed   By: Inez Catalina M.D.   On: 01/09/2017 11:58   Medications: I have reviewed the patient's current medications. Scheduled Meds: . aspirin EC  81 mg Oral Daily  . atorvastatin  40 mg Oral Daily  . diphenhydrAMINE  25 mg Intravenous Once  . docusate sodium  100 mg Oral BID  . epoetin (EPOGEN/PROCRIT) injection  4,000 Units Intravenous Q T,Th,Sa-HD  . furosemide  80 mg Oral Daily  . gabapentin  300 mg Oral QHS  . lactulose  30 g Oral TID  . mometasone-formoterol  2 puff Inhalation BID  . nicotine  21 mg Transdermal Daily  . pantoprazole (PROTONIX) IV  40 mg Intravenous Q12H  . senna-docusate  1 tablet Oral BID  . sevelamer carbonate  2,400 mg Oral TID WC  . tiotropium  18 mcg Inhalation Daily   Continuous Infusions: . sodium chloride    . albumin human    . cefTRIAXone (ROCEPHIN)  IV Stopped (01/11/17 0553)   PRN Meds:.acetaminophen **OR** acetaminophen, albuterol, bisacodyl, diphenhydrAMINE, ipratropium-albuterol, LORazepam, morphine injection, nitroGLYCERIN, ondansetron **OR** ondansetron (ZOFRAN) IV, oxyCODONE   Assessment: Principal Problem:   Upper GI bleed Active Problems:   ESRD on dialysis ([place])   Ascites   GI bleed   Abdominal pain   Anemia due to chronic blood loss   Stanley [name] is a 56 y.o. 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 Low MCV, ascites, s/p paracentesis(no SBP). Today concerns for abdominal distention, X ray shows no clear obstruction, stool throughout the colon . Last bowel movement was 7 days back -likely distension due to constipation   Plan  1. Stop lactulose 2. Limit/stop narcotics 3. Enemas to help pass stool 4. Can start on some golytely to help with passage of stool. (no small bowel  distension on x ray abdomen ).  5. If no better later today get CT abdomen to r/o obstruction  6. Monitor electrolytes and replace if low 7. Serial abdominal exams and if worsens obtain surgical evaluation.  8. Recent colonoscopy shows no luminal obstruction.  9. If doing well then repeat X ray abdomen tomorrow.     LOS: 4 days   Stanley Bellows, MD 01/11/2017, 11:05 AM

## 2017-01-11 NOTE — Progress Notes (Signed)
[place] [facility] [place] NAME: Stanley [name]    MR#:  [id number]  DATE OF BIRTH:  1961/02/23  SUBJECTIVE:   + abdominal pain and distention. REVIEW OF SYSTEMS:    Review of Systems  Constitutional: Negative for fever, chills weight loss HENT: Negative for ear pain, nosebleeds, congestion, facial swelling, rhinorrhea, neck pain, neck stiffness and ear discharge.   Respiratory: Negative for cough, shortness of breath, wheezing  Cardiovascular: Negative for chest pain, palpitations and leg swelling.  Gastrointestinal:no dark stools and a++bdominal pain and distention  Genitourinary: Negative for dysuria, urgency, frequency, hematuria Musculoskeletal: Negative for back pain or joint pain Neurological: Negative for dizziness, seizures, syncope, focal weakness,  numbness and headaches.  Hematological: Does not bruise/bleed easily.   DRUG ALLERGIES:  No Known Allergies  VITALS:  Blood pressure 109/70, pulse 84, temperature 98.2 F (36.8 C), temperature source Oral, resp. rate 18, height 6\' 3"  (1.905 m), weight 72.2 kg (159 lb 2.8 oz), SpO2 96 %.  PHYSICAL EXAMINATION:  Constitutional: Appears well-developed and well-nourished. No distress. HENT: Normocephalic. Marland Kitchen Oropharynx is clear and moist.  Eyes: Conjunctivae and EOM are normal. PERRLA, no scleral icterus.  Neck: Normal ROM. Neck supple. No JVD. No tracheal deviation. CVS: RRR, S1/S2 +, 3/6 SEM, no gallops, no carotid bruit.  Pulmonary: Effort and breath sounds normal, no stridor, rhonchi, wheezes, but has bibasilar rales.  Abdominal: Soft. BS +, distension NO rebound or guarding.  Musculoskeletal: Normal range of motion. No edema and no tenderness.  Neuro: Alert. CN 2-12 grossly intact. No focal deficits. Skin: Skin is warm and dry. No rash noted. Psychiatric: Normal mood and affect.   LABORATORY PANEL:   CBC  Recent Labs Lab 01/09/17 0534 01/11/17 0430  WBC 8.7  --   HGB 9.3*  9.3*  HCT 29.7*  --   PLT 417  --    ------------------------------------------------------------------------------------------------------------------  Chemistries   Recent Labs Lab 01/08/17 0524  01/11/17 0430  NA 138  < > 137  K 4.8  < > 4.2  CL 99*  < > 100*  CO2 26  < > 31  GLUCOSE 120*  < > 103*  BUN 33*  < > 11  CREATININE 6.59*  < > 4.45*  CALCIUM 8.4*  < > 8.1*  AST 20  --   --   ALT 16*  --   --   ALKPHOS 117  --   --   BILITOT 0.7  --   --   < > = values in this interval not displayed. ------------------------------------------------------------------------------------------------------------------  Cardiac Enzymes  Recent Labs Lab 01/05/17 1357  TROPONINI 0.05*   ------------------------------------------------------------------------------------------------------------------  RADIOLOGY:  Dg Abd 1 View  Result Date: 01/11/2017 CLINICAL DATA:  Abdominal pain EXAM: ABDOMEN - 1 VIEW COMPARISON:  05/16/2015 FINDINGS: Diffuse gaseous distention of colon with relative rectosigmoid sparing. The rectosigmoid segments do contain gas, arguing against a low obstruction. Stool is seen multiple segments, no rectal impaction. Accounting for stool there is no suspected pneumatosis. No concerning mass effect or gas collection. Advanced atherosclerosis. Bi-iliac stenting. IMPRESSION: Diffuse gaseous distension of colon, question colonic ileus or lactulose side effect. Diffuse formed stool without impaction. Electronically Signed   By: Monte Fantasia M.D.   On: 01/11/2017 10:21   US Paracentesis  Result Date: 01/09/2017 INDICATION: Recurrent ascites EXAM: ULTRASOUND GUIDED THERAPEUTIC PARACENTESIS MEDICATIONS: None. COMPLICATIONS: None immediate. PROCEDURE: Informed written consent was obtained from the patient after a discussion of the risks, benefits and alternatives  to treatment. A timeout was performed prior to the initiation of the procedure. Initial ultrasound scanning  demonstrates a large amount of ascites within the right abdomen The right abdomen was prepped and draped in the usual sterile fashion. 1% lidocaine was used for local anesthesia. Following this, a 6 Fr Safe-T-Centesis catheter was introduced. An ultrasound image was saved for documentation purposes. The paracentesis was performed. The catheter was removed and a dressing was applied. The patient tolerated the procedure well without immediate post procedural complication. FINDINGS: A total of approximately 9 L of clear yellow fluid was removed. Samples were sent to the laboratory as requested by the clinical team. IMPRESSION: Successful ultrasound-guided paracentesis yielding 9 liters of peritoneal fluid. Electronically Signed   By: Inez Catalina M.D.   On: 01/09/2017 11:58     ASSESSMENT AND PLAN:   56 year old male with history of end-stage renal disease on hemodialysis, chronic iron deficiency anemia, liver cirrhosis from EtOH chronic GI blood loss due to small bowel angiectasia, polysubstance abuse and noncompliance who presents to the ER with abdominal pain, melena and shortness of breath. Patient did leave AMA on October 13.   1. Abdominal pain from severe abdominal distention from ascites and now possible ileus: Diagnostic and therapeutic paracentesis with 9 L fluid. Continue Rocephin due to ascites and GI bleed GI consult colonic ileus Cont aggressive bowel regimen  2. Upper GI bleed with acute on chronic blood loss anemia: Patient has a history of small bowel AVMs and has been requested to follow-up with [facility] for small bowel enteroscopy however he has not done so. He had colonoscopy which showed nonbleeding AVMs in 2018 which were ablated and enteroscopy at that time which shows nonbleeding duodenal AVMs  He underwent EGD in the past which showed gastritis   He is status post 2 units. Hemoglobin is stable  Continue PPI   Patient will need outpatient follow-up with [facility] for small bowel  enteroscopy  3. [name]: Dialysis planned for Tuesday, Thursday and Saturday HD today due to hyperkalemia per Dr. Holley Raring.  Hyperkalemia. Dialysis today.  4.Tobacco dependence: Patient is encouraged to quit smoking. Counseling was provided for 4 minutes.  5. CAD: Continue ASA, Statin, labetolol 6. Chronic diastolic heart failure with pEF: Continue Lasix  7. Peripheral neuropathy: Continue Neurontin Management plans discussed with the patient and he is in agreement.  CODE STATUS: FULL  TOTAL TIME TAKING CARE OF THIS PATIENT: 23minutes.     POSSIBLE D/C1- 2 days, DEPENDING ON CLINICAL CONDITION.   Kamika Goodloe M.D on 01/11/2017 at 10:30 AM  Between 7am to 6pm - Pager - [phone] After 6pm go to [email or web address] - password [email or web address] [facility]  Office  [phone]  CC: Primary care physician; [name], MD  Note: This dictation was prepared with Dragon dictation along with smaller phrase technology. Any transcriptional errors that result from this process are unintentional.

## 2017-01-11 NOTE — Discharge Summary (Addendum)
[facility] [place] NAME: Stanley [name]    MR#:  [id number]  DATE OF BIRTH:  Mar 28, 1960  DATE OF ADMISSION:  01/07/2017 ADMITTING PHYSICIAN: Idelle Crouch, MD  DATE OF DISCHARGE:01/13/2017  PRIMARY CARE PHYSICIAN: Revelo, Elyse Jarvis, MD    ADMISSION DIAGNOSIS:  Pain [R52] ESRD (end stage renal disease) ([place]) [N18.6] Symptomatic anemia [D64.9] Gastrointestinal hemorrhage, unspecified gastrointestinal hemorrhage type [K92.2]  DISCHARGE DIAGNOSIS:  Principal Problem:   Upper GI bleed Active Problems:   ESRD on dialysis ([place])   Ascites   GI bleed   Abdominal pain   Anemia due to chronic blood loss   SECONDARY DIAGNOSIS:   Past Medical History:  Diagnosis Date  . Alcohol abuse   . CHF (congestive heart failure) ([place])   . Cirrhosis ([place])   . Coronary artery disease 2009  . Diabetic peripheral neuropathy ([place])   . Drug abuse ([place])   . End stage renal disease on dialysis [facility]) NEPHROLOGIST-   DR [facility]  IN [place]   HEMODIALYSIS --   TUES/  THURS/  SAT  . GERD (gastroesophageal reflux disease)   . Hyperlipidemia   . Hypertension   . PAD (peripheral artery disease) ([place])   . Renal insufficiency    Per pt, 32 oz fluid restriction per day  . S/P triple vessel bypass 06/09/2016   2009ish  . Suicidal ideation    & HOMICIDAL IDEATION --  06-16-2013   ADMITTED TO BEHAVIOR HEALTH    HOSPITAL COURSE:  56 year old male with history of end-stage renal disease on hemodialysis, chronic iron deficiency anemia, liver cirrhosis from EtOH chronic GI blood loss due to small bowel angiectasia, polysubstance abuse and noncompliance who presents to the ER with abdominal pain, melena and shortness of breath. Patient did leave AMA on October 13.   1. Abdominal pain from severe abdominal distention from ascites due to liver cirrhosis: Diagnostic and therapeutic paracentesis with 9 L fluid. She will be discharged on oral Cipro given  ascites and GI bleed on admission.  There was no evidence of SBP.   2. Upper GI bleed with acute on chronic blood loss anemia: Patient has a history of small bowel AVMs and has been requested to follow-up with [facility] for small bowel enteroscopy however he has not done so. He had colonoscopy which showed nonbleeding AVMs in 2018 which were ablated and enteroscopy at that time which shows nonbleeding duodenal AVMs  He underwent EGD in the past which showed gastritis   He is status post 2 units. Hemoglobin is stable  Continue PPI Appreciate GI consultation Patient will need outpatient follow-up with [facility] for small bowel enteroscopy  3. [name]: He will continue dialysis on Tuesday, Thursday and Saturday Hyperkalemia   improved with hemodialysis 4.Tobacco dependence: Patient is encouraged to quit smoking. Counseling was provided.  5. CAD: Continue ASA, Statin, labetolol 6. Chronic diastolic heart failure with pEF: Continue Lasix  7. Peripheral neuropathy: Continue Neurontin  8. Severe constipation: He has hx of constipation with recent colonscopy not concerning for tumor. He needs Lasctulose TID DISCHARGE CONDITIONS AND DIET:   Stable for discharge on renal diet  CONSULTS OBTAINED:  Treatment Team:  [name], MD Murlean Iba, MD  DRUG ALLERGIES:  No Known Allergies  DISCHARGE MEDICATIONS:   Current Discharge Medication List    START taking these medications   Details  ciprofloxacin (CIPRO) 500 MG tablet Take 0.5 tablets (250 mg total) by mouth daily. Qty:  30 tablet, Refills: 0    lactulose (CHRONULAC) 10 GM/15ML solution Take 30 mLs (20 g total) by mouth 3 (three) times daily. Qty: 240 mL, Refills: 0    nicotine (NICODERM CQ - DOSED IN MG/24 HOURS) 21 mg/24hr patch Place 1 patch (21 mg total) onto the skin daily. Qty: 28 patch, Refills: 0      CONTINUE these medications which have NOT CHANGED   Details  albuterol (PROVENTIL  HFA;VENTOLIN HFA) 108 (90 Base) MCG/ACT inhaler Inhale 4-6 puffs by mouth every 4 hours as needed for wheezing, cough, and/or shortness of breath Qty: 1 Inhaler, Refills: 1    aspirin EC 81 MG tablet Take 1 tablet (81 mg total) by mouth daily. Qty: 30 tablet, Refills: 0    atorvastatin (LIPITOR) 40 MG tablet Take 1 tablet (40 mg total) by mouth daily. Qty: 30 tablet, Refills: 1    budesonide-formoterol (SYMBICORT) 160-4.5 MCG/ACT inhaler Inhale 2 puffs into the lungs daily. Qty: 1 Inhaler, Refills: 1    furosemide (LASIX) 80 MG tablet Take 1 tablet (80 mg total) by mouth daily. Qty: 30 tablet, Refills: 0    gabapentin (NEURONTIN) 300 MG capsule TAKE 1 CAPSULE BY MOUTH EVERY EVENING AND 1 EXTRA CAP ON DIALYSIS DAYS AS NEEDED FOR NERVE PAIN Refills: 11    ipratropium-albuterol (DUONEB) 0.5-2.5 (3) MG/3ML SOLN Take 3 mLs by nebulization every 6 (six) hours as needed. Qty: 360 mL, Refills: 0    labetalol (NORMODYNE) 100 MG tablet Take 1 tablet (100 mg total) by mouth daily. Qty: 30 tablet, Refills: 0    Multiple Vitamins-Minerals-FA (DIALYVITE SUPREME D) 3 MG TABS Take 1 tablet (3 mg total) by mouth daily. Qty: 30 tablet, Refills: 0    pantoprazole (PROTONIX) 40 MG tablet Take 1 tablet (40 mg total) by mouth daily. Qty: 30 tablet, Refills: 0    tiotropium (SPIRIVA HANDIHALER) 18 MCG inhalation capsule Place 1 capsule (18 mcg total) into inhaler and inhale daily. Qty: 30 capsule, Refills: 2    nitroGLYCERIN (NITROSTAT) 0.4 MG SL tablet Place 1 tablet (0.4 mg total) under the tongue every 5 (five) minutes as needed. Qty: 25 tablet, Refills: 6    sevelamer carbonate (RENVELA) 800 MG tablet Take 3 tablets (2,400 mg total) by mouth 3 (three) times daily with meals. Qty: 270 tablet, Refills: 1          Today   CHIEF COMPLAINT:   Abdominal pain has improved.   VITAL SIGNS:  Blood pressure 109/70, pulse 84, temperature 98.2 F (36.8 C), temperature source Oral, resp. rate 18,  height 6\' 3"  (1.905 m), weight 72.2 kg (159 lb 2.8 oz), SpO2 96 %.   REVIEW OF SYSTEMS:  Review of Systems  Constitutional: Negative.  Negative for chills, fever and malaise/fatigue.  HENT: Negative.  Negative for ear discharge, ear pain, hearing loss, nosebleeds and sore throat.   Eyes: Negative.  Negative for blurred vision and pain.  Respiratory: Negative.  Negative for cough, hemoptysis, shortness of breath and wheezing.   Cardiovascular: Negative.  Negative for chest pain, palpitations and leg swelling.  Gastrointestinal: Positive for abdominal pain (better). Negative for blood in stool, diarrhea, nausea and vomiting.  Genitourinary: Negative.  Negative for dysuria.  Musculoskeletal: Negative.  Negative for back pain.  Skin: Negative.   Neurological: Negative for dizziness, tremors, speech change, focal weakness, seizures and headaches.  Endo/Heme/Allergies: Negative.  Does not bruise/bleed easily.  Psychiatric/Behavioral: Negative.  Negative for depression, hallucinations and suicidal ideas.     PHYSICAL EXAMINATION:  GENERAL:  56 y.o.-year-old patient lying in the bed with no acute distress.  NECK:  Supple, no jugular venous distention. No thyroid enlargement, no tenderness.  LUNGS: Normal breath sounds bilaterally, no wheezing, rales,rhonchi  No use of accessory muscles of respiration.  CARDIOVASCULAR: S1, S2 normal. No murmurs, rubs, or gallops.  ABDOMEN: Soft, non-tender somewhatdistended. Bowel sounds present. No organomegaly or mass.  EXTREMITIES: No pedal edema, cyanosis, or clubbing.  PSYCHIATRIC: The patient is alert and oriented x 3.  SKIN: No obvious rash, lesion, or ulcer.   DATA REVIEW:   CBC  Recent Labs Lab 01/09/17 0534 01/11/17 0430  WBC 8.7  --   HGB 9.3* 9.3*  HCT 29.7*  --   PLT 417  --     Chemistries   Recent Labs Lab 01/08/17 0524  01/11/17 0430  NA 138  < > 137  K 4.8  < > 4.2  CL 99*  < > 100*  CO2 26  < > 31  GLUCOSE 120*  < >  103*  BUN 33*  < > 11  CREATININE 6.59*  < > 4.45*  CALCIUM 8.4*  < > 8.1*  AST 20  --   --   ALT 16*  --   --   ALKPHOS 117  --   --   BILITOT 0.7  --   --   < > = values in this interval not displayed.  Cardiac Enzymes  Recent Labs Lab 01/05/17 1357  TROPONINI 0.05*    Microbiology Results  @MICRORSLT48 @  RADIOLOGY:  US Paracentesis  Result Date: 01/09/2017 INDICATION: Recurrent ascites EXAM: ULTRASOUND GUIDED THERAPEUTIC PARACENTESIS MEDICATIONS: None. COMPLICATIONS: None immediate. PROCEDURE: Informed written consent was obtained from the patient after a discussion of the risks, benefits and alternatives to treatment. A timeout was performed prior to the initiation of the procedure. Initial ultrasound scanning demonstrates a large amount of ascites within the right abdomen The right abdomen was prepped and draped in the usual sterile fashion. 1% lidocaine was used for local anesthesia. Following this, a 6 Fr Safe-T-Centesis catheter was introduced. An ultrasound image was saved for documentation purposes. The paracentesis was performed. The catheter was removed and a dressing was applied. The patient tolerated the procedure well without immediate post procedural complication. FINDINGS: A total of approximately 9 L of clear yellow fluid was removed. Samples were sent to the laboratory as requested by the clinical team. IMPRESSION: Successful ultrasound-guided paracentesis yielding 9 liters of peritoneal fluid. Electronically Signed   By: Inez Catalina M.D.   On: 01/09/2017 11:58      Current Discharge Medication List    START taking these medications   Details  ciprofloxacin (CIPRO) 500 MG tablet Take 0.5 tablets (250 mg total) by mouth daily. Qty: 30 tablet, Refills: 0    lactulose (CHRONULAC) 10 GM/15ML solution Take 30 mLs (20 g total) by mouth 3 (three) times daily. Qty: 240 mL, Refills: 0    nicotine (NICODERM CQ - DOSED IN MG/24 HOURS) 21 mg/24hr patch Place 1 patch  (21 mg total) onto the skin daily. Qty: 28 patch, Refills: 0      CONTINUE these medications which have NOT CHANGED   Details  albuterol (PROVENTIL HFA;VENTOLIN HFA) 108 (90 Base) MCG/ACT inhaler Inhale 4-6 puffs by mouth every 4 hours as needed for wheezing, cough, and/or shortness of breath Qty: 1 Inhaler, Refills: 1    aspirin EC 81 MG tablet Take 1 tablet (81 mg total) by mouth daily. Qty: 30 tablet,  Refills: 0    atorvastatin (LIPITOR) 40 MG tablet Take 1 tablet (40 mg total) by mouth daily. Qty: 30 tablet, Refills: 1    budesonide-formoterol (SYMBICORT) 160-4.5 MCG/ACT inhaler Inhale 2 puffs into the lungs daily. Qty: 1 Inhaler, Refills: 1    furosemide (LASIX) 80 MG tablet Take 1 tablet (80 mg total) by mouth daily. Qty: 30 tablet, Refills: 0    gabapentin (NEURONTIN) 300 MG capsule TAKE 1 CAPSULE BY MOUTH EVERY EVENING AND 1 EXTRA CAP ON DIALYSIS DAYS AS NEEDED FOR NERVE PAIN Refills: 11    ipratropium-albuterol (DUONEB) 0.5-2.5 (3) MG/3ML SOLN Take 3 mLs by nebulization every 6 (six) hours as needed. Qty: 360 mL, Refills: 0    labetalol (NORMODYNE) 100 MG tablet Take 1 tablet (100 mg total) by mouth daily. Qty: 30 tablet, Refills: 0    Multiple Vitamins-Minerals-FA (DIALYVITE SUPREME D) 3 MG TABS Take 1 tablet (3 mg total) by mouth daily. Qty: 30 tablet, Refills: 0    pantoprazole (PROTONIX) 40 MG tablet Take 1 tablet (40 mg total) by mouth daily. Qty: 30 tablet, Refills: 0    tiotropium (SPIRIVA HANDIHALER) 18 MCG inhalation capsule Place 1 capsule (18 mcg total) into inhaler and inhale daily. Qty: 30 capsule, Refills: 2    nitroGLYCERIN (NITROSTAT) 0.4 MG SL tablet Place 1 tablet (0.4 mg total) under the tongue every 5 (five) minutes as needed. Qty: 25 tablet, Refills: 6    sevelamer carbonate (RENVELA) 800 MG tablet Take 3 tablets (2,400 mg total) by mouth 3 (three) times daily with meals. Qty: 270 tablet, Refills: 1          Management plans discussed  with the patient and he is in agreement. Stable for discharge   Patient should follow up with pcp  CODE STATUS:     Code Status Orders        Start     Ordered   01/07/17 1446  Full code  Continuous     01/07/17 1445    Code Status History    Date Active Date Inactive Code Status Order ID Comments User Context   12/22/2016  2:31 PM 12/24/2016  6:04 PM Full Code [id number]  Hillary Bow, MD ED   12/20/2016  8:20 PM 12/21/2016  6:53 PM Full Code [id number]  Demetrios Loll, MD Inpatient   11/26/2016 10:30 AM 11/28/2016  3:22 PM Full Code [id number]  Henreitta Leber, MD Inpatient   11/15/2016  7:30 PM 11/16/2016  6:22 PM Full Code [id number]  Fritzi Mandes, MD Inpatient   10/29/2016  2:53 PM 10/30/2016  3:06 PM Full Code [id number]  Loletha Grayer, MD ED   09/15/2016  5:57 PM 09/16/2016  6:17 PM Full Code [id number]  Dustin Flock, MD ED   08/16/2016  9:45 AM 08/18/2016  6:16 PM Full Code [id number]  Hillary Bow, MD ED   08/09/2016  4:59 PM 08/12/2016  8:34 PM Full Code [id number]  Fritzi Mandes, MD Inpatient   07/03/2016  9:54 PM 07/05/2016  9:22 PM Full Code [id number]  Demetrios Loll, MD Inpatient   06/24/2016  4:23 AM 06/26/2016  3:46 PM Full Code [id number]  Saundra Shelling, MD Inpatient   06/14/2016  2:53 PM 06/19/2016  9:04 PM Full Code [id number]  Hillary Bow, MD ED   05/31/2016  6:42 AM 06/01/2016  9:30 PM Full Code [id number]  Saundra Shelling, MD Inpatient   05/05/2016 11:09 PM 05/10/2016  9:57 PM Full Code [id number]  Vianne Bulls, MD Inpatient   04/16/2016  3:56 AM 04/17/2016  6:27 PM Full Code [id number]  Saundra Shelling, MD Inpatient   01/19/2016  3:13 PM 01/22/2016  2:14 PM Full Code [id number]  Loletha Grayer, MD ED   09/24/2015 10:33 AM 09/25/2015  1:31 PM Full Code [id number]  Loletha Grayer, MD ED   05/16/2015  2:17 PM 05/17/2015  8:57 PM Full Code [id number]  Idelle Crouch, MD Inpatient   04/30/2015  8:08 PM 05/08/2015  7:40 PM Full Code [id number]  Vaughan Basta, MD Inpatient   04/06/2015  5:07 AM 04/07/2015   5:36 PM Full Code [id number]  Lance Coon, MD Inpatient   03/23/2015 11:07 AM 03/27/2015 12:46 PM Full Code [id number]  Bettey Costa, MD Inpatient   06/19/2013 11:32 AM 06/21/2013  5:49 PM Full Code [id number]  Cristal Ford, DO Inpatient   06/16/2013  5:26 PM 06/19/2013 11:32 AM Full Code [id number]  Margarita Mail, PA-C ED   06/05/2013  3:33 PM 06/06/2013  6:56 PM Full Code [id number]  Theodoro Kos, DO Inpatient   06/02/2013  9:01 AM 06/05/2013  3:33 PM Full Code [id number]  Velvet Bathe, MD Inpatient   06/02/2013  8:40 AM 06/02/2013  9:01 AM Full Code [id number]  Velvet Bathe, MD Inpatient      TOTAL TIME TAKING CARE OF THIS PATIENT: 37 minutes.    Note: This dictation was prepared with Dragon dictation along with smaller phrase technology. Any transcriptional errors that result from this process are unintentional.  Tevin Shillingford M.D on 01/11/2017 at 9:44 AM  Between 7am to 6pm - Pager - [phone] After 6pm go to [email or web address] - password [email or web address] [facility]  Office  [phone]  CC: Primary care physician; [name], MD

## 2017-01-12 ENCOUNTER — Inpatient Hospital Stay: Payer: Medicare Other

## 2017-01-12 LAB — BASIC METABOLIC PANEL
Anion gap: 10 (ref 5–15)
BUN: 19 mg/dL (ref 6–20)
CALCIUM: 7.6 mg/dL — AB (ref 8.9–10.3)
CHLORIDE: 94 mmol/L — AB (ref 101–111)
CO2: 32 mmol/L (ref 22–32)
CREATININE: 6.27 mg/dL — AB (ref 0.61–1.24)
GFR calc Af Amer: 10 mL/min — ABNORMAL LOW (ref >60)
GFR calc non Af Amer: 9 mL/min — ABNORMAL LOW (ref >60)
GLUCOSE: 90 mg/dL (ref 65–99)
Potassium: 4.2 mmol/L (ref 3.5–5.1)
Sodium: 136 mmol/L (ref 135–145)

## 2017-01-12 LAB — GLUCOSE, CAPILLARY: Glucose-Capillary: 78 mg/dL (ref 65–99)

## 2017-01-12 LAB — PHOSPHORUS: Phosphorus: 5.9 mg/dL — ABNORMAL HIGH (ref 2.5–4.6)

## 2017-01-12 MED ORDER — CYCLOBENZAPRINE HCL 10 MG PO TABS
5.0000 mg | ORAL_TABLET | Freq: Once | ORAL | Status: AC
  Administered 2017-01-12: 5 mg via ORAL
  Filled 2017-01-12: qty 1

## 2017-01-12 MED ORDER — IOPAMIDOL (ISOVUE-300) INJECTION 61%
100.0000 mL | Freq: Once | INTRAVENOUS | Status: AC | PRN
  Administered 2017-01-12: 100 mL via INTRAVENOUS

## 2017-01-12 MED ORDER — POLYETHYLENE GLYCOL 3350 17 G PO PACK
17.0000 g | PACK | Freq: Every day | ORAL | Status: DC
  Administered 2017-01-12 – 2017-01-13 (×2): 17 g via ORAL
  Filled 2017-01-12 (×3): qty 1

## 2017-01-12 MED ORDER — DOCUSATE SODIUM 100 MG PO CAPS
100.0000 mg | ORAL_CAPSULE | Freq: Two times a day (BID) | ORAL | Status: DC
  Administered 2017-01-12 – 2017-01-13 (×3): 100 mg via ORAL
  Filled 2017-01-12 (×3): qty 1

## 2017-01-12 MED ORDER — SENNA 8.6 MG PO TABS
2.0000 | ORAL_TABLET | Freq: Every day | ORAL | Status: DC
  Administered 2017-01-12 – 2017-01-13 (×2): 17.2 mg via ORAL
  Filled 2017-01-12 (×2): qty 2

## 2017-01-12 MED ORDER — NEPRO/CARBSTEADY PO LIQD
237.0000 mL | Freq: Three times a day (TID) | ORAL | Status: DC
  Administered 2017-01-12 (×2): 237 mL via ORAL

## 2017-01-12 MED ORDER — ADULT MULTIVITAMIN W/MINERALS CH
1.0000 | ORAL_TABLET | ORAL | Status: DC
  Administered 2017-01-12: 1 via ORAL
  Filled 2017-01-12: qty 1

## 2017-01-12 MED ORDER — DIPHENHYDRAMINE HCL 50 MG/ML IJ SOLN
25.0000 mg | Freq: Once | INTRAMUSCULAR | Status: AC
Start: 2017-01-12 — End: 2017-01-12
  Administered 2017-01-12: 50 mg via INTRAVENOUS

## 2017-01-12 MED ORDER — B COMPLEX-C PO TABS
1.0000 | ORAL_TABLET | Freq: Every day | ORAL | Status: DC
  Administered 2017-01-12 – 2017-01-13 (×2): 1 via ORAL
  Filled 2017-01-12 (×2): qty 1

## 2017-01-12 MED ORDER — METHOCARBAMOL 1000 MG/10ML IJ SOLN
500.0000 mg | Freq: Once | INTRAMUSCULAR | Status: AC
  Administered 2017-01-13: 500 mg via INTRAVENOUS
  Filled 2017-01-12: qty 5

## 2017-01-12 NOTE — Progress Notes (Signed)
CT scan reviewed no obsturction/ileus

## 2017-01-12 NOTE — Progress Notes (Signed)
Pt complaining of pain and soreness in right side of neck. Pt requesting muscle relaxer. Primary nurse paged and spoke to Dr. Jannifer Franklin. Orders received for flexeril . Primary nurse to continue to monitor.

## 2017-01-12 NOTE — Progress Notes (Signed)
HD tx start 

## 2017-01-12 NOTE — Progress Notes (Signed)
Initial Nutrition Assessment  DOCUMENTATION CODES:   Non-severe (moderate) malnutrition in context of chronic illness  INTERVENTION:   Recommend check iron/anemia profile   Recommend check vitamin D levels  B complex with vitamin C tablet daily- pt at high risk for niacin, riboflavin, thiamine, B6, B12, and ascorbic acid deficiency r/t etoh abuse, cirrhosis, and hemodialysis.   Bowel regimen as needed  MVI twice weekly  Nepro Shake po TID, each supplement provides 425 kcal and 19 grams protein  NUTRITION DIAGNOSIS:   Moderate Malnutrition related to chronic illness (etoh abuse, ESRD on HD, cirrhosis ) as evidenced by mild fat depletions in orbital regions, chest, and buccal regions, moderate muscle depletions in arms, severe muscle depletions in BLE.  GOAL:   Patient will meet greater than or equal to 90% of their needs  MONITOR:   PO intake, Supplement acceptance, Weight trends, Labs, I & O's  REASON FOR ASSESSMENT:   LOS    ASSESSMENT:   56 y.o. male with end stage renal disease on hemodialysis secondary to Alport's syndrome, end stage liver disease with hepatic cirrhosis and ascites, hypertension, anemia of chronic kidney disease, coronary artery disease, peripheral vascular disease, hyperlipidemia, gastrointestinal AVMs   Met with pt in room today. Pt reports poor appetite and oral intake for the past month. Pt reports that food just does not taste good. Pt has not been drinking any supplements or taking any vitamins. Pt reports that he has lost weight but is unsure as to how much. It is difficult to determine if any true weight loss as pt has many weight fluctuations r/t fluid changes. Pt does appear to have lost some weight over the past 6 months. Pt has lost 28lbs since admit; pt s/p HD 10/27- 2532m removed, 10/30 15037mremoved, and paracentesis 10/29 with 9L removed. No BM since 10/24; bowel regimen as needed. Pt s/p 2 units prbcs 10/27. Pt with h/o iron deficiency  anemia; recommend check iron/anemia profile. Pt reports development of blisters in and around the outside of his mouth; pt at high risk for vitamin deficiencies r/t hemodialysis, etoh use, and cirrhosis. Pt eating 25-90% of meals. Pt is willing to try Nepro. RD will order MVI and Nepro. Pt with secondary hyperthyroidism; recommend check vitamin D levels.        Pt with history of GI bleeds  01/20/16-normal EGD except for gastritis 04/2015- colonoscopy showed no source of bleeding - very rapid witdrawal time of 3 mins 04/17/16 -push enteroscopy was normal  2/18- Push enteroscopy-AVM in jejunum ablated. This was revealed after a capsule study on 05/10/16 .  06/16/16- EGD normal 06/18/16- colonoscopy- poor prep but no source of bleeding seen .  06/21/16- capsule study normal  08/12/16 -colonoscopy few non bleeding AVM;s ablated  08/12/16- Enteroscopy - Few non bleeding duodenal AVMs seen and ablated.   Medications reviewed and include: aspirin, colace, epoeitin, lasix, nicotine, protonix, miralax, senokot, renvela, ceftriaxone, morphine     Labs reviewed: Cl 94(L), creat 6.27(L), Ca 7.6(L), P 5.9(H) Ammonia- 21 wnl Hgb 9.3(L), Hct 29.7(L) PTH- 443- 10/12 BNP- 3470(H)- 9/15 Iron 30(L)- 4/5 B-12- 568- 4/5  Nutrition-Focused physical exam completed.  Pt noted to have mild fat depletions in orbital regions, chest, and buccal regions, moderate muscle depletions in arms, severe muscle depletions in BLE, and no edema.     Diet Order:  Diet renal with fluid restriction Fluid restriction: 1200 mL Fluid; Room service appropriate? Yes; Fluid consistency: Thin Diet - low sodium heart healthy  EDUCATION NEEDS:  Education needs have been addressed  Skin: Reviewed RN Assessment  Last BM:  10/24- constipation   Height:   Ht Readings from Last 1 Encounters:  01/07/17 '6\' 3"'$  (1.905 m)    Weight:   Wt Readings from Last 1 Encounters:  01/12/17 166 lb 3.6 oz (75.4 kg)    Ideal Body Weight:  89.1  kg  BMI:  Body mass index is 20.78 kg/m.  Estimated Nutritional Needs:   Kcal:  2100-2400kcal/day   Protein:  115-131g/day   Fluid:  per MD  Koleen Distance MS, RD, LDN Pager #[phone] After Hours Pager: [phone]

## 2017-01-12 NOTE — Progress Notes (Signed)
[facility], [place] 01/12/17  Subjective:  Patient still having some abdominal distention.   CT scan was negative for obstruction or ileus.  Objective:  Vital signs in last 24 hours:  Temp:  [98 F (36.7 C)-99.1 F (37.3 C)] 99.1 F (37.3 C) (11/01 1135) Pulse Rate:  [82-89] 85 (11/01 1503) Resp:  [10-20] 16 (11/01 1503) BP: (118-135)/(62-90) 123/71 (11/01 1500) SpO2:  [93 %-100 %] 99 % (11/01 1503) Weight:  [72.2 kg (159 lb 2.8 oz)-75.4 kg (166 lb 3.6 oz)] 75.4 kg (166 lb 3.6 oz) (11/01 1135)  Weight change: 0.1 kg (3.5 oz) Filed Weights   01/11/17 0500 01/12/17 0500 01/12/17 1135  Weight: 72.2 kg (159 lb 2.8 oz) 72.2 kg (159 lb 2.8 oz) 75.4 kg (166 lb 3.6 oz)    Intake/Output:    Intake/Output Summary (Last 24 hours) at 01/12/17 1526 Last data filed at 01/12/17 0843  Gross per 24 hour  Intake              980 ml  Output                0 ml  Net              980 ml     Physical Exam: General:  Laying in the bed   HEENT  anicteric, moist oral mucous membranes  Neck  distended neck veins  Pulm/lungs  Clear to auscultation bilateral   CVS/Heart  S1S2 no rubs  Abdomen:   distension noted, BS present  Extremities:  No peripheral edema  Neurologic:  Alert, oriented, conversant  Skin:  No acute rashes  Access:  Right upper arm AV fistula       Basic Metabolic Panel:   Recent Labs Lab 01/07/17 1745 01/08/17 0524 01/09/17 0534 01/10/17 0354 01/10/17 0937 01/11/17 0430 01/12/17 0422 01/12/17 1216  NA  --  138 136 134*  --  137 136  --   K  --  4.8 5.2* 4.4  --  4.2 4.2  --   CL  --  99* 98* 96*  --  100* 94*  --   CO2  --  26 27 29   --  31 32  --   GLUCOSE  --  120* 109* 101*  --  103* 90  --   BUN  --  33* 31* 17  --  11 19  --   CREATININE  --  6.59* 7.18* 5.43*  --  4.45* 6.27*  --   CALCIUM  --  8.4* 8.6* 7.7*  --  8.1* 7.6*  --   PHOS 3.2  --   --   --  5.1*  --   --  5.9*     CBC:  Recent Labs Lab 01/07/17 1152  01/07/17 2121 01/08/17 0524 01/09/17 0534 01/11/17 0430  WBC 7.7  --  8.6 8.7  --   NEUTROABS 5.9  --   --   --   --   HGB 6.6* 8.3* 8.5* 9.3* 9.3*  HCT 22.1* 25.4* 26.8* 29.7*  --   MCV 81.0  --  81.0 81.2  --   PLT 416  --  414 417  --       Lab Results  Component Value Date   HEPBSAG Negative 11/26/2016   HEPBSAB Reactive 06/01/2016   HEPBIGM Negative 06/01/2016      Microbiology:  Recent Results (from the past 240 hour(s))  MRSA PCR Screening  Status: None   Collection Time: 01/07/17  3:20 PM  Result Value Ref Range Status   MRSA by PCR NEGATIVE NEGATIVE Final    Comment:        The GeneXpert MRSA Assay (FDA approved for NASAL specimens only), is one component of a comprehensive MRSA colonization surveillance program. It is not intended to diagnose MRSA infection nor to guide or monitor treatment for MRSA infections.   Body fluid culture     Status: None (Preliminary result)   Collection Time: 01/09/17 11:10 AM  Result Value Ref Range Status   Specimen Description PERITONEAL  Final   Special Requests NONE  Final   Gram Stain   Final    FEW WBC PRESENT, PREDOMINANTLY MONONUCLEAR NO ORGANISMS SEEN    Culture   Final    NO GROWTH 3 DAYS Performed at [facility], [address], [place], [place] [address]    Report Status PENDING  Incomplete    Coagulation Studies: No results for input(s): LABPROT, INR in the last 72 hours.  Urinalysis: No results for input(s): COLORURINE, LABSPEC, PHURINE, GLUCOSEU, HGBUR, BILIRUBINUR, KETONESUR, PROTEINUR, UROBILINOGEN, NITRITE, LEUKOCYTESUR in the last 72 hours.  Invalid input(s): APPERANCEUR    Imaging: Dg Abd 1 View  Result Date: 01/12/2017 CLINICAL DATA:  Followup ileus. EXAM: ABDOMEN - 1 VIEW COMPARISON:  01/11/2017 FINDINGS: Improved bowel gas pattern since prior study. There is less distention of the colon. Scattered loops of small bowel of air but no distention. The soft tissue shadows of the  abdomen are grossly maintained. Stable advanced vascular calcifications with iliac artery stents noted. The bony structures are grossly normal. IMPRESSION: Improving bowel gas pattern with less distention of the colon. Electronically Signed   By: Marijo Sanes M.D.   On: 01/12/2017 07:49   Dg Abd 1 View  Result Date: 01/11/2017 CLINICAL DATA:  Abdominal pain EXAM: ABDOMEN - 1 VIEW COMPARISON:  05/16/2015 FINDINGS: Diffuse gaseous distention of colon with relative rectosigmoid sparing. The rectosigmoid segments do contain gas, arguing against a low obstruction. Stool is seen multiple segments, no rectal impaction. Accounting for stool there is no suspected pneumatosis. No concerning mass effect or gas collection. Advanced atherosclerosis. Bi-iliac stenting. IMPRESSION: Diffuse gaseous distension of colon, question colonic ileus or lactulose side effect. Diffuse formed stool without impaction. Electronically Signed   By: Monte Fantasia M.D.   On: 01/11/2017 10:21   Ct Abdomen Pelvis W Contrast  Result Date: 01/12/2017 CLINICAL DATA:  56 year old male with history of end-stage renal disease on hemodialysis, chronic iron deficiency anemia, liver cirrhosis from EtOH chronic GI blood loss due to small bowel angiectasia, polysubstance abuse and noncompliance who presents to the ER with abdominal pain, melena and shortness of breath.Pt c/o abdominal pain from severe abdominal distention from ascites and now with severe constipation. States last BM was 8 days ago. EXAM: CT ABDOMEN AND PELVIS WITH CONTRAST TECHNIQUE: Multidetector CT imaging of the abdomen and pelvis was performed using the standard protocol following bolus administration of intravenous contrast. CONTRAST:  123mL ISOVUE-300 IOPAMIDOL (ISOVUE-300) INJECTION 61% COMPARISON:  None. FINDINGS: Lower chest: Heart moderately enlarged. Mild dependent subsegmental atelectasis mostly on the left. No acute findings. Hepatobiliary: Liver normal in size. Liver  normal in size and attenuation. No mass or focal lesion. Gallbladder remarkable. No bile duct dilation. Pancreas: Unremarkable. No pancreatic ductal dilatation or surrounding inflammatory changes. Spleen: Borderline enlarged, measuring 13.5 x 11.2 x 4.7 cm. No mass or focal lesion. Adrenals/Urinary Tract: No adrenal masses. Bilateral  marked renal atrophy with multiple low-density renal masses consistent with cysts. No hydronephrosis. Ureters normal in course and in caliber. Bladder is unremarkable. Stomach/Bowel: Moderate distention of the stomach. No wall thickening or adjacent inflammation. Normal small bowel. Mild colonic distention with mild to moderate increase colonic stool. No colonic wall thickening or adjacent inflammation. Normal appendix visualized. Vascular/Lymphatic: Diffuse aortic atherosclerotic calcifications and ectasia. Greatest diameter is infrarenal, 2.5 cm. Significant aortic branch vessel atherosclerotic calcifications. No pathologically enlarged lymph nodes. Reproductive: Mild enlargement of prostate gland. Other: Moderate ascites distends the abdomen. No abdominal wall hernia. Musculoskeletal: No acute fracture or acute finding. No osteoblastic or osteolytic lesions. Bold right lower lateral rib fractures. IMPRESSION: 1. No acute abnormalities within the abdomen or pelvis. 2. Moderate ascites. 3. Marked renal atrophy and acquired renal cystic disease consistent with the history of end-stage renal disease. 4. Although moderate increased stool throughout the colon with mild colonic distention. No bowel obstruction or inflammation. 5. Aortic atherosclerosis. 6. Aortic ectasia. Ectatic abdominal aorta at risk for aneurysm development. Recommend followup by ultrasound in 5 years. This recommendation follows ACR consensus guidelines: White Paper of the ACR Incidental Findings Committee II on Vascular Findings. J Am Coll Radiol 2013; 10:789-794. Electronically Signed   By: Lajean Manes M.D.   On:  01/12/2017 11:31     Medications:   . sodium chloride    . albumin human    . cefTRIAXone (ROCEPHIN)  IV Stopped (01/12/17 0618)   . aspirin EC  81 mg Oral Daily  . atorvastatin  40 mg Oral Daily  . B-complex with vitamin C  1 tablet Oral Daily  . diphenhydrAMINE  25 mg Intravenous Once  . docusate sodium  100 mg Oral BID  . epoetin (EPOGEN/PROCRIT) injection  4,000 Units Intravenous Q T,Th,Sa-HD  . feeding supplement (NEPRO CARB STEADY)  237 mL Oral TID BM  . furosemide  80 mg Oral Daily  . gabapentin  300 mg Oral QHS  . mometasone-formoterol  2 puff Inhalation BID  . multivitamin with minerals  1 tablet Oral Once per day on Mon Thu  . nicotine  21 mg Transdermal Daily  . pantoprazole (PROTONIX) IV  40 mg Intravenous Q12H  . polyethylene glycol  17 g Oral Daily  . senna  2 tablet Oral Daily  . sevelamer carbonate  2,400 mg Oral TID WC  . tiotropium  18 mcg Inhalation Daily   acetaminophen **OR** acetaminophen, albuterol, diphenhydrAMINE, ipratropium-albuterol, LORazepam, nitroGLYCERIN, ondansetron **OR** ondansetron (ZOFRAN) IV  Assessment/ Plan:  56 y.o. male with end stage renal disease on hemodialysis secondary to Alport's syndrome, end stage liver disease with hepatic cirrhosis and ascites, hypertension, anemia of chronic kidney disease, coronary artery disease, peripheral vascular disease, hyperlipidemia, gastrointestinal AVMs  [facility] AVF  1. End-stage renal disease. 2. Mild Hyperkalemia, improved K 4.2 3. Hypertension 4. Anemia of chronic kidney disease, anemia secondary to GI bleed as well.  Hgb 9.3. 5. Ascites secondary to end-stage liver disease and cirrhosis s/p paracentesis 10/29 6. Secondary hyperparathyroidism. Phos 5.9  Plan: Patient completed hemodialysis today.  He tolerated this quite well.  Serum potassium today at target of 4.2.  In regards to anemia chronic kidney disease most recent hemoglobin was 9.3 which is close to target.  He  still is having some abdominal distention and CT scan of the abdomen and pelvis was negative for ileus or obstruction but a moderate amount of stool was noted in the colon.  Management of constipation as per hospitalist.  LOS: 5 Payton Moder 11/1/20183:26 PM  [facility] [place], [facility]

## 2017-01-12 NOTE — Progress Notes (Signed)
[place] [facility] [place] NAME: Stanley [name]    MR#:  [id number]  DATE OF BIRTH:  1960/08/05  SUBJECTIVE:   Still with distended abdomen. Tolerating diet. No bowel movement for 8 days.  REVIEW OF SYSTEMS:    Review of Systems  Constitutional: Negative for fever, chills weight loss HENT: Negative for ear pain, nosebleeds, congestion, facial swelling, rhinorrhea, neck pain, neck stiffness and ear discharge.   Respiratory: Negative for cough, shortness of breath, wheezing  Cardiovascular: Negative for chest pain, palpitations and leg swelling.  Gastrointestinal:no dark stools and continues to endorse abdominal distention and constipation Genitourinary: Negative for dysuria, urgency, frequency, hematuria Musculoskeletal: Negative for back pain or joint pain Neurological: Negative for dizziness, seizures, syncope, focal weakness,  numbness and headaches.  Hematological: Does not bruise/bleed easily.   DRUG ALLERGIES:  No Known Allergies  VITALS:  Blood pressure 118/73, pulse 88, temperature 98.2 F (36.8 C), resp. rate 19, height 6\' 3"  (1.905 m), weight 72.2 kg (159 lb 2.8 oz), SpO2 93 %.  PHYSICAL EXAMINATION:  Constitutional: Appears well-developed and well-nourished. No distress. HENT: Normocephalic. Marland Kitchen Oropharynx is clear and moist.  Eyes: Conjunctivae and EOM are normal. PERRLA, no scleral icterus.  Neck: Normal ROM. Neck supple. No JVD. No tracheal deviation. CVS: RRR, S1/S2 +, 3/6 SEM, no gallops, no carotid bruit.  Pulmonary: Effort and breath sounds normal, no stridor, rhonchi, wheezes, but has bibasilar rales.  Abdominal: Soft. BS +, distension NO rebound or guarding.  Musculoskeletal: Normal range of motion. No edema and no tenderness.  Neuro: Alert. CN 2-12 grossly intact. No focal deficits. Skin: Skin is warm and dry. No rash noted. Psychiatric: Normal mood and affect.   LABORATORY PANEL:   CBC  Recent Labs Lab  01/09/17 0534 01/11/17 0430  WBC 8.7  --   HGB 9.3* 9.3*  HCT 29.7*  --   PLT 417  --    ------------------------------------------------------------------------------------------------------------------  Chemistries   Recent Labs Lab 01/08/17 0524  01/12/17 0422  NA 138  < > 136  K 4.8  < > 4.2  CL 99*  < > 94*  CO2 26  < > 32  GLUCOSE 120*  < > 90  BUN 33*  < > 19  CREATININE 6.59*  < > 6.27*  CALCIUM 8.4*  < > 7.6*  AST 20  --   --   ALT 16*  --   --   ALKPHOS 117  --   --   BILITOT 0.7  --   --   < > = values in this interval not displayed. ------------------------------------------------------------------------------------------------------------------  Cardiac Enzymes  Recent Labs Lab 01/05/17 1357  TROPONINI 0.05*   ------------------------------------------------------------------------------------------------------------------  RADIOLOGY:  Dg Abd 1 View  Result Date: 01/12/2017 CLINICAL DATA:  Followup ileus. EXAM: ABDOMEN - 1 VIEW COMPARISON:  01/11/2017 FINDINGS: Improved bowel gas pattern since prior study. There is less distention of the colon. Scattered loops of small bowel of air but no distention. The soft tissue shadows of the abdomen are grossly maintained. Stable advanced vascular calcifications with iliac artery stents noted. The bony structures are grossly normal. IMPRESSION: Improving bowel gas pattern with less distention of the colon. Electronically Signed   By: Marijo Sanes M.D.   On: 01/12/2017 07:49   Dg Abd 1 View  Result Date: 01/11/2017 CLINICAL DATA:  Abdominal pain EXAM: ABDOMEN - 1 VIEW COMPARISON:  05/16/2015 FINDINGS: Diffuse gaseous distention of colon with relative rectosigmoid sparing. The rectosigmoid segments do  contain gas, arguing against a low obstruction. Stool is seen multiple segments, no rectal impaction. Accounting for stool there is no suspected pneumatosis. No concerning mass effect or gas collection. Advanced  atherosclerosis. Bi-iliac stenting. IMPRESSION: Diffuse gaseous distension of colon, question colonic ileus or lactulose side effect. Diffuse formed stool without impaction. Electronically Signed   By: Monte Fantasia M.D.   On: 01/11/2017 10:21     ASSESSMENT AND PLAN:   56 year old male with history of end-stage renal disease on hemodialysis, chronic iron deficiency anemia, liver cirrhosis from EtOH chronic GI blood loss due to small bowel angiectasia, polysubstance abuse and noncompliance who presents to the ER with abdominal pain, melena and shortness of breath. Patient did leave AMA on October 13.   1. Abdominal pain from severe abdominal distention from ascites and now with severe constipation Diagnostic and therapeutic paracentesis with 9 L fluid. Continue Rocephin due to ascites and GI bleed Due to ongoing abdominal pain will order CT of abdomen Continue aggressive bowel regimen Appreciate GI consultation  2. Upper GI bleed with acute on chronic blood loss anemia: Patient has a history of small bowel AVMs and has been requested to follow-up with [facility] for small bowel enteroscopy however he has not done so. He had colonoscopy which showed nonbleeding AVMs in 2018 which were ablated and enteroscopy at that time which shows nonbleeding duodenal AVMs  He underwent EGD in the past which showed gastritis   He is status post 2 units. Hemoglobin is stable  Continue PPI   Patient will need outpatient follow-up with [facility] for small bowel enteroscopy  3. [name]: Dialysis planned for Tuesday, Thursday and Saturday   4.Tobacco dependence: Patient is encouraged to quit smoking.    5. CAD: Continue ASA, Statin, labetolol 6. Chronic diastolic heart failure with pEF: Continue Lasix  7. Peripheral neuropathy: Continue Neurontin   Management plans discussed with the patient and he is in agreement.  CODE STATUS: FULL  TOTAL TIME TAKING CARE OF THIS PATIENT:  27 minutes.     POSSIBLE D/C 1- 2 days, DEPENDING ON CLINICAL CONDITION.   Shatoya Roets M.D on 01/12/2017 at 9:53 AM  Between 7am to 6pm - Pager - [phone] After 6pm go to [email or web address] - password [email or web address] [facility]  Office  [phone]  CC: Primary care physician; [name], MD  Note: This dictation was prepared with Dragon dictation along with smaller phrase technology. Any transcriptional errors that result from this process are unintentional.

## 2017-01-12 NOTE — Progress Notes (Signed)
Pre HD assessment  

## 2017-01-12 NOTE — Progress Notes (Signed)
Post HD assessment  

## 2017-01-12 NOTE — Progress Notes (Signed)
HD tx end  

## 2017-01-12 NOTE — Progress Notes (Signed)
Post hd assessment 

## 2017-01-12 NOTE — Progress Notes (Signed)
Discussed with Dr Benjie Karvonen, he is feeling better, CT scan shows no acute findings. If he feels more constipated or has not had an adequate bowel movement can give another 2058ml of golytely . No evidence of obstruction or severe constipation on CT scan    Dr [name] MD,MRCP [facility]) Gastroenterology/Hepatology Pager: [phone]

## 2017-01-12 NOTE — Progress Notes (Signed)
Pt started oral contrast at 0943. There was no order for oral contrast for the RN to scan.

## 2017-01-13 ENCOUNTER — Inpatient Hospital Stay: Payer: Medicare Other

## 2017-01-13 LAB — BASIC METABOLIC PANEL
ANION GAP: 9 (ref 5–15)
BUN: 14 mg/dL (ref 6–20)
CALCIUM: 8 mg/dL — AB (ref 8.9–10.3)
CHLORIDE: 97 mmol/L — AB (ref 101–111)
CO2: 31 mmol/L (ref 22–32)
Creatinine, Ser: 4.81 mg/dL — ABNORMAL HIGH (ref 0.61–1.24)
GFR calc Af Amer: 14 mL/min — ABNORMAL LOW (ref >60)
GFR calc non Af Amer: 12 mL/min — ABNORMAL LOW (ref >60)
GLUCOSE: 105 mg/dL — AB (ref 65–99)
POTASSIUM: 4 mmol/L (ref 3.5–5.1)
Sodium: 137 mmol/L (ref 135–145)

## 2017-01-13 LAB — CBC
HEMATOCRIT: 30.7 % — AB (ref 40.0–52.0)
HEMOGLOBIN: 9.5 g/dL — AB (ref 13.0–18.0)
MCH: 25.5 pg — AB (ref 26.0–34.0)
MCHC: 30.9 g/dL — AB (ref 32.0–36.0)
MCV: 82.4 fL (ref 80.0–100.0)
Platelets: 311 10*3/uL (ref 150–440)
RBC: 3.72 MIL/uL — ABNORMAL LOW (ref 4.40–5.90)
RDW: 19.9 % — ABNORMAL HIGH (ref 11.5–14.5)
WBC: 9.3 10*3/uL (ref 3.8–10.6)

## 2017-01-13 LAB — BODY FLUID CULTURE: CULTURE: NO GROWTH

## 2017-01-13 LAB — GLUCOSE, CAPILLARY: Glucose-Capillary: 135 mg/dL — ABNORMAL HIGH (ref 65–99)

## 2017-01-13 MED ORDER — LACTULOSE 10 GM/15ML PO SOLN
30.0000 g | Freq: Three times a day (TID) | ORAL | Status: DC
  Administered 2017-01-13: 30 g via ORAL
  Filled 2017-01-13: qty 60

## 2017-01-13 MED ORDER — FLEET ENEMA 7-19 GM/118ML RE ENEM
1.0000 | ENEMA | Freq: Once | RECTAL | Status: AC
  Administered 2017-01-13: 1 via RECTAL

## 2017-01-13 MED ORDER — CYCLOBENZAPRINE HCL 10 MG PO TABS
5.0000 mg | ORAL_TABLET | Freq: Three times a day (TID) | ORAL | Status: DC | PRN
  Administered 2017-01-13: 5 mg via ORAL
  Filled 2017-01-13: qty 1

## 2017-01-13 MED ORDER — PEG 3350-KCL-NA BICARB-NACL 420 G PO SOLR
2000.0000 mL | Freq: Once | ORAL | Status: AC
  Administered 2017-01-13: 2000 mL via ORAL
  Filled 2017-01-13: qty 4000

## 2017-01-13 NOTE — Progress Notes (Signed)
Patient discharged via w/c and volunteer. Discharge instructions given to patient. Patient denied questions. IV removed without complication.

## 2017-01-13 NOTE — Care Management (Signed)
[facility] HD liasion notified of discharge

## 2017-01-13 NOTE — Progress Notes (Signed)
Pt complained of soreness once more in neck, Primary nurse refused tylenol. Primary nurse paged and spoke to Dr. Marcille Blanco. No further orders given. Primary nurse to offer tylenol once more.

## 2017-01-13 NOTE — Progress Notes (Signed)
Stanley [name] at [place] NAME: Stanley [name]    MR#:  [id number]  DATE OF BIRTH:  10/11/1960  SUBJECTIVE:   Patient still has not had a bowel movement after 8-9 days. He says this is not unusual for him. CT scan yesterday did not show evidence of ileus. He is complaining of neck pain.  REVIEW OF SYSTEMS:    Review of Systems  Constitutional: Negative for fever, chills weight loss HENT: Negative for ear pain, nosebleeds, congestion, facial swelling, rhinorrhea, , neck stiffness and ear discharge.   ++ neck pain  Respiratory: Negative for cough, shortness of breath, wheezing  Cardiovascular: Negative for chest pain, palpitations and leg swelling.  Gastrointestinal:no dark stools and denies abdominal pain today +constipation  Genitourinary: Negative for dysuria, urgency, frequency, hematuria Musculoskeletal: Negative for back pain or joint pain Neurological: Negative for dizziness, seizures, syncope, focal weakness,  numbness and headaches.  Hematological: Does not bruise/bleed easily.   DRUG ALLERGIES:  No Known Allergies  VITALS:  Blood pressure 129/72, pulse 80, temperature 98 F (36.7 C), temperature source Oral, resp. rate 18, height 6\' 3"  (1.905 m), weight 72.2 kg (159 lb 2.8 oz), SpO2 100 %.  PHYSICAL EXAMINATION:  Constitutional: Appears well-developed and well-nourished. No distress. HENT: Normocephalic. Marland Kitchen Oropharynx is clear and moist.  Eyes: Conjunctivae and EOM are normal. PERRLA, no scleral icterus.  Neck: Normal ROM. Neck supple. No JVD. No tracheal deviation. CVS: RRR, S1/S2 +, 3/6 SEM, no gallops, no carotid bruit.  Pulmonary: Effort and breath sounds normal, no stridor, rhonchi, wheezes, but has bibasilar rales.  Abdominal: Soft. BS hypoactive, slightlly distended NO rebound or guarding.  Musculoskeletal: Normal range of motion. No edema and no tenderness.  Neuro: Alert. CN 2-12 grossly intact. No focal deficits. Skin:  Skin is warm and dry. No rash noted. Psychiatric: Normal mood and affect.   LABORATORY PANEL:   CBC  Recent Labs Lab 01/13/17 0422  WBC 9.3  HGB 9.5*  HCT 30.7*  PLT 311   ------------------------------------------------------------------------------------------------------------------  Chemistries   Recent Labs Lab 01/08/17 0524  01/13/17 0422  NA 138  < > 137  K 4.8  < > 4.0  CL 99*  < > 97*  CO2 26  < > 31  GLUCOSE 120*  < > 105*  BUN 33*  < > 14  CREATININE 6.59*  < > 4.81*  CALCIUM 8.4*  < > 8.0*  AST 20  --   --   ALT 16*  --   --   ALKPHOS 117  --   --   BILITOT 0.7  --   --   < > = values in this interval not displayed. ------------------------------------------------------------------------------------------------------------------  Cardiac Enzymes No results for input(s): TROPONINI in the last 168 hours. ------------------------------------------------------------------------------------------------------------------  RADIOLOGY:  Dg Abd 1 View  Result Date: 01/12/2017 CLINICAL DATA:  Followup ileus. EXAM: ABDOMEN - 1 VIEW COMPARISON:  01/11/2017 FINDINGS: Improved bowel gas pattern since prior study. There is less distention of the colon. Scattered loops of small bowel of air but no distention. The soft tissue shadows of the abdomen are grossly maintained. Stable advanced vascular calcifications with iliac artery stents noted. The bony structures are grossly normal. IMPRESSION: Improving bowel gas pattern with less distention of the colon. Electronically Signed   By: Marijo Sanes M.D.   On: 01/12/2017 07:49   Dg Abd 1 View  Result Date: 01/11/2017 CLINICAL DATA:  Abdominal pain EXAM: ABDOMEN - 1 VIEW COMPARISON:  05/16/2015 FINDINGS: Diffuse gaseous distention of colon with relative rectosigmoid sparing. The rectosigmoid segments do contain gas, arguing against a low obstruction. Stool is seen multiple segments, no rectal impaction. Accounting for stool  there is no suspected pneumatosis. No concerning mass effect or gas collection. Advanced atherosclerosis. Bi-iliac stenting. IMPRESSION: Diffuse gaseous distension of colon, question colonic ileus or lactulose side effect. Diffuse formed stool without impaction. Electronically Signed   By: Monte Fantasia M.D.   On: 01/11/2017 10:21   Ct Abdomen Pelvis W Contrast  Result Date: 01/12/2017 CLINICAL DATA:  56 year old male with history of end-stage renal disease on hemodialysis, chronic iron deficiency anemia, liver cirrhosis from EtOH chronic GI blood loss due to small bowel angiectasia, polysubstance abuse and noncompliance who presents to the ER with abdominal pain, melena and shortness of breath.Pt c/o abdominal pain from severe abdominal distention from ascites and now with severe constipation. States last BM was 8 days ago. EXAM: CT ABDOMEN AND PELVIS WITH CONTRAST TECHNIQUE: Multidetector CT imaging of the abdomen and pelvis was performed using the standard protocol following bolus administration of intravenous contrast. CONTRAST:  164mL ISOVUE-300 IOPAMIDOL (ISOVUE-300) INJECTION 61% COMPARISON:  None. FINDINGS: Lower chest: Heart moderately enlarged. Mild dependent subsegmental atelectasis mostly on the left. No acute findings. Hepatobiliary: Liver normal in size. Liver normal in size and attenuation. No mass or focal lesion. Gallbladder remarkable. No bile duct dilation. Pancreas: Unremarkable. No pancreatic ductal dilatation or surrounding inflammatory changes. Spleen: Borderline enlarged, measuring 13.5 x 11.2 x 4.7 cm. No mass or focal lesion. Adrenals/Urinary Tract: No adrenal masses. Bilateral marked renal atrophy with multiple low-density renal masses consistent with cysts. No hydronephrosis. Ureters normal in course and in caliber. Bladder is unremarkable. Stomach/Bowel: Moderate distention of the stomach. No wall thickening or adjacent inflammation. Normal small bowel. Mild colonic distention with  mild to moderate increase colonic stool. No colonic wall thickening or adjacent inflammation. Normal appendix visualized. Vascular/Lymphatic: Diffuse aortic atherosclerotic calcifications and ectasia. Greatest diameter is infrarenal, 2.5 cm. Significant aortic branch vessel atherosclerotic calcifications. No pathologically enlarged lymph nodes. Reproductive: Mild enlargement of prostate gland. Other: Moderate ascites distends the abdomen. No abdominal wall hernia. Musculoskeletal: No acute fracture or acute finding. No osteoblastic or osteolytic lesions. Bold right lower lateral rib fractures. IMPRESSION: 1. No acute abnormalities within the abdomen or pelvis. 2. Moderate ascites. 3. Marked renal atrophy and acquired renal cystic disease consistent with the history of end-stage renal disease. 4. Although moderate increased stool throughout the colon with mild colonic distention. No bowel obstruction or inflammation. 5. Aortic atherosclerosis. 6. Aortic ectasia. Ectatic abdominal aorta at risk for aneurysm development. Recommend followup by ultrasound in 5 years. This recommendation follows ACR consensus guidelines: White Paper of the ACR Incidental Findings Committee II on Vascular Findings. J Am Coll Radiol 2013; 10:789-794. Electronically Signed   By: Lajean Manes M.D.   On: 01/12/2017 11:31     ASSESSMENT AND PLAN:   56 year old male with history of end-stage renal disease on hemodialysis, chronic iron deficiency anemia, liver cirrhosis from EtOH chronic GI blood loss due to small bowel angiectasia, polysubstance abuse and noncompliance who presents to the ER with abdominal pain, melena and shortness of breath. Patient did leave AMA on October 13.   1. Abdominal pain from severe abdominal distention from ascites and now with severe constipation Diagnostic and therapeutic paracentesis with 9 L fluid. Continue Rocephin due to ascites and GI bleed CT scan did not show evidence of ileus or bowel  obstruction Continue aggressive bowel  regimen with enema and GoLYTELY as well as lactulose All narcotics have been discontinued.  2. Upper GI bleed with acute on chronic blood loss anemia: Patient has a history of small bowel AVMs and has been requested to follow-up with [facility] for small bowel enteroscopy however he has not done so. He had colonoscopy which showed nonbleeding AVMs in 2018 which were ablated and enteroscopy at that time which shows nonbleeding duodenal AVMs  He underwent EGD in the past which showed gastritis   He is status post 2 units. Hemoglobin remained stable  Continue PPI   Patient will need outpatient follow-up with [facility] for small bowel enteroscopy  3. [name]: Dialysis planned for Tuesday, Thursday and Saturday   4.Tobacco dependence: Patient is encouraged to quit smoking.    5. CAD: Continue ASA, Statin, labetolol 6. Chronic diastolic heart failure with pEF: Continue Lasix  7. Peripheral neuropathy: Continue Neurontin  8. Neck pain: Seems musculoskeletal in nature Try Flexeril  If he has bowel movement then plan for discharge today. Management plans discussed with the patient and he is in agreement.  CODE STATUS: FULL  TOTAL TIME TAKING CARE OF THIS PATIENT: 24 minutes.    D/w dr Shon Millet M.D on 01/13/2017 at 9:20 AM  Between 7am to 6pm - Pager - [phone] After 6pm go to [email or web address] - password [email or web address] [facility]  Office  [phone]  CC: Primary care physician; [name], MD  Note: This dictation was prepared with Dragon dictation along with smaller phrase technology. Any transcriptional errors that result from this process are unintentional.

## 2017-01-13 NOTE — Progress Notes (Signed)
Pt complaining that flexeril did not help neck. Primary nurse paged and spoke to Dr. Jannifer Franklin. Dr. Jannifer Franklin to place orders for additional medication. Primary nurse to continue to monitor.

## 2017-01-17 DIAGNOSIS — N2581 Secondary hyperparathyroidism of renal origin: Secondary | ICD-10-CM | POA: Diagnosis not present

## 2017-01-17 DIAGNOSIS — N186 End stage renal disease: Secondary | ICD-10-CM | POA: Diagnosis not present

## 2017-01-17 DIAGNOSIS — D509 Iron deficiency anemia, unspecified: Secondary | ICD-10-CM | POA: Diagnosis not present

## 2017-01-17 DIAGNOSIS — D649 Anemia, unspecified: Secondary | ICD-10-CM | POA: Diagnosis not present

## 2017-01-20 DIAGNOSIS — K7031 Alcoholic cirrhosis of liver with ascites: Secondary | ICD-10-CM | POA: Diagnosis not present

## 2017-01-20 DIAGNOSIS — Z1159 Encounter for screening for other viral diseases: Secondary | ICD-10-CM | POA: Diagnosis not present

## 2017-01-20 DIAGNOSIS — D500 Iron deficiency anemia secondary to blood loss (chronic): Secondary | ICD-10-CM | POA: Diagnosis not present

## 2017-01-21 DIAGNOSIS — N186 End stage renal disease: Secondary | ICD-10-CM | POA: Diagnosis not present

## 2017-01-21 DIAGNOSIS — D649 Anemia, unspecified: Secondary | ICD-10-CM | POA: Diagnosis not present

## 2017-01-21 DIAGNOSIS — N2581 Secondary hyperparathyroidism of renal origin: Secondary | ICD-10-CM | POA: Diagnosis not present

## 2017-01-21 DIAGNOSIS — D509 Iron deficiency anemia, unspecified: Secondary | ICD-10-CM | POA: Diagnosis not present

## 2017-01-22 ENCOUNTER — Inpatient Hospital Stay
Admission: EM | Admit: 2017-01-22 | Discharge: 2017-01-25 | DRG: 377 | Disposition: A | Discharge status: Home / Self Care | Payer: Medicare Other | Attending: Internal Medicine | Admitting: Internal Medicine

## 2017-01-22 ENCOUNTER — Encounter: Payer: Self-pay | Admitting: *Deleted

## 2017-01-22 ENCOUNTER — Other Ambulatory Visit: Payer: Self-pay

## 2017-01-22 DIAGNOSIS — D631 Anemia in chronic kidney disease: Secondary | ICD-10-CM | POA: Diagnosis present

## 2017-01-22 DIAGNOSIS — E440 Moderate protein-calorie malnutrition: Secondary | ICD-10-CM | POA: Diagnosis present

## 2017-01-22 DIAGNOSIS — R7989 Other specified abnormal findings of blood chemistry: Secondary | ICD-10-CM | POA: Diagnosis not present

## 2017-01-22 DIAGNOSIS — K219 Gastro-esophageal reflux disease without esophagitis: Secondary | ICD-10-CM | POA: Diagnosis present

## 2017-01-22 DIAGNOSIS — K7030 Alcoholic cirrhosis of liver without ascites: Secondary | ICD-10-CM | POA: Diagnosis present

## 2017-01-22 DIAGNOSIS — F17210 Nicotine dependence, cigarettes, uncomplicated: Secondary | ICD-10-CM | POA: Diagnosis present

## 2017-01-22 DIAGNOSIS — D649 Anemia, unspecified: Secondary | ICD-10-CM | POA: Diagnosis not present

## 2017-01-22 DIAGNOSIS — I2510 Atherosclerotic heart disease of native coronary artery without angina pectoris: Secondary | ICD-10-CM | POA: Diagnosis present

## 2017-01-22 DIAGNOSIS — I132 Hypertensive heart and chronic kidney disease with heart failure and with stage 5 chronic kidney disease, or end stage renal disease: Secondary | ICD-10-CM | POA: Diagnosis present

## 2017-01-22 DIAGNOSIS — E1122 Type 2 diabetes mellitus with diabetic chronic kidney disease: Secondary | ICD-10-CM | POA: Diagnosis present

## 2017-01-22 DIAGNOSIS — N179 Acute kidney failure, unspecified: Secondary | ICD-10-CM | POA: Diagnosis not present

## 2017-01-22 DIAGNOSIS — N186 End stage renal disease: Secondary | ICD-10-CM | POA: Diagnosis not present

## 2017-01-22 DIAGNOSIS — D62 Acute posthemorrhagic anemia: Secondary | ICD-10-CM | POA: Diagnosis present

## 2017-01-22 DIAGNOSIS — I5033 Acute on chronic diastolic (congestive) heart failure: Secondary | ICD-10-CM | POA: Diagnosis not present

## 2017-01-22 DIAGNOSIS — K7290 Hepatic failure, unspecified without coma: Secondary | ICD-10-CM | POA: Diagnosis present

## 2017-01-22 DIAGNOSIS — K921 Melena: Secondary | ICD-10-CM | POA: Diagnosis not present

## 2017-01-22 DIAGNOSIS — N2581 Secondary hyperparathyroidism of renal origin: Secondary | ICD-10-CM | POA: Diagnosis present

## 2017-01-22 DIAGNOSIS — R188 Other ascites: Secondary | ICD-10-CM | POA: Diagnosis not present

## 2017-01-22 DIAGNOSIS — E1151 Type 2 diabetes mellitus with diabetic peripheral angiopathy without gangrene: Secondary | ICD-10-CM | POA: Diagnosis present

## 2017-01-22 DIAGNOSIS — Z7982 Long term (current) use of aspirin: Secondary | ICD-10-CM

## 2017-01-22 DIAGNOSIS — E785 Hyperlipidemia, unspecified: Secondary | ICD-10-CM | POA: Diagnosis present

## 2017-01-22 DIAGNOSIS — I509 Heart failure, unspecified: Secondary | ICD-10-CM | POA: Diagnosis not present

## 2017-01-22 DIAGNOSIS — E875 Hyperkalemia: Secondary | ICD-10-CM | POA: Diagnosis present

## 2017-01-22 DIAGNOSIS — Z992 Dependence on renal dialysis: Secondary | ICD-10-CM

## 2017-01-22 DIAGNOSIS — Z7951 Long term (current) use of inhaled steroids: Secondary | ICD-10-CM

## 2017-01-22 DIAGNOSIS — Z9115 Patient's noncompliance with renal dialysis: Secondary | ICD-10-CM

## 2017-01-22 DIAGNOSIS — E1142 Type 2 diabetes mellitus with diabetic polyneuropathy: Secondary | ICD-10-CM | POA: Diagnosis present

## 2017-01-22 DIAGNOSIS — Z6820 Body mass index (BMI) 20.0-20.9, adult: Secondary | ICD-10-CM

## 2017-01-22 DIAGNOSIS — Z951 Presence of aortocoronary bypass graft: Secondary | ICD-10-CM

## 2017-01-22 LAB — COMPREHENSIVE METABOLIC PANEL
ALBUMIN: 2.4 g/dL — AB (ref 3.5–5.0)
ALT: 18 U/L (ref 17–63)
AST: 29 U/L (ref 15–41)
Alkaline Phosphatase: 183 U/L — ABNORMAL HIGH (ref 38–126)
Anion gap: 15 (ref 5–15)
BUN: 47 mg/dL — AB (ref 6–20)
CALCIUM: 8.9 mg/dL (ref 8.9–10.3)
CHLORIDE: 95 mmol/L — AB (ref 101–111)
CO2: 30 mmol/L (ref 22–32)
CREATININE: 9.49 mg/dL — AB (ref 0.61–1.24)
GFR calc Af Amer: 6 mL/min — ABNORMAL LOW (ref >60)
GFR calc non Af Amer: 5 mL/min — ABNORMAL LOW (ref >60)
GLUCOSE: 91 mg/dL (ref 65–99)
Potassium: 6.1 mmol/L — ABNORMAL HIGH (ref 3.5–5.1)
SODIUM: 140 mmol/L (ref 135–145)
Total Bilirubin: 0.5 mg/dL (ref 0.3–1.2)
Total Protein: 6.7 g/dL (ref 6.5–8.1)

## 2017-01-22 LAB — CBC WITH DIFFERENTIAL/PLATELET
BASOS ABS: 0.1 10*3/uL (ref 0–0.1)
Basophils Relative: 1 %
EOS PCT: 5 %
Eosinophils Absolute: 0.6 10*3/uL (ref 0–0.7)
HCT: 27.7 % — ABNORMAL LOW (ref 40.0–52.0)
HEMOGLOBIN: 8.5 g/dL — AB (ref 13.0–18.0)
LYMPHS PCT: 11 %
Lymphs Abs: 1.1 10*3/uL (ref 1.0–3.6)
MCH: 25.6 pg — ABNORMAL LOW (ref 26.0–34.0)
MCHC: 30.7 g/dL — ABNORMAL LOW (ref 32.0–36.0)
MCV: 83.3 fL (ref 80.0–100.0)
Monocytes Absolute: 1.1 10*3/uL — ABNORMAL HIGH (ref 0.2–1.0)
Monocytes Relative: 11 %
NEUTROS ABS: 7.3 10*3/uL — AB (ref 1.4–6.5)
Neutrophils Relative %: 72 %
PLATELETS: 379 10*3/uL (ref 150–440)
RBC: 3.32 MIL/uL — AB (ref 4.40–5.90)
RDW: 21.8 % — ABNORMAL HIGH (ref 11.5–14.5)
WBC: 10.2 10*3/uL (ref 3.8–10.6)

## 2017-01-22 LAB — PREPARE RBC (CROSSMATCH)

## 2017-01-22 MED ORDER — ACETAMINOPHEN 325 MG PO TABS: 650.0000 mg | ORAL_TABLET | Freq: Four times a day (QID) | ORAL | Status: DC | PRN

## 2017-01-22 MED ORDER — LABETALOL HCL 100 MG PO TABS
100.0000 mg | ORAL_TABLET | Freq: Every day | ORAL | Status: DC
  Administered 2017-01-25: 100 mg via ORAL
  Filled 2017-01-22 (×3): qty 1

## 2017-01-22 MED ORDER — SEVELAMER CARBONATE 800 MG PO TABS
2400.0000 mg | ORAL_TABLET | Freq: Three times a day (TID) | ORAL | Status: DC
Start: 2017-01-23 — End: 2017-01-25
  Administered 2017-01-24 – 2017-01-25 (×3): 2400 mg via ORAL
  Filled 2017-01-22 (×5): qty 3

## 2017-01-22 MED ORDER — FUROSEMIDE 10 MG/ML IJ SOLN
80.0000 mg | Freq: Once | INTRAMUSCULAR | Status: AC
  Administered 2017-01-23: 80 mg via INTRAVENOUS
  Filled 2017-01-22: qty 8

## 2017-01-22 MED ORDER — SODIUM CHLORIDE 0.9% FLUSH: 3.0000 mL | Freq: Two times a day (BID) | INTRAVENOUS | Status: DC

## 2017-01-22 MED ORDER — SODIUM CHLORIDE 0.9 % IV SOLN: 250.0000 mL | INTRAVENOUS | Status: DC | PRN

## 2017-01-22 MED ORDER — NITROGLYCERIN 0.4 MG SL SUBL: 0.4000 mg | SUBLINGUAL_TABLET | SUBLINGUAL | Status: DC | PRN

## 2017-01-22 MED ORDER — CIPROFLOXACIN HCL 500 MG PO TABS
250.0000 mg | ORAL_TABLET | Freq: Every day | ORAL | Status: DC
  Administered 2017-01-23 – 2017-01-25 (×3): 250 mg via ORAL
  Filled 2017-01-22 (×3): qty 1

## 2017-01-22 MED ORDER — PNEUMOCOCCAL VAC POLYVALENT 25 MCG/0.5ML IJ INJ: 0.5000 mL | INJECTION | INTRAMUSCULAR | Status: DC

## 2017-01-22 MED ORDER — FUROSEMIDE 40 MG PO TABS
80.0000 mg | ORAL_TABLET | Freq: Every day | ORAL | Status: DC
  Administered 2017-01-23 – 2017-01-25 (×2): 80 mg via ORAL
  Filled 2017-01-22 (×2): qty 2

## 2017-01-22 MED ORDER — PANTOPRAZOLE SODIUM 40 MG PO TBEC
40.0000 mg | DELAYED_RELEASE_TABLET | Freq: Every day | ORAL | Status: DC
  Administered 2017-01-23 – 2017-01-25 (×3): 40 mg via ORAL
  Filled 2017-01-22 (×3): qty 1

## 2017-01-22 MED ORDER — SODIUM CHLORIDE 0.9% FLUSH
3.0000 mL | Freq: Two times a day (BID) | INTRAVENOUS | Status: DC
  Administered 2017-01-23 – 2017-01-25 (×6): 3 mL via INTRAVENOUS

## 2017-01-22 MED ORDER — SODIUM CHLORIDE 0.9% FLUSH: 3.0000 mL | INTRAVENOUS | Status: DC | PRN

## 2017-01-22 MED ORDER — LACTULOSE 10 GM/15ML PO SOLN
20.0000 g | Freq: Three times a day (TID) | ORAL | Status: DC
  Administered 2017-01-23 – 2017-01-25 (×4): 20 g via ORAL
  Filled 2017-01-22 (×5): qty 30

## 2017-01-22 MED ORDER — ACETAMINOPHEN 650 MG RE SUPP: 650.0000 mg | Freq: Four times a day (QID) | RECTAL | Status: DC | PRN

## 2017-01-22 MED ORDER — ATORVASTATIN CALCIUM 20 MG PO TABS
40.0000 mg | ORAL_TABLET | Freq: Every day | ORAL | Status: DC
Start: 2017-01-23 — End: 2017-01-25
  Administered 2017-01-23 – 2017-01-25 (×3): 40 mg via ORAL
  Filled 2017-01-22 (×3): qty 2

## 2017-01-22 MED ORDER — DIALYVITE SUPREME D 3 MG PO TABS: 1.0000 | ORAL_TABLET | Freq: Every day | ORAL | Status: DC

## 2017-01-22 MED ORDER — MOMETASONE FURO-FORMOTEROL FUM 200-5 MCG/ACT IN AERO
2.0000 | INHALATION_SPRAY | Freq: Two times a day (BID) | RESPIRATORY_TRACT | Status: DC
  Administered 2017-01-23: 2 via RESPIRATORY_TRACT
  Filled 2017-01-22: qty 8.8

## 2017-01-22 MED ORDER — IPRATROPIUM-ALBUTEROL 0.5-2.5 (3) MG/3ML IN SOLN: 3.0000 mL | Freq: Four times a day (QID) | RESPIRATORY_TRACT | Status: DC | PRN

## 2017-01-22 MED ORDER — SODIUM POLYSTYRENE SULFONATE 15 GM/60ML PO SUSP: 30.0000 g | Freq: Once | ORAL | Status: DC

## 2017-01-22 MED ORDER — SODIUM CHLORIDE 0.9 % IV SOLN
80.0000 mg | Freq: Once | INTRAVENOUS | Status: DC
  Filled 2017-01-22: qty 80

## 2017-01-22 MED ORDER — HYDROCODONE-ACETAMINOPHEN 5-325 MG PO TABS
1.0000 | ORAL_TABLET | ORAL | Status: DC | PRN
  Administered 2017-01-23 – 2017-01-24 (×6): 2 via ORAL
  Filled 2017-01-22 (×6): qty 2

## 2017-01-22 MED ORDER — SODIUM CHLORIDE 0.9 % IV BOLUS (SEPSIS): 1000.0000 mL | Freq: Once | INTRAVENOUS | Status: DC

## 2017-01-22 MED ORDER — SODIUM CHLORIDE 0.9 % IV SOLN: Freq: Once | INTRAVENOUS | Status: DC

## 2017-01-22 MED ORDER — NICOTINE 21 MG/24HR TD PT24
21.0000 mg | MEDICATED_PATCH | Freq: Every day | TRANSDERMAL | Status: DC
  Administered 2017-01-23 – 2017-01-24 (×2): 21 mg via TRANSDERMAL
  Filled 2017-01-22 (×2): qty 1

## 2017-01-22 NOTE — H&P (Signed)
[facility] [place] NAME: Stanley [name]    MR#:  [id number]  DATE OF BIRTH:  [birth year]  DATE OF ADMISSION:  01/22/2017  PRIMARY CARE PHYSICIAN: Alene Mires Elyse Jarvis, MD   REQUESTING/REFERRING PHYSICIAN:   CHIEF COMPLAINT:   Chief Complaint  Patient presents with  . Abnormal Lab    HISTORY OF PRESENT ILLNESS: Stanley [name]  is a 56 y.o. male with a known history per below, chronic melena, status post colonoscopy in June 2018 noted for NGO ectasias/polyp/few erosions/internal hemorrhoids, status post small bowel endoscopy on same date noted for a few bleeding angioid ectasias in the duodenum status post treatment/Monilial esophagitis, had routine blood work done on Friday by primary care provider, was told that his hemoglobin was 7.2 and instructed to go to [facility] system for further evaluation/care given the chronic melena, instead the patient chose to come to our emergency room for further evaluation.  In the emergency room potassium was 6.1, creatinine 9 with baseline between 4-6, hemoglobin 8.5, patient denied any symptomatology but on further interview he stated that he did have chronic generalized weakness/fatigue, tires very easily, intermittent lightheadedness, hospitalist service to see patient while in the emergency room, in no apparent distress, resting comfortably in bed noted patient mild congestive heart failure exacerbation clinically, patient did have partial hemodialysis on yesterday for 2 hours-had to be stopped due to reflux symptomatology.  Patient is now being admitted for acute on chronic anemia, mild congestive heart failure exacerbation/acute fluid overload state with end-stage renal disease, and chronic melena.  PAST MEDICAL HISTORY:   Past Medical History:  Diagnosis Date  . Alcohol abuse   . CHF (congestive heart failure) ([place])   . Cirrhosis ([place])   . Coronary artery disease 2009  . Diabetic  peripheral neuropathy ([place])   . Drug abuse ([place])   . End stage renal disease on dialysis [facility]) NEPHROLOGIST-   DR [facility]  IN [place]   HEMODIALYSIS --   TUES/  THURS/  SAT  . GERD (gastroesophageal reflux disease)   . Hyperlipidemia   . Hypertension   . PAD (peripheral artery disease) ([place])   . Renal insufficiency    Per pt, 32 oz fluid restriction per day  . S/P triple vessel bypass 06/09/2016   2009ish  . Suicidal ideation    & HOMICIDAL IDEATION --  06-16-2013   ADMITTED TO BEHAVIOR HEALTH    PAST SURGICAL HISTORY:  Past Surgical History:  Procedure Laterality Date  . CORONARY ANGIOPLASTY  ?   PT UNABLE TO TELL IF  BEFORE OR AFTER  CABG  . CORONARY ARTERY BYPASS GRAFT  2008  ([place] , [place])   3 VESSEL  . DIALYSIS FISTULA CREATION  LAST SURGERY  APPOX  2008  . PERIPHERAL ARTERIAL STENT GRAFT Left     SOCIAL HISTORY:  Social History   Tobacco Use  . Smoking status: Current Every Day Smoker    Packs/day: 0.15    Years: 40.00    Pack years: 6.00    Types: Cigarettes  . Smokeless tobacco: Never Used  Substance Use Topics  . Alcohol use: No    Comment: pt reports quitting after learning about cirrhosis    FAMILY HISTORY:  Family History  Problem Relation Age of Onset  . Colon cancer Mother   . Cancer Father   . Cancer Sister   . Kidney disease Brother     DRUG ALLERGIES: No Known Allergies  REVIEW OF  SYSTEMS:   CONSTITUTIONAL: No fever, +fatigue/weakness.  EYES: No blurred or double vision.  EARS, NOSE, AND THROAT: No tinnitus or ear pain.  RESPIRATORY: No cough, shortness of breath, wheezing or hemoptysis.  CARDIOVASCULAR: No chest pain, orthopnea, + LE b/l edema.  GASTROINTESTINAL: No nausea, vomiting, diarrhea or abdominal pain. Black stools GENITOURINARY: No dysuria, hematuria.  ENDOCRINE: No polyuria, nocturia,  HEMATOLOGY: No anemia, easy bruising or bleeding SKIN: No rash or lesion. MUSCULOSKELETAL: No joint pain or arthritis.   NEUROLOGIC: No  tingling, numbness, weakness.  PSYCHIATRY: No anxiety or depression.   MEDICATIONS AT HOME:  Prior to Admission medications   Medication Sig Start Date End Date Taking? Authorizing Provider  aspirin EC 81 MG tablet Take 1 tablet (81 mg total) by mouth daily. 11/16/16  Yes Vaughan Basta, MD  atorvastatin (LIPITOR) 40 MG tablet Take 1 tablet (40 mg total) by mouth daily. 11/16/16  Yes Vaughan Basta, MD  budesonide-formoterol [facility]) 160-4.5 MCG/ACT inhaler Inhale 2 puffs into the lungs daily. 11/16/16  Yes Vaughan Basta, MD  ciprofloxacin (CIPRO) 500 MG tablet Take 0.5 tablets (250 mg total) by mouth daily. 01/11/17  Yes Mody, Ulice Bold, MD  furosemide (LASIX) 80 MG tablet Take 1 tablet (80 mg total) by mouth daily. 11/16/16  Yes Vaughan Basta, MD  gabapentin (NEURONTIN) 300 MG capsule TAKE 1 CAPSULE BY MOUTH EVERY EVENING AND 1 EXTRA CAP ON DIALYSIS DAYS AS NEEDED FOR NERVE PAIN 08/16/16  Yes [provider]  labetalol (NORMODYNE) 100 MG tablet Take 1 tablet (100 mg total) by mouth daily. 11/16/16  Yes Vaughan Basta, MD  lactulose (CHRONULAC) 10 GM/15ML solution Take 30 mLs (20 g total) by mouth 3 (three) times daily. 01/11/17  Yes Mody, Ulice Bold, MD  Multiple Vitamins-Minerals-FA (DIALYVITE SUPREME D) 3 MG TABS Take 1 tablet (3 mg total) by mouth daily. 11/28/16  Yes Wieting, Richard, MD  nicotine (NICODERM CQ - DOSED IN MG/24 HOURS) 21 mg/24hr patch Place 1 patch (21 mg total) onto the skin daily. 01/12/17  Yes Mody, Ulice Bold, MD  pantoprazole (PROTONIX) 40 MG tablet Take 1 tablet (40 mg total) by mouth daily. 11/28/16  Yes Wieting, Richard, MD  tiotropium (SPIRIVA HANDIHALER) 18 MCG inhalation capsule Place 1 capsule (18 mcg total) into inhaler and inhale daily. 11/16/16 11/16/17 Yes Vaughan Basta, MD  albuterol (PROVENTIL HFA;VENTOLIN HFA) 108 (90 Base) MCG/ACT inhaler Inhale 4-6 puffs by mouth every 4 hours as needed for wheezing, cough, and/or shortness of  breath 11/16/16   Vaughan Basta, MD  ipratropium-albuterol (DUONEB) 0.5-2.5 (3) MG/3ML SOLN Take 3 mLs by nebulization every 6 (six) hours as needed. 11/28/16   Loletha Grayer, MD  nitroGLYCERIN (NITROSTAT) 0.4 MG SL tablet Place 1 tablet (0.4 mg total) under the tongue every 5 (five) minutes as needed. 04/12/16   Wende Bushy, MD  sevelamer carbonate (RENVELA) 800 MG tablet Take 3 tablets (2,400 mg total) by mouth 3 (three) times daily with meals. Patient not taking: Reported on 01/22/2017 11/16/16   Vaughan Basta, MD      PHYSICAL EXAMINATION:   VITAL SIGNS: Blood pressure 135/88, pulse 89, resp. rate 16, SpO2 95 %.  GENERAL:  56 y.o.-year-old patient lying in the bed with no acute distress.  Nontoxic appearing EYES: Pupils equal, round, reactive to light and accommodation. No scleral icterus. Extraocular muscles intact.  HEENT: Head atraumatic, normocephalic. Oropharynx and nasopharynx clear.  NECK:  Supple, no jugular venous distention. No thyroid enlargement, no tenderness.  LUNGS: rales bilaterally at bases. No use of accessory  muscles of respiration.  CARDIOVASCULAR: S1, S2 normal. No murmurs, rubs, or gallops.  ABDOMEN: Soft, nontender, nondistended. Bowel sounds present. No organomegaly or mass.  EXTREMITIES: + pedal edema, no cyanosis, or clubbing.  NEUROLOGIC: Cranial nerves II through XII are intact. Muscle strength 5/5 in all extremities. Sensation intact. Gait not checked.  PSYCHIATRIC: The patient is alert and oriented x 3.  SKIN: No obvious rash, lesion, or ulcer.   LABORATORY PANEL:   CBC Recent Labs  Lab 01/22/17 1814  WBC 10.2  HGB 8.5*  HCT 27.7*  PLT 379  MCV 83.3  MCH 25.6*  MCHC 30.7*  RDW 21.8*  LYMPHSABS 1.1  MONOABS 1.1*  EOSABS 0.6  BASOSABS 0.1   ------------------------------------------------------------------------------------------------------------------  Chemistries  Recent Labs  Lab 01/22/17 1814  NA 140  K 6.1*  CL  95*  CO2 30  GLUCOSE 91  BUN 47*  CREATININE 9.49*  CALCIUM 8.9  AST 29  ALT 18  ALKPHOS 183*  BILITOT 0.5   ------------------------------------------------------------------------------------------------------------------ estimated creatinine clearance is 8.8 mL/min (A) (by C-G formula based on SCr of 9.49 mg/dL (H)). ------------------------------------------------------------------------------------------------------------------ No results for input(s): TSH, T4TOTAL, T3FREE, THYROIDAB in the last 72 hours.  Invalid input(s): FREET3   Coagulation profile No results for input(s): INR, PROTIME in the last 168 hours. ------------------------------------------------------------------------------------------------------------------- No results for input(s): DDIMER in the last 72 hours. -------------------------------------------------------------------------------------------------------------------  Cardiac Enzymes No results for input(s): CKMB, TROPONINI, MYOGLOBIN in the last 168 hours.  Invalid input(s): CK ------------------------------------------------------------------------------------------------------------------ Invalid input(s): POCBNP  ---------------------------------------------------------------------------------------------------------------  Urinalysis    Component Value Date/Time   COLORURINE YELLOW (A) 05/03/2015 0815   APPEARANCEUR CLEAR (A) 05/03/2015 0815   APPEARANCEUR Clear 11/13/2013 1314   LABSPEC 1.008 05/03/2015 0815   LABSPEC 1.014 11/13/2013 1314   PHURINE 9.0 (H) 05/03/2015 0815   GLUCOSEU NEGATIVE 05/03/2015 0815   GLUCOSEU 50 mg/dL 11/13/2013 1314   HGBUR NEGATIVE 05/03/2015 0815   BILIRUBINUR NEGATIVE 05/03/2015 0815   BILIRUBINUR Negative 11/13/2013 1314   KETONESUR NEGATIVE 05/03/2015 0815   PROTEINUR 100 (A) 05/03/2015 0815   UROBILINOGEN 0.2 06/22/2014 1230   NITRITE NEGATIVE 05/03/2015 0815   LEUKOCYTESUR NEGATIVE 05/03/2015  0815   LEUKOCYTESUR Negative 11/13/2013 1314     RADIOLOGY: No results found.  EKG: Orders placed or performed during the hospital encounter of 01/22/17  . ED EKG  . ED EKG    IMPRESSION AND PLAN: 1 acute on chronic symptomatic anemia Most likely from chronic melena, noted endoscopy in June 2018 per HPI, was instructed to proceed to go to [facility] for further evaluation for this malady Consult gastroenterology for expert opinion, Protonix drip, H&H every 6 hours, type and hold 4 units packed red blood cells, discontinue aspirin, and continue close medical monitoring  2 acute fluid overload state Dialysis cut short on yesterday due to reflux [name] Tuesdays/Thursdays/Saturdays Consult nephrology for expert opinion/hemodialysis needs  3 acute on chronic congestive heart failure exacerbation Secondary to acute fluid overload state from end-stage renal disease Give Lasix 80 mg IV x1, nephrology consulted for hemodialysis needs, continue Lipitor, aspirin discontinued for chronic melena, strict I&O monitoring, daily weights, BMP daily  4 chronic cirrhosis  Stable  continue Lasix, Cipro, and lactulose  5 history of alcoholism Placed on alcohol withdrawal protocol  Full code Condition stable Prognosis poor DVT prophylaxis-SCDs/TED hose Disposition Home in 1-2 days  All the records are reviewed and case discussed with ED provider. Management plans discussed with the patient, family and they are  in agreement.  CODE STATUS: Code Status History    Date Active Date Inactive Code Status Order ID Comments User Context   01/07/2017 14:45 01/13/2017 18:12 Full Code [id number]  Idelle Crouch, MD Inpatient   12/22/2016 14:31 12/24/2016 18:04 Full Code [id number]  Hillary Bow, MD ED   12/20/2016 20:20 12/21/2016 18:53 Full Code [id number]  Demetrios Loll, MD Inpatient   11/26/2016 10:30 11/28/2016 15:22 Full Code [id number]  Henreitta Leber, MD  Inpatient   11/15/2016 19:30 11/16/2016 18:22 Full Code [id number]  Fritzi Mandes, MD Inpatient   10/29/2016 14:53 10/30/2016 15:06 Full Code [id number]  Loletha Grayer, MD ED   09/15/2016 17:57 09/16/2016 18:17 Full Code [id number]  Dustin Flock, MD ED   08/16/2016 09:45 08/18/2016 18:16 Full Code [id number]  Hillary Bow, MD ED   08/09/2016 16:59 08/12/2016 20:34 Full Code [id number]  Fritzi Mandes, MD Inpatient   07/03/2016 21:54 07/05/2016 21:22 Full Code [id number]  Demetrios Loll, MD Inpatient   06/24/2016 04:23 06/26/2016 15:46 Full Code [id number]  Saundra Shelling, MD Inpatient   06/14/2016 14:53 06/19/2016 21:04 Full Code [id number]  Hillary Bow, MD ED   05/31/2016 06:42 06/01/2016 21:30 Full Code [id number]  Saundra Shelling, MD Inpatient   05/05/2016 23:09 05/10/2016 21:57 Full Code [id number]  Vianne Bulls, MD Inpatient   04/16/2016 03:56 04/17/2016 18:27 Full Code [id number]  Saundra Shelling, MD Inpatient   01/19/2016 15:13 01/22/2016 14:14 Full Code [id number]  Loletha Grayer, MD ED   09/24/2015 10:33 09/25/2015 13:31 Full Code [id number]  Loletha Grayer, MD ED   05/16/2015 14:17 05/17/2015 20:57 Full Code [id number]  Idelle Crouch, MD Inpatient   04/30/2015 20:08 05/08/2015 19:40 Full Code [id number]  Vaughan Basta, MD Inpatient   04/06/2015 05:07 04/07/2015 17:36 Full Code [id number]  Lance Coon, MD Inpatient   03/23/2015 11:07 03/27/2015 12:46 Full Code [id number]  Bettey Costa, MD Inpatient   06/19/2013 11:32 06/21/2013 17:49 Full Code [id number]  Cristal Ford, DO Inpatient   06/16/2013 17:26 06/19/2013 11:32 Full Code [id number]  Margarita Mail, PA-C ED   06/05/2013 15:33 06/06/2013 18:56 Full Code [id number]  Theodoro Kos, DO Inpatient   06/02/2013 09:01 06/05/2013 15:33 Full Code [id number]  Velvet Bathe, MD Inpatient   06/02/2013 08:40 06/02/2013 09:01 Full Code [id number]  Velvet Bathe, MD Inpatient       TOTAL TIME TAKING CARE OF THIS PATIENT: 45 minutes.    Avel Peace Aubrina Nieman M.D on 01/22/2017   Between 7am to 6pm  - Pager - [phone]  After 6pm go to [email or web address] - password [email or web address] [facility]  Office  [phone]  CC: Primary care physician; [name], MD   Note: This dictation was prepared with Dragon dictation along with smaller phrase technology. Any transcriptional errors that result from this process are unintentional.

## 2017-01-22 NOTE — ED Provider Notes (Addendum)
[facility] Emergency Department Provider Note   ____________________________________________   First MD Initiated Contact with Patient 01/22/17 1830     (approximate)  I have reviewed the triage vital signs and the nursing notes.   HISTORY  Chief Complaint Abnormal Lab   HPI Stanley [name] is a 56 y.o. male Who was sent here by his doctor because his hemoglobin was 7. something. He says he is losing blood. No one knows where. He says they want him to go down to [facility] for further evaluation but he cannot find a ride.patient was sent here today as I said for symptomatic anemia. He is weak and lightheaded and tires easily yet   Past Medical History:  Diagnosis Date  . Alcohol abuse   . CHF (congestive heart failure) ([place])   . Cirrhosis ([place])   . Coronary artery disease 2009  . Diabetic peripheral neuropathy ([place])   . Drug abuse ([place])   . End stage renal disease on dialysis [facility]) NEPHROLOGIST-   DR [facility]  IN [place]   HEMODIALYSIS --   TUES/  THURS/  SAT  . GERD (gastroesophageal reflux disease)   . Hyperlipidemia   . Hypertension   . PAD (peripheral artery disease) ([place])   . Renal insufficiency    Per pt, 32 oz fluid restriction per day  . S/P triple vessel bypass 06/09/2016   2009ish  . Suicidal ideation    & HOMICIDAL IDEATION --  06-16-2013   ADMITTED TO BEHAVIOR HEALTH    Patient Active Problem List   Diagnosis Date Noted  . Shortness of breath 11/26/2016  . COPD (chronic obstructive pulmonary disease) ([place]) 10/30/2016  . COPD exacerbation ([place]) 10/29/2016  . Anemia   . Heme positive stool   . Ulceration of intestine   . Benign neoplasm of transverse colon   . Acute gastrointestinal hemorrhage   . Esophageal candidiasis ([place])   . Angiodysplasia of intestinal tract   . Acute respiratory failure with hypoxia ([place]) 07/03/2016  . GI bleeding 06/24/2016  . Rectal bleeding 06/14/2016  . Anemia of chronic disease 06/01/2016  . MRSA  carrier 06/01/2016  . Chronic renal failure 05/23/2016  . Ischemic heart disease 05/23/2016  . Angiodysplasia of small intestine   . Melena   . Small bowel bleed not requiring more than 4 units of blood in 24 hours, ICU, or surgery   . Anemia due to chronic blood loss   . Abdominal pain 05/05/2016  . Acute posthemorrhagic anemia 04/17/2016  . Gastrointestinal bleed 04/17/2016  . History of esophagogastroduodenoscopy (EGD) 04/17/2016  . Elevated troponin 04/17/2016  . Alcohol abuse 04/17/2016  . Upper GI bleed 01/19/2016  . Blood in stool   . Angiodysplasia of stomach and duodenum with hemorrhage   . Gastritis   . Esophagitis, unspecified   . GI bleed 05/16/2015  . Acute GI bleeding   . Symptomatic anemia 04/30/2015  . HTN (hypertension) 04/06/2015  . GERD (gastroesophageal reflux disease) 04/06/2015  . HLD (hyperlipidemia) 04/06/2015  . Dyspnea 04/06/2015  . Cirrhosis of liver with ascites ([place]) 04/06/2015  . Ascites 04/06/2015  . GIB (gastrointestinal bleeding) 03/23/2015  . Homicidal ideation 06/19/2013  . Suicidal intent 06/19/2013  . Homicidal ideations 06/19/2013  . Hyperkalemia 06/16/2013  . Mandible fracture ([place]) 06/05/2013  . Fracture, mandible ([place]) 06/02/2013  . Coronary atherosclerosis of native coronary artery 06/02/2013  . ESRD on dialysis ([place]) 06/02/2013  . Mandible open fracture ([place]) 06/02/2013    Past Surgical History:  Procedure Laterality Date  . CORONARY ANGIOPLASTY  ?   PT UNABLE TO TELL IF  BEFORE OR AFTER  CABG  . CORONARY ARTERY BYPASS GRAFT  2008  ([place] , [place])   3 VESSEL  . DIALYSIS FISTULA CREATION  LAST SURGERY  APPOX  2008  . PERIPHERAL ARTERIAL STENT GRAFT Left     Prior to Admission medications   Medication Sig Start Date End Date Taking? Authorizing Provider  aspirin EC 81 MG tablet Take 1 tablet (81 mg total) by mouth daily. 11/16/16  Yes Vaughan Basta, MD  atorvastatin (LIPITOR) 40 MG tablet Take 1 tablet (40 mg total)  by mouth daily. 11/16/16  Yes Vaughan Basta, MD  budesonide-formoterol [facility]) 160-4.5 MCG/ACT inhaler Inhale 2 puffs into the lungs daily. 11/16/16  Yes Vaughan Basta, MD  ciprofloxacin (CIPRO) 500 MG tablet Take 0.5 tablets (250 mg total) by mouth daily. 01/11/17  Yes Mody, Ulice Bold, MD  furosemide (LASIX) 80 MG tablet Take 1 tablet (80 mg total) by mouth daily. 11/16/16  Yes Vaughan Basta, MD  gabapentin (NEURONTIN) 300 MG capsule TAKE 1 CAPSULE BY MOUTH EVERY EVENING AND 1 EXTRA CAP ON DIALYSIS DAYS AS NEEDED FOR NERVE PAIN 08/16/16  Yes [provider]  labetalol (NORMODYNE) 100 MG tablet Take 1 tablet (100 mg total) by mouth daily. 11/16/16  Yes Vaughan Basta, MD  lactulose (CHRONULAC) 10 GM/15ML solution Take 30 mLs (20 g total) by mouth 3 (three) times daily. 01/11/17  Yes Mody, Ulice Bold, MD  Multiple Vitamins-Minerals-FA (DIALYVITE SUPREME D) 3 MG TABS Take 1 tablet (3 mg total) by mouth daily. 11/28/16  Yes Wieting, Richard, MD  nicotine (NICODERM CQ - DOSED IN MG/24 HOURS) 21 mg/24hr patch Place 1 patch (21 mg total) onto the skin daily. 01/12/17  Yes Mody, Ulice Bold, MD  pantoprazole (PROTONIX) 40 MG tablet Take 1 tablet (40 mg total) by mouth daily. 11/28/16  Yes Wieting, Richard, MD  tiotropium (SPIRIVA HANDIHALER) 18 MCG inhalation capsule Place 1 capsule (18 mcg total) into inhaler and inhale daily. 11/16/16 11/16/17 Yes Vaughan Basta, MD  albuterol (PROVENTIL HFA;VENTOLIN HFA) 108 (90 Base) MCG/ACT inhaler Inhale 4-6 puffs by mouth every 4 hours as needed for wheezing, cough, and/or shortness of breath 11/16/16   Vaughan Basta, MD  ipratropium-albuterol (DUONEB) 0.5-2.5 (3) MG/3ML SOLN Take 3 mLs by nebulization every 6 (six) hours as needed. 11/28/16   Loletha Grayer, MD  nitroGLYCERIN (NITROSTAT) 0.4 MG SL tablet Place 1 tablet (0.4 mg total) under the tongue every 5 (five) minutes as needed. 04/12/16   Wende Bushy, MD  sevelamer carbonate  (RENVELA) 800 MG tablet Take 3 tablets (2,400 mg total) by mouth 3 (three) times daily with meals. Patient not taking: Reported on 01/22/2017 11/16/16   Vaughan Basta, MD    Allergies Patient has no known allergies.  Family History  Problem Relation Age of Onset  . Colon cancer Mother   . Cancer Father   . Cancer Sister   . Kidney disease Brother     Social History Social History   Tobacco Use  . Smoking status: Current Every Day Smoker    Packs/day: 0.15    Years: 40.00    Pack years: 6.00    Types: Cigarettes  . Smokeless tobacco: Never Used  Substance Use Topics  . Alcohol use: No    Comment: pt reports quitting after learning about cirrhosis  . Drug use: No    Review of Systems  Constitutional: No fever/chills Eyes: No visual changes. ENT: No  sore throat. Cardiovascular: Denies chest pain. Respiratory: some shortness of breath. Gastrointestinal: some chronic abdominal pain.  No nausea, no vomiting.  No diarrhea.  No constipation. Genitourinary: Negative for dysuria. Musculoskeletal: Negative for back pain. Skin: Negative for rash. Neurological: Negative for headaches, focal weakness  ____________________________________________   PHYSICAL EXAM:  VITAL SIGNS: ED Triage Vitals  Enc Vitals Group     BP 01/22/17 1800 127/80     Pulse Rate 01/22/17 1800 92     Resp --      Temp --      Temp src --      SpO2 01/22/17 1800 96 %     Weight --      Height --      Head Circumference --      Peak Flow --      Pain Score 01/22/17 1747 0     Pain Loc --      Pain Edu? --      Excl. in [place]? --     Constitutional: Alert and oriented. Well appearing and in no acute distress. Eyes: Conjunctivae are normal.  Head: Atraumatic. Nose: No congestion/rhinnorhea. Mouth/Throat: Mucous membranes are moist.  Oropharynx non-erythematous. Neck: No stridor.   Cardiovascular: Normal rate, regular rhythm. Grossly normal heart sounds.  Good peripheral  circulation. Respiratory: Normal respiratory effort.  No retractions. Lungs CTAB. Gastrointestinal: Soft mildly diffusely tender somewhat distended from ascites patient reports. No abdominal bruits. No CVA tenderness. Musculoskeletal: No lower extremity tenderness trace edema.  No joint effusions. Neurologic:  Normal speech and language. No gross focal neurologic deficits are appreciated. Skin:  Skin is warm, dry and intact. No rash noted. Psychiatric: Mood and affect are normal. Speech and behavior are normal.  ____________________________________________   LABS (all labs ordered are listed, but only abnormal results are displayed)  Labs Reviewed  COMPREHENSIVE METABOLIC PANEL - Abnormal; Notable for the following components:      Result Value   Potassium 6.1 (*)    Chloride 95 (*)    BUN 47 (*)    Creatinine, Ser 9.49 (*)    Albumin 2.4 (*)    Alkaline Phosphatase 183 (*)    GFR calc non Af Amer 5 (*)    GFR calc Af Amer 6 (*)    All other components within normal limits  CBC WITH DIFFERENTIAL/PLATELET - Abnormal; Notable for the following components:   RBC 3.32 (*)    Hemoglobin 8.5 (*)    HCT 27.7 (*)    MCH 25.6 (*)    MCHC 30.7 (*)    RDW 21.8 (*)    Neutro Abs 7.3 (*)    Monocytes Absolute 1.1 (*)    All other components within normal limits  APTT  PROTIME-INR  TYPE AND SCREEN   ____________________________________________  EKG  EKG read and interpreted by me shows normal sinus rhythm rate of 95 rightward axis with flipped T waves inferiorly otherwise no acute changesE flipped T waves inferiorly are new from the third of this month but on the third of this month he had flipped T waves minimally flipped T waves laterally in the chest leads which she does not now ____________________________________________  RADIOLOGY   ____________________________________________   PROCEDURES  Procedure(s) performed:   Procedures  Critical Care performed:    ____________________________________________   INITIAL IMPRESSION / ASSESSMENT AND PLAN / ED COURSE  As part of my medical decision making, I reviewed the following data within the electronic MEDICAL RECORD NUMBER  review of  the medical records reveals the patient has acute kidney injury with his creatinine doubling in the last week  Even no his hemoglobin is not that low. Patient has had a worsening of his renal function and apparently has not been compliant with his dialysis. His creatinine is elevated his potassium is elevated I'm still waiting for his EKG to come back have discussed the patient with the hospitalist we will would admit him for a dialysis and see what happens with his creatinine and his  hemoglobin.  Clinical Course as of Jan 23 2036  Sun Jan 22, 2017  1909 CO2: 51 [PM]    Clinical Course User Index [PM] Nena Polio, MD     ____________________________________________   FINAL CLINICAL IMPRESSION(S) / ED DIAGNOSES  Final diagnoses:  AKI (acute kidney injury) [facility])  Hyperkalemia     ED Discharge Orders    None       Note:  This document was prepared using Dragon voice recognition software and may include unintentional dictation errors.    Nena Polio, MD 01/22/17 2038    Nena Polio, MD 01/22/17 [phone]

## 2017-01-22 NOTE — ED Notes (Signed)
Blood drawn and received in lab

## 2017-01-22 NOTE — ED Triage Notes (Signed)
Pt had blood draw Friday - called today and told needed blood transfusion

## 2017-01-22 NOTE — ED Notes (Signed)
Pt states he did not finish his dialysis yesterday. States he has a hard time sitting through his dialysis d/t nausea.

## 2017-01-23 DIAGNOSIS — D649 Anemia, unspecified: Secondary | ICD-10-CM | POA: Diagnosis not present

## 2017-01-23 DIAGNOSIS — I132 Hypertensive heart and chronic kidney disease with heart failure and with stage 5 chronic kidney disease, or end stage renal disease: Secondary | ICD-10-CM | POA: Diagnosis present

## 2017-01-23 DIAGNOSIS — N2581 Secondary hyperparathyroidism of renal origin: Secondary | ICD-10-CM | POA: Diagnosis present

## 2017-01-23 DIAGNOSIS — N179 Acute kidney failure, unspecified: Secondary | ICD-10-CM | POA: Diagnosis present

## 2017-01-23 DIAGNOSIS — Z992 Dependence on renal dialysis: Secondary | ICD-10-CM | POA: Diagnosis not present

## 2017-01-23 DIAGNOSIS — K219 Gastro-esophageal reflux disease without esophagitis: Secondary | ICD-10-CM | POA: Diagnosis present

## 2017-01-23 DIAGNOSIS — K7290 Hepatic failure, unspecified without coma: Secondary | ICD-10-CM | POA: Diagnosis present

## 2017-01-23 DIAGNOSIS — N186 End stage renal disease: Secondary | ICD-10-CM | POA: Diagnosis present

## 2017-01-23 DIAGNOSIS — E1151 Type 2 diabetes mellitus with diabetic peripheral angiopathy without gangrene: Secondary | ICD-10-CM | POA: Diagnosis present

## 2017-01-23 DIAGNOSIS — F17210 Nicotine dependence, cigarettes, uncomplicated: Secondary | ICD-10-CM | POA: Diagnosis present

## 2017-01-23 DIAGNOSIS — Z951 Presence of aortocoronary bypass graft: Secondary | ICD-10-CM | POA: Diagnosis not present

## 2017-01-23 DIAGNOSIS — E1122 Type 2 diabetes mellitus with diabetic chronic kidney disease: Secondary | ICD-10-CM | POA: Diagnosis present

## 2017-01-23 DIAGNOSIS — E440 Moderate protein-calorie malnutrition: Secondary | ICD-10-CM | POA: Diagnosis present

## 2017-01-23 DIAGNOSIS — E875 Hyperkalemia: Secondary | ICD-10-CM | POA: Diagnosis present

## 2017-01-23 DIAGNOSIS — Z7982 Long term (current) use of aspirin: Secondary | ICD-10-CM | POA: Diagnosis not present

## 2017-01-23 DIAGNOSIS — D631 Anemia in chronic kidney disease: Secondary | ICD-10-CM | POA: Diagnosis present

## 2017-01-23 DIAGNOSIS — E1142 Type 2 diabetes mellitus with diabetic polyneuropathy: Secondary | ICD-10-CM | POA: Diagnosis present

## 2017-01-23 DIAGNOSIS — I5033 Acute on chronic diastolic (congestive) heart failure: Secondary | ICD-10-CM | POA: Diagnosis present

## 2017-01-23 DIAGNOSIS — I10 Essential (primary) hypertension: Secondary | ICD-10-CM | POA: Diagnosis not present

## 2017-01-23 DIAGNOSIS — D62 Acute posthemorrhagic anemia: Secondary | ICD-10-CM | POA: Diagnosis present

## 2017-01-23 DIAGNOSIS — R062 Wheezing: Secondary | ICD-10-CM | POA: Diagnosis not present

## 2017-01-23 DIAGNOSIS — I2510 Atherosclerotic heart disease of native coronary artery without angina pectoris: Secondary | ICD-10-CM | POA: Diagnosis present

## 2017-01-23 DIAGNOSIS — E785 Hyperlipidemia, unspecified: Secondary | ICD-10-CM | POA: Diagnosis present

## 2017-01-23 DIAGNOSIS — R188 Other ascites: Secondary | ICD-10-CM | POA: Diagnosis present

## 2017-01-23 DIAGNOSIS — Z7951 Long term (current) use of inhaled steroids: Secondary | ICD-10-CM | POA: Diagnosis not present

## 2017-01-23 DIAGNOSIS — K921 Melena: Secondary | ICD-10-CM | POA: Diagnosis present

## 2017-01-23 DIAGNOSIS — Z9115 Patient's noncompliance with renal dialysis: Secondary | ICD-10-CM | POA: Diagnosis not present

## 2017-01-23 LAB — BASIC METABOLIC PANEL
ANION GAP: 15 (ref 5–15)
BUN: 50 mg/dL — ABNORMAL HIGH (ref 6–20)
CALCIUM: 8.6 mg/dL — AB (ref 8.9–10.3)
CO2: 33 mmol/L — AB (ref 22–32)
Chloride: 95 mmol/L — ABNORMAL LOW (ref 101–111)
Creatinine, Ser: 9.69 mg/dL — ABNORMAL HIGH (ref 0.61–1.24)
GFR calc non Af Amer: 5 mL/min — ABNORMAL LOW (ref >60)
GFR, EST AFRICAN AMERICAN: 6 mL/min — AB (ref >60)
Glucose, Bld: 83 mg/dL (ref 65–99)
POTASSIUM: 6.2 mmol/L — AB (ref 3.5–5.1)
Sodium: 143 mmol/L (ref 135–145)

## 2017-01-23 LAB — PROTIME-INR
INR: 1.06
PROTHROMBIN TIME: 13.7 s (ref 11.4–15.2)

## 2017-01-23 LAB — HEMOGLOBIN AND HEMATOCRIT, BLOOD
HCT: 23.5 % — ABNORMAL LOW (ref 40.0–52.0)
HEMATOCRIT: 27 % — AB (ref 40.0–52.0)
HEMOGLOBIN: 8.4 g/dL — AB (ref 13.0–18.0)
Hemoglobin: 7.2 g/dL — ABNORMAL LOW (ref 13.0–18.0)

## 2017-01-23 LAB — CBC
HEMATOCRIT: 26.7 % — AB (ref 40.0–52.0)
HEMOGLOBIN: 8.1 g/dL — AB (ref 13.0–18.0)
MCH: 25 pg — AB (ref 26.0–34.0)
MCHC: 30.3 g/dL — ABNORMAL LOW (ref 32.0–36.0)
MCV: 82.5 fL (ref 80.0–100.0)
Platelets: 358 10*3/uL (ref 150–440)
RBC: 3.24 MIL/uL — AB (ref 4.40–5.90)
RDW: 20.9 % — ABNORMAL HIGH (ref 11.5–14.5)
WBC: 9.4 10*3/uL (ref 3.8–10.6)

## 2017-01-23 LAB — PREPARE RBC (CROSSMATCH)

## 2017-01-23 LAB — APTT: APTT: 32 s (ref 24–36)

## 2017-01-23 LAB — POTASSIUM: Potassium: 6.3 mmol/L (ref 3.5–5.1)

## 2017-01-23 IMAGING — CT CT ABD-PELV W/ CM
2 of 6 series · 16 of 46 positions shown, 18 images · IV contrast (Omni 300)
Comparison: None.

CLINICAL DATA: Abdominal distension beginning 2-3 days ago.
Colonoscopy 3 weeks ago. End-stage renal disease.

EXAM:
CT ABDOMEN AND PELVIS WITH CONTRAST
TECHNIQUE: Multidetector CT imaging of the abdomen and pelvis was performed
using the standard protocol following bolus administration of
intravenous contrast.
CONTRAST:  100mL OMNIPAQUE IOHEXOL 300 MG/ML  SOLN

[Series 4: abd/ pelvis 1.0 i30f 1 · axial · 0.84mm/px · z∈[-941,-516]mm · 13 of 660 slices shown, 15 images]
[im 27/660  soft-tissue]
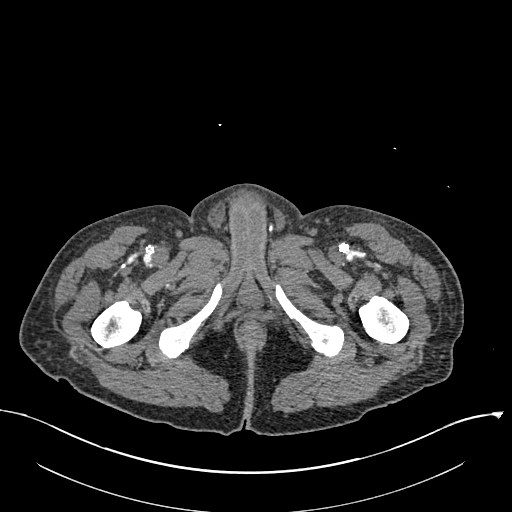
[im 27/660  bone]
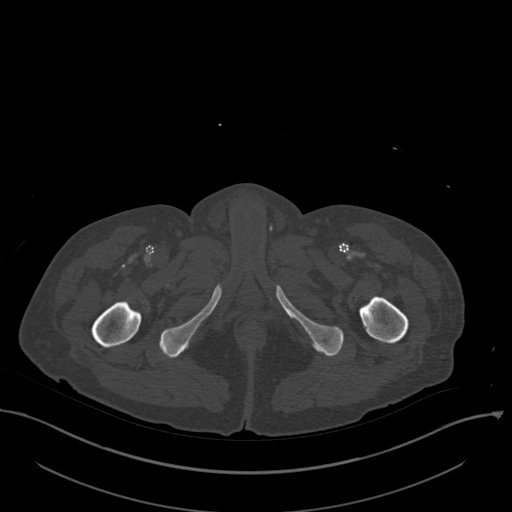
[im 80/660  soft-tissue]
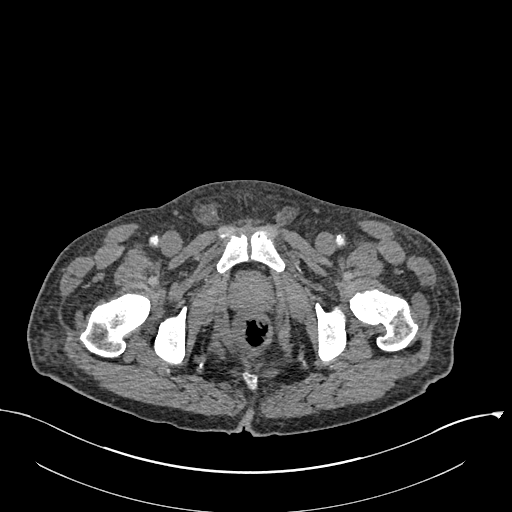
[im 132/660  soft-tissue]
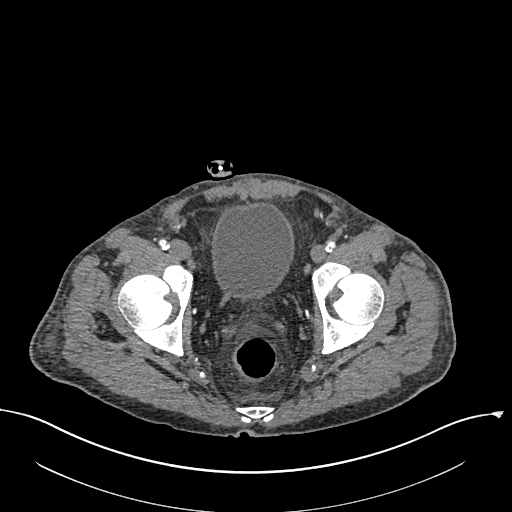
[im 185/660  soft-tissue]
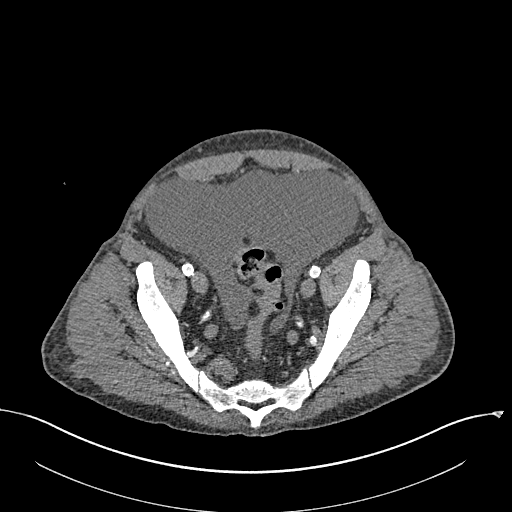
[im 238/660  soft-tissue]
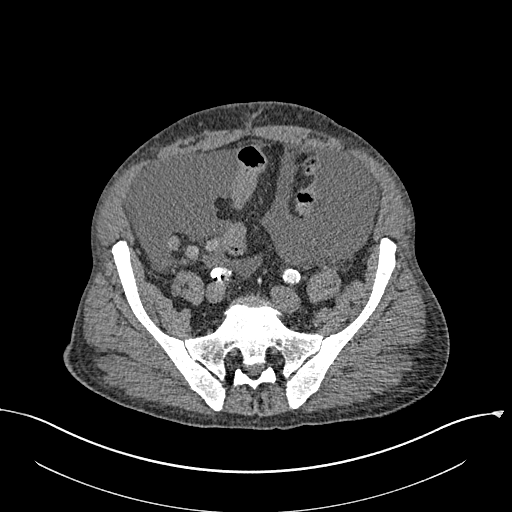
[im 290/660  soft-tissue]
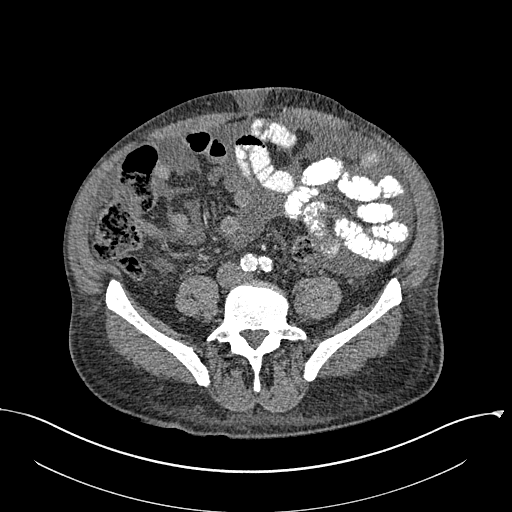
[im 343/660  soft-tissue]
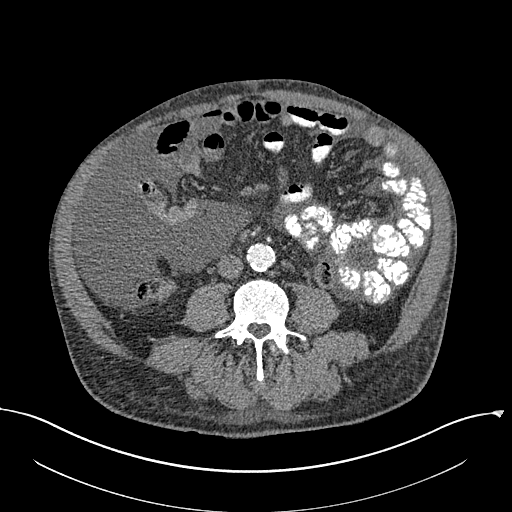
[im 370/660  soft-tissue]
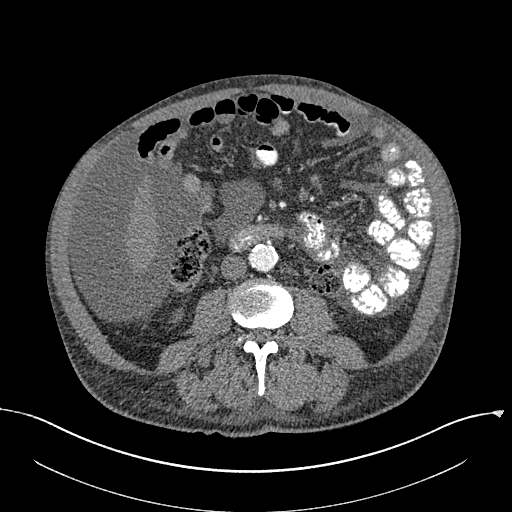
[im 422/660  soft-tissue]
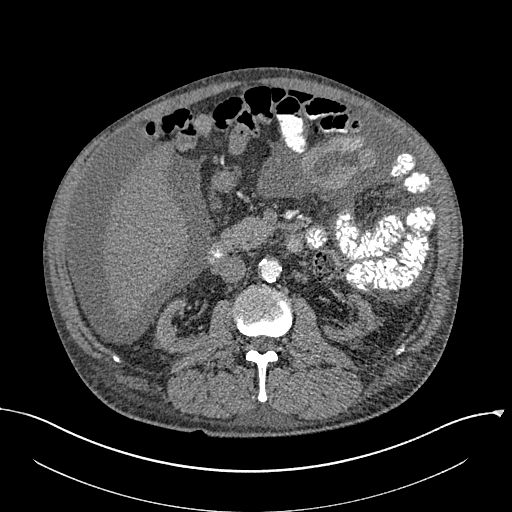
[im 422/660  bone]
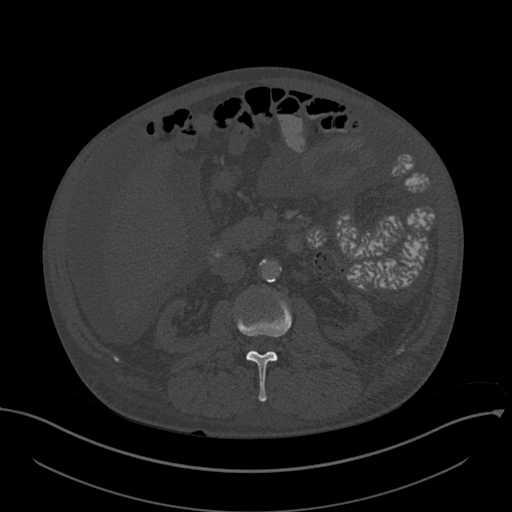
[im 475/660  soft-tissue]
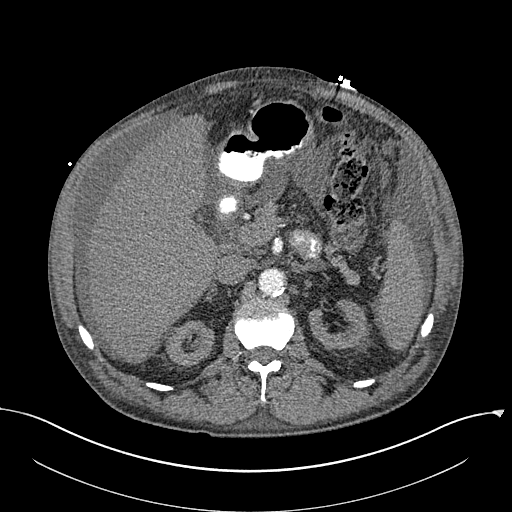
[im 528/660  soft-tissue]
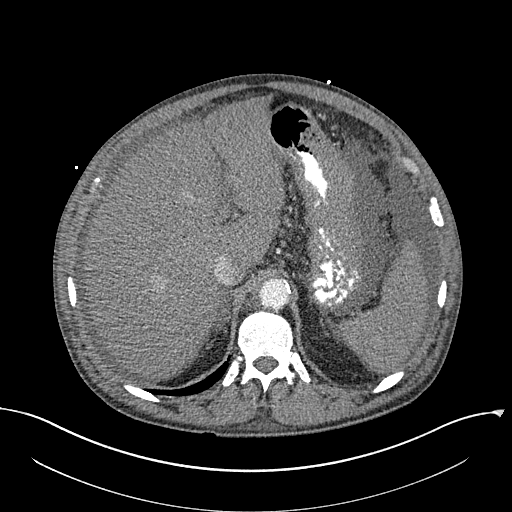
[im 580/660  soft-tissue]
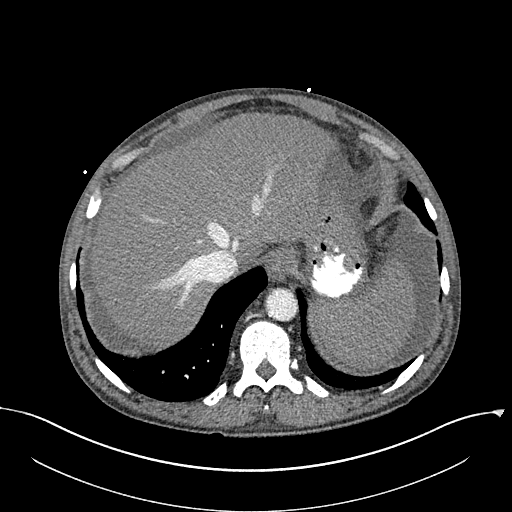
[im 633/660  soft-tissue]
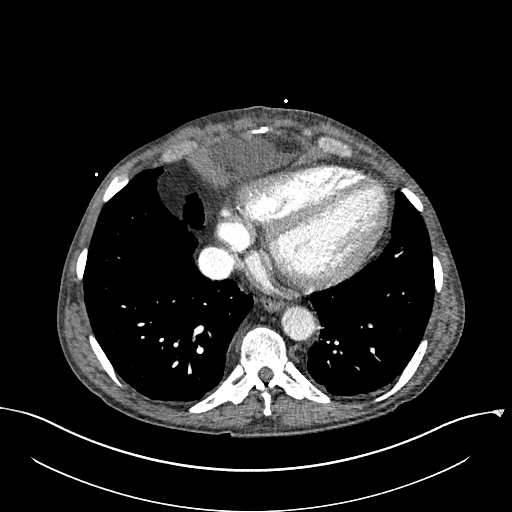

[Series 5: coronals · coronal · 0.74mm/px · 3 of 143 slices shown]
[im 48/143  soft-tissue]
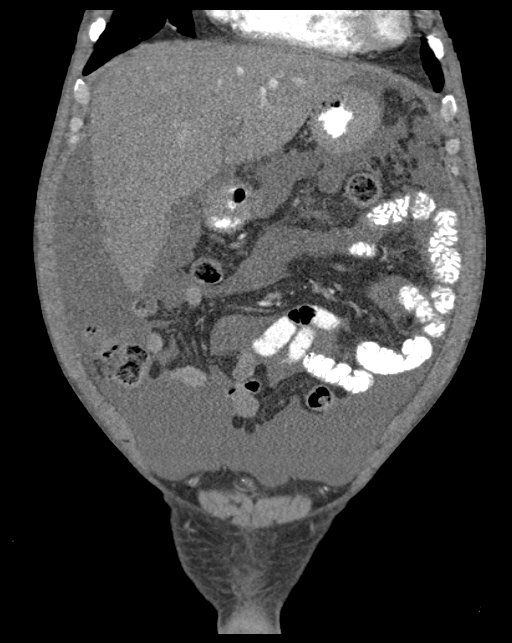
[im 64/143  soft-tissue]
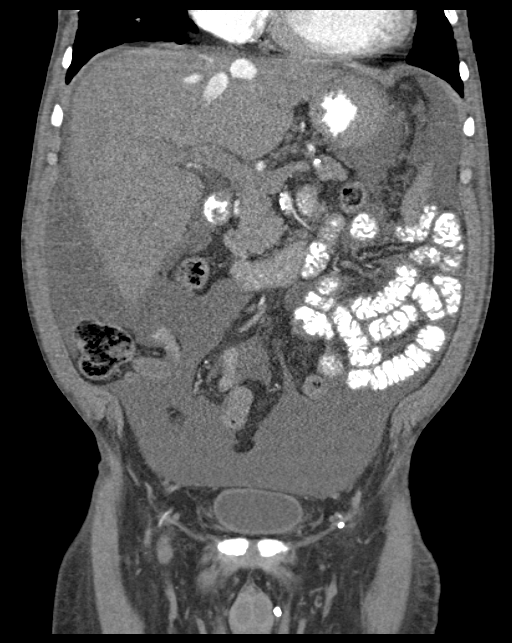
[im 79/143  soft-tissue]
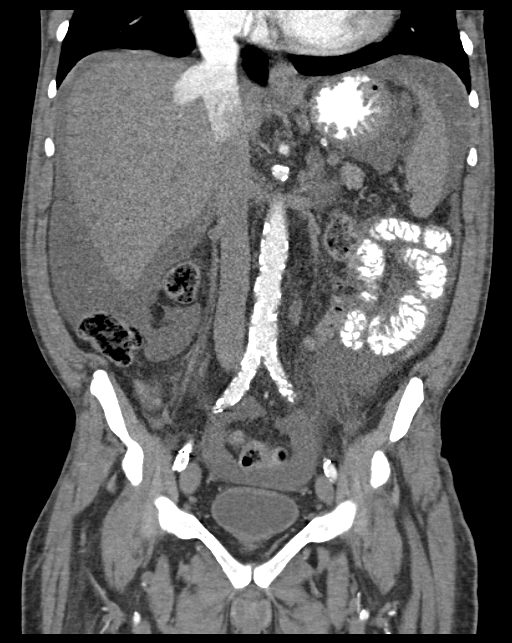

[16 of 46 positions shown; findings below may reference images not displayed]

FINDINGS: Lung bases are within normal.  There is cardiomegaly.

Abdominal images demonstrate the liver, spleen, pancreas, adrenal
glands and gallbladder to be within normal. Kidneys are somewhat
small without evidence of hydronephrosis or nephrolithiasis. There
are a few small bilateral renal cysts and a few sub cm renal
cortical hypodensities too small to characterize but likely cysts.
Ureters are normal. Appendix is normal. There is mild calcified
plaque over the abdominal aorta and iliac arteries.

There is moderate ascites. There is no free peritoneal air. There is
mild diffuse wall thickening of the greater curvature of the stomach
measuring 2 cm in thickness. There is minimal diverticulosis of the
colon. The colon is otherwise within normal. Small bowel is within
normal.

Pelvic images again demonstrate evidence of moderate ascites. The
bladder, prostate and rectum are normal. There are degenerative
changes of the hips and lumbosacral spine.
IMPRESSION: Moderate ascites.  No free air or evidence of colonic injury.

Mild diffuse wall thickening over the greater curvature of the
stomach likely due to gastritis and less likely a neoplastic
process.

Mild colonic diverticulosis.

Cardiomegaly.

Small kidneys with bilateral renal cysts.

## 2017-01-23 MED ORDER — DIPHENHYDRAMINE HCL 25 MG PO CAPS
25.0000 mg | ORAL_CAPSULE | Freq: Once | ORAL | Status: AC
  Administered 2017-01-23: 25 mg via ORAL
  Filled 2017-01-23: qty 1

## 2017-01-23 MED ORDER — SODIUM CHLORIDE 0.9 % IV SOLN: Freq: Once | INTRAVENOUS | Status: DC

## 2017-01-23 MED ORDER — EPOETIN ALFA 10000 UNIT/ML IJ SOLN
10000.0000 [IU] | Freq: Once | INTRAMUSCULAR | Status: AC
  Administered 2017-01-23: 10000 [IU] via INTRAVENOUS

## 2017-01-23 MED ORDER — FUROSEMIDE 10 MG/ML IJ SOLN: 80.0000 mg | Freq: Once | INTRAMUSCULAR | Status: DC

## 2017-01-23 MED ORDER — BUDESONIDE 0.5 MG/2ML IN SUSP
0.5000 mg | Freq: Two times a day (BID) | RESPIRATORY_TRACT | Status: DC
  Administered 2017-01-23 – 2017-01-25 (×5): 0.5 mg via RESPIRATORY_TRACT
  Filled 2017-01-23 (×5): qty 2

## 2017-01-23 MED ORDER — RENA-VITE PO TABS
1.0000 | ORAL_TABLET | Freq: Every day | ORAL | Status: DC
  Administered 2017-01-23 – 2017-01-25 (×3): 1 via ORAL
  Filled 2017-01-23 (×3): qty 1

## 2017-01-23 MED ORDER — IPRATROPIUM-ALBUTEROL 0.5-2.5 (3) MG/3ML IN SOLN
3.0000 mL | Freq: Four times a day (QID) | RESPIRATORY_TRACT | Status: DC
  Administered 2017-01-23 – 2017-01-25 (×6): 3 mL via RESPIRATORY_TRACT
  Filled 2017-01-23 (×7): qty 3

## 2017-01-23 MED ORDER — NEPRO/CARBSTEADY PO LIQD
237.0000 mL | Freq: Three times a day (TID) | ORAL | Status: DC
  Administered 2017-01-23 – 2017-01-25 (×6): 237 mL via ORAL

## 2017-01-23 NOTE — Progress Notes (Signed)
HD initiated via R AVF without issue using 15g needles x2. Patient to receive 1 unit PRBCs in HD. Benadryl administered for itching per Dr. [name]. Continue to monitor closely.

## 2017-01-23 NOTE — Progress Notes (Signed)
[facility], [place] 01/23/17  Subjective:   Patient presented to the emergency room because he was told by his primary care that his hemoglobin is low and he needed a transfusion.  Last dialysis was Saturday.  He did not finish his treatment because of heartburn/chest pain Presenting hemoglobin 8.5.  Potassium high above 6.  Therefore admitted for further treatment Hemoglobin low at 7.2  Objective:  Vital signs in last 24 hours:  Temp:  [98 F (36.7 C)-98.2 F (36.8 C)] 98 F (36.7 C) (11/12 0409) Pulse Rate:  [77-92] 77 (11/12 0409) Resp:  [12-22] 16 (11/12 0409) BP: (109-140)/(67-88) 112/67 (11/12 0409) SpO2:  [94 %-97 %] 94 % (11/12 0409) Weight:  [74.4 kg (164 lb)] 74.4 kg (164 lb) (11/11 2324)  Weight change:  Filed Weights   01/22/17 2324  Weight: 74.4 kg (164 lb)    Intake/Output:   No intake or output data in the 24 hours ending 01/23/17 0837   Physical Exam: General:  Laying in the bed   HEENT  anicteric, moist oral mucous membranes  Neck  supple  Pulm/lungs  Clear to auscultation bilateral   CVS/Heart  S1S2 no rubs  Abdomen:   Soft, mild distension noted, BS present  Extremities: 1+ peripheral edema  Neurologic:  Alert, oriented, conversant  Skin:  No acute rashes  Access:  Right upper arm AV fistula       Basic Metabolic Panel:  Recent Labs  Lab 01/22/17 1814 01/23/17 0105 01/23/17 0720  NA 140 143  --   K 6.1* 6.2* 6.3*  CL 95* 95*  --   CO2 30 33*  --   GLUCOSE 91 83  --   BUN 47* 50*  --   CREATININE 9.49* 9.69*  --   CALCIUM 8.9 8.6*  --      CBC: Recent Labs  Lab 01/22/17 1814 01/23/17 0105 01/23/17 0714  WBC 10.2 9.4  --   NEUTROABS 7.3*  --   --   HGB 8.5* 8.1* 7.2*  HCT 27.7* 26.7* 23.5*  MCV 83.3 82.5  --   PLT 379 358  --       Lab Results  Component Value Date   HEPBSAG Negative 11/26/2016   HEPBSAB Reactive 06/01/2016   HEPBIGM Negative 06/01/2016      Microbiology:  No  results found for this or any previous visit (from the past 240 hour(s)).  Coagulation Studies: Recent Labs    01/23/17 0105  LABPROT 13.7  INR 1.06    Urinalysis: No results for input(s): COLORURINE, LABSPEC, PHURINE, GLUCOSEU, HGBUR, BILIRUBINUR, KETONESUR, PROTEINUR, UROBILINOGEN, NITRITE, LEUKOCYTESUR in the last 72 hours.  Invalid input(s): APPERANCEUR    Imaging: No results found.   Medications:   . sodium chloride    . sodium chloride    . pantoprazole (PROTONIX) IV     . atorvastatin  40 mg Oral Daily  . ciprofloxacin  250 mg Oral Daily  . furosemide  80 mg Oral Daily  . labetalol  100 mg Oral Daily  . lactulose  20 g Oral TID  . mometasone-formoterol  2 puff Inhalation BID  . multivitamin  1 tablet Oral Daily  . nicotine  21 mg Transdermal Daily  . pantoprazole  40 mg Oral Daily  . pneumococcal 23 valent vaccine  0.5 mL Intramuscular Tomorrow-1000  . sevelamer carbonate  2,400 mg Oral TID WC  . sodium chloride flush  3 mL Intravenous Q12H  . sodium polystyrene  30  g Oral Once   sodium chloride, acetaminophen **OR** acetaminophen, HYDROcodone-acetaminophen, ipratropium-albuterol, nitroGLYCERIN, sodium chloride flush  Assessment/ Plan:  56 y.o. 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 End-stage renal disease. 2.  Hyperkalemia 3. Hypertension 4. Anemia of chronic kidney disease, anemia secondary to GI bleed as well.   5. Ascites secondary to end-stage liver disease and cirrhosis s/p paracentesis 10/29 6. Secondary hyperparathyroidism.  Plan: Urgent hemodialysis treatment today for hyperkalemia Patient will continue Procrit with dialysis Continue sevelamer for phosphorus  binding Consider estrogen supplementation for continuing GI bleed.  Will discuss with pharmacy Continue Procrit.  Orders placed.   LOS: 0 [facility] 11/12/20188:37 [place], [place]

## 2017-01-23 NOTE — Progress Notes (Signed)
Initial Nutrition Assessment  DOCUMENTATION CODES:   Non-severe (moderate) malnutrition in context of chronic illness  INTERVENTION:   Recommend check phosphorus   Recommend check iron/anemia profile   Recommend check vitamin D and vitamin C labs  Bowel regimen as needed  Renal MVI daily   Nepro Shake po TID, each supplement provides 425 kcal and 19 grams protein  NUTRITION DIAGNOSIS:   Moderate Malnutrition related to chronic illness as evidenced by mild fat depletions in orbital regions, chest, and buccal regions, moderate muscle depletions in arms, severe muscle depletions in BLE.  GOAL:   Patient will meet greater than or equal to 90% of their needs  MONITOR:   PO intake, Supplement acceptance, Labs, Weight trends, I & O's  REASON FOR ASSESSMENT:   Malnutrition Screening Tool    ASSESSMENT:   56 y.o. male with end stage renal disease on hemodialysis secondary to Alport's syndrome, end stage liver disease with hepatic cirrhosis and ascites, hypertension, anemia of chronic kidney disease, coronary artery disease, peripheral vascular disease, hyperlipidemia, gastrointestinal AVMs. Pt with h/o chronic melena, status post colonoscopy in June 2018 noted for NGO ectasias/polyp/few erosions/internal hemorrhoids, status post small bowel endoscopy on same date noted for a few bleeding angioid ectasias in the duodenum status post treatment/Monilial esophagitis, had routine blood work done on Friday by primary care provider, was told that his hemoglobin was 7.2 and instructed to go to [facility] system for further evaluation/care given the chronic melena, instead the patient chose to come to our emergency room for further evaluation.    RD familar with this pt from last admit. Pt reports poor appetite and oral intake for the past month. Pt reports that food just does not taste good. Pt has not been drinking any supplements. Pt reports that he has lost weight but is  unsure as to how much. It is difficult to determine if any true weight loss as pt has many weight fluctuations r/t fluid changes. Pt does appear to have lost 45lbs over the past 6 months; unsure how much is true weight loss vs fluid changes. Pt with h/o iron deficiency anemia; recommend check iron/anemia profile. Pt reports development of blisters in and around the outside of his mouth; pt at high risk for niacin, riboflavin, thiamine, B6, B12, and ascorbic acid deficiency r/t etoh abuse, cirrhosis, and hemodialysis.  Pt eating 100% of meals. RD will order Nepro. Pt with secondary hyperthyroidism; recommend check vitamin D levels.   Pt with history of GI bleeds 01/20/16-normal EGD except for gastritis 04/2015- colonoscopy showed no source of bleeding - very rapid witdrawal time of 3 mins 04/17/16 -push enteroscopy was normal  2/18- Push enteroscopy-AVM in jejunum ablated. This was revealed after a capsule study on 05/10/16 .  06/16/16- EGD normal 06/18/16- colonoscopy- poor prep but no source of bleeding seen .  06/21/16- capsule study normal  08/12/16 -colonoscopy few non bleeding AVM;s ablated  08/12/16- Enteroscopy - Few non bleeding duodenal AVMs seen and ablated.   Medications reviewed and include: ciprofloxacin, lasix, lactulose, protonix, nicotine, Renal MVI, kayexalate, hydrocodone      Labs reviewed: K 6.3(H), Cl 95(L), BUN 50(H), creat 9.69(H), Ca 8.6(L) adj. 9.88(L), alb 2.4(L) Hgb 7.2(L), Hct 23.5(L) PTH- 443- 10/12 BNP- 3470(H)- 9/15 Iron 30(L)- 4/5 B12- 568- 4/5  Nutrition-Focused physical exam completed.  Pt noted to have mild fat depletions in orbital regions, chest, and buccal regions, moderate muscle depletions in arms, severe muscle depletions in BLE, and no edema.  Diet Order:  Diet renal/carb modified with fluid restriction Diet-HS Snack? Nothing; Room service appropriate? Yes; Fluid consistency: Thin  EDUCATION NEEDS:   Education needs have been addressed  Skin: Reviewed RN  Assessment  Last BM:  PTA  Height:   Ht Readings from Last 1 Encounters:  01/22/17 6\' 3"  (1.905 m)    Weight:   Wt Readings from Last 1 Encounters:  01/23/17 160 lb 11.5 oz (72.9 kg)    Ideal Body Weight:  89 kg  BMI:  Body mass index is 20.09 kg/m.  Estimated Nutritional Needs:   Kcal:  2100-2400kcal/day   Protein:  115-131g/day   Fluid:  per MD  Koleen Distance MS, RD, LDN Pager #[phone] After Hours Pager: [phone]

## 2017-01-23 NOTE — Progress Notes (Signed)
Patient ID: Stanley [name], male   DOB: 02/01/1961, 56 y.o.   MRN: [id number]  [facility] PROGRESS NOTE  Stanley [name] EUM:[id number] DOB: 1960-04-04 DOA: 01/22/2017 PCP: Stanley Burrow, MD  HPI/Subjective: Patient feeling okay.  Seen earlier and he wanted to eat.  Stated that we cannot do anything for him here anyway.  Some abdominal discomfort.  Previous nausea vomiting but that has resolved  Objective: Vitals:   01/23/17 1349 01/23/17 1434  BP: 112/62 (!) 117/59  Pulse: 75 80  Resp: 13 18  Temp:  98.4 F (36.9 C)  SpO2:  100%    Filed Weights   01/22/17 2324 01/23/17 1000 01/23/17 1345  Weight: 74.4 kg (164 lb) 75.3 kg (166 lb 0.1 oz) 72.9 kg (160 lb 11.5 oz)    ROS: Review of Systems  Constitutional: Negative for chills and fever.  Eyes: Negative for blurred vision.  Respiratory: Negative for cough and shortness of breath.   Cardiovascular: Negative for chest pain.  Gastrointestinal: Positive for abdominal pain. Negative for constipation, diarrhea, nausea and vomiting.  Genitourinary: Negative for dysuria.  Musculoskeletal: Negative for joint pain.  Neurological: Negative for dizziness and headaches.   Exam: Physical Exam  Constitutional: He is oriented to person, place, and time.  HENT:  Nose: No mucosal edema.  Mouth/Throat: No oropharyngeal exudate or posterior oropharyngeal edema.  Eyes: Conjunctivae, EOM and lids are normal. Pupils are equal, round, and reactive to light.  Neck: No JVD present. Carotid bruit is not present. No edema present. No thyroid mass and no thyromegaly present.  Cardiovascular: S1 normal and S2 normal. Exam reveals no gallop.  No murmur heard. Pulses:      Dorsalis pedis pulses are 2+ on the right side, and 2+ on the left side.  Respiratory: No respiratory distress. He has no wheezes. He has no rhonchi. He has no rales.  GI: Soft. Bowel sounds are normal. There is tenderness in the left upper quadrant.   Musculoskeletal:       Right ankle: He exhibits swelling.       Left ankle: He exhibits swelling.  Lymphadenopathy:    He has no cervical adenopathy.  Neurological: He is alert and oriented to person, place, and time. No cranial nerve deficit.  Skin: Skin is warm. No rash noted. Nails show no clubbing.  Psychiatric: He has a normal mood and affect.      Data Reviewed: Basic Metabolic Panel: Recent Labs  Lab 01/22/17 1814 01/23/17 0105 01/23/17 0720  NA 140 143  --   K 6.1* 6.2* 6.3*  CL 95* 95*  --   CO2 30 33*  --   GLUCOSE 91 83  --   BUN 47* 50*  --   CREATININE 9.49* 9.69*  --   CALCIUM 8.9 8.6*  --    Liver Function Tests: Recent Labs  Lab 01/22/17 1814  AST 29  ALT 18  ALKPHOS 183*  BILITOT 0.5  PROT 6.7  ALBUMIN 2.4*   CBC: Recent Labs  Lab 01/22/17 1814 01/23/17 0105 01/23/17 0714 01/23/17 1504  WBC 10.2 9.4  --   --   NEUTROABS 7.3*  --   --   --   HGB 8.5* 8.1* 7.2* 8.4*  HCT 27.7* 26.7* 23.5* 27.0*  MCV 83.3 82.5  --   --   PLT 379 358  --   --    BNP (last 3 results) Recent Labs    09/15/16 1528 10/20/16 1153 11/26/16 0633  BNP  2,716.0* 3,568.0* 3,470.0*     Scheduled Meds: . atorvastatin  40 mg Oral Daily  . budesonide (PULMICORT) nebulizer solution  0.5 mg Nebulization BID  . ciprofloxacin  250 mg Oral Daily  . feeding supplement (NEPRO CARB STEADY)  237 mL Oral TID BM  . furosemide  80 mg Intravenous Once  . furosemide  80 mg Oral Daily  . ipratropium-albuterol  3 mL Nebulization Q6H  . labetalol  100 mg Oral Daily  . lactulose  20 g Oral TID  . multivitamin  1 tablet Oral Daily  . nicotine  21 mg Transdermal Daily  . pantoprazole  40 mg Oral Daily  . pneumococcal 23 valent vaccine  0.5 mL Intramuscular Tomorrow-1000  . sevelamer carbonate  2,400 mg Oral TID WC  . sodium chloride flush  3 mL Intravenous Q12H  . sodium polystyrene  30 g Oral Once   Continuous Infusions: . sodium chloride    . sodium chloride    .  sodium chloride    . pantoprazole (PROTONIX) IV      Assessment/Plan:  1. Symptomatic anemia,  acute on chronic GI bleed.  Transfuse 1 unit of packed red blood cells for hemoglobin of 7.1.  Reevaluate tomorrow.  Potentially give IV iron tomorrow.  Case discussed with gastroenterology here and they have seen him numerous times and unable to offer anything here.  He needs to follow-up at [facility].  The next time he comes to the ER can potentially transfer from the ER to [facility] ER. 2. Hyperkalemia.  Dialysis set up for today 3. Wheeze.  Start budesonide and DuoNeb nebulizer solution 4. End-stage renal disease dialysis today 5. Hyperlipidemia unspecified on atorvastatin 6. History of CAD 7. History of cirrhosis  Code Status:     Code Status Orders  (From admission, onward)        Start     Ordered   01/22/17 2338  Full code  Continuous     01/22/17 2337    Code Status History    Date Active Date Inactive Code Status Order ID Comments User Context   01/07/2017 14:45 01/13/2017 18:12 Full Code [id number]  Idelle Crouch, MD Inpatient   12/22/2016 14:31 12/24/2016 18:04 Full Code [id number]  Hillary Bow, MD ED   12/20/2016 20:20 12/21/2016 18:53 Full Code [id number]  Demetrios Loll, MD Inpatient   11/26/2016 10:30 11/28/2016 15:22 Full Code [id number]  Henreitta Leber, MD Inpatient   11/15/2016 19:30 11/16/2016 18:22 Full Code [id number]  Fritzi Mandes, MD Inpatient   10/29/2016 14:53 10/30/2016 15:06 Full Code [id number]  Loletha Grayer, MD ED   09/15/2016 17:57 09/16/2016 18:17 Full Code [id number]  Dustin Flock, MD ED   08/16/2016 09:45 08/18/2016 18:16 Full Code [id number]  Hillary Bow, MD ED   08/09/2016 16:59 08/12/2016 20:34 Full Code [id number]  Fritzi Mandes, MD Inpatient   07/03/2016 21:54 07/05/2016 21:22 Full Code [id number]  Demetrios Loll, MD Inpatient   06/24/2016 04:23 06/26/2016 15:46 Full Code [id number]  Saundra Shelling, MD Inpatient   06/14/2016 14:53 06/19/2016 21:04 Full Code [id number]  Hillary Bow, MD ED    05/31/2016 06:42 06/01/2016 21:30 Full Code [id number]  Saundra Shelling, MD Inpatient   05/05/2016 23:09 05/10/2016 21:57 Full Code [id number]  Vianne Bulls, MD Inpatient   04/16/2016 03:56 04/17/2016 18:27 Full Code [id number]  Saundra Shelling, MD Inpatient   01/19/2016 15:13 01/22/2016 14:14 Full Code [id number]  Loletha Grayer, MD ED   09/24/2015 10:33 09/25/2015 13:31 Full Code [id number]  Loletha Grayer, MD  ED   05/16/2015 14:17 05/17/2015 20:57 Full Code [id number]  Idelle Crouch, MD Inpatient   04/30/2015 20:08 05/08/2015 19:40 Full Code [id number]  Vaughan Basta, MD Inpatient   04/06/2015 05:07 04/07/2015 17:36 Full Code [id number]  Lance Coon, MD Inpatient   03/23/2015 11:07 03/27/2015 12:46 Full Code [id number]  Bettey Costa, MD Inpatient   06/19/2013 11:32 06/21/2013 17:49 Full Code [id number]  Cristal Ford, DO Inpatient   06/16/2013 17:26 06/19/2013 11:32 Full Code [id number]  Margarita Mail, PA-C ED   06/05/2013 15:33 06/06/2013 18:56 Full Code [id number]  Theodoro Kos, DO Inpatient   06/02/2013 09:01 06/05/2013 15:33 Full Code [id number]  Velvet Bathe, MD Inpatient   06/02/2013 08:40 06/02/2013 09:01 Full Code [id number]  Velvet Bathe, MD Inpatient      Disposition Plan: Potentially home tomorrow  Time spent: 25 minutes, case discussed with nephrology  [employer]arr [employer], [facility]

## 2017-01-23 NOTE — Plan of Care (Signed)
Pts potassium was 6.3 this am. Stanley [name] was notified

## 2017-01-23 NOTE — Progress Notes (Signed)
Post HD assessment unchanged  

## 2017-01-23 NOTE — Progress Notes (Signed)
Pre hd 

## 2017-01-23 NOTE — Care Management (Signed)
Amanda Morris HD liaison notified of admission.  

## 2017-01-23 NOTE — Progress Notes (Signed)
HD completed without issue. UF goal decreased due to some cramping during treatment. Patient also received epogen and 1 unit prbcs with hd. Report called to primary RN

## 2017-01-24 LAB — BASIC METABOLIC PANEL
Anion gap: 11 (ref 5–15)
BUN: 28 mg/dL — AB (ref 6–20)
CALCIUM: 7.4 mg/dL — AB (ref 8.9–10.3)
CHLORIDE: 97 mmol/L — AB (ref 101–111)
CO2: 29 mmol/L (ref 22–32)
CREATININE: 5.84 mg/dL — AB (ref 0.61–1.24)
GFR calc Af Amer: 11 mL/min — ABNORMAL LOW (ref >60)
GFR calc non Af Amer: 10 mL/min — ABNORMAL LOW (ref >60)
Glucose, Bld: 103 mg/dL — ABNORMAL HIGH (ref 65–99)
Potassium: 4.3 mmol/L (ref 3.5–5.1)
SODIUM: 137 mmol/L (ref 135–145)

## 2017-01-24 LAB — PHOSPHORUS: PHOSPHORUS: 4.6 mg/dL (ref 2.5–4.6)

## 2017-01-24 LAB — IRON AND TIBC
IRON: 42 ug/dL — AB (ref 45–182)
Saturation Ratios: 17 % — ABNORMAL LOW (ref 17.9–39.5)
TIBC: 246 ug/dL — ABNORMAL LOW (ref 250–450)
UIBC: 204 ug/dL

## 2017-01-24 LAB — FERRITIN: FERRITIN: 20 ng/mL — AB (ref 24–336)

## 2017-01-24 LAB — PREPARE RBC (CROSSMATCH)

## 2017-01-24 LAB — HEMOGLOBIN: Hemoglobin: 7.9 g/dL — ABNORMAL LOW (ref 13.0–18.0)

## 2017-01-24 MED ORDER — SIMETHICONE 80 MG PO CHEW
80.0000 mg | CHEWABLE_TABLET | Freq: Four times a day (QID) | ORAL | Status: DC
  Administered 2017-01-24 – 2017-01-25 (×3): 80 mg via ORAL
  Filled 2017-01-24 (×7): qty 1

## 2017-01-24 MED ORDER — SODIUM CHLORIDE 0.9 % IV SOLN: Freq: Once | INTRAVENOUS | Status: DC

## 2017-01-24 MED ORDER — ESTRADIOL 1 MG PO TABS
4.0000 mg | ORAL_TABLET | Freq: Three times a day (TID) | ORAL | Status: DC
  Administered 2017-01-24 – 2017-01-25 (×4): 4 mg via ORAL
  Filled 2017-01-24: qty 4
  Filled 2017-01-24: qty 2
  Filled 2017-01-24: qty 4
  Filled 2017-01-24 (×2): qty 2
  Filled 2017-01-24: qty 4
  Filled 2017-01-24: qty 2

## 2017-01-24 MED ORDER — OXYCODONE-ACETAMINOPHEN 5-325 MG PO TABS
1.0000 | ORAL_TABLET | Freq: Four times a day (QID) | ORAL | Status: DC | PRN
  Administered 2017-01-24: 2 via ORAL
  Administered 2017-01-24: 1 via ORAL
  Administered 2017-01-25 (×2): 2 via ORAL
  Filled 2017-01-24: qty 2
  Filled 2017-01-24: qty 1
  Filled 2017-01-24 (×2): qty 2

## 2017-01-24 MED ORDER — GABAPENTIN 300 MG PO CAPS
300.0000 mg | ORAL_CAPSULE | Freq: Two times a day (BID) | ORAL | Status: DC
  Administered 2017-01-24 – 2017-01-25 (×3): 300 mg via ORAL
  Filled 2017-01-24 (×3): qty 1

## 2017-01-24 MED ORDER — ACETAMINOPHEN 325 MG PO TABS
650.0000 mg | ORAL_TABLET | Freq: Once | ORAL | Status: AC
  Administered 2017-01-24: 650 mg via ORAL
  Filled 2017-01-24: qty 2

## 2017-01-24 MED ORDER — VITAMIN C 500 MG PO TABS
1000.0000 mg | ORAL_TABLET | Freq: Two times a day (BID) | ORAL | Status: DC
  Administered 2017-01-24 – 2017-01-25 (×2): 1000 mg via ORAL
  Filled 2017-01-24 (×2): qty 2

## 2017-01-24 MED ORDER — ALUM & MAG HYDROXIDE-SIMETH 200-200-20 MG/5ML PO SUSP
15.0000 mL | ORAL | Status: DC | PRN
  Administered 2017-01-24 (×2): 15 mL via ORAL
  Filled 2017-01-24 (×3): qty 30

## 2017-01-24 MED ORDER — SODIUM CHLORIDE 0.9 % IV SOLN
200.0000 mg | Freq: Once | INTRAVENOUS | Status: AC
  Administered 2017-01-24: 200 mg via INTRAVENOUS
  Filled 2017-01-24: qty 10

## 2017-01-24 NOTE — Progress Notes (Signed)
Notified Dr. Vianne Bulls that patient is still having pain 10 out of 10 2 hour after norco. Per MD discontinue norco and place order for percocet 5/325 q 6 hours PRN.

## 2017-01-24 NOTE — Progress Notes (Signed)
[place], [place] 01/24/17  Subjective:   Patient underwent dialysis yesterday.  1200 cc of fluid was removed He is requiring additional blood transfusion today therefore dialysis is requested Otherwise he is able to eat good.  Ate about 100% of his breakfast No nausea or vomiting reported  Objective:  Vital signs in last 24 hours:  Temp:  [98.2 F (36.8 C)-98.4 F (36.9 C)] 98.4 F (36.9 C) (11/13 0427) Pulse Rate:  [75-82] 82 (11/13 0427) Resp:  [12-19] 18 (11/13 0427) BP: (102-119)/(56-66) 119/66 (11/13 0427) SpO2:  [96 %-100 %] 99 % (11/13 0733) Weight:  [72.9 kg (160 lb 11.5 oz)] 72.9 kg (160 lb 11.5 oz) (11/12 1345)  Weight change: 0.91 kg (2 lb 0.1 oz) Filed Weights   01/22/17 2324 01/23/17 1000 01/23/17 1345  Weight: 74.4 kg (164 lb) 75.3 kg (166 lb 0.1 oz) 72.9 kg (160 lb 11.5 oz)    Intake/Output:    Intake/Output Summary (Last 24 hours) at 01/24/2017 1131 Last data filed at 01/24/2017 0931 Gross per 24 hour  Intake 240 ml  Output 1200 ml  Net -960 ml     Physical Exam: General:  Laying in the bed   HEENT  anicteric, moist oral mucous membranes  Neck  supple  Pulm/lungs  Clear to auscultation bilateral   CVS/Heart  S1S2 no rubs  Abdomen:   Soft, + abdominal distension noted, BS present  Extremities: 1+ peripheral edema  Neurologic:  Alert, oriented, conversant  Skin:  No acute rashes  Access:  Right upper arm AV fistula       Basic Metabolic Panel:  Recent Labs  Lab 01/22/17 1814 01/23/17 0105 01/23/17 0720 01/24/17 0453  NA 140 143  --  137  K 6.1* 6.2* 6.3* 4.3  CL 95* 95*  --  97*  CO2 30 33*  --  29  GLUCOSE 91 83  --  103*  BUN 47* 50*  --  28*  CREATININE 9.49* 9.69*  --  5.84*  CALCIUM 8.9 8.6*  --  7.4*  PHOS  --   --   --  4.6     CBC: Recent Labs  Lab 01/22/17 1814 01/23/17 0105 01/23/17 0714 01/23/17 1504 01/24/17 0453  WBC 10.2 9.4  --   --   --   NEUTROABS 7.3*  --   --   --   --    HGB 8.5* 8.1* 7.2* 8.4* 7.9*  HCT 27.7* 26.7* 23.5* 27.0*  --   MCV 83.3 82.5  --   --   --   PLT 379 358  --   --   --       Lab Results  Component Value Date   HEPBSAG Negative 11/26/2016   HEPBSAB Reactive 06/01/2016   HEPBIGM Negative 06/01/2016      Microbiology:  No results found for this or any previous visit (from the past 240 hour(s)).  Coagulation Studies: Recent Labs    01/23/17 0105  LABPROT 13.7  INR 1.06    Urinalysis: No results for input(s): COLORURINE, LABSPEC, PHURINE, GLUCOSEU, HGBUR, BILIRUBINUR, KETONESUR, PROTEINUR, UROBILINOGEN, NITRITE, LEUKOCYTESUR in the last 72 hours.  Invalid input(s): APPERANCEUR    Imaging: No results found.   Medications:   . sodium chloride    . sodium chloride    . pantoprazole (PROTONIX) IV     . acetaminophen  650 mg Oral Once  . atorvastatin  40 mg Oral Daily  . budesonide (PULMICORT) nebulizer solution  0.5 mg Nebulization BID  . ciprofloxacin  250 mg Oral Daily  . estradiol  4 mg Oral TID  . feeding supplement (NEPRO CARB STEADY)  237 mL Oral TID BM  . furosemide  80 mg Intravenous Once  . furosemide  80 mg Oral Daily  . gabapentin  300 mg Oral BID  . ipratropium-albuterol  3 mL Nebulization Q6H  . labetalol  100 mg Oral Daily  . lactulose  20 g Oral TID  . multivitamin  1 tablet Oral Daily  . nicotine  21 mg Transdermal Daily  . pantoprazole  40 mg Oral Daily  . pneumococcal 23 valent vaccine  0.5 mL Intramuscular Tomorrow-1000  . sevelamer carbonate  2,400 mg Oral TID WC  . simethicone  80 mg Oral QID  . sodium chloride flush  3 mL Intravenous Q12H  . sodium polystyrene  30 g Oral Once   acetaminophen **OR** acetaminophen, HYDROcodone-acetaminophen, nitroGLYCERIN  Assessment/ Plan:  56 y.o. male with end stage renal disease on hemodialysis secondary to Alport's syndrome, end stage liver disease with hepatic cirrhosis and ascites, hypertension, anemia of chronic kidney disease, coronary  artery disease, peripheral vascular disease, hyperlipidemia, gastrointestinal AVMs  [facility] [place] AVF  1. End-stage renal disease. 2. Hyperkalemia 3. Hypertension 4. Anemia of chronic kidney disease, anemia secondary to GI bleed as well.   5. Ascites secondary to end-stage liver disease and cirrhosis s/p paracentesis 10/29 6. Secondary hyperparathyroidism.  Plan: Hemodialysis today as patient is requiring blood transfusion.  It is also his normal day today. Patient will continue Procrit with dialysis Continue sevelamer for phosphorus binding Start estrogen (estradiol) supplementation for continuing GI bleed.  4 mg p.o. 3 times daily Abdominal distention appears to have increased today.  Increase UF goal 2-2.5 kg.   LOS: 1 Shavonn Convey 11/13/201811:31 [place], [place]

## 2017-01-24 NOTE — Progress Notes (Signed)
HD initiated via R AVF without issue. Patient to receive 1 unit prbcs with treatment. No current complaints

## 2017-01-24 NOTE — Progress Notes (Signed)
Per Dr. Leslye Peer hold labetalol and lasix as pt is back from dialysis with BP of 124/63 and pulse of 82

## 2017-01-24 NOTE — Progress Notes (Addendum)
Notified Dr. Leslye Peer that patient was requesting IV to be removed as it was hurting. Spoke with dialysis RN as he is to be given blood during dialysis. Acetaminophen pre blood medication administered at this time.

## 2017-01-24 NOTE — Progress Notes (Signed)
Patient ID: Stanley [name], male   DOB: 11-Mar-1961, 56 y.o.   MRN: [id number]  [facility] PROGRESS NOTE  Stanley [name] XEN:[id number] DOB: July 09, 1960 DOA: 01/22/2017 PCP: [name], MD  HPI/Subjective: Patient complains of left foot pain.  Tingling in the right upper thigh and pain.  Objective: Vitals:   01/23/17 2031 01/24/17 0427  BP: 117/63 119/66  Pulse: 80 82  Resp: 19 18  Temp: 98.2 F (36.8 C) 98.4 F (36.9 C)  SpO2: 100% 99%    Filed Weights   01/22/17 2324 01/23/17 1000 01/23/17 1345  Weight: 74.4 kg (164 lb) 75.3 kg (166 lb 0.1 oz) 72.9 kg (160 lb 11.5 oz)    ROS: Review of Systems  Constitutional: Negative for chills and fever.  Eyes: Negative for blurred vision.  Respiratory: Negative for cough and shortness of breath.   Cardiovascular: Negative for chest pain.  Gastrointestinal: Positive for abdominal pain. Negative for constipation, diarrhea, nausea and vomiting.  Genitourinary: Negative for dysuria.  Musculoskeletal: Negative for joint pain.  Neurological: Positive for tingling. Negative for dizziness and headaches.   Exam: Physical Exam  Constitutional: He is oriented to person, place, and time.  HENT:  Nose: No mucosal edema.  Mouth/Throat: No oropharyngeal exudate or posterior oropharyngeal edema.  Eyes: Conjunctivae, EOM and lids are normal. Pupils are equal, round, and reactive to light.  Neck: No JVD present. Carotid bruit is not present. No edema present. No thyroid mass and no thyromegaly present.  Cardiovascular: S1 normal and S2 normal. Exam reveals no gallop.  No murmur heard. Pulses:      Dorsalis pedis pulses are 2+ on the right side, and 2+ on the left side.  Respiratory: No respiratory distress. He has no wheezes. He has no rhonchi. He has no rales.  GI: Soft. Bowel sounds are normal. There is no tenderness.  Musculoskeletal:       Right ankle: He exhibits swelling.       Left ankle: He exhibits swelling.   Lymphadenopathy:    He has no cervical adenopathy.  Neurological: He is alert and oriented to person, place, and time. No cranial nerve deficit.  Skin: Skin is warm. No rash noted. Nails show no clubbing.  Psychiatric: He has a normal mood and affect.      Data Reviewed: Basic Metabolic Panel: Recent Labs  Lab 01/22/17 1814 01/23/17 0105 01/23/17 0720 01/24/17 0453  NA 140 143  --  137  K 6.1* 6.2* 6.3* 4.3  CL 95* 95*  --  97*  CO2 30 33*  --  29  GLUCOSE 91 83  --  103*  BUN 47* 50*  --  28*  CREATININE 9.49* 9.69*  --  5.84*  CALCIUM 8.9 8.6*  --  7.4*  PHOS  --   --   --  4.6   Liver Function Tests: Recent Labs  Lab 01/22/17 1814  AST 29  ALT 18  ALKPHOS 183*  BILITOT 0.5  PROT 6.7  ALBUMIN 2.4*   CBC: Recent Labs  Lab 01/22/17 1814 01/23/17 0105 01/23/17 0714 01/23/17 1504 01/24/17 0453  WBC 10.2 9.4  --   --   --   NEUTROABS 7.3*  --   --   --   --   HGB 8.5* 8.1* 7.2* 8.4* 7.9*  HCT 27.7* 26.7* 23.5* 27.0*  --   MCV 83.3 82.5  --   --   --   PLT 379 358  --   --   --  BNP (last 3 results) Recent Labs    09/15/16 1528 10/20/16 1153 11/26/16 0633  BNP 2,716.0* 3,568.0* 3,470.0*     Scheduled Meds: . acetaminophen  650 mg Oral Once  . atorvastatin  40 mg Oral Daily  . budesonide (PULMICORT) nebulizer solution  0.5 mg Nebulization BID  . ciprofloxacin  250 mg Oral Daily  . feeding supplement (NEPRO CARB STEADY)  237 mL Oral TID BM  . furosemide  80 mg Intravenous Once  . furosemide  80 mg Oral Daily  . ipratropium-albuterol  3 mL Nebulization Q6H  . labetalol  100 mg Oral Daily  . lactulose  20 g Oral TID  . multivitamin  1 tablet Oral Daily  . nicotine  21 mg Transdermal Daily  . pantoprazole  40 mg Oral Daily  . pneumococcal 23 valent vaccine  0.5 mL Intramuscular Tomorrow-1000  . sevelamer carbonate  2,400 mg Oral TID WC  . sodium chloride flush  3 mL Intravenous Q12H  . sodium polystyrene  30 g Oral Once   Continuous  Infusions: . sodium chloride    . sodium chloride    . iron sucrose    . pantoprazole (PROTONIX) IV      Assessment/Plan:  1. Symptomatic anemia,  acute on chronic GI bleed.  Transfuse another 1 unit of packed red blood cells for hemoglobin of 7.9.  Reevaluate tomorrow.  Potentially give IV iron today.  GI has seen him numerous times in the past and recommended him follow-up at [facility].  Vitamin C level. 2. Hyperkalemia.  Improved with dialysis yesterday 3. Wheeze improved with nebulizers.   continue budesonide and DuoNeb nebulizer solution 4. End-stage renal disease dialysis again today 5. Hyperlipidemia unspecified on atorvastatin 6. History of CAD 7. History of cirrhosis 8. Numbness right leg likely femoral lateral cutaneous nerve issue.  Start low-dose gabapentin  Code Status:     Code Status Orders  (From admission, onward)        Start     Ordered   01/22/17 2338  Full code  Continuous     01/22/17 2337    Code Status History    Date Active Date Inactive Code Status Order ID Comments User Context   01/07/2017 14:45 01/13/2017 18:12 Full Code [id number]  Idelle Crouch, MD Inpatient   12/22/2016 14:31 12/24/2016 18:04 Full Code [id number]  Hillary Bow, MD ED   12/20/2016 20:20 12/21/2016 18:53 Full Code [id number]  Demetrios Loll, MD Inpatient   11/26/2016 10:30 11/28/2016 15:22 Full Code [id number]  Henreitta Leber, MD Inpatient   11/15/2016 19:30 11/16/2016 18:22 Full Code [id number]  Fritzi Mandes, MD Inpatient   10/29/2016 14:53 10/30/2016 15:06 Full Code [id number]  Loletha Grayer, MD ED   09/15/2016 17:57 09/16/2016 18:17 Full Code [id number]  Dustin Flock, MD ED   08/16/2016 09:45 08/18/2016 18:16 Full Code [id number]  Hillary Bow, MD ED   08/09/2016 16:59 08/12/2016 20:34 Full Code [id number]  Fritzi Mandes, MD Inpatient   07/03/2016 21:54 07/05/2016 21:22 Full Code [id number]  Demetrios Loll, MD Inpatient   06/24/2016 04:23 06/26/2016 15:46 Full Code [id number]  Saundra Shelling, MD Inpatient    06/14/2016 14:53 06/19/2016 21:04 Full Code [id number]  Hillary Bow, MD ED   05/31/2016 06:42 06/01/2016 21:30 Full Code [id number]  Saundra Shelling, MD Inpatient   05/05/2016 23:09 05/10/2016 21:57 Full Code [id number]  Vianne Bulls, MD Inpatient   04/16/2016 03:56 04/17/2016 18:27 Full Code [id number]  Saundra Shelling, MD Inpatient   01/19/2016 15:13 01/22/2016 14:14 Full  Code [id number]  Loletha Grayer, MD ED   09/24/2015 10:33 09/25/2015 13:31 Full Code [id number]  Loletha Grayer, MD ED   05/16/2015 14:17 05/17/2015 20:57 Full Code [id number]  Idelle Crouch, MD Inpatient   04/30/2015 20:08 05/08/2015 19:40 Full Code [id number]  Vaughan Basta, MD Inpatient   04/06/2015 05:07 04/07/2015 17:36 Full Code [id number]  Lance Coon, MD Inpatient   03/23/2015 11:07 03/27/2015 12:46 Full Code [id number]  Bettey Costa, MD Inpatient   06/19/2013 11:32 06/21/2013 17:49 Full Code [id number]  Cristal Ford, DO Inpatient   06/16/2013 17:26 06/19/2013 11:32 Full Code [id number]  Margarita Mail, PA-C ED   06/05/2013 15:33 06/06/2013 18:56 Full Code [id number]  Theodoro Kos, DO Inpatient   06/02/2013 09:01 06/05/2013 15:33 Full Code [id number]  Velvet Bathe, MD Inpatient   06/02/2013 08:40 06/02/2013 09:01 Full Code [id number]  Velvet Bathe, MD Inpatient      Disposition Plan: Hemoglobin needs to be better prior to disposition somewhat events chronically losing blood.  Time spent: 25 minutes.  Loletha Grayer  [employer]

## 2017-01-24 NOTE — Progress Notes (Signed)
Patient refused telemetry monitoring.

## 2017-01-24 NOTE — Progress Notes (Signed)
Pre HD  

## 2017-01-24 NOTE — Progress Notes (Signed)
Post HD assessment unchanged  

## 2017-01-24 NOTE — Progress Notes (Signed)
Pre hd 

## 2017-01-24 NOTE — Progress Notes (Signed)
HD completed without issue. Patient received 1 unit prbcs on HD. No current complaints.

## 2017-01-25 LAB — FOLATE RBC
FOLATE, RBC: 2338 ng/mL (ref >498)
Folate, Hemolysate: 596.2 ng/mL
HEMATOCRIT: 25.5 % — AB (ref 37.5–51.0)

## 2017-01-25 LAB — TYPE AND SCREEN
ABO/RH(D): A POS
ANTIBODY SCREEN: NEGATIVE
UNIT DIVISION: 0
Unit division: 0
Unit division: 0
Unit division: 0
Unit division: 0

## 2017-01-25 LAB — CBC
HCT: 29.4 % — ABNORMAL LOW (ref 40.0–52.0)
Hemoglobin: 9.4 g/dL — ABNORMAL LOW (ref 13.0–18.0)
MCH: 26.9 pg (ref 26.0–34.0)
MCHC: 31.9 g/dL — AB (ref 32.0–36.0)
MCV: 84.5 fL (ref 80.0–100.0)
PLATELETS: 259 10*3/uL (ref 150–440)
RBC: 3.47 MIL/uL — ABNORMAL LOW (ref 4.40–5.90)
RDW: 19.9 % — AB (ref 11.5–14.5)
WBC: 10.1 10*3/uL (ref 3.8–10.6)

## 2017-01-25 LAB — BPAM RBC
BLOOD PRODUCT EXPIRATION DATE: 201811142359
BLOOD PRODUCT EXPIRATION DATE: 201812032359
BLOOD PRODUCT EXPIRATION DATE: 201812052359
Blood Product Expiration Date: 201812052359
Blood Product Expiration Date: 201812062359
ISSUE DATE / TIME: 201811121104
ISSUE DATE / TIME: 201811121628
ISSUE DATE / TIME: 201811131238
UNIT TYPE AND RH: 6200
UNIT TYPE AND RH: 6200
Unit Type and Rh: 6200
Unit Type and Rh: 6200
Unit Type and Rh: 9500

## 2017-01-25 LAB — HAPTOGLOBIN: HAPTOGLOBIN: 243 mg/dL — AB (ref 34–200)

## 2017-01-25 LAB — VITAMIN D 25 HYDROXY (VIT D DEFICIENCY, FRACTURES): VIT D 25 HYDROXY: 23.4 ng/mL — AB (ref 30.0–100.0)

## 2017-01-25 MED ORDER — FUROSEMIDE 80 MG PO TABS: 80.0000 mg | ORAL_TABLET | Freq: Every day | ORAL | 1 refills | Status: DC

## 2017-01-25 MED ORDER — ESTRADIOL 2 MG PO TABS: 4.0000 mg | ORAL_TABLET | Freq: Three times a day (TID) | ORAL | 0 refills | Status: DC

## 2017-01-25 MED ORDER — ALUM & MAG HYDROXIDE-SIMETH 200-200-20 MG/5ML PO SUSP: 15.0000 mL | ORAL | 0 refills | Status: DC | PRN

## 2017-01-25 MED ORDER — ESTRADIOL 0.05 MG/24HR TD PTWK
0.0500 mg | MEDICATED_PATCH | Freq: Once | TRANSDERMAL | Status: DC
  Administered 2017-01-25: 0.05 mg via TRANSDERMAL
  Filled 2017-01-25: qty 1

## 2017-01-25 NOTE — Care Management (Signed)
Elvera Bicker HD liaison notified of discharge.

## 2017-01-25 NOTE — Discharge Instructions (Signed)
Heart healthy, ADA and Renal diet. Follow up [facility].

## 2017-01-25 NOTE — Plan of Care (Signed)
Patient continues to need reinforcement regarding compliance with orders and plan of care.

## 2017-01-25 NOTE — Discharge Summary (Signed)
[facility] [place] NAME: Stanley [name]    MR#:  [id number]  DATE OF BIRTH:  1960/12/30  DATE OF ADMISSION:  01/22/2017   ADMITTING PHYSICIAN: Gorden Harms, MD  DATE OF DISCHARGE: 01/25/2017 12:22 PM  PRIMARY CARE PHYSICIAN: Alene Mires Elyse Jarvis, MD   ADMISSION DIAGNOSIS:  Hyperkalemia [E87.5] AKI (acute kidney injury) ([place]) [N17.9] DISCHARGE DIAGNOSIS:  Active Problems:   Anemia  SECONDARY DIAGNOSIS:   Past Medical History:  Diagnosis Date  . Alcohol abuse   . CHF (congestive heart failure) ([place])   . Cirrhosis ([place])   . Coronary artery disease 2009  . Diabetic peripheral neuropathy ([place])   . Drug abuse ([place])   . End stage renal disease on dialysis [facility]) NEPHROLOGIST-   [facility]  IN    HEMODIALYSIS --   TUES/  THURS/  SAT  . GERD (gastroesophageal reflux disease)   . Hyperlipidemia   . Hypertension   . PAD (peripheral artery disease) ([place])   . Renal insufficiency    Per pt, 32 oz fluid restriction per day  . S/P triple vessel bypass 06/09/2016   2009ish  . Suicidal ideation    & HOMICIDAL IDEATION --  06-16-2013   ADMITTED TO [facility] COURSE:   1. Symptomatic anemia,  acute on chronic GI bleed.  Transfused another 1 unit of packed red blood cells for hemoglobin of 9.4.  GI has seen him numerous times in the past and recommended him follow-up at [facility].  Vitamin C level. 2. Hyperkalemia.  Improved with dialysis yesterday 3. Wheeze improved with nebulizers.   continue budesonide and DuoNeb nebulizer solution 4. End-stage renal disease dialysis again today 5. Hyperlipidemia unspecified on atorvastatin 6. History of CAD 7. History of cirrhosis 8. Numbness right leg likely femoral lateral cutaneous nerve issue.  Started low-dose gabapentin  DISCHARGE CONDITIONS:  Stable, discharged to home today. CONSULTS OBTAINED:  Treatment Team:  Murlean Iba, MD DRUG ALLERGIES:  No Known  Allergies DISCHARGE MEDICATIONS:   Allergies as of 01/25/2017   No Known Allergies     Medication List    STOP taking these medications   aspirin EC 81 MG tablet     TAKE these medications   albuterol 108 (90 Base) MCG/ACT inhaler Commonly known as:  PROVENTIL HFA;VENTOLIN HFA Inhale 4-6 puffs by mouth every 4 hours as needed for wheezing, cough, and/or shortness of breath   alum & mag hydroxide-simeth 200-200-20 MG/5ML suspension Commonly known as:  MAALOX/MYLANTA Take 15 mLs every 4 (four) hours as needed by mouth for indigestion or heartburn.   atorvastatin 40 MG tablet Commonly known as:  LIPITOR Take 1 tablet (40 mg total) by mouth daily.   budesonide-formoterol 160-4.5 MCG/ACT inhaler Commonly known as:  SYMBICORT Inhale 2 puffs into the lungs daily.   ciprofloxacin 500 MG tablet Commonly known as:  CIPRO Take 0.5 tablets (250 mg total) by mouth daily.   DIALYVITE SUPREME D 3 MG Tabs Take 1 tablet (3 mg total) by mouth daily.   furosemide 80 MG tablet Commonly known as:  LASIX Take 1 tablet (80 mg total) daily by mouth.   gabapentin 300 MG capsule Commonly known as:  NEURONTIN TAKE 1 CAPSULE BY MOUTH EVERY EVENING AND 1 EXTRA CAP ON DIALYSIS DAYS AS NEEDED FOR NERVE PAIN   ipratropium-albuterol 0.5-2.5 (3) MG/3ML Soln Commonly known as:  DUONEB Take 3 mLs by nebulization every 6 (six) hours as needed.   labetalol  100 MG tablet Commonly known as:  NORMODYNE Take 1 tablet (100 mg total) by mouth daily.   lactulose 10 GM/15ML solution Commonly known as:  CHRONULAC Take 30 mLs (20 g total) by mouth 3 (three) times daily.   nicotine 21 mg/24hr patch Commonly known as:  NICODERM CQ - dosed in mg/24 hours Place 1 patch (21 mg total) onto the skin daily.   nitroGLYCERIN 0.4 MG SL tablet Commonly known as:  NITROSTAT Place 1 tablet (0.4 mg total) under the tongue every 5 (five) minutes as needed.   pantoprazole 40 MG tablet Commonly known as:   PROTONIX Take 1 tablet (40 mg total) by mouth daily.   sevelamer carbonate 800 MG tablet Commonly known as:  RENVELA Take 3 tablets (2,400 mg total) by mouth 3 (three) times daily with meals.   tiotropium 18 MCG inhalation capsule Commonly known as:  SPIRIVA HANDIHALER Place 1 capsule (18 mcg total) into inhaler and inhale daily.        DISCHARGE INSTRUCTIONS:  See AVS.  If you experience worsening of your admission symptoms, develop shortness of breath, life threatening emergency, suicidal or homicidal thoughts you must seek medical attention immediately by calling 911 or calling your MD immediately  if symptoms less severe.  You Must read complete instructions/literature along with all the possible adverse reactions/side effects for all the Medicines you take and that have been prescribed to you. Take any new Medicines after you have completely understood and accpet all the possible adverse reactions/side effects.   Please note  You were cared for by a hospitalist during your hospital stay. If you have any questions about your discharge medications or the care you received while you were in the hospital after you are discharged, you can call the unit and asked to speak with the hospitalist on call if the hospitalist that took care of you is not available. Once you are discharged, your primary care physician will handle any further medical issues. Please note that NO REFILLS for any discharge medications will be authorized once you are discharged, as it is imperative that you return to your primary care physician (or establish a relationship with a primary care physician if you do not have one) for your aftercare needs so that they can reassess your need for medications and monitor your lab values.    On the day of Discharge:  VITAL SIGNS:  Blood pressure 125/62, pulse 85, temperature 98.1 F (36.7 C), temperature source Oral, resp. rate 17, height 6\' 3"  (1.905 m), weight 156 lb 12.8  oz (71.1 kg), SpO2 98 %. PHYSICAL EXAMINATION:  GENERAL:  56 y.o.-year-old patient lying in the bed with no acute distress.  EYES: Pupils equal, round, reactive to light and accommodation. No scleral icterus. Extraocular muscles intact.  HEENT: Head atraumatic, normocephalic. Oropharynx and nasopharynx clear.  NECK:  Supple, no jugular venous distention. No thyroid enlargement, no tenderness.  LUNGS: Normal breath sounds bilaterally, no wheezing, rales,rhonchi or crepitation. No use of accessory muscles of respiration.  CARDIOVASCULAR: S1, S2 normal. No murmurs, rubs, or gallops.  ABDOMEN: Soft, non-tender, mild distended. Bowel sounds present. No organomegaly or mass.  EXTREMITIES: No pedal edema, cyanosis, or clubbing.  NEUROLOGIC: Cranial nerves II through XII are intact. Muscle strength 5/5 in all extremities. Sensation intact. Gait not checked.  PSYCHIATRIC: The patient is alert and oriented x 3.  SKIN: No obvious rash, lesion, or ulcer.  DATA REVIEW:   CBC Recent Labs  Lab 01/25/17 0459  WBC 10.1  HGB 9.4*  HCT 29.4*  PLT 259    Chemistries  Recent Labs  Lab 01/22/17 1814  01/24/17 0453  NA 140   < > 137  K 6.1*   < > 4.3  CL 95*   < > 97*  CO2 30   < > 29  GLUCOSE 91   < > 103*  BUN 47*   < > 28*  CREATININE 9.49*   < > 5.84*  CALCIUM 8.9   < > 7.4*  AST 29  --   --   ALT 18  --   --   ALKPHOS 183*  --   --   BILITOT 0.5  --   --    < > = values in this interval not displayed.     Microbiology Results  Results for orders placed or performed during the hospital encounter of 01/07/17  MRSA PCR Screening     Status: None   Collection Time: 01/07/17  3:20 PM  Result Value Ref Range Status   MRSA by PCR NEGATIVE NEGATIVE Final    Comment:        The GeneXpert MRSA Assay (FDA approved for NASAL specimens only), is one component of a comprehensive MRSA colonization surveillance program. It is not intended to diagnose MRSA infection nor to guide or monitor  treatment for MRSA infections.   Body fluid culture     Status: None   Collection Time: 01/09/17 11:10 AM  Result Value Ref Range Status   Specimen Description PERITONEAL  Final   Special Requests NONE  Final   Gram Stain   Final    FEW WBC PRESENT, PREDOMINANTLY MONONUCLEAR NO ORGANISMS SEEN    Culture   Final    NO GROWTH 3 DAYS Performed at [facility], [address], [place], [place] [address]    Report Status 01/13/2017 FINAL  Final    RADIOLOGY:  No results found.   Management plans discussed with the patient, family and they are in agreement.  CODE STATUS: Prior   TOTAL TIME TAKING CARE OF THIS PATIENT: 32 minutes.    Demetrios Loll M.D on 01/25/2017 at 6:34 PM  Between 7am to 6pm - Pager - [phone]  After 6pm go to [email or web address] - Proofreader  [facility]  Office  [phone]  CC: Primary care physician; [name], MD   Note: This dictation was prepared with Dragon dictation along with smaller phrase technology. Any transcriptional errors that result from this process are unintentional.

## 2017-01-25 NOTE — Care Management Important Message (Signed)
Important Message  Patient Details  Name: Stanley [name] MRN: [id number] Date of Birth: [birth year]   Medicare Important Message Given:  Yes    Beverly Sessions, RN 01/25/2017, 11:14 AM

## 2017-01-26 ENCOUNTER — Encounter: Payer: Self-pay | Admitting: Dietician

## 2017-01-26 LAB — MISC LABCORP TEST (SEND OUT): LABCORP TEST CODE: 1479

## 2017-01-26 NOTE — Progress Notes (Unsigned)
Brief Nutrition Note  Pt ascorbic acid lab returned today; lab significant for scurvy. Notified MD [name]. Recommend Vitamin C 500mg  BID x 10 days, then 500mg  PO daily. Also noted Vitamin D, 25-Hydroxy lab 23.4(L); recommend weekly IV supplementation with dialysis.   Koleen Distance MS, RD, LDN Pager #- [phone] After Hours Pager: [phone]

## 2017-01-28 DIAGNOSIS — N186 End stage renal disease: Secondary | ICD-10-CM | POA: Diagnosis not present

## 2017-01-28 DIAGNOSIS — D509 Iron deficiency anemia, unspecified: Secondary | ICD-10-CM | POA: Diagnosis not present

## 2017-01-28 DIAGNOSIS — D649 Anemia, unspecified: Secondary | ICD-10-CM | POA: Diagnosis not present

## 2017-01-28 DIAGNOSIS — N2581 Secondary hyperparathyroidism of renal origin: Secondary | ICD-10-CM | POA: Diagnosis not present

## 2017-01-30 DIAGNOSIS — N2581 Secondary hyperparathyroidism of renal origin: Secondary | ICD-10-CM | POA: Diagnosis not present

## 2017-01-30 DIAGNOSIS — N186 End stage renal disease: Secondary | ICD-10-CM | POA: Diagnosis not present

## 2017-01-30 DIAGNOSIS — D649 Anemia, unspecified: Secondary | ICD-10-CM | POA: Diagnosis not present

## 2017-01-30 DIAGNOSIS — D509 Iron deficiency anemia, unspecified: Secondary | ICD-10-CM | POA: Diagnosis not present

## 2017-02-04 DIAGNOSIS — N2581 Secondary hyperparathyroidism of renal origin: Secondary | ICD-10-CM | POA: Diagnosis not present

## 2017-02-04 DIAGNOSIS — D649 Anemia, unspecified: Secondary | ICD-10-CM | POA: Diagnosis not present

## 2017-02-04 DIAGNOSIS — D509 Iron deficiency anemia, unspecified: Secondary | ICD-10-CM | POA: Diagnosis not present

## 2017-02-04 DIAGNOSIS — N186 End stage renal disease: Secondary | ICD-10-CM | POA: Diagnosis not present

## 2017-02-07 ENCOUNTER — Other Ambulatory Visit: Payer: Self-pay | Admitting: Nephrology

## 2017-02-07 DIAGNOSIS — N186 End stage renal disease: Secondary | ICD-10-CM | POA: Diagnosis not present

## 2017-02-07 DIAGNOSIS — D649 Anemia, unspecified: Secondary | ICD-10-CM | POA: Diagnosis not present

## 2017-02-07 DIAGNOSIS — D509 Iron deficiency anemia, unspecified: Secondary | ICD-10-CM | POA: Diagnosis not present

## 2017-02-07 DIAGNOSIS — N2581 Secondary hyperparathyroidism of renal origin: Secondary | ICD-10-CM | POA: Diagnosis not present

## 2017-02-07 DIAGNOSIS — R188 Other ascites: Secondary | ICD-10-CM

## 2017-02-08 DIAGNOSIS — I491 Atrial premature depolarization: Secondary | ICD-10-CM | POA: Diagnosis not present

## 2017-02-08 DIAGNOSIS — Z7982 Long term (current) use of aspirin: Secondary | ICD-10-CM | POA: Diagnosis not present

## 2017-02-08 DIAGNOSIS — Z992 Dependence on renal dialysis: Secondary | ICD-10-CM | POA: Diagnosis not present

## 2017-02-08 DIAGNOSIS — K7460 Unspecified cirrhosis of liver: Secondary | ICD-10-CM | POA: Diagnosis not present

## 2017-02-08 DIAGNOSIS — N186 End stage renal disease: Secondary | ICD-10-CM | POA: Diagnosis not present

## 2017-02-08 DIAGNOSIS — I2510 Atherosclerotic heart disease of native coronary artery without angina pectoris: Secondary | ICD-10-CM | POA: Diagnosis not present

## 2017-02-08 DIAGNOSIS — I132 Hypertensive heart and chronic kidney disease with heart failure and with stage 5 chronic kidney disease, or end stage renal disease: Secondary | ICD-10-CM | POA: Diagnosis not present

## 2017-02-08 DIAGNOSIS — R188 Other ascites: Secondary | ICD-10-CM | POA: Diagnosis not present

## 2017-02-08 DIAGNOSIS — Z09 Encounter for follow-up examination after completed treatment for conditions other than malignant neoplasm: Secondary | ICD-10-CM | POA: Diagnosis not present

## 2017-02-08 DIAGNOSIS — Z951 Presence of aortocoronary bypass graft: Secondary | ICD-10-CM | POA: Diagnosis not present

## 2017-02-08 DIAGNOSIS — D500 Iron deficiency anemia secondary to blood loss (chronic): Secondary | ICD-10-CM | POA: Diagnosis not present

## 2017-02-08 DIAGNOSIS — I5032 Chronic diastolic (congestive) heart failure: Secondary | ICD-10-CM | POA: Diagnosis not present

## 2017-02-10 ENCOUNTER — Ambulatory Visit
Admission: RE | Admit: 2017-02-10 | Discharge: 2017-02-10 | Disposition: A | Discharge status: Home / Self Care | Payer: Medicare Other | Source: Ambulatory Visit | Attending: Nephrology | Admitting: Nephrology

## 2017-02-10 DIAGNOSIS — R188 Other ascites: Secondary | ICD-10-CM | POA: Diagnosis not present

## 2017-02-10 DIAGNOSIS — Z992 Dependence on renal dialysis: Secondary | ICD-10-CM | POA: Diagnosis not present

## 2017-02-10 DIAGNOSIS — N186 End stage renal disease: Secondary | ICD-10-CM | POA: Diagnosis not present

## 2017-02-10 MED ORDER — ALBUMIN HUMAN 25 % IV SOLN
25.0000 g | Freq: Once | INTRAVENOUS | Status: AC
  Administered 2017-02-10: 25 g via INTRAVENOUS
  Filled 2017-02-10: qty 100

## 2017-02-10 MED ORDER — ALBUMIN HUMAN 25 % IV SOLN
INTRAVENOUS | Status: AC
  Administered 2017-02-10: 25 g
  Filled 2017-02-10: qty 200

## 2017-02-10 NOTE — Procedures (Signed)
PROCEDURE SUMMARY:  Successful US guided paracentesis from right lateral abdomen.  Yielded 7.7 liters of yellow fluid.  No immediate complications.  Pt tolerated well.   Specimen was not sent for labs.  Docia Barrier PA-C 02/10/2017 11:50 AM

## 2017-02-11 DIAGNOSIS — D509 Iron deficiency anemia, unspecified: Secondary | ICD-10-CM | POA: Diagnosis not present

## 2017-02-11 DIAGNOSIS — N186 End stage renal disease: Secondary | ICD-10-CM | POA: Diagnosis not present

## 2017-02-11 DIAGNOSIS — D649 Anemia, unspecified: Secondary | ICD-10-CM | POA: Diagnosis not present

## 2017-02-11 DIAGNOSIS — N2581 Secondary hyperparathyroidism of renal origin: Secondary | ICD-10-CM | POA: Diagnosis not present

## 2017-02-15 IMAGING — CR DG CHEST 2V
2 series · 2 of 2 positions shown · non-contrast
Comparison: 03/28/2014 and earlier

CLINICAL DATA: 54-year-old male with cirrhosis. Scheduled for
upcoming paracentesis. Increased abdominal distention and shortness
of breath today. Initial encounter.

EXAM:
CHEST  2 VIEW

[chest lat]
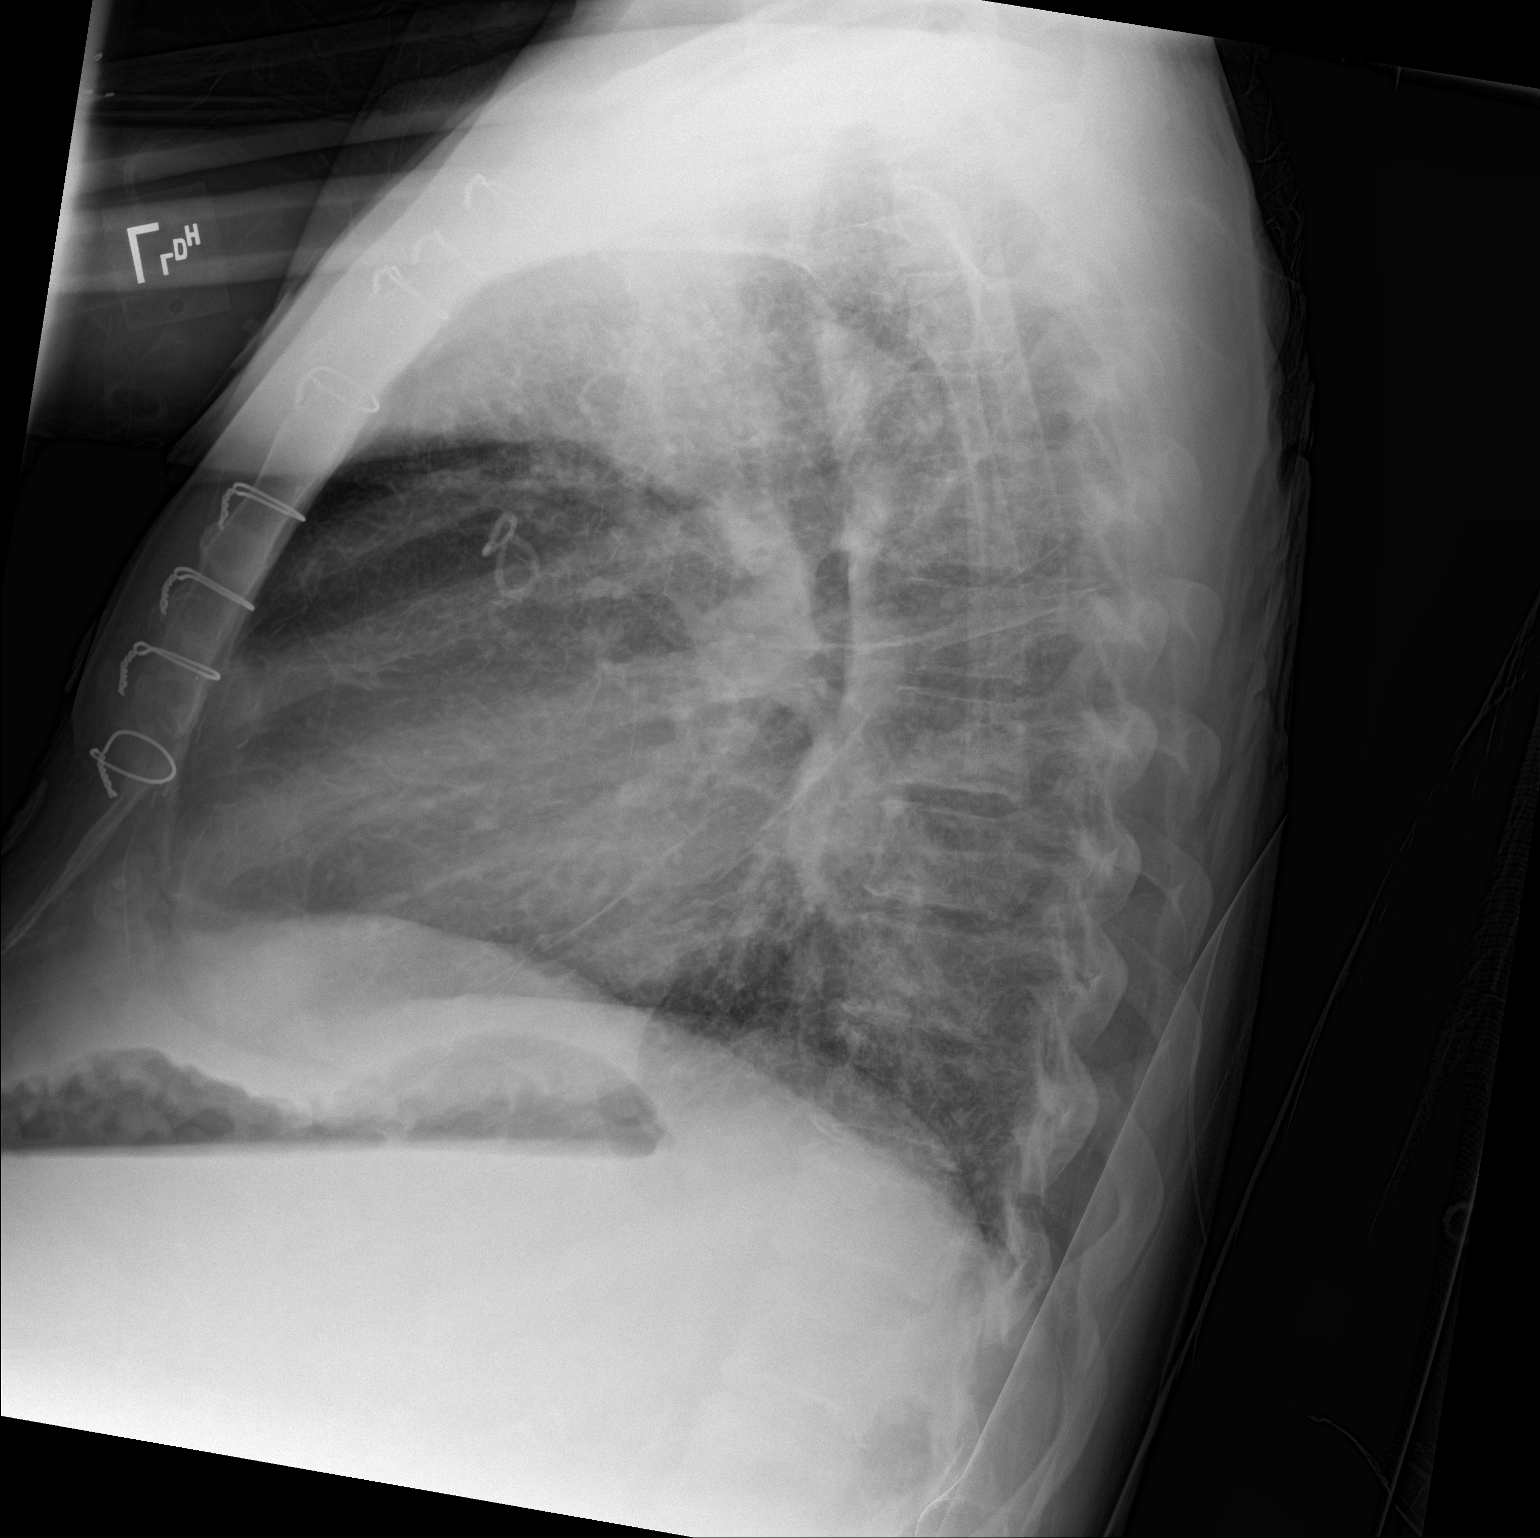

[chest ap]
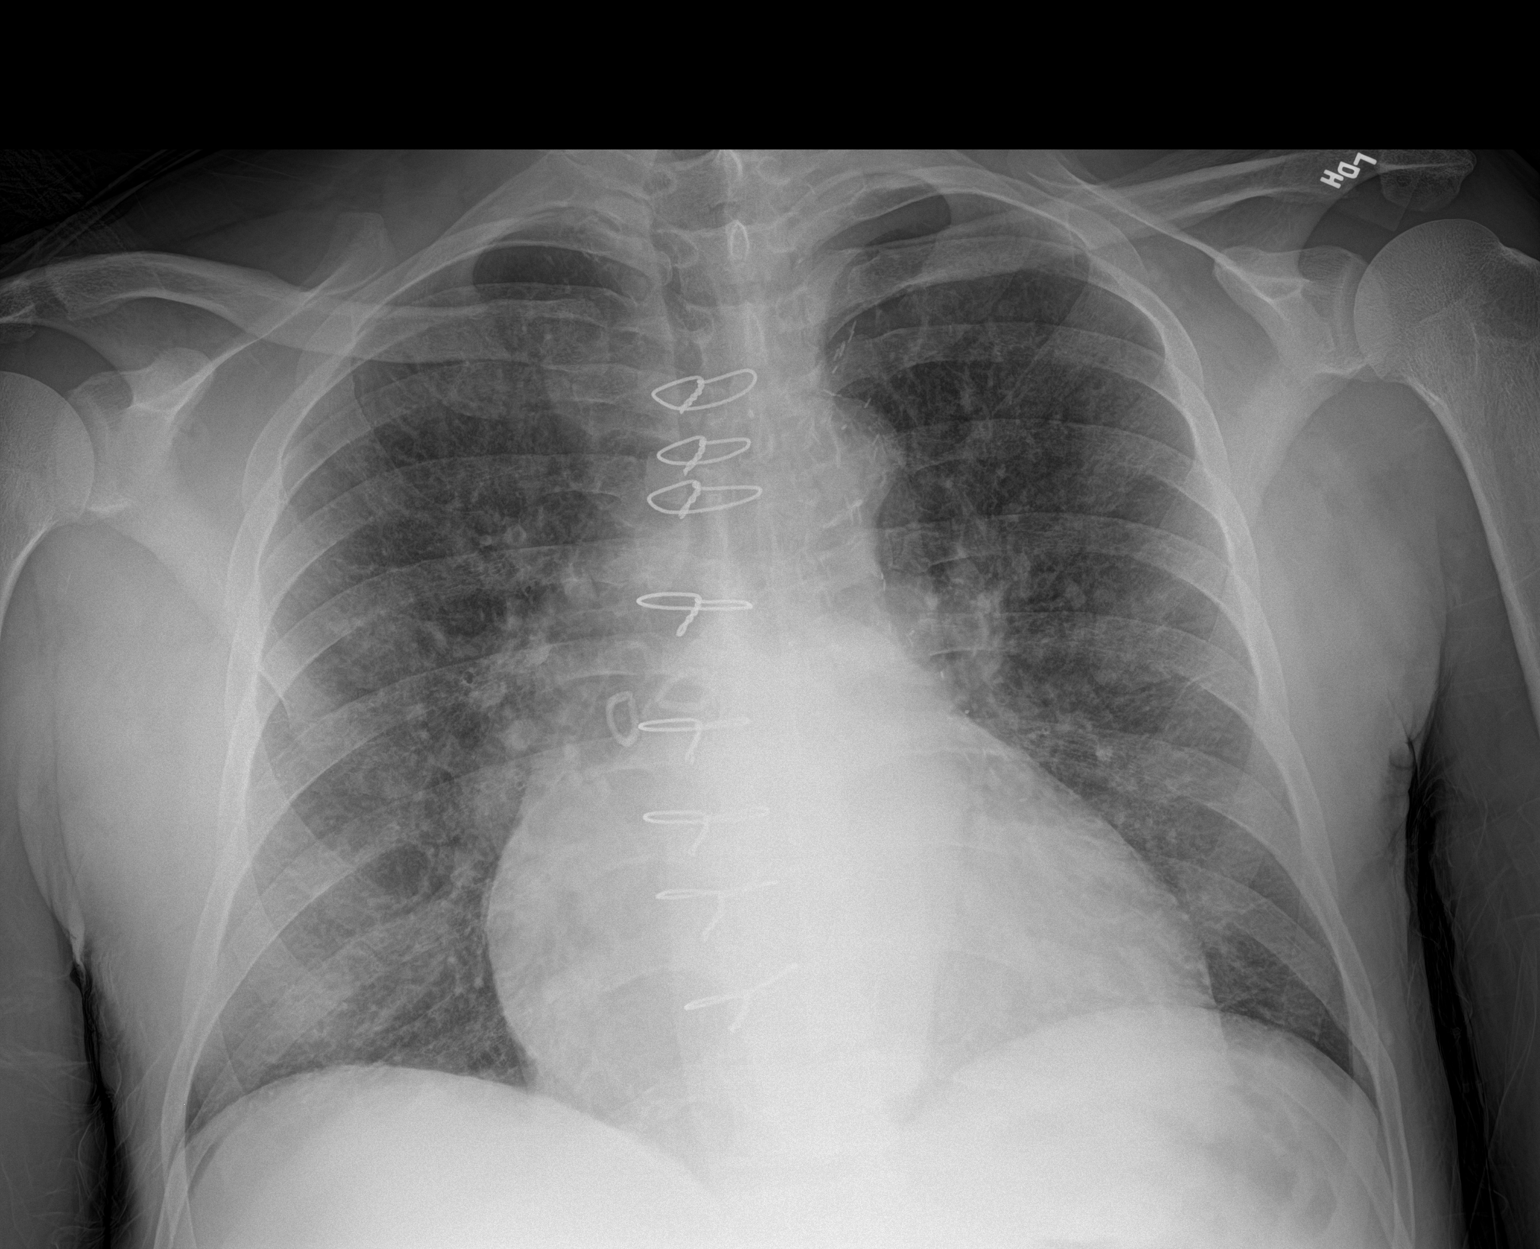

[2 of 2 positions shown; findings below may reference images not displayed]

FINDINGS: Seated AP and lateral views of the chest. Sequelae of CABG. Lower
lung volumes. Stable cardiomegaly and mediastinal contours. No
pleural effusion or pneumothorax. No consolidation. Chronic but
increased interstitial markings in both lungs. Visualized tracheal
air column is within normal limits. No acute osseous abnormality
identified. No pneumoperitoneum.
IMPRESSION: Lower lung volumes with chronic but increased interstitial markings
favor due to pulmonary vascular congestion. Stable cardiomegaly, no
pleural effusion.

## 2017-02-16 DIAGNOSIS — N2581 Secondary hyperparathyroidism of renal origin: Secondary | ICD-10-CM | POA: Diagnosis not present

## 2017-02-16 DIAGNOSIS — N186 End stage renal disease: Secondary | ICD-10-CM | POA: Diagnosis not present

## 2017-02-16 DIAGNOSIS — D649 Anemia, unspecified: Secondary | ICD-10-CM | POA: Diagnosis not present

## 2017-02-16 DIAGNOSIS — D509 Iron deficiency anemia, unspecified: Secondary | ICD-10-CM | POA: Diagnosis not present

## 2017-02-16 IMAGING — US US PARACENTESIS
1 series · 5 of 5 positions shown · non-contrast
Comparison: 01/28/2015

CLINICAL DATA: Recurrent symptomatic ascites, abdominal distention

EXAM:
ULTRASOUND GUIDED PARACENTESIS

[Series 1: us paracentesis · 0.26mm/px · 5 of 5 slices shown]
[im 1/5]
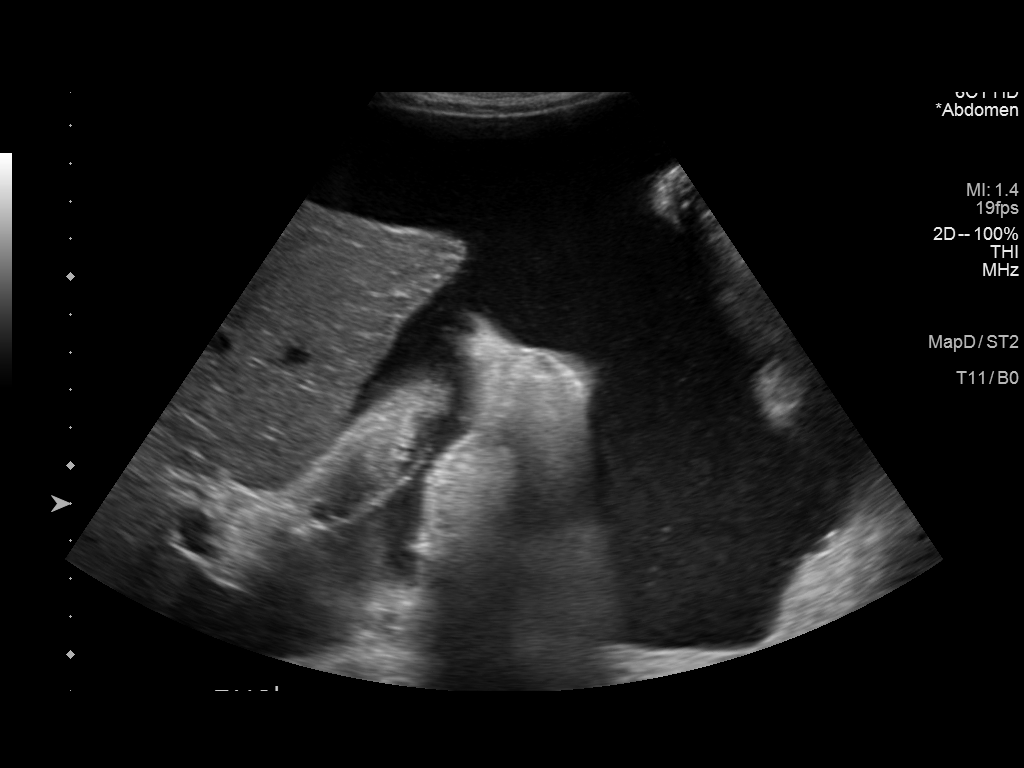
[im 2/5]
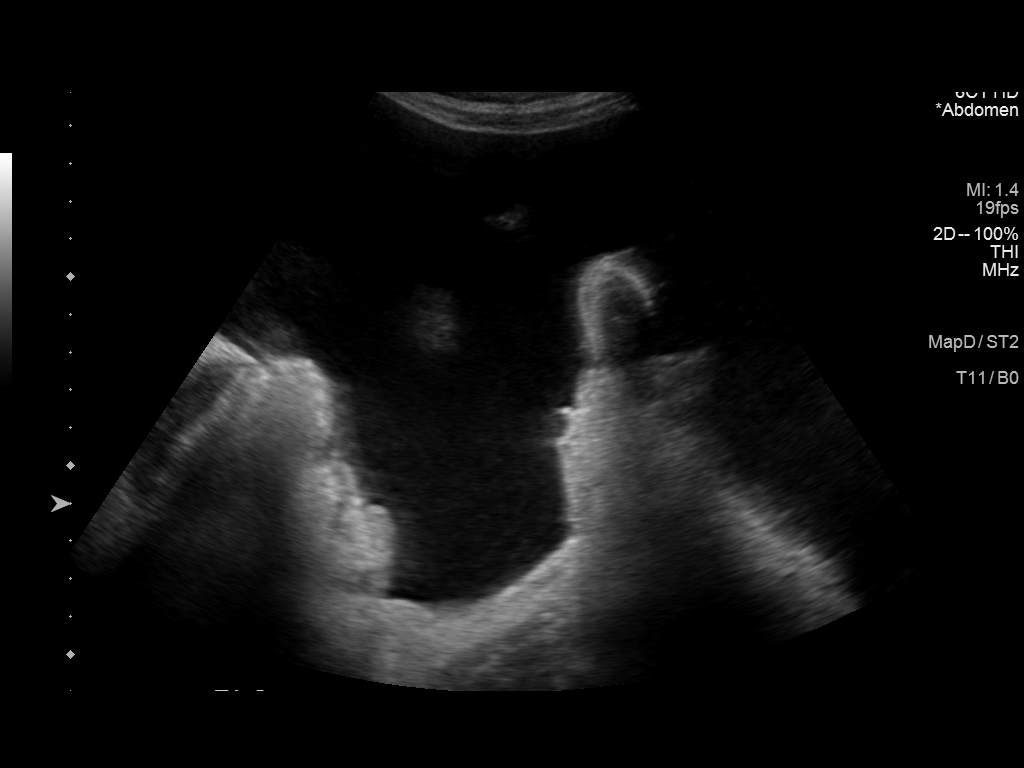
[im 3/5]
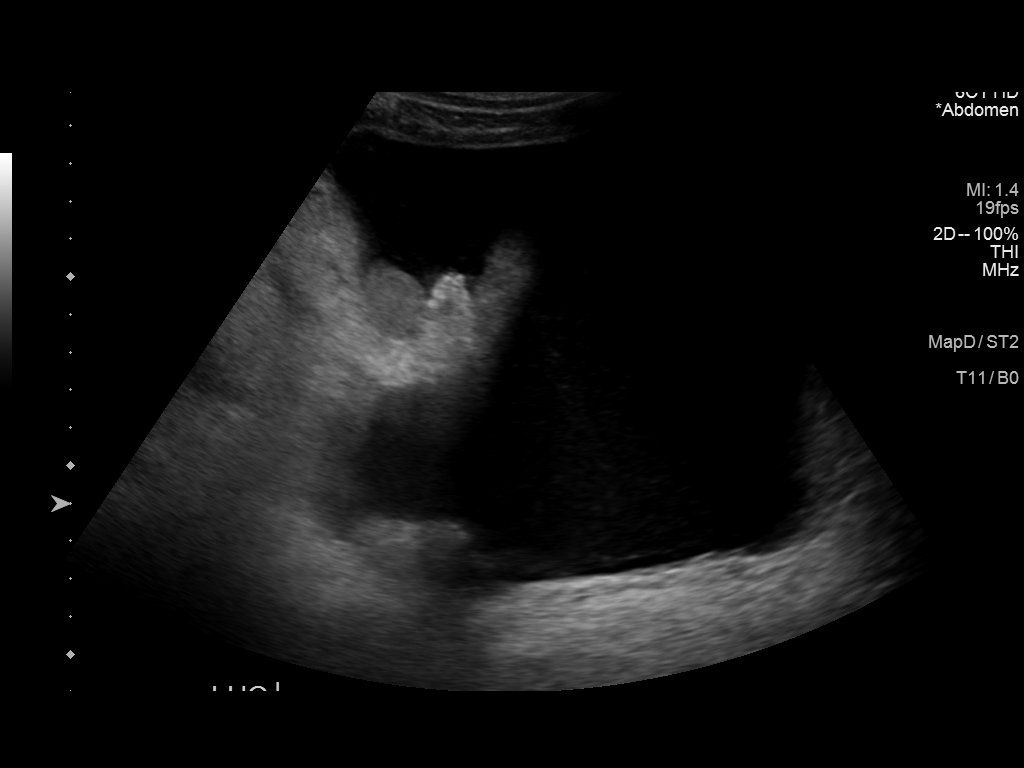
[im 4/5]
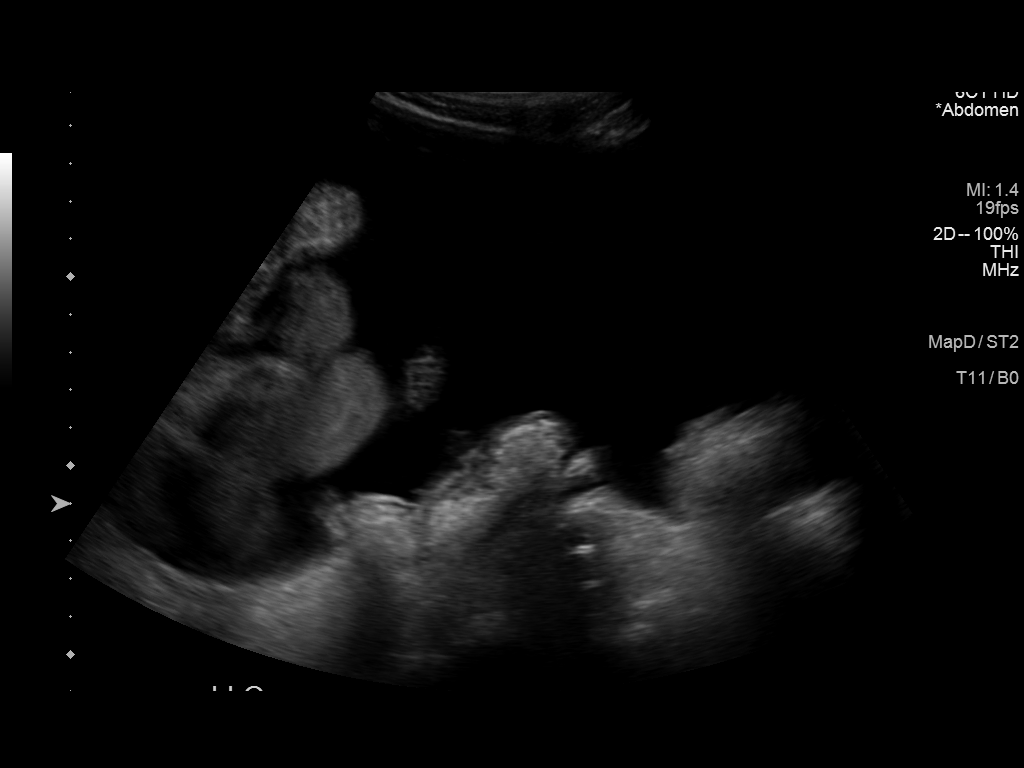
[im 5/5]
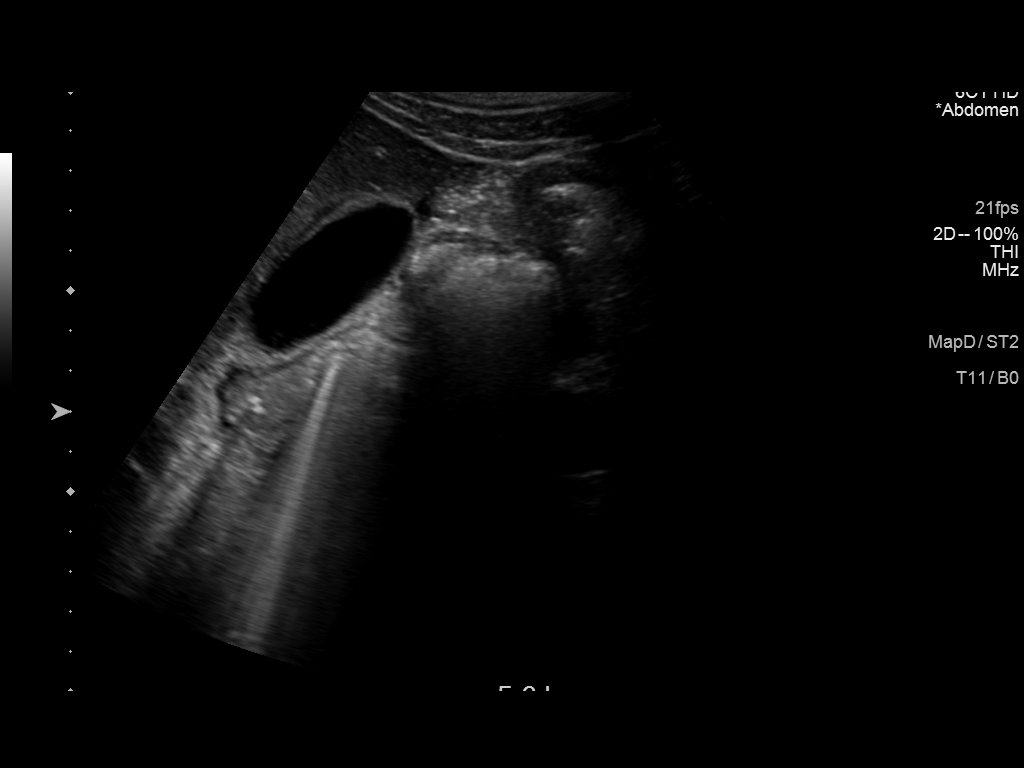

[5 of 5 positions shown; findings below may reference images not displayed]

PROCEDURE:
An ultrasound guided paracentesis was thoroughly discussed with the
patient and questions answered. The benefits, risks, alternatives
and complications were also discussed. The patient understands and
wishes to proceed with the procedure. Written consent was obtained.

Ultrasound was performed to localize and mark an adequate pocket of
fluid in the right lower quadrant of the abdomen. The area was then
prepped and draped in the normal sterile fashion. 1% Lidocaine was
used for local anesthesia. Under ultrasound guidance a safety
centesis needle catheter was introduced. Paracentesis was performed.
The catheter was removed and a dressing applied.

COMPLICATIONS:
None immediate
FINDINGS: A total of approximately 5.6 L of clear peritoneal fluid was
removed. A fluid sample was sent for laboratory analysis.
IMPRESSION: Successful ultrasound guided paracentesis yielding 5.6 L of ascites.

## 2017-02-17 ENCOUNTER — Other Ambulatory Visit: Payer: Self-pay | Admitting: Nephrology

## 2017-02-17 DIAGNOSIS — R188 Other ascites: Secondary | ICD-10-CM

## 2017-02-17 IMAGING — CR DG CHEST 1V
1 series · 1 of 1 positions shown · non-contrast
Comparison: 03/23/2015

CLINICAL DATA: Fever. Cad, htn, end stage renal disease on
dialysis. Coronary artery bypass graft 1441.

EXAM:
CHEST 1 VIEW

[ap]
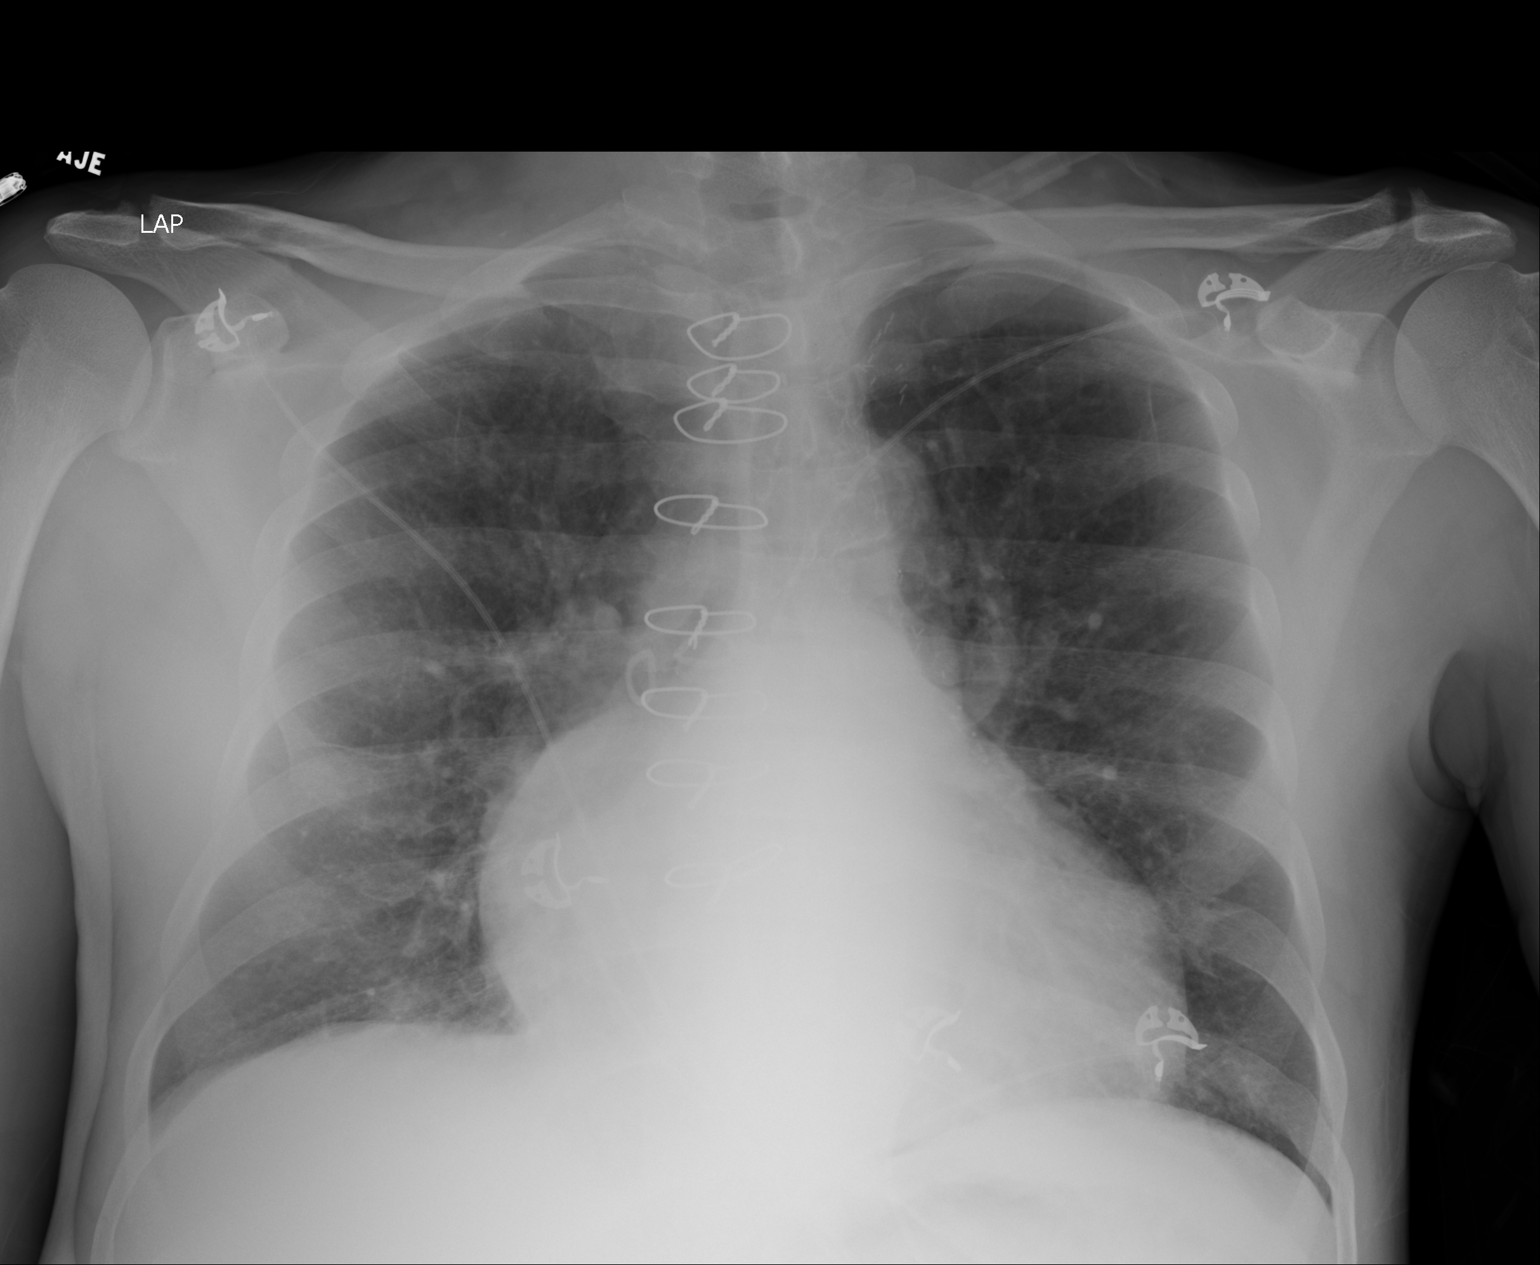

[1 of 1 positions shown; findings below may reference images not displayed]

FINDINGS: Status post median sternotomy and CABG. Heart is enlarged. There is
pulmonary vascular congestion. No overt alveolar edema identified.
There are no consolidations or pleural effusions.
IMPRESSION: 1. Postoperative changes.
2. Cardiomegaly and vascular congestion.

## 2017-02-18 DIAGNOSIS — N2581 Secondary hyperparathyroidism of renal origin: Secondary | ICD-10-CM | POA: Diagnosis not present

## 2017-02-18 DIAGNOSIS — D509 Iron deficiency anemia, unspecified: Secondary | ICD-10-CM | POA: Diagnosis not present

## 2017-02-18 DIAGNOSIS — N186 End stage renal disease: Secondary | ICD-10-CM | POA: Diagnosis not present

## 2017-02-18 DIAGNOSIS — D649 Anemia, unspecified: Secondary | ICD-10-CM | POA: Diagnosis not present

## 2017-02-18 IMAGING — CR DG CHEST 2V
1 series · 3 of 3 positions shown · non-contrast
Comparison: 03/25/2015

CLINICAL DATA: Productive cough for several months. Low-grade
fever. End-stage renal disease on dialysis.

EXAM:
CHEST  2 VIEW

[Series 1: dg chest 2 view · 0.14mm/px · 3 of 3 slices shown]
[im 1/3]
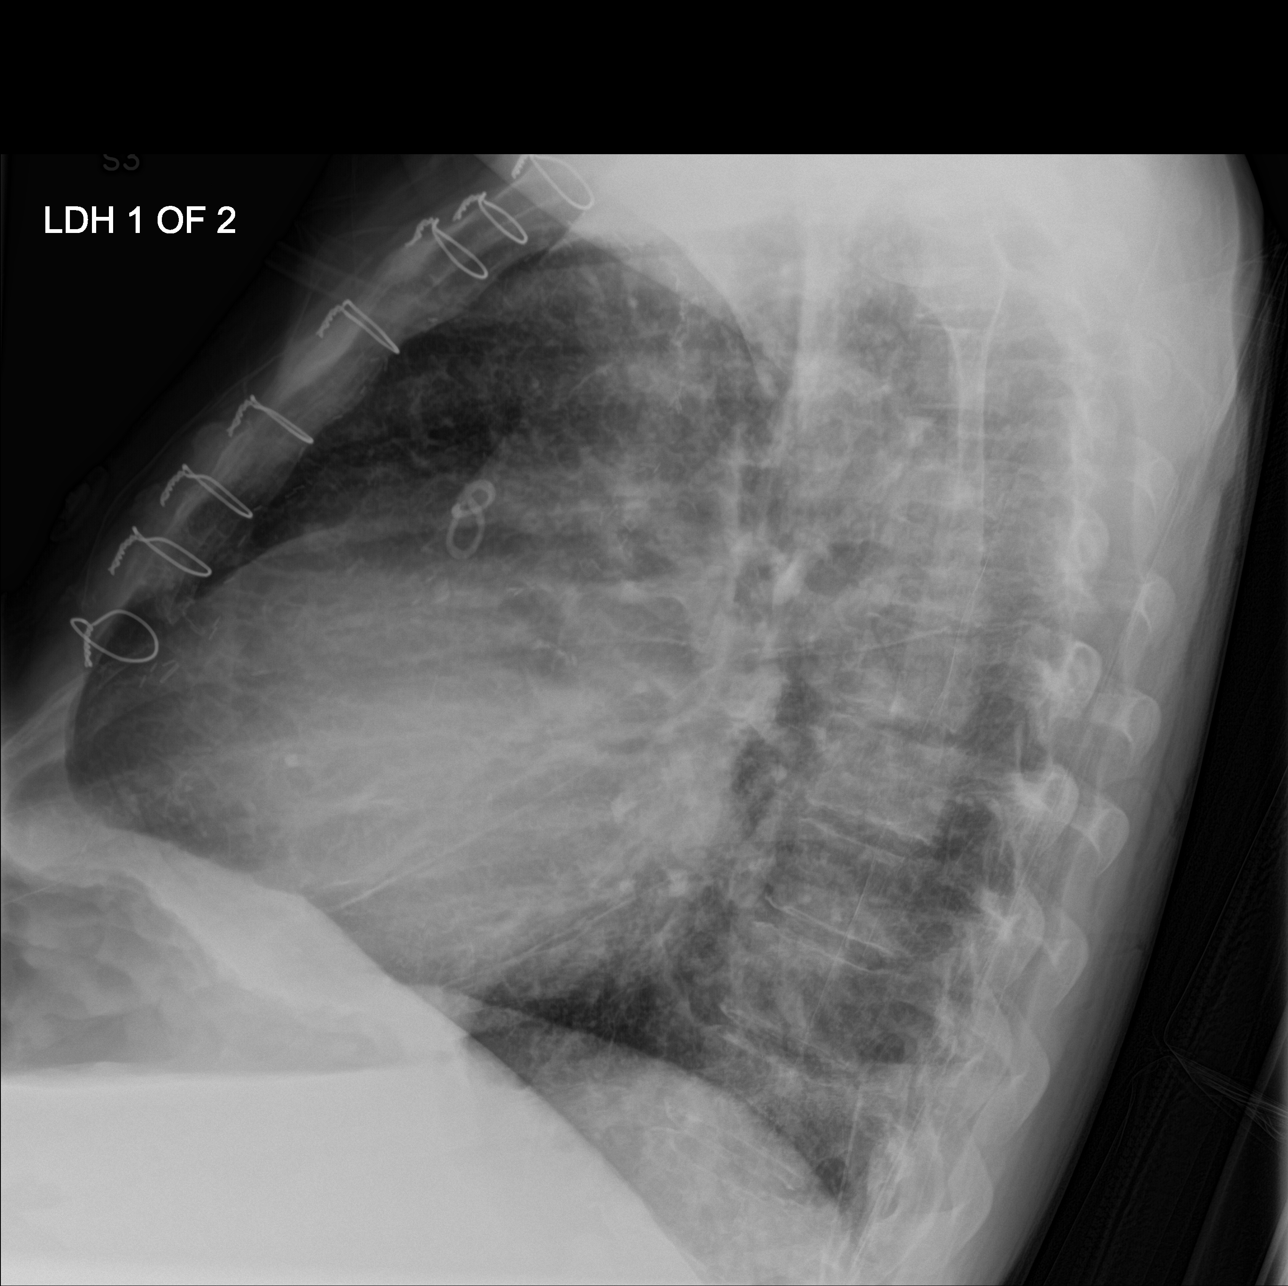
[im 2/3]
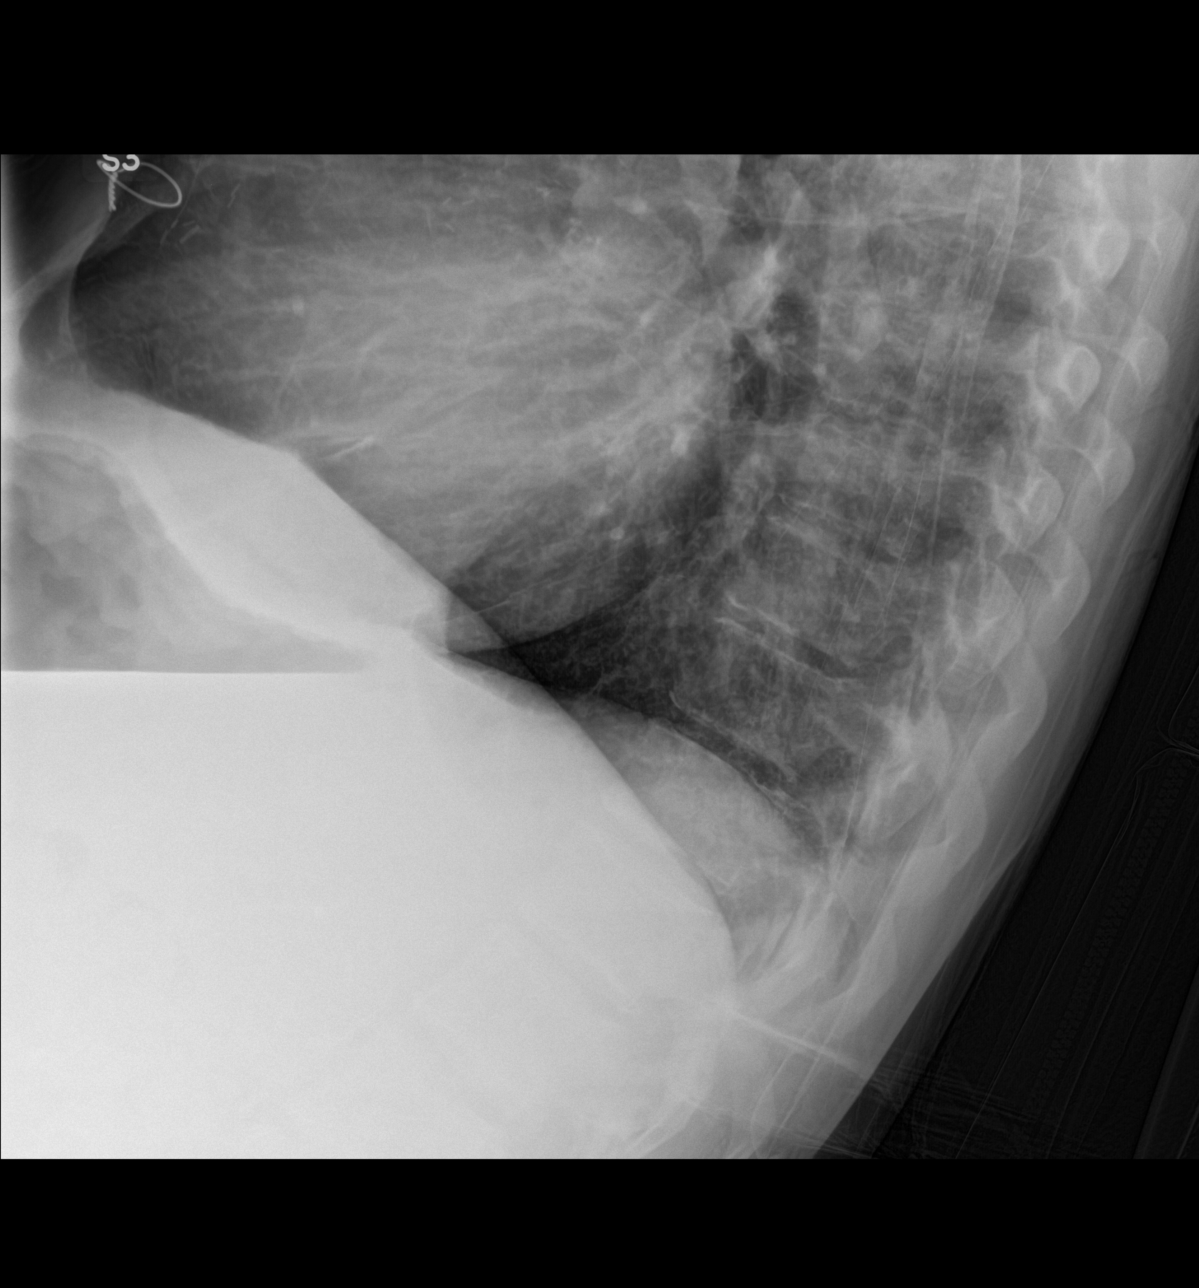
[im 3/3]
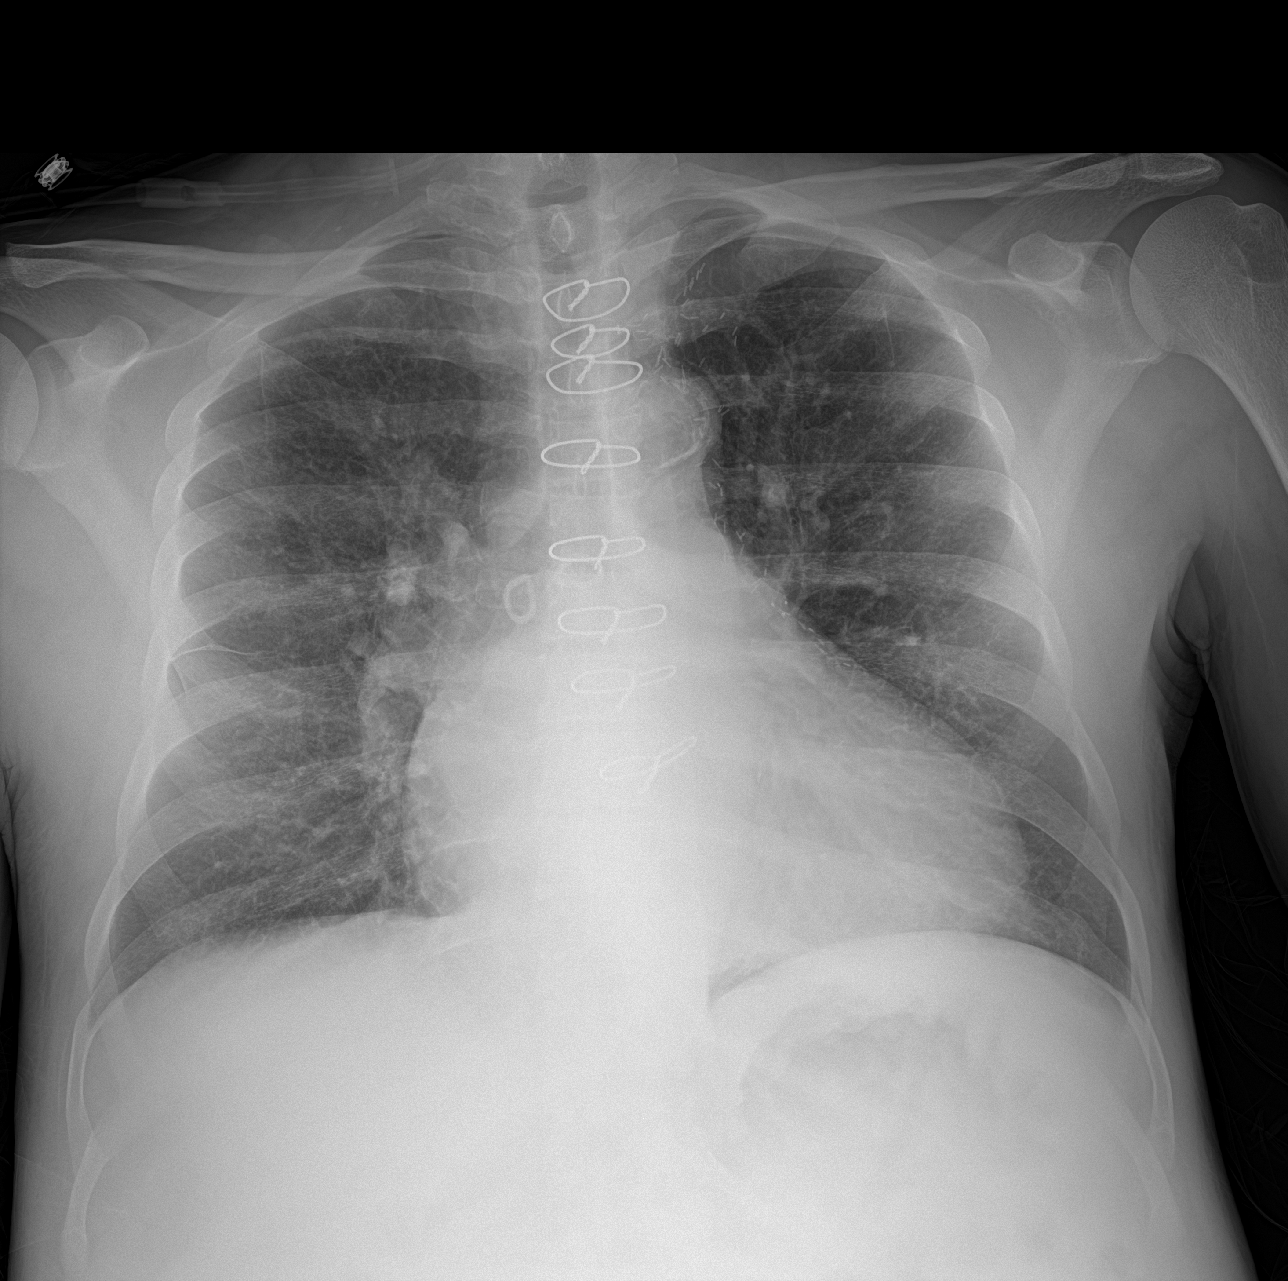

[3 of 3 positions shown; findings below may reference images not displayed]

FINDINGS: Moderate cardiomegaly is stable. Chronic pulmonary vascular
congestion is stable. No evidence of pulmonary airspace disease or
frank pulmonary edema. No evidence of pleural effusion. Prior CABG
again noted.
IMPRESSION: Stable cardiomegaly and chronic pulmonary venous hypertension. No
acute findings.

## 2017-02-19 ENCOUNTER — Encounter: Payer: Self-pay | Admitting: Emergency Medicine

## 2017-02-19 ENCOUNTER — Emergency Department
Admission: EM | Admit: 2017-02-19 | Discharge: 2017-02-19 | Disposition: A | Discharge status: Home / Self Care | Payer: Medicare Other | Source: Home / Self Care | Attending: Emergency Medicine | Admitting: Emergency Medicine

## 2017-02-19 ENCOUNTER — Emergency Department: Payer: Medicare Other

## 2017-02-19 ENCOUNTER — Other Ambulatory Visit: Payer: Self-pay

## 2017-02-19 DIAGNOSIS — Z79899 Other long term (current) drug therapy: Secondary | ICD-10-CM | POA: Insufficient documentation

## 2017-02-19 DIAGNOSIS — J189 Pneumonia, unspecified organism: Secondary | ICD-10-CM

## 2017-02-19 DIAGNOSIS — N2581 Secondary hyperparathyroidism of renal origin: Secondary | ICD-10-CM | POA: Diagnosis not present

## 2017-02-19 DIAGNOSIS — N186 End stage renal disease: Secondary | ICD-10-CM

## 2017-02-19 DIAGNOSIS — J810 Acute pulmonary edema: Secondary | ICD-10-CM | POA: Diagnosis not present

## 2017-02-19 DIAGNOSIS — I2510 Atherosclerotic heart disease of native coronary artery without angina pectoris: Secondary | ICD-10-CM

## 2017-02-19 DIAGNOSIS — I110 Hypertensive heart disease with heart failure: Secondary | ICD-10-CM

## 2017-02-19 DIAGNOSIS — F17210 Nicotine dependence, cigarettes, uncomplicated: Secondary | ICD-10-CM

## 2017-02-19 DIAGNOSIS — Z7982 Long term (current) use of aspirin: Secondary | ICD-10-CM

## 2017-02-19 DIAGNOSIS — J449 Chronic obstructive pulmonary disease, unspecified: Secondary | ICD-10-CM | POA: Insufficient documentation

## 2017-02-19 DIAGNOSIS — R040 Epistaxis: Secondary | ICD-10-CM

## 2017-02-19 DIAGNOSIS — I5032 Chronic diastolic (congestive) heart failure: Secondary | ICD-10-CM | POA: Diagnosis not present

## 2017-02-19 DIAGNOSIS — I509 Heart failure, unspecified: Secondary | ICD-10-CM | POA: Insufficient documentation

## 2017-02-19 DIAGNOSIS — I132 Hypertensive heart and chronic kidney disease with heart failure and with stage 5 chronic kidney disease, or end stage renal disease: Secondary | ICD-10-CM

## 2017-02-19 DIAGNOSIS — J181 Lobar pneumonia, unspecified organism: Secondary | ICD-10-CM | POA: Insufficient documentation

## 2017-02-19 DIAGNOSIS — Z992 Dependence on renal dialysis: Secondary | ICD-10-CM | POA: Insufficient documentation

## 2017-02-19 DIAGNOSIS — R188 Other ascites: Secondary | ICD-10-CM | POA: Diagnosis not present

## 2017-02-19 DIAGNOSIS — Z951 Presence of aortocoronary bypass graft: Secondary | ICD-10-CM | POA: Insufficient documentation

## 2017-02-19 MED ORDER — LEVOFLOXACIN 750 MG PO TABS
750.0000 mg | ORAL_TABLET | Freq: Once | ORAL | Status: AC
  Administered 2017-02-19: 750 mg via ORAL
  Filled 2017-02-19: qty 1

## 2017-02-19 MED ORDER — LEVOFLOXACIN 750 MG PO TABS: 750.0000 mg | ORAL_TABLET | Freq: Every day | ORAL | 0 refills | Status: AC

## 2017-02-19 MED ORDER — IPRATROPIUM-ALBUTEROL 0.5-2.5 (3) MG/3ML IN SOLN
3.0000 mL | Freq: Once | RESPIRATORY_TRACT | Status: AC
  Administered 2017-02-19: 3 mL via RESPIRATORY_TRACT
  Filled 2017-02-19: qty 3

## 2017-02-19 MED ORDER — ALBUTEROL SULFATE HFA 108 (90 BASE) MCG/ACT IN AERS: 2.0000 | INHALATION_SPRAY | Freq: Four times a day (QID) | RESPIRATORY_TRACT | 0 refills | Status: DC | PRN

## 2017-02-19 MED ORDER — SPACER/AERO CHAMBER MOUTHPIECE MISC: 1.0000 [IU] | 0 refills | Status: DC | PRN

## 2017-02-19 NOTE — Discharge Instructions (Addendum)
Please take your antibiotics as prescribed make sure you follow-up with your primary care physician in 2 days for recheck.  Return immediately to the emergency department for any new or worsening symptoms ulcers fevers, chills, worsening shortness of breath, or for any other issues whatsoever.  It was a pleasure to take care of you today, and thank you for coming to our emergency department.  If you have any questions or concerns before leaving please ask the nurse to grab me and I'm more than happy to go through your aftercare instructions again.  If you were prescribed any opioid pain medication today such as Norco, Vicodin, Percocet, morphine, hydrocodone, or oxycodone please make sure you do not drive when you are taking this medication as it can alter your ability to drive safely.  If you have any concerns once you are home that you are not improving or are in fact getting worse before you can make it to your follow-up appointment, please do not hesitate to call 911 and come back for further evaluation.  Darel Hong, MD  Results for orders placed or performed during the hospital encounter of 01/22/17  Comprehensive metabolic panel  Result Value Ref Range   Sodium 140 135 - 145 mmol/L   Potassium 6.1 (H) 3.5 - 5.1 mmol/L   Chloride 95 (L) 101 - 111 mmol/L   CO2 30 22 - 32 mmol/L   Glucose, Bld 91 65 - 99 mg/dL   BUN 47 (H) 6 - 20 mg/dL   Creatinine, Ser 9.49 (H) 0.61 - 1.24 mg/dL   Calcium 8.9 8.9 - 10.3 mg/dL   Total Protein 6.7 6.5 - 8.1 g/dL   Albumin 2.4 (L) 3.5 - 5.0 g/dL   AST 29 15 - 41 U/L   ALT 18 17 - 63 U/L   Alkaline Phosphatase 183 (H) 38 - 126 U/L   Total Bilirubin 0.5 0.3 - 1.2 mg/dL   GFR calc non Af Amer 5 (L) >60 mL/min   GFR calc Af Amer 6 (L) >60 mL/min   Anion gap 15 5 - 15  CBC with Differential  Result Value Ref Range   WBC 10.2 3.8 - 10.6 K/uL   RBC 3.32 (L) 4.40 - 5.90 MIL/uL   Hemoglobin 8.5 (L) 13.0 - 18.0 g/dL   HCT 27.7 (L) 40.0 - 52.0 %   MCV  83.3 80.0 - 100.0 fL   MCH 25.6 (L) 26.0 - 34.0 pg   MCHC 30.7 (L) 32.0 - 36.0 g/dL   RDW 21.8 (H) 11.5 - 14.5 %   Platelets 379 150 - 440 K/uL   Neutrophils Relative % 72 %   Neutro Abs 7.3 (H) 1.4 - 6.5 K/uL   Lymphocytes Relative 11 %   Lymphs Abs 1.1 1.0 - 3.6 K/uL   Monocytes Relative 11 %   Monocytes Absolute 1.1 (H) 0.2 - 1.0 K/uL   Eosinophils Relative 5 %   Eosinophils Absolute 0.6 0 - 0.7 K/uL   Basophils Relative 1 %   Basophils Absolute 0.1 0 - 0.1 K/uL  Basic metabolic panel  Result Value Ref Range   Sodium 143 135 - 145 mmol/L   Potassium 6.2 (H) 3.5 - 5.1 mmol/L   Chloride 95 (L) 101 - 111 mmol/L   CO2 33 (H) 22 - 32 mmol/L   Glucose, Bld 83 65 - 99 mg/dL   BUN 50 (H) 6 - 20 mg/dL   Creatinine, Ser 9.69 (H) 0.61 - 1.24 mg/dL   Calcium 8.6 (L)  8.9 - 10.3 mg/dL   GFR calc non Af Amer 5 (L) >60 mL/min   GFR calc Af Amer 6 (L) >60 mL/min   Anion gap 15 5 - 15  CBC  Result Value Ref Range   WBC 9.4 3.8 - 10.6 K/uL   RBC 3.24 (L) 4.40 - 5.90 MIL/uL   Hemoglobin 8.1 (L) 13.0 - 18.0 g/dL   HCT 26.7 (L) 40.0 - 52.0 %   MCV 82.5 80.0 - 100.0 fL   MCH 25.0 (L) 26.0 - 34.0 pg   MCHC 30.3 (L) 32.0 - 36.0 g/dL   RDW 20.9 (H) 11.5 - 14.5 %   Platelets 358 150 - 440 K/uL  APTT  Result Value Ref Range   aPTT 32 24 - 36 seconds  Protime-INR  Result Value Ref Range   Prothrombin Time 13.7 11.4 - 15.2 seconds   INR 1.06   Hemoglobin and hematocrit, blood  Result Value Ref Range   Hemoglobin 7.2 (L) 13.0 - 18.0 g/dL   HCT 23.5 (L) 40.0 - 52.0 %  Hemoglobin and hematocrit, blood  Result Value Ref Range   Hemoglobin 8.4 (L) 13.0 - 18.0 g/dL   HCT 27.0 (L) 40.0 - 52.0 %  Potassium  Result Value Ref Range   Potassium 6.3 (HH) 3.5 - 5.1 mmol/L  Phosphorus  Result Value Ref Range   Phosphorus 4.6 2.5 - 4.6 mg/dL  Hemoglobin  Result Value Ref Range   Hemoglobin 7.9 (L) 13.0 - 18.0 g/dL  Basic metabolic panel  Result Value Ref Range   Sodium 137 135 - 145 mmol/L    Potassium 4.3 3.5 - 5.1 mmol/L   Chloride 97 (L) 101 - 111 mmol/L   CO2 29 22 - 32 mmol/L   Glucose, Bld 103 (H) 65 - 99 mg/dL   BUN 28 (H) 6 - 20 mg/dL   Creatinine, Ser 5.84 (H) 0.61 - 1.24 mg/dL   Calcium 7.4 (L) 8.9 - 10.3 mg/dL   GFR calc non Af Amer 10 (L) >60 mL/min   GFR calc Af Amer 11 (L) >60 mL/min   Anion gap 11 5 - 15  Iron and TIBC  Result Value Ref Range   Iron 42 (L) 45 - 182 ug/dL   TIBC 246 (L) 250 - 450 ug/dL   Saturation Ratios 17 (L) 17.9 - 39.5 %   UIBC 204 ug/dL  Folate RBC  Result Value Ref Range   Folate, Hemolysate 596.2 Not Estab. ng/mL   Hematocrit 25.5 (L) 37.5 - 51.0 %   Folate, RBC 2,338 >498 ng/mL  Ferritin  Result Value Ref Range   Ferritin 20 (L) 24 - 336 ng/mL  Miscellaneous [facility] test (send-out)  Result Value Ref Range   [facility] test code [phone]    [facility] test name ASCORBIC ACID    Misc [facility] result COMMENT   VITAMIN D 25 Hydroxy (Vit-D Deficiency, Fractures)  Result Value Ref Range   Vit D, 25-Hydroxy 23.4 (L) 30.0 - 100.0 ng/mL  Haptoglobin  Result Value Ref Range   Haptoglobin 243 (H) 34 - 200 mg/dL  CBC  Result Value Ref Range   WBC 10.1 3.8 - 10.6 K/uL   RBC 3.47 (L) 4.40 - 5.90 MIL/uL   Hemoglobin 9.4 (L) 13.0 - 18.0 g/dL   HCT 29.4 (L) 40.0 - 52.0 %   MCV 84.5 80.0 - 100.0 fL   MCH 26.9 26.0 - 34.0 pg   MCHC 31.9 (L) 32.0 - 36.0 g/dL   RDW 19.9 (  H) 11.5 - 14.5 %   Platelets 259 150 - 440 K/uL  Type and screen [place]  Result Value Ref Range   ABO/RH(D) A POS    Antibody Screen NEG    Sample Expiration 01/25/2017    Unit Number [id number]    Blood Component Type RED CELLS,LR    Unit division 00    Status of Unit REL FROM [facility]    Transfusion Status OK TO TRANSFUSE    Crossmatch Result Compatible    Unit Number [id number]    Blood Component Type RBC LR PHER2    Unit division 00    Status of Unit REL FROM [facility]    Transfusion Status OK TO TRANSFUSE    Crossmatch Result Compatible     Unit Number [id number]    Blood Component Type RBC LR PHER2    Unit division 00    Status of Unit REL FROM [facility]    Transfusion Status OK TO TRANSFUSE    Crossmatch Result Compatible    Unit Number [id number]    Blood Component Type RED CELLS,LR    Unit division 00    Status of Unit ISSUED,FINAL    Transfusion Status OK TO TRANSFUSE    Crossmatch Result Compatible    Unit Number [id number]    Blood Component Type RBC, LR IRR    Unit division 00    Status of Unit ISSUED,FINAL    Unit tag comment SHORT DATE, USE FIRST    Transfusion Status OK TO TRANSFUSE    Crossmatch Result Compatible   Prepare RBC  Result Value Ref Range   Order Confirmation ORDER PROCESSED BY BLOOD BANK   Prepare RBC  Result Value Ref Range   Order Confirmation      DUPLICATE REQUEST 2 RBCS CURRENTLY ALLOCATED AND READY FOR TRANSFUSION 01/23/17 0851 [facility]  Prepare RBC  Result Value Ref Range   Order Confirmation      DUPLICATE REQUEST ONE RBC CURRENTLY ALLOCATED AND READY FOR TRANSFUSION 01/24/17 0757 [facility]  BPAM RBC  Result Value Ref Range   ISSUE DATE / TIME 259563875643    Blood Product Unit Number [id number]    PRODUCT CODE [id number]    Unit Type and Rh 3235    Blood Product Expiration Date 573220254270    Blood Product Unit Number [id number]    Unit Type and Rh 1607    Blood Product Expiration Date 371062694854    Blood Product Unit Number [id number]    Unit Type and Rh 2993    Blood Product Expiration Date 716967893810    ISSUE DATE / TIME 175102585277    Blood Product Unit Number [id number]    PRODUCT CODE [id number]    Unit Type and Rh 1950    Blood Product Expiration Date 932671245809    ISSUE DATE / TIME 983382505397    Blood Product Unit Number [id number]    PRODUCT CODE [id number]    Unit Type and Rh 9500    Blood Product Expiration Date 242683419622    US Paracentesis  Result Date: 02/10/2017 INDICATION: Patient with recurrent ascites. Request is made for  therapeutic paracentesis. EXAM: ULTRASOUND GUIDED THERAPEUTIC PARACENTESIS MEDICATIONS: 5 mL 1% lidocaine COMPLICATIONS: None immediate. PROCEDURE: Informed written consent was obtained from the patient after a discussion of the risks, benefits and alternatives to treatment. A timeout was performed prior to the initiation of the procedure. Initial ultrasound scanning demonstrates a large amount of ascites within the right lateral abdomen. The right lateral abdomen was  prepped and draped in the usual sterile fashion. 1% lidocaine was used for local anesthesia. Following this, a 19 gauge, 10-cm, Yueh catheter was introduced. An ultrasound image was saved for documentation purposes. The paracentesis was performed. The catheter was removed and a dressing was applied. The patient tolerated the procedure well without immediate post procedural complication. FINDINGS: A total of approximately 7.7 liters of yellow fluid was removed. IMPRESSION: Successful ultrasound-guided therapeutic paracentesis yielding 7.7 liters of peritoneal fluid. Read by:  Alfonzo Feller PA-C Electronically Signed   By: [name] M.D.   On: 02/10/2017 11:53   Dg Chest Port 1 View  Result Date: 02/19/2017 CLINICAL DATA:  Nose bleed.  Dialysis patient. EXAM: PORTABLE CHEST 1 VIEW COMPARISON:  01/07/2017 FINDINGS: Previous median sternotomy and CABG. Chronic cardiomegaly and aortic atherosclerosis. Abnormal patchy density in the mid and lower lungs bilaterally that could represent pneumonia, aspiration or early edema. No effusions. No acute bone finding. IMPRESSION: Patchy density in the mid and lower lungs bilaterally that could represent pneumonia, aspiration or early edema. Electronically Signed   By: Nelson Chimes M.D.   On: 02/19/2017 09:04

## 2017-02-19 NOTE — ED Notes (Signed)
Pt trying to call for a ride home. No one answering at this time. Pt will continue to try

## 2017-02-19 NOTE — ED Notes (Signed)
Pt trying to call for a ride home. Pt has been unable to reach anyone. Taxi company not answering at this time.

## 2017-02-19 NOTE — ED Triage Notes (Signed)
Pt to ED via [facility] from home for nose bleed x several hours. Pt is dialysis pt, last treatment was yesterday. Pt is on blood thinners. Per EMS pt was given a blood transfusion last week. Pt in NAD at this time.

## 2017-02-19 NOTE — ED Notes (Signed)
Pt given sandwich tray and coffee.  

## 2017-02-19 NOTE — ED Provider Notes (Signed)
[name] Emergency Department Provider Note  ____________________________________________   First MD Initiated Contact with Patient 02/19/17 [phone]     (approximate)  I have reviewed the triage vital signs and the nursing notes.   HISTORY  Chief Complaint Epistaxis   HPI Stanley [name] is a 56 y.o. male is brought to the emergency department via EMS for a brief episode of epistaxis this morning.  According to EMS the patient's home health care nurse called 911 because he was bleeding from his nose.  They held pressure for 3 minutes and the bleeding stopped.  According to the patient he actually came to the emergency department today because he has had cough for 1 year and "brings up blood".  He has a past medical history of end-stage renal disease and last received hemodialysis yesterday.  He also has a history of reactive airway disease, cirrhosis, chronic anemia, and coronary artery disease.  He says his symptoms have been intermittent for the past year.  Nothing in particular seems to make them come or go.  He is currently in no pain.  He denies chest pain or shortness of breath.    Past Medical History:  Diagnosis Date  . Alcohol abuse   . CHF (congestive heart failure) ([place])   . Cirrhosis ([place])   . Coronary artery disease 2009  . Diabetic peripheral neuropathy ([place])   . Drug abuse ([place])   . End stage renal disease on dialysis [facility]) NEPHROLOGIST-   DR [facility]  IN [place]   HEMODIALYSIS --   TUES/  THURS/  SAT  . GERD (gastroesophageal reflux disease)   . Hyperlipidemia   . Hypertension   . PAD (peripheral artery disease) ([place])   . Renal insufficiency    Per pt, 32 oz fluid restriction per day  . S/P triple vessel bypass 06/09/2016   2009ish  . Suicidal ideation    & HOMICIDAL IDEATION --  06-16-2013   ADMITTED TO BEHAVIOR HEALTH    Patient Active Problem List   Diagnosis Date Noted  . Shortness of breath 11/26/2016  . COPD (chronic  obstructive pulmonary disease) ([place]) 10/30/2016  . COPD exacerbation ([place]) 10/29/2016  . Anemia   . Heme positive stool   . Ulceration of intestine   . Benign neoplasm of transverse colon   . Acute gastrointestinal hemorrhage   . Esophageal candidiasis ([place])   . Angiodysplasia of intestinal tract   . Acute respiratory failure with hypoxia ([place]) 07/03/2016  . GI bleeding 06/24/2016  . Rectal bleeding 06/14/2016  . Anemia of chronic disease 06/01/2016  . MRSA carrier 06/01/2016  . Chronic renal failure 05/23/2016  . Ischemic heart disease 05/23/2016  . Angiodysplasia of small intestine   . Melena   . Small bowel bleed not requiring more than 4 units of blood in 24 hours, ICU, or surgery   . Anemia due to chronic blood loss   . Abdominal pain 05/05/2016  . Acute posthemorrhagic anemia 04/17/2016  . Gastrointestinal bleed 04/17/2016  . History of esophagogastroduodenoscopy (EGD) 04/17/2016  . Elevated troponin 04/17/2016  . Alcohol abuse 04/17/2016  . Upper GI bleed 01/19/2016  . Blood in stool   . Angiodysplasia of stomach and duodenum with hemorrhage   . Gastritis   . Esophagitis, unspecified   . GI bleed 05/16/2015  . Acute GI bleeding   . Symptomatic anemia 04/30/2015  . HTN (hypertension) 04/06/2015  . GERD (gastroesophageal reflux disease) 04/06/2015  . HLD (hyperlipidemia) 04/06/2015  . Dyspnea  04/06/2015  . Cirrhosis of liver with ascites ([place]) 04/06/2015  . Ascites 04/06/2015  . GIB (gastrointestinal bleeding) 03/23/2015  . Homicidal ideation 06/19/2013  . Suicidal intent 06/19/2013  . Homicidal ideations 06/19/2013  . Hyperkalemia 06/16/2013  . Mandible fracture ([place]) 06/05/2013  . Fracture, mandible ([place]) 06/02/2013  . Coronary atherosclerosis of native coronary artery 06/02/2013  . ESRD on dialysis ([place]) 06/02/2013  . Mandible open fracture ([place]) 06/02/2013    Past Surgical History:  Procedure Laterality Date  . AGILE CAPSULE N/A 06/19/2016   Procedure:  AGILE CAPSULE;  Surgeon: [name], MD;  Location: [facility] ENDOSCOPY;  Service: Endoscopy;  Laterality: N/A;  . COLONOSCOPY WITH PROPOFOL N/A 06/18/2016   Procedure: COLONOSCOPY WITH PROPOFOL;  Surgeon: [name], MD;  Location: [facility] ENDOSCOPY;  Service: Endoscopy;  Laterality: N/A;  . COLONOSCOPY WITH PROPOFOL N/A 08/12/2016   Procedure: COLONOSCOPY WITH PROPOFOL;  Surgeon: [name], MD;  Location: [facility] ENDOSCOPY;  Service: Endoscopy;  Laterality: N/A;  . CORONARY ANGIOPLASTY  ?   PT UNABLE TO TELL IF  BEFORE OR AFTER  CABG  . CORONARY ARTERY BYPASS GRAFT  2008  ([place] , [place])   3 VESSEL  . DIALYSIS FISTULA CREATION  LAST SURGERY  APPOX  2008  . ENTEROSCOPY N/A 05/10/2016   Procedure: ENTEROSCOPY;  Surgeon: Jerene Bears, MD;  Location: [place];  Service: Gastroenterology;  Laterality: N/A;  . ENTEROSCOPY N/A 08/12/2016   Procedure: ENTEROSCOPY;  Surgeon: [name], MD;  Location: [facility] ENDOSCOPY;  Service: Endoscopy;  Laterality: N/A;  . ESOPHAGOGASTRODUODENOSCOPY N/A 05/07/2015   Procedure: ESOPHAGOGASTRODUODENOSCOPY (EGD);  Surgeon: Hulen Luster, MD;  Location: [facility] ENDOSCOPY;  Service: Endoscopy;  Laterality: N/A;  . ESOPHAGOGASTRODUODENOSCOPY (EGD) WITH PROPOFOL N/A 05/17/2015   Procedure: ESOPHAGOGASTRODUODENOSCOPY (EGD) WITH PROPOFOL;  Surgeon: [name], MD;  Location: [facility] ENDOSCOPY;  Service: Endoscopy;  Laterality: N/A;  . ESOPHAGOGASTRODUODENOSCOPY (EGD) WITH PROPOFOL N/A 01/20/2016   Procedure: ESOPHAGOGASTRODUODENOSCOPY (EGD) WITH PROPOFOL;  Surgeon: [name], MD;  Location: [facility] ENDOSCOPY;  Service: Endoscopy;  Laterality: N/A;  . ESOPHAGOGASTRODUODENOSCOPY (EGD) WITH PROPOFOL N/A 04/17/2016   Procedure: ESOPHAGOGASTRODUODENOSCOPY (EGD) WITH PROPOFOL;  Surgeon: Lin Landsman, MD;  Location: [facility] ENDOSCOPY;  Service: Gastroenterology;  Laterality: N/A;  . ESOPHAGOGASTRODUODENOSCOPY (EGD) WITH PROPOFOL  05/09/2016   Procedure: ESOPHAGOGASTRODUODENOSCOPY (EGD) WITH PROPOFOL;   Surgeon: Jerene Bears, MD;  Location: [place];  Service: Endoscopy;;  . ESOPHAGOGASTRODUODENOSCOPY (EGD) WITH PROPOFOL N/A 06/16/2016   Procedure: ESOPHAGOGASTRODUODENOSCOPY (EGD) WITH PROPOFOL;  Surgeon: [name], MD;  Location: [facility] ENDOSCOPY;  Service: Endoscopy;  Laterality: N/A;  . GIVENS CAPSULE STUDY N/A 05/07/2016   Procedure: GIVENS CAPSULE STUDY;  Surgeon: Doran Stabler, MD;  Location: [place];  Service: Endoscopy;  Laterality: N/A;  . MANDIBULAR HARDWARE REMOVAL N/A 07/29/2013   Procedure: REMOVAL OF ARCH BARS;  Surgeon: Theodoro Kos, DO;  Location: [place];  Service: Plastics;  Laterality: N/A;  . ORIF MANDIBULAR FRACTURE N/A 06/05/2013   Procedure: REPAIR OF MANDIBULAR FRACTURE x 2 with maxillo-mandibular fixation ;  Surgeon: Theodoro Kos, DO;  Location: [place];  Service: Plastics;  Laterality: N/A;  . PERIPHERAL ARTERIAL STENT GRAFT Left     Prior to Admission medications   Medication Sig Start Date End Date Taking? Authorizing Provider  aspirin 81 MG EC tablet Take 81 mg by mouth daily. 11/17/16  Yes [provider]  atorvastatin (LIPITOR) 40 MG tablet Take 1 tablet (40 mg total) by mouth daily. 11/16/16  Yes Vaughan Basta, MD  budesonide-formoterol (SYMBICORT) 160-4.5 MCG/ACT inhaler Inhale 2 puffs into the lungs daily. 11/16/16  Yes Vaughan Basta, MD  ferrous sulfate 325 (65 FE) MG tablet Take 1 tablet by mouth 3 (three) times daily. 01/21/17  Yes [provider]  furosemide (LASIX) 80 MG tablet Take 1 tablet (80 mg total) daily by mouth. 01/25/17  Yes Demetrios Loll, MD  gabapentin (NEURONTIN) 300 MG capsule TAKE 1 CAPSULE BY MOUTH EVERY EVENING AND 1 EXTRA CAP ON DIALYSIS DAYS AS NEEDED FOR NERVE PAIN 08/16/16  Yes [provider]  labetalol (NORMODYNE) 100 MG tablet Take 1 tablet (100 mg total) by mouth daily. 11/16/16  Yes Vaughan Basta, MD  lactulose (CHRONULAC) 10 GM/15ML solution Take 30 mLs (20 g  total) by mouth 3 (three) times daily. 01/11/17  Yes Mody, Ulice Bold, MD  Multiple Vitamins-Minerals-FA (DIALYVITE SUPREME D) 3 MG TABS Take 1 tablet (3 mg total) by mouth daily. 11/28/16  Yes Wieting, Richard, MD  pantoprazole (PROTONIX) 40 MG tablet Take 1 tablet (40 mg total) by mouth daily. 11/28/16  Yes Wieting, Richard, MD  tiotropium (SPIRIVA HANDIHALER) 18 MCG inhalation capsule Place 1 capsule (18 mcg total) into inhaler and inhale daily. 11/16/16 11/16/17 Yes Vaughan Basta, MD  albuterol (PROVENTIL HFA;VENTOLIN HFA) 108 (90 Base) MCG/ACT inhaler Inhale 4-6 puffs by mouth every 4 hours as needed for wheezing, cough, and/or shortness of breath 11/16/16   Vaughan Basta, MD  albuterol (PROVENTIL HFA;VENTOLIN HFA) 108 (90 Base) MCG/ACT inhaler Inhale 2 puffs into the lungs every 6 (six) hours as needed for wheezing or shortness of breath. 02/19/17   Darel Hong, MD  alum & mag hydroxide-simeth (MAALOX/MYLANTA) 200-200-20 MG/5ML suspension Take 15 mLs every 4 (four) hours as needed by mouth for indigestion or heartburn. 01/25/17   Demetrios Loll, MD  ipratropium-albuterol (DUONEB) 0.5-2.5 (3) MG/3ML SOLN Take 3 mLs by nebulization every 6 (six) hours as needed. 11/28/16   Loletha Grayer, MD  levofloxacin (LEVAQUIN) 750 MG tablet Take 1 tablet (750 mg total) by mouth daily for 6 days. 02/19/17 02/25/17  Darel Hong, MD  nicotine (NICODERM CQ - DOSED IN MG/24 HOURS) 21 mg/24hr patch Place 1 patch (21 mg total) onto the skin daily. 01/12/17   Bettey Costa, MD  nitroGLYCERIN (NITROSTAT) 0.4 MG SL tablet Place 1 tablet (0.4 mg total) under the tongue every 5 (five) minutes as needed. 04/12/16   Wende Bushy, MD  sevelamer carbonate (RENVELA) 800 MG tablet Take 3 tablets (2,400 mg total) by mouth 3 (three) times daily with meals. Patient not taking: Reported on 01/22/2017 11/16/16   Vaughan Basta, MD  Spacer/Aero Chamber Mouthpiece MISC 1 Units by Does not apply route every 4 (four) hours as  needed (wheezing). 02/19/17   Darel Hong, MD    Allergies Patient has no known allergies.  Family History  Problem Relation Age of Onset  . Colon cancer Mother   . Cancer Father   . Cancer Sister   . Kidney disease Brother     Social History Social History   Tobacco Use  . Smoking status: Current Every Day Smoker    Packs/day: 0.15    Years: 40.00    Pack years: 6.00    Types: Cigarettes  . Smokeless tobacco: Never Used  Substance Use Topics  . Alcohol use: No    Comment: pt reports quitting after learning about cirrhosis  . Drug use: No    Review of Systems Constitutional: No fever/chills ENT: No sore throat. Cardiovascular: Denies chest pain. Respiratory: Denies shortness  of breath. Gastrointestinal: No abdominal pain.  No nausea, no vomiting.  No diarrhea.  No constipation. Musculoskeletal: Negative for back pain. Neurological: Negative for headaches   ____________________________________________   PHYSICAL EXAM:  VITAL SIGNS: ED Triage Vitals [02/19/17 0835]  Enc Vitals Group     BP      Pulse      Resp      Temp      Temp src      SpO2      Weight      Height      Head Circumference      Peak Flow      Pain Score 8     Pain Loc      Pain Edu?      Excl. in [place]?     Constitutional: Alert and oriented x4 intermittently cooperative no respiratory distress Head: Atraumatic. Nose: No congestion/rhinnorhea. Mouth/Throat: No trismus Right nare normal left nare with area  not actively bleeding but clearly recently did bleed No blood in his oropharynx Neck: No stridor.   Cardiovascular: Regular rate and rhythm Respiratory: Normal respiratory effort.  No retractions.  Faint expiratory wheeze in all fields but moving good air Gastrointestinal: Obese soft nontender Neurologic:  Normal speech and language. No gross focal neurologic deficits are appreciated.  Skin:  Skin is warm, dry and intact. No rash  noted.    ____________________________________________  LABS (all labs ordered are listed, but only abnormal results are displayed)  Labs Reviewed - No data to display   __________________________________________  EKG  ED ECG REPORT I, Darel Hong, the attending physician, personally viewed and interpreted this ECG.  Date: 02/19/2017 EKG Time:  Rate: 81 Rhythm: normal sinus rhythm QRS Axis: Rightward axis Intervals: Prolonged QTC ST/T Wave abnormalities: normal Narrative Interpretation: no evidence of acute ischemia  ____________________________________________  RADIOLOGY  Chest x-ray reviewed by me concerning for right middle lobe pneumonia ____________________________________________   DIFFERENTIAL includes but not limited to  Hyperkalemia, anterior epistaxis, posterior epistaxis, COPD exacerbation   PROCEDURES  Procedure(s) performed: no  Procedures  Critical Care performed: no  Observation: no ____________________________________________   INITIAL IMPRESSION / ASSESSMENT AND PLAN / ED COURSE  Pertinent labs & imaging results that were available during my care of the patient were reviewed by me and considered in my medical decision making (see chart for details).  Patient arrives hemodynamically stable and very well-appearing.  He is saturating 97% on room air with normal work of breathing.  He does have some wheezing throughout with a reported history of COPD, although on chart review I do not appreciate any known history.  His nosebleed is already stopped.  I will give him 1 DuoNeb and a chest x-ray and reevaluate.  The patient's chest x-ray is being read as possible early pneumonia which would fit his clinical picture of a faint hemoptysis and cough.  He is afebrile and saturating 97% on room air.  His lungs are now clear after breathing treatment.  I do appreciate that he had a recent hospitalization is he is a dialysis patient, however given his  clinical status I think he is stable for outpatient management.  I discussed with the patient and he verbalized understanding and agreement with the plan and he would prefer to go home.  Strict return precautions have been given and the patient is discharged home in improved condition.      ____________________________________________   FINAL CLINICAL IMPRESSION(S) / ED DIAGNOSES  Final diagnoses:  Anterior epistaxis  Community acquired pneumonia of right middle lobe of lung ([place])      NEW MEDICATIONS STARTED DURING THIS VISIT:  This SmartLink is deprecated. Use AVSMEDLIST instead to display the medication list for a patient.   Note:  This document was prepared using Dragon voice recognition software and may include unintentional dictation errors.      Darel Hong, MD 02/19/17 [phone]

## 2017-02-21 NOTE — Discharge Instructions (Signed)
Paracentesis, Care After °Refer to this sheet in the next few weeks. These instructions provide you with information about caring for yourself after your procedure. Your health care provider may also give you more specific instructions. Your treatment has been planned according to current medical practices, but problems sometimes occur. Call your health care provider if you have any problems or questions after your procedure. °What can I expect after the procedure? °After your procedure, it is common to have a small amount of clear fluid coming from the puncture site. °Follow these instructions at home: °· Return to your normal activities as told by your health care provider. Ask your health care provider what activities are safe for you. °· Take over-the-counter and prescription medicines only as told by your health care provider. °· Do not take baths, swim, or use a hot tub until your health care provider approves. °· Follow instructions from your health care provider about: °? How to take care of your puncture site. °? When and how you should change your bandage (dressing). °? When you should remove your dressing. °· Check your puncture area every day signs of infection. Watch for: °? Redness, swelling, or pain. °? Fluid, blood, or pus. °· Keep all follow-up visits as told by your health care provider. This is important. °Contact a health care provider if: °· You have redness, swelling, or pain at your puncture site. °· You start to have more clear fluid coming from your puncture site. °· You have blood or pus coming from your puncture site. °· You have chills. °· You have a fever. °Get help right away if: °· You develop chest pain or shortness of breath. °· You develop increasing pain, discomfort, or swelling in your abdomen. °· You feel dizzy or light-headed or you pass out. °This information is not intended to replace advice given to you by your health care provider. Make sure you discuss any questions you  have with your health care provider. °Document Released: 07/15/2014 Document Revised: 08/06/2015 Document Reviewed: 05/13/2014 °Elsevier Interactive Patient Education © 2018 [employer] ° °

## 2017-02-22 ENCOUNTER — Ambulatory Visit
Admission: RE | Admit: 2017-02-22 | Discharge: 2017-02-22 | Disposition: A | Discharge status: Home / Self Care | Payer: Medicare Other | Source: Ambulatory Visit | Attending: Nephrology | Admitting: Nephrology

## 2017-02-22 ENCOUNTER — Encounter: Payer: Self-pay | Admitting: Intensive Care

## 2017-02-22 ENCOUNTER — Other Ambulatory Visit: Payer: Self-pay

## 2017-02-22 ENCOUNTER — Inpatient Hospital Stay
Admission: EM | Admit: 2017-02-22 | Discharge: 2017-02-24 | DRG: 640 | Discharge status: Against Medical Advice | Payer: Medicare Other | Attending: Internal Medicine | Admitting: Internal Medicine

## 2017-02-22 ENCOUNTER — Emergency Department: Payer: Medicare Other

## 2017-02-22 DIAGNOSIS — Z9115 Patient's noncompliance with renal dialysis: Secondary | ICD-10-CM | POA: Diagnosis not present

## 2017-02-22 DIAGNOSIS — Z992 Dependence on renal dialysis: Secondary | ICD-10-CM

## 2017-02-22 DIAGNOSIS — Z7951 Long term (current) use of inhaled steroids: Secondary | ICD-10-CM | POA: Diagnosis not present

## 2017-02-22 DIAGNOSIS — R188 Other ascites: Secondary | ICD-10-CM

## 2017-02-22 DIAGNOSIS — K219 Gastro-esophageal reflux disease without esophagitis: Secondary | ICD-10-CM | POA: Diagnosis present

## 2017-02-22 DIAGNOSIS — N2581 Secondary hyperparathyroidism of renal origin: Secondary | ICD-10-CM | POA: Diagnosis present

## 2017-02-22 DIAGNOSIS — D631 Anemia in chronic kidney disease: Secondary | ICD-10-CM | POA: Diagnosis present

## 2017-02-22 DIAGNOSIS — Z79899 Other long term (current) drug therapy: Secondary | ICD-10-CM

## 2017-02-22 DIAGNOSIS — E875 Hyperkalemia: Secondary | ICD-10-CM | POA: Diagnosis present

## 2017-02-22 DIAGNOSIS — I132 Hypertensive heart and chronic kidney disease with heart failure and with stage 5 chronic kidney disease, or end stage renal disease: Secondary | ICD-10-CM | POA: Diagnosis present

## 2017-02-22 DIAGNOSIS — E1151 Type 2 diabetes mellitus with diabetic peripheral angiopathy without gangrene: Secondary | ICD-10-CM | POA: Diagnosis present

## 2017-02-22 DIAGNOSIS — I2510 Atherosclerotic heart disease of native coronary artery without angina pectoris: Secondary | ICD-10-CM | POA: Diagnosis present

## 2017-02-22 DIAGNOSIS — F17210 Nicotine dependence, cigarettes, uncomplicated: Secondary | ICD-10-CM | POA: Diagnosis present

## 2017-02-22 DIAGNOSIS — K7460 Unspecified cirrhosis of liver: Secondary | ICD-10-CM | POA: Diagnosis present

## 2017-02-22 DIAGNOSIS — R531 Weakness: Secondary | ICD-10-CM | POA: Diagnosis present

## 2017-02-22 DIAGNOSIS — I5032 Chronic diastolic (congestive) heart failure: Secondary | ICD-10-CM | POA: Diagnosis present

## 2017-02-22 DIAGNOSIS — K7290 Hepatic failure, unspecified without coma: Secondary | ICD-10-CM | POA: Diagnosis present

## 2017-02-22 DIAGNOSIS — Z951 Presence of aortocoronary bypass graft: Secondary | ICD-10-CM

## 2017-02-22 DIAGNOSIS — E785 Hyperlipidemia, unspecified: Secondary | ICD-10-CM | POA: Diagnosis present

## 2017-02-22 DIAGNOSIS — D500 Iron deficiency anemia secondary to blood loss (chronic): Secondary | ICD-10-CM | POA: Diagnosis present

## 2017-02-22 DIAGNOSIS — Z9119 Patient's noncompliance with other medical treatment and regimen: Secondary | ICD-10-CM

## 2017-02-22 DIAGNOSIS — J810 Acute pulmonary edema: Secondary | ICD-10-CM | POA: Diagnosis not present

## 2017-02-22 DIAGNOSIS — J449 Chronic obstructive pulmonary disease, unspecified: Secondary | ICD-10-CM | POA: Diagnosis present

## 2017-02-22 DIAGNOSIS — N186 End stage renal disease: Secondary | ICD-10-CM | POA: Diagnosis present

## 2017-02-22 DIAGNOSIS — E1122 Type 2 diabetes mellitus with diabetic chronic kidney disease: Secondary | ICD-10-CM | POA: Diagnosis present

## 2017-02-22 DIAGNOSIS — F17200 Nicotine dependence, unspecified, uncomplicated: Secondary | ICD-10-CM | POA: Diagnosis not present

## 2017-02-22 LAB — CBC
HEMATOCRIT: 25 % — AB (ref 40.0–52.0)
HEMOGLOBIN: 7.6 g/dL — AB (ref 13.0–18.0)
MCH: 27 pg (ref 26.0–34.0)
MCHC: 30.4 g/dL — ABNORMAL LOW (ref 32.0–36.0)
MCV: 88.7 fL (ref 80.0–100.0)
Platelets: 196 10*3/uL (ref 150–440)
RBC: 2.82 MIL/uL — AB (ref 4.40–5.90)
RDW: 21.3 % — ABNORMAL HIGH (ref 11.5–14.5)
WBC: 4.7 10*3/uL (ref 3.8–10.6)

## 2017-02-22 LAB — COMPREHENSIVE METABOLIC PANEL
ALBUMIN: 11.4 g/dL — AB (ref 3.5–5.0)
ALT: 12 U/L — AB (ref 17–63)
AST: 18 U/L (ref 15–41)
Alkaline Phosphatase: 103 U/L (ref 38–126)
Anion gap: 36 — ABNORMAL HIGH (ref 5–15)
BILIRUBIN TOTAL: 0.5 mg/dL (ref 0.3–1.2)
BUN: 41 mg/dL — ABNORMAL HIGH (ref 6–20)
CO2: 17 mmol/L — AB (ref 22–32)
Calcium: 4.9 mg/dL — CL (ref 8.9–10.3)
Chloride: 84 mmol/L — ABNORMAL LOW (ref 101–111)
Creatinine, Ser: 6.2 mg/dL — ABNORMAL HIGH (ref 0.61–1.24)
GFR calc non Af Amer: 9 mL/min — ABNORMAL LOW (ref >60)
GFR, EST AFRICAN AMERICAN: 10 mL/min — AB (ref >60)
GLUCOSE: 84 mg/dL (ref 65–99)
Potassium: 5.2 mmol/L — ABNORMAL HIGH (ref 3.5–5.1)
SODIUM: 137 mmol/L (ref 135–145)

## 2017-02-22 LAB — PHOSPHORUS
Phosphorus: 2.1 mg/dL — ABNORMAL LOW (ref 2.5–4.6)
Phosphorus: 4.3 mg/dL (ref 2.5–4.6)

## 2017-02-22 LAB — LACTIC ACID, PLASMA
LACTIC ACID, VENOUS: 1.4 mmol/L (ref 0.5–1.9)
LACTIC ACID, VENOUS: 1.6 mmol/L (ref 0.5–1.9)

## 2017-02-22 LAB — GLUCOSE, CAPILLARY: Glucose-Capillary: 107 mg/dL — ABNORMAL HIGH (ref 65–99)

## 2017-02-22 LAB — TROPONIN I: Troponin I: <0.03 ng/mL (ref <0.03)

## 2017-02-22 LAB — CALCIUM: Calcium: 8.2 mg/dL — ABNORMAL LOW (ref 8.9–10.3)

## 2017-02-22 LAB — MAGNESIUM: MAGNESIUM: 3.1 mg/dL — AB (ref 1.7–2.4)

## 2017-02-22 MED ORDER — SODIUM CHLORIDE 0.9 % IV SOLN
100.0000 mL | INTRAVENOUS | Status: DC | PRN
Start: 2017-02-22 — End: 2017-02-23

## 2017-02-22 MED ORDER — ACETAMINOPHEN 650 MG RE SUPP: 650.0000 mg | Freq: Four times a day (QID) | RECTAL | Status: DC | PRN

## 2017-02-22 MED ORDER — PANTOPRAZOLE SODIUM 40 MG PO TBEC
40.0000 mg | DELAYED_RELEASE_TABLET | Freq: Every day | ORAL | Status: DC
  Administered 2017-02-23 – 2017-02-24 (×2): 40 mg via ORAL
  Filled 2017-02-22 (×2): qty 1

## 2017-02-22 MED ORDER — ALBUTEROL SULFATE (2.5 MG/3ML) 0.083% IN NEBU: 2.5000 mg | INHALATION_SOLUTION | Freq: Four times a day (QID) | RESPIRATORY_TRACT | Status: DC | PRN

## 2017-02-22 MED ORDER — LACTULOSE 10 GM/15ML PO SOLN
20.0000 g | Freq: Three times a day (TID) | ORAL | Status: DC
  Administered 2017-02-22 – 2017-02-24 (×5): 20 g via ORAL
  Filled 2017-02-22 (×5): qty 30

## 2017-02-22 MED ORDER — SPACER/AERO CHAMBER MOUTHPIECE MISC: 1.0000 [IU] | Status: DC | PRN

## 2017-02-22 MED ORDER — SODIUM CHLORIDE 0.9 % IV SOLN
1.0000 g | Freq: Once | INTRAVENOUS | Status: AC
  Administered 2017-02-22: 1 g via INTRAVENOUS
  Filled 2017-02-22: qty 10

## 2017-02-22 MED ORDER — ACETAMINOPHEN 325 MG PO TABS: 650.0000 mg | ORAL_TABLET | Freq: Four times a day (QID) | ORAL | Status: DC | PRN

## 2017-02-22 MED ORDER — HEPARIN SODIUM (PORCINE) 1000 UNIT/ML DIALYSIS
1000.0000 [IU] | INTRAMUSCULAR | Status: DC | PRN
  Filled 2017-02-22: qty 1

## 2017-02-22 MED ORDER — ONDANSETRON HCL 4 MG/2ML IJ SOLN: 4.0000 mg | Freq: Four times a day (QID) | INTRAMUSCULAR | Status: DC | PRN

## 2017-02-22 MED ORDER — SEVELAMER CARBONATE 800 MG PO TABS
2400.0000 mg | ORAL_TABLET | Freq: Three times a day (TID) | ORAL | Status: DC
  Administered 2017-02-23 – 2017-02-24 (×2): 2400 mg via ORAL
  Filled 2017-02-22 (×2): qty 3

## 2017-02-22 MED ORDER — NEPHRO-VITE 0.8 MG PO TABS
1.0000 | ORAL_TABLET | Freq: Every day | ORAL | Status: DC
  Administered 2017-02-24: 1 via ORAL
  Filled 2017-02-22 (×2): qty 1

## 2017-02-22 MED ORDER — SODIUM CHLORIDE 0.9 % IV SOLN: 250.0000 mL | INTRAVENOUS | Status: DC | PRN

## 2017-02-22 MED ORDER — ALBUMIN HUMAN 25 % IV SOLN
25.0000 g | Freq: Once | INTRAVENOUS | Status: DC
  Filled 2017-02-22: qty 100

## 2017-02-22 MED ORDER — NICOTINE 21 MG/24HR TD PT24
21.0000 mg | MEDICATED_PATCH | Freq: Every day | TRANSDERMAL | Status: DC
  Administered 2017-02-22 – 2017-02-24 (×3): 21 mg via TRANSDERMAL
  Filled 2017-02-22 (×3): qty 1

## 2017-02-22 MED ORDER — GABAPENTIN 300 MG PO CAPS: 300.0000 mg | ORAL_CAPSULE | Freq: Every day | ORAL | Status: DC | PRN

## 2017-02-22 MED ORDER — DEXTROSE 50 % IV SOLN
1.0000 | Freq: Once | INTRAVENOUS | Status: AC
  Administered 2017-02-22: 50 mL via INTRAVENOUS
  Filled 2017-02-22: qty 50

## 2017-02-22 MED ORDER — ALBUMIN HUMAN 25 % IV SOLN
INTRAVENOUS | Status: AC
  Administered 2017-02-22: 13:00:00
  Filled 2017-02-22: qty 100

## 2017-02-22 MED ORDER — SODIUM CHLORIDE 0.9 % IV SOLN: 100.0000 mL | INTRAVENOUS | Status: DC | PRN

## 2017-02-22 MED ORDER — SODIUM CHLORIDE 0.9% FLUSH
3.0000 mL | Freq: Two times a day (BID) | INTRAVENOUS | Status: DC
  Administered 2017-02-22 – 2017-02-24 (×3): 3 mL via INTRAVENOUS

## 2017-02-22 MED ORDER — FUROSEMIDE 40 MG PO TABS
80.0000 mg | ORAL_TABLET | Freq: Every day | ORAL | Status: DC
  Administered 2017-02-23 – 2017-02-24 (×2): 80 mg via ORAL
  Filled 2017-02-22 (×2): qty 2

## 2017-02-22 MED ORDER — LABETALOL HCL 100 MG PO TABS
100.0000 mg | ORAL_TABLET | Freq: Every day | ORAL | Status: DC
  Administered 2017-02-24: 100 mg via ORAL
  Filled 2017-02-22 (×2): qty 1

## 2017-02-22 MED ORDER — HEPARIN SODIUM (PORCINE) 5000 UNIT/ML IJ SOLN
5000.0000 [IU] | Freq: Three times a day (TID) | INTRAMUSCULAR | Status: DC
  Administered 2017-02-22 – 2017-02-24 (×4): 5000 [IU] via SUBCUTANEOUS
  Filled 2017-02-22 (×4): qty 1

## 2017-02-22 MED ORDER — LEVOFLOXACIN 500 MG PO TABS
500.0000 mg | ORAL_TABLET | ORAL | Status: DC
  Administered 2017-02-24: 500 mg via ORAL
  Filled 2017-02-22: qty 1

## 2017-02-22 MED ORDER — LEVOFLOXACIN 750 MG PO TABS: 750.0000 mg | ORAL_TABLET | Freq: Every day | ORAL | Status: DC

## 2017-02-22 MED ORDER — ATORVASTATIN CALCIUM 20 MG PO TABS
40.0000 mg | ORAL_TABLET | Freq: Every day | ORAL | Status: DC
  Administered 2017-02-22 – 2017-02-24 (×3): 40 mg via ORAL
  Filled 2017-02-22 (×3): qty 2

## 2017-02-22 MED ORDER — ALBUTEROL SULFATE (2.5 MG/3ML) 0.083% IN NEBU
2.5000 mg | INHALATION_SOLUTION | Freq: Once | RESPIRATORY_TRACT | Status: AC
  Administered 2017-02-22: 2.5 mg via RESPIRATORY_TRACT
  Filled 2017-02-22: qty 3

## 2017-02-22 MED ORDER — ONDANSETRON HCL 4 MG PO TABS: 4.0000 mg | ORAL_TABLET | Freq: Four times a day (QID) | ORAL | Status: DC | PRN

## 2017-02-22 MED ORDER — SODIUM CHLORIDE 0.9% FLUSH: 3.0000 mL | INTRAVENOUS | Status: DC | PRN

## 2017-02-22 MED ORDER — ALBUMIN HUMAN 25 % IV SOLN
INTRAVENOUS | Status: AC
  Filled 2017-02-22: qty 100

## 2017-02-22 MED ORDER — LEVOFLOXACIN 500 MG PO TABS: 250.0000 mg | ORAL_TABLET | ORAL | Status: DC

## 2017-02-22 MED ORDER — ALBUMIN HUMAN 25 % IV SOLN
12.5000 g | Freq: Once | INTRAVENOUS | Status: DC
  Filled 2017-02-22: qty 50

## 2017-02-22 MED ORDER — PENTAFLUOROPROP-TETRAFLUOROETH EX AERO
1.0000 "application " | INHALATION_SPRAY | CUTANEOUS | Status: DC | PRN
  Filled 2017-02-22: qty 30

## 2017-02-22 MED ORDER — ALUM & MAG HYDROXIDE-SIMETH 200-200-20 MG/5ML PO SUSP
15.0000 mL | ORAL | Status: DC | PRN
  Administered 2017-02-23: 15 mL via ORAL
  Filled 2017-02-22: qty 30

## 2017-02-22 MED ORDER — LIDOCAINE HCL (PF) 1 % IJ SOLN
5.0000 mL | INTRAMUSCULAR | Status: DC | PRN
  Filled 2017-02-22: qty 5

## 2017-02-22 MED ORDER — ALTEPLASE 2 MG IJ SOLR: 2.0000 mg | Freq: Once | INTRAMUSCULAR | Status: DC | PRN

## 2017-02-22 MED ORDER — LIDOCAINE-PRILOCAINE 2.5-2.5 % EX CREA
1.0000 "application " | TOPICAL_CREAM | CUTANEOUS | Status: DC | PRN
  Filled 2017-02-22: qty 5

## 2017-02-22 MED ORDER — MOMETASONE FURO-FORMOTEROL FUM 200-5 MCG/ACT IN AERO
2.0000 | INHALATION_SPRAY | Freq: Two times a day (BID) | RESPIRATORY_TRACT | Status: DC
  Administered 2017-02-22: 2 via RESPIRATORY_TRACT
  Filled 2017-02-22: qty 8.8

## 2017-02-22 MED ORDER — TIOTROPIUM BROMIDE MONOHYDRATE 18 MCG IN CAPS
18.0000 ug | ORAL_CAPSULE | Freq: Every day | RESPIRATORY_TRACT | Status: DC
  Administered 2017-02-22 – 2017-02-24 (×3): 18 ug via RESPIRATORY_TRACT
  Filled 2017-02-22: qty 5

## 2017-02-22 MED ORDER — ASPIRIN EC 81 MG PO TBEC
81.0000 mg | DELAYED_RELEASE_TABLET | Freq: Every day | ORAL | Status: DC
  Administered 2017-02-23 – 2017-02-24 (×2): 81 mg via ORAL
  Filled 2017-02-22 (×2): qty 1

## 2017-02-22 MED ORDER — INSULIN ASPART 100 UNIT/ML ~~LOC~~ SOLN
10.0000 [IU] | Freq: Once | SUBCUTANEOUS | Status: AC
  Administered 2017-02-22: 10 [IU] via INTRAVENOUS
  Filled 2017-02-22: qty 1

## 2017-02-22 MED ORDER — IPRATROPIUM-ALBUTEROL 0.5-2.5 (3) MG/3ML IN SOLN: 3.0000 mL | Freq: Four times a day (QID) | RESPIRATORY_TRACT | Status: DC | PRN

## 2017-02-22 MED ORDER — NITROGLYCERIN 0.4 MG SL SUBL: 0.4000 mg | SUBLINGUAL_TABLET | SUBLINGUAL | Status: DC | PRN

## 2017-02-22 MED ORDER — FERROUS SULFATE 325 (65 FE) MG PO TABS
325.0000 mg | ORAL_TABLET | Freq: Three times a day (TID) | ORAL | Status: DC
  Administered 2017-02-22 – 2017-02-24 (×5): 325 mg via ORAL
  Filled 2017-02-22 (×5): qty 1

## 2017-02-22 NOTE — Care Management (Signed)
Estill Bamberg with [facility] (outpatient hemodialysis liaison) notified of patient presentation to ED.

## 2017-02-22 NOTE — ED Notes (Signed)
Second bottle of albumin started. Brought over by specials

## 2017-02-22 NOTE — Progress Notes (Addendum)
PHARMACY NOTE:  ANTIMICROBIAL RENAL DOSAGE ADJUSTMENT  Current antimicrobial regimen includes a mismatch between antimicrobial dosage and estimated renal function.  As per policy approved by the Pharmacy & Therapeutics and Medical Executive Committees, the antimicrobial dosage will be adjusted accordingly.  Current antimicrobial dosage:  Levaquin 750 mg PO Q24H   Indication: HCAP   Renal Function:  Estimated Creatinine Clearance: 14.8 mL/min (A) (by C-G formula based on SCr of 6.2 mg/dL (H)). [x]      On intermittent HD, scheduled: []      On CRRT    Antimicrobial dosage has been changed to:  Levaquin 500 mg PO Q48H to start on 12/14 @ 10:00.   Additional comments:   Thank you for allowing pharmacy to be a part of this patient's care.  Orene Desanctis, [facility] 02/22/2017 5:34 PM

## 2017-02-22 NOTE — H&P (Signed)
[place] [facility] [place] NAME: Stanley [name]    MR#:  [id number]  DATE OF BIRTH:  28-Jun-1960  DATE OF ADMISSION:  02/22/2017  PRIMARY CARE PHYSICIAN: Alene Mires Elyse Jarvis, MD    REQUESTING/REFERRING PHYSICIAN: Lavonia Drafts, MD  CHIEF COMPLAINT:   Chief Complaint  Patient presents with  . Weakness    HISTORY OF PRESENT ILLNESS: Stanley [name]  is a 56 y.o. male with a known history of medical noncompliance presenting with generalized weakness after his paracentesis.  Patient received IV albumin prior to the paracentesis and had his paracentesis.  Post procedure he started feeling very weak.  When he arrived in the ER his QRS and QT interval well are prolonged.  Patient's noted to have very low calcium.  He is currently awake and states that he just felt that his legs being weak but otherwise has not had any nausea, vomiting or shortness of breath. Patient is very noncompliant with his hemodialysis.  He only had an hour and a half hemodialysis on Saturday.  Previously he has not been noncompliant with hemodialysis and only has it intermittently.    PAST MEDICAL HISTORY:   Past Medical History:  Diagnosis Date  . Alcohol abuse   . CHF (congestive heart failure) ([place])   . Cirrhosis ([place])   . Coronary artery disease 2009  . Diabetic peripheral neuropathy ([place])   . Drug abuse ([place])   . End stage renal disease on dialysis [facility]) NEPHROLOGIST-   DR [facility]  IN    HEMODIALYSIS --   TUES/  THURS/  SAT  . GERD (gastroesophageal reflux disease)   . Hyperlipidemia   . Hypertension   . PAD (peripheral artery disease) ([place])   . Renal insufficiency    Per pt, 32 oz fluid restriction per day  . S/P triple vessel bypass 06/09/2016   2009ish  . Suicidal ideation    & HOMICIDAL IDEATION --  06-16-2013   ADMITTED TO BEHAVIOR HEALTH    PAST SURGICAL HISTORY:  Past Surgical History:  Procedure Laterality Date  . AGILE CAPSULE N/A 06/19/2016    Procedure: AGILE CAPSULE;  Surgeon: [name], MD;  Location[facility] ENDOSCOPY;  Service: Endoscopy;  Laterality: N/A;  . COLONOSCOPY WITH PROPOFOL N/A 06/18/2016   Procedure: COLONOSCOPY WITH PROPOFOL;  Surgeon: [name], MD;  Location: [facility] ENDOSCOPY;  Service: Endoscopy;  Laterality: N/A;  . COLONOSCOPY WITH PROPOFOL N/A 08/12/2016   Procedure: COLONOSCOPY WITH PROPOFOL;  Surgeon: [name], MD;  Location: [facility] ENDOSCOPY;  Service: Endoscopy;  Laterality: N/A;  . CORONARY ANGIOPLASTY  ?   PT UNABLE TO TELL IF  BEFORE OR AFTER  CABG  . CORONARY ARTERY BYPASS GRAFT  2008  ([place] , [place])   3 VESSEL  . DIALYSIS FISTULA CREATION  LAST SURGERY  APPOX  2008  . ENTEROSCOPY N/A 05/10/2016   Procedure: ENTEROSCOPY;  Surgeon: Jerene Bears, MD;  Location: [place];  Service: Gastroenterology;  Laterality: N/A;  . ENTEROSCOPY N/A 08/12/2016   Procedure: ENTEROSCOPY;  Surgeon: [name], MD;  Location: [facility] ENDOSCOPY;  Service: Endoscopy;  Laterality: N/A;  . ESOPHAGOGASTRODUODENOSCOPY N/A 05/07/2015   Procedure: ESOPHAGOGASTRODUODENOSCOPY (EGD);  Surgeon: Hulen Luster, MD;  Location: [facility] ENDOSCOPY;  Service: Endoscopy;  Laterality: N/A;  . ESOPHAGOGASTRODUODENOSCOPY (EGD) WITH PROPOFOL N/A 05/17/2015   Procedure: ESOPHAGOGASTRODUODENOSCOPY (EGD) WITH PROPOFOL;  Surgeon: [name], MD;  Location: [facility] ENDOSCOPY;  Service: Endoscopy;  Laterality: N/A;  . ESOPHAGOGASTRODUODENOSCOPY (EGD) WITH PROPOFOL N/A 01/20/2016  Procedure: ESOPHAGOGASTRODUODENOSCOPY (EGD) WITH PROPOFOL;  Surgeon: [name], MD;  Location: [facility] ENDOSCOPY;  Service: Endoscopy;  Laterality: N/A;  . ESOPHAGOGASTRODUODENOSCOPY (EGD) WITH PROPOFOL N/A 04/17/2016   Procedure: ESOPHAGOGASTRODUODENOSCOPY (EGD) WITH PROPOFOL;  Surgeon: Lin Landsman, MD;  Location: [facility] ENDOSCOPY;  Service: Gastroenterology;  Laterality: N/A;  . ESOPHAGOGASTRODUODENOSCOPY (EGD) WITH PROPOFOL  05/09/2016   Procedure: ESOPHAGOGASTRODUODENOSCOPY (EGD) WITH  PROPOFOL;  Surgeon: Jerene Bears, MD;  Location: [place];  Service: Endoscopy;;  . ESOPHAGOGASTRODUODENOSCOPY (EGD) WITH PROPOFOL N/A 06/16/2016   Procedure: ESOPHAGOGASTRODUODENOSCOPY (EGD) WITH PROPOFOL;  Surgeon: [name], MD;  Location: [facility] ENDOSCOPY;  Service: Endoscopy;  Laterality: N/A;  . GIVENS CAPSULE STUDY N/A 05/07/2016   Procedure: GIVENS CAPSULE STUDY;  Surgeon: Doran Stabler, MD;  Location: [place];  Service: Endoscopy;  Laterality: N/A;  . MANDIBULAR HARDWARE REMOVAL N/A 07/29/2013   Procedure: REMOVAL OF ARCH BARS;  Surgeon: Theodoro Kos, DO;  Location: [place];  Service: Plastics;  Laterality: N/A;  . ORIF MANDIBULAR FRACTURE N/A 06/05/2013   Procedure: REPAIR OF MANDIBULAR FRACTURE x 2 with maxillo-mandibular fixation ;  Surgeon: Theodoro Kos, DO;  Location: [place];  Service: Plastics;  Laterality: N/A;  . PERIPHERAL ARTERIAL STENT GRAFT Left     SOCIAL HISTORY:  Social History   Tobacco Use  . Smoking status: Current Every Day Smoker    Packs/day: 0.15    Years: 40.00    Pack years: 6.00    Types: Cigarettes  . Smokeless tobacco: Never Used  Substance Use Topics  . Alcohol use: No    Comment: pt reports quitting after learning about cirrhosis    FAMILY HISTORY:  Family History  Problem Relation Age of Onset  . Colon cancer Mother   . Cancer Father   . Cancer Sister   . Kidney disease Brother     DRUG ALLERGIES: No Known Allergies  REVIEW OF SYSTEMS:   CONSTITUTIONAL: No fever, positive fatigue and weakness.  EYES: No blurred or double vision.  EARS, NOSE, AND THROAT: No tinnitus or ear pain.  RESPIRATORY: No cough, shortness of breath, wheezing or hemoptysis.  CARDIOVASCULAR: No chest pain, orthopnea, edema.  GASTROINTESTINAL: No nausea, vomiting, diarrhea or abdominal pain.  GENITOURINARY: No dysuria, hematuria.  ENDOCRINE: No polyuria, nocturia,  HEMATOLOGY: No anemia, easy bruising or bleeding SKIN: No rash or  lesion. MUSCULOSKELETAL: No joint pain or arthritis.   NEUROLOGIC: No tingling, numbness, weakness.  PSYCHIATRY: No anxiety or depression.   MEDICATIONS AT HOME:  Prior to Admission medications   Medication Sig Start Date End Date Taking? Authorizing Provider  aspirin 81 MG EC tablet Take 81 mg by mouth daily. 11/17/16  Yes [provider]  atorvastatin (LIPITOR) 40 MG tablet Take 1 tablet (40 mg total) by mouth daily. 11/16/16  Yes Vaughan Basta, MD  budesonide-formoterol [facility]) 160-4.5 MCG/ACT inhaler Inhale 2 puffs into the lungs daily. 11/16/16  Yes Vaughan Basta, MD  ferrous sulfate 325 (65 FE) MG tablet Take 1 tablet by mouth 3 (three) times daily. 01/21/17  Yes [provider]  furosemide (LASIX) 80 MG tablet Take 1 tablet (80 mg total) daily by mouth. 01/25/17  Yes Demetrios Loll, MD  gabapentin (NEURONTIN) 300 MG capsule TAKE 1 CAPSULE BY MOUTH EVERY EVENING AND 1 EXTRA CAP ON DIALYSIS DAYS AS NEEDED FOR NERVE PAIN 08/16/16  Yes [provider]  ipratropium-albuterol (DUONEB) 0.5-2.5 (3) MG/3ML SOLN Take 3 mLs by nebulization every 6 (six) hours as needed. 11/28/16  Yes Dundas,  Richard, MD  labetalol (NORMODYNE) 100 MG tablet Take 1 tablet (100 mg total) by mouth daily. 11/16/16  Yes Vaughan Basta, MD  lactulose (CHRONULAC) 10 GM/15ML solution Take 30 mLs (20 g total) by mouth 3 (three) times daily. 01/11/17  Yes Mody, Ulice Bold, MD  levofloxacin (LEVAQUIN) 750 MG tablet Take 1 tablet (750 mg total) by mouth daily for 6 days. 02/19/17 02/25/17 Yes Darel Hong, MD  Multiple Vitamins-Minerals-FA (DIALYVITE SUPREME D) 3 MG TABS Take 1 tablet (3 mg total) by mouth daily. 11/28/16  Yes Wieting, Richard, MD  pantoprazole (PROTONIX) 40 MG tablet Take 1 tablet (40 mg total) by mouth daily. 11/28/16  Yes Wieting, Richard, MD  tiotropium (SPIRIVA HANDIHALER) 18 MCG inhalation capsule Place 1 capsule (18 mcg total) into inhaler and inhale daily. 11/16/16  11/16/17 Yes Vaughan Basta, MD  albuterol (PROVENTIL HFA;VENTOLIN HFA) 108 (90 Base) MCG/ACT inhaler Inhale 2 puffs into the lungs every 6 (six) hours as needed for wheezing or shortness of breath. 02/19/17   Darel Hong, MD  alum & mag hydroxide-simeth (MAALOX/MYLANTA) 200-200-20 MG/5ML suspension Take 15 mLs every 4 (four) hours as needed by mouth for indigestion or heartburn. 01/25/17   Demetrios Loll, MD  nicotine (NICODERM CQ - DOSED IN MG/24 HOURS) 21 mg/24hr patch Place 1 patch (21 mg total) onto the skin daily. 01/12/17   Bettey Costa, MD  nitroGLYCERIN (NITROSTAT) 0.4 MG SL tablet Place 1 tablet (0.4 mg total) under the tongue every 5 (five) minutes as needed. 04/12/16   Wende Bushy, MD  sevelamer carbonate (RENVELA) 800 MG tablet Take 3 tablets (2,400 mg total) by mouth 3 (three) times daily with meals. Patient not taking: Reported on 01/22/2017 11/16/16   Vaughan Basta, MD  Spacer/Aero Chamber Mouthpiece MISC 1 Units by Does not apply route every 4 (four) hours as needed (wheezing). 02/19/17   Darel Hong, MD      PHYSICAL EXAMINATION:   VITAL SIGNS: Blood pressure 126/84, pulse 75, temperature 98.4 F (36.9 C), temperature source Oral, resp. rate (!) 23, height 6\' 3"  (1.905 m), weight 175 lb (79.4 kg), SpO2 100 %.  GENERAL:  56 y.o.-year-old patient lying in the bed with no acute distress.  EYES: Pupils equal, round, reactive to light and accommodation. No scleral icterus. Extraocular muscles intact.  HEENT: Head atraumatic, normocephalic. Oropharynx and nasopharynx clear.  NECK:  Supple, no jugular venous distention. No thyroid enlargement, no tenderness.  LUNGS: Normal breath sounds bilaterally, no wheezing, rales,rhonchi or crepitation. No use of accessory muscles of respiration.  CARDIOVASCULAR: S1, S2 normal. No murmurs, rubs, or gallops.  ABDOMEN: Soft, nontender, nondistended. Bowel sounds present. No organomegaly or mass.  EXTREMITIES: No pedal edema,  cyanosis, or clubbing.  NEUROLOGIC: Cranial nerves II through XII are intact. Muscle strength 5/5 in all extremities. Sensation intact. Gait not checked.  PSYCHIATRIC: The patient is alert and oriented x 3.  SKIN: No obvious rash, lesion, or ulcer.   LABORATORY PANEL:   CBC Recent Labs  Lab 02/22/17 1403  WBC 4.7  HGB 7.6*  HCT 25.0*  PLT 196  MCV 88.7  MCH 27.0  MCHC 30.4*  RDW 21.3*   ------------------------------------------------------------------------------------------------------------------  Chemistries  Recent Labs  Lab 02/22/17 1403  NA 137  K 5.2*  CL 84*  CO2 17*  GLUCOSE 84  BUN 41*  CREATININE 6.20*  CALCIUM 4.9*  MG 3.1*  AST 18  ALT 12*  ALKPHOS 103  BILITOT 0.5   ------------------------------------------------------------------------------------------------------------------ estimated creatinine clearance is 14.8 mL/min (A) (by C-G  formula based on SCr of 6.2 mg/dL (H)). ------------------------------------------------------------------------------------------------------------------ No results for input(s): TSH, T4TOTAL, T3FREE, THYROIDAB in the last 72 hours.  Invalid input(s): FREET3   Coagulation profile No results for input(s): INR, PROTIME in the last 168 hours. ------------------------------------------------------------------------------------------------------------------- No results for input(s): DDIMER in the last 72 hours. -------------------------------------------------------------------------------------------------------------------  Cardiac Enzymes Recent Labs  Lab 02/22/17 1403  TROPONINI <0.03   ------------------------------------------------------------------------------------------------------------------ Invalid input(s): POCBNP  ---------------------------------------------------------------------------------------------------------------  Urinalysis    Component Value Date/Time   COLORURINE YELLOW (A)  05/03/2015 0815   APPEARANCEUR CLEAR (A) 05/03/2015 0815   APPEARANCEUR Clear 11/13/2013 1314   LABSPEC 1.008 05/03/2015 0815   LABSPEC 1.014 11/13/2013 1314   PHURINE 9.0 (H) 05/03/2015 0815   GLUCOSEU NEGATIVE 05/03/2015 0815   GLUCOSEU 50 mg/dL 11/13/2013 1314   HGBUR NEGATIVE 05/03/2015 0815   BILIRUBINUR NEGATIVE 05/03/2015 0815   BILIRUBINUR Negative 11/13/2013 1314   KETONESUR NEGATIVE 05/03/2015 0815   PROTEINUR 100 (A) 05/03/2015 0815   UROBILINOGEN 0.2 06/22/2014 1230   NITRITE NEGATIVE 05/03/2015 0815   LEUKOCYTESUR NEGATIVE 05/03/2015 0815   LEUKOCYTESUR Negative 11/13/2013 1314     RADIOLOGY: US Paracentesis  Result Date: 02/22/2017 CLINICAL DATA:  Ascites, recurrent.  End-stage renal disease. EXAM: ULTRASOUND GUIDED PARACENTESIS TECHNIQUE: The procedure, risks (including but not limited to bleeding, infection, organ damage ), benefits, and alternatives were explained to the patient. Questions regarding the procedure were encouraged and answered. The patient understands and consents to the procedure. Survey ultrasound of the abdomen was performed and an appropriate skin entry site in the right lateral abdomen was selected. Skin site was marked, prepped with chlorhexadine, draped in usual sterile fashion, and infiltrated locally with 1% lidocaine. A 73F multisidehole Yueh sheath needle was advanced into the peritoneal space until fluid could be aspirated. The sheath was advanced and the needle removed. 7.3 L of clear yellow ascites were aspirated. The patient tolerated the procedure well, without immediate complication. COMPLICATIONS: COMPLICATIONS none IMPRESSION: Technically successful ultrasound guided paracentesis, removing 7.3 L ascites. Electronically Signed   By: Lucrezia Europe M.D.   On: 02/22/2017 13:45   Dg Chest Port 1 View  Result Date: 02/22/2017 CLINICAL DATA:  Weakness, lethargy, malaise. Unable to tolerate paracentesis today. History of CHF, cirrhosis, diabetes,  coronary artery disease, current smoker. EXAM: PORTABLE CHEST 1 VIEW COMPARISON:  Portable chest x-ray of February 19, 2017 FINDINGS: The lungs are adequately inflated. There is no focal infiltrate. There is no pleural effusion. The cardiac silhouette is mildly enlarged. The central pulmonary vascularity is prominent but there is no interstitial edema. External pacemaker defibrillator pads are present. There is calcification in the wall of the aortic arch. The sternal wires are intact. IMPRESSION: Cardiomegaly and central pulmonary vascular congestion without pulmonary edema. No acute pneumonia. Thoracic aortic atherosclerosis. Electronically Signed   By: David  Martinique M.D.   On: 02/22/2017 14:45    EKG: Orders placed or performed during the hospital encounter of 01/22/17  . ED EKG  . ED EKG  . EKG 12-Lead  . EKG 12-Lead  . EKG    IMPRESSION AND PLAN: Patient is a 56 year old with medical noncompliance presenting with generalized weakness  1.  Hypocalcemia: Unclear if this is truly hypocalcemia or due to  him receiving albumin infusion Ionized calcium is pending, will replace calcium based on that Patient also is having prolonged QT as well as wide QRS-we will monitor on telemetry after hemodialysis and calcium replacement of these persist will need cardiology evaluation  2.  End-stage renal  disease patient will be hemodialyzing today nephrology has been notified  3.  Liver cirrhosis: Continue therapy with Lasix, lactulose  4.  COPD continue inhalers and nebulizer therapy  5.  Hyperlipidemia unspecified continue Lipitor  6.  Nicotine abuse smoking cessation provided 4 minutes spent patient will be continued on nicotine patch  7.  Miscellaneous heparin for DVT prophylaxis   All the records are reviewed and case discussed with ED provider. Management plans discussed with the patient, family and they are in agreement.  CODE STATUS: Code Status History    Date Active Date Inactive  Code Status Order ID Comments User Context   01/22/2017 23:37 01/25/2017 15:22 Full Code [id number]  Gorden Harms, MD Inpatient   01/07/2017 14:45 01/13/2017 18:12 Full Code [id number]  Idelle Crouch, MD Inpatient   12/22/2016 14:31 12/24/2016 18:04 Full Code [id number]  Hillary Bow, MD ED   12/20/2016 20:20 12/21/2016 18:53 Full Code [id number]  Demetrios Loll, MD Inpatient   11/26/2016 10:30 11/28/2016 15:22 Full Code [id number]  Henreitta Leber, MD Inpatient   11/15/2016 19:30 11/16/2016 18:22 Full Code [id number]  Fritzi Mandes, MD Inpatient   10/29/2016 14:53 10/30/2016 15:06 Full Code [id number]  Loletha Grayer, MD ED   09/15/2016 17:57 09/16/2016 18:17 Full Code [id number]  Dustin Flock, MD ED   08/16/2016 09:45 08/18/2016 18:16 Full Code [id number]  Hillary Bow, MD ED   08/09/2016 16:59 08/12/2016 20:34 Full Code [id number]  Fritzi Mandes, MD Inpatient   07/03/2016 21:54 07/05/2016 21:22 Full Code [id number]  Demetrios Loll, MD Inpatient   06/24/2016 04:23 06/26/2016 15:46 Full Code [id number]  Saundra Shelling, MD Inpatient   06/14/2016 14:53 06/19/2016 21:04 Full Code [id number]  Hillary Bow, MD ED   05/31/2016 06:42 06/01/2016 21:30 Full Code [id number]  Saundra Shelling, MD Inpatient   05/05/2016 23:09 05/10/2016 21:57 Full Code [id number]  Vianne Bulls, MD Inpatient   04/16/2016 03:56 04/17/2016 18:27 Full Code [id number]  Saundra Shelling, MD Inpatient   01/19/2016 15:13 01/22/2016 14:14 Full Code [id number]  Loletha Grayer, MD ED   09/24/2015 10:33 09/25/2015 13:31 Full Code [id number]  Loletha Grayer, MD ED   05/16/2015 14:17 05/17/2015 20:57 Full Code [id number]  Idelle Crouch, MD Inpatient   04/30/2015 20:08 05/08/2015 19:40 Full Code [id number]  Vaughan Basta, MD Inpatient   04/06/2015 05:07 04/07/2015 17:36 Full Code [id number]  Lance Coon, MD Inpatient   03/23/2015 11:07 03/27/2015 12:46 Full Code [id number]  Bettey Costa, MD Inpatient   06/19/2013 11:32 06/21/2013 17:49 Full Code [id number]  Cristal Ford, DO  Inpatient   06/16/2013 17:26 06/19/2013 11:32 Full Code [id number]  Margarita Mail, PA-C ED   06/05/2013 15:33 06/06/2013 18:56 Full Code [id number]  Theodoro Kos, DO Inpatient   06/02/2013 09:01 06/05/2013 15:33 Full Code [id number]  Velvet Bathe, MD Inpatient   06/02/2013 08:40 06/02/2013 09:01 Full Code [id number]  Velvet Bathe, MD Inpatient       TOTAL TIME TAKING CARE OF THIS PATIENT55 minutes.    Dustin Flock M.D on 02/22/2017 at 3:30 PM  Between 7am to 6pm - Pager - [phone]  After 6pm go to [email or web address] - password [email or web address] [place] [facility]  Office  [phone]  CC: Primary care physician; [name], MD

## 2017-02-22 NOTE — ED Notes (Signed)
Pt placed on ZOLL pads

## 2017-02-22 NOTE — Progress Notes (Signed)
Pt. C/o significant weakness  And "not feeling good".  Lethargic.  VS stable.  Dr. Daiva Huge notified and ED called.

## 2017-02-22 NOTE — ED Provider Notes (Signed)
[facility] Emergency Department Provider Note   ____________________________________________    I have reviewed the triage vital signs and the nursing notes.   HISTORY  Chief Complaint Weakness     HPI Stanley [name] is a 56 y.o. male who presents with complaints of weakness.  Patient sent from interventional radiology because he apparently started saying "I do not feel right "after the procedure.  He had paracentesis which was tolerated well.  Does have end-stage renal disease and reports he has missed 2 sessions of dialysis primarily because of the weather.  Denies chest pain.  No shortness of breath.  No cough.  Reports he has had weak legs bilaterally ever since starting antibiotics earlier this week.  No headache.  No nausea or vomiting.   Past Medical History:  Diagnosis Date  . Alcohol abuse   . CHF (congestive heart failure) ([place])   . Cirrhosis ([place])   . Coronary artery disease 2009  . Diabetic peripheral neuropathy ([place])   . Drug abuse ([place])   . End stage renal disease on dialysis [facility]) NEPHROLOGIST-   DR [facility]  IN [place]   HEMODIALYSIS --   TUES/  THURS/  SAT  . GERD (gastroesophageal reflux disease)   . Hyperlipidemia   . Hypertension   . PAD (peripheral artery disease) ([place])   . Renal insufficiency    Per pt, 32 oz fluid restriction per day  . S/P triple vessel bypass 06/09/2016   2009ish  . Suicidal ideation    & HOMICIDAL IDEATION --  06-16-2013   ADMITTED TO BEHAVIOR HEALTH    Patient Active Problem List   Diagnosis Date Noted  . Shortness of breath 11/26/2016  . COPD (chronic obstructive pulmonary disease) ([place]) 10/30/2016  . COPD exacerbation ([place]) 10/29/2016  . Anemia   . Heme positive stool   . Ulceration of intestine   . Benign neoplasm of transverse colon   . Acute gastrointestinal hemorrhage   . Esophageal candidiasis ([place])   . Angiodysplasia of intestinal tract   . Acute respiratory failure with  hypoxia ([place]) 07/03/2016  . GI bleeding 06/24/2016  . Rectal bleeding 06/14/2016  . Anemia of chronic disease 06/01/2016  . MRSA carrier 06/01/2016  . Chronic renal failure 05/23/2016  . Ischemic heart disease 05/23/2016  . Angiodysplasia of small intestine   . Melena   . Small bowel bleed not requiring more than 4 units of blood in 24 hours, ICU, or surgery   . Anemia due to chronic blood loss   . Abdominal pain 05/05/2016  . Acute posthemorrhagic anemia 04/17/2016  . Gastrointestinal bleed 04/17/2016  . History of esophagogastroduodenoscopy (EGD) 04/17/2016  . Elevated troponin 04/17/2016  . Alcohol abuse 04/17/2016  . Upper GI bleed 01/19/2016  . Blood in stool   . Angiodysplasia of stomach and duodenum with hemorrhage   . Gastritis   . Esophagitis, unspecified   . GI bleed 05/16/2015  . Acute GI bleeding   . Symptomatic anemia 04/30/2015  . HTN (hypertension) 04/06/2015  . GERD (gastroesophageal reflux disease) 04/06/2015  . HLD (hyperlipidemia) 04/06/2015  . Dyspnea 04/06/2015  . Cirrhosis of liver with ascites ([place]) 04/06/2015  . Ascites 04/06/2015  . GIB (gastrointestinal bleeding) 03/23/2015  . Homicidal ideation 06/19/2013  . Suicidal intent 06/19/2013  . Homicidal ideations 06/19/2013  . Hyperkalemia 06/16/2013  . Mandible fracture ([place]) 06/05/2013  . Fracture, mandible ([place]) 06/02/2013  . Coronary atherosclerosis of native coronary artery 06/02/2013  . ESRD on dialysis (  [place]) 06/02/2013  . Mandible open fracture ([place]) 06/02/2013    Past Surgical History:  Procedure Laterality Date  . AGILE CAPSULE N/A 06/19/2016   Procedure: AGILE CAPSULE;  Surgeon: [name], MD;  Location: [facility] ENDOSCOPY;  Service: Endoscopy;  Laterality: N/A;  . COLONOSCOPY WITH PROPOFOL N/A 06/18/2016   Procedure: COLONOSCOPY WITH PROPOFOL;  Surgeon: [name], MD;  Location: [facility] ENDOSCOPY;  Service: Endoscopy;  Laterality: N/A;  . COLONOSCOPY WITH PROPOFOL N/A 08/12/2016   Procedure:  COLONOSCOPY WITH PROPOFOL;  Surgeon: [name], MD;  Location: [facility] ENDOSCOPY;  Service: Endoscopy;  Laterality: N/A;  . CORONARY ANGIOPLASTY  ?   PT UNABLE TO TELL IF  BEFORE OR AFTER  CABG  . CORONARY ARTERY BYPASS GRAFT  2008  ([place] , [place])   3 VESSEL  . DIALYSIS FISTULA CREATION  LAST SURGERY  APPOX  2008  . ENTEROSCOPY N/A 05/10/2016   Procedure: ENTEROSCOPY;  Surgeon: Jerene Bears, MD;  Location: [place];  Service: Gastroenterology;  Laterality: N/A;  . ENTEROSCOPY N/A 08/12/2016   Procedure: ENTEROSCOPY;  Surgeon: [name], MD;  Location: [facility] ENDOSCOPY;  Service: Endoscopy;  Laterality: N/A;  . ESOPHAGOGASTRODUODENOSCOPY N/A 05/07/2015   Procedure: ESOPHAGOGASTRODUODENOSCOPY (EGD);  Surgeon: Hulen Luster, MD;  Location: [facility] ENDOSCOPY;  Service: Endoscopy;  Laterality: N/A;  . ESOPHAGOGASTRODUODENOSCOPY (EGD) WITH PROPOFOL N/A 05/17/2015   Procedure: ESOPHAGOGASTRODUODENOSCOPY (EGD) WITH PROPOFOL;  Surgeon: [name], MD;  Location: [facility] ENDOSCOPY;  Service: Endoscopy;  Laterality: N/A;  . ESOPHAGOGASTRODUODENOSCOPY (EGD) WITH PROPOFOL N/A 01/20/2016   Procedure: ESOPHAGOGASTRODUODENOSCOPY (EGD) WITH PROPOFOL;  Surgeon: [name], MD;  Location: [facility] ENDOSCOPY;  Service: Endoscopy;  Laterality: N/A;  . ESOPHAGOGASTRODUODENOSCOPY (EGD) WITH PROPOFOL N/A 04/17/2016   Procedure: ESOPHAGOGASTRODUODENOSCOPY (EGD) WITH PROPOFOL;  Surgeon: Lin Landsman, MD;  Location: [facility] ENDOSCOPY;  Service: Gastroenterology;  Laterality: N/A;  . ESOPHAGOGASTRODUODENOSCOPY (EGD) WITH PROPOFOL  05/09/2016   Procedure: ESOPHAGOGASTRODUODENOSCOPY (EGD) WITH PROPOFOL;  Surgeon: Jerene Bears, MD;  Location: [place];  Service: Endoscopy;;  . ESOPHAGOGASTRODUODENOSCOPY (EGD) WITH PROPOFOL N/A 06/16/2016   Procedure: ESOPHAGOGASTRODUODENOSCOPY (EGD) WITH PROPOFOL;  Surgeon: [name], MD;  Location: [facility] ENDOSCOPY;  Service: Endoscopy;  Laterality: N/A;  . GIVENS CAPSULE STUDY N/A 05/07/2016    Procedure: GIVENS CAPSULE STUDY;  Surgeon: Doran Stabler, MD;  Location: [place];  Service: Endoscopy;  Laterality: N/A;  . MANDIBULAR HARDWARE REMOVAL N/A 07/29/2013   Procedure: REMOVAL OF ARCH BARS;  Surgeon: Theodoro Kos, DO;  Location: [place];  Service: Plastics;  Laterality: N/A;  . ORIF MANDIBULAR FRACTURE N/A 06/05/2013   Procedure: REPAIR OF MANDIBULAR FRACTURE x 2 with maxillo-mandibular fixation ;  Surgeon: Theodoro Kos, DO;  Location: [place];  Service: Plastics;  Laterality: N/A;  . PERIPHERAL ARTERIAL STENT GRAFT Left     Prior to Admission medications   Medication Sig Start Date End Date Taking? Authorizing Provider  albuterol (PROVENTIL HFA;VENTOLIN HFA) 108 (90 Base) MCG/ACT inhaler Inhale 4-6 puffs by mouth every 4 hours as needed for wheezing, cough, and/or shortness of breath 11/16/16   Vaughan Basta, MD  albuterol (PROVENTIL HFA;VENTOLIN HFA) 108 (90 Base) MCG/ACT inhaler Inhale 2 puffs into the lungs every 6 (six) hours as needed for wheezing or shortness of breath. 02/19/17   Darel Hong, MD  alum & mag hydroxide-simeth (MAALOX/MYLANTA) 200-200-20 MG/5ML suspension Take 15 mLs every 4 (four) hours as needed by mouth for indigestion or heartburn. 01/25/17   Demetrios Loll, MD  aspirin 81 MG EC tablet Take  81 mg by mouth daily. 11/17/16   [provider]  atorvastatin (LIPITOR) 40 MG tablet Take 1 tablet (40 mg total) by mouth daily. 11/16/16   Vaughan Basta, MD  budesonide-formoterol (SYMBICORT) 160-4.5 MCG/ACT inhaler Inhale 2 puffs into the lungs daily. 11/16/16   Vaughan Basta, MD  ferrous sulfate 325 (65 FE) MG tablet Take 1 tablet by mouth 3 (three) times daily. 01/21/17   [provider]  furosemide (LASIX) 80 MG tablet Take 1 tablet (80 mg total) daily by mouth. 01/25/17   Demetrios Loll, MD  gabapentin (NEURONTIN) 300 MG capsule TAKE 1 CAPSULE BY MOUTH EVERY EVENING AND 1 EXTRA CAP ON DIALYSIS DAYS AS NEEDED  FOR NERVE PAIN 08/16/16   [provider]  ipratropium-albuterol (DUONEB) 0.5-2.5 (3) MG/3ML SOLN Take 3 mLs by nebulization every 6 (six) hours as needed. 11/28/16   Loletha Grayer, MD  labetalol (NORMODYNE) 100 MG tablet Take 1 tablet (100 mg total) by mouth daily. 11/16/16   Vaughan Basta, MD  lactulose (CHRONULAC) 10 GM/15ML solution Take 30 mLs (20 g total) by mouth 3 (three) times daily. 01/11/17   Bettey Costa, MD  levofloxacin (LEVAQUIN) 750 MG tablet Take 1 tablet (750 mg total) by mouth daily for 6 days. 02/19/17 02/25/17  Darel Hong, MD  Multiple Vitamins-Minerals-FA (DIALYVITE SUPREME D) 3 MG TABS Take 1 tablet (3 mg total) by mouth daily. 11/28/16   Loletha Grayer, MD  nicotine (NICODERM CQ - DOSED IN MG/24 HOURS) 21 mg/24hr patch Place 1 patch (21 mg total) onto the skin daily. 01/12/17   Bettey Costa, MD  nitroGLYCERIN (NITROSTAT) 0.4 MG SL tablet Place 1 tablet (0.4 mg total) under the tongue every 5 (five) minutes as needed. 04/12/16   Wende Bushy, MD  pantoprazole (PROTONIX) 40 MG tablet Take 1 tablet (40 mg total) by mouth daily. 11/28/16   Loletha Grayer, MD  sevelamer carbonate (RENVELA) 800 MG tablet Take 3 tablets (2,400 mg total) by mouth 3 (three) times daily with meals. Patient not taking: Reported on 01/22/2017 11/16/16   Vaughan Basta, MD  Spacer/Aero Chamber Mouthpiece MISC 1 Units by Does not apply route every 4 (four) hours as needed (wheezing). 02/19/17   Darel Hong, MD  tiotropium (SPIRIVA HANDIHALER) 18 MCG inhalation capsule Place 1 capsule (18 mcg total) into inhaler and inhale daily. 11/16/16 11/16/17  Vaughan Basta, MD     Allergies Patient has no known allergies.  Family History  Problem Relation Age of Onset  . Colon cancer Mother   . Cancer Father   . Cancer Sister   . Kidney disease Brother     Social History Social History   Tobacco Use  . Smoking status: Current Every Day Smoker    Packs/day: 0.15    Years:  40.00    Pack years: 6.00    Types: Cigarettes  . Smokeless tobacco: Never Used  Substance Use Topics  . Alcohol use: No    Comment: pt reports quitting after learning about cirrhosis  . Drug use: No    Review of Systems  Constitutional: No fever/chills Eyes: No visual changes.  ENT: No neck pain Cardiovascular: Denies chest pain. Respiratory: Denies shortness of breath. Gastrointestinal: No abdominal pain.  No nausea, no vomiting.   Genitourinary: Negative for dysuria. Musculoskeletal: Negative for back pain. Skin: Negative for rash. Neurological: Negative for headaches    ____________________________________________   PHYSICAL EXAM:  VITAL SIGNS: ED Triage Vitals  Enc Vitals Group     BP 02/22/17 1400 126/84  Pulse Rate 02/22/17 1400 75     Resp 02/22/17 1400 (!) 23     Temp 02/22/17 1400 98.4 F (36.9 C)     Temp Source 02/22/17 1400 Oral     SpO2 02/22/17 1400 100 %     Weight 02/22/17 1400 79.4 kg (175 lb)     Height 02/22/17 1400 1.905 m (6\' 3" )     Head Circumference --      Peak Flow --      Pain Score 02/22/17 1359 8     Pain Loc --      Pain Edu? --      Excl. in [place]? --     Constitutional: Alert and oriented. No acute distress.  Eyes: Conjunctivae are normal.   Nose: No congestion/rhinnorhea. Mouth/Throat: Mucous membranes are moist.   Neck:  Painless ROM Cardiovascular: Normal rate, . Grossly normal heart sounds.  Good peripheral circulation. Respiratory: Normal respiratory effort.  No retractions. Lungs CTAB. Gastrointestinal: Soft and nontender. No distention.  No CVA tenderness. Genitourinary: deferred Musculoskeletal:   Warm and well perfused Neurologic:  Normal speech and language. No gross focal neurologic deficits are appreciated.  Able to lift both legs off the bed Skin:  Skin is warm, dry and intact. No rash noted. Psychiatric: Mood and affect are normal. Speech and behavior are  normal.  ____________________________________________   LABS (all labs ordered are listed, but only abnormal results are displayed)  Labs Reviewed  CBC - Abnormal; Notable for the following components:      Result Value   RBC 2.82 (*)    Hemoglobin 7.6 (*)    HCT 25.0 (*)    MCHC 30.4 (*)    RDW 21.3 (*)    All other components within normal limits  COMPREHENSIVE METABOLIC PANEL - Abnormal; Notable for the following components:   Potassium 5.2 (*)    Chloride 84 (*)    CO2 17 (*)    BUN 41 (*)    Creatinine, Ser 6.20 (*)    Calcium 4.9 (*)    Total Protein >12.0 (*)    Albumin 11.4 (*)    ALT 12 (*)    GFR calc non Af Amer 9 (*)    GFR calc Af Amer 10 (*)    Anion gap 36 (*)    All other components within normal limits  MAGNESIUM - Abnormal; Notable for the following components:   Magnesium 3.1 (*)    All other components within normal limits  PHOSPHORUS - Abnormal; Notable for the following components:   Phosphorus 2.1 (*)    All other components within normal limits  CULTURE, BLOOD (ROUTINE X 2)  CULTURE, BLOOD (ROUTINE X 2)  TROPONIN I  LACTIC ACID, PLASMA  LACTIC ACID, PLASMA   ____________________________________________  EKG  ED ECG REPORT I, Lavonia Drafts, the attending physician, personally viewed and interpreted this ECG.  Date: 02/22/2017  Rhythm: Sinus rhythm QRS Axis: normal Intervals: Prolonged QT, prolonged PR, IVCD ST/T Wave abnormalities: Nonspecific Narrative Interpretation: Significant change in EKG, concern for hyperkalemia  ____________________________________________  RADIOLOGY  Chest x-ray no acute distress ____________________________________________   PROCEDURES  Procedure(s) performed: No  Procedures   Critical Care performed: yes  CRITICAL CARE Performed by: Lavonia Drafts   Total critical care time:30 minutes  Critical care time was exclusive of separately billable procedures and treating other  patients.  Critical care was necessary to treat or prevent imminent or life-threatening deterioration.  Critical care was time spent personally by  me on the following activities: development of treatment plan with patient and/or surrogate as well as nursing, discussions with consultants, evaluation of patient's response to treatment, examination of patient, obtaining history from patient or surrogate, ordering and performing treatments and interventions, ordering and review of laboratory studies, ordering and review of radiographic studies, pulse oximetry and re-evaluation of patient's condition.  ____________________________________________   INITIAL IMPRESSION / ASSESSMENT AND PLAN / ED COURSE  Pertinent labs & imaging results that were available during my care of the patient were reviewed by me and considered in my medical decision making (see chart for details).  Patient presents with a vague sense of not feeling right, apparently some confusion and interventional radiology.  Has apparently missed 2 sessions of dialysis  ----------------------------------------- 2:26 PM on 02/22/2017 -----------------------------------------  Labs have not returned but given concern for hyperkalemia discussed with Dr. Holley Raring of nephrology, he recommends giving insulin/dextrose, albuterol, calcium and he will call to arrange for likely emergent dialysis  ----------------------------------------- 3:05 PM on 02/22/2017 -----------------------------------------  Call the lab to ask about potassium level, apparently potassium is 5.2 but lab reports calcium is 4.2 which may explain his concerning EKG.  Discussed this with Dr. Holley Raring, patient did receive IV calcium initially we will admit for further evaluation and dialysis.  Possibility of lab draw error, we will redraw the BMP to confirm calcium      ____________________________________________   FINAL CLINICAL IMPRESSION(S) / ED DIAGNOSES  Final  diagnoses:  Hypocalcemia        Note:  This document was prepared using Dragon voice recognition software and may include unintentional dictation errors.     Lavonia Drafts, MD 02/22/17 [phone]

## 2017-02-22 NOTE — Progress Notes (Signed)
Dr. Posey Pronto paged by this RN at 1730 due to pt requesting morphine.

## 2017-02-22 NOTE — Plan of Care (Signed)
Pt admitted this shift from ED . A&Ox4. VSS. RA . Admission assessment complete. Will continue to monitor and report to oncoming RN .  Progressing Education: Knowledge of General Education information will improve 02/22/2017 1834 - Progressing by Aleen Campi, RN Health Behavior/Discharge Planning: Ability to manage health-related needs will improve 02/22/2017 1834 - Progressing by Aleen Campi, RN Clinical Measurements: Ability to maintain clinical measurements within normal limits will improve 02/22/2017 1834 - Progressing by Aleen Campi, RN Will remain free from infection 02/22/2017 1834 - Progressing by Aleen Campi, RN Diagnostic test results will improve 02/22/2017 1834 - Progressing by Aleen Campi, RN Respiratory complications will improve 02/22/2017 1834 - Progressing by Aleen Campi, RN Cardiovascular complication will be avoided 02/22/2017 1834 - Progressing by Aleen Campi, RN Activity: Risk for activity intolerance will decrease 02/22/2017 1834 - Progressing by Aleen Campi, RN Nutrition: Adequate nutrition will be maintained 02/22/2017 1834 - Progressing by Aleen Campi, RN Coping: Level of anxiety will decrease 02/22/2017 1834 - Progressing by Aleen Campi, RN Elimination: Will not experience complications related to bowel motility 02/22/2017 1834 - Progressing by Aleen Campi, RN Will not experience complications related to urinary retention 02/22/2017 1834 - Progressing by Aleen Campi, RN Pain Managment: General experience of comfort will improve 02/22/2017 1834 - Progressing by Aleen Campi, RN Safety: Ability to remain free from injury will improve 02/22/2017 1834 - Progressing by Aleen Campi, RN Skin Integrity: Risk for impaired skin integrity will decrease 02/22/2017 1834 - Progressing by Aleen Campi, RN

## 2017-02-22 NOTE — ED Notes (Signed)
Albumin stopped at this time

## 2017-02-22 NOTE — ED Triage Notes (Signed)
Patient had scheduled appointment today for paracentesis. After paracentesis went to specials to receive albumin. While receiving albumin patient started feeling very lethargic and weak and kept stating "I do not feel good" Specials also reported patient was not acting himself. Patient A&O x4 upon arrival to ED but very weak.

## 2017-02-22 NOTE — ED Notes (Signed)
Attempted to call report and was advised that nurse was in isolation room and would return the call

## 2017-02-23 LAB — CBC
HCT: 35.2 % — ABNORMAL LOW (ref 40.0–52.0)
HEMOGLOBIN: 10.9 g/dL — AB (ref 13.0–18.0)
MCH: 27.1 pg (ref 26.0–34.0)
MCHC: 31 g/dL — ABNORMAL LOW (ref 32.0–36.0)
MCV: 87.6 fL (ref 80.0–100.0)
PLATELETS: 264 10*3/uL (ref 150–440)
RBC: 4.01 MIL/uL — ABNORMAL LOW (ref 4.40–5.90)
RDW: 20.8 % — ABNORMAL HIGH (ref 11.5–14.5)
WBC: 7.3 10*3/uL (ref 3.8–10.6)

## 2017-02-23 LAB — BASIC METABOLIC PANEL
ANION GAP: 10 (ref 5–15)
BUN: 63 mg/dL — ABNORMAL HIGH (ref 6–20)
CO2: 24 mmol/L (ref 22–32)
Calcium: 7.9 mg/dL — ABNORMAL LOW (ref 8.9–10.3)
Chloride: 99 mmol/L — ABNORMAL LOW (ref 101–111)
Creatinine, Ser: 10.29 mg/dL — ABNORMAL HIGH (ref 0.61–1.24)
GFR calc Af Amer: 6 mL/min — ABNORMAL LOW (ref >60)
GFR, EST NON AFRICAN AMERICAN: 5 mL/min — AB (ref >60)
Glucose, Bld: 107 mg/dL — ABNORMAL HIGH (ref 65–99)
SODIUM: 133 mmol/L — AB (ref 135–145)

## 2017-02-23 LAB — CALCIUM, IONIZED: CALCIUM, IONIZED, SERUM: 4.5 mg/dL (ref 4.5–5.6)

## 2017-02-23 LAB — PHOSPHORUS: PHOSPHORUS: 5.1 mg/dL — AB (ref 2.5–4.6)

## 2017-02-23 LAB — MRSA PCR SCREENING: MRSA BY PCR: NEGATIVE

## 2017-02-23 MED ORDER — ALBUTEROL SULFATE (2.5 MG/3ML) 0.083% IN NEBU
2.5000 mg | INHALATION_SOLUTION | Freq: Four times a day (QID) | RESPIRATORY_TRACT | Status: DC
  Administered 2017-02-23 – 2017-02-24 (×4): 2.5 mg via RESPIRATORY_TRACT
  Filled 2017-02-23 (×4): qty 3

## 2017-02-23 MED ORDER — CALCIUM GLUCONATE 10 % IV SOLN
1.0000 g | Freq: Once | INTRAVENOUS | Status: AC
  Administered 2017-02-23: 1 g via INTRAVENOUS
  Filled 2017-02-23: qty 10

## 2017-02-23 MED ORDER — DIPHENHYDRAMINE HCL 50 MG/ML IJ SOLN
25.0000 mg | Freq: Once | INTRAMUSCULAR | Status: AC
  Administered 2017-02-23: 25 mg via INTRAVENOUS

## 2017-02-23 MED ORDER — PATIROMER SORBITEX CALCIUM 8.4 G PO PACK
25.2000 g | PACK | Freq: Once | ORAL | Status: AC
  Administered 2017-02-23: 25.2 g via ORAL
  Filled 2017-02-23: qty 4

## 2017-02-23 MED ORDER — SODIUM CHLORIDE 0.9 % IV SOLN
25.0000 mg | INTRAVENOUS | Status: DC | PRN
  Filled 2017-02-23: qty 0.5

## 2017-02-23 MED ORDER — BUDESONIDE 0.5 MG/2ML IN SUSP
0.5000 mg | Freq: Two times a day (BID) | RESPIRATORY_TRACT | Status: DC
  Administered 2017-02-23 – 2017-02-24 (×3): 0.5 mg via RESPIRATORY_TRACT
  Filled 2017-02-23 (×3): qty 2

## 2017-02-23 MED ORDER — OXYCODONE-ACETAMINOPHEN 7.5-325 MG PO TABS
1.0000 | ORAL_TABLET | Freq: Four times a day (QID) | ORAL | Status: DC | PRN
  Administered 2017-02-23 – 2017-02-24 (×5): 1 via ORAL
  Filled 2017-02-23 (×5): qty 1

## 2017-02-23 NOTE — Progress Notes (Signed)
Pt had a peripheral leg pain 0220 Stanley [name] was notified and ordered PRN Percocet. Will continue to monitor.

## 2017-02-23 NOTE — Progress Notes (Signed)
Patient ID: Stanley [name], male   DOB: [birth year], 56 y.o.   MRN: [id number]  [facility] PROGRESS NOTE  Stanley [name] YOV:[id number] DOB: 1960/06/22 DOA: 02/22/2017 PCP: [name], MD  HPI/Subjective: Patient missed dialysis on Tuesday secondary to the snow.  The [name] broke down on Wednesday so he was unable to get dialysis on Wednesday.  Patient came in for a paracentesis yesterday.  Patient stated he has been very weak for a few days now.  Calcium was found to be low at 4.9 and albumin up at 11.4.  Objective: Vitals:   02/23/17 0355 02/23/17 0748  BP: 118/75 127/76  Pulse: 69 67  Resp: 17 18  Temp: 98.5 F (36.9 C)   SpO2: 97% 100%    Filed Weights   02/22/17 1400 02/22/17 1730 02/23/17 0355  Weight: 79.4 kg (175 lb) 72.8 kg (160 lb 8 oz) 74.9 kg (165 lb 1.6 oz)    ROS: Review of Systems  Constitutional: Negative for chills and fever.  Eyes: Negative for blurred vision.  Respiratory: Positive for shortness of breath. Negative for cough.   Cardiovascular: Negative for chest pain.  Gastrointestinal: Negative for abdominal pain, constipation, diarrhea, nausea and vomiting.  Genitourinary: Negative for dysuria.  Musculoskeletal: Positive for joint pain.  Neurological: Positive for weakness. Negative for dizziness and headaches.   Exam: Physical Exam  Constitutional: He is oriented to person, place, and time.  HENT:  Nose: No mucosal edema.  Mouth/Throat: No oropharyngeal exudate or posterior oropharyngeal edema.  Eyes: Conjunctivae, EOM and lids are normal. Pupils are equal, round, and reactive to light.  Neck: No JVD present. Carotid bruit is not present. No edema present. No thyroid mass and no thyromegaly present.  Cardiovascular: S1 normal and S2 normal. Exam reveals no gallop.  No murmur heard. Pulses:      Dorsalis pedis pulses are 2+ on the right side, and 2+ on the left side.  Respiratory: No respiratory distress. He has decreased  breath sounds in the right lower field and the left lower field. He has wheezes in the right middle field, the right lower field, the left middle field and the left lower field. He has no rhonchi. He has no rales.  GI: Soft. Bowel sounds are normal. There is no tenderness.  Musculoskeletal:       Right ankle: He exhibits swelling.       Left ankle: He exhibits swelling.  Lymphadenopathy:    He has no cervical adenopathy.  Neurological: He is alert and oriented to person, place, and time. No cranial nerve deficit.  Skin: Skin is warm. No rash noted. Nails show no clubbing.  Psychiatric: He has a normal mood and affect.      Data Reviewed: Basic Metabolic Panel: Recent Labs  Lab 02/22/17 1403 02/22/17 2039 02/23/17 0534  NA 137  --  133*  K 5.2*  --  >7.5*  CL 84*  --  99*  CO2 17*  --  24  GLUCOSE 84  --  107*  BUN 41*  --  63*  CREATININE 6.20*  --  10.29*  CALCIUM 4.9* 8.2* 7.9*  MG 3.1*  --   --   PHOS 2.1* 4.3  --    Liver Function Tests: Recent Labs  Lab 02/22/17 1403  AST 18  ALT 12*  ALKPHOS 103  BILITOT 0.5  PROT >12.0*  ALBUMIN 11.4*   No results for input(s): LIPASE, AMYLASE in the last 168 hours. No results for input(s):  AMMONIA in the last 168 hours. CBC: Recent Labs  Lab 02/22/17 1403 02/23/17 0534  WBC 4.7 7.3  HGB 7.6* 10.9*  HCT 25.0* 35.2*  MCV 88.7 87.6  PLT 196 264   Cardiac Enzymes: Recent Labs  Lab 02/22/17 1403  TROPONINI <0.03   BNP (last 3 results) Recent Labs    09/15/16 1528 10/20/16 1153 11/26/16 0633  BNP 2,716.0* 3,568.0* 3,470.0*     CBG: Recent Labs  Lab 02/22/17 1326  GLUCAP 107*    Recent Results (from the past 240 hour(s))  Blood culture (routine x 2)     Status: None (Preliminary result)   Collection Time: 02/22/17  2:03 PM  Result Value Ref Range Status   Specimen Description BLOOD LFOA  Final   Special Requests   Final    BOTTLES DRAWN AEROBIC AND ANAEROBIC Blood Culture adequate volume   Culture  NO GROWTH < 24 HOURS  Final   Report Status PENDING  Incomplete  Blood culture (routine x 2)     Status: None (Preliminary result)   Collection Time: 02/22/17  2:03 PM  Result Value Ref Range Status   Specimen Description BLOOD LEFT HAND  Final   Special Requests   Final    BOTTLES DRAWN AEROBIC AND ANAEROBIC Blood Culture adequate volume   Culture NO GROWTH < 24 HOURS  Final   Report Status PENDING  Incomplete  MRSA PCR Screening     Status: None   Collection Time: 02/22/17  8:47 PM  Result Value Ref Range Status   MRSA by PCR NEGATIVE NEGATIVE Final    Comment:        The GeneXpert MRSA Assay (FDA approved for NASAL specimens only), is one component of a comprehensive MRSA colonization surveillance program. It is not intended to diagnose MRSA infection nor to guide or monitor treatment for MRSA infections.      Studies: US Paracentesis  Result Date: 02/22/2017 CLINICAL DATA:  Ascites, recurrent.  End-stage renal disease. EXAM: ULTRASOUND GUIDED PARACENTESIS TECHNIQUE: The procedure, risks (including but not limited to bleeding, infection, organ damage ), benefits, and alternatives were explained to the patient. Questions regarding the procedure were encouraged and answered. The patient understands and consents to the procedure. Survey ultrasound of the abdomen was performed and an appropriate skin entry site in the right lateral abdomen was selected. Skin site was marked, prepped with chlorhexadine, draped in usual sterile fashion, and infiltrated locally with 1% lidocaine. A 32F multisidehole Yueh sheath needle was advanced into the peritoneal space until fluid could be aspirated. The sheath was advanced and the needle removed. 7.3 L of clear yellow ascites were aspirated. The patient tolerated the procedure well, without immediate complication. COMPLICATIONS: COMPLICATIONS none IMPRESSION: Technically successful ultrasound guided paracentesis, removing 7.3 L ascites. Electronically  Signed   By: Lucrezia Europe M.D.   On: 02/22/2017 13:45   Dg Chest Port 1 View  Result Date: 02/22/2017 CLINICAL DATA:  Weakness, lethargy, malaise. Unable to tolerate paracentesis today. History of CHF, cirrhosis, diabetes, coronary artery disease, current smoker. EXAM: PORTABLE CHEST 1 VIEW COMPARISON:  Portable chest x-ray of February 19, 2017 FINDINGS: The lungs are adequately inflated. There is no focal infiltrate. There is no pleural effusion. The cardiac silhouette is mildly enlarged. The central pulmonary vascularity is prominent but there is no interstitial edema. External pacemaker defibrillator pads are present. There is calcification in the wall of the aortic arch. The sternal wires are intact. IMPRESSION: Cardiomegaly and central pulmonary vascular congestion without  pulmonary edema. No acute pneumonia. Thoracic aortic atherosclerosis. Electronically Signed   By: David  Martinique M.D.   On: 02/22/2017 14:45    Scheduled Meds: . aspirin EC  81 mg Oral Daily  . atorvastatin  40 mg Oral Daily  . b complex-vitamin c-folic acid  1 tablet Oral Daily  . calcium gluconate  1 g Intravenous Once  . ferrous sulfate  325 mg Oral TID  . furosemide  80 mg Oral Daily  . heparin  5,000 Units Subcutaneous Q8H  . labetalol  100 mg Oral Daily  . lactulose  20 g Oral TID  . [START ON 02/24/2017] levofloxacin  500 mg Oral Q48H  . mometasone-formoterol  2 puff Inhalation BID  . nicotine  21 mg Transdermal Daily  . pantoprazole  40 mg Oral Daily  . patiromer  25.2 g Oral Once  . sevelamer carbonate  2,400 mg Oral TID WC  . sodium chloride flush  3 mL Intravenous Q12H  . tiotropium  18 mcg Inhalation Daily   Continuous Infusions: . sodium chloride    . sodium chloride    . sodium chloride Stopped (02/22/17 1700)    Assessment/Plan:  1. Hyperkalemia.  Nephrology to do dialysis today on first shift.  Veltassa ordered.  Calcium gluconate ordered.  Likely this is secondary to missed dialysis  session. 2. Hypocalcemia.  Unclear etiology but could be secondary to missed dialysis session.  Check a TSH 3. End-stage renal disease on dialysis.  Urgent dialysis this morning secondary to hyperkalemia 4. Liver cirrhosis and ascites.  On Lasix and lactulose 5. Wheeze with COPD.  Continue nebulizers and switch inhaler to budesonide nebulizer while here.  Try to hold off on steroids. 6. Hyperlipidemia on Lipitor 7. Tobacco abuse on nicotine patch 8. Essential hypertension on labetalol 9. Chronic diastolic congestive heart failure on dialysis to manage fluid 10. Chronic iron deficiency anemia.  Previous endoscopies showed AVMs in duodenum 11. Weakness.  Physical therapy evaluation after potassium better with dialysis today  Code Status:     Code Status Orders  (From admission, onward)        Start     Ordered   02/22/17 1721  Full code  Continuous     02/22/17 1721    Code Status History    Date Active Date Inactive Code Status Order ID Comments User Context   01/22/2017 23:37 01/25/2017 15:22 Full Code [id number]  Gorden Harms, MD Inpatient   01/07/2017 14:45 01/13/2017 18:12 Full Code [id number]  Idelle Crouch, MD Inpatient   12/22/2016 14:31 12/24/2016 18:04 Full Code [id number]  Hillary Bow, MD ED   12/20/2016 20:20 12/21/2016 18:53 Full Code [id number]  Demetrios Loll, MD Inpatient   11/26/2016 10:30 11/28/2016 15:22 Full Code [id number]  Henreitta Leber, MD Inpatient   11/15/2016 19:30 11/16/2016 18:22 Full Code [id number]  Fritzi Mandes, MD Inpatient   10/29/2016 14:53 10/30/2016 15:06 Full Code [id number]  Loletha Grayer, MD ED   09/15/2016 17:57 09/16/2016 18:17 Full Code [id number]  Dustin Flock, MD ED   08/16/2016 09:45 08/18/2016 18:16 Full Code [id number]  Hillary Bow, MD ED   08/09/2016 16:59 08/12/2016 20:34 Full Code [id number]  Fritzi Mandes, MD Inpatient   07/03/2016 21:54 07/05/2016 21:22 Full Code [id number]  Demetrios Loll, MD Inpatient   06/24/2016 04:23 06/26/2016 15:46 Full Code  [id number]  Saundra Shelling, MD Inpatient   06/14/2016 14:53 06/19/2016 21:04 Full Code [id number]  Hillary Bow, MD ED   05/31/2016 06:42 06/01/2016 21:30 Full Code [id number]  Saundra Shelling, MD Inpatient   05/05/2016 23:09 05/10/2016 21:57 Full Code [id number]  Vianne Bulls, MD Inpatient   04/16/2016 03:56 04/17/2016 18:27 Full Code [id number]  Saundra Shelling, MD Inpatient   01/19/2016 15:13 01/22/2016 14:14 Full Code [id number]  Loletha Grayer, MD ED   09/24/2015 10:33 09/25/2015 13:31 Full Code [id number]  Loletha Grayer, MD ED   05/16/2015 14:17 05/17/2015 20:57 Full Code [id number]  Idelle Crouch, MD Inpatient   04/30/2015 20:08 05/08/2015 19:40 Full Code [id number]  Vaughan Basta, MD Inpatient   04/06/2015 05:07 04/07/2015 17:36 Full Code [id number]  Lance Coon, MD Inpatient   03/23/2015 11:07 03/27/2015 12:46 Full Code [id number]  Bettey Costa, MD Inpatient   06/19/2013 11:32 06/21/2013 17:49 Full Code [id number]  Cristal Ford, DO Inpatient   06/16/2013 17:26 06/19/2013 11:32 Full Code [id number]  Margarita Mail, PA-C ED   06/05/2013 15:33 06/06/2013 18:56 Full Code [id number]  Theodoro Kos, DO Inpatient   06/02/2013 09:01 06/05/2013 15:33 Full Code [id number]  Velvet Bathe, MD Inpatient   06/02/2013 08:40 06/02/2013 09:01 Full Code [id number]  Velvet Bathe, MD Inpatient      Disposition Plan: To be determined  Consultants:  Nephrology  Time spent: 28 minutes  [place]

## 2017-02-23 NOTE — Care Management (Signed)
Patient with 10 admissions within last 6 months.  Missed two dialysis appointments due to weather and transport van break down.  Had potassium of 7.5 on admission.  Paracentesis with removal of 7.3 liters of fluid. Reaching out to HD coordinator regarding patient's overall compliance with dialysis appointments

## 2017-02-23 NOTE — Progress Notes (Signed)
Pre HD assessment  

## 2017-02-23 NOTE — Progress Notes (Signed)
[facility]  ROUNDING NOTE   Subjective:  Patient seen and evaluated during hemodialysis. He appears to be tolerating well. Patient found to have hyperkalemia today. He is on a 1K bath.  Objective:  Vital signs in last 24 hours:  Temp:  [97.8 F (36.6 C)-98.5 F (36.9 C)] 98.3 F (36.8 C) (12/13 1031) Pulse Rate:  [67-77] 74 (12/13 1215) Resp:  [11-25] 13 (12/13 1215) BP: (98-146)/(54-109) 106/56 (12/13 1215) SpO2:  [95 %-100 %] 96 % (12/13 1215) Weight:  [72.8 kg (160 lb 8 oz)-79.4 kg (175 lb)] 79.2 kg (174 lb 9.7 oz) (12/13 1031)  Weight change:  Filed Weights   02/22/17 1730 02/23/17 0355 02/23/17 1031  Weight: 72.8 kg (160 lb 8 oz) 74.9 kg (165 lb 1.6 oz) 79.2 kg (174 lb 9.7 oz)    Intake/Output: I/O last 3 completed shifts: In: 46 [P.O.:420] Out: 0    Intake/Output this shift:  Total I/O In: 120 [P.O.:120] Out: -   Physical Exam: General: No acute distress  Head: Normocephalic, atraumatic. Moist oral mucosal membranes  Eyes: Anicteric  Neck: Supple, trachea midline  Lungs:  Clear to auscultation, normal effort  Heart: S1S2 no rubs  Abdomen:  Soft, nontender, distension noted  Extremities: trace peripheral edema.  Neurologic: Awake, alert, following commands  Skin: No lesions  Access: RUE AVF    Basic Metabolic Panel: Recent Labs  Lab 02/22/17 1403 02/22/17 2039 02/23/17 0534  NA 137  --  133*  K 5.2*  --  >7.5*  CL 84*  --  99*  CO2 17*  --  24  GLUCOSE 84  --  107*  BUN 41*  --  63*  CREATININE 6.20*  --  10.29*  CALCIUM 4.9* 8.2* 7.9*  MG 3.1*  --   --   PHOS 2.1* 4.3  --     Liver Function Tests: Recent Labs  Lab 02/22/17 1403  AST 18  ALT 12*  ALKPHOS 103  BILITOT 0.5  PROT >12.0*  ALBUMIN 11.4*   No results for input(s): LIPASE, AMYLASE in the last 168 hours. No results for input(s): AMMONIA in the last 168 hours.  CBC: Recent Labs  Lab 02/22/17 1403 02/23/17 0534  WBC 4.7 7.3  HGB 7.6* 10.9*  HCT 25.0*  35.2*  MCV 88.7 87.6  PLT 196 264    Cardiac Enzymes: Recent Labs  Lab 02/22/17 1403  TROPONINI <0.03    BNP: Invalid input(s): POCBNP  CBG: Recent Labs  Lab 02/22/17 1326  [place] 107*    Microbiology: Results for orders placed or performed during the hospital encounter of 02/22/17  Blood culture (routine x 2)     Status: None (Preliminary result)   Collection Time: 02/22/17  2:03 PM  Result Value Ref Range Status   Specimen Description BLOOD LFOA  Final   Special Requests   Final    BOTTLES DRAWN AEROBIC AND ANAEROBIC Blood Culture adequate volume   Culture NO GROWTH < 24 HOURS  Final   Report Status PENDING  Incomplete  Blood culture (routine x 2)     Status: None (Preliminary result)   Collection Time: 02/22/17  2:03 PM  Result Value Ref Range Status   Specimen Description BLOOD LEFT HAND  Final   Special Requests   Final    BOTTLES DRAWN AEROBIC AND ANAEROBIC Blood Culture adequate volume   Culture NO GROWTH < 24 HOURS  Final   Report Status PENDING  Incomplete  MRSA PCR Screening  Status: None   Collection Time: 02/22/17  8:47 PM  Result Value Ref Range Status   MRSA by PCR NEGATIVE NEGATIVE Final    Comment:        The GeneXpert MRSA Assay (FDA approved for NASAL specimens only), is one component of a comprehensive MRSA colonization surveillance program. It is not intended to diagnose MRSA infection nor to guide or monitor treatment for MRSA infections.     Coagulation Studies: No results for input(s): LABPROT, INR in the last 72 hours.  Urinalysis: No results for input(s): COLORURINE, LABSPEC, PHURINE, GLUCOSEU, HGBUR, BILIRUBINUR, KETONESUR, PROTEINUR, UROBILINOGEN, NITRITE, LEUKOCYTESUR in the last 72 hours.  Invalid input(s): APPERANCEUR    Imaging: US Paracentesis  Result Date: 02/22/2017 CLINICAL DATA:  Ascites, recurrent.  End-stage renal disease. EXAM: ULTRASOUND GUIDED PARACENTESIS TECHNIQUE: The procedure, risks (including but  not limited to bleeding, infection, organ damage ), benefits, and alternatives were explained to the patient. Questions regarding the procedure were encouraged and answered. The patient understands and consents to the procedure. Survey ultrasound of the abdomen was performed and an appropriate skin entry site in the right lateral abdomen was selected. Skin site was marked, prepped with chlorhexadine, draped in usual sterile fashion, and infiltrated locally with 1% lidocaine. A 49F multisidehole Yueh sheath needle was advanced into the peritoneal space until fluid could be aspirated. The sheath was advanced and the needle removed. 7.3 L of clear yellow ascites were aspirated. The patient tolerated the procedure well, without immediate complication. COMPLICATIONS: COMPLICATIONS none IMPRESSION: Technically successful ultrasound guided paracentesis, removing 7.3 L ascites. Electronically Signed   By: Lucrezia Europe M.D.   On: 02/22/2017 13:45   Dg Chest Port 1 View  Result Date: 02/22/2017 CLINICAL DATA:  Weakness, lethargy, malaise. Unable to tolerate paracentesis today. History of CHF, cirrhosis, diabetes, coronary artery disease, current smoker. EXAM: PORTABLE CHEST 1 VIEW COMPARISON:  Portable chest x-ray of February 19, 2017 FINDINGS: The lungs are adequately inflated. There is no focal infiltrate. There is no pleural effusion. The cardiac silhouette is mildly enlarged. The central pulmonary vascularity is prominent but there is no interstitial edema. External pacemaker defibrillator pads are present. There is calcification in the wall of the aortic arch. The sternal wires are intact. IMPRESSION: Cardiomegaly and central pulmonary vascular congestion without pulmonary edema. No acute pneumonia. Thoracic aortic atherosclerosis. Electronically Signed   By: David  Martinique M.D.   On: 02/22/2017 14:45     Medications:   . sodium chloride    . sodium chloride    . sodium chloride Stopped (02/22/17 1700)   .  albuterol  2.5 mg Inhalation Q6H  . aspirin EC  81 mg Oral Daily  . atorvastatin  40 mg Oral Daily  . b complex-vitamin c-folic acid  1 tablet Oral Daily  . budesonide (PULMICORT) nebulizer solution  0.5 mg Nebulization BID  . ferrous sulfate  325 mg Oral TID  . furosemide  80 mg Oral Daily  . heparin  5,000 Units Subcutaneous Q8H  . labetalol  100 mg Oral Daily  . lactulose  20 g Oral TID  . [START ON 02/24/2017] levofloxacin  500 mg Oral Q48H  . nicotine  21 mg Transdermal Daily  . pantoprazole  40 mg Oral Daily  . sevelamer carbonate  2,400 mg Oral TID WC  . sodium chloride flush  3 mL Intravenous Q12H  . tiotropium  18 mcg Inhalation Daily   sodium chloride, sodium chloride, sodium chloride, acetaminophen **OR** acetaminophen, alteplase, alum & mag  hydroxide-simeth, gabapentin, heparin, lidocaine (PF), lidocaine-prilocaine, nitroGLYCERIN, ondansetron **OR** ondansetron (ZOFRAN) IV, oxyCODONE-acetaminophen, pentafluoroprop-tetrafluoroeth, sodium chloride flush  Assessment/ Plan:  56 y.o. 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 End-stage renal disease. 2. Hyperkalemia 3. Hypertension 4. Anemia of chronic kidney disease, anemia secondary to GI bleed as well.   5. Ascites secondary to end-stage liver disease and cirrhosis s/p paracentesis 12/12 6. Secondary hyperparathyroidism.  Plan: Patient seen and evaluated during hemodialysis.  He appears to be tolerating well.  Patient placed on 1K bath for hyperkalemia.  We plan to complete hemodialysis today.  We will plan to maintain the patient on Renvela 2400 mg p.o. 3 times daily with meals for his underlying secondary hyperparathyroidism.  Hemoglobin currently 10.9.   Hold off on Epogen.    LOS: 1 Orry Sigl 12/13/201812:50 PM

## 2017-02-23 NOTE — Progress Notes (Signed)
Post HD assessment  

## 2017-02-23 NOTE — Progress Notes (Signed)
CRITICAL VALUE ALERT  Critical Value:  K 7.7  Date & Time Notied: 02/23/17 0740  Provider Notified: Dr. Leslye Peer  Orders Received/Actions taken: Calcium Gluconate inj 10% .Marland Kitchen Per Dr. Leslye Peer give if pt does not go to HD this morning. Awaiting for Dr. Holley Raring to call back. VSS stable, no changes in tele. Will continue to monitor.

## 2017-02-23 NOTE — Progress Notes (Signed)
Pt off the floor for HD.

## 2017-02-23 NOTE — Progress Notes (Signed)
Pt's K is 7.7 Dr. Holley Raring notified. Orders to give Veltassa 25.2 mg. Pt will go to dialysis first thing this morning.Will continue to monitor.

## 2017-02-23 NOTE — Progress Notes (Signed)
PT Cancellation Note  Patient Details Name: Stanley [name] MRN: [id number] DOB: 01/22/1961   Cancelled Treatment:    Reason Eval/Treat Not Completed: Patient not medically ready.  Pt's potassium noted to be >7.5 today.  Per PT guidelines for critically elevated potassium, PT currently contraindicated.  Per notes, plan for dialysis.  Will re-attempt PT evaluation at a later date/time as medically appropriate.  Leitha Bleak, PT 02/23/17, 9:27 AM [phone]

## 2017-02-23 NOTE — Progress Notes (Signed)
Pt back from HD, vss.

## 2017-02-23 NOTE — Progress Notes (Signed)
HD tx end  

## 2017-02-23 NOTE — Progress Notes (Signed)
HD tx start 

## 2017-02-23 NOTE — Plan of Care (Signed)
  Progressing Education: Knowledge of General Education information will improve 02/23/2017 1737 - Progressing by Rolley Sims, RN Health Behavior/Discharge Planning: Ability to manage health-related needs will improve 02/23/2017 1737 - Progressing by Rolley Sims, RN

## 2017-02-24 LAB — PARATHYROID HORMONE, INTACT (NO CA): PTH: 144 pg/mL — ABNORMAL HIGH (ref 15–65)

## 2017-02-24 LAB — COMPREHENSIVE METABOLIC PANEL
ALBUMIN: 2.2 g/dL — AB (ref 3.5–5.0)
ALT: 21 U/L (ref 17–63)
ALT: 24 U/L (ref 17–63)
ANION GAP: 6 (ref 5–15)
AST: 26 U/L (ref 15–41)
AST: 26 U/L (ref 15–41)
Albumin: 2.3 g/dL — ABNORMAL LOW (ref 3.5–5.0)
Alkaline Phosphatase: 155 U/L — ABNORMAL HIGH (ref 38–126)
Alkaline Phosphatase: 163 U/L — ABNORMAL HIGH (ref 38–126)
Anion gap: 6 (ref 5–15)
BILIRUBIN TOTAL: 0.5 mg/dL (ref 0.3–1.2)
BILIRUBIN TOTAL: 0.5 mg/dL (ref 0.3–1.2)
BUN: 24 mg/dL — AB (ref 6–20)
BUN: 26 mg/dL — AB (ref 6–20)
CHLORIDE: 100 mmol/L — AB (ref 101–111)
CHLORIDE: 99 mmol/L — AB (ref 101–111)
CO2: 31 mmol/L (ref 22–32)
CO2: 32 mmol/L (ref 22–32)
CREATININE: 5.53 mg/dL — AB (ref 0.61–1.24)
Calcium: 7.4 mg/dL — ABNORMAL LOW (ref 8.9–10.3)
Calcium: 7.6 mg/dL — ABNORMAL LOW (ref 8.9–10.3)
Creatinine, Ser: 5.3 mg/dL — ABNORMAL HIGH (ref 0.61–1.24)
GFR calc Af Amer: 12 mL/min — ABNORMAL LOW (ref >60)
GFR, EST AFRICAN AMERICAN: 13 mL/min — AB (ref >60)
GFR, EST NON AFRICAN AMERICAN: 10 mL/min — AB (ref >60)
GFR, EST NON AFRICAN AMERICAN: 11 mL/min — AB (ref >60)
GLUCOSE: 123 mg/dL — AB (ref 65–99)
Glucose, Bld: 109 mg/dL — ABNORMAL HIGH (ref 65–99)
POTASSIUM: 4.4 mmol/L (ref 3.5–5.1)
POTASSIUM: 4.5 mmol/L (ref 3.5–5.1)
Sodium: 137 mmol/L (ref 135–145)
Sodium: 137 mmol/L (ref 135–145)
TOTAL PROTEIN: 5.3 g/dL — AB (ref 6.5–8.1)
Total Protein: 5.2 g/dL — ABNORMAL LOW (ref 6.5–8.1)

## 2017-02-24 LAB — MAGNESIUM: MAGNESIUM: 2.9 mg/dL — AB (ref 1.7–2.4)

## 2017-02-24 LAB — CBC
HEMATOCRIT: 32.6 % — AB (ref 40.0–52.0)
Hemoglobin: 10.2 g/dL — ABNORMAL LOW (ref 13.0–18.0)
MCH: 27 pg (ref 26.0–34.0)
MCHC: 31.3 g/dL — AB (ref 32.0–36.0)
MCV: 86.4 fL (ref 80.0–100.0)
PLATELETS: 243 10*3/uL (ref 150–440)
RBC: 3.77 MIL/uL — ABNORMAL LOW (ref 4.40–5.90)
RDW: 20.6 % — AB (ref 11.5–14.5)
WBC: 4.7 10*3/uL (ref 3.8–10.6)

## 2017-02-24 MED ORDER — MAGNESIUM SULFATE 2 GM/50ML IV SOLN
2.0000 g | Freq: Once | INTRAVENOUS | Status: AC
  Administered 2017-02-24: 2 g via INTRAVENOUS
  Filled 2017-02-24: qty 50

## 2017-02-24 NOTE — Evaluation (Signed)
Physical Therapy Evaluation Patient Details Name: Stanley [name] MRN: [id number] DOB: 05-Apr-1960 Today's Date: 02/24/2017   History of Present Illness  Pt is a 56 y.o. male presenting to hospital with c/o weakness after paracentesis 02/22/17; pt also missed 2 dialysis sessions secondary to weather.  Pt admitted to hospital with hypocalcemia, hyperkalemia, and ESRD on HD.  PMH includes alcohol abuse, CHF, ESRD, diabetic peripheral neuropathy, htn, COPD, CABG.  Clinical Impression  Prior to hospital admission, pt was independent with functional mobility.  Pt lives alone in 1 level home with 2 steps to enter with L railing.  Currently pt is independent with bed mobility, transfers, ambulation around nursing loop, and modified independent navigating stairs with railing.  Upon hospital discharge, recommend pt discharge to home.  No acute PT needs identified in hospital or upon hospital discharge.  Will discharge pt from PT in house and complete current PT order.    Follow Up Recommendations No PT follow up    Equipment Recommendations  None recommended by PT    Recommendations for Other Services       Precautions / Restrictions Precautions Precautions: None Restrictions Weight Bearing Restrictions: No      Mobility  Bed Mobility Overal bed mobility: Independent             General bed mobility comments: Supine to/from sit without any difficulties.  Transfers Overall transfer level: Independent Equipment used: None             General transfer comment: steady strong transfers  Ambulation/Gait Ambulation/Gait assistance: Independent Ambulation Distance (Feet): 200 Feet Assistive device: None Gait Pattern/deviations: WFL(Within Functional Limits)   Gait velocity interpretation: at or above normal speed for age/gender General Gait Details: steady gait  Stairs Stairs: Yes Stairs assistance: Modified independent (Device/Increase time) Stair Management: One rail  Left Number of Stairs: 3 General stair comments: steady navigating stairs  Wheelchair Mobility    Modified Rankin (Stroke Patients Only)       Balance Overall balance assessment: Independent(No loss of balance with ambulation and head turns R/L/up/down, increasing/decreasing speed, and turning and stopping)                                           Pertinent Vitals/Pain Pain Assessment: No/denies pain  Vitals (HR and O2 on room air) stable and WFL throughout treatment session.    Home Living Family/patient expects to be discharged to:: Private residence Living Arrangements: Alone   Type of Home: House Home Access: Stairs to enter Entrance Stairs-Rails: Left Entrance Stairs-Number of Steps: 2 Home Layout: One level Home Equipment: None      Prior Function Level of Independence: Independent         Comments: Pt does not drive but is independent with all ADLs, IADLs, and all aspects of mobility.  He does report 1 fall >6 months ago falling in bathtub.     Hand Dominance        Extremity/Trunk Assessment   Upper Extremity Assessment Upper Extremity Assessment: Overall WFL for tasks assessed    Lower Extremity Assessment Lower Extremity Assessment: Overall WFL for tasks assessed       Communication   Communication: No difficulties  Cognition Arousal/Alertness: Awake/alert Behavior During Therapy: WFL for tasks assessed/performed Overall Cognitive Status: Within Functional Limits for tasks assessed  General Comments General comments (skin integrity, edema, etc.): Pt resting in bed upon PT arrival.  Nursing cleared pt for participation in physical therapy.  Pt agreeable to PT session.    Exercises     Assessment/Plan    PT Assessment Patent does not need any further PT services  PT Problem List         PT Treatment Interventions      PT Goals (Current goals can be found in  the Care Plan section)  Acute Rehab PT Goals Patient Stated Goal: to go home PT Goal Formulation: With patient Time For Goal Achievement: 03/10/17 Potential to Achieve Goals: Good    Frequency     Barriers to discharge        Co-evaluation               AM-PAC PT "6 Clicks" Daily Activity  Outcome Measure Difficulty turning over in bed (including adjusting bedclothes, sheets and blankets)?: None Difficulty moving from lying on back to sitting on the side of the bed? : None Difficulty sitting down on and standing up from a chair with arms (e.g., wheelchair, bedside commode, etc,.)?: None Help needed moving to and from a bed to chair (including a wheelchair)?: None Help needed walking in hospital room?: None Help needed climbing 3-5 steps with a railing? : None 6 Click Score: 24    End of Session Equipment Utilized During Treatment: Gait belt Activity Tolerance: Patient tolerated treatment well Patient left: in bed;with bed alarm set;with call bell/phone within reach Nurse Communication: Mobility status;Precautions PT Visit Diagnosis: Muscle weakness (generalized) (M62.81)    Time: 3335-4562 PT Time Calculation (min) (ACUTE ONLY): 13 min   Charges:   PT Evaluation $PT Eval Low Complexity: 1 Low     PT G Codes:   PT G-Codes **NOT FOR INPATIENT CLASS** Functional Assessment Tool Used: AM-PAC 6 Clicks Basic Mobility Functional Limitation: Mobility: Walking and moving around Mobility: Walking and Moving Around Current Status (B6389): 0 percent impaired, limited or restricted Mobility: Walking and Moving Around Goal Status (H7342): 0 percent impaired, limited or restricted Mobility: Walking and Moving Around Discharge Status (A7681): 0 percent impaired, limited or restricted    Leitha Bleak, PT 02/24/17, 2:03 PM [phone]

## 2017-02-24 NOTE — Progress Notes (Signed)
CCMD reports 16 beat run V-tach. MD Jannifer Franklin made aware. Stat magnesium and CBC ordered. Pt asymptomatic.Stanley [name]

## 2017-02-24 NOTE — Care Management (Signed)
CM informed that patient has "lost his dialysis slot" due to behavior issues.  He signed out AMA  even after concerns were discussed with him by attending

## 2017-02-24 NOTE — Progress Notes (Signed)
Patient left hospital AMA approximately an hour ago. Brought back to floor to have sign form. I signed form as well. Filed. Wenda Low [facility]

## 2017-02-24 NOTE — Discharge Summary (Signed)
[facility] NAME: Trinity Hyland    MR#:  [id number]  DATE OF BIRTH:  04-25-1960  DATE OF ADMISSION:  02/22/2017 ADMITTING PHYSICIAN: Dustin Flock, MD  DATE OF signing out [place]: 02/24/2017  PRIMARY CARE PHYSICIAN: Revelo, Elyse Jarvis, MD    ADMISSION DIAGNOSIS:  Hypocalcemia [E83.51]  DISCHARGE DIAGNOSIS:  Active Problems:   Hypocalcemia   SECONDARY DIAGNOSIS:   Past Medical History:  Diagnosis Date  . Alcohol abuse   . CHF (congestive heart failure) ([place])   . Cirrhosis ([place])   . Coronary artery disease 2009  . Diabetic peripheral neuropathy ([place])   . Drug abuse ([place])   . End stage renal disease on dialysis [facility]) NEPHROLOGIST-   DR [facility]  IN [place]   HEMODIALYSIS --   TUES/  THURS/  SAT  . GERD (gastroesophageal reflux disease)   . Hyperlipidemia   . Hypertension   . PAD (peripheral artery disease) ([place])   . Renal insufficiency    Per pt, 32 oz fluid restriction per day  . S/P triple vessel bypass 06/09/2016   2009ish  . Suicidal ideation    & HOMICIDAL IDEATION --  06-16-2013   ADMITTED TO [place] COURSE:   1.  Hyperkalemia.  Patient had dialysis yesterday.  And hyperkalemia has resolved. 2.  Hypocalcemia.  Unclear if this was an error because calcium did come up. 3.  [name].  Urgent dialysis yesterday morning secondary to hyperkalemia.  Case discussed with Dr. Holley Raring nephrology.  Because of some behavioral issues I cannot confirm that he still has a dialysis slot.  The patient signed out [place].  The patient states he will be okay.  He states he will missed dialysis on Saturday and he will talk to them on Tuesday.  I cannot safely discharge this patient home without knowing that he has a dialysis slot.  Patient still signed out AMA. 4.  Liver cirrhosis and ascites.  Gets intermittent paracentesis as outpatient 5.   Wheeze with COPD.  Nebulizers help out with his wheezing.  Lungs are clear upon discharge 6.  Hyperlipidemia on Lipitor 7.  Essential hypertension on labetalol 8.  Chronic diastolic congestive heart failure on dialysis to manage fluid 9.  Chronic iron deficiency anemia 10.  Weakness.  Physical therapy stated he has no needs.  DISCHARGE CONDITIONS:   Patient's condition is okay but he does not have a confirmed dialysis slot which makes his condition guarded if he cannot obtain dialysis  CONSULTS OBTAINED:  Treatment Team:  Anthonette Legato, MD  DRUG ALLERGIES:  No Known Allergies  DISCHARGE MEDICATIONS:   Allergies as of 02/24/2017   No Known Allergies     Medication List    TAKE these medications   albuterol 108 (90 Base) MCG/ACT inhaler Commonly known as:  PROVENTIL HFA;VENTOLIN HFA Inhale 2 puffs into the lungs every 6 (six) hours as needed for wheezing or shortness of breath.   alum & mag hydroxide-simeth 200-200-20 MG/5ML suspension Commonly known as:  MAALOX/MYLANTA Take 15 mLs every 4 (four) hours as needed by mouth for indigestion or heartburn.   aspirin 81 MG EC tablet Take 81 mg by mouth daily.   atorvastatin 40 MG tablet Commonly known as:  LIPITOR Take 1 tablet (40 mg total) by mouth daily.   budesonide-formoterol 160-4.5 MCG/ACT inhaler Commonly known as:  SYMBICORT Inhale 2 puffs into the lungs daily.  DIALYVITE SUPREME D 3 MG Tabs Take 1 tablet (3 mg total) by mouth daily.   ferrous sulfate 325 (65 FE) MG tablet Take 1 tablet by mouth 3 (three) times daily.   furosemide 80 MG tablet Commonly known as:  LASIX Take 1 tablet (80 mg total) daily by mouth.   gabapentin 300 MG capsule Commonly known as:  NEURONTIN TAKE 1 CAPSULE BY MOUTH EVERY EVENING AND 1 EXTRA CAP ON DIALYSIS DAYS AS NEEDED FOR NERVE PAIN   ipratropium-albuterol 0.5-2.5 (3) MG/3ML Soln Commonly known as:  DUONEB Take 3 mLs by nebulization every 6 (six) hours as needed.    labetalol 100 MG tablet Commonly known as:  NORMODYNE Take 1 tablet (100 mg total) by mouth daily.   lactulose 10 GM/15ML solution Commonly known as:  CHRONULAC Take 30 mLs (20 g total) by mouth 3 (three) times daily.   levofloxacin 750 MG tablet Commonly known as:  LEVAQUIN Take 1 tablet (750 mg total) by mouth daily for 6 days.   nicotine 21 mg/24hr patch Commonly known as:  NICODERM CQ - dosed in mg/24 hours Place 1 patch (21 mg total) onto the skin daily.   nitroGLYCERIN 0.4 MG SL tablet Commonly known as:  NITROSTAT Place 1 tablet (0.4 mg total) under the tongue every 5 (five) minutes as needed.   pantoprazole 40 MG tablet Commonly known as:  PROTONIX Take 1 tablet (40 mg total) by mouth daily.   sevelamer carbonate 800 MG tablet Commonly known as:  RENVELA Take 3 tablets (2,400 mg total) by mouth 3 (three) times daily with meals.   Spacer/Aero Chamber Mouthpiece Misc 1 Units by Does not apply route every 4 (four) hours as needed (wheezing).   tiotropium 18 MCG inhalation capsule Commonly known as:  SPIRIVA HANDIHALER Place 1 capsule (18 mcg total) into inhaler and inhale daily.        DISCHARGE INSTRUCTIONS:   Since the patient signed out [place] no discharge instructions given   Today   CHIEF COMPLAINT:   Chief Complaint  Patient presents with  . Weakness    HISTORY OF PRESENT ILLNESS:  Dagmawi Venable  is a 56 y.o. male came in with weakness   VITAL SIGNS:  Blood pressure 115/71, pulse 78, temperature 98.2 F (36.8 C), temperature source Oral, resp. rate 18, height 6\' 3"  (1.905 m), weight 74.9 kg (165 lb 1.6 oz), SpO2 99 %.    PHYSICAL EXAMINATION:  GENERAL:  56 y.o.-year-old patient lying in the bed with no acute distress.  EYES: Pupils equal, round, reactive to light and accommodation. No scleral icterus. Extraocular muscles intact.  HEENT: Head atraumatic, normocephalic. Oropharynx and nasopharynx clear.  NECK:  Supple, no  jugular venous distention. No thyroid enlargement, no tenderness.  LUNGS: Normal breath sounds bilaterally, no wheezing, rales,rhonchi or crepitation. No use of accessory muscles of respiration.  CARDIOVASCULAR: S1, S2 normal.  No murmurs, rubs, or gallops.  ABDOMEN: Soft, non-tender, distended. Bowel sounds present. No organomegaly or mass.  EXTREMITIES: Trace edema, no cyanosis, or clubbing.  NEUROLOGIC: Cranial nerves II through XII are intact. Muscle strength 5/5 in all extremities. Sensation intact. Gait not checked.  PSYCHIATRIC: The patient is alert and oriented x 3.  SKIN: No obvious rash, lesion, or ulcer.   DATA REVIEW:   CBC Recent Labs  Lab 02/24/17 0621  WBC 4.7  HGB 10.2*  HCT 32.6*  PLT 243    Chemistries  Recent Labs  Lab 02/24/17 0026 02/24/17 0621  NA 137  137  K 4.4 4.5  CL 99* 100*  CO2 32 31  GLUCOSE 109* 123*  BUN 24* 26*  CREATININE 5.30* 5.53*  CALCIUM 7.6* 7.4*  MG 2.9*  --   AST 26 26  ALT 24 21  ALKPHOS 163* 155*  BILITOT 0.5 0.5    Cardiac Enzymes Recent Labs  Lab 02/22/17 1403  TROPONINI <0.03    Microbiology Results  Results for orders placed or performed during the hospital encounter of 02/22/17  Blood culture (routine x 2)     Status: None (Preliminary result)   Collection Time: 02/22/17  2:03 PM  Result Value Ref Range Status   Specimen Description BLOOD LFOA  Final   Special Requests   Final    BOTTLES DRAWN AEROBIC AND ANAEROBIC Blood Culture adequate volume   Culture NO GROWTH 2 DAYS  Final   Report Status PENDING  Incomplete  Blood culture (routine x 2)     Status: None (Preliminary result)   Collection Time: 02/22/17  2:03 PM  Result Value Ref Range Status   Specimen Description BLOOD LEFT HAND  Final   Special Requests   Final    BOTTLES DRAWN AEROBIC AND ANAEROBIC Blood Culture adequate volume   Culture NO GROWTH 2 DAYS  Final   Report Status PENDING  Incomplete  MRSA PCR Screening     Status: None   Collection  Time: 02/22/17  8:47 PM  Result Value Ref Range Status   MRSA by PCR NEGATIVE NEGATIVE Final    Comment:        The GeneXpert MRSA Assay (FDA approved for NASAL specimens only), is one component of a comprehensive MRSA colonization surveillance program. It is not intended to diagnose MRSA infection nor to guide or monitor treatment for MRSA infections.     RADIOLOGY:  US Paracentesis  Result Date: 02/22/2017 CLINICAL DATA:  Ascites, recurrent.  End-stage renal disease. EXAM: ULTRASOUND GUIDED PARACENTESIS TECHNIQUE: The procedure, risks (including but not limited to bleeding, infection, organ damage ), benefits, and alternatives were explained to the patient. Questions regarding the procedure were encouraged and answered. The patient understands and consents to the procedure. Survey ultrasound of the abdomen was performed and an appropriate skin entry site in the right lateral abdomen was selected. Skin site was marked, prepped with chlorhexadine, draped in usual sterile fashion, and infiltrated locally with 1% lidocaine. A 31F multisidehole Yueh sheath needle was advanced into the peritoneal space until fluid could be aspirated. The sheath was advanced and the needle removed. 7.3 L of clear yellow ascites were aspirated. The patient tolerated the procedure well, without immediate complication. COMPLICATIONS: COMPLICATIONS none IMPRESSION: Technically successful ultrasound guided paracentesis, removing 7.3 L ascites. Electronically Signed   By: Lucrezia Europe M.D.   On: 02/22/2017 13:45   Dg Chest Port 1 View  Result Date: 02/22/2017 CLINICAL DATA:  Weakness, lethargy, malaise. Unable to tolerate paracentesis today. History of CHF, cirrhosis, diabetes, coronary artery disease, current smoker. EXAM: PORTABLE CHEST 1 VIEW COMPARISON:  Portable chest x-ray of February 19, 2017 FINDINGS: The lungs are adequately inflated. There is no focal infiltrate. There is no pleural effusion. The cardiac  silhouette is mildly enlarged. The central pulmonary vascularity is prominent but there is no interstitial edema. External pacemaker defibrillator pads are present. There is calcification in the wall of the aortic arch. The sternal wires are intact. IMPRESSION: Cardiomegaly and central pulmonary vascular congestion without pulmonary edema. No acute pneumonia. Thoracic aortic atherosclerosis. Electronically Signed   By:  David  Martinique M.D.   On: 02/22/2017 14:45     Patient told me what the management plan was and that he is leaving [place].  CODE STATUS:     Code Status Orders  (From admission, onward)        Start     Ordered   02/22/17 1721  Full code  Continuous     02/22/17 1721    Code Status History    Date Active Date Inactive Code Status Order ID Comments User Context   01/22/2017 23:37 01/25/2017 15:22 Full Code [id number]  Gorden Harms, MD Inpatient   01/07/2017 14:45 01/13/2017 18:12 Full Code [id number]  Idelle Crouch, MD Inpatient   12/22/2016 14:31 12/24/2016 18:04 Full Code [id number]  Hillary Bow, MD ED   12/20/2016 20:20 12/21/2016 18:53 Full Code [id number]  Demetrios Loll, MD Inpatient   11/26/2016 10:30 11/28/2016 15:22 Full Code [id number]  Henreitta Leber, MD Inpatient   11/15/2016 19:30 11/16/2016 18:22 Full Code [id number]  Fritzi Mandes, MD Inpatient   10/29/2016 14:53 10/30/2016 15:06 Full Code [id number]  Loletha Grayer, MD ED   09/15/2016 17:57 09/16/2016 18:17 Full Code [id number]  Dustin Flock, MD ED   08/16/2016 09:45 08/18/2016 18:16 Full Code [id number]  Hillary Bow, MD ED   08/09/2016 16:59 08/12/2016 20:34 Full Code [id number]  Fritzi Mandes, MD Inpatient   07/03/2016 21:54 07/05/2016 21:22 Full Code [id number]  Demetrios Loll, MD Inpatient   06/24/2016 04:23 06/26/2016 15:46 Full Code [id number]  Saundra Shelling, MD Inpatient   06/14/2016 14:53 06/19/2016 21:04 Full Code [id number]  Hillary Bow, MD ED   05/31/2016 06:42 06/01/2016 21:30 Full Code [id number]  Saundra Shelling, MD Inpatient   05/05/2016 23:09 05/10/2016 21:57 Full Code [id number]  Vianne Bulls, MD Inpatient   04/16/2016 03:56 04/17/2016 18:27 Full Code [id number]  Saundra Shelling, MD Inpatient   01/19/2016 15:13 01/22/2016 14:14 Full Code [id number]  Loletha Grayer, MD ED   09/24/2015 10:33 09/25/2015 13:31 Full Code [id number]  Loletha Grayer, MD ED   05/16/2015 14:17 05/17/2015 20:57 Full Code [id number]  Idelle Crouch, MD Inpatient   04/30/2015 20:08 05/08/2015 19:40 Full Code [id number]  Vaughan Basta, MD Inpatient   04/06/2015 05:07 04/07/2015 17:36 Full Code [id number]  Lance Coon, MD Inpatient   03/23/2015 11:07 03/27/2015 12:46 Full Code [id number]  Bettey Costa, MD Inpatient   06/19/2013 11:32 06/21/2013 17:49 Full Code [id number]  Cristal Ford, DO Inpatient   06/16/2013 17:26 06/19/2013 11:32 Full Code [id number]  Margarita Mail, PA-C ED   06/05/2013 15:33 06/06/2013 18:56 Full Code [id number]  Theodoro Kos, DO Inpatient   06/02/2013 09:01 06/05/2013 15:33 Full Code [id number]  Velvet Bathe, MD Inpatient   06/02/2013 08:40 06/02/2013 09:01 Full Code [id number]  Velvet Bathe, MD Inpatient      TOTAL TIME TAKING CARE OF THIS PATIENT: 20 minutes.    Loletha Grayer M.D on 02/24/2017 at 10:08 AM  Between 7am to 6pm - Pager - [phone]  After 6pm go to [email or web address] - password [email or web address]  [facility] Office  [phone]  CC: Primary care physician; [name], MD

## 2017-02-27 LAB — CULTURE, BLOOD (ROUTINE X 2)
CULTURE: NO GROWTH
Culture: NO GROWTH
SPECIAL REQUESTS: ADEQUATE
Special Requests: ADEQUATE

## 2017-02-28 ENCOUNTER — Other Ambulatory Visit: Payer: Self-pay

## 2017-02-28 ENCOUNTER — Inpatient Hospital Stay
Admission: EM | Admit: 2017-02-28 | Discharge: 2017-03-02 | DRG: 640 | Discharge status: Against Medical Advice | Payer: Medicare Other | Attending: Internal Medicine | Admitting: Internal Medicine

## 2017-02-28 ENCOUNTER — Encounter: Payer: Self-pay | Admitting: *Deleted

## 2017-02-28 ENCOUNTER — Emergency Department: Payer: Medicare Other

## 2017-02-28 DIAGNOSIS — K7031 Alcoholic cirrhosis of liver with ascites: Secondary | ICD-10-CM

## 2017-02-28 DIAGNOSIS — E1151 Type 2 diabetes mellitus with diabetic peripheral angiopathy without gangrene: Secondary | ICD-10-CM | POA: Diagnosis present

## 2017-02-28 DIAGNOSIS — K7460 Unspecified cirrhosis of liver: Secondary | ICD-10-CM | POA: Diagnosis present

## 2017-02-28 DIAGNOSIS — R531 Weakness: Secondary | ICD-10-CM

## 2017-02-28 DIAGNOSIS — N19 Unspecified kidney failure: Secondary | ICD-10-CM | POA: Diagnosis not present

## 2017-02-28 DIAGNOSIS — N2581 Secondary hyperparathyroidism of renal origin: Secondary | ICD-10-CM | POA: Diagnosis present

## 2017-02-28 DIAGNOSIS — E1122 Type 2 diabetes mellitus with diabetic chronic kidney disease: Secondary | ICD-10-CM | POA: Diagnosis present

## 2017-02-28 DIAGNOSIS — Z992 Dependence on renal dialysis: Secondary | ICD-10-CM

## 2017-02-28 DIAGNOSIS — D631 Anemia in chronic kidney disease: Secondary | ICD-10-CM | POA: Diagnosis present

## 2017-02-28 DIAGNOSIS — Q8781 Alport syndrome: Secondary | ICD-10-CM | POA: Diagnosis not present

## 2017-02-28 DIAGNOSIS — N186 End stage renal disease: Secondary | ICD-10-CM

## 2017-02-28 DIAGNOSIS — R5381 Other malaise: Secondary | ICD-10-CM | POA: Diagnosis not present

## 2017-02-28 DIAGNOSIS — Z951 Presence of aortocoronary bypass graft: Secondary | ICD-10-CM | POA: Diagnosis not present

## 2017-02-28 DIAGNOSIS — I5030 Unspecified diastolic (congestive) heart failure: Secondary | ICD-10-CM | POA: Diagnosis not present

## 2017-02-28 DIAGNOSIS — R188 Other ascites: Secondary | ICD-10-CM

## 2017-02-28 DIAGNOSIS — I2510 Atherosclerotic heart disease of native coronary artery without angina pectoris: Secondary | ICD-10-CM | POA: Diagnosis present

## 2017-02-28 DIAGNOSIS — F1010 Alcohol abuse, uncomplicated: Secondary | ICD-10-CM | POA: Diagnosis present

## 2017-02-28 DIAGNOSIS — E785 Hyperlipidemia, unspecified: Secondary | ICD-10-CM | POA: Diagnosis present

## 2017-02-28 DIAGNOSIS — K219 Gastro-esophageal reflux disease without esophagitis: Secondary | ICD-10-CM | POA: Diagnosis present

## 2017-02-28 DIAGNOSIS — J449 Chronic obstructive pulmonary disease, unspecified: Secondary | ICD-10-CM | POA: Diagnosis present

## 2017-02-28 DIAGNOSIS — R0989 Other specified symptoms and signs involving the circulatory and respiratory systems: Secondary | ICD-10-CM | POA: Diagnosis not present

## 2017-02-28 DIAGNOSIS — I132 Hypertensive heart and chronic kidney disease with heart failure and with stage 5 chronic kidney disease, or end stage renal disease: Secondary | ICD-10-CM | POA: Diagnosis present

## 2017-02-28 DIAGNOSIS — I10 Essential (primary) hypertension: Secondary | ICD-10-CM | POA: Diagnosis present

## 2017-02-28 DIAGNOSIS — F17210 Nicotine dependence, cigarettes, uncomplicated: Secondary | ICD-10-CM | POA: Diagnosis present

## 2017-02-28 DIAGNOSIS — F17200 Nicotine dependence, unspecified, uncomplicated: Secondary | ICD-10-CM | POA: Diagnosis not present

## 2017-02-28 DIAGNOSIS — K761 Chronic passive congestion of liver: Secondary | ICD-10-CM | POA: Diagnosis not present

## 2017-02-28 DIAGNOSIS — K7290 Hepatic failure, unspecified without coma: Secondary | ICD-10-CM | POA: Diagnosis present

## 2017-02-28 DIAGNOSIS — E875 Hyperkalemia: Secondary | ICD-10-CM | POA: Diagnosis not present

## 2017-02-28 DIAGNOSIS — I509 Heart failure, unspecified: Secondary | ICD-10-CM | POA: Diagnosis present

## 2017-02-28 DIAGNOSIS — I120 Hypertensive chronic kidney disease with stage 5 chronic kidney disease or end stage renal disease: Secondary | ICD-10-CM | POA: Diagnosis not present

## 2017-02-28 DIAGNOSIS — R400 Somnolence: Secondary | ICD-10-CM | POA: Diagnosis not present

## 2017-02-28 LAB — COMPREHENSIVE METABOLIC PANEL
ALT: 25 U/L (ref 17–63)
AST: 37 U/L (ref 15–41)
Albumin: 2.6 g/dL — ABNORMAL LOW (ref 3.5–5.0)
Alkaline Phosphatase: 169 U/L — ABNORMAL HIGH (ref 38–126)
Anion gap: 14 (ref 5–15)
BUN: 69 mg/dL — AB (ref 6–20)
CHLORIDE: 96 mmol/L — AB (ref 101–111)
CO2: 29 mmol/L (ref 22–32)
CREATININE: 10.3 mg/dL — AB (ref 0.61–1.24)
Calcium: 7.8 mg/dL — ABNORMAL LOW (ref 8.9–10.3)
GFR, EST AFRICAN AMERICAN: 6 mL/min — AB (ref >60)
GFR, EST NON AFRICAN AMERICAN: 5 mL/min — AB (ref >60)
Glucose, Bld: 114 mg/dL — ABNORMAL HIGH (ref 65–99)
POTASSIUM: 6 mmol/L — AB (ref 3.5–5.1)
Sodium: 139 mmol/L (ref 135–145)
Total Bilirubin: 0.3 mg/dL (ref 0.3–1.2)
Total Protein: 5.9 g/dL — ABNORMAL LOW (ref 6.5–8.1)

## 2017-02-28 LAB — CBC
HCT: 28.9 % — ABNORMAL LOW (ref 40.0–52.0)
Hemoglobin: 9 g/dL — ABNORMAL LOW (ref 13.0–18.0)
MCH: 26.7 pg (ref 26.0–34.0)
MCHC: 31.1 g/dL — ABNORMAL LOW (ref 32.0–36.0)
MCV: 86 fL (ref 80.0–100.0)
PLATELETS: 259 10*3/uL (ref 150–440)
RBC: 3.36 MIL/uL — ABNORMAL LOW (ref 4.40–5.90)
RDW: 20.1 % — AB (ref 11.5–14.5)
WBC: 5.9 10*3/uL (ref 3.8–10.6)

## 2017-02-28 LAB — TROPONIN I: TROPONIN I: 0.03 ng/mL — AB (ref <0.03)

## 2017-02-28 MED ORDER — LORAZEPAM 2 MG PO TABS: 0.0000 mg | ORAL_TABLET | Freq: Two times a day (BID) | ORAL | Status: DC

## 2017-02-28 MED ORDER — THIAMINE HCL 100 MG/ML IJ SOLN: 100.0000 mg | Freq: Every day | INTRAMUSCULAR | Status: DC

## 2017-02-28 MED ORDER — HEPARIN SODIUM (PORCINE) 5000 UNIT/ML IJ SOLN
5000.0000 [IU] | Freq: Three times a day (TID) | INTRAMUSCULAR | Status: DC
  Administered 2017-03-01: 5000 [IU] via SUBCUTANEOUS
  Filled 2017-02-28: qty 1

## 2017-02-28 MED ORDER — ACETAMINOPHEN 325 MG PO TABS
650.0000 mg | ORAL_TABLET | Freq: Four times a day (QID) | ORAL | Status: DC | PRN
  Administered 2017-03-01: 650 mg via ORAL
  Filled 2017-02-28: qty 2

## 2017-02-28 MED ORDER — ATORVASTATIN CALCIUM 20 MG PO TABS
40.0000 mg | ORAL_TABLET | Freq: Every day | ORAL | Status: DC
  Administered 2017-03-01: 40 mg via ORAL
  Filled 2017-02-28: qty 2

## 2017-02-28 MED ORDER — LORAZEPAM 2 MG/ML IJ SOLN: 1.0000 mg | Freq: Four times a day (QID) | INTRAMUSCULAR | Status: DC | PRN

## 2017-02-28 MED ORDER — ALUM & MAG HYDROXIDE-SIMETH 200-200-20 MG/5ML PO SUSP
30.0000 mL | Freq: Four times a day (QID) | ORAL | Status: DC | PRN
  Administered 2017-02-28: 30 mL via ORAL
  Filled 2017-02-28: qty 30

## 2017-02-28 MED ORDER — FUROSEMIDE 40 MG PO TABS
80.0000 mg | ORAL_TABLET | Freq: Every day | ORAL | Status: DC
  Administered 2017-03-01: 80 mg via ORAL
  Filled 2017-02-28: qty 2

## 2017-02-28 MED ORDER — ASPIRIN EC 81 MG PO TBEC
81.0000 mg | DELAYED_RELEASE_TABLET | Freq: Every day | ORAL | Status: DC
  Administered 2017-03-01: 81 mg via ORAL
  Filled 2017-02-28: qty 1

## 2017-02-28 MED ORDER — LORAZEPAM 2 MG PO TABS
0.0000 mg | ORAL_TABLET | Freq: Four times a day (QID) | ORAL | Status: DC
  Administered 2017-03-01 – 2017-03-02 (×3): 2 mg via ORAL
  Filled 2017-02-28 (×3): qty 1

## 2017-02-28 MED ORDER — ADULT MULTIVITAMIN W/MINERALS CH
1.0000 | ORAL_TABLET | Freq: Every day | ORAL | Status: DC
  Administered 2017-03-01: 1 via ORAL
  Filled 2017-02-28: qty 1

## 2017-02-28 MED ORDER — IPRATROPIUM-ALBUTEROL 0.5-2.5 (3) MG/3ML IN SOLN: 3.0000 mL | Freq: Four times a day (QID) | RESPIRATORY_TRACT | Status: DC | PRN

## 2017-02-28 MED ORDER — VITAMIN B-1 100 MG PO TABS
100.0000 mg | ORAL_TABLET | Freq: Every day | ORAL | Status: DC
  Administered 2017-03-01: 100 mg via ORAL
  Filled 2017-02-28: qty 1

## 2017-02-28 MED ORDER — NICOTINE 21 MG/24HR TD PT24: 21.0000 mg | MEDICATED_PATCH | Freq: Every day | TRANSDERMAL | Status: DC

## 2017-02-28 MED ORDER — FOLIC ACID 1 MG PO TABS
1.0000 mg | ORAL_TABLET | Freq: Every day | ORAL | Status: DC
  Administered 2017-03-01: 1 mg via ORAL
  Filled 2017-02-28: qty 1

## 2017-02-28 MED ORDER — ACETAMINOPHEN 650 MG RE SUPP: 650.0000 mg | Freq: Four times a day (QID) | RECTAL | Status: DC | PRN

## 2017-02-28 MED ORDER — OXYCODONE HCL 5 MG PO TABS
5.0000 mg | ORAL_TABLET | Freq: Four times a day (QID) | ORAL | Status: DC | PRN
  Administered 2017-02-28 – 2017-03-01 (×2): 5 mg via ORAL
  Filled 2017-02-28 (×2): qty 1

## 2017-02-28 MED ORDER — GABAPENTIN 300 MG PO CAPS
300.0000 mg | ORAL_CAPSULE | Freq: Every day | ORAL | Status: DC
Start: 2017-03-01 — End: 2017-03-02
  Administered 2017-03-01: 300 mg via ORAL
  Filled 2017-02-28: qty 1

## 2017-02-28 MED ORDER — ONDANSETRON HCL 4 MG/2ML IJ SOLN: 4.0000 mg | Freq: Four times a day (QID) | INTRAMUSCULAR | Status: DC | PRN

## 2017-02-28 MED ORDER — LACTULOSE 10 GM/15ML PO SOLN
20.0000 g | Freq: Three times a day (TID) | ORAL | Status: DC
  Administered 2017-02-28 – 2017-03-01 (×3): 20 g via ORAL
  Filled 2017-02-28 (×3): qty 30

## 2017-02-28 MED ORDER — LORAZEPAM 1 MG PO TABS: 1.0000 mg | ORAL_TABLET | Freq: Four times a day (QID) | ORAL | Status: DC | PRN

## 2017-02-28 MED ORDER — MOMETASONE FURO-FORMOTEROL FUM 200-5 MCG/ACT IN AERO
2.0000 | INHALATION_SPRAY | Freq: Two times a day (BID) | RESPIRATORY_TRACT | Status: DC
  Administered 2017-03-01 (×2): 2 via RESPIRATORY_TRACT
  Filled 2017-02-28: qty 8.8

## 2017-02-28 MED ORDER — TIOTROPIUM BROMIDE MONOHYDRATE 18 MCG IN CAPS
18.0000 ug | ORAL_CAPSULE | Freq: Every day | RESPIRATORY_TRACT | Status: DC
  Filled 2017-02-28: qty 5

## 2017-02-28 MED ORDER — PANTOPRAZOLE SODIUM 40 MG PO TBEC
40.0000 mg | DELAYED_RELEASE_TABLET | Freq: Every day | ORAL | Status: DC
  Filled 2017-02-28: qty 1

## 2017-02-28 MED ORDER — SEVELAMER CARBONATE 800 MG PO TABS
2400.0000 mg | ORAL_TABLET | Freq: Three times a day (TID) | ORAL | Status: DC
  Administered 2017-03-01 (×2): 2400 mg via ORAL
  Filled 2017-02-28 (×2): qty 3

## 2017-02-28 MED ORDER — LABETALOL HCL 100 MG PO TABS
100.0000 mg | ORAL_TABLET | Freq: Every day | ORAL | Status: DC
  Filled 2017-02-28 (×2): qty 1

## 2017-02-28 MED ORDER — ONDANSETRON HCL 4 MG PO TABS: 4.0000 mg | ORAL_TABLET | Freq: Four times a day (QID) | ORAL | Status: DC | PRN

## 2017-02-28 NOTE — ED Triage Notes (Signed)
Pt to ED reporting general malaise after having missed 2 dialysis treatments. Pts last treatment was last Thursday and a reported paresthesias on Friday. Pt reports the center told him he could not come back until her was evaluated by a doctor. Abd is distended and legs have edema noted at this time.

## 2017-02-28 NOTE — ED Provider Notes (Signed)
[facility] Emergency Department Provider Note  Time seen: 4:54 PM  I have reviewed the triage vital signs and the nursing notes.   HISTORY  Chief Complaint No chief complaint on file.    HPI Stanley [name] is a 56 y.o. male with a past medical history of alcohol abuse, CHF, cirrhosis, diabetes, past history of drug abuse, end-stage renal disease on dialysis Tuesday, Thursday, Saturday, gastric reflux, hypertension, hyperlipidemia, presents to the emergency department for generalized fatigue weakness and somnolence.  According to the patient he last had dialysis this past Thursday, 02/23/17 (verified on record review).  Patient states he was supposed to go to dialysis today but did not do so because he was feeling too weak and tired.  The patient denies any chest pain or shortness of breath.  Does state abdominal distention but states this is normal from the fluid he occasionally will get paracentesis performed for fluid drainage.  Denies any fever, cough, congestion.  Denies any vomiting or diarrhea.  States he continues to make a little bit of urine each day.  Past Medical History:  Diagnosis Date  . Alcohol abuse   . CHF (congestive heart failure) ([place])   . Cirrhosis ([place])   . Coronary artery disease 2009  . Diabetic peripheral neuropathy ([place])   . Drug abuse ([place])   . End stage renal disease on dialysis [facility]) NEPHROLOGIST-   DR [facility]  IN [place]   HEMODIALYSIS --   TUES/  THURS/  SAT  . GERD (gastroesophageal reflux disease)   . Hyperlipidemia   . Hypertension   . PAD (peripheral artery disease) ([place])   . Renal insufficiency    Per pt, 32 oz fluid restriction per day  . S/P triple vessel bypass 06/09/2016   2009ish  . Suicidal ideation    & HOMICIDAL IDEATION --  06-16-2013   ADMITTED TO BEHAVIOR HEALTH    Patient Active Problem List   Diagnosis Date Noted  . Hypocalcemia 02/22/2017  . Shortness of breath 11/26/2016  . COPD (chronic  obstructive pulmonary disease) ([place]) 10/30/2016  . COPD exacerbation ([place]) 10/29/2016  . Anemia   . Heme positive stool   . Ulceration of intestine   . Benign neoplasm of transverse colon   . Acute gastrointestinal hemorrhage   . Esophageal candidiasis ([place])   . Angiodysplasia of intestinal tract   . Acute respiratory failure with hypoxia ([place]) 07/03/2016  . GI bleeding 06/24/2016  . Rectal bleeding 06/14/2016  . Anemia of chronic disease 06/01/2016  . MRSA carrier 06/01/2016  . Chronic renal failure 05/23/2016  . Ischemic heart disease 05/23/2016  . Angiodysplasia of small intestine   . Melena   . Small bowel bleed not requiring more than 4 units of blood in 24 hours, ICU, or surgery   . Anemia due to chronic blood loss   . Abdominal pain 05/05/2016  . Acute posthemorrhagic anemia 04/17/2016  . Gastrointestinal bleed 04/17/2016  . History of esophagogastroduodenoscopy (EGD) 04/17/2016  . Elevated troponin 04/17/2016  . Alcohol abuse 04/17/2016  . Upper GI bleed 01/19/2016  . Blood in stool   . Angiodysplasia of stomach and duodenum with hemorrhage   . Gastritis   . Esophagitis, unspecified   . GI bleed 05/16/2015  . Acute GI bleeding   . Symptomatic anemia 04/30/2015  . HTN (hypertension) 04/06/2015  . GERD (gastroesophageal reflux disease) 04/06/2015  . HLD (hyperlipidemia) 04/06/2015  . Dyspnea 04/06/2015  . Cirrhosis of liver with ascites ([place]) 04/06/2015  .  Ascites 04/06/2015  . GIB (gastrointestinal bleeding) 03/23/2015  . Homicidal ideation 06/19/2013  . Suicidal intent 06/19/2013  . Homicidal ideations 06/19/2013  . Hyperkalemia 06/16/2013  . Mandible fracture ([place]) 06/05/2013  . Fracture, mandible ([place]) 06/02/2013  . Coronary atherosclerosis of native coronary artery 06/02/2013  . ESRD on dialysis ([place]) 06/02/2013  . Mandible open fracture ([place]) 06/02/2013    Past Surgical History:  Procedure Laterality Date  . AGILE CAPSULE N/A 06/19/2016   Procedure:  AGILE CAPSULE;  Surgeon: [name], MD;  [facility] ENDOSCOPY;  Service: Endoscopy;  Laterality: N/A;  . COLONOSCOPY WITH PROPOFOL N/A 06/18/2016   Procedure: COLONOSCOPY WITH PROPOFOL;  Surgeon: [name], MD;  Location: [facility] ENDOSCOPY;  Service: Endoscopy;  Laterality: N/A;  . COLONOSCOPY WITH PROPOFOL N/A 08/12/2016   Procedure: COLONOSCOPY WITH PROPOFOL;  Surgeon: [name], MD;  Location: [facility] ENDOSCOPY;  Service: Endoscopy;  Laterality: N/A;  . CORONARY ANGIOPLASTY  ?   PT UNABLE TO TELL IF  BEFORE OR AFTER  CABG  . CORONARY ARTERY BYPASS GRAFT  2008  ([place] , [place])   3 VESSEL  . DIALYSIS FISTULA CREATION  LAST SURGERY  APPOX  2008  . ENTEROSCOPY N/A 05/10/2016   Procedure: ENTEROSCOPY;  Surgeon: Jerene Bears, MD;  Location: [place];  Service: Gastroenterology;  Laterality: N/A;  . ENTEROSCOPY N/A 08/12/2016   Procedure: ENTEROSCOPY;  Surgeon: [name], MD;  Location: [facility] ENDOSCOPY;  Service: Endoscopy;  Laterality: N/A;  . ESOPHAGOGASTRODUODENOSCOPY N/A 05/07/2015   Procedure: ESOPHAGOGASTRODUODENOSCOPY (EGD);  Surgeon: Hulen Luster, MD;  Location: [facility] ENDOSCOPY;  Service: Endoscopy;  Laterality: N/A;  . ESOPHAGOGASTRODUODENOSCOPY (EGD) WITH PROPOFOL N/A 05/17/2015   Procedure: ESOPHAGOGASTRODUODENOSCOPY (EGD) WITH PROPOFOL;  Surgeon: [name], MD;  Location: [facility] ENDOSCOPY;  Service: Endoscopy;  Laterality: N/A;  . ESOPHAGOGASTRODUODENOSCOPY (EGD) WITH PROPOFOL N/A 01/20/2016   Procedure: ESOPHAGOGASTRODUODENOSCOPY (EGD) WITH PROPOFOL;  Surgeon: [name], MD;  Location: [facility] ENDOSCOPY;  Service: Endoscopy;  Laterality: N/A;  . ESOPHAGOGASTRODUODENOSCOPY (EGD) WITH PROPOFOL N/A 04/17/2016   Procedure: ESOPHAGOGASTRODUODENOSCOPY (EGD) WITH PROPOFOL;  Surgeon: Lin Landsman, MD;  Location: [facility] ENDOSCOPY;  Service: Gastroenterology;  Laterality: N/A;  . ESOPHAGOGASTRODUODENOSCOPY (EGD) WITH PROPOFOL  05/09/2016   Procedure: ESOPHAGOGASTRODUODENOSCOPY (EGD) WITH PROPOFOL;   Surgeon: Jerene Bears, MD;  Location: [place];  Service: Endoscopy;;  . ESOPHAGOGASTRODUODENOSCOPY (EGD) WITH PROPOFOL N/A 06/16/2016   Procedure: ESOPHAGOGASTRODUODENOSCOPY (EGD) WITH PROPOFOL;  Surgeon: [name], MD;  Location: [facility] ENDOSCOPY;  Service: Endoscopy;  Laterality: N/A;  . GIVENS CAPSULE STUDY N/A 05/07/2016   Procedure: GIVENS CAPSULE STUDY;  Surgeon: Doran Stabler, MD;  Location: [place];  Service: Endoscopy;  Laterality: N/A;  . MANDIBULAR HARDWARE REMOVAL N/A 07/29/2013   Procedure: REMOVAL OF ARCH BARS;  Surgeon: Theodoro Kos, DO;  Location: [place];  Service: Plastics;  Laterality: N/A;  . ORIF MANDIBULAR FRACTURE N/A 06/05/2013   Procedure: REPAIR OF MANDIBULAR FRACTURE x 2 with maxillo-mandibular fixation ;  Surgeon: Theodoro Kos, DO;  Location: [place];  Service: Plastics;  Laterality: N/A;  . PERIPHERAL ARTERIAL STENT GRAFT Left     Prior to Admission medications   Medication Sig Start Date End Date Taking? Authorizing Provider  albuterol (PROVENTIL HFA;VENTOLIN HFA) 108 (90 Base) MCG/ACT inhaler Inhale 2 puffs into the lungs every 6 (six) hours as needed for wheezing or shortness of breath. 02/19/17   Darel Hong, MD  alum & mag hydroxide-simeth (MAALOX/MYLANTA) 200-200-20 MG/5ML suspension Take 15 mLs every 4 (four) hours as  needed by mouth for indigestion or heartburn. 01/25/17   Demetrios Loll, MD  aspirin 81 MG EC tablet Take 81 mg by mouth daily. 11/17/16   [provider]  atorvastatin (LIPITOR) 40 MG tablet Take 1 tablet (40 mg total) by mouth daily. 11/16/16   Vaughan Basta, MD  budesonide-formoterol (SYMBICORT) 160-4.5 MCG/ACT inhaler Inhale 2 puffs into the lungs daily. 11/16/16   Vaughan Basta, MD  ferrous sulfate 325 (65 FE) MG tablet Take 1 tablet by mouth 3 (three) times daily. 01/21/17   [provider]  furosemide (LASIX) 80 MG tablet Take 1 tablet (80 mg total) daily by mouth. 01/25/17   Demetrios Loll, MD  gabapentin (NEURONTIN) 300 MG capsule TAKE 1 CAPSULE BY MOUTH EVERY EVENING AND 1 EXTRA CAP ON DIALYSIS DAYS AS NEEDED FOR NERVE PAIN 08/16/16   [provider]  ipratropium-albuterol (DUONEB) 0.5-2.5 (3) MG/3ML SOLN Take 3 mLs by nebulization every 6 (six) hours as needed. 11/28/16   Loletha Grayer, MD  labetalol (NORMODYNE) 100 MG tablet Take 1 tablet (100 mg total) by mouth daily. 11/16/16   Vaughan Basta, MD  lactulose (CHRONULAC) 10 GM/15ML solution Take 30 mLs (20 g total) by mouth 3 (three) times daily. 01/11/17   Bettey Costa, MD  Multiple Vitamins-Minerals-FA (DIALYVITE SUPREME D) 3 MG TABS Take 1 tablet (3 mg total) by mouth daily. 11/28/16   Loletha Grayer, MD  nicotine (NICODERM CQ - DOSED IN MG/24 HOURS) 21 mg/24hr patch Place 1 patch (21 mg total) onto the skin daily. 01/12/17   Bettey Costa, MD  nitroGLYCERIN (NITROSTAT) 0.4 MG SL tablet Place 1 tablet (0.4 mg total) under the tongue every 5 (five) minutes as needed. 04/12/16   Wende Bushy, MD  pantoprazole (PROTONIX) 40 MG tablet Take 1 tablet (40 mg total) by mouth daily. 11/28/16   Loletha Grayer, MD  sevelamer carbonate (RENVELA) 800 MG tablet Take 3 tablets (2,400 mg total) by mouth 3 (three) times daily with meals. Patient not taking: Reported on 01/22/2017 11/16/16   Vaughan Basta, MD  Spacer/Aero Chamber Mouthpiece MISC 1 Units by Does not apply route every 4 (four) hours as needed (wheezing). 02/19/17   Darel Hong, MD  tiotropium (SPIRIVA HANDIHALER) 18 MCG inhalation capsule Place 1 capsule (18 mcg total) into inhaler and inhale daily. 11/16/16 11/16/17  Vaughan Basta, MD    No Known Allergies  Family History  Problem Relation Age of Onset  . Colon cancer Mother   . Cancer Father   . Cancer Sister   . Kidney disease Brother     Social History Social History   Tobacco Use  . Smoking status: Current Every Day Smoker    Packs/day: 0.15    Years: 40.00    Pack years: 6.00     Types: Cigarettes  . Smokeless tobacco: Never Used  Substance Use Topics  . Alcohol use: No    Comment: pt reports quitting after learning about cirrhosis  . Drug use: No    Review of Systems Constitutional: Negative for fever Cardiovascular: Negative for chest pain. Respiratory: Negative for shortness of breath. Gastrointestinal: Negative for abdominal pain, vomiting, or diarrhea. Genitourinary: Negative for dysuria, makes a small amount of urine each day per patient. Neurological: Negative for headache.  Denies focal weakness or numbness, but states generalized fatigue/weakness. All other ROS negative  ____________________________________________   PHYSICAL EXAM:  Constitutional: Alert and oriented. Well appearing and in no distress. Eyes: Normal exam ENT   Head: Normocephalic and atraumatic.   Mouth/Throat:  Mucous membranes are moist. Cardiovascular: Normal rate, regular rhythm. No murmur Respiratory: Normal respiratory effort without tachypnea nor retractions. Breath sounds are clear Gastrointestinal: Soft, but moderately distended but dull percussion consistent with ascites, nontender. Musculoskeletal: Nontender with normal range of motion in all extremities.  Mild lower extremity edema fairly equal bilaterally.  Largely nontender. Neurologic:  Normal speech and language. No gross focal neurologic deficits Skin:  Skin is warm, dry and intact.  Psychiatric: Mood and affect are normal.   ____________________________________________    EKG  EKG reviewed and interpreted by myself shows normal sinus rhythm 83 bpm, slightly widened QRS, normal axis, slight QTC prolongation otherwise largely normal intervals with nonspecific ST changes but no ST elevation.  ____________________________________________    RADIOLOGY  IMPRESSION: Cardiomegaly and vascular congestion similar to 6 days prior.  ____________________________________________   INITIAL IMPRESSION /  ASSESSMENT AND PLAN / ED COURSE  Pertinent labs & imaging results that were available during my care of the patient were reviewed by me and considered in my medical decision making (see chart for details).  Patient presents to the emergency department for generalized fatigue weakness, has not been to dialysis in approximately 5 days.  Differential would include elevated uric acid level, hyperkalemia, hypocalcemia, other left leg abnormality or metabolic abnormality, dehydration, infectious etiology.  We will check labs and continue to closely monitor the patient in the emergency department.  I reviewed the patient's records including his discharge summary 02/24/17 in which the patient signed out [place].  Pertinently the patient was evaluated by physical therapy during this admission for generalized weakness and they stated he had no advance needs required.  Patient's labs have resulted with a BUN of 69, potassium is 6.0 and a calcium of 7.8.  I discussed the patient with Dr. Juleen China, who recommends admission for dialysis tomorrow.  Patient agreeable to this plan of care and wishes to stay.  I discussed with the patient the need to get back on his dialysis schedule and adhere to it.  Patient agreeable. ____________________________________________   FINAL CLINICAL IMPRESSION(S) / ED DIAGNOSES  Weakness Somnolence Hyperkalemia Hypocalcemia Uremia   Harvest Dark, MD 02/28/17 Karl Bales

## 2017-02-28 NOTE — ED Notes (Signed)
MD at bedside at this time.

## 2017-02-28 NOTE — H&P (Signed)
[name] NAME: Stanley [name]    MR#:  [id number]  DATE OF BIRTH:  [birth year]  DATE OF ADMISSION:  02/28/2017  PRIMARY CARE PHYSICIAN: Alene Mires Elyse Jarvis, MD   REQUESTING/REFERRING PHYSICIAN: Kerman Passey, MD  CHIEF COMPLAINT:   Chief Complaint  Patient presents with  . Mailase    HISTORY OF PRESENT ILLNESS:  Stanley [name]  is a 56 y.o. male who presents with malaise and lethargy.  He presents with electrolyte derangements and uremia.  Patient has missed last 2 dialysis sessions.  Hospitalist were called for admission with nephrology on board for dialysis.  PAST MEDICAL HISTORY:   Past Medical History:  Diagnosis Date  . Alcohol abuse   . CHF (congestive heart failure) ([place])   . Cirrhosis ([place])   . Coronary artery disease 2009  . Diabetic peripheral neuropathy ([place])   . Drug abuse ([place])   . End stage renal disease on dialysis [facility]) NEPHROLOGIST-   DR [facility]  IN [place]   HEMODIALYSIS --   TUES/  THURS/  SAT  . GERD (gastroesophageal reflux disease)   . Hyperlipidemia   . Hypertension   . PAD (peripheral artery disease) ([place])   . Renal insufficiency    Per pt, 32 oz fluid restriction per day  . S/P triple vessel bypass 06/09/2016   2009ish  . Suicidal ideation    & HOMICIDAL IDEATION --  06-16-2013   ADMITTED TO BEHAVIOR HEALTH    PAST SURGICAL HISTORY:   Past Surgical History:  Procedure Laterality Date  . AGILE CAPSULE N/A 06/19/2016   Procedure: AGILE CAPSULE;  Surgeon: [name], MD;  Location: [facility] ENDOSCOPY;  Service: Endoscopy;  Laterality: N/A;  . COLONOSCOPY WITH PROPOFOL N/A 06/18/2016   Procedure: COLONOSCOPY WITH PROPOFOL;  Surgeon: [name], MD;  Location: [facility] ENDOSCOPY;  Service: Endoscopy;  Laterality: N/A;  . COLONOSCOPY WITH PROPOFOL N/A 08/12/2016   Procedure: COLONOSCOPY WITH PROPOFOL;  Surgeon: [name], MD;  Location: [facility] ENDOSCOPY;  Service: Endoscopy;  Laterality: N/A;   . CORONARY ANGIOPLASTY  ?   PT UNABLE TO TELL IF  BEFORE OR AFTER  CABG  . CORONARY ARTERY BYPASS GRAFT  2008  ([place] , [place])   3 VESSEL  . DIALYSIS FISTULA CREATION  LAST SURGERY  APPOX  2008  . ENTEROSCOPY N/A 05/10/2016   Procedure: ENTEROSCOPY;  Surgeon: Jerene Bears, MD;  Location: [place];  Service: Gastroenterology;  Laterality: N/A;  . ENTEROSCOPY N/A 08/12/2016   Procedure: ENTEROSCOPY;  Surgeon: [name], MD;  Location: [facility] ENDOSCOPY;  Service: Endoscopy;  Laterality: N/A;  . ESOPHAGOGASTRODUODENOSCOPY N/A 05/07/2015   Procedure: ESOPHAGOGASTRODUODENOSCOPY (EGD);  Surgeon: Hulen Luster, MD;  Location[facility] ENDOSCOPY;  Service: Endoscopy;  Laterality: N/A;  . ESOPHAGOGASTRODUODENOSCOPY (EGD) WITH PROPOFOL N/A 05/17/2015   Procedure: ESOPHAGOGASTRODUODENOSCOPY (EGD) WITH PROPOFOL;  Surgeon: [name], MD;  Location: [facility] ENDOSCOPY;  Service: Endoscopy;  Laterality: N/A;  . ESOPHAGOGASTRODUODENOSCOPY (EGD) WITH PROPOFOL N/A 01/20/2016   Procedure: ESOPHAGOGASTRODUODENOSCOPY (EGD) WITH PROPOFOL;  Surgeon: [name], MD;  Location: [facility] ENDOSCOPY;  Service: Endoscopy;  Laterality: N/A;  . ESOPHAGOGASTRODUODENOSCOPY (EGD) WITH PROPOFOL N/A 04/17/2016   Procedure: ESOPHAGOGASTRODUODENOSCOPY (EGD) WITH PROPOFOL;  Surgeon: Lin Landsman, MD;  Location: [facility] ENDOSCOPY;  Service: Gastroenterology;  Laterality: N/A;  . ESOPHAGOGASTRODUODENOSCOPY (EGD) WITH PROPOFOL  05/09/2016   Procedure: ESOPHAGOGASTRODUODENOSCOPY (EGD) WITH PROPOFOL;  Surgeon: Jerene Bears, MD;  Location: [place] ENDOSCOPY;  Service: Endoscopy;;  . ESOPHAGOGASTRODUODENOSCOPY (EGD) WITH PROPOFOL  N/A 06/16/2016   Procedure: ESOPHAGOGASTRODUODENOSCOPY (EGD) WITH PROPOFOL;  Surgeon: [name], MD;  Location: [facility] ENDOSCOPY;  Service: Endoscopy;  Laterality: N/A;  . GIVENS CAPSULE STUDY N/A 05/07/2016   Procedure: GIVENS CAPSULE STUDY;  Surgeon: Doran Stabler, MD;  Location: [place];  Service: Endoscopy;  Laterality:  N/A;  . MANDIBULAR HARDWARE REMOVAL N/A 07/29/2013   Procedure: REMOVAL OF ARCH BARS;  Surgeon: Theodoro Kos, DO;  Location: [place];  Service: Plastics;  Laterality: N/A;  . ORIF MANDIBULAR FRACTURE N/A 06/05/2013   Procedure: REPAIR OF MANDIBULAR FRACTURE x 2 with maxillo-mandibular fixation ;  Surgeon: Theodoro Kos, DO;  Location: [place];  Service: Plastics;  Laterality: N/A;  . PERIPHERAL ARTERIAL STENT GRAFT Left     SOCIAL HISTORY:   Social History   Tobacco Use  . Smoking status: Current Every Day Smoker    Packs/day: 0.15    Years: 40.00    Pack years: 6.00    Types: Cigarettes  . Smokeless tobacco: Never Used  Substance Use Topics  . Alcohol use: No    Comment: pt reports quitting after learning about cirrhosis    FAMILY HISTORY:   Family History  Problem Relation Age of Onset  . Colon cancer Mother   . Cancer Father   . Cancer Sister   . Kidney disease Brother     DRUG ALLERGIES:  No Known Allergies  MEDICATIONS AT HOME:   Prior to Admission medications   Medication Sig Start Date End Date Taking? Authorizing Provider  albuterol (PROVENTIL HFA;VENTOLIN HFA) 108 (90 Base) MCG/ACT inhaler Inhale 2 puffs into the lungs every 6 (six) hours as needed for wheezing or shortness of breath. 02/19/17   Darel Hong, MD  alum & mag hydroxide-simeth (MAALOX/MYLANTA) 200-200-20 MG/5ML suspension Take 15 mLs every 4 (four) hours as needed by mouth for indigestion or heartburn. 01/25/17   Demetrios Loll, MD  aspirin 81 MG EC tablet Take 81 mg by mouth daily. 11/17/16   [provider]  atorvastatin (LIPITOR) 40 MG tablet Take 1 tablet (40 mg total) by mouth daily. 11/16/16   Vaughan Basta, MD  budesonide-formoterol (SYMBICORT) 160-4.5 MCG/ACT inhaler Inhale 2 puffs into the lungs daily. 11/16/16   Vaughan Basta, MD  ferrous sulfate 325 (65 FE) MG tablet Take 1 tablet by mouth 3 (three) times daily. 01/21/17   [provider]   furosemide (LASIX) 80 MG tablet Take 1 tablet (80 mg total) daily by mouth. 01/25/17   Demetrios Loll, MD  gabapentin (NEURONTIN) 300 MG capsule TAKE 1 CAPSULE BY MOUTH EVERY EVENING AND 1 EXTRA CAP ON DIALYSIS DAYS AS NEEDED FOR NERVE PAIN 08/16/16   [provider]  ipratropium-albuterol (DUONEB) 0.5-2.5 (3) MG/3ML SOLN Take 3 mLs by nebulization every 6 (six) hours as needed. 11/28/16   Loletha Grayer, MD  labetalol (NORMODYNE) 100 MG tablet Take 1 tablet (100 mg total) by mouth daily. 11/16/16   Vaughan Basta, MD  lactulose (CHRONULAC) 10 GM/15ML solution Take 30 mLs (20 g total) by mouth 3 (three) times daily. 01/11/17   Bettey Costa, MD  Multiple Vitamins-Minerals-FA (DIALYVITE SUPREME D) 3 MG TABS Take 1 tablet (3 mg total) by mouth daily. 11/28/16   Loletha Grayer, MD  nicotine (NICODERM CQ - DOSED IN MG/24 HOURS) 21 mg/24hr patch Place 1 patch (21 mg total) onto the skin daily. 01/12/17   Bettey Costa, MD  nitroGLYCERIN (NITROSTAT) 0.4 MG SL tablet Place 1 tablet (0.4 mg total) under the tongue every  5 (five) minutes as needed. 04/12/16   Wende Bushy, MD  pantoprazole (PROTONIX) 40 MG tablet Take 1 tablet (40 mg total) by mouth daily. 11/28/16   Loletha Grayer, MD  sevelamer carbonate (RENVELA) 800 MG tablet Take 3 tablets (2,400 mg total) by mouth 3 (three) times daily with meals. Patient not taking: Reported on 01/22/2017 11/16/16   Vaughan Basta, MD  Spacer/Aero Chamber Mouthpiece MISC 1 Units by Does not apply route every 4 (four) hours as needed (wheezing). 02/19/17   Darel Hong, MD  tiotropium (SPIRIVA HANDIHALER) 18 MCG inhalation capsule Place 1 capsule (18 mcg total) into inhaler and inhale daily. 11/16/16 11/16/17  Vaughan Basta, MD    REVIEW OF SYSTEMS:  Review of Systems  Constitutional: Positive for malaise/fatigue. Negative for chills, fever and weight loss.  HENT: Negative for ear pain, hearing loss and tinnitus.   Eyes: Negative for blurred  vision, double vision, pain and redness.  Respiratory: Negative for cough, hemoptysis and shortness of breath.   Cardiovascular: Positive for leg swelling. Negative for chest pain, palpitations and orthopnea.  Gastrointestinal: Negative for abdominal pain, constipation, diarrhea, nausea and vomiting.  Genitourinary: Negative for dysuria, frequency and hematuria.  Musculoskeletal: Negative for back pain, joint pain and neck pain.  Skin:       No acne, rash, or lesions  Neurological: Positive for weakness. Negative for dizziness, tremors and focal weakness.  Endo/Heme/Allergies: Negative for polydipsia. Does not bruise/bleed easily.  Psychiatric/Behavioral: Negative for depression. The patient is not nervous/anxious and does not have insomnia.      VITAL SIGNS:   Vitals:   02/28/17 1800 02/28/17 1830 02/28/17 1947 02/28/17 2000  BP: 123/69 131/78 120/74 136/76  Pulse:   85 86  Resp: 17 16 20 16   Temp:      TempSrc:      SpO2:   97% 99%  Weight:      Height:       Wt Readings from Last 3 Encounters:  02/28/17 74.8 kg (165 lb)  02/24/17 74.9 kg (165 lb 1.6 oz)  01/25/17 71.1 kg (156 lb 12.8 oz)    PHYSICAL EXAMINATION:  Physical Exam  Vitals reviewed. Constitutional: He is oriented to person, place, and time. He appears well-developed and well-nourished. No distress.  HENT:  Head: Normocephalic and atraumatic.  Mouth/Throat: Oropharynx is clear and moist.  Eyes: Conjunctivae and EOM are normal. Pupils are equal, round, and reactive to light. No scleral icterus.  Neck: Normal range of motion. Neck supple. No JVD present. No thyromegaly present.  Cardiovascular: Normal rate, regular rhythm and intact distal pulses. Exam reveals no gallop and no friction rub.  No murmur heard. Respiratory: Effort normal and breath sounds normal. No respiratory distress. He has no wheezes. He has no rales.  GI: Soft. Bowel sounds are normal. He exhibits no distension. There is no tenderness.   Musculoskeletal: Normal range of motion. He exhibits edema.  No arthritis, no gout  Lymphadenopathy:    He has no cervical adenopathy.  Neurological: He is alert and oriented to person, place, and time. No cranial nerve deficit.  No dysarthria, no aphasia  Skin: Skin is warm and dry. No rash noted. No erythema.  Psychiatric: He has a normal mood and affect. His behavior is normal. Judgment and thought content normal.    LABORATORY PANEL:   CBC Recent Labs  Lab 02/28/17 1731  WBC 5.9  HGB 9.0*  HCT 28.9*  PLT 259   ------------------------------------------------------------------------------------------------------------------  Chemistries  Recent Labs  Lab 02/24/17 0026  02/28/17 1731  NA 137   < > 139  K 4.4   < > 6.0*  CL 99*   < > 96*  CO2 32   < > 29  GLUCOSE 109*   < > 114*  BUN 24*   < > 69*  CREATININE 5.30*   < > 10.30*  CALCIUM 7.6*   < > 7.8*  MG 2.9*  --   --   AST 26   < > 37  ALT 24   < > 25  ALKPHOS 163*   < > 169*  BILITOT 0.5   < > 0.3   < > = values in this interval not displayed.   ------------------------------------------------------------------------------------------------------------------  Cardiac Enzymes Recent Labs  Lab 02/28/17 1731  TROPONINI 0.03*   ------------------------------------------------------------------------------------------------------------------  RADIOLOGY:  Dg Chest Portable 1 View  Result Date: 02/28/2017 CLINICAL DATA:  Generalized malaise.  Missed dialysis EXAM: PORTABLE CHEST 1 VIEW COMPARISON:  Six days ago FINDINGS: Chronic cardiopericardial enlargement. Status post CABG. Chronic vascular congestion and interstitial coarsening. No pneumonia, effusion, or definite Kerley lines. IMPRESSION: Cardiomegaly and vascular congestion similar to 6 days prior. Electronically Signed   By: Monte Fantasia M.D.   On: 02/28/2017 19:05    EKG:   Orders placed or performed during the hospital encounter of 02/28/17   . EKG 12-Lead  . EKG 12-Lead    IMPRESSION AND PLAN:  Principal Problem:   Weakness -likely due to his uremia and hyperkalemia, treat with dialysis as below Active Problems:   ESRD on dialysis [facility]) -nephrology consult for dialysis support   Hyperkalemia -somewhat elevated, no cardiac changes on EKG, recheck later on tonight to see if he requires any acute treatment, otherwise will be treated with dialysis   Coronary atherosclerosis of native coronary artery -continue home meds   HTN (hypertension) -continue home medications   Cirrhosis of liver with ascites ([place]) -avoid hepatotoxins   Alcohol abuse -CIWA protocol   COPD (chronic obstructive pulmonary disease) ([place]) -home dose inhalers   GERD (gastroesophageal reflux disease) -home dose PPI  All the records are reviewed and case discussed with ED provider. Management plans discussed with the patient and/or family.  DVT PROPHYLAXIS: SubQ heparin  GI PROPHYLAXIS: PPI  ADMISSION STATUS: Inpatient  CODE STATUS: Full Code Status History    Date Active Date Inactive Code Status Order ID Comments User Context   02/22/2017 17:21 02/24/2017 13:59 Full Code [id number]  Dustin Flock, MD Inpatient   01/22/2017 23:37 01/25/2017 15:22 Full Code [id number]  Gorden Harms, MD Inpatient   01/07/2017 14:45 01/13/2017 18:12 Full Code [id number]  Idelle Crouch, MD Inpatient   12/22/2016 14:31 12/24/2016 18:04 Full Code [id number]  Hillary Bow, MD ED   12/20/2016 20:20 12/21/2016 18:53 Full Code [id number]  Demetrios Loll, MD Inpatient   11/26/2016 10:30 11/28/2016 15:22 Full Code [id number]  Henreitta Leber, MD Inpatient   11/15/2016 19:30 11/16/2016 18:22 Full Code [id number]  Fritzi Mandes, MD Inpatient   10/29/2016 14:53 10/30/2016 15:06 Full Code [id number]  Loletha Grayer, MD ED   09/15/2016 17:57 09/16/2016 18:17 Full Code [id number]  Dustin Flock, MD ED   08/16/2016 09:45 08/18/2016 18:16 Full Code [id number]  Hillary Bow, MD ED   08/09/2016 16:59  08/12/2016 20:34 Full Code [id number]  Fritzi Mandes, MD Inpatient   07/03/2016 21:54 07/05/2016 21:22 Full Code [id number]  Demetrios Loll, MD Inpatient   06/24/2016 04:23 06/26/2016 15:46 Full Code [id number]  Saundra Shelling, MD Inpatient  06/14/2016 14:53 06/19/2016 21:04 Full Code [id number]  Hillary Bow, MD ED   05/31/2016 06:42 06/01/2016 21:30 Full Code [id number]  Saundra Shelling, MD Inpatient   05/05/2016 23:09 05/10/2016 21:57 Full Code [id number]  Vianne Bulls, MD Inpatient   04/16/2016 03:56 04/17/2016 18:27 Full Code [id number]  Saundra Shelling, MD Inpatient   01/19/2016 15:13 01/22/2016 14:14 Full Code [id number]  Loletha Grayer, MD ED   09/24/2015 10:33 09/25/2015 13:31 Full Code [id number]  Loletha Grayer, MD ED   05/16/2015 14:17 05/17/2015 20:57 Full Code [id number]  Idelle Crouch, MD Inpatient   04/30/2015 20:08 05/08/2015 19:40 Full Code [id number]  Vaughan Basta, MD Inpatient   04/06/2015 05:07 04/07/2015 17:36 Full Code [id number]  Lance Coon, MD Inpatient   03/23/2015 11:07 03/27/2015 12:46 Full Code [id number]  Bettey Costa, MD Inpatient   06/19/2013 11:32 06/21/2013 17:49 Full Code [id number]  Cristal Ford, DO Inpatient   06/16/2013 17:26 06/19/2013 11:32 Full Code [id number]  Margarita Mail, PA-C ED   06/05/2013 15:33 06/06/2013 18:56 Full Code [id number]  Theodoro Kos, DO Inpatient   06/02/2013 09:01 06/05/2013 15:33 Full Code [id number]  Velvet Bathe, MD Inpatient   06/02/2013 08:40 06/02/2013 09:01 Full Code [id number]  Velvet Bathe, MD Inpatient      TOTAL TIME TAKING CARE OF THIS PATIENT: 45 minutes.   Kien Mirsky [place] 02/28/2017, 9:18 PM  [employer]  [phone]  CC: Primary care physician; [name], MD  Note:  This document was prepared using Dragon voice recognition software and may include unintentional dictation errors.

## 2017-03-01 ENCOUNTER — Inpatient Hospital Stay: Payer: Medicare Other

## 2017-03-01 ENCOUNTER — Other Ambulatory Visit: Payer: Self-pay

## 2017-03-01 LAB — CBC
HCT: 27 % — ABNORMAL LOW (ref 40.0–52.0)
HEMOGLOBIN: 8.7 g/dL — AB (ref 13.0–18.0)
MCH: 27.1 pg (ref 26.0–34.0)
MCHC: 32.2 g/dL (ref 32.0–36.0)
MCV: 84.1 fL (ref 80.0–100.0)
Platelets: 247 10*3/uL (ref 150–440)
RBC: 3.21 MIL/uL — AB (ref 4.40–5.90)
RDW: 19.3 % — ABNORMAL HIGH (ref 11.5–14.5)
WBC: 6.4 10*3/uL (ref 3.8–10.6)

## 2017-03-01 LAB — COMPREHENSIVE METABOLIC PANEL
ALK PHOS: 195 U/L — AB (ref 38–126)
ALT: 40 U/L (ref 17–63)
ANION GAP: 12 (ref 5–15)
AST: 61 U/L — ABNORMAL HIGH (ref 15–41)
Albumin: 2.7 g/dL — ABNORMAL LOW (ref 3.5–5.0)
BILIRUBIN TOTAL: 0.5 mg/dL (ref 0.3–1.2)
BUN: 73 mg/dL — ABNORMAL HIGH (ref 6–20)
CALCIUM: 7.8 mg/dL — AB (ref 8.9–10.3)
CO2: 29 mmol/L (ref 22–32)
Chloride: 97 mmol/L — ABNORMAL LOW (ref 101–111)
Creatinine, Ser: 10.21 mg/dL — ABNORMAL HIGH (ref 0.61–1.24)
GFR, EST AFRICAN AMERICAN: 6 mL/min — AB (ref >60)
GFR, EST NON AFRICAN AMERICAN: 5 mL/min — AB (ref >60)
Glucose, Bld: 93 mg/dL (ref 65–99)
Potassium: 6.5 mmol/L (ref 3.5–5.1)
SODIUM: 138 mmol/L (ref 135–145)
TOTAL PROTEIN: 5.9 g/dL — AB (ref 6.5–8.1)

## 2017-03-01 LAB — GLUCOSE, CAPILLARY
GLUCOSE-CAPILLARY: 70 mg/dL (ref 65–99)
Glucose-Capillary: 73 mg/dL (ref 65–99)
Glucose-Capillary: 170 mg/dL — ABNORMAL HIGH (ref 65–99)

## 2017-03-01 MED ORDER — ALBUMIN HUMAN 25 % IV SOLN
25.0000 g | Freq: Once | INTRAVENOUS | Status: AC
  Administered 2017-03-01: 25 g via INTRAVENOUS
  Filled 2017-03-01: qty 100

## 2017-03-01 MED ORDER — DIPHENHYDRAMINE HCL 50 MG/ML IJ SOLN
12.5000 mg | Freq: Once | INTRAMUSCULAR | Status: AC
  Administered 2017-03-01: 12.5 mg via INTRAVENOUS

## 2017-03-01 MED ORDER — ALBUMIN HUMAN 5 % IV SOLN
25.0000 g | Freq: Once | INTRAVENOUS | Status: DC
  Filled 2017-03-01: qty 500

## 2017-03-01 MED ORDER — HEPARIN SODIUM (PORCINE) 5000 UNIT/ML IJ SOLN
5000.0000 [IU] | Freq: Three times a day (TID) | INTRAMUSCULAR | Status: DC
  Administered 2017-03-01: 5000 [IU] via SUBCUTANEOUS
  Filled 2017-03-01: qty 1

## 2017-03-01 MED ORDER — EPOETIN ALFA 10000 UNIT/ML IJ SOLN
10000.0000 [IU] | Freq: Once | INTRAMUSCULAR | Status: AC
  Administered 2017-03-01: 10000 [IU] via INTRAVENOUS

## 2017-03-01 MED ORDER — OXYCODONE HCL 5 MG PO TABS
5.0000 mg | ORAL_TABLET | Freq: Four times a day (QID) | ORAL | Status: DC | PRN
  Administered 2017-03-01 – 2017-03-02 (×2): 5 mg via ORAL
  Filled 2017-03-01 (×2): qty 1

## 2017-03-01 NOTE — Procedures (Signed)
US guided paracentesis.  Removed 4.5 liters of amber colored fluid.  Minimal blood loss and no immediate complication.

## 2017-03-01 NOTE — Progress Notes (Signed)
Pre HD assessment  

## 2017-03-01 NOTE — Progress Notes (Signed)
Hd tx start

## 2017-03-01 NOTE — Care Management (Signed)
Amanda Morris HD liaison notified of admission.  

## 2017-03-01 NOTE — Plan of Care (Signed)
Patient is progressing.  Abdomen is quite distended.  Patient also complains of right leg pain.  Stanley [name]

## 2017-03-01 NOTE — Progress Notes (Signed)
Post HD assessment  

## 2017-03-01 NOTE — Progress Notes (Signed)
HD tx end  

## 2017-03-01 NOTE — Progress Notes (Signed)
[facility]  ROUNDING NOTE   Subjective:   [name] admitted to [facility]. Hyperkalemia  Seen and examined on hemodialysis.   Objective:  Vital signs in last 24 hours:  Temp:  [97.7 F (36.5 C)-98.2 F (36.8 C)] 98 F (36.7 C) (12/19 1050) Pulse Rate:  [68-91] 76 (12/19 1330) Resp:  [12-20] 12 (12/19 1330) BP: (108-136)/(52-81) 126/81 (12/19 1330) SpO2:  [93 %-100 %] 100 % (12/19 1330) Weight:  [74.8 kg (165 lb)-82.7 kg (182 lb 5.1 oz)] 82.7 kg (182 lb 5.1 oz) (12/19 1050)  Weight change:  Filed Weights   02/28/17 1658 03/01/17 0500 03/01/17 1050  Weight: 74.8 kg (165 lb) 80.3 kg (177 lb 1.6 oz) 82.7 kg (182 lb 5.1 oz)    Intake/Output: No intake/output data recorded.   Intake/Output this shift:  Total I/O In: 8 [P.O.:8] Out: -   Physical Exam: General: NAD,   Head: Normocephalic, atraumatic. Moist oral mucosal membranes  Eyes: Anicteric, PERRL  Neck: Supple, trachea midline  Lungs:  Clear to auscultation  Heart: Regular rate and rhythm  Abdomen:  Soft, nontender, +ascites  Extremities: no peripheral edema.  Neurologic: Nonfocal, moving all four extremities  Skin: No lesions  Access: AVF    Basic Metabolic Panel: Recent Labs  Lab 02/22/17 2039 02/23/17 0534 02/23/17 1245 02/24/17 0026 02/24/17 0621 02/28/17 1731 03/01/17 0128  NA  --  133*  --  137 137 139 138  K  --  >7.5*  --  4.4 4.5 6.0* 6.5*  CL  --  99*  --  99* 100* 96* 97*  CO2  --  24  --  32 31 29 29   GLUCOSE  --  107*  --  109* 123* 114* 93  BUN  --  63*  --  24* 26* 69* 73*  CREATININE  --  10.29*  --  5.30* 5.53* 10.30* 10.21*  CALCIUM 8.2* 7.9*  --  7.6* 7.4* 7.8* 7.8*  MG  --   --   --  2.9*  --   --   --   PHOS 4.3  --  5.1*  --   --   --   --     Liver Function Tests: Recent Labs  Lab 02/24/17 0026 02/24/17 0621 02/28/17 1731 03/01/17 0128  AST 26 26 37 61*  ALT 24 21 25  40  ALKPHOS 163* 155* 169* 195*  BILITOT 0.5 0.5 0.3 0.5  PROT 5.3* 5.2*  5.9* 5.9*  ALBUMIN 2.3* 2.2* 2.6* 2.7*   No results for input(s): LIPASE, AMYLASE in the last 168 hours. No results for input(s): AMMONIA in the last 168 hours.  CBC: Recent Labs  Lab 02/23/17 0534 02/24/17 0621 02/28/17 1731 03/01/17 0128  WBC 7.3 4.7 5.9 6.4  HGB 10.9* 10.2* 9.0* 8.7*  HCT 35.2* 32.6* 28.9* 27.0*  MCV 87.6 86.4 86.0 84.1  PLT 264 243 259 247    Cardiac Enzymes: Recent Labs  Lab 02/28/17 1731  TROPONINI 0.03*    BNP: Invalid input(s): POCBNP  CBG: Recent Labs  Lab 03/01/17 0737  GLUCAP 2    Microbiology: Results for orders placed or performed during the hospital encounter of 02/22/17  Blood culture (routine x 2)     Status: None   Collection Time: 02/22/17  2:03 PM  Result Value Ref Range Status   Specimen Description BLOOD LFOA  Final   Special Requests   Final    BOTTLES DRAWN AEROBIC AND ANAEROBIC Blood Culture adequate volume  Culture NO GROWTH 5 DAYS  Final   Report Status 02/27/2017 FINAL  Final  Blood culture (routine x 2)     Status: None   Collection Time: 02/22/17  2:03 PM  Result Value Ref Range Status   Specimen Description BLOOD LEFT HAND  Final   Special Requests   Final    BOTTLES DRAWN AEROBIC AND ANAEROBIC Blood Culture adequate volume   Culture NO GROWTH 5 DAYS  Final   Report Status 02/27/2017 FINAL  Final  MRSA PCR Screening     Status: None   Collection Time: 02/22/17  8:47 PM  Result Value Ref Range Status   MRSA by PCR NEGATIVE NEGATIVE Final    Comment:        The GeneXpert MRSA Assay (FDA approved for NASAL specimens only), is one component of a comprehensive MRSA colonization surveillance program. It is not intended to diagnose MRSA infection nor to guide or monitor treatment for MRSA infections.     Coagulation Studies: No results for input(s): LABPROT, INR in the last 72 hours.  Urinalysis: No results for input(s): COLORURINE, LABSPEC, PHURINE, GLUCOSEU, HGBUR, BILIRUBINUR, KETONESUR,  PROTEINUR, UROBILINOGEN, NITRITE, LEUKOCYTESUR in the last 72 hours.  Invalid input(s): APPERANCEUR    Imaging: Dg Chest Portable 1 View  Result Date: 02/28/2017 CLINICAL DATA:  Generalized malaise.  Missed dialysis EXAM: PORTABLE CHEST 1 VIEW COMPARISON:  Six days ago FINDINGS: Chronic cardiopericardial enlargement. Status post CABG. Chronic vascular congestion and interstitial coarsening. No pneumonia, effusion, or definite Kerley lines. IMPRESSION: Cardiomegaly and vascular congestion similar to 6 days prior. Electronically Signed   By: Monte Fantasia M.D.   On: 02/28/2017 19:05     Medications:    . aspirin EC  81 mg Oral Daily  . atorvastatin  40 mg Oral Daily  . folic acid  1 mg Oral Daily  . furosemide  80 mg Oral Daily  . gabapentin  300 mg Oral Daily  . heparin  5,000 Units Subcutaneous Q8H  . labetalol  100 mg Oral Daily  . lactulose  20 g Oral TID  . LORazepam  0-4 mg Oral Q6H   Followed by  . [START ON 03/02/2017] LORazepam  0-4 mg Oral Q12H  . mometasone-formoterol  2 puff Inhalation BID  . multivitamin with minerals  1 tablet Oral Daily  . nicotine  21 mg Transdermal Daily  . pantoprazole  40 mg Oral Daily  . sevelamer carbonate  2,400 mg Oral TID WC  . thiamine  100 mg Oral Daily   Or  . thiamine  100 mg Intravenous Daily  . tiotropium  18 mcg Inhalation Daily   acetaminophen **OR** acetaminophen, alum & mag hydroxide-simeth, ipratropium-albuterol, LORazepam **OR** LORazepam, ondansetron **OR** ondansetron (ZOFRAN) IV, oxyCODONE  Assessment/ Plan:  [name] is a 56 y.o. 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 End-stage renal disease with hyperkalemia: seen and  examined on hemodialysis. 2K bath.   2. Ascites: schedule ultrasound guided paracentesis.   3. Hypertension: blood pressure at goal.  - furosemide and labetatolol.   4. Anemia of chronic kidney disease: hemoglobin 8.7 - EPO with HD treatment.   5. Secondary Hyperparathyroidism: - continue binders: sevelamer 2400mg  with meals.      LOS: Kennan, Stormy Connon 12/19/20182:22 PM

## 2017-03-01 NOTE — Progress Notes (Signed)
Stanley [name] at [place] NAME: Stanley [name]    MR#:  [id number]  DATE OF BIRTH:  1960/08/13  SUBJECTIVE:   Patient seen earlier this am missed HD for a few sessions  REVIEW OF SYSTEMS:    Review of Systems  Constitutional: Negative for fever, chills weight loss +generalized weakness HENT: Negative for ear pain, nosebleeds, congestion, facial swelling, rhinorrhea, neck pain, neck stiffness and ear discharge.   Respiratory: Negative for cough, ++shortness of breath, wheezing  Cardiovascular: Negative for chest pain, palpitations and leg swelling.  Gastrointestinal: Negative for heartburn, abdominal pain, vomiting, diarrhea or consitpation Genitourinary: Negative for dysuria, urgency, frequency, hematuria Musculoskeletal: Negative for back pain or joint pain Neurological: Negative for dizziness, seizures, syncope, focal weakness,  numbness and headaches.  Hematological: Does not bruise/bleed easily.  Psychiatric/Behavioral: Negative for hallucinations, confusion, dysphoric mood    Tolerating Diet: yes      DRUG ALLERGIES:  No Known Allergies  VITALS:  Blood pressure 119/75, pulse 74, temperature 98 F (36.7 C), temperature source Oral, resp. rate 12, height 6\' 3"  (1.905 m), weight 82.7 kg (182 lb 5.1 oz), SpO2 100 %.  PHYSICAL EXAMINATION:  Constitutional: Appears well-developed and well-nourished. No distress. HENT: Normocephalic. Marland Kitchen Oropharynx is clear and moist.  Eyes: Conjunctivae and EOM are normal. PERRLA, no scleral icterus.  Neck: Normal ROM. Neck supple. No JVD. No tracheal deviation. CVS: RRR, S1/S2 +, no murmurs, no gallops, no carotid bruit.  Pulmonary: Effort and breath sounds normal, no stridor, rhonchi, wheezes, rales.  Abdominal: Soft. BS +,  ++ distension,NO tenderness, rebound or guarding.  Musculoskeletal: Normal range of motion. 1+ LEE and no tenderness.  Neuro: Alert. CN 2-12 grossly intact. No focal  deficits. Skin: Skin is warm and dry. No rash noted. Psychiatric: Flat affect grumpy mood    LABORATORY PANEL:   CBC Recent Labs  Lab 03/01/17 0128  WBC 6.4  HGB 8.7*  HCT 27.0*  PLT 247   ------------------------------------------------------------------------------------------------------------------  Chemistries  Recent Labs  Lab 02/24/17 0026  03/01/17 0128  NA 137   < > 138  K 4.4   < > 6.5*  CL 99*   < > 97*  CO2 32   < > 29  GLUCOSE 109*   < > 93  BUN 24*   < > 73*  CREATININE 5.30*   < > 10.21*  CALCIUM 7.6*   < > 7.8*  MG 2.9*  --   --   AST 26   < > 61*  ALT 24   < > 40  ALKPHOS 163*   < > 195*  BILITOT 0.5   < > 0.5   < > = values in this interval not displayed.   ------------------------------------------------------------------------------------------------------------------  Cardiac Enzymes Recent Labs  Lab 02/22/17 1403 02/28/17 1731  TROPONINI <0.03 0.03*   ------------------------------------------------------------------------------------------------------------------  RADIOLOGY:  Dg Chest Portable 1 View  Result Date: 02/28/2017 CLINICAL DATA:  Generalized malaise.  Missed dialysis EXAM: PORTABLE CHEST 1 VIEW COMPARISON:  Six days ago FINDINGS: Chronic cardiopericardial enlargement. Status post CABG. Chronic vascular congestion and interstitial coarsening. No pneumonia, effusion, or definite Kerley lines. IMPRESSION: Cardiomegaly and vascular congestion similar to 6 days prior. Electronically Signed   By: Monte Fantasia M.D.   On: 02/28/2017 19:05     ASSESSMENT AND PLAN:   56 year old male with end-stage renal disease on hemodialysis who missed a couple dialysis sessions due to generalized weakness and found to have electrolyte abnormality.  1.  Hyperkalemia: This is due to the fact the patient missed dialysis. Patient will undergo dialysis to correct this Continue telemetry monitoring  2. Generalized weakness due to uremia and  hyperkalemia  3. [name]: Patient will undergo [facility] as per nephrology  4. EtOH abuse: CIWA protocol initiated  5. COPD without signs of exacerbation Continue inhalers and nebulizer when necessary  6. History of liver cirrhosis with ascites status post paracentesis on December 12 Monitor for need of another paracentesis. Continue lactulose 7. Hyperlipidemia: Continue statin   8. Tobacco dependence: Patient is encouraged to quit smoking. Counseling was provided for 4 minutes.   9. Anemia of chronic disease: Continue monitor hemoglobin  Management plans discussed with the patient and he is in agreement.  CODE STATUS: full  TOTAL TIME TAKING CARE OF THIS PATIENT: 30 minutes.     POSSIBLE D/C 2-4 days, DEPENDING ON CLINICAL CONDITION.   Temika Sutphin M.D on 03/01/2017 at 12:47 PM  Between 7am to 6pm - Pager - [phone] After 6pm go to [email or web address] - password [email or web address] [facility]  Office  [phone]  CC: Primary care physician; [name], MD  Note: This dictation was prepared with Dragon dictation along with smaller phrase technology. Any transcriptional errors that result from this process are unintentional.

## 2017-03-02 DIAGNOSIS — I5030 Unspecified diastolic (congestive) heart failure: Secondary | ICD-10-CM | POA: Diagnosis not present

## 2017-03-02 DIAGNOSIS — Q8781 Alport syndrome: Secondary | ICD-10-CM | POA: Diagnosis not present

## 2017-03-02 DIAGNOSIS — R188 Other ascites: Secondary | ICD-10-CM | POA: Diagnosis not present

## 2017-03-02 DIAGNOSIS — I2510 Atherosclerotic heart disease of native coronary artery without angina pectoris: Secondary | ICD-10-CM | POA: Diagnosis not present

## 2017-03-02 DIAGNOSIS — N186 End stage renal disease: Secondary | ICD-10-CM | POA: Diagnosis not present

## 2017-03-02 DIAGNOSIS — K761 Chronic passive congestion of liver: Secondary | ICD-10-CM | POA: Diagnosis not present

## 2017-03-02 LAB — CBC
HCT: 29.9 % — ABNORMAL LOW (ref 40.0–52.0)
HEMOGLOBIN: 9.3 g/dL — AB (ref 13.0–18.0)
MCH: 26.9 pg (ref 26.0–34.0)
MCHC: 30.9 g/dL — ABNORMAL LOW (ref 32.0–36.0)
MCV: 87.1 fL (ref 80.0–100.0)
Platelets: 249 10*3/uL (ref 150–440)
RBC: 3.44 MIL/uL — AB (ref 4.40–5.90)
RDW: 19.6 % — ABNORMAL HIGH (ref 11.5–14.5)
WBC: 4.7 10*3/uL (ref 3.8–10.6)

## 2017-03-02 LAB — BASIC METABOLIC PANEL
ANION GAP: 9 (ref 5–15)
BUN: 38 mg/dL — ABNORMAL HIGH (ref 6–20)
CALCIUM: 8.2 mg/dL — AB (ref 8.9–10.3)
CO2: 31 mmol/L (ref 22–32)
Chloride: 98 mmol/L — ABNORMAL LOW (ref 101–111)
Creatinine, Ser: 6.44 mg/dL — ABNORMAL HIGH (ref 0.61–1.24)
GFR calc non Af Amer: 9 mL/min — ABNORMAL LOW (ref >60)
GFR, EST AFRICAN AMERICAN: 10 mL/min — AB (ref >60)
Glucose, Bld: 86 mg/dL (ref 65–99)
POTASSIUM: 5.3 mmol/L — AB (ref 3.5–5.1)
Sodium: 138 mmol/L (ref 135–145)

## 2017-03-02 LAB — HEPATITIS B SURFACE ANTIGEN: Hepatitis B Surface Ag: NEGATIVE

## 2017-03-02 LAB — HEPATITIS B CORE ANTIBODY, TOTAL: Hep B Core Total Ab: NEGATIVE

## 2017-03-02 LAB — HEPATITIS B SURFACE ANTIBODY,QUALITATIVE: HEP B S AB: REACTIVE

## 2017-03-02 LAB — GLUCOSE, CAPILLARY: GLUCOSE-CAPILLARY: 107 mg/dL — AB (ref 65–99)

## 2017-03-02 NOTE — Progress Notes (Signed)
[facility] [place] NAME: Stanley [name]    MR#:  [id number]  DATE OF BIRTH:  04/20/1960  SUBJECTIVE:   Patient reports that he has an appointment at 8:30 this morning and needs to leave. He denies shortness of breath He is not interested in going to dialysis today which would be his regular schedule REVIEW OF SYSTEMS:    Review of Systems  Constitutional: Negative for fever, chills weight loss  HENT: Negative for ear pain, nosebleeds, congestion, facial swelling, rhinorrhea, neck pain, neck stiffness and ear discharge.   Respiratory: Negative for cough, no shortness of breath, wheezing  Cardiovascular: Negative for chest pain, palpitations and leg swelling.  Gastrointestinal: Negative for heartburn, abdominal pain, vomiting, diarrhea or consitpation Genitourinary: Negative for dysuria, urgency, frequency, hematuria Musculoskeletal: Negative for back pain or joint pain Neurological: Negative for dizziness, seizures, syncope, focal weakness,  numbness and headaches.  Hematological: Does not bruise/bleed easily.  Psychiatric/Behavioral: Negative for hallucinations, confusion, dysphoric mood    Tolerating Diet: yes      DRUG ALLERGIES:  No Known Allergies  VITALS:  Blood pressure 117/76, pulse 79, temperature 98.2 F (36.8 C), temperature source Oral, resp. rate 16, height 6\' 3"  (1.905 m), weight 74.8 kg (164 lb 14.4 oz), SpO2 100 %.  PHYSICAL EXAMINATION:  Constitutional: Appears well-developed and well-nourished. No distress. HENT: Normocephalic. Marland Kitchen Oropharynx is clear and moist.  Eyes: Conjunctivae and EOM are normal. PERRLA, no scleral icterus.  Neck: Normal ROM. Neck supple. No JVD. No tracheal deviation. CVS: RRR, S1/S2 +, no murmurs, no gallops, no carotid bruit.  Pulmonary: Effort and breath sounds normal, no stridor, rhonchi, wheezes, rales.  Abdominal: Soft. BS +,  no distension,NO tenderness, rebound or guarding.   Musculoskeletal: Normal range of motion. No lower extremity edema and no tenderness.  Neuro: Alert. CN 2-12 grossly intact. No focal deficits. Skin: Skin is warm and dry. No rash noted. Psychiatric: Normal mood    LABORATORY PANEL:   CBC Recent Labs  Lab 03/02/17 0454  WBC 4.7  HGB 9.3*  HCT 29.9*  PLT 249   ------------------------------------------------------------------------------------------------------------------  Chemistries  Recent Labs  Lab 02/24/17 0026  03/01/17 0128 03/02/17 0454  NA 137   < > 138 138  K 4.4   < > 6.5* 5.3*  CL 99*   < > 97* 98*  CO2 32   < > 29 31  GLUCOSE 109*   < > 93 86  BUN 24*   < > 73* 38*  CREATININE 5.30*   < > 10.21* 6.44*  CALCIUM 7.6*   < > 7.8* 8.2*  MG 2.9*  --   --   --   AST 26   < > 61*  --   ALT 24   < > 40  --   ALKPHOS 163*   < > 195*  --   BILITOT 0.5   < > 0.5  --    < > = values in this interval not displayed.   ------------------------------------------------------------------------------------------------------------------  Cardiac Enzymes Recent Labs  Lab 02/28/17 1731  TROPONINI 0.03*   ------------------------------------------------------------------------------------------------------------------  RADIOLOGY:  US Paracentesis  Result Date: 03/01/2017 INDICATION: Recurrent ascites. EXAM: ULTRASOUND GUIDED PARACENTESIS MEDICATIONS: None. COMPLICATIONS: None immediate. PROCEDURE: Informed written consent was obtained from the patient after a discussion of the risks, benefits and alternatives to treatment. A timeout was performed prior to the initiation of the procedure. Initial ultrasound scanning demonstrates a large amount of ascites within the right  lower abdominal quadrant. The right lower abdomen was prepped and draped in the usual sterile fashion. 1% lidocaine was used for local anesthesia. Following this, a 6 Fr Safe-T-Centesis catheter was introduced. An ultrasound image was saved for  documentation purposes. The paracentesis was performed. The catheter was removed and a dressing was applied. The patient tolerated the procedure well without immediate post procedural complication. FINDINGS: A total of approximately 4.5 L of amber colored fluid was removed. IMPRESSION: Successful ultrasound-guided paracentesis yielding 4.5 liters of peritoneal fluid. Electronically Signed   By: [name] M.D.   On: 03/01/2017 16:12   Dg Chest Portable 1 View  Result Date: 02/28/2017 CLINICAL DATA:  Generalized malaise.  Missed dialysis EXAM: PORTABLE CHEST 1 VIEW COMPARISON:  Six days ago FINDINGS: Chronic cardiopericardial enlargement. Status post CABG. Chronic vascular congestion and interstitial coarsening. No pneumonia, effusion, or definite Kerley lines. IMPRESSION: Cardiomegaly and vascular congestion similar to 6 days prior. Electronically Signed   By: Monte Fantasia M.D.   On: 02/28/2017 19:05     ASSESSMENT AND PLAN:   56 year old male with end-stage renal disease on hemodialysis who missed a couple dialysis sessions due to generalized weakness and found to have electrolyte abnormality.  1. Hyperkalemia: This is due to the fact the patient missed dialysis. Potassium is still 5.3. He needs dialysis today.   2. Generalized weakness due to uremia and hyperkalemia: Resolved  3. [name]: Patient had dialysis yesterday and will have dialysis today Tuesday, Thursday and Saturday  4. EtOH abuse: CIWA protocol initiated. Uneventful detox  5. COPD without signs of exacerbation Continue inhalers and nebulizer when necessary  6. History of liver cirrhosis with ascites status post paracentesis with 4 l of amber colored fluid removed. Continue lactulose 7. Hyperlipidemia: Continue statin   8. Tobacco dependence: Patient is encouraged to quit smoking. Counseling was provided for 4 minutes.   9. Anemia of chronic disease: Continue monitor  hemoglobin  Patient will need to leave AMA due to hyperkalemia he needs in-hospital dialysis. This was discussed with the patient and nursing.  Management plans discussed with the patient and he is in agreement.  CODE STATUS: full  TOTAL TIME TAKING CARE OF THIS PATIENT: 24 minutes.     POSSIBLE D/C today after HD or AMA  DEPENDING ON CLINICAL CONDITION.   Sharon Rubis M.D on 03/02/2017 at 7:18 AM  Between 7am to 6pm - Pager - [phone] After 6pm go to [email or web address] - password [email or web address] [facility]  Office  [phone]  CC: Primary care physician; [name], MD  Note: This dictation was prepared with Dragon dictation along with smaller phrase technology. Any transcriptional errors that result from this process are unintentional.

## 2017-03-02 NOTE — Progress Notes (Signed)
Patient verbalized wanting to talk with MD, plans to leave by 0730, "With or without orders".

## 2017-03-02 NOTE — Progress Notes (Signed)
Patient left AMA stating he had an appointment in [facility] that he could not miss. AMA form signed by patient, patient left before nurse could remove IV from left hand.

## 2017-03-02 NOTE — Discharge Summary (Signed)
[facility] NAME: Stanley [name]    MR#:  [id number]  DATE OF BIRTH:  Apr 01, 1960  DATE OF ADMISSION:  02/28/2017 ADMITTING PHYSICIAN: Lance Coon, MD  DATE OF DISCHARGE: 03/02/2017 patient left AMA  PRIMARY CARE PHYSICIAN: Revelo, Elyse Jarvis, MD    ADMISSION DIAGNOSIS:  Hypocalcemia [E83.51] Hyperkalemia [E87.5] Weakness [R53.1]  DISCHARGE DIAGNOSIS:  Principal Problem:   Weakness Active Problems:   Coronary atherosclerosis of native coronary artery   ESRD on dialysis (HCC)   Hyperkalemia   HTN (hypertension)   GERD (gastroesophageal reflux disease)   Cirrhosis of liver with ascites (HCC)   Alcohol abuse   COPD (chronic obstructive pulmonary disease) ([place])   SECONDARY DIAGNOSIS:   Past Medical History:  Diagnosis Date  . Alcohol abuse   . CHF (congestive heart failure) ([place])   . Cirrhosis ([place])   . Coronary artery disease 2009  . Diabetic peripheral neuropathy ([place])   . Drug abuse ([place])   . End stage renal disease on dialysis [facility]) NEPHROLOGIST-   DR [facility]  IN [place]   HEMODIALYSIS --   TUES/  THURS/  SAT  . GERD (gastroesophageal reflux disease)   . Hyperlipidemia   . Hypertension   . PAD (peripheral artery disease) ([place])   . Renal insufficiency    Per pt, 32 oz fluid restriction per day  . S/P triple vessel bypass 06/09/2016   2009ish  . Suicidal ideation    & HOMICIDAL IDEATION --  06-16-2013   ADMITTED TO [facility] COURSE:  56 year old male with end-stage renal disease on hemodialysis who missed a couple dialysis sessions due to generalized weakness and found to have electrolyte abnormality.  1. Hyperkalemia: This is due to the fact the patient missed dialysis. Potassium is still 5.3. He needs dialysis today.   2. Generalized weakness due to uremia and hyperkalemia: Resolved  3. [name]: Patient had dialysis yesterday and will have  dialysis today Tuesday, Thursday and Saturday  4. EtOH abuse: CIWA protocol initiated. Uneventful detox  5. COPD without signs of exacerbation Continue inhalers and nebulizer when necessary  6. History of liver cirrhosis with ascites status post paracentesis with 4 l of amber colored fluid removed. Continue lactulose 7. Hyperlipidemia: Continue statin   8. Tobacco dependence: Patient is encouraged to quit smoking. Counseling was provided for 4 minutes.   9. Anemia of chronic disease: Continue monitor hemoglobin  Patient will need to leave AMA due to hyperkalemia he needs in-hospital dialysis. This was discussed with the patient and nursing.     DISCHARGE CONDITIONS AND DIET:  Patient wanted to leave AMA Renal diet  CONSULTS OBTAINED:  Treatment Team:  Lavonia Dana, MD  DRUG ALLERGIES:  No Known Allergies  DISCHARGE MEDICATIONS:   Patient continued outpatient medications left AMA    Today   CHIEF COMPLAINT:  Patient wants to leave AMA he has an appontment with GI but his potassium still high   VITAL SIGNS:  Blood pressure 117/76, pulse 79, temperature 98.2 F (36.8 C), temperature source Oral, resp. rate 16, height 6\' 3"  (1.905 m), weight 74.8 kg (164 lb 14.4 oz), SpO2 100 %.   REVIEW OF SYSTEMS:  Review of Systems  Constitutional: Negative.  Negative for chills, fever and malaise/fatigue.  HENT: Negative.  Negative for ear discharge, ear pain, hearing loss, nosebleeds and sore throat.   Eyes: Negative.  Negative for blurred vision and pain.  Respiratory: Negative.  Negative for cough, hemoptysis, shortness of breath and wheezing.   Cardiovascular: Negative.  Negative for chest pain, palpitations and leg swelling.  Gastrointestinal: Negative.  Negative for abdominal pain, blood in stool, diarrhea, nausea and vomiting.  Genitourinary: Negative.  Negative for dysuria.  Musculoskeletal: Negative.  Negative for back pain.  Skin: Negative.    Neurological: Negative for dizziness, tremors, speech change, focal weakness, seizures and headaches.  Endo/Heme/Allergies: Negative.  Does not bruise/bleed easily.  Psychiatric/Behavioral: Negative.  Negative for depression, hallucinations and suicidal ideas.     PHYSICAL EXAMINATION:  GENERAL:  56 y.o.-year-old patient lying in the bed with no acute distress.  NECK:  Supple, no jugular venous distention. No thyroid enlargement, no tenderness.  LUNGS: Normal breath sounds bilaterally, no wheezing, rales,rhonchi  No use of accessory muscles of respiration.  CARDIOVASCULAR: S1, S2 normal. No murmurs, rubs, or gallops.  ABDOMEN: Soft, non-tender, non-distended. Bowel sounds present. No organomegaly or mass.  EXTREMITIES: No pedal edema, cyanosis, or clubbing.  PSYCHIATRIC: The patient is alert and oriented x 3.  SKIN: No obvious rash, lesion, or ulcer.   DATA REVIEW:   CBC Recent Labs  Lab 03/02/17 0454  WBC 4.7  HGB 9.3*  HCT 29.9*  PLT 249    Chemistries  Recent Labs  Lab 02/24/17 0026  03/01/17 0128 03/02/17 0454  NA 137   < > 138 138  K 4.4   < > 6.5* 5.3*  CL 99*   < > 97* 98*  CO2 32   < > 29 31  GLUCOSE 109*   < > 93 86  BUN 24*   < > 73* 38*  CREATININE 5.30*   < > 10.21* 6.44*  CALCIUM 7.6*   < > 7.8* 8.2*  MG 2.9*  --   --   --   AST 26   < > 61*  --   ALT 24   < > 40  --   ALKPHOS 163*   < > 195*  --   BILITOT 0.5   < > 0.5  --    < > = values in this interval not displayed.    Cardiac Enzymes Recent Labs  Lab 02/28/17 1731  TROPONINI 0.03*    Microbiology Results  @[email or web address] @  RADIOLOGY:  US Paracentesis  Result Date: 03/01/2017 INDICATION: Recurrent ascites. EXAM: ULTRASOUND GUIDED PARACENTESIS MEDICATIONS: None. COMPLICATIONS: None immediate. PROCEDURE: Informed written consent was obtained from the patient after a discussion of the risks, benefits and alternatives to treatment. A timeout was performed prior to the initiation of the  procedure. Initial ultrasound scanning demonstrates a large amount of ascites within the right lower abdominal quadrant. The right lower abdomen was prepped and draped in the usual sterile fashion. 1% lidocaine was used for local anesthesia. Following this, a 6 Fr Safe-T-Centesis catheter was introduced. An ultrasound image was saved for documentation purposes. The paracentesis was performed. The catheter was removed and a dressing was applied. The patient tolerated the procedure well without immediate post procedural complication. FINDINGS: A total of approximately 4.5 L of amber colored fluid was removed. IMPRESSION: Successful ultrasound-guided paracentesis yielding 4.5 liters of peritoneal fluid. Electronically Signed   By: [name] M.D.   On: 03/01/2017 16:12   Dg Chest Portable 1 View  Result Date: 02/28/2017 CLINICAL DATA:  Generalized malaise.  Missed dialysis EXAM: PORTABLE CHEST 1 VIEW COMPARISON:  Six days ago FINDINGS: Chronic cardiopericardial enlargement. Status post CABG. Chronic vascular congestion and interstitial coarsening. No pneumonia, effusion, or  definite [employer]. IMPRESSION: Cardiomegaly and vascular congestion similar to 6 days prior. Electronically Signed   By: Monte Fantasia M.D.   On: 02/28/2017 19:05      Allergies as of 03/02/2017   No Known Allergies     Medication List    TAKE these medications   albuterol 108 (90 Base) MCG/ACT inhaler Commonly known as:  PROVENTIL HFA;VENTOLIN HFA Inhale 2 puffs into the lungs every 6 (six) hours as needed for wheezing or shortness of breath.   alum & mag hydroxide-simeth 200-200-20 MG/5ML suspension Commonly known as:  MAALOX/MYLANTA Take 15 mLs every 4 (four) hours as needed by mouth for indigestion or heartburn.   aspirin 81 MG EC tablet Take 81 mg by mouth daily.   atorvastatin 40 MG tablet Commonly known as:  LIPITOR Take 1 tablet (40 mg total) by mouth daily.   budesonide-formoterol 160-4.5 MCG/ACT  inhaler Commonly known as:  SYMBICORT Inhale 2 puffs into the lungs daily.   DIALYVITE SUPREME D 3 MG Tabs Take 1 tablet (3 mg total) by mouth daily.   ferrous sulfate 325 (65 FE) MG tablet Take 1 tablet by mouth 3 (three) times daily.   furosemide 80 MG tablet Commonly known as:  LASIX Take 1 tablet (80 mg total) daily by mouth.   gabapentin 300 MG capsule Commonly known as:  NEURONTIN TAKE 1 CAPSULE BY MOUTH EVERY EVENING AND 1 EXTRA CAP ON DIALYSIS DAYS AS NEEDED FOR NERVE PAIN   ipratropium-albuterol 0.5-2.5 (3) MG/3ML Soln Commonly known as:  DUONEB Take 3 mLs by nebulization every 6 (six) hours as needed.   labetalol 100 MG tablet Commonly known as:  NORMODYNE Take 1 tablet (100 mg total) by mouth daily.   lactulose 10 GM/15ML solution Commonly known as:  CHRONULAC Take 30 mLs (20 g total) by mouth 3 (three) times daily.   nicotine 21 mg/24hr patch Commonly known as:  NICODERM CQ - dosed in mg/24 hours Place 1 patch (21 mg total) onto the skin daily.   nitroGLYCERIN 0.4 MG SL tablet Commonly known as:  NITROSTAT Place 1 tablet (0.4 mg total) under the tongue every 5 (five) minutes as needed.   pantoprazole 40 MG tablet Commonly known as:  PROTONIX Take 1 tablet (40 mg total) by mouth daily.   sevelamer carbonate 800 MG tablet Commonly known as:  RENVELA Take 3 tablets (2,400 mg total) by mouth 3 (three) times daily with meals.   Spacer/Aero Chamber Mouthpiece Misc 1 Units by Does not apply route every 4 (four) hours as needed (wheezing).   tiotropium 18 MCG inhalation capsule Commonly known as:  SPIRIVA HANDIHALER Place 1 capsule (18 mcg total) into inhaler and inhale daily.         Management plans discussed with the patient and he is in agreement. Stable for discharge left AMA  Patient should follow up with pcp  CODE STATUS:     Code Status Orders  (From admission, onward)        Start     Ordered   02/28/17 2255  Full code  Continuous      02/28/17 2254    Code Status History    Date Active Date Inactive Code Status Order ID Comments User Context   02/22/2017 17:21 02/24/2017 13:59 Full Code [id number]  Dustin Flock, MD Inpatient   01/22/2017 23:37 01/25/2017 15:22 Full Code [id number]  Gorden Harms, MD Inpatient   01/07/2017 14:45 01/13/2017 18:12 Full Code [id number]  Idelle Crouch, MD Inpatient  12/22/2016 14:31 12/24/2016 18:04 Full Code [id number]  Hillary Bow, MD ED   12/20/2016 20:20 12/21/2016 18:53 Full Code [id number]  Demetrios Loll, MD Inpatient   11/26/2016 10:30 11/28/2016 15:22 Full Code [id number]  Henreitta Leber, MD Inpatient   11/15/2016 19:30 11/16/2016 18:22 Full Code [id number]  Fritzi Mandes, MD Inpatient   10/29/2016 14:53 10/30/2016 15:06 Full Code [id number]  Loletha Grayer, MD ED   09/15/2016 17:57 09/16/2016 18:17 Full Code [id number]  Dustin Flock, MD ED   08/16/2016 09:45 08/18/2016 18:16 Full Code [id number]  Hillary Bow, MD ED   08/09/2016 16:59 08/12/2016 20:34 Full Code [id number]  Fritzi Mandes, MD Inpatient   07/03/2016 21:54 07/05/2016 21:22 Full Code [id number]  Demetrios Loll, MD Inpatient   06/24/2016 04:23 06/26/2016 15:46 Full Code [id number]  Saundra Shelling, MD Inpatient   06/14/2016 14:53 06/19/2016 21:04 Full Code [id number]  Hillary Bow, MD ED   05/31/2016 06:42 06/01/2016 21:30 Full Code [id number]  Saundra Shelling, MD Inpatient   05/05/2016 23:09 05/10/2016 21:57 Full Code [id number]  Vianne Bulls, MD Inpatient   04/16/2016 03:56 04/17/2016 18:27 Full Code [id number]  Saundra Shelling, MD Inpatient   01/19/2016 15:13 01/22/2016 14:14 Full Code [id number]  Loletha Grayer, MD ED   09/24/2015 10:33 09/25/2015 13:31 Full Code [id number]  Loletha Grayer, MD ED   05/16/2015 14:17 05/17/2015 20:57 Full Code [id number]  Idelle Crouch, MD Inpatient   04/30/2015 20:08 05/08/2015 19:40 Full Code [id number]  Vaughan Basta, MD Inpatient   04/06/2015 05:07 04/07/2015 17:36 Full Code [id number]  Lance Coon, MD Inpatient    03/23/2015 11:07 03/27/2015 12:46 Full Code [id number]  Bettey Costa, MD Inpatient   06/19/2013 11:32 06/21/2013 17:49 Full Code [id number]  Cristal Ford, DO Inpatient   06/16/2013 17:26 06/19/2013 11:32 Full Code [id number]  Margarita Mail, PA-C ED   06/05/2013 15:33 06/06/2013 18:56 Full Code [id number]  Theodoro Kos, DO Inpatient   06/02/2013 09:01 06/05/2013 15:33 Full Code [id number]  Velvet Bathe, MD Inpatient   06/02/2013 08:40 06/02/2013 09:01 Full Code [id number]  Velvet Bathe, MD Inpatient      TOTAL TIME TAKING CARE OF THIS PATIENT: 38 minutes.    Note: This dictation was prepared with Dragon dictation along with smaller phrase technology. Any transcriptional errors that result from this process are unintentional.  Yulitza Shorts M.D on 03/02/2017 at 7:37 AM  Between 7am to 6pm - Pager - [phone] After 6pm go to [email or web address] - password [email or web address] [facility]  Office  [phone]  CC: Primary care physician; [name], MD

## 2017-03-02 NOTE — Progress Notes (Signed)
Patient reported having an early morning appointment in [facility].  RN informed on call MD

## 2017-03-03 ENCOUNTER — Inpatient Hospital Stay
Admission: EM | Admit: 2017-03-03 | Discharge: 2017-03-04 | DRG: 640 | Disposition: A | Discharge status: Home / Self Care | Payer: Medicare Other | Attending: Internal Medicine | Admitting: Internal Medicine

## 2017-03-03 ENCOUNTER — Other Ambulatory Visit: Payer: Self-pay

## 2017-03-03 ENCOUNTER — Encounter: Payer: Self-pay | Admitting: *Deleted

## 2017-03-03 DIAGNOSIS — I132 Hypertensive heart and chronic kidney disease with heart failure and with stage 5 chronic kidney disease, or end stage renal disease: Secondary | ICD-10-CM | POA: Diagnosis present

## 2017-03-03 DIAGNOSIS — E1142 Type 2 diabetes mellitus with diabetic polyneuropathy: Secondary | ICD-10-CM | POA: Diagnosis present

## 2017-03-03 DIAGNOSIS — Q8781 Alport syndrome: Secondary | ICD-10-CM

## 2017-03-03 DIAGNOSIS — Z9111 Patient's noncompliance with dietary regimen: Secondary | ICD-10-CM

## 2017-03-03 DIAGNOSIS — N186 End stage renal disease: Secondary | ICD-10-CM | POA: Diagnosis not present

## 2017-03-03 DIAGNOSIS — Z841 Family history of disorders of kidney and ureter: Secondary | ICD-10-CM

## 2017-03-03 DIAGNOSIS — Z79899 Other long term (current) drug therapy: Secondary | ICD-10-CM

## 2017-03-03 DIAGNOSIS — Z9115 Patient's noncompliance with renal dialysis: Secondary | ICD-10-CM

## 2017-03-03 DIAGNOSIS — R188 Other ascites: Secondary | ICD-10-CM | POA: Diagnosis present

## 2017-03-03 DIAGNOSIS — D631 Anemia in chronic kidney disease: Secondary | ICD-10-CM | POA: Diagnosis not present

## 2017-03-03 DIAGNOSIS — R609 Edema, unspecified: Secondary | ICD-10-CM | POA: Diagnosis not present

## 2017-03-03 DIAGNOSIS — E1122 Type 2 diabetes mellitus with diabetic chronic kidney disease: Secondary | ICD-10-CM | POA: Diagnosis present

## 2017-03-03 DIAGNOSIS — E8779 Other fluid overload: Secondary | ICD-10-CM | POA: Diagnosis present

## 2017-03-03 DIAGNOSIS — E1151 Type 2 diabetes mellitus with diabetic peripheral angiopathy without gangrene: Secondary | ICD-10-CM | POA: Diagnosis present

## 2017-03-03 DIAGNOSIS — F17210 Nicotine dependence, cigarettes, uncomplicated: Secondary | ICD-10-CM | POA: Diagnosis present

## 2017-03-03 DIAGNOSIS — Z951 Presence of aortocoronary bypass graft: Secondary | ICD-10-CM | POA: Diagnosis not present

## 2017-03-03 DIAGNOSIS — E785 Hyperlipidemia, unspecified: Secondary | ICD-10-CM | POA: Diagnosis present

## 2017-03-03 DIAGNOSIS — I5033 Acute on chronic diastolic (congestive) heart failure: Secondary | ICD-10-CM | POA: Diagnosis present

## 2017-03-03 DIAGNOSIS — I2510 Atherosclerotic heart disease of native coronary artery without angina pectoris: Secondary | ICD-10-CM | POA: Diagnosis present

## 2017-03-03 DIAGNOSIS — K7030 Alcoholic cirrhosis of liver without ascites: Secondary | ICD-10-CM | POA: Diagnosis present

## 2017-03-03 DIAGNOSIS — J449 Chronic obstructive pulmonary disease, unspecified: Secondary | ICD-10-CM | POA: Diagnosis present

## 2017-03-03 DIAGNOSIS — K7290 Hepatic failure, unspecified without coma: Secondary | ICD-10-CM | POA: Diagnosis present

## 2017-03-03 DIAGNOSIS — I10 Essential (primary) hypertension: Secondary | ICD-10-CM | POA: Diagnosis not present

## 2017-03-03 DIAGNOSIS — F1010 Alcohol abuse, uncomplicated: Secondary | ICD-10-CM | POA: Diagnosis present

## 2017-03-03 DIAGNOSIS — Z7982 Long term (current) use of aspirin: Secondary | ICD-10-CM

## 2017-03-03 DIAGNOSIS — N2581 Secondary hyperparathyroidism of renal origin: Secondary | ICD-10-CM | POA: Diagnosis not present

## 2017-03-03 DIAGNOSIS — K219 Gastro-esophageal reflux disease without esophagitis: Secondary | ICD-10-CM | POA: Diagnosis present

## 2017-03-03 DIAGNOSIS — E8770 Fluid overload, unspecified: Secondary | ICD-10-CM | POA: Diagnosis not present

## 2017-03-03 DIAGNOSIS — Z7951 Long term (current) use of inhaled steroids: Secondary | ICD-10-CM

## 2017-03-03 DIAGNOSIS — Z992 Dependence on renal dialysis: Secondary | ICD-10-CM | POA: Diagnosis not present

## 2017-03-03 DIAGNOSIS — E875 Hyperkalemia: Secondary | ICD-10-CM | POA: Diagnosis not present

## 2017-03-03 DIAGNOSIS — Z800 Family history of malignant neoplasm of digestive organs: Secondary | ICD-10-CM

## 2017-03-03 DIAGNOSIS — I120 Hypertensive chronic kidney disease with stage 5 chronic kidney disease or end stage renal disease: Secondary | ICD-10-CM | POA: Diagnosis not present

## 2017-03-03 LAB — CBC
HCT: 26.6 % — ABNORMAL LOW (ref 40.0–52.0)
HEMOGLOBIN: 8.2 g/dL — AB (ref 13.0–18.0)
MCH: 26.6 pg (ref 26.0–34.0)
MCHC: 30.8 g/dL — ABNORMAL LOW (ref 32.0–36.0)
MCV: 86.4 fL (ref 80.0–100.0)
PLATELETS: 224 10*3/uL (ref 150–440)
RBC: 3.08 MIL/uL — AB (ref 4.40–5.90)
RDW: 20.1 % — ABNORMAL HIGH (ref 11.5–14.5)
WBC: 5.1 10*3/uL (ref 3.8–10.6)

## 2017-03-03 LAB — COMPREHENSIVE METABOLIC PANEL
ALBUMIN: 2.7 g/dL — AB (ref 3.5–5.0)
ALK PHOS: 247 U/L — AB (ref 38–126)
ALT: 71 U/L — AB (ref 17–63)
ANION GAP: 11 (ref 5–15)
AST: 70 U/L — ABNORMAL HIGH (ref 15–41)
BUN: 56 mg/dL — ABNORMAL HIGH (ref 6–20)
CALCIUM: 8.4 mg/dL — AB (ref 8.9–10.3)
CHLORIDE: 98 mmol/L — AB (ref 101–111)
CO2: 31 mmol/L (ref 22–32)
Creatinine, Ser: 8.29 mg/dL — ABNORMAL HIGH (ref 0.61–1.24)
GFR calc Af Amer: 7 mL/min — ABNORMAL LOW (ref >60)
GFR calc non Af Amer: 6 mL/min — ABNORMAL LOW (ref >60)
GLUCOSE: 129 mg/dL — AB (ref 65–99)
Potassium: 6.2 mmol/L — ABNORMAL HIGH (ref 3.5–5.1)
SODIUM: 140 mmol/L (ref 135–145)
Total Bilirubin: 0.6 mg/dL (ref 0.3–1.2)
Total Protein: 6.2 g/dL — ABNORMAL LOW (ref 6.5–8.1)

## 2017-03-03 LAB — GLUCOSE, CAPILLARY: GLUCOSE-CAPILLARY: 120 mg/dL — AB (ref 65–99)

## 2017-03-03 MED ORDER — TIOTROPIUM BROMIDE MONOHYDRATE 18 MCG IN CAPS
18.0000 ug | ORAL_CAPSULE | Freq: Every day | RESPIRATORY_TRACT | Status: DC
  Administered 2017-03-03 – 2017-03-04 (×2): 18 ug via RESPIRATORY_TRACT
  Filled 2017-03-03: qty 5

## 2017-03-03 MED ORDER — VITAMIN B-1 100 MG PO TABS
100.0000 mg | ORAL_TABLET | Freq: Every day | ORAL | Status: DC
  Administered 2017-03-04: 100 mg via ORAL
  Filled 2017-03-03: qty 1

## 2017-03-03 MED ORDER — SPACER/AERO CHAMBER MOUTHPIECE MISC: 1.0000 [IU] | Status: DC | PRN

## 2017-03-03 MED ORDER — ADULT MULTIVITAMIN W/MINERALS CH: 1.0000 | ORAL_TABLET | Freq: Every day | ORAL | Status: DC

## 2017-03-03 MED ORDER — ACETAMINOPHEN 325 MG PO TABS: 650.0000 mg | ORAL_TABLET | Freq: Four times a day (QID) | ORAL | Status: DC | PRN

## 2017-03-03 MED ORDER — LORAZEPAM 1 MG PO TABS: 1.0000 mg | ORAL_TABLET | Freq: Four times a day (QID) | ORAL | Status: DC | PRN

## 2017-03-03 MED ORDER — ONDANSETRON HCL 4 MG/2ML IJ SOLN
4.0000 mg | Freq: Four times a day (QID) | INTRAMUSCULAR | Status: DC | PRN
  Administered 2017-03-04: 4 mg via INTRAVENOUS
  Filled 2017-03-03: qty 2

## 2017-03-03 MED ORDER — PANTOPRAZOLE SODIUM 40 MG PO TBEC
40.0000 mg | DELAYED_RELEASE_TABLET | Freq: Every day | ORAL | Status: DC
  Administered 2017-03-03 – 2017-03-04 (×2): 40 mg via ORAL
  Filled 2017-03-03 (×2): qty 1

## 2017-03-03 MED ORDER — LABETALOL HCL 100 MG PO TABS
100.0000 mg | ORAL_TABLET | Freq: Every day | ORAL | Status: DC
  Administered 2017-03-03: 100 mg via ORAL
  Filled 2017-03-03 (×2): qty 1

## 2017-03-03 MED ORDER — ALUM & MAG HYDROXIDE-SIMETH 200-200-20 MG/5ML PO SUSP: 15.0000 mL | ORAL | Status: DC | PRN

## 2017-03-03 MED ORDER — HEPARIN SODIUM (PORCINE) 5000 UNIT/ML IJ SOLN
5000.0000 [IU] | Freq: Three times a day (TID) | INTRAMUSCULAR | Status: DC
  Administered 2017-03-03 – 2017-03-04 (×2): 5000 [IU] via SUBCUTANEOUS
  Filled 2017-03-03 (×2): qty 1

## 2017-03-03 MED ORDER — ASPIRIN EC 81 MG PO TBEC
81.0000 mg | DELAYED_RELEASE_TABLET | Freq: Every day | ORAL | Status: DC
  Administered 2017-03-04: 81 mg via ORAL
  Filled 2017-03-03: qty 1

## 2017-03-03 MED ORDER — MOMETASONE FURO-FORMOTEROL FUM 200-5 MCG/ACT IN AERO
2.0000 | INHALATION_SPRAY | Freq: Two times a day (BID) | RESPIRATORY_TRACT | Status: DC
  Administered 2017-03-03 – 2017-03-04 (×2): 2 via RESPIRATORY_TRACT
  Filled 2017-03-03: qty 8.8

## 2017-03-03 MED ORDER — SODIUM CHLORIDE 0.9% FLUSH: 3.0000 mL | INTRAVENOUS | Status: DC | PRN

## 2017-03-03 MED ORDER — SODIUM CHLORIDE 0.9% FLUSH
3.0000 mL | Freq: Two times a day (BID) | INTRAVENOUS | Status: DC
  Administered 2017-03-03: 3 mL via INTRAVENOUS

## 2017-03-03 MED ORDER — ACETAMINOPHEN 650 MG RE SUPP: 650.0000 mg | Freq: Four times a day (QID) | RECTAL | Status: DC | PRN

## 2017-03-03 MED ORDER — SEVELAMER CARBONATE 800 MG PO TABS
2400.0000 mg | ORAL_TABLET | Freq: Three times a day (TID) | ORAL | Status: DC
  Administered 2017-03-04: 2400 mg via ORAL
  Filled 2017-03-03: qty 3

## 2017-03-03 MED ORDER — SENNOSIDES-DOCUSATE SODIUM 8.6-50 MG PO TABS: 1.0000 | ORAL_TABLET | Freq: Every evening | ORAL | Status: DC | PRN

## 2017-03-03 MED ORDER — ASPIRIN 81 MG PO TBEC: 81.0000 mg | DELAYED_RELEASE_TABLET | Freq: Every day | ORAL | Status: DC

## 2017-03-03 MED ORDER — THIAMINE HCL 100 MG/ML IJ SOLN: 100.0000 mg | Freq: Every day | INTRAMUSCULAR | Status: DC

## 2017-03-03 MED ORDER — ATORVASTATIN CALCIUM 20 MG PO TABS
40.0000 mg | ORAL_TABLET | Freq: Every day | ORAL | Status: DC
  Administered 2017-03-04: 40 mg via ORAL
  Filled 2017-03-03: qty 2

## 2017-03-03 MED ORDER — SODIUM CHLORIDE 0.9 % IV SOLN: 250.0000 mL | INTRAVENOUS | Status: DC | PRN

## 2017-03-03 MED ORDER — RENA-VITE PO TABS
1.0000 | ORAL_TABLET | Freq: Every day | ORAL | Status: DC
  Administered 2017-03-03: 1 via ORAL
  Filled 2017-03-03: qty 1

## 2017-03-03 MED ORDER — LORAZEPAM 2 MG/ML IJ SOLN: 1.0000 mg | Freq: Four times a day (QID) | INTRAMUSCULAR | Status: DC | PRN

## 2017-03-03 MED ORDER — NITROGLYCERIN 0.4 MG SL SUBL: 0.4000 mg | SUBLINGUAL_TABLET | SUBLINGUAL | Status: DC | PRN

## 2017-03-03 MED ORDER — ONDANSETRON HCL 4 MG PO TABS
4.0000 mg | ORAL_TABLET | Freq: Four times a day (QID) | ORAL | Status: DC | PRN
  Filled 2017-03-03: qty 1

## 2017-03-03 MED ORDER — FOLIC ACID 1 MG PO TABS
1.0000 mg | ORAL_TABLET | Freq: Every day | ORAL | Status: DC
  Administered 2017-03-04: 1 mg via ORAL
  Filled 2017-03-03: qty 1

## 2017-03-03 MED ORDER — HYDROCODONE-ACETAMINOPHEN 5-325 MG PO TABS
1.0000 | ORAL_TABLET | ORAL | Status: DC | PRN
  Administered 2017-03-03 – 2017-03-04 (×2): 2 via ORAL
  Filled 2017-03-03 (×2): qty 2

## 2017-03-03 MED ORDER — SODIUM POLYSTYRENE SULFONATE 15 GM/60ML PO SUSP
30.0000 g | Freq: Once | ORAL | Status: AC
  Administered 2017-03-03: 30 g via ORAL
  Filled 2017-03-03: qty 120

## 2017-03-03 MED ORDER — LACTULOSE 10 GM/15ML PO SOLN
20.0000 g | Freq: Three times a day (TID) | ORAL | Status: DC
  Administered 2017-03-03: 20 g via ORAL
  Filled 2017-03-03 (×2): qty 30

## 2017-03-03 MED ORDER — DIALYVITE SUPREME D 3 MG PO TABS: 1.0000 | ORAL_TABLET | Freq: Every day | ORAL | Status: DC

## 2017-03-03 MED ORDER — ALBUTEROL SULFATE (2.5 MG/3ML) 0.083% IN NEBU
2.5000 mg | INHALATION_SOLUTION | RESPIRATORY_TRACT | Status: DC | PRN
Start: 2017-03-03 — End: 2017-03-04

## 2017-03-03 MED ORDER — BISACODYL 5 MG PO TBEC: 5.0000 mg | DELAYED_RELEASE_TABLET | Freq: Every day | ORAL | Status: DC | PRN

## 2017-03-03 MED ORDER — FERROUS SULFATE 325 (65 FE) MG PO TABS
325.0000 mg | ORAL_TABLET | Freq: Three times a day (TID) | ORAL | Status: DC
  Administered 2017-03-03 – 2017-03-04 (×2): 325 mg via ORAL
  Filled 2017-03-03 (×2): qty 1

## 2017-03-03 MED ORDER — NICOTINE 21 MG/24HR TD PT24
21.0000 mg | MEDICATED_PATCH | Freq: Every day | TRANSDERMAL | Status: DC
  Filled 2017-03-03 (×2): qty 1

## 2017-03-03 MED ORDER — FUROSEMIDE 40 MG PO TABS
80.0000 mg | ORAL_TABLET | Freq: Every day | ORAL | Status: DC
  Administered 2017-03-03: 80 mg via ORAL
  Filled 2017-03-03: qty 2

## 2017-03-03 NOTE — H&P (Signed)
[place] [facility] [address] NAME: Stanley [name]    MR#:  [id number]  DATE OF BIRTH:  25-Aug-1960  DATE OF ADMISSION:  03/03/2017  PRIMARY CARE PHYSICIAN: Alene Mires Elyse Jarvis, MD   REQUESTING/REFERRING PHYSICIAN: Harvest Dark, MD  CHIEF COMPLAINT:   Chief Complaint  Patient presents with  . Foot Swelling   Needed dialysis. HISTORY OF PRESENT ILLNESS:  Stanley [name]  is a 56 y.o. male with a known history of multiple medical problems as below.  The patient comes to the ED due to no place to get hemodialysis.  He denies any symptoms.  He was just discharged yesterday from the hospital after treatment of hyperkalemia.  He ED, he was found potassium 6.2.  ED physician discussed with the nephrologist who suggested admit the patient to get hemodialysis tomorrow.  PAST MEDICAL HISTORY:   Past Medical History:  Diagnosis Date  . Alcohol abuse   . CHF (congestive heart failure) ([place])   . Cirrhosis ([place])   . Coronary artery disease 2009  . Diabetic peripheral neuropathy ([place])   . Drug abuse ([place])   . End stage renal disease on dialysis [facility]) NEPHROLOGIST-   DR [facility]  IN [place]   HEMODIALYSIS --   TUES/  THURS/  SAT  . GERD (gastroesophageal reflux disease)   . Hyperlipidemia   . Hypertension   . PAD (peripheral artery disease) ([place])   . Renal insufficiency    Per pt, 32 oz fluid restriction per day  . S/P triple vessel bypass 06/09/2016   2009ish  . Suicidal ideation    & HOMICIDAL IDEATION --  06-16-2013   ADMITTED TO BEHAVIOR HEALTH    PAST SURGICAL HISTORY:   Past Surgical History:  Procedure Laterality Date  . AGILE CAPSULE N/A 06/19/2016   Procedure: AGILE CAPSULE;  Surgeon: [name], MD;  Location: [facility] ENDOSCOPY;  Service: Endoscopy;  Laterality: N/A;  . COLONOSCOPY WITH PROPOFOL N/A 06/18/2016   Procedure: COLONOSCOPY WITH PROPOFOL;  Surgeon: [name], MD;  Location: [facility] ENDOSCOPY;  Service: Endoscopy;   Laterality: N/A;  . COLONOSCOPY WITH PROPOFOL N/A 08/12/2016   Procedure: COLONOSCOPY WITH PROPOFOL;  Surgeon: [name], MD;  Location: [facility] ENDOSCOPY;  Service: Endoscopy;  Laterality: N/A;  . CORONARY ANGIOPLASTY  ?   PT UNABLE TO TELL IF  BEFORE OR AFTER  CABG  . CORONARY ARTERY BYPASS GRAFT  2008  ([place])   3 VESSEL  . DIALYSIS FISTULA CREATION  LAST SURGERY  APPOX  2008  . ENTEROSCOPY N/A 05/10/2016   Procedure: ENTEROSCOPY;  Surgeon: Jerene Bears, MD;  Location: [place];  Service: Gastroenterology;  Laterality: N/A;  . ENTEROSCOPY N/A 08/12/2016   Procedure: ENTEROSCOPY;  Surgeon: [name], MD;  Location: [facility] ENDOSCOPY;  Service: Endoscopy;  Laterality: N/A;  . ESOPHAGOGASTRODUODENOSCOPY N/A 05/07/2015   Procedure: ESOPHAGOGASTRODUODENOSCOPY (EGD);  Surgeon: Hulen Luster, MD;  Location[facility] ENDOSCOPY;  Service: Endoscopy;  Laterality: N/A;  . ESOPHAGOGASTRODUODENOSCOPY (EGD) WITH PROPOFOL N/A 05/17/2015   Procedure: ESOPHAGOGASTRODUODENOSCOPY (EGD) WITH PROPOFOL;  Surgeon: [name], MD;  Location: [facility] ENDOSCOPY;  Service: Endoscopy;  Laterality: N/A;  . ESOPHAGOGASTRODUODENOSCOPY (EGD) WITH PROPOFOL N/A 01/20/2016   Procedure: ESOPHAGOGASTRODUODENOSCOPY (EGD) WITH PROPOFOL;  Surgeon: [name], MD;  Location: [facility] ENDOSCOPY;  Service: Endoscopy;  Laterality: N/A;  . ESOPHAGOGASTRODUODENOSCOPY (EGD) WITH PROPOFOL N/A 04/17/2016   Procedure: ESOPHAGOGASTRODUODENOSCOPY (EGD) WITH PROPOFOL;  Surgeon: Lin Landsman, MD;  Location: [facility] ENDOSCOPY;  Service: Gastroenterology;  Laterality:  N/A;  . ESOPHAGOGASTRODUODENOSCOPY (EGD) WITH PROPOFOL  05/09/2016   Procedure: ESOPHAGOGASTRODUODENOSCOPY (EGD) WITH PROPOFOL;  Surgeon: Jerene Bears, MD;  Location: [facility] ENDOSCOPY;  Service: Endoscopy;;  . ESOPHAGOGASTRODUODENOSCOPY (EGD) WITH PROPOFOL N/A 06/16/2016   Procedure: ESOPHAGOGASTRODUODENOSCOPY (EGD) WITH PROPOFOL;  Surgeon: [name], MD;  Location: [facility] ENDOSCOPY;  Service:  Endoscopy;  Laterality: N/A;  . GIVENS CAPSULE STUDY N/A 05/07/2016   Procedure: GIVENS CAPSULE STUDY;  Surgeon: Doran Stabler, MD;  Location: [facility];  Service: Endoscopy;  Laterality: N/A;  . MANDIBULAR HARDWARE REMOVAL N/A 07/29/2013   Procedure: REMOVAL OF ARCH BARS;  Surgeon: Theodoro Kos, DO;  Location: [place];  Service: Plastics;  Laterality: N/A;  . ORIF MANDIBULAR FRACTURE N/A 06/05/2013   Procedure: REPAIR OF MANDIBULAR FRACTURE x 2 with maxillo-mandibular fixation ;  Surgeon: Theodoro Kos, DO;  Location: [place];  Service: Plastics;  Laterality: N/A;  . PERIPHERAL ARTERIAL STENT GRAFT Left     SOCIAL HISTORY:   Social History   Tobacco Use  . Smoking status: Current Every Day Smoker    Packs/day: 0.15    Years: 40.00    Pack years: 6.00    Types: Cigarettes  . Smokeless tobacco: Never Used  Substance Use Topics  . Alcohol use: No    Comment: pt reports quitting after learning about cirrhosis    FAMILY HISTORY:   Family History  Problem Relation Age of Onset  . Colon cancer Mother   . Cancer Father   . Cancer Sister   . Kidney disease Brother     DRUG ALLERGIES:  No Known Allergies  REVIEW OF SYSTEMS:   Review of Systems  Constitutional: Negative for chills, fever and malaise/fatigue.  HENT: Negative for sore throat.   Eyes: Negative for blurred vision and double vision.  Respiratory: Negative for cough, hemoptysis, shortness of breath, wheezing and stridor.   Cardiovascular: Negative for chest pain, palpitations, orthopnea and leg swelling.  Gastrointestinal: Negative for abdominal pain, blood in stool, diarrhea, melena, nausea and vomiting.  Genitourinary: Negative for dysuria, flank pain and hematuria.  Musculoskeletal: Negative for back pain and joint pain.  Neurological: Negative for dizziness, sensory change, focal weakness, seizures, loss of consciousness, weakness and headaches.  Endo/Heme/Allergies: Negative for  polydipsia.  Psychiatric/Behavioral: Negative for depression. The patient is not nervous/anxious.     MEDICATIONS AT HOME:   Prior to Admission medications   Medication Sig Start Date End Date Taking? Authorizing Provider  albuterol (PROVENTIL HFA;VENTOLIN HFA) 108 (90 Base) MCG/ACT inhaler Inhale 2 puffs into the lungs every 6 (six) hours as needed for wheezing or shortness of breath. 02/19/17  Yes Darel Hong, MD  alum & mag hydroxide-simeth (MAALOX/MYLANTA) 200-200-20 MG/5ML suspension Take 15 mLs every 4 (four) hours as needed by mouth for indigestion or heartburn. 01/25/17  Yes Demetrios Loll, MD  aspirin 81 MG EC tablet Take 81 mg by mouth daily. 11/17/16  Yes [provider]  atorvastatin (LIPITOR) 40 MG tablet Take 1 tablet (40 mg total) by mouth daily. 11/16/16  Yes Vaughan Basta, MD  budesonide-formoterol [facility]) 160-4.5 MCG/ACT inhaler Inhale 2 puffs into the lungs daily. 11/16/16  Yes Vaughan Basta, MD  ferrous sulfate 325 (65 FE) MG tablet Take 1 tablet by mouth 3 (three) times daily. 01/21/17  Yes [provider]  furosemide (LASIX) 80 MG tablet Take 1 tablet (80 mg total) daily by mouth. 01/25/17  Yes Demetrios Loll, MD  gabapentin (NEURONTIN) 300 MG capsule TAKE 1 CAPSULE BY MOUTH  EVERY EVENING AND 1 EXTRA CAP ON DIALYSIS DAYS AS NEEDED FOR NERVE PAIN 08/16/16  Yes [provider]  ipratropium-albuterol (DUONEB) 0.5-2.5 (3) MG/3ML SOLN Take 3 mLs by nebulization every 6 (six) hours as needed. 11/28/16  Yes Wieting, Richard, MD  labetalol (NORMODYNE) 100 MG tablet Take 1 tablet (100 mg total) by mouth daily. 11/16/16  Yes Vaughan Basta, MD  lactulose (CHRONULAC) 10 GM/15ML solution Take 30 mLs (20 g total) by mouth 3 (three) times daily. 01/11/17  Yes Mody, Ulice Bold, MD  Multiple Vitamins-Minerals-FA (DIALYVITE SUPREME D) 3 MG TABS Take 1 tablet (3 mg total) by mouth daily. 11/28/16  Yes Wieting, Richard, MD  nicotine (NICODERM CQ - DOSED IN  MG/24 HOURS) 21 mg/24hr patch Place 1 patch (21 mg total) onto the skin daily. 01/12/17  Yes Mody, Ulice Bold, MD  nitroGLYCERIN (NITROSTAT) 0.4 MG SL tablet Place 1 tablet (0.4 mg total) under the tongue every 5 (five) minutes as needed. 04/12/16  Yes Wende Bushy, MD  pantoprazole (PROTONIX) 40 MG tablet Take 1 tablet (40 mg total) by mouth daily. 11/28/16  Yes Wieting, Richard, MD  sevelamer carbonate (RENVELA) 800 MG tablet Take 3 tablets (2,400 mg total) by mouth 3 (three) times daily with meals. 11/16/16  Yes Vaughan Basta, MD  Spacer/Aero Chamber Mouthpiece MISC 1 Units by Does not apply route every 4 (four) hours as needed (wheezing). 02/19/17  Yes Darel Hong, MD  tiotropium (SPIRIVA HANDIHALER) 18 MCG inhalation capsule Place 1 capsule (18 mcg total) into inhaler and inhale daily. 11/16/16 11/16/17 Yes Vaughan Basta, MD      VITAL SIGNS:  Blood pressure 109/69, pulse 79, temperature 98.8 F (37.1 C), temperature source Oral, resp. rate (!) 24, height 6\' 3"  (1.905 m), weight 165 lb (74.8 kg), SpO2 95 %.  PHYSICAL EXAMINATION:  Physical Exam  GENERAL:  56 y.o.-year-old patient lying in the bed with no acute distress.  EYES: Pupils equal, round, reactive to light and accommodation. No scleral icterus. Extraocular muscles intact.  HEENT: Head atraumatic, normocephalic. Oropharynx and nasopharynx clear.  NECK:  Supple, no jugular venous distention. No thyroid enlargement, no tenderness.  LUNGS: Normal breath sounds bilaterally, no wheezing, rales,rhonchi or crepitation. No use of accessory muscles of respiration.  CARDIOVASCULAR: S1, S2 normal. No murmurs, rubs, or gallops.  ABDOMEN: Soft, nontender, nondistended. Bowel sounds present. No organomegaly or mass.  EXTREMITIES: No pedal edema, cyanosis, or clubbing.  NEUROLOGIC: Cranial nerves II through XII are intact. Muscle strength 5/5 in all extremities. Sensation intact. Gait not checked.  PSYCHIATRIC: The patient is alert and  oriented x 3.  SKIN: No obvious rash, lesion, or ulcer.   LABORATORY PANEL:   CBC Recent Labs  Lab 03/03/17 1655  WBC 5.1  HGB 8.2*  HCT 26.6*  PLT 224   ------------------------------------------------------------------------------------------------------------------  Chemistries  Recent Labs  Lab 03/03/17 1655  NA 140  K 6.2*  CL 98*  CO2 31  GLUCOSE 129*  BUN 56*  CREATININE 8.29*  CALCIUM 8.4*  AST 70*  ALT 71*  ALKPHOS 247*  BILITOT 0.6   ------------------------------------------------------------------------------------------------------------------  Cardiac Enzymes Recent Labs  Lab 02/28/17 1731  TROPONINI 0.03*   ------------------------------------------------------------------------------------------------------------------  RADIOLOGY:  No results found.    IMPRESSION AND PLAN:   Hyperkalemia. The patient will be admitted to medical floor.   Give Kayexalate now and follow-up BMP.  ESRD on hemodialysis.  Dialysis tomorrow per nephrologist  Hypertension.  Continue home hypertension medication. COPD.  Stable.  Nebulizer as needed. Alcohol abuse. CIWA protocol.  History of liver cirrhosis with ascites status post paracentesiswith 4 l of amber colored fluid removed.Continue lactulose  All the records are reviewed and case discussed with ED provider. Management plans discussed with the patient, family and they are in agreement.  CODE STATUS: Full code  TOTAL TIME TAKING CARE OF THIS PATIENT: 48 minutes.    Demetrios Loll M.D on 03/03/2017 at 8:06 PM  Between 7am to 6pm - Pager - [phone]  After 6pm go to [email or web address] - Proofreader  [facility]  Office  [phone]  CC: Primary care physician; [name], MD   Note: This dictation was prepared with Dragon dictation along with smaller phrase technology. Any transcriptional errors that result from this process are unin

## 2017-03-03 NOTE — ED Triage Notes (Signed)
Pt missed dialysis yesterday because he was at [facility] getting a paracentesis done (removed 5 L). Pt presents w/ bilateral lower extremity edema. Pt still makes small amount of urine. Pt c/o bilateral lower leg pain.  LLE 2+, RLE 1+. Pulses palpable. Pt in no acute respiratory distress at this time, no c/o n/v/d/fever.

## 2017-03-03 NOTE — ED Notes (Signed)
Pt sleeping, respirations equal and unlabored at this time.

## 2017-03-03 NOTE — ED Provider Notes (Signed)
[facility] Emergency Department Provider Note  Time seen: 4:54 PM  I have reviewed the triage vital signs and the nursing notes.   HISTORY  Chief Complaint Needs dialysis   HPI Stanley [name] is a 56 y.o. male with a past medical history of past alcohol abuse, CHF, cirrhosis, end-stage renal disease on hemodialysis Tuesday, Thursday, Saturday, hypertension, hyperlipidemia, presents to the emergency department for dialysis.  According to the patient he signed out [place] yesterday from the hospital after being admitted because he needed to leave to go to [facility] to have a liver biopsy performed which was scheduled for yesterday.  Patient did not fact go to [facility] and have a liver biopsy performed.  Patient states his dialysis coordinator called him and told him he needs to come back to the emergency department for admission to the hospital so he can be dialyzed as the patient does not currently have an outpatient dialysis center that is willing to see him.  When I discussed with the patient where he was being seen he states that [facility] but they were refusing to perform dialysis on him because he was "talking shit" per patient.  Patient denies any chest pain, shortness of breath, abdominal pain, nausea vomiting or diarrhea.  Denies any fever.  Past Medical History:  Diagnosis Date  . Alcohol abuse   . CHF (congestive heart failure) ([place])   . Cirrhosis ([place])   . Coronary artery disease 2009  . Diabetic peripheral neuropathy ([place])   . Drug abuse ([place])   . End stage renal disease on dialysis [facility]) NEPHROLOGIST-   DR [facility]  IN [place]   HEMODIALYSIS --   TUES/  THURS/  SAT  . GERD (gastroesophageal reflux disease)   . Hyperlipidemia   . Hypertension   . PAD (peripheral artery disease) ([place])   . Renal insufficiency    Per pt, 32 oz fluid restriction per day  . S/P triple vessel bypass 06/09/2016   2009ish  . Suicidal ideation    & HOMICIDAL  IDEATION --  06-16-2013   ADMITTED TO BEHAVIOR HEALTH    Patient Active Problem List   Diagnosis Date Noted  . Weakness 02/28/2017  . Hypocalcemia 02/22/2017  . Shortness of breath 11/26/2016  . COPD (chronic obstructive pulmonary disease) ([place]) 10/30/2016  . COPD exacerbation ([place]) 10/29/2016  . Anemia   . Heme positive stool   . Ulceration of intestine   . Benign neoplasm of transverse colon   . Acute gastrointestinal hemorrhage   . Esophageal candidiasis ([place])   . Angiodysplasia of intestinal tract   . Acute respiratory failure with hypoxia ([place]) 07/03/2016  . GI bleeding 06/24/2016  . Rectal bleeding 06/14/2016  . Anemia of chronic disease 06/01/2016  . MRSA carrier 06/01/2016  . Chronic renal failure 05/23/2016  . Ischemic heart disease 05/23/2016  . Angiodysplasia of small intestine   . Melena   . Small bowel bleed not requiring more than 4 units of blood in 24 hours, ICU, or surgery   . Anemia due to chronic blood loss   . Abdominal pain 05/05/2016  . Acute posthemorrhagic anemia 04/17/2016  . Gastrointestinal bleed 04/17/2016  . History of esophagogastroduodenoscopy (EGD) 04/17/2016  . Elevated troponin 04/17/2016  . Alcohol abuse 04/17/2016  . Upper GI bleed 01/19/2016  . Blood in stool   . Angiodysplasia of stomach and duodenum with hemorrhage   . Gastritis   . Esophagitis, unspecified   . GI bleed 05/16/2015  .  Acute GI bleeding   . Symptomatic anemia 04/30/2015  . HTN (hypertension) 04/06/2015  . GERD (gastroesophageal reflux disease) 04/06/2015  . HLD (hyperlipidemia) 04/06/2015  . Dyspnea 04/06/2015  . Cirrhosis of liver with ascites ([place]) 04/06/2015  . Ascites 04/06/2015  . GIB (gastrointestinal bleeding) 03/23/2015  . Homicidal ideation 06/19/2013  . Suicidal intent 06/19/2013  . Homicidal ideations 06/19/2013  . Hyperkalemia 06/16/2013  . Mandible fracture ([place]) 06/05/2013  . Fracture, mandible ([place]) 06/02/2013  . Coronary atherosclerosis of  native coronary artery 06/02/2013  . ESRD on dialysis ([place]) 06/02/2013  . Mandible open fracture ([place]) 06/02/2013    Past Surgical History:  Procedure Laterality Date  . AGILE CAPSULE N/A 06/19/2016   Procedure: AGILE CAPSULE;  Surgeon: [name], MD;  Location: [facility] ENDOSCOPY;  Service: Endoscopy;  Laterality: N/A;  . COLONOSCOPY WITH PROPOFOL N/A 06/18/2016   Procedure: COLONOSCOPY WITH PROPOFOL;  Surgeon: [name], MD;  Location: [facility] ENDOSCOPY;  Service: Endoscopy;  Laterality: N/A;  . COLONOSCOPY WITH PROPOFOL N/A 08/12/2016   Procedure: COLONOSCOPY WITH PROPOFOL;  Surgeon: [name], MD;  Location: [facility] ENDOSCOPY;  Service: Endoscopy;  Laterality: N/A;  . CORONARY ANGIOPLASTY  ?   PT UNABLE TO TELL IF  BEFORE OR AFTER  CABG  . CORONARY ARTERY BYPASS GRAFT  2008  ([place])   3 VESSEL  . DIALYSIS FISTULA CREATION  LAST SURGERY  APPOX  2008  . ENTEROSCOPY N/A 05/10/2016   Procedure: ENTEROSCOPY;  Surgeon: Jerene Bears, MD;  Location: [place];  Service: Gastroenterology;  Laterality: N/A;  . ENTEROSCOPY N/A 08/12/2016   Procedure: ENTEROSCOPY;  Surgeon: [name], MD;  Location: [facility] ENDOSCOPY;  Service: Endoscopy;  Laterality: N/A;  . ESOPHAGOGASTRODUODENOSCOPY N/A 05/07/2015   Procedure: ESOPHAGOGASTRODUODENOSCOPY (EGD);  Surgeon: Hulen Luster, MD;  Location: [facility] ENDOSCOPY;  Service: Endoscopy;  Laterality: N/A;  . ESOPHAGOGASTRODUODENOSCOPY (EGD) WITH PROPOFOL N/A 05/17/2015   Procedure: ESOPHAGOGASTRODUODENOSCOPY (EGD) WITH PROPOFOL;  Surgeon: [name], MD;  Location: [facility] ENDOSCOPY;  Service: Endoscopy;  Laterality: N/A;  . ESOPHAGOGASTRODUODENOSCOPY (EGD) WITH PROPOFOL N/A 01/20/2016   Procedure: ESOPHAGOGASTRODUODENOSCOPY (EGD) WITH PROPOFOL;  Surgeon: [name], MD;  Location: [facility] ENDOSCOPY;  Service: Endoscopy;  Laterality: N/A;  . ESOPHAGOGASTRODUODENOSCOPY (EGD) WITH PROPOFOL N/A 04/17/2016   Procedure: ESOPHAGOGASTRODUODENOSCOPY (EGD) WITH PROPOFOL;  Surgeon: Lin Landsman, MD;  Location: [facility] ENDOSCOPY;  Service: Gastroenterology;  Laterality: N/A;  . ESOPHAGOGASTRODUODENOSCOPY (EGD) WITH PROPOFOL  05/09/2016   Procedure: ESOPHAGOGASTRODUODENOSCOPY (EGD) WITH PROPOFOL;  Surgeon: Jerene Bears, MD;  Location: [place];  Service: Endoscopy;;  . ESOPHAGOGASTRODUODENOSCOPY (EGD) WITH PROPOFOL N/A 06/16/2016   Procedure: ESOPHAGOGASTRODUODENOSCOPY (EGD) WITH PROPOFOL;  Surgeon: [name], MD;  Location: [facility] ENDOSCOPY;  Service: Endoscopy;  Laterality: N/A;  . GIVENS CAPSULE STUDY N/A 05/07/2016   Procedure: GIVENS CAPSULE STUDY;  Surgeon: Doran Stabler, MD;  Location: [place];  Service: Endoscopy;  Laterality: N/A;  . MANDIBULAR HARDWARE REMOVAL N/A 07/29/2013   Procedure: REMOVAL OF ARCH BARS;  Surgeon: Theodoro Kos, DO;  Location: [place];  Service: Plastics;  Laterality: N/A;  . ORIF MANDIBULAR FRACTURE N/A 06/05/2013   Procedure: REPAIR OF MANDIBULAR FRACTURE x 2 with maxillo-mandibular fixation ;  Surgeon: Theodoro Kos, DO;  Location: [place];  Service: Plastics;  Laterality: N/A;  . PERIPHERAL ARTERIAL STENT GRAFT Left     Prior to Admission medications   Medication Sig Start Date End Date Taking? Authorizing Provider  albuterol (PROVENTIL HFA;VENTOLIN HFA) 108 (90 Base) MCG/ACT inhaler  Inhale 2 puffs into the lungs every 6 (six) hours as needed for wheezing or shortness of breath. 02/19/17   Darel Hong, MD  alum & mag hydroxide-simeth (MAALOX/MYLANTA) 200-200-20 MG/5ML suspension Take 15 mLs every 4 (four) hours as needed by mouth for indigestion or heartburn. 01/25/17   Demetrios Loll, MD  aspirin 81 MG EC tablet Take 81 mg by mouth daily. 11/17/16   [provider]  atorvastatin (LIPITOR) 40 MG tablet Take 1 tablet (40 mg total) by mouth daily. 11/16/16   Vaughan Basta, MD  budesonide-formoterol (SYMBICORT) 160-4.5 MCG/ACT inhaler Inhale 2 puffs into the lungs daily. 11/16/16   Vaughan Basta, MD   ferrous sulfate 325 (65 FE) MG tablet Take 1 tablet by mouth 3 (three) times daily. 01/21/17   [provider]  furosemide (LASIX) 80 MG tablet Take 1 tablet (80 mg total) daily by mouth. 01/25/17   Demetrios Loll, MD  gabapentin (NEURONTIN) 300 MG capsule TAKE 1 CAPSULE BY MOUTH EVERY EVENING AND 1 EXTRA CAP ON DIALYSIS DAYS AS NEEDED FOR NERVE PAIN 08/16/16   [provider]  ipratropium-albuterol (DUONEB) 0.5-2.5 (3) MG/3ML SOLN Take 3 mLs by nebulization every 6 (six) hours as needed. 11/28/16   Loletha Grayer, MD  labetalol (NORMODYNE) 100 MG tablet Take 1 tablet (100 mg total) by mouth daily. 11/16/16   Vaughan Basta, MD  lactulose (CHRONULAC) 10 GM/15ML solution Take 30 mLs (20 g total) by mouth 3 (three) times daily. 01/11/17   Bettey Costa, MD  Multiple Vitamins-Minerals-FA (DIALYVITE SUPREME D) 3 MG TABS Take 1 tablet (3 mg total) by mouth daily. 11/28/16   Loletha Grayer, MD  nicotine (NICODERM CQ - DOSED IN MG/24 HOURS) 21 mg/24hr patch Place 1 patch (21 mg total) onto the skin daily. 01/12/17   Bettey Costa, MD  nitroGLYCERIN (NITROSTAT) 0.4 MG SL tablet Place 1 tablet (0.4 mg total) under the tongue every 5 (five) minutes as needed. 04/12/16   Wende Bushy, MD  pantoprazole (PROTONIX) 40 MG tablet Take 1 tablet (40 mg total) by mouth daily. 11/28/16   Loletha Grayer, MD  sevelamer carbonate (RENVELA) 800 MG tablet Take 3 tablets (2,400 mg total) by mouth 3 (three) times daily with meals. 11/16/16   Vaughan Basta, MD  Spacer/Aero Chamber Mouthpiece MISC 1 Units by Does not apply route every 4 (four) hours as needed (wheezing). 02/19/17   Darel Hong, MD  tiotropium (SPIRIVA HANDIHALER) 18 MCG inhalation capsule Place 1 capsule (18 mcg total) into inhaler and inhale daily. 11/16/16 11/16/17  Vaughan Basta, MD    No Known Allergies  Family History  Problem Relation Age of Onset  . Colon cancer Mother   . Cancer Father   . Cancer Sister   . Kidney  disease Brother     Social History Social History   Tobacco Use  . Smoking status: Current Every Day Smoker    Packs/day: 0.15    Years: 40.00    Pack years: 6.00    Types: Cigarettes  . Smokeless tobacco: Never Used  Substance Use Topics  . Alcohol use: No    Comment: pt reports quitting after learning about cirrhosis  . Drug use: No    Review of Systems Constitutional: Negative for fever Cardiovascular: Negative for chest pain. Respiratory: Negative for shortness of breath. Gastrointestinal: Negative for abdominal pain, vomiting and diarrhea. All other ROS negative  ____________________________________________   PHYSICAL EXAM:  VITAL SIGNS: ED Triage Vitals  Enc Vitals Group     BP 03/03/17 1634  118/71     Pulse Rate 03/03/17 1634 85     Resp 03/03/17 1634 20     Temp 03/03/17 1629 98.8 F (37.1 C)     Temp Source 03/03/17 1629 Oral     SpO2 03/03/17 1628 97 %     Weight 03/03/17 1633 165 lb (74.8 kg)     Height 03/03/17 1633 6\' 3"  (1.905 m)     Head Circumference --      Peak Flow --      Pain Score --      Pain Loc --      Pain Edu? --      Excl. in [place]? --     Constitutional: Alert and oriented. Well appearing and in no distress. Eyes: Normal exam ENT   Head: Normocephalic and atraumatic.   Mouth/Throat: Mucous membranes are moist. Cardiovascular: Normal rate, regular rhythm. No murmur Respiratory: Normal respiratory effort without tachypnea nor retractions. Breath sounds are clear Gastrointestinal: Soft, mild distention, dull percussion, nontender.  Most consistent with ascites. Musculoskeletal: Nontender with normal range of motion in all extremities.  Neurologic:  Normal speech and language. No gross focal neurologic deficits  Skin:  Skin is warm, dry and intact.  Psychiatric: Mood and affect are normal.   ____________________________________________   INITIAL IMPRESSION / ASSESSMENT AND PLAN / ED COURSE  Pertinent labs & imaging  results that were available during my care of the patient were reviewed by me and considered in my medical decision making (see chart for details).  Patient presents to the emergency department after being called by his dialysis coordinator stating he needed to go to the ER to be admitted to the hospital for dialysis.  We will check labs and continue to closely monitor.  Overall the patient appears very well, no distress.  I reviewed the patient's records including his AMA discharge from yesterday and [facility] liver biopsy performed yesterday.  Patient's labs are resulted with a potassium of 6.2.  Discuss with Dr. Juleen China, he recommends admission to the hospitalist service they will dialyze in the morning.  Patient continues to have no complaints in the emergency department.  ____________________________________________   FINAL CLINICAL IMPRESSION(S) / ED DIAGNOSES  Dialysis Hyperkalemia    Harvest Dark, MD 03/03/17 [phone]

## 2017-03-04 LAB — BASIC METABOLIC PANEL
ANION GAP: 11 (ref 5–15)
BUN: 57 mg/dL — ABNORMAL HIGH (ref 6–20)
CHLORIDE: 99 mmol/L — AB (ref 101–111)
CO2: 28 mmol/L (ref 22–32)
Calcium: 8.3 mg/dL — ABNORMAL LOW (ref 8.9–10.3)
Creatinine, Ser: 8.8 mg/dL — ABNORMAL HIGH (ref 0.61–1.24)
GFR calc Af Amer: 7 mL/min — ABNORMAL LOW (ref >60)
GFR, EST NON AFRICAN AMERICAN: 6 mL/min — AB (ref >60)
Glucose, Bld: 102 mg/dL — ABNORMAL HIGH (ref 65–99)
POTASSIUM: 6.3 mmol/L — AB (ref 3.5–5.1)
SODIUM: 138 mmol/L (ref 135–145)

## 2017-03-04 LAB — CBC
HEMATOCRIT: 27.7 % — AB (ref 40.0–52.0)
HEMOGLOBIN: 8.3 g/dL — AB (ref 13.0–18.0)
MCH: 26.4 pg (ref 26.0–34.0)
MCHC: 30.1 g/dL — ABNORMAL LOW (ref 32.0–36.0)
MCV: 87.8 fL (ref 80.0–100.0)
Platelets: 229 10*3/uL (ref 150–440)
RBC: 3.16 MIL/uL — ABNORMAL LOW (ref 4.40–5.90)
RDW: 20.4 % — ABNORMAL HIGH (ref 11.5–14.5)
WBC: 5.1 10*3/uL (ref 3.8–10.6)

## 2017-03-04 MED ORDER — SODIUM POLYSTYRENE SULFONATE 15 GM/60ML PO SUSP
15.0000 g | Freq: Once | ORAL | Status: AC
  Administered 2017-03-04: 15 g via ORAL
  Filled 2017-03-04: qty 60

## 2017-03-04 MED ORDER — INSULIN REGULAR HUMAN 100 UNIT/ML IJ SOLN
5.0000 [IU] | Freq: Once | INTRAMUSCULAR | Status: DC
  Filled 2017-03-04: qty 0.05

## 2017-03-04 MED ORDER — EPOETIN ALFA 10000 UNIT/ML IJ SOLN
10000.0000 [IU] | Freq: Once | INTRAMUSCULAR | Status: AC
  Administered 2017-03-04: 10000 [IU] via INTRAVENOUS

## 2017-03-04 NOTE — Progress Notes (Signed)
Post HD Assessment 

## 2017-03-04 NOTE — Progress Notes (Signed)
HD Tx Started  

## 2017-03-04 NOTE — Progress Notes (Signed)
[facility]  ROUNDING NOTE   Subjective:   [name] admitted to [facility] last night for Hyperkalemia  Hemodialysis for today.   Objective:  Vital signs in last 24 hours:  Temp:  [98.3 F (36.8 C)-98.8 F (37.1 C)] 98.3 F (36.8 C) (12/22 0455) Pulse Rate:  [79-88] 82 (12/22 0455) Resp:  [14-24] 16 (12/22 0455) BP: (100-127)/(58-84) 111/70 (12/22 0455) SpO2:  [94 %-98 %] 95 % (12/22 0455) Weight:  [74.8 kg (165 lb)] 74.8 kg (165 lb) (12/21 1633)  Weight change:  Filed Weights   03/03/17 1633  Weight: 74.8 kg (165 lb)    Intake/Output: No intake/output data recorded.   Intake/Output this shift:  No intake/output data recorded.  Physical Exam: General: NAD  Head: Normocephalic, atraumatic. Moist oral mucosal membranes  Eyes: Anicteric, PERRL  Neck: Supple, trachea midline  Lungs:  Clear to auscultation  Heart: Regular rate and rhythm  Abdomen:  Soft, nontender, +ascites  Extremities: no peripheral edema.  Neurologic: Nonfocal, moving all four extremities  Skin: No lesions  Access: AVF    Basic Metabolic Panel: Recent Labs  Lab 02/28/17 1731 03/01/17 0128 03/02/17 0454 03/03/17 1655 03/04/17 0337  NA 139 138 138 140 138  K 6.0* 6.5* 5.3* 6.2* 6.3*  CL 96* 97* 98* 98* 99*  CO2 29 29 31 31 28   GLUCOSE 114* 93 86 129* 102*  BUN 69* 73* 38* 56* 57*  CREATININE 10.30* 10.21* 6.44* 8.29* 8.80*  CALCIUM 7.8* 7.8* 8.2* 8.4* 8.3*    Liver Function Tests: Recent Labs  Lab 02/28/17 1731 03/01/17 0128 03/03/17 1655  AST 37 61* 70*  ALT 25 40 71*  ALKPHOS 169* 195* 247*  BILITOT 0.3 0.5 0.6  PROT 5.9* 5.9* 6.2*  ALBUMIN 2.6* 2.7* 2.7*   No results for input(s): LIPASE, AMYLASE in the last 168 hours. No results for input(s): AMMONIA in the last 168 hours.  CBC: Recent Labs  Lab 02/28/17 1731 03/01/17 0128 03/02/17 0454 03/03/17 1655  WBC 5.9 6.4 4.7 5.1  HGB 9.0* 8.7* 9.3* 8.2*  HCT 28.9* 27.0* 29.9* 26.6*  MCV 86.0 84.1  87.1 86.4  PLT 259 247 249 224    Cardiac Enzymes: Recent Labs  Lab 02/28/17 1731  TROPONINI 0.03*    BNP: Invalid input(s): POCBNP  CBG: Recent Labs  Lab 03/01/17 0737 03/01/17 1625 03/01/17 2103 03/02/17 0717 03/03/17 1633  GLUCAP 70 73 170* 107* 120*    Microbiology: Results for orders placed or performed during the hospital encounter of 02/22/17  Blood culture (routine x 2)     Status: None   Collection Time: 02/22/17  2:03 PM  Result Value Ref Range Status   Specimen Description BLOOD LFOA  Final   Special Requests   Final    BOTTLES DRAWN AEROBIC AND ANAEROBIC Blood Culture adequate volume   Culture NO GROWTH 5 DAYS  Final   Report Status 02/27/2017 FINAL  Final  Blood culture (routine x 2)     Status: None   Collection Time: 02/22/17  2:03 PM  Result Value Ref Range Status   Specimen Description BLOOD LEFT HAND  Final   Special Requests   Final    BOTTLES DRAWN AEROBIC AND ANAEROBIC Blood Culture adequate volume   Culture NO GROWTH 5 DAYS  Final   Report Status 02/27/2017 FINAL  Final  MRSA PCR Screening     Status: None   Collection Time: 02/22/17  8:47 PM  Result Value Ref Range Status  MRSA by PCR NEGATIVE NEGATIVE Final    Comment:        The GeneXpert MRSA Assay (FDA approved for NASAL specimens only), is one component of a comprehensive MRSA colonization surveillance program. It is not intended to diagnose MRSA infection nor to guide or monitor treatment for MRSA infections.     Coagulation Studies: No results for input(s): LABPROT, INR in the last 72 hours.  Urinalysis: No results for input(s): COLORURINE, LABSPEC, PHURINE, GLUCOSEU, HGBUR, BILIRUBINUR, KETONESUR, PROTEINUR, UROBILINOGEN, NITRITE, LEUKOCYTESUR in the last 72 hours.  Invalid input(s): APPERANCEUR    Imaging: No results found.   Medications:   . sodium chloride     . aspirin EC  81 mg Oral Daily  . atorvastatin  40 mg Oral Daily  . ferrous sulfate  325 mg  Oral TID  . folic acid  1 mg Oral Daily  . furosemide  80 mg Oral Daily  . heparin  5,000 Units Subcutaneous Q8H  . insulin regular  5 Units Intravenous Once  . labetalol  100 mg Oral Daily  . lactulose  20 g Oral TID  . mometasone-formoterol  2 puff Inhalation BID  . multivitamin  1 tablet Oral QHS  . nicotine  21 mg Transdermal Daily  . pantoprazole  40 mg Oral Daily  . sevelamer carbonate  2,400 mg Oral TID WC  . sodium chloride flush  3 mL Intravenous Q12H  . thiamine  100 mg Oral Daily   Or  . thiamine  100 mg Intravenous Daily  . tiotropium  18 mcg Inhalation Daily   sodium chloride, acetaminophen **OR** acetaminophen, albuterol, alum & mag hydroxide-simeth, bisacodyl, HYDROcodone-acetaminophen, LORazepam **OR** LORazepam, nitroGLYCERIN, ondansetron **OR** ondansetron (ZOFRAN) IV, senna-docusate, sodium chloride flush  Assessment/ Plan:  [name] is a 56 y.o. black male withend stage renal disease on hemodialysis secondary to Alport's syndrome, end stage liver disease with hepatic cirrhosis and ascites, hypertension, anemia of chronic kidney disease, coronary artery disease, peripheral vascular disease, hyperlipidemia, gastrointestinal AVMs  [facility] [place] AVF  1. End-stage renal disease with hyperkalemia: Hemodialysis orders prepared. 2K bath.   2. Ascites: status post large volume ultrasound guided paracentesis on 12/19 removing 4.5 liters.   3. Hypertension: blood pressure at goal.  - furosemide and labetatolol.   4. Anemia of chronic kidney disease: hemoglobin 8.2 - EPO with HD treatment.   5. Secondary Hyperparathyroidism: - continue binders: sevelamer 2400mg  with meals.      LOS: [place], Faithann Natal 12/22/20188:15 AM

## 2017-03-04 NOTE — Progress Notes (Signed)
Post HD Tx  

## 2017-03-04 NOTE — Progress Notes (Signed)
Pre HD  

## 2017-03-04 NOTE — Progress Notes (Signed)
MD order to discharge patient home. Discharge instructions and instructions for follow up were reviewed with patient. There were no prescriptions or medication changes. Patient had inquired about where he would go to receive his hemodialysis treatments. Dr. Juleen China was contacted and stated that the patient is to report to the emergency room to receive dialysis.

## 2017-03-04 NOTE — Discharge Instructions (Signed)
Renal diet, low potassium  Activity as tolerated

## 2017-03-04 NOTE — Progress Notes (Signed)
HD Tx Ended  

## 2017-03-04 NOTE — Progress Notes (Signed)
Spoke with Dr.Pyreddy via phone about critical potassium of 6.3. New orders for regular insulin 5 unit iv push and 15gms of kayexalate po. Stanley [name]

## 2017-03-04 NOTE — Progress Notes (Signed)
HD Pre Assessment

## 2017-03-08 ENCOUNTER — Emergency Department: Payer: Medicare Other

## 2017-03-08 ENCOUNTER — Encounter: Payer: Self-pay | Admitting: Emergency Medicine

## 2017-03-08 ENCOUNTER — Observation Stay
Admission: EM | Admit: 2017-03-08 | Discharge: 2017-03-09 | Discharge status: Against Medical Advice | Payer: Medicare Other | Attending: Internal Medicine | Admitting: Internal Medicine

## 2017-03-08 DIAGNOSIS — F17210 Nicotine dependence, cigarettes, uncomplicated: Secondary | ICD-10-CM | POA: Diagnosis not present

## 2017-03-08 DIAGNOSIS — E1151 Type 2 diabetes mellitus with diabetic peripheral angiopathy without gangrene: Secondary | ICD-10-CM | POA: Diagnosis not present

## 2017-03-08 DIAGNOSIS — J449 Chronic obstructive pulmonary disease, unspecified: Secondary | ICD-10-CM | POA: Diagnosis present

## 2017-03-08 DIAGNOSIS — I132 Hypertensive heart and chronic kidney disease with heart failure and with stage 5 chronic kidney disease, or end stage renal disease: Principal | ICD-10-CM | POA: Insufficient documentation

## 2017-03-08 DIAGNOSIS — I10 Essential (primary) hypertension: Secondary | ICD-10-CM | POA: Diagnosis present

## 2017-03-08 DIAGNOSIS — E1142 Type 2 diabetes mellitus with diabetic polyneuropathy: Secondary | ICD-10-CM | POA: Diagnosis not present

## 2017-03-08 DIAGNOSIS — Z9115 Patient's noncompliance with renal dialysis: Secondary | ICD-10-CM | POA: Diagnosis not present

## 2017-03-08 DIAGNOSIS — Z951 Presence of aortocoronary bypass graft: Secondary | ICD-10-CM | POA: Diagnosis not present

## 2017-03-08 DIAGNOSIS — R188 Other ascites: Secondary | ICD-10-CM | POA: Diagnosis present

## 2017-03-08 DIAGNOSIS — N186 End stage renal disease: Secondary | ICD-10-CM | POA: Diagnosis not present

## 2017-03-08 DIAGNOSIS — F1010 Alcohol abuse, uncomplicated: Secondary | ICD-10-CM | POA: Diagnosis not present

## 2017-03-08 DIAGNOSIS — I2510 Atherosclerotic heart disease of native coronary artery without angina pectoris: Secondary | ICD-10-CM | POA: Diagnosis present

## 2017-03-08 DIAGNOSIS — D631 Anemia in chronic kidney disease: Secondary | ICD-10-CM | POA: Insufficient documentation

## 2017-03-08 DIAGNOSIS — N2581 Secondary hyperparathyroidism of renal origin: Secondary | ICD-10-CM | POA: Diagnosis not present

## 2017-03-08 DIAGNOSIS — E785 Hyperlipidemia, unspecified: Secondary | ICD-10-CM | POA: Diagnosis not present

## 2017-03-08 DIAGNOSIS — N19 Unspecified kidney failure: Secondary | ICD-10-CM | POA: Diagnosis not present

## 2017-03-08 DIAGNOSIS — E1122 Type 2 diabetes mellitus with diabetic chronic kidney disease: Secondary | ICD-10-CM | POA: Insufficient documentation

## 2017-03-08 DIAGNOSIS — Z95820 Peripheral vascular angioplasty status with implants and grafts: Secondary | ICD-10-CM | POA: Diagnosis not present

## 2017-03-08 DIAGNOSIS — Z992 Dependence on renal dialysis: Secondary | ICD-10-CM | POA: Insufficient documentation

## 2017-03-08 DIAGNOSIS — Z7982 Long term (current) use of aspirin: Secondary | ICD-10-CM | POA: Insufficient documentation

## 2017-03-08 DIAGNOSIS — I700 Atherosclerosis of aorta: Secondary | ICD-10-CM | POA: Insufficient documentation

## 2017-03-08 DIAGNOSIS — Z5321 Procedure and treatment not carried out due to patient leaving prior to being seen by health care provider: Secondary | ICD-10-CM | POA: Diagnosis not present

## 2017-03-08 DIAGNOSIS — Q8781 Alport syndrome: Secondary | ICD-10-CM | POA: Insufficient documentation

## 2017-03-08 DIAGNOSIS — K219 Gastro-esophageal reflux disease without esophagitis: Secondary | ICD-10-CM | POA: Diagnosis not present

## 2017-03-08 DIAGNOSIS — Z79899 Other long term (current) drug therapy: Secondary | ICD-10-CM | POA: Diagnosis not present

## 2017-03-08 DIAGNOSIS — K7460 Unspecified cirrhosis of liver: Secondary | ICD-10-CM | POA: Diagnosis not present

## 2017-03-08 LAB — BASIC METABOLIC PANEL
Anion gap: 12 (ref 5–15)
BUN: 55 mg/dL — AB (ref 6–20)
CHLORIDE: 96 mmol/L — AB (ref 101–111)
CO2: 31 mmol/L (ref 22–32)
CREATININE: 10.72 mg/dL — AB (ref 0.61–1.24)
Calcium: 8.6 mg/dL — ABNORMAL LOW (ref 8.9–10.3)
GFR calc Af Amer: 5 mL/min — ABNORMAL LOW (ref >60)
GFR calc non Af Amer: 5 mL/min — ABNORMAL LOW (ref >60)
Glucose, Bld: 104 mg/dL — ABNORMAL HIGH (ref 65–99)
Potassium: 4.7 mmol/L (ref 3.5–5.1)
Sodium: 139 mmol/L (ref 135–145)

## 2017-03-08 LAB — CBC
HEMATOCRIT: 31.2 % — AB (ref 40.0–52.0)
HEMOGLOBIN: 9.9 g/dL — AB (ref 13.0–18.0)
MCH: 27.2 pg (ref 26.0–34.0)
MCHC: 31.6 g/dL — AB (ref 32.0–36.0)
MCV: 86 fL (ref 80.0–100.0)
Platelets: 273 10*3/uL (ref 150–440)
RBC: 3.63 MIL/uL — ABNORMAL LOW (ref 4.40–5.90)
RDW: 20.3 % — ABNORMAL HIGH (ref 11.5–14.5)
WBC: 5.4 10*3/uL (ref 3.8–10.6)

## 2017-03-08 MED ORDER — ALUM & MAG HYDROXIDE-SIMETH 200-200-20 MG/5ML PO SUSP
15.0000 mL | ORAL | Status: DC | PRN
  Administered 2017-03-09 (×2): 15 mL via ORAL
  Filled 2017-03-08 (×2): qty 30

## 2017-03-08 NOTE — H&P (Signed)
Stanley [name] at [place] NAME: Stanley [name]    MR#:  [id number]  DATE OF BIRTH:  29-Aug-1960  DATE OF ADMISSION:  03/08/2017  PRIMARY CARE PHYSICIAN: Stanley Mires Elyse Jarvis, MD   REQUESTING/REFERRING PHYSICIAN: Owens Shark, MD  CHIEF COMPLAINT:   Chief Complaint  Patient presents with  . Emesis    HISTORY OF PRESENT ILLNESS:  Stanley [name]  is a 56 y.o. male who presents from nephrology clinic for dialysis.  Patient has missed several dialysis sessions, he has had difficulty finding a dialysis center that will continue to do his dialysis.  He has gone several days without dialysis and is uremic.  Hospitalist were called for admission  PAST MEDICAL HISTORY:   Past Medical History:  Diagnosis Date  . Alcohol abuse   . CHF (congestive heart failure) ([place])   . Cirrhosis ([place])   . Coronary artery disease 2009  . Diabetic peripheral neuropathy ([place])   . Drug abuse ([place])   . End stage renal disease on dialysis [facility]) NEPHROLOGIST-   DR [facility]  IN [place]   HEMODIALYSIS --   TUES/  THURS/  SAT  . GERD (gastroesophageal reflux disease)   . Hyperlipidemia   . Hypertension   . PAD (peripheral artery disease) ([place])   . Renal insufficiency    Per pt, 32 oz fluid restriction per day  . S/P triple vessel bypass 06/09/2016   2009ish  . Suicidal ideation    & HOMICIDAL IDEATION --  06-16-2013   ADMITTED TO BEHAVIOR HEALTH    PAST SURGICAL HISTORY:   Past Surgical History:  Procedure Laterality Date  . AGILE CAPSULE N/A 06/19/2016   Procedure: AGILE CAPSULE;  Surgeon: [name], MD;  Location: [facility] ENDOSCOPY;  Service: Endoscopy;  Laterality: N/A;  . COLONOSCOPY WITH PROPOFOL N/A 06/18/2016   Procedure: COLONOSCOPY WITH PROPOFOL;  Surgeon: [name], MD;  Location: [facility] ENDOSCOPY;  Service: Endoscopy;  Laterality: N/A;  . COLONOSCOPY WITH PROPOFOL N/A 08/12/2016   Procedure: COLONOSCOPY WITH PROPOFOL;  Surgeon: [name], MD;  Location:  [facility] ENDOSCOPY;  Service: Endoscopy;  Laterality: N/A;  . CORONARY ANGIOPLASTY  ?   PT UNABLE TO TELL IF  BEFORE OR AFTER  CABG  . CORONARY ARTERY BYPASS GRAFT  2008  ([place] , [place])   3 VESSEL  . DIALYSIS FISTULA CREATION  LAST SURGERY  APPOX  2008  . ENTEROSCOPY N/A 05/10/2016   Procedure: ENTEROSCOPY;  Surgeon: Jerene Bears, MD;  Location: [place];  Service: Gastroenterology;  Laterality: N/A;  . ENTEROSCOPY N/A 08/12/2016   Procedure: ENTEROSCOPY;  Surgeon: [name], MD;  Location: [facility] ENDOSCOPY;  Service: Endoscopy;  Laterality: N/A;  . ESOPHAGOGASTRODUODENOSCOPY N/A 05/07/2015   Procedure: ESOPHAGOGASTRODUODENOSCOPY (EGD);  Surgeon: Hulen Luster, MD;  Location: [facility] ENDOSCOPY;  Service: Endoscopy;  Laterality: N/A;  . ESOPHAGOGASTRODUODENOSCOPY (EGD) WITH PROPOFOL N/A 05/17/2015   Procedure: ESOPHAGOGASTRODUODENOSCOPY (EGD) WITH PROPOFOL;  Surgeon: [name], MD;  Location: [facility] ENDOSCOPY;  Service: Endoscopy;  Laterality: N/A;  . ESOPHAGOGASTRODUODENOSCOPY (EGD) WITH PROPOFOL N/A 01/20/2016   Procedure: ESOPHAGOGASTRODUODENOSCOPY (EGD) WITH PROPOFOL;  Surgeon: [name], MD;  Location: [facility] ENDOSCOPY;  Service: Endoscopy;  Laterality: N/A;  . ESOPHAGOGASTRODUODENOSCOPY (EGD) WITH PROPOFOL N/A 04/17/2016   Procedure: ESOPHAGOGASTRODUODENOSCOPY (EGD) WITH PROPOFOL;  Surgeon: Lin Landsman, MD;  Location: [facility] ENDOSCOPY;  Service: Gastroenterology;  Laterality: N/A;  . ESOPHAGOGASTRODUODENOSCOPY (EGD) WITH PROPOFOL  05/09/2016   Procedure: ESOPHAGOGASTRODUODENOSCOPY (EGD) WITH PROPOFOL;  Surgeon: Jerene Bears, MD;  Location: [facility] ENDOSCOPY;  Service: Endoscopy;;  . ESOPHAGOGASTRODUODENOSCOPY (EGD) WITH PROPOFOL N/A 06/16/2016   Procedure: ESOPHAGOGASTRODUODENOSCOPY (EGD) WITH PROPOFOL;  Surgeon: [name], MD;  Location: [facility] ENDOSCOPY;  Service: Endoscopy;  Laterality: N/A;  . GIVENS CAPSULE STUDY N/A 05/07/2016   Procedure: GIVENS CAPSULE STUDY;  Surgeon: Doran Stabler, MD;   Location: [facility];  Service: Endoscopy;  Laterality: N/A;  . MANDIBULAR HARDWARE REMOVAL N/A 07/29/2013   Procedure: REMOVAL OF ARCH BARS;  Surgeon: Theodoro Kos, DO;  Location: [place];  Service: Plastics;  Laterality: N/A;  . ORIF MANDIBULAR FRACTURE N/A 06/05/2013   Procedure: REPAIR OF MANDIBULAR FRACTURE x 2 with maxillo-mandibular fixation ;  Surgeon: Theodoro Kos, DO;  Location: [place];  Service: Plastics;  Laterality: N/A;  . PERIPHERAL ARTERIAL STENT GRAFT Left     SOCIAL HISTORY:   Social History   Tobacco Use  . Smoking status: Current Every Day Smoker    Packs/day: 0.15    Years: 40.00    Pack years: 6.00    Types: Cigarettes  . Smokeless tobacco: Never Used  Substance Use Topics  . Alcohol use: No    Comment: pt reports quitting after learning about cirrhosis    FAMILY HISTORY:   Family History  Problem Relation Age of Onset  . Colon cancer Mother   . Cancer Father   . Cancer Sister   . Kidney disease Brother     DRUG ALLERGIES:  No Known Allergies  MEDICATIONS AT HOME:   Prior to Admission medications   Medication Sig Start Date End Date Taking? Authorizing Provider  albuterol (PROVENTIL HFA;VENTOLIN HFA) 108 (90 Base) MCG/ACT inhaler Inhale 2 puffs into the lungs every 6 (six) hours as needed for wheezing or shortness of breath. 02/19/17  Yes Darel Hong, MD  alum & mag hydroxide-simeth (MAALOX/MYLANTA) 200-200-20 MG/5ML suspension Take 15 mLs every 4 (four) hours as needed by mouth for indigestion or heartburn. 01/25/17  Yes Demetrios Loll, MD  aspirin 81 MG EC tablet Take 81 mg by mouth daily. 11/17/16  Yes [provider]  atorvastatin (LIPITOR) 40 MG tablet Take 1 tablet (40 mg total) by mouth daily. 11/16/16  Yes Vaughan Basta, MD  budesonide-formoterol [facility]) 160-4.5 MCG/ACT inhaler Inhale 2 puffs into the lungs daily. 11/16/16  Yes Vaughan Basta, MD  ferrous sulfate 325 (65 FE) MG tablet Take 1 tablet  by mouth 3 (three) times daily. 01/21/17  Yes [provider]  furosemide (LASIX) 80 MG tablet Take 1 tablet (80 mg total) daily by mouth. 01/25/17  Yes Demetrios Loll, MD  gabapentin (NEURONTIN) 300 MG capsule TAKE 1 CAPSULE BY MOUTH EVERY EVENING AND 1 EXTRA CAP ON DIALYSIS DAYS AS NEEDED FOR NERVE PAIN 08/16/16  Yes [provider]  ipratropium-albuterol (DUONEB) 0.5-2.5 (3) MG/3ML SOLN Take 3 mLs by nebulization every 6 (six) hours as needed. 11/28/16  Yes Wieting, Richard, MD  labetalol (NORMODYNE) 100 MG tablet Take 1 tablet (100 mg total) by mouth daily. 11/16/16  Yes Vaughan Basta, MD  lactulose (CHRONULAC) 10 GM/15ML solution Take 30 mLs (20 g total) by mouth 3 (three) times daily. 01/11/17  Yes Mody, Ulice Bold, MD  Multiple Vitamins-Minerals-FA (DIALYVITE SUPREME D) 3 MG TABS Take 1 tablet (3 mg total) by mouth daily. 11/28/16  Yes Wieting, Richard, MD  nicotine (NICODERM CQ - DOSED IN MG/24 HOURS) 21 mg/24hr patch Place 1 patch (21 mg total) onto the skin daily. 01/12/17  Yes Bettey Costa, MD  nitroGLYCERIN (NITROSTAT) 0.4 MG  SL tablet Place 1 tablet (0.4 mg total) under the tongue every 5 (five) minutes as needed. 04/12/16  Yes Wende Bushy, MD  pantoprazole (PROTONIX) 40 MG tablet Take 1 tablet (40 mg total) by mouth daily. 11/28/16  Yes Wieting, Richard, MD  sevelamer carbonate (RENVELA) 800 MG tablet Take 3 tablets (2,400 mg total) by mouth 3 (three) times daily with meals. 11/16/16  Yes Vaughan Basta, MD  Spacer/Aero Chamber Mouthpiece MISC 1 Units by Does not apply route every 4 (four) hours as needed (wheezing). 02/19/17  Yes Darel Hong, MD  tiotropium (SPIRIVA HANDIHALER) 18 MCG inhalation capsule Place 1 capsule (18 mcg total) into inhaler and inhale daily. 11/16/16 11/16/17 Yes Vaughan Basta, MD    REVIEW OF SYSTEMS:  Review of Systems  Constitutional: Negative for chills, fever, malaise/fatigue and weight loss.  HENT: Negative for ear pain, hearing  loss and tinnitus.   Eyes: Negative for blurred vision, double vision, pain and redness.  Respiratory: Negative for cough, hemoptysis and shortness of breath.   Cardiovascular: Negative for chest pain, palpitations, orthopnea and leg swelling.  Gastrointestinal: Negative for abdominal pain, constipation, diarrhea, nausea and vomiting.  Genitourinary: Negative for dysuria, frequency and hematuria.  Musculoskeletal: Negative for back pain, joint pain and neck pain.  Skin:       No acne, rash, or lesions  Neurological: Negative for dizziness, tremors, focal weakness and weakness.  Endo/Heme/Allergies: Negative for polydipsia. Does not bruise/bleed easily.  Psychiatric/Behavioral: Negative for depression. The patient is not nervous/anxious and does not have insomnia.      VITAL SIGNS:   Vitals:   03/08/17 2030 03/08/17 2100 03/08/17 2130 03/08/17 2200  BP: (!) 153/89 (!) 141/95 (!) 139/91 (!) 142/93  Pulse:  86 90 87  Resp: 17 14 12 11   Temp:      SpO2:  96% 96% 96%  Weight:      Height:       Wt Readings from Last 3 Encounters:  03/08/17 74.8 kg (165 lb)  03/04/17 74.8 kg (164 lb 14.5 oz)  03/02/17 74.8 kg (164 lb 14.4 oz)    PHYSICAL EXAMINATION:  Physical Exam  Vitals reviewed. Constitutional: He is oriented to person, place, and time. He appears well-developed and well-nourished. No distress.  HENT:  Head: Normocephalic and atraumatic.  Mouth/Throat: Oropharynx is clear and moist.  Eyes: Conjunctivae and EOM are normal. Pupils are equal, round, and reactive to light. No scleral icterus.  Neck: Normal range of motion. Neck supple. No JVD present. No thyromegaly present.  Cardiovascular: Normal rate, regular rhythm and intact distal pulses. Exam reveals no gallop and no friction rub.  No murmur heard. Respiratory: Effort normal and breath sounds normal. No respiratory distress. He has no wheezes. He has no rales.  GI: Soft. Bowel sounds are normal. He exhibits no distension.  There is no tenderness.  Musculoskeletal: Normal range of motion. He exhibits no edema.  No arthritis, no gout  Lymphadenopathy:    He has no cervical adenopathy.  Neurological: He is alert and oriented to person, place, and time. No cranial nerve deficit.  No dysarthria, no aphasia  Skin: Skin is warm and dry. No rash noted. No erythema.  Psychiatric: He has a normal mood and affect. His behavior is normal. Judgment and thought content normal.    LABORATORY PANEL:   CBC Recent Labs  Lab 03/08/17 1638  WBC 5.4  HGB 9.9*  HCT 31.2*  PLT 273   ------------------------------------------------------------------------------------------------------------------  Chemistries  Recent Labs  Lab  03/03/17 1655  03/08/17 1638  NA 140   < > 139  K 6.2*   < > 4.7  CL 98*   < > 96*  CO2 31   < > 31  GLUCOSE 129*   < > 104*  BUN 56*   < > 55*  CREATININE 8.29*   < > 10.72*  CALCIUM 8.4*   < > 8.6*  AST 70*  --   --   ALT 71*  --   --   ALKPHOS 247*  --   --   BILITOT 0.6  --   --    < > = values in this interval not displayed.   ------------------------------------------------------------------------------------------------------------------  Cardiac Enzymes No results for input(s): TROPONINI in the last 168 hours. ------------------------------------------------------------------------------------------------------------------  RADIOLOGY:  Dg Chest 2 View  Result Date: 03/08/2017 CLINICAL DATA:  Dyspnea.  Dialysis patient. EXAM: CHEST  2 VIEW COMPARISON:  Chest x-ray dated 02/28/2017. FINDINGS: Cardiomegaly is stable. Atherosclerotic changes noted at the aortic arch. Median sternotomy wires appear intact and stable in alignment. Lungs are hyperexpanded. Coarse lung markings bilaterally are compatible with chronic interstitial lung disease and/or chronic bronchitic change. No confluent opacity to suggest a developing pneumonia. No pleural effusion or pneumothorax seen. No evidence  of pulmonary edema. IMPRESSION: 1. No active cardiopulmonary disease. No evidence of pneumonia or pulmonary edema. 2. COPD. Associated chronic interstitial lung disease and/or chronic bronchitic changes. 3. Stable cardiomegaly.  No evidence of active CHF. 4. Aortic atherosclerosis. Electronically Signed   By: Franki Cabot M.D.   On: 03/08/2017 20:30    EKG:   Orders placed or performed during the hospital encounter of 02/28/17  . EKG 12-Lead  . EKG 12-Lead    IMPRESSION AND PLAN:  Principal Problem:   Uremia -due to missing dialysis, admit for observation and consult nephrology for dialysis Active Problems:   ESRD on dialysis [facility]) -nephrology consult and dialysis as above   Coronary atherosclerosis of native coronary artery -continue home meds   HTN (hypertension) -continue home medications   COPD (chronic obstructive pulmonary disease) ([place]) -home dose inhalers  All the records are reviewed and case discussed with ED provider. Management plans discussed with the patient and/or family.  DVT PROPHYLAXIS: SubQ heparin  GI PROPHYLAXIS: PPI  ADMISSION STATUS: Observation  CODE STATUS: Full Code Status History    Date Active Date Inactive Code Status Order ID Comments User Context   03/03/2017 20:27 03/04/2017 19:20 Full Code [id number]  Demetrios Loll, MD Inpatient   02/28/2017 22:54 03/02/2017 15:19 Full Code [id number]  Lance Coon, MD Inpatient   02/22/2017 17:21 02/24/2017 13:59 Full Code [id number]  Dustin Flock, MD Inpatient   01/22/2017 23:37 01/25/2017 15:22 Full Code [id number]  Gorden Harms, MD Inpatient   01/07/2017 14:45 01/13/2017 18:12 Full Code [id number]  Idelle Crouch, MD Inpatient   12/22/2016 14:31 12/24/2016 18:04 Full Code [id number]  Hillary Bow, MD ED   12/20/2016 20:20 12/21/2016 18:53 Full Code [id number]  Demetrios Loll, MD Inpatient   11/26/2016 10:30 11/28/2016 15:22 Full Code [id number]  Henreitta Leber, MD Inpatient   11/15/2016 19:30 11/16/2016 18:22 Full  Code [id number]  Fritzi Mandes, MD Inpatient   10/29/2016 14:53 10/30/2016 15:06 Full Code [id number]  Loletha Grayer, MD ED   09/15/2016 17:57 09/16/2016 18:17 Full Code [id number]  Dustin Flock, MD ED   08/16/2016 09:45 08/18/2016 18:16 Full Code [id number]  Hillary Bow, MD ED   08/09/2016 16:59 08/12/2016 20:34 Full Code [id number]  Fritzi Mandes, MD  Inpatient   07/03/2016 21:54 07/05/2016 21:22 Full Code [id number]  Demetrios Loll, MD Inpatient   06/24/2016 04:23 06/26/2016 15:46 Full Code [id number]  Saundra Shelling, MD Inpatient   06/14/2016 14:53 06/19/2016 21:04 Full Code [id number]  Hillary Bow, MD ED   05/31/2016 06:42 06/01/2016 21:30 Full Code [id number]  Saundra Shelling, MD Inpatient   05/05/2016 23:09 05/10/2016 21:57 Full Code [id number]  Vianne Bulls, MD Inpatient   04/16/2016 03:56 04/17/2016 18:27 Full Code [id number]  Saundra Shelling, MD Inpatient   01/19/2016 15:13 01/22/2016 14:14 Full Code [id number]  Loletha Grayer, MD ED   09/24/2015 10:33 09/25/2015 13:31 Full Code [id number]  Loletha Grayer, MD ED   05/16/2015 14:17 05/17/2015 20:57 Full Code [id number]  Idelle Crouch, MD Inpatient   04/30/2015 20:08 05/08/2015 19:40 Full Code [id number]  Vaughan Basta, MD Inpatient   04/06/2015 05:07 04/07/2015 17:36 Full Code [id number]  Lance Coon, MD Inpatient   03/23/2015 11:07 03/27/2015 12:46 Full Code [id number]  Bettey Costa, MD Inpatient   06/19/2013 11:32 06/21/2013 17:49 Full Code [id number]  Cristal Ford, DO Inpatient   06/16/2013 17:26 06/19/2013 11:32 Full Code [id number]  Margarita Mail, PA-C ED   06/05/2013 15:33 06/06/2013 18:56 Full Code [id number]  Theodoro Kos, DO Inpatient   06/02/2013 09:01 06/05/2013 15:33 Full Code [id number]  Velvet Bathe, MD Inpatient   06/02/2013 08:40 06/02/2013 09:01 Full Code [id number]  Velvet Bathe, MD Inpatient      TOTAL TIME TAKING CARE OF THIS PATIENT: 40 minutes.   Daviona Herbert [place] 03/08/2017, 10:23 PM  [facility]  Office   [phone]  CC: Primary care physician; [name], MD  Note:  This document was prepared using Dragon voice recognition software and may include unintentional dictation errors.

## 2017-03-08 NOTE — ED Notes (Signed)
Pt requesting medication for reflux. This RN will review signed and held or page admitting MD if need

## 2017-03-08 NOTE — ED Notes (Addendum)
Pt requesting dialysis due to "I been kicked out all those other places because those motherfuckers kill people and I tell those motherfuckers that and they wont let me come back so I gotta come here until my doctor finds me a dialysis place where motherfuckers know what they doing"  Pt in NAD

## 2017-03-08 NOTE — ED Provider Notes (Signed)
[facility] Emergency Department Provider Note   First MD Initiated Contact with Patient 03/08/17 2010     (approximate)  I have reviewed the triage vital signs and the nursing notes.   HISTORY  Chief Complaint Emesis   HPI Stanley [name] is a 56 y.o. male with below list of chronic medical conditions including end-stage renal disease receiving dialysis Tuesday Thursday and Saturday presents to the emergency department with "I need dialysis".  Patient states that last dialysis received was last week Thursday.  Patient admits to dyspnea however denies any pain.  Patient states that he had a verbal altercation with the dialysis center and cannot return there.  Patient's nephrologist is Dr. Juleen China   Past Medical History:  Diagnosis Date  . Alcohol abuse   . CHF (congestive heart failure) ([place])   . Cirrhosis ([place])   . Coronary artery disease 2009  . Diabetic peripheral neuropathy ([place])   . Drug abuse ([place])   . End stage renal disease on dialysis [facility]) NEPHROLOGIST-   DR [facility]  IN [place]   HEMODIALYSIS --   TUES/  THURS/  SAT  . GERD (gastroesophageal reflux disease)   . Hyperlipidemia   . Hypertension   . PAD (peripheral artery disease) ([place])   . Renal insufficiency    Per pt, 32 oz fluid restriction per day  . S/P triple vessel bypass 06/09/2016   2009ish  . Suicidal ideation    & HOMICIDAL IDEATION --  06-16-2013   ADMITTED TO BEHAVIOR HEALTH    Patient Active Problem List   Diagnosis Date Noted  . Hemodialysis patient ([place]) 03/03/2017  . Weakness 02/28/2017  . Hypocalcemia 02/22/2017  . Shortness of breath 11/26/2016  . COPD (chronic obstructive pulmonary disease) ([place]) 10/30/2016  . COPD exacerbation ([place]) 10/29/2016  . Anemia   . Heme positive stool   . Ulceration of intestine   . Benign neoplasm of transverse colon   . Acute gastrointestinal hemorrhage   . Esophageal candidiasis ([place])   . Angiodysplasia of intestinal tract     . Acute respiratory failure with hypoxia ([place]) 07/03/2016  . GI bleeding 06/24/2016  . Rectal bleeding 06/14/2016  . Anemia of chronic disease 06/01/2016  . MRSA carrier 06/01/2016  . Chronic renal failure 05/23/2016  . Ischemic heart disease 05/23/2016  . Angiodysplasia of small intestine   . Melena   . Small bowel bleed not requiring more than 4 units of blood in 24 hours, ICU, or surgery   . Anemia due to chronic blood loss   . Abdominal pain 05/05/2016  . Acute posthemorrhagic anemia 04/17/2016  . Gastrointestinal bleed 04/17/2016  . History of esophagogastroduodenoscopy (EGD) 04/17/2016  . Elevated troponin 04/17/2016  . Alcohol abuse 04/17/2016  . Upper GI bleed 01/19/2016  . Blood in stool   . Angiodysplasia of stomach and duodenum with hemorrhage   . Gastritis   . Esophagitis, unspecified   . GI bleed 05/16/2015  . Acute GI bleeding   . Symptomatic anemia 04/30/2015  . HTN (hypertension) 04/06/2015  . GERD (gastroesophageal reflux disease) 04/06/2015  . HLD (hyperlipidemia) 04/06/2015  . Dyspnea 04/06/2015  . Cirrhosis of liver with ascites ([place]) 04/06/2015  . Ascites 04/06/2015  . GIB (gastrointestinal bleeding) 03/23/2015  . Homicidal ideation 06/19/2013  . Suicidal intent 06/19/2013  . Homicidal ideations 06/19/2013  . Hyperkalemia 06/16/2013  . Mandible fracture ([place]) 06/05/2013  . Fracture, mandible ([place]) 06/02/2013  . Coronary atherosclerosis of native coronary artery 06/02/2013  .  ESRD on dialysis ([place]) 06/02/2013  . Mandible open fracture ([place]) 06/02/2013    Past Surgical History:  Procedure Laterality Date  . AGILE CAPSULE N/A 06/19/2016   Procedure: AGILE CAPSULE;  Surgeon: [name], MD;  Location: [facility] ENDOSCOPY;  Service: Endoscopy;  Laterality: N/A;  . COLONOSCOPY WITH PROPOFOL N/A 06/18/2016   Procedure: COLONOSCOPY WITH PROPOFOL;  Surgeon: [name], MD;  Location: [facility] ENDOSCOPY;  Service: Endoscopy;  Laterality: N/A;  . COLONOSCOPY WITH  PROPOFOL N/A 08/12/2016   Procedure: COLONOSCOPY WITH PROPOFOL;  Surgeon: [name], MD;  Location: [facility] ENDOSCOPY;  Service: Endoscopy;  Laterality: N/A;  . CORONARY ANGIOPLASTY  ?   PT UNABLE TO TELL IF  BEFORE OR AFTER  CABG  . CORONARY ARTERY BYPASS GRAFT  2008  ([place] , [place])   3 VESSEL  . DIALYSIS FISTULA CREATION  LAST SURGERY  APPOX  2008  . ENTEROSCOPY N/A 05/10/2016   Procedure: ENTEROSCOPY;  Surgeon: Jerene Bears, MD;  Location: [place];  Service: Gastroenterology;  Laterality: N/A;  . ENTEROSCOPY N/A 08/12/2016   Procedure: ENTEROSCOPY;  Surgeon: [name], MD;  Location: [facility] ENDOSCOPY;  Service: Endoscopy;  Laterality: N/A;  . ESOPHAGOGASTRODUODENOSCOPY N/A 05/07/2015   Procedure: ESOPHAGOGASTRODUODENOSCOPY (EGD);  Surgeon: Hulen Luster, MD;  Location: [facility] ENDOSCOPY;  Service: Endoscopy;  Laterality: N/A;  . ESOPHAGOGASTRODUODENOSCOPY (EGD) WITH PROPOFOL N/A 05/17/2015   Procedure: ESOPHAGOGASTRODUODENOSCOPY (EGD) WITH PROPOFOL;  Surgeon: [name], MD;  Location: [facility] ENDOSCOPY;  Service: Endoscopy;  Laterality: N/A;  . ESOPHAGOGASTRODUODENOSCOPY (EGD) WITH PROPOFOL N/A 01/20/2016   Procedure: ESOPHAGOGASTRODUODENOSCOPY (EGD) WITH PROPOFOL;  Surgeon: [name], MD;  Location: [facility] ENDOSCOPY;  Service: Endoscopy;  Laterality: N/A;  . ESOPHAGOGASTRODUODENOSCOPY (EGD) WITH PROPOFOL N/A 04/17/2016   Procedure: ESOPHAGOGASTRODUODENOSCOPY (EGD) WITH PROPOFOL;  Surgeon: Lin Landsman, MD;  Location: [facility] ENDOSCOPY;  Service: Gastroenterology;  Laterality: N/A;  . ESOPHAGOGASTRODUODENOSCOPY (EGD) WITH PROPOFOL  05/09/2016   Procedure: ESOPHAGOGASTRODUODENOSCOPY (EGD) WITH PROPOFOL;  Surgeon: Jerene Bears, MD;  Location: [place];  Service: Endoscopy;;  . ESOPHAGOGASTRODUODENOSCOPY (EGD) WITH PROPOFOL N/A 06/16/2016   Procedure: ESOPHAGOGASTRODUODENOSCOPY (EGD) WITH PROPOFOL;  Surgeon: [name], MD;  Location: [facility] ENDOSCOPY;  Service: Endoscopy;  Laterality: N/A;  . GIVENS  CAPSULE STUDY N/A 05/07/2016   Procedure: GIVENS CAPSULE STUDY;  Surgeon: Doran Stabler, MD;  Location: [place];  Service: Endoscopy;  Laterality: N/A;  . MANDIBULAR HARDWARE REMOVAL N/A 07/29/2013   Procedure: REMOVAL OF ARCH BARS;  Surgeon: Theodoro Kos, DO;  Location: [place];  Service: Plastics;  Laterality: N/A;  . ORIF MANDIBULAR FRACTURE N/A 06/05/2013   Procedure: REPAIR OF MANDIBULAR FRACTURE x 2 with maxillo-mandibular fixation ;  Surgeon: Theodoro Kos, DO;  Location: [place];  Service: Plastics;  Laterality: N/A;  . PERIPHERAL ARTERIAL STENT GRAFT Left     Prior to Admission medications   Medication Sig Start Date End Date Taking? Authorizing Provider  albuterol (PROVENTIL HFA;VENTOLIN HFA) 108 (90 Base) MCG/ACT inhaler Inhale 2 puffs into the lungs every 6 (six) hours as needed for wheezing or shortness of breath. 02/19/17   Darel Hong, MD  alum & mag hydroxide-simeth (MAALOX/MYLANTA) 200-200-20 MG/5ML suspension Take 15 mLs every 4 (four) hours as needed by mouth for indigestion or heartburn. 01/25/17   Demetrios Loll, MD  aspirin 81 MG EC tablet Take 81 mg by mouth daily. 11/17/16   [provider]  atorvastatin (LIPITOR) 40 MG tablet Take 1 tablet (40 mg total) by mouth daily. 11/16/16   Anselm Jungling,  Rosalio Macadamia, MD  budesonide-formoterol (SYMBICORT) 160-4.5 MCG/ACT inhaler Inhale 2 puffs into the lungs daily. 11/16/16   Vaughan Basta, MD  ferrous sulfate 325 (65 FE) MG tablet Take 1 tablet by mouth 3 (three) times daily. 01/21/17   [provider]  furosemide (LASIX) 80 MG tablet Take 1 tablet (80 mg total) daily by mouth. 01/25/17   Demetrios Loll, MD  gabapentin (NEURONTIN) 300 MG capsule TAKE 1 CAPSULE BY MOUTH EVERY EVENING AND 1 EXTRA CAP ON DIALYSIS DAYS AS NEEDED FOR NERVE PAIN 08/16/16   [provider]  ipratropium-albuterol (DUONEB) 0.5-2.5 (3) MG/3ML SOLN Take 3 mLs by nebulization every 6 (six) hours as needed. 11/28/16    Loletha Grayer, MD  labetalol (NORMODYNE) 100 MG tablet Take 1 tablet (100 mg total) by mouth daily. 11/16/16   Vaughan Basta, MD  lactulose (CHRONULAC) 10 GM/15ML solution Take 30 mLs (20 g total) by mouth 3 (three) times daily. 01/11/17   Bettey Costa, MD  Multiple Vitamins-Minerals-FA (DIALYVITE SUPREME D) 3 MG TABS Take 1 tablet (3 mg total) by mouth daily. 11/28/16   Loletha Grayer, MD  nicotine (NICODERM CQ - DOSED IN MG/24 HOURS) 21 mg/24hr patch Place 1 patch (21 mg total) onto the skin daily. 01/12/17   Bettey Costa, MD  nitroGLYCERIN (NITROSTAT) 0.4 MG SL tablet Place 1 tablet (0.4 mg total) under the tongue every 5 (five) minutes as needed. 04/12/16   Wende Bushy, MD  pantoprazole (PROTONIX) 40 MG tablet Take 1 tablet (40 mg total) by mouth daily. 11/28/16   Loletha Grayer, MD  sevelamer carbonate (RENVELA) 800 MG tablet Take 3 tablets (2,400 mg total) by mouth 3 (three) times daily with meals. 11/16/16   Vaughan Basta, MD  Spacer/Aero Chamber Mouthpiece MISC 1 Units by Does not apply route every 4 (four) hours as needed (wheezing). 02/19/17   Darel Hong, MD  tiotropium (SPIRIVA HANDIHALER) 18 MCG inhalation capsule Place 1 capsule (18 mcg total) into inhaler and inhale daily. 11/16/16 11/16/17  Vaughan Basta, MD    Allergies No known drug allergies  Family History  Problem Relation Age of Onset  . Colon cancer Mother   . Cancer Father   . Cancer Sister   . Kidney disease Brother     Social History Social History   Tobacco Use  . Smoking status: Current Every Day Smoker    Packs/day: 0.15    Years: 40.00    Pack years: 6.00    Types: Cigarettes  . Smokeless tobacco: Never Used  Substance Use Topics  . Alcohol use: No    Comment: pt reports quitting after learning about cirrhosis  . Drug use: No    Review of Systems Constitutional: No fever/chills Eyes: No visual changes. ENT: No sore throat. Cardiovascular: Denies chest  pain. Respiratory: Denies shortness of breath. Gastrointestinal: No abdominal pain.  No nausea, no vomiting.  No diarrhea.  No constipation. Genitourinary: Negative for dysuria. Musculoskeletal: Negative for neck pain.  Negative for back pain. Integumentary: Negative for rash. Neurological: Negative for headaches, focal weakness or numbness.    ____________________________________________   PHYSICAL EXAM:  VITAL SIGNS: ED Triage Vitals [03/08/17 1638]  Enc Vitals Group     BP (!) 142/86     Pulse Rate 87     Resp 20     Temp 98.3 F (36.8 C)     Temp src      SpO2 99 %     Weight 74.8 kg (165 lb)     Height 1.905  m (6\' 3" )     Head Circumference      Peak Flow      Pain Score      Pain Loc      Pain Edu?      Excl. in [place]?     Constitutional: Alert and oriented. Well appearing and in no acute distress. Eyes: Conjunctivae are normal.  Head: Atraumatic. Ears:  Healthy appearing ear canals and TMs bilaterally Nose: No congestion/rhinnorhea. Mouth/Throat: Mucous membranes are moist.  Oropharynx non-erythematous. Neck: No stridor.   Cardiovascular: Normal rate, regular rhythm. Good peripheral circulation. Grossly normal heart sounds. Respiratory: Normal respiratory effort.  No retractions. Lungs CTAB. Gastrointestinal: Soft and nontender. No distention.  Musculoskeletal: No lower extremity tenderness, 1+ bilateral pitting edema.. No gross deformities of extremities. Neurologic:  Normal speech and language. No gross focal neurologic deficits are appreciated.  Skin:  Skin is warm, dry and intact. No rash noted. Psychiatric: Mood and affect are normal. Speech and behavior are normal.  ____________________________________________   LABS (all labs ordered are listed, but only abnormal results are displayed)  Labs Reviewed  CBC - Abnormal; Notable for the following components:      Result Value   RBC 3.63 (*)    Hemoglobin 9.9 (*)    HCT 31.2 (*)    MCHC 31.6 (*)     RDW 20.3 (*)    All other components within normal limits  BASIC METABOLIC PANEL - Abnormal; Notable for the following components:   Chloride 96 (*)    Glucose, Bld 104 (*)    BUN 55 (*)    Creatinine, Ser 10.72 (*)    Calcium 8.6 (*)    GFR calc non Af Amer 5 (*)    GFR calc Af Amer 5 (*)    All other components within normal limits    RADIOLOGY I, Industry N BROWN, personally viewed and evaluated these images (plain radiographs) as part of my medical decision making, as well as reviewing the written report by the radiologist.  Dg Chest 2 View  Result Date: 03/08/2017 CLINICAL DATA:  Dyspnea.  Dialysis patient. EXAM: CHEST  2 VIEW COMPARISON:  Chest x-ray dated 02/28/2017. FINDINGS: Cardiomegaly is stable. Atherosclerotic changes noted at the aortic arch. Median sternotomy wires appear intact and stable in alignment. Lungs are hyperexpanded. Coarse lung markings bilaterally are compatible with chronic interstitial lung disease and/or chronic bronchitic change. No confluent opacity to suggest a developing pneumonia. No pleural effusion or pneumothorax seen. No evidence of pulmonary edema. IMPRESSION: 1. No active cardiopulmonary disease. No evidence of pneumonia or pulmonary edema. 2. COPD. Associated chronic interstitial lung disease and/or chronic bronchitic changes. 3. Stable cardiomegaly.  No evidence of active CHF. 4. Aortic atherosclerosis. Electronically Signed   By: Franki Cabot M.D.   On: 03/08/2017 20:30     Procedures   ____________________________________________   INITIAL IMPRESSION / ASSESSMENT AND PLAN / ED COURSE  As part of my medical decision making, I reviewed the following data within the electronic MEDICAL RECORD [id number] year old male with history of end-stage renal disease who has missed multiple ointments for dialysis presents to the emergency department referred by Dr. Ernestine Mcmurray for dialysis.  I spoke with Dr. [name] on call for Dr. Juleen China who  quested that the patient  be admitted for tomorrow morning. ____________________________________________  FINAL CLINICAL IMPRESSION(S) / ED DIAGNOSES  Final diagnoses:  Uremia     MEDICATIONS GIVEN DURING THIS VISIT:  Medications - No data to display   ED Discharge  Orders    None       Note:  This document was prepared using Dragon voice recognition software and may include unintentional dictation errors.    Gregor Hams, MD 03/08/17 2125

## 2017-03-08 NOTE — ED Notes (Signed)
This RN attempt to call report. 1A charge nurse states was unaware of getting patient and will look over pt chart and call this RN back

## 2017-03-08 NOTE — ED Notes (Signed)
Pt given sandwich tray and diet ginger ale.  

## 2017-03-08 NOTE — ED Notes (Signed)
Pt grumbling to this RN regarding wait time for bed. Again apologized and explained wait to pt.   Pt given diet gingerale

## 2017-03-08 NOTE — ED Notes (Signed)
Admitting MD at bedside.

## 2017-03-08 NOTE — ED Notes (Signed)
Pt cussing regarding not being moved inpatient at this time. Delayed explained and apologized to patient

## 2017-03-08 NOTE — ED Triage Notes (Signed)
Patient presents to ED via POV from home requesting dialysis. Last treatment "it was the last time I was in here", 12/21. Patient is scheduled to get dialysis Tuesday, Thursday and Saturday. Patient states he has missed his treatment because of transportation issues. Patient also reports emesis. Denies abdominal pain.

## 2017-03-09 ENCOUNTER — Other Ambulatory Visit: Payer: Self-pay

## 2017-03-09 DIAGNOSIS — N19 Unspecified kidney failure: Secondary | ICD-10-CM | POA: Diagnosis not present

## 2017-03-09 DIAGNOSIS — J449 Chronic obstructive pulmonary disease, unspecified: Secondary | ICD-10-CM | POA: Diagnosis not present

## 2017-03-09 DIAGNOSIS — I10 Essential (primary) hypertension: Secondary | ICD-10-CM | POA: Diagnosis not present

## 2017-03-09 DIAGNOSIS — N186 End stage renal disease: Secondary | ICD-10-CM | POA: Diagnosis not present

## 2017-03-09 DIAGNOSIS — I132 Hypertensive heart and chronic kidney disease with heart failure and with stage 5 chronic kidney disease, or end stage renal disease: Secondary | ICD-10-CM | POA: Diagnosis not present

## 2017-03-09 DIAGNOSIS — D631 Anemia in chronic kidney disease: Secondary | ICD-10-CM | POA: Diagnosis not present

## 2017-03-09 LAB — BASIC METABOLIC PANEL
Anion gap: 13 (ref 5–15)
BUN: 57 mg/dL — ABNORMAL HIGH (ref 6–20)
CALCIUM: 7.9 mg/dL — AB (ref 8.9–10.3)
CHLORIDE: 96 mmol/L — AB (ref 101–111)
CO2: 26 mmol/L (ref 22–32)
CREATININE: 10.46 mg/dL — AB (ref 0.61–1.24)
GFR, EST AFRICAN AMERICAN: 6 mL/min — AB (ref >60)
GFR, EST NON AFRICAN AMERICAN: 5 mL/min — AB (ref >60)
Glucose, Bld: 95 mg/dL (ref 65–99)
Potassium: 5.4 mmol/L — ABNORMAL HIGH (ref 3.5–5.1)
SODIUM: 135 mmol/L (ref 135–145)

## 2017-03-09 LAB — CBC
HCT: 29.5 % — ABNORMAL LOW (ref 40.0–52.0)
Hemoglobin: 9.3 g/dL — ABNORMAL LOW (ref 13.0–18.0)
MCH: 27.2 pg (ref 26.0–34.0)
MCHC: 31.7 g/dL — ABNORMAL LOW (ref 32.0–36.0)
MCV: 85.6 fL (ref 80.0–100.0)
PLATELETS: 309 10*3/uL (ref 150–440)
RBC: 3.44 MIL/uL — AB (ref 4.40–5.90)
RDW: 20.1 % — ABNORMAL HIGH (ref 11.5–14.5)
WBC: 5.7 10*3/uL (ref 3.8–10.6)

## 2017-03-09 MED ORDER — GABAPENTIN 300 MG PO CAPS
300.0000 mg | ORAL_CAPSULE | Freq: Every day | ORAL | Status: DC
  Administered 2017-03-09: 300 mg via ORAL
  Filled 2017-03-09: qty 1

## 2017-03-09 MED ORDER — SEVELAMER CARBONATE 800 MG PO TABS
2400.0000 mg | ORAL_TABLET | Freq: Three times a day (TID) | ORAL | Status: DC
  Administered 2017-03-09: 2400 mg via ORAL
  Filled 2017-03-09 (×3): qty 3

## 2017-03-09 MED ORDER — ATORVASTATIN CALCIUM 20 MG PO TABS
40.0000 mg | ORAL_TABLET | Freq: Every day | ORAL | Status: DC
  Administered 2017-03-09: 40 mg via ORAL
  Filled 2017-03-09: qty 2

## 2017-03-09 MED ORDER — OXYCODONE HCL 5 MG PO TABS
5.0000 mg | ORAL_TABLET | Freq: Four times a day (QID) | ORAL | Status: DC | PRN
  Administered 2017-03-09 (×3): 5 mg via ORAL
  Filled 2017-03-09 (×3): qty 1

## 2017-03-09 MED ORDER — MOMETASONE FURO-FORMOTEROL FUM 200-5 MCG/ACT IN AERO
2.0000 | INHALATION_SPRAY | Freq: Two times a day (BID) | RESPIRATORY_TRACT | Status: DC
  Filled 2017-03-09: qty 8.8

## 2017-03-09 MED ORDER — IPRATROPIUM-ALBUTEROL 0.5-2.5 (3) MG/3ML IN SOLN: 3.0000 mL | Freq: Four times a day (QID) | RESPIRATORY_TRACT | Status: DC | PRN

## 2017-03-09 MED ORDER — HEPARIN SODIUM (PORCINE) 5000 UNIT/ML IJ SOLN
5000.0000 [IU] | Freq: Three times a day (TID) | INTRAMUSCULAR | Status: DC
  Administered 2017-03-09 (×2): 5000 [IU] via SUBCUTANEOUS
  Filled 2017-03-09 (×2): qty 1

## 2017-03-09 MED ORDER — LABETALOL HCL 100 MG PO TABS
100.0000 mg | ORAL_TABLET | Freq: Every day | ORAL | Status: DC
  Administered 2017-03-09: 100 mg via ORAL
  Filled 2017-03-09: qty 1

## 2017-03-09 MED ORDER — ONDANSETRON HCL 4 MG/2ML IJ SOLN
4.0000 mg | Freq: Four times a day (QID) | INTRAMUSCULAR | Status: DC | PRN
  Administered 2017-03-09 (×2): 4 mg via INTRAVENOUS
  Filled 2017-03-09: qty 2

## 2017-03-09 MED ORDER — NICOTINE 21 MG/24HR TD PT24
21.0000 mg | MEDICATED_PATCH | Freq: Every day | TRANSDERMAL | Status: DC
  Filled 2017-03-09: qty 1

## 2017-03-09 MED ORDER — NITROGLYCERIN 0.4 MG SL SUBL: 0.4000 mg | SUBLINGUAL_TABLET | SUBLINGUAL | Status: DC | PRN

## 2017-03-09 MED ORDER — ASPIRIN EC 81 MG PO TBEC
81.0000 mg | DELAYED_RELEASE_TABLET | Freq: Every day | ORAL | Status: DC
  Administered 2017-03-09: 81 mg via ORAL
  Filled 2017-03-09: qty 1

## 2017-03-09 MED ORDER — ONDANSETRON HCL 4 MG PO TABS: 4.0000 mg | ORAL_TABLET | Freq: Four times a day (QID) | ORAL | Status: DC | PRN

## 2017-03-09 MED ORDER — PANTOPRAZOLE SODIUM 40 MG PO TBEC
40.0000 mg | DELAYED_RELEASE_TABLET | Freq: Every day | ORAL | Status: DC
  Administered 2017-03-09: 40 mg via ORAL
  Filled 2017-03-09: qty 1

## 2017-03-09 MED ORDER — FUROSEMIDE 40 MG PO TABS
80.0000 mg | ORAL_TABLET | Freq: Every day | ORAL | Status: DC
  Administered 2017-03-09: 80 mg via ORAL
  Filled 2017-03-09: qty 2

## 2017-03-09 MED ORDER — TIOTROPIUM BROMIDE MONOHYDRATE 18 MCG IN CAPS
18.0000 ug | ORAL_CAPSULE | Freq: Every day | RESPIRATORY_TRACT | Status: DC
  Administered 2017-03-09: 18 ug via RESPIRATORY_TRACT
  Filled 2017-03-09: qty 5

## 2017-03-09 MED ORDER — LACTULOSE 10 GM/15ML PO SOLN: 20.0000 g | Freq: Three times a day (TID) | ORAL | Status: DC

## 2017-03-09 MED ORDER — ONDANSETRON HCL 4 MG/2ML IJ SOLN
INTRAMUSCULAR | Status: AC
  Administered 2017-03-09: 4 mg via INTRAVENOUS
  Filled 2017-03-09: qty 2

## 2017-03-09 NOTE — Discharge Summary (Signed)
Stanley [name] at [place] NAME: [name]    MR#:  [id number]  DATE OF BIRTH:  November 07, 1960  DATE OF ADMISSION:  03/03/2017 ADMITTING PHYSICIAN: Demetrios Loll, MD  DATE OF DISCHARGE: 03/04/2017  4:20 PM  PRIMARY CARE PHYSICIAN: Revelo, Elyse Jarvis, MD   ADMISSION DIAGNOSIS:  Hyperkalemia [E87.5]  DISCHARGE DIAGNOSIS:  Active Problems:   Hemodialysis patient Stanley [name])   SECONDARY DIAGNOSIS:   Past Medical History:  Diagnosis Date  . Alcohol abuse   . CHF (congestive heart failure) (Stanley [name])   . Cirrhosis (Stanley [name])   . Coronary artery disease 2009  . Diabetic peripheral neuropathy (Stanley [name])   . Drug abuse (Stanley [name])   . End stage renal disease on dialysis [facility]) NEPHROLOGIST-   [facility]  IN [name]   HEMODIALYSIS --   TUES/  THURS/  SAT  . GERD (gastroesophageal reflux disease)   . Hyperlipidemia   . Hypertension   . PAD (peripheral artery disease) (Stanley [name])   . Renal insufficiency    Per pt, 32 oz fluid restriction per day  . S/P triple vessel bypass 06/09/2016   2009ish  . Suicidal ideation    & HOMICIDAL IDEATION --  06-16-2013   ADMITTED TO BEHAVIOR HEALTH     ADMITTING HISTORY  HISTORY OF PRESENT ILLNESS:  Stanley [name]  is a 56 y.o. male with a known history of multiple medical problems as below.  The patient comes to the ED due to no place to get hemodialysis.  He denies any symptoms.  He was just discharged yesterday from the hospital after treatment of hyperkalemia.  He ED, he was found potassium 6.2.  ED physician discussed with the nephrologist who suggested admit the patient to get hemodialysis tomorrow.     HOSPITAL COURSE:   *end Stage renal disease on hemodialysis with hyperkalemia.  This was due to noncompliance. Patient has had multiple admissions for the same problem.  Counseled patient to be compliant with dialysis.  Patient had dialysis in the hospital with improvement of hyperkalemia and some shortness of breath.  Discharged  home in stable condition.  High risk for readmission due to noncompliance with dialysis.  CONSULTS OBTAINED:  Treatment Team:  Lavonia Dana, MD  DRUG ALLERGIES:  No Known Allergies  DISCHARGE MEDICATIONS:   Allergies as of 03/04/2017   No Known Allergies     Medication List    TAKE these medications   albuterol 108 (90 Base) MCG/ACT inhaler Commonly known as:  PROVENTIL HFA;VENTOLIN HFA Inhale 2 puffs into the lungs every 6 (six) hours as needed for wheezing or shortness of breath.   alum & mag hydroxide-simeth 200-200-20 MG/5ML suspension Commonly known as:  MAALOX/MYLANTA Take 15 mLs every 4 (four) hours as needed by mouth for indigestion or heartburn.   aspirin 81 MG EC tablet Take 81 mg by mouth daily.   atorvastatin 40 MG tablet Commonly known as:  LIPITOR Take 1 tablet (40 mg total) by mouth daily.   budesonide-formoterol 160-4.5 MCG/ACT inhaler Commonly known as:  SYMBICORT Inhale 2 puffs into the lungs daily.   DIALYVITE SUPREME D 3 MG Tabs Take 1 tablet (3 mg total) by mouth daily.   ferrous sulfate 325 (65 FE) MG tablet Take 1 tablet by mouth 3 (three) times daily.   furosemide 80 MG tablet Commonly known as:  LASIX Take 1 tablet (80 mg total) daily by mouth.   gabapentin 300 MG capsule Commonly known as:  NEURONTIN TAKE 1 CAPSULE BY MOUTH  EVERY EVENING AND 1 EXTRA CAP ON DIALYSIS DAYS AS NEEDED FOR NERVE PAIN   ipratropium-albuterol 0.5-2.5 (3) MG/3ML Soln Commonly known as:  DUONEB Take 3 mLs by nebulization every 6 (six) hours as needed.   labetalol 100 MG tablet Commonly known as:  NORMODYNE Take 1 tablet (100 mg total) by mouth daily.   lactulose 10 GM/15ML solution Commonly known as:  CHRONULAC Take 30 mLs (20 g total) by mouth 3 (three) times daily.   nicotine 21 mg/24hr patch Commonly known as:  NICODERM CQ - dosed in mg/24 hours Place 1 patch (21 mg total) onto the skin daily.   nitroGLYCERIN 0.4 MG SL tablet Commonly known  as:  NITROSTAT Place 1 tablet (0.4 mg total) under the tongue every 5 (five) minutes as needed.   pantoprazole 40 MG tablet Commonly known as:  PROTONIX Take 1 tablet (40 mg total) by mouth daily.   sevelamer carbonate 800 MG tablet Commonly known as:  RENVELA Take 3 tablets (2,400 mg total) by mouth 3 (three) times daily with meals.   Spacer/Aero Chamber Mouthpiece Misc 1 Units by Does not apply route every 4 (four) hours as needed (wheezing).   tiotropium 18 MCG inhalation capsule Commonly known as:  SPIRIVA HANDIHALER Place 1 capsule (18 mcg total) into inhaler and inhale daily.       Today   VITAL SIGNS:  Blood pressure 125/70, pulse 84, temperature 97.7 F (36.5 C), temperature source Oral, resp. rate 20, height 6\' 3"  (1.905 m), weight 74.8 kg (164 lb 14.5 oz), SpO2 98 %.  I/O:  No intake or output data in the 24 hours ending 03/09/17 1649  PHYSICAL EXAMINATION:  Physical Exam  GENERAL:  56 y.o.-year-old patient lying in the bed with no acute distress.  LUNGS: Normal breath sounds bilaterally, no wheezing, rales,rhonchi or crepitation. No use of accessory muscles of respiration.  CARDIOVASCULAR: S1, S2 normal. No murmurs, rubs, or gallops.  ABDOMEN: Soft, non-tender, non-distended. Bowel sounds present. No organomegaly or mass.  NEUROLOGIC: Moves all 4 extremities. PSYCHIATRIC: The patient is alert and oriented x 3.  SKIN: No obvious rash, lesion, or ulcer.   DATA REVIEW:   CBC Recent Labs  Lab 03/09/17 0449  WBC 5.7  HGB 9.3*  HCT 29.5*  PLT 309    Chemistries  Recent Labs  Lab 03/03/17 1655  03/09/17 0449  NA 140   < > 135  K 6.2*   < > 5.4*  CL 98*   < > 96*  CO2 31   < > 26  GLUCOSE 129*   < > 95  BUN 56*   < > 57*  CREATININE 8.29*   < > 10.46*  CALCIUM 8.4*   < > 7.9*  AST 70*  --   --   ALT 71*  --   --   ALKPHOS 247*  --   --   BILITOT 0.6  --   --    < > = values in this interval not displayed.    Cardiac Enzymes No results for  input(s): TROPONINI in the last 168 hours.  Microbiology Results  Results for orders placed or performed during the hospital encounter of 02/22/17  Blood culture (routine x 2)     Status: None   Collection Time: 02/22/17  2:03 PM  Result Value Ref Range Status   Specimen Description BLOOD LFOA  Final   Special Requests   Final    BOTTLES DRAWN AEROBIC AND ANAEROBIC Blood Culture adequate  volume   Culture NO GROWTH 5 DAYS  Final   Report Status 02/27/2017 FINAL  Final  Blood culture (routine x 2)     Status: None   Collection Time: 02/22/17  2:03 PM  Result Value Ref Range Status   Specimen Description BLOOD LEFT HAND  Final   Special Requests   Final    BOTTLES DRAWN AEROBIC AND ANAEROBIC Blood Culture adequate volume   Culture NO GROWTH 5 DAYS  Final   Report Status 02/27/2017 FINAL  Final  MRSA PCR Screening     Status: None   Collection Time: 02/22/17  8:47 PM  Result Value Ref Range Status   MRSA by PCR NEGATIVE NEGATIVE Final    Comment:        The GeneXpert MRSA Assay (FDA approved for NASAL specimens only), is one component of a comprehensive MRSA colonization surveillance program. It is not intended to diagnose MRSA infection nor to guide or monitor treatment for MRSA infections.     RADIOLOGY:  Dg Chest 2 View  Result Date: 03/08/2017 CLINICAL DATA:  Dyspnea.  Dialysis patient. EXAM: CHEST  2 VIEW COMPARISON:  Chest x-ray dated 02/28/2017. FINDINGS: Cardiomegaly is stable. Atherosclerotic changes noted at the aortic arch. Median sternotomy wires appear intact and stable in alignment. Lungs are hyperexpanded. Coarse lung markings bilaterally are compatible with chronic interstitial lung disease and/or chronic bronchitic change. No confluent opacity to suggest a developing pneumonia. No pleural effusion or pneumothorax seen. No evidence of pulmonary edema. IMPRESSION: 1. No active cardiopulmonary disease. No evidence of pneumonia or pulmonary edema. 2. COPD. Associated  chronic interstitial lung disease and/or chronic bronchitic changes. 3. Stable cardiomegaly.  No evidence of active CHF. 4. Aortic atherosclerosis. Electronically Signed   By: Franki Cabot M.D.   On: 03/08/2017 20:30    Follow up with PCP in 1 week.  Management plans discussed with the patient, family and they are in agreement.  CODE STATUS:  Code Status History    Date Active Date Inactive Code Status Order ID Comments User Context   03/03/2017 20:27 03/04/2017 19:20 Full Code [id number]  Demetrios Loll, MD Inpatient   02/28/2017 22:54 03/02/2017 15:19 Full Code [id number]  Lance Coon, MD Inpatient   02/22/2017 17:21 02/24/2017 13:59 Full Code [id number]  Dustin Flock, MD Inpatient   01/22/2017 23:37 01/25/2017 15:22 Full Code [id number]  Gorden Harms, MD Inpatient   01/07/2017 14:45 01/13/2017 18:12 Full Code [id number]  Idelle Crouch, MD Inpatient   12/22/2016 14:31 12/24/2016 18:04 Full Code [id number]  Hillary Bow, MD ED   12/20/2016 20:20 12/21/2016 18:53 Full Code [id number]  Demetrios Loll, MD Inpatient   11/26/2016 10:30 11/28/2016 15:22 Full Code [id number]  Henreitta Leber, MD Inpatient   11/15/2016 19:30 11/16/2016 18:22 Full Code [id number]  Fritzi Mandes, MD Inpatient   10/29/2016 14:53 10/30/2016 15:06 Full Code [id number]  Loletha Grayer, MD ED   09/15/2016 17:57 09/16/2016 18:17 Full Code [id number]  Dustin Flock, MD ED   08/16/2016 09:45 08/18/2016 18:16 Full Code [id number]  Hillary Bow, MD ED   08/09/2016 16:59 08/12/2016 20:34 Full Code [id number]  Fritzi Mandes, MD Inpatient   07/03/2016 21:54 07/05/2016 21:22 Full Code [id number]  Demetrios Loll, MD Inpatient   06/24/2016 04:23 06/26/2016 15:46 Full Code [id number]  Saundra Shelling, MD Inpatient   06/14/2016 14:53 06/19/2016 21:04 Full Code [id number]  Hillary Bow, MD ED   05/31/2016 06:42 06/01/2016 21:30 Full Code [id number]  Saundra Shelling, MD Inpatient   05/05/2016 23:09 05/10/2016 21:57  Full Code [id number]  Vianne Bulls, MD Inpatient    04/16/2016 03:56 04/17/2016 18:27 Full Code [id number]  Saundra Shelling, MD Inpatient   01/19/2016 15:13 01/22/2016 14:14 Full Code [id number]  Loletha Grayer, MD ED   09/24/2015 10:33 09/25/2015 13:31 Full Code [id number]  Loletha Grayer, MD ED   05/16/2015 14:17 05/17/2015 20:57 Full Code [id number]  Idelle Crouch, MD Inpatient   04/30/2015 20:08 05/08/2015 19:40 Full Code [id number]  Vaughan Basta, MD Inpatient   04/06/2015 05:07 04/07/2015 17:36 Full Code [id number]  Lance Coon, MD Inpatient   03/23/2015 11:07 03/27/2015 12:46 Full Code [id number]  Bettey Costa, MD Inpatient   06/19/2013 11:32 06/21/2013 17:49 Full Code [id number]  Cristal Ford, DO Inpatient   06/16/2013 17:26 06/19/2013 11:32 Full Code [id number]  Margarita Mail, PA-C ED   06/05/2013 15:33 06/06/2013 18:56 Full Code [id number]  Theodoro Kos, DO Inpatient   06/02/2013 09:01 06/05/2013 15:33 Full Code [id number]  Velvet Bathe, MD Inpatient   06/02/2013 08:40 06/02/2013 09:01 Full Code [id number]  Velvet Bathe, MD Inpatient      TOTAL TIME TAKING CARE OF THIS PATIENT ON DAY OF DISCHARGE: more than 30 minutes.   Leia Alf Kenlynn Houde M.D on 03/09/2017 at 4:49 PM  Between 7am to 6pm - Pager - [phone]  After 6pm go to [email or web address] - password [email or web address] [facility]  Office  [phone]  CC: Primary care physician; [name], MD  Note: This dictation was prepared with Dragon dictation along with smaller phrase technology. Any transcriptional errors that result from this process are unintentional.

## 2017-03-09 NOTE — Progress Notes (Signed)
[facility] [place], [place] 03/09/17  Subjective:   Patient has been d/c from his usual unit He has come in to ER for feeling poorly and possible uremia K 5.4 today Dialysis requested by IM team  Objective:  Vital signs in last 24 hours:  Temp:  [98 F (36.7 C)-98.4 F (36.9 C)] 98 F (36.7 C) (12/27 1426) Pulse Rate:  [68-91] 86 (12/27 1426) Resp:  [8-20] 18 (12/27 1426) BP: (106-153)/(64-104) 128/73 (12/27 1426) SpO2:  [93 %-100 %] 97 % (12/27 1426) Weight:  [74.8 kg (165 lb)-78.9 kg (173 lb 15.1 oz)] 78.9 kg (173 lb 15.1 oz) (12/27 1004)  Weight change:  Filed Weights   03/08/17 1638 03/09/17 1004  Weight: 74.8 kg (165 lb) 78.9 kg (173 lb 15.1 oz)    Intake/Output:    Intake/Output Summary (Last 24 hours) at 03/09/2017 1611 Last data filed at 03/09/2017 1345 Gross per 24 hour  Intake 480 ml  Output 2000 ml  Net -1520 ml     Physical Exam: General: No acute distress, laying in the bed  HEENT anicteric  Neck supple  Pulm/lungs Clear to auscultation  CVS/Heart Regular no rub  Abdomen:  distended  Extremities: Trace edema  Neurologic: Alert, oriented  Skin: No acute rashes  Access: AV fistula       Basic Metabolic Panel:  Recent Labs  Lab 03/03/17 1655 03/04/17 0337 03/08/17 1638 03/09/17 0449  NA 140 138 139 135  K 6.2* 6.3* 4.7 5.4*  CL 98* 99* 96* 96*  CO2 31 28 31 26   GLUCOSE 129* 102* 104* 95  BUN 56* 57* 55* 57*  CREATININE 8.29* 8.80* 10.72* 10.46*  CALCIUM 8.4* 8.3* 8.6* 7.9*     CBC: Recent Labs  Lab 03/03/17 1655 03/04/17 0337 03/08/17 1638 03/09/17 0449  WBC 5.1 5.1 5.4 5.7  HGB 8.2* 8.3* 9.9* 9.3*  HCT 26.6* 27.7* 31.2* 29.5*  MCV 86.4 87.8 86.0 85.6  PLT 224 229 273 309      Lab Results  Component Value Date   HEPBSAG Negative 03/01/2017   HEPBSAB Reactive 03/01/2017   HEPBIGM Negative 06/01/2016      Microbiology:  No results found for this or any previous visit (from the past 240  hour(s)).  Coagulation Studies: No results for input(s): LABPROT, INR in the last 72 hours.  Urinalysis: No results for input(s): COLORURINE, LABSPEC, PHURINE, GLUCOSEU, HGBUR, BILIRUBINUR, KETONESUR, PROTEINUR, UROBILINOGEN, NITRITE, LEUKOCYTESUR in the last 72 hours.  Invalid input(s): APPERANCEUR    Imaging: Dg Chest 2 View  Result Date: 03/08/2017 CLINICAL DATA:  Dyspnea.  Dialysis patient. EXAM: CHEST  2 VIEW COMPARISON:  Chest x-ray dated 02/28/2017. FINDINGS: Cardiomegaly is stable. Atherosclerotic changes noted at the aortic arch. Median sternotomy wires appear intact and stable in alignment. Lungs are hyperexpanded. Coarse lung markings bilaterally are compatible with chronic interstitial lung disease and/or chronic bronchitic change. No confluent opacity to suggest a developing pneumonia. No pleural effusion or pneumothorax seen. No evidence of pulmonary edema. IMPRESSION: 1. No active cardiopulmonary disease. No evidence of pneumonia or pulmonary edema. 2. COPD. Associated chronic interstitial lung disease and/or chronic bronchitic changes. 3. Stable cardiomegaly.  No evidence of active CHF. 4. Aortic atherosclerosis. Electronically Signed   By: Franki Cabot M.D.   On: 03/08/2017 20:30     Medications:    . aspirin EC  81 mg Oral Daily  . atorvastatin  40 mg Oral Daily  . furosemide  80 mg Oral Daily  . gabapentin  300 mg Oral Daily  . heparin  5,000 Units Subcutaneous Q8H  . labetalol  100 mg Oral Daily  . lactulose  20 g Oral TID  . mometasone-formoterol  2 puff Inhalation BID  . nicotine  21 mg Transdermal Daily  . pantoprazole  40 mg Oral Daily  . sevelamer carbonate  2,400 mg Oral TID WC  . tiotropium  18 mcg Inhalation Daily   alum & mag hydroxide-simeth, ipratropium-albuterol, nitroGLYCERIN, ondansetron **OR** ondansetron (ZOFRAN) IV, oxyCODONE  Assessment/ Plan:  56 y.o. male  withend stage renal disease on hemodialysis secondary to Alport's syndrome,  ascites, hypertension, anemia of chronic kidney disease, coronary artery disease, peripheral vascular disease, hyperlipidemia, gastrointestinal AVMs, congestive heart failure  [facility] [place] AVF- no dialysis unit currently  1.  Uremia 2.  Ascites 3.  End-stage renal disease 4.  Anemia of CKD 5.  Secondary hyperparathyroidism 6. HTN  Plan: Dialysis to correct uremia Unfortunately, patient does not have an outpatient unit to go to Patient pathways team is trying for [facility] In the meantime, patient will have to return to the emergency room for future dialysis     LOS: 0 Aeriel Boulay [facility] 12/27/20184:11 PM  [place], [place]

## 2017-03-09 NOTE — Progress Notes (Signed)
HD started. 

## 2017-03-09 NOTE — Progress Notes (Signed)
Patient verbally aggressive on the phone. Patient states he does not have time to wait and attempted to leave. Pt seemed anxious and upset. This nurse stopped him to remove his IV out and so he can sign the AMA form. Patient agreed to sign AMA. MD Vachhani notified.

## 2017-03-09 NOTE — ED Notes (Signed)
Pt transported to room 147

## 2017-03-09 NOTE — Progress Notes (Signed)
Post HD  

## 2017-03-09 NOTE — Discharge Summary (Signed)
Date of discharge- 03/09/2017  Admission and discharge diagnoses   Uremia   Missed hemodialysis   End-stage renal disease   Hypertension  Hospital course   Patient had recurrent admissions in the hospital for similar reasons in last few months due to missing hemodialysis. He does not have outpatient dialysis center due to his abuse even aggressive behavior to the dialysis staff. After missing a few sessions he comes to emergency room with fluid overload or electrolyte imbalance and uremia and felt much better after he received his hemodialysis. I had talked to him and case manager about getting arrangements of his hemodialysis as outpatient today. But he decided to leave [place] without waiting for arrangements for outpatient hemodialysis.

## 2017-03-09 NOTE — Progress Notes (Signed)
[place] [facility] [place] NAME: Stanley [name]    MR#:  [id number]  DATE OF BIRTH:  02-24-1961  SUBJECTIVE:  CHIEF COMPLAINT:   Chief Complaint  Patient presents with  . Emesis   Came with worsening shortness of breath due to missing hemodialysis. Seen after hemodialysis today and was feeling much better.  REVIEW OF SYSTEMS:  CONSTITUTIONAL: No fever, fatigue or weakness.  EYES: No blurred or double vision.  EARS, NOSE, AND THROAT: No tinnitus or ear pain.  RESPIRATORY: No cough, shortness of breath, wheezing or hemoptysis.  CARDIOVASCULAR: No chest pain, orthopnea, edema.  GASTROINTESTINAL: No nausea, vomiting, diarrhea or abdominal pain.  GENITOURINARY: No dysuria, hematuria.  ENDOCRINE: No polyuria, nocturia,  HEMATOLOGY: No anemia, easy bruising or bleeding SKIN: No rash or lesion. MUSCULOSKELETAL: No joint pain or arthritis.   NEUROLOGIC: No tingling, numbness, weakness.  PSYCHIATRY: No anxiety or depression.   ROS  DRUG ALLERGIES:  No Known Allergies  VITALS:  Blood pressure 128/73, pulse 86, temperature 98 F (36.7 C), temperature source Oral, resp. rate 18, height 6\' 3"  (1.905 m), weight 78.9 kg (173 lb 15.1 oz), SpO2 97 %.  PHYSICAL EXAMINATION:  GENERAL:  56 y.o.-year-old patient lying in the bed with no acute distress.  EYES: Pupils equal, round, reactive to light and accommodation. No scleral icterus. Extraocular muscles intact.  HEENT: Head atraumatic, normocephalic. Oropharynx and nasopharynx clear.  NECK:  Supple, no jugular venous distention. No thyroid enlargement, no tenderness.  LUNGS: Normal breath sounds bilaterally, no wheezing, rales,rhonchi or crepitation. No use of accessory muscles of respiration.  CARDIOVASCULAR: S1, S2 normal. No murmurs, rubs, or gallops.  ABDOMEN: Soft, nontender, nondistended. Bowel sounds present. No organomegaly or mass.  EXTREMITIES: No pedal edema, cyanosis, or clubbing.   NEUROLOGIC: Cranial nerves II through XII are intact. Muscle strength 5/5 in all extremities. Sensation intact. Gait not checked.  PSYCHIATRIC: The patient is alert and oriented x 3.  SKIN: No obvious rash, lesion, or ulcer.   Physical Exam LABORATORY PANEL:   CBC Recent Labs  Lab 03/09/17 0449  WBC 5.7  HGB 9.3*  HCT 29.5*  PLT 309   ------------------------------------------------------------------------------------------------------------------  Chemistries  Recent Labs  Lab 03/03/17 1655  03/09/17 0449  NA 140   < > 135  K 6.2*   < > 5.4*  CL 98*   < > 96*  CO2 31   < > 26  GLUCOSE 129*   < > 95  BUN 56*   < > 57*  CREATININE 8.29*   < > 10.46*  CALCIUM 8.4*   < > 7.9*  AST 70*  --   --   ALT 71*  --   --   ALKPHOS 247*  --   --   BILITOT 0.6  --   --    < > = values in this interval not displayed.   ------------------------------------------------------------------------------------------------------------------  Cardiac Enzymes No results for input(s): TROPONINI in the last 168 hours. ------------------------------------------------------------------------------------------------------------------  RADIOLOGY:  Dg Chest 2 View  Result Date: 03/08/2017 CLINICAL DATA:  Dyspnea.  Dialysis patient. EXAM: CHEST  2 VIEW COMPARISON:  Chest x-ray dated 02/28/2017. FINDINGS: Cardiomegaly is stable. Atherosclerotic changes noted at the aortic arch. Median sternotomy wires appear intact and stable in alignment. Lungs are hyperexpanded. Coarse lung markings bilaterally are compatible with chronic interstitial lung disease and/or chronic bronchitic change. No confluent opacity to suggest a developing pneumonia. No pleural effusion or pneumothorax seen. No evidence of pulmonary  edema. IMPRESSION: 1. No active cardiopulmonary disease. No evidence of pneumonia or pulmonary edema. 2. COPD. Associated chronic interstitial lung disease and/or chronic bronchitic changes. 3. Stable  cardiomegaly.  No evidence of active CHF. 4. Aortic atherosclerosis. Electronically Signed   By: Franki Cabot M.D.   On: 03/08/2017 20:30    ASSESSMENT AND PLAN:   Principal Problem:   Uremia Active Problems:   Coronary atherosclerosis of native coronary artery   ESRD on dialysis ([place])   HTN (hypertension)   Cirrhosis of liver with ascites ([facility])   Alcohol abuse   COPD (chronic obstructive pulmonary disease) ([facility])   * Uremia -due to missing dialysis,   consult nephrology for dialysis- received dialysis today, case manager need to help with the arrangement of outpatient hemodialysis.  *  ESRD on dialysis [facility]) -nephrology consult and dialysis as above  *  Coronary atherosclerosis of native coronary artery -continue home meds *  HTN (hypertension) -continue home medications *  COPD (chronic obstructive pulmonary disease) ([place]) -home dose inhalers     All the records are reviewed and case discussed with Care Management/Social Workerr. Management plans discussed with the patient, family and they are in agreement.  CODE STATUS: Full code.  TOTAL TIME TAKING CARE OF THIS PATIENT: 35 minutes.     POSSIBLE D/C IN 1-2 DAYS, DEPENDING ON CLINICAL CONDITION.   Vaughan Basta M.D on 03/09/2017   Between 7am to 6pm - Pager - [phone]  After 6pm go to [email or web address] - password [email or web address] [facility]  Office  [phone]  CC: Primary care physician; [name], MD  Note: This dictation was prepared with Dragon dictation along with smaller phrase technology. Any transcriptional errors that result from this process are unintentional.

## 2017-03-09 NOTE — Care Management (Signed)
Notified dialysis coordinator patient signed out AMA

## 2017-03-09 NOTE — Progress Notes (Signed)
Pre HD  

## 2017-03-09 NOTE — Care Management (Signed)
Notified dialysis coordinator of patient admission to 1 a.

## 2017-03-09 NOTE — Care Management Obs Status (Signed)
[place] NOTIFICATION   Patient Details  Name: Osker Ayoub MRN: [id number] Date of Birth: 10-Aug-1960   Medicare Observation Status Notification Given:  Yes    Jolly Mango, RN 03/09/2017, 2:24 PM

## 2017-03-11 ENCOUNTER — Encounter: Payer: Self-pay | Admitting: Emergency Medicine

## 2017-03-11 ENCOUNTER — Emergency Department: Payer: Medicare Other

## 2017-03-11 ENCOUNTER — Other Ambulatory Visit: Payer: Self-pay

## 2017-03-11 ENCOUNTER — Emergency Department
Admission: EM | Admit: 2017-03-11 | Discharge: 2017-03-11 | Discharge status: Against Medical Advice | Payer: Medicare Other | Attending: Student in an Organized Health Care Education/Training Program | Admitting: Student in an Organized Health Care Education/Training Program

## 2017-03-11 DIAGNOSIS — J984 Other disorders of lung: Secondary | ICD-10-CM | POA: Diagnosis not present

## 2017-03-11 DIAGNOSIS — D631 Anemia in chronic kidney disease: Secondary | ICD-10-CM | POA: Diagnosis not present

## 2017-03-11 DIAGNOSIS — Z951 Presence of aortocoronary bypass graft: Secondary | ICD-10-CM | POA: Insufficient documentation

## 2017-03-11 DIAGNOSIS — Z992 Dependence on renal dialysis: Secondary | ICD-10-CM | POA: Diagnosis not present

## 2017-03-11 DIAGNOSIS — F17210 Nicotine dependence, cigarettes, uncomplicated: Secondary | ICD-10-CM | POA: Insufficient documentation

## 2017-03-11 DIAGNOSIS — Z79899 Other long term (current) drug therapy: Secondary | ICD-10-CM | POA: Insufficient documentation

## 2017-03-11 DIAGNOSIS — I132 Hypertensive heart and chronic kidney disease with heart failure and with stage 5 chronic kidney disease, or end stage renal disease: Secondary | ICD-10-CM | POA: Diagnosis not present

## 2017-03-11 DIAGNOSIS — Z7982 Long term (current) use of aspirin: Secondary | ICD-10-CM | POA: Insufficient documentation

## 2017-03-11 DIAGNOSIS — N186 End stage renal disease: Secondary | ICD-10-CM | POA: Diagnosis not present

## 2017-03-11 DIAGNOSIS — I509 Heart failure, unspecified: Secondary | ICD-10-CM | POA: Diagnosis not present

## 2017-03-11 DIAGNOSIS — N19 Unspecified kidney failure: Secondary | ICD-10-CM | POA: Diagnosis not present

## 2017-03-11 DIAGNOSIS — R600 Localized edema: Secondary | ICD-10-CM | POA: Diagnosis not present

## 2017-03-11 DIAGNOSIS — R188 Other ascites: Secondary | ICD-10-CM | POA: Diagnosis not present

## 2017-03-11 LAB — BASIC METABOLIC PANEL
ANION GAP: 11 (ref 5–15)
BUN: 37 mg/dL — ABNORMAL HIGH (ref 6–20)
CALCIUM: 8.1 mg/dL — AB (ref 8.9–10.3)
CO2: 29 mmol/L (ref 22–32)
Chloride: 95 mmol/L — ABNORMAL LOW (ref 101–111)
Creatinine, Ser: 8.25 mg/dL — ABNORMAL HIGH (ref 0.61–1.24)
GFR, EST AFRICAN AMERICAN: 7 mL/min — AB (ref >60)
GFR, EST NON AFRICAN AMERICAN: 6 mL/min — AB (ref >60)
Glucose, Bld: 82 mg/dL (ref 65–99)
Potassium: 4.8 mmol/L (ref 3.5–5.1)
Sodium: 135 mmol/L (ref 135–145)

## 2017-03-11 MED ORDER — EPOETIN ALFA 4000 UNIT/ML IJ SOLN
4000.0000 [IU] | Freq: Once | INTRAMUSCULAR | Status: DC
  Filled 2017-03-11: qty 1

## 2017-03-11 NOTE — ED Notes (Signed)
Pt needs to receive dialysis. Last treatment Thursday.

## 2017-03-11 NOTE — ED Notes (Signed)
Pt agitated. Yelling on phone. Cursing at staff. States, " I am going to go to [place] where a real hospital is" Pt has been told several times that dialysis knows he is here and we are waiting on dialysis to be ready

## 2017-03-11 NOTE — ED Notes (Addendum)
Pt pulled out chicken and cake from his back pack and is eating

## 2017-03-11 NOTE — ED Provider Notes (Signed)
[facility] Emergency Department Provider Note    First MD Initiated Contact with Patient 03/11/17 1030     (approximate)  I have reviewed the triage vital signs and the nursing notes.   HISTORY  Chief Complaint Dialysis    HPI Stanley [name] is a 56 y.o. male with a history of the below listed chronic medical conditions as well as end-stage renal disease on now Monday Wednesday Friday dialysis presents to the ER for dialysis.  Patient denies any new acute complaints.  Patient has been fired from multiple outpatient dialysis centers.  Denies any worsening shortness of breath.  Past Medical History:  Diagnosis Date  . Alcohol abuse   . CHF (congestive heart failure) ([place])   . Cirrhosis ([place])   . Coronary artery disease 2009  . Diabetic peripheral neuropathy ([place])   . Drug abuse ([place])   . End stage renal disease on dialysis [facility]) NEPHROLOGIST-   DR [facility]  IN [place]   HEMODIALYSIS --   TUES/  THURS/  SAT  . GERD (gastroesophageal reflux disease)   . Hyperlipidemia   . Hypertension   . PAD (peripheral artery disease) ([place])   . Renal insufficiency    Per pt, 32 oz fluid restriction per day  . S/P triple vessel bypass 06/09/2016   2009ish  . Suicidal ideation    & HOMICIDAL IDEATION --  06-16-2013   ADMITTED TO BEHAVIOR HEALTH   Family History  Problem Relation Age of Onset  . Colon cancer Mother   . Cancer Father   . Cancer Sister   . Kidney disease Brother    Past Surgical History:  Procedure Laterality Date  . AGILE CAPSULE N/A 06/19/2016   Procedure: AGILE CAPSULE;  Surgeon: [name], MD;  Location: [facility] ENDOSCOPY;  Service: Endoscopy;  Laterality: N/A;  . COLONOSCOPY WITH PROPOFOL N/A 06/18/2016   Procedure: COLONOSCOPY WITH PROPOFOL;  Surgeon: [name], MD;  Location: [facility] ENDOSCOPY;  Service: Endoscopy;  Laterality: N/A;  . COLONOSCOPY WITH PROPOFOL N/A 08/12/2016   Procedure: COLONOSCOPY WITH PROPOFOL;  Surgeon: [name], MD;   Location: [facility] ENDOSCOPY;  Service: Endoscopy;  Laterality: N/A;  . CORONARY ANGIOPLASTY  ?   PT UNABLE TO TELL IF  BEFORE OR AFTER  CABG  . CORONARY ARTERY BYPASS GRAFT  2008  ([place])   3 VESSEL  . DIALYSIS FISTULA CREATION  LAST SURGERY  APPOX  2008  . ENTEROSCOPY N/A 05/10/2016   Procedure: ENTEROSCOPY;  Surgeon: Jerene Bears, MD;  Location: [place];  Service: Gastroenterology;  Laterality: N/A;  . ENTEROSCOPY N/A 08/12/2016   Procedure: ENTEROSCOPY;  Surgeon: [name], MD;  Location: [facility] ENDOSCOPY;  Service: Endoscopy;  Laterality: N/A;  . ESOPHAGOGASTRODUODENOSCOPY N/A 05/07/2015   Procedure: ESOPHAGOGASTRODUODENOSCOPY (EGD);  Surgeon: Hulen Luster, MD;  Location: [facility] ENDOSCOPY;  Service: Endoscopy;  Laterality: N/A;  . ESOPHAGOGASTRODUODENOSCOPY (EGD) WITH PROPOFOL N/A 05/17/2015   Procedure: ESOPHAGOGASTRODUODENOSCOPY (EGD) WITH PROPOFOL;  Surgeon: [name], MD;  Location: [facility] ENDOSCOPY;  Service: Endoscopy;  Laterality: N/A;  . ESOPHAGOGASTRODUODENOSCOPY (EGD) WITH PROPOFOL N/A 01/20/2016   Procedure: ESOPHAGOGASTRODUODENOSCOPY (EGD) WITH PROPOFOL;  Surgeon: [name], MD;  Location: [facility] ENDOSCOPY;  Service: Endoscopy;  Laterality: N/A;  . ESOPHAGOGASTRODUODENOSCOPY (EGD) WITH PROPOFOL N/A 04/17/2016   Procedure: ESOPHAGOGASTRODUODENOSCOPY (EGD) WITH PROPOFOL;  Surgeon: Lin Landsman, MD;  Location: [facility] ENDOSCOPY;  Service: Gastroenterology;  Laterality: N/A;  . ESOPHAGOGASTRODUODENOSCOPY (EGD) WITH PROPOFOL  05/09/2016   Procedure: ESOPHAGOGASTRODUODENOSCOPY (EGD) WITH PROPOFOL;  Surgeon: Jerene Bears, MD;  Location[facility] ENDOSCOPY;  Service: Endoscopy;;  . ESOPHAGOGASTRODUODENOSCOPY (EGD) WITH PROPOFOL N/A 06/16/2016   Procedure: ESOPHAGOGASTRODUODENOSCOPY (EGD) WITH PROPOFOL;  Surgeon: [name], MD;  Location: [facility] ENDOSCOPY;  Service: Endoscopy;  Laterality: N/A;  . GIVENS CAPSULE STUDY N/A 05/07/2016   Procedure: GIVENS CAPSULE STUDY;  Surgeon: Doran Stabler,  MD;  Location: [place];  Service: Endoscopy;  Laterality: N/A;  . MANDIBULAR HARDWARE REMOVAL N/A 07/29/2013   Procedure: REMOVAL OF ARCH BARS;  Surgeon: Theodoro Kos, DO;  Location: [place];  Service: Plastics;  Laterality: N/A;  . ORIF MANDIBULAR FRACTURE N/A 06/05/2013   Procedure: REPAIR OF MANDIBULAR FRACTURE x 2 with maxillo-mandibular fixation ;  Surgeon: Theodoro Kos, DO;  Location: [place];  Service: Plastics;  Laterality: N/A;  . PERIPHERAL ARTERIAL STENT GRAFT Left    Patient Active Problem List   Diagnosis Date Noted  . Uremia 03/08/2017  . Hemodialysis patient ([place]) 03/03/2017  . Weakness 02/28/2017  . Hypocalcemia 02/22/2017  . Shortness of breath 11/26/2016  . COPD (chronic obstructive pulmonary disease) ([place]) 10/30/2016  . COPD exacerbation ([place]) 10/29/2016  . Anemia   . Heme positive stool   . Ulceration of intestine   . Benign neoplasm of transverse colon   . Acute gastrointestinal hemorrhage   . Esophageal candidiasis ([place])   . Angiodysplasia of intestinal tract   . Acute respiratory failure with hypoxia ([place]) 07/03/2016  . GI bleeding 06/24/2016  . Rectal bleeding 06/14/2016  . Anemia of chronic disease 06/01/2016  . MRSA carrier 06/01/2016  . Chronic renal failure 05/23/2016  . Ischemic heart disease 05/23/2016  . Angiodysplasia of small intestine   . Melena   . Small bowel bleed not requiring more than 4 units of blood in 24 hours, ICU, or surgery   . Anemia due to chronic blood loss   . Abdominal pain 05/05/2016  . Acute posthemorrhagic anemia 04/17/2016  . Gastrointestinal bleed 04/17/2016  . History of esophagogastroduodenoscopy (EGD) 04/17/2016  . Elevated troponin 04/17/2016  . Alcohol abuse 04/17/2016  . Upper GI bleed 01/19/2016  . Blood in stool   . Angiodysplasia of stomach and duodenum with hemorrhage   . Gastritis   . Esophagitis, unspecified   . GI bleed 05/16/2015  . Acute GI bleeding   . Symptomatic anemia  04/30/2015  . HTN (hypertension) 04/06/2015  . GERD (gastroesophageal reflux disease) 04/06/2015  . HLD (hyperlipidemia) 04/06/2015  . Dyspnea 04/06/2015  . Cirrhosis of liver with ascites ([place]) 04/06/2015  . Ascites 04/06/2015  . GIB (gastrointestinal bleeding) 03/23/2015  . Homicidal ideation 06/19/2013  . Suicidal intent 06/19/2013  . Homicidal ideations 06/19/2013  . Hyperkalemia 06/16/2013  . Mandible fracture ([place]) 06/05/2013  . Fracture, mandible ([place]) 06/02/2013  . Coronary atherosclerosis of native coronary artery 06/02/2013  . ESRD on dialysis ([place]) 06/02/2013  . Mandible open fracture ([place]) 06/02/2013      Prior to Admission medications   Medication Sig Start Date End Date Taking? Authorizing Provider  albuterol (PROVENTIL HFA;VENTOLIN HFA) 108 (90 Base) MCG/ACT inhaler Inhale 2 puffs into the lungs every 6 (six) hours as needed for wheezing or shortness of breath. 02/19/17   Darel Hong, MD  alum & mag hydroxide-simeth (MAALOX/MYLANTA) 200-200-20 MG/5ML suspension Take 15 mLs every 4 (four) hours as needed by mouth for indigestion or heartburn. 01/25/17   Demetrios Loll, MD  aspirin 81 MG EC tablet Take 81 mg by mouth daily. 11/17/16   [provider]  atorvastatin (LIPITOR) 40 MG tablet Take 1 tablet (40 mg total) by mouth daily. 11/16/16   Vaughan Basta, MD  budesonide-formoterol (SYMBICORT) 160-4.5 MCG/ACT inhaler Inhale 2 puffs into the lungs daily. 11/16/16   Vaughan Basta, MD  ferrous sulfate 325 (65 FE) MG tablet Take 1 tablet by mouth 3 (three) times daily. 01/21/17   [provider]  furosemide (LASIX) 80 MG tablet Take 1 tablet (80 mg total) daily by mouth. 01/25/17   Demetrios Loll, MD  gabapentin (NEURONTIN) 300 MG capsule TAKE 1 CAPSULE BY MOUTH EVERY EVENING AND 1 EXTRA CAP ON DIALYSIS DAYS AS NEEDED FOR NERVE PAIN 08/16/16   [provider]  ipratropium-albuterol (DUONEB) 0.5-2.5 (3) MG/3ML SOLN Take 3 mLs by nebulization every  6 (six) hours as needed. 11/28/16   Loletha Grayer, MD  labetalol (NORMODYNE) 100 MG tablet Take 1 tablet (100 mg total) by mouth daily. 11/16/16   Vaughan Basta, MD  lactulose (CHRONULAC) 10 GM/15ML solution Take 30 mLs (20 g total) by mouth 3 (three) times daily. 01/11/17   Bettey Costa, MD  Multiple Vitamins-Minerals-FA (DIALYVITE SUPREME D) 3 MG TABS Take 1 tablet (3 mg total) by mouth daily. 11/28/16   Loletha Grayer, MD  nicotine (NICODERM CQ - DOSED IN MG/24 HOURS) 21 mg/24hr patch Place 1 patch (21 mg total) onto the skin daily. 01/12/17   Bettey Costa, MD  nitroGLYCERIN (NITROSTAT) 0.4 MG SL tablet Place 1 tablet (0.4 mg total) under the tongue every 5 (five) minutes as needed. 04/12/16   Wende Bushy, MD  pantoprazole (PROTONIX) 40 MG tablet Take 1 tablet (40 mg total) by mouth daily. 11/28/16   Loletha Grayer, MD  sevelamer carbonate (RENVELA) 800 MG tablet Take 3 tablets (2,400 mg total) by mouth 3 (three) times daily with meals. 11/16/16   Vaughan Basta, MD  Spacer/Aero Chamber Mouthpiece MISC 1 Units by Does not apply route every 4 (four) hours as needed (wheezing). 02/19/17   Darel Hong, MD  tiotropium (SPIRIVA HANDIHALER) 18 MCG inhalation capsule Place 1 capsule (18 mcg total) into inhaler and inhale daily. 11/16/16 11/16/17  Vaughan Basta, MD    Allergies Patient has no known allergies.    Social History Social History   Tobacco Use  . Smoking status: Current Every Day Smoker    Packs/day: 0.15    Years: 40.00    Pack years: 6.00    Types: Cigarettes  . Smokeless tobacco: Never Used  Substance Use Topics  . Alcohol use: No    Comment: pt reports quitting after learning about cirrhosis  . Drug use: No    Review of Systems Patient denies headaches, rhinorrhea, blurry vision, numbness, shortness of breath, chest pain, edema, cough, abdominal pain, nausea, vomiting, diarrhea, dysuria, fevers, rashes or hallucinations unless otherwise stated above  in HPI. ____________________________________________   PHYSICAL EXAM:  VITAL SIGNS: Vitals:   03/11/17 0908  BP: 114/71  Pulse: 80  Resp: 20  Temp: 98 F (36.7 C)  SpO2: 96%    Constitutional: Alert and oriented. Well appearing and in no acute distress. Eyes: Conjunctivae are normal.  Head: Atraumatic. Nose: No congestion/rhinnorhea. Mouth/Throat: Mucous membranes are moist.   Neck: No stridor. Painless ROM.  Cardiovascular: Normal rate, regular rhythm. Grossly normal heart sounds.  Good peripheral circulation. Respiratory: Normal respiratory effort.  No retractions. Lungs CTAB. Gastrointestinal: Soft and nontender. No distention. No abdominal bruits. No CVA tenderness. Genitourinary:  Musculoskeletal: No lower extremity tenderness nor edema.  No joint effusions. Neurologic:  Normal speech and language.  No gross focal neurologic deficits are appreciated. No facial droop Skin:  Skin is warm, dry and intact. No rash noted. Psychiatric: Mood and affect are normal. Speech and behavior are normal.  ____________________________________________   LABS (all labs ordered are listed, but only abnormal results are displayed)  Results for orders placed or performed during the hospital encounter of 03/11/17 (from the past 24 hour(s))  Basic metabolic panel     Status: Abnormal   Collection Time: 03/11/17  9:48 AM  Result Value Ref Range   Sodium 135 135 - 145 mmol/L   Potassium 4.8 3.5 - 5.1 mmol/L   Chloride 95 (L) 101 - 111 mmol/L   CO2 29 22 - 32 mmol/L   Glucose, Bld 82 65 - 99 mg/dL   BUN 37 (H) 6 - 20 mg/dL   Creatinine, Ser 8.25 (H) 0.61 - 1.24 mg/dL   Calcium 8.1 (L) 8.9 - 10.3 mg/dL   GFR calc non Af Amer 6 (L) >60 mL/min   GFR calc Af Amer 7 (L) >60 mL/min   Anion gap 11 5 - 15   ____________________________________________ ____________________________________________  RADIOLOGY  I personally reviewed all radiographic images ordered to evaluate for the above  acute complaints and reviewed radiology reports and findings.  These findings were personally discussed with the patient.  Please see medical record for radiology report.  ____________________________________________   PROCEDURES  Procedure(s) performed:  Procedures    Critical Care performed: no ____________________________________________   INITIAL IMPRESSION / ASSESSMENT AND PLAN / ED COURSE  Pertinent labs & imaging results that were available during my care of the patient were reviewed by me and considered in my medical decision making (see chart for details).  DDX: CKD, hyperkalemia, chf, volume overload  Capers Hagmann is a 56 y.o. who presents to the ED with need for dialysis.  Electrolyte are normal.  I spoke with Dr. [name] of nephrology who agrees to dialyze patient.  After his session patient will be stable and appropriate for discharge home.  Clinical Course as of Mar 11 1402  Sat Mar 11, 2017  1400 Patient becoming agitated with staff and unwilling to wait for dialysis which has been arranged here in the hospital for him.  Given his frequent firings from other dialysis centers agitation and aggressive towards staff this is not very surprising.  Patient was encouraged to stay was unwilling to wait.  Patient left AMA.  [PR]    Clinical Course User Index [PR] Merlyn Lot, MD     ____________________________________________   FINAL CLINICAL IMPRESSION(S) / ED DIAGNOSES  Final diagnoses:  ESRD on dialysis [facility])      NEW MEDICATIONS STARTED DURING THIS VISIT:  This SmartLink is deprecated. Use AVSMEDLIST instead to display the medication list for a patient.   Note:  This document was prepared using Dragon voice recognition software and may include unintentional dictation errors.    Merlyn Lot, MD 03/11/17 [phone]

## 2017-03-11 NOTE — ED Notes (Signed)
Called dialysis, dialysis says will be ready in 40 mins.

## 2017-03-11 NOTE — ED Triage Notes (Signed)
Patient to ER "to get dialysis". Patient states he was told to come to ER to check in for dialysis. Denies any problems.

## 2017-03-11 NOTE — Progress Notes (Signed)
[facility], [place] 03/11/17  Subjective:   Patient has been d/c from his usual unit He has come in to ER for evluation  K 4.8 today Evaluation for Dialysis requested by ER  Objective:  Vital signs in last 24 hours:  Temp:  [98 F (36.7 C)] 98 F (36.7 C) (12/29 0908) Pulse Rate:  [80] 80 (12/29 0908) Resp:  [20] 20 (12/29 0908) BP: (114)/(71) 114/71 (12/29 0908) SpO2:  [96 %] 96 % (12/29 0908) Weight:  [77.1 kg (170 lb)] 77.1 kg (170 lb) (12/29 0909)  Weight change:  Filed Weights   03/11/17 0909  Weight: 77.1 kg (170 lb)    Intake/Output:   No intake or output data in the 24 hours ending 03/11/17 1256   Physical Exam: General: No acute distress, laying in the bed  HEENT anicteric  Neck supple  Pulm/lungs Clear to auscultation  CVS/Heart Regular no rub  Abdomen:  distended  Extremities: 2+ edema  Neurologic: Alert, oriented  Skin: No acute rashes  Access: AV fistula       Basic Metabolic Panel:  Recent Labs  Lab 03/08/17 1638 03/09/17 0449 03/11/17 0948  NA 139 135 135  K 4.7 5.4* 4.8  CL 96* 96* 95*  CO2 31 26 29   GLUCOSE 104* 95 82  BUN 55* 57* 37*  CREATININE 10.72* 10.46* 8.25*  CALCIUM 8.6* 7.9* 8.1*     CBC: Recent Labs  Lab 03/08/17 1638 03/09/17 0449  WBC 5.4 5.7  HGB 9.9* 9.3*  HCT 31.2* 29.5*  MCV 86.0 85.6  PLT 273 309      Lab Results  Component Value Date   HEPBSAG Negative 03/01/2017   HEPBSAB Reactive 03/01/2017   HEPBIGM Negative 06/01/2016      Microbiology:  No results found for this or any previous visit (from the past 240 hour(s)).  Coagulation Studies: No results for input(s): LABPROT, INR in the last 72 hours.  Urinalysis: No results for input(s): COLORURINE, LABSPEC, PHURINE, GLUCOSEU, HGBUR, BILIRUBINUR, KETONESUR, PROTEINUR, UROBILINOGEN, NITRITE, LEUKOCYTESUR in the last 72 hours.  Invalid input(s): APPERANCEUR    Imaging: Dg Chest 2 View  Result Date:  03/11/2017 CLINICAL DATA:  "To get dialysis" EXAM: CHEST  2 VIEW COMPARISON:  03/08/2017 FINDINGS: Lungs are hyperexpanded. The lungs are clear without focal pneumonia, edema, pneumothorax or pleural effusion. Interstitial markings are diffusely coarsened with chronic features. Patient is status post CABG Cardiopericardial silhouette is at upper limits of normal for size. The visualized bony structures of the thorax are intact. IMPRESSION: No active cardiopulmonary disease. Electronically Signed   By: Misty Stanley M.D.   On: 03/11/2017 09:46     Medications:       Assessment/ Plan:  56 y.o. male  withend stage renal disease on hemodialysis secondary to Alport's syndrome, ascites, hypertension, anemia of chronic kidney disease, coronary artery disease, peripheral vascular disease, hyperlipidemia, gastrointestinal AVMs, congestive heart failure  [facility] AVF- no dialysis unit currently  1.  Uremia 2.  Ascites, LE edema 3.  End-stage renal disease 4.  Anemia of CKD 5.  Secondary hyperparathyroidism 6.  HTN  Plan: Dialysis to correct uremia and volume overload Volume removal target of 1.5-2 kg as tolerated  Hgb 9.3. Low dose EPO Unfortunately, patient does not have an outpatient unit to go to therefore patient will have to return to the emergency room for future dialysis     LOS: 0 Lakayla Barrington [name] 12/29/201812:56 PM  [facility],  [place] [phone]

## 2017-03-13 DIAGNOSIS — Z992 Dependence on renal dialysis: Secondary | ICD-10-CM | POA: Diagnosis not present

## 2017-03-13 DIAGNOSIS — N186 End stage renal disease: Secondary | ICD-10-CM | POA: Diagnosis not present

## 2017-03-14 ENCOUNTER — Emergency Department: Payer: Medicare Other

## 2017-03-14 ENCOUNTER — Encounter: Payer: Self-pay | Admitting: Intensive Care

## 2017-03-14 ENCOUNTER — Emergency Department
Admission: EM | Admit: 2017-03-14 | Discharge: 2017-03-14 | Disposition: A | Discharge status: Home / Self Care | Payer: Medicare Other | Attending: Emergency Medicine | Admitting: Emergency Medicine

## 2017-03-14 DIAGNOSIS — E1151 Type 2 diabetes mellitus with diabetic peripheral angiopathy without gangrene: Secondary | ICD-10-CM | POA: Diagnosis not present

## 2017-03-14 DIAGNOSIS — J449 Chronic obstructive pulmonary disease, unspecified: Secondary | ICD-10-CM | POA: Diagnosis not present

## 2017-03-14 DIAGNOSIS — M7989 Other specified soft tissue disorders: Secondary | ICD-10-CM | POA: Diagnosis not present

## 2017-03-14 DIAGNOSIS — Z79899 Other long term (current) drug therapy: Secondary | ICD-10-CM | POA: Diagnosis not present

## 2017-03-14 DIAGNOSIS — D631 Anemia in chronic kidney disease: Secondary | ICD-10-CM | POA: Diagnosis not present

## 2017-03-14 DIAGNOSIS — F17210 Nicotine dependence, cigarettes, uncomplicated: Secondary | ICD-10-CM | POA: Insufficient documentation

## 2017-03-14 DIAGNOSIS — Z992 Dependence on renal dialysis: Secondary | ICD-10-CM | POA: Insufficient documentation

## 2017-03-14 DIAGNOSIS — I25810 Atherosclerosis of coronary artery bypass graft(s) without angina pectoris: Secondary | ICD-10-CM | POA: Insufficient documentation

## 2017-03-14 DIAGNOSIS — E1122 Type 2 diabetes mellitus with diabetic chronic kidney disease: Secondary | ICD-10-CM | POA: Insufficient documentation

## 2017-03-14 DIAGNOSIS — N19 Unspecified kidney failure: Secondary | ICD-10-CM | POA: Diagnosis not present

## 2017-03-14 DIAGNOSIS — R188 Other ascites: Secondary | ICD-10-CM | POA: Diagnosis not present

## 2017-03-14 DIAGNOSIS — N186 End stage renal disease: Secondary | ICD-10-CM | POA: Diagnosis not present

## 2017-03-14 DIAGNOSIS — R0602 Shortness of breath: Secondary | ICD-10-CM

## 2017-03-14 DIAGNOSIS — I509 Heart failure, unspecified: Secondary | ICD-10-CM | POA: Diagnosis not present

## 2017-03-14 DIAGNOSIS — E8770 Fluid overload, unspecified: Secondary | ICD-10-CM

## 2017-03-14 DIAGNOSIS — Z7982 Long term (current) use of aspirin: Secondary | ICD-10-CM | POA: Insufficient documentation

## 2017-03-14 DIAGNOSIS — I132 Hypertensive heart and chronic kidney disease with heart failure and with stage 5 chronic kidney disease, or end stage renal disease: Secondary | ICD-10-CM | POA: Diagnosis not present

## 2017-03-14 DIAGNOSIS — R062 Wheezing: Secondary | ICD-10-CM | POA: Diagnosis not present

## 2017-03-14 LAB — CBC
HCT: 30.1 % — ABNORMAL LOW (ref 40.0–52.0)
Hemoglobin: 9.4 g/dL — ABNORMAL LOW (ref 13.0–18.0)
MCH: 26.9 pg (ref 26.0–34.0)
MCHC: 31.2 g/dL — AB (ref 32.0–36.0)
MCV: 86.2 fL (ref 80.0–100.0)
PLATELETS: 272 10*3/uL (ref 150–440)
RBC: 3.49 MIL/uL — ABNORMAL LOW (ref 4.40–5.90)
RDW: 20.1 % — AB (ref 11.5–14.5)
WBC: 5.1 10*3/uL (ref 3.8–10.6)

## 2017-03-14 LAB — BASIC METABOLIC PANEL
Anion gap: 13 (ref 5–15)
BUN: 64 mg/dL — AB (ref 6–20)
CO2: 28 mmol/L (ref 22–32)
CREATININE: 11.21 mg/dL — AB (ref 0.61–1.24)
Calcium: 8.3 mg/dL — ABNORMAL LOW (ref 8.9–10.3)
Chloride: 97 mmol/L — ABNORMAL LOW (ref 101–111)
GFR calc Af Amer: 5 mL/min — ABNORMAL LOW (ref >60)
GFR, EST NON AFRICAN AMERICAN: 4 mL/min — AB (ref >60)
GLUCOSE: 87 mg/dL (ref 65–99)
Potassium: 5.2 mmol/L — ABNORMAL HIGH (ref 3.5–5.1)
SODIUM: 138 mmol/L (ref 135–145)

## 2017-03-14 LAB — TROPONIN I: Troponin I: 0.04 ng/mL (ref <0.03)

## 2017-03-14 MED ORDER — NEPRO/CARBSTEADY PO LIQD: 237.0000 mL | ORAL | Status: DC | PRN

## 2017-03-14 MED ORDER — EPOETIN ALFA 4000 UNIT/ML IJ SOLN
4000.0000 [IU] | Freq: Once | INTRAMUSCULAR | Status: AC
  Administered 2017-03-14: 4000 [IU] via INTRAVENOUS
  Filled 2017-03-14: qty 1

## 2017-03-14 MED ORDER — SODIUM CHLORIDE 0.9 % IV SOLN: 100.0000 mL | INTRAVENOUS | Status: DC | PRN

## 2017-03-14 MED ORDER — IPRATROPIUM-ALBUTEROL 0.5-2.5 (3) MG/3ML IN SOLN
3.0000 mL | Freq: Once | RESPIRATORY_TRACT | Status: AC
  Administered 2017-03-14: 3 mL via RESPIRATORY_TRACT
  Filled 2017-03-14: qty 3

## 2017-03-14 MED ORDER — LIDOCAINE-PRILOCAINE 2.5-2.5 % EX CREA: 1.0000 "application " | TOPICAL_CREAM | CUTANEOUS | Status: DC | PRN

## 2017-03-14 MED ORDER — HEPARIN SODIUM (PORCINE) 1000 UNIT/ML DIALYSIS
1000.0000 [IU] | INTRAMUSCULAR | Status: DC | PRN
  Filled 2017-03-14: qty 1

## 2017-03-14 MED ORDER — LIDOCAINE HCL (PF) 1 % IJ SOLN: 5.0000 mL | INTRAMUSCULAR | Status: DC | PRN

## 2017-03-14 NOTE — ED Notes (Signed)
Patient given juice and food per request

## 2017-03-14 NOTE — Progress Notes (Signed)
Post HD assessment unchanged  

## 2017-03-14 NOTE — ED Triage Notes (Addendum)
Patient arrived from home by EMS for SOB. EMS administered 2 duonebs. Dialysis patient with access in R arm. Last treatment was on Thursday. Patient reports he did not get treatment on Saturday because he did not have a ride. Patient called yesterday to [facility] to get dialyzed and states they told him not to come because they were to busy. He started having SOB today and reports he also is here to be dialyzed and have paracentesis. Abdomen is distended. A&O x4 upon arrival to ER

## 2017-03-14 NOTE — ED Provider Notes (Signed)
[facility] Emergency Department Provider Note  ____________________________________________  Time seen: Approximately 12:21 PM  I have reviewed the triage vital signs and the nursing notes.   HISTORY  Chief Complaint Shortness of Breath   HPI Stanley [name] is a 57 y.o. male with h/o ESRD on HD (TTS), CAD, alcohol abusewho presents for shortness of breath. Patient reports that he missed his dialysis on Saturday. He came to the hospital and had to wait an hour and a half and his ride had to leave. His last dialysis was 5 days ago. He is complaining of progressively worsening shortness of breath and lower extremity swelling. His shortness of breath is constant and moderate at this time. SOB is worse when he lies flat or exertion. He also has been wheezing and has a history of COPD. No fever or chills, no CP, no nausea or vomiting, no diarrhea. Patient reports that he usually gets paracentesis once a week for questionable history of cirrhosis however he recently underwent a biopsy of his liver at [facility] which showed no evidence of cirrhosis. He reports that his last paracentesis was 3 weeks ago. He is also complaining of abdominal distention but no pain. Patient reports that he comes to the hospital for dialysis.  Past Medical History:  Diagnosis Date  . Alcohol abuse   . CHF (congestive heart failure) ([place])   . Cirrhosis ([place])   . Coronary artery disease 2009  . Diabetic peripheral neuropathy ([place])   . Drug abuse ([place])   . End stage renal disease on dialysis [facility]) NEPHROLOGIST-   DR [facility]  IN    HEMODIALYSIS --   TUES/  THURS/  SAT  . GERD (gastroesophageal reflux disease)   . Hyperlipidemia   . Hypertension   . PAD (peripheral artery disease) ([place])   . Renal insufficiency    Per pt, 32 oz fluid restriction per day  . S/P triple vessel bypass 06/09/2016   2009ish  . Suicidal ideation    & HOMICIDAL IDEATION --  06-16-2013   ADMITTED TO  BEHAVIOR HEALTH    Patient Active Problem List   Diagnosis Date Noted  . Uremia 03/08/2017  . Hemodialysis patient ([address]) 03/03/2017  . Weakness 02/28/2017  . Hypocalcemia 02/22/2017  . Shortness of breath 11/26/2016  . COPD (chronic obstructive pulmonary disease) ([place]) 10/30/2016  . COPD exacerbation ([place]) 10/29/2016  . Anemia   . Heme positive stool   . Ulceration of intestine   . Benign neoplasm of transverse colon   . Acute gastrointestinal hemorrhage   . Esophageal candidiasis ([place])   . Angiodysplasia of intestinal tract   . Acute respiratory failure with hypoxia ([place]) 07/03/2016  . GI bleeding 06/24/2016  . Rectal bleeding 06/14/2016  . Anemia of chronic disease 06/01/2016  . MRSA carrier 06/01/2016  . Chronic renal failure 05/23/2016  . Ischemic heart disease 05/23/2016  . Angiodysplasia of small intestine   . Melena   . Small bowel bleed not requiring more than 4 units of blood in 24 hours, ICU, or surgery   . Anemia due to chronic blood loss   . Abdominal pain 05/05/2016  . Acute posthemorrhagic anemia 04/17/2016  . Gastrointestinal bleed 04/17/2016  . History of esophagogastroduodenoscopy (EGD) 04/17/2016  . Elevated troponin 04/17/2016  . Alcohol abuse 04/17/2016  . Upper GI bleed 01/19/2016  . Blood in stool   . Angiodysplasia of stomach and duodenum with hemorrhage   . Gastritis   . Esophagitis, unspecified   .  GI bleed 05/16/2015  . Acute GI bleeding   . Symptomatic anemia 04/30/2015  . HTN (hypertension) 04/06/2015  . GERD (gastroesophageal reflux disease) 04/06/2015  . HLD (hyperlipidemia) 04/06/2015  . Dyspnea 04/06/2015  . Cirrhosis of liver with ascites ([place]) 04/06/2015  . Ascites 04/06/2015  . GIB (gastrointestinal bleeding) 03/23/2015  . Homicidal ideation 06/19/2013  . Suicidal intent 06/19/2013  . Homicidal ideations 06/19/2013  . Hyperkalemia 06/16/2013  . Mandible fracture ([place]) 06/05/2013  . Fracture, mandible ([place]) 06/02/2013  .  Coronary atherosclerosis of native coronary artery 06/02/2013  . ESRD on dialysis ([place]) 06/02/2013  . Mandible open fracture ([place]) 06/02/2013    Past Surgical History:  Procedure Laterality Date  . AGILE CAPSULE N/A 06/19/2016   Procedure: AGILE CAPSULE;  Surgeon: [name], MD;  Location: [facility] ENDOSCOPY;  Service: Endoscopy;  Laterality: N/A;  . COLONOSCOPY WITH PROPOFOL N/A 06/18/2016   Procedure: COLONOSCOPY WITH PROPOFOL;  Surgeon: [name], MD;  Location: [facility] ENDOSCOPY;  Service: Endoscopy;  Laterality: N/A;  . COLONOSCOPY WITH PROPOFOL N/A 08/12/2016   Procedure: COLONOSCOPY WITH PROPOFOL;  Surgeon: [name], MD;  Location: [facility] ENDOSCOPY;  Service: Endoscopy;  Laterality: N/A;  . CORONARY ANGIOPLASTY  ?   PT UNABLE TO TELL IF  BEFORE OR AFTER  CABG  . CORONARY ARTERY BYPASS GRAFT  2008  ([place])   3 VESSEL  . DIALYSIS FISTULA CREATION  LAST SURGERY  APPOX  2008  . ENTEROSCOPY N/A 05/10/2016   Procedure: ENTEROSCOPY;  Surgeon: Jerene Bears, MD;  Location: [place];  Service: Gastroenterology;  Laterality: N/A;  . ENTEROSCOPY N/A 08/12/2016   Procedure: ENTEROSCOPY;  Surgeon: [name], MD;  Location: [facility] ENDOSCOPY;  Service: Endoscopy;  Laterality: N/A;  . ESOPHAGOGASTRODUODENOSCOPY N/A 05/07/2015   Procedure: ESOPHAGOGASTRODUODENOSCOPY (EGD);  Surgeon: Hulen Luster, MD;  Location: [facility] ENDOSCOPY;  Service: Endoscopy;  Laterality: N/A;  . ESOPHAGOGASTRODUODENOSCOPY (EGD) WITH PROPOFOL N/A 05/17/2015   Procedure: ESOPHAGOGASTRODUODENOSCOPY (EGD) WITH PROPOFOL;  Surgeon: [name], MD;  Location: [facility] ENDOSCOPY;  Service: Endoscopy;  Laterality: N/A;  . ESOPHAGOGASTRODUODENOSCOPY (EGD) WITH PROPOFOL N/A 01/20/2016   Procedure: ESOPHAGOGASTRODUODENOSCOPY (EGD) WITH PROPOFOL;  Surgeon: [name], MD;  Location: [facility] ENDOSCOPY;  Service: Endoscopy;  Laterality: N/A;  . ESOPHAGOGASTRODUODENOSCOPY (EGD) WITH PROPOFOL N/A 04/17/2016   Procedure: ESOPHAGOGASTRODUODENOSCOPY (EGD)  WITH PROPOFOL;  Surgeon: Lin Landsman, MD;  Location: [facility] ENDOSCOPY;  Service: Gastroenterology;  Laterality: N/A;  . ESOPHAGOGASTRODUODENOSCOPY (EGD) WITH PROPOFOL  05/09/2016   Procedure: ESOPHAGOGASTRODUODENOSCOPY (EGD) WITH PROPOFOL;  Surgeon: Jerene Bears, MD;  Location: [place];  Service: Endoscopy;;  . ESOPHAGOGASTRODUODENOSCOPY (EGD) WITH PROPOFOL N/A 06/16/2016   Procedure: ESOPHAGOGASTRODUODENOSCOPY (EGD) WITH PROPOFOL;  Surgeon: [name], MD;  Location: [facility] ENDOSCOPY;  Service: Endoscopy;  Laterality: N/A;  . GIVENS CAPSULE STUDY N/A 05/07/2016   Procedure: GIVENS CAPSULE STUDY;  Surgeon: Doran Stabler, MD;  Location: [place];  Service: Endoscopy;  Laterality: N/A;  . MANDIBULAR HARDWARE REMOVAL N/A 07/29/2013   Procedure: REMOVAL OF ARCH BARS;  Surgeon: Theodoro Kos, DO;  Location: [place];  Service: Plastics;  Laterality: N/A;  . ORIF MANDIBULAR FRACTURE N/A 06/05/2013   Procedure: REPAIR OF MANDIBULAR FRACTURE x 2 with maxillo-mandibular fixation ;  Surgeon: Theodoro Kos, DO;  Location: [place];  Service: Plastics;  Laterality: N/A;  . PERIPHERAL ARTERIAL STENT GRAFT Left     Prior to Admission medications   Medication Sig Start Date End Date Taking? Authorizing Provider  albuterol (PROVENTIL HFA;VENTOLIN HFA)  108 (90 Base) MCG/ACT inhaler Inhale 2 puffs into the lungs every 6 (six) hours as needed for wheezing or shortness of breath. 02/19/17   Darel Hong, MD  alum & mag hydroxide-simeth (MAALOX/MYLANTA) 200-200-20 MG/5ML suspension Take 15 mLs every 4 (four) hours as needed by mouth for indigestion or heartburn. 01/25/17   Demetrios Loll, MD  aspirin 81 MG EC tablet Take 81 mg by mouth daily. 11/17/16   [provider]  atorvastatin (LIPITOR) 40 MG tablet Take 1 tablet (40 mg total) by mouth daily. 11/16/16   Vaughan Basta, MD  budesonide-formoterol (SYMBICORT) 160-4.5 MCG/ACT inhaler Inhale 2 puffs into the lungs daily. 11/16/16    Vaughan Basta, MD  ferrous sulfate 325 (65 FE) MG tablet Take 1 tablet by mouth 3 (three) times daily. 01/21/17   [provider]  furosemide (LASIX) 80 MG tablet Take 1 tablet (80 mg total) daily by mouth. 01/25/17   Demetrios Loll, MD  gabapentin (NEURONTIN) 300 MG capsule TAKE 1 CAPSULE BY MOUTH EVERY EVENING AND 1 EXTRA CAP ON DIALYSIS DAYS AS NEEDED FOR NERVE PAIN 08/16/16   [provider]  ipratropium-albuterol (DUONEB) 0.5-2.5 (3) MG/3ML SOLN Take 3 mLs by nebulization every 6 (six) hours as needed. 11/28/16   Loletha Grayer, MD  labetalol (NORMODYNE) 100 MG tablet Take 1 tablet (100 mg total) by mouth daily. 11/16/16   Vaughan Basta, MD  lactulose (CHRONULAC) 10 GM/15ML solution Take 30 mLs (20 g total) by mouth 3 (three) times daily. 01/11/17   Bettey Costa, MD  Multiple Vitamins-Minerals-FA (DIALYVITE SUPREME D) 3 MG TABS Take 1 tablet (3 mg total) by mouth daily. 11/28/16   Loletha Grayer, MD  nicotine (NICODERM CQ - DOSED IN MG/24 HOURS) 21 mg/24hr patch Place 1 patch (21 mg total) onto the skin daily. 01/12/17   Bettey Costa, MD  nitroGLYCERIN (NITROSTAT) 0.4 MG SL tablet Place 1 tablet (0.4 mg total) under the tongue every 5 (five) minutes as needed. 04/12/16   Wende Bushy, MD  pantoprazole (PROTONIX) 40 MG tablet Take 1 tablet (40 mg total) by mouth daily. 11/28/16   Loletha Grayer, MD  sevelamer carbonate (RENVELA) 800 MG tablet Take 3 tablets (2,400 mg total) by mouth 3 (three) times daily with meals. 11/16/16   Vaughan Basta, MD  Spacer/Aero Chamber Mouthpiece MISC 1 Units by Does not apply route every 4 (four) hours as needed (wheezing). 02/19/17   Darel Hong, MD  tiotropium (SPIRIVA HANDIHALER) 18 MCG inhalation capsule Place 1 capsule (18 mcg total) into inhaler and inhale daily. 11/16/16 11/16/17  Vaughan Basta, MD    Allergies Patient has no known allergies.  Family History  Problem Relation Age of Onset  . Colon cancer Mother    . Cancer Father   . Cancer Sister   . Kidney disease Brother     Social History Social History   Tobacco Use  . Smoking status: Current Every Day Smoker    Packs/day: 0.15    Years: 40.00    Pack years: 6.00    Types: Cigarettes  . Smokeless tobacco: Never Used  Substance Use Topics  . Alcohol use: No    Comment: pt reports quitting after learning about cirrhosis  . Drug use: No    Review of Systems  Constitutional: Negative for fever. Eyes: Negative for visual changes. ENT: Negative for sore throat. Neck: No neck pain  Cardiovascular: Negative for chest pain. Respiratory: + shortness of breath and wheezing Gastrointestinal: Negative for abdominal pain, vomiting or diarrhea. + abdominal  distention Genitourinary: Negative for dysuria. Musculoskeletal: Negative for back pain. + b/l LE edemea Skin: Negative for rash. Neurological: Negative for headaches, weakness or numbness. Psych: No SI or HI  ____________________________________________   PHYSICAL EXAM:  VITAL SIGNS: ED Triage Vitals  Enc Vitals Group     BP 03/14/17 1107 (!) 140/99     Pulse Rate 03/14/17 1107 82     Resp 03/14/17 1107 16     Temp 03/14/17 1107 97.9 F (36.6 C)     Temp Source 03/14/17 1107 Oral     SpO2 03/14/17 1107 99 %     Weight 03/14/17 1108 170 lb (77.1 kg)     Height 03/14/17 1108 6\' 3"  (1.905 m)     Head Circumference --      Peak Flow --      Pain Score 03/14/17 1106 8     Pain Loc --      Pain Edu? --      Excl. in [place]? --     Constitutional: Alert and oriented. Well appearing and in no apparent distress. HEENT:      Head: Normocephalic and atraumatic.         Eyes: Conjunctivae are normal. Sclera is non-icteric.       Mouth/Throat: Mucous membranes are moist.       Neck: Supple with no signs of meningismus. Cardiovascular: Regular rate and rhythm. No murmurs, gallops, or rubs. 2+ symmetrical distal pulses are present in all extremities. No JVD. Respiratory: normal work  of breathing, normal sats, decreased air movement with diffuse expiratory wheezes Gastrointestinal: Soft, non tender, distended with positive bowel sounds. No rebound or guarding. Genitourinary: No CVA tenderness. Musculoskeletal: 1+ pitting edema left greater than right Neurologic: Normal speech and language. Face is symmetric. Moving all extremities. No gross focal neurologic deficits are appreciated. Skin: Skin is warm, dry and intact. No rash noted. Psychiatric: Mood and affect are normal. Speech and behavior are normal.  ____________________________________________   LABS (all labs ordered are listed, but only abnormal results are displayed)  Labs Reviewed  BASIC METABOLIC PANEL - Abnormal; Notable for the following components:      Result Value   Potassium 5.2 (*)    Chloride 97 (*)    BUN 64 (*)    Creatinine, Ser 11.21 (*)    Calcium 8.3 (*)    GFR calc non Af Amer 4 (*)    GFR calc Af Amer 5 (*)    All other components within normal limits  CBC - Abnormal; Notable for the following components:   RBC 3.49 (*)    Hemoglobin 9.4 (*)    HCT 30.1 (*)    MCHC 31.2 (*)    RDW 20.1 (*)    All other components within normal limits  TROPONIN I - Abnormal; Notable for the following components:   Troponin I 0.04 (*)    All other components within normal limits   ____________________________________________  EKG  ED ECG REPORT I, Rudene Re, the attending physician, personally viewed and interpreted this ECG.  Normal sinus rhythm, rate of 80, prolonged QTC, normal axis, T-wave inversions in inferior leads, no ST elevations or depressions, no peaked T waves. Unchanged from prior from 02/28/17 ____________________________________________  RADIOLOGY  CXR: Similar appearance of chronic lung changes without evidence of superimposed acute cardiopulmonary disease.  Cardiomegaly with surgical changes of prior median sternotomy and CABG.    ____________________________________________   PROCEDURES  Procedure(s) performed: None Procedures Critical Care performed:  None  ____________________________________________   INITIAL IMPRESSION / ASSESSMENT AND PLAN / ED COURSE  57 y.o. male with h/o ESRD on HD (TTS), CAD, alcohol abusewho presents for shortness of breath in the setting of missed HD. Patient has pitting edema and wheezing on exam. Presentation concerning for volume overload and COPD exacerbation. Normal WOB, normal sats. EKG unchanged from baseline. Labs showing hyperK 5.2. CXR with cardiomegaly but no PNA or pulmonary edema. Will give duonebs and consult nephrology for HD.  Clinical Course as of Mar 15 1727  Tue Mar 14, 2017  1230 Discussed with Dr. [name] who will dialyze patient out of the ED. Will reassess after HD and if patient is still wheezing will treat for COPD with steroids.  [CV]    Clinical Course User Index [CV] Alfred Levins [place], MD   _________________________ 5:28 PM on 03/14/2017 -----------------------------------------  patient back from dialysis. No longer wheezing, normal work of breathing, normal sats, no longer complaining of any shortness of breath. vital signs are stable. Patient's can be discharged home with follow-up with his nephrologist. Discussed return precautions  As part of my medical decision making, I reviewed the following data within the [place] notes reviewed and incorporated, Labs reviewed , EKG interpreted , Old EKG reviewed, Radiograph reviewed , A consult was requested and obtained from this/these consultant(s) nephrology, Notes from prior ED visits and [place] Controlled Substance Database    Pertinent labs & imaging results that were available during my care of the patient were reviewed by me and considered in my medical decision making (see chart for details).    ____________________________________________   FINAL CLINICAL IMPRESSION(S) /  ED DIAGNOSES  Final diagnoses:  Shortness of breath  Hypervolemia, unspecified hypervolemia type      NEW MEDICATIONS STARTED DURING THIS VISIT:  ED Discharge Orders    None       Note:  This document was prepared using Dragon voice recognition software and may include unintentional dictation errors.    Alfred Levins, [place], MD 03/14/17 [phone]

## 2017-03-14 NOTE — Progress Notes (Addendum)
1320- Pre hd assessment. Patient in no distress. Ambulatory. Lungs clear/diminished bilaterally. HD initiated via R AVF at 1325 without issue. 3.5L goal. Continue to monitor.Marland Kitchen

## 2017-03-14 NOTE — Progress Notes (Signed)
Pre hd 

## 2017-03-14 NOTE — ED Notes (Signed)
Patient back from dialysis. Dialysis RN reports pulling 4L off patient and dialysis went well.

## 2017-03-14 NOTE — Progress Notes (Signed)
[facility], [place] 03/14/17  Subjective:   Patient has been d/c from his usual unit He has come in to ER for evluation  K 5.2 today patient is volume overload.  He has significant edema Evaluation for Dialysis requested by ER   Patient appears volume overload.  He has significant lower extremity edema.  He is also short of breath today. Chest x-ray shows chronic lung changes, cardiomegaly  Objective:  Vital signs in last 24 hours:  Temp:  [97.9 F (36.6 C)] 97.9 F (36.6 C) (01/01 1107) Pulse Rate:  [82] 82 (01/01 1107) Resp:  [16] 16 (01/01 1107) BP: (140)/(99) 140/99 (01/01 1107) SpO2:  [99 %] 99 % (01/01 1107) Weight:  [77.1 kg (170 lb)] 77.1 kg (170 lb) (01/01 1108)  Weight change:  Filed Weights   03/14/17 1108  Weight: 77.1 kg (170 lb)    Intake/Output:   No intake or output data in the 24 hours ending 03/14/17 1246   Physical Exam: General: No acute distress, laying in the bed  HEENT anicteric  Neck supple  Pulm/lungs  bilateral crackles and wheezes   CVS/Heart Regular no rub  Abdomen:  distended  Extremities: 2+ edema  Neurologic: Alert, oriented  Skin: No acute rashes  Access: AV fistula       Basic Metabolic Panel:  Recent Labs  Lab 03/08/17 1638 03/09/17 0449 03/11/17 0948 03/14/17 1115  NA 139 135 135 138  K 4.7 5.4* 4.8 5.2*  CL 96* 96* 95* 97*  CO2 31 26 29 28   GLUCOSE 104* 95 82 87  BUN 55* 57* 37* 64*  CREATININE 10.72* 10.46* 8.25* 11.21*  CALCIUM 8.6* 7.9* 8.1* 8.3*     CBC: Recent Labs  Lab 03/08/17 1638 03/09/17 0449 03/14/17 1115  WBC 5.4 5.7 5.1  HGB 9.9* 9.3* 9.4*  HCT 31.2* 29.5* 30.1*  MCV 86.0 85.6 86.2  PLT 273 309 272      Lab Results  Component Value Date   HEPBSAG Negative 03/01/2017   HEPBSAB Reactive 03/01/2017   HEPBIGM Negative 06/01/2016      Microbiology:  No results found for this or any previous visit (from the past 240 hour(s)).  Coagulation Studies: No  results for input(s): LABPROT, INR in the last 72 hours.  Urinalysis: No results for input(s): COLORURINE, LABSPEC, PHURINE, GLUCOSEU, HGBUR, BILIRUBINUR, KETONESUR, PROTEINUR, UROBILINOGEN, NITRITE, LEUKOCYTESUR in the last 72 hours.  Invalid input(s): APPERANCEUR    Imaging: Dg Chest 2 View  Result Date: 03/14/2017 CLINICAL DATA:  57 year old male with a history of shortness of breath for 2-3 days EXAM: CHEST  2 VIEW COMPARISON:  03/11/2017, 03/08/2017 FINDINGS: Cardiomediastinal silhouette unchanged with cardiomegaly. Surgical changes of prior median sternotomy and CABG. No evidence of central vascular congestion. Similar appearance of coarsened interstitial markings of the bilateral lungs. No pleural effusion. No confluent airspace disease. No pneumothorax. IMPRESSION: Similar appearance of chronic lung changes without evidence of superimposed acute cardiopulmonary disease. Cardiomegaly with surgical changes of prior median sternotomy and CABG. Electronically Signed   By: Corrie Mckusick D.O.   On: 03/14/2017 11:58     Medications:       Assessment/ Plan:  57 y.o. male  withend stage renal disease on hemodialysis secondary to Alport's syndrome, ascites, hypertension, anemia of chronic kidney disease, coronary artery disease, peripheral vascular disease, hyperlipidemia, gastrointestinal AVMs, congestive heart failure  [facility] [place] AVF- no dialysis unit currently  1.  Uremia with volume overload  2.  Ascites, LE  edema 3.  End-stage renal disease 4.  Anemia of CKD 5.  Secondary hyperparathyroidism 6.  HTN 7.  Transjugular liver biopsy at [facility] by interventional radiology on December 20. Report-mild to moderate centrilobular congestion consistent with mild congestive hepatopathy.  No portal fibrosis identified.  Plan: Urgent Dialysis to correct uremia and volume overload Volume removal target of 3.5-4 kg as tolerated  Hgb 9.4. Low dose EPO Unfortunately, patient does not  have an outpatient unit to go to therefore patient will have to return to the emergency room for future dialysis.      LOS: 0 Stanley [name] Stanley [name] 1/1/201912:46 PM  [facility] [place], [place]

## 2017-03-14 NOTE — ED Notes (Signed)
Patient taken down to dialysis.

## 2017-03-14 NOTE — Progress Notes (Signed)
HD completed without issue. 4L goal met. Patient tolerated well. Vitals stable post treatment. Report given to ED. Patient to be returned to ED room.

## 2017-03-17 ENCOUNTER — Other Ambulatory Visit: Payer: Self-pay

## 2017-03-17 ENCOUNTER — Emergency Department
Admission: EM | Admit: 2017-03-17 | Discharge: 2017-03-17 | Disposition: A | Discharge status: Home / Self Care | Payer: Medicare Other | Attending: Emergency Medicine | Admitting: Emergency Medicine

## 2017-03-17 DIAGNOSIS — Z9115 Patient's noncompliance with renal dialysis: Secondary | ICD-10-CM | POA: Insufficient documentation

## 2017-03-17 DIAGNOSIS — I110 Hypertensive heart disease with heart failure: Secondary | ICD-10-CM | POA: Insufficient documentation

## 2017-03-17 DIAGNOSIS — I2510 Atherosclerotic heart disease of native coronary artery without angina pectoris: Secondary | ICD-10-CM | POA: Insufficient documentation

## 2017-03-17 DIAGNOSIS — E1122 Type 2 diabetes mellitus with diabetic chronic kidney disease: Secondary | ICD-10-CM | POA: Diagnosis not present

## 2017-03-17 DIAGNOSIS — F17210 Nicotine dependence, cigarettes, uncomplicated: Secondary | ICD-10-CM | POA: Diagnosis not present

## 2017-03-17 DIAGNOSIS — I509 Heart failure, unspecified: Secondary | ICD-10-CM | POA: Insufficient documentation

## 2017-03-17 DIAGNOSIS — J449 Chronic obstructive pulmonary disease, unspecified: Secondary | ICD-10-CM | POA: Diagnosis not present

## 2017-03-17 DIAGNOSIS — Z992 Dependence on renal dialysis: Secondary | ICD-10-CM | POA: Diagnosis not present

## 2017-03-17 DIAGNOSIS — Z79899 Other long term (current) drug therapy: Secondary | ICD-10-CM | POA: Diagnosis not present

## 2017-03-17 DIAGNOSIS — N186 End stage renal disease: Secondary | ICD-10-CM | POA: Insufficient documentation

## 2017-03-17 DIAGNOSIS — R2243 Localized swelling, mass and lump, lower limb, bilateral: Secondary | ICD-10-CM | POA: Insufficient documentation

## 2017-03-17 DIAGNOSIS — R0602 Shortness of breath: Secondary | ICD-10-CM | POA: Insufficient documentation

## 2017-03-17 DIAGNOSIS — I120 Hypertensive chronic kidney disease with stage 5 chronic kidney disease or end stage renal disease: Secondary | ICD-10-CM | POA: Diagnosis not present

## 2017-03-17 LAB — RENAL FUNCTION PANEL
ANION GAP: 15 (ref 5–15)
Albumin: 2.9 g/dL — ABNORMAL LOW (ref 3.5–5.0)
BUN: 47 mg/dL — ABNORMAL HIGH (ref 6–20)
CALCIUM: 8.9 mg/dL (ref 8.9–10.3)
CHLORIDE: 92 mmol/L — AB (ref 101–111)
CO2: 34 mmol/L — AB (ref 22–32)
Creatinine, Ser: 10.5 mg/dL — ABNORMAL HIGH (ref 0.61–1.24)
GFR calc non Af Amer: 5 mL/min — ABNORMAL LOW (ref >60)
GFR, EST AFRICAN AMERICAN: 6 mL/min — AB (ref >60)
Glucose, Bld: 93 mg/dL (ref 65–99)
POTASSIUM: 4.9 mmol/L (ref 3.5–5.1)
Phosphorus: 6.7 mg/dL — ABNORMAL HIGH (ref 2.5–4.6)
Sodium: 141 mmol/L (ref 135–145)

## 2017-03-17 LAB — CBC
HEMATOCRIT: 30.5 % — AB (ref 40.0–52.0)
HEMOGLOBIN: 9.6 g/dL — AB (ref 13.0–18.0)
MCH: 26.8 pg (ref 26.0–34.0)
MCHC: 31.5 g/dL — ABNORMAL LOW (ref 32.0–36.0)
MCV: 85.1 fL (ref 80.0–100.0)
Platelets: 305 10*3/uL (ref 150–440)
RBC: 3.59 MIL/uL — AB (ref 4.40–5.90)
RDW: 19.4 % — ABNORMAL HIGH (ref 11.5–14.5)
WBC: 8.2 10*3/uL (ref 3.8–10.6)

## 2017-03-17 MED ORDER — LIDOCAINE-PRILOCAINE 2.5-2.5 % EX CREA
1.0000 "application " | TOPICAL_CREAM | CUTANEOUS | Status: DC | PRN
  Filled 2017-03-17: qty 5

## 2017-03-17 MED ORDER — HEPARIN SODIUM (PORCINE) 1000 UNIT/ML DIALYSIS
1000.0000 [IU] | INTRAMUSCULAR | Status: DC | PRN
  Filled 2017-03-17: qty 1

## 2017-03-17 MED ORDER — ALTEPLASE 2 MG IJ SOLR
2.0000 mg | Freq: Once | INTRAMUSCULAR | Status: DC | PRN
  Filled 2017-03-17: qty 2

## 2017-03-17 MED ORDER — SODIUM CHLORIDE 0.9 % IV SOLN: 100.0000 mL | INTRAVENOUS | Status: DC | PRN

## 2017-03-17 MED ORDER — LIDOCAINE HCL (PF) 1 % IJ SOLN
5.0000 mL | INTRAMUSCULAR | Status: DC | PRN
  Filled 2017-03-17: qty 5

## 2017-03-17 MED ORDER — PENTAFLUOROPROP-TETRAFLUOROETH EX AERO
1.0000 "application " | INHALATION_SPRAY | CUTANEOUS | Status: DC | PRN
  Filled 2017-03-17: qty 30

## 2017-03-17 NOTE — ED Triage Notes (Signed)
Pt arrives to ED via POV requesting dialysis treatment. Pt states "I cussed out the people at the last dialysis center and now I can't go back." Pt states this is his only option for dialysis treatment and states he is in the process of trying to find another dialysis treatment center. Dr Corky Downs at bedside at this time.

## 2017-03-17 NOTE — ED Notes (Signed)
Pt transported to dialysis at this time by ally, RN

## 2017-03-17 NOTE — Progress Notes (Signed)
HD tx Start

## 2017-03-17 NOTE — ED Notes (Signed)
Pt here for dialysis, states he has been " kicked out " of all other dialysis centers and comes here now for dialysis. MD at bedside

## 2017-03-17 NOTE — Progress Notes (Signed)
Tx stopped for machine alarms, Air Detector Alarm, would not clear, machine pulled MD notified and new set initiated. Pt stable.

## 2017-03-17 NOTE — ED Provider Notes (Signed)
[facility] Emergency Department Provider Note   ____________________________________________    I have reviewed the triage vital signs and the nursing notes.   HISTORY  Chief Complaint needs dialysis     HPI Stanley [name] is a 57 y.o. male who presents to the ED because he feels that he needs dialysis.  Patient has apparently been kicked out of all of the local dialysis centers and hence presents to the ED whenever he feels that he needs dialysis.  Last seen in the ED on the first and did receive dialysis at that time.  He reports mild shortness of breath, minimal lower extremity swelling.  No fevers or chills.  No chest pain.  No abdominal pain.  Past Medical History:  Diagnosis Date  . Alcohol abuse   . CHF (congestive heart failure) ([place])   . Cirrhosis ([place])   . Coronary artery disease 2009  . Diabetic peripheral neuropathy ([place])   . Drug abuse ([place])   . End stage renal disease on dialysis [facility] NEPHROLOGIST-   DR [facility]  IN [place]   HEMODIALYSIS --   TUES/  THURS/  SAT  . GERD (gastroesophageal reflux disease)   . Hyperlipidemia   . Hypertension   . PAD (peripheral artery disease) ([place])   . Renal insufficiency    Per pt, 32 oz fluid restriction per day  . S/P triple vessel bypass 06/09/2016   2009ish  . Suicidal ideation    & HOMICIDAL IDEATION --  06-16-2013   ADMITTED TO BEHAVIOR HEALTH    Patient Active Problem List   Diagnosis Date Noted  . Uremia 03/08/2017  . Hemodialysis patient ([place]) 03/03/2017  . Weakness 02/28/2017  . Hypocalcemia 02/22/2017  . Shortness of breath 11/26/2016  . COPD (chronic obstructive pulmonary disease) ([place]) 10/30/2016  . COPD exacerbation ([place]) 10/29/2016  . Anemia   . Heme positive stool   . Ulceration of intestine   . Benign neoplasm of transverse colon   . Acute gastrointestinal hemorrhage   . Esophageal candidiasis ([place])   . Angiodysplasia of intestinal tract   . Acute respiratory  failure with hypoxia ([place]) 07/03/2016  . GI bleeding 06/24/2016  . Rectal bleeding 06/14/2016  . Anemia of chronic disease 06/01/2016  . MRSA carrier 06/01/2016  . Chronic renal failure 05/23/2016  . Ischemic heart disease 05/23/2016  . Angiodysplasia of small intestine   . Melena   . Small bowel bleed not requiring more than 4 units of blood in 24 hours, ICU, or surgery   . Anemia due to chronic blood loss   . Abdominal pain 05/05/2016  . Acute posthemorrhagic anemia 04/17/2016  . Gastrointestinal bleed 04/17/2016  . History of esophagogastroduodenoscopy (EGD) 04/17/2016  . Elevated troponin 04/17/2016  . Alcohol abuse 04/17/2016  . Upper GI bleed 01/19/2016  . Blood in stool   . Angiodysplasia of stomach and duodenum with hemorrhage   . Gastritis   . Esophagitis, unspecified   . GI bleed 05/16/2015  . Acute GI bleeding   . Symptomatic anemia 04/30/2015  . HTN (hypertension) 04/06/2015  . GERD (gastroesophageal reflux disease) 04/06/2015  . HLD (hyperlipidemia) 04/06/2015  . Dyspnea 04/06/2015  . Cirrhosis of liver with ascites ([place]) 04/06/2015  . Ascites 04/06/2015  . GIB (gastrointestinal bleeding) 03/23/2015  . Homicidal ideation 06/19/2013  . Suicidal intent 06/19/2013  . Homicidal ideations 06/19/2013  . Hyperkalemia 06/16/2013  . Mandible fracture ([place]) 06/05/2013  . Fracture, mandible ([place]) 06/02/2013  . Coronary atherosclerosis of  native coronary artery 06/02/2013  . ESRD on dialysis ([place]) 06/02/2013  . Mandible open fracture ([place]) 06/02/2013    Past Surgical History:  Procedure Laterality Date  . AGILE CAPSULE N/A 06/19/2016   Procedure: AGILE CAPSULE;  Surgeon: [name], MD;  Location: [facility] ENDOSCOPY;  Service: Endoscopy;  Laterality: N/A;  . COLONOSCOPY WITH PROPOFOL N/A 06/18/2016   Procedure: COLONOSCOPY WITH PROPOFOL;  Surgeon: [name], MD;  Location: [facility] ENDOSCOPY;  Service: Endoscopy;  Laterality: N/A;  . COLONOSCOPY WITH PROPOFOL N/A 08/12/2016    Procedure: COLONOSCOPY WITH PROPOFOL;  Surgeon: [name], MD;  Location: [facility] ENDOSCOPY;  Service: Endoscopy;  Laterality: N/A;  . CORONARY ANGIOPLASTY  ?   PT UNABLE TO TELL IF  BEFORE OR AFTER  CABG  . CORONARY ARTERY BYPASS GRAFT  2008  ([place] , [place])   3 VESSEL  . DIALYSIS FISTULA CREATION  LAST SURGERY  APPOX  2008  . ENTEROSCOPY N/A 05/10/2016   Procedure: ENTEROSCOPY;  Surgeon: Jerene Bears, MD;  Location: [place];  Service: Gastroenterology;  Laterality: N/A;  . ENTEROSCOPY N/A 08/12/2016   Procedure: ENTEROSCOPY;  Surgeon: [name], MD;  Location: [facility] ENDOSCOPY;  Service: Endoscopy;  Laterality: N/A;  . ESOPHAGOGASTRODUODENOSCOPY N/A 05/07/2015   Procedure: ESOPHAGOGASTRODUODENOSCOPY (EGD);  Surgeon: Hulen Luster, MD;  Location: [facility] ENDOSCOPY;  Service: Endoscopy;  Laterality: N/A;  . ESOPHAGOGASTRODUODENOSCOPY (EGD) WITH PROPOFOL N/A 05/17/2015   Procedure: ESOPHAGOGASTRODUODENOSCOPY (EGD) WITH PROPOFOL;  Surgeon: [name], MD;  Location: [facility] ENDOSCOPY;  Service: Endoscopy;  Laterality: N/A;  . ESOPHAGOGASTRODUODENOSCOPY (EGD) WITH PROPOFOL N/A 01/20/2016   Procedure: ESOPHAGOGASTRODUODENOSCOPY (EGD) WITH PROPOFOL;  Surgeon: [name], MD;  Location: [facility] ENDOSCOPY;  Service: Endoscopy;  Laterality: N/A;  . ESOPHAGOGASTRODUODENOSCOPY (EGD) WITH PROPOFOL N/A 04/17/2016   Procedure: ESOPHAGOGASTRODUODENOSCOPY (EGD) WITH PROPOFOL;  Surgeon: Lin Landsman, MD;  Location: [facility] ENDOSCOPY;  Service: Gastroenterology;  Laterality: N/A;  . ESOPHAGOGASTRODUODENOSCOPY (EGD) WITH PROPOFOL  05/09/2016   Procedure: ESOPHAGOGASTRODUODENOSCOPY (EGD) WITH PROPOFOL;  Surgeon: Jerene Bears, MD;  Location: [place];  Service: Endoscopy;;  . ESOPHAGOGASTRODUODENOSCOPY (EGD) WITH PROPOFOL N/A 06/16/2016   Procedure: ESOPHAGOGASTRODUODENOSCOPY (EGD) WITH PROPOFOL;  Surgeon: [name], MD;  Location: [facility] ENDOSCOPY;  Service: Endoscopy;  Laterality: N/A;  . GIVENS CAPSULE STUDY N/A 05/07/2016    Procedure: GIVENS CAPSULE STUDY;  Surgeon: Doran Stabler, MD;  Location: [place];  Service: Endoscopy;  Laterality: N/A;  . MANDIBULAR HARDWARE REMOVAL N/A 07/29/2013   Procedure: REMOVAL OF ARCH BARS;  Surgeon: Theodoro Kos, DO;  Location: [place];  Service: Plastics;  Laterality: N/A;  . ORIF MANDIBULAR FRACTURE N/A 06/05/2013   Procedure: REPAIR OF MANDIBULAR FRACTURE x 2 with maxillo-mandibular fixation ;  Surgeon: Theodoro Kos, DO;  Location: [place];  Service: Plastics;  Laterality: N/A;  . PERIPHERAL ARTERIAL STENT GRAFT Left     Prior to Admission medications   Medication Sig Start Date End Date Taking? Authorizing Provider  alum & mag hydroxide-simeth (MAALOX/MYLANTA) 200-200-20 MG/5ML suspension Take 15 mLs every 4 (four) hours as needed by mouth for indigestion or heartburn. 01/25/17  Yes Demetrios Loll, MD  amLODipine (NORVASC) 5 MG tablet Take 5 mg by mouth daily.   Yes [provider]  atorvastatin (LIPITOR) 40 MG tablet Take 1 tablet (40 mg total) by mouth daily. 11/16/16  Yes Vaughan Basta, MD  benazepril (LOTENSIN) 10 MG tablet Take 1 tablet by mouth daily.   Yes [provider]  budesonide-formoterol (SYMBICORT) 160-4.5 MCG/ACT inhaler Inhale 2  puffs into the lungs daily. 11/16/16  Yes Vaughan Basta, MD  calcium acetate (PHOSLO) 667 MG capsule Take 2,001 mg by mouth 3 (three) times daily.   Yes [provider]  cinacalcet (SENSIPAR) 30 MG tablet Take 1 tablet by mouth daily.   Yes [provider]  ferrous sulfate 325 (65 FE) MG tablet Take 1 tablet by mouth 3 (three) times daily. 01/21/17  Yes [provider]  furosemide (LASIX) 80 MG tablet Take 1 tablet (80 mg total) daily by mouth. 01/25/17  Yes Demetrios Loll, MD  ipratropium-albuterol (DUONEB) 0.5-2.5 (3) MG/3ML SOLN Take 3 mLs by nebulization every 6 (six) hours as needed. 11/28/16  Yes Wieting, Richard, MD  labetalol (NORMODYNE) 100 MG tablet  Take 1 tablet (100 mg total) by mouth daily. 11/16/16  Yes Vaughan Basta, MD  lactulose (CHRONULAC) 10 GM/15ML solution Take 30 mLs (20 g total) by mouth 3 (three) times daily. 01/11/17  Yes Mody, Ulice Bold, MD  Multiple Vitamins-Minerals-FA (DIALYVITE SUPREME D) 3 MG TABS Take 1 tablet (3 mg total) by mouth daily. 11/28/16  Yes Wieting, Richard, MD  nicotine (NICODERM CQ - DOSED IN MG/24 HOURS) 21 mg/24hr patch Place 1 patch (21 mg total) onto the skin daily. 01/12/17  Yes Mody, Ulice Bold, MD  pantoprazole (PROTONIX) 40 MG tablet Take 1 tablet (40 mg total) by mouth daily. 11/28/16  Yes Wieting, Richard, MD  sevelamer carbonate (RENVELA) 800 MG tablet Take 3 tablets (2,400 mg total) by mouth 3 (three) times daily with meals. 11/16/16  Yes Vaughan Basta, MD  tiotropium (SPIRIVA HANDIHALER) 18 MCG inhalation capsule Place 1 capsule (18 mcg total) into inhaler and inhale daily. 11/16/16 11/16/17 Yes Vaughan Basta, MD  albuterol (PROVENTIL HFA;VENTOLIN HFA) 108 (90 Base) MCG/ACT inhaler Inhale 2 puffs into the lungs every 6 (six) hours as needed for wheezing or shortness of breath. 02/19/17   Darel Hong, MD  gabapentin (NEURONTIN) 300 MG capsule TAKE 1 CAPSULE BY MOUTH EVERY EVENING AND 1 EXTRA CAP ON DIALYSIS DAYS AS NEEDED FOR NERVE PAIN 08/16/16   [provider]  nitroGLYCERIN (NITROSTAT) 0.4 MG SL tablet Place 1 tablet (0.4 mg total) under the tongue every 5 (five) minutes as needed. 04/12/16   Wende Bushy, MD  Spacer/Aero Chamber Mouthpiece MISC 1 Units by Does not apply route every 4 (four) hours as needed (wheezing). 02/19/17   Darel Hong, MD     Allergies Patient has no known allergies.  Family History  Problem Relation Age of Onset  . Colon cancer Mother   . Cancer Father   . Cancer Sister   . Kidney disease Brother     Social History Social History   Tobacco Use  . Smoking status: Current Every Day Smoker    Packs/day: 0.15    Years: 40.00    Pack years:  6.00    Types: Cigarettes  . Smokeless tobacco: Never Used  Substance Use Topics  . Alcohol use: No    Comment: pt reports quitting after learning about cirrhosis  . Drug use: No    Review of Systems  Constitutional: No fever/chills Eyes: No visual changes.  ENT: No neck pain Cardiovascular: Denies chest pain. Respiratory: As above Gastrointestinal: No abdominal pain.   Genitourinary: Does not make significant urine Musculoskeletal: Negative for back pain.  Minimal leg swelling Skin: Negative for rash. Neurological: Negative for headaches   ____________________________________________   PHYSICAL EXAM:  VITAL SIGNS: ED Triage Vitals [03/17/17 0913]  Enc Vitals Group     BP Mar[place] Kitchen)  143/88     Pulse Rate 91     Resp 18     Temp 98.2 F (36.8 C)     Temp Source Oral     SpO2 99 %     Weight 74.8 kg (165 lb)     Height 1.905 m (6\' 3" )     Head Circumference      Peak Flow      Pain Score      Pain Loc      Pain Edu?      Excl. in [place]?     Constitutional: Alert and oriented. No acute distress.  Eyes: Conjunctivae are normal.   Nose: No congestion/rhinnorhea. Mouth/Throat: Mucous membranes are moist.    Cardiovascular: Normal rate, regular rhythm. Grossly normal heart sounds.  Good peripheral circulation. Respiratory: Normal respiratory effort.  No retractions.  Scattered basilar wheezes, mild Gastrointestinal: Soft and nontender. No distention.  No CVA tenderness. Genitourinary: deferred Musculoskeletal:  Warm and well perfused Neurologic:  Normal speech and language. No gross focal neurologic deficits are appreciated.  Skin:  Skin is warm, dry and intact. No rash noted. Psychiatric: Mood and affect are normal. Speech and behavior are normal.  ____________________________________________   LABS (all labs ordered are listed, but only abnormal results are displayed)  Labs Reviewed - No data to  display ____________________________________________  EKG  None ____________________________________________  RADIOLOGY  None ____________________________________________   PROCEDURES  Procedure(s) performed: No  Procedures   Critical Care performed: No ____________________________________________   INITIAL IMPRESSION / ASSESSMENT AND PLAN / ED COURSE  Pertinent labs & imaging results that were available during my care of the patient were reviewed by me and considered in my medical decision making (see chart for details).  Patient here for primary reason of dialysis.  Not in extremis, will discuss with nephrologist.  ----------------------------------------- 10:09 AM on 03/17/2017 -----------------------------------------  Dr. Holley Raring will arrange for dialysis for the patient, no labs needed    ____________________________________________   FINAL CLINICAL IMPRESSION(S) / ED DIAGNOSES  Final diagnoses:  Dialysis patient, noncompliant ([place])  Encounter for dialysis [facility])        Note:  This document was prepared using Dragon voice recognition software and may include unintentional dictation errors.    Lavonia Drafts, MD 03/17/17 1013

## 2017-03-17 NOTE — Progress Notes (Signed)
Post HD assessment  

## 2017-03-17 NOTE — Progress Notes (Signed)
HD Tx ended, pt requested to be ready for transportation home.

## 2017-03-17 NOTE — Progress Notes (Signed)
Pre HD Tx  

## 2017-03-17 NOTE — Progress Notes (Signed)
Informed by Dr. Corky Downs that patient back in the ED for dialysis.  He doesn't have an outpatient unit for HD at this time.  Will go ahead and plan for HD today, UF target 2.5-3kg, will also check renal function panel and CBC.  Will work with dialysis liason to see if we can find a more suitable disposition.

## 2017-03-17 NOTE — ED Notes (Signed)
Pt taken to dialysis via wheelchair by this RN. All of belongings taken with patient.

## 2017-03-17 NOTE — ED Notes (Signed)
Pt back up from dialysis, states " I need to go , I don't have time to wait on yall" . This RN informed pt that doctor needs to see him before he writes discharge paperwork, we have to be patient because we are very busy right now.. RN informed pt of process when going through ED to get dialysis.  Pt states " Don't talk to me that you don't know nothing about, I was only kicked out of one dialysis center. I'm leaving" Pt walking off, refuses to sign AMA paperwork. Pt in NAD at this time.

## 2017-03-17 NOTE — Progress Notes (Signed)
New set up/machine and tx resumed.

## 2017-03-17 NOTE — ED Notes (Signed)
ED Provider at bedside. 

## 2017-03-17 NOTE — Progress Notes (Signed)
Pre Assessment HD

## 2017-03-17 NOTE — ED Notes (Signed)
Pt calling out for something to eat and drink, MD aware. Pt informed NPO until provider speaks with nephrology

## 2017-03-17 NOTE — Progress Notes (Signed)
Post HD Tx  

## 2017-03-24 ENCOUNTER — Other Ambulatory Visit: Payer: Self-pay

## 2017-03-24 ENCOUNTER — Encounter: Payer: Self-pay | Admitting: Emergency Medicine

## 2017-03-24 ENCOUNTER — Emergency Department
Admission: EM | Admit: 2017-03-24 | Discharge: 2017-03-24 | Disposition: A | Discharge status: Home / Self Care | Payer: Medicare Other | Attending: Emergency Medicine | Admitting: Emergency Medicine

## 2017-03-24 DIAGNOSIS — I2510 Atherosclerotic heart disease of native coronary artery without angina pectoris: Secondary | ICD-10-CM | POA: Insufficient documentation

## 2017-03-24 DIAGNOSIS — E875 Hyperkalemia: Secondary | ICD-10-CM | POA: Diagnosis not present

## 2017-03-24 DIAGNOSIS — Z79899 Other long term (current) drug therapy: Secondary | ICD-10-CM | POA: Insufficient documentation

## 2017-03-24 DIAGNOSIS — E785 Hyperlipidemia, unspecified: Secondary | ICD-10-CM | POA: Diagnosis not present

## 2017-03-24 DIAGNOSIS — F17210 Nicotine dependence, cigarettes, uncomplicated: Secondary | ICD-10-CM | POA: Diagnosis not present

## 2017-03-24 DIAGNOSIS — J449 Chronic obstructive pulmonary disease, unspecified: Secondary | ICD-10-CM | POA: Diagnosis not present

## 2017-03-24 DIAGNOSIS — I509 Heart failure, unspecified: Secondary | ICD-10-CM | POA: Insufficient documentation

## 2017-03-24 DIAGNOSIS — N186 End stage renal disease: Secondary | ICD-10-CM | POA: Diagnosis not present

## 2017-03-24 DIAGNOSIS — Z992 Dependence on renal dialysis: Secondary | ICD-10-CM | POA: Insufficient documentation

## 2017-03-24 DIAGNOSIS — E1140 Type 2 diabetes mellitus with diabetic neuropathy, unspecified: Secondary | ICD-10-CM | POA: Insufficient documentation

## 2017-03-24 DIAGNOSIS — I120 Hypertensive chronic kidney disease with stage 5 chronic kidney disease or end stage renal disease: Secondary | ICD-10-CM | POA: Diagnosis not present

## 2017-03-24 DIAGNOSIS — R0602 Shortness of breath: Secondary | ICD-10-CM | POA: Diagnosis not present

## 2017-03-24 DIAGNOSIS — N2581 Secondary hyperparathyroidism of renal origin: Secondary | ICD-10-CM | POA: Diagnosis not present

## 2017-03-24 DIAGNOSIS — I132 Hypertensive heart and chronic kidney disease with heart failure and with stage 5 chronic kidney disease, or end stage renal disease: Secondary | ICD-10-CM | POA: Insufficient documentation

## 2017-03-24 DIAGNOSIS — M7989 Other specified soft tissue disorders: Secondary | ICD-10-CM | POA: Diagnosis not present

## 2017-03-24 DIAGNOSIS — D631 Anemia in chronic kidney disease: Secondary | ICD-10-CM | POA: Diagnosis not present

## 2017-03-24 LAB — COMPREHENSIVE METABOLIC PANEL
ALBUMIN: 3.1 g/dL — AB (ref 3.5–5.0)
ALK PHOS: 177 U/L — AB (ref 38–126)
ALT: 14 U/L — ABNORMAL LOW (ref 17–63)
ANION GAP: 12 (ref 5–15)
AST: 22 U/L (ref 15–41)
BILIRUBIN TOTAL: 0.4 mg/dL (ref 0.3–1.2)
BUN: 72 mg/dL — ABNORMAL HIGH (ref 6–20)
CO2: 31 mmol/L (ref 22–32)
Calcium: 8 mg/dL — ABNORMAL LOW (ref 8.9–10.3)
Chloride: 92 mmol/L — ABNORMAL LOW (ref 101–111)
Creatinine, Ser: 12.97 mg/dL — ABNORMAL HIGH (ref 0.61–1.24)
GFR calc non Af Amer: 4 mL/min — ABNORMAL LOW (ref >60)
GFR, EST AFRICAN AMERICAN: 4 mL/min — AB (ref >60)
GLUCOSE: 86 mg/dL (ref 65–99)
Potassium: 6.8 mmol/L (ref 3.5–5.1)
Sodium: 135 mmol/L (ref 135–145)
TOTAL PROTEIN: 7.1 g/dL (ref 6.5–8.1)

## 2017-03-24 LAB — CBC WITH DIFFERENTIAL/PLATELET
BASOS PCT: 1 %
Basophils Absolute: 0.1 10*3/uL (ref 0–0.1)
Eosinophils Absolute: 0.1 10*3/uL (ref 0–0.7)
Eosinophils Relative: 2 %
HEMATOCRIT: 30.1 % — AB (ref 40.0–52.0)
Hemoglobin: 9.4 g/dL — ABNORMAL LOW (ref 13.0–18.0)
Lymphocytes Relative: 11 %
Lymphs Abs: 0.9 10*3/uL — ABNORMAL LOW (ref 1.0–3.6)
MCH: 26.8 pg (ref 26.0–34.0)
MCHC: 31.2 g/dL — ABNORMAL LOW (ref 32.0–36.0)
MCV: 85.9 fL (ref 80.0–100.0)
MONO ABS: 0.8 10*3/uL (ref 0.2–1.0)
Monocytes Relative: 10 %
NEUTROS ABS: 6.1 10*3/uL (ref 1.4–6.5)
Neutrophils Relative %: 76 %
PLATELETS: 373 10*3/uL (ref 150–440)
RBC: 3.5 MIL/uL — ABNORMAL LOW (ref 4.40–5.90)
RDW: 19.6 % — AB (ref 11.5–14.5)
WBC: 8 10*3/uL (ref 3.8–10.6)

## 2017-03-24 LAB — PROTIME-INR
INR: 1.03
PROTHROMBIN TIME: 13.4 s (ref 11.4–15.2)

## 2017-03-24 LAB — APTT: aPTT: 32 seconds (ref 24–36)

## 2017-03-24 MED ORDER — EPOETIN ALFA 10000 UNIT/ML IJ SOLN
10000.0000 [IU] | Freq: Once | INTRAMUSCULAR | Status: AC
  Administered 2017-03-24: 10000 [IU] via INTRAVENOUS

## 2017-03-24 MED ORDER — GABAPENTIN 600 MG PO TABS
ORAL_TABLET | ORAL | Status: AC
  Administered 2017-03-24: 300 mg
  Filled 2017-03-24: qty 1

## 2017-03-24 MED ORDER — GABAPENTIN 300 MG PO CAPS: 300.0000 mg | ORAL_CAPSULE | Freq: Once | ORAL | Status: DC

## 2017-03-24 NOTE — ED Provider Notes (Signed)
[facility] Emergency Department Provider Note   ____________________________________________    I have reviewed the triage vital signs and the nursing notes.   HISTORY  Chief Complaint Needs Dialysis and Needs Paracentesis     HPI Stanley [name] is a 57 y.o. male who states he is here for dialysis.  Apparently he has not had dialysis since the last time I saw him on 4 January.  He reports mild shortness of breath and some lower extremity swelling.  He also states that he needs paracentesis because he has not called his provider who usually arranges this for him.  He denies abdominal pain.  No fevers or chills.  No cough.  No nausea or vomiting.  As noted before patient is apparently no longer welcome in any of the local dialysis facilities.   Past Medical History:  Diagnosis Date  . Alcohol abuse   . CHF (congestive heart failure) ([place])   . Cirrhosis ([place])   . Coronary artery disease 2009  . Diabetic peripheral neuropathy ([place])   . Drug abuse ([place])   . End stage renal disease on dialysis [facility]) NEPHROLOGIST-   [facility]  IN [place]   HEMODIALYSIS --   TUES/  THURS/  SAT  . GERD (gastroesophageal reflux disease)   . Hyperlipidemia   . Hypertension   . PAD (peripheral artery disease) ([place])   . Renal insufficiency    Per pt, 32 oz fluid restriction per day  . S/P triple vessel bypass 06/09/2016   2009ish  . Suicidal ideation    & HOMICIDAL IDEATION --  06-16-2013   ADMITTED TO BEHAVIOR HEALTH    Patient Active Problem List   Diagnosis Date Noted  . Uremia 03/08/2017  . Hemodialysis patient ([place]) 03/03/2017  . Weakness 02/28/2017  . Hypocalcemia 02/22/2017  . Shortness of breath 11/26/2016  . COPD (chronic obstructive pulmonary disease) ([place]) 10/30/2016  . COPD exacerbation ([place]) 10/29/2016  . Anemia   . Heme positive stool   . Ulceration of intestine   . Benign neoplasm of transverse colon   . Acute gastrointestinal hemorrhage     . Esophageal candidiasis ([place])   . Angiodysplasia of intestinal tract   . Acute respiratory failure with hypoxia ([place]) 07/03/2016  . GI bleeding 06/24/2016  . Rectal bleeding 06/14/2016  . Anemia of chronic disease 06/01/2016  . MRSA carrier 06/01/2016  . Chronic renal failure 05/23/2016  . Ischemic heart disease 05/23/2016  . Angiodysplasia of small intestine   . Melena   . Small bowel bleed not requiring more than 4 units of blood in 24 hours, ICU, or surgery   . Anemia due to chronic blood loss   . Abdominal pain 05/05/2016  . Acute posthemorrhagic anemia 04/17/2016  . Gastrointestinal bleed 04/17/2016  . History of esophagogastroduodenoscopy (EGD) 04/17/2016  . Elevated troponin 04/17/2016  . Alcohol abuse 04/17/2016  . Upper GI bleed 01/19/2016  . Blood in stool   . Angiodysplasia of stomach and duodenum with hemorrhage   . Gastritis   . Esophagitis, unspecified   . GI bleed 05/16/2015  . Acute GI bleeding   . Symptomatic anemia 04/30/2015  . HTN (hypertension) 04/06/2015  . GERD (gastroesophageal reflux disease) 04/06/2015  . HLD (hyperlipidemia) 04/06/2015  . Dyspnea 04/06/2015  . Cirrhosis of liver with ascites ([place]) 04/06/2015  . Ascites 04/06/2015  . GIB (gastrointestinal bleeding) 03/23/2015  . Homicidal ideation 06/19/2013  . Suicidal intent 06/19/2013  . Homicidal ideations 06/19/2013  . Hyperkalemia  06/16/2013  . Mandible fracture ([place]) 06/05/2013  . Fracture, mandible ([place]) 06/02/2013  . Coronary atherosclerosis of native coronary artery 06/02/2013  . ESRD on dialysis ([place]) 06/02/2013  . Mandible open fracture ([place]) 06/02/2013    Past Surgical History:  Procedure Laterality Date  . AGILE CAPSULE N/A 06/19/2016   Procedure: AGILE CAPSULE;  Surgeon: [name], MD;  Location: [facility] ENDOSCOPY;  Service: Endoscopy;  Laterality: N/A;  . COLONOSCOPY WITH PROPOFOL N/A 06/18/2016   Procedure: COLONOSCOPY WITH PROPOFOL;  Surgeon: [name], MD;  Location: [facility]  ENDOSCOPY;  Service: Endoscopy;  Laterality: N/A;  . COLONOSCOPY WITH PROPOFOL N/A 08/12/2016   Procedure: COLONOSCOPY WITH PROPOFOL;  Surgeon: [name], MD;  Location: [facility] ENDOSCOPY;  Service: Endoscopy;  Laterality: N/A;  . CORONARY ANGIOPLASTY  ?   PT UNABLE TO TELL IF  BEFORE OR AFTER  CABG  . CORONARY ARTERY BYPASS GRAFT  2008  ([place])   3 VESSEL  . DIALYSIS FISTULA CREATION  LAST SURGERY  APPOX  2008  . ENTEROSCOPY N/A 05/10/2016   Procedure: ENTEROSCOPY;  Surgeon: Jerene Bears, MD;  Location: [place];  Service: Gastroenterology;  Laterality: N/A;  . ENTEROSCOPY N/A 08/12/2016   Procedure: ENTEROSCOPY;  Surgeon: [name], MD;  Location: [facility] ENDOSCOPY;  Service: Endoscopy;  Laterality: N/A;  . ESOPHAGOGASTRODUODENOSCOPY N/A 05/07/2015   Procedure: ESOPHAGOGASTRODUODENOSCOPY (EGD);  Surgeon: Hulen Luster, MD;  Location: [facility] ENDOSCOPY;  Service: Endoscopy;  Laterality: N/A;  . ESOPHAGOGASTRODUODENOSCOPY (EGD) WITH PROPOFOL N/A 05/17/2015   Procedure: ESOPHAGOGASTRODUODENOSCOPY (EGD) WITH PROPOFOL;  Surgeon: [name], MD;  Location: [facility] ENDOSCOPY;  Service: Endoscopy;  Laterality: N/A;  . ESOPHAGOGASTRODUODENOSCOPY (EGD) WITH PROPOFOL N/A 01/20/2016   Procedure: ESOPHAGOGASTRODUODENOSCOPY (EGD) WITH PROPOFOL;  Surgeon: [name], MD;  Location: [facility] ENDOSCOPY;  Service: Endoscopy;  Laterality: N/A;  . ESOPHAGOGASTRODUODENOSCOPY (EGD) WITH PROPOFOL N/A 04/17/2016   Procedure: ESOPHAGOGASTRODUODENOSCOPY (EGD) WITH PROPOFOL;  Surgeon: Lin Landsman, MD;  Location: [facility] ENDOSCOPY;  Service: Gastroenterology;  Laterality: N/A;  . ESOPHAGOGASTRODUODENOSCOPY (EGD) WITH PROPOFOL  05/09/2016   Procedure: ESOPHAGOGASTRODUODENOSCOPY (EGD) WITH PROPOFOL;  Surgeon: Jerene Bears, MD;  Location: [place];  Service: Endoscopy;;  . ESOPHAGOGASTRODUODENOSCOPY (EGD) WITH PROPOFOL N/A 06/16/2016   Procedure: ESOPHAGOGASTRODUODENOSCOPY (EGD) WITH PROPOFOL;  Surgeon: [name], MD;   Location: [facility] ENDOSCOPY;  Service: Endoscopy;  Laterality: N/A;  . GIVENS CAPSULE STUDY N/A 05/07/2016   Procedure: GIVENS CAPSULE STUDY;  Surgeon: Doran Stabler, MD;  Location: [place];  Service: Endoscopy;  Laterality: N/A;  . MANDIBULAR HARDWARE REMOVAL N/A 07/29/2013   Procedure: REMOVAL OF ARCH BARS;  Surgeon: Theodoro Kos, DO;  Location: [place];  Service: Plastics;  Laterality: N/A;  . ORIF MANDIBULAR FRACTURE N/A 06/05/2013   Procedure: REPAIR OF MANDIBULAR FRACTURE x 2 with maxillo-mandibular fixation ;  Surgeon: Theodoro Kos, DO;  Location: [place];  Service: Plastics;  Laterality: N/A;  . PERIPHERAL ARTERIAL STENT GRAFT Left     Prior to Admission medications   Medication Sig Start Date End Date Taking? Authorizing Provider  albuterol (PROVENTIL HFA;VENTOLIN HFA) 108 (90 Base) MCG/ACT inhaler Inhale 2 puffs into the lungs every 6 (six) hours as needed for wheezing or shortness of breath. 02/19/17  Yes Darel Hong, MD  alum & mag hydroxide-simeth (MAALOX/MYLANTA) 200-200-20 MG/5ML suspension Take 15 mLs every 4 (four) hours as needed by mouth for indigestion or heartburn. 01/25/17  Yes Demetrios Loll, MD  amLODipine (NORVASC) 5 MG tablet Take 5 mg by mouth daily.  Yes [provider]  atorvastatin (LIPITOR) 40 MG tablet Take 1 tablet (40 mg total) by mouth daily. 11/16/16  Yes Vaughan Basta, MD  benazepril (LOTENSIN) 10 MG tablet Take 1 tablet by mouth daily.   Yes [provider]  budesonide-formoterol (SYMBICORT) 160-4.5 MCG/ACT inhaler Inhale 2 puffs into the lungs daily. 11/16/16  Yes Vaughan Basta, MD  calcium acetate (PHOSLO) 667 MG capsule Take 2,001 mg by mouth 3 (three) times daily.   Yes [provider]  cinacalcet (SENSIPAR) 30 MG tablet Take 1 tablet by mouth daily.   Yes [provider]  ferrous sulfate 325 (65 FE) MG tablet Take 1 tablet by mouth 3 (three) times daily. 01/21/17  Yes [provider]  furosemide (LASIX) 80 MG tablet Take 1 tablet (80 mg total) daily by mouth. 01/25/17  Yes Demetrios Loll, MD  gabapentin (NEURONTIN) 300 MG capsule TAKE 1 CAPSULE BY MOUTH EVERY EVENING AND 1 EXTRA CAP ON DIALYSIS DAYS AS NEEDED FOR NERVE PAIN 08/16/16  Yes [provider]  ipratropium-albuterol (DUONEB) 0.5-2.5 (3) MG/3ML SOLN Take 3 mLs by nebulization every 6 (six) hours as needed. 11/28/16  Yes Wieting, Richard, MD  labetalol (NORMODYNE) 100 MG tablet Take 1 tablet (100 mg total) by mouth daily. 11/16/16  Yes Vaughan Basta, MD  lactulose (CHRONULAC) 10 GM/15ML solution Take 30 mLs (20 g total) by mouth 3 (three) times daily. 01/11/17  Yes Mody, Ulice Bold, MD  Multiple Vitamins-Minerals-FA (DIALYVITE SUPREME D) 3 MG TABS Take 1 tablet (3 mg total) by mouth daily. 11/28/16  Yes Wieting, Richard, MD  nitroGLYCERIN (NITROSTAT) 0.4 MG SL tablet Place 1 tablet (0.4 mg total) under the tongue every 5 (five) minutes as needed. 04/12/16  Yes Wende Bushy, MD  pantoprazole (PROTONIX) 40 MG tablet Take 1 tablet (40 mg total) by mouth daily. 11/28/16  Yes Wieting, Richard, MD  sevelamer carbonate (RENVELA) 800 MG tablet Take 3 tablets (2,400 mg total) by mouth 3 (three) times daily with meals. 11/16/16  Yes Vaughan Basta, MD  Spacer/Aero Chamber Mouthpiece MISC 1 Units by Does not apply route every 4 (four) hours as needed (wheezing). 02/19/17  Yes Darel Hong, MD  tiotropium (SPIRIVA HANDIHALER) 18 MCG inhalation capsule Place 1 capsule (18 mcg total) into inhaler and inhale daily. 11/16/16 11/16/17 Yes Vaughan Basta, MD  nicotine (NICODERM CQ - DOSED IN MG/24 HOURS) 21 mg/24hr patch Place 1 patch (21 mg total) onto the skin daily. Patient not taking: Reported on 03/24/2017 01/12/17   Bettey Costa, MD     Allergies Patient has no known allergies.  Family History  Problem Relation Age of Onset  . Colon cancer Mother   . Cancer Father   . Cancer Sister   . Kidney disease  Brother     Social History Social History   Tobacco Use  . Smoking status: Current Every Day Smoker    Packs/day: 0.15    Years: 40.00    Pack years: 6.00    Types: Cigarettes  . Smokeless tobacco: Never Used  Substance Use Topics  . Alcohol use: No    Comment: pt reports quitting after learning about cirrhosis  . Drug use: No    Review of Systems  Constitutional: No fever/chills Eyes: No visual changes.  ENT: No neck pain Cardiovascular: Denies chest pain. Respiratory: mild shortness of breath Gastrointestinal: Abdominal distention.  No abdominal pain.  No nausea, no vomiting.   Genitourinary: Does not make significant urine Musculoskeletal: Negative for back pain. Skin: Negative for  rash. Neurological: Negative for headaches    ____________________________________________   PHYSICAL EXAM:  VITAL SIGNS: ED Triage Vitals  Enc Vitals Group     BP 03/24/17 0900 123/81     Pulse Rate 03/24/17 0900 76     Resp 03/24/17 0900 18     Temp 03/24/17 0900 98.2 F (36.8 C)     Temp Source 03/24/17 0900 Oral     SpO2 03/24/17 0900 97 %     Weight 03/24/17 0900 72.6 kg (160 lb)     Height 03/24/17 0900 1.905 m (6\' 3" )     Head Circumference --      Peak Flow --      Pain Score 03/24/17 0859 8     Pain Loc --      Pain Edu? --      Excl. in [place]? --     Constitutional: Alert and oriented. No acute distress.  Eyes: Conjunctivae are normal.   Nose: No congestion/rhinnorhea. Mouth/Throat: Mucous membranes are moist.   Neck:  Painless ROM Cardiovascular: Normal rate, regular rhythm. Grossly normal heart sounds.  Good peripheral circulation. Respiratory: Normal respiratory effort.  No retractions.  Bibasilar rales Gastrointestinal: Soft and nontender.  Moderate distention.  No CVA tenderness. Genitourinary: deferred Musculoskeletal: 1-2+ lower extremity edema. warm and well perfused Neurologic:  Normal speech and language. No gross focal neurologic deficits are  appreciated.  Skin:  Skin is warm, dry and intact. No rash noted. Psychiatric: Mood and affect are normal. Speech and behavior are normal.  ____________________________________________   LABS (all labs ordered are listed, but only abnormal results are displayed)  Labs Reviewed  COMPREHENSIVE METABOLIC PANEL - Abnormal; Notable for the following components:      Result Value   Potassium 6.8 (*)    Chloride 92 (*)    BUN 72 (*)    Creatinine, Ser 12.97 (*)    Calcium 8.0 (*)    Albumin 3.1 (*)    ALT 14 (*)    Alkaline Phosphatase 177 (*)    GFR calc non Af Amer 4 (*)    GFR calc Af Amer 4 (*)    All other components within normal limits  CBC WITH DIFFERENTIAL/PLATELET - Abnormal; Notable for the following components:   RBC 3.50 (*)    Hemoglobin 9.4 (*)    HCT 30.1 (*)    MCHC 31.2 (*)    RDW 19.6 (*)    Lymphs Abs 0.9 (*)    All other components within normal limits  APTT  PROTIME-INR   ____________________________________________  EKG  ED ECG REPORT I, Lavonia Drafts, the attending physician, personally viewed and interpreted this ECG.  Date: 03/24/2017 EKG Time:  Rate: 83 Rhythm: normal sinus rhythm QRS Axis: normal Intervals: abnormal ST/T Wave abnormalities: normal Narrative Interpretation: Unchanged from prior  ____________________________________________  RADIOLOGY  None ____________________________________________   PROCEDURES  Procedure(s) performed: No  Procedures   Critical Care performed: No ____________________________________________   INITIAL IMPRESSION / ASSESSMENT AND PLAN / ED COURSE  Pertinent labs & imaging results that were available during my care of the patient were reviewed by me and considered in my medical decision making (see chart for details).  Patient in no acute distress.  Mild shortness of breath, no rales on exam.  Normal heart rate, will check EKG, basic labs.  Have discussed with Dr. Juleen China of nephrology will  arrange dialysis, no indication for emergent paracentesis at this time  ----------------------------------------- 11:06 AM on 03/24/2017 -----------------------------------------  Discussed  potassium with Dr. Juleen China, he is aware and is arranging dialysis.    ____________________________________________   FINAL CLINICAL IMPRESSION(S) / ED DIAGNOSES  Final diagnoses:  Encounter for dialysis [facility])        Note:  This document was prepared using Dragon voice recognition software and may include unintentional dictation errors.    Lavonia Drafts, MD 03/24/17 [phone]

## 2017-03-24 NOTE — Progress Notes (Signed)
PRE HD ASSESSMENT 

## 2017-03-24 NOTE — ED Notes (Addendum)
Dr Corky Downs notified in person of pts K 6.8 ekg ordered

## 2017-03-24 NOTE — ED Provider Notes (Signed)
Patient presents after dialysis.  No complications.  Patient able to ambulate with steady gait.  Stable for outpatient follow-up.   Merlyn Lot, MD 03/24/17 1655

## 2017-03-24 NOTE — Progress Notes (Signed)
[facility]  ROUNDING NOTE   Subjective:   Seen and examined on hemodialysis.   Objective:  Vital signs in last 24 hours:  Temp:  [98.1 F (36.7 C)-98.2 F (36.8 C)] 98.1 F (36.7 C) (01/11 1230) Pulse Rate:  [73-87] 77 (01/11 1500) Resp:  [11-24] 17 (01/11 1500) BP: (103-139)/(64-87) 128/71 (01/11 1500) SpO2:  [92 %-100 %] 100 % (01/11 1500) Weight:  [72.6 kg (160 lb)-72.6 kg (160 lb 0.9 oz)] 72.6 kg (160 lb 0.9 oz) (01/11 1230)  Weight change:  Filed Weights   03/24/17 0900 03/24/17 1230  Weight: 72.6 kg (160 lb) 72.6 kg (160 lb 0.9 oz)    Intake/Output: No intake/output data recorded.   Intake/Output this shift:  No intake/output data recorded.  Physical Exam: General: NAD,   Head: Normocephalic, atraumatic. Moist oral mucosal membranes  Eyes: Anicteric, PERRL  Neck: Supple, trachea midline  Lungs:  Clear to auscultation  Heart: Regular rate and rhythm  Abdomen:  Soft, nontender,   Extremities: No peripheral edema.  Neurologic: Nonfocal, moving all four extremities  Skin: No lesions  Access: Right AVF    Basic Metabolic Panel: Recent Labs  Lab 03/24/17 0919  NA 135  K 6.8*  CL 92*  CO2 31  GLUCOSE 86  BUN 72*  CREATININE 12.97*  CALCIUM 8.0*    Liver Function Tests: Recent Labs  Lab 03/24/17 0919  AST 22  ALT 14*  ALKPHOS 177*  BILITOT 0.4  PROT 7.1  ALBUMIN 3.1*   No results for input(s): LIPASE, AMYLASE in the last 168 hours. No results for input(s): AMMONIA in the last 168 hours.  CBC: Recent Labs  Lab 03/24/17 0919  WBC 8.0  NEUTROABS 6.1  HGB 9.4*  HCT 30.1*  MCV 85.9  PLT 373    Cardiac Enzymes: No results for input(s): CKTOTAL, CKMB, CKMBINDEX, TROPONINI in the last 168 hours.  BNP: Invalid input(s): POCBNP  CBG: No results for input(s): GLUCAP in the last 168 hours.  Microbiology: Results for orders placed or performed during the hospital encounter of 02/22/17  Blood culture (routine x 2)      Status: None   Collection Time: 02/22/17  2:03 PM  Result Value Ref Range Status   Specimen Description BLOOD LFOA  Final   Special Requests   Final    BOTTLES DRAWN AEROBIC AND ANAEROBIC Blood Culture adequate volume   Culture NO GROWTH 5 DAYS  Final   Report Status 02/27/2017 FINAL  Final  Blood culture (routine x 2)     Status: None   Collection Time: 02/22/17  2:03 PM  Result Value Ref Range Status   Specimen Description BLOOD LEFT HAND  Final   Special Requests   Final    BOTTLES DRAWN AEROBIC AND ANAEROBIC Blood Culture adequate volume   Culture NO GROWTH 5 DAYS  Final   Report Status 02/27/2017 FINAL  Final  MRSA PCR Screening     Status: None   Collection Time: 02/22/17  8:47 PM  Result Value Ref Range Status   MRSA by PCR NEGATIVE NEGATIVE Final    Comment:        The GeneXpert MRSA Assay (FDA approved for NASAL specimens only), is one component of a comprehensive MRSA colonization surveillance program. It is not intended to diagnose MRSA infection nor to guide or monitor treatment for MRSA infections.     Coagulation Studies: Recent Labs    03/24/17 0943  LABPROT 13.4  INR 1.03    Urinalysis:  No results for input(s): COLORURINE, LABSPEC, [place], GLUCOSEU, HGBUR, BILIRUBINUR, KETONESUR, PROTEINUR, UROBILINOGEN, NITRITE, LEUKOCYTESUR in the last 72 hours.  Invalid input(s): APPERANCEUR    Imaging: No results found.   Medications:    . epoetin (EPOGEN/PROCRIT) injection  10,000 Units Intravenous Once     Assessment/ Plan:  [name] is a 57 y.o. black male with end stage renal disease on hemodialysis secondary to Alport's syndrome, ascites, hypertension, anemia of chronic kidney disease, coronary artery disease, peripheral vascular disease, hyperlipidemia, gastrointestinal AVMs, congestive heart failure  [facility] TTS [place] AVF- no outpatient dialysis unit currently  1. Hyperkalemia 2. Anemia of chronic kidney disease 3.  Secondary Hyperparathyroidism 4. Ascites 5. Hypertension  Placed on hemodialysis 2 K bath. Tolerating treatment.   Ultrasound guided paracentesis: scheduled for Monday 1/14 on 1pm.     LOS: 0 Stanley [name] 1/11/20193:08 PM

## 2017-03-24 NOTE — ED Notes (Signed)
Pt taken to dialysis 

## 2017-03-24 NOTE — Discharge Instructions (Signed)
Paracentesis, Care After °Refer to this sheet in the next few weeks. These instructions provide you with information about caring for yourself after your procedure. Your health care provider may also give you more specific instructions. Your treatment has been planned according to current medical practices, but problems sometimes occur. Call your health care provider if you have any problems or questions after your procedure. °What can I expect after the procedure? °After your procedure, it is common to have a small amount of clear fluid coming from the puncture site. °Follow these instructions at home: °· Return to your normal activities as told by your health care provider. Ask your health care provider what activities are safe for you. °· Take over-the-counter and prescription medicines only as told by your health care provider. °· Do not take baths, swim, or use a hot tub until your health care provider approves. °· Follow instructions from your health care provider about: °? How to take care of your puncture site. °? When and how you should change your bandage (dressing). °? When you should remove your dressing. °· Check your puncture area every day signs of infection. Watch for: °? Redness, swelling, or pain. °? Fluid, blood, or pus. °· Keep all follow-up visits as told by your health care provider. This is important. °Contact a health care provider if: °· You have redness, swelling, or pain at your puncture site. °· You start to have more clear fluid coming from your puncture site. °· You have blood or pus coming from your puncture site. °· You have chills. °· You have a fever. °Get help right away if: °· You develop chest pain or shortness of breath. °· You develop increasing pain, discomfort, or swelling in your abdomen. °· You feel dizzy or light-headed or you pass out. °This information is not intended to replace advice given to you by your health care provider. Make sure you discuss any questions you  have with your health care provider. °Document Released: 07/15/2014 Document Revised: 08/06/2015 Document Reviewed: 05/13/2014 °Elsevier Interactive Patient Education © 2018 [employer] ° °

## 2017-03-24 NOTE — Progress Notes (Signed)
Post HD assessment  

## 2017-03-24 NOTE — Progress Notes (Signed)
HD tx end  

## 2017-03-24 NOTE — ED Triage Notes (Signed)
Pt here for dialysis, pt states he has not had dialysis for the past week, states he also needs a paracentesis states he gets one every week, but has not called his doctor to let them know his abdomen is getting tight.

## 2017-03-24 NOTE — Progress Notes (Signed)
PRE HD   

## 2017-03-24 NOTE — Progress Notes (Signed)
HD started. 

## 2017-03-27 ENCOUNTER — Other Ambulatory Visit: Payer: Self-pay | Admitting: Nephrology

## 2017-03-27 ENCOUNTER — Encounter: Payer: Self-pay | Admitting: Emergency Medicine

## 2017-03-27 ENCOUNTER — Emergency Department
Admission: EM | Admit: 2017-03-27 | Discharge: 2017-03-27 | Disposition: A | Discharge status: Home / Self Care | Payer: Medicare Other | Attending: Emergency Medicine | Admitting: Emergency Medicine

## 2017-03-27 ENCOUNTER — Other Ambulatory Visit: Payer: Self-pay

## 2017-03-27 ENCOUNTER — Ambulatory Visit
Admission: RE | Admit: 2017-03-27 | Discharge: 2017-03-27 | Disposition: A | Discharge status: Home / Self Care | Payer: Medicare Other | Source: Ambulatory Visit | Attending: Nephrology | Admitting: Nephrology

## 2017-03-27 DIAGNOSIS — J449 Chronic obstructive pulmonary disease, unspecified: Secondary | ICD-10-CM | POA: Diagnosis not present

## 2017-03-27 DIAGNOSIS — N2581 Secondary hyperparathyroidism of renal origin: Secondary | ICD-10-CM | POA: Diagnosis not present

## 2017-03-27 DIAGNOSIS — R188 Other ascites: Secondary | ICD-10-CM

## 2017-03-27 DIAGNOSIS — Z4931 Encounter for adequacy testing for hemodialysis: Secondary | ICD-10-CM | POA: Diagnosis present

## 2017-03-27 DIAGNOSIS — R079 Chest pain, unspecified: Secondary | ICD-10-CM | POA: Diagnosis not present

## 2017-03-27 DIAGNOSIS — F17210 Nicotine dependence, cigarettes, uncomplicated: Secondary | ICD-10-CM | POA: Insufficient documentation

## 2017-03-27 DIAGNOSIS — I509 Heart failure, unspecified: Secondary | ICD-10-CM | POA: Insufficient documentation

## 2017-03-27 DIAGNOSIS — I132 Hypertensive heart and chronic kidney disease with heart failure and with stage 5 chronic kidney disease, or end stage renal disease: Secondary | ICD-10-CM | POA: Insufficient documentation

## 2017-03-27 DIAGNOSIS — Z79899 Other long term (current) drug therapy: Secondary | ICD-10-CM | POA: Insufficient documentation

## 2017-03-27 DIAGNOSIS — Z992 Dependence on renal dialysis: Secondary | ICD-10-CM | POA: Diagnosis not present

## 2017-03-27 DIAGNOSIS — I2510 Atherosclerotic heart disease of native coronary artery without angina pectoris: Secondary | ICD-10-CM | POA: Insufficient documentation

## 2017-03-27 DIAGNOSIS — E875 Hyperkalemia: Secondary | ICD-10-CM | POA: Diagnosis not present

## 2017-03-27 DIAGNOSIS — N189 Chronic kidney disease, unspecified: Secondary | ICD-10-CM

## 2017-03-27 DIAGNOSIS — N186 End stage renal disease: Secondary | ICD-10-CM | POA: Insufficient documentation

## 2017-03-27 DIAGNOSIS — I120 Hypertensive chronic kidney disease with stage 5 chronic kidney disease or end stage renal disease: Secondary | ICD-10-CM | POA: Diagnosis not present

## 2017-03-27 DIAGNOSIS — E1122 Type 2 diabetes mellitus with diabetic chronic kidney disease: Secondary | ICD-10-CM | POA: Diagnosis not present

## 2017-03-27 DIAGNOSIS — R601 Generalized edema: Secondary | ICD-10-CM | POA: Diagnosis not present

## 2017-03-27 DIAGNOSIS — D631 Anemia in chronic kidney disease: Secondary | ICD-10-CM | POA: Diagnosis not present

## 2017-03-27 LAB — RENAL FUNCTION PANEL
ANION GAP: 13 (ref 5–15)
Albumin: 2.9 g/dL — ABNORMAL LOW (ref 3.5–5.0)
BUN: 28 mg/dL — ABNORMAL HIGH (ref 6–20)
CALCIUM: 8.5 mg/dL — AB (ref 8.9–10.3)
CHLORIDE: 95 mmol/L — AB (ref 101–111)
CO2: 32 mmol/L (ref 22–32)
Creatinine, Ser: 5.64 mg/dL — ABNORMAL HIGH (ref 0.61–1.24)
GFR calc non Af Amer: 10 mL/min — ABNORMAL LOW (ref >60)
GFR, EST AFRICAN AMERICAN: 12 mL/min — AB (ref >60)
GLUCOSE: 81 mg/dL (ref 65–99)
Phosphorus: 1.9 mg/dL — ABNORMAL LOW (ref 2.5–4.6)
Potassium: 4.4 mmol/L (ref 3.5–5.1)
SODIUM: 140 mmol/L (ref 135–145)

## 2017-03-27 MED ORDER — ALBUMIN HUMAN 25 % IV SOLN
25.0000 g | Freq: Once | INTRAVENOUS | Status: AC
  Administered 2017-03-27: 25 g via INTRAVENOUS
  Filled 2017-03-27: qty 100

## 2017-03-27 MED ORDER — ALBUMIN HUMAN 25 % IV SOLN
INTRAVENOUS | Status: AC
  Administered 2017-03-27: 25 g via INTRAVENOUS
  Filled 2017-03-27: qty 100

## 2017-03-27 NOTE — Progress Notes (Signed)
PRE HD   

## 2017-03-27 NOTE — Progress Notes (Signed)
HD TX started  

## 2017-03-27 NOTE — ED Provider Notes (Signed)
[name] Provider Note  ____________________________________________   First MD Initiated Contact with Patient 03/27/17 0935     (approximate)  I have reviewed the triage vital signs and the nursing notes.   HISTORY  Chief Complaint outpatient dialysis   HPI Sy Saintjean is a 57 y.o. male history of alcohol abuse, CHF, cirrhosis, end-stage renal [place] who is presenting to the emergency department today for his scheduled dialysis treatment.  He currently does not have a dialysis center and so has been coming to the emergency department and obtaining his Monday, Wednesday Friday dialysis regimen.  He was last dialyzed this past Friday.  He says that his abdomen also feels full and says that he is scheduled for paracentesis this afternoon.  He has not reporting any fever.   Past Medical History:  Diagnosis Date  . Alcohol abuse   . CHF (congestive heart failure) ([place])   . Cirrhosis ([place])   . Coronary artery disease 2009  . Diabetic peripheral neuropathy ([place])   . Drug abuse ([place])   . End stage renal disease on dialysis [facility]) NEPHROLOGIST-   DR [facility]  IN [place]   HEMODIALYSIS --   TUES/  THURS/  SAT  . GERD (gastroesophageal reflux disease)   . Hyperlipidemia   . Hypertension   . PAD (peripheral artery disease) ([place])   . Renal insufficiency    Per pt, 32 oz fluid restriction per day  . S/P triple vessel bypass 06/09/2016   2009ish  . Suicidal ideation    & HOMICIDAL IDEATION --  06-16-2013   ADMITTED TO BEHAVIOR HEALTH    Patient Active Problem List   Diagnosis Date Noted  . Uremia 03/08/2017  . Hemodialysis patient ([place]) 03/03/2017  . Weakness 02/28/2017  . Hypocalcemia 02/22/2017  . Shortness of breath 11/26/2016  . COPD (chronic obstructive pulmonary disease) ([place]) 10/30/2016  . COPD exacerbation ([place]) 10/29/2016  . Anemia   . Heme positive stool   . Ulceration of intestine   . Benign neoplasm of  transverse colon   . Acute gastrointestinal hemorrhage   . Esophageal candidiasis ([place])   . Angiodysplasia of intestinal tract   . Acute respiratory failure with hypoxia ([place]) 07/03/2016  . GI bleeding 06/24/2016  . Rectal bleeding 06/14/2016  . Anemia of chronic disease 06/01/2016  . MRSA carrier 06/01/2016  . Chronic renal failure 05/23/2016  . Ischemic heart disease 05/23/2016  . Angiodysplasia of small intestine   . Melena   . Small bowel bleed not requiring more than 4 units of blood in 24 hours, ICU, or surgery   . Anemia due to chronic blood loss   . Abdominal pain 05/05/2016  . Acute posthemorrhagic anemia 04/17/2016  . Gastrointestinal bleed 04/17/2016  . History of esophagogastroduodenoscopy (EGD) 04/17/2016  . Elevated troponin 04/17/2016  . Alcohol abuse 04/17/2016  . Upper GI bleed 01/19/2016  . Blood in stool   . Angiodysplasia of stomach and duodenum with hemorrhage   . Gastritis   . Esophagitis, unspecified   . GI bleed 05/16/2015  . Acute GI bleeding   . Symptomatic anemia 04/30/2015  . HTN (hypertension) 04/06/2015  . GERD (gastroesophageal reflux disease) 04/06/2015  . HLD (hyperlipidemia) 04/06/2015  . Dyspnea 04/06/2015  . Cirrhosis of liver with ascites ([place]) 04/06/2015  . Ascites 04/06/2015  . GIB (gastrointestinal bleeding) 03/23/2015  . Homicidal ideation 06/19/2013  . Suicidal intent 06/19/2013  . Homicidal ideations 06/19/2013  . Hyperkalemia 06/16/2013  . Mandible fracture (  [place]) 06/05/2013  . Fracture, mandible ([place]) 06/02/2013  . Coronary atherosclerosis of native coronary artery 06/02/2013  . ESRD on dialysis ([place]) 06/02/2013  . Mandible open fracture ([place]) 06/02/2013    Past Surgical History:  Procedure Laterality Date  . AGILE CAPSULE N/A 06/19/2016   Procedure: AGILE CAPSULE;  Surgeon: [name], MD;  Location: [facility] ENDOSCOPY;  Service: Endoscopy;  Laterality: N/A;  . COLONOSCOPY WITH PROPOFOL N/A 06/18/2016   Procedure: COLONOSCOPY  WITH PROPOFOL;  Surgeon: [name], MD;  Location: [facility] ENDOSCOPY;  Service: Endoscopy;  Laterality: N/A;  . COLONOSCOPY WITH PROPOFOL N/A 08/12/2016   Procedure: COLONOSCOPY WITH PROPOFOL;  Surgeon: [name], MD;  Location: [facility] ENDOSCOPY;  Service: Endoscopy;  Laterality: N/A;  . CORONARY ANGIOPLASTY  ?   PT UNABLE TO TELL IF  BEFORE OR AFTER  CABG  . CORONARY ARTERY BYPASS GRAFT  2008  ([place] , [place])   3 VESSEL  . DIALYSIS FISTULA CREATION  LAST SURGERY  APPOX  2008  . ENTEROSCOPY N/A 05/10/2016   Procedure: ENTEROSCOPY;  Surgeon: Jerene Bears, MD;  Location: [place];  Service: Gastroenterology;  Laterality: N/A;  . ENTEROSCOPY N/A 08/12/2016   Procedure: ENTEROSCOPY;  Surgeon: [name], MD;  Location: [facility] ENDOSCOPY;  Service: Endoscopy;  Laterality: N/A;  . ESOPHAGOGASTRODUODENOSCOPY N/A 05/07/2015   Procedure: ESOPHAGOGASTRODUODENOSCOPY (EGD);  Surgeon: Hulen Luster, MD;  Location: [facility] ENDOSCOPY;  Service: Endoscopy;  Laterality: N/A;  . ESOPHAGOGASTRODUODENOSCOPY (EGD) WITH PROPOFOL N/A 05/17/2015   Procedure: ESOPHAGOGASTRODUODENOSCOPY (EGD) WITH PROPOFOL;  Surgeon: [name], MD;  Location: [facility] ENDOSCOPY;  Service: Endoscopy;  Laterality: N/A;  . ESOPHAGOGASTRODUODENOSCOPY (EGD) WITH PROPOFOL N/A 01/20/2016   Procedure: ESOPHAGOGASTRODUODENOSCOPY (EGD) WITH PROPOFOL;  Surgeon: [name], MD;  Location: [facility] ENDOSCOPY;  Service: Endoscopy;  Laterality: N/A;  . ESOPHAGOGASTRODUODENOSCOPY (EGD) WITH PROPOFOL N/A 04/17/2016   Procedure: ESOPHAGOGASTRODUODENOSCOPY (EGD) WITH PROPOFOL;  Surgeon: Lin Landsman, MD;  Location: [facility] ENDOSCOPY;  Service: Gastroenterology;  Laterality: N/A;  . ESOPHAGOGASTRODUODENOSCOPY (EGD) WITH PROPOFOL  05/09/2016   Procedure: ESOPHAGOGASTRODUODENOSCOPY (EGD) WITH PROPOFOL;  Surgeon: Jerene Bears, MD;  Location: [place];  Service: Endoscopy;;  . ESOPHAGOGASTRODUODENOSCOPY (EGD) WITH PROPOFOL N/A 06/16/2016   Procedure: ESOPHAGOGASTRODUODENOSCOPY  (EGD) WITH PROPOFOL;  Surgeon: [name], MD;  Location: [facility] ENDOSCOPY;  Service: Endoscopy;  Laterality: N/A;  . GIVENS CAPSULE STUDY N/A 05/07/2016   Procedure: GIVENS CAPSULE STUDY;  Surgeon: Doran Stabler, MD;  Location: [place];  Service: Endoscopy;  Laterality: N/A;  . MANDIBULAR HARDWARE REMOVAL N/A 07/29/2013   Procedure: REMOVAL OF ARCH BARS;  Surgeon: Theodoro Kos, DO;  Location: [place];  Service: Plastics;  Laterality: N/A;  . ORIF MANDIBULAR FRACTURE N/A 06/05/2013   Procedure: REPAIR OF MANDIBULAR FRACTURE x 2 with maxillo-mandibular fixation ;  Surgeon: Theodoro Kos, DO;  Location: [place];  Service: Plastics;  Laterality: N/A;  . PERIPHERAL ARTERIAL STENT GRAFT Left     Prior to Admission medications   Medication Sig Start Date End Date Taking? Authorizing Provider  albuterol (PROVENTIL HFA;VENTOLIN HFA) 108 (90 Base) MCG/ACT inhaler Inhale 2 puffs into the lungs every 6 (six) hours as needed for wheezing or shortness of breath. 02/19/17   Darel Hong, MD  alum & mag hydroxide-simeth (MAALOX/MYLANTA) 200-200-20 MG/5ML suspension Take 15 mLs every 4 (four) hours as needed by mouth for indigestion or heartburn. 01/25/17   Demetrios Loll, MD  amLODipine (NORVASC) 5 MG tablet Take 5 mg by mouth daily.    [provider]  atorvastatin (LIPITOR) 40 MG tablet Take 1 tablet (40 mg total) by mouth daily. 11/16/16   Vaughan Basta, MD  benazepril (LOTENSIN) 10 MG tablet Take 1 tablet by mouth daily.    [provider]  budesonide-formoterol (SYMBICORT) 160-4.5 MCG/ACT inhaler Inhale 2 puffs into the lungs daily. 11/16/16   Vaughan Basta, MD  calcium acetate (PHOSLO) 667 MG capsule Take 2,001 mg by mouth 3 (three) times daily.    [provider]  cinacalcet (SENSIPAR) 30 MG tablet Take 1 tablet by mouth daily.    [provider]  ferrous sulfate 325 (65 FE) MG tablet Take 1 tablet by mouth 3 (three) times daily.  01/21/17   [provider]  furosemide (LASIX) 80 MG tablet Take 1 tablet (80 mg total) daily by mouth. 01/25/17   Demetrios Loll, MD  gabapentin (NEURONTIN) 300 MG capsule TAKE 1 CAPSULE BY MOUTH EVERY EVENING AND 1 EXTRA CAP ON DIALYSIS DAYS AS NEEDED FOR NERVE PAIN 08/16/16   [provider]  ipratropium-albuterol (DUONEB) 0.5-2.5 (3) MG/3ML SOLN Take 3 mLs by nebulization every 6 (six) hours as needed. 11/28/16   Loletha Grayer, MD  labetalol (NORMODYNE) 100 MG tablet Take 1 tablet (100 mg total) by mouth daily. 11/16/16   Vaughan Basta, MD  lactulose (CHRONULAC) 10 GM/15ML solution Take 30 mLs (20 g total) by mouth 3 (three) times daily. 01/11/17   Bettey Costa, MD  Multiple Vitamins-Minerals-FA (DIALYVITE SUPREME D) 3 MG TABS Take 1 tablet (3 mg total) by mouth daily. 11/28/16   Loletha Grayer, MD  nicotine (NICODERM CQ - DOSED IN MG/24 HOURS) 21 mg/24hr patch Place 1 patch (21 mg total) onto the skin daily. Patient not taking: Reported on 03/24/2017 01/12/17   Bettey Costa, MD  nitroGLYCERIN (NITROSTAT) 0.4 MG SL tablet Place 1 tablet (0.4 mg total) under the tongue every 5 (five) minutes as needed. 04/12/16   Wende Bushy, MD  pantoprazole (PROTONIX) 40 MG tablet Take 1 tablet (40 mg total) by mouth daily. 11/28/16   Loletha Grayer, MD  sevelamer carbonate (RENVELA) 800 MG tablet Take 3 tablets (2,400 mg total) by mouth 3 (three) times daily with meals. 11/16/16   Vaughan Basta, MD  Spacer/Aero Chamber Mouthpiece MISC 1 Units by Does not apply route every 4 (four) hours as needed (wheezing). 02/19/17   Darel Hong, MD  tiotropium (SPIRIVA HANDIHALER) 18 MCG inhalation capsule Place 1 capsule (18 mcg total) into inhaler and inhale daily. 11/16/16 11/16/17  Vaughan Basta, MD    Allergies Patient has no known allergies.  Family History  Problem Relation Age of Onset  . Colon cancer Mother   . Cancer Father   . Cancer Sister   . Kidney disease Brother      Social History Social History   Tobacco Use  . Smoking status: Current Every Day Smoker    Packs/day: 0.15    Years: 40.00    Pack years: 6.00    Types: Cigarettes  . Smokeless tobacco: Never Used  Substance Use Topics  . Alcohol use: No    Comment: pt reports quitting after learning about cirrhosis  . Drug use: No    Review of Systems  Constitutional: No fever/chills Eyes: No visual changes. ENT: No sore throat. Cardiovascular: Denies chest pain. Respiratory: Denies shortness of breath. Gastrointestinal: No abdominal pain.  No nausea, no vomiting.  No diarrhea.  No constipation. Genitourinary: Negative for dysuria. Musculoskeletal: Negative for back pain. Skin: Negative for rash. Neurological: Negative for headaches, focal weakness or  numbness.    ____________________________________________   PHYSICAL EXAM:  VITAL SIGNS: ED Triage Vitals  Enc Vitals Group     BP 03/27/17 0839 137/79     Pulse Rate 03/27/17 0839 84     Resp 03/27/17 0839 14     Temp 03/27/17 0839 98.2 F (36.8 C)     Temp Source 03/27/17 0839 Oral     SpO2 03/27/17 0839 100 %     Weight 03/27/17 0840 165 lb (74.8 kg)     Height 03/27/17 0840 6\' 3"  (1.905 m)     Head Circumference --      Peak Flow --      Pain Score --      Pain Loc --      Pain Edu? --      Excl. in [place]? --     Constitutional: Alert and oriented. Well appearing and in no acute distress. Eyes: Conjunctivae are normal.  Head: Atraumatic. Nose: No congestion/rhinnorhea. Mouth/Throat: Mucous membranes are moist.  Neck: No stridor.   Cardiovascular: Normal rate, regular rhythm. Grossly normal heart sounds.  Good peripheral circulation with palpable thrill to the right upper extremity dialysis fistula. Respiratory: Normal respiratory effort.  No retractions. Lungs CTAB. Gastrointestinal: Abdominal distention with mild and diffuse tenderness to palpation which the patient says is consistent with his ascites when it  needs to be drained out.  No rigidity or rebound. No distention. Musculoskeletal: Mild bilateral lower extremity edema. Neurologic:  Normal speech and language. No gross focal neurologic deficits are appreciated. Skin:  Skin is warm, dry and intact. No rash noted. Psychiatric: Mood and affect are normal. Speech and behavior are normal.  ____________________________________________   LABS (all labs ordered are listed, but only abnormal results are displayed)  Labs Reviewed  RENAL FUNCTION PANEL   ____________________________________________  EKG   ____________________________________________  RADIOLOGY   ____________________________________________   PROCEDURES  Procedure(s) performed:   Procedures  Critical Care performed:   ____________________________________________   INITIAL IMPRESSION / ASSESSMENT AND PLAN / ED COURSE  Pertinent labs & imaging results that were available during my care of the patient were reviewed by me and considered in my medical decision making (see chart for details).  DDX: Routine dialysis, hyperkalemia, chronic renal failure, abdominal ascites As part of my medical decision making, I reviewed the following data within the [facility] Notes from prior ED visits  ----------------------------------------- 9:59 AM on 03/27/2017 -----------------------------------------  I discussed the case with Dr. [name] of nephrology who says that the patient will be dialyzed and return to the emergency department once completed.   ----------------------------------------- 1:25 PM on 03/27/2017 -----------------------------------------  Patient back from dialysis continues to have no complaints.  Will be discharged at this time so we can make his paracentesis appointment.  He is understanding of the plan willing to comply.  ____________________________________________   FINAL CLINICAL IMPRESSION(S) / ED DIAGNOSES  Chronic kidney  disease    NEW MEDICATIONS STARTED DURING THIS VISIT:  New Prescriptions   No medications on file     Note:  This document was prepared using Dragon voice recognition software and may include unintentional dictation errors.     Orbie Pyo, MD 03/27/17 [phone]

## 2017-03-27 NOTE — ED Notes (Signed)
Pt transported to dialysis

## 2017-03-27 NOTE — Procedures (Signed)
PROCEDURE SUMMARY:  Successful US guided paracentesis from right lateral abdomen.  Yielded 6.6 liters of amber fluid.  No immediate complications.  Pt tolerated well.   Specimen was sent for labs.  Docia Barrier PA-C 03/27/2017 4:17 PM

## 2017-03-27 NOTE — ED Notes (Signed)
This RN spoke with dialysis RN and they will work him in as soon as they can. Also spoke with Dr. [name] and he reports that pt only needs to be triaged with vital signs, no labs needed. Pt also has a 1pm appt for paracentesis. Charge aware. Pt in NAD waiting out in lobby.

## 2017-03-27 NOTE — Progress Notes (Signed)
PRE HD ASSESSMENT 

## 2017-03-27 NOTE — ED Notes (Signed)
Spoke to pt. Asked if he needed a warm blanket, he refused. Ask him if he needed anything else and he said no. Pt resting comfortably.

## 2017-03-27 NOTE — Progress Notes (Signed)
[facility], [place] 03/27/17  Subjective:     HEMODIALYSIS FLOWSHEET:  Blood Flow Rate (mL/min): 400 mL/min Arterial Pressure (mmHg): -170 mmHg Venous Pressure (mmHg): 150 mmHg Transmembrane Pressure (mmHg): 60 mmHg Ultrafiltration Rate (mL/min): 1760 mL/min Dialysate Flow Rate (mL/min): 800 ml/min Conductivity: Machine : 14 Conductivity: Machine : 14 Dialysis Fluid Bolus: Normal Saline Bolus Amount (mL): 250 mL  Patient presented to ER for dialysis Denies any acute c/o Reports abdominal distention  Objective:  Vital signs in last 24 hours:  Temp:  [98.2 F (36.8 C)] 98.2 F (36.8 C) (01/14 1000) Pulse Rate:  [83-89] 89 (01/14 1351) Resp:  [14-20] 16 (01/14 1329) BP: (108-158)/(52-80) 142/80 (01/14 1351) SpO2:  [98 %-100 %] 98 % (01/14 1351) Weight:  [74.8 kg (164 lb 14.5 oz)-74.8 kg (165 lb)] 74.8 kg (164 lb 14.5 oz) (01/14 1000)  Weight change:  Filed Weights   03/27/17 0840 03/27/17 1000  Weight: 74.8 kg (165 lb) 74.8 kg (164 lb 14.5 oz)    Intake/Output:   No intake or output data in the 24 hours ending 03/27/17 1354   Physical Exam: General: NAD laying in bed  HEENT Moist oral mucus membranes  Neck supple  Pulm/lungs clear  CVS/Heart regular  Abdomen:  Soft, distended   Extremities: + edem  Neurologic: Alert, oriented  Skin: No acute rashes  Access: Arm AVF       Basic Metabolic Panel:  Recent Labs  Lab 03/24/17 0919  NA 135  K 6.8*  CL 92*  CO2 31  GLUCOSE 86  BUN 72*  CREATININE 12.97*  CALCIUM 8.0*     CBC: Recent Labs  Lab 03/24/17 0919  WBC 8.0  NEUTROABS 6.1  HGB 9.4*  HCT 30.1*  MCV 85.9  PLT 373      Lab Results  Component Value Date   HEPBSAG Negative 03/01/2017   HEPBSAB Reactive 03/01/2017   HEPBIGM Negative 06/01/2016      Microbiology:  No results found for this or any previous visit (from the past 240 hour(s)).  Coagulation Studies: No results for input(s): LABPROT,  INR in the last 72 hours.  Urinalysis: No results for input(s): COLORURINE, LABSPEC, PHURINE, GLUCOSEU, HGBUR, BILIRUBINUR, KETONESUR, PROTEINUR, UROBILINOGEN, NITRITE, LEUKOCYTESUR in the last 72 hours.  Invalid input(s): APPERANCEUR    Imaging: No results found.   Medications:       Assessment/ Plan:  57 y.o. male with end stage renal disease on hemodialysis secondary to [place]'s syndrome, ascites, hypertension, anemia of chronic kidney disease, coronary artery disease, peripheral vascular disease, hyperlipidemia, gastrointestinal AVMs, congestive heart failure  [name] AVF- no outpatient dialysis unit currently  1. Hyperkalemia 2. Anemia of chronic kidney disease 3. Secondary Hyperparathyroidism 4. Ascites 5. Hypertension 6. ESRD  Dialysis today 2 K bath; fluid removal as tolerated upto 3 L Tolerating well Counseled re fluid restriction and avoid high K foods. Patient admits to drinking orange juice and milk To return to ER after HD    LOS: 0 Falicity Sheets [name] 1/14/20191:54 PM  [place], [place]

## 2017-03-27 NOTE — ED Triage Notes (Signed)
Arrives for outpatient dialysis treatment.  Patient also has 1300 scheduled paracentesis.Marland Kitchen    AAOx3.  Skin warm and dry.NAD

## 2017-03-29 ENCOUNTER — Encounter: Payer: Self-pay | Admitting: Emergency Medicine

## 2017-03-29 ENCOUNTER — Emergency Department
Admission: EM | Admit: 2017-03-29 | Discharge: 2017-03-29 | Discharge status: Against Medical Advice | Payer: Medicare Other | Source: Home / Self Care | Attending: Emergency Medicine | Admitting: Emergency Medicine

## 2017-03-29 DIAGNOSIS — N186 End stage renal disease: Secondary | ICD-10-CM

## 2017-03-29 DIAGNOSIS — Z79899 Other long term (current) drug therapy: Secondary | ICD-10-CM | POA: Insufficient documentation

## 2017-03-29 DIAGNOSIS — E1122 Type 2 diabetes mellitus with diabetic chronic kidney disease: Secondary | ICD-10-CM | POA: Insufficient documentation

## 2017-03-29 DIAGNOSIS — I2510 Atherosclerotic heart disease of native coronary artery without angina pectoris: Secondary | ICD-10-CM

## 2017-03-29 DIAGNOSIS — Z992 Dependence on renal dialysis: Secondary | ICD-10-CM

## 2017-03-29 DIAGNOSIS — F17210 Nicotine dependence, cigarettes, uncomplicated: Secondary | ICD-10-CM | POA: Insufficient documentation

## 2017-03-29 DIAGNOSIS — I132 Hypertensive heart and chronic kidney disease with heart failure and with stage 5 chronic kidney disease, or end stage renal disease: Secondary | ICD-10-CM

## 2017-03-29 DIAGNOSIS — Z4931 Encounter for adequacy testing for hemodialysis: Secondary | ICD-10-CM | POA: Diagnosis not present

## 2017-03-29 DIAGNOSIS — I509 Heart failure, unspecified: Secondary | ICD-10-CM | POA: Insufficient documentation

## 2017-03-29 DIAGNOSIS — Z5329 Procedure and treatment not carried out because of patient's decision for other reasons: Secondary | ICD-10-CM

## 2017-03-29 DIAGNOSIS — J449 Chronic obstructive pulmonary disease, unspecified: Secondary | ICD-10-CM

## 2017-03-29 DIAGNOSIS — D123 Benign neoplasm of transverse colon: Secondary | ICD-10-CM

## 2017-03-29 NOTE — ED Notes (Signed)
Pt notified up waiting for dialysis at 12noon to call for pt

## 2017-03-29 NOTE — ED Notes (Signed)
Pt left and stated to RN Caryl Pina he didn't have time for this and to wait for dialysis.

## 2017-03-29 NOTE — ED Notes (Signed)
Dialysis called and pt will be able to go around 1200 for HD

## 2017-03-29 NOTE — ED Provider Notes (Addendum)
[facility] Emergency Department Provider Note  ____________________________________________  Time seen: Approximately 9:11 AM  I have reviewed the triage vital signs and the nursing notes.   HISTORY  Chief Complaint Needs Dialysis   HPI Stanley [name] is a 57 y.o. male with h/o ESRD on HA (MWF) who presents for his dialysis treatment. Patient has been fired from several dialysis centers and presents three times a week to our department for dialysis. He denies leg swelling, CP, SOB. Last HD was 2 days ago. No recent illness.   Past Medical History:  Diagnosis Date  . Alcohol abuse   . CHF (congestive heart failure) ([place])   . Cirrhosis ([place])   . Coronary artery disease 2009  . Diabetic peripheral neuropathy ([place])   . Drug abuse ([place])   . End stage renal disease on dialysis [facility]) NEPHROLOGIST-   DR [facility]  IN [place]   HEMODIALYSIS --   TUES/  THURS/  SAT  . GERD (gastroesophageal reflux disease)   . Hyperlipidemia   . Hypertension   . PAD (peripheral artery disease) ([place])   . Renal insufficiency    Per pt, 32 oz fluid restriction per day  . S/P triple vessel bypass 06/09/2016   2009ish  . Suicidal ideation    & HOMICIDAL IDEATION --  06-16-2013   ADMITTED TO BEHAVIOR HEALTH    Patient Active Problem List   Diagnosis Date Noted  . Uremia 03/08/2017  . Hemodialysis patient ([place]) 03/03/2017  . Weakness 02/28/2017  . Hypocalcemia 02/22/2017  . Shortness of breath 11/26/2016  . COPD (chronic obstructive pulmonary disease) ([place]) 10/30/2016  . COPD exacerbation ([place]) 10/29/2016  . Anemia   . Heme positive stool   . Ulceration of intestine   . Benign neoplasm of transverse colon   . Acute gastrointestinal hemorrhage   . Esophageal candidiasis ([place])   . Angiodysplasia of intestinal tract   . Acute respiratory failure with hypoxia ([place]) 07/03/2016  . GI bleeding 06/24/2016  . Rectal bleeding 06/14/2016  . Anemia of chronic disease  06/01/2016  . MRSA carrier 06/01/2016  . Chronic renal failure 05/23/2016  . Ischemic heart disease 05/23/2016  . Angiodysplasia of small intestine   . Melena   . Small bowel bleed not requiring more than 4 units of blood in 24 hours, ICU, or surgery   . Anemia due to chronic blood loss   . Abdominal pain 05/05/2016  . Acute posthemorrhagic anemia 04/17/2016  . Gastrointestinal bleed 04/17/2016  . History of esophagogastroduodenoscopy (EGD) 04/17/2016  . Elevated troponin 04/17/2016  . Alcohol abuse 04/17/2016  . Upper GI bleed 01/19/2016  . Blood in stool   . Angiodysplasia of stomach and duodenum with hemorrhage   . Gastritis   . Esophagitis, unspecified   . GI bleed 05/16/2015  . Acute GI bleeding   . Symptomatic anemia 04/30/2015  . HTN (hypertension) 04/06/2015  . GERD (gastroesophageal reflux disease) 04/06/2015  . HLD (hyperlipidemia) 04/06/2015  . Dyspnea 04/06/2015  . Cirrhosis of liver with ascites ([place]) 04/06/2015  . Ascites 04/06/2015  . GIB (gastrointestinal bleeding) 03/23/2015  . Homicidal ideation 06/19/2013  . Suicidal intent 06/19/2013  . Homicidal ideations 06/19/2013  . Hyperkalemia 06/16/2013  . Mandible fracture ([place]) 06/05/2013  . Fracture, mandible ([place]) 06/02/2013  . Coronary atherosclerosis of native coronary artery 06/02/2013  . ESRD on dialysis ([place]) 06/02/2013  . Mandible open fracture ([place]) 06/02/2013    Past Surgical History:  Procedure Laterality Date  . AGILE CAPSULE  N/A 06/19/2016   Procedure: AGILE CAPSULE;  Surgeon: [name], MD;  Location[facility] ENDOSCOPY;  Service: Endoscopy;  Laterality: N/A;  . COLONOSCOPY WITH PROPOFOL N/A 06/18/2016   Procedure: COLONOSCOPY WITH PROPOFOL;  Surgeon: [name], MD;  Location: [facility] ENDOSCOPY;  Service: Endoscopy;  Laterality: N/A;  . COLONOSCOPY WITH PROPOFOL N/A 08/12/2016   Procedure: COLONOSCOPY WITH PROPOFOL;  Surgeon: [name], MD;  Location: [facility] ENDOSCOPY;  Service: Endoscopy;  Laterality:  N/A;  . CORONARY ANGIOPLASTY  ?   PT UNABLE TO TELL IF  BEFORE OR AFTER  CABG  . CORONARY ARTERY BYPASS GRAFT  2008  ([place])   3 VESSEL  . DIALYSIS FISTULA CREATION  LAST SURGERY  APPOX  2008  . ENTEROSCOPY N/A 05/10/2016   Procedure: ENTEROSCOPY;  Surgeon: Jerene Bears, MD;  Location: [place];  Service: Gastroenterology;  Laterality: N/A;  . ENTEROSCOPY N/A 08/12/2016   Procedure: ENTEROSCOPY;  Surgeon: [name], MD;  Location: [facility] ENDOSCOPY;  Service: Endoscopy;  Laterality: N/A;  . ESOPHAGOGASTRODUODENOSCOPY N/A 05/07/2015   Procedure: ESOPHAGOGASTRODUODENOSCOPY (EGD);  Surgeon: Hulen Luster, MD;  Location: [facility] ENDOSCOPY;  Service: Endoscopy;  Laterality: N/A;  . ESOPHAGOGASTRODUODENOSCOPY (EGD) WITH PROPOFOL N/A 05/17/2015   Procedure: ESOPHAGOGASTRODUODENOSCOPY (EGD) WITH PROPOFOL;  Surgeon: [name], MD;  Location: [facility] ENDOSCOPY;  Service: Endoscopy;  Laterality: N/A;  . ESOPHAGOGASTRODUODENOSCOPY (EGD) WITH PROPOFOL N/A 01/20/2016   Procedure: ESOPHAGOGASTRODUODENOSCOPY (EGD) WITH PROPOFOL;  Surgeon: [name], MD;  Location: [facility] ENDOSCOPY;  Service: Endoscopy;  Laterality: N/A;  . ESOPHAGOGASTRODUODENOSCOPY (EGD) WITH PROPOFOL N/A 04/17/2016   Procedure: ESOPHAGOGASTRODUODENOSCOPY (EGD) WITH PROPOFOL;  Surgeon: Lin Landsman, MD;  Location: [facility] ENDOSCOPY;  Service: Gastroenterology;  Laterality: N/A;  . ESOPHAGOGASTRODUODENOSCOPY (EGD) WITH PROPOFOL  05/09/2016   Procedure: ESOPHAGOGASTRODUODENOSCOPY (EGD) WITH PROPOFOL;  Surgeon: Jerene Bears, MD;  Location: [place];  Service: Endoscopy;;  . ESOPHAGOGASTRODUODENOSCOPY (EGD) WITH PROPOFOL N/A 06/16/2016   Procedure: ESOPHAGOGASTRODUODENOSCOPY (EGD) WITH PROPOFOL;  Surgeon: [name], MD;  Location: [facility] ENDOSCOPY;  Service: Endoscopy;  Laterality: N/A;  . GIVENS CAPSULE STUDY N/A 05/07/2016   Procedure: GIVENS CAPSULE STUDY;  Surgeon: Doran Stabler, MD;  Location: [place];  Service: Endoscopy;   Laterality: N/A;  . MANDIBULAR HARDWARE REMOVAL N/A 07/29/2013   Procedure: REMOVAL OF ARCH BARS;  Surgeon: Theodoro Kos, DO;  Location: [place];  Service: Plastics;  Laterality: N/A;  . ORIF MANDIBULAR FRACTURE N/A 06/05/2013   Procedure: REPAIR OF MANDIBULAR FRACTURE x 2 with maxillo-mandibular fixation ;  Surgeon: Theodoro Kos, DO;  Location: [place];  Service: Plastics;  Laterality: N/A;  . PERIPHERAL ARTERIAL STENT GRAFT Left     Prior to Admission medications   Medication Sig Start Date End Date Taking? Authorizing Provider  albuterol (PROVENTIL HFA;VENTOLIN HFA) 108 (90 Base) MCG/ACT inhaler Inhale 2 puffs into the lungs every 6 (six) hours as needed for wheezing or shortness of breath. 02/19/17   Darel Hong, MD  alum & mag hydroxide-simeth (MAALOX/MYLANTA) 200-200-20 MG/5ML suspension Take 15 mLs every 4 (four) hours as needed by mouth for indigestion or heartburn. 01/25/17   Demetrios Loll, MD  amLODipine (NORVASC) 5 MG tablet Take 5 mg by mouth daily.    [provider]  atorvastatin (LIPITOR) 40 MG tablet Take 1 tablet (40 mg total) by mouth daily. 11/16/16   Vaughan Basta, MD  benazepril (LOTENSIN) 10 MG tablet Take 1 tablet by mouth daily.    [provider]  budesonide-formoterol (SYMBICORT) 160-4.5 MCG/ACT inhaler  Inhale 2 puffs into the lungs daily. 11/16/16   Vaughan Basta, MD  calcium acetate (PHOSLO) 667 MG capsule Take 2,001 mg by mouth 3 (three) times daily.    [provider]  cinacalcet (SENSIPAR) 30 MG tablet Take 1 tablet by mouth daily.    [provider]  ferrous sulfate 325 (65 FE) MG tablet Take 1 tablet by mouth 3 (three) times daily. 01/21/17   [provider]  furosemide (LASIX) 80 MG tablet Take 1 tablet (80 mg total) daily by mouth. 01/25/17   Demetrios Loll, MD  gabapentin (NEURONTIN) 300 MG capsule TAKE 1 CAPSULE BY MOUTH EVERY EVENING AND 1 EXTRA CAP ON DIALYSIS DAYS AS NEEDED FOR NERVE  PAIN 08/16/16   [provider]  ipratropium-albuterol (DUONEB) 0.5-2.5 (3) MG/3ML SOLN Take 3 mLs by nebulization every 6 (six) hours as needed. 11/28/16   Loletha Grayer, MD  labetalol (NORMODYNE) 100 MG tablet Take 1 tablet (100 mg total) by mouth daily. 11/16/16   Vaughan Basta, MD  lactulose (CHRONULAC) 10 GM/15ML solution Take 30 mLs (20 g total) by mouth 3 (three) times daily. 01/11/17   Bettey Costa, MD  Multiple Vitamins-Minerals-FA (DIALYVITE SUPREME D) 3 MG TABS Take 1 tablet (3 mg total) by mouth daily. 11/28/16   Loletha Grayer, MD  nicotine (NICODERM CQ - DOSED IN MG/24 HOURS) 21 mg/24hr patch Place 1 patch (21 mg total) onto the skin daily. Patient not taking: Reported on 03/24/2017 01/12/17   Bettey Costa, MD  nitroGLYCERIN (NITROSTAT) 0.4 MG SL tablet Place 1 tablet (0.4 mg total) under the tongue every 5 (five) minutes as needed. 04/12/16   Wende Bushy, MD  pantoprazole (PROTONIX) 40 MG tablet Take 1 tablet (40 mg total) by mouth daily. 11/28/16   Loletha Grayer, MD  sevelamer carbonate (RENVELA) 800 MG tablet Take 3 tablets (2,400 mg total) by mouth 3 (three) times daily with meals. 11/16/16   Vaughan Basta, MD  Spacer/Aero Chamber Mouthpiece MISC 1 Units by Does not apply route every 4 (four) hours as needed (wheezing). 02/19/17   Darel Hong, MD  tiotropium (SPIRIVA HANDIHALER) 18 MCG inhalation capsule Place 1 capsule (18 mcg total) into inhaler and inhale daily. 11/16/16 11/16/17  Vaughan Basta, MD    Allergies Patient has no known allergies.  Family History  Problem Relation Age of Onset  . Colon cancer Mother   . Cancer Father   . Cancer Sister   . Kidney disease Brother     Social History Social History   Tobacco Use  . Smoking status: Current Every Day Smoker    Packs/day: 0.15    Years: 40.00    Pack years: 6.00    Types: Cigarettes  . Smokeless tobacco: Never Used  Substance Use Topics  . Alcohol use: No    Comment: pt  reports quitting after learning about cirrhosis  . Drug use: No    Review of Systems  Constitutional: Negative for fever. Eyes: Negative for visual changes. ENT: Negative for sore throat. Neck: No neck pain  Cardiovascular: Negative for chest pain. Respiratory: Negative for shortness of breath. Gastrointestinal: Negative for abdominal pain, vomiting or diarrhea. Genitourinary: Negative for dysuria. Musculoskeletal: Negative for back pain. Skin: Negative for rash. Neurological: Negative for headaches, weakness or numbness. Psych: No SI or HI  ____________________________________________   PHYSICAL EXAM:  VITAL SIGNS: ED Triage Vitals  Enc Vitals Group     BP 03/29/17 0834 120/60     Pulse Rate 03/29/17 0834 83  Resp 03/29/17 0834 18     Temp 03/29/17 0834 98.2 F (36.8 C)     Temp Source 03/29/17 0834 Oral     SpO2 03/29/17 0834 99 %     Weight --      Height --      Head Circumference --      Peak Flow --      Pain Score 03/29/17 0850 0     Pain Loc --      Pain Edu? --      Excl. in [place]? --     Constitutional: Alert and oriented. Well appearing and in no apparent distress. HEENT:      Head: Normocephalic and atraumatic.         Eyes: Conjunctivae are normal. Sclera is non-icteric.       Mouth/Throat: Mucous membranes are moist.       Neck: Supple with no signs of meningismus. Cardiovascular: Regular rate and rhythm. No murmurs, gallops, or rubs. 2+ symmetrical distal pulses are present in all extremities. No JVD. Respiratory: Normal respiratory effort. Lungs are clear to auscultation bilaterally. No wheezes, crackles, or rhonchi.  Gastrointestinal: Soft, non tender, and non distended with positive bowel sounds. No rebound or guarding. Musculoskeletal: No edema, cyanosis, or erythema of extremities. Neurologic: Normal speech and language. Face is symmetric. Moving all extremities. No gross focal neurologic deficits are appreciated. Psychiatric: Mood and affect  are normal. Speech and behavior are normal.  ____________________________________________   LABS (all labs ordered are listed, but only abnormal results are displayed)  Labs Reviewed - No data to display ____________________________________________  EKG  none  ____________________________________________  RADIOLOGY  none  ____________________________________________   PROCEDURES  Procedure(s) performed: None Procedures Critical Care performed:  None ____________________________________________   INITIAL IMPRESSION / ASSESSMENT AND PLAN / ED COURSE  57 y.o. male with h/o ESRD on HA (MWF) who presents for his dialysis treatment. Discussed with Dr. [name] who will dialyze patient.     _________________________ 12:11 PM on 03/29/2017 -----------------------------------------  Patient was made aware at 9:30AM that bed would only be available at 12:30 PM today for HD. Waited in the ED until 20 minutes before 12:30PM and then eloped. Patient seen leaving the ED, walking with no difficulty and no distress.   As part of my medical decision making, I reviewed the following data within the [facility] notes reviewed and incorporated, A consult was requested and obtained from this/these consultant(s) Nephrology, Notes from prior ED visits and [place] Controlled Substance Database    Pertinent labs & imaging results that were available during my care of the patient were reviewed by me and considered in my medical decision making (see chart for details).    ____________________________________________   FINAL CLINICAL IMPRESSION(S) / ED DIAGNOSES  Final diagnoses:  Encounter for dialysis [facility])      NEW MEDICATIONS STARTED DURING THIS VISIT:  ED Discharge Orders    None       Note:  This document was prepared using Dragon voice recognition software and may include unintentional dictation errors.    Rudene Re, MD 03/29/17 Susan Moore, [place], MD 03/29/17 1213

## 2017-03-29 NOTE — ED Triage Notes (Signed)
Pt to ED for dialysis treatment. Pt has care plan.

## 2017-03-31 DIAGNOSIS — R9431 Abnormal electrocardiogram [ECG] [EKG]: Secondary | ICD-10-CM | POA: Diagnosis not present

## 2017-03-31 DIAGNOSIS — I459 Conduction disorder, unspecified: Secondary | ICD-10-CM | POA: Diagnosis not present

## 2017-03-31 DIAGNOSIS — I739 Peripheral vascular disease, unspecified: Secondary | ICD-10-CM | POA: Diagnosis not present

## 2017-03-31 DIAGNOSIS — I2510 Atherosclerotic heart disease of native coronary artery without angina pectoris: Secondary | ICD-10-CM | POA: Diagnosis not present

## 2017-03-31 DIAGNOSIS — I340 Nonrheumatic mitral (valve) insufficiency: Secondary | ICD-10-CM | POA: Diagnosis not present

## 2017-03-31 DIAGNOSIS — Z79899 Other long term (current) drug therapy: Secondary | ICD-10-CM | POA: Diagnosis not present

## 2017-03-31 DIAGNOSIS — F17210 Nicotine dependence, cigarettes, uncomplicated: Secondary | ICD-10-CM | POA: Diagnosis not present

## 2017-03-31 DIAGNOSIS — Z951 Presence of aortocoronary bypass graft: Secondary | ICD-10-CM | POA: Diagnosis not present

## 2017-03-31 DIAGNOSIS — Z792 Long term (current) use of antibiotics: Secondary | ICD-10-CM | POA: Diagnosis not present

## 2017-03-31 DIAGNOSIS — I5040 Unspecified combined systolic (congestive) and diastolic (congestive) heart failure: Secondary | ICD-10-CM | POA: Diagnosis not present

## 2017-03-31 DIAGNOSIS — Q8781 Alport syndrome: Secondary | ICD-10-CM | POA: Diagnosis not present

## 2017-03-31 DIAGNOSIS — I2722 Pulmonary hypertension due to left heart disease: Secondary | ICD-10-CM | POA: Diagnosis not present

## 2017-03-31 DIAGNOSIS — Z7982 Long term (current) use of aspirin: Secondary | ICD-10-CM | POA: Diagnosis not present

## 2017-03-31 DIAGNOSIS — N186 End stage renal disease: Secondary | ICD-10-CM | POA: Diagnosis not present

## 2017-03-31 DIAGNOSIS — I420 Dilated cardiomyopathy: Secondary | ICD-10-CM | POA: Diagnosis not present

## 2017-03-31 DIAGNOSIS — Z8631 Personal history of diabetic foot ulcer: Secondary | ICD-10-CM | POA: Diagnosis not present

## 2017-03-31 DIAGNOSIS — R188 Other ascites: Secondary | ICD-10-CM | POA: Diagnosis not present

## 2017-03-31 DIAGNOSIS — I700 Atherosclerosis of aorta: Secondary | ICD-10-CM | POA: Diagnosis not present

## 2017-04-01 ENCOUNTER — Inpatient Hospital Stay
Admission: EM | Admit: 2017-04-01 | Discharge: 2017-04-03 | DRG: 640 | Discharge status: Against Medical Advice | Payer: Medicare Other | Attending: Internal Medicine | Admitting: Internal Medicine

## 2017-04-01 ENCOUNTER — Other Ambulatory Visit: Payer: Self-pay

## 2017-04-01 ENCOUNTER — Emergency Department: Payer: Medicare Other

## 2017-04-01 ENCOUNTER — Encounter: Payer: Self-pay | Admitting: Emergency Medicine

## 2017-04-01 DIAGNOSIS — J449 Chronic obstructive pulmonary disease, unspecified: Secondary | ICD-10-CM | POA: Diagnosis present

## 2017-04-01 DIAGNOSIS — N2581 Secondary hyperparathyroidism of renal origin: Secondary | ICD-10-CM | POA: Diagnosis not present

## 2017-04-01 DIAGNOSIS — F1010 Alcohol abuse, uncomplicated: Secondary | ICD-10-CM | POA: Diagnosis present

## 2017-04-01 DIAGNOSIS — Z7951 Long term (current) use of inhaled steroids: Secondary | ICD-10-CM

## 2017-04-01 DIAGNOSIS — Q8781 Alport syndrome: Secondary | ICD-10-CM

## 2017-04-01 DIAGNOSIS — R079 Chest pain, unspecified: Secondary | ICD-10-CM | POA: Diagnosis not present

## 2017-04-01 DIAGNOSIS — E1122 Type 2 diabetes mellitus with diabetic chronic kidney disease: Secondary | ICD-10-CM | POA: Diagnosis present

## 2017-04-01 DIAGNOSIS — R188 Other ascites: Secondary | ICD-10-CM | POA: Diagnosis present

## 2017-04-01 DIAGNOSIS — Z992 Dependence on renal dialysis: Secondary | ICD-10-CM | POA: Diagnosis not present

## 2017-04-01 DIAGNOSIS — E875 Hyperkalemia: Secondary | ICD-10-CM | POA: Diagnosis not present

## 2017-04-01 DIAGNOSIS — Z79899 Other long term (current) drug therapy: Secondary | ICD-10-CM

## 2017-04-01 DIAGNOSIS — K219 Gastro-esophageal reflux disease without esophagitis: Secondary | ICD-10-CM | POA: Diagnosis present

## 2017-04-01 DIAGNOSIS — E1151 Type 2 diabetes mellitus with diabetic peripheral angiopathy without gangrene: Secondary | ICD-10-CM | POA: Diagnosis present

## 2017-04-01 DIAGNOSIS — N186 End stage renal disease: Secondary | ICD-10-CM | POA: Diagnosis present

## 2017-04-01 DIAGNOSIS — E785 Hyperlipidemia, unspecified: Secondary | ICD-10-CM | POA: Diagnosis present

## 2017-04-01 DIAGNOSIS — I959 Hypotension, unspecified: Secondary | ICD-10-CM | POA: Diagnosis present

## 2017-04-01 DIAGNOSIS — D649 Anemia, unspecified: Secondary | ICD-10-CM | POA: Diagnosis not present

## 2017-04-01 DIAGNOSIS — Z841 Family history of disorders of kidney and ureter: Secondary | ICD-10-CM | POA: Diagnosis not present

## 2017-04-01 DIAGNOSIS — D631 Anemia in chronic kidney disease: Secondary | ICD-10-CM | POA: Diagnosis present

## 2017-04-01 DIAGNOSIS — R195 Other fecal abnormalities: Secondary | ICD-10-CM | POA: Diagnosis present

## 2017-04-01 DIAGNOSIS — I120 Hypertensive chronic kidney disease with stage 5 chronic kidney disease or end stage renal disease: Secondary | ICD-10-CM | POA: Diagnosis present

## 2017-04-01 DIAGNOSIS — I2510 Atherosclerotic heart disease of native coronary artery without angina pectoris: Secondary | ICD-10-CM | POA: Diagnosis present

## 2017-04-01 DIAGNOSIS — Z951 Presence of aortocoronary bypass graft: Secondary | ICD-10-CM | POA: Diagnosis not present

## 2017-04-01 DIAGNOSIS — F17210 Nicotine dependence, cigarettes, uncomplicated: Secondary | ICD-10-CM | POA: Diagnosis present

## 2017-04-01 LAB — CBC
HEMATOCRIT: 27.6 % — AB (ref 40.0–52.0)
Hemoglobin: 8.6 g/dL — ABNORMAL LOW (ref 13.0–18.0)
MCH: 26.3 pg (ref 26.0–34.0)
MCHC: 31.3 g/dL — ABNORMAL LOW (ref 32.0–36.0)
MCV: 84.1 fL (ref 80.0–100.0)
PLATELETS: 405 10*3/uL (ref 150–440)
RBC: 3.28 MIL/uL — ABNORMAL LOW (ref 4.40–5.90)
RDW: 17.4 % — ABNORMAL HIGH (ref 11.5–14.5)
WBC: 6.4 10*3/uL (ref 3.8–10.6)

## 2017-04-01 LAB — COMPREHENSIVE METABOLIC PANEL
ALBUMIN: 3.3 g/dL — AB (ref 3.5–5.0)
ALK PHOS: 209 U/L — AB (ref 38–126)
ALT: 19 U/L (ref 17–63)
ANION GAP: 15 (ref 5–15)
AST: 23 U/L (ref 15–41)
BUN: 70 mg/dL — ABNORMAL HIGH (ref 6–20)
CO2: 25 mmol/L (ref 22–32)
Calcium: 8 mg/dL — ABNORMAL LOW (ref 8.9–10.3)
Chloride: 95 mmol/L — ABNORMAL LOW (ref 101–111)
Creatinine, Ser: 12.3 mg/dL — ABNORMAL HIGH (ref 0.61–1.24)
GFR calc Af Amer: 5 mL/min — ABNORMAL LOW (ref >60)
GFR calc non Af Amer: 4 mL/min — ABNORMAL LOW (ref >60)
GLUCOSE: 91 mg/dL (ref 65–99)
POTASSIUM: 6.9 mmol/L — AB (ref 3.5–5.1)
SODIUM: 135 mmol/L (ref 135–145)
Total Bilirubin: 0.7 mg/dL (ref 0.3–1.2)
Total Protein: 7.4 g/dL (ref 6.5–8.1)

## 2017-04-01 LAB — TROPONIN I
Troponin I: 0.03 ng/mL (ref <0.03)
Troponin I: 0.03 ng/mL (ref <0.03)
Troponin I: <0.03 ng/mL (ref <0.03)

## 2017-04-01 LAB — POTASSIUM: POTASSIUM: 4.2 mmol/L (ref 3.5–5.1)

## 2017-04-01 LAB — MRSA PCR SCREENING: MRSA by PCR: NEGATIVE

## 2017-04-01 MED ORDER — ALBUTEROL SULFATE (2.5 MG/3ML) 0.083% IN NEBU
2.5000 mg | INHALATION_SOLUTION | Freq: Once | RESPIRATORY_TRACT | Status: AC
  Administered 2017-04-01: 2.5 mg via RESPIRATORY_TRACT
  Filled 2017-04-01: qty 3

## 2017-04-01 MED ORDER — TAB-A-VITE/IRON PO TABS
1.0000 | ORAL_TABLET | Freq: Every day | ORAL | Status: DC
  Administered 2017-04-02: 1 via ORAL
  Filled 2017-04-01 (×3): qty 1

## 2017-04-01 MED ORDER — ONDANSETRON HCL 4 MG/2ML IJ SOLN
4.0000 mg | Freq: Once | INTRAMUSCULAR | Status: AC
  Administered 2017-04-01: 4 mg via INTRAVENOUS
  Filled 2017-04-01: qty 2

## 2017-04-01 MED ORDER — TIOTROPIUM BROMIDE MONOHYDRATE 18 MCG IN CAPS
18.0000 ug | ORAL_CAPSULE | Freq: Every day | RESPIRATORY_TRACT | Status: DC
  Administered 2017-04-01 – 2017-04-02 (×2): 18 ug via RESPIRATORY_TRACT
  Filled 2017-04-01: qty 5

## 2017-04-01 MED ORDER — PANTOPRAZOLE SODIUM 40 MG PO TBEC
40.0000 mg | DELAYED_RELEASE_TABLET | Freq: Two times a day (BID) | ORAL | Status: DC
  Administered 2017-04-01 – 2017-04-02 (×3): 40 mg via ORAL
  Filled 2017-04-01 (×3): qty 1

## 2017-04-01 MED ORDER — FERROUS SULFATE 325 (65 FE) MG PO TABS
325.0000 mg | ORAL_TABLET | Freq: Three times a day (TID) | ORAL | Status: DC
  Administered 2017-04-01 – 2017-04-02 (×5): 325 mg via ORAL
  Filled 2017-04-01 (×5): qty 1

## 2017-04-01 MED ORDER — SODIUM BICARBONATE 8.4 % IV SOLN
50.0000 meq | Freq: Once | INTRAVENOUS | Status: AC
  Administered 2017-04-01: 50 meq via INTRAVENOUS
  Filled 2017-04-01: qty 50

## 2017-04-01 MED ORDER — ASPIRIN 81 MG PO CHEW
324.0000 mg | CHEWABLE_TABLET | Freq: Once | ORAL | Status: DC
  Filled 2017-04-01: qty 4

## 2017-04-01 MED ORDER — NITROGLYCERIN 0.4 MG SL SUBL
0.4000 mg | SUBLINGUAL_TABLET | SUBLINGUAL | Status: DC | PRN
  Administered 2017-04-01 – 2017-04-02 (×3): 0.4 mg via SUBLINGUAL
  Filled 2017-04-01: qty 1

## 2017-04-01 MED ORDER — CALCIUM ACETATE (PHOS BINDER) 667 MG PO CAPS
2001.0000 mg | ORAL_CAPSULE | Freq: Three times a day (TID) | ORAL | Status: DC
  Administered 2017-04-01 – 2017-04-02 (×4): 2001 mg via ORAL
  Filled 2017-04-01 (×4): qty 3

## 2017-04-01 MED ORDER — ATORVASTATIN CALCIUM 20 MG PO TABS
40.0000 mg | ORAL_TABLET | Freq: Every day | ORAL | Status: DC
  Administered 2017-04-01 – 2017-04-02 (×2): 40 mg via ORAL
  Filled 2017-04-01 (×2): qty 2

## 2017-04-01 MED ORDER — DIALYVITE SUPREME D 3 MG PO TABS: 1.0000 | ORAL_TABLET | Freq: Every day | ORAL | Status: DC

## 2017-04-01 MED ORDER — IPRATROPIUM-ALBUTEROL 0.5-2.5 (3) MG/3ML IN SOLN
3.0000 mL | Freq: Four times a day (QID) | RESPIRATORY_TRACT | Status: DC
  Administered 2017-04-01 – 2017-04-02 (×5): 3 mL via RESPIRATORY_TRACT
  Filled 2017-04-01 (×7): qty 3

## 2017-04-01 MED ORDER — NITROGLYCERIN 0.4 MG SL SUBL
0.4000 mg | SUBLINGUAL_TABLET | SUBLINGUAL | Status: DC | PRN
  Administered 2017-04-01: 0.4 mg via SUBLINGUAL
  Filled 2017-04-01: qty 1

## 2017-04-01 MED ORDER — EPOETIN ALFA 10000 UNIT/ML IJ SOLN
4000.0000 [IU] | Freq: Once | INTRAMUSCULAR | Status: AC
  Administered 2017-04-01: 4000 [IU] via INTRAVENOUS

## 2017-04-01 MED ORDER — MOMETASONE FURO-FORMOTEROL FUM 200-5 MCG/ACT IN AERO
2.0000 | INHALATION_SPRAY | Freq: Two times a day (BID) | RESPIRATORY_TRACT | Status: DC
  Administered 2017-04-01 – 2017-04-02 (×3): 2 via RESPIRATORY_TRACT
  Filled 2017-04-01: qty 8.8

## 2017-04-01 MED ORDER — ACETAMINOPHEN 650 MG RE SUPP: 650.0000 mg | Freq: Four times a day (QID) | RECTAL | Status: DC | PRN

## 2017-04-01 MED ORDER — ACETAMINOPHEN 325 MG PO TABS: 650.0000 mg | ORAL_TABLET | Freq: Four times a day (QID) | ORAL | Status: DC | PRN

## 2017-04-01 MED ORDER — CINACALCET HCL 30 MG PO TABS
30.0000 mg | ORAL_TABLET | Freq: Every day | ORAL | Status: DC
  Administered 2017-04-01 – 2017-04-02 (×2): 30 mg via ORAL
  Filled 2017-04-01 (×2): qty 1

## 2017-04-01 MED ORDER — INSULIN ASPART 100 UNIT/ML ~~LOC~~ SOLN
10.0000 [IU] | Freq: Once | SUBCUTANEOUS | Status: AC
  Administered 2017-04-01: 10 [IU] via INTRAVENOUS
  Filled 2017-04-01: qty 1

## 2017-04-01 MED ORDER — SODIUM CHLORIDE 0.9 % IV SOLN
1.0000 g | Freq: Once | INTRAVENOUS | Status: AC
  Administered 2017-04-01: 1 g via INTRAVENOUS
  Filled 2017-04-01: qty 10

## 2017-04-01 MED ORDER — MORPHINE SULFATE (PF) 4 MG/ML IV SOLN
4.0000 mg | Freq: Once | INTRAVENOUS | Status: AC
  Administered 2017-04-01: 4 mg via INTRAVENOUS
  Filled 2017-04-01: qty 1

## 2017-04-01 MED ORDER — OXYCODONE HCL 5 MG PO TABS
5.0000 mg | ORAL_TABLET | ORAL | Status: DC | PRN
  Administered 2017-04-02: 5 mg via ORAL
  Filled 2017-04-01: qty 1

## 2017-04-01 MED ORDER — LACTULOSE 10 GM/15ML PO SOLN
20.0000 g | Freq: Three times a day (TID) | ORAL | Status: DC
  Administered 2017-04-01 – 2017-04-02 (×5): 20 g via ORAL
  Filled 2017-04-01 (×5): qty 30

## 2017-04-01 MED ORDER — ALUM & MAG HYDROXIDE-SIMETH 200-200-20 MG/5ML PO SUSP
15.0000 mL | ORAL | Status: DC | PRN
  Administered 2017-04-02: 15 mL via ORAL
  Filled 2017-04-01: qty 30

## 2017-04-01 MED ORDER — DEXTROSE 50 % IV SOLN
50.0000 mL | Freq: Once | INTRAVENOUS | Status: AC
  Administered 2017-04-01: 50 mL via INTRAVENOUS
  Filled 2017-04-01: qty 50

## 2017-04-01 MED ORDER — MORPHINE SULFATE (PF) 2 MG/ML IV SOLN
2.0000 mg | Freq: Once | INTRAVENOUS | Status: AC
  Administered 2017-04-01: 2 mg via INTRAVENOUS
  Filled 2017-04-01: qty 1

## 2017-04-01 MED ORDER — GABAPENTIN 300 MG PO CAPS
300.0000 mg | ORAL_CAPSULE | Freq: Every day | ORAL | Status: DC
  Administered 2017-04-01 – 2017-04-02 (×2): 300 mg via ORAL
  Filled 2017-04-01 (×2): qty 1

## 2017-04-01 NOTE — Progress Notes (Signed)
[facility], [place] 04/01/17  Subjective:   Patient presented to ER for chest pain this morning.  Evaluation in the ER including labs show critically high potassium of 6.9.  Patient is being admitted for further evaluation and management.  His last dialysis treatment was on Monday.  Patient denies any acute shortness of breath.  He is hungry and is asking for lunch.  He is also requesting medication for acid reflux.  He is not sure why his potassium level is high.  He did eat at [employer].   Objective:  Vital signs in last 24 hours:  Temp:  [98.1 F (36.7 C)-98.3 F (36.8 C)] 98.1 F (36.7 C) (01/19 0917) Pulse Rate:  [74-83] 78 (01/19 1030) Resp:  [10-24] 13 (01/19 1030) BP: (115-133)/(49-88) 133/88 (01/19 1030) SpO2:  [98 %-100 %] 98 % (01/19 1030) Weight:  [72.1 kg (159 lb)] 72.1 kg (159 lb) (01/19 0822)  Weight change:  Filed Weights   04/01/17 0822  Weight: 72.1 kg (159 lb)    Intake/Output:   No intake or output data in the 24 hours ending 04/01/17 1038   Physical Exam: General: NAD laying in bed  HEENT Moist oral mucus membranes  Neck supple  Pulm/lungs clear  CVS/Heart regular  Abdomen:  Soft, distended from ascites  Extremities:  Trace  Neurologic: Alert, oriented  Skin: No acute rashes  Access:  Right arm AV graft       Basic Metabolic Panel:  Recent Labs  Lab 03/27/17 0946 04/01/17 0825  NA 140 135  K 4.4 6.9*  CL 95* 95*  CO2 32 25  GLUCOSE 81 91  BUN 28* 70*  CREATININE 5.64* 12.30*  CALCIUM 8.5* 8.0*  PHOS 1.9*  --      CBC: Recent Labs  Lab 04/01/17 0825  WBC 6.4  HGB 8.6*  HCT 27.6*  MCV 84.1  PLT 405      Lab Results  Component Value Date   HEPBSAG Negative 03/01/2017   HEPBSAB Reactive 03/01/2017   HEPBIGM Negative 06/01/2016      Microbiology:  No results found for this or any previous visit (from the past 240 hour(s)).  Coagulation Studies: No results for input(s):  LABPROT, INR in the last 72 hours.  Urinalysis: No results for input(s): COLORURINE, LABSPEC, PHURINE, GLUCOSEU, HGBUR, BILIRUBINUR, KETONESUR, PROTEINUR, UROBILINOGEN, NITRITE, LEUKOCYTESUR in the last 72 hours.  Invalid input(s): APPERANCEUR    Imaging: Dg Chest 1 View  Result Date: 04/01/2017 CLINICAL DATA:  Chest pain EXAM: CHEST 1 VIEW COMPARISON:  03/14/2017 FINDINGS: Chronic mild cardiomegaly. Status post CABG. Stable aortic and hilar contours. There is large lung volumes and chronic interstitial coarsening. There is no edema, consolidation, effusion, or pneumothorax. IMPRESSION: 1. No acute finding when compared to prior. 2. Chronic lung disease (COPD) and cardiomegaly. Electronically Signed   By: Monte Fantasia M.D.   On: 04/01/2017 09:07     Medications:       Assessment/ Plan:  56 y.o. male with end stage renal disease on hemodialysis secondary to Alport's syndrome, ascites, hypertension, anemia of chronic kidney disease, coronary artery disease, peripheral vascular disease, hyperlipidemia, gastrointestinal AVMs, echo in April 2018-> severe pulmonary hypertension, LVH, EF 50-55% moderate to severe mitral regurgitation, left atrial dilation, moderate tricuspid regurgitation likely leading to congestive hepatic insufficiency   [facility] [place] AVF- no outpatient dialysis unit currently  1. Hyperkalemia 2. Anemia of chronic kidney disease 3. Secondary Hyperparathyroidism 4. Ascites 5. Hypertension 6. ESRD  Urgent dialysis today 1K bath. Epogen with dialysis Minimal UF today    LOS: 0 Ivory Bail [name] 1/19/201910:38 AM  [place], [place]

## 2017-04-01 NOTE — ED Triage Notes (Signed)
States chest pain began this am. States history of MI. States was due dialysis today.

## 2017-04-01 NOTE — Progress Notes (Signed)
Dialysis nurse called that patient having some chest pain. Doctor singh place order for morphine. Just waiting for pt to arrive on the floor from the dailysis floor.

## 2017-04-01 NOTE — ED Provider Notes (Signed)
[facility] Emergency Department Provider Note  ____________________________________________   First MD Initiated Contact with Patient 04/01/17 [phone]     (approximate)  I have reviewed the triage vital signs and the nursing notes.   HISTORY  Chief Complaint Chest Pain   HPI Benno Brensinger is a 57 y.o. male with history of alcohol abuse as well as CHF, CAD and end-stage renal disease on dialysis who is presenting to the emergency department with left-sided chest pain radiating up to his face.  He says that his face feels numb.  Says the pain is a 10 out of 10 and feels like an ache.  Says that this feels like previous heart attacks.  Denies any shortness of breath, nausea or vomiting.  Says that he is also missed dialysis x2.  Was seen here on Wednesday but eloped prior to receiving his dialysis.  Also seen by [facility] cardiology yesterday who did not find any acute process at the time.  This was an outpatient appointment.   Past Medical History:  Diagnosis Date  . Alcohol abuse   . CHF (congestive heart failure) ([place])   . Cirrhosis ([place])   . Coronary artery disease 2009  . Diabetic peripheral neuropathy ([place])   . Drug abuse ([place])   . End stage renal disease on dialysis [facility]) NEPHROLOGIST-   DR [facility]  IN [place]   HEMODIALYSIS --   TUES/  THURS/  SAT  . GERD (gastroesophageal reflux disease)   . Hyperlipidemia   . Hypertension   . PAD (peripheral artery disease) ([place])   . Renal insufficiency    Per pt, 32 oz fluid restriction per day  . S/P triple vessel bypass 06/09/2016   2009ish  . Suicidal ideation    & HOMICIDAL IDEATION --  06-16-2013   ADMITTED TO BEHAVIOR HEALTH    Patient Active Problem List   Diagnosis Date Noted  . Uremia 03/08/2017  . Hemodialysis patient ([place]) 03/03/2017  . Weakness 02/28/2017  . Hypocalcemia 02/22/2017  . Shortness of breath 11/26/2016  . COPD (chronic obstructive pulmonary disease) ([place]) 10/30/2016  . COPD  exacerbation ([place]) 10/29/2016  . Anemia   . Heme positive stool   . Ulceration of intestine   . Benign neoplasm of transverse colon   . Acute gastrointestinal hemorrhage   . Esophageal candidiasis ([place])   . Angiodysplasia of intestinal tract   . Acute respiratory failure with hypoxia ([place]) 07/03/2016  . GI bleeding 06/24/2016  . Rectal bleeding 06/14/2016  . Anemia of chronic disease 06/01/2016  . MRSA carrier 06/01/2016  . Chronic renal failure 05/23/2016  . Ischemic heart disease 05/23/2016  . Angiodysplasia of small intestine   . Melena   . Small bowel bleed not requiring more than 4 units of blood in 24 hours, ICU, or surgery   . Anemia due to chronic blood loss   . Abdominal pain 05/05/2016  . Acute posthemorrhagic anemia 04/17/2016  . Gastrointestinal bleed 04/17/2016  . History of esophagogastroduodenoscopy (EGD) 04/17/2016  . Elevated troponin 04/17/2016  . Alcohol abuse 04/17/2016  . Upper GI bleed 01/19/2016  . Blood in stool   . Angiodysplasia of stomach and duodenum with hemorrhage   . Gastritis   . Esophagitis, unspecified   . GI bleed 05/16/2015  . Acute GI bleeding   . Symptomatic anemia 04/30/2015  . HTN (hypertension) 04/06/2015  . GERD (gastroesophageal reflux disease) 04/06/2015  . HLD (hyperlipidemia) 04/06/2015  . Dyspnea 04/06/2015  . Cirrhosis of liver with  ascites ([place]) 04/06/2015  . Ascites 04/06/2015  . GIB (gastrointestinal bleeding) 03/23/2015  . Homicidal ideation 06/19/2013  . Suicidal intent 06/19/2013  . Homicidal ideations 06/19/2013  . Hyperkalemia 06/16/2013  . Mandible fracture ([place]) 06/05/2013  . Fracture, mandible ([place]) 06/02/2013  . Coronary atherosclerosis of native coronary artery 06/02/2013  . ESRD on dialysis ([place]) 06/02/2013  . Mandible open fracture ([place]) 06/02/2013    Past Surgical History:  Procedure Laterality Date  . AGILE CAPSULE N/A 06/19/2016   Procedure: AGILE CAPSULE;  Surgeon: [name], MD;  Location: [facility]  ENDOSCOPY;  Service: Endoscopy;  Laterality: N/A;  . COLONOSCOPY WITH PROPOFOL N/A 06/18/2016   Procedure: COLONOSCOPY WITH PROPOFOL;  Surgeon: [name], MD;  Location: [facility] ENDOSCOPY;  Service: Endoscopy;  Laterality: N/A;  . COLONOSCOPY WITH PROPOFOL N/A 08/12/2016   Procedure: COLONOSCOPY WITH PROPOFOL;  Surgeon: [name], MD;  Location: [facility] ENDOSCOPY;  Service: Endoscopy;  Laterality: N/A;  . CORONARY ANGIOPLASTY  ?   PT UNABLE TO TELL IF  BEFORE OR AFTER  CABG  . CORONARY ARTERY BYPASS GRAFT  2008  ([place])   3 VESSEL  . DIALYSIS FISTULA CREATION  LAST SURGERY  APPOX  2008  . ENTEROSCOPY N/A 05/10/2016   Procedure: ENTEROSCOPY;  Surgeon: Jerene Bears, MD;  Location: [place];  Service: Gastroenterology;  Laterality: N/A;  . ENTEROSCOPY N/A 08/12/2016   Procedure: ENTEROSCOPY;  Surgeon: [name], MD;  Location: [facility] ENDOSCOPY;  Service: Endoscopy;  Laterality: N/A;  . ESOPHAGOGASTRODUODENOSCOPY N/A 05/07/2015   Procedure: ESOPHAGOGASTRODUODENOSCOPY (EGD);  Surgeon: Hulen Luster, MD;  Location: [facility] ENDOSCOPY;  Service: Endoscopy;  Laterality: N/A;  . ESOPHAGOGASTRODUODENOSCOPY (EGD) WITH PROPOFOL N/A 05/17/2015   Procedure: ESOPHAGOGASTRODUODENOSCOPY (EGD) WITH PROPOFOL;  Surgeon: [name], MD;  Location: [facility] ENDOSCOPY;  Service: Endoscopy;  Laterality: N/A;  . ESOPHAGOGASTRODUODENOSCOPY (EGD) WITH PROPOFOL N/A 01/20/2016   Procedure: ESOPHAGOGASTRODUODENOSCOPY (EGD) WITH PROPOFOL;  Surgeon: [name], MD;  Location: [facility] ENDOSCOPY;  Service: Endoscopy;  Laterality: N/A;  . ESOPHAGOGASTRODUODENOSCOPY (EGD) WITH PROPOFOL N/A 04/17/2016   Procedure: ESOPHAGOGASTRODUODENOSCOPY (EGD) WITH PROPOFOL;  Surgeon: Lin Landsman, MD;  Location: [facility] ENDOSCOPY;  Service: Gastroenterology;  Laterality: N/A;  . ESOPHAGOGASTRODUODENOSCOPY (EGD) WITH PROPOFOL  05/09/2016   Procedure: ESOPHAGOGASTRODUODENOSCOPY (EGD) WITH PROPOFOL;  Surgeon: Jerene Bears, MD;  Location: [place];   Service: Endoscopy;;  . ESOPHAGOGASTRODUODENOSCOPY (EGD) WITH PROPOFOL N/A 06/16/2016   Procedure: ESOPHAGOGASTRODUODENOSCOPY (EGD) WITH PROPOFOL;  Surgeon: [name], MD;  Location: [facility] ENDOSCOPY;  Service: Endoscopy;  Laterality: N/A;  . GIVENS CAPSULE STUDY N/A 05/07/2016   Procedure: GIVENS CAPSULE STUDY;  Surgeon: Doran Stabler, MD;  Location: [place];  Service: Endoscopy;  Laterality: N/A;  . MANDIBULAR HARDWARE REMOVAL N/A 07/29/2013   Procedure: REMOVAL OF ARCH BARS;  Surgeon: Theodoro Kos, DO;  Location: [place];  Service: Plastics;  Laterality: N/A;  . ORIF MANDIBULAR FRACTURE N/A 06/05/2013   Procedure: REPAIR OF MANDIBULAR FRACTURE x 2 with maxillo-mandibular fixation ;  Surgeon: Theodoro Kos, DO;  Location: [place];  Service: Plastics;  Laterality: N/A;  . PERIPHERAL ARTERIAL STENT GRAFT Left     Prior to Admission medications   Medication Sig Start Date End Date Taking? Authorizing Provider  albuterol (PROVENTIL HFA;VENTOLIN HFA) 108 (90 Base) MCG/ACT inhaler Inhale 2 puffs into the lungs every 6 (six) hours as needed for wheezing or shortness of breath. 02/19/17   Darel Hong, MD  alum & mag hydroxide-simeth (MAALOX/MYLANTA) 200-200-20 MG/5ML suspension Take 15 mLs  every 4 (four) hours as needed by mouth for indigestion or heartburn. 01/25/17   Demetrios Loll, MD  amLODipine (NORVASC) 5 MG tablet Take 5 mg by mouth daily.    [provider]  atorvastatin (LIPITOR) 40 MG tablet Take 1 tablet (40 mg total) by mouth daily. 11/16/16   Vaughan Basta, MD  benazepril (LOTENSIN) 10 MG tablet Take 1 tablet by mouth daily.    [provider]  budesonide-formoterol (SYMBICORT) 160-4.5 MCG/ACT inhaler Inhale 2 puffs into the lungs daily. 11/16/16   Vaughan Basta, MD  calcium acetate (PHOSLO) 667 MG capsule Take 2,001 mg by mouth 3 (three) times daily.    [provider]  cinacalcet (SENSIPAR) 30 MG tablet Take 1 tablet by mouth  daily.    [provider]  ferrous sulfate 325 (65 FE) MG tablet Take 1 tablet by mouth 3 (three) times daily. 01/21/17   [provider]  furosemide (LASIX) 80 MG tablet Take 1 tablet (80 mg total) daily by mouth. 01/25/17   Demetrios Loll, MD  gabapentin (NEURONTIN) 300 MG capsule TAKE 1 CAPSULE BY MOUTH EVERY EVENING AND 1 EXTRA CAP ON DIALYSIS DAYS AS NEEDED FOR NERVE PAIN 08/16/16   [provider]  ipratropium-albuterol (DUONEB) 0.5-2.5 (3) MG/3ML SOLN Take 3 mLs by nebulization every 6 (six) hours as needed. 11/28/16   Loletha Grayer, MD  labetalol (NORMODYNE) 100 MG tablet Take 1 tablet (100 mg total) by mouth daily. 11/16/16   Vaughan Basta, MD  lactulose (CHRONULAC) 10 GM/15ML solution Take 30 mLs (20 g total) by mouth 3 (three) times daily. 01/11/17   Bettey Costa, MD  Multiple Vitamins-Minerals-FA (DIALYVITE SUPREME D) 3 MG TABS Take 1 tablet (3 mg total) by mouth daily. 11/28/16   Loletha Grayer, MD  nicotine (NICODERM CQ - DOSED IN MG/24 HOURS) 21 mg/24hr patch Place 1 patch (21 mg total) onto the skin daily. Patient not taking: Reported on 03/24/2017 01/12/17   Bettey Costa, MD  nitroGLYCERIN (NITROSTAT) 0.4 MG SL tablet Place 1 tablet (0.4 mg total) under the tongue every 5 (five) minutes as needed. 04/12/16   Wende Bushy, MD  pantoprazole (PROTONIX) 40 MG tablet Take 1 tablet (40 mg total) by mouth daily. 11/28/16   Loletha Grayer, MD  sevelamer carbonate (RENVELA) 800 MG tablet Take 3 tablets (2,400 mg total) by mouth 3 (three) times daily with meals. 11/16/16   Vaughan Basta, MD  Spacer/Aero Chamber Mouthpiece MISC 1 Units by Does not apply route every 4 (four) hours as needed (wheezing). 02/19/17   Darel Hong, MD  tiotropium (SPIRIVA HANDIHALER) 18 MCG inhalation capsule Place 1 capsule (18 mcg total) into inhaler and inhale daily. 11/16/16 11/16/17  Vaughan Basta, MD    Allergies Patient has no known allergies.  Family History    Problem Relation Age of Onset  . Colon cancer Mother   . Cancer Father   . Cancer Sister   . Kidney disease Brother     Social History Social History   Tobacco Use  . Smoking status: Current Every Day Smoker    Packs/day: 0.15    Years: 40.00    Pack years: 6.00    Types: Cigarettes  . Smokeless tobacco: Never Used  Substance Use Topics  . Alcohol use: No    Comment: pt reports quitting after learning about cirrhosis  . Drug use: No    Review of Systems  Constitutional: No fever/chills Eyes: No visual changes. ENT: No sore throat. Cardiovascular: As above Respiratory: Denies shortness  of breath. Gastrointestinal: No abdominal pain.  No nausea, no vomiting.  No diarrhea.  No constipation. Genitourinary: Negative for dysuria. Musculoskeletal: Negative for back pain. Skin: Negative for rash. Neurological: Negative for headaches, focal weakness or numbness.   ____________________________________________   PHYSICAL EXAM:  VITAL SIGNS: ED Triage Vitals  Enc Vitals Group     BP 04/01/17 0819 (!) 123/49     Pulse Rate 04/01/17 0819 82     Resp 04/01/17 0819 18     Temp 04/01/17 0819 98.2 F (36.8 C)     Temp Source 04/01/17 0819 Oral     SpO2 04/01/17 0819 100 %     Weight 04/01/17 0822 159 lb (72.1 kg)     Height 04/01/17 0822 6\' 3"  (1.905 m)     Head Circumference --      Peak Flow --      Pain Score 04/01/17 0819 10     Pain Loc --      Pain Edu? --      Excl. in [place]? --     Constitutional: Alert and oriented. Well appearing and in no acute distress. Eyes: Conjunctivae are normal.  Head: Atraumatic. Nose: No congestion/rhinnorhea. Mouth/Throat: Mucous membranes are moist.  Neck: No stridor.   Cardiovascular: Normal rate, regular rhythm. Grossly normal heart sounds.  Good peripheral circulation with palpable thrill to the right upper externally fistula.  Chest pain reproducible to palpation.   Respiratory: Normal respiratory effort.  No retractions.  Lungs CTAB. Gastrointestinal: Soft and nontender. No distention.  Interval improvement of the patient's ascites from the last time I saw him about 1 week ago. Musculoskeletal: No lower extremity tenderness nor edema.  No joint effusions. Neurologic:  Normal speech and language. No gross focal neurologic deficits are appreciated. Skin:  Skin is warm, dry and intact. No rash noted. Psychiatric: Mood and affect are normal. Speech and behavior are normal.  ____________________________________________   LABS (all labs ordered are listed, but only abnormal results are displayed)  Labs Reviewed  CBC - Abnormal; Notable for the following components:      Result Value   RBC 3.28 (*)    Hemoglobin 8.6 (*)    HCT 27.6 (*)    MCHC 31.3 (*)    RDW 17.4 (*)    All other components within normal limits  TROPONIN I - Abnormal; Notable for the following components:   Troponin I 0.03 (*)    All other components within normal limits  COMPREHENSIVE METABOLIC PANEL - Abnormal; Notable for the following components:   Potassium 6.9 (*)    Chloride 95 (*)    BUN 70 (*)    Creatinine, Ser 12.30 (*)    Calcium 8.0 (*)    Albumin 3.3 (*)    Alkaline Phosphatase 209 (*)    GFR calc non Af Amer 4 (*)    GFR calc Af Amer 5 (*)    All other components within normal limits   ____________________________________________  EKG  ED ECG REPORT I, Doran Stabler, the attending physician, personally viewed and interpreted this ECG.   Date: 04/01/2017  EKG Time: 0820  Rate: 81  Rhythm: normal sinus rhythm  Axis: Rightward  Intervals:nonspecific intraventricular conduction delay  ST&T Change: T wave inversions in 2, 3 and aVF with mild T wave peaking in V2 through 4.  No ST segment elevation or depression.  No abnormal T wave inversion.  Similar EKG seen on previous record.  ____________________________________________  RADIOLOGY  No acute  finding on chest  x-ray ____________________________________________   PROCEDURES  Procedure(s) performed:   .Critical Care Performed by: Orbie Pyo, MD Authorized by: Orbie Pyo, MD   Critical care provider statement:    Critical care time (minutes):  35   Critical care time was exclusive of:  Separately billable procedures and treating other patients   Critical care was necessary to treat or prevent imminent or life-threatening deterioration of the following conditions:  Metabolic crisis   Critical care was time spent personally by me on the following activities:  Discussions with consultants, examination of patient, ordering and performing treatments and interventions, ordering and review of laboratory studies and ordering and review of radiographic studies    Critical Care performed:   ____________________________________________   INITIAL IMPRESSION / ASSESSMENT AND PLAN / ED COURSE  Pertinent labs & imaging results that were available during my care of the patient were reviewed by me and considered in my medical decision making (see chart for details).  Differential diagnosis includes, but is not limited to, ACS, aortic dissection, pulmonary embolism, cardiac tamponade, pneumothorax, pneumonia, pericarditis, myocarditis, GI-related causes including esophagitis/gastritis, and musculoskeletal chest wall pain.   As part of my medical decision making, I reviewed the following data within the electronic MEDICAL RECORD NUMBER Notes from prior ED visits  ----------------------------------------- 9:30 AM on 04/01/2017 -----------------------------------------  I discussed the case with Dr. [name] of nephrology who will be putting in orders for the patient to be dialyzed later today.  Temporizing medicines were ordered.  Patient to be admitted to the medicine service.  Signed out to Dr. Earleen Newport.  Patient understanding the plan and willing to comply.  Says the nitroglycerin did  not help.  Will be given morphine at this time.      ____________________________________________   FINAL CLINICAL IMPRESSION(S) / ED DIAGNOSES  Chest pain.  Hyperkalemia.    NEW MEDICATIONS STARTED DURING THIS VISIT:  New Prescriptions   No medications on file     Note:  This document was prepared using Dragon voice recognition software and may include unintentional dictation errors.     Orbie Pyo, MD 04/01/17 0930

## 2017-04-01 NOTE — H&P (Signed)
[place] [facility] [place] NAME: Stanley [name]    MR#:  [id number]  DATE OF BIRTH:  [birth year]  DATE OF ADMISSION:  04/01/2017  PRIMARY CARE PHYSICIAN: [name], MD   REQUESTING/REFERRING PHYSICIAN: Dr Larae Grooms  CHIEF COMPLAINT:   Chief Complaint  Patient presents with  . Chest Pain    HISTORY OF PRESENT ILLNESS:  Stanley [name]  is a 57 y.o. male dialysis patient presents with chest pain.  He states he woke up with it.  Describes as an ache in his chest starting this morning.  No shortness of breath.  No nausea vomiting.  No sweating.  He states his face is numb.  He states he has a history of GI bleed.  His last dialysis was on Monday.  He has been getting dialysis through the ER because he is unable to go back to his dialysis center at this point in time.  In the ER, his potassium was found to be 6.9.  ER physician ordered some medication to lower his potassium and call nephrology to obtain dialysis.  Patient feeling very weak.  PAST MEDICAL HISTORY:   Past Medical History:  Diagnosis Date  . Alcohol abuse   . CHF (congestive heart failure) ([place])   . Cirrhosis ([place])   . Coronary artery disease 2009  . Diabetic peripheral neuropathy ([place])   . Drug abuse ([place])   . End stage renal disease on dialysis [facility]) NEPHROLOGIST-   DR [facility]  IN    HEMODIALYSIS --   TUES/  THURS/  SAT  . GERD (gastroesophageal reflux disease)   . Hyperlipidemia   . Hypertension   . PAD (peripheral artery disease) ([place])   . Renal insufficiency    Per pt, 32 oz fluid restriction per day  . S/P triple vessel bypass 06/09/2016   2009ish  . Suicidal ideation    & HOMICIDAL IDEATION --  06-16-2013   ADMITTED TO BEHAVIOR HEALTH    PAST SURGICAL HISTORY:   Past Surgical History:  Procedure Laterality Date  . AGILE CAPSULE N/A 06/19/2016   Procedure: AGILE CAPSULE;  Surgeon: [name], MD;  Location: [facility] ENDOSCOPY;  Service:  Endoscopy;  Laterality: N/A;  . COLONOSCOPY WITH PROPOFOL N/A 06/18/2016   Procedure: COLONOSCOPY WITH PROPOFOL;  Surgeon: [name], MD;  Location: [facility] ENDOSCOPY;  Service: Endoscopy;  Laterality: N/A;  . COLONOSCOPY WITH PROPOFOL N/A 08/12/2016   Procedure: COLONOSCOPY WITH PROPOFOL;  Surgeon: [name], MD;  Location: [facility] ENDOSCOPY;  Service: Endoscopy;  Laterality: N/A;  . CORONARY ANGIOPLASTY  ?   PT UNABLE TO TELL IF  BEFORE OR AFTER  CABG  . CORONARY ARTERY BYPASS GRAFT  2008  ([place])   3 VESSEL  . DIALYSIS FISTULA CREATION  LAST SURGERY  APPOX  2008  . ENTEROSCOPY N/A 05/10/2016   Procedure: ENTEROSCOPY;  Surgeon: Jerene Bears, MD;  Location: [place];  Service: Gastroenterology;  Laterality: N/A;  . ENTEROSCOPY N/A 08/12/2016   Procedure: ENTEROSCOPY;  Surgeon: [name], MD;  Location: [facility] ENDOSCOPY;  Service: Endoscopy;  Laterality: N/A;  . ESOPHAGOGASTRODUODENOSCOPY N/A 05/07/2015   Procedure: ESOPHAGOGASTRODUODENOSCOPY (EGD);  Surgeon: Hulen Luster, MD;  Location[facility] END[facility]COPY;  Service: Endoscopy;  Laterality: N/A;  . ESOPHAGOGASTRODUODENOSCOPY (EGD) WITH PROPOFOL N/A 05/17/2015   Procedure: ESOPHAGOGASTRODUODENOSCOPY (EGD) WITH PROPOFOL;  Surgeon: [name], MD;  Location: [facility] ENDOSCOPY;  Service: Endoscopy;  Laterality: N/A;  . ESOPHAGOGASTRODUODENOSCOPY (EGD) WITH PROPOFOL N/A 01/20/2016  Procedure: ESOPHAGOGASTRODUODENOSCOPY (EGD) WITH PROPOFOL;  Surgeon: [name], MD;  Location: [facility] ENDOSCOPY;  Service: Endoscopy;  Laterality: N/A;  . ESOPHAGOGASTRODUODENOSCOPY (EGD) WITH PROPOFOL N/A 04/17/2016   Procedure: ESOPHAGOGASTRODUODENOSCOPY (EGD) WITH PROPOFOL;  Surgeon: Lin Landsman, MD;  Location: [facility] ENDOSCOPY;  Service: Gastroenterology;  Laterality: N/A;  . ESOPHAGOGASTRODUODENOSCOPY (EGD) WITH PROPOFOL  05/09/2016   Procedure: ESOPHAGOGASTRODUODENOSCOPY (EGD) WITH PROPOFOL;  Surgeon: Jerene Bears, MD;  Location: [place];  Service: Endoscopy;;  .  ESOPHAGOGASTRODUODENOSCOPY (EGD) WITH PROPOFOL N/A 06/16/2016   Procedure: ESOPHAGOGASTRODUODENOSCOPY (EGD) WITH PROPOFOL;  Surgeon: [name], MD;  Location: [facility] ENDOSCOPY;  Service: Endoscopy;  Laterality: N/A;  . GIVENS CAPSULE STUDY N/A 05/07/2016   Procedure: GIVENS CAPSULE STUDY;  Surgeon: Doran Stabler, MD;  Location: [place];  Service: Endoscopy;  Laterality: N/A;  . MANDIBULAR HARDWARE REMOVAL N/A 07/29/2013   Procedure: REMOVAL OF ARCH BARS;  Surgeon: Theodoro Kos, DO;  Location: [place];  Service: Plastics;  Laterality: N/A;  . ORIF MANDIBULAR FRACTURE N/A 06/05/2013   Procedure: REPAIR OF MANDIBULAR FRACTURE x 2 with maxillo-mandibular fixation ;  Surgeon: Theodoro Kos, DO;  Location: [place];  Service: Plastics;  Laterality: N/A;  . PERIPHERAL ARTERIAL STENT GRAFT Left     SOCIAL HISTORY:   Social History   Tobacco Use  . Smoking status: Current Every Day Smoker    Packs/day: 0.15    Years: 40.00    Pack years: 6.00    Types: Cigarettes  . Smokeless tobacco: Never Used  Substance Use Topics  . Alcohol use: No    Comment: pt reports quitting after learning about cirrhosis    FAMILY HISTORY:   Family History  Problem Relation Age of Onset  . Colon cancer Mother   . Cancer Father   . Cancer Sister   . Kidney disease Brother     DRUG ALLERGIES:  No Known Allergies  REVIEW OF SYSTEMS:  CONSTITUTIONAL: No fever.  Positive for weakness. EYES: No blurred or double vision.  EARS, NOSE, AND THROAT: No tinnitus or ear pain. No sore throat RESPIRATORY: No cough, shortness of breath, wheezing or hemoptysis.  CARDIOVASCULAR: Positive for chest pain, no orthopnea, edema.  GASTROINTESTINAL: No nausea, vomiting, diarrhea or abdominal pain. No blood in bowel movements GENITOURINARY: No dysuria, hematuria.  ENDOCRINE: No polyuria, nocturia,  HEMATOLOGY: No anemia, easy bruising or bleeding SKIN: No rash or lesion. MUSCULOSKELETAL: No joint pain  or arthritis.   NEUROLOGIC: No tingling, numbness, weakness.  PSYCHIATRY: No anxiety or depression.   MEDICATIONS AT HOME:   Prior to Admission medications   Medication Sig Start Date End Date Taking? Authorizing Provider  albuterol (PROVENTIL HFA;VENTOLIN HFA) 108 (90 Base) MCG/ACT inhaler Inhale 2 puffs into the lungs every 6 (six) hours as needed for wheezing or shortness of breath. 02/19/17  Yes Darel Hong, MD  alum & mag hydroxide-simeth (MAALOX/MYLANTA) 200-200-20 MG/5ML suspension Take 15 mLs every 4 (four) hours as needed by mouth for indigestion or heartburn. 01/25/17  Yes Demetrios Loll, MD  ipratropium-albuterol (DUONEB) 0.5-2.5 (3) MG/3ML SOLN Take 3 mLs by nebulization every 6 (six) hours as needed. 11/28/16  Yes Mary Secord, MD  nitroGLYCERIN (NITROSTAT) 0.4 MG SL tablet Place 1 tablet (0.4 mg total) under the tongue every 5 (five) minutes as needed. 04/12/16  Yes Wende Bushy, MD  Spacer/Aero Chamber Mouthpiece MISC 1 Units by Does not apply route every 4 (four) hours as needed (wheezing). 02/19/17  Yes Darel Hong, MD  amLODipine (NORVASC) 5  MG tablet Take 5 mg by mouth daily.    [provider]  atorvastatin (LIPITOR) 40 MG tablet Take 1 tablet (40 mg total) by mouth daily. 11/16/16   Vaughan Basta, MD  benazepril (LOTENSIN) 10 MG tablet Take 1 tablet by mouth daily.    [provider]  budesonide-formoterol (SYMBICORT) 160-4.5 MCG/ACT inhaler Inhale 2 puffs into the lungs daily. 11/16/16   Vaughan Basta, MD  calcium acetate (PHOSLO) 667 MG capsule Take 2,001 mg by mouth 3 (three) times daily.    [provider]  cinacalcet (SENSIPAR) 30 MG tablet Take 1 tablet by mouth daily.    [provider]  ferrous sulfate 325 (65 FE) MG tablet Take 1 tablet by mouth 3 (three) times daily. 01/21/17   [provider]  furosemide (LASIX) 80 MG tablet Take 1 tablet (80 mg total) daily by mouth. 01/25/17   Demetrios Loll, MD   gabapentin (NEURONTIN) 300 MG capsule TAKE 1 CAPSULE BY MOUTH EVERY EVENING AND 1 EXTRA CAP ON DIALYSIS DAYS AS NEEDED FOR NERVE PAIN 08/16/16   [provider]  labetalol (NORMODYNE) 100 MG tablet Take 1 tablet (100 mg total) by mouth daily. 11/16/16   Vaughan Basta, MD  lactulose (CHRONULAC) 10 GM/15ML solution Take 30 mLs (20 g total) by mouth 3 (three) times daily. 01/11/17   Bettey Costa, MD  Multiple Vitamins-Minerals-FA (DIALYVITE SUPREME D) 3 MG TABS Take 1 tablet (3 mg total) by mouth daily. 11/28/16   Loletha Grayer, MD  nicotine (NICODERM CQ - DOSED IN MG/24 HOURS) 21 mg/24hr patch Place 1 patch (21 mg total) onto the skin daily. Patient not taking: Reported on 03/24/2017 01/12/17   Bettey Costa, MD  pantoprazole (PROTONIX) 40 MG tablet Take 1 tablet (40 mg total) by mouth daily. 11/28/16   Loletha Grayer, MD  sevelamer carbonate (RENVELA) 800 MG tablet Take 3 tablets (2,400 mg total) by mouth 3 (three) times daily with meals. 11/16/16   Vaughan Basta, MD  tiotropium (SPIRIVA HANDIHALER) 18 MCG inhalation capsule Place 1 capsule (18 mcg total) into inhaler and inhale daily. 11/16/16 11/16/17  Vaughan Basta, MD      VITAL SIGNS:  Blood pressure 115/78, pulse 82, temperature 98.1 F (36.7 C), temperature source Oral, resp. rate 17, height 6\' 3"  (1.905 m), weight 72.1 kg (159 lb), SpO2 100 %.  PHYSICAL EXAMINATION:  GENERAL:  57 y.o.-year-old patient lying in the bed with no acute distress.  EYES: Pupils equal, round, reactive to light and accommodation. No scleral icterus. Extraocular muscles intact.  HEENT: Head atraumatic, normocephalic. Oropharynx and nasopharynx clear.  NECK:  Supple, no jugular venous distention. No thyroid enlargement, no tenderness.  LUNGS: Normal breath sounds bilaterally, no wheezing, rales,rhonchi or crepitation. No use of accessory muscles of respiration.  CARDIOVASCULAR: S1, S2 normal. No murmurs, rubs, or gallops.  ABDOMEN: Soft,  nontender, nondistended. Bowel sounds present. No organomegaly or mass.  EXTREMITIES: No pedal edema, cyanosis, or clubbing.  NEUROLOGIC: Cranial nerves II through XII are intact. Muscle strength 5/5 in all extremities. Sensation intact. Gait not checked.  PSYCHIATRIC: The patient is alert and oriented x 3.  SKIN: No rash, lesion, or ulcer.   LABORATORY PANEL:   CBC Recent Labs  Lab 04/01/17 0825  WBC 6.4  HGB 8.6*  HCT 27.6*  PLT 405   ------------------------------------------------------------------------------------------------------------------  Chemistries  Recent Labs  Lab 04/01/17 0825  NA 135  K 6.9*  CL 95*  CO2 25  GLUCOSE 91  BUN 70*  CREATININE 12.30*  CALCIUM 8.0*  AST 23  ALT 19  ALKPHOS 209*  BILITOT 0.7   ------------------------------------------------------------------------------------------------------------------  Cardiac Enzymes Recent Labs  Lab 04/01/17 0825  TROPONINI 0.03*   ------------------------------------------------------------------------------------------------------------------  RADIOLOGY:  Dg Chest 1 View  Result Date: 04/01/2017 CLINICAL DATA:  Chest pain EXAM: CHEST 1 VIEW COMPARISON:  03/14/2017 FINDINGS: Chronic mild cardiomegaly. Status post CABG. Stable aortic and hilar contours. There is large lung volumes and chronic interstitial coarsening. There is no edema, consolidation, effusion, or pneumothorax. IMPRESSION: 1. No acute finding when compared to prior. 2. Chronic lung disease (COPD) and cardiomegaly. Electronically Signed   By: Monte Fantasia M.D.   On: 04/01/2017 09:07    EKG:   Normal sinus rhythm.  V2 through V6 has biphasic T waves.  IMPRESSION AND PLAN:   1.  Life-threatening hyperkalemia.  Patient will be set up for dialysis.  Short-term medications given with insulin D50 and bicarb and calcium. 2.  Chest pain.  First troponin is negative.  With his history of GI bleed this could be gastrointestinal in  nature.  Will check a few more troponins.  Monitor on telemetry.  Hesitant on blood thinners with his history of GI bleed. 3.  End-stage renal disease.  Patient has not had a dialysis since Monday.  Patient will receive dialysis today. 4.  Anemia with hemoglobin of 8.6.  Patient on iron.  History of GI bleeds.  Stool is black as per patient but if this could be iron also. 5.  Relative hypotension for this patient.  Hold antihypertensive medications because he is going to have dialysis today. 6.  Tobacco abuse.  Smoking cessation counseling done 4 minutes by me 7.  History of cirrhosis and ascites 8.  History of congestive heart failure but no signs at this point    All the records are reviewed and case discussed with ED provider. Management plans discussed with the patient, family and they are in agreement.  CODE STATUS: full code  TOTAL TIME TAKING CARE OF THIS PATIENT: 50 minutes.    Loletha Grayer M.D on 04/01/2017 at 9:51 AM  Between 7am to 6pm - Pager - [phone]  After 6pm call admission pager [phone]  [facility] Office  [phone]  CC: Primary care physician; [name], MD

## 2017-04-01 NOTE — Progress Notes (Signed)
Pre HD assessment  

## 2017-04-01 NOTE — Progress Notes (Signed)
HD tx end  

## 2017-04-01 NOTE — Progress Notes (Signed)
Pt experiencing chest pain, nitrostat and O2 given. Pt is on 4L nasal cannula.

## 2017-04-01 NOTE — Progress Notes (Signed)
Post HD assessment  

## 2017-04-01 NOTE — Progress Notes (Signed)
HD tx start 

## 2017-04-01 NOTE — ED Notes (Signed)
Pt c/o chest pain that started this morning. Pt states that he missed his last 2 dialysis treatments on Wednesday and Friday.

## 2017-04-01 NOTE — Progress Notes (Signed)
Pt stable, pt stated " I feel a little better. The pain eased off."

## 2017-04-01 NOTE — ED Notes (Signed)
RN attempted to call report on pt, Receiving RN unable to take report at this time. Will call me back

## 2017-04-02 LAB — BASIC METABOLIC PANEL
Anion gap: 11 (ref 5–15)
BUN: 36 mg/dL — AB (ref 6–20)
CALCIUM: 7.5 mg/dL — AB (ref 8.9–10.3)
CO2: 24 mmol/L (ref 22–32)
CREATININE: 8.14 mg/dL — AB (ref 0.61–1.24)
Chloride: 101 mmol/L (ref 101–111)
GFR calc Af Amer: 8 mL/min — ABNORMAL LOW (ref >60)
GFR, EST NON AFRICAN AMERICAN: 6 mL/min — AB (ref >60)
Glucose, Bld: 100 mg/dL — ABNORMAL HIGH (ref 65–99)
Potassium: 5.2 mmol/L — ABNORMAL HIGH (ref 3.5–5.1)
Sodium: 136 mmol/L (ref 135–145)

## 2017-04-02 LAB — CBC
HCT: 22.9 % — ABNORMAL LOW (ref 40.0–52.0)
Hemoglobin: 7 g/dL — ABNORMAL LOW (ref 13.0–18.0)
MCH: 26 pg (ref 26.0–34.0)
MCHC: 30.8 g/dL — ABNORMAL LOW (ref 32.0–36.0)
MCV: 84.4 fL (ref 80.0–100.0)
PLATELETS: 289 10*3/uL (ref 150–440)
RBC: 2.71 MIL/uL — ABNORMAL LOW (ref 4.40–5.90)
RDW: 17.7 % — AB (ref 11.5–14.5)
WBC: 5.7 10*3/uL (ref 3.8–10.6)

## 2017-04-02 LAB — PREPARE RBC (CROSSMATCH)

## 2017-04-02 MED ORDER — FUROSEMIDE 10 MG/ML IJ SOLN
INTRAMUSCULAR | Status: AC
  Filled 2017-04-02: qty 4

## 2017-04-02 MED ORDER — SODIUM CHLORIDE 0.9 % IV SOLN: Freq: Once | INTRAVENOUS | Status: DC

## 2017-04-02 MED ORDER — ZOLPIDEM TARTRATE 5 MG PO TABS
10.0000 mg | ORAL_TABLET | Freq: Every evening | ORAL | Status: DC | PRN
  Administered 2017-04-02: 10 mg via ORAL
  Filled 2017-04-02: qty 2

## 2017-04-02 MED ORDER — FUROSEMIDE 10 MG/ML IJ SOLN
80.0000 mg | Freq: Once | INTRAMUSCULAR | Status: AC
  Administered 2017-04-02: 80 mg via INTRAVENOUS
  Filled 2017-04-02: qty 8

## 2017-04-02 MED ORDER — ACETAMINOPHEN 325 MG PO TABS
650.0000 mg | ORAL_TABLET | Freq: Once | ORAL | Status: AC
  Administered 2017-04-02: 650 mg via ORAL
  Filled 2017-04-02: qty 2

## 2017-04-02 NOTE — Progress Notes (Signed)
Patient ID: Stanley [name], male   DOB: [birth year], 57 y.o.   MRN: [id number]  [facility] PROGRESS NOTE  Stanley [name] MGQ:[id number] DOB: 12/18/1960 DOA: 04/01/2017 PCP: [name], MD  HPI/Subjective: Patient feeling okay.  No further chest pain.  States he has been having black stools since leaving the hospital last time.  He states he has a procedure [employer] scheduled on the fifth with the gastroenterology team.  Objective: Vitals:   04/02/17 0410 04/02/17 0706  BP: 111/62   Pulse: 79   Resp: 18   Temp: 98.8 F (37.1 C)   SpO2: 98% 98%    Filed Weights   04/01/17 0822 04/01/17 1330 04/01/17 1735  Weight: 72.1 kg (159 lb) 77.5 kg (170 lb 13.7 oz) 75 kg (165 lb 5.5 oz)    ROS: Review of Systems  Constitutional: Negative for chills and fever.  Eyes: Negative for blurred vision.  Respiratory: Negative for cough and shortness of breath.   Cardiovascular: Negative for chest pain.  Gastrointestinal: Negative for abdominal pain, constipation, diarrhea, nausea and vomiting.  Genitourinary: Negative for dysuria.  Musculoskeletal: Negative for joint pain.  Neurological: Negative for dizziness and headaches.   Exam: Physical Exam  HENT:  Nose: No mucosal edema.  Mouth/Throat: No oropharyngeal exudate or posterior oropharyngeal edema.  Eyes: Conjunctivae, EOM and lids are normal. Pupils are equal, round, and reactive to light.  Neck: No JVD present. Carotid bruit is not present. No edema present. No thyroid mass and no thyromegaly present.  Cardiovascular: S1 normal and S2 normal. Exam reveals no gallop.  Murmur heard.  Systolic murmur is present with a grade of 3/6. Pulses:      Dorsalis pedis pulses are 2+ on the right side, and 2+ on the left side.  Respiratory: No respiratory distress. He has no wheezes. He has no rhonchi. He has no rales.  GI: Soft. Bowel sounds are normal. There is no tenderness.  Musculoskeletal:       Right ankle: He  exhibits no swelling.       Left ankle: He exhibits no swelling.  Lymphadenopathy:    He has no cervical adenopathy.  Neurological: He is alert. No cranial nerve deficit.  Skin: Skin is warm. No rash noted. Nails show no clubbing.  Psychiatric: He has a normal mood and affect.      Data Reviewed: Basic Metabolic Panel: Recent Labs  Lab 03/27/17 0946 04/01/17 0825 04/01/17 1547 04/02/17 0518  NA 140 135  --  136  K 4.4 6.9* 4.2 5.2*  CL 95* 95*  --  101  CO2 32 25  --  24  GLUCOSE 81 91  --  100*  BUN 28* 70*  --  36*  CREATININE 5.64* 12.30*  --  8.14*  CALCIUM 8.5* 8.0*  --  7.5*  PHOS 1.9*  --   --   --    Liver Function Tests: Recent Labs  Lab 03/27/17 0946 04/01/17 0825  AST  --  23  ALT  --  19  ALKPHOS  --  209*  BILITOT  --  0.7  PROT  --  7.4  ALBUMIN 2.9* 3.3*   CBC: Recent Labs  Lab 04/01/17 0825 04/02/17 0518  WBC 6.4 5.7  HGB 8.6* 7.0*  HCT 27.6* 22.9*  MCV 84.1 84.4  PLT 405 289   Cardiac Enzymes: Recent Labs  Lab 04/01/17 0825 04/01/17 1254 04/01/17 1547  TROPONINI 0.03* 0.03* <0.03   BNP (last 3 results)  Recent Labs    09/15/16 1528 10/20/16 1153 11/26/16 0633  BNP 2,716.0* 3,568.0* 3,470.0*      Recent Results (from the past 240 hour(s))  MRSA PCR Screening     Status: None   Collection Time: 04/01/17  9:56 PM  Result Value Ref Range Status   MRSA by PCR NEGATIVE NEGATIVE Final    Comment:        The GeneXpert MRSA Assay (FDA approved for NASAL specimens only), is one component of a comprehensive MRSA colonization surveillance program. It is not intended to diagnose MRSA infection nor to guide or monitor treatment for MRSA infections. Performed at [facility], [address], [place], [place] [address]      Studies: Dg Chest 1 View  Result Date: 04/01/2017 CLINICAL DATA:  Chest pain EXAM: CHEST 1 VIEW COMPARISON:  03/14/2017 FINDINGS: Chronic mild cardiomegaly. Status post CABG. Stable aortic and  hilar contours. There is large lung volumes and chronic interstitial coarsening. There is no edema, consolidation, effusion, or pneumothorax. IMPRESSION: 1. No acute finding when compared to prior. 2. Chronic lung disease (COPD) and cardiomegaly. Electronically Signed   By: Monte Fantasia M.D.   On: 04/01/2017 09:07    Scheduled Meds: . acetaminophen  650 mg Oral Once  . atorvastatin  40 mg Oral Daily  . calcium acetate  2,001 mg Oral TID WC  . cinacalcet  30 mg Oral Q supper  . ferrous sulfate  325 mg Oral TID  . furosemide  80 mg Intravenous Once  . gabapentin  300 mg Oral QHS  . ipratropium-albuterol  3 mL Nebulization Q6H  . lactulose  20 g Oral TID  . mometasone-formoterol  2 puff Inhalation BID  . multivitamins with iron  1 tablet Oral Daily  . pantoprazole  40 mg Oral BID  . tiotropium  18 mcg Inhalation Daily   Continuous Infusions: . sodium chloride      Assessment/Plan:  1. Symptomatic anemia with history of chronic GI bleed.  Looking back at endoscopy, small bowel endoscopy and colonoscopy.  The patient did have angiectasia is in the duodenum and erosions in the colon back in 2018.  Transfuse 1 unit of packed red blood cells on a hemoglobin of 7.0.  Benefits and risks of transfusion explained.  Lasix 80 mg IV x1 with transfusion. 2. Hyperkalemia improved with dialysis yesterday.  Still Stanley borderline today. 3. Chest pain has resolved 4. End-stage renal disease.  Patient had dialysis yesterday.  We will leave it up to nephrology if they want to dialyze today with the unit of blood.   5. Relative hypotension for this patient.  Hold antihypertensive medications.  Transfuse a unit of blood and monitor. 6. History of cirrhosis and ascites 7. History of congestive heart failure with severe mitral regurgitation but no signs at this point.  Code Status:     Code Status Orders  (From admission, onward)        Start     Ordered   04/01/17 0948  Full code  Continuous      04/01/17 0947    Code Status History    Date Active Date Inactive Code Status Order ID Comments User Context   03/09/2017 00:10 03/09/2017 19:28 Full Code [id number]  Lance Coon, MD Inpatient   03/03/2017 20:27 03/04/2017 19:20 Full Code [id number]  Demetrios Loll, MD Inpatient   02/28/2017 22:54 03/02/2017 15:19 Full Code [id number]  Lance Coon, MD Inpatient   02/22/2017 17:21 02/24/2017 13:59 Full Code [id number]  Dustin Flock, MD Inpatient   01/22/2017 23:37 01/25/2017 15:22 Full Code [id number]  Gorden Harms, MD Inpatient   01/07/2017 14:45 01/13/2017 18:12 Full Code [id number]  Idelle Crouch, MD Inpatient   12/22/2016 14:31 12/24/2016 18:04 Full Code [id number]  Hillary Bow, MD ED   12/20/2016 20:20 12/21/2016 18:53 Full Code [id number]  Demetrios Loll, MD Inpatient   11/26/2016 10:30 11/28/2016 15:22 Full Code [id number]  Henreitta Leber, MD Inpatient   11/15/2016 19:30 11/16/2016 18:22 Full Code [id number]  Fritzi Mandes, MD Inpatient   10/29/2016 14:53 10/30/2016 15:06 Full Code [id number]  Loletha Grayer, MD ED   09/15/2016 17:57 09/16/2016 18:17 Full Code [id number]  Dustin Flock, MD ED   08/16/2016 09:45 08/18/2016 18:16 Full Code [id number]  Hillary Bow, MD ED   08/09/2016 16:59 08/12/2016 20:34 Full Code [id number]  Fritzi Mandes, MD Inpatient   07/03/2016 21:54 07/05/2016 21:22 Full Code [id number]  Demetrios Loll, MD Inpatient   06/24/2016 04:23 06/26/2016 15:46 Full Code [id number]  Saundra Shelling, MD Inpatient   06/14/2016 14:53 06/19/2016 21:04 Full Code [id number]  Hillary Bow, MD ED   05/31/2016 06:42 06/01/2016 21:30 Full Code [id number]  Saundra Shelling, MD Inpatient   05/05/2016 23:09 05/10/2016 21:57 Full Code [id number]  Vianne Bulls, MD Inpatient   04/16/2016 03:56 04/17/2016 18:27 Full Code [id number]  Saundra Shelling, MD Inpatient   01/19/2016 15:13 01/22/2016 14:14 Full Code [id number]  Loletha Grayer, MD ED   09/24/2015 10:33 09/25/2015 13:31 Full Code [id number]  Loletha Grayer, MD ED   05/16/2015  14:17 05/17/2015 20:57 Full Code [id number]  Idelle Crouch, MD Inpatient   04/30/2015 20:08 05/08/2015 19:40 Full Code [id number]  Vaughan Basta, MD Inpatient   04/06/2015 05:07 04/07/2015 17:36 Full Code [id number]  Lance Coon, MD Inpatient   03/23/2015 11:07 03/27/2015 12:46 Full Code [id number]  Bettey Costa, MD Inpatient   06/19/2013 11:32 06/21/2013 17:49 Full Code [id number]  Cristal Ford, DO Inpatient   06/16/2013 17:26 06/19/2013 11:32 Full Code [id number]  Margarita Mail, PA-C ED   06/05/2013 15:33 06/06/2013 18:56 Full Code [id number]  Theodoro Kos, DO Inpatient   06/02/2013 09:01 06/05/2013 15:33 Full Code [id number]  Velvet Bathe, MD Inpatient   06/02/2013 08:40 06/02/2013 09:01 Full Code [id number]  Velvet Bathe, MD Inpatient      Disposition Plan: To be determined  Consultants:  Nephrology  Time spent: 28 minutes  [place]

## 2017-04-02 NOTE — Progress Notes (Addendum)
Pt. Hbg is 7.0, Dr. Estanislado Pandy made aware, no new orders at this time.

## 2017-04-02 NOTE — Progress Notes (Signed)
[facility], [place] 04/02/17  Subjective:   Patient presented to the emergency room for chest pain and critically high potassium of 6.9.  He was urgently dialyzed yesterday resulting in correction of the potassium.  However, dialysis treatment was complicated by chest pain.  His troponins have been negative.  This morning, he does not have any more chest pain.  He is anemic and is scheduled for a blood transfusion.  He is able to eat without nausea or vomiting.   Objective:  Vital signs in last 24 hours:  Temp:  [97.8 F (36.6 C)-99.2 F (37.3 C)] 98.6 F (37 C) (01/20 1100) Pulse Rate:  [70-87] 82 (01/20 1100) Resp:  [11-18] 16 (01/20 1100) BP: (103-141)/(52-96) 114/52 (01/20 0843) SpO2:  [95 %-100 %] 96 % (01/20 1100) Weight:  [75 kg (165 lb 5.5 oz)-77.5 kg (170 lb 13.7 oz)] 75 kg (165 lb 5.5 oz) (01/19 1735)  Weight change:  Filed Weights   04/01/17 0822 04/01/17 1330 04/01/17 1735  Weight: 72.1 kg (159 lb) 77.5 kg (170 lb 13.7 oz) 75 kg (165 lb 5.5 oz)    Intake/Output:    Intake/Output Summary (Last 24 hours) at 04/02/2017 1141 Last data filed at 04/02/2017 1006 Gross per 24 hour  Intake 120 ml  Output 2034 ml  Net -1914 ml     Physical Exam: General: NAD laying in bed  HEENT Moist oral mucus membranes  Neck supple  Pulm/lungs clear  CVS/Heart regular  Abdomen:  Soft, distended from ascites  Extremities:  Trace  Neurologic: Alert, oriented  Skin: No acute rashes  Access:  Right arm AV graft       Basic Metabolic Panel:  Recent Labs  Lab 03/27/17 0946 04/01/17 0825 04/01/17 1547 04/02/17 0518  NA 140 135  --  136  K 4.4 6.9* 4.2 5.2*  CL 95* 95*  --  101  CO2 32 25  --  24  GLUCOSE 81 91  --  100*  BUN 28* 70*  --  36*  CREATININE 5.64* 12.30*  --  8.14*  CALCIUM 8.5* 8.0*  --  7.5*  PHOS 1.9*  --   --   --      CBC: Recent Labs  Lab 04/01/17 0825 04/02/17 0518  WBC 6.4 5.7  HGB 8.6* 7.0*  HCT 27.6* 22.9*   MCV 84.1 84.4  PLT 405 289      Lab Results  Component Value Date   HEPBSAG Negative 03/01/2017   HEPBSAB Reactive 03/01/2017   HEPBIGM Negative 06/01/2016      Microbiology:  Recent Results (from the past 240 hour(s))  MRSA PCR Screening     Status: None   Collection Time: 04/01/17  9:56 PM  Result Value Ref Range Status   MRSA by PCR NEGATIVE NEGATIVE Final    Comment:        The GeneXpert MRSA Assay (FDA approved for NASAL specimens only), is one component of a comprehensive MRSA colonization surveillance program. It is not intended to diagnose MRSA infection nor to guide or monitor treatment for MRSA infections. Performed at [facility], [address] [place], [place] [address]     Coagulation Studies: No results for input(s): LABPROT, INR in the last 72 hours.  Urinalysis: No results for input(s): COLORURINE, LABSPEC, PHURINE, GLUCOSEU, HGBUR, BILIRUBINUR, KETONESUR, PROTEINUR, UROBILINOGEN, NITRITE, LEUKOCYTESUR in the last 72 hours.  Invalid input(s): APPERANCEUR    Imaging: Dg Chest 1 View  Result Date: 04/01/2017 CLINICAL DATA:  Chest pain EXAM: CHEST 1 VIEW COMPARISON:  03/14/2017 FINDINGS: Chronic mild cardiomegaly. Status post CABG. Stable aortic and hilar contours. There is large lung volumes and chronic interstitial coarsening. There is no edema, consolidation, effusion, or pneumothorax. IMPRESSION: 1. No acute finding when compared to prior. 2. Chronic lung disease (COPD) and cardiomegaly. Electronically Signed   By: Monte Fantasia M.D.   On: 04/01/2017 09:07     Medications:   . atorvastatin  40 mg Oral Daily  . calcium acetate  2,001 mg Oral TID WC  . cinacalcet  30 mg Oral Q supper  . ferrous sulfate  325 mg Oral TID  . gabapentin  300 mg Oral QHS  . ipratropium-albuterol  3 mL Nebulization Q6H  . lactulose  20 g Oral TID  . mometasone-formoterol  2 puff Inhalation BID  . multivitamins with iron  1 tablet Oral Daily  .  pantoprazole  40 mg Oral BID  . tiotropium  18 mcg Inhalation Daily      Assessment/ Plan:  57 y.o. male with end stage renal disease on hemodialysis secondary to Alport's syndrome, ascites, hypertension, anemia of chronic kidney disease, coronary artery disease, peripheral vascular disease, hyperlipidemia, gastrointestinal AVMs, echo in April 2018-> severe pulmonary hypertension, LVH, EF 50-55% moderate to severe mitral regurgitation, left atrial dilation, moderate tricuspid regurgitation likely leading to congestive hepatic insufficiency   [facility] TTS [place] AVF- no outpatient dialysis unit currently  1. Hyperkalemia 2. Anemia of chronic kidney disease 3. Secondary Hyperparathyroidism 4. Ascites 5. Hypertension 6. ESRD  Patient was urgently dialyzed yesterday for severe hyperkalemia.  However, he did not finish his treatment due to chest pain during dialysis treatment.  Troponins has been negative.  If the pain reoccurs during dialysis tomorrow, he may need cardiology evaluation.  In the meantime, he will get a blood transfusion for anemia.  He has a GI procedure scheduled at [facility] in a couple of weeks.  We will continue Epogen with dialysis.  Continue Sensipar and calcium acetate.     LOS: 1 Saria Haran 1/20/201911:41 AM  [facility] [place], [place]

## 2017-04-02 NOTE — Clinical Social Work Note (Signed)
CSW received consult for dialysis placement. As the patient is not requiring SNF as well, this would fall under the role of RNCM. RNCM has a current consult. CSW is signing off. Please consult should additional needs arise.  Santiago Bumpers, MSW, Latanya Presser [phone]

## 2017-04-02 NOTE — Progress Notes (Signed)
MRSA PCR negative.  

## 2017-04-03 LAB — TYPE AND SCREEN
ABO/RH(D): A POS
ANTIBODY SCREEN: NEGATIVE
Unit division: 0

## 2017-04-03 LAB — BASIC METABOLIC PANEL
ANION GAP: 14 (ref 5–15)
BUN: 41 mg/dL — AB (ref 6–20)
CALCIUM: 7.6 mg/dL — AB (ref 8.9–10.3)
CO2: 22 mmol/L (ref 22–32)
Chloride: 100 mmol/L — ABNORMAL LOW (ref 101–111)
Creatinine, Ser: 9.06 mg/dL — ABNORMAL HIGH (ref 0.61–1.24)
GFR calc Af Amer: 7 mL/min — ABNORMAL LOW (ref >60)
GFR, EST NON AFRICAN AMERICAN: 6 mL/min — AB (ref >60)
GLUCOSE: 96 mg/dL (ref 65–99)
Potassium: 5.4 mmol/L — ABNORMAL HIGH (ref 3.5–5.1)
SODIUM: 136 mmol/L (ref 135–145)

## 2017-04-03 LAB — BPAM RBC
Blood Product Expiration Date: 201902062359
ISSUE DATE / TIME: 201901201130
UNIT TYPE AND RH: 6200

## 2017-04-03 LAB — HEMOGLOBIN: HEMOGLOBIN: 8.4 g/dL — AB (ref 13.0–18.0)

## 2017-04-03 MED ORDER — SODIUM CHLORIDE 0.9 % IV SOLN
400.0000 mg | Freq: Once | INTRAVENOUS | Status: DC
  Filled 2017-04-03: qty 20

## 2017-04-03 NOTE — Progress Notes (Signed)
Patient has decided to leave [facility] this AM. Paperwork signed. Wenda Low [facility]

## 2017-04-03 NOTE — Discharge Summary (Signed)
[place] at [place] NAME: Stanley [name]    MR#:  [id number]  DATE OF BIRTH:  12/03/1967  DATE OF ADMISSION:  04/01/2017 ADMITTING PHYSICIAN: Loletha Grayer, MD  DATE OF signing out [place]: 04/03/2017  8:51 AM  PRIMARY CARE PHYSICIAN: Revelo, Elyse Jarvis, MD    ADMISSION DIAGNOSIS:  Hyperkalemia [E87.5] Chest pain, unspecified type [R07.9]  DISCHARGE DIAGNOSIS:  Active Problems:   Hyperkalemia   SECONDARY DIAGNOSIS:   Past Medical History:  Diagnosis Date  . Alcohol abuse   . CHF (congestive heart failure) ([place])   . Cirrhosis ([place])   . Coronary artery disease 2009  . Diabetic peripheral neuropathy ([place])   . Drug abuse ([place])   . End stage renal disease on dialysis [facility]) NEPHROLOGIST-   [facility]  IN [place]   HEMODIALYSIS --   TUES/  THURS/  SAT  . GERD (gastroesophageal reflux disease)   . Hyperlipidemia   . Hypertension   . PAD (peripheral artery disease) ([place])   . Renal insufficiency    Per pt, 32 oz fluid restriction per day  . S/P triple vessel bypass 06/09/2016   2009ish  . Suicidal ideation    & HOMICIDAL IDEATION --  06-16-2013   ADMITTED TO [place] COURSE:   1.  Hyperkalemia on presentation of 6.9.  Patient had urgent dialysis on the day of admission.  He was treated with short-term medications to bring down potassium.  On the day of signing out [place] his potassium was 5.4.  We were scheduling him to go to dialysis today.  Patient signed out [place] prior to dialysis.  I explained that potassium elevation can be life-threatening. 2.  Symptomatic anemia with history of chronic GI bleed.  Patient was transfused 1 unit of packed red blood cells on a hemoglobin of 7.0 and hemoglobin came up to 8.4.  I did prescribe IV iron but the patient signed out [place] prior. 3.  Chest pain on presentation this has resolved.  Cardiac enzymes were  negative.  Likely GI in nature. 4.  End-stage renal disease requiring urgent dialysis on the day of admission secondary to hyperkalemia.  Patient was set up for dialysis again today but the patient signed out [place] before dialysis.  The patient has been verbally abusive to the staff over an outpatient dialysis center so he does not have a place to go at this point time.  He comes back to the ER for dialysis.  Case discussed with nephrology. 5.  Relative hypotension for this patient.  Hold antihypertensive medications. 6.  History of cirrhosis and ascites 7.  History of congestive heart failure with severe mitral regurgitation.  No signs of congestive heart failure on this hospital stay. 8.  History of COPD.  Lungs actually sound very good for this patient.  DISCHARGE CONDITIONS:   Patient signed out AMA Hyperkalemia can be life-threatening  CONSULTS OBTAINED:  Nephrology  DRUG ALLERGIES:  No Known Allergies  DISCHARGE MEDICATIONS:   Allergies as of 04/03/2017   No Known Allergies     Medication List    STOP taking these medications   amLODipine 5 MG tablet Commonly known as:  NORVASC   benazepril 10 MG tablet Commonly known as:  LOTENSIN   labetalol 100 MG tablet Commonly known as:  NORMODYNE   nicotine 21 mg/24hr patch Commonly known as:  NICODERM CQ - dosed in mg/24  hours   sevelamer carbonate 800 MG tablet Commonly known as:  RENVELA     TAKE these medications   albuterol 108 (90 Base) MCG/ACT inhaler Commonly known as:  PROVENTIL HFA;VENTOLIN HFA Inhale 2 puffs into the lungs every 6 (six) hours as needed for wheezing or shortness of breath.   alum & mag hydroxide-simeth 200-200-20 MG/5ML suspension Commonly known as:  MAALOX/MYLANTA Take 15 mLs every 4 (four) hours as needed by mouth for indigestion or heartburn.   atorvastatin 40 MG tablet Commonly known as:  LIPITOR Take 1 tablet (40 mg total) by mouth daily.   budesonide-formoterol  160-4.5 MCG/ACT inhaler Commonly known as:  SYMBICORT Inhale 2 puffs into the lungs daily.   calcium acetate 667 MG capsule Commonly known as:  PHOSLO Take 2,001 mg by mouth 3 (three) times daily.   DIALYVITE SUPREME D 3 MG Tabs Take 1 tablet (3 mg total) by mouth daily.   ferrous sulfate 325 (65 FE) MG tablet Take 1 tablet by mouth 3 (three) times daily.   furosemide 80 MG tablet Commonly known as:  LASIX Take 1 tablet (80 mg total) daily by mouth.   gabapentin 300 MG capsule Commonly known as:  NEURONTIN Take 1 capsule by mouth daily as directed   ipratropium-albuterol 0.5-2.5 (3) MG/3ML Soln Commonly known as:  DUONEB Take 3 mLs by nebulization every 6 (six) hours as needed.   lactulose 10 GM/15ML solution Commonly known as:  CHRONULAC Take 30 mLs (20 g total) by mouth 3 (three) times daily.   nitroGLYCERIN 0.4 MG SL tablet Commonly known as:  NITROSTAT Place 1 tablet (0.4 mg total) under the tongue every 5 (five) minutes as needed.   pantoprazole 40 MG tablet Commonly known as:  PROTONIX Take 1 tablet (40 mg total) by mouth daily.   SENSIPAR 30 MG tablet Generic drug:  cinacalcet Take 1 tablet by mouth daily.   Spacer/Aero Chamber Mouthpiece Misc 1 Units by Does not apply route every 4 (four) hours as needed (wheezing).   tiotropium 18 MCG inhalation capsule Commonly known as:  SPIRIVA HANDIHALER Place 1 capsule (18 mcg total) into inhaler and inhale daily.        DISCHARGE INSTRUCTIONS:   Follow-up with medical doctor 1 week He follows up with dialysis as he feels the need to do so.  If you experience worsening of your admission symptoms, develop shortness of breath, life threatening emergency, suicidal or homicidal thoughts you must seek medical attention immediately by calling 911 or calling your MD immediately  if symptoms less severe.  You Must read complete instructions/literature along with all the possible adverse reactions/side effects for all  the Medicines you take and that have been prescribed to you. Take any new Medicines after you have completely understood and accept all the possible adverse reactions/side effects.   Please note  You were cared for by a hospitalist during your hospital stay. If you have any questions about your discharge medications or the care you received while you were in the hospital after you are discharged, you can call the unit and asked to speak with the hospitalist on call if the hospitalist that took care of you is not available. Once you are discharged, your primary care physician will handle any further medical issues. Please note that NO REFILLS for any discharge medications will be authorized once you are discharged, as it is imperative that you return to your primary care physician (or establish a relationship with a primary care physician if  you do not have one) for your aftercare needs so that they can reassess your need for medications and monitor your lab values.    Today   CHIEF COMPLAINT:   Chief Complaint  Patient presents with  . Chest Pain    HISTORY OF PRESENT ILLNESS:  Brewer Hitchman  is a 57 y.o. male presented with chest pain   VITAL SIGNS:  Blood pressure (!) 114/59, pulse 81, temperature 98.3 F (36.8 C), temperature source Oral, resp. rate 16, height 6\' 3"  (1.905 m), weight 75 kg (165 lb 4.8 oz), SpO2 98 %.    PHYSICAL EXAMINATION:  GENERAL:  57 y.o.-year-old patient lying in the bed with no acute distress.  EYES: Pupils equal, round, reactive to light and accommodation. No scleral icterus. Extraocular muscles intact.  HEENT: Head atraumatic, normocephalic. Oropharynx and nasopharynx clear.  NECK:  Supple, no jugular venous distention. No thyroid enlargement, no tenderness.  LUNGS: Normal breath sounds bilaterally, no wheezing, rales,rhonchi or crepitation. No use of accessory muscles of respiration.  CARDIOVASCULAR: S1, S2 normal.  2 out of 6 systolic murmurs, no rubs,  or gallops.  ABDOMEN: Soft, non-tender, non-distended. Bowel sounds present. No organomegaly or mass.  EXTREMITIES: No pedal edema, cyanosis, or clubbing.  NEUROLOGIC: Cranial nerves II through XII are intact. Muscle strength 5/5 in all extremities. Sensation intact. Gait not checked.  PSYCHIATRIC: The patient is alert and oriented x 3.  SKIN: No obvious rash, lesion, or ulcer.   DATA REVIEW:   CBC Recent Labs  Lab 04/02/17 0518 04/03/17 0410  WBC 5.7  --   HGB 7.0* 8.4*  HCT 22.9*  --   PLT 289  --     Chemistries  Recent Labs  Lab 04/01/17 0825  04/03/17 0410  NA 135   < > 136  K 6.9*   < > 5.4*  CL 95*   < > 100*  CO2 25   < > 22  GLUCOSE 91   < > 96  BUN 70*   < > 41*  CREATININE 12.30*   < > 9.06*  CALCIUM 8.0*   < > 7.6*  AST 23  --   --   ALT 19  --   --   ALKPHOS 209*  --   --   BILITOT 0.7  --   --    < > = values in this interval not displayed.    Cardiac Enzymes Recent Labs  Lab 04/01/17 1547  TROPONINI <0.03    Microbiology Results  Results for orders placed or performed during the hospital encounter of 04/01/17  MRSA PCR Screening     Status: None   Collection Time: 04/01/17  9:56 PM  Result Value Ref Range Status   MRSA by PCR NEGATIVE NEGATIVE Final    Comment:        The GeneXpert MRSA Assay (FDA approved for NASAL specimens only), is one component of a comprehensive MRSA colonization surveillance program. It is not intended to diagnose MRSA infection nor to guide or monitor treatment for MRSA infections. Performed at [facility], [address] [place], [place] [address]       CODE STATUS:  Code Status History    Date Active Date Inactive Code Status Order ID Comments User Context   04/01/2017 09:47 04/03/2017 11:56 Full Code [id number]  Loletha Grayer, MD ED   03/09/2017 00:10 03/09/2017 19:28 Full Code [id number]  Lance Coon, MD Inpatient   03/03/2017 20:27 03/04/2017 19:20 Full Code [id number]  Demetrios Loll, MD  Inpatient   02/28/2017 22:54 03/02/2017 15:19 Full Code [id number]  Lance Coon, MD Inpatient   02/22/2017 17:21 02/24/2017 13:59 Full Code [id number]  Dustin Flock, MD Inpatient   01/22/2017 23:37 01/25/2017 15:22 Full Code [id number]  Gorden Harms, MD Inpatient   01/07/2017 14:45 01/13/2017 18:12 Full Code [id number]  Idelle Crouch, MD Inpatient   12/22/2016 14:31 12/24/2016 18:04 Full Code [id number]  Hillary Bow, MD ED   12/20/2016 20:20 12/21/2016 18:53 Full Code [id number]  Demetrios Loll, MD Inpatient   11/26/2016 10:30 11/28/2016 15:22 Full Code [id number]  Henreitta Leber, MD Inpatient   11/15/2016 19:30 11/16/2016 18:22 Full Code [id number]  Fritzi Mandes, MD Inpatient   10/29/2016 14:53 10/30/2016 15:06 Full Code [id number]  Loletha Grayer, MD ED   09/15/2016 17:57 09/16/2016 18:17 Full Code [id number]  Dustin Flock, MD ED   08/16/2016 09:45 08/18/2016 18:16 Full Code [id number]  Hillary Bow, MD ED   08/09/2016 16:59 08/12/2016 20:34 Full Code [id number]  Fritzi Mandes, MD Inpatient   07/03/2016 21:54 07/05/2016 21:22 Full Code [id number]  Demetrios Loll, MD Inpatient   06/24/2016 04:23 06/26/2016 15:46 Full Code [id number]  Saundra Shelling, MD Inpatient   06/14/2016 14:53 06/19/2016 21:04 Full Code [id number]  Hillary Bow, MD ED   05/31/2016 06:42 06/01/2016 21:30 Full Code [id number]  Saundra Shelling, MD Inpatient   05/05/2016 23:09 05/10/2016 21:57 Full Code [id number]  Vianne Bulls, MD Inpatient   04/16/2016 03:56 04/17/2016 18:27 Full Code [id number]  Saundra Shelling, MD Inpatient   01/19/2016 15:13 01/22/2016 14:14 Full Code [id number]  Loletha Grayer, MD ED   09/24/2015 10:33 09/25/2015 13:31 Full Code [id number]  Loletha Grayer, MD ED   05/16/2015 14:17 05/17/2015 20:57 Full Code [id number]  Idelle Crouch, MD Inpatient   04/30/2015 20:08 05/08/2015 19:40 Full Code [id number]  Vaughan Basta, MD Inpatient   04/06/2015 05:07 04/07/2015 17:36 Full Code [id number]  Lance Coon, MD Inpatient   03/23/2015 11:07  03/27/2015 12:46 Full Code [id number]  Bettey Costa, MD Inpatient   06/19/2013 11:32 06/21/2013 17:49 Full Code [id number]  Cristal Ford, DO Inpatient   06/16/2013 17:26 06/19/2013 11:32 Full Code [id number]  Margarita Mail, PA-C ED   06/05/2013 15:33 06/06/2013 18:56 Full Code [id number]  Theodoro Kos, DO Inpatient   06/02/2013 09:01 06/05/2013 15:33 Full Code [id number]  Velvet Bathe, MD Inpatient   06/02/2013 08:40 06/02/2013 09:01 Full Code [id number]  Velvet Bathe, MD Inpatient      TOTAL TIME TAKING CARE OF THIS PATIENT: 31 minutes.    Loletha Grayer M.D on 04/03/2017 at 1:36 PM  Between 7am to 6pm - Pager - [phone]  After 6pm go to [email or web address] - password [email or web address]  [facility] Office  [phone]  CC: Primary care physician; [name], MD

## 2017-04-03 NOTE — Discharge Instructions (Signed)
Hyperkalemia °Hyperkalemia is when you have too much potassium in your blood. Potassium is normally removed (excreted) from your body by your kidneys. If there is too much potassium in your blood, it can affect how your heart works. °Follow these instructions at home: °· Take medicines only as told by your doctor. °· Do not take any supplements, natural products, herbs, or vitamins unless your doctor says it is okay. °· Limit your alcohol intake as told by your doctor. °· Stop illegal drug use. If you need help quitting, ask your doctor. °· Keep all follow-up visits as told by your doctor. This is important. °· If you have kidney disease, you may need to follow a low potassium diet. A food specialist (dietitian) can help you. °Contact a doctor if: °· Your heartbeat is not regular or very slow. °· You feel dizzy (light-headed). °· You feel weak. °· You feel sick to your stomach (nauseous). °· You have tingling in your hands or feet. °· You cannot feel your hands or feet. °Get help right away if: °· You are short of breath. °· You have chest pain. °· You pass out (faint). °· You cannot move your muscles. °This information is not intended to replace advice given to you by your health care provider. Make sure you discuss any questions you have with your health care provider. °Document Released: 02/28/2005 Document Revised: 08/06/2015 Document Reviewed: 06/05/2013 °Elsevier Interactive Patient Education © 2018 [employer] ° °

## 2017-04-05 ENCOUNTER — Other Ambulatory Visit: Payer: Self-pay

## 2017-04-05 ENCOUNTER — Emergency Department
Admission: EM | Admit: 2017-04-05 | Discharge: 2017-04-05 | Disposition: A | Discharge status: Home / Self Care | Payer: Medicare Other | Attending: Emergency Medicine | Admitting: Emergency Medicine

## 2017-04-05 ENCOUNTER — Encounter: Payer: Self-pay | Admitting: Emergency Medicine

## 2017-04-05 DIAGNOSIS — Z4931 Encounter for adequacy testing for hemodialysis: Secondary | ICD-10-CM | POA: Diagnosis not present

## 2017-04-05 DIAGNOSIS — Z992 Dependence on renal dialysis: Secondary | ICD-10-CM | POA: Diagnosis not present

## 2017-04-05 DIAGNOSIS — I132 Hypertensive heart and chronic kidney disease with heart failure and with stage 5 chronic kidney disease, or end stage renal disease: Secondary | ICD-10-CM | POA: Diagnosis not present

## 2017-04-05 DIAGNOSIS — D631 Anemia in chronic kidney disease: Secondary | ICD-10-CM | POA: Diagnosis not present

## 2017-04-05 DIAGNOSIS — N186 End stage renal disease: Secondary | ICD-10-CM | POA: Diagnosis not present

## 2017-04-05 DIAGNOSIS — I2510 Atherosclerotic heart disease of native coronary artery without angina pectoris: Secondary | ICD-10-CM | POA: Insufficient documentation

## 2017-04-05 DIAGNOSIS — J449 Chronic obstructive pulmonary disease, unspecified: Secondary | ICD-10-CM | POA: Diagnosis not present

## 2017-04-05 DIAGNOSIS — E1140 Type 2 diabetes mellitus with diabetic neuropathy, unspecified: Secondary | ICD-10-CM | POA: Insufficient documentation

## 2017-04-05 DIAGNOSIS — E875 Hyperkalemia: Secondary | ICD-10-CM

## 2017-04-05 DIAGNOSIS — E1122 Type 2 diabetes mellitus with diabetic chronic kidney disease: Secondary | ICD-10-CM | POA: Insufficient documentation

## 2017-04-05 DIAGNOSIS — I120 Hypertensive chronic kidney disease with stage 5 chronic kidney disease or end stage renal disease: Secondary | ICD-10-CM | POA: Diagnosis not present

## 2017-04-05 DIAGNOSIS — F17210 Nicotine dependence, cigarettes, uncomplicated: Secondary | ICD-10-CM | POA: Insufficient documentation

## 2017-04-05 DIAGNOSIS — I10 Essential (primary) hypertension: Secondary | ICD-10-CM | POA: Diagnosis not present

## 2017-04-05 DIAGNOSIS — N2581 Secondary hyperparathyroidism of renal origin: Secondary | ICD-10-CM | POA: Diagnosis not present

## 2017-04-05 DIAGNOSIS — I509 Heart failure, unspecified: Secondary | ICD-10-CM | POA: Diagnosis not present

## 2017-04-05 LAB — CBC WITH DIFFERENTIAL/PLATELET
BASOS ABS: 0 10*3/uL (ref 0–0.1)
BASOS PCT: 1 %
Eosinophils Absolute: 0.1 10*3/uL (ref 0–0.7)
Eosinophils Relative: 2 %
HCT: 26.9 % — ABNORMAL LOW (ref 40.0–52.0)
HEMOGLOBIN: 8.4 g/dL — AB (ref 13.0–18.0)
Lymphocytes Relative: 9 %
Lymphs Abs: 0.6 10*3/uL — ABNORMAL LOW (ref 1.0–3.6)
MCH: 26.9 pg (ref 26.0–34.0)
MCHC: 31.3 g/dL — ABNORMAL LOW (ref 32.0–36.0)
MCV: 86 fL (ref 80.0–100.0)
MONOS PCT: 13 %
Monocytes Absolute: 0.8 10*3/uL (ref 0.2–1.0)
NEUTROS ABS: 4.9 10*3/uL (ref 1.4–6.5)
NEUTROS PCT: 75 %
Platelets: 302 10*3/uL (ref 150–440)
RBC: 3.13 MIL/uL — AB (ref 4.40–5.90)
RDW: 17.4 % — ABNORMAL HIGH (ref 11.5–14.5)
WBC: 6.5 10*3/uL (ref 3.8–10.6)

## 2017-04-05 LAB — BASIC METABOLIC PANEL
ANION GAP: 13 (ref 5–15)
BUN: 62 mg/dL — ABNORMAL HIGH (ref 6–20)
CHLORIDE: 99 mmol/L — AB (ref 101–111)
CO2: 22 mmol/L (ref 22–32)
CREATININE: 11.63 mg/dL — AB (ref 0.61–1.24)
Calcium: 7.4 mg/dL — ABNORMAL LOW (ref 8.9–10.3)
GFR calc non Af Amer: 4 mL/min — ABNORMAL LOW (ref >60)
GFR, EST AFRICAN AMERICAN: 5 mL/min — AB (ref >60)
Glucose, Bld: 102 mg/dL — ABNORMAL HIGH (ref 65–99)
POTASSIUM: 6.8 mmol/L — AB (ref 3.5–5.1)
SODIUM: 134 mmol/L — AB (ref 135–145)
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
## 2017-04-05 NOTE — ED Provider Notes (Signed)
[facility] Emergency Department Provider Note       Time seen: ----------------------------------------- 2:32 PM on 04/05/2017 -----------------------------------------   I have reviewed the triage vital signs and the nursing notes.  HISTORY   Chief Complaint Chronic Kidney Disease    HPI Stanley [name] is a 57 y.o. male with a history of alcohol abuse, CHF, cirrhosis, peripheral neuropathy, end-stage renal disease on dialysis who presents to the ED for recheck concerning dialysis.  Patient was dialyzed this morning after being seen by me and sent to dialysis.  He arrives without complaint.  Past Medical History:  Diagnosis Date  . Alcohol abuse   . CHF (congestive heart failure) ([place])   . Cirrhosis ([place])   . Coronary artery disease 2009  . Diabetic peripheral neuropathy ([place])   . Drug abuse ([place])   . End stage renal disease on dialysis [facility]) NEPHROLOGIST-   DR [facility]  IN [place]   HEMODIALYSIS --   TUES/  THURS/  SAT  . GERD (gastroesophageal reflux disease)   . Hyperlipidemia   . Hypertension   . PAD (peripheral artery disease) ([place])   . Renal insufficiency    Per pt, 32 oz fluid restriction per day  . S/P triple vessel bypass 06/09/2016   2009ish  . Suicidal ideation    & HOMICIDAL IDEATION --  06-16-2013   ADMITTED TO BEHAVIOR HEALTH    Patient Active Problem List   Diagnosis Date Noted  . Uremia 03/08/2017  . Hemodialysis patient ([place]) 03/03/2017  . Weakness 02/28/2017  . Hypocalcemia 02/22/2017  . Shortness of breath 11/26/2016  . COPD (chronic obstructive pulmonary disease) ([place]) 10/30/2016  . COPD exacerbation ([place]) 10/29/2016  . Anemia   . Heme positive stool   . Ulceration of intestine   . Benign neoplasm of transverse colon   . Acute gastrointestinal hemorrhage   . Esophageal candidiasis ([place])   . Angiodysplasia of intestinal tract   . Acute respiratory failure with hypoxia ([place]) 07/03/2016  . GI bleeding 06/24/2016   . Rectal bleeding 06/14/2016  . Anemia of chronic disease 06/01/2016  . MRSA carrier 06/01/2016  . Chronic renal failure 05/23/2016  . Ischemic heart disease 05/23/2016  . Angiodysplasia of small intestine   . Melena   . Small bowel bleed not requiring more than 4 units of blood in 24 hours, ICU, or surgery   . Anemia due to chronic blood loss   . Abdominal pain 05/05/2016  . Acute posthemorrhagic anemia 04/17/2016  . Gastrointestinal bleed 04/17/2016  . History of esophagogastroduodenoscopy (EGD) 04/17/2016  . Elevated troponin 04/17/2016  . Alcohol abuse 04/17/2016  . Upper GI bleed 01/19/2016  . Blood in stool   . Angiodysplasia of stomach and duodenum with hemorrhage   . Gastritis   . Esophagitis, unspecified   . GI bleed 05/16/2015  . Acute GI bleeding   . Symptomatic anemia 04/30/2015  . HTN (hypertension) 04/06/2015  . GERD (gastroesophageal reflux disease) 04/06/2015  . HLD (hyperlipidemia) 04/06/2015  . Dyspnea 04/06/2015  . Cirrhosis of liver with ascites ([place]) 04/06/2015  . Ascites 04/06/2015  . GIB (gastrointestinal bleeding) 03/23/2015  . Homicidal ideation 06/19/2013  . Suicidal intent 06/19/2013  . Homicidal ideations 06/19/2013  . Hyperkalemia 06/16/2013  . Mandible fracture ([place]) 06/05/2013  . Fracture, mandible ([place]) 06/02/2013  . Coronary atherosclerosis of native coronary artery 06/02/2013  . ESRD on dialysis ([place]) 06/02/2013  . Mandible open fracture ([place]) 06/02/2013    Past Surgical History:  Procedure Laterality  Date  . Sonda Rumble CAPSULE N/A 06/19/2016   Procedure: AGILE CAPSULE;  Surgeon: [name], MD;  Location: [facility] ENDOSCOPY;  Service: Endoscopy;  Laterality: N/A;  . COLONOSCOPY WITH PROPOFOL N/A 06/18/2016   Procedure: COLONOSCOPY WITH PROPOFOL;  Surgeon: [name], MD;  Location: [facility] ENDOSCOPY;  Service: Endoscopy;  Laterality: N/A;  . COLONOSCOPY WITH PROPOFOL N/A 08/12/2016   Procedure: COLONOSCOPY WITH PROPOFOL;  Surgeon: [name], MD;   Location: [facility] ENDOSCOPY;  Service: Endoscopy;  Laterality: N/A;  . CORONARY ANGIOPLASTY  ?   PT UNABLE TO TELL IF  BEFORE OR AFTER  CABG  . CORONARY ARTERY BYPASS GRAFT  2008  ([place] , [place])   3 VESSEL  . DIALYSIS FISTULA CREATION  LAST SURGERY  APPOX  2008  . ENTEROSCOPY N/A 05/10/2016   Procedure: ENTEROSCOPY;  Surgeon: Jerene Bears, MD;  Location: [place];  Service: Gastroenterology;  Laterality: N/A;  . ENTEROSCOPY N/A 08/12/2016   Procedure: ENTEROSCOPY;  Surgeon: [name], MD;  Location: [facility] ENDOSCOPY;  Service: Endoscopy;  Laterality: N/A;  . ESOPHAGOGASTRODUODENOSCOPY N/A 05/07/2015   Procedure: ESOPHAGOGASTRODUODENOSCOPY (EGD);  Surgeon: Hulen Luster, MD;  Location: [facility] ENDOSCOPY;  Service: Endoscopy;  Laterality: N/A;  . ESOPHAGOGASTRODUODENOSCOPY (EGD) WITH PROPOFOL N/A 05/17/2015   Procedure: ESOPHAGOGASTRODUODENOSCOPY (EGD) WITH PROPOFOL;  Surgeon: [name], MD;  Location: [facility] ENDOSCOPY;  Service: Endoscopy;  Laterality: N/A;  . ESOPHAGOGASTRODUODENOSCOPY (EGD) WITH PROPOFOL N/A 01/20/2016   Procedure: ESOPHAGOGASTRODUODENOSCOPY (EGD) WITH PROPOFOL;  Surgeon: [name], MD;  Location: [facility] ENDOSCOPY;  Service: Endoscopy;  Laterality: N/A;  . ESOPHAGOGASTRODUODENOSCOPY (EGD) WITH PROPOFOL N/A 04/17/2016   Procedure: ESOPHAGOGASTRODUODENOSCOPY (EGD) WITH PROPOFOL;  Surgeon: Lin Landsman, MD;  Location: [facility] ENDOSCOPY;  Service: Gastroenterology;  Laterality: N/A;  . ESOPHAGOGASTRODUODENOSCOPY (EGD) WITH PROPOFOL  05/09/2016   Procedure: ESOPHAGOGASTRODUODENOSCOPY (EGD) WITH PROPOFOL;  Surgeon: Jerene Bears, MD;  Location: [place];  Service: Endoscopy;;  . ESOPHAGOGASTRODUODENOSCOPY (EGD) WITH PROPOFOL N/A 06/16/2016   Procedure: ESOPHAGOGASTRODUODENOSCOPY (EGD) WITH PROPOFOL;  Surgeon: [name], MD;  Location: [facility] ENDOSCOPY;  Service: Endoscopy;  Laterality: N/A;  . GIVENS CAPSULE STUDY N/A 05/07/2016   Procedure: GIVENS CAPSULE STUDY;  Surgeon: Doran Stabler,  MD;  Location: [place];  Service: Endoscopy;  Laterality: N/A;  . MANDIBULAR HARDWARE REMOVAL N/A 07/29/2013   Procedure: REMOVAL OF ARCH BARS;  Surgeon: Theodoro Kos, DO;  Location: [place];  Service: Plastics;  Laterality: N/A;  . ORIF MANDIBULAR FRACTURE N/A 06/05/2013   Procedure: REPAIR OF MANDIBULAR FRACTURE x 2 with maxillo-mandibular fixation ;  Surgeon: Theodoro Kos, DO;  Location: [place];  Service: Plastics;  Laterality: N/A;  . PERIPHERAL ARTERIAL STENT GRAFT Left     Allergies Patient has no known allergies.  Social History Social History   Tobacco Use  . Smoking status: Current Every Day Smoker    Packs/day: 0.15    Years: 40.00    Pack years: 6.00    Types: Cigarettes  . Smokeless tobacco: Never Used  Substance Use Topics  . Alcohol use: No    Comment: pt reports quitting after learning about cirrhosis  . Drug use: No    Review of Systems Constitutional: Negative for fever. Cardiovascular: Negative for chest pain. Respiratory: Negative for shortness of breath. Gastrointestinal: Negative for abdominal pain, vomiting and diarrhea. Musculoskeletal: Negative for back pain. Skin: Negative for rash. Neurological: Negative for headaches, focal weakness or numbness.  All systems negative/normal/unremarkable except as stated in the HPI  ____________________________________________   PHYSICAL EXAM:  VITAL SIGNS: ED Triage Vitals  Enc Vitals Group     BP 04/05/17 0854 108/65     Pulse Rate 04/05/17 0854 76     Resp 04/05/17 0953 11     Temp 04/05/17 0854 98.5 F (36.9 C)     Temp Source 04/05/17 0854 Oral     SpO2 04/05/17 0854 93 %     Weight 04/05/17 0850 165 lb (74.8 kg)     Height 04/05/17 0850 6\' 3"  (1.905 m)     Head Circumference --      Peak Flow --      Pain Score 04/05/17 0850 0     Pain Loc --      Pain Edu? --      Excl. in [place]? --     Constitutional: Alert and oriented. Well appearing and in no distress. Eyes:  Conjunctivae are normal. Normal extraocular movements. Cardiovascular: Normal rate, regular rhythm. No murmurs, rubs, or gallops. Respiratory: Normal respiratory effort without tachypnea nor retractions. Breath sounds are clear and equal bilaterally. No wheezes/rales/rhonchi. Gastrointestinal: Soft and nontender. Normal bowel sounds Musculoskeletal: Nontender with normal range of motion in extremities. No lower extremity tenderness nor edema. Neurologic:  Normal speech and language. No gross focal neurologic deficits are appreciated.  Skin:  Skin is warm, dry and intact. No rash noted. Psychiatric: Mood and affect are normal. Speech and behavior are normal.  ____________________________________________  ED COURSE:  As part of my medical decision making, I reviewed the following data within the [place] History obtained from family if available, nursing notes, old chart and ekg, as well as notes from prior ED visits. Patient presented for recheck concerning dialysis, we will assess with labs and imaging as indicated at this time.  Procedures ____________________________________________   LABS (pertinent positives/negatives)  Labs Reviewed  CBC WITH DIFFERENTIAL/PLATELET - Abnormal; Notable for the following components:      Result Value   RBC 3.13 (*)    Hemoglobin 8.4 (*)    HCT 26.9 (*)    MCHC 31.3 (*)    RDW 17.4 (*)    Lymphs Abs 0.6 (*)    All other components within normal limits  BASIC METABOLIC PANEL - Abnormal; Notable for the following components:   Sodium 134 (*)    Potassium 6.8 (*)    Chloride 99 (*)    Glucose, Bld 102 (*)    BUN 62 (*)    Creatinine, Ser 11.63 (*)    Calcium 7.4 (*)    GFR calc non Af Amer 4 (*)    GFR calc Af Amer 5 (*)    All other components within normal limits    ____________________________________________  DIFFERENTIAL DIAGNOSIS   End-stage renal disease requiring dialysis  FINAL ASSESSMENT AND  PLAN  End-stage renal disease   Plan: Patient had presented for recheck after dialysis.  He appears medically clear for discharge.   Earleen Newport, MD   Note: This note was generated in part or whole with voice recognition software. Voice recognition is usually quite accurate but there are transcription errors that can and very often do occur. I apologize for any typographical errors that were not detected and corrected.     Earleen Newport, MD 04/05/17 1435

## 2017-04-05 NOTE — Progress Notes (Signed)
Pre HD  

## 2017-04-05 NOTE — Progress Notes (Signed)
[facility], [place] 04/05/17  Subjective:  Patient found to have hyperkalemia in the emergency department. He left AGAINST MEDICAL ADVICE earlier this week. Patient on a 1 potassium bath.  Objective:  Vital signs in last 24 hours:  Temp:  [97.1 F (36.2 C)-98.5 F (36.9 C)] 97.1 F (36.2 C) (01/23 0953) Pulse Rate:  [76-82] 80 (01/23 1130) Resp:  [11-20] 13 (01/23 1130) BP: (92-128)/(59-78) 104/59 (01/23 1130) SpO2:  [93 %-100 %] 100 % (01/23 1130) Weight:  [74.8 kg (165 lb)] 74.8 kg (165 lb) (01/23 0850)  Weight change:  Filed Weights   04/05/17 0850  Weight: 74.8 kg (165 lb)    Intake/Output:   No intake or output data in the 24 hours ending 04/05/17 1203   Physical Exam: General: NAD laying in bed  HEENT Moist oral mucus membranes  Neck supple  Pulm/lungs clear  CVS/Heart regular  Abdomen:  Soft, distended   Extremities: + edema  Neurologic: Alert, oriented x 3  Skin: No acute rashes  Access: Arm AVF       Basic Metabolic Panel:  Recent Labs  Lab 04/01/17 0825 04/01/17 1547 04/02/17 0518 04/03/17 0410 04/05/17 0904  NA 135  --  136 136 134*  K 6.9* 4.2 5.2* 5.4* 6.8*  CL 95*  --  101 100* 99*  CO2 25  --  24 22 22   GLUCOSE 91  --  100* 96 102*  BUN 70*  --  36* 41* 62*  CREATININE 12.30*  --  8.14* 9.06* 11.63*  CALCIUM 8.0*  --  7.5* 7.6* 7.4*     CBC: Recent Labs  Lab 04/01/17 0825 04/02/17 0518 04/03/17 0410 04/05/17 0904  WBC 6.4 5.7  --  6.5  NEUTROABS  --   --   --  4.9  HGB 8.6* 7.0* 8.4* 8.4*  HCT 27.6* 22.9*  --  26.9*  MCV 84.1 84.4  --  86.0  PLT 405 289  --  302      Lab Results  Component Value Date   HEPBSAG Negative 03/01/2017   HEPBSAB Reactive 03/01/2017   HEPBIGM Negative 06/01/2016      Microbiology:  Recent Results (from the past 240 hour(s))  MRSA PCR Screening     Status: None   Collection Time: 04/01/17  9:56 PM  Result Value Ref Range Status   MRSA by PCR NEGATIVE  NEGATIVE Final    Comment:        The GeneXpert MRSA Assay (FDA approved for NASAL specimens only), is one component of a comprehensive MRSA colonization surveillance program. It is not intended to diagnose MRSA infection nor to guide or monitor treatment for MRSA infections. Performed at [facility], [address] [place], [place] [address]     Coagulation Studies: No results for input(s): LABPROT, INR in the last 72 hours.  Urinalysis: No results for input(s): COLORURINE, LABSPEC, PHURINE, GLUCOSEU, HGBUR, BILIRUBINUR, KETONESUR, PROTEINUR, UROBILINOGEN, NITRITE, LEUKOCYTESUR in the last 72 hours.  Invalid input(s): APPERANCEUR    Imaging: No results found.   Medications:       Assessment/ Plan:  57 y.o. male with end stage renal disease on hemodialysis secondary to Alport's syndrome, ascites, hypertension, anemia of chronic kidney disease, coronary artery disease, peripheral vascular disease, hyperlipidemia, gastrointestinal AVMs, congestive heart failure  [facility] TTS AVF- no outpatient dialysis unit currently  1. Hyperkalemia, K 6.8.  2. Anemia of chronic kidney disease 3. Secondary Hyperparathyroidism 4. Ascites 5. Hypertension 6. ESRD  Plan:  Patient presented with severe hyperkalemia.  Serum potassium was found to be 6.8.  Patient undergoing dialysis today and we are using a 1K bath.  Patient does not have an outpatient hemodialysis unit at the moment.  I have discussed this with staff and we will attempt to see if we can place him back in an outpatient dialysis unit.  He will likely need to go under a behavioral contract.    LOS: 0 Anthonette Legato 1/23/201912:03 PM  [facility] [place], [place]

## 2017-04-05 NOTE — Progress Notes (Signed)
HD complete 

## 2017-04-05 NOTE — ED Notes (Signed)
Pt states that he is here to get dialysis. Had last tx on Saturday. Pt was supposed to have dialysis Monday while in hospital but left prior to treatment happened. Denies any sx. Denies CP, SOB.

## 2017-04-05 NOTE — ED Notes (Signed)
Pt taken to dialysis 

## 2017-04-05 NOTE — ED Notes (Signed)
Pt alert and oriented X4, active, cooperative, pt in NAD. RR even and unlabored, color WNL.  Pt informed to return if any life threatening symptoms occur.  Discharge and followup instructions reviewed. Pt left with [facility].

## 2017-04-05 NOTE — ED Notes (Signed)
ED Provider at bedside. 

## 2017-04-05 NOTE — Progress Notes (Signed)
HD started. 

## 2017-04-05 NOTE — ED Notes (Signed)
Date and time results received: 04/05/17 9:46 AM   Test: potassium Critical Value: 6.8   Name of Provider Notified: Dr. Lenise Arena  Orders Received? Or Actions Taken?:

## 2017-04-05 NOTE — ED Notes (Signed)
Pt back from dialysis. Given sandwich tray at drink. Awaiting disposition.

## 2017-04-05 NOTE — ED Notes (Signed)
Pt taken to dialysis by this RN 

## 2017-04-05 NOTE — ED Triage Notes (Addendum)
Pt here for dialysis. Last had dialysis Saturday.

## 2017-04-07 ENCOUNTER — Emergency Department
Admission: EM | Admit: 2017-04-07 | Discharge: 2017-04-07 | Disposition: A | Discharge status: Home / Self Care | Payer: Medicare Other | Attending: Emergency Medicine | Admitting: Emergency Medicine

## 2017-04-07 ENCOUNTER — Other Ambulatory Visit: Payer: Self-pay

## 2017-04-07 DIAGNOSIS — R062 Wheezing: Secondary | ICD-10-CM | POA: Diagnosis not present

## 2017-04-07 DIAGNOSIS — Z992 Dependence on renal dialysis: Secondary | ICD-10-CM | POA: Insufficient documentation

## 2017-04-07 DIAGNOSIS — I10 Essential (primary) hypertension: Secondary | ICD-10-CM | POA: Diagnosis not present

## 2017-04-07 DIAGNOSIS — Z79899 Other long term (current) drug therapy: Secondary | ICD-10-CM | POA: Diagnosis not present

## 2017-04-07 DIAGNOSIS — I120 Hypertensive chronic kidney disease with stage 5 chronic kidney disease or end stage renal disease: Secondary | ICD-10-CM | POA: Diagnosis not present

## 2017-04-07 DIAGNOSIS — E1122 Type 2 diabetes mellitus with diabetic chronic kidney disease: Secondary | ICD-10-CM | POA: Insufficient documentation

## 2017-04-07 DIAGNOSIS — N2581 Secondary hyperparathyroidism of renal origin: Secondary | ICD-10-CM | POA: Diagnosis not present

## 2017-04-07 DIAGNOSIS — E875 Hyperkalemia: Secondary | ICD-10-CM | POA: Diagnosis not present

## 2017-04-07 DIAGNOSIS — I2510 Atherosclerotic heart disease of native coronary artery without angina pectoris: Secondary | ICD-10-CM | POA: Insufficient documentation

## 2017-04-07 DIAGNOSIS — E1140 Type 2 diabetes mellitus with diabetic neuropathy, unspecified: Secondary | ICD-10-CM | POA: Insufficient documentation

## 2017-04-07 DIAGNOSIS — I509 Heart failure, unspecified: Secondary | ICD-10-CM | POA: Diagnosis not present

## 2017-04-07 DIAGNOSIS — I132 Hypertensive heart and chronic kidney disease with heart failure and with stage 5 chronic kidney disease, or end stage renal disease: Secondary | ICD-10-CM | POA: Diagnosis not present

## 2017-04-07 DIAGNOSIS — R05 Cough: Secondary | ICD-10-CM | POA: Diagnosis not present

## 2017-04-07 DIAGNOSIS — F17210 Nicotine dependence, cigarettes, uncomplicated: Secondary | ICD-10-CM | POA: Diagnosis not present

## 2017-04-07 DIAGNOSIS — N186 End stage renal disease: Secondary | ICD-10-CM | POA: Diagnosis not present

## 2017-04-07 DIAGNOSIS — D631 Anemia in chronic kidney disease: Secondary | ICD-10-CM | POA: Diagnosis not present

## 2017-04-07 LAB — THC,MS,WB/SP RFX
Cannabidiol: NEGATIVE ng/mL
Cannabinoid Confirmation: POSITIVE
Cannabinol: NEGATIVE ng/mL
Carboxy-THC: 1 ng/mL
Hydroxy-THC: NEGATIVE ng/mL
Tetrahydrocannabinol(THC): NEGATIVE ng/mL

## 2017-04-07 NOTE — ED Triage Notes (Signed)
First Nurse Note:  Arrives to ED for dialysis.  AAOx3.  Skin warm and dry. NAD.  Spoke with dialysis attending, no orders given. Will alert dialysis unit that patient is here for treatment.

## 2017-04-07 NOTE — Progress Notes (Signed)
[place], [place] 04/07/17  Subjective:  Patient presented to the emergency department again. He still does not have an outpatient dialysis center. Earlier in the week he had severe hyperkalemia.  Objective:  Vital signs in last 24 hours:  Temp:  [97.8 F (36.6 C)] 97.8 F (36.6 C) (01/25 0856) Pulse Rate:  [82-87] 82 (01/25 1145) Resp:  [14-18] 14 (01/25 1145) BP: (112-130)/(66-75) 114/75 (01/25 1145) SpO2:  [96 %-100 %] 100 % (01/25 1145) Weight:  [74.8 kg (165 lb)] 74.8 kg (165 lb) (01/25 0901)  Weight change:  Filed Weights   04/07/17 0901  Weight: 74.8 kg (165 lb)    Intake/Output:   No intake or output data in the 24 hours ending 04/07/17 1302   Physical Exam: General: NAD sitting in chair  HEENT Moist oral mucus membranes  Neck supple  Pulm/lungs Clear bilateral  CVS/Heart Regular no rubs  Abdomen:  Soft, distended   Extremities: 1+ edema  Neurologic: Alert, oriented x 3  Skin: No acute rashes  Access: Arm AVF       Basic Metabolic Panel:  Recent Labs  Lab 04/01/17 0825 04/01/17 1547 04/02/17 0518 04/03/17 0410 04/05/17 0904  NA 135  --  136 136 134*  K 6.9* 4.2 5.2* 5.4* 6.8*  CL 95*  --  101 100* 99*  CO2 25  --  24 22 22   GLUCOSE 91  --  100* 96 102*  BUN 70*  --  36* 41* 62*  CREATININE 12.30*  --  8.14* 9.06* 11.63*  CALCIUM 8.0*  --  7.5* 7.6* 7.4*     CBC: Recent Labs  Lab 04/01/17 0825 04/02/17 0518 04/03/17 0410 04/05/17 0904  WBC 6.4 5.7  --  6.5  NEUTROABS  --   --   --  4.9  HGB 8.6* 7.0* 8.4* 8.4*  HCT 27.6* 22.9*  --  26.9*  MCV 84.1 84.4  --  86.0  PLT 405 289  --  302      Lab Results  Component Value Date   HEPBSAG Negative 03/01/2017   HEPBSAB Reactive 03/01/2017   HEPBIGM Negative 06/01/2016      Microbiology:  Recent Results (from the past 240 hour(s))  MRSA PCR Screening     Status: None   Collection Time: 04/01/17  9:56 PM  Result Value Ref Range Status   MRSA by  PCR NEGATIVE NEGATIVE Final    Comment:        The GeneXpert MRSA Assay (FDA approved for NASAL specimens only), is one component of a comprehensive MRSA colonization surveillance program. It is not intended to diagnose MRSA infection nor to guide or monitor treatment for MRSA infections. Performed at [facility], [address] [place], [place] [address]     Coagulation Studies: No results for input(s): LABPROT, INR in the last 72 hours.  Urinalysis: No results for input(s): COLORURINE, LABSPEC, PHURINE, GLUCOSEU, HGBUR, BILIRUBINUR, KETONESUR, PROTEINUR, UROBILINOGEN, NITRITE, LEUKOCYTESUR in the last 72 hours.  Invalid input(s): APPERANCEUR    Imaging: No results found.   Medications:       Assessment/ Plan:  57 y.o. male with end stage renal disease on hemodialysis secondary to Alport's syndrome, ascites, hypertension, anemia of chronic kidney disease, coronary artery disease, peripheral vascular disease, hyperlipidemia, gastrointestinal AVMs, congestive heart failure  [facility] TTS AVF- no outpatient dialysis unit currently  1. Hyperkalemia 2. Anemia of chronic kidney disease 3. Secondary Hyperparathyroidism 4. Ascites 5. Hypertension 6. ESRD  Plan:  She still does not have an outpatient dialysis center to go to.  Earlier in the week his potassium was quite high at 6.8 but he has had dialysis subsequently.  We will recheck serum potassium today.  Patient will complete hemodialysis today.  We are in the process of seeing if we can get him readmitted to 1 of the local dialysis units under a behavioral contract.  Further plan as patient progresses.    LOS: 0 Anthonette Legato 1/25/20191:02 PM  [facility] [place], [place]

## 2017-04-07 NOTE — ED Triage Notes (Addendum)
Arrived today for dialysis, denies any complaints. Pt ambulatory.  Alert and oriented x4. Last tx on Wed

## 2017-04-07 NOTE — ED Provider Notes (Signed)
[facility] Emergency Department Provider Note  ____________________________________________  Time seen: Approximately 9:01 AM  I have reviewed the triage vital signs and the nursing notes.   HISTORY  Chief Complaint Needs Dialysis    HPI Stanley [name] is a 57 y.o. male with a history of ESRD on HD, CHF and CAD, alcohol abuse, presenting for dialysis.  The patient was last dialyzed 04/05/17.  He has no medical complaints at this time.  Past Medical History:  Diagnosis Date  . Alcohol abuse   . CHF (congestive heart failure) ([place])   . Cirrhosis ([place])   . Coronary artery disease 2009  . Diabetic peripheral neuropathy ([place])   . Drug abuse ([place])   . End stage renal disease on dialysis [facility]) NEPHROLOGIST-   [facility]  IN [place]   HEMODIALYSIS --   TUES/  THURS/  SAT  . GERD (gastroesophageal reflux disease)   . Hyperlipidemia   . Hypertension   . PAD (peripheral artery disease) ([place])   . Renal insufficiency    Per pt, 32 oz fluid restriction per day  . S/P triple vessel bypass 06/09/2016   2009ish  . Suicidal ideation    & HOMICIDAL IDEATION --  06-16-2013   ADMITTED TO BEHAVIOR HEALTH    Patient Active Problem List   Diagnosis Date Noted  . Uremia 03/08/2017  . Hemodialysis patient ([place]) 03/03/2017  . Weakness 02/28/2017  . Hypocalcemia 02/22/2017  . Shortness of breath 11/26/2016  . COPD (chronic obstructive pulmonary disease) ([place]) 10/30/2016  . COPD exacerbation ([place]) 10/29/2016  . Anemia   . Heme positive stool   . Ulceration of intestine   . Benign neoplasm of transverse colon   . Acute gastrointestinal hemorrhage   . Esophageal candidiasis ([place])   . Angiodysplasia of intestinal tract   . Acute respiratory failure with hypoxia ([place]) 07/03/2016  . GI bleeding 06/24/2016  . Rectal bleeding 06/14/2016  . Anemia of chronic disease 06/01/2016  . MRSA carrier 06/01/2016  . Chronic renal failure 05/23/2016  . Ischemic heart disease  05/23/2016  . Angiodysplasia of small intestine   . Melena   . Small bowel bleed not requiring more than 4 units of blood in 24 hours, ICU, or surgery   . Anemia due to chronic blood loss   . Abdominal pain 05/05/2016  . Acute posthemorrhagic anemia 04/17/2016  . Gastrointestinal bleed 04/17/2016  . History of esophagogastroduodenoscopy (EGD) 04/17/2016  . Elevated troponin 04/17/2016  . Alcohol abuse 04/17/2016  . Upper GI bleed 01/19/2016  . Blood in stool   . Angiodysplasia of stomach and duodenum with hemorrhage   . Gastritis   . Esophagitis, unspecified   . GI bleed 05/16/2015  . Acute GI bleeding   . Symptomatic anemia 04/30/2015  . HTN (hypertension) 04/06/2015  . GERD (gastroesophageal reflux disease) 04/06/2015  . HLD (hyperlipidemia) 04/06/2015  . Dyspnea 04/06/2015  . Cirrhosis of liver with ascites ([place]) 04/06/2015  . Ascites 04/06/2015  . GIB (gastrointestinal bleeding) 03/23/2015  . Homicidal ideation 06/19/2013  . Suicidal intent 06/19/2013  . Homicidal ideations 06/19/2013  . Hyperkalemia 06/16/2013  . Mandible fracture ([place]) 06/05/2013  . Fracture, mandible ([place]) 06/02/2013  . Coronary atherosclerosis of native coronary artery 06/02/2013  . ESRD on dialysis ([place]) 06/02/2013  . Mandible open fracture ([place]) 06/02/2013    Past Surgical History:  Procedure Laterality Date  . AGILE CAPSULE N/A 06/19/2016   Procedure: AGILE CAPSULE;  Surgeon: [name], MD;  Location: [facility] ENDOSCOPY;  Service: Endoscopy;  Laterality: N/A;  . COLONOSCOPY WITH PROPOFOL N/A 06/18/2016   Procedure: COLONOSCOPY WITH PROPOFOL;  Surgeon: [name], MD;  Location: [facility] ENDOSCOPY;  Service: Endoscopy;  Laterality: N/A;  . COLONOSCOPY WITH PROPOFOL N/A 08/12/2016   Procedure: COLONOSCOPY WITH PROPOFOL;  Surgeon: [name], MD;  Location: [facility] ENDOSCOPY;  Service: Endoscopy;  Laterality: N/A;  . CORONARY ANGIOPLASTY  ?   PT UNABLE TO TELL IF  BEFORE OR AFTER  CABG  . CORONARY ARTERY  BYPASS GRAFT  2008  ([place] , [place])   3 VESSEL  . DIALYSIS FISTULA CREATION  LAST SURGERY  APPOX  2008  . ENTEROSCOPY N/A 05/10/2016   Procedure: ENTEROSCOPY;  Surgeon: Jerene Bears, MD;  Location: [place];  Service: Gastroenterology;  Laterality: N/A;  . ENTEROSCOPY N/A 08/12/2016   Procedure: ENTEROSCOPY;  Surgeon: [name], MD;  Location: [facility] ENDOSCOPY;  Service: Endoscopy;  Laterality: N/A;  . ESOPHAGOGASTRODUODENOSCOPY N/A 05/07/2015   Procedure: ESOPHAGOGASTRODUODENOSCOPY (EGD);  Surgeon: Hulen Luster, MD;  Location: [facility] ENDOSCOPY;  Service: Endoscopy;  Laterality: N/A;  . ESOPHAGOGASTRODUODENOSCOPY (EGD) WITH PROPOFOL N/A 05/17/2015   Procedure: ESOPHAGOGASTRODUODENOSCOPY (EGD) WITH PROPOFOL;  Surgeon: [name], MD;  Location: [facility] ENDOSCOPY;  Service: Endoscopy;  Laterality: N/A;  . ESOPHAGOGASTRODUODENOSCOPY (EGD) WITH PROPOFOL N/A 01/20/2016   Procedure: ESOPHAGOGASTRODUODENOSCOPY (EGD) WITH PROPOFOL;  Surgeon: [name], MD;  Location: [facility] ENDOSCOPY;  Service: Endoscopy;  Laterality: N/A;  . ESOPHAGOGASTRODUODENOSCOPY (EGD) WITH PROPOFOL N/A 04/17/2016   Procedure: ESOPHAGOGASTRODUODENOSCOPY (EGD) WITH PROPOFOL;  Surgeon: Lin Landsman, MD;  Location: [facility] ENDOSCOPY;  Service: Gastroenterology;  Laterality: N/A;  . ESOPHAGOGASTRODUODENOSCOPY (EGD) WITH PROPOFOL  05/09/2016   Procedure: ESOPHAGOGASTRODUODENOSCOPY (EGD) WITH PROPOFOL;  Surgeon: Jerene Bears, MD;  Location: [place];  Service: Endoscopy;;  . ESOPHAGOGASTRODUODENOSCOPY (EGD) WITH PROPOFOL N/A 06/16/2016   Procedure: ESOPHAGOGASTRODUODENOSCOPY (EGD) WITH PROPOFOL;  Surgeon: [name], MD;  Location: [facility] ENDOSCOPY;  Service: Endoscopy;  Laterality: N/A;  . GIVENS CAPSULE STUDY N/A 05/07/2016   Procedure: GIVENS CAPSULE STUDY;  Surgeon: Doran Stabler, MD;  Location: [place];  Service: Endoscopy;  Laterality: N/A;  . MANDIBULAR HARDWARE REMOVAL N/A 07/29/2013   Procedure: REMOVAL OF ARCH BARS;  Surgeon:  Theodoro Kos, DO;  Location: [place];  Service: Plastics;  Laterality: N/A;  . ORIF MANDIBULAR FRACTURE N/A 06/05/2013   Procedure: REPAIR OF MANDIBULAR FRACTURE x 2 with maxillo-mandibular fixation ;  Surgeon: Theodoro Kos, DO;  Location: [place];  Service: Plastics;  Laterality: N/A;  . PERIPHERAL ARTERIAL STENT GRAFT Left     Current Outpatient Rx  . Order #: [id number] Class: Print  . Order #: [id number] Class: Print  . Order #: [id number] Class: Print  . Order #: [id number] Class: Print  . Order #: [id number] Class: Historical Med  . Order #: [id number] Class: Historical Med  . Order #: [id number] Class: Historical Med  . Order #: [id number] Class: Print  . Order #: [id number] Class: Historical Med  . Order #: [id number] Class: Print  . Order #: [id number] Class: Historical Med  . Order #: [id number] Class: Normal  . Order #: [id number] Class: Print  . Order #: [id number] Class: Normal  . Order #: [id number] Class: Print  . Order #: [id number] Class: Print  . Order #: [id number] Class: Print    Allergies Patient has no known allergies.  Family History  Problem Relation Age of Onset  . Colon cancer Mother   . Cancer Father   . Cancer Sister   . Kidney disease Brother     Social History Social History  Tobacco Use  . Smoking status: Current Every Day Smoker    Packs/day: 0.15    Years: 40.00    Pack years: 6.00    Types: Cigarettes  . Smokeless tobacco: Never Used  Substance Use Topics  . Alcohol use: No    Comment: pt reports quitting after learning about cirrhosis  . Drug use: No    Review of Systems Constitutional: No fever/chills.  No lightheadedness or syncope. Eyes: No visual changes. ENT:  No congestion or rhinorrhea. Cardiovascular: Denies chest pain. Denies palpitations. Respiratory: Denies shortness of breath.  No cough. Gastrointestinal: No abdominal pain.  No nausea, no vomiting.  No diarrhea.  No constipation. Musculoskeletal: No extremity pain. Skin:  Negative for rash. Neurological: Negative for headaches.    ____________________________________________   PHYSICAL EXAM:  VITAL SIGNS: ED Triage Vitals  Enc Vitals Group     BP 04/07/17 0856 130/74     Pulse Rate 04/07/17 0856 87     Resp 04/07/17 0856 18     Temp 04/07/17 0856 97.8 F (36.6 C)     Temp Source 04/07/17 0856 Oral     SpO2 04/07/17 0856 96 %     Weight 04/07/17 0901 165 lb (74.8 kg)     Height 04/07/17 0901 6\' 3"  (1.905 m)     Head Circumference --      Peak Flow --      Pain Score 04/07/17 0901 0     Pain Loc --      Pain Edu? --      Excl. in [place]? --     Constitutional: Alert and oriented.  Chronically ill appearing but in no acute distress. Answers questions appropriately. Eyes: Conjunctivae are normal.  EOMI. No scleral icterus. Head: Atraumatic. Nose: No congestion/rhinnorhea. Mouth/Throat: Mucous membranes are moist.  Neck: No stridor.  Supple.  No JVD Cardiovascular: Normal rate, regular rhythm. No murmurs, rubs or gallops.  Respiratory: Normal respiratory effort.  No accessory muscle use or retractions. Lungs CTAB.  No wheezes, rales or ronchi. Musculoskeletal: No LE edema. Neurologic:  A&Ox3.  Speech is clear.  Face and smile are symmetric.  EOMI.  Moves all extremities well. Skin:  Skin is warm, dry and intact. No rash noted. Psychiatric: Mood and affect are normal. Speech and behavior are normal.  Normal judgement.  ____________________________________________   LABS (all labs ordered are listed, but only abnormal results are displayed)  Labs Reviewed - No data to display ____________________________________________  EKG  Not indicated ____________________________________________  RADIOLOGY  No results found.  ____________________________________________   PROCEDURES  Procedure(s) performed: None  Procedures  Critical Care performed: No ____________________________________________   INITIAL IMPRESSION / ASSESSMENT AND  PLAN / ED COURSE  Pertinent labs & imaging results that were available during my care of the patient were reviewed by me and considered in my medical decision making (see chart for details).  57 y.o. male with ESRD on HD presenting for hemodialysis after last being dialyzed 2 days ago.  The patient has no medical complaints at this time.  We have contacted the dialysis center and he will have dialysis performed today.  ____________________________________________  FINAL CLINICAL IMPRESSION(S) / ED DIAGNOSES  Final diagnoses:  End stage renal disease ([place])         NEW MEDICATIONS STARTED DURING THIS VISIT:  New Prescriptions   No medications on file      Eula Listen, MD 04/07/17 [id number]

## 2017-04-07 NOTE — Discharge Instructions (Signed)
Department in 2 days to have your repeat dialysis.  Return sooner if you develop chest pain, shortness of breath, lightheadedness or fainting, palpitations, fever or chills, or any other symptoms concerning to you.

## 2017-04-07 NOTE — ED Notes (Signed)
Pt transported to dialysis by Radonna Ricker, RN

## 2017-04-10 ENCOUNTER — Ambulatory Visit
Admission: RE | Admit: 2017-04-10 | Discharge: 2017-04-10 | Disposition: A | Discharge status: Home / Self Care | Payer: Medicare Other | Source: Ambulatory Visit | Attending: Nephrology | Admitting: Nephrology

## 2017-04-10 ENCOUNTER — Encounter: Payer: Self-pay | Admitting: Medical Oncology

## 2017-04-10 ENCOUNTER — Ambulatory Visit
Admission: EM | Admit: 2017-04-10 | Discharge: 2017-04-10 | Disposition: A | Discharge status: Home / Self Care | Payer: Medicare Other | Attending: Emergency Medicine | Admitting: Emergency Medicine

## 2017-04-10 ENCOUNTER — Emergency Department: Payer: Medicare Other

## 2017-04-10 DIAGNOSIS — I2510 Atherosclerotic heart disease of native coronary artery without angina pectoris: Secondary | ICD-10-CM | POA: Insufficient documentation

## 2017-04-10 DIAGNOSIS — E1122 Type 2 diabetes mellitus with diabetic chronic kidney disease: Secondary | ICD-10-CM | POA: Diagnosis not present

## 2017-04-10 DIAGNOSIS — K7460 Unspecified cirrhosis of liver: Secondary | ICD-10-CM | POA: Diagnosis not present

## 2017-04-10 DIAGNOSIS — F17210 Nicotine dependence, cigarettes, uncomplicated: Secondary | ICD-10-CM | POA: Diagnosis not present

## 2017-04-10 DIAGNOSIS — N186 End stage renal disease: Secondary | ICD-10-CM | POA: Diagnosis not present

## 2017-04-10 DIAGNOSIS — Z992 Dependence on renal dialysis: Secondary | ICD-10-CM | POA: Insufficient documentation

## 2017-04-10 DIAGNOSIS — J449 Chronic obstructive pulmonary disease, unspecified: Secondary | ICD-10-CM | POA: Insufficient documentation

## 2017-04-10 DIAGNOSIS — I509 Heart failure, unspecified: Secondary | ICD-10-CM | POA: Diagnosis not present

## 2017-04-10 DIAGNOSIS — R188 Other ascites: Secondary | ICD-10-CM

## 2017-04-10 DIAGNOSIS — K219 Gastro-esophageal reflux disease without esophagitis: Secondary | ICD-10-CM | POA: Diagnosis not present

## 2017-04-10 DIAGNOSIS — E1151 Type 2 diabetes mellitus with diabetic peripheral angiopathy without gangrene: Secondary | ICD-10-CM | POA: Insufficient documentation

## 2017-04-10 DIAGNOSIS — R9431 Abnormal electrocardiogram [ECG] [EKG]: Secondary | ICD-10-CM | POA: Insufficient documentation

## 2017-04-10 DIAGNOSIS — R05 Cough: Secondary | ICD-10-CM

## 2017-04-10 DIAGNOSIS — R059 Cough, unspecified: Secondary | ICD-10-CM

## 2017-04-10 DIAGNOSIS — D631 Anemia in chronic kidney disease: Secondary | ICD-10-CM | POA: Insufficient documentation

## 2017-04-10 DIAGNOSIS — N2581 Secondary hyperparathyroidism of renal origin: Secondary | ICD-10-CM | POA: Insufficient documentation

## 2017-04-10 DIAGNOSIS — E785 Hyperlipidemia, unspecified: Secondary | ICD-10-CM | POA: Diagnosis not present

## 2017-04-10 DIAGNOSIS — E1142 Type 2 diabetes mellitus with diabetic polyneuropathy: Secondary | ICD-10-CM | POA: Insufficient documentation

## 2017-04-10 DIAGNOSIS — R509 Fever, unspecified: Secondary | ICD-10-CM | POA: Diagnosis not present

## 2017-04-10 DIAGNOSIS — I120 Hypertensive chronic kidney disease with stage 5 chronic kidney disease or end stage renal disease: Secondary | ICD-10-CM | POA: Diagnosis not present

## 2017-04-10 DIAGNOSIS — I132 Hypertensive heart and chronic kidney disease with heart failure and with stage 5 chronic kidney disease, or end stage renal disease: Secondary | ICD-10-CM | POA: Insufficient documentation

## 2017-04-10 DIAGNOSIS — Z951 Presence of aortocoronary bypass graft: Secondary | ICD-10-CM | POA: Diagnosis not present

## 2017-04-10 DIAGNOSIS — E875 Hyperkalemia: Secondary | ICD-10-CM | POA: Diagnosis not present

## 2017-04-10 DIAGNOSIS — R062 Wheezing: Secondary | ICD-10-CM | POA: Diagnosis not present

## 2017-04-10 LAB — OPIATES,MS,WB/SP RFX
6-ACETYLMORPHINE: NEGATIVE
CODEINE: NEGATIVE ng/mL
DIHYDROCODEINE: NEGATIVE ng/mL
Hydrocodone: NEGATIVE ng/mL
Hydromorphone: NEGATIVE ng/mL
MORPHINE: NEGATIVE ng/mL
Opiate Confirmation: NEGATIVE

## 2017-04-10 LAB — BASIC METABOLIC PANEL
ANION GAP: 13 (ref 5–15)
BUN: 78 mg/dL — ABNORMAL HIGH (ref 6–20)
CALCIUM: 8.7 mg/dL — AB (ref 8.9–10.3)
CO2: 23 mmol/L (ref 22–32)
Chloride: 102 mmol/L (ref 101–111)
Creatinine, Ser: 10.22 mg/dL — ABNORMAL HIGH (ref 0.61–1.24)
GFR, EST AFRICAN AMERICAN: 6 mL/min — AB (ref >60)
GFR, EST NON AFRICAN AMERICAN: 5 mL/min — AB (ref >60)
Glucose, Bld: 104 mg/dL — ABNORMAL HIGH (ref 65–99)
Potassium: 7.3 mmol/L (ref 3.5–5.1)
Sodium: 138 mmol/L (ref 135–145)

## 2017-04-10 LAB — DRUG SCREEN 10 W/CONF, SERUM
AMPHETAMINES, IA: NEGATIVE ng/mL
Barbiturates, IA: NEGATIVE ug/mL
Benzodiazepines, IA: NEGATIVE ng/mL
COCAINE & METABOLITE, IA: POSITIVE ng/mL
Methadone, IA: NEGATIVE ng/mL
Opiates, IA: NEGATIVE ng/mL
Oxycodones, IA: NEGATIVE ng/mL
Phencyclidine, IA: NEGATIVE ng/mL
Propoxyphene, IA: NEGATIVE ng/mL
THC(Marijuana) Metabolite, IA: POSITIVE ng/mL

## 2017-04-10 LAB — CBC
HEMATOCRIT: 28.3 % — AB (ref 40.0–52.0)
Hemoglobin: 8.6 g/dL — ABNORMAL LOW (ref 13.0–18.0)
MCH: 26.5 pg (ref 26.0–34.0)
MCHC: 30.5 g/dL — ABNORMAL LOW (ref 32.0–36.0)
MCV: 86.7 fL (ref 80.0–100.0)
Platelets: 281 10*3/uL (ref 150–440)
RBC: 3.26 MIL/uL — ABNORMAL LOW (ref 4.40–5.90)
RDW: 18.5 % — AB (ref 11.5–14.5)
WBC: 5.5 10*3/uL (ref 3.8–10.6)

## 2017-04-10 LAB — COCAINE,MS,WB/SP RFX
COCAINE: NEGATIVE ng/mL
Cocaine Confirmation: POSITIVE

## 2017-04-10 LAB — BRAIN NATRIURETIC PEPTIDE: B NATRIURETIC PEPTIDE 5: 3048 pg/mL — AB (ref 0.0–100.0)

## 2017-04-10 LAB — TROPONIN I

## 2017-04-10 MED ORDER — ALBUMIN HUMAN 25 % IV SOLN
INTRAVENOUS | Status: AC
  Filled 2017-04-10: qty 100

## 2017-04-10 MED ORDER — SODIUM CHLORIDE FLUSH 0.9 % IV SOLN
INTRAVENOUS | Status: AC
  Filled 2017-04-10: qty 10

## 2017-04-10 MED ORDER — EPOETIN ALFA 10000 UNIT/ML IJ SOLN: 10000.0000 [IU] | Freq: Once | INTRAMUSCULAR | Status: DC

## 2017-04-10 MED ORDER — ALBUMIN HUMAN 25 % IV SOLN
25.0000 g | Freq: Once | INTRAVENOUS | Status: AC
  Administered 2017-04-10: 25 g via INTRAVENOUS
  Filled 2017-04-10: qty 100

## 2017-04-10 MED ORDER — IPRATROPIUM-ALBUTEROL 0.5-2.5 (3) MG/3ML IN SOLN: 3.0000 mL | Freq: Once | RESPIRATORY_TRACT | Status: DC

## 2017-04-10 NOTE — Progress Notes (Signed)
Post HD Tx  

## 2017-04-10 NOTE — ED Notes (Signed)
edp at bedside. Pt decided to leave AMA and refused to sign ama paperwork.

## 2017-04-10 NOTE — Progress Notes (Signed)
HD started. 

## 2017-04-10 NOTE — Progress Notes (Signed)
[facility], [place] 04/10/17  Subjective:   Ultrasound guided paracentesis today. 4.5 liters removed.   Objective:  Vital signs in last 24 hours:  Temp:  [98.2 F (36.8 C)] 98.2 F (36.8 C) (01/28 0941) Pulse Rate:  [77-88] 82 (01/28 1630) Resp:  [14-31] 23 (01/28 1630) BP: (105-131)/(69-92) 118/71 (01/28 1630) SpO2:  [99 %-100 %] 100 % (01/28 1630) Weight:  [74.8 kg (165 lb)] 74.8 kg (165 lb) (01/28 0942)  Weight change:  Filed Weights   04/10/17 0942  Weight: 74.8 kg (165 lb)    Intake/Output:    Intake/Output Summary (Last 24 hours) at 04/10/2017 2050 Last data filed at 04/10/2017 1630 Gross per 24 hour  Intake -  Output 1217 ml  Net -1217 ml     Physical Exam: General: NAD sitting in chair  HEENT Moist oral mucus membranes  Neck supple  Pulm/lungs Clear bilateral  CVS/Heart Regular no rubs  Abdomen:  Soft  Extremities: No  edema  Neurologic: Alert, oriented x 3  Skin: No acute rashes  Access: Arm AVF       Basic Metabolic Panel:  Recent Labs  Lab 04/05/17 0904 04/10/17 1157  NA 134* 138  K 6.8* 7.3*  CL 99* 102  CO2 22 23  GLUCOSE 102* 104*  BUN 62* 78*  CREATININE 11.63* 10.22*  CALCIUM 7.4* 8.7*     CBC: Recent Labs  Lab 04/05/17 0904 04/10/17 1157  WBC 6.5 5.5  NEUTROABS 4.9  --   HGB 8.4* 8.6*  HCT 26.9* 28.3*  MCV 86.0 86.7  PLT 302 281      Lab Results  Component Value Date   HEPBSAG Negative 03/01/2017   HEPBSAB Reactive 03/01/2017   HEPBIGM Negative 06/01/2016      Microbiology:  Recent Results (from the past 240 hour(s))  MRSA PCR Screening     Status: None   Collection Time: 04/01/17  9:56 PM  Result Value Ref Range Status   MRSA by PCR NEGATIVE NEGATIVE Final    Comment:        The GeneXpert MRSA Assay (FDA approved for NASAL specimens only), is one component of a comprehensive MRSA colonization surveillance program. It is not intended to diagnose MRSA infection nor to  guide or monitor treatment for MRSA infections. Performed at [facility], [address] [place], [place] [address]     Coagulation Studies: No results for input(s): LABPROT, INR in the last 72 hours.  Urinalysis: No results for input(s): COLORURINE, LABSPEC, PHURINE, GLUCOSEU, HGBUR, BILIRUBINUR, KETONESUR, PROTEINUR, UROBILINOGEN, NITRITE, LEUKOCYTESUR in the last 72 hours.  Invalid input(s): APPERANCEUR    Imaging: Dg Chest 2 View  Result Date: 04/10/2017 CLINICAL DATA:  CHF.  End-stage renal disease. EXAM: CHEST  2 VIEW COMPARISON:  04/01/2017 FINDINGS: Stable surgical changes from bypass surgery. The heart is borderline enlarged but stable. Stable tortuosity and calcification of the thoracic aorta. Mild vascular congestion without overt pulmonary edema. No pleural effusions. No focal infiltrates. Healing right lower rib fractures are noted. IMPRESSION: Mild cardiac enlargement and vascular congestion without overt pulmonary edema or pleural effusion. Electronically Signed   By: Marijo Sanes M.D.   On: 04/10/2017 11:41   US Paracentesis  Result Date: 04/10/2017 INDICATION: History of recurrent symptomatic ascites. Please perform ultrasound-guided paracentesis for therapeutic purposes. EXAM: ULTRASOUND-GUIDED PARACENTESIS COMPARISON:  Multiple previous ultrasound-guided paracenteses, most recently on 03/27/2017 MEDICATIONS: None. COMPLICATIONS: None immediate. TECHNIQUE: Informed written consent was obtained from the patient after a discussion of  the risks, benefits and alternatives to treatment. A timeout was performed prior to the initiation of the procedure. Initial ultrasound scanning demonstrates a large amount of ascites within the right lower abdominal quadrant. The right lower abdomen was prepped and draped in the usual sterile fashion. 1% lidocaine with epinephrine was used for local anesthesia. An ultrasound image was saved for documentation purposed. An 8 Fr  Safe-T-Centesis catheter was introduced. The paracentesis was performed. The catheter was removed and a dressing was applied. The patient tolerated the procedure well without immediate post procedural complication. FINDINGS: A total of approximately 4.5 liters of serous fluid was removed. IMPRESSION: Successful ultrasound-guided paracentesis yielding 4.5 liters of peritoneal fluid. Electronically Signed   By: Sandi Mariscal M.D.   On: 04/10/2017 14:21     Medications:       Assessment/ Plan:  57 y.o. male with end stage renal disease on hemodialysis secondary to Alport's syndrome, ascites, hypertension, anemia of chronic kidney disease, coronary artery disease, peripheral vascular disease, hyperlipidemia, gastrointestinal AVMs, congestive heart failure  [facility] TTS AVF- no outpatient dialysis unit currently  1. Hyperkalemia 2. Anemia of chronic kidney disease 3. Secondary Hyperparathyroidism 4. Ascites 5. Hypertension 6. ESRD  Plan:  Seen on hemodialysis. Tolerating treatment well.  Ultrasound guided paracentesis earlier today.     LOS: 0 Lavonia Dana 1/28/20198:50 PM  [place], [place]

## 2017-04-10 NOTE — Discharge Instructions (Signed)
Today you are leaving [facility].  We have discussed that your symptoms can worsen, or you can develop a life threatening disease and die.  Return to the emergency department for any symptoms concerning to you.

## 2017-04-10 NOTE — Progress Notes (Signed)
Chaplain was rounding and Nurse Tech asked if Ch could assist Pt who is agitated. Ch attempted to offer support. Ch helped until Dr came and the Pt left against Dr. Deirdre Pippins.     04/10/17 1300  Clinical Encounter Type  Visited With Patient  Visit Type Initial;Spiritual support  Referral From Nurse  Spiritual Encounters  Spiritual Needs Emotional

## 2017-04-10 NOTE — ED Notes (Addendum)
Per dialysis nurse pt would have dialysis sometime after 1300, she was unsure when. This was related to edp and pt. Pt related that he had paracentesis at 1300 and wanted to leave. edp notified.

## 2017-04-10 NOTE — Progress Notes (Signed)
Pre HD  

## 2017-04-10 NOTE — Progress Notes (Signed)
HD Tx ended  

## 2017-04-10 NOTE — ED Triage Notes (Signed)
Pt here for dialysis, last HD was Friday. Denies complaints.

## 2017-04-10 NOTE — Procedures (Signed)
Pre Procedural Dx: Symptomatic Ascites Post Procedural Dx: Same  Successful US guided paracentesis yielding 4.5 L of serous ascitic fluid.  EBL: None  Complications: None immediate  Ronny Bacon, MD Pager #: [phone]

## 2017-04-10 NOTE — ED Provider Notes (Signed)
[facility] Emergency Department Provider Note  ____________________________________________  Time seen: Approximately 11:11 AM  I have reviewed the triage vital signs and the nursing notes.   HISTORY  Chief Complaint Dialysis    HPI Stanley [name] is a 57 y.o. male with ESRD on HD, cirrhosis, CAD and CHF, ongoing alcohol dependence, presenting for dialysis, but also noting that he has had a new cough with subjective fever since yesterday.  The patient denies any sore throat, ear pain, lightheadedness or syncope.  He felt hot yesterday, but did not take his temperature.  He denies any shortness of breath, lightheadedness or syncope.  He states he has tried nebulizer treatments in the past "but they do not work anymore."  Swelling or calf pain.  Past Medical History:  Diagnosis Date  . Alcohol abuse   . CHF (congestive heart failure) ([place])   . Cirrhosis ([place])   . Coronary artery disease 2009  . Diabetic peripheral neuropathy ([place])   . Drug abuse ([place])   . End stage renal disease on dialysis [facility]) NEPHROLOGIST-   DR [facility]  IN [place]   HEMODIALYSIS --   TUES/  THURS/  SAT  . GERD (gastroesophageal reflux disease)   . Hyperlipidemia   . Hypertension   . PAD (peripheral artery disease) ([place])   . Renal insufficiency    Per pt, 32 oz fluid restriction per day  . S/P triple vessel bypass 06/09/2016   2009ish  . Suicidal ideation    & HOMICIDAL IDEATION --  06-16-2013   ADMITTED TO BEHAVIOR HEALTH    Patient Active Problem List   Diagnosis Date Noted  . Uremia 03/08/2017  . Hemodialysis patient ([place]) 03/03/2017  . Weakness 02/28/2017  . Hypocalcemia 02/22/2017  . Shortness of breath 11/26/2016  . COPD (chronic obstructive pulmonary disease) ([place]) 10/30/2016  . COPD exacerbation ([place]) 10/29/2016  . Anemia   . Heme positive stool   . Ulceration of intestine   . Benign neoplasm of transverse colon   . Acute gastrointestinal hemorrhage   .  Esophageal candidiasis ([place])   . Angiodysplasia of intestinal tract   . Acute respiratory failure with hypoxia ([place]) 07/03/2016  . GI bleeding 06/24/2016  . Rectal bleeding 06/14/2016  . Anemia of chronic disease 06/01/2016  . MRSA carrier 06/01/2016  . Chronic renal failure 05/23/2016  . Ischemic heart disease 05/23/2016  . Angiodysplasia of small intestine   . Melena   . Small bowel bleed not requiring more than 4 units of blood in 24 hours, ICU, or surgery   . Anemia due to chronic blood loss   . Abdominal pain 05/05/2016  . Acute posthemorrhagic anemia 04/17/2016  . Gastrointestinal bleed 04/17/2016  . History of esophagogastroduodenoscopy (EGD) 04/17/2016  . Elevated troponin 04/17/2016  . Alcohol abuse 04/17/2016  . Upper GI bleed 01/19/2016  . Blood in stool   . Angiodysplasia of stomach and duodenum with hemorrhage   . Gastritis   . Esophagitis, unspecified   . GI bleed 05/16/2015  . Acute GI bleeding   . Symptomatic anemia 04/30/2015  . HTN (hypertension) 04/06/2015  . GERD (gastroesophageal reflux disease) 04/06/2015  . HLD (hyperlipidemia) 04/06/2015  . Dyspnea 04/06/2015  . Cirrhosis of liver with ascites ([place]) 04/06/2015  . Ascites 04/06/2015  . GIB (gastrointestinal bleeding) 03/23/2015  . Homicidal ideation 06/19/2013  . Suicidal intent 06/19/2013  . Homicidal ideations 06/19/2013  . Hyperkalemia 06/16/2013  . Mandible fracture ([place]) 06/05/2013  . Fracture, mandible ([place]) 06/02/2013  .  Coronary atherosclerosis of native coronary artery 06/02/2013  . ESRD on dialysis ([place]) 06/02/2013  . Mandible open fracture ([place]) 06/02/2013    Past Surgical History:  Procedure Laterality Date  . AGILE CAPSULE N/A 06/19/2016   Procedure: AGILE CAPSULE;  Surgeon: [name], MD;  Location: [facility] ENDOSCOPY;  Service: Endoscopy;  Laterality: N/A;  . COLONOSCOPY WITH PROPOFOL N/A 06/18/2016   Procedure: COLONOSCOPY WITH PROPOFOL;  Surgeon: [name], MD;  Location: [facility]  ENDOSCOPY;  Service: Endoscopy;  Laterality: N/A;  . COLONOSCOPY WITH PROPOFOL N/A 08/12/2016   Procedure: COLONOSCOPY WITH PROPOFOL;  Surgeon: [name], MD;  Location: [facility] ENDOSCOPY;  Service: Endoscopy;  Laterality: N/A;  . CORONARY ANGIOPLASTY  ?   PT UNABLE TO TELL IF  BEFORE OR AFTER  CABG  . CORONARY ARTERY BYPASS GRAFT  2008  ([place] , )   3 VESSEL  . DIALYSIS FISTULA CREATION  LAST SURGERY  APPOX  2008  . ENTEROSCOPY N/A 05/10/2016   Procedure: ENTEROSCOPY;  Surgeon: Jerene Bears, MD;  Location: [place];  Service: Gastroenterology;  Laterality: N/A;  . ENTEROSCOPY N/A 08/12/2016   Procedure: ENTEROSCOPY;  Surgeon: [name], MD;  Location: [facility] ENDOSCOPY;  Service: Endoscopy;  Laterality: N/A;  . ESOPHAGOGASTRODUODENOSCOPY N/A 05/07/2015   Procedure: ESOPHAGOGASTRODUODENOSCOPY (EGD);  Surgeon: Hulen Luster, MD;  Location[facility] ENDOSCOPY;  Service: Endoscopy;  Laterality: N/A;  . ESOPHAGOGASTRODUODENOSCOPY (EGD) WITH PROPOFOL N/A 05/17/2015   Procedure: ESOPHAGOGASTRODUODENOSCOPY (EGD) WITH PROPOFOL;  Surgeon: [name], MD;  Location: [facility] ENDOSCOPY;  Service: Endoscopy;  Laterality: N/A;  . ESOPHAGOGASTRODUODENOSCOPY (EGD) WITH PROPOFOL N/A 01/20/2016   Procedure: ESOPHAGOGASTRODUODENOSCOPY (EGD) WITH PROPOFOL;  Surgeon: [name], MD;  Location: [facility] ENDOSCOPY;  Service: Endoscopy;  Laterality: N/A;  . ESOPHAGOGASTRODUODENOSCOPY (EGD) WITH PROPOFOL N/A 04/17/2016   Procedure: ESOPHAGOGASTRODUODENOSCOPY (EGD) WITH PROPOFOL;  Surgeon: Lin Landsman, MD;  Location: [facility] ENDOSCOPY;  Service: Gastroenterology;  Laterality: N/A;  . ESOPHAGOGASTRODUODENOSCOPY (EGD) WITH PROPOFOL  05/09/2016   Procedure: ESOPHAGOGASTRODUODENOSCOPY (EGD) WITH PROPOFOL;  Surgeon: Jerene Bears, MD;  Location: [place];  Service: Endoscopy;;  . ESOPHAGOGASTRODUODENOSCOPY (EGD) WITH PROPOFOL N/A 06/16/2016   Procedure: ESOPHAGOGASTRODUODENOSCOPY (EGD) WITH PROPOFOL;  Surgeon: [name], MD;   Location: [facility] ENDOSCOPY;  Service: Endoscopy;  Laterality: N/A;  . GIVENS CAPSULE STUDY N/A 05/07/2016   Procedure: GIVENS CAPSULE STUDY;  Surgeon: Doran Stabler, MD;  Location: [place];  Service: Endoscopy;  Laterality: N/A;  . MANDIBULAR HARDWARE REMOVAL N/A 07/29/2013   Procedure: REMOVAL OF ARCH BARS;  Surgeon: Theodoro Kos, DO;  Location: [place];  Service: Plastics;  Laterality: N/A;  . ORIF MANDIBULAR FRACTURE N/A 06/05/2013   Procedure: REPAIR OF MANDIBULAR FRACTURE x 2 with maxillo-mandibular fixation ;  Surgeon: Theodoro Kos, DO;  Location: [place];  Service: Plastics;  Laterality: N/A;  . PERIPHERAL ARTERIAL STENT GRAFT Left     Current Outpatient Rx  . Order #: [id number] Class: Print  . Order #: [id number] Class: Print  . Order #: [id number] Class: Print  . Order #: [id number] Class: Print  . Order #: [id number] Class: Historical Med  . Order #: [id number] Class: Historical Med  . Order #: [id number] Class: Historical Med  . Order #: [id number] Class: Print  . Order #: [id number] Class: Historical Med  . Order #: [id number] Class: Print  . Order #: [id number] Class: Historical Med  . Order #: [id number] Class: Normal  . Order #: [id number] Class: Print  . Order #: [id number] Class: Normal  . Order #: [id number] Class: Print  . Order #: [id number] Class: Print  . Order #:  [id number] Class: Print    Allergies Patient has no known allergies.  Family History  Problem Relation Age of Onset  . Colon cancer Mother   . Cancer Father   . Cancer Sister   . Kidney disease Brother     Social History Social History   Tobacco Use  . Smoking status: Current Every Day Smoker    Packs/day: 0.15    Years: 40.00    Pack years: 6.00    Types: Cigarettes  . Smokeless tobacco: Never Used  Substance Use Topics  . Alcohol use: No    Comment: pt reports quitting after learning about cirrhosis  . Drug use: No    Review of Systems Constitutional: Positive subjective fever.  No  body aches. Eyes: No visual changes. ENT: No sore throat. No congestion or rhinorrhea.  No ear pain. Cardiovascular: Denies chest pain. Denies palpitations. Respiratory: Denies shortness of breath.  Positive wheezing.  Positive cough. Gastrointestinal: No abdominal pain.  No nausea, no vomiting.  No diarrhea.  No constipation. Genitourinary: Negative for dysuria. Musculoskeletal: Negative for back pain.  No lower extremity swelling or calf pain. Skin: Negative for rash. Neurological: Negative for headaches. No focal numbness, tingling or weakness.     ____________________________________________   PHYSICAL EXAM:  VITAL SIGNS: ED Triage Vitals  Enc Vitals Group     BP 04/10/17 0941 108/71     Pulse Rate 04/10/17 0941 79     Resp 04/10/17 0941 18     Temp 04/10/17 0941 98.2 F (36.8 C)     Temp Source 04/10/17 0941 Oral     SpO2 04/10/17 0941 100 %     Weight 04/10/17 0942 165 lb (74.8 kg)     Height 04/10/17 0942 6\' 3"  (1.905 m)     Head Circumference --      Peak Flow --      Pain Score 04/10/17 0942 0     Pain Loc --      Pain Edu? --      Excl. in [place]? --     Constitutional: Alert and oriented. Answers questions appropriately.  Chronically ill appearing in no acute distress. Eyes: Conjunctivae are normal.  EOMI. No scleral icterus.  No eye discharge. Head: Atraumatic. Nose: No congestion/rhinnorhea. Mouth/Throat: Mucous membranes are moist.  Neck: No stridor.  Supple.  Positive JVD.  No meningismus. Cardiovascular: Normal rate, regular rhythm. No murmurs, rubs or gallops.  Respiratory: The patient is mildly tachypneic with accessory muscle use but no retractions.  He is able to speak in full sentences without difficulty.  He does have inspiratory and expiratory wheezing without rales or rhonchi. Gastrointestinal: Soft, nontender and mildly distended.  He may have some abdominal edema..  No guarding or rebound.  No peritoneal signs. Musculoskeletal: No LE edema. No ttp  in the calves or palpable cords.  Negative Homan's sign. Neurologic:  A&Ox3.  Speech is clear.  Face and smile are symmetric.  EOMI.  Moves all extremities well. Skin:  Skin is warm, dry and intact. No rash noted. Psychiatric: Affect is flat and mood is depressed.  ____________________________________________   LABS (all labs ordered are listed, but only abnormal results are displayed)  Labs Reviewed  CBC - Abnormal; Notable for the following components:      Result Value   RBC 3.26 (*)    Hemoglobin 8.6 (*)    HCT 28.3 (*)    MCHC 30.5 (*)    RDW 18.5 (*)    All other components  within normal limits  BASIC METABOLIC PANEL  INFLUENZA PANEL BY PCR (TYPE A & B)  TROPONIN I  BRAIN NATRIURETIC PEPTIDE   ____________________________________________  EKG  ED ECG REPORT I, Eula Listen, the attending physician, personally viewed and interpreted this ECG.   Date: 04/10/2017  EKG Time: 1204  Rate: 86  Rhythm: normal sinus rhythm  Axis: normal  Intervals:none  ST&T Change: No STEMI  ____________________________________________  RADIOLOGY  Dg Chest 2 View  Result Date: 04/10/2017 CLINICAL DATA:  CHF.  End-stage renal disease. EXAM: CHEST  2 VIEW COMPARISON:  04/01/2017 FINDINGS: Stable surgical changes from bypass surgery. The heart is borderline enlarged but stable. Stable tortuosity and calcification of the thoracic aorta. Mild vascular congestion without overt pulmonary edema. No pleural effusions. No focal infiltrates. Healing right lower rib fractures are noted. IMPRESSION: Mild cardiac enlargement and vascular congestion without overt pulmonary edema or pleural effusion. Electronically Signed   By: Marijo Sanes M.D.   On: 04/10/2017 11:41    ____________________________________________   PROCEDURES  Procedure(s) performed: None  Procedures  Critical Care performed: No ____________________________________________   INITIAL IMPRESSION / ASSESSMENT AND  PLAN / ED COURSE  Pertinent labs & imaging results that were available during my care of the patient were reviewed by me and considered in my medical decision making (see chart for details).  57 y.o. male with multiple chronic comorbidities including end-stage renal disease on hemodialysis presenting for dialysis, with new cough and cold symptoms.  Overall, the patient is oxygenating well on room air but he does have significant wheezing.  This may be due to fluid overload, but I am also concerned about possible infectious etiology given his recent new cough and subjective fever last night.  We will get a chest x-ray to rule out pneumonia and influenza testing today.  CHF exacerbation is also possible, and I have ordered a BNP and troponin as well.  The patient will get a EKG.  The dialysis center has been called and he will be brought up for dialysis at 1 PM if his ED results are reassuring.  ----------------------------------------- 1:01 PM on 04/10/2017 -----------------------------------------  At this time, the patient's chest x-ray has resulted which shows mild cardiac enlargement and vascular congestion without pulmonary edema or effusion.  His CBC has come back with chronic anemia but otherwise unchanged.  The remainder of his labs are still outstanding.  At this time, the patient refuses to complete his treatment.  He states he has a paracentesis and is leaving from the hospital.  I have had a long discussion with the patient about the risks of leaving, including lifelong morbidity and the risk of death.  He understands this.  I have encouraged him to follow-up with his primary care physician and to return for any symptoms concerning to him.  ____________________________________________  FINAL CLINICAL IMPRESSION(S) / ED DIAGNOSES  Final diagnoses:  Wheezing  Cough  End stage renal disease ([place])  Fever, unspecified fever cause         NEW MEDICATIONS STARTED DURING THIS  VISIT:  New Prescriptions   No medications on file      Eula Listen, MD 04/10/17 1302

## 2017-04-10 NOTE — ED Triage Notes (Signed)
FIRST NURSE NOTE-here for dialysis. Last received Friday.

## 2017-04-12 ENCOUNTER — Emergency Department: Payer: Medicare Other

## 2017-04-12 ENCOUNTER — Ambulatory Visit
Admission: EM | Admit: 2017-04-12 | Discharge: 2017-04-12 | Disposition: A | Discharge status: Home / Self Care | Payer: Medicare Other | Attending: Student in an Organized Health Care Education/Training Program | Admitting: Student in an Organized Health Care Education/Training Program

## 2017-04-12 ENCOUNTER — Encounter: Payer: Self-pay | Admitting: Emergency Medicine

## 2017-04-12 ENCOUNTER — Other Ambulatory Visit: Payer: Self-pay

## 2017-04-12 DIAGNOSIS — I132 Hypertensive heart and chronic kidney disease with heart failure and with stage 5 chronic kidney disease, or end stage renal disease: Secondary | ICD-10-CM | POA: Diagnosis not present

## 2017-04-12 DIAGNOSIS — E875 Hyperkalemia: Secondary | ICD-10-CM | POA: Diagnosis not present

## 2017-04-12 DIAGNOSIS — Z992 Dependence on renal dialysis: Secondary | ICD-10-CM | POA: Diagnosis not present

## 2017-04-12 DIAGNOSIS — E1122 Type 2 diabetes mellitus with diabetic chronic kidney disease: Secondary | ICD-10-CM | POA: Insufficient documentation

## 2017-04-12 DIAGNOSIS — Q8781 Alport syndrome: Secondary | ICD-10-CM | POA: Diagnosis not present

## 2017-04-12 DIAGNOSIS — E1151 Type 2 diabetes mellitus with diabetic peripheral angiopathy without gangrene: Secondary | ICD-10-CM | POA: Insufficient documentation

## 2017-04-12 DIAGNOSIS — F17210 Nicotine dependence, cigarettes, uncomplicated: Secondary | ICD-10-CM | POA: Diagnosis not present

## 2017-04-12 DIAGNOSIS — I509 Heart failure, unspecified: Secondary | ICD-10-CM | POA: Insufficient documentation

## 2017-04-12 DIAGNOSIS — E1142 Type 2 diabetes mellitus with diabetic polyneuropathy: Secondary | ICD-10-CM | POA: Diagnosis not present

## 2017-04-12 DIAGNOSIS — K219 Gastro-esophageal reflux disease without esophagitis: Secondary | ICD-10-CM | POA: Diagnosis not present

## 2017-04-12 DIAGNOSIS — N2581 Secondary hyperparathyroidism of renal origin: Secondary | ICD-10-CM | POA: Insufficient documentation

## 2017-04-12 DIAGNOSIS — E785 Hyperlipidemia, unspecified: Secondary | ICD-10-CM | POA: Diagnosis not present

## 2017-04-12 DIAGNOSIS — K7460 Unspecified cirrhosis of liver: Secondary | ICD-10-CM | POA: Insufficient documentation

## 2017-04-12 DIAGNOSIS — R9431 Abnormal electrocardiogram [ECG] [EKG]: Secondary | ICD-10-CM | POA: Insufficient documentation

## 2017-04-12 DIAGNOSIS — Z951 Presence of aortocoronary bypass graft: Secondary | ICD-10-CM | POA: Insufficient documentation

## 2017-04-12 DIAGNOSIS — I120 Hypertensive chronic kidney disease with stage 5 chronic kidney disease or end stage renal disease: Secondary | ICD-10-CM | POA: Diagnosis not present

## 2017-04-12 DIAGNOSIS — I2510 Atherosclerotic heart disease of native coronary artery without angina pectoris: Secondary | ICD-10-CM | POA: Diagnosis not present

## 2017-04-12 DIAGNOSIS — R188 Other ascites: Secondary | ICD-10-CM | POA: Diagnosis not present

## 2017-04-12 DIAGNOSIS — J439 Emphysema, unspecified: Secondary | ICD-10-CM | POA: Insufficient documentation

## 2017-04-12 DIAGNOSIS — D631 Anemia in chronic kidney disease: Secondary | ICD-10-CM | POA: Insufficient documentation

## 2017-04-12 DIAGNOSIS — N186 End stage renal disease: Secondary | ICD-10-CM | POA: Diagnosis not present

## 2017-04-12 DIAGNOSIS — R0602 Shortness of breath: Secondary | ICD-10-CM | POA: Diagnosis not present

## 2017-04-12 LAB — COMPREHENSIVE METABOLIC PANEL
ALK PHOS: 302 U/L — AB (ref 38–126)
ALT: 65 U/L — AB (ref 17–63)
ANION GAP: 13 (ref 5–15)
AST: 60 U/L — ABNORMAL HIGH (ref 15–41)
Albumin: 3.1 g/dL — ABNORMAL LOW (ref 3.5–5.0)
BUN: 68 mg/dL — ABNORMAL HIGH (ref 6–20)
CALCIUM: 8.5 mg/dL — AB (ref 8.9–10.3)
CO2: 22 mmol/L (ref 22–32)
CREATININE: 9.17 mg/dL — AB (ref 0.61–1.24)
Chloride: 100 mmol/L — ABNORMAL LOW (ref 101–111)
GFR, EST AFRICAN AMERICAN: 6 mL/min — AB (ref >60)
GFR, EST NON AFRICAN AMERICAN: 6 mL/min — AB (ref >60)
Glucose, Bld: 90 mg/dL (ref 65–99)
Potassium: 7.5 mmol/L (ref 3.5–5.1)
Sodium: 135 mmol/L (ref 135–145)
TOTAL PROTEIN: 6.8 g/dL (ref 6.5–8.1)
Total Bilirubin: 0.5 mg/dL (ref 0.3–1.2)

## 2017-04-12 LAB — CBC WITH DIFFERENTIAL/PLATELET
Basophils Absolute: 0.1 10*3/uL (ref 0–0.1)
Basophils Relative: 1 %
EOS ABS: 0.1 10*3/uL (ref 0–0.7)
EOS PCT: 1 %
HCT: 28.5 % — ABNORMAL LOW (ref 40.0–52.0)
HEMOGLOBIN: 8.8 g/dL — AB (ref 13.0–18.0)
LYMPHS PCT: 7 %
Lymphs Abs: 0.4 10*3/uL — ABNORMAL LOW (ref 1.0–3.6)
MCH: 27.1 pg (ref 26.0–34.0)
MCHC: 31 g/dL — AB (ref 32.0–36.0)
MCV: 87.4 fL (ref 80.0–100.0)
MONOS PCT: 12 %
Monocytes Absolute: 0.8 10*3/uL (ref 0.2–1.0)
NEUTROS PCT: 79 %
Neutro Abs: 5.2 10*3/uL (ref 1.4–6.5)
Platelets: 260 10*3/uL (ref 150–440)
RBC: 3.26 MIL/uL — ABNORMAL LOW (ref 4.40–5.90)
RDW: 18.8 % — ABNORMAL HIGH (ref 11.5–14.5)
WBC: 6.6 10*3/uL (ref 3.8–10.6)

## 2017-04-12 LAB — TROPONIN I

## 2017-04-12 MED ORDER — INSULIN ASPART 100 UNIT/ML ~~LOC~~ SOLN
10.0000 [IU] | Freq: Once | SUBCUTANEOUS | Status: AC
  Administered 2017-04-12: 10 [IU] via INTRAVENOUS
  Filled 2017-04-12: qty 1

## 2017-04-12 MED ORDER — SODIUM BICARBONATE 8.4 % IV SOLN
50.0000 meq | Freq: Once | INTRAVENOUS | Status: AC
  Administered 2017-04-12: 50 meq via INTRAVENOUS
  Filled 2017-04-12: qty 50

## 2017-04-12 MED ORDER — CALCIUM GLUCONATE 10 % IV SOLN
1.0000 g | Freq: Once | INTRAVENOUS | Status: AC
  Administered 2017-04-12: 1 g via INTRAVENOUS

## 2017-04-12 MED ORDER — SODIUM CHLORIDE 0.9 % IV SOLN
1.0000 g | Freq: Once | INTRAVENOUS | Status: DC
  Filled 2017-04-12: qty 10

## 2017-04-12 MED ORDER — CALCIUM GLUCONATE 10 % IV SOLN
INTRAVENOUS | Status: AC
  Administered 2017-04-12: 1 g via INTRAVENOUS
  Filled 2017-04-12: qty 10

## 2017-04-12 MED ORDER — DEXTROSE 50 % IV SOLN
25.0000 g | Freq: Once | INTRAVENOUS | Status: AC
  Administered 2017-04-12: 25 g via INTRAVENOUS
  Filled 2017-04-12: qty 50

## 2017-04-12 NOTE — Progress Notes (Signed)
Post HD  

## 2017-04-12 NOTE — Progress Notes (Signed)
[facility], [place] 04/12/17  Subjective:   Seen and examined during hemodialysis.   Objective:  Vital signs in last 24 hours:  Temp:  [98.3 F (36.8 C)] 98.3 F (36.8 C) (01/30 1051) Pulse Rate:  [79-89] 86 (01/30 1215) Resp:  [16-24] 20 (01/30 1230) BP: (120-139)/(83-85) 120/83 (01/30 1230) SpO2:  [97 %-100 %] 100 % (01/30 1215) Weight:  [74.8 kg (165 lb)] 74.8 kg (165 lb) (01/30 1052)  Weight change:  Filed Weights   04/12/17 1052  Weight: 74.8 kg (165 lb)    Intake/Output:   No intake or output data in the 24 hours ending 04/12/17 1454   Physical Exam: General: NAD sitting in chair  HEENT Moist oral mucus membranes  Neck supple  Pulm/lungs Clear bilateral  CVS/Heart Regular no rubs  Abdomen:  Soft  Extremities: No  edema  Neurologic: Alert, oriented x 3  Skin: No acute rashes  Access: Arm AVF       Basic Metabolic Panel:  Recent Labs  Lab 04/10/17 1157 04/12/17 1050  NA 138 135  K 7.3* 7.5*  CL 102 100*  CO2 23 22  GLUCOSE 104* 90  BUN 78* 68*  CREATININE 10.22* 9.17*  CALCIUM 8.7* 8.5*     CBC: Recent Labs  Lab 04/10/17 1157 04/12/17 1050  WBC 5.5 6.6  NEUTROABS  --  5.2  HGB 8.6* 8.8*  HCT 28.3* 28.5*  MCV 86.7 87.4  PLT 281 260      Lab Results  Component Value Date   HEPBSAG Negative 03/01/2017   HEPBSAB Reactive 03/01/2017   HEPBIGM Negative 06/01/2016      Microbiology:  No results found for this or any previous visit (from the past 240 hour(s)).  Coagulation Studies: No results for input(s): LABPROT, INR in the last 72 hours.  Urinalysis: No results for input(s): COLORURINE, LABSPEC, PHURINE, GLUCOSEU, HGBUR, BILIRUBINUR, KETONESUR, PROTEINUR, UROBILINOGEN, NITRITE, LEUKOCYTESUR in the last 72 hours.  Invalid input(s): APPERANCEUR    Imaging: Dg Chest 1 View  Result Date: 04/12/2017 CLINICAL DATA:  Shortness of breath today.  Dialysis patient. EXAM: CHEST 1 VIEW COMPARISON:   Single-view of the chest 04/01/2017. PA and lateral chest 04/10/2016. FINDINGS: There is cardiomegaly and vascular congestion. The lungs are emphysematous. No consolidative process, pneumothorax or pleural fluid. The patient is status post CABG. Remote lower right rib fractures are noted. IMPRESSION: No acute disease. Cardiomegaly and emphysema. Electronically Signed   By: Inge Rise M.D.   On: 04/12/2017 11:29     Medications:       Assessment/ Plan:  57 y.o. male with end stage renal disease on hemodialysis secondary to Alport's syndrome, ascites, hypertension, anemia of chronic kidney disease, coronary artery disease, peripheral vascular disease, hyperlipidemia, gastrointestinal AVMs, congestive heart failure  [facility] TTS AVF- no outpatient dialysis unit currently  1. Hyperkalemia: 7.5 2. Anemia of chronic kidney disease 3. Secondary Hyperparathyroidism 4. Ascites 5. Hypertension 6. ESRD  Plan:  Seen on hemodialysis. Tolerating treatment well.    LOS: 0 Lavonia Dana 1/30/20192:54 PM  [place], [place]

## 2017-04-12 NOTE — ED Notes (Signed)
Pt transferred to dialysis.

## 2017-04-12 NOTE — ED Notes (Addendum)
dialysis called and the nurse, Hughie Closs, stated to bring the pt in 20 min

## 2017-04-12 NOTE — ED Provider Notes (Signed)
[facility] Emergency Department Provider Note       Time seen: ----------------------------------------- 10:58 AM on 04/12/2017 -----------------------------------------   I have reviewed the triage vital signs and the nursing notes.  HISTORY   Chief Complaint Shortness of Breath    HPI Stanley [name] is a 57 y.o. male with a history of alcohol abuse, CHF, cirrhosis, end-stage renal disease on dialysis who presents to the ED for shortness of breath.  Patient states he is a dialysis patient and unfortunately he cannot receive outpatient dialysis due to behavioral issues so he presents to the ER for dialysis today.  Patient states he did not go to dialysis today because he was feeling too weak.  EMS reported he was wheezing, was given a DuoNeb which improved his oxygen saturation from 96% to 100%.  He states recently he received a medication to make his muscle stronger.  Past Medical History:  Diagnosis Date  . Alcohol abuse   . CHF (congestive heart failure) ([place])   . Cirrhosis ([place])   . Coronary artery disease 2009  . Diabetic peripheral neuropathy ([place])   . Drug abuse ([place])   . End stage renal disease on dialysis [facility]) NEPHROLOGIST-   DR [facility]  IN [place]   HEMODIALYSIS --   TUES/  THURS/  SAT  . GERD (gastroesophageal reflux disease)   . Hyperlipidemia   . Hypertension   . PAD (peripheral artery disease) ([place])   . Renal insufficiency    Per pt, 32 oz fluid restriction per day  . S/P triple vessel bypass 06/09/2016   2009ish  . Suicidal ideation    & HOMICIDAL IDEATION --  06-16-2013   ADMITTED TO BEHAVIOR HEALTH    Patient Active Problem List   Diagnosis Date Noted  . Uremia 03/08/2017  . Hemodialysis patient ([place]) 03/03/2017  . Weakness 02/28/2017  . Hypocalcemia 02/22/2017  . Shortness of breath 11/26/2016  . COPD (chronic obstructive pulmonary disease) ([place]) 10/30/2016  . COPD exacerbation ([place]) 10/29/2016  . Anemia   . Heme  positive stool   . Ulceration of intestine   . Benign neoplasm of transverse colon   . Acute gastrointestinal hemorrhage   . Esophageal candidiasis ([place])   . Angiodysplasia of intestinal tract   . Acute respiratory failure with hypoxia ([place]) 07/03/2016  . GI bleeding 06/24/2016  . Rectal bleeding 06/14/2016  . Anemia of chronic disease 06/01/2016  . MRSA carrier 06/01/2016  . Chronic renal failure 05/23/2016  . Ischemic heart disease 05/23/2016  . Angiodysplasia of small intestine   . Melena   . Small bowel bleed not requiring more than 4 units of blood in 24 hours, ICU, or surgery   . Anemia due to chronic blood loss   . Abdominal pain 05/05/2016  . Acute posthemorrhagic anemia 04/17/2016  . Gastrointestinal bleed 04/17/2016  . History of esophagogastroduodenoscopy (EGD) 04/17/2016  . Elevated troponin 04/17/2016  . Alcohol abuse 04/17/2016  . Upper GI bleed 01/19/2016  . Blood in stool   . Angiodysplasia of stomach and duodenum with hemorrhage   . Gastritis   . Esophagitis, unspecified   . GI bleed 05/16/2015  . Acute GI bleeding   . Symptomatic anemia 04/30/2015  . HTN (hypertension) 04/06/2015  . GERD (gastroesophageal reflux disease) 04/06/2015  . HLD (hyperlipidemia) 04/06/2015  . Dyspnea 04/06/2015  . Cirrhosis of liver with ascites ([place]) 04/06/2015  . Ascites 04/06/2015  . GIB (gastrointestinal bleeding) 03/23/2015  . Homicidal ideation 06/19/2013  . Suicidal intent  06/19/2013  . Homicidal ideations 06/19/2013  . Hyperkalemia 06/16/2013  . Mandible fracture ([place]) 06/05/2013  . Fracture, mandible ([place]) 06/02/2013  . Coronary atherosclerosis of native coronary artery 06/02/2013  . ESRD on dialysis ([place]) 06/02/2013  . Mandible open fracture ([place]) 06/02/2013    Past Surgical History:  Procedure Laterality Date  . AGILE CAPSULE N/A 06/19/2016   Procedure: AGILE CAPSULE;  Surgeon: [name], MD;  Location: [facility] ENDOSCOPY;  Service: Endoscopy;  Laterality: N/A;  .  COLONOSCOPY WITH PROPOFOL N/A 06/18/2016   Procedure: COLONOSCOPY WITH PROPOFOL;  Surgeon: [name], MD;  Location: [facility] ENDOSCOPY;  Service: Endoscopy;  Laterality: N/A;  . COLONOSCOPY WITH PROPOFOL N/A 08/12/2016   Procedure: COLONOSCOPY WITH PROPOFOL;  Surgeon: [name], MD;  Location: [facility] ENDOSCOPY;  Service: Endoscopy;  Laterality: N/A;  . CORONARY ANGIOPLASTY  ?   PT UNABLE TO TELL IF  BEFORE OR AFTER  CABG  . CORONARY ARTERY BYPASS GRAFT  2008  ([place] , [place])   3 VESSEL  . DIALYSIS FISTULA CREATION  LAST SURGERY  APPOX  2008  . ENTEROSCOPY N/A 05/10/2016   Procedure: ENTEROSCOPY;  Surgeon: Jerene Bears, MD;  Location: [place];  Service: Gastroenterology;  Laterality: N/A;  . ENTEROSCOPY N/A 08/12/2016   Procedure: ENTEROSCOPY;  Surgeon: [name], MD;  Location: [facility] ENDOSCOPY;  Service: Endoscopy;  Laterality: N/A;  . ESOPHAGOGASTRODUODENOSCOPY N/A 05/07/2015   Procedure: ESOPHAGOGASTRODUODENOSCOPY (EGD);  Surgeon: Hulen Luster, MD;  Location: [facility] ENDOSCOPY;  Service: Endoscopy;  Laterality: N/A;  . ESOPHAGOGASTRODUODENOSCOPY (EGD) WITH PROPOFOL N/A 05/17/2015   Procedure: ESOPHAGOGASTRODUODENOSCOPY (EGD) WITH PROPOFOL;  Surgeon: [name], MD;  Location: [facility] ENDOSCOPY;  Service: Endoscopy;  Laterality: N/A;  . ESOPHAGOGASTRODUODENOSCOPY (EGD) WITH PROPOFOL N/A 01/20/2016   Procedure: ESOPHAGOGASTRODUODENOSCOPY (EGD) WITH PROPOFOL;  Surgeon: [name], MD;  Location: [facility] ENDOSCOPY;  Service: Endoscopy;  Laterality: N/A;  . ESOPHAGOGASTRODUODENOSCOPY (EGD) WITH PROPOFOL N/A 04/17/2016   Procedure: ESOPHAGOGASTRODUODENOSCOPY (EGD) WITH PROPOFOL;  Surgeon: Lin Landsman, MD;  Location: [facility] ENDOSCOPY;  Service: Gastroenterology;  Laterality: N/A;  . ESOPHAGOGASTRODUODENOSCOPY (EGD) WITH PROPOFOL  05/09/2016   Procedure: ESOPHAGOGASTRODUODENOSCOPY (EGD) WITH PROPOFOL;  Surgeon: Jerene Bears, MD;  Location: [place];  Service: Endoscopy;;  . ESOPHAGOGASTRODUODENOSCOPY (EGD)  WITH PROPOFOL N/A 06/16/2016   Procedure: ESOPHAGOGASTRODUODENOSCOPY (EGD) WITH PROPOFOL;  Surgeon: [name], MD;  Location: [facility] ENDOSCOPY;  Service: Endoscopy;  Laterality: N/A;  . GIVENS CAPSULE STUDY N/A 05/07/2016   Procedure: GIVENS CAPSULE STUDY;  Surgeon: Doran Stabler, MD;  Location: [place];  Service: Endoscopy;  Laterality: N/A;  . MANDIBULAR HARDWARE REMOVAL N/A 07/29/2013   Procedure: REMOVAL OF ARCH BARS;  Surgeon: Theodoro Kos, DO;  Location: [place];  Service: Plastics;  Laterality: N/A;  . ORIF MANDIBULAR FRACTURE N/A 06/05/2013   Procedure: REPAIR OF MANDIBULAR FRACTURE x 2 with maxillo-mandibular fixation ;  Surgeon: Theodoro Kos, DO;  Location: [place];  Service: Plastics;  Laterality: N/A;  . PERIPHERAL ARTERIAL STENT GRAFT Left     Allergies Patient has no known allergies.  Social History Social History   Tobacco Use  . Smoking status: Current Every Day Smoker    Packs/day: 0.15    Years: 40.00    Pack years: 6.00    Types: Cigarettes  . Smokeless tobacco: Never Used  Substance Use Topics  . Alcohol use: No    Comment: pt reports quitting after learning about cirrhosis  . Drug use: No    Review of Systems Constitutional: Negative  for fever. Cardiovascular: Negative for chest pain. Respiratory: Positive shortness of breath Gastrointestinal: Negative for abdominal pain, vomiting and diarrhea. Genitourinary: Negative for dysuria. Musculoskeletal: Negative for back pain. Skin: Negative for rash. Neurological: Negative for headaches, positive for generalized weakness  All systems negative/normal/unremarkable except as stated in the HPI  ____________________________________________   PHYSICAL EXAM:  VITAL SIGNS: ED Triage Vitals  Enc Vitals Group     BP 04/12/17 1051 136/83     Pulse Rate 04/12/17 1051 80     Resp 04/12/17 1051 (!) 22     Temp 04/12/17 1051 98.3 F (36.8 C)     Temp Source 04/12/17 1051 Oral     SpO2  04/12/17 1051 100 %     Weight 04/12/17 1052 165 lb (74.8 kg)     Height 04/12/17 1052 6\' 3"  (1.905 m)     Head Circumference --      Peak Flow --      Pain Score 04/12/17 1050 8     Pain Loc --      Pain Edu? --      Excl. in [place]? --     Constitutional: Alert and oriented. Well appearing and in no distress. Eyes: Conjunctivae are normal. Normal extraocular movements. ENT   Head: Normocephalic and atraumatic.   Nose: No congestion/rhinnorhea.   Mouth/Throat: Mucous membranes are moist.   Neck: No stridor. Cardiovascular: Normal rate, regular rhythm.  Murmur is auscultated Respiratory: Normal respiratory effort without tachypnea nor retractions. Breath sounds are clear and equal bilaterally. No wheezes/rales/rhonchi. Gastrointestinal: Soft and nontender. Normal bowel sounds Musculoskeletal: Nontender with normal range of motion in extremities. No lower extremity tenderness nor edema. Neurologic:  Normal speech and language. No gross focal neurologic deficits are appreciated.  Skin:  Skin is warm, dry and intact. No rash noted. Psychiatric: Mood and affect are normal. Speech and behavior are normal.  ____________________________________________  EKG: Interpreted by me.  Sinus rhythm rate 79 bpm, prolonged PR interval, wide QRS, normal QT.  ____________________________________________  ED COURSE:  As part of my medical decision making, I reviewed the following data within the [place] History obtained from family if available, nursing notes, old chart and ekg, as well as notes from prior ED visits. Patient presented for shortness of breath, we will assess with labs and imaging as indicated at this time.   Procedures ____________________________________________   LABS (pertinent positives/negatives)  Labs Reviewed  CBC WITH DIFFERENTIAL/PLATELET - Abnormal; Notable for the following components:      Result Value   RBC 3.26 (*)    Hemoglobin 8.8  (*)    HCT 28.5 (*)    MCHC 31.0 (*)    RDW 18.8 (*)    Lymphs Abs 0.4 (*)    All other components within normal limits  COMPREHENSIVE METABOLIC PANEL - Abnormal; Notable for the following components:   Potassium 7.5 (*)    Chloride 100 (*)    BUN 68 (*)    Creatinine, Ser 9.17 (*)    Calcium 8.5 (*)    Albumin 3.1 (*)    AST 60 (*)    ALT 65 (*)    Alkaline Phosphatase 302 (*)    GFR calc non Af Amer 6 (*)    GFR calc Af Amer 6 (*)    All other components within normal limits  TROPONIN I   CRITICAL CARE Performed by: Earleen Newport   Total critical care time: 30 minutes  Critical care time was  exclusive of separately billable procedures and treating other patients.  Critical care was necessary to treat or prevent imminent or life-threatening deterioration.  Critical care was time spent personally by me on the following activities: development of treatment plan with patient and/or surrogate as well as nursing, discussions with consultants, evaluation of patient's response to treatment, examination of patient, obtaining history from patient or surrogate, ordering and performing treatments and interventions, ordering and review of laboratory studies, ordering and review of radiographic studies, pulse oximetry and re-evaluation of patient's condition.  RADIOLOGY Images were viewed by me  Chest x-ray IMPRESSION: No acute disease.  Cardiomegaly and emphysema. ____________________________________________  DIFFERENTIAL DIAGNOSIS   End-stage renal disease, electrolyte abnormality, dehydration, volume overload, COPD, pneumonia  FINAL ASSESSMENT AND PLAN  End-stage renal disease on dialysis, weakness, hyperkalemia   Plan: Patient had presented for generalized weakness.  He was markedly hyperkalemic.  We have given him insulin, D50, sodium bicarb and calcium IV.  I have discussed with nephrology who will take him for urgent dialysis.  He perhaps has mild EKG changes  suggesting hyperkalemia but no severe changes.   Earleen Newport, MD   Note: This note was generated in part or whole with voice recognition software. Voice recognition is usually quite accurate but there are transcription errors that can and very often do occur. I apologize for any typographical errors that were not detected and corrected.     Earleen Newport, MD 04/12/17 [id number]

## 2017-04-12 NOTE — ED Triage Notes (Signed)
Pt presents to the ED with complaints of Shortness of Breath. He is a dialysis pt he states that he didn't go today because he was weak. EMS reports that he had mild wheezing, he was 96% on room air. They gave him a duoneb and he went up to 100%. Pt is able to speak in complete sentences.

## 2017-04-12 NOTE — Progress Notes (Signed)
HD TX started  

## 2017-04-12 NOTE — ED Provider Notes (Signed)
Patient presented to the ER today for his dialysis.  Patient completed dialysis with no complication.  Patient stable and appropriate for discharge home.   Merlyn Lot, MD 04/12/17 [phone]

## 2017-04-12 NOTE — Progress Notes (Signed)
PRE HD   

## 2017-04-12 NOTE — Progress Notes (Signed)
HD TX ended  

## 2017-04-12 NOTE — Progress Notes (Signed)
PRE HD assessment 

## 2017-04-14 ENCOUNTER — Other Ambulatory Visit: Payer: Self-pay

## 2017-04-14 ENCOUNTER — Ambulatory Visit
Admission: EM | Admit: 2017-04-14 | Discharge: 2017-04-14 | Discharge status: Against Medical Advice | Payer: Medicare Other | Attending: Emergency Medicine | Admitting: Emergency Medicine

## 2017-04-14 ENCOUNTER — Encounter: Payer: Self-pay | Admitting: Emergency Medicine

## 2017-04-14 DIAGNOSIS — Z5321 Procedure and treatment not carried out due to patient leaving prior to being seen by health care provider: Secondary | ICD-10-CM | POA: Insufficient documentation

## 2017-04-14 DIAGNOSIS — I129 Hypertensive chronic kidney disease with stage 1 through stage 4 chronic kidney disease, or unspecified chronic kidney disease: Secondary | ICD-10-CM | POA: Diagnosis not present

## 2017-04-14 DIAGNOSIS — N186 End stage renal disease: Secondary | ICD-10-CM | POA: Insufficient documentation

## 2017-04-14 DIAGNOSIS — I509 Heart failure, unspecified: Secondary | ICD-10-CM | POA: Diagnosis not present

## 2017-04-14 DIAGNOSIS — Z22322 Carrier or suspected carrier of Methicillin resistant Staphylococcus aureus: Secondary | ICD-10-CM | POA: Insufficient documentation

## 2017-04-14 DIAGNOSIS — K7460 Unspecified cirrhosis of liver: Secondary | ICD-10-CM | POA: Diagnosis not present

## 2017-04-14 DIAGNOSIS — F17210 Nicotine dependence, cigarettes, uncomplicated: Secondary | ICD-10-CM | POA: Diagnosis not present

## 2017-04-14 DIAGNOSIS — J449 Chronic obstructive pulmonary disease, unspecified: Secondary | ICD-10-CM | POA: Diagnosis not present

## 2017-04-14 DIAGNOSIS — E1122 Type 2 diabetes mellitus with diabetic chronic kidney disease: Secondary | ICD-10-CM | POA: Insufficient documentation

## 2017-04-14 DIAGNOSIS — Z992 Dependence on renal dialysis: Secondary | ICD-10-CM | POA: Insufficient documentation

## 2017-04-14 DIAGNOSIS — N189 Chronic kidney disease, unspecified: Secondary | ICD-10-CM | POA: Diagnosis not present

## 2017-04-14 DIAGNOSIS — E1151 Type 2 diabetes mellitus with diabetic peripheral angiopathy without gangrene: Secondary | ICD-10-CM | POA: Diagnosis not present

## 2017-04-14 DIAGNOSIS — E875 Hyperkalemia: Secondary | ICD-10-CM | POA: Diagnosis not present

## 2017-04-14 DIAGNOSIS — E1142 Type 2 diabetes mellitus with diabetic polyneuropathy: Secondary | ICD-10-CM | POA: Insufficient documentation

## 2017-04-14 DIAGNOSIS — I120 Hypertensive chronic kidney disease with stage 5 chronic kidney disease or end stage renal disease: Secondary | ICD-10-CM | POA: Diagnosis present

## 2017-04-14 DIAGNOSIS — Z79899 Other long term (current) drug therapy: Secondary | ICD-10-CM | POA: Insufficient documentation

## 2017-04-14 DIAGNOSIS — I132 Hypertensive heart and chronic kidney disease with heart failure and with stage 5 chronic kidney disease, or end stage renal disease: Secondary | ICD-10-CM | POA: Insufficient documentation

## 2017-04-14 DIAGNOSIS — N2581 Secondary hyperparathyroidism of renal origin: Secondary | ICD-10-CM | POA: Diagnosis not present

## 2017-04-14 DIAGNOSIS — D631 Anemia in chronic kidney disease: Secondary | ICD-10-CM | POA: Diagnosis not present

## 2017-04-14 NOTE — ED Notes (Signed)
Pt taken to dialysis in a wheelchair. Pt very demanding and cussing at staff.

## 2017-04-14 NOTE — Progress Notes (Signed)
[place], [place] 04/14/17  Subjective:   Seen and examined during hemodialysis.   Patient very upset and verbally abuse during treatment.  Objective:  Vital signs in last 24 hours:  Temp:  [97.9 F (36.6 C)-98.1 F (36.7 C)] (P) 98.1 F (36.7 C) (02/01 1245) Pulse Rate:  [74-89] 89 (02/01 1300) Resp:  [2-22] 22 (02/01 1300) BP: (105-141)/(58-85) 118/58 (02/01 1300) SpO2:  [95 %-99 %] 96 % (02/01 1300) Weight:  [74.8 kg (165 lb)] 74.8 kg (165 lb) (02/01 0841)  Weight change:  Filed Weights   04/14/17 0841  Weight: 74.8 kg (165 lb)    Intake/Output:    Intake/Output Summary (Last 24 hours) at 04/14/2017 1439 Last data filed at 04/14/2017 1300 Gross per 24 hour  Intake -  Output 2611 ml  Net -2611 ml     Physical Exam: General:  sitting in chair  HEENT Moist oral mucus membranes  Neck supple  Pulm/lungs Clear bilateral  CVS/Heart Regular no rubs  Abdomen:  Soft  Extremities: No  edema  Neurologic: Alert, oriented x 3  Skin: No acute rashes  Access: Arm AVF       Basic Metabolic Panel:  Recent Labs  Lab 04/10/17 1157 04/12/17 1050  NA 138 135  K 7.3* 7.5*  CL 102 100*  CO2 23 22  GLUCOSE 104* 90  BUN 78* 68*  CREATININE 10.22* 9.17*  CALCIUM 8.7* 8.5*     CBC: Recent Labs  Lab 04/10/17 1157 04/12/17 1050  WBC 5.5 6.6  NEUTROABS  --  5.2  HGB 8.6* 8.8*  HCT 28.3* 28.5*  MCV 86.7 87.4  PLT 281 260      Lab Results  Component Value Date   HEPBSAG Negative 03/01/2017   HEPBSAB Reactive 03/01/2017   HEPBIGM Negative 06/01/2016      Microbiology:  No results found for this or any previous visit (from the past 240 hour(s)).  Coagulation Studies: No results for input(s): LABPROT, INR in the last 72 hours.  Urinalysis: No results for input(s): COLORURINE, LABSPEC, PHURINE, GLUCOSEU, HGBUR, BILIRUBINUR, KETONESUR, PROTEINUR, UROBILINOGEN, NITRITE, LEUKOCYTESUR in the last 72 hours.  Invalid input(s):  APPERANCEUR    Imaging: No results found.   Medications:       Assessment/ Plan:  57 y.o. male with end stage renal disease on hemodialysis secondary to [place]'s syndrome, ascites, hypertension, anemia of chronic kidney disease, coronary artery disease, peripheral vascular disease, hyperlipidemia, gastrointestinal AVMs, congestive heart failure  [facility] TTS AVF- no outpatient dialysis unit currently  1. Hyperkalemia:  2. Anemia of chronic kidney disease 3. Secondary Hyperparathyroidism 4. Ascites 5. Hypertension 6. ESRD  Plan:  Seen on hemodialysis. Tolerating treatment well.  Patient asked not to be disruptive to dialysis treatment.    LOS: 0 Lavonia Dana 2/1/20192:39 PM  [place], [place]

## 2017-04-14 NOTE — ED Provider Notes (Signed)
[facility] Emergency Department Provider Note  ____________________________________________   First MD Initiated Contact with Patient 04/14/17 [phone]     (approximate)  I have reviewed the triage vital signs and the nursing notes.   HISTORY  Chief Complaint Needs Dialysis   HPI Stanley [name] is a 57 y.o. male end-stage renal disease on dialysis as well as cirrhosis who is presenting to the emergency department today for his 3 times a week dialysis schedule appointment.  He does not have any complaints at this time.  No shortness of breath or pain.  Past Medical History:  Diagnosis Date  . Alcohol abuse   . CHF (congestive heart failure) ([place])   . Cirrhosis ([place])   . Coronary artery disease 2009  . Diabetic peripheral neuropathy ([place])   . Drug abuse ([place])   . End stage renal disease on dialysis [facility]) NEPHROLOGIST-   DR [facility]  IN [place]   HEMODIALYSIS --   TUES/  THURS/  SAT  . GERD (gastroesophageal reflux disease)   . Hyperlipidemia   . Hypertension   . PAD (peripheral artery disease) ([place])   . Renal insufficiency    Per pt, 32 oz fluid restriction per day  . S/P triple vessel bypass 06/09/2016   2009ish  . Suicidal ideation    & HOMICIDAL IDEATION --  06-16-2013   ADMITTED TO BEHAVIOR HEALTH    Patient Active Problem List   Diagnosis Date Noted  . Uremia 03/08/2017  . Hemodialysis patient ([place]) 03/03/2017  . Weakness 02/28/2017  . Hypocalcemia 02/22/2017  . Shortness of breath 11/26/2016  . COPD (chronic obstructive pulmonary disease) ([place]) 10/30/2016  . COPD exacerbation ([place]) 10/29/2016  . Anemia   . Heme positive stool   . Ulceration of intestine   . Benign neoplasm of transverse colon   . Acute gastrointestinal hemorrhage   . Esophageal candidiasis ([place])   . Angiodysplasia of intestinal tract   . Acute respiratory failure with hypoxia ([place]) 07/03/2016  . GI bleeding 06/24/2016  . Rectal bleeding 06/14/2016  . Anemia of  chronic disease 06/01/2016  . MRSA carrier 06/01/2016  . Chronic renal failure 05/23/2016  . Ischemic heart disease 05/23/2016  . Angiodysplasia of small intestine   . Melena   . Small bowel bleed not requiring more than 4 units of blood in 24 hours, ICU, or surgery   . Anemia due to chronic blood loss   . Abdominal pain 05/05/2016  . Acute posthemorrhagic anemia 04/17/2016  . Gastrointestinal bleed 04/17/2016  . History of esophagogastroduodenoscopy (EGD) 04/17/2016  . Elevated troponin 04/17/2016  . Alcohol abuse 04/17/2016  . Upper GI bleed 01/19/2016  . Blood in stool   . Angiodysplasia of stomach and duodenum with hemorrhage   . Gastritis   . Esophagitis, unspecified   . GI bleed 05/16/2015  . Acute GI bleeding   . Symptomatic anemia 04/30/2015  . HTN (hypertension) 04/06/2015  . GERD (gastroesophageal reflux disease) 04/06/2015  . HLD (hyperlipidemia) 04/06/2015  . Dyspnea 04/06/2015  . Cirrhosis of liver with ascites ([place]) 04/06/2015  . Ascites 04/06/2015  . GIB (gastrointestinal bleeding) 03/23/2015  . Homicidal ideation 06/19/2013  . Suicidal intent 06/19/2013  . Homicidal ideations 06/19/2013  . Hyperkalemia 06/16/2013  . Mandible fracture ([place]) 06/05/2013  . Fracture, mandible ([place]) 06/02/2013  . Coronary atherosclerosis of native coronary artery 06/02/2013  . ESRD on dialysis ([place]) 06/02/2013  . Mandible open fracture ([place]) 06/02/2013    Past Surgical History:  Procedure Laterality  Date  . Sonda Rumble CAPSULE N/A 06/19/2016   Procedure: AGILE CAPSULE;  Surgeon: [name], MD;  Location: [facility] ENDOSCOPY;  Service: Endoscopy;  Laterality: N/A;  . COLONOSCOPY WITH PROPOFOL N/A 06/18/2016   Procedure: COLONOSCOPY WITH PROPOFOL;  Surgeon: [name], MD;  Location: [facility] ENDOSCOPY;  Service: Endoscopy;  Laterality: N/A;  . COLONOSCOPY WITH PROPOFOL N/A 08/12/2016   Procedure: COLONOSCOPY WITH PROPOFOL;  Surgeon: [name], MD;  Location: [facility] ENDOSCOPY;  Service:  Endoscopy;  Laterality: N/A;  . CORONARY ANGIOPLASTY  ?   PT UNABLE TO TELL IF  BEFORE OR AFTER  CABG  . CORONARY ARTERY BYPASS GRAFT  2008  ([place] , [place])   3 VESSEL  . DIALYSIS FISTULA CREATION  LAST SURGERY  APPOX  2008  . ENTEROSCOPY N/A 05/10/2016   Procedure: ENTEROSCOPY;  Surgeon: Jerene Bears, MD;  Location: [place];  Service: Gastroenterology;  Laterality: N/A;  . ENTEROSCOPY N/A 08/12/2016   Procedure: ENTEROSCOPY;  Surgeon: [name], MD;  Location: [facility] ENDOSCOPY;  Service: Endoscopy;  Laterality: N/A;  . ESOPHAGOGASTRODUODENOSCOPY N/A 05/07/2015   Procedure: ESOPHAGOGASTRODUODENOSCOPY (EGD);  Surgeon: Hulen Luster, MD;  Location: [facility] ENDOSCOPY;  Service: Endoscopy;  Laterality: N/A;  . ESOPHAGOGASTRODUODENOSCOPY (EGD) WITH PROPOFOL N/A 05/17/2015   Procedure: ESOPHAGOGASTRODUODENOSCOPY (EGD) WITH PROPOFOL;  Surgeon: [name], MD;  Location: [facility] ENDOSCOPY;  Service: Endoscopy;  Laterality: N/A;  . ESOPHAGOGASTRODUODENOSCOPY (EGD) WITH PROPOFOL N/A 01/20/2016   Procedure: ESOPHAGOGASTRODUODENOSCOPY (EGD) WITH PROPOFOL;  Surgeon: [name], MD;  Location: [facility] ENDOSCOPY;  Service: Endoscopy;  Laterality: N/A;  . ESOPHAGOGASTRODUODENOSCOPY (EGD) WITH PROPOFOL N/A 04/17/2016   Procedure: ESOPHAGOGASTRODUODENOSCOPY (EGD) WITH PROPOFOL;  Surgeon: Lin Landsman, MD;  Location: [facility] ENDOSCOPY;  Service: Gastroenterology;  Laterality: N/A;  . ESOPHAGOGASTRODUODENOSCOPY (EGD) WITH PROPOFOL  05/09/2016   Procedure: ESOPHAGOGASTRODUODENOSCOPY (EGD) WITH PROPOFOL;  Surgeon: Jerene Bears, MD;  Location: [place];  Service: Endoscopy;;  . ESOPHAGOGASTRODUODENOSCOPY (EGD) WITH PROPOFOL N/A 06/16/2016   Procedure: ESOPHAGOGASTRODUODENOSCOPY (EGD) WITH PROPOFOL;  Surgeon: [name], MD;  Location: [facility] ENDOSCOPY;  Service: Endoscopy;  Laterality: N/A;  . GIVENS CAPSULE STUDY N/A 05/07/2016   Procedure: GIVENS CAPSULE STUDY;  Surgeon: Doran Stabler, MD;  Location: [place];   Service: Endoscopy;  Laterality: N/A;  . MANDIBULAR HARDWARE REMOVAL N/A 07/29/2013   Procedure: REMOVAL OF ARCH BARS;  Surgeon: Theodoro Kos, DO;  Location: [place];  Service: Plastics;  Laterality: N/A;  . ORIF MANDIBULAR FRACTURE N/A 06/05/2013   Procedure: REPAIR OF MANDIBULAR FRACTURE x 2 with maxillo-mandibular fixation ;  Surgeon: Theodoro Kos, DO;  Location: [place];  Service: Plastics;  Laterality: N/A;  . PERIPHERAL ARTERIAL STENT GRAFT Left     Prior to Admission medications   Medication Sig Start Date End Date Taking? Authorizing Provider  atorvastatin (LIPITOR) 40 MG tablet Take 1 tablet (40 mg total) by mouth daily. 11/16/16  Yes Vaughan Basta, MD  budesonide-formoterol [facility]) 160-4.5 MCG/ACT inhaler Inhale 2 puffs into the lungs daily. 11/16/16  Yes Vaughan Basta, MD  calcium acetate (PHOSLO) 667 MG capsule Take 2,001 mg by mouth 3 (three) times daily.   Yes [provider]  cinacalcet (SENSIPAR) 30 MG tablet Take 1 tablet by mouth daily.   Yes [provider]  ferrous sulfate 325 (65 FE) MG tablet Take 1 tablet by mouth 3 (three) times daily. 01/21/17  Yes [provider]  furosemide (LASIX) 80 MG tablet Take 1 tablet (80 mg total) daily by mouth. 01/25/17  Yes Bridgett Larsson,  Sheppard Evens, MD  gabapentin (NEURONTIN) 300 MG capsule Take 1 capsule by mouth daily as directed 08/16/16  Yes [provider]  labetalol (NORMODYNE) 100 MG tablet Take 100 mg by mouth daily. 03/24/17  Yes [provider]  Multiple Vitamins-Minerals-FA (DIALYVITE SUPREME D) 3 MG TABS Take 1 tablet (3 mg total) by mouth daily. 11/28/16  Yes Wieting, Richard, MD  pantoprazole (PROTONIX) 40 MG tablet Take 1 tablet (40 mg total) by mouth daily. 11/28/16  Yes Wieting, Richard, MD  tiotropium (SPIRIVA HANDIHALER) 18 MCG inhalation capsule Place 1 capsule (18 mcg total) into inhaler and inhale daily. 11/16/16 11/16/17 Yes Vaughan Basta, MD  albuterol  (PROVENTIL HFA;VENTOLIN HFA) 108 (90 Base) MCG/ACT inhaler Inhale 2 puffs into the lungs every 6 (six) hours as needed for wheezing or shortness of breath. 02/19/17   Darel Hong, MD  alum & mag hydroxide-simeth (MAALOX/MYLANTA) 200-200-20 MG/5ML suspension Take 15 mLs every 4 (four) hours as needed by mouth for indigestion or heartburn. 01/25/17   Demetrios Loll, MD  ipratropium-albuterol (DUONEB) 0.5-2.5 (3) MG/3ML SOLN Take 3 mLs by nebulization every 6 (six) hours as needed. 11/28/16   Loletha Grayer, MD  lactulose (CHRONULAC) 10 GM/15ML solution Take 30 mLs (20 g total) by mouth 3 (three) times daily. Patient not taking: Reported on 04/05/2017 01/11/17   Bettey Costa, MD  nitroGLYCERIN (NITROSTAT) 0.4 MG SL tablet Place 1 tablet (0.4 mg total) under the tongue every 5 (five) minutes as needed. 04/12/16   Wende Bushy, MD  Spacer/Aero Chamber Mouthpiece MISC 1 Units by Does not apply route every 4 (four) hours as needed (wheezing). 02/19/17   Darel Hong, MD    Allergies Patient has no known allergies.  Family History  Problem Relation Age of Onset  . Colon cancer Mother   . Cancer Father   . Cancer Sister   . Kidney disease Brother     Social History Social History   Tobacco Use  . Smoking status: Current Every Day Smoker    Packs/day: 0.15    Years: 40.00    Pack years: 6.00    Types: Cigarettes  . Smokeless tobacco: Never Used  Substance Use Topics  . Alcohol use: No    Comment: pt reports quitting after learning about cirrhosis  . Drug use: No    Review of Systems  Constitutional: No fever/chills Eyes: No visual changes. ENT: No sore throat. Cardiovascular: Denies chest pain. Respiratory: Denies shortness of breath. Gastrointestinal: No abdominal pain.  No nausea, no vomiting.  No diarrhea.  No constipation. Genitourinary: Negative for dysuria. Musculoskeletal: Negative for back pain. Skin: Negative for rash. Neurological: Negative for headaches, focal weakness  or numbness.   ____________________________________________   PHYSICAL EXAM:  VITAL SIGNS: ED Triage Vitals [04/14/17 0841]  Enc Vitals Group     BP 119/75     Pulse Rate 83     Resp 18     Temp 97.9 F (36.6 C)     Temp Source Oral     SpO2 95 %     Weight 165 lb (74.8 kg)     Height 6\' 3"  (1.905 m)     Head Circumference      Peak Flow      Pain Score 0     Pain Loc      Pain Edu?      Excl. in [place]?     Constitutional: Alert and oriented. Well appearing and in no acute distress. Eyes: Conjunctivae are normal.  Head: Atraumatic.  Nose: No congestion/rhinnorhea. Mouth/Throat: Mucous membranes are moist.  Neck: No stridor.   Cardiovascular: Normal rate, regular rhythm. Grossly normal heart sounds.  Good peripheral circulation with palpable thrill to the right upper extremity fistula  respiratory: Normal respiratory effort.  No retractions. Lungs CTAB. Gastrointestinal: Soft and nontender. No distention.  Musculoskeletal: No lower extremity tenderness nor edema.   Neurologic:  Normal speech and language. No gross focal neurologic deficits are appreciated. Skin:  Skin is warm, dry and intact. No rash noted. Psychiatric: Mood and affect are normal. Speech and behavior are normal.  ____________________________________________   LABS (all labs ordered are listed, but only abnormal results are displayed)  Labs Reviewed - No data to display ____________________________________________  EKG   ____________________________________________  RADIOLOGY   ____________________________________________   PROCEDURES  Procedure(s) performed:   Procedures  Critical Care performed:   ____________________________________________   INITIAL IMPRESSION / ASSESSMENT AND PLAN / ED COURSE  Pertinent labs & imaging results that were available during my care of the patient were reviewed by me and considered in my medical decision making (see chart for details).  DDX: Acute  on chronic renal failure, scheduled dialysis, hyperkalemia, CHF As part of my medical decision making, I reviewed the following data within the [facility] Notes from prior ED visits  ----------------------------------------- 9:12 AM on 04/14/2017 -----------------------------------------  Discussed the case with Dr. Juleen China of nephrology who says that they are ready for the patient in dialysis.  Patient will be transferred to dialysis.     ____________________________________________   FINAL CLINICAL IMPRESSION(S) / ED DIAGNOSES  Chronic kidney disease.    NEW MEDICATIONS STARTED DURING THIS VISIT:  New Prescriptions   No medications on file     Note:  This document was prepared using Dragon voice recognition software and may include unintentional dictation errors.     Orbie Pyo, MD 04/14/17 [phone]

## 2017-04-14 NOTE — Progress Notes (Signed)
[place]

## 2017-04-14 NOTE — Progress Notes (Signed)
Pre HD Assessment  

## 2017-04-14 NOTE — ED Notes (Signed)
Patient inpatient to begin dialysis treatment.  Reassurance given that he would be transported to dialysis unit momentarily.  Patient using foul language, asked to refrain from using such language.  Patient responded "you can just get out of my face.  You are racists and so is this hospital  I know how this works".  Reassurance repeated and not well received.  Patient continued to use inappropriate language, security called and responded to bedside.  Patient transported to dialysis via wheelchair.

## 2017-04-14 NOTE — ED Notes (Signed)
Dialysis called for transport from dialysis to return to ED.  AC called for transport assist due to hight acuity in the ED.  AC called from dialysis unit stating that patient was not in the dialysis unit.  Assumed patient eloped.

## 2017-04-14 NOTE — ED Notes (Signed)
Patient denies pain and is resting comfortably.  

## 2017-04-14 NOTE — ED Triage Notes (Addendum)
Pt arrived via POV presenting for dialysis treatment today. Last treatment was on Wednesday- no problems.  Pt alert and oriented x 4.  Ambulatory in lobby, placed in wheelchair.

## 2017-04-14 NOTE — Progress Notes (Signed)
HD Tx started, pt refuses to complete more than 3 hrs of treatment to today. MD informed.

## 2017-04-14 NOTE — Progress Notes (Signed)
Post HD Tx, pt became rude yelling profanity toward Dr. Juleen China stating he no longer wanted him as a doctor and if he saw him "it was on", pt seemed to be upset about a prescription that he needed filled. I explained to the pt that if did not allow for assessment by Dr. Juleen China when he is on call that he would not be able to dialysis. Pt stated "He did not care and that he could go for a week without it". Pt then stated that the ED "crackers" were racist and he didn't need to wait for them he could walk on his own, and left the dialysis unit. MD was notified of events described above.

## 2017-04-18 NOTE — Discharge Instructions (Signed)
Paracentesis, Care After °Refer to this sheet in the next few weeks. These instructions provide you with information about caring for yourself after your procedure. Your health care provider may also give you more specific instructions. Your treatment has been planned according to current medical practices, but problems sometimes occur. Call your health care provider if you have any problems or questions after your procedure. °What can I expect after the procedure? °After your procedure, it is common to have a small amount of clear fluid coming from the puncture site. °Follow these instructions at home: °· Return to your normal activities as told by your health care provider. Ask your health care provider what activities are safe for you. °· Take over-the-counter and prescription medicines only as told by your health care provider. °· Do not take baths, swim, or use a hot tub until your health care provider approves. °· Follow instructions from your health care provider about: °? How to take care of your puncture site. °? When and how you should change your bandage (dressing). °? When you should remove your dressing. °· Check your puncture area every day signs of infection. Watch for: °? Redness, swelling, or pain. °? Fluid, blood, or pus. °· Keep all follow-up visits as told by your health care provider. This is important. °Contact a health care provider if: °· You have redness, swelling, or pain at your puncture site. °· You start to have more clear fluid coming from your puncture site. °· You have blood or pus coming from your puncture site. °· You have chills. °· You have a fever. °Get help right away if: °· You develop chest pain or shortness of breath. °· You develop increasing pain, discomfort, or swelling in your abdomen. °· You feel dizzy or light-headed or you pass out. °This information is not intended to replace advice given to you by your health care provider. Make sure you discuss any questions you  have with your health care provider. °Document Released: 07/15/2014 Document Revised: 08/06/2015 Document Reviewed: 05/13/2014 °Elsevier Interactive Patient Education © 2018 [employer] ° °

## 2017-04-19 ENCOUNTER — Emergency Department
Admission: EM | Admit: 2017-04-19 | Discharge: 2017-04-19 | Disposition: A | Discharge status: Home / Self Care | Payer: Medicare Other | Attending: Emergency Medicine | Admitting: Emergency Medicine

## 2017-04-19 ENCOUNTER — Other Ambulatory Visit: Payer: Self-pay

## 2017-04-19 ENCOUNTER — Encounter: Payer: Self-pay | Admitting: Emergency Medicine

## 2017-04-19 DIAGNOSIS — N186 End stage renal disease: Secondary | ICD-10-CM

## 2017-04-19 DIAGNOSIS — F17210 Nicotine dependence, cigarettes, uncomplicated: Secondary | ICD-10-CM | POA: Insufficient documentation

## 2017-04-19 DIAGNOSIS — J449 Chronic obstructive pulmonary disease, unspecified: Secondary | ICD-10-CM | POA: Diagnosis not present

## 2017-04-19 DIAGNOSIS — R188 Other ascites: Secondary | ICD-10-CM | POA: Diagnosis not present

## 2017-04-19 DIAGNOSIS — I132 Hypertensive heart and chronic kidney disease with heart failure and with stage 5 chronic kidney disease, or end stage renal disease: Secondary | ICD-10-CM | POA: Insufficient documentation

## 2017-04-19 DIAGNOSIS — Z79899 Other long term (current) drug therapy: Secondary | ICD-10-CM | POA: Diagnosis not present

## 2017-04-19 DIAGNOSIS — I120 Hypertensive chronic kidney disease with stage 5 chronic kidney disease or end stage renal disease: Secondary | ICD-10-CM | POA: Diagnosis not present

## 2017-04-19 DIAGNOSIS — I509 Heart failure, unspecified: Secondary | ICD-10-CM | POA: Insufficient documentation

## 2017-04-19 DIAGNOSIS — E875 Hyperkalemia: Secondary | ICD-10-CM | POA: Diagnosis not present

## 2017-04-19 DIAGNOSIS — Z992 Dependence on renal dialysis: Secondary | ICD-10-CM | POA: Diagnosis not present

## 2017-04-19 DIAGNOSIS — D631 Anemia in chronic kidney disease: Secondary | ICD-10-CM | POA: Diagnosis not present

## 2017-04-19 NOTE — Progress Notes (Signed)
Post HD Tx  

## 2017-04-19 NOTE — ED Notes (Signed)
Charge RN [place] spoke with dialysis. They report they will not be able to do dialysis on patient until 12:30 or 1:00pm. Patient states he will wait to get treatment

## 2017-04-19 NOTE — Progress Notes (Signed)
Pre HD assessment  

## 2017-04-19 NOTE — ED Notes (Signed)
Patient comes to [facility] for dialysis. Patient here this morning for Wednesday dialysis. Per [employer], no lab draws are needed. Patient ambulatory back to ER bed with NAD noted. Denies any pain

## 2017-04-19 NOTE — Progress Notes (Signed)
HD Tx ended  

## 2017-04-19 NOTE — Progress Notes (Signed)
Pre HD  

## 2017-04-19 NOTE — ED Notes (Signed)
Patient transported to dialysis

## 2017-04-19 NOTE — ED Triage Notes (Signed)
Patient states he is here for dialysis.  Takes dialysis 3 days a week for 3 hours.  Access left upper arm, no redness noted.  No other complaints verbalized.

## 2017-04-19 NOTE — ED Notes (Signed)
Pt returns from Dialysis in wheelchair and is stable, awaiting discharge paperwork.

## 2017-04-19 NOTE — ED Notes (Signed)
ED Provider at bedside. 

## 2017-04-19 NOTE — Progress Notes (Signed)
[facility], [place] 04/19/17  Subjective:   Patient presented to the emergency room for evaluation of dialysis He does not have a routine for dialysis His denies any acute complaints Abdomen is distended from ascites previous Labs from January 30 showed severe hyperkalemia    HEMODIALYSIS FLOWSHEET:  Blood Flow Rate (mL/min): 400 mL/min Arterial Pressure (mmHg): -170 mmHg Venous Pressure (mmHg): 120 mmHg Transmembrane Pressure (mmHg): 40 mmHg Ultrafiltration Rate (mL/min): 570 mL/min Dialysate Flow Rate (mL/min): 800 ml/min Conductivity: Machine : 14 Conductivity: Machine : 14 Dialysis Fluid Bolus: Normal Saline Bolus Amount (mL): 250 mL     Objective:  Vital signs in last 24 hours:  Temp:  [97.8 F (36.6 C)] 97.8 F (36.6 C) (02/06 0846) Pulse Rate:  [79-81] 80 (02/06 1010) Resp:  [14-16] 14 (02/06 1010) BP: (115-129)/(63-76) 129/75 (02/06 1010) SpO2:  [100 %] 100 % (02/06 0846) Weight:  [72.1 kg (159 lb)] 72.1 kg (159 lb) (02/06 0847)  Weight change:  Filed Weights   04/19/17 0847  Weight: 72.1 kg (159 lb)    Intake/Output:   No intake or output data in the 24 hours ending 04/19/17 1249   Physical Exam: General: NAD laying in bed  HEENT Moist oral mucus membranes  Neck supple  Pulm/lungs clear  CVS/Heart regular  Abdomen:  Soft, distended from ascites  Extremities:  Trace  Neurologic: Alert, oriented  Skin: No acute rashes  Access:  Right arm AV graft       Basic Metabolic Panel:  No results for input(s): NA, K, CL, CO2, GLUCOSE, BUN, CREATININE, CALCIUM, MG, PHOS in the last 168 hours.   CBC: No results for input(s): WBC, NEUTROABS, HGB, HCT, MCV, PLT in the last 168 hours.    Lab Results  Component Value Date   HEPBSAG Negative 03/01/2017   HEPBSAB Reactive 03/01/2017   HEPBIGM Negative 06/01/2016      Microbiology:  No results found for this or any previous visit (from the past 240  hour(s)).  Coagulation Studies: No results for input(s): LABPROT, INR in the last 72 hours.  Urinalysis: No results for input(s): COLORURINE, LABSPEC, PHURINE, GLUCOSEU, HGBUR, BILIRUBINUR, KETONESUR, PROTEINUR, UROBILINOGEN, NITRITE, LEUKOCYTESUR in the last 72 hours.  Invalid input(s): APPERANCEUR    Imaging: No results found.   Medications:       Assessment/ Plan:  57 y.o. male with end stage renal disease on hemodialysis secondary to Alport's syndrome, ascites, hypertension, anemia of chronic kidney disease, coronary artery disease, peripheral vascular disease, hyperlipidemia, gastrointestinal AVMs, echo in April 2018-> severe pulmonary hypertension, LVH, EF 50-55% moderate to severe mitral regurgitation, left atrial dilation, moderate tricuspid regurgitation likely leading to congestive hepatic insufficiency   [facility] [place] AVF- no outpatient dialysis unit currently  1. Hyperkalemia 2. Anemia of chronic kidney disease 3. Secondary Hyperparathyroidism 4. Ascites 5. Hypertension 6. ESRD  Hemodialysis today Volume removal with dialysis as tolerated     LOS: 0 Burdette Gergely [name] 2/6/201912:49 [place], [place]

## 2017-04-19 NOTE — ED Provider Notes (Addendum)
[facility] Emergency Department Provider Note  ____________________________________________   None    (approximate)  I have reviewed the triage vital signs and the nursing notes.   HISTORY  Chief Complaint No chief complaint on file.   HPI Kyllian Clingerman is a 57 y.o. male history of CHF, end-stage renal disease on dialysis was presented to the emergency department today for his scheduled dialysis.  Patient is denying any complaints.  Denies any chest pain or shortness of breath.  Past Medical History:  Diagnosis Date  . Alcohol abuse   . CHF (congestive heart failure) ([place])   . Cirrhosis ([place])   . Coronary artery disease 2009  . Diabetic peripheral neuropathy ([place])   . Drug abuse ([place])   . End stage renal disease on dialysis [facility]) NEPHROLOGIST-   DR [facility]  IN [place]   HEMODIALYSIS --   TUES/  THURS/  SAT  . GERD (gastroesophageal reflux disease)   . Hyperlipidemia   . Hypertension   . PAD (peripheral artery disease) ([place])   . Renal insufficiency    Per pt, 32 oz fluid restriction per day  . S/P triple vessel bypass 06/09/2016   2009ish  . Suicidal ideation    & HOMICIDAL IDEATION --  06-16-2013   ADMITTED TO BEHAVIOR HEALTH    Patient Active Problem List   Diagnosis Date Noted  . Uremia 03/08/2017  . Hemodialysis patient ([place]) 03/03/2017  . Weakness 02/28/2017  . Hypocalcemia 02/22/2017  . Shortness of breath 11/26/2016  . COPD (chronic obstructive pulmonary disease) ([place]) 10/30/2016  . COPD exacerbation ([place]) 10/29/2016  . Anemia   . Heme positive stool   . Ulceration of intestine   . Benign neoplasm of transverse colon   . Acute gastrointestinal hemorrhage   . Esophageal candidiasis ([place])   . Angiodysplasia of intestinal tract   . Acute respiratory failure with hypoxia ([place]) 07/03/2016  . GI bleeding 06/24/2016  . Rectal bleeding 06/14/2016  . Anemia of chronic disease 06/01/2016  . MRSA carrier 06/01/2016  . Chronic  renal failure 05/23/2016  . Ischemic heart disease 05/23/2016  . Angiodysplasia of small intestine   . Melena   . Small bowel bleed not requiring more than 4 units of blood in 24 hours, ICU, or surgery   . Anemia due to chronic blood loss   . Abdominal pain 05/05/2016  . Acute posthemorrhagic anemia 04/17/2016  . Gastrointestinal bleed 04/17/2016  . History of esophagogastroduodenoscopy (EGD) 04/17/2016  . Elevated troponin 04/17/2016  . Alcohol abuse 04/17/2016  . Upper GI bleed 01/19/2016  . Blood in stool   . Angiodysplasia of stomach and duodenum with hemorrhage   . Gastritis   . Esophagitis, unspecified   . GI bleed 05/16/2015  . Acute GI bleeding   . Symptomatic anemia 04/30/2015  . HTN (hypertension) 04/06/2015  . GERD (gastroesophageal reflux disease) 04/06/2015  . HLD (hyperlipidemia) 04/06/2015  . Dyspnea 04/06/2015  . Cirrhosis of liver with ascites ([place]) 04/06/2015  . Ascites 04/06/2015  . GIB (gastrointestinal bleeding) 03/23/2015  . Homicidal ideation 06/19/2013  . Suicidal intent 06/19/2013  . Homicidal ideations 06/19/2013  . Hyperkalemia 06/16/2013  . Mandible fracture ([place]) 06/05/2013  . Fracture, mandible ([place]) 06/02/2013  . Coronary atherosclerosis of native coronary artery 06/02/2013  . ESRD on dialysis ([place]) 06/02/2013  . Mandible open fracture ([place]) 06/02/2013    Past Surgical History:  Procedure Laterality Date  . AGILE CAPSULE N/A 06/19/2016   Procedure: AGILE CAPSULE;  Surgeon:  [name], MD;  Location: [facility] ENDOSCOPY;  Service: Endoscopy;  Laterality: N/A;  . COLONOSCOPY WITH PROPOFOL N/A 06/18/2016   Procedure: COLONOSCOPY WITH PROPOFOL;  Surgeon: [name], MD;  Location: [facility] ENDOSCOPY;  Service: Endoscopy;  Laterality: N/A;  . COLONOSCOPY WITH PROPOFOL N/A 08/12/2016   Procedure: COLONOSCOPY WITH PROPOFOL;  Surgeon: [name], MD;  Location: [facility] ENDOSCOPY;  Service: Endoscopy;  Laterality: N/A;  . CORONARY ANGIOPLASTY  ?   PT UNABLE TO  TELL IF  BEFORE OR AFTER  CABG  . CORONARY ARTERY BYPASS GRAFT  2008  ([place] , )   3 VESSEL  . DIALYSIS FISTULA CREATION  LAST SURGERY  APPOX  2008  . ENTEROSCOPY N/A 05/10/2016   Procedure: ENTEROSCOPY;  Surgeon: Jerene Bears, MD;  Location: [place];  Service: Gastroenterology;  Laterality: N/A;  . ENTEROSCOPY N/A 08/12/2016   Procedure: ENTEROSCOPY;  Surgeon: [name], MD;  Location: [facility] ENDOSCOPY;  Service: Endoscopy;  Laterality: N/A;  . ESOPHAGOGASTRODUODENOSCOPY N/A 05/07/2015   Procedure: ESOPHAGOGASTRODUODENOSCOPY (EGD);  Surgeon: Hulen Luster, MD;  Location: [facility] ENDOSCOPY;  Service: Endoscopy;  Laterality: N/A;  . ESOPHAGOGASTRODUODENOSCOPY (EGD) WITH PROPOFOL N/A 05/17/2015   Procedure: ESOPHAGOGASTRODUODENOSCOPY (EGD) WITH PROPOFOL;  Surgeon: [name], MD;  Location: [facility] ENDOSCOPY;  Service: Endoscopy;  Laterality: N/A;  . ESOPHAGOGASTRODUODENOSCOPY (EGD) WITH PROPOFOL N/A 01/20/2016   Procedure: ESOPHAGOGASTRODUODENOSCOPY (EGD) WITH PROPOFOL;  Surgeon: [name], MD;  Location: [facility] ENDOSCOPY;  Service: Endoscopy;  Laterality: N/A;  . ESOPHAGOGASTRODUODENOSCOPY (EGD) WITH PROPOFOL N/A 04/17/2016   Procedure: ESOPHAGOGASTRODUODENOSCOPY (EGD) WITH PROPOFOL;  Surgeon: Lin Landsman, MD;  Location: [facility] ENDOSCOPY;  Service: Gastroenterology;  Laterality: N/A;  . ESOPHAGOGASTRODUODENOSCOPY (EGD) WITH PROPOFOL  05/09/2016   Procedure: ESOPHAGOGASTRODUODENOSCOPY (EGD) WITH PROPOFOL;  Surgeon: Jerene Bears, MD;  Location: [place];  Service: Endoscopy;;  . ESOPHAGOGASTRODUODENOSCOPY (EGD) WITH PROPOFOL N/A 06/16/2016   Procedure: ESOPHAGOGASTRODUODENOSCOPY (EGD) WITH PROPOFOL;  Surgeon: [name], MD;  Location: [facility] ENDOSCOPY;  Service: Endoscopy;  Laterality: N/A;  . GIVENS CAPSULE STUDY N/A 05/07/2016   Procedure: GIVENS CAPSULE STUDY;  Surgeon: Doran Stabler, MD;  Location: [place];  Service: Endoscopy;  Laterality: N/A;  . MANDIBULAR HARDWARE REMOVAL N/A  07/29/2013   Procedure: REMOVAL OF ARCH BARS;  Surgeon: Theodoro Kos, DO;  Location: [place];  Service: Plastics;  Laterality: N/A;  . ORIF MANDIBULAR FRACTURE N/A 06/05/2013   Procedure: REPAIR OF MANDIBULAR FRACTURE x 2 with maxillo-mandibular fixation ;  Surgeon: Theodoro Kos, DO;  Location: [place];  Service: Plastics;  Laterality: N/A;  . PERIPHERAL ARTERIAL STENT GRAFT Left     Prior to Admission medications   Medication Sig Start Date End Date Taking? Authorizing Provider  albuterol (PROVENTIL HFA;VENTOLIN HFA) 108 (90 Base) MCG/ACT inhaler Inhale 2 puffs into the lungs every 6 (six) hours as needed for wheezing or shortness of breath. 02/19/17   Darel Hong, MD  alum & mag hydroxide-simeth (MAALOX/MYLANTA) 200-200-20 MG/5ML suspension Take 15 mLs every 4 (four) hours as needed by mouth for indigestion or heartburn. 01/25/17   Demetrios Loll, MD  atorvastatin (LIPITOR) 40 MG tablet Take 1 tablet (40 mg total) by mouth daily. 11/16/16   Vaughan Basta, MD  budesonide-formoterol (SYMBICORT) 160-4.5 MCG/ACT inhaler Inhale 2 puffs into the lungs daily. 11/16/16   Vaughan Basta, MD  calcium acetate (PHOSLO) 667 MG capsule Take 2,001 mg by mouth 3 (three) times daily.    [provider]  cinacalcet (SENSIPAR) 30 MG tablet Take 1 tablet by  mouth daily.    [provider]  ferrous sulfate 325 (65 FE) MG tablet Take 1 tablet by mouth 3 (three) times daily. 01/21/17   [provider]  furosemide (LASIX) 80 MG tablet Take 1 tablet (80 mg total) daily by mouth. 01/25/17   Demetrios Loll, MD  gabapentin (NEURONTIN) 300 MG capsule Take 1 capsule by mouth daily as directed 08/16/16   [provider]  ipratropium-albuterol (DUONEB) 0.5-2.5 (3) MG/3ML SOLN Take 3 mLs by nebulization every 6 (six) hours as needed. 11/28/16   Loletha Grayer, MD  labetalol (NORMODYNE) 100 MG tablet Take 100 mg by mouth daily. 03/24/17   [provider]    lactulose (CHRONULAC) 10 GM/15ML solution Take 30 mLs (20 g total) by mouth 3 (three) times daily. Patient not taking: Reported on 04/05/2017 01/11/17   Bettey Costa, MD  Multiple Vitamins-Minerals-FA (DIALYVITE SUPREME D) 3 MG TABS Take 1 tablet (3 mg total) by mouth daily. 11/28/16   Loletha Grayer, MD  nitroGLYCERIN (NITROSTAT) 0.4 MG SL tablet Place 1 tablet (0.4 mg total) under the tongue every 5 (five) minutes as needed. 04/12/16   Wende Bushy, MD  pantoprazole (PROTONIX) 40 MG tablet Take 1 tablet (40 mg total) by mouth daily. 11/28/16   Loletha Grayer, MD  Spacer/Aero Chamber Mouthpiece MISC 1 Units by Does not apply route every 4 (four) hours as needed (wheezing). 02/19/17   Darel Hong, MD  tiotropium (SPIRIVA HANDIHALER) 18 MCG inhalation capsule Place 1 capsule (18 mcg total) into inhaler and inhale daily. 11/16/16 11/16/17  Vaughan Basta, MD    Allergies Patient has no known allergies.  Family History  Problem Relation Age of Onset  . Colon cancer Mother   . Cancer Father   . Cancer Sister   . Kidney disease Brother     Social History Social History   Tobacco Use  . Smoking status: Current Every Day Smoker    Packs/day: 0.15    Years: 40.00    Pack years: 6.00    Types: Cigarettes  . Smokeless tobacco: Never Used  Substance Use Topics  . Alcohol use: No    Comment: pt reports quitting after learning about cirrhosis  . Drug use: No    Review of Systems  Constitutional: No fever/chills Eyes: No visual changes. ENT: No sore throat. Cardiovascular: Denies chest pain. Respiratory: Denies shortness of breath. Gastrointestinal: No abdominal pain.  No nausea, no vomiting.  No diarrhea.  No constipation. Genitourinary: Negative for dysuria. Musculoskeletal: Negative for back pain. Skin: Negative for rash. Neurological: Negative for headaches, focal weakness or numbness.   ____________________________________________   PHYSICAL EXAM:  VITAL  SIGNS: ED Triage Vitals  Enc Vitals Group     BP 04/19/17 0846 115/63     Pulse Rate 04/19/17 0846 81     Resp 04/19/17 0846 16     Temp 04/19/17 0846 97.8 F (36.6 C)     Temp Source 04/19/17 0846 Oral     SpO2 04/19/17 0846 100 %     Weight 04/19/17 0847 159 lb (72.1 kg)     Height 04/19/17 0847 6\' 3"  (1.905 m)     Head Circumference --      Peak Flow --      Pain Score 04/19/17 0846 0     Pain Loc --      Pain Edu? --      Excl. in [place]? --     Constitutional: Alert and oriented. Well appearing and in no acute  distress. Eyes: Conjunctivae are normal.  Head: Atraumatic. Nose: No congestion/rhinnorhea. Mouth/Throat: Mucous membranes are moist.  Neck: No stridor.   Cardiovascular: Normal rate, regular rhythm. Grossly normal heart sounds.  Palpable thrill to the right upper extremity dialysis fistula. Respiratory: Normal respiratory effort.  No retractions. Lungs CTAB. Gastrointestinal: Soft and nontender. No distention. Musculoskeletal: No lower extremity tenderness nor edema.  No joint effusions. Neurologic:  Normal speech and language. No gross focal neurologic deficits are appreciated. Skin:  Skin is warm, dry and intact. No rash noted. Psychiatric: Mood and affect are normal. Speech and behavior are normal.  ____________________________________________   LABS (all labs ordered are listed, but only abnormal results are displayed)  Labs Reviewed - No data to display ____________________________________________  EKG   ____________________________________________  RADIOLOGY   ____________________________________________   PROCEDURES  Procedure(s) performed:   Procedures  Critical Care performed:   ____________________________________________   INITIAL IMPRESSION / ASSESSMENT AND PLAN / ED COURSE  Pertinent labs & imaging results that were available during my care of the patient were reviewed by me and considered in my medical decision making (see chart  for details).  DDX: Acute renal failure, fluid overload, electrolyte abnormality As part of my medical decision making, I reviewed the following data within the [place] Notes from prior ED visits  Patient to be taken to dialysis.  Nurse, [facility], to call to alert nephrology service.      ____________________________________________   FINAL CLINICAL IMPRESSION(S) / ED DIAGNOSES  End-stage renal failure.    NEW MEDICATIONS STARTED DURING THIS VISIT:  New Prescriptions   No medications on file     Note:  This document was prepared using Dragon voice recognition software and may include unintentional dictation errors.     Maryella Abood, Randall An, MD 04/19/17 [phone]  She has been dialyzed.  No complaints at this time.  Requesting discharge.  Patient without any distress.   Orbie Pyo, MD 04/19/17 1430

## 2017-04-19 NOTE — Progress Notes (Signed)
HD started. 

## 2017-04-19 NOTE — Progress Notes (Signed)
Post HD  

## 2017-04-21 ENCOUNTER — Encounter: Payer: Self-pay | Admitting: Emergency Medicine

## 2017-04-21 ENCOUNTER — Other Ambulatory Visit: Payer: Self-pay

## 2017-04-21 ENCOUNTER — Emergency Department
Admission: EM | Admit: 2017-04-21 | Discharge: 2017-04-21 | Disposition: A | Discharge status: Home / Self Care | Payer: Medicare Other | Attending: Emergency Medicine | Admitting: Emergency Medicine

## 2017-04-21 DIAGNOSIS — Z955 Presence of coronary angioplasty implant and graft: Secondary | ICD-10-CM | POA: Insufficient documentation

## 2017-04-21 DIAGNOSIS — N2581 Secondary hyperparathyroidism of renal origin: Secondary | ICD-10-CM | POA: Diagnosis not present

## 2017-04-21 DIAGNOSIS — J449 Chronic obstructive pulmonary disease, unspecified: Secondary | ICD-10-CM | POA: Insufficient documentation

## 2017-04-21 DIAGNOSIS — N19 Unspecified kidney failure: Secondary | ICD-10-CM | POA: Insufficient documentation

## 2017-04-21 DIAGNOSIS — Z951 Presence of aortocoronary bypass graft: Secondary | ICD-10-CM | POA: Diagnosis not present

## 2017-04-21 DIAGNOSIS — Z79899 Other long term (current) drug therapy: Secondary | ICD-10-CM | POA: Insufficient documentation

## 2017-04-21 DIAGNOSIS — E1140 Type 2 diabetes mellitus with diabetic neuropathy, unspecified: Secondary | ICD-10-CM | POA: Diagnosis not present

## 2017-04-21 DIAGNOSIS — I2510 Atherosclerotic heart disease of native coronary artery without angina pectoris: Secondary | ICD-10-CM | POA: Insufficient documentation

## 2017-04-21 DIAGNOSIS — D631 Anemia in chronic kidney disease: Secondary | ICD-10-CM | POA: Diagnosis not present

## 2017-04-21 DIAGNOSIS — I10 Essential (primary) hypertension: Secondary | ICD-10-CM | POA: Insufficient documentation

## 2017-04-21 DIAGNOSIS — E875 Hyperkalemia: Secondary | ICD-10-CM | POA: Diagnosis not present

## 2017-04-21 DIAGNOSIS — R062 Wheezing: Secondary | ICD-10-CM | POA: Diagnosis not present

## 2017-04-21 DIAGNOSIS — F17210 Nicotine dependence, cigarettes, uncomplicated: Secondary | ICD-10-CM | POA: Diagnosis not present

## 2017-04-21 LAB — RENAL FUNCTION PANEL
ANION GAP: 14 (ref 5–15)
Albumin: 3 g/dL — ABNORMAL LOW (ref 3.5–5.0)
BUN: 49 mg/dL — ABNORMAL HIGH (ref 6–20)
CO2: 25 mmol/L (ref 22–32)
Calcium: 8.2 mg/dL — ABNORMAL LOW (ref 8.9–10.3)
Chloride: 101 mmol/L (ref 101–111)
Creatinine, Ser: 8.14 mg/dL — ABNORMAL HIGH (ref 0.61–1.24)
GFR calc Af Amer: 8 mL/min — ABNORMAL LOW (ref >60)
GFR, EST NON AFRICAN AMERICAN: 6 mL/min — AB (ref >60)
GLUCOSE: 70 mg/dL (ref 65–99)
POTASSIUM: 5.2 mmol/L — AB (ref 3.5–5.1)
Phosphorus: 7.7 mg/dL — ABNORMAL HIGH (ref 2.5–4.6)
Sodium: 140 mmol/L (ref 135–145)

## 2017-04-21 LAB — HEMOGLOBIN: HEMOGLOBIN: 8.5 g/dL — AB (ref 13.0–18.0)

## 2017-04-21 MED ORDER — ALBUTEROL SULFATE HFA 108 (90 BASE) MCG/ACT IN AERS: 2.0000 | INHALATION_SPRAY | Freq: Four times a day (QID) | RESPIRATORY_TRACT | 0 refills | Status: DC | PRN

## 2017-04-21 MED ORDER — IPRATROPIUM-ALBUTEROL 0.5-2.5 (3) MG/3ML IN SOLN
3.0000 mL | Freq: Once | RESPIRATORY_TRACT | Status: AC
  Administered 2017-04-21: 3 mL via RESPIRATORY_TRACT
  Filled 2017-04-21: qty 3

## 2017-04-21 NOTE — ED Triage Notes (Signed)
Pt here for dialysis treatment only. Will page nephrologist for orders.

## 2017-04-21 NOTE — ED Provider Notes (Addendum)
[facility] Emergency Department Provider Note ____________________________________________   I have reviewed the triage vital signs and the triage nursing note.  HISTORY  Chief Complaint Vascular Access Problem   Historian Patient  HPI Stanley [name] is a 57 y.o. male on dialysis, last hemodialysis was 2 days ago, Wednesday.  Patient at this point comes to the emergency department for dialysis because of behavioral issues he has been declined from the outpatient facilities in the area.  Apparently he is set up to get routine dialysis here.  Patient is not complaining of any acute complaints including no fevers, recent illnesses.  He is wheezing and has a history of wheezing and states he does not have an inhaler.  No fevers.  No nausea or vomiting.  No vision changes.  No headache.   Nephrologist was apparently contacted that this known patient was here and placed orders prior to dialysis plans for today.   Past Medical History:  Diagnosis Date  . Alcohol abuse   . CHF (congestive heart failure) ([place])   . Cirrhosis ([place])   . Coronary artery disease 2009  . Diabetic peripheral neuropathy ([place])   . Drug abuse ([place])   . End stage renal disease on dialysis [facility]) NEPHROLOGIST-   DR [facility]  IN [place]   HEMODIALYSIS --   TUES/  THURS/  SAT  . GERD (gastroesophageal reflux disease)   . Hyperlipidemia   . Hypertension   . PAD (peripheral artery disease) ([place])   . Renal insufficiency    Per pt, 32 oz fluid restriction per day  . S/P triple vessel bypass 06/09/2016   2009ish  . Suicidal ideation    & HOMICIDAL IDEATION --  06-16-2013   ADMITTED TO BEHAVIOR HEALTH    Patient Active Problem List   Diagnosis Date Noted  . Uremia 03/08/2017  . Hemodialysis patient ([place]) 03/03/2017  . Weakness 02/28/2017  . Hypocalcemia 02/22/2017  . Shortness of breath 11/26/2016  . COPD (chronic obstructive pulmonary disease) ([place]) 10/30/2016  . COPD  exacerbation ([place]) 10/29/2016  . Anemia   . Heme positive stool   . Ulceration of intestine   . Benign neoplasm of transverse colon   . Acute gastrointestinal hemorrhage   . Esophageal candidiasis ([place])   . Angiodysplasia of intestinal tract   . Acute respiratory failure with hypoxia ([place]) 07/03/2016  . GI bleeding 06/24/2016  . Rectal bleeding 06/14/2016  . Anemia of chronic disease 06/01/2016  . MRSA carrier 06/01/2016  . Chronic renal failure 05/23/2016  . Ischemic heart disease 05/23/2016  . Angiodysplasia of small intestine   . Melena   . Small bowel bleed not requiring more than 4 units of blood in 24 hours, ICU, or surgery   . Anemia due to chronic blood loss   . Abdominal pain 05/05/2016  . Acute posthemorrhagic anemia 04/17/2016  . Gastrointestinal bleed 04/17/2016  . History of esophagogastroduodenoscopy (EGD) 04/17/2016  . Elevated troponin 04/17/2016  . Alcohol abuse 04/17/2016  . Upper GI bleed 01/19/2016  . Blood in stool   . Angiodysplasia of stomach and duodenum with hemorrhage   . Gastritis   . Esophagitis, unspecified   . GI bleed 05/16/2015  . Acute GI bleeding   . Symptomatic anemia 04/30/2015  . HTN (hypertension) 04/06/2015  . GERD (gastroesophageal reflux disease) 04/06/2015  . HLD (hyperlipidemia) 04/06/2015  . Dyspnea 04/06/2015  . Cirrhosis of liver with ascites ([place]) 04/06/2015  . Ascites 04/06/2015  . GIB (gastrointestinal bleeding) 03/23/2015  .  Homicidal ideation 06/19/2013  . Suicidal intent 06/19/2013  . Homicidal ideations 06/19/2013  . Hyperkalemia 06/16/2013  . Mandible fracture ([place]) 06/05/2013  . Fracture, mandible ([place]) 06/02/2013  . Coronary atherosclerosis of native coronary artery 06/02/2013  . ESRD on dialysis ([place]) 06/02/2013  . Mandible open fracture ([place]) 06/02/2013    Past Surgical History:  Procedure Laterality Date  . AGILE CAPSULE N/A 06/19/2016   Procedure: AGILE CAPSULE;  Surgeon: [name], MD;  [facility]  ENDOSCOPY;  Service: Endoscopy;  Laterality: N/A;  . COLONOSCOPY WITH PROPOFOL N/A 06/18/2016   Procedure: COLONOSCOPY WITH PROPOFOL;  Surgeon: [name], MD;  Location: [facility] ENDOSCOPY;  Service: Endoscopy;  Laterality: N/A;  . COLONOSCOPY WITH PROPOFOL N/A 08/12/2016   Procedure: COLONOSCOPY WITH PROPOFOL;  Surgeon: [name], MD;  Location: [facility] ENDOSCOPY;  Service: Endoscopy;  Laterality: N/A;  . CORONARY ANGIOPLASTY  ?   PT UNABLE TO TELL IF  BEFORE OR AFTER  CABG  . CORONARY ARTERY BYPASS GRAFT  2008  ([place])   3 VESSEL  . DIALYSIS FISTULA CREATION  LAST SURGERY  APPOX  2008  . ENTEROSCOPY N/A 05/10/2016   Procedure: ENTEROSCOPY;  Surgeon: Jerene Bears, MD;  Location: [place];  Service: Gastroenterology;  Laterality: N/A;  . ENTEROSCOPY N/A 08/12/2016   Procedure: ENTEROSCOPY;  Surgeon: [name], MD;  Location: [facility] ENDOSCOPY;  Service: Endoscopy;  Laterality: N/A;  . ESOPHAGOGASTRODUODENOSCOPY N/A 05/07/2015   Procedure: ESOPHAGOGASTRODUODENOSCOPY (EGD);  Surgeon: Hulen Luster, MD;  Location: [facility] ENDOSCOPY;  Service: Endoscopy;  Laterality: N/A;  . ESOPHAGOGASTRODUODENOSCOPY (EGD) WITH PROPOFOL N/A 05/17/2015   Procedure: ESOPHAGOGASTRODUODENOSCOPY (EGD) WITH PROPOFOL;  Surgeon: [name], MD;  Location: [facility] ENDOSCOPY;  Service: Endoscopy;  Laterality: N/A;  . ESOPHAGOGASTRODUODENOSCOPY (EGD) WITH PROPOFOL N/A 01/20/2016   Procedure: ESOPHAGOGASTRODUODENOSCOPY (EGD) WITH PROPOFOL;  Surgeon: [name], MD;  Location: [facility] ENDOSCOPY;  Service: Endoscopy;  Laterality: N/A;  . ESOPHAGOGASTRODUODENOSCOPY (EGD) WITH PROPOFOL N/A 04/17/2016   Procedure: ESOPHAGOGASTRODUODENOSCOPY (EGD) WITH PROPOFOL;  Surgeon: Lin Landsman, MD;  Location: [facility] ENDOSCOPY;  Service: Gastroenterology;  Laterality: N/A;  . ESOPHAGOGASTRODUODENOSCOPY (EGD) WITH PROPOFOL  05/09/2016   Procedure: ESOPHAGOGASTRODUODENOSCOPY (EGD) WITH PROPOFOL;  Surgeon: Jerene Bears, MD;  Location: [place];   Service: Endoscopy;;  . ESOPHAGOGASTRODUODENOSCOPY (EGD) WITH PROPOFOL N/A 06/16/2016   Procedure: ESOPHAGOGASTRODUODENOSCOPY (EGD) WITH PROPOFOL;  Surgeon: [name], MD;  Location: [facility] ENDOSCOPY;  Service: Endoscopy;  Laterality: N/A;  . GIVENS CAPSULE STUDY N/A 05/07/2016   Procedure: GIVENS CAPSULE STUDY;  Surgeon: Doran Stabler, MD;  Location: [place];  Service: Endoscopy;  Laterality: N/A;  . MANDIBULAR HARDWARE REMOVAL N/A 07/29/2013   Procedure: REMOVAL OF ARCH BARS;  Surgeon: Theodoro Kos, DO;  Location: [place];  Service: Plastics;  Laterality: N/A;  . ORIF MANDIBULAR FRACTURE N/A 06/05/2013   Procedure: REPAIR OF MANDIBULAR FRACTURE x 2 with maxillo-mandibular fixation ;  Surgeon: Theodoro Kos, DO;  Location: [place];  Service: Plastics;  Laterality: N/A;  . PERIPHERAL ARTERIAL STENT GRAFT Left     Prior to Admission medications   Medication Sig Start Date End Date Taking? Authorizing Provider  albuterol (PROVENTIL HFA;VENTOLIN HFA) 108 (90 Base) MCG/ACT inhaler Inhale 2 puffs into the lungs every 6 (six) hours as needed for wheezing or shortness of breath. 04/21/17   Lisa Roca, MD  alum & mag hydroxide-simeth (MAALOX/MYLANTA) 200-200-20 MG/5ML suspension Take 15 mLs every 4 (four) hours as needed by mouth for indigestion or heartburn. Patient not taking:  Reported on 04/19/2017 01/25/17   Demetrios Loll, MD  atorvastatin (LIPITOR) 40 MG tablet Take 1 tablet (40 mg total) by mouth daily. 11/16/16   Vaughan Basta, MD  budesonide-formoterol (SYMBICORT) 160-4.5 MCG/ACT inhaler Inhale 2 puffs into the lungs daily. 11/16/16   Vaughan Basta, MD  calcium acetate (PHOSLO) 667 MG capsule Take 2,001 mg by mouth 3 (three) times daily.    [provider]  cinacalcet (SENSIPAR) 30 MG tablet Take 1 tablet by mouth daily.    [provider]  ferrous sulfate 325 (65 FE) MG tablet Take 1 tablet by mouth 3 (three) times daily. 01/21/17   [provider]  furosemide (LASIX) 80 MG tablet Take 1 tablet (80 mg total) daily by mouth. 01/25/17   Demetrios Loll, MD  gabapentin (NEURONTIN) 300 MG capsule Take 1 capsule by mouth daily as directed 08/16/16   [provider]  ipratropium-albuterol (DUONEB) 0.5-2.5 (3) MG/3ML SOLN Take 3 mLs by nebulization every 6 (six) hours as needed. 11/28/16   Loletha Grayer, MD  labetalol (NORMODYNE) 100 MG tablet Take 100 mg by mouth daily. 03/24/17   [provider]  lactulose (CHRONULAC) 10 GM/15ML solution Take 30 mLs (20 g total) by mouth 3 (three) times daily. Patient not taking: Reported on 04/05/2017 01/11/17   Bettey Costa, MD  Multiple Vitamins-Minerals-FA (DIALYVITE SUPREME D) 3 MG TABS Take 1 tablet (3 mg total) by mouth daily. 11/28/16   Loletha Grayer, MD  nitroGLYCERIN (NITROSTAT) 0.4 MG SL tablet Place 1 tablet (0.4 mg total) under the tongue every 5 (five) minutes as needed. 04/12/16   Wende Bushy, MD  pantoprazole (PROTONIX) 40 MG tablet Take 1 tablet (40 mg total) by mouth daily. 11/28/16   Loletha Grayer, MD  predniSONE (DELTASONE) 5 MG tablet Take by mouth taper from 4 doses each day to 1 dose and stop. 04/14/17   [provider]  Spacer/Aero Chamber Mouthpiece MISC 1 Units by Does not apply route every 4 (four) hours as needed (wheezing). 02/19/17   Darel Hong, MD  tiotropium (SPIRIVA HANDIHALER) 18 MCG inhalation capsule Place 1 capsule (18 mcg total) into inhaler and inhale daily. 11/16/16 11/16/17  Vaughan Basta, MD    No Known Allergies  Family History  Problem Relation Age of Onset  . Colon cancer Mother   . Cancer Father   . Cancer Sister   . Kidney disease Brother     Social History Social History   Tobacco Use  . Smoking status: Current Every Day Smoker    Packs/day: 0.15    Years: 40.00    Pack years: 6.00    Types: Cigarettes  . Smokeless tobacco: Never Used  Substance Use Topics  . Alcohol use: No    Comment: pt reports  quitting after learning about cirrhosis  . Drug use: No    Review of Systems  Constitutional: Negative for fever. Eyes: Negative for visual changes. ENT: Negative for sore throat. Cardiovascular: Negative for chest pain.   Respiratory: Negative for shortness of breath.  He does have some chronic wheezing. Gastrointestinal: Negative for abdominal pain, vomiting and diarrhea. Genitourinary: Negative for dysuria. Musculoskeletal: Negative for back pain. Skin: Negative for rash. Neurological: Negative for headache.  ____________________________________________   PHYSICAL EXAM:  VITAL SIGNS: ED Triage Vitals  Enc Vitals Group     BP 04/21/17 0903 111/72     Pulse Rate 04/21/17 0903 84     Resp 04/21/17 0903 20     Temp 04/21/17 0903 98.3 F (  36.8 C)     Temp Source 04/21/17 0903 Oral     SpO2 04/21/17 0903 100 %     Weight 04/21/17 0857 159 lb (72.1 kg)     Height 04/21/17 0904 6\' 3"  (1.905 m)     Head Circumference --      Peak Flow --      Pain Score 04/21/17 0857 0     Pain Loc --      Pain Edu? --      Excl. in [place]? --      Constitutional: Alert and oriented. Well appearing and in no distress. HEENT   Head: Normocephalic and atraumatic.      Eyes: Conjunctivae are normal. Pupils equal and round.       Ears:         Nose: No congestion/rhinnorhea.   Mouth/Throat: Mucous membranes are moist.   Neck: No stridor. Cardiovascular/Chest: Normal rate, regular rhythm.  No murmurs, rubs, or gallops. Respiratory: Normal respiratory effort without tachypnea nor retractions. Breath sounds are clear and equal bilaterally.  Moderate end expiratory wheezing in all fields, no distress. Gastrointestinal: Soft. No distention, no guarding, no rebound. Nontender.    Genitourinary/rectal:Deferred Musculoskeletal: Nontender with normal range of motion in all extremities. No joint effusions.  No lower extremity tenderness.  No edema. Neurologic:  Normal speech and language. No  gross or focal neurologic deficits are appreciated. Skin:  Skin is warm, dry and intact. No rash noted. Psychiatric: Mood and affect are normal. Speech and behavior are normal. Patient exhibits appropriate insight and judgment.   ____________________________________________  LABS (pertinent positives/negatives) I, Lisa Roca, MD the attending physician have reviewed the labs noted below.  Labs Reviewed  RENAL FUNCTION PANEL - Abnormal; Notable for the following components:      Result Value   Potassium 5.2 (*)    BUN 49 (*)    Creatinine, Ser 8.14 (*)    Calcium 8.2 (*)    Phosphorus 7.7 (*)    Albumin 3.0 (*)    GFR calc non Af Amer 6 (*)    GFR calc Af Amer 8 (*)    All other components within normal limits  HEMOGLOBIN - Abnormal; Notable for the following components:   Hemoglobin 8.5 (*)    All other components within normal limits    ____________________________________________  RADIOLOGY All Xrays were viewed by me.  Imaging interpreted by Radiologist, and I, Lisa Roca, MD the attending physician have reviewed the radiologist interpretation noted below.  None __________________________________________  PROCEDURES  Procedure(s) performed: None  Critical Care performed: None   ____________________________________________  ED COURSE / ASSESSMENT AND PLAN  Pertinent labs & imaging results that were available during my care of the patient were reviewed by me and considered in my medical decision making (see chart for details).   Patient here to receive his 3 times per week dialysis.  Nephrologist placed laboratory orders and will prepare for dialysis.  Patient does not seem to have any acute complaints, he is having some baseline wheezing without significant exacerbation.  I will write him for an albuterol inhaler.   Patient to dialysis.  Patient back from dialysis I completed his discharge paperwork.  CONSULTATIONS:  Nephrology for dialysis  orders.   Patient / Family / Caregiver informed of clinical course, medical decision-making process, and agree with plan.   ___________________________________________   FINAL CLINICAL IMPRESSION(S) / ED DIAGNOSES   Final diagnoses:  Renal failure, unspecified chronicity   Routine dialysis  ___________________________________________        Note: This dictation was prepared with Dragon dictation. Any transcriptional errors that result from this process are unintentional    Lisa Roca, MD 04/21/17 1441    Lisa Roca, MD 04/21/17 [phone]

## 2017-04-21 NOTE — Progress Notes (Signed)
[facility], [place] 04/21/17  Subjective:   Patient presented to the emergency room for evaluation of dialysis He does not have a routine for dialysis His denies any acute complaints Previous labs from 1/30 show hyperkalemia Seen during HD   HEMODIALYSIS FLOWSHEET:  Blood Flow Rate (mL/min): 400 mL/min Arterial Pressure (mmHg): -170 mmHg Venous Pressure (mmHg): 180 mmHg Transmembrane Pressure (mmHg): 70 mmHg Ultrafiltration Rate (mL/min): 710 mL/min Dialysate Flow Rate (mL/min): 800 ml/min Conductivity: Machine : 14.3 Conductivity: Machine : 14.3 Dialysis Fluid Bolus: Normal Saline Bolus Amount (mL): 250 mL      Objective:  Vital signs in last 24 hours:  Temp:  [98.2 F (36.8 C)-98.5 F (36.9 C)] 98.2 F (36.8 C) (02/08 1505) Pulse Rate:  [69-88] 84 (02/08 1505) Resp:  [15-20] 15 (02/08 1505) BP: (111-157)/(72-97) 136/77 (02/08 1505) SpO2:  [100 %] 100 % (02/08 1505) Weight:  [72.1 kg (158 lb 15.2 oz)-72.1 kg (159 lb)] 72.1 kg (158 lb 15.2 oz) (02/08 1125)  Weight change:  Filed Weights   04/21/17 0904 04/21/17 1120 04/21/17 1125  Weight: 72.1 kg (159 lb) 72.1 kg (158 lb 15.2 oz) 72.1 kg (158 lb 15.2 oz)    Intake/Output:    Intake/Output Summary (Last 24 hours) at 04/21/2017 1657 Last data filed at 04/21/2017 1505 Gross per 24 hour  Intake -  Output 2391 ml  Net -2391 ml     Physical Exam: General: NAD laying in bed  HEENT Moist oral mucus membranes  Neck supple  Pulm/lungs clear  CVS/Heart regular  Abdomen:  Soft, distended from ascites  Extremities:  Trace  Neurologic: Alert, oriented  Skin: No acute rashes  Access:  Right arm AV graft       Basic Metabolic Panel:  Recent Labs  Lab 04/21/17 0925  NA 140  K 5.2*  CL 101  CO2 25  GLUCOSE 70  BUN 49*  CREATININE 8.14*  CALCIUM 8.2*  PHOS 7.7*     CBC: Recent Labs  Lab 04/21/17 0925  HGB 8.5*      Lab Results  Component Value Date   HEPBSAG  Negative 03/01/2017   HEPBSAB Reactive 03/01/2017   HEPBIGM Negative 06/01/2016      Microbiology:  No results found for this or any previous visit (from the past 240 hour(s)).  Coagulation Studies: No results for input(s): LABPROT, INR in the last 72 hours.  Urinalysis: No results for input(s): COLORURINE, LABSPEC, PHURINE, GLUCOSEU, HGBUR, BILIRUBINUR, KETONESUR, PROTEINUR, UROBILINOGEN, NITRITE, LEUKOCYTESUR in the last 72 hours.  Invalid input(s): APPERANCEUR    Imaging: No results found.   Medications:       Assessment/ Plan:  57 y.o. male with end stage renal disease on hemodialysis secondary to Al[place]'s syndrome, ascites, hypertension, anemia of chronic kidney disease, coronary artery disease, peripheral vascular disease, hyperlipidemia, gastrointestinal AVMs, echo in April 2018-> severe pulmonary hypertension, LVH, EF 50-55% moderate to severe mitral regurgitation, left atrial dilation, moderate tricuspid regurgitation likely leading to congestive hepatic insufficiency   [facility] [place] AVF- no outpatient dialysis unit currently  1. Uremia 2. Anemia of chronic kidney disease 3. Secondary Hyperparathyroidism 4. Ascites 5. Hyperkalemia 6. ESRD  - Low K bath for HD - Volume removal with dialysis as tolerated     LOS: 0 Casidee Jann [name] 2/8/20194:57 PM  [place], [place]

## 2017-04-21 NOTE — ED Notes (Signed)
RN spoke with Dialysis. Pt will go to Dialysis at approx 12 noon.

## 2017-04-21 NOTE — Discharge Instructions (Signed)
Return to the emergency department for any trouble breathing, tachycardia, or any other symptoms concerning to you.

## 2017-04-24 ENCOUNTER — Ambulatory Visit
Admission: RE | Admit: 2017-04-24 | Discharge: 2017-04-24 | Disposition: A | Discharge status: Home / Self Care | Payer: Medicare Other | Source: Ambulatory Visit | Attending: Nephrology | Admitting: Nephrology

## 2017-04-25 ENCOUNTER — Other Ambulatory Visit: Payer: Self-pay

## 2017-04-25 ENCOUNTER — Emergency Department: Payer: Medicare Other

## 2017-04-25 ENCOUNTER — Ambulatory Visit
Admission: EM | Admit: 2017-04-25 | Discharge: 2017-04-25 | Disposition: A | Discharge status: Home / Self Care | Payer: Medicare Other | Attending: Emergency Medicine | Admitting: Emergency Medicine

## 2017-04-25 DIAGNOSIS — K219 Gastro-esophageal reflux disease without esophagitis: Secondary | ICD-10-CM | POA: Insufficient documentation

## 2017-04-25 DIAGNOSIS — D62 Acute posthemorrhagic anemia: Secondary | ICD-10-CM | POA: Diagnosis not present

## 2017-04-25 DIAGNOSIS — K2970 Gastritis, unspecified, without bleeding: Secondary | ICD-10-CM | POA: Diagnosis not present

## 2017-04-25 DIAGNOSIS — I2510 Atherosclerotic heart disease of native coronary artery without angina pectoris: Secondary | ICD-10-CM | POA: Diagnosis not present

## 2017-04-25 DIAGNOSIS — F17210 Nicotine dependence, cigarettes, uncomplicated: Secondary | ICD-10-CM | POA: Diagnosis not present

## 2017-04-25 DIAGNOSIS — E785 Hyperlipidemia, unspecified: Secondary | ICD-10-CM | POA: Insufficient documentation

## 2017-04-25 DIAGNOSIS — T8089XA Other complications following infusion, transfusion and therapeutic injection, initial encounter: Secondary | ICD-10-CM | POA: Insufficient documentation

## 2017-04-25 DIAGNOSIS — J449 Chronic obstructive pulmonary disease, unspecified: Secondary | ICD-10-CM | POA: Insufficient documentation

## 2017-04-25 DIAGNOSIS — I509 Heart failure, unspecified: Secondary | ICD-10-CM | POA: Diagnosis not present

## 2017-04-25 DIAGNOSIS — Z79899 Other long term (current) drug therapy: Secondary | ICD-10-CM | POA: Insufficient documentation

## 2017-04-25 DIAGNOSIS — E875 Hyperkalemia: Secondary | ICD-10-CM | POA: Insufficient documentation

## 2017-04-25 DIAGNOSIS — N186 End stage renal disease: Secondary | ICD-10-CM | POA: Insufficient documentation

## 2017-04-25 DIAGNOSIS — D638 Anemia in other chronic diseases classified elsewhere: Secondary | ICD-10-CM | POA: Diagnosis not present

## 2017-04-25 DIAGNOSIS — R188 Other ascites: Secondary | ICD-10-CM | POA: Diagnosis not present

## 2017-04-25 DIAGNOSIS — I132 Hypertensive heart and chronic kidney disease with heart failure and with stage 5 chronic kidney disease, or end stage renal disease: Secondary | ICD-10-CM | POA: Insufficient documentation

## 2017-04-25 DIAGNOSIS — R109 Unspecified abdominal pain: Secondary | ICD-10-CM | POA: Diagnosis not present

## 2017-04-25 DIAGNOSIS — Z992 Dependence on renal dialysis: Secondary | ICD-10-CM | POA: Insufficient documentation

## 2017-04-25 DIAGNOSIS — Y841 Kidney dialysis as the cause of abnormal reaction of the patient, or of later complication, without mention of misadventure at the time of the procedure: Secondary | ICD-10-CM | POA: Diagnosis not present

## 2017-04-25 DIAGNOSIS — E1122 Type 2 diabetes mellitus with diabetic chronic kidney disease: Secondary | ICD-10-CM | POA: Diagnosis not present

## 2017-04-25 DIAGNOSIS — K921 Melena: Secondary | ICD-10-CM | POA: Diagnosis not present

## 2017-04-25 DIAGNOSIS — D123 Benign neoplasm of transverse colon: Secondary | ICD-10-CM | POA: Insufficient documentation

## 2017-04-25 DIAGNOSIS — R0602 Shortness of breath: Secondary | ICD-10-CM | POA: Diagnosis not present

## 2017-04-25 DIAGNOSIS — K7460 Unspecified cirrhosis of liver: Secondary | ICD-10-CM | POA: Diagnosis not present

## 2017-04-25 MED ORDER — SODIUM CHLORIDE 0.9 % IV SOLN: 100.0000 mL | INTRAVENOUS | Status: DC | PRN

## 2017-04-25 MED ORDER — PENTAFLUOROPROP-TETRAFLUOROETH EX AERO: 1.0000 "application " | INHALATION_SPRAY | CUTANEOUS | Status: DC | PRN

## 2017-04-25 MED ORDER — ALTEPLASE 2 MG IJ SOLR
2.0000 mg | Freq: Once | INTRAMUSCULAR | Status: DC | PRN
  Filled 2017-04-25: qty 2

## 2017-04-25 MED ORDER — HEPARIN SODIUM (PORCINE) 1000 UNIT/ML DIALYSIS
1000.0000 [IU] | INTRAMUSCULAR | Status: DC | PRN
  Filled 2017-04-25: qty 1

## 2017-04-25 MED ORDER — LIDOCAINE-PRILOCAINE 2.5-2.5 % EX CREA: 1.0000 "application " | TOPICAL_CREAM | CUTANEOUS | Status: DC | PRN

## 2017-04-25 MED ORDER — LIDOCAINE HCL (PF) 1 % IJ SOLN: 5.0000 mL | INTRAMUSCULAR | Status: DC | PRN

## 2017-04-25 NOTE — Progress Notes (Addendum)
Documented erroroneously.

## 2017-04-25 NOTE — ED Notes (Signed)
Pt discharged home after verbalizing understanding of discharge instructions; nad noted. 

## 2017-04-25 NOTE — ED Provider Notes (Signed)
[facility] Emergency Department Provider Note  ____________________________________________  Time seen: Approximately 7:13 AM  I have reviewed the triage vital signs and the nursing notes.   HISTORY  Chief Complaint Shortness of Breath and Abdominal Pain   HPI Stanley [name] is a 57 y.o. male ESRD on HD (MWF) who presents to the ED for abdominal distention and SOB. Patient missed HD and scheduled paracentesis yesterday because he did not feel good. He comes to the hospital every 2 weeks for paracentesis with the last one 01/28 and 3 times a week for HD last one 2/8. Patient is complaining of severe abdominal distention that has become progressively worse over the last few days associated with moderate constant SOB. Patient denies fever, chills, abdominal pain, cough, CP. Patient does not have a HD center due to behavioral issues and usually comes to the ED for his treatment.   Past Medical History:  Diagnosis Date  . Alcohol abuse   . CHF (congestive heart failure) ([place])   . Cirrhosis ([place])   . Coronary artery disease 2009  . Diabetic peripheral neuropathy ([place])   . Drug abuse ([place])   . End stage renal disease on dialysis [facility]) NEPHROLOGIST-   DR [facility]  IN [place]   HEMODIALYSIS --   TUES/  THURS/  SAT  . GERD (gastroesophageal reflux disease)   . Hyperlipidemia   . Hypertension   . PAD (peripheral artery disease) ([place])   . Renal insufficiency    Per pt, 32 oz fluid restriction per day  . S/P triple vessel bypass 06/09/2016   2009ish  . Suicidal ideation    & HOMICIDAL IDEATION --  06-16-2013   ADMITTED TO BEHAVIOR HEALTH    Patient Active Problem List   Diagnosis Date Noted  . Uremia 03/08/2017  . Hemodialysis patient ([place]) 03/03/2017  . Weakness 02/28/2017  . Hypocalcemia 02/22/2017  . Shortness of breath 11/26/2016  . COPD (chronic obstructive pulmonary disease) ([place]) 10/30/2016  . COPD exacerbation ([place]) 10/29/2016  . Anemia   .  Heme positive stool   . Ulceration of intestine   . Benign neoplasm of transverse colon   . Acute gastrointestinal hemorrhage   . Esophageal candidiasis ([place])   . Angiodysplasia of intestinal tract   . Acute respiratory failure with hypoxia ([place]) 07/03/2016  . GI bleeding 06/24/2016  . Rectal bleeding 06/14/2016  . Anemia of chronic disease 06/01/2016  . MRSA carrier 06/01/2016  . Chronic renal failure 05/23/2016  . Ischemic heart disease 05/23/2016  . Angiodysplasia of small intestine   . Melena   . Small bowel bleed not requiring more than 4 units of blood in 24 hours, ICU, or surgery   . Anemia due to chronic blood loss   . Abdominal pain 05/05/2016  . Acute posthemorrhagic anemia 04/17/2016  . Gastrointestinal bleed 04/17/2016  . History of esophagogastroduodenoscopy (EGD) 04/17/2016  . Elevated troponin 04/17/2016  . Alcohol abuse 04/17/2016  . Upper GI bleed 01/19/2016  . Blood in stool   . Angiodysplasia of stomach and duodenum with hemorrhage   . Gastritis   . Esophagitis, unspecified   . GI bleed 05/16/2015  . Acute GI bleeding   . Symptomatic anemia 04/30/2015  . HTN (hypertension) 04/06/2015  . GERD (gastroesophageal reflux disease) 04/06/2015  . HLD (hyperlipidemia) 04/06/2015  . Dyspnea 04/06/2015  . Cirrhosis of liver with ascites ([place]) 04/06/2015  . Ascites 04/06/2015  . GIB (gastrointestinal bleeding) 03/23/2015  . Homicidal ideation 06/19/2013  . Suicidal  intent 06/19/2013  . Homicidal ideations 06/19/2013  . Hyperkalemia 06/16/2013  . Mandible fracture ([place]) 06/05/2013  . Fracture, mandible ([place]) 06/02/2013  . Coronary atherosclerosis of native coronary artery 06/02/2013  . ESRD on dialysis ([place]) 06/02/2013  . Mandible open fracture ([place]) 06/02/2013    Past Surgical History:  Procedure Laterality Date  . AGILE CAPSULE N/A 06/19/2016   Procedure: AGILE CAPSULE;  Surgeon: [name], MD;  Location: [facility] ENDOSCOPY;  Service: Endoscopy;  Laterality:  N/A;  . COLONOSCOPY WITH PROPOFOL N/A 06/18/2016   Procedure: COLONOSCOPY WITH PROPOFOL;  Surgeon: [name], MD;  Location: [facility] ENDOSCOPY;  Service: Endoscopy;  Laterality: N/A;  . COLONOSCOPY WITH PROPOFOL N/A 08/12/2016   Procedure: COLONOSCOPY WITH PROPOFOL;  Surgeon: [name], MD;  Location[facility] ENDOSCOPY;  Service: Endoscopy;  Laterality: N/A;  . CORONARY ANGIOPLASTY  ?   PT UNABLE TO TELL IF  BEFORE OR AFTER  CABG  . CORONARY ARTERY BYPASS GRAFT  2008  ([place])   3 VESSEL  . DIALYSIS FISTULA CREATION  LAST SURGERY  APPOX  2008  . ENTEROSCOPY N/A 05/10/2016   Procedure: ENTEROSCOPY;  Surgeon: Jerene Bears, MD;  Location: [place];  Service: Gastroenterology;  Laterality: N/A;  . ENTEROSCOPY N/A 08/12/2016   Procedure: ENTEROSCOPY;  Surgeon: [name], MD;  Location: [facility] ENDOSCOPY;  Service: Endoscopy;  Laterality: N/A;  . ESOPHAGOGASTRODUODENOSCOPY N/A 05/07/2015   Procedure: ESOPHAGOGASTRODUODENOSCOPY (EGD);  Surgeon: Hulen Luster, MD;  Location[facility] ENDOSCOPY;  Service: Endoscopy;  Laterality: N/A;  . ESOPHAGOGASTRODUODENOSCOPY (EGD) WITH PROPOFOL N/A 05/17/2015   Procedure: ESOPHAGOGASTRODUODENOSCOPY (EGD) WITH PROPOFOL;  Surgeon: [name], MD;  Location: [facility] ENDOSCOPY;  Service: Endoscopy;  Laterality: N/A;  . ESOPHAGOGASTRODUODENOSCOPY (EGD) WITH PROPOFOL N/A 01/20/2016   Procedure: ESOPHAGOGASTRODUODENOSCOPY (EGD) WITH PROPOFOL;  Surgeon: [name], MD;  Location: [facility] ENDOSCOPY;  Service: Endoscopy;  Laterality: N/A;  . ESOPHAGOGASTRODUODENOSCOPY (EGD) WITH PROPOFOL N/A 04/17/2016   Procedure: ESOPHAGOGASTRODUODENOSCOPY (EGD) WITH PROPOFOL;  Surgeon: Lin Landsman, MD;  Location: [facility] ENDOSCOPY;  Service: Gastroenterology;  Laterality: N/A;  . ESOPHAGOGASTRODUODENOSCOPY (EGD) WITH PROPOFOL  05/09/2016   Procedure: ESOPHAGOGASTRODUODENOSCOPY (EGD) WITH PROPOFOL;  Surgeon: Jerene Bears, MD;  Location: [place];  Service: Endoscopy;;  . ESOPHAGOGASTRODUODENOSCOPY  (EGD) WITH PROPOFOL N/A 06/16/2016   Procedure: ESOPHAGOGASTRODUODENOSCOPY (EGD) WITH PROPOFOL;  Surgeon: [name], MD;  Location: [facility] ENDOSCOPY;  Service: Endoscopy;  Laterality: N/A;  . GIVENS CAPSULE STUDY N/A 05/07/2016   Procedure: GIVENS CAPSULE STUDY;  Surgeon: Doran Stabler, MD;  Location: [place];  Service: Endoscopy;  Laterality: N/A;  . MANDIBULAR HARDWARE REMOVAL N/A 07/29/2013   Procedure: REMOVAL OF ARCH BARS;  Surgeon: Theodoro Kos, DO;  Location: [place];  Service: Plastics;  Laterality: N/A;  . ORIF MANDIBULAR FRACTURE N/A 06/05/2013   Procedure: REPAIR OF MANDIBULAR FRACTURE x 2 with maxillo-mandibular fixation ;  Surgeon: Theodoro Kos, DO;  Location: [place];  Service: Plastics;  Laterality: N/A;  . PERIPHERAL ARTERIAL STENT GRAFT Left     Prior to Admission medications   Medication Sig Start Date End Date Taking? Authorizing Provider  albuterol (PROVENTIL HFA;VENTOLIN HFA) 108 (90 Base) MCG/ACT inhaler Inhale 2 puffs into the lungs every 6 (six) hours as needed for wheezing or shortness of breath. 04/21/17   Lisa Roca, MD  alum & mag hydroxide-simeth (MAALOX/MYLANTA) 200-200-20 MG/5ML suspension Take 15 mLs every 4 (four) hours as needed by mouth for indigestion or heartburn. Patient not taking: Reported on 04/19/2017 01/25/17  Demetrios Loll, MD  atorvastatin (LIPITOR) 40 MG tablet Take 1 tablet (40 mg total) by mouth daily. 11/16/16   Vaughan Basta, MD  budesonide-formoterol (SYMBICORT) 160-4.5 MCG/ACT inhaler Inhale 2 puffs into the lungs daily. 11/16/16   Vaughan Basta, MD  calcium acetate (PHOSLO) 667 MG capsule Take 2,001 mg by mouth 3 (three) times daily.    [provider]  cinacalcet (SENSIPAR) 30 MG tablet Take 1 tablet by mouth daily.    [provider]  ferrous sulfate 325 (65 FE) MG tablet Take 1 tablet by mouth 3 (three) times daily. 01/21/17   [provider]  furosemide (LASIX) 80 MG tablet Take  1 tablet (80 mg total) daily by mouth. 01/25/17   Demetrios Loll, MD  gabapentin (NEURONTIN) 300 MG capsule Take 1 capsule by mouth daily as directed 08/16/16   [provider]  ipratropium-albuterol (DUONEB) 0.5-2.5 (3) MG/3ML SOLN Take 3 mLs by nebulization every 6 (six) hours as needed. 11/28/16   Loletha Grayer, MD  labetalol (NORMODYNE) 100 MG tablet Take 100 mg by mouth daily. 03/24/17   [provider]  lactulose (CHRONULAC) 10 GM/15ML solution Take 30 mLs (20 g total) by mouth 3 (three) times daily. Patient not taking: Reported on 04/05/2017 01/11/17   Bettey Costa, MD  Multiple Vitamins-Minerals-FA (DIALYVITE SUPREME D) 3 MG TABS Take 1 tablet (3 mg total) by mouth daily. 11/28/16   Loletha Grayer, MD  nitroGLYCERIN (NITROSTAT) 0.4 MG SL tablet Place 1 tablet (0.4 mg total) under the tongue every 5 (five) minutes as needed. 04/12/16   Wende Bushy, MD  pantoprazole (PROTONIX) 40 MG tablet Take 1 tablet (40 mg total) by mouth daily. 11/28/16   Loletha Grayer, MD  predniSONE (DELTASONE) 5 MG tablet Take by mouth taper from 4 doses each day to 1 dose and stop. 04/14/17   [provider]  Spacer/Aero Chamber Mouthpiece MISC 1 Units by Does not apply route every 4 (four) hours as needed (wheezing). 02/19/17   Darel Hong, MD  tiotropium (SPIRIVA HANDIHALER) 18 MCG inhalation capsule Place 1 capsule (18 mcg total) into inhaler and inhale daily. 11/16/16 11/16/17  Vaughan Basta, MD    Allergies Patient has no known allergies.  Family History  Problem Relation Age of Onset  . Colon cancer Mother   . Cancer Father   . Cancer Sister   . Kidney disease Brother     Social History Social History   Tobacco Use  . Smoking status: Current Every Day Smoker    Packs/day: 0.15    Years: 40.00    Pack years: 6.00    Types: Cigarettes  . Smokeless tobacco: Never Used  Substance Use Topics  . Alcohol use: No    Comment: pt reports quitting after learning about  cirrhosis  . Drug use: No    Review of Systems  Constitutional: Negative for fever. Eyes: Negative for visual changes. ENT: Negative for sore throat. Neck: No neck pain  Cardiovascular: Negative for chest pain. Respiratory: + shortness of breath. Gastrointestinal: + abdominal distention. No vomiting or diarrhea. Genitourinary: Negative for dysuria. Musculoskeletal: Negative for back pain. Skin: Negative for rash. Neurological: Negative for headaches, weakness or numbness. Psych: No SI or HI  ____________________________________________   PHYSICAL EXAM:  VITAL SIGNS: ED Triage Vitals  Enc Vitals Group     BP 04/25/17 0631 129/87     Pulse Rate 04/25/17 0631 86     Resp 04/25/17 0631 (!) 22     Temp 04/25/17 0631 97.8  F (36.6 C)     Temp Source 04/25/17 0631 Oral     SpO2 04/25/17 0630 98 %     Weight 04/25/17 0632 159 lb (72.1 kg)     Height 04/25/17 0632 6\' 3"  (1.905 m)     Head Circumference --      Peak Flow --      Pain Score 04/25/17 0630 8     Pain Loc --      Pain Edu? --      Excl. in [place]? --     Constitutional: Alert and oriented. Well appearing and in no apparent distress. HEENT:      Head: Normocephalic and atraumatic.         Eyes: Conjunctivae are normal. Sclera is non-icteric.       Mouth/Throat: Mucous membranes are moist.       Neck: Supple with no signs of meningismus. Cardiovascular: Regular rate and rhythm. No murmurs, gallops, or rubs. 2+ symmetrical distal pulses are present in all extremities. No JVD. Respiratory: Normal respiratory effort. Lungs are clear to auscultation bilaterally. No wheezes, crackles, or rhonchi.  Gastrointestinal: Soft, non tender, severely distended with positive bowel sounds. No rebound or guarding. Musculoskeletal: Nontender with normal range of motion in all extremities. No edema, cyanosis, or erythema of extremities. Neurologic: Normal speech and language. Face is symmetric. Moving all extremities. No gross focal  neurologic deficits are appreciated. Skin: Skin is warm, dry and intact. No rash noted. Psychiatric: Mood and affect are normal. Speech and behavior are normal.  ____________________________________________   LABS (all labs ordered are listed, but only abnormal results are displayed)  Labs Reviewed - No data to display ____________________________________________  EKG  None   ____________________________________________  RADIOLOGY  none ____________________________________________   PROCEDURES  Procedure(s) performed: None Procedures Critical Care performed:  None ____________________________________________   INITIAL IMPRESSION / ASSESSMENT AND PLAN / ED COURSE  57 y.o. male ESRD on HD (MWF) who presents to the ED for abdominal distention and SOB. Patient is due for biweekly paracentesis and HD missed yesterday. Patient with normal WOB and normal sats. No tenderness on abdominal examination and no fever, no finding concerning for SBP. Discussed with [name] for paracentesis and Dr. Holley Raring for HD.      As part of my medical decision making, I reviewed the following data within the [place] notes reviewed and incorporated, Notes from prior ED visits and [place] Controlled Substance Database, consult placed with radiology and nephrology    Pertinent labs & imaging results that were available during my care of the patient were reviewed by me and considered in my medical decision making (see chart for details).    ____________________________________________   FINAL CLINICAL IMPRESSION(S) / ED DIAGNOSES  Final diagnoses:  Ascites  Encounter for dialysis ([place])  Dialysis-associated ascites      NEW MEDICATIONS STARTED DURING THIS VISIT:  ED Discharge Orders    None       Note:  This document was prepared using Dragon voice recognition software and may include unintentional dictation errors.    Alfred Levins, [place], MD 04/25/17 [phone]

## 2017-04-25 NOTE — Progress Notes (Signed)
Post HD assessment unchanged  

## 2017-04-25 NOTE — Procedures (Signed)
PROCEDURE SUMMARY:  Successful US guided paracentesis from right lateral abdomen.  Yielded 8.1 of clear yellow fluid.  No immediate complications.  Pt tolerated well.   Ruchama Kubicek S Ople Girgis PA-C 04/25/2017 10:01 AM

## 2017-04-25 NOTE — Progress Notes (Signed)
Pre HD assessment  

## 2017-04-25 NOTE — ED Notes (Signed)
Called Dialysis and spoke with Tonya. Per Kenney Houseman bring pt in about 10 minutes they are setting up for his treatment. Pt notified

## 2017-04-25 NOTE — ED Provider Notes (Signed)
-----------------------------------------   4:31 PM on 04/25/2017 -----------------------------------------  Patient has returned from paracentesis, 8.1 L of fluid have been removed.  Patient also has received his normal hemodialysis.  Currently the patient appears very well, no distress, has no complaints at this time.  No shortness of breath.  We will discharge the patient home. I reviewed the patient's recent medical records from recent ER visits.   Harvest Dark, MD 04/25/17 [phone]

## 2017-04-25 NOTE — Progress Notes (Signed)
HD ended with 20 minutes remaining. Patient signed off due to needing to catch his ride. Vitals stable post treatment. Report called to [facility] in ED. Patient left unit in stable condition.

## 2017-04-25 NOTE — ED Triage Notes (Signed)
Per EMS, pt gets dialysis MWF however pt was not able to go dialysis yest due to "couldn't breath and stomach hurts." EMS reports hx of COPD and paracentesis every 2 weeks. Pt denies wearing oxygen at home, pt placed on 2L with EMS and is 98%, HR 88 and BP 141/96.

## 2017-04-28 ENCOUNTER — Non-Acute Institutional Stay
Admission: EM | Admit: 2017-04-28 | Discharge: 2017-04-28 | Disposition: A | Discharge status: Home / Self Care | Payer: Medicare Other | Attending: Emergency Medicine | Admitting: Emergency Medicine

## 2017-04-28 ENCOUNTER — Encounter: Payer: Self-pay | Admitting: Intensive Care

## 2017-04-28 DIAGNOSIS — I132 Hypertensive heart and chronic kidney disease with heart failure and with stage 5 chronic kidney disease, or end stage renal disease: Secondary | ICD-10-CM | POA: Insufficient documentation

## 2017-04-28 DIAGNOSIS — I120 Hypertensive chronic kidney disease with stage 5 chronic kidney disease or end stage renal disease: Secondary | ICD-10-CM | POA: Diagnosis not present

## 2017-04-28 DIAGNOSIS — Z951 Presence of aortocoronary bypass graft: Secondary | ICD-10-CM | POA: Insufficient documentation

## 2017-04-28 DIAGNOSIS — Z79899 Other long term (current) drug therapy: Secondary | ICD-10-CM | POA: Diagnosis not present

## 2017-04-28 DIAGNOSIS — R188 Other ascites: Secondary | ICD-10-CM | POA: Diagnosis not present

## 2017-04-28 DIAGNOSIS — I509 Heart failure, unspecified: Secondary | ICD-10-CM | POA: Insufficient documentation

## 2017-04-28 DIAGNOSIS — D631 Anemia in chronic kidney disease: Secondary | ICD-10-CM | POA: Diagnosis not present

## 2017-04-28 DIAGNOSIS — N2581 Secondary hyperparathyroidism of renal origin: Secondary | ICD-10-CM | POA: Insufficient documentation

## 2017-04-28 DIAGNOSIS — D62 Acute posthemorrhagic anemia: Secondary | ICD-10-CM | POA: Insufficient documentation

## 2017-04-28 DIAGNOSIS — E785 Hyperlipidemia, unspecified: Secondary | ICD-10-CM | POA: Insufficient documentation

## 2017-04-28 DIAGNOSIS — J449 Chronic obstructive pulmonary disease, unspecified: Secondary | ICD-10-CM | POA: Diagnosis not present

## 2017-04-28 DIAGNOSIS — F17210 Nicotine dependence, cigarettes, uncomplicated: Secondary | ICD-10-CM | POA: Diagnosis not present

## 2017-04-28 DIAGNOSIS — I2510 Atherosclerotic heart disease of native coronary artery without angina pectoris: Secondary | ICD-10-CM | POA: Diagnosis not present

## 2017-04-28 DIAGNOSIS — K7460 Unspecified cirrhosis of liver: Secondary | ICD-10-CM | POA: Insufficient documentation

## 2017-04-28 DIAGNOSIS — E1122 Type 2 diabetes mellitus with diabetic chronic kidney disease: Secondary | ICD-10-CM | POA: Diagnosis not present

## 2017-04-28 DIAGNOSIS — E1151 Type 2 diabetes mellitus with diabetic peripheral angiopathy without gangrene: Secondary | ICD-10-CM | POA: Insufficient documentation

## 2017-04-28 DIAGNOSIS — Z992 Dependence on renal dialysis: Secondary | ICD-10-CM | POA: Diagnosis not present

## 2017-04-28 DIAGNOSIS — N186 End stage renal disease: Secondary | ICD-10-CM | POA: Insufficient documentation

## 2017-04-28 DIAGNOSIS — E1142 Type 2 diabetes mellitus with diabetic polyneuropathy: Secondary | ICD-10-CM | POA: Diagnosis not present

## 2017-04-28 DIAGNOSIS — K219 Gastro-esophageal reflux disease without esophagitis: Secondary | ICD-10-CM | POA: Insufficient documentation

## 2017-04-28 DIAGNOSIS — E875 Hyperkalemia: Secondary | ICD-10-CM | POA: Diagnosis not present

## 2017-04-28 LAB — RENAL FUNCTION PANEL
Albumin: 2.7 g/dL — ABNORMAL LOW (ref 3.5–5.0)
Anion gap: 13 (ref 5–15)
BUN: 51 mg/dL — AB (ref 6–20)
CALCIUM: 8.2 mg/dL — AB (ref 8.9–10.3)
CO2: 30 mmol/L (ref 22–32)
CREATININE: 9.33 mg/dL — AB (ref 0.61–1.24)
Chloride: 96 mmol/L — ABNORMAL LOW (ref 101–111)
GFR, EST AFRICAN AMERICAN: 6 mL/min — AB (ref >60)
GFR, EST NON AFRICAN AMERICAN: 5 mL/min — AB (ref >60)
Glucose, Bld: 70 mg/dL (ref 65–99)
Phosphorus: 7.2 mg/dL — ABNORMAL HIGH (ref 2.5–4.6)
Potassium: 6.4 mmol/L (ref 3.5–5.1)
SODIUM: 139 mmol/L (ref 135–145)

## 2017-04-28 LAB — PHOSPHORUS: PHOSPHORUS: 7.2 mg/dL — AB (ref 2.5–4.6)

## 2017-04-28 LAB — CBC
HCT: 26.8 % — ABNORMAL LOW (ref 40.0–52.0)
Hemoglobin: 8.4 g/dL — ABNORMAL LOW (ref 13.0–18.0)
MCH: 26.7 pg (ref 26.0–34.0)
MCHC: 31.4 g/dL — ABNORMAL LOW (ref 32.0–36.0)
MCV: 84.9 fL (ref 80.0–100.0)
PLATELETS: 336 10*3/uL (ref 150–440)
RBC: 3.16 MIL/uL — ABNORMAL LOW (ref 4.40–5.90)
RDW: 20.1 % — AB (ref 11.5–14.5)
WBC: 8 10*3/uL (ref 3.8–10.6)

## 2017-04-28 LAB — POTASSIUM: Potassium: 3.2 mmol/L — ABNORMAL LOW (ref 3.5–5.1)

## 2017-04-28 MED ORDER — ALTEPLASE 2 MG IJ SOLR: 2.0000 mg | Freq: Once | INTRAMUSCULAR | Status: DC | PRN

## 2017-04-28 MED ORDER — EPOETIN ALFA 10000 UNIT/ML IJ SOLN
10000.0000 [IU] | Freq: Once | INTRAMUSCULAR | Status: AC
Start: 2017-04-28 — End: 2017-04-28
  Administered 2017-04-28: 10000 [IU] via INTRAVENOUS
  Filled 2017-04-28: qty 1

## 2017-04-28 MED ORDER — ACETAMINOPHEN 325 MG PO TABS
650.0000 mg | ORAL_TABLET | Freq: Four times a day (QID) | ORAL | Status: AC | PRN
  Administered 2017-04-28: 650 mg via ORAL

## 2017-04-28 MED ORDER — DIPHENHYDRAMINE HCL 50 MG/ML IJ SOLN
50.0000 mg | Freq: Four times a day (QID) | INTRAMUSCULAR | Status: AC | PRN
  Administered 2017-04-28: 50 mg via INTRAVENOUS

## 2017-04-28 NOTE — Progress Notes (Signed)
Lab called reporting elevated potassium levels, 6.4. MD made aware.

## 2017-04-28 NOTE — ED Provider Notes (Addendum)
[facility] Emergency Department Provider Note  ____________________________________________   First MD Initiated Contact with Patient 04/28/17 [phone]     (approximate)  I have reviewed the triage vital signs and the nursing notes.   HISTORY  Chief Complaint Vascular Access Problem   HPI Stanley [name] is a 57 y.o. male with a history of cirrhosis as well as end-stage renal disease on dialysis was presented to the emergency department for his routine dialysis.  He has no complaints today.  Denies any shortness of breath or abdominal pain.   Past Medical History:  Diagnosis Date  . Alcohol abuse   . CHF (congestive heart failure) ([place])   . Cirrhosis ([place])   . Coronary artery disease 2009  . Diabetic peripheral neuropathy ([place])   . Drug abuse ([place])   . End stage renal disease on dialysis [facility]) NEPHROLOGIST-   DR [facility]  IN [place]   HEMODIALYSIS --   TUES/  THURS/  SAT  . GERD (gastroesophageal reflux disease)   . Hyperlipidemia   . Hypertension   . PAD (peripheral artery disease) ([place])   . Renal insufficiency    Per pt, 32 oz fluid restriction per day  . S/P triple vessel bypass 06/09/2016   2009ish  . Suicidal ideation    & HOMICIDAL IDEATION --  06-16-2013   ADMITTED TO BEHAVIOR HEALTH    Patient Active Problem List   Diagnosis Date Noted  . Uremia 03/08/2017  . Hemodialysis patient ([place]) 03/03/2017  . Weakness 02/28/2017  . Hypocalcemia 02/22/2017  . Shortness of breath 11/26/2016  . COPD (chronic obstructive pulmonary disease) ([place]) 10/30/2016  . COPD exacerbation ([place]) 10/29/2016  . Anemia   . Heme positive stool   . Ulceration of intestine   . Benign neoplasm of transverse colon   . Acute gastrointestinal hemorrhage   . Esophageal candidiasis ([place])   . Angiodysplasia of intestinal tract   . Acute respiratory failure with hypoxia ([place]) 07/03/2016  . GI bleeding 06/24/2016  . Rectal bleeding 06/14/2016  . Anemia of chronic  disease 06/01/2016  . MRSA carrier 06/01/2016  . Chronic renal failure 05/23/2016  . Ischemic heart disease 05/23/2016  . Angiodysplasia of small intestine   . Melena   . Small bowel bleed not requiring more than 4 units of blood in 24 hours, ICU, or surgery   . Anemia due to chronic blood loss   . Abdominal pain 05/05/2016  . Acute posthemorrhagic anemia 04/17/2016  . Gastrointestinal bleed 04/17/2016  . History of esophagogastroduodenoscopy (EGD) 04/17/2016  . Elevated troponin 04/17/2016  . Alcohol abuse 04/17/2016  . Upper GI bleed 01/19/2016  . Blood in stool   . Angiodysplasia of stomach and duodenum with hemorrhage   . Gastritis   . Esophagitis, unspecified   . GI bleed 05/16/2015  . Acute GI bleeding   . Symptomatic anemia 04/30/2015  . HTN (hypertension) 04/06/2015  . GERD (gastroesophageal reflux disease) 04/06/2015  . HLD (hyperlipidemia) 04/06/2015  . Dyspnea 04/06/2015  . Cirrhosis of liver with ascites ([place]) 04/06/2015  . Ascites 04/06/2015  . GIB (gastrointestinal bleeding) 03/23/2015  . Homicidal ideation 06/19/2013  . Suicidal intent 06/19/2013  . Homicidal ideations 06/19/2013  . Hyperkalemia 06/16/2013  . Mandible fracture ([place]) 06/05/2013  . Fracture, mandible ([place]) 06/02/2013  . Coronary atherosclerosis of native coronary artery 06/02/2013  . ESRD on dialysis ([place]) 06/02/2013  . Mandible open fracture ([place]) 06/02/2013    Past Surgical History:  Procedure Laterality Date  .  AGILE CAPSULE N/A 06/19/2016   Procedure: AGILE CAPSULE;  Surgeon: [name], MD;  Location: [facility] ENDOSCOPY;  Service: Endoscopy;  Laterality: N/A;  . COLONOSCOPY WITH PROPOFOL N/A 06/18/2016   Procedure: COLONOSCOPY WITH PROPOFOL;  Surgeon: [name], MD;  Location: [facility] ENDOSCOPY;  Service: Endoscopy;  Laterality: N/A;  . COLONOSCOPY WITH PROPOFOL N/A 08/12/2016   Procedure: COLONOSCOPY WITH PROPOFOL;  Surgeon: [name], MD;  Location: [facility] ENDOSCOPY;  Service: Endoscopy;   Laterality: N/A;  . CORONARY ANGIOPLASTY  ?   PT UNABLE TO TELL IF  BEFORE OR AFTER  CABG  . CORONARY ARTERY BYPASS GRAFT  2008  ([place] , [place])   3 VESSEL  . DIALYSIS FISTULA CREATION  LAST SURGERY  APPOX  2008  . ENTEROSCOPY N/A 05/10/2016   Procedure: ENTEROSCOPY;  Surgeon: Jerene Bears, MD;  Location: [place];  Service: Gastroenterology;  Laterality: N/A;  . ENTEROSCOPY N/A 08/12/2016   Procedure: ENTEROSCOPY;  Surgeon: [name], MD;  Location: [facility] ENDOSCOPY;  Service: Endoscopy;  Laterality: N/A;  . ESOPHAGOGASTRODUODENOSCOPY N/A 05/07/2015   Procedure: ESOPHAGOGASTRODUODENOSCOPY (EGD);  Surgeon: Hulen Luster, MD;  Location: [facility] ENDOSCOPY;  Service: Endoscopy;  Laterality: N/A;  . ESOPHAGOGASTRODUODENOSCOPY (EGD) WITH PROPOFOL N/A 05/17/2015   Procedure: ESOPHAGOGASTRODUODENOSCOPY (EGD) WITH PROPOFOL;  Surgeon: [name], MD;  Location: [facility] ENDOSCOPY;  Service: Endoscopy;  Laterality: N/A;  . ESOPHAGOGASTRODUODENOSCOPY (EGD) WITH PROPOFOL N/A 01/20/2016   Procedure: ESOPHAGOGASTRODUODENOSCOPY (EGD) WITH PROPOFOL;  Surgeon: [name], MD;  Location: [facility] ENDOSCOPY;  Service: Endoscopy;  Laterality: N/A;  . ESOPHAGOGASTRODUODENOSCOPY (EGD) WITH PROPOFOL N/A 04/17/2016   Procedure: ESOPHAGOGASTRODUODENOSCOPY (EGD) WITH PROPOFOL;  Surgeon: Lin Landsman, MD;  Location: [facility] ENDOSCOPY;  Service: Gastroenterology;  Laterality: N/A;  . ESOPHAGOGASTRODUODENOSCOPY (EGD) WITH PROPOFOL  05/09/2016   Procedure: ESOPHAGOGASTRODUODENOSCOPY (EGD) WITH PROPOFOL;  Surgeon: Jerene Bears, MD;  Location: [place];  Service: Endoscopy;;  . ESOPHAGOGASTRODUODENOSCOPY (EGD) WITH PROPOFOL N/A 06/16/2016   Procedure: ESOPHAGOGASTRODUODENOSCOPY (EGD) WITH PROPOFOL;  Surgeon: [name], MD;  Location: [facility] ENDOSCOPY;  Service: Endoscopy;  Laterality: N/A;  . GIVENS CAPSULE STUDY N/A 05/07/2016   Procedure: GIVENS CAPSULE STUDY;  Surgeon: Doran Stabler, MD;  Location: [place];  Service: Endoscopy;   Laterality: N/A;  . MANDIBULAR HARDWARE REMOVAL N/A 07/29/2013   Procedure: REMOVAL OF ARCH BARS;  Surgeon: Theodoro Kos, DO;  Location: [place];  Service: Plastics;  Laterality: N/A;  . ORIF MANDIBULAR FRACTURE N/A 06/05/2013   Procedure: REPAIR OF MANDIBULAR FRACTURE x 2 with maxillo-mandibular fixation ;  Surgeon: Theodoro Kos, DO;  Location: [place];  Service: Plastics;  Laterality: N/A;  . PERIPHERAL ARTERIAL STENT GRAFT Left     Prior to Admission medications   Medication Sig Start Date End Date Taking? Authorizing Provider  albuterol (PROVENTIL HFA;VENTOLIN HFA) 108 (90 Base) MCG/ACT inhaler Inhale 2 puffs into the lungs every 6 (six) hours as needed for wheezing or shortness of breath. 04/21/17   Lisa Roca, MD  alum & mag hydroxide-simeth (MAALOX/MYLANTA) 200-200-20 MG/5ML suspension Take 15 mLs every 4 (four) hours as needed by mouth for indigestion or heartburn. Patient not taking: Reported on 04/19/2017 01/25/17   Demetrios Loll, MD  atorvastatin (LIPITOR) 40 MG tablet Take 1 tablet (40 mg total) by mouth daily. 11/16/16   Vaughan Basta, MD  budesonide-formoterol (SYMBICORT) 160-4.5 MCG/ACT inhaler Inhale 2 puffs into the lungs daily. 11/16/16   Vaughan Basta, MD  calcium acetate (PHOSLO) 667 MG capsule Take 2,001 mg by mouth 3 (three) times  daily.    [provider]  ferrous sulfate 325 (65 FE) MG tablet Take 1 tablet by mouth 3 (three) times daily. 01/21/17   [provider]  furosemide (LASIX) 80 MG tablet Take 1 tablet (80 mg total) daily by mouth. 01/25/17   Demetrios Loll, MD  gabapentin (NEURONTIN) 300 MG capsule Take 1 capsule by mouth daily as directed 08/16/16   [provider]  ipratropium-albuterol (DUONEB) 0.5-2.5 (3) MG/3ML SOLN Take 3 mLs by nebulization every 6 (six) hours as needed. 11/28/16   Loletha Grayer, MD  labetalol (NORMODYNE) 100 MG tablet Take 100 mg by mouth daily. 03/24/17   [provider]    lactulose (CHRONULAC) 10 GM/15ML solution Take 30 mLs (20 g total) by mouth 3 (three) times daily. Patient not taking: Reported on 04/05/2017 01/11/17   Bettey Costa, MD  Multiple Vitamins-Minerals-FA (DIALYVITE SUPREME D) 3 MG TABS Take 1 tablet (3 mg total) by mouth daily. 11/28/16   Loletha Grayer, MD  nitroGLYCERIN (NITROSTAT) 0.4 MG SL tablet Place 1 tablet (0.4 mg total) under the tongue every 5 (five) minutes as needed. 04/12/16   Wende Bushy, MD  pantoprazole (PROTONIX) 40 MG tablet Take 1 tablet (40 mg total) by mouth daily. 11/28/16   Loletha Grayer, MD  Spacer/Aero Chamber Mouthpiece MISC 1 Units by Does not apply route every 4 (four) hours as needed (wheezing). 02/19/17   Darel Hong, MD  tiotropium (SPIRIVA HANDIHALER) 18 MCG inhalation capsule Place 1 capsule (18 mcg total) into inhaler and inhale daily. 11/16/16 11/16/17  Vaughan Basta, MD    Allergies Patient has no known allergies.  Family History  Problem Relation Age of Onset  . Colon cancer Mother   . Cancer Father   . Cancer Sister   . Kidney disease Brother     Social History Social History   Tobacco Use  . Smoking status: Current Every Day Smoker    Packs/day: 0.15    Years: 40.00    Pack years: 6.00    Types: Cigarettes  . Smokeless tobacco: Never Used  Substance Use Topics  . Alcohol use: No    Comment: pt reports quitting after learning about cirrhosis  . Drug use: No    Review of Systems  Constitutional: No fever/chills Eyes: No visual changes. ENT: No sore throat. Cardiovascular: Denies chest pain. Respiratory: Denies shortness of breath. Gastrointestinal: No abdominal pain.  No nausea, no vomiting.  No diarrhea.  No constipation. Genitourinary: Negative for dysuria. Musculoskeletal: Negative for back pain. Skin: Negative for rash. Neurological: Negative for headaches, focal weakness or numbness.   ____________________________________________   PHYSICAL EXAM:  VITAL  SIGNS: ED Triage Vitals  Enc Vitals Group     BP 04/28/17 0838 112/75     Pulse Rate 04/28/17 0838 87     Resp 04/28/17 0838 18     Temp 04/28/17 0838 98.2 F (36.8 C)     Temp Source 04/28/17 0838 Oral     SpO2 04/28/17 0838 96 %     Weight 04/28/17 0840 159 lb (72.1 kg)     Height 04/28/17 0840 6\' 3"  (1.905 m)     Head Circumference --      Peak Flow --      Pain Score --      Pain Loc --      Pain Edu? --      Excl. in [place]? --     Constitutional: Alert and oriented. Well appearing and in no acute distress. Eyes:  Conjunctivae are normal.  Head: Atraumatic. Nose: No congestion/rhinnorhea. Mouth/Throat: Mucous membranes are moist.  Neck: No stridor.   Cardiovascular: Normal rate, regular rhythm. Grossly normal heart sounds.  Palpable thrill to the dialysis fistula. Respiratory: Normal respiratory effort.  No retractions. Lungs CTAB. Gastrointestinal: Soft and nontender. No distention.  Musculoskeletal: Mild edema to the bilateral lower extremity's.  No joint effusions. Neurologic:  Normal speech and language. No gross focal neurologic deficits are appreciated. Skin:  Skin is warm, dry and intact. No rash noted. Psychiatric: Mood and affect are normal. Speech and behavior are normal.  ____________________________________________   LABS (all labs ordered are listed, but only abnormal results are displayed)  Labs Reviewed - No data to display ____________________________________________  EKG   ____________________________________________  RADIOLOGY   ____________________________________________   PROCEDURES  Procedure(s) performed:   Procedures  Critical Care performed:   ____________________________________________   INITIAL IMPRESSION / ASSESSMENT AND PLAN / ED COURSE  Pertinent labs & imaging results that were available during my care of the patient were reviewed by me and considered in my medical decision making (see chart for details).  DDX:  Chronic kidney disease, hyperkalemia, electrolyte abnormality As part of my medical decision making, I reviewed the following data within the [facility] Notes from prior ED visits .    Patient taken directly to dialysis.  ____________________________________________   FINAL CLINICAL IMPRESSION(S) / ED DIAGNOSES  End-stage renal disease.    NEW MEDICATIONS STARTED DURING THIS VISIT:  New Prescriptions   No medications on file     Note:  This document was prepared using Dragon voice recognition software and may include unintentional dictation errors.     Marcellene Shivley, Randall An, MD 04/28/17 [phone]  Patient has been dialyzed and is without any acute distress right now.  No complaints.  Patient will be discharged at this time.      Orbie Pyo, MD 04/28/17 [phone]

## 2017-04-28 NOTE — ED Triage Notes (Signed)
Patient is here for dialysis. M,W,F. Last received peritoneal dialysis on Tuesday

## 2017-04-28 NOTE — ED Notes (Signed)
Taken to dialysis by toya RN

## 2017-04-28 NOTE — Progress Notes (Signed)
Pre HD Assessment  

## 2017-04-28 NOTE — Progress Notes (Signed)
Post HD assessment. Pt tolerated tx well, no c/o or complications. Net UF 2664 ml. Post HD potassium taken per MD orders.

## 2017-04-28 NOTE — ED Notes (Signed)
Pt here for dialysis. No complaints. Received Tuesday last.

## 2017-04-28 NOTE — Progress Notes (Signed)
[place], [place] 04/28/17  Subjective:  Patient seen and evaluated during HD. Most recent serum K 5.2.   Phos was high at 7.7.   Objective:  Vital signs in last 24 hours:  Temp:  [98.2 F (36.8 C)] 98.2 F (36.8 C) (02/15 0838) Pulse Rate:  [87] 87 (02/15 0838) Resp:  [18] 18 (02/15 0838) BP: (112)/(75) 112/75 (02/15 0838) SpO2:  [96 %] 96 % (02/15 0838) Weight:  [72.1 kg (159 lb)] 72.1 kg (159 lb) (02/15 0840)  Weight change:  Filed Weights   04/28/17 0840  Weight: 72.1 kg (159 lb)    Intake/Output:   No intake or output data in the 24 hours ending 04/28/17 1029   Physical Exam: General: NAD laying in bed  HEENT Moist oral mucus membranes  Neck supple  Pulm/lungs Clear bilateral  CVS/Heart regular  Abdomen:  Soft, distended from ascites  Extremities: Trace  Neurologic: Alert, oriented  Skin: No acute rashes  Access: Right arm AV graft       Basic Metabolic Panel:  No results for input(s): NA, K, CL, CO2, GLUCOSE, BUN, CREATININE, CALCIUM, MG, PHOS in the last 168 hours.   CBC: No results for input(s): WBC, NEUTROABS, HGB, HCT, MCV, PLT in the last 168 hours.    Lab Results  Component Value Date   HEPBSAG Negative 03/01/2017   HEPBSAB Reactive 03/01/2017   HEPBIGM Negative 06/01/2016      Microbiology:  No results found for this or any previous visit (from the past 240 hour(s)).  Coagulation Studies: No results for input(s): LABPROT, INR in the last 72 hours.  Urinalysis: No results for input(s): COLORURINE, LABSPEC, PHURINE, GLUCOSEU, HGBUR, BILIRUBINUR, KETONESUR, PROTEINUR, UROBILINOGEN, NITRITE, LEUKOCYTESUR in the last 72 hours.  Invalid input(s): APPERANCEUR    Imaging: No results found.   Medications:       Assessment/ Plan:  57 y.o. male with end stage renal disease on hemodialysis secondary to Alport's syndrome, ascites, hypertension, anemia of chronic kidney disease, coronary artery  disease, peripheral vascular disease, hyperlipidemia, gastrointestinal AVMs, echo in April 2018-> severe pulmonary hypertension, LVH, EF 50-55% moderate to severe mitral regurgitation, left atrial dilation, moderate tricuspid regurgitation likely leading to congestive hepatic insufficiency   [facility] [place] AVF- no outpatient dialysis unit currently  1. Uremia 2. Anemia of chronic kidney disease 3. Secondary Hyperparathyroidism 4. Ascites 5. Hyperkalemia 6. ESRD  - We plan to check renal function panel to make sure K is within acceptable range, last K was 5.2.  Also check phosphorus which was high previously.  He received paracentesis earlier this week also which he tolerated well.  Continue to monitor his status.      LOS: 0 Denali Sharma 2/15/201910:29 AM  [facility] [place], [place]

## 2017-04-28 NOTE — Progress Notes (Signed)
MD ordered low K bath for duration of TX , pt K is elevated, will redraw post treatment.

## 2017-04-28 NOTE — Progress Notes (Signed)
HD tx end  

## 2017-04-28 NOTE — Progress Notes (Signed)
Pre HD TX,

## 2017-04-28 NOTE — Progress Notes (Signed)
Post HD assessment  

## 2017-05-01 ENCOUNTER — Emergency Department
Admission: EM | Admit: 2017-05-01 | Discharge: 2017-05-01 | Disposition: A | Discharge status: Home / Self Care | Payer: Medicare Other

## 2017-05-01 ENCOUNTER — Encounter: Payer: Self-pay | Admitting: Emergency Medicine

## 2017-05-01 ENCOUNTER — Other Ambulatory Visit: Payer: Self-pay

## 2017-05-01 NOTE — ED Notes (Signed)
Pt approached this RN to let us know that he was leaving. Pt states he called the dialysis nurse and they told him it was going to be a five hour wait for him to have his treatment. Pt has decided not to stay and wait. Notified this RN that he was leaving.

## 2017-05-01 NOTE — ED Triage Notes (Signed)
Pt here for his dialysis treatment that we provide for him as out patient. NAD.

## 2017-05-03 ENCOUNTER — Other Ambulatory Visit: Payer: Self-pay

## 2017-05-03 ENCOUNTER — Emergency Department: Payer: Medicare Other

## 2017-05-03 ENCOUNTER — Inpatient Hospital Stay
Admission: EM | Admit: 2017-05-03 | Discharge: 2017-05-05 | DRG: 377 | Discharge status: Against Medical Advice | Payer: Medicare Other | Attending: Internal Medicine | Admitting: Internal Medicine

## 2017-05-03 DIAGNOSIS — K7460 Unspecified cirrhosis of liver: Secondary | ICD-10-CM | POA: Diagnosis not present

## 2017-05-03 DIAGNOSIS — D123 Benign neoplasm of transverse colon: Secondary | ICD-10-CM | POA: Diagnosis not present

## 2017-05-03 DIAGNOSIS — I509 Heart failure, unspecified: Secondary | ICD-10-CM | POA: Diagnosis not present

## 2017-05-03 DIAGNOSIS — I5032 Chronic diastolic (congestive) heart failure: Secondary | ICD-10-CM | POA: Diagnosis present

## 2017-05-03 DIAGNOSIS — N2581 Secondary hyperparathyroidism of renal origin: Secondary | ICD-10-CM | POA: Diagnosis present

## 2017-05-03 DIAGNOSIS — E875 Hyperkalemia: Secondary | ICD-10-CM | POA: Diagnosis present

## 2017-05-03 DIAGNOSIS — I2510 Atherosclerotic heart disease of native coronary artery without angina pectoris: Secondary | ICD-10-CM | POA: Diagnosis present

## 2017-05-03 DIAGNOSIS — K210 Gastro-esophageal reflux disease with esophagitis: Secondary | ICD-10-CM | POA: Diagnosis present

## 2017-05-03 DIAGNOSIS — K625 Hemorrhage of anus and rectum: Secondary | ICD-10-CM | POA: Diagnosis not present

## 2017-05-03 DIAGNOSIS — Z9115 Patient's noncompliance with renal dialysis: Secondary | ICD-10-CM

## 2017-05-03 DIAGNOSIS — Z951 Presence of aortocoronary bypass graft: Secondary | ICD-10-CM | POA: Diagnosis not present

## 2017-05-03 DIAGNOSIS — K2270 Barrett's esophagus without dysplasia: Secondary | ICD-10-CM | POA: Diagnosis not present

## 2017-05-03 DIAGNOSIS — K449 Diaphragmatic hernia without obstruction or gangrene: Secondary | ICD-10-CM | POA: Diagnosis present

## 2017-05-03 DIAGNOSIS — I132 Hypertensive heart and chronic kidney disease with heart failure and with stage 5 chronic kidney disease, or end stage renal disease: Secondary | ICD-10-CM | POA: Diagnosis present

## 2017-05-03 DIAGNOSIS — K920 Hematemesis: Principal | ICD-10-CM | POA: Diagnosis present

## 2017-05-03 DIAGNOSIS — I120 Hypertensive chronic kidney disease with stage 5 chronic kidney disease or end stage renal disease: Secondary | ICD-10-CM | POA: Diagnosis not present

## 2017-05-03 DIAGNOSIS — D649 Anemia, unspecified: Secondary | ICD-10-CM | POA: Diagnosis not present

## 2017-05-03 DIAGNOSIS — N186 End stage renal disease: Secondary | ICD-10-CM | POA: Diagnosis present

## 2017-05-03 DIAGNOSIS — G8929 Other chronic pain: Secondary | ICD-10-CM | POA: Diagnosis present

## 2017-05-03 DIAGNOSIS — E1122 Type 2 diabetes mellitus with diabetic chronic kidney disease: Secondary | ICD-10-CM | POA: Diagnosis present

## 2017-05-03 DIAGNOSIS — F17210 Nicotine dependence, cigarettes, uncomplicated: Secondary | ICD-10-CM | POA: Diagnosis present

## 2017-05-03 DIAGNOSIS — R188 Other ascites: Secondary | ICD-10-CM | POA: Diagnosis not present

## 2017-05-03 DIAGNOSIS — I8500 Esophageal varices without bleeding: Secondary | ICD-10-CM | POA: Diagnosis present

## 2017-05-03 DIAGNOSIS — K7031 Alcoholic cirrhosis of liver with ascites: Secondary | ICD-10-CM | POA: Diagnosis present

## 2017-05-03 DIAGNOSIS — D122 Benign neoplasm of ascending colon: Secondary | ICD-10-CM | POA: Diagnosis not present

## 2017-05-03 DIAGNOSIS — K219 Gastro-esophageal reflux disease without esophagitis: Secondary | ICD-10-CM | POA: Diagnosis not present

## 2017-05-03 DIAGNOSIS — R049 Hemorrhage from respiratory passages, unspecified: Secondary | ICD-10-CM | POA: Diagnosis not present

## 2017-05-03 DIAGNOSIS — J449 Chronic obstructive pulmonary disease, unspecified: Secondary | ICD-10-CM | POA: Diagnosis present

## 2017-05-03 DIAGNOSIS — D125 Benign neoplasm of sigmoid colon: Secondary | ICD-10-CM | POA: Diagnosis not present

## 2017-05-03 DIAGNOSIS — K648 Other hemorrhoids: Secondary | ICD-10-CM | POA: Diagnosis not present

## 2017-05-03 DIAGNOSIS — Z992 Dependence on renal dialysis: Secondary | ICD-10-CM

## 2017-05-03 DIAGNOSIS — D62 Acute posthemorrhagic anemia: Secondary | ICD-10-CM | POA: Diagnosis present

## 2017-05-03 DIAGNOSIS — E785 Hyperlipidemia, unspecified: Secondary | ICD-10-CM | POA: Diagnosis present

## 2017-05-03 DIAGNOSIS — K922 Gastrointestinal hemorrhage, unspecified: Secondary | ICD-10-CM | POA: Diagnosis not present

## 2017-05-03 DIAGNOSIS — E1142 Type 2 diabetes mellitus with diabetic polyneuropathy: Secondary | ICD-10-CM | POA: Diagnosis present

## 2017-05-03 DIAGNOSIS — Z538 Procedure and treatment not carried out for other reasons: Secondary | ICD-10-CM | POA: Diagnosis not present

## 2017-05-03 DIAGNOSIS — D631 Anemia in chronic kidney disease: Secondary | ICD-10-CM | POA: Diagnosis present

## 2017-05-03 DIAGNOSIS — E1151 Type 2 diabetes mellitus with diabetic peripheral angiopathy without gangrene: Secondary | ICD-10-CM | POA: Diagnosis present

## 2017-05-03 DIAGNOSIS — K635 Polyp of colon: Secondary | ICD-10-CM | POA: Diagnosis not present

## 2017-05-03 DIAGNOSIS — K209 Esophagitis, unspecified: Secondary | ICD-10-CM | POA: Diagnosis not present

## 2017-05-03 LAB — COMPREHENSIVE METABOLIC PANEL
ALBUMIN: 2.5 g/dL — AB (ref 3.5–5.0)
ALT: 18 U/L (ref 17–63)
AST: 23 U/L (ref 15–41)
Alkaline Phosphatase: 129 U/L — ABNORMAL HIGH (ref 38–126)
Anion gap: 14 (ref 5–15)
BUN: 92 mg/dL — ABNORMAL HIGH (ref 6–20)
CHLORIDE: 92 mmol/L — AB (ref 101–111)
CO2: 33 mmol/L — AB (ref 22–32)
CREATININE: 11.2 mg/dL — AB (ref 0.61–1.24)
Calcium: 8.3 mg/dL — ABNORMAL LOW (ref 8.9–10.3)
GFR calc non Af Amer: 4 mL/min — ABNORMAL LOW (ref >60)
GFR, EST AFRICAN AMERICAN: 5 mL/min — AB (ref >60)
Glucose, Bld: 97 mg/dL (ref 65–99)
Potassium: 7.5 mmol/L (ref 3.5–5.1)
SODIUM: 139 mmol/L (ref 135–145)
Total Bilirubin: 0.6 mg/dL (ref 0.3–1.2)
Total Protein: 6.2 g/dL — ABNORMAL LOW (ref 6.5–8.1)

## 2017-05-03 LAB — PREPARE RBC (CROSSMATCH)

## 2017-05-03 LAB — PROTIME-INR
INR: 1.14
Prothrombin Time: 14.5 seconds (ref 11.4–15.2)

## 2017-05-03 LAB — CBC WITH DIFFERENTIAL/PLATELET
Basophils Absolute: 0.1 10*3/uL (ref 0–0.1)
Basophils Relative: 1 %
Eosinophils Absolute: 0.1 10*3/uL (ref 0–0.7)
Eosinophils Relative: 1 %
HEMATOCRIT: 20.5 % — AB (ref 40.0–52.0)
HEMOGLOBIN: 6.3 g/dL — AB (ref 13.0–18.0)
LYMPHS ABS: 0.8 10*3/uL — AB (ref 1.0–3.6)
Lymphocytes Relative: 11 %
MCH: 26.6 pg (ref 26.0–34.0)
MCHC: 30.7 g/dL — ABNORMAL LOW (ref 32.0–36.0)
MCV: 86.4 fL (ref 80.0–100.0)
Monocytes Absolute: 0.8 10*3/uL (ref 0.2–1.0)
Monocytes Relative: 11 %
NEUTROS ABS: 5.8 10*3/uL (ref 1.4–6.5)
NEUTROS PCT: 76 %
Platelets: 280 10*3/uL (ref 150–440)
RBC: 2.37 MIL/uL — ABNORMAL LOW (ref 4.40–5.90)
RDW: 21.1 % — ABNORMAL HIGH (ref 11.5–14.5)
WBC: 7.6 10*3/uL (ref 3.8–10.6)

## 2017-05-03 LAB — HEMOGLOBIN: HEMOGLOBIN: 5.8 g/dL — AB (ref 13.0–18.0)

## 2017-05-03 LAB — TROPONIN I: Troponin I: 0.06 ng/mL (ref <0.03)

## 2017-05-03 LAB — MRSA PCR SCREENING: MRSA BY PCR: NEGATIVE

## 2017-05-03 LAB — POTASSIUM: Potassium: 5.3 mmol/L — ABNORMAL HIGH (ref 3.5–5.1)

## 2017-05-03 MED ORDER — CALCIUM ACETATE (PHOS BINDER) 667 MG PO CAPS
2001.0000 mg | ORAL_CAPSULE | Freq: Three times a day (TID) | ORAL | Status: DC
  Administered 2017-05-03 – 2017-05-05 (×6): 2001 mg via ORAL
  Filled 2017-05-03 (×6): qty 3

## 2017-05-03 MED ORDER — LABETALOL HCL 100 MG PO TABS
100.0000 mg | ORAL_TABLET | Freq: Every day | ORAL | Status: DC
  Administered 2017-05-03 – 2017-05-05 (×3): 100 mg via ORAL
  Filled 2017-05-03 (×3): qty 1

## 2017-05-03 MED ORDER — FERROUS SULFATE 325 (65 FE) MG PO TABS
325.0000 mg | ORAL_TABLET | Freq: Three times a day (TID) | ORAL | Status: DC
  Administered 2017-05-03 – 2017-05-05 (×5): 325 mg via ORAL
  Filled 2017-05-03 (×5): qty 1

## 2017-05-03 MED ORDER — ACETAMINOPHEN 325 MG PO TABS
650.0000 mg | ORAL_TABLET | Freq: Once | ORAL | Status: AC
  Administered 2017-05-03: 650 mg via ORAL
  Filled 2017-05-03: qty 2

## 2017-05-03 MED ORDER — TIOTROPIUM BROMIDE MONOHYDRATE 18 MCG IN CAPS
18.0000 ug | ORAL_CAPSULE | Freq: Every day | RESPIRATORY_TRACT | Status: DC
  Administered 2017-05-03 – 2017-05-05 (×3): 18 ug via RESPIRATORY_TRACT
  Filled 2017-05-03: qty 5

## 2017-05-03 MED ORDER — ALBUTEROL SULFATE (2.5 MG/3ML) 0.083% IN NEBU
2.5000 mg | INHALATION_SOLUTION | Freq: Once | RESPIRATORY_TRACT | Status: AC
  Administered 2017-05-03: 2.5 mg via RESPIRATORY_TRACT
  Filled 2017-05-03: qty 3

## 2017-05-03 MED ORDER — FUROSEMIDE 10 MG/ML IJ SOLN
80.0000 mg | Freq: Once | INTRAMUSCULAR | Status: AC
  Administered 2017-05-03: 80 mg via INTRAVENOUS
  Filled 2017-05-03: qty 8

## 2017-05-03 MED ORDER — POLYETHYLENE GLYCOL 3350 17 G PO PACK: 17.0000 g | PACK | Freq: Every day | ORAL | Status: DC | PRN

## 2017-05-03 MED ORDER — SODIUM CHLORIDE 0.9 % IV SOLN
Freq: Once | INTRAVENOUS | Status: AC
  Administered 2017-05-03: 22:00:00 via INTRAVENOUS

## 2017-05-03 MED ORDER — PANTOPRAZOLE SODIUM 40 MG IV SOLR
40.0000 mg | Freq: Two times a day (BID) | INTRAVENOUS | Status: DC
  Administered 2017-05-03 – 2017-05-05 (×3): 40 mg via INTRAVENOUS
  Filled 2017-05-03 (×4): qty 40

## 2017-05-03 MED ORDER — SODIUM CHLORIDE 0.9 % IV SOLN: 250.0000 mL | INTRAVENOUS | Status: DC | PRN

## 2017-05-03 MED ORDER — ONDANSETRON HCL 4 MG/2ML IJ SOLN: 4.0000 mg | Freq: Four times a day (QID) | INTRAMUSCULAR | Status: DC | PRN

## 2017-05-03 MED ORDER — SODIUM CHLORIDE 0.9% FLUSH
3.0000 mL | Freq: Two times a day (BID) | INTRAVENOUS | Status: DC
  Administered 2017-05-03 – 2017-05-05 (×4): 3 mL via INTRAVENOUS

## 2017-05-03 MED ORDER — SODIUM CHLORIDE 0.9 % IV SOLN
50.0000 ug/h | INTRAVENOUS | Status: DC
  Administered 2017-05-03 – 2017-05-04 (×3): 50 ug/h via INTRAVENOUS
  Filled 2017-05-03 (×7): qty 1

## 2017-05-03 MED ORDER — SODIUM CHLORIDE 0.9 % IV SOLN
1.0000 g | Freq: Once | INTRAVENOUS | Status: DC
  Filled 2017-05-03: qty 10

## 2017-05-03 MED ORDER — FUROSEMIDE 40 MG PO TABS
80.0000 mg | ORAL_TABLET | Freq: Every day | ORAL | Status: DC
  Administered 2017-05-03 – 2017-05-04 (×2): 80 mg via ORAL
  Filled 2017-05-03 (×2): qty 2

## 2017-05-03 MED ORDER — ALBUTEROL SULFATE (2.5 MG/3ML) 0.083% IN NEBU: 2.5000 mg | INHALATION_SOLUTION | Freq: Four times a day (QID) | RESPIRATORY_TRACT | Status: DC | PRN

## 2017-05-03 MED ORDER — RENA-VITE PO TABS
1.0000 | ORAL_TABLET | Freq: Every day | ORAL | Status: DC
  Administered 2017-05-03 – 2017-05-04 (×2): 1 via ORAL
  Filled 2017-05-03 (×2): qty 1

## 2017-05-03 MED ORDER — ALBUTEROL SULFATE HFA 108 (90 BASE) MCG/ACT IN AERS: 2.0000 | INHALATION_SPRAY | Freq: Four times a day (QID) | RESPIRATORY_TRACT | Status: DC | PRN

## 2017-05-03 MED ORDER — PANTOPRAZOLE SODIUM 40 MG IV SOLR
40.0000 mg | Freq: Once | INTRAVENOUS | Status: AC
  Administered 2017-05-03: 40 mg via INTRAVENOUS
  Filled 2017-05-03: qty 40

## 2017-05-03 MED ORDER — DIPHENHYDRAMINE HCL 50 MG/ML IJ SOLN
25.0000 mg | Freq: Four times a day (QID) | INTRAMUSCULAR | Status: DC | PRN
  Administered 2017-05-03: 25 mg via INTRAVENOUS
  Filled 2017-05-03: qty 1

## 2017-05-03 MED ORDER — INSULIN ASPART 100 UNIT/ML ~~LOC~~ SOLN
5.0000 [IU] | Freq: Once | SUBCUTANEOUS | Status: AC
  Administered 2017-05-03: 5 [IU] via INTRAVENOUS
  Filled 2017-05-03: qty 1

## 2017-05-03 MED ORDER — DIALYVITE SUPREME D 3 MG PO TABS: 1.0000 | ORAL_TABLET | Freq: Every day | ORAL | Status: DC

## 2017-05-03 MED ORDER — GABAPENTIN 300 MG PO CAPS
300.0000 mg | ORAL_CAPSULE | Freq: Two times a day (BID) | ORAL | Status: DC
  Administered 2017-05-03 – 2017-05-05 (×5): 300 mg via ORAL
  Filled 2017-05-03 (×5): qty 1

## 2017-05-03 MED ORDER — GUAIFENESIN 100 MG/5ML PO SOLN
5.0000 mL | ORAL | Status: DC | PRN
  Administered 2017-05-03: 100 mg via ORAL
  Filled 2017-05-03 (×3): qty 5

## 2017-05-03 MED ORDER — DEXTROSE 50 % IV SOLN
1.0000 | Freq: Once | INTRAVENOUS | Status: AC
  Administered 2017-05-03: 50 mL via INTRAVENOUS
  Filled 2017-05-03: qty 50

## 2017-05-03 MED ORDER — ATORVASTATIN CALCIUM 20 MG PO TABS
40.0000 mg | ORAL_TABLET | Freq: Every day | ORAL | Status: DC
  Administered 2017-05-03 – 2017-05-05 (×3): 40 mg via ORAL
  Filled 2017-05-03 (×3): qty 2

## 2017-05-03 MED ORDER — SODIUM BICARBONATE 8.4 % IV SOLN
50.0000 meq | Freq: Once | INTRAVENOUS | Status: AC
Start: 2017-05-03 — End: 2017-05-03
  Administered 2017-05-03: 50 meq via INTRAVENOUS
  Filled 2017-05-03: qty 50

## 2017-05-03 MED ORDER — ACETAMINOPHEN 325 MG PO TABS: 650.0000 mg | ORAL_TABLET | Freq: Four times a day (QID) | ORAL | Status: DC | PRN

## 2017-05-03 MED ORDER — ACETAMINOPHEN 650 MG RE SUPP: 650.0000 mg | Freq: Four times a day (QID) | RECTAL | Status: DC | PRN

## 2017-05-03 MED ORDER — OXYCODONE HCL 5 MG PO TABS
5.0000 mg | ORAL_TABLET | Freq: Four times a day (QID) | ORAL | Status: DC | PRN
  Administered 2017-05-03 – 2017-05-04 (×2): 5 mg via ORAL
  Filled 2017-05-03 (×2): qty 1

## 2017-05-03 MED ORDER — MOMETASONE FURO-FORMOTEROL FUM 200-5 MCG/ACT IN AERO
2.0000 | INHALATION_SPRAY | Freq: Two times a day (BID) | RESPIRATORY_TRACT | Status: DC
  Administered 2017-05-03 – 2017-05-05 (×4): 2 via RESPIRATORY_TRACT
  Filled 2017-05-03: qty 8.8

## 2017-05-03 MED ORDER — LACTULOSE 10 GM/15ML PO SOLN
20.0000 g | Freq: Three times a day (TID) | ORAL | Status: DC
  Administered 2017-05-03 (×2): 20 g via ORAL
  Filled 2017-05-03 (×3): qty 30

## 2017-05-03 MED ORDER — ONDANSETRON HCL 4 MG PO TABS: 4.0000 mg | ORAL_TABLET | Freq: Four times a day (QID) | ORAL | Status: DC | PRN

## 2017-05-03 NOTE — H&P (Signed)
[facility] [place] NAME: Stanley [name]    MR#:  [id number]  DATE OF BIRTH:  07-11-1960  DATE OF ADMISSION:  05/03/2017  PRIMARY CARE PHYSICIAN: [name], MD   REQUESTING/REFERRING PHYSICIAN: dr Quentin Cornwall  CHIEF COMPLAINT:   Vomiting blood HISTORY OF PRESENT ILLNESS:  Stanley [name]  is a 57 y.o. male with a known history of end-stage renal disease on hemodialysis, chronic diastolic heart failure, EtOH abuse with liver cirrhosis who presents to the emergency room with vomiting blood and dark color stools. He has chronic abdominal pain. He has been receiving dialysis by coming into the emergency room 3 times a week. He did not have dialysis on Monday he does he did not feel like coming.  PAST MEDICAL HISTORY:   Past Medical History:  Diagnosis Date  . Alcohol abuse   . CHF (congestive heart failure) ([place])   . Cirrhosis ([place])   . Coronary artery disease 2009  . Diabetic peripheral neuropathy ([place])   . Drug abuse ([place])   . End stage renal disease on dialysis [facility]) NEPHROLOGIST-   DR [facility]  IN [place]   HEMODIALYSIS --   TUES/  THURS/  SAT  . GERD (gastroesophageal reflux disease)   . Hyperlipidemia   . Hypertension   . PAD (peripheral artery disease) ([place])   . Renal insufficiency    Per pt, 32 oz fluid restriction per day  . S/P triple vessel bypass 06/09/2016   2009ish  . Suicidal ideation    & HOMICIDAL IDEATION --  06-16-2013   ADMITTED TO BEHAVIOR HEALTH    PAST SURGICAL HISTORY:   Past Surgical History:  Procedure Laterality Date  . AGILE CAPSULE N/A 06/19/2016   Procedure: AGILE CAPSULE;  Surgeon: [name], MD;  Location: [facility] ENDOSCOPY;  Service: Endoscopy;  Laterality: N/A;  . COLONOSCOPY WITH PROPOFOL N/A 06/18/2016   Procedure: COLONOSCOPY WITH PROPOFOL;  Surgeon: [name], MD;  Location: [facility] ENDOSCOPY;  Service: Endoscopy;  Laterality: N/A;  . COLONOSCOPY WITH PROPOFOL N/A 08/12/2016   Procedure:  COLONOSCOPY WITH PROPOFOL;  Surgeon: [name], MD;  Location: [facility] ENDOSCOPY;  Service: Endoscopy;  Laterality: N/A;  . CORONARY ANGIOPLASTY  ?   PT UNABLE TO TELL IF  BEFORE OR AFTER  CABG  . CORONARY ARTERY BYPASS GRAFT  2008  ([place] , [place])   3 VESSEL  . DIALYSIS FISTULA CREATION  LAST SURGERY  APPOX  2008  . ENTEROSCOPY N/A 05/10/2016   Procedure: ENTEROSCOPY;  Surgeon: Jerene Bears, MD;  Location: [place];  Service: Gastroenterology;  Laterality: N/A;  . ENTEROSCOPY N/A 08/12/2016   Procedure: ENTEROSCOPY;  Surgeon: [name], MD;  Location: [facility] ENDOSCOPY;  Service: Endoscopy;  Laterality: N/A;  . ESOPHAGOGASTRODUODENOSCOPY N/A 05/07/2015   Procedure: ESOPHAGOGASTRODUODENOSCOPY (EGD);  Surgeon: Hulen Luster, MD;  Location: [facility] ENDOSCOPY;  Service: Endoscopy;  Laterality: N/A;  . ESOPHAGOGASTRODUODENOSCOPY (EGD) WITH PROPOFOL N/A 05/17/2015   Procedure: ESOPHAGOGASTRODUODENOSCOPY (EGD) WITH PROPOFOL;  Surgeon: [name], MD;  Location: [facility] ENDOSCOPY;  Service: Endoscopy;  Laterality: N/A;  . ESOPHAGOGASTRODUODENOSCOPY (EGD) WITH PROPOFOL N/A 01/20/2016   Procedure: ESOPHAGOGASTRODUODENOSCOPY (EGD) WITH PROPOFOL;  Surgeon: [name], MD;  Location: [facility] ENDOSCOPY;  Service: Endoscopy;  Laterality: N/A;  . ESOPHAGOGASTRODUODENOSCOPY (EGD) WITH PROPOFOL N/A 04/17/2016   Procedure: ESOPHAGOGASTRODUODENOSCOPY (EGD) WITH PROPOFOL;  Surgeon: Lin Landsman, MD;  Location: [facility] ENDOSCOPY;  Service: Gastroenterology;  Laterality: N/A;  . ESOPHAGOGASTRODUODENOSCOPY (EGD) WITH PROPOFOL  05/09/2016   Procedure: ESOPHAGOGASTRODUODENOSCOPY (  EGD) WITH PROPOFOL;  Surgeon: Jerene Bears, MD;  Location: [place];  Service: Endoscopy;;  . ESOPHAGOGASTRODUODENOSCOPY (EGD) WITH PROPOFOL N/A 06/16/2016   Procedure: ESOPHAGOGASTRODUODENOSCOPY (EGD) WITH PROPOFOL;  Surgeon: [name], MD;  Location: [facility] ENDOSCOPY;  Service: Endoscopy;  Laterality: N/A;  . GIVENS CAPSULE STUDY N/A 05/07/2016    Procedure: GIVENS CAPSULE STUDY;  Surgeon: Doran Stabler, MD;  [facility];  Service: Endoscopy;  Laterality: N/A;  . MANDIBULAR HARDWARE REMOVAL N/A 07/29/2013   Procedure: REMOVAL OF ARCH BARS;  Surgeon: Theodoro Kos, DO;  Location: [place];  Service: Plastics;  Laterality: N/A;  . ORIF MANDIBULAR FRACTURE N/A 06/05/2013   Procedure: REPAIR OF MANDIBULAR FRACTURE x 2 with maxillo-mandibular fixation ;  Surgeon: Theodoro Kos, DO;  Location: [place];  Service: Plastics;  Laterality: N/A;  . PERIPHERAL ARTERIAL STENT GRAFT Left     SOCIAL HISTORY:   Social History   Tobacco Use  . Smoking status: Current Every Day Smoker    Packs/day: 0.15    Years: 40.00    Pack years: 6.00    Types: Cigarettes  . Smokeless tobacco: Never Used  Substance Use Topics  . Alcohol use: No    Comment: pt reports quitting after learning about cirrhosis    FAMILY HISTORY:   Family History  Problem Relation Age of Onset  . Colon cancer Mother   . Cancer Father   . Cancer Sister   . Kidney disease Brother     DRUG ALLERGIES:  No Known Allergies  REVIEW OF SYSTEMS:   Review of Systems  Constitutional: Negative.  Negative for chills, fever and malaise/fatigue.  HENT: Negative.  Negative for ear discharge, ear pain, hearing loss, nosebleeds and sore throat.   Eyes: Negative.  Negative for blurred vision and pain.  Respiratory: Negative.  Negative for cough, hemoptysis, shortness of breath and wheezing.   Cardiovascular: Negative.  Negative for chest pain, palpitations and leg swelling.  Gastrointestinal: Positive for vomiting. Negative for abdominal pain, blood in stool, diarrhea and nausea.       Vomiting blood  Genitourinary: Negative.  Negative for dysuria.  Musculoskeletal: Negative.  Negative for back pain.  Skin: Negative.   Neurological: Negative for dizziness, tremors, speech change, focal weakness, seizures and headaches.  Endo/Heme/Allergies: Negative.   Does not bruise/bleed easily.  Psychiatric/Behavioral: Negative.  Negative for depression, hallucinations and suicidal ideas.    MEDICATIONS AT HOME:   Prior to Admission medications   Medication Sig Start Date End Date Taking? Authorizing Provider  albuterol (PROVENTIL HFA;VENTOLIN HFA) 108 (90 Base) MCG/ACT inhaler Inhale 2 puffs into the lungs every 6 (six) hours as needed for wheezing or shortness of breath. 04/21/17  Yes Lisa Roca, MD  atorvastatin (LIPITOR) 40 MG tablet Take 1 tablet (40 mg total) by mouth daily. 11/16/16  Yes Vaughan Basta, MD  budesonide-formoterol [facility]) 160-4.5 MCG/ACT inhaler Inhale 2 puffs into the lungs daily. 11/16/16  Yes Vaughan Basta, MD  calcium acetate (PHOSLO) 667 MG capsule Take 2,001 mg by mouth 3 (three) times daily.   Yes [provider]  ferrous sulfate 325 (65 FE) MG tablet Take 1 tablet by mouth 3 (three) times daily. 01/21/17  Yes [provider]  furosemide (LASIX) 80 MG tablet Take 1 tablet (80 mg total) daily by mouth. 01/25/17  Yes Demetrios Loll, MD  gabapentin (NEURONTIN) 300 MG capsule Take 1 capsule by mouth daily as directed 08/16/16  Yes [provider]  ipratropium-albuterol (DUONEB) 0.5-2.5 (3)  MG/3ML SOLN Take 3 mLs by nebulization every 6 (six) hours as needed. 11/28/16  Yes Wieting, Richard, MD  labetalol (NORMODYNE) 100 MG tablet Take 100 mg by mouth daily. 03/24/17  Yes [provider]  Multiple Vitamins-Minerals-FA (DIALYVITE SUPREME D) 3 MG TABS Take 1 tablet (3 mg total) by mouth daily. 11/28/16  Yes Wieting, Richard, MD  nitroGLYCERIN (NITROSTAT) 0.4 MG SL tablet Place 1 tablet (0.4 mg total) under the tongue every 5 (five) minutes as needed. 04/12/16  Yes Wende Bushy, MD  pantoprazole (PROTONIX) 40 MG tablet Take 1 tablet (40 mg total) by mouth daily. 11/28/16  Yes Wieting, Richard, MD  tiotropium (SPIRIVA HANDIHALER) 18 MCG inhalation capsule Place 1 capsule (18 mcg total) into  inhaler and inhale daily. 11/16/16 11/16/17 Yes Vaughan Basta, MD  alum & mag hydroxide-simeth (MAALOX/MYLANTA) 200-200-20 MG/5ML suspension Take 15 mLs every 4 (four) hours as needed by mouth for indigestion or heartburn. Patient not taking: Reported on 04/19/2017 01/25/17   Demetrios Loll, MD  lactulose [facility]) 10 GM/15ML solution Take 30 mLs (20 g total) by mouth 3 (three) times daily. Patient not taking: Reported on 04/05/2017 01/11/17   Bettey Costa, MD  Spacer/Aero Chamber Mouthpiece MISC 1 Units by Does not apply route every 4 (four) hours as needed (wheezing). 02/19/17   Darel Hong, MD      VITAL SIGNS:  Pulse 91, temperature 98.2 F (36.8 C), temperature source Oral, resp. rate (!) 28, height 6\' 3"  (1.905 m), weight 72.1 kg (159 lb), SpO2 100 %.  PHYSICAL EXAMINATION:   Physical Exam  Constitutional: He is oriented to person, place, and time and well-developed, well-nourished, and in no distress. No distress.  HENT:  Head: Normocephalic.  Eyes: No scleral icterus.  Neck: Normal range of motion. Neck supple. No JVD present. No tracheal deviation present.  Cardiovascular: Normal rate, regular rhythm and normal heart sounds. Exam reveals no gallop and no friction rub.  No murmur heard. Pulmonary/Chest: Effort normal and breath sounds normal. No respiratory distress. He has no wheezes. He has no rales. He exhibits no tenderness.  Abdominal: Soft. Bowel sounds are normal. He exhibits no distension and no mass. There is no tenderness. There is no rebound and no guarding.  Musculoskeletal: Normal range of motion. He exhibits no edema.  Neurological: He is alert and oriented to person, place, and time.  Skin: Skin is warm. No rash noted. No erythema.  Psychiatric: Affect and judgment normal.      LABORATORY PANEL:   CBC Recent Labs  Lab 05/03/17 0803  WBC 7.6  HGB 6.3*  HCT 20.5*  PLT 280    ------------------------------------------------------------------------------------------------------------------  Chemistries  Recent Labs  Lab 05/03/17 0803  NA 139  K 7.5*  CL 92*  CO2 33*  GLUCOSE 97  BUN 92*  CREATININE 11.20*  CALCIUM 8.3*  AST 23  ALT 18  ALKPHOS 129*  BILITOT 0.6   ------------------------------------------------------------------------------------------------------------------  Cardiac Enzymes Recent Labs  Lab 05/03/17 0803  TROPONINI 0.06*   ------------------------------------------------------------------------------------------------------------------  RADIOLOGY:  Dg Chest 2 View  Result Date: 05/03/2017 CLINICAL DATA:  Hematemesis EXAM: CHEST  2 VIEW COMPARISON:  04/12/2017 FINDINGS: Cardiac shadow remains enlarged. Postsurgical changes are again seen. The lungs are well aerated bilaterally. No focal infiltrate or sizable effusion is seen. No bony abnormality is noted. IMPRESSION: No acute abnormality seen. Electronically Signed   By: Inez Catalina M.D.   On: 05/03/2017 07:17    EKG:   Orders placed or performed during the hospital encounter  of 04/12/17  . EKG 12-Lead  . EKG 12-Lead    IMPRESSION AND PLAN:   57 year old male with end-stage renal disease on hemodialysis, chronic diastolic heart failure and liver cirrhosis who presents with hematemesis.  1. Hematemesis: Patient will be started on IV PPI and octreotide Monitor hemoglobin GI consultation requested  2. Acute on chronic blood loss anemia: At this time we'll defer blood transfusion until after dialysis if needed. Continue ferrous sulfate 3. Hyperkalemia: Patient will be scheduled for emergent dialysis He has been noncompliant with dialysis sessions  4. Chronic diastolic heart failure without signs of exacerbation  5. History of liver cirrhosis: Continue Lasix  6. History of COPD: Continue inhalers  7. Hyperlipidemia: Continue statin  8. CAD/bypass: Continue  statin and labetalol  All the records are reviewed and case discussed with ED provider. Management plans discussed with the patient and he in agreement  CODE STATUS: full  TOTAL TIME TAKING CARE OF THIS PATIENT: 45 minutes.    Donta Mcinroy M.D on 05/03/2017 at 9:31 AM  Between 7am to 6pm - Pager - [phone]  After 6pm go to [email or web address] - password [email or web address] [place] [facility]  Office  [phone]  CC: Primary care physician; [name], MD

## 2017-05-03 NOTE — Progress Notes (Signed)
Pre HD  

## 2017-05-03 NOTE — ED Notes (Signed)
Date and time results received: 05/03/17 0912 (use smartphrase ".now" to insert current time)  Test: K Critical Value: 7.5  Name of Provider Notified: Quentin Cornwall

## 2017-05-03 NOTE — Progress Notes (Signed)
CRITICAL VALUE ALERT  Critical Value: 5.8   Date & Time Notied:  05/03/17 at 620 pm  Provider Notified: wieting  Orders Received/Actions taken: order one unit of blood

## 2017-05-03 NOTE — Progress Notes (Signed)
HD started. 

## 2017-05-03 NOTE — Progress Notes (Signed)
Spoke to Dr. Benjie Karvonen about pt's hemoglobin of 6.3. Per MD she is aware of it and pt will not be transfused at this time.

## 2017-05-03 NOTE — Progress Notes (Signed)
Patient complaining of abdominal pain 8/10. Notified Dr. Jannifer Franklin and he has ordered oxycodone prn.

## 2017-05-03 NOTE — ED Notes (Signed)
Pt transported to dialysis at this time.  Pt belongings (Clothing, Cell Phone, Keys) bagged and on back of stretcher at time of transfer.  Pat[place], Dialysis RN made aware.
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
## 2017-05-03 NOTE — ED Notes (Signed)
Two unsuccessful PIV attempts per this RN.

## 2017-05-03 NOTE — ED Notes (Signed)
Pharmacy notified to send Octreotide dose.

## 2017-05-03 NOTE — Progress Notes (Signed)
Per MD okay for RN to place diet order.  

## 2017-05-03 NOTE — ED Triage Notes (Signed)
Pt arrived via EMS from home with complaints of bright red blood in his vomit and urine. Pt reports that he has had this issue for years but he came in today because the blood "got thicker and brighter". Pt is a dialysis pt and usually has dialysis on Mondays, Wed, and Fri. Pt missed his Monday dialysis. Pt has a Hx of COPD. Pt has a fistula in his right arm. VS per EMS BP-124/82 HR-96 NSR R-24 O2sat-77%RA  Temp-98.2 oral. Pt came in on 3L nasal cannula. Pt wants to try removing the nasal cannula because it is uncomfortable. Pt O2sat- is 97% RA at this time. Pt alert and oriented x 4.

## 2017-05-04 LAB — BASIC METABOLIC PANEL
ANION GAP: 14 (ref 5–15)
BUN: 65 mg/dL — AB (ref 6–20)
CO2: 30 mmol/L (ref 22–32)
Calcium: 7.8 mg/dL — ABNORMAL LOW (ref 8.9–10.3)
Chloride: 97 mmol/L — ABNORMAL LOW (ref 101–111)
Creatinine, Ser: 8.26 mg/dL — ABNORMAL HIGH (ref 0.61–1.24)
GFR calc Af Amer: 7 mL/min — ABNORMAL LOW (ref >60)
GFR calc non Af Amer: 6 mL/min — ABNORMAL LOW (ref >60)
GLUCOSE: 105 mg/dL — AB (ref 65–99)
Potassium: 6.3 mmol/L (ref 3.5–5.1)
Sodium: 141 mmol/L (ref 135–145)

## 2017-05-04 LAB — CBC
HCT: 21.1 % — ABNORMAL LOW (ref 40.0–52.0)
Hemoglobin: 6.7 g/dL — ABNORMAL LOW (ref 13.0–18.0)
MCH: 27.6 pg (ref 26.0–34.0)
MCHC: 31.8 g/dL — ABNORMAL LOW (ref 32.0–36.0)
MCV: 86.8 fL (ref 80.0–100.0)
Platelets: 218 10*3/uL (ref 150–440)
RBC: 2.43 MIL/uL — ABNORMAL LOW (ref 4.40–5.90)
RDW: 19.9 % — AB (ref 11.5–14.5)
WBC: 6 10*3/uL (ref 3.8–10.6)

## 2017-05-04 LAB — POTASSIUM: POTASSIUM: 4.5 mmol/L (ref 3.5–5.1)

## 2017-05-04 LAB — HEMOGLOBIN
HEMOGLOBIN: 6.3 g/dL — AB (ref 13.0–18.0)
Hemoglobin: 6.7 g/dL — ABNORMAL LOW (ref 13.0–18.0)
Hemoglobin: 7.1 g/dL — ABNORMAL LOW (ref 13.0–18.0)

## 2017-05-04 LAB — PREPARE RBC (CROSSMATCH)

## 2017-05-04 LAB — HEMOGLOBIN AND HEMATOCRIT, BLOOD
HEMATOCRIT: 22.6 % — AB (ref 40.0–52.0)
Hemoglobin: 7.1 g/dL — ABNORMAL LOW (ref 13.0–18.0)

## 2017-05-04 MED ORDER — OXYCODONE HCL 5 MG PO TABS
5.0000 mg | ORAL_TABLET | Freq: Four times a day (QID) | ORAL | Status: DC | PRN
  Administered 2017-05-04 – 2017-05-05 (×3): 5 mg via ORAL
  Filled 2017-05-04 (×3): qty 1

## 2017-05-04 MED ORDER — PEG 3350-KCL-NA BICARB-NACL 420 G PO SOLR: 4000.0000 mL | Freq: Once | ORAL | Status: DC

## 2017-05-04 MED ORDER — SODIUM CHLORIDE 0.9 % IV SOLN
INTRAVENOUS | Status: DC
  Administered 2017-05-05 (×3): via INTRAVENOUS

## 2017-05-04 MED ORDER — SODIUM CHLORIDE 0.9 % IV SOLN: Freq: Once | INTRAVENOUS | Status: DC

## 2017-05-04 MED ORDER — PEG 3350-KCL-NA BICARB-NACL 420 G PO SOLR
4000.0000 mL | Freq: Once | ORAL | Status: AC
  Administered 2017-05-04: 4000 mL via ORAL
  Filled 2017-05-04: qty 4000

## 2017-05-04 NOTE — Consult Note (Signed)
Patient passing dark red stool.  Has hx of AVM in the past.  Discussed with him we need to do a colonoscopy and EGD tomorrow and he is in agreement.  4 Liter bowel prep to start today.  Clear liquid diet ok today.  Pt more alert today.  Answers questions appropriately. Chest clear anterior fields.  Heart 7-7/3 systolic murmur, hear same over right carotid.  Abd not as distended as yesterday, he did have dialysis today.   A: slow GI bleed, etio uncertain. Will do EGD and colonoscopy tomorrow.  He has had a few of these in the past.

## 2017-05-04 NOTE — Progress Notes (Signed)
Pre HD Assessment Pt is stable for tx, denies complaints, will receive 1 unit blood today.

## 2017-05-04 NOTE — Progress Notes (Signed)
Notified Dr Marcille Blanco of potassium 6.3 and hemoglobin 6.7. No new orders were given at this time. Will continue to monitor patient.

## 2017-05-04 NOTE — Progress Notes (Signed)
Post HD Assessment 

## 2017-05-04 NOTE — Consult Note (Addendum)
GI Inpatient Consult Note  Reason for Consult: GI bleeding, acute on chronic anemia   Attending Requesting Consult: Dr. Benjie Karvonen  History of Present Illness:  Stanley [name] is a 57 y.o. male with a complex past medical history including alcoholic cirrhosis, congestive heart failure, end stage renal disease on dialysis and biweekly paracentesis per nephrology, anemia of chronic disease, and polysubstance abuse admitted with GI bleeding, acute blood loss anemia, and hyperkalemia. GI history is notable for recurrent GI bleeding secondary to multiple small bowel and colonic AVMs (see summary of procedures below).  Colonoscopy: 08/12/16 - few localized erosions in the descending colon, ascending colon, and cecum; multiple angioectasias in the ascending colon treated with APC; 7 mm tubular adenoma in transverse colon; internal hemorrhoids. Small bowel endoscopy: 08/12/16 - few angioectasias in second portion of duodenum treated with APC; esophageal candidiasis. Capsule endoscopy: 06/21/16 - unremarkable. Upper endoscopy: 06/16/16 - small hiatal hernia, gastritis.  [name] states he began observing red-maroon colored blood intermixed with soft/thick stools over the last week. No change to bowel habits or melena. He endorses chronic generalized abdominal pain that is unchanged. He notes a history of acid reflux and indigestion, and reports a "metallic taste" when he coughs over the last week as well. He questions if this taste is coming from his esophagus versus post-nasal drip, as he has had several nose bleeds over this time. He reports taking ibuprofen 400-600 mg BID-TID to relieve the cough without improvement. He denies alcohol use, reporting his last alcoholic beverage was > 1 year ago. He also denies tobacco use. Weight is stable and appetite is good. No dysphagia, nausea, vomiting/hematemesis, constipation, or diarrhea.  Past Medical History:  Past Medical History:  Diagnosis Date  . Alcohol abuse    . CHF (congestive heart failure) ([place])   . Cirrhosis ([place])   . Coronary artery disease 2009  . Diabetic peripheral neuropathy ([place])   . Drug abuse ([place])   . End stage renal disease on dialysis [facility]) NEPHROLOGIST-   DR [facility]  IN [place]   HEMODIALYSIS --   TUES/  THURS/  SAT  . GERD (gastroesophageal reflux disease)   . Hyperlipidemia   . Hypertension   . PAD (peripheral artery disease) ([place])   . Renal insufficiency    Per pt, 32 oz fluid restriction per day  . S/P triple vessel bypass 06/09/2016   2009ish  . Suicidal ideation    & HOMICIDAL IDEATION --  06-16-2013   ADMITTED TO BEHAVIOR HEALTH    Problem List: Patient Active Problem List   Diagnosis Date Noted  . ESRD (end stage renal disease) ([place]) 04/28/2017  . Uremia 03/08/2017  . Hemodialysis patient ([place]) 03/03/2017  . Weakness 02/28/2017  . Hypocalcemia 02/22/2017  . Shortness of breath 11/26/2016  . COPD (chronic obstructive pulmonary disease) ([place]) 10/30/2016  . COPD exacerbation ([place]) 10/29/2016  . Anemia   . Heme positive stool   . Ulceration of intestine   . Benign neoplasm of transverse colon   . Acute gastrointestinal hemorrhage   . Esophageal candidiasis ([place])   . Angiodysplasia of intestinal tract   . Acute respiratory failure with hypoxia ([place]) 07/03/2016  . GI bleeding 06/24/2016  . Rectal bleeding 06/14/2016  . Anemia of chronic disease 06/01/2016  . MRSA carrier 06/01/2016  . Chronic renal failure 05/23/2016  . Ischemic heart disease 05/23/2016  . Angiodysplasia of small intestine   . Melena   . Small bowel bleed not requiring more than 4 units of  blood in 24 hours, ICU, or surgery   . Anemia due to chronic blood loss   . Abdominal pain 05/05/2016  . Acute posthemorrhagic anemia 04/17/2016  . Gastrointestinal bleed 04/17/2016  . History of esophagogastroduodenoscopy (EGD) 04/17/2016  . Elevated troponin 04/17/2016  . Alcohol abuse 04/17/2016  . Upper GI bleed 01/19/2016  . Blood in stool    . Angiodysplasia of stomach and duodenum with hemorrhage   . Gastritis   . Esophagitis, unspecified   . GI bleed 05/16/2015  . Acute GI bleeding   . Symptomatic anemia 04/30/2015  . HTN (hypertension) 04/06/2015  . GERD (gastroesophageal reflux disease) 04/06/2015  . HLD (hyperlipidemia) 04/06/2015  . Dyspnea 04/06/2015  . Cirrhosis of liver with ascites ([place]) 04/06/2015  . Ascites 04/06/2015  . GIB (gastrointestinal bleeding) 03/23/2015  . Homicidal ideation 06/19/2013  . Suicidal intent 06/19/2013  . Homicidal ideations 06/19/2013  . Hyperkalemia 06/16/2013  . Mandible fracture ([place]) 06/05/2013  . Fracture, mandible ([place]) 06/02/2013  . Coronary atherosclerosis of native coronary artery 06/02/2013  . ESRD on dialysis ([place]) 06/02/2013  . Mandible open fracture ([place]) 06/02/2013    Past Surgical History: Past Surgical History:  Procedure Laterality Date  . AGILE CAPSULE N/A 06/19/2016   Procedure: AGILE CAPSULE;  Surgeon: [name], MD;  Location: [facility] ENDOSCOPY;  Service: Endoscopy;  Laterality: N/A;  . COLONOSCOPY WITH PROPOFOL N/A 06/18/2016   Procedure: COLONOSCOPY WITH PROPOFOL;  Surgeon: [name], MD;  Location: [facility] ENDOSCOPY;  Service: Endoscopy;  Laterality: N/A;  . COLONOSCOPY WITH PROPOFOL N/A 08/12/2016   Procedure: COLONOSCOPY WITH PROPOFOL;  Surgeon: [name], MD;  Location: [facility] ENDOSCOPY;  Service: Endoscopy;  Laterality: N/A;  . CORONARY ANGIOPLASTY  ?   PT UNABLE TO TELL IF  BEFORE OR AFTER  CABG  . CORONARY ARTERY BYPASS GRAFT  2008  ([place] , [place])   3 VESSEL  . DIALYSIS FISTULA CREATION  LAST SURGERY  APPOX  2008  . ENTEROSCOPY N/A 05/10/2016   Procedure: ENTEROSCOPY;  Surgeon: Jerene Bears, MD;  Location: [place];  Service: Gastroenterology;  Laterality: N/A;  . ENTEROSCOPY N/A 08/12/2016   Procedure: ENTEROSCOPY;  Surgeon: [name], MD;  Location: [facility] ENDOSCOPY;  Service: Endoscopy;  Laterality: N/A;  . ESOPHAGOGASTRODUODENOSCOPY N/A 05/07/2015    Procedure: ESOPHAGOGASTRODUODENOSCOPY (EGD);  Surgeon: Hulen Luster, MD;  Location[facility] ENDOSCOPY;  Service: Endoscopy;  Laterality: N/A;  . ESOPHAGOGASTRODUODENOSCOPY (EGD) WITH PROPOFOL N/A 05/17/2015   Procedure: ESOPHAGOGASTRODUODENOSCOPY (EGD) WITH PROPOFOL;  Surgeon: [name], MD;  Location: [facility] ENDOSCOPY;  Service: Endoscopy;  Laterality: N/A;  . ESOPHAGOGASTRODUODENOSCOPY (EGD) WITH PROPOFOL N/A 01/20/2016   Procedure: ESOPHAGOGASTRODUODENOSCOPY (EGD) WITH PROPOFOL;  Surgeon: [name], MD;  Location: [facility] ENDOSCOPY;  Service: Endoscopy;  Laterality: N/A;  . ESOPHAGOGASTRODUODENOSCOPY (EGD) WITH PROPOFOL N/A 04/17/2016   Procedure: ESOPHAGOGASTRODUODENOSCOPY (EGD) WITH PROPOFOL;  Surgeon: Lin Landsman, MD;  Location: [facility] ENDOSCOPY;  Service: Gastroenterology;  Laterality: N/A;  . ESOPHAGOGASTRODUODENOSCOPY (EGD) WITH PROPOFOL  05/09/2016   Procedure: ESOPHAGOGASTRODUODENOSCOPY (EGD) WITH PROPOFOL;  Surgeon: Jerene Bears, MD;  Location: [place];  Service: Endoscopy;;  . ESOPHAGOGASTRODUODENOSCOPY (EGD) WITH PROPOFOL N/A 06/16/2016   Procedure: ESOPHAGOGASTRODUODENOSCOPY (EGD) WITH PROPOFOL;  Surgeon: [name], MD;  Location: [facility] ENDOSCOPY;  Service: Endoscopy;  Laterality: N/A;  . GIVENS CAPSULE STUDY N/A 05/07/2016   Procedure: GIVENS CAPSULE STUDY;  Surgeon: Doran Stabler, MD;  Location: [place];  Service: Endoscopy;  Laterality: N/A;  . MANDIBULAR HARDWARE REMOVAL N/A 07/29/2013   Procedure: REMOVAL  OF ARCH BARS;  Surgeon: Theodoro Kos, DO;  Location: [place];  Service: Plastics;  Laterality: N/A;  . ORIF MANDIBULAR FRACTURE N/A 06/05/2013   Procedure: REPAIR OF MANDIBULAR FRACTURE x 2 with maxillo-mandibular fixation ;  Surgeon: Theodoro Kos, DO;  Location: [place];  Service: Plastics;  Laterality: N/A;  . PERIPHERAL ARTERIAL STENT GRAFT Left     Allergies: No Known Allergies  Home Medications: Medications Prior to Admission  Medication Sig  Dispense Refill Last Dose  . albuterol (PROVENTIL HFA;VENTOLIN HFA) 108 (90 Base) MCG/ACT inhaler Inhale 2 puffs into the lungs every 6 (six) hours as needed for wheezing or shortness of breath. 1 Inhaler 0 prn at prn  . atorvastatin (LIPITOR) 40 MG tablet Take 1 tablet (40 mg total) by mouth daily. 30 tablet 1 Past Week at 0800  . budesonide-formoterol (SYMBICORT) 160-4.5 MCG/ACT inhaler Inhale 2 puffs into the lungs daily. 1 Inhaler 1 Past Week at 0800  . calcium acetate (PHOSLO) 667 MG capsule Take 2,001 mg by mouth 3 (three) times daily.   Past Week at 0800  . ferrous sulfate 325 (65 FE) MG tablet Take 1 tablet by mouth 3 (three) times daily.  2 Past Week at 0800  . furosemide (LASIX) 80 MG tablet Take 1 tablet (80 mg total) daily by mouth. 30 tablet 1 Past Week at 0800  . gabapentin (NEURONTIN) 300 MG capsule Take 1 capsule by mouth daily as directed  11 Past Week at 0800  . ipratropium-albuterol (DUONEB) 0.5-2.5 (3) MG/3ML SOLN Take 3 mLs by nebulization every 6 (six) hours as needed. 360 mL 0 prn at prn  . labetalol (NORMODYNE) 100 MG tablet Take 100 mg by mouth daily.  12 Past Week at 0800  . Multiple Vitamins-Minerals-FA (DIALYVITE SUPREME D) 3 MG TABS Take 1 tablet (3 mg total) by mouth daily. 30 tablet 0 Past Week at 0800  . nitroGLYCERIN (NITROSTAT) 0.4 MG SL tablet Place 1 tablet (0.4 mg total) under the tongue every 5 (five) minutes as needed. 25 tablet 6 prn at prn  . pantoprazole (PROTONIX) 40 MG tablet Take 1 tablet (40 mg total) by mouth daily. 30 tablet 0 Past Week at 0800  . tiotropium (SPIRIVA HANDIHALER) 18 MCG inhalation capsule Place 1 capsule (18 mcg total) into inhaler and inhale daily. 30 capsule 2 Past Week at 0800  . alum & mag hydroxide-simeth (MAALOX/MYLANTA) 200-200-20 MG/5ML suspension Take 15 mLs every 4 (four) hours as needed by mouth for indigestion or heartburn. (Patient not taking: Reported on 04/19/2017) 355 mL 0 Not Taking at Unknown time  . lactulose (CHRONULAC)  10 GM/15ML solution Take 30 mLs (20 g total) by mouth 3 (three) times daily. (Patient not taking: Reported on 04/05/2017) 240 mL 0 Not Taking at Unknown time  . Spacer/Aero Chamber Mouthpiece MISC 1 Units by Does not apply route every 4 (four) hours as needed (wheezing). 1 each 0 UTD at UTD   Home medication reconciliation was completed with the patient.   Scheduled Inpatient Medications:   . atorvastatin  40 mg Oral Daily  . calcium acetate  2,001 mg Oral TID WC  . ferrous sulfate  325 mg Oral TID  . furosemide  80 mg Oral Daily  . gabapentin  300 mg Oral BID  . labetalol  100 mg Oral Daily  . lactulose  20 g Oral TID  . mometasone-formoterol  2 puff Inhalation BID  . multivitamin  1 tablet Oral QHS  . pantoprazole (PROTONIX) IV  40 mg Intravenous Q12H  . sodium chloride flush  3 mL Intravenous Q12H  . tiotropium  18 mcg Inhalation Daily    Continuous Inpatient Infusions:   . sodium chloride    . calcium gluconate 1 GM IVPB (Mini-Bag Plus)    . octreotide  (SANDOSTATIN)    IV infusion 50 mcg/hr (05/04/17 0604)    PRN Inpatient Medications:  sodium chloride, acetaminophen **OR** acetaminophen, albuterol, diphenhydrAMINE, guaiFENesin, ondansetron **OR** ondansetron (ZOFRAN) IV, oxyCODONE, polyethylene glycol  Family History: family history includes Cancer in his father and sister; Colon cancer in his mother; Kidney disease in his brother.   Social History:   reports that he has been smoking cigarettes.  He has a 6.00 pack-year smoking history. he has never used smokeless tobacco. He reports that he does not drink alcohol or use drugs.  Review of Systems: Constitutional: Weight is stable.  Eyes: No changes in vision. ENT: No oral lesions, sore throat.  GI: see HPI.  Heme/Lymph: No easy bruising.  CV: No chest pain.  GU: No hematuria.  Integumentary: No rashes.  Neuro: No headaches.  Psych: No depression/anxiety.  Endocrine: No heat/cold intolerance.   Allergic/Immunologic: No urticaria.  Resp: No cough, SOB.  Musculoskeletal: No joint swelling.    Physical Examination: BP 113/87 (BP Location: Left Arm)   Pulse 86   Temp 98.9 F (37.2 C) (Oral)   Resp 17   Ht 6\' 3"  (1.905 m)   Wt 72.1 kg (159 lb)   SpO2 100%   BMI 19.87 kg/m  Gen: NAD, alert and oriented x 4 HEENT: PEERLA, EOMI, Neck: supple, no JVD or thyromegaly Chest: CTA bilaterally, no wheezes, crackles, or other adventitious sounds CV: RRR, no m/g/c/r Abd: soft, distended secondary to moderate-volume ascites, diffuse tenderness throughout the abdomen to light palpation,+BS in all four quadrants; no HSM, guarding, ridigity, or rebound tenderness Ext: no edema, well perfused with 2+ pulses Skin: no rash or lesions noted Lymph: no LAD  Data: Lab Results  Component Value Date   WBC 6.0 05/04/2017   HGB 6.7 (L) 05/04/2017   HCT 21.1 (L) 05/04/2017   MCV 86.8 05/04/2017   PLT 218 05/04/2017   Recent Labs  Lab 05/03/17 1753 05/04/17 0138 05/04/17 0334  HGB 5.8* 6.3* 6.7*   Lab Results  Component Value Date   NA 141 05/04/2017   K 6.3 (HH) 05/04/2017   CL 97 (L) 05/04/2017   CO2 30 05/04/2017   BUN 65 (H) 05/04/2017   CREATININE 8.26 (H) 05/04/2017   Lab Results  Component Value Date   ALT 18 05/03/2017   AST 23 05/03/2017   ALKPHOS 129 (H) 05/03/2017   BILITOT 0.6 05/03/2017   Recent Labs  Lab 05/03/17 0803  INR 1.14   Assessment/Plan: Mr. Councilman is a 57 y.o. male with a complex past medical history including alcoholic cirrhosis, congestive heart failure, end stage renal disease on dialysis and biweekly paracentesis per nephrology, anemia of chronic disease, and polysubstance abuse admitted with GI bleeding, acute blood loss anemia, and hyperkalemia. He reports bright red/maroon blood in stools x 1 week, yielding a 2g/dL decrease in Hgb (5.3) from baseline (8-9). He was taking ibuprofen BID-TID at home. Due to his history of multiple AVMs in the  small intestine and colon, EGD & colonoscopy are recommended for repeat luminal evaluation and likely re-treatment of AVMs with APC. However, patient's Hgb will need to improve to > 7 prior to procedures.  Recommendations: - Continue monitoring serial CBCs. If Hgb  remains at 6 with morning labs, recommend transfusing to Hgb > 7. - Continue IV pantoprazole q 12 hrs + IV octreotide infusions as prescribed. - Further recommendations & coordination of procedures per Dr. Vira Agar. - Advised patient to avoid all NSAIDs due to history of AVMs and recurrent GI bleeding. Congratulated on alcohol abstinence.  Thank you for the consult. We will follow along with you. Please call with questions or concerns.  Lavera Guise, PA-C [facility] Gastroenterology Phone: [phone] Pager: [phone]

## 2017-05-04 NOTE — Progress Notes (Signed)
Patient complains of itching and states he has been itching all day. Notified Dr Bridgett Larsson and he ordered Benadryl prn. Will continue to monitor patient.

## 2017-05-04 NOTE — Progress Notes (Signed)
[facility] [place] NAME: Stanley [name]    MR#:  [id number]  DATE OF BIRTH:  [birth year]  SUBJECTIVE:  CHIEF COMPLAINT:   Chief Complaint  Patient presents with  . Hematuria  . Hematemesis     Came with dark stool. Hemoglobin dropped and received transfusion today during hemodialysis. Still having complained of dark stool, mild abdominal pain.  REVIEW OF SYSTEMS:  CONSTITUTIONAL: No fever, we have fatigue or weakness.  EYES: No blurred or double vision.  EARS, NOSE, AND THROAT: No tinnitus or ear pain.  RESPIRATORY: No cough, shortness of breath, wheezing or hemoptysis.  CARDIOVASCULAR: No chest pain, orthopnea, edema.  GASTROINTESTINAL: No nausea, vomiting, diarrhea or abdominal pain. Dark stool. GENITOURINARY: No dysuria, hematuria.  ENDOCRINE: No polyuria, nocturia,  HEMATOLOGY: No anemia, easy bruising or bleeding SKIN: No rash or lesion. MUSCULOSKELETAL: No joint pain or arthritis.   NEUROLOGIC: No tingling, numbness, weakness.  PSYCHIATRY: No anxiety or depression.   ROS  DRUG ALLERGIES:  No Known Allergies  VITALS:  Blood pressure 123/62, pulse 79, temperature (!) 97.1 F (36.2 C), temperature source Oral, resp. rate 16, height 6\' 3"  (1.905 m), weight 77.9 kg (171 lb 11.8 oz), SpO2 99 %.  PHYSICAL EXAMINATION:  GENERAL:  57 y.o.-year-old patient lying in the bed with no acute distress.  EYES: Pupils equal, round, reactive to light and accommodation. No scleral icterus. Extraocular muscles intact.  HEENT: Head atraumatic, normocephalic. Oropharynx and nasopharynx clear.  NECK:  Supple, no jugular venous distention. No thyroid enlargement, no tenderness.  LUNGS: Normal breath sounds bilaterally, no wheezing, rales,rhonchi or crepitation. No use of accessory muscles of respiration.  CARDIOVASCULAR: S1, S2 normal. No murmurs, rubs, or gallops.  ABDOMEN: Soft, nontender, nondistended. Bowel sounds present. No organomegaly or  mass.  EXTREMITIES: No pedal edema, cyanosis, or clubbing.  NEUROLOGIC: Cranial nerves II through XII are intact. Muscle strength 5/5 in all extremities. Sensation intact. Gait not checked.  PSYCHIATRIC: The patient is alert and oriented x 3.  SKIN: No obvious rash, lesion, or ulcer.   Physical Exam LABORATORY PANEL:   CBC Recent Labs  Lab 05/04/17 0334 05/04/17 0831  WBC 6.0  --   HGB 6.7* 6.7*  HCT 21.1*  --   PLT 218  --    ------------------------------------------------------------------------------------------------------------------  Chemistries  Recent Labs  Lab 05/03/17 0803  05/04/17 0334  NA 139  --  141  K 7.5*   < > 6.3*  CL 92*  --  97*  CO2 33*  --  30  GLUCOSE 97  --  105*  BUN 92*  --  65*  CREATININE 11.20*  --  8.26*  CALCIUM 8.3*  --  7.8*  AST 23  --   --   ALT 18  --   --   ALKPHOS 129*  --   --   BILITOT 0.6  --   --    < > = values in this interval not displayed.   ------------------------------------------------------------------------------------------------------------------  Cardiac Enzymes Recent Labs  Lab 05/03/17 0803  TROPONINI 0.06*   ------------------------------------------------------------------------------------------------------------------  RADIOLOGY:  Dg Chest 2 View  Result Date: 05/03/2017 CLINICAL DATA:  Hematemesis EXAM: CHEST  2 VIEW COMPARISON:  04/12/2017 FINDINGS: Cardiac shadow remains enlarged. Postsurgical changes are again seen. The lungs are well aerated bilaterally. No focal infiltrate or sizable effusion is seen. No bony abnormality is noted. IMPRESSION: No acute abnormality seen. Electronically Signed   By: Linus Mako.D.  On: 05/03/2017 07:17    ASSESSMENT AND PLAN:   Active Problems:   Hyperkalemia  1. Hematemesis: on IV PPI and octreotide Monitor hemoglobin GI consultation appreciated, as patient had history of AVM, GI suggesting endoscopy and colonoscopy tomorrow.  2. Acute on chronic  blood loss anemia:  Continue ferrous sulfate  Received 1 unit of blood transfusion during dialysis.  3. Hyperkalemia:   emergent dialysis He has been noncompliant with dialysis sessions  Nephrology on case.  4. Chronic diastolic heart failure without signs of exacerbation  5. History of liver cirrhosis: Continue Lasix  6. History of COPD: Continue inhalers  7. Hyperlipidemia: Continue statin  8. CAD/bypass: Continue statin and labetalol   All the records are reviewed and case discussed with Care Management/Social Workerr. Management plans discussed with the patient, family and they are in agreement.  CODE STATUS: full.  TOTAL TIME TAKING CARE OF THIS PATIENT: 35 minutes.    POSSIBLE D/C IN 1-2 DAYS, DEPENDING ON CLINICAL CONDITION.   Vaughan Basta M.D on 05/04/2017   Between 7am to 6pm - Pager - [phone]  After 6pm go to [email or web address] - password [email or web address] [place] [facility]  Office  [phone]  CC: Primary care physician; [name], MD  Note: This dictation was prepared with Dragon dictation along with smaller phrase technology. Any transcriptional errors that result from this process are unintentional.

## 2017-05-04 NOTE — Progress Notes (Signed)
I was notified by Endoscopy that Anesthesiology cannot sedate patient today citing relative contraindication due to hemodialysis.I have also been notified patient is having hematochezia in the past 24 hours suggesting lower GI source.  Will reschedule EGD, adding colonoscopy, for tomorrow. Schedule has been updated. Dr. Vira Agar to perform.

## 2017-05-04 NOTE — Progress Notes (Signed)
[facility], [place] 05/04/17  Subjective:  He presents this time via EMS from home with complaints of bright red blood in the vomit and urine. At presentation, his potassium was 7.5.  He was dialyzed urgently yesterday but he did not complete his treatment.  Intradialytic potassium was 5.3 He is brought down to dialysis suite for repeat treatment because his potassium is high at 6.3 this morning.  Patient seen and evaluated during HD. GI evaluation is ongoing.  He is scheduled for EGD and colonoscopy tomorrow    Objective:  Vital signs in last 24 hours:  Temp:  [97.1 F (36.2 C)-98.9 F (37.2 C)] 97.1 F (36.2 C) (02/21 1400) Pulse Rate:  [78-93] 79 (02/21 1400) Resp:  [12-26] 16 (02/21 1345) BP: (102-143)/(62-87) 123/62 (02/21 1400) SpO2:  [89 %-100 %] 99 % (02/21 1400) Weight:  [77.9 kg (171 lb 11.8 oz)] 77.9 kg (171 lb 11.8 oz) (02/21 0933)  Weight change:  Filed Weights   05/03/17 0633 05/04/17 0933  Weight: 72.1 kg (159 lb) 77.9 kg (171 lb 11.8 oz)    Intake/Output:    Intake/Output Summary (Last 24 hours) at 05/04/2017 1425 Last data filed at 05/04/2017 1419 Gross per 24 hour  Intake 1610 ml  Output 2504 ml  Net -894 ml     Physical Exam: General: NAD laying in bed  HEENT Moist oral mucus membranes  Neck supple  Pulm/lungs Clear bilateral  CVS/Heart regular  Abdomen:  Soft, distended from ascites  Extremities: Trace  Neurologic: Resting quietly  Skin: No acute rashes  Access: Right arm AV graft       Basic Metabolic Panel:  Recent Labs  Lab 04/28/17 1055 04/28/17 1337 05/03/17 0803 05/03/17 1449 05/04/17 0334  NA 139  --  139  --  141  K 6.4* 3.2* 7.5* 5.3* 6.3*  CL 96*  --  92*  --  97*  CO2 30  --  33*  --  30  GLUCOSE 70  --  97  --  105*  BUN 51*  --  92*  --  65*  CREATININE 9.33*  --  11.20*  --  8.26*  CALCIUM 8.2*  --  8.3*  --  7.8*  PHOS 7.2*  7.2*  --   --   --   --      CBC: Recent Labs  Lab  04/28/17 1055 05/03/17 0803 05/03/17 1753 05/04/17 0138 05/04/17 0334 05/04/17 0831  WBC 8.0 7.6  --   --  6.0  --   NEUTROABS  --  5.8  --   --   --   --   HGB 8.4* 6.3* 5.8* 6.3* 6.7* 6.7*  HCT 26.8* 20.5*  --   --  21.1*  --   MCV 84.9 86.4  --   --  86.8  --   PLT 336 280  --   --  218  --       Lab Results  Component Value Date   HEPBSAG Negative 03/01/2017   HEPBSAB Reactive 03/01/2017   HEPBIGM Negative 06/01/2016      Microbiology:  Recent Results (from the past 240 hour(s))  MRSA PCR Screening     Status: None   Collection Time: 05/03/17  1:35 PM  Result Value Ref Range Status   MRSA by PCR NEGATIVE NEGATIVE Final    Comment:        The GeneXpert MRSA Assay (FDA approved for NASAL specimens only), is one component  of a comprehensive MRSA colonization surveillance program. It is not intended to diagnose MRSA infection nor to guide or monitor treatment for MRSA infections. Performed at [facility], [address] [place],  [address]     Coagulation Studies: Recent Labs    05/03/17 0803  LABPROT 14.5  INR 1.14    Urinalysis: No results for input(s): COLORURINE, LABSPEC, PHURINE, GLUCOSEU, HGBUR, BILIRUBINUR, KETONESUR, PROTEINUR, UROBILINOGEN, NITRITE, LEUKOCYTESUR in the last 72 hours.  Invalid input(s): APPERANCEUR    Imaging: Dg Chest 2 View  Result Date: 05/03/2017 CLINICAL DATA:  Hematemesis EXAM: CHEST  2 VIEW COMPARISON:  04/12/2017 FINDINGS: Cardiac shadow remains enlarged. Postsurgical changes are again seen. The lungs are well aerated bilaterally. No focal infiltrate or sizable effusion is seen. No bony abnormality is noted. IMPRESSION: No acute abnormality seen. Electronically Signed   By: Inez Catalina M.D.   On: 05/03/2017 07:17     Medications:       Assessment/ Plan:  57 y.o. male with end stage renal disease on hemodialysis secondary to Alport's syndrome, ascites, hypertension, anemia of chronic kidney  disease, coronary artery disease, peripheral vascular disease, hyperlipidemia, gastrointestinal AVMs, echo in April 2018-> severe pulmonary hypertension, LVH, EF 50-55% moderate to severe mitral regurgitation, left atrial dilation, moderate tricuspid regurgitation likely leading to congestive hepatic insufficiency   [facility] [place] AVF- no outpatient dialysis unit currently  1.  ESRD 2.  Anemia of chronic kidney disease and GI Bleed 3.  Secondary Hyperparathyroidism 4.  Ascites 5.  Severe Hyperkalemia   Patient presents with severe hyperkalemia and received urgent HD yesterday although he stayed for partial treatment. He has ongoing GI bleed. He received blood transfusion at HD today Plan for endoscopy tomorrow Recheck K in AM       LOS: [place] 2/21/20192:25 [place], [place]

## 2017-05-04 NOTE — Progress Notes (Signed)
Per Dr. Tiffany Kocher okay for RN to make pt NPO with sips with meds. Pt will have an EGD later today.

## 2017-05-04 NOTE — Progress Notes (Signed)
Pre HD Tx  

## 2017-05-04 NOTE — Consult Note (Signed)
Patient very lethargic, had a unit of blood last night.  He has hx of GI bleeding from AVM in upper and lower GI areas.  Exam shows exp wheeze more on left than on right.  He needs a EGD and will schedule today with Dr. Alice Reichert. Briefly discussed patient with him yesterday.  Abd exam shows bowel sounds present, abd distended.

## 2017-05-04 NOTE — Progress Notes (Signed)
Post HD Tx - pt tolerated tx well, UF 2.5L, pt is having some pain, RN notified and will bring medication. Pt is stable for transport beck to room

## 2017-05-05 ENCOUNTER — Other Ambulatory Visit: Payer: Self-pay

## 2017-05-05 ENCOUNTER — Ambulatory Visit: Admit: 2017-05-05 | Payer: Medicare Other | Admitting: Unknown Physician Specialty

## 2017-05-05 ENCOUNTER — Inpatient Hospital Stay: Payer: Medicare Other | Admitting: Anesthesiology

## 2017-05-05 ENCOUNTER — Encounter
Admission: EM | Discharge status: Against Medical Advice | Payer: Self-pay | Source: Home / Self Care | Attending: Internal Medicine

## 2017-05-05 HISTORY — PX: ESOPHAGOGASTRODUODENOSCOPY (EGD) WITH PROPOFOL: SHX5813

## 2017-05-05 HISTORY — PX: COLONOSCOPY WITH PROPOFOL: SHX5780

## 2017-05-05 LAB — POTASSIUM: Potassium: 5 mmol/L (ref 3.5–5.1)

## 2017-05-05 LAB — TYPE AND SCREEN
ABO/RH(D): A POS
Antibody Screen: NEGATIVE
UNIT DIVISION: 0
UNIT DIVISION: 0
Unit division: 0

## 2017-05-05 LAB — BPAM RBC
BLOOD PRODUCT EXPIRATION DATE: 201902272359
Blood Product Expiration Date: 201902252359
Blood Product Expiration Date: 201903072359
ISSUE DATE / TIME: 201902202256
ISSUE DATE / TIME: 201902210950
Unit Type and Rh: 600
Unit Type and Rh: 600
Unit Type and Rh: 6200

## 2017-05-05 LAB — HEMOGLOBIN
HEMOGLOBIN: 7.2 g/dL — AB (ref 13.0–18.0)
Hemoglobin: 7.5 g/dL — ABNORMAL LOW (ref 13.0–18.0)

## 2017-05-05 LAB — PREPARE RBC (CROSSMATCH)

## 2017-05-05 SURGERY — ESOPHAGOGASTRODUODENOSCOPY (EGD) WITH PROPOFOL
Anesthesia: General

## 2017-05-05 MED ORDER — PROPOFOL 500 MG/50ML IV EMUL
INTRAVENOUS | Status: AC
  Filled 2017-05-05: qty 50

## 2017-05-05 MED ORDER — PHENYLEPHRINE HCL 10 MG/ML IJ SOLN
INTRAMUSCULAR | Status: DC | PRN
  Administered 2017-05-05 (×5): 100 ug via INTRAVENOUS

## 2017-05-05 MED ORDER — PEG 3350-KCL-NA BICARB-NACL 420 G PO SOLR: 4000.0000 mL | Freq: Once | ORAL | Status: DC

## 2017-05-05 MED ORDER — PROPOFOL 500 MG/50ML IV EMUL
INTRAVENOUS | Status: DC | PRN
  Administered 2017-05-05: 100 ug/kg/min via INTRAVENOUS

## 2017-05-05 MED ORDER — SODIUM CHLORIDE 0.9 % IV SOLN: INTRAVENOUS | Status: DC

## 2017-05-05 MED ORDER — SODIUM CHLORIDE 0.9 % IV SOLN: Freq: Once | INTRAVENOUS | Status: DC

## 2017-05-05 MED ORDER — VITAMIN C 500 MG PO TABS
500.0000 mg | ORAL_TABLET | Freq: Two times a day (BID) | ORAL | Status: DC
  Filled 2017-05-05 (×2): qty 1

## 2017-05-05 MED ORDER — NEPRO/CARBSTEADY PO LIQD: 237.0000 mL | Freq: Two times a day (BID) | ORAL | Status: DC

## 2017-05-05 MED ORDER — PROPOFOL 10 MG/ML IV BOLUS
INTRAVENOUS | Status: DC | PRN
  Administered 2017-05-05 (×2): 20 mg via INTRAVENOUS

## 2017-05-05 MED ORDER — EPHEDRINE SULFATE 50 MG/ML IJ SOLN
INTRAMUSCULAR | Status: DC | PRN
  Administered 2017-05-05 (×3): 10 mg via INTRAVENOUS

## 2017-05-05 NOTE — OR Nursing (Signed)
Dr. Vira Agar into see patient to review procedure results.

## 2017-05-05 NOTE — Anesthesia Post-op Follow-up Note (Signed)
Anesthesia QCDR form completed.        

## 2017-05-05 NOTE — Transfer of Care (Signed)
Immediate Anesthesia Transfer of Care Note  Patient: Stanley [name]  Procedure(s) Performed: ESOPHAGOGASTRODUODENOSCOPY (EGD) WITH PROPOFOL (N/A ) COLONOSCOPY WITH PROPOFOL (N/A )  Patient Location: PACU and Endoscopy Unit  Anesthesia Type:General  Level of Consciousness: awake, alert  and oriented  Airway & Oxygen Therapy: Patient Spontanous Breathing  Post-op Assessment: Report given to RN and Post -op Vital signs reviewed and stable  Post vital signs: Reviewed and stable  Last Vitals:  Vitals:   05/05/17 0427 05/05/17 1226  BP: 119/71 95/63  Pulse: 84 72  Resp: 20 15  Temp: 37.1 C (!) 36.2 C  SpO2: 94% 99%    Last Pain:  Vitals:   05/05/17 1226  TempSrc:   PainSc: Asleep      Patients Stated Pain Goal: 0 (33/61/22 4497)  Complications: No apparent anesthesia complications

## 2017-05-05 NOTE — Progress Notes (Signed)
Patient insisted that he leave now.  Dialysis nurse informed me that she would not be able to start patient's dialysis session until about 4pm.  Patient did say he was leaving and admitted that he needed a cigarette.  Dr Anselm Jungling and Dr [name] notified.  They both came to see the patient and tried to persuade him to stay.  The patient insisted that he would not have a ride this evening.  Dr [name] offered to keep the patient tonight so he could get HD and blood and discharge the patient tomorrow but the patient still insisted that he was leaving.  Patient signed the AMA form and was discharged via wheelchair to his waiting ride

## 2017-05-05 NOTE — Op Note (Signed)
[facility] Patient Name: Stanley [name] Procedure Date: 05/05/2017 11:30 AM MRN: [id number] Account #: [id number] Date of Birth: December 10, 1960 Admit Type: Inpatient Age: 57 Room: [facility] ENDO ROOM 1 Gender: Male Note Status: Finalized Procedure:            Upper GI endoscopy Indications:          [place] Providers:            Manya Silvas, MD Referring MD:         No Local Md, MD (Referring MD) Medicines:            Propofol per Anesthesia Complications:        No immediate complications. Procedure:            Pre-Anesthesia Assessment:                       - After reviewing the risks and benefits, the patient                        was deemed in satisfactory condition to undergo the                        procedure.                       After obtaining informed consent, the endoscope was                        passed under direct vision. Throughout the procedure,                        the patient's blood pressure, pulse, and oxygen                        saturations were monitored continuously. The Endoscope                        was introduced through the mouth, and advanced to the                        second part of duodenum. The upper GI endoscopy was                        accomplished without difficulty. The patient tolerated                        the procedure well. Findings:      LA Grade A (one or more mucosal breaks less than 5 mm, not extending       between tops of 2 mucosal folds) esophagitis with no bleeding was found       40 cm from the incisors. Small hiatal hernia.      Grade I varices were found in the distal esophagus. They were small in       size.      Diffuse granular mucosa was found in the gastric body.      The examined duodenum was normal. Impression:           - LA Grade A reflux esophagitis.                       -  Grade I esophageal varices.                       - Granular gastric mucosa.      - Normal examined duodenum.                       - No specimens collected. Recommendation:       - The findings and recommendations were discussed with                        the patient. After his colonoscopy. Manya Silvas, MD 05/05/2017 11:48:35 AM This report has been signed electronically. Number of Addenda: 0 Note Initiated On: 05/05/2017 11:30 AM      [facility]

## 2017-05-05 NOTE — Progress Notes (Signed)
Patient just left for endoscopy/colonoscopy

## 2017-05-05 NOTE — Consult Note (Signed)
Patient had successful EGD and colonoscopy with removal of several polyps, small and medium.  EGD unremarkable.  Will put on renal diet.  No observed  blood anywhere in either procedure.    No further imput from me.  Call if needed.

## 2017-05-05 NOTE — Anesthesia Postprocedure Evaluation (Signed)
Anesthesia Post Note  Patient: Stanley [name]  Procedure(s) Performed: ESOPHAGOGASTRODUODENOSCOPY (EGD) WITH PROPOFOL (N/A ) COLONOSCOPY WITH PROPOFOL (N/A )  Patient location during evaluation: Endoscopy Anesthesia Type: General Level of consciousness: awake and alert and oriented Pain management: pain level controlled Vital Signs Assessment: post-procedure vital signs reviewed and stable Respiratory status: spontaneous breathing, nonlabored ventilation and respiratory function stable Cardiovascular status: blood pressure returned to baseline and stable Postop Assessment: no signs of nausea or vomiting Anesthetic complications: no     Last Vitals:  Vitals:   05/05/17 1236 05/05/17 1246  BP: 96/68 (!) 86/69  Pulse: 68 73  Resp: 16 16  Temp:    SpO2: 98% 95%    Last Pain:  Vitals:   05/05/17 1246  TempSrc:   PainSc: 0-No pain                 Yehya Brendle

## 2017-05-05 NOTE — Op Note (Signed)
[facility] Patient Name: Stanley [name] Procedure Date: 05/05/2017 11:29 AM MRN: [id number] Account #: [id number] Date of Birth: Jan 29, 1961 Admit Type: Inpatient Age: 57 Room: [facility] ENDO ROOM 1 Gender: Male Note Status: Finalized Procedure:            Colonoscopy Indications:          Gastrointestinal bleeding Providers:            Manya Silvas, MD Referring MD:         No Local Md, MD (Referring MD) Medicines:            Propofol per Anesthesia Complications:        No immediate complications. Procedure:            Pre-Anesthesia Assessment:                       - After reviewing the risks and benefits, the patient                        was deemed in satisfactory condition to undergo the                        procedure.                       After obtaining informed consent, the colonoscope was                        passed under direct vision. Throughout the procedure,                        the patient's blood pressure, pulse, and oxygen                        saturations were monitored continuously. The                        Colonoscope was introduced through the anus and                        advanced to the the ascending colon. The colonoscopy                        was aborted due to significant looping and a tortuous                        colon. Findings:      No blood seen anywhere in colon. The scope was advanced to the proximal       ascending colon.      Two sessile polyps were found in the ascending colon. The polyps were       small in size. These polyps were removed with a hot snare. Resection and       retrieval were complete.      Five sessile polyps were found in the transverse colon. The polyps were       small in size. These polyps were removed with a hot snare. Resection and       retrieval were complete. To prevent bleeding after the polypectomy,       three hemostatic clips were successfully placed. There was  no bleeding  during, or at the end, of the procedure.      A medium polyp was found in the sigmoid colon. The polyp was sessile.       The polyp was removed with a hot snare. Resection and retrieval were       complete. Impression:           - The procedure was aborted due to significant looping                        and a tortuous colon.                       - Two small polyps in the ascending colon, removed with                        a hot snare. Resected and retrieved.                       - Five small polyps in the transverse colon, removed                        with a hot snare. Resected and retrieved. Clips were                        placed.                       - One medium polyp in the sigmoid colon, removed with a                        hot snare. Resected and retrieved. Recommendation:       - Await pathology results. Manya Silvas, MD 05/05/2017 12:32:55 PM This report has been signed electronically. Number of Addenda: 0 Note Initiated On: 05/05/2017 11:29 AM Scope Withdrawal Time: 0 hours 26 minutes 6 seconds  Total Procedure Duration: 0 hours 32 minutes 44 seconds       [facility]

## 2017-05-05 NOTE — Care Management Important Message (Signed)
Important Message  Patient Details  Name: Stanley [name] MRN: [id number] Date of Birth: 1960/09/13   Medicare Important Message Given:  Yes    Beverly Sessions, RN 05/05/2017, 10:42 AM

## 2017-05-05 NOTE — Discharge Summary (Signed)
Pt was admitted for GI bleed and anemia. He had EGD and colonoscopy done, plan was to do HD and give one more Unit PRBC transfusion today. But as he does not have ride late in evening to go home, he wanted to go AMA. I and Dr. [name] have visited him again, offered to stay tonight, if very later after HD, but he did not change his mind and left AMA.  Please see progress note for further details Discharge was against medical advise.

## 2017-05-05 NOTE — Progress Notes (Signed)
[facility] [place] NAME: Stanley [name]    MR#:  [id number]  DATE OF BIRTH:  [birth year]  SUBJECTIVE:  CHIEF COMPLAINT:   Chief Complaint  Patient presents with  . Hematuria  . Hematemesis     Came with dark stool. Hemoglobin dropped and received transfusion today during hemodialysis. No dark stool, awaited procedure today.   REVIEW OF SYSTEMS:  CONSTITUTIONAL: No fever, we have fatigue or weakness.  EYES: No blurred or double vision.  EARS, NOSE, AND THROAT: No tinnitus or ear pain.  RESPIRATORY: No cough, shortness of breath, wheezing or hemoptysis.  CARDIOVASCULAR: No chest pain, orthopnea, edema.  GASTROINTESTINAL: No nausea, vomiting, diarrhea or abdominal pain. Dark stool. GENITOURINARY: No dysuria, hematuria.  ENDOCRINE: No polyuria, nocturia,  HEMATOLOGY: No anemia, easy bruising or bleeding SKIN: No rash or lesion. MUSCULOSKELETAL: No joint pain or arthritis.   NEUROLOGIC: No tingling, numbness, weakness.  PSYCHIATRY: No anxiety or depression.   ROS  DRUG ALLERGIES:  No Known Allergies  VITALS:  Blood pressure 103/64, pulse 72, temperature 98.2 F (36.8 C), temperature source Oral, resp. rate 18, height 6\' 3"  (1.905 m), weight 78 kg (171 lb 15.3 oz), SpO2 99 %.  PHYSICAL EXAMINATION:  GENERAL:  57 y.o.-year-old patient lying in the bed with no acute distress.  EYES: Pupils equal, round, reactive to light and accommodation. No scleral icterus. Extraocular muscles intact.  HEENT: Head atraumatic, normocephalic. Oropharynx and nasopharynx clear.  NECK:  Supple, no jugular venous distention. No thyroid enlargement, no tenderness.  LUNGS: Normal breath sounds bilaterally, no wheezing, rales,rhonchi or crepitation. No use of accessory muscles of respiration.  CARDIOVASCULAR: S1, S2 normal. No murmurs, rubs, or gallops.  ABDOMEN: Soft, nontender, nondistended. Bowel sounds present. No organomegaly or mass.  EXTREMITIES: No  pedal edema, cyanosis, or clubbing.  NEUROLOGIC: Cranial nerves II through XII are intact. Muscle strength 5/5 in all extremities. Sensation intact. Gait not checked.  PSYCHIATRIC: The patient is alert and oriented x 3.  SKIN: No obvious rash, lesion, or ulcer.   Physical Exam LABORATORY PANEL:   CBC Recent Labs  Lab 05/04/17 0334  05/04/17 1808  05/05/17 0946  WBC 6.0  --   --   --   --   HGB 6.7*   < > 7.1*  7.1*   < > 7.2*  HCT 21.1*  --  22.6*  --   --   PLT 218  --   --   --   --    < > = values in this interval not displayed.   ------------------------------------------------------------------------------------------------------------------  Chemistries  Recent Labs  Lab 05/03/17 0803  05/04/17 0334  05/05/17 0223  NA 139  --  141  --   --   K 7.5*   < > 6.3*   < > 5.0  CL 92*  --  97*  --   --   CO2 33*  --  30  --   --   GLUCOSE 97  --  105*  --   --   BUN 92*  --  65*  --   --   CREATININE 11.20*  --  8.26*  --   --   CALCIUM 8.3*  --  7.8*  --   --   AST 23  --   --   --   --   ALT 18  --   --   --   --   ALKPHOS 129*  --   --   --   --  BILITOT 0.6  --   --   --   --    < > = values in this interval not displayed.   ------------------------------------------------------------------------------------------------------------------  Cardiac Enzymes Recent Labs  Lab 05/03/17 0803  TROPONINI 0.06*   ------------------------------------------------------------------------------------------------------------------  RADIOLOGY:  No results found.  ASSESSMENT AND PLAN:   Active Problems:   Hyperkalemia  1. Hematemesis: on IV PPI and octreotide Monitor hemoglobin GI consultation appreciated, as patient had history of AVM, GI suggesting endoscopy and colonoscopy by GI.  later done, showed some esophagitis and verices, no active bleed.  Polyps on colonoscopy.  2. Acute on chronic blood loss anemia:  Continue ferrous sulfate  Received 1 unit of  blood transfusion during dialysis.  still Hb 7.4- plan to transfuse one more unit today in HD.  3. Hyperkalemia:   emergent dialysis He has been noncompliant with dialysis sessions  Nephrology on case.  Plan for HD today after EGD and Colonoscopy.  4. Chronic diastolic heart failure without signs of exacerbation  5. History of liver cirrhosis: Continue Lasix  6. History of COPD: Continue inhalers  7. Hyperlipidemia: Continue statin  8. CAD/bypass: Continue statin and labetalol   All the records are reviewed and case discussed with Care Management/Social Workerr. Management plans discussed with the patient, family and they are in agreement.  CODE STATUS: full.  TOTAL TIME TAKING CARE OF THIS PATIENT: 35 minutes.    POSSIBLE D/C IN 1-2 DAYS, DEPENDING ON CLINICAL CONDITION.   Vaughan Basta M.D on 05/05/2017   Between 7am to 6pm - Pager - [phone]  After 6pm go to [email or web address] - password [email or web address] [facility]  Office  [phone]  CC: Primary care physician; [name], MD  Note: This dictation was prepared with Dragon dictation along with smaller phrase technology. Any transcriptional errors that result from this process are unintentional.

## 2017-05-05 NOTE — Anesthesia Preprocedure Evaluation (Signed)
Anesthesia Evaluation  Patient identified by MRN, date of birth, ID band Patient awake    Reviewed: Allergy & Precautions, NPO status , Patient's Chart, lab work & pertinent test results  History of Anesthesia Complications Negative for: history of anesthetic complications  Airway Mallampati: III  TM Distance: >3 FB Neck ROM: Full    Dental  (+) Poor Dentition, Missing   Pulmonary neg sleep apnea, COPD,  COPD inhaler, Current Smoker,    breath sounds clear to auscultation- rhonchi (-) wheezing      Cardiovascular hypertension, Pt. on medications + CAD, + CABG (2009), + Peripheral Vascular Disease and +CHF  (-) Cardiac Stents  Rhythm:Regular Rate:Normal - Systolic murmurs and - Diastolic murmurs Echo 0/10/27: - Left ventricle: The cavity size was mildly dilated. There was   mild concentric hypertrophy. Systolic function was normal. The   estimated ejection fraction was in the range of 50% to 55%. Wall   motion was normal; there were no regional wall motion   abnormalities. The study is not technically sufficient to allow   evaluation of LV diastolic function. - Mitral valve: Calcified annulus. Mildly thickened leaflets .   There was moderate to severe regurgitation directed posteriorly. - Left atrium: The atrium was moderately to severely dilated. - Right atrium: The atrium was mildly dilated. - Tricuspid valve: There was moderate regurgitation. - Pulmonary arteries: Systolic pressure was severely increased. PA   peak pressure: 60 mm Hg (S). - Inferior vena cava: The vessel was dilated. The respirophasic   diameter changes were blunted (< 50%), consistent with elevated   central venous pressure.   Neuro/Psych negative neurological ROS  negative psych ROS   GI/Hepatic Neg liver ROS, PUD, GERD  ,  Endo/Other  diabetes  Renal/GU ESRF and DialysisRenal disease (last dialysis 05/04/17)     Musculoskeletal negative  musculoskeletal ROS (+)   Abdominal (+) - obese,   Peds  Hematology  (+) anemia ,   Anesthesia Other Findings Past Medical History: No date: Alcohol abuse No date: CHF (congestive heart failure) ([place]) No date: Cirrhosis ([place]) 2009: Coronary artery disease No date: Diabetic peripheral neuropathy ([facility]) No date: Drug abuse ([place]) NEPHROLOGIST-   DR [facility]  [place]: End stage renal disease on  dialysis ([place])     Comment:  HEMODIALYSIS --   TUES/  THURS/  SAT No date: GERD (gastroesophageal reflux disease) No date: Hyperlipidemia No date: Hypertension No date: PAD (peripheral artery disease) ([facility]) No date: Renal insufficiency     Comment:  Per pt, 32 oz fluid restriction per day 06/09/2016: S/P triple vessel bypass     Comment:  2009ish No date: Suicidal ideation     Comment:  & HOMICIDAL IDEATION --  06-16-2013   ADMITTED TO               BEHAVIOR HEALTH   Reproductive/Obstetrics                             Anesthesia Physical Anesthesia Plan  ASA: IV  Anesthesia Plan: General   Post-op Pain Management:    Induction: Intravenous  PONV Risk Score and Plan: 0 and Propofol infusion  Airway Management Planned: Natural Airway  Additional Equipment:   Intra-op Plan:   Post-operative Plan:   Informed Consent: I have reviewed the patients History and Physical, chart, labs and discussed the procedure including the risks, benefits and alternatives for the proposed anesthesia with the patient or authorized representative  who has indicated his/her understanding and acceptance.   Dental advisory given  Plan Discussed with: CRNA and Anesthesiologist  Anesthesia Plan Comments:         Anesthesia Quick Evaluation

## 2017-05-05 NOTE — Progress Notes (Signed)
[place], [place] 05/05/17  Subjective:  He presents this time via EMS from home with complaints of bright red blood in the vomit and urine. At presentation, his potassium was 7.5.  He was dialyzed urgently yesterday but he did not complete his treatment.   Underwent EGD and colonoscopy with removal of severe al polyps No active blood seen Patient has h/o AVMs Insisting to leave this afternoon without blood transfusion or dialysis  Objective:  Vital signs in last 24 hours:  Temp:  [97.1 F (36.2 C)-98.8 F (37.1 C)] 98.2 F (36.8 C) (02/22 1321) Pulse Rate:  [68-84] 72 (02/22 1321) Resp:  [15-20] 18 (02/22 1321) BP: (86-119)/(61-78) 103/64 (02/22 1321) SpO2:  [93 %-99 %] 99 % (02/22 1321) Weight:  [78 kg (171 lb 15.3 oz)] 78 kg (171 lb 15.3 oz) (02/22 0500)  Weight change:  Filed Weights   05/03/17 0633 05/04/17 0933 05/05/17 0500  Weight: 72.1 kg (159 lb) 77.9 kg (171 lb 11.8 oz) 78 kg (171 lb 15.3 oz)    Intake/Output:    Intake/Output Summary (Last 24 hours) at 05/05/2017 1430 Last data filed at 05/05/2017 1215 Gross per 24 hour  Intake 893.5 ml  Output 0 ml  Net 893.5 ml     Physical Exam: General: NAD laying in bed  HEENT Moist oral mucus membranes  Neck supple  Pulm/lungs Clear bilateral  CVS/Heart regular  Abdomen:  Soft, distended from ascites  Extremities: Trace  Neurologic: Resting quietly  Skin: No acute rashes,   Access: Right arm AV graft       Basic Metabolic Panel:  Recent Labs  Lab 05/03/17 0803 05/03/17 1449 05/04/17 0334 05/04/17 1808 05/05/17 0223  NA 139  --  141  --   --   K 7.5* 5.3* 6.3* 4.5 5.0  CL 92*  --  97*  --   --   CO2 33*  --  30  --   --   GLUCOSE 97  --  105*  --   --   BUN 92*  --  65*  --   --   CREATININE 11.20*  --  8.26*  --   --   CALCIUM 8.3*  --  7.8*  --   --      CBC: Recent Labs  Lab 05/03/17 0803  05/04/17 0334 05/04/17 0831 05/04/17 1808 05/05/17 0118  05/05/17 0946  WBC 7.6  --  6.0  --   --   --   --   NEUTROABS 5.8  --   --   --   --   --   --   HGB 6.3*   < > 6.7* 6.7* 7.1*  7.1* 7.5* 7.2*  HCT 20.5*  --  21.1*  --  22.6*  --   --   MCV 86.4  --  86.8  --   --   --   --   PLT 280  --  218  --   --   --   --    < > = values in this interval not displayed.      Lab Results  Component Value Date   HEPBSAG Negative 03/01/2017   HEPBSAB Reactive 03/01/2017   HEPBIGM Negative 06/01/2016      Microbiology:  Recent Results (from the past 240 hour(s))  MRSA PCR Screening     Status: None   Collection Time: 05/03/17  1:35 PM  Result Value Ref Range Status   MRSA  by PCR NEGATIVE NEGATIVE Final    Comment:        The GeneXpert MRSA Assay (FDA approved for NASAL specimens only), is one component of a comprehensive MRSA colonization surveillance program. It is not intended to diagnose MRSA infection nor to guide or monitor treatment for MRSA infections. Performed at [facility], [address] [place], [place] [address]     Coagulation Studies: Recent Labs    05/03/17 0803  LABPROT 14.5  INR 1.14    Urinalysis: No results for input(s): COLORURINE, LABSPEC, PHURINE, GLUCOSEU, HGBUR, BILIRUBINUR, KETONESUR, PROTEINUR, UROBILINOGEN, NITRITE, LEUKOCYTESUR in the last 72 hours.  Invalid input(s): APPERANCEUR    Imaging: No results found.   Medications:       Assessment/ Plan:  57 y.o. male with end stage renal disease on hemodialysis secondary to Alport's syndrome, ascites, hypertension, anemia of chronic kidney disease, coronary artery disease, peripheral vascular disease, hyperlipidemia, gastrointestinal AVMs, echo in April 2018-> severe pulmonary hypertension, LVH, EF 50-55% moderate to severe mitral regurgitation, left atrial dilation, moderate tricuspid regurgitation likely leading to congestive hepatic insufficiency   [facility] [place] [facility]- no outpatient dialysis unit currently  1.   ESRD 2.  Anemia of chronic kidney disease and GI Bleed 3.  Secondary Hyperparathyroidism 4.  Ascites 5.  Severe Hyperkalemia  Patient's hemoglobin is 7.2.  Blood transfusion is planned with hemodialysis but patient is insisting to leave because he wants to smoke   And states that he will not have a ride after a certain time Patient states he will return for hemodialysis on Monday.  Hemoglobin can be checked at that time and see if he needs transfusion.  Tentatively, he will be dialyzed in the hospital via ER on Monday, Wednesday and Friday second shift.        LOS: [place] 2/22/20192:30 PM  [place], [place]

## 2017-05-05 NOTE — Progress Notes (Signed)
Dr Vira Agar called to inform me that the patient would have his egd, colonoscopy around noon time.  He requested that I feed the patient a clear liquid diet for breakfast then change him back to NPO. This has been done

## 2017-05-08 ENCOUNTER — Ambulatory Visit
Admission: RE | Admit: 2017-05-08 | Discharge: 2017-05-08 | Disposition: A | Discharge status: Home / Self Care | Payer: Medicare Other | Source: Ambulatory Visit | Attending: Nephrology | Admitting: Nephrology

## 2017-05-08 ENCOUNTER — Encounter: Payer: Self-pay | Admitting: Unknown Physician Specialty

## 2017-05-08 DIAGNOSIS — R188 Other ascites: Secondary | ICD-10-CM | POA: Diagnosis not present

## 2017-05-08 LAB — SURGICAL PATHOLOGY

## 2017-05-08 MED ORDER — ALBUMIN HUMAN 25 % IV SOLN
25.0000 g | Freq: Once | INTRAVENOUS | Status: DC
  Filled 2017-05-08: qty 100

## 2017-05-08 MED ORDER — ALBUMIN HUMAN 25 % IV SOLN
INTRAVENOUS | Status: AC
  Administered 2017-05-08: 25 g
  Filled 2017-05-08: qty 100

## 2017-05-08 MED ORDER — ALBUMIN HUMAN 25 % IV SOLN
25.0000 g | Freq: Once | INTRAVENOUS | Status: AC
  Administered 2017-05-08: 25 g via INTRAVENOUS
  Filled 2017-05-08: qty 100

## 2017-05-08 MED ORDER — ALBUMIN HUMAN 25 % IV SOLN
INTRAVENOUS | Status: AC
  Filled 2017-05-08: qty 100

## 2017-05-10 ENCOUNTER — Other Ambulatory Visit: Payer: Self-pay

## 2017-05-10 ENCOUNTER — Ambulatory Visit
Admission: EM | Admit: 2017-05-10 | Discharge: 2017-05-10 | Disposition: A | Discharge status: Home / Self Care | Payer: Medicare Other | Attending: Emergency Medicine | Admitting: Emergency Medicine

## 2017-05-10 DIAGNOSIS — K219 Gastro-esophageal reflux disease without esophagitis: Secondary | ICD-10-CM | POA: Diagnosis not present

## 2017-05-10 DIAGNOSIS — K7460 Unspecified cirrhosis of liver: Secondary | ICD-10-CM | POA: Diagnosis not present

## 2017-05-10 DIAGNOSIS — N186 End stage renal disease: Secondary | ICD-10-CM

## 2017-05-10 DIAGNOSIS — E1142 Type 2 diabetes mellitus with diabetic polyneuropathy: Secondary | ICD-10-CM | POA: Insufficient documentation

## 2017-05-10 DIAGNOSIS — F17210 Nicotine dependence, cigarettes, uncomplicated: Secondary | ICD-10-CM | POA: Insufficient documentation

## 2017-05-10 DIAGNOSIS — I132 Hypertensive heart and chronic kidney disease with heart failure and with stage 5 chronic kidney disease, or end stage renal disease: Secondary | ICD-10-CM | POA: Diagnosis not present

## 2017-05-10 DIAGNOSIS — J449 Chronic obstructive pulmonary disease, unspecified: Secondary | ICD-10-CM | POA: Insufficient documentation

## 2017-05-10 DIAGNOSIS — D123 Benign neoplasm of transverse colon: Secondary | ICD-10-CM | POA: Diagnosis not present

## 2017-05-10 DIAGNOSIS — K921 Melena: Secondary | ICD-10-CM | POA: Insufficient documentation

## 2017-05-10 DIAGNOSIS — D631 Anemia in chronic kidney disease: Secondary | ICD-10-CM | POA: Diagnosis not present

## 2017-05-10 DIAGNOSIS — R188 Other ascites: Secondary | ICD-10-CM | POA: Insufficient documentation

## 2017-05-10 DIAGNOSIS — E1151 Type 2 diabetes mellitus with diabetic peripheral angiopathy without gangrene: Secondary | ICD-10-CM | POA: Diagnosis not present

## 2017-05-10 DIAGNOSIS — Z79899 Other long term (current) drug therapy: Secondary | ICD-10-CM | POA: Insufficient documentation

## 2017-05-10 DIAGNOSIS — Z992 Dependence on renal dialysis: Secondary | ICD-10-CM | POA: Insufficient documentation

## 2017-05-10 DIAGNOSIS — I120 Hypertensive chronic kidney disease with stage 5 chronic kidney disease or end stage renal disease: Secondary | ICD-10-CM | POA: Diagnosis not present

## 2017-05-10 DIAGNOSIS — E785 Hyperlipidemia, unspecified: Secondary | ICD-10-CM | POA: Insufficient documentation

## 2017-05-10 DIAGNOSIS — I509 Heart failure, unspecified: Secondary | ICD-10-CM | POA: Insufficient documentation

## 2017-05-10 DIAGNOSIS — E875 Hyperkalemia: Secondary | ICD-10-CM | POA: Insufficient documentation

## 2017-05-10 DIAGNOSIS — N2581 Secondary hyperparathyroidism of renal origin: Secondary | ICD-10-CM | POA: Diagnosis not present

## 2017-05-10 DIAGNOSIS — I2510 Atherosclerotic heart disease of native coronary artery without angina pectoris: Secondary | ICD-10-CM | POA: Insufficient documentation

## 2017-05-10 DIAGNOSIS — E1122 Type 2 diabetes mellitus with diabetic chronic kidney disease: Secondary | ICD-10-CM | POA: Diagnosis not present

## 2017-05-10 DIAGNOSIS — K2970 Gastritis, unspecified, without bleeding: Secondary | ICD-10-CM | POA: Diagnosis not present

## 2017-05-10 MED ORDER — DIPHENHYDRAMINE HCL 50 MG/ML IJ SOLN
25.0000 mg | Freq: Once | INTRAMUSCULAR | Status: AC
  Administered 2017-05-10: 25 mg via INTRAVENOUS

## 2017-05-10 NOTE — Progress Notes (Signed)
HD tx end, pt signed out ama, goal not met.

## 2017-05-10 NOTE — Progress Notes (Signed)
Post HD assessment. Pt tolerated tx without c/o or complications. Pt requested to terminate tx early r/t not having a ride home. Pt signed out ama. Net UF 683, goal not met,

## 2017-05-10 NOTE — ED Notes (Signed)
The dialysis nurse called to speak with the pt, states he had already called from his cell phone down to dialysis when he arrived and she called back to let him know it would be an hour before they could bring him down. Pt became aggitated and yelled at the nurse on the phone stating "Im leaving, yall are all stupid" pt cussing in the [place], instructed the pt to please watch his language due to small children in the lobby, pt continues to cuss.

## 2017-05-10 NOTE — Progress Notes (Signed)
HD tx start 

## 2017-05-10 NOTE — Progress Notes (Signed)
Post HD assessment  

## 2017-05-10 NOTE — ED Notes (Signed)
Pt transported to dialysis via stretcher by Jonni Sanger, EDT-P.

## 2017-05-10 NOTE — Progress Notes (Signed)
Pre HD assessment  

## 2017-05-10 NOTE — ED Triage Notes (Signed)
Pt is here for routine dialysis. Spoke with the dialysis nurse and pt should go down for dialysis around 1pm. Pt is aware and states he will stay,.Marland Kitchen

## 2017-05-10 NOTE — ED Notes (Signed)
Prior to patient being taken to dialysis, pt was visualized walking down the hallway loudly stating that he was leaving. This RN, Opal Sidles, RN and Tiffany, RN, and Dr. Clearnce Hasten spoke with patient. Dialysis then called to get report on the patient, this RN explained to patient that dialysis was ready for patient, pt states, "they don't know the hell they are doing down there, they are slow as shit!". Pt was convinced to go back to his bed by this RN so that he could go downstairs to be dialyzed. This RN spoke with patient regarding his frustrations. Pt states, "I don't give a damn, half them people down there are dead anyway, why can't I go first? The doctor told me to be here at 1 and I would be put on my machine!" This RN explained that patient would be given his dialysis, however emergencies had to go first, pt responded with, "hell they are already half dead, they can't walk, what's the point? They might as well go ahead and put them in the ground!"

## 2017-05-10 NOTE — ED Notes (Signed)
NAD noted at time of D/C. Pt denies questions or concerns. Pt ambulatory to the lobby at this time.  

## 2017-05-10 NOTE — Progress Notes (Signed)
[facility]  ROUNDING NOTE   Subjective:   Seen and examined on hemodialysis.     HEMODIALYSIS FLOWSHEET:  Blood Flow Rate (mL/min): 400 mL/min Arterial Pressure (mmHg): -160 mmHg Venous Pressure (mmHg): 270 mmHg Transmembrane Pressure (mmHg): 50 mmHg Ultrafiltration Rate (mL/min): 720 mL/min Dialysate Flow Rate (mL/min): 600 ml/min Conductivity: Machine : 14.4 Conductivity: Machine : 14.4 Dialysis Fluid Bolus: Normal Saline Bolus Amount (mL): 250 mL    Objective:  Vital signs in last 24 hours:  Temp:  [98.2 F (36.8 C)-98.8 F (37.1 C)] 98.2 F (36.8 C) (02/27 1705) Pulse Rate:  [78-86] 83 (02/27 1705) Resp:  [15-22] 20 (02/27 1705) BP: (108-129)/(60-96) 124/71 (02/27 1705) SpO2:  [99 %-100 %] 100 % (02/27 1705) Weight:  [71.7 kg (158 lb 1.1 oz)-72.1 kg (159 lb)] 71.7 kg (158 lb 1.1 oz) (02/27 1637)  Weight change:  Filed Weights   05/10/17 1223 05/10/17 1420 05/10/17 1637  Weight: 72.1 kg (159 lb) 72.1 kg (159 lb) 71.7 kg (158 lb 1.1 oz)    Intake/Output: No intake/output data recorded.   Intake/Output this shift:  Total I/O In: -  Out: 683 [Other:683]  Physical Exam: General: NAD,   Head: Normocephalic, atraumatic. Moist oral mucosal membranes  Eyes: Anicteric, PERRL  Neck: Supple, trachea midline  Lungs:  Clear to auscultation  Heart: Regular rate and rhythm  Abdomen:  Soft, nontender,   Extremities: no peripheral edema.  Neurologic: Nonfocal, moving all four extremities  Skin: No lesions  Access: AVG right    Basic Metabolic Panel: Recent Labs  Lab 05/04/17 0334 05/04/17 1808 05/05/17 0223  NA 141  --   --   K 6.3* 4.5 5.0  CL 97*  --   --   CO2 30  --   --   GLUCOSE 105*  --   --   BUN 65*  --   --   CREATININE 8.26*  --   --   CALCIUM 7.8*  --   --     Liver Function Tests: No results for input(s): AST, ALT, ALKPHOS, BILITOT, PROT, ALBUMIN in the last 168 hours. No results for input(s): LIPASE, AMYLASE in the last  168 hours. No results for input(s): AMMONIA in the last 168 hours.  CBC: Recent Labs  Lab 05/04/17 0334 05/04/17 0831 05/04/17 1808 05/05/17 0118 05/05/17 0946  WBC 6.0  --   --   --   --   HGB 6.7* 6.7* 7.1*  7.1* 7.5* 7.2*  HCT 21.1*  --  22.6*  --   --   MCV 86.8  --   --   --   --   PLT 218  --   --   --   --     Cardiac Enzymes: No results for input(s): CKTOTAL, CKMB, CKMBINDEX, TROPONINI in the last 168 hours.  BNP: Invalid input(s): POCBNP  CBG: No results for input(s): GLUCAP in the last 168 hours.  Microbiology: Results for orders placed or performed during the hospital encounter of 05/03/17  MRSA PCR Screening     Status: None   Collection Time: 05/03/17  1:35 PM  Result Value Ref Range Status   MRSA by PCR NEGATIVE NEGATIVE Final    Comment:        The GeneXpert MRSA Assay (FDA approved for NASAL specimens only), is one component of a comprehensive MRSA colonization surveillance program. It is not intended to diagnose MRSA infection nor to guide or monitor treatment for MRSA infections. Performed at  [address] [place], [place] [address]     Coagulation Studies: No results for input(s): LABPROT, INR in the last 72 hours.  Urinalysis: No results for input(s): COLORURINE, LABSPEC, PHURINE, GLUCOSEU, HGBUR, BILIRUBINUR, KETONESUR, PROTEINUR, UROBILINOGEN, NITRITE, LEUKOCYTESUR in the last 72 hours.  Invalid input(s): APPERANCEUR    Imaging: No results found.   Medications:       Assessment/ Plan:  [name] is a 57 y.o. black male with end stage renal disease on hemodialysis secondary to Alport's syndrome, ascites, hypertension, hepatic cirrhosis, coronary artery disease, peripheral vascular disease, hyperlipidemia, gastrointestinal AVMs, severe pulmonary hypertension,   [facility] [place] [facility]- nooutpatientdialysis unit currently  1.  ESRD 2.  Anemia of chronic kidney disease and GI  Bleed 3.  Secondary Hyperparathyroidism 4.  Ascites 5.  Hyperkalemia       LOS: 0 Stanley [name] 2/27/20195:43 PM

## 2017-05-10 NOTE — ED Provider Notes (Signed)
Presents after dialysis.  Patient asymptomatic and stable for discharge home.   Merlyn Lot, MD 05/10/17 1655

## 2017-05-10 NOTE — ED Provider Notes (Signed)
[facility] Emergency Department Provider Note  ___________________________________________   First MD Initiated Contact with Patient 05/10/17 1226     (approximate)  I have reviewed the triage vital signs and the nursing notes.   HISTORY  Chief Complaint Dialysis   HPI Stanley [name] is a 57 y.o. male with a history of cirrhosis as well as end-stage renal disease dialysis who is presenting for dialysis.  He denies any pain or shortness of breath.  Says that he does not remember if he was here on Monday or not.  It appears the patient was recently admitted and discharged AMA.  Last visit was the 22nd.  Past Medical History:  Diagnosis Date  . Alcohol abuse   . CHF (congestive heart failure) ([place])   . Cirrhosis ([place])   . Coronary artery disease 2009  . Diabetic peripheral neuropathy ([place])   . Drug abuse ([place])   . End stage renal disease on dialysis [facility]) NEPHROLOGIST-   DR [facility]  IN [place]   HEMODIALYSIS --   TUES/  THURS/  SAT  . GERD (gastroesophageal reflux disease)   . Hyperlipidemia   . Hypertension   . PAD (peripheral artery disease) ([place])   . Renal insufficiency    Per pt, 32 oz fluid restriction per day  . S/P triple vessel bypass 06/09/2016   2009ish  . Suicidal ideation    & HOMICIDAL IDEATION --  06-16-2013   ADMITTED TO BEHAVIOR HEALTH    Patient Active Problem List   Diagnosis Date Noted  . ESRD (end stage renal disease) ([place]) 04/28/2017  . Uremia 03/08/2017  . Hemodialysis patient ([place]) 03/03/2017  . Weakness 02/28/2017  . Hypocalcemia 02/22/2017  . Shortness of breath 11/26/2016  . COPD (chronic obstructive pulmonary disease) ([place]) 10/30/2016  . COPD exacerbation ([place]) 10/29/2016  . Anemia   . Heme positive stool   . Ulceration of intestine   . Benign neoplasm of transverse colon   . Acute gastrointestinal hemorrhage   . Esophageal candidiasis ([place])   . Angiodysplasia of intestinal tract   . Acute respiratory  failure with hypoxia ([place]) 07/03/2016  . GI bleeding 06/24/2016  . Rectal bleeding 06/14/2016  . Anemia of chronic disease 06/01/2016  . MRSA carrier 06/01/2016  . Chronic renal failure 05/23/2016  . Ischemic heart disease 05/23/2016  . Angiodysplasia of small intestine   . Melena   . Small bowel bleed not requiring more than 4 units of blood in 24 hours, ICU, or surgery   . Anemia due to chronic blood loss   . Abdominal pain 05/05/2016  . Acute posthemorrhagic anemia 04/17/2016  . Gastrointestinal bleed 04/17/2016  . History of esophagogastroduodenoscopy (EGD) 04/17/2016  . Elevated troponin 04/17/2016  . Alcohol abuse 04/17/2016  . Upper GI bleed 01/19/2016  . Blood in stool   . Angiodysplasia of stomach and duodenum with hemorrhage   . Gastritis   . Esophagitis, unspecified   . GI bleed 05/16/2015  . Acute GI bleeding   . Symptomatic anemia 04/30/2015  . HTN (hypertension) 04/06/2015  . GERD (gastroesophageal reflux disease) 04/06/2015  . HLD (hyperlipidemia) 04/06/2015  . Dyspnea 04/06/2015  . Cirrhosis of liver with ascites ([place]) 04/06/2015  . Ascites 04/06/2015  . GIB (gastrointestinal bleeding) 03/23/2015  . Homicidal ideation 06/19/2013  . Suicidal intent 06/19/2013  . Homicidal ideations 06/19/2013  . Hyperkalemia 06/16/2013  . Mandible fracture ([place]) 06/05/2013  . Fracture, mandible ([place]) 06/02/2013  . Coronary atherosclerosis of native coronary artery 06/02/2013  .  ESRD on dialysis ([place]) 06/02/2013  . Mandible open fracture ([place]) 06/02/2013    Past Surgical History:  Procedure Laterality Date  . AGILE CAPSULE N/A 06/19/2016   Procedure: AGILE CAPSULE;  Surgeon: [name], MD;  Location: [facility] ENDOSCOPY;  Service: Endoscopy;  Laterality: N/A;  . COLONOSCOPY WITH PROPOFOL N/A 06/18/2016   Procedure: COLONOSCOPY WITH PROPOFOL;  Surgeon: [name], MD;  Location: [facility] ENDOSCOPY;  Service: Endoscopy;  Laterality: N/A;  . COLONOSCOPY WITH PROPOFOL N/A 08/12/2016    Procedure: COLONOSCOPY WITH PROPOFOL;  Surgeon: [name], MD;  Location: [facility] ENDOSCOPY;  Service: Endoscopy;  Laterality: N/A;  . COLONOSCOPY WITH PROPOFOL N/A 05/05/2017   Procedure: COLONOSCOPY WITH PROPOFOL;  Surgeon: Manya Silvas, MD;  Location: [facility] ENDOSCOPY;  Service: Endoscopy;  Laterality: N/A;  . CORONARY ANGIOPLASTY  ?   PT UNABLE TO TELL IF  BEFORE OR AFTER  CABG  . CORONARY ARTERY BYPASS GRAFT  2008  ([place] , [place])   3 VESSEL  . DIALYSIS FISTULA CREATION  LAST SURGERY  APPOX  2008  . ENTEROSCOPY N/A 05/10/2016   Procedure: ENTEROSCOPY;  Surgeon: Jerene Bears, MD;  Location: [place];  Service: Gastroenterology;  Laterality: N/A;  . ENTEROSCOPY N/A 08/12/2016   Procedure: ENTEROSCOPY;  Surgeon: [name], MD;  Location: [facility] ENDOSCOPY;  Service: Endoscopy;  Laterality: N/A;  . ESOPHAGOGASTRODUODENOSCOPY N/A 05/07/2015   Procedure: ESOPHAGOGASTRODUODENOSCOPY (EGD);  Surgeon: Hulen Luster, MD;  Location: [facility] ENDOSCOPY;  Service: Endoscopy;  Laterality: N/A;  . ESOPHAGOGASTRODUODENOSCOPY (EGD) WITH PROPOFOL N/A 05/17/2015   Procedure: ESOPHAGOGASTRODUODENOSCOPY (EGD) WITH PROPOFOL;  Surgeon: [name], MD;  Location: [facility] ENDOSCOPY;  Service: Endoscopy;  Laterality: N/A;  . ESOPHAGOGASTRODUODENOSCOPY (EGD) WITH PROPOFOL N/A 01/20/2016   Procedure: ESOPHAGOGASTRODUODENOSCOPY (EGD) WITH PROPOFOL;  Surgeon: [name], MD;  Location: [facility] ENDOSCOPY;  Service: Endoscopy;  Laterality: N/A;  . ESOPHAGOGASTRODUODENOSCOPY (EGD) WITH PROPOFOL N/A 04/17/2016   Procedure: ESOPHAGOGASTRODUODENOSCOPY (EGD) WITH PROPOFOL;  Surgeon: Lin Landsman, MD;  Location: [facility] ENDOSCOPY;  Service: Gastroenterology;  Laterality: N/A;  . ESOPHAGOGASTRODUODENOSCOPY (EGD) WITH PROPOFOL  05/09/2016   Procedure: ESOPHAGOGASTRODUODENOSCOPY (EGD) WITH PROPOFOL;  Surgeon: Jerene Bears, MD;  Location: [place];  Service: Endoscopy;;  . ESOPHAGOGASTRODUODENOSCOPY (EGD) WITH PROPOFOL N/A 06/16/2016    Procedure: ESOPHAGOGASTRODUODENOSCOPY (EGD) WITH PROPOFOL;  Surgeon: [name], MD;  Location: [facility] ENDOSCOPY;  Service: Endoscopy;  Laterality: N/A;  . ESOPHAGOGASTRODUODENOSCOPY (EGD) WITH PROPOFOL N/A 05/05/2017   Procedure: ESOPHAGOGASTRODUODENOSCOPY (EGD) WITH PROPOFOL;  Surgeon: Manya Silvas, MD;  Location: [facility] ENDOSCOPY;  Service: Endoscopy;  Laterality: N/A;  . GIVENS CAPSULE STUDY N/A 05/07/2016   Procedure: GIVENS CAPSULE STUDY;  Surgeon: Doran Stabler, MD;  Location: [place];  Service: Endoscopy;  Laterality: N/A;  . MANDIBULAR HARDWARE REMOVAL N/A 07/29/2013   Procedure: REMOVAL OF ARCH BARS;  Surgeon: Theodoro Kos, DO;  Location: [place];  Service: Plastics;  Laterality: N/A;  . ORIF MANDIBULAR FRACTURE N/A 06/05/2013   Procedure: REPAIR OF MANDIBULAR FRACTURE x 2 with maxillo-mandibular fixation ;  Surgeon: Theodoro Kos, DO;  Location: [facility];  Service: Plastics;  Laterality: N/A;  . PERIPHERAL ARTERIAL STENT GRAFT Left     Prior to Admission medications   Medication Sig Start Date End Date Taking? Authorizing Provider  albuterol (PROVENTIL HFA;VENTOLIN HFA) 108 (90 Base) MCG/ACT inhaler Inhale 2 puffs into the lungs every 6 (six) hours as needed for wheezing or shortness of breath. 04/21/17   Lisa Roca, MD  [place] hydroxide-simeth (MAALOX/MYLANTA) [phone]  MG/5ML suspension Take 15 mLs every 4 (four) hours as needed by mouth for indigestion or heartburn. Patient not taking: Reported on 04/19/2017 01/25/17   Demetrios Loll, MD  atorvastatin (LIPITOR) 40 MG tablet Take 1 tablet (40 mg total) by mouth daily. 11/16/16   Vaughan Basta, MD  budesonide-formoterol (SYMBICORT) 160-4.5 MCG/ACT inhaler Inhale 2 puffs into the lungs daily. 11/16/16   Vaughan Basta, MD  calcium acetate (PHOSLO) 667 MG capsule Take 2,001 mg by mouth 3 (three) times daily.    [provider]  ferrous sulfate 325 (65 FE) MG tablet Take 1 tablet by mouth 3  (three) times daily. 01/21/17   [provider]  furosemide (LASIX) 80 MG tablet Take 1 tablet (80 mg total) daily by mouth. 01/25/17   Demetrios Loll, MD  gabapentin (NEURONTIN) 300 MG capsule Take 1 capsule by mouth daily as directed 08/16/16   [provider]  ipratropium-albuterol (DUONEB) 0.5-2.5 (3) MG/3ML SOLN Take 3 mLs by nebulization every 6 (six) hours as needed. 11/28/16   Loletha Grayer, MD  labetalol (NORMODYNE) 100 MG tablet Take 100 mg by mouth daily. 03/24/17   [provider]  lactulose (CHRONULAC) 10 GM/15ML solution Take 30 mLs (20 g total) by mouth 3 (three) times daily. Patient not taking: Reported on 04/05/2017 01/11/17   Bettey Costa, MD  Multiple Vitamins-Minerals-FA (DIALYVITE SUPREME D) 3 MG TABS Take 1 tablet (3 mg total) by mouth daily. 11/28/16   Loletha Grayer, MD  nitroGLYCERIN (NITROSTAT) 0.4 MG SL tablet Place 1 tablet (0.4 mg total) under the tongue every 5 (five) minutes as needed. 04/12/16   Wende Bushy, MD  pantoprazole (PROTONIX) 40 MG tablet Take 1 tablet (40 mg total) by mouth daily. 11/28/16   Loletha Grayer, MD  Spacer/Aero Chamber Mouthpiece MISC 1 Units by Does not apply route every 4 (four) hours as needed (wheezing). 02/19/17   Darel Hong, MD  tiotropium (SPIRIVA HANDIHALER) 18 MCG inhalation capsule Place 1 capsule (18 mcg total) into inhaler and inhale daily. 11/16/16 11/16/17  Vaughan Basta, MD    Allergies Patient has no known allergies.  Family History  Problem Relation Age of Onset  . Colon cancer Mother   . Cancer Father   . Cancer Sister   . Kidney disease Brother     Social History Social History   Tobacco Use  . Smoking status: Current Every Day Smoker    Packs/day: 0.15    Years: 40.00    Pack years: 6.00    Types: Cigarettes  . Smokeless tobacco: Never Used  Substance Use Topics  . Alcohol use: No    Comment: pt reports quitting after learning about cirrhosis  . Drug use: No    Review of  Systems  Constitutional: No fever/chills Eyes: No visual changes. ENT: No sore throat. Cardiovascular: Denies chest pain. Respiratory: Denies shortness of breath. Gastrointestinal: No abdominal pain.  No nausea, no vomiting.  No diarrhea.  No constipation. Genitourinary: Negative for dysuria. Musculoskeletal: Negative for back pain. Skin: Negative for rash. Neurological: Negative for headaches, focal weakness or numbness.   ____________________________________________   PHYSICAL EXAM:  VITAL SIGNS: ED Triage Vitals  Enc Vitals Group     BP 05/10/17 1221 112/71     Pulse Rate 05/10/17 1221 78     Resp 05/10/17 1221 18     Temp --      Temp Source 05/10/17 1221 Oral     SpO2 05/10/17 1221 100 %     Weight 05/10/17 1223  159 lb (72.1 kg)     Height 05/10/17 1223 6\' 3"  (1.905 m)     Head Circumference --      Peak Flow --      Pain Score 05/10/17 1225 0     Pain Loc --      Pain Edu? --      Excl. in [place]? --     Constitutional: Alert and oriented. Well appearing and in no acute distress. Eyes: Conjunctivae are normal.  Head: Atraumatic. Nose: No congestion/rhinnorhea. Mouth/Throat: Mucous membranes are moist.  Neck: No stridor.   Cardiovascular: Normal rate, regular rhythm. Grossly normal heart sounds.  Good peripheral circulation with palpable thrill to the right upper extremity fistula.   Respiratory: Normal respiratory effort.  No retractions. Lungs CTAB. Gastrointestinal: Soft and nontender. No distention.  Musculoskeletal: No lower extremity tenderness nor edema.  No joint effusions. Neurologic:  Normal speech and language. No gross focal neurologic deficits are appreciated. Skin:  Skin is warm, dry and intact. No rash noted. Psychiatric: Mood and affect are normal. Speech and behavior are normal.  ____________________________________________   LABS (all labs ordered are listed, but only abnormal results are displayed)  Labs Reviewed - No data to  display ____________________________________________  EKG   ____________________________________________  RADIOLOGY   ____________________________________________   PROCEDURES  Procedure(s) performed:   Procedures  Critical Care performed:   ____________________________________________   INITIAL IMPRESSION / ASSESSMENT AND PLAN / ED COURSE  Pertinent labs & imaging results that were available during my care of the patient were reviewed by me and considered in my medical decision making (see chart for details).  DDX: Electrolyte abnormality, hyperkalemia, visit for dialysis As part of my medical decision making, I reviewed the following data within the [place] chart reviewed  Patient to be dialyzed.     ____________________________________________   FINAL CLINICAL IMPRESSION(S) / ED DIAGNOSES  Visit for dialysis.    NEW MEDICATIONS STARTED DURING THIS VISIT:  New Prescriptions   No medications on file     Note:  This document was prepared using Dragon voice recognition software and may include unintentional dictation errors.     Orbie Pyo, MD 05/10/17 1239

## 2017-05-11 LAB — PATHOLOGY

## 2017-05-15 ENCOUNTER — Other Ambulatory Visit: Payer: Self-pay

## 2017-05-15 ENCOUNTER — Emergency Department
Admission: EM | Admit: 2017-05-15 | Discharge: 2017-05-15 | Discharge status: Against Medical Advice | Payer: Medicare Other | Attending: Emergency Medicine | Admitting: Emergency Medicine

## 2017-05-15 ENCOUNTER — Encounter: Payer: Self-pay | Admitting: Emergency Medicine

## 2017-05-15 DIAGNOSIS — Z992 Dependence on renal dialysis: Secondary | ICD-10-CM | POA: Insufficient documentation

## 2017-05-15 DIAGNOSIS — N186 End stage renal disease: Secondary | ICD-10-CM | POA: Diagnosis not present

## 2017-05-15 DIAGNOSIS — J449 Chronic obstructive pulmonary disease, unspecified: Secondary | ICD-10-CM | POA: Diagnosis not present

## 2017-05-15 DIAGNOSIS — I509 Heart failure, unspecified: Secondary | ICD-10-CM | POA: Insufficient documentation

## 2017-05-15 DIAGNOSIS — F17210 Nicotine dependence, cigarettes, uncomplicated: Secondary | ICD-10-CM | POA: Insufficient documentation

## 2017-05-15 DIAGNOSIS — I132 Hypertensive heart and chronic kidney disease with heart failure and with stage 5 chronic kidney disease, or end stage renal disease: Secondary | ICD-10-CM | POA: Diagnosis not present

## 2017-05-15 DIAGNOSIS — E878 Other disorders of electrolyte and fluid balance, not elsewhere classified: Secondary | ICD-10-CM | POA: Diagnosis not present

## 2017-05-15 DIAGNOSIS — E785 Hyperlipidemia, unspecified: Secondary | ICD-10-CM | POA: Diagnosis not present

## 2017-05-15 DIAGNOSIS — I2510 Atherosclerotic heart disease of native coronary artery without angina pectoris: Secondary | ICD-10-CM | POA: Diagnosis not present

## 2017-05-15 DIAGNOSIS — I120 Hypertensive chronic kidney disease with stage 5 chronic kidney disease or end stage renal disease: Secondary | ICD-10-CM | POA: Diagnosis not present

## 2017-05-15 MED ORDER — LIDOCAINE HCL (PF) 1 % IJ SOLN: 5.0000 mL | INTRAMUSCULAR | Status: DC | PRN

## 2017-05-15 MED ORDER — SODIUM CHLORIDE 0.9 % IV SOLN: 100.0000 mL | INTRAVENOUS | Status: DC | PRN

## 2017-05-15 MED ORDER — ALTEPLASE 2 MG IJ SOLR
2.0000 mg | Freq: Once | INTRAMUSCULAR | Status: DC | PRN
  Filled 2017-05-15: qty 2

## 2017-05-15 MED ORDER — LIDOCAINE-PRILOCAINE 2.5-2.5 % EX CREA: 1.0000 "application " | TOPICAL_CREAM | CUTANEOUS | Status: DC | PRN

## 2017-05-15 MED ORDER — PENTAFLUOROPROP-TETRAFLUOROETH EX AERO: 1.0000 "application " | INHALATION_SPRAY | CUTANEOUS | Status: DC | PRN

## 2017-05-15 MED ORDER — HEPARIN SODIUM (PORCINE) 1000 UNIT/ML DIALYSIS
1000.0000 [IU] | INTRAMUSCULAR | Status: DC | PRN
  Filled 2017-05-15: qty 1

## 2017-05-15 NOTE — ED Triage Notes (Signed)
Patient states gets dialysis here. Has been doing so Mon, wed and Friday for 2 months. Patient noted eating bag of chips on arrival.

## 2017-05-15 NOTE — ED Notes (Signed)
Pt gets up and states, "Fuck it, that fat bitch down stairs needs to hurry up, they need to fire her, Im not waiting."  Megan, RN attempted to have pt sign out AMA, pt refused, "I aint signing shit."  Pt proceeds to walk out at this time.

## 2017-05-15 NOTE — ED Provider Notes (Addendum)
[facility] Emergency Department Provider Note       Time seen: ----------------------------------------- 12:55 PM on 05/15/2017 -----------------------------------------   I have reviewed the triage vital signs and the nursing notes.  HISTORY   Chief Complaint dialysis    HPI Stanley [name] is a 57 y.o. male with a history of alcohol abuse, CHF, cirrhosis, coronary artery disease, drug abuse, end-stage renal disease on dialysis who presents to the ED for triweekly dialysis treatments.  Patient has no complaints.  Patient states he has been receiving dialysis as scheduled.  Past Medical History:  Diagnosis Date  . Alcohol abuse   . CHF (congestive heart failure) ([place])   . Cirrhosis ([place])   . Coronary artery disease 2009  . Diabetic peripheral neuropathy ([place])   . Drug abuse ([place])   . End stage renal disease on dialysis [facility]) NEPHROLOGIST-   DR [facility]  IN [place]   HEMODIALYSIS --   TUES/  THURS/  SAT  . GERD (gastroesophageal reflux disease)   . Hyperlipidemia   . Hypertension   . PAD (peripheral artery disease) ([place])   . Renal insufficiency    Per pt, 32 oz fluid restriction per day  . S/P triple vessel bypass 06/09/2016   2009ish  . Suicidal ideation    & HOMICIDAL IDEATION --  06-16-2013   ADMITTED TO BEHAVIOR HEALTH    Patient Active Problem List   Diagnosis Date Noted  . ESRD (end stage renal disease) ([place]) 04/28/2017  . Uremia 03/08/2017  . Hemodialysis patient ([place]) 03/03/2017  . Weakness 02/28/2017  . Hypocalcemia 02/22/2017  . Shortness of breath 11/26/2016  . COPD (chronic obstructive pulmonary disease) ([place]) 10/30/2016  . COPD exacerbation ([place]) 10/29/2016  . Anemia   . Heme positive stool   . Ulceration of intestine   . Benign neoplasm of transverse colon   . Acute gastrointestinal hemorrhage   . Esophageal candidiasis ([place])   . Angiodysplasia of intestinal tract   . Acute respiratory failure with hypoxia ([place])  07/03/2016  . GI bleeding 06/24/2016  . Rectal bleeding 06/14/2016  . Anemia of chronic disease 06/01/2016  . MRSA carrier 06/01/2016  . Chronic renal failure 05/23/2016  . Ischemic heart disease 05/23/2016  . Angiodysplasia of small intestine   . Melena   . Small bowel bleed not requiring more than 4 units of blood in 24 hours, ICU, or surgery   . Anemia due to chronic blood loss   . Abdominal pain 05/05/2016  . Acute posthemorrhagic anemia 04/17/2016  . Gastrointestinal bleed 04/17/2016  . History of esophagogastroduodenoscopy (EGD) 04/17/2016  . Elevated troponin 04/17/2016  . Alcohol abuse 04/17/2016  . Upper GI bleed 01/19/2016  . Blood in stool   . Angiodysplasia of stomach and duodenum with hemorrhage   . Gastritis   . Esophagitis, unspecified   . GI bleed 05/16/2015  . Acute GI bleeding   . Symptomatic anemia 04/30/2015  . HTN (hypertension) 04/06/2015  . GERD (gastroesophageal reflux disease) 04/06/2015  . HLD (hyperlipidemia) 04/06/2015  . Dyspnea 04/06/2015  . Cirrhosis of liver with ascites ([place]) 04/06/2015  . Ascites 04/06/2015  . GIB (gastrointestinal bleeding) 03/23/2015  . Homicidal ideation 06/19/2013  . Suicidal intent 06/19/2013  . Homicidal ideations 06/19/2013  . Hyperkalemia 06/16/2013  . Mandible fracture ([place]) 06/05/2013  . Fracture, mandible ([place]) 06/02/2013  . Coronary atherosclerosis of native coronary artery 06/02/2013  . ESRD on dialysis ([place]) 06/02/2013  . Mandible open fracture ([place]) 06/02/2013    Past  Surgical History:  Procedure Laterality Date  . AGILE CAPSULE N/A 06/19/2016   Procedure: AGILE CAPSULE;  Surgeon: [name], MD;  Location: [facility] ENDOSCOPY;  Service: Endoscopy;  Laterality: N/A;  . COLONOSCOPY WITH PROPOFOL N/A 06/18/2016   Procedure: COLONOSCOPY WITH PROPOFOL;  Surgeon: [name], MD;  Location: [facility] ENDOSCOPY;  Service: Endoscopy;  Laterality: N/A;  . COLONOSCOPY WITH PROPOFOL N/A 08/12/2016   Procedure: COLONOSCOPY WITH  PROPOFOL;  Surgeon: [name], MD;  Location[facility] ENDOSCOPY;  Service: Endoscopy;  Laterality: N/A;  . COLONOSCOPY WITH PROPOFOL N/A 05/05/2017   Procedure: COLONOSCOPY WITH PROPOFOL;  Surgeon: Manya Silvas, MD;  Location: [facility] ENDOSCOPY;  Service: Endoscopy;  Laterality: N/A;  . CORONARY ANGIOPLASTY  ?   PT UNABLE TO TELL IF  BEFORE OR AFTER  CABG  . CORONARY ARTERY BYPASS GRAFT  2008  ([place])   3 VESSEL  . DIALYSIS FISTULA CREATION  LAST SURGERY  APPOX  2008  . ENTEROSCOPY N/A 05/10/2016   Procedure: ENTEROSCOPY;  Surgeon: Jerene Bears, MD;  Location: [place];  Service: Gastroenterology;  Laterality: N/A;  . ENTEROSCOPY N/A 08/12/2016   Procedure: ENTEROSCOPY;  Surgeon: [name], MD;  Location: [facility] ENDOSCOPY;  Service: Endoscopy;  Laterality: N/A;  . ESOPHAGOGASTRODUODENOSCOPY N/A 05/07/2015   Procedure: ESOPHAGOGASTRODUODENOSCOPY (EGD);  Surgeon: Hulen Luster, MD;  Location: [facility] ENDOSCOPY;  Service: Endoscopy;  Laterality: N/A;  . ESOPHAGOGASTRODUODENOSCOPY (EGD) WITH PROPOFOL N/A 05/17/2015   Procedure: ESOPHAGOGASTRODUODENOSCOPY (EGD) WITH PROPOFOL;  Surgeon: [name], MD;  Location: [facility] ENDOSCOPY;  Service: Endoscopy;  Laterality: N/A;  . ESOPHAGOGASTRODUODENOSCOPY (EGD) WITH PROPOFOL N/A 01/20/2016   Procedure: ESOPHAGOGASTRODUODENOSCOPY (EGD) WITH PROPOFOL;  Surgeon: [name], MD;  Location: [facility] ENDOSCOPY;  Service: Endoscopy;  Laterality: N/A;  . ESOPHAGOGASTRODUODENOSCOPY (EGD) WITH PROPOFOL N/A 04/17/2016   Procedure: ESOPHAGOGASTRODUODENOSCOPY (EGD) WITH PROPOFOL;  Surgeon: Lin Landsman, MD;  Location: [facility] ENDOSCOPY;  Service: Gastroenterology;  Laterality: N/A;  . ESOPHAGOGASTRODUODENOSCOPY (EGD) WITH PROPOFOL  05/09/2016   Procedure: ESOPHAGOGASTRODUODENOSCOPY (EGD) WITH PROPOFOL;  Surgeon: Jerene Bears, MD;  Location: [place];  Service: Endoscopy;;  . ESOPHAGOGASTRODUODENOSCOPY (EGD) WITH PROPOFOL N/A 06/16/2016   Procedure:  ESOPHAGOGASTRODUODENOSCOPY (EGD) WITH PROPOFOL;  Surgeon: [name], MD;  Location: [facility] ENDOSCOPY;  Service: Endoscopy;  Laterality: N/A;  . ESOPHAGOGASTRODUODENOSCOPY (EGD) WITH PROPOFOL N/A 05/05/2017   Procedure: ESOPHAGOGASTRODUODENOSCOPY (EGD) WITH PROPOFOL;  Surgeon: Manya Silvas, MD;  Location: [facility] ENDOSCOPY;  Service: Endoscopy;  Laterality: N/A;  . GIVENS CAPSULE STUDY N/A 05/07/2016   Procedure: GIVENS CAPSULE STUDY;  Surgeon: Doran Stabler, MD;  Location: [place];  Service: Endoscopy;  Laterality: N/A;  . MANDIBULAR HARDWARE REMOVAL N/A 07/29/2013   Procedure: REMOVAL OF ARCH BARS;  Surgeon: Theodoro Kos, DO;  Location: [place];  Service: Plastics;  Laterality: N/A;  . ORIF MANDIBULAR FRACTURE N/A 06/05/2013   Procedure: REPAIR OF MANDIBULAR FRACTURE x 2 with maxillo-mandibular fixation ;  Surgeon: Theodoro Kos, DO;  Location: [facility];  Service: Plastics;  Laterality: N/A;  . PERIPHERAL ARTERIAL STENT GRAFT Left     Allergies Patient has no known allergies.  Social History Social History   Tobacco Use  . Smoking status: Current Every Day Smoker    Packs/day: 0.15    Years: 40.00    Pack years: 6.00    Types: Cigarettes  . Smokeless tobacco: Never Used  Substance Use Topics  . Alcohol use: No    Comment: pt reports quitting after learning about cirrhosis  .  Drug use: No    Review of Systems Constitutional: Negative for fever. Cardiovascular: Negative for chest pain. Respiratory: Negative for shortness of breath. Gastrointestinal: Negative for abdominal pain, vomiting and diarrhea. Musculoskeletal: Negative for back pain. Neurological: Negative for headaches, focal weakness or numbness.  All systems negative/normal/unremarkable except as stated in the HPI  ____________________________________________   PHYSICAL EXAM:  VITAL SIGNS: ED Triage Vitals  Enc Vitals Group     BP 05/15/17 1149 130/78     Pulse Rate 05/15/17 1149 80      Resp 05/15/17 1149 20     Temp 05/15/17 1149 98.4 F (36.9 C)     Temp Source 05/15/17 1149 Oral     SpO2 05/15/17 1149 100 %     Weight 05/15/17 1150 158 lb (71.7 kg)     Height 05/15/17 1150 6\' 3"  (1.905 m)     Head Circumference --      Peak Flow --      Pain Score 05/15/17 1149 0     Pain Loc --      Pain Edu? --      Excl. in [place]? --    Constitutional: Alert and oriented. Well appearing and in no distress. Eyes: Conjunctivae are normal. Normal extraocular movements. Cardiovascular: Normal rate, regular rhythm. No murmurs, rubs, or gallops. Respiratory: Normal respiratory effort without tachypnea nor retractions. Breath sounds are clear and equal bilaterally. No wheezes/rales/rhonchi. Gastrointestinal: Soft and nontender. Normal bowel sounds Musculoskeletal: Nontender with normal range of motion in extremities. No lower extremity tenderness nor edema. Neurologic:  Normal speech and language. No gross focal neurologic deficits are appreciated.  Skin:  Skin is warm, dry and intact. No rash noted. Psychiatric: Mood and affect are normal. Speech and behavior are normal.  ____________________________________________  ED COURSE:  As part of my medical decision making, I reviewed the following data within the [place] History obtained from family if available, nursing notes, old chart and ekg, as well as notes from prior ED visits. Patient presented for dialysis, he appears medically stable for dialysis at this time as scheduled. Clinical Course as of May 16 1446  Mon May 15, 2017  1447 Patient was cleared for dialysis but eloped because he stated he did not want to wait any longer  [JW]    Clinical Course User Index [JW] Earleen Newport, MD   Procedures ____________________________________________  DIFFERENTIAL DIAGNOSIS   End-stage renal disease, electrolyte abnormality  FINAL ASSESSMENT AND PLAN  End-stage renal disease on dialysis   Plan:  The patient had presented for dialysis treatment.  He appears medically stable and will be referred to nephrology.   Laurence Aly, MD   Note: This note was generated in part or whole with voice recognition software. Voice recognition is usually quite accurate but there are transcription errors that can and very often do occur. I apologize for any typographical errors that were not detected and corrected.     Earleen Newport, MD 05/15/17 1257    Earleen Newport, MD 05/15/17 [phone]

## 2017-05-17 ENCOUNTER — Emergency Department: Payer: Medicare Other

## 2017-05-17 ENCOUNTER — Ambulatory Visit
Admission: EM | Admit: 2017-05-17 | Discharge: 2017-05-17 | Disposition: A | Discharge status: Home / Self Care | Payer: Medicare Other | Attending: Emergency Medicine | Admitting: Emergency Medicine

## 2017-05-17 ENCOUNTER — Encounter: Payer: Self-pay | Admitting: Emergency Medicine

## 2017-05-17 DIAGNOSIS — I2510 Atherosclerotic heart disease of native coronary artery without angina pectoris: Secondary | ICD-10-CM | POA: Diagnosis not present

## 2017-05-17 DIAGNOSIS — I120 Hypertensive chronic kidney disease with stage 5 chronic kidney disease or end stage renal disease: Secondary | ICD-10-CM | POA: Diagnosis not present

## 2017-05-17 DIAGNOSIS — I509 Heart failure, unspecified: Secondary | ICD-10-CM | POA: Insufficient documentation

## 2017-05-17 DIAGNOSIS — N186 End stage renal disease: Secondary | ICD-10-CM | POA: Diagnosis not present

## 2017-05-17 DIAGNOSIS — E1122 Type 2 diabetes mellitus with diabetic chronic kidney disease: Secondary | ICD-10-CM | POA: Insufficient documentation

## 2017-05-17 DIAGNOSIS — R079 Chest pain, unspecified: Secondary | ICD-10-CM | POA: Diagnosis not present

## 2017-05-17 DIAGNOSIS — J449 Chronic obstructive pulmonary disease, unspecified: Secondary | ICD-10-CM | POA: Insufficient documentation

## 2017-05-17 DIAGNOSIS — Z79899 Other long term (current) drug therapy: Secondary | ICD-10-CM | POA: Diagnosis not present

## 2017-05-17 DIAGNOSIS — I4581 Long QT syndrome: Secondary | ICD-10-CM | POA: Insufficient documentation

## 2017-05-17 DIAGNOSIS — E785 Hyperlipidemia, unspecified: Secondary | ICD-10-CM | POA: Insufficient documentation

## 2017-05-17 DIAGNOSIS — I447 Left bundle-branch block, unspecified: Secondary | ICD-10-CM | POA: Diagnosis not present

## 2017-05-17 DIAGNOSIS — I700 Atherosclerosis of aorta: Secondary | ICD-10-CM | POA: Diagnosis not present

## 2017-05-17 DIAGNOSIS — E1151 Type 2 diabetes mellitus with diabetic peripheral angiopathy without gangrene: Secondary | ICD-10-CM | POA: Diagnosis not present

## 2017-05-17 DIAGNOSIS — I132 Hypertensive heart and chronic kidney disease with heart failure and with stage 5 chronic kidney disease, or end stage renal disease: Secondary | ICD-10-CM | POA: Diagnosis not present

## 2017-05-17 DIAGNOSIS — Z7951 Long term (current) use of inhaled steroids: Secondary | ICD-10-CM | POA: Insufficient documentation

## 2017-05-17 DIAGNOSIS — K7460 Unspecified cirrhosis of liver: Secondary | ICD-10-CM | POA: Insufficient documentation

## 2017-05-17 DIAGNOSIS — K219 Gastro-esophageal reflux disease without esophagitis: Secondary | ICD-10-CM | POA: Diagnosis not present

## 2017-05-17 DIAGNOSIS — D631 Anemia in chronic kidney disease: Secondary | ICD-10-CM | POA: Diagnosis not present

## 2017-05-17 DIAGNOSIS — Z951 Presence of aortocoronary bypass graft: Secondary | ICD-10-CM | POA: Diagnosis not present

## 2017-05-17 DIAGNOSIS — Z992 Dependence on renal dialysis: Secondary | ICD-10-CM | POA: Diagnosis not present

## 2017-05-17 DIAGNOSIS — F17210 Nicotine dependence, cigarettes, uncomplicated: Secondary | ICD-10-CM | POA: Insufficient documentation

## 2017-05-17 DIAGNOSIS — R0602 Shortness of breath: Secondary | ICD-10-CM | POA: Diagnosis not present

## 2017-05-17 LAB — CBC WITH DIFFERENTIAL/PLATELET
Basophils Absolute: 0.1 10*3/uL (ref 0–0.1)
Basophils Relative: 1 %
EOS ABS: 0.1 10*3/uL (ref 0–0.7)
EOS PCT: 1 %
HCT: 33.3 % — ABNORMAL LOW (ref 40.0–52.0)
Hemoglobin: 10.2 g/dL — ABNORMAL LOW (ref 13.0–18.0)
LYMPHS ABS: 0.6 10*3/uL — AB (ref 1.0–3.6)
Lymphocytes Relative: 8 %
MCH: 26.9 pg (ref 26.0–34.0)
MCHC: 30.5 g/dL — AB (ref 32.0–36.0)
MCV: 88 fL (ref 80.0–100.0)
MONOS PCT: 9 %
Monocytes Absolute: 0.7 10*3/uL (ref 0.2–1.0)
Neutro Abs: 6.4 10*3/uL (ref 1.4–6.5)
Neutrophils Relative %: 81 %
PLATELETS: 477 10*3/uL — AB (ref 150–440)
RBC: 3.79 MIL/uL — ABNORMAL LOW (ref 4.40–5.90)
RDW: 21 % — ABNORMAL HIGH (ref 11.5–14.5)
WBC: 7.9 10*3/uL (ref 3.8–10.6)

## 2017-05-17 LAB — BASIC METABOLIC PANEL
ANION GAP: 19 — AB (ref 5–15)
BUN: 63 mg/dL — ABNORMAL HIGH (ref 6–20)
CHLORIDE: 92 mmol/L — AB (ref 101–111)
CO2: 30 mmol/L (ref 22–32)
Calcium: 8.1 mg/dL — ABNORMAL LOW (ref 8.9–10.3)
Creatinine, Ser: 15.44 mg/dL — ABNORMAL HIGH (ref 0.61–1.24)
GFR calc non Af Amer: 3 mL/min — ABNORMAL LOW (ref >60)
GFR, EST AFRICAN AMERICAN: 3 mL/min — AB (ref >60)
Glucose, Bld: 94 mg/dL (ref 65–99)
POTASSIUM: 5.8 mmol/L — AB (ref 3.5–5.1)
Sodium: 141 mmol/L (ref 135–145)

## 2017-05-17 LAB — PHOSPHORUS: PHOSPHORUS: 11.4 mg/dL — AB (ref 2.5–4.6)

## 2017-05-17 LAB — HEPATIC FUNCTION PANEL
ALBUMIN: 2.8 g/dL — AB (ref 3.5–5.0)
ALK PHOS: 122 U/L (ref 38–126)
ALT: 14 U/L — ABNORMAL LOW (ref 17–63)
AST: 20 U/L (ref 15–41)
BILIRUBIN TOTAL: 1.6 mg/dL — AB (ref 0.3–1.2)
Bilirubin, Direct: 0.1 mg/dL (ref 0.1–0.5)
Indirect Bilirubin: 1.5 mg/dL — ABNORMAL HIGH (ref 0.3–0.9)
Total Protein: 6.8 g/dL (ref 6.5–8.1)

## 2017-05-17 MED ORDER — LIDOCAINE-PRILOCAINE 2.5-2.5 % EX CREA: 1.0000 "application " | TOPICAL_CREAM | CUTANEOUS | Status: DC | PRN

## 2017-05-17 MED ORDER — HEPARIN SODIUM (PORCINE) 1000 UNIT/ML DIALYSIS
1000.0000 [IU] | INTRAMUSCULAR | Status: DC | PRN
  Filled 2017-05-17: qty 1

## 2017-05-17 MED ORDER — SODIUM CHLORIDE 0.9 % IV SOLN: 100.0000 mL | INTRAVENOUS | Status: DC | PRN

## 2017-05-17 MED ORDER — PENTAFLUOROPROP-TETRAFLUOROETH EX AERO: 1.0000 "application " | INHALATION_SPRAY | CUTANEOUS | Status: DC | PRN

## 2017-05-17 MED ORDER — LIDOCAINE HCL (PF) 1 % IJ SOLN: 5.0000 mL | INTRAMUSCULAR | Status: DC | PRN

## 2017-05-17 MED ORDER — ALTEPLASE 2 MG IJ SOLR
2.0000 mg | Freq: Once | INTRAMUSCULAR | Status: DC | PRN
  Filled 2017-05-17: qty 2

## 2017-05-17 MED ORDER — GI COCKTAIL ~~LOC~~
30.0000 mL | Freq: Once | ORAL | Status: AC
  Administered 2017-05-17: 30 mL via ORAL
  Filled 2017-05-17: qty 30

## 2017-05-17 NOTE — ED Notes (Signed)
Pt transported to dialysis

## 2017-05-17 NOTE — ED Notes (Signed)
Updated patient on wait for dialysis, pt states "I might as well go on to [facility]" Pt raising voice, informed him that dialysis is seeing other patients and does not stop what they are doing to to see him. I explained to him that there are other patients in the hospital that require dialysis.

## 2017-05-17 NOTE — Progress Notes (Signed)
Pre HD assessment  

## 2017-05-17 NOTE — ED Notes (Signed)
Pt resting quietly on stretcher, pt waiting for dialysis.

## 2017-05-17 NOTE — Discharge Instructions (Signed)
please return as scheduled for next dialysis

## 2017-05-17 NOTE — Progress Notes (Signed)
Post HD assessment. Pt tolerated tx well without c/o or complications. Pt terminated tx earlt r/t needing a ride home. Net UF 3038. Possible stannosis of HD access, MD made aware.

## 2017-05-17 NOTE — ED Provider Notes (Signed)
[facility] Emergency Department Provider Note   ____________________________________________   First MD Initiated Contact with Patient 05/17/17 1150     (approximate)  I have reviewed the triage vital signs and the nursing notes.   HISTORY  Chief Complaint Dialysis Treatment    HPI Stanley [name] is a 57 y.o. male Who gets dialysis.   no dialysis Center in town will see him because he keeps refusing their care. He comes in today asking for dialysis. He has not had dialysis for 6 days. He says he doesn't feel well.  Past Medical History:  Diagnosis Date  . Alcohol abuse   . CHF (congestive heart failure) ([place])   . Cirrhosis ([place])   . Coronary artery disease 2009  . Diabetic peripheral neuropathy ([place])   . Drug abuse ([place])   . End stage renal disease on dialysis [facility]) NEPHROLOGIST-   DR [facility]  IN [place]   HEMODIALYSIS --   TUES/  THURS/  SAT  . GERD (gastroesophageal reflux disease)   . Hyperlipidemia   . Hypertension   . PAD (peripheral artery disease) ([place])   . Renal insufficiency    Per pt, 32 oz fluid restriction per day  . S/P triple vessel bypass 06/09/2016   2009ish  . Suicidal ideation    & HOMICIDAL IDEATION --  06-16-2013   ADMITTED TO BEHAVIOR HEALTH    Patient Active Problem List   Diagnosis Date Noted  . ESRD (end stage renal disease) ([place]) 04/28/2017  . Uremia 03/08/2017  . Hemodialysis patient ([place]) 03/03/2017  . Weakness 02/28/2017  . Hypocalcemia 02/22/2017  . Shortness of breath 11/26/2016  . COPD (chronic obstructive pulmonary disease) ([place]) 10/30/2016  . COPD exacerbation ([place]) 10/29/2016  . Anemia   . Heme positive stool   . Ulceration of intestine   . Benign neoplasm of transverse colon   . Acute gastrointestinal hemorrhage   . Esophageal candidiasis ([place])   . Angiodysplasia of intestinal tract   . Acute respiratory failure with hypoxia ([place]) 07/03/2016  . GI bleeding 06/24/2016  . Rectal bleeding  06/14/2016  . Anemia of chronic disease 06/01/2016  . MRSA carrier 06/01/2016  . Chronic renal failure 05/23/2016  . Ischemic heart disease 05/23/2016  . Angiodysplasia of small intestine   . Melena   . Small bowel bleed not requiring more than 4 units of blood in 24 hours, ICU, or surgery   . Anemia due to chronic blood loss   . Abdominal pain 05/05/2016  . Acute posthemorrhagic anemia 04/17/2016  . Gastrointestinal bleed 04/17/2016  . History of esophagogastroduodenoscopy (EGD) 04/17/2016  . Elevated troponin 04/17/2016  . Alcohol abuse 04/17/2016  . Upper GI bleed 01/19/2016  . Blood in stool   . Angiodysplasia of stomach and duodenum with hemorrhage   . Gastritis   . Esophagitis, unspecified   . GI bleed 05/16/2015  . Acute GI bleeding   . Symptomatic anemia 04/30/2015  . HTN (hypertension) 04/06/2015  . GERD (gastroesophageal reflux disease) 04/06/2015  . HLD (hyperlipidemia) 04/06/2015  . Dyspnea 04/06/2015  . Cirrhosis of liver with ascites ([place]) 04/06/2015  . Ascites 04/06/2015  . GIB (gastrointestinal bleeding) 03/23/2015  . Homicidal ideation 06/19/2013  . Suicidal intent 06/19/2013  . Homicidal ideations 06/19/2013  . Hyperkalemia 06/16/2013  . Mandible fracture ([place]) 06/05/2013  . Fracture, mandible ([place]) 06/02/2013  . Coronary atherosclerosis of native coronary artery 06/02/2013  . ESRD on dialysis ([place]) 06/02/2013  . Mandible open fracture ([place]) 06/02/2013  Past Surgical History:  Procedure Laterality Date  . AGILE CAPSULE N/A 06/19/2016   Procedure: AGILE CAPSULE;  Surgeon: [name], MD;  Location: [facility] ENDOSCOPY;  Service: Endoscopy;  Laterality: N/A;  . COLONOSCOPY WITH PROPOFOL N/A 06/18/2016   Procedure: COLONOSCOPY WITH PROPOFOL;  Surgeon: [name], MD;  Location: [facility] ENDOSCOPY;  Service: Endoscopy;  Laterality: N/A;  . COLONOSCOPY WITH PROPOFOL N/A 08/12/2016   Procedure: COLONOSCOPY WITH PROPOFOL;  Surgeon: [name], MD;  Location: [facility]  ENDOSCOPY;  Service: Endoscopy;  Laterality: N/A;  . COLONOSCOPY WITH PROPOFOL N/A 05/05/2017   Procedure: COLONOSCOPY WITH PROPOFOL;  Surgeon: Manya Silvas, MD;  Location: [facility] ENDOSCOPY;  Service: Endoscopy;  Laterality: N/A;  . CORONARY ANGIOPLASTY  ?   PT UNABLE TO TELL IF  BEFORE OR AFTER  CABG  . CORONARY ARTERY BYPASS GRAFT  2008  ([place] , [place])   3 VESSEL  . DIALYSIS FISTULA CREATION  LAST SURGERY  APPOX  2008  . ENTEROSCOPY N/A 05/10/2016   Procedure: ENTEROSCOPY;  Surgeon: Jerene Bears, MD;  Location: [place];  Service: Gastroenterology;  Laterality: N/A;  . ENTEROSCOPY N/A 08/12/2016   Procedure: ENTEROSCOPY;  Surgeon: [name], MD;  Location: [facility] ENDOSCOPY;  Service: Endoscopy;  Laterality: N/A;  . ESOPHAGOGASTRODUODENOSCOPY N/A 05/07/2015   Procedure: ESOPHAGOGASTRODUODENOSCOPY (EGD);  Surgeon: Hulen Luster, MD;  Location: [facility] ENDOSCOPY;  Service: Endoscopy;  Laterality: N/A;  . ESOPHAGOGASTRODUODENOSCOPY (EGD) WITH PROPOFOL N/A 05/17/2015   Procedure: ESOPHAGOGASTRODUODENOSCOPY (EGD) WITH PROPOFOL;  Surgeon: [name], MD;  Location: [facility] ENDOSCOPY;  Service: Endoscopy;  Laterality: N/A;  . ESOPHAGOGASTRODUODENOSCOPY (EGD) WITH PROPOFOL N/A 01/20/2016   Procedure: ESOPHAGOGASTRODUODENOSCOPY (EGD) WITH PROPOFOL;  Surgeon: [name], MD;  Location: [facility] ENDOSCOPY;  Service: Endoscopy;  Laterality: N/A;  . ESOPHAGOGASTRODUODENOSCOPY (EGD) WITH PROPOFOL N/A 04/17/2016   Procedure: ESOPHAGOGASTRODUODENOSCOPY (EGD) WITH PROPOFOL;  Surgeon: Lin Landsman, MD;  Location: [facility] ENDOSCOPY;  Service: Gastroenterology;  Laterality: N/A;  . ESOPHAGOGASTRODUODENOSCOPY (EGD) WITH PROPOFOL  05/09/2016   Procedure: ESOPHAGOGASTRODUODENOSCOPY (EGD) WITH PROPOFOL;  Surgeon: Jerene Bears, MD;  Location: [place];  Service: Endoscopy;;  . ESOPHAGOGASTRODUODENOSCOPY (EGD) WITH PROPOFOL N/A 06/16/2016   Procedure: ESOPHAGOGASTRODUODENOSCOPY (EGD) WITH PROPOFOL;  Surgeon: [name], MD;   Location: [facility] ENDOSCOPY;  Service: Endoscopy;  Laterality: N/A;  . ESOPHAGOGASTRODUODENOSCOPY (EGD) WITH PROPOFOL N/A 05/05/2017   Procedure: ESOPHAGOGASTRODUODENOSCOPY (EGD) WITH PROPOFOL;  Surgeon: Manya Silvas, MD;  [facility] END[facility]COPY;  Service: Endoscopy;  Laterality: N/A;  . GIVENS CAPSULE STUDY N/A 05/07/2016   Procedure: GIVENS CAPSULE STUDY;  Surgeon: Doran Stabler, MD;  Location: [place];  Service: Endoscopy;  Laterality: N/A;  . MANDIBULAR HARDWARE REMOVAL N/A 07/29/2013   Procedure: REMOVAL OF ARCH BARS;  Surgeon: Theodoro Kos, DO;  Location: [place];  Service: Plastics;  Laterality: N/A;  . ORIF MANDIBULAR FRACTURE N/A 06/05/2013   Procedure: REPAIR OF MANDIBULAR FRACTURE x 2 with maxillo-mandibular fixation ;  Surgeon: Theodoro Kos, DO;  Location: [place];  Service: Plastics;  Laterality: N/A;  . PERIPHERAL ARTERIAL STENT GRAFT Left     Prior to Admission medications   Medication Sig Start Date End Date Taking? Authorizing Provider  albuterol (PROVENTIL HFA;VENTOLIN HFA) 108 (90 Base) MCG/ACT inhaler Inhale 2 puffs into the lungs every 6 (six) hours as needed for wheezing or shortness of breath. 04/21/17   Lisa Roca, MD  alum & mag hydroxide-simeth (MAALOX/MYLANTA) 200-200-20 MG/5ML suspension Take 15 mLs every 4 (four) hours as needed by mouth for indigestion  or heartburn. Patient not taking: Reported on 04/19/2017 01/25/17   Demetrios Loll, MD  atorvastatin (LIPITOR) 40 MG tablet Take 1 tablet (40 mg total) by mouth daily. 11/16/16   Vaughan Basta, MD  budesonide-formoterol (SYMBICORT) 160-4.5 MCG/ACT inhaler Inhale 2 puffs into the lungs daily. 11/16/16   Vaughan Basta, MD  calcium acetate (PHOSLO) 667 MG capsule Take 2,001 mg by mouth 3 (three) times daily.    [provider]  ferrous sulfate 325 (65 FE) MG tablet Take 1 tablet by mouth 3 (three) times daily. 01/21/17   [provider]  furosemide (LASIX) 80 MG  tablet Take 1 tablet (80 mg total) daily by mouth. 01/25/17   Demetrios Loll, MD  gabapentin (NEURONTIN) 300 MG capsule Take 1 capsule by mouth daily as directed 08/16/16   [provider]  ipratropium-albuterol (DUONEB) 0.5-2.5 (3) MG/3ML SOLN Take 3 mLs by nebulization every 6 (six) hours as needed. 11/28/16   Loletha Grayer, MD  labetalol (NORMODYNE) 100 MG tablet Take 100 mg by mouth daily. 03/24/17   [provider]  lactulose (CHRONULAC) 10 GM/15ML solution Take 30 mLs (20 g total) by mouth 3 (three) times daily. Patient not taking: Reported on 04/05/2017 01/11/17   Bettey Costa, MD  Multiple Vitamins-Minerals-FA (DIALYVITE SUPREME D) 3 MG TABS Take 1 tablet (3 mg total) by mouth daily. 11/28/16   Loletha Grayer, MD  nitroGLYCERIN (NITROSTAT) 0.4 MG SL tablet Place 1 tablet (0.4 mg total) under the tongue every 5 (five) minutes as needed. 04/12/16   Wende Bushy, MD  pantoprazole (PROTONIX) 40 MG tablet Take 1 tablet (40 mg total) by mouth daily. 11/28/16   Loletha Grayer, MD  Spacer/Aero Chamber Mouthpiece MISC 1 Units by Does not apply route every 4 (four) hours as needed (wheezing). 02/19/17   Darel Hong, MD  tiotropium (SPIRIVA HANDIHALER) 18 MCG inhalation capsule Place 1 capsule (18 mcg total) into inhaler and inhale daily. 11/16/16 11/16/17  Vaughan Basta, MD    Allergies Patient has no known allergies.  Family History  Problem Relation Age of Onset  . Colon cancer Mother   . Cancer Father   . Cancer Sister   . Kidney disease Brother     Social History Social History   Tobacco Use  . Smoking status: Current Every Day Smoker    Packs/day: 0.15    Years: 40.00    Pack years: 6.00    Types: Cigarettes  . Smokeless tobacco: Never Used  Substance Use Topics  . Alcohol use: No    Comment: pt reports quitting after learning about cirrhosis  . Drug use: No    Review of Systems  Constitutional: No fever/chills Eyes: No visual changes. ENT: No sore  throat. Cardiovascular: Denies chest pain. Respiratory: Denies shortness of breath. Gastrointestinal: No abdominal pain.  No nausea, no vomiting.  No diarrhea.  No constipation. Genitourinary: Negative for dysuria. Musculoskeletal: Negative for back pain. Skin: Negative for rash. Neurological: Negative for headaches, focal weakness   ____________________________________________   PHYSICAL EXAM:  VITAL SIGNS: ED Triage Vitals  Enc Vitals Group     BP 05/17/17 1148 124/75     Pulse Rate 05/17/17 1148 88     Resp 05/17/17 1148 20     Temp 05/17/17 1148 98.8 F (37.1 C)     Temp Source 05/17/17 1148 Oral     SpO2 05/17/17 1148 91 %     Weight --      Height --      Head Circumference --  Peak Flow --      Pain Score 05/17/17 1136 0     Pain Loc --      Pain Edu? --      Excl. in [place]? --     Constitutional: Alert and oriented. Well appearing and in no acute distress. Eyes: Conjunctivae are normal.  Head: Atraumatic. Nose: No congestion/rhinnorhea. Mouth/Throat: Mucous membranes are moist.  Oropharynx non-erythematous. Neck: No stridor. Cardiovascular: Normal rate, regular rhythm. Grossly normal heart sounds.  Good peripheral circulation. Respiratory: Normal respiratory effort.  No retractions. Lungs crackles in bases bilaterally Gastrointestinal: Soft and nontender. distended with apparentascites is. No abdominal bruits. No CVA tenderness. Musculoskeletal: No lower extremity tenderness nor edema.  No joint effusions. Neurologic:  Normal speech and language. No gross focal neurologic deficits are appreciated. No gait instability. Skin:  Skin is warm, dry and intact. No rash noted. Psychiatric: Mood and affect are normal. Speech and behavior are normal.  ____________________________________________   LABS (all labs ordered are listed, but only abnormal results are displayed)  Labs Reviewed  BASIC METABOLIC PANEL - Abnormal; Notable for the following components:       Result Value   Potassium 5.8 (*)    Chloride 92 (*)    BUN 63 (*)    Creatinine, Ser 15.44 (*)    Calcium 8.1 (*)    GFR calc non Af Amer 3 (*)    GFR calc Af Amer 3 (*)    Anion gap 19 (*)    All other components within normal limits  HEPATIC FUNCTION PANEL - Abnormal; Notable for the following components:   Albumin 2.8 (*)    ALT 14 (*)    Total Bilirubin 1.6 (*)    Indirect Bilirubin 1.5 (*)    All other components within normal limits  CBC WITH DIFFERENTIAL/PLATELET - Abnormal; Notable for the following components:   RBC 3.79 (*)    Hemoglobin 10.2 (*)    HCT 33.3 (*)    MCHC 30.5 (*)    RDW 21.0 (*)    Platelets 477 (*)    Lymphs Abs 0.6 (*)    All other components within normal limits   ____________________________________________  EKG  EKG read and interpreted by me shows normal sinus rhythm rate of 87 normal axis computer is reading rightward axis disagree incomplete bundle blanch block T-wave changes in II, III, and F her oldand were present in January of this year ____________________________________________  RADIOLOGY  ED MD interpretation:  chest x-ray looks like some mild CHF I reviewed the film  Official radiology report(s): Dg Chest 2 View  Result Date: 05/17/2017 CLINICAL DATA:  Mid chest pain and shortness of breath for the past 6 days. The patient has missed several dialysis episodes. History of CHF, coronary artery disease, and peripheral vascular disease. Previous CABG. EXAM: CHEST - 2 VIEW COMPARISON:  Chest x-ray of May 03, 2017 FINDINGS: The lungs are well-expanded. The interstitial markings are increased. The pulmonary vascularity is engorged. The cardiac silhouette is mildly enlarged. There are post CABG changes. There is calcification in the wall of the thoracic aorta. A trace of pleural fluid blunts the posterior costophrenic angles. IMPRESSION: Findings compatible with low-grade CHF.  No alveolar pneumonia. Thoracic aortic atherosclerosis.  Electronically Signed   By: David  Martinique M.D.   On: 05/17/2017 12:33    ____________________________________________   PROCEDURES  Procedure(s) performed:   Procedures  Critical Care performed:   ____________________________________________   INITIAL IMPRESSION / ASSESSMENT AND PLAN / ED  COURSE patient said he was missing dialysis for 6 days. Review the records show he had dialysis 2 days ago on the fourth. Lab work shows a potassium of 5.4 and creatinine of 15 we'll get him dialyzed today.          ____________________________________________   FINAL CLINICAL IMPRESSION(S) / ED DIAGNOSES  Final diagnoses:  End stage renal disease on dialysis [facility])     ED Discharge Orders    None       Note:  This document was prepared using Dragon voice recognition software and may include unintentional dictation errors.    Nena Polio, MD 05/17/17 [id number]

## 2017-05-17 NOTE — ED Provider Notes (Signed)
[facility] EMERGENCY DEPARTMENT Provider Note   CSN: [id number] Arrival date & time: 05/17/17  1134     History   Chief Complaint Chief Complaint  Patient presents with  . Dialysis Treatment    HPI Stanley [name] is a 57 y.o. male.  HPI  Past Medical History:  Diagnosis Date  . Alcohol abuse   . CHF (congestive heart failure) ([place])   . Cirrhosis ([place])   . Coronary artery disease 2009  . Diabetic peripheral neuropathy ([place])   . Drug abuse ([place])   . End stage renal disease on dialysis [facility]) NEPHROLOGIST-   DR [facility]  IN [place]   HEMODIALYSIS --   TUES/  THURS/  SAT  . GERD (gastroesophageal reflux disease)   . Hyperlipidemia   . Hypertension   . PAD (peripheral artery disease) ([place])   . Renal insufficiency    Per pt, 32 oz fluid restriction per day  . S/P triple vessel bypass 06/09/2016   2009ish  . Suicidal ideation    & HOMICIDAL IDEATION --  06-16-2013   ADMITTED TO BEHAVIOR HEALTH    Patient Active Problem List   Diagnosis Date Noted  . ESRD (end stage renal disease) ([place]) 04/28/2017  . Uremia 03/08/2017  . Hemodialysis patient ([place]) 03/03/2017  . Weakness 02/28/2017  . Hypocalcemia 02/22/2017  . Shortness of breath 11/26/2016  . COPD (chronic obstructive pulmonary disease) ([place]) 10/30/2016  . COPD exacerbation ([place]) 10/29/2016  . Anemia   . Heme positive stool   . Ulceration of intestine   . Benign neoplasm of transverse colon   . Acute gastrointestinal hemorrhage   . Esophageal candidiasis ([place])   . Angiodysplasia of intestinal tract   . Acute respiratory failure with hypoxia ([place]) 07/03/2016  . GI bleeding 06/24/2016  . Rectal bleeding 06/14/2016  . Anemia of chronic disease 06/01/2016  . MRSA carrier 06/01/2016  . Chronic renal failure 05/23/2016  . Ischemic heart disease 05/23/2016  . Angiodysplasia of small intestine   . Melena   . Small bowel bleed not requiring more than 4 units of blood in 24 hours, ICU, or  surgery   . Anemia due to chronic blood loss   . Abdominal pain 05/05/2016  . Acute posthemorrhagic anemia 04/17/2016  . Gastrointestinal bleed 04/17/2016  . History of esophagogastroduodenoscopy (EGD) 04/17/2016  . Elevated troponin 04/17/2016  . Alcohol abuse 04/17/2016  . Upper GI bleed 01/19/2016  . Blood in stool   . Angiodysplasia of stomach and duodenum with hemorrhage   . Gastritis   . Esophagitis, unspecified   . GI bleed 05/16/2015  . Acute GI bleeding   . Symptomatic anemia 04/30/2015  . HTN (hypertension) 04/06/2015  . GERD (gastroesophageal reflux disease) 04/06/2015  . HLD (hyperlipidemia) 04/06/2015  . Dyspnea 04/06/2015  . Cirrhosis of liver with ascites ([place]) 04/06/2015  . Ascites 04/06/2015  . GIB (gastrointestinal bleeding) 03/23/2015  . Homicidal ideation 06/19/2013  . Suicidal intent 06/19/2013  . Homicidal ideations 06/19/2013  . Hyperkalemia 06/16/2013  . Mandible fracture ([place]) 06/05/2013  . Fracture, mandible ([place]) 06/02/2013  . Coronary atherosclerosis of native coronary artery 06/02/2013  . ESRD on dialysis ([place]) 06/02/2013  . Mandible open fracture ([place]) 06/02/2013    Past Surgical History:  Procedure Laterality Date  . AGILE CAPSULE N/A 06/19/2016   Procedure: AGILE CAPSULE;  Surgeon: [name], MD;  Location: [facility] ENDOSCOPY;  Service: Endoscopy;  Laterality: N/A;  . COLONOSCOPY WITH PROPOFOL N/A 06/18/2016   Procedure: COLONOSCOPY WITH  PROPOFOL;  Surgeon: [name], MD;  Location: [facility] ENDOSCOPY;  Service: Endoscopy;  Laterality: N/A;  . COLONOSCOPY WITH PROPOFOL N/A 08/12/2016   Procedure: COLONOSCOPY WITH PROPOFOL;  Surgeon: [name], MD;  Location: [facility] ENDOSCOPY;  Service: Endoscopy;  Laterality: N/A;  . COLONOSCOPY WITH PROPOFOL N/A 05/05/2017   Procedure: COLONOSCOPY WITH PROPOFOL;  Surgeon: Manya Silvas, MD;  Location: [facility] ENDOSCOPY;  Service: Endoscopy;  Laterality: N/A;  . CORONARY ANGIOPLASTY  ?   PT UNABLE TO TELL IF  BEFORE  OR AFTER  CABG  . CORONARY ARTERY BYPASS GRAFT  2008  ([place] , [place])   3 VESSEL  . DIALYSIS FISTULA CREATION  LAST SURGERY  APPOX  2008  . ENTEROSCOPY N/A 05/10/2016   Procedure: ENTEROSCOPY;  Surgeon: Jerene Bears, MD;  Location: [place];  Service: Gastroenterology;  Laterality: N/A;  . ENTEROSCOPY N/A 08/12/2016   Procedure: ENTEROSCOPY;  Surgeon: [name], MD;  Location: [facility] ENDOSCOPY;  Service: Endoscopy;  Laterality: N/A;  . ESOPHAGOGASTRODUODENOSCOPY N/A 05/07/2015   Procedure: ESOPHAGOGASTRODUODENOSCOPY (EGD);  Surgeon: Hulen Luster, MD;  Location: [facility] ENDOSCOPY;  Service: Endoscopy;  Laterality: N/A;  . ESOPHAGOGASTRODUODENOSCOPY (EGD) WITH PROPOFOL N/A 05/17/2015   Procedure: ESOPHAGOGASTRODUODENOSCOPY (EGD) WITH PROPOFOL;  Surgeon: [name], MD;  Location: [facility] ENDOSCOPY;  Service: Endoscopy;  Laterality: N/A;  . ESOPHAGOGASTRODUODENOSCOPY (EGD) WITH PROPOFOL N/A 01/20/2016   Procedure: ESOPHAGOGASTRODUODENOSCOPY (EGD) WITH PROPOFOL;  Surgeon: [name], MD;  Location: [facility] ENDOSCOPY;  Service: Endoscopy;  Laterality: N/A;  . ESOPHAGOGASTRODUODENOSCOPY (EGD) WITH PROPOFOL N/A 04/17/2016   Procedure: ESOPHAGOGASTRODUODENOSCOPY (EGD) WITH PROPOFOL;  Surgeon: Lin Landsman, MD;  Location: [facility] ENDOSCOPY;  Service: Gastroenterology;  Laterality: N/A;  . ESOPHAGOGASTRODUODENOSCOPY (EGD) WITH PROPOFOL  05/09/2016   Procedure: ESOPHAGOGASTRODUODENOSCOPY (EGD) WITH PROPOFOL;  Surgeon: Jerene Bears, MD;  Location: [place];  Service: Endoscopy;;  . ESOPHAGOGASTRODUODENOSCOPY (EGD) WITH PROPOFOL N/A 06/16/2016   Procedure: ESOPHAGOGASTRODUODENOSCOPY (EGD) WITH PROPOFOL;  Surgeon: [name], MD;  Location: [facility] ENDOSCOPY;  Service: Endoscopy;  Laterality: N/A;  . ESOPHAGOGASTRODUODENOSCOPY (EGD) WITH PROPOFOL N/A 05/05/2017   Procedure: ESOPHAGOGASTRODUODENOSCOPY (EGD) WITH PROPOFOL;  Surgeon: Manya Silvas, MD;  Location: [facility] ENDOSCOPY;  Service: Endoscopy;  Laterality: N/A;  .  GIVENS CAPSULE STUDY N/A 05/07/2016   Procedure: GIVENS CAPSULE STUDY;  Surgeon: Doran Stabler, MD;  Location: [place];  Service: Endoscopy;  Laterality: N/A;  . MANDIBULAR HARDWARE REMOVAL N/A 07/29/2013   Procedure: REMOVAL OF ARCH BARS;  Surgeon: Theodoro Kos, DO;  Location: [place];  Service: Plastics;  Laterality: N/A;  . ORIF MANDIBULAR FRACTURE N/A 06/05/2013   Procedure: REPAIR OF MANDIBULAR FRACTURE x 2 with maxillo-mandibular fixation ;  Surgeon: Theodoro Kos, DO;  Location: [place];  Service: Plastics;  Laterality: N/A;  . PERIPHERAL ARTERIAL STENT GRAFT Left        Home Medications    Prior to Admission medications   Medication Sig Start Date End Date Taking? Authorizing Provider  albuterol (PROVENTIL HFA;VENTOLIN HFA) 108 (90 Base) MCG/ACT inhaler Inhale 2 puffs into the lungs every 6 (six) hours as needed for wheezing or shortness of breath. 04/21/17  Yes Lisa Roca, MD  atorvastatin (LIPITOR) 40 MG tablet Take 1 tablet (40 mg total) by mouth daily. 11/16/16  Yes Vaughan Basta, MD  budesonide-formoterol [facility]) 160-4.5 MCG/ACT inhaler Inhale 2 puffs into the lungs daily. 11/16/16  Yes Vaughan Basta, MD  calcium acetate (PHOSLO) 667 MG capsule Take 2,001 mg by mouth 3 (three) times daily.   Yes  [provider]  ferrous sulfate 325 (65 FE) MG tablet Take 1 tablet by mouth 3 (three) times daily. 01/21/17  Yes [provider]  furosemide (LASIX) 80 MG tablet Take 1 tablet (80 mg total) daily by mouth. 01/25/17  Yes Demetrios Loll, MD  gabapentin (NEURONTIN) 300 MG capsule Take 1 capsule by mouth daily as directed 08/16/16  Yes [provider]  ipratropium-albuterol (DUONEB) 0.5-2.5 (3) MG/3ML SOLN Take 3 mLs by nebulization every 6 (six) hours as needed. 11/28/16  Yes Wieting, Richard, MD  labetalol (NORMODYNE) 100 MG tablet Take 100 mg by mouth daily. 03/24/17  Yes [provider]  Multiple  Vitamins-Minerals-FA (DIALYVITE SUPREME D) 3 MG TABS Take 1 tablet (3 mg total) by mouth daily. 11/28/16  Yes Wieting, Richard, MD  nitroGLYCERIN (NITROSTAT) 0.4 MG SL tablet Place 1 tablet (0.4 mg total) under the tongue every 5 (five) minutes as needed. 04/12/16  Yes Wende Bushy, MD  pantoprazole (PROTONIX) 40 MG tablet Take 1 tablet (40 mg total) by mouth daily. 11/28/16  Yes Loletha Grayer, MD  Spacer/Aero Chamber Mouthpiece MISC 1 Units by Does not apply route every 4 (four) hours as needed (wheezing). 02/19/17  Yes Darel Hong, MD  tiotropium (SPIRIVA HANDIHALER) 18 MCG inhalation capsule Place 1 capsule (18 mcg total) into inhaler and inhale daily. 11/16/16 11/16/17 Yes Vaughan Basta, MD  alum & mag hydroxide-simeth (MAALOX/MYLANTA) 200-200-20 MG/5ML suspension Take 15 mLs every 4 (four) hours as needed by mouth for indigestion or heartburn. Patient not taking: Reported on 04/19/2017 01/25/17   Demetrios Loll, MD  lactulose [facility]) 10 GM/15ML solution Take 30 mLs (20 g total) by mouth 3 (three) times daily. Patient not taking: Reported on 04/05/2017 01/11/17   Bettey Costa, MD    Family History Family History  Problem Relation Age of Onset  . Colon cancer Mother   . Cancer Father   . Cancer Sister   . Kidney disease Brother     Social History Social History   Tobacco Use  . Smoking status: Current Every Day Smoker    Packs/day: 0.15    Years: 40.00    Pack years: 6.00    Types: Cigarettes  . Smokeless tobacco: Never Used  Substance Use Topics  . Alcohol use: No    Comment: pt reports quitting after learning about cirrhosis  . Drug use: No     Allergies   Patient has no known allergies.   Review of Systems Review of Systems   Physical Exam Updated Vital Signs BP (!) 151/89 (BP Location: Left Arm)   Pulse 87   Temp 97.8 F (36.6 C) (Oral)   Resp 18   SpO2 98%   Physical Exam   ED Treatments / Results  Labs (all labs ordered are listed, but only  abnormal results are displayed) Labs Reviewed  BASIC METABOLIC PANEL - Abnormal; Notable for the following components:      Result Value   Potassium 5.8 (*)    Chloride 92 (*)    BUN 63 (*)    Creatinine, Ser 15.44 (*)    Calcium 8.1 (*)    GFR calc non Af Amer 3 (*)    GFR calc Af Amer 3 (*)    Anion gap 19 (*)    All other components within normal limits  HEPATIC FUNCTION PANEL - Abnormal; Notable for the following components:   Albumin 2.8 (*)    ALT 14 (*)    Total Bilirubin 1.6 (*)  Indirect Bilirubin 1.5 (*)    All other components within normal limits  CBC WITH DIFFERENTIAL/PLATELET - Abnormal; Notable for the following components:   RBC 3.79 (*)    Hemoglobin 10.2 (*)    HCT 33.3 (*)    MCHC 30.5 (*)    RDW 21.0 (*)    Platelets 477 (*)    Lymphs Abs 0.6 (*)    All other components within normal limits  PHOSPHORUS - Abnormal; Notable for the following components:   Phosphorus 11.4 (*)    All other components within normal limits    EKG  EKG Interpretation None       Radiology Dg Chest 2 View  Result Date: 05/17/2017 CLINICAL DATA:  Mid chest pain and shortness of breath for the past 6 days. The patient has missed several dialysis episodes. History of CHF, coronary artery disease, and peripheral vascular disease. Previous CABG. EXAM: CHEST - 2 VIEW COMPARISON:  Chest x-ray of May 03, 2017 FINDINGS: The lungs are well-expanded. The interstitial markings are increased. The pulmonary vascularity is engorged. The cardiac silhouette is mildly enlarged. There are post CABG changes. There is calcification in the wall of the thoracic aorta. A trace of pleural fluid blunts the posterior costophrenic angles. IMPRESSION: Findings compatible with low-grade CHF.  No alveolar pneumonia. Thoracic aortic atherosclerosis. Electronically Signed   By: David  Martinique M.D.   On: 05/17/2017 12:33    Procedures Procedures (including critical care time)  Medications Ordered in  ED Medications  gi cocktail (Maalox,Lidocaine,Donnatal) (30 mLs Oral Given 05/17/17 1317)     Initial Impression / Assessment and Plan / ED Course  I have reviewed the triage vital signs and the nursing notes.        Final Clinical Impressions(s) / ED Diagnoses   Final diagnoses:  End stage renal disease on dialysis [facility])    ED Discharge Orders    None       Nena Polio, MD 05/17/17 [phone]

## 2017-05-17 NOTE — Progress Notes (Signed)
HD tx start 

## 2017-05-17 NOTE — Progress Notes (Signed)
Post HD assessment  

## 2017-05-17 NOTE — ED Triage Notes (Signed)
Pt here for his routine dialysis treatment.

## 2017-05-17 NOTE — ED Notes (Signed)
Notified Dialysis nurse Carolee Rota that the patient is in the ED, Carolee Rota states the dialysis unit is full at this time.

## 2017-05-17 NOTE — Progress Notes (Signed)
HD tx end  

## 2017-05-17 NOTE — ED Notes (Signed)
Pt refused to wait for D/C instruction, pt refused to sign for D/C. Pt ambulatory to the lobby without difficulty at this time.

## 2017-05-19 ENCOUNTER — Emergency Department
Admission: EM | Admit: 2017-05-19 | Discharge: 2017-05-19 | Disposition: A | Discharge status: Home / Self Care | Payer: Medicare Other | Attending: Emergency Medicine | Admitting: Emergency Medicine

## 2017-05-19 ENCOUNTER — Encounter: Payer: Self-pay | Admitting: Emergency Medicine

## 2017-05-19 DIAGNOSIS — E1151 Type 2 diabetes mellitus with diabetic peripheral angiopathy without gangrene: Secondary | ICD-10-CM | POA: Diagnosis not present

## 2017-05-19 DIAGNOSIS — I2510 Atherosclerotic heart disease of native coronary artery without angina pectoris: Secondary | ICD-10-CM | POA: Insufficient documentation

## 2017-05-19 DIAGNOSIS — F17210 Nicotine dependence, cigarettes, uncomplicated: Secondary | ICD-10-CM | POA: Diagnosis not present

## 2017-05-19 DIAGNOSIS — R188 Other ascites: Secondary | ICD-10-CM | POA: Insufficient documentation

## 2017-05-19 DIAGNOSIS — Z7951 Long term (current) use of inhaled steroids: Secondary | ICD-10-CM | POA: Diagnosis not present

## 2017-05-19 DIAGNOSIS — J449 Chronic obstructive pulmonary disease, unspecified: Secondary | ICD-10-CM | POA: Diagnosis not present

## 2017-05-19 DIAGNOSIS — E1122 Type 2 diabetes mellitus with diabetic chronic kidney disease: Secondary | ICD-10-CM | POA: Diagnosis not present

## 2017-05-19 DIAGNOSIS — Z79899 Other long term (current) drug therapy: Secondary | ICD-10-CM | POA: Insufficient documentation

## 2017-05-19 DIAGNOSIS — Z951 Presence of aortocoronary bypass graft: Secondary | ICD-10-CM | POA: Insufficient documentation

## 2017-05-19 DIAGNOSIS — Z22322 Carrier or suspected carrier of Methicillin resistant Staphylococcus aureus: Secondary | ICD-10-CM | POA: Diagnosis not present

## 2017-05-19 DIAGNOSIS — K219 Gastro-esophageal reflux disease without esophagitis: Secondary | ICD-10-CM | POA: Diagnosis not present

## 2017-05-19 DIAGNOSIS — Z992 Dependence on renal dialysis: Secondary | ICD-10-CM | POA: Insufficient documentation

## 2017-05-19 DIAGNOSIS — E1142 Type 2 diabetes mellitus with diabetic polyneuropathy: Secondary | ICD-10-CM | POA: Insufficient documentation

## 2017-05-19 DIAGNOSIS — E875 Hyperkalemia: Secondary | ICD-10-CM | POA: Diagnosis not present

## 2017-05-19 DIAGNOSIS — N186 End stage renal disease: Secondary | ICD-10-CM | POA: Diagnosis not present

## 2017-05-19 DIAGNOSIS — I132 Hypertensive heart and chronic kidney disease with heart failure and with stage 5 chronic kidney disease, or end stage renal disease: Secondary | ICD-10-CM | POA: Diagnosis not present

## 2017-05-19 DIAGNOSIS — N2581 Secondary hyperparathyroidism of renal origin: Secondary | ICD-10-CM | POA: Insufficient documentation

## 2017-05-19 DIAGNOSIS — D631 Anemia in chronic kidney disease: Secondary | ICD-10-CM | POA: Diagnosis not present

## 2017-05-19 DIAGNOSIS — E785 Hyperlipidemia, unspecified: Secondary | ICD-10-CM | POA: Diagnosis not present

## 2017-05-19 DIAGNOSIS — Z95820 Peripheral vascular angioplasty status with implants and grafts: Secondary | ICD-10-CM | POA: Insufficient documentation

## 2017-05-19 DIAGNOSIS — I509 Heart failure, unspecified: Secondary | ICD-10-CM | POA: Diagnosis not present

## 2017-05-19 LAB — PHOSPHORUS: PHOSPHORUS: 10.7 mg/dL — AB (ref 2.5–4.6)

## 2017-05-19 MED ORDER — SODIUM CHLORIDE 0.9 % IV SOLN: 100.0000 mL | INTRAVENOUS | Status: DC | PRN

## 2017-05-19 MED ORDER — DIPHENHYDRAMINE HCL 50 MG/ML IJ SOLN
25.0000 mg | Freq: Once | INTRAMUSCULAR | Status: AC
  Administered 2017-05-19: 25 mg via INTRAVENOUS

## 2017-05-19 MED ORDER — LIDOCAINE-PRILOCAINE 2.5-2.5 % EX CREA: 1.0000 "application " | TOPICAL_CREAM | CUTANEOUS | Status: DC | PRN

## 2017-05-19 MED ORDER — HEPARIN SODIUM (PORCINE) 1000 UNIT/ML DIALYSIS
20.0000 [IU]/kg | INTRAMUSCULAR | Status: DC | PRN
  Filled 2017-05-19: qty 2

## 2017-05-19 MED ORDER — PENTAFLUOROPROP-TETRAFLUOROETH EX AERO: 1.0000 "application " | INHALATION_SPRAY | CUTANEOUS | Status: DC | PRN

## 2017-05-19 MED ORDER — HEPARIN SODIUM (PORCINE) 1000 UNIT/ML DIALYSIS
1000.0000 [IU] | INTRAMUSCULAR | Status: DC | PRN
  Filled 2017-05-19: qty 1

## 2017-05-19 MED ORDER — LIDOCAINE HCL (PF) 1 % IJ SOLN: 5.0000 mL | INTRAMUSCULAR | Status: DC | PRN

## 2017-05-19 NOTE — Progress Notes (Signed)
HD tx end, pt requested to be taken off early.

## 2017-05-19 NOTE — ED Provider Notes (Addendum)
[facility] Emergency Department Provider Note  ____________________________________________  Time seen: Approximately 1:08 PM  I have reviewed the triage vital signs and the nursing notes.   HISTORY  Chief Complaint Dialysis Treatment    HPI Stanley [name] is a 57 y.o. male who presents to the ED for routine dialysis. He denies any acute complaints. He comes to the ED for routine dialysis because he is not currently allowed to return to any of the dialysis treatment centers in the area. Last dialysis was 2 days ago. He has no concerns at this time just wants dialysis.     Past Medical History:  Diagnosis Date  . Alcohol abuse   . CHF (congestive heart failure) ([place])   . Cirrhosis ([place])   . Coronary artery disease 2009  . Diabetic peripheral neuropathy ([place])   . Drug abuse ([place])   . End stage renal disease on dialysis [facility]) NEPHROLOGIST-   DR [facility]  IN    HEMODIALYSIS --   TUES/  THURS/  SAT  . GERD (gastroesophageal reflux disease)   . Hyperlipidemia   . Hypertension   . PAD (peripheral artery disease) ([place])   . Renal insufficiency    Per pt, 32 oz fluid restriction per day  . S/P triple vessel bypass 06/09/2016   2009ish  . Suicidal ideation    & HOMICIDAL IDEATION --  06-16-2013   ADMITTED TO BEHAVIOR HEALTH     Patient Active Problem List   Diagnosis Date Noted  . ESRD (end stage renal disease) ([place]) 04/28/2017  . Uremia 03/08/2017  . Hemodialysis patient ([place]) 03/03/2017  . Weakness 02/28/2017  . Hypocalcemia 02/22/2017  . Shortness of breath 11/26/2016  . COPD (chronic obstructive pulmonary disease) ([place]) 10/30/2016  . COPD exacerbation ([place]) 10/29/2016  . Anemia   . Heme positive stool   . Ulceration of intestine   . Benign neoplasm of transverse colon   . Acute gastrointestinal hemorrhage   . Esophageal candidiasis ([place])   . Angiodysplasia of intestinal tract   . Acute respiratory failure with hypoxia ([place])  07/03/2016  . GI bleeding 06/24/2016  . Rectal bleeding 06/14/2016  . Anemia of chronic disease 06/01/2016  . MRSA carrier 06/01/2016  . Chronic renal failure 05/23/2016  . Ischemic heart disease 05/23/2016  . Angiodysplasia of small intestine   . Melena   . Small bowel bleed not requiring more than 4 units of blood in 24 hours, ICU, or surgery   . Anemia due to chronic blood loss   . Abdominal pain 05/05/2016  . Acute posthemorrhagic anemia 04/17/2016  . Gastrointestinal bleed 04/17/2016  . History of esophagogastroduodenoscopy (EGD) 04/17/2016  . Elevated troponin 04/17/2016  . Alcohol abuse 04/17/2016  . Upper GI bleed 01/19/2016  . Blood in stool   . Angiodysplasia of stomach and duodenum with hemorrhage   . Gastritis   . Esophagitis, unspecified   . GI bleed 05/16/2015  . Acute GI bleeding   . Symptomatic anemia 04/30/2015  . HTN (hypertension) 04/06/2015  . GERD (gastroesophageal reflux disease) 04/06/2015  . HLD (hyperlipidemia) 04/06/2015  . Dyspnea 04/06/2015  . Cirrhosis of liver with ascites ([employer]) 04/06/2015  . Ascites 04/06/2015  . GIB (gastrointestinal bleeding) 03/23/2015  . Homicidal ideation 06/19/2013  . Suicidal intent 06/19/2013  . Homicidal ideations 06/19/2013  . Hyperkalemia 06/16/2013  . Mandible fracture ([place]) 06/05/2013  . Fracture, mandible ([place]) 06/02/2013  . Coronary atherosclerosis of native coronary artery 06/02/2013  . ESRD on dialysis ([place]) 06/02/2013  .  Mandible open fracture ([place]) 06/02/2013     Past Surgical History:  Procedure Laterality Date  . AGILE CAPSULE N/A 06/19/2016   Procedure: AGILE CAPSULE;  Surgeon: [name], MD;  Location: [facility] ENDOSCOPY;  Service: Endoscopy;  Laterality: N/A;  . COLONOSCOPY WITH PROPOFOL N/A 06/18/2016   Procedure: COLONOSCOPY WITH PROPOFOL;  Surgeon: [name], MD;  Location: [facility] ENDOSCOPY;  Service: Endoscopy;  Laterality: N/A;  . COLONOSCOPY WITH PROPOFOL N/A 08/12/2016   Procedure: COLONOSCOPY WITH  PROPOFOL;  Surgeon: [name], MD;  Location: [facility] ENDOSCOPY;  Service: Endoscopy;  Laterality: N/A;  . COLONOSCOPY WITH PROPOFOL N/A 05/05/2017   Procedure: COLONOSCOPY WITH PROPOFOL;  Surgeon: Manya Silvas, MD;  Location: [facility] ENDOSCOPY;  Service: Endoscopy;  Laterality: N/A;  . CORONARY ANGIOPLASTY  ?   PT UNABLE TO TELL IF  BEFORE OR AFTER  CABG  . CORONARY ARTERY BYPASS GRAFT  2008  ([place] , )   3 VESSEL  . DIALYSIS FISTULA CREATION  LAST SURGERY  APPOX  2008  . ENTEROSCOPY N/A 05/10/2016   Procedure: ENTEROSCOPY;  Surgeon: Jerene Bears, MD;  Location: [place];  Service: Gastroenterology;  Laterality: N/A;  . ENTEROSCOPY N/A 08/12/2016   Procedure: ENTEROSCOPY;  Surgeon: [name], MD;  Location: [facility] ENDOSCOPY;  Service: Endoscopy;  Laterality: N/A;  . ESOPHAGOGASTRODUODENOSCOPY N/A 05/07/2015   Procedure: ESOPHAGOGASTRODUODENOSCOPY (EGD);  Surgeon: Hulen Luster, MD;  Location: [facility] ENDOSCOPY;  Service: Endoscopy;  Laterality: N/A;  . ESOPHAGOGASTRODUODENOSCOPY (EGD) WITH PROPOFOL N/A 05/17/2015   Procedure: ESOPHAGOGASTRODUODENOSCOPY (EGD) WITH PROPOFOL;  Surgeon: [name], MD;  Location: [facility] ENDOSCOPY;  Service: Endoscopy;  Laterality: N/A;  . ESOPHAGOGASTRODUODENOSCOPY (EGD) WITH PROPOFOL N/A 01/20/2016   Procedure: ESOPHAGOGASTRODUODENOSCOPY (EGD) WITH PROPOFOL;  Surgeon: [name], MD;  Location: [facility] ENDOSCOPY;  Service: Endoscopy;  Laterality: N/A;  . ESOPHAGOGASTRODUODENOSCOPY (EGD) WITH PROPOFOL N/A 04/17/2016   Procedure: ESOPHAGOGASTRODUODENOSCOPY (EGD) WITH PROPOFOL;  Surgeon: Lin Landsman, MD;  Location: [facility] ENDOSCOPY;  Service: Gastroenterology;  Laterality: N/A;  . ESOPHAGOGASTRODUODENOSCOPY (EGD) WITH PROPOFOL  05/09/2016   Procedure: ESOPHAGOGASTRODUODENOSCOPY (EGD) WITH PROPOFOL;  Surgeon: Jerene Bears, MD;  Location: [place];  Service: Endoscopy;;  . ESOPHAGOGASTRODUODENOSCOPY (EGD) WITH PROPOFOL N/A 06/16/2016   Procedure:  ESOPHAGOGASTRODUODENOSCOPY (EGD) WITH PROPOFOL;  Surgeon: [name], MD;  Location: [facility] ENDOSCOPY;  Service: Endoscopy;  Laterality: N/A;  . ESOPHAGOGASTRODUODENOSCOPY (EGD) WITH PROPOFOL N/A 05/05/2017   Procedure: ESOPHAGOGASTRODUODENOSCOPY (EGD) WITH PROPOFOL;  Surgeon: Manya Silvas, MD;  Location: [facility] ENDOSCOPY;  Service: Endoscopy;  Laterality: N/A;  . GIVENS CAPSULE STUDY N/A 05/07/2016   Procedure: GIVENS CAPSULE STUDY;  Surgeon: Doran Stabler, MD;  Location: [place];  Service: Endoscopy;  Laterality: N/A;  . MANDIBULAR HARDWARE REMOVAL N/A 07/29/2013   Procedure: REMOVAL OF ARCH BARS;  Surgeon: Theodoro Kos, DO;  Location: [place];  Service: Plastics;  Laterality: N/A;  . ORIF MANDIBULAR FRACTURE N/A 06/05/2013   Procedure: REPAIR OF MANDIBULAR FRACTURE x 2 with maxillo-mandibular fixation ;  Surgeon: Theodoro Kos, DO;  Location: [place];  Service: Plastics;  Laterality: N/A;  . PERIPHERAL ARTERIAL STENT GRAFT Left      Prior to Admission medications   Medication Sig Start Date End Date Taking? Authorizing Provider  albuterol (PROVENTIL HFA;VENTOLIN HFA) 108 (90 Base) MCG/ACT inhaler Inhale 2 puffs into the lungs every 6 (six) hours as needed for wheezing or shortness of breath. 04/21/17   Lisa Roca, MD  alum & mag hydroxide-simeth (MAALOX/MYLANTA) 200-200-20 MG/5ML suspension Take 15 mLs  every 4 (four) hours as needed by mouth for indigestion or heartburn. Patient not taking: Reported on 04/19/2017 01/25/17   Demetrios Loll, MD  atorvastatin (LIPITOR) 40 MG tablet Take 1 tablet (40 mg total) by mouth daily. 11/16/16   Vaughan Basta, MD  budesonide-formoterol (SYMBICORT) 160-4.5 MCG/ACT inhaler Inhale 2 puffs into the lungs daily. 11/16/16   Vaughan Basta, MD  calcium acetate (PHOSLO) 667 MG capsule Take 2,001 mg by mouth 3 (three) times daily.    [provider]  ferrous sulfate 325 (65 FE) MG tablet Take 1 tablet by mouth 3 (three)  times daily. 01/21/17   [provider]  furosemide (LASIX) 80 MG tablet Take 1 tablet (80 mg total) daily by mouth. 01/25/17   Demetrios Loll, MD  gabapentin (NEURONTIN) 300 MG capsule Take 1 capsule by mouth daily as directed 08/16/16   [provider]  ipratropium-albuterol (DUONEB) 0.5-2.5 (3) MG/3ML SOLN Take 3 mLs by nebulization every 6 (six) hours as needed. 11/28/16   Loletha Grayer, MD  labetalol (NORMODYNE) 100 MG tablet Take 100 mg by mouth daily. 03/24/17   [provider]  lactulose (CHRONULAC) 10 GM/15ML solution Take 30 mLs (20 g total) by mouth 3 (three) times daily. Patient not taking: Reported on 04/05/2017 01/11/17   Bettey Costa, MD  Multiple Vitamins-Minerals-FA (DIALYVITE SUPREME D) 3 MG TABS Take 1 tablet (3 mg total) by mouth daily. 11/28/16   Loletha Grayer, MD  nitroGLYCERIN (NITROSTAT) 0.4 MG SL tablet Place 1 tablet (0.4 mg total) under the tongue every 5 (five) minutes as needed. 04/12/16   Wende Bushy, MD  pantoprazole (PROTONIX) 40 MG tablet Take 1 tablet (40 mg total) by mouth daily. 11/28/16   Loletha Grayer, MD  Spacer/Aero Chamber Mouthpiece MISC 1 Units by Does not apply route every 4 (four) hours as needed (wheezing). 02/19/17   Darel Hong, MD  tiotropium (SPIRIVA HANDIHALER) 18 MCG inhalation capsule Place 1 capsule (18 mcg total) into inhaler and inhale daily. 11/16/16 11/16/17  Vaughan Basta, MD     Allergies Patient has no known allergies.   Family History  Problem Relation Age of Onset  . Colon cancer Mother   . Cancer Father   . Cancer Sister   . Kidney disease Brother     Social History Social History   Tobacco Use  . Smoking status: Current Every Day Smoker    Packs/day: 0.15    Years: 40.00    Pack years: 6.00    Types: Cigarettes  . Smokeless tobacco: Never Used  Substance Use Topics  . Alcohol use: No    Comment: pt reports quitting after learning about cirrhosis  . Drug use: No    Review of  Systems  Constitutional:   No fever or chills.  ENT:   No sore throat. No rhinorrhea. Cardiovascular:   No chest pain or syncope. Respiratory:   No dyspnea or cough. Gastrointestinal:   Negative for abdominal pain, vomiting and diarrhea.  Musculoskeletal:   Negative for focal pain or swelling All other systems reviewed and are negative except as documented above in ROS and HPI.  ____________________________________________   PHYSICAL EXAM:  VITAL SIGNS: ED Triage Vitals  Enc Vitals Group     BP 05/19/17 1248 133/90     Pulse Rate 05/19/17 1248 77     Resp 05/19/17 1248 18     Temp 05/19/17 1248 97.8 F (36.6 C)     Temp Source 05/19/17 1248 Oral     SpO2 05/19/17  1248 95 %     Weight 05/19/17 1248 159 lb (72.1 kg)     Height 05/19/17 1248 6\' 3"  (1.905 m)     Head Circumference --      Peak Flow --      Pain Score 05/19/17 1232 0     Pain Loc --      Pain Edu? --      Excl. in [place]? --     Vital signs reviewed, nursing assessments reviewed.   Constitutional:   Alert and oriented. Well appearing and in no distress. Eyes:   No scleral icterus.  EOMI.[facility]   Head:   Normocephalic and atraumatic.   Nose:   No congestion/rhinnorhea.    Mouth/Throat:   MMM, no pharyngeal erythema. No peritonsillar mass.    Neck:   No meningismus. Full ROM. Hematological/Lymphatic/Immunilogical:   No cervical lymphadenopathy. Cardiovascular:   RRR. Symmetric bilateral radial and DP pulses.  No murmurs. pulsatile vascular access on the right upper extremity Respiratory:   Normal respiratory effort without tachypnea/retractions. Breath sounds are clear and equal bilaterally. No wheezes/rales/rhonchi. Gastrointestinal:   Soft and nontender. Non distended. There is no CVA tenderness.  No rebound, rigidity, or guarding. Genitourinary:   deferred Musculoskeletal:   Normal range of motion in all extremities. No joint effusions.  No lower extremity tenderness.  No edema. Neurologic:    Normal speech and language.  Motor grossly intact. No acute focal neurologic deficits are appreciated.  Skin:    Skin is warm, dry and intact. no inflammatory changes.  ____________________________________________    LABS (pertinent positives/negatives) (all labs ordered are listed, but only abnormal results are displayed) Labs Reviewed - No data to display ____________________________________________   EKG    ____________________________________________    RADIOLOGY  No results found.  ____________________________________________   PROCEDURES Procedures  ____________________________________________    CLINICAL IMPRESSION / ASSESSMENT AND PLAN / ED COURSE  Pertinent labs & imaging results that were available during my care of the patient were reviewed by me and considered in my medical decision making (see chart for details).   patient well-appearing no acute distress presents with normal vital signs and baseline state of health. He has no acute complaints and only comes to the ED to receive routine outpatient hemodialysis. I discussed with nephrology Dr. [name] will arrange dialysis today. Patient is suitable for discharge home afterward.   ----------------------------------------- 6:25 PM on 05/19/2017 -----------------------------------------  Return to ED after dialysis. No complaints. Completed session without any difficulty. Lungs clear to auscultation. Vascular access site hemostatic. Patient's eating a bag of Fritos. Stable for discharge home. Vital signs unremarkable.     ____________________________________________   FINAL CLINICAL IMPRESSION(S) / ED DIAGNOSES    Final diagnoses:  [name] [facility])     ED Discharge Orders    None      Portions of this note were generated with dragon dictation software. Dictation errors may occur despite best attempts at proofreading.    Carrie Mew, MD 05/19/17  1311    Carrie Mew, MD 05/19/17 [phone]

## 2017-05-19 NOTE — Progress Notes (Signed)
Pre HD assessment  

## 2017-05-19 NOTE — Progress Notes (Signed)
Post HD assessment. Pt tolerated tx well without c/o or complications. Pt requested to be taken off early r/t finding a ride home, MD made aware. Net UF 2512.

## 2017-05-19 NOTE — Progress Notes (Signed)
Post HD assessment  

## 2017-05-19 NOTE — Progress Notes (Signed)
HD tx start 

## 2017-05-19 NOTE — Progress Notes (Signed)
[facility], [place] 05/19/17  Subjective:  Patient presents for home to ER for dialysis Denies any acute complaints Last potassium level on March 6 showed hyperkalemia with potassium of 5.8 Phosphorus remains critically elevated at 10.7 today  Objective:  Vital signs in last 24 hours:  Temp:  [97.8 F (36.6 C)-97.9 F (36.6 C)] 97.9 F (36.6 C) (03/08 1433) Pulse Rate:  [75-88] 80 (03/08 1645) Resp:  [14-23] 18 (03/08 1645) BP: (105-151)/(72-90) 142/76 (03/08 1645) SpO2:  [94 %-100 %] 100 % (03/08 1645) Weight:  [72.1 kg (158 lb 15.9 oz)-72.1 kg (159 lb)] 72.1 kg (158 lb 15.9 oz) (03/08 1433)  Weight change:  Filed Weights   05/19/17 1248 05/19/17 1433  Weight: 72.1 kg (159 lb) 72.1 kg (158 lb 15.9 oz)    Intake/Output:   No intake or output data in the 24 hours ending 05/19/17 1657   Physical Exam: General: NAD laying in bed  HEENT Moist oral mucus membranes  Neck supple  Pulm/lungs Clear bilateral  CVS/Heart regular  Abdomen:  Soft, distended from ascites  Extremities: Trace  Neurologic: Resting quietly  Skin: No acute rashes,   Access: Right arm AV graft       Basic Metabolic Panel:  Recent Labs  Lab 05/17/17 1213 05/19/17 1313  NA 141  --   K 5.8*  --   CL 92*  --   CO2 30  --   GLUCOSE 94  --   BUN 63*  --   CREATININE 15.44*  --   CALCIUM 8.1*  --   PHOS 11.4* 10.7*     CBC: Recent Labs  Lab 05/17/17 1213  WBC 7.9  NEUTROABS 6.4  HGB 10.2*  HCT 33.3*  MCV 88.0  PLT 477*      Lab Results  Component Value Date   HEPBSAG Negative 03/01/2017   HEPBSAB Reactive 03/01/2017   HEPBIGM Negative 06/01/2016      Microbiology:  No results found for this or any previous visit (from the past 240 hour(s)).  Coagulation Studies: No results for input(s): LABPROT, INR in the last 72 hours.  Urinalysis: No results for input(s): COLORURINE, LABSPEC, PHURINE, GLUCOSEU, HGBUR, BILIRUBINUR, KETONESUR, PROTEINUR,  UROBILINOGEN, NITRITE, LEUKOCYTESUR in the last 72 hours.  Invalid input(s): APPERANCEUR    Imaging: No results found.   Medications:       Assessment/ Plan:  57 y.o. male with end stage renal disease on hemodialysis secondary to Al[place]'s syndrome, ascites, hypertension, anemia of chronic kidney disease, coronary artery disease, peripheral vascular disease, hyperlipidemia, gastrointestinal AVMs, echo in April 2018-> severe pulmonary hypertension, LVH, EF 50-55% moderate to severe mitral regurgitation, left atrial dilation, moderate tricuspid regurgitation likely leading to congestive hepatic insufficiency   [facility] [place] AVF- no outpatient dialysis unit currently  1.  Hyperkalemia 2.  Anemia of chronic kidney disease and GI Bleed 3.  Secondary Hyperparathyroidism 4.  Ascites 5.  End-stage renal disease  Plan for dialysis today with 2K bath as patient has been hyperkalemic in the recent past We will monitor phosphorus Patient continues to have low albumin and ascites from congestive hepatic insufficiency      LOS: 0 [place] [name] 3/8/20194:57 PM  [facility] [place], [place]

## 2017-05-19 NOTE — ED Triage Notes (Signed)
Pt here for routing dialysis treatment.

## 2017-05-22 ENCOUNTER — Ambulatory Visit: Admission: RE | Admit: 2017-05-22 | Payer: Medicare Other | Source: Ambulatory Visit

## 2017-05-23 IMAGING — US US PARACENTESIS
1 series · 4 of 4 positions shown · non-contrast
Comparison: July 19, 2014.

CLINICAL DATA: Ascites.

EXAM:
ULTRASOUND GUIDED PARACENTESIS

[Series 1: us paracentesis · 0.29mm/px · 4 of 4 slices shown]
[im 1/4]
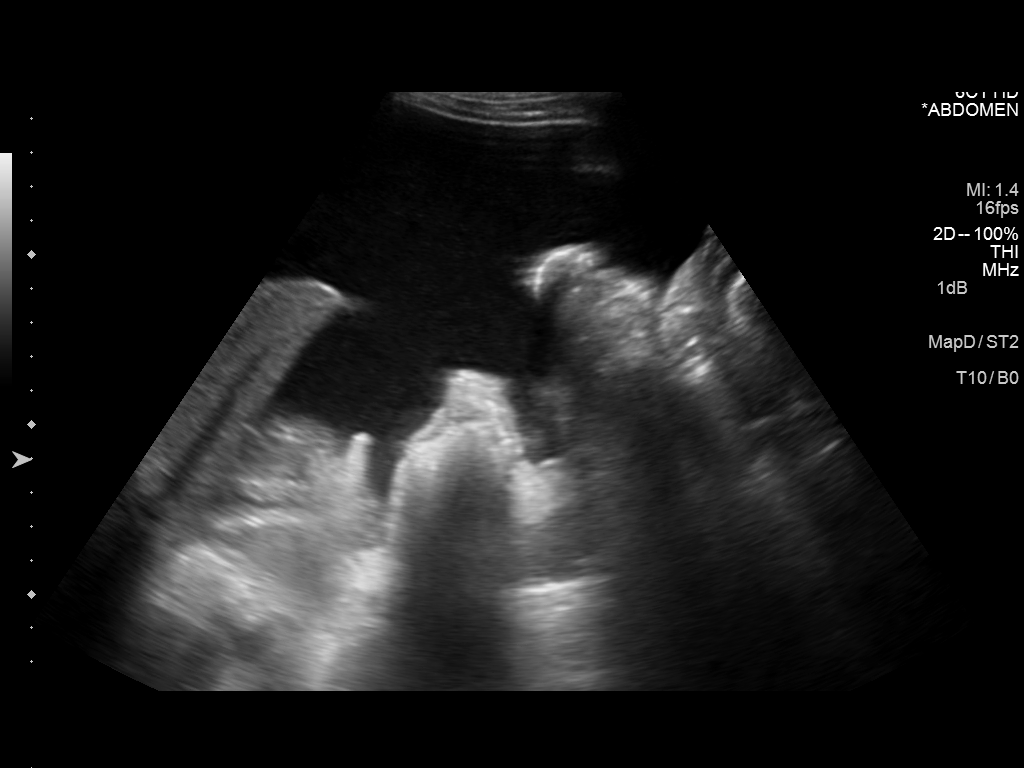
[im 2/4]
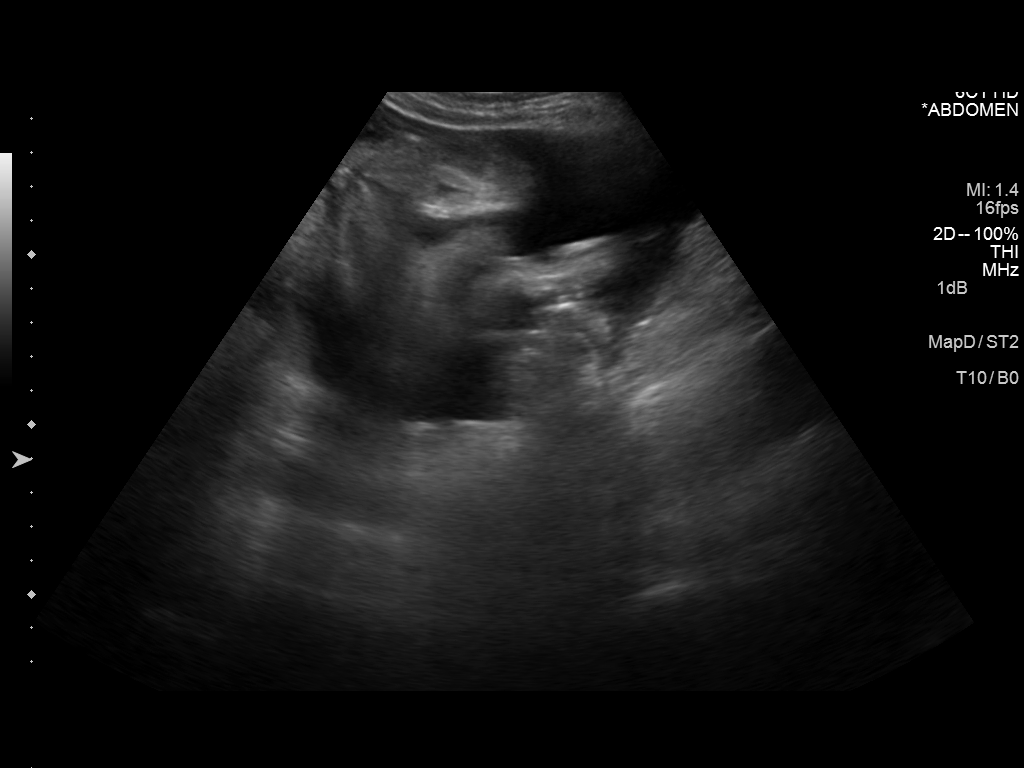
[im 3/4]
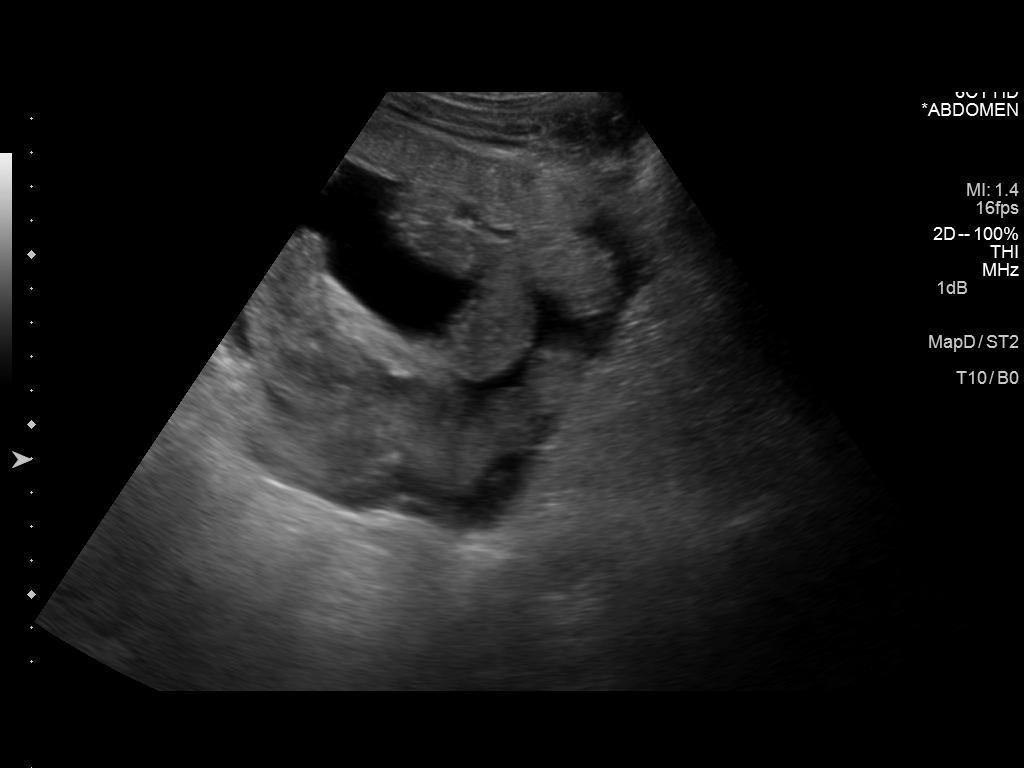
[im 4/4]
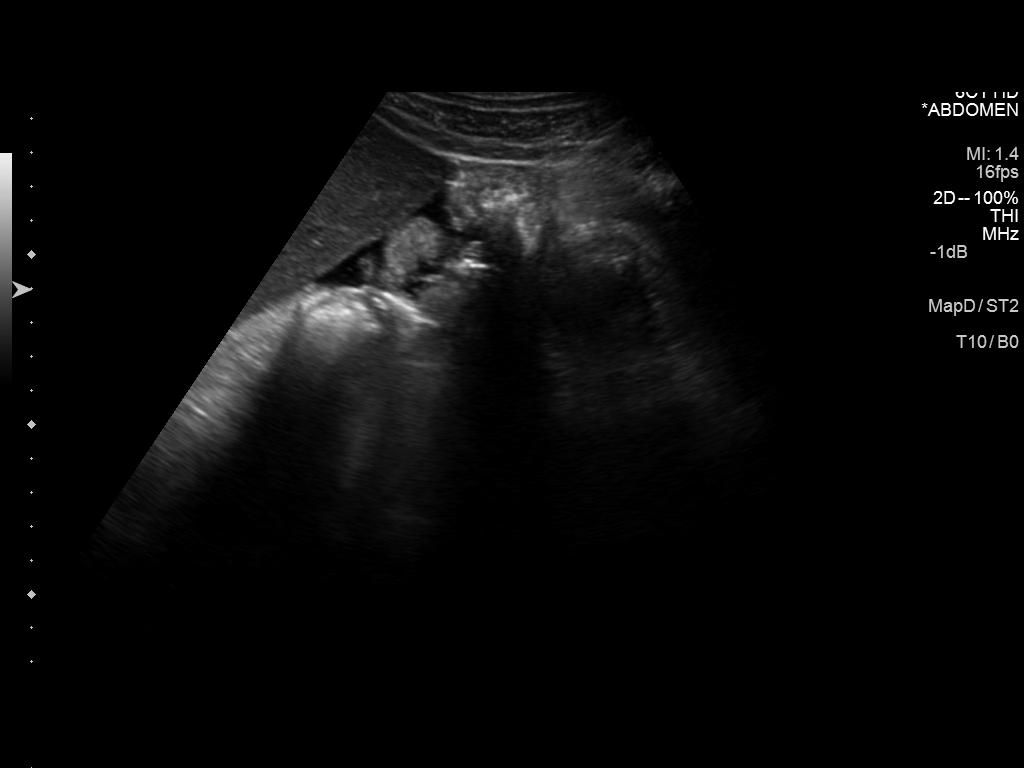

[4 of 4 positions shown; findings below may reference images not displayed]

PROCEDURE:
An ultrasound guided paracentesis was thoroughly discussed with the
patient and questions answered. The benefits, risks, alternatives
and complications were also discussed. The patient understands and
wishes to proceed with the procedure. Written consent was obtained.

Ultrasound was performed to localize and mark an adequate pocket of
fluid in the right lower quadrant of the abdomen. The area was then
prepped and draped in the normal sterile fashion. 1% Lidocaine was
used for local anesthesia. Under ultrasound guidance a
Safe-T-Centesis catheter was introduced. Paracentesis was performed.
The catheter was removed and a dressing applied.

COMPLICATIONS:
None immediate.
FINDINGS: A total of approximately 4.5 L of serous fluid was removed. A fluid
sample was not sent for laboratory analysis.
IMPRESSION: Successful ultrasound guided paracentesis yielding 4.5 L of ascites.

## 2017-05-24 ENCOUNTER — Observation Stay
Admission: EM | Admit: 2017-05-24 | Discharge: 2017-05-25 | Discharge status: Against Medical Advice | Payer: Medicare Other | Attending: Specialist | Admitting: Specialist

## 2017-05-24 ENCOUNTER — Other Ambulatory Visit: Payer: Self-pay

## 2017-05-24 DIAGNOSIS — I2510 Atherosclerotic heart disease of native coronary artery without angina pectoris: Secondary | ICD-10-CM | POA: Insufficient documentation

## 2017-05-24 DIAGNOSIS — I272 Pulmonary hypertension, unspecified: Secondary | ICD-10-CM | POA: Diagnosis not present

## 2017-05-24 DIAGNOSIS — Q8781 Alport syndrome: Secondary | ICD-10-CM | POA: Diagnosis not present

## 2017-05-24 DIAGNOSIS — I132 Hypertensive heart and chronic kidney disease with heart failure and with stage 5 chronic kidney disease, or end stage renal disease: Secondary | ICD-10-CM | POA: Insufficient documentation

## 2017-05-24 DIAGNOSIS — Z992 Dependence on renal dialysis: Secondary | ICD-10-CM | POA: Insufficient documentation

## 2017-05-24 DIAGNOSIS — F17210 Nicotine dependence, cigarettes, uncomplicated: Secondary | ICD-10-CM | POA: Insufficient documentation

## 2017-05-24 DIAGNOSIS — N186 End stage renal disease: Secondary | ICD-10-CM | POA: Insufficient documentation

## 2017-05-24 DIAGNOSIS — E1142 Type 2 diabetes mellitus with diabetic polyneuropathy: Secondary | ICD-10-CM | POA: Diagnosis not present

## 2017-05-24 DIAGNOSIS — E1151 Type 2 diabetes mellitus with diabetic peripheral angiopathy without gangrene: Secondary | ICD-10-CM | POA: Diagnosis not present

## 2017-05-24 DIAGNOSIS — K7460 Unspecified cirrhosis of liver: Secondary | ICD-10-CM | POA: Diagnosis not present

## 2017-05-24 DIAGNOSIS — E785 Hyperlipidemia, unspecified: Secondary | ICD-10-CM | POA: Diagnosis not present

## 2017-05-24 DIAGNOSIS — I509 Heart failure, unspecified: Secondary | ICD-10-CM | POA: Insufficient documentation

## 2017-05-24 DIAGNOSIS — J449 Chronic obstructive pulmonary disease, unspecified: Secondary | ICD-10-CM | POA: Diagnosis not present

## 2017-05-24 DIAGNOSIS — I120 Hypertensive chronic kidney disease with stage 5 chronic kidney disease or end stage renal disease: Secondary | ICD-10-CM | POA: Diagnosis not present

## 2017-05-24 DIAGNOSIS — D631 Anemia in chronic kidney disease: Secondary | ICD-10-CM | POA: Insufficient documentation

## 2017-05-24 DIAGNOSIS — R188 Other ascites: Secondary | ICD-10-CM | POA: Diagnosis not present

## 2017-05-24 DIAGNOSIS — N2581 Secondary hyperparathyroidism of renal origin: Secondary | ICD-10-CM | POA: Insufficient documentation

## 2017-05-24 DIAGNOSIS — Z7951 Long term (current) use of inhaled steroids: Secondary | ICD-10-CM | POA: Diagnosis not present

## 2017-05-24 DIAGNOSIS — K219 Gastro-esophageal reflux disease without esophagitis: Secondary | ICD-10-CM | POA: Diagnosis not present

## 2017-05-24 DIAGNOSIS — E875 Hyperkalemia: Principal | ICD-10-CM | POA: Diagnosis present

## 2017-05-24 DIAGNOSIS — K7011 Alcoholic hepatitis with ascites: Secondary | ICD-10-CM | POA: Diagnosis not present

## 2017-05-24 DIAGNOSIS — E1122 Type 2 diabetes mellitus with diabetic chronic kidney disease: Secondary | ICD-10-CM | POA: Diagnosis not present

## 2017-05-24 DIAGNOSIS — Z951 Presence of aortocoronary bypass graft: Secondary | ICD-10-CM | POA: Diagnosis not present

## 2017-05-24 LAB — BASIC METABOLIC PANEL
Anion gap: 14 (ref 5–15)
BUN: 69 mg/dL — ABNORMAL HIGH (ref 6–20)
CALCIUM: 8.7 mg/dL — AB (ref 8.9–10.3)
CHLORIDE: 94 mmol/L — AB (ref 101–111)
CO2: 28 mmol/L (ref 22–32)
CREATININE: 13.6 mg/dL — AB (ref 0.61–1.24)
GFR calc Af Amer: 4 mL/min — ABNORMAL LOW (ref >60)
GFR calc non Af Amer: 3 mL/min — ABNORMAL LOW (ref >60)
Glucose, Bld: 101 mg/dL — ABNORMAL HIGH (ref 65–99)
Potassium: 6.7 mmol/L (ref 3.5–5.1)
Sodium: 136 mmol/L (ref 135–145)

## 2017-05-24 LAB — CBC WITH DIFFERENTIAL/PLATELET
BASOS PCT: 1 %
Basophils Absolute: 0.1 10*3/uL (ref 0–0.1)
EOS ABS: 0.2 10*3/uL (ref 0–0.7)
Eosinophils Relative: 2 %
HEMATOCRIT: 25.5 % — AB (ref 40.0–52.0)
HEMOGLOBIN: 8 g/dL — AB (ref 13.0–18.0)
LYMPHS ABS: 0.8 10*3/uL — AB (ref 1.0–3.6)
Lymphocytes Relative: 9 %
MCH: 27 pg (ref 26.0–34.0)
MCHC: 31.4 g/dL — AB (ref 32.0–36.0)
MCV: 85.8 fL (ref 80.0–100.0)
MONOS PCT: 10 %
Monocytes Absolute: 0.8 10*3/uL (ref 0.2–1.0)
NEUTROS ABS: 7 10*3/uL — AB (ref 1.4–6.5)
Neutrophils Relative %: 78 %
Platelets: 287 10*3/uL (ref 150–440)
RBC: 2.98 MIL/uL — ABNORMAL LOW (ref 4.40–5.90)
RDW: 18.8 % — AB (ref 11.5–14.5)
WBC: 8.9 10*3/uL (ref 3.8–10.6)

## 2017-05-24 MED ORDER — ALBUTEROL SULFATE (2.5 MG/3ML) 0.083% IN NEBU: 3.0000 mL | INHALATION_SOLUTION | Freq: Four times a day (QID) | RESPIRATORY_TRACT | Status: DC | PRN

## 2017-05-24 MED ORDER — CEFAZOLIN SODIUM-DEXTROSE 2-4 GM/100ML-% IV SOLN
2.0000 g | INTRAVENOUS | Status: DC
  Filled 2017-05-24: qty 100

## 2017-05-24 MED ORDER — ALBUMIN HUMAN 5 % IV SOLN
25.0000 g | Freq: Once | INTRAVENOUS | Status: DC
  Filled 2017-05-24: qty 500

## 2017-05-24 MED ORDER — SENNOSIDES-DOCUSATE SODIUM 8.6-50 MG PO TABS: 1.0000 | ORAL_TABLET | Freq: Every evening | ORAL | Status: DC | PRN

## 2017-05-24 MED ORDER — PANTOPRAZOLE SODIUM 40 MG PO TBEC: 40.0000 mg | DELAYED_RELEASE_TABLET | Freq: Every day | ORAL | Status: DC

## 2017-05-24 MED ORDER — NITROGLYCERIN 0.4 MG SL SUBL: 0.4000 mg | SUBLINGUAL_TABLET | SUBLINGUAL | Status: DC | PRN

## 2017-05-24 MED ORDER — DIPHENHYDRAMINE HCL 12.5 MG/5ML PO ELIX
25.0000 mg | ORAL_SOLUTION | Freq: Three times a day (TID) | ORAL | Status: DC | PRN
  Administered 2017-05-24: 25 mg via ORAL
  Filled 2017-05-24 (×2): qty 10

## 2017-05-24 MED ORDER — ACETAMINOPHEN 325 MG PO TABS: 650.0000 mg | ORAL_TABLET | Freq: Four times a day (QID) | ORAL | Status: DC | PRN

## 2017-05-24 MED ORDER — ACETAMINOPHEN 650 MG RE SUPP: 650.0000 mg | Freq: Four times a day (QID) | RECTAL | Status: DC | PRN

## 2017-05-24 MED ORDER — LABETALOL HCL 100 MG PO TABS
100.0000 mg | ORAL_TABLET | Freq: Every day | ORAL | Status: DC
  Administered 2017-05-24: 100 mg via ORAL
  Filled 2017-05-24 (×2): qty 1

## 2017-05-24 MED ORDER — IPRATROPIUM-ALBUTEROL 0.5-2.5 (3) MG/3ML IN SOLN: 3.0000 mL | Freq: Four times a day (QID) | RESPIRATORY_TRACT | Status: DC | PRN

## 2017-05-24 MED ORDER — RENA-VITE PO TABS
1.0000 | ORAL_TABLET | Freq: Every day | ORAL | Status: DC
  Administered 2017-05-24: 1 via ORAL
  Filled 2017-05-24 (×2): qty 1

## 2017-05-24 MED ORDER — FERROUS SULFATE 325 (65 FE) MG PO TABS
325.0000 mg | ORAL_TABLET | Freq: Three times a day (TID) | ORAL | Status: DC
Start: 2017-05-24 — End: 2017-05-25
  Administered 2017-05-24: 325 mg via ORAL
  Filled 2017-05-24: qty 1

## 2017-05-24 MED ORDER — GABAPENTIN 300 MG PO CAPS
300.0000 mg | ORAL_CAPSULE | Freq: Every day | ORAL | Status: DC
Start: 2017-05-24 — End: 2017-05-25
  Administered 2017-05-24: 300 mg via ORAL
  Filled 2017-05-24: qty 1

## 2017-05-24 MED ORDER — ONDANSETRON HCL 4 MG/2ML IJ SOLN
4.0000 mg | Freq: Four times a day (QID) | INTRAMUSCULAR | Status: DC | PRN
Start: 2017-05-24 — End: 2017-05-25

## 2017-05-24 MED ORDER — SODIUM CHLORIDE 0.9 % IV SOLN: 250.0000 mL | INTRAVENOUS | Status: DC | PRN

## 2017-05-24 MED ORDER — SODIUM CHLORIDE 0.9 % IV SOLN
INTRAVENOUS | Status: DC
  Administered 2017-05-24 – 2017-05-25 (×2): via INTRAVENOUS

## 2017-05-24 MED ORDER — SODIUM CHLORIDE 0.9% FLUSH: 3.0000 mL | INTRAVENOUS | Status: DC | PRN

## 2017-05-24 MED ORDER — ONDANSETRON HCL 4 MG PO TABS: 4.0000 mg | ORAL_TABLET | Freq: Four times a day (QID) | ORAL | Status: DC | PRN

## 2017-05-24 MED ORDER — HEPARIN SODIUM (PORCINE) 5000 UNIT/ML IJ SOLN
5000.0000 [IU] | Freq: Three times a day (TID) | INTRAMUSCULAR | Status: DC
  Administered 2017-05-24 – 2017-05-25 (×2): 5000 [IU] via SUBCUTANEOUS
  Filled 2017-05-24 (×2): qty 1

## 2017-05-24 MED ORDER — DIPHENHYDRAMINE HCL 50 MG/ML IJ SOLN
25.0000 mg | Freq: Four times a day (QID) | INTRAMUSCULAR | Status: DC | PRN
  Administered 2017-05-24: 25 mg via INTRAVENOUS

## 2017-05-24 MED ORDER — OXYCODONE-ACETAMINOPHEN 5-325 MG PO TABS
1.0000 | ORAL_TABLET | ORAL | Status: DC | PRN
  Administered 2017-05-24 – 2017-05-25 (×4): 1 via ORAL
  Filled 2017-05-24 (×5): qty 1

## 2017-05-24 MED ORDER — CALCIUM ACETATE (PHOS BINDER) 667 MG PO CAPS: 2001.0000 mg | ORAL_CAPSULE | Freq: Three times a day (TID) | ORAL | Status: DC

## 2017-05-24 MED ORDER — TIOTROPIUM BROMIDE MONOHYDRATE 18 MCG IN CAPS
18.0000 ug | ORAL_CAPSULE | Freq: Every day | RESPIRATORY_TRACT | Status: DC
  Administered 2017-05-25: 18 ug via RESPIRATORY_TRACT
  Filled 2017-05-24: qty 5

## 2017-05-24 MED ORDER — FUROSEMIDE 40 MG PO TABS
80.0000 mg | ORAL_TABLET | Freq: Every day | ORAL | Status: DC
  Administered 2017-05-24: 80 mg via ORAL
  Filled 2017-05-24: qty 2

## 2017-05-24 MED ORDER — MOMETASONE FURO-FORMOTEROL FUM 200-5 MCG/ACT IN AERO
2.0000 | INHALATION_SPRAY | Freq: Two times a day (BID) | RESPIRATORY_TRACT | Status: DC
  Administered 2017-05-24 – 2017-05-25 (×2): 2 via RESPIRATORY_TRACT
  Filled 2017-05-24: qty 8.8

## 2017-05-24 MED ORDER — ATORVASTATIN CALCIUM 20 MG PO TABS: 40.0000 mg | ORAL_TABLET | Freq: Every day | ORAL | Status: DC

## 2017-05-24 MED ORDER — SODIUM CHLORIDE 0.9% FLUSH: 3.0000 mL | Freq: Two times a day (BID) | INTRAVENOUS | Status: DC

## 2017-05-24 NOTE — ED Notes (Signed)
Pt transported to dialysis

## 2017-05-24 NOTE — ED Triage Notes (Signed)
Pt is here for his routine dialysis treatment, pt denies any pain or illness on arrival.

## 2017-05-24 NOTE — Progress Notes (Signed)
HD Tx ended  

## 2017-05-24 NOTE — H&P (Signed)
[place] [facility] [place] NAME: Stanley [name]    MR#:  [id number]  DATE OF BIRTH:  09/11/1960  DATE OF ADMISSION:  05/24/2017  PRIMARY CARE PHYSICIAN: [name], MD   REQUESTING/REFERRING PHYSICIAN:   CHIEF COMPLAINT:   For dialysis And abdominal distension  HISTORY OF PRESENT ILLNESS: Stanley [name]  is a 57 y.o. male with a known history of congestive heart failure, cirrhosis of liver, coronary artery disease, diabetic neuropathy, end-stage renal disease on dialysis presented to the emergency room for abdominal distention.  Patient gets to the emergency room for dialysis 3 times a week.  He was evaluated by nephrology today and started on dialysis.  Dialysis catheter initially gave problem and later on dilaysis resumed. Has abdominal distension secondary to ascites. Potassium elevated during work up in [place]. Patient seen and evaluated at dialysis unit.No chest pain, palpitations. No fever, chills.  PAST MEDICAL HISTORY:   Past Medical History:  Diagnosis Date  . Alcohol abuse   . CHF (congestive heart failure) ([place])   . Cirrhosis ([place])   . Coronary artery disease 2009  . Diabetic peripheral neuropathy ([employer])   . Drug abuse ([place])   . End stage renal disease on dialysis [facility]) NEPHROLOGIST-   [facility]  IN [place]   HEMODIALYSIS --   TUES/  THURS/  SAT  . GERD (gastroesophageal reflux disease)   . Hyperlipidemia   . Hypertension   . PAD (peripheral artery disease) ([place])   . Renal insufficiency    Per pt, 32 oz fluid restriction per day  . S/P triple vessel bypass 06/09/2016   2009ish  . Suicidal ideation    & HOMICIDAL IDEATION --  06-16-2013   ADMITTED TO BEHAVIOR HEALTH    PAST SURGICAL HISTORY:  Past Surgical History:  Procedure Laterality Date  . AGILE CAPSULE N/A 06/19/2016   Procedure: AGILE CAPSULE;  Surgeon: [name], MD;  Location: [facility] ENDOSCOPY;  Service: Endoscopy;  Laterality: N/A;  . COLONOSCOPY WITH  PROPOFOL N/A 06/18/2016   Procedure: COLONOSCOPY WITH PROPOFOL;  Surgeon: [name], MD;  Location: [facility] ENDOSCOPY;  Service: Endoscopy;  Laterality: N/A;  . COLONOSCOPY WITH PROPOFOL N/A 08/12/2016   Procedure: COLONOSCOPY WITH PROPOFOL;  Surgeon: [name], MD;  Location: [facility] ENDOSCOPY;  Service: Endoscopy;  Laterality: N/A;  . COLONOSCOPY WITH PROPOFOL N/A 05/05/2017   Procedure: COLONOSCOPY WITH PROPOFOL;  Surgeon: Manya Silvas, MD;  Location: [facility] ENDOSCOPY;  Service: Endoscopy;  Laterality: N/A;  . CORONARY ANGIOPLASTY  ?   PT UNABLE TO TELL IF  BEFORE OR AFTER  CABG  . CORONARY ARTERY BYPASS GRAFT  2008  ([place])   3 VESSEL  . DIALYSIS FISTULA CREATION  LAST SURGERY  APPOX  2008  . ENTEROSCOPY N/A 05/10/2016   Procedure: ENTEROSCOPY;  Surgeon: Jerene Bears, MD;  Location: [place];  Service: Gastroenterology;  Laterality: N/A;  . ENTEROSCOPY N/A 08/12/2016   Procedure: ENTEROSCOPY;  Surgeon: [name], MD;  Location: [facility] ENDOSCOPY;  Service: Endoscopy;  Laterality: N/A;  . ESOPHAGOGASTRODUODENOSCOPY N/A 05/07/2015   Procedure: ESOPHAGOGASTRODUODENOSCOPY (EGD);  Surgeon: Hulen Luster, MD;  Location: [facility] ENDOSCOPY;  Service: Endoscopy;  Laterality: N/A;  . ESOPHAGOGASTRODUODENOSCOPY (EGD) WITH PROPOFOL N/A 05/17/2015   Procedure: ESOPHAGOGASTRODUODENOSCOPY (EGD) WITH PROPOFOL;  Surgeon: [name], MD;  Location: [facility] ENDOSCOPY;  Service: Endoscopy;  Laterality: N/A;  . ESOPHAGOGASTRODUODENOSCOPY (EGD) WITH PROPOFOL N/A 01/20/2016   Procedure: ESOPHAGOGASTRODUODENOSCOPY (EGD) WITH PROPOFOL;  Surgeon: [name],  MD;  Location: [facility] ENDOSCOPY;  Service: Endoscopy;  Laterality: N/A;  . ESOPHAGOGASTRODUODENOSCOPY (EGD) WITH PROPOFOL N/A 04/17/2016   Procedure: ESOPHAGOGASTRODUODENOSCOPY (EGD) WITH PROPOFOL;  Surgeon: Lin Landsman, MD;  Location: [facility] ENDOSCOPY;  Service: Gastroenterology;  Laterality: N/A;  . ESOPHAGOGASTRODUODENOSCOPY (EGD) WITH PROPOFOL  05/09/2016    Procedure: ESOPHAGOGASTRODUODENOSCOPY (EGD) WITH PROPOFOL;  Surgeon: Jerene Bears, MD;  Location: [place];  Service: Endoscopy;;  . ESOPHAGOGASTRODUODENOSCOPY (EGD) WITH PROPOFOL N/A 06/16/2016   Procedure: ESOPHAGOGASTRODUODENOSCOPY (EGD) WITH PROPOFOL;  Surgeon: [name], MD;  Location: [facility] ENDOSCOPY;  Service: Endoscopy;  Laterality: N/A;  . ESOPHAGOGASTRODUODENOSCOPY (EGD) WITH PROPOFOL N/A 05/05/2017   Procedure: ESOPHAGOGASTRODUODENOSCOPY (EGD) WITH PROPOFOL;  Surgeon: Manya Silvas, MD;  Location: [facility] ENDOSCOPY;  Service: Endoscopy;  Laterality: N/A;  . GIVENS CAPSULE STUDY N/A 05/07/2016   Procedure: GIVENS CAPSULE STUDY;  Surgeon: Doran Stabler, MD;  Location: [place];  Service: Endoscopy;  Laterality: N/A;  . MANDIBULAR HARDWARE REMOVAL N/A 07/29/2013   Procedure: REMOVAL OF ARCH BARS;  Surgeon: Theodoro Kos, DO;  Location: [place];  Service: Plastics;  Laterality: N/A;  . ORIF MANDIBULAR FRACTURE N/A 06/05/2013   Procedure: REPAIR OF MANDIBULAR FRACTURE x 2 with maxillo-mandibular fixation ;  Surgeon: Theodoro Kos, DO;  Location: [place];  Service: Plastics;  Laterality: N/A;  . PERIPHERAL ARTERIAL STENT GRAFT Left     SOCIAL HISTORY:  Social History   Tobacco Use  . Smoking status: Current Every Day Smoker    Packs/day: 0.15    Years: 40.00    Pack years: 6.00    Types: Cigarettes  . Smokeless tobacco: Never Used  Substance Use Topics  . Alcohol use: No    Comment: pt reports quitting after learning about cirrhosis    FAMILY HISTORY:  Family History  Problem Relation Age of Onset  . Colon cancer Mother   . Cancer Father   . Cancer Sister   . Kidney disease Brother     DRUG ALLERGIES: No Known Allergies  REVIEW OF SYSTEMS:   CONSTITUTIONAL: No fever, fatigue or weakness.  EYES: No blurred or double vision.  EARS, NOSE, AND THROAT: No tinnitus or ear pain.  RESPIRATORY: No cough, shortness of breath, wheezing or hemoptysis.   CARDIOVASCULAR: No chest pain, orthopnea, edema.  GASTROINTESTINAL: No nausea, vomiting, diarrhea or abdominal pain.  Has abdominal distension. GENITOURINARY: No dysuria, hematuria.  ENDOCRINE: No polyuria, nocturia,  HEMATOLOGY: No anemia, easy bruising or bleeding SKIN: No rash or lesion. MUSCULOSKELETAL: No joint pain or arthritis.   NEUROLOGIC: No tingling, numbness, weakness.  PSYCHIATRY: No anxiety or depression.   MEDICATIONS AT HOME:  Prior to Admission medications   Medication Sig Start Date End Date Taking? Authorizing Provider  albuterol (PROVENTIL HFA;VENTOLIN HFA) 108 (90 Base) MCG/ACT inhaler Inhale 2 puffs into the lungs every 6 (six) hours as needed for wheezing or shortness of breath. 04/21/17   Lisa Roca, MD  alum & mag hydroxide-simeth (MAALOX/MYLANTA) 200-200-20 MG/5ML suspension Take 15 mLs every 4 (four) hours as needed by mouth for indigestion or heartburn. Patient not taking: Reported on 04/19/2017 01/25/17   Demetrios Loll, MD  atorvastatin (LIPITOR) 40 MG tablet Take 1 tablet (40 mg total) by mouth daily. 11/16/16   Vaughan Basta, MD  budesonide-formoterol (SYMBICORT) 160-4.5 MCG/ACT inhaler Inhale 2 puffs into the lungs daily. 11/16/16   Vaughan Basta, MD  calcium acetate (PHOSLO) 667 MG capsule Take 2,001 mg by mouth 3 (three) times daily.    [provider]  ferrous sulfate 325 (65 FE) MG tablet Take 1 tablet by mouth 3 (three) times daily. 01/21/17   [provider]  furosemide (LASIX) 80 MG tablet Take 1 tablet (80 mg total) daily by mouth. 01/25/17   Demetrios Loll, MD  gabapentin (NEURONTIN) 300 MG capsule Take 1 capsule by mouth daily as directed 08/16/16   [provider]  ipratropium-albuterol (DUONEB) 0.5-2.5 (3) MG/3ML SOLN Take 3 mLs by nebulization every 6 (six) hours as needed. 11/28/16   Loletha Grayer, MD  labetalol (NORMODYNE) 100 MG tablet Take 100 mg by mouth daily. 03/24/17   [provider]  lactulose  (CHRONULAC) 10 GM/15ML solution Take 30 mLs (20 g total) by mouth 3 (three) times daily. Patient not taking: Reported on 04/05/2017 01/11/17   Bettey Costa, MD  Multiple Vitamins-Minerals-FA (DIALYVITE SUPREME D) 3 MG TABS Take 1 tablet (3 mg total) by mouth daily. 11/28/16   Loletha Grayer, MD  nitroGLYCERIN (NITROSTAT) 0.4 MG SL tablet Place 1 tablet (0.4 mg total) under the tongue every 5 (five) minutes as needed. 04/12/16   Wende Bushy, MD  pantoprazole (PROTONIX) 40 MG tablet Take 1 tablet (40 mg total) by mouth daily. 11/28/16   Loletha Grayer, MD  Spacer/Aero Chamber Mouthpiece MISC 1 Units by Does not apply route every 4 (four) hours as needed (wheezing). 02/19/17   Darel Hong, MD  tiotropium (SPIRIVA HANDIHALER) 18 MCG inhalation capsule Place 1 capsule (18 mcg total) into inhaler and inhale daily. 11/16/16 11/16/17  Vaughan Basta, MD      PHYSICAL EXAMINATION:   VITAL SIGNS: Blood pressure 131/85, pulse 83, temperature 99.1 F (37.3 C), temperature source Oral, resp. rate 19, height 6\' 3"  (1.905 m), weight 70.3 kg (155 lb), SpO2 100 %.  GENERAL:  57 y.o.-year-old patient lying in the bed with no acute distress.  EYES: Pupils equal, round, reactive to light and accommodation. No scleral icterus. Extraocular muscles intact.  HEENT: Head atraumatic, normocephalic. Oropharynx and nasopharynx clear.  NECK:  Supple, no jugular venous distention. No thyroid enlargement, no tenderness.  LUNGS: Normal breath sounds bilaterally, no wheezing, rales,rhonchi or crepitation. No use of accessory muscles of respiration.  CARDIOVASCULAR: S1, S2 normal. No murmurs, rubs, or gallops.  ABDOMEN: Soft, nontender, distended. Bowel sounds present. No organomegaly or mass.  Ascites present. EXTREMITIES: No pedal edema, cyanosis, or clubbing.  NEUROLOGIC: Cranial nerves II through XII are intact. Muscle strength 5/5 in all extremities. Sensation intact. Gait not checked.  PSYCHIATRIC: The patient  is alert and oriented x 3.  SKIN: No obvious rash, lesion, or ulcer.   LABORATORY PANEL:   CBC Recent Labs  Lab 05/24/17 1325  WBC 8.9  HGB 8.0*  HCT 25.5*  PLT 287  MCV 85.8  MCH 27.0  MCHC 31.4*  RDW 18.8*  LYMPHSABS 0.8*  MONOABS 0.8  EOSABS 0.2  BASOSABS 0.1   ------------------------------------------------------------------------------------------------------------------  Chemistries  Recent Labs  Lab 05/24/17 1325  NA 136  K 6.7*  CL 94*  CO2 28  GLUCOSE 101*  BUN 69*  CREATININE 13.60*  CALCIUM 8.7*   ------------------------------------------------------------------------------------------------------------------ estimated creatinine clearance is 5.9 mL/min (A) (by C-G formula based on SCr of 13.6 mg/dL (H)). ------------------------------------------------------------------------------------------------------------------ No results for input(s): TSH, T4TOTAL, T3FREE, THYROIDAB in the last 72 hours.  Invalid input(s): FREET3   Coagulation profile No results for input(s): INR, PROTIME in the last 168 hours. ------------------------------------------------------------------------------------------------------------------- No results for input(s): DDIMER in the last 72 hours. -------------------------------------------------------------------------------------------------------------------  Cardiac Enzymes No results for input(s): CKMB,  TROPONINI, MYOGLOBIN in the last 168 hours.  Invalid input(s): CK ------------------------------------------------------------------------------------------------------------------ Invalid input(s): POCBNP  ---------------------------------------------------------------------------------------------------------------  Urinalysis    Component Value Date/Time   COLORURINE YELLOW (A) 05/03/2015 0815   APPEARANCEUR CLEAR (A) 05/03/2015 0815   APPEARANCEUR Clear 11/13/2013 1314   LABSPEC 1.008 05/03/2015 0815    LABSPEC 1.014 11/13/2013 1314   PHURINE 9.0 (H) 05/03/2015 0815   GLUCOSEU NEGATIVE 05/03/2015 0815   GLUCOSEU 50 mg/dL 11/13/2013 1314   HGBUR NEGATIVE 05/03/2015 0815   BILIRUBINUR NEGATIVE 05/03/2015 0815   BILIRUBINUR Negative 11/13/2013 1314   KETONESUR NEGATIVE 05/03/2015 0815   PROTEINUR 100 (A) 05/03/2015 0815   UROBILINOGEN 0.2 06/22/2014 1230   NITRITE NEGATIVE 05/03/2015 0815   LEUKOCYTESUR NEGATIVE 05/03/2015 0815   LEUKOCYTESUR Negative 11/13/2013 1314     RADIOLOGY: No results found.  EKG: Orders placed or performed during the hospital encounter of 05/17/17  . ED EKG  . ED EKG  . EKG    IMPRESSION AND PLAN:  57 year old male patient with history of end-stage renal disease on dialysis, hypertension, cirrhosis of liver, neuropathy, coronary artery disease presents to ER for dialysis and abdominal distension.  1.  Fluid overload with ascites Admit patient to medical floor Ultrasound guided paracentesis in [place] Albumin infusion prior to dialysis  2. Hyperkalemia Dialysis today Follow potassium level  3.  End Stage Renal Disease continue Nephrology consultation  4.  Coronary artery disease continue medical management  5.  Anemia of chronic disease Monitor hemoglobin hematocrit  All the records are reviewed and case discussed with ED provider. Management plans discussed with the patient, family and they are in agreement.  CODE STATUS:FULL CODE Code Status History    Date Active Date Inactive Code Status Order ID Comments User Context   05/03/2017 13:31 05/05/2017 18:06 Full Code [id number]  Bettey Costa, MD Inpatient   04/01/2017 09:47 04/03/2017 11:56 Full Code [id number]  Loletha Grayer, MD ED   03/09/2017 00:10 03/09/2017 19:28 Full Code [id number]  Lance Coon, MD Inpatient   03/03/2017 20:27 03/04/2017 19:20 Full Code [id number]  Demetrios Loll, MD Inpatient   02/28/2017 22:54 03/02/2017 15:19 Full Code [id number]  Lance Coon, MD Inpatient   02/22/2017  17:21 02/24/2017 13:59 Full Code [id number]  Dustin Flock, MD Inpatient   01/22/2017 23:37 01/25/2017 15:22 Full Code [id number]  Gorden Harms, MD Inpatient   01/07/2017 14:45 01/13/2017 18:12 Full Code [id number]  Idelle Crouch, MD Inpatient   12/22/2016 14:31 12/24/2016 18:04 Full Code [id number]  Hillary Bow, MD ED   12/20/2016 20:20 12/21/2016 18:53 Full Code [id number]  Demetrios Loll, MD Inpatient   11/26/2016 10:30 11/28/2016 15:22 Full Code [id number]  Henreitta Leber, MD Inpatient   11/15/2016 19:30 11/16/2016 18:22 Full Code [id number]  Fritzi Mandes, MD Inpatient   10/29/2016 14:53 10/30/2016 15:06 Full Code [id number]  Loletha Grayer, MD ED   09/15/2016 17:57 09/16/2016 18:17 Full Code [id number]  Dustin Flock, MD ED   08/16/2016 09:45 08/18/2016 18:16 Full Code [id number]  Hillary Bow, MD ED   08/09/2016 16:59 08/12/2016 20:34 Full Code [id number]  Fritzi Mandes, MD Inpatient   07/03/2016 21:54 07/05/2016 21:22 Full Code [id number]  Demetrios Loll, MD Inpatient   06/24/2016 04:23 06/26/2016 15:46 Full Code [id number]  Saundra Shelling, MD Inpatient   06/14/2016 14:53 06/19/2016 21:04 Full Code [id number]  Hillary Bow, MD ED   05/31/2016 06:42 06/01/2016 21:30 Full Code [id number]  Saundra Shelling, MD Inpatient   05/05/2016 23:09 05/10/2016 21:57 Full Code [id number]  Vianne Bulls, MD  Inpatient   04/16/2016 03:56 04/17/2016 18:27 Full Code [id number]  Saundra Shelling, MD Inpatient   01/19/2016 15:13 01/22/2016 14:14 Full Code [id number]  Loletha Grayer, MD ED   09/24/2015 10:33 09/25/2015 13:31 Full Code [id number]  Loletha Grayer, MD ED   05/16/2015 14:17 05/17/2015 20:57 Full Code [id number]  Idelle Crouch, MD Inpatient   04/30/2015 20:08 05/08/2015 19:40 Full Code [id number]  Vaughan Basta, MD Inpatient   04/06/2015 05:07 04/07/2015 17:36 Full Code [id number]  Lance Coon, MD Inpatient   03/23/2015 11:07 03/27/2015 12:46 Full Code [id number]  Bettey Costa, MD Inpatient   06/19/2013 11:32 06/21/2013 17:49 Full Code  [id number]  Cristal Ford, DO Inpatient   06/16/2013 17:26 06/19/2013 11:32 Full Code [id number]  Margarita Mail, PA-C ED   06/05/2013 15:33 06/06/2013 18:56 Full Code [id number]  Theodoro Kos, DO Inpatient   06/02/2013 09:01 06/05/2013 15:33 Full Code [id number]  Velvet Bathe, MD Inpatient   06/02/2013 08:40 06/02/2013 09:01 Full Code [id number]  Velvet Bathe, MD Inpatient       TOTAL TIME TAKING CARE OF THIS PATIENT: 50 minutes.    Saundra Shelling M.D on 05/24/2017 at 3:25 PM  Between 7am to 6pm - Pager - [phone]  After 6pm go to [email or web address] - password [email or web address] [facility]  Office  [phone]  CC: Primary care physician; [name], MD

## 2017-05-24 NOTE — Progress Notes (Signed)
Post HD Tx   05/24/17 1630  Hand-Off documentation  Report given to (Full Name) Rush Landmark, RN   Report received from (Full Name) Beatris Ship, Rn   Vital Signs  Temp 98.7 F (37.1 C)  Temp Source Oral  Pulse Rate 87  Pulse Rate Source Monitor  Resp 13  BP 128/77  BP Location Left Arm  BP Method Automatic  Patient Position (if appropriate) Sitting  Oxygen Therapy  SpO2 96 %  O2 Device Nasal Cannula  O2 Flow Rate (L/min) 2 L/min  Pain Assessment  Pain Assessment No/denies pain  Pain Score 0  Dialysis Weight  Weight (unable to weigh pt in chair )  Fistula / Graft Right Upper arm Arteriovenous fistula  No Placement Date or Time found.   Orientation: Right  Access Location: Upper arm  Access Type: Arteriovenous fistula  Site Condition No complications  Fistula / Graft Assessment Present;Thrill;Bruit

## 2017-05-24 NOTE — Progress Notes (Signed)
[facility]  ROUNDING NOTE   Subjective:   Mr. Audel Coakley comes to the ED with shortness of breath and fluid overload. Hyperkalemia.  Last hemodialysis was 3/8  Placed on hemodialysis. Plan to admit patient for volume overload.    Objective:  Vital signs in last 24 hours:  Temp:  [99.1 F (37.3 C)] 99.1 F (37.3 C) (03/13 1242) Pulse Rate:  [78-88] 84 (03/13 1500) Resp:  [18-28] 22 (03/13 1500) BP: (124-148)/(71-100) 140/85 (03/13 1500) SpO2:  [96 %-100 %] 100 % (03/13 1500) Weight:  [70.3 kg (155 lb)] 70.3 kg (155 lb) (03/13 1141)  Weight change:  Filed Weights   05/24/17 1141  Weight: 70.3 kg (155 lb)    Intake/Output: No intake/output data recorded.   Intake/Output this shift:  No intake/output data recorded.  Physical Exam: General: NAD,   Head: Normocephalic, atraumatic. Moist oral mucosal membranes  Eyes: Anicteric, PERRL  Neck: Supple, trachea midline  Lungs:  Clear to auscultation  Heart: Regular rate and rhythm  Abdomen:  Soft, nontender,   Extremities:  no peripheral edema.  Neurologic: Nonfocal, moving all four extremities  Skin: No lesions  Access: AVF     Basic Metabolic Panel: Recent Labs  Lab 05/19/17 1313 05/24/17 1325  NA  --  136  K  --  6.7*  CL  --  94*  CO2  --  28  GLUCOSE  --  101*  BUN  --  69*  CREATININE  --  13.60*  CALCIUM  --  8.7*  PHOS 10.7*  --     Liver Function Tests: No results for input(s): AST, ALT, ALKPHOS, BILITOT, PROT, ALBUMIN in the last 168 hours. No results for input(s): LIPASE, AMYLASE in the last 168 hours. No results for input(s): AMMONIA in the last 168 hours.  CBC: Recent Labs  Lab 05/24/17 1325  WBC 8.9  NEUTROABS 7.0*  HGB 8.0*  HCT 25.5*  MCV 85.8  PLT 287    Cardiac Enzymes: No results for input(s): CKTOTAL, CKMB, CKMBINDEX, TROPONINI in the last 168 hours.  BNP: Invalid input(s): POCBNP  CBG: No results for input(s): GLUCAP in the last 168  hours.  Microbiology: Results for orders placed or performed during the hospital encounter of 05/03/17  MRSA PCR Screening     Status: None   Collection Time: 05/03/17  1:35 PM  Result Value Ref Range Status   MRSA by PCR NEGATIVE NEGATIVE Final    Comment:        The GeneXpert MRSA Assay (FDA approved for NASAL specimens only), is one component of a comprehensive MRSA colonization surveillance program. It is not intended to diagnose MRSA infection nor to guide or monitor treatment for MRSA infections. Performed at [facility], [address] [place], [place] [address]     Coagulation Studies: No results for input(s): LABPROT, INR in the last 72 hours.  Urinalysis: No results for input(s): COLORURINE, LABSPEC, PHURINE, GLUCOSEU, HGBUR, BILIRUBINUR, KETONESUR, PROTEINUR, UROBILINOGEN, NITRITE, LEUKOCYTESUR in the last 72 hours.  Invalid input(s): APPERANCEUR    Imaging: No results found.   Medications:     diphenhydrAMINE  Assessment/ Plan:  Mr. Gwin Eagon is a 57 y.o. black male withend stage renal disease on hemodialysis secondary to Alport's syndrome, ascites, hypertension, anemia of chronic kidney disease, coronary artery disease, peripheral vascular disease, hyperlipidemia, gastrointestinal AVMs, pulmonary hypertension,   nooutpatientdialysis unit currently  1.  Hyperkalemia 2.  Anemia of chronic kidney disease and GI Bleed 3.  Secondary Hyperparathyroidism 4.  Ascites 5.  End-stage renal disease  Plan for dialysis today with 1K bath  - Recommend inpatient admission for ultrasound guided paracentesis  - Fistulogram as per vascular surgery.    LOS: 0 Joylene Wescott 3/13/20193:16 PM

## 2017-05-24 NOTE — Progress Notes (Signed)
Pre HD Assessment  

## 2017-05-24 NOTE — Progress Notes (Signed)
Pt c/o SOB, MD Kolluru Notified, ordered 2L o2, patient sitting up in chair for add'l relief. Pt has abdominal swelling and missed his scheduled paracentesis. O2 sat improved from 91% to 100% with intervention.

## 2017-05-24 NOTE — Progress Notes (Signed)
Notified MD of pt requesting liquid benadryl for itching.  Orders placed. Will continue to monitor and assess.
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
## 2017-05-24 NOTE — Progress Notes (Signed)
Post HD Assessment 

## 2017-05-25 ENCOUNTER — Observation Stay: Payer: Medicare Other

## 2017-05-25 ENCOUNTER — Encounter
Admission: EM | Discharge status: Against Medical Advice | Payer: Self-pay | Source: Home / Self Care | Attending: Emergency Medicine

## 2017-05-25 DIAGNOSIS — R188 Other ascites: Secondary | ICD-10-CM | POA: Diagnosis not present

## 2017-05-25 DIAGNOSIS — E875 Hyperkalemia: Secondary | ICD-10-CM | POA: Diagnosis not present

## 2017-05-25 DIAGNOSIS — N2581 Secondary hyperparathyroidism of renal origin: Secondary | ICD-10-CM | POA: Diagnosis not present

## 2017-05-25 DIAGNOSIS — N186 End stage renal disease: Secondary | ICD-10-CM | POA: Diagnosis not present

## 2017-05-25 DIAGNOSIS — D631 Anemia in chronic kidney disease: Secondary | ICD-10-CM | POA: Diagnosis not present

## 2017-05-25 DIAGNOSIS — I2510 Atherosclerotic heart disease of native coronary artery without angina pectoris: Secondary | ICD-10-CM | POA: Diagnosis not present

## 2017-05-25 DIAGNOSIS — K7460 Unspecified cirrhosis of liver: Secondary | ICD-10-CM | POA: Diagnosis not present

## 2017-05-25 LAB — PROTIME-INR
INR: 1.08
Prothrombin Time: 13.9 seconds (ref 11.4–15.2)

## 2017-05-25 LAB — BASIC METABOLIC PANEL
Anion gap: 14 (ref 5–15)
BUN: 53 mg/dL — AB (ref 6–20)
CHLORIDE: 97 mmol/L — AB (ref 101–111)
CO2: 27 mmol/L (ref 22–32)
CREATININE: 10.63 mg/dL — AB (ref 0.61–1.24)
Calcium: 8.5 mg/dL — ABNORMAL LOW (ref 8.9–10.3)
GFR calc Af Amer: 5 mL/min — ABNORMAL LOW (ref >60)
GFR calc non Af Amer: 5 mL/min — ABNORMAL LOW (ref >60)
GLUCOSE: 85 mg/dL (ref 65–99)
Potassium: 5.2 mmol/L — ABNORMAL HIGH (ref 3.5–5.1)
SODIUM: 138 mmol/L (ref 135–145)

## 2017-05-25 LAB — CBC
HEMATOCRIT: 26.5 % — AB (ref 40.0–52.0)
Hemoglobin: 8.2 g/dL — ABNORMAL LOW (ref 13.0–18.0)
MCH: 26.7 pg (ref 26.0–34.0)
MCHC: 30.9 g/dL — AB (ref 32.0–36.0)
MCV: 86.5 fL (ref 80.0–100.0)
PLATELETS: 237 10*3/uL (ref 150–440)
RBC: 3.07 MIL/uL — ABNORMAL LOW (ref 4.40–5.90)
RDW: 19 % — AB (ref 11.5–14.5)
WBC: 5.7 10*3/uL (ref 3.8–10.6)

## 2017-05-25 SURGERY — A/V FISTULAGRAM
Anesthesia: Moderate Sedation

## 2017-05-25 MED ORDER — ALBUMIN HUMAN 25 % IV SOLN
25.0000 g | Freq: Once | INTRAVENOUS | Status: AC
  Administered 2017-05-25: 25 g via INTRAVENOUS
  Filled 2017-05-25: qty 100

## 2017-05-25 NOTE — Progress Notes (Signed)
[facility]  ROUNDING NOTE   Subjective:   Paracentesis this morning. 9 Liters removed.   Hemodialysis treatment yesterday. UF 3 liters.   Objective:  Vital signs in last 24 hours:  Temp:  [98 F (36.7 C)-98.7 F (37.1 C)] 98.5 F (36.9 C) (03/14 0730) Pulse Rate:  [74-87] 74 (03/14 1050) Resp:  [11-22] 16 (03/14 1050) BP: (114-157)/(60-100) 114/75 (03/14 1050) SpO2:  [96 %-100 %] 97 % (03/14 1050) Weight:  [75.3 kg (166 lb 1.6 oz)] 75.3 kg (166 lb 1.6 oz) (03/13 1843)  Weight change:  Filed Weights   05/24/17 1141 05/24/17 1843  Weight: 70.3 kg (155 lb) 75.3 kg (166 lb 1.6 oz)    Intake/Output: I/O last 3 completed shifts: In: 122.7 [I.V.:122.7] Out: 3004 [UMPNT:6144]   Intake/Output this shift:  No intake/output data recorded.  Physical Exam: General: NAD,   Head: Normocephalic, atraumatic. Moist oral mucosal membranes  Eyes: Anicteric, PERRL  Neck: Supple, trachea midline  Lungs:  Clear to auscultation  Heart: Regular rate and rhythm  Abdomen:  Soft, nontender,   Extremities:  no peripheral edema.  Neurologic: Nonfocal, moving all four extremities  Skin: No lesions  Access: AVF     Basic Metabolic Panel: Recent Labs  Lab 05/19/17 1313 05/24/17 1325 05/25/17 0501  NA  --  136 138  K  --  6.7* 5.2*  CL  --  94* 97*  CO2  --  28 27  GLUCOSE  --  101* 85  BUN  --  69* 53*  CREATININE  --  13.60* 10.63*  CALCIUM  --  8.7* 8.5*  PHOS 10.7*  --   --     Liver Function Tests: No results for input(s): AST, ALT, ALKPHOS, BILITOT, PROT, ALBUMIN in the last 168 hours. No results for input(s): LIPASE, AMYLASE in the last 168 hours. No results for input(s): AMMONIA in the last 168 hours.  CBC: Recent Labs  Lab 05/24/17 1325 05/25/17 0501  WBC 8.9 5.7  NEUTROABS 7.0*  --   HGB 8.0* 8.2*  HCT 25.5* 26.5*  MCV 85.8 86.5  PLT 287 237    Cardiac Enzymes: No results for input(s): CKTOTAL, CKMB, CKMBINDEX, TROPONINI in the last 168  hours.  BNP: Invalid input(s): POCBNP  CBG: No results for input(s): GLUCAP in the last 168 hours.  Microbiology: Results for orders placed or performed during the hospital encounter of 05/03/17  MRSA PCR Screening     Status: None   Collection Time: 05/03/17  1:35 PM  Result Value Ref Range Status   MRSA by PCR NEGATIVE NEGATIVE Final    Comment:        The GeneXpert MRSA Assay (FDA approved for NASAL specimens only), is one component of a comprehensive MRSA colonization surveillance program. It is not intended to diagnose MRSA infection nor to guide or monitor treatment for MRSA infections. Performed at [facility], [address] [place], [place] [address]     Coagulation Studies: Recent Labs    05/25/17 0501  LABPROT 13.9  INR 1.08    Urinalysis: No results for input(s): COLORURINE, LABSPEC, PHURINE, GLUCOSEU, HGBUR, BILIRUBINUR, KETONESUR, PROTEINUR, UROBILINOGEN, NITRITE, LEUKOCYTESUR in the last 72 hours.  Invalid input(s): APPERANCEUR    Imaging: US Paracentesis  Result Date: 05/25/2017 INDICATION: Ascites EXAM: ULTRASOUND GUIDED  PARACENTESIS MEDICATIONS: None. COMPLICATIONS: None immediate. PROCEDURE: Informed written consent was obtained from the patient after a discussion of the risks, benefits and alternatives to treatment. A timeout was performed prior  to the initiation of the procedure. Initial ultrasound scanning demonstrates a large amount of ascites within the right lower abdominal quadrant. The right lower abdomen was prepped and draped in the usual sterile fashion. 1% lidocaine with epinephrine was used for local anesthesia. Following this, a 6 Fr Safe-T-Centesis catheter was introduced. An ultrasound image was saved for documentation purposes. The paracentesis was performed. The catheter was removed and a dressing was applied. The patient tolerated the procedure well without immediate post procedural complication. FINDINGS: A total of  approximately 9 L of clear yellow fluid was removed. IMPRESSION: Successful ultrasound-guided paracentesis yielding 9 liters of peritoneal fluid. Electronically Signed   By: Marybelle Killings M.D.   On: 05/25/2017 14:09     Medications:   . sodium chloride 20 mL/hr at 05/24/17 2052  . sodium chloride    .  ceFAZolin (ANCEF) IV     . atorvastatin  40 mg Oral Daily  . calcium acetate  2,001 mg Oral TID WC  . ferrous sulfate  325 mg Oral TID  . furosemide  80 mg Oral Daily  . gabapentin  300 mg Oral QHS  . heparin  5,000 Units Subcutaneous Q8H  . labetalol  100 mg Oral Daily  . mometasone-formoterol  2 puff Inhalation BID  . multivitamin  1 tablet Oral Daily  . pantoprazole  40 mg Oral QAC breakfast  . sodium chloride flush  3 mL Intravenous Q12H  . tiotropium  18 mcg Inhalation Daily   sodium chloride, acetaminophen **OR** acetaminophen, albuterol, diphenhydrAMINE, ipratropium-albuterol, nitroGLYCERIN, ondansetron **OR** ondansetron (ZOFRAN) IV, oxyCODONE-acetaminophen, senna-docusate, sodium chloride flush  Assessment/ Plan:  Stanley [name] is a 57 y.o. black male withend stage renal disease on hemodialysis secondary to Alport's syndrome, ascites, hypertension, anemia of chronic kidney disease, coronary artery disease, peripheral vascular disease, hyperlipidemia, gastrointestinal AVMs, pulmonary hypertension,   nooutpatientdialysis unit currently  1.  Hyperkalemia 2.  Anemia of chronic kidney disease and GI Bleed 3.  Secondary Hyperparathyroidism with hyperphosphatemia.  4.  Ascites 5.  End-stage renal disease  Paracentesis today yeilding 9 liters.  Hemodialysis yesterday.  - Fistulogram as per vascular surgery.    LOS: 0 Ryott Rafferty 3/14/20192:22 PM

## 2017-05-25 NOTE — Progress Notes (Signed)
Patient to [facility] for fistulogram with Dr Lucky Cowboy, informed patient of wait due to other patient currently in procedure. Around 1530, patient demanded that he have his procedure at this time. Explained that the same patient was having their procedure and that he was next, apologized for the wait. Patient stated that he would not wait for current patient to finish procedure, and demanded the he be returned to his room. Educated patient that without a working fistula, he may not be able to have successful dialysis treatments. Patient acknowledged and stated that his "fistula works fine". Informed Dr Lucky Cowboy of patient's demands, arranged for patient to return to assigned room as requested.

## 2017-05-25 NOTE — Progress Notes (Signed)
Patient upset about not being able to eat anything. MD paged to verify the procedures being done today. The plan is for patient to go down for paracentesis, dialysis, and fistulogram. Nephrology called to verify. Nephrology confirmed, patient does need to be NPO for fistulogram. Explained and educated patient on his plan for today and why he needs to be NPO. Will continue to monitor patient.

## 2017-05-25 NOTE — Care Management (Signed)
Patient placed in observation for ascites relating to his liver disease.  ESRD and currently coming to the ED for his outpatient HD.  He has not been allowed to receive his treatments in the outpatient clinics due to behavior issues.  He had paracentesis- 9 liters removed,HD and is questionable to have fistulogram during this stay

## 2017-05-25 NOTE — Care Management Obs Status (Signed)
[facility] NOTIFICATION   Patient Details  Name: Stanley [name] MRN: [id number] Date of Birth: 02-Nov-1960   Medicare Observation Status Notification Given:  Yes    Katrina Stack, RN 05/25/2017, 9:32 AM

## 2017-05-25 NOTE — Progress Notes (Signed)
Patient came back up from specials upset. Got report from the nurse that patient did not want to wait any longer for the procedure. He "demanded" to come back to the room. After patient came back to the room I tried to educate patient about the importance of the procedure. Patient refused to be educated and stated he was walking out. IV was taken out and tele monitor off. MD paged to notify.   Update: MD called back, notified that patient did not go through with the procedure. Per MD patient can sign AMA paper and leave since he did no go through the procedure that was scheduled. Patient signed paper and ambulated out.

## 2017-05-25 NOTE — Plan of Care (Signed)
  Education: Knowledge of disease and its progression will improve 05/25/2017 0413 - Not Progressing by Jeri Cos, RN   Physical Regulation: Complications related to the disease process, condition or treatment will be avoided or minimized 05/25/2017 0413 - Progressing by Jeri Cos, RN

## 2017-05-25 NOTE — Discharge Summary (Signed)
[facility] NAME: Stanley [name]    MR#:  [id number]  DATE OF BIRTH:  May 27, 1960  DATE OF ADMISSION:  05/24/2017 ADMITTING PHYSICIAN: Lisa Roca, MD  DATE OF DISCHARGE: 05/25/2017  PRIMARY CARE PHYSICIAN: [name], MD    ADMISSION DIAGNOSIS:  Hyperkalemia [E87.5] Other ascites [R18.8] ESRD (end stage renal disease) on dialysis ([place]) [N18.6, Z99.2] Ascites [R18.8]  DISCHARGE DIAGNOSIS:  Active Problems:   Hyperkalemia   Alcoholic hepatitis with ascites   SECONDARY DIAGNOSIS:   Past Medical History:  Diagnosis Date  . Alcohol abuse   . CHF (congestive heart failure) ([place])   . Cirrhosis ([place])   . Coronary artery disease 2009  . Diabetic peripheral neuropathy ([place])   . Drug abuse ([place])   . End stage renal disease on dialysis [facility]) NEPHROLOGIST-   DR [facility]  IN [place]   HEMODIALYSIS --   TUES/  THURS/  SAT  . GERD (gastroesophageal reflux disease)   . Hyperlipidemia   . Hypertension   . PAD (peripheral artery disease) ([place])   . Renal insufficiency    Per pt, 32 oz fluid restriction per day  . S/P triple vessel bypass 06/09/2016   2009ish  . Suicidal ideation    & HOMICIDAL IDEATION --  06-16-2013   ADMITTED TO BEHAVIOR HEALTH    HOSPITAL COURSE:   57 year old male with past medical history of liver cirrhosis, alcohol abuse, history of coronary artery disease, diabetes, diabetic peripheral neuropathy, end-stage renal disease on hemodialysis Tuesday Thursday Saturday, GERD, hypertension, hyperlipidemia, peripheral vascular disease who presented to the hospital due to abdominal distention, shortness of breath and noted to be volume overloaded.  1. Fluid overload with ascites-this was secondary to his underlying liver disease and also end-stage renal disease. -Patient underwent urgent hemodialysis on admission with fluid removal, also underwent ultrasound-guided paracentesis and had 9 L taken off. His  abdominal distention and shortness of breath is significantly improved and therefore he is being discharged home.  2. [name]-patient presented with volume overload, underwent urgent hemodialysis on admission. He normally gets dialysis on Tuesday Thursday Saturday but he did not want his dialysis on the day of discharge and will resume his session on Saturday as an outpatient.  3. Hyperkalemia-secondary to patient's underlying end-stage renal disease. -Patient received urgent hemodialysis and his potassium level has now normalized.  4. COPD-patient had no acute COPD exacerbation. He'll continue his Symbicort, Spiriva and DuoNeb's as needed.  5. Secondary hyperparathyroidism-patient will continue his PhosLo.  6. Essential hypertension-patient will continue his labetalol.  7. Hyperlipidemia-patient will continue his atorvastatin.  8. GERD-patient will continue his Protonix.  DISCHARGE CONDITIONS:   Stable  CONSULTS OBTAINED:  Treatment Team:  Lavonia Dana, MD  DRUG ALLERGIES:  No Known Allergies  DISCHARGE MEDICATIONS:   Allergies as of 05/25/2017   No Known Allergies     Medication List    STOP taking these medications   lactulose 10 GM/15ML solution Commonly known as:  CHRONULAC     TAKE these medications   albuterol 108 (90 Base) MCG/ACT inhaler Commonly known as:  PROVENTIL HFA;VENTOLIN HFA Inhale 2 puffs into the lungs every 6 (six) hours as needed for wheezing or shortness of breath.   alum & mag hydroxide-simeth 200-200-20 MG/5ML suspension Commonly known as:  MAALOX/MYLANTA Take 15 mLs every 4 (four) hours as needed by mouth for indigestion or heartburn.   atorvastatin 40 MG tablet Commonly known as:  LIPITOR  Take 1 tablet (40 mg total) by mouth daily.   budesonide-formoterol 160-4.5 MCG/ACT inhaler Commonly known as:  SYMBICORT Inhale 2 puffs into the lungs daily.   calcium acetate 667 MG capsule Commonly known as:   PHOSLO Take 2,001 mg by mouth 3 (three) times daily.   DIALYVITE SUPREME D 3 MG Tabs Take 1 tablet (3 mg total) by mouth daily.   ferrous sulfate 325 (65 FE) MG tablet Take 1 tablet by mouth 3 (three) times daily.   furosemide 80 MG tablet Commonly known as:  LASIX Take 1 tablet (80 mg total) daily by mouth.   gabapentin 300 MG capsule Commonly known as:  NEURONTIN Take 1 capsule by mouth daily as directed   ipratropium-albuterol 0.5-2.5 (3) MG/3ML Soln Commonly known as:  DUONEB Take 3 mLs by nebulization every 6 (six) hours as needed.   labetalol 100 MG tablet Commonly known as:  NORMODYNE Take 100 mg by mouth daily.   nitroGLYCERIN 0.4 MG SL tablet Commonly known as:  NITROSTAT Place 1 tablet (0.4 mg total) under the tongue every 5 (five) minutes as needed.   pantoprazole 40 MG tablet Commonly known as:  PROTONIX Take 1 tablet (40 mg total) by mouth daily.   Spacer/Aero Chamber Mouthpiece Misc 1 Units by Does not apply route every 4 (four) hours as needed (wheezing).   tiotropium 18 MCG inhalation capsule Commonly known as:  SPIRIVA HANDIHALER Place 1 capsule (18 mcg total) into inhaler and inhale daily.         DISCHARGE INSTRUCTIONS:   DIET:  Cardiac diet and Renal diet  DISCHARGE CONDITION:  Stable  ACTIVITY:  Activity as tolerated  OXYGEN:  Home Oxygen: No.   Oxygen Delivery: room air  DISCHARGE LOCATION:  home   If you experience worsening of your admission symptoms, develop shortness of breath, life threatening emergency, suicidal or homicidal thoughts you must seek medical attention immediately by calling 911 or calling your MD immediately  if symptoms less severe.  You Must read complete instructions/literature along with all the possible adverse reactions/side effects for all the Medicines you take and that have been prescribed to you. Take any new Medicines after you have completely understood and accpet all the possible adverse  reactions/side effects.   Please note  You were cared for by a hospitalist during your hospital stay. If you have any questions about your discharge medications or the care you received while you were in the hospital after you are discharged, you can call the unit and asked to speak with the hospitalist on call if the hospitalist that took care of you is not available. Once you are discharged, your primary care physician will handle any further medical issues. Please note that NO REFILLS for any discharge medications will be authorized once you are discharged, as it is imperative that you return to your primary care physician (or establish a relationship with a primary care physician if you do not have one) for your aftercare needs so that they can reassess your need for medications and monitor your lab values.     Today   Abdominal distention, shortness of breath improved. No longer hypoxic. Patient wants to go home.  VITAL SIGNS:  Blood pressure 112/63, pulse 71, temperature 98.1 F (36.7 C), temperature source Oral, resp. rate 14, height 6\' 3"  (1.905 m), weight 75.3 kg (166 lb 1.6 oz), SpO2 94 %.  I/O:    Intake/Output Summary (Last 24 hours) at 05/25/2017 1516 Last data filed at 05/25/2017  4081 Gross per 24 hour  Intake 122.67 ml  Output 3004 ml  Net -2881.33 ml    PHYSICAL EXAMINATION:   GENERAL:  57 y.o.-year-old patient lying in the bed with no acute distress.  EYES: Pupils equal, round, reactive to light and accommodation. No scleral icterus. Extraocular muscles intact.  HEENT: Head atraumatic, normocephalic. Oropharynx and nasopharynx clear.  NECK:  Supple, no jugular venous distention. No thyroid enlargement, no tenderness.  LUNGS: Normal breath sounds bilaterally, no wheezing, rales,rhonchi. No use of accessory muscles of respiration.  CARDIOVASCULAR: S1, S2 normal. No murmurs, rubs, or gallops.  ABDOMEN: Soft, non-tender, non-distended. Bowel sounds present. No  organomegaly or mass.  EXTREMITIES: No pedal edema, cyanosis, or clubbing.  NEUROLOGIC: Cranial nerves II through XII are intact. No focal motor or sensory defecits b/l.  PSYCHIATRIC: The patient is alert and oriented x 3.  SKIN: No obvious rash, lesion, or ulcer.   Left upper extremity AV fistula with good bruit and a good thrill.  DATA REVIEW:   CBC Recent Labs  Lab 05/25/17 0501  WBC 5.7  HGB 8.2*  HCT 26.5*  PLT 237    Chemistries  Recent Labs  Lab 05/25/17 0501  NA 138  K 5.2*  CL 97*  CO2 27  GLUCOSE 85  BUN 53*  CREATININE 10.63*  CALCIUM 8.5*    Cardiac Enzymes No results for input(s): TROPONINI in the last 168 hours.  Microbiology Results  Results for orders placed or performed during the hospital encounter of 05/03/17  MRSA PCR Screening     Status: None   Collection Time: 05/03/17  1:35 PM  Result Value Ref Range Status   MRSA by PCR NEGATIVE NEGATIVE Final    Comment:        The GeneXpert MRSA Assay (FDA approved for NASAL specimens only), is one component of a comprehensive MRSA colonization surveillance program. It is not intended to diagnose MRSA infection nor to guide or monitor treatment for MRSA infections. Performed at [facility], [address] [place], [place] [address]     RADIOLOGY:  US Paracentesis  Result Date: 05/25/2017 INDICATION: Ascites EXAM: ULTRASOUND GUIDED  PARACENTESIS MEDICATIONS: None. COMPLICATIONS: None immediate. PROCEDURE: Informed written consent was obtained from the patient after a discussion of the risks, benefits and alternatives to treatment. A timeout was performed prior to the initiation of the procedure. Initial ultrasound scanning demonstrates a large amount of ascites within the right lower abdominal quadrant. The right lower abdomen was prepped and draped in the usual sterile fashion. 1% lidocaine with epinephrine was used for local anesthesia. Following this, a 6 Fr Safe-T-Centesis catheter  was introduced. An ultrasound image was saved for documentation purposes. The paracentesis was performed. The catheter was removed and a dressing was applied. The patient tolerated the procedure well without immediate post procedural complication. FINDINGS: A total of approximately 9 L of clear yellow fluid was removed. IMPRESSION: Successful ultrasound-guided paracentesis yielding 9 liters of peritoneal fluid. Electronically Signed   By: Marybelle Killings M.D.   On: 05/25/2017 14:09      Management plans discussed with the patient, family and they are in agreement.  CODE STATUS:     Code Status Orders  (From admission, onward)        Start     Ordered   05/24/17 1831  Full code  Continuous     05/24/17 1830    Code Status History    Date Active Date Inactive Code Status Order ID Comments  User Context   05/03/2017 13:31 05/05/2017 18:06 Full Code [id number]  Bettey Costa, MD Inpatient    TOTAL TIME TAKING CARE OF THIS PATIENT: 40 minutes.    Henreitta Leber M.D on 05/25/2017 at 3:16 PM  Between 7am to 6pm - Pager - [phone]  After 6pm go to [email or web address] - Proofreader  [facility]  Office  [phone]  CC: Primary care physician; [name], MD

## 2017-05-25 NOTE — Procedures (Signed)
Paracentesis EBL 0 Comp 0 

## 2017-05-26 ENCOUNTER — Other Ambulatory Visit: Payer: Self-pay

## 2017-05-26 ENCOUNTER — Encounter: Payer: Self-pay | Admitting: Emergency Medicine

## 2017-05-26 ENCOUNTER — Emergency Department
Admission: EM | Admit: 2017-05-26 | Discharge: 2017-05-26 | Disposition: A | Discharge status: Home / Self Care | Payer: Medicare Other | Attending: Emergency Medicine | Admitting: Emergency Medicine

## 2017-05-26 DIAGNOSIS — E1122 Type 2 diabetes mellitus with diabetic chronic kidney disease: Secondary | ICD-10-CM | POA: Diagnosis not present

## 2017-05-26 DIAGNOSIS — I2510 Atherosclerotic heart disease of native coronary artery without angina pectoris: Secondary | ICD-10-CM | POA: Insufficient documentation

## 2017-05-26 DIAGNOSIS — J449 Chronic obstructive pulmonary disease, unspecified: Secondary | ICD-10-CM | POA: Insufficient documentation

## 2017-05-26 DIAGNOSIS — D631 Anemia in chronic kidney disease: Secondary | ICD-10-CM | POA: Diagnosis not present

## 2017-05-26 DIAGNOSIS — I509 Heart failure, unspecified: Secondary | ICD-10-CM | POA: Diagnosis not present

## 2017-05-26 DIAGNOSIS — Z992 Dependence on renal dialysis: Secondary | ICD-10-CM

## 2017-05-26 DIAGNOSIS — N2581 Secondary hyperparathyroidism of renal origin: Secondary | ICD-10-CM | POA: Diagnosis not present

## 2017-05-26 DIAGNOSIS — N186 End stage renal disease: Secondary | ICD-10-CM | POA: Insufficient documentation

## 2017-05-26 DIAGNOSIS — E875 Hyperkalemia: Secondary | ICD-10-CM | POA: Diagnosis not present

## 2017-05-26 DIAGNOSIS — Z79899 Other long term (current) drug therapy: Secondary | ICD-10-CM | POA: Diagnosis not present

## 2017-05-26 DIAGNOSIS — F17210 Nicotine dependence, cigarettes, uncomplicated: Secondary | ICD-10-CM | POA: Diagnosis not present

## 2017-05-26 DIAGNOSIS — Z4931 Encounter for adequacy testing for hemodialysis: Secondary | ICD-10-CM | POA: Diagnosis not present

## 2017-05-26 DIAGNOSIS — E1140 Type 2 diabetes mellitus with diabetic neuropathy, unspecified: Secondary | ICD-10-CM | POA: Insufficient documentation

## 2017-05-26 DIAGNOSIS — I132 Hypertensive heart and chronic kidney disease with heart failure and with stage 5 chronic kidney disease, or end stage renal disease: Secondary | ICD-10-CM | POA: Insufficient documentation

## 2017-05-26 LAB — HIV ANTIBODY (ROUTINE TESTING W REFLEX): HIV Screen 4th Generation wRfx: NONREACTIVE

## 2017-05-26 MED ORDER — EPOETIN ALFA 10000 UNIT/ML IJ SOLN
10000.0000 [IU] | Freq: Once | INTRAMUSCULAR | Status: AC
Start: 2017-05-26 — End: 2017-05-26
  Administered 2017-05-26: 10000 [IU] via INTRAVENOUS
  Filled 2017-05-26: qty 1

## 2017-05-26 MED ORDER — DIPHENHYDRAMINE HCL 50 MG/ML IJ SOLN
12.5000 mg | Freq: Once | INTRAMUSCULAR | Status: AC
  Administered 2017-05-26: 12.5 mg via INTRAVENOUS

## 2017-05-26 NOTE — ED Notes (Signed)
First Nurse Note:  Patient states he is here for dialysis.  Charge Nurse states patient can go to 5 Hallway bed after triage.

## 2017-05-26 NOTE — ED Notes (Signed)
Pt transported to Dialysis by [facility], RN.

## 2017-05-26 NOTE — Progress Notes (Signed)
Post HD assessment. Pt tolerated tx well, without c/o or complications. Net UF 3022, goal met.    05/26/17 1501  Vital Signs  Temp 98.6 F (37 C)  Temp Source Oral  Pulse Rate 87  Pulse Rate Source Monitor  Resp 16  BP (!) 112/56  BP Location Left Arm  BP Method Automatic  Patient Position (if appropriate) Lying  Oxygen Therapy  SpO2 100 %  O2 Device Room Air  Dialysis Weight  Weight (unable to weigh pt, in chair, scale unavailable )  Type of Weight Post-Dialysis  Post-Hemodialysis Assessment  Rinseback Volume (mL) 250 mL  KECN 81.6 V  Dialyzer Clearance Lightly streaked  Duration of HD Treatment -hour(s) 3.5 hour(s)  Hemodialysis Intake (mL) 500 mL  UF Total -Machine (mL) 3522 mL  Net UF (mL) 3022 mL  Tolerated HD Treatment Yes  AVG/AVF Arterial Site Held (minutes) 10 minutes  AVG/AVF Venous Site Held (minutes) 10 minutes  Education / Care Plan  Dialysis Education Provided Yes  Documented Education in Care Plan Yes  Fistula / Graft Right Upper arm Arteriovenous fistula  No Placement Date or Time found.   Orientation: Right  Access Location: Upper arm  Access Type: Arteriovenous fistula  Site Condition No complications  Fistula / Graft Assessment Present;Thrill;Bruit  Status Deaccessed  Drainage Description None

## 2017-05-26 NOTE — Progress Notes (Signed)
[facility]  ROUNDING NOTE   Subjective:   Seen and examined on hemodialysis.   Did not get fistulogram yesterday. Left AMA  Objective:  Vital signs in last 24 hours:  Temp:  [98.2 F (36.8 C)-98.6 F (37 C)] 98.6 F (37 C) (03/15 1501) Pulse Rate:  [70-87] 87 (03/15 1501) Resp:  [13-20] 16 (03/15 1501) BP: (112-131)/(56-87) 112/56 (03/15 1501) SpO2:  [96 %-100 %] 100 % (03/15 1501) Weight:  [75.3 kg (166 lb)] 75.3 kg (166 lb) (03/15 0900)  Weight change:  Filed Weights   05/26/17 0900  Weight: 75.3 kg (166 lb)    Intake/Output: No intake/output data recorded.   Intake/Output this shift:  Total I/O In: -  Out: 3022 [Other:3022]  Physical Exam: General: NAD,   Head: Normocephalic, atraumatic. Moist oral mucosal membranes  Eyes: Anicteric, PERRL  Neck: Supple, trachea midline  Lungs:  Clear to auscultation  Heart: Regular rate and rhythm  Abdomen:  Soft, nontender,   Extremities:  no peripheral edema.  Neurologic: Nonfocal, moving all four extremities  Skin: No lesions  Access: AVF     Basic Metabolic Panel: Recent Labs  Lab 05/24/17 1325 05/25/17 0501  NA 136 138  K 6.7* 5.2*  CL 94* 97*  CO2 28 27  GLUCOSE 101* 85  BUN 69* 53*  CREATININE 13.60* 10.63*  CALCIUM 8.7* 8.5*    Liver Function Tests: No results for input(s): AST, ALT, ALKPHOS, BILITOT, PROT, ALBUMIN in the last 168 hours. No results for input(s): LIPASE, AMYLASE in the last 168 hours. No results for input(s): AMMONIA in the last 168 hours.  CBC: Recent Labs  Lab 05/24/17 1325 05/25/17 0501  WBC 8.9 5.7  NEUTROABS 7.0*  --   HGB 8.0* 8.2*  HCT 25.5* 26.5*  MCV 85.8 86.5  PLT 287 237    Cardiac Enzymes: No results for input(s): CKTOTAL, CKMB, CKMBINDEX, TROPONINI in the last 168 hours.  BNP: Invalid input(s): POCBNP  CBG: No results for input(s): GLUCAP in the last 168 hours.  Microbiology: Results for orders placed or performed during the hospital  encounter of 05/03/17  MRSA PCR Screening     Status: None   Collection Time: 05/03/17  1:35 PM  Result Value Ref Range Status   MRSA by PCR NEGATIVE NEGATIVE Final    Comment:        The GeneXpert MRSA Assay (FDA approved for NASAL specimens only), is one component of a comprehensive MRSA colonization surveillance program. It is not intended to diagnose MRSA infection nor to guide or monitor treatment for MRSA infections. Performed at [facility], [address] [place], [place] [address]     Coagulation Studies: Recent Labs    05/25/17 0501  LABPROT 13.9  INR 1.08    Urinalysis: No results for input(s): COLORURINE, LABSPEC, PHURINE, GLUCOSEU, HGBUR, BILIRUBINUR, KETONESUR, PROTEINUR, UROBILINOGEN, NITRITE, LEUKOCYTESUR in the last 72 hours.  Invalid input(s): APPERANCEUR    Imaging: US Paracentesis  Result Date: 05/25/2017 INDICATION: Ascites EXAM: ULTRASOUND GUIDED  PARACENTESIS MEDICATIONS: None. COMPLICATIONS: None immediate. PROCEDURE: Informed written consent was obtained from the patient after a discussion of the risks, benefits and alternatives to treatment. A timeout was performed prior to the initiation of the procedure. Initial ultrasound scanning demonstrates a large amount of ascites within the right lower abdominal quadrant. The right lower abdomen was prepped and draped in the usual sterile fashion. 1% lidocaine with epinephrine was used for local anesthesia. Following this, a 6 Fr Safe-T-Centesis catheter was  introduced. An ultrasound image was saved for documentation purposes. The paracentesis was performed. The catheter was removed and a dressing was applied. The patient tolerated the procedure well without immediate post procedural complication. FINDINGS: A total of approximately 9 L of clear yellow fluid was removed. IMPRESSION: Successful ultrasound-guided paracentesis yielding 9 liters of peritoneal fluid. Electronically Signed   By: Marybelle Killings  M.D.   On: 05/25/2017 14:09     Medications:       Assessment/ Plan:  [name] is a 57 y.o. black male withend stage renal disease on hemodialysis secondary to Alport's syndrome, ascites, hypertension, anemia of chronic kidney disease, coronary artery disease, peripheral vascular disease, hyperlipidemia, gastrointestinal AVMs, pulmonary hypertension,   nooutpatientdialysis unit currently  1.  Hyperkalemia 2.  Anemia of chronic kidney disease and GI Bleed 3.  Secondary Hyperparathyroidism with hyperphosphatemia.  4.  Ascites 5.  End-stage renal disease  Hemodialysis on a Monday, Wednesday Friday schedule - Fistulogram as per vascular surgery.    LOS: 0 Stanley [name] 3/15/20195:05 PM

## 2017-05-26 NOTE — ED Provider Notes (Signed)
[facility] Emergency Department Provider Note  ____________________________________________   I have reviewed the triage vital signs and the nursing notes. Where available I have reviewed prior notes and, if possible and indicated, outside hospital notes.    HISTORY  Chief Complaint Here for dialysis    HPI Stanley [name] is a 57 y.o. male  Who unfortunately has to use the emergency room for dialysis.He has no complaints he is here for dialysis.  No shortness of breath, denies missing dialysis recently, no fever No leg swelling etc.    Past Medical History:  Diagnosis Date  . Alcohol abuse   . CHF (congestive heart failure) ([place])   . Cirrhosis ([place])   . Coronary artery disease 2009  . Diabetic peripheral neuropathy ([place])   . Drug abuse ([place])   . End stage renal disease on dialysis [facility]) NEPHROLOGIST-   DR [facility]  IN [place]   HEMODIALYSIS --   TUES/  THURS/  SAT  . GERD (gastroesophageal reflux disease)   . Hyperlipidemia   . Hypertension   . PAD (peripheral artery disease) ([place])   . Renal insufficiency    Per pt, 32 oz fluid restriction per day  . S/P triple vessel bypass 06/09/2016   2009ish  . Suicidal ideation    & HOMICIDAL IDEATION --  06-16-2013   ADMITTED TO BEHAVIOR HEALTH    Patient Active Problem List   Diagnosis Date Noted  . Alcoholic hepatitis with ascites 05/24/2017  . ESRD (end stage renal disease) ([place]) 04/28/2017  . Uremia 03/08/2017  . Hemodialysis patient ([place]) 03/03/2017  . Weakness 02/28/2017  . Hypocalcemia 02/22/2017  . Shortness of breath 11/26/2016  . COPD (chronic obstructive pulmonary disease) ([place]) 10/30/2016  . COPD exacerbation ([place]) 10/29/2016  . Anemia   . Heme positive stool   . Ulceration of intestine   . Benign neoplasm of transverse colon   . Acute gastrointestinal hemorrhage   . Esophageal candidiasis ([place])   . Angiodysplasia of intestinal tract   . Acute respiratory failure with hypoxia  ([place]) 07/03/2016  . GI bleeding 06/24/2016  . Rectal bleeding 06/14/2016  . Anemia of chronic disease 06/01/2016  . MRSA carrier 06/01/2016  . Chronic renal failure 05/23/2016  . Ischemic heart disease 05/23/2016  . Angiodysplasia of small intestine   . Melena   . Small bowel bleed not requiring more than 4 units of blood in 24 hours, ICU, or surgery   . Anemia due to chronic blood loss   . Abdominal pain 05/05/2016  . Acute posthemorrhagic anemia 04/17/2016  . Gastrointestinal bleed 04/17/2016  . History of esophagogastroduodenoscopy (EGD) 04/17/2016  . Elevated troponin 04/17/2016  . Alcohol abuse 04/17/2016  . Upper GI bleed 01/19/2016  . Blood in stool   . Angiodysplasia of stomach and duodenum with hemorrhage   . Gastritis   . Esophagitis, unspecified   . GI bleed 05/16/2015  . Acute GI bleeding   . Symptomatic anemia 04/30/2015  . HTN (hypertension) 04/06/2015  . GERD (gastroesophageal reflux disease) 04/06/2015  . HLD (hyperlipidemia) 04/06/2015  . Dyspnea 04/06/2015  . Cirrhosis of liver with ascites ([place]) 04/06/2015  . Ascites 04/06/2015  . GIB (gastrointestinal bleeding) 03/23/2015  . Homicidal ideation 06/19/2013  . Suicidal intent 06/19/2013  . Homicidal ideations 06/19/2013  . Hyperkalemia 06/16/2013  . Mandible fracture ([place]) 06/05/2013  . Fracture, mandible ([place]) 06/02/2013  . Coronary atherosclerosis of native coronary artery 06/02/2013  . ESRD on dialysis ([place]) 06/02/2013  . Mandible open  fracture ([place]) 06/02/2013    Past Surgical History:  Procedure Laterality Date  . AGILE CAPSULE N/A 06/19/2016   Procedure: AGILE CAPSULE;  Surgeon: [name], MD;  Location: [facility] ENDOSCOPY;  Service: Endoscopy;  Laterality: N/A;  . COLONOSCOPY WITH PROPOFOL N/A 06/18/2016   Procedure: COLONOSCOPY WITH PROPOFOL;  Surgeon: [name], MD;  Location: [facility] ENDOSCOPY;  Service: Endoscopy;  Laterality: N/A;  . COLONOSCOPY WITH PROPOFOL N/A 08/12/2016   Procedure: COLONOSCOPY  WITH PROPOFOL;  Surgeon: [name], MD;  Location: [facility] ENDOSCOPY;  Service: Endoscopy;  Laterality: N/A;  . COLONOSCOPY WITH PROPOFOL N/A 05/05/2017   Procedure: COLONOSCOPY WITH PROPOFOL;  Surgeon: Manya Silvas, MD;  Location: [facility] ENDOSCOPY;  Service: Endoscopy;  Laterality: N/A;  . CORONARY ANGIOPLASTY  ?   PT UNABLE TO TELL IF  BEFORE OR AFTER  CABG  . CORONARY ARTERY BYPASS GRAFT  2008  ([place] , [place])   3 VESSEL  . DIALYSIS FISTULA CREATION  LAST SURGERY  APPOX  2008  . ENTEROSCOPY N/A 05/10/2016   Procedure: ENTEROSCOPY;  Surgeon: Jerene Bears, MD;  Location: [place];  Service: Gastroenterology;  Laterality: N/A;  . ENTEROSCOPY N/A 08/12/2016   Procedure: ENTEROSCOPY;  Surgeon: [name], MD;  Location: [facility] ENDOSCOPY;  Service: Endoscopy;  Laterality: N/A;  . ESOPHAGOGASTRODUODENOSCOPY N/A 05/07/2015   Procedure: ESOPHAGOGASTRODUODENOSCOPY (EGD);  Surgeon: Hulen Luster, MD;  Location: [facility] ENDOSCOPY;  Service: Endoscopy;  Laterality: N/A;  . ESOPHAGOGASTRODUODENOSCOPY (EGD) WITH PROPOFOL N/A 05/17/2015   Procedure: ESOPHAGOGASTRODUODENOSCOPY (EGD) WITH PROPOFOL;  Surgeon: [name], MD;  Location: [facility] ENDOSCOPY;  Service: Endoscopy;  Laterality: N/A;  . ESOPHAGOGASTRODUODENOSCOPY (EGD) WITH PROPOFOL N/A 01/20/2016   Procedure: ESOPHAGOGASTRODUODENOSCOPY (EGD) WITH PROPOFOL;  Surgeon: [name], MD;  Location: [facility] ENDOSCOPY;  Service: Endoscopy;  Laterality: N/A;  . ESOPHAGOGASTRODUODENOSCOPY (EGD) WITH PROPOFOL N/A 04/17/2016   Procedure: ESOPHAGOGASTRODUODENOSCOPY (EGD) WITH PROPOFOL;  Surgeon: Lin Landsman, MD;  Location: [facility] ENDOSCOPY;  Service: Gastroenterology;  Laterality: N/A;  . ESOPHAGOGASTRODUODENOSCOPY (EGD) WITH PROPOFOL  05/09/2016   Procedure: ESOPHAGOGASTRODUODENOSCOPY (EGD) WITH PROPOFOL;  Surgeon: Jerene Bears, MD;  Location: [place];  Service: Endoscopy;;  . ESOPHAGOGASTRODUODENOSCOPY (EGD) WITH PROPOFOL N/A 06/16/2016   Procedure:  ESOPHAGOGASTRODUODENOSCOPY (EGD) WITH PROPOFOL;  Surgeon: [name], MD;  Location: [facility] ENDOSCOPY;  Service: Endoscopy;  Laterality: N/A;  . ESOPHAGOGASTRODUODENOSCOPY (EGD) WITH PROPOFOL N/A 05/05/2017   Procedure: ESOPHAGOGASTRODUODENOSCOPY (EGD) WITH PROPOFOL;  Surgeon: Manya Silvas, MD;  Location: [facility] ENDOSCOPY;  Service: Endoscopy;  Laterality: N/A;  . GIVENS CAPSULE STUDY N/A 05/07/2016   Procedure: GIVENS CAPSULE STUDY;  Surgeon: Doran Stabler, MD;  Location: [place];  Service: Endoscopy;  Laterality: N/A;  . MANDIBULAR HARDWARE REMOVAL N/A 07/29/2013   Procedure: REMOVAL OF ARCH BARS;  Surgeon: Theodoro Kos, DO;  Location: [place];  Service: Plastics;  Laterality: N/A;  . ORIF MANDIBULAR FRACTURE N/A 06/05/2013   Procedure: REPAIR OF MANDIBULAR FRACTURE x 2 with maxillo-mandibular fixation ;  Surgeon: Theodoro Kos, DO;  Location: [place];  Service: Plastics;  Laterality: N/A;  . PERIPHERAL ARTERIAL STENT GRAFT Left     Prior to Admission medications   Medication Sig Start Date End Date Taking? Authorizing Provider  albuterol (PROVENTIL HFA;VENTOLIN HFA) 108 (90 Base) MCG/ACT inhaler Inhale 2 puffs into the lungs every 6 (six) hours as needed for wheezing or shortness of breath. 04/21/17   Lisa Roca, MD  alum & mag hydroxide-simeth (MAALOX/MYLANTA) 200-200-20 MG/5ML suspension Take 15 mLs every 4 (four) hours  as needed by mouth for indigestion or heartburn. Patient not taking: Reported on 04/19/2017 01/25/17   Demetrios Loll, MD  atorvastatin (LIPITOR) 40 MG tablet Take 1 tablet (40 mg total) by mouth daily. 11/16/16   Vaughan Basta, MD  budesonide-formoterol (SYMBICORT) 160-4.5 MCG/ACT inhaler Inhale 2 puffs into the lungs daily. 11/16/16   Vaughan Basta, MD  calcium acetate (PHOSLO) 667 MG capsule Take 2,001 mg by mouth 3 (three) times daily.    [provider]  ferrous sulfate 325 (65 FE) MG tablet Take 1 tablet by mouth 3 (three)  times daily. 01/21/17   [provider]  furosemide (LASIX) 80 MG tablet Take 1 tablet (80 mg total) daily by mouth. 01/25/17   Demetrios Loll, MD  gabapentin (NEURONTIN) 300 MG capsule Take 1 capsule by mouth daily as directed 08/16/16   [provider]  ipratropium-albuterol (DUONEB) 0.5-2.5 (3) MG/3ML SOLN Take 3 mLs by nebulization every 6 (six) hours as needed. 11/28/16   Loletha Grayer, MD  labetalol (NORMODYNE) 100 MG tablet Take 100 mg by mouth daily. 03/24/17   [provider]  Multiple Vitamins-Minerals-FA (DIALYVITE SUPREME D) 3 MG TABS Take 1 tablet (3 mg total) by mouth daily. 11/28/16   Loletha Grayer, MD  nitroGLYCERIN (NITROSTAT) 0.4 MG SL tablet Place 1 tablet (0.4 mg total) under the tongue every 5 (five) minutes as needed. 04/12/16   Wende Bushy, MD  pantoprazole (PROTONIX) 40 MG tablet Take 1 tablet (40 mg total) by mouth daily. 11/28/16   Loletha Grayer, MD  Spacer/Aero Chamber Mouthpiece MISC 1 Units by Does not apply route every 4 (four) hours as needed (wheezing). 02/19/17   Darel Hong, MD  tiotropium (SPIRIVA HANDIHALER) 18 MCG inhalation capsule Place 1 capsule (18 mcg total) into inhaler and inhale daily. 11/16/16 11/16/17  Vaughan Basta, MD    Allergies Patient has no known allergies.  Family History  Problem Relation Age of Onset  . Colon cancer Mother   . Cancer Father   . Cancer Sister   . Kidney disease Brother     Social History Social History   Tobacco Use  . Smoking status: Current Every Day Smoker    Packs/day: 0.15    Years: 40.00    Pack years: 6.00    Types: Cigarettes  . Smokeless tobacco: Never Used  Substance Use Topics  . Alcohol use: No    Comment: pt reports quitting after learning about cirrhosis  . Drug use: No    Review of Systems Constitutional: No fever/chills Eyes: No visual changes. ENT: No sore throat. No stiff neck no neck pain Cardiovascular: Denies chest pain. Respiratory: Denies  shortness of breath. Gastrointestinal:   no vomiting.  No diarrhea.  No constipation. Genitourinary: Negative for dysuria. Musculoskeletal: Negative lower extremity swelling Skin: Negative for rash. Neurological: Negative for severe headaches, focal weakness or numbness.   ____________________________________________   PHYSICAL EXAM:  VITAL SIGNS: ED Triage Vitals  Enc Vitals Group     BP 05/26/17 0859 113/69     Pulse Rate 05/26/17 0859 82     Resp 05/26/17 0859 20     Temp 05/26/17 0859 98.6 F (37 C)     Temp Source 05/26/17 0859 Oral     SpO2 05/26/17 0859 99 %     Weight 05/26/17 0900 166 lb (75.3 kg)     Height 05/26/17 0900 6\' 3"  (1.905 m)     Head Circumference --      Peak Flow --  Pain Score --      Pain Loc --      Pain Edu? --      Excl. in [place]? --     Constitutional: Alert and oriented. Well appearing and in no acute distress. Eyes: Conjunctivae are normal Head: Atraumatic HEENT: No congestion/rhinnorhea. Mucous membranes are moist.  Oropharynx non-erythematous Cardiovascular: Normal rate, regular rhythm. Grossly normal heart sounds.  Good peripheral circulation. Respiratory: Normal respiratory effort.  No retractions. Lungs CTAB. Abdominal: Soft and nontender. No distention. No guarding no rebound Musculoskeletal: s no edema Neurologic:  Normal speech and language. No gross focal neurologic deficits are appreciated.  Skin:  Skin is warm, dry and intact. No rash noted. Psychiatric: Mood and affect are normal. Speech and behavior are normal.  ____________________________________________   LABS (all labs ordered are listed, but only abnormal results are displayed)  Labs Reviewed - No data to display  Pertinent labs  results that were available during my care of the patient were reviewed by me and considered in my medical decision making (see chart for details). ____________________________________________  EKG  I personally interpreted any EKGs  ordered by me or triage  ____________________________________________  RADIOLOGY  Pertinent labs & imaging results that were available during my care of the patient were reviewed by me and considered in my medical decision making (see chart for details). If possible, patient and/or family made aware of any abnormal findings.  US Paracentesis  Result Date: 05/25/2017 INDICATION: Ascites EXAM: ULTRASOUND GUIDED  PARACENTESIS MEDICATIONS: None. COMPLICATIONS: None immediate. PROCEDURE: Informed written consent was obtained from the patient after a discussion of the risks, benefits and alternatives to treatment. A timeout was performed prior to the initiation of the procedure. Initial ultrasound scanning demonstrates a large amount of ascites within the right lower abdominal quadrant. The right lower abdomen was prepped and draped in the usual sterile fashion. 1% lidocaine with epinephrine was used for local anesthesia. Following this, a 6 Fr Safe-T-Centesis catheter was introduced. An ultrasound image was saved for documentation purposes. The paracentesis was performed. The catheter was removed and a dressing was applied. The patient tolerated the procedure well without immediate post procedural complication. FINDINGS: A total of approximately 9 L of clear yellow fluid was removed. IMPRESSION: Successful ultrasound-guided paracentesis yielding 9 liters of peritoneal fluid. Electronically Signed   By: Marybelle Killings M.D.   On: 05/25/2017 14:09   ____________________________________________    PROCEDURES  Procedure(s) performed: None  Procedures  Critical Care performed: None  ____________________________________________   INITIAL IMPRESSION / ASSESSMENT AND PLAN / ED COURSE  Pertinent labs & imaging results that were available during my care of the patient were reviewed by me and considered in my medical decision making (see chart for details).  Patient here for dialysis, no other  complaints.    ____________________________________________   FINAL CLINICAL IMPRESSION(S) / ED DIAGNOSES  Final diagnoses:  None      This chart was dictated using voice recognition software.  Despite best efforts to proofread,  errors can occur which can change meaning.     Schuyler Amor, MD 05/26/17 [phone]

## 2017-05-26 NOTE — ED Notes (Signed)
This RN called by first nurse and notified that patient had left dialysis without waiting for staff to come get him. This RN intercepted patient in the lobby, attempted to get patient to com back to hall bed and get vitals, D/C paperwork and sign D/C instructions. Pt refused. Pt visualized in NAD, ambulatory from the lobby to [employer] transportation. Nira Conn, Agricultural consultant made aware.

## 2017-05-26 NOTE — Progress Notes (Signed)
HD STARTED  

## 2017-05-26 NOTE — ED Triage Notes (Signed)
Here for dialysis 

## 2017-05-26 NOTE — Progress Notes (Signed)
Post HD assessment    05/26/17 1504  Neurological  Level of Consciousness Alert  Orientation Level Oriented X4  Respiratory  Respiratory Pattern Regular;Unlabored  Chest Assessment Chest expansion symmetrical  Vascular  R Radial Pulse +2  L Radial Pulse +2  Integumentary  Integumentary (WDL) X  Skin Color Appropriate for ethnicity  Musculoskeletal  Musculoskeletal (WDL) X  Generalized Weakness Yes  Assistive Device None  GU Assessment  Genitourinary (WDL) X  Genitourinary Symptoms (HD)  Psychosocial  Psychosocial (WDL) WDL

## 2017-05-26 NOTE — ED Notes (Signed)
NAD noted at time of D/C. Pt denies questions or concerns. Pt ambulatory to the lobby at this time. Pt refused D/C vitals and to sign D/C paperwork and to be reevaluated by MD post-dialysis.

## 2017-05-26 NOTE — Progress Notes (Signed)
HD tx end   05/26/17 1450  Vital Signs  Pulse Rate 83  Pulse Rate Source Monitor  Resp 13  BP 112/60  BP Location Left Arm  BP Method Automatic  Patient Position (if appropriate) Lying  Oxygen Therapy  SpO2 100 %  O2 Device Room Air  During Hemodialysis Assessment  Dialysis Fluid Bolus Normal Saline  Bolus Amount (mL) 250 mL  Intra-Hemodialysis Comments Tx completed

## 2017-05-29 ENCOUNTER — Other Ambulatory Visit: Payer: Self-pay

## 2017-05-29 ENCOUNTER — Emergency Department
Admission: EM | Admit: 2017-05-29 | Discharge: 2017-05-29 | Discharge status: Against Medical Advice | Payer: Medicare Other | Attending: Emergency Medicine | Admitting: Emergency Medicine

## 2017-05-29 DIAGNOSIS — N186 End stage renal disease: Secondary | ICD-10-CM | POA: Diagnosis not present

## 2017-05-29 DIAGNOSIS — Q8781 Alport syndrome: Secondary | ICD-10-CM | POA: Insufficient documentation

## 2017-05-29 DIAGNOSIS — Z992 Dependence on renal dialysis: Secondary | ICD-10-CM | POA: Diagnosis not present

## 2017-05-29 DIAGNOSIS — R188 Other ascites: Secondary | ICD-10-CM | POA: Diagnosis not present

## 2017-05-29 DIAGNOSIS — D631 Anemia in chronic kidney disease: Secondary | ICD-10-CM | POA: Diagnosis not present

## 2017-05-29 DIAGNOSIS — N2581 Secondary hyperparathyroidism of renal origin: Secondary | ICD-10-CM | POA: Diagnosis not present

## 2017-05-29 DIAGNOSIS — E875 Hyperkalemia: Secondary | ICD-10-CM | POA: Diagnosis not present

## 2017-05-29 LAB — RENAL FUNCTION PANEL
ALBUMIN: 2.4 g/dL — AB (ref 3.5–5.0)
ANION GAP: 11 (ref 5–15)
BUN: 69 mg/dL — AB (ref 6–20)
CO2: 23 mmol/L (ref 22–32)
Calcium: 8.3 mg/dL — ABNORMAL LOW (ref 8.9–10.3)
Chloride: 98 mmol/L — ABNORMAL LOW (ref 101–111)
Creatinine, Ser: 9.56 mg/dL — ABNORMAL HIGH (ref 0.61–1.24)
GFR, EST AFRICAN AMERICAN: 6 mL/min — AB (ref >60)
GFR, EST NON AFRICAN AMERICAN: 5 mL/min — AB (ref >60)
Glucose, Bld: 84 mg/dL (ref 65–99)
PHOSPHORUS: 3.1 mg/dL (ref 2.5–4.6)
POTASSIUM: 6.1 mmol/L — AB (ref 3.5–5.1)
Sodium: 132 mmol/L — ABNORMAL LOW (ref 135–145)

## 2017-05-29 NOTE — Progress Notes (Signed)
Post HD assessment  

## 2017-05-29 NOTE — Progress Notes (Signed)
HD tx end   05/29/17 1600  Vital Signs  Pulse Rate 96  Pulse Rate Source Monitor  Resp 18  BP 113/68  BP Location Left Arm  BP Method Automatic  Patient Position (if appropriate) Sitting  Oxygen Therapy  SpO2 (!) 74 %  O2 Device Room Air  During Hemodialysis Assessment  Dialysis Fluid Bolus Normal Saline  Bolus Amount (mL) 250 mL  Intra-Hemodialysis Comments Tx completed

## 2017-05-29 NOTE — Progress Notes (Signed)
Post HD assessment. Pt tolerated tx well, without c/o or complications. Net UF 3043, goal met.    05/29/17 1605  Vital Signs  Temp 98.6 F (37 C)  Temp Source Oral  Pulse Rate 84  Pulse Rate Source Monitor  Resp 15  BP (!) 116/59  BP Location Left Arm  BP Method Automatic  Patient Position (if appropriate) Sitting  Oxygen Therapy  SpO2 100 %  O2 Device Room Air  Dialysis Weight  Weight (unable to weigh pt, scale unavailable )  Type of Weight Post-Dialysis  Post-Hemodialysis Assessment  Rinseback Volume (mL) 250 mL  KECN 75 V  Dialyzer Clearance Lightly streaked  Duration of HD Treatment -hour(s) 3.5 hour(s)  Hemodialysis Intake (mL) 500 mL  UF Total -Machine (mL) 3543 mL  Net UF (mL) 3043 mL  Tolerated HD Treatment Yes  AVG/AVF Arterial Site Held (minutes) 10 minutes  AVG/AVF Venous Site Held (minutes) 10 minutes  Education / Care Plan  Dialysis Education Provided Yes  Documented Education in Care Plan Yes  Fistula / Graft Right Upper arm Arteriovenous fistula  No Placement Date or Time found.   Orientation: Right  Access Location: Upper arm  Access Type: Arteriovenous fistula  Site Condition No complications  Fistula / Graft Assessment Present;Thrill;Bruit  Status Deaccessed  Drainage Description None

## 2017-05-29 NOTE — Progress Notes (Signed)
[facility]  ROUNDING NOTE   Subjective:   Patient presents to the emergency room for evaluation of dialysis Denies acute shortness of breath Concerned about his AV fistula but left AMA before the procedure last week because he thought he had a problem with ride   Objective:  Vital signs in last 24 hours:  Temp:  [98.2 F (36.8 C)] 98.2 F (36.8 C) (03/18 1200) Pulse Rate:  [73-114] 96 (03/18 1600) Resp:  [11-24] 18 (03/18 1600) BP: (113-140)/(60-89) 113/68 (03/18 1600) SpO2:  [74 %-100 %] 74 % (03/18 1600) Weight:  [75.3 kg (166 lb)] 75.3 kg (166 lb) (03/18 1146)  Weight change:  Filed Weights   05/29/17 1146  Weight: 75.3 kg (166 lb)    Intake/Output: No intake/output data recorded.   Intake/Output this shift:  No intake/output data recorded.  Physical Exam: General: NAD,   Head: Normocephalic, atraumatic. Moist oral mucosal membranes  Eyes: Anicteric,  Neck: Supple,   Lungs:  Clear to auscultation  Heart: Regular rate and rhythm  Abdomen:  Soft, nontender, distended  Extremities:  no peripheral edema.  Neurologic: Nonfocal, moving all four extremities  Skin: No lesions  Access: RUE AVF     Basic Metabolic Panel: Recent Labs  Lab 05/24/17 1325 05/25/17 0501  NA 136 138  K 6.7* 5.2*  CL 94* 97*  CO2 28 27  GLUCOSE 101* 85  BUN 69* 53*  CREATININE 13.60* 10.63*  CALCIUM 8.7* 8.5*    Liver Function Tests: No results for input(s): AST, ALT, ALKPHOS, BILITOT, PROT, ALBUMIN in the last 168 hours. No results for input(s): LIPASE, AMYLASE in the last 168 hours. No results for input(s): AMMONIA in the last 168 hours.  CBC: Recent Labs  Lab 05/24/17 1325 05/25/17 0501  WBC 8.9 5.7  NEUTROABS 7.0*  --   HGB 8.0* 8.2*  HCT 25.5* 26.5*  MCV 85.8 86.5  PLT 287 237    Cardiac Enzymes: No results for input(s): CKTOTAL, CKMB, CKMBINDEX, TROPONINI in the last 168 hours.  BNP: Invalid input(s): POCBNP  CBG: No results for input(s):  GLUCAP in the last 168 hours.  Microbiology: Results for orders placed or performed during the hospital encounter of 05/03/17  MRSA PCR Screening     Status: None   Collection Time: 05/03/17  1:35 PM  Result Value Ref Range Status   MRSA by PCR NEGATIVE NEGATIVE Final    Comment:        The GeneXpert MRSA Assay (FDA approved for NASAL specimens only), is one component of a comprehensive MRSA colonization surveillance program. It is not intended to diagnose MRSA infection nor to guide or monitor treatment for MRSA infections. Performed at [facility], [address] [place], [place] [address]     Coagulation Studies: No results for input(s): LABPROT, INR in the last 72 hours.  Urinalysis: No results for input(s): COLORURINE, LABSPEC, PHURINE, GLUCOSEU, HGBUR, BILIRUBINUR, KETONESUR, PROTEINUR, UROBILINOGEN, NITRITE, LEUKOCYTESUR in the last 72 hours.  Invalid input(s): APPERANCEUR    Imaging: No results found.   Medications:       Assessment/ Plan:  [name] is a 57 y.o. black male withend stage renal disease on hemodialysis secondary to Alport's syndrome, ascites, hypertension, anemia of chronic kidney disease, coronary artery disease, peripheral vascular disease, hyperlipidemia, gastrointestinal AVMs, pulmonary hypertension,   nooutpatientdialysis unit currently  1.  Hyperkalemia 2.  Anemia of chronic kidney disease and GI Bleed 3.  Secondary Hyperparathyroidism with hyperphosphatemia.  4.  Ascites  5.  End-stage renal disease  Hemodialysis on a Monday, Wednesday Friday schedule - Fistulogram as per vascular surgery. Requested on Thursday.  Vascular surgery office will contact the patient directly   LOS: 0 Stanley [name] Stanley [name] 3/18/20194:02 PM

## 2017-05-29 NOTE — Progress Notes (Signed)
Post HD assessment.    05/29/17 1612  Neurological  Level of Consciousness Alert  Orientation Level Oriented X4  Respiratory  Respiratory Pattern Regular;Unlabored  Chest Assessment Chest expansion symmetrical  Vascular  R Radial Pulse +2  L Radial Pulse +2  Integumentary  Integumentary (WDL) X  Skin Color Appropriate for ethnicity  Musculoskeletal  Musculoskeletal (WDL) X  Generalized Weakness Yes  Assistive Device None  GU Assessment  Genitourinary (WDL) X  Genitourinary Symptoms (HD)  Psychosocial  Psychosocial (WDL) WDL

## 2017-05-29 NOTE — ED Notes (Signed)
Patient transported to dialysis by [place], NT.

## 2017-05-29 NOTE — ED Triage Notes (Signed)
Pt is here for routine dialysis. Denies any pain or other complaints today.

## 2017-05-29 NOTE — ED Notes (Signed)
Melissa, RN from dialysis called to give report on patient. Per RN, patient had 3L of fluid removed and final BP of 116/59 and HR 84. Per RN, patient eloped from treatment area and stated "My ride is here at 5. I don't have time to wait." RN unsure of where patient went. Per Brayton Layman, RN, first nurse in the ED lobby, patient was waiting in chairs for ride. Patient refused to come back to treatment area for discharge instructions and vitals. MD aware

## 2017-05-30 NOTE — ED Provider Notes (Signed)
-----------------------------------------   3:50 PM on 05/30/2017 -----------------------------------------   late Entry: Patient came in here for dialysis yesterday, he went to dialysis before I could see him and eloped before he came back.   Please see Dr. Ledell Noss note. Pt using ED for routine dialysis.    I am unable to contribute more information about this visit.   Schuyler Amor, MD 05/30/17 [phone]

## 2017-05-31 ENCOUNTER — Ambulatory Visit
Admission: EM | Admit: 2017-05-31 | Discharge: 2017-05-31 | Disposition: A | Discharge status: Home / Self Care | Payer: Medicare Other | Source: Home / Self Care | Attending: Emergency Medicine | Admitting: Emergency Medicine

## 2017-05-31 ENCOUNTER — Encounter: Payer: Self-pay | Admitting: Emergency Medicine

## 2017-05-31 DIAGNOSIS — I2510 Atherosclerotic heart disease of native coronary artery without angina pectoris: Secondary | ICD-10-CM

## 2017-05-31 DIAGNOSIS — I739 Peripheral vascular disease, unspecified: Secondary | ICD-10-CM | POA: Insufficient documentation

## 2017-05-31 DIAGNOSIS — N2581 Secondary hyperparathyroidism of renal origin: Secondary | ICD-10-CM

## 2017-05-31 DIAGNOSIS — R188 Other ascites: Secondary | ICD-10-CM

## 2017-05-31 DIAGNOSIS — E875 Hyperkalemia: Secondary | ICD-10-CM

## 2017-05-31 DIAGNOSIS — N186 End stage renal disease: Secondary | ICD-10-CM

## 2017-05-31 DIAGNOSIS — I272 Pulmonary hypertension, unspecified: Secondary | ICD-10-CM

## 2017-05-31 DIAGNOSIS — D631 Anemia in chronic kidney disease: Secondary | ICD-10-CM

## 2017-05-31 DIAGNOSIS — D649 Anemia, unspecified: Secondary | ICD-10-CM | POA: Diagnosis not present

## 2017-05-31 DIAGNOSIS — I5033 Acute on chronic diastolic (congestive) heart failure: Secondary | ICD-10-CM | POA: Diagnosis not present

## 2017-05-31 DIAGNOSIS — Q8781 Alport syndrome: Secondary | ICD-10-CM

## 2017-05-31 DIAGNOSIS — E785 Hyperlipidemia, unspecified: Secondary | ICD-10-CM

## 2017-05-31 DIAGNOSIS — I120 Hypertensive chronic kidney disease with stage 5 chronic kidney disease or end stage renal disease: Secondary | ICD-10-CM | POA: Diagnosis not present

## 2017-05-31 DIAGNOSIS — Z992 Dependence on renal dialysis: Secondary | ICD-10-CM | POA: Diagnosis not present

## 2017-05-31 DIAGNOSIS — J9601 Acute respiratory failure with hypoxia: Secondary | ICD-10-CM | POA: Diagnosis not present

## 2017-05-31 DIAGNOSIS — R531 Weakness: Secondary | ICD-10-CM | POA: Diagnosis not present

## 2017-05-31 DIAGNOSIS — K920 Hematemesis: Secondary | ICD-10-CM | POA: Diagnosis not present

## 2017-05-31 DIAGNOSIS — K210 Gastro-esophageal reflux disease with esophagitis: Secondary | ICD-10-CM | POA: Diagnosis not present

## 2017-05-31 DIAGNOSIS — K922 Gastrointestinal hemorrhage, unspecified: Secondary | ICD-10-CM | POA: Diagnosis not present

## 2017-05-31 DIAGNOSIS — I132 Hypertensive heart and chronic kidney disease with heart failure and with stage 5 chronic kidney disease, or end stage renal disease: Secondary | ICD-10-CM | POA: Insufficient documentation

## 2017-05-31 MED ORDER — EPOETIN ALFA 10000 UNIT/ML IJ SOLN: 4000.0000 [IU] | Freq: Once | INTRAMUSCULAR | Status: DC

## 2017-05-31 MED ORDER — DIPHENHYDRAMINE HCL 50 MG/ML IJ SOLN
25.0000 mg | Freq: Once | INTRAMUSCULAR | Status: AC
  Administered 2017-05-31: 25 mg via INTRAVENOUS

## 2017-05-31 NOTE — ED Notes (Signed)
Pt transported to dialysis, consent on chart.

## 2017-05-31 NOTE — ED Provider Notes (Signed)
[facility] Emergency Department Provider Note       Time seen: ----------------------------------------- 11:47 AM on 05/31/2017 -----------------------------------------   I have reviewed the triage vital signs and the nursing notes.  HISTORY   Chief Complaint Dialysis Treatment  HPI Stanley [name] is a 57 y.o. male with a history of alcohol abuse, CHF, cirrhosis, coronary artery disease, end-stage renal disease on dialysis who presents to the ED for routine dialysis treatment.  Patient has no complaints at this time.  Past Medical History:  Diagnosis Date  . Alcohol abuse   . CHF (congestive heart failure) ([place])   . Cirrhosis ([place])   . Coronary artery disease 2009  . Diabetic peripheral neuropathy ([place])   . Drug abuse ([place])   . End stage renal disease on dialysis [facility]) NEPHROLOGIST-   DR [facility]  IN [place]   HEMODIALYSIS --   TUES/  THURS/  SAT  . GERD (gastroesophageal reflux disease)   . Hyperlipidemia   . Hypertension   . PAD (peripheral artery disease) ([place])   . Renal insufficiency    Per pt, 32 oz fluid restriction per day  . S/P triple vessel bypass 06/09/2016   2009ish  . Suicidal ideation    & HOMICIDAL IDEATION --  06-16-2013   ADMITTED TO BEHAVIOR HEALTH    Patient Active Problem List   Diagnosis Date Noted  . Alcoholic hepatitis with ascites 05/24/2017  . ESRD (end stage renal disease) ([place]) 04/28/2017  . Uremia 03/08/2017  . Hemodialysis patient ([place]) 03/03/2017  . Weakness 02/28/2017  . Hypocalcemia 02/22/2017  . Shortness of breath 11/26/2016  . COPD (chronic obstructive pulmonary disease) ([place]) 10/30/2016  . COPD exacerbation ([place]) 10/29/2016  . Anemia   . Heme positive stool   . Ulceration of intestine   . Benign neoplasm of transverse colon   . Acute gastrointestinal hemorrhage   . Esophageal candidiasis ([place])   . Angiodysplasia of intestinal tract   . Acute respiratory failure with hypoxia ([place]) 07/03/2016   . GI bleeding 06/24/2016  . Rectal bleeding 06/14/2016  . Anemia of chronic disease 06/01/2016  . MRSA carrier 06/01/2016  . Chronic renal failure 05/23/2016  . Ischemic heart disease 05/23/2016  . Angiodysplasia of small intestine   . Melena   . Small bowel bleed not requiring more than 4 units of blood in 24 hours, ICU, or surgery   . Anemia due to chronic blood loss   . Abdominal pain 05/05/2016  . Acute posthemorrhagic anemia 04/17/2016  . Gastrointestinal bleed 04/17/2016  . History of esophagogastroduodenoscopy (EGD) 04/17/2016  . Elevated troponin 04/17/2016  . Alcohol abuse 04/17/2016  . Upper GI bleed 01/19/2016  . Blood in stool   . Angiodysplasia of stomach and duodenum with hemorrhage   . Gastritis   . Esophagitis, unspecified   . GI bleed 05/16/2015  . Acute GI bleeding   . Symptomatic anemia 04/30/2015  . HTN (hypertension) 04/06/2015  . GERD (gastroesophageal reflux disease) 04/06/2015  . HLD (hyperlipidemia) 04/06/2015  . Dyspnea 04/06/2015  . Cirrhosis of liver with ascites ([place]) 04/06/2015  . Ascites 04/06/2015  . GIB (gastrointestinal bleeding) 03/23/2015  . Homicidal ideation 06/19/2013  . Suicidal intent 06/19/2013  . Homicidal ideations 06/19/2013  . Hyperkalemia 06/16/2013  . Mandible fracture ([place]) 06/05/2013  . Fracture, mandible ([place]) 06/02/2013  . Coronary atherosclerosis of native coronary artery 06/02/2013  . ESRD on dialysis ([place]) 06/02/2013  . Mandible open fracture ([place]) 06/02/2013    Past Surgical History:  Procedure Laterality Date  . AGILE CAPSULE N/A 06/19/2016   Procedure: AGILE CAPSULE;  Surgeon: [name], MD;  Location: [facility] ENDOSCOPY;  Service: Endoscopy;  Laterality: N/A;  . COLONOSCOPY WITH PROPOFOL N/A 06/18/2016   Procedure: COLONOSCOPY WITH PROPOFOL;  Surgeon: [name], MD;  Location: [facility] ENDOSCOPY;  Service: Endoscopy;  Laterality: N/A;  . COLONOSCOPY WITH PROPOFOL N/A 08/12/2016   Procedure: COLONOSCOPY WITH PROPOFOL;   Surgeon: [name], MD;  Location: [facility] ENDOSCOPY;  Service: Endoscopy;  Laterality: N/A;  . COLONOSCOPY WITH PROPOFOL N/A 05/05/2017   Procedure: COLONOSCOPY WITH PROPOFOL;  Surgeon: Manya Silvas, MD;  Location: [facility] ENDOSCOPY;  Service: Endoscopy;  Laterality: N/A;  . CORONARY ANGIOPLASTY  ?   PT UNABLE TO TELL IF  BEFORE OR AFTER  CABG  . CORONARY ARTERY BYPASS GRAFT  2008  ([place])   3 VESSEL  . DIALYSIS FISTULA CREATION  LAST SURGERY  APPOX  2008  . ENTEROSCOPY N/A 05/10/2016   Procedure: ENTEROSCOPY;  Surgeon: Jerene Bears, MD;  Location: [place];  Service: Gastroenterology;  Laterality: N/A;  . ENTEROSCOPY N/A 08/12/2016   Procedure: ENTEROSCOPY;  Surgeon: [name], MD;  Location: [facility] ENDOSCOPY;  Service: Endoscopy;  Laterality: N/A;  . ESOPHAGOGASTRODUODENOSCOPY N/A 05/07/2015   Procedure: ESOPHAGOGASTRODUODENOSCOPY (EGD);  Surgeon: Hulen Luster, MD;  Location: [facility] ENDOSCOPY;  Service: Endoscopy;  Laterality: N/A;  . ESOPHAGOGASTRODUODENOSCOPY (EGD) WITH PROPOFOL N/A 05/17/2015   Procedure: ESOPHAGOGASTRODUODENOSCOPY (EGD) WITH PROPOFOL;  Surgeon: [name], MD;  Location: [facility] ENDOSCOPY;  Service: Endoscopy;  Laterality: N/A;  . ESOPHAGOGASTRODUODENOSCOPY (EGD) WITH PROPOFOL N/A 01/20/2016   Procedure: ESOPHAGOGASTRODUODENOSCOPY (EGD) WITH PROPOFOL;  Surgeon: [name], MD;  Location: [facility] ENDOSCOPY;  Service: Endoscopy;  Laterality: N/A;  . ESOPHAGOGASTRODUODENOSCOPY (EGD) WITH PROPOFOL N/A 04/17/2016   Procedure: ESOPHAGOGASTRODUODENOSCOPY (EGD) WITH PROPOFOL;  Surgeon: Lin Landsman, MD;  Location: [facility] ENDOSCOPY;  Service: Gastroenterology;  Laterality: N/A;  . ESOPHAGOGASTRODUODENOSCOPY (EGD) WITH PROPOFOL  05/09/2016   Procedure: ESOPHAGOGASTRODUODENOSCOPY (EGD) WITH PROPOFOL;  Surgeon: Jerene Bears, MD;  Location: [place];  Service: Endoscopy;;  . ESOPHAGOGASTRODUODENOSCOPY (EGD) WITH PROPOFOL N/A 06/16/2016   Procedure: ESOPHAGOGASTRODUODENOSCOPY (EGD)  WITH PROPOFOL;  Surgeon: [name], MD;  Location: [facility] ENDOSCOPY;  Service: Endoscopy;  Laterality: N/A;  . ESOPHAGOGASTRODUODENOSCOPY (EGD) WITH PROPOFOL N/A 05/05/2017   Procedure: ESOPHAGOGASTRODUODENOSCOPY (EGD) WITH PROPOFOL;  Surgeon: Manya Silvas, MD;  Location: [facility] ENDOSCOPY;  Service: Endoscopy;  Laterality: N/A;  . GIVENS CAPSULE STUDY N/A 05/07/2016   Procedure: GIVENS CAPSULE STUDY;  Surgeon: Doran Stabler, MD;  Location: [place];  Service: Endoscopy;  Laterality: N/A;  . MANDIBULAR HARDWARE REMOVAL N/A 07/29/2013   Procedure: REMOVAL OF ARCH BARS;  Surgeon: Theodoro Kos, DO;  Location: [place];  Service: Plastics;  Laterality: N/A;  . ORIF MANDIBULAR FRACTURE N/A 06/05/2013   Procedure: REPAIR OF MANDIBULAR FRACTURE x 2 with maxillo-mandibular fixation ;  Surgeon: Theodoro Kos, DO;  Location: [place];  Service: Plastics;  Laterality: N/A;  . PERIPHERAL ARTERIAL STENT GRAFT Left     Allergies Patient has no known allergies.  Social History Social History   Tobacco Use  . Smoking status: Current Every Day Smoker    Packs/day: 0.15    Years: 40.00    Pack years: 6.00    Types: Cigarettes  . Smokeless tobacco: Never Used  Substance Use Topics  . Alcohol use: No    Comment: pt reports quitting after learning about cirrhosis  . Drug use: No  Review of Systems Constitutional: Negative for fever. Cardiovascular: Negative for chest pain. Respiratory: Negative for shortness of breath. Gastrointestinal: Negative for abdominal pain, vomiting and diarrhea. Musculoskeletal: Negative for back pain. Skin: Negative for rash. Neurological: Negative for headaches, focal weakness or numbness.  All systems negative/normal/unremarkable except as stated in the HPI  ____________________________________________   PHYSICAL EXAM:  VITAL SIGNS: ED Triage Vitals  Enc Vitals Group     BP 05/31/17 1135 110/76     Pulse Rate 05/31/17 1135 84      Resp 05/31/17 1135 18     Temp 05/31/17 1135 97.7 F (36.5 C)     Temp Source 05/31/17 1135 Oral     SpO2 05/31/17 1135 100 %     Weight 05/31/17 1135 166 lb (75.3 kg)     Height 05/31/17 1135 6\' 3"  (1.905 m)     Head Circumference --      Peak Flow --      Pain Score 05/31/17 1125 0     Pain Loc --      Pain Edu? --      Excl. in [place]? --    Constitutional: Alert and oriented. Well appearing and in no distress. Eyes: Conjunctivae are normal. Normal extraocular movements. Cardiovascular: Normal rate, regular rhythm. No murmurs, rubs, or gallops. Respiratory: Normal respiratory effort without tachypnea nor retractions. Breath sounds are clear and equal bilaterally. No wheezes/rales/rhonchi. Gastrointestinal: Soft and nontender. Normal bowel sounds Musculoskeletal: Nontender with normal range of motion in extremities. No lower extremity tenderness nor edema. Neurologic:  Normal speech and language. No gross focal neurologic deficits are appreciated.  Skin:  Skin is warm, dry and intact. No rash noted. Psychiatric: Mood and affect are normal. Speech and behavior are normal.  ____________________________________________  ED COURSE:  As part of my medical decision making, I reviewed the following data within the [place] History obtained from family if available, nursing notes, old chart and ekg, as well as notes from prior ED visits. Patient presented for routine dialysis, patient appears medically clear at this time   Procedures ____________________________________________  DIFFERENTIAL DIAGNOSIS   End-stage renal disease, electrolyte abnormality  FINAL ASSESSMENT AND PLAN  End-stage renal disease on dialysis   Plan: The patient had presented for routine dialysis treatment.  He is medically cleared for dialysis treatment at this time.   Laurence Aly, MD   Note: This note was generated in part or whole with voice recognition software. Voice  recognition is usually quite accurate but there are transcription errors that can and very often do occur. I apologize for any typographical errors that were not detected and corrected.     Earleen Newport, MD 05/31/17 1155

## 2017-05-31 NOTE — Progress Notes (Signed)
Post HD assessment   05/31/17 1641  Vital Signs  Temp 98.8 F (37.1 C)  Temp Source Oral  Pulse Rate 87  Pulse Rate Source Monitor  Resp (!) 23  BP 133/79  BP Location Left Arm  BP Method Automatic  Patient Position (if appropriate) Sitting  Oxygen Therapy  SpO2 100 %  O2 Device Room Air  Dialysis Weight  Weight (unable to weigh pt, no scale available)  Type of Weight Post-Dialysis  Post-Hemodialysis Assessment  Rinseback Volume (mL) 250 mL  KECN 58 V  Dialyzer Clearance Lightly streaked  Duration of HD Treatment -hour(s) 2.5 hour(s)  Hemodialysis Intake (mL) 500 mL  UF Total -Machine (mL) 3018 mL  Net UF (mL) 2518 mL  Tolerated HD Treatment Yes  AVG/AVF Arterial Site Held (minutes) 10 minutes  AVG/AVF Venous Site Held (minutes) 10 minutes  Education / Care Plan  Dialysis Education Provided Yes  Documented Education in Care Plan Yes  Fistula / Graft Right Upper arm Arteriovenous fistula  No Placement Date or Time found.   Orientation: Right  Access Location: Upper arm  Access Type: Arteriovenous fistula  Site Condition No complications  Fistula / Graft Assessment Present;Thrill;Bruit  Status Deaccessed  Drainage Description None

## 2017-05-31 NOTE — Progress Notes (Signed)
HD tx start   05/31/17 1352  Vital Signs  Pulse Rate 77  Pulse Rate Source Monitor  Resp 13  BP 136/71  BP Location Left Arm  BP Method Automatic  Patient Position (if appropriate) Sitting  Oxygen Therapy  SpO2 100 %  O2 Device Room Air  During Hemodialysis Assessment  Blood Flow Rate (mL/min) 400 mL/min  Arterial Pressure (mmHg) -130 mmHg  Venous Pressure (mmHg) 120 mmHg  Transmembrane Pressure (mmHg) 70 mmHg  Ultrafiltration Rate (mL/min) 1200 mL/min  Dialysate Flow Rate (mL/min) 800 ml/min  Conductivity: Machine  14  HD Safety Checks Performed Yes  Dialysis Fluid Bolus Normal Saline  Bolus Amount (mL) 250 mL  Intra-Hemodialysis Comments Tx initiated

## 2017-05-31 NOTE — Progress Notes (Signed)
HD tx end   05/31/17 1627  Vital Signs  Pulse Rate 79  Pulse Rate Source Monitor  Resp 12  BP 127/79  BP Location Left Arm  BP Method Automatic  Patient Position (if appropriate) Sitting  Oxygen Therapy  SpO2 100 %  O2 Device Room Air  During Hemodialysis Assessment  Dialysis Fluid Bolus Normal Saline  Bolus Amount (mL) 250 mL  Intra-Hemodialysis Comments Tx completed

## 2017-05-31 NOTE — Progress Notes (Signed)
[facility]  ROUNDING NOTE   Subjective:   Patient presents to the emergency room for evaluation of dialysis Denies acute shortness of breath Concerned about his AV fistula but left AMA before the procedure last week because he thought he had a problem with his ride   Objective:  Vital signs in last 24 hours:  Temp:  [97.7 F (36.5 C)-98.8 F (37.1 C)] 98.8 F (37.1 C) (03/20 1341) Pulse Rate:  [73-89] 80 (03/20 1615) Resp:  [12-19] 19 (03/20 1615) BP: (110-146)/(67-81) 129/80 (03/20 1615) SpO2:  [99 %-100 %] 100 % (03/20 1615) Weight:  [75.3 kg (166 lb)-75.3 kg (166 lb 0.1 oz)] 75.3 kg (166 lb 0.1 oz) (03/20 1341)  Weight change:  Filed Weights   05/31/17 1135 05/31/17 1341  Weight: 75.3 kg (166 lb) 75.3 kg (166 lb 0.1 oz)    Intake/Output: No intake/output data recorded.   Intake/Output this shift:  No intake/output data recorded.  Physical Exam: General: NAD,   Head: Normocephalic, atraumatic. Moist oral mucosal membranes  Eyes: Anicteric,  Neck: Supple,   Lungs:  Clear to auscultation  Heart: Regular rate and rhythm  Abdomen:  Soft, nontender, distended  Extremities:  no peripheral edema.  Neurologic: Nonfocal, moving all four extremities  Skin: No lesions  Access: RUE AVF     Basic Metabolic Panel: Recent Labs  Lab 05/25/17 0501 05/29/17 1616  NA 138 132*  K 5.2* 6.1*  CL 97* 98*  CO2 27 23  GLUCOSE 85 84  BUN 53* 69*  CREATININE 10.63* 9.56*  CALCIUM 8.5* 8.3*  PHOS  --  3.1    Liver Function Tests: Recent Labs  Lab 05/29/17 1616  ALBUMIN 2.4*   No results for input(s): LIPASE, AMYLASE in the last 168 hours. No results for input(s): AMMONIA in the last 168 hours.  CBC: Recent Labs  Lab 05/25/17 0501  WBC 5.7  HGB 8.2*  HCT 26.5*  MCV 86.5  PLT 237    Cardiac Enzymes: No results for input(s): CKTOTAL, CKMB, CKMBINDEX, TROPONINI in the last 168 hours.  BNP: Invalid input(s): POCBNP  CBG: No results for  input(s): GLUCAP in the last 168 hours.  Microbiology: Results for orders placed or performed during the hospital encounter of 05/03/17  MRSA PCR Screening     Status: None   Collection Time: 05/03/17  1:35 PM  Result Value Ref Range Status   MRSA by PCR NEGATIVE NEGATIVE Final    Comment:        The GeneXpert MRSA Assay (FDA approved for NASAL specimens only), is one component of a comprehensive MRSA colonization surveillance program. It is not intended to diagnose MRSA infection nor to guide or monitor treatment for MRSA infections. Performed at [facility], [address] [place], [place] [address]     Coagulation Studies: No results for input(s): LABPROT, INR in the last 72 hours.  Urinalysis: No results for input(s): COLORURINE, LABSPEC, PHURINE, GLUCOSEU, HGBUR, BILIRUBINUR, KETONESUR, PROTEINUR, UROBILINOGEN, NITRITE, LEUKOCYTESUR in the last 72 hours.  Invalid input(s): APPERANCEUR    Imaging: No results found.   Medications:       Assessment/ Plan:  [name] is a 57 y.o. black male withend stage renal disease on hemodialysis secondary to Alport's syndrome, ascites, hypertension, anemia of chronic kidney disease, coronary artery disease, peripheral vascular disease, hyperlipidemia, gastrointestinal AVMs, pulmonary hypertension,   nooutpatientdialysis unit currently  1.  Hyperkalemia 2.  Anemia of chronic kidney disease and GI Bleed 3.  Secondary Hyperparathyroidism with hyperphosphatemia.  4.  Ascites 5.  End-stage renal disease  Hemodialysis on a Monday, Wednesday Friday schedule - Fistulogram as per vascular surgery. Requested on Thursday.  Vascular surgery office will contact the patient directly - 2 K bath UF as tolerated   LOS: 0 Brittnay Pigman [name] 3/20/20194:33 PM

## 2017-05-31 NOTE — Progress Notes (Signed)
Post HD assessment   05/31/17 1643  Neurological  Level of Consciousness Alert  Orientation Level Oriented X4  Respiratory  Respiratory Pattern Regular;Unlabored  Chest Assessment Chest expansion symmetrical  Cardiac  Pulse Regular  Vascular  R Radial Pulse +2  L Radial Pulse +2  Integumentary  Integumentary (WDL) X  Skin Color Appropriate for ethnicity  Musculoskeletal  Musculoskeletal (WDL) X  Generalized Weakness Yes  Assistive Device None  GU Assessment  Genitourinary (WDL) X  Genitourinary Symptoms None  Psychosocial  Psychosocial (WDL) WDL

## 2017-05-31 NOTE — Progress Notes (Signed)
Pre HD assessment   05/31/17 1341  Vital Signs  Temp 98.8 F (37.1 C)  Temp Source Oral  Pulse Rate 81  Pulse Rate Source Monitor  Resp 12  BP 135/74  BP Location Left Arm  BP Method Automatic  Patient Position (if appropriate) Sitting  Oxygen Therapy  SpO2 100 %  O2 Device Room Air  Pain Assessment  Pain Assessment No/denies pain  Pain Score 0  Dialysis Weight  Weight 75.3 kg (166 lb 0.1 oz)  Type of Weight Pre-Dialysis  Time-Out for Hemodialysis  What Procedure? HD  Pt Identifiers(min of two) First/Last Name;MRN/Account#  Correct Site? Yes  Correct Side? Yes  Correct Procedure? Yes  Consents Verified? Yes  Rad Studies Available? N/A  Safety Precautions Reviewed? Yes  Engineer, civil (consulting) Number ([id number])  Station Number 1  UF/Alarm Test Passed  Conductivity: Meter 13.6  Conductivity: Machine  14  pH 7.6  Reverse Osmosis main  Normal Saline Lot Number [id number]  Dialyzer Lot Number [id number]  Disposable Set Lot Number [id number]  Machine Temperature 98.6 F (37 C)  Musician and Audible Yes  Blood Lines Intact and Secured Yes  Pre Treatment Patient Checks  Vascular access used during treatment Fistula  Hepatitis B Surface Antigen Results Negative  Date Hepatitis B Surface Antigen Drawn 03/01/17  Hepatitis B Surface Antibody (>10)  Date Hepatitis B Surface Antibody Drawn 03/01/17  Hemodialysis Consent Verified Yes  Hemodialysis Standing Orders Initiated Yes  ECG (Telemetry) Monitor On Yes  Prime Ordered Normal Saline  Length of  DialysisTreatment -hour(s) 3.5 Hour(s)  Dialyzer Elisio 17H NR  Dialysate 2K, 2.25 Ca  Dialysis Anticoagulant None  Dialysate Flow Ordered 800  Blood Flow Rate Ordered 400 mL/min  Ultrafiltration Goal 2.5 Liters  Dialysis Blood Pressure Support Ordered Normal Saline  Education / Care Plan  Dialysis Education Provided Yes  Documented Education in Care Plan Yes  Fistula / Graft Right Upper arm Arteriovenous fistula  No  Placement Date or Time found.   Orientation: Right  Access Location: Upper arm  Access Type: Arteriovenous fistula  Site Condition No complications  Fistula / Graft Assessment Present;Thrill;Bruit  Drainage Description None

## 2017-05-31 NOTE — ED Notes (Signed)
Pt reports that his last HD treatment was Friday, denies SHOB or pain at this time.  Pt is A&OX4, ambulatory from triage.

## 2017-05-31 NOTE — Progress Notes (Signed)
Pre HD assessment   05/31/17 1350  Neurological  Level of Consciousness Alert  Orientation Level Oriented X4  Respiratory  Respiratory Pattern Regular;Unlabored  Cardiac  Pulse Regular  Vascular  R Radial Pulse +2  L Radial Pulse +2  Integumentary  Integumentary (WDL) X  Skin Color Appropriate for ethnicity  Musculoskeletal  Musculoskeletal (WDL) X  Generalized Weakness Yes  Assistive Device None  GU Assessment  Genitourinary (WDL) X  Genitourinary Symptoms (HD)  Psychosocial  Psychosocial (WDL) WDL

## 2017-05-31 NOTE — ED Triage Notes (Signed)
Pt here for routine dialysis treatment.

## 2017-06-01 ENCOUNTER — Encounter
Admission: EM | Disposition: A | Discharge status: Home / Self Care | Payer: Self-pay | Source: Home / Self Care | Attending: Emergency Medicine

## 2017-06-01 ENCOUNTER — Ambulatory Visit: Admission: RE | Admit: 2017-06-01 | Payer: Medicare Other | Source: Ambulatory Visit | Admitting: Vascular Surgery

## 2017-06-01 SURGERY — A/V FISTULAGRAM
Anesthesia: Moderate Sedation

## 2017-06-01 NOTE — Progress Notes (Signed)
Patient walked out of the Dialysis unit yesterday evening after his treatment He did not want to wait for the transporter Nursing report called to the ER nurse by Dialysis nurse Lenna Sciara

## 2017-06-02 ENCOUNTER — Inpatient Hospital Stay
Admission: EM | Admit: 2017-06-02 | Discharge: 2017-06-06 | DRG: 391 | Discharge status: Against Medical Advice | Payer: Medicare Other | Attending: Internal Medicine | Admitting: Internal Medicine

## 2017-06-02 ENCOUNTER — Encounter: Payer: Self-pay | Admitting: Emergency Medicine

## 2017-06-02 ENCOUNTER — Other Ambulatory Visit: Payer: Self-pay

## 2017-06-02 DIAGNOSIS — K920 Hematemesis: Secondary | ICD-10-CM

## 2017-06-02 DIAGNOSIS — Z79899 Other long term (current) drug therapy: Secondary | ICD-10-CM

## 2017-06-02 DIAGNOSIS — I2510 Atherosclerotic heart disease of native coronary artery without angina pectoris: Secondary | ICD-10-CM | POA: Diagnosis not present

## 2017-06-02 DIAGNOSIS — I5033 Acute on chronic diastolic (congestive) heart failure: Secondary | ICD-10-CM | POA: Diagnosis present

## 2017-06-02 DIAGNOSIS — J449 Chronic obstructive pulmonary disease, unspecified: Secondary | ICD-10-CM | POA: Diagnosis present

## 2017-06-02 DIAGNOSIS — E875 Hyperkalemia: Secondary | ICD-10-CM | POA: Diagnosis present

## 2017-06-02 DIAGNOSIS — K652 Spontaneous bacterial peritonitis: Secondary | ICD-10-CM | POA: Diagnosis not present

## 2017-06-02 DIAGNOSIS — R1110 Vomiting, unspecified: Secondary | ICD-10-CM | POA: Diagnosis not present

## 2017-06-02 DIAGNOSIS — Y828 Other medical devices associated with adverse incidents: Secondary | ICD-10-CM | POA: Diagnosis not present

## 2017-06-02 DIAGNOSIS — I132 Hypertensive heart and chronic kidney disease with heart failure and with stage 5 chronic kidney disease, or end stage renal disease: Secondary | ICD-10-CM | POA: Diagnosis present

## 2017-06-02 DIAGNOSIS — E1151 Type 2 diabetes mellitus with diabetic peripheral angiopathy without gangrene: Secondary | ICD-10-CM | POA: Diagnosis present

## 2017-06-02 DIAGNOSIS — J810 Acute pulmonary edema: Secondary | ICD-10-CM | POA: Diagnosis not present

## 2017-06-02 DIAGNOSIS — K7031 Alcoholic cirrhosis of liver with ascites: Secondary | ICD-10-CM | POA: Diagnosis present

## 2017-06-02 DIAGNOSIS — E440 Moderate protein-calorie malnutrition: Secondary | ICD-10-CM | POA: Diagnosis present

## 2017-06-02 DIAGNOSIS — K922 Gastrointestinal hemorrhage, unspecified: Secondary | ICD-10-CM | POA: Diagnosis not present

## 2017-06-02 DIAGNOSIS — T82898A Other specified complication of vascular prosthetic devices, implants and grafts, initial encounter: Secondary | ICD-10-CM

## 2017-06-02 DIAGNOSIS — D631 Anemia in chronic kidney disease: Secondary | ICD-10-CM | POA: Diagnosis present

## 2017-06-02 DIAGNOSIS — N2581 Secondary hyperparathyroidism of renal origin: Secondary | ICD-10-CM | POA: Diagnosis not present

## 2017-06-02 DIAGNOSIS — N186 End stage renal disease: Secondary | ICD-10-CM | POA: Diagnosis present

## 2017-06-02 DIAGNOSIS — I110 Hypertensive heart disease with heart failure: Secondary | ICD-10-CM | POA: Diagnosis not present

## 2017-06-02 DIAGNOSIS — K2100 Gastro-esophageal reflux disease with esophagitis, without bleeding: Secondary | ICD-10-CM

## 2017-06-02 DIAGNOSIS — Z681 Body mass index (BMI) 19 or less, adult: Secondary | ICD-10-CM | POA: Diagnosis not present

## 2017-06-02 DIAGNOSIS — F17210 Nicotine dependence, cigarettes, uncomplicated: Secondary | ICD-10-CM | POA: Diagnosis present

## 2017-06-02 DIAGNOSIS — J9601 Acute respiratory failure with hypoxia: Secondary | ICD-10-CM | POA: Diagnosis present

## 2017-06-02 DIAGNOSIS — K3189 Other diseases of stomach and duodenum: Secondary | ICD-10-CM

## 2017-06-02 DIAGNOSIS — Z7951 Long term (current) use of inhaled steroids: Secondary | ICD-10-CM | POA: Diagnosis not present

## 2017-06-02 DIAGNOSIS — E1142 Type 2 diabetes mellitus with diabetic polyneuropathy: Secondary | ICD-10-CM | POA: Diagnosis present

## 2017-06-02 DIAGNOSIS — K2940 Chronic atrophic gastritis without bleeding: Secondary | ICD-10-CM | POA: Diagnosis not present

## 2017-06-02 DIAGNOSIS — R188 Other ascites: Secondary | ICD-10-CM | POA: Diagnosis not present

## 2017-06-02 DIAGNOSIS — Z9119 Patient's noncompliance with other medical treatment and regimen: Secondary | ICD-10-CM | POA: Diagnosis not present

## 2017-06-02 DIAGNOSIS — T82838A Hemorrhage of vascular prosthetic devices, implants and grafts, initial encounter: Secondary | ICD-10-CM | POA: Diagnosis not present

## 2017-06-02 DIAGNOSIS — T82868A Thrombosis of vascular prosthetic devices, implants and grafts, initial encounter: Secondary | ICD-10-CM | POA: Diagnosis present

## 2017-06-02 DIAGNOSIS — Z951 Presence of aortocoronary bypass graft: Secondary | ICD-10-CM

## 2017-06-02 DIAGNOSIS — Z992 Dependence on renal dialysis: Secondary | ICD-10-CM

## 2017-06-02 DIAGNOSIS — Z841 Family history of disorders of kidney and ureter: Secondary | ICD-10-CM | POA: Diagnosis not present

## 2017-06-02 DIAGNOSIS — J441 Chronic obstructive pulmonary disease with (acute) exacerbation: Secondary | ICD-10-CM | POA: Diagnosis not present

## 2017-06-02 DIAGNOSIS — D649 Anemia, unspecified: Secondary | ICD-10-CM

## 2017-06-02 DIAGNOSIS — I8510 Secondary esophageal varices without bleeding: Secondary | ICD-10-CM

## 2017-06-02 DIAGNOSIS — E1122 Type 2 diabetes mellitus with diabetic chronic kidney disease: Secondary | ICD-10-CM | POA: Diagnosis not present

## 2017-06-02 DIAGNOSIS — D62 Acute posthemorrhagic anemia: Secondary | ICD-10-CM | POA: Diagnosis not present

## 2017-06-02 DIAGNOSIS — Z955 Presence of coronary angioplasty implant and graft: Secondary | ICD-10-CM | POA: Diagnosis not present

## 2017-06-02 DIAGNOSIS — I700 Atherosclerosis of aorta: Secondary | ICD-10-CM | POA: Diagnosis not present

## 2017-06-02 DIAGNOSIS — I509 Heart failure, unspecified: Secondary | ICD-10-CM | POA: Diagnosis not present

## 2017-06-02 DIAGNOSIS — I770 Arteriovenous fistula, acquired: Secondary | ICD-10-CM | POA: Diagnosis not present

## 2017-06-02 DIAGNOSIS — I10 Essential (primary) hypertension: Secondary | ICD-10-CM | POA: Diagnosis not present

## 2017-06-02 DIAGNOSIS — E1140 Type 2 diabetes mellitus with diabetic neuropathy, unspecified: Secondary | ICD-10-CM | POA: Diagnosis not present

## 2017-06-02 DIAGNOSIS — I8500 Esophageal varices without bleeding: Secondary | ICD-10-CM | POA: Diagnosis not present

## 2017-06-02 DIAGNOSIS — R531 Weakness: Secondary | ICD-10-CM | POA: Diagnosis not present

## 2017-06-02 DIAGNOSIS — E785 Hyperlipidemia, unspecified: Secondary | ICD-10-CM | POA: Diagnosis present

## 2017-06-02 DIAGNOSIS — J811 Chronic pulmonary edema: Secondary | ICD-10-CM | POA: Diagnosis not present

## 2017-06-02 DIAGNOSIS — R0902 Hypoxemia: Secondary | ICD-10-CM | POA: Diagnosis not present

## 2017-06-02 DIAGNOSIS — K7460 Unspecified cirrhosis of liver: Secondary | ICD-10-CM | POA: Diagnosis not present

## 2017-06-02 LAB — GLUCOSE, CAPILLARY
GLUCOSE-CAPILLARY: 91 mg/dL (ref 65–99)
GLUCOSE-CAPILLARY: 126 mg/dL — AB (ref 65–99)

## 2017-06-02 LAB — CBC
HCT: 21.2 % — ABNORMAL LOW (ref 40.0–52.0)
Hemoglobin: 6.4 g/dL — ABNORMAL LOW (ref 13.0–18.0)
MCH: 26.6 pg (ref 26.0–34.0)
MCHC: 30.3 g/dL — ABNORMAL LOW (ref 32.0–36.0)
MCV: 87.9 fL (ref 80.0–100.0)
PLATELETS: 251 10*3/uL (ref 150–440)
RBC: 2.41 MIL/uL — ABNORMAL LOW (ref 4.40–5.90)
RDW: 18.7 % — ABNORMAL HIGH (ref 11.5–14.5)
WBC: 7 10*3/uL (ref 3.8–10.6)

## 2017-06-02 LAB — PREPARE RBC (CROSSMATCH)

## 2017-06-02 LAB — BASIC METABOLIC PANEL
ANION GAP: 10 (ref 5–15)
BUN: 55 mg/dL — ABNORMAL HIGH (ref 6–20)
CALCIUM: 8.5 mg/dL — AB (ref 8.9–10.3)
CO2: 26 mmol/L (ref 22–32)
CREATININE: 7.59 mg/dL — AB (ref 0.61–1.24)
Chloride: 100 mmol/L — ABNORMAL LOW (ref 101–111)
GFR, EST AFRICAN AMERICAN: 8 mL/min — AB (ref >60)
GFR, EST NON AFRICAN AMERICAN: 7 mL/min — AB (ref >60)
Glucose, Bld: 88 mg/dL (ref 65–99)
Potassium: 6.4 mmol/L (ref 3.5–5.1)
SODIUM: 136 mmol/L (ref 135–145)

## 2017-06-02 LAB — HEPATIC FUNCTION PANEL
ALBUMIN: 2.7 g/dL — AB (ref 3.5–5.0)
ALK PHOS: 172 U/L — AB (ref 38–126)
ALT: 37 U/L (ref 17–63)
AST: 38 U/L (ref 15–41)
Bilirubin, Direct: <0.1 mg/dL — ABNORMAL LOW (ref 0.1–0.5)
TOTAL PROTEIN: 6.8 g/dL (ref 6.5–8.1)
Total Bilirubin: 0.5 mg/dL (ref 0.3–1.2)

## 2017-06-02 LAB — HEMOGLOBIN: HEMOGLOBIN: 5.6 g/dL — AB (ref 13.0–18.0)

## 2017-06-02 LAB — MRSA PCR SCREENING: MRSA BY PCR: NEGATIVE

## 2017-06-02 MED ORDER — PANTOPRAZOLE SODIUM 40 MG IV SOLR
40.0000 mg | Freq: Once | INTRAVENOUS | Status: AC
  Administered 2017-06-02: 40 mg via INTRAVENOUS
  Filled 2017-06-02: qty 40

## 2017-06-02 MED ORDER — EPOETIN ALFA 10000 UNIT/ML IJ SOLN
10000.0000 [IU] | Freq: Once | INTRAMUSCULAR | Status: AC
Start: 2017-06-02 — End: 2017-06-02
  Administered 2017-06-02: 10000 [IU] via INTRAVENOUS

## 2017-06-02 MED ORDER — SODIUM CHLORIDE 0.9% FLUSH: 3.0000 mL | INTRAVENOUS | Status: DC | PRN

## 2017-06-02 MED ORDER — ONDANSETRON HCL 4 MG/2ML IJ SOLN
4.0000 mg | Freq: Four times a day (QID) | INTRAMUSCULAR | Status: DC | PRN
  Administered 2017-06-03: 4 mg via INTRAVENOUS
  Filled 2017-06-02: qty 2

## 2017-06-02 MED ORDER — ONDANSETRON HCL 4 MG PO TABS: 4.0000 mg | ORAL_TABLET | Freq: Four times a day (QID) | ORAL | Status: DC | PRN

## 2017-06-02 MED ORDER — INSULIN ASPART 100 UNIT/ML ~~LOC~~ SOLN
0.0000 [IU] | Freq: Four times a day (QID) | SUBCUTANEOUS | Status: DC
  Administered 2017-06-02: 1 [IU] via SUBCUTANEOUS
  Filled 2017-06-02: qty 1

## 2017-06-02 MED ORDER — SODIUM CHLORIDE 0.9 % IV SOLN
50.0000 ug/h | INTRAVENOUS | Status: DC
  Administered 2017-06-02 – 2017-06-03 (×2): 50 ug/h via INTRAVENOUS
  Filled 2017-06-02 (×5): qty 1

## 2017-06-02 MED ORDER — SODIUM CHLORIDE 0.9 % IV SOLN
10.0000 mL/h | Freq: Once | INTRAVENOUS | Status: AC
  Administered 2017-06-06: 10 mL/h via INTRAVENOUS

## 2017-06-02 MED ORDER — MOMETASONE FURO-FORMOTEROL FUM 200-5 MCG/ACT IN AERO
2.0000 | INHALATION_SPRAY | Freq: Two times a day (BID) | RESPIRATORY_TRACT | Status: DC
  Administered 2017-06-02 – 2017-06-06 (×8): 2 via RESPIRATORY_TRACT
  Filled 2017-06-02: qty 8.8

## 2017-06-02 MED ORDER — NITROGLYCERIN 0.4 MG SL SUBL: 0.4000 mg | SUBLINGUAL_TABLET | SUBLINGUAL | Status: DC | PRN

## 2017-06-02 MED ORDER — SODIUM CHLORIDE 0.9% FLUSH
3.0000 mL | Freq: Two times a day (BID) | INTRAVENOUS | Status: DC
  Administered 2017-06-02 – 2017-06-06 (×7): 3 mL via INTRAVENOUS

## 2017-06-02 MED ORDER — CALCIUM ACETATE (PHOS BINDER) 667 MG PO CAPS: 2001.0000 mg | ORAL_CAPSULE | Freq: Three times a day (TID) | ORAL | Status: DC

## 2017-06-02 MED ORDER — MORPHINE SULFATE (PF) 4 MG/ML IV SOLN
4.0000 mg | INTRAVENOUS | Status: DC | PRN
  Administered 2017-06-02 – 2017-06-06 (×16): 4 mg via INTRAVENOUS
  Filled 2017-06-02 (×16): qty 1

## 2017-06-02 MED ORDER — ALBUTEROL SULFATE (2.5 MG/3ML) 0.083% IN NEBU: 2.5000 mg | INHALATION_SOLUTION | RESPIRATORY_TRACT | Status: DC | PRN

## 2017-06-02 MED ORDER — DIPHENHYDRAMINE HCL 50 MG/ML IJ SOLN
25.0000 mg | Freq: Four times a day (QID) | INTRAMUSCULAR | Status: DC | PRN
  Administered 2017-06-02 – 2017-06-06 (×5): 25 mg via INTRAVENOUS
  Filled 2017-06-02 (×4): qty 1

## 2017-06-02 MED ORDER — ACETAMINOPHEN 650 MG RE SUPP: 650.0000 mg | Freq: Four times a day (QID) | RECTAL | Status: DC | PRN

## 2017-06-02 MED ORDER — SODIUM CHLORIDE 0.9 % IV SOLN: INTRAVENOUS | Status: DC

## 2017-06-02 MED ORDER — SODIUM CHLORIDE 0.9 % IV SOLN
INTRAVENOUS | Status: DC
  Administered 2017-06-02: via INTRAVENOUS

## 2017-06-02 MED ORDER — SODIUM CHLORIDE 0.9 % IV SOLN: 250.0000 mL | INTRAVENOUS | Status: DC | PRN

## 2017-06-02 MED ORDER — TIOTROPIUM BROMIDE MONOHYDRATE 18 MCG IN CAPS
18.0000 ug | ORAL_CAPSULE | Freq: Every day | RESPIRATORY_TRACT | Status: DC
  Administered 2017-06-04 – 2017-06-06 (×3): 18 ug via RESPIRATORY_TRACT
  Filled 2017-06-02: qty 5

## 2017-06-02 MED ORDER — ACETAMINOPHEN 325 MG PO TABS
650.0000 mg | ORAL_TABLET | Freq: Four times a day (QID) | ORAL | Status: DC | PRN
  Administered 2017-06-02: 650 mg via ORAL

## 2017-06-02 MED ORDER — DIALYVITE SUPREME D 3 MG PO TABS: 1.0000 | ORAL_TABLET | Freq: Every day | ORAL | Status: DC

## 2017-06-02 MED ORDER — PANTOPRAZOLE SODIUM 40 MG IV SOLR
40.0000 mg | Freq: Two times a day (BID) | INTRAVENOUS | Status: DC
  Administered 2017-06-02 – 2017-06-06 (×8): 40 mg via INTRAVENOUS
  Filled 2017-06-02 (×8): qty 40

## 2017-06-02 NOTE — Progress Notes (Signed)
[facility]  ROUNDING NOTE   Subjective:   Patient presents to the emergency room for evaluation of hematemesis He has known GI AVMs Denies acute shortness of breath Hgb low at 6.4 Patient to receive blood transfusion K high at 6.4   Objective:  Vital signs in last 24 hours:  Temp:  [98.1 F (36.7 C)-98.3 F (36.8 C)] 98.1 F (36.7 C) (03/22 1614) Pulse Rate:  [80-87] 87 (03/22 1614) BP: (107-120)/(63-72) 120/72 (03/22 1614) SpO2:  [100 %] 100 % (03/22 1614) Weight:  [75.3 kg (166 lb)] 75.3 kg (166 lb) (03/22 1312)  Weight change:  Filed Weights   06/02/17 1312  Weight: 75.3 kg (166 lb)    Intake/Output: No intake/output data recorded.   Intake/Output this shift:  No intake/output data recorded.  Physical Exam: General: NAD,   Head: Normocephalic, atraumatic. Moist oral mucosal membranes  Eyes: Anicteric,  Neck: Supple,   Lungs:  Clear to auscultation  Heart: Regular rate and rhythm  Abdomen:  Soft, nontender, distended  Extremities:  no peripheral edema.  Neurologic: Nonfocal, moving all four extremities  Skin: No lesions  Access: RUE AVF     Basic Metabolic Panel: Recent Labs  Lab 05/29/17 1616 06/02/17 1352  NA 132* 136  K 6.1* 6.4*  CL 98* 100*  CO2 23 26  GLUCOSE 84 88  BUN 69* 55*  CREATININE 9.56* 7.59*  CALCIUM 8.3* 8.5*  PHOS 3.1  --     Liver Function Tests: Recent Labs  Lab 05/29/17 1616 06/02/17 1352  AST  --  38  ALT  --  37  ALKPHOS  --  172*  BILITOT  --  0.5  PROT  --  6.8  ALBUMIN 2.4* 2.7*   No results for input(s): LIPASE, AMYLASE in the last 168 hours. No results for input(s): AMMONIA in the last 168 hours.  CBC: Recent Labs  Lab 06/02/17 1352  WBC 7.0  HGB 6.4*  HCT 21.2*  MCV 87.9  PLT 251    Cardiac Enzymes: No results for input(s): CKTOTAL, CKMB, CKMBINDEX, TROPONINI in the last 168 hours.  BNP: Invalid input(s): POCBNP  CBG: No results for input(s): GLUCAP in the last 168  hours.  Microbiology: Results for orders placed or performed during the hospital encounter of 05/03/17  MRSA PCR Screening     Status: None   Collection Time: 05/03/17  1:35 PM  Result Value Ref Range Status   MRSA by PCR NEGATIVE NEGATIVE Final    Comment:        The GeneXpert MRSA Assay (FDA approved for NASAL specimens only), is one component of a comprehensive MRSA colonization surveillance program. It is not intended to diagnose MRSA infection nor to guide or monitor treatment for MRSA infections. Performed at [facility], [address] [place], [place] [address]     Coagulation Studies: No results for input(s): LABPROT, INR in the last 72 hours.  Urinalysis: No results for input(s): COLORURINE, LABSPEC, PHURINE, GLUCOSEU, HGBUR, BILIRUBINUR, KETONESUR, PROTEINUR, UROBILINOGEN, NITRITE, LEUKOCYTESUR in the last 72 hours.  Invalid input(s): APPERANCEUR    Imaging: No results found.   Medications:   . epoetin (EPOGEN/PROCRIT) injection  10,000 Units Intravenous Once  . insulin aspart  0-9 Units Subcutaneous Q6H  . pantoprazole (PROTONIX) IV  40 mg Intravenous Once  . pantoprazole (PROTONIX) IV  40 mg Intravenous Q12H  . sodium chloride flush  3 mL Intravenous Q12H      Assessment/ Plan:  [name]  is a 57 y.o. black male withend stage renal disease on hemodialysis secondary to Alport's syndrome, ascites, hypertension, anemia of chronic kidney disease, coronary artery disease, peripheral vascular disease, hyperlipidemia, gastrointestinal AVMs, pulmonary hypertension,   nooutpatientdialysis unit currently  1.  Hyperkalemia 2.  Anemia of chronic kidney disease and GI Bleed 3.  Secondary Hyperparathyroidism    4.  Ascites 5.  End-stage renal disease  Hemodialysis on a Monday, Wednesday Friday schedule - urgent dialysis with 2 K bath for hyperkalemia - Blood transfusion with HD - last Phos 3.1 - EPO with HD   LOS: 0 Stanley [name] 3/22/20194:22 PM

## 2017-06-02 NOTE — ED Provider Notes (Signed)
[facility] Emergency Department Provider Note   ____________________________________________   First MD Initiated Contact with Patient 06/02/17 1324     (approximate)  I have reviewed the triage vital signs and the nursing notes.   HISTORY  Chief Complaint Requesting Dialysis Treatment    HPI Chanze Teagle is a 57 y.o. male Comes in for his dialysis. He says he's been very weak and vomited blood several times today with peek clots coming out of his mouth and nose. He says he's done this 3 or 4 times previously but not told anyone.review the old record shows that the patient has actually had angiodysplasia of the esophagus and stomach and has had multiple endoscopies.   Past Medical History:  Diagnosis Date  . Alcohol abuse   . CHF (congestive heart failure) ([place])   . Cirrhosis ([place])   . Coronary artery disease 2009  . Diabetic peripheral neuropathy ([place])   . Drug abuse ([place])   . End stage renal disease on dialysis [facility]) NEPHROLOGIST-   DR [facility]  IN [place]   HEMODIALYSIS --   TUES/  THURS/  SAT  . GERD (gastroesophageal reflux disease)   . Hyperlipidemia   . Hypertension   . PAD (peripheral artery disease) ([place])   . Renal insufficiency    Per pt, 32 oz fluid restriction per day  . S/P triple vessel bypass 06/09/2016   2009ish  . Suicidal ideation    & HOMICIDAL IDEATION --  06-16-2013   ADMITTED TO BEHAVIOR HEALTH    Patient Active Problem List   Diagnosis Date Noted  . Alcoholic hepatitis with ascites 05/24/2017  . ESRD (end stage renal disease) ([place]) 04/28/2017  . Uremia 03/08/2017  . Hemodialysis patient ([place]) 03/03/2017  . Weakness 02/28/2017  . Hypocalcemia 02/22/2017  . Shortness of breath 11/26/2016  . COPD (chronic obstructive pulmonary disease) ([place]) 10/30/2016  . COPD exacerbation ([place]) 10/29/2016  . Anemia   . Heme positive stool   . Ulceration of intestine   . Benign neoplasm of transverse colon   . Acute  gastrointestinal hemorrhage   . Esophageal candidiasis ([place])   . Angiodysplasia of intestinal tract   . Acute respiratory failure with hypoxia ([place]) 07/03/2016  . GI bleeding 06/24/2016  . Rectal bleeding 06/14/2016  . Anemia of chronic disease 06/01/2016  . MRSA carrier 06/01/2016  . Chronic renal failure 05/23/2016  . Ischemic heart disease 05/23/2016  . Angiodysplasia of small intestine   . Melena   . Small bowel bleed not requiring more than 4 units of blood in 24 hours, ICU, or surgery   . Anemia due to chronic blood loss   . Abdominal pain 05/05/2016  . Acute posthemorrhagic anemia 04/17/2016  . Gastrointestinal bleed 04/17/2016  . History of esophagogastroduodenoscopy (EGD) 04/17/2016  . Elevated troponin 04/17/2016  . Alcohol abuse 04/17/2016  . Upper GI bleed 01/19/2016  . Blood in stool   . Angiodysplasia of stomach and duodenum with hemorrhage   . Gastritis   . Esophagitis, unspecified   . GI bleed 05/16/2015  . Acute GI bleeding   . Symptomatic anemia 04/30/2015  . HTN (hypertension) 04/06/2015  . GERD (gastroesophageal reflux disease) 04/06/2015  . HLD (hyperlipidemia) 04/06/2015  . Dyspnea 04/06/2015  . Cirrhosis of liver with ascites ([place]) 04/06/2015  . Ascites 04/06/2015  . GIB (gastrointestinal bleeding) 03/23/2015  . Homicidal ideation 06/19/2013  . Suicidal intent 06/19/2013  . Homicidal ideations 06/19/2013  . Hyperkalemia 06/16/2013  . Mandible fracture ([place])  06/05/2013  . Fracture, mandible ([place]) 06/02/2013  . Coronary atherosclerosis of native coronary artery 06/02/2013  . ESRD on dialysis ([place]) 06/02/2013  . Mandible open fracture ([place]) 06/02/2013    Past Surgical History:  Procedure Laterality Date  . AGILE CAPSULE N/A 06/19/2016   Procedure: AGILE CAPSULE;  Surgeon: [name], MD;  [facility] ENDOSCOPY;  Service: Endoscopy;  Laterality: N/A;  . COLONOSCOPY WITH PROPOFOL N/A 06/18/2016   Procedure: COLONOSCOPY WITH PROPOFOL;  Surgeon: [name], MD;  Location: [facility] ENDOSCOPY;  Service: Endoscopy;  Laterality: N/A;  . COLONOSCOPY WITH PROPOFOL N/A 08/12/2016   Procedure: COLONOSCOPY WITH PROPOFOL;  Surgeon: [name], MD;  Location: [facility] ENDOSCOPY;  Service: Endoscopy;  Laterality: N/A;  . COLONOSCOPY WITH PROPOFOL N/A 05/05/2017   Procedure: COLONOSCOPY WITH PROPOFOL;  Surgeon: Manya Silvas, MD;  Location: [facility] ENDOSCOPY;  Service: Endoscopy;  Laterality: N/A;  . CORONARY ANGIOPLASTY  ?   PT UNABLE TO TELL IF  BEFORE OR AFTER  CABG  . CORONARY ARTERY BYPASS GRAFT  2008  ([place] , [place])   3 VESSEL  . DIALYSIS FISTULA CREATION  LAST SURGERY  APPOX  2008  . ENTEROSCOPY N/A 05/10/2016   Procedure: ENTEROSCOPY;  Surgeon: Jerene Bears, MD;  Location: [place];  Service: Gastroenterology;  Laterality: N/A;  . ENTEROSCOPY N/A 08/12/2016   Procedure: ENTEROSCOPY;  Surgeon: [name], MD;  Location: [facility] ENDOSCOPY;  Service: Endoscopy;  Laterality: N/A;  . ESOPHAGOGASTRODUODENOSCOPY N/A 05/07/2015   Procedure: ESOPHAGOGASTRODUODENOSCOPY (EGD);  Surgeon: Hulen Luster, MD;  Location: [facility] ENDOSCOPY;  Service: Endoscopy;  Laterality: N/A;  . ESOPHAGOGASTRODUODENOSCOPY (EGD) WITH PROPOFOL N/A 05/17/2015   Procedure: ESOPHAGOGASTRODUODENOSCOPY (EGD) WITH PROPOFOL;  Surgeon: [name], MD;  Location: [facility] ENDOSCOPY;  Service: Endoscopy;  Laterality: N/A;  . ESOPHAGOGASTRODUODENOSCOPY (EGD) WITH PROPOFOL N/A 01/20/2016   Procedure: ESOPHAGOGASTRODUODENOSCOPY (EGD) WITH PROPOFOL;  Surgeon: [name], MD;  Location: [facility] ENDOSCOPY;  Service: Endoscopy;  Laterality: N/A;  . ESOPHAGOGASTRODUODENOSCOPY (EGD) WITH PROPOFOL N/A 04/17/2016   Procedure: ESOPHAGOGASTRODUODENOSCOPY (EGD) WITH PROPOFOL;  Surgeon: Lin Landsman, MD;  Location: [facility] ENDOSCOPY;  Service: Gastroenterology;  Laterality: N/A;  . ESOPHAGOGASTRODUODENOSCOPY (EGD) WITH PROPOFOL  05/09/2016   Procedure: ESOPHAGOGASTRODUODENOSCOPY (EGD) WITH PROPOFOL;  Surgeon: Jerene Bears,  MD;  Location: [place];  Service: Endoscopy;;  . ESOPHAGOGASTRODUODENOSCOPY (EGD) WITH PROPOFOL N/A 06/16/2016   Procedure: ESOPHAGOGASTRODUODENOSCOPY (EGD) WITH PROPOFOL;  Surgeon: [name], MD;  Location: [facility] ENDOSCOPY;  Service: Endoscopy;  Laterality: N/A;  . ESOPHAGOGASTRODUODENOSCOPY (EGD) WITH PROPOFOL N/A 05/05/2017   Procedure: ESOPHAGOGASTRODUODENOSCOPY (EGD) WITH PROPOFOL;  Surgeon: Manya Silvas, MD;  Location: [facility] ENDOSCOPY;  Service: Endoscopy;  Laterality: N/A;  . GIVENS CAPSULE STUDY N/A 05/07/2016   Procedure: GIVENS CAPSULE STUDY;  Surgeon: Doran Stabler, MD;  Location: [place];  Service: Endoscopy;  Laterality: N/A;  . MANDIBULAR HARDWARE REMOVAL N/A 07/29/2013   Procedure: REMOVAL OF ARCH BARS;  Surgeon: Theodoro Kos, DO;  Location: [place];  Service: Plastics;  Laterality: N/A;  . ORIF MANDIBULAR FRACTURE N/A 06/05/2013   Procedure: REPAIR OF MANDIBULAR FRACTURE x 2 with maxillo-mandibular fixation ;  Surgeon: Theodoro Kos, DO;  Location: [place];  Service: Plastics;  Laterality: N/A;  . PERIPHERAL ARTERIAL STENT GRAFT Left     Prior to Admission medications   Medication Sig Start Date End Date Taking? Authorizing Provider  albuterol (PROVENTIL HFA;VENTOLIN HFA) 108 (90 Base) MCG/ACT inhaler Inhale 2 puffs into the lungs every 6 (six) hours as needed for  wheezing or shortness of breath. 04/21/17   Lisa Roca, MD  alum & mag hydroxide-simeth (MAALOX/MYLANTA) 200-200-20 MG/5ML suspension Take 15 mLs every 4 (four) hours as needed by mouth for indigestion or heartburn. Patient not taking: Reported on 04/19/2017 01/25/17   Demetrios Loll, MD  atorvastatin (LIPITOR) 40 MG tablet Take 1 tablet (40 mg total) by mouth daily. 11/16/16   Vaughan Basta, MD  budesonide-formoterol (SYMBICORT) 160-4.5 MCG/ACT inhaler Inhale 2 puffs into the lungs daily. 11/16/16   Vaughan Basta, MD  calcium acetate (PHOSLO) 667 MG capsule Take 2,001 mg by mouth  3 (three) times daily.    [provider]  ferrous sulfate 325 (65 FE) MG tablet Take 1 tablet by mouth 3 (three) times daily. 01/21/17   [provider]  furosemide (LASIX) 80 MG tablet Take 1 tablet (80 mg total) daily by mouth. 01/25/17   Demetrios Loll, MD  gabapentin (NEURONTIN) 300 MG capsule Take 1 capsule by mouth daily as directed 08/16/16   [provider]  ipratropium-albuterol (DUONEB) 0.5-2.5 (3) MG/3ML SOLN Take 3 mLs by nebulization every 6 (six) hours as needed. 11/28/16   Loletha Grayer, MD  labetalol (NORMODYNE) 100 MG tablet Take 100 mg by mouth daily. 03/24/17   [provider]  Multiple Vitamins-Minerals-FA (DIALYVITE SUPREME D) 3 MG TABS Take 1 tablet (3 mg total) by mouth daily. 11/28/16   Loletha Grayer, MD  nitroGLYCERIN (NITROSTAT) 0.4 MG SL tablet Place 1 tablet (0.4 mg total) under the tongue every 5 (five) minutes as needed. 04/12/16   Wende Bushy, MD  pantoprazole (PROTONIX) 40 MG tablet Take 1 tablet (40 mg total) by mouth daily. 11/28/16   Loletha Grayer, MD  Spacer/Aero Chamber Mouthpiece MISC 1 Units by Does not apply route every 4 (four) hours as needed (wheezing). 02/19/17   Darel Hong, MD  tiotropium (SPIRIVA HANDIHALER) 18 MCG inhalation capsule Place 1 capsule (18 mcg total) into inhaler and inhale daily. 11/16/16 11/16/17  Vaughan Basta, MD    Allergies Patient has no known allergies.  Family History  Problem Relation Age of Onset  . Colon cancer Mother   . Cancer Father   . Cancer Sister   . Kidney disease Brother     Social History Social History   Tobacco Use  . Smoking status: Current Every Day Smoker    Packs/day: 0.15    Years: 40.00    Pack years: 6.00    Types: Cigarettes  . Smokeless tobacco: Never Used  Substance Use Topics  . Alcohol use: No    Comment: pt reports quitting after learning about cirrhosis  . Drug use: No    Frequency: 7.0 times per week    Types: Marijuana, Cocaine     Review of Systems  Constitutional: No fever/chills Eyes: No visual changes. ENT: No sore throat. Cardiovascular: Denies chest pain. Respiratory: Denies shortness of breath. Gastrointestinal: No abdominal pain.  No nausea, no vomiting.  No diarrhea.  No constipation. Genitourinary: Negative for dysuria. Musculoskeletal: Negative for back pain. Skin: Negative for rash. Neurological: Negative for headaches, focal weakness   ____________________________________________   PHYSICAL EXAM:  VITAL SIGNS: ED Triage Vitals [06/02/17 1312]  Enc Vitals Group     BP 107/63     Pulse Rate 80     Resp      Temp 98.3 F (36.8 C)     Temp Source Oral     SpO2 100 %     Weight 166 lb (75.3 kg)  Height 6\' 3"  (1.905 m)     Head Circumference      Peak Flow      Pain Score 0     Pain Loc      Pain Edu?      Excl. in [place]?    Constitutional: Alert and oriented. Well appearing and in no acute distress. Eyes: Conjunctivae are normal.  Head: Atraumatic. Nose: No congestion/rhinnorhea. Mouth/Throat: Mucous membranes are moist.  Oropharynx non-erythematous. Neck: No stridor.   Cardiovascular: Normal rate, regular rhythm. Grossly normal heart sounds.  Good peripheral circulation. Respiratory: Normal respiratory effort.  No retractions. Lungs CTAB. Gastrointestinal: Soft and nontender. No distention. No abdominal bruits. No CVA tenderness. Musculoskeletal: No lower extremity tenderness nor edema.  No joint effusions. Neurologic:  Normal speech and language. No gross focal neurologic deficits are appreciated. No gait instability. Skin:  Skin is warm, dry and intact. No rash noted. Psychiatric: Mood and affect are normal. Speech and behavior are normal.  ____________________________________________   LABS (all labs ordered are listed, but only abnormal results are displayed)  Labs Reviewed  BASIC METABOLIC PANEL - Abnormal; Notable for the following components:      Result Value    Potassium 6.4 (*)    Chloride 100 (*)    BUN 55 (*)    Creatinine, Ser 7.59 (*)    Calcium 8.5 (*)    GFR calc non Af Amer 7 (*)    GFR calc Af Amer 8 (*)    All other components within normal limits  CBC - Abnormal; Notable for the following components:   RBC 2.41 (*)    Hemoglobin 6.4 (*)    HCT 21.2 (*)    MCHC 30.3 (*)    RDW 18.7 (*)    All other components within normal limits  HEPATIC FUNCTION PANEL - Abnormal; Notable for the following components:   Albumin 2.7 (*)    Alkaline Phosphatase 172 (*)    Bilirubin, Direct <0.1 (*)    All other components within normal limits  PREPARE RBC (CROSSMATCH)   ____________________________________________  EKG   ____________________________________________  RADIOLOGY  ED MD interpretatio  Official radiology report(s): No results found.  ____________________________________________   PROCEDURES  Procedure(s) performed:   Procedures  Critical Care performed:   ____________________________________________   INITIAL IMPRESSION / ASSESSMENT AND PLAN / ED COURSE  patient is not orthostatic but his H&H is dropped from 8 to 6 and  26 to 21.view of his history of cirrhosis angiodysplasia we will put him in the hospital and transfuse him in and do his dialysis. I have let Dr. Vicente Males the gastroenterologist know.   Clinical Course as of Jun 03 1450  Fri Jun 02, 2017  1438 Bilirubin, Direct(!): <0.1 [PM]    Clinical Course User Index [PM] Nena Polio, MD     ____________________________________________   FINAL CLINICAL IMPRESSION(S) / ED DIAGNOSES  Final diagnoses:  Gastrointestinal hemorrhage with hematemesis  Anemia, unspecified type     ED Discharge Orders    None       Note:  This document was prepared using Dragon voice recognition software and may include unintentional dictation errors.    Nena Polio, MD 06/02/17 1452

## 2017-06-02 NOTE — H&P (Addendum)
Stanley [name] at [place] NAME: Stanley [name]    MR#:  [id number]  DATE OF BIRTH:  07-Feb-1961  DATE OF ADMISSION:  06/02/2017  PRIMARY CARE PHYSICIAN: Alene Mires Elyse Jarvis, MD   REQUESTING/REFERRING PHYSICIAN: Nena Polio, MD  CHIEF COMPLAINT:   Chief Complaint  Patient presents with  . Requesting Dialysis Treatment   Vomiting blood today. HISTORY OF PRESENT ILLNESS:  Stanley [name]  is a 57 y.o. male with a known history of multiple medical problems as below and recurrent admissions.  He was just discharged after treatment of fluid overload and hyperkalemia 1 week ago.  He complains of vomiting blood for about 1-1/2 cups this morning.  He also complains of generalized weakness and dizziness.  He complains of melena several days ago.  His hemoglobin was 8.2 one week ago but decreased to 6.4 today.  Potassium is up to 6.4.  The patient has not got dialysis today.  PAST MEDICAL HISTORY:   Past Medical History:  Diagnosis Date  . Alcohol abuse   . CHF (congestive heart failure) ([place])   . Cirrhosis ([place])   . Coronary artery disease 2009  . Diabetic peripheral neuropathy ([place])   . Drug abuse ([place])   . End stage renal disease on dialysis [facility]) NEPHROLOGIST-   DR [facility]  IN [place]   HEMODIALYSIS --   TUES/  THURS/  SAT  . GERD (gastroesophageal reflux disease)   . Hyperlipidemia   . Hypertension   . PAD (peripheral artery disease) ([place])   . Renal insufficiency    Per pt, 32 oz fluid restriction per day  . S/P triple vessel bypass 06/09/2016   2009ish  . Suicidal ideation    & HOMICIDAL IDEATION --  06-16-2013   ADMITTED TO BEHAVIOR HEALTH    PAST SURGICAL HISTORY:   Past Surgical History:  Procedure Laterality Date  . AGILE CAPSULE N/A 06/19/2016   Procedure: AGILE CAPSULE;  Surgeon: [name], MD;  Location: [facility] ENDOSCOPY;  Service: Endoscopy;  Laterality: N/A;  . COLONOSCOPY WITH PROPOFOL N/A 06/18/2016   Procedure:  COLONOSCOPY WITH PROPOFOL;  Surgeon: [name], MD;  Location: [facility] ENDOSCOPY;  Service: Endoscopy;  Laterality: N/A;  . COLONOSCOPY WITH PROPOFOL N/A 08/12/2016   Procedure: COLONOSCOPY WITH PROPOFOL;  Surgeon: [name], MD;  Location: [facility] ENDOSCOPY;  Service: Endoscopy;  Laterality: N/A;  . COLONOSCOPY WITH PROPOFOL N/A 05/05/2017   Procedure: COLONOSCOPY WITH PROPOFOL;  Surgeon: Manya Silvas, MD;  Location: [facility] ENDOSCOPY;  Service: Endoscopy;  Laterality: N/A;  . CORONARY ANGIOPLASTY  ?   PT UNABLE TO TELL IF  BEFORE OR AFTER  CABG  . CORONARY ARTERY BYPASS GRAFT  2008  ([place] , [place])   3 VESSEL  . DIALYSIS FISTULA CREATION  LAST SURGERY  APPOX  2008  . ENTEROSCOPY N/A 05/10/2016   Procedure: ENTEROSCOPY;  Surgeon: Jerene Bears, MD;  Location: [place];  Service: Gastroenterology;  Laterality: N/A;  . ENTEROSCOPY N/A 08/12/2016   Procedure: ENTEROSCOPY;  Surgeon: [name], MD;  Location: [facility] ENDOSCOPY;  Service: Endoscopy;  Laterality: N/A;  . ESOPHAGOGASTRODUODENOSCOPY N/A 05/07/2015   Procedure: ESOPHAGOGASTRODUODENOSCOPY (EGD);  Surgeon: Hulen Luster, MD;  Location: [facility] ENDOSCOPY;  Service: Endoscopy;  Laterality: N/A;  . ESOPHAGOGASTRODUODENOSCOPY (EGD) WITH PROPOFOL N/A 05/17/2015   Procedure: ESOPHAGOGASTRODUODENOSCOPY (EGD) WITH PROPOFOL;  Surgeon: [name], MD;  Location: [facility] ENDOSCOPY;  Service: Endoscopy;  Laterality: N/A;  . ESOPHAGOGASTRODUODENOSCOPY (EGD) WITH PROPOFOL N/A 01/20/2016  Procedure: ESOPHAGOGASTRODUODENOSCOPY (EGD) WITH PROPOFOL;  Surgeon: [name], MD;  Location: [facility] ENDOSCOPY;  Service: Endoscopy;  Laterality: N/A;  . ESOPHAGOGASTRODUODENOSCOPY (EGD) WITH PROPOFOL N/A 04/17/2016   Procedure: ESOPHAGOGASTRODUODENOSCOPY (EGD) WITH PROPOFOL;  Surgeon: Lin Landsman, MD;  Location: [facility] ENDOSCOPY;  Service: Gastroenterology;  Laterality: N/A;  . ESOPHAGOGASTRODUODENOSCOPY (EGD) WITH PROPOFOL  05/09/2016   Procedure: ESOPHAGOGASTRODUODENOSCOPY  (EGD) WITH PROPOFOL;  Surgeon: Jerene Bears, MD;  Location: [place];  Service: Endoscopy;;  . ESOPHAGOGASTRODUODENOSCOPY (EGD) WITH PROPOFOL N/A 06/16/2016   Procedure: ESOPHAGOGASTRODUODENOSCOPY (EGD) WITH PROPOFOL;  Surgeon: [name], MD;  Location: [facility] ENDOSCOPY;  Service: Endoscopy;  Laterality: N/A;  . ESOPHAGOGASTRODUODENOSCOPY (EGD) WITH PROPOFOL N/A 05/05/2017   Procedure: ESOPHAGOGASTRODUODENOSCOPY (EGD) WITH PROPOFOL;  Surgeon: Manya Silvas, MD;  Location[facility] ENDOSCOPY;  Service: Endoscopy;  Laterality: N/A;  . GIVENS CAPSULE STUDY N/A 05/07/2016   Procedure: GIVENS CAPSULE STUDY;  Surgeon: Doran Stabler, MD;  Location: [place];  Service: Endoscopy;  Laterality: N/A;  . MANDIBULAR HARDWARE REMOVAL N/A 07/29/2013   Procedure: REMOVAL OF ARCH BARS;  Surgeon: Theodoro Kos, DO;  Location: [place];  Service: Plastics;  Laterality: N/A;  . ORIF MANDIBULAR FRACTURE N/A 06/05/2013   Procedure: REPAIR OF MANDIBULAR FRACTURE x 2 with maxillo-mandibular fixation ;  Surgeon: Theodoro Kos, DO;  Location: [place];  Service: Plastics;  Laterality: N/A;  . PERIPHERAL ARTERIAL STENT GRAFT Left     SOCIAL HISTORY:   Social History   Tobacco Use  . Smoking status: Current Every Day Smoker    Packs/day: 0.15    Years: 40.00    Pack years: 6.00    Types: Cigarettes  . Smokeless tobacco: Never Used  Substance Use Topics  . Alcohol use: No    Comment: pt reports quitting after learning about cirrhosis    FAMILY HISTORY:   Family History  Problem Relation Age of Onset  . Colon cancer Mother   . Cancer Father   . Cancer Sister   . Kidney disease Brother     DRUG ALLERGIES:  No Known Allergies  REVIEW OF SYSTEMS:   Review of Systems  Constitutional: Positive for malaise/fatigue. Negative for chills and fever.  HENT: Negative for sore throat.   Eyes: Negative for blurred vision and double vision.  Respiratory: Negative for cough, hemoptysis,  shortness of breath, wheezing and stridor.   Cardiovascular: Negative for chest pain, palpitations, orthopnea and leg swelling.  Gastrointestinal: Positive for melena, nausea and vomiting. Negative for abdominal pain, blood in stool and diarrhea.  Genitourinary: Negative for dysuria, flank pain and hematuria.  Musculoskeletal: Negative for back pain and joint pain.  Skin: Negative for rash.  Neurological: Negative for dizziness, sensory change, focal weakness, seizures, loss of consciousness, weakness and headaches.  Endo/Heme/Allergies: Negative for polydipsia.  Psychiatric/Behavioral: Negative for depression. The patient is not nervous/anxious.     MEDICATIONS AT HOME:   Prior to Admission medications   Medication Sig Start Date End Date Taking? Authorizing Provider  albuterol (PROVENTIL HFA;VENTOLIN HFA) 108 (90 Base) MCG/ACT inhaler Inhale 2 puffs into the lungs every 6 (six) hours as needed for wheezing or shortness of breath. 04/21/17   Lisa Roca, MD  alum & mag hydroxide-simeth (MAALOX/MYLANTA) 200-200-20 MG/5ML suspension Take 15 mLs every 4 (four) hours as needed by mouth for indigestion or heartburn. Patient not taking: Reported on 04/19/2017 01/25/17   Demetrios Loll, MD  atorvastatin (LIPITOR) 40 MG tablet Take 1 tablet (40 mg total) by mouth daily. 11/16/16  Vaughan Basta, MD  budesonide-formoterol [facility]) 160-4.5 MCG/ACT inhaler Inhale 2 puffs into the lungs daily. 11/16/16   Vaughan Basta, MD  calcium acetate (PHOSLO) 667 MG capsule Take 2,001 mg by mouth 3 (three) times daily.    [provider]  ferrous sulfate 325 (65 FE) MG tablet Take 1 tablet by mouth 3 (three) times daily. 01/21/17   [provider]  furosemide (LASIX) 80 MG tablet Take 1 tablet (80 mg total) daily by mouth. 01/25/17   Demetrios Loll, MD  gabapentin (NEURONTIN) 300 MG capsule Take 1 capsule by mouth daily as directed 08/16/16   [provider]  ipratropium-albuterol  (DUONEB) 0.5-2.5 (3) MG/3ML SOLN Take 3 mLs by nebulization every 6 (six) hours as needed. 11/28/16   Loletha Grayer, MD  labetalol (NORMODYNE) 100 MG tablet Take 100 mg by mouth daily. 03/24/17   [provider]  Multiple Vitamins-Minerals-FA (DIALYVITE SUPREME D) 3 MG TABS Take 1 tablet (3 mg total) by mouth daily. 11/28/16   Loletha Grayer, MD  nitroGLYCERIN (NITROSTAT) 0.4 MG SL tablet Place 1 tablet (0.4 mg total) under the tongue every 5 (five) minutes as needed. 04/12/16   Wende Bushy, MD  pantoprazole (PROTONIX) 40 MG tablet Take 1 tablet (40 mg total) by mouth daily. 11/28/16   Loletha Grayer, MD  Spacer/Aero Chamber Mouthpiece MISC 1 Units by Does not apply route every 4 (four) hours as needed (wheezing). 02/19/17   Darel Hong, MD  tiotropium (SPIRIVA HANDIHALER) 18 MCG inhalation capsule Place 1 capsule (18 mcg total) into inhaler and inhale daily. 11/16/16 11/16/17  Vaughan Basta, MD      VITAL SIGNS:  Blood pressure 107/63, pulse 80, temperature 98.3 F (36.8 C), temperature source Oral, height 6\' 3"  (1.905 m), weight 166 lb (75.3 kg), SpO2 100 %.  PHYSICAL EXAMINATION:  Physical Exam  GENERAL:  57 y.o.-year-old patient lying in the bed with no acute distress.  EYES: Pupils equal, round, reactive to light and accommodation. No scleral icterus. Extraocular muscles intact.  HEENT: Head atraumatic, normocephalic. Oropharynx and nasopharynx clear.  NECK:  Supple, no jugular venous distention. No thyroid enlargement, no tenderness.  LUNGS: Normal breath sounds bilaterally, mild expiratory wheezing, no rales,rhonchi or crepitation. No use of accessory muscles of respiration.  CARDIOVASCULAR: S1, S2 normal. No murmurs, rubs, or gallops.  ABDOMEN: Soft, nontender, mild distended. Bowel sounds present. Has organomegaly.  EXTREMITIES: No pedal edema, cyanosis, or clubbing.  NEUROLOGIC: Cranial nerves II through XII are intact. Muscle strength 5/5 in all extremities.  Sensation intact. Gait not checked.  PSYCHIATRIC: The patient is alert and oriented x 3.  SKIN: No obvious rash, lesion, or ulcer.   LABORATORY PANEL:   CBC Recent Labs  Lab 06/02/17 1352  WBC 7.0  HGB 6.4*  HCT 21.2*  PLT 251   ------------------------------------------------------------------------------------------------------------------  Chemistries  Recent Labs  Lab 06/02/17 1352  NA 136  K 6.4*  CL 100*  CO2 26  GLUCOSE 88  BUN 55*  CREATININE 7.59*  CALCIUM 8.5*  AST 38  ALT 37  ALKPHOS 172*  BILITOT 0.5   ------------------------------------------------------------------------------------------------------------------  Cardiac Enzymes No results for input(s): TROPONINI in the last 168 hours. ------------------------------------------------------------------------------------------------------------------  RADIOLOGY:  No results found.    IMPRESSION AND PLAN:   Upper GI bleeding, possible due to varices or angiodysplasia of the esophagus The patient will be admitted to medical floor. N.p.o. with IV fluid support, start Protonix IV and octreotide drip. GI consult.  Anemia of chronic disease and due to acute  blood loss. Type and cross, PRBC transfusion 2 unit, follow-up hemoglobin every 6 hours.  Hyperkalemia.  Urgent hemodialysis. ESRD on hemodialysis.  Continue hemodialysis. Diabetes.  Sliding scale every 6 hours. Hypertension.  Hold hypertension medication due to low side blood pressure. COPD.  Continue home nebulizer. Discussed with Dr. [name]. All the records are reviewed and case discussed with ED provider. Management plans discussed with the patient, family and they are in agreement.  CODE STATUS: Full code  TOTAL TIME TAKING CARE OF THIS PATIENT: 58 minutes.    Demetrios Loll M.D on 06/02/2017 at 3:55 PM  Between 7am to 6pm - Pager - [phone]  After 6pm go to [email or web address] - Proofreader  [facility] [place]  [facility]  Office  [phone]  CC: Primary care physician; [name], MD   Note: This dictation was prepared with Dragon dictation along with smaller phrase technology. Any transcriptional errors that result from this process are unin

## 2017-06-02 NOTE — Care Management (Signed)
Amanda Morris dialysis liaison notified of admission.    

## 2017-06-02 NOTE — Progress Notes (Signed)
Notified MD of pt request for benadryl, orders taken.

## 2017-06-02 NOTE — ED Triage Notes (Signed)
Patient presents to ED via POV from home requesting dialysis. Patient gets dialysis M,W,F. Patient has not been to kidney center "in a long time because I cussed them out. They weren't doing their job so now I come here".

## 2017-06-02 NOTE — ED Notes (Signed)
Pt states that he is here for dialysis treatment. Last treatment on Wednesday. Pt denies any complaints at this time. Pts breathing is equal and unlabored at this time.

## 2017-06-02 NOTE — Progress Notes (Signed)
Hd started  

## 2017-06-02 NOTE — ED Notes (Signed)
Pt given ginger ale to drink. 

## 2017-06-02 NOTE — Progress Notes (Signed)
Hd completed 

## 2017-06-02 NOTE — ED Notes (Signed)
Sarah, EDT called to take patient to the floor

## 2017-06-03 ENCOUNTER — Inpatient Hospital Stay: Payer: Medicare Other

## 2017-06-03 ENCOUNTER — Encounter
Admission: EM | Discharge status: Against Medical Advice | Payer: Self-pay | Source: Home / Self Care | Attending: Internal Medicine

## 2017-06-03 ENCOUNTER — Inpatient Hospital Stay: Payer: Medicare Other | Admitting: Anesthesiology

## 2017-06-03 ENCOUNTER — Encounter: Payer: Self-pay | Admitting: Anesthesiology

## 2017-06-03 DIAGNOSIS — I8500 Esophageal varices without bleeding: Secondary | ICD-10-CM

## 2017-06-03 DIAGNOSIS — D62 Acute posthemorrhagic anemia: Secondary | ICD-10-CM

## 2017-06-03 DIAGNOSIS — K920 Hematemesis: Secondary | ICD-10-CM

## 2017-06-03 HISTORY — PX: ENTEROSCOPY: SHX5533

## 2017-06-03 LAB — BASIC METABOLIC PANEL
ANION GAP: 9 (ref 5–15)
BUN: 34 mg/dL — ABNORMAL HIGH (ref 6–20)
CHLORIDE: 103 mmol/L (ref 101–111)
CO2: 29 mmol/L (ref 22–32)
Calcium: 8 mg/dL — ABNORMAL LOW (ref 8.9–10.3)
Creatinine, Ser: 4.75 mg/dL — ABNORMAL HIGH (ref 0.61–1.24)
GFR calc Af Amer: 14 mL/min — ABNORMAL LOW (ref >60)
GFR calc non Af Amer: 12 mL/min — ABNORMAL LOW (ref >60)
Glucose, Bld: 97 mg/dL (ref 65–99)
POTASSIUM: 5.2 mmol/L — AB (ref 3.5–5.1)
Sodium: 141 mmol/L (ref 135–145)

## 2017-06-03 LAB — TYPE AND SCREEN
ABO/RH(D): A POS
ANTIBODY SCREEN: NEGATIVE
Unit division: 0
Unit division: 0

## 2017-06-03 LAB — RETICULOCYTES
RBC.: 3.96 MIL/uL — AB (ref 4.40–5.90)
RETIC COUNT ABSOLUTE: 162.4 10*3/uL (ref 19.0–183.0)
RETIC CT PCT: 4.1 % — AB (ref 0.4–3.1)

## 2017-06-03 LAB — GLUCOSE, CAPILLARY
GLUCOSE-CAPILLARY: 69 mg/dL (ref 65–99)
GLUCOSE-CAPILLARY: 76 mg/dL (ref 65–99)
Glucose-Capillary: 100 mg/dL — ABNORMAL HIGH (ref 65–99)
Glucose-Capillary: 82 mg/dL (ref 65–99)
Glucose-Capillary: 67 mg/dL (ref 65–99)
Glucose-Capillary: 78 mg/dL (ref 65–99)

## 2017-06-03 LAB — HEMOGLOBIN
HEMOGLOBIN: 11.1 g/dL — AB (ref 13.0–18.0)
HEMOGLOBIN: 7.2 g/dL — AB (ref 13.0–18.0)
HEMOGLOBIN: 9.4 g/dL — AB (ref 13.0–18.0)
Hemoglobin: 8.1 g/dL — ABNORMAL LOW (ref 13.0–18.0)

## 2017-06-03 LAB — BPAM RBC
BLOOD PRODUCT EXPIRATION DATE: 201904072359
Blood Product Expiration Date: 201904072359
ISSUE DATE / TIME: 201903221932
ISSUE DATE / TIME: 201903221932
UNIT TYPE AND RH: 6200
Unit Type and Rh: 6200

## 2017-06-03 LAB — IRON AND TIBC
Iron: 55 ug/dL (ref 45–182)
Saturation Ratios: 17 % — ABNORMAL LOW (ref 17.9–39.5)
TIBC: 332 ug/dL (ref 250–450)
UIBC: 277 ug/dL

## 2017-06-03 LAB — FOLATE: FOLATE: 16.7 ng/mL (ref >5.9)

## 2017-06-03 LAB — FERRITIN: FERRITIN: 53 ng/mL (ref 24–336)

## 2017-06-03 SURGERY — ENTEROSCOPY
Anesthesia: General | Laterality: Left

## 2017-06-03 MED ORDER — LABETALOL HCL 100 MG PO TABS
100.0000 mg | ORAL_TABLET | Freq: Every day | ORAL | Status: DC
  Filled 2017-06-03 (×4): qty 1

## 2017-06-03 MED ORDER — PROPOFOL 500 MG/50ML IV EMUL
INTRAVENOUS | Status: DC | PRN
  Administered 2017-06-03: 150 ug/kg/min via INTRAVENOUS

## 2017-06-03 MED ORDER — SODIUM CHLORIDE 0.9 % IV SOLN
1.0000 g | INTRAVENOUS | Status: DC
  Administered 2017-06-03 – 2017-06-05 (×3): 1 g via INTRAVENOUS
  Filled 2017-06-03 (×4): qty 10

## 2017-06-03 MED ORDER — PROPOFOL 10 MG/ML IV BOLUS
INTRAVENOUS | Status: AC
  Filled 2017-06-03: qty 20

## 2017-06-03 MED ORDER — PROPOFOL 10 MG/ML IV BOLUS
INTRAVENOUS | Status: DC | PRN
  Administered 2017-06-03: 60 mg via INTRAVENOUS
  Administered 2017-06-03 (×3): 50 mg via INTRAVENOUS

## 2017-06-03 MED ORDER — PHENOL 1.4 % MT LIQD
1.0000 | OROMUCOSAL | Status: DC | PRN
  Filled 2017-06-03: qty 177

## 2017-06-03 MED ORDER — ASPIRIN 81 MG PO CHEW
81.0000 mg | CHEWABLE_TABLET | Freq: Every day | ORAL | Status: DC
  Administered 2017-06-04: 81 mg via ORAL
  Filled 2017-06-03: qty 1

## 2017-06-03 MED ORDER — ATORVASTATIN CALCIUM 20 MG PO TABS
40.0000 mg | ORAL_TABLET | Freq: Every day | ORAL | Status: DC
  Administered 2017-06-04 – 2017-06-05 (×2): 40 mg via ORAL
  Filled 2017-06-03 (×2): qty 2

## 2017-06-03 MED ORDER — PHENYLEPHRINE HCL 10 MG/ML IJ SOLN
INTRAMUSCULAR | Status: AC
  Filled 2017-06-03: qty 1

## 2017-06-03 MED ORDER — EPINEPHRINE PF 1 MG/10ML IJ SOSY
PREFILLED_SYRINGE | INTRAMUSCULAR | Status: AC
  Filled 2017-06-03: qty 10

## 2017-06-03 MED ORDER — LIDOCAINE HCL (CARDIAC) 20 MG/ML IV SOLN
INTRAVENOUS | Status: DC | PRN
  Administered 2017-06-03: 100 mg via INTRAVENOUS

## 2017-06-03 MED ORDER — LIDOCAINE HCL (PF) 2 % IJ SOLN
INTRAMUSCULAR | Status: AC
  Filled 2017-06-03: qty 10

## 2017-06-03 MED ORDER — IOPAMIDOL (ISOVUE-370) INJECTION 76%
75.0000 mL | Freq: Once | INTRAVENOUS | Status: AC | PRN
  Administered 2017-06-03: 75 mL via INTRAVENOUS

## 2017-06-03 MED ORDER — FUROSEMIDE 40 MG PO TABS
80.0000 mg | ORAL_TABLET | Freq: Every day | ORAL | Status: DC
  Filled 2017-06-03: qty 2

## 2017-06-03 MED ORDER — GABAPENTIN 100 MG PO CAPS
100.0000 mg | ORAL_CAPSULE | Freq: Two times a day (BID) | ORAL | Status: DC
  Administered 2017-06-03 – 2017-06-05 (×5): 100 mg via ORAL
  Filled 2017-06-03 (×5): qty 1

## 2017-06-03 NOTE — Consult Note (Signed)
[name], MD [address] [place], [place], [place], [id number], [place], [place], [place], [address] Phone: [phone]  Fax: [phone]  Consultation  Referring Provider:     Dr. Benjie Karvonen Primary Care Physician:  Alene Mires Elyse Jarvis, MD Primary Gastroenterologist: Dr. Vira Agar Reason for Consultation:     Hematemesis  Date of Admission:  06/02/2017 Date of Consultation:  06/03/2017         HPI:   Stanley [name] is a 57 y.o. male with a history of alcoholic cirrhosis, and repeated noncompliance, presents with 1 day history of hematemesis.  Patient reports 3 episodes of bright red clotted blood on emesis yesterday.  The episodes occurred over 3-4-hour time span.  This occurred yesterday morning, and has not reoccurred since then.  Patient denies any NSAID use.  Denies any abdominal pain.  Denies any melena.  Denies any episodes of confusion.  Patient has history of leaving AMA several times.    He is an ESRD patient on dialysis, and does not go to his scheduled dialysis.  Was found to be hyperkalemic yesterday, and underwent dialysis on presentation to the ER.  Hemoglobin of 5.6 yesterday and transfused, with repeat at 7.2.  Labs from this morning pending.  Hemoglobin was 5.6  previous GI history obtained from previous documentation as per below:  Last colonoscopy for GI bleed, May 05, 2017 by Dr. Vira Agar: No blood seen.  Scope advanced to the ascending colon.  Polyps removed in the ascending colon, transverse colon, sigmoid colon with hot snare. Last EGD for GI bleed, May 05, 2017 by Dr. Vira Agar.  LA grade a reflux esophagitis, grade 1 esophageal varices, granular gastric mucosa.  No specimens collected.  Dr. Georgeann Oppenheim consult note previously notes the following "08/12/16 -colonoscopy few non bleeding AVM;s ablated  08/12/16- Enteroscopy - Few non bleeding duodenal AVM;s seen and ablated.  capsule study 06/21/16- normal  EGD 06/16/16- normal ,  colonoscopy , 06/18/16 - poor prep but no source of bleeding seen .  Push enteroscopy in 04/2016- AVM in jejunum ablated. This was revealed after a capsule study on 05/10/16 .  04/17/16 -push enteroscopy was normal  01/20/16-normal EGD except for gastritis 04/2015- colonoscopy showed no source of bleeding - very rapid witdrawal time of 3 mins"   As per previous documentation, and Dr. Georgeann Oppenheim notes from October 2018 "He has been informed on multiple occasions that he needs to follow up with [facility] for small bowel enteroscopy to evaluate for small bowel AVM's but he doesn't do so."  Patient reports abdominal distention, however, states it is much less than it usually is by the time he gets his paracentesis.  Last paracentesis on May 28, 2017, with 9 L removed.  Past Medical History:  Diagnosis Date  . Alcohol abuse   . CHF (congestive heart failure) ([place])   . Cirrhosis ([place])   . Coronary artery disease 2009  . Diabetic peripheral neuropathy ([place])   . Drug abuse ([place])   . End stage renal disease on dialysis [facility]) NEPHROLOGIST-   DR [facility]  IN [place]   HEMODIALYSIS --   TUES/  THURS/  SAT  . GERD (gastroesophageal reflux disease)   . Hyperlipidemia   . Hypertension   . PAD (peripheral artery disease) ([place])   . Renal insufficiency    Per pt, 32 oz fluid restriction per day  . S/P triple vessel bypass 06/09/2016   2009ish  . Suicidal ideation    & HOMICIDAL IDEATION --  06-16-2013   ADMITTED TO BEHAVIOR HEALTH    Past Surgical History:  Procedure Laterality Date  . AGILE CAPSULE N/A 06/19/2016   Procedure: AGILE CAPSULE;  Surgeon: [name], MD;  Location: [facility] ENDOSCOPY;  Service: Endoscopy;  Laterality: N/A;  . COLONOSCOPY WITH PROPOFOL N/A 06/18/2016   Procedure: COLONOSCOPY WITH PROPOFOL;  Surgeon: [name], MD;  Location: [facility] ENDOSCOPY;  Service: Endoscopy;  Laterality: N/A;  . COLONOSCOPY WITH PROPOFOL N/A 08/12/2016   Procedure: COLONOSCOPY WITH PROPOFOL;  Surgeon: [name], MD;   Location: [facility] ENDOSCOPY;  Service: Endoscopy;  Laterality: N/A;  . COLONOSCOPY WITH PROPOFOL N/A 05/05/2017   Procedure: COLONOSCOPY WITH PROPOFOL;  Surgeon: Manya Silvas, MD;  Location: [facility] ENDOSCOPY;  Service: Endoscopy;  Laterality: N/A;  . CORONARY ANGIOPLASTY  ?   PT UNABLE TO TELL IF  BEFORE OR AFTER  CABG  . CORONARY ARTERY BYPASS GRAFT  2008  ([place] , [place])   3 VESSEL  . DIALYSIS FISTULA CREATION  LAST SURGERY  APPOX  2008  . ENTEROSCOPY N/A 05/10/2016   Procedure: ENTEROSCOPY;  Surgeon: Jerene Bears, MD;  Location: [place];  Service: Gastroenterology;  Laterality: N/A;  . ENTEROSCOPY N/A 08/12/2016   Procedure: ENTEROSCOPY;  Surgeon: [name], MD;  Location: [facility] ENDOSCOPY;  Service: Endoscopy;  Laterality: N/A;  . ESOPHAGOGASTRODUODENOSCOPY N/A 05/07/2015   Procedure: ESOPHAGOGASTRODUODENOSCOPY (EGD);  Surgeon: Hulen Luster, MD;  Location: [facility] ENDOSCOPY;  Service: Endoscopy;  Laterality: N/A;  . ESOPHAGOGASTRODUODENOSCOPY (EGD) WITH PROPOFOL N/A 05/17/2015   Procedure: ESOPHAGOGASTRODUODENOSCOPY (EGD) WITH PROPOFOL;  Surgeon: [name], MD;  Location: [facility] ENDOSCOPY;  Service: Endoscopy;  Laterality: N/A;  . ESOPHAGOGASTRODUODENOSCOPY (EGD) WITH PROPOFOL N/A 01/20/2016   Procedure: ESOPHAGOGASTRODUODENOSCOPY (EGD) WITH PROPOFOL;  Surgeon: [name], MD;  Location: [facility] ENDOSCOPY;  Service: Endoscopy;  Laterality: N/A;  . ESOPHAGOGASTRODUODENOSCOPY (EGD) WITH PROPOFOL N/A 04/17/2016   Procedure: ESOPHAGOGASTRODUODENOSCOPY (EGD) WITH PROPOFOL;  Surgeon: Lin Landsman, MD;  Location: [facility] ENDOSCOPY;  Service: Gastroenterology;  Laterality: N/A;  . ESOPHAGOGASTRODUODENOSCOPY (EGD) WITH PROPOFOL  05/09/2016   Procedure: ESOPHAGOGASTRODUODENOSCOPY (EGD) WITH PROPOFOL;  Surgeon: Jerene Bears, MD;  Location: [place];  Service: Endoscopy;;  . ESOPHAGOGASTRODUODENOSCOPY (EGD) WITH PROPOFOL N/A 06/16/2016   Procedure: ESOPHAGOGASTRODUODENOSCOPY (EGD) WITH PROPOFOL;  Surgeon:  [name], MD;  Location: [facility] ENDOSCOPY;  Service: Endoscopy;  Laterality: N/A;  . ESOPHAGOGASTRODUODENOSCOPY (EGD) WITH PROPOFOL N/A 05/05/2017   Procedure: ESOPHAGOGASTRODUODENOSCOPY (EGD) WITH PROPOFOL;  Surgeon: Manya Silvas, MD;  Location: [facility] ENDOSCOPY;  Service: Endoscopy;  Laterality: N/A;  . GIVENS CAPSULE STUDY N/A 05/07/2016   Procedure: GIVENS CAPSULE STUDY;  Surgeon: Doran Stabler, MD;  Location: [place];  Service: Endoscopy;  Laterality: N/A;  . MANDIBULAR HARDWARE REMOVAL N/A 07/29/2013   Procedure: REMOVAL OF ARCH BARS;  Surgeon: Theodoro Kos, DO;  Location: [place];  Service: Plastics;  Laterality: N/A;  . ORIF MANDIBULAR FRACTURE N/A 06/05/2013   Procedure: REPAIR OF MANDIBULAR FRACTURE x 2 with maxillo-mandibular fixation ;  Surgeon: Theodoro Kos, DO;  Location: [place];  Service: Plastics;  Laterality: N/A;  . PERIPHERAL ARTERIAL STENT GRAFT Left     Prior to Admission medications   Medication Sig Start Date End Date Taking? Authorizing Provider  albuterol (PROVENTIL HFA;VENTOLIN HFA) 108 (90 Base) MCG/ACT inhaler Inhale 2 puffs into the lungs every 6 (six) hours as needed for wheezing or shortness of breath. 04/21/17   Lisa Roca, MD  alum & mag hydroxide-simeth (MAALOX/MYLANTA) 200-200-20 MG/5ML suspension Take 15 mLs  every 4 (four) hours as needed by mouth for indigestion or heartburn. Patient not taking: Reported on 04/19/2017 01/25/17   Demetrios Loll, MD  atorvastatin (LIPITOR) 40 MG tablet Take 1 tablet (40 mg total) by mouth daily. 11/16/16   Vaughan Basta, MD  budesonide-formoterol (SYMBICORT) 160-4.5 MCG/ACT inhaler Inhale 2 puffs into the lungs daily. 11/16/16   Vaughan Basta, MD  calcium acetate (PHOSLO) 667 MG capsule Take 2,001 mg by mouth 3 (three) times daily.    [provider]  ferrous sulfate 325 (65 FE) MG tablet Take 1 tablet by mouth 3 (three) times daily. 01/21/17   [provider]  furosemide  (LASIX) 80 MG tablet Take 1 tablet (80 mg total) daily by mouth. 01/25/17   Demetrios Loll, MD  gabapentin (NEURONTIN) 300 MG capsule Take 1 capsule by mouth daily as directed 08/16/16   [provider]  ipratropium-albuterol (DUONEB) 0.5-2.5 (3) MG/3ML SOLN Take 3 mLs by nebulization every 6 (six) hours as needed. 11/28/16   Loletha Grayer, MD  labetalol (NORMODYNE) 100 MG tablet Take 100 mg by mouth daily. 03/24/17   [provider]  Multiple Vitamins-Minerals-FA (DIALYVITE SUPREME D) 3 MG TABS Take 1 tablet (3 mg total) by mouth daily. 11/28/16   Loletha Grayer, MD  nitroGLYCERIN (NITROSTAT) 0.4 MG SL tablet Place 1 tablet (0.4 mg total) under the tongue every 5 (five) minutes as needed. 04/12/16   Wende Bushy, MD  pantoprazole (PROTONIX) 40 MG tablet Take 1 tablet (40 mg total) by mouth daily. 11/28/16   Loletha Grayer, MD  Spacer/Aero Chamber Mouthpiece MISC 1 Units by Does not apply route every 4 (four) hours as needed (wheezing). 02/19/17   Darel Hong, MD  tiotropium (SPIRIVA HANDIHALER) 18 MCG inhalation capsule Place 1 capsule (18 mcg total) into inhaler and inhale daily. 11/16/16 11/16/17  Vaughan Basta, MD    Family History  Problem Relation Age of Onset  . Colon cancer Mother   . Cancer Father   . Cancer Sister   . Kidney disease Brother      Social History   Tobacco Use  . Smoking status: Current Every Day Smoker    Packs/day: 0.15    Years: 40.00    Pack years: 6.00    Types: Cigarettes  . Smokeless tobacco: Never Used  Substance Use Topics  . Alcohol use: No    Comment: pt reports quitting after learning about cirrhosis  . Drug use: No    Frequency: 7.0 times per week    Types: Marijuana, Cocaine    Allergies as of 06/02/2017  . (No Known Allergies)    Review of Systems:    All systems reviewed and negative except where noted in HPI.   Physical Exam:  Vital signs in last 24 hours: Vitals:   06/02/17 2230 06/02/17 2243 06/02/17 2311  06/03/17 0516  BP: 140/73  134/77 117/75  Pulse: 88 92 87 81  Resp: 16 17 18 18   Temp: 99 F (37.2 C)  98.3 F (36.8 C) 98.2 F (36.8 C)  TempSrc: Oral  Oral Oral  SpO2:   100% 99%  Weight:      Height:         General:   Pleasant, cooperative in NAD Head:  Normocephalic and atraumatic. Eyes:   No icterus.   Conjunctiva pink. PERRLA. Ears:  Normal auditory acuity. Neck:  Supple; no masses or thyroidomegaly Lungs: Respirations even and unlabored. Lungs clear to auscultation bilaterally.   No wheezes, crackles, or rhonchi.  Heart:  Regular rate and rhythm;  Without murmur, clicks, rubs or gallops Abdomen:  Soft, distended, non-tense, nontender. Normal bowel sounds. No appreciable masses or hepatomegaly.  No rebound or guarding.  Neurologic:  Alert and oriented x3;  grossly normal neurologically. Skin:  Intact without significant lesions or rashes. Cervical Nodes:  No significant cervical adenopathy. Psych:  Alert and cooperative. Normal affect.  LAB RESULTS: Recent Labs    06/02/17 1352 06/02/17 1724 06/03/17 0006  WBC 7.0  --   --   HGB 6.4* 5.6* 7.2*  HCT 21.2*  --   --   PLT 251  --   --    BMET Recent Labs    06/02/17 1352  NA 136  K 6.4*  CL 100*  CO2 26  GLUCOSE 88  BUN 55*  CREATININE 7.59*  CALCIUM 8.5*   LFT Recent Labs    06/02/17 1352  PROT 6.8  ALBUMIN 2.7*  AST 38  ALT 37  ALKPHOS 172*  BILITOT 0.5  BILIDIR <0.1*  IBILI NOT CALCULATED   PT/INR No results for input(s): LABPROT, INR in the last 72 hours.  STUDIES: No results found.    Impression / Plan:   Stanley [name] is a 57 y.o. y/o male with history of alcoholic liver cirrhosis, history of AVMs status post ablation, history of repeated procedures as detailed in HPI,  ESRD on hemodialysis, repeated noncompliance presents with hematemesis, find to be hyperkalemic requiring dialysis yesterday  source of hematemesis possibly varices, grade 1 vari noted on last EGD by Dr.  Vira Agar.  No banding done patient denies drinking since 2 years Will await labs, to determine if patient is clinically optimized prior to upper endoscopy. Continue octreotide drip Continue Protonix twice daily AVMs is also possibility, given his prior history, but the amount of blood that he saw on his emesis is unlikely to be from AVMs itself. Continue serial CBCs and transfuse PRN Avoid NSAIDs If clinically not stable for upper endoscopy today, we will plan with medical optimization after 1-2 days   check iron and ferritin levels and start IV iron transfusions if low  Would recommend therapeutic and diagnostic paracentesis as well, with fluid studies, including fluid and serum albumin, fluid cytology  Recommend social work consult to improve compliance  We will continue to follow  Thank you for involving me in the care of this patient.      LOS: 1 day   Virgel Manifold, MD  06/03/2017, 7:48 AM

## 2017-06-03 NOTE — Op Note (Signed)
[facility] Gastroenterology Patient Name: Stanley [name] Procedure Date: 06/03/2017 9:40 AM MRN: [id number] Account #: [id number] Date of Birth: [birth year] Admit Type: Inpatient Age: 57 Room: [facility] ENDO ROOM 4 Gender: Male Note Status: Finalized Procedure:            Small bowel enteroscopy Indications:          Recurrent gastrointestinal bleeding Providers:            Varnita B. Bonna Gains MD, MD Referring MD:         Dyke Maes. Mancheno Revelo (Referring MD) Medicines:            Monitored Anesthesia Care Complications:        No immediate complications. Procedure:            Pre-Anesthesia Assessment:                       - Prior to the procedure, a History and Physical was                        performed, and patient medications, allergies and                        sensitivities were reviewed. The patient's tolerance of                        previous anesthesia was reviewed.                       - The risks and benefits of the procedure and the                        sedation options and risks were discussed with the                        patient. All questions were answered and informed                        consent was obtained.                       - Patient identification and proposed procedure were                        verified prior to the procedure by the physician, the                        nurse, the anesthesiologist, the anesthetist and the                        technician. The procedure was verified in the                        pre-procedure area in the procedure room in the                        endoscopy suite.                       - ASA Grade Assessment: IV - A patient with severe  systemic disease that is a constant threat to life.                       After obtaining informed consent, the endoscope was                        passed under direct vision. Throughout the procedure,                        the  patient's blood pressure, pulse, and oxygen                        saturations were monitored continuously. The                        Colonoscope was introduced through the mouth and                        advanced to the antrum of the stomach. The small bowel                        enteroscopy was accomplished with ease. The patient                        tolerated the procedure well. Findings:      LA Grade B (one or more mucosal breaks greater than 5 mm, not extending       between the tops of two mucosal folds) esophagitis with no bleeding was       found in the distal esophagus.      Grade I varices non bleeding, were found in the distal esophagus.      There is no endoscopic evidence of bleeding in the entire esophagus.      A large amount of food (residue) was found in the gastric fundus and in       the gastric antrum.      There is no endoscopic evidence of bleeding in the entire examined       stomach.      Due to presence of large amount of solid food in the antrum, covering       the pyloric channel, the endoscope could not be advanced past the       antrum. Due to patient repeatedly couging during the procedure, and due       to there not being any evidence of old or new blood in the esophagus or       stomach, it was considered best and safest to withdraw the endoscope       from the antrum and not try to proceed further into the pyloric channel.       No aspiration or vomiting occured during the procedure.      5-10 mins after sedation was stopped, endoscope was withdrawn and       procedure was completed, while patient still in the left lateral       position, patient began to vomit mild quantity of food like material.       Anesthesia staff was in the room at the time. No hypoxia or change in       vital signs occured. He was observed and no events of aspiration       occured, and patient awoke, and was alert  and oriented X 3 before being       sent back to the hospital  ward. Impression:           - LA Grade B reflux esophagitis. Possible source of                        patient's acute anemia                       - Grade I esophageal varices. With no stigmata or                        recent or active GI bleeding.                       - A large amount of food (residue) in the stomach.                       - No specimens collected. Recommendation:       - Return patient to hospital ward for ongoing care.                       - Continue PPI BID                       - Check Iron levels and initiate IV iron transfusion if                        indicated                       Ok to discontinue Octreotide drip                       - Avoid NSAIDs except Aspirin if medically indicated                       - Continue Serial CBCs and transfuse PRN                       - Follow an antireflux regimen.                       - Keep head of bed elevated to 30 degrees at all times.                       - Obtain Chest X-ray to rule out aspiration or                        pneumonia (due to mild episode of vomiting post                        procedure as detailed above)                       - See GI consult note for further recommendations                       If patient has continued anemia or active GI bleeding,  repeat procedure can be considered after maintaining                        NPO status for atleast 24 hrs.                       If active GI bleeding occurs prior to this timeframe,                        please obtain RBC scan.                       Possible repeat procedure on Monday depending on                        clinical course                       - Maintain NPO today until patient fully awake and                        conversive. Then if no signs of active GI bleeding,                        primary team can start full liquid diet. NPO after                        breakfast tomorrow (Sunday). Will evaluate for  need for                        repeat procedure on Monday.                       - Patient should follow up with [facility] to discuss small                        bowel enteroscopy due to his repeated episodes of                        bleeding in the past. See GI notes. Procedure Code(s):    --- Professional ---                       43 235, 52, Esophagogastroduodenoscopy, flexible,                        transoral; diagnostic, including collection of                        specimen(s) by brushing or washing, when performed                        (separate procedure) Diagnosis Code(s):    --- Professional ---                       K21.0, Gastro-esophageal reflux disease with esophagitis                       I85.00, Esophageal varices without bleeding                       K92.2, Gastrointestinal  hemorrhage, unspecified CPT copyright 2016 American Medical Association. All rights reserved. The codes documented in this report are preliminary and upon coder review may  be revised to meet current compliance requirements.  [name], MD Margretta Sidle B. Bonna Gains MD, MD 06/03/2017 10:46:45 AM This report has been signed electronically. Number of Addenda: 0 Note Initiated On: 06/03/2017 9:40 AM      [facility]

## 2017-06-03 NOTE — Anesthesia Postprocedure Evaluation (Signed)
Anesthesia Post Note  Patient: [name]  Procedure(s) Performed: ENTEROSCOPY (Left )  Patient location during evaluation: Endoscopy Anesthesia Type: General Level of consciousness: awake and alert Pain management: pain level controlled Vital Signs Assessment: post-procedure vital signs reviewed and stable Respiratory status: spontaneous breathing, nonlabored ventilation, respiratory function stable and patient connected to nasal cannula oxygen Cardiovascular status: blood pressure returned to baseline and stable Postop Assessment: no apparent nausea or vomiting Anesthetic complications: no Comments: Patient did have an episode of emesis post procedure with no evidence of aspiration.  He was still positioned on his left side when the emesis began and his head remained turned.  O2 saturation remained in the 90s throughout, but did drop initially to around 92.  After episode he returned to the high 90s and maintained that sat for the rest of the time he was in recovery.  Subjectively he stated that he felt a little short of breath, but there was no indication of that based on his vitals.  Plan to obtain a CXR to rule out aspiration though it would likely not initially show much this early after an event.  Will follow clinically.     Last Vitals:  Vitals:   06/03/17 1041 06/03/17 1124  BP: (!) 99/56 (!) 102/52  Pulse:  81  Resp:  12  Temp:  (!) 36.3 C  SpO2:  94%    Last Pain:  Vitals:   06/03/17 1213  TempSrc:   PainSc: 8                  Martha Clan

## 2017-06-03 NOTE — Anesthesia Preprocedure Evaluation (Signed)
Anesthesia Evaluation  Patient identified by MRN, date of birth, ID band Patient awake    Reviewed: Allergy & Precautions, H&P , NPO status , Patient's Chart, lab work & pertinent test results, reviewed documented beta blocker date and time   Airway Mallampati: III  TM Distance: >3 FB Neck ROM: full    Dental  (+) Dental Advidsory Given, Poor Dentition, Missing   Pulmonary neg pulmonary ROS, shortness of breath and with exertion, COPD,  COPD inhaler, neg recent URI, Current Smoker,           Cardiovascular Exercise Tolerance: Good hypertension, (-) angina+ CAD, + Past MI, + CABG, + Peripheral Vascular Disease and +CHF  (-) Cardiac Stents (-) dysrhythmias (-) Valvular Problems/Murmurs     Neuro/Psych PSYCHIATRIC DISORDERS negative neurological ROS     GI/Hepatic PUD, GERD  ,(+) Cirrhosis       , Hepatitis -  Endo/Other  diabetes  Renal/GU ESRF and DialysisRenal disease  negative genitourinary   Musculoskeletal   Abdominal   Peds  Hematology  (+) Blood dyscrasia, anemia ,   Anesthesia Other Findings Past Medical History: No date: Alcohol abuse No date: CHF (congestive heart failure) ([facility]) No date: Cirrhosis ([place]) 2009: Coronary artery disease No date: Diabetic peripheral neuropathy ([facility]) No date: Drug abuse ([place]) NEPHROLOGIST-   DR [facility]  IN [place]: End stage renal disease on  dialysis ([place])     Comment:  HEMODIALYSIS --   TUES/  THURS/  SAT No date: GERD (gastroesophageal reflux disease) No date: Hyperlipidemia No date: Hypertension No date: PAD (peripheral artery disease) ([facility]) No date: Renal insufficiency     Comment:  Per pt, 32 oz fluid restriction per day 06/09/2016: S/P triple vessel bypass     Comment:  2009ish No date: Suicidal ideation     Comment:  & HOMICIDAL IDEATION --  06-16-2013   ADMITTED TO               BEHAVIOR HEALTH   Reproductive/Obstetrics negative OB ROS                              Anesthesia Physical Anesthesia Plan  ASA: IV  Anesthesia Plan: General   Post-op Pain Management:    Induction: Intravenous  PONV Risk Score and Plan: 1 and Propofol infusion  Airway Management Planned: Nasal Cannula and Natural Airway  Additional Equipment:   Intra-op Plan:   Post-operative Plan:   Informed Consent: I have reviewed the patients History and Physical, chart, labs and discussed the procedure including the risks, benefits and alternatives for the proposed anesthesia with the patient or authorized representative who has indicated his/her understanding and acceptance.   Dental Advisory Given  Plan Discussed with: Anesthesiologist, CRNA and Surgeon  Anesthesia Plan Comments:         Anesthesia Quick Evaluation

## 2017-06-03 NOTE — Transfer of Care (Signed)
Immediate Anesthesia Transfer of Care Note  Patient: Stanley [name]  Procedure(s) Performed: ENTEROSCOPY (Left )  Patient Location: Endoscopy Unit  Anesthesia Type:General  Level of Consciousness: sedated  Airway & Oxygen Therapy: Patient Spontanous Breathing and Patient connected to nasal cannula oxygen  Post-op Assessment: Report given to RN and Post -op Vital signs reviewed and stable  Post vital signs: Reviewed and stable  Last Vitals:  Vitals Value Taken Time  BP    Temp    Pulse    Resp    SpO2      Last Pain:  Vitals:   06/03/17 0632  TempSrc:   PainSc: 5          Complications: No apparent anesthesia complications

## 2017-06-03 NOTE — Progress Notes (Signed)
MD aware of low O2 saturations. O2 applied, New orders given for CTA.

## 2017-06-03 NOTE — Progress Notes (Signed)
Pharmacy Antibiotic Note  Stanley [name] is a 57 y.o. male admitted on 06/02/2017 with GIB.  Pharmacy has been consulted for ceftriaxone dosing.  Plan: Ceftriaxone 1 g IV q24h  Height: 6\' 3"  (190.5 cm) Weight: 155 lb 14.4 oz (70.7 kg) IBW/kg (Calculated) : 84.5  Temp (24hrs), Avg:98.5 F (36.9 C), Min:98.1 F (36.7 C), Max:99 F (37.2 C)  Recent Labs  Lab 05/29/17 1616 06/02/17 1352 06/03/17 0733  WBC  --  7.0  --   CREATININE 9.56* 7.59* 4.75*    Estimated Creatinine Clearance: 17 mL/min (A) (by C-G formula based on SCr of 4.75 mg/dL (H)).    No Known Allergies  Antimicrobials this admission: Ceftriaxone 3/23 >>  Dose adjustments this admission:   Microbiology results: 3/22 MRSA PCR: neg  Thank you for allowing pharmacy to be a part of this patient's care.  Rocky Morel 06/03/2017 8:48 AM

## 2017-06-03 NOTE — Progress Notes (Addendum)
[facility] NAME: Stanley [name]    MR#:  [id number]  DATE OF BIRTH:  28-Sep-1960  SUBJECTIVE:   Patient presented with hematemesis EGD performed this morning.  REVIEW OF SYSTEMS:    Review of Systems  Constitutional: Negative for fever, chills weight loss HENT: Negative for ear pain, nosebleeds, congestion, facial swelling, rhinorrhea, neck pain, neck stiffness and ear discharge.   Respiratory: Negative for cough, shortness of breath, wheezing  Cardiovascular: Negative for chest pain, palpitations and leg swelling.  Gastrointestinal: Negative for heartburn, abdominal pain, vomiting, diarrhea or consitpation Genitourinary: Negative for dysuria, urgency, frequency, hematuria Musculoskeletal: Negative for back pain or joint pain Neurological: Negative for dizziness, seizures, syncope, focal weakness,  numbness and headaches.  Hematological: Does not bruise/bleed easily.  Psychiatric/Behavioral: Negative for hallucinations, confusion, dysphoric mood    Tolerating Diet: npo      DRUG ALLERGIES:  No Known Allergies  VITALS:  Blood pressure (!) 99/56, pulse 91, temperature 97.6 F (36.4 C), temperature source Tympanic, resp. rate 18, height 6\' 3"  (1.905 m), weight 70.7 kg (155 lb 14.4 oz), SpO2 99 %.  PHYSICAL EXAMINATION:  Constitutional: Appears well-developed and well-nourished. No distress. HENT: Normocephalic. Marland Kitchen Oropharynx is clear and moist.  Eyes: Conjunctivae and EOM are normal. PERRLA, no scleral icterus.  Neck: Normal ROM. Neck supple. No JVD. No tracheal deviation. CVS: RRR, S1/S2 +, 3/6 HSM, no gallops, no carotid bruit.  Pulmonary: Effort and breath sounds normal, no stridor, rhonchi, wheezes, rales.  Abdominal: Slight distention without rebound or guarding  musculoskeletal: Normal range of motion. No edema and no tenderness.  Neuro: Alert. CN 2-12 grossly intact. No focal deficits. Skin: Skin is warm and dry.  No rash noted. Psychiatric: Normal mood and affect.      LABORATORY PANEL:   CBC Recent Labs  Lab 06/02/17 1352  06/03/17 0733  WBC 7.0  --   --   HGB 6.4*   < > 8.1*  HCT 21.2*  --   --   PLT 251  --   --    < > = values in this interval not displayed.   ------------------------------------------------------------------------------------------------------------------  Chemistries  Recent Labs  Lab 06/02/17 1352 06/03/17 0733  NA 136 141  K 6.4* 5.2*  CL 100* 103  CO2 26 29  GLUCOSE 88 97  BUN 55* 34*  CREATININE 7.59* 4.75*  CALCIUM 8.5* 8.0*  AST 38  --   ALT 37  --   ALKPHOS 172*  --   BILITOT 0.5  --    ------------------------------------------------------------------------------------------------------------------  Cardiac Enzymes No results for input(s): TROPONINI in the last 168 hours. ------------------------------------------------------------------------------------------------------------------  RADIOLOGY:  No results found.   ASSESSMENT AND PLAN:   57 year old male with end-stage renal disease on hemodialysis, alcoholic cirrhosis, severe pulmonary hypertension with moderate to severe MR, chronic diastolic heart failure with preserved ejection fraction and CAD who presents with hematemesis  1.  Upper GI bleed: Endoscopy shows LA grade 2 reflux esophagitis and grade 1 esophageal varices with no recent or active GI bleeding Recommendations to continue PPI and discontinue octreotide Avoid NSAIDs except aspirin Was started Rocephin in the setting of ascites and GI bleed   2.  Acute on chronic blood loss anemia due to LAD grade 2 esophagitis Patient is status post 2 unit PRBCs Hemoglobin remained stable Follow-up on iron panel If iron levels are low then patient will need to be initiated on IV iron Continue Epogen with dialysis  3.  [name] with hyperkalemia: Patient underwent emergent dialysis  yesterday Normal schedule of dialysis Monday, Wednesday and Friday   4.  Chronic diastolic heart failure with preserved ejection fraction without signs of exacerbation Continue Lasix No ACEI/ARB given ESRD as per his cardiologist  5.  CAD/CABG: Continue atorvastatin and Asa ok to start as per GI   6.  Essential hypertension: Continue labetalol   7. Severe MR/pulmonary HTM: Follows with Cardiology Cardiology is ConsideringMetoprolol in place of labetolol  8. Liver cirrhosis: GI is recommending paracentesis.   Management plans discussed with the patient and he is in agreement.  CODE STATUS: FULL  TOTAL TIME TAKING CARE OF THIS PATIENT: 30 minutes.     POSSIBLE D/C tomorrow, DEPENDING ON CLINICAL CONDITION.   Alejandrina Raimer M.D on 06/03/2017 at 11:20 AM  Between 7am to 6pm - Pager - [phone] After 6pm go to [email or web address] - password [email or web address] [facility]  Office  [phone]  CC: Primary care physician; [name], MD  Note: This dictation was prepared with Dragon dictation along with smaller phrase technology. Any transcriptional errors that result from this process are unintentional.

## 2017-06-03 NOTE — Progress Notes (Signed)
Hypoglycemic Event  CBG: 67  Treatment: 1/2 cup of ginger ale.  Symptoms: None Follow-up CBG: Time: CBG Result:  Possible Reasons for Event: Pt has had poor PO intake today. NPO this morning for a procedure.     Stanley [name]

## 2017-06-03 NOTE — Progress Notes (Signed)
Chest Xray report reviewed and reports no active disease.

## 2017-06-03 NOTE — Anesthesia Post-op Follow-up Note (Signed)
Anesthesia QCDR form completed.        

## 2017-06-04 DIAGNOSIS — F17210 Nicotine dependence, cigarettes, uncomplicated: Secondary | ICD-10-CM

## 2017-06-04 DIAGNOSIS — N186 End stage renal disease: Secondary | ICD-10-CM

## 2017-06-04 DIAGNOSIS — Z841 Family history of disorders of kidney and ureter: Secondary | ICD-10-CM

## 2017-06-04 DIAGNOSIS — K7460 Unspecified cirrhosis of liver: Secondary | ICD-10-CM

## 2017-06-04 DIAGNOSIS — Y828 Other medical devices associated with adverse incidents: Secondary | ICD-10-CM

## 2017-06-04 DIAGNOSIS — J811 Chronic pulmonary edema: Secondary | ICD-10-CM

## 2017-06-04 DIAGNOSIS — Z9119 Patient's noncompliance with other medical treatment and regimen: Secondary | ICD-10-CM

## 2017-06-04 DIAGNOSIS — T82838A Hemorrhage of vascular prosthetic devices, implants and grafts, initial encounter: Secondary | ICD-10-CM

## 2017-06-04 LAB — BASIC METABOLIC PANEL
ANION GAP: 12 (ref 5–15)
BUN: 49 mg/dL — ABNORMAL HIGH (ref 6–20)
CALCIUM: 7.5 mg/dL — AB (ref 8.9–10.3)
CO2: 25 mmol/L (ref 22–32)
Chloride: 101 mmol/L (ref 101–111)
Creatinine, Ser: 6.46 mg/dL — ABNORMAL HIGH (ref 0.61–1.24)
GFR calc Af Amer: 10 mL/min — ABNORMAL LOW (ref >60)
GFR calc non Af Amer: 8 mL/min — ABNORMAL LOW (ref >60)
GLUCOSE: 93 mg/dL (ref 65–99)
POTASSIUM: 5.3 mmol/L — AB (ref 3.5–5.1)
Sodium: 138 mmol/L (ref 135–145)

## 2017-06-04 LAB — CBC
HEMATOCRIT: 26.7 % — AB (ref 40.0–52.0)
Hemoglobin: 8.4 g/dL — ABNORMAL LOW (ref 13.0–18.0)
MCH: 27.6 pg (ref 26.0–34.0)
MCHC: 31.4 g/dL — ABNORMAL LOW (ref 32.0–36.0)
MCV: 87.8 fL (ref 80.0–100.0)
Platelets: 219 10*3/uL (ref 150–440)
RBC: 3.04 MIL/uL — ABNORMAL LOW (ref 4.40–5.90)
RDW: 17.6 % — AB (ref 11.5–14.5)
WBC: 13.1 10*3/uL — AB (ref 3.8–10.6)

## 2017-06-04 LAB — GLUCOSE, CAPILLARY
GLUCOSE-CAPILLARY: 81 mg/dL (ref 65–99)
GLUCOSE-CAPILLARY: 95 mg/dL (ref 65–99)
Glucose-Capillary: 81 mg/dL (ref 65–99)
Glucose-Capillary: 75 mg/dL (ref 65–99)
Glucose-Capillary: 86 mg/dL (ref 65–99)

## 2017-06-04 LAB — HEMOGLOBIN
HEMOGLOBIN: 7.8 g/dL — AB (ref 13.0–18.0)
Hemoglobin: 7.4 g/dL — ABNORMAL LOW (ref 13.0–18.0)

## 2017-06-04 LAB — VITAMIN B12: Vitamin B-12: 498 pg/mL (ref 180–914)

## 2017-06-04 MED ORDER — RENA-VITE PO TABS
1.0000 | ORAL_TABLET | Freq: Every day | ORAL | Status: DC
  Administered 2017-06-04 – 2017-06-05 (×2): 1 via ORAL
  Filled 2017-06-04 (×2): qty 1

## 2017-06-04 MED ORDER — VITAMIN C 500 MG PO TABS
500.0000 mg | ORAL_TABLET | Freq: Every day | ORAL | Status: DC
  Administered 2017-06-05: 500 mg via ORAL
  Filled 2017-06-04 (×2): qty 1

## 2017-06-04 MED ORDER — FUROSEMIDE 10 MG/ML IJ SOLN: 20.0000 mg | Freq: Once | INTRAMUSCULAR | Status: DC

## 2017-06-04 MED ORDER — NEPRO/CARBSTEADY PO LIQD
237.0000 mL | Freq: Three times a day (TID) | ORAL | Status: DC
  Administered 2017-06-04 – 2017-06-05 (×2): 237 mL via ORAL

## 2017-06-04 MED ORDER — LIDOCAINE HCL (PF) 1 % IJ SOLN
5.0000 mL | Freq: Once | INTRAMUSCULAR | Status: AC
  Administered 2017-06-04: 5 mL via INTRADERMAL
  Filled 2017-06-04: qty 5

## 2017-06-04 NOTE — Progress Notes (Signed)
Post Hd assessment    06/04/17 1700  Neurological  Level of Consciousness Alert  Orientation Level Oriented X4  Respiratory  Respiratory Pattern Regular;Unlabored;Symmetrical  Chest Assessment Chest expansion symmetrical  Bilateral Breath Sounds Fine crackles  R Lower Breath Sounds Diminished  L Lower Breath Sounds Diminished  Cardiac  Pulse Irregular  Antiarrhythmic device No  Psychosocial  Psychosocial (WDL) WDL

## 2017-06-04 NOTE — Consult Note (Signed)
Reason for Consult: ESRD on HD- malfunctioning right Brachiocephalic AV fistula Referring Physician: Dr. Wanita Chamberlain Peng [name] is an 57 y.o. male.  HPI: Stanley [name] known to service. ESRD, Cirrhosis- admitted for esophageal variceal bleeding. Right AV fistula with history of malfunctioning in the past. Now with malfunctioning AV fistula. Noted to have loss of thrill and bruit today. Has shortness of breath and pulmonary edema. He was scheduled for an outpatient AV fistulogram for fistula issues in the last few weeks but has been noncompliant.  Past Medical History:  Diagnosis Date  . Alcohol abuse   . CHF (congestive heart failure) ([place])   . Cirrhosis ([place])   . Coronary artery disease 2009  . Diabetic peripheral neuropathy ([place])   . Drug abuse ([place])   . End stage renal disease on dialysis [facility]) NEPHROLOGIST-   DR [address] [facility]  IN [place]   HEMODIALYSIS --   TUES/  THURS/  SAT  . GERD (gastroesophageal reflux disease)   . Hyperlipidemia   . Hypertension   . PAD (peripheral artery disease) ([place])   . Renal insufficiency    Per pt, 32 oz fluid restriction per day  . S/P triple vessel bypass 06/09/2016   2009ish  . Suicidal ideation    & HOMICIDAL IDEATION --  06-16-2013   ADMITTED TO BEHAVIOR HEALTH    Past Surgical History:  Procedure Laterality Date  . AGILE CAPSULE N/A 06/19/2016   Procedure: AGILE CAPSULE;  Surgeon: [name], MD;  Location: [facility] ENDOSCOPY;  Service: Endoscopy;  Laterality: N/A;  . COLONOSCOPY WITH PROPOFOL N/A 06/18/2016   Procedure: COLONOSCOPY WITH PROPOFOL;  Surgeon: [name], MD;  Location: [facility] ENDOSCOPY;  Service: Endoscopy;  Laterality: N/A;  . COLONOSCOPY WITH PROPOFOL N/A 08/12/2016   Procedure: COLONOSCOPY WITH PROPOFOL;  Surgeon: [name], MD;  Location: [facility] ENDOSCOPY;  Service: Endoscopy;  Laterality: N/A;  . COLONOSCOPY WITH PROPOFOL N/A 05/05/2017   Procedure: COLONOSCOPY WITH PROPOFOL;  Surgeon: Manya Silvas, MD;  Location: [facility] ENDOSCOPY;   Service: Endoscopy;  Laterality: N/A;  . CORONARY ANGIOPLASTY  ?   PT UNABLE TO TELL IF  BEFORE OR AFTER  CABG  . CORONARY ARTERY BYPASS GRAFT  2008  ([place] , [place])   3 VESSEL  . DIALYSIS FISTULA CREATION  LAST SURGERY  APPOX  2008  . ENTEROSCOPY N/A 05/10/2016   Procedure: ENTEROSCOPY;  Surgeon: Jerene Bears, MD;  Location: [place];  Service: Gastroenterology;  Laterality: N/A;  . ENTEROSCOPY N/A 08/12/2016   Procedure: ENTEROSCOPY;  Surgeon: [name], MD;  Location: [facility] ENDOSCOPY;  Service: Endoscopy;  Laterality: N/A;  . ESOPHAGOGASTRODUODENOSCOPY N/A 05/07/2015   Procedure: ESOPHAGOGASTRODUODENOSCOPY (EGD);  Surgeon: Hulen Luster, MD;  Location: [facility] ENDOSCOPY;  Service: Endoscopy;  Laterality: N/A;  . ESOPHAGOGASTRODUODENOSCOPY (EGD) WITH PROPOFOL N/A 05/17/2015   Procedure: ESOPHAGOGASTRODUODENOSCOPY (EGD) WITH PROPOFOL;  Surgeon: [name], MD;  Location: [facility] ENDOSCOPY;  Service: Endoscopy;  Laterality: N/A;  . ESOPHAGOGASTRODUODENOSCOPY (EGD) WITH PROPOFOL N/A 01/20/2016   Procedure: ESOPHAGOGASTRODUODENOSCOPY (EGD) WITH PROPOFOL;  Surgeon: [name], MD;  Location: [facility] ENDOSCOPY;  Service: Endoscopy;  Laterality: N/A;  . ESOPHAGOGASTRODUODENOSCOPY (EGD) WITH PROPOFOL N/A 04/17/2016   Procedure: ESOPHAGOGASTRODUODENOSCOPY (EGD) WITH PROPOFOL;  Surgeon: Lin Landsman, MD;  Location: [facility] ENDOSCOPY;  Service: Gastroenterology;  Laterality: N/A;  . ESOPHAGOGASTRODUODENOSCOPY (EGD) WITH PROPOFOL  05/09/2016   Procedure: ESOPHAGOGASTRODUODENOSCOPY (EGD) WITH PROPOFOL;  Surgeon: Jerene Bears, MD;  Location: [facility] ENDOSCOPY;  Service: Endoscopy;;  . ESOPHAGOGASTRODUODENOSCOPY (EGD) WITH PROPOFOL N/A 06/16/2016  Procedure: ESOPHAGOGASTRODUODENOSCOPY (EGD) WITH PROPOFOL;  Surgeon: [name], MD;  Location: [facility] ENDOSCOPY;  Service: Endoscopy;  Laterality: N/A;  . ESOPHAGOGASTRODUODENOSCOPY (EGD) WITH PROPOFOL N/A 05/05/2017   Procedure: ESOPHAGOGASTRODUODENOSCOPY (EGD) WITH PROPOFOL;   Surgeon: Manya Silvas, MD;  Location: [facility] ENDOSCOPY;  Service: Endoscopy;  Laterality: N/A;  . GIVENS CAPSULE STUDY N/A 05/07/2016   Procedure: GIVENS CAPSULE STUDY;  Surgeon: Doran Stabler, MD;  Location: [place];  Service: Endoscopy;  Laterality: N/A;  . MANDIBULAR HARDWARE REMOVAL N/A 07/29/2013   Procedure: REMOVAL OF ARCH BARS;  Surgeon: Theodoro Kos, DO;  Location: [place];  Service: Plastics;  Laterality: N/A;  . ORIF MANDIBULAR FRACTURE N/A 06/05/2013   Procedure: REPAIR OF MANDIBULAR FRACTURE x 2 with maxillo-mandibular fixation ;  Surgeon: Theodoro Kos, DO;  Location: [place];  Service: Plastics;  Laterality: N/A;  . PERIPHERAL ARTERIAL STENT GRAFT Left     Family History  Problem Relation Age of Onset  . Colon cancer Mother   . Cancer Father   . Cancer Sister   . Kidney disease Brother     Social History:  reports that he has been smoking cigarettes.  He has a 6.00 pack-year smoking history. He has never used smokeless tobacco. He reports that he does not drink alcohol or use drugs.  Allergies: No Known Allergies  Medications: I have reviewed the Stanley [name]'s current medications.  Results for orders placed or performed during the hospital encounter of 06/02/17 (from the past 48 hour(s))  Basic metabolic panel     Status: Abnormal   Collection Time: 06/02/17  1:52 PM  Result Value Ref Range   Sodium 136 135 - 145 mmol/L   Potassium 6.4 (HH) 3.5 - 5.1 mmol/L    Comment: CRITICAL RESULT CALLED TO, READ BACK BY AND VERIFIED WITH ANGELA ROBBINS 06/02/17 @ 1449  MLK    Chloride 100 (L) 101 - 111 mmol/L   CO2 26 22 - 32 mmol/L   Glucose, Bld 88 65 - 99 mg/dL   BUN 55 (H) 6 - 20 mg/dL   Creatinine, Ser 7.59 (H) 0.61 - 1.24 mg/dL   Calcium 8.5 (L) 8.9 - 10.3 mg/dL   GFR calc non Af Amer 7 (L) >60 mL/min   GFR calc Af Amer 8 (L) >60 mL/min    Comment: (NOTE) The eGFR has been calculated using the CKD EPI equation. This calculation has not been  validated in all clinical situations. eGFR's persistently <60 mL/min signify possible Chronic Kidney Disease.    Anion gap 10 5 - 15    Comment: Performed at [facility], [address] [place], [place] [address]  CBC     Status: Abnormal   Collection Time: 06/02/17  1:52 PM  Result Value Ref Range   WBC 7.0 3.8 - 10.6 K/uL   RBC 2.41 (L) 4.40 - 5.90 MIL/uL   Hemoglobin 6.4 (L) 13.0 - 18.0 g/dL   HCT 21.2 (L) 40.0 - 52.0 %   MCV 87.9 80.0 - 100.0 fL   MCH 26.6 26.0 - 34.0 pg   MCHC 30.3 (L) 32.0 - 36.0 g/dL   RDW 18.7 (H) 11.5 - 14.5 %   Platelets 251 150 - 440 K/uL    Comment: Performed at [facility], [address] [place], [place] [address]  Hepatic function panel     Status: Abnormal   Collection Time: 06/02/17  1:52 PM  Result Value Ref Range   Total Protein 6.8 6.5 -  8.1 g/dL   Albumin 2.7 (L) 3.5 - 5.0 g/dL   AST 38 15 - 41 U/L   ALT 37 17 - 63 U/L   Alkaline Phosphatase 172 (H) 38 - 126 U/L   Total Bilirubin 0.5 0.3 - 1.2 mg/dL   Bilirubin, Direct <0.1 (L) 0.1 - 0.5 mg/dL   Indirect Bilirubin NOT CALCULATED 0.3 - 0.9 mg/dL    Comment: Performed at [facility], [address] [place], [place] [address]  Type and screen [place]     Status: None   Collection Time: 06/02/17  1:53 PM  Result Value Ref Range   ABO/RH(D) A POS    Antibody Screen NEG    Sample Expiration 06/05/2017    Unit Number [id number]    Blood Component Type RED CELLS,LR    Unit division 00    Status of Unit ISSUED,FINAL    Transfusion Status OK TO TRANSFUSE    Crossmatch Result      Compatible Performed at [facility], [address], [place], [place] [address]    Unit Number [id number]    Blood Component Type RED CELLS,LR    Unit division 00    Status of Unit ISSUED,FINAL    Transfusion Status OK TO TRANSFUSE    Crossmatch Result Compatible   Prepare RBC     Status: None   Collection Time: 06/02/17  2:48 PM   Result Value Ref Range   Order Confirmation      ORDER PROCESSED BY BLOOD BANK Performed at [facility], [address] [place], [place] [address]   MRSA PCR Screening     Status: None   Collection Time: 06/02/17  4:22 PM  Result Value Ref Range   MRSA by PCR NEGATIVE NEGATIVE    Comment:        The GeneXpert MRSA Assay (FDA approved for NASAL specimens only), is one component of a comprehensive MRSA colonization surveillance program. It is not intended to diagnose MRSA infection nor to guide or monitor treatment for MRSA infections. Performed at [facility], [address] [place], [place] [address]   Hemoglobin     Status: Abnormal   Collection Time: 06/02/17  5:24 PM  Result Value Ref Range   Hemoglobin 5.6 (L) 13.0 - 18.0 g/dL    Comment: Performed at [facility], [address] [place], [place] [address]  Glucose, capillary     Status: None   Collection Time: 06/02/17  5:35 PM  Result Value Ref Range   Glucose-Capillary 91 65 - 99 mg/dL  Glucose, capillary     Status: Abnormal   Collection Time: 06/02/17 11:12 PM  Result Value Ref Range   Glucose-Capillary 126 (H) 65 - 99 mg/dL   Comment 1 Notify RN   Hemoglobin     Status: Abnormal   Collection Time: 06/03/17 12:06 AM  Result Value Ref Range   Hemoglobin 7.2 (L) 13.0 - 18.0 g/dL    Comment: RESULT REPEATED AND VERIFIED Performed at [facility], [address] [place], [place] [address]   Glucose, capillary     Status: Abnormal   Collection Time: 06/03/17  6:04 AM  Result Value Ref Range   Glucose-Capillary 100 (H) 65 - 99 mg/dL  Basic metabolic panel     Status: Abnormal   Collection Time: 06/03/17  7:33 AM  Result Value Ref Range   Sodium 141 135 - 145 mmol/L   Potassium 5.2 (H) 3.5 - 5.1  mmol/L   Chloride 103 101 - 111 mmol/L   CO2 29 22 - 32 mmol/L   Glucose, Bld 97 65 - 99 mg/dL   BUN 34 (H) 6 - 20 mg/dL   Creatinine, Ser 4.75 (H) 0.61 - 1.24 mg/dL    Calcium 8.0 (L) 8.9 - 10.3 mg/dL   GFR calc non Af Amer 12 (L) >60 mL/min   GFR calc Af Amer 14 (L) >60 mL/min    Comment: (NOTE) The eGFR has been calculated using the CKD EPI equation. This calculation has not been validated in all clinical situations. eGFR's persistently <60 mL/min signify possible Chronic Kidney Disease.    Anion gap 9 5 - 15    Comment: Performed at [facility], [address] [place], [place] [address]  Hemoglobin     Status: Abnormal   Collection Time: 06/03/17  7:33 AM  Result Value Ref Range   Hemoglobin 8.1 (L) 13.0 - 18.0 g/dL    Comment: Performed at [facility], [address] [place], [place] [address]  Glucose, capillary     Status: None   Collection Time: 06/03/17 11:52 AM  Result Value Ref Range   Glucose-Capillary 82 65 - 99 mg/dL  Hemoglobin     Status: Abnormal   Collection Time: 06/03/17  2:40 PM  Result Value Ref Range   Hemoglobin 11.1 (L) 13.0 - 18.0 g/dL    Comment: Performed at [facility], [address] [place], [place] [address]  Vitamin B12     Status: None   Collection Time: 06/03/17  2:40 PM  Result Value Ref Range   Vitamin B-12 498 180 - 914 pg/mL    Comment: (NOTE) This assay is not validated for testing neonatal or myeloproliferative syndrome specimens for Vitamin B12 levels. Performed at [facility], [place] [address] [place], [place] [address]   Folate     Status: None   Collection Time: 06/03/17  2:40 PM  Result Value Ref Range   Folate 16.7 >5.9 ng/mL    Comment: Performed at [facility], [address] [place], [place] [address]  Iron and TIBC     Status: Abnormal   Collection Time: 06/03/17  2:40 PM  Result Value Ref Range   Iron 55 45 - 182 ug/dL   TIBC 332 250 - 450 ug/dL   Saturation Ratios 17 (L) 17.9 - 39.5 %   UIBC 277 ug/dL    Comment: Performed at [facility], [address] [place], [place] [address]  Ferritin     Status: None    Collection Time: 06/03/17  2:40 PM  Result Value Ref Range   Ferritin 53 24 - 336 ng/mL    Comment: Performed at [facility], [address] [place], [place] [address]  Reticulocytes     Status: Abnormal   Collection Time: 06/03/17  2:40 PM  Result Value Ref Range   Retic Ct Pct 4.1 (H) 0.4 - 3.1 %   RBC. 3.96 (L) 4.40 - 5.90 MIL/uL   Retic Count, Absolute 162.4 19.0 - 183.0 K/uL    Comment: Performed at [facility], [address] [place], [place] [address]  Glucose, capillary     Status: None   Collection Time: 06/03/17  6:00 PM  Result Value Ref Range   Glucose-Capillary 67 65 - 99 mg/dL  Glucose, capillary     Status: None   Collection Time: 06/03/17  6:27 PM  Result Value Ref Range  Glucose-Capillary 78 65 - 99 mg/dL  Hemoglobin     Status: Abnormal   Collection Time: 06/03/17  9:04 PM  Result Value Ref Range   Hemoglobin 9.4 (L) 13.0 - 18.0 g/dL    Comment: Performed at [facility], [address] [place], [place] [address]  Glucose, capillary     Status: None   Collection Time: 06/03/17 10:42 PM  Result Value Ref Range   Glucose-Capillary 76 65 - 99 mg/dL   Comment 1 Notify RN   Glucose, capillary     Status: None   Collection Time: 06/03/17 11:45 PM  Result Value Ref Range   Glucose-Capillary 69 65 - 99 mg/dL   Comment 1 Notify RN   Glucose, capillary     Status: None   Collection Time: 06/04/17  3:17 AM  Result Value Ref Range   Glucose-Capillary 81 65 - 99 mg/dL   Comment 1 Notify RN   Glucose, capillary     Status: None   Collection Time: 06/04/17  4:50 AM  Result Value Ref Range   Glucose-Capillary 75 65 - 99 mg/dL   Comment 1 Notify RN   CBC     Status: Abnormal   Collection Time: 06/04/17  5:28 AM  Result Value Ref Range   WBC 13.1 (H) 3.8 - 10.6 K/uL   RBC 3.04 (L) 4.40 - 5.90 MIL/uL   Hemoglobin 8.4 (L) 13.0 - 18.0 g/dL   HCT 26.7 (L) 40.0 - 52.0 %   MCV 87.8 80.0 - 100.0 fL   MCH 27.6 26.0 - 34.0 pg   MCHC 31.4  (L) 32.0 - 36.0 g/dL   RDW 17.6 (H) 11.5 - 14.5 %   Platelets 219 150 - 440 K/uL    Comment: Performed at [facility], [address] [place], [place] [address]  Basic metabolic panel     Status: Abnormal   Collection Time: 06/04/17  5:28 AM  Result Value Ref Range   Sodium 138 135 - 145 mmol/L   Potassium 5.3 (H) 3.5 - 5.1 mmol/L   Chloride 101 101 - 111 mmol/L   CO2 25 22 - 32 mmol/L   Glucose, Bld 93 65 - 99 mg/dL   BUN 49 (H) 6 - 20 mg/dL   Creatinine, Ser 6.46 (H) 0.61 - 1.24 mg/dL   Calcium 7.5 (L) 8.9 - 10.3 mg/dL   GFR calc non Af Amer 8 (L) >60 mL/min   GFR calc Af Amer 10 (L) >60 mL/min    Comment: (NOTE) The eGFR has been calculated using the CKD EPI equation. This calculation has not been validated in all clinical situations. eGFR's persistently <60 mL/min signify possible Chronic Kidney Disease.    Anion gap 12 5 - 15    Comment: Performed at [facility], [address] [place], [place] [address]  Glucose, capillary     Status: None   Collection Time: 06/04/17 12:00 PM  Result Value Ref Range   Glucose-Capillary 86 65 - 99 mg/dL    Ct Angio Chest Pe W Or Wo Contrast  Result Date: 06/03/2017 CLINICAL DATA:  Hypoxia EXAM: CT ANGIOGRAPHY CHEST WITH CONTRAST TECHNIQUE: Multidetector CT imaging of the chest was performed using the standard protocol during bolus administration of intravenous contrast. Multiplanar CT image reconstructions and MIPs were obtained to evaluate the vascular anatomy. CONTRAST:  44m ISOVUE-370 IOPAMIDOL (ISOVUE-370) INJECTION 76% COMPARISON:  Plain film from 06/03/2017, CT from 03/20/16 FINDINGS: Cardiovascular: Thoracic aorta and its branches demonstrate atherosclerotic calcifications  without aneurysmal dilatation or dissection. No significant cardiac enlargement is noted. Heavy coronary calcifications are seen. The pulmonary artery is well visualized with a normal branching pattern. No filling defects to suggest pulmonary  emboli are identified. Mediastinum/Nodes: Thoracic inlet is within normal limits. No significant hilar or mediastinal adenopathy is identified. The esophagus is within normal limits. Lungs/Pleura: Emphysematous changes are noted in the lungs bilaterally. The right lung is clear with the exception of some alveolar infiltrate in the lower lobe. More marked alveolar infiltrate is noted in the left lower lobe with areas of consolidation. These changes are also noted but to a slightly lesser degree in the left upper lobe. These have increased in the interval from the prior plain film examination today. Upper Abdomen: Visualized upper abdomen demonstrates cirrhotic change of the liver with moderate ascites. No other focal abnormality in the upper abdomen is seen. No evidence of varices are noted. Musculoskeletal: Degenerative changes of the thoracic spine are noted. Old right rib fractures are noted. Review of the MIP images confirms the above findings. IMPRESSION: Diffuse left-sided infiltrate slightly greater in the lower lobe than the upper lobe with minimal infiltrative changes in the right lower lobe. Given the abrupt change from the recent plain film this likely represents some asymmetric pulmonary edema. Clinical correlation is recommended. Cirrhotic change with evidence of moderate ascites. No evidence of pulmonary embolism. Aortic Atherosclerosis (ICD10-I70.0) and Emphysema (ICD10-J43.9). Electronically Signed   By: Inez Catalina M.D.   On: 06/03/2017 17:45   Dg Chest Port 1 View  Result Date: 06/03/2017 CLINICAL DATA:  Vomiting. EXAM: PORTABLE CHEST 1 VIEW COMPARISON:  May 17, 2017 FINDINGS: Cardiomegaly, stable. The hila and mediastinum are unchanged. No pneumothorax. No pulmonary nodules or masses. No focal infiltrates. IMPRESSION: No active disease. Electronically Signed   By: Dorise Bullion III M.D   On: 06/03/2017 11:40    Review of Systems  Constitutional: Positive for malaise/fatigue.   Respiratory: Positive for shortness of breath.   Gastrointestinal: Positive for vomiting.       Hematemesis   Blood pressure (!) 111/59, pulse 94, temperature 98.1 F (36.7 C), temperature source Oral, resp. rate 20, height 6' 3" (1.905 m), weight 70.7 kg (155 lb 14.4 oz), SpO2 98 %. Physical Exam  Nursing note and vitals reviewed. Constitutional: He is oriented to person, place, and time. He appears well-developed.  Cardiovascular: Normal rate and regular rhythm.  Respiratory: Effort normal. He has rales.  GI: Soft.  Ascites, distended  Musculoskeletal: He exhibits edema.  Neurological: He is alert and oriented to person, place, and time.  Skin: Skin is warm.    Assessment/Plan: ESRD. Malfunctioning right AV fistula-brachiocephalic. Will place temporary HD catheter today. Plan for Fistulogram and declot tomorrow or Tuesday per Dr. Lucky Cowboy- Consent Obtained.   Riyana Biel A 06/04/2017, 12:02 PM

## 2017-06-04 NOTE — Progress Notes (Signed)
Initial Nutrition Assessment  DOCUMENTATION CODES:   Non-severe (moderate) malnutrition in context of chronic illness  INTERVENTION:  Once diet able to be advanced recommend Nepro Shake po TID, each supplement provides 425 kcal and 19 grams protein.  Encouraged adequate intake of calories and protein at meals. Patient may benefit from small, frequent meals in setting of ascites.  Recommend Rena-vite QHS.  Recommend vitamin C 500 mg daily. Ascorbic acid was low at 0.1 mg/dL on 01/24/2017.  Recommend weekly vitamin D supplementation with dialysis. 25-hydroxy vitamin D was low at 23.4 ng/mL on 01/24/2017.  NUTRITION DIAGNOSIS:   Moderate Malnutrition related to chronic illness(ESRD on HD, EtOH abuse, CHF, cirrhosis) as evidenced by moderate muscle depletion, severe muscle depletion, moderate fat depletion.  GOAL:   Patient will meet greater than or equal to 90% of their needs  MONITOR:   PO intake, Supplement acceptance, Diet advancement, Labs, Weight trends, I & O's  REASON FOR ASSESSMENT:   Malnutrition Screening Tool    ASSESSMENT:   57 year old male with PMHx of CAD, hx triple vessel bypass 2009,  HTN, GERD, EtOH abuse, drug abuse, DM type 2, diabetic peripheral neuropathy, ESRD on HD, cirrhosis, HLD, PAD, CHF, hx multiple GI bleeds who is now admitted with upper GI bleed s/p EGD 3/23 which found LA Grade B reflux esophagitis and Grade I non-bleeding esophageal varices, hyperkalemia requiring urgent HD, also with malfunctioning right AV fistula s/p placement of temporary HD catheter 3/24 and plan for fistulogram and declot later in admission.   -Plan is for repeat EGD as small bowel could not be evaluated due to large amount of food in stomach.  Met with patient at bedside. He is known to this RD from a previous admission last summer. Patient reports that he is no longer at his previous outpatient HD center (seems he got into verbal confrontation with some staff there). He  now comes to the [facility] ED to dialysis. He has issues with rides so he occasionally misses HD sessions or has to leave the session early in order to have a ride home. Due to his inadequate dialysis lately he has been feeling worse and not able to eat very well. He was also receiving Nepro at his dialysis center which he does not have access to anymore. He still tries to eat 3 meals per day but is eating much small amounts at meals. He may only take a few bites of eggs and have a few pieces of bacon. Patient with poor dentition but he does have partial dentures. He reports he has not been taking his vitamin C at home and he is not sure if they are infusing vitamin D at dialysis. Abdomen very taut and distended today.  Patient reports his UBW was 200-207 lbs. On 08/11/2016 patient was 173.5 lbs and on 01/09/2017 he was 154.7 lbs. He lost 18.8 lbs (10.8% body weight) over 5 months, which is significant for time frame. Weight fluctuates higher occasionally with fluid. Likely has lost even more body weight as current weight likely falsely elevated (abdomen very taut from ascites).  Medications reviewed and include: Lasix 20 mg IV once today, Lasix 80 mg PO daily, Novolog 0-9 units Q6hrs, pantoprazole, ceftriaxone.  Labs reviewed: CBG 69-86, Potassium 5.3, BUN 49, Creatinine 6.46. On 3/23 Iron WNL at 55, TIBC WNL at 332, Ferritin WNL at 53, Folate WNL at 16.7, vitamin B12 WNL at 498. On 01/24/2017 25-hydroxy vitamin D was low at 23.4 ng/mL and ascorbic acid (vitamin C)  was low at 0.1 mg/dL.  Patient is quickly progressing towards severe chronic malnutrition (currently moderate).  NUTRITION - FOCUSED PHYSICAL EXAM:    Most Recent Value  Orbital Region  Moderate depletion  Upper Arm Region  Severe depletion  Thoracic and Lumbar Region  Unable to assess  Buccal Region  Moderate depletion  Temple Region  Severe depletion  Clavicle Bone Region  Severe depletion  Clavicle and Acromion Bone Region  Severe  depletion  Scapular Bone Region  Moderate depletion  Dorsal Hand  Moderate depletion  Patellar Region  Severe depletion  Anterior Thigh Region  Severe depletion  Posterior Calf Region  Severe depletion  Edema (RD Assessment)  None [abdomen is taut and distended from ascites]  Hair  Reviewed  Eyes  Reviewed  Mouth  Reviewed [poor dentition,  pt has partial dentures]  Skin  Reviewed  Nails  Reviewed     Diet Order:  Diet clear liquid Room service appropriate? Yes; Fluid consistency: Thin Diet NPO time specified  EDUCATION NEEDS:   Education needs have been addressed  Skin:  Skin Assessment: Skin Integrity Issues:(cracking to bilateral feet)  Last BM:  06/02/2017  Height:   Ht Readings from Last 1 Encounters:  06/02/17 '6\' 3"'$  (1.905 m)    Weight:   Wt Readings from Last 1 Encounters:  06/02/17 155 lb 14.4 oz (70.7 kg)    Ideal Body Weight:  89.1 kg  BMI:  Body mass index is 19.49 kg/m.  Estimated Nutritional Needs:   Kcal:  6712-4580 (MSJ x 1.3-1.5)  Protein:  105-120 grams (1.5-1.7 grams/kg)  Fluid:  1.5-1.8 L/day  Willey Blade, MS, RD, LDN Office: [phone] Pager: [phone] After Hours/Weekend Pager: [phone]

## 2017-06-04 NOTE — Progress Notes (Signed)
HD Tx started    06/04/17 1345  Vital Signs  Pulse Rate 91  Pulse Rate Source Monitor  Resp (!) 26  BP 105/63  BP Location Left Arm  BP Method Automatic  Patient Position (if appropriate) Lying  Oxygen Therapy  SpO2 99 %  O2 Device Nasal Cannula  O2 Flow Rate (L/min) 2 L/min  Pulse Oximetry Type Continuous  During Hemodialysis Assessment  Blood Flow Rate (mL/min) 300 mL/min  Arterial Pressure (mmHg) -170 mmHg  Venous Pressure (mmHg) 210 mmHg  Transmembrane Pressure (mmHg) 50 mmHg  Ultrafiltration Rate (mL/min) 1790 mL/min  Dialysate Flow Rate (mL/min) 800 ml/min  Conductivity: Machine  14  HD Safety Checks Performed Yes  Dialysis Fluid Bolus Normal Saline  Bolus Amount (mL) 250 mL  Intra-Hemodialysis Comments Tx initiated (Pt awake stable)

## 2017-06-04 NOTE — Progress Notes (Signed)
HD Tx ended   06/04/17 1715  Vital Signs  Temp 98.2 F (36.8 C)  Temp Source Oral  Pulse Rate (!) 102  Pulse Rate Source Monitor  Resp (!) 25  BP 109/70  BP Location Left Arm  BP Method Automatic  Patient Position (if appropriate) Lying  Oxygen Therapy  SpO2 100 %  O2 Device Nasal Cannula  O2 Flow Rate (L/min) 2 L/min  Pulse Oximetry Type Continuous  During Hemodialysis Assessment  HD Safety Checks Performed Yes  Dialysis Fluid Bolus Normal Saline  Bolus Amount (mL) 250 mL  Intra-Hemodialysis Comments Tx completed;Tolerated well

## 2017-06-04 NOTE — Progress Notes (Signed)
[facility]  ROUNDING NOTE   Subjective:   Patient presents to the emergency room for evaluation of hematemesis He has known GI AVMs Patient was last dialyzed on Friday He underwent endoscopy yesterday, grade 1 esophageal varices and reflux esophagitis was seen which was thought to be the cause of GI bleed He will need a repeat upper endoscopy on Monday. Today, he is short of breath His access does not have any bruit or thrill  Objective:  Vital signs in last 24 hours:  Temp:  [97.3 F (36.3 C)-98.7 F (37.1 C)] 98.1 F (36.7 C) (03/24 0322) Pulse Rate:  [81-117] 94 (03/24 0815) Resp:  [12-40] 20 (03/24 0444) BP: (72-115)/(27-84) 111/59 (03/24 0815) SpO2:  [79 %-100 %] 98 % (03/24 0815)  Weight change:  Filed Weights   06/02/17 1312 06/02/17 1614  Weight: 166 lb (75.3 kg) 155 lb 14.4 oz (70.7 kg)    Intake/Output: I/O last 3 completed shifts: In: 5631 [I.V.:500; Blood:890] Out: 90 [Urine:100; Other:320]   Intake/Output this shift:  Total I/O In: 657 [P.O.:657] Out: -   Physical Exam: General: NAD,   Head: Normocephalic, atraumatic. Moist oral mucosal membranes  Eyes: Anicteric,  Neck: Supple,   Lungs:  Clear to auscultation  Heart: Regular rate and rhythm  Abdomen:  Soft, nontender, distended  Extremities:  no peripheral edema.  Neurologic: Nonfocal, moving all four extremities  Skin: No lesions  Access: RUE AVF - no bruit or thrill    Basic Metabolic Panel: Recent Labs  Lab 05/29/17 1616 06/02/17 1352 06/03/17 0733 06/04/17 0528  NA 132* 136 141 138  K 6.1* 6.4* 5.2* 5.3*  CL 98* 100* 103 101  CO2 23 26 29 25   GLUCOSE 84 88 97 93  BUN 69* 55* 34* 49*  CREATININE 9.56* 7.59* 4.75* 6.46*  CALCIUM 8.3* 8.5* 8.0* 7.5*  PHOS 3.1  --   --   --     Liver Function Tests: Recent Labs  Lab 05/29/17 1616 06/02/17 1352  AST  --  38  ALT  --  37  ALKPHOS  --  172*  BILITOT  --  0.5  PROT  --  6.8  ALBUMIN 2.4* 2.7*   No results  for input(s): LIPASE, AMYLASE in the last 168 hours. No results for input(s): AMMONIA in the last 168 hours.  CBC: Recent Labs  Lab 06/02/17 1352  06/03/17 0006 06/03/17 0733 06/03/17 1440 06/03/17 2104 06/04/17 0528  WBC 7.0  --   --   --   --   --  13.1*  HGB 6.4*   < > 7.2* 8.1* 11.1* 9.4* 8.4*  HCT 21.2*  --   --   --   --   --  26.7*  MCV 87.9  --   --   --   --   --  87.8  PLT 251  --   --   --   --   --  219   < > = values in this interval not displayed.    Cardiac Enzymes: No results for input(s): CKTOTAL, CKMB, CKMBINDEX, TROPONINI in the last 168 hours.  BNP: Invalid input(s): POCBNP  CBG: Recent Labs  Lab 06/03/17 1827 06/03/17 2242 06/03/17 2345 06/04/17 0317 06/04/17 0450  GLUCAP 78 76 69 81 75    Microbiology: Results for orders placed or performed during the hospital encounter of 06/02/17  MRSA PCR Screening     Status: None   Collection Time: 06/02/17  4:22 PM  Result Value Ref Range Status   MRSA by PCR NEGATIVE NEGATIVE Final    Comment:        The GeneXpert MRSA Assay (FDA approved for NASAL specimens only), is one component of a comprehensive MRSA colonization surveillance program. It is not intended to diagnose MRSA infection nor to guide or monitor treatment for MRSA infections. Performed at [facility], [address] [place], [place] [address]     Coagulation Studies: No results for input(s): LABPROT, INR in the last 72 hours.  Urinalysis: No results for input(s): COLORURINE, LABSPEC, PHURINE, GLUCOSEU, HGBUR, BILIRUBINUR, KETONESUR, PROTEINUR, UROBILINOGEN, NITRITE, LEUKOCYTESUR in the last 72 hours.  Invalid input(s): APPERANCEUR    Imaging: Ct Angio Chest Pe W Or Wo Contrast  Result Date: 06/03/2017 CLINICAL DATA:  Hypoxia EXAM: CT ANGIOGRAPHY CHEST WITH CONTRAST TECHNIQUE: Multidetector CT imaging of the chest was performed using the standard protocol during bolus administration of intravenous contrast.  Multiplanar CT image reconstructions and MIPs were obtained to evaluate the vascular anatomy. CONTRAST:  105mL ISOVUE-370 IOPAMIDOL (ISOVUE-370) INJECTION 76% COMPARISON:  Plain film from 06/03/2017, CT from 03/20/16 FINDINGS: Cardiovascular: Thoracic aorta and its branches demonstrate atherosclerotic calcifications without aneurysmal dilatation or dissection. No significant cardiac enlargement is noted. Heavy coronary calcifications are seen. The pulmonary artery is well visualized with a normal branching pattern. No filling defects to suggest pulmonary emboli are identified. Mediastinum/Nodes: Thoracic inlet is within normal limits. No significant hilar or mediastinal adenopathy is identified. The esophagus is within normal limits. Lungs/Pleura: Emphysematous changes are noted in the lungs bilaterally. The right lung is clear with the exception of some alveolar infiltrate in the lower lobe. More marked alveolar infiltrate is noted in the left lower lobe with areas of consolidation. These changes are also noted but to a slightly lesser degree in the left upper lobe. These have increased in the interval from the prior plain film examination today. Upper Abdomen: Visualized upper abdomen demonstrates cirrhotic change of the liver with moderate ascites. No other focal abnormality in the upper abdomen is seen. No evidence of varices are noted. Musculoskeletal: Degenerative changes of the thoracic spine are noted. Old right rib fractures are noted. Review of the MIP images confirms the above findings. IMPRESSION: Diffuse left-sided infiltrate slightly greater in the lower lobe than the upper lobe with minimal infiltrative changes in the right lower lobe. Given the abrupt change from the recent plain film this likely represents some asymmetric pulmonary edema. Clinical correlation is recommended. Cirrhotic change with evidence of moderate ascites. No evidence of pulmonary embolism. Aortic Atherosclerosis (ICD10-I70.0) and  Emphysema (ICD10-J43.9). Electronically Signed   By: Inez Catalina M.D.   On: 06/03/2017 17:45   Dg Chest Port 1 View  Result Date: 06/03/2017 CLINICAL DATA:  Vomiting. EXAM: PORTABLE CHEST 1 VIEW COMPARISON:  May 17, 2017 FINDINGS: Cardiomegaly, stable. The hila and mediastinum are unchanged. No pneumothorax. No pulmonary nodules or masses. No focal infiltrates. IMPRESSION: No active disease. Electronically Signed   By: Dorise Bullion III M.D   On: 06/03/2017 11:40     Medications:   . aspirin  81 mg Oral Daily  . atorvastatin  40 mg Oral Daily  . furosemide  20 mg Intravenous Once  . furosemide  80 mg Oral Daily  . gabapentin  100 mg Oral BID  . insulin aspart  0-9 Units Subcutaneous Q6H  . labetalol  100 mg Oral Daily  . mometasone-formoterol  2 puff Inhalation BID  . pantoprazole (PROTONIX) IV  40 mg  Intravenous Q12H  . sodium chloride flush  3 mL Intravenous Q12H  . tiotropium  18 mcg Inhalation Daily      Assessment/ Plan:  [name] is a 57 y.o. black male withend stage renal disease on hemodialysis secondary to Alport's syndrome, ascites, hypertension, anemia of chronic kidney disease, coronary artery disease, peripheral vascular disease, hyperlipidemia, gastrointestinal AVMs, pulmonary hypertension,   nooutpatientdialysis unit currently  1.  Acute pulmonary edema 2.  Anemia of chronic kidney disease and GI Bleed 3.  Secondary Hyperparathyroidism    4.  Ascites 5.  End-stage renal disease  Hemodialysis on a Monday, Wednesday Friday schedule - urgent dialysis today for acute pulmonary edema - last Phos 3.1 (3/18) - EPO with HD Next HD tentatively on Wednesday after today's HD   LOS: 2 Stanley [name] 3/24/201911:03 AM

## 2017-06-04 NOTE — Progress Notes (Signed)
Post HD Tx  

## 2017-06-04 NOTE — Progress Notes (Signed)
HD Tx completed    06/04/17 1715  Vital Signs  Temp 98.2 F (36.8 C)  Temp Source Oral  Pulse Rate (!) 102  Pulse Rate Source Monitor  Resp (!) 25  BP 109/70  BP Location Left Arm  BP Method Automatic  Patient Position (if appropriate) Lying  Oxygen Therapy  SpO2 100 %  O2 Device Nasal Cannula  O2 Flow Rate (L/min) 2 L/min  Pulse Oximetry Type Continuous

## 2017-06-04 NOTE — Progress Notes (Signed)
Will plan on repeat Upper Endoscopy during the week, likely Monday. Small bowel could not be evaluated due to large amount of food in stomach on procedure done yesterday (to prevent aspiration and due to no active bleeding in stomach and stable Hgb, it was considered safest to allow for food to pass, allow for a longer fasting period before repeating procedure).   Paracentesis planned for today.   Ok to continue clear liquids only today. No solid food.  NPO after midnight for procedure tomorrow.

## 2017-06-04 NOTE — Procedures (Signed)
Temporary Hemodialysis Catheter Insertion Procedure Note Stanley [name] [id number] 01-Jan-1961  Procedure: Insertion of Temporary Hemodialysis  Catheter- right groin Indications: Hemodialysis  Procedure Details Consent: Risks of procedure as well as the alternatives and risks of each were explained to the (patient/caregiver).  Consent for procedure obtained. Time Out: Verified patient identification, verified procedure, site/side was marked, verified correct patient position, special equipment/implants available, medications/allergies/relevent history reviewed, required imaging and test results available.  Performed  Maximum sterile technique was used including antiseptics, cap, gloves, gown, hand hygiene, mask and sheet. Skin prep: Chlorhexidine; local anesthetic administered A antimicrobial bonded/coated double lumen temporary catheter was placed in the right femoral vein due to patient being a dialysis patient using the Seldinger technique.  Evaluation Blood flow good Complications: No apparent complications Patient did tolerate procedure well.   Esco, Miechia A 06/04/2017, 12:15 PM

## 2017-06-04 NOTE — Progress Notes (Signed)
Pre HD Tx    06/04/17 1339  Hand-Off documentation  Report given to (Full Name) Beatris Ship, RN   Report received from (Full Name) Runell Gess, RN   Vital Signs  Temp 98.1 F (36.7 C)  Temp Source Oral  Pulse Rate 93  Pulse Rate Source Monitor  Resp (!) 27  BP (!) 98/59  BP Location Left Arm  BP Method Automatic  Patient Position (if appropriate) Lying  Oxygen Therapy  SpO2 98 %  O2 Device Nasal Cannula  O2 Flow Rate (L/min) 2 L/min  Pulse Oximetry Type Continuous  Pain Assessment  Pain Scale 0-10  Pain Score 0  Pre Treatment Patient Checks  Vascular access used during treatment Catheter  Patient is receiving dialysis in a chair Yes  Hepatitis B Surface Antigen Results Negative  Date Hepatitis B Surface Antigen Drawn 03/01/17  Hepatitis B Surface Antibody  (>10)  Date Hepatitis B Surface Antibody Drawn 03/01/17  Hemodialysis Consent Verified Yes  Hemodialysis Standing Orders Initiated Yes  ECG (Telemetry) Monitor On Yes  Prime Ordered Normal Saline  Length of  DialysisTreatment -hour(s) 3.5 Hour(s)  Dialysis Treatment Comments Na 140  Dialyzer Elisio 17H NR  Dialysate 2K, 2.5 Ca  Dialysate Flow Ordered 800  Blood Flow Rate Ordered 400 mL/min (changed to 300 for newly placed catheter )  Ultrafiltration Goal 2.5 Liters  Dialysis Blood Pressure Support Ordered Normal Saline

## 2017-06-04 NOTE — Progress Notes (Signed)
Post HD [facility]    06/04/17 1730  Hand-Off documentation  Report given to (Full Name) Runell Gess, RN   Report received from (Full Name) Beatris Ship, RN   Vital Signs  Pulse Rate (!) 105  Pulse Rate Source Monitor  Resp (!) 23  BP (!) 110/59  BP Location Left Arm  BP Method Automatic  Patient Position (if appropriate) Lying  Oxygen Therapy  SpO2 97 %  O2 Device Nasal Cannula  O2 Flow Rate (L/min) 2 L/min  Pulse Oximetry Type Continuous  Pain Assessment  Pain Scale 0-10  Pain Score 0  Post-Hemodialysis Assessment  Rinseback Volume (mL) 250 mL  Dialyzer Clearance Lightly streaked  Duration of HD Treatment -hour(s) 3.5 hour(s)  Hemodialysis Intake (mL) 500 mL  UF Total -Machine (mL) 4513 mL  Net UF (mL) 4013 mL  Tolerated HD Treatment Yes  Education / Care Plan  Dialysis Education Provided Yes  Documented Education in Care Plan Yes  Hemodialysis Catheter Right Femoral vein Double-lumen;Temporary  Placement Date: 06/04/17   Person Inserting Catheter: Dr. Lorenso Courier  Orientation: Right  Access Location: Femoral vein  Hemodialysis Catheter Type: Double-lumen;Temporary  Site Condition No complications  Blue Lumen Status Heparin locked  Red Lumen Status Heparin locked  Purple Lumen Status Capped (Central line)  Catheter fill solution Heparin 1000 units/ml  Catheter fill volume (Arterial) 1.4 cc  Catheter fill volume (Venous) 1.4  Dressing Type Gauze/Drain sponge;Occlusive  Dressing Status Clean;Dry;Intact  Dressing Change Due 06/04/17  Post treatment catheter status Capped and Clamped

## 2017-06-04 NOTE — Progress Notes (Signed)
[place] [facility] [place] NAME: Stanley [name]    MR#:  [id number]  DATE OF BIRTH:  Oct 19, 1967  SUBJECTIVE:   Patient was found to have hypoxia yesterday.  CT chest showed pulmonary edema. Patient still feeling short of breath this morning.    REVIEW OF SYSTEMS:    Review of Systems  Constitutional: Negative for fever, chills weight loss HENT: Negative for ear pain, nosebleeds, congestion, facial swelling, rhinorrhea, neck pain, neck stiffness and ear discharge.   Respiratory: Positive shortness of breath no wheezing or cough  cardiovascular: Negative for chest pain, palpitations and leg swelling.  Gastrointestinal: Negative for heartburn, abdominal pain, vomiting, diarrhea or consitpation Genitourinary: Negative for dysuria, urgency, frequency, hematuria Musculoskeletal: Negative for back pain or joint pain Neurological: Negative for dizziness, seizures, syncope, focal weakness,  numbness and headaches.  Hematological: Does not bruise/bleed easily.  Psychiatric/Behavioral: Negative for hallucinations, confusion, dysphoric mood    Tolerating Diet: yes      DRUG ALLERGIES:  No Known Allergies  VITALS:  Blood pressure (!) 111/59, pulse 94, temperature 98.1 F (36.7 C), temperature source Oral, resp. rate 20, height 6\' 3"  (1.905 m), weight 70.7 kg (155 lb 14.4 oz), SpO2 98 %.  PHYSICAL EXAMINATION:  Constitutional: Appears thin and frail. No distress. HENT: Normocephalic. Marland Kitchen Oropharynx is clear and moist.  Eyes: Conjunctivae and EOM are normal. PERRLA, no scleral icterus.  Neck: Normal ROM. Neck supple. No JVD. No tracheal deviation. CVS: RRR, S1/S2 +, 3/6 HSM, no gallops, no carotid bruit.  Pulmonary: Normal respiratory effort with crackles heard left base  abdominal: Slight distention without rebound or guarding  musculoskeletal: Normal range of motion. No edema and no tenderness.  Neuro: Alert. CN 2-12 grossly intact. No focal  deficits. Skin: Skin is warm and dry. No rash noted. Psychiatric: Normal mood and affect.      LABORATORY PANEL:   CBC Recent Labs  Lab 06/04/17 0528  WBC 13.1*  HGB 8.4*  HCT 26.7*  PLT 219   ------------------------------------------------------------------------------------------------------------------  Chemistries  Recent Labs  Lab 06/02/17 1352  06/04/17 0528  NA 136   < > 138  K 6.4*   < > 5.3*  CL 100*   < > 101  CO2 26   < > 25  GLUCOSE 88   < > 93  BUN 55*   < > 49*  CREATININE 7.59*   < > 6.46*  CALCIUM 8.5*   < > 7.5*  AST 38  --   --   ALT 37  --   --   ALKPHOS 172*  --   --   BILITOT 0.5  --   --    < > = values in this interval not displayed.   ------------------------------------------------------------------------------------------------------------------  Cardiac Enzymes No results for input(s): TROPONINI in the last 168 hours. ------------------------------------------------------------------------------------------------------------------  RADIOLOGY:  Ct Angio Chest Pe W Or Wo Contrast  Result Date: 06/03/2017 CLINICAL DATA:  Hypoxia EXAM: CT ANGIOGRAPHY CHEST WITH CONTRAST TECHNIQUE: Multidetector CT imaging of the chest was performed using the standard protocol during bolus administration of intravenous contrast. Multiplanar CT image reconstructions and MIPs were obtained to evaluate the vascular anatomy. CONTRAST:  9mL ISOVUE-370 IOPAMIDOL (ISOVUE-370) INJECTION 76% COMPARISON:  Plain film from 06/03/2017, CT from 03/20/16 FINDINGS: Cardiovascular: Thoracic aorta and its branches demonstrate atherosclerotic calcifications without aneurysmal dilatation or dissection. No significant cardiac enlargement is noted. Heavy coronary calcifications are seen. The pulmonary artery is well visualized with a normal branching  pattern. No filling defects to suggest pulmonary emboli are identified. Mediastinum/Nodes: Thoracic inlet is within normal limits. No  significant hilar or mediastinal adenopathy is identified. The esophagus is within normal limits. Lungs/Pleura: Emphysematous changes are noted in the lungs bilaterally. The right lung is clear with the exception of some alveolar infiltrate in the lower lobe. More marked alveolar infiltrate is noted in the left lower lobe with areas of consolidation. These changes are also noted but to a slightly lesser degree in the left upper lobe. These have increased in the interval from the prior plain film examination today. Upper Abdomen: Visualized upper abdomen demonstrates cirrhotic change of the liver with moderate ascites. No other focal abnormality in the upper abdomen is seen. No evidence of varices are noted. Musculoskeletal: Degenerative changes of the thoracic spine are noted. Old right rib fractures are noted. Review of the MIP images confirms the above findings. IMPRESSION: Diffuse left-sided infiltrate slightly greater in the lower lobe than the upper lobe with minimal infiltrative changes in the right lower lobe. Given the abrupt change from the recent plain film this likely represents some asymmetric pulmonary edema. Clinical correlation is recommended. Cirrhotic change with evidence of moderate ascites. No evidence of pulmonary embolism. Aortic Atherosclerosis (ICD10-I70.0) and Emphysema (ICD10-J43.9). Electronically Signed   By: Inez Catalina M.D.   On: 06/03/2017 17:45   Dg Chest Port 1 View  Result Date: 06/03/2017 CLINICAL DATA:  Vomiting. EXAM: PORTABLE CHEST 1 VIEW COMPARISON:  May 17, 2017 FINDINGS: Cardiomegaly, stable. The hila and mediastinum are unchanged. No pneumothorax. No pulmonary nodules or masses. No focal infiltrates. IMPRESSION: No active disease. Electronically Signed   By: Dorise Bullion III M.D   On: 06/03/2017 11:40     ASSESSMENT AND PLAN:   57 year old male with end-stage renal disease on hemodialysis, alcoholic cirrhosis, severe pulmonary hypertension with moderate to  severe MR, chronic diastolic heart failure with preserved ejection fraction and CAD who presents with hematemesis  1.  Upper GI bleed: Endoscopy shows LA grade 2 reflux esophagitis and grade 1 esophageal varices with no recent or active GI bleeding Recommendations to continue PPI  Repeat EGD for tomorrow, small bowel cannot be evaluated due to large amount of food in the stomach Avoid NSAIDs except aspirin Continue Rocephin in the setting of ascites and GI bleed   2.  Acute on chronic blood loss anemia due to LAD grade 2 esophagitis Patient is status post 2 unit PRBCs Hemoglobin remains relatively stable Anemia panel consistent with chronic disease Continue Epogen with dialysis   3.  [name] with hyperkalemia: Patient underwent emergent dialysis on admission and will require dialysis today since he received IV contrast yesterday for CT scan and chest x-ray showing pulmonary edema  Normal schedule of dialysis Monday, Wednesday and Friday   4.  Acute hypoxic respiratory failure due to acute on chronic diastolic heart failure with preserved ejection fraction without signs of exacerbation Patient will have dialysis today.   Case discussed with Dr. [name]  Wean oxygen to room air no ACEI/ARB given ESRD as per his cardiologist  5.  CAD/CABG: Continue atorvastatin and ASA ok to start as per GI   6.  Essential hypertension: Continue labetalol   7. Severe MR/pulmonary HTM: Follows with Cardiology Cardiology is Considering Metoprolol in place of labetolol, but will defer this to his outpatient cardioloigst  8. Liver cirrhosis: GI is recommending paracentesis. May plan for tomorrow since he is getting HD today Patient does not feel like  he has a lot of fluid build up in abdomen  Management plans discussed with the patient and he is in agreement.  CODE STATUS: FULL  TOTAL TIME TAKING CARE OF THIS PATIENT: 24 minutes.   D/w dr [name]  POSSIBLE D/C  2-3 days DEPENDING ON CLINICAL CONDITION.   Tarnisha Kachmar M.D on 06/04/2017 at 12:02 PM  Between 7am to 6pm - Pager - [phone] After 6pm go to [email or web address] - password [email or web address] [facility]  Office  [phone]  CC: Primary care physician; [name], MD  Note: This dictation was prepared with Dragon dictation along with smaller phrase technology. Any transcriptional errors that result from this process are unintentional.

## 2017-06-04 NOTE — Progress Notes (Signed)
PT Cancellation Note  Patient Details Name: Stanley [name] MRN: [id number] DOB: 1961-02-20   Cancelled Treatment:    Reason Eval/Treat Not Completed: Medical issues which prohibited therapy(Consult received and chart reviewed. Per notes review, patient with newly inserted temporary dialysis catheter to R groin.  Contrainidicated for mobility until dialysis catheter removed.  Will complete order at this time; please re-consult as medically appropriate.)   [name] H. Owens Shark, PT, DPT, NCS 06/04/17, 1:13 PM [phone]

## 2017-06-04 NOTE — Progress Notes (Signed)
Pre Hd Assessment    06/04/17 1340  Neurological  Level of Consciousness Alert  Orientation Level Oriented X4  Respiratory  Respiratory Pattern Regular;Unlabored;Symmetrical  Chest Assessment Chest expansion symmetrical  Bilateral Breath Sounds Rhonchi  R Lower Breath Sounds Diminished  L Lower Breath Sounds Diminished  Cardiac  Pulse Irregular  Antiarrhythmic device No  GU Assessment  Genitourinary Symptoms  (HD Pt)  Psychosocial  Psychosocial (WDL) WDL

## 2017-06-05 ENCOUNTER — Encounter: Payer: Self-pay | Admitting: Gastroenterology

## 2017-06-05 ENCOUNTER — Ambulatory Visit: Admission: RE | Admit: 2017-06-05 | Payer: Medicare Other | Source: Ambulatory Visit

## 2017-06-05 ENCOUNTER — Encounter
Admission: EM | Discharge status: Against Medical Advice | Payer: Self-pay | Source: Home / Self Care | Attending: Internal Medicine

## 2017-06-05 ENCOUNTER — Inpatient Hospital Stay: Payer: Medicare Other | Admitting: Anesthesiology

## 2017-06-05 ENCOUNTER — Inpatient Hospital Stay: Payer: Medicare Other

## 2017-06-05 DIAGNOSIS — K3189 Other diseases of stomach and duodenum: Secondary | ICD-10-CM

## 2017-06-05 DIAGNOSIS — I8510 Secondary esophageal varices without bleeding: Secondary | ICD-10-CM

## 2017-06-05 DIAGNOSIS — K7031 Alcoholic cirrhosis of liver with ascites: Secondary | ICD-10-CM

## 2017-06-05 DIAGNOSIS — K210 Gastro-esophageal reflux disease with esophagitis: Principal | ICD-10-CM

## 2017-06-05 DIAGNOSIS — K922 Gastrointestinal hemorrhage, unspecified: Secondary | ICD-10-CM

## 2017-06-05 DIAGNOSIS — E440 Moderate protein-calorie malnutrition: Secondary | ICD-10-CM

## 2017-06-05 HISTORY — PX: ENTEROSCOPY: SHX5533

## 2017-06-05 LAB — GLUCOSE, CAPILLARY
GLUCOSE-CAPILLARY: 75 mg/dL (ref 65–99)
Glucose-Capillary: 88 mg/dL (ref 65–99)
Glucose-Capillary: 103 mg/dL — ABNORMAL HIGH (ref 65–99)

## 2017-06-05 LAB — BILIRUBIN, TOTAL: Total Bilirubin: 0.5 mg/dL (ref 0.3–1.2)

## 2017-06-05 LAB — ALBUMIN, PLEURAL OR PERITONEAL FLUID: ALBUMIN FL: 1.5 g/dL

## 2017-06-05 LAB — BASIC METABOLIC PANEL
Anion gap: 9 (ref 5–15)
BUN: 34 mg/dL — AB (ref 6–20)
CO2: 28 mmol/L (ref 22–32)
CREATININE: 4.79 mg/dL — AB (ref 0.61–1.24)
Calcium: 7.2 mg/dL — ABNORMAL LOW (ref 8.9–10.3)
Chloride: 99 mmol/L — ABNORMAL LOW (ref 101–111)
GFR calc Af Amer: 14 mL/min — ABNORMAL LOW (ref >60)
GFR, EST NON AFRICAN AMERICAN: 12 mL/min — AB (ref >60)
Glucose, Bld: 98 mg/dL (ref 65–99)
Potassium: 4.4 mmol/L (ref 3.5–5.1)
SODIUM: 136 mmol/L (ref 135–145)

## 2017-06-05 LAB — CBC
HCT: 23.1 % — ABNORMAL LOW (ref 40.0–52.0)
Hemoglobin: 7.1 g/dL — ABNORMAL LOW (ref 13.0–18.0)
MCH: 26.7 pg (ref 26.0–34.0)
MCHC: 30.9 g/dL — AB (ref 32.0–36.0)
MCV: 86.4 fL (ref 80.0–100.0)
PLATELETS: 224 10*3/uL (ref 150–440)
RBC: 2.67 MIL/uL — ABNORMAL LOW (ref 4.40–5.90)
RDW: 17.7 % — ABNORMAL HIGH (ref 11.5–14.5)
WBC: 14.2 10*3/uL — ABNORMAL HIGH (ref 3.8–10.6)

## 2017-06-05 LAB — LACTATE DEHYDROGENASE, PLEURAL OR PERITONEAL FLUID: LD, Fluid: 118 U/L — ABNORMAL HIGH (ref 3–23)

## 2017-06-05 LAB — BODY FLUID CELL COUNT WITH DIFFERENTIAL
Eos, Fluid: 0 %
LYMPHS FL: 3 %
Monocyte-Macrophage-Serous Fluid: 25 %
NEUTROPHIL FLUID: 72 %
Other Cells, Fluid: 0 %
Total Nucleated Cell Count, Fluid: 785 cu mm

## 2017-06-05 LAB — PROTEIN, PLEURAL OR PERITONEAL FLUID: Total protein, fluid: 3.1 g/dL

## 2017-06-05 LAB — GLUCOSE, PLEURAL OR PERITONEAL FLUID: GLUCOSE FL: 91 mg/dL

## 2017-06-05 LAB — PATHOLOGIST SMEAR REVIEW

## 2017-06-05 LAB — HEMOGLOBIN
HEMOGLOBIN: 7 g/dL — AB (ref 13.0–18.0)
Hemoglobin: 7.7 g/dL — ABNORMAL LOW (ref 13.0–18.0)

## 2017-06-05 LAB — AMYLASE, PLEURAL OR PERITONEAL FLUID: Amylase, Fluid: 32 U/L

## 2017-06-05 SURGERY — ENTEROSCOPY
Anesthesia: General

## 2017-06-05 MED ORDER — PROPOFOL 500 MG/50ML IV EMUL
INTRAVENOUS | Status: DC | PRN
  Administered 2017-06-05: 50 ug/kg/min via INTRAVENOUS

## 2017-06-05 MED ORDER — LIDOCAINE HCL (CARDIAC) 20 MG/ML IV SOLN
INTRAVENOUS | Status: DC | PRN
  Administered 2017-06-05: 75 mg via INTRAVENOUS

## 2017-06-05 MED ORDER — PROPOFOL 500 MG/50ML IV EMUL
INTRAVENOUS | Status: AC
  Filled 2017-06-05: qty 50

## 2017-06-05 MED ORDER — TRAMADOL HCL 50 MG PO TABS: 50.0000 mg | ORAL_TABLET | Freq: Three times a day (TID) | ORAL | Status: DC | PRN

## 2017-06-05 MED ORDER — PHENYLEPHRINE HCL 10 MG/ML IJ SOLN
INTRAMUSCULAR | Status: DC | PRN
  Administered 2017-06-05 (×3): 150 ug via INTRAVENOUS
  Administered 2017-06-05: 200 ug via INTRAVENOUS

## 2017-06-05 MED ORDER — PROPOFOL 10 MG/ML IV BOLUS
INTRAVENOUS | Status: DC | PRN
  Administered 2017-06-05: 40 mg via INTRAVENOUS
  Administered 2017-06-05: 30 mg via INTRAVENOUS

## 2017-06-05 MED ORDER — SODIUM CHLORIDE 0.9 % IV SOLN
Freq: Once | INTRAVENOUS | Status: AC
  Administered 2017-06-05: 12:00:00 via INTRAVENOUS

## 2017-06-05 MED ORDER — LIDOCAINE HCL (PF) 2 % IJ SOLN
INTRAMUSCULAR | Status: AC
  Filled 2017-06-05: qty 10

## 2017-06-05 MED ORDER — EPHEDRINE SULFATE 50 MG/ML IJ SOLN: INTRAMUSCULAR | Status: DC | PRN

## 2017-06-05 NOTE — Op Note (Signed)
[facility] Patient Name: Stanley [name] Procedure Date: 06/05/2017 1:53 PM MRN: [id number] Account #: [id number] Date of Birth: 1960/11/14 Admit Type: Inpatient Age: 57 Room: [facility] ENDO ROOM 4 Gender: Male Note Status: Finalized Procedure:            Small bowel enteroscopy Indications:          Recurrent gastrointestinal bleeding Providers:            Kepler Mccabe B. Bonna Gains MD, MD Referring MD:         Dyke Maes. Mancheno Revelo (Referring MD) Medicines:            Monitored Anesthesia Care Complications:        No immediate complications. Procedure:            Pre-Anesthesia Assessment:                       - Prior to the procedure, a History and Physical was                        performed, and patient medications, allergies and                        sensitivities were reviewed. The patient's tolerance of                        previous anesthesia was reviewed.                       - The risks and benefits of the procedure and the                        sedation options and risks were discussed with the                        patient. All questions were answered and informed                        consent was obtained.                       - Patient identification and proposed procedure were                        verified prior to the procedure by the physician, the                        nurse, the anesthesiologist, the anesthetist and the                        technician. The procedure was verified in the                        pre-procedure area in the procedure room in the                        endoscopy suite.                       - The risks and benefits of the procedure and the  sedation options and risks were discussed with the                        patient. All questions were answered and informed                        consent was obtained.                       - ASA Grade Assessment: III - A patient  with severe                        systemic disease.                       - After reviewing the risks and benefits, the patient                        was deemed in satisfactory condition to undergo the                        procedure.                       After obtaining informed consent, the endoscope was                        passed under direct vision. Throughout the procedure,                        the patient's blood pressure, pulse, and oxygen                        saturations were monitored continuously. The                        Colonoscope was introduced through the mouth and                        advanced to the proximal jejunum. The small bowel                        enteroscopy was accomplished with ease. The patient                        tolerated the procedure well. Findings:      LA Grade B (one or more mucosal breaks greater than 5 mm, not extending       between the tops of two mucosal folds) esophagitis with no bleeding was       found.      Grade I varices were found in the distal esophagus. They were small in       size. No stigmata of recent or active bleeding.      There is no endoscopic evidence of bleeding in the entire esophagus.      Patchy atrophic mucosa was found in the gastric antrum. Biopsies were       not done due to patient's recent bleeding.      There is no endoscopic evidence of bleeding or ulceration in the entire       examined stomach.      There was no evidence of  significant pathology in the entire examined       duodenum.      There was no evidence of significant pathology in the entire examined       portion of jejunum. Impression:           - LA Grade B reflux esophagitis.                       - Grade I esophageal varices.                       - Gastric mucosal atrophy.                       - Normal examined duodenum.                       - The examined portion of the jejunum was normal.                       - No specimens  collected.                       - No old or new blood, and no actively bleeding                        lesions, no stigmata or recent bleeding present                        throughout the exam. Recommendation:       - Return patient to hospital ward for ongoing care.                       - Low sodium diet.                       - Perform an H. pylori stool antigen (HpSA) test today.                       - Follow an antireflux regimen.                       - Use Prilosec (omeprazole) 20 mg PO BID for 4 weeks                       - Check Iron and ferritin level and transfuse IV iron                        if low                       Continue Epogen with dialysis                       - Give a beta blocker with dosage titrated by the heart                        rate. Start Propanolol or Nadolol at 20 mg daily which                        can be titrated up as an outpatient.                       -  Return to [facility] in 2 weeks.                       - Avoid NSAIDs except Aspirin if medically indicated                       - Continue Serial CBCs and transfuse PRN Procedure Code(s):    --- Professional ---                       [phone], Small intestinal endoscopy, enteroscopy beyond                        second portion of duodenum, not including ileum;                        diagnostic, including collection of specimen(s) by                        brushing or washing, when performed (separate procedure) Diagnosis Code(s):    --- Professional ---                       K21.0, Gastro-esophageal reflux disease with esophagitis                       I85.00, Esophageal varices without bleeding                       K31.89, Other diseases of stomach and duodenum                       K92.2, Gastrointestinal hemorrhage, unspecified CPT copyright 2016 American Medical Association. All rights reserved. The codes documented in this report are preliminary and upon coder review may  be  revised to meet current compliance requirements.  [name], MD Margretta Sidle B. Bonna Gains MD, MD 06/05/2017 3:29:09 PM This report has been signed electronically. Number of Addenda: 0 Note Initiated On: 06/05/2017 1:53 PM      [facility]

## 2017-06-05 NOTE — Anesthesia Preprocedure Evaluation (Addendum)
Anesthesia Evaluation  Patient identified by MRN, date of birth, ID band Patient awake    Reviewed: Allergy & Precautions, H&P , NPO status , reviewed documented beta blocker date and time   Airway Mallampati: II  TM Distance: >3 FB     Dental  (+) Poor Dentition, Missing   Pulmonary shortness of breath, COPD, Current Smoker,    Pulmonary exam normal        Cardiovascular hypertension, + CAD, + Peripheral Vascular Disease and +CHF  Normal cardiovascular exam     Neuro/Psych PSYCHIATRIC DISORDERS Anxiety Diabetic neuropathy  Neuromuscular disease    GI/Hepatic PUD, GERD  ,(+) Hepatitis -  Endo/Other  diabetes  Renal/GU DialysisRenal disease     Musculoskeletal   Abdominal   Peds  Hematology  (+) anemia ,   Anesthesia Other Findings Fracture, mandible ([place]) Coronary atherosclerosis of native coronary artery ESRD on dialysis ([place]) Mandible open fracture ([place]) Mandible fracture (HCC) Hyperkalemia Homicidal ideation Suicidal intent Homicidal ideations GIB (gastrointestinal bleeding) HTN (hypertension) GERD (gastroesophageal reflux disease) HLD (hyperlipidemia) Dyspnea Cirrhosis of liver with ascites (HCC) Ascites Symptomatic anemia GI bleed Acute GI bleeding Blood in stool Angiodysplasia of stomach and duodenum with hemorrhage Gastritis Esophagitis, unspecified Upper GI bleed Acute posthemorrhagic anemia Gastrointestinal bleed History of esophagogastroduodenoscopy (EGD) Elevated troponin Alcohol abuse Abdominal pain Anemia due to chronic blood loss Melena Small bowel bleed not requiring more than 4 units of blood in 24 hours, ICU, or surgery Angiodysplasia of small intestine Chronic renal failure Ischemic heart disease Anemia of chronic disease MRSA carrier Rectal bleeding GI bleeding Acute respiratory failure with hypoxia (HCC) Anemia Heme positive stool Ulceration of intestine Benign neoplasm of  transverse colon Acute gastrointestinal hemorrhage Esophageal candidiasis (HCC) Angiodysplasia of intestinal tract COPD exacerbation (HCC) COPD (chronic obstructive pulmonary disease) (HCC) Shortness of breath Hypocalcemia Weakness Hemodialysis patient ([place]) Uremia ESRD (end stage renal disease) ([place]) Alcoholic hepatitis with ascites  Reproductive/Obstetrics                            Anesthesia Physical Anesthesia Plan  ASA: IV  Anesthesia Plan: General   Post-op Pain Management:    Induction:   PONV Risk Score and Plan: 2 and Propofol infusion and TIVA  Airway Management Planned:   Additional Equipment:   Intra-op Plan:   Post-operative Plan:   Informed Consent: I have reviewed the patients History and Physical, chart, labs and discussed the procedure including the risks, benefits and alternatives for the proposed anesthesia with the patient or authorized representative who has indicated his/her understanding and acceptance.   Dental Advisory Given  Plan Discussed with: CRNA  Anesthesia Plan Comments:         Anesthesia Quick Evaluation

## 2017-06-05 NOTE — Progress Notes (Signed)
Per MD okay for RN to place diet orders.

## 2017-06-05 NOTE — Transfer of Care (Signed)
Immediate Anesthesia Transfer of Care Note  Patient: [name]  Procedure(s) Performed: ENTEROSCOPY (N/A )  Patient Location: PACU  Anesthesia Type:General  Level of Consciousness: sedated  Airway & Oxygen Therapy: Patient Spontanous Breathing and Patient connected to nasal cannula oxygen  Post-op Assessment: Report given to RN and Post -op Vital signs reviewed and stable  Post vital signs: Reviewed and stable  Last Vitals:  Vitals Value Taken Time  BP 93/49 06/05/2017  3:20 PM  Temp 37.2 C 06/05/2017  3:20 PM  Pulse 91 06/05/2017  3:29 PM  Resp 27 06/05/2017  3:29 PM  SpO2 92 % 06/05/2017  3:29 PM  Vitals shown include unvalidated device data.  Last Pain:  Vitals:   06/05/17 1520  TempSrc: Tympanic  PainSc: Asleep         Complications: No apparent anesthesia complications

## 2017-06-05 NOTE — Progress Notes (Signed)
Per MD okay for RN to hold bp meds this morning. Pt bp is 100/59

## 2017-06-05 NOTE — Progress Notes (Signed)
VIR staff informed of order for Thrombectomy from IR Tech Work List.

## 2017-06-05 NOTE — Progress Notes (Signed)
[facility] [place] NAME: Stanley [name]    MR#:  [id number]  DATE OF BIRTH:  [birth year]  SUBJECTIVE:   Patient wants to go home he is awaiting his endoscopy  REVIEW OF SYSTEMS:    Review of Systems  Constitutional: Negative for fever, chills weight loss HENT: Negative for ear pain, nosebleeds, congestion, facial swelling, rhinorrhea, neck pain, neck stiffness and ear discharge.   Respiratory: Positive shortness of breath no wheezing or cough  cardiovascular: Negative for chest pain, palpitations and leg swelling.  Gastrointestinal: Negative for heartburn, abdominal pain, vomiting, diarrhea or consitpation Genitourinary: Negative for dysuria, urgency, frequency, hematuria Musculoskeletal: Negative for back pain or joint pain Neurological: Negative for dizziness, seizures, syncope, focal weakness,  numbness and headaches.  Hematological: Does not bruise/bleed easily.  Psychiatric/Behavioral: Negative for hallucinations, confusion, dysphoric mood    Tolerating Diet: yes      DRUG ALLERGIES:  No Known Allergies  VITALS:  Blood pressure 95/62, pulse 89, temperature 98.9 F (37.2 C), temperature source Tympanic, resp. rate 14, height 6\' 3"  (1.905 m), weight 155 lb 14.4 oz (70.7 kg), SpO2 94 %.  PHYSICAL EXAMINATION:  Constitutional: Appears thin and frail. No distress. HENT: Normocephalic. Marland Kitchen Oropharynx is clear and moist.  Eyes: Conjunctivae and EOM are normal. PERRLA, no scleral icterus.  Neck: Normal ROM. Neck supple. No JVD. No tracheal deviation. CVS: RRR, S1/S2 +, 3/6 HSM, no gallops, no carotid bruit.  Pulmonary: Normal respiratory effort with crackles heard left base  abdominal: Slight distention without rebound or guarding  musculoskeletal: Normal range of motion. No edema and no tenderness.  Neuro: Alert. CN 2-12 grossly intact. No focal deficits. Skin: Skin is warm and dry. No rash noted. Psychiatric: Normal mood and  affect.      LABORATORY PANEL:   CBC Recent Labs  Lab 06/05/17 0304 06/05/17 1110  WBC 14.2*  --   HGB 7.1* 7.0*  HCT 23.1*  --   PLT 224  --    ------------------------------------------------------------------------------------------------------------------  Chemistries  Recent Labs  Lab 06/02/17 1352  06/05/17 0304 06/05/17 1110  NA 136   < > 136  --   K 6.4*   < > 4.4  --   CL 100*   < > 99*  --   CO2 26   < > 28  --   GLUCOSE 88   < > 98  --   BUN 55*   < > 34*  --   CREATININE 7.59*   < > 4.79*  --   CALCIUM 8.5*   < > 7.2*  --   AST 38  --   --   --   ALT 37  --   --   --   ALKPHOS 172*  --   --   --   BILITOT 0.5  --   --  0.5   < > = values in this interval not displayed.   ------------------------------------------------------------------------------------------------------------------  Cardiac Enzymes No results for input(s): TROPONINI in the last 168 hours. ------------------------------------------------------------------------------------------------------------------  RADIOLOGY:  Ct Angio Chest Pe W Or Wo Contrast  Result Date: 06/03/2017 CLINICAL DATA:  Hypoxia EXAM: CT ANGIOGRAPHY CHEST WITH CONTRAST TECHNIQUE: Multidetector CT imaging of the chest was performed using the standard protocol during bolus administration of intravenous contrast. Multiplanar CT image reconstructions and MIPs were obtained to evaluate the vascular anatomy. CONTRAST:  76mL ISOVUE-370 IOPAMIDOL (ISOVUE-370) INJECTION 76% COMPARISON:  Plain film from 06/03/2017, CT from 03/20/16 FINDINGS: Cardiovascular:  Thoracic aorta and its branches demonstrate atherosclerotic calcifications without aneurysmal dilatation or dissection. No significant cardiac enlargement is noted. Heavy coronary calcifications are seen. The pulmonary artery is well visualized with a normal branching pattern. No filling defects to suggest pulmonary emboli are identified. Mediastinum/Nodes: Thoracic inlet is  within normal limits. No significant hilar or mediastinal adenopathy is identified. The esophagus is within normal limits. Lungs/Pleura: Emphysematous changes are noted in the lungs bilaterally. The right lung is clear with the exception of some alveolar infiltrate in the lower lobe. More marked alveolar infiltrate is noted in the left lower lobe with areas of consolidation. These changes are also noted but to a slightly lesser degree in the left upper lobe. These have increased in the interval from the prior plain film examination today. Upper Abdomen: Visualized upper abdomen demonstrates cirrhotic change of the liver with moderate ascites. No other focal abnormality in the upper abdomen is seen. No evidence of varices are noted. Musculoskeletal: Degenerative changes of the thoracic spine are noted. Old right rib fractures are noted. Review of the MIP images confirms the above findings. IMPRESSION: Diffuse left-sided infiltrate slightly greater in the lower lobe than the upper lobe with minimal infiltrative changes in the right lower lobe. Given the abrupt change from the recent plain film this likely represents some asymmetric pulmonary edema. Clinical correlation is recommended. Cirrhotic change with evidence of moderate ascites. No evidence of pulmonary embolism. Aortic Atherosclerosis (ICD10-I70.0) and Emphysema (ICD10-J43.9). Electronically Signed   By: Inez Catalina M.D.   On: 06/03/2017 17:45   US Paracentesis  Result Date: 06/05/2017 INDICATION: Ascites EXAM: ULTRASOUND GUIDED THERAPEUTIC PARACENTESIS MEDICATIONS: None. COMPLICATIONS: None immediate. PROCEDURE: Informed written consent was obtained from the patient after a discussion of the risks, benefits and alternatives to treatment. A timeout was performed prior to the initiation of the procedure. Initial ultrasound scanning demonstrates a large amount of ascites within the right lower abdominal quadrant. The right lower abdomen was prepped and  draped in the usual sterile fashion. 1% lidocaine was used for local anesthesia. Following this, a 6 Fr Safe-T-Centesis catheter was introduced. An ultrasound image was saved for documentation purposes. The paracentesis was performed. The catheter was removed and a dressing was applied. The patient tolerated the procedure well without immediate post procedural complication. FINDINGS: A total of approximately 3.3 L of clear yellow fluid was removed. Samples were sent to the laboratory as requested by the clinical team. IMPRESSION: Successful ultrasound-guided paracentesis yielding 3.3 liters of peritoneal fluid. Electronically Signed   By: Inez Catalina M.D.   On: 06/05/2017 11:50     ASSESSMENT AND PLAN:   57 year old male with end-stage renal disease on hemodialysis, alcoholic cirrhosis, severe pulmonary hypertension with moderate to severe MR, chronic diastolic heart failure with preserved ejection fraction and CAD who presents with hematemesis  1.  Upper GI bleed: Endoscopy shows LA grade 2 reflux esophagitis and grade 1 esophageal varices with no recent or active GI bleeding Recommendations to continue PPI  Repeat EGD for today, small bowel cannot be evaluated due to large amount of food in the stomach Avoid NSAIDs except aspirin Continue Rocephin in the setting of ascites and GI bleed   2.  Acute on chronic blood loss anemia due to LAD grade 2 esophagitis Patient is status post 2 unit PRBCs Hemoglobin remains relatively stable Anemia panel consistent with chronic disease Continue Epogen with dialysis Repeat cbc in am  3.  [name] with hyperkalemia: Patient underwent emergent dialysis on  admission and will require dialysis today since he received IV contrast yesterday for CT scan and chest x-ray showing pulmonary edema  Normal schedule of dialysis Monday, Wednesday and Friday   4.  Acute hypoxic respiratory failure due to acute on chronic diastolic heart  failure with preserved ejection fraction without signs of exacerbation Patient will have dialysis today.   Case discussed with Dr. [name]  Wean oxygen to room air no ACEI/ARB given ESRD as per his cardiologist  5.  CAD/CABG: Continue atorvastatin and ASA ok to start as per GI   6.  Essential hypertension: Continue labetalol   7. Severe MR/pulmonary HTM: Follows with Cardiology Cardiology is Considering Metoprolol in place of labetolol, but will defer this to his outpatient cardioloigst  8. Liver cirrhosis: GI is recommending paracentesis. May plan for tomorrow since he is getting HD today Patient does not feel like he has a lot of fluid build up in abdomen  Management plans discussed with the patient and he is in agreement.  CODE STATUS: FULL  TOTAL TIME TAKING CARE OF THIS PATIENT: 24 minutes.   D/w dr [name]  POSSIBLE D/C 2-3 days DEPENDING ON CLINICAL CONDITION.   Dustin Flock M.D on 06/05/2017 at 4:06 PM  Between 7am to 6pm - Pager - [phone] After 6pm go to [email or web address] - password [email or web address] [facility]  Office  [phone]  CC: Primary care physician; [name], MD  Note: This dictation was prepared with Dragon dictation along with smaller phrase technology. Any transcriptional errors that result from this process are unintentional.

## 2017-06-05 NOTE — Progress Notes (Signed)
Stanley Antigua, MD [address] [place], [place], [place], [address] [address], [place], [place], [place], [address] Phone: [phone]  Fax: [phone]   Subjective: Patient tolerated  Clear liquid yesterday.  No episodes of emesis. No acute events overnight.   Objective: Vital signs in last 24 hours: Vitals:   06/04/17 1715 06/04/17 1730 06/04/17 2007 06/05/17 0547  BP: 109/70 (!) 110/59 (!) 103/54 108/64  Pulse: (!) 102 (!) 105 (!) 101 95  Resp: (!) 25 (!) 23 18 18   Temp: 98.2 F (36.8 C)   98.2 F (36.8 C)  TempSrc: Oral   Oral  SpO2: 100% 97% 92% 93%  Weight:      Height:       Weight change:   Intake/Output Summary (Last 24 hours) at 06/05/2017 5035 Last data filed at 06/04/2017 2000 Gross per 24 hour  Intake 660 ml  Output 4013 ml  Net -3353 ml     Exam: Pulm: CTA b/l, Normal Resp Effort Abd: Soft, NT/ND, No HSM Skin: Warm, no rashes Neck: Supple, Trachea midline   Lab Results: Lab Results  Component Value Date   WBC 14.2 (H) 06/05/2017   HGB 7.1 (L) 06/05/2017   HCT 23.1 (L) 06/05/2017   MCV 86.4 06/05/2017   PLT 224 06/05/2017   Micro Results: Recent Results (from the past 240 hour(s))  MRSA PCR Screening     Status: None   Collection Time: 06/02/17  4:22 PM  Result Value Ref Range Status   MRSA by PCR NEGATIVE NEGATIVE Final    Comment:        The GeneXpert MRSA Assay (FDA approved for NASAL specimens only), is one component of a comprehensive MRSA colonization surveillance program. It is not intended to diagnose MRSA infection nor to guide or monitor treatment for MRSA infections. Performed at [facility], [address] [place],  [address]    Studies/Results: Ct Angio Chest Pe W Or Wo Contrast  Result Date: 06/03/2017 CLINICAL DATA:  Hypoxia EXAM: CT ANGIOGRAPHY CHEST WITH CONTRAST TECHNIQUE: Multidetector CT imaging of the chest was performed using the standard protocol during bolus  administration of intravenous contrast. Multiplanar CT image reconstructions and MIPs were obtained to evaluate the vascular anatomy. CONTRAST:  73mL ISOVUE-370 IOPAMIDOL (ISOVUE-370) INJECTION 76% COMPARISON:  Plain film from 06/03/2017, CT from 03/20/16 FINDINGS: Cardiovascular: Thoracic aorta and its branches demonstrate atherosclerotic calcifications without aneurysmal dilatation or dissection. No significant cardiac enlargement is noted. Heavy coronary calcifications are seen. The pulmonary artery is well visualized with a normal branching pattern. No filling defects to suggest pulmonary emboli are identified. Mediastinum/Nodes: Thoracic inlet is within normal limits. No significant hilar or mediastinal adenopathy is identified. The esophagus is within normal limits. Lungs/Pleura: Emphysematous changes are noted in the lungs bilaterally. The right lung is clear with the exception of some alveolar infiltrate in the lower lobe. More marked alveolar infiltrate is noted in the left lower lobe with areas of consolidation. These changes are also noted but to a slightly lesser degree in the left upper lobe. These have increased in the interval from the prior plain film examination today. Upper Abdomen: Visualized upper abdomen demonstrates cirrhotic change of the liver with moderate ascites. No other focal abnormality in the upper abdomen is seen. No evidence of varices are noted. Musculoskeletal: Degenerative changes of the thoracic spine are noted. Old right rib fractures are noted. Review of the MIP images confirms the above findings. IMPRESSION: Diffuse left-sided infiltrate slightly greater in the  lower lobe than the upper lobe with minimal infiltrative changes in the right lower lobe. Given the abrupt change from the recent plain film this likely represents some asymmetric pulmonary edema. Clinical correlation is recommended. Cirrhotic change with evidence of moderate ascites. No evidence of pulmonary embolism.  Aortic Atherosclerosis (ICD10-I70.0) and Emphysema (ICD10-J43.9). Electronically Signed   By: Inez Catalina M.D.   On: 06/03/2017 17:45   Dg Chest Port 1 View  Result Date: 06/03/2017 CLINICAL DATA:  Vomiting. EXAM: PORTABLE CHEST 1 VIEW COMPARISON:  May 17, 2017 FINDINGS: Cardiomegaly, stable. The hila and mediastinum are unchanged. No pneumothorax. No pulmonary nodules or masses. No focal infiltrates. IMPRESSION: No active disease. Electronically Signed   By: Dorise Bullion III M.D   On: 06/03/2017 11:40   Medications:  Scheduled Meds: . aspirin  81 mg Oral Daily  . atorvastatin  40 mg Oral Daily  . feeding supplement (NEPRO CARB STEADY)  237 mL Oral TID BM  . furosemide  20 mg Intravenous Once  . furosemide  80 mg Oral Daily  . gabapentin  100 mg Oral BID  . insulin aspart  0-9 Units Subcutaneous Q6H  . labetalol  100 mg Oral Daily  . mometasone-formoterol  2 puff Inhalation BID  . multivitamin  1 tablet Oral QHS  . pantoprazole (PROTONIX) IV  40 mg Intravenous Q12H  . sodium chloride flush  3 mL Intravenous Q12H  . tiotropium  18 mcg Inhalation Daily  . vitamin C  500 mg Oral Daily   Continuous Infusions: . sodium chloride    . sodium chloride    . cefTRIAXone (ROCEPHIN)  IV Stopped (06/04/17 1047)   PRN Meds:.sodium chloride, acetaminophen **OR** acetaminophen, albuterol, diphenhydrAMINE, morphine injection, nitroGLYCERIN, ondansetron **OR** ondansetron (ZOFRAN) IV, phenol, sodium chloride flush   Assessment: Active Problems:   GIB (gastrointestinal bleeding)   Reflux esophagitis   Idiopathic esophageal varices without bleeding ([place])    Plan: No further episodes of active GI bleeding EGD found grade B esophagitis on procedure 2 days ago. Stomach showed large amount of solid food, with no old or new blood present throughout the exam  Repeat procedure planned this week. Patient is pending paracentesis today, and possible fistulogram We will plan on timing of  procedure based on timing of other planned interventions, and when at this clinically optimal to proceed with procedures. He has been on clear liquid diet all of yesterday.  This would be the ideal time to do it  as far as having an empty stomach, before he is restarted on any solid foods However, there is no further episodes of active GI bleeding, and thus the procedure is not emergent at this time.  Continue n.p.o.  Continue serial CBCs and transfuse PRN Continue Protonix twice daily Avoid NSAIDs Please obtain ferritin and iron panel and transfuse PRN if indicated  Please order diagnostic paracentesis labs along with paracentesis ordered for today.  Obtain fluid cell count, fluid cytology, fluid albumin  Patient to follow-up with [facility] GI on discharge   LOS: 3 days   Stanley Antigua, MD 06/05/2017, 9:38 AM

## 2017-06-05 NOTE — Anesthesia Post-op Follow-up Note (Signed)
Anesthesia QCDR form completed.        

## 2017-06-06 ENCOUNTER — Inpatient Hospital Stay
Admission: EM | Discharge status: Against Medical Advice | Payer: Self-pay | Source: Home / Self Care | Attending: Internal Medicine

## 2017-06-06 ENCOUNTER — Encounter: Payer: Self-pay | Admitting: Gastroenterology

## 2017-06-06 DIAGNOSIS — I2510 Atherosclerotic heart disease of native coronary artery without angina pectoris: Secondary | ICD-10-CM

## 2017-06-06 DIAGNOSIS — K652 Spontaneous bacterial peritonitis: Secondary | ICD-10-CM

## 2017-06-06 DIAGNOSIS — I10 Essential (primary) hypertension: Secondary | ICD-10-CM

## 2017-06-06 DIAGNOSIS — E1122 Type 2 diabetes mellitus with diabetic chronic kidney disease: Secondary | ICD-10-CM

## 2017-06-06 DIAGNOSIS — N186 End stage renal disease: Secondary | ICD-10-CM

## 2017-06-06 HISTORY — PX: A/V FISTULAGRAM: CATH118298

## 2017-06-06 HISTORY — PX: A/V SHUNT INTERVENTION: CATH118220

## 2017-06-06 LAB — CBC
HEMATOCRIT: 25.4 % — AB (ref 40.0–52.0)
HEMOGLOBIN: 7.9 g/dL — AB (ref 13.0–18.0)
MCH: 27 pg (ref 26.0–34.0)
MCHC: 31.2 g/dL — AB (ref 32.0–36.0)
MCV: 86.4 fL (ref 80.0–100.0)
Platelets: 261 10*3/uL (ref 150–440)
RBC: 2.94 MIL/uL — ABNORMAL LOW (ref 4.40–5.90)
RDW: 17.2 % — ABNORMAL HIGH (ref 11.5–14.5)
WBC: 11 10*3/uL — ABNORMAL HIGH (ref 3.8–10.6)

## 2017-06-06 LAB — HEMOGLOBIN
HEMOGLOBIN: 8.9 g/dL — AB (ref 13.0–18.0)
Hemoglobin: 7.6 g/dL — ABNORMAL LOW (ref 13.0–18.0)
Hemoglobin: 7.7 g/dL — ABNORMAL LOW (ref 13.0–18.0)

## 2017-06-06 LAB — GLUCOSE, CAPILLARY
GLUCOSE-CAPILLARY: 82 mg/dL (ref 65–99)
GLUCOSE-CAPILLARY: 89 mg/dL (ref 65–99)

## 2017-06-06 LAB — PH, BODY FLUID: pH, Body Fluid: 7.4

## 2017-06-06 SURGERY — A/V FISTULAGRAM
Anesthesia: Moderate Sedation | Laterality: Right

## 2017-06-06 MED ORDER — CEFAZOLIN SODIUM-DEXTROSE 1-4 GM/50ML-% IV SOLN
INTRAVENOUS | Status: AC
  Filled 2017-06-06: qty 50

## 2017-06-06 MED ORDER — INSULIN ASPART 100 UNIT/ML ~~LOC~~ SOLN: 0.0000 [IU] | Freq: Three times a day (TID) | SUBCUTANEOUS | Status: DC

## 2017-06-06 MED ORDER — FENTANYL CITRATE (PF) 100 MCG/2ML IJ SOLN
INTRAMUSCULAR | Status: AC
  Filled 2017-06-06: qty 2

## 2017-06-06 MED ORDER — HEPARIN SODIUM (PORCINE) 10000 UNIT/ML IJ SOLN
INTRAMUSCULAR | Status: AC
  Filled 2017-06-06: qty 1

## 2017-06-06 MED ORDER — MIDAZOLAM HCL 2 MG/2ML IJ SOLN
INTRAMUSCULAR | Status: DC | PRN
  Administered 2017-06-06: 2 mg via INTRAVENOUS
  Administered 2017-06-06 (×3): 1 mg via INTRAVENOUS

## 2017-06-06 MED ORDER — LIDOCAINE-EPINEPHRINE (PF) 1 %-1:200000 IJ SOLN
INTRAMUSCULAR | Status: AC
  Filled 2017-06-06: qty 30

## 2017-06-06 MED ORDER — HEPARIN (PORCINE) IN NACL 2-0.9 UNIT/ML-% IJ SOLN
INTRAMUSCULAR | Status: AC
  Filled 2017-06-06: qty 1000

## 2017-06-06 MED ORDER — LIDOCAINE HCL (PF) 1 % IJ SOLN
INTRAMUSCULAR | Status: AC
  Filled 2017-06-06: qty 30

## 2017-06-06 MED ORDER — CEFAZOLIN SODIUM-DEXTROSE 1-4 GM/50ML-% IV SOLN
1.0000 g | Freq: Once | INTRAVENOUS | Status: AC
  Administered 2017-06-06: 1 g via INTRAVENOUS

## 2017-06-06 MED ORDER — HEPARIN SODIUM (PORCINE) 1000 UNIT/ML IJ SOLN
INTRAMUSCULAR | Status: AC
  Filled 2017-06-06: qty 1

## 2017-06-06 MED ORDER — FENTANYL CITRATE (PF) 100 MCG/2ML IJ SOLN
INTRAMUSCULAR | Status: DC | PRN
  Administered 2017-06-06 (×2): 25 ug via INTRAVENOUS
  Administered 2017-06-06: 50 ug via INTRAVENOUS
  Administered 2017-06-06 (×2): 25 ug via INTRAVENOUS
  Administered 2017-06-06: 50 ug via INTRAVENOUS

## 2017-06-06 MED ORDER — MIDAZOLAM HCL 5 MG/5ML IJ SOLN
INTRAMUSCULAR | Status: AC
  Filled 2017-06-06: qty 5

## 2017-06-06 SURGICAL SUPPLY — 15 items
CANNULA 5F STIFF (CANNULA) ×4 IMPLANT
CATH BEACON 5 .035 65 KMP TIP (CATHETERS) ×4 IMPLANT
CATH PALINDROME RT-P 15FX19CM (CATHETERS) ×4 IMPLANT
COVER PROBE U/S 5X48 (MISCELLANEOUS) ×4 IMPLANT
DEVICE TORQUE (MISCELLANEOUS) ×4 IMPLANT
DRAPE BRACHIAL (DRAPES) ×4 IMPLANT
DRAPE INCISE IOBAN 66X45 STRL (DRAPES) ×4 IMPLANT
GLIDEWIRE .035X150 LONG TAPER (WIRE) ×4 IMPLANT
NEEDLE ENTRY 21GA 7CM ECHOTIP (NEEDLE) ×4 IMPLANT
PACK ANGIOGRAPHY (CUSTOM PROCEDURE TRAY) ×4 IMPLANT
SET INTRO CAPELLA COAXIAL (SET/KITS/TRAYS/PACK) ×8 IMPLANT
SHEATH BRITE TIP 6FRX5.5 (SHEATH) ×8 IMPLANT
SHEATH MICROPUNCTURE PEDAL 4FR (SHEATH) ×4 IMPLANT
SUT MNCRL AB 4-0 PS2 18 (SUTURE) ×12 IMPLANT
SUT SILK 0 FSL (SUTURE) ×4 IMPLANT

## 2017-06-06 NOTE — Progress Notes (Signed)
[facility] Admission History & Physical  MRN : [id number]  Stanley [name] is a 57 y.o. (12-May-1960) male who presents with chief complaint of  Chief Complaint  Patient presents with  . Requesting Dialysis Treatment  .  History of Present Illness:The patient was sent here because they were unable to cannulate the right arm AV access this past weekend. Furthermore the Nephrology service states there is no thrill or bruit.  This problem is chronic in onset he has been scheduled for a fistulogram several times but has been noncompliant with this plan.  The patient is unaware of any other change.  Patient denies pain or tenderness overlying the access.  There is no pain with dialysis.  The patient denies hand pain or finger pain consistent with steal syndrome.   There have been past interventions but no declots of this access.  The patient is not chronically hypotensive on dialysis.  Current Facility-Administered Medications  Medication Dose Route Frequency Provider Last Rate Last Dose  . ceFAZolin (ANCEF) 1-4 GM/50ML-% IVPB           . [MAR Hold] 0.9 %  sodium chloride infusion  10 mL/hr Intravenous Once Nena Polio, MD      . [facility] Hold] 0.9 %  sodium chloride infusion  250 mL Intravenous PRN Demetrios Loll, MD      . [facility] Hold] acetaminophen (TYLENOL) tablet 650 mg  650 mg Oral Q6H PRN Demetrios Loll, MD   650 mg at 06/02/17 1923   Or  . [MAR Hold] acetaminophen (TYLENOL) suppository 650 mg  650 mg Rectal Q6H PRN Demetrios Loll, MD      . [facility] Hold] albuterol (PROVENTIL) (2.5 MG/3ML) 0.083% nebulizer solution 2.5 mg  2.5 mg Nebulization Q2H PRN Demetrios Loll, MD      . [facility] Hold] aspirin chewable tablet 81 mg  81 mg Oral Daily Mody, Sital, MD   81 mg at 06/04/17 1016  . [MAR Hold] atorvastatin (LIPITOR) tablet 40 mg  40 mg Oral Daily Mody, Sital, MD   40 mg at 06/05/17 1106  . ceFAZolin (ANCEF) IVPB 1 g/50 mL premix  1 g Intravenous Once Augusta Hilbert, Dolores Lory, MD      .  [facility] Hold] cefTRIAXone (ROCEPHIN) 1 g in sodium chloride 0.9 % 100 mL IVPB  1 g Intravenous Q24H Bettey Costa, MD   Stopped at 06/05/17 1148  . [MAR Hold] diphenhydrAMINE (BENADRYL) injection 25 mg  25 mg Intravenous Q6H PRN Lance Coon, MD   25 mg at 06/06/17 1109  . [MAR Hold] feeding supplement (NEPRO CARB STEADY) liquid 237 mL  237 mL Oral TID BM Murlean Iba, MD   Stopped at 06/05/17 2000  . [MAR Hold] furosemide (LASIX) injection 20 mg  20 mg Intravenous Once Bettey Costa, MD      . [facility] Hold] furosemide (LASIX) tablet 80 mg  80 mg Oral Daily Mody, Sital, MD      . [facility] Hold] gabapentin (NEURONTIN) capsule 100 mg  100 mg Oral BID Bettey Costa, MD   100 mg at 06/05/17 2103  . [MAR Hold] insulin aspart (novoLOG) injection 0-9 Units  0-9 Units Subcutaneous Q6H Demetrios Loll, MD   1 Units at 06/02/17 2336  . [MAR Hold] labetalol (NORMODYNE) tablet 100 mg  100 mg Oral Daily Mody, Sital, MD      . [facility] Hold] mometasone-formoterol (DULERA) 200-5 MCG/ACT inhaler 2 puff  2 puff Inhalation BID Demetrios Loll, MD   2 puff at  06/06/17 0818  . [MAR Hold] morphine 4 MG/ML injection 4 mg  4 mg Intravenous Q3H PRN Demetrios Loll, MD   4 mg at 06/06/17 0817  . [MAR Hold] multivitamin (RENA-VIT) tablet 1 tablet  1 tablet Oral QHS Bettey Costa, MD   1 tablet at 06/05/17 2103  . [MAR Hold] nitroGLYCERIN (NITROSTAT) SL tablet 0.4 mg  0.4 mg Sublingual Q5 min PRN Demetrios Loll, MD      . Doug Sou Hold] ondansetron [facility]) tablet 4 mg  4 mg Oral Q6H PRN Demetrios Loll, MD       Or  . Doug Sou Hold] ondansetron [facility]) injection 4 mg  4 mg Intravenous Q6H PRN Demetrios Loll, MD   4 mg at 06/03/17 1627  . [MAR Hold] pantoprazole (PROTONIX) injection 40 mg  40 mg Intravenous Q12H Demetrios Loll, MD   40 mg at 06/06/17 0818  . [MAR Hold] phenol (CHLORASEPTIC) mouth spray 1 spray  1 spray Mouth/Throat PRN Bettey Costa, MD      . [facility] Hold] sodium chloride flush (NS) 0.9 % injection 3 mL  3 mL Intravenous Q12H Demetrios Loll, MD   3 mL at 06/06/17 0819  .  [MAR Hold] sodium chloride flush (NS) 0.9 % injection 3 mL  3 mL Intravenous PRN Demetrios Loll, MD      . Doug Sou Hold] tiotropium [facility]) inhalation capsule 18 mcg  18 mcg Inhalation Daily Demetrios Loll, MD   18 mcg at 06/06/17 0818  . [MAR Hold] traMADol (ULTRAM) tablet 50 mg  50 mg Oral Q8H PRN Dustin Flock, MD      . [facility] Hold] vitamin C (ASCORBIC ACID) tablet 500 mg  500 mg Oral Daily Bettey Costa, MD   500 mg at 06/05/17 1107    Past Medical History:  Diagnosis Date  . Alcohol abuse   . CHF (congestive heart failure) ([place])   . Cirrhosis ([place])   . Coronary artery disease 2009  . Diabetic peripheral neuropathy ([place])   . Drug abuse ([place])   . End stage renal disease on dialysis [facility]) NEPHROLOGIST-   DR [facility]  IN [place]   HEMODIALYSIS --   TUES/  THURS/  SAT  . GERD (gastroesophageal reflux disease)   . Hyperlipidemia   . Hypertension   . PAD (peripheral artery disease) ([place])   . Renal insufficiency    Per pt, 32 oz fluid restriction per day  . S/P triple vessel bypass 06/09/2016   2009ish  . Suicidal ideation    & HOMICIDAL IDEATION --  06-16-2013   ADMITTED TO BEHAVIOR HEALTH    Past Surgical History:  Procedure Laterality Date  . AGILE CAPSULE N/A 06/19/2016   Procedure: AGILE CAPSULE;  Surgeon: [name], MD;  Location: [facility] ENDOSCOPY;  Service: Endoscopy;  Laterality: N/A;  . COLONOSCOPY WITH PROPOFOL N/A 06/18/2016   Procedure: COLONOSCOPY WITH PROPOFOL;  Surgeon: [name], MD;  Location: [facility] ENDOSCOPY;  Service: Endoscopy;  Laterality: N/A;  . COLONOSCOPY WITH PROPOFOL N/A 08/12/2016   Procedure: COLONOSCOPY WITH PROPOFOL;  Surgeon: [name], MD;  Location: [facility] ENDOSCOPY;  Service: Endoscopy;  Laterality: N/A;  . COLONOSCOPY WITH PROPOFOL N/A 05/05/2017   Procedure: COLONOSCOPY WITH PROPOFOL;  Surgeon: Manya Silvas, MD;  Location: [facility] ENDOSCOPY;  Service: Endoscopy;  Laterality: N/A;  . CORONARY ANGIOPLASTY  ?   PT UNABLE TO TELL IF  BEFORE OR AFTER  CABG  .  CORONARY ARTERY BYPASS GRAFT  2008  ([place] , [place])   3 VESSEL  . DIALYSIS  FISTULA CREATION  LAST SURGERY  APPOX  2008  . ENTEROSCOPY N/A 05/10/2016   Procedure: ENTEROSCOPY;  Surgeon: Jerene Bears, MD;  Location: [place];  Service: Gastroenterology;  Laterality: N/A;  . ENTEROSCOPY N/A 08/12/2016   Procedure: ENTEROSCOPY;  Surgeon: [name], MD;  Location: [facility] ENDOSCOPY;  Service: Endoscopy;  Laterality: N/A;  . ENTEROSCOPY Left 06/03/2017   Procedure: ENTEROSCOPY;  Surgeon: Virgel Manifold, MD;  Location: [facility] ENDOSCOPY;  Service: Endoscopy;  Laterality: Left;  Procedure date will ultimately depend on when patient is medically optimized before the procedure, pending hemodialysis and blood transfusions etc. Will place on schedule and change depending on clinical status.   . ENTEROSCOPY N/A 06/05/2017   Procedure: ENTEROSCOPY;  Surgeon: Virgel Manifold, MD;  Location: [facility] ENDOSCOPY;  Service: Endoscopy;  Laterality: N/A;  . ESOPHAGOGASTRODUODENOSCOPY N/A 05/07/2015   Procedure: ESOPHAGOGASTRODUODENOSCOPY (EGD);  Surgeon: Hulen Luster, MD;  Location: [facility] ENDOSCOPY;  Service: Endoscopy;  Laterality: N/A;  . ESOPHAGOGASTRODUODENOSCOPY (EGD) WITH PROPOFOL N/A 05/17/2015   Procedure: ESOPHAGOGASTRODUODENOSCOPY (EGD) WITH PROPOFOL;  Surgeon: [name], MD;  Location: [facility] ENDOSCOPY;  Service: Endoscopy;  Laterality: N/A;  . ESOPHAGOGASTRODUODENOSCOPY (EGD) WITH PROPOFOL N/A 01/20/2016   Procedure: ESOPHAGOGASTRODUODENOSCOPY (EGD) WITH PROPOFOL;  Surgeon: [name], MD;  Location: [facility] ENDOSCOPY;  Service: Endoscopy;  Laterality: N/A;  . ESOPHAGOGASTRODUODENOSCOPY (EGD) WITH PROPOFOL N/A 04/17/2016   Procedure: ESOPHAGOGASTRODUODENOSCOPY (EGD) WITH PROPOFOL;  Surgeon: Lin Landsman, MD;  Location: [facility] ENDOSCOPY;  Service: Gastroenterology;  Laterality: N/A;  . ESOPHAGOGASTRODUODENOSCOPY (EGD) WITH PROPOFOL  05/09/2016   Procedure: ESOPHAGOGASTRODUODENOSCOPY (EGD) WITH PROPOFOL;  Surgeon:  Jerene Bears, MD;  Location: [place];  Service: Endoscopy;;  . ESOPHAGOGASTRODUODENOSCOPY (EGD) WITH PROPOFOL N/A 06/16/2016   Procedure: ESOPHAGOGASTRODUODENOSCOPY (EGD) WITH PROPOFOL;  Surgeon: [name], MD;  Location: [facility] ENDOSCOPY;  Service: Endoscopy;  Laterality: N/A;  . ESOPHAGOGASTRODUODENOSCOPY (EGD) WITH PROPOFOL N/A 05/05/2017   Procedure: ESOPHAGOGASTRODUODENOSCOPY (EGD) WITH PROPOFOL;  Surgeon: Manya Silvas, MD;  Location: [facility] ENDOSCOPY;  Service: Endoscopy;  Laterality: N/A;  . GIVENS CAPSULE STUDY N/A 05/07/2016   Procedure: GIVENS CAPSULE STUDY;  Surgeon: Doran Stabler, MD;  Location: [place];  Service: Endoscopy;  Laterality: N/A;  . MANDIBULAR HARDWARE REMOVAL N/A 07/29/2013   Procedure: REMOVAL OF ARCH BARS;  Surgeon: Theodoro Kos, DO;  Location: [place];  Service: Plastics;  Laterality: N/A;  . ORIF MANDIBULAR FRACTURE N/A 06/05/2013   Procedure: REPAIR OF MANDIBULAR FRACTURE x 2 with maxillo-mandibular fixation ;  Surgeon: Theodoro Kos, DO;  Location: [place];  Service: Plastics;  Laterality: N/A;  . PERIPHERAL ARTERIAL STENT GRAFT Left     Social History Social History   Tobacco Use  . Smoking status: Current Every Day Smoker    Packs/day: 0.15    Years: 40.00    Pack years: 6.00    Types: Cigarettes  . Smokeless tobacco: Never Used  Substance Use Topics  . Alcohol use: No    Comment: pt reports quitting after learning about cirrhosis  . Drug use: No    Frequency: 7.0 times per week    Types: Marijuana, Cocaine    Family History Family History  Problem Relation Age of Onset  . Colon cancer Mother   . Cancer Father   . Cancer Sister   . Kidney disease Brother     No family history of bleeding or clotting disorders, autoimmune disease or porphyria  No Known Allergies   REVIEW OF SYSTEMS (Negative unless checked)  Constitutional: []   Weight loss  [] Fever  [] Chills Cardiac: [] Chest pain   [] Chest pressure    [] Palpitations   [] Shortness of breath when laying flat   [] Shortness of breath at rest   [x] Shortness of breath with exertion. Vascular:  [] Pain in legs with walking   [] Pain in legs at rest   [] Pain in legs when laying flat   [] Claudication   [] Pain in feet when walking  [] Pain in feet at rest  [] Pain in feet when laying flat   [] History of DVT   [] Phlebitis   [] Swelling in legs   [] Varicose veins   [] Non-healing ulcers Pulmonary:   [] Uses home oxygen   [] Productive cough   [] Hemoptysis   [] Wheeze  [] COPD   [] Asthma Neurologic:  [] Dizziness  [] Blackouts   [] Seizures   [] History of stroke   [] History of TIA  [] Aphasia   [] Temporary blindness   [] Dysphagia   [] Weakness or numbness in arms   [] Weakness or numbness in legs Musculoskeletal:  [] Arthritis   [] Joint swelling   [] Joint pain   [] Low back pain Hematologic:  [] Easy bruising  [] Easy bleeding   [] Hypercoagulable state   [] Anemic  [] Hepatitis Gastrointestinal:  [] Blood in stool   [] Vomiting blood  [] Gastroesophageal reflux/heartburn   [] Difficulty swallowing. Genitourinary:  [x] Chronic kidney disease   [] Difficult urination  [] Frequent urination  [] Burning with urination   [] Blood in urine Skin:  [] Rashes   [] Ulcers   [] Wounds Psychological:  [] History of anxiety   []  History of major depression.  Physical Examination  Vitals:   06/06/17 1400 06/06/17 1413 06/06/17 1437 06/06/17 1451  BP: 120/65 120/61 117/67 124/70  Pulse: 94 93 95 96  Resp: 18 20  20   Temp:  98.3 F (36.8 C) 98.4 F (36.9 C) 98.3 F (36.8 C)  TempSrc:  Oral Oral Oral  SpO2: 100% 100% 94%   Weight:  157 lb 10.1 oz (71.5 kg)  157 lb (71.2 kg)  Height:    6\' 3"  (1.905 m)   Body mass index is 19.62 kg/m. Gen: WD/WN, NAD Head: [place]/AT, No temporalis wasting. Prominent temp pulse not noted. Ear/Nose/Throat: Hearing grossly intact, nares w/o erythema or drainage, oropharynx w/o Erythema/Exudate,  Eyes: Conjunctiva clear, sclera non-icteric Neck: Trachea midline.  No  JVD.  Pulmonary:  Good air movement, respirations not labored, no use of accessory muscles.  Cardiac: RRR, normal S1, S2. Vascular: Right upper arm AV fistula no thrill no bruit Vessel Right Left  Radial Palpable Palpable  Ulnar Not Palpable Not Palpable  Brachial Palpable Palpable  Carotid Palpable, without bruit Palpable, without bruit  Gastrointestinal: soft, non-tender/distended and tight. No guarding/reflex.  Musculoskeletal: M/S 5/5 throughout.  Extremities without ischemic changes.  No deformity or atrophy.  Neurologic: Sensation grossly intact in extremities.  Symmetrical.  Speech is fluent. Motor exam as listed above. Psychiatric: Judgment intact, Mood & affect appropriate for pt's clinical situation. Dermatologic: No rashes or ulcers noted.  No cellulitis or open wounds. Lymph : No Cervical, Axillary, or Inguinal lymphadenopathy.   CBC Lab Results  Component Value Date   WBC 11.0 (H) 06/06/2017   HGB 7.6 (L) 06/06/2017   HCT 25.4 (L) 06/06/2017   MCV 86.4 06/06/2017   PLT 261 06/06/2017    BMET    Component Value Date/Time   NA 136 06/05/2017 0304   NA 143 06/10/2014 1251   K 4.4 06/05/2017 0304   K 5.1 06/10/2014 1251   CL 99 (L) 06/05/2017 0304   CL 102 06/10/2014 1251   CO2  28 06/05/2017 0304   CO2 30 06/10/2014 1251   GLUCOSE 98 06/05/2017 0304   GLUCOSE 126 (H) 06/10/2014 1251   BUN 34 (H) 06/05/2017 0304   BUN 20 06/10/2014 1251   CREATININE 4.79 (H) 06/05/2017 0304   CREATININE 5.42 (H) 06/10/2014 1251   CALCIUM 7.2 (L) 06/05/2017 0304   CALCIUM 8.9 06/10/2014 1251   GFRNONAA 12 (L) 06/05/2017 0304   GFRNONAA 11 (L) 06/10/2014 1251   GFRAA 14 (L) 06/05/2017 0304   GFRAA 13 (L) 06/10/2014 1251   Estimated Creatinine Clearance: 16.9 mL/min (A) (by C-G formula based on SCr of 4.79 mg/dL (H)).  COAG Lab Results  Component Value Date   INR 1.08 05/25/2017   INR 1.14 05/03/2017   INR 1.03 03/24/2017    Radiology Dg Chest 2 View  Result Date:  05/17/2017 CLINICAL DATA:  Mid chest pain and shortness of breath for the past 6 days. The patient has missed several dialysis episodes. History of CHF, coronary artery disease, and peripheral vascular disease. Previous CABG. EXAM: CHEST - 2 VIEW COMPARISON:  Chest x-ray of May 03, 2017 FINDINGS: The lungs are well-expanded. The interstitial markings are increased. The pulmonary vascularity is engorged. The cardiac silhouette is mildly enlarged. There are post CABG changes. There is calcification in the wall of the thoracic aorta. A trace of pleural fluid blunts the posterior costophrenic angles. IMPRESSION: Findings compatible with low-grade CHF.  No alveolar pneumonia. Thoracic aortic atherosclerosis. Electronically Signed   By: David  Martinique M.D.   On: 05/17/2017 12:33   Ct Angio Chest Pe W Or Wo Contrast  Result Date: 06/03/2017 CLINICAL DATA:  Hypoxia EXAM: CT ANGIOGRAPHY CHEST WITH CONTRAST TECHNIQUE: Multidetector CT imaging of the chest was performed using the standard protocol during bolus administration of intravenous contrast. Multiplanar CT image reconstructions and MIPs were obtained to evaluate the vascular anatomy. CONTRAST:  74mL ISOVUE-370 IOPAMIDOL (ISOVUE-370) INJECTION 76% COMPARISON:  Plain film from 06/03/2017, CT from 03/20/16 FINDINGS: Cardiovascular: Thoracic aorta and its branches demonstrate atherosclerotic calcifications without aneurysmal dilatation or dissection. No significant cardiac enlargement is noted. Heavy coronary calcifications are seen. The pulmonary artery is well visualized with a normal branching pattern. No filling defects to suggest pulmonary emboli are identified. Mediastinum/Nodes: Thoracic inlet is within normal limits. No significant hilar or mediastinal adenopathy is identified. The esophagus is within normal limits. Lungs/Pleura: Emphysematous changes are noted in the lungs bilaterally. The right lung is clear with the exception of some alveolar infiltrate  in the lower lobe. More marked alveolar infiltrate is noted in the left lower lobe with areas of consolidation. These changes are also noted but to a slightly lesser degree in the left upper lobe. These have increased in the interval from the prior plain film examination today. Upper Abdomen: Visualized upper abdomen demonstrates cirrhotic change of the liver with moderate ascites. No other focal abnormality in the upper abdomen is seen. No evidence of varices are noted. Musculoskeletal: Degenerative changes of the thoracic spine are noted. Old right rib fractures are noted. Review of the MIP images confirms the above findings. IMPRESSION: Diffuse left-sided infiltrate slightly greater in the lower lobe than the upper lobe with minimal infiltrative changes in the right lower lobe. Given the abrupt change from the recent plain film this likely represents some asymmetric pulmonary edema. Clinical correlation is recommended. Cirrhotic change with evidence of moderate ascites. No evidence of pulmonary embolism. Aortic Atherosclerosis (ICD10-I70.0) and Emphysema (ICD10-J43.9). Electronically Signed   By: Linus Mako.D.  On: 06/03/2017 17:45   US Paracentesis  Result Date: 06/05/2017 INDICATION: Ascites EXAM: ULTRASOUND GUIDED THERAPEUTIC PARACENTESIS MEDICATIONS: None. COMPLICATIONS: None immediate. PROCEDURE: Informed written consent was obtained from the patient after a discussion of the risks, benefits and alternatives to treatment. A timeout was performed prior to the initiation of the procedure. Initial ultrasound scanning demonstrates a large amount of ascites within the right lower abdominal quadrant. The right lower abdomen was prepped and draped in the usual sterile fashion. 1% lidocaine was used for local anesthesia. Following this, a 6 Fr Safe-T-Centesis catheter was introduced. An ultrasound image was saved for documentation purposes. The paracentesis was performed. The catheter was removed and a  dressing was applied. The patient tolerated the procedure well without immediate post procedural complication. FINDINGS: A total of approximately 3.3 L of clear yellow fluid was removed. Samples were sent to the laboratory as requested by the clinical team. IMPRESSION: Successful ultrasound-guided paracentesis yielding 3.3 liters of peritoneal fluid. Electronically Signed   By: Inez Catalina M.D.   On: 06/05/2017 11:50   US Paracentesis  Result Date: 05/25/2017 INDICATION: Ascites EXAM: ULTRASOUND GUIDED  PARACENTESIS MEDICATIONS: None. COMPLICATIONS: None immediate. PROCEDURE: Informed written consent was obtained from the patient after a discussion of the risks, benefits and alternatives to treatment. A timeout was performed prior to the initiation of the procedure. Initial ultrasound scanning demonstrates a large amount of ascites within the right lower abdominal quadrant. The right lower abdomen was prepped and draped in the usual sterile fashion. 1% lidocaine with epinephrine was used for local anesthesia. Following this, a 6 Fr Safe-T-Centesis catheter was introduced. An ultrasound image was saved for documentation purposes. The paracentesis was performed. The catheter was removed and a dressing was applied. The patient tolerated the procedure well without immediate post procedural complication. FINDINGS: A total of approximately 9 L of clear yellow fluid was removed. IMPRESSION: Successful ultrasound-guided paracentesis yielding 9 liters of peritoneal fluid. Electronically Signed   By: Marybelle Killings M.D.   On: 05/25/2017 14:09   US Paracentesis  Result Date: 05/08/2017 INDICATION: Abdominal ascites, end-stage renal disease, recurrent distension EXAM: ULTRASOUND GUIDED PARACENTESIS MEDICATIONS: 1% lidocaine local COMPLICATIONS: None immediate. PROCEDURE: An ultrasound guided paracentesis was thoroughly discussed with the patient and questions answered. The benefits, risks, alternatives and complications  were also discussed. The patient understands and wishes to proceed with the procedure. Written consent was obtained. Ultrasound was performed to localize and mark an adequate pocket of fluid in the left lower quadrant of the abdomen. The area was then prepped and draped in the normal sterile fashion. 1% Lidocaine was used for local anesthesia. Under ultrasound guidance a 19 gauge Yueh catheter was introduced. Paracentesis was performed. The catheter was removed and a dressing applied. FINDINGS: A total of approximately 9.6 L of clear peritoneal fluid was removed. A fluid sample was sent for laboratory analysis. IMPRESSION: Successful ultrasound guided paracentesis yielding 9.6 L of ascites. Electronically Signed   By: Jerilynn Mages.  Shick M.D.   On: 05/08/2017 14:17   Dg Chest Port 1 View  Result Date: 06/03/2017 CLINICAL DATA:  Vomiting. EXAM: PORTABLE CHEST 1 VIEW COMPARISON:  May 17, 2017 FINDINGS: Cardiomegaly, stable. The hila and mediastinum are unchanged. No pneumothorax. No pulmonary nodules or masses. No focal infiltrates. IMPRESSION: No active disease. Electronically Signed   By: Dorise Bullion III M.D   On: 06/03/2017 11:40    Assessment/Plan 1.  Complication dialysis device with thrombosis AV access:  Patient's right arm AV access is thrombosed.  The patient will undergo thrombectomy using interventional techniques.  The risks and benefits were described to the patient.  All questions were answered.  The patient agrees to proceed with angiography and intervention. Potassium will be drawn to ensure that it is an appropriate level prior to performing thrombectomy. 2.  End-stage renal disease requiring hemodialysis:  Patient will continue dialysis therapy without further interruption if a successful thrombectomy is not achieved then catheter will be placed. Dialysis has already been arranged since the patient missed their previous session 3.  Hypertension:  Patient will continue medical management;  nephrology is following no changes in oral medications. 4. Diabetes mellitus:  Glucose will be monitored and oral medications been held this morning once the patient has undergone the patient's procedure po intake will be reinitiated and again Accu-Cheks will be used to assess the blood glucose level and treat as needed. The patient will be restarted on the patient's usual hypoglycemic regime 5.  Coronary artery disease:  EKG will be monitored. Nitrates will be used if needed. The patient's oral cardiac medications will be continued.    Hortencia Pilar, MD  06/06/2017 2:58 PM

## 2017-06-06 NOTE — Progress Notes (Signed)
Post HD assessment   06/06/17 1412  Neurological  Level of Consciousness Alert  Orientation Level Oriented X4  Respiratory  Respiratory Pattern Regular;Unlabored  Chest Assessment Chest expansion symmetrical  Bilateral Breath Sounds Diminished  Cardiac  ECG Monitor Yes  Vascular  R Radial Pulse +2  L Radial Pulse +2  Integumentary  Integumentary (WDL) X  Skin Color Appropriate for ethnicity  Musculoskeletal  Musculoskeletal (WDL) X  Generalized Weakness Yes  Assistive Device None  GU Assessment  Genitourinary (WDL) X  Genitourinary Symptoms  (HD)  Psychosocial  Psychosocial (WDL) WDL

## 2017-06-06 NOTE — Progress Notes (Signed)
Pre HD assessment   06/06/17 1006  Neurological  Level of Consciousness Alert  Orientation Level Oriented X4  Respiratory  Respiratory Pattern Regular;Unlabored  Chest Assessment Chest expansion symmetrical  Cardiac  ECG Monitor Yes  Vascular  R Radial Pulse +2  L Radial Pulse +2  Integumentary  Integumentary (WDL) X  Skin Color Appropriate for ethnicity  Musculoskeletal  Musculoskeletal (WDL) X  Generalized Weakness Yes  Assistive Device None  Gastrointestinal  Bowel Sounds Assessment Active  GU Assessment  Genitourinary (WDL) X  Genitourinary Symptoms  (HD)  Psychosocial  Psychosocial (WDL) WDL

## 2017-06-06 NOTE — Progress Notes (Signed)
HD tx end   06/06/17 1400  Vital Signs  Pulse Rate 94  Pulse Rate Source Monitor  Resp 18  BP 120/65  BP Location Left Arm  BP Method Automatic  Patient Position (if appropriate) Lying  Oxygen Therapy  SpO2 100 %  O2 Device Room Air  During Hemodialysis Assessment  Dialysis Fluid Bolus Normal Saline  Bolus Amount (mL) 250 mL  Intra-Hemodialysis Comments Tx completed

## 2017-06-06 NOTE — Anesthesia Postprocedure Evaluation (Signed)
Anesthesia Post Note  Patient: Stanley [name]  Procedure(s) Performed: ENTEROSCOPY (N/A )  Patient location during evaluation: Endoscopy Anesthesia Type: General Level of consciousness: awake and alert Pain management: pain level controlled Vital Signs Assessment: post-procedure vital signs reviewed and stable Respiratory status: spontaneous breathing, nonlabored ventilation and respiratory function stable Cardiovascular status: blood pressure returned to baseline and stable Postop Assessment: no apparent nausea or vomiting Anesthetic complications: no     Last Vitals:  Vitals:   06/06/17 0023 06/06/17 0407  BP: 107/63 110/75  Pulse: 91 96  Resp: 14 18  Temp: 37.1 C 37.3 C  SpO2: 97% 96%    Last Pain:  Vitals:   06/06/17 0847  TempSrc:   PainSc: 7                  Stanley [name]

## 2017-06-06 NOTE — Progress Notes (Signed)
Patient came out of room at [place] and announced he was leaving.  Refused to let RN assess right groin dressing.  Patient again advised regarding risk of extensive bleeding.  Left AMA.

## 2017-06-06 NOTE — Progress Notes (Signed)
Patient returned from [facility] s/p perm cath placement about 17:30.  After eating dinner and receiving pain medication, patient ambulated to nurse's station and announced that he was leaving.  Advised patient that he had femoral temp dialysis cath in; this would need to be removed prior to leaving AMA and further he would need to lay flat for 30 minutes post removal to minimize bleeding risk.  Patient stated, "I have had this before and I'm not scared.  I know how to take care of it".  Patient escorted back to room and catheter removed without complication.  Patient advised to lay flat for thirty minutes however patient states that if his ride comes he will leave no matter how long it has been.  Again strongly advised patient of risk of bleeding to which he replies, "I have had this before and I know how to take care of it".  Will continue to monitor.

## 2017-06-06 NOTE — Progress Notes (Signed)
HD tx start   06/06/17 1013  Vital Signs  Pulse Rate 90  Pulse Rate Source Monitor  Resp (!) 21  BP 102/73  BP Location Left Arm  BP Method Automatic  Patient Position (if appropriate) Lying  Oxygen Therapy  SpO2 98 %  O2 Device Room Air  During Hemodialysis Assessment  Blood Flow Rate (mL/min) 400 mL/min  Arterial Pressure (mmHg) -150 mmHg  Venous Pressure (mmHg) 130 mmHg  Transmembrane Pressure (mmHg) 60 mmHg  Ultrafiltration Rate (mL/min) 850 mL/min  Dialysate Flow Rate (mL/min) 800 ml/min  Conductivity: Machine  14  HD Safety Checks Performed Yes  Dialysis Fluid Bolus Normal Saline  Bolus Amount (mL) 250 mL  Intra-Hemodialysis Comments Tx initiated  Hemodialysis Catheter Right Femoral vein Double-lumen;Temporary  Placement Date: 06/04/17   Person Inserting Catheter: Dr. Lorenso Courier  Orientation: Right  Access Location: Femoral vein  Hemodialysis Catheter Type: Double-lumen;Temporary  Blue Lumen Status Infusing  Red Lumen Status Infusing

## 2017-06-06 NOTE — Progress Notes (Signed)
[facility]  ROUNDING NOTE   Subjective:  Patient seen and evaluated during hemodialysis. Due for a fistulogram later this afternoon. Patient is n.p.o.   Objective:  Vital signs in last 24 hours:  Temp:  [97.8 F (36.6 C)-99.1 F (37.3 C)] 97.8 F (36.6 C) (03/26 1005) Pulse Rate:  [85-96] 87 (03/26 1115) Resp:  [13-22] 16 (03/26 1115) BP: (88-118)/(49-75) 101/60 (03/26 1115) SpO2:  [92 %-100 %] 99 % (03/26 1115) Weight:  [73.5 kg (162 lb 0.6 oz)] 73.5 kg (162 lb 0.6 oz) (03/26 1005)  Weight change:  Filed Weights   06/02/17 1312 06/02/17 1614 06/06/17 1005  Weight: 75.3 kg (166 lb) 70.7 kg (155 lb 14.4 oz) 73.5 kg (162 lb 0.6 oz)    Intake/Output: I/O last 3 completed shifts: In: 84 [P.O.:480; I.V.:503] Out: 200 [Urine:200]   Intake/Output this shift:  Total I/O In: -  Out: 200 [Urine:200]  Physical Exam: General: NAD  Head: Normocephalic, atraumatic. Moist oral mucosal membranes  Eyes: Anicteric  Neck: Supple  Lungs:  Clear to auscultation, normal effort  Heart: Regular rate and rhythm  Abdomen:  Soft, nontender, distended  Extremities: no peripheral edema.  Neurologic: Nonfocal, moving all four extremities  Skin: No lesions  Access: RUE AVF - no bruit or thrill    Basic Metabolic Panel: Recent Labs  Lab 06/02/17 1352 06/03/17 0733 06/04/17 0528 06/05/17 0304  NA 136 141 138 136  K 6.4* 5.2* 5.3* 4.4  CL 100* 103 101 99*  CO2 26 29 25 28   GLUCOSE 88 97 93 98  BUN 55* 34* 49* 34*  CREATININE 7.59* 4.75* 6.46* 4.79*  CALCIUM 8.5* 8.0* 7.5* 7.2*    Liver Function Tests: Recent Labs  Lab 06/02/17 1352 06/05/17 1110  AST 38  --   ALT 37  --   ALKPHOS 172*  --   BILITOT 0.5 0.5  PROT 6.8  --   ALBUMIN 2.7*  --    No results for input(s): LIPASE, AMYLASE in the last 168 hours. No results for input(s): AMMONIA in the last 168 hours.  CBC: Recent Labs  Lab 06/02/17 1352  06/04/17 0528  06/05/17 0304 06/05/17 1110  06/05/17 1928 06/05/17 2332 06/06/17 0534  WBC 7.0  --  13.1*  --  14.2*  --   --   --  11.0*  HGB 6.4*   < > 8.4*   < > 7.1* 7.0* 7.7* 7.7* 7.9*  HCT 21.2*  --  26.7*  --  23.1*  --   --   --  25.4*  MCV 87.9  --  87.8  --  86.4  --   --   --  86.4  PLT 251  --  219  --  224  --   --   --  261   < > = values in this interval not displayed.    Cardiac Enzymes: No results for input(s): CKTOTAL, CKMB, CKMBINDEX, TROPONINI in the last 168 hours.  BNP: Invalid input(s): POCBNP  CBG: Recent Labs  Lab 06/05/17 0546 06/05/17 1143 06/05/17 1718 06/06/17 0002 06/06/17 0552  GLUCAP 88 103* 75 89 82    Microbiology: Results for orders placed or performed during the hospital encounter of 06/02/17  MRSA PCR Screening     Status: None   Collection Time: 06/02/17  4:22 PM  Result Value Ref Range Status   MRSA by PCR NEGATIVE NEGATIVE Final    Comment:        The GeneXpert  MRSA Assay (FDA approved for NASAL specimens only), is one component of a comprehensive MRSA colonization surveillance program. It is not intended to diagnose MRSA infection nor to guide or monitor treatment for MRSA infections. Performed at [facility], [address] [place], [place] [address]   Body fluid culture     Status: None (Preliminary result)   Collection Time: 06/05/17 10:28 AM  Result Value Ref Range Status   Specimen Description   Final    PERITONEAL Performed at [facility], [address] [place], [place] [address]    Special Requests   Final    NONE Performed at [facility], [address] [place], [place] [address]    Gram Stain   Final    WBC PRESENT,BOTH PMN AND MONONUCLEAR NO ORGANISMS SEEN CYTOSPIN SMEAR    Culture   Final    NO GROWTH < 24 HOURS Performed at [facility], [place] [address] [place], [place] [address]    Report Status PENDING  Incomplete    Coagulation Studies: No results for input(s): LABPROT, INR in the last 72  hours.  Urinalysis: No results for input(s): COLORURINE, LABSPEC, PHURINE, GLUCOSEU, HGBUR, BILIRUBINUR, KETONESUR, PROTEINUR, UROBILINOGEN, NITRITE, LEUKOCYTESUR in the last 72 hours.  Invalid input(s): APPERANCEUR    Imaging: US Paracentesis  Result Date: 06/05/2017 INDICATION: Ascites EXAM: ULTRASOUND GUIDED THERAPEUTIC PARACENTESIS MEDICATIONS: None. COMPLICATIONS: None immediate. PROCEDURE: Informed written consent was obtained from the patient after a discussion of the risks, benefits and alternatives to treatment. A timeout was performed prior to the initiation of the procedure. Initial ultrasound scanning demonstrates a large amount of ascites within the right lower abdominal quadrant. The right lower abdomen was prepped and draped in the usual sterile fashion. 1% lidocaine was used for local anesthesia. Following this, a 6 Fr Safe-T-Centesis catheter was introduced. An ultrasound image was saved for documentation purposes. The paracentesis was performed. The catheter was removed and a dressing was applied. The patient tolerated the procedure well without immediate post procedural complication. FINDINGS: A total of approximately 3.3 L of clear yellow fluid was removed. Samples were sent to the laboratory as requested by the clinical team. IMPRESSION: Successful ultrasound-guided paracentesis yielding 3.3 liters of peritoneal fluid. Electronically Signed   By: Inez Catalina M.D.   On: 06/05/2017 11:50     Medications:   . aspirin  81 mg Oral Daily  . atorvastatin  40 mg Oral Daily  . feeding supplement (NEPRO CARB STEADY)  237 mL Oral TID BM  . furosemide  20 mg Intravenous Once  . furosemide  80 mg Oral Daily  . gabapentin  100 mg Oral BID  . insulin aspart  0-9 Units Subcutaneous Q6H  . labetalol  100 mg Oral Daily  . mometasone-formoterol  2 puff Inhalation BID  . multivitamin  1 tablet Oral QHS  . pantoprazole (PROTONIX) IV  40 mg Intravenous Q12H  . sodium chloride flush  3 mL  Intravenous Q12H  . tiotropium  18 mcg Inhalation Daily  . vitamin C  500 mg Oral Daily      Assessment/ Plan:  [name] is a 57 y.o. black male withend stage renal disease on hemodialysis secondary to Alport's syndrome, ascites, hypertension, anemia of chronic kidney disease, coronary artery disease, peripheral vascular disease, hyperlipidemia, gastrointestinal AVMs, pulmonary hypertension,   nooutpatientdialysis unit currently  1.  Acute pulmonary edema 2.  Anemia of chronic kidney disease and GI Bleed 3.  Secondary Hyperparathyroidism  4.  Ascites 5.  End-stage renal disease  Plan: Patient seen and evaluated during hemodialysis today.  Appears to be tolerating well.  We plan to complete dialysis treatment today.  He will be going for fistulogram thereafter.  Hopefully his prior access can be salvaged and a PermCath can be avoided.  Further plan once fistulogram has been performed.   LOS: 4 Stanley [name] 3/26/201911:25 AM

## 2017-06-06 NOTE — Op Note (Signed)
OPERATIVE NOTE   PROCEDURE: 1. Insertion of tunneled dialysis catheter right internal jugular approach with ultrasound and fluoroscopic guidance. 2. Attempted access of thrombosed right brachiocephalic fistula  PRE-OPERATIVE DIAGNOSIS: Complication of dialysis access with thrombosis of right arm fistula; end-stage renal disease requiring hemodialysis  POST-OPERATIVE DIAGNOSIS: Same  SURGEON: Hortencia Pilar.  ANESTHESIA: Conscious sedation was administered under my direct supervision by the interventional radiology RN. IV Versed plus fentanyl were utilized. Continuous ECG, pulse oximetry and blood pressure was monitored throughout the entire procedure. Conscious sedation was for a total of 56 minutes.  ESTIMATED BLOOD LOSS: Minimal cc  CONTRAST USED:  None  FLUOROSCOPY TIME: 0.8 minutes  INDICATIONS:   Stanley Ghosh Pattersonis a 57 y.o. y.o. male who presents with thrombosis of his right arm fistula.  Attempts at accessing the fistula were unsuccessful and therefore he is undergoing placement of a tunneled dialysis catheter so that he can maintain his life sustaining dialysis therapy.  Risks and benefits of been reviewed patient has agreed to proceed  DESCRIPTION: After obtaining full informed written consent, the patient was positioned supine with his right arm extended palm upward.  Right arm was prepped and draped in a sterile fashion.  Ultrasound was placed in a sterile sleeve.  Using a micropuncture needle I was able to access the fistula through the stented segment.  It should be noted that I bent to standard micropuncture needles attempting to get through the stents ultimately I was able to achieve cannulation with the short micropuncture needle used for pedal access.  Wire was advanced under fluoroscopy.  Multiple attempts at passing a micro-sheath were unsuccessful.  Under fluoroscopy there are multiple stents there is severe deformation of the stents and given the inability to even get  a micropuncture sheath positioned I do not believe this fistula is salvageable.  I therefore will move forward with a tunneled catheter.   The right neck and chest wall was prepped and draped in a sterile fashion. Ultrasound was placed in a sterile sleeve. Ultrasound was utilized to identify the right internal jugular vein which is noted to be echolucent and compressible indicating patency. Image is recorded for the permanent record. Under direct ultrasound visualization a micro-needle is inserted into the vein followed by the micro-wire. Micro-sheath was then advanced and a J wire is inserted without difficulty under fluoroscopic guidance. Small counterincision was made at the wire insertion site. Dilators are passed over the wire and the tunneled dialysis catheter is fed into the central venous system without difficulty.  Under fluoroscopy the catheter tip positioned at the atrial caval junction. The catheter is then approximated to the chest wall and an exit site selected. 1% lidocaine is infiltrated in soft tissues at this level small incision is made and the tunneling device is then passed from the exit site to the neck counterincision. Catheter is then connected to the tunneling device and the catheter was pulled subcutaneously. It is then transected and the hub assembly connected without difficulty. Both lumens aspirate and flush easily. After verification of smooth contour with proper tip position under fluoroscopy the catheter is packed with 5000 units of heparin per lumen.  Catheter secured to the skin of the right chest wall with 0 silk. A sterile dressing is applied with a Biopatch.  COMPLICATIONS: None  CONDITION: Good  Hortencia Pilar [place] [facility]. Office:  [phone]   06/06/2017,4:52 PM

## 2017-06-06 NOTE — H&P (Signed)
Chief Complaint: Thrombosed arteriovenous fistula  Referring Physician(s): Jamesetta So A  Supervising Physician: [name]  Patient Status: [place] - In-pt  History of Present Illness: Stanley [name] is a 57 y.o. male withlong-standing end stage renal disease on hemodialysis secondary to Alport's syndrome.  Other medical issues include ascites, hypertension, anemia of chronic kidney disease, coronary artery disease, peripheral vascular disease, hyperlipidemia, gastrointestinal AVMs, pulmonary hypertension.  He was admitted on 06/02/2017 with hematemesis,generalized weakness, and dizziness.    He also reported melena several days ago.    His hemoglobin was 8.2 one week ago was down to 6.4 .    He was admitted for management.  He was also found to have a thrombosed right brachiocephalic fistula, likely due to anemia and hypotension (83/65 documented)/  We are asked to evaluate him for thrombectomy.  He tells me he has had this fistula for 18 years. He had attempt at fistula on the left but it is small and nonfunctional.  He currently has a femoral temp cath in place.  He has had a tunneled cath in the right chest previously.  Past Medical History:  Diagnosis Date  . Alcohol abuse   . CHF (congestive heart failure) ([place])   . Cirrhosis ([place])   . Coronary artery disease 2009  . Diabetic peripheral neuropathy ([place])   . Drug abuse ([place])   . End stage renal disease on dialysis [facility]) NEPHROLOGIST-   DR [facility]  IN [place]   HEMODIALYSIS --   TUES/  THURS/  SAT  . GERD (gastroesophageal reflux disease)   . Hyperlipidemia   . Hypertension   . PAD (peripheral artery disease) ([place])   . Renal insufficiency    Per pt, 32 oz fluid restriction per day  . S/P triple vessel bypass 06/09/2016   2009ish  . Suicidal ideation    & HOMICIDAL IDEATION --  06-16-2013   ADMITTED TO BEHAVIOR HEALTH    Past Surgical History:  Procedure Laterality Date  . AGILE CAPSULE N/A  06/19/2016   Procedure: AGILE CAPSULE;  Surgeon: [name], MD;  Location: [facility] ENDOSCOPY;  Service: Endoscopy;  Laterality: N/A;  . COLONOSCOPY WITH PROPOFOL N/A 06/18/2016   Procedure: COLONOSCOPY WITH PROPOFOL;  Surgeon: [name], MD;  Location: [facility] ENDOSCOPY;  Service: Endoscopy;  Laterality: N/A;  . COLONOSCOPY WITH PROPOFOL N/A 08/12/2016   Procedure: COLONOSCOPY WITH PROPOFOL;  Surgeon: [name], MD;  Location: [facility] ENDOSCOPY;  Service: Endoscopy;  Laterality: N/A;  . COLONOSCOPY WITH PROPOFOL N/A 05/05/2017   Procedure: COLONOSCOPY WITH PROPOFOL;  Surgeon: Manya Silvas, MD;  Location: [facility] ENDOSCOPY;  Service: Endoscopy;  Laterality: N/A;  . CORONARY ANGIOPLASTY  ?   PT UNABLE TO TELL IF  BEFORE OR AFTER  CABG  . CORONARY ARTERY BYPASS GRAFT  2008  ([place] , [place])   3 VESSEL  . DIALYSIS FISTULA CREATION  LAST SURGERY  APPOX  2008  . ENTEROSCOPY N/A 05/10/2016   Procedure: ENTEROSCOPY;  Surgeon: Jerene Bears, MD;  Location: [place];  Service: Gastroenterology;  Laterality: N/A;  . ENTEROSCOPY N/A 08/12/2016   Procedure: ENTEROSCOPY;  Surgeon: [name], MD;  Location: [facility] ENDOSCOPY;  Service: Endoscopy;  Laterality: N/A;  . ENTEROSCOPY Left 06/03/2017   Procedure: ENTEROSCOPY;  Surgeon: Virgel Manifold, MD;  Location: [facility] ENDOSCOPY;  Service: Endoscopy;  Laterality: Left;  Procedure date will ultimately depend on when patient is medically optimized before the procedure, pending hemodialysis and blood transfusions etc. Will place on schedule and change  depending on clinical status.   . ESOPHAGOGASTRODUODENOSCOPY N/A 05/07/2015   Procedure: ESOPHAGOGASTRODUODENOSCOPY (EGD);  Surgeon: Hulen Luster, MD;  Location: [facility] ENDOSCOPY;  Service: Endoscopy;  Laterality: N/A;  . ESOPHAGOGASTRODUODENOSCOPY (EGD) WITH PROPOFOL N/A 05/17/2015   Procedure: ESOPHAGOGASTRODUODENOSCOPY (EGD) WITH PROPOFOL;  Surgeon: [name], MD;  Location: [facility] ENDOSCOPY;  Service: Endoscopy;  Laterality:  N/A;  . ESOPHAGOGASTRODUODENOSCOPY (EGD) WITH PROPOFOL N/A 01/20/2016   Procedure: ESOPHAGOGASTRODUODENOSCOPY (EGD) WITH PROPOFOL;  Surgeon: [name], MD;  Location: [facility] ENDOSCOPY;  Service: Endoscopy;  Laterality: N/A;  . ESOPHAGOGASTRODUODENOSCOPY (EGD) WITH PROPOFOL N/A 04/17/2016   Procedure: ESOPHAGOGASTRODUODENOSCOPY (EGD) WITH PROPOFOL;  Surgeon: Lin Landsman, MD;  Location: [facility] ENDOSCOPY;  Service: Gastroenterology;  Laterality: N/A;  . ESOPHAGOGASTRODUODENOSCOPY (EGD) WITH PROPOFOL  05/09/2016   Procedure: ESOPHAGOGASTRODUODENOSCOPY (EGD) WITH PROPOFOL;  Surgeon: Jerene Bears, MD;  Location: [place];  Service: Endoscopy;;  . ESOPHAGOGASTRODUODENOSCOPY (EGD) WITH PROPOFOL N/A 06/16/2016   Procedure: ESOPHAGOGASTRODUODENOSCOPY (EGD) WITH PROPOFOL;  Surgeon: [name], MD;  Location: [facility] ENDOSCOPY;  Service: Endoscopy;  Laterality: N/A;  . ESOPHAGOGASTRODUODENOSCOPY (EGD) WITH PROPOFOL N/A 05/05/2017   Procedure: ESOPHAGOGASTRODUODENOSCOPY (EGD) WITH PROPOFOL;  Surgeon: Manya Silvas, MD;  Location: [facility] ENDOSCOPY;  Service: Endoscopy;  Laterality: N/A;  . GIVENS CAPSULE STUDY N/A 05/07/2016   Procedure: GIVENS CAPSULE STUDY;  Surgeon: Doran Stabler, MD;  Location: [place];  Service: Endoscopy;  Laterality: N/A;  . MANDIBULAR HARDWARE REMOVAL N/A 07/29/2013   Procedure: REMOVAL OF ARCH BARS;  Surgeon: Theodoro Kos, DO;  Location: [place];  Service: Plastics;  Laterality: N/A;  . ORIF MANDIBULAR FRACTURE N/A 06/05/2013   Procedure: REPAIR OF MANDIBULAR FRACTURE x 2 with maxillo-mandibular fixation ;  Surgeon: Theodoro Kos, DO;  Location: [place];  Service: Plastics;  Laterality: N/A;  . PERIPHERAL ARTERIAL STENT GRAFT Left     Allergies: Patient has no known allergies.  Medications: Prior to Admission medications   Medication Sig Start Date End Date Taking? Authorizing Provider  albuterol (PROVENTIL HFA;VENTOLIN HFA) 108 (90 Base) MCG/ACT inhaler  Inhale 2 puffs into the lungs every 6 (six) hours as needed for wheezing or shortness of breath. 04/21/17   Lisa Roca, MD  alum & mag hydroxide-simeth (MAALOX/MYLANTA) 200-200-20 MG/5ML suspension Take 15 mLs every 4 (four) hours as needed by mouth for indigestion or heartburn. Patient not taking: Reported on 04/19/2017 01/25/17   Demetrios Loll, MD  atorvastatin (LIPITOR) 40 MG tablet Take 1 tablet (40 mg total) by mouth daily. 11/16/16   Vaughan Basta, MD  budesonide-formoterol (SYMBICORT) 160-4.5 MCG/ACT inhaler Inhale 2 puffs into the lungs daily. 11/16/16   Vaughan Basta, MD  calcium acetate (PHOSLO) 667 MG capsule Take 2,001 mg by mouth 3 (three) times daily.    [provider]  ferrous sulfate 325 (65 FE) MG tablet Take 1 tablet by mouth 3 (three) times daily. 01/21/17   [provider]  furosemide (LASIX) 80 MG tablet Take 1 tablet (80 mg total) daily by mouth. 01/25/17   Demetrios Loll, MD  gabapentin (NEURONTIN) 300 MG capsule Take 1 capsule by mouth daily as directed 08/16/16   [provider]  ipratropium-albuterol (DUONEB) 0.5-2.5 (3) MG/3ML SOLN Take 3 mLs by nebulization every 6 (six) hours as needed. 11/28/16   Loletha Grayer, MD  labetalol (NORMODYNE) 100 MG tablet Take 100 mg by mouth daily. 03/24/17   [provider]  Multiple Vitamins-Minerals-FA (DIALYVITE SUPREME D) 3 MG TABS Take 1 tablet (3 mg total) by mouth daily. 11/28/16  Loletha Grayer, MD  nitroGLYCERIN (NITROSTAT) 0.4 MG SL tablet Place 1 tablet (0.4 mg total) under the tongue every 5 (five) minutes as needed. 04/12/16   Wende Bushy, MD  pantoprazole (PROTONIX) 40 MG tablet Take 1 tablet (40 mg total) by mouth daily. 11/28/16   Loletha Grayer, MD  Spacer/Aero Chamber Mouthpiece MISC 1 Units by Does not apply route every 4 (four) hours as needed (wheezing). 02/19/17   Darel Hong, MD  tiotropium (SPIRIVA HANDIHALER) 18 MCG inhalation capsule Place 1 capsule (18 mcg total)  into inhaler and inhale daily. 11/16/16 11/16/17  Vaughan Basta, MD     Family History  Problem Relation Age of Onset  . Colon cancer Mother   . Cancer Father   . Cancer Sister   . Kidney disease Brother     Social History   Socioeconomic History  . Marital status: Single    Spouse name: Not on file  . Number of children: Not on file  . Years of education: Not on file  . Highest education level: Not on file  Occupational History  . Occupation: disabled  Social Needs  . Financial resource strain: Not on file  . Food insecurity:    Worry: Not on file    Inability: Not on file  . Transportation needs:    Medical: Not on file    Non-medical: Not on file  Tobacco Use  . Smoking status: Current Every Day Smoker    Packs/day: 0.15    Years: 40.00    Pack years: 6.00    Types: Cigarettes  . Smokeless tobacco: Never Used  Substance and Sexual Activity  . Alcohol use: No    Comment: pt reports quitting after learning about cirrhosis  . Drug use: No    Frequency: 7.0 times per week    Types: Marijuana, Cocaine  . Sexual activity: Never  Lifestyle  . Physical activity:    Days per week: Not on file    Minutes per session: Not on file  . Stress: Not on file  Relationships  . Social connections:    Talks on phone: Not on file    Gets together: Not on file    Attends religious service: Not on file    Active member of club or organization: Not on file    Attends meetings of clubs or organizations: Not on file    Relationship status: Not on file  Other Topics Concern  . Not on file  Social History Narrative  . Not on file   Review of Systems: A 12 point ROS discussed and pertinent positives are indicated in the HPI above.  All other systems are negative. Review of Systems  Vital Signs: BP 110/75 (BP Location: Left Arm)   Pulse 96   Temp 99.1 F (37.3 C) (Oral)   Resp 18   Ht 6\' 3"  (1.905 m)   Wt 155 lb 14.4 oz (70.7 kg)   SpO2 96%   BMI 19.49 kg/m    Physical Exam  Constitutional: He is oriented to person, place, and time. He appears well-developed.  HENT:  Head: Normocephalic and atraumatic.  Eyes: EOM are normal.  Neck: Normal range of motion.  Cardiovascular: Normal rate and regular rhythm.  Pulmonary/Chest: Effort normal and breath sounds normal.  Musculoskeletal:       Arms: Red =right brachiocephalic fistula with no thrill nor bruit c/w thrombosis.  Pink = previous left brachiocephalic fistula, small, non-functional  Neurological: He is alert and oriented to person, place,  and time.  Skin: Skin is warm and dry.  Psychiatric: He has a normal mood and affect. His behavior is normal. Judgment and thought content normal.  Vitals reviewed.   Imaging: Dg Chest 2 View  Result Date: 05/17/2017 CLINICAL DATA:  Mid chest pain and shortness of breath for the past 6 days. The patient has missed several dialysis episodes. History of CHF, coronary artery disease, and peripheral vascular disease. Previous CABG. EXAM: CHEST - 2 VIEW COMPARISON:  Chest x-ray of May 03, 2017 FINDINGS: The lungs are well-expanded. The interstitial markings are increased. The pulmonary vascularity is engorged. The cardiac silhouette is mildly enlarged. There are post CABG changes. There is calcification in the wall of the thoracic aorta. A trace of pleural fluid blunts the posterior costophrenic angles. IMPRESSION: Findings compatible with low-grade CHF.  No alveolar pneumonia. Thoracic aortic atherosclerosis. Electronically Signed   By: David  Martinique M.D.   On: 05/17/2017 12:33   Ct Angio Chest Pe W Or Wo Contrast  Result Date: 06/03/2017 CLINICAL DATA:  Hypoxia EXAM: CT ANGIOGRAPHY CHEST WITH CONTRAST TECHNIQUE: Multidetector CT imaging of the chest was performed using the standard protocol during bolus administration of intravenous contrast. Multiplanar CT image reconstructions and MIPs were obtained to evaluate the vascular anatomy. CONTRAST:  35mL  ISOVUE-370 IOPAMIDOL (ISOVUE-370) INJECTION 76% COMPARISON:  Plain film from 06/03/2017, CT from 03/20/16 FINDINGS: Cardiovascular: Thoracic aorta and its branches demonstrate atherosclerotic calcifications without aneurysmal dilatation or dissection. No significant cardiac enlargement is noted. Heavy coronary calcifications are seen. The pulmonary artery is well visualized with a normal branching pattern. No filling defects to suggest pulmonary emboli are identified. Mediastinum/Nodes: Thoracic inlet is within normal limits. No significant hilar or mediastinal adenopathy is identified. The esophagus is within normal limits. Lungs/Pleura: Emphysematous changes are noted in the lungs bilaterally. The right lung is clear with the exception of some alveolar infiltrate in the lower lobe. More marked alveolar infiltrate is noted in the left lower lobe with areas of consolidation. These changes are also noted but to a slightly lesser degree in the left upper lobe. These have increased in the interval from the prior plain film examination today. Upper Abdomen: Visualized upper abdomen demonstrates cirrhotic change of the liver with moderate ascites. No other focal abnormality in the upper abdomen is seen. No evidence of varices are noted. Musculoskeletal: Degenerative changes of the thoracic spine are noted. Old right rib fractures are noted. Review of the MIP images confirms the above findings. IMPRESSION: Diffuse left-sided infiltrate slightly greater in the lower lobe than the upper lobe with minimal infiltrative changes in the right lower lobe. Given the abrupt change from the recent plain film this likely represents some asymmetric pulmonary edema. Clinical correlation is recommended. Cirrhotic change with evidence of moderate ascites. No evidence of pulmonary embolism. Aortic Atherosclerosis (ICD10-I70.0) and Emphysema (ICD10-J43.9). Electronically Signed   By: Inez Catalina M.D.   On: 06/03/2017 17:45   US  Paracentesis  Result Date: 06/05/2017 INDICATION: Ascites EXAM: ULTRASOUND GUIDED THERAPEUTIC PARACENTESIS MEDICATIONS: None. COMPLICATIONS: None immediate. PROCEDURE: Informed written consent was obtained from the patient after a discussion of the risks, benefits and alternatives to treatment. A timeout was performed prior to the initiation of the procedure. Initial ultrasound scanning demonstrates a large amount of ascites within the right lower abdominal quadrant. The right lower abdomen was prepped and draped in the usual sterile fashion. 1% lidocaine was used for local anesthesia. Following this, a 6 Fr Safe-T-Centesis catheter was introduced. An ultrasound image was  saved for documentation purposes. The paracentesis was performed. The catheter was removed and a dressing was applied. The patient tolerated the procedure well without immediate post procedural complication. FINDINGS: A total of approximately 3.3 L of clear yellow fluid was removed. Samples were sent to the laboratory as requested by the clinical team. IMPRESSION: Successful ultrasound-guided paracentesis yielding 3.3 liters of peritoneal fluid. Electronically Signed   By: Inez Catalina M.D.   On: 06/05/2017 11:50   US Paracentesis  Result Date: 05/25/2017 INDICATION: Ascites EXAM: ULTRASOUND GUIDED  PARACENTESIS MEDICATIONS: None. COMPLICATIONS: None immediate. PROCEDURE: Informed written consent was obtained from the patient after a discussion of the risks, benefits and alternatives to treatment. A timeout was performed prior to the initiation of the procedure. Initial ultrasound scanning demonstrates a large amount of ascites within the right lower abdominal quadrant. The right lower abdomen was prepped and draped in the usual sterile fashion. 1% lidocaine with epinephrine was used for local anesthesia. Following this, a 6 Fr Safe-T-Centesis catheter was introduced. An ultrasound image was saved for documentation purposes. The paracentesis  was performed. The catheter was removed and a dressing was applied. The patient tolerated the procedure well without immediate post procedural complication. FINDINGS: A total of approximately 9 L of clear yellow fluid was removed. IMPRESSION: Successful ultrasound-guided paracentesis yielding 9 liters of peritoneal fluid. Electronically Signed   By: Marybelle Killings M.D.   On: 05/25/2017 14:09   US Paracentesis  Result Date: 05/08/2017 INDICATION: Abdominal ascites, end-stage renal disease, recurrent distension EXAM: ULTRASOUND GUIDED PARACENTESIS MEDICATIONS: 1% lidocaine local COMPLICATIONS: None immediate. PROCEDURE: An ultrasound guided paracentesis was thoroughly discussed with the patient and questions answered. The benefits, risks, alternatives and complications were also discussed. The patient understands and wishes to proceed with the procedure. Written consent was obtained. Ultrasound was performed to localize and mark an adequate pocket of fluid in the left lower quadrant of the abdomen. The area was then prepped and draped in the normal sterile fashion. 1% Lidocaine was used for local anesthesia. Under ultrasound guidance a 19 gauge Yueh catheter was introduced. Paracentesis was performed. The catheter was removed and a dressing applied. FINDINGS: A total of approximately 9.6 L of clear peritoneal fluid was removed. A fluid sample was sent for laboratory analysis. IMPRESSION: Successful ultrasound guided paracentesis yielding 9.6 L of ascites. Electronically Signed   By: Jerilynn Mages.  Shick M.D.   On: 05/08/2017 14:17   Dg Chest Port 1 View  Result Date: 06/03/2017 CLINICAL DATA:  Vomiting. EXAM: PORTABLE CHEST 1 VIEW COMPARISON:  May 17, 2017 FINDINGS: Cardiomegaly, stable. The hila and mediastinum are unchanged. No pneumothorax. No pulmonary nodules or masses. No focal infiltrates. IMPRESSION: No active disease. Electronically Signed   By: Dorise Bullion III M.D   On: 06/03/2017 11:40     Labs:  CBC: Recent Labs    06/02/17 1352  06/04/17 0528  06/05/17 0304 06/05/17 1110 06/05/17 1928 06/05/17 2332 06/06/17 0534  WBC 7.0  --  13.1*  --  14.2*  --   --   --  11.0*  HGB 6.4*   < > 8.4*   < > 7.1* 7.0* 7.7* 7.7* 7.9*  HCT 21.2*  --  26.7*  --  23.1*  --   --   --  25.4*  PLT 251  --  219  --  224  --   --   --  261   < > = values in this interval not displayed.    COAGS: Recent  Labs    08/16/16 0748  01/23/17 0105 03/24/17 0943 05/03/17 0803 05/25/17 0501  INR 1.17   < > 1.06 1.03 1.14 1.08  APTT 29  --  32 32  --   --    < > = values in this interval not displayed.    BMP: Recent Labs    06/02/17 1352 06/03/17 0733 06/04/17 0528 06/05/17 0304  NA 136 141 138 136  K 6.4* 5.2* 5.3* 4.4  CL 100* 103 101 99*  CO2 26 29 25 28   GLUCOSE 88 97 93 98  BUN 55* 34* 49* 34*  CALCIUM 8.5* 8.0* 7.5* 7.2*  CREATININE 7.59* 4.75* 6.46* 4.79*  GFRNONAA 7* 12* 8* 12*  GFRAA 8* 14* 10* 14*    LIVER FUNCTION TESTS: Recent Labs    04/12/17 1050  05/03/17 0803 05/17/17 1213 05/29/17 1616 06/02/17 1352 06/05/17 1110  BILITOT 0.5  --  0.6 1.6*  --  0.5 0.5  AST 60*  --  23 20  --  38  --   ALT 65*  --  18 14*  --  37  --   ALKPHOS 302*  --  129* 122  --  172*  --   PROT 6.8  --  6.2* 6.8  --  6.8  --   ALBUMIN 3.1*   < > 2.5* 2.8* 2.4* 2.7*  --    < > = values in this interval not displayed.    TUMOR MARKERS: No results for input(s): AFPTM, CEA, CA199, CHROMGRNA in the last 8760 hours.  Assessment and Plan:  End stage renal failure on hemodialysis.  Thrombosed right brachiocephalic fistula.  Tomorrow, will image fistula in IR with Sonosite (ultrasound) and consider attempt at thrombectomy. Depending on findings, may attempt thrombectomy versus placement of tunneled hemodialysis catheter.  The patient understands he will likely need placement of a tunneled catheter.  Risks and benefits discussed with the patient including, but not limited  to bleeding, infection, vascular injury, pulmonary embolism, need for tunneled HD catheter placement or even death.  Risks and benefits discussed with the patient including, but not limited to bleeding, infection, vascular injury, pneumothorax which may require chest tube placement, air embolism or even death  All of the patient's questions were answered, patient is agreeable to proceed. Consent signed and in chart.  Thank you for this interesting consult.  I greatly enjoyed meeting Previn Jian and look forward to participating in their care.  A copy of this report was sent to the requesting provider on this date.  Electronically Signed: Murrell Redden, PA-C 06/06/2017, 9:49 AM   I spent a total of 40 Minutes in face to face in clinical consultation, greater than 50% of which was counseling/coordinating care for declot with possible tunneled HD cath placement.

## 2017-06-06 NOTE — Progress Notes (Signed)
Post HD assessment. Pt tolerated tx well without c/o or complications. Net UF 2584, goal met.    06/06/17 1413  Vital Signs  Temp 98.3 F (36.8 C)  Temp Source Oral  Pulse Rate 93  Pulse Rate Source Monitor  Resp 20  BP 120/61  BP Location Left Arm  BP Method Automatic  Patient Position (if appropriate) Lying  Oxygen Therapy  SpO2 100 %  O2 Device Room Air  Dialysis Weight  Weight 71.5 kg (157 lb 10.1 oz)  Type of Weight Post-Dialysis  Post-Hemodialysis Assessment  Rinseback Volume (mL) 250 mL  KECN 85.3 V  Dialyzer Clearance Lightly streaked  Duration of HD Treatment -hour(s) 3.5 hour(s)  Hemodialysis Intake (mL) 500 mL  UF Total -Machine (mL) 3084 mL  Net UF (mL) 2584 mL  Tolerated HD Treatment Yes  Education / Care Plan  Dialysis Education Provided Yes  Documented Education in Care Plan Yes  Hemodialysis Catheter Right Femoral vein Double-lumen;Temporary  Placement Date: 06/04/17   Person Inserting Catheter: Dr. Lorenso Courier  Orientation: Right  Access Location: Femoral vein  Hemodialysis Catheter Type: Double-lumen;Temporary  Site Condition No complications  Blue Lumen Status Heparin locked  Red Lumen Status Heparin locked  Catheter fill solution Heparin 1000 units/ml  Catheter fill volume (Arterial) 1.4 cc  Catheter fill volume (Venous) 1.4  Dressing Status Clean;Dry;Intact  Drainage Description None  Post treatment catheter status Capped and Clamped

## 2017-06-06 NOTE — Progress Notes (Signed)
Paged and notified Dr. Vianne Bulls of patient's intent to leave AMA; temp dialysis catheter in right femoral.

## 2017-06-06 NOTE — Progress Notes (Signed)
[name], MD [address] [place], [place], [place], [address] [address], [place], [place], [place], [address] Phone: [phone]  Fax: [phone]   Subjective: Patient in dialysis today.  Denies any abdominal pain, no nausea vomiting.  No hematemesis, no blood in stool.   Objective: Vital signs in last 24 hours: Vitals:   06/06/17 1330 06/06/17 1345 06/06/17 1400 06/06/17 1413  BP: 120/76 123/78 120/65 120/61  Pulse: 91 94 94 93  Resp: (!) 21 19 18 20   Temp:    98.3 F (36.8 C)  TempSrc:    Oral  SpO2: 100% 100% 100% 100%  Weight:    157 lb 10.1 oz (71.5 kg)  Height:       Weight change:   Intake/Output Summary (Last 24 hours) at 06/06/2017 1427 Last data filed at 06/06/2017 1413 Gross per 24 hour  Intake 243 ml  Output 2884 ml  Net -2641 ml     Exam:  Pulm: CTA b/l, Normal Resp Effort Abd: Soft, NT/ND, No HSM Skin: Warm, no rashes Neck: Supple, Trachea midline   Lab Results: Lab Results  Component Value Date   WBC 11.0 (H) 06/06/2017   HGB 7.6 (L) 06/06/2017   HCT 25.4 (L) 06/06/2017   MCV 86.4 06/06/2017   PLT 261 06/06/2017   Micro Results: Recent Results (from the past 240 hour(s))  MRSA PCR Screening     Status: None   Collection Time: 06/02/17  4:22 PM  Result Value Ref Range Status   MRSA by PCR NEGATIVE NEGATIVE Final    Comment:        The GeneXpert MRSA Assay (FDA approved for NASAL specimens only), is one component of a comprehensive MRSA colonization surveillance program. It is not intended to diagnose MRSA infection nor to guide or monitor treatment for MRSA infections. Performed at [facility], [address] [place], [place] [address]   Body fluid culture     Status: None (Preliminary result)   Collection Time: 06/05/17 10:28 AM  Result Value Ref Range Status   Specimen Description   Final    PERITONEAL Performed at [facility], [address], [place], [place] [address]    Special Requests   Final    NONE Performed at [facility], [address] [place], [place] [address]    Gram Stain   Final    WBC PRESENT,BOTH PMN AND MONONUCLEAR NO ORGANISMS SEEN CYTOSPIN SMEAR    Culture   Final    NO GROWTH < 24 HOURS Performed at [facility], [place] [address], [place], [place] [address]    Report Status PENDING  Incomplete   Studies/Results: US Paracentesis  Result Date: 06/05/2017 INDICATION: Ascites EXAM: ULTRASOUND GUIDED THERAPEUTIC PARACENTESIS MEDICATIONS: None. COMPLICATIONS: None immediate. PROCEDURE: Informed written consent was obtained from the patient after a discussion of the risks, benefits and alternatives to treatment. A timeout was performed prior to the initiation of the procedure. Initial ultrasound scanning demonstrates a large amount of ascites within the right lower abdominal quadrant. The right lower abdomen was prepped and draped in the usual sterile fashion. 1% lidocaine was used for local anesthesia. Following this, a 6 Fr Safe-T-Centesis catheter was introduced. An ultrasound image was saved for documentation purposes. The paracentesis was performed. The catheter was removed and a dressing was applied. The patient tolerated the procedure well without immediate post procedural complication. FINDINGS: A total of approximately 3.3 L of clear yellow fluid was removed. Samples were  sent to the laboratory as requested by the clinical team. IMPRESSION: Successful ultrasound-guided paracentesis yielding 3.3 liters of peritoneal fluid. Electronically Signed   By: Inez Catalina M.D.   On: 06/05/2017 11:50   Medications:  Scheduled Meds: . aspirin  81 mg Oral Daily  . atorvastatin  40 mg Oral Daily  . feeding supplement (NEPRO CARB STEADY)  237 mL Oral TID BM  . furosemide  20 mg Intravenous Once  . furosemide  80 mg Oral Daily  . gabapentin  100 mg Oral BID  . insulin aspart  0-9 Units Subcutaneous Q6H  . labetalol  100 mg Oral Daily   . mometasone-formoterol  2 puff Inhalation BID  . multivitamin  1 tablet Oral QHS  . pantoprazole (PROTONIX) IV  40 mg Intravenous Q12H  . sodium chloride flush  3 mL Intravenous Q12H  . tiotropium  18 mcg Inhalation Daily  . vitamin C  500 mg Oral Daily   Continuous Infusions: . sodium chloride    . sodium chloride    . cefTRIAXone (ROCEPHIN)  IV Stopped (06/05/17 1148)   PRN Meds:.sodium chloride, acetaminophen **OR** acetaminophen, albuterol, diphenhydrAMINE, morphine injection, nitroGLYCERIN, ondansetron **OR** ondansetron (ZOFRAN) IV, phenol, sodium chloride flush, traMADol   Assessment: Active Problems:   GIB (gastrointestinal bleeding)   Reflux esophagitis   Idiopathic esophageal varices without bleeding (HCC)   Secondary esophageal varices without bleeding (HCC)   Stomach irritation   Malnutrition of moderate degree    Plan: No further signs of active GI bleeding See enteroscopy notes that did not reveal any  bleeding lesions Grade B esophagitis, and small grade 1 esophageal varices were noted (with no stigmata of recent bleeding) Would continue Prilosec 20 mg once daily for 4 weeks on discharge.    And then he can follow-up with [facility] clinic GI in regard to any need for further treatments Patient should follow-up with [facility] clinic GI discharge within 1-2 weeks.  This was discussed with him extensively, and he states he will call them to make that follow-up appointment.  Anemia is likely multifactorial from his ESRD.  Continue Epogen with dialysis. Ferritin level is 53, and iron level was 55 on this admission.  Small bowel enteroscopy with [facility] was recommended in him in the past.  He should follow-up with them in this regard.  [facility] clinic GI follow-up in 1-2 weeks, and they can set this up.  This was discussed with the patient as well, and he verbalized understanding.  Paracentesis yesterday shows SBP, with WBC count of 785, neutrophils 72%  Patient should  be continued on   Antibiotics as an outpatient as well for SBP.  SBP prophylaxis is indicated in this case as patients who have had 1 or more episodes of SBP, or eligible for prophylactic treatment to prevent recurrence. he should follow-up with [facility] clinic GI within 1-2 weeks in this regard. After SBP treatment dosing is complete, example of prophylaxis dosing from up-to-date includes "Prolonged outpatient  trimethoprim-sulfamethoxazole therapy (one double-strength tablet once daily) or fluoroquinolone therapy (eg, ciprofloxacin 500 mg/day or norfloxacin 400 mg/day"  Primary team to please make the appointment with [facility] clinic GI before patient gets discharged to improve compliance.   LOS: 4 days   [name], MD 06/06/2017, 2:27 PM

## 2017-06-06 NOTE — Progress Notes (Signed)
Pharmacy Antibiotic Note  Stanley [name] is a 57 y.o. male admitted on 06/02/2017 with GIB.  Pharmacy has been consulted for ceftriaxone dosing.  Plan: Ceftriaxone 1 g IV q24h  Height: 6\' 3"  (190.5 cm) Weight: 155 lb 14.4 oz (70.7 kg) IBW/kg (Calculated) : 84.5  Temp (24hrs), Avg:98.5 F (36.9 C), Min:98.1 F (36.7 C), Max:99.1 F (37.3 C)  Recent Labs  Lab 06/02/17 1352 06/03/17 0733 06/04/17 0528 06/05/17 0304 06/06/17 0534  WBC 7.0  --  13.1* 14.2* 11.0*  CREATININE 7.59* 4.75* 6.46* 4.79*  --     Estimated Creatinine Clearance: 16.8 mL/min (A) (by C-G formula based on SCr of 4.79 mg/dL (H)).    No Known Allergies  Antimicrobials this admission: Ceftriaxone 3/23 >>  Dose adjustments this admission:   Microbiology results: 3/22 MRSA PCR: neg  Thank you for allowing pharmacy to be a part of this patient's care.  Pernell Dupre, PharmD, BCPS Clinical Pharmacist 06/06/2017 8:37 AM

## 2017-06-06 NOTE — Progress Notes (Signed)
Pre HD assessment    06/06/17 1005  Vital Signs  Temp 97.8 F (36.6 C)  Temp Source Oral  Pulse Rate 87  Pulse Rate Source Monitor  Resp 13  BP 111/70  BP Location Left Arm  BP Method Automatic  Patient Position (if appropriate) Lying  Oxygen Therapy  SpO2 98 %  O2 Device Room Air  Pain Assessment  Pain Scale 0-10  Pain Score 0  Dialysis Weight  Weight 73.5 kg (162 lb 0.6 oz)  Type of Weight Pre-Dialysis  Time-Out for Hemodialysis  What Procedure? HD  Pt Identifiers(min of two) First/Last Name;MRN/Account#  Correct Site? Yes  Correct Side? Yes  Correct Procedure? Yes  Consents Verified? Yes  Rad Studies Available? N/A  Safety Precautions Reviewed? Yes  Engineer, civil (consulting) Number  (3A)  Station Number 4  UF/Alarm Test Passed  Conductivity: Meter 13.6  Conductivity: Machine  14  pH 7.6  Reverse Osmosis main  Normal Saline Lot Number [id number]  Dialyzer Lot Number [id number]  Disposable Set Lot Number [id number]  Machine Temperature 98.6 F (37 C)  Musician and Audible Yes  Blood Lines Intact and Secured Yes  Pre Treatment Patient Checks  Vascular access used during treatment Catheter  Hepatitis B Surface Antigen Results Negative  Date Hepatitis B Surface Antigen Drawn 03/01/17  Hepatitis B Surface Antibody  (>10)  Date Hepatitis B Surface Antibody Drawn 03/01/17  Hemodialysis Consent Verified Yes  Hemodialysis Standing Orders Initiated Yes  ECG (Telemetry) Monitor On Yes  Prime Ordered Normal Saline  Length of  DialysisTreatment -hour(s) 3.5 Hour(s)  Dialyzer Elisio 17H NR  Dialysate 2K, 2.5 Ca  Dialysis Anticoagulant None  Dialysate Flow Ordered 800  Blood Flow Rate Ordered 400 mL/min  Ultrafiltration Goal 2.5 Liters  Pre Treatment Labs Phosphorus;Other (Comment) (Hemoglobin )  Dialysis Blood Pressure Support Ordered Normal Saline  Education / Care Plan  Dialysis Education Provided Yes  Documented Education in Care Plan Yes  Hemodialysis  Catheter Right Femoral vein Double-lumen;Temporary  Placement Date: 06/04/17   Person Inserting Catheter: Dr. Lorenso Courier  Orientation: Right  Access Location: Femoral vein  Hemodialysis Catheter Type: Double-lumen;Temporary  Site Condition No complications  Blue Lumen Status Heparin locked  Red Lumen Status Heparin locked  Dressing Status Clean;Dry;Intact  Drainage Description None

## 2017-06-07 ENCOUNTER — Encounter: Payer: Self-pay | Admitting: Vascular Surgery

## 2017-06-07 NOTE — Discharge Summary (Signed)
[place] at [place], 57 y.o., DOB [birth year], MRN [id number]. Admission date: 06/02/2017 Discharge Date 06/07/2017 Primary MD Revelo, Elyse Jarvis, MD Admitting Physician Demetrios Loll, MD  Admission Diagnosis  Gastrointestinal hemorrhage with hematemesis [K92.0] Anemia, unspecified type [D64.9]  Discharge Diagnosis   Active Problems: GI bleed Nonworking hemodialysis fistula Acute hypoxic respiratory failure due to acute on chronic diastolic CHF Coronary artery disease Essential hypertension Severe MR and pulmonary hypertension Liver cirrhosis Recurrent ascites     Hospital Course  Patient well known to our service with multiple admissions who was just recently hospitalized and return back with GI bleed anemia and acute hypoxic respiratory failure.  Patient was admitted for further evaluation was done including EGD.  Vascular was working on his dialysis access.  Patient earlier today underwent insertion of tunneled dialysis catheter right internal jugular approach.  Postprocedure he came out to the room as usual and left AGAINST MEDICAL ADVICE.            Total Time in preparing paper work, data evaluation and todays exam - 65 minutes  Dustin Flock M.D on 06/07/2017 at 7:58 AM [place]  [phone]

## 2017-06-07 NOTE — Progress Notes (Signed)
[place] at [place] NAME: Stanley [name]    MR#:  [id number]  DATE OF BIRTH:  Apr 13, 1960  SUBJECTIVE:   Pt awating vascular procedure very anxious   REVIEW OF SYSTEMS:    Review of Systems  Constitutional: Negative for fever, chills weight loss HENT: Negative for ear pain, nosebleeds, congestion, facial swelling, rhinorrhea, neck pain, neck stiffness and ear discharge.   Respiratory: Positive shortness of breath no wheezing or cough  cardiovascular: Negative for chest pain, palpitations and leg swelling.  Gastrointestinal: Negative for heartburn, abdominal pain, vomiting, diarrhea or consitpation Genitourinary: Negative for dysuria, urgency, frequency, hematuria Musculoskeletal: Negative for back pain or joint pain Neurological: Negative for dizziness, seizures, syncope, focal weakness,  numbness and headaches.  Hematological: Does not bruise/bleed easily.  Psychiatric/Behavioral: Negative for hallucinations, confusion, dysphoric mood    Tolerating Diet: yes      DRUG ALLERGIES:  No Known Allergies  VITALS:  Blood pressure 122/67, pulse 99, temperature 99.1 F (37.3 C), temperature source Oral, resp. rate 15, height 6\' 3"  (1.905 m), weight 157 lb (71.2 kg), SpO2 100 %.  PHYSICAL EXAMINATION:  Constitutional: Appears thin and frail. No distress. HENT: Normocephalic. Marland Kitchen Oropharynx is clear and moist.  Eyes: Conjunctivae and EOM are normal. PERRLA, no scleral icterus.  Neck: Normal ROM. Neck supple. No JVD. No tracheal deviation. CVS: RRR, S1/S2 +, 3/6 HSM, no gallops, no carotid bruit.  Pulmonary: Normal respiratory effort with crackles heard left base  abdominal: Slight distention without rebound or guarding  musculoskeletal: Normal range of motion. No edema and no tenderness.  Neuro: Alert. CN 2-12 grossly intact. No focal deficits. Skin: Skin is warm and dry. No rash noted. Psychiatric: Normal mood and affect.       LABORATORY PANEL:   CBC Recent Labs  Lab 06/06/17 0534  06/06/17 1748  WBC 11.0*  --   --   HGB 7.9*   < > 8.9*  HCT 25.4*  --   --   PLT 261  --   --    < > = values in this interval not displayed.   ------------------------------------------------------------------------------------------------------------------  Chemistries  Recent Labs  Lab 06/02/17 1352  06/05/17 0304 06/05/17 1110  NA 136   < > 136  --   K 6.4*   < > 4.4  --   CL 100*   < > 99*  --   CO2 26   < > 28  --   GLUCOSE 88   < > 98  --   BUN 55*   < > 34*  --   CREATININE 7.59*   < > 4.79*  --   CALCIUM 8.5*   < > 7.2*  --   AST 38  --   --   --   ALT 37  --   --   --   ALKPHOS 172*  --   --   --   BILITOT 0.5  --   --  0.5   < > = values in this interval not displayed.   ------------------------------------------------------------------------------------------------------------------  Cardiac Enzymes No results for input(s): TROPONINI in the last 168 hours. ------------------------------------------------------------------------------------------------------------------  RADIOLOGY:  US Paracentesis  Result Date: 06/05/2017 INDICATION: Ascites EXAM: ULTRASOUND GUIDED THERAPEUTIC PARACENTESIS MEDICATIONS: None. COMPLICATIONS: None immediate. PROCEDURE: Informed written consent was obtained from the patient after a discussion of the risks, benefits and alternatives to treatment. A timeout was performed prior to the initiation of the procedure. Initial ultrasound scanning demonstrates  a large amount of ascites within the right lower abdominal quadrant. The right lower abdomen was prepped and draped in the usual sterile fashion. 1% lidocaine was used for local anesthesia. Following this, a 6 Fr Safe-T-Centesis catheter was introduced. An ultrasound image was saved for documentation purposes. The paracentesis was performed. The catheter was removed and a dressing was applied. The patient tolerated the  procedure well without immediate post procedural complication. FINDINGS: A total of approximately 3.3 L of clear yellow fluid was removed. Samples were sent to the laboratory as requested by the clinical team. IMPRESSION: Successful ultrasound-guided paracentesis yielding 3.3 liters of peritoneal fluid. Electronically Signed   By: Inez Catalina M.D.   On: 06/05/2017 11:50     ASSESSMENT AND PLAN:   57 year old male with end-stage renal disease on hemodialysis, alcoholic cirrhosis, severe pulmonary hypertension with moderate to severe MR, chronic diastolic heart failure with preserved ejection fraction and CAD who presents with hematemesis  1.  Upper GI bleed: Endoscopy shows LA grade 2 reflux esophagitis and grade 1 esophageal varices with no recent or active GI bleeding Recommendations to continue PPI  Repeat EGD for today, small bowel cannot be evaluated due to large amount of food in the stomach Avoid NSAIDs except aspirin Continue Rocephin in the setting of ascites and GI bleed   2.  Acute on chronic blood loss anemia due to LAD grade 2 esophagitis Patient is status post 2 unit PRBCs Hemoglobin remains relatively stable Anemia panel consistent with chronic disease Continue Epogen with dialysis Repeat cbc in am  3.  [name] with hyperkalemia: Patient underwent emergent dialysis on admission and will require dialysis today since he received IV contrast yesterday for CT scan and chest x-ray showing pulmonary edema  Normal schedule of dialysis Monday, Wednesday and Friday   4.  Acute hypoxic respiratory failure due to acute on chronic diastolic heart failure with preserved ejection fraction without signs of exacerbation Patient will have dialysis today.   Case discussed with Dr. [name]  Wean oxygen to room air no ACEI/ARB given ESRD as per his cardiologist  5.  CAD/CABG: Continue atorvastatin and ASA ok to start as per GI   6.  Essential  hypertension: Continue labetalol   7. Severe MR/pulmonary HTM: Follows with Cardiology Cardiology is Considering Metoprolol in place of labetolol, but will defer this to his outpatient cardioloigst  8. Liver cirrhosis: GI is recommending paracentesis. May plan for tomorrow since he is getting HD today Patient does not feel like he has a lot of fluid build up in abdomen  Management plans discussed with the patient and he is in agreement.  CODE STATUS: FULL  TOTAL TIME TAKING CARE OF THIS PATIENT: 24 minutes.   D/w dr [name]  POSSIBLE D/C 2-3 days DEPENDING ON CLINICAL CONDITION.   Dustin Flock M.D on 06/07/2017 at 7:53 AM  Between 7am to 6pm - Pager - [phone] After 6pm go to [email or web address] - password [email or web address] [place] [facility]  Office  [phone]  CC: Primary care physician; [name], MD  Note: This dictation was prepared with Dragon dictation along with smaller phrase technology. Any transcriptional errors that result from this process are unintentional.

## 2017-06-08 LAB — BODY FLUID CULTURE: CULTURE: NO GROWTH

## 2017-06-09 ENCOUNTER — Encounter: Payer: Self-pay | Admitting: Emergency Medicine

## 2017-06-09 ENCOUNTER — Other Ambulatory Visit: Payer: Self-pay

## 2017-06-09 ENCOUNTER — Emergency Department
Admission: EM | Admit: 2017-06-09 | Discharge: 2017-06-09 | Disposition: A | Discharge status: Home / Self Care | Payer: Medicare Other | Source: Home / Self Care | Attending: Emergency Medicine | Admitting: Emergency Medicine

## 2017-06-09 DIAGNOSIS — I509 Heart failure, unspecified: Secondary | ICD-10-CM

## 2017-06-09 DIAGNOSIS — I2510 Atherosclerotic heart disease of native coronary artery without angina pectoris: Secondary | ICD-10-CM | POA: Insufficient documentation

## 2017-06-09 DIAGNOSIS — Z992 Dependence on renal dialysis: Secondary | ICD-10-CM

## 2017-06-09 DIAGNOSIS — K922 Gastrointestinal hemorrhage, unspecified: Secondary | ICD-10-CM | POA: Diagnosis not present

## 2017-06-09 DIAGNOSIS — N189 Chronic kidney disease, unspecified: Secondary | ICD-10-CM | POA: Diagnosis not present

## 2017-06-09 DIAGNOSIS — K921 Melena: Secondary | ICD-10-CM | POA: Diagnosis not present

## 2017-06-09 DIAGNOSIS — E1140 Type 2 diabetes mellitus with diabetic neuropathy, unspecified: Secondary | ICD-10-CM

## 2017-06-09 DIAGNOSIS — I132 Hypertensive heart and chronic kidney disease with heart failure and with stage 5 chronic kidney disease, or end stage renal disease: Secondary | ICD-10-CM | POA: Insufficient documentation

## 2017-06-09 DIAGNOSIS — Z951 Presence of aortocoronary bypass graft: Secondary | ICD-10-CM | POA: Insufficient documentation

## 2017-06-09 DIAGNOSIS — F17210 Nicotine dependence, cigarettes, uncomplicated: Secondary | ICD-10-CM | POA: Insufficient documentation

## 2017-06-09 DIAGNOSIS — Z681 Body mass index (BMI) 19 or less, adult: Secondary | ICD-10-CM | POA: Diagnosis not present

## 2017-06-09 DIAGNOSIS — Z79899 Other long term (current) drug therapy: Secondary | ICD-10-CM

## 2017-06-09 DIAGNOSIS — J449 Chronic obstructive pulmonary disease, unspecified: Secondary | ICD-10-CM | POA: Insufficient documentation

## 2017-06-09 DIAGNOSIS — N186 End stage renal disease: Secondary | ICD-10-CM

## 2017-06-09 DIAGNOSIS — N2581 Secondary hyperparathyroidism of renal origin: Secondary | ICD-10-CM | POA: Diagnosis not present

## 2017-06-09 DIAGNOSIS — E1122 Type 2 diabetes mellitus with diabetic chronic kidney disease: Secondary | ICD-10-CM | POA: Insufficient documentation

## 2017-06-09 DIAGNOSIS — E43 Unspecified severe protein-calorie malnutrition: Secondary | ICD-10-CM | POA: Diagnosis not present

## 2017-06-09 DIAGNOSIS — I120 Hypertensive chronic kidney disease with stage 5 chronic kidney disease or end stage renal disease: Secondary | ICD-10-CM | POA: Diagnosis not present

## 2017-06-09 DIAGNOSIS — E875 Hyperkalemia: Secondary | ICD-10-CM | POA: Diagnosis not present

## 2017-06-09 LAB — CBC
HEMATOCRIT: 25.6 % — AB (ref 40.0–52.0)
HEMOGLOBIN: 7.9 g/dL — AB (ref 13.0–18.0)
MCH: 26.6 pg (ref 26.0–34.0)
MCHC: 30.7 g/dL — AB (ref 32.0–36.0)
MCV: 86.6 fL (ref 80.0–100.0)
Platelets: 367 10*3/uL (ref 150–440)
RBC: 2.96 MIL/uL — AB (ref 4.40–5.90)
RDW: 17.4 % — ABNORMAL HIGH (ref 11.5–14.5)
WBC: 7.3 10*3/uL (ref 3.8–10.6)

## 2017-06-09 LAB — RENAL FUNCTION PANEL
ANION GAP: 12 (ref 5–15)
Albumin: 2.3 g/dL — ABNORMAL LOW (ref 3.5–5.0)
BUN: 34 mg/dL — ABNORMAL HIGH (ref 6–20)
CHLORIDE: 98 mmol/L — AB (ref 101–111)
CO2: 29 mmol/L (ref 22–32)
Calcium: 7.9 mg/dL — ABNORMAL LOW (ref 8.9–10.3)
Creatinine, Ser: 6.3 mg/dL — ABNORMAL HIGH (ref 0.61–1.24)
GFR calc non Af Amer: 9 mL/min — ABNORMAL LOW (ref >60)
GFR, EST AFRICAN AMERICAN: 10 mL/min — AB (ref >60)
GLUCOSE: 117 mg/dL — AB (ref 65–99)
POTASSIUM: 3.9 mmol/L (ref 3.5–5.1)
Phosphorus: 2.1 mg/dL — ABNORMAL LOW (ref 2.5–4.6)
SODIUM: 139 mmol/L (ref 135–145)

## 2017-06-09 LAB — PHOSPHORUS: PHOSPHORUS: 3 mg/dL (ref 2.5–4.6)

## 2017-06-09 MED ORDER — HEPARIN SODIUM (PORCINE) 1000 UNIT/ML DIALYSIS
1000.0000 [IU] | INTRAMUSCULAR | Status: DC | PRN
  Filled 2017-06-09: qty 1

## 2017-06-09 MED ORDER — ALTEPLASE 2 MG IJ SOLR
2.0000 mg | Freq: Once | INTRAMUSCULAR | Status: DC | PRN
  Filled 2017-06-09: qty 2

## 2017-06-09 MED ORDER — LIDOCAINE HCL (PF) 1 % IJ SOLN: 5.0000 mL | INTRAMUSCULAR | Status: DC | PRN

## 2017-06-09 MED ORDER — LIDOCAINE-PRILOCAINE 2.5-2.5 % EX CREA: 1.0000 "application " | TOPICAL_CREAM | CUTANEOUS | Status: DC | PRN

## 2017-06-09 MED ORDER — SODIUM CHLORIDE 0.9 % IV SOLN: 100.0000 mL | INTRAVENOUS | Status: DC | PRN

## 2017-06-09 MED ORDER — EPOETIN ALFA 10000 UNIT/ML IJ SOLN
10000.0000 [IU] | Freq: Once | INTRAMUSCULAR | Status: AC
  Administered 2017-06-09: 10000 [IU] via INTRAVENOUS

## 2017-06-09 MED ORDER — PENTAFLUOROPROP-TETRAFLUOROETH EX AERO: 1.0000 "application " | INHALATION_SPRAY | CUTANEOUS | Status: DC | PRN

## 2017-06-09 NOTE — Progress Notes (Signed)
HD tx start   06/09/17 1420  Vital Signs  Pulse Rate 86  Pulse Rate Source Monitor  Resp 19  BP 114/62  BP Location Left Arm  BP Method Automatic  Patient Position (if appropriate) Lying  Oxygen Therapy  SpO2 98 %  O2 Device Room Air  During Hemodialysis Assessment  Blood Flow Rate (mL/min) 400 mL/min  Arterial Pressure (mmHg) -120 mmHg  Venous Pressure (mmHg) 100 mmHg  Transmembrane Pressure (mmHg) 70 mmHg  Ultrafiltration Rate (mL/min) 670 mL/min  Dialysate Flow Rate (mL/min) 800 ml/min  Conductivity: Machine  14.2  HD Safety Checks Performed Yes  Dialysis Fluid Bolus Normal Saline  Bolus Amount (mL) 250 mL  Intra-Hemodialysis Comments Tx initiated

## 2017-06-09 NOTE — ED Triage Notes (Signed)
Patient here for dialysis. 

## 2017-06-09 NOTE — Progress Notes (Signed)
Pre HD assessment   06/09/17 1418  Neurological  Level of Consciousness Responds to Pain  Orientation Level Oriented X4  Respiratory  Respiratory Pattern Regular;Unlabored  Chest Assessment Chest expansion symmetrical  Cardiac  Jugular Venous Distention (JVD) Yes  Vascular  R Radial Pulse +2  L Radial Pulse +2  Integumentary  Integumentary (WDL) X  Skin Color Appropriate for ethnicity  Musculoskeletal  Musculoskeletal (WDL) X  Generalized Weakness Yes  Assistive Device None  GU Assessment  Genitourinary (WDL) X  Genitourinary Symptoms  (HD)  Psychosocial  Psychosocial (WDL) WDL

## 2017-06-09 NOTE — ED Notes (Signed)
PT to dialysis at this time.

## 2017-06-09 NOTE — Progress Notes (Signed)
Post dialysis assessment 

## 2017-06-09 NOTE — ED Notes (Signed)
Pt refusing d/c vitals at this time.  Pt ambulatory at discharge

## 2017-06-09 NOTE — Progress Notes (Signed)
Hd completed 

## 2017-06-09 NOTE — ED Provider Notes (Signed)
[facility] Emergency Department Provider Note ____________________________________________   I have reviewed the triage vital signs and the triage nursing note.  HISTORY  Chief Complaint routine dialysis   Historian Patient  HPI Stanley [name] is a 57 y.o. male presents for routine dialysis, as patient does not have dialysis access anywhere as an outpatient secondary to behavioral issues in the past.  Patient reports last dialysis was on Tuesday, today being Friday.  No acute complaints in terms of no recent illnesses, chest pain, coughing, trouble breathing, headache, abdominal pain, vomiting or diarrhea.       Past Medical History:  Diagnosis Date  . Alcohol abuse   . CHF (congestive heart failure) ([place])   . Cirrhosis ([place])   . Coronary artery disease 2009  . Diabetic peripheral neuropathy ([place])   . Drug abuse ([place])   . End stage renal disease on dialysis [facility]) NEPHROLOGIST-   DR [facility]  IN [place]   HEMODIALYSIS --   TUES/  THURS/  SAT  . GERD (gastroesophageal reflux disease)   . Hyperlipidemia   . Hypertension   . PAD (peripheral artery disease) ([place])   . Renal insufficiency    Per pt, 32 oz fluid restriction per day  . S/P triple vessel bypass 06/09/2016   2009ish  . Suicidal ideation    & HOMICIDAL IDEATION --  06-16-2013   ADMITTED TO BEHAVIOR HEALTH    Patient Active Problem List   Diagnosis Date Noted  . Malnutrition of moderate degree 06/05/2017  . Secondary esophageal varices without bleeding ([place])   . Stomach irritation   . Idiopathic esophageal varices without bleeding ([place])   . Alcoholic hepatitis with ascites 05/24/2017  . ESRD (end stage renal disease) ([place]) 04/28/2017  . Uremia 03/08/2017  . Hemodialysis patient ([place]) 03/03/2017  . Weakness 02/28/2017  . Hypocalcemia 02/22/2017  . Shortness of breath 11/26/2016  . COPD (chronic obstructive pulmonary disease) ([place]) 10/30/2016  . COPD exacerbation ([place])  10/29/2016  . Anemia   . Heme positive stool   . Ulceration of intestine   . Benign neoplasm of transverse colon   . Acute gastrointestinal hemorrhage   . Esophageal candidiasis ([place])   . Angiodysplasia of intestinal tract   . Acute respiratory failure with hypoxia ([place]) 07/03/2016  . GI bleeding 06/24/2016  . Rectal bleeding 06/14/2016  . Anemia of chronic disease 06/01/2016  . MRSA carrier 06/01/2016  . Chronic renal failure 05/23/2016  . Ischemic heart disease 05/23/2016  . Angiodysplasia of small intestine   . Melena   . Small bowel bleed not requiring more than 4 units of blood in 24 hours, ICU, or surgery   . Anemia due to chronic blood loss   . Abdominal pain 05/05/2016  . Acute posthemorrhagic anemia 04/17/2016  . Gastrointestinal bleed 04/17/2016  . History of esophagogastroduodenoscopy (EGD) 04/17/2016  . Elevated troponin 04/17/2016  . Alcohol abuse 04/17/2016  . Upper GI bleed 01/19/2016  . Blood in stool   . Angiodysplasia of stomach and duodenum with hemorrhage   . Gastritis   . Reflux esophagitis   . GI bleed 05/16/2015  . Acute GI bleeding   . Symptomatic anemia 04/30/2015  . HTN (hypertension) 04/06/2015  . GERD (gastroesophageal reflux disease) 04/06/2015  . HLD (hyperlipidemia) 04/06/2015  . Dyspnea 04/06/2015  . Cirrhosis of liver with ascites ([place]) 04/06/2015  . Ascites 04/06/2015  . GIB (gastrointestinal bleeding) 03/23/2015  . Homicidal ideation 06/19/2013  . Suicidal intent 06/19/2013  . Homicidal ideations  06/19/2013  . Hyperkalemia 06/16/2013  . Mandible fracture ([place]) 06/05/2013  . Fracture, mandible ([place]) 06/02/2013  . Coronary atherosclerosis of native coronary artery 06/02/2013  . ESRD on dialysis ([place]) 06/02/2013  . Mandible open fracture ([place]) 06/02/2013    Past Surgical History:  Procedure Laterality Date  . A/V FISTULAGRAM Right 06/06/2017   Procedure: A/V FISTULAGRAM;  Surgeon: Katha Cabal, MD;  Location: [place] [facility]  [facility];  Service: Cardiovascular;  Laterality: Right;  . A/V SHUNT INTERVENTION N/A 06/06/2017   Procedure: A/V SHUNT INTERVENTION;  Surgeon: Katha Cabal, MD;  Location: [place] [facility] LAB;  Service: Cardiovascular;  Laterality: N/A;  . AGILE CAPSULE N/A 06/19/2016   Procedure: AGILE CAPSULE;  Surgeon: [name], MD;  Location: [facility] ENDOSCOPY;  Service: Endoscopy;  Laterality: N/A;  . COLONOSCOPY WITH PROPOFOL N/A 06/18/2016   Procedure: COLONOSCOPY WITH PROPOFOL;  Surgeon: [name], MD;  Location: [facility] ENDOSCOPY;  Service: Endoscopy;  Laterality: N/A;  . COLONOSCOPY WITH PROPOFOL N/A 08/12/2016   Procedure: COLONOSCOPY WITH PROPOFOL;  Surgeon: [name], MD;  Location: [facility] ENDOSCOPY;  Service: Endoscopy;  Laterality: N/A;  . COLONOSCOPY WITH PROPOFOL N/A 05/05/2017   Procedure: COLONOSCOPY WITH PROPOFOL;  Surgeon: Manya Silvas, MD;  Location: [facility] ENDOSCOPY;  Service: Endoscopy;  Laterality: N/A;  . CORONARY ANGIOPLASTY  ?   PT UNABLE TO TELL IF  BEFORE OR AFTER  CABG  . CORONARY ARTERY BYPASS GRAFT  2008  ([place])   3 VESSEL  . DIALYSIS FISTULA CREATION  LAST SURGERY  APPOX  2008  . ENTEROSCOPY N/A 05/10/2016   Procedure: ENTEROSCOPY;  Surgeon: Jerene Bears, MD;  Location: [place];  Service: Gastroenterology;  Laterality: N/A;  . ENTEROSCOPY N/A 08/12/2016   Procedure: ENTEROSCOPY;  Surgeon: [name], MD;  Location: [facility] ENDOSCOPY;  Service: Endoscopy;  Laterality: N/A;  . ENTEROSCOPY Left 06/03/2017   Procedure: ENTEROSCOPY;  Surgeon: Virgel Manifold, MD;  Location: [facility] ENDOSCOPY;  Service: Endoscopy;  Laterality: Left;  Procedure date will ultimately depend on when patient is medically optimized before the procedure, pending hemodialysis and blood transfusions etc. Will place on schedule and change depending on clinical status.   . ENTEROSCOPY N/A 06/05/2017   Procedure: ENTEROSCOPY;  Surgeon: Virgel Manifold, MD;  Location: [facility] ENDOSCOPY;  Service:  Endoscopy;  Laterality: N/A;  . ESOPHAGOGASTRODUODENOSCOPY N/A 05/07/2015   Procedure: ESOPHAGOGASTRODUODENOSCOPY (EGD);  Surgeon: Hulen Luster, MD;  Location: [facility] ENDOSCOPY;  Service: Endoscopy;  Laterality: N/A;  . ESOPHAGOGASTRODUODENOSCOPY (EGD) WITH PROPOFOL N/A 05/17/2015   Procedure: ESOPHAGOGASTRODUODENOSCOPY (EGD) WITH PROPOFOL;  Surgeon: [name], MD;  Location: [facility] ENDOSCOPY;  Service: Endoscopy;  Laterality: N/A;  . ESOPHAGOGASTRODUODENOSCOPY (EGD) WITH PROPOFOL N/A 01/20/2016   Procedure: ESOPHAGOGASTRODUODENOSCOPY (EGD) WITH PROPOFOL;  Surgeon: [name], MD;  Location: [facility] ENDOSCOPY;  Service: Endoscopy;  Laterality: N/A;  . ESOPHAGOGASTRODUODENOSCOPY (EGD) WITH PROPOFOL N/A 04/17/2016   Procedure: ESOPHAGOGASTRODUODENOSCOPY (EGD) WITH PROPOFOL;  Surgeon: Lin Landsman, MD;  Location: [facility] ENDOSCOPY;  Service: Gastroenterology;  Laterality: N/A;  . ESOPHAGOGASTRODUODENOSCOPY (EGD) WITH PROPOFOL  05/09/2016   Procedure: ESOPHAGOGASTRODUODENOSCOPY (EGD) WITH PROPOFOL;  Surgeon: Jerene Bears, MD;  Location: [place];  Service: Endoscopy;;  . ESOPHAGOGASTRODUODENOSCOPY (EGD) WITH PROPOFOL N/A 06/16/2016   Procedure: ESOPHAGOGASTRODUODENOSCOPY (EGD) WITH PROPOFOL;  Surgeon: [name], MD;  Location: [facility] ENDOSCOPY;  Service: Endoscopy;  Laterality: N/A;  . ESOPHAGOGASTRODUODENOSCOPY (EGD) WITH PROPOFOL N/A 05/05/2017   Procedure: ESOPHAGOGASTRODUODENOSCOPY (EGD) WITH PROPOFOL;  Surgeon: Manya Silvas, MD;  Location: [facility]  ENDOSCOPY;  Service: Endoscopy;  Laterality: N/A;  . GIVENS CAPSULE STUDY N/A 05/07/2016   Procedure: GIVENS CAPSULE STUDY;  Surgeon: Doran Stabler, MD;  Location: [place];  Service: Endoscopy;  Laterality: N/A;  . MANDIBULAR HARDWARE REMOVAL N/A 07/29/2013   Procedure: REMOVAL OF ARCH BARS;  Surgeon: Theodoro Kos, DO;  Location: [place];  Service: Plastics;  Laterality: N/A;  . ORIF MANDIBULAR FRACTURE N/A 06/05/2013   Procedure:  REPAIR OF MANDIBULAR FRACTURE x 2 with maxillo-mandibular fixation ;  Surgeon: Theodoro Kos, DO;  Location: [place];  Service: Plastics;  Laterality: N/A;  . PERIPHERAL ARTERIAL STENT GRAFT Left     Prior to Admission medications   Medication Sig Start Date End Date Taking? Authorizing Provider  albuterol (PROVENTIL HFA;VENTOLIN HFA) 108 (90 Base) MCG/ACT inhaler Inhale 2 puffs into the lungs every 6 (six) hours as needed for wheezing or shortness of breath. 04/21/17   Lisa Roca, MD  alum & mag hydroxide-simeth (MAALOX/MYLANTA) 200-200-20 MG/5ML suspension Take 15 mLs every 4 (four) hours as needed by mouth for indigestion or heartburn. Patient not taking: Reported on 04/19/2017 01/25/17   Demetrios Loll, MD  atorvastatin (LIPITOR) 40 MG tablet Take 1 tablet (40 mg total) by mouth daily. 11/16/16   Vaughan Basta, MD  budesonide-formoterol (SYMBICORT) 160-4.5 MCG/ACT inhaler Inhale 2 puffs into the lungs daily. 11/16/16   Vaughan Basta, MD  calcium acetate (PHOSLO) 667 MG capsule Take 2,001 mg by mouth 3 (three) times daily.    [provider]  ferrous sulfate 325 (65 FE) MG tablet Take 1 tablet by mouth 3 (three) times daily. 01/21/17   [provider]  furosemide (LASIX) 80 MG tablet Take 1 tablet (80 mg total) daily by mouth. 01/25/17   Demetrios Loll, MD  gabapentin (NEURONTIN) 300 MG capsule Take 1 capsule by mouth daily as directed 08/16/16   [provider]  ipratropium-albuterol (DUONEB) 0.5-2.5 (3) MG/3ML SOLN Take 3 mLs by nebulization every 6 (six) hours as needed. 11/28/16   Loletha Grayer, MD  labetalol (NORMODYNE) 100 MG tablet Take 100 mg by mouth daily. 03/24/17   [provider]  Multiple Vitamins-Minerals-FA (DIALYVITE SUPREME D) 3 MG TABS Take 1 tablet (3 mg total) by mouth daily. 11/28/16   Loletha Grayer, MD  nitroGLYCERIN (NITROSTAT) 0.4 MG SL tablet Place 1 tablet (0.4 mg total) under the tongue every 5 (five) minutes as needed. 04/12/16    Wende Bushy, MD  pantoprazole (PROTONIX) 40 MG tablet Take 1 tablet (40 mg total) by mouth daily. 11/28/16   Loletha Grayer, MD  Spacer/Aero Chamber Mouthpiece MISC 1 Units by Does not apply route every 4 (four) hours as needed (wheezing). 02/19/17   Darel Hong, MD  tiotropium (SPIRIVA HANDIHALER) 18 MCG inhalation capsule Place 1 capsule (18 mcg total) into inhaler and inhale daily. 11/16/16 11/16/17  Vaughan Basta, MD    No Known Allergies  Family History  Problem Relation Age of Onset  . Colon cancer Mother   . Cancer Father   . Cancer Sister   . Kidney disease Brother     Social History Social History   Tobacco Use  . Smoking status: Current Every Day Smoker    Packs/day: 0.15    Years: 40.00    Pack years: 6.00    Types: Cigarettes  . Smokeless tobacco: Never Used  Substance Use Topics  . Alcohol use: No    Comment: pt reports quitting after learning about cirrhosis  . Drug use: No  Frequency: 7.0 times per week    Types: Marijuana, Cocaine    Review of Systems  Constitutional: Negative for fever. Eyes: Negative for visual changes. ENT: Negative for sore throat. Cardiovascular: Negative for chest pain. Respiratory: Negative for shortness of breath. Gastrointestinal: Negative for abdominal pain, vomiting and diarrhea. Genitourinary: Negative for dysuria. Musculoskeletal: Negative for back pain. Skin: Negative for rash. Neurological: Negative for headache.  ____________________________________________   PHYSICAL EXAM:  VITAL SIGNS: ED Triage Vitals [06/09/17 1229]  Enc Vitals Group     BP (!) 106/58     Pulse Rate 88     Resp 14     Temp 98.8 F (37.1 C)     Temp Source Oral     SpO2 98 %     Weight 157 lb (71.2 kg)     Height 6\' 3"  (1.905 m)     Head Circumference      Peak Flow      Pain Score 0     Pain Loc      Pain Edu?      Excl. in [place]?      Constitutional: Alert and oriented. Well appearing and in no distress. HEENT       Head: Normocephalic and atraumatic.      Eyes: Conjunctivae are normal. Pupils equal and round.       Ears:         Nose: No congestion/rhinnorhea.      Mouth/Throat: Mucous membranes are moist.      Neck: No stridor. Cardiovascular/Chest: Normal rate, regular rhythm.  No murmurs, rubs, or gallops. Respiratory: Normal respiratory effort without tachypnea nor retractions. Breath sounds are clear and equal bilaterally. No wheezes/rales/rhonchi. Gastrointestinal: Soft. No distention, no guarding, no rebound. Nontender.    Genitourinary/rectal:Deferred Musculoskeletal: Nontender with normal range of motion in all extremities. No joint effusions.  No lower extremity tenderness.  No edema. Neurologic:  Normal speech and language. No gross or focal neurologic deficits are appreciated. Skin:  Skin is warm, dry and intact. No rash noted. Psychiatric: Mood and affect are normal. Speech and behavior are normal. Patient exhibits appropriate insight and judgment.   ____________________________________________  LABS (pertinent positives/negatives) I, Lisa Roca, MD the attending physician have reviewed the labs noted below.  Labs Reviewed - No data to display   __________________________________________  PROCEDURES  Procedure(s) performed: None  Procedures  Critical Care performed: None   ____________________________________________  ED COURSE / ASSESSMENT AND PLAN  Pertinent labs & imaging results that were available during my care of the patient were reviewed by me and considered in my medical decision making (see chart for details).     Mr. Rhoads presents for routine dialysis 3 days after his last dialysis, without any acute complaints.  Dr. Holley Raring was notified and has placed orders for patient to go down to get his dialysis.   Patient care to be transferred to ED physician at shift change 3pm.  Pending labs and dialysis.  CONSULTATIONS:   Dr. Holley Raring, nephrology for  dialysis.   Patient / Family / Caregiver informed of clinical course, medical decision-making process, and agree with plan.     ___________________________________________   FINAL CLINICAL IMPRESSION(S) / ED DIAGNOSES   Final diagnoses:  Chronic renal failure, unspecified CKD stage      ___________________________________________         Note: This dictation was prepared with Dragon dictation. Any transcriptional errors that result from this process are unintentional     Lisa Roca, MD  06/09/17 1453  

## 2017-06-09 NOTE — ED Notes (Signed)
Pt discharged to home.  No questions or concerns at this time. Pt in NAD.  No items left in ED.

## 2017-06-09 NOTE — ED Notes (Signed)
Patient brought back to ED from Dialysis by this RN.  Patient breathing normally and unlabored.  Patient insisting and getting loud saying "yall need to have this paperwork l ready or I am just gonna start leaving from dialysis".  Kinner MD listened to patient's breathing sounds before discharge.  Patient ambulated and voided independently ptd.

## 2017-06-09 NOTE — ED Provider Notes (Signed)
Back from dialysis and angry, doesn't want to wait for papers. Brief exam performed, lungs CTAB, HR normal. Appears stable for d/c   Lavonia Drafts, MD 06/09/17 1731

## 2017-06-09 NOTE — Progress Notes (Signed)
Pre HD assessment   06/09/17 1416  Vital Signs  Temp 99 F (37.2 C)  Temp Source Oral  Pulse Rate 86  Pulse Rate Source Monitor  Resp 16  BP 110/67  BP Location Left Arm  BP Method Automatic  Patient Position (if appropriate) Lying  Oxygen Therapy  SpO2 96 %  O2 Device Room Air  Pain Assessment  Pain Scale 0-10  Pain Score 0  Dialysis Weight  Weight 71.2 kg (156 lb 15.5 oz)  Type of Weight Pre-Dialysis  Time-Out for Hemodialysis  What Procedure? HD  Pt Identifiers(min of two) First/Last Name;MRN/Account#  Correct Site? Yes  Correct Side? Yes  Correct Procedure? Yes  Consents Verified? Yes  Rad Studies Available? N/A  Safety Precautions Reviewed? Yes  Engineer, civil (consulting) Number  (5A)  Station Number 3  UF/Alarm Test Passed  Conductivity: Meter 13.8  Conductivity: Machine  14.2  pH 7.4  Reverse Osmosis main  Normal Saline Lot Number [id number]  Dialyzer Lot Number [id number]  Disposable Set Lot Number [id number]  Machine Temperature 98.6 F (37 C)  Musician and Audible Yes  Blood Lines Intact and Secured Yes  Pre Treatment Patient Checks  Vascular access used during treatment Catheter  Hepatitis B Surface Antigen Results Negative  Date Hepatitis B Surface Antigen Drawn 03/01/17  Hepatitis B Surface Antibody  (>10)  Date Hepatitis B Surface Antibody Drawn 03/01/17  Hemodialysis Consent Verified Yes  Hemodialysis Standing Orders Initiated Yes  ECG (Telemetry) Monitor On Yes  Prime Ordered Normal Saline  Length of  DialysisTreatment -hour(s) 3.5 Hour(s)  Dialyzer Elisio 17H NR  Dialysate 2K, 2.5 Ca  Dialysis Anticoagulant None  Dialysate Flow Ordered 800  Blood Flow Rate Ordered 400 mL/min  Ultrafiltration Goal 2 Liters  Pre Treatment Labs Renal panel;CBC;Phosphorus  Dialysis Blood Pressure Support Ordered Normal Saline  Education / Care Plan  Dialysis Education Provided Yes  Documented Education in Care Plan Yes

## 2017-06-12 ENCOUNTER — Encounter: Payer: Self-pay | Admitting: Emergency Medicine

## 2017-06-12 ENCOUNTER — Other Ambulatory Visit: Payer: Self-pay

## 2017-06-12 ENCOUNTER — Inpatient Hospital Stay
Admission: EM | Admit: 2017-06-12 | Discharge: 2017-06-16 | DRG: 981 | Disposition: A | Discharge status: Home / Self Care | Payer: Medicare Other | Attending: Internal Medicine | Admitting: Internal Medicine

## 2017-06-12 DIAGNOSIS — T171XXA Foreign body in nostril, initial encounter: Secondary | ICD-10-CM | POA: Diagnosis not present

## 2017-06-12 DIAGNOSIS — Z79899 Other long term (current) drug therapy: Secondary | ICD-10-CM

## 2017-06-12 DIAGNOSIS — J342 Deviated nasal septum: Secondary | ICD-10-CM | POA: Diagnosis present

## 2017-06-12 DIAGNOSIS — I509 Heart failure, unspecified: Secondary | ICD-10-CM | POA: Diagnosis present

## 2017-06-12 DIAGNOSIS — E875 Hyperkalemia: Secondary | ICD-10-CM | POA: Diagnosis present

## 2017-06-12 DIAGNOSIS — K921 Melena: Secondary | ICD-10-CM | POA: Diagnosis present

## 2017-06-12 DIAGNOSIS — Z9119 Patient's noncompliance with other medical treatment and regimen: Secondary | ICD-10-CM

## 2017-06-12 DIAGNOSIS — F17200 Nicotine dependence, unspecified, uncomplicated: Secondary | ICD-10-CM | POA: Diagnosis not present

## 2017-06-12 DIAGNOSIS — R040 Epistaxis: Secondary | ICD-10-CM | POA: Diagnosis present

## 2017-06-12 DIAGNOSIS — Z681 Body mass index (BMI) 19 or less, adult: Secondary | ICD-10-CM | POA: Diagnosis not present

## 2017-06-12 DIAGNOSIS — E1151 Type 2 diabetes mellitus with diabetic peripheral angiopathy without gangrene: Secondary | ICD-10-CM | POA: Diagnosis present

## 2017-06-12 DIAGNOSIS — Z992 Dependence on renal dialysis: Secondary | ICD-10-CM

## 2017-06-12 DIAGNOSIS — I272 Pulmonary hypertension, unspecified: Secondary | ICD-10-CM | POA: Diagnosis present

## 2017-06-12 DIAGNOSIS — K625 Hemorrhage of anus and rectum: Secondary | ICD-10-CM | POA: Diagnosis not present

## 2017-06-12 DIAGNOSIS — Z8711 Personal history of peptic ulcer disease: Secondary | ICD-10-CM

## 2017-06-12 DIAGNOSIS — I132 Hypertensive heart and chronic kidney disease with heart failure and with stage 5 chronic kidney disease, or end stage renal disease: Secondary | ICD-10-CM | POA: Diagnosis present

## 2017-06-12 DIAGNOSIS — R042 Hemoptysis: Secondary | ICD-10-CM | POA: Diagnosis not present

## 2017-06-12 DIAGNOSIS — E43 Unspecified severe protein-calorie malnutrition: Secondary | ICD-10-CM | POA: Diagnosis not present

## 2017-06-12 DIAGNOSIS — D649 Anemia, unspecified: Secondary | ICD-10-CM | POA: Diagnosis not present

## 2017-06-12 DIAGNOSIS — Z951 Presence of aortocoronary bypass graft: Secondary | ICD-10-CM | POA: Diagnosis not present

## 2017-06-12 DIAGNOSIS — D631 Anemia in chronic kidney disease: Secondary | ICD-10-CM | POA: Diagnosis present

## 2017-06-12 DIAGNOSIS — J449 Chronic obstructive pulmonary disease, unspecified: Secondary | ICD-10-CM | POA: Diagnosis present

## 2017-06-12 DIAGNOSIS — Z716 Tobacco abuse counseling: Secondary | ICD-10-CM | POA: Diagnosis not present

## 2017-06-12 DIAGNOSIS — N186 End stage renal disease: Secondary | ICD-10-CM | POA: Diagnosis not present

## 2017-06-12 DIAGNOSIS — E785 Hyperlipidemia, unspecified: Secondary | ICD-10-CM | POA: Diagnosis present

## 2017-06-12 DIAGNOSIS — E1122 Type 2 diabetes mellitus with diabetic chronic kidney disease: Secondary | ICD-10-CM | POA: Diagnosis present

## 2017-06-12 DIAGNOSIS — I2510 Atherosclerotic heart disease of native coronary artery without angina pectoris: Secondary | ICD-10-CM | POA: Diagnosis not present

## 2017-06-12 DIAGNOSIS — E1142 Type 2 diabetes mellitus with diabetic polyneuropathy: Secondary | ICD-10-CM | POA: Diagnosis present

## 2017-06-12 DIAGNOSIS — K7460 Unspecified cirrhosis of liver: Secondary | ICD-10-CM | POA: Diagnosis present

## 2017-06-12 DIAGNOSIS — Z7951 Long term (current) use of inhaled steroids: Secondary | ICD-10-CM

## 2017-06-12 DIAGNOSIS — K922 Gastrointestinal hemorrhage, unspecified: Secondary | ICD-10-CM | POA: Diagnosis not present

## 2017-06-12 DIAGNOSIS — E119 Type 2 diabetes mellitus without complications: Secondary | ICD-10-CM | POA: Diagnosis not present

## 2017-06-12 DIAGNOSIS — Z515 Encounter for palliative care: Secondary | ICD-10-CM | POA: Diagnosis not present

## 2017-06-12 DIAGNOSIS — K920 Hematemesis: Secondary | ICD-10-CM | POA: Diagnosis present

## 2017-06-12 DIAGNOSIS — I10 Essential (primary) hypertension: Secondary | ICD-10-CM | POA: Diagnosis not present

## 2017-06-12 DIAGNOSIS — I120 Hypertensive chronic kidney disease with stage 5 chronic kidney disease or end stage renal disease: Secondary | ICD-10-CM | POA: Diagnosis not present

## 2017-06-12 DIAGNOSIS — Z7189 Other specified counseling: Secondary | ICD-10-CM | POA: Diagnosis not present

## 2017-06-12 DIAGNOSIS — T17228A Food in pharynx causing other injury, initial encounter: Secondary | ICD-10-CM | POA: Diagnosis not present

## 2017-06-12 DIAGNOSIS — K2961 Other gastritis with bleeding: Secondary | ICD-10-CM | POA: Diagnosis not present

## 2017-06-12 DIAGNOSIS — N2581 Secondary hyperparathyroidism of renal origin: Secondary | ICD-10-CM | POA: Diagnosis present

## 2017-06-12 DIAGNOSIS — J441 Chronic obstructive pulmonary disease with (acute) exacerbation: Secondary | ICD-10-CM | POA: Diagnosis not present

## 2017-06-12 DIAGNOSIS — F17210 Nicotine dependence, cigarettes, uncomplicated: Secondary | ICD-10-CM | POA: Diagnosis present

## 2017-06-12 HISTORY — DX: Gastrointestinal hemorrhage, unspecified: K92.2

## 2017-06-12 LAB — BASIC METABOLIC PANEL
ANION GAP: 14 (ref 5–15)
BUN: 74 mg/dL — ABNORMAL HIGH (ref 6–20)
CHLORIDE: 100 mmol/L — AB (ref 101–111)
CO2: 22 mmol/L (ref 22–32)
Calcium: 7.8 mg/dL — ABNORMAL LOW (ref 8.9–10.3)
Creatinine, Ser: 8.76 mg/dL — ABNORMAL HIGH (ref 0.61–1.24)
GFR calc non Af Amer: 6 mL/min — ABNORMAL LOW (ref >60)
GFR, EST AFRICAN AMERICAN: 7 mL/min — AB (ref >60)
Glucose, Bld: 77 mg/dL (ref 65–99)
Potassium: 6.9 mmol/L (ref 3.5–5.1)
SODIUM: 136 mmol/L (ref 135–145)

## 2017-06-12 LAB — HEMOGLOBIN AND HEMATOCRIT, BLOOD
HCT: 15.4 % — ABNORMAL LOW (ref 40.0–52.0)
HEMATOCRIT: 13.7 % — AB (ref 40.0–52.0)
HEMOGLOBIN: 4.3 g/dL — AB (ref 13.0–18.0)
Hemoglobin: 5 g/dL — ABNORMAL LOW (ref 13.0–18.0)

## 2017-06-12 LAB — CBC
HEMATOCRIT: 17.3 % — AB (ref 40.0–52.0)
Hemoglobin: 5.2 g/dL — ABNORMAL LOW (ref 13.0–18.0)
MCH: 26.7 pg (ref 26.0–34.0)
MCHC: 30.2 g/dL — ABNORMAL LOW (ref 32.0–36.0)
MCV: 88.6 fL (ref 80.0–100.0)
Platelets: 374 10*3/uL (ref 150–440)
RBC: 1.95 MIL/uL — AB (ref 4.40–5.90)
RDW: 18.2 % — ABNORMAL HIGH (ref 11.5–14.5)
WBC: 10.5 10*3/uL (ref 3.8–10.6)

## 2017-06-12 LAB — PREPARE RBC (CROSSMATCH)

## 2017-06-12 MED ORDER — SODIUM CHLORIDE 0.9 % IV SOLN
Freq: Once | INTRAVENOUS | Status: AC
  Administered 2017-06-12: 23:00:00 via INTRAVENOUS

## 2017-06-12 MED ORDER — IPRATROPIUM-ALBUTEROL 0.5-2.5 (3) MG/3ML IN SOLN
3.0000 mL | Freq: Once | RESPIRATORY_TRACT | Status: AC
Start: 2017-06-12 — End: 2017-06-12
  Administered 2017-06-12: 3 mL via RESPIRATORY_TRACT
  Filled 2017-06-12: qty 3

## 2017-06-12 MED ORDER — GUAIFENESIN-DM 100-10 MG/5ML PO SYRP
5.0000 mL | ORAL_SOLUTION | ORAL | Status: DC | PRN
  Administered 2017-06-12 – 2017-06-15 (×5): 5 mL via ORAL
  Filled 2017-06-12 (×5): qty 5

## 2017-06-12 MED ORDER — ONDANSETRON HCL 4 MG PO TABS: 4.0000 mg | ORAL_TABLET | Freq: Four times a day (QID) | ORAL | Status: DC | PRN

## 2017-06-12 MED ORDER — SALINE SPRAY 0.65 % NA SOLN
1.0000 | NASAL | Status: DC | PRN
  Administered 2017-06-12: 1 via NASAL
  Filled 2017-06-12: qty 44

## 2017-06-12 MED ORDER — PANTOPRAZOLE SODIUM 40 MG IV SOLR
40.0000 mg | Freq: Two times a day (BID) | INTRAVENOUS | Status: DC
  Administered 2017-06-12: 40 mg via INTRAVENOUS
  Filled 2017-06-12: qty 40

## 2017-06-12 MED ORDER — TRAMADOL HCL 50 MG PO TABS
50.0000 mg | ORAL_TABLET | Freq: Four times a day (QID) | ORAL | Status: DC | PRN
  Administered 2017-06-12 – 2017-06-16 (×6): 50 mg via ORAL
  Filled 2017-06-12 (×6): qty 1

## 2017-06-12 MED ORDER — ALBUTEROL SULFATE (2.5 MG/3ML) 0.083% IN NEBU: 2.5000 mg | INHALATION_SOLUTION | Freq: Four times a day (QID) | RESPIRATORY_TRACT | Status: DC | PRN

## 2017-06-12 MED ORDER — NITROGLYCERIN 0.4 MG SL SUBL: 0.4000 mg | SUBLINGUAL_TABLET | SUBLINGUAL | Status: DC | PRN

## 2017-06-12 MED ORDER — ONDANSETRON HCL 4 MG/2ML IJ SOLN: 4.0000 mg | Freq: Four times a day (QID) | INTRAMUSCULAR | Status: DC | PRN

## 2017-06-12 MED ORDER — SODIUM POLYSTYRENE SULFONATE 15 GM/60ML PO SUSP
15.0000 g | Freq: Once | ORAL | Status: AC
  Administered 2017-06-12: 15 g via ORAL
  Filled 2017-06-12: qty 60

## 2017-06-12 MED ORDER — TIOTROPIUM BROMIDE MONOHYDRATE 18 MCG IN CAPS
18.0000 ug | ORAL_CAPSULE | Freq: Every day | RESPIRATORY_TRACT | Status: DC
  Administered 2017-06-12 – 2017-06-16 (×5): 18 ug via RESPIRATORY_TRACT
  Filled 2017-06-12: qty 5

## 2017-06-12 MED ORDER — IPRATROPIUM-ALBUTEROL 0.5-2.5 (3) MG/3ML IN SOLN
3.0000 mL | Freq: Four times a day (QID) | RESPIRATORY_TRACT | Status: DC
  Administered 2017-06-13 – 2017-06-15 (×9): 3 mL via RESPIRATORY_TRACT
  Filled 2017-06-12 (×10): qty 3

## 2017-06-12 MED ORDER — NICOTINE 7 MG/24HR TD PT24
7.0000 mg | MEDICATED_PATCH | Freq: Every day | TRANSDERMAL | Status: DC
  Administered 2017-06-12 – 2017-06-16 (×5): 7 mg via TRANSDERMAL
  Filled 2017-06-12 (×6): qty 1

## 2017-06-12 NOTE — ED Triage Notes (Signed)
Arrives for regular dialysis

## 2017-06-12 NOTE — H&P (Signed)
[facility] [place] NAME: Stanley [name]    MR#:  [id number]  DATE OF BIRTH:  1960/09/01  DATE OF ADMISSION:  06/12/2017  PRIMARY CARE PHYSICIAN: Alene Mires Elyse Jarvis, MD   REQUESTING/REFERRING PHYSICIAN: Lavonia Drafts, MD  CHIEF COMPLAINT:  RECTAL BLEED and hematemesis  HISTORY OF PRESENT ILLNESS:  Stanley [name]  is a 57 y.o. male with a known history of end-stage renal disease on hemodialysis Monday Wednesday and Friday comes to  emergency department 3 times a week for dialysis is reporting shortness of breath and abdominal pain associated with the rectal bleeding and hematemesis.  Hemoglobin is at 5.2 which was at 7.9 before patient recently had EGD done which has revealed gastritis. Potassium is elevated at 6.9 and a nephrology is consulted fo dialysis     PAST MEDICAL HISTORY:   Past Medical History:  Diagnosis Date  . Alcohol abuse   . CHF (congestive heart failure) ([place])   . Cirrhosis ([place])   . Coronary artery disease 2009  . Diabetic peripheral neuropathy ([place])   . Drug abuse ([place])   . End stage renal disease on dialysis [facility]) NEPHROLOGIST-   [facility]  IN [place]   HEMODIALYSIS --   TUES/  THURS/  SAT  . GERD (gastroesophageal reflux disease)   . Hyperlipidemia   . Hypertension   . PAD (peripheral artery disease) ([place])   . Renal insufficiency    Per pt, 32 oz fluid restriction per day  . S/P triple vessel bypass 06/09/2016   2009ish  . Suicidal ideation    & HOMICIDAL IDEATION --  06-16-2013   ADMITTED TO BEHAVIOR HEALTH    PAST SURGICAL HISTOIRY:   Past Surgical History:  Procedure Laterality Date  . A/V FISTULAGRAM Right 06/06/2017   Procedure: A/V FISTULAGRAM;  Surgeon: Katha Cabal, MD;  Location: [place] CV LAB;  Service: Cardiovascular;  Laterality: Right;  . A/V SHUNT INTERVENTION N/A 06/06/2017   Procedure: A/V SHUNT INTERVENTION;  Surgeon: Katha Cabal, MD;  Location: [place] CV LAB;  Service: Cardiovascular;  Laterality: N/A;  . AGILE CAPSULE N/A 06/19/2016   Procedure: AGILE CAPSULE;  Surgeon: [name], MD;  Location: [facility] ENDOSCOPY;  Service: Endoscopy;  Laterality: N/A;  . COLONOSCOPY WITH PROPOFOL N/A 06/18/2016   Procedure: COLONOSCOPY WITH PROPOFOL;  Surgeon: [name], MD;  Location: [facility] ENDOSCOPY;  Service: Endoscopy;  Laterality: N/A;  . COLONOSCOPY WITH PROPOFOL N/A 08/12/2016   Procedure: COLONOSCOPY WITH PROPOFOL;  Surgeon: [name], MD;  Location: [facility] ENDOSCOPY;  Service: Endoscopy;  Laterality: N/A;  . COLONOSCOPY WITH PROPOFOL N/A 05/05/2017   Procedure: COLONOSCOPY WITH PROPOFOL;  Surgeon: Manya Silvas, MD;  Location: [facility] ENDOSCOPY;  Service: Endoscopy;  Laterality: N/A;  . CORONARY ANGIOPLASTY  ?   PT UNABLE TO TELL IF  BEFORE OR AFTER  CABG  . CORONARY ARTERY BYPASS GRAFT  2008  ([place] , [place])   3 VESSEL  . DIALYSIS FISTULA CREATION  LAST SURGERY  APPOX  2008  . ENTEROSCOPY N/A 05/10/2016   Procedure: ENTEROSCOPY;  Surgeon: Jerene Bears, MD;  Location: [place];  Service: Gastroenterology;  Laterality: N/A;  . ENTEROSCOPY N/A 08/12/2016   Procedure: ENTEROSCOPY;  Surgeon: [name], MD;  Location: [facility] ENDOSCOPY;  Service: Endoscopy;  Laterality: N/A;  . ENTEROSCOPY Left 06/03/2017   Procedure: ENTEROSCOPY;  Surgeon: Virgel Manifold, MD;  Location: [facility] ENDOSCOPY;  Service: Endoscopy;  Laterality: Left;  Procedure date will  ultimately depend on when patient is medically optimized before the procedure, pending hemodialysis and blood transfusions etc. Will place on schedule and change depending on clinical status.   . ENTEROSCOPY N/A 06/05/2017   Procedure: ENTEROSCOPY;  Surgeon: Virgel Manifold, MD;  Location: [facility] ENDOSCOPY;  Service: Endoscopy;  Laterality: N/A;  . ESOPHAGOGASTRODUODENOSCOPY N/A 05/07/2015   Procedure: ESOPHAGOGASTRODUODENOSCOPY (EGD);  Surgeon: Hulen Luster, MD;  Location: [facility] ENDOSCOPY;  Service:  Endoscopy;  Laterality: N/A;  . ESOPHAGOGASTRODUODENOSCOPY (EGD) WITH PROPOFOL N/A 05/17/2015   Procedure: ESOPHAGOGASTRODUODENOSCOPY (EGD) WITH PROPOFOL;  Surgeon: [name], MD;  Location: [facility] ENDOSCOPY;  Service: Endoscopy;  Laterality: N/A;  . ESOPHAGOGASTRODUODENOSCOPY (EGD) WITH PROPOFOL N/A 01/20/2016   Procedure: ESOPHAGOGASTRODUODENOSCOPY (EGD) WITH PROPOFOL;  Surgeon: [name], MD;  Location: [facility] ENDOSCOPY;  Service: Endoscopy;  Laterality: N/A;  . ESOPHAGOGASTRODUODENOSCOPY (EGD) WITH PROPOFOL N/A 04/17/2016   Procedure: ESOPHAGOGASTRODUODENOSCOPY (EGD) WITH PROPOFOL;  Surgeon: Lin Landsman, MD;  Location: [facility] ENDOSCOPY;  Service: Gastroenterology;  Laterality: N/A;  . ESOPHAGOGASTRODUODENOSCOPY (EGD) WITH PROPOFOL  05/09/2016   Procedure: ESOPHAGOGASTRODUODENOSCOPY (EGD) WITH PROPOFOL;  Surgeon: Jerene Bears, MD;  Location: [place];  Service: Endoscopy;;  . ESOPHAGOGASTRODUODENOSCOPY (EGD) WITH PROPOFOL N/A 06/16/2016   Procedure: ESOPHAGOGASTRODUODENOSCOPY (EGD) WITH PROPOFOL;  Surgeon: [name], MD;  Location: [facility] ENDOSCOPY;  Service: Endoscopy;  Laterality: N/A;  . ESOPHAGOGASTRODUODENOSCOPY (EGD) WITH PROPOFOL N/A 05/05/2017   Procedure: ESOPHAGOGASTRODUODENOSCOPY (EGD) WITH PROPOFOL;  Surgeon: Manya Silvas, MD;  Location: [facility] ENDOSCOPY;  Service: Endoscopy;  Laterality: N/A;  . GIVENS CAPSULE STUDY N/A 05/07/2016   Procedure: GIVENS CAPSULE STUDY;  Surgeon: Doran Stabler, MD;  Location: [place];  Service: Endoscopy;  Laterality: N/A;  . MANDIBULAR HARDWARE REMOVAL N/A 07/29/2013   Procedure: REMOVAL OF ARCH BARS;  Surgeon: Theodoro Kos, DO;  Location: [place];  Service: Plastics;  Laterality: N/A;  . ORIF MANDIBULAR FRACTURE N/A 06/05/2013   Procedure: REPAIR OF MANDIBULAR FRACTURE x 2 with maxillo-mandibular fixation ;  Surgeon: Theodoro Kos, DO;  Location: [place];  Service: Plastics;  Laterality: N/A;  . PERIPHERAL ARTERIAL STENT  GRAFT Left     SOCIAL HISTORY:   Social History   Tobacco Use  . Smoking status: Current Every Day Smoker    Packs/day: 0.15    Years: 40.00    Pack years: 6.00    Types: Cigarettes  . Smokeless tobacco: Never Used  Substance Use Topics  . Alcohol use: No    Comment: pt reports quitting after learning about cirrhosis    FAMILY HISTORY:   Family History  Problem Relation Age of Onset  . Colon cancer Mother   . Cancer Father   . Cancer Sister   . Kidney disease Brother     DRUG ALLERGIES:  No Known Allergies  REVIEW OF SYSTEMS:  CONSTITUTIONAL: No fever, fatigue or weakness.  EYES: No blurred or double vision.  EARS, NOSE, AND THROAT: No tinnitus or ear pain.  RESPIRATORY: No cough, shortness of breath, wheezing or hemoptysis.  CARDIOVASCULAR: No chest pain, orthopnea, edema.  GASTROINTESTINAL: Reporting hematemesis, rectal bleed and mid abdominal pain GENITOURINARY: No dysuria, hematuria.  ENDOCRINE: No polyuria, nocturia,  HEMATOLOGY: No anemia, easy bruising or bleeding SKIN: No rash or lesion. MUSCULOSKELETAL: No joint pain or arthritis.   NEUROLOGIC: No tingling, numbness, weakness.  PSYCHIATRY: No anxiety or depression.   MEDICATIONS AT HOME:   Prior to Admission medications   Medication Sig Start Date End Date Taking? Authorizing Provider  albuterol (PROVENTIL  HFA;VENTOLIN HFA) 108 (90 Base) MCG/ACT inhaler Inhale 2 puffs into the lungs every 6 (six) hours as needed for wheezing or shortness of breath. 04/21/17   Lisa Roca, MD  alum & mag hydroxide-simeth (MAALOX/MYLANTA) 200-200-20 MG/5ML suspension Take 15 mLs every 4 (four) hours as needed by mouth for indigestion or heartburn. Patient not taking: Reported on 04/19/2017 01/25/17   Demetrios Loll, MD  atorvastatin (LIPITOR) 40 MG tablet Take 1 tablet (40 mg total) by mouth daily. 11/16/16   Vaughan Basta, MD  budesonide-formoterol (SYMBICORT) 160-4.5 MCG/ACT inhaler Inhale 2 puffs into the lungs daily.  11/16/16   Vaughan Basta, MD  calcium acetate (PHOSLO) 667 MG capsule Take 2,001 mg by mouth 3 (three) times daily.    [provider]  ferrous sulfate 325 (65 FE) MG tablet Take 1 tablet by mouth 3 (three) times daily. 01/21/17   [provider]  furosemide (LASIX) 80 MG tablet Take 1 tablet (80 mg total) daily by mouth. 01/25/17   Demetrios Loll, MD  gabapentin (NEURONTIN) 300 MG capsule Take 1 capsule by mouth daily as directed 08/16/16   [provider]  ipratropium-albuterol (DUONEB) 0.5-2.5 (3) MG/3ML SOLN Take 3 mLs by nebulization every 6 (six) hours as needed. 11/28/16   Loletha Grayer, MD  labetalol (NORMODYNE) 100 MG tablet Take 100 mg by mouth daily. 03/24/17   [provider]  Multiple Vitamins-Minerals-FA (DIALYVITE SUPREME D) 3 MG TABS Take 1 tablet (3 mg total) by mouth daily. 11/28/16   Loletha Grayer, MD  nitroGLYCERIN (NITROSTAT) 0.4 MG SL tablet Place 1 tablet (0.4 mg total) under the tongue every 5 (five) minutes as needed. 04/12/16   Wende Bushy, MD  pantoprazole (PROTONIX) 40 MG tablet Take 1 tablet (40 mg total) by mouth daily. 11/28/16   Loletha Grayer, MD  Spacer/Aero Chamber Mouthpiece MISC 1 Units by Does not apply route every 4 (four) hours as needed (wheezing). 02/19/17   Darel Hong, MD  tiotropium (SPIRIVA HANDIHALER) 18 MCG inhalation capsule Place 1 capsule (18 mcg total) into inhaler and inhale daily. 11/16/16 11/16/17  Vaughan Basta, MD      VITAL SIGNS:  Blood pressure (!) 95/59, pulse 62, temperature 97.8 F (36.6 C), height 6\' 3"  (1.905 m), weight 70.8 kg (156 lb), SpO2 100 %.  PHYSICAL EXAMINATION:  GENERAL:  57 y.o.-year-old patient lying in the bed with no acute distress.  EYES: Pupils equal, round, reactive to light and accommodation. No scleral icterus. Extraocular muscles intact.  HEENT: Head atraumatic, normocephalic. Oropharynx and nasopharynx clear.  NECK:  Supple, no jugular venous distention. No  thyroid enlargement, no tenderness.  LUNGS: Normal breath sounds bilaterally, no wheezing, rales,rhonchi or crepitation. No use of accessory muscles of respiration.  CARDIOVASCULAR: S1, S2 normal. No murmurs, rubs, or gallops.  ABDOMEN: Soft, minimal mid abdominal tenderness no rebound tenderness, nondistended. Bowel sounds present.  EXTREMITIES: No pedal edema, cyanosis, or clubbing.  NEUROLOGIC: Cranial nerves II through XII are intact. Muscle strength 5/5 in all extremities. Sensation intact. Gait not checked.  PSYCHIATRIC: The patient is alert and oriented x 3.  SKIN: No obvious rash, lesion, or ulcer.   LABORATORY PANEL:   CBC Recent Labs  Lab 06/12/17 1427  WBC 10.5  HGB 5.2*  HCT 17.3*  PLT 374   ------------------------------------------------------------------------------------------------------------------  Chemistries  Recent Labs  Lab 06/12/17 1427  NA 136  K 6.9*  CL 100*  CO2 22  GLUCOSE 77  BUN 74*  CREATININE 8.76*  CALCIUM 7.8*   ------------------------------------------------------------------------------------------------------------------  Cardiac Enzymes No results for input(s): TROPONINI in the last 168 hours. ------------------------------------------------------------------------------------------------------------------  RADIOLOGY:  No results found.  EKG:   Orders placed or performed during the hospital encounter of 06/12/17  . ED EKG  . ED EKG  . EKG 12-Lead  . EKG 12-Lead    IMPRESSION AND PLAN:     GI bleed  admit to telemetry Patient is reporting hematemesis and melena  hemoglobin dropped from 7.9-5.2  transfuse PRBC and monitor hemoglobin and hematocrit closely  n.p.o.  Protonix IV GI is consulted and the patient had a recent endoscopy which has revealed gastritis   #Symptomatic anemia Monitor hemoglobin hematocrit closely  Transfuse  #Hyperkalemia Kayexalate was ordered and patient is going for hemodialysis on  urgent basis today nephrology consulted  #End-stage renal disease continue hemodialysis on Monday Wednesday and Friday   #Tobacco abuse disorder counseled patient to quit smoking for 5 minutes patient verbalized understanding of the plan.  Provide nicotine patch  All the records are reviewed and case discussed with ED provider. Management plans discussed with the patient, family and they are in agreement.  CODE STATUS: FC   TOTAL TIME TAKING CARE OF THIS PATIENT: 45  minutes.   Note: This dictation was prepared with Dragon dictation along with smaller phrase technology. Any transcriptional errors that result from this process are unintentional.  Nicholes Mango M.D on 06/12/2017 at 4:15 PM  Between 7am to 6pm - Pager - [phone]  After 6pm go to [email or web address] - password [email or web address] [facility]  Office  [phone]  CC: Primary care physician; [name], MD

## 2017-06-12 NOTE — ED Provider Notes (Addendum)
[facility] Emergency Department Provider Note   ____________________________________________    I have reviewed the triage vital signs and the nursing notes.   HISTORY  Chief Complaint regular dialysis and Rectal Bleeding     HPI Stanley [name] is a 57 y.o. male who presents for dialysis.  Patient comes 3 times a week to the emergency department for dialysis.  He denies shortness of breath.  Last dialysis was on Friday.  He reports he has been having intermittent small amounts of bright red blood after stooling resolves without intervention.  States he has had this many times in the past.  Had a colonoscopy was reportedly normal.  Review of medical records demonstrates the patient had grade 2 reflux esophagitis which is bleeding is attributed to.  Patient apparently left AGAINST MEDICAL ADVICE as well   Past Medical History:  Diagnosis Date  . Alcohol abuse   . CHF (congestive heart failure) ([place])   . Cirrhosis ([place])   . Coronary artery disease 2009  . Diabetic peripheral neuropathy ([place])   . Drug abuse ([place])   . End stage renal disease on dialysis [facility]) NEPHROLOGIST-   DR [facility]  IN [place]   HEMODIALYSIS --   TUES/  THURS/  SAT  . GERD (gastroesophageal reflux disease)   . Hyperlipidemia   . Hypertension   . PAD (peripheral artery disease) ([place])   . Renal insufficiency    Per pt, 32 oz fluid restriction per day  . S/P triple vessel bypass 06/09/2016   2009ish  . Suicidal ideation    & HOMICIDAL IDEATION --  06-16-2013   ADMITTED TO BEHAVIOR HEALTH    Patient Active Problem List   Diagnosis Date Noted  . Malnutrition of moderate degree 06/05/2017  . Secondary esophageal varices without bleeding ([place])   . Stomach irritation   . Idiopathic esophageal varices without bleeding ([place])   . Alcoholic hepatitis with ascites 05/24/2017  . ESRD (end stage renal disease) ([place]) 04/28/2017  . Uremia 03/08/2017  . Hemodialysis patient ([place])  03/03/2017  . Weakness 02/28/2017  . Hypocalcemia 02/22/2017  . Shortness of breath 11/26/2016  . COPD (chronic obstructive pulmonary disease) ([place]) 10/30/2016  . COPD exacerbation ([place]) 10/29/2016  . Anemia   . Heme positive stool   . Ulceration of intestine   . Benign neoplasm of transverse colon   . Acute gastrointestinal hemorrhage   . Esophageal candidiasis ([place])   . Angiodysplasia of intestinal tract   . Acute respiratory failure with hypoxia ([place]) 07/03/2016  . GI bleeding 06/24/2016  . Rectal bleeding 06/14/2016  . Anemia of chronic disease 06/01/2016  . MRSA carrier 06/01/2016  . Chronic renal failure 05/23/2016  . Ischemic heart disease 05/23/2016  . Angiodysplasia of small intestine   . Melena   . Small bowel bleed not requiring more than 4 units of blood in 24 hours, ICU, or surgery   . Anemia due to chronic blood loss   . Abdominal pain 05/05/2016  . Acute posthemorrhagic anemia 04/17/2016  . Gastrointestinal bleed 04/17/2016  . History of esophagogastroduodenoscopy (EGD) 04/17/2016  . Elevated troponin 04/17/2016  . Alcohol abuse 04/17/2016  . Upper GI bleed 01/19/2016  . Blood in stool   . Angiodysplasia of stomach and duodenum with hemorrhage   . Gastritis   . Reflux esophagitis   . GI bleed 05/16/2015  . Acute GI bleeding   . Symptomatic anemia 04/30/2015  . HTN (hypertension) 04/06/2015  . GERD (gastroesophageal reflux disease)  04/06/2015  . HLD (hyperlipidemia) 04/06/2015  . Dyspnea 04/06/2015  . Cirrhosis of liver with ascites ([place]) 04/06/2015  . Ascites 04/06/2015  . GIB (gastrointestinal bleeding) 03/23/2015  . Homicidal ideation 06/19/2013  . Suicidal intent 06/19/2013  . Homicidal ideations 06/19/2013  . Hyperkalemia 06/16/2013  . Mandible fracture ([place]) 06/05/2013  . Fracture, mandible ([place]) 06/02/2013  . Coronary atherosclerosis of native coronary artery 06/02/2013  . ESRD on dialysis ([place]) 06/02/2013  . Mandible open fracture ([place])  06/02/2013    Past Surgical History:  Procedure Laterality Date  . A/V FISTULAGRAM Right 06/06/2017   Procedure: A/V FISTULAGRAM;  Surgeon: Katha Cabal, MD;  Location: [place] CV LAB;  Service: Cardiovascular;  Laterality: Right;  . A/V SHUNT INTERVENTION N/A 06/06/2017   Procedure: A/V SHUNT INTERVENTION;  Surgeon: Katha Cabal, MD;  Location: [place] CV LAB;  Service: Cardiovascular;  Laterality: N/A;  . AGILE CAPSULE N/A 06/19/2016   Procedure: AGILE CAPSULE;  Surgeon: [name], MD;  Location: [facility] ENDOSCOPY;  Service: Endoscopy;  Laterality: N/A;  . COLONOSCOPY WITH PROPOFOL N/A 06/18/2016   Procedure: COLONOSCOPY WITH PROPOFOL;  Surgeon: [name], MD;  Location: [facility] ENDOSCOPY;  Service: Endoscopy;  Laterality: N/A;  . COLONOSCOPY WITH PROPOFOL N/A 08/12/2016   Procedure: COLONOSCOPY WITH PROPOFOL;  Surgeon: [name], MD;  Location: [facility] ENDOSCOPY;  Service: Endoscopy;  Laterality: N/A;  . COLONOSCOPY WITH PROPOFOL N/A 05/05/2017   Procedure: COLONOSCOPY WITH PROPOFOL;  Surgeon: Manya Silvas, MD;  Location: [facility] ENDOSCOPY;  Service: Endoscopy;  Laterality: N/A;  . CORONARY ANGIOPLASTY  ?   PT UNABLE TO TELL IF  BEFORE OR AFTER  CABG  . CORONARY ARTERY BYPASS GRAFT  2008  ([place] , [place])   3 VESSEL  . DIALYSIS FISTULA CREATION  LAST SURGERY  APPOX  2008  . ENTEROSCOPY N/A 05/10/2016   Procedure: ENTEROSCOPY;  Surgeon: Jerene Bears, MD;  Location: [place];  Service: Gastroenterology;  Laterality: N/A;  . ENTEROSCOPY N/A 08/12/2016   Procedure: ENTEROSCOPY;  Surgeon: [name], MD;  Location: [facility] ENDOSCOPY;  Service: Endoscopy;  Laterality: N/A;  . ENTEROSCOPY Left 06/03/2017   Procedure: ENTEROSCOPY;  Surgeon: Virgel Manifold, MD;  Location: [facility] ENDOSCOPY;  Service: Endoscopy;  Laterality: Left;  Procedure date will ultimately depend on when patient is medically optimized before the procedure, pending hemodialysis and blood transfusions etc. Will  place on schedule and change depending on clinical status.   . ENTEROSCOPY N/A 06/05/2017   Procedure: ENTEROSCOPY;  Surgeon: Virgel Manifold, MD;  Location: [facility] ENDOSCOPY;  Service: Endoscopy;  Laterality: N/A;  . ESOPHAGOGASTRODUODENOSCOPY N/A 05/07/2015   Procedure: ESOPHAGOGASTRODUODENOSCOPY (EGD);  Surgeon: Hulen Luster, MD;  Location: [facility] ENDOSCOPY;  Service: Endoscopy;  Laterality: N/A;  . ESOPHAGOGASTRODUODENOSCOPY (EGD) WITH PROPOFOL N/A 05/17/2015   Procedure: ESOPHAGOGASTRODUODENOSCOPY (EGD) WITH PROPOFOL;  Surgeon: [name], MD;  Location: [facility] ENDOSCOPY;  Service: Endoscopy;  Laterality: N/A;  . ESOPHAGOGASTRODUODENOSCOPY (EGD) WITH PROPOFOL N/A 01/20/2016   Procedure: ESOPHAGOGASTRODUODENOSCOPY (EGD) WITH PROPOFOL;  Surgeon: [name], MD;  Location: [facility] ENDOSCOPY;  Service: Endoscopy;  Laterality: N/A;  . ESOPHAGOGASTRODUODENOSCOPY (EGD) WITH PROPOFOL N/A 04/17/2016   Procedure: ESOPHAGOGASTRODUODENOSCOPY (EGD) WITH PROPOFOL;  Surgeon: Lin Landsman, MD;  Location: [facility] ENDOSCOPY;  Service: Gastroenterology;  Laterality: N/A;  . ESOPHAGOGASTRODUODENOSCOPY (EGD) WITH PROPOFOL  05/09/2016   Procedure: ESOPHAGOGASTRODUODENOSCOPY (EGD) WITH PROPOFOL;  Surgeon: Jerene Bears, MD;  Location: [facility] ENDOSCOPY;  Service: Endoscopy;;  . ESOPHAGOGASTRODUODENOSCOPY (EGD) WITH PROPOFOL N/A 06/16/2016   Procedure:  ESOPHAGOGASTRODUODENOSCOPY (EGD) WITH PROPOFOL;  Surgeon: [name], MD;  Location: [facility] ENDOSCOPY;  Service: Endoscopy;  Laterality: N/A;  . ESOPHAGOGASTRODUODENOSCOPY (EGD) WITH PROPOFOL N/A 05/05/2017   Procedure: ESOPHAGOGASTRODUODENOSCOPY (EGD) WITH PROPOFOL;  Surgeon: Manya Silvas, MD;  Location: [facility] ENDOSCOPY;  Service: Endoscopy;  Laterality: N/A;  . GIVENS CAPSULE STUDY N/A 05/07/2016   Procedure: GIVENS CAPSULE STUDY;  Surgeon: Doran Stabler, MD;  Location: [place];  Service: Endoscopy;  Laterality: N/A;  . MANDIBULAR HARDWARE REMOVAL N/A 07/29/2013   Procedure:  REMOVAL OF ARCH BARS;  Surgeon: Theodoro Kos, DO;  Location: [place];  Service: Plastics;  Laterality: N/A;  . ORIF MANDIBULAR FRACTURE N/A 06/05/2013   Procedure: REPAIR OF MANDIBULAR FRACTURE x 2 with maxillo-mandibular fixation ;  Surgeon: Theodoro Kos, DO;  Location: [place];  Service: Plastics;  Laterality: N/A;  . PERIPHERAL ARTERIAL STENT GRAFT Left     Prior to Admission medications   Medication Sig Start Date End Date Taking? Authorizing Provider  albuterol (PROVENTIL HFA;VENTOLIN HFA) 108 (90 Base) MCG/ACT inhaler Inhale 2 puffs into the lungs every 6 (six) hours as needed for wheezing or shortness of breath. 04/21/17   Lisa Roca, MD  alum & mag hydroxide-simeth (MAALOX/MYLANTA) 200-200-20 MG/5ML suspension Take 15 mLs every 4 (four) hours as needed by mouth for indigestion or heartburn. Patient not taking: Reported on 04/19/2017 01/25/17   Demetrios Loll, MD  atorvastatin (LIPITOR) 40 MG tablet Take 1 tablet (40 mg total) by mouth daily. 11/16/16   Vaughan Basta, MD  budesonide-formoterol (SYMBICORT) 160-4.5 MCG/ACT inhaler Inhale 2 puffs into the lungs daily. 11/16/16   Vaughan Basta, MD  calcium acetate (PHOSLO) 667 MG capsule Take 2,001 mg by mouth 3 (three) times daily.    [provider]  ferrous sulfate 325 (65 FE) MG tablet Take 1 tablet by mouth 3 (three) times daily. 01/21/17   [provider]  furosemide (LASIX) 80 MG tablet Take 1 tablet (80 mg total) daily by mouth. 01/25/17   Demetrios Loll, MD  gabapentin (NEURONTIN) 300 MG capsule Take 1 capsule by mouth daily as directed 08/16/16   [provider]  ipratropium-albuterol (DUONEB) 0.5-2.5 (3) MG/3ML SOLN Take 3 mLs by nebulization every 6 (six) hours as needed. 11/28/16   Loletha Grayer, MD  labetalol (NORMODYNE) 100 MG tablet Take 100 mg by mouth daily. 03/24/17   [provider]  Multiple Vitamins-Minerals-FA (DIALYVITE SUPREME D) 3 MG TABS Take 1 tablet (3 mg  total) by mouth daily. 11/28/16   Loletha Grayer, MD  nitroGLYCERIN (NITROSTAT) 0.4 MG SL tablet Place 1 tablet (0.4 mg total) under the tongue every 5 (five) minutes as needed. 04/12/16   Wende Bushy, MD  pantoprazole (PROTONIX) 40 MG tablet Take 1 tablet (40 mg total) by mouth daily. 11/28/16   Loletha Grayer, MD  Spacer/Aero Chamber Mouthpiece MISC 1 Units by Does not apply route every 4 (four) hours as needed (wheezing). 02/19/17   Darel Hong, MD  tiotropium (SPIRIVA HANDIHALER) 18 MCG inhalation capsule Place 1 capsule (18 mcg total) into inhaler and inhale daily. 11/16/16 11/16/17  Vaughan Basta, MD     Allergies Patient has no known allergies.  Family History  Problem Relation Age of Onset  . Colon cancer Mother   . Cancer Father   . Cancer Sister   . Kidney disease Brother     Social History Social History   Tobacco Use  . Smoking status: Current Every Day Smoker    Packs/day: 0.15  Years: 40.00    Pack years: 6.00    Types: Cigarettes  . Smokeless tobacco: Never Used  Substance Use Topics  . Alcohol use: No    Comment: pt reports quitting after learning about cirrhosis  . Drug use: No    Frequency: 7.0 times per week    Types: Marijuana, Cocaine    Review of Systems  Constitutional: No fever/chills  Cardiovascular: Denies chest pain. Respiratory: No breathing difficulty Gastrointestinal: No abdominal pain   Skin: Negative for rash.    ____________________________________________   PHYSICAL EXAM:  VITAL SIGNS: ED Triage Vitals  Enc Vitals Group     BP 06/12/17 1250 (!) 95/59     Pulse Rate 06/12/17 1250 62     Resp --      Temp 06/12/17 1250 97.8 F (36.6 C)     Temp src --      SpO2 06/12/17 1250 100 %     Weight 06/12/17 1220 70.8 kg (156 lb)     Height 06/12/17 1220 1.905 m (6\' 3" )     Head Circumference --      Peak Flow --      Pain Score 06/12/17 1220 0     Pain Loc --      Pain Edu? --      Excl. in [place]? --      Constitutional: Alert and oriented.  Eyes: Conjunctivae are normal.   Mouth/Throat: Mucous membranes are moist.   Neck:  Painless ROM Cardiovascular: Normal rate, regular rhythm. Grossly normal heart sounds.  Good peripheral circulation. Respiratory: Normal respiratory effort.  No retractions. Lungs CTAB. Gastrointestinal: Soft and nontender. No distention.  No CVA tenderness.  Musculoskeletal:  Warm and well perfused  Skin:  Skin is warm, dry and intact. No rash noted.   ____________________________________________   LABS (all labs ordered are listed, but only abnormal results are displayed)  Labs Reviewed  CBC - Abnormal; Notable for the following components:      Result Value   RBC 1.95 (*)    Hemoglobin 5.2 (*)    HCT 17.3 (*)    MCHC 30.2 (*)    RDW 18.2 (*)    All other components within normal limits  BASIC METABOLIC PANEL - Abnormal; Notable for the following components:   Potassium 6.9 (*)    Chloride 100 (*)    BUN 74 (*)    Creatinine, Ser 8.76 (*)    Calcium 7.8 (*)    GFR calc non Af Amer 6 (*)    GFR calc Af Amer 7 (*)    All other components within normal limits  TYPE AND SCREEN  PREPARE RBC (CROSSMATCH)   ____________________________________________  EKG  EKG pending ____________________________________________  RADIOLOGY  None ____________________________________________   PROCEDURES  Procedure(s) performed: No  Procedures   Critical Care performed: yes  CRITICAL CARE Performed by: Lavonia Drafts   Total critical care time:30 minutes  Critical care time was exclusive of separately billable procedures and treating other patients.  Critical care was necessary to treat or prevent imminent or life-threatening deterioration.  Critical care was time spent personally by me on the following activities: development of treatment plan with patient and/or surrogate as well as nursing, discussions with consultants, evaluation of  patient's response to treatment, examination of patient, obtaining history from patient or surrogate, ordering and performing treatments and interventions, ordering and review of laboratory studies, ordering and review of radiographic studies, pulse oximetry and re-evaluation of patient's condition.  ____________________________________________  INITIAL IMPRESSION / ASSESSMENT AND PLAN / ED COURSE  Pertinent labs & imaging results that were available during my care of the patient were reviewed by me and considered in my medical decision making (see chart for details).  Patient well-appearing in no acute distress.  Exam is overall reassuring.  Patient seems to have somewhat chronic GI bleeding and every time that it is attempted to be evaluated/worked up he has left AGAINST MEDICAL ADVICE.  We will check hemoglobin here although his hemoglobin was just checked 2 days ago and was stable.  Notified dialysis staff of patient's arrival and need for dialysis  Informed that patient's potassium was 6.9, will obtain EKG and place the patient on a cardiac monitor, he is due to go to dialysis shortly, will give albuterol neb and kayexylate   hemoglobin is 5.2  This is a significant decrease from prior, I will type and screen the patient and order blood and admit to the hospitalist service    ____________________________________________   FINAL CLINICAL IMPRESSION(S) / ED DIAGNOSES  Final diagnoses:  Encounter for dialysis [facility])  Gastrointestinal hemorrhage, unspecified gastrointestinal hemorrhage type        Note:  This document was prepared using Dragon voice recognition software and may include unintentional dictation errors.    Lavonia Drafts, MD 06/12/17 1430    Lavonia Drafts, MD 06/12/17 1516    Lavonia Drafts, MD 06/12/17 1517

## 2017-06-12 NOTE — ED Notes (Signed)
Hospitalist to bedside at this time 

## 2017-06-12 NOTE — ED Notes (Addendum)
Lab notified to collect Type & Screen

## 2017-06-12 NOTE — Progress Notes (Signed)
Pre Hd Assessment    06/12/17 1755  Neurological  Level of Consciousness Alert  Orientation Level Oriented X4  Respiratory  Respiratory Pattern Regular  Chest Assessment Chest expansion symmetrical  Bilateral Breath Sounds Clear;Diminished  Cardiac  Pulse Irregular  Heart Sounds S1, S2  Jugular Venous Distention (JVD) Yes  Vascular  R Radial Pulse +2  L Radial Pulse +2  Psychosocial  Psychosocial (WDL) WDL  Patient Behaviors Appropriate for situation

## 2017-06-12 NOTE — Progress Notes (Signed)
Post HD Assessment, treatment tolerated well, pt c/o feeling cold. Pt is hypotensive w/ GI bleed, afebrile. Asymptomatic. No change in AO/LOC from initial assessment.    06/12/17 2125  Neurological  Level of Consciousness Alert  Orientation Level Oriented X4  Respiratory  Respiratory Pattern Regular  Chest Assessment Chest expansion symmetrical  Bilateral Breath Sounds Clear;Diminished  Cardiac  Pulse Irregular  Heart Sounds S1, S2  Jugular Venous Distention (JVD) Yes  Vascular  R Radial Pulse +2  L Radial Pulse +2  Psychosocial  Psychosocial (WDL) WDL  Patient Behaviors Appropriate for situation

## 2017-06-12 NOTE — Progress Notes (Signed)
HD Tx ended    06/12/17 2115  Vital Signs  Pulse Rate 91  Resp (!) 24  BP (!) 98/53  Oxygen Therapy  SpO2 100 %  During Hemodialysis Assessment  HD Safety Checks Performed Yes  Dialysis Fluid Bolus Normal Saline  Bolus Amount (mL) 250 mL  Intra-Hemodialysis Comments Tx completed;Tolerated well

## 2017-06-12 NOTE — Progress Notes (Signed)
Post HD Tx . UF goal not met, hypotension.    06/12/17 2120  Vital Signs  Temp 98 F (36.7 C)  Temp Source Oral  Pulse Rate 92  Pulse Rate Source Monitor  Resp (!) 22  BP (!) 89/43  BP Location Left Arm  BP Method Automatic  Patient Position (if appropriate) Lying  Oxygen Therapy  SpO2 100 %  O2 Device Room Air  Post-Hemodialysis Assessment  Rinseback Volume (mL) 250 mL  Dialyzer Clearance Lightly streaked  Duration of HD Treatment -hour(s) 3 hour(s)  Hemodialysis Intake (mL) 500 mL  UF Total -Machine (mL) 890 mL  Net UF (mL) 390 mL  Tolerated HD Treatment Yes  Education / Care Plan  Dialysis Education Provided Yes  Documented Education in Care Plan Yes

## 2017-06-12 NOTE — Progress Notes (Signed)
[facility]  ROUNDING NOTE   Subjective:   [name] being admitted for anemia and hyperkalemia. He endorses melanotic stools.   Objective:  Vital signs in last 24 hours:  Temp:  [97.8 F (36.6 C)-98 F (36.7 C)] 98 F (36.7 C) (04/01 1830) Pulse Rate:  [62-86] 85 (04/01 1930) Resp:  [12-24] 12 (04/01 1930) BP: (89-112)/(50-64) 89/57 (04/01 1930) SpO2:  [94 %-100 %] 100 % (04/01 1930) Weight:  [67.7 kg (149 lb 3.2 oz)-70.8 kg (156 lb)] 67.7 kg (149 lb 4 oz) (04/01 1815)  Weight change:  Filed Weights   06/12/17 1709 06/12/17 1800 06/12/17 1815  Weight: 67.7 kg (149 lb 3.2 oz) 67.7 kg (149 lb 4 oz) 67.7 kg (149 lb 4 oz)    Intake/Output: No intake/output data recorded.   Intake/Output this shift:  No intake/output data recorded.  Physical Exam: General: NAD  Head: Normocephalic, atraumatic. Moist oral mucosal membranes  Eyes: Anicteric  Neck: Supple  Lungs:  Clear to auscultation, normal effort  Heart: Regular rate and rhythm  Abdomen:  Soft, nontender, distended  Extremities: no peripheral edema.  Neurologic: Nonfocal, moving all four extremities  Skin: No lesions  Access: RUE AVF - no bruit or thrill, RIJ permcath    Basic Metabolic Panel: Recent Labs  Lab 06/09/17 1434 06/12/17 1427  NA 139 136  K 3.9 6.9*  CL 98* 100*  CO2 29 22  GLUCOSE 117* 77  BUN 34* 74*  CREATININE 6.30* 8.76*  CALCIUM 7.9* 7.8*  PHOS 3.0  2.1*  --     Liver Function Tests: Recent Labs  Lab 06/09/17 1434  ALBUMIN 2.3*   No results for input(s): LIPASE, AMYLASE in the last 168 hours. No results for input(s): AMMONIA in the last 168 hours.  CBC: Recent Labs  Lab 06/06/17 0534 06/06/17 1040 06/06/17 1748 06/09/17 1434 06/12/17 1427 06/12/17 1726  WBC 11.0*  --   --  7.3 10.5  --   HGB 7.9* 7.6* 8.9* 7.9* 5.2* 5.0*  HCT 25.4*  --   --  25.6* 17.3* 15.4*  MCV 86.4  --   --  86.6 88.6  --   PLT 261  --   --  367 374  --     Cardiac  Enzymes: No results for input(s): CKTOTAL, CKMB, CKMBINDEX, TROPONINI in the last 168 hours.  BNP: Invalid input(s): POCBNP  CBG: Recent Labs  Lab 06/06/17 0002 06/06/17 0552  GLUCAP 89 59    Microbiology: Results for orders placed or performed during the hospital encounter of 06/02/17  MRSA PCR Screening     Status: None   Collection Time: 06/02/17  4:22 PM  Result Value Ref Range Status   MRSA by PCR NEGATIVE NEGATIVE Final    Comment:        The GeneXpert MRSA Assay (FDA approved for NASAL specimens only), is one component of a comprehensive MRSA colonization surveillance program. It is not intended to diagnose MRSA infection nor to guide or monitor treatment for MRSA infections. Performed at [facility], [address] [place], [place] [address]   Body fluid culture     Status: None   Collection Time: 06/05/17 10:28 AM  Result Value Ref Range Status   Specimen Description   Final    PERITONEAL Performed at [facility], [address], [place], [place] [address]    Special Requests   Final    NONE Performed at [facility], [address] [place], [place] [address]    Gram Stain   Final    WBC PRESENT,BOTH PMN AND MONONUCLEAR NO ORGANISMS SEEN CYTOSPIN SMEAR    Culture   Final    NO GROWTH 3 DAYS Performed at [facility] [address], [place], [place] [address]    Report Status 06/08/2017 FINAL  Final    Coagulation Studies: No results for input(s): LABPROT, INR in the last 72 hours.  Urinalysis: No results for input(s): COLORURINE, LABSPEC, PHURINE, GLUCOSEU, HGBUR, BILIRUBINUR, KETONESUR, PROTEINUR, UROBILINOGEN, NITRITE, LEUKOCYTESUR in the last 72 hours.  Invalid input(s): APPERANCEUR    Imaging: No results found.   Medications:   . ipratropium-albuterol  3 mL Nebulization Q6H  . nicotine  7 mg Transdermal Daily  . pantoprazole (PROTONIX) IV  40 mg Intravenous Q12H  . tiotropium  18 mcg  Inhalation Daily      Assessment/ Plan:  [name] is a 57 y.o. black male withend stage renal disease on hemodialysis secondary to Alport's syndrome, ascites, hypertension, anemia of chronic kidney disease, coronary artery disease, peripheral vascular disease, hyperlipidemia, gastrointestinal AVMs, pulmonary hypertension,   nooutpatientdialysis unit currently  1.  Hyperkalemia 2.  Anemia of chronic kidney disease and GI Bleed 3.  Secondary Hyperparathyroidism    4.  Ascites 5.  End-stage renal disease  Plan:  Patient to receive hemodialysis today. 2 K bath.  PRBC transfusion.  GI consulted.    LOS: 0 Stanley [name] 4/1/20197:51 PM

## 2017-06-12 NOTE — Progress Notes (Signed)
Family Meeting Note  Advance Directive:yes  Today a meeting took place with the Patient.    The following clinical team members were present during this meeting:MD  The following were discussed:Patient's diagnosis: GI bleed, end-stage renal disease on hemodialysis on Monday Wednesday, Friday, hyperkalemia, history of gastritis, tobacco abuse, management and plan of care was discussed in detail with the patient.  He verbalized understanding of the plan., Patient's progosis: Unable to determine and Goals for treatment: Full Code, sister Reginold Agent is HCPOA  Additional follow-up to be provided: Hospitalist, gastroenterology, nephrology  Time spent during discussion:18 MIN  Nicholes Mango, MD

## 2017-06-12 NOTE — ED Triage Notes (Signed)
Says he is here for his dialysis as welll as rectal bleeding. Says he has bleeding worse after they do surgery (dialysis catheter)--says had surgery 2 weeks ago.

## 2017-06-13 ENCOUNTER — Inpatient Hospital Stay: Payer: Medicare Other

## 2017-06-13 ENCOUNTER — Encounter: Payer: Self-pay | Admitting: Radiology

## 2017-06-13 DIAGNOSIS — K922 Gastrointestinal hemorrhage, unspecified: Secondary | ICD-10-CM

## 2017-06-13 HISTORY — DX: Gastrointestinal hemorrhage, unspecified: K92.2

## 2017-06-13 LAB — HEMOGLOBIN AND HEMATOCRIT, BLOOD
HEMATOCRIT: 15.9 % — AB (ref 40.0–52.0)
Hemoglobin: 5.1 g/dL — ABNORMAL LOW (ref 13.0–18.0)

## 2017-06-13 LAB — PREALBUMIN: Prealbumin: 15.3 mg/dL — ABNORMAL LOW (ref 18–38)

## 2017-06-13 MED ORDER — TECHNETIUM TC 99M-LABELED RED BLOOD CELLS IV KIT
20.0000 | PACK | Freq: Once | INTRAVENOUS | Status: AC | PRN
  Administered 2017-06-13: 21.534 via INTRAVENOUS

## 2017-06-13 MED ORDER — GABAPENTIN 300 MG PO CAPS
300.0000 mg | ORAL_CAPSULE | Freq: Three times a day (TID) | ORAL | Status: DC | PRN
  Administered 2017-06-13: 300 mg via ORAL
  Filled 2017-06-13: qty 1

## 2017-06-13 MED ORDER — BUDESONIDE 0.5 MG/2ML IN SUSP
0.5000 mg | Freq: Two times a day (BID) | RESPIRATORY_TRACT | Status: DC
  Administered 2017-06-13 – 2017-06-16 (×6): 0.5 mg via RESPIRATORY_TRACT
  Filled 2017-06-13 (×6): qty 2

## 2017-06-13 MED ORDER — VITAMIN C 500 MG PO TABS
500.0000 mg | ORAL_TABLET | Freq: Two times a day (BID) | ORAL | Status: DC
  Administered 2017-06-13 – 2017-06-16 (×6): 500 mg via ORAL
  Filled 2017-06-13 (×8): qty 1

## 2017-06-13 MED ORDER — HYDROMORPHONE HCL 1 MG/ML IJ SOLN
0.5000 mg | Freq: Once | INTRAMUSCULAR | Status: AC | PRN
  Administered 2017-06-13: 0.5 mg via INTRAVENOUS
  Filled 2017-06-13: qty 1

## 2017-06-13 MED ORDER — SODIUM CHLORIDE 0.9 % IV SOLN
50.0000 ug/h | INTRAVENOUS | Status: DC
  Administered 2017-06-13 – 2017-06-14 (×3): 50 ug/h via INTRAVENOUS
  Filled 2017-06-13 (×11): qty 1

## 2017-06-13 MED ORDER — FUROSEMIDE 10 MG/ML IJ SOLN
80.0000 mg | Freq: Two times a day (BID) | INTRAMUSCULAR | Status: DC
  Administered 2017-06-13 – 2017-06-14 (×3): 80 mg via INTRAVENOUS
  Filled 2017-06-13 (×4): qty 8

## 2017-06-13 MED ORDER — SODIUM CHLORIDE 0.9 % IV SOLN
8.0000 mg/h | INTRAVENOUS | Status: DC
  Administered 2017-06-13 – 2017-06-15 (×4): 8 mg/h via INTRAVENOUS
  Filled 2017-06-13 (×6): qty 80

## 2017-06-13 MED ORDER — GABAPENTIN 300 MG PO CAPS: 300.0000 mg | ORAL_CAPSULE | Freq: Every evening | ORAL | Status: DC | PRN

## 2017-06-13 MED ORDER — PANTOPRAZOLE SODIUM 40 MG IV SOLR: 40.0000 mg | Freq: Two times a day (BID) | INTRAVENOUS | Status: DC

## 2017-06-13 MED ORDER — AMITRIPTYLINE HCL 25 MG PO TABS
50.0000 mg | ORAL_TABLET | Freq: Every day | ORAL | Status: DC
  Administered 2017-06-13 – 2017-06-15 (×3): 50 mg via ORAL
  Filled 2017-06-13 (×3): qty 2

## 2017-06-13 MED ORDER — BOOST / RESOURCE BREEZE PO LIQD CUSTOM
1.0000 | Freq: Three times a day (TID) | ORAL | Status: DC
  Administered 2017-06-13 (×2): 1 via ORAL
  Administered 2017-06-14: 237 mL via ORAL
  Administered 2017-06-14: 1 via ORAL

## 2017-06-13 MED ORDER — ACETAMINOPHEN 500 MG PO TABS
1000.0000 mg | ORAL_TABLET | Freq: Once | ORAL | Status: DC
  Filled 2017-06-13: qty 2

## 2017-06-13 MED ORDER — ACETAMINOPHEN 325 MG PO TABS: 650.0000 mg | ORAL_TABLET | Freq: Three times a day (TID) | ORAL | Status: DC

## 2017-06-13 MED ORDER — ADULT MULTIVITAMIN W/MINERALS CH
1.0000 | ORAL_TABLET | ORAL | Status: DC
  Administered 2017-06-14: 1 via ORAL
  Filled 2017-06-13 (×2): qty 1

## 2017-06-13 MED ORDER — ACETAMINOPHEN 325 MG PO TABS
650.0000 mg | ORAL_TABLET | Freq: Three times a day (TID) | ORAL | Status: DC | PRN
  Administered 2017-06-14 – 2017-06-15 (×3): 650 mg via ORAL
  Filled 2017-06-13 (×3): qty 2

## 2017-06-13 MED ORDER — SODIUM CHLORIDE 0.9 % IV SOLN: Freq: Once | INTRAVENOUS | Status: DC

## 2017-06-13 MED ORDER — RENA-VITE PO TABS
1.0000 | ORAL_TABLET | Freq: Every day | ORAL | Status: DC
  Administered 2017-06-13 – 2017-06-15 (×3): 1 via ORAL
  Filled 2017-06-13 (×4): qty 1

## 2017-06-13 MED ORDER — SODIUM CHLORIDE 0.9 % IV SOLN
80.0000 mg | Freq: Once | INTRAVENOUS | Status: AC
  Administered 2017-06-13: 10:00:00 80 mg via INTRAVENOUS
  Filled 2017-06-13: qty 80

## 2017-06-13 NOTE — Progress Notes (Signed)
[facility] [place] NAME: Stanley [name]    MR#:  [id number]  DATE OF BIRTH:  15-Jan-1961  SUBJECTIVE:  CHIEF COMPLAINT:   Chief Complaint  Patient presents with  . regular dialysis  . Rectal Bleeding  Patient continues to complain of dark blood per rectum, denies pain, malnourished appearance, noted 5.1 transfuse 2 units packed red blood cells, start Lasix, changed, and octreotide drip, dietary to see, gastroenterology to see  REVIEW OF SYSTEMS:  CONSTITUTIONAL: No fever, fatigue or weakness.  EYES: No blurred or double vision.  EARS, NOSE, AND THROAT: No tinnitus or ear pain.  RESPIRATORY: No cough, shortness of breath, wheezing or hemoptysis.  CARDIOVASCULAR: No chest pain, orthopnea, edema.  GASTROINTESTINAL: No nausea, vomiting, diarrhea or abdominal pain.  GENITOURINARY: No dysuria, hematuria.  ENDOCRINE: No polyuria, nocturia,  HEMATOLOGY: No anemia, easy bruising or bleeding SKIN: No rash or lesion. MUSCULOSKELETAL: No joint pain or arthritis.   NEUROLOGIC: No tingling, numbness, weakness.  PSYCHIATRY: No anxiety or depression.   ROS  DRUG ALLERGIES:  No Known Allergies  VITALS:  Blood pressure (!) 106/54, pulse 83, temperature 98.6 F (37 C), temperature source Oral, resp. rate 14, height 6\' 3"  (1.905 m), weight 65.7 kg (144 lb 14.4 oz), SpO2 95 %.  PHYSICAL EXAMINATION:  GENERAL:  57 y.o.-year-old patient lying in the bed with no acute distress.  EYES: Pupils equal, round, reactive to light and accommodation. No scleral icterus. Extraocular muscles intact.  HEENT: Head atraumatic, normocephalic. Oropharynx and nasopharynx clear.  NECK:  Supple, no jugular venous distention. No thyroid enlargement, no tenderness.  LUNGS: Normal breath sounds bilaterally, no wheezing, rales,rhonchi or crepitation. No use of accessory muscles of respiration.  CARDIOVASCULAR: S1, S2 normal. No murmurs, rubs, or gallops.  ABDOMEN: Soft,  nontender, nondistended. Bowel sounds present. No organomegaly or mass.  EXTREMITIES: No pedal edema, cyanosis, or clubbing.  NEUROLOGIC: Cranial nerves II through XII are intact. Muscle strength 5/5 in all extremities. Sensation intact. Gait not checked.  PSYCHIATRIC: The patient is alert and oriented x 3.  SKIN: No obvious rash, lesion, or ulcer.   Physical Exam LABORATORY PANEL:   CBC Recent Labs  Lab 06/12/17 1427  06/13/17 0339  WBC 10.5  --   --   HGB 5.2*   < > 5.1*  HCT 17.3*   < > 15.9*  PLT 374  --   --    < > = values in this interval not displayed.   ------------------------------------------------------------------------------------------------------------------  Chemistries  Recent Labs  Lab 06/12/17 1427  NA 136  K 6.9*  CL 100*  CO2 22  GLUCOSE 77  BUN 74*  CREATININE 8.76*  CALCIUM 7.8*   ------------------------------------------------------------------------------------------------------------------  Cardiac Enzymes No results for input(s): TROPONINI in the last 168 hours. ------------------------------------------------------------------------------------------------------------------  RADIOLOGY:  No results found.  ASSESSMENT AND PLAN:  1 acute melena GI bleed  Noted history of cirrhosis, peptic ulcer disease, history of intestinal AVMs Continued dark colored bleeding per rectum with symptomatic anemia Hematemesis and melena on admission Gastroenterology to see, change to Protonix drip, start, transfuse 2 units packed red blood cell,, IV Lasix, and transfuse as needed  Recent endoscopy shown gastritis  2 acute symptomatic anemia Stable Plan of care per above  3 acute hyperkalemia Resolved with Kayexalate and HD  4 chronic ESRD HD for Monday Wednesday and Friday Nephrology input appreciated  5 chronic tobacco abuse disorder Stable Nicotine patch and cessation counseling ordered  6 COPD without exacerbation  Noted chronic  cough Breathing treatments as needed, Pulmicort  7 acute on chronic malnutrition  Dietary consult, check prealbumin   All the records are reviewed and case discussed with Care Management/Social Workerr. Management plans discussed with the patient, family and they are in agreement.  CODE STATUS: full  TOTAL TIME TAKING CARE OF THIS PATIENT: 45 minutes.     POSSIBLE D/C IN 1-3 DAYS, DEPENDING ON CLINICAL CONDITION.   Avel Peace Jennelle Pinkstaff M.D on 06/13/2017   Between 7am to 6pm - Pager - [phone]  After 6pm go to [email or web address] - password [email or web address] [facility]  Office  [phone]  CC: Primary care physician; [name], MD  Note: This dictation was prepared with Dragon dictation along with smaller phrase technology. Any transcriptional errors that result from this process are unintentional.

## 2017-06-13 NOTE — Consult Note (Signed)
[name], MD [facility]  [address], [place], [place] [address] Phone: [phone] Fax : [phone]  Consultation  Referring Provider:     Dr. Juleen China Primary Care Physician:  [name], MD Primary Gastroenterologist:  Dr. Vira Agar         Reason for Consultation:     GI bleed  Date of Admission:  06/12/2017 Date of Consultation:  06/13/2017         HPI:   Stanley [name] is a 57 y.o. male who has been in the hospital multiple times and seen by multiple gastroneurologist inpatient in both outpatient.  The patient has been seen by Dr. Gustavo Lah, Dr. Vira Agar, Dr. Candace Cruise, Dr. Marius Ditch, Dr. Hilarie Fredrickson, Dr. Michail Sermon, Dr. Vicente Males, Dr. Alice Reichert, Dr. Bonna Gains and myself.  The patient has had an extensive workup in the past for recurrent GI bleeding and anemia.  His most recent procedures include multiple colonoscopies and EGDs with small bowel endoscopy and capsule endoscopy.  The patient was recommended to go to [facility] for small bowel enteroscopy on multiple occasions but has still not gone there for the procedure.  He now comes in with what he reports to be rectal bleeding.  The patient was found to have a hemoglobin of 5.2 that went down to 4.3 and was 5.1 this morning.  Past Medical History:  Diagnosis Date  . Alcohol abuse   . CHF (congestive heart failure) ([place])   . Cirrhosis ([place])   . Coronary artery disease 2009  . Diabetic peripheral neuropathy ([place])   . Drug abuse ([place])   . End stage renal disease on dialysis [facility]) NEPHROLOGIST-   DR [facility]  IN [place]   HEMODIALYSIS --   TUES/  THURS/  SAT  . GERD (gastroesophageal reflux disease)   . Hyperlipidemia   . Hypertension   . PAD (peripheral artery disease) ([place])   . Renal insufficiency    Per pt, 32 oz fluid restriction per day  . S/P triple vessel bypass 06/09/2016   2009ish  . Suicidal ideation    & HOMICIDAL IDEATION --  06-16-2013   ADMITTED TO BEHAVIOR HEALTH    Past Surgical History:  Procedure Laterality Date  . A/V  FISTULAGRAM Right 06/06/2017   Procedure: A/V FISTULAGRAM;  Surgeon: Katha Cabal, MD;  Location: [place] CV LAB;  Service: Cardiovascular;  Laterality: Right;  . A/V SHUNT INTERVENTION N/A 06/06/2017   Procedure: A/V SHUNT INTERVENTION;  Surgeon: Katha Cabal, MD;  Location: [place] CV LAB;  Service: Cardiovascular;  Laterality: N/A;  . AGILE CAPSULE N/A 06/19/2016   Procedure: AGILE CAPSULE;  Surgeon: [name], MD;  Location: [facility] ENDOSCOPY;  Service: Endoscopy;  Laterality: N/A;  . COLONOSCOPY WITH PROPOFOL N/A 06/18/2016   Procedure: COLONOSCOPY WITH PROPOFOL;  Surgeon: [name], MD;  Location: [facility] ENDOSCOPY;  Service: Endoscopy;  Laterality: N/A;  . COLONOSCOPY WITH PROPOFOL N/A 08/12/2016   Procedure: COLONOSCOPY WITH PROPOFOL;  Surgeon: [name], MD;  Location[facility] ENDOSCOPY;  Service: Endoscopy;  Laterality: N/A;  . COLONOSCOPY WITH PROPOFOL N/A 05/05/2017   Procedure: COLONOSCOPY WITH PROPOFOL;  Surgeon: Manya Silvas, MD;  Location: [facility] ENDOSCOPY;  Service: Endoscopy;  Laterality: N/A;  . CORONARY ANGIOPLASTY  ?   PT UNABLE TO TELL IF  BEFORE OR AFTER  CABG  . CORONARY ARTERY BYPASS GRAFT  2008  ([place] , [place])   3 VESSEL  . DIALYSIS FISTULA CREATION  LAST SURGERY  APPOX  2008  . ENTEROSCOPY N/A 05/10/2016  Procedure: ENTEROSCOPY;  Surgeon: Jerene Bears, MD;  Location: [place];  Service: Gastroenterology;  Laterality: N/A;  . ENTEROSCOPY N/A 08/12/2016   Procedure: ENTEROSCOPY;  Surgeon: [name], MD;  Location: [facility] ENDOSCOPY;  Service: Endoscopy;  Laterality: N/A;  . ENTEROSCOPY Left 06/03/2017   Procedure: ENTEROSCOPY;  Surgeon: Virgel Manifold, MD;  Location: [facility] ENDOSCOPY;  Service: Endoscopy;  Laterality: Left;  Procedure date will ultimately depend on when patient is medically optimized before the procedure, pending hemodialysis and blood transfusions etc. Will place on schedule and change depending on clinical status.   . ENTEROSCOPY  N/A 06/05/2017   Procedure: ENTEROSCOPY;  Surgeon: Virgel Manifold, MD;  Location: [facility] ENDOSCOPY;  Service: Endoscopy;  Laterality: N/A;  . ESOPHAGOGASTRODUODENOSCOPY N/A 05/07/2015   Procedure: ESOPHAGOGASTRODUODENOSCOPY (EGD);  Surgeon: Hulen Luster, MD;  Location: [facility] ENDOSCOPY;  Service: Endoscopy;  Laterality: N/A;  . ESOPHAGOGASTRODUODENOSCOPY (EGD) WITH PROPOFOL N/A 05/17/2015   Procedure: ESOPHAGOGASTRODUODENOSCOPY (EGD) WITH PROPOFOL;  Surgeon: [name], MD;  Location: [facility] ENDOSCOPY;  Service: Endoscopy;  Laterality: N/A;  . ESOPHAGOGASTRODUODENOSCOPY (EGD) WITH PROPOFOL N/A 01/20/2016   Procedure: ESOPHAGOGASTRODUODENOSCOPY (EGD) WITH PROPOFOL;  Surgeon: [name], MD;  Location: [facility] ENDOSCOPY;  Service: Endoscopy;  Laterality: N/A;  . ESOPHAGOGASTRODUODENOSCOPY (EGD) WITH PROPOFOL N/A 04/17/2016   Procedure: ESOPHAGOGASTRODUODENOSCOPY (EGD) WITH PROPOFOL;  Surgeon: Lin Landsman, MD;  Location: [facility] ENDOSCOPY;  Service: Gastroenterology;  Laterality: N/A;  . ESOPHAGOGASTRODUODENOSCOPY (EGD) WITH PROPOFOL  05/09/2016   Procedure: ESOPHAGOGASTRODUODENOSCOPY (EGD) WITH PROPOFOL;  Surgeon: Jerene Bears, MD;  Location: [place];  Service: Endoscopy;;  . ESOPHAGOGASTRODUODENOSCOPY (EGD) WITH PROPOFOL N/A 06/16/2016   Procedure: ESOPHAGOGASTRODUODENOSCOPY (EGD) WITH PROPOFOL;  Surgeon: [name], MD;  Location: [facility] ENDOSCOPY;  Service: Endoscopy;  Laterality: N/A;  . ESOPHAGOGASTRODUODENOSCOPY (EGD) WITH PROPOFOL N/A 05/05/2017   Procedure: ESOPHAGOGASTRODUODENOSCOPY (EGD) WITH PROPOFOL;  Surgeon: Manya Silvas, MD;  Location[facility] ENDOSCOPY;  Service: Endoscopy;  Laterality: N/A;  . GIVENS CAPSULE STUDY N/A 05/07/2016   Procedure: GIVENS CAPSULE STUDY;  Surgeon: Doran Stabler, MD;  Location: [place];  Service: Endoscopy;  Laterality: N/A;  . MANDIBULAR HARDWARE REMOVAL N/A 07/29/2013   Procedure: REMOVAL OF ARCH BARS;  Surgeon: Theodoro Kos, DO;  Location: [place];  Service: Plastics;  Laterality: N/A;  . ORIF MANDIBULAR FRACTURE N/A 06/05/2013   Procedure: REPAIR OF MANDIBULAR FRACTURE x 2 with maxillo-mandibular fixation ;  Surgeon: Theodoro Kos, DO;  Location: [place];  Service: Plastics;  Laterality: N/A;  . PERIPHERAL ARTERIAL STENT GRAFT Left     Prior to Admission medications   Medication Sig Start Date End Date Taking? Authorizing Provider  albuterol (PROVENTIL HFA;VENTOLIN HFA) 108 (90 Base) MCG/ACT inhaler Inhale 2 puffs into the lungs every 6 (six) hours as needed for wheezing or shortness of breath. 04/21/17   Lisa Roca, MD  alum & mag hydroxide-simeth (MAALOX/MYLANTA) 200-200-20 MG/5ML suspension Take 15 mLs every 4 (four) hours as needed by mouth for indigestion or heartburn. Patient not taking: Reported on 04/19/2017 01/25/17   Demetrios Loll, MD  atorvastatin (LIPITOR) 40 MG tablet Take 1 tablet (40 mg total) by mouth daily. 11/16/16   Vaughan Basta, MD  budesonide-formoterol (SYMBICORT) 160-4.5 MCG/ACT inhaler Inhale 2 puffs into the lungs daily. 11/16/16   Vaughan Basta, MD  calcium acetate (PHOSLO) 667 MG capsule Take 2,001 mg by mouth 3 (three) times daily.    [provider]  ferrous sulfate 325 (65 FE) MG tablet Take 1 tablet by mouth 3 (  three) times daily. 01/21/17   [provider]  furosemide (LASIX) 80 MG tablet Take 1 tablet (80 mg total) daily by mouth. 01/25/17   Demetrios Loll, MD  gabapentin (NEURONTIN) 300 MG capsule Take 1 capsule by mouth daily as directed 08/16/16   [provider]  ipratropium-albuterol (DUONEB) 0.5-2.5 (3) MG/3ML SOLN Take 3 mLs by nebulization every 6 (six) hours as needed. 11/28/16   Loletha Grayer, MD  labetalol (NORMODYNE) 100 MG tablet Take 100 mg by mouth daily. 03/24/17   [provider]  Multiple Vitamins-Minerals-FA (DIALYVITE SUPREME D) 3 MG TABS Take 1 tablet (3 mg total) by mouth daily. 11/28/16   Loletha Grayer, MD  nitroGLYCERIN  (NITROSTAT) 0.4 MG SL tablet Place 1 tablet (0.4 mg total) under the tongue every 5 (five) minutes as needed. 04/12/16   Wende Bushy, MD  pantoprazole (PROTONIX) 40 MG tablet Take 1 tablet (40 mg total) by mouth daily. 11/28/16   Loletha Grayer, MD  Spacer/Aero Chamber Mouthpiece MISC 1 Units by Does not apply route every 4 (four) hours as needed (wheezing). 02/19/17   Darel Hong, MD  tiotropium (SPIRIVA HANDIHALER) 18 MCG inhalation capsule Place 1 capsule (18 mcg total) into inhaler and inhale daily. 11/16/16 11/16/17  Vaughan Basta, MD    Family History  Problem Relation Age of Onset  . Colon cancer Mother   . Cancer Father   . Cancer Sister   . Kidney disease Brother      Social History   Tobacco Use  . Smoking status: Current Every Day Smoker    Packs/day: 0.15    Years: 40.00    Pack years: 6.00    Types: Cigarettes  . Smokeless tobacco: Never Used  Substance Use Topics  . Alcohol use: No    Comment: pt reports quitting after learning about cirrhosis  . Drug use: No    Frequency: 7.0 times per week    Types: Marijuana, Cocaine    Allergies as of 06/12/2017  . (No Known Allergies)    Review of Systems:    All systems reviewed and negative except where noted in HPI.   Physical Exam:  Vital signs in last 24 hours: Temp:  [97.8 F (36.6 C)-99.4 F (37.4 C)] 98.5 F (36.9 C) (04/02 1551) Pulse Rate:  [77-93] 93 (04/02 1551) Resp:  [12-24] 14 (04/02 1551) BP: (83-112)/(35-65) 89/41 (04/02 1551) SpO2:  [91 %-100 %] 94 % (04/02 1551) Weight:  [144 lb 14.4 oz (65.7 kg)-149 lb 4 oz (67.7 kg)] 144 lb 14.4 oz (65.7 kg) (04/02 0406) Last BM Date: 06/12/17 General:   Pleasant, cooperative in NAD Head:  Normocephalic and atraumatic. Eyes:   No icterus.   Conjunctiva pink. PERRLA. Ears:  Normal auditory acuity. Neck:  Supple; no masses or thyroidomegaly Lungs: Respirations even and unlabored. Lungs clear to auscultation bilaterally.   No wheezes, crackles, or  rhonchi.  Heart:  Regular rate and rhythm;  Without murmur, clicks, rubs or gallops Abdomen:  Soft, nondistended, nontender. Normal bowel sounds. No appreciable masses or hepatomegaly.  No rebound or guarding.  Rectal:  Not performed. Msk:  Symmetrical without gross deformities.    Extremities:  Without edema, cyanosis or clubbing. Neurologic:  Alert and oriented x3;  grossly normal neurologically. Skin:  Intact without significant lesions or rashes. Cervical Nodes:  No significant cervical adenopathy. Psych:  Alert and cooperative. Normal affect.  LAB RESULTS: Recent Labs    06/12/17 1427 06/12/17 1726 06/12/17 2201 06/13/17 0339  WBC 10.5  --   --   --  HGB 5.2* 5.0* 4.3* 5.1*  HCT 17.3* 15.4* 13.7* 15.9*  PLT 374  --   --   --    BMET Recent Labs    06/12/17 1427  NA 136  K 6.9*  CL 100*  CO2 22  GLUCOSE 77  BUN 74*  CREATININE 8.76*  CALCIUM 7.8*   LFT No results for input(s): PROT, ALBUMIN, AST, ALT, ALKPHOS, BILITOT, BILIDIR, IBILI in the last 72 hours. PT/INR No results for input(s): LABPROT, INR in the last 72 hours.  STUDIES: No results found.    Impression / Plan:   Stanley [name] is a 57 y.o. y/o male with a history of recurrent GI bleeding without any active bleeding or source found after multiple EGDs colonoscopies and capsule endoscopies.  The patient now comes in with bright red blood per rectum.  The patient states that he was found to have polyps on his colonoscopy in March by Dr. Vira Agar.  Please see multiple consult notes in the chart from past admissions.  The patient states he is having bright red blood per rectum now and will be sent down for stat bleeding scan.  I do not think that the patient will benefit from repeating the EGD and colonoscopy unless a source is found during the bleeding scan.  Thank you for involving me in the care of this patient.      LOS: 1 day   [name], MD  06/13/2017, 4:23 PM   Note: This dictation was  prepared with Dragon dictation along with smaller phrase technology. Any transcriptional errors that result from this process are unintentional.

## 2017-06-13 NOTE — Progress Notes (Signed)
Initial Nutrition Assessment  DOCUMENTATION CODES:   Severe malnutrition in context of chronic illness  INTERVENTION:   Boost Breeze po TID, each supplement provides 250 kcal and 9 grams of protein  MVI 3 times weekly  Rena-vite daily   Vitamin C '500mg'$  BID   Recommend weekly vitamin D injections during dialysis   NUTRITION DIAGNOSIS:   Severe Malnutrition related to chronic illness(ESRD on HD, COPD, etoh abuse, cirrhosis ) as evidenced by severe fat depletion, severe muscle depletion.  GOAL:   Patient will meet greater than or equal to 90% of their needs  MONITOR:   PO intake, Supplement acceptance, Labs, Weight trends, Diet advancement, I & O's, Skin  REASON FOR ASSESSMENT:   Consult Assessment of nutrition requirement/status  ASSESSMENT:   57 y.o. black male with end stage renal disease on hemodialysis secondary to Alport's syndrome, ascites, hypertension, anemia of chronic kidney disease, coronary artery disease, peripheral vascular disease, hyperlipidemia, gastrointestinal AVMs, pulmonary hypertension   Met with pt in room today. RD familiar with this pt from multiple previous admits. Pt is a long term HD patient with diagnosed Scurvy back in 01/2017. Pt is non compliant with taking his vitamin C at home and keeps returning with chronic GIBs. Pt is a poor eater at baseline. Pt eats bites of multiple, small, meals. Pt does like Nepro but he is unable to afford supplements at home. Per chart, it appears that pt has lost 7lbs since last admit; unsure if this is true weight loss or r/t fluid changes. Pt noted to have low vitamin D in November; unsure if patient is receiving weekly vitamin D injections now that he is having HD at [facility]. Pt currently on clear liquid diet; pt ate 50% of his breakfast this morning. RD will order supplements and vitamins.   Medications reviewed and include: lasix, nicotine, protonix, octreotide, hydromorphone   Labs reviewed: K 6.9(H), Cl 100(L),  BUN 74(H), creat 8.76(H), Ca 7.8(L)- 4/1 P 3.0 wnl- 3/29 Hgb 5.1(L), Hct 15.9(L) iPTH- 144(H)- 02/2017  NUTRITION - FOCUSED PHYSICAL EXAM:    Most Recent Value  Orbital Region  Moderate depletion  Upper Arm Region  Severe depletion  Thoracic and Lumbar Region  Moderate depletion  Buccal Region  Moderate depletion  Temple Region  Severe depletion  Clavicle Bone Region  Severe depletion  Clavicle and Acromion Bone Region  Severe depletion  Scapular Bone Region  Moderate depletion  Dorsal Hand  Moderate depletion  Patellar Region  Severe depletion  Anterior Thigh Region  Severe depletion  Posterior Calf Region  Severe depletion  Edema (RD Assessment)  None  Hair  Reviewed  Eyes  Reviewed  Mouth  Reviewed  Skin  Reviewed  Nails  Reviewed     Diet Order:  Diet clear liquid Room service appropriate? Yes; Fluid consistency: Thin  EDUCATION NEEDS:   Education needs have been addressed  Skin:  Reviewed RN Assessment  Last BM:  4/1- type 7  Height:   Ht Readings from Last 1 Encounters:  06/12/17 '6\' 3"'$  (1.905 m)    Weight:   Wt Readings from Last 1 Encounters:  06/13/17 144 lb 14.4 oz (65.7 kg)    Ideal Body Weight:  89 kg  BMI:  Body mass index is 18.11 kg/m.  Estimated Nutritional Needs:   Kcal:  2100-2400kcal/day   Protein:  105-120g/day   Fluid:  >2L/day or per MD  Koleen Distance MS, RD, LDN Pager #[phone] After Hours Pager: [phone]

## 2017-06-13 NOTE — Progress Notes (Signed)
[facility]  ROUNDING NOTE   Subjective:   Hemodialysis treatment yesterday. Tolerated treatment yesterday.   PRBC transfusion for today.   Objective:  Vital signs in last 24 hours:  Temp:  [97.8 F (36.6 C)-99.4 F (37.4 C)] 98.6 F (37 C) (04/02 1017) Pulse Rate:  [77-92] 83 (04/02 1017) Resp:  [12-24] 18 (04/02 1017) BP: (83-112)/(35-64) 98/57 (04/02 1017) SpO2:  [91 %-100 %] 99 % (04/02 1017) Weight:  [65.7 kg (144 lb 14.4 oz)-67.7 kg (149 lb 4 oz)] 65.7 kg (144 lb 14.4 oz) (04/02 0406)  Weight change:  Filed Weights   06/12/17 1800 06/12/17 1815 06/13/17 0406  Weight: 67.7 kg (149 lb 4 oz) 67.7 kg (149 lb 4 oz) 65.7 kg (144 lb 14.4 oz)    Intake/Output: I/O last 3 completed shifts: In: -  Out: 390 [Other:390]   Intake/Output this shift:  Total I/O In: 460 [P.O.:120; Blood:340] Out: -   Physical Exam: General: NAD  Head: Normocephalic, atraumatic. Moist oral mucosal membranes  Eyes: Anicteric  Neck: Supple  Lungs:  Clear to auscultation, normal effort  Heart: Regular rate and rhythm  Abdomen:  Soft, nontender, distended  Extremities: no peripheral edema.  Neurologic: Nonfocal, moving all four extremities  Skin: No lesions  Access: RUE AVF - no bruit or thrill, RIJ permcath    Basic Metabolic Panel: Recent Labs  Lab 06/09/17 1434 06/12/17 1427  NA 139 136  K 3.9 6.9*  CL 98* 100*  CO2 29 22  GLUCOSE 117* 77  BUN 34* 74*  CREATININE 6.30* 8.76*  CALCIUM 7.9* 7.8*  PHOS 3.0  2.1*  --     Liver Function Tests: Recent Labs  Lab 06/09/17 1434  ALBUMIN 2.3*   No results for input(s): LIPASE, AMYLASE in the last 168 hours. No results for input(s): AMMONIA in the last 168 hours.  CBC: Recent Labs  Lab 06/09/17 1434 06/12/17 1427 06/12/17 1726 06/12/17 2201 06/13/17 0339  WBC 7.3 10.5  --   --   --   HGB 7.9* 5.2* 5.0* 4.3* 5.1*  HCT 25.6* 17.3* 15.4* 13.7* 15.9*  MCV 86.6 88.6  --   --   --   PLT 367 374  --   --   --      Cardiac Enzymes: No results for input(s): CKTOTAL, CKMB, CKMBINDEX, TROPONINI in the last 168 hours.  BNP: Invalid input(s): POCBNP  CBG: No results for input(s): GLUCAP in the last 168 hours.  Microbiology: Results for orders placed or performed during the hospital encounter of 06/02/17  MRSA PCR Screening     Status: None   Collection Time: 06/02/17  4:22 PM  Result Value Ref Range Status   MRSA by PCR NEGATIVE NEGATIVE Final    Comment:        The GeneXpert MRSA Assay (FDA approved for NASAL specimens only), is one component of a comprehensive MRSA colonization surveillance program. It is not intended to diagnose MRSA infection nor to guide or monitor treatment for MRSA infections. Performed at [facility], [address], [place], [place] [address]   Body fluid culture     Status: None   Collection Time: 06/05/17 10:28 AM  Result Value Ref Range Status   Specimen Description   Final    PERITONEAL Performed at [facility], [address], [place], [place] [address]    Special Requests   Final    NONE Performed at [facility], [place], [place]  [address]    Gram Stain   Final    WBC PRESENT,BOTH PMN AND MONONUCLEAR NO ORGANISMS SEEN CYTOSPIN SMEAR    Culture   Final    NO GROWTH 3 DAYS Performed at [facility], [place] [address], [place], [place] [address]    Report Status 06/08/2017 FINAL  Final    Coagulation Studies: No results for input(s): LABPROT, INR in the last 72 hours.  Urinalysis: No results for input(s): COLORURINE, LABSPEC, PHURINE, GLUCOSEU, HGBUR, BILIRUBINUR, KETONESUR, PROTEINUR, UROBILINOGEN, NITRITE, LEUKOCYTESUR in the last 72 hours.  Invalid input(s): APPERANCEUR    Imaging: No results found.   Medications:   . acetaminophen  1,000 mg Oral Once  . acetaminophen  650 mg Oral TID  . amitriptyline  50 mg Oral QHS  . budesonide (PULMICORT) nebulizer solution  0.5 mg  Nebulization BID  . feeding supplement  1 Container Oral TID BM  . furosemide  80 mg Intravenous BID  . ipratropium-albuterol  3 mL Nebulization Q6H  . multivitamin  1 tablet Oral QHS  . [START ON 06/14/2017] multivitamin with minerals  1 tablet Oral Once per day on Mon Wed Fri  . nicotine  7 mg Transdermal Daily  . [START ON 06/16/2017] pantoprazole  40 mg Intravenous Q12H  . tiotropium  18 mcg Inhalation Daily  . vitamin C  500 mg Oral BID      Assessment/ Plan:  [name] is a 57 y.o. black male withend stage renal disease on hemodialysis secondary to Alport's syndrome, ascites, hypertension, anemia of chronic kidney disease, coronary artery disease, peripheral vascular disease, hyperlipidemia, gastrointestinal AVMs, pulmonary hypertension,   nooutpatientdialysis unit currently  1.  Hyperkalemia 2.  Anemia of chronic kidney disease and GI Bleed 3.  Secondary Hyperparathyroidism    4.  Ascites 5.  End-stage renal disease  Plan:  Continue MWF schedule.    LOS: 1 [name] 4/2/20191:57 PM

## 2017-06-13 NOTE — Care Management (Signed)
[name]  He has displayed behavior issues that is now preventing him from being serviced by local outpatient dialysis clinics. Presented to ED for his routine dialysis treatment and complaining of abdominal pain and rectal bleeding.  found to be anemic.  GI consult pending. Notified Elvera Bicker with [employer] of admission.  This is patient's 13th admission in 6 months

## 2017-06-14 DIAGNOSIS — E43 Unspecified severe protein-calorie malnutrition: Secondary | ICD-10-CM

## 2017-06-14 DIAGNOSIS — N186 End stage renal disease: Secondary | ICD-10-CM

## 2017-06-14 DIAGNOSIS — Z992 Dependence on renal dialysis: Secondary | ICD-10-CM

## 2017-06-14 DIAGNOSIS — Z515 Encounter for palliative care: Secondary | ICD-10-CM

## 2017-06-14 DIAGNOSIS — Z7189 Other specified counseling: Secondary | ICD-10-CM

## 2017-06-14 DIAGNOSIS — I10 Essential (primary) hypertension: Secondary | ICD-10-CM

## 2017-06-14 DIAGNOSIS — K922 Gastrointestinal hemorrhage, unspecified: Secondary | ICD-10-CM

## 2017-06-14 DIAGNOSIS — E119 Type 2 diabetes mellitus without complications: Secondary | ICD-10-CM

## 2017-06-14 LAB — HEMOGLOBIN AND HEMATOCRIT, BLOOD
HCT: 22.5 % — ABNORMAL LOW (ref 40.0–52.0)
HCT: 20.8 % — ABNORMAL LOW (ref 40.0–52.0)
HEMOGLOBIN: 7.2 g/dL — AB (ref 13.0–18.0)
Hemoglobin: 6.9 g/dL — ABNORMAL LOW (ref 13.0–18.0)

## 2017-06-14 LAB — CBC
HEMATOCRIT: 23.4 % — AB (ref 40.0–52.0)
HEMOGLOBIN: 7.5 g/dL — AB (ref 13.0–18.0)
MCH: 27.9 pg (ref 26.0–34.0)
MCHC: 32 g/dL (ref 32.0–36.0)
MCV: 87.4 fL (ref 80.0–100.0)
Platelets: 352 10*3/uL (ref 150–440)
RBC: 2.68 MIL/uL — AB (ref 4.40–5.90)
RDW: 16.7 % — ABNORMAL HIGH (ref 11.5–14.5)
WBC: 9.5 10*3/uL (ref 3.8–10.6)

## 2017-06-14 LAB — BASIC METABOLIC PANEL
Anion gap: 11 (ref 5–15)
BUN: 49 mg/dL — AB (ref 6–20)
CHLORIDE: 100 mmol/L — AB (ref 101–111)
CO2: 26 mmol/L (ref 22–32)
CREATININE: 6.34 mg/dL — AB (ref 0.61–1.24)
Calcium: 7.2 mg/dL — ABNORMAL LOW (ref 8.9–10.3)
GFR calc Af Amer: 10 mL/min — ABNORMAL LOW (ref >60)
GFR calc non Af Amer: 9 mL/min — ABNORMAL LOW (ref >60)
GLUCOSE: 103 mg/dL — AB (ref 65–99)
POTASSIUM: 4.9 mmol/L (ref 3.5–5.1)
Sodium: 137 mmol/L (ref 135–145)

## 2017-06-14 LAB — MAGNESIUM: MAGNESIUM: 1.7 mg/dL (ref 1.7–2.4)

## 2017-06-14 MED ORDER — SODIUM CHLORIDE 0.9 % IV SOLN: 100.0000 mL | INTRAVENOUS | Status: DC | PRN

## 2017-06-14 MED ORDER — PENTAFLUOROPROP-TETRAFLUOROETH EX AERO
1.0000 "application " | INHALATION_SPRAY | CUTANEOUS | Status: DC | PRN
  Filled 2017-06-14: qty 30

## 2017-06-14 MED ORDER — LIDOCAINE-PRILOCAINE 2.5-2.5 % EX CREA
1.0000 "application " | TOPICAL_CREAM | CUTANEOUS | Status: DC | PRN
  Filled 2017-06-14: qty 5

## 2017-06-14 MED ORDER — ALTEPLASE 2 MG IJ SOLR: 2.0000 mg | Freq: Once | INTRAMUSCULAR | Status: DC | PRN

## 2017-06-14 MED ORDER — PROPOFOL 10 MG/ML IV BOLUS
INTRAVENOUS | Status: AC
  Filled 2017-06-14: qty 20

## 2017-06-14 MED ORDER — LIDOCAINE HCL (PF) 1 % IJ SOLN
5.0000 mL | INTRAMUSCULAR | Status: DC | PRN
  Filled 2017-06-14: qty 5

## 2017-06-14 NOTE — Progress Notes (Signed)
HD Tx started    06/14/17 0845  Vital Signs  Pulse Rate 84  Pulse Rate Source Monitor  Resp (!) 23  BP 104/62  BP Location Left Arm  BP Method Automatic  Patient Position (if appropriate) Lying  Oxygen Therapy  SpO2 100 %  O2 Device Room Air  During Hemodialysis Assessment  Blood Flow Rate (mL/min) 400 mL/min  Arterial Pressure (mmHg) -150 mmHg  Venous Pressure (mmHg) 120 mmHg  Transmembrane Pressure (mmHg) 50 mmHg  Ultrafiltration Rate (mL/min) 170 mL/min  Dialysate Flow Rate (mL/min) 800 ml/min  Conductivity: Machine  14.1  HD Safety Checks Performed Yes  Dialysis Fluid Bolus Normal Saline  Bolus Amount (mL) 250 mL  Intra-Hemodialysis Comments Tx initiated

## 2017-06-14 NOTE — Progress Notes (Signed)
HD Tx ended    06/14/17 1130  Vital Signs  Pulse Rate 81  Resp (!) 27  BP 121/75  Oxygen Therapy  SpO2 96 %  During Hemodialysis Assessment  HD Safety Checks Performed Yes  Dialysis Fluid Bolus Normal Saline  Bolus Amount (mL) 250 mL  Intra-Hemodialysis Comments Tx completed;Tolerated well

## 2017-06-14 NOTE — Progress Notes (Signed)
[facility]  ROUNDING NOTE   Subjective:   Seen and examined on hemodialysis. Tolerating treatment well. UF goal even.     HEMODIALYSIS FLOWSHEET:  Blood Flow Rate (mL/min): 400 mL/min Arterial Pressure (mmHg): -150 mmHg Venous Pressure (mmHg): 120 mmHg Transmembrane Pressure (mmHg): 50 mmHg Ultrafiltration Rate (mL/min): 170 mL/min Dialysate Flow Rate (mL/min): 800 ml/min Conductivity: Machine : 14 Conductivity: Machine : 14 Dialysis Fluid Bolus: Normal Saline Bolus Amount (mL): 250 mL  Bleeding scan was positive. Endoscopy for today.   Objective:  Vital signs in last 24 hours:  Temp:  [98.3 F (36.8 C)-98.9 F (37.2 C)] 98.4 F (36.9 C) (04/03 1318) Pulse Rate:  [79-131] 79 (04/03 1318) Resp:  [14-27] 26 (04/03 1133) BP: (89-123)/(41-82) 117/77 (04/03 1318) SpO2:  [86 %-100 %] 99 % (04/03 1318) Weight:  [69.3 kg (152 lb 11.2 oz)-69.5 kg (153 lb 3.5 oz)] 69.5 kg (153 lb 3.5 oz) (04/03 0842)  Weight change: -1.497 kg (-3 lb 4.8 oz) Filed Weights   06/13/17 0406 06/14/17 0416 06/14/17 0842  Weight: 65.7 kg (144 lb 14.4 oz) 69.3 kg (152 lb 11.2 oz) 69.5 kg (153 lb 3.5 oz)    Intake/Output: I/O last 3 completed shifts: In: 1786.3 [P.O.:120; I.V.:706.3; Blood:960] Out: 790 [Urine:400; Other:390]   Intake/Output this shift:  No intake/output data recorded.  Physical Exam: General: NAD  Head: Normocephalic, atraumatic. Moist oral mucosal membranes  Eyes: Anicteric  Neck: Supple  Lungs:  Clear to auscultation, normal effort  Heart: Regular rate and rhythm  Abdomen:  Soft, nontender, distended  Extremities: no peripheral edema.  Neurologic: Nonfocal, moving all four extremities  Skin: No lesions  Access: RUE AVF - no bruit or thrill, RIJ permcath    Basic Metabolic Panel: Recent Labs  Lab 06/09/17 1434 06/12/17 1427 06/14/17 0439  NA 139 136 137  K 3.9 6.9* 4.9  CL 98* 100* 100*  CO2 29 22 26   GLUCOSE 117* 77 103*  BUN 34* 74* 49*   CREATININE 6.30* 8.76* 6.34*  CALCIUM 7.9* 7.8* 7.2*  MG  --   --  1.7  PHOS 3.0  2.1*  --   --     Liver Function Tests: Recent Labs  Lab 06/09/17 1434  ALBUMIN 2.3*   No results for input(s): LIPASE, AMYLASE in the last 168 hours. No results for input(s): AMMONIA in the last 168 hours.  CBC: Recent Labs  Lab 06/09/17 1434 06/12/17 1427 06/12/17 1726 06/12/17 2201 06/13/17 0339 06/14/17 0439 06/14/17 1441  WBC 7.3 10.5  --   --   --  9.5  --   HGB 7.9* 5.2* 5.0* 4.3* 5.1* 7.5* 7.2*  HCT 25.6* 17.3* 15.4* 13.7* 15.9* 23.4* 22.5*  MCV 86.6 88.6  --   --   --  87.4  --   PLT 367 374  --   --   --  352  --     Cardiac Enzymes: No results for input(s): CKTOTAL, CKMB, CKMBINDEX, TROPONINI in the last 168 hours.  BNP: Invalid input(s): POCBNP  CBG: No results for input(s): GLUCAP in the last 168 hours.  Microbiology: Results for orders placed or performed during the hospital encounter of 06/02/17  MRSA PCR Screening     Status: None   Collection Time: 06/02/17  4:22 PM  Result Value Ref Range Status   MRSA by PCR NEGATIVE NEGATIVE Final    Comment:        The GeneXpert MRSA Assay (FDA approved for NASAL specimens only),  is one component of a comprehensive MRSA colonization surveillance program. It is not intended to diagnose MRSA infection nor to guide or monitor treatment for MRSA infections. Performed at [facility], [address] [place], [place] [address]   Body fluid culture     Status: None   Collection Time: 06/05/17 10:28 AM  Result Value Ref Range Status   Specimen Description   Final    PERITONEAL Performed at [facility], [address] [place], [place] [address]    Special Requests   Final    NONE Performed at [facility], [address] [place], [place] [address]    Gram Stain   Final    WBC PRESENT,BOTH PMN AND MONONUCLEAR NO ORGANISMS SEEN CYTOSPIN SMEAR    Culture   Final    NO GROWTH 3  DAYS Performed at [facility], [place] [address], [place], [place] [address]    Report Status 06/08/2017 FINAL  Final    Coagulation Studies: No results for input(s): LABPROT, INR in the last 72 hours.  Urinalysis: No results for input(s): COLORURINE, LABSPEC, PHURINE, GLUCOSEU, HGBUR, BILIRUBINUR, KETONESUR, PROTEINUR, UROBILINOGEN, NITRITE, LEUKOCYTESUR in the last 72 hours.  Invalid input(s): APPERANCEUR    Imaging: Nm Gi Blood Loss  Result Date: 06/13/2017 CLINICAL DATA:  Rectal bleeding.  Anemia. EXAM: NUCLEAR MEDICINE GASTROINTESTINAL BLEEDING SCAN TECHNIQUE: Sequential abdominal images were obtained following intravenous administration of Tc-48m labeled red blood cells. RADIOPHARMACEUTICALS:  21.5 mCi Tc-85m pertechnetate in-vitro labeled red cells. COMPARISON:  None. FINDINGS: There is vague activity in the left abdomen best seen during the first hour of imaging suggesting proximal small bowel activity. Activity over the stomach could be from free pertechnetate or bleeding. No definite colonic activity. Expected appearance of the liver, spleen, and blood pool. Genital activity along the pelvis. IMPRESSION: 1. Left upper quadrant activity in small bowel, likely proximal small bowel, compatible with active bleeding. Electronically Signed   By: Van Clines M.D.   On: 06/13/2017 20:31     Medications:   . acetaminophen  1,000 mg Oral Once  . amitriptyline  50 mg Oral QHS  . budesonide (PULMICORT) nebulizer solution  0.5 mg Nebulization BID  . feeding supplement  1 Container Oral TID BM  . furosemide  80 mg Intravenous BID  . ipratropium-albuterol  3 mL Nebulization Q6H  . multivitamin  1 tablet Oral QHS  . multivitamin with minerals  1 tablet Oral Once per day on Mon Wed Fri  . nicotine  7 mg Transdermal Daily  . [START ON 06/16/2017] pantoprazole  40 mg Intravenous Q12H  . tiotropium  18 mcg Inhalation Daily  . vitamin C  500 mg Oral BID      Assessment/ Plan:   [name] is a 57 y.o. black male withend stage renal disease on hemodialysis secondary to Alport's syndrome, ascites, hypertension, anemia of chronic kidney disease, coronary artery disease, peripheral vascular disease, hyperlipidemia, gastrointestinal AVMs, pulmonary hypertension,   nooutpatientdialysis unit currently  1.  Hyperkalemia 2.  Anemia of chronic kidney disease and GI Bleed. Status post 2 units PRBC on 4/2 3.  Secondary Hyperparathyroidism    4.  Ascites 5.  End-stage renal disease  Plan:  Continue MWF schedule. Seen and examined on hemodialysis. Tolerating treatment well. Minimal ultrafiltration Discussed case with Dr. Jerelyn Charles who is to speak to patient about plan of care and advanced directives.    LOS: 2 [name] 4/3/20193:35 PM

## 2017-06-14 NOTE — Progress Notes (Signed)
Patient has no acute event overnight. VS remained stable and NSR on the monitor. Patient has 2 units of PRBC without any adverse reaction. Patient is kept NPO overnight per order for possible push enteroscopy.

## 2017-06-14 NOTE — Progress Notes (Signed)
Family Meeting Note  Advance Directive:yes  Today a meeting took place with the Patient.  Patient is able to participate   The following clinical team members were present during this meeting:MD  The following were discussed:Patient's diagnosis: , Patient's progosis: Unable to determine and Goals for treatment: Full Code  Additional follow-up to be provided: prn  Time spent during discussion:20 minutes  [name] [name], MD  

## 2017-06-14 NOTE — Progress Notes (Signed)
Post HD Tx    06/14/17 1133  Vital Signs  Pulse Rate 82  Resp (!) 26  BP 123/68  Oxygen Therapy  SpO2 100 %  Post-Hemodialysis Assessment  Rinseback Volume (mL) 250 mL  Dialyzer Clearance Lightly streaked  Duration of HD Treatment -hour(s) 3 hour(s)  Hemodialysis Intake (mL) 500 mL  UF Total -Machine (mL) 500 mL  Net UF (mL) 0 mL  Tolerated HD Treatment Yes  Hemodialysis Catheter Right Internal jugular Double-lumen  No Placement Date or Time found.   Placed prior to admission: Yes  Orientation: Right  Access Location: Internal jugular  Hemodialysis Catheter Type: Double-lumen  Site Condition No complications  Blue Lumen Status Heparin locked  Red Lumen Status Heparin locked  Catheter fill solution Heparin 1000 units/ml  Dressing Type Biopatch;Occlusive  Dressing Status Clean;Dry;Intact  Interventions Dressing changed  Drainage Description None  Dressing Change Due 06/21/17  Post treatment catheter status Capped and Clamped

## 2017-06-14 NOTE — Consult Note (Signed)
[facility] Vascular Consult Note  MRN : [id number]  Stanley [name] is a 57 y.o. ([birth year]) male who presents with chief complaint of  Chief Complaint  Patient presents with  . regular dialysis  . Rectal Bleeding  .  History of Present Illness:   I am asked to evaluate the patient by Dr. Juleen China.  The patient is admitted for GI bleed.   The patient is seen for evaluation of dialysis access.  The patient has a history of multiple failed accesses.  There have been accesses in both arms.    Current access is via a catheter which is functioning well.  There have not been any episodes of catheter infection.  The patient denies amaurosis fugax or recent TIA symptoms. There are no recent neurological changes noted. The patient denies claudication symptoms or rest pain symptoms. The patient denies history of DVT, PE or superficial thrombophlebitis. The patient denies recent episodes of angina or shortness of breath.    Current Facility-Administered Medications  Medication Dose Route Frequency Provider Last Rate Last Dose  . 0.9 %  sodium chloride infusion   Intravenous Once Salary, Montell D, MD      . acetaminophen (TYLENOL) tablet 1,000 mg  1,000 mg Oral Once Salary, Montell D, MD      . acetaminophen (TYLENOL) tablet 650 mg  650 mg Oral TID PRN Salary, Holly Bodily D, MD   650 mg at 06/14/17 1732  . albuterol (PROVENTIL) (2.5 MG/3ML) 0.083% nebulizer solution 2.5 mg  2.5 mg Inhalation Q6H PRN Gouru, Aruna, MD      . amitriptyline (ELAVIL) tablet 50 mg  50 mg Oral QHS Salary, Holly Bodily D, MD   50 mg at 06/13/17 2123  . budesonide (PULMICORT) nebulizer solution 0.5 mg  0.5 mg Nebulization BID Salary, Montell D, MD   0.5 mg at 06/14/17 0804  . feeding supplement (BOOST / RESOURCE BREEZE) liquid 1 Container  1 Container Oral TID BM Kolluru, Sarath, MD   1 Container at 06/14/17 1407  . furosemide (LASIX) injection 80 mg  80 mg Intravenous BID Loney Hering D, MD   80  mg at 06/14/17 1733  . gabapentin (NEURONTIN) capsule 300 mg  300 mg Oral QHS PRN Salary, Montell D, MD      . guaiFENesin-dextromethorphan (ROBITUSSIN DM) 100-10 MG/5ML syrup 5 mL  5 mL Oral Q4H PRN Gouru, Aruna, MD   5 mL at 06/14/17 1411  . ipratropium-albuterol (DUONEB) 0.5-2.5 (3) MG/3ML nebulizer solution 3 mL  3 mL Nebulization Q6H Gouru, Aruna, MD   3 mL at 06/14/17 1437  . multivitamin (RENA-VIT) tablet 1 tablet  1 tablet Oral QHS Kolluru, Sarath, MD   1 tablet at 06/13/17 2123  . multivitamin with minerals tablet 1 tablet  1 tablet Oral Once per day on Mon Wed Fri Lavonia Dana, MD   1 tablet at 06/14/17 1254  . nicotine (NICODERM CQ - dosed in mg/24 hr) patch 7 mg  7 mg Transdermal Daily Gouru, Aruna, MD   7 mg at 06/14/17 1407  . nitroGLYCERIN (NITROSTAT) SL tablet 0.4 mg  0.4 mg Sublingual Q5 min PRN Gouru, Aruna, MD      . octreotide (SANDOSTATIN) 500 mcg in sodium chloride 0.9 % 250 mL (2 mcg/mL) infusion  50 mcg/hr Intravenous Continuous Salary, Montell D, MD 25 mL/hr at 06/14/17 0719 50 mcg/hr at 06/14/17 0719  . ondansetron (ZOFRAN) tablet 4 mg  4 mg Oral Q6H PRN Nicholes Mango, MD  Or  . ondansetron (ZOFRAN) injection 4 mg  4 mg Intravenous Q6H PRN Gouru, Aruna, MD      . pantoprazole (PROTONIX) 80 mg in sodium chloride 0.9 % 250 mL (0.32 mg/mL) infusion  8 mg/hr Intravenous Continuous Salary, Montell D, MD 25 mL/hr at 06/14/17 1443 8 mg/hr at 06/14/17 1443  . [START ON 06/16/2017] pantoprazole (PROTONIX) injection 40 mg  40 mg Intravenous Q12H Salary, Montell D, MD      . sodium chloride (OCEAN) 0.65 % nasal spray 1 spray  1 spray Each Nare PRN Gouru, Aruna, MD   1 spray at 06/12/17 2250  . tiotropium (SPIRIVA) inhalation capsule 18 mcg  18 mcg Inhalation Daily Gouru, Aruna, MD   18 mcg at 06/14/17 1253  . traMADol (ULTRAM) tablet 50 mg  50 mg Oral Q6H PRN Gouru, Aruna, MD   50 mg at 06/14/17 1254  . vitamin C (ASCORBIC ACID) tablet 500 mg  500 mg Oral BID Lavonia Dana, MD    500 mg at 06/14/17 1407    Past Medical History:  Diagnosis Date  . Alcohol abuse   . CHF (congestive heart failure) ([place])   . Cirrhosis ([place])   . Coronary artery disease 2009  . Diabetic peripheral neuropathy ([place])   . Drug abuse ([place])   . End stage renal disease on dialysis [facility]) NEPHROLOGIST-   [facility]  IN [place]   HEMODIALYSIS --   TUES/  THURS/  SAT  . Gastrointestinal bleed 06/13/2017   From chart...hx of multiple GI bleeds  . GERD (gastroesophageal reflux disease)   . Hyperlipidemia   . Hypertension   . PAD (peripheral artery disease) ([place])   . Renal insufficiency    Per pt, 32 oz fluid restriction per day  . S/P triple vessel bypass 06/09/2016   2009ish  . Suicidal ideation    & HOMICIDAL IDEATION --  06-16-2013   ADMITTED TO BEHAVIOR HEALTH    Past Surgical History:  Procedure Laterality Date  . A/V FISTULAGRAM Right 06/06/2017   Procedure: A/V FISTULAGRAM;  Surgeon: Katha Cabal, MD;  Location: [place] [facility];  Service: Cardiovascular;  Laterality: Right;  . A/V SHUNT INTERVENTION N/A 06/06/2017   Procedure: A/V SHUNT INTERVENTION;  Surgeon: Katha Cabal, MD;  Location: [place] [facility];  Service: Cardiovascular;  Laterality: N/A;  . AGILE CAPSULE N/A 06/19/2016   Procedure: AGILE CAPSULE;  Surgeon: [name], MD;  Location: [facility] ENDOSCOPY;  Service: Endoscopy;  Laterality: N/A;  . COLONOSCOPY WITH PROPOFOL N/A 06/18/2016   Procedure: COLONOSCOPY WITH PROPOFOL;  Surgeon: [name], MD;  Location: [facility] ENDOSCOPY;  Service: Endoscopy;  Laterality: N/A;  . COLONOSCOPY WITH PROPOFOL N/A 08/12/2016   Procedure: COLONOSCOPY WITH PROPOFOL;  Surgeon: [name], MD;  Location: [facility] ENDOSCOPY;  Service: Endoscopy;  Laterality: N/A;  . COLONOSCOPY WITH PROPOFOL N/A 05/05/2017   Procedure: COLONOSCOPY WITH PROPOFOL;  Surgeon: Manya Silvas, MD;  Location: [facility] ENDOSCOPY;  Service: Endoscopy;  Laterality: N/A;  . CORONARY ANGIOPLASTY  ?   PT UNABLE  TO TELL IF  BEFORE OR AFTER  CABG  . CORONARY ARTERY BYPASS GRAFT  2008  ([place])   3 VESSEL  . DIALYSIS FISTULA CREATION  LAST SURGERY  APPOX  2008  . ENTEROSCOPY N/A 05/10/2016   Procedure: ENTEROSCOPY;  Surgeon: Jerene Bears, MD;  Location: [place];  Service: Gastroenterology;  Laterality: N/A;  . ENTEROSCOPY N/A 08/12/2016   Procedure: ENTEROSCOPY;  Surgeon: [name], MD;  Location: [facility] ENDOSCOPY;  Service: Endoscopy;  Laterality: N/A;  . ENTEROSCOPY Left 06/03/2017   Procedure: ENTEROSCOPY;  Surgeon: Virgel Manifold, MD;  Location: [facility] ENDOSCOPY;  Service: Endoscopy;  Laterality: Left;  Procedure date will ultimately depend on when patient is medically optimized before the procedure, pending hemodialysis and blood transfusions etc. Will place on schedule and change depending on clinical status.   . ENTEROSCOPY N/A 06/05/2017   Procedure: ENTEROSCOPY;  Surgeon: Virgel Manifold, MD;  Location: [facility] ENDOSCOPY;  Service: Endoscopy;  Laterality: N/A;  . ESOPHAGOGASTRODUODENOSCOPY N/A 05/07/2015   Procedure: ESOPHAGOGASTRODUODENOSCOPY (EGD);  Surgeon: Hulen Luster, MD;  Location[facility] ENDOSCOPY;  Service: Endoscopy;  Laterality: N/A;  . ESOPHAGOGASTRODUODENOSCOPY (EGD) WITH PROPOFOL N/A 05/17/2015   Procedure: ESOPHAGOGASTRODUODENOSCOPY (EGD) WITH PROPOFOL;  Surgeon: [name], MD;  Location: [facility] ENDOSCOPY;  Service: Endoscopy;  Laterality: N/A;  . ESOPHAGOGASTRODUODENOSCOPY (EGD) WITH PROPOFOL N/A 01/20/2016   Procedure: ESOPHAGOGASTRODUODENOSCOPY (EGD) WITH PROPOFOL;  Surgeon: [name], MD;  Location: [facility] ENDOSCOPY;  Service: Endoscopy;  Laterality: N/A;  . ESOPHAGOGASTRODUODENOSCOPY (EGD) WITH PROPOFOL N/A 04/17/2016   Procedure: ESOPHAGOGASTRODUODENOSCOPY (EGD) WITH PROPOFOL;  Surgeon: Lin Landsman, MD;  Location: [facility] ENDOSCOPY;  Service: Gastroenterology;  Laterality: N/A;  . ESOPHAGOGASTRODUODENOSCOPY (EGD) WITH PROPOFOL  05/09/2016   Procedure:  ESOPHAGOGASTRODUODENOSCOPY (EGD) WITH PROPOFOL;  Surgeon: Jerene Bears, MD;  Location: [place];  Service: Endoscopy;;  . ESOPHAGOGASTRODUODENOSCOPY (EGD) WITH PROPOFOL N/A 06/16/2016   Procedure: ESOPHAGOGASTRODUODENOSCOPY (EGD) WITH PROPOFOL;  Surgeon: [name], MD;  Location: [facility] ENDOSCOPY;  Service: Endoscopy;  Laterality: N/A;  . ESOPHAGOGASTRODUODENOSCOPY (EGD) WITH PROPOFOL N/A 05/05/2017   Procedure: ESOPHAGOGASTRODUODENOSCOPY (EGD) WITH PROPOFOL;  Surgeon: Manya Silvas, MD;  Location: [facility] ENDOSCOPY;  Service: Endoscopy;  Laterality: N/A;  . GIVENS CAPSULE STUDY N/A 05/07/2016   Procedure: GIVENS CAPSULE STUDY;  Surgeon: Doran Stabler, MD;  Location: [place];  Service: Endoscopy;  Laterality: N/A;  . MANDIBULAR HARDWARE REMOVAL N/A 07/29/2013   Procedure: REMOVAL OF ARCH BARS;  Surgeon: Theodoro Kos, DO;  Location: [place];  Service: Plastics;  Laterality: N/A;  . ORIF MANDIBULAR FRACTURE N/A 06/05/2013   Procedure: REPAIR OF MANDIBULAR FRACTURE x 2 with maxillo-mandibular fixation ;  Surgeon: Theodoro Kos, DO;  Location: [place];  Service: Plastics;  Laterality: N/A;  . PERIPHERAL ARTERIAL STENT GRAFT Left     Social History Social History   Tobacco Use  . Smoking status: Current Every Day Smoker    Packs/day: 0.15    Years: 40.00    Pack years: 6.00    Types: Cigarettes  . Smokeless tobacco: Never Used  Substance Use Topics  . Alcohol use: No    Comment: pt reports quitting after learning about cirrhosis  . Drug use: No    Frequency: 7.0 times per week    Types: Marijuana, Cocaine    Family History Family History  Problem Relation Age of Onset  . Colon cancer Mother   . Cancer Father   . Cancer Sister   . Kidney disease Brother   No family history of bleeding/clotting disorders, porphyria or autoimmune disease   No Known Allergies   REVIEW OF SYSTEMS (Negative unless checked)  Constitutional: [] Weight loss  [] Fever   [] Chills Cardiac: [] Chest pain   [] Chest pressure   [] Palpitations   [] Shortness of breath when laying flat   [] Shortness of breath at rest   [] Shortness of breath with exertion. Vascular:  [] Pain in legs with walking   [] Pain in legs at  rest   [] Pain in legs when laying flat   [] Claudication   [] Pain in feet when walking  [] Pain in feet at rest  [] Pain in feet when laying flat   [] History of DVT   [] Phlebitis   [] Swelling in legs   [] Varicose veins   [] Non-healing ulcers Pulmonary:   [] Uses home oxygen   [] Productive cough   [] Hemoptysis   [] Wheeze  [] COPD   [] Asthma Neurologic:  [] Dizziness  [] Blackouts   [] Seizures   [] History of stroke   [] History of TIA  [] Aphasia   [] Temporary blindness   [] Dysphagia   [] Weakness or numbness in arms   [] Weakness or numbness in legs Musculoskeletal:  [] Arthritis   [] Joint swelling   [] Joint pain   [] Low back pain Hematologic:  [] Easy bruising  [] Easy bleeding   [] Hypercoagulable state   [] Anemic  [] Hepatitis Gastrointestinal:  [x] Blood in stool   [] Vomiting blood  [] Gastroesophageal reflux/heartburn   [] Difficulty swallowing. Genitourinary:  [x] Chronic kidney disease   [] Difficult urination  [] Frequent urination  [] Burning with urination   [] Blood in urine Skin:  [] Rashes   [] Ulcers   [] Wounds Psychological:  [] History of anxiety   []  History of major depression.    Physical Examination  Vitals:   06/14/17 1133 06/14/17 1318 06/14/17 1638 06/14/17 1730  BP: 123/68 117/77 119/76 118/63  Pulse: 82 79 86 86  Resp: (!) 26     Temp:  98.4 F (36.9 C) 98.2 F (36.8 C)   TempSrc:  Oral Oral   SpO2: 100% 99% 94%   Weight:      Height:       Body mass index is 19.15 kg/m.  Head: [place]/AT, No temporalis wasting. Prominent temp pulse not noted. Ear/Nose/Throat: Nares w/o erythema or drainage, oropharynx w/o obsrtuction, Mallampati score: 2.  Dentition poor.  Eyes: PERRLA, Sclera nonicteric.  Neck: Supple, no nuchal rigidity.  No bruit or JVD.  Pulmonary:   Breath sounds equal bilaterally, no use of accessory muscles.  Cardiac: RRR, normal S1, S2, no Murmurs, rubs or gallops. Vascular: Thrombosed dialysis access disease both arms, right IJ catheter clean dry and intact in good condition Gastrointestinal: soft, non-tender, non-distended.  Musculoskeletal: Moves all extremities.  No deformity or atrophy. No edema. Neurologic: CN 2-12 intact. Symmetrical.  Speech is fluent.  Psychiatric: Judgment intact, Mood & affect appropriate for pt's clinical situation. Dermatologic: No rashes or ulcers noted.  No cellulitis or open wounds. Lymph : No Cervical,  or Inguinal lymphadenopathy.      CBC Lab Results  Component Value Date   WBC 9.5 06/14/2017   HGB 7.2 (L) 06/14/2017   HCT 22.5 (L) 06/14/2017   MCV 87.4 06/14/2017   PLT 352 06/14/2017    BMET    Component Value Date/Time   NA 137 06/14/2017 0439   NA 143 06/10/2014 1251   K 4.9 06/14/2017 0439   K 5.1 06/10/2014 1251   CL 100 (L) 06/14/2017 0439   CL 102 06/10/2014 1251   CO2 26 06/14/2017 0439   CO2 30 06/10/2014 1251   GLUCOSE 103 (H) 06/14/2017 0439   GLUCOSE 126 (H) 06/10/2014 1251   BUN 49 (H) 06/14/2017 0439   BUN 20 06/10/2014 1251   CREATININE 6.34 (H) 06/14/2017 0439   CREATININE 5.42 (H) 06/10/2014 1251   CALCIUM 7.2 (L) 06/14/2017 0439   CALCIUM 8.9 06/10/2014 1251   GFRNONAA 9 (L) 06/14/2017 0439   GFRNONAA 11 (L) 06/10/2014 1251   GFRAA 10 (L) 06/14/2017 9833  GFRAA 13 (L) 06/10/2014 1251   Estimated Creatinine Clearance: 12.5 mL/min (A) (by C-G formula based on SCr of 6.34 mg/dL (H)).  COAG Lab Results  Component Value Date   INR 1.08 05/25/2017   INR 1.14 05/03/2017   INR 1.03 03/24/2017     Assessment/Plan 1.  Complication dialysis device with thrombosis AV access:  Patient's arm dialysis access is thrombosed. The patient will undergo evaluation for creation of a new access.  Attempted thrombectomy was unsuccessful.   2.  End-stage renal disease  requiring hemodialysis:  Patient will continue dialysis therapy without further interruption via the catheter that is already in placed. Dialysis has already been arranged since the patient missed their previous session 3.  Hypertension:  Patient will continue medical management; nephrology is following no changes in oral medications. 4. Diabetes mellitus:  Glucose will be monitored and oral medications been held this morning once the patient has undergone the patient's procedure po intake will be reinitiated and again Accu-Cheks will be used to assess the blood glucose level and treat as needed. The patient will be restarted on the patient's usual hypoglycemic regime 5.  GI bleed: Patient currently undergoing workup H&H's are being monitored he will be transfused as needed.     Hortencia Pilar, MD  06/14/2017 6:10 PM

## 2017-06-14 NOTE — Progress Notes (Signed)
Pre HD Assessment.    06/14/17 0820  Neurological  Level of Consciousness Alert  Orientation Level Oriented X4  Respiratory  Respiratory Pattern Regular  Chest Assessment Chest expansion symmetrical  Bilateral Breath Sounds Diminished;Clear  Cough Non-productive  Cardiac  Pulse Irregular  Heart Sounds S1, S2  ECG Monitor Yes  Vascular  R Radial Pulse +2  L Radial Pulse +2  Generalized Edema None  Psychosocial  Psychosocial (WDL) WDL

## 2017-06-14 NOTE — Progress Notes (Signed)
Stanley [name] at [place] NAME: Stanley [name]    MR#:  [id number]  DATE OF BIRTH:  21-Aug-1960  SUBJECTIVE:  CHIEF COMPLAINT:   Chief Complaint  Patient presents with  . regular dialysis  . Rectal Bleeding  Patient continues to complain of dark lower GI bleeding, in discussion with gastroenterology-plans for upper extensive endoscopy later today and or tomorrow, patient currently receiving hemodialysis, patient threatening to leave [place]:  CONSTITUTIONAL: No fever, fatigue or weakness.  EYES: No blurred or double vision.  EARS, NOSE, AND THROAT: No tinnitus or ear pain.  RESPIRATORY: No cough, shortness of breath, wheezing or hemoptysis.  CARDIOVASCULAR: No chest pain, orthopnea, edema.  GASTROINTESTINAL: No nausea, vomiting, diarrhea or abdominal pain.  GENITOURINARY: No dysuria, hematuria.  ENDOCRINE: No polyuria, nocturia,  HEMATOLOGY: No anemia, easy bruising or bleeding SKIN: No rash or lesion. MUSCULOSKELETAL: No joint pain or arthritis.   NEUROLOGIC: No tingling, numbness, weakness.  PSYCHIATRY: No anxiety or depression.   ROS  DRUG ALLERGIES:  No Known Allergies  VITALS:  Blood pressure 117/77, pulse 79, temperature 98.4 F (36.9 C), temperature source Oral, resp. rate (!) 26, height 6\' 3"  (1.905 m), weight 69.5 kg (153 lb 3.5 oz), SpO2 99 %.  PHYSICAL EXAMINATION:  GENERAL:  57 y.o.-year-old patient lying in the bed with no acute distress.  EYES: Pupils equal, round, reactive to light and accommodation. No scleral icterus. Extraocular muscles intact.  HEENT: Head atraumatic, normocephalic. Oropharynx and nasopharynx clear.  NECK:  Supple, no jugular venous distention. No thyroid enlargement, no tenderness.  LUNGS: Normal breath sounds bilaterally, no wheezing, rales,rhonchi or crepitation. No use of accessory muscles of respiration.  CARDIOVASCULAR: S1, S2 normal. No murmurs, rubs, or  gallops.  ABDOMEN: Soft, nontender, nondistended. Bowel sounds present. No organomegaly or mass.  EXTREMITIES: No pedal edema, cyanosis, or clubbing.  NEUROLOGIC: Cranial nerves II through XII are intact. Muscle strength 5/5 in all extremities. Sensation intact. Gait not checked.  PSYCHIATRIC: The patient is alert and oriented x 3.  SKIN: No obvious rash, lesion, or ulcer.   Physical Exam LABORATORY PANEL:   CBC Recent Labs  Lab 06/14/17 0439  WBC 9.5  HGB 7.5*  HCT 23.4*  PLT 352   ------------------------------------------------------------------------------------------------------------------  Chemistries  Recent Labs  Lab 06/14/17 0439  NA 137  K 4.9  CL 100*  CO2 26  GLUCOSE 103*  BUN 49*  CREATININE 6.34*  CALCIUM 7.2*  MG 1.7   ------------------------------------------------------------------------------------------------------------------  Cardiac Enzymes No results for input(s): TROPONINI in the last 168 hours. ------------------------------------------------------------------------------------------------------------------  RADIOLOGY:  Nm Gi Blood Loss  Result Date: 06/13/2017 CLINICAL DATA:  Rectal bleeding.  Anemia. EXAM: NUCLEAR MEDICINE GASTROINTESTINAL BLEEDING SCAN TECHNIQUE: Sequential abdominal images were obtained following intravenous administration of Tc-32m labeled red blood cells. RADIOPHARMACEUTICALS:  21.5 mCi Tc-40m pertechnetate in-vitro labeled red cells. COMPARISON:  None. FINDINGS: There is vague activity in the left abdomen best seen during the first hour of imaging suggesting proximal small bowel activity. Activity over the stomach could be from free pertechnetate or bleeding. No definite colonic activity. Expected appearance of the liver, spleen, and blood pool. Genital activity along the pelvis. IMPRESSION: 1. Left upper quadrant activity in small bowel, likely proximal small bowel, compatible with active bleeding. Electronically Signed    By: Van Clines M.D.   On: 06/13/2017 20:31    ASSESSMENT AND PLAN:  1 acute melena GI bleed  Continued dark lower GI bleeding  Noted history of cirrhosis, peptic ulcer disease, history of intestinal AVMs Hematemesis and melena on admission In discussion with gastroenterology today/Dr. Allen Norris -patient has had extensive upper and lower frequent endoscopies by his gastroenterology group/patient noncompliant with following up at [facility] for further workup/gastroenterology planning extensive endoscopy later today and or tomorrow for further evaluation, nuclear medicine testing noted for left left upper quadrant bleeding, s/p 2 units packed red blood cells, H&H every 6 hours, CBC daily, and transfuse as needed Most recent endoscopy shown gastritis  2 acute symptomatic anemia Improved status post transfusion Plan of care per above  3 acute hyperkalemia Resolved with Kayexalate and HD  4 chronic ESRD HD for Monday Wednesday and Friday Nephrology input appreciated-currently receiving hemodialysis  5 chronic tobacco abuse disorder Stable Nicotine patch and cessation counseling ordered  6 COPD without exacerbation Noted chronic cough Breathing treatments as needed, Pulmicort  7 acute on chronic malnutrition  Dietary consulted  8 acute on chronic noncompliance of medical management Patient threatening to leave AMA despite serious medical condition All questions answered noted long history of noncompliance with medical management/follow-up per gastroenterology discussion Consult palliative care services  Disposition pending clinical course  All the records are reviewed and case discussed with Care Management/Social Workerr. Management plans discussed with the patient, family and they are in agreement.  CODE STATUS: full  TOTAL TIME TAKING CARE OF THIS PATIENT: 35 minutes.     POSSIBLE D/C IN 1-3 DAYS, DEPENDING ON CLINICAL CONDITION.   Avel Peace Flor Houdeshell M.D on 06/14/2017    Between 7am to 6pm - Pager - [phone]  After 6pm go to [email or web address] - password [email or web address] [facility]  Office  [phone]  CC: Primary care physician; [name], MD  Note: This dictation was prepared with Dragon dictation along with smaller phrase technology. Any transcriptional errors that result from this process are unintentional.

## 2017-06-14 NOTE — Progress Notes (Signed)
Post HD Assessment    06/14/17 1140  Neurological  Level of Consciousness Alert  Orientation Level Oriented X4  Respiratory  Respiratory Pattern Regular  Chest Assessment Chest expansion symmetrical  Bilateral Breath Sounds Diminished;Clear  Cough Non-productive  Cardiac  Pulse Irregular  Heart Sounds S1, S2  ECG Monitor Yes  Vascular  R Radial Pulse +2  L Radial Pulse +2  Generalized Edema None  Psychosocial  Psychosocial (WDL) WDL

## 2017-06-14 NOTE — Consult Note (Addendum)
Consultation Note Date: 06/14/2017   Patient Name: Stanley [name]  DOB: 1960-07-19  MRN: [id number]  Age / Sex: 57 y.o., male  PCP: [name], MD Referring Physician: Gorden Harms, MD  Reason for Consultation: Establishing goals of care, frequent hospitalizations, longstanding history of noncompliance   HPI/Patient Profile: 56 y.o. male admitted on 06/12/2017 from home with complaints of shortness of breath and abdominal pain associated with rectal bleeding and hematemesis. He has a past medical history significant for end-state renal disease (HD M,W,F), CHF, cirrhosis, coronary artery disease, diabetic peripheral neuropathy, drug abuse, alcohol abuse, hyperlipidemia, HTN, GERD, anemia, GI bleed, COPD, and triple vessel bypass (2009). He currently comes into the ED to receive dialysis as he is not able to receive dialysis in the outpatient setting due to behavior. He has a longstanding history of medical noncompliance with multiple admissions resulting in leaving AMA. He received dialysis on 4/1 and 4/3 without complications. GI has been consulted due to acute melena GI bleed. He has been seen by multiple Gastroenterologist which requested him to follow up with [facility] for further work up. Patient is s/p 2 units PRBC and per GI patient will undergo endoscopy within the next 24 hours. Palliative Medicine team consulted for goals of care discussion in relation to frequent hospitalizations and longstanding medical noncompliance despite severity of health status.    Clinical Assessment and Goals of Care: I have reviewed medical records including lab results, imaging, Epic notes, and MAR, received report from the bedside RN, and assessed the patient. I then met at the bedside with patient  to discuss diagnosis prognosis, GOC, disposition and options. Patient is alert and oriented x3 and able to engage in conversation. He  verbalized not needing to include any other family members in this discussion.   I introduced Palliative Medicine as specialized medical care for people living with serious illness. It focuses on providing relief from the symptoms and stress of a serious illness. The goal is to improve quality of life for both the patient and the family. Patient was reluctant to conversation at first but did agree to continue.   Patient did not want to discuss much about his home life and background. Verbalized "that was none of anyone's damn business". As far as functional and nutritional status he states he was independent prior to admission. He was able to ambulate without assistive devices and perform ADLs. His sister's often would check in on him and run errands. They would prepare food for him at times. He stated he was receiving CNA assistance in the home at one period however, [facility] discontinued their services. He does not drive and utilize a medical transportation program for appointments such as dialysis.   We discussed his current illness and what it means in the larger context of his on-going co-morbidities.  Natural disease trajectory and expectations were discussed.  I attempted to elicit values and goals of care important to the patient.    The difference between aggressive medical intervention and comfort care was considered  in light of the patient's goals of care. Advanced directives, concepts specific to code status, artifical feeding and hydration, and rehospitalization were considered and discussed. Patient very direct in expressing his wishes. He verbalized at this time no matter what he was not to stop dialysis, he wishes to continue with full aggressive measures include CPR, intubation, and other life sustaining measures. He verbalized "yall need to do whatever it is that needs to be done!" We discussed having a HCPOA in the event he was unable to make medical decisions for himself. He denies  having appropriate documents that specified a proxy, but did verbalized if he was to get in that state his sisters Reginold Agent and Judson Roch) would make those decisions. Offered to have Chaplain services to come and complete advance directives however patient very directly declined and verbalized "I don't need that type of shit done, they know what to do if something happens.!" Asked to clarify and he verbalized "just what I said, do everything!"   Palliative Care services outpatient were explained and offered. We did not discuss hospice services as patient seemed guarded with conversation in general. He verbalized not being interested in any form of home follow up including CNA or therapy etc. Reports he had in home services in the past and again after they got into a routine services stopped and he also feels that people coming into the home are a way for [facility] to send people in his home to spy on him and be nosey. Attempted to explain they are more for support and had not connection to [place]. He did nicely verbalized appreciation and stated talk to me about that some other time.   He did agree and ask for me to contact his sister Reginold Agent and give her updates. I attempted twice with no response. I let him know that her voicemail was not set up and if I did not get a call back or in contact with her today I would try again tomorrow. He verbalized understanding and appreciation.  Questions and concerns were addressed. Patient was encouraged to call with questions or concerns.   PMT will continue to support holistically.  PATIENT is alert and oriented x3 and able to make competent decisions regarding his health wishes.     SUMMARY OF RECOMMENDATIONS    FULL CODE/FULL SCOPE per patient's request   Continue to treat the treatable   Palliative will continue to support patient, family, and medical team during hospitalization. Will plan to contact sister with updates as well as follow-up with patient  tomorrow to assess in changes and consideration for outpatient services.   Code Status/Advance Care Planning:  Full code per patient's request  Palliative Prophylaxis:   Bowel Regimen and Frequent Pain Assessment  Additional Recommendations (Limitations, Scope, Preferences):  Full Scope Treatment patient request to continue with full aggressive treatment   Psycho-social/Spiritual:   Desire for further Chaplaincy support:No   Prognosis:   Unable to determine-Guarded to poor in the setting of end-stage renal disease, CHF, COPD, cirrhosis, recurrent GI bleed, anemia, HD, malnutrition, longstanding medical noncompliance   Discharge Planning: To Be Determined -Patient's goal is to return home at discharge      Primary Diagnoses: Present on Admission: . GI bleed   I have reviewed the medical record, interviewed the patient and family, and examined the patient. The following aspects are pertinent.  Past Medical History:  Diagnosis Date  . Alcohol abuse   . CHF (congestive heart failure) ([place])   . Cirrhosis (  [place])   . Coronary artery disease 2009  . Diabetic peripheral neuropathy ([place])   . Drug abuse ([place])   . End stage renal disease on dialysis [facility]) NEPHROLOGIST-   DR [facility]  IN [place]   HEMODIALYSIS --   TUES/  THURS/  SAT  . Gastrointestinal bleed 06/13/2017   From chart...hx of multiple GI bleeds  . GERD (gastroesophageal reflux disease)   . Hyperlipidemia   . Hypertension   . PAD (peripheral artery disease) ([place])   . Renal insufficiency    Per pt, 32 oz fluid restriction per day  . S/P triple vessel bypass 06/09/2016   2009ish  . Suicidal ideation    & HOMICIDAL IDEATION --  06-16-2013   ADMITTED TO BEHAVIOR HEALTH   Social History   Socioeconomic History  . Marital status: Single    Spouse name: Not on file  . Number of children: Not on file  . Years of education: Not on file  . Highest education level: Not on file  Occupational History  . Occupation:  disabled  Social Needs  . Financial resource strain: Not on file  . Food insecurity:    Worry: Not on file    Inability: Not on file  . Transportation needs:    Medical: Not on file    Non-medical: Not on file  Tobacco Use  . Smoking status: Current Every Day Smoker    Packs/day: 0.15    Years: 40.00    Pack years: 6.00    Types: Cigarettes  . Smokeless tobacco: Never Used  Substance and Sexual Activity  . Alcohol use: No    Comment: pt reports quitting after learning about cirrhosis  . Drug use: No    Frequency: 7.0 times per week    Types: Marijuana, Cocaine  . Sexual activity: Never  Lifestyle  . Physical activity:    Days per week: Not on file    Minutes per session: Not on file  . Stress: Not on file  Relationships  . Social connections:    Talks on phone: Not on file    Gets together: Not on file    Attends religious service: Not on file    Active member of club or organization: Not on file    Attends meetings of clubs or organizations: Not on file    Relationship status: Not on file  Other Topics Concern  . Not on file  Social History Narrative  . Not on file   Family History  Problem Relation Age of Onset  . Colon cancer Mother   . Cancer Father   . Cancer Sister   . Kidney disease Brother    Scheduled Meds: . acetaminophen  1,000 mg Oral Once  . amitriptyline  50 mg Oral QHS  . budesonide (PULMICORT) nebulizer solution  0.5 mg Nebulization BID  . feeding supplement  1 Container Oral TID BM  . furosemide  80 mg Intravenous BID  . ipratropium-albuterol  3 mL Nebulization Q6H  . multivitamin  1 tablet Oral QHS  . multivitamin with minerals  1 tablet Oral Once per day on Mon Wed Fri  . nicotine  7 mg Transdermal Daily  . [START ON 06/16/2017] pantoprazole  40 mg Intravenous Q12H  . tiotropium  18 mcg Inhalation Daily  . vitamin C  500 mg Oral BID   Continuous Infusions: . sodium chloride    . octreotide  (SANDOSTATIN)    IV infusion 50 mcg/hr  (06/14/17 0719)  . pantoprozole (PROTONIX)  infusion 8 mg/hr (06/14/17 1443)   PRN Meds:.acetaminophen, albuterol, gabapentin, guaiFENesin-dextromethorphan, nitroGLYCERIN, ondansetron **OR** ondansetron (ZOFRAN) IV, sodium chloride, traMADol Medications Prior to Admission:  Prior to Admission medications   Medication Sig Start Date End Date Taking? Authorizing Provider  albuterol (PROVENTIL HFA;VENTOLIN HFA) 108 (90 Base) MCG/ACT inhaler Inhale 2 puffs into the lungs every 6 (six) hours as needed for wheezing or shortness of breath. 04/21/17   Lisa Roca, MD  alum & mag hydroxide-simeth (MAALOX/MYLANTA) 200-200-20 MG/5ML suspension Take 15 mLs every 4 (four) hours as needed by mouth for indigestion or heartburn. Patient not taking: Reported on 04/19/2017 01/25/17   Demetrios Loll, MD  atorvastatin (LIPITOR) 40 MG tablet Take 1 tablet (40 mg total) by mouth daily. 11/16/16   Vaughan Basta, MD  budesonide-formoterol (SYMBICORT) 160-4.5 MCG/ACT inhaler Inhale 2 puffs into the lungs daily. 11/16/16   Vaughan Basta, MD  calcium acetate (PHOSLO) 667 MG capsule Take 2,001 mg by mouth 3 (three) times daily.    [provider]  ferrous sulfate 325 (65 FE) MG tablet Take 1 tablet by mouth 3 (three) times daily. 01/21/17   [provider]  furosemide (LASIX) 80 MG tablet Take 1 tablet (80 mg total) daily by mouth. 01/25/17   Demetrios Loll, MD  gabapentin (NEURONTIN) 300 MG capsule Take 1 capsule by mouth daily as directed 08/16/16   [provider]  ipratropium-albuterol (DUONEB) 0.5-2.5 (3) MG/3ML SOLN Take 3 mLs by nebulization every 6 (six) hours as needed. 11/28/16   Loletha Grayer, MD  labetalol (NORMODYNE) 100 MG tablet Take 100 mg by mouth daily. 03/24/17   [provider]  Multiple Vitamins-Minerals-FA (DIALYVITE SUPREME D) 3 MG TABS Take 1 tablet (3 mg total) by mouth daily. 11/28/16   Loletha Grayer, MD  nitroGLYCERIN (NITROSTAT) 0.4 MG SL tablet Place 1  tablet (0.4 mg total) under the tongue every 5 (five) minutes as needed. 04/12/16   Wende Bushy, MD  pantoprazole (PROTONIX) 40 MG tablet Take 1 tablet (40 mg total) by mouth daily. 11/28/16   Loletha Grayer, MD  Spacer/Aero Chamber Mouthpiece MISC 1 Units by Does not apply route every 4 (four) hours as needed (wheezing). 02/19/17   Darel Hong, MD  tiotropium (SPIRIVA HANDIHALER) 18 MCG inhalation capsule Place 1 capsule (18 mcg total) into inhaler and inhale daily. 11/16/16 11/16/17  Vaughan Basta, MD   No Known Allergies Review of Systems  Constitutional: Positive for appetite change and fatigue.  Respiratory: Positive for cough.   Musculoskeletal:       Leg pain     Physical Exam  Constitutional: He is cooperative. He appears ill.  Cardiovascular: Normal rate, regular rhythm and intact distal pulses.  Pulmonary/Chest: Effort normal.  Musculoskeletal:  Generalized weakness   Neurological: He is alert.  Skin:  Dialysis catheter intact   Psychiatric: His speech is normal. His affect is blunt. Cognition and memory are normal.  Nursing note and vitals reviewed.   Vital Signs: BP 117/77 (BP Location: Left Arm)   Pulse 79   Temp 98.4 F (36.9 C) (Oral)   Resp (!) 26   Ht '6\' 3"'$  (1.905 m)   Wt 69.5 kg (153 lb 3.5 oz)   SpO2 99%   BMI 19.15 kg/m  Pain Scale: 0-10 POSS *See Group Information*: 1-Acceptable,Awake and alert Pain Score: Asleep   SpO2: SpO2: 99 % O2 Device:SpO2: 99 % O2 Flow Rate: .   IO: Intake/output summary:   Intake/Output Summary (Last 24 hours) at 06/14/2017 1534 Last data filed  at 06/14/2017 1133 Gross per 24 hour  Intake 1326.25 ml  Output 400 ml  Net 926.25 ml    LBM: Last BM Date: 06/14/17 Baseline Weight: Weight: 70.8 kg (156 lb) Most recent weight: Weight: 69.5 kg (153 lb 3.5 oz)     Palliative Assessment/Data: PPS 50%   Time In: 1445 Time Out: 1555 Time Total: 70 min.   Greater than 50%  of this time was spent counseling and  coordinating care related to the above assessment and plan.  Signed by: Alda Lea, NP-BC Ihor Dow, NP-BC  Palliative Medicine Team  Phone: [phone] Fax: [phone]   Please contact Palliative Medicine Team phone at [phone] for questions and concerns.  For individual provider: See Shea Evans

## 2017-06-14 NOTE — Progress Notes (Signed)
Pre HD Tx    06/14/17 0842  Vital Signs  Temp 98.3 F (36.8 C)  Temp Source Oral  Pulse Rate 82  Pulse Rate Source Monitor  Resp 18  BP 99/61  BP Location Left Arm  BP Method Automatic  Patient Position (if appropriate) Lying  Oxygen Therapy  SpO2 100 %  O2 Device Room Air  Pain Assessment  Pain Scale 0-10  Pain Score 0  Dialysis Weight  Weight 69.5 kg (153 lb 3.5 oz)  Type of Weight Pre-Dialysis  Time-Out for Hemodialysis  What Procedure? HD  Pt Identifiers(min of two) First/Last Name;MRN/Account#  Correct Site? Yes  Correct Side? Yes  Correct Procedure? Yes  Consents Verified? Yes  Rad Studies Available? N/A  Safety Precautions Reviewed? Yes  Engineer, civil (consulting) Number [id number]  Station Number 3  UF/Alarm Test Passed  Conductivity: Meter 14  Conductivity: Machine  14  pH 7.4  Reverse Osmosis Main  Normal Saline Lot Number [id number]  Dialyzer Lot Number [id number]  Disposable Set Lot Number [id number]  Machine Temperature 98.6 F (37 C)  Musician and Audible Yes  Blood Lines Intact and Secured Yes  Pre Treatment Patient Checks  Vascular access used during treatment Catheter  Patient is receiving dialysis in a chair Yes  Hepatitis B Surface Antigen Results Negative  Date Hepatitis B Surface Antigen Drawn 03/01/17  Hepatitis B Surface Antibody 10  Date Hepatitis B Surface Antibody Drawn 03/01/17  Hemodialysis Consent Verified Yes  Hemodialysis Standing Orders Initiated Yes  ECG (Telemetry) Monitor On Yes  Prime Ordered Normal Saline  Length of  DialysisTreatment -hour(s) 3 Hour(s)  Dialysis Treatment Comments Na 140  Dialyzer Elisio 17H NR  Dialysate 2K, 2.5 Ca  Dialysis Anticoagulant None  Dialysate Flow Ordered 800  Blood Flow Rate Ordered 400 mL/min  Ultrafiltration Goal 0 Liters  Dialysis Blood Pressure Support Ordered Normal Saline  Hemodialysis Catheter Right Internal jugular Double-lumen  No Placement Date or Time found.   Placed prior to  admission: Yes  Orientation: Right  Access Location: Internal jugular  Hemodialysis Catheter Type: Double-lumen  Site Condition No complications  Blue Lumen Status Flushed  Red Lumen Status Flushed  Purple Lumen Status N/A  Dressing Type Biopatch;Occlusive  Dressing Status Clean;Dry;Intact

## 2017-06-15 ENCOUNTER — Inpatient Hospital Stay: Payer: Medicare Other | Admitting: Certified Registered Nurse Anesthetist

## 2017-06-15 ENCOUNTER — Encounter
Admission: EM | Disposition: A | Discharge status: Home / Self Care | Payer: Self-pay | Source: Home / Self Care | Attending: Family Medicine

## 2017-06-15 ENCOUNTER — Encounter: Payer: Self-pay | Admitting: *Deleted

## 2017-06-15 ENCOUNTER — Inpatient Hospital Stay: Payer: Medicare Other | Admitting: Anesthesiology

## 2017-06-15 DIAGNOSIS — K2961 Other gastritis with bleeding: Secondary | ICD-10-CM

## 2017-06-15 HISTORY — PX: ENTEROSCOPY: SHX5533

## 2017-06-15 HISTORY — PX: ESOPHAGOGASTRODUODENOSCOPY (EGD) WITH PROPOFOL: SHX5813

## 2017-06-15 LAB — CBC
HCT: 20.9 % — ABNORMAL LOW (ref 40.0–52.0)
HEMOGLOBIN: 6.9 g/dL — AB (ref 13.0–18.0)
MCH: 29.6 pg (ref 26.0–34.0)
MCHC: 33.1 g/dL (ref 32.0–36.0)
MCV: 89.4 fL (ref 80.0–100.0)
PLATELETS: 304 10*3/uL (ref 150–440)
RBC: 2.34 MIL/uL — AB (ref 4.40–5.90)
RDW: 16.6 % — ABNORMAL HIGH (ref 11.5–14.5)
WBC: 10.5 10*3/uL (ref 3.8–10.6)

## 2017-06-15 LAB — BASIC METABOLIC PANEL
ANION GAP: 9 (ref 5–15)
BUN: 33 mg/dL — AB (ref 6–20)
CHLORIDE: 101 mmol/L (ref 101–111)
CO2: 29 mmol/L (ref 22–32)
Calcium: 7 mg/dL — ABNORMAL LOW (ref 8.9–10.3)
Creatinine, Ser: 4.39 mg/dL — ABNORMAL HIGH (ref 0.61–1.24)
GFR calc Af Amer: 16 mL/min — ABNORMAL LOW (ref >60)
GFR, EST NON AFRICAN AMERICAN: 14 mL/min — AB (ref >60)
Glucose, Bld: 129 mg/dL — ABNORMAL HIGH (ref 65–99)
POTASSIUM: 4.4 mmol/L (ref 3.5–5.1)
SODIUM: 139 mmol/L (ref 135–145)

## 2017-06-15 LAB — PREPARE RBC (CROSSMATCH)

## 2017-06-15 LAB — HEMOGLOBIN AND HEMATOCRIT, BLOOD
HEMATOCRIT: 21.8 % — AB (ref 40.0–52.0)
HEMOGLOBIN: 7 g/dL — AB (ref 13.0–18.0)

## 2017-06-15 SURGERY — ENTEROSCOPY
Anesthesia: General

## 2017-06-15 SURGERY — ESOPHAGOGASTRODUODENOSCOPY (EGD) WITH PROPOFOL
Anesthesia: General

## 2017-06-15 MED ORDER — PHENYLEPHRINE HCL 10 MG/ML IJ SOLN
INTRAMUSCULAR | Status: DC | PRN
  Administered 2017-06-15: 200 ug via INTRAVENOUS
  Administered 2017-06-15: 100 ug via INTRAVENOUS

## 2017-06-15 MED ORDER — PANTOPRAZOLE SODIUM 40 MG PO TBEC
40.0000 mg | DELAYED_RELEASE_TABLET | Freq: Two times a day (BID) | ORAL | Status: DC
  Administered 2017-06-15 – 2017-06-16 (×2): 40 mg via ORAL
  Filled 2017-06-15 (×2): qty 1

## 2017-06-15 MED ORDER — SODIUM CHLORIDE 0.9% FLUSH: 3.0000 mL | INTRAVENOUS | Status: DC | PRN

## 2017-06-15 MED ORDER — LIDOCAINE HCL (PF) 2 % IJ SOLN
INTRAMUSCULAR | Status: AC
  Filled 2017-06-15: qty 10

## 2017-06-15 MED ORDER — FENTANYL CITRATE (PF) 100 MCG/2ML IJ SOLN
INTRAMUSCULAR | Status: DC | PRN
  Administered 2017-06-15: 50 ug via INTRAVENOUS

## 2017-06-15 MED ORDER — FENTANYL CITRATE (PF) 100 MCG/2ML IJ SOLN
INTRAMUSCULAR | Status: AC
  Filled 2017-06-15: qty 2

## 2017-06-15 MED ORDER — PHENOL 1.4 % MT LIQD
1.0000 | OROMUCOSAL | Status: DC | PRN
  Administered 2017-06-15: 1 via OROMUCOSAL
  Filled 2017-06-15: qty 177

## 2017-06-15 MED ORDER — SODIUM CHLORIDE 0.9 % IV SOLN
INTRAVENOUS | Status: DC
  Administered 2017-06-15: 11:00:00 via INTRAVENOUS

## 2017-06-15 MED ORDER — ONDANSETRON HCL 4 MG/2ML IJ SOLN
INTRAMUSCULAR | Status: DC | PRN
  Administered 2017-06-15: 4 mg via INTRAVENOUS

## 2017-06-15 MED ORDER — LIDOCAINE HCL (CARDIAC) 20 MG/ML IV SOLN
INTRAVENOUS | Status: DC | PRN
  Administered 2017-06-15: 50 mg via INTRAVENOUS

## 2017-06-15 MED ORDER — SODIUM CHLORIDE 0.9% FLUSH
3.0000 mL | Freq: Two times a day (BID) | INTRAVENOUS | Status: DC
  Administered 2017-06-15: 3 mL via INTRAVENOUS

## 2017-06-15 MED ORDER — OXYMETAZOLINE HCL 0.05 % NA SOLN
NASAL | Status: AC
  Filled 2017-06-15: qty 15

## 2017-06-15 MED ORDER — PROPOFOL 500 MG/50ML IV EMUL
INTRAVENOUS | Status: AC
  Filled 2017-06-15: qty 50

## 2017-06-15 MED ORDER — SUCCINYLCHOLINE CHLORIDE 20 MG/ML IJ SOLN
INTRAMUSCULAR | Status: DC | PRN
  Administered 2017-06-15: 140 mg via INTRAVENOUS

## 2017-06-15 MED ORDER — SUCCINYLCHOLINE CHLORIDE 20 MG/ML IJ SOLN
INTRAMUSCULAR | Status: AC
  Filled 2017-06-15: qty 1

## 2017-06-15 MED ORDER — PROPOFOL 500 MG/50ML IV EMUL
INTRAVENOUS | Status: DC | PRN
  Administered 2017-06-15: 140 ug/kg/min via INTRAVENOUS

## 2017-06-15 MED ORDER — PROPOFOL 10 MG/ML IV BOLUS
INTRAVENOUS | Status: AC
  Filled 2017-06-15: qty 20

## 2017-06-15 MED ORDER — PROPOFOL 10 MG/ML IV BOLUS
INTRAVENOUS | Status: DC | PRN
  Administered 2017-06-15: 50 mg via INTRAVENOUS
  Administered 2017-06-15: 20 mg via INTRAVENOUS
  Administered 2017-06-15: 80 mg via INTRAVENOUS

## 2017-06-15 MED ORDER — PROPOFOL 10 MG/ML IV BOLUS
INTRAVENOUS | Status: DC | PRN
  Administered 2017-06-15: 160 mg via INTRAVENOUS

## 2017-06-15 MED ORDER — ONDANSETRON HCL 4 MG/2ML IJ SOLN
INTRAMUSCULAR | Status: AC
  Filled 2017-06-15: qty 2

## 2017-06-15 NOTE — Op Note (Signed)
..  06/15/2017  4:56 PM    [name]  [id number]   Pre-Op Dx:  Nasal Hemorrhage, Foreign body nasal cavity  Post-op Dx: Same  Proc:   1)  Endoscopic Control of Nasal Hemorrhage  2)  Removal of foreign body of nose  Surg:  [place]  Anes:  General  EBL:  20  Comp:  None  Findings: Called urgently to Endoscopy to evaluate for nasal bleeding.  Repeated episodes of bleeding from oral cavity and nasal cavity upon attempted extubation.  Small area of bleeding of left inferior turbinate and lateral nasal side wall cauterized with silver nitrate.  Extensive food debris in nasopharynx.  No posterior or right sided evidence of active bleeding.  Foreign body removed from nasal cavity and nasopharynx.  Severe s-shaped septal deviation.   Procedure: After the patient was identified in Endoscopy, topical Afrin was sprayed into the patient's nasal cavity bilaterally.  A flexible endoscope was inserted into the patient's right nasal cavity.  This demonstrated old blood and foreign body consistent with food particles throughout nasal cavity and nasopharynx.  Evaluation on left demonstrated similar findings except for larger amounts of foreign body consistent with food debris.  AT this time a zero degree endoscope was brought onto the field and with straight suction, multiple pieces of food were removed in a piecemeal fashion.  A small area of bleeding on anterior inferior turbinate and lateral nasal sidewall was cauterized with silver nitrate under endoscopic guidance.   Further removal of foreign body consistent with food was removed from the patient's nasopharynx and oropharynx with straight suction and curved suction.  No additional sites of bleeding were noted.  Care of the patient at this time was transferred to [facility] and the patient was extubated and returned to medicine for treatment.   Dispo:   Back to Telemetry floor  Rainah Kirshner 06/15/2017 4:56 PM

## 2017-06-15 NOTE — Anesthesia Post-op Follow-up Note (Signed)
Anesthesia QCDR form completed.        

## 2017-06-15 NOTE — OR Nursing (Signed)
Dr Pryor Ochoa completed procedure in ENDO.  Large amount of foreign material/ food removed by Dr Pryor Ochoa with no new/ old bleeding noted at this time.  CRNA to extubate patient when appropriate and patient will be taken to PACU for recovery prior to returning to inpatient unit.

## 2017-06-15 NOTE — Transfer of Care (Signed)
Immediate Anesthesia Transfer of Care Note  Patient: Stanley [name]  Procedure(s) Performed: ESOPHAGOGASTRODUODENOSCOPY (EGD) WITH PROPOFOL (N/A )  Patient Location: PACU  Anesthesia Type:General  Level of Consciousness: awake, alert , oriented and patient cooperative  Airway & Oxygen Therapy: Patient Spontanous Breathing and Patient connected to nasal cannula oxygen  Post-op Assessment: Report given to RN and Post -op Vital signs reviewed and stable  Post vital signs: Reviewed and stable  Last Vitals:  Vitals Value Taken Time  BP 108/68 06/15/2017  2:31 PM  Temp 36.8 C 06/15/2017  2:37 PM  Pulse 88 06/15/2017  2:40 PM  Resp 16 06/15/2017  2:40 PM  SpO2 95 % 06/15/2017  2:40 PM  Vitals shown include unvalidated device data.  Last Pain:  Vitals:   06/15/17 1437  TempSrc:   PainSc: 0-No pain      Patients Stated Pain Goal: 3 (14/70/92 9574)  Complications: No apparent anesthesia complications

## 2017-06-15 NOTE — Transfer of Care (Signed)
Immediate Anesthesia Transfer of Care Note  Patient: Stanley [name]  Procedure(s) Performed: Push ENTEROSCOPY (N/A )  Patient Location: Endoscopy Unit  Anesthesia Type:General  Level of Consciousness: drowsy  Airway & Oxygen Therapy: Patient Spontanous Breathing and Patient connected to nasal cannula oxygen  Post-op Assessment: Report given to RN and Post -op Vital signs reviewed and stable  Post vital signs: Reviewed and stable  Last Vitals:  Vitals Value Taken Time  BP 140/118 06/15/2017 12:18 PM  Temp 36.8 C 06/15/2017 12:18 PM  Pulse 82 06/15/2017 12:18 PM  Resp 18 06/15/2017 12:18 PM  SpO2 100 % 06/15/2017 12:18 PM    Last Pain:  Vitals:   06/15/17 1218  TempSrc:   PainSc: Asleep      Patients Stated Pain Goal: 3 (97/98/92 1194)  Complications: Coughing bright red blood from mouth and nose. Drs. [place] and [place] notified. Decision made to bring patient back to procedure room, intubate, and reassess.

## 2017-06-15 NOTE — Discharge Instructions (Signed)
Paracentesis, Care After °Refer to this sheet in the next few weeks. These instructions provide you with information about caring for yourself after your procedure. Your health care provider may also give you more specific instructions. Your treatment has been planned according to current medical practices, but problems sometimes occur. Call your health care provider if you have any problems or questions after your procedure. °What can I expect after the procedure? °After your procedure, it is common to have a small amount of clear fluid coming from the puncture site. °Follow these instructions at home: °· Return to your normal activities as told by your health care provider. Ask your health care provider what activities are safe for you. °· Take over-the-counter and prescription medicines only as told by your health care provider. °· Do not take baths, swim, or use a hot tub until your health care provider approves. °· Follow instructions from your health care provider about: °? How to take care of your puncture site. °? When and how you should change your bandage (dressing). °? When you should remove your dressing. °· Check your puncture area every day signs of infection. Watch for: °? Redness, swelling, or pain. °? Fluid, blood, or pus. °· Keep all follow-up visits as told by your health care provider. This is important. °Contact a health care provider if: °· You have redness, swelling, or pain at your puncture site. °· You start to have more clear fluid coming from your puncture site. °· You have blood or pus coming from your puncture site. °· You have chills. °· You have a fever. °Get help right away if: °· You develop chest pain or shortness of breath. °· You develop increasing pain, discomfort, or swelling in your abdomen. °· You feel dizzy or light-headed or you pass out. °This information is not intended to replace advice given to you by your health care provider. Make sure you discuss any questions you  have with your health care provider. °Document Released: 07/15/2014 Document Revised: 08/06/2015 Document Reviewed: 05/13/2014 °Elsevier Interactive Patient Education © 2018 [employer] ° °

## 2017-06-15 NOTE — Care Management (Signed)
Coughed up blood after EGD (no active bleeding was found.) Appears blood actually coming from phyranx. Palliative has consulted and patient is declining services

## 2017-06-15 NOTE — Anesthesia Preprocedure Evaluation (Signed)
Anesthesia Evaluation  Patient identified by MRN, date of birth, ID band Patient awake    Reviewed: Allergy & Precautions, H&P , NPO status , reviewed documented beta blocker date and time   History of Anesthesia Complications Negative for: history of anesthetic complications  Airway Mallampati: II  TM Distance: >3 FB     Dental  (+) Poor Dentition, Missing   Pulmonary shortness of breath, COPD, Current Smoker,    Pulmonary exam normal        Cardiovascular hypertension, + CAD, + Peripheral Vascular Disease and +CHF  Normal cardiovascular exam     Neuro/Psych PSYCHIATRIC DISORDERS Anxiety Diabetic neuropathy  Neuromuscular disease    GI/Hepatic PUD, GERD  ,(+) Hepatitis -  Endo/Other  diabetes  Renal/GU DialysisRenal disease     Musculoskeletal   Abdominal   Peds  Hematology  (+) anemia ,   Anesthesia Other Findings Fracture, mandible (HCC) Coronary atherosclerosis of native coronary artery ESRD on dialysis ([place]) Mandible open fracture ([place]) Mandible fracture (HCC) Hyperkalemia Homicidal ideation Suicidal intent Homicidal ideations GIB (gastrointestinal bleeding) HTN (hypertension) GERD (gastroesophageal reflux disease) HLD (hyperlipidemia) Dyspnea Cirrhosis of liver with ascites (HCC) Ascites Symptomatic anemia GI bleed Acute GI bleeding Blood in stool Angiodysplasia of stomach and duodenum with hemorrhage Gastritis Esophagitis, unspecified Upper GI bleed Acute posthemorrhagic anemia Gastrointestinal bleed History of esophagogastroduodenoscopy (EGD) Elevated troponin Alcohol abuse Abdominal pain Anemia due to chronic blood loss Melena Small bowel bleed not requiring more than 4 units of blood in 24 hours, ICU, or surgery Angiodysplasia of small intestine Chronic renal failure Ischemic heart disease Anemia of chronic disease MRSA carrier Rectal bleeding GI bleeding Acute respiratory failure  with hypoxia (HCC) Anemia Heme positive stool Ulceration of intestine Benign neoplasm of transverse colon Acute gastrointestinal hemorrhage Esophageal candidiasis (HCC) Angiodysplasia of intestinal tract COPD exacerbation (HCC) COPD (chronic obstructive pulmonary disease) (HCC) Shortness of breath Hypocalcemia Weakness Hemodialysis patient ([place]) Uremia ESRD (end stage renal disease) ([place]) Alcoholic hepatitis with ascites  Reproductive/Obstetrics                             Anesthesia Physical  Anesthesia Plan  ASA: IV  Anesthesia Plan: General   Post-op Pain Management:    Induction:   PONV Risk Score and Plan: 2 and Propofol infusion and TIVA  Airway Management Planned:   Additional Equipment:   Intra-op Plan:   Post-operative Plan:   Informed Consent: I have reviewed the patients History and Physical, chart, labs and discussed the procedure including the risks, benefits and alternatives for the proposed anesthesia with the patient or authorized representative who has indicated his/her understanding and acceptance.   Dental Advisory Given  Plan Discussed with: CRNA  Anesthesia Plan Comments:         Anesthesia Quick Evaluation

## 2017-06-15 NOTE — Progress Notes (Signed)
Daily Progress Note   Patient Name: Stanley [name]       Date: 06/15/2017 DOB: 01/20/1961  Age: 57 y.o. MRN#: [id number] Attending Physician: Vaughan Basta, * Primary Care Physician: [name], MD Admit Date: 06/12/2017  Reason for Consultation/Follow-up: Establishing goals of care and Psychosocial/spiritual support  Subjective: Patient recently returned to room from EGD. Sitting up in bed eating Jell-O. Denies any pain at this time. Verbalizes he hopes everything went ok with his test because he is about ready to go. Reports he has no needs at this time. Informed patient I was unable to get in touch with his sister to discuss our conversations on yesterday. He verbalizes talking to her on last night and informed her of our meeting. States she had our numbers if she needed anything. Offered again for Palliative services to follow him in the home, however, he continues to refuse, stating he didn't want anyone coming into his home and he may not answer phone. Verbalized if at any point during his hospitalization or in the home setting he was interested in services he could let his primary provider or nephrologist know and they could make a referral. He verbalized understanding. Did not maintain much eye contact. Thanked me for my services and the end of conversation.   Chart Reviewed and report received from bedside RN.   Length of Stay: 3  Current Medications: Scheduled Meds:  . acetaminophen  1,000 mg Oral Once  . amitriptyline  50 mg Oral QHS  . budesonide (PULMICORT) nebulizer solution  0.5 mg Nebulization BID  . feeding supplement  1 Container Oral TID BM  . furosemide  80 mg Intravenous BID  . multivitamin  1 tablet Oral QHS  . multivitamin with minerals  1 tablet Oral Once  per day on Mon Wed Fri  . nicotine  7 mg Transdermal Daily  . oxymetazoline      . pantoprazole  40 mg Oral BID  . tiotropium  18 mcg Inhalation Daily  . vitamin C  500 mg Oral BID    Continuous Infusions: . sodium chloride      PRN Meds: acetaminophen, albuterol, gabapentin, guaiFENesin-dextromethorphan, nitroGLYCERIN, ondansetron **OR** ondansetron (ZOFRAN) IV, sodium chloride, traMADol  Physical Exam  Constitutional: He is oriented to person, place, and time. He is cooperative. He has a sickly  appearance.  Cardiovascular: Normal rate and regular rhythm.  Pulmonary/Chest: Effort normal.  Musculoskeletal:  Generalized weakness   Neurological: He is alert and oriented to person, place, and time.  Nursing note and vitals reviewed.           Vital Signs: BP 103/65   Pulse 88   Temp 97.9 F (36.6 C)   Resp 18   Ht 6\' 3"  (1.905 m)   Wt 70.8 kg (156 lb)   SpO2 92%   BMI 19.50 kg/m  SpO2: SpO2: 92 % O2 Device: O2 Device: Room Air O2 Flow Rate: O2 Flow Rate (L/min): 2 L/min  Intake/output summary:   Intake/Output Summary (Last 24 hours) at 06/15/2017 1558 Last data filed at 06/15/2017 1424 Gross per 24 hour  Intake 1570 ml  Output 450 ml  Net 1120 ml   LBM: Last BM Date: 06/14/17 Baseline Weight: Weight: 70.8 kg (156 lb) Most recent weight: Weight: 70.8 kg (156 lb)       Palliative Assessment/Data:PPS 50%    Flowsheet Rows     Most Recent Value  Intake Tab  Referral Department  Hospitalist  Unit at Time of Referral  Med/Surg Unit  Palliative Care Primary Diagnosis  Nephrology  Date Notified  06/14/17  Palliative Care Type  New Palliative care  Reason for referral  Clarify Goals of Care  Date of Admission  06/12/17  Date first seen by Palliative Care  06/14/17  # of days Palliative referral response time  0 Day(s)  # of days IP prior to Palliative referral  2  Clinical Assessment  Psychosocial & Spiritual Assessment  Palliative Care Outcomes       Patient Active Problem List   Diagnosis Date Noted  . Protein-calorie malnutrition, severe 06/14/2017  . Encounter for dialysis [facility])   . Palliative care by specialist   . Goals of care, counseling/discussion   . Malnutrition of moderate degree 06/05/2017  . Secondary esophageal varices without bleeding ([place])   . Stomach irritation   . Idiopathic esophageal varices without bleeding ([place])   . Alcoholic hepatitis with ascites 05/24/2017  . ESRD (end stage renal disease) ([place]) 04/28/2017  . Uremia 03/08/2017  . ESRD on hemodialysis ([place]) 03/03/2017  . Weakness 02/28/2017  . Hypocalcemia 02/22/2017  . Shortness of breath 11/26/2016  . COPD (chronic obstructive pulmonary disease) ([place]) 10/30/2016  . COPD exacerbation ([place]) 10/29/2016  . Anemia   . Heme positive stool   . Ulceration of intestine   . Benign neoplasm of transverse colon   . Acute gastrointestinal hemorrhage   . Esophageal candidiasis ([place])   . Angiodysplasia of intestinal tract   . Acute respiratory failure with hypoxia ([place]) 07/03/2016  . GI bleeding 06/24/2016  . Rectal bleeding 06/14/2016  . Anemia of chronic disease 06/01/2016  . MRSA carrier 06/01/2016  . Chronic renal failure 05/23/2016  . Ischemic heart disease 05/23/2016  . Angiodysplasia of small intestine   . Melena   . Small bowel bleed not requiring more than 4 units of blood in 24 hours, ICU, or surgery   . Anemia due to chronic blood loss   . Abdominal pain 05/05/2016  . Acute posthemorrhagic anemia 04/17/2016  . Gastrointestinal bleed 04/17/2016  . History of esophagogastroduodenoscopy (EGD) 04/17/2016  . Elevated troponin 04/17/2016  . Alcohol abuse 04/17/2016  . Upper GI bleed 01/19/2016  . Blood in stool   . Angiodysplasia of stomach and duodenum with hemorrhage   . Gastritis   . Reflux esophagitis   .  GI bleed 05/16/2015  . Acute GI bleeding   . Symptomatic anemia 04/30/2015  . HTN (hypertension) 04/06/2015  . GERD  (gastroesophageal reflux disease) 04/06/2015  . HLD (hyperlipidemia) 04/06/2015  . Dyspnea 04/06/2015  . Cirrhosis of liver with ascites ([place]) 04/06/2015  . Ascites 04/06/2015  . GIB (gastrointestinal bleeding) 03/23/2015  . Homicidal ideation 06/19/2013  . Suicidal intent 06/19/2013  . Homicidal ideations 06/19/2013  . Hyperkalemia 06/16/2013  . Mandible fracture ([place]) 06/05/2013  . Fracture, mandible ([place]) 06/02/2013  . Coronary atherosclerosis of native coronary artery 06/02/2013  . ESRD on dialysis ([place]) 06/02/2013  . Mandible open fracture [facility]) 06/02/2013    Palliative Care Assessment & Plan   Patient Profile: 57 y.o. male admitted on 06/12/2017 from home with complaints of shortness of breath and abdominal pain associated with rectal bleeding and hematemesis. He has a past medical history significant for end-state renal disease (HD M,W,F), CHF, cirrhosis, coronary artery disease, diabetic peripheral neuropathy, drug abuse, alcohol abuse, hyperlipidemia, HTN, GERD, anemia, GI bleed, COPD, and triple vessel bypass (2009). He currently comes into the ED to receive dialysis as he is not able to receive dialysis in the outpatient setting due to behavior. He has a longstanding history of medical noncompliance with multiple admissions resulting in leaving AMA. He received dialysis on 4/1 and 4/3 without complications. GI has been consulted due to acute melena GI bleed. He has been seen by multiple Gastroenterologist which requested him to follow up with [facility] for further work up. Patient is s/p 2 units PRBC and per GI patient will undergo endoscopy within the next 24 hours. Palliative Medicine team consulted for goals of care discussion in relation to frequent hospitalizations and longstanding medical noncompliance despite severity of health status.  Recommendations/Plan:  FULL CODE/FULL SCOPE per patient's request   Continue to treat the treatable   Patient would benefit from palliative  services outpatient, however, he continues to refuse. He is aware if he changes his mind to notify RN or provider.   Palliative will continue to support patient, family, and medical team during hospitalization as needed. Please call.   Goals of Care and Additional Recommendations:  Limitations on Scope of Treatment: Full Scope Treatment at patient's request.   Code Status:    Code Status Orders  (From admission, onward)        Start     Ordered   06/12/17 1707  Full code  Continuous     06/12/17 1706     Prognosis:  Unable to determine-Guarded to poor in the setting of end-stage renal disease, CHF, COPD, cirrhosis, recurrent GI bleed, anemia, HD, malnutrition, longstanding medical noncompliance   Discharge Planning: To Be Determined -Patient's goal is to return home at discharge. Currently refusing Palliative or any in home services.   Care plan was discussed with patient, Dr. Juleen China, and bedside RN.   Thank you for allowing the Palliative Medicine Team to assist in the care of this patient.   Total Time 25 min.  Prolonged Time Billed  NO       Greater than 50%  of this time was spent counseling and coordinating care related to the above assessment and plan.  Alda Lea, NP-BC Ihor Dow, NP-BC Palliative Medicine Team  Phone: [phone] Fax: [phone]  Please contact Palliative Medicine Team phone at [phone] for questions and concerns.

## 2017-06-15 NOTE — Anesthesia Procedure Notes (Signed)
Procedure Name: Intubation Date/Time: 06/15/2017 12:37 PM Performed by: Eben Burow, CRNA Pre-anesthesia Checklist: Patient identified, Emergency Drugs available, Suction available, Patient being monitored and Timeout performed Patient Re-evaluated:Patient Re-evaluated prior to induction Oxygen Delivery Method: Circle system utilized Preoxygenation: Pre-oxygenation with 100% oxygen Induction Type: IV induction, Rapid sequence and Cricoid Pressure applied Laryngoscope Size: Mac and 4 Grade View: Grade I Tube type: Oral Tube size: 7.5 mm Number of attempts: 1 Airway Equipment and Method: Stylet Placement Confirmation: ETT inserted through vocal cords under direct vision,  breath sounds checked- equal and bilateral and positive ETCO2 Secured at: 22 cm Tube secured with: Tape Dental Injury: Teeth and Oropharynx as per pre-operative assessment

## 2017-06-15 NOTE — OR Nursing (Signed)
Patient returned to ENDO procedure room after experiencing bright red bleeding from mouth and nose after Push Enteroscopy by Dr Allen Norris previously.  Dr Allen Norris to perform Upper Endoscopy for evaluation of bright red bleeding/ blood from mouth and nose.

## 2017-06-15 NOTE — Progress Notes (Signed)
Pt taken back to Endoscopy Room for another procedure to evaluate the active bleeding when coughing. Pt is calm and appropriate. VS and orders assessed and will continue to monitor and tx pt according to MD orders.

## 2017-06-15 NOTE — Anesthesia Preprocedure Evaluation (Signed)
Anesthesia Evaluation  Patient identified by MRN, date of birth, ID band Patient awake    Reviewed: Allergy & Precautions, H&P , NPO status , reviewed documented beta blocker date and time   History of Anesthesia Complications Negative for: history of anesthetic complications  Airway Mallampati: II  TM Distance: >3 FB     Dental  (+) Poor Dentition, Missing   Pulmonary shortness of breath, COPD, Current Smoker,    Pulmonary exam normal        Cardiovascular hypertension, + CAD, + Peripheral Vascular Disease and +CHF  Normal cardiovascular exam     Neuro/Psych PSYCHIATRIC DISORDERS Anxiety Diabetic neuropathy  Neuromuscular disease    GI/Hepatic PUD, GERD  ,(+) Hepatitis -  Endo/Other  diabetes  Renal/GU DialysisRenal disease     Musculoskeletal   Abdominal   Peds  Hematology  (+) anemia ,   Anesthesia Other Findings Fracture, mandible (HCC) Coronary atherosclerosis of native coronary artery ESRD on dialysis ([place]) Mandible open fracture ([place]) Mandible fracture (HCC) Hyperkalemia Homicidal ideation Suicidal intent Homicidal ideations GIB (gastrointestinal bleeding) HTN (hypertension) GERD (gastroesophageal reflux disease) HLD (hyperlipidemia) Dyspnea Cirrhosis of liver with ascites (HCC) Ascites Symptomatic anemia GI bleed Acute GI bleeding Blood in stool Angiodysplasia of stomach and duodenum with hemorrhage Gastritis Esophagitis, unspecified Upper GI bleed Acute posthemorrhagic anemia Gastrointestinal bleed History of esophagogastroduodenoscopy (EGD) Elevated troponin Alcohol abuse Abdominal pain Anemia due to chronic blood loss Melena Small bowel bleed not requiring more than 4 units of blood in 24 hours, ICU, or surgery Angiodysplasia of small intestine Chronic renal failure Ischemic heart disease Anemia of chronic disease MRSA carrier Rectal bleeding GI bleeding Acute respiratory failure  with hypoxia (HCC) Anemia Heme positive stool Ulceration of intestine Benign neoplasm of transverse colon Acute gastrointestinal hemorrhage Esophageal candidiasis (HCC) Angiodysplasia of intestinal tract COPD exacerbation (HCC) COPD (chronic obstructive pulmonary disease) (HCC) Shortness of breath Hypocalcemia Weakness Hemodialysis patient ([place]) Uremia ESRD (end stage renal disease) ([place]) Alcoholic hepatitis with ascites  Reproductive/Obstetrics                             Anesthesia Physical  Anesthesia Plan  ASA: IV and emergent  Anesthesia Plan: General   Post-op Pain Management:    Induction:   PONV Risk Score and Plan: 2 and Propofol infusion and TIVA  Airway Management Planned: Oral ETT  Additional Equipment:   Intra-op Plan:   Post-operative Plan:   Informed Consent: I have reviewed the patients History and Physical, chart, labs and discussed the procedure including the risks, benefits and alternatives for the proposed anesthesia with the patient or authorized representative who has indicated his/her understanding and acceptance.   Dental Advisory Given  Plan Discussed with: CRNA  Anesthesia Plan Comments: (Immediately post-op, pt begins to cough up blood. Pt still sedated from procedure. Will need emergent repeat EGD.)        Anesthesia Quick Evaluation

## 2017-06-15 NOTE — Op Note (Signed)
[facility] Gastroenterology Patient Name: Stanley [name] Procedure Date: 06/15/2017 12:39 PM MRN: [id number] Account #: [id number] Date of Birth: [birth year] Admit Type: Inpatient Age: 57 Room: [facility] ENDO ROOM 4 Gender: Male Note Status: Finalized Procedure:            Upper GI endoscopy Indications:          Hematemesis Providers:            [name] MD, MD Medicines:            General Anesthesia Complications:        No immediate complications. Procedure:            Pre-Anesthesia Assessment:                       - Prior to the procedure, a History and Physical was                        performed, and patient medications and allergies were                        reviewed. The patient's tolerance of previous                        anesthesia was also reviewed. The risks and benefits of                        the procedure and the sedation options and risks were                        discussed with the patient. All questions were                        answered, and informed consent was obtained. Prior                        Anticoagulants: The patient has taken no previous                        anticoagulant or antiplatelet agents. ASA Grade                        Assessment: II - A patient with mild systemic disease.                        After reviewing the risks and benefits, the patient was                        deemed in satisfactory condition to undergo the                        procedure.                       After obtaining informed consent, the endoscope was                        passed under direct vision. Throughout the procedure,                        the patient's blood pressure, pulse, and oxygen  saturations were monitored continuously. The Endoscope                        was introduced through the mouth, and advanced to the                        antrum of the stomach. The upper GI endoscopy was         accomplished without difficulty. The patient tolerated                        the procedure well. Findings:      The partient was brought back to the endoscopy room due to coughing up       blood after the procedure.      After looking around the stomach and esophagus it appears the blood is       coming down the phyranx from the nose. Impression:           - The partient was brought back to the endoscopy room                        due to coughing up blood after the procedure.                       After looking around the stomach and esophagus it                        appears the blood is coming down the phyranx from the                        nose.                       - No specimens collected. Recommendation:       - Return patient to hospital ward for ongoing care. Procedure Code(s):    --- Professional ---                       [phone], 52, Esophagogastroduodenoscopy, flexible,                        transoral; diagnostic, including collection of                        specimen(s) by brushing or washing, when performed                        (separate procedure) Diagnosis Code(s):    --- Professional ---                       K92.0, Hematemesis CPT copyright 2017 American Medical Association. All rights reserved. The codes documented in this report are preliminary and upon coder review may  be revised to meet current compliance requirements. [name] MD, MD 06/15/2017 12:49:44 PM This report has been signed electronically. Number of Addenda: 0 Note Initiated On: 06/15/2017 12:39 PM      [facility]

## 2017-06-15 NOTE — Anesthesia Postprocedure Evaluation (Signed)
Anesthesia Post Note  Patient: Stanley [name]  Procedure(s) Performed: Push ENTEROSCOPY (N/A )  Patient location during evaluation: Endoscopy Anesthesia Type: General Level of consciousness: awake and alert Pain management: pain level controlled Vital Signs Assessment: post-procedure vital signs reviewed and stable Respiratory status: spontaneous breathing and respiratory function stable Cardiovascular status: stable Anesthetic complications: no     Last Vitals:  Vitals:   06/15/17 1108 06/15/17 1218  BP: 114/75 (!) 140/118  Pulse: 78 82  Resp: 18 18  Temp: 36.8 C 36.8 C  SpO2: 100% 100%    Last Pain:  Vitals:   06/15/17 1218  TempSrc:   PainSc: Asleep                 KEPHART,WILLIAM K

## 2017-06-15 NOTE — OR Nursing (Signed)
CRNA suctioning patient prior to extubation following EGD and it was noted that there was blood and foreign material being suctioned from patient oropharynx.  Dr Allen Norris and Dr Ronelle Nigh in to evaluate.  EGD scope again passed into esophagus and stomach with no bleeding noted in either.  ENT services to be notified regarding possible consult.  Patient remains intubated in ENDO room 4 and will await further instructions per Dr Ronelle Nigh and Dr Allen Norris prior to transferring to PACU for recovery.

## 2017-06-15 NOTE — Op Note (Addendum)
[facility] Gastroenterology Patient Name: Stanley [name] Procedure Date: 06/15/2017 11:48 AM MRN: [id number] Account #: [id number] Date of Birth: [birth year] Admit Type: Inpatient Age: 57 Room: [facility] ENDO ROOM 4 Gender: Male Note Status: Finalized Procedure:            Small bowel enteroscopy Indications:          Recurrent bleeding in the small bowel Providers:            [name] MD, MD Referring MD:         No Local Md, MD (Referring MD) Medicines:            Propofol per Anesthesia Complications:        No immediate complications. Procedure:            Pre-Anesthesia Assessment:                       - Prior to the procedure, a History and Physical was                        performed, and patient medications and allergies were                        reviewed. The patient's tolerance of previous                        anesthesia was also reviewed. The risks and benefits of                        the procedure and the sedation options and risks were                        discussed with the patient. All questions were                        answered, and informed consent was obtained. Prior                        Anticoagulants: The patient has taken no previous                        anticoagulant or antiplatelet agents. ASA Grade                        Assessment: III - A patient with severe systemic                        disease. After reviewing the risks and benefits, the                        patient was deemed in satisfactory condition to undergo                        the procedure.                       After obtaining informed consent, the endoscope was                        passed under direct vision. Throughout the procedure,  the patient's blood pressure, pulse, and oxygen                        saturations were monitored continuously. The Endoscope                        was introduced through the mouth and advanced to the                         jejunum, to the 110 cm mark (from the incisors). The                        small bowel enteroscopy was accomplished without                        difficulty. The patient tolerated the procedure well. Findings:      A large amount of food (residue) was found in the entire examined       stomach.      There was no evidence of significant pathology in the jejunum. Impression:           - A large amount of food (residue) in the stomach and                        small bowel.                       - The examined portion of the jejunum was normal.                       - No specimens collected. Recommendation:       - Double balloon enteroscopy at [facility]. Procedure Code(s):    --- Professional ---                       [phone], Small intestinal endoscopy, enteroscopy beyond                        second portion of duodenum, not including ileum;                        diagnostic, including collection of specimen(s) by                        brushing or washing, when performed (separate procedure) Diagnosis Code(s):    --- Professional ---                       K92.2, Gastrointestinal hemorrhage, unspecified CPT copyright 2017 American Medical Association. All rights reserved. The codes documented in this report are preliminary and upon coder review may  be revised to meet current compliance requirements. [name] MD, MD 06/15/2017 12:16:47 PM This report has been signed electronically. Number of Addenda: 0 Note Initiated On: 06/15/2017 11:48 AM      [facility]

## 2017-06-15 NOTE — Progress Notes (Signed)
The patient has a stable Hb and no active bleeding was seen in the small bowel. Should be seen at [facility] for a double balloon enteroscopy. Nothing further to be done from a GI point of view.  I will sign off.  Please call if any further GI concerns or questions.  We would like to thank you for the opportunity to participate in the care of Stanley [name].

## 2017-06-15 NOTE — Progress Notes (Signed)
Pt is coughing up bright red blood with clots at this time. Dr. Allen Norris is at the bedside at this time. VS and orders assessed and will continue to monitor and tx pt according to MD orders.

## 2017-06-15 NOTE — Progress Notes (Signed)
[facility]  ROUNDING NOTE   Subjective:   Events for today noted.   Patient now stable in his room   Objective:  Vital signs in last 24 hours:  Temp:  [97.8 F (36.6 C)-98.3 F (36.8 C)] 97.9 F (36.6 C) (04/04 1459) Pulse Rate:  [78-91] 88 (04/04 1516) Resp:  [18-25] 18 (04/04 1516) BP: (100-140)/(62-118) 103/65 (04/04 1516) SpO2:  [92 %-100 %] 92 % (04/04 1516) Weight:  [70.8 kg (156 lb)-71.1 kg (156 lb 11.2 oz)] 70.8 kg (156 lb) (04/04 1108)  Weight change: 0.236 kg (8.3 oz) Filed Weights   06/14/17 0842 06/15/17 0411 06/15/17 1108  Weight: 69.5 kg (153 lb 3.5 oz) 71.1 kg (156 lb 11.2 oz) 70.8 kg (156 lb)    Intake/Output: I/O last 3 completed shifts: In: 1993.8 [P.O.:120; I.V.:1563.8; Blood:310] Out: 700 [Urine:700]   Intake/Output this shift:  Total I/O In: 400 [I.V.:400] Out: 150 [Urine:150]  Physical Exam: General: NAD  Head: Normocephalic, atraumatic. Moist oral mucosal membranes  Eyes: Anicteric  Neck: Supple  Lungs:  Clear to auscultation, normal effort  Heart: Regular rate and rhythm  Abdomen:  Soft, nontender, distended  Extremities: no peripheral edema.  Neurologic: Nonfocal, moving all four extremities  Skin: No lesions  Access: RUE AVF - no bruit or thrill, RIJ permcath    Basic Metabolic Panel: Recent Labs  Lab 06/09/17 1434 06/12/17 1427 06/14/17 0439 06/15/17 0152  NA 139 136 137 139  K 3.9 6.9* 4.9 4.4  CL 98* 100* 100* 101  CO2 29 22 26 29   GLUCOSE 117* 77 103* 129*  BUN 34* 74* 49* 33*  CREATININE 6.30* 8.76* 6.34* 4.39*  CALCIUM 7.9* 7.8* 7.2* 7.0*  MG  --   --  1.7  --   PHOS 3.0  2.1*  --   --   --     Liver Function Tests: Recent Labs  Lab 06/09/17 1434  ALBUMIN 2.3*   No results for input(s): LIPASE, AMYLASE in the last 168 hours. No results for input(s): AMMONIA in the last 168 hours.  CBC: Recent Labs  Lab 06/09/17 1434 06/12/17 1427  06/14/17 0439 06/14/17 1441 06/14/17 2004  06/15/17 0152 06/15/17 0839  WBC 7.3 10.5  --  9.5  --   --  10.5  --   HGB 7.9* 5.2*   < > 7.5* 7.2* 6.9* 6.9* 7.0*  HCT 25.6* 17.3*   < > 23.4* 22.5* 20.8* 20.9* 21.8*  MCV 86.6 88.6  --  87.4  --   --  89.4  --   PLT 367 374  --  352  --   --  304  --    < > = values in this interval not displayed.    Cardiac Enzymes: No results for input(s): CKTOTAL, CKMB, CKMBINDEX, TROPONINI in the last 168 hours.  BNP: Invalid input(s): POCBNP  CBG: No results for input(s): GLUCAP in the last 168 hours.  Microbiology: Results for orders placed or performed during the hospital encounter of 06/02/17  MRSA PCR Screening     Status: None   Collection Time: 06/02/17  4:22 PM  Result Value Ref Range Status   MRSA by PCR NEGATIVE NEGATIVE Final    Comment:        The GeneXpert MRSA Assay (FDA approved for NASAL specimens only), is one component of a comprehensive MRSA colonization surveillance program. It is not intended to diagnose MRSA infection nor to guide or monitor treatment for MRSA infections. Performed at [facility], [address], [place], [place] [address]   Body fluid culture     Status: None   Collection Time: 06/05/17 10:28 AM  Result Value Ref Range Status   Specimen Description   Final    PERITONEAL Performed at [facility], [address] [place], [place] [address]    Special Requests   Final    NONE Performed at [facility], [address] [place], [place] [address]    Gram Stain   Final    WBC PRESENT,BOTH PMN AND MONONUCLEAR NO ORGANISMS SEEN CYTOSPIN SMEAR    Culture   Final    NO GROWTH 3 DAYS Performed at [facility], [place] [address] [place], [place] [address]    Report Status 06/08/2017 FINAL  Final    Coagulation Studies: No results for input(s): LABPROT, INR in the last 72 hours.  Urinalysis: No results for input(s): COLORURINE, LABSPEC, PHURINE, GLUCOSEU, HGBUR, BILIRUBINUR, KETONESUR, PROTEINUR,  UROBILINOGEN, NITRITE, LEUKOCYTESUR in the last 72 hours.  Invalid input(s): APPERANCEUR    Imaging: Nm Gi Blood Loss  Result Date: 06/13/2017 CLINICAL DATA:  Rectal bleeding.  Anemia. EXAM: NUCLEAR MEDICINE GASTROINTESTINAL BLEEDING SCAN TECHNIQUE: Sequential abdominal images were obtained following intravenous administration of Tc-65m labeled red blood cells. RADIOPHARMACEUTICALS:  21.5 mCi Tc-50m pertechnetate in-vitro labeled red cells. COMPARISON:  None. FINDINGS: There is vague activity in the left abdomen best seen during the first hour of imaging suggesting proximal small bowel activity. Activity over the stomach could be from free pertechnetate or bleeding. No definite colonic activity. Expected appearance of the liver, spleen, and blood pool. Genital activity along the pelvis. IMPRESSION: 1. Left upper quadrant activity in small bowel, likely proximal small bowel, compatible with active bleeding. Electronically Signed   By: Van Clines M.D.   On: 06/13/2017 20:31     Medications:   . acetaminophen  1,000 mg Oral Once  . amitriptyline  50 mg Oral QHS  . budesonide (PULMICORT) nebulizer solution  0.5 mg Nebulization BID  . feeding supplement  1 Container Oral TID BM  . furosemide  80 mg Intravenous BID  . multivitamin  1 tablet Oral QHS  . multivitamin with minerals  1 tablet Oral Once per day on Mon Wed Fri  . nicotine  7 mg Transdermal Daily  . oxymetazoline      . pantoprazole  40 mg Oral BID  . tiotropium  18 mcg Inhalation Daily  . vitamin C  500 mg Oral BID      Assessment/ Plan:  [name] is a 56 y.o. black male withend stage renal disease on hemodialysis secondary to Alport's syndrome, ascites, hypertension, anemia of chronic kidney disease, coronary artery disease, peripheral vascular disease, hyperlipidemia, gastrointestinal AVMs, pulmonary hypertension,   nooutpatientdialysis unit currently  1.  End-stage renal disease: MWF schedule.  Hemodialysis schedule for tomorrow.   2. Anemia with chronic kidney disease: acute blood loss from GI bleed/epistaxis - Appreciate ENT and GI input Status post PRBC transfusion and multiple endoscopies.   3. Dialysis device complication: AVF nonfunction.  Appreciate Vascular input. Needs evaluation for a new access.   4. Hypertension: hypotensive.  - furosemide IV - will discontinue   LOS: 3 Stanley [name] 4/4/20194:22 PM

## 2017-06-16 ENCOUNTER — Encounter: Payer: Self-pay | Admitting: Gastroenterology

## 2017-06-16 LAB — BPAM RBC
BLOOD PRODUCT EXPIRATION DATE: 201904102359
BLOOD PRODUCT EXPIRATION DATE: 201904102359
BLOOD PRODUCT EXPIRATION DATE: 201904242359
BLOOD PRODUCT EXPIRATION DATE: 201904242359
BLOOD PRODUCT EXPIRATION DATE: 201904242359
ISSUE DATE / TIME: 201904012226
ISSUE DATE / TIME: 201904020954
ISSUE DATE / TIME: 201904021620
UNIT TYPE AND RH: 6200
UNIT TYPE AND RH: 6200
Unit Type and Rh: 6200
Unit Type and Rh: 6200
Unit Type and Rh: 6200

## 2017-06-16 LAB — TYPE AND SCREEN
ABO/RH(D): A POS
ANTIBODY SCREEN: NEGATIVE
UNIT DIVISION: 0
UNIT DIVISION: 0
Unit division: 0
Unit division: 0
Unit division: 0

## 2017-06-16 LAB — BASIC METABOLIC PANEL
ANION GAP: 11 (ref 5–15)
BUN: 49 mg/dL — AB (ref 6–20)
CO2: 27 mmol/L (ref 22–32)
Calcium: 7.1 mg/dL — ABNORMAL LOW (ref 8.9–10.3)
Chloride: 100 mmol/L — ABNORMAL LOW (ref 101–111)
Creatinine, Ser: 5.89 mg/dL — ABNORMAL HIGH (ref 0.61–1.24)
GFR calc Af Amer: 11 mL/min — ABNORMAL LOW (ref >60)
GFR, EST NON AFRICAN AMERICAN: 9 mL/min — AB (ref >60)
GLUCOSE: 100 mg/dL — AB (ref 65–99)
POTASSIUM: 5.6 mmol/L — AB (ref 3.5–5.1)
Sodium: 138 mmol/L (ref 135–145)

## 2017-06-16 LAB — CBC
HEMATOCRIT: 21.4 % — AB (ref 40.0–52.0)
Hemoglobin: 7.4 g/dL — ABNORMAL LOW (ref 13.0–18.0)
MCH: 30.5 pg (ref 26.0–34.0)
MCHC: 34.5 g/dL (ref 32.0–36.0)
MCV: 88.4 fL (ref 80.0–100.0)
PLATELETS: 342 10*3/uL (ref 150–440)
RBC: 2.43 MIL/uL — AB (ref 4.40–5.90)
RDW: 17.4 % — ABNORMAL HIGH (ref 11.5–14.5)
WBC: 10 10*3/uL (ref 3.8–10.6)

## 2017-06-16 MED ORDER — NEPRO/CARBSTEADY PO LIQD: 237.0000 mL | Freq: Two times a day (BID) | ORAL | Status: DC

## 2017-06-16 MED ORDER — EPOETIN ALFA 10000 UNIT/ML IJ SOLN
10000.0000 [IU] | INTRAMUSCULAR | Status: DC
  Administered 2017-06-16: 10000 [IU] via INTRAVENOUS

## 2017-06-16 NOTE — Progress Notes (Signed)
Hd completed 

## 2017-06-16 NOTE — Progress Notes (Signed)
[facility] [place] NAME: Stanley [name]    MR#:  [id number]  DATE OF BIRTH:  [birth year]  SUBJECTIVE:  CHIEF COMPLAINT:   Chief Complaint  Patient presents with  . regular dialysis  . Rectal Bleeding   Received Blood transfusion, s/p EGD, noted to have Bleed from nose, Called ENT to scope. Seen post procedure in recovery area, no complains.  REVIEW OF SYSTEMS:  CONSTITUTIONAL: No fever,positive for fatigue or weakness.  EYES: No blurred or double vision.  EARS, NOSE, AND THROAT: No tinnitus or ear pain.  RESPIRATORY: No cough, shortness of breath, wheezing or hemoptysis.  CARDIOVASCULAR: No chest pain, orthopnea, edema.  GASTROINTESTINAL: No nausea, vomiting, diarrhea or abdominal pain.  GENITOURINARY: No dysuria, hematuria.  ENDOCRINE: No polyuria, nocturia,  HEMATOLOGY: No anemia, easy bruising or bleeding SKIN: No rash or lesion. MUSCULOSKELETAL: No joint pain or arthritis.   NEUROLOGIC: No tingling, numbness, weakness.  PSYCHIATRY: No anxiety or depression.   ROS  DRUG ALLERGIES:  No Known Allergies  VITALS:  Blood pressure 123/85, pulse 70, temperature 98.2 F (36.8 C), temperature source Oral, resp. rate 18, height 6\' 3"  (1.905 m), weight 70.8 kg (156 lb), SpO2 95 %.  PHYSICAL EXAMINATION:  GENERAL:  57 y.o.-year-old patient lying in the bed with no acute distress.  EYES: Pupils equal, round, reactive to light and accommodation. No scleral icterus. Extraocular muscles intact.  HEENT: Head atraumatic, normocephalic. Oropharynx and nasopharynx clear.  NECK:  Supple, no jugular venous distention. No thyroid enlargement, no tenderness.  LUNGS: Normal breath sounds bilaterally, no wheezing, rales,rhonchi or crepitation. No use of accessory muscles of respiration.  CARDIOVASCULAR: S1, S2 normal. No murmurs, rubs, or gallops.  ABDOMEN: Soft, nontender, nondistended. Bowel sounds present. No organomegaly or mass.  EXTREMITIES: No  pedal edema, cyanosis, or clubbing.  NEUROLOGIC: Cranial nerves II through XII are intact. Muscle strength 4/5 in all extremities. Sensation intact. Gait not checked.  PSYCHIATRIC: The patient is alert and oriented x 3.  SKIN: No obvious rash, lesion, or ulcer.   Physical Exam LABORATORY PANEL:   CBC Recent Labs  Lab 06/16/17 0520  WBC 10.0  HGB 7.4*  HCT 21.4*  PLT 342   ------------------------------------------------------------------------------------------------------------------  Chemistries  Recent Labs  Lab 06/14/17 0439  06/16/17 0520  NA 137   < > 138  K 4.9   < > 5.6*  CL 100*   < > 100*  CO2 26   < > 27  GLUCOSE 103*   < > 100*  BUN 49*   < > 49*  CREATININE 6.34*   < > 5.89*  CALCIUM 7.2*   < > 7.1*  MG 1.7  --   --    < > = values in this interval not displayed.   ------------------------------------------------------------------------------------------------------------------  Cardiac Enzymes No results for input(s): TROPONINI in the last 168 hours. ------------------------------------------------------------------------------------------------------------------  RADIOLOGY:  No results found.  ASSESSMENT AND PLAN:  1 acute melena GI bleed  Continued dark lower GI bleeding  Noted history of cirrhosis, peptic ulcer disease, history of intestinal AVMs Hematemesis and melena on admission In discussion with gastroenterology today/Dr. Allen Norris -patient has had extensive upper and lower frequent endoscopies by his gastroenterology group/patient noncompliant with following up at [facility] for further workup/gastroenterology   s/p 2 units packed red blood cells, H&H every 6 hours, CBC daily, and transfuse as needed Most recent endoscopy shown gastritis  EGD done today, no active bleeding spots, but some bleed from posterior  nasal cavity noted.  2 acute symptomatic anemia Improved status post transfusion Plan of care per above  3 acute hyperkalemia Resolved  with Kayexalate and HD  4 chronic ESRD HD for Monday Wednesday and Friday Nephrology input appreciated-currently receiving hemodialysis  5 chronic tobacco abuse disorder Stable Nicotine patch and cessation counseling ordered  6 COPD without exacerbation Noted chronic cough Breathing treatments as needed, Pulmicort  7 acute on chronic malnutrition  Dietary consulted  8 acute on chronic noncompliance of medical management Patient threatening to leave AMA despite serious medical condition multiple times in past. All questions answered noted long history of noncompliance with medical management/follow-up per gastroenterology discussion Consult palliative care services   All the records are reviewed and case discussed with Care Management/Social Workerr. Management plans discussed with the patient, family and they are in agreement.  CODE STATUS: full  TOTAL TIME TAKING CARE OF THIS PATIENT: 35 minutes.     POSSIBLE D/C IN 1-3 DAYS, DEPENDING ON CLINICAL CONDITION.   Vaughan Basta M.D on 06/16/2017   Between 7am to 6pm - Pager - [phone]  After 6pm go to [email or web address] - password [email or web address] [facility]  Office  [phone]  CC: Primary care physician; [name], MD  Note: This dictation was prepared with Dragon dictation along with smaller phrase technology. Any transcriptional errors that result from this process are unintentional.

## 2017-06-16 NOTE — Progress Notes (Signed)
This note also relates to the following rows which could not be included: Pulse Rate - Cannot attach notes to unvalidated device data Resp - Cannot attach notes to unvalidated device data BP - Cannot attach notes to unvalidated device data SpO2 - Cannot attach notes to unvalidated device data  Hd started  

## 2017-06-16 NOTE — Progress Notes (Signed)
Post dialysis assessment 

## 2017-06-16 NOTE — Progress Notes (Signed)
Discharge instructions explained to pt/ verbalized an understanding / iv and tele removed/ transported off unit via wheelchair.  

## 2017-06-16 NOTE — Care Management (Signed)
Discharge to home today per Dr. Anselm Jungling. Telephone call to Elvera Bicker, [employer] representative. States that this patient doesn't have a seat anywhere. He comes to the emergency room when he needs dialysis. Shelbie Ammons RN MSN CCM Care Management [phone]

## 2017-06-16 NOTE — Anesthesia Postprocedure Evaluation (Signed)
Anesthesia Post Note  Patient: Stanley [name]  Procedure(s) Performed: ESOPHAGOGASTRODUODENOSCOPY (EGD) WITH PROPOFOL (N/A )  Patient location during evaluation: Endoscopy Anesthesia Type: General Level of consciousness: awake and alert Pain management: pain level controlled Vital Signs Assessment: post-procedure vital signs reviewed and stable Respiratory status: spontaneous breathing, nonlabored ventilation, respiratory function stable and patient connected to nasal cannula oxygen Cardiovascular status: blood pressure returned to baseline and stable Postop Assessment: no apparent nausea or vomiting Anesthetic complications: no Comments: Multiple GI/ENT procedures, but recovering well      Last Vitals:  Vitals:   06/16/17 1215 06/16/17 1317  BP: 129/73 123/70  Pulse: 82 87  Resp: 16 18  Temp: 37.2 C   SpO2:  94%    Last Pain:  Vitals:   06/16/17 1215  TempSrc: Oral  PainSc:                  Alphonsus Sias

## 2017-06-16 NOTE — Progress Notes (Signed)
[facility]  ROUNDING NOTE   Subjective:   Seen and examined on hemodialysis. Tolerating treatment well.     HEMODIALYSIS FLOWSHEET:  Blood Flow Rate (mL/min): 400 mL/min Arterial Pressure (mmHg): -150 mmHg Venous Pressure (mmHg): 140 mmHg Transmembrane Pressure (mmHg): 70 mmHg Ultrafiltration Rate (mL/min): 630 mL/min Dialysate Flow Rate (mL/min): 800 ml/min Conductivity: Machine : 14.2 Conductivity: Machine : 14.2 Dialysis Fluid Bolus: Normal Saline Bolus Amount (mL): 250 mL    Objective:  Vital signs in last 24 hours:  Temp:  [97.9 F (36.6 C)-98.4 F (36.9 C)] 98.4 F (36.9 C) (04/05 0935) Pulse Rate:  [70-91] 81 (04/05 1115) Resp:  [15-25] 23 (04/05 1115) BP: (100-140)/(62-118) 126/77 (04/05 1115) SpO2:  [92 %-100 %] 99 % (04/05 0935)  Weight change: 1.261 kg (2 lb 12.5 oz) Filed Weights   06/14/17 0842 06/15/17 0411 06/15/17 1108  Weight: 69.5 kg (153 lb 3.5 oz) 71.1 kg (156 lb 11.2 oz) 70.8 kg (156 lb)    Intake/Output: I/O last 3 completed shifts: In: 1450 [I.V.:1450] Out: 450 [Urine:450]   Intake/Output this shift:  No intake/output data recorded.  Physical Exam: General: NAD  Head: Normocephalic, atraumatic. Moist oral mucosal membranes  Eyes: Anicteric  Neck: Supple  Lungs:  Clear to auscultation, normal effort  Heart: Regular rate and rhythm  Abdomen:  Soft, nontender, distended  Extremities: no peripheral edema.  Neurologic: Nonfocal, moving all four extremities  Skin: No lesions  Access: RUE AVF - no bruit or thrill, RIJ permcath    Basic Metabolic Panel: Recent Labs  Lab 06/09/17 1434 06/12/17 1427 06/14/17 0439 06/15/17 0152 06/16/17 0520  NA 139 136 137 139 138  K 3.9 6.9* 4.9 4.4 5.6*  CL 98* 100* 100* 101 100*  CO2 29 22 26 29 27   GLUCOSE 117* 77 103* 129* 100*  BUN 34* 74* 49* 33* 49*  CREATININE 6.30* 8.76* 6.34* 4.39* 5.89*  CALCIUM 7.9* 7.8* 7.2* 7.0* 7.1*  MG  --   --  1.7  --   --   PHOS 3.0  2.1*   --   --   --   --     Liver Function Tests: Recent Labs  Lab 06/09/17 1434  ALBUMIN 2.3*   No results for input(s): LIPASE, AMYLASE in the last 168 hours. No results for input(s): AMMONIA in the last 168 hours.  CBC: Recent Labs  Lab 06/09/17 1434 06/12/17 1427  06/14/17 0439 06/14/17 1441 06/14/17 2004 06/15/17 0152 06/15/17 0839 06/16/17 0520  WBC 7.3 10.5  --  9.5  --   --  10.5  --  10.0  HGB 7.9* 5.2*   < > 7.5* 7.2* 6.9* 6.9* 7.0* 7.4*  HCT 25.6* 17.3*   < > 23.4* 22.5* 20.8* 20.9* 21.8* 21.4*  MCV 86.6 88.6  --  87.4  --   --  89.4  --  88.4  PLT 367 374  --  352  --   --  304  --  342   < > = values in this interval not displayed.    Cardiac Enzymes: No results for input(s): CKTOTAL, CKMB, CKMBINDEX, TROPONINI in the last 168 hours.  BNP: Invalid input(s): POCBNP  CBG: No results for input(s): GLUCAP in the last 168 hours.  Microbiology: Results for orders placed or performed during the hospital encounter of 06/02/17  MRSA PCR Screening     Status: None   Collection Time: 06/02/17  4:22 PM  Result Value Ref Range Status   MRSA  by PCR NEGATIVE NEGATIVE Final    Comment:        The GeneXpert MRSA Assay (FDA approved for NASAL specimens only), is one component of a comprehensive MRSA colonization surveillance program. It is not intended to diagnose MRSA infection nor to guide or monitor treatment for MRSA infections. Performed at [facility], [address], [place], [place] [address]   Body fluid culture     Status: None   Collection Time: 06/05/17 10:28 AM  Result Value Ref Range Status   Specimen Description   Final    PERITONEAL Performed at [facility], [address] [place], [place] [address]    Special Requests   Final    NONE Performed at [facility], [address] [place], [place] [address]    Gram Stain   Final    WBC PRESENT,BOTH PMN AND MONONUCLEAR NO ORGANISMS SEEN CYTOSPIN SMEAR     Culture   Final    NO GROWTH 3 DAYS Performed at [facility], [place] [address], [place], [place] [address]    Report Status 06/08/2017 FINAL  Final    Coagulation Studies: No results for input(s): LABPROT, INR in the last 72 hours.  Urinalysis: No results for input(s): COLORURINE, LABSPEC, PHURINE, GLUCOSEU, HGBUR, BILIRUBINUR, KETONESUR, PROTEINUR, UROBILINOGEN, NITRITE, LEUKOCYTESUR in the last 72 hours.  Invalid input(s): APPERANCEUR    Imaging: No results found.   Medications:   . acetaminophen  1,000 mg Oral Once  . amitriptyline  50 mg Oral QHS  . budesonide (PULMICORT) nebulizer solution  0.5 mg Nebulization BID  . feeding supplement (NEPRO CARB STEADY)  237 mL Oral BID BM  . multivitamin  1 tablet Oral QHS  . multivitamin with minerals  1 tablet Oral Once per day on Mon Wed Fri  . nicotine  7 mg Transdermal Daily  . pantoprazole  40 mg Oral BID  . sodium chloride flush  3 mL Intravenous Q12H  . tiotropium  18 mcg Inhalation Daily  . vitamin C  500 mg Oral BID      Assessment/ Plan:  [name] is a 57 y.o. black male withend stage renal disease on hemodialysis secondary to Alport's syndrome, ascites, hypertension, anemia of chronic kidney disease, coronary artery disease, peripheral vascular disease, hyperlipidemia, gastrointestinal AVMs, pulmonary hypertension,   nooutpatientdialysis unit currently  1.  End-stage renal disease: MWF schedule. Seen and examined on hemodialysis. Toleratign treatment well.   2. Anemia with chronic kidney disease: acute blood loss from GI bleed/epistaxis. Hemoglobin 7.4 - Appreciate ENT and GI input Status post PRBC transfusion and multiple endoscopies.   3. Dialysis device complication: AVF nonfunction.  Appreciate Vascular input. Needs evaluation for a new access.   4. Hypertension: hypotensive.  - furosemide IV - will discontinue   LOS: 4 Stanley [name] 4/5/201911:31 AM

## 2017-06-16 NOTE — Progress Notes (Signed)
Pre dialysis assessment 

## 2017-06-16 NOTE — Plan of Care (Signed)
No further signs of active bleed.  Pt continues to have frequent requests for liquids despite education on fluid restriction.

## 2017-06-17 LAB — HEPATITIS B SURFACE ANTIBODY, QUANTITATIVE: Hepatitis B-Post: 50.3 m[IU]/mL (ref >9.9)

## 2017-06-17 LAB — HEPATITIS B SURFACE ANTIGEN: HEP B S AG: NEGATIVE

## 2017-06-19 ENCOUNTER — Ambulatory Visit
Admission: RE | Admit: 2017-06-19 | Discharge: 2017-06-19 | Disposition: A | Discharge status: Home / Self Care | Payer: Medicare Other | Source: Ambulatory Visit | Attending: Nephrology | Admitting: Nephrology

## 2017-06-19 ENCOUNTER — Emergency Department
Admission: EM | Admit: 2017-06-19 | Discharge: 2017-06-19 | Disposition: A | Discharge status: Home / Self Care | Payer: Medicare Other | Attending: Student in an Organized Health Care Education/Training Program | Admitting: Student in an Organized Health Care Education/Training Program

## 2017-06-19 ENCOUNTER — Encounter: Payer: Self-pay | Admitting: Emergency Medicine

## 2017-06-19 DIAGNOSIS — E875 Hyperkalemia: Secondary | ICD-10-CM | POA: Diagnosis not present

## 2017-06-19 DIAGNOSIS — Z992 Dependence on renal dialysis: Secondary | ICD-10-CM | POA: Insufficient documentation

## 2017-06-19 DIAGNOSIS — N186 End stage renal disease: Secondary | ICD-10-CM | POA: Diagnosis not present

## 2017-06-19 DIAGNOSIS — D649 Anemia, unspecified: Secondary | ICD-10-CM | POA: Diagnosis not present

## 2017-06-19 DIAGNOSIS — N2581 Secondary hyperparathyroidism of renal origin: Secondary | ICD-10-CM | POA: Diagnosis not present

## 2017-06-19 DIAGNOSIS — I509 Heart failure, unspecified: Secondary | ICD-10-CM | POA: Diagnosis not present

## 2017-06-19 DIAGNOSIS — I132 Hypertensive heart and chronic kidney disease with heart failure and with stage 5 chronic kidney disease, or end stage renal disease: Secondary | ICD-10-CM | POA: Insufficient documentation

## 2017-06-19 DIAGNOSIS — J449 Chronic obstructive pulmonary disease, unspecified: Secondary | ICD-10-CM | POA: Diagnosis not present

## 2017-06-19 DIAGNOSIS — I2510 Atherosclerotic heart disease of native coronary artery without angina pectoris: Secondary | ICD-10-CM | POA: Insufficient documentation

## 2017-06-19 DIAGNOSIS — D631 Anemia in chronic kidney disease: Secondary | ICD-10-CM | POA: Diagnosis not present

## 2017-06-19 MED ORDER — EPOETIN ALFA 10000 UNIT/ML IJ SOLN
10000.0000 [IU] | Freq: Once | INTRAMUSCULAR | Status: AC
  Administered 2017-06-19: 10000 [IU] via INTRAVENOUS

## 2017-06-19 NOTE — ED Provider Notes (Addendum)
[facility] Emergency Department Provider Note    First MD Initiated Contact with Patient 06/19/17 1147     (approximate)  I have reviewed the triage vital signs and the nursing notes.   HISTORY  Chief Complaint dialysis    HPI Stanley [name] is a 57 y.o. male with esrd on dialysis presents for routine dialysis.  Denies any chestpain, sob, abd pain, swelling or any other symptoms at this time.  Last dialysis was Friday.     Past Medical History:  Diagnosis Date  . Alcohol abuse   . CHF (congestive heart failure) ([place])   . Cirrhosis ([place])   . Coronary artery disease 2009  . Drug abuse ([place])   . End stage renal disease on dialysis [facility]) NEPHROLOGIST-   [facility]  IN [place]   HEMODIALYSIS --   TUES/  THURS/  SAT  . Gastrointestinal bleed 06/13/2017   From chart...hx of multiple GI bleeds  . GERD (gastroesophageal reflux disease)   . Hyperlipidemia   . Hypertension   . PAD (peripheral artery disease) ([place])   . Renal insufficiency    Per pt, 32 oz fluid restriction per day  . S/P triple vessel bypass 06/09/2016   2009ish  . Suicidal ideation    & HOMICIDAL IDEATION --  06-16-2013   ADMITTED TO BEHAVIOR HEALTH   Family History  Problem Relation Age of Onset  . Colon cancer Mother   . Cancer Father   . Cancer Sister   . Kidney disease Brother    Past Surgical History:  Procedure Laterality Date  . A/V FISTULAGRAM Right 06/06/2017   Procedure: A/V FISTULAGRAM;  Surgeon: Katha Cabal, MD;  Location: [place] CV LAB;  Service: Cardiovascular;  Laterality: Right;  . A/V SHUNT INTERVENTION N/A 06/06/2017   Procedure: A/V SHUNT INTERVENTION;  Surgeon: Katha Cabal, MD;  Location: [place] CV LAB;  Service: Cardiovascular;  Laterality: N/A;  . AGILE CAPSULE N/A 06/19/2016   Procedure: AGILE CAPSULE;  Surgeon: [name], MD;  Location: [facility] ENDOSCOPY;  Service: Endoscopy;  Laterality: N/A;  . COLONOSCOPY WITH PROPOFOL N/A  06/18/2016   Procedure: COLONOSCOPY WITH PROPOFOL;  Surgeon: [name], MD;  Location: [facility] ENDOSCOPY;  Service: Endoscopy;  Laterality: N/A;  . COLONOSCOPY WITH PROPOFOL N/A 08/12/2016   Procedure: COLONOSCOPY WITH PROPOFOL;  Surgeon: [name], MD;  Location[facility] ENDOSCOPY;  Service: Endoscopy;  Laterality: N/A;  . COLONOSCOPY WITH PROPOFOL N/A 05/05/2017   Procedure: COLONOSCOPY WITH PROPOFOL;  Surgeon: Manya Silvas, MD;  Location: [facility] ENDOSCOPY;  Service: Endoscopy;  Laterality: N/A;  . CORONARY ANGIOPLASTY  ?   PT UNABLE TO TELL IF  BEFORE OR AFTER  CABG  . CORONARY ARTERY BYPASS GRAFT  2008  ([place] , [place])   3 VESSEL  . DIALYSIS FISTULA CREATION  LAST SURGERY  APPOX  2008  . ENTEROSCOPY N/A 05/10/2016   Procedure: ENTEROSCOPY;  Surgeon: Jerene Bears, MD;  Location: [place];  Service: Gastroenterology;  Laterality: N/A;  . ENTEROSCOPY N/A 08/12/2016   Procedure: ENTEROSCOPY;  Surgeon: [name], MD;  Location: [facility] ENDOSCOPY;  Service: Endoscopy;  Laterality: N/A;  . ENTEROSCOPY Left 06/03/2017   Procedure: ENTEROSCOPY;  Surgeon: Virgel Manifold, MD;  Location: [facility] ENDOSCOPY;  Service: Endoscopy;  Laterality: Left;  Procedure date will ultimately depend on when patient is medically optimized before the procedure, pending hemodialysis and blood transfusions etc. Will place on schedule and change depending on clinical status.   Marland Kitchen  ENTEROSCOPY N/A 06/05/2017   Procedure: ENTEROSCOPY;  Surgeon: Virgel Manifold, MD;  Location: [facility] ENDOSCOPY;  Service: Endoscopy;  Laterality: N/A;  . ENTEROSCOPY N/A 06/15/2017   Procedure: Push ENTEROSCOPY;  Surgeon: [name], MD;  Location: [facility] ENDOSCOPY;  Service: Endoscopy;  Laterality: N/A;  . ESOPHAGOGASTRODUODENOSCOPY N/A 05/07/2015   Procedure: ESOPHAGOGASTRODUODENOSCOPY (EGD);  Surgeon: Hulen Luster, MD;  Location[facility] END[facility]COPY;  Service: Endoscopy;  Laterality: N/A;  . ESOPHAGOGASTRODUODENOSCOPY (EGD) WITH PROPOFOL N/A 05/17/2015    Procedure: ESOPHAGOGASTRODUODENOSCOPY (EGD) WITH PROPOFOL;  Surgeon: [name], MD;  Location: [facility] ENDOSCOPY;  Service: Endoscopy;  Laterality: N/A;  . ESOPHAGOGASTRODUODENOSCOPY (EGD) WITH PROPOFOL N/A 01/20/2016   Procedure: ESOPHAGOGASTRODUODENOSCOPY (EGD) WITH PROPOFOL;  Surgeon: [name], MD;  Location: [facility] ENDOSCOPY;  Service: Endoscopy;  Laterality: N/A;  . ESOPHAGOGASTRODUODENOSCOPY (EGD) WITH PROPOFOL N/A 04/17/2016   Procedure: ESOPHAGOGASTRODUODENOSCOPY (EGD) WITH PROPOFOL;  Surgeon: Lin Landsman, MD;  Location: [facility] ENDOSCOPY;  Service: Gastroenterology;  Laterality: N/A;  . ESOPHAGOGASTRODUODENOSCOPY (EGD) WITH PROPOFOL  05/09/2016   Procedure: ESOPHAGOGASTRODUODENOSCOPY (EGD) WITH PROPOFOL;  Surgeon: Jerene Bears, MD;  Location: [place];  Service: Endoscopy;;  . ESOPHAGOGASTRODUODENOSCOPY (EGD) WITH PROPOFOL N/A 06/16/2016   Procedure: ESOPHAGOGASTRODUODENOSCOPY (EGD) WITH PROPOFOL;  Surgeon: [name], MD;  Location: [facility] ENDOSCOPY;  Service: Endoscopy;  Laterality: N/A;  . ESOPHAGOGASTRODUODENOSCOPY (EGD) WITH PROPOFOL N/A 05/05/2017   Procedure: ESOPHAGOGASTRODUODENOSCOPY (EGD) WITH PROPOFOL;  Surgeon: Manya Silvas, MD;  Location: [facility] ENDOSCOPY;  Service: Endoscopy;  Laterality: N/A;  . ESOPHAGOGASTRODUODENOSCOPY (EGD) WITH PROPOFOL N/A 06/15/2017   Procedure: ESOPHAGOGASTRODUODENOSCOPY (EGD) WITH PROPOFOL;  Surgeon: [name], MD;  Location: [facility] ENDOSCOPY;  Service: Endoscopy;  Laterality: N/A;  . GIVENS CAPSULE STUDY N/A 05/07/2016   Procedure: GIVENS CAPSULE STUDY;  Surgeon: Doran Stabler, MD;  Location: [place];  Service: Endoscopy;  Laterality: N/A;  . MANDIBULAR HARDWARE REMOVAL N/A 07/29/2013   Procedure: REMOVAL OF ARCH BARS;  Surgeon: Theodoro Kos, DO;  Location: [place];  Service: Plastics;  Laterality: N/A;  . ORIF MANDIBULAR FRACTURE N/A 06/05/2013   Procedure: REPAIR OF MANDIBULAR FRACTURE x 2 with maxillo-mandibular  fixation ;  Surgeon: Theodoro Kos, DO;  Location: [place];  Service: Plastics;  Laterality: N/A;  . PERIPHERAL ARTERIAL STENT GRAFT Left    Patient Active Problem List   Diagnosis Date Noted  . Protein-calorie malnutrition, severe 06/14/2017  . Encounter for dialysis [facility])   . Palliative care by specialist   . Goals of care, counseling/discussion   . Malnutrition of moderate degree 06/05/2017  . Secondary esophageal varices without bleeding ([place])   . Stomach irritation   . Idiopathic esophageal varices without bleeding ([place])   . Alcoholic hepatitis with ascites 05/24/2017  . ESRD (end stage renal disease) ([place]) 04/28/2017  . Uremia 03/08/2017  . ESRD on hemodialysis ([place]) 03/03/2017  . Weakness 02/28/2017  . Hypocalcemia 02/22/2017  . Shortness of breath 11/26/2016  . COPD (chronic obstructive pulmonary disease) ([place]) 10/30/2016  . COPD exacerbation ([place]) 10/29/2016  . Anemia   . Heme positive stool   . Ulceration of intestine   . Benign neoplasm of transverse colon   . Acute gastrointestinal hemorrhage   . Esophageal candidiasis ([place])   . Angiodysplasia of intestinal tract   . Acute respiratory failure with hypoxia ([place]) 07/03/2016  . GI bleeding 06/24/2016  . Rectal bleeding 06/14/2016  . Anemia of chronic disease 06/01/2016  . MRSA carrier 06/01/2016  . Chronic renal failure 05/23/2016  . Ischemic heart disease 05/23/2016  .  Angiodysplasia of small intestine   . Melena   . Small bowel bleed not requiring more than 4 units of blood in 24 hours, ICU, or surgery   . Anemia due to chronic blood loss   . Abdominal pain 05/05/2016  . Acute posthemorrhagic anemia 04/17/2016  . Gastrointestinal bleed 04/17/2016  . History of esophagogastroduodenoscopy (EGD) 04/17/2016  . Elevated troponin 04/17/2016  . Alcohol abuse 04/17/2016  . Upper GI bleed 01/19/2016  . Blood in stool   . Angiodysplasia of stomach and duodenum with hemorrhage   . Gastritis   . Reflux esophagitis   .  GI bleed 05/16/2015  . Acute GI bleeding   . Symptomatic anemia 04/30/2015  . HTN (hypertension) 04/06/2015  . GERD (gastroesophageal reflux disease) 04/06/2015  . HLD (hyperlipidemia) 04/06/2015  . Dyspnea 04/06/2015  . Cirrhosis of liver with ascites ([place]) 04/06/2015  . Ascites 04/06/2015  . GIB (gastrointestinal bleeding) 03/23/2015  . Homicidal ideation 06/19/2013  . Suicidal intent 06/19/2013  . Homicidal ideations 06/19/2013  . Hyperkalemia 06/16/2013  . Mandible fracture ([place]) 06/05/2013  . Fracture, mandible ([place]) 06/02/2013  . Coronary atherosclerosis of native coronary artery 06/02/2013  . ESRD on dialysis ([place]) 06/02/2013  . Mandible open fracture ([place]) 06/02/2013      Prior to Admission medications   Medication Sig Start Date End Date Taking? Authorizing Provider  albuterol (PROVENTIL HFA;VENTOLIN HFA) 108 (90 Base) MCG/ACT inhaler Inhale 2 puffs into the lungs every 6 (six) hours as needed for wheezing or shortness of breath. 04/21/17   Lisa Roca, MD  alum & mag hydroxide-simeth (MAALOX/MYLANTA) 200-200-20 MG/5ML suspension Take 15 mLs every 4 (four) hours as needed by mouth for indigestion or heartburn. Patient not taking: Reported on 04/19/2017 01/25/17   Demetrios Loll, MD  atorvastatin (LIPITOR) 40 MG tablet Take 1 tablet (40 mg total) by mouth daily. 11/16/16   Vaughan Basta, MD  budesonide-formoterol (SYMBICORT) 160-4.5 MCG/ACT inhaler Inhale 2 puffs into the lungs daily. 11/16/16   Vaughan Basta, MD  calcium acetate (PHOSLO) 667 MG capsule Take 2,001 mg by mouth 3 (three) times daily.    [provider]  ferrous sulfate 325 (65 FE) MG tablet Take 1 tablet by mouth 3 (three) times daily. 01/21/17   [provider]  furosemide (LASIX) 80 MG tablet Take 1 tablet (80 mg total) daily by mouth. 01/25/17   Demetrios Loll, MD  gabapentin (NEURONTIN) 300 MG capsule Take 1 capsule by mouth daily as directed 08/16/16   [provider]    ipratropium-albuterol (DUONEB) 0.5-2.5 (3) MG/3ML SOLN Take 3 mLs by nebulization every 6 (six) hours as needed. 11/28/16   Loletha Grayer, MD  labetalol (NORMODYNE) 100 MG tablet Take 100 mg by mouth daily. 03/24/17   [provider]  Multiple Vitamins-Minerals-FA (DIALYVITE SUPREME D) 3 MG TABS Take 1 tablet (3 mg total) by mouth daily. 11/28/16   Loletha Grayer, MD  nitroGLYCERIN (NITROSTAT) 0.4 MG SL tablet Place 1 tablet (0.4 mg total) under the tongue every 5 (five) minutes as needed. 04/12/16   Wende Bushy, MD  pantoprazole (PROTONIX) 40 MG tablet Take 1 tablet (40 mg total) by mouth daily. 11/28/16   Loletha Grayer, MD  Spacer/Aero Chamber Mouthpiece MISC 1 Units by Does not apply route every 4 (four) hours as needed (wheezing). 02/19/17   Darel Hong, MD  tiotropium (SPIRIVA HANDIHALER) 18 MCG inhalation capsule Place 1 capsule (18 mcg total) into inhaler and inhale daily. 11/16/16 11/16/17  Vaughan Basta, MD    Allergies  Patient has no known allergies.    Social History Social History   Tobacco Use  . Smoking status: Current Every Day Smoker    Packs/day: 0.15    Years: 40.00    Pack years: 6.00    Types: Cigarettes  . Smokeless tobacco: Never Used  Substance Use Topics  . Alcohol use: No    Comment: pt reports quitting after learning about cirrhosis  . Drug use: No    Frequency: 7.0 times per week    Types: Marijuana, Cocaine    Review of Systems Patient denies headaches, rhinorrhea, blurry vision, numbness, shortness of breath, chest pain, edema, cough, abdominal pain, nausea, vomiting, diarrhea, dysuria, fevers, rashes or hallucinations unless otherwise stated above in HPI. ____________________________________________   PHYSICAL EXAM:  VITAL SIGNS: Vitals:   06/19/17 1142  BP: (!) 102/59  Pulse: 81  Resp: 20  Temp: 97.8 F (36.6 C)  SpO2: 100%    Constitutional: Alert and oriented. Well appearing and in no acute distress. Eyes:  Conjunctivae are normal.  Head: Atraumatic. Nose: No congestion/rhinnorhea. Mouth/Throat: Mucous membranes are moist.   Neck: Painless ROM.  Cardiovascular:   Good peripheral circulation. Respiratory: Normal respiratory effort.  No retractions.  Gastrointestinal: Soft and nontender.  Musculoskeletal: No lower extremity tenderness .  No joint effusions. Neurologic:  Normal speech and language. No gross focal neurologic deficits are appreciated.  Skin:  Skin is warm, dry and intact. No rash noted. Psychiatric: Mood and affect are normal. Speech and behavior are normal.  ____________________________________________   LABS (all labs ordered are listed, but only abnormal results are displayed)  No results found for this or any previous visit (from the past 24 hour(s)). ____________________________________________ ____________________________________________   PROCEDURES  Procedure(s) performed:  Procedures    Critical Care performed: no ____________________________________________   INITIAL IMPRESSION / ASSESSMENT AND PLAN / ED COURSE  Pertinent labs & imaging results that were available during my care of the patient were reviewed by me and considered in my medical decision making (see chart for details).  DDX: routine dialysis, volume overload, electrolyte imbalance  Stanley [name] is a 57 y.o. who presents to the ED with end-stage renal disease on dialysis presents for routine dialysis.  Patient otherwise asymptomatic.  No evidence of volume overload.  Have consulted nephrology for dialysis.      ____________________________________________   FINAL CLINICAL IMPRESSION(S) / ED DIAGNOSES  Final diagnoses:  ESRD on dialysis ([place])      NEW MEDICATIONS STARTED DURING THIS VISIT:  New Prescriptions   No medications on file     Note:  This document was prepared using Dragon voice recognition software and may include unintentional dictation errors.       Merlyn Lot, MD 06/19/17 1158    Merlyn Lot, MD 06/19/17 1159

## 2017-06-19 NOTE — Progress Notes (Signed)
Post HD assessment     06/19/17 1623  Neurological  Level of Consciousness Alert  Orientation Level Oriented X4  Respiratory  Respiratory Pattern Regular;Unlabored  Chest Assessment Chest expansion symmetrical  Cardiac  ECG Monitor Yes  Vascular  R Radial Pulse +2  L Radial Pulse +2  Integumentary  Integumentary (WDL) X  Skin Color Appropriate for ethnicity  Musculoskeletal  Musculoskeletal (WDL) X  Generalized Weakness Yes  Assistive Device None  GU Assessment  Genitourinary (WDL) X  Genitourinary Symptoms  (HD)  Psychosocial  Psychosocial (WDL) WDL

## 2017-06-19 NOTE — Progress Notes (Signed)
Pre HD assessment    06/19/17 1325  Neurological  Level of Consciousness Alert  Orientation Level Oriented X4  Respiratory  Respiratory Pattern Regular;Unlabored  Chest Assessment Chest expansion symmetrical  Cardiac  ECG Monitor Yes  Vascular  R Radial Pulse +2  L Radial Pulse +2  Integumentary  Integumentary (WDL) X  Skin Color Appropriate for ethnicity  Musculoskeletal  Musculoskeletal (WDL) X  Generalized Weakness Yes  Assistive Device None  GU Assessment  Genitourinary (WDL) X  Genitourinary Symptoms  (HD)  Psychosocial  Psychosocial (WDL) WDL

## 2017-06-19 NOTE — ED Notes (Signed)
Pt here to get routine HD.  Report given to [facility], RN in dialysis.  Pt is A&Ox4, in NAD at this time.  Breathing even and non-labored.

## 2017-06-19 NOTE — ED Notes (Signed)
Pt discharged to home.  Family member driving.  Discharge instructions reviewed.  Verbalized understanding.  No questions or concerns at this time.  Teach back verified.  Pt in NAD.  No items left in ED.   

## 2017-06-19 NOTE — ED Provider Notes (Signed)
The patient had about 2 L taken off by dialysis and feels improved.  Discharged home in improved condition.   Darel Hong, MD 06/19/17 [phone]

## 2017-06-19 NOTE — Progress Notes (Signed)
Post HD assessment. Pt tolerated tx well without c/o or complications. Pt requested to be taken off of tx early, MD aware, MD at bedside. Net UF 1883, goal not met.    06/19/17 1621  Vital Signs  Temp (!) 97.5 F (36.4 C)  Temp Source Oral  Pulse Rate 86  Pulse Rate Source Monitor  Resp 15  BP (!) 114/59  BP Location Left Arm  BP Method Automatic  Patient Position (if appropriate) Sitting  Oxygen Therapy  SpO2 98 %  O2 Device Room Air  Dialysis Weight  Weight  (pt unable to weigh, in chair, no scale available)  Type of Weight Post-Dialysis  Post-Hemodialysis Assessment  Rinseback Volume (mL) 250 mL  KECN 60.1 V  Dialyzer Clearance Lightly streaked  Duration of HD Treatment -hour(s) 3 hour(s)  Hemodialysis Intake (mL) 600 mL  UF Total -Machine (mL) 2483 mL  Net UF (mL) 1883 mL  Tolerated HD Treatment Yes  Education / Care Plan  Dialysis Education Provided Yes  Documented Education in Care Plan Yes  Hemodialysis Catheter Right Internal jugular Double-lumen  No Placement Date or Time found.   Placed prior to admission: Yes  Orientation: Right  Access Location: Internal jugular  Hemodialysis Catheter Type: Double-lumen  Site Condition No complications  Blue Lumen Status Heparin locked  Red Lumen Status Heparin locked  Purple Lumen Status N/A  Catheter fill solution Heparin 1000 units/ml  Catheter fill volume (Arterial) 1.5 cc  Catheter fill volume (Venous) 1.5  Dressing Type Biopatch  Dressing Status Clean;Dry;Intact  Drainage Description None  Post treatment catheter status Capped and Clamped

## 2017-06-19 NOTE — ED Triage Notes (Signed)
FIRST NURSE NOTE-here for dialysis.  Last received Friday. NAD

## 2017-06-19 NOTE — Progress Notes (Signed)
[facility]  ROUNDING NOTE   Subjective:   Seen and examined on hemodialysis. Tolerating treatment well.     HEMODIALYSIS FLOWSHEET:  Blood Flow Rate (mL/min): 400 mL/min Arterial Pressure (mmHg): -150 mmHg Venous Pressure (mmHg): 120 mmHg Transmembrane Pressure (mmHg): 50 mmHg Ultrafiltration Rate (mL/min): 1000 mL/min Dialysate Flow Rate (mL/min): 800 ml/min Conductivity: Machine : 13.9 Conductivity: Machine : 13.9 Dialysis Fluid Bolus: Normal Saline Bolus Amount (mL): 250 mL    Objective:  Vital signs in last 24 hours:  Temp:  [97.5 F (36.4 C)-97.9 F (36.6 C)] 97.5 F (36.4 C) (04/08 1621) Pulse Rate:  [77-98] 86 (04/08 1621) Resp:  [15-24] 15 (04/08 1621) BP: (102-135)/(50-89) 114/59 (04/08 1621) SpO2:  [96 %-100 %] 98 % (04/08 1621) Weight:  [158 lb 15.9 oz (72.1 kg)-159 lb (72.1 kg)] 158 lb 15.9 oz (72.1 kg) (04/08 1324)  Weight change:  Filed Weights   06/19/17 1143 06/19/17 1324  Weight: 159 lb (72.1 kg) 158 lb 15.9 oz (72.1 kg)    Intake/Output: No intake/output data recorded.   Intake/Output this shift:  Total I/O In: -  Out: 1883 [Other:1883]  Physical Exam: General: NAD  Head: Normocephalic, atraumatic. Moist oral mucosal membranes  Eyes: Anicteric  Neck: Supple  Lungs:  Clear to auscultation, normal effort  Heart: Regular rate and rhythm  Abdomen:  Soft, nontender, distended  Extremities: 2+ peripheral edema.  Neurologic: Nonfocal, moving all four extremities  Skin: No lesions  Access: RUE AVF - no bruit or thrill, RIJ permcath    Basic Metabolic Panel: Recent Labs  Lab 06/14/17 0439 06/15/17 0152 06/16/17 0520  NA 137 139 138  K 4.9 4.4 5.6*  CL 100* 101 100*  CO2 26 29 27   GLUCOSE 103* 129* 100*  BUN 49* 33* 49*  CREATININE 6.34* 4.39* 5.89*  CALCIUM 7.2* 7.0* 7.1*  MG 1.7  --   --     Liver Function Tests: No results for input(s): AST, ALT, ALKPHOS, BILITOT, PROT, ALBUMIN in the last 168 hours. No  results for input(s): LIPASE, AMYLASE in the last 168 hours. No results for input(s): AMMONIA in the last 168 hours.  CBC: Recent Labs  Lab 06/14/17 0439 06/14/17 1441 06/14/17 2004 06/15/17 0152 06/15/17 0839 06/16/17 0520  WBC 9.5  --   --  10.5  --  10.0  HGB 7.5* 7.2* 6.9* 6.9* 7.0* 7.4*  HCT 23.4* 22.5* 20.8* 20.9* 21.8* 21.4*  MCV 87.4  --   --  89.4  --  88.4  PLT 352  --   --  304  --  342    Cardiac Enzymes: No results for input(s): CKTOTAL, CKMB, CKMBINDEX, TROPONINI in the last 168 hours.  BNP: Invalid input(s): POCBNP  CBG: No results for input(s): GLUCAP in the last 168 hours.  Microbiology: Results for orders placed or performed during the hospital encounter of 06/02/17  MRSA PCR Screening     Status: None   Collection Time: 06/02/17  4:22 PM  Result Value Ref Range Status   MRSA by PCR NEGATIVE NEGATIVE Final    Comment:        The GeneXpert MRSA Assay (FDA approved for NASAL specimens only), is one component of a comprehensive MRSA colonization surveillance program. It is not intended to diagnose MRSA infection nor to guide or monitor treatment for MRSA infections. Performed at [facility], [address], [place], [place] [address]   Body fluid culture     Status: None   Collection  Time: 06/05/17 10:28 AM  Result Value Ref Range Status   Specimen Description   Final    PERITONEAL Performed at [facility], [address] [place], [place] [address]    Special Requests   Final    NONE Performed at [facility], [address] [place], [place] [address]    Gram Stain   Final    WBC PRESENT,BOTH PMN AND MONONUCLEAR NO ORGANISMS SEEN CYTOSPIN SMEAR    Culture   Final    NO GROWTH 3 DAYS Performed at [facility], [place] [address], [place], [place] [address]    Report Status 06/08/2017 FINAL  Final    Coagulation Studies: No results for input(s): LABPROT, INR in the last 72 hours.  Urinalysis: No  results for input(s): COLORURINE, LABSPEC, PHURINE, GLUCOSEU, HGBUR, BILIRUBINUR, KETONESUR, PROTEINUR, UROBILINOGEN, NITRITE, LEUKOCYTESUR in the last 72 hours.  Invalid input(s): APPERANCEUR    Imaging: No results found.   Medications:       Assessment/ Plan:  [name] is a 57 y.o. black male withend stage renal disease on hemodialysis secondary to Alport's syndrome, ascites, hypertension, anemia of chronic kidney disease, coronary artery disease, peripheral vascular disease, hyperlipidemia, gastrointestinal AVMs, pulmonary hypertension,   nooutpatientdialysis unit currently  1.  Hyperkalemia Expected to improve with HD  2.  End-stage renal disease: MWF schedule. Seen and examined on hemodialysis. Tolerating treatment well.   3. Anemia with chronic kidney disease: acute blood loss from GI bleed/epistaxis.    EPO with HD  3. Dialysis device complication: AVF nonfunction.  Appreciate Vascular input. Needs evaluation for a new access.     LOS: 0 Stanley [name] 4/8/20194:49 PM

## 2017-06-19 NOTE — Progress Notes (Signed)
HD tx end   06/19/17 1615  Vital Signs  Pulse Rate 98  Pulse Rate Source Monitor  Resp 15  BP 129/73  BP Location Left Arm  BP Method Automatic  Patient Position (if appropriate) Sitting  Oxygen Therapy  SpO2 97 %  O2 Device Room Air  During Hemodialysis Assessment  Dialysis Fluid Bolus Normal Saline  Bolus Amount (mL) 250 mL  Intra-Hemodialysis Comments Tx completed

## 2017-06-19 NOTE — Progress Notes (Signed)
Incomplete report given RN will call me back when she can give more information on pt.

## 2017-06-19 NOTE — ED Triage Notes (Signed)
Pt here for dialysis

## 2017-06-19 NOTE — Progress Notes (Signed)
HD tx start   06/19/17 1328  Vital Signs  Pulse Rate 83  Pulse Rate Source Monitor  Resp (!) 22  BP 113/65  BP Location Left Arm  BP Method Automatic  Patient Position (if appropriate) Sitting  Oxygen Therapy  SpO2 97 %  O2 Device Room Air  During Hemodialysis Assessment  Blood Flow Rate (mL/min) 400 mL/min  Arterial Pressure (mmHg) -140 mmHg  Venous Pressure (mmHg) 110 mmHg  Transmembrane Pressure (mmHg) 60 mmHg  Ultrafiltration Rate (mL/min) 1000 mL/min  Dialysate Flow Rate (mL/min) 800 ml/min  Conductivity: Machine  14  HD Safety Checks Performed Yes  Dialysis Fluid Bolus Normal Saline  Bolus Amount (mL) 250 mL  Intra-Hemodialysis Comments Tx initiated  Hemodialysis Catheter Right Internal jugular Double-lumen  No Placement Date or Time found.   Placed prior to admission: Yes  Orientation: Right  Access Location: Internal jugular  Hemodialysis Catheter Type: Double-lumen  Blue Lumen Status Infusing  Red Lumen Status Infusing

## 2017-06-21 ENCOUNTER — Emergency Department
Admission: EM | Admit: 2017-06-21 | Discharge: 2017-06-21 | Disposition: A | Discharge status: Home / Self Care | Payer: Medicare Other | Attending: Emergency Medicine | Admitting: Emergency Medicine

## 2017-06-21 DIAGNOSIS — J449 Chronic obstructive pulmonary disease, unspecified: Secondary | ICD-10-CM | POA: Insufficient documentation

## 2017-06-21 DIAGNOSIS — F17210 Nicotine dependence, cigarettes, uncomplicated: Secondary | ICD-10-CM | POA: Insufficient documentation

## 2017-06-21 DIAGNOSIS — I509 Heart failure, unspecified: Secondary | ICD-10-CM | POA: Insufficient documentation

## 2017-06-21 DIAGNOSIS — Z79899 Other long term (current) drug therapy: Secondary | ICD-10-CM | POA: Insufficient documentation

## 2017-06-21 DIAGNOSIS — D631 Anemia in chronic kidney disease: Secondary | ICD-10-CM | POA: Diagnosis not present

## 2017-06-21 DIAGNOSIS — Z992 Dependence on renal dialysis: Secondary | ICD-10-CM | POA: Diagnosis present

## 2017-06-21 DIAGNOSIS — N186 End stage renal disease: Secondary | ICD-10-CM | POA: Diagnosis not present

## 2017-06-21 DIAGNOSIS — I132 Hypertensive heart and chronic kidney disease with heart failure and with stage 5 chronic kidney disease, or end stage renal disease: Secondary | ICD-10-CM | POA: Diagnosis not present

## 2017-06-21 DIAGNOSIS — E875 Hyperkalemia: Secondary | ICD-10-CM | POA: Diagnosis not present

## 2017-06-21 DIAGNOSIS — R601 Generalized edema: Secondary | ICD-10-CM | POA: Diagnosis not present

## 2017-06-21 LAB — RENAL FUNCTION PANEL
ANION GAP: 7 (ref 5–15)
Albumin: 2 g/dL — ABNORMAL LOW (ref 3.5–5.0)
BUN: 30 mg/dL — ABNORMAL HIGH (ref 6–20)
CHLORIDE: 102 mmol/L (ref 101–111)
CO2: 29 mmol/L (ref 22–32)
Calcium: 7.9 mg/dL — ABNORMAL LOW (ref 8.9–10.3)
Creatinine, Ser: 4.8 mg/dL — ABNORMAL HIGH (ref 0.61–1.24)
GFR calc non Af Amer: 12 mL/min — ABNORMAL LOW (ref >60)
GFR, EST AFRICAN AMERICAN: 14 mL/min — AB (ref >60)
Glucose, Bld: 95 mg/dL (ref 65–99)
POTASSIUM: 3.8 mmol/L (ref 3.5–5.1)
Phosphorus: 2.4 mg/dL — ABNORMAL LOW (ref 2.5–4.6)
Sodium: 138 mmol/L (ref 135–145)

## 2017-06-21 LAB — CBC
HEMATOCRIT: 19.8 % — AB (ref 40.0–52.0)
Hemoglobin: 7 g/dL — ABNORMAL LOW (ref 13.0–18.0)
MCH: 31.4 pg (ref 26.0–34.0)
MCHC: 35.3 g/dL (ref 32.0–36.0)
MCV: 88.9 fL (ref 80.0–100.0)
PLATELETS: 394 10*3/uL (ref 150–440)
RBC: 2.23 MIL/uL — AB (ref 4.40–5.90)
RDW: 18.3 % — ABNORMAL HIGH (ref 11.5–14.5)
WBC: 6.3 10*3/uL (ref 3.8–10.6)

## 2017-06-21 MED ORDER — LIDOCAINE-PRILOCAINE 2.5-2.5 % EX CREA: 1.0000 "application " | TOPICAL_CREAM | CUTANEOUS | Status: DC | PRN

## 2017-06-21 MED ORDER — HEPARIN SODIUM (PORCINE) 1000 UNIT/ML DIALYSIS
1000.0000 [IU] | INTRAMUSCULAR | Status: DC | PRN
  Filled 2017-06-21: qty 1

## 2017-06-21 MED ORDER — ALTEPLASE 2 MG IJ SOLR
2.0000 mg | Freq: Once | INTRAMUSCULAR | Status: DC | PRN
  Filled 2017-06-21: qty 2

## 2017-06-21 MED ORDER — SODIUM CHLORIDE 0.9 % IV SOLN: 100.0000 mL | INTRAVENOUS | Status: DC | PRN

## 2017-06-21 MED ORDER — LIDOCAINE HCL (PF) 1 % IJ SOLN: 5.0000 mL | INTRAMUSCULAR | Status: DC | PRN

## 2017-06-21 MED ORDER — PENTAFLUOROPROP-TETRAFLUOROETH EX AERO: 1.0000 "application " | INHALATION_SPRAY | CUTANEOUS | Status: DC | PRN

## 2017-06-21 NOTE — ED Triage Notes (Signed)
Pt is here for his routine dialysis. Pt denies any pain or discomfort

## 2017-06-21 NOTE — ED Notes (Signed)
Pt on cell phone talking and walking out refusing to get discharge paperwork. Pt advised of needing his vitals rechecked and discharge instructions. Pt states he doesn't need that stuff. Pt leaves. MD aware

## 2017-06-21 NOTE — ED Provider Notes (Signed)
[facility] Emergency Department Provider Note       Time seen: ----------------------------------------- 10:19 AM on 06/21/2017 -----------------------------------------   I have reviewed the triage vital signs and the nursing notes.  HISTORY   Chief Complaint Dialysis    HPI Stanley [name] is a 57 y.o. male with a history of Alcohol abuse, CHF, cirrhosis, drug abuse and end-stage renal disease on dialysis who presents to the ED for his has normal dialysis. Patient denies any complaints, reports she has been going to dialysis normally, has been taking his medications as prescribed.  Past Medical History:  Diagnosis Date  . Alcohol abuse   . CHF (congestive heart failure) ([place])   . Cirrhosis ([place])   . Coronary artery disease 2009  . Drug abuse ([place])   . End stage renal disease on dialysis [facility]) NEPHROLOGIST-   DR [facility]  IN [place]   HEMODIALYSIS --   TUES/  THURS/  SAT  . Gastrointestinal bleed 06/13/2017   From chart...hx of multiple GI bleeds  . GERD (gastroesophageal reflux disease)   . Hyperlipidemia   . Hypertension   . PAD (peripheral artery disease) ([place])   . Renal insufficiency    Per pt, 32 oz fluid restriction per day  . S/P triple vessel bypass 06/09/2016   2009ish  . Suicidal ideation    & HOMICIDAL IDEATION --  06-16-2013   ADMITTED TO BEHAVIOR HEALTH    Patient Active Problem List   Diagnosis Date Noted  . Protein-calorie malnutrition, severe 06/14/2017  . Encounter for dialysis [facility])   . Palliative care by specialist   . Goals of care, counseling/discussion   . Malnutrition of moderate degree 06/05/2017  . Secondary esophageal varices without bleeding ([place])   . Stomach irritation   . Idiopathic esophageal varices without bleeding ([place])   . Alcoholic hepatitis with ascites 05/24/2017  . ESRD (end stage renal disease) ([place]) 04/28/2017  . Uremia 03/08/2017  . ESRD on hemodialysis ([place]) 03/03/2017  . Weakness  02/28/2017  . Hypocalcemia 02/22/2017  . Shortness of breath 11/26/2016  . COPD (chronic obstructive pulmonary disease) ([place]) 10/30/2016  . COPD exacerbation ([place]) 10/29/2016  . Anemia   . Heme positive stool   . Ulceration of intestine   . Benign neoplasm of transverse colon   . Acute gastrointestinal hemorrhage   . Esophageal candidiasis ([place])   . Angiodysplasia of intestinal tract   . Acute respiratory failure with hypoxia ([place]) 07/03/2016  . GI bleeding 06/24/2016  . Rectal bleeding 06/14/2016  . Anemia of chronic disease 06/01/2016  . MRSA carrier 06/01/2016  . Chronic renal failure 05/23/2016  . Ischemic heart disease 05/23/2016  . Angiodysplasia of small intestine   . Melena   . Small bowel bleed not requiring more than 4 units of blood in 24 hours, ICU, or surgery   . Anemia due to chronic blood loss   . Abdominal pain 05/05/2016  . Acute posthemorrhagic anemia 04/17/2016  . Gastrointestinal bleed 04/17/2016  . History of esophagogastroduodenoscopy (EGD) 04/17/2016  . Elevated troponin 04/17/2016  . Alcohol abuse 04/17/2016  . Upper GI bleed 01/19/2016  . Blood in stool   . Angiodysplasia of stomach and duodenum with hemorrhage   . Gastritis   . Reflux esophagitis   . GI bleed 05/16/2015  . Acute GI bleeding   . Symptomatic anemia 04/30/2015  . HTN (hypertension) 04/06/2015  . GERD (gastroesophageal reflux disease) 04/06/2015  . HLD (hyperlipidemia) 04/06/2015  . Dyspnea 04/06/2015  . Cirrhosis  of liver with ascites ([place]) 04/06/2015  . Ascites 04/06/2015  . GIB (gastrointestinal bleeding) 03/23/2015  . Homicidal ideation 06/19/2013  . Suicidal intent 06/19/2013  . Homicidal ideations 06/19/2013  . Hyperkalemia 06/16/2013  . Mandible fracture ([place]) 06/05/2013  . Fracture, mandible ([place]) 06/02/2013  . Coronary atherosclerosis of native coronary artery 06/02/2013  . ESRD on dialysis ([place]) 06/02/2013  . Mandible open fracture ([place]) 06/02/2013    Past  Surgical History:  Procedure Laterality Date  . A/V FISTULAGRAM Right 06/06/2017   Procedure: A/V FISTULAGRAM;  Surgeon: Katha Cabal, MD;  Location: [place] CV LAB;  Service: Cardiovascular;  Laterality: Right;  . A/V SHUNT INTERVENTION N/A 06/06/2017   Procedure: A/V SHUNT INTERVENTION;  Surgeon: Katha Cabal, MD;  Location: [place] CV LAB;  Service: Cardiovascular;  Laterality: N/A;  . AGILE CAPSULE N/A 06/19/2016   Procedure: AGILE CAPSULE;  Surgeon: [name], MD;  Location: [facility] ENDOSCOPY;  Service: Endoscopy;  Laterality: N/A;  . COLONOSCOPY WITH PROPOFOL N/A 06/18/2016   Procedure: COLONOSCOPY WITH PROPOFOL;  Surgeon: [name], MD;  Location: [facility] ENDOSCOPY;  Service: Endoscopy;  Laterality: N/A;  . COLONOSCOPY WITH PROPOFOL N/A 08/12/2016   Procedure: COLONOSCOPY WITH PROPOFOL;  Surgeon: [name], MD;  Location[facility] ENDOSCOPY;  Service: Endoscopy;  Laterality: N/A;  . COLONOSCOPY WITH PROPOFOL N/A 05/05/2017   Procedure: COLONOSCOPY WITH PROPOFOL;  Surgeon: Manya Silvas, MD;  Location: [facility] ENDOSCOPY;  Service: Endoscopy;  Laterality: N/A;  . CORONARY ANGIOPLASTY  ?   PT UNABLE TO TELL IF  BEFORE OR AFTER  CABG  . CORONARY ARTERY BYPASS GRAFT  2008  ([place] , [place])   3 VESSEL  . DIALYSIS FISTULA CREATION  LAST SURGERY  APPOX  2008  . ENTEROSCOPY N/A 05/10/2016   Procedure: ENTEROSCOPY;  Surgeon: Jerene Bears, MD;  Location: [place];  Service: Gastroenterology;  Laterality: N/A;  . ENTEROSCOPY N/A 08/12/2016   Procedure: ENTEROSCOPY;  Surgeon: [name], MD;  Location: [facility] ENDOSCOPY;  Service: Endoscopy;  Laterality: N/A;  . ENTEROSCOPY Left 06/03/2017   Procedure: ENTEROSCOPY;  Surgeon: Virgel Manifold, MD;  Location: [facility] ENDOSCOPY;  Service: Endoscopy;  Laterality: Left;  Procedure date will ultimately depend on when patient is medically optimized before the procedure, pending hemodialysis and blood transfusions etc. Will place on schedule and  change depending on clinical status.   . ENTEROSCOPY N/A 06/05/2017   Procedure: ENTEROSCOPY;  Surgeon: Virgel Manifold, MD;  Location: [facility] ENDOSCOPY;  Service: Endoscopy;  Laterality: N/A;  . ENTEROSCOPY N/A 06/15/2017   Procedure: Push ENTEROSCOPY;  Surgeon: [name], MD;  Location: [facility] ENDOSCOPY;  Service: Endoscopy;  Laterality: N/A;  . ESOPHAGOGASTRODUODENOSCOPY N/A 05/07/2015   Procedure: ESOPHAGOGASTRODUODENOSCOPY (EGD);  Surgeon: Hulen Luster, MD;  Location[facility] ENDOSCOPY;  Service: Endoscopy;  Laterality: N/A;  . ESOPHAGOGASTRODUODENOSCOPY (EGD) WITH PROPOFOL N/A 05/17/2015   Procedure: ESOPHAGOGASTRODUODENOSCOPY (EGD) WITH PROPOFOL;  Surgeon: [name], MD;  Location: [facility] ENDOSCOPY;  Service: Endoscopy;  Laterality: N/A;  . ESOPHAGOGASTRODUODENOSCOPY (EGD) WITH PROPOFOL N/A 01/20/2016   Procedure: ESOPHAGOGASTRODUODENOSCOPY (EGD) WITH PROPOFOL;  Surgeon: [name], MD;  Location: [facility] ENDOSCOPY;  Service: Endoscopy;  Laterality: N/A;  . ESOPHAGOGASTRODUODENOSCOPY (EGD) WITH PROPOFOL N/A 04/17/2016   Procedure: ESOPHAGOGASTRODUODENOSCOPY (EGD) WITH PROPOFOL;  Surgeon: Lin Landsman, MD;  Location: [facility] ENDOSCOPY;  Service: Gastroenterology;  Laterality: N/A;  . ESOPHAGOGASTRODUODENOSCOPY (EGD) WITH PROPOFOL  05/09/2016   Procedure: ESOPHAGOGASTRODUODENOSCOPY (EGD) WITH PROPOFOL;  Surgeon: Jerene Bears, MD;  Location: [place];  Service:  Endoscopy;;  . ESOPHAGOGASTRODUODENOSCOPY (EGD) WITH PROPOFOL N/A 06/16/2016   Procedure: ESOPHAGOGASTRODUODENOSCOPY (EGD) WITH PROPOFOL;  Surgeon: [name], MD;  Location: [facility] ENDOSCOPY;  Service: Endoscopy;  Laterality: N/A;  . ESOPHAGOGASTRODUODENOSCOPY (EGD) WITH PROPOFOL N/A 05/05/2017   Procedure: ESOPHAGOGASTRODUODENOSCOPY (EGD) WITH PROPOFOL;  Surgeon: Manya Silvas, MD;  Location: [facility] ENDOSCOPY;  Service: Endoscopy;  Laterality: N/A;  . ESOPHAGOGASTRODUODENOSCOPY (EGD) WITH PROPOFOL N/A 06/15/2017   Procedure:  ESOPHAGOGASTRODUODENOSCOPY (EGD) WITH PROPOFOL;  Surgeon: [name], MD;  Location: [facility] ENDOSCOPY;  Service: Endoscopy;  Laterality: N/A;  . GIVENS CAPSULE STUDY N/A 05/07/2016   Procedure: GIVENS CAPSULE STUDY;  Surgeon: Doran Stabler, MD;  Location: [place];  Service: Endoscopy;  Laterality: N/A;  . MANDIBULAR HARDWARE REMOVAL N/A 07/29/2013   Procedure: REMOVAL OF ARCH BARS;  Surgeon: Theodoro Kos, DO;  Location: [place];  Service: Plastics;  Laterality: N/A;  . ORIF MANDIBULAR FRACTURE N/A 06/05/2013   Procedure: REPAIR OF MANDIBULAR FRACTURE x 2 with maxillo-mandibular fixation ;  Surgeon: Theodoro Kos, DO;  Location: [place];  Service: Plastics;  Laterality: N/A;  . PERIPHERAL ARTERIAL STENT GRAFT Left     Allergies Patient has no known allergies.  Social History Social History   Tobacco Use  . Smoking status: Current Every Day Smoker    Packs/day: 0.15    Years: 40.00    Pack years: 6.00    Types: Cigarettes  . Smokeless tobacco: Never Used  Substance Use Topics  . Alcohol use: No    Comment: pt reports quitting after learning about cirrhosis  . Drug use: No    Frequency: 7.0 times per week    Types: Marijuana, Cocaine   Review of Systems Constitutional: Negative for fever. Cardiovascular: Negative for chest pain. Respiratory: Negative for shortness of breath. Gastrointestinal: Negative for abdominal pain, vomiting and diarrhea. Musculoskeletal: Negative for back pain. Skin: Negative for rash. Neurological: Negative for headaches, focal weakness or numbness.  All systems negative/normal/unremarkable except as stated in the HPI  ____________________________________________   PHYSICAL EXAM:  VITAL SIGNS: ED Triage Vitals  Enc Vitals Group     BP 06/21/17 1005 111/75     Pulse Rate 06/21/17 1005 84     Resp 06/21/17 1005 16     Temp 06/21/17 1005 97.6 F (36.4 C)     Temp Source 06/21/17 1005 Oral     SpO2 06/21/17 1005 99 %      Weight --      Height 06/21/17 1007 6\' 3"  (1.905 m)     Head Circumference --      Peak Flow --      Pain Score 06/21/17 1006 0     Pain Loc --      Pain Edu? --      Excl. in [place]? --    Constitutional: Alert and oriented. Well appearing and in no distress. Eyes: Conjunctivae are normal. Normal extraocular movements. Cardiovascular: Normal rate, regular rhythm. No murmurs, rubs, or gallops.AV fistula with thrill and bruit Respiratory: Normal respiratory effort without tachypnea nor retractions. Breath sounds are clear and equal bilaterally. No wheezes/rales/rhonchi. Gastrointestinal: Soft and nontender. Normal bowel sounds Musculoskeletal: Nontender with normal range of motion in extremities. No lower extremity tenderness nor edema. Neurologic:  Normal speech and language. No gross focal neurologic deficits are appreciated.  Skin:  Skin is warm, dry and intact. No rash noted. Psychiatric: Mood and affect are normal. Speech and behavior are normal.  ____________________________________________  ED COURSE:  As part  of my medical decision making, I reviewed the following data within the [place] History obtained from family if available, nursing notes, old chart and ekg, as well as notes from prior ED visits. Patient presented for Dialysis, He appears medically cleared for dialysis at this time.   Procedures ____________________________________________  DIFFERENTIAL DIAGNOSIS   End-stage renal disease, electrolyte abnormality   FINAL ASSESSMENT AND PLAN  End-stage renal disease on dialysis    Plan: The patient had presented for dialysis. He appears medically stable for same at this time. No need for further testing in the ED.   Laurence Aly, MD   Note: This note was generated in part or whole with voice recognition software. Voice recognition is usually quite accurate but there are transcription errors that can and very often do occur. I apologize  for any typographical errors that were not detected and corrected.    Earleen Newport, MD 06/21/17 1043

## 2017-06-21 NOTE — Progress Notes (Signed)
[facility]  ROUNDING NOTE   Subjective:   Seen and examined on hemodialysis. Tolerating treatment well.     HEMODIALYSIS FLOWSHEET:  Blood Flow Rate (mL/min): (P) 400 mL/min Arterial Pressure (mmHg): (P) -160 mmHg Venous Pressure (mmHg): (P) 130 mmHg Transmembrane Pressure (mmHg): (P) 70 mmHg Ultrafiltration Rate (mL/min): (P) 0.86 mL/min Dialysate Flow Rate (mL/min): (P) 800 ml/min Conductivity: Machine : (P) 13.6 Conductivity: Machine : (P) 13.6 Dialysis Fluid Bolus: Normal Saline Bolus Amount (mL): 250 mL Dialysate Change: 2K    Objective:  Vital signs in last 24 hours:  Temp:  [97.6 F (36.4 C)] 97.6 F (36.4 C) (04/10 1005) Pulse Rate:  [78-84] 78 (04/10 1100) Resp:  [16-20] 16 (04/10 1315) BP: (111-139)/(75-96) 139/96 (04/10 1315) SpO2:  [99 %] 99 % (04/10 1005)  Weight change:  There were no vitals filed for this visit.  Intake/Output: No intake/output data recorded.   Intake/Output this shift:  No intake/output data recorded.  Physical Exam: General: NAD  Head: Normocephalic, atraumatic. Moist oral mucosal membranes  Eyes: Anicteric  Neck: Supple  Lungs:  Clear to auscultation, normal effort  Heart: Regular rate and rhythm  Abdomen:  Soft, nontender, distended  Extremities: 2+ peripheral edema.  Neurologic: Nonfocal, moving all four extremities  Skin: No lesions  Access: RUE AVF - no bruit or thrill, RIJ permcath    Basic Metabolic Panel: Recent Labs  Lab 06/15/17 0152 06/16/17 0520 06/21/17 1100  NA 139 138 138  K 4.4 5.6* 3.8  CL 101 100* 102  CO2 29 27 29   GLUCOSE 129* 100* 95  BUN 33* 49* 30*  CREATININE 4.39* 5.89* 4.80*  CALCIUM 7.0* 7.1* 7.9*  PHOS  --   --  2.4*    Liver Function Tests: Recent Labs  Lab 06/21/17 1100  ALBUMIN 2.0*   No results for input(s): LIPASE, AMYLASE in the last 168 hours. No results for input(s): AMMONIA in the last 168 hours.  CBC: Recent Labs  Lab 06/14/17 2004 06/15/17 0152  06/15/17 0839 06/16/17 0520 06/21/17 1100  WBC  --  10.5  --  10.0 6.3  HGB 6.9* 6.9* 7.0* 7.4* 7.0*  HCT 20.8* 20.9* 21.8* 21.4* 19.8*  MCV  --  89.4  --  88.4 88.9  PLT  --  304  --  342 394    Cardiac Enzymes: No results for input(s): CKTOTAL, CKMB, CKMBINDEX, TROPONINI in the last 168 hours.  BNP: Invalid input(s): POCBNP  CBG: No results for input(s): GLUCAP in the last 168 hours.  Microbiology: Results for orders placed or performed during the hospital encounter of 06/02/17  MRSA PCR Screening     Status: None   Collection Time: 06/02/17  4:22 PM  Result Value Ref Range Status   MRSA by PCR NEGATIVE NEGATIVE Final    Comment:        The GeneXpert MRSA Assay (FDA approved for NASAL specimens only), is one component of a comprehensive MRSA colonization surveillance program. It is not intended to diagnose MRSA infection nor to guide or monitor treatment for MRSA infections. Performed at [facility], [address], [place], [place] [address]   Body fluid culture     Status: None   Collection Time: 06/05/17 10:28 AM  Result Value Ref Range Status   Specimen Description   Final    PERITONEAL Performed at [facility], [address], [place], [place] [address]    Special Requests   Final    NONE Performed at  [place], [place] [address]    Gram Stain   Final    WBC PRESENT,BOTH PMN AND MONONUCLEAR NO ORGANISMS SEEN CYTOSPIN SMEAR    Culture   Final    NO GROWTH 3 DAYS Performed at [facility], [place] [address] [place], [place] [address]    Report Status 06/08/2017 FINAL  Final    Coagulation Studies: No results for input(s): LABPROT, INR in the last 72 hours.  Urinalysis: No results for input(s): COLORURINE, LABSPEC, PHURINE, GLUCOSEU, HGBUR, BILIRUBINUR, KETONESUR, PROTEINUR, UROBILINOGEN, NITRITE, LEUKOCYTESUR in the last 72 hours.  Invalid input(s): APPERANCEUR    Imaging: No  results found.   Medications:       Assessment/ Plan:  [name] is a 57 y.o. black male withend stage renal disease on hemodialysis secondary to Alport's syndrome, ascites, hypertension, anemia of chronic kidney disease, coronary artery disease, peripheral vascular disease, hyperlipidemia, gastrointestinal AVMs, pulmonary hypertension,   nooutpatientdialysis unit currently  1.  Hyperkalemia Improved with HD  2.  End-stage renal disease: MWF schedule.  Seen and examined on hemodialysis. Tolerating treatment well.   3. Anemia with chronic kidney disease: acute blood loss from GI bleed/epistaxis.    EPO with HD  4. Dialysis device complication: AVF nonfunction.  Needs evaluation for a new access.   5. Edema - volume removal with HD as tolerated      LOS: 0 Stanley [name] 4/10/20195:07 PM

## 2017-06-21 NOTE — Progress Notes (Signed)
   06/21/17 1423  Vital Signs  Pulse Rate 86  Resp 18  Oxygen Therapy  SpO2 99 %  Post-Hemodialysis Assessment  Rinseback Volume (mL) 250 mL  Dialyzer Clearance Lightly streaked  Duration of HD Treatment -hour(s) 3.5 hour(s)  Hemodialysis Intake (mL) 500 mL  UF Total -Machine (mL) 3000 mL  Net UF (mL) 2500 mL  Tolerated HD Treatment Yes  Post-Hemodialysis Comments Pt. tolerated hemodialysis without complications  Education / Care Plan  Dialysis Education Provided Yes  Hemodialysis Catheter Right Internal jugular Double-lumen  No Placement Date or Time found.   Placed prior to admission: Yes  Orientation: Right  Access Location: Internal jugular  Hemodialysis Catheter Type: Double-lumen  Site Condition No complications  Blue Lumen Status Capped (Central line)  Red Lumen Status Capped (Central line)  Catheter fill solution Heparin 1000 units/ml  Catheter fill volume (Arterial) 1.5 cc  Catheter fill volume (Venous) 1.5  Dressing Type Biopatch  Dressing Status Clean;Dry;Intact  Interventions New dressing  Drainage Description None  Dressing Change Due 06/28/17  Post treatment catheter status Capped and Clamped

## 2017-06-21 NOTE — ED Notes (Signed)
Pt transported to dialysis at this time by this RN  

## 2017-06-21 NOTE — ED Provider Notes (Signed)
[facility] Emergency Department Provider Note  ____________________________________________  Time seen: Approximately 3:09 PM  I have reviewed the triage vital signs and the nursing notes.   HISTORY  Chief Complaint Dialysis    HPI Stanley [name] is a 57 y.o. male sent back to the ED after receiving outpatient dialysis at the hospital today for reassessment and disposition. Patient reports feeling fine. He is ambulatory, baseline health, no complaints. Once to go home. Reports dialysis was uncomplicated without any discomfort.      Past Medical History:  Diagnosis Date  . Alcohol abuse   . CHF (congestive heart failure) ([place])   . Cirrhosis ([place])   . Coronary artery disease 2009  . Drug abuse ([place])   . End stage renal disease on dialysis [facility]) NEPHROLOGIST-   DR [facility]  IN [place]   HEMODIALYSIS --   TUES/  THURS/  SAT  . Gastrointestinal bleed 06/13/2017   From chart...hx of multiple GI bleeds  . GERD (gastroesophageal reflux disease)   . Hyperlipidemia   . Hypertension   . PAD (peripheral artery disease) ([place])   . Renal insufficiency    Per pt, 32 oz fluid restriction per day  . S/P triple vessel bypass 06/09/2016   2009ish  . Suicidal ideation    & HOMICIDAL IDEATION --  06-16-2013   ADMITTED TO BEHAVIOR HEALTH     Patient Active Problem List   Diagnosis Date Noted  . Protein-calorie malnutrition, severe 06/14/2017  . Encounter for dialysis [facility])   . Palliative care by specialist   . Goals of care, counseling/discussion   . Malnutrition of moderate degree 06/05/2017  . Secondary esophageal varices without bleeding ([place])   . Stomach irritation   . Idiopathic esophageal varices without bleeding ([place])   . Alcoholic hepatitis with ascites 05/24/2017  . ESRD (end stage renal disease) ([place]) 04/28/2017  . Uremia 03/08/2017  . ESRD on hemodialysis ([place]) 03/03/2017  . Weakness 02/28/2017  . Hypocalcemia 02/22/2017  . Shortness of  breath 11/26/2016  . COPD (chronic obstructive pulmonary disease) ([place]) 10/30/2016  . COPD exacerbation ([place]) 10/29/2016  . Anemia   . Heme positive stool   . Ulceration of intestine   . Benign neoplasm of transverse colon   . Acute gastrointestinal hemorrhage   . Esophageal candidiasis ([place])   . Angiodysplasia of intestinal tract   . Acute respiratory failure with hypoxia ([place]) 07/03/2016  . GI bleeding 06/24/2016  . Rectal bleeding 06/14/2016  . Anemia of chronic disease 06/01/2016  . MRSA carrier 06/01/2016  . Chronic renal failure 05/23/2016  . Ischemic heart disease 05/23/2016  . Angiodysplasia of small intestine   . Melena   . Small bowel bleed not requiring more than 4 units of blood in 24 hours, ICU, or surgery   . Anemia due to chronic blood loss   . Abdominal pain 05/05/2016  . Acute posthemorrhagic anemia 04/17/2016  . Gastrointestinal bleed 04/17/2016  . History of esophagogastroduodenoscopy (EGD) 04/17/2016  . Elevated troponin 04/17/2016  . Alcohol abuse 04/17/2016  . Upper GI bleed 01/19/2016  . Blood in stool   . Angiodysplasia of stomach and duodenum with hemorrhage   . Gastritis   . Reflux esophagitis   . GI bleed 05/16/2015  . Acute GI bleeding   . Symptomatic anemia 04/30/2015  . HTN (hypertension) 04/06/2015  . GERD (gastroesophageal reflux disease) 04/06/2015  . HLD (hyperlipidemia) 04/06/2015  . Dyspnea 04/06/2015  . Cirrhosis of liver with ascites ([place]) 04/06/2015  .  Ascites 04/06/2015  . GIB (gastrointestinal bleeding) 03/23/2015  . Homicidal ideation 06/19/2013  . Suicidal intent 06/19/2013  . Homicidal ideations 06/19/2013  . Hyperkalemia 06/16/2013  . Mandible fracture ([place]) 06/05/2013  . Fracture, mandible ([place]) 06/02/2013  . Coronary atherosclerosis of native coronary artery 06/02/2013  . ESRD on dialysis ([place]) 06/02/2013  . Mandible open fracture ([place]) 06/02/2013     Past Surgical History:  Procedure Laterality Date  . A/V  FISTULAGRAM Right 06/06/2017   Procedure: A/V FISTULAGRAM;  Surgeon: Katha Cabal, MD;  Location: [place] CV LAB;  Service: Cardiovascular;  Laterality: Right;  . A/V SHUNT INTERVENTION N/A 06/06/2017   Procedure: A/V SHUNT INTERVENTION;  Surgeon: Katha Cabal, MD;  Location: [place] CV LAB;  Service: Cardiovascular;  Laterality: N/A;  . AGILE CAPSULE N/A 06/19/2016   Procedure: AGILE CAPSULE;  Surgeon: [name], MD;  Location: [facility] ENDOSCOPY;  Service: Endoscopy;  Laterality: N/A;  . COLONOSCOPY WITH PROPOFOL N/A 06/18/2016   Procedure: COLONOSCOPY WITH PROPOFOL;  Surgeon: [name], MD;  Location: [facility] ENDOSCOPY;  Service: Endoscopy;  Laterality: N/A;  . COLONOSCOPY WITH PROPOFOL N/A 08/12/2016   Procedure: COLONOSCOPY WITH PROPOFOL;  Surgeon: [name], MD;  Location: [facility] ENDOSCOPY;  Service: Endoscopy;  Laterality: N/A;  . COLONOSCOPY WITH PROPOFOL N/A 05/05/2017   Procedure: COLONOSCOPY WITH PROPOFOL;  Surgeon: Manya Silvas, MD;  Location: [facility] ENDOSCOPY;  Service: Endoscopy;  Laterality: N/A;  . CORONARY ANGIOPLASTY  ?   PT UNABLE TO TELL IF  BEFORE OR AFTER  CABG  . CORONARY ARTERY BYPASS GRAFT  2008  ([place] , )   3 VESSEL  . DIALYSIS FISTULA CREATION  LAST SURGERY  APPOX  2008  . ENTEROSCOPY N/A 05/10/2016   Procedure: ENTEROSCOPY;  Surgeon: Jerene Bears, MD;  Location: [place];  Service: Gastroenterology;  Laterality: N/A;  . ENTEROSCOPY N/A 08/12/2016   Procedure: ENTEROSCOPY;  Surgeon: [name], MD;  Location: [facility] ENDOSCOPY;  Service: Endoscopy;  Laterality: N/A;  . ENTEROSCOPY Left 06/03/2017   Procedure: ENTEROSCOPY;  Surgeon: Virgel Manifold, MD;  Location: [facility] ENDOSCOPY;  Service: Endoscopy;  Laterality: Left;  Procedure date will ultimately depend on when patient is medically optimized before the procedure, pending hemodialysis and blood transfusions etc. Will place on schedule and change depending on clinical status.   . ENTEROSCOPY  N/A 06/05/2017   Procedure: ENTEROSCOPY;  Surgeon: Virgel Manifold, MD;  Location: [facility] ENDOSCOPY;  Service: Endoscopy;  Laterality: N/A;  . ENTEROSCOPY N/A 06/15/2017   Procedure: Push ENTEROSCOPY;  Surgeon: [name], MD;  Location: [facility] ENDOSCOPY;  Service: Endoscopy;  Laterality: N/A;  . ESOPHAGOGASTRODUODENOSCOPY N/A 05/07/2015   Procedure: ESOPHAGOGASTRODUODENOSCOPY (EGD);  Surgeon: Hulen Luster, MD;  Location: [facility] ENDOSCOPY;  Service: Endoscopy;  Laterality: N/A;  . ESOPHAGOGASTRODUODENOSCOPY (EGD) WITH PROPOFOL N/A 05/17/2015   Procedure: ESOPHAGOGASTRODUODENOSCOPY (EGD) WITH PROPOFOL;  Surgeon: [name], MD;  Location: [facility] ENDOSCOPY;  Service: Endoscopy;  Laterality: N/A;  . ESOPHAGOGASTRODUODENOSCOPY (EGD) WITH PROPOFOL N/A 01/20/2016   Procedure: ESOPHAGOGASTRODUODENOSCOPY (EGD) WITH PROPOFOL;  Surgeon: [name], MD;  Location: [facility] ENDOSCOPY;  Service: Endoscopy;  Laterality: N/A;  . ESOPHAGOGASTRODUODENOSCOPY (EGD) WITH PROPOFOL N/A 04/17/2016   Procedure: ESOPHAGOGASTRODUODENOSCOPY (EGD) WITH PROPOFOL;  Surgeon: Lin Landsman, MD;  Location: [facility] ENDOSCOPY;  Service: Gastroenterology;  Laterality: N/A;  . ESOPHAGOGASTRODUODENOSCOPY (EGD) WITH PROPOFOL  05/09/2016   Procedure: ESOPHAGOGASTRODUODENOSCOPY (EGD) WITH PROPOFOL;  Surgeon: Jerene Bears, MD;  Location: [place] ENDOSCOPY;  Service: Endoscopy;;  . ESOPHAGOGASTRODUODENOSCOPY (EGD) WITH PROPOFOL  N/A 06/16/2016   Procedure: ESOPHAGOGASTRODUODENOSCOPY (EGD) WITH PROPOFOL;  Surgeon: [name], MD;  Location: [facility] ENDOSCOPY;  Service: Endoscopy;  Laterality: N/A;  . ESOPHAGOGASTRODUODENOSCOPY (EGD) WITH PROPOFOL N/A 05/05/2017   Procedure: ESOPHAGOGASTRODUODENOSCOPY (EGD) WITH PROPOFOL;  Surgeon: Manya Silvas, MD;  Location: [facility] ENDOSCOPY;  Service: Endoscopy;  Laterality: N/A;  . ESOPHAGOGASTRODUODENOSCOPY (EGD) WITH PROPOFOL N/A 06/15/2017   Procedure: ESOPHAGOGASTRODUODENOSCOPY (EGD) WITH PROPOFOL;  Surgeon: [name],  MD;  Location: [facility] ENDOSCOPY;  Service: Endoscopy;  Laterality: N/A;  . GIVENS CAPSULE STUDY N/A 05/07/2016   Procedure: GIVENS CAPSULE STUDY;  Surgeon: Doran Stabler, MD;  Location: [place];  Service: Endoscopy;  Laterality: N/A;  . MANDIBULAR HARDWARE REMOVAL N/A 07/29/2013   Procedure: REMOVAL OF ARCH BARS;  Surgeon: Theodoro Kos, DO;  Location: [place];  Service: Plastics;  Laterality: N/A;  . ORIF MANDIBULAR FRACTURE N/A 06/05/2013   Procedure: REPAIR OF MANDIBULAR FRACTURE x 2 with maxillo-mandibular fixation ;  Surgeon: Theodoro Kos, DO;  Location: [place];  Service: Plastics;  Laterality: N/A;  . PERIPHERAL ARTERIAL STENT GRAFT Left      Prior to Admission medications   Medication Sig Start Date End Date Taking? Authorizing Provider  albuterol (PROVENTIL HFA;VENTOLIN HFA) 108 (90 Base) MCG/ACT inhaler Inhale 2 puffs into the lungs every 6 (six) hours as needed for wheezing or shortness of breath. 04/21/17   Lisa Roca, MD  alum & mag hydroxide-simeth (MAALOX/MYLANTA) 200-200-20 MG/5ML suspension Take 15 mLs every 4 (four) hours as needed by mouth for indigestion or heartburn. Patient not taking: Reported on 04/19/2017 01/25/17   Demetrios Loll, MD  atorvastatin (LIPITOR) 40 MG tablet Take 1 tablet (40 mg total) by mouth daily. 11/16/16   Vaughan Basta, MD  budesonide-formoterol (SYMBICORT) 160-4.5 MCG/ACT inhaler Inhale 2 puffs into the lungs daily. 11/16/16   Vaughan Basta, MD  calcium acetate (PHOSLO) 667 MG capsule Take 2,001 mg by mouth 3 (three) times daily.    [provider]  ferrous sulfate 325 (65 FE) MG tablet Take 1 tablet by mouth 3 (three) times daily. 01/21/17   [provider]  furosemide (LASIX) 80 MG tablet Take 1 tablet (80 mg total) daily by mouth. 01/25/17   Demetrios Loll, MD  gabapentin (NEURONTIN) 300 MG capsule Take 1 capsule by mouth daily as directed 08/16/16   [provider]  ipratropium-albuterol  (DUONEB) 0.5-2.5 (3) MG/3ML SOLN Take 3 mLs by nebulization every 6 (six) hours as needed. 11/28/16   Loletha Grayer, MD  labetalol (NORMODYNE) 100 MG tablet Take 100 mg by mouth daily. 03/24/17   [provider]  Multiple Vitamins-Minerals-FA (DIALYVITE SUPREME D) 3 MG TABS Take 1 tablet (3 mg total) by mouth daily. 11/28/16   Loletha Grayer, MD  nitroGLYCERIN (NITROSTAT) 0.4 MG SL tablet Place 1 tablet (0.4 mg total) under the tongue every 5 (five) minutes as needed. 04/12/16   Wende Bushy, MD  pantoprazole (PROTONIX) 40 MG tablet Take 1 tablet (40 mg total) by mouth daily. 11/28/16   Loletha Grayer, MD  Spacer/Aero Chamber Mouthpiece MISC 1 Units by Does not apply route every 4 (four) hours as needed (wheezing). 02/19/17   Darel Hong, MD  tiotropium (SPIRIVA HANDIHALER) 18 MCG inhalation capsule Place 1 capsule (18 mcg total) into inhaler and inhale daily. 11/16/16 11/16/17  Vaughan Basta, MD     Allergies Patient has no known allergies.   Family History  Problem Relation Age of Onset  . Colon cancer Mother   . Cancer  Father   . Cancer Sister   . Kidney disease Brother     Social History Social History   Tobacco Use  . Smoking status: Current Every Day Smoker    Packs/day: 0.15    Years: 40.00    Pack years: 6.00    Types: Cigarettes  . Smokeless tobacco: Never Used  Substance Use Topics  . Alcohol use: No    Comment: pt reports quitting after learning about cirrhosis  . Drug use: No    Frequency: 7.0 times per week    Types: Marijuana, Cocaine    Review of Systems  Constitutional:   No fever or chills.  ENT:   No sore throat. No rhinorrhea. Cardiovascular:   No chest pain or syncope. Respiratory:   No dyspnea or cough. Gastrointestinal:   Negative for abdominal pain, vomiting and diarrhea.  Musculoskeletal:   Negative for focal pain or swelling All other systems reviewed and are negative except as documented above in ROS and  HPI.  ____________________________________________   PHYSICAL EXAM:  VITAL SIGNS: ED Triage Vitals  Enc Vitals Group     BP 06/21/17 1005 111/75     Pulse Rate 06/21/17 1005 84     Resp 06/21/17 1005 16     Temp 06/21/17 1005 97.6 F (36.4 C)     Temp Source 06/21/17 1005 Oral     SpO2 06/21/17 1005 99 %     Weight --      Height 06/21/17 1007 6\' 3"  (1.905 m)     Head Circumference --      Peak Flow --      Pain Score 06/21/17 1006 0     Pain Loc --      Pain Edu? --      Excl. in [place]? --     Vital signs reviewed, nursing assessments reviewed.   Constitutional:   Alert and oriented. Well appearing and in no distress. Eyes:   Conjunctivae are normal. EOMI.  ENT      Head:   Normocephalic and atraumatic.      Nose:   No congestion/rhinnorhea.       Mouth/Throat:   MMM       Neck:   No meningismus. Full ROM. Hematological/Lymphatic/Immunilogical:   No cervical lymphadenopathy. Cardiovascular:   RRR. Symmetric bilateral radial and DP pulses.  No murmurs.  Respiratory:   Normal respiratory effort without tachypnea/retractions. Breath sounds are clear and equal bilaterally. No wheezes/rales/rhonchi. Musculoskeletal:   Normal range of motion in all extremities.  Neurologic:   Normal speech and language.  Motor grossly intact. No acute focal neurologic deficits are appreciated.  Skin:    Skin is warm, dry and intact.no inflammatory changes around his tunneled right IJ catheter that she is for dialysis. It's been flushed and appears clean and well wrapped. ____________________________________________    LABS (pertinent positives/negatives) (all labs ordered are listed, but only abnormal results are displayed) Labs Reviewed  RENAL FUNCTION PANEL - Abnormal; Notable for the following components:      Result Value   BUN 30 (*)    Creatinine, Ser 4.80 (*)    Calcium 7.9 (*)    Phosphorus 2.4 (*)    Albumin 2.0 (*)    GFR calc non Af Amer 12 (*)    GFR calc Af Amer 14 (*)     All other components within normal limits  CBC - Abnormal; Notable for the following components:   RBC 2.23 (*)    Hemoglobin  7.0 (*)    HCT 19.8 (*)    RDW 18.3 (*)    All other components within normal limits   ____________________________________________   EKG    ____________________________________________    RADIOLOGY  No results found.  ____________________________________________   PROCEDURES Procedures  ____________________________________________    CLINICAL IMPRESSION / ASSESSMENT AND PLAN / ED COURSE  Pertinent labs & imaging results that were available during my care of the patient were reviewed by me and considered in my medical decision making (see chart for details).    patient well-appearing no acute distress, at usual health baseline. he received dialysis uneventfully and suitable for discharge home.      ____________________________________________   FINAL CLINICAL IMPRESSION(S) / ED DIAGNOSES    Final diagnoses:  End stage renal disease on dialysis [facility])     ED Discharge Orders    None      Portions of this note were generated with dragon dictation software. Dictation errors may occur despite best attempts at proofreading.    Carrie Mew, MD 06/21/17 [phone]

## 2017-06-23 ENCOUNTER — Telehealth: Payer: Self-pay

## 2017-06-23 NOTE — Telephone Encounter (Signed)
Flagged on EMMI report for not having a follow up scheduled.  1st attempt to reach patient made 06/23/17 at 1:50pm, though unable to leave a voice mail due to the patient's mailbox not being set up.  Will attempt at a later time.

## 2017-06-26 ENCOUNTER — Emergency Department
Admission: EM | Admit: 2017-06-26 | Discharge: 2017-06-26 | Disposition: A | Discharge status: Home / Self Care | Payer: Medicare Other | Attending: Emergency Medicine | Admitting: Emergency Medicine

## 2017-06-26 ENCOUNTER — Other Ambulatory Visit: Payer: Self-pay

## 2017-06-26 ENCOUNTER — Encounter: Payer: Self-pay | Admitting: Emergency Medicine

## 2017-06-26 DIAGNOSIS — I509 Heart failure, unspecified: Secondary | ICD-10-CM | POA: Diagnosis not present

## 2017-06-26 DIAGNOSIS — J449 Chronic obstructive pulmonary disease, unspecified: Secondary | ICD-10-CM | POA: Insufficient documentation

## 2017-06-26 DIAGNOSIS — F17210 Nicotine dependence, cigarettes, uncomplicated: Secondary | ICD-10-CM | POA: Insufficient documentation

## 2017-06-26 DIAGNOSIS — Z992 Dependence on renal dialysis: Secondary | ICD-10-CM | POA: Insufficient documentation

## 2017-06-26 DIAGNOSIS — N186 End stage renal disease: Secondary | ICD-10-CM | POA: Insufficient documentation

## 2017-06-26 DIAGNOSIS — I132 Hypertensive heart and chronic kidney disease with heart failure and with stage 5 chronic kidney disease, or end stage renal disease: Secondary | ICD-10-CM | POA: Insufficient documentation

## 2017-06-26 DIAGNOSIS — Z79899 Other long term (current) drug therapy: Secondary | ICD-10-CM | POA: Insufficient documentation

## 2017-06-26 DIAGNOSIS — I2510 Atherosclerotic heart disease of native coronary artery without angina pectoris: Secondary | ICD-10-CM | POA: Insufficient documentation

## 2017-06-26 DIAGNOSIS — Z951 Presence of aortocoronary bypass graft: Secondary | ICD-10-CM | POA: Diagnosis not present

## 2017-06-26 LAB — CBC
HCT: 22.7 % — ABNORMAL LOW (ref 40.0–52.0)
Hemoglobin: 7.1 g/dL — ABNORMAL LOW (ref 13.0–18.0)
MCH: 27.8 pg (ref 26.0–34.0)
MCHC: 31.1 g/dL — ABNORMAL LOW (ref 32.0–36.0)
MCV: 89.4 fL (ref 80.0–100.0)
PLATELETS: 351 10*3/uL (ref 150–440)
RBC: 2.54 MIL/uL — AB (ref 4.40–5.90)
RDW: 18.1 % — AB (ref 11.5–14.5)
WBC: 5.7 10*3/uL (ref 3.8–10.6)

## 2017-06-26 LAB — RENAL FUNCTION PANEL
Albumin: 2.2 g/dL — ABNORMAL LOW (ref 3.5–5.0)
Anion gap: 9 (ref 5–15)
BUN: 56 mg/dL — AB (ref 6–20)
CALCIUM: 8 mg/dL — AB (ref 8.9–10.3)
CO2: 25 mmol/L (ref 22–32)
CREATININE: 10.19 mg/dL — AB (ref 0.61–1.24)
Chloride: 105 mmol/L (ref 101–111)
GFR, EST AFRICAN AMERICAN: 6 mL/min — AB (ref >60)
GFR, EST NON AFRICAN AMERICAN: 5 mL/min — AB (ref >60)
Glucose, Bld: 165 mg/dL — ABNORMAL HIGH (ref 65–99)
Phosphorus: 4.8 mg/dL — ABNORMAL HIGH (ref 2.5–4.6)
Potassium: 6.2 mmol/L — ABNORMAL HIGH (ref 3.5–5.1)
Sodium: 139 mmol/L (ref 135–145)

## 2017-06-26 MED ORDER — SODIUM CHLORIDE 0.9 % IV SOLN: 100.0000 mL | INTRAVENOUS | Status: DC | PRN

## 2017-06-26 MED ORDER — HEPARIN SODIUM (PORCINE) 1000 UNIT/ML DIALYSIS: 1000.0000 [IU] | INTRAMUSCULAR | Status: DC | PRN

## 2017-06-26 MED ORDER — ALTEPLASE 2 MG IJ SOLR: 2.0000 mg | Freq: Once | INTRAMUSCULAR | Status: DC | PRN

## 2017-06-26 NOTE — ED Triage Notes (Signed)
Arrives for scheduled dialysis.  Denies all complaints.

## 2017-06-26 NOTE — Progress Notes (Signed)
Post HD assessment    06/26/17 1638  Neurological  Level of Consciousness Alert  Orientation Level Oriented X4  Respiratory  Respiratory Pattern Regular;Unlabored  Chest Assessment Chest expansion symmetrical  Cough Non-productive  Cardiac  ECG Monitor Yes  Vascular  R Radial Pulse +2  L Radial Pulse +2  Integumentary  Integumentary (WDL) X  Skin Color Appropriate for ethnicity  Musculoskeletal  Musculoskeletal (WDL) X  Generalized Weakness Yes  Assistive Device None  GU Assessment  Genitourinary (WDL) X  Genitourinary Symptoms  (HD)  Psychosocial  Psychosocial (WDL) WDL

## 2017-06-26 NOTE — Progress Notes (Signed)
Pre HD Tx    06/26/17 1421  Vital Signs  Temp 97.6 F (36.4 C)  Temp Source Oral  Pulse Rate 80  Pulse Rate Source Monitor  Resp 16  BP Location Left Arm  BP Method Automatic  Patient Position (if appropriate) Sitting  Oxygen Therapy  SpO2 100 %  O2 Device Room Air  Pain Assessment  Pain Scale 0-10  Pain Score 0  Dialysis Weight  Weight 72.2 kg (159 lb 2.8 oz)  Type of Weight Pre-Dialysis  Education / Care Plan  Dialysis Education Provided Yes  Documented Education in Care Plan Yes  Hemodialysis Catheter Right Internal jugular Double-lumen  No Placement Date or Time found.   Placed prior to admission: Yes  Orientation: Right  Access Location: Internal jugular  Hemodialysis Catheter Type: Double-lumen  Site Condition No complications  Blue Lumen Status Flushed  Red Lumen Status Flushed  Purple Lumen Status N/A  Dressing Type Biopatch;Gauze/Drain sponge;Occlusive  Dressing Status Clean;Dry;Intact  Dressing Change Due 06/28/17

## 2017-06-26 NOTE — ED Notes (Signed)
Pt transported to dialysis at this time.

## 2017-06-26 NOTE — ED Notes (Signed)
Pt states he can't wait for paperwork because his ride is here, pt left without signed discharge forms at this time . PT in NAD

## 2017-06-26 NOTE — Progress Notes (Signed)
Pre HD Assessment    06/26/17 1415  Neurological  Level of Consciousness Alert  Orientation Level Oriented X4  Respiratory  Respiratory Pattern Regular;Unlabored  Chest Assessment Chest expansion symmetrical  Bilateral Breath Sounds Diminished  Cough Non-productive  Cardiac  Pulse Regular  Heart Sounds S1, S2  ECG Monitor Yes  Vascular  R Radial Pulse +2  L Radial Pulse +2  Edema Generalized (abdominal)  Generalized Edema +2  Psychosocial  Psychosocial (WDL) WDL  Patient Behaviors Appropriate for situation;Cooperative;Agitated;Irritable;Aggressive verbally

## 2017-06-26 NOTE — Progress Notes (Signed)
HD Tx started    06/26/17 1427  During Hemodialysis Assessment  Blood Flow Rate (mL/min) 400 mL/min  Arterial Pressure (mmHg) -140 mmHg  Venous Pressure (mmHg) 130 mmHg  Transmembrane Pressure (mmHg) 80 mmHg  Ultrafiltration Rate (mL/min) 1000 mL/min  Dialysate Flow Rate (mL/min) 800 ml/min  Conductivity: Machine  14.3  HD Safety Checks Performed Yes  Dialysis Fluid Bolus Normal Saline  Bolus Amount (mL) 250 mL  Intra-Hemodialysis Comments Tx initiated

## 2017-06-26 NOTE — ED Provider Notes (Signed)
[facility] Emergency Department Provider Note  ___________________________________________   First MD Initiated Contact with Patient 06/26/17 1245     (approximate)  I have reviewed the triage vital signs and the nursing notes.   HISTORY  Chief Complaint scheduled dialysis   HPI Stanley [name] is a 57 y.o. male with a history of end-stage renal disease on dialysis who is presenting to the emergency department today for scheduled dialysis.  He has no complaints.  No shortness of breath, chest pain or abdominal pain.  Past Medical History:  Diagnosis Date  . Alcohol abuse   . CHF (congestive heart failure) ([place])   . Cirrhosis ([place])   . Coronary artery disease 2009  . Drug abuse ([place])   . End stage renal disease on dialysis [facility] NEPHROLOGIST-   DR [facility]  IN [place]   HEMODIALYSIS --   TUES/  THURS/  SAT  . Gastrointestinal bleed 06/13/2017   From chart...hx of multiple GI bleeds  . GERD (gastroesophageal reflux disease)   . Hyperlipidemia   . Hypertension   . PAD (peripheral artery disease) ([place])   . Renal insufficiency    Per pt, 32 oz fluid restriction per day  . S/P triple vessel bypass 06/09/2016   2009ish  . Suicidal ideation    & HOMICIDAL IDEATION --  06-16-2013   ADMITTED TO BEHAVIOR HEALTH    Patient Active Problem List   Diagnosis Date Noted  . Protein-calorie malnutrition, severe 06/14/2017  . Encounter for dialysis [facility])   . Palliative care by specialist   . Goals of care, counseling/discussion   . Malnutrition of moderate degree 06/05/2017  . Secondary esophageal varices without bleeding ([place])   . Stomach irritation   . Idiopathic esophageal varices without bleeding ([place])   . Alcoholic hepatitis with ascites 05/24/2017  . ESRD (end stage renal disease) ([place]) 04/28/2017  . Uremia 03/08/2017  . ESRD on hemodialysis ([place]) 03/03/2017  . Weakness 02/28/2017  . Hypocalcemia 02/22/2017  . Shortness of breath 11/26/2016    . COPD (chronic obstructive pulmonary disease) ([place]) 10/30/2016  . COPD exacerbation ([place]) 10/29/2016  . Anemia   . Heme positive stool   . Ulceration of intestine   . Benign neoplasm of transverse colon   . Acute gastrointestinal hemorrhage   . Esophageal candidiasis ([place])   . Angiodysplasia of intestinal tract   . Acute respiratory failure with hypoxia ([place]) 07/03/2016  . GI bleeding 06/24/2016  . Rectal bleeding 06/14/2016  . Anemia of chronic disease 06/01/2016  . MRSA carrier 06/01/2016  . Chronic renal failure 05/23/2016  . Ischemic heart disease 05/23/2016  . Angiodysplasia of small intestine   . Melena   . Small bowel bleed not requiring more than 4 units of blood in 24 hours, ICU, or surgery   . Anemia due to chronic blood loss   . Abdominal pain 05/05/2016  . Acute posthemorrhagic anemia 04/17/2016  . Gastrointestinal bleed 04/17/2016  . History of esophagogastroduodenoscopy (EGD) 04/17/2016  . Elevated troponin 04/17/2016  . Alcohol abuse 04/17/2016  . Upper GI bleed 01/19/2016  . Blood in stool   . Angiodysplasia of stomach and duodenum with hemorrhage   . Gastritis   . Reflux esophagitis   . GI bleed 05/16/2015  . Acute GI bleeding   . Symptomatic anemia 04/30/2015  . HTN (hypertension) 04/06/2015  . GERD (gastroesophageal reflux disease) 04/06/2015  . HLD (hyperlipidemia) 04/06/2015  . Dyspnea 04/06/2015  . Cirrhosis of liver with ascites ([place]) 04/06/2015  .  Ascites 04/06/2015  . GIB (gastrointestinal bleeding) 03/23/2015  . Homicidal ideation 06/19/2013  . Suicidal intent 06/19/2013  . Homicidal ideations 06/19/2013  . Hyperkalemia 06/16/2013  . Mandible fracture ([place]) 06/05/2013  . Fracture, mandible ([place]) 06/02/2013  . Coronary atherosclerosis of native coronary artery 06/02/2013  . ESRD on dialysis ([place]) 06/02/2013  . Mandible open fracture ([place]) 06/02/2013    Past Surgical History:  Procedure Laterality Date  . A/V FISTULAGRAM Right 06/06/2017    Procedure: A/V FISTULAGRAM;  Surgeon: Katha Cabal, MD;  Location: [place] CV LAB;  Service: Cardiovascular;  Laterality: Right;  . A/V SHUNT INTERVENTION N/A 06/06/2017   Procedure: A/V SHUNT INTERVENTION;  Surgeon: Katha Cabal, MD;  Location: [place] CV LAB;  Service: Cardiovascular;  Laterality: N/A;  . AGILE CAPSULE N/A 06/19/2016   Procedure: AGILE CAPSULE;  Surgeon: [name], MD;  Location: [facility] ENDOSCOPY;  Service: Endoscopy;  Laterality: N/A;  . COLONOSCOPY WITH PROPOFOL N/A 06/18/2016   Procedure: COLONOSCOPY WITH PROPOFOL;  Surgeon: [name], MD;  Location: [facility] ENDOSCOPY;  Service: Endoscopy;  Laterality: N/A;  . COLONOSCOPY WITH PROPOFOL N/A 08/12/2016   Procedure: COLONOSCOPY WITH PROPOFOL;  Surgeon: [name], MD;  Location[facility] ENDOSCOPY;  Service: Endoscopy;  Laterality: N/A;  . COLONOSCOPY WITH PROPOFOL N/A 05/05/2017   Procedure: COLONOSCOPY WITH PROPOFOL;  Surgeon: Manya Silvas, MD;  Location: [facility] ENDOSCOPY;  Service: Endoscopy;  Laterality: N/A;  . CORONARY ANGIOPLASTY  ?   PT UNABLE TO TELL IF  BEFORE OR AFTER  CABG  . CORONARY ARTERY BYPASS GRAFT  2008  ([place])   3 VESSEL  . DIALYSIS FISTULA CREATION  LAST SURGERY  APPOX  2008  . ENTEROSCOPY N/A 05/10/2016   Procedure: ENTEROSCOPY;  Surgeon: Jerene Bears, MD;  Location: [place];  Service: Gastroenterology;  Laterality: N/A;  . ENTEROSCOPY N/A 08/12/2016   Procedure: ENTEROSCOPY;  Surgeon: [name], MD;  Location: [facility] ENDOSCOPY;  Service: Endoscopy;  Laterality: N/A;  . ENTEROSCOPY Left 06/03/2017   Procedure: ENTEROSCOPY;  Surgeon: Virgel Manifold, MD;  Location: [facility] ENDOSCOPY;  Service: Endoscopy;  Laterality: Left;  Procedure date will ultimately depend on when patient is medically optimized before the procedure, pending hemodialysis and blood transfusions etc. Will place on schedule and change depending on clinical status.   . ENTEROSCOPY N/A 06/05/2017   Procedure:  ENTEROSCOPY;  Surgeon: Virgel Manifold, MD;  Location: [facility] ENDOSCOPY;  Service: Endoscopy;  Laterality: N/A;  . ENTEROSCOPY N/A 06/15/2017   Procedure: Push ENTEROSCOPY;  Surgeon: [name], MD;  Location: [facility] ENDOSCOPY;  Service: Endoscopy;  Laterality: N/A;  . ESOPHAGOGASTRODUODENOSCOPY N/A 05/07/2015   Procedure: ESOPHAGOGASTRODUODENOSCOPY (EGD);  Surgeon: Hulen Luster, MD;  Location: [facility] ENDOSCOPY;  Service: Endoscopy;  Laterality: N/A;  . ESOPHAGOGASTRODUODENOSCOPY (EGD) WITH PROPOFOL N/A 05/17/2015   Procedure: ESOPHAGOGASTRODUODENOSCOPY (EGD) WITH PROPOFOL;  Surgeon: [name], MD;  Location: [facility] ENDOSCOPY;  Service: Endoscopy;  Laterality: N/A;  . ESOPHAGOGASTRODUODENOSCOPY (EGD) WITH PROPOFOL N/A 01/20/2016   Procedure: ESOPHAGOGASTRODUODENOSCOPY (EGD) WITH PROPOFOL;  Surgeon: [name], MD;  Location: [facility] ENDOSCOPY;  Service: Endoscopy;  Laterality: N/A;  . ESOPHAGOGASTRODUODENOSCOPY (EGD) WITH PROPOFOL N/A 04/17/2016   Procedure: ESOPHAGOGASTRODUODENOSCOPY (EGD) WITH PROPOFOL;  Surgeon: Lin Landsman, MD;  Location: [facility] ENDOSCOPY;  Service: Gastroenterology;  Laterality: N/A;  . ESOPHAGOGASTRODUODENOSCOPY (EGD) WITH PROPOFOL  05/09/2016   Procedure: ESOPHAGOGASTRODUODENOSCOPY (EGD) WITH PROPOFOL;  Surgeon: Jerene Bears, MD;  Location: [place] ENDOSCOPY;  Service: Endoscopy;;  . ESOPHAGOGASTRODUODENOSCOPY (EGD) WITH PROPOFOL N/A  06/16/2016   Procedure: ESOPHAGOGASTRODUODENOSCOPY (EGD) WITH PROPOFOL;  Surgeon: [name], MD;  Location: [facility] ENDOSCOPY;  Service: Endoscopy;  Laterality: N/A;  . ESOPHAGOGASTRODUODENOSCOPY (EGD) WITH PROPOFOL N/A 05/05/2017   Procedure: ESOPHAGOGASTRODUODENOSCOPY (EGD) WITH PROPOFOL;  Surgeon: Manya Silvas, MD;  Location: [facility] ENDOSCOPY;  Service: Endoscopy;  Laterality: N/A;  . ESOPHAGOGASTRODUODENOSCOPY (EGD) WITH PROPOFOL N/A 06/15/2017   Procedure: ESOPHAGOGASTRODUODENOSCOPY (EGD) WITH PROPOFOL;  Surgeon: [name], MD;  Location: [facility] ENDOSCOPY;   Service: Endoscopy;  Laterality: N/A;  . GIVENS CAPSULE STUDY N/A 05/07/2016   Procedure: GIVENS CAPSULE STUDY;  Surgeon: Doran Stabler, MD;  Location: [place];  Service: Endoscopy;  Laterality: N/A;  . MANDIBULAR HARDWARE REMOVAL N/A 07/29/2013   Procedure: REMOVAL OF ARCH BARS;  Surgeon: Theodoro Kos, DO;  Location: [place];  Service: Plastics;  Laterality: N/A;  . ORIF MANDIBULAR FRACTURE N/A 06/05/2013   Procedure: REPAIR OF MANDIBULAR FRACTURE x 2 with maxillo-mandibular fixation ;  Surgeon: Theodoro Kos, DO;  Location: [place];  Service: Plastics;  Laterality: N/A;  . PERIPHERAL ARTERIAL STENT GRAFT Left     Prior to Admission medications   Medication Sig Start Date End Date Taking? Authorizing Provider  albuterol (PROVENTIL HFA;VENTOLIN HFA) 108 (90 Base) MCG/ACT inhaler Inhale 2 puffs into the lungs every 6 (six) hours as needed for wheezing or shortness of breath. 04/21/17   Lisa Roca, MD  alum & mag hydroxide-simeth (MAALOX/MYLANTA) 200-200-20 MG/5ML suspension Take 15 mLs every 4 (four) hours as needed by mouth for indigestion or heartburn. Patient not taking: Reported on 04/19/2017 01/25/17   Demetrios Loll, MD  atorvastatin (LIPITOR) 40 MG tablet Take 1 tablet (40 mg total) by mouth daily. 11/16/16   Vaughan Basta, MD  budesonide-formoterol (SYMBICORT) 160-4.5 MCG/ACT inhaler Inhale 2 puffs into the lungs daily. 11/16/16   Vaughan Basta, MD  calcium acetate (PHOSLO) 667 MG capsule Take 2,001 mg by mouth 3 (three) times daily.    [provider]  ferrous sulfate 325 (65 FE) MG tablet Take 1 tablet by mouth 3 (three) times daily. 01/21/17   [provider]  furosemide (LASIX) 80 MG tablet Take 1 tablet (80 mg total) daily by mouth. 01/25/17   Demetrios Loll, MD  gabapentin (NEURONTIN) 300 MG capsule Take 1 capsule by mouth daily as directed 08/16/16   [provider]  ipratropium-albuterol (DUONEB) 0.5-2.5 (3) MG/3ML SOLN Take 3  mLs by nebulization every 6 (six) hours as needed. 11/28/16   Loletha Grayer, MD  labetalol (NORMODYNE) 100 MG tablet Take 100 mg by mouth daily. 03/24/17   [provider]  Multiple Vitamins-Minerals-FA (DIALYVITE SUPREME D) 3 MG TABS Take 1 tablet (3 mg total) by mouth daily. 11/28/16   Loletha Grayer, MD  nitroGLYCERIN (NITROSTAT) 0.4 MG SL tablet Place 1 tablet (0.4 mg total) under the tongue every 5 (five) minutes as needed. 04/12/16   Wende Bushy, MD  pantoprazole (PROTONIX) 40 MG tablet Take 1 tablet (40 mg total) by mouth daily. 11/28/16   Loletha Grayer, MD  Spacer/Aero Chamber Mouthpiece MISC 1 Units by Does not apply route every 4 (four) hours as needed (wheezing). 02/19/17   Darel Hong, MD  tiotropium (SPIRIVA HANDIHALER) 18 MCG inhalation capsule Place 1 capsule (18 mcg total) into inhaler and inhale daily. 11/16/16 11/16/17  Vaughan Basta, MD    Allergies Patient has no known allergies.  Family History  Problem Relation Age of Onset  . Colon cancer Mother   . Cancer Father   .  Cancer Sister   . Kidney disease Brother     Social History Social History   Tobacco Use  . Smoking status: Current Every Day Smoker    Packs/day: 0.15    Years: 40.00    Pack years: 6.00    Types: Cigarettes  . Smokeless tobacco: Never Used  Substance Use Topics  . Alcohol use: No    Comment: pt reports quitting after learning about cirrhosis  . Drug use: No    Frequency: 7.0 times per week    Types: Marijuana, Cocaine    Review of Systems  Constitutional: No fever/chills Eyes: No visual changes. ENT: No sore throat. Cardiovascular: Denies chest pain. Respiratory: Denies shortness of breath. Gastrointestinal: No abdominal pain.  No nausea, no vomiting.  No diarrhea.  No constipation. Genitourinary: Negative for dysuria. Musculoskeletal: Negative for back pain. Skin: Negative for rash. Neurological: Negative for headaches, focal weakness or  numbness.   ____________________________________________   PHYSICAL EXAM:  VITAL SIGNS: ED Triage Vitals  Enc Vitals Group     BP 06/26/17 1157 109/65     Pulse Rate 06/26/17 1156 81     Resp 06/26/17 1156 16     Temp 06/26/17 1156 97.8 F (36.6 C)     Temp Source 06/26/17 1156 Oral     SpO2 06/26/17 1156 100 %     Weight 06/26/17 1157 159 lb (72.1 kg)     Height 06/26/17 1157 6\' 3"  (1.905 m)     Head Circumference --      Peak Flow --      Pain Score 06/26/17 1157 0     Pain Loc --      Pain Edu? --      Excl. in [place]? --     Constitutional: Alert and oriented. Well appearing and in no acute distress. Eyes: Conjunctivae are normal.  Head: Atraumatic. Nose: No congestion/rhinnorhea. Mouth/Throat: Mucous membranes are moist.  Neck: No stridor.   Cardiovascular: Normal rate, regular rhythm. Grossly normal heart sounds.  Right-sided chest permacath with dressing CDI. Respiratory: Normal respiratory effort.  No retractions. Lungs CTAB. Gastrointestinal: Soft and nontender.  Moderate abdominal distention without tenderness.  Abdomen is not tense.  Consistent with chronic ascites Musculoskeletal: No lower extremity tenderness nor edema.  No joint effusions. Neurologic:  Normal speech and language. No gross focal neurologic deficits are appreciated. Skin:  Skin is warm, dry and intact. No rash noted. Psychiatric: Mood and affect are normal. Speech and behavior are normal.  ____________________________________________   LABS (all labs ordered are listed, but only abnormal results are displayed)  Labs Reviewed - No data to display ____________________________________________  EKG   ____________________________________________  RADIOLOGY   ____________________________________________   PROCEDURES  Procedure(s) performed:   Procedures  Critical Care performed:   ____________________________________________   INITIAL IMPRESSION / ASSESSMENT AND PLAN / ED  COURSE  Pertinent labs & imaging results that were available during my care of the patient were reviewed by me and considered in my medical decision making (see chart for details).  DDX: Routine dialysis visit, chronic ascites As part of my medical decision making, I reviewed the following data within the [place] Notes from prior ED visits  ____________________________________________   FINAL CLINICAL IMPRESSION(S) / ED DIAGNOSES  Visit for hemodialysis    NEW MEDICATIONS STARTED DURING THIS VISIT:  New Prescriptions   No medications on file     Note:  This document was prepared using Dragon voice recognition software and may include  unintentional dictation errors.     Orbie Pyo, MD 06/26/17 1248

## 2017-06-26 NOTE — Progress Notes (Signed)
HD tx end   06/26/17 1631  Vital Signs  Pulse Rate 89  Pulse Rate Source Monitor  Resp 18  BP 130/78  BP Location Left Arm  BP Method Automatic  Patient Position (if appropriate) Sitting  Oxygen Therapy  SpO2 99 %  O2 Device Room Air  During Hemodialysis Assessment  Dialysis Fluid Bolus Normal Saline  Bolus Amount (mL) 250 mL  Intra-Hemodialysis Comments Tx completed

## 2017-06-26 NOTE — Progress Notes (Signed)
Post HD assessment. Pt tolerated tx well without c/o or complications. Net UF 1534, goal not met, pt requested to be taken off early. MD aware.    06/26/17 1637  Vital Signs  Temp 98.2 F (36.8 C)  Temp Source Oral  Pulse Rate 89  Pulse Rate Source Monitor  Resp 20  BP 112/72  BP Location Left Arm  BP Method Automatic  Patient Position (if appropriate) Sitting  Oxygen Therapy  SpO2 96 %  O2 Device Room Air  Dialysis Weight  Weight  (uable to weight pt, no scale available)  Type of Weight Post-Dialysis  Post-Hemodialysis Assessment  Rinseback Volume (mL) 250 mL  KECN 45.4 V  Dialyzer Clearance Lightly streaked  Duration of HD Treatment -hour(s) 2 hour(s)  Hemodialysis Intake (mL) 500 mL  UF Total -Machine (mL) 2034 mL  Net UF (mL) 1534 mL  Tolerated HD Treatment Yes  Education / Care Plan  Dialysis Education Provided Yes  Documented Education in Care Plan Yes  Hemodialysis Catheter Right Internal jugular Double-lumen  No Placement Date or Time found.   Placed prior to admission: Yes  Orientation: Right  Access Location: Internal jugular  Hemodialysis Catheter Type: Double-lumen  Site Condition No complications  Blue Lumen Status Heparin locked  Red Lumen Status Heparin locked  Purple Lumen Status N/A  Catheter fill solution Heparin 1000 units/ml  Catheter fill volume (Arterial) 1.5 cc  Catheter fill volume (Venous) 1.5  Dressing Type Biopatch  Dressing Status Clean;Dry;Intact  Drainage Description None  Post treatment catheter status Capped and Clamped

## 2017-06-26 NOTE — ED Provider Notes (Signed)
-----------------------------------------   5:19 PM on 06/26/2017 -----------------------------------------  Patient returned from dialysis.  He is A&Ox3, ambulating without difficulty, and comfortable appearing.  He feels ready for discharge home.  Arta Silence, MD 06/26/17 1719

## 2017-06-28 ENCOUNTER — Other Ambulatory Visit: Payer: Self-pay

## 2017-06-28 ENCOUNTER — Ambulatory Visit
Admission: EM | Admit: 2017-06-28 | Discharge: 2017-06-28 | Disposition: A | Discharge status: Home / Self Care | Payer: Medicare Other | Attending: Emergency Medicine | Admitting: Emergency Medicine

## 2017-06-28 ENCOUNTER — Encounter: Payer: Self-pay | Admitting: Emergency Medicine

## 2017-06-28 DIAGNOSIS — N186 End stage renal disease: Secondary | ICD-10-CM | POA: Insufficient documentation

## 2017-06-28 DIAGNOSIS — E43 Unspecified severe protein-calorie malnutrition: Secondary | ICD-10-CM | POA: Diagnosis not present

## 2017-06-28 DIAGNOSIS — Z9889 Other specified postprocedural states: Secondary | ICD-10-CM | POA: Insufficient documentation

## 2017-06-28 DIAGNOSIS — I132 Hypertensive heart and chronic kidney disease with heart failure and with stage 5 chronic kidney disease, or end stage renal disease: Secondary | ICD-10-CM | POA: Diagnosis not present

## 2017-06-28 DIAGNOSIS — F17210 Nicotine dependence, cigarettes, uncomplicated: Secondary | ICD-10-CM | POA: Insufficient documentation

## 2017-06-28 DIAGNOSIS — R188 Other ascites: Secondary | ICD-10-CM | POA: Insufficient documentation

## 2017-06-28 DIAGNOSIS — E785 Hyperlipidemia, unspecified: Secondary | ICD-10-CM | POA: Insufficient documentation

## 2017-06-28 DIAGNOSIS — J449 Chronic obstructive pulmonary disease, unspecified: Secondary | ICD-10-CM | POA: Insufficient documentation

## 2017-06-28 DIAGNOSIS — I509 Heart failure, unspecified: Secondary | ICD-10-CM | POA: Diagnosis not present

## 2017-06-28 DIAGNOSIS — D123 Benign neoplasm of transverse colon: Secondary | ICD-10-CM | POA: Diagnosis not present

## 2017-06-28 DIAGNOSIS — K921 Melena: Secondary | ICD-10-CM | POA: Insufficient documentation

## 2017-06-28 DIAGNOSIS — I120 Hypertensive chronic kidney disease with stage 5 chronic kidney disease or end stage renal disease: Secondary | ICD-10-CM | POA: Diagnosis not present

## 2017-06-28 DIAGNOSIS — I8510 Secondary esophageal varices without bleeding: Secondary | ICD-10-CM | POA: Insufficient documentation

## 2017-06-28 DIAGNOSIS — Z992 Dependence on renal dialysis: Secondary | ICD-10-CM | POA: Insufficient documentation

## 2017-06-28 DIAGNOSIS — I2510 Atherosclerotic heart disease of native coronary artery without angina pectoris: Secondary | ICD-10-CM | POA: Diagnosis not present

## 2017-06-28 DIAGNOSIS — D638 Anemia in other chronic diseases classified elsewhere: Secondary | ICD-10-CM | POA: Diagnosis not present

## 2017-06-28 DIAGNOSIS — K7460 Unspecified cirrhosis of liver: Secondary | ICD-10-CM | POA: Insufficient documentation

## 2017-06-28 LAB — PHOSPHORUS: Phosphorus: 4.8 mg/dL — ABNORMAL HIGH (ref 2.5–4.6)

## 2017-06-28 MED ORDER — DIPHENHYDRAMINE HCL 50 MG/ML IJ SOLN
25.0000 mg | Freq: Once | INTRAMUSCULAR | Status: AC
  Administered 2017-06-28: 25 mg via INTRAVENOUS

## 2017-06-28 NOTE — ED Notes (Signed)
Dr. Jimmye Norman at bedside. Pt denies any pain or problems. Alert, oriented. Waiting on dialysis.

## 2017-06-28 NOTE — ED Notes (Signed)
Called dialysis nurse Melissa who stated that phosphorus draw would be in dialysis. Stated that in about 20 minutes she would call this RN back to get pt down to dialysis for routine treatment.

## 2017-06-28 NOTE — ED Triage Notes (Signed)
Pt in via POV for routine dialysis, no additional complaints at this time.

## 2017-06-28 NOTE — ED Provider Notes (Signed)
[facility] Emergency Department Provider Note       Time seen: ----------------------------------------- 12:51 PM on 06/28/2017 -----------------------------------------   I have reviewed the triage vital signs and the nursing notes.  HISTORY   Chief Complaint Dialysis    HPI Stanley [name] is a 57 y.o. male with a history of alcohol abuse, CHF, cirrhosis, drug abuse, end-stage renal disease on dialysis who presents to the ED for routine dialysis.  He has no other complaints.  Past Medical History:  Diagnosis Date  . Alcohol abuse   . CHF (congestive heart failure) ([place])   . Cirrhosis ([place])   . Coronary artery disease 2009  . Drug abuse ([place])   . End stage renal disease on dialysis [facility]) NEPHROLOGIST-   DR [facility]  IN [place]   HEMODIALYSIS --   TUES/  THURS/  SAT  . Gastrointestinal bleed 06/13/2017   From chart...hx of multiple GI bleeds  . GERD (gastroesophageal reflux disease)   . Hyperlipidemia   . Hypertension   . PAD (peripheral artery disease) ([place])   . Renal insufficiency    Per pt, 32 oz fluid restriction per day  . S/P triple vessel bypass 06/09/2016   2009ish  . Suicidal ideation    & HOMICIDAL IDEATION --  06-16-2013   ADMITTED TO BEHAVIOR HEALTH    Patient Active Problem List   Diagnosis Date Noted  . Protein-calorie malnutrition, severe 06/14/2017  . Encounter for dialysis [facility])   . Palliative care by specialist   . Goals of care, counseling/discussion   . Malnutrition of moderate degree 06/05/2017  . Secondary esophageal varices without bleeding ([place])   . Stomach irritation   . Idiopathic esophageal varices without bleeding ([place])   . Alcoholic hepatitis with ascites 05/24/2017  . ESRD (end stage renal disease) ([place]) 04/28/2017  . Uremia 03/08/2017  . ESRD on hemodialysis ([place]) 03/03/2017  . Weakness 02/28/2017  . Hypocalcemia 02/22/2017  . Shortness of breath 11/26/2016  . COPD (chronic obstructive pulmonary  disease) ([place]) 10/30/2016  . COPD exacerbation ([place]) 10/29/2016  . Anemia   . Heme positive stool   . Ulceration of intestine   . Benign neoplasm of transverse colon   . Acute gastrointestinal hemorrhage   . Esophageal candidiasis ([place])   . Angiodysplasia of intestinal tract   . Acute respiratory failure with hypoxia ([place]) 07/03/2016  . GI bleeding 06/24/2016  . Rectal bleeding 06/14/2016  . Anemia of chronic disease 06/01/2016  . MRSA carrier 06/01/2016  . Chronic renal failure 05/23/2016  . Ischemic heart disease 05/23/2016  . Angiodysplasia of small intestine   . Melena   . Small bowel bleed not requiring more than 4 units of blood in 24 hours, ICU, or surgery   . Anemia due to chronic blood loss   . Abdominal pain 05/05/2016  . Acute posthemorrhagic anemia 04/17/2016  . Gastrointestinal bleed 04/17/2016  . History of esophagogastroduodenoscopy (EGD) 04/17/2016  . Elevated troponin 04/17/2016  . Alcohol abuse 04/17/2016  . Upper GI bleed 01/19/2016  . Blood in stool   . Angiodysplasia of stomach and duodenum with hemorrhage   . Gastritis   . Reflux esophagitis   . GI bleed 05/16/2015  . Acute GI bleeding   . Symptomatic anemia 04/30/2015  . HTN (hypertension) 04/06/2015  . GERD (gastroesophageal reflux disease) 04/06/2015  . HLD (hyperlipidemia) 04/06/2015  . Dyspnea 04/06/2015  . Cirrhosis of liver with ascites ([place]) 04/06/2015  . Ascites 04/06/2015  . GIB (gastrointestinal bleeding) 03/23/2015  .  Homicidal ideation 06/19/2013  . Suicidal intent 06/19/2013  . Homicidal ideations 06/19/2013  . Hyperkalemia 06/16/2013  . Mandible fracture ([place]) 06/05/2013  . Fracture, mandible ([place]) 06/02/2013  . Coronary atherosclerosis of native coronary artery 06/02/2013  . ESRD on dialysis ([place]) 06/02/2013  . Mandible open fracture ([place]) 06/02/2013    Past Surgical History:  Procedure Laterality Date  . A/V FISTULAGRAM Right 06/06/2017   Procedure: A/V FISTULAGRAM;   Surgeon: Katha Cabal, MD;  Location: [place] [facility];  Service: Cardiovascular;  Laterality: Right;  . A/V SHUNT INTERVENTION N/A 06/06/2017   Procedure: A/V SHUNT INTERVENTION;  Surgeon: Katha Cabal, MD;  Location: [place] [facility];  Service: Cardiovascular;  Laterality: N/A;  . AGILE CAPSULE N/A 06/19/2016   Procedure: AGILE CAPSULE;  Surgeon: [name], MD;  Location: [facility] ENDOSCOPY;  Service: Endoscopy;  Laterality: N/A;  . COLONOSCOPY WITH PROPOFOL N/A 06/18/2016   Procedure: COLONOSCOPY WITH PROPOFOL;  Surgeon: [name], MD;  Location: [facility] ENDOSCOPY;  Service: Endoscopy;  Laterality: N/A;  . COLONOSCOPY WITH PROPOFOL N/A 08/12/2016   Procedure: COLONOSCOPY WITH PROPOFOL;  Surgeon: [name], MD;  Location: [facility] ENDOSCOPY;  Service: Endoscopy;  Laterality: N/A;  . COLONOSCOPY WITH PROPOFOL N/A 05/05/2017   Procedure: COLONOSCOPY WITH PROPOFOL;  Surgeon: Manya Silvas, MD;  Location: [facility] ENDOSCOPY;  Service: Endoscopy;  Laterality: N/A;  . CORONARY ANGIOPLASTY  ?   PT UNABLE TO TELL IF  BEFORE OR AFTER  CABG  . CORONARY ARTERY BYPASS GRAFT  2008  ([place] , )   3 VESSEL  . DIALYSIS FISTULA CREATION  LAST SURGERY  APPOX  2008  . ENTEROSCOPY N/A 05/10/2016   Procedure: ENTEROSCOPY;  Surgeon: Jerene Bears, MD;  Location: [place];  Service: Gastroenterology;  Laterality: N/A;  . ENTEROSCOPY N/A 08/12/2016   Procedure: ENTEROSCOPY;  Surgeon: [name], MD;  Location: [facility] ENDOSCOPY;  Service: Endoscopy;  Laterality: N/A;  . ENTEROSCOPY Left 06/03/2017   Procedure: ENTEROSCOPY;  Surgeon: Virgel Manifold, MD;  Location: [facility] ENDOSCOPY;  Service: Endoscopy;  Laterality: Left;  Procedure date will ultimately depend on when patient is medically optimized before the procedure, pending hemodialysis and blood transfusions etc. Will place on schedule and change depending on clinical status.   . ENTEROSCOPY N/A 06/05/2017   Procedure: ENTEROSCOPY;  Surgeon:  Virgel Manifold, MD;  Location: [facility] ENDOSCOPY;  Service: Endoscopy;  Laterality: N/A;  . ENTEROSCOPY N/A 06/15/2017   Procedure: Push ENTEROSCOPY;  Surgeon: [name], MD;  Location[facility]COPY;  Service: Endoscopy;  Laterality: N/A;  . ESOPHAGOGASTRODUODENOSCOPY N/A 05/07/2015   Procedure: ESOPHAGOGASTRODUODENOSCOPY (EGD);  Surgeon: Hulen Luster, MD;  Location: [facility] ENDOSCOPY;  Service: Endoscopy;  Laterality: N/A;  . ESOPHAGOGASTRODUODENOSCOPY (EGD) WITH PROPOFOL N/A 05/17/2015   Procedure: ESOPHAGOGASTRODUODENOSCOPY (EGD) WITH PROPOFOL;  Surgeon: [name], MD;  Location: [facility] ENDOSCOPY;  Service: Endoscopy;  Laterality: N/A;  . ESOPHAGOGASTRODUODENOSCOPY (EGD) WITH PROPOFOL N/A 01/20/2016   Procedure: ESOPHAGOGASTRODUODENOSCOPY (EGD) WITH PROPOFOL;  Surgeon: [name], MD;  Location: [facility] ENDOSCOPY;  Service: Endoscopy;  Laterality: N/A;  . ESOPHAGOGASTRODUODENOSCOPY (EGD) WITH PROPOFOL N/A 04/17/2016   Procedure: ESOPHAGOGASTRODUODENOSCOPY (EGD) WITH PROPOFOL;  Surgeon: Lin Landsman, MD;  Location: [facility] ENDOSCOPY;  Service: Gastroenterology;  Laterality: N/A;  . ESOPHAGOGASTRODUODENOSCOPY (EGD) WITH PROPOFOL  05/09/2016   Procedure: ESOPHAGOGASTRODUODENOSCOPY (EGD) WITH PROPOFOL;  Surgeon: Jerene Bears, MD;  Location: [place] ENDOSCOPY;  Service: Endoscopy;;  . ESOPHAGOGASTRODUODENOSCOPY (EGD) WITH PROPOFOL N/A 06/16/2016   Procedure: ESOPHAGOGASTRODUODENOSCOPY (EGD) WITH PROPOFOL;  Surgeon:  [name], MD;  Location: [facility] ENDOSCOPY;  Service: Endoscopy;  Laterality: N/A;  . ESOPHAGOGASTRODUODENOSCOPY (EGD) WITH PROPOFOL N/A 05/05/2017   Procedure: ESOPHAGOGASTRODUODENOSCOPY (EGD) WITH PROPOFOL;  Surgeon: Manya Silvas, MD;  Location: [facility] ENDOSCOPY;  Service: Endoscopy;  Laterality: N/A;  . ESOPHAGOGASTRODUODENOSCOPY (EGD) WITH PROPOFOL N/A 06/15/2017   Procedure: ESOPHAGOGASTRODUODENOSCOPY (EGD) WITH PROPOFOL;  Surgeon: [name], MD;  Location: [facility] ENDOSCOPY;  Service: Endoscopy;   Laterality: N/A;  . GIVENS CAPSULE STUDY N/A 05/07/2016   Procedure: GIVENS CAPSULE STUDY;  Surgeon: Doran Stabler, MD;  Location: [place];  Service: Endoscopy;  Laterality: N/A;  . MANDIBULAR HARDWARE REMOVAL N/A 07/29/2013   Procedure: REMOVAL OF ARCH BARS;  Surgeon: Theodoro Kos, DO;  Location: [place];  Service: Plastics;  Laterality: N/A;  . ORIF MANDIBULAR FRACTURE N/A 06/05/2013   Procedure: REPAIR OF MANDIBULAR FRACTURE x 2 with maxillo-mandibular fixation ;  Surgeon: Theodoro Kos, DO;  Location: [place];  Service: Plastics;  Laterality: N/A;  . PERIPHERAL ARTERIAL STENT GRAFT Left     Allergies Patient has no known allergies.  Social History Social History   Tobacco Use  . Smoking status: Current Every Day Smoker    Packs/day: 0.15    Years: 40.00    Pack years: 6.00    Types: Cigarettes  . Smokeless tobacco: Never Used  Substance Use Topics  . Alcohol use: No    Comment: pt reports quitting after learning about cirrhosis  . Drug use: No    Frequency: 7.0 times per week    Types: Marijuana, Cocaine    Review of Systems Constitutional: Negative for fever. Cardiovascular: Negative for chest pain. Respiratory: Negative for shortness of breath. Gastrointestinal: Negative for abdominal pain, vomiting and diarrhea. Musculoskeletal: Negative for back pain. Skin: Negative for rash. Neurological: Negative for headaches, focal weakness or numbness.  All systems negative/normal/unremarkable except as stated in the HPI  ____________________________________________   PHYSICAL EXAM:  VITAL SIGNS: ED Triage Vitals  Enc Vitals Group     BP 06/28/17 1241 125/80     Pulse Rate 06/28/17 1241 77     Resp --      Temp 06/28/17 1241 98.1 F (36.7 C)     Temp Source 06/28/17 1241 Oral     SpO2 06/28/17 1241 100 %     Weight 06/28/17 1240 160 lb (72.6 kg)     Height 06/28/17 1240 6\' 3"  (1.905 m)     Head Circumference --      Peak Flow --       Pain Score 06/28/17 1240 0     Pain Loc --      Pain Edu? --      Excl. in [place]? --    Constitutional: Alert and oriented. Well appearing and in no distress. Eyes: Conjunctivae are normal. Normal extraocular movements. Cardiovascular: Normal rate, regular rhythm. No murmurs, rubs, or gallops. Respiratory: Normal respiratory effort without tachypnea nor retractions. Breath sounds are clear and equal bilaterally. No wheezes/rales/rhonchi. Gastrointestinal: Soft and nontender. Normal bowel sounds Musculoskeletal: Nontender with normal range of motion in extremities. No lower extremity tenderness nor edema. Neurologic:  Normal speech and language. No gross focal neurologic deficits are appreciated.  Skin:  Skin is warm, dry and intact. No rash noted. Psychiatric: Mood and affect are normal. Speech and behavior are normal.  ____________________________________________  ED COURSE:  As part of my medical decision making, I reviewed the following data within the [place] History obtained from  family if available, nursing notes, old chart and ekg, as well as notes from prior ED visits. Patient presented for dialysis, we will assess with labs and imaging as indicated at this time.   Procedures ____________________________________________   LABS (pertinent positives/negatives)  Labs Reviewed  PHOSPHORUS   ____________________________________________  DIFFERENTIAL DIAGNOSIS   End-stage renal disease on dialysis, electrolyte abnormality  FINAL ASSESSMENT AND PLAN  End-stage renal disease on dialysis   Plan: The patient had presented for dialysis, he is medically cleared for same.   Laurence Aly, MD   Note: This note was generated in part or whole with voice recognition software. Voice recognition is usually quite accurate but there are transcription errors that can and very often do occur. I apologize for any typographical errors that were not detected and  corrected.     Earleen Newport, MD 06/28/17 [phone]

## 2017-06-28 NOTE — Telephone Encounter (Signed)
Reached upon 2nd attempt 06/28/17.  Patient confirmed he has a PCP.  I thanked him for his time.

## 2017-06-28 NOTE — Progress Notes (Signed)
HD tx start   06/28/17 1404  Vital Signs  Pulse Rate 87  Pulse Rate Source Monitor  Resp (!) 21  BP 130/88  BP Location Left Arm  BP Method Automatic  Patient Position (if appropriate) Lying  Oxygen Therapy  SpO2 100 %  O2 Device Room Air  During Hemodialysis Assessment  Blood Flow Rate (mL/min) 400 mL/min  Arterial Pressure (mmHg) -140 mmHg  Venous Pressure (mmHg) 140 mmHg  Transmembrane Pressure (mmHg) 70 mmHg  Ultrafiltration Rate (mL/min) 860 mL/min  Dialysate Flow Rate (mL/min) 800 ml/min  Conductivity: Machine  15.2  HD Safety Checks Performed Yes  Dialysis Fluid Bolus Normal Saline  Bolus Amount (mL) 250 mL  Intra-Hemodialysis Comments Tx initiated  Hemodialysis Catheter Right Internal jugular Double-lumen  No Placement Date or Time found.   Placed prior to admission: Yes  Orientation: Right  Access Location: Internal jugular  Hemodialysis Catheter Type: Double-lumen  Blue Lumen Status Infusing  Red Lumen Status Infusing

## 2017-06-28 NOTE — Progress Notes (Signed)
Pre HD assessment    06/28/17 1400  Neurological  Level of Consciousness Alert  Orientation Level Oriented X4  Respiratory  Respiratory Pattern Regular;Unlabored  Chest Assessment Chest expansion symmetrical  Cough Non-productive  Cardiac  ECG Monitor Yes  Vascular  R Radial Pulse +2  L Radial Pulse +2  Integumentary  Integumentary (WDL) X  Skin Color Appropriate for ethnicity  Musculoskeletal  Musculoskeletal (WDL) X  Generalized Weakness Yes  Assistive Device None  GU Assessment  Genitourinary (WDL) X  Genitourinary Symptoms  (HD)  Psychosocial  Psychosocial (WDL) WDL

## 2017-06-28 NOTE — ED Notes (Signed)
Pt taken to dialysis by this RN on stretcher. Melissa RN called requesting ED transport.

## 2017-06-28 NOTE — Progress Notes (Signed)
Post HD assessment    06/28/17 1644  Neurological  Level of Consciousness Alert  Orientation Level Oriented X4  Respiratory  Respiratory Pattern Regular;Unlabored  Chest Assessment Chest expansion symmetrical  Cough Non-productive  Cardiac  ECG Monitor Yes  Vascular  R Radial Pulse +2  L Radial Pulse +2  Integumentary  Integumentary (WDL) X  Skin Color Appropriate for ethnicity  Musculoskeletal  Musculoskeletal (WDL) X  Generalized Weakness Yes  Assistive Device None  GU Assessment  Genitourinary (WDL) X  Genitourinary Symptoms  (HD)  Psychosocial  Psychosocial (WDL) WDL

## 2017-06-28 NOTE — Progress Notes (Signed)
HD tx end   06/28/17 1637  Vital Signs  Pulse Rate 89  Pulse Rate Source Monitor  Resp 16  BP 137/61  BP Location Left Arm  BP Method Automatic  Patient Position (if appropriate) Lying  Oxygen Therapy  SpO2 100 %  O2 Device Room Air  During Hemodialysis Assessment  Dialysis Fluid Bolus Normal Saline  Bolus Amount (mL) 250 mL  Intra-Hemodialysis Comments Tx completed

## 2017-06-28 NOTE — Progress Notes (Signed)
Pre HD assessment    06/28/17 1400  Vital Signs  Temp (!) 97.5 F (36.4 C)  Temp Source Oral  Pulse Rate 86  Pulse Rate Source Monitor  Resp 19  BP 136/88  BP Location Left Arm  BP Method Automatic  Patient Position (if appropriate) Lying  Oxygen Therapy  SpO2 100 %  O2 Device Room Air  Pain Assessment  Pain Scale 0-10  Pain Score 0  Dialysis Weight  Weight 72.6 kg (159 lb 15.8 oz)  Type of Weight Pre-Dialysis  Time-Out for Hemodialysis  What Procedure? HD  Pt Identifiers(min of two) First/Last Name;MRN/Account#  Correct Site? Yes  Correct Side? Yes  Correct Procedure? Yes  Consents Verified? Yes  Rad Studies Available? N/A  Safety Precautions Reviewed? Yes  Engineer, civil (consulting) Number  (3A)  Station Number 4  UF/Alarm Test Passed  Conductivity: Meter 13.8  Conductivity: Machine  14.2  pH 7.6  Reverse Osmosis main  Normal Saline Lot Number [id number]  Dialyzer Lot Number [id number]  Disposable Set Lot Number [id number]  Machine Temperature 98.6 F (37 C)  Musician and Audible Yes  Blood Lines Intact and Secured Yes  Pre Treatment Patient Checks  Vascular access used during treatment Catheter  Hepatitis B Surface Antigen Results Negative  Date Hepatitis B Surface Antigen Drawn 06/16/17  Hepatitis B Surface Antibody  (>10)  Date Hepatitis B Surface Antibody Drawn 03/01/17  Hemodialysis Consent Verified Yes  Hemodialysis Standing Orders Initiated Yes  ECG (Telemetry) Monitor On Yes  Prime Ordered Normal Saline  Length of  DialysisTreatment -hour(s) 3.5 Hour(s)  Dialyzer Elisio 17H NR  Dialysate 2K, 2.5 Ca  Dialysis Anticoagulant None  Dialysate Flow Ordered 800  Blood Flow Rate Ordered 400 mL/min  Ultrafiltration Goal 2 Liters  Pre Treatment Labs Phosphorus  Dialysis Blood Pressure Support Ordered Normal Saline  Education / Care Plan  Dialysis Education Provided Yes  Documented Education in Care Plan Yes  Hemodialysis Catheter Right Internal  jugular Double-lumen  No Placement Date or Time found.   Placed prior to admission: Yes  Orientation: Right  Access Location: Internal jugular  Hemodialysis Catheter Type: Double-lumen  Site Condition No complications  Blue Lumen Status Heparin locked  Red Lumen Status Heparin locked  Dressing Type Biopatch  Dressing Status Clean;Dry;Intact  Drainage Description None

## 2017-06-28 NOTE — Progress Notes (Signed)
Post HD assessment. Pt tolerated tx well without c/o or complications. Pt requested to be taken off early, MD aware. Net UF 1631, goal not met.    06/28/17 1643  Vital Signs  Temp 98.1 F (36.7 C)  Temp Source Oral  Pulse Rate 90  Pulse Rate Source Monitor  Resp 20  BP 132/78  BP Location Left Arm  BP Method Automatic  Patient Position (if appropriate) Lying  Oxygen Therapy  SpO2 100 %  O2 Device Room Air  Dialysis Weight  Weight  (unable to weigh pt , no scale available)  Type of Weight Post-Dialysis  Post-Hemodialysis Assessment  Rinseback Volume (mL) 250 mL  KECN 55.7 V  Dialyzer Clearance Lightly streaked  Duration of HD Treatment -hour(s) 2.5 hour(s)  Hemodialysis Intake (mL) 500 mL  UF Total -Machine (mL) 2131 mL  Net UF (mL) 1631 mL  Tolerated HD Treatment Yes  Education / Care Plan  Dialysis Education Provided Yes  Documented Education in Care Plan Yes  Hemodialysis Catheter Right Internal jugular Double-lumen  No Placement Date or Time found.   Placed prior to admission: Yes  Orientation: Right  Access Location: Internal jugular  Hemodialysis Catheter Type: Double-lumen  Site Condition No complications  Blue Lumen Status Heparin locked  Red Lumen Status Heparin locked  Catheter fill solution Heparin 1000 units/ml  Catheter fill volume (Arterial) 1.5 cc  Catheter fill volume (Venous) 1.5  Dressing Type Biopatch  Dressing Status Clean;Dry;Intact  Drainage Description None  Post treatment catheter status Capped and Clamped   

## 2017-06-30 ENCOUNTER — Emergency Department: Payer: Medicare Other

## 2017-06-30 ENCOUNTER — Emergency Department
Admission: EM | Admit: 2017-06-30 | Discharge: 2017-06-30 | Discharge status: Against Medical Advice | Payer: Medicare Other | Attending: Emergency Medicine | Admitting: Emergency Medicine

## 2017-06-30 ENCOUNTER — Encounter: Payer: Self-pay | Admitting: Emergency Medicine

## 2017-06-30 ENCOUNTER — Other Ambulatory Visit: Payer: Self-pay

## 2017-06-30 DIAGNOSIS — Z79899 Other long term (current) drug therapy: Secondary | ICD-10-CM | POA: Diagnosis not present

## 2017-06-30 DIAGNOSIS — R188 Other ascites: Secondary | ICD-10-CM | POA: Diagnosis not present

## 2017-06-30 DIAGNOSIS — F17210 Nicotine dependence, cigarettes, uncomplicated: Secondary | ICD-10-CM | POA: Insufficient documentation

## 2017-06-30 DIAGNOSIS — K7011 Alcoholic hepatitis with ascites: Secondary | ICD-10-CM | POA: Diagnosis not present

## 2017-06-30 DIAGNOSIS — N186 End stage renal disease: Secondary | ICD-10-CM | POA: Insufficient documentation

## 2017-06-30 DIAGNOSIS — Z951 Presence of aortocoronary bypass graft: Secondary | ICD-10-CM | POA: Diagnosis not present

## 2017-06-30 DIAGNOSIS — I132 Hypertensive heart and chronic kidney disease with heart failure and with stage 5 chronic kidney disease, or end stage renal disease: Secondary | ICD-10-CM | POA: Diagnosis not present

## 2017-06-30 DIAGNOSIS — J449 Chronic obstructive pulmonary disease, unspecified: Secondary | ICD-10-CM | POA: Diagnosis not present

## 2017-06-30 DIAGNOSIS — R0602 Shortness of breath: Secondary | ICD-10-CM | POA: Diagnosis not present

## 2017-06-30 DIAGNOSIS — R05 Cough: Secondary | ICD-10-CM | POA: Diagnosis not present

## 2017-06-30 DIAGNOSIS — I509 Heart failure, unspecified: Secondary | ICD-10-CM | POA: Insufficient documentation

## 2017-06-30 DIAGNOSIS — Z992 Dependence on renal dialysis: Secondary | ICD-10-CM | POA: Diagnosis not present

## 2017-06-30 DIAGNOSIS — I2510 Atherosclerotic heart disease of native coronary artery without angina pectoris: Secondary | ICD-10-CM | POA: Diagnosis not present

## 2017-06-30 LAB — CBC WITH DIFFERENTIAL/PLATELET
Basophils Absolute: 0.1 10*3/uL (ref 0–0.1)
Basophils Relative: 1 %
EOS ABS: 0.1 10*3/uL (ref 0–0.7)
EOS PCT: 2 %
HCT: 25.2 % — ABNORMAL LOW (ref 40.0–52.0)
Hemoglobin: 7.8 g/dL — ABNORMAL LOW (ref 13.0–18.0)
LYMPHS ABS: 0.8 10*3/uL — AB (ref 1.0–3.6)
Lymphocytes Relative: 11 %
MCH: 27.9 pg (ref 26.0–34.0)
MCHC: 30.9 g/dL — AB (ref 32.0–36.0)
MCV: 90.2 fL (ref 80.0–100.0)
MONOS PCT: 10 %
Monocytes Absolute: 0.7 10*3/uL (ref 0.2–1.0)
Neutro Abs: 5.6 10*3/uL (ref 1.4–6.5)
Neutrophils Relative %: 76 %
PLATELETS: 332 10*3/uL (ref 150–440)
RBC: 2.79 MIL/uL — AB (ref 4.40–5.90)
RDW: 18.5 % — ABNORMAL HIGH (ref 11.5–14.5)
WBC: 7.4 10*3/uL (ref 3.8–10.6)

## 2017-06-30 LAB — COMPREHENSIVE METABOLIC PANEL
ALBUMIN: 2.4 g/dL — AB (ref 3.5–5.0)
ALT: 16 U/L — AB (ref 17–63)
AST: 24 U/L (ref 15–41)
Alkaline Phosphatase: 192 U/L — ABNORMAL HIGH (ref 38–126)
Anion gap: 9 (ref 5–15)
BUN: 39 mg/dL — AB (ref 6–20)
CHLORIDE: 103 mmol/L (ref 101–111)
CO2: 28 mmol/L (ref 22–32)
CREATININE: 7.72 mg/dL — AB (ref 0.61–1.24)
Calcium: 8.2 mg/dL — ABNORMAL LOW (ref 8.9–10.3)
GFR calc Af Amer: 8 mL/min — ABNORMAL LOW (ref >60)
GFR calc non Af Amer: 7 mL/min — ABNORMAL LOW (ref >60)
GLUCOSE: 108 mg/dL — AB (ref 65–99)
POTASSIUM: 5.6 mmol/L — AB (ref 3.5–5.1)
SODIUM: 140 mmol/L (ref 135–145)
Total Bilirubin: 0.6 mg/dL (ref 0.3–1.2)
Total Protein: 7.2 g/dL (ref 6.5–8.1)

## 2017-06-30 LAB — TROPONIN I

## 2017-06-30 LAB — BRAIN NATRIURETIC PEPTIDE: B Natriuretic Peptide: 2787 pg/mL — ABNORMAL HIGH (ref 0.0–100.0)

## 2017-06-30 MED ORDER — ALBUMIN HUMAN 25 % IV SOLN: 12.5000 g | Freq: Once | INTRAVENOUS | Status: DC

## 2017-06-30 NOTE — ED Notes (Signed)
Pt states that he is here for his paracentesis today not dialysis.  NAD at this time.

## 2017-06-30 NOTE — ED Triage Notes (Signed)
Arrives today with c/o SOB x 1 day.  Patient states he is to have a paracentesis today and not have dialysis today.

## 2017-06-30 NOTE — ED Notes (Signed)
Patient transported to Ultrasound 

## 2017-06-30 NOTE — ED Notes (Signed)
Pt refusing to get albumin human solution. MD aware. MD Cinda Quest spoke with patient. Pt states he is ready to go because his ride is coming.

## 2017-06-30 NOTE — ED Provider Notes (Addendum)
[facility] Emergency Department Provider Note   ____________________________________________   First MD Initiated Contact with Patient 06/30/17 1221     (approximate)  I have reviewed the triage vital signs and the nursing notes.   HISTORY  Chief Complaint Shortness of Breath   HPI Stanley [name] is a 57 y.o. male who reports shortness of breath.  Patient says he is here for paracentesis today not dialysis.  He said he was getting it every week but he was not having much taken also is not getting every week anymore.  He says he is coughing up clear phlegm too.  He is not running a fever.  His belly is not tender he is not hurting anywhere he thinks he is short of breath because he is getting much fluid in his belly but as I said he is coughing up clear phlegm and does have some rattles in his chest.  Past Medical History:  Diagnosis Date  . Alcohol abuse   . CHF (congestive heart failure) ([place])   . Cirrhosis ([place])   . Coronary artery disease 2009  . Drug abuse ([place])   . End stage renal disease on dialysis [facility]) NEPHROLOGIST-   DR [facility]  IN [place]   HEMODIALYSIS --   TUES/  THURS/  SAT  . Gastrointestinal bleed 06/13/2017   From chart...hx of multiple GI bleeds  . GERD (gastroesophageal reflux disease)   . Hyperlipidemia   . Hypertension   . PAD (peripheral artery disease) ([place])   . Renal insufficiency    Per pt, 32 oz fluid restriction per day  . S/P triple vessel bypass 06/09/2016   2009ish  . Suicidal ideation    & HOMICIDAL IDEATION --  06-16-2013   ADMITTED TO BEHAVIOR HEALTH    Patient Active Problem List   Diagnosis Date Noted  . Protein-calorie malnutrition, severe 06/14/2017  . Encounter for dialysis [facility])   . Palliative care by specialist   . Goals of care, counseling/discussion   . Malnutrition of moderate degree 06/05/2017  . Secondary esophageal varices without bleeding ([place])   . Stomach irritation   . Idiopathic  esophageal varices without bleeding ([place])   . Alcoholic hepatitis with ascites 05/24/2017  . ESRD (end stage renal disease) ([place]) 04/28/2017  . Uremia 03/08/2017  . ESRD on hemodialysis ([place]) 03/03/2017  . Weakness 02/28/2017  . Hypocalcemia 02/22/2017  . Shortness of breath 11/26/2016  . COPD (chronic obstructive pulmonary disease) ([place]) 10/30/2016  . COPD exacerbation ([place]) 10/29/2016  . Anemia   . Heme positive stool   . Ulceration of intestine   . Benign neoplasm of transverse colon   . Acute gastrointestinal hemorrhage   . Esophageal candidiasis ([place])   . Angiodysplasia of intestinal tract   . Acute respiratory failure with hypoxia ([place]) 07/03/2016  . GI bleeding 06/24/2016  . Rectal bleeding 06/14/2016  . Anemia of chronic disease 06/01/2016  . MRSA carrier 06/01/2016  . Chronic renal failure 05/23/2016  . Ischemic heart disease 05/23/2016  . Angiodysplasia of small intestine   . Melena   . Small bowel bleed not requiring more than 4 units of blood in 24 hours, ICU, or surgery   . Anemia due to chronic blood loss   . Abdominal pain 05/05/2016  . Acute posthemorrhagic anemia 04/17/2016  . Gastrointestinal bleed 04/17/2016  . History of esophagogastroduodenoscopy (EGD) 04/17/2016  . Elevated troponin 04/17/2016  . Alcohol abuse 04/17/2016  . Upper GI bleed 01/19/2016  . Blood in  stool   . Angiodysplasia of stomach and duodenum with hemorrhage   . Gastritis   . Reflux esophagitis   . GI bleed 05/16/2015  . Acute GI bleeding   . Symptomatic anemia 04/30/2015  . HTN (hypertension) 04/06/2015  . GERD (gastroesophageal reflux disease) 04/06/2015  . HLD (hyperlipidemia) 04/06/2015  . Dyspnea 04/06/2015  . Cirrhosis of liver with ascites ([place]) 04/06/2015  . Ascites 04/06/2015  . GIB (gastrointestinal bleeding) 03/23/2015  . Homicidal ideation 06/19/2013  . Suicidal intent 06/19/2013  . Homicidal ideations 06/19/2013  . Hyperkalemia 06/16/2013  . Mandible fracture  ([place]) 06/05/2013  . Fracture, mandible ([place]) 06/02/2013  . Coronary atherosclerosis of native coronary artery 06/02/2013  . ESRD on dialysis ([place]) 06/02/2013  . Mandible open fracture ([place]) 06/02/2013    Past Surgical History:  Procedure Laterality Date  . A/V FISTULAGRAM Right 06/06/2017   Procedure: A/V FISTULAGRAM;  Surgeon: Katha Cabal, MD;  Location: [place] CV LAB;  Service: Cardiovascular;  Laterality: Right;  . A/V SHUNT INTERVENTION N/A 06/06/2017   Procedure: A/V SHUNT INTERVENTION;  Surgeon: Katha Cabal, MD;  Location: [place] CV LAB;  Service: Cardiovascular;  Laterality: N/A;  . AGILE CAPSULE N/A 06/19/2016   Procedure: AGILE CAPSULE;  Surgeon: [name], MD;  Location: [facility] ENDOSCOPY;  Service: Endoscopy;  Laterality: N/A;  . COLONOSCOPY WITH PROPOFOL N/A 06/18/2016   Procedure: COLONOSCOPY WITH PROPOFOL;  Surgeon: [name], MD;  Location: [facility] ENDOSCOPY;  Service: Endoscopy;  Laterality: N/A;  . COLONOSCOPY WITH PROPOFOL N/A 08/12/2016   Procedure: COLONOSCOPY WITH PROPOFOL;  Surgeon: [name], MD;  Location: [facility] ENDOSCOPY;  Service: Endoscopy;  Laterality: N/A;  . COLONOSCOPY WITH PROPOFOL N/A 05/05/2017   Procedure: COLONOSCOPY WITH PROPOFOL;  Surgeon: Manya Silvas, MD;  Location: [facility] ENDOSCOPY;  Service: Endoscopy;  Laterality: N/A;  . CORONARY ANGIOPLASTY  ?   PT UNABLE TO TELL IF  BEFORE OR AFTER  CABG  . CORONARY ARTERY BYPASS GRAFT  2008  ([place] , [place])   3 VESSEL  . DIALYSIS FISTULA CREATION  LAST SURGERY  APPOX  2008  . ENTEROSCOPY N/A 05/10/2016   Procedure: ENTEROSCOPY;  Surgeon: Jerene Bears, MD;  Location: [place];  Service: Gastroenterology;  Laterality: N/A;  . ENTEROSCOPY N/A 08/12/2016   Procedure: ENTEROSCOPY;  Surgeon: [name], MD;  Location: [facility] ENDOSCOPY;  Service: Endoscopy;  Laterality: N/A;  . ENTEROSCOPY Left 06/03/2017   Procedure: ENTEROSCOPY;  Surgeon: Virgel Manifold, MD;  Location: [facility] ENDOSCOPY;   Service: Endoscopy;  Laterality: Left;  Procedure date will ultimately depend on when patient is medically optimized before the procedure, pending hemodialysis and blood transfusions etc. Will place on schedule and change depending on clinical status.   . ENTEROSCOPY N/A 06/05/2017   Procedure: ENTEROSCOPY;  Surgeon: Virgel Manifold, MD;  Location: [facility] ENDOSCOPY;  Service: Endoscopy;  Laterality: N/A;  . ENTEROSCOPY N/A 06/15/2017   Procedure: Push ENTEROSCOPY;  Surgeon: [name], MD;  Location: [facility] ENDOSCOPY;  Service: Endoscopy;  Laterality: N/A;  . ESOPHAGOGASTRODUODENOSCOPY N/A 05/07/2015   Procedure: ESOPHAGOGASTRODUODENOSCOPY (EGD);  Surgeon: Hulen Luster, MD;  Location: [facility] ENDOSCOPY;  Service: Endoscopy;  Laterality: N/A;  . ESOPHAGOGASTRODUODENOSCOPY (EGD) WITH PROPOFOL N/A 05/17/2015   Procedure: ESOPHAGOGASTRODUODENOSCOPY (EGD) WITH PROPOFOL;  Surgeon: [name], MD;  Location: [facility] ENDOSCOPY;  Service: Endoscopy;  Laterality: N/A;  . ESOPHAGOGASTRODUODENOSCOPY (EGD) WITH PROPOFOL N/A 01/20/2016   Procedure: ESOPHAGOGASTRODUODENOSCOPY (EGD) WITH PROPOFOL;  Surgeon: [name], MD;  Location: [facility] ENDOSCOPY;  Service: Endoscopy;  Laterality: N/A;  . ESOPHAGOGASTRODUODENOSCOPY (EGD) WITH PROPOFOL N/A 04/17/2016   Procedure: ESOPHAGOGASTRODUODENOSCOPY (EGD) WITH PROPOFOL;  Surgeon: Lin Landsman, MD;  Location: [facility] ENDOSCOPY;  Service: Gastroenterology;  Laterality: N/A;  . ESOPHAGOGASTRODUODENOSCOPY (EGD) WITH PROPOFOL  05/09/2016   Procedure: ESOPHAGOGASTRODUODENOSCOPY (EGD) WITH PROPOFOL;  Surgeon: Jerene Bears, MD;  Location: [place];  Service: Endoscopy;;  . ESOPHAGOGASTRODUODENOSCOPY (EGD) WITH PROPOFOL N/A 06/16/2016   Procedure: ESOPHAGOGASTRODUODENOSCOPY (EGD) WITH PROPOFOL;  Surgeon: [name], MD;  Location: [facility] ENDOSCOPY;  Service: Endoscopy;  Laterality: N/A;  . ESOPHAGOGASTRODUODENOSCOPY (EGD) WITH PROPOFOL N/A 05/05/2017   Procedure: ESOPHAGOGASTRODUODENOSCOPY  (EGD) WITH PROPOFOL;  Surgeon: Manya Silvas, MD;  Location: [facility] ENDOSCOPY;  Service: Endoscopy;  Laterality: N/A;  . ESOPHAGOGASTRODUODENOSCOPY (EGD) WITH PROPOFOL N/A 06/15/2017   Procedure: ESOPHAGOGASTRODUODENOSCOPY (EGD) WITH PROPOFOL;  Surgeon: [name], MD;  Location: [facility] ENDOSCOPY;  Service: Endoscopy;  Laterality: N/A;  . GIVENS CAPSULE STUDY N/A 05/07/2016   Procedure: GIVENS CAPSULE STUDY;  Surgeon: Doran Stabler, MD;  Location: [place];  Service: Endoscopy;  Laterality: N/A;  . MANDIBULAR HARDWARE REMOVAL N/A 07/29/2013   Procedure: REMOVAL OF ARCH BARS;  Surgeon: Theodoro Kos, DO;  Location: [place];  Service: Plastics;  Laterality: N/A;  . ORIF MANDIBULAR FRACTURE N/A 06/05/2013   Procedure: REPAIR OF MANDIBULAR FRACTURE x 2 with maxillo-mandibular fixation ;  Surgeon: Theodoro Kos, DO;  Location: [place];  Service: Plastics;  Laterality: N/A;  . PERIPHERAL ARTERIAL STENT GRAFT Left     Prior to Admission medications   Medication Sig Start Date End Date Taking? Authorizing Provider  albuterol (PROVENTIL HFA;VENTOLIN HFA) 108 (90 Base) MCG/ACT inhaler Inhale 2 puffs into the lungs every 6 (six) hours as needed for wheezing or shortness of breath. 04/21/17   Lisa Roca, MD  alum & mag hydroxide-simeth (MAALOX/MYLANTA) 200-200-20 MG/5ML suspension Take 15 mLs every 4 (four) hours as needed by mouth for indigestion or heartburn. Patient not taking: Reported on 04/19/2017 01/25/17   Demetrios Loll, MD  atorvastatin (LIPITOR) 40 MG tablet Take 1 tablet (40 mg total) by mouth daily. 11/16/16   Vaughan Basta, MD  budesonide-formoterol (SYMBICORT) 160-4.5 MCG/ACT inhaler Inhale 2 puffs into the lungs daily. 11/16/16   Vaughan Basta, MD  calcium acetate (PHOSLO) 667 MG capsule Take 2,001 mg by mouth 3 (three) times daily.    [provider]  ferrous sulfate 325 (65 FE) MG tablet Take 1 tablet by mouth 3 (three) times daily. 01/21/17    [provider]  furosemide (LASIX) 80 MG tablet Take 1 tablet (80 mg total) daily by mouth. 01/25/17   Demetrios Loll, MD  gabapentin (NEURONTIN) 300 MG capsule Take 1 capsule by mouth daily as directed 08/16/16   [provider]  ipratropium-albuterol (DUONEB) 0.5-2.5 (3) MG/3ML SOLN Take 3 mLs by nebulization every 6 (six) hours as needed. 11/28/16   Loletha Grayer, MD  labetalol (NORMODYNE) 100 MG tablet Take 100 mg by mouth daily. 03/24/17   [provider]  Multiple Vitamins-Minerals-FA (DIALYVITE SUPREME D) 3 MG TABS Take 1 tablet (3 mg total) by mouth daily. 11/28/16   Loletha Grayer, MD  nitroGLYCERIN (NITROSTAT) 0.4 MG SL tablet Place 1 tablet (0.4 mg total) under the tongue every 5 (five) minutes as needed. 04/12/16   Wende Bushy, MD  pantoprazole (PROTONIX) 40 MG tablet Take 1 tablet (40 mg total) by mouth daily. 11/28/16   Loletha Grayer, MD  Spacer/Aero Chamber Mouthpiece MISC 1 Units by Does not apply route every  4 (four) hours as needed (wheezing). 02/19/17   Darel Hong, MD  tiotropium (SPIRIVA HANDIHALER) 18 MCG inhalation capsule Place 1 capsule (18 mcg total) into inhaler and inhale daily. 11/16/16 11/16/17  Vaughan Basta, MD    Allergies Patient has no known allergies.  Family History  Problem Relation Age of Onset  . Colon cancer Mother   . Cancer Father   . Cancer Sister   . Kidney disease Brother     Social History Social History   Tobacco Use  . Smoking status: Current Every Day Smoker    Packs/day: 0.15    Years: 40.00    Pack years: 6.00    Types: Cigarettes  . Smokeless tobacco: Never Used  Substance Use Topics  . Alcohol use: No    Comment: pt reports quitting after learning about cirrhosis  . Drug use: No    Frequency: 7.0 times per week    Types: Marijuana, Cocaine    Review of Systems  Constitutional: No fever/chills Eyes: No visual changes. ENT: No sore throat. Cardiovascular: Denies chest  pain. Respiratory:  shortness of breath. Gastrointestinal: No abdominal pain.  No nausea, no vomiting.  No diarrhea.  No constipation. Genitourinary: Negative for dysuria. Musculoskeletal: Negative for back pain. Skin: Negative for rash. Neurological: Negative for headaches, focal weakness  ____________________________________________   PHYSICAL EXAM:  VITAL SIGNS: ED Triage Vitals  Enc Vitals Group     BP 06/30/17 1211 124/79     Pulse Rate 06/30/17 1211 84     Resp 06/30/17 1211 20     Temp 06/30/17 1211 98 F (36.7 C)     Temp Source 06/30/17 1211 Oral     SpO2 06/30/17 1211 100 %     Weight 06/30/17 1209 159 lb (72.1 kg)     Height 06/30/17 1209 6\' 3"  (1.905 m)     Head Circumference --      Peak Flow --      Pain Score 06/30/17 1209 0     Pain Loc --      Pain Edu? --      Excl. in [place]? --     Constitutional: Alert and oriented. Well appearing and in no acute distress. Eyes: Conjunctivae are normal.  Head: Atraumatic. Nose: No congestion/rhinnorhea. Mouth/Throat: Mucous membranes are moist.  Oropharynx non-erythematous. Neck: No stridor.   Cardiovascular: Normal rate, regular rhythm. Grossly normal heart sounds.  Good peripheral circulation. Respiratory: Normal respiratory effort.  No retractions. Lungs occasional scattered crackle Gastrointestinal: Soft and nontender.  Marked distention. No abdominal bruits. No CVA tenderness. Musculoskeletal: No lower extremity tenderness trace edema.  No joint effusions. Neurologic:  Normal speech and language. No gross focal neurologic deficits are appreciated. Skin:  Skin is warm, dry and intact. No rash noted. Psychiatric: Mood and affect are normal. Speech and behavior are normal.  ____________________________________________   LABS (all labs ordered are listed, but only abnormal results are displayed)  Labs Reviewed  COMPREHENSIVE METABOLIC PANEL - Abnormal; Notable for the following components:      Result Value    Potassium 5.6 (*)    Glucose, Bld 108 (*)    BUN 39 (*)    Creatinine, Ser 7.72 (*)    Calcium 8.2 (*)    Albumin 2.4 (*)    ALT 16 (*)    Alkaline Phosphatase 192 (*)    GFR calc non Af Amer 7 (*)    GFR calc Af Amer 8 (*)    All other components within  normal limits  CBC WITH DIFFERENTIAL/PLATELET - Abnormal; Notable for the following components:   RBC 2.79 (*)    Hemoglobin 7.8 (*)    HCT 25.2 (*)    MCHC 30.9 (*)    RDW 18.5 (*)    Lymphs Abs 0.8 (*)    All other components within normal limits  BRAIN NATRIURETIC PEPTIDE - Abnormal; Notable for the following components:   B Natriuretic Peptide 2,787.0 (*)    All other components within normal limits  TROPONIN I   ____________________________________________  EKG EKG read and interpret by me shows normal sinus rhythm rate of 85 rightward axis no acute ST-T wave changes there are some lateral and inferior ST abnormalities that are similar although not the same as prior EKGs.  ____________________________________________  RADIOLOGY  ED MD interpretation:    Official radiology report(s): Dg Chest 2 View  Result Date: 06/30/2017 CLINICAL DATA:  Shortness of breath for 1 day. Chronic cough. History of CHF. EXAM: CHEST - 2 VIEW COMPARISON:  Chest radiograph and CT 06/03/2017 FINDINGS: Sequelae of prior CABG are again identified. A right jugular dialysis catheter has been placed and terminates over the lower SVC. The cardiac silhouette remains enlarged. Aortic atherosclerosis is noted. Compared to the prior radiograph, there is increased pulmonary vascular congestion with mildly increased interstitial markings diffusely as well as patchy opacities in the perihilar regions. No pleural effusion or pneumothorax is identified. Old right lower rib fractures are noted. IMPRESSION: Cardiomegaly with pulmonary vascular congestion and bilateral lung opacities likely reflecting mild edema. Electronically Signed   By: Logan Bores M.D.   On:  06/30/2017 12:48    ____________________________________________   PROCEDURES  Procedure(s) performed:   Procedures  Critical Care performed:   ____________________________________________   INITIAL IMPRESSION / ASSESSMENT AND PLAN / ED COURSE  Patient has his paracentesis feels much better will not wait for his albumin will not wait to get dialysis I explained to him that his potassium is somewhat elevated could be dangerous to go home but he says he knows that he will be back on Monday for dialysis unless he ends up in a better place in the feedings up in a better place than that is good with him he will not stay.  He will not even wait for instructions      ____________________________________________   FINAL CLINICAL IMPRESSION(S) / ED DIAGNOSES  Final diagnoses:  Ascites     ED Discharge Orders    None       Note:  This document was prepared using Dragon voice recognition software and may include unintentional dictation errors.    Nena Polio, MD 06/30/17 1614    Nena Polio, MD 06/30/17 1616    Nena Polio, MD 06/30/17 [phone]

## 2017-06-30 NOTE — ED Notes (Signed)
Notified u/s that pt is ready.

## 2017-06-30 NOTE — Discharge Instructions (Signed)
Patient will not wait for instructions

## 2017-06-30 NOTE — ED Notes (Signed)
Pt ambulatory to discharge. Pt has no complaints at this time

## 2017-06-30 NOTE — ED Notes (Signed)
First Nurse Note: Pt ambulatory to front desk c/o shortness of breath. Pt states that his last dialysis treatment was on Wednesday. Pt able to speak in complete sentences. Pt in NAD

## 2017-07-03 ENCOUNTER — Emergency Department
Admission: EM | Admit: 2017-07-03 | Discharge: 2017-07-03 | Disposition: A | Discharge status: Home / Self Care | Payer: Medicare Other | Attending: Emergency Medicine | Admitting: Emergency Medicine

## 2017-07-03 ENCOUNTER — Encounter: Payer: Self-pay | Admitting: Medical Oncology

## 2017-07-03 DIAGNOSIS — D631 Anemia in chronic kidney disease: Secondary | ICD-10-CM | POA: Diagnosis not present

## 2017-07-03 DIAGNOSIS — Z951 Presence of aortocoronary bypass graft: Secondary | ICD-10-CM | POA: Insufficient documentation

## 2017-07-03 DIAGNOSIS — F17210 Nicotine dependence, cigarettes, uncomplicated: Secondary | ICD-10-CM | POA: Insufficient documentation

## 2017-07-03 DIAGNOSIS — D649 Anemia, unspecified: Secondary | ICD-10-CM | POA: Diagnosis not present

## 2017-07-03 DIAGNOSIS — N186 End stage renal disease: Secondary | ICD-10-CM | POA: Insufficient documentation

## 2017-07-03 DIAGNOSIS — I120 Hypertensive chronic kidney disease with stage 5 chronic kidney disease or end stage renal disease: Secondary | ICD-10-CM | POA: Diagnosis not present

## 2017-07-03 DIAGNOSIS — Z79899 Other long term (current) drug therapy: Secondary | ICD-10-CM | POA: Diagnosis not present

## 2017-07-03 DIAGNOSIS — J449 Chronic obstructive pulmonary disease, unspecified: Secondary | ICD-10-CM | POA: Insufficient documentation

## 2017-07-03 DIAGNOSIS — E875 Hyperkalemia: Secondary | ICD-10-CM | POA: Diagnosis not present

## 2017-07-03 DIAGNOSIS — I132 Hypertensive heart and chronic kidney disease with heart failure and with stage 5 chronic kidney disease, or end stage renal disease: Secondary | ICD-10-CM | POA: Diagnosis not present

## 2017-07-03 DIAGNOSIS — Z992 Dependence on renal dialysis: Secondary | ICD-10-CM | POA: Insufficient documentation

## 2017-07-03 DIAGNOSIS — I2510 Atherosclerotic heart disease of native coronary artery without angina pectoris: Secondary | ICD-10-CM | POA: Diagnosis not present

## 2017-07-03 DIAGNOSIS — I509 Heart failure, unspecified: Secondary | ICD-10-CM | POA: Insufficient documentation

## 2017-07-03 LAB — RENAL FUNCTION PANEL
ALBUMIN: 2.2 g/dL — AB (ref 3.5–5.0)
Anion gap: 11 (ref 5–15)
BUN: 64 mg/dL — AB (ref 6–20)
CO2: 27 mmol/L (ref 22–32)
CREATININE: 10.59 mg/dL — AB (ref 0.61–1.24)
Calcium: 8.1 mg/dL — ABNORMAL LOW (ref 8.9–10.3)
Chloride: 100 mmol/L — ABNORMAL LOW (ref 101–111)
GFR calc Af Amer: 5 mL/min — ABNORMAL LOW (ref >60)
GFR, EST NON AFRICAN AMERICAN: 5 mL/min — AB (ref >60)
GLUCOSE: 109 mg/dL — AB (ref 65–99)
PHOSPHORUS: 7 mg/dL — AB (ref 2.5–4.6)
Potassium: 6.2 mmol/L — ABNORMAL HIGH (ref 3.5–5.1)
Sodium: 138 mmol/L (ref 135–145)

## 2017-07-03 LAB — CBC
HEMATOCRIT: 25.7 % — AB (ref 40.0–52.0)
Hemoglobin: 7.9 g/dL — ABNORMAL LOW (ref 13.0–18.0)
MCH: 27.3 pg (ref 26.0–34.0)
MCHC: 30.9 g/dL — AB (ref 32.0–36.0)
MCV: 88.2 fL (ref 80.0–100.0)
PLATELETS: 328 10*3/uL (ref 150–440)
RBC: 2.91 MIL/uL — ABNORMAL LOW (ref 4.40–5.90)
RDW: 18.3 % — AB (ref 11.5–14.5)
WBC: 7.1 10*3/uL (ref 3.8–10.6)

## 2017-07-03 MED ORDER — DIPHENHYDRAMINE HCL 50 MG/ML IJ SOLN
25.0000 mg | Freq: Once | INTRAMUSCULAR | Status: AC
  Administered 2017-07-03: 25 mg via INTRAVENOUS

## 2017-07-03 MED ORDER — EPOETIN ALFA 10000 UNIT/ML IJ SOLN
10000.0000 [IU] | Freq: Once | INTRAMUSCULAR | Status: AC
Start: 2017-07-03 — End: 2017-07-03
  Administered 2017-07-03: 10000 [IU] via INTRAVENOUS
  Filled 2017-07-03: qty 1

## 2017-07-03 NOTE — Progress Notes (Signed)
[facility]  ROUNDING NOTE   Subjective:   Seen and examined on hemodialysis. Tolerating treatment well. K 5.6    HEMODIALYSIS FLOWSHEET:  Blood Flow Rate (mL/min): 400 mL/min Arterial Pressure (mmHg): -160 mmHg Venous Pressure (mmHg): 130 mmHg Transmembrane Pressure (mmHg): 60 mmHg Ultrafiltration Rate (mL/min): 860 mL/min Dialysate Flow Rate (mL/min): 600 ml/min Conductivity: Machine : 15.4 Conductivity: Machine : 15.4 Dialysis Fluid Bolus: Normal Saline Bolus Amount (mL): 250 mL    Objective:  Vital signs in last 24 hours:  Temp:  [97.8 F (36.6 C)-98 F (36.7 C)] 98 F (36.7 C) (04/22 1525) Pulse Rate:  [77-84] 77 (04/22 1615) Resp:  [17-20] 17 (04/22 1615) BP: (137-163)/(81-102) 141/87 (04/22 1615) SpO2:  [97 %-100 %] 97 % (04/22 1615) Weight:  [72 kg (158 lb 11.7 oz)-72.1 kg (159 lb)] 72 kg (158 lb 11.7 oz) (04/22 1525)  Weight change:  Filed Weights   07/03/17 1431 07/03/17 1525  Weight: 72.1 kg (159 lb) 72 kg (158 lb 11.7 oz)    Intake/Output: No intake/output data recorded.   Intake/Output this shift:  No intake/output data recorded.  Physical Exam: General: NAD  Head: Normocephalic, atraumatic. Moist oral mucosal membranes  Eyes: Anicteric  Neck: Supple  Lungs:  Clear to auscultation, normal effort  Heart: Regular rate and rhythm  Abdomen:  Soft, nontender, distended  Extremities:  peripheral edema.  Neurologic: Nonfocal, moving all four extremities  Skin: No lesions  Access: RUE AVF - no bruit or thrill, RIJ permcath    Basic Metabolic Panel: Recent Labs  Lab 06/28/17 1252 06/30/17 1412  NA  --  140  K  --  5.6*  CL  --  103  CO2  --  28  GLUCOSE  --  108*  BUN  --  39*  CREATININE  --  7.72*  CALCIUM  --  8.2*  PHOS 4.8*  --     Liver Function Tests: Recent Labs  Lab 06/30/17 1412  AST 24  ALT 16*  ALKPHOS 192*  BILITOT 0.6  PROT 7.2  ALBUMIN 2.4*   No results for input(s): LIPASE, AMYLASE in the last  168 hours. No results for input(s): AMMONIA in the last 168 hours.  CBC: Recent Labs  Lab 06/30/17 1412  WBC 7.4  NEUTROABS 5.6  HGB 7.8*  HCT 25.2*  MCV 90.2  PLT 332    Cardiac Enzymes: Recent Labs  Lab 06/30/17 1412  TROPONINI <0.03    BNP: Invalid input(s): POCBNP  CBG: No results for input(s): GLUCAP in the last 168 hours.  Microbiology: Results for orders placed or performed during the hospital encounter of 06/02/17  MRSA PCR Screening     Status: None   Collection Time: 06/02/17  4:22 PM  Result Value Ref Range Status   MRSA by PCR NEGATIVE NEGATIVE Final    Comment:        The GeneXpert MRSA Assay (FDA approved for NASAL specimens only), is one component of a comprehensive MRSA colonization surveillance program. It is not intended to diagnose MRSA infection nor to guide or monitor treatment for MRSA infections. Performed at [facility], [address] [place], [place] [address]   Body fluid culture     Status: None   Collection Time: 06/05/17 10:28 AM  Result Value Ref Range Status   Specimen Description   Final    PERITONEAL Performed at [facility], [address], [place], [place] [address]    Special Requests   Final  NONE Performed at [facility], [place], [place] [address]    Gram Stain   Final    WBC PRESENT,BOTH PMN AND MONONUCLEAR NO ORGANISMS SEEN CYTOSPIN SMEAR    Culture   Final    NO GROWTH 3 DAYS Performed at [facility], [place] [address] [place], [place] [address]    Report Status 06/08/2017 FINAL  Final    Coagulation Studies: No results for input(s): LABPROT, INR in the last 72 hours.  Urinalysis: No results for input(s): COLORURINE, LABSPEC, PHURINE, GLUCOSEU, HGBUR, BILIRUBINUR, KETONESUR, PROTEINUR, UROBILINOGEN, NITRITE, LEUKOCYTESUR in the last 72 hours.  Invalid input(s): APPERANCEUR    Imaging: No results found.   Medications:        Assessment/ Plan:  [name] is a 57 y.o. black male withend stage renal disease on hemodialysis secondary to Al[place]'s syndrome, ascites, hypertension, anemia of chronic kidney disease, coronary artery disease, peripheral vascular disease, hyperlipidemia, gastrointestinal AVMs, pulmonary hypertension,   nooutpatientdialysis unit currently  1.  Hyperkalemia Hemodialysis treatment  2.  End-stage renal disease: MWF schedule.  Seen and examined on hemodialysis. Tolerating treatment well.   3. Anemia with chronic kidney disease: acute blood loss from GI bleed/epistaxis.   Hemoglobin 7.8 EPO with HD  4. Dialysis device complication: AVF nonfunction. Currently using RIJ permcath Needs evaluation for a new access.    LOS: 0 Stanley [name] 4/22/20194:28 PM

## 2017-07-03 NOTE — Progress Notes (Signed)
Pre HD assessment    07/03/17 1526  Neurological  Level of Consciousness Alert  Orientation Level Oriented X4  Respiratory  Respiratory Pattern Regular;Dyspnea with exertion  Chest Assessment Chest expansion symmetrical  Cough Non-productive  Cardiac  ECG Monitor Yes  Vascular  R Radial Pulse +2  L Radial Pulse +2  Integumentary  Integumentary (WDL) X  Skin Color Appropriate for ethnicity  Musculoskeletal  Musculoskeletal (WDL) X  Generalized Weakness Yes  Assistive Device None  GU Assessment  Genitourinary (WDL) X  Genitourinary Symptoms  (HD)  Psychosocial  Psychosocial (WDL) WDL

## 2017-07-03 NOTE — Progress Notes (Signed)
Post HD assessment    07/03/17 1921  Neurological  Level of Consciousness Alert  Orientation Level Oriented X4  Respiratory  Respiratory Pattern Regular;Dyspnea with exertion  Chest Assessment Chest expansion symmetrical  Cough Non-productive  Cardiac  ECG Monitor Yes  Vascular  R Radial Pulse +2  L Radial Pulse +2  Integumentary  Integumentary (WDL) X  Skin Color Appropriate for ethnicity  Musculoskeletal  Musculoskeletal (WDL) X  Generalized Weakness Yes  Assistive Device None  GU Assessment  Genitourinary (WDL) X  Genitourinary Symptoms  (HD)  Psychosocial  Psychosocial (WDL) WDL

## 2017-07-03 NOTE — Progress Notes (Signed)
Pt did not want to wait on ED transportation. Pt stated that his ride has been waiting and they were going to leave him. Pt walked out of the dialysis unit at [place]. RN notified.

## 2017-07-03 NOTE — ED Notes (Signed)
Pt transported to dialysis at this time.

## 2017-07-03 NOTE — Progress Notes (Signed)
Pre HD assessment   07/03/17 1525  Vital Signs  Temp 98 F (36.7 C)  Temp Source Oral  Pulse Rate 81  Pulse Rate Source Monitor  Resp 20  BP (!) 148/97  BP Location Left Arm  BP Method Automatic  Patient Position (if appropriate) Lying  Oxygen Therapy  SpO2 100 %  O2 Device Room Air  Pain Assessment  Pain Scale 0-10  Pain Score 0  Dialysis Weight  Weight 72 kg (158 lb 11.7 oz)  Type of Weight Pre-Dialysis  Time-Out for Hemodialysis  What Procedure? HD  Pt Identifiers(min of two) First/Last Name;MRN/Account#  Correct Site? Yes  Correct Side? Yes  Correct Procedure? Yes  Consents Verified? Yes  Rad Studies Available? N/A  Safety Precautions Reviewed? Yes  Engineer, civil (consulting) Number  ([id number])  Station Number 3  UF/Alarm Test Passed  Conductivity: Meter 14.2  Conductivity: Machine  14.3  pH 7.4  Reverse Osmosis main  Normal Saline Lot Number [id number]  Dialyzer Lot Number [id number]  Disposable Set Lot Number [id number]  Machine Temperature 98.6 F (37 C)  Musician and Audible Yes  Blood Lines Intact and Secured Yes  Pre Treatment Patient Checks  Vascular access used during treatment Catheter  Patient is receiving dialysis in a chair Yes  Hepatitis B Surface Antigen Results Negative  Date Hepatitis B Surface Antigen Drawn 06/16/17  Hepatitis B Surface Antibody  (>10)  Date Hepatitis B Surface Antibody Drawn 03/01/17  Hemodialysis Consent Verified Yes  Hemodialysis Standing Orders Initiated Yes  ECG (Telemetry) Monitor On Yes  Prime Ordered Normal Saline  Length of  DialysisTreatment -hour(s) 3.5 Hour(s)  Dialyzer Elisio 17H NR  Dialysate 2K, 2.5 Ca  Dialysis Anticoagulant None  Dialysate Flow Ordered 600  Blood Flow Rate Ordered 400 mL/min  Ultrafiltration Goal 2.5 Liters  Pre Treatment Labs Renal panel;CBC  Dialysis Blood Pressure Support Ordered Normal Saline  Education / Care Plan  Dialysis Education Provided Yes  Documented Education in Care Plan  Yes  Hemodialysis Catheter Right Internal jugular Double-lumen  No Placement Date or Time found.   Placed prior to admission: Yes  Orientation: Right  Access Location: Internal jugular  Hemodialysis Catheter Type: Double-lumen  Site Condition No complications  Blue Lumen Status Heparin locked  Red Lumen Status Heparin locked  Purple Lumen Status N/A  Dressing Type Biopatch  Dressing Status Clean;Dry;Intact  Drainage Description None

## 2017-07-03 NOTE — ED Triage Notes (Signed)
Pt here for dialysis, was last here Friday for paracentesis, did not have dialysis Friday. States feels fine.

## 2017-07-03 NOTE — Progress Notes (Signed)
HD tx end    07/03/17 1905  Vital Signs  Pulse Rate 90  Pulse Rate Source Monitor  Resp 17  BP 138/84  BP Location Left Arm  BP Method Automatic  Patient Position (if appropriate) Lying  Oxygen Therapy  SpO2 98 %  O2 Device Room Air  During Hemodialysis Assessment  Dialysis Fluid Bolus Normal Saline  Bolus Amount (mL) 250 mL  Intra-Hemodialysis Comments Tx completed

## 2017-07-03 NOTE — Progress Notes (Signed)
Post HD assessment . Pt tolerated tx well without c/o or complications. Net UF 2511, goal met.    07/03/17 1918  Vital Signs  Temp 98.4 F (36.9 C)  Temp Source Oral  Pulse Rate 87  Pulse Rate Source Monitor  Resp 18  BP 121/71  BP Location Left Arm  BP Method Automatic  Patient Position (if appropriate) Lying  Oxygen Therapy  SpO2 97 %  O2 Device Room Air  Dialysis Weight  Weight  (unable to weigh pt, in chair, no scale available)  Type of Weight Post-Dialysis  Post-Hemodialysis Assessment  Rinseback Volume (mL) 250 mL  KECN 79.3 V  Dialyzer Clearance Lightly streaked  Duration of HD Treatment -hour(s) 3.5 hour(s)  Hemodialysis Intake (mL) 500 mL  UF Total -Machine (mL) 3011 mL  Net UF (mL) 2511 mL  Tolerated HD Treatment Yes  Education / Care Plan  Dialysis Education Provided Yes  Documented Education in Care Plan Yes  Hemodialysis Catheter Right Internal jugular Double-lumen  No Placement Date or Time found.   Placed prior to admission: Yes  Orientation: Right  Access Location: Internal jugular  Hemodialysis Catheter Type: Double-lumen  Site Condition No complications  Blue Lumen Status Heparin locked  Red Lumen Status Heparin locked  Purple Lumen Status N/A  Catheter fill solution Heparin 1000 units/ml  Catheter fill volume (Arterial) 1.5 cc  Catheter fill volume (Venous) 1.5  Dressing Type Biopatch  Dressing Status Clean;Dry;Intact;Dressing changed  Interventions New dressing  Drainage Description None  Dressing Change Due 07/10/17  Post treatment catheter status Capped and Clamped

## 2017-07-03 NOTE — Progress Notes (Signed)
HD tx start    07/03/17 1530  Vital Signs  Pulse Rate 84  Pulse Rate Source Monitor  Resp 20  BP (!) 163/102  BP Location Left Arm  BP Method Automatic  Patient Position (if appropriate) Lying  Oxygen Therapy  SpO2 99 %  O2 Device Room Air  During Hemodialysis Assessment  Blood Flow Rate (mL/min) 400 mL/min  Arterial Pressure (mmHg) -160 mmHg  Venous Pressure (mmHg) 140 mmHg  Transmembrane Pressure (mmHg) 60 mmHg  Ultrafiltration Rate (mL/min) 860 mL/min  Dialysate Flow Rate (mL/min) 600 ml/min  Conductivity: Machine  14.3  HD Safety Checks Performed Yes  Dialysis Fluid Bolus Normal Saline  Bolus Amount (mL) 250 mL  Intra-Hemodialysis Comments Tx initiated  Hemodialysis Catheter Right Internal jugular Double-lumen  No Placement Date or Time found.   Placed prior to admission: Yes  Orientation: Right  Access Location: Internal jugular  Hemodialysis Catheter Type: Double-lumen  Blue Lumen Status Infusing  Red Lumen Status Infusing

## 2017-07-03 NOTE — ED Provider Notes (Signed)
now.  [facility] Emergency Department Provider Note   ____________________________________________   First MD Initiated Contact with Patient 07/03/17 1435     (approximate)  I have reviewed the triage vital signs and the nursing notes.   HISTORY  Chief Complaint dialysis    HPI [place] [name] is a 57 y.o. male who comes in for dialysis today.  He was seen on Friday and had paracentesis with drainage of his ascites.  He feels fine today.  His potassium on Friday was 5.6 but again he feels fine.  I discussed him with renal and they will take him downstairs for dialysis and do his lab work there.   Past Medical History:  Diagnosis Date  . Alcohol abuse   . CHF (congestive heart failure) ([place])   . Cirrhosis ([place])   . Coronary artery disease 2009  . Drug abuse ([place])   . End stage renal disease on dialysis [facility]) NEPHROLOGIST-   DR [facility]  IN [place]   HEMODIALYSIS --   TUES/  THURS/  SAT  . Gastrointestinal bleed 06/13/2017   From chart...hx of multiple GI bleeds  . GERD (gastroesophageal reflux disease)   . Hyperlipidemia   . Hypertension   . PAD (peripheral artery disease) ([place])   . Renal insufficiency    Per pt, 32 oz fluid restriction per day  . S/P triple vessel bypass 06/09/2016   2009ish  . Suicidal ideation    & HOMICIDAL IDEATION --  06-16-2013   ADMITTED TO BEHAVIOR HEALTH    Patient Active Problem List   Diagnosis Date Noted  . Protein-calorie malnutrition, severe 06/14/2017  . Encounter for dialysis [facility])   . Palliative care by specialist   . Goals of care, counseling/discussion   . Malnutrition of moderate degree 06/05/2017  . Secondary esophageal varices without bleeding ([place])   . Stomach irritation   . Idiopathic esophageal varices without bleeding ([place])   . Alcoholic hepatitis with ascites 05/24/2017  . ESRD (end stage renal disease) ([place]) 04/28/2017  . Uremia 03/08/2017  . ESRD on hemodialysis ([place]) 03/03/2017  .  Weakness 02/28/2017  . Hypocalcemia 02/22/2017  . Shortness of breath 11/26/2016  . COPD (chronic obstructive pulmonary disease) ([place]) 10/30/2016  . COPD exacerbation ([place]) 10/29/2016  . Anemia   . Heme positive stool   . Ulceration of intestine   . Benign neoplasm of transverse colon   . Acute gastrointestinal hemorrhage   . Esophageal candidiasis ([place])   . Angiodysplasia of intestinal tract   . Acute respiratory failure with hypoxia ([place]) 07/03/2016  . GI bleeding 06/24/2016  . Rectal bleeding 06/14/2016  . Anemia of chronic disease 06/01/2016  . MRSA carrier 06/01/2016  . Chronic renal failure 05/23/2016  . Ischemic heart disease 05/23/2016  . Angiodysplasia of small intestine   . Melena   . Small bowel bleed not requiring more than 4 units of blood in 24 hours, ICU, or surgery   . Anemia due to chronic blood loss   . Abdominal pain 05/05/2016  . Acute posthemorrhagic anemia 04/17/2016  . Gastrointestinal bleed 04/17/2016  . History of esophagogastroduodenoscopy (EGD) 04/17/2016  . Elevated troponin 04/17/2016  . Alcohol abuse 04/17/2016  . Upper GI bleed 01/19/2016  . Blood in stool   . Angiodysplasia of stomach and duodenum with hemorrhage   . Gastritis   . Reflux esophagitis   . GI bleed 05/16/2015  . Acute GI bleeding   . Symptomatic anemia 04/30/2015  . HTN (hypertension) 04/06/2015  .  GERD (gastroesophageal reflux disease) 04/06/2015  . HLD (hyperlipidemia) 04/06/2015  . Dyspnea 04/06/2015  . Cirrhosis of liver with ascites ([place]) 04/06/2015  . Ascites 04/06/2015  . GIB (gastrointestinal bleeding) 03/23/2015  . Homicidal ideation 06/19/2013  . Suicidal intent 06/19/2013  . Homicidal ideations 06/19/2013  . Hyperkalemia 06/16/2013  . Mandible fracture ([place]) 06/05/2013  . Fracture, mandible ([place]) 06/02/2013  . Coronary atherosclerosis of native coronary artery 06/02/2013  . ESRD on dialysis ([place]) 06/02/2013  . Mandible open fracture ([place]) 06/02/2013     Past Surgical History:  Procedure Laterality Date  . A/V FISTULAGRAM Right 06/06/2017   Procedure: A/V FISTULAGRAM;  Surgeon: Katha Cabal, MD;  Location: [place] CV LAB;  Service: Cardiovascular;  Laterality: Right;  . A/V SHUNT INTERVENTION N/A 06/06/2017   Procedure: A/V SHUNT INTERVENTION;  Surgeon: Katha Cabal, MD;  Location: [place] CV LAB;  Service: Cardiovascular;  Laterality: N/A;  . AGILE CAPSULE N/A 06/19/2016   Procedure: AGILE CAPSULE;  Surgeon: [name], MD;  Location: [facility] ENDOSCOPY;  Service: Endoscopy;  Laterality: N/A;  . COLONOSCOPY WITH PROPOFOL N/A 06/18/2016   Procedure: COLONOSCOPY WITH PROPOFOL;  Surgeon: [name], MD;  Location: [facility] ENDOSCOPY;  Service: Endoscopy;  Laterality: N/A;  . COLONOSCOPY WITH PROPOFOL N/A 08/12/2016   Procedure: COLONOSCOPY WITH PROPOFOL;  Surgeon: [name], MD;  Location: [facility] ENDOSCOPY;  Service: Endoscopy;  Laterality: N/A;  . COLONOSCOPY WITH PROPOFOL N/A 05/05/2017   Procedure: COLONOSCOPY WITH PROPOFOL;  Surgeon: Manya Silvas, MD;  Location: [facility] ENDOSCOPY;  Service: Endoscopy;  Laterality: N/A;  . CORONARY ANGIOPLASTY  ?   PT UNABLE TO TELL IF  BEFORE OR AFTER  CABG  . CORONARY ARTERY BYPASS GRAFT  2008  ([place] , )   3 VESSEL  . DIALYSIS FISTULA CREATION  LAST SURGERY  APPOX  2008  . ENTEROSCOPY N/A 05/10/2016   Procedure: ENTEROSCOPY;  Surgeon: Jerene Bears, MD;  Location: [place];  Service: Gastroenterology;  Laterality: N/A;  . ENTEROSCOPY N/A 08/12/2016   Procedure: ENTEROSCOPY;  Surgeon: [name], MD;  Location: [facility] ENDOSCOPY;  Service: Endoscopy;  Laterality: N/A;  . ENTEROSCOPY Left 06/03/2017   Procedure: ENTEROSCOPY;  Surgeon: Virgel Manifold, MD;  Location: [facility] ENDOSCOPY;  Service: Endoscopy;  Laterality: Left;  Procedure date will ultimately depend on when patient is medically optimized before the procedure, pending hemodialysis and blood transfusions etc. Will place on  schedule and change depending on clinical status.   . ENTEROSCOPY N/A 06/05/2017   Procedure: ENTEROSCOPY;  Surgeon: Virgel Manifold, MD;  Location: [facility] ENDOSCOPY;  Service: Endoscopy;  Laterality: N/A;  . ENTEROSCOPY N/A 06/15/2017   Procedure: Push ENTEROSCOPY;  Surgeon: [name], MD;  Location: [facility] ENDOSCOPY;  Service: Endoscopy;  Laterality: N/A;  . ESOPHAGOGASTRODUODENOSCOPY N/A 05/07/2015   Procedure: ESOPHAGOGASTRODUODENOSCOPY (EGD);  Surgeon: Hulen Luster, MD;  Location: [facility] ENDOSCOPY;  Service: Endoscopy;  Laterality: N/A;  . ESOPHAGOGASTRODUODENOSCOPY (EGD) WITH PROPOFOL N/A 05/17/2015   Procedure: ESOPHAGOGASTRODUODENOSCOPY (EGD) WITH PROPOFOL;  Surgeon: [name], MD;  Location: [facility] ENDOSCOPY;  Service: Endoscopy;  Laterality: N/A;  . ESOPHAGOGASTRODUODENOSCOPY (EGD) WITH PROPOFOL N/A 01/20/2016   Procedure: ESOPHAGOGASTRODUODENOSCOPY (EGD) WITH PROPOFOL;  Surgeon: [name], MD;  Location: [facility] ENDOSCOPY;  Service: Endoscopy;  Laterality: N/A;  . ESOPHAGOGASTRODUODENOSCOPY (EGD) WITH PROPOFOL N/A 04/17/2016   Procedure: ESOPHAGOGASTRODUODENOSCOPY (EGD) WITH PROPOFOL;  Surgeon: Lin Landsman, MD;  Location: [facility] ENDOSCOPY;  Service: Gastroenterology;  Laterality: N/A;  . ESOPHAGOGASTRODUODENOSCOPY (EGD) WITH PROPOFOL  05/09/2016  Procedure: ESOPHAGOGASTRODUODENOSCOPY (EGD) WITH PROPOFOL;  Surgeon: Jerene Bears, MD;  Location: [place];  Service: Endoscopy;;  . ESOPHAGOGASTRODUODENOSCOPY (EGD) WITH PROPOFOL N/A 06/16/2016   Procedure: ESOPHAGOGASTRODUODENOSCOPY (EGD) WITH PROPOFOL;  Surgeon: [name], MD;  Location: [facility] ENDOSCOPY;  Service: Endoscopy;  Laterality: N/A;  . ESOPHAGOGASTRODUODENOSCOPY (EGD) WITH PROPOFOL N/A 05/05/2017   Procedure: ESOPHAGOGASTRODUODENOSCOPY (EGD) WITH PROPOFOL;  Surgeon: Manya Silvas, MD;  Location: [facility] ENDOSCOPY;  Service: Endoscopy;  Laterality: N/A;  . ESOPHAGOGASTRODUODENOSCOPY (EGD) WITH PROPOFOL N/A 06/15/2017   Procedure:  ESOPHAGOGASTRODUODENOSCOPY (EGD) WITH PROPOFOL;  Surgeon: [name], MD;  Location: [facility] ENDOSCOPY;  Service: Endoscopy;  Laterality: N/A;  . GIVENS CAPSULE STUDY N/A 05/07/2016   Procedure: GIVENS CAPSULE STUDY;  Surgeon: Doran Stabler, MD;  Location: [place];  Service: Endoscopy;  Laterality: N/A;  . MANDIBULAR HARDWARE REMOVAL N/A 07/29/2013   Procedure: REMOVAL OF ARCH BARS;  Surgeon: Theodoro Kos, DO;  Location: [place];  Service: Plastics;  Laterality: N/A;  . ORIF MANDIBULAR FRACTURE N/A 06/05/2013   Procedure: REPAIR OF MANDIBULAR FRACTURE x 2 with maxillo-mandibular fixation ;  Surgeon: Theodoro Kos, DO;  Location: [place];  Service: Plastics;  Laterality: N/A;  . PERIPHERAL ARTERIAL STENT GRAFT Left     Prior to Admission medications   Medication Sig Start Date End Date Taking? Authorizing Provider  albuterol (PROVENTIL HFA;VENTOLIN HFA) 108 (90 Base) MCG/ACT inhaler Inhale 2 puffs into the lungs every 6 (six) hours as needed for wheezing or shortness of breath. 04/21/17   Lisa Roca, MD  alum & mag hydroxide-simeth (MAALOX/MYLANTA) 200-200-20 MG/5ML suspension Take 15 mLs every 4 (four) hours as needed by mouth for indigestion or heartburn. Patient not taking: Reported on 04/19/2017 01/25/17   Demetrios Loll, MD  atorvastatin (LIPITOR) 40 MG tablet Take 1 tablet (40 mg total) by mouth daily. 11/16/16   Vaughan Basta, MD  budesonide-formoterol (SYMBICORT) 160-4.5 MCG/ACT inhaler Inhale 2 puffs into the lungs daily. 11/16/16   Vaughan Basta, MD  calcium acetate (PHOSLO) 667 MG capsule Take 2,001 mg by mouth 3 (three) times daily.    [provider]  ferrous sulfate 325 (65 FE) MG tablet Take 1 tablet by mouth 3 (three) times daily. 01/21/17   [provider]  furosemide (LASIX) 80 MG tablet Take 1 tablet (80 mg total) daily by mouth. 01/25/17   Demetrios Loll, MD  gabapentin (NEURONTIN) 300 MG capsule Take 1 capsule by mouth daily as  directed 08/16/16   [provider]  ipratropium-albuterol (DUONEB) 0.5-2.5 (3) MG/3ML SOLN Take 3 mLs by nebulization every 6 (six) hours as needed. 11/28/16   Loletha Grayer, MD  labetalol (NORMODYNE) 100 MG tablet Take 100 mg by mouth daily. 03/24/17   [provider]  Multiple Vitamins-Minerals-FA (DIALYVITE SUPREME D) 3 MG TABS Take 1 tablet (3 mg total) by mouth daily. 11/28/16   Loletha Grayer, MD  nitroGLYCERIN (NITROSTAT) 0.4 MG SL tablet Place 1 tablet (0.4 mg total) under the tongue every 5 (five) minutes as needed. 04/12/16   Wende Bushy, MD  pantoprazole (PROTONIX) 40 MG tablet Take 1 tablet (40 mg total) by mouth daily. 11/28/16   Loletha Grayer, MD  Spacer/Aero Chamber Mouthpiece MISC 1 Units by Does not apply route every 4 (four) hours as needed (wheezing). 02/19/17   Darel Hong, MD  tiotropium (SPIRIVA HANDIHALER) 18 MCG inhalation capsule Place 1 capsule (18 mcg total) into inhaler and inhale daily. 11/16/16 11/16/17  Vaughan Basta, MD    Allergies Patient has  no known allergies.  Family History  Problem Relation Age of Onset  . Colon cancer Mother   . Cancer Father   . Cancer Sister   . Kidney disease Brother     Social History Social History   Tobacco Use  . Smoking status: Current Every Day Smoker    Packs/day: 0.15    Years: 40.00    Pack years: 6.00    Types: Cigarettes  . Smokeless tobacco: Never Used  Substance Use Topics  . Alcohol use: No    Comment: pt reports quitting after learning about cirrhosis  . Drug use: No    Frequency: 7.0 times per week    Types: Marijuana, Cocaine    Review of Systems  Constitutional: No fever/chills Eyes: No visual changes. ENT: No sore throat. Cardiovascular: Denies chest pain. Respiratory: Denies shortness of breath. Gastrointestinal: No abdominal pain.  No nausea, no vomiting.  No diarrhea.  No constipation. Genitourinary: Negative for dysuria. Musculoskeletal: Negative for back  pain. Skin: Negative for rash. Neurological: Negative for headaches, focal weakness  ____________________________________________   PHYSICAL EXAM:  VITAL SIGNS: ED Triage Vitals  Enc Vitals Group     BP 07/03/17 1430 137/81     Pulse Rate 07/03/17 1430 80     Resp 07/03/17 1430 18     Temp 07/03/17 1430 97.8 F (36.6 C)     Temp Source 07/03/17 1430 Oral     SpO2 07/03/17 1430 100 %     Weight 07/03/17 1431 159 lb (72.1 kg)     Height 07/03/17 1431 6\' 3"  (1.905 m)     Head Circumference --      Peak Flow --      Pain Score 07/03/17 1431 0     Pain Loc --      Pain Edu? --      Excl. in [place]? --     Constitutional: Alert and oriented. Well appearing and in no acute distress. Eyes: Conjunctivae are normal.  Head: Atraumatic. Nose: No congestion/rhinnorhea. Mouth/Throat: Mucous membranes are moist.  Oropharynx non-erythematous. Neck: No stridor.   Cardiovascular: Normal rate, regular rhythm. Grossly normal heart sounds.  Good peripheral circulation. Respiratory: Normal respiratory effort.  No retractions. Lungs CTAB. Gastrointestinal: Soft and nontender.  Minimal distention. No abdominal bruits. No CVA tenderness. Musculoskeletal: No lower extremity tenderness nor edema.  No joint effusions. Neurologic:  Normal speech and language. No gross focal neurologic deficits are appreciated. No gait instability. Skin:  Skin is warm, dry and intact. No rash noted. Psychiatric: Mood and affect are normal. Speech and behavior are normal.  ____________________________________________   LABS (all labs ordered are listed, but only abnormal results are displayed)  Labs Reviewed - No data to display ____________________________________________  EKG   ____________________________________________  RADIOLOGY  ED MD interpretation:    Official radiology report(s): No results found.  ____________________________________________   PROCEDURES  Procedure(s) performed:    Procedures  Critical Care performed:   ____________________________________________   INITIAL IMPRESSION / ASSESSMENT AND PLAN / ED COURSE           ____________________________________________   FINAL CLINICAL IMPRESSION(S) / ED DIAGNOSES  Final diagnoses:  Dialysis patient [facility])     ED Discharge Orders    None       Note:  This document was prepared using Dragon voice recognition software and may include unintentional dictation errors.    Nena Polio, MD 07/03/17 [phone]

## 2017-07-04 NOTE — ED Notes (Signed)
Call from Dialysis, pt was "tired of waiting" and left unit and went home.

## 2017-07-07 ENCOUNTER — Other Ambulatory Visit: Payer: Self-pay

## 2017-07-07 ENCOUNTER — Emergency Department
Admission: EM | Admit: 2017-07-07 | Discharge: 2017-07-07 | Disposition: A | Discharge status: Home / Self Care | Payer: Medicare Other | Attending: Emergency Medicine | Admitting: Emergency Medicine

## 2017-07-07 ENCOUNTER — Encounter: Payer: Self-pay | Admitting: Emergency Medicine

## 2017-07-07 DIAGNOSIS — Z992 Dependence on renal dialysis: Secondary | ICD-10-CM | POA: Insufficient documentation

## 2017-07-07 DIAGNOSIS — Z5321 Procedure and treatment not carried out due to patient leaving prior to being seen by health care provider: Secondary | ICD-10-CM | POA: Diagnosis not present

## 2017-07-07 IMAGING — US US PARACENTESIS
1 series · 12 of 12 positions shown · non-contrast
Comparison: none

CLINICAL DATA: Cirrhosis, recurrent ascites, abdominal distension

EXAM:
ULTRASOUND GUIDED PARACENTESIS
TECHNIQUE: The procedure, risks (including but not limited to bleeding,
infection, organ damage ), benefits, and alternatives were explained
to the patient. Questions regarding the procedure were encouraged
and answered. The patient understands and consents to the procedure.
Survey ultrasound of the abdomen was performed and an appropriate
skin entry site in the right lateral abdomen was selected. Skin site
was marked, prepped with Betadine, and draped in usual sterile
fashion, and infiltrated locally with 1% lidocaine. A
Safe-T-Centesis sheath needle was advanced into the peritoneal space
until fluid could be aspirated. The sheath was advanced and the
needle removed. 5 L of amber ascites were aspirated.
COMPLICATIONS:
COMPLICATIONS
none

[Series 1: us paracentesis · 0.31mm/px · 12 of 12 slices shown]
[im 1/12]
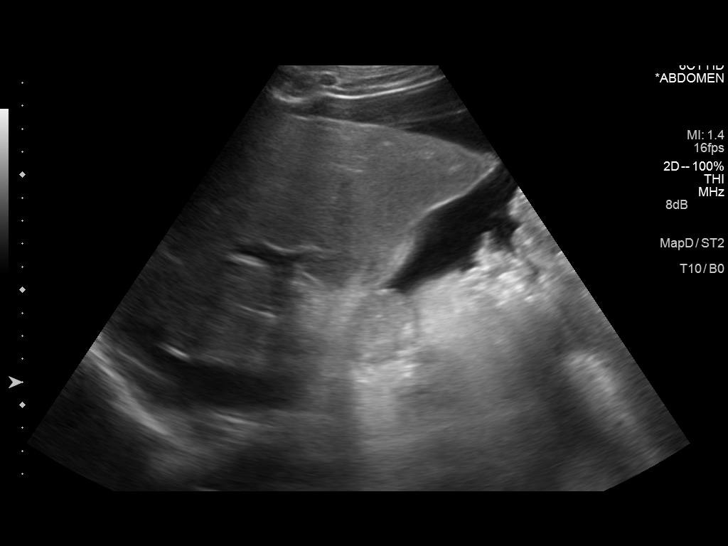
[im 2/12]
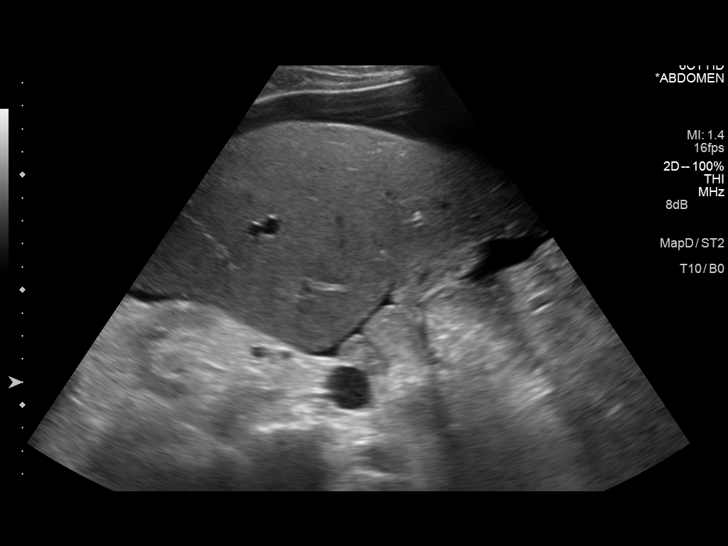
[im 3/12]
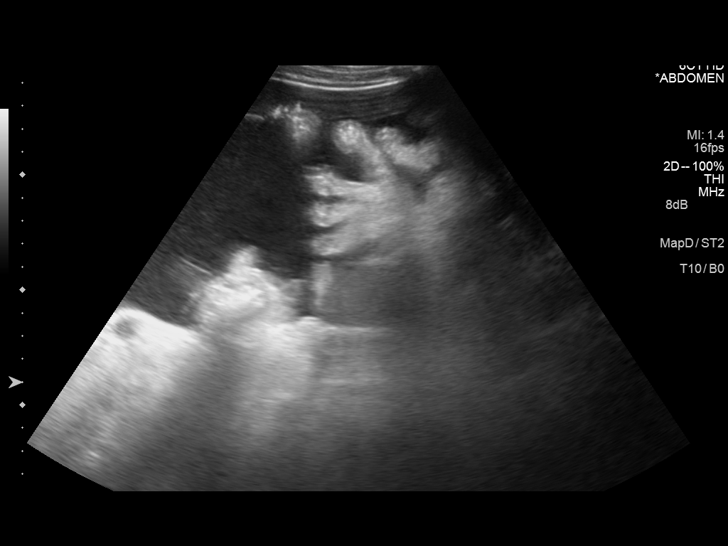
[im 4/12]
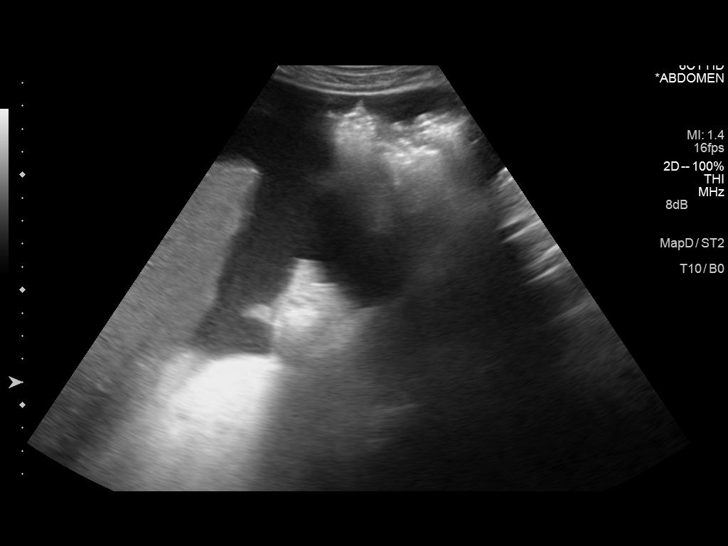
[im 5/12]
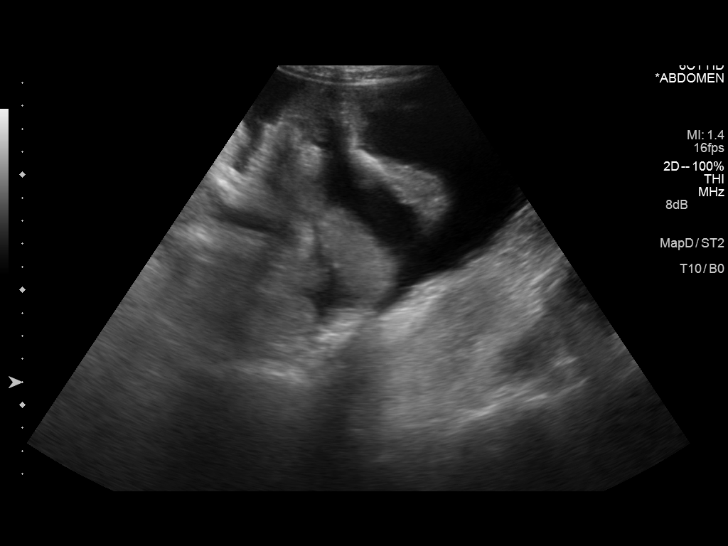
[im 6/12]
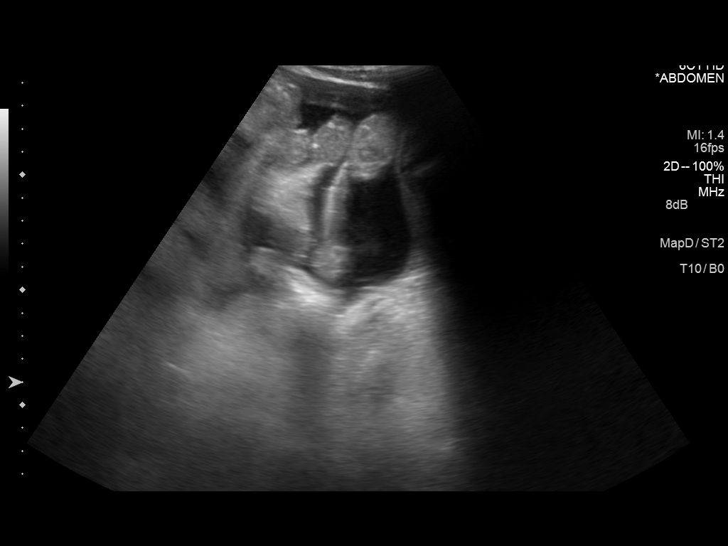
[im 7/12]
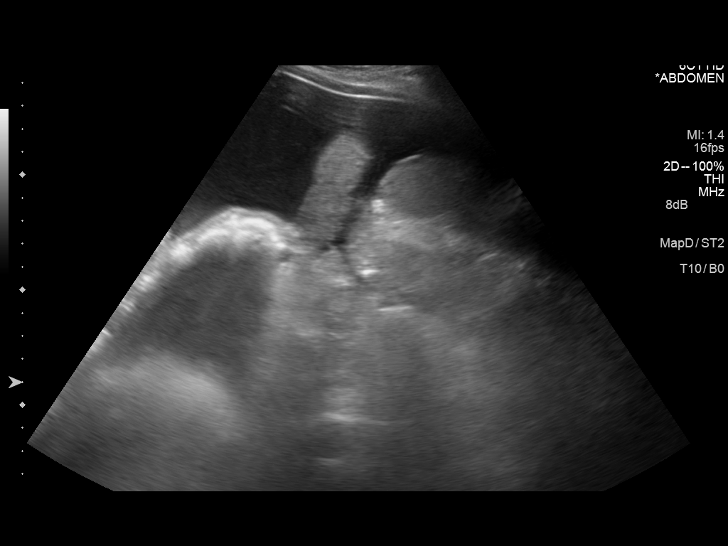
[im 8/12]
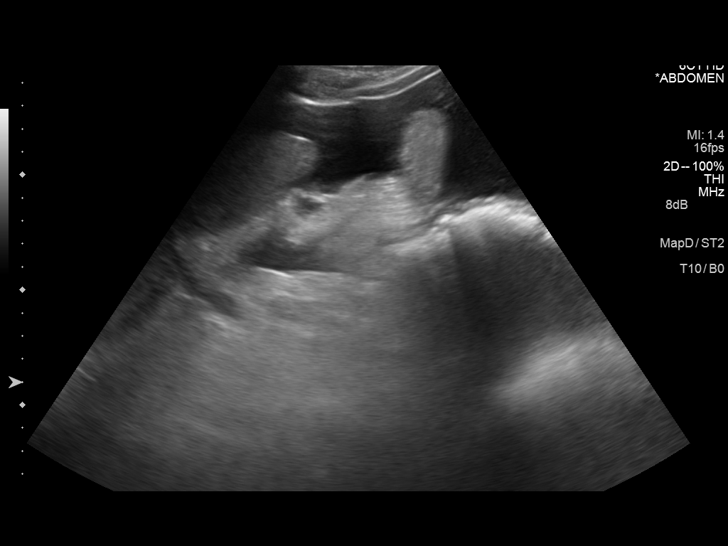
[im 9/12]
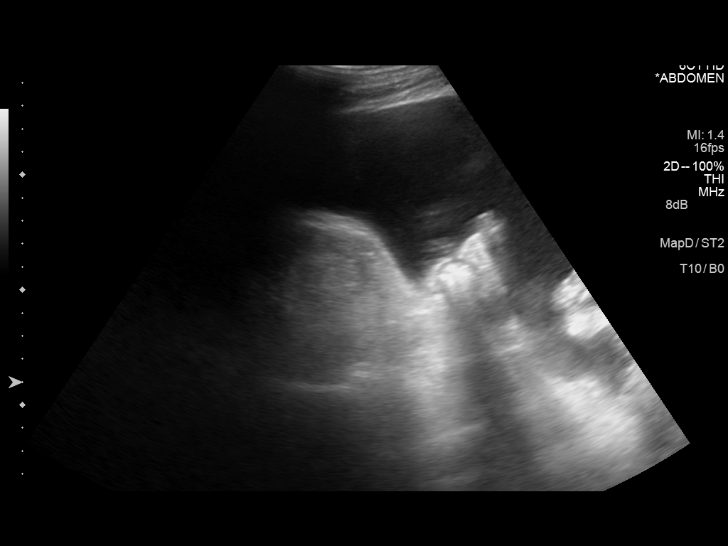
[im 10/12]
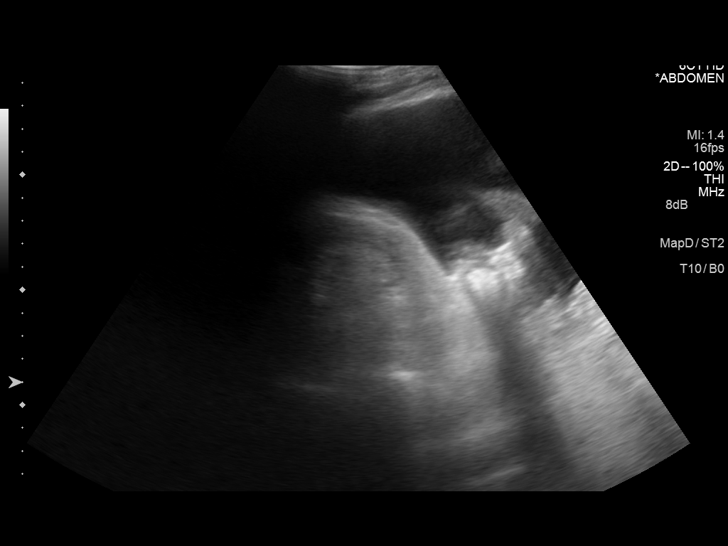
[im 11/12]
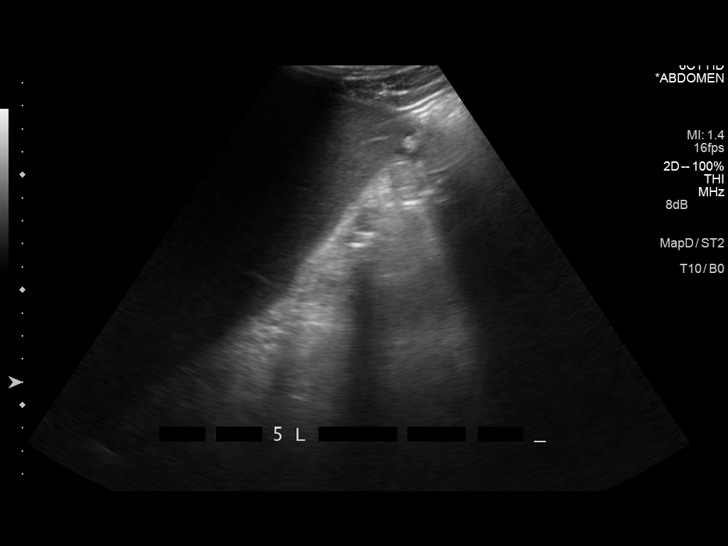
[im 12/12]
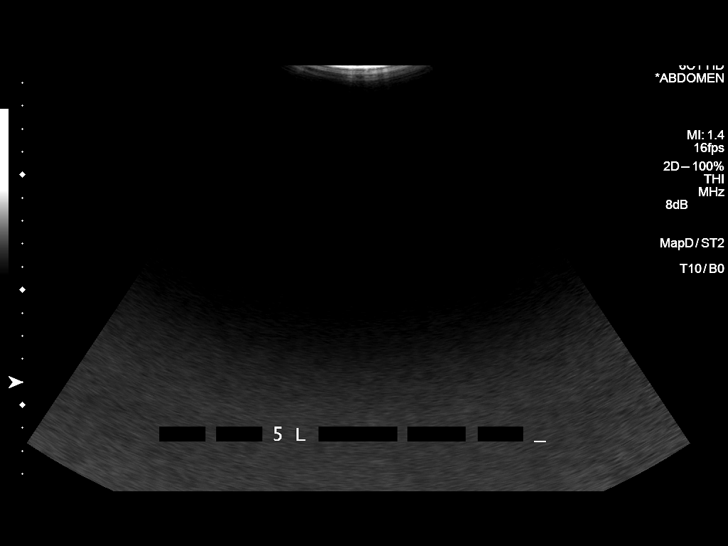

[12 of 12 positions shown; findings below may reference images not displayed]

IMPRESSION: Technically successful ultrasound guided paracentesis, removing 5 L
of ascites.

## 2017-07-07 NOTE — ED Triage Notes (Signed)
Arrives for routine dialysis.  AAOx2.  Skin warm and dry. NAD.  Denies all complaint.

## 2017-07-10 ENCOUNTER — Ambulatory Visit
Admission: EM | Admit: 2017-07-10 | Discharge: 2017-07-10 | Disposition: A | Discharge status: Home / Self Care | Payer: Medicare Other | Attending: Emergency Medicine | Admitting: Emergency Medicine

## 2017-07-10 ENCOUNTER — Other Ambulatory Visit: Payer: Self-pay

## 2017-07-10 ENCOUNTER — Ambulatory Visit: Payer: Medicare Other

## 2017-07-10 ENCOUNTER — Encounter: Payer: Self-pay | Admitting: Emergency Medicine

## 2017-07-10 DIAGNOSIS — E785 Hyperlipidemia, unspecified: Secondary | ICD-10-CM | POA: Diagnosis not present

## 2017-07-10 DIAGNOSIS — K219 Gastro-esophageal reflux disease without esophagitis: Secondary | ICD-10-CM | POA: Insufficient documentation

## 2017-07-10 DIAGNOSIS — R0602 Shortness of breath: Secondary | ICD-10-CM | POA: Diagnosis not present

## 2017-07-10 DIAGNOSIS — I509 Heart failure, unspecified: Secondary | ICD-10-CM | POA: Diagnosis not present

## 2017-07-10 DIAGNOSIS — Z79899 Other long term (current) drug therapy: Secondary | ICD-10-CM | POA: Insufficient documentation

## 2017-07-10 DIAGNOSIS — Z951 Presence of aortocoronary bypass graft: Secondary | ICD-10-CM | POA: Diagnosis not present

## 2017-07-10 DIAGNOSIS — I2510 Atherosclerotic heart disease of native coronary artery without angina pectoris: Secondary | ICD-10-CM | POA: Diagnosis not present

## 2017-07-10 DIAGNOSIS — E875 Hyperkalemia: Secondary | ICD-10-CM | POA: Diagnosis not present

## 2017-07-10 DIAGNOSIS — I132 Hypertensive heart and chronic kidney disease with heart failure and with stage 5 chronic kidney disease, or end stage renal disease: Secondary | ICD-10-CM | POA: Diagnosis not present

## 2017-07-10 DIAGNOSIS — N186 End stage renal disease: Secondary | ICD-10-CM | POA: Diagnosis not present

## 2017-07-10 DIAGNOSIS — Z4931 Encounter for adequacy testing for hemodialysis: Secondary | ICD-10-CM | POA: Diagnosis not present

## 2017-07-10 DIAGNOSIS — K7460 Unspecified cirrhosis of liver: Secondary | ICD-10-CM | POA: Diagnosis not present

## 2017-07-10 DIAGNOSIS — I700 Atherosclerosis of aorta: Secondary | ICD-10-CM | POA: Insufficient documentation

## 2017-07-10 DIAGNOSIS — Z992 Dependence on renal dialysis: Secondary | ICD-10-CM

## 2017-07-10 DIAGNOSIS — J189 Pneumonia, unspecified organism: Secondary | ICD-10-CM

## 2017-07-10 DIAGNOSIS — R188 Other ascites: Secondary | ICD-10-CM | POA: Insufficient documentation

## 2017-07-10 DIAGNOSIS — F17210 Nicotine dependence, cigarettes, uncomplicated: Secondary | ICD-10-CM | POA: Insufficient documentation

## 2017-07-10 DIAGNOSIS — R05 Cough: Secondary | ICD-10-CM | POA: Diagnosis present

## 2017-07-10 DIAGNOSIS — D631 Anemia in chronic kidney disease: Secondary | ICD-10-CM | POA: Diagnosis not present

## 2017-07-10 DIAGNOSIS — J811 Chronic pulmonary edema: Secondary | ICD-10-CM | POA: Diagnosis not present

## 2017-07-10 LAB — RENAL FUNCTION PANEL
Albumin: 2.3 g/dL — ABNORMAL LOW (ref 3.5–5.0)
Anion gap: 11 (ref 5–15)
BUN: 55 mg/dL — AB (ref 6–20)
CHLORIDE: 93 mmol/L — AB (ref 101–111)
CO2: 32 mmol/L (ref 22–32)
CREATININE: 12.38 mg/dL — AB (ref 0.61–1.24)
Calcium: 8.5 mg/dL — ABNORMAL LOW (ref 8.9–10.3)
GFR calc Af Amer: 4 mL/min — ABNORMAL LOW (ref >60)
GFR calc non Af Amer: 4 mL/min — ABNORMAL LOW (ref >60)
Glucose, Bld: 111 mg/dL — ABNORMAL HIGH (ref 65–99)
Phosphorus: 4.9 mg/dL — ABNORMAL HIGH (ref 2.5–4.6)
Potassium: 7.2 mmol/L (ref 3.5–5.1)
SODIUM: 136 mmol/L (ref 135–145)

## 2017-07-10 LAB — POTASSIUM: Potassium: 4 mmol/L (ref 3.5–5.1)

## 2017-07-10 MED ORDER — IPRATROPIUM-ALBUTEROL 0.5-2.5 (3) MG/3ML IN SOLN
3.0000 mL | Freq: Four times a day (QID) | RESPIRATORY_TRACT | Status: DC
  Administered 2017-07-10: 3 mL via RESPIRATORY_TRACT

## 2017-07-10 MED ORDER — DOXYCYCLINE HYCLATE 100 MG PO TABS: 100.0000 mg | ORAL_TABLET | Freq: Two times a day (BID) | ORAL | 0 refills | Status: DC

## 2017-07-10 MED ORDER — EPOETIN ALFA 10000 UNIT/ML IJ SOLN
10000.0000 [IU] | Freq: Once | INTRAMUSCULAR | Status: AC
  Administered 2017-07-10: 10000 [IU] via INTRAVENOUS
  Filled 2017-07-10 (×2): qty 1

## 2017-07-10 NOTE — ED Notes (Signed)
Pt sent back to floor for cxr due to temp and cough - Dr Kerman Passey is aware

## 2017-07-10 NOTE — Progress Notes (Signed)
RT called (Amy) informed of order for Duoneb due to shortness of breath.

## 2017-07-10 NOTE — Progress Notes (Signed)
HD tx end   07/10/17 1620  Vital Signs  Pulse Rate 97  Pulse Rate Source Monitor  Resp (!) 21  BP (!) 122/91  BP Location Left Arm  BP Method Automatic  Patient Position (if appropriate) Lying  During Hemodialysis Assessment  Dialysis Fluid Bolus Normal Saline  Bolus Amount (mL) 250 mL  Intra-Hemodialysis Comments Tx completed

## 2017-07-10 NOTE — Progress Notes (Signed)
Post HD assessment    07/10/17 1630  Neurological  Level of Consciousness Alert  Orientation Level Oriented X4  Respiratory  Respiratory Pattern Regular;Unlabored  Chest Assessment Chest expansion symmetrical  Cardiac  ECG Monitor Yes  Vascular  R Radial Pulse +2  L Radial Pulse +2  Integumentary  Integumentary (WDL) X  Skin Color Appropriate for ethnicity  Musculoskeletal  Musculoskeletal (WDL) X  Generalized Weakness Yes  Assistive Device None  GU Assessment  Genitourinary (WDL) X  Genitourinary Symptoms  (HD)  Psychosocial  Psychosocial (WDL) WDL

## 2017-07-10 NOTE — ED Provider Notes (Signed)
[facility] Emergency Department Provider Note   ____________________________________________    I have reviewed the triage vital signs and the nursing notes.   HISTORY  Chief Complaint dialysis     HPI Stanley [name] is a 57 y.o. male is a patient who comes to the emergency department for dialysis 3 times a week.  He is in his baseline state of health, no shortness of breath.  Denies fevers or chills or extremity swelling   Past Medical History:  Diagnosis Date  . Alcohol abuse   . CHF (congestive heart failure) ([place])   . Cirrhosis ([place])   . Coronary artery disease 2009  . Drug abuse ([place])   . End stage renal disease on dialysis [facility]) NEPHROLOGIST-   DR [facility]  IN [place]   HEMODIALYSIS --   TUES/  THURS/  SAT  . Gastrointestinal bleed 06/13/2017   From chart...hx of multiple GI bleeds  . GERD (gastroesophageal reflux disease)   . Hyperlipidemia   . Hypertension   . PAD (peripheral artery disease) ([place])   . Renal insufficiency    Per pt, 32 oz fluid restriction per day  . S/P triple vessel bypass 06/09/2016   2009ish  . Suicidal ideation    & HOMICIDAL IDEATION --  06-16-2013   ADMITTED TO BEHAVIOR HEALTH    Patient Active Problem List   Diagnosis Date Noted  . Protein-calorie malnutrition, severe 06/14/2017  . Encounter for dialysis [facility])   . Palliative care by specialist   . Goals of care, counseling/discussion   . Malnutrition of moderate degree 06/05/2017  . Secondary esophageal varices without bleeding ([place])   . Stomach irritation   . Idiopathic esophageal varices without bleeding ([place])   . Alcoholic hepatitis with ascites 05/24/2017  . ESRD (end stage renal disease) ([place]) 04/28/2017  . Uremia 03/08/2017  . ESRD on hemodialysis ([place]) 03/03/2017  . Weakness 02/28/2017  . Hypocalcemia 02/22/2017  . Shortness of breath 11/26/2016  . COPD (chronic obstructive pulmonary disease) ([place]) 10/30/2016  . COPD exacerbation  ([place]) 10/29/2016  . Anemia   . Heme positive stool   . Ulceration of intestine   . Benign neoplasm of transverse colon   . Acute gastrointestinal hemorrhage   . Esophageal candidiasis ([place])   . Angiodysplasia of intestinal tract   . Acute respiratory failure with hypoxia ([place]) 07/03/2016  . GI bleeding 06/24/2016  . Rectal bleeding 06/14/2016  . Anemia of chronic disease 06/01/2016  . MRSA carrier 06/01/2016  . Chronic renal failure 05/23/2016  . Ischemic heart disease 05/23/2016  . Angiodysplasia of small intestine   . Melena   . Small bowel bleed not requiring more than 4 units of blood in 24 hours, ICU, or surgery   . Anemia due to chronic blood loss   . Abdominal pain 05/05/2016  . Acute posthemorrhagic anemia 04/17/2016  . Gastrointestinal bleed 04/17/2016  . History of esophagogastroduodenoscopy (EGD) 04/17/2016  . Elevated troponin 04/17/2016  . Alcohol abuse 04/17/2016  . Upper GI bleed 01/19/2016  . Blood in stool   . Angiodysplasia of stomach and duodenum with hemorrhage   . Gastritis   . Reflux esophagitis   . GI bleed 05/16/2015  . Acute GI bleeding   . Symptomatic anemia 04/30/2015  . HTN (hypertension) 04/06/2015  . GERD (gastroesophageal reflux disease) 04/06/2015  . HLD (hyperlipidemia) 04/06/2015  . Dyspnea 04/06/2015  . Cirrhosis of liver with ascites ([place]) 04/06/2015  . Ascites 04/06/2015  . GIB (gastrointestinal bleeding)  03/23/2015  . Homicidal ideation 06/19/2013  . Suicidal intent 06/19/2013  . Homicidal ideations 06/19/2013  . Hyperkalemia 06/16/2013  . Mandible fracture ([place]) 06/05/2013  . Fracture, mandible ([place]) 06/02/2013  . Coronary atherosclerosis of native coronary artery 06/02/2013  . ESRD on dialysis ([place]) 06/02/2013  . Mandible open fracture ([place]) 06/02/2013    Past Surgical History:  Procedure Laterality Date  . A/V FISTULAGRAM Right 06/06/2017   Procedure: A/V FISTULAGRAM;  Surgeon: Katha Cabal, MD;  Location: [place] [facility];  Service: Cardiovascular;  Laterality: Right;  . A/V SHUNT INTERVENTION N/A 06/06/2017   Procedure: A/V SHUNT INTERVENTION;  Surgeon: Katha Cabal, MD;  Location: [place] [facility];  Service: Cardiovascular;  Laterality: N/A;  . AGILE CAPSULE N/A 06/19/2016   Procedure: AGILE CAPSULE;  Surgeon: [name], MD;  Location: [facility] ENDOSCOPY;  Service: Endoscopy;  Laterality: N/A;  . COLONOSCOPY WITH PROPOFOL N/A 06/18/2016   Procedure: COLONOSCOPY WITH PROPOFOL;  Surgeon: [name], MD;  Location: [facility] ENDOSCOPY;  Service: Endoscopy;  Laterality: N/A;  . COLONOSCOPY WITH PROPOFOL N/A 08/12/2016   Procedure: COLONOSCOPY WITH PROPOFOL;  Surgeon: [name], MD;  Location: [facility] ENDOSCOPY;  Service: Endoscopy;  Laterality: N/A;  . COLONOSCOPY WITH PROPOFOL N/A 05/05/2017   Procedure: COLONOSCOPY WITH PROPOFOL;  Surgeon: Manya Silvas, MD;  Location: [facility] ENDOSCOPY;  Service: Endoscopy;  Laterality: N/A;  . CORONARY ANGIOPLASTY  ?   PT UNABLE TO TELL IF  BEFORE OR AFTER  CABG  . CORONARY ARTERY BYPASS GRAFT  2008  ([place]   3 VESSEL  . DIALYSIS FISTULA CREATION  LAST SURGERY  APPOX  2008  . ENTEROSCOPY N/A 05/10/2016   Procedure: ENTEROSCOPY;  Surgeon: Jerene Bears, MD;  Location: [place];  Service: Gastroenterology;  Laterality: N/A;  . ENTEROSCOPY N/A 08/12/2016   Procedure: ENTEROSCOPY;  Surgeon: [name], MD;  Location: [facility] ENDOSCOPY;  Service: Endoscopy;  Laterality: N/A;  . ENTEROSCOPY Left 06/03/2017   Procedure: ENTEROSCOPY;  Surgeon: Virgel Manifold, MD;  Location: [facility] ENDOSCOPY;  Service: Endoscopy;  Laterality: Left;  Procedure date will ultimately depend on when patient is medically optimized before the procedure, pending hemodialysis and blood transfusions etc. Will place on schedule and change depending on clinical status.   . ENTEROSCOPY N/A 06/05/2017   Procedure: ENTEROSCOPY;  Surgeon: Virgel Manifold, MD;  Location: [facility] ENDOSCOPY;   Service: Endoscopy;  Laterality: N/A;  . ENTEROSCOPY N/A 06/15/2017   Procedure: Push ENTEROSCOPY;  Surgeon: [name], MD;  Location: [facility] ENDOSCOPY;  Service: Endoscopy;  Laterality: N/A;  . ESOPHAGOGASTRODUODENOSCOPY N/A 05/07/2015   Procedure: ESOPHAGOGASTRODUODENOSCOPY (EGD);  Surgeon: Hulen Luster, MD;  Location: [facility] ENDOSCOPY;  Service: Endoscopy;  Laterality: N/A;  . ESOPHAGOGASTRODUODENOSCOPY (EGD) WITH PROPOFOL N/A 05/17/2015   Procedure: ESOPHAGOGASTRODUODENOSCOPY (EGD) WITH PROPOFOL;  Surgeon: [name], MD;  Location: [facility] ENDOSCOPY;  Service: Endoscopy;  Laterality: N/A;  . ESOPHAGOGASTRODUODENOSCOPY (EGD) WITH PROPOFOL N/A 01/20/2016   Procedure: ESOPHAGOGASTRODUODENOSCOPY (EGD) WITH PROPOFOL;  Surgeon: [name], MD;  Location: [facility] ENDOSCOPY;  Service: Endoscopy;  Laterality: N/A;  . ESOPHAGOGASTRODUODENOSCOPY (EGD) WITH PROPOFOL N/A 04/17/2016   Procedure: ESOPHAGOGASTRODUODENOSCOPY (EGD) WITH PROPOFOL;  Surgeon: Lin Landsman, MD;  Location: [facility] ENDOSCOPY;  Service: Gastroenterology;  Laterality: N/A;  . ESOPHAGOGASTRODUODENOSCOPY (EGD) WITH PROPOFOL  05/09/2016   Procedure: ESOPHAGOGASTRODUODENOSCOPY (EGD) WITH PROPOFOL;  Surgeon: Jerene Bears, MD;  Location: [facility] ENDOSCOPY;  Service: Endoscopy;;  . ESOPHAGOGASTRODUODENOSCOPY (EGD) WITH PROPOFOL N/A 06/16/2016   Procedure: ESOPHAGOGASTRODUODENOSCOPY (EGD) WITH  PROPOFOL;  Surgeon: [name], MD;  Location: [facility] ENDOSCOPY;  Service: Endoscopy;  Laterality: N/A;  . ESOPHAGOGASTRODUODENOSCOPY (EGD) WITH PROPOFOL N/A 05/05/2017   Procedure: ESOPHAGOGASTRODUODENOSCOPY (EGD) WITH PROPOFOL;  Surgeon: Manya Silvas, MD;  Location: [facility] ENDOSCOPY;  Service: Endoscopy;  Laterality: N/A;  . ESOPHAGOGASTRODUODENOSCOPY (EGD) WITH PROPOFOL N/A 06/15/2017   Procedure: ESOPHAGOGASTRODUODENOSCOPY (EGD) WITH PROPOFOL;  Surgeon: [name], MD;  Location: [facility] ENDOSCOPY;  Service: Endoscopy;  Laterality: N/A;  . GIVENS CAPSULE STUDY N/A  05/07/2016   Procedure: GIVENS CAPSULE STUDY;  Surgeon: Doran Stabler, MD;  Location: [place];  Service: Endoscopy;  Laterality: N/A;  . MANDIBULAR HARDWARE REMOVAL N/A 07/29/2013   Procedure: REMOVAL OF ARCH BARS;  Surgeon: Theodoro Kos, DO;  Location: [place];  Service: Plastics;  Laterality: N/A;  . ORIF MANDIBULAR FRACTURE N/A 06/05/2013   Procedure: REPAIR OF MANDIBULAR FRACTURE x 2 with maxillo-mandibular fixation ;  Surgeon: Theodoro Kos, DO;  Location: [place];  Service: Plastics;  Laterality: N/A;  . PERIPHERAL ARTERIAL STENT GRAFT Left     Prior to Admission medications   Medication Sig Start Date End Date Taking? Authorizing Provider  albuterol (PROVENTIL HFA;VENTOLIN HFA) 108 (90 Base) MCG/ACT inhaler Inhale 2 puffs into the lungs every 6 (six) hours as needed for wheezing or shortness of breath. 04/21/17   Lisa Roca, MD  alum & mag hydroxide-simeth (MAALOX/MYLANTA) 200-200-20 MG/5ML suspension Take 15 mLs every 4 (four) hours as needed by mouth for indigestion or heartburn. Patient not taking: Reported on 04/19/2017 01/25/17   Demetrios Loll, MD  atorvastatin (LIPITOR) 40 MG tablet Take 1 tablet (40 mg total) by mouth daily. 11/16/16   Vaughan Basta, MD  budesonide-formoterol (SYMBICORT) 160-4.5 MCG/ACT inhaler Inhale 2 puffs into the lungs daily. 11/16/16   Vaughan Basta, MD  calcium acetate (PHOSLO) 667 MG capsule Take 2,001 mg by mouth 3 (three) times daily.    [provider]  ferrous sulfate 325 (65 FE) MG tablet Take 1 tablet by mouth 3 (three) times daily. 01/21/17   [provider]  furosemide (LASIX) 80 MG tablet Take 1 tablet (80 mg total) daily by mouth. 01/25/17   Demetrios Loll, MD  gabapentin (NEURONTIN) 300 MG capsule Take 1 capsule by mouth daily as directed 08/16/16   [provider]  ipratropium-albuterol (DUONEB) 0.5-2.5 (3) MG/3ML SOLN Take 3 mLs by nebulization every 6 (six) hours as needed. 11/28/16    Loletha Grayer, MD  labetalol (NORMODYNE) 100 MG tablet Take 100 mg by mouth daily. 03/24/17   [provider]  Multiple Vitamins-Minerals-FA (DIALYVITE SUPREME D) 3 MG TABS Take 1 tablet (3 mg total) by mouth daily. 11/28/16   Loletha Grayer, MD  nitroGLYCERIN (NITROSTAT) 0.4 MG SL tablet Place 1 tablet (0.4 mg total) under the tongue every 5 (five) minutes as needed. 04/12/16   Wende Bushy, MD  pantoprazole (PROTONIX) 40 MG tablet Take 1 tablet (40 mg total) by mouth daily. 11/28/16   Loletha Grayer, MD  Spacer/Aero Chamber Mouthpiece MISC 1 Units by Does not apply route every 4 (four) hours as needed (wheezing). 02/19/17   Darel Hong, MD  tiotropium (SPIRIVA HANDIHALER) 18 MCG inhalation capsule Place 1 capsule (18 mcg total) into inhaler and inhale daily. 11/16/16 11/16/17  Vaughan Basta, MD     Allergies Patient has no known allergies.  Family History  Problem Relation Age of Onset  . Colon cancer Mother   . Cancer Father   . Cancer Sister   . Kidney  disease Brother     Social History Social History   Tobacco Use  . Smoking status: Current Every Day Smoker    Packs/day: 0.15    Years: 40.00    Pack years: 6.00    Types: Cigarettes  . Smokeless tobacco: Never Used  Substance Use Topics  . Alcohol use: No    Comment: pt reports quitting after learning about cirrhosis  . Drug use: No    Frequency: 7.0 times per week    Types: Marijuana, Cocaine    Review of Systems  Constitutional: No fever/chills  Cardiovascular: Denies chest pain. Respiratory: Denies shortness of breath. Gastrointestinal: No abdominal pain     ____________________________________________   PHYSICAL EXAM:  VITAL SIGNS: ED Triage Vitals  Enc Vitals Group     BP 07/10/17 1141 (!) 130/95     Pulse Rate 07/10/17 1141 82     Resp 07/10/17 1141 18     Temp 07/10/17 1141 97.9 F (36.6 C)     Temp Source 07/10/17 1141 Oral     SpO2 07/10/17 1141 100 %     Weight 07/10/17  1142 72.1 kg (159 lb)     Height 07/10/17 1142 1.905 m (6\' 3" )     Head Circumference --      Peak Flow --      Pain Score 07/10/17 1142 8     Pain Loc --      Pain Edu? --      Excl. in [place]? --     Constitutional: Alert and oriented. No acute distress.  Eyes: Conjunctivae are normal.  Head: Atraumatic.  Mouth/Throat: Mucous membranes are moist.    Cardiovascular: Normal rate, regular rhythm.  Good peripheral circulation. Respiratory: Normal respiratory effort.  No retractions.   Musculoskeletal: No lower extremity tenderness nor edema.  Warm and well perfused Neurologic:  Normal speech and language. No gross focal neurologic deficits are appreciated.  Skin:  Skin is warm, dry and intact. No rash noted. Psychiatric: Mood and affect are normal. Speech and behavior are normal.  ____________________________________________   LABS (all labs ordered are listed, but only abnormal results are displayed)  Labs Reviewed  RENAL FUNCTION PANEL   ____________________________________________  EKG  None ____________________________________________  RADIOLOGY   ____________________________________________   PROCEDURES  Procedure(s) performed: No  Procedures   Critical Care performed: No ____________________________________________   INITIAL IMPRESSION / ASSESSMENT AND PLAN / ED COURSE  Pertinent labs & imaging results that were available during my care of the patient were reviewed by me and considered in my medical decision making (see chart for details).  Patient to routine dialysis.    ____________________________________________   FINAL CLINICAL IMPRESSION(S) / ED DIAGNOSES  Final diagnoses:  Encounter for dialysis [facility])        Note:  This document was prepared using Dragon voice recognition software and may include unintentional dictation errors.    Lavonia Drafts, MD 07/10/17 1154

## 2017-07-10 NOTE — ED Provider Notes (Signed)
-----------------------------------------   4:51 PM on 07/10/2017 -----------------------------------------  Patient has returned from dialysis.  Per dialysis/nephrology the patient had a cough and had a low-grade temperature 99.7 upon discharge from the dialysis center.  Here the patient appears well, no complaints.  Repeat potassium after dialysis is down to 4.0.  We will obtain a chest x-ray as a precaution.  Patient agreeable to this plan.  Chest x-ray shows bilateral opacities largely unchanged from prior.  Could be edema versus pneumonia.  Given the patient's low-grade temperature we will cover with antibiotics.  Patient agreeable to this plan, states he is ready to go home.  I discussed return precautions for fever 101 or higher development of chest pain or any difficulty breathing.   Harvest Dark, MD 07/10/17 1737

## 2017-07-10 NOTE — Progress Notes (Signed)
Post HD assessment. Pt tolerated tx well, without c/o. Pt experienced a 2 degree spike in temperature, 97.3 F (pre HD) to 99.3 F (post HD), MD aware. MD ordered chest X-ray, ED RN made aware. Net UF 3753, goal met.    07/10/17 1626  Vital Signs  Temp 99.3 F (37.4 C)  Temp Source Oral  Pulse Rate 99  Pulse Rate Source Monitor  Resp (!) 24  BP (!) 139/97  BP Location Left Arm  BP Method Automatic  Patient Position (if appropriate) Lying  Post-Hemodialysis Assessment  Rinseback Volume (mL) 250 mL  KECN 92.5 V  Dialyzer Clearance Lightly streaked  Duration of HD Treatment -hour(s) 4 hour(s)  Hemodialysis Intake (mL) 500 mL  UF Total -Machine (mL) 4253 mL  Net UF (mL) 3753 mL  Tolerated HD Treatment Yes  Education / Care Plan  Dialysis Education Provided Yes  Documented Education in Care Plan Yes  Hemodialysis Catheter Right Internal jugular Double-lumen  No Placement Date or Time found.   Placed prior to admission: Yes  Orientation: Right  Access Location: Internal jugular  Hemodialysis Catheter Type: Double-lumen  Site Condition No complications  Blue Lumen Status Heparin locked  Red Lumen Status Heparin locked  Purple Lumen Status N/A  Catheter fill solution Heparin 1000 units/ml  Catheter fill volume (Arterial) 1.5 cc  Catheter fill volume (Venous) 1.5  Dressing Type Biopatch  Dressing Status Clean;Dry;Intact  Drainage Description None  Post treatment catheter status Capped and Clamped

## 2017-07-10 NOTE — ED Notes (Signed)
Pt cleared by Dr Corky Downs to go to dialysis - transported by [facility] ED Tech

## 2017-07-10 NOTE — ED Notes (Signed)
Critical K+ of 7.2 reported to Dr Corky Downs - no new orders because pt is in dialysis

## 2017-07-10 NOTE — ED Triage Notes (Signed)
Here for dialysis per his routine.

## 2017-07-10 NOTE — Progress Notes (Signed)
[facility]  ROUNDING NOTE   Subjective:   Patient was last dialyzed 1 week ago.  He states he did not come on Wednesday or Friday.  He does not remember why he did not come on Wednesday but on Friday he had a medical appointment at [facility].  He did not go there either.  Today he states he is very weak.  He fell down twice at home and scraped his knees.  He is refusing x-rays. Labs today show a potassium of 7.2  Seen and examined on hemodialysis. Tolerating treatment well.    HEMODIALYSIS FLOWSHEET:  Blood Flow Rate (mL/min): 400 mL/min Arterial Pressure (mmHg): -190 mmHg Venous Pressure (mmHg): 130 mmHg Transmembrane Pressure (mmHg): 70 mmHg Ultrafiltration Rate (mL/min): 1760 mL/min Dialysate Flow Rate (mL/min): 800 ml/min Conductivity: Machine : 15.4 Conductivity: Machine : 15.4 Dialysis Fluid Bolus: Normal Saline Bolus Amount (mL): 250 mL Dialysate Change: 1K(potassium 7.1, MD aware)    Objective:  Vital signs in last 24 hours:  Temp:  [97.3 F (36.3 C)-97.9 F (36.6 C)] 97.3 F (36.3 C) (04/29 1200) Pulse Rate:  [82-96] 94 (04/29 1545) Resp:  [12-24] 24 (04/29 1545) BP: (114-150)/(76-95) 114/85 (04/29 1545) SpO2:  [100 %] 100 % (04/29 1318) Weight:  [159 lb (72.1 kg)] 159 lb (72.1 kg) (04/29 1142)  Weight change:  Filed Weights   07/10/17 1142  Weight: 159 lb (72.1 kg)    Intake/Output: No intake/output data recorded.   Intake/Output this shift:  No intake/output data recorded.  Physical Exam: General: NAD  Head: Normocephalic, atraumatic. Moist oral mucosal membranes  Eyes: Anicteric  Neck: Supple  Lungs:  , normal effort  Heart: Regular rate and rhythm  Abdomen:  Soft, nontender, distended  Extremities:  + peripheral edema.  Neurologic: Nonfocal, moving all four extremities  Skin: No lesions  Access: RUE AVF - no bruit or thrill, RIJ permcath    Basic Metabolic Panel: Recent Labs  Lab 07/03/17 1611 07/10/17 1152  NA 138 136  K  6.2* 7.2*  CL 100* 93*  CO2 27 32  GLUCOSE 109* 111*  BUN 64* 55*  CREATININE 10.59* 12.38*  CALCIUM 8.1* 8.5*  PHOS 7.0* 4.9*    Liver Function Tests: Recent Labs  Lab 07/03/17 1611 07/10/17 1152  ALBUMIN 2.2* 2.3*   No results for input(s): LIPASE, AMYLASE in the last 168 hours. No results for input(s): AMMONIA in the last 168 hours.  CBC: Recent Labs  Lab 07/03/17 1611  WBC 7.1  HGB 7.9*  HCT 25.7*  MCV 88.2  PLT 328    Cardiac Enzymes: No results for input(s): CKTOTAL, CKMB, CKMBINDEX, TROPONINI in the last 168 hours.  BNP: Invalid input(s): POCBNP  CBG: No results for input(s): GLUCAP in the last 168 hours.  Microbiology: Results for orders placed or performed during the hospital encounter of 06/02/17  MRSA PCR Screening     Status: None   Collection Time: 06/02/17  4:22 PM  Result Value Ref Range Status   MRSA by PCR NEGATIVE NEGATIVE Final    Comment:        The GeneXpert MRSA Assay (FDA approved for NASAL specimens only), is one component of a comprehensive MRSA colonization surveillance program. It is not intended to diagnose MRSA infection nor to guide or monitor treatment for MRSA infections. Performed at [facility], [address], [place], [place] [address]   Body fluid culture     Status: None   Collection Time: 06/05/17 10:28 AM  Result  Value Ref Range Status   Specimen Description   Final    PERITONEAL Performed at [facility], [address] [place], [place] [address]    Special Requests   Final    NONE Performed at [facility], [address] [place], [place] [address]    Gram Stain   Final    WBC PRESENT,BOTH PMN AND MONONUCLEAR NO ORGANISMS SEEN CYTOSPIN SMEAR    Culture   Final    NO GROWTH 3 DAYS Performed at [facility], [place] [address] [place], [place] [address]    Report Status 06/08/2017 FINAL  Final    Coagulation Studies: No results for input(s): LABPROT, INR in the  last 72 hours.  Urinalysis: No results for input(s): COLORURINE, LABSPEC, PHURINE, GLUCOSEU, HGBUR, BILIRUBINUR, KETONESUR, PROTEINUR, UROBILINOGEN, NITRITE, LEUKOCYTESUR in the last 72 hours.  Invalid input(s): APPERANCEUR    Imaging: No results found.   Medications:       Assessment/ Plan:  [name] is a 57 y.o. black male withend stage renal disease on hemodialysis secondary to Al[place]'s syndrome, ascites, hypertension, anemia of chronic kidney disease, coronary artery disease, peripheral vascular disease, hyperlipidemia, gastrointestinal AVMs, pulmonary hypertension,   nooutpatientdialysis unit currently  1.  Hyperkalemia Hemodialysis treatment with 2 K then 1 K bath Repeat BMP sent. Results not back yet  2.  End-stage renal disease: MWF schedule.  Seen and examined on hemodialysis. Tolerating treatment well.   3. Anemia with chronic kidney disease: acute blood loss from GI bleed/epistaxis.   Hemoglobin low EPO with HD  4. Dialysis device complication: AVF nonfunction. Currently using RIJ permcath Needs evaluation for a new access.   5. LE edema and pulm crackles/ edema UF goal ~ 4 L as tolerated    LOS: 0 [name] [name] 4/29/20193:57 PM

## 2017-07-12 ENCOUNTER — Encounter: Payer: Self-pay | Admitting: Emergency Medicine

## 2017-07-12 ENCOUNTER — Ambulatory Visit
Admission: EM | Admit: 2017-07-12 | Discharge: 2017-07-12 | Disposition: A | Discharge status: Home / Self Care | Payer: Medicare Other | Attending: Emergency Medicine | Admitting: Emergency Medicine

## 2017-07-12 ENCOUNTER — Other Ambulatory Visit: Payer: Self-pay

## 2017-07-12 DIAGNOSIS — I2510 Atherosclerotic heart disease of native coronary artery without angina pectoris: Secondary | ICD-10-CM | POA: Insufficient documentation

## 2017-07-12 DIAGNOSIS — N186 End stage renal disease: Secondary | ICD-10-CM | POA: Diagnosis not present

## 2017-07-12 DIAGNOSIS — K7460 Unspecified cirrhosis of liver: Secondary | ICD-10-CM | POA: Diagnosis not present

## 2017-07-12 DIAGNOSIS — I132 Hypertensive heart and chronic kidney disease with heart failure and with stage 5 chronic kidney disease, or end stage renal disease: Secondary | ICD-10-CM | POA: Insufficient documentation

## 2017-07-12 DIAGNOSIS — E785 Hyperlipidemia, unspecified: Secondary | ICD-10-CM | POA: Diagnosis not present

## 2017-07-12 DIAGNOSIS — F17210 Nicotine dependence, cigarettes, uncomplicated: Secondary | ICD-10-CM | POA: Diagnosis not present

## 2017-07-12 DIAGNOSIS — R188 Other ascites: Secondary | ICD-10-CM | POA: Insufficient documentation

## 2017-07-12 DIAGNOSIS — E875 Hyperkalemia: Secondary | ICD-10-CM | POA: Diagnosis not present

## 2017-07-12 DIAGNOSIS — I509 Heart failure, unspecified: Secondary | ICD-10-CM | POA: Diagnosis not present

## 2017-07-12 DIAGNOSIS — R600 Localized edema: Secondary | ICD-10-CM | POA: Diagnosis not present

## 2017-07-12 DIAGNOSIS — Z79899 Other long term (current) drug therapy: Secondary | ICD-10-CM | POA: Diagnosis not present

## 2017-07-12 DIAGNOSIS — Z992 Dependence on renal dialysis: Secondary | ICD-10-CM | POA: Insufficient documentation

## 2017-07-12 DIAGNOSIS — R0602 Shortness of breath: Secondary | ICD-10-CM | POA: Diagnosis not present

## 2017-07-12 DIAGNOSIS — K219 Gastro-esophageal reflux disease without esophagitis: Secondary | ICD-10-CM | POA: Insufficient documentation

## 2017-07-12 DIAGNOSIS — D631 Anemia in chronic kidney disease: Secondary | ICD-10-CM | POA: Diagnosis not present

## 2017-07-12 MED ORDER — IPRATROPIUM-ALBUTEROL 0.5-2.5 (3) MG/3ML IN SOLN: 3.0000 mL | Freq: Four times a day (QID) | RESPIRATORY_TRACT | Status: DC

## 2017-07-12 MED ORDER — DIPHENHYDRAMINE HCL 50 MG/ML IJ SOLN
25.0000 mg | Freq: Once | INTRAMUSCULAR | Status: AC
  Administered 2017-07-12: 25 mg via INTRAVENOUS

## 2017-07-12 MED ORDER — VANCOMYCIN HCL IN DEXTROSE 1-5 GM/200ML-% IV SOLN
1000.0000 mg | Freq: Once | INTRAVENOUS | Status: AC
  Administered 2017-07-12: 1000 mg via INTRAVENOUS
  Filled 2017-07-12 (×2): qty 200

## 2017-07-12 NOTE — Progress Notes (Signed)
Post HD assessment. Pt tolerated tx well, without c/o or complications. Net UF 2319. Pt requested to be taken off of tx early r/t having a ride home, MD aware.     07/12/17 1835  Vital Signs  Temp 98.9 F (37.2 C)  Temp Source Oral  Pulse Rate 95  Pulse Rate Source Monitor  Resp (!) 23  BP (!) 144/66  BP Location Left Arm  BP Method Automatic  Patient Position (if appropriate) Lying  Oxygen Therapy  SpO2 100 %  O2 Device Room Air  Dialysis Weight  Weight  (unable to weigh pt, no scale available )  Type of Weight Post-Dialysis  Post-Hemodialysis Assessment  Rinseback Volume (mL) 250 mL  KECN 62.5 V  Dialyzer Clearance Lightly streaked  Duration of HD Treatment -hour(s) 3 hour(s)  Hemodialysis Intake (mL) 700 mL  UF Total -Machine (mL) 3019 mL  Net UF (mL) 2319 mL  Tolerated HD Treatment Yes  Education / Care Plan  Dialysis Education Provided Yes  Documented Education in Care Plan Yes  Hemodialysis Catheter Right Internal jugular Double-lumen  No Placement Date or Time found.   Placed prior to admission: Yes  Orientation: Right  Access Location: Internal jugular  Hemodialysis Catheter Type: Double-lumen  Site Condition No complications  Blue Lumen Status Heparin locked  Red Lumen Status Heparin locked  Purple Lumen Status N/A  Catheter fill solution Heparin 1000 units/ml  Catheter fill volume (Arterial) 1.5 cc  Catheter fill volume (Venous) 1.5  Dressing Type Biopatch  Dressing Status Clean;Dry;Intact  Interventions New dressing  Drainage Description None  Post treatment catheter status Capped and Clamped

## 2017-07-12 NOTE — Progress Notes (Signed)
Pre HD assessment   07/12/17 1515  Vital Signs  Temp 97.9 F (36.6 C)  Temp Source Oral  Pulse Rate 88  Pulse Rate Source Monitor  Resp 17  BP (!) 142/91  BP Location Left Arm  BP Method Automatic  Patient Position (if appropriate) Lying  Oxygen Therapy  SpO2 100 %  O2 Device Room Air  Pain Assessment  Pain Scale 0-10  Pain Score 0  Dialysis Weight  Weight  (unable to weigh pt, no scale available )  Type of Weight Pre-Dialysis  Time-Out for Hemodialysis  What Procedure? HD  Pt Identifiers(min of two) First/Last Name;MRN/Account#  Correct Site? Yes  Correct Side? Yes  Correct Procedure? Yes  Consents Verified? Yes  Rad Studies Available? N/A  Safety Precautions Reviewed? Yes  Engineer, civil (consulting) Number  ([id number])  Station Number 3  UF/Alarm Test Passed  Conductivity: Meter 13.8  Conductivity: Machine  14.1  pH 7.6  Reverse Osmosis main  Normal Saline Lot Number [id number]  Dialyzer Lot Number [id number]  Disposable Set Lot Number [id number]  Machine Temperature 98.6 F (37 C)  Musician and Audible Yes  Blood Lines Intact and Secured Yes  Pre Treatment Patient Checks  Vascular access used during treatment Catheter  Hepatitis B Surface Antigen Results Negative  Date Hepatitis B Surface Antigen Drawn 03/01/17  Hepatitis B Surface Antibody  (>10)  Date Hepatitis B Surface Antibody Drawn 03/01/17  Hemodialysis Consent Verified Yes  Hemodialysis Standing Orders Initiated Yes  ECG (Telemetry) Monitor On Yes  Prime Ordered Normal Saline  Length of  DialysisTreatment -hour(s) 3.5 Hour(s)  Dialyzer Elisio 17H NR  Dialysate 2K, 2.5 Ca  Dialysis Anticoagulant None  Dialysate Flow Ordered 800  Blood Flow Rate Ordered 400 mL/min  Ultrafiltration Goal 3 Liters  Pre Treatment Labs  (cultures, )  Dialysis Blood Pressure Support Ordered Normal Saline  Education / Care Plan  Dialysis Education Provided Yes  Documented Education in Care Plan Yes  Hemodialysis Catheter  Right Internal jugular Double-lumen  No Placement Date or Time found.   Placed prior to admission: Yes  Orientation: Right  Access Location: Internal jugular  Hemodialysis Catheter Type: Double-lumen  Site Condition No complications  Blue Lumen Status Heparin locked  Red Lumen Status Heparin locked  Purple Lumen Status N/A  Dressing Type Biopatch  Dressing Status Clean;Dry;Intact  Interventions New dressing  Drainage Description None  Dressing Change Due 07/19/17

## 2017-07-12 NOTE — Progress Notes (Signed)
HD tx start    07/12/17 1531  Vital Signs  Pulse Rate 87  Pulse Rate Source Monitor  Resp (!) 24  BP (!) 141/85  BP Location Left Arm  BP Method Automatic  Patient Position (if appropriate) Lying  Oxygen Therapy  SpO2 100 %  O2 Device Room Air  During Hemodialysis Assessment  Blood Flow Rate (mL/min) 400 mL/min  Arterial Pressure (mmHg) -170 mmHg  Venous Pressure (mmHg) 140 mmHg  Transmembrane Pressure (mmHg) 60 mmHg  Ultrafiltration Rate (mL/min) 1000 mL/min  Dialysate Flow Rate (mL/min) 800 ml/min  Conductivity: Machine  13.9  HD Safety Checks Performed Yes  Dialysis Fluid Bolus Normal Saline  Bolus Amount (mL) 250 mL  Intra-Hemodialysis Comments Tx initiated  Hemodialysis Catheter Right Internal jugular Double-lumen  No Placement Date or Time found.   Placed prior to admission: Yes  Orientation: Right  Access Location: Internal jugular  Hemodialysis Catheter Type: Double-lumen  Blue Lumen Status Infusing  Red Lumen Status Infusing

## 2017-07-12 NOTE — ED Provider Notes (Signed)
Patient has returned from dialysis.  No complaints at this time.  To return Friday for next session.  To be discharged at this time.   Orbie Pyo, MD 07/12/17 [id number]

## 2017-07-12 NOTE — ED Provider Notes (Signed)
[facility] Emergency Department Provider Note  ____________________________________________   I have reviewed the triage vital signs and the nursing notes. Where available I have reviewed prior notes and, if possible and indicated, outside hospital notes.    HISTORY  Chief Complaint Dialysis    HPI Stanley [name] is a 57 y.o. male comes to the emergency room for dialysis Monday Wednesday and Friday, he states he did not miss last time, he has no complaints.  Past Medical History:  Diagnosis Date  . Alcohol abuse   . CHF (congestive heart failure) ([place])   . Cirrhosis ([place])   . Coronary artery disease 2009  . Drug abuse ([place])   . End stage renal disease on dialysis [facility]) NEPHROLOGIST-   [facility]  IN    HEMODIALYSIS --   TUES/  THURS/  SAT  . Gastrointestinal bleed 06/13/2017   From chart...hx of multiple GI bleeds  . GERD (gastroesophageal reflux disease)   . Hyperlipidemia   . Hypertension   . PAD (peripheral artery disease) ([place])   . Renal insufficiency    Per pt, 32 oz fluid restriction per day  . S/P triple vessel bypass 06/09/2016   2009ish  . Suicidal ideation    & HOMICIDAL IDEATION --  06-16-2013   ADMITTED TO BEHAVIOR HEALTH    Patient Active Problem List   Diagnosis Date Noted  . Protein-calorie malnutrition, severe 06/14/2017  . Encounter for dialysis [facility])   . Palliative care by specialist   . Goals of care, counseling/discussion   . Malnutrition of moderate degree 06/05/2017  . Secondary esophageal varices without bleeding ([place])   . Stomach irritation   . Idiopathic esophageal varices without bleeding ([place])   . Alcoholic hepatitis with ascites 05/24/2017  . ESRD (end stage renal disease) ([place]) 04/28/2017  . Uremia 03/08/2017  . ESRD on hemodialysis ([place]) 03/03/2017  . Weakness 02/28/2017  . Hypocalcemia 02/22/2017  . Shortness of breath 11/26/2016  . COPD (chronic obstructive pulmonary disease) ([place]) 10/30/2016   . COPD exacerbation ([place]) 10/29/2016  . Anemia   . Heme positive stool   . Ulceration of intestine   . Benign neoplasm of transverse colon   . Acute gastrointestinal hemorrhage   . Esophageal candidiasis ([place])   . Angiodysplasia of intestinal tract   . Acute respiratory failure with hypoxia ([place]) 07/03/2016  . GI bleeding 06/24/2016  . Rectal bleeding 06/14/2016  . Anemia of chronic disease 06/01/2016  . MRSA carrier 06/01/2016  . Chronic renal failure 05/23/2016  . Ischemic heart disease 05/23/2016  . Angiodysplasia of small intestine   . Melena   . Small bowel bleed not requiring more than 4 units of blood in 24 hours, ICU, or surgery   . Anemia due to chronic blood loss   . Abdominal pain 05/05/2016  . Acute posthemorrhagic anemia 04/17/2016  . Gastrointestinal bleed 04/17/2016  . History of esophagogastroduodenoscopy (EGD) 04/17/2016  . Elevated troponin 04/17/2016  . Alcohol abuse 04/17/2016  . Upper GI bleed 01/19/2016  . Blood in stool   . Angiodysplasia of stomach and duodenum with hemorrhage   . Gastritis   . Reflux esophagitis   . GI bleed 05/16/2015  . Acute GI bleeding   . Symptomatic anemia 04/30/2015  . HTN (hypertension) 04/06/2015  . GERD (gastroesophageal reflux disease) 04/06/2015  . HLD (hyperlipidemia) 04/06/2015  . Dyspnea 04/06/2015  . Cirrhosis of liver with ascites ([place]) 04/06/2015  . Ascites 04/06/2015  . GIB (gastrointestinal bleeding) 03/23/2015  .  Homicidal ideation 06/19/2013  . Suicidal intent 06/19/2013  . Homicidal ideations 06/19/2013  . Hyperkalemia 06/16/2013  . Mandible fracture ([place]) 06/05/2013  . Fracture, mandible ([place]) 06/02/2013  . Coronary atherosclerosis of native coronary artery 06/02/2013  . ESRD on dialysis ([place]) 06/02/2013  . Mandible open fracture ([place]) 06/02/2013    Past Surgical History:  Procedure Laterality Date  . A/V FISTULAGRAM Right 06/06/2017   Procedure: A/V FISTULAGRAM;  Surgeon: Katha Cabal, MD;   Location: [place] [facility];  Service: Cardiovascular;  Laterality: Right;  . A/V SHUNT INTERVENTION N/A 06/06/2017   Procedure: A/V SHUNT INTERVENTION;  Surgeon: Katha Cabal, MD;  Location: [place] [facility];  Service: Cardiovascular;  Laterality: N/A;  . AGILE CAPSULE N/A 06/19/2016   Procedure: AGILE CAPSULE;  Surgeon: [name], MD;  Location: [facility] ENDOSCOPY;  Service: Endoscopy;  Laterality: N/A;  . COLONOSCOPY WITH PROPOFOL N/A 06/18/2016   Procedure: COLONOSCOPY WITH PROPOFOL;  Surgeon: [name], MD;  Location: [facility] ENDOSCOPY;  Service: Endoscopy;  Laterality: N/A;  . COLONOSCOPY WITH PROPOFOL N/A 08/12/2016   Procedure: COLONOSCOPY WITH PROPOFOL;  Surgeon: [name], MD;  Location: [facility] ENDOSCOPY;  Service: Endoscopy;  Laterality: N/A;  . COLONOSCOPY WITH PROPOFOL N/A 05/05/2017   Procedure: COLONOSCOPY WITH PROPOFOL;  Surgeon: Manya Silvas, MD;  Location: [facility] ENDOSCOPY;  Service: Endoscopy;  Laterality: N/A;  . CORONARY ANGIOPLASTY  ?   PT UNABLE TO TELL IF  BEFORE OR AFTER  CABG  . CORONARY ARTERY BYPASS GRAFT  2008  ([place])   3 VESSEL  . DIALYSIS FISTULA CREATION  LAST SURGERY  APPOX  2008  . ENTEROSCOPY N/A 05/10/2016   Procedure: ENTEROSCOPY;  Surgeon: Jerene Bears, MD;  Location: [place];  Service: Gastroenterology;  Laterality: N/A;  . ENTEROSCOPY N/A 08/12/2016   Procedure: ENTEROSCOPY;  Surgeon: [name], MD;  Location: [facility] ENDOSCOPY;  Service: Endoscopy;  Laterality: N/A;  . ENTEROSCOPY Left 06/03/2017   Procedure: ENTEROSCOPY;  Surgeon: Virgel Manifold, MD;  Location: [facility] ENDOSCOPY;  Service: Endoscopy;  Laterality: Left;  Procedure date will ultimately depend on when patient is medically optimized before the procedure, pending hemodialysis and blood transfusions etc. Will place on schedule and change depending on clinical status.   . ENTEROSCOPY N/A 06/05/2017   Procedure: ENTEROSCOPY;  Surgeon: Virgel Manifold, MD;  Location: [facility]  ENDOSCOPY;  Service: Endoscopy;  Laterality: N/A;  . ENTEROSCOPY N/A 06/15/2017   Procedure: Push ENTEROSCOPY;  Surgeon: [name], MD;  Location: [facility] ENDOSCOPY;  Service: Endoscopy;  Laterality: N/A;  . ESOPHAGOGASTRODUODENOSCOPY N/A 05/07/2015   Procedure: ESOPHAGOGASTRODUODENOSCOPY (EGD);  Surgeon: Hulen Luster, MD;  Location: [facility] ENDOSCOPY;  Service: Endoscopy;  Laterality: N/A;  . ESOPHAGOGASTRODUODENOSCOPY (EGD) WITH PROPOFOL N/A 05/17/2015   Procedure: ESOPHAGOGASTRODUODENOSCOPY (EGD) WITH PROPOFOL;  Surgeon: [name], MD;  Location: [facility] ENDOSCOPY;  Service: Endoscopy;  Laterality: N/A;  . ESOPHAGOGASTRODUODENOSCOPY (EGD) WITH PROPOFOL N/A 01/20/2016   Procedure: ESOPHAGOGASTRODUODENOSCOPY (EGD) WITH PROPOFOL;  Surgeon: [name], MD;  Location: [facility] ENDOSCOPY;  Service: Endoscopy;  Laterality: N/A;  . ESOPHAGOGASTRODUODENOSCOPY (EGD) WITH PROPOFOL N/A 04/17/2016   Procedure: ESOPHAGOGASTRODUODENOSCOPY (EGD) WITH PROPOFOL;  Surgeon: Lin Landsman, MD;  Location: [facility] ENDOSCOPY;  Service: Gastroenterology;  Laterality: N/A;  . ESOPHAGOGASTRODUODENOSCOPY (EGD) WITH PROPOFOL  05/09/2016   Procedure: ESOPHAGOGASTRODUODENOSCOPY (EGD) WITH PROPOFOL;  Surgeon: Jerene Bears, MD;  Location: [employer] [facility] ENDOSCOPY;  Service: Endoscopy;;  . ESOPHAGOGASTRODUODENOSCOPY (EGD) WITH PROPOFOL N/A 06/16/2016   Procedure: ESOPHAGOGASTRODUODENOSCOPY (EGD) WITH PROPOFOL;  Surgeon:  [name], MD;  Location: [facility] ENDOSCOPY;  Service: Endoscopy;  Laterality: N/A;  . ESOPHAGOGASTRODUODENOSCOPY (EGD) WITH PROPOFOL N/A 05/05/2017   Procedure: ESOPHAGOGASTRODUODENOSCOPY (EGD) WITH PROPOFOL;  Surgeon: Manya Silvas, MD;  Location: [facility] ENDOSCOPY;  Service: Endoscopy;  Laterality: N/A;  . ESOPHAGOGASTRODUODENOSCOPY (EGD) WITH PROPOFOL N/A 06/15/2017   Procedure: ESOPHAGOGASTRODUODENOSCOPY (EGD) WITH PROPOFOL;  Surgeon: [name], MD;  Location: [facility] ENDOSCOPY;  Service: Endoscopy;  Laterality: N/A;  . GIVENS CAPSULE STUDY  N/A 05/07/2016   Procedure: GIVENS CAPSULE STUDY;  Surgeon: Doran Stabler, MD;  Location: [place];  Service: Endoscopy;  Laterality: N/A;  . MANDIBULAR HARDWARE REMOVAL N/A 07/29/2013   Procedure: REMOVAL OF ARCH BARS;  Surgeon: Theodoro Kos, DO;  Location: [place];  Service: Plastics;  Laterality: N/A;  . ORIF MANDIBULAR FRACTURE N/A 06/05/2013   Procedure: REPAIR OF MANDIBULAR FRACTURE x 2 with maxillo-mandibular fixation ;  Surgeon: Theodoro Kos, DO;  Location: [place];  Service: Plastics;  Laterality: N/A;  . PERIPHERAL ARTERIAL STENT GRAFT Left     Prior to Admission medications   Medication Sig Start Date End Date Taking? Authorizing Provider  albuterol (PROVENTIL HFA;VENTOLIN HFA) 108 (90 Base) MCG/ACT inhaler Inhale 2 puffs into the lungs every 6 (six) hours as needed for wheezing or shortness of breath. 04/21/17   Lisa Roca, MD  alum & mag hydroxide-simeth (MAALOX/MYLANTA) 200-200-20 MG/5ML suspension Take 15 mLs every 4 (four) hours as needed by mouth for indigestion or heartburn. Patient not taking: Reported on 04/19/2017 01/25/17   Demetrios Loll, MD  atorvastatin (LIPITOR) 40 MG tablet Take 1 tablet (40 mg total) by mouth daily. 11/16/16   Vaughan Basta, MD  budesonide-formoterol (SYMBICORT) 160-4.5 MCG/ACT inhaler Inhale 2 puffs into the lungs daily. 11/16/16   Vaughan Basta, MD  calcium acetate (PHOSLO) 667 MG capsule Take 2,001 mg by mouth 3 (three) times daily.    [provider]  doxycycline (VIBRA-TABS) 100 MG tablet Take 1 tablet (100 mg total) by mouth 2 (two) times daily. 07/10/17   Harvest Dark, MD  ferrous sulfate 325 (65 FE) MG tablet Take 1 tablet by mouth 3 (three) times daily. 01/21/17   [provider]  furosemide (LASIX) 80 MG tablet Take 1 tablet (80 mg total) daily by mouth. 01/25/17   Demetrios Loll, MD  gabapentin (NEURONTIN) 300 MG capsule Take 1 capsule by mouth daily as directed 08/16/16   [provider]  ipratropium-albuterol (DUONEB) 0.5-2.5 (3) MG/3ML SOLN Take 3 mLs by nebulization every 6 (six) hours as needed. 11/28/16   Loletha Grayer, MD  labetalol (NORMODYNE) 100 MG tablet Take 100 mg by mouth daily. 03/24/17   [provider]  Multiple Vitamins-Minerals-FA (DIALYVITE SUPREME D) 3 MG TABS Take 1 tablet (3 mg total) by mouth daily. 11/28/16   Loletha Grayer, MD  nitroGLYCERIN (NITROSTAT) 0.4 MG SL tablet Place 1 tablet (0.4 mg total) under the tongue every 5 (five) minutes as needed. 04/12/16   Wende Bushy, MD  pantoprazole (PROTONIX) 40 MG tablet Take 1 tablet (40 mg total) by mouth daily. 11/28/16   Loletha Grayer, MD  Spacer/Aero Chamber Mouthpiece MISC 1 Units by Does not apply route every 4 (four) hours as needed (wheezing). 02/19/17   Darel Hong, MD  tiotropium (SPIRIVA HANDIHALER) 18 MCG inhalation capsule Place 1 capsule (18 mcg total) into inhaler and inhale daily. 11/16/16 11/16/17  Vaughan Basta, MD    Allergies Patient has no known allergies.  Family History  Problem Relation Age of  Onset  . Colon cancer Mother   . Cancer Father   . Cancer Sister   . Kidney disease Brother     Social History Social History   Tobacco Use  . Smoking status: Current Every Day Smoker    Packs/day: 0.15    Years: 40.00    Pack years: 6.00    Types: Cigarettes  . Smokeless tobacco: Never Used  Substance Use Topics  . Alcohol use: No    Comment: pt reports quitting after learning about cirrhosis  . Drug use: No    Frequency: 7.0 times per week    Types: Marijuana, Cocaine    Review of Systems Constitutional: No fever/chills Respiratory: Denies shortness of breath.    ____________________________________________   PHYSICAL EXAM:  VITAL SIGNS: ED Triage Vitals  Enc Vitals Group     BP 07/12/17 1200 138/74     Pulse Rate 07/12/17 1200 88     Resp 07/12/17 1200 16     Temp 07/12/17 1200 98.7 F (37.1 C)     Temp Source 07/12/17  1200 Oral     SpO2 07/12/17 1200 98 %     Weight 07/12/17 1200 159 lb (72.1 kg)     Height 07/12/17 1200 6\' 3"  (1.905 m)     Head Circumference --      Peak Flow --      Pain Score 07/12/17 1202 0     Pain Loc --      Pain Edu? --      Excl. in [place]? --     Constitutional: Alert and oriented. Well appearing and in no acute distress. Eyes: Conjunctivae are normal Head: Atraumatic Cardiovascular: Normal rate, regular rhythm. Grossly normal heart sounds.  Good peripheral circulation. Respiratory: Normal respiratory effort.  No retractions. Lungs CTAB. Abdominal: Soft and nontender. No distention. No guarding no rebound Skin:  Skin is warm, dry and intact. No rash noted. Psychiatric: Mood and affect are normal. Speech and behavior are normal.  ____________________________________________   LABS (all labs ordered are listed, but only abnormal results are displayed)  Labs Reviewed - No data to display  Pertinent labs  results that were available during my care of the patient were reviewed by me and considered in my medical decision making (see chart for details). ____________________________________________  EKG  I personally interpreted any EKGs ordered by me or triage  ____________________________________________  RADIOLOGY  Pertinent labs & imaging results that were available during my care of the patient were reviewed by me and considered in my medical decision making (see chart for details). If possible, patient and/or family made aware of any abnormal findings.  No results found. ____________________________________________    PROCEDURES  Procedure(s) performed: None  Procedures  Critical Care performed: None  ____________________________________________   INITIAL IMPRESSION / ASSESSMENT AND PLAN / ED COURSE  Pertinent labs & imaging results that were available during my care of the patient were reviewed by me and considered in my medical decision making (see  chart for details).  Patient here for routine dialysis which apparently has to happen via the emergency department.   ____________________________________________   FINAL CLINICAL IMPRESSION(S) / ED DIAGNOSES  Final diagnoses:  None      This chart was dictated using voice recognition software.  Despite best efforts to proofread,  errors can occur which can change meaning.      Schuyler Amor, MD 07/12/17 [phone]

## 2017-07-12 NOTE — ED Triage Notes (Signed)
Pt presents to ED for routine dialysis. NAD noted.

## 2017-07-12 NOTE — Progress Notes (Signed)
Post HD assessment    07/12/17 1834  Neurological  Level of Consciousness Alert  Orientation Level Oriented X4  Respiratory  Respiratory Pattern Regular;Unlabored  Chest Assessment Chest expansion symmetrical  Cardiac  ECG Monitor Yes  Vascular  R Radial Pulse +2  L Radial Pulse +2  Integumentary  Integumentary (WDL) X  Skin Color Appropriate for ethnicity  Musculoskeletal  Musculoskeletal (WDL) X  Generalized Weakness Yes  Assistive Device None  GU Assessment  Genitourinary (WDL) X  Genitourinary Symptoms  (HD)  Psychosocial  Psychosocial (WDL) WDL

## 2017-07-12 NOTE — Discharge Summary (Signed)
[place] [facility] [place] NAME: Stanley [name]    MR#:  [id number]  DATE OF BIRTH:  1961/02/28  DATE OF ADMISSION:  06/12/2017 ADMITTING PHYSICIAN: Nicholes Mango, MD  DATE OF DISCHARGE: 06/16/2017  1:30 PM  PRIMARY CARE PHYSICIAN: [name], MD    ADMISSION DIAGNOSIS:  Acute hyperkalemia [E87.5] Encounter for dialysis [facility]) [Z99.2] Gastrointestinal hemorrhage, unspecified gastrointestinal hemorrhage type [K92.2] GI bleed [K92.2]  DISCHARGE DIAGNOSIS:  Active Problems:   GI bleed   ESRD on hemodialysis (HCC)   Protein-calorie malnutrition, severe   Encounter for dialysis [facility])   Palliative care by specialist   Goals of care, counseling/discussion   SECONDARY DIAGNOSIS:   Past Medical History:  Diagnosis Date  . Alcohol abuse   . CHF (congestive heart failure) ([place])   . Cirrhosis ([place])   . Coronary artery disease 2009  . Drug abuse ([place])   . End stage renal disease on dialysis [facility]) NEPHROLOGIST-   DR [facility]  IN [place]   HEMODIALYSIS --   TUES/  THURS/  SAT  . Gastrointestinal bleed 06/13/2017   From chart...hx of multiple GI bleeds  . GERD (gastroesophageal reflux disease)   . Hyperlipidemia   . Hypertension   . PAD (peripheral artery disease) ([place])   . Renal insufficiency    Per pt, 32 oz fluid restriction per day  . S/P triple vessel bypass 06/09/2016   2009ish  . Suicidal ideation    & HOMICIDAL IDEATION --  06-16-2013   ADMITTED TO [place] COURSE:   1acute melena GI bleed Continued dark lower GI bleeding  Noted history of cirrhosis, peptic ulcer disease, history of intestinal AVMs Hematemesis and melenaon admission In discussion with gastroenterology today/Dr. Allen Norris -patient has had extensive upper and lower frequent endoscopies by his gastroenterology group/patient noncompliant with following up at [facility] for further workup/gastroenterology   s/p 2 units packed red blood cells, H&H every  6 hours, CBC daily, and transfuse as needed Most recent endoscopyshowngastritis  EGD done , no active bleeding spots, but some bleed from posterior nasal cavity noted.  2acute symptomatic anemia Improved status post transfusion Plan of care per above  3 acute hyperkalemia Resolved withKayexalateand HD  4 chronic ESRD HD forMonday Wednesday and Friday Nephrology input appreciated-currently receiving hemodialysis  5 chronic tobaccoabuse disorder Stable Nicotine patch andcessation counseling ordered  6COPD without exacerbation Noted chronic cough Breathing treatments as needed, Pulmicort  7acute on chronic malnutrition  Dietary consulted  8 acute on chronic noncompliance of medical management Patient threatening to leave AMA despite serious medical condition multiple times in past.    DISCHARGE CONDITIONS:   Stable.  CONSULTS OBTAINED:  Treatment Team:  Lavonia Dana, MD Schnier, Dolores Lory, MD  DRUG ALLERGIES:  No Known Allergies  DISCHARGE MEDICATIONS:   Allergies as of 06/16/2017   No Known Allergies     Medication List    TAKE these medications   albuterol 108 (90 Base) MCG/ACT inhaler Commonly known as:  PROVENTIL HFA;VENTOLIN HFA Inhale 2 puffs into the lungs every 6 (six) hours as needed for wheezing or shortness of breath.   alum & mag hydroxide-simeth 200-200-20 MG/5ML suspension Commonly known as:  MAALOX/MYLANTA Take 15 mLs every 4 (four) hours as needed by mouth for indigestion or heartburn.   atorvastatin 40 MG tablet Commonly known as:  LIPITOR Take 1 tablet (40 mg total) by mouth daily.   budesonide-formoterol 160-4.5 MCG/ACT inhaler Commonly known as:  SYMBICORT  Inhale 2 puffs into the lungs daily.   calcium acetate 667 MG capsule Commonly known as:  PHOSLO Take 2,001 mg by mouth 3 (three) times daily.   DIALYVITE SUPREME D 3 MG Tabs Take 1 tablet (3 mg total) by mouth daily.   ferrous sulfate 325 (65 FE) MG  tablet Take 1 tablet by mouth 3 (three) times daily.   furosemide 80 MG tablet Commonly known as:  LASIX Take 1 tablet (80 mg total) daily by mouth.   gabapentin 300 MG capsule Commonly known as:  NEURONTIN Take 1 capsule by mouth daily as directed   ipratropium-albuterol 0.5-2.5 (3) MG/3ML Soln Commonly known as:  DUONEB Take 3 mLs by nebulization every 6 (six) hours as needed.   labetalol 100 MG tablet Commonly known as:  NORMODYNE Take 100 mg by mouth daily.   nitroGLYCERIN 0.4 MG SL tablet Commonly known as:  NITROSTAT Place 1 tablet (0.4 mg total) under the tongue every 5 (five) minutes as needed.   pantoprazole 40 MG tablet Commonly known as:  PROTONIX Take 1 tablet (40 mg total) by mouth daily.   Spacer/Aero Chamber Mouthpiece Misc 1 Units by Does not apply route every 4 (four) hours as needed (wheezing).   tiotropium 18 MCG inhalation capsule Commonly known as:  SPIRIVA HANDIHALER Place 1 capsule (18 mcg total) into inhaler and inhale daily.        DISCHARGE INSTRUCTIONS:    Follow with PMD as needed.  If you experience worsening of your admission symptoms, develop shortness of breath, life threatening emergency, suicidal or homicidal thoughts you must seek medical attention immediately by calling 911 or calling your MD immediately  if symptoms less severe.  You Must read complete instructions/literature along with all the possible adverse reactions/side effects for all the Medicines you take and that have been prescribed to you. Take any new Medicines after you have completely understood and accept all the possible adverse reactions/side effects.   Please note  You were cared for by a hospitalist during your hospital stay. If you have any questions about your discharge medications or the care you received while you were in the hospital after you are discharged, you can call the unit and asked to speak with the hospitalist on call if the hospitalist that took  care of you is not available. Once you are discharged, your primary care physician will handle any further medical issues. Please note that NO REFILLS for any discharge medications will be authorized once you are discharged, as it is imperative that you return to your primary care physician (or establish a relationship with a primary care physician if you do not have one) for your aftercare needs so that they can reassess your need for medications and monitor your lab values.    Today   CHIEF COMPLAINT:   Chief Complaint  Patient presents with  . regular dialysis  . Rectal Bleeding    HISTORY OF PRESENT ILLNESS:  Stanley [name]  is a 57 y.o. male with a known history of end-stage renal disease on hemodialysis Monday Wednesday and Friday comes to  emergency department 3 times a week for dialysis is reporting shortness of breath and abdominal pain associated with the rectal bleeding and hematemesis.  Hemoglobin is at 5.2 which was at 7.9 before patient recently had EGD done which has revealed gastritis. Potassium is elevated at 6.9 and a nephrology is consulted fo dialysis    VITAL SIGNS:  Blood pressure 123/70, pulse 87, temperature 98.9 F (  37.2 C), temperature source Oral, resp. rate 18, height 6\' 3"  (1.905 m), weight 70.8 kg (156 lb), SpO2 94 %.  I/O:  No intake or output data in the 24 hours ending 07/12/17 1555  PHYSICAL EXAMINATION:   GENERAL:  57 y.o.-year-old patient lying in the bed with no acute distress.  EYES: Pupils equal, round, reactive to light and accommodation. No scleral icterus. Extraocular muscles intact.  HEENT: Head atraumatic, normocephalic. Oropharynx and nasopharynx clear.  NECK:  Supple, no jugular venous distention. No thyroid enlargement, no tenderness.  LUNGS: Normal breath sounds bilaterally, no wheezing, rales,rhonchi or crepitation. No use of accessory muscles of respiration.  CARDIOVASCULAR: S1, S2 normal. No murmurs, rubs, or gallops.  ABDOMEN:  Soft, nontender, nondistended. Bowel sounds present. No organomegaly or mass.  EXTREMITIES: No pedal edema, cyanosis, or clubbing.  NEUROLOGIC: Cranial nerves II through XII are intact. Muscle strength 4/5 in all extremities. Sensation intact. Gait not checked.  PSYCHIATRIC: The patient is alert and oriented x 3.  SKIN: No obvious rash, lesion, or ulcer.     DATA REVIEW:   CBC No results for input(s): WBC, HGB, HCT, PLT in the last 168 hours.  Chemistries  Recent Labs  Lab 07/10/17 1152 07/10/17 1516  NA 136  --   K 7.2* 4.0  CL 93*  --   CO2 32  --   GLUCOSE 111*  --   BUN 55*  --   CREATININE 12.38*  --   CALCIUM 8.5*  --     Cardiac Enzymes No results for input(s): TROPONINI in the last 168 hours.  Microbiology Results  Results for orders placed or performed during the hospital encounter of 06/02/17  MRSA PCR Screening     Status: None   Collection Time: 06/02/17  4:22 PM  Result Value Ref Range Status   MRSA by PCR NEGATIVE NEGATIVE Final    Comment:        The GeneXpert MRSA Assay (FDA approved for NASAL specimens only), is one component of a comprehensive MRSA colonization surveillance program. It is not intended to diagnose MRSA infection nor to guide or monitor treatment for MRSA infections. Performed at [facility], [address], [place], [place] [address]   Body fluid culture     Status: None   Collection Time: 06/05/17 10:28 AM  Result Value Ref Range Status   Specimen Description   Final    PERITONEAL Performed at [facility], [address] [place], [place] [address]    Special Requests   Final    NONE Performed at [facility], [address] [place], [place] [address]    Gram Stain   Final    WBC PRESENT,BOTH PMN AND MONONUCLEAR NO ORGANISMS SEEN CYTOSPIN SMEAR    Culture   Final    NO GROWTH 3 DAYS Performed at [facility], [place] [address] [place], [place] [address]    Report Status  06/08/2017 FINAL  Final    RADIOLOGY:  Dg Chest 2 View  Result Date: 07/10/2017 CLINICAL DATA:  Cough for 2 days.  Renal failure. EXAM: CHEST - 2 VIEW COMPARISON:  Chest radiograph June 30, 2017 and chest CT June 03, 2017 FINDINGS: Central catheter tip is in the superior vena cava. No pneumothorax. There is patchy airspace opacity throughout the lungs bilaterally, similar to 10 days prior but less than on prior CT examination. No frank consolidation is noted. Heart is mildly enlarged, stable, with pulmonary vascularity within normal limits.  Patient is status post coronary artery bypass grafting. There is aortic atherosclerosis as well as calcification in the left carotid artery. IMPRESSION: Patchy airspace opacity bilaterally, significantly less than on prior CT but similar to recent prior chest radiograph. Suspect patchy pneumonia, although a degree of pulmonary edema could present in this manner. Stable cardiac prominence. There is aortic atherosclerosis. No adenopathy evident. Aortic Atherosclerosis (ICD10-I70.0). Electronically Signed   By: Lowella Grip III M.D.   On: 07/10/2017 17:22    EKG:   Orders placed or performed during the hospital encounter of 06/12/17  . ED EKG  . ED EKG  . EKG 12-Lead  . EKG 12-Lead  . EKG 12-Lead  . EKG 12-Lead  . EKG      Management plans discussed with the patient, family and they are in agreement.  CODE STATUS:  Code Status History    Date Active Date Inactive Code Status Order ID Comments User Context   06/12/2017 1707 06/16/2017 1704 Full Code [id number]  Nicholes Mango, MD Inpatient   06/02/2017 1603 06/06/2017 2222 Full Code [id number]  Demetrios Loll, MD Inpatient   05/24/2017 1831 05/25/2017 1921 Full Code [id number]  Saundra Shelling, MD ED   05/03/2017 1331 05/05/2017 1806 Full Code [id number]  Bettey Costa, MD Inpatient   04/01/2017 0947 04/03/2017 1156 Full Code [id number]  Loletha Grayer, MD ED   03/09/2017 0010 03/09/2017 1928 Full Code [id number]   Lance Coon, MD Inpatient   03/03/2017 2027 03/04/2017 1920 Full Code [id number]  Demetrios Loll, MD Inpatient   02/28/2017 2254 03/02/2017 1519 Full Code [id number]  Lance Coon, MD Inpatient   02/22/2017 1721 02/24/2017 1359 Full Code [id number]  Dustin Flock, MD Inpatient   01/22/2017 2337 01/25/2017 1522 Full Code [id number]  Gorden Harms, MD Inpatient   01/07/2017 1445 01/13/2017 1812 Full Code [id number]  Idelle Crouch, MD Inpatient   12/22/2016 1431 12/24/2016 1804 Full Code [id number]  Hillary Bow, MD ED   12/20/2016 2020 12/21/2016 1853 Full Code [id number]  Demetrios Loll, MD Inpatient   11/26/2016 1030 11/28/2016 1522 Full Code [id number]  Henreitta Leber, MD Inpatient   11/15/2016 1930 11/16/2016 1822 Full Code [id number]  Fritzi Mandes, MD Inpatient   10/29/2016 1453 10/30/2016 1506 Full Code [id number]  Loletha Grayer, MD ED   09/15/2016 1757 09/16/2016 1817 Full Code [id number]  Dustin Flock, MD ED   08/16/2016 0945 08/18/2016 1816 Full Code [id number]  Hillary Bow, MD ED   08/09/2016 1659 08/12/2016 2034 Full Code [id number]  Fritzi Mandes, MD Inpatient   07/03/2016 2154 07/05/2016 2122 Full Code [id number]  Demetrios Loll, MD Inpatient   06/24/2016 0423 06/26/2016 1546 Full Code [id number]  Saundra Shelling, MD Inpatient   06/14/2016 1453 06/19/2016 2104 Full Code [id number]  Hillary Bow, MD ED   05/31/2016 0642 06/01/2016 2130 Full Code [id number]  Saundra Shelling, MD Inpatient   05/05/2016 2309 05/10/2016 2157 Full Code [id number]  Vianne Bulls, MD Inpatient   04/16/2016 0356 04/17/2016 1827 Full Code [id number]  Saundra Shelling, MD Inpatient   01/19/2016 1513 01/22/2016 1414 Full Code [id number]  Loletha Grayer, MD ED   09/24/2015 1033 09/25/2015 1331 Full Code [id number]  Loletha Grayer, MD ED   05/16/2015 1417 05/17/2015 2057 Full Code [id number]  Idelle Crouch, MD Inpatient   04/30/2015 2008 05/08/2015 1940 Full Code [id number]  Vaughan Basta, MD Inpatient   04/06/2015 0507 04/07/2015 1736 Full Code [id number]   Lance Coon, MD Inpatient   03/23/2015 1107 03/27/2015 1246 Full Code [id number]  Mody,  Sital, MD Inpatient   06/19/2013 1132 06/21/2013 1749 Full Code [id number]  Cristal Ford, DO Inpatient   06/16/2013 1726 06/19/2013 1132 Full Code [id number]  Margarita Mail, PA-C ED   06/05/2013 1533 06/06/2013 1856 Full Code [id number]  Theodoro Kos, DO Inpatient   06/02/2013 0901 06/05/2013 1533 Full Code [id number]  Velvet Bathe, MD Inpatient   06/02/2013 0840 06/02/2013 0901 Full Code [id number]  Velvet Bathe, MD Inpatient      TOTAL TIME TAKING CARE OF THIS PATIENT: 35 minutes.    Vaughan Basta M.D on 07/12/2017 at 3:55 PM  Between 7am to 6pm - Pager - [phone]  After 6pm go to [email or web address] - password [email or web address] [facility] [place] [facility]  Office  [phone]  CC: Primary care physician; [name], MD   Note: This dictation was prepared with Dragon dictation along with smaller phrase technology. Any transcriptional errors that result from this process are unintentional.

## 2017-07-12 NOTE — Progress Notes (Signed)
Pre HD assessment    07/12/17 1520  Neurological  Level of Consciousness Alert  Orientation Level Oriented X4  Respiratory  Respiratory Pattern Regular;Unlabored  Chest Assessment Chest expansion symmetrical  Cardiac  ECG Monitor Yes  Vascular  R Radial Pulse +2  L Radial Pulse +2  Integumentary  Integumentary (WDL) X  Skin Color Appropriate for ethnicity  Musculoskeletal  Musculoskeletal (WDL) X  Generalized Weakness Yes  Assistive Device None  GU Assessment  Genitourinary (WDL) X  Genitourinary Symptoms  (HD)  Psychosocial  Psychosocial (WDL) WDL

## 2017-07-12 NOTE — Progress Notes (Signed)
HD tx end   07/12/17 1833  Vital Signs  Pulse Rate 94  Pulse Rate Source Monitor  Resp 20  BP 123/80  BP Location Left Arm  BP Method Automatic  Patient Position (if appropriate) Lying  Oxygen Therapy  SpO2 100 %  O2 Device Room Air  During Hemodialysis Assessment  Dialysis Fluid Bolus Normal Saline  Bolus Amount (mL) 250 mL  Intra-Hemodialysis Comments Tx completed

## 2017-07-13 NOTE — Progress Notes (Signed)
[facility]  ROUNDING NOTE   Subjective:  Late entry: Patient seen during HD treatment Reports SOB, abdominal distention   Objective:  Vital signs in last 24 hours:  Temp:  [97.9 F (36.6 C)-98.9 F (37.2 C)] 98.9 F (37.2 C) (05/01 1835) Pulse Rate:  [84-114] 88 (05/01 1858) Resp:  [16-24] 20 (05/01 1858) BP: (123-169)/(66-98) 142/86 (05/01 1858) SpO2:  [94 %-100 %] 100 % (05/01 1858) Weight:  [159 lb (72.1 kg)] 159 lb (72.1 kg) (05/01 1200)  Weight change:  Filed Weights   07/12/17 1200  Weight: 159 lb (72.1 kg)    Intake/Output: No intake/output data recorded.   Intake/Output this shift:  Total I/O In: -  Out: 2319 [Other:2319]  Physical Exam: General: NAD  Head: Normocephalic, atraumatic. Moist oral mucosal membranes  Eyes: Anicteric  Neck: Supple  Lungs:  normal effort, b/l mild crackles  Heart: Regular rate and rhythm  Abdomen:  Soft, nontender, distended  Extremities:  + peripheral edema.  Neurologic: Nonfocal, moving all four extremities  Skin: No lesions  Access: RUE AVF - no bruit or thrill, RIJ permcath    Basic Metabolic Panel: Recent Labs  Lab 07/10/17 1152 07/10/17 1516  NA 136  --   K 7.2* 4.0  CL 93*  --   CO2 32  --   GLUCOSE 111*  --   BUN 55*  --   CREATININE 12.38*  --   CALCIUM 8.5*  --   PHOS 4.9*  --     Liver Function Tests: Recent Labs  Lab 07/10/17 1152  ALBUMIN 2.3*   No results for input(s): LIPASE, AMYLASE in the last 168 hours. No results for input(s): AMMONIA in the last 168 hours.  CBC: No results for input(s): WBC, NEUTROABS, HGB, HCT, MCV, PLT in the last 168 hours.  Cardiac Enzymes: No results for input(s): CKTOTAL, CKMB, CKMBINDEX, TROPONINI in the last 168 hours.  BNP: Invalid input(s): POCBNP  CBG: No results for input(s): GLUCAP in the last 168 hours.  Microbiology: Results for orders placed or performed during the hospital encounter of 07/12/17  Culture, blood (Routine X 2) w  Reflex to ID Panel     Status: None (Preliminary result)   Collection Time: 07/12/17  4:43 PM  Result Value Ref Range Status   Specimen Description BLOOD LINE CENTRAL  Final   Special Requests   Final    BOTTLES DRAWN AEROBIC AND ANAEROBIC Blood Culture adequate volume   Culture   Final    NO GROWTH < 24 HOURS Performed at [facility], [address] [place], [place] [address]    Report Status PENDING  Incomplete    Coagulation Studies: No results for input(s): LABPROT, INR in the last 72 hours.  Urinalysis: No results for input(s): COLORURINE, LABSPEC, PHURINE, GLUCOSEU, HGBUR, BILIRUBINUR, KETONESUR, PROTEINUR, UROBILINOGEN, NITRITE, LEUKOCYTESUR in the last 72 hours.  Invalid input(s): APPERANCEUR    Imaging: No results found.   Medications:       Assessment/ Plan:  [name] is a 57 y.o. black male withend stage renal disease on hemodialysis secondary to Alport's syndrome, ascites, hypertension, anemia of chronic kidney disease, coronary artery disease, peripheral vascular disease, hyperlipidemia, gastrointestinal AVMs, pulmonary hypertension,   nooutpatientdialysis unit currently  1.  Hyperkalemia Hemodialysis treatment with 2 K    2.  End-stage renal disease: MWF schedule.  Seen and examined on hemodialysis. Tolerating treatment well.   3. Anemia with chronic kidney disease: acute blood loss from GI  bleed/epistaxis.   Hemoglobin low EPO with HD  4. Dialysis device complication: AVF nonfunction. Currently using RIJ permcath Needs evaluation for a new access.   5. LE edema   UF goal as tolerated    LOS: 0 Stanley [name] 5/2/20198:17 AM

## 2017-07-14 ENCOUNTER — Encounter: Payer: Self-pay | Admitting: Emergency Medicine

## 2017-07-14 ENCOUNTER — Other Ambulatory Visit: Payer: Self-pay

## 2017-07-14 ENCOUNTER — Ambulatory Visit
Admission: EM | Admit: 2017-07-14 | Discharge: 2017-07-14 | Disposition: A | Discharge status: Home / Self Care | Payer: Medicare Other | Attending: Emergency Medicine | Admitting: Emergency Medicine

## 2017-07-14 DIAGNOSIS — I739 Peripheral vascular disease, unspecified: Secondary | ICD-10-CM | POA: Diagnosis not present

## 2017-07-14 DIAGNOSIS — Z0000 Encounter for general adult medical examination without abnormal findings: Secondary | ICD-10-CM | POA: Diagnosis not present

## 2017-07-14 DIAGNOSIS — I509 Heart failure, unspecified: Secondary | ICD-10-CM | POA: Diagnosis not present

## 2017-07-14 DIAGNOSIS — Z951 Presence of aortocoronary bypass graft: Secondary | ICD-10-CM | POA: Insufficient documentation

## 2017-07-14 DIAGNOSIS — Z992 Dependence on renal dialysis: Secondary | ICD-10-CM | POA: Diagnosis not present

## 2017-07-14 DIAGNOSIS — I132 Hypertensive heart and chronic kidney disease with heart failure and with stage 5 chronic kidney disease, or end stage renal disease: Secondary | ICD-10-CM | POA: Insufficient documentation

## 2017-07-14 DIAGNOSIS — Z22322 Carrier or suspected carrier of Methicillin resistant Staphylococcus aureus: Secondary | ICD-10-CM | POA: Insufficient documentation

## 2017-07-14 DIAGNOSIS — F17210 Nicotine dependence, cigarettes, uncomplicated: Secondary | ICD-10-CM | POA: Insufficient documentation

## 2017-07-14 DIAGNOSIS — N186 End stage renal disease: Secondary | ICD-10-CM | POA: Diagnosis not present

## 2017-07-14 DIAGNOSIS — D123 Benign neoplasm of transverse colon: Secondary | ICD-10-CM | POA: Insufficient documentation

## 2017-07-14 DIAGNOSIS — I2510 Atherosclerotic heart disease of native coronary artery without angina pectoris: Secondary | ICD-10-CM | POA: Diagnosis not present

## 2017-07-14 DIAGNOSIS — R600 Localized edema: Secondary | ICD-10-CM | POA: Diagnosis not present

## 2017-07-14 DIAGNOSIS — J449 Chronic obstructive pulmonary disease, unspecified: Secondary | ICD-10-CM | POA: Insufficient documentation

## 2017-07-14 DIAGNOSIS — E785 Hyperlipidemia, unspecified: Secondary | ICD-10-CM | POA: Diagnosis not present

## 2017-07-14 DIAGNOSIS — Z79899 Other long term (current) drug therapy: Secondary | ICD-10-CM | POA: Diagnosis not present

## 2017-07-14 DIAGNOSIS — D631 Anemia in chronic kidney disease: Secondary | ICD-10-CM | POA: Insufficient documentation

## 2017-07-14 DIAGNOSIS — R188 Other ascites: Secondary | ICD-10-CM | POA: Diagnosis not present

## 2017-07-14 DIAGNOSIS — I120 Hypertensive chronic kidney disease with stage 5 chronic kidney disease or end stage renal disease: Secondary | ICD-10-CM | POA: Diagnosis present

## 2017-07-14 DIAGNOSIS — Z4931 Encounter for adequacy testing for hemodialysis: Secondary | ICD-10-CM | POA: Diagnosis not present

## 2017-07-14 DIAGNOSIS — E43 Unspecified severe protein-calorie malnutrition: Secondary | ICD-10-CM | POA: Diagnosis not present

## 2017-07-14 DIAGNOSIS — E875 Hyperkalemia: Secondary | ICD-10-CM | POA: Diagnosis not present

## 2017-07-14 DIAGNOSIS — K7460 Unspecified cirrhosis of liver: Secondary | ICD-10-CM | POA: Diagnosis not present

## 2017-07-14 DIAGNOSIS — N2581 Secondary hyperparathyroidism of renal origin: Secondary | ICD-10-CM | POA: Diagnosis not present

## 2017-07-14 LAB — CBC
HCT: 25.4 % — ABNORMAL LOW (ref 40.0–52.0)
Hemoglobin: 8 g/dL — ABNORMAL LOW (ref 13.0–18.0)
MCH: 26.6 pg (ref 26.0–34.0)
MCHC: 31.5 g/dL — ABNORMAL LOW (ref 32.0–36.0)
MCV: 84.2 fL (ref 80.0–100.0)
PLATELETS: 315 10*3/uL (ref 150–440)
RBC: 3.02 MIL/uL — ABNORMAL LOW (ref 4.40–5.90)
RDW: 17.6 % — ABNORMAL HIGH (ref 11.5–14.5)
WBC: 6.4 10*3/uL (ref 3.8–10.6)

## 2017-07-14 LAB — RENAL FUNCTION PANEL
ALBUMIN: 2.1 g/dL — AB (ref 3.5–5.0)
Anion gap: 9 (ref 5–15)
BUN: 52 mg/dL — AB (ref 6–20)
CHLORIDE: 96 mmol/L — AB (ref 101–111)
CO2: 32 mmol/L (ref 22–32)
CREATININE: 7.19 mg/dL — AB (ref 0.61–1.24)
Calcium: 7.6 mg/dL — ABNORMAL LOW (ref 8.9–10.3)
GFR calc Af Amer: 9 mL/min — ABNORMAL LOW (ref >60)
GFR, EST NON AFRICAN AMERICAN: 7 mL/min — AB (ref >60)
Glucose, Bld: 119 mg/dL — ABNORMAL HIGH (ref 65–99)
Phosphorus: 5 mg/dL — ABNORMAL HIGH (ref 2.5–4.6)
Potassium: 5.9 mmol/L — ABNORMAL HIGH (ref 3.5–5.1)
Sodium: 137 mmol/L (ref 135–145)

## 2017-07-14 MED ORDER — IPRATROPIUM-ALBUTEROL 0.5-2.5 (3) MG/3ML IN SOLN
RESPIRATORY_TRACT | Status: AC
  Filled 2017-07-14: qty 3

## 2017-07-14 MED ORDER — EPOETIN ALFA 4000 UNIT/ML IJ SOLN
4000.0000 [IU] | Freq: Once | INTRAMUSCULAR | Status: AC
  Administered 2017-07-14: 4000 [IU] via INTRAVENOUS
  Filled 2017-07-14: qty 1

## 2017-07-14 MED ORDER — GABAPENTIN 100 MG PO CAPS
200.0000 mg | ORAL_CAPSULE | Freq: Once | ORAL | Status: AC
  Administered 2017-07-14: 200 mg via ORAL
  Filled 2017-07-14: qty 2

## 2017-07-14 MED ORDER — IPRATROPIUM-ALBUTEROL 0.5-2.5 (3) MG/3ML IN SOLN
3.0000 mL | Freq: Once | RESPIRATORY_TRACT | Status: AC
  Administered 2017-07-14: 3 mL via RESPIRATORY_TRACT

## 2017-07-14 NOTE — ED Triage Notes (Signed)
Arrives for scheduled dialysis.  Denies complaint.  Last dialyzed on Wednesday.  AAOx3.  Skin warm and dry. NAD

## 2017-07-14 NOTE — Progress Notes (Signed)
HD Tx started    07/14/17 1215  Vital Signs  Pulse Rate 83  Pulse Rate Source Monitor  Resp 20  BP (!) 145/81  BP Location Left Arm  BP Method Automatic  Patient Position (if appropriate) Sitting  Oxygen Therapy  SpO2 98 %  O2 Device Room Air  During Hemodialysis Assessment  Blood Flow Rate (mL/min) 400 mL/min  Arterial Pressure (mmHg) -170 mmHg  Venous Pressure (mmHg) 130 mmHg  Transmembrane Pressure (mmHg) 80 mmHg  Ultrafiltration Rate (mL/min) 1300 mL/min  Dialysate Flow Rate (mL/min) 800 ml/min  Conductivity: Machine  15.4  HD Safety Checks Performed Yes  Dialysis Fluid Bolus Normal Saline  Bolus Amount (mL) 250 mL  Intra-Hemodialysis Comments Tx initiated (Pt awake stable )

## 2017-07-14 NOTE — Progress Notes (Signed)
HD Tx ended    07/14/17 1630  Vital Signs  Pulse Rate 74  Pulse Rate Source Monitor  Resp 16  BP (!) 155/79  BP Location Left Arm  BP Method Automatic  Patient Position (if appropriate) Lying  Oxygen Therapy  SpO2 100 %  O2 Device Room Air  During Hemodialysis Assessment  HD Safety Checks Performed Yes  Dialysis Fluid Bolus Normal Saline  Bolus Amount (mL) 250 mL  Intra-Hemodialysis Comments Tx completed;Tolerated well

## 2017-07-14 NOTE — Progress Notes (Signed)
[facility]  ROUNDING NOTE   Subjective:    Patient seen at end of HD treatment  4 L of fluid was removed   Objective:  Vital signs in last 24 hours:  Temp:  [97.6 F (36.4 C)-97.7 F (36.5 C)] 97.6 F (36.4 C) (05/03 1200) Pulse Rate:  [73-93] 83 (05/03 1645) Resp:  [12-25] 14 (05/03 1545) BP: (137-158)/(72-106) 158/87 (05/03 1645) SpO2:  [90 %-100 %] 100 % (05/03 1645) Weight:  [159 lb 13.3 oz (72.5 kg)-160 lb (72.6 kg)] 159 lb 13.3 oz (72.5 kg) (05/03 1200)  Weight change:  Filed Weights   07/14/17 1133 07/14/17 1200  Weight: 160 lb (72.6 kg) 159 lb 13.3 oz (72.5 kg)    Intake/Output: No intake/output data recorded.   Intake/Output this shift:  No intake/output data recorded.  Physical Exam: General: NAD  Head: Normocephalic, atraumatic. Moist oral mucosal membranes  Eyes: Anicteric  Neck: Supple  Lungs:  normal effort, b/l mild crackles  Heart: Regular rate and rhythm  Abdomen:  Soft, nontender, distended  Extremities:  + peripheral edema.  Neurologic: Nonfocal, moving all four extremities  Skin: No lesions  Access: RUE AVF - no bruit or thrill, RIJ permcath    Basic Metabolic Panel: Recent Labs  Lab 07/10/17 1152 07/10/17 1516 07/14/17 1333  NA 136  --  137  K 7.2* 4.0 5.9*  CL 93*  --  96*  CO2 32  --  32  GLUCOSE 111*  --  119*  BUN 55*  --  52*  CREATININE 12.38*  --  7.19*  CALCIUM 8.5*  --  7.6*  PHOS 4.9*  --  5.0*    Liver Function Tests: Recent Labs  Lab 07/10/17 1152 07/14/17 1333  ALBUMIN 2.3* 2.1*   No results for input(s): LIPASE, AMYLASE in the last 168 hours. No results for input(s): AMMONIA in the last 168 hours.  CBC: Recent Labs  Lab 07/14/17 1333  WBC 6.4  HGB 8.0*  HCT 25.4*  MCV 84.2  PLT 315    Cardiac Enzymes: No results for input(s): CKTOTAL, CKMB, CKMBINDEX, TROPONINI in the last 168 hours.  BNP: Invalid input(s): POCBNP  CBG: No results for input(s): GLUCAP in the last 168  hours.  Microbiology: Results for orders placed or performed during the hospital encounter of 07/12/17  Culture, blood (Routine X 2) w Reflex to ID Panel     Status: None (Preliminary result)   Collection Time: 07/12/17  4:43 PM  Result Value Ref Range Status   Specimen Description BLOOD LINE CENTRAL  Final   Special Requests   Final    BOTTLES DRAWN AEROBIC AND ANAEROBIC Blood Culture adequate volume   Culture   Final    NO GROWTH 2 DAYS Performed at [facility], [address] [place], [place] [address]    Report Status PENDING  Incomplete    Coagulation Studies: No results for input(s): LABPROT, INR in the last 72 hours.  Urinalysis: No results for input(s): COLORURINE, LABSPEC, PHURINE, GLUCOSEU, HGBUR, BILIRUBINUR, KETONESUR, PROTEINUR, UROBILINOGEN, NITRITE, LEUKOCYTESUR in the last 72 hours.  Invalid input(s): APPERANCEUR    Imaging: No results found.   Medications:       Assessment/ Plan:  [name] is a 57 y.o. black male withend stage renal disease on hemodialysis secondary to Alport's syndrome, ascites, hypertension, anemia of chronic kidney disease, coronary artery disease, peripheral vascular disease, hyperlipidemia, gastrointestinal AVMs, pulmonary hypertension,   nooutpatientdialysis unit currently  1.  Hyperkalemia  Hemodialysis treatment with 2 K    2.  End-stage renal disease: MWF schedule.  4 L removed with HD  3. Anemia with chronic kidney disease: acute blood loss from GI bleed/epistaxis.   Hemoglobin low EPO with HD  4. Dialysis device complication: AVF nonfunction. Currently using RIJ permcath Needs evaluation for a new access. Patient to f/u with outpatient vascular office  5. LE edema   UF goal as tolerated    LOS: 0 Stanley [name] 5/3/20195:11 PM

## 2017-07-14 NOTE — ED Provider Notes (Signed)
[facility] Emergency Department Provider Note  ____________________________________________  Time seen: Approximately 3:22 PM  I have reviewed the triage vital signs and the nursing notes.   HISTORY  Chief Complaint regular dialysis   HPI Stanley [name] is a 57 y.o. male with a history of ESRD on HD (MWF) who presents for his scheduled dialysis treatment.  Patient's last dialysis was 2 days ago.  He denies shortness of breath or chest pain.  Patient has no complaints at this time.  Past Medical History:  Diagnosis Date  . Alcohol abuse   . CHF (congestive heart failure) ([place])   . Cirrhosis ([place])   . Coronary artery disease 2009  . Drug abuse ([place])   . End stage renal disease on dialysis [facility]) NEPHROLOGIST-   DR [facility]  IN [place]   HEMODIALYSIS --   TUES/  THURS/  SAT  . Gastrointestinal bleed 06/13/2017   From chart...hx of multiple GI bleeds  . GERD (gastroesophageal reflux disease)   . Hyperlipidemia   . Hypertension   . PAD (peripheral artery disease) ([place])   . Renal insufficiency    Per pt, 32 oz fluid restriction per day  . S/P triple vessel bypass 06/09/2016   2009ish  . Suicidal ideation    & HOMICIDAL IDEATION --  06-16-2013   ADMITTED TO BEHAVIOR HEALTH    Patient Active Problem List   Diagnosis Date Noted  . Protein-calorie malnutrition, severe 06/14/2017  . Encounter for dialysis [facility])   . Palliative care by specialist   . Goals of care, counseling/discussion   . Malnutrition of moderate degree 06/05/2017  . Secondary esophageal varices without bleeding ([place])   . Stomach irritation   . Idiopathic esophageal varices without bleeding ([place])   . Alcoholic hepatitis with ascites 05/24/2017  . ESRD (end stage renal disease) ([place]) 04/28/2017  . Uremia 03/08/2017  . ESRD on hemodialysis ([place]) 03/03/2017  . Weakness 02/28/2017  . Hypocalcemia 02/22/2017  . Shortness of breath 11/26/2016  . COPD (chronic obstructive  pulmonary disease) ([place]) 10/30/2016  . COPD exacerbation ([place]) 10/29/2016  . Anemia   . Heme positive stool   . Ulceration of intestine   . Benign neoplasm of transverse colon   . Acute gastrointestinal hemorrhage   . Esophageal candidiasis ([place])   . Angiodysplasia of intestinal tract   . Acute respiratory failure with hypoxia ([place]) 07/03/2016  . GI bleeding 06/24/2016  . Rectal bleeding 06/14/2016  . Anemia of chronic disease 06/01/2016  . MRSA carrier 06/01/2016  . Chronic renal failure 05/23/2016  . Ischemic heart disease 05/23/2016  . Angiodysplasia of small intestine   . Melena   . Small bowel bleed not requiring more than 4 units of blood in 24 hours, ICU, or surgery   . Anemia due to chronic blood loss   . Abdominal pain 05/05/2016  . Acute posthemorrhagic anemia 04/17/2016  . Gastrointestinal bleed 04/17/2016  . History of esophagogastroduodenoscopy (EGD) 04/17/2016  . Elevated troponin 04/17/2016  . Alcohol abuse 04/17/2016  . Upper GI bleed 01/19/2016  . Blood in stool   . Angiodysplasia of stomach and duodenum with hemorrhage   . Gastritis   . Reflux esophagitis   . GI bleed 05/16/2015  . Acute GI bleeding   . Symptomatic anemia 04/30/2015  . HTN (hypertension) 04/06/2015  . GERD (gastroesophageal reflux disease) 04/06/2015  . HLD (hyperlipidemia) 04/06/2015  . Dyspnea 04/06/2015  . Cirrhosis of liver with ascites ([place]) 04/06/2015  . Ascites 04/06/2015  .  GIB (gastrointestinal bleeding) 03/23/2015  . Homicidal ideation 06/19/2013  . Suicidal intent 06/19/2013  . Homicidal ideations 06/19/2013  . Hyperkalemia 06/16/2013  . Mandible fracture ([place]) 06/05/2013  . Fracture, mandible ([place]) 06/02/2013  . Coronary atherosclerosis of native coronary artery 06/02/2013  . ESRD on dialysis ([place]) 06/02/2013  . Mandible open fracture ([place]) 06/02/2013    Past Surgical History:  Procedure Laterality Date  . A/V FISTULAGRAM Right 06/06/2017   Procedure: A/V  FISTULAGRAM;  Surgeon: Katha Cabal, MD;  Location: [place] [facility];  Service: Cardiovascular;  Laterality: Right;  . A/V SHUNT INTERVENTION N/A 06/06/2017   Procedure: A/V SHUNT INTERVENTION;  Surgeon: Katha Cabal, MD;  Location: [place] [facility];  Service: Cardiovascular;  Laterality: N/A;  . AGILE CAPSULE N/A 06/19/2016   Procedure: AGILE CAPSULE;  Surgeon: [name], MD;  Location: [facility] ENDOSCOPY;  Service: Endoscopy;  Laterality: N/A;  . COLONOSCOPY WITH PROPOFOL N/A 06/18/2016   Procedure: COLONOSCOPY WITH PROPOFOL;  Surgeon: [name], MD;  Location: [facility] ENDOSCOPY;  Service: Endoscopy;  Laterality: N/A;  . COLONOSCOPY WITH PROPOFOL N/A 08/12/2016   Procedure: COLONOSCOPY WITH PROPOFOL;  Surgeon: [name], MD;  Location: [facility] ENDOSCOPY;  Service: Endoscopy;  Laterality: N/A;  . COLONOSCOPY WITH PROPOFOL N/A 05/05/2017   Procedure: COLONOSCOPY WITH PROPOFOL;  Surgeon: Manya Silvas, MD;  Location: [facility] ENDOSCOPY;  Service: Endoscopy;  Laterality: N/A;  . CORONARY ANGIOPLASTY  ?   PT UNABLE TO TELL IF  BEFORE OR AFTER  CABG  . CORONARY ARTERY BYPASS GRAFT  2008  ([place])   3 VESSEL  . DIALYSIS FISTULA CREATION  LAST SURGERY  APPOX  2008  . ENTEROSCOPY N/A 05/10/2016   Procedure: ENTEROSCOPY;  Surgeon: Jerene Bears, MD;  Location: [place];  Service: Gastroenterology;  Laterality: N/A;  . ENTEROSCOPY N/A 08/12/2016   Procedure: ENTEROSCOPY;  Surgeon: [name], MD;  Location: [facility] ENDOSCOPY;  Service: Endoscopy;  Laterality: N/A;  . ENTEROSCOPY Left 06/03/2017   Procedure: ENTEROSCOPY;  Surgeon: Virgel Manifold, MD;  Location: [facility] ENDOSCOPY;  Service: Endoscopy;  Laterality: Left;  Procedure date will ultimately depend on when patient is medically optimized before the procedure, pending hemodialysis and blood transfusions etc. Will place on schedule and change depending on clinical status.   . ENTEROSCOPY N/A 06/05/2017   Procedure: ENTEROSCOPY;   Surgeon: Virgel Manifold, MD;  Location: [facility] ENDOSCOPY;  Service: Endoscopy;  Laterality: N/A;  . ENTEROSCOPY N/A 06/15/2017   Procedure: Push ENTEROSCOPY;  Surgeon: [name], MD;  Location: [facility] ENDOSCOPY;  Service: Endoscopy;  Laterality: N/A;  . ESOPHAGOGASTRODUODENOSCOPY N/A 05/07/2015   Procedure: ESOPHAGOGASTRODUODENOSCOPY (EGD);  Surgeon: Hulen Luster, MD;  Location: [facility] ENDOSCOPY;  Service: Endoscopy;  Laterality: N/A;  . ESOPHAGOGASTRODUODENOSCOPY (EGD) WITH PROPOFOL N/A 05/17/2015   Procedure: ESOPHAGOGASTRODUODENOSCOPY (EGD) WITH PROPOFOL;  Surgeon: [name], MD;  Location: [facility] ENDOSCOPY;  Service: Endoscopy;  Laterality: N/A;  . ESOPHAGOGASTRODUODENOSCOPY (EGD) WITH PROPOFOL N/A 01/20/2016   Procedure: ESOPHAGOGASTRODUODENOSCOPY (EGD) WITH PROPOFOL;  Surgeon: [name], MD;  Location: [facility] ENDOSCOPY;  Service: Endoscopy;  Laterality: N/A;  . ESOPHAGOGASTRODUODENOSCOPY (EGD) WITH PROPOFOL N/A 04/17/2016   Procedure: ESOPHAGOGASTRODUODENOSCOPY (EGD) WITH PROPOFOL;  Surgeon: Lin Landsman, MD;  Location: [facility] ENDOSCOPY;  Service: Gastroenterology;  Laterality: N/A;  . ESOPHAGOGASTRODUODENOSCOPY (EGD) WITH PROPOFOL  05/09/2016   Procedure: ESOPHAGOGASTRODUODENOSCOPY (EGD) WITH PROPOFOL;  Surgeon: Jerene Bears, MD;  Location: [facility] ENDOSCOPY;  Service: Endoscopy;;  . ESOPHAGOGASTRODUODENOSCOPY (EGD) WITH PROPOFOL N/A 06/16/2016   Procedure:  ESOPHAGOGASTRODUODENOSCOPY (EGD) WITH PROPOFOL;  Surgeon: [name], MD;  Location: [facility] ENDOSCOPY;  Service: Endoscopy;  Laterality: N/A;  . ESOPHAGOGASTRODUODENOSCOPY (EGD) WITH PROPOFOL N/A 05/05/2017   Procedure: ESOPHAGOGASTRODUODENOSCOPY (EGD) WITH PROPOFOL;  Surgeon: Manya Silvas, MD;  Location: [facility] ENDOSCOPY;  Service: Endoscopy;  Laterality: N/A;  . ESOPHAGOGASTRODUODENOSCOPY (EGD) WITH PROPOFOL N/A 06/15/2017   Procedure: ESOPHAGOGASTRODUODENOSCOPY (EGD) WITH PROPOFOL;  Surgeon: [name], MD;  Location: [facility] ENDOSCOPY;  Service:  Endoscopy;  Laterality: N/A;  . GIVENS CAPSULE STUDY N/A 05/07/2016   Procedure: GIVENS CAPSULE STUDY;  Surgeon: Doran Stabler, MD;  Location: [place];  Service: Endoscopy;  Laterality: N/A;  . MANDIBULAR HARDWARE REMOVAL N/A 07/29/2013   Procedure: REMOVAL OF ARCH BARS;  Surgeon: Theodoro Kos, DO;  Location: [place];  Service: Plastics;  Laterality: N/A;  . ORIF MANDIBULAR FRACTURE N/A 06/05/2013   Procedure: REPAIR OF MANDIBULAR FRACTURE x 2 with maxillo-mandibular fixation ;  Surgeon: Theodoro Kos, DO;  Location: [place];  Service: Plastics;  Laterality: N/A;  . PERIPHERAL ARTERIAL STENT GRAFT Left     Prior to Admission medications   Medication Sig Start Date End Date Taking? Authorizing Provider  albuterol (PROVENTIL HFA;VENTOLIN HFA) 108 (90 Base) MCG/ACT inhaler Inhale 2 puffs into the lungs every 6 (six) hours as needed for wheezing or shortness of breath. 04/21/17   Lisa Roca, MD  alum & mag hydroxide-simeth (MAALOX/MYLANTA) 200-200-20 MG/5ML suspension Take 15 mLs every 4 (four) hours as needed by mouth for indigestion or heartburn. Patient not taking: Reported on 04/19/2017 01/25/17   Demetrios Loll, MD  atorvastatin (LIPITOR) 40 MG tablet Take 1 tablet (40 mg total) by mouth daily. 11/16/16   Vaughan Basta, MD  budesonide-formoterol (SYMBICORT) 160-4.5 MCG/ACT inhaler Inhale 2 puffs into the lungs daily. 11/16/16   Vaughan Basta, MD  calcium acetate (PHOSLO) 667 MG capsule Take 2,001 mg by mouth 3 (three) times daily.    [provider]  doxycycline (VIBRA-TABS) 100 MG tablet Take 1 tablet (100 mg total) by mouth 2 (two) times daily. 07/10/17   Harvest Dark, MD  ferrous sulfate 325 (65 FE) MG tablet Take 1 tablet by mouth 3 (three) times daily. 01/21/17   [provider]  furosemide (LASIX) 80 MG tablet Take 1 tablet (80 mg total) daily by mouth. 01/25/17   Demetrios Loll, MD  gabapentin (NEURONTIN) 300 MG capsule Take 1 capsule  by mouth daily as directed 08/16/16   [provider]  ipratropium-albuterol (DUONEB) 0.5-2.5 (3) MG/3ML SOLN Take 3 mLs by nebulization every 6 (six) hours as needed. 11/28/16   Loletha Grayer, MD  labetalol (NORMODYNE) 100 MG tablet Take 100 mg by mouth daily. 03/24/17   [provider]  Multiple Vitamins-Minerals-FA (DIALYVITE SUPREME D) 3 MG TABS Take 1 tablet (3 mg total) by mouth daily. 11/28/16   Loletha Grayer, MD  nitroGLYCERIN (NITROSTAT) 0.4 MG SL tablet Place 1 tablet (0.4 mg total) under the tongue every 5 (five) minutes as needed. 04/12/16   Wende Bushy, MD  pantoprazole (PROTONIX) 40 MG tablet Take 1 tablet (40 mg total) by mouth daily. 11/28/16   Loletha Grayer, MD  Spacer/Aero Chamber Mouthpiece MISC 1 Units by Does not apply route every 4 (four) hours as needed (wheezing). 02/19/17   Darel Hong, MD  tiotropium (SPIRIVA HANDIHALER) 18 MCG inhalation capsule Place 1 capsule (18 mcg total) into inhaler and inhale daily. 11/16/16 11/16/17  Vaughan Basta, MD    Allergies Patient has no known allergies.  Family  History  Problem Relation Age of Onset  . Colon cancer Mother   . Cancer Father   . Cancer Sister   . Kidney disease Brother     Social History Social History   Tobacco Use  . Smoking status: Current Every Day Smoker    Packs/day: 0.15    Years: 40.00    Pack years: 6.00    Types: Cigarettes  . Smokeless tobacco: Never Used  Substance Use Topics  . Alcohol use: No    Comment: pt reports quitting after learning about cirrhosis  . Drug use: No    Frequency: 7.0 times per week    Types: Marijuana, Cocaine    Review of Systems  Constitutional: Negative for fever. Eyes: Negative for visual changes. ENT: Negative for sore throat. Neck: No neck pain  Cardiovascular: Negative for chest pain. Respiratory: Negative for shortness of breath. Gastrointestinal: Negative for abdominal pain, vomiting or diarrhea. Genitourinary: Negative  for dysuria. Musculoskeletal: Negative for back pain.  ____________________________________________   PHYSICAL EXAM:  VITAL SIGNS: ED Triage Vitals  Enc Vitals Group     BP 07/14/17 1134 137/87     Pulse Rate 07/14/17 1134 87     Resp 07/14/17 1134 18     Temp 07/14/17 1134 97.7 F (36.5 C)     Temp Source 07/14/17 1134 Oral     SpO2 07/14/17 1134 96 %     Weight 07/14/17 1133 160 lb (72.6 kg)     Height 07/14/17 1133 6\' 3"  (1.905 m)     Head Circumference --      Peak Flow --      Pain Score 07/14/17 1133 0     Pain Loc --      Pain Edu? --      Excl. in [place]? --     Constitutional: Alert and oriented. Well appearing and in no apparent distress. HEENT:      Head: Normocephalic and atraumatic.         Eyes: Conjunctivae are normal. Sclera is non-icteric.       Mouth/Throat: Mucous membranes are moist.       Neck: Supple with no signs of meningismus. Cardiovascular: Regular rate and rhythm. No murmurs, gallops, or rubs. 2+ symmetrical distal pulses are present in all extremities. No JVD. Respiratory: Normal respiratory effort. Lungs are clear to auscultation bilaterally. No wheezes, crackles, or rhonchi.  Gastrointestinal: Soft, non tender, and non distended with positive bowel sounds. No rebound or guarding. Musculoskeletal: Nontender with normal range of motion in all extremities. No edema, cyanosis, or erythema of extremities. Neurologic: Normal speech and language. Face is symmetric. Moving all extremities. No gross focal neurologic deficits are appreciated. Skin: Skin is warm, dry and intact. No rash noted. Psychiatric: Mood and affect are normal. Speech and behavior are normal.  ____________________________________________   LABS (all labs ordered are listed, but only abnormal results are displayed)  Labs Reviewed  RENAL FUNCTION PANEL - Abnormal; Notable for the following components:      Result Value   Potassium 5.9 (*)    Chloride 96 (*)    Glucose, Bld 119  (*)    BUN 52 (*)    Creatinine, Ser 7.19 (*)    Calcium 7.6 (*)    Phosphorus 5.0 (*)    Albumin 2.1 (*)    GFR calc non Af Amer 7 (*)    GFR calc Af Amer 9 (*)    All other components within normal limits  CBC -  Abnormal; Notable for the following components:   RBC 3.02 (*)    Hemoglobin 8.0 (*)    HCT 25.4 (*)    MCHC 31.5 (*)    RDW 17.6 (*)    All other components within normal limits   ____________________________________________  EKG  none  ____________________________________________  RADIOLOGY  none  ____________________________________________   PROCEDURES  Procedure(s) performed: None Procedures Critical Care performed:  None ____________________________________________   INITIAL IMPRESSION / ASSESSMENT AND PLAN / ED COURSE  Patient is a 57 year old male who comes to the emergency room 3 times a week for scheduled dialysis.  Patient is in no respiratory distress, stable.  Discussed with Dr. Merita Norton who will dialyze patient.  Patient was taken to dialysis center from the emergency room in stable conditions.      As part of my medical decision making, I reviewed the following data within the [place] notes reviewed and incorporated, A consult was requested and obtained from this/these consultant(s) Nephrology, Notes from prior ED visits and [place] Controlled Substance Database    Pertinent labs & imaging results that were available during my care of the patient were reviewed by me and considered in my medical decision making (see chart for details).    ____________________________________________   FINAL CLINICAL IMPRESSION(S) / ED DIAGNOSES  Final diagnoses:  Encounter for dialysis [facility])      NEW MEDICATIONS STARTED DURING THIS VISIT:  ED Discharge Orders    None       Note:  This document was prepared using Dragon voice recognition software and may include unintentional dictation errors.    Alfred Levins, [place],  MD 07/14/17 1524

## 2017-07-14 NOTE — Progress Notes (Signed)
Post HD Tx    07/14/17 1645  Hand-Off documentation  Report given to (Full Name) Odis Hollingshead, RN   Report received from (Full Name) Beatris Ship, RN   Vital Signs  Temp 98.9 F (37.2 C)  Temp Source Tympanic  Pulse Rate 83  Pulse Rate Source Monitor  Resp 16  BP (!) 158/87  BP Location Left Arm  BP Method Automatic  Patient Position (if appropriate) Lying  Oxygen Therapy  SpO2 100 %  O2 Device Room Air  Dialysis Weight  Weight  (unable to weigh pt in chair)  Type of Weight Post-Dialysis  Post-Hemodialysis Assessment  Rinseback Volume (mL) 250 mL  Dialyzer Clearance Lightly streaked  Duration of HD Treatment -hour(s) 4 hour(s)  Hemodialysis Intake (mL) 500 mL  UF Total -Machine (mL) 4574 mL  Net UF (mL) 4074 mL  Tolerated HD Treatment Yes  Hemodialysis Catheter Right Internal jugular Double-lumen  No Placement Date or Time found.   Placed prior to admission: Yes  Orientation: Right  Access Location: Internal jugular  Hemodialysis Catheter Type: Double-lumen  Site Condition No complications  Blue Lumen Status Heparin locked  Red Lumen Status Heparin locked  Dressing Type Biopatch;Occlusive  Dressing Status Clean;Dry;Intact  Post treatment catheter status Capped and Clamped

## 2017-07-14 NOTE — ED Notes (Signed)
Report given to dialysis. Carlisle Cater, RN [phone]. Pt ready for dialysis and per RN transportation is on the way to pick pt up.

## 2017-07-14 NOTE — ED Notes (Signed)
Pt transported to dialysis in wheelchair. NAD at this time.

## 2017-07-14 NOTE — ED Notes (Signed)
Pt in NAd at this time and taken to transportation service after having been given and meal tray and soda.

## 2017-07-14 NOTE — Progress Notes (Signed)
Post hd assessment    07/14/17 1700  Neurological  Level of Consciousness Alert  Orientation Level Oriented X4  Respiratory  Respiratory Pattern Regular  Chest Assessment Chest expansion symmetrical  Bilateral Breath Sounds Diminished  Cough Non-productive  Cardiac  Pulse Regular  Heart Sounds S1, S2  ECG Monitor Yes  Vascular  R Radial Pulse +2  L Radial Pulse +2  Edema Generalized  Generalized Edema +2  Psychosocial  Psychosocial (WDL) WDL

## 2017-07-14 NOTE — Progress Notes (Signed)
Pre HD Tx   07/14/17 1200  Hand-Off documentation  Report given to (Full Name) Beatris Ship, RN   Vital Signs  Temp 97.6 F (36.4 C)  Temp Source Oral  Pulse Rate 83  Pulse Rate Source Monitor  Resp (!) 21  BP (!) 141/85  BP Location Left Arm  BP Method Automatic  Patient Position (if appropriate) Sitting  Oxygen Therapy  SpO2 98 %  O2 Device Room Air  Pain Assessment  Pain Scale 0-10  Pain Score 6  Pain Location Foot  Pain Intervention(s) MD notified (Comment) (MD notified will order medication )  Dialysis Weight  Weight 72.5 kg (159 lb 13.3 oz)  Type of Weight Pre-Dialysis  Time-Out for Hemodialysis  What Procedure? HD  Pt Identifiers(min of two) First/Last Name;MRN/Account#  Correct Site? Yes  Correct Side? Yes  Correct Procedure? Yes  Consents Verified? Yes  Rad Studies Available? N/A  Safety Precautions Reviewed? Yes  Engineer, civil (consulting) Number [phone]  Station Number 3  UF/Alarm Test Passed  Conductivity: Meter 14  Conductivity: Machine  14  pH 7.2  Reverse Osmosis Main  Normal Saline Lot Number [id number]  Dialyzer Lot Number [id number]  Disposable Set Lot Number [id number]  Machine Temperature 98.6 F (37 C)  Musician and Audible Yes  Blood Lines Intact and Secured Yes  Pre Treatment Patient Checks  Vascular access used during treatment Catheter  Patient is receiving dialysis in a chair Yes  Hepatitis B Surface Antigen Results Negative  Date Hepatitis B Surface Antigen Drawn 03/01/17  Hepatitis B Surface Antibody 10  Date Hepatitis B Surface Antibody Drawn 03/01/17  Hemodialysis Consent Verified Yes  Hemodialysis Standing Orders Initiated Yes  ECG (Telemetry) Monitor On Yes  Prime Ordered Normal Saline  Length of  DialysisTreatment -hour(s) 3.5 Hour(s)  Dialysis Treatment Comments Na 140  Dialyzer Elisio 17H NR  Dialysate 2K, 2.5 Ca  Dialysis Anticoagulant None  Dialysate Flow Ordered 800  Blood Flow Rate Ordered 400 mL/min   Ultrafiltration Goal 2.5 Liters  Pre Treatment Labs Renal panel;CBC  Education / Care Plan  Dialysis Education Provided Yes  Documented Education in Care Plan Yes  Hemodialysis Catheter Right Internal jugular Double-lumen  No Placement Date or Time found.   Placed prior to admission: Yes  Orientation: Right  Access Location: Internal jugular  Hemodialysis Catheter Type: Double-lumen  Site Condition No complications  Blue Lumen Status Flushed  Red Lumen Status Flushed  Purple Lumen Status N/A  Dressing Status Clean;Dry;Intact

## 2017-07-14 NOTE — ED Provider Notes (Signed)
[facility] Emergency Department Provider Note       Time seen: ----------------------------------------- 5:17 PM on 07/14/2017 -----------------------------------------   I have reviewed the triage vital signs and the nursing notes.  HISTORY   Chief Complaint regular dialysis    HPI Stanley [name] is a 57 y.o. male with a history of Alcohol abuse, CHF, cirrhosis, coronary artery disease, drug abuse, dialysis Tuesday Thursday and Saturday who presents to the ED for reassessment after having dialysis today. Patient states he feels well, no complaints at this time.   Past Medical History:  Diagnosis Date  . Alcohol abuse   . CHF (congestive heart failure) ([place])   . Cirrhosis ([place])   . Coronary artery disease 2009  . Drug abuse ([place])   . End stage renal disease on dialysis [facility]) NEPHROLOGIST-   DR [facility]  IN [place]   HEMODIALYSIS --   TUES/  THURS/  SAT  . Gastrointestinal bleed 06/13/2017   From chart...hx of multiple GI bleeds  . GERD (gastroesophageal reflux disease)   . Hyperlipidemia   . Hypertension   . PAD (peripheral artery disease) ([place])   . Renal insufficiency    Per pt, 32 oz fluid restriction per day  . S/P triple vessel bypass 06/09/2016   2009ish  . Suicidal ideation    & HOMICIDAL IDEATION --  06-16-2013   ADMITTED TO BEHAVIOR HEALTH    Patient Active Problem List   Diagnosis Date Noted  . Protein-calorie malnutrition, severe 06/14/2017  . Encounter for dialysis [facility])   . Palliative care by specialist   . Goals of care, counseling/discussion   . Malnutrition of moderate degree 06/05/2017  . Secondary esophageal varices without bleeding ([place])   . Stomach irritation   . Idiopathic esophageal varices without bleeding ([place])   . Alcoholic hepatitis with ascites 05/24/2017  . ESRD (end stage renal disease) ([place]) 04/28/2017  . Uremia 03/08/2017  . ESRD on hemodialysis ([place]) 03/03/2017  . Weakness 02/28/2017  . Hypocalcemia  02/22/2017  . Shortness of breath 11/26/2016  . COPD (chronic obstructive pulmonary disease) ([place]) 10/30/2016  . COPD exacerbation ([place]) 10/29/2016  . Anemia   . Heme positive stool   . Ulceration of intestine   . Benign neoplasm of transverse colon   . Acute gastrointestinal hemorrhage   . Esophageal candidiasis ([place])   . Angiodysplasia of intestinal tract   . Acute respiratory failure with hypoxia ([place]) 07/03/2016  . GI bleeding 06/24/2016  . Rectal bleeding 06/14/2016  . Anemia of chronic disease 06/01/2016  . MRSA carrier 06/01/2016  . Chronic renal failure 05/23/2016  . Ischemic heart disease 05/23/2016  . Angiodysplasia of small intestine   . Melena   . Small bowel bleed not requiring more than 4 units of blood in 24 hours, ICU, or surgery   . Anemia due to chronic blood loss   . Abdominal pain 05/05/2016  . Acute posthemorrhagic anemia 04/17/2016  . Gastrointestinal bleed 04/17/2016  . History of esophagogastroduodenoscopy (EGD) 04/17/2016  . Elevated troponin 04/17/2016  . Alcohol abuse 04/17/2016  . Upper GI bleed 01/19/2016  . Blood in stool   . Angiodysplasia of stomach and duodenum with hemorrhage   . Gastritis   . Reflux esophagitis   . GI bleed 05/16/2015  . Acute GI bleeding   . Symptomatic anemia 04/30/2015  . HTN (hypertension) 04/06/2015  . GERD (gastroesophageal reflux disease) 04/06/2015  . HLD (hyperlipidemia) 04/06/2015  . Dyspnea 04/06/2015  . Cirrhosis of liver with ascites (  [place]) 04/06/2015  . Ascites 04/06/2015  . GIB (gastrointestinal bleeding) 03/23/2015  . Homicidal ideation 06/19/2013  . Suicidal intent 06/19/2013  . Homicidal ideations 06/19/2013  . Hyperkalemia 06/16/2013  . Mandible fracture ([place]) 06/05/2013  . Fracture, mandible ([place]) 06/02/2013  . Coronary atherosclerosis of native coronary artery 06/02/2013  . ESRD on dialysis ([place]) 06/02/2013  . Mandible open fracture ([place]) 06/02/2013    Past Surgical History:  Procedure  Laterality Date  . A/V FISTULAGRAM Right 06/06/2017   Procedure: A/V FISTULAGRAM;  Surgeon: Katha Cabal, MD;  Location: [place] CV [facility];  Service: Cardiovascular;  Laterality: Right;  . A/V SHUNT INTERVENTION N/A 06/06/2017   Procedure: A/V SHUNT INTERVENTION;  Surgeon: Katha Cabal, MD;  Location: [place] [facility];  Service: Cardiovascular;  Laterality: N/A;  . AGILE CAPSULE N/A 06/19/2016   Procedure: AGILE CAPSULE;  Surgeon: [name], MD;  Location: [facility] ENDOSCOPY;  Service: Endoscopy;  Laterality: N/A;  . COLONOSCOPY WITH PROPOFOL N/A 06/18/2016   Procedure: COLONOSCOPY WITH PROPOFOL;  Surgeon: [name], MD;  Location: [facility] ENDOSCOPY;  Service: Endoscopy;  Laterality: N/A;  . COLONOSCOPY WITH PROPOFOL N/A 08/12/2016   Procedure: COLONOSCOPY WITH PROPOFOL;  Surgeon: [name], MD;  Location[facility] ENDOSCOPY;  Service: Endoscopy;  Laterality: N/A;  . COLONOSCOPY WITH PROPOFOL N/A 05/05/2017   Procedure: COLONOSCOPY WITH PROPOFOL;  Surgeon: Manya Silvas, MD;  Location[facility]COPY;  Service: Endoscopy;  Laterality: N/A;  . CORONARY ANGIOPLASTY  ?   PT UNABLE TO TELL IF  BEFORE OR AFTER  CABG  . CORONARY ARTERY BYPASS GRAFT  2008  ([place] , [place])   3 VESSEL  . DIALYSIS FISTULA CREATION  LAST SURGERY  APPOX  2008  . ENTEROSCOPY N/A 05/10/2016   Procedure: ENTEROSCOPY;  Surgeon: Jerene Bears, MD;  Location: [place];  Service: Gastroenterology;  Laterality: N/A;  . ENTEROSCOPY N/A 08/12/2016   Procedure: ENTEROSCOPY;  Surgeon: [name], MD;  Location: [facility] ENDOSCOPY;  Service: Endoscopy;  Laterality: N/A;  . ENTEROSCOPY Left 06/03/2017   Procedure: ENTEROSCOPY;  Surgeon: Virgel Manifold, MD;  Location: [facility] ENDOSCOPY;  Service: Endoscopy;  Laterality: Left;  Procedure date will ultimately depend on when patient is medically optimized before the procedure, pending hemodialysis and blood transfusions etc. Will place on schedule and change depending on clinical  status.   . ENTEROSCOPY N/A 06/05/2017   Procedure: ENTEROSCOPY;  Surgeon: Virgel Manifold, MD;  Location: [facility] ENDOSCOPY;  Service: Endoscopy;  Laterality: N/A;  . ENTEROSCOPY N/A 06/15/2017   Procedure: Push ENTEROSCOPY;  Surgeon: [name], MD;  Location: [facility] ENDOSCOPY;  Service: Endoscopy;  Laterality: N/A;  . ESOPHAGOGASTRODUODENOSCOPY N/A 05/07/2015   Procedure: ESOPHAGOGASTRODUODENOSCOPY (EGD);  Surgeon: Hulen Luster, MD;  Location: [facility] ENDOSCOPY;  Service: Endoscopy;  Laterality: N/A;  . ESOPHAGOGASTRODUODENOSCOPY (EGD) WITH PROPOFOL N/A 05/17/2015   Procedure: ESOPHAGOGASTRODUODENOSCOPY (EGD) WITH PROPOFOL;  Surgeon: [name], MD;  Location: [facility] ENDOSCOPY;  Service: Endoscopy;  Laterality: N/A;  . ESOPHAGOGASTRODUODENOSCOPY (EGD) WITH PROPOFOL N/A 01/20/2016   Procedure: ESOPHAGOGASTRODUODENOSCOPY (EGD) WITH PROPOFOL;  Surgeon: [name], MD;  Location: [facility] ENDOSCOPY;  Service: Endoscopy;  Laterality: N/A;  . ESOPHAGOGASTRODUODENOSCOPY (EGD) WITH PROPOFOL N/A 04/17/2016   Procedure: ESOPHAGOGASTRODUODENOSCOPY (EGD) WITH PROPOFOL;  Surgeon: Lin Landsman, MD;  Location: [facility] ENDOSCOPY;  Service: Gastroenterology;  Laterality: N/A;  . ESOPHAGOGASTRODUODENOSCOPY (EGD) WITH PROPOFOL  05/09/2016   Procedure: ESOPHAGOGASTRODUODENOSCOPY (EGD) WITH PROPOFOL;  Surgeon: Jerene Bears, MD;  Location[facility] END[facility];  Service: Endoscopy;;  . ESOPHAGOGASTRODUODENOSCOPY (  EGD) WITH PROPOFOL N/A 06/16/2016   Procedure: ESOPHAGOGASTRODUODENOSCOPY (EGD) WITH PROPOFOL;  Surgeon: [name], MD;  Location: [facility] ENDOSCOPY;  Service: Endoscopy;  Laterality: N/A;  . ESOPHAGOGASTRODUODENOSCOPY (EGD) WITH PROPOFOL N/A 05/05/2017   Procedure: ESOPHAGOGASTRODUODENOSCOPY (EGD) WITH PROPOFOL;  Surgeon: Manya Silvas, MD;  Location: [facility] ENDOSCOPY;  Service: Endoscopy;  Laterality: N/A;  . ESOPHAGOGASTRODUODENOSCOPY (EGD) WITH PROPOFOL N/A 06/15/2017   Procedure: ESOPHAGOGASTRODUODENOSCOPY (EGD) WITH PROPOFOL;   Surgeon: [name], MD;  Location: [facility] ENDOSCOPY;  Service: Endoscopy;  Laterality: N/A;  . GIVENS CAPSULE STUDY N/A 05/07/2016   Procedure: GIVENS CAPSULE STUDY;  Surgeon: Doran Stabler, MD;  Location: [place];  Service: Endoscopy;  Laterality: N/A;  . MANDIBULAR HARDWARE REMOVAL N/A 07/29/2013   Procedure: REMOVAL OF ARCH BARS;  Surgeon: Theodoro Kos, DO;  Location: [place];  Service: Plastics;  Laterality: N/A;  . ORIF MANDIBULAR FRACTURE N/A 06/05/2013   Procedure: REPAIR OF MANDIBULAR FRACTURE x 2 with maxillo-mandibular fixation ;  Surgeon: Theodoro Kos, DO;  Location: [place];  Service: Plastics;  Laterality: N/A;  . PERIPHERAL ARTERIAL STENT GRAFT Left     Allergies Patient has no known allergies.  Social History Social History   Tobacco Use  . Smoking status: Current Every Day Smoker    Packs/day: 0.15    Years: 40.00    Pack years: 6.00    Types: Cigarettes  . Smokeless tobacco: Never Used  Substance Use Topics  . Alcohol use: No    Comment: pt reports quitting after learning about cirrhosis  . Drug use: No    Frequency: 7.0 times per week    Types: Marijuana, Cocaine   Review of Systems Constitutional: Negative for fever. Cardiovascular: Negative for chest pain. Respiratory: Negative for shortness of breath. Gastrointestinal: Negative for abdominal pain, vomiting and diarrhea. Musculoskeletal: Negative for back pain. Skin: Negative for rash. Neurological: Negative for headaches, focal weakness or numbness.  All systems negative/normal/unremarkable except as stated in the HPI  ____________________________________________   PHYSICAL EXAM:  VITAL SIGNS: ED Triage Vitals  Enc Vitals Group     BP 07/14/17 1134 137/87     Pulse Rate 07/14/17 1134 87     Resp 07/14/17 1134 18     Temp 07/14/17 1134 97.7 F (36.5 C)     Temp Source 07/14/17 1134 Oral     SpO2 07/14/17 1134 96 %     Weight 07/14/17 1133 160 lb (72.6 kg)      Height 07/14/17 1133 6\' 3"  (1.905 m)     Head Circumference --      Peak Flow --      Pain Score 07/14/17 1133 0     Pain Loc --      Pain Edu? --      Excl. in [place]? --    Constitutional: Alert and oriented. Well appearing and in no distress. Eyes: Conjunctivae are normal. Normal extraocular movements. Cardiovascular: Normal rate, regular rhythm. Respiratory: Normal respiratory effort without tachypnea nor retractions. Breath sounds are clear and equal bilaterally. No wheezes/rales/rhonchi. Gastrointestinal: Soft and nontender. Normal bowel sounds Musculoskeletal: Nontender with normal range of motion in extremities. No lower extremity tenderness nor edema. Neurologic:  Normal speech and language. No gross focal neurologic deficits are appreciated.  Skin:  Skin is warm, dry and intact. No rash noted. Psychiatric: Mood and affect are normal. Speech and behavior are normal.  ____________________________________________  ED COURSE:  As part of my medical decision making, I reviewed the following data  within the [place] History obtained from family if available, nursing notes, old chart and ekg, as well as notes from prior ED visits. Patient presented for evaluation after dialysis, we will assess with labs and imaging as indicated at this time.   Procedures ____________________________________________   LABS (pertinent positives/negatives)  Labs Reviewed  RENAL FUNCTION PANEL - Abnormal; Notable for the following components:      Result Value   Potassium 5.9 (*)    Chloride 96 (*)    Glucose, Bld 119 (*)    BUN 52 (*)    Creatinine, Ser 7.19 (*)    Calcium 7.6 (*)    Phosphorus 5.0 (*)    Albumin 2.1 (*)    GFR calc non Af Amer 7 (*)    GFR calc Af Amer 9 (*)    All other components within normal limits  CBC - Abnormal; Notable for the following components:   RBC 3.02 (*)    Hemoglobin 8.0 (*)    HCT 25.4 (*)    MCHC 31.5 (*)    RDW 17.6 (*)    All  other components within normal limits   FINAL ASSESSMENT AND PLAN  Medical Screening Examination   Plan: The patient had presented for Reassessment after dialysis. He has no complaints and is stable for outpatient follow up.    Laurence Aly, MD   Note: This note was generated in part or whole with voice recognition software. Voice recognition is usually quite accurate but there are transcription errors that can and very often do occur. I apologize for any typographical errors that were not detected and corrected.     Earleen Newport, MD 07/14/17 [phone]

## 2017-07-17 ENCOUNTER — Emergency Department
Admission: EM | Admit: 2017-07-17 | Discharge: 2017-07-17 | Disposition: A | Discharge status: Home / Self Care | Payer: Medicare Other | Attending: Emergency Medicine | Admitting: Emergency Medicine

## 2017-07-17 ENCOUNTER — Encounter: Payer: Self-pay | Admitting: Emergency Medicine

## 2017-07-17 ENCOUNTER — Other Ambulatory Visit: Payer: Self-pay

## 2017-07-17 ENCOUNTER — Emergency Department: Payer: Medicare Other

## 2017-07-17 DIAGNOSIS — I509 Heart failure, unspecified: Secondary | ICD-10-CM | POA: Diagnosis not present

## 2017-07-17 DIAGNOSIS — R140 Abdominal distension (gaseous): Secondary | ICD-10-CM | POA: Insufficient documentation

## 2017-07-17 DIAGNOSIS — I132 Hypertensive heart and chronic kidney disease with heart failure and with stage 5 chronic kidney disease, or end stage renal disease: Secondary | ICD-10-CM | POA: Diagnosis not present

## 2017-07-17 DIAGNOSIS — Z79899 Other long term (current) drug therapy: Secondary | ICD-10-CM | POA: Diagnosis not present

## 2017-07-17 DIAGNOSIS — F17210 Nicotine dependence, cigarettes, uncomplicated: Secondary | ICD-10-CM | POA: Insufficient documentation

## 2017-07-17 DIAGNOSIS — J449 Chronic obstructive pulmonary disease, unspecified: Secondary | ICD-10-CM | POA: Insufficient documentation

## 2017-07-17 DIAGNOSIS — R188 Other ascites: Secondary | ICD-10-CM | POA: Diagnosis not present

## 2017-07-17 DIAGNOSIS — Z9889 Other specified postprocedural states: Secondary | ICD-10-CM | POA: Diagnosis not present

## 2017-07-17 DIAGNOSIS — N186 End stage renal disease: Secondary | ICD-10-CM | POA: Diagnosis not present

## 2017-07-17 DIAGNOSIS — I2510 Atherosclerotic heart disease of native coronary artery without angina pectoris: Secondary | ICD-10-CM | POA: Insufficient documentation

## 2017-07-17 DIAGNOSIS — Z992 Dependence on renal dialysis: Secondary | ICD-10-CM | POA: Diagnosis not present

## 2017-07-17 DIAGNOSIS — R1907 Generalized intra-abdominal and pelvic swelling, mass and lump: Secondary | ICD-10-CM | POA: Diagnosis present

## 2017-07-17 LAB — COMPREHENSIVE METABOLIC PANEL
ALBUMIN: 2.5 g/dL — AB (ref 3.5–5.0)
ALT: 46 U/L (ref 17–63)
AST: 45 U/L — AB (ref 15–41)
Alkaline Phosphatase: 238 U/L — ABNORMAL HIGH (ref 38–126)
Anion gap: 12 (ref 5–15)
BILIRUBIN TOTAL: 0.5 mg/dL (ref 0.3–1.2)
BUN: 96 mg/dL — AB (ref 6–20)
CALCIUM: 8.7 mg/dL — AB (ref 8.9–10.3)
CO2: 26 mmol/L (ref 22–32)
CREATININE: 7.07 mg/dL — AB (ref 0.61–1.24)
Chloride: 97 mmol/L — ABNORMAL LOW (ref 101–111)
GFR calc Af Amer: 9 mL/min — ABNORMAL LOW (ref >60)
GFR calc non Af Amer: 8 mL/min — ABNORMAL LOW (ref >60)
Glucose, Bld: 85 mg/dL (ref 65–99)
Potassium: 7 mmol/L (ref 3.5–5.1)
SODIUM: 135 mmol/L (ref 135–145)
TOTAL PROTEIN: 7.1 g/dL (ref 6.5–8.1)

## 2017-07-17 LAB — CBC
HEMATOCRIT: 25.1 % — AB (ref 40.0–52.0)
HEMOGLOBIN: 7.7 g/dL — AB (ref 13.0–18.0)
MCH: 26 pg (ref 26.0–34.0)
MCHC: 30.8 g/dL — AB (ref 32.0–36.0)
MCV: 84.3 fL (ref 80.0–100.0)
Platelets: 311 10*3/uL (ref 150–440)
RBC: 2.98 MIL/uL — AB (ref 4.40–5.90)
RDW: 17.5 % — ABNORMAL HIGH (ref 11.5–14.5)
WBC: 7.6 10*3/uL (ref 3.8–10.6)

## 2017-07-17 LAB — CULTURE, BLOOD (ROUTINE X 2)
CULTURE: NO GROWTH
SPECIAL REQUESTS: ADEQUATE

## 2017-07-17 LAB — PHOSPHORUS: PHOSPHORUS: 4.6 mg/dL (ref 2.5–4.6)

## 2017-07-17 MED ORDER — PENTAFLUOROPROP-TETRAFLUOROETH EX AERO: 1.0000 "application " | INHALATION_SPRAY | CUTANEOUS | Status: DC | PRN

## 2017-07-17 MED ORDER — HEPARIN SODIUM (PORCINE) 1000 UNIT/ML DIALYSIS
1000.0000 [IU] | INTRAMUSCULAR | Status: DC | PRN
  Filled 2017-07-17: qty 1

## 2017-07-17 MED ORDER — LIDOCAINE-PRILOCAINE 2.5-2.5 % EX CREA: 1.0000 "application " | TOPICAL_CREAM | CUTANEOUS | Status: DC | PRN

## 2017-07-17 MED ORDER — SODIUM CHLORIDE 0.9 % IV SOLN: 100.0000 mL | INTRAVENOUS | Status: DC | PRN

## 2017-07-17 MED ORDER — DIPHENHYDRAMINE HCL 50 MG/ML IJ SOLN
25.0000 mg | Freq: Once | INTRAMUSCULAR | Status: AC
Start: 2017-07-17 — End: 2017-07-17
  Administered 2017-07-17: 25 mg via INTRAVENOUS

## 2017-07-17 MED ORDER — ALBUMIN HUMAN 25 % IV SOLN
50.0000 g | Freq: Once | INTRAVENOUS | Status: AC
  Administered 2017-07-17: 50 g via INTRAVENOUS
  Filled 2017-07-17 (×2): qty 200

## 2017-07-17 MED ORDER — ALTEPLASE 2 MG IJ SOLR
2.0000 mg | Freq: Once | INTRAMUSCULAR | Status: DC | PRN
  Filled 2017-07-17: qty 2

## 2017-07-17 MED ORDER — LIDOCAINE HCL (PF) 1 % IJ SOLN: 5.0000 mL | INTRAMUSCULAR | Status: DC | PRN

## 2017-07-17 NOTE — ED Provider Notes (Signed)
[facility] Emergency Department Provider Note  Time seen: 11:49 AM  I have reviewed the triage vital signs and the nursing notes.   HISTORY  Chief Complaint DIALYSIS (here for diaylsis)    HPI Stanley [name] is a 57 y.o. male with a past medical history of alcohol abuse, CHF, cirrhosis, substance use, hypertension, end-stage renal disease on hemodialysis Monday, Wednesday, Friday, presents to the emergency department for abdominal swelling and dialysis.  According to the patient he is on dialysis Monday, Wednesday, Friday, due for his routine scheduled dialysis today which she is currently receiving through the emergency department.  Patient also states it has been several weeks since he has had his paracentesis performed he is having significant abdominal distention which is causing him shortness of breath.  Denies any acute abdominal pain besides tightness, denies any fever.  Patient is requesting his paracentesis performed today as well.  Per record review last paracentesis appears to be 06/30/2017.   Past Medical History:  Diagnosis Date  . Alcohol abuse   . CHF (congestive heart failure) ([place])   . Cirrhosis ([place])   . Coronary artery disease 2009  . Drug abuse ([place])   . End stage renal disease on dialysis [facility]) NEPHROLOGIST-   [facility]  IN [place]   HEMODIALYSIS --   TUES/  THURS/  SAT  . Gastrointestinal bleed 06/13/2017   From chart...hx of multiple GI bleeds  . GERD (gastroesophageal reflux disease)   . Hyperlipidemia   . Hypertension   . PAD (peripheral artery disease) ([place])   . Renal insufficiency    Per pt, 32 oz fluid restriction per day  . S/P triple vessel bypass 06/09/2016   2009ish  . Suicidal ideation    & HOMICIDAL IDEATION --  06-16-2013   ADMITTED TO BEHAVIOR HEALTH    Patient Active Problem List   Diagnosis Date Noted  . Protein-calorie malnutrition, severe 06/14/2017  . Encounter for dialysis [facility])   . Palliative care by  specialist   . Goals of care, counseling/discussion   . Malnutrition of moderate degree 06/05/2017  . Secondary esophageal varices without bleeding ([place])   . Stomach irritation   . Idiopathic esophageal varices without bleeding ([place])   . Alcoholic hepatitis with ascites 05/24/2017  . ESRD (end stage renal disease) ([place]) 04/28/2017  . Uremia 03/08/2017  . ESRD on hemodialysis ([place]) 03/03/2017  . Weakness 02/28/2017  . Hypocalcemia 02/22/2017  . Shortness of breath 11/26/2016  . COPD (chronic obstructive pulmonary disease) ([place]) 10/30/2016  . COPD exacerbation ([place]) 10/29/2016  . Anemia   . Heme positive stool   . Ulceration of intestine   . Benign neoplasm of transverse colon   . Acute gastrointestinal hemorrhage   . Esophageal candidiasis ([place])   . Angiodysplasia of intestinal tract   . Acute respiratory failure with hypoxia ([place]) 07/03/2016  . GI bleeding 06/24/2016  . Rectal bleeding 06/14/2016  . Anemia of chronic disease 06/01/2016  . MRSA carrier 06/01/2016  . Chronic renal failure 05/23/2016  . Ischemic heart disease 05/23/2016  . Angiodysplasia of small intestine   . Melena   . Small bowel bleed not requiring more than 4 units of blood in 24 hours, ICU, or surgery   . Anemia due to chronic blood loss   . Abdominal pain 05/05/2016  . Acute posthemorrhagic anemia 04/17/2016  . Gastrointestinal bleed 04/17/2016  . History of esophagogastroduodenoscopy (EGD) 04/17/2016  . Elevated troponin 04/17/2016  . Alcohol abuse 04/17/2016  . Upper  GI bleed 01/19/2016  . Blood in stool   . Angiodysplasia of stomach and duodenum with hemorrhage   . Gastritis   . Reflux esophagitis   . GI bleed 05/16/2015  . Acute GI bleeding   . Symptomatic anemia 04/30/2015  . HTN (hypertension) 04/06/2015  . GERD (gastroesophageal reflux disease) 04/06/2015  . HLD (hyperlipidemia) 04/06/2015  . Dyspnea 04/06/2015  . Cirrhosis of liver with ascites ([place]) 04/06/2015  . Ascites 04/06/2015  .  GIB (gastrointestinal bleeding) 03/23/2015  . Homicidal ideation 06/19/2013  . Suicidal intent 06/19/2013  . Homicidal ideations 06/19/2013  . Hyperkalemia 06/16/2013  . Mandible fracture ([place]) 06/05/2013  . Fracture, mandible ([place]) 06/02/2013  . Coronary atherosclerosis of native coronary artery 06/02/2013  . ESRD on dialysis ([place]) 06/02/2013  . Mandible open fracture ([place]) 06/02/2013    Past Surgical History:  Procedure Laterality Date  . A/V FISTULAGRAM Right 06/06/2017   Procedure: A/V FISTULAGRAM;  Surgeon: Katha Cabal, MD;  Location: [place] CV LAB;  Service: Cardiovascular;  Laterality: Right;  . A/V SHUNT INTERVENTION N/A 06/06/2017   Procedure: A/V SHUNT INTERVENTION;  Surgeon: Katha Cabal, MD;  Location: [place] CV LAB;  Service: Cardiovascular;  Laterality: N/A;  . AGILE CAPSULE N/A 06/19/2016   Procedure: AGILE CAPSULE;  Surgeon: [name], MD;  Location: [facility] ENDOSCOPY;  Service: Endoscopy;  Laterality: N/A;  . COLONOSCOPY WITH PROPOFOL N/A 06/18/2016   Procedure: COLONOSCOPY WITH PROPOFOL;  Surgeon: [name], MD;  Location: [facility] ENDOSCOPY;  Service: Endoscopy;  Laterality: N/A;  . COLONOSCOPY WITH PROPOFOL N/A 08/12/2016   Procedure: COLONOSCOPY WITH PROPOFOL;  Surgeon: [name], MD;  Location: [facility] ENDOSCOPY;  Service: Endoscopy;  Laterality: N/A;  . COLONOSCOPY WITH PROPOFOL N/A 05/05/2017   Procedure: COLONOSCOPY WITH PROPOFOL;  Surgeon: Manya Silvas, MD;  Location: [facility] ENDOSCOPY;  Service: Endoscopy;  Laterality: N/A;  . CORONARY ANGIOPLASTY  ?   PT UNABLE TO TELL IF  BEFORE OR AFTER  CABG  . CORONARY ARTERY BYPASS GRAFT  2008  ([place])   3 VESSEL  . DIALYSIS FISTULA CREATION  LAST SURGERY  APPOX  2008  . ENTEROSCOPY N/A 05/10/2016   Procedure: ENTEROSCOPY;  Surgeon: Jerene Bears, MD;  Location: [place];  Service: Gastroenterology;  Laterality: N/A;  . ENTEROSCOPY N/A 08/12/2016   Procedure: ENTEROSCOPY;  Surgeon: [name], MD;  Location: [facility] ENDOSCOPY;  Service: Endoscopy;  Laterality: N/A;  . ENTEROSCOPY Left 06/03/2017   Procedure: ENTEROSCOPY;  Surgeon: Virgel Manifold, MD;  Location: [facility] ENDOSCOPY;  Service: Endoscopy;  Laterality: Left;  Procedure date will ultimately depend on when patient is medically optimized before the procedure, pending hemodialysis and blood transfusions etc. Will place on schedule and change depending on clinical status.   . ENTEROSCOPY N/A 06/05/2017   Procedure: ENTEROSCOPY;  Surgeon: Virgel Manifold, MD;  Location: [facility] ENDOSCOPY;  Service: Endoscopy;  Laterality: N/A;  . ENTEROSCOPY N/A 06/15/2017   Procedure: Push ENTEROSCOPY;  Surgeon: [name], MD;  Location: [facility] ENDOSCOPY;  Service: Endoscopy;  Laterality: N/A;  . ESOPHAGOGASTRODUODENOSCOPY N/A 05/07/2015   Procedure: ESOPHAGOGASTRODUODENOSCOPY (EGD);  Surgeon: Hulen Luster, MD;  Location: [facility] ENDOSCOPY;  Service: Endoscopy;  Laterality: N/A;  . ESOPHAGOGASTRODUODENOSCOPY (EGD) WITH PROPOFOL N/A 05/17/2015   Procedure: ESOPHAGOGASTRODUODENOSCOPY (EGD) WITH PROPOFOL;  Surgeon: [name], MD;  Location: [facility] ENDOSCOPY;  Service: Endoscopy;  Laterality: N/A;  . ESOPHAGOGASTRODUODENOSCOPY (EGD) WITH PROPOFOL N/A 01/20/2016   Procedure: ESOPHAGOGASTRODUODENOSCOPY (EGD) WITH PROPOFOL;  Surgeon: [name], MD;  Location: [facility] ENDOSCOPY;  Service: Endoscopy;  Laterality: N/A;  . ESOPHAGOGASTRODUODENOSCOPY (EGD) WITH PROPOFOL N/A 04/17/2016   Procedure: ESOPHAGOGASTRODUODENOSCOPY (EGD) WITH PROPOFOL;  Surgeon: Lin Landsman, MD;  Location: [facility] ENDOSCOPY;  Service: Gastroenterology;  Laterality: N/A;  . ESOPHAGOGASTRODUODENOSCOPY (EGD) WITH PROPOFOL  05/09/2016   Procedure: ESOPHAGOGASTRODUODENOSCOPY (EGD) WITH PROPOFOL;  Surgeon: Jerene Bears, MD;  Location: [place];  Service: Endoscopy;;  . ESOPHAGOGASTRODUODENOSCOPY (EGD) WITH PROPOFOL N/A 06/16/2016   Procedure: ESOPHAGOGASTRODUODENOSCOPY (EGD) WITH PROPOFOL;   Surgeon: [name], MD;  Location: [facility] ENDOSCOPY;  Service: Endoscopy;  Laterality: N/A;  . ESOPHAGOGASTRODUODENOSCOPY (EGD) WITH PROPOFOL N/A 05/05/2017   Procedure: ESOPHAGOGASTRODUODENOSCOPY (EGD) WITH PROPOFOL;  Surgeon: Manya Silvas, MD;  Location: [facility] ENDOSCOPY;  Service: Endoscopy;  Laterality: N/A;  . ESOPHAGOGASTRODUODENOSCOPY (EGD) WITH PROPOFOL N/A 06/15/2017   Procedure: ESOPHAGOGASTRODUODENOSCOPY (EGD) WITH PROPOFOL;  Surgeon: [name], MD;  Location: [facility] ENDOSCOPY;  Service: Endoscopy;  Laterality: N/A;  . GIVENS CAPSULE STUDY N/A 05/07/2016   Procedure: GIVENS CAPSULE STUDY;  Surgeon: Doran Stabler, MD;  Location: [place];  Service: Endoscopy;  Laterality: N/A;  . MANDIBULAR HARDWARE REMOVAL N/A 07/29/2013   Procedure: REMOVAL OF ARCH BARS;  Surgeon: Theodoro Kos, DO;  Location: [place];  Service: Plastics;  Laterality: N/A;  . ORIF MANDIBULAR FRACTURE N/A 06/05/2013   Procedure: REPAIR OF MANDIBULAR FRACTURE x 2 with maxillo-mandibular fixation ;  Surgeon: Theodoro Kos, DO;  Location: [place];  Service: Plastics;  Laterality: N/A;  . PERIPHERAL ARTERIAL STENT GRAFT Left     Prior to Admission medications   Medication Sig Start Date End Date Taking? Authorizing Provider  albuterol (PROVENTIL HFA;VENTOLIN HFA) 108 (90 Base) MCG/ACT inhaler Inhale 2 puffs into the lungs every 6 (six) hours as needed for wheezing or shortness of breath. 04/21/17   Lisa Roca, MD  alum & mag hydroxide-simeth (MAALOX/MYLANTA) 200-200-20 MG/5ML suspension Take 15 mLs every 4 (four) hours as needed by mouth for indigestion or heartburn. Patient not taking: Reported on 04/19/2017 01/25/17   Demetrios Loll, MD  atorvastatin (LIPITOR) 40 MG tablet Take 1 tablet (40 mg total) by mouth daily. 11/16/16   Vaughan Basta, MD  budesonide-formoterol (SYMBICORT) 160-4.5 MCG/ACT inhaler Inhale 2 puffs into the lungs daily. 11/16/16   Vaughan Basta, MD  calcium acetate  (PHOSLO) 667 MG capsule Take 2,001 mg by mouth 3 (three) times daily.    [provider]  doxycycline (VIBRA-TABS) 100 MG tablet Take 1 tablet (100 mg total) by mouth 2 (two) times daily. 07/10/17   Harvest Dark, MD  ferrous sulfate 325 (65 FE) MG tablet Take 1 tablet by mouth 3 (three) times daily. 01/21/17   [provider]  furosemide (LASIX) 80 MG tablet Take 1 tablet (80 mg total) daily by mouth. 01/25/17   Demetrios Loll, MD  gabapentin (NEURONTIN) 300 MG capsule Take 1 capsule by mouth daily as directed 08/16/16   [provider]  ipratropium-albuterol (DUONEB) 0.5-2.5 (3) MG/3ML SOLN Take 3 mLs by nebulization every 6 (six) hours as needed. 11/28/16   Loletha Grayer, MD  labetalol (NORMODYNE) 100 MG tablet Take 100 mg by mouth daily. 03/24/17   [provider]  Multiple Vitamins-Minerals-FA (DIALYVITE SUPREME D) 3 MG TABS Take 1 tablet (3 mg total) by mouth daily. 11/28/16   Loletha Grayer, MD  nitroGLYCERIN (NITROSTAT) 0.4 MG SL tablet Place 1 tablet (0.4 mg total) under the tongue every 5 (five) minutes as needed. 04/12/16   Wende Bushy, MD  pantoprazole ([place])  40 MG tablet Take 1 tablet (40 mg total) by mouth daily. 11/28/16   Loletha Grayer, MD  Spacer/Aero Chamber Mouthpiece MISC 1 Units by Does not apply route every 4 (four) hours as needed (wheezing). 02/19/17   Darel Hong, MD  tiotropium (SPIRIVA HANDIHALER) 18 MCG inhalation capsule Place 1 capsule (18 mcg total) into inhaler and inhale daily. 11/16/16 11/16/17  Vaughan Basta, MD    No Known Allergies  Family History  Problem Relation Age of Onset  . Colon cancer Mother   . Cancer Father   . Cancer Sister   . Kidney disease Brother     Social History Social History   Tobacco Use  . Smoking status: Current Every Day Smoker    Packs/day: 0.15    Years: 40.00    Pack years: 6.00    Types: Cigarettes  . Smokeless tobacco: Never Used  Substance Use Topics  . Alcohol  use: No    Comment: pt reports quitting after learning about cirrhosis  . Drug use: No    Frequency: 7.0 times per week    Types: Marijuana, Cocaine    Review of Systems Constitutional: Negative for fever. Cardiovascular: Negative for chest pain. Respiratory: Mild shortness of breath which the patient blames on his abdominal distention due to fluid accumulation. Gastrointestinal: For abdominal distention. Genitourinary: [name]. Musculoskeletal: Negative for musculoskeletal complaints Skin: Negative for skin complaints  Neurological: Negative for headache All other ROS negative  ____________________________________________   PHYSICAL EXAM:  VITAL SIGNS: ED Triage Vitals [07/17/17 1126]  Enc Vitals Group     BP (!) 138/91     Pulse Rate 94     Resp 16     Temp 98.3 F (36.8 C)     Temp Source Oral     SpO2 97 %     Weight 159 lb (72.1 kg)     Height 6\' 3"  (1.905 m)     Head Circumference      Peak Flow      Pain Score 0     Pain Loc      Pain Edu?      Excl. in [place]?    Constitutional: Alert and oriented. Well appearing and in no distress. Eyes: Normal exam ENT   Head: Normocephalic and atraumatic.   Mouth/Throat: Mucous membranes are moist. Cardiovascular: Normal rate, regular rhythm. No murmur Respiratory: Normal respiratory effort without tachypnea nor retractions. Breath sounds are clear Gastrointestinal: Moderate abdominal distention, dull to percussion.  Nontender. Musculoskeletal: Nontender with normal range of motion in all extremities.  Neurologic:  Normal speech and language. No gross focal neurologic deficits  Skin:  Skin is warm, dry and intact.  Psychiatric: Mood and affect are normal.   ____________________________________________   INITIAL IMPRESSION / ASSESSMENT AND PLAN / ED COURSE  Pertinent labs & imaging results that were available during my care of the patient were reviewed by me and considered in my  medical decision making (see chart for details).  Patient presents to the emergency department for routine dialysis as well as abdominal distention hoping to have his paracentesis performed in addition to routine dialysis.  We will discuss with interventional radiology as well as nephrology in an attempt to arrange this.  Abdomen is nontender, no acute abnormalities on exam.  No concern for SBP we will closely monitor.  I discussed the patient with nephrology, they will be arranging for dialysis.  I discussed the patient with interventional radiology they will be arranging  for an ultrasound-guided paracentesis.  Patient's labs are resulted showing hyperkalemia at 7.0 however this is prior to dialysis. ____________________________________________   FINAL CLINICAL IMPRESSION(S) / ED DIAGNOSES  Routine dialysis Routine paracentesis    Harvest Dark, MD 07/17/17 1404

## 2017-07-17 NOTE — Progress Notes (Signed)
Post HD assessment. Pt tolerated tx well without c/o or complications. Net UF 3513, goal met.    07/17/17 1952  Vital Signs  Temp 98.5 F (36.9 C)  Temp Source Oral  Pulse Rate 97  Pulse Rate Source Monitor  Resp 13  BP 121/62  BP Location Left Arm  BP Method Automatic  Patient Position (if appropriate) Lying  Oxygen Therapy  SpO2 96 %  O2 Device Room Air  Dialysis Weight  Weight 69.5 kg (153 lb 3.5 oz)  Type of Weight Post-Dialysis  Post-Hemodialysis Assessment  Rinseback Volume (mL) 250 mL  KECN 77.8 V  Dialyzer Clearance Lightly streaked  Duration of HD Treatment -hour(s) 3.5 hour(s)  Hemodialysis Intake (mL) 500 mL  UF Total -Machine (mL) 4013 mL  Net UF (mL) 3513 mL  Tolerated HD Treatment Yes  Education / Care Plan  Dialysis Education Provided Yes  Documented Education in Care Plan Yes  Hemodialysis Catheter Right Internal jugular Double-lumen  No Placement Date or Time found.   Placed prior to admission: Yes  Orientation: Right  Access Location: Internal jugular  Hemodialysis Catheter Type: Double-lumen  Site Condition No complications  Blue Lumen Status Heparin locked  Red Lumen Status Heparin locked  Purple Lumen Status N/A  Catheter fill solution Heparin 1000 units/ml  Catheter fill volume (Arterial) 1.5 cc  Catheter fill volume (Venous) 1.5  Dressing Type Biopatch  Dressing Status Clean;Dry;Intact  Drainage Description None  Post treatment catheter status Capped and Clamped

## 2017-07-17 NOTE — Progress Notes (Signed)
Pre HD assessment   07/17/17 1603  Vital Signs  Temp 98.4 F (36.9 C)  Temp Source Oral  Pulse Rate 90  Pulse Rate Source Monitor  Resp 20  BP (!) 155/88  BP Location Left Arm  BP Method Automatic  Patient Position (if appropriate) Lying  Oxygen Therapy  SpO2 97 %  O2 Device Room Air  Pain Assessment  Pain Scale 0-10  Pain Score 0  Dialysis Weight  Weight 72 kg (158 lb 11.7 oz)  Type of Weight Pre-Dialysis  Time-Out for Hemodialysis  What Procedure? HD  Pt Identifiers(min of two) First/Last Name;MRN/Account#  Correct Site? Yes  Correct Side? Yes  Correct Procedure? Yes  Consents Verified? Yes  Rad Studies Available? N/A  Safety Precautions Reviewed? Yes  Engineer, civil (consulting) Number  ([id number])  Station Number 3  UF/Alarm Test Passed  Conductivity: Meter 14  Conductivity: Machine  14.3  pH 7.6  Reverse Osmosis main  Normal Saline Lot Number [id number]  Dialyzer Lot Number [id number]  Disposable Set Lot Number [id number]  Machine Temperature 98.6 F (37 C)  Musician and Audible Yes  Blood Lines Intact and Secured Yes  Pre Treatment Patient Checks  Vascular access used during treatment Catheter  Hepatitis B Surface Antigen Results Negative  Date Hepatitis B Surface Antigen Drawn 06/16/17  Hepatitis B Surface Antibody  (>10)  Date Hepatitis B Surface Antibody Drawn 03/01/17  Hemodialysis Consent Verified Yes  Hemodialysis Standing Orders Initiated Yes  ECG (Telemetry) Monitor On Yes  Prime Ordered Normal Saline  Length of  DialysisTreatment -hour(s) 3.5 Hour(s)  Dialyzer Elisio 17H NR  Dialysate 2K, 2.5 Ca  Dialysis Anticoagulant None  Dialysate Flow Ordered 800  Blood Flow Rate Ordered 400 mL/min  Ultrafiltration Goal 2.5 Liters  Pre Treatment Labs CBC;Phosphorus  Dialysis Blood Pressure Support Ordered Normal Saline  Education / Care Plan  Dialysis Education Provided Yes  Documented Education in Care Plan Yes  Hemodialysis Catheter Right Internal  jugular Double-lumen  No Placement Date or Time found.   Placed prior to admission: Yes  Orientation: Right  Access Location: Internal jugular  Hemodialysis Catheter Type: Double-lumen  Site Condition No complications  Blue Lumen Status Heparin locked  Red Lumen Status Heparin locked  Purple Lumen Status N/A  Dressing Type Biopatch  Dressing Status Clean;Dry;Intact  Drainage Description None

## 2017-07-17 NOTE — Progress Notes (Signed)
HD tx end   07/17/17 1944  Vital Signs  Pulse Rate 95  Pulse Rate Source Monitor  Resp (!) 22  BP 115/64  BP Location Left Arm  BP Method Automatic  Patient Position (if appropriate) Lying  Oxygen Therapy  SpO2 97 %  O2 Device Room Air  During Hemodialysis Assessment  Dialysis Fluid Bolus Normal Saline  Bolus Amount (mL) 250 mL  Intra-Hemodialysis Comments Tx completed

## 2017-07-17 NOTE — ED Notes (Signed)
Dialysis unit called to report pt is finished with dialysis and ready to be transported back to ED for d/c. Reports 3.5L removed during procedure.

## 2017-07-17 NOTE — Progress Notes (Signed)
HD tx start    07/17/17 1608  Vital Signs  Pulse Rate 94  Pulse Rate Source Monitor  Resp (!) 21  BP (!) 144/85  BP Location Left Arm  BP Method Automatic  Patient Position (if appropriate) Lying  Oxygen Therapy  SpO2 97 %  O2 Device Room Air  During Hemodialysis Assessment  Blood Flow Rate (mL/min) 400 mL/min  Arterial Pressure (mmHg) -160 mmHg  Venous Pressure (mmHg) 140 mmHg  Transmembrane Pressure (mmHg) 80 mmHg  Ultrafiltration Rate (mL/min) 1330 mL/min  Dialysate Flow Rate (mL/min) 800 ml/min  Conductivity: Machine  14.3  HD Safety Checks Performed Yes  Dialysis Fluid Bolus Normal Saline  Bolus Amount (mL) 250 mL  Intra-Hemodialysis Comments Tx initiated  Hemodialysis Catheter Right Internal jugular Double-lumen  No Placement Date or Time found.   Placed prior to admission: Yes  Orientation: Right  Access Location: Internal jugular  Hemodialysis Catheter Type: Double-lumen  Blue Lumen Status Infusing  Red Lumen Status Infusing

## 2017-07-17 NOTE — ED Notes (Signed)
Pt returned from ultrasound

## 2017-07-17 NOTE — ED Notes (Signed)
Pt transported to dialysis with albumin.

## 2017-07-17 NOTE — Progress Notes (Signed)
Post HD assessment    07/17/17 1955  Neurological  Level of Consciousness Alert  Orientation Level Oriented X4  Respiratory  Respiratory Pattern Regular;Unlabored  Chest Assessment Chest expansion symmetrical  Cough Non-productive  Cardiac  ECG Monitor Yes  Vascular  R Radial Pulse +2  L Radial Pulse +2  Edema Generalized  Integumentary  Integumentary (WDL) X  Skin Color Appropriate for ethnicity  Musculoskeletal  Musculoskeletal (WDL) X  Generalized Weakness Yes  Assistive Device None  GU Assessment  Genitourinary (WDL) X  Genitourinary Symptoms  (HD)  Psychosocial  Psychosocial (WDL) WDL

## 2017-07-17 NOTE — ED Notes (Signed)
Date and time results received: 07/17/17 1325 (use smartphrase ".now" to insert current time)  Test: potassium Critical Value: 7.0  Name of Provider Notified: Kerman Passey, MD  Orders Received? Or Actions Taken?: pt going to dialysis today

## 2017-07-17 NOTE — ED Notes (Signed)
Pt here for dialysis

## 2017-07-17 NOTE — Progress Notes (Signed)
Pre HD assessment    07/17/17 1604  Neurological  Level of Consciousness Alert  Orientation Level Oriented X4  Respiratory  Respiratory Pattern Regular;Unlabored  Chest Assessment Chest expansion symmetrical  Cough Non-productive  Cardiac  Pulse Regular  ECG Monitor Yes  Vascular  R Radial Pulse +2  L Radial Pulse +2  Edema Generalized  Integumentary  Integumentary (WDL) X  Skin Color Appropriate for ethnicity  Musculoskeletal  Musculoskeletal (WDL) X  Generalized Weakness Yes  Assistive Device None  GU Assessment  Genitourinary (WDL) X  Genitourinary Symptoms  (HD)  Psychosocial  Psychosocial (WDL) WDL

## 2017-07-17 NOTE — ED Triage Notes (Signed)
PT arrived for scheduled MWF dialysis. No pain or problems addressed in triage. PT in NAD

## 2017-07-17 NOTE — ED Notes (Signed)
Pt to ultrasound for paracentesis.

## 2017-07-19 ENCOUNTER — Non-Acute Institutional Stay
Admission: EM | Admit: 2017-07-19 | Discharge: 2017-07-19 | Disposition: A | Discharge status: Home / Self Care | Payer: Medicare Other | Attending: Emergency Medicine | Admitting: Emergency Medicine

## 2017-07-19 ENCOUNTER — Other Ambulatory Visit: Payer: Self-pay

## 2017-07-19 ENCOUNTER — Encounter: Payer: Self-pay | Admitting: Emergency Medicine

## 2017-07-19 DIAGNOSIS — E785 Hyperlipidemia, unspecified: Secondary | ICD-10-CM | POA: Insufficient documentation

## 2017-07-19 DIAGNOSIS — Z951 Presence of aortocoronary bypass graft: Secondary | ICD-10-CM | POA: Diagnosis not present

## 2017-07-19 DIAGNOSIS — Z79899 Other long term (current) drug therapy: Secondary | ICD-10-CM | POA: Insufficient documentation

## 2017-07-19 DIAGNOSIS — I2510 Atherosclerotic heart disease of native coronary artery without angina pectoris: Secondary | ICD-10-CM | POA: Insufficient documentation

## 2017-07-19 DIAGNOSIS — I509 Heart failure, unspecified: Secondary | ICD-10-CM | POA: Diagnosis not present

## 2017-07-19 DIAGNOSIS — Z992 Dependence on renal dialysis: Secondary | ICD-10-CM | POA: Insufficient documentation

## 2017-07-19 DIAGNOSIS — D631 Anemia in chronic kidney disease: Secondary | ICD-10-CM | POA: Insufficient documentation

## 2017-07-19 DIAGNOSIS — I739 Peripheral vascular disease, unspecified: Secondary | ICD-10-CM | POA: Diagnosis not present

## 2017-07-19 DIAGNOSIS — K210 Gastro-esophageal reflux disease with esophagitis: Secondary | ICD-10-CM | POA: Diagnosis not present

## 2017-07-19 DIAGNOSIS — N186 End stage renal disease: Secondary | ICD-10-CM | POA: Diagnosis not present

## 2017-07-19 DIAGNOSIS — J449 Chronic obstructive pulmonary disease, unspecified: Secondary | ICD-10-CM | POA: Diagnosis not present

## 2017-07-19 DIAGNOSIS — K7460 Unspecified cirrhosis of liver: Secondary | ICD-10-CM | POA: Diagnosis not present

## 2017-07-19 DIAGNOSIS — I132 Hypertensive heart and chronic kidney disease with heart failure and with stage 5 chronic kidney disease, or end stage renal disease: Secondary | ICD-10-CM | POA: Diagnosis not present

## 2017-07-19 DIAGNOSIS — F17210 Nicotine dependence, cigarettes, uncomplicated: Secondary | ICD-10-CM | POA: Diagnosis not present

## 2017-07-19 MED ORDER — PENTAFLUOROPROP-TETRAFLUOROETH EX AERO: 1.0000 "application " | INHALATION_SPRAY | CUTANEOUS | Status: DC | PRN

## 2017-07-19 MED ORDER — SODIUM CHLORIDE 0.9 % IV SOLN: 100.0000 mL | INTRAVENOUS | Status: DC | PRN

## 2017-07-19 MED ORDER — ALTEPLASE 2 MG IJ SOLR
2.0000 mg | Freq: Once | INTRAMUSCULAR | Status: DC | PRN
  Filled 2017-07-19: qty 2

## 2017-07-19 MED ORDER — LIDOCAINE-PRILOCAINE 2.5-2.5 % EX CREA: 1.0000 "application " | TOPICAL_CREAM | CUTANEOUS | Status: DC | PRN

## 2017-07-19 MED ORDER — HEPARIN SODIUM (PORCINE) 1000 UNIT/ML DIALYSIS
1000.0000 [IU] | INTRAMUSCULAR | Status: DC | PRN
  Filled 2017-07-19: qty 1

## 2017-07-19 MED ORDER — LIDOCAINE HCL (PF) 1 % IJ SOLN: 5.0000 mL | INTRAMUSCULAR | Status: DC | PRN

## 2017-07-19 NOTE — Progress Notes (Signed)
Post HD Tx    07/19/17 1330  Neurological  Level of Consciousness Alert  Orientation Level Oriented X4  Respiratory  Respiratory Pattern Regular;Unlabored  Chest Assessment Chest expansion symmetrical  Bilateral Breath Sounds Clear;Diminished  Cough Non-productive  Cardiac  Pulse Regular  Heart Sounds S1, S2  ECG Monitor Yes  Vascular  R Radial Pulse +2  L Radial Pulse +2  Edema Generalized  Psychosocial  Psychosocial (WDL) WDL

## 2017-07-19 NOTE — ED Triage Notes (Signed)
Pt arrived for scheduled MWF, dialysis. NAD.

## 2017-07-19 NOTE — Progress Notes (Signed)
HD Tx started    07/19/17 1005  During Hemodialysis Assessment  Blood Flow Rate (mL/min) 400 mL/min  Arterial Pressure (mmHg) -150 mmHg  Venous Pressure (mmHg) 120 mmHg  Transmembrane Pressure (mmHg) 100 mmHg  Ultrafiltration Rate (mL/min) 2340 mL/min  Dialysate Flow Rate (mL/min) 800 ml/min  Conductivity: Machine  14.1  HD Safety Checks Performed Yes  Dialysis Fluid Bolus Normal Saline  Bolus Amount (mL) 250 mL  Intra-Hemodialysis Comments Tx initiated

## 2017-07-19 NOTE — ED Provider Notes (Signed)
[facility] Emergency Department Provider Note  ____________________________________________   I have reviewed the triage vital signs and the nursing notes. Where available I have reviewed prior notes and, if possible and indicated, outside hospital notes.    HISTORY  Chief Complaint No chief complaint on file.    HPI Stanley [name] is a 57 y.o. male who here for his routine dialysis which, for some reason, he gets in the emergency department.  Patient is a Monday Wednesday Friday dialysis patient has not missed dialysis no shortness of breath no leg swelling        Past Medical History:  Diagnosis Date  . Alcohol abuse   . CHF (congestive heart failure) ([place])   . Cirrhosis ([place])   . Coronary artery disease 2009  . Drug abuse ([place])   . End stage renal disease on dialysis [facility]) NEPHROLOGIST-   DR [facility]  IN [place]   HEMODIALYSIS --   TUES/  THURS/  SAT  . Gastrointestinal bleed 06/13/2017   From chart...hx of multiple GI bleeds  . GERD (gastroesophageal reflux disease)   . Hyperlipidemia   . Hypertension   . PAD (peripheral artery disease) ([place])   . Renal insufficiency    Per pt, 32 oz fluid restriction per day  . S/P triple vessel bypass 06/09/2016   2009ish  . Suicidal ideation    & HOMICIDAL IDEATION --  06-16-2013   ADMITTED TO BEHAVIOR HEALTH    Patient Active Problem List   Diagnosis Date Noted  . Protein-calorie malnutrition, severe 06/14/2017  . Encounter for dialysis [facility])   . Palliative care by specialist   . Goals of care, counseling/discussion   . Malnutrition of moderate degree 06/05/2017  . Secondary esophageal varices without bleeding ([place])   . Stomach irritation   . Idiopathic esophageal varices without bleeding ([place])   . Alcoholic hepatitis with ascites 05/24/2017  . ESRD (end stage renal disease) ([place]) 04/28/2017  . Uremia 03/08/2017  . ESRD on hemodialysis ([place]) 03/03/2017  . Weakness 02/28/2017  .  Hypocalcemia 02/22/2017  . Shortness of breath 11/26/2016  . COPD (chronic obstructive pulmonary disease) ([place]) 10/30/2016  . COPD exacerbation ([place]) 10/29/2016  . Anemia   . Heme positive stool   . Ulceration of intestine   . Benign neoplasm of transverse colon   . Acute gastrointestinal hemorrhage   . Esophageal candidiasis ([place])   . Angiodysplasia of intestinal tract   . Acute respiratory failure with hypoxia ([place]) 07/03/2016  . GI bleeding 06/24/2016  . Rectal bleeding 06/14/2016  . Anemia of chronic disease 06/01/2016  . MRSA carrier 06/01/2016  . Chronic renal failure 05/23/2016  . Ischemic heart disease 05/23/2016  . Angiodysplasia of small intestine   . Melena   . Small bowel bleed not requiring more than 4 units of blood in 24 hours, ICU, or surgery   . Anemia due to chronic blood loss   . Abdominal pain 05/05/2016  . Acute posthemorrhagic anemia 04/17/2016  . Gastrointestinal bleed 04/17/2016  . History of esophagogastroduodenoscopy (EGD) 04/17/2016  . Elevated troponin 04/17/2016  . Alcohol abuse 04/17/2016  . Upper GI bleed 01/19/2016  . Blood in stool   . Angiodysplasia of stomach and duodenum with hemorrhage   . Gastritis   . Reflux esophagitis   . GI bleed 05/16/2015  . Acute GI bleeding   . Symptomatic anemia 04/30/2015  . HTN (hypertension) 04/06/2015  . GERD (gastroesophageal reflux disease) 04/06/2015  . HLD (hyperlipidemia) 04/06/2015  .  Dyspnea 04/06/2015  . Cirrhosis of liver with ascites ([place]) 04/06/2015  . Ascites 04/06/2015  . GIB (gastrointestinal bleeding) 03/23/2015  . Homicidal ideation 06/19/2013  . Suicidal intent 06/19/2013  . Homicidal ideations 06/19/2013  . Hyperkalemia 06/16/2013  . Mandible fracture ([place]) 06/05/2013  . Fracture, mandible ([place]) 06/02/2013  . Coronary atherosclerosis of native coronary artery 06/02/2013  . ESRD on dialysis ([place]) 06/02/2013  . Mandible open fracture ([place]) 06/02/2013    Past Surgical History:   Procedure Laterality Date  . A/V FISTULAGRAM Right 06/06/2017   Procedure: A/V FISTULAGRAM;  Surgeon: Katha Cabal, MD;  Location: [place] CV LAB;  Service: Cardiovascular;  Laterality: Right;  . A/V SHUNT INTERVENTION N/A 06/06/2017   Procedure: A/V SHUNT INTERVENTION;  Surgeon: Katha Cabal, MD;  Location: [place] CV LAB;  Service: Cardiovascular;  Laterality: N/A;  . AGILE CAPSULE N/A 06/19/2016   Procedure: AGILE CAPSULE;  Surgeon: [name], MD;  Location: [facility] ENDOSCOPY;  Service: Endoscopy;  Laterality: N/A;  . COLONOSCOPY WITH PROPOFOL N/A 06/18/2016   Procedure: COLONOSCOPY WITH PROPOFOL;  Surgeon: [name], MD;  Location: [facility] ENDOSCOPY;  Service: Endoscopy;  Laterality: N/A;  . COLONOSCOPY WITH PROPOFOL N/A 08/12/2016   Procedure: COLONOSCOPY WITH PROPOFOL;  Surgeon: [name], MD;  Location: [facility] ENDOSCOPY;  Service: Endoscopy;  Laterality: N/A;  . COLONOSCOPY WITH PROPOFOL N/A 05/05/2017   Procedure: COLONOSCOPY WITH PROPOFOL;  Surgeon: Manya Silvas, MD;  Location: [facility] ENDOSCOPY;  Service: Endoscopy;  Laterality: N/A;  . CORONARY ANGIOPLASTY  ?   PT UNABLE TO TELL IF  BEFORE OR AFTER  CABG  . CORONARY ARTERY BYPASS GRAFT  2008  ([place] , [place])   3 VESSEL  . DIALYSIS FISTULA CREATION  LAST SURGERY  APPOX  2008  . ENTEROSCOPY N/A 05/10/2016   Procedure: ENTEROSCOPY;  Surgeon: Jerene Bears, MD;  Location: [place];  Service: Gastroenterology;  Laterality: N/A;  . ENTEROSCOPY N/A 08/12/2016   Procedure: ENTEROSCOPY;  Surgeon: [name], MD;  Location: [facility] ENDOSCOPY;  Service: Endoscopy;  Laterality: N/A;  . ENTEROSCOPY Left 06/03/2017   Procedure: ENTEROSCOPY;  Surgeon: Virgel Manifold, MD;  Location: [facility] ENDOSCOPY;  Service: Endoscopy;  Laterality: Left;  Procedure date will ultimately depend on when patient is medically optimized before the procedure, pending hemodialysis and blood transfusions etc. Will place on schedule and change depending  on clinical status.   . ENTEROSCOPY N/A 06/05/2017   Procedure: ENTEROSCOPY;  Surgeon: Virgel Manifold, MD;  Location: [facility] ENDOSCOPY;  Service: Endoscopy;  Laterality: N/A;  . ENTEROSCOPY N/A 06/15/2017   Procedure: Push ENTEROSCOPY;  Surgeon: [name], MD;  Location: [facility] ENDOSCOPY;  Service: Endoscopy;  Laterality: N/A;  . ESOPHAGOGASTRODUODENOSCOPY N/A 05/07/2015   Procedure: ESOPHAGOGASTRODUODENOSCOPY (EGD);  Surgeon: Hulen Luster, MD;  Location: [facility] ENDOSCOPY;  Service: Endoscopy;  Laterality: N/A;  . ESOPHAGOGASTRODUODENOSCOPY (EGD) WITH PROPOFOL N/A 05/17/2015   Procedure: ESOPHAGOGASTRODUODENOSCOPY (EGD) WITH PROPOFOL;  Surgeon: [name], MD;  Location: [facility] ENDOSCOPY;  Service: Endoscopy;  Laterality: N/A;  . ESOPHAGOGASTRODUODENOSCOPY (EGD) WITH PROPOFOL N/A 01/20/2016   Procedure: ESOPHAGOGASTRODUODENOSCOPY (EGD) WITH PROPOFOL;  Surgeon: [name], MD;  Location: [facility] ENDOSCOPY;  Service: Endoscopy;  Laterality: N/A;  . ESOPHAGOGASTRODUODENOSCOPY (EGD) WITH PROPOFOL N/A 04/17/2016   Procedure: ESOPHAGOGASTRODUODENOSCOPY (EGD) WITH PROPOFOL;  Surgeon: Lin Landsman, MD;  Location: [facility] ENDOSCOPY;  Service: Gastroenterology;  Laterality: N/A;  . ESOPHAGOGASTRODUODENOSCOPY (EGD) WITH PROPOFOL  05/09/2016   Procedure: ESOPHAGOGASTRODUODENOSCOPY (EGD) WITH PROPOFOL;  Surgeon: Jerene Bears, MD;  Location: [facility] ENDOSCOPY;  Service: Endoscopy;;  . ESOPHAGOGASTRODUODENOSCOPY (EGD) WITH PROPOFOL N/A 06/16/2016   Procedure: ESOPHAGOGASTRODUODENOSCOPY (EGD) WITH PROPOFOL;  Surgeon: [name], MD;  Location: [facility] ENDOSCOPY;  Service: Endoscopy;  Laterality: N/A;  . ESOPHAGOGASTRODUODENOSCOPY (EGD) WITH PROPOFOL N/A 05/05/2017   Procedure: ESOPHAGOGASTRODUODENOSCOPY (EGD) WITH PROPOFOL;  Surgeon: Manya Silvas, MD;  Location[facility] ENDOSCOPY;  Service: Endoscopy;  Laterality: N/A;  . ESOPHAGOGASTRODUODENOSCOPY (EGD) WITH PROPOFOL N/A 06/15/2017   Procedure: ESOPHAGOGASTRODUODENOSCOPY (EGD) WITH  PROPOFOL;  Surgeon: [name], MD;  Location: [facility] ENDOSCOPY;  Service: Endoscopy;  Laterality: N/A;  . GIVENS CAPSULE STUDY N/A 05/07/2016   Procedure: GIVENS CAPSULE STUDY;  Surgeon: Doran Stabler, MD;  Location: [place];  Service: Endoscopy;  Laterality: N/A;  . MANDIBULAR HARDWARE REMOVAL N/A 07/29/2013   Procedure: REMOVAL OF ARCH BARS;  Surgeon: Theodoro Kos, DO;  Location: [place];  Service: Plastics;  Laterality: N/A;  . ORIF MANDIBULAR FRACTURE N/A 06/05/2013   Procedure: REPAIR OF MANDIBULAR FRACTURE x 2 with maxillo-mandibular fixation ;  Surgeon: Theodoro Kos, DO;  Location: [place];  Service: Plastics;  Laterality: N/A;  . PERIPHERAL ARTERIAL STENT GRAFT Left     Prior to Admission medications   Medication Sig Start Date End Date Taking? Authorizing Provider  albuterol (PROVENTIL HFA;VENTOLIN HFA) 108 (90 Base) MCG/ACT inhaler Inhale 2 puffs into the lungs every 6 (six) hours as needed for wheezing or shortness of breath. 04/21/17   Lisa Roca, MD  alum & mag hydroxide-simeth (MAALOX/MYLANTA) 200-200-20 MG/5ML suspension Take 15 mLs every 4 (four) hours as needed by mouth for indigestion or heartburn. Patient not taking: Reported on 04/19/2017 01/25/17   Demetrios Loll, MD  atorvastatin (LIPITOR) 40 MG tablet Take 1 tablet (40 mg total) by mouth daily. 11/16/16   Vaughan Basta, MD  budesonide-formoterol (SYMBICORT) 160-4.5 MCG/ACT inhaler Inhale 2 puffs into the lungs daily. 11/16/16   Vaughan Basta, MD  calcium acetate (PHOSLO) 667 MG capsule Take 2,001 mg by mouth 3 (three) times daily.    [provider]  doxycycline (VIBRA-TABS) 100 MG tablet Take 1 tablet (100 mg total) by mouth 2 (two) times daily. 07/10/17   Harvest Dark, MD  ferrous sulfate 325 (65 FE) MG tablet Take 1 tablet by mouth 3 (three) times daily. 01/21/17   [provider]  furosemide (LASIX) 80 MG tablet Take 1 tablet (80 mg total) daily by mouth.  01/25/17   Demetrios Loll, MD  gabapentin (NEURONTIN) 300 MG capsule Take 1 capsule by mouth daily as directed 08/16/16   [provider]  ipratropium-albuterol (DUONEB) 0.5-2.5 (3) MG/3ML SOLN Take 3 mLs by nebulization every 6 (six) hours as needed. 11/28/16   Loletha Grayer, MD  labetalol (NORMODYNE) 100 MG tablet Take 100 mg by mouth daily. 03/24/17   [provider]  Multiple Vitamins-Minerals-FA (DIALYVITE SUPREME D) 3 MG TABS Take 1 tablet (3 mg total) by mouth daily. 11/28/16   Loletha Grayer, MD  nitroGLYCERIN (NITROSTAT) 0.4 MG SL tablet Place 1 tablet (0.4 mg total) under the tongue every 5 (five) minutes as needed. 04/12/16   Wende Bushy, MD  pantoprazole (PROTONIX) 40 MG tablet Take 1 tablet (40 mg total) by mouth daily. 11/28/16   Loletha Grayer, MD  Spacer/Aero Chamber Mouthpiece MISC 1 Units by Does not apply route every 4 (four) hours as needed (wheezing). 02/19/17   Darel Hong, MD  tiotropium (SPIRIVA HANDIHALER) 18 MCG inhalation capsule Place 1 capsule (18 mcg total) into inhaler and inhale daily.  11/16/16 11/16/17  Vaughan Basta, MD    Allergies Patient has no known allergies.  Family History  Problem Relation Age of Onset  . Colon cancer Mother   . Cancer Father   . Cancer Sister   . Kidney disease Brother     Social History Social History   Tobacco Use  . Smoking status: Current Every Day Smoker    Packs/day: 0.15    Years: 40.00    Pack years: 6.00    Types: Cigarettes  . Smokeless tobacco: Never Used  Substance Use Topics  . Alcohol use: No    Comment: pt reports quitting after learning about cirrhosis  . Drug use: No    Frequency: 7.0 times per week    Types: Marijuana, Cocaine    Review of Systems   ____________________________________________   PHYSICAL EXAM:  VITAL SIGNS: ED Triage Vitals [07/19/17 0938]  Enc Vitals Group     BP 133/78     Pulse Rate 95     Resp 20     Temp 97.9 F (36.6 C)     Temp Source  Oral     SpO2 100 %     Weight      Height      Head Circumference      Peak Flow      Pain Score      Pain Loc      Pain Edu?      Excl. in [place]?     Constitutional: Alert and oriented. Well appearing and in no acute distress. Cardiovascular: Normal rate, regular rhythm. Grossly normal heart sounds.  Good peripheral circulation. Respiratory: Normal respiratory effort.  No retractions. Lungs CTAB.ss  Musculoskeletal: No lower extremity tenderness, no upper extremity tenderness. No joint effusions, no DVT signs strong distal pulses no edema   ____________________________________________   LABS (all labs ordered are listed, but only abnormal results are displayed)  Labs Reviewed  RENAL FUNCTION PANEL  CBC  PHOSPHORUS  COMPREHENSIVE METABOLIC PANEL    Pertinent labs  results that were available during my care of the patient were reviewed by me and considered in my medical decision making (see chart for details). ____________________________________________  EKG  I personally interpreted any EKGs ordered by me or triage  ____________________________________________  RADIOLOGY  Pertinent labs & imaging results that were available during my care of the patient were reviewed by me and considered in my medical decision making (see chart for details). If possible, patient and/or family made aware of any abnormal findings.  No results found. ____________________________________________    PROCEDURES  Procedure(s) performed: None  Procedures  Critical Care performed: None  ____________________________________________   INITIAL IMPRESSION / ASSESSMENT AND PLAN / ED COURSE  Pertinent labs & imaging results that were available during my care of the patient were reviewed by me and considered in my medical decision making (see chart for details). Patient in the emergency room for routine dialysis, as per usual, for some reason.     ____________________________________________   FINAL CLINICAL IMPRESSION(S) / ED DIAGNOSES  Final diagnoses:  None      This chart was dictated using voice recognition software.  Despite best efforts to proofread,  errors can occur which can change meaning.      Schuyler Amor, MD 07/19/17 [phone]

## 2017-07-19 NOTE — Progress Notes (Signed)
Pre HD Tx   07/19/17 1000  Vital Signs  Temp 97.8 F (36.6 C)  Temp Source Oral  Pulse Rate 94  Pulse Rate Source Monitor  Resp (!) 21  BP (!) 153/91  BP Location Left Arm  BP Method Automatic  Patient Position (if appropriate) Sitting  Oxygen Therapy  SpO2 100 %  O2 Device Room Air  Pain Assessment  Pain Scale 0-10  Pain Score 0  Dialysis Weight  Weight 69.4 kg (153 lb)  Type of Weight Pre-Dialysis  Time-Out for Hemodialysis  What Procedure? HD  Pt Identifiers(min of two) First/Last Name;MRN/Account#  Correct Site? Yes  Correct Side? Yes  Correct Procedure? Yes  Consents Verified? Yes  Rad Studies Available? N/A  Safety Precautions Reviewed? Yes  Engineer, civil (consulting) Number [id number]  Station Number 3  UF/Alarm Test Passed  Conductivity: Meter 14  Conductivity: Machine  14.1  pH 7.4  Reverse Osmosis Main  Normal Saline Lot Number [id number]  Dialyzer Lot Number [id number]  Disposable Set Lot Number [id number]  Machine Temperature 98.6 F (37 C)  Musician and Audible Yes  Blood Lines Intact and Secured Yes  Pre Treatment Patient Checks  Vascular access used during treatment Catheter  Patient is receiving dialysis in a chair Yes  Hepatitis B Surface Antigen Results Negative  Date Hepatitis B Surface Antigen Drawn 03/01/17  Hepatitis B Surface Antibody  (>10)  Date Hepatitis B Surface Antibody Drawn 03/01/17 (12)  Hemodialysis Consent Verified Yes  Hemodialysis Standing Orders Initiated Yes  ECG (Telemetry) Monitor On Yes  [place] Ordered Normal Saline  Length of  DialysisTreatment -hour(s) 3.5 Hour(s)  Dialysis Treatment Comments Na 140 (1K Bath for first 1.5hrs, 2K bath for remainder )  Dialyzer Elisio 17H NR  Dialysate 1K  Dialysis Anticoagulant None  Dialysate Flow Ordered 800  Blood Flow Rate Ordered 400 mL/min  Ultrafiltration Goal 2.5 Liters  Pre Treatment Labs Renal panel;CBC;Phosphorus  Dialysis Blood Pressure Support Ordered Normal Saline   Hemodialysis Catheter Right Internal jugular Double-lumen  No Placement Date or Time found.   Placed prior to admission: Yes  Orientation: Right  Access Location: Internal jugular  Hemodialysis Catheter Type: Double-lumen  Site Condition No complications  Blue Lumen Status Flushed  Red Lumen Status Flushed  Purple Lumen Status N/A  Dressing Type Biopatch;Occlusive  Dressing Status Clean;Dry;Intact  Interventions Removed  Drainage Description None  Dressing Change Due 07/26/17

## 2017-07-19 NOTE — ED Notes (Signed)
Pt given lunch tray and drink, as soon as he got his request he immediately said he had to leave. VSS. NAD. Pt reports I dont need those papers. Dont give me those.

## 2017-07-19 NOTE — Progress Notes (Signed)
Post HD tx

## 2017-07-19 NOTE — ED Notes (Signed)
Pt taken to dialysis 

## 2017-07-19 NOTE — Progress Notes (Signed)
HD tx ended   07/19/17 1315  Vital Signs  Pulse Rate 98  Pulse Rate Source Monitor  Resp 19  BP 123/71  BP Location Left Arm  BP Method Automatic  Patient Position (if appropriate) Sitting  Oxygen Therapy  SpO2 99 %  O2 Device Room Air  During Hemodialysis Assessment  HD Safety Checks Performed Yes  Dialysis Fluid Bolus Normal Saline  Bolus Amount (mL) 250 mL  Intra-Hemodialysis Comments Tx completed;Tolerated well

## 2017-07-19 NOTE — Progress Notes (Signed)
Pre HD assessment    07/19/17 0955  Neurological  Level of Consciousness Alert  Orientation Level Oriented X4  Respiratory  Respiratory Pattern Regular;Unlabored  Chest Assessment Chest expansion symmetrical  Cough Non-productive  Cardiac  Pulse Regular  Heart Sounds S1, S2  ECG Monitor Yes  Vascular  R Radial Pulse +2  L Radial Pulse +2  Edema Generalized  Psychosocial  Psychosocial (WDL) WDL

## 2017-07-19 NOTE — ED Notes (Signed)
First Nurse Note:  Patient ambulatory to ED for dialysis.  Taken to [facility], Claiborne Billings RN aware of placement.

## 2017-07-19 NOTE — ED Provider Notes (Signed)
-----------------------------------------   2:39 PM on 07/19/2017 -----------------------------------------  Down from dialysis vital signs are reassuring he was ready to go and got up and left.  Currently, he did not want to add to his thrice weekly collection of discharge instructions from this department.  He was in nad, I observed, prior to his self discharge.   Schuyler Amor, MD 07/19/17 1440

## 2017-07-21 ENCOUNTER — Encounter: Payer: Self-pay | Admitting: Emergency Medicine

## 2017-07-21 ENCOUNTER — Emergency Department
Admission: EM | Admit: 2017-07-21 | Discharge: 2017-07-21 | Disposition: A | Discharge status: Home / Self Care | Payer: Medicare Other | Attending: Emergency Medicine | Admitting: Emergency Medicine

## 2017-07-21 ENCOUNTER — Other Ambulatory Visit: Payer: Self-pay

## 2017-07-21 DIAGNOSIS — Z992 Dependence on renal dialysis: Secondary | ICD-10-CM | POA: Diagnosis not present

## 2017-07-21 DIAGNOSIS — Z4931 Encounter for adequacy testing for hemodialysis: Secondary | ICD-10-CM | POA: Diagnosis not present

## 2017-07-21 DIAGNOSIS — F17210 Nicotine dependence, cigarettes, uncomplicated: Secondary | ICD-10-CM | POA: Insufficient documentation

## 2017-07-21 DIAGNOSIS — I2510 Atherosclerotic heart disease of native coronary artery without angina pectoris: Secondary | ICD-10-CM | POA: Insufficient documentation

## 2017-07-21 DIAGNOSIS — Z79899 Other long term (current) drug therapy: Secondary | ICD-10-CM | POA: Diagnosis not present

## 2017-07-21 DIAGNOSIS — J449 Chronic obstructive pulmonary disease, unspecified: Secondary | ICD-10-CM | POA: Insufficient documentation

## 2017-07-21 DIAGNOSIS — N186 End stage renal disease: Secondary | ICD-10-CM | POA: Diagnosis not present

## 2017-07-21 DIAGNOSIS — D123 Benign neoplasm of transverse colon: Secondary | ICD-10-CM | POA: Diagnosis not present

## 2017-07-21 DIAGNOSIS — I509 Heart failure, unspecified: Secondary | ICD-10-CM | POA: Insufficient documentation

## 2017-07-21 DIAGNOSIS — I132 Hypertensive heart and chronic kidney disease with heart failure and with stage 5 chronic kidney disease, or end stage renal disease: Secondary | ICD-10-CM | POA: Diagnosis not present

## 2017-07-21 DIAGNOSIS — Z951 Presence of aortocoronary bypass graft: Secondary | ICD-10-CM | POA: Diagnosis not present

## 2017-07-21 LAB — RENAL FUNCTION PANEL
Albumin: 2.6 g/dL — ABNORMAL LOW (ref 3.5–5.0)
Anion gap: 8 (ref 5–15)
BUN: 56 mg/dL — ABNORMAL HIGH (ref 6–20)
CHLORIDE: 96 mmol/L — AB (ref 101–111)
CO2: 35 mmol/L — AB (ref 22–32)
CREATININE: 4.55 mg/dL — AB (ref 0.61–1.24)
Calcium: 8.4 mg/dL — ABNORMAL LOW (ref 8.9–10.3)
GFR, EST AFRICAN AMERICAN: 15 mL/min — AB (ref >60)
GFR, EST NON AFRICAN AMERICAN: 13 mL/min — AB (ref >60)
Glucose, Bld: 114 mg/dL — ABNORMAL HIGH (ref 65–99)
Phosphorus: 3 mg/dL (ref 2.5–4.6)
Potassium: 5.8 mmol/L — ABNORMAL HIGH (ref 3.5–5.1)
Sodium: 139 mmol/L (ref 135–145)

## 2017-07-21 LAB — CBC
HCT: 23.8 % — ABNORMAL LOW (ref 40.0–52.0)
HEMOGLOBIN: 7.4 g/dL — AB (ref 13.0–18.0)
MCH: 25.8 pg — AB (ref 26.0–34.0)
MCHC: 31.3 g/dL — ABNORMAL LOW (ref 32.0–36.0)
MCV: 82.5 fL (ref 80.0–100.0)
PLATELETS: 298 10*3/uL (ref 150–440)
RBC: 2.89 MIL/uL — AB (ref 4.40–5.90)
RDW: 16.9 % — ABNORMAL HIGH (ref 11.5–14.5)
WBC: 6.8 10*3/uL (ref 3.8–10.6)

## 2017-07-21 MED ORDER — LIDOCAINE HCL (PF) 1 % IJ SOLN: 5.0000 mL | INTRAMUSCULAR | Status: DC | PRN

## 2017-07-21 MED ORDER — LIDOCAINE-PRILOCAINE 2.5-2.5 % EX CREA: 1.0000 "application " | TOPICAL_CREAM | CUTANEOUS | Status: DC | PRN

## 2017-07-21 MED ORDER — SODIUM CHLORIDE 0.9 % IV SOLN: 100.0000 mL | INTRAVENOUS | Status: DC | PRN

## 2017-07-21 MED ORDER — ALTEPLASE 2 MG IJ SOLR
2.0000 mg | Freq: Once | INTRAMUSCULAR | Status: DC | PRN
  Filled 2017-07-21: qty 2

## 2017-07-21 MED ORDER — PENTAFLUOROPROP-TETRAFLUOROETH EX AERO: 1.0000 "application " | INHALATION_SPRAY | CUTANEOUS | Status: DC | PRN

## 2017-07-21 MED ORDER — DIPHENHYDRAMINE HCL 50 MG/ML IJ SOLN
25.0000 mg | Freq: Once | INTRAMUSCULAR | Status: AC
  Administered 2017-07-21: 25 mg via INTRAVENOUS

## 2017-07-21 MED ORDER — HEPARIN SODIUM (PORCINE) 1000 UNIT/ML DIALYSIS
1000.0000 [IU] | INTRAMUSCULAR | Status: DC | PRN
  Filled 2017-07-21: qty 1

## 2017-07-21 NOTE — ED Provider Notes (Signed)
[facility] Emergency Department Provider Note  ___________________________________________   First MD Initiated Contact with Patient 07/21/17 1129     (approximate)  I have reviewed the triage vital signs and the nursing notes.   HISTORY  Chief Complaint Dialysis   HPI Capers Hagmann is a 57 y.o. male with history of CHF as well as end-stage renal disease on dialysis was presented to the emergency department today with for his scheduled dialysis appointment.  He has no complaints at this time.  Does not have any shortness of breath, chest pain or abdominal pain.  Last dialysis performed on 8 May.  Past Medical History:  Diagnosis Date  . Alcohol abuse   . CHF (congestive heart failure) ([place])   . Cirrhosis ([place])   . Coronary artery disease 2009  . Drug abuse ([place])   . End stage renal disease on dialysis [facility]) NEPHROLOGIST-   DR [facility]  IN [place]   HEMODIALYSIS --   TUES/  THURS/  SAT  . Gastrointestinal bleed 06/13/2017   From chart...hx of multiple GI bleeds  . GERD (gastroesophageal reflux disease)   . Hyperlipidemia   . Hypertension   . PAD (peripheral artery disease) ([place])   . Renal insufficiency    Per pt, 32 oz fluid restriction per day  . S/P triple vessel bypass 06/09/2016   2009ish  . Suicidal ideation    & HOMICIDAL IDEATION --  06-16-2013   ADMITTED TO BEHAVIOR HEALTH    Patient Active Problem List   Diagnosis Date Noted  . Protein-calorie malnutrition, severe 06/14/2017  . Encounter for dialysis [facility])   . Palliative care by specialist   . Goals of care, counseling/discussion   . Malnutrition of moderate degree 06/05/2017  . Secondary esophageal varices without bleeding ([place])   . Stomach irritation   . Idiopathic esophageal varices without bleeding ([place])   . Alcoholic hepatitis with ascites 05/24/2017  . ESRD (end stage renal disease) ([place]) 04/28/2017  . Uremia 03/08/2017  . ESRD on hemodialysis ([place]) 03/03/2017  .  Weakness 02/28/2017  . Hypocalcemia 02/22/2017  . Shortness of breath 11/26/2016  . COPD (chronic obstructive pulmonary disease) ([place]) 10/30/2016  . COPD exacerbation ([place]) 10/29/2016  . Anemia   . Heme positive stool   . Ulceration of intestine   . Benign neoplasm of transverse colon   . Acute gastrointestinal hemorrhage   . Esophageal candidiasis ([place])   . Angiodysplasia of intestinal tract   . Acute respiratory failure with hypoxia ([place]) 07/03/2016  . GI bleeding 06/24/2016  . Rectal bleeding 06/14/2016  . Anemia of chronic disease 06/01/2016  . MRSA carrier 06/01/2016  . Chronic renal failure 05/23/2016  . Ischemic heart disease 05/23/2016  . Angiodysplasia of small intestine   . Melena   . Small bowel bleed not requiring more than 4 units of blood in 24 hours, ICU, or surgery   . Anemia due to chronic blood loss   . Abdominal pain 05/05/2016  . Acute posthemorrhagic anemia 04/17/2016  . Gastrointestinal bleed 04/17/2016  . History of esophagogastroduodenoscopy (EGD) 04/17/2016  . Elevated troponin 04/17/2016  . Alcohol abuse 04/17/2016  . Upper GI bleed 01/19/2016  . Blood in stool   . Angiodysplasia of stomach and duodenum with hemorrhage   . Gastritis   . Reflux esophagitis   . GI bleed 05/16/2015  . Acute GI bleeding   . Symptomatic anemia 04/30/2015  . HTN (hypertension) 04/06/2015  . GERD (gastroesophageal reflux disease) 04/06/2015  .  HLD (hyperlipidemia) 04/06/2015  . Dyspnea 04/06/2015  . Cirrhosis of liver with ascites ([place]) 04/06/2015  . Ascites 04/06/2015  . GIB (gastrointestinal bleeding) 03/23/2015  . Homicidal ideation 06/19/2013  . Suicidal intent 06/19/2013  . Homicidal ideations 06/19/2013  . Hyperkalemia 06/16/2013  . Mandible fracture ([place]) 06/05/2013  . Fracture, mandible ([place]) 06/02/2013  . Coronary atherosclerosis of native coronary artery 06/02/2013  . ESRD on dialysis ([place]) 06/02/2013  . Mandible open fracture ([place]) 06/02/2013     Past Surgical History:  Procedure Laterality Date  . A/V FISTULAGRAM Right 06/06/2017   Procedure: A/V FISTULAGRAM;  Surgeon: Katha Cabal, MD;  Location: [place] CV [facility];  Service: Cardiovascular;  Laterality: Right;  . A/V SHUNT INTERVENTION N/A 06/06/2017   Procedure: A/V SHUNT INTERVENTION;  Surgeon: Katha Cabal, MD;  Location: [place] [facility];  Service: Cardiovascular;  Laterality: N/A;  . AGILE CAPSULE N/A 06/19/2016   Procedure: AGILE CAPSULE;  Surgeon: [name], MD;  Location: [facility] ENDOSCOPY;  Service: Endoscopy;  Laterality: N/A;  . COLONOSCOPY WITH PROPOFOL N/A 06/18/2016   Procedure: COLONOSCOPY WITH PROPOFOL;  Surgeon: [name], MD;  Location: [facility] ENDOSCOPY;  Service: Endoscopy;  Laterality: N/A;  . COLONOSCOPY WITH PROPOFOL N/A 08/12/2016   Procedure: COLONOSCOPY WITH PROPOFOL;  Surgeon: [name], MD;  Location: [facility] ENDOSCOPY;  Service: Endoscopy;  Laterality: N/A;  . COLONOSCOPY WITH PROPOFOL N/A 05/05/2017   Procedure: COLONOSCOPY WITH PROPOFOL;  Surgeon: Manya Silvas, MD;  Location: [facility] ENDOSCOPY;  Service: Endoscopy;  Laterality: N/A;  . CORONARY ANGIOPLASTY  ?   PT UNABLE TO TELL IF  BEFORE OR AFTER  CABG  . CORONARY ARTERY BYPASS GRAFT  2008  ([place] , [place])   3 VESSEL  . DIALYSIS FISTULA CREATION  LAST SURGERY  APPOX  2008  . ENTEROSCOPY N/A 05/10/2016   Procedure: ENTEROSCOPY;  Surgeon: Jerene Bears, MD;  Location: [place];  Service: Gastroenterology;  Laterality: N/A;  . ENTEROSCOPY N/A 08/12/2016   Procedure: ENTEROSCOPY;  Surgeon: [name], MD;  Location: [facility] ENDOSCOPY;  Service: Endoscopy;  Laterality: N/A;  . ENTEROSCOPY Left 06/03/2017   Procedure: ENTEROSCOPY;  Surgeon: Virgel Manifold, MD;  Location: [facility] ENDOSCOPY;  Service: Endoscopy;  Laterality: Left;  Procedure date will ultimately depend on when patient is medically optimized before the procedure, pending hemodialysis and blood transfusions etc. Will place on  schedule and change depending on clinical status.   . ENTEROSCOPY N/A 06/05/2017   Procedure: ENTEROSCOPY;  Surgeon: Virgel Manifold, MD;  Location: [facility] ENDOSCOPY;  Service: Endoscopy;  Laterality: N/A;  . ENTEROSCOPY N/A 06/15/2017   Procedure: Push ENTEROSCOPY;  Surgeon: [name], MD;  [facility];  Service: Endoscopy;  Laterality: N/A;  . ESOPHAGOGASTRODUODENOSCOPY N/A 05/07/2015   Procedure: ESOPHAGOGASTRODUODENOSCOPY (EGD);  Surgeon: Hulen Luster, MD;  Location: [facility] ENDOSCOPY;  Service: Endoscopy;  Laterality: N/A;  . ESOPHAGOGASTRODUODENOSCOPY (EGD) WITH PROPOFOL N/A 05/17/2015   Procedure: ESOPHAGOGASTRODUODENOSCOPY (EGD) WITH PROPOFOL;  Surgeon: [name], MD;  Location: [facility] ENDOSCOPY;  Service: Endoscopy;  Laterality: N/A;  . ESOPHAGOGASTRODUODENOSCOPY (EGD) WITH PROPOFOL N/A 01/20/2016   Procedure: ESOPHAGOGASTRODUODENOSCOPY (EGD) WITH PROPOFOL;  Surgeon: [name], MD;  Location: [facility] ENDOSCOPY;  Service: Endoscopy;  Laterality: N/A;  . ESOPHAGOGASTRODUODENOSCOPY (EGD) WITH PROPOFOL N/A 04/17/2016   Procedure: ESOPHAGOGASTRODUODENOSCOPY (EGD) WITH PROPOFOL;  Surgeon: Lin Landsman, MD;  Location: [facility] ENDOSCOPY;  Service: Gastroenterology;  Laterality: N/A;  . ESOPHAGOGASTRODUODENOSCOPY (EGD) WITH PROPOFOL  05/09/2016   Procedure: ESOPHAGOGASTRODUODENOSCOPY (EGD) WITH PROPOFOL;  Surgeon:  Jerene Bears, MD;  Location: [place];  Service: Endoscopy;;  . ESOPHAGOGASTRODUODENOSCOPY (EGD) WITH PROPOFOL N/A 06/16/2016   Procedure: ESOPHAGOGASTRODUODENOSCOPY (EGD) WITH PROPOFOL;  Surgeon: [name], MD;  Location: [facility] ENDOSCOPY;  Service: Endoscopy;  Laterality: N/A;  . ESOPHAGOGASTRODUODENOSCOPY (EGD) WITH PROPOFOL N/A 05/05/2017   Procedure: ESOPHAGOGASTRODUODENOSCOPY (EGD) WITH PROPOFOL;  Surgeon: Manya Silvas, MD;  Location: [facility] ENDOSCOPY;  Service: Endoscopy;  Laterality: N/A;  . ESOPHAGOGASTRODUODENOSCOPY (EGD) WITH PROPOFOL N/A 06/15/2017   Procedure:  ESOPHAGOGASTRODUODENOSCOPY (EGD) WITH PROPOFOL;  Surgeon: [name], MD;  Location: [facility] ENDOSCOPY;  Service: Endoscopy;  Laterality: N/A;  . GIVENS CAPSULE STUDY N/A 05/07/2016   Procedure: GIVENS CAPSULE STUDY;  Surgeon: Doran Stabler, MD;  Location: [place];  Service: Endoscopy;  Laterality: N/A;  . MANDIBULAR HARDWARE REMOVAL N/A 07/29/2013   Procedure: REMOVAL OF ARCH BARS;  Surgeon: Theodoro Kos, DO;  Location: [place];  Service: Plastics;  Laterality: N/A;  . ORIF MANDIBULAR FRACTURE N/A 06/05/2013   Procedure: REPAIR OF MANDIBULAR FRACTURE x 2 with maxillo-mandibular fixation ;  Surgeon: Theodoro Kos, DO;  Location: [place];  Service: Plastics;  Laterality: N/A;  . PERIPHERAL ARTERIAL STENT GRAFT Left     Prior to Admission medications   Medication Sig Start Date End Date Taking? Authorizing Provider  albuterol (PROVENTIL HFA;VENTOLIN HFA) 108 (90 Base) MCG/ACT inhaler Inhale 2 puffs into the lungs every 6 (six) hours as needed for wheezing or shortness of breath. 04/21/17   Lisa Roca, MD  alum & mag hydroxide-simeth (MAALOX/MYLANTA) 200-200-20 MG/5ML suspension Take 15 mLs every 4 (four) hours as needed by mouth for indigestion or heartburn. Patient not taking: Reported on 04/19/2017 01/25/17   Demetrios Loll, MD  atorvastatin (LIPITOR) 40 MG tablet Take 1 tablet (40 mg total) by mouth daily. 11/16/16   Vaughan Basta, MD  budesonide-formoterol (SYMBICORT) 160-4.5 MCG/ACT inhaler Inhale 2 puffs into the lungs daily. 11/16/16   Vaughan Basta, MD  calcium acetate (PHOSLO) 667 MG capsule Take 2,001 mg by mouth 3 (three) times daily.    [provider]  doxycycline (VIBRA-TABS) 100 MG tablet Take 1 tablet (100 mg total) by mouth 2 (two) times daily. 07/10/17   Harvest Dark, MD  ferrous sulfate 325 (65 FE) MG tablet Take 1 tablet by mouth 3 (three) times daily. 01/21/17   [provider]  furosemide (LASIX) 80 MG tablet Take 1 tablet  (80 mg total) daily by mouth. 01/25/17   Demetrios Loll, MD  gabapentin (NEURONTIN) 300 MG capsule Take 1 capsule by mouth daily as directed 08/16/16   [provider]  ipratropium-albuterol (DUONEB) 0.5-2.5 (3) MG/3ML SOLN Take 3 mLs by nebulization every 6 (six) hours as needed. 11/28/16   Loletha Grayer, MD  labetalol (NORMODYNE) 100 MG tablet Take 100 mg by mouth daily. 03/24/17   [provider]  Multiple Vitamins-Minerals-FA (DIALYVITE SUPREME D) 3 MG TABS Take 1 tablet (3 mg total) by mouth daily. 11/28/16   Loletha Grayer, MD  nitroGLYCERIN (NITROSTAT) 0.4 MG SL tablet Place 1 tablet (0.4 mg total) under the tongue every 5 (five) minutes as needed. 04/12/16   Wende Bushy, MD  pantoprazole (PROTONIX) 40 MG tablet Take 1 tablet (40 mg total) by mouth daily. 11/28/16   Loletha Grayer, MD  Spacer/Aero Chamber Mouthpiece MISC 1 Units by Does not apply route every 4 (four) hours as needed (wheezing). 02/19/17   Darel Hong, MD  tiotropium (SPIRIVA HANDIHALER) 18 MCG inhalation capsule Place 1 capsule (18 mcg total)  into inhaler and inhale daily. 11/16/16 11/16/17  Vaughan Basta, MD    Allergies Patient has no known allergies.  Family History  Problem Relation Age of Onset  . Colon cancer Mother   . Cancer Father   . Cancer Sister   . Kidney disease Brother     Social History Social History   Tobacco Use  . Smoking status: Current Every Day Smoker    Packs/day: 0.15    Years: 40.00    Pack years: 6.00    Types: Cigarettes  . Smokeless tobacco: Never Used  Substance Use Topics  . Alcohol use: No    Comment: pt reports quitting after learning about cirrhosis  . Drug use: No    Frequency: 7.0 times per week    Types: Marijuana, Cocaine    Review of Systems  Constitutional: No fever/chills Eyes: No visual changes. ENT: No sore throat. Cardiovascular: Denies chest pain. Respiratory: Denies shortness of breath. Gastrointestinal: No abdominal pain.  No  nausea, no vomiting.  No diarrhea.  No constipation. Genitourinary: Negative for dysuria. Musculoskeletal: Negative for back pain. Skin: Negative for rash. Neurological: Negative for headaches, focal weakness or numbness.   ____________________________________________   PHYSICAL EXAM:  VITAL SIGNS: ED Triage Vitals  Enc Vitals Group     BP 07/21/17 1116 126/88     Pulse Rate 07/21/17 1116 97     Resp 07/21/17 1116 16     Temp 07/21/17 1116 97.9 F (36.6 C)     Temp Source 07/21/17 1116 Oral     SpO2 07/21/17 1116 100 %     Weight --      Height --      Head Circumference --      Peak Flow --      Pain Score 07/21/17 1117 0     Pain Loc --      Pain Edu? --      Excl. in [place]? --     Constitutional: Alert and oriented. Well appearing and in no acute distress. Eyes: Conjunctivae are normal.  Head: Atraumatic. Nose: No congestion/rhinnorhea. Mouth/Throat: Mucous membranes are moist.  Neck: No stridor.   Cardiovascular: Normal rate, regular rhythm. Grossly normal heart sounds.  Right-sided chest permacath with incisions CDI. Respiratory: Normal respiratory effort.  No retractions. Lungs CTAB. Gastrointestinal: Soft and nontender. No distention.  Musculoskeletal: No lower extremity tenderness nor edema.  No joint effusions. Neurologic:  Normal speech and language. No gross focal neurologic deficits are appreciated. Skin:  Skin is warm, dry and intact. No rash noted. Psychiatric: Mood and affect are normal. Speech and behavior are normal.  ____________________________________________   LABS (all labs ordered are listed, but only abnormal results are displayed)  Labs Reviewed  RENAL FUNCTION PANEL  CBC  PHOSPHORUS   ____________________________________________  EKG   ____________________________________________  RADIOLOGY   ____________________________________________   PROCEDURES  Procedure(s) performed:   Procedures  Critical Care performed:    ____________________________________________   INITIAL IMPRESSION / ASSESSMENT AND PLAN / ED COURSE  Pertinent labs & imaging results that were available during my care of the patient were reviewed by me and considered in my medical decision making (see chart for details).  DDX: Visit for hemodialysis, hyperkalemia, electrolyte abnormality, chronic kidney disease As part of my medical decision making, I reviewed the following data within the [facility] Notes from prior ED visits   ____________________________________________   FINAL   Visit for hemodialysis.  NEW MEDICATIONS STARTED DURING THIS VISIT:  New  Prescriptions   No medications on file     Note:  This document was prepared using Dragon voice recognition software and may include unintentional dictation errors.     Orbie Pyo, MD 07/21/17 1136

## 2017-07-21 NOTE — ED Triage Notes (Signed)
Pt here for dialysis. Denies any problems at this time. Pt is in NAD

## 2017-07-21 NOTE — Progress Notes (Signed)
Post HD assessment. Pt tolerated tx well without c/o or complications. Net UF 3064, goal met.    07/21/17 1537  Vital Signs  Temp 98.3 F (36.8 C)  Temp Source Oral  Pulse Rate (!) 105  Pulse Rate Source Monitor  Resp 20  BP 140/76  BP Location Left Arm  BP Method Automatic  Patient Position (if appropriate) Lying  Oxygen Therapy  SpO2 100 %  O2 Device Room Air  Dialysis Weight  Weight  (no scale available, unable to weigh pt )  Type of Weight Post-Dialysis  Post-Hemodialysis Assessment  Rinseback Volume (mL) 250 mL  KECN 83.1 V  Dialyzer Clearance Lightly streaked  Duration of HD Treatment -hour(s) 3.5 hour(s)  Hemodialysis Intake (mL) 500 mL  UF Total -Machine (mL) 3564 mL  Net UF (mL) 3064 mL  Tolerated HD Treatment Yes  Education / Care Plan  Dialysis Education Provided Yes  Documented Education in Care Plan Yes  Hemodialysis Catheter Right Internal jugular Double-lumen  No Placement Date or Time found.   Placed prior to admission: Yes  Orientation: Right  Access Location: Internal jugular  Hemodialysis Catheter Type: Double-lumen  Site Condition No complications  Blue Lumen Status Heparin locked  Red Lumen Status Heparin locked  Purple Lumen Status N/A  Catheter fill solution Heparin 1000 units/ml  Catheter fill volume (Arterial) 1.5 cc  Catheter fill volume (Venous) 1.5  Dressing Type Biopatch  Dressing Status Clean;Dry;Intact  Drainage Description None  Post treatment catheter status Capped and Clamped   

## 2017-07-21 NOTE — Progress Notes (Signed)
HD tx end   07/21/17 1531  Vital Signs  Pulse Rate (!) 104  Pulse Rate Source Monitor  Resp 20  BP (!) 145/80  BP Location Left Arm  BP Method Automatic  Patient Position (if appropriate) Lying  Oxygen Therapy  SpO2 100 %  O2 Device Room Air  During Hemodialysis Assessment  Dialysis Fluid Bolus Normal Saline  Bolus Amount (mL) 250 mL  Intra-Hemodialysis Comments Tx completed

## 2017-07-21 NOTE — Progress Notes (Signed)
Pre HD assessment    07/21/17 1140  Neurological  Level of Consciousness Alert  Orientation Level Oriented X4  Respiratory  Respiratory Pattern Regular;Unlabored  Chest Assessment Chest expansion symmetrical  Cough Non-productive  Cardiac  ECG Monitor Yes  Vascular  R Radial Pulse +2  L Radial Pulse +2  Edema Generalized  Integumentary  Integumentary (WDL) X  Skin Color Appropriate for ethnicity  Musculoskeletal  Musculoskeletal (WDL) X  Generalized Weakness Yes  Assistive Device None  GU Assessment  Genitourinary (WDL) X  Genitourinary Symptoms  (HD)  Psychosocial  Psychosocial (WDL) WDL

## 2017-07-21 NOTE — Discharge Instructions (Addendum)
Return to the ER for shortness of breath, weakness, chest pain, or any new or worsening symptoms that concern you.

## 2017-07-21 NOTE — ED Provider Notes (Signed)
-----------------------------------------   4:08 PM on 07/21/2017 -----------------------------------------  The patient has returned from dialysis.  He is asymptomatic and states that he would like to go home.  Vital signs are stable.  He is appropriate for discharge at this time.   Arta Silence, MD 07/21/17 [phone]

## 2017-07-21 NOTE — Progress Notes (Signed)
Pre HD assessment   07/21/17 1139  Vital Signs  Temp (!) 97.4 F (36.3 C)  Temp Source Oral  Pulse Rate 96  Pulse Rate Source Monitor  Resp (!) 23  BP 139/87  BP Location Left Arm  BP Method Automatic  Patient Position (if appropriate) Lying  Oxygen Therapy  SpO2 100 %  O2 Device Room Air  Pain Assessment  Pain Scale 0-10  Pain Score 0  Dialysis Weight  Weight  (no scale available, unable to weigh pt)  Type of Weight Pre-Dialysis  Time-Out for Hemodialysis  What Procedure? HD  Pt Identifiers(min of two) First/Last Name;MRN/Account#  Correct Site? Yes  Correct Side? Yes  Correct Procedure? Yes  Consents Verified? Yes  Rad Studies Available? N/A  Safety Precautions Reviewed? Yes  Engineer, civil (consulting) Number  ([id number])  Station Number 3  UF/Alarm Test Passed  Conductivity: Meter 13.6  Conductivity: Machine  13.8  pH 7.4  Reverse Osmosis main  Normal Saline Lot Number [id number]  Dialyzer Lot Number [id number]  Disposable Set Lot Number [id number]  Machine Temperature 98.6 F (37 C)  Musician and Audible Yes  Blood Lines Intact and Secured Yes  Pre Treatment Patient Checks  Vascular access used during treatment Catheter  Hepatitis B Surface Antigen Results Negative  Date Hepatitis B Surface Antigen Drawn 06/16/17  Hepatitis B Surface Antibody 50  Date Hepatitis B Surface Antibody Drawn 06/16/17  Hemodialysis Consent Verified Yes  Hemodialysis Standing Orders Initiated Yes  ECG (Telemetry) Monitor On Yes  Prime Ordered Normal Saline  Length of  DialysisTreatment -hour(s) 3.5 Hour(s)  Dialyzer Elisio 17H NR  Dialysate 2K, 2.5 Ca  Dialysis Anticoagulant None  Dialysate Flow Ordered 800  Blood Flow Rate Ordered 400 mL/min  Ultrafiltration Goal 2.5 Liters  Pre Treatment Labs Phosphorus;Renal panel (potassium )  Dialysis Blood Pressure Support Ordered Normal Saline  Education / Care Plan  Dialysis Education Provided Yes  Documented Education in Care Plan Yes   Hemodialysis Catheter Right Internal jugular Double-lumen  No Placement Date or Time found.   Placed prior to admission: Yes  Orientation: Right  Access Location: Internal jugular  Hemodialysis Catheter Type: Double-lumen  Site Condition No complications  Blue Lumen Status Heparin locked  Red Lumen Status Heparin locked  Purple Lumen Status N/A  Dressing Type Biopatch  Dressing Status Clean;Dry;Intact  Drainage Description None  Dressing Change Due 07/21/17

## 2017-07-21 NOTE — Progress Notes (Signed)
Post HD assessment    07/21/17 1541  Neurological  Level of Consciousness Alert  Orientation Level Oriented X4  Respiratory  Respiratory Pattern Regular;Unlabored  Chest Assessment Chest expansion symmetrical  Cough Non-productive  Cardiac  ECG Monitor Yes  Vascular  R Radial Pulse +2  L Radial Pulse +2  Edema Generalized  Integumentary  Integumentary (WDL) X  Skin Color Appropriate for ethnicity  Musculoskeletal  Musculoskeletal (WDL) X  Generalized Weakness Yes  Assistive Device None  GU Assessment  Genitourinary (WDL) X  Genitourinary Symptoms  (HD)  Psychosocial  Psychosocial (WDL) WDL

## 2017-07-21 NOTE — Progress Notes (Signed)
HD tx start    07/21/17 1144  Vital Signs  Pulse Rate 95  Pulse Rate Source Monitor  Resp (!) 34  BP (!) 132/94  BP Location Left Arm  BP Method Automatic  Patient Position (if appropriate) Lying  Oxygen Therapy  SpO2 100 %  O2 Device Room Air  During Hemodialysis Assessment  Blood Flow Rate (mL/min) 400 mL/min  Arterial Pressure (mmHg) -140 mmHg  Venous Pressure (mmHg) 160 mmHg  Transmembrane Pressure (mmHg) 80 mmHg  Ultrafiltration Rate (mL/min) 1000 mL/min  Dialysate Flow Rate (mL/min) 800 ml/min  Conductivity: Machine  15.2  HD Safety Checks Performed Yes  Dialysis Fluid Bolus Normal Saline  Bolus Amount (mL) 250 mL  Intra-Hemodialysis Comments Tx initiated  Hemodialysis Catheter Right Internal jugular Double-lumen  No Placement Date or Time found.   Placed prior to admission: Yes  Orientation: Right  Access Location: Internal jugular  Hemodialysis Catheter Type: Double-lumen  Blue Lumen Status Infusing  Red Lumen Status Infusing

## 2017-07-24 ENCOUNTER — Emergency Department
Admission: EM | Admit: 2017-07-24 | Discharge: 2017-07-24 | Disposition: A | Discharge status: Home / Self Care | Payer: Medicare Other | Attending: Emergency Medicine | Admitting: Emergency Medicine

## 2017-07-24 ENCOUNTER — Encounter: Payer: Self-pay | Admitting: Emergency Medicine

## 2017-07-24 ENCOUNTER — Non-Acute Institutional Stay: Payer: Medicare Other

## 2017-07-24 DIAGNOSIS — I130 Hypertensive heart and chronic kidney disease with heart failure and stage 1 through stage 4 chronic kidney disease, or unspecified chronic kidney disease: Secondary | ICD-10-CM | POA: Insufficient documentation

## 2017-07-24 DIAGNOSIS — I2510 Atherosclerotic heart disease of native coronary artery without angina pectoris: Secondary | ICD-10-CM | POA: Diagnosis not present

## 2017-07-24 DIAGNOSIS — I120 Hypertensive chronic kidney disease with stage 5 chronic kidney disease or end stage renal disease: Secondary | ICD-10-CM | POA: Diagnosis not present

## 2017-07-24 DIAGNOSIS — D631 Anemia in chronic kidney disease: Secondary | ICD-10-CM | POA: Diagnosis not present

## 2017-07-24 DIAGNOSIS — R140 Abdominal distension (gaseous): Secondary | ICD-10-CM

## 2017-07-24 DIAGNOSIS — Z992 Dependence on renal dialysis: Secondary | ICD-10-CM

## 2017-07-24 DIAGNOSIS — R188 Other ascites: Secondary | ICD-10-CM | POA: Insufficient documentation

## 2017-07-24 DIAGNOSIS — E875 Hyperkalemia: Secondary | ICD-10-CM | POA: Diagnosis not present

## 2017-07-24 DIAGNOSIS — N186 End stage renal disease: Secondary | ICD-10-CM | POA: Diagnosis not present

## 2017-07-24 DIAGNOSIS — Z79899 Other long term (current) drug therapy: Secondary | ICD-10-CM | POA: Diagnosis not present

## 2017-07-24 DIAGNOSIS — I509 Heart failure, unspecified: Secondary | ICD-10-CM | POA: Diagnosis not present

## 2017-07-24 DIAGNOSIS — F17210 Nicotine dependence, cigarettes, uncomplicated: Secondary | ICD-10-CM | POA: Diagnosis not present

## 2017-07-24 DIAGNOSIS — K7460 Unspecified cirrhosis of liver: Secondary | ICD-10-CM | POA: Diagnosis not present

## 2017-07-24 LAB — CBC
HCT: 24.7 % — ABNORMAL LOW (ref 40.0–52.0)
HEMOGLOBIN: 7.6 g/dL — AB (ref 13.0–18.0)
MCH: 25.3 pg — AB (ref 26.0–34.0)
MCHC: 30.7 g/dL — ABNORMAL LOW (ref 32.0–36.0)
MCV: 82.4 fL (ref 80.0–100.0)
Platelets: 351 10*3/uL (ref 150–440)
RBC: 3 MIL/uL — AB (ref 4.40–5.90)
RDW: 17.9 % — ABNORMAL HIGH (ref 11.5–14.5)
WBC: 7.1 10*3/uL (ref 3.8–10.6)

## 2017-07-24 LAB — RENAL FUNCTION PANEL
ANION GAP: 13 (ref 5–15)
Albumin: 2.7 g/dL — ABNORMAL LOW (ref 3.5–5.0)
BUN: 57 mg/dL — ABNORMAL HIGH (ref 6–20)
CALCIUM: 8.7 mg/dL — AB (ref 8.9–10.3)
CHLORIDE: 91 mmol/L — AB (ref 101–111)
CO2: 35 mmol/L — AB (ref 22–32)
Creatinine, Ser: 7.95 mg/dL — ABNORMAL HIGH (ref 0.61–1.24)
GFR calc non Af Amer: 7 mL/min — ABNORMAL LOW (ref >60)
GFR, EST AFRICAN AMERICAN: 8 mL/min — AB (ref >60)
Glucose, Bld: 125 mg/dL — ABNORMAL HIGH (ref 65–99)
Phosphorus: 6.5 mg/dL — ABNORMAL HIGH (ref 2.5–4.6)
Potassium: 6.3 mmol/L (ref 3.5–5.1)
Sodium: 139 mmol/L (ref 135–145)

## 2017-07-24 MED ORDER — ALBUMIN HUMAN 25 % IV SOLN
25.0000 g | Freq: Once | INTRAVENOUS | Status: AC
  Administered 2017-07-24: 25 g via INTRAVENOUS
  Filled 2017-07-24: qty 100

## 2017-07-24 MED ORDER — EPOETIN ALFA 10000 UNIT/ML IJ SOLN
10000.0000 [IU] | Freq: Once | INTRAMUSCULAR | Status: AC
  Administered 2017-07-24: 10000 [IU] via INTRAVENOUS
  Filled 2017-07-24: qty 1

## 2017-07-24 MED ORDER — DIPHENHYDRAMINE HCL 50 MG/ML IJ SOLN
25.0000 mg | Freq: Once | INTRAMUSCULAR | Status: AC
  Administered 2017-07-24: 25 mg via INTRAVENOUS

## 2017-07-24 MED ORDER — NEPRO/CARBSTEADY PO LIQD: 237.0000 mL | ORAL | Status: DC

## 2017-07-24 NOTE — Procedures (Signed)
US guided paracentesis.  Removed 5.8 liters without complication.  Minimal blood loss and no immediate complication.

## 2017-07-24 NOTE — Progress Notes (Signed)
Pre HD assessment   07/24/17 1137  Vital Signs  Temp 97.8 F (36.6 C)  Temp Source Oral  Pulse Rate 88  Pulse Rate Source Monitor  Resp 19  BP 118/82  BP Location Left Arm  BP Method Automatic  Patient Position (if appropriate) Lying  Oxygen Therapy  SpO2 100 %  O2 Device Room Air  Pain Assessment  Pain Scale 0-10  Pain Score 0  Dialysis Weight  Weight  (unable to weigh pt, in chair, no scale available)  Type of Weight Pre-Dialysis  Time-Out for Hemodialysis  What Procedure? HD  Pt Identifiers(min of two) First/Last Name;MRN/Account#  Correct Site? Yes  Correct Side? Yes  Correct Procedure? Yes  Consents Verified? Yes  Rad Studies Available? N/A  Safety Precautions Reviewed? Yes  Engineer, civil (consulting) Number  ([id number])  Station Number 3  UF/Alarm Test Passed  Conductivity: Meter 13.8  Conductivity: Machine  14.2  pH 7.6  Reverse Osmosis main  Normal Saline Lot Number [id number]  Dialyzer Lot Number [id number]  Disposable Set Lot Number [id number]  Machine Temperature 98.6 F (37 C)  Musician and Audible Yes  Blood Lines Intact and Secured Yes  Pre Treatment Patient Checks  Vascular access used during treatment Catheter  Patient is receiving dialysis in a chair Yes  Hepatitis B Surface Antigen Results Negative  Date Hepatitis B Surface Antigen Drawn 06/16/17  Hepatitis B Surface Antibody  (>10)  Date Hepatitis B Surface Antibody Drawn 03/01/17  Hemodialysis Consent Verified Yes  Hemodialysis Standing Orders Initiated Yes  ECG (Telemetry) Monitor On Yes  Prime Ordered Normal Saline  Length of  DialysisTreatment -hour(s) 3.5 Hour(s)  Dialyzer Elisio 17H NR  Dialysate 2K, 2.5 Ca  Dialysis Anticoagulant None  Dialysate Flow Ordered 600  Blood Flow Rate Ordered 400 mL/min  Ultrafiltration Goal 3 Liters  Pre Treatment Labs Renal panel;CBC  Dialysis Blood Pressure Support Ordered Normal Saline  Education / Care Plan  Dialysis Education Provided Yes   Documented Education in Care Plan Yes  Hemodialysis Catheter Right Internal jugular Double-lumen  No Placement Date or Time found.   Placed prior to admission: Yes  Orientation: Right  Access Location: Internal jugular  Hemodialysis Catheter Type: Double-lumen  Site Condition No complications  Blue Lumen Status Heparin locked  Red Lumen Status Heparin locked  Purple Lumen Status N/A  Dressing Type Biopatch  Dressing Status Clean;Dry;Intact  Drainage Description None

## 2017-07-24 NOTE — Progress Notes (Signed)
Post HD assessment    07/24/17 1535  Neurological  Level of Consciousness Alert  Orientation Level Oriented X4  Respiratory  Respiratory Pattern Regular;Unlabored  Chest Assessment Chest expansion symmetrical  Cough Non-productive  Cardiac  ECG Monitor Yes  Vascular  R Radial Pulse +2  L Radial Pulse +2  Edema Generalized  Integumentary  Integumentary (WDL) X  Skin Color Appropriate for ethnicity  Musculoskeletal  Musculoskeletal (WDL) X  Generalized Weakness Yes  Assistive Device None  GU Assessment  Genitourinary (WDL) X  Genitourinary Symptoms  (HD)  Psychosocial  Psychosocial (WDL) WDL

## 2017-07-24 NOTE — Progress Notes (Signed)
[facility]  ROUNDING NOTE   Subjective:    Seen and examined during hemodialysis treatment. Tolerating treatment well. 1K bath. UF goal of 3.5 liters.     HEMODIALYSIS FLOWSHEET:  Blood Flow Rate (mL/min): 400 mL/min Arterial Pressure (mmHg): -170 mmHg Venous Pressure (mmHg): 140 mmHg Transmembrane Pressure (mmHg): 70 mmHg Ultrafiltration Rate (mL/min): 1000 mL/min Dialysate Flow Rate (mL/min): 600 ml/min Conductivity: Machine : 14.1 Conductivity: Machine : 14.1 Dialysis Fluid Bolus: Normal Saline Bolus Amount (mL): 250 mL Dialysate Change: 1K     Objective:  Vital signs in last 24 hours:  Temp:  [97.6 F (36.4 C)-97.8 F (36.6 C)] 97.8 F (36.6 C) (05/13 1137) Pulse Rate:  [77-95] 95 (05/13 1415) Resp:  [13-25] 16 (05/13 1415) BP: (113-149)/(67-113) 149/113 (05/13 1415) SpO2:  [90 %-100 %] 99 % (05/13 1415)  Weight change:  Filed Weights    Intake/Output: No intake/output data recorded.   Intake/Output this shift:  No intake/output data recorded.  Physical Exam: General: NAD, sitting in chair  Head: Normocephalic, atraumatic. Moist oral mucosal membranes  Eyes: Anicteric  Neck: Supple  Lungs:  normal effort, b/l mild crackles  Heart: Regular rate and rhythm  Abdomen:  Soft, nontender, distended  Extremities:  + peripheral edema.  Neurologic: Nonfocal, moving all four extremities  Skin: No lesions  Access: RUE AVF - no bruit or thrill, RIJ permcath    Basic Metabolic Panel: Recent Labs  Lab 07/21/17 1220 07/24/17 1211  NA 139 139  K 5.8* 6.3*  CL 96* 91*  CO2 35* 35*  GLUCOSE 114* 125*  BUN 56* 57*  CREATININE 4.55* 7.95*  CALCIUM 8.4* 8.7*  PHOS 3.0 6.5*    Liver Function Tests: Recent Labs  Lab 07/21/17 1220 07/24/17 1211  ALBUMIN 2.6* 2.7*   No results for input(s): LIPASE, AMYLASE in the last 168 hours. No results for input(s): AMMONIA in the last 168 hours.  CBC: Recent Labs  Lab 07/21/17 1220 07/24/17 1211   WBC 6.8 7.1  HGB 7.4* 7.6*  HCT 23.8* 24.7*  MCV 82.5 82.4  PLT 298 351    Cardiac Enzymes: No results for input(s): CKTOTAL, CKMB, CKMBINDEX, TROPONINI in the last 168 hours.  BNP: Invalid input(s): POCBNP  CBG: No results for input(s): GLUCAP in the last 168 hours.  Microbiology: Results for orders placed or performed during the hospital encounter of 07/12/17  Culture, blood (Routine X 2) w Reflex to ID Panel     Status: None   Collection Time: 07/12/17  4:43 PM  Result Value Ref Range Status   Specimen Description BLOOD LINE CENTRAL  Final   Special Requests   Final    BOTTLES DRAWN AEROBIC AND ANAEROBIC Blood Culture adequate volume   Culture   Final    NO GROWTH 5 DAYS Performed at [facility], [address] [place], [place] [address]    Report Status 07/17/2017 FINAL  Final    Coagulation Studies: No results for input(s): LABPROT, INR in the last 72 hours.  Urinalysis: No results for input(s): COLORURINE, LABSPEC, PHURINE, GLUCOSEU, HGBUR, BILIRUBINUR, KETONESUR, PROTEINUR, UROBILINOGEN, NITRITE, LEUKOCYTESUR in the last 72 hours.  Invalid input(s): APPERANCEUR    Imaging: No results found.   Medications:       Assessment/ Plan:  Mr. Jerome Viglione is a 57 y.o. black male withend stage renal disease on hemodialysis secondary to Alport's syndrome, ascites, hypertension, anemia of chronic kidney disease, coronary artery disease, peripheral vascular disease, hyperlipidemia, gastrointestinal AVMs, pulmonary hypertension,  nooutpatientdialysis unit currently  1.  Hyperkalemia Hemodialysis treatment with 1 K    2.  End-stage renal disease: MWF schedule.  3.5 liters  3. Anemia with chronic kidney disease: hemoglobin 7.6 EPO with HD  4. Dialysis device complication: AVF nonfunction. Currently using RIJ permcath Needs evaluation for a new access. Patient to f/u with outpatient vascular office  5. Hypertension: elevated during  treatment.  - furosemide and labetalol  6. Ascites: - Schedule ultrasound guided large volume paracentesis with IV albumin   LOS: 0 Stanley [name] 5/13/20192:19 PM

## 2017-07-24 NOTE — Progress Notes (Signed)
Post HD assessment. Pt tolerated tx well without c/o or complications. Net UF 3024, goal met.    07/24/17 1532  Vital Signs  Temp 98.9 F (37.2 C)  Temp Source Oral  Pulse Rate 95  Pulse Rate Source Monitor  Resp 18  BP (!) 138/41  BP Location Left Arm  BP Method Automatic  Patient Position (if appropriate) Lying  Oxygen Therapy  SpO2 96 %  O2 Device Room Air  Dialysis Weight  Weight  (unable to weigh pt, in chair, no scale available)  Type of Weight Post-Dialysis  Post-Hemodialysis Assessment  Rinseback Volume (mL) 250 mL  KECN 79.6 V  Dialyzer Clearance Lightly streaked  Duration of HD Treatment -hour(s) 3.5 hour(s)  Hemodialysis Intake (mL) 500 mL  UF Total -Machine (mL) 3524 mL  Net UF (mL) 3024 mL  Tolerated HD Treatment Yes  Education / Care Plan  Dialysis Education Provided Yes  Documented Education in Care Plan Yes  Hemodialysis Catheter Right Internal jugular Double-lumen  No Placement Date or Time found.   Placed prior to admission: Yes  Orientation: Right  Access Location: Internal jugular  Hemodialysis Catheter Type: Double-lumen  Site Condition No complications  Blue Lumen Status Heparin locked  Red Lumen Status Heparin locked  Purple Lumen Status N/A  Catheter fill solution Heparin 1000 units/ml  Catheter fill volume (Arterial) 1.5 cc  Catheter fill volume (Venous) 1.5  Dressing Type Biopatch  Dressing Status Clean;Dry;Intact  Drainage Description None  Post treatment catheter status Capped and Clamped

## 2017-07-24 NOTE — ED Provider Notes (Signed)
[facility] Emergency Department Provider Note   ____________________________________________    I have reviewed the triage vital signs and the nursing notes.   HISTORY  Chief Complaint Needs dialysis    HPI Stanley [name] is a 57 y.o. male who presents for regular scheduled dialysis.  Patient with history of end-stage renal disease presents to the ED on Monday Wednesday Friday for dialysis.  He has no complaints.  No shortness of breath.   Past Medical History:  Diagnosis Date  . Alcohol abuse   . CHF (congestive heart failure) ([place])   . Cirrhosis ([place])   . Coronary artery disease 2009  . Drug abuse ([place])   . End stage renal disease on dialysis [facility]) NEPHROLOGIST-   DR [facility]  IN [place]   HEMODIALYSIS --   TUES/  THURS/  SAT  . Gastrointestinal bleed 06/13/2017   From chart...hx of multiple GI bleeds  . GERD (gastroesophageal reflux disease)   . Hyperlipidemia   . Hypertension   . PAD (peripheral artery disease) ([place])   . Renal insufficiency    Per pt, 32 oz fluid restriction per day  . S/P triple vessel bypass 06/09/2016   2009ish  . Suicidal ideation    & HOMICIDAL IDEATION --  06-16-2013   ADMITTED TO BEHAVIOR HEALTH    Patient Active Problem List   Diagnosis Date Noted  . Protein-calorie malnutrition, severe 06/14/2017  . Encounter for dialysis [facility])   . Palliative care by specialist   . Goals of care, counseling/discussion   . Malnutrition of moderate degree 06/05/2017  . Secondary esophageal varices without bleeding ([place])   . Stomach irritation   . Idiopathic esophageal varices without bleeding ([place])   . Alcoholic hepatitis with ascites 05/24/2017  . ESRD (end stage renal disease) ([place]) 04/28/2017  . Uremia 03/08/2017  . ESRD on hemodialysis ([place]) 03/03/2017  . Weakness 02/28/2017  . Hypocalcemia 02/22/2017  . Shortness of breath 11/26/2016  . COPD (chronic obstructive pulmonary disease) ([place]) 10/30/2016  . COPD  exacerbation ([place]) 10/29/2016  . Anemia   . Heme positive stool   . Ulceration of intestine   . Benign neoplasm of transverse colon   . Acute gastrointestinal hemorrhage   . Esophageal candidiasis ([place])   . Angiodysplasia of intestinal tract   . Acute respiratory failure with hypoxia ([place]) 07/03/2016  . GI bleeding 06/24/2016  . Rectal bleeding 06/14/2016  . Anemia of chronic disease 06/01/2016  . MRSA carrier 06/01/2016  . Chronic renal failure 05/23/2016  . Ischemic heart disease 05/23/2016  . Angiodysplasia of small intestine   . Melena   . Small bowel bleed not requiring more than 4 units of blood in 24 hours, ICU, or surgery   . Anemia due to chronic blood loss   . Abdominal pain 05/05/2016  . Acute posthemorrhagic anemia 04/17/2016  . Gastrointestinal bleed 04/17/2016  . History of esophagogastroduodenoscopy (EGD) 04/17/2016  . Elevated troponin 04/17/2016  . Alcohol abuse 04/17/2016  . Upper GI bleed 01/19/2016  . Blood in stool   . Angiodysplasia of stomach and duodenum with hemorrhage   . Gastritis   . Reflux esophagitis   . GI bleed 05/16/2015  . Acute GI bleeding   . Symptomatic anemia 04/30/2015  . HTN (hypertension) 04/06/2015  . GERD (gastroesophageal reflux disease) 04/06/2015  . HLD (hyperlipidemia) 04/06/2015  . Dyspnea 04/06/2015  . Cirrhosis of liver with ascites ([place]) 04/06/2015  . Ascites 04/06/2015  . GIB (gastrointestinal bleeding) 03/23/2015  .  Homicidal ideation 06/19/2013  . Suicidal intent 06/19/2013  . Homicidal ideations 06/19/2013  . Hyperkalemia 06/16/2013  . Mandible fracture ([place]) 06/05/2013  . Fracture, mandible ([place]) 06/02/2013  . Coronary atherosclerosis of native coronary artery 06/02/2013  . ESRD on dialysis ([place]) 06/02/2013  . Mandible open fracture ([place]) 06/02/2013    Past Surgical History:  Procedure Laterality Date  . A/V FISTULAGRAM Right 06/06/2017   Procedure: A/V FISTULAGRAM;  Surgeon: Katha Cabal, MD;   Location: [place] CV LAB;  Service: Cardiovascular;  Laterality: Right;  . A/V SHUNT INTERVENTION N/A 06/06/2017   Procedure: A/V SHUNT INTERVENTION;  Surgeon: Katha Cabal, MD;  Location: [place] CV LAB;  Service: Cardiovascular;  Laterality: N/A;  . AGILE CAPSULE N/A 06/19/2016   Procedure: AGILE CAPSULE;  Surgeon: [name], MD;  Location: [facility] ENDOSCOPY;  Service: Endoscopy;  Laterality: N/A;  . COLONOSCOPY WITH PROPOFOL N/A 06/18/2016   Procedure: COLONOSCOPY WITH PROPOFOL;  Surgeon: [name], MD;  Location: [facility] ENDOSCOPY;  Service: Endoscopy;  Laterality: N/A;  . COLONOSCOPY WITH PROPOFOL N/A 08/12/2016   Procedure: COLONOSCOPY WITH PROPOFOL;  Surgeon: [name], MD;  Location: [facility] ENDOSCOPY;  Service: Endoscopy;  Laterality: N/A;  . COLONOSCOPY WITH PROPOFOL N/A 05/05/2017   Procedure: COLONOSCOPY WITH PROPOFOL;  Surgeon: Manya Silvas, MD;  Location: [facility] ENDOSCOPY;  Service: Endoscopy;  Laterality: N/A;  . CORONARY ANGIOPLASTY  ?   PT UNABLE TO TELL IF  BEFORE OR AFTER  CABG  . CORONARY ARTERY BYPASS GRAFT  2008  ([place])   3 VESSEL  . DIALYSIS FISTULA CREATION  LAST SURGERY  APPOX  2008  . ENTEROSCOPY N/A 05/10/2016   Procedure: ENTEROSCOPY;  Surgeon: Jerene Bears, MD;  Location: [place];  Service: Gastroenterology;  Laterality: N/A;  . ENTEROSCOPY N/A 08/12/2016   Procedure: ENTEROSCOPY;  Surgeon: [name], MD;  Location: [facility] ENDOSCOPY;  Service: Endoscopy;  Laterality: N/A;  . ENTEROSCOPY Left 06/03/2017   Procedure: ENTEROSCOPY;  Surgeon: Virgel Manifold, MD;  Location: [facility] ENDOSCOPY;  Service: Endoscopy;  Laterality: Left;  Procedure date will ultimately depend on when patient is medically optimized before the procedure, pending hemodialysis and blood transfusions etc. Will place on schedule and change depending on clinical status.   . ENTEROSCOPY N/A 06/05/2017   Procedure: ENTEROSCOPY;  Surgeon: Virgel Manifold, MD;  Location: [facility]  ENDOSCOPY;  Service: Endoscopy;  Laterality: N/A;  . ENTEROSCOPY N/A 06/15/2017   Procedure: Push ENTEROSCOPY;  Surgeon: [name], MD;  Location: [facility] ENDOSCOPY;  Service: Endoscopy;  Laterality: N/A;  . ESOPHAGOGASTRODUODENOSCOPY N/A 05/07/2015   Procedure: ESOPHAGOGASTRODUODENOSCOPY (EGD);  Surgeon: Hulen Luster, MD;  Location: [facility] ENDOSCOPY;  Service: Endoscopy;  Laterality: N/A;  . ESOPHAGOGASTRODUODENOSCOPY (EGD) WITH PROPOFOL N/A 05/17/2015   Procedure: ESOPHAGOGASTRODUODENOSCOPY (EGD) WITH PROPOFOL;  Surgeon: [name], MD;  Location: [facility] ENDOSCOPY;  Service: Endoscopy;  Laterality: N/A;  . ESOPHAGOGASTRODUODENOSCOPY (EGD) WITH PROPOFOL N/A 01/20/2016   Procedure: ESOPHAGOGASTRODUODENOSCOPY (EGD) WITH PROPOFOL;  Surgeon: [name], MD;  Location: [facility] ENDOSCOPY;  Service: Endoscopy;  Laterality: N/A;  . ESOPHAGOGASTRODUODENOSCOPY (EGD) WITH PROPOFOL N/A 04/17/2016   Procedure: ESOPHAGOGASTRODUODENOSCOPY (EGD) WITH PROPOFOL;  Surgeon: Lin Landsman, MD;  Location: [facility] ENDOSCOPY;  Service: Gastroenterology;  Laterality: N/A;  . ESOPHAGOGASTRODUODENOSCOPY (EGD) WITH PROPOFOL  05/09/2016   Procedure: ESOPHAGOGASTRODUODENOSCOPY (EGD) WITH PROPOFOL;  Surgeon: Jerene Bears, MD;  Location: [place] ENDOSCOPY;  Service: Endoscopy;;  . ESOPHAGOGASTRODUODENOSCOPY (EGD) WITH PROPOFOL N/A 06/16/2016   Procedure: ESOPHAGOGASTRODUODENOSCOPY (EGD) WITH PROPOFOL;  Surgeon:  [name], MD;  Location: [facility] ENDOSCOPY;  Service: Endoscopy;  Laterality: N/A;  . ESOPHAGOGASTRODUODENOSCOPY (EGD) WITH PROPOFOL N/A 05/05/2017   Procedure: ESOPHAGOGASTRODUODENOSCOPY (EGD) WITH PROPOFOL;  Surgeon: Manya Silvas, MD;  Location: [facility] ENDOSCOPY;  Service: Endoscopy;  Laterality: N/A;  . ESOPHAGOGASTRODUODENOSCOPY (EGD) WITH PROPOFOL N/A 06/15/2017   Procedure: ESOPHAGOGASTRODUODENOSCOPY (EGD) WITH PROPOFOL;  Surgeon: [name], MD;  Location: [facility] ENDOSCOPY;  Service: Endoscopy;  Laterality: N/A;  . GIVENS CAPSULE STUDY  N/A 05/07/2016   Procedure: GIVENS CAPSULE STUDY;  Surgeon: Doran Stabler, MD;  Location: [place];  Service: Endoscopy;  Laterality: N/A;  . MANDIBULAR HARDWARE REMOVAL N/A 07/29/2013   Procedure: REMOVAL OF ARCH BARS;  Surgeon: Theodoro Kos, DO;  Location: [place];  Service: Plastics;  Laterality: N/A;  . ORIF MANDIBULAR FRACTURE N/A 06/05/2013   Procedure: REPAIR OF MANDIBULAR FRACTURE x 2 with maxillo-mandibular fixation ;  Surgeon: Theodoro Kos, DO;  Location: [place];  Service: Plastics;  Laterality: N/A;  . PERIPHERAL ARTERIAL STENT GRAFT Left     Prior to Admission medications   Medication Sig Start Date End Date Taking? Authorizing Provider  albuterol (PROVENTIL HFA;VENTOLIN HFA) 108 (90 Base) MCG/ACT inhaler Inhale 2 puffs into the lungs every 6 (six) hours as needed for wheezing or shortness of breath. 04/21/17   Lisa Roca, MD  alum & mag hydroxide-simeth (MAALOX/MYLANTA) 200-200-20 MG/5ML suspension Take 15 mLs every 4 (four) hours as needed by mouth for indigestion or heartburn. Patient not taking: Reported on 04/19/2017 01/25/17   Demetrios Loll, MD  atorvastatin (LIPITOR) 40 MG tablet Take 1 tablet (40 mg total) by mouth daily. 11/16/16   Vaughan Basta, MD  budesonide-formoterol (SYMBICORT) 160-4.5 MCG/ACT inhaler Inhale 2 puffs into the lungs daily. 11/16/16   Vaughan Basta, MD  calcium acetate (PHOSLO) 667 MG capsule Take 2,001 mg by mouth 3 (three) times daily.    [provider]  doxycycline (VIBRA-TABS) 100 MG tablet Take 1 tablet (100 mg total) by mouth 2 (two) times daily. 07/10/17   Harvest Dark, MD  ferrous sulfate 325 (65 FE) MG tablet Take 1 tablet by mouth 3 (three) times daily. 01/21/17   [provider]  furosemide (LASIX) 80 MG tablet Take 1 tablet (80 mg total) daily by mouth. 01/25/17   Demetrios Loll, MD  gabapentin (NEURONTIN) 300 MG capsule Take 1 capsule by mouth daily as directed 08/16/16   [provider]  ipratropium-albuterol (DUONEB) 0.5-2.5 (3) MG/3ML SOLN Take 3 mLs by nebulization every 6 (six) hours as needed. 11/28/16   Loletha Grayer, MD  labetalol (NORMODYNE) 100 MG tablet Take 100 mg by mouth daily. 03/24/17   [provider]  Multiple Vitamins-Minerals-FA (DIALYVITE SUPREME D) 3 MG TABS Take 1 tablet (3 mg total) by mouth daily. 11/28/16   Loletha Grayer, MD  nitroGLYCERIN (NITROSTAT) 0.4 MG SL tablet Place 1 tablet (0.4 mg total) under the tongue every 5 (five) minutes as needed. 04/12/16   Wende Bushy, MD  pantoprazole (PROTONIX) 40 MG tablet Take 1 tablet (40 mg total) by mouth daily. 11/28/16   Loletha Grayer, MD  Spacer/Aero Chamber Mouthpiece MISC 1 Units by Does not apply route every 4 (four) hours as needed (wheezing). 02/19/17   Darel Hong, MD  tiotropium (SPIRIVA HANDIHALER) 18 MCG inhalation capsule Place 1 capsule (18 mcg total) into inhaler and inhale daily. 11/16/16 11/16/17  Vaughan Basta, MD     Allergies Patient has no known allergies.  Family History  Problem Relation Age  of Onset  . Colon cancer Mother   . Cancer Father   . Cancer Sister   . Kidney disease Brother     Social History Social History   Tobacco Use  . Smoking status: Current Every Day Smoker    Packs/day: 0.15    Years: 40.00    Pack years: 6.00    Types: Cigarettes  . Smokeless tobacco: Never Used  Substance Use Topics  . Alcohol use: No    Comment: pt reports quitting after learning about cirrhosis  . Drug use: No    Frequency: 7.0 times per week    Types: Marijuana, Cocaine    Review of Systems  Constitutional: No fever/chills   CV: No palpitations Respiration: No shortness of breath  Gastrointestinal: No abdominal pain.  No nausea, no vomiting.       ____________________________________________   PHYSICAL EXAM:  VITAL SIGNS: ED Triage Vitals  Enc Vitals Group     BP 07/24/17 1109 113/73     Pulse Rate 07/24/17 1109 87      Resp 07/24/17 1109 18     Temp 07/24/17 1109 97.6 F (36.4 C)     Temp Source 07/24/17 1109 Oral     SpO2 07/24/17 1109 93 %     Weight --      Height --      Head Circumference --      Peak Flow --      Pain Score 07/24/17 1108 0     Pain Loc --      Pain Edu? --      Excl. in [place]? --      Constitutional: Alert and oriented. No acute distress. Pleasant and interactive  Cardiovascular: Normal rate, regular rhythm.  Respiratory: Normal respiratory effort.  No retractions.  Clear to auscultation  Neurologic:  Normal speech and language. No gross focal neurologic deficits are appreciated.   Skin:  Skin is warm, dry and intact. No rash noted.   ____________________________________________   LABS (all labs ordered are listed, but only abnormal results are displayed)  Labs Reviewed  RENAL FUNCTION PANEL  CBC   ____________________________________________  EKG   ____________________________________________  RADIOLOGY  None ____________________________________________   PROCEDURES  Procedure(s) performed: No  Procedures   Critical Care performed: No ____________________________________________   INITIAL IMPRESSION / ASSESSMENT AND PLAN / ED COURSE  Pertinent labs & imaging results that were available during my care of the patient were reviewed by me and considered in my medical decision making (see chart for details).  Patient presents for usual dialysis, has no complaints.  ----------------------------------------- 11:50 AM on 07/24/2017 -----------------------------------------  Taken for dialysis   ----------------------------------------- 2:41 PM on 07/24/2017 -----------------------------------------  Patient still on dialysis, have asked my colleague to reevaluate for discharge   ____________________________________________   FINAL CLINICAL IMPRESSION(S) / ED DIAGNOSES  Encounter for dialysis    NEW MEDICATIONS STARTED DURING THIS  VISIT:  Current Discharge Medication List       Note:  This document was prepared using Dragon voice recognition software and may include unintentional dictation errors.    Lavonia Drafts, MD 07/24/17 [phone]

## 2017-07-24 NOTE — Progress Notes (Signed)
Pre HD assessment    07/24/17 1138  Neurological  Level of Consciousness Alert  Orientation Level Oriented X4  Respiratory  Respiratory Pattern Regular;Unlabored  Chest Assessment Chest expansion symmetrical  Cough Non-productive  Cardiac  ECG Monitor Yes  Vascular  R Radial Pulse +2  L Radial Pulse +2  Edema Generalized  Integumentary  Integumentary (WDL) X  Skin Color Appropriate for ethnicity  Musculoskeletal  Musculoskeletal (WDL) X  Generalized Weakness Yes  Assistive Device None  GU Assessment  Genitourinary (WDL) X  Genitourinary Symptoms  (HD)  Psychosocial  Psychosocial (WDL) WDL

## 2017-07-24 NOTE — ED Notes (Signed)
Pt presents for dialysis. NAD noted.

## 2017-07-24 NOTE — Discharge Instructions (Signed)
Please seek medical attention for any high fevers, chest pain, shortness of breath, change in behavior, persistent vomiting, bloody stool or any other new or concerning symptoms.  

## 2017-07-24 NOTE — ED Triage Notes (Signed)
Here for dialysis

## 2017-07-24 NOTE — Progress Notes (Signed)
Lab called with a critical potassium value of 6.3, MD aware.    07/24/17 1252  Vital Signs  Pulse Rate 77  Resp 15  Oxygen Therapy  SpO2 97 %  During Hemodialysis Assessment  Dialysate Change 1K

## 2017-07-24 NOTE — Progress Notes (Signed)
HD tx start    07/24/17 1144  Vital Signs  Pulse Rate 84  Pulse Rate Source Monitor  Resp (!) 25  BP 138/83  BP Location Left Arm  BP Method Automatic  Patient Position (if appropriate) Lying  Oxygen Therapy  SpO2 96 %  O2 Device Room Air  During Hemodialysis Assessment  Blood Flow Rate (mL/min) 400 mL/min  Arterial Pressure (mmHg) -170 mmHg  Venous Pressure (mmHg) 140 mmHg  Transmembrane Pressure (mmHg) 70 mmHg  Ultrafiltration Rate (mL/min) 1000 mL/min  Dialysate Flow Rate (mL/min) 600 ml/min  Conductivity: Machine  14.3  HD Safety Checks Performed Yes  Dialysis Fluid Bolus Normal Saline  Bolus Amount (mL) 250 mL  Intra-Hemodialysis Comments Tx initiated  Hemodialysis Catheter Right Internal jugular Double-lumen  No Placement Date or Time found.   Placed prior to admission: Yes  Orientation: Right  Access Location: Internal jugular  Hemodialysis Catheter Type: Double-lumen  Blue Lumen Status Infusing  Red Lumen Status Infusing

## 2017-07-24 NOTE — ED Notes (Signed)
Pt discharged home after verbalizing understanding of discharge instructions; nad noted. 

## 2017-07-24 NOTE — Progress Notes (Signed)
HD tx end   07/24/17 1524  Vital Signs  Pulse Rate 95  Pulse Rate Source Monitor  Resp 20  BP 138/67  BP Location Left Arm  BP Method Automatic  Patient Position (if appropriate) Lying  Oxygen Therapy  SpO2 97 %  O2 Device Room Air  During Hemodialysis Assessment  Dialysis Fluid Bolus Normal Saline  Bolus Amount (mL) 250 mL  Intra-Hemodialysis Comments Tx completed

## 2017-07-26 ENCOUNTER — Other Ambulatory Visit: Payer: Self-pay

## 2017-07-26 ENCOUNTER — Non-Acute Institutional Stay
Admission: EM | Admit: 2017-07-26 | Discharge: 2017-07-27 | Disposition: A | Discharge status: Home / Self Care | Payer: Medicare Other | Attending: Emergency Medicine | Admitting: Emergency Medicine

## 2017-07-26 ENCOUNTER — Encounter: Payer: Self-pay | Admitting: Emergency Medicine

## 2017-07-26 DIAGNOSIS — Z7951 Long term (current) use of inhaled steroids: Secondary | ICD-10-CM | POA: Diagnosis not present

## 2017-07-26 DIAGNOSIS — I132 Hypertensive heart and chronic kidney disease with heart failure and with stage 5 chronic kidney disease, or end stage renal disease: Secondary | ICD-10-CM | POA: Insufficient documentation

## 2017-07-26 DIAGNOSIS — I120 Hypertensive chronic kidney disease with stage 5 chronic kidney disease or end stage renal disease: Secondary | ICD-10-CM | POA: Diagnosis not present

## 2017-07-26 DIAGNOSIS — D631 Anemia in chronic kidney disease: Secondary | ICD-10-CM | POA: Insufficient documentation

## 2017-07-26 DIAGNOSIS — E875 Hyperkalemia: Secondary | ICD-10-CM | POA: Insufficient documentation

## 2017-07-26 DIAGNOSIS — J449 Chronic obstructive pulmonary disease, unspecified: Secondary | ICD-10-CM | POA: Insufficient documentation

## 2017-07-26 DIAGNOSIS — Z22322 Carrier or suspected carrier of Methicillin resistant Staphylococcus aureus: Secondary | ICD-10-CM | POA: Diagnosis not present

## 2017-07-26 DIAGNOSIS — E785 Hyperlipidemia, unspecified: Secondary | ICD-10-CM | POA: Diagnosis not present

## 2017-07-26 DIAGNOSIS — Z79899 Other long term (current) drug therapy: Secondary | ICD-10-CM | POA: Insufficient documentation

## 2017-07-26 DIAGNOSIS — T82590A Other mechanical complication of surgically created arteriovenous fistula, initial encounter: Secondary | ICD-10-CM | POA: Insufficient documentation

## 2017-07-26 DIAGNOSIS — Z4931 Encounter for adequacy testing for hemodialysis: Secondary | ICD-10-CM | POA: Diagnosis not present

## 2017-07-26 DIAGNOSIS — N186 End stage renal disease: Secondary | ICD-10-CM | POA: Diagnosis not present

## 2017-07-26 DIAGNOSIS — F17210 Nicotine dependence, cigarettes, uncomplicated: Secondary | ICD-10-CM | POA: Diagnosis not present

## 2017-07-26 DIAGNOSIS — Z992 Dependence on renal dialysis: Secondary | ICD-10-CM

## 2017-07-26 DIAGNOSIS — K7010 Alcoholic hepatitis without ascites: Secondary | ICD-10-CM | POA: Insufficient documentation

## 2017-07-26 MED ORDER — EPOETIN ALFA 10000 UNIT/ML IJ SOLN
10000.0000 [IU] | Freq: Once | INTRAMUSCULAR | Status: AC
  Administered 2017-07-26: 10000 [IU] via INTRAVENOUS

## 2017-07-26 MED ORDER — NEPRO/CARBSTEADY PO LIQD: 237.0000 mL | ORAL | Status: DC

## 2017-07-26 MED ORDER — DIPHENHYDRAMINE HCL 50 MG/ML IJ SOLN
25.0000 mg | Freq: Once | INTRAMUSCULAR | Status: AC
  Administered 2017-07-26: 25 mg via INTRAVENOUS

## 2017-07-26 NOTE — Progress Notes (Signed)
Post HD assessment    07/26/17 1606  Neurological  Level of Consciousness Alert  Orientation Level Oriented X4  Respiratory  Respiratory Pattern Regular;Unlabored  Chest Assessment Chest expansion symmetrical  Cough Non-productive  Cardiac  ECG Monitor Yes  Vascular  R Radial Pulse +2  L Radial Pulse +2  Edema Generalized  Integumentary  Integumentary (WDL) X  Skin Color Appropriate for ethnicity  Musculoskeletal  Musculoskeletal (WDL) X  Generalized Weakness Yes  Assistive Device None  GU Assessment  Genitourinary (WDL) X  Genitourinary Symptoms  (HD)  Psychosocial  Psychosocial (WDL) WDL

## 2017-07-26 NOTE — ED Provider Notes (Signed)
[facility] Emergency Department Provider Note  ____________________________________________  Time seen: Approximately 11:54 AM  I have reviewed the triage vital signs and the nursing notes.   HISTORY  Chief Complaint Vascular Access Problem   HPI Renee Beale is a 57 y.o. male with a history of ESRD on HD Monday, Wednesday, Friday who presents for his scheduled dialysis.  Patient's last dialysis was 2 days ago.  He denies shortness of breath, leg swelling, or chest pain.  Patient has no complaints at this time.  Past Medical History:  Diagnosis Date  . Alcohol abuse   . CHF (congestive heart failure) ([place])   . Cirrhosis ([place])   . Coronary artery disease 2009  . Drug abuse ([place])   . End stage renal disease on dialysis [facility]) NEPHROLOGIST-   DR [facility]  IN [place]   HEMODIALYSIS --   TUES/  THURS/  SAT  . Gastrointestinal bleed 06/13/2017   From chart...hx of multiple GI bleeds  . GERD (gastroesophageal reflux disease)   . Hyperlipidemia   . Hypertension   . PAD (peripheral artery disease) ([place])   . Renal insufficiency    Per pt, 32 oz fluid restriction per day  . S/P triple vessel bypass 06/09/2016   2009ish  . Suicidal ideation    & HOMICIDAL IDEATION --  06-16-2013   ADMITTED TO BEHAVIOR HEALTH    Patient Active Problem List   Diagnosis Date Noted  . Protein-calorie malnutrition, severe 06/14/2017  . Encounter for dialysis [facility])   . Palliative care by specialist   . Goals of care, counseling/discussion   . Malnutrition of moderate degree 06/05/2017  . Secondary esophageal varices without bleeding ([place])   . Stomach irritation   . Idiopathic esophageal varices without bleeding ([place])   . Alcoholic hepatitis with ascites 05/24/2017  . ESRD (end stage renal disease) ([place]) 04/28/2017  . Uremia 03/08/2017  . ESRD on hemodialysis ([place]) 03/03/2017  . Weakness 02/28/2017  . Hypocalcemia 02/22/2017  . Shortness of breath 11/26/2016  .  COPD (chronic obstructive pulmonary disease) ([place]) 10/30/2016  . COPD exacerbation ([place]) 10/29/2016  . Anemia   . Heme positive stool   . Ulceration of intestine   . Benign neoplasm of transverse colon   . Acute gastrointestinal hemorrhage   . Esophageal candidiasis ([place])   . Angiodysplasia of intestinal tract   . Acute respiratory failure with hypoxia ([place]) 07/03/2016  . GI bleeding 06/24/2016  . Rectal bleeding 06/14/2016  . Anemia of chronic disease 06/01/2016  . MRSA carrier 06/01/2016  . Chronic renal failure 05/23/2016  . Ischemic heart disease 05/23/2016  . Angiodysplasia of small intestine   . Melena   . Small bowel bleed not requiring more than 4 units of blood in 24 hours, ICU, or surgery   . Anemia due to chronic blood loss   . Abdominal pain 05/05/2016  . Acute posthemorrhagic anemia 04/17/2016  . Gastrointestinal bleed 04/17/2016  . History of esophagogastroduodenoscopy (EGD) 04/17/2016  . Elevated troponin 04/17/2016  . Alcohol abuse 04/17/2016  . Upper GI bleed 01/19/2016  . Blood in stool   . Angiodysplasia of stomach and duodenum with hemorrhage   . Gastritis   . Reflux esophagitis   . GI bleed 05/16/2015  . Acute GI bleeding   . Symptomatic anemia 04/30/2015  . HTN (hypertension) 04/06/2015  . GERD (gastroesophageal reflux disease) 04/06/2015  . HLD (hyperlipidemia) 04/06/2015  . Dyspnea 04/06/2015  . Cirrhosis of liver with ascites ([place]) 04/06/2015  .  Ascites 04/06/2015  . GIB (gastrointestinal bleeding) 03/23/2015  . Homicidal ideation 06/19/2013  . Suicidal intent 06/19/2013  . Homicidal ideations 06/19/2013  . Hyperkalemia 06/16/2013  . Mandible fracture ([place]) 06/05/2013  . Fracture, mandible ([place]) 06/02/2013  . Coronary atherosclerosis of native coronary artery 06/02/2013  . ESRD on dialysis ([place]) 06/02/2013  . Mandible open fracture ([place]) 06/02/2013    Past Surgical History:  Procedure Laterality Date  . A/V FISTULAGRAM Right 06/06/2017    Procedure: A/V FISTULAGRAM;  Surgeon: Katha Cabal, MD;  Location: [place] CV LAB;  Service: Cardiovascular;  Laterality: Right;  . A/V SHUNT INTERVENTION N/A 06/06/2017   Procedure: A/V SHUNT INTERVENTION;  Surgeon: Katha Cabal, MD;  Location: [place] CV LAB;  Service: Cardiovascular;  Laterality: N/A;  . AGILE CAPSULE N/A 06/19/2016   Procedure: AGILE CAPSULE;  Surgeon: [name], MD;  Location: [facility] ENDOSCOPY;  Service: Endoscopy;  Laterality: N/A;  . COLONOSCOPY WITH PROPOFOL N/A 06/18/2016   Procedure: COLONOSCOPY WITH PROPOFOL;  Surgeon: [name], MD;  Location: [facility] ENDOSCOPY;  Service: Endoscopy;  Laterality: N/A;  . COLONOSCOPY WITH PROPOFOL N/A 08/12/2016   Procedure: COLONOSCOPY WITH PROPOFOL;  Surgeon: [name], MD;  Location: [facility] ENDOSCOPY;  Service: Endoscopy;  Laterality: N/A;  . COLONOSCOPY WITH PROPOFOL N/A 05/05/2017   Procedure: COLONOSCOPY WITH PROPOFOL;  Surgeon: Manya Silvas, MD;  Location: [facility] ENDOSCOPY;  Service: Endoscopy;  Laterality: N/A;  . CORONARY ANGIOPLASTY  ?   PT UNABLE TO TELL IF  BEFORE OR AFTER  CABG  . CORONARY ARTERY BYPASS GRAFT  2008  ([place])   3 VESSEL  . DIALYSIS FISTULA CREATION  LAST SURGERY  APPOX  2008  . ENTEROSCOPY N/A 05/10/2016   Procedure: ENTEROSCOPY;  Surgeon: Jerene Bears, MD;  Location: [place];  Service: Gastroenterology;  Laterality: N/A;  . ENTEROSCOPY N/A 08/12/2016   Procedure: ENTEROSCOPY;  Surgeon: [name], MD;  Location: [facility] ENDOSCOPY;  Service: Endoscopy;  Laterality: N/A;  . ENTEROSCOPY Left 06/03/2017   Procedure: ENTEROSCOPY;  Surgeon: Virgel Manifold, MD;  Location: [facility] ENDOSCOPY;  Service: Endoscopy;  Laterality: Left;  Procedure date will ultimately depend on when patient is medically optimized before the procedure, pending hemodialysis and blood transfusions etc. Will place on schedule and change depending on clinical status.   . ENTEROSCOPY N/A 06/05/2017   Procedure:  ENTEROSCOPY;  Surgeon: Virgel Manifold, MD;  Location: [facility] ENDOSCOPY;  Service: Endoscopy;  Laterality: N/A;  . ENTEROSCOPY N/A 06/15/2017   Procedure: Push ENTEROSCOPY;  Surgeon: [name], MD;  Location: [facility] ENDOSCOPY;  Service: Endoscopy;  Laterality: N/A;  . ESOPHAGOGASTRODUODENOSCOPY N/A 05/07/2015   Procedure: ESOPHAGOGASTRODUODENOSCOPY (EGD);  Surgeon: Hulen Luster, MD;  Location: [facility] ENDOSCOPY;  Service: Endoscopy;  Laterality: N/A;  . ESOPHAGOGASTRODUODENOSCOPY (EGD) WITH PROPOFOL N/A 05/17/2015   Procedure: ESOPHAGOGASTRODUODENOSCOPY (EGD) WITH PROPOFOL;  Surgeon: [name], MD;  Location: [facility] ENDOSCOPY;  Service: Endoscopy;  Laterality: N/A;  . ESOPHAGOGASTRODUODENOSCOPY (EGD) WITH PROPOFOL N/A 01/20/2016   Procedure: ESOPHAGOGASTRODUODENOSCOPY (EGD) WITH PROPOFOL;  Surgeon: [name], MD;  Location: [facility] ENDOSCOPY;  Service: Endoscopy;  Laterality: N/A;  . ESOPHAGOGASTRODUODENOSCOPY (EGD) WITH PROPOFOL N/A 04/17/2016   Procedure: ESOPHAGOGASTRODUODENOSCOPY (EGD) WITH PROPOFOL;  Surgeon: Lin Landsman, MD;  Location: [facility] ENDOSCOPY;  Service: Gastroenterology;  Laterality: N/A;  . ESOPHAGOGASTRODUODENOSCOPY (EGD) WITH PROPOFOL  05/09/2016   Procedure: ESOPHAGOGASTRODUODENOSCOPY (EGD) WITH PROPOFOL;  Surgeon: Jerene Bears, MD;  Location: [place] ENDOSCOPY;  Service: Endoscopy;;  . ESOPHAGOGASTRODUODENOSCOPY (EGD) WITH PROPOFOL N/A  06/16/2016   Procedure: ESOPHAGOGASTRODUODENOSCOPY (EGD) WITH PROPOFOL;  Surgeon: [name], MD;  Location: [facility] ENDOSCOPY;  Service: Endoscopy;  Laterality: N/A;  . ESOPHAGOGASTRODUODENOSCOPY (EGD) WITH PROPOFOL N/A 05/05/2017   Procedure: ESOPHAGOGASTRODUODENOSCOPY (EGD) WITH PROPOFOL;  Surgeon: Manya Silvas, MD;  Location: [facility] ENDOSCOPY;  Service: Endoscopy;  Laterality: N/A;  . ESOPHAGOGASTRODUODENOSCOPY (EGD) WITH PROPOFOL N/A 06/15/2017   Procedure: ESOPHAGOGASTRODUODENOSCOPY (EGD) WITH PROPOFOL;  Surgeon: [name], MD;  Location: [facility] ENDOSCOPY;   Service: Endoscopy;  Laterality: N/A;  . GIVENS CAPSULE STUDY N/A 05/07/2016   Procedure: GIVENS CAPSULE STUDY;  Surgeon: Doran Stabler, MD;  Location: [place];  Service: Endoscopy;  Laterality: N/A;  . MANDIBULAR HARDWARE REMOVAL N/A 07/29/2013   Procedure: REMOVAL OF ARCH BARS;  Surgeon: Theodoro Kos, DO;  Location: [place];  Service: Plastics;  Laterality: N/A;  . ORIF MANDIBULAR FRACTURE N/A 06/05/2013   Procedure: REPAIR OF MANDIBULAR FRACTURE x 2 with maxillo-mandibular fixation ;  Surgeon: Theodoro Kos, DO;  Location: [place];  Service: Plastics;  Laterality: N/A;  . PERIPHERAL ARTERIAL STENT GRAFT Left     Prior to Admission medications   Medication Sig Start Date End Date Taking? Authorizing Provider  albuterol (PROVENTIL HFA;VENTOLIN HFA) 108 (90 Base) MCG/ACT inhaler Inhale 2 puffs into the lungs every 6 (six) hours as needed for wheezing or shortness of breath. 04/21/17   Lisa Roca, MD  alum & mag hydroxide-simeth (MAALOX/MYLANTA) 200-200-20 MG/5ML suspension Take 15 mLs every 4 (four) hours as needed by mouth for indigestion or heartburn. Patient not taking: Reported on 04/19/2017 01/25/17   Demetrios Loll, MD  atorvastatin (LIPITOR) 40 MG tablet Take 1 tablet (40 mg total) by mouth daily. 11/16/16   Vaughan Basta, MD  budesonide-formoterol (SYMBICORT) 160-4.5 MCG/ACT inhaler Inhale 2 puffs into the lungs daily. 11/16/16   Vaughan Basta, MD  calcium acetate (PHOSLO) 667 MG capsule Take 2,001 mg by mouth 3 (three) times daily.    [provider]  doxycycline (VIBRA-TABS) 100 MG tablet Take 1 tablet (100 mg total) by mouth 2 (two) times daily. 07/10/17   Harvest Dark, MD  ferrous sulfate 325 (65 FE) MG tablet Take 1 tablet by mouth 3 (three) times daily. 01/21/17   [provider]  furosemide (LASIX) 80 MG tablet Take 1 tablet (80 mg total) daily by mouth. 01/25/17   Demetrios Loll, MD  gabapentin (NEURONTIN) 300 MG capsule Take 1  capsule by mouth daily as directed 08/16/16   [provider]  ipratropium-albuterol (DUONEB) 0.5-2.5 (3) MG/3ML SOLN Take 3 mLs by nebulization every 6 (six) hours as needed. 11/28/16   Loletha Grayer, MD  labetalol (NORMODYNE) 100 MG tablet Take 100 mg by mouth daily. 03/24/17   [provider]  Multiple Vitamins-Minerals-FA (DIALYVITE SUPREME D) 3 MG TABS Take 1 tablet (3 mg total) by mouth daily. 11/28/16   Loletha Grayer, MD  nitroGLYCERIN (NITROSTAT) 0.4 MG SL tablet Place 1 tablet (0.4 mg total) under the tongue every 5 (five) minutes as needed. 04/12/16   Wende Bushy, MD  pantoprazole (PROTONIX) 40 MG tablet Take 1 tablet (40 mg total) by mouth daily. 11/28/16   Loletha Grayer, MD  Spacer/Aero Chamber Mouthpiece MISC 1 Units by Does not apply route every 4 (four) hours as needed (wheezing). 02/19/17   Darel Hong, MD  tiotropium (SPIRIVA HANDIHALER) 18 MCG inhalation capsule Place 1 capsule (18 mcg total) into inhaler and inhale daily. 11/16/16 11/16/17  Vaughan Basta, MD    Allergies Patient has no  known allergies.  Family History  Problem Relation Age of Onset  . Colon cancer Mother   . Cancer Father   . Cancer Sister   . Kidney disease Brother     Social History Social History   Tobacco Use  . Smoking status: Current Every Day Smoker    Packs/day: 0.15    Years: 40.00    Pack years: 6.00    Types: Cigarettes  . Smokeless tobacco: Never Used  Substance Use Topics  . Alcohol use: No    Comment: pt reports quitting after learning about cirrhosis  . Drug use: No    Frequency: 7.0 times per week    Types: Marijuana, Cocaine    Review of Systems  Constitutional: Negative for fever. Cardiovascular: Negative for chest pain. Respiratory: Negative for shortness of breath. Gastrointestinal: Negative for abdominal pain, vomiting or diarrhea. Genitourinary: Negative for dysuria. Musculoskeletal: Negative for back pain. Skin: Negative for  rash. Neurological: Negative for headaches, weakness or numbness. Psych: No SI or HI  ____________________________________________   PHYSICAL EXAM:  VITAL SIGNS: ED Triage Vitals  Enc Vitals Group     BP 07/26/17 1138 108/63     Pulse Rate 07/26/17 1138 87     Resp 07/26/17 1138 16     Temp 07/26/17 1138 98.3 F (36.8 C)     Temp Source 07/26/17 1138 Oral     SpO2 07/26/17 1138 98 %     Weight 07/26/17 1140 159 lb (72.1 kg)     Height 07/26/17 1140 6\' 3"  (1.905 m)     Head Circumference --      Peak Flow --      Pain Score 07/26/17 1140 0     Pain Loc --      Pain Edu? --      Excl. in [place]? --     Constitutional: Alert and oriented. Well appearing and in no apparent distress. HEENT:      Head: Normocephalic and atraumatic.         Eyes: Conjunctivae are normal. Sclera is non-icteric.       Mouth/Throat: Mucous membranes are moist.       Neck: Supple with no signs of meningismus. Cardiovascular: Regular rate and rhythm. No murmurs, gallops, or rubs. 2+ symmetrical distal pulses are present in all extremities. No JVD. Respiratory: Normal respiratory effort. Lungs are clear to auscultation bilaterally. No wheezes, crackles, or rhonchi.  Gastrointestinal: Soft, non tender, and non distended with positive bowel sounds. No rebound or guarding. Musculoskeletal: Nontender with normal range of motion in all extremities. No edema, cyanosis, or erythema of extremities. Neurologic: Normal speech and language. Face is symmetric. Moving all extremities. No gross focal neurologic deficits are appreciated. Skin: Skin is warm, dry and intact. No rash noted. Psychiatric: Mood and affect are normal. Speech and behavior are normal.  ____________________________________________   LABS (all labs ordered are listed, but only abnormal results are displayed)  Labs Reviewed - No data to display ____________________________________________  EKG  none   ____________________________________________  RADIOLOGY  none  ____________________________________________   PROCEDURES  Procedure(s) performed: None Procedures Critical Care performed:  None ____________________________________________   INITIAL IMPRESSION / ASSESSMENT AND PLAN / ED COURSE   57 y.o. male with a history of ESRD on HD Monday, Wednesday, Friday who presents for his scheduled dialysis.  Patient was taken to dialysis unit in stable condition.      As part of my medical decision making, I reviewed the following data within the  electronic MEDICAL RECORD NUMBER Nursing notes reviewed and incorporated, Old chart reviewed, Notes from prior ED visits and [place] Controlled Substance Database    Pertinent labs & imaging results that were available during my care of the patient were reviewed by me and considered in my medical decision making (see chart for details).    ____________________________________________   FINAL CLINICAL IMPRESSION(S) / ED DIAGNOSES  Final diagnoses:  Encounter for dialysis [facility])      NEW MEDICATIONS STARTED DURING THIS VISIT:  ED Discharge Orders    None       Note:  This document was prepared using Dragon voice recognition software and may include unintentional dictation errors.    Alfred Levins, [place], MD 07/26/17 [phone]

## 2017-07-26 NOTE — ED Triage Notes (Signed)
Patient here for dialysis. 

## 2017-07-26 NOTE — ED Provider Notes (Signed)
Patient back from dialysis with no acute distress.  Stable for outpatient follow-up.   Merlyn Lot, MD 07/26/17 [phone]

## 2017-07-26 NOTE — Progress Notes (Signed)
Pre HD assessment   07/26/17 1200  Vital Signs  Temp 97.8 F (36.6 C)  Temp Source Oral  Pulse Rate 86  Pulse Rate Source Monitor  Resp 15  BP 127/82  BP Location Left Arm  BP Method Automatic  Patient Position (if appropriate) Sitting  Oxygen Therapy  SpO2 94 %  O2 Device Room Air  Pain Assessment  Pain Scale 0-10  Pain Score 0  Dialysis Weight  Weight 72 kg (158 lb 11.7 oz)  Type of Weight Pre-Dialysis  Time-Out for Hemodialysis  What Procedure? HD  Pt Identifiers(min of two) First/Last Name;MRN/Account#  Correct Site? Yes  Correct Side? Yes  Correct Procedure? Yes  Consents Verified? Yes  Rad Studies Available? N/A  Safety Precautions Reviewed? Yes  Engineer, civil (consulting) Number  (5A)  Station Number 3  UF/Alarm Test Passed  Conductivity: Meter 13.8  Conductivity: Machine  14  pH 7.6  Reverse Osmosis main  Normal Saline Lot Number [id number]  Dialyzer Lot Number .[id number]  Disposable Set Lot Number [id number]  Machine Temperature 98.6 F (37 C)  Musician and Audible Yes  Blood Lines Intact and Secured Yes  Pre Treatment Patient Checks  Vascular access used during treatment Catheter  Patient is receiving dialysis in a chair Yes  Hepatitis B Surface Antigen Results Negative  Date Hepatitis B Surface Antigen Drawn 06/16/17  Hepatitis B Surface Antibody  (>10)  Date Hepatitis B Surface Antibody Drawn 03/01/17  Hemodialysis Consent Verified Yes  Hemodialysis Standing Orders Initiated Yes  ECG (Telemetry) Monitor On Yes  Prime Ordered Normal Saline  Length of  DialysisTreatment -hour(s) 3.5 Hour(s)  Dialyzer Elisio 17H NR  Dialysate 1K  Dialysis Anticoagulant None  Dialysate Flow Ordered 600  Blood Flow Rate Ordered 400 mL/min  Ultrafiltration Goal 3.5 Liters  Dialysis Blood Pressure Support Ordered Normal Saline  Education / Care Plan  Dialysis Education Provided Yes  Documented Education in Care Plan Yes  Hemodialysis Catheter Right Internal  jugular Double-lumen  No Placement Date or Time found.   Placed prior to admission: Yes  Orientation: Right  Access Location: Internal jugular  Hemodialysis Catheter Type: Double-lumen  Site Condition No complications  Blue Lumen Status Heparin locked  Red Lumen Status Heparin locked  Purple Lumen Status N/A  Dressing Type Biopatch  Dressing Status Clean;Dry;Intact  Interventions New dressing  Drainage Description None  Dressing Change Due 08/02/17

## 2017-07-26 NOTE — Progress Notes (Signed)
Post HD assessment. Pt tolerated tx well without c/o or complications. Net UF 3535, goal met.    07/26/17 1555  Vital Signs  Temp 98.2 F (36.8 C)  Temp Source Oral  Pulse Rate 91  Pulse Rate Source Monitor  Resp 20  BP 121/75  BP Location Left Arm  BP Method Automatic  Patient Position (if appropriate) Sitting  Oxygen Therapy  SpO2 97 %  O2 Device Room Air  Dialysis Weight  Weight  (pt in chair, no scale available )  Type of Weight Post-Dialysis  Post-Hemodialysis Assessment  Rinseback Volume (mL) 250 mL  KECN 80.7 V  Dialyzer Clearance Lightly streaked  Duration of HD Treatment -hour(s) 3.5 hour(s)  Hemodialysis Intake (mL) 500 mL  UF Total -Machine (mL) 4035 mL  Net UF (mL) 3535 mL  Tolerated HD Treatment Yes  Education / Care Plan  Dialysis Education Provided Yes  Documented Education in Care Plan Yes  Hemodialysis Catheter Right Internal jugular Double-lumen  No Placement Date or Time found.   Placed prior to admission: Yes  Orientation: Right  Access Location: Internal jugular  Hemodialysis Catheter Type: Double-lumen  Site Condition No complications  Blue Lumen Status Heparin locked  Red Lumen Status Heparin locked  Purple Lumen Status N/A  Catheter fill solution Heparin 1000 units/ml  Catheter fill volume (Arterial) 1.5 cc  Catheter fill volume (Venous) 1.5  Dressing Type Biopatch  Dressing Status Clean;Dry;Intact  Interventions New dressing  Drainage Description None  Dressing Change Due 08/02/17  Post treatment catheter status Capped and Clamped

## 2017-07-26 NOTE — Progress Notes (Signed)
HD tx start   07/26/17 1205  Vital Signs  Pulse Rate 84  Pulse Rate Source Monitor  Resp 19  BP 128/72  BP Location Left Arm  BP Method Automatic  Patient Position (if appropriate) Sitting  Oxygen Therapy  SpO2 95 %  O2 Device Room Air  During Hemodialysis Assessment  Blood Flow Rate (mL/min) 400 mL/min  Arterial Pressure (mmHg) -140 mmHg  Venous Pressure (mmHg) 110 mmHg  Transmembrane Pressure (mmHg) 80 mmHg  Ultrafiltration Rate (mL/min) 1140 mL/min  Dialysate Flow Rate (mL/min) 600 ml/min  Conductivity: Machine  14.2  HD Safety Checks Performed Yes  Dialysis Fluid Bolus Normal Saline  Bolus Amount (mL) 250 mL  Intra-Hemodialysis Comments Tx initiated  Hemodialysis Catheter Right Internal jugular Double-lumen  No Placement Date or Time found.   Placed prior to admission: Yes  Orientation: Right  Access Location: Internal jugular  Hemodialysis Catheter Type: Double-lumen  Blue Lumen Status Infusing  Red Lumen Status Infusing

## 2017-07-26 NOTE — Discharge Instructions (Addendum)
Follow up with PCP

## 2017-07-26 NOTE — Progress Notes (Signed)
HD tx end   07/26/17 1545  Vital Signs  Pulse Rate 91  Pulse Rate Source Monitor  Resp 16  BP 130/70  BP Location Left Arm  BP Method Automatic  Patient Position (if appropriate) Sitting  Oxygen Therapy  SpO2 96 %  O2 Device Room Air  During Hemodialysis Assessment  Dialysis Fluid Bolus Normal Saline  Bolus Amount (mL) 250 mL  Intra-Hemodialysis Comments Tx completed

## 2017-07-26 NOTE — Progress Notes (Signed)
Pre HD assessment    07/26/17 1201  Neurological  Level of Consciousness Alert  Orientation Level Oriented X4  Respiratory  Respiratory Pattern Regular;Unlabored  Chest Assessment Chest expansion symmetrical  Cough Non-productive  Cardiac  ECG Monitor Yes  Vascular  R Radial Pulse +2  L Radial Pulse +2  Edema Generalized  Integumentary  Integumentary (WDL) X  Skin Color Appropriate for ethnicity  Musculoskeletal  Musculoskeletal (WDL) X  Generalized Weakness Yes  Assistive Device None  GU Assessment  Genitourinary (WDL) X  Genitourinary Symptoms  (HD)  Psychosocial  Psychosocial (WDL) WDL

## 2017-07-26 NOTE — Progress Notes (Signed)
[facility]  ROUNDING NOTE   Subjective:    Seen and examined during hemodialysis treatment. Tolerating treatment well. 1K bath. UF goal of 3.5 liters.   Paracentesis on Monday: 5.8 liters removed. IV albumin given.     HEMODIALYSIS FLOWSHEET:  Blood Flow Rate (mL/min): 400 mL/min Arterial Pressure (mmHg): -160 mmHg Venous Pressure (mmHg): 110 mmHg Transmembrane Pressure (mmHg): 60 mmHg Ultrafiltration Rate (mL/min): 1140 mL/min Dialysate Flow Rate (mL/min): 600 ml/min Conductivity: Machine : 14.2 Conductivity: Machine : 14.2 Dialysis Fluid Bolus: Normal Saline Bolus Amount (mL): 250 mL     Objective:  Vital signs in last 24 hours:  Temp:  [97.8 F (36.6 C)-98.3 F (36.8 C)] 97.8 F (36.6 C) (05/15 1200) Pulse Rate:  [78-87] 78 (05/15 1430) Resp:  [15-23] 16 (05/15 1430) BP: (99-143)/(60-90) 121/68 (05/15 1430) SpO2:  [94 %-98 %] 97 % (05/15 1430) Weight:  [72 kg (158 lb 11.7 oz)-72.1 kg (159 lb)] 72 kg (158 lb 11.7 oz) (05/15 1200)  Weight change:  Filed Weights   07/26/17 1140 07/26/17 1200  Weight: 72.1 kg (159 lb) 72 kg (158 lb 11.7 oz)    Intake/Output: No intake/output data recorded.   Intake/Output this shift:  No intake/output data recorded.  Physical Exam: General: NAD, sitting in chair  Head: Normocephalic, atraumatic. Moist oral mucosal membranes  Eyes: Anicteric  Neck: Supple  Lungs:  normal effort, b/l mild crackles  Heart: Regular rate and rhythm  Abdomen:  Soft, nontender, distended  Extremities:  no peripheral edema.  Neurologic: Nonfocal, moving all four extremities  Skin: No lesions  Access: RUE AVF - no bruit or thrill, RIJ permcath    Basic Metabolic Panel: Recent Labs  Lab 07/21/17 1220 07/24/17 1211  NA 139 139  K 5.8* 6.3*  CL 96* 91*  CO2 35* 35*  GLUCOSE 114* 125*  BUN 56* 57*  CREATININE 4.55* 7.95*  CALCIUM 8.4* 8.7*  PHOS 3.0 6.5*    Liver Function Tests: Recent Labs  Lab 07/21/17 1220  07/24/17 1211  ALBUMIN 2.6* 2.7*   No results for input(s): LIPASE, AMYLASE in the last 168 hours. No results for input(s): AMMONIA in the last 168 hours.  CBC: Recent Labs  Lab 07/21/17 1220 07/24/17 1211  WBC 6.8 7.1  HGB 7.4* 7.6*  HCT 23.8* 24.7*  MCV 82.5 82.4  PLT 298 351    Cardiac Enzymes: No results for input(s): CKTOTAL, CKMB, CKMBINDEX, TROPONINI in the last 168 hours.  BNP: Invalid input(s): POCBNP  CBG: No results for input(s): GLUCAP in the last 168 hours.  Microbiology: Results for orders placed or performed during the hospital encounter of 07/12/17  Culture, blood (Routine X 2) w Reflex to ID Panel     Status: None   Collection Time: 07/12/17  4:43 PM  Result Value Ref Range Status   Specimen Description BLOOD LINE CENTRAL  Final   Special Requests   Final    BOTTLES DRAWN AEROBIC AND ANAEROBIC Blood Culture adequate volume   Culture   Final    NO GROWTH 5 DAYS Performed at [facility], [address] [place], [place] [address]    Report Status 07/17/2017 FINAL  Final    Coagulation Studies: No results for input(s): LABPROT, INR in the last 72 hours.  Urinalysis: No results for input(s): COLORURINE, LABSPEC, PHURINE, GLUCOSEU, HGBUR, BILIRUBINUR, KETONESUR, PROTEINUR, UROBILINOGEN, NITRITE, LEUKOCYTESUR in the last 72 hours.  Invalid input(s): APPERANCEUR    Imaging: US Paracentesis  Result Date: 07/24/2017 INDICATION: 57 year old  with recurrent ascites. EXAM: ULTRASOUND GUIDED THERAPEUTIC PARACENTESIS MEDICATIONS: None. COMPLICATIONS: None immediate. PROCEDURE: Informed written consent was obtained from the patient after a discussion of the risks, benefits and alternatives to treatment. A timeout was performed prior to the initiation of the procedure. Initial ultrasound scanning demonstrates a large amount of ascites within the right lower abdominal quadrant. The right lower abdomen was prepped and draped in the usual sterile  fashion. 1% lidocaine was used for local anesthesia. Following this, a 6 Fr Safe-T-Centesis catheter was introduced. An ultrasound image was saved for documentation purposes. The paracentesis was performed. The catheter was removed and a dressing was applied. The patient tolerated the procedure well without immediate post procedural complication. FINDINGS: A total of approximately 5.8 L of yellow fluid was removed. IMPRESSION: Successful ultrasound-guided paracentesis yielding 5.8 liters of peritoneal fluid. Electronically Signed   By: Stanley [name] M.D.   On: 07/24/2017 17:01     Medications:       Assessment/ Plan:  [name] is a 57 y.o. black male withend stage renal disease on hemodialysis secondary to Alport's syndrome, ascites, hypertension, anemia of chronic kidney disease, coronary artery disease, peripheral vascular disease, hyperlipidemia, gastrointestinal AVMs, pulmonary hypertension,   nooutpatientdialysis unit currently  1.  Hyperkalemia Hemodialysis treatment with 1 K    2.  End-stage renal disease: MWF schedule.  3.5 liters  3. Anemia with chronic kidney disease: hemoglobin 7.6 EPO with HD  4. Dialysis device complication: AVF nonfunction. Currently using RIJ permcath Needs evaluation for a new access. Patient to f/u with outpatient vascular office  5. Hypertension: elevated during treatment.  - furosemide and labetalol  6. Ascites: - status post ultrasound guided large volume paracentesis with IV albumin on 5/13   LOS: 0 Chela Sut[place] 5/15/20192:36 PM

## 2017-07-26 NOTE — ED Notes (Signed)
First Nurse Note:  Patient here for dialysis.

## 2017-07-28 ENCOUNTER — Encounter: Payer: Self-pay | Admitting: Emergency Medicine

## 2017-07-28 ENCOUNTER — Other Ambulatory Visit: Payer: Self-pay

## 2017-07-28 ENCOUNTER — Non-Acute Institutional Stay
Admission: EM | Admit: 2017-07-28 | Discharge: 2017-07-28 | Disposition: A | Discharge status: Home / Self Care | Payer: Medicare Other | Attending: Emergency Medicine | Admitting: Emergency Medicine

## 2017-07-28 DIAGNOSIS — I509 Heart failure, unspecified: Secondary | ICD-10-CM | POA: Insufficient documentation

## 2017-07-28 DIAGNOSIS — J449 Chronic obstructive pulmonary disease, unspecified: Secondary | ICD-10-CM | POA: Diagnosis not present

## 2017-07-28 DIAGNOSIS — F17210 Nicotine dependence, cigarettes, uncomplicated: Secondary | ICD-10-CM | POA: Insufficient documentation

## 2017-07-28 DIAGNOSIS — I132 Hypertensive heart and chronic kidney disease with heart failure and with stage 5 chronic kidney disease, or end stage renal disease: Secondary | ICD-10-CM | POA: Diagnosis not present

## 2017-07-28 DIAGNOSIS — Z79899 Other long term (current) drug therapy: Secondary | ICD-10-CM | POA: Insufficient documentation

## 2017-07-28 DIAGNOSIS — N186 End stage renal disease: Secondary | ICD-10-CM | POA: Diagnosis not present

## 2017-07-28 DIAGNOSIS — Z951 Presence of aortocoronary bypass graft: Secondary | ICD-10-CM | POA: Diagnosis not present

## 2017-07-28 DIAGNOSIS — E785 Hyperlipidemia, unspecified: Secondary | ICD-10-CM | POA: Insufficient documentation

## 2017-07-28 DIAGNOSIS — K7460 Unspecified cirrhosis of liver: Secondary | ICD-10-CM | POA: Insufficient documentation

## 2017-07-28 DIAGNOSIS — Z4931 Encounter for adequacy testing for hemodialysis: Secondary | ICD-10-CM | POA: Diagnosis not present

## 2017-07-28 DIAGNOSIS — I120 Hypertensive chronic kidney disease with stage 5 chronic kidney disease or end stage renal disease: Secondary | ICD-10-CM | POA: Diagnosis not present

## 2017-07-28 DIAGNOSIS — Z992 Dependence on renal dialysis: Secondary | ICD-10-CM | POA: Insufficient documentation

## 2017-07-28 DIAGNOSIS — I2510 Atherosclerotic heart disease of native coronary artery without angina pectoris: Secondary | ICD-10-CM | POA: Insufficient documentation

## 2017-07-28 DIAGNOSIS — D631 Anemia in chronic kidney disease: Secondary | ICD-10-CM | POA: Diagnosis not present

## 2017-07-28 DIAGNOSIS — E875 Hyperkalemia: Secondary | ICD-10-CM | POA: Diagnosis not present

## 2017-07-28 LAB — CBC
HCT: 25.2 % — ABNORMAL LOW (ref 40.0–52.0)
Hemoglobin: 7.5 g/dL — ABNORMAL LOW (ref 13.0–18.0)
MCH: 25.4 pg — AB (ref 26.0–34.0)
MCHC: 30 g/dL — AB (ref 32.0–36.0)
MCV: 84.5 fL (ref 80.0–100.0)
Platelets: 322 10*3/uL (ref 150–440)
RBC: 2.98 MIL/uL — AB (ref 4.40–5.90)
RDW: 19.3 % — ABNORMAL HIGH (ref 11.5–14.5)
WBC: 6.5 10*3/uL (ref 3.8–10.6)

## 2017-07-28 LAB — BASIC METABOLIC PANEL
Anion gap: 10 (ref 5–15)
BUN: 43 mg/dL — ABNORMAL HIGH (ref 6–20)
CALCIUM: 8.8 mg/dL — AB (ref 8.9–10.3)
CO2: 35 mmol/L — ABNORMAL HIGH (ref 22–32)
CREATININE: 6.07 mg/dL — AB (ref 0.61–1.24)
Chloride: 94 mmol/L — ABNORMAL LOW (ref 101–111)
GFR, EST AFRICAN AMERICAN: 11 mL/min — AB (ref >60)
GFR, EST NON AFRICAN AMERICAN: 9 mL/min — AB (ref >60)
Glucose, Bld: 84 mg/dL (ref 65–99)
Potassium: 5.5 mmol/L — ABNORMAL HIGH (ref 3.5–5.1)
Sodium: 139 mmol/L (ref 135–145)

## 2017-07-28 MED ORDER — ALTEPLASE 2 MG IJ SOLR: 2.0000 mg | Freq: Once | INTRAMUSCULAR | Status: DC | PRN

## 2017-07-28 MED ORDER — SODIUM CHLORIDE 0.9 % IV SOLN: 100.0000 mL | INTRAVENOUS | Status: DC | PRN

## 2017-07-28 MED ORDER — PENTAFLUOROPROP-TETRAFLUOROETH EX AERO
1.0000 "application " | INHALATION_SPRAY | CUTANEOUS | Status: DC | PRN
  Filled 2017-07-28: qty 30

## 2017-07-28 MED ORDER — EPOETIN ALFA 10000 UNIT/ML IJ SOLN
10000.0000 [IU] | Freq: Once | INTRAMUSCULAR | Status: AC
  Administered 2017-07-28: 10000 [IU] via INTRAVENOUS

## 2017-07-28 MED ORDER — HEPARIN SODIUM (PORCINE) 1000 UNIT/ML DIALYSIS: 1000.0000 [IU] | INTRAMUSCULAR | Status: DC | PRN

## 2017-07-28 MED ORDER — LIDOCAINE-PRILOCAINE 2.5-2.5 % EX CREA
1.0000 "application " | TOPICAL_CREAM | CUTANEOUS | Status: DC | PRN
  Filled 2017-07-28: qty 5

## 2017-07-28 MED ORDER — LIDOCAINE HCL (PF) 1 % IJ SOLN
5.0000 mL | INTRAMUSCULAR | Status: DC | PRN
  Filled 2017-07-28: qty 5

## 2017-07-28 NOTE — Progress Notes (Signed)
[facility]  ROUNDING NOTE   Subjective:    Seen and examined during hemodialysis treatment. Tolerating treatment well. 2K bath. UF goal of 3.5 liters.    HEMODIALYSIS FLOWSHEET:  Blood Flow Rate (mL/min): 350 mL/min Arterial Pressure (mmHg): -140 mmHg Venous Pressure (mmHg): 110 mmHg Transmembrane Pressure (mmHg): 80 mmHg Ultrafiltration Rate (mL/min): 890 mL/min Dialysate Flow Rate (mL/min): 800 ml/min Conductivity: Machine : 14.2 Conductivity: Machine : 14.2 Dialysis Fluid Bolus: Normal Saline Bolus Amount (mL): 250 mL Dialysate Change: 2K     Objective:  Vital signs in last 24 hours:  Temp:  [98.1 F (36.7 C)] 98.1 F (36.7 C) (05/17 1126) Pulse Rate:  [88-95] 95 (05/17 1621) Resp:  [12-19] 16 (05/17 1621) BP: (125-160)/(74-94) 125/74 (05/17 1621) SpO2:  [97 %-100 %] 98 % (05/17 1621) Weight:  [71.6 kg (157 lb 13.6 oz)-71.7 kg (158 lb)] 71.6 kg (157 lb 13.6 oz) (05/17 1135)  Weight change:  Filed Weights   07/28/17 1121 07/28/17 1135  Weight: 71.7 kg (158 lb) 71.6 kg (157 lb 13.6 oz)    Intake/Output: No intake/output data recorded.   Intake/Output this shift:  No intake/output data recorded.  Physical Exam: General: NAD, sitting in chair  Head: Normocephalic, atraumatic. Moist oral mucosal membranes  Eyes: Anicteric  Neck: Supple  Lungs:  normal effort, b/l mild crackles  Heart: Regular rate and rhythm  Abdomen:  Soft, nontender, distended  Extremities:  no peripheral edema.  Neurologic: Nonfocal, moving all four extremities  Skin: No lesions  Access: RUE AVF - no bruit or thrill, RIJ permcath    Basic Metabolic Panel: Recent Labs  Lab 07/24/17 1211 07/28/17 1150  NA 139 139  K 6.3* 5.5*  CL 91* 94*  CO2 35* 35*  GLUCOSE 125* 84  BUN 57* 43*  CREATININE 7.95* 6.07*  CALCIUM 8.7* 8.8*  PHOS 6.5*  --     Liver Function Tests: Recent Labs  Lab 07/24/17 1211  ALBUMIN 2.7*   No results for input(s): LIPASE, AMYLASE in  the last 168 hours. No results for input(s): AMMONIA in the last 168 hours.  CBC: Recent Labs  Lab 07/24/17 1211 07/28/17 1150  WBC 7.1 6.5  HGB 7.6* 7.5*  HCT 24.7* 25.2*  MCV 82.4 84.5  PLT 351 322    Cardiac Enzymes: No results for input(s): CKTOTAL, CKMB, CKMBINDEX, TROPONINI in the last 168 hours.  BNP: Invalid input(s): POCBNP  CBG: No results for input(s): GLUCAP in the last 168 hours.  Microbiology: Results for orders placed or performed during the hospital encounter of 07/12/17  Culture, blood (Routine X 2) w Reflex to ID Panel     Status: None   Collection Time: 07/12/17  4:43 PM  Result Value Ref Range Status   Specimen Description BLOOD LINE CENTRAL  Final   Special Requests   Final    BOTTLES DRAWN AEROBIC AND ANAEROBIC Blood Culture adequate volume   Culture   Final    NO GROWTH 5 DAYS Performed at [facility], [address] [place], [place] [address]    Report Status 07/17/2017 FINAL  Final    Coagulation Studies: No results for input(s): LABPROT, INR in the last 72 hours.  Urinalysis: No results for input(s): COLORURINE, LABSPEC, PHURINE, GLUCOSEU, HGBUR, BILIRUBINUR, KETONESUR, PROTEINUR, UROBILINOGEN, NITRITE, LEUKOCYTESUR in the last 72 hours.  Invalid input(s): APPERANCEUR    Imaging: No results found.   Medications:       Assessment/ Plan:  Stanley [name] is a  57 y.o. black male withend stage renal disease on hemodialysis secondary to Alport's syndrome, ascites, hypertension, anemia of chronic kidney disease, coronary artery disease, peripheral vascular disease, hyperlipidemia, gastrointestinal AVMs, pulmonary hypertension,   nooutpatientdialysis unit currently  1.  Hyperkalemia Hemodialysis treatment with 2 K    2.  End-stage renal disease: MWF schedule. Seen and examined on treatment.   3. Anemia with chronic kidney disease: hemoglobin 7.5 EPO with HD  4. Dialysis device complication: AVF  nonfunction. Currently using RIJ permcath Needs evaluation for a new access. Patient to f/u with outpatient vascular office  5. Hypertension: at goal during treatment.  - furosemide and labetalol  6. Ascites: - status post ultrasound guided large volume paracentesis with IV albumin on 5/13   LOS: 0 Gabreal [place] 5/17/20194:23 PM

## 2017-07-28 NOTE — ED Triage Notes (Signed)
Here for dialysis routine

## 2017-07-28 NOTE — ED Provider Notes (Signed)
[facility] Emergency Department Provider Note  Time seen: 4:28 PM  I have reviewed the triage vital signs and the nursing notes.   HISTORY  Chief Complaint No chief complaint on file.    HPI Stanley [name] is a 56 y.o. male with a past medical history of CHF, cirrhosis, end-stage renal disease on hemodialysis, hypertension, hyperlipidemia, presents to the emergency department for routine dialysis.  According to the patient it is his normal dialysis day so he presented for dialysis.  Patient's labs prior to dialysis show a potassium of 5.5, H&H at baseline.  Patient has no medical complaints today.  Patient has received dialysis, and is asking to be discharged home.  Past Medical History:  Diagnosis Date  . Alcohol abuse   . CHF (congestive heart failure) ([place])   . Cirrhosis ([place])   . Coronary artery disease 2009  . Drug abuse ([place])   . End stage renal disease on dialysis [facility]) NEPHROLOGIST-   DR [facility]  IN [place]   HEMODIALYSIS --   TUES/  THURS/  SAT  . Gastrointestinal bleed 06/13/2017   From chart...hx of multiple GI bleeds  . GERD (gastroesophageal reflux disease)   . Hyperlipidemia   . Hypertension   . PAD (peripheral artery disease) ([place])   . Renal insufficiency    Per pt, 32 oz fluid restriction per day  . S/P triple vessel bypass 06/09/2016   2009ish  . Suicidal ideation    & HOMICIDAL IDEATION --  06-16-2013   ADMITTED TO BEHAVIOR HEALTH    Patient Active Problem List   Diagnosis Date Noted  . ESRD (end stage renal disease) on dialysis ([place]) 07/28/2017  . Protein-calorie malnutrition, severe 06/14/2017  . Encounter for dialysis [facility])   . Palliative care by specialist   . Goals of care, counseling/discussion   . Malnutrition of moderate degree 06/05/2017  . Secondary esophageal varices without bleeding ([place])   . Stomach irritation   . Idiopathic esophageal varices without bleeding ([place])   . Alcoholic hepatitis with ascites  05/24/2017  . ESRD (end stage renal disease) ([place]) 04/28/2017  . Uremia 03/08/2017  . ESRD on hemodialysis ([place]) 03/03/2017  . Weakness 02/28/2017  . Hypocalcemia 02/22/2017  . Shortness of breath 11/26/2016  . COPD (chronic obstructive pulmonary disease) ([place]) 10/30/2016  . COPD exacerbation ([place]) 10/29/2016  . Anemia   . Heme positive stool   . Ulceration of intestine   . Benign neoplasm of transverse colon   . Acute gastrointestinal hemorrhage   . Esophageal candidiasis ([place])   . Angiodysplasia of intestinal tract   . Acute respiratory failure with hypoxia ([place]) 07/03/2016  . GI bleeding 06/24/2016  . Rectal bleeding 06/14/2016  . Anemia of chronic disease 06/01/2016  . MRSA carrier 06/01/2016  . Chronic renal failure 05/23/2016  . Ischemic heart disease 05/23/2016  . Angiodysplasia of small intestine   . Melena   . Small bowel bleed not requiring more than 4 units of blood in 24 hours, ICU, or surgery   . Anemia due to chronic blood loss   . Abdominal pain 05/05/2016  . Acute posthemorrhagic anemia 04/17/2016  . Gastrointestinal bleed 04/17/2016  . History of esophagogastroduodenoscopy (EGD) 04/17/2016  . Elevated troponin 04/17/2016  . Alcohol abuse 04/17/2016  . Upper GI bleed 01/19/2016  . Blood in stool   . Angiodysplasia of stomach and duodenum with hemorrhage   . Gastritis   . Reflux esophagitis   . GI bleed 05/16/2015  . Acute  GI bleeding   . Symptomatic anemia 04/30/2015  . HTN (hypertension) 04/06/2015  . GERD (gastroesophageal reflux disease) 04/06/2015  . HLD (hyperlipidemia) 04/06/2015  . Dyspnea 04/06/2015  . Cirrhosis of liver with ascites ([place]) 04/06/2015  . Ascites 04/06/2015  . GIB (gastrointestinal bleeding) 03/23/2015  . Homicidal ideation 06/19/2013  . Suicidal intent 06/19/2013  . Homicidal ideations 06/19/2013  . Hyperkalemia 06/16/2013  . Mandible fracture ([place]) 06/05/2013  . Fracture, mandible ([place]) 06/02/2013  . Coronary  atherosclerosis of native coronary artery 06/02/2013  . ESRD on dialysis ([place]) 06/02/2013  . Mandible open fracture ([place]) 06/02/2013    Past Surgical History:  Procedure Laterality Date  . A/V FISTULAGRAM Right 06/06/2017   Procedure: A/V FISTULAGRAM;  Surgeon: Katha Cabal, MD;  Location: [place] CV [facility];  Service: Cardiovascular;  Laterality: Right;  . A/V SHUNT INTERVENTION N/A 06/06/2017   Procedure: A/V SHUNT INTERVENTION;  Surgeon: Katha Cabal, MD;  Location: [place] [facility];  Service: Cardiovascular;  Laterality: N/A;  . AGILE CAPSULE N/A 06/19/2016   Procedure: AGILE CAPSULE;  Surgeon: [name], MD;  Location: [facility] ENDOSCOPY;  Service: Endoscopy;  Laterality: N/A;  . COLONOSCOPY WITH PROPOFOL N/A 06/18/2016   Procedure: COLONOSCOPY WITH PROPOFOL;  Surgeon: [name], MD;  Location: [facility] ENDOSCOPY;  Service: Endoscopy;  Laterality: N/A;  . COLONOSCOPY WITH PROPOFOL N/A 08/12/2016   Procedure: COLONOSCOPY WITH PROPOFOL;  Surgeon: [name], MD;  Location: [facility] ENDOSCOPY;  Service: Endoscopy;  Laterality: N/A;  . COLONOSCOPY WITH PROPOFOL N/A 05/05/2017   Procedure: COLONOSCOPY WITH PROPOFOL;  Surgeon: Manya Silvas, MD;  Location: [facility] ENDOSCOPY;  Service: Endoscopy;  Laterality: N/A;  . CORONARY ANGIOPLASTY  ?   PT UNABLE TO TELL IF  BEFORE OR AFTER  CABG  . CORONARY ARTERY BYPASS GRAFT  2008  ([place])   3 VESSEL  . DIALYSIS FISTULA CREATION  LAST SURGERY  APPOX  2008  . ENTEROSCOPY N/A 05/10/2016   Procedure: ENTEROSCOPY;  Surgeon: Jerene Bears, MD;  Location: [place];  Service: Gastroenterology;  Laterality: N/A;  . ENTEROSCOPY N/A 08/12/2016   Procedure: ENTEROSCOPY;  Surgeon: [name], MD;  Location: [facility] ENDOSCOPY;  Service: Endoscopy;  Laterality: N/A;  . ENTEROSCOPY Left 06/03/2017   Procedure: ENTEROSCOPY;  Surgeon: Virgel Manifold, MD;  Location: [facility] ENDOSCOPY;  Service: Endoscopy;  Laterality: Left;  Procedure date will  ultimately depend on when patient is medically optimized before the procedure, pending hemodialysis and blood transfusions etc. Will place on schedule and change depending on clinical status.   . ENTEROSCOPY N/A 06/05/2017   Procedure: ENTEROSCOPY;  Surgeon: Virgel Manifold, MD;  Location: [facility] ENDOSCOPY;  Service: Endoscopy;  Laterality: N/A;  . ENTEROSCOPY N/A 06/15/2017   Procedure: Push ENTEROSCOPY;  Surgeon: [name], MD;  Location: [facility] ENDOSCOPY;  Service: Endoscopy;  Laterality: N/A;  . ESOPHAGOGASTRODUODENOSCOPY N/A 05/07/2015   Procedure: ESOPHAGOGASTRODUODENOSCOPY (EGD);  Surgeon: Hulen Luster, MD;  Location: [facility] ENDOSCOPY;  Service: Endoscopy;  Laterality: N/A;  . ESOPHAGOGASTRODUODENOSCOPY (EGD) WITH PROPOFOL N/A 05/17/2015   Procedure: ESOPHAGOGASTRODUODENOSCOPY (EGD) WITH PROPOFOL;  Surgeon: [name], MD;  Location: [facility] ENDOSCOPY;  Service: Endoscopy;  Laterality: N/A;  . ESOPHAGOGASTRODUODENOSCOPY (EGD) WITH PROPOFOL N/A 01/20/2016   Procedure: ESOPHAGOGASTRODUODENOSCOPY (EGD) WITH PROPOFOL;  Surgeon: [name], MD;  Location: [facility] ENDOSCOPY;  Service: Endoscopy;  Laterality: N/A;  . ESOPHAGOGASTRODUODENOSCOPY (EGD) WITH PROPOFOL N/A 04/17/2016   Procedure: ESOPHAGOGASTRODUODENOSCOPY (EGD) WITH PROPOFOL;  Surgeon: Lin Landsman, MD;  Location: [facility] ENDOSCOPY;  Service: Gastroenterology;  Laterality: N/A;  . ESOPHAGOGASTRODUODENOSCOPY (EGD) WITH PROPOFOL  05/09/2016   Procedure: ESOPHAGOGASTRODUODENOSCOPY (EGD) WITH PROPOFOL;  Surgeon: Jerene Bears, MD;  Location: [place];  Service: Endoscopy;;  . ESOPHAGOGASTRODUODENOSCOPY (EGD) WITH PROPOFOL N/A 06/16/2016   Procedure: ESOPHAGOGASTRODUODENOSCOPY (EGD) WITH PROPOFOL;  Surgeon: [name], MD;  Location: [facility] ENDOSCOPY;  Service: Endoscopy;  Laterality: N/A;  . ESOPHAGOGASTRODUODENOSCOPY (EGD) WITH PROPOFOL N/A 05/05/2017   Procedure: ESOPHAGOGASTRODUODENOSCOPY (EGD) WITH PROPOFOL;  Surgeon: Manya Silvas, MD;   Location: [facility] ENDOSCOPY;  Service: Endoscopy;  Laterality: N/A;  . ESOPHAGOGASTRODUODENOSCOPY (EGD) WITH PROPOFOL N/A 06/15/2017   Procedure: ESOPHAGOGASTRODUODENOSCOPY (EGD) WITH PROPOFOL;  Surgeon: [name], MD;  Location: [facility] ENDOSCOPY;  Service: Endoscopy;  Laterality: N/A;  . GIVENS CAPSULE STUDY N/A 05/07/2016   Procedure: GIVENS CAPSULE STUDY;  Surgeon: Doran Stabler, MD;  Location: [place];  Service: Endoscopy;  Laterality: N/A;  . MANDIBULAR HARDWARE REMOVAL N/A 07/29/2013   Procedure: REMOVAL OF ARCH BARS;  Surgeon: Theodoro Kos, DO;  Location: [place];  Service: Plastics;  Laterality: N/A;  . ORIF MANDIBULAR FRACTURE N/A 06/05/2013   Procedure: REPAIR OF MANDIBULAR FRACTURE x 2 with maxillo-mandibular fixation ;  Surgeon: Theodoro Kos, DO;  Location: [place];  Service: Plastics;  Laterality: N/A;  . PERIPHERAL ARTERIAL STENT GRAFT Left     Prior to Admission medications   Medication Sig Start Date End Date Taking? Authorizing Provider  albuterol (PROVENTIL HFA;VENTOLIN HFA) 108 (90 Base) MCG/ACT inhaler Inhale 2 puffs into the lungs every 6 (six) hours as needed for wheezing or shortness of breath. 04/21/17   Lisa Roca, MD  alum & mag hydroxide-simeth (MAALOX/MYLANTA) 200-200-20 MG/5ML suspension Take 15 mLs every 4 (four) hours as needed by mouth for indigestion or heartburn. Patient not taking: Reported on 04/19/2017 01/25/17   Demetrios Loll, MD  atorvastatin (LIPITOR) 40 MG tablet Take 1 tablet (40 mg total) by mouth daily. 11/16/16   Vaughan Basta, MD  budesonide-formoterol (SYMBICORT) 160-4.5 MCG/ACT inhaler Inhale 2 puffs into the lungs daily. 11/16/16   Vaughan Basta, MD  calcium acetate (PHOSLO) 667 MG capsule Take 2,001 mg by mouth 3 (three) times daily.    [provider]  doxycycline (VIBRA-TABS) 100 MG tablet Take 1 tablet (100 mg total) by mouth 2 (two) times daily. 07/10/17   Harvest Dark, MD  ferrous sulfate 325 (65  FE) MG tablet Take 1 tablet by mouth 3 (three) times daily. 01/21/17   [provider]  furosemide (LASIX) 80 MG tablet Take 1 tablet (80 mg total) daily by mouth. 01/25/17   Demetrios Loll, MD  gabapentin (NEURONTIN) 300 MG capsule Take 1 capsule by mouth daily as directed 08/16/16   [provider]  ipratropium-albuterol (DUONEB) 0.5-2.5 (3) MG/3ML SOLN Take 3 mLs by nebulization every 6 (six) hours as needed. 11/28/16   Loletha Grayer, MD  labetalol (NORMODYNE) 100 MG tablet Take 100 mg by mouth daily. 03/24/17   [provider]  Multiple Vitamins-Minerals-FA (DIALYVITE SUPREME D) 3 MG TABS Take 1 tablet (3 mg total) by mouth daily. 11/28/16   Loletha Grayer, MD  nitroGLYCERIN (NITROSTAT) 0.4 MG SL tablet Place 1 tablet (0.4 mg total) under the tongue every 5 (five) minutes as needed. 04/12/16   Wende Bushy, MD  pantoprazole (PROTONIX) 40 MG tablet Take 1 tablet (40 mg total) by mouth daily. 11/28/16   Loletha Grayer, MD  Spacer/Aero Chamber Mouthpiece MISC 1 Units by Does not apply route every 4 (four) hours as  needed (wheezing). 02/19/17   Darel Hong, MD  tiotropium (SPIRIVA HANDIHALER) 18 MCG inhalation capsule Place 1 capsule (18 mcg total) into inhaler and inhale daily. 11/16/16 11/16/17  Vaughan Basta, MD    No Known Allergies  Family History  Problem Relation Age of Onset  . Colon cancer Mother   . Cancer Father   . Cancer Sister   . Kidney disease Brother     Social History Social History   Tobacco Use  . Smoking status: Current Every Day Smoker    Packs/day: 0.15    Years: 40.00    Pack years: 6.00    Types: Cigarettes  . Smokeless tobacco: Never Used  Substance Use Topics  . Alcohol use: No    Comment: pt reports quitting after learning about cirrhosis  . Drug use: No    Frequency: 7.0 times per week    Types: Marijuana, Cocaine    Review of Systems Constitutional: Negative for fever. Eyes: Negative for visual complaints ENT:  Negative for recent illness/congestion Cardiovascular: Negative for chest pain. Respiratory: Negative for shortness of breath. Gastrointestinal: Negative for abdominal pain Genitourinary: Negative for urinary compaints Musculoskeletal: Negative for musculoskeletal complaints Skin: Negative for skin complaints  Neurological: Negative for headache All other ROS negative  ____________________________________________   PHYSICAL EXAM:  VITAL SIGNS: ED Triage Vitals  Enc Vitals Group     BP 07/28/17 1126 132/85     Pulse Rate 07/28/17 1126 91     Resp 07/28/17 1126 18     Temp 07/28/17 1126 98.1 F (36.7 C)     Temp Source 07/28/17 1126 Oral     SpO2 07/28/17 1126 99 %     Weight 07/28/17 1121 158 lb (71.7 kg)     Height 07/28/17 1121 6\' 3"  (1.905 m)     Head Circumference --      Peak Flow --      Pain Score 07/28/17 1135 0     Pain Loc --      Pain Edu? --      Excl. in [place]? --    Constitutional: Alert and oriented. Well appearing and in no distress. Eyes: Normal exam ENT   Head: Normocephalic and atraumatic.   Mouth/Throat: Mucous membranes are moist. Cardiovascular: Normal rate, regular rhythm. No murmur Respiratory: Normal respiratory effort without tachypnea nor retractions. Breath sounds are clear Gastrointestinal: Soft and nontender.  Minimal distention consistent with ascites. Musculoskeletal: Nontender with normal range of motion in all extremities.  Neurologic:  Normal speech and language. No gross focal neurologic deficits  Skin:  Skin is warm, dry and intact.  Psychiatric: Mood and affect are normal.   ____________________________________________   INITIAL IMPRESSION / ASSESSMENT AND PLAN / ED COURSE  Pertinent labs & imaging results that were available during my care of the patient were reviewed by me and considered in my medical decision making (see chart for details).  Patient presents to the emergency department for routine dialysis.  Patient has  received his routine dialysis.  He has no medical complaints today.  Asking to be discharged home.  He has a normal physical examination, nontender abdomen.  Patient states he is scheduled for paracentesis on Monday.  Patient is asking for a meal tray to go.  ____________________________________________   FINAL CLINICAL IMPRESSION(S) / ED DIAGNOSES  Hemodialysis    Harvest Dark, MD 07/28/17 1630

## 2017-07-29 IMAGING — US US PARACENTESIS
1 series · 12 of 12 positions shown · non-contrast
Comparison: None.

CLINICAL DATA: 54-year-old with cirrhosis and recurrent ascites.
Patient presents with abdominal distension.

EXAM:
ULTRASOUND GUIDED PARACENTESIS

[Series 1: us paracentesis · 0.26mm/px · 12 of 12 slices shown]
[im 1/12]
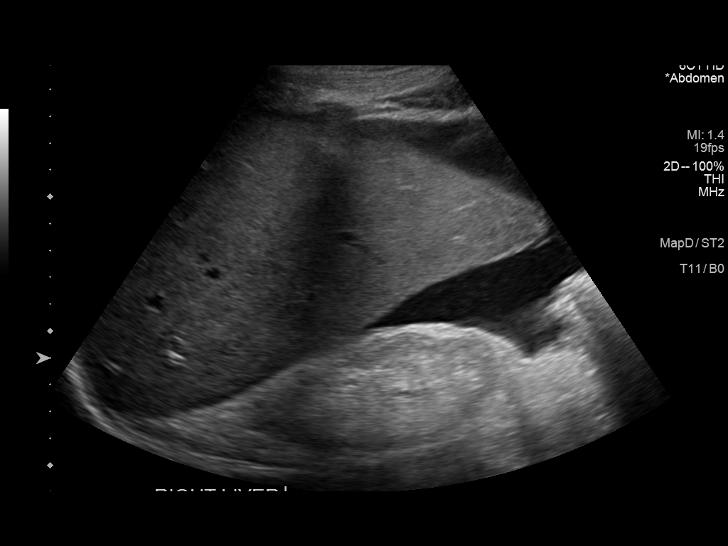
[im 2/12]
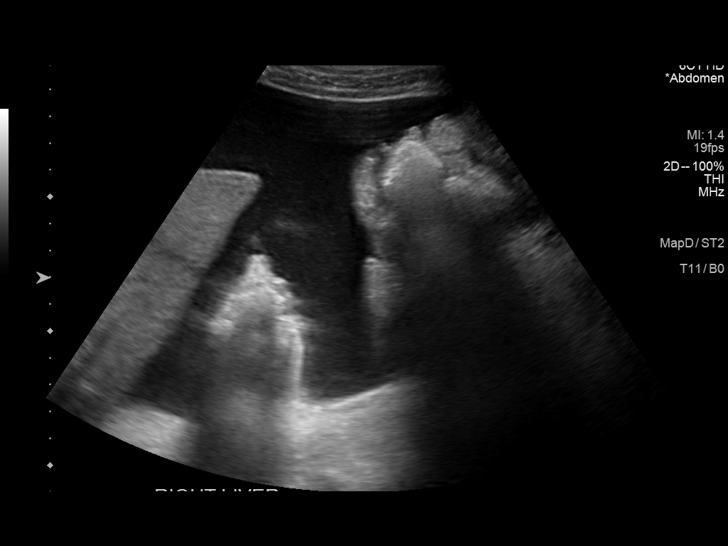
[im 3/12]
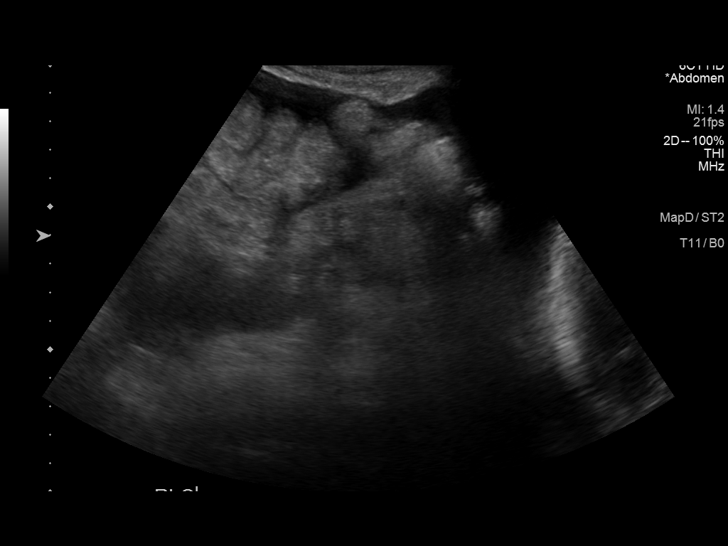
[im 4/12]
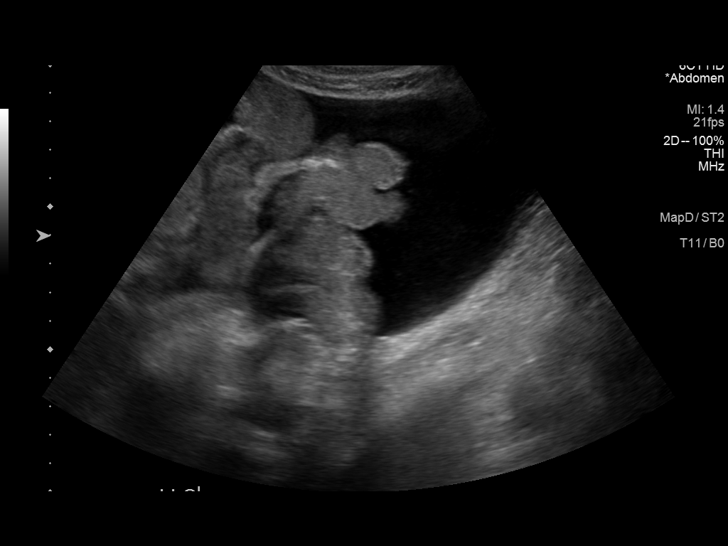
[im 5/12]
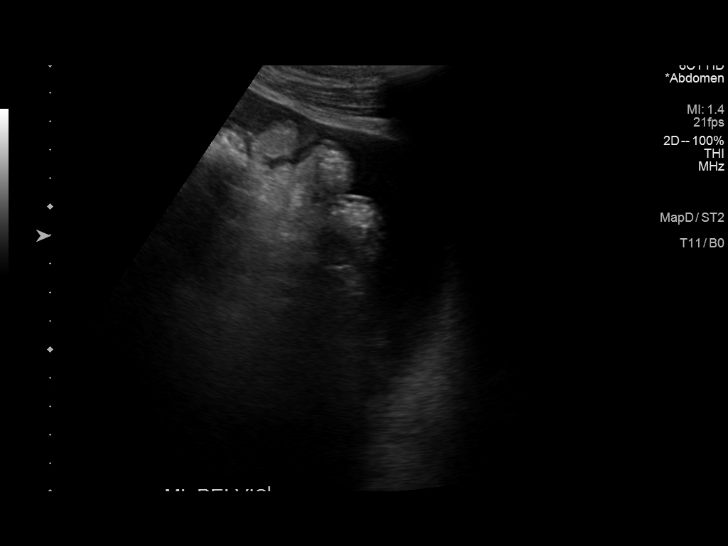
[im 6/12]
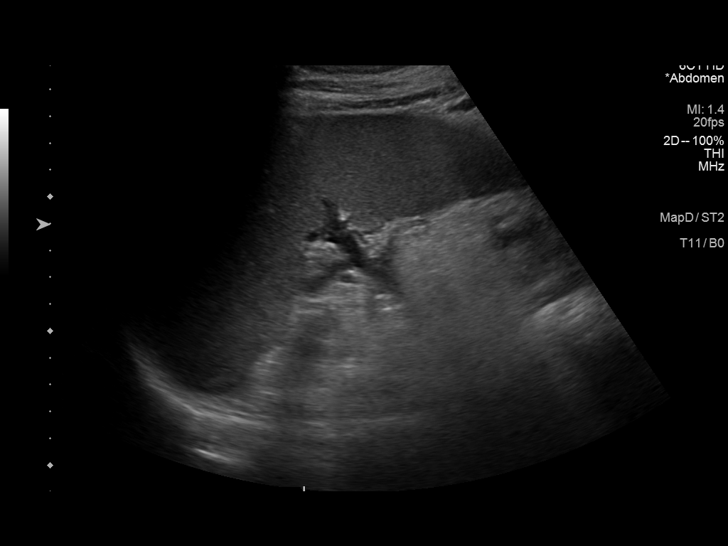
[im 7/12]
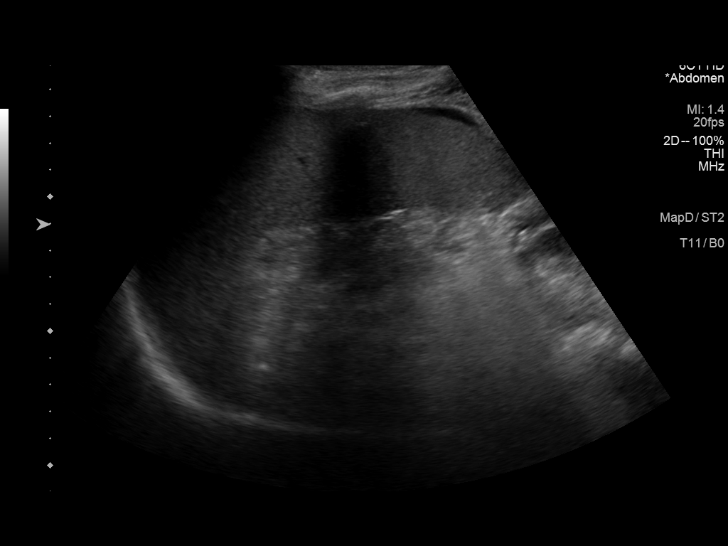
[im 8/12]
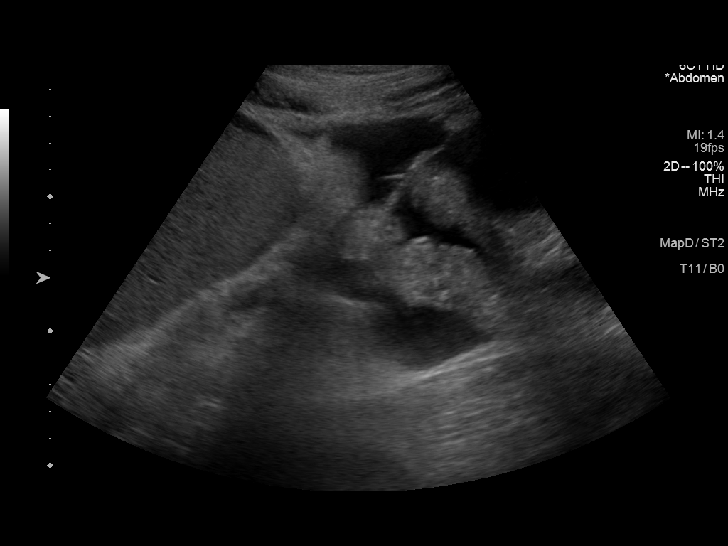
[im 9/12]
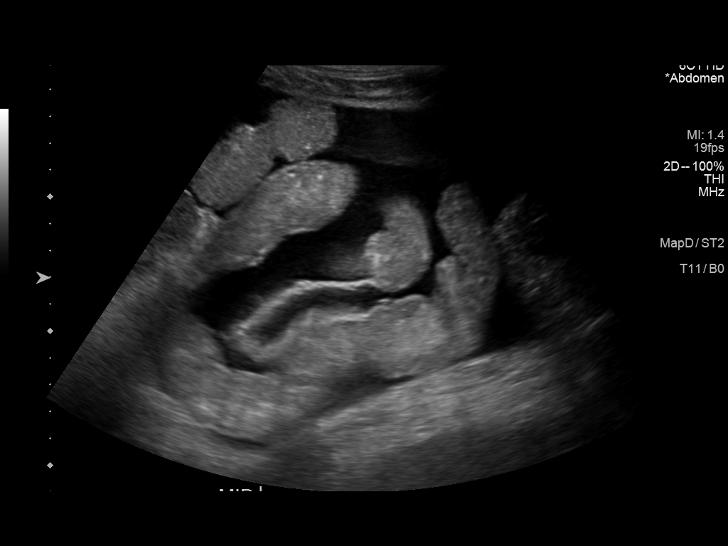
[im 10/12]
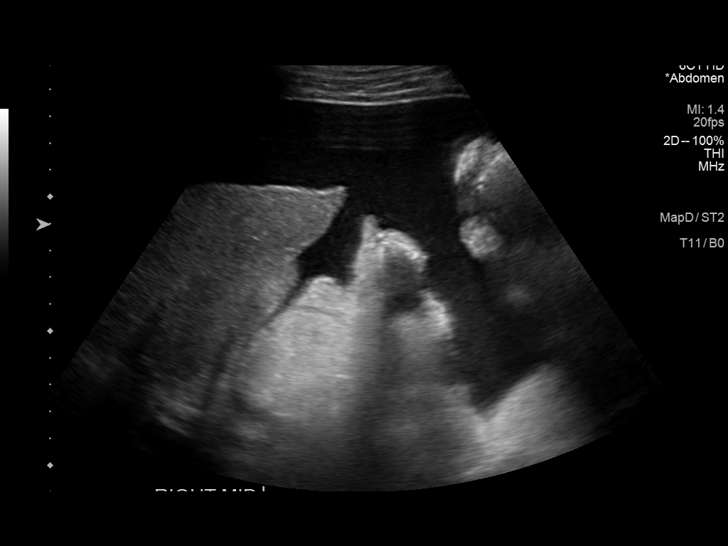
[im 11/12]
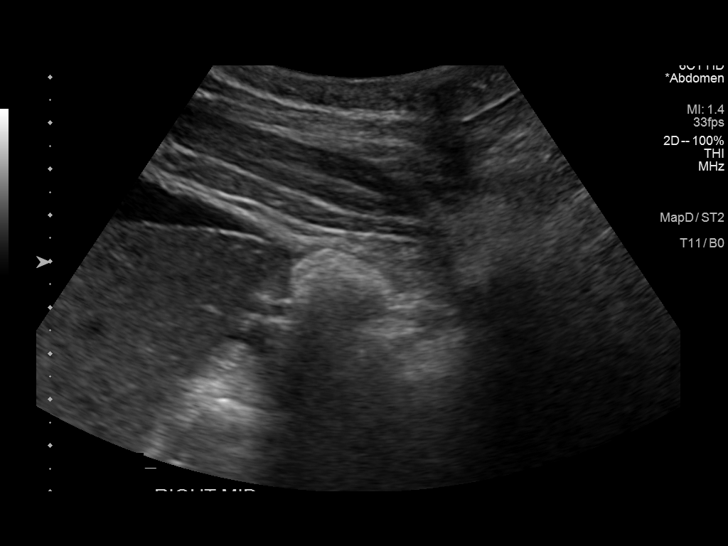
[im 12/12]
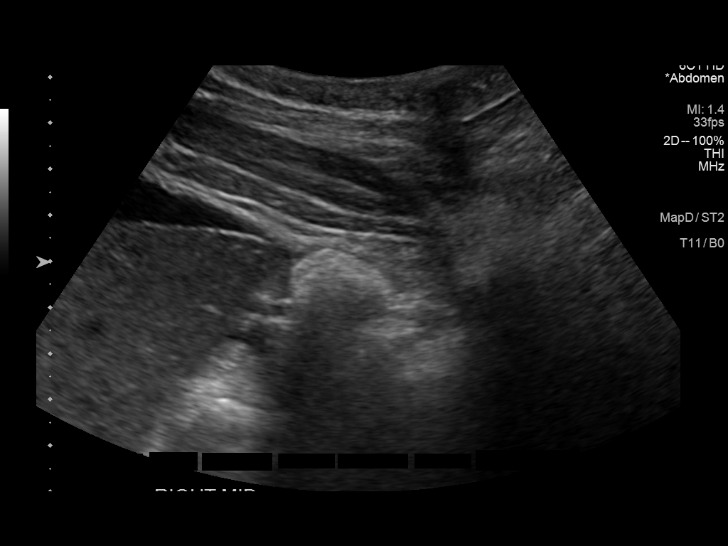

[12 of 12 positions shown; findings below may reference images not displayed]

PROCEDURE:
An ultrasound guided paracentesis was thoroughly discussed with the
patient and questions answered. The benefits, risks, alternatives
and complications were also discussed. The patient understands and
wishes to proceed with the procedure. Written consent was obtained.

Ultrasound was performed to localize and mark an adequate pocket of
fluid in the right lower quadrant of the abdomen. The area was then
prepped and draped in the normal sterile fashion. 1% Lidocaine was
used for local anesthesia. Under ultrasound guidance a
Safe-T-Centesis catheter was introduced. Paracentesis was performed.
The catheter was removed and a dressing applied.

COMPLICATIONS:
None.
FINDINGS: A total of approximately 4.75 L of dark amber colored fluid was
removed.
IMPRESSION: Successful ultrasound guided paracentesis yielding 4.75 L of
ascites.

## 2017-08-01 ENCOUNTER — Encounter: Payer: Self-pay | Admitting: Emergency Medicine

## 2017-08-01 ENCOUNTER — Other Ambulatory Visit: Payer: Self-pay

## 2017-08-01 ENCOUNTER — Non-Acute Institutional Stay
Admission: EM | Admit: 2017-08-01 | Discharge: 2017-08-01 | Disposition: A | Discharge status: Home / Self Care | Payer: Medicare Other | Attending: Emergency Medicine | Admitting: Emergency Medicine

## 2017-08-01 DIAGNOSIS — I2510 Atherosclerotic heart disease of native coronary artery without angina pectoris: Secondary | ICD-10-CM | POA: Insufficient documentation

## 2017-08-01 DIAGNOSIS — K2970 Gastritis, unspecified, without bleeding: Secondary | ICD-10-CM | POA: Diagnosis not present

## 2017-08-01 DIAGNOSIS — F17210 Nicotine dependence, cigarettes, uncomplicated: Secondary | ICD-10-CM | POA: Diagnosis not present

## 2017-08-01 DIAGNOSIS — Z841 Family history of disorders of kidney and ureter: Secondary | ICD-10-CM | POA: Diagnosis not present

## 2017-08-01 DIAGNOSIS — K210 Gastro-esophageal reflux disease with esophagitis: Secondary | ICD-10-CM | POA: Insufficient documentation

## 2017-08-01 DIAGNOSIS — K7460 Unspecified cirrhosis of liver: Secondary | ICD-10-CM | POA: Diagnosis not present

## 2017-08-01 DIAGNOSIS — Z992 Dependence on renal dialysis: Secondary | ICD-10-CM

## 2017-08-01 DIAGNOSIS — Z86010 Personal history of colonic polyps: Secondary | ICD-10-CM | POA: Diagnosis not present

## 2017-08-01 DIAGNOSIS — N186 End stage renal disease: Secondary | ICD-10-CM | POA: Insufficient documentation

## 2017-08-01 DIAGNOSIS — I509 Heart failure, unspecified: Secondary | ICD-10-CM | POA: Diagnosis not present

## 2017-08-01 DIAGNOSIS — I8510 Secondary esophageal varices without bleeding: Secondary | ICD-10-CM | POA: Diagnosis not present

## 2017-08-01 DIAGNOSIS — Z800 Family history of malignant neoplasm of digestive organs: Secondary | ICD-10-CM | POA: Diagnosis not present

## 2017-08-01 DIAGNOSIS — E875 Hyperkalemia: Secondary | ICD-10-CM | POA: Diagnosis not present

## 2017-08-01 DIAGNOSIS — R062 Wheezing: Secondary | ICD-10-CM | POA: Diagnosis not present

## 2017-08-01 DIAGNOSIS — E785 Hyperlipidemia, unspecified: Secondary | ICD-10-CM | POA: Insufficient documentation

## 2017-08-01 DIAGNOSIS — J449 Chronic obstructive pulmonary disease, unspecified: Secondary | ICD-10-CM | POA: Insufficient documentation

## 2017-08-01 DIAGNOSIS — Z951 Presence of aortocoronary bypass graft: Secondary | ICD-10-CM | POA: Insufficient documentation

## 2017-08-01 DIAGNOSIS — I132 Hypertensive heart and chronic kidney disease with heart failure and with stage 5 chronic kidney disease, or end stage renal disease: Secondary | ICD-10-CM | POA: Insufficient documentation

## 2017-08-01 DIAGNOSIS — Z7951 Long term (current) use of inhaled steroids: Secondary | ICD-10-CM | POA: Diagnosis not present

## 2017-08-01 DIAGNOSIS — D631 Anemia in chronic kidney disease: Secondary | ICD-10-CM | POA: Insufficient documentation

## 2017-08-01 DIAGNOSIS — Z79899 Other long term (current) drug therapy: Secondary | ICD-10-CM | POA: Diagnosis not present

## 2017-08-01 MED ORDER — DIPHENHYDRAMINE HCL 50 MG/ML IJ SOLN
50.0000 mg | INTRAMUSCULAR | Status: DC
  Filled 2017-08-01: qty 1

## 2017-08-01 MED ORDER — GI COCKTAIL ~~LOC~~
30.0000 mL | Freq: Once | ORAL | Status: AC
Start: 2017-08-01 — End: 2017-08-01
  Administered 2017-08-01: 30 mL via ORAL
  Filled 2017-08-01: qty 30

## 2017-08-01 NOTE — ED Provider Notes (Signed)
[facility] Emergency Department Provider Note  _____________________________________   I have reviewed the triage vital signs and the nursing notes.   HISTORY  Chief Complaint Wheezing  History limited by: Not Limited   HPI Stanley [name] is a 57 y.o. male who presents to the emergency department today for routine dialysis.  He does receive routine dialysis through the emergency department.  He states he feels like he has had some wheezing recently.  Denies any other medical complaints.    Per medical record review patient has a history of obtaining routine dialysis through the emergency department.   Past Medical History:  Diagnosis Date  . Alcohol abuse   . CHF (congestive heart failure) ([place])   . Cirrhosis ([place])   . Coronary artery disease 2009  . Drug abuse ([place])   . End stage renal disease on dialysis [facility]) NEPHROLOGIST-   DR [facility]  IN [place]   HEMODIALYSIS --   TUES/  THURS/  SAT  . Gastrointestinal bleed 06/13/2017   From chart...hx of multiple GI bleeds  . GERD (gastroesophageal reflux disease)   . Hyperlipidemia   . Hypertension   . PAD (peripheral artery disease) ([place])   . Renal insufficiency    Per pt, 32 oz fluid restriction per day  . S/P triple vessel bypass 06/09/2016   2009ish  . Suicidal ideation    & HOMICIDAL IDEATION --  06-16-2013   ADMITTED TO BEHAVIOR HEALTH    Patient Active Problem List   Diagnosis Date Noted  . ESRD (end stage renal disease) on dialysis ([place]) 07/28/2017  . Protein-calorie malnutrition, severe 06/14/2017  . Encounter for dialysis [facility])   . Palliative care by specialist   . Goals of care, counseling/discussion   . Malnutrition of moderate degree 06/05/2017  . Secondary esophageal varices without bleeding ([place])   . Stomach irritation   . Idiopathic esophageal varices without bleeding ([place])   . Alcoholic hepatitis with ascites 05/24/2017  . ESRD (end stage renal disease) ([place]) 04/28/2017   . Uremia 03/08/2017  . ESRD on hemodialysis ([place]) 03/03/2017  . Weakness 02/28/2017  . Hypocalcemia 02/22/2017  . Shortness of breath 11/26/2016  . COPD (chronic obstructive pulmonary disease) ([place]) 10/30/2016  . COPD exacerbation ([place]) 10/29/2016  . Anemia   . Heme positive stool   . Ulceration of intestine   . Benign neoplasm of transverse colon   . Acute gastrointestinal hemorrhage   . Esophageal candidiasis ([place])   . Angiodysplasia of intestinal tract   . Acute respiratory failure with hypoxia ([place]) 07/03/2016  . GI bleeding 06/24/2016  . Rectal bleeding 06/14/2016  . Anemia of chronic disease 06/01/2016  . MRSA carrier 06/01/2016  . Chronic renal failure 05/23/2016  . Ischemic heart disease 05/23/2016  . Angiodysplasia of small intestine   . Melena   . Small bowel bleed not requiring more than 4 units of blood in 24 hours, ICU, or surgery   . Anemia due to chronic blood loss   . Abdominal pain 05/05/2016  . Acute posthemorrhagic anemia 04/17/2016  . Gastrointestinal bleed 04/17/2016  . History of esophagogastroduodenoscopy (EGD) 04/17/2016  . Elevated troponin 04/17/2016  . Alcohol abuse 04/17/2016  . Upper GI bleed 01/19/2016  . Blood in stool   . Angiodysplasia of stomach and duodenum with hemorrhage   . Gastritis   . Reflux esophagitis   . GI bleed 05/16/2015  . Acute GI bleeding   . Symptomatic anemia 04/30/2015  . HTN (hypertension) 04/06/2015  .  GERD (gastroesophageal reflux disease) 04/06/2015  . HLD (hyperlipidemia) 04/06/2015  . Dyspnea 04/06/2015  . Cirrhosis of liver with ascites ([place]) 04/06/2015  . Ascites 04/06/2015  . GIB (gastrointestinal bleeding) 03/23/2015  . Homicidal ideation 06/19/2013  . Suicidal intent 06/19/2013  . Homicidal ideations 06/19/2013  . Hyperkalemia 06/16/2013  . Mandible fracture ([place]) 06/05/2013  . Fracture, mandible ([place]) 06/02/2013  . Coronary atherosclerosis of native coronary artery 06/02/2013  . ESRD on dialysis  ([place]) 06/02/2013  . Mandible open fracture ([place]) 06/02/2013    Past Surgical History:  Procedure Laterality Date  . A/V FISTULAGRAM Right 06/06/2017   Procedure: A/V FISTULAGRAM;  Surgeon: Katha Cabal, MD;  Location: [place] CV LAB;  Service: Cardiovascular;  Laterality: Right;  . A/V SHUNT INTERVENTION N/A 06/06/2017   Procedure: A/V SHUNT INTERVENTION;  Surgeon: Katha Cabal, MD;  Location: [place] CV LAB;  Service: Cardiovascular;  Laterality: N/A;  . AGILE CAPSULE N/A 06/19/2016   Procedure: AGILE CAPSULE;  Surgeon: [name], MD;  Location: [facility] ENDOSCOPY;  Service: Endoscopy;  Laterality: N/A;  . COLONOSCOPY WITH PROPOFOL N/A 06/18/2016   Procedure: COLONOSCOPY WITH PROPOFOL;  Surgeon: [name], MD;  Location: [facility] ENDOSCOPY;  Service: Endoscopy;  Laterality: N/A;  . COLONOSCOPY WITH PROPOFOL N/A 08/12/2016   Procedure: COLONOSCOPY WITH PROPOFOL;  Surgeon: [name], MD;  Location: [facility] ENDOSCOPY;  Service: Endoscopy;  Laterality: N/A;  . COLONOSCOPY WITH PROPOFOL N/A 05/05/2017   Procedure: COLONOSCOPY WITH PROPOFOL;  Surgeon: Manya Silvas, MD;  Location: [facility] ENDOSCOPY;  Service: Endoscopy;  Laterality: N/A;  . CORONARY ANGIOPLASTY  ?   PT UNABLE TO TELL IF  BEFORE OR AFTER  CABG  . CORONARY ARTERY BYPASS GRAFT  2008  ([place] , [place])   3 VESSEL  . DIALYSIS FISTULA CREATION  LAST SURGERY  APPOX  2008  . ENTEROSCOPY N/A 05/10/2016   Procedure: ENTEROSCOPY;  Surgeon: Jerene Bears, MD;  Location: [place];  Service: Gastroenterology;  Laterality: N/A;  . ENTEROSCOPY N/A 08/12/2016   Procedure: ENTEROSCOPY;  Surgeon: [name], MD;  Location: [facility] ENDOSCOPY;  Service: Endoscopy;  Laterality: N/A;  . ENTEROSCOPY Left 06/03/2017   Procedure: ENTEROSCOPY;  Surgeon: Virgel Manifold, MD;  Location: [facility] ENDOSCOPY;  Service: Endoscopy;  Laterality: Left;  Procedure date will ultimately depend on when patient is medically optimized before the procedure,  pending hemodialysis and blood transfusions etc. Will place on schedule and change depending on clinical status.   . ENTEROSCOPY N/A 06/05/2017   Procedure: ENTEROSCOPY;  Surgeon: Virgel Manifold, MD;  Location: [facility] ENDOSCOPY;  Service: Endoscopy;  Laterality: N/A;  . ENTEROSCOPY N/A 06/15/2017   Procedure: Push ENTEROSCOPY;  Surgeon: [name], MD;  Location: [facility] ENDOSCOPY;  Service: Endoscopy;  Laterality: N/A;  . ESOPHAGOGASTRODUODENOSCOPY N/A 05/07/2015   Procedure: ESOPHAGOGASTRODUODENOSCOPY (EGD);  Surgeon: Hulen Luster, MD;  Location: [facility] ENDOSCOPY;  Service: Endoscopy;  Laterality: N/A;  . ESOPHAGOGASTRODUODENOSCOPY (EGD) WITH PROPOFOL N/A 05/17/2015   Procedure: ESOPHAGOGASTRODUODENOSCOPY (EGD) WITH PROPOFOL;  Surgeon: [name], MD;  Location: [facility] ENDOSCOPY;  Service: Endoscopy;  Laterality: N/A;  . ESOPHAGOGASTRODUODENOSCOPY (EGD) WITH PROPOFOL N/A 01/20/2016   Procedure: ESOPHAGOGASTRODUODENOSCOPY (EGD) WITH PROPOFOL;  Surgeon: [name], MD;  Location: [facility] ENDOSCOPY;  Service: Endoscopy;  Laterality: N/A;  . ESOPHAGOGASTRODUODENOSCOPY (EGD) WITH PROPOFOL N/A 04/17/2016   Procedure: ESOPHAGOGASTRODUODENOSCOPY (EGD) WITH PROPOFOL;  Surgeon: Lin Landsman, MD;  Location: [facility] ENDOSCOPY;  Service: Gastroenterology;  Laterality: N/A;  . ESOPHAGOGASTRODUODENOSCOPY (EGD) WITH PROPOFOL  05/09/2016  Procedure: ESOPHAGOGASTRODUODENOSCOPY (EGD) WITH PROPOFOL;  Surgeon: Jerene Bears, MD;  Location: [place];  Service: Endoscopy;;  . ESOPHAGOGASTRODUODENOSCOPY (EGD) WITH PROPOFOL N/A 06/16/2016   Procedure: ESOPHAGOGASTRODUODENOSCOPY (EGD) WITH PROPOFOL;  Surgeon: [name], MD;  Location: [facility] ENDOSCOPY;  Service: Endoscopy;  Laterality: N/A;  . ESOPHAGOGASTRODUODENOSCOPY (EGD) WITH PROPOFOL N/A 05/05/2017   Procedure: ESOPHAGOGASTRODUODENOSCOPY (EGD) WITH PROPOFOL;  Surgeon: Manya Silvas, MD;  Location: [facility] ENDOSCOPY;  Service: Endoscopy;  Laterality: N/A;  .  ESOPHAGOGASTRODUODENOSCOPY (EGD) WITH PROPOFOL N/A 06/15/2017   Procedure: ESOPHAGOGASTRODUODENOSCOPY (EGD) WITH PROPOFOL;  Surgeon: [name], MD;  Location: [facility] ENDOSCOPY;  Service: Endoscopy;  Laterality: N/A;  . GIVENS CAPSULE STUDY N/A 05/07/2016   Procedure: GIVENS CAPSULE STUDY;  Surgeon: Doran Stabler, MD;  Location: [place];  Service: Endoscopy;  Laterality: N/A;  . MANDIBULAR HARDWARE REMOVAL N/A 07/29/2013   Procedure: REMOVAL OF ARCH BARS;  Surgeon: Theodoro Kos, DO;  Location: [place];  Service: Plastics;  Laterality: N/A;  . ORIF MANDIBULAR FRACTURE N/A 06/05/2013   Procedure: REPAIR OF MANDIBULAR FRACTURE x 2 with maxillo-mandibular fixation ;  Surgeon: Theodoro Kos, DO;  Location: [place];  Service: Plastics;  Laterality: N/A;  . PERIPHERAL ARTERIAL STENT GRAFT Left     Prior to Admission medications   Medication Sig Start Date End Date Taking? Authorizing Provider  albuterol (PROVENTIL HFA;VENTOLIN HFA) 108 (90 Base) MCG/ACT inhaler Inhale 2 puffs into the lungs every 6 (six) hours as needed for wheezing or shortness of breath. 04/21/17   Lisa Roca, MD  alum & mag hydroxide-simeth (MAALOX/MYLANTA) 200-200-20 MG/5ML suspension Take 15 mLs every 4 (four) hours as needed by mouth for indigestion or heartburn. Patient not taking: Reported on 04/19/2017 01/25/17   Demetrios Loll, MD  atorvastatin (LIPITOR) 40 MG tablet Take 1 tablet (40 mg total) by mouth daily. 11/16/16   Vaughan Basta, MD  budesonide-formoterol (SYMBICORT) 160-4.5 MCG/ACT inhaler Inhale 2 puffs into the lungs daily. 11/16/16   Vaughan Basta, MD  calcium acetate (PHOSLO) 667 MG capsule Take 2,001 mg by mouth 3 (three) times daily.    [provider]  doxycycline (VIBRA-TABS) 100 MG tablet Take 1 tablet (100 mg total) by mouth 2 (two) times daily. 07/10/17   Harvest Dark, MD  ferrous sulfate 325 (65 FE) MG tablet Take 1 tablet by mouth 3 (three) times daily. 01/21/17    [provider]  furosemide (LASIX) 80 MG tablet Take 1 tablet (80 mg total) daily by mouth. 01/25/17   Demetrios Loll, MD  gabapentin (NEURONTIN) 300 MG capsule Take 1 capsule by mouth daily as directed 08/16/16   [provider]  ipratropium-albuterol (DUONEB) 0.5-2.5 (3) MG/3ML SOLN Take 3 mLs by nebulization every 6 (six) hours as needed. 11/28/16   Loletha Grayer, MD  labetalol (NORMODYNE) 100 MG tablet Take 100 mg by mouth daily. 03/24/17   [provider]  Multiple Vitamins-Minerals-FA (DIALYVITE SUPREME D) 3 MG TABS Take 1 tablet (3 mg total) by mouth daily. 11/28/16   Loletha Grayer, MD  nitroGLYCERIN (NITROSTAT) 0.4 MG SL tablet Place 1 tablet (0.4 mg total) under the tongue every 5 (five) minutes as needed. 04/12/16   Wende Bushy, MD  pantoprazole (PROTONIX) 40 MG tablet Take 1 tablet (40 mg total) by mouth daily. 11/28/16   Loletha Grayer, MD  Spacer/Aero Chamber Mouthpiece MISC 1 Units by Does not apply route every 4 (four) hours as needed (wheezing). 02/19/17   Darel Hong, MD  tiotropium (SPIRIVA HANDIHALER) 18 MCG inhalation  capsule Place 1 capsule (18 mcg total) into inhaler and inhale daily. 11/16/16 11/16/17  Vaughan Basta, MD    Allergies Patient has no known allergies.  Family History  Problem Relation Age of Onset  . Colon cancer Mother   . Cancer Father   . Cancer Sister   . Kidney disease Brother     Social History Social History   Tobacco Use  . Smoking status: Current Every Day Smoker    Packs/day: 0.15    Years: 40.00    Pack years: 6.00    Types: Cigarettes  . Smokeless tobacco: Never Used  Substance Use Topics  . Alcohol use: No    Comment: pt reports quitting after learning about cirrhosis  . Drug use: No    Frequency: 7.0 times per week    Types: Marijuana, Cocaine    Review of Systems Constitutional: No fever/chills Eyes: No visual changes. ENT: No sore throat. Cardiovascular: Denies chest  pain. Respiratory: Denies shortness of breath. Positive for wheezing. Gastrointestinal: No abdominal pain.  No nausea, no vomiting.  No diarrhea.   Genitourinary: Negative for dysuria. Musculoskeletal: Negative for back pain. Skin: Negative for rash. Neurological: Negative for headaches, focal weakness or numbness.  ____________________________________________   PHYSICAL EXAM:  VITAL SIGNS: ED Triage Vitals  Enc Vitals Group     BP 08/01/17 1010 114/86     Pulse Rate 08/01/17 1010 90     Resp 08/01/17 1011 20     Temp 08/01/17 1010 98.3 F (36.8 C)     Temp Source 08/01/17 1010 Oral     SpO2 08/01/17 1010 100 %     Weight 08/01/17 1010 159 lb (72.1 kg)     Height 08/01/17 1010 6\' 3"  (1.905 m)     Head Circumference --      Peak Flow --      Pain Score 08/01/17 1011 0   Constitutional: Alert and oriented. Well appearing and in no distress. Eyes: Conjunctivae are normal.  ENT   Head: Normocephalic and atraumatic.   Nose: No congestion/rhinnorhea.   Mouth/Throat: Mucous membranes are moist.   Neck: No stridor. Hematological/Lymphatic/Immunilogical: No cervical lymphadenopathy. Cardiovascular: Normal rate, regular rhythm.  No murmurs, rubs, or gallops.  Respiratory: Normal respiratory effort without tachypnea nor retractions. Breath sounds are clear and equal bilaterally. No wheezes/rales/rhonchi. Gastrointestinal: Soft and non tender. No rebound. No guarding.  Genitourinary: Deferred Musculoskeletal: Normal range of motion in all extremities.  Neurologic:  Normal speech and language. No gross focal neurologic deficits are appreciated.  Skin:  Skin is warm, dry and intact. No rash noted. Psychiatric: Mood and affect are normal. Speech and behavior are normal. Patient exhibits appropriate insight and judgment.  ____________________________________________    LABS (pertinent  positives/negatives)  None  ____________________________________________   EKG  None  ____________________________________________    RADIOLOGY  None  ____________________________________________   PROCEDURES  Procedures  ____________________________________________   INITIAL IMPRESSION / ASSESSMENT AND PLAN / ED COURSE  Pertinent labs & imaging results that were available during my care of the patient were reviewed by me and considered in my medical decision making (see chart for details).  Patient here for routine dialysis.  Complaining of some wheezing.  Lungs are clear.   ____________________________________________   FINAL CLINICAL IMPRESSION(S) / ED DIAGNOSES  Final diagnoses:  Dialysis patient [facility])     Note: This dictation was prepared with Dragon dictation. Any transcriptional errors that result from this process are unintentional     Nance Pear, MD  08/01/17 1133  

## 2017-08-01 NOTE — ED Notes (Signed)
Patient eating cheetos and drinking chocolate milk.  Patient states he is here for dialysis.  Patient is in no obvious distress at this time.

## 2017-08-01 NOTE — ED Notes (Signed)
Pt returned from dialysis unit. Pt is A&Ox4, eating pork rinds. No c/o discomfort of SOB at this time. Per dialysis RN report pt received dialysis for 3.5H and had 4L fluid dialyzed.

## 2017-08-01 NOTE — ED Notes (Signed)
Pt reports SOB, abdomen tight and distended. O2 sat 97%, RR28. Pt asking for paracentesis as soon as possible. Last treatment 07/24/17. Received call from Dialysis Supervisor Estill Bamberg stating that she has spoken to IR who can take pt for paracentesis today. Interventionist is in a meeting at the moment but states if order can be placed by another provider pt may go with ultrasound staff.

## 2017-08-01 NOTE — ED Notes (Signed)
Dialysis Dept called, they will call us back when ready for patient.

## 2017-08-01 NOTE — Progress Notes (Signed)
HD Tx started w/o complication.   08/01/17 1330  Hand-Off documentation  Report received from (Full Name) Alwyn Ren, RN   Vital Signs  Temp 98.3 F (36.8 C)  Temp Source Oral  Pulse Rate 82  Pulse Rate Source Monitor  Resp (!) 26  BP (!) 132/92  BP Location Left Arm  BP Method Automatic  Patient Position (if appropriate) Lying  Oxygen Therapy  SpO2 100 %  O2 Device Nasal Cannula  O2 Flow Rate (L/min) 2 L/min  Pulse Oximetry Type Continuous  Pain Assessment  Pain Scale 0-10  Pain Score 0  Time-Out for Hemodialysis  What Procedure? HD  Pt Identifiers(min of two) First/Last Name  Correct Site? Yes  Correct Side? Yes  Correct Procedure? Yes  Consents Verified? Yes  Rad Studies Available? N/A  Safety Precautions Reviewed? Yes  Engineer, civil (consulting) Number [phone]  Station Number 3  UF/Alarm Test Passed  Conductivity: Meter 14  Conductivity: Machine  14  pH 7.2  Reverse Osmosis Main  Normal Saline Lot Number [id number]  Dialyzer Lot Number [id number]  Disposable Set Lot Number [id number]  Machine Temperature 98.6 F (37 C)  Musician and Audible Yes  Blood Lines Intact and Secured Yes  Pre Treatment Patient Checks  Vascular access used during treatment Catheter  Hepatitis B Surface Antigen Results Negative  Date Hepatitis B Surface Antigen Drawn 06/16/17  Hepatitis B Surface Antibody  (>10)  Date Hepatitis B Surface Antibody Drawn 02/28/18  Hemodialysis Consent Verified Yes  Hemodialysis Standing Orders Initiated Yes  ECG (Telemetry) Monitor On Yes  Prime Ordered Normal Saline  Length of  DialysisTreatment -hour(s) 3.5 Hour(s)  Dialysis Treatment Comments Na 140  Dialyzer Elisio 17H NR  Dialysate 2K, 2.5 Ca  Dialysis Anticoagulant None  Dialysate Flow Ordered 800  Blood Flow Rate Ordered 400 mL/min  Ultrafiltration Goal 3.5 Liters  Dialysis Blood Pressure Support Ordered Normal Saline  During Hemodialysis Assessment  Blood Flow Rate (mL/min) 400  mL/min  Arterial Pressure (mmHg) -150 mmHg  Venous Pressure (mmHg) 160 mmHg  Transmembrane Pressure (mmHg) 80 mmHg  Ultrafiltration Rate (mL/min) 1500 mL/min  Dialysate Flow Rate (mL/min) 800 ml/min  Conductivity: Machine  14.2  HD Safety Checks Performed Yes  Dialysis Fluid Bolus Normal Saline  Bolus Amount (mL) 250 mL  Intra-Hemodialysis Comments Tx initiated  Education / Care Plan  Dialysis Education Provided Yes  Documented Education in Care Plan Yes  Hemodialysis Catheter Right Internal jugular Double-lumen  No Placement Date or Time found.   Placed prior to admission: Yes  Orientation: Right  Access Location: Internal jugular  Hemodialysis Catheter Type: Double-lumen  Site Condition No complications  Blue Lumen Status Flushed  Red Lumen Status Flushed  Dressing Type Biopatch;Occlusive  Dressing Status Clean;Dry;Intact  Dressing Change Due 08/02/17

## 2017-08-01 NOTE — ED Notes (Signed)
Patient ambulatory to [facility], Vicente Males RN aware.

## 2017-08-01 NOTE — ED Provider Notes (Signed)
-----------------------------------------   5:09 PM on 08/01/2017 -----------------------------------------  Patient has returned from dialysis.  He has no complaints at this time.  States he is ready to go home.  Clear lung sounds, normal heart sounds.  We will discharge the patient from the emergency department.   Harvest Dark, MD 08/01/17 [phone]

## 2017-08-01 NOTE — ED Notes (Signed)
Dr. [name] at bedside to assess patient. Pt placed on 2L via [place] for comfort measures per Dr. [name]. VS obtained by this RN, Elmo Putt, RN made aware. Pt transported to dialysis by Catalina Antigua, Therapist, sports.

## 2017-08-01 NOTE — ED Notes (Signed)

## 2017-08-01 NOTE — ED Notes (Addendum)
Spoke to Dr. Archie Balboa regarding pt status. Per MD no order for paracentesis will be placed at this time, pt awaiting dialysis services at this time. EDP notified pt also c/o heartburn. Order received for GI cocktail.

## 2017-08-01 NOTE — Progress Notes (Signed)
Pre HD assessment   08/01/17 1315  Neurological  Level of Consciousness Alert  Orientation Level Oriented X4  Respiratory  Respiratory Pattern Regular;Labored  Chest Assessment Chest expansion symmetrical  Cough Non-productive  Cardiac  Pulse Regular  Heart Sounds S1, S2  ECG Monitor Yes  Vascular  R Radial Pulse +2  L Radial Pulse +2  Edema Generalized  Psychosocial  Psychosocial (WDL) WDL

## 2017-08-01 NOTE — Progress Notes (Signed)
HD treatment complete report called to [facility], RN@[phone]  transport to ER for discharge requested    08/01/17 1630  Vital Signs  Pulse Rate 93  Resp 17  BP 106/79  Oxygen Therapy  SpO2 99 %  Post-Hemodialysis Assessment  Rinseback Volume (mL) 250 mL  Dialyzer Clearance Lightly streaked  Duration of HD Treatment -hour(s) 3.5 hour(s)  Hemodialysis Intake (mL) 500 mL  UF Total -Machine (mL) 4500 mL  Net UF (mL) 4000 mL  Tolerated HD Treatment Yes  Post-Hemodialysis Comments Pt tolerated hd  Education / Care Plan  Dialysis Education Provided Yes  Note  Observations Pt. tolerated hd  Hemodialysis Catheter Right Internal jugular Double-lumen  No Placement Date or Time found.   Placed prior to admission: Yes  Orientation: Right  Access Location: Internal jugular  Hemodialysis Catheter Type: Double-lumen  Site Condition No complications  Blue Lumen Status Capped (Central line)  Red Lumen Status Capped (Central line)  Purple Lumen Status Heparin locked  Dressing Type Occlusive  Dressing Change Due 08/02/17

## 2017-08-01 NOTE — ED Notes (Signed)
Patient appears to be sleeping at this time.

## 2017-08-04 ENCOUNTER — Non-Acute Institutional Stay
Admission: EM | Admit: 2017-08-04 | Discharge: 2017-08-06 | Discharge status: Against Medical Advice | Payer: Medicare Other | Attending: Emergency Medicine | Admitting: Emergency Medicine

## 2017-08-04 ENCOUNTER — Encounter: Payer: Self-pay | Admitting: Emergency Medicine

## 2017-08-04 DIAGNOSIS — N186 End stage renal disease: Secondary | ICD-10-CM | POA: Insufficient documentation

## 2017-08-04 DIAGNOSIS — I2510 Atherosclerotic heart disease of native coronary artery without angina pectoris: Secondary | ICD-10-CM | POA: Diagnosis not present

## 2017-08-04 DIAGNOSIS — Z992 Dependence on renal dialysis: Secondary | ICD-10-CM | POA: Diagnosis not present

## 2017-08-04 DIAGNOSIS — Z5321 Procedure and treatment not carried out due to patient leaving prior to being seen by health care provider: Secondary | ICD-10-CM | POA: Insufficient documentation

## 2017-08-04 DIAGNOSIS — Z9889 Other specified postprocedural states: Secondary | ICD-10-CM | POA: Insufficient documentation

## 2017-08-04 DIAGNOSIS — I739 Peripheral vascular disease, unspecified: Secondary | ICD-10-CM | POA: Diagnosis not present

## 2017-08-04 DIAGNOSIS — I132 Hypertensive heart and chronic kidney disease with heart failure and with stage 5 chronic kidney disease, or end stage renal disease: Secondary | ICD-10-CM | POA: Insufficient documentation

## 2017-08-04 DIAGNOSIS — D123 Benign neoplasm of transverse colon: Secondary | ICD-10-CM | POA: Insufficient documentation

## 2017-08-04 DIAGNOSIS — K219 Gastro-esophageal reflux disease without esophagitis: Secondary | ICD-10-CM | POA: Diagnosis not present

## 2017-08-04 DIAGNOSIS — I509 Heart failure, unspecified: Secondary | ICD-10-CM | POA: Insufficient documentation

## 2017-08-04 DIAGNOSIS — R5383 Other fatigue: Secondary | ICD-10-CM | POA: Diagnosis not present

## 2017-08-04 DIAGNOSIS — F17210 Nicotine dependence, cigarettes, uncomplicated: Secondary | ICD-10-CM | POA: Insufficient documentation

## 2017-08-04 DIAGNOSIS — D631 Anemia in chronic kidney disease: Secondary | ICD-10-CM | POA: Insufficient documentation

## 2017-08-04 DIAGNOSIS — E785 Hyperlipidemia, unspecified: Secondary | ICD-10-CM | POA: Diagnosis not present

## 2017-08-04 DIAGNOSIS — R188 Other ascites: Secondary | ICD-10-CM | POA: Insufficient documentation

## 2017-08-04 DIAGNOSIS — E875 Hyperkalemia: Secondary | ICD-10-CM | POA: Diagnosis not present

## 2017-08-04 DIAGNOSIS — R140 Abdominal distension (gaseous): Secondary | ICD-10-CM | POA: Insufficient documentation

## 2017-08-04 DIAGNOSIS — I272 Pulmonary hypertension, unspecified: Secondary | ICD-10-CM | POA: Insufficient documentation

## 2017-08-04 DIAGNOSIS — I120 Hypertensive chronic kidney disease with stage 5 chronic kidney disease or end stage renal disease: Secondary | ICD-10-CM | POA: Diagnosis not present

## 2017-08-04 DIAGNOSIS — K7460 Unspecified cirrhosis of liver: Secondary | ICD-10-CM | POA: Diagnosis not present

## 2017-08-04 DIAGNOSIS — Z8709 Personal history of other diseases of the respiratory system: Secondary | ICD-10-CM | POA: Diagnosis not present

## 2017-08-04 DIAGNOSIS — F1011 Alcohol abuse, in remission: Secondary | ICD-10-CM | POA: Diagnosis not present

## 2017-08-04 DIAGNOSIS — E43 Unspecified severe protein-calorie malnutrition: Secondary | ICD-10-CM | POA: Diagnosis not present

## 2017-08-04 LAB — CBC
HCT: 27.9 % — ABNORMAL LOW (ref 40.0–52.0)
Hemoglobin: 8.3 g/dL — ABNORMAL LOW (ref 13.0–18.0)
MCH: 24.8 pg — AB (ref 26.0–34.0)
MCHC: 29.9 g/dL — ABNORMAL LOW (ref 32.0–36.0)
MCV: 82.8 fL (ref 80.0–100.0)
PLATELETS: 351 10*3/uL (ref 150–440)
RBC: 3.36 MIL/uL — AB (ref 4.40–5.90)
RDW: 19.4 % — ABNORMAL HIGH (ref 11.5–14.5)
WBC: 7.4 10*3/uL (ref 3.8–10.6)

## 2017-08-04 LAB — RENAL FUNCTION PANEL
ALBUMIN: 2.8 g/dL — AB (ref 3.5–5.0)
ANION GAP: 8 (ref 5–15)
BUN: 79 mg/dL — ABNORMAL HIGH (ref 6–20)
CALCIUM: 8.9 mg/dL (ref 8.9–10.3)
CO2: 28 mmol/L (ref 22–32)
CREATININE: 8.56 mg/dL — AB (ref 0.61–1.24)
Chloride: 101 mmol/L (ref 101–111)
GFR, EST AFRICAN AMERICAN: 7 mL/min — AB (ref >60)
GFR, EST NON AFRICAN AMERICAN: 6 mL/min — AB (ref >60)
Glucose, Bld: 91 mg/dL (ref 65–99)
PHOSPHORUS: 3.8 mg/dL (ref 2.5–4.6)
Potassium: 6.4 mmol/L (ref 3.5–5.1)
SODIUM: 137 mmol/L (ref 135–145)

## 2017-08-04 MED ORDER — CHLORHEXIDINE GLUCONATE CLOTH 2 % EX PADS
6.0000 | MEDICATED_PAD | Freq: Every day | CUTANEOUS | Status: DC
  Filled 2017-08-04 (×2): qty 6

## 2017-08-04 MED ORDER — SODIUM CHLORIDE 0.9 % IV SOLN
200.0000 mg | Freq: Once | INTRAVENOUS | Status: DC
  Filled 2017-08-04: qty 10

## 2017-08-04 MED ORDER — DIPHENHYDRAMINE HCL 50 MG/ML IJ SOLN: 12.5000 mg | Freq: Once | INTRAMUSCULAR | Status: DC

## 2017-08-04 MED ORDER — ALBUMIN HUMAN 25 % IV SOLN
12.5000 g | Freq: Once | INTRAVENOUS | Status: DC
  Filled 2017-08-04: qty 50

## 2017-08-04 MED ORDER — EPOETIN ALFA 10000 UNIT/ML IJ SOLN
10000.0000 [IU] | Freq: Once | INTRAMUSCULAR | Status: AC
  Administered 2017-08-04: 10000 [IU] via INTRAVENOUS

## 2017-08-04 NOTE — ED Notes (Signed)
Dialysis informed secretary that it would be approx. 2.5 hours before able to get patient for dialysis.  Patient informed of time, states will wait, given blanket, calm and cooperative at this time.

## 2017-08-04 NOTE — Progress Notes (Signed)
This note also relates to the following rows which could not be included: Pulse Rate - Cannot attach notes to unvalidated device data Resp - Cannot attach notes to unvalidated device data BP - Cannot attach notes to unvalidated device data  Hd completed  

## 2017-08-04 NOTE — ED Notes (Signed)
Patient states here for regular dialysis.  Denies any pain, SOB, or new changes.  Chest rise even and unlabored, skin warm and dry.

## 2017-08-04 NOTE — Progress Notes (Signed)
[facility]  ROUNDING NOTE   Subjective:    Seen in the emergency room.  Patient presents for volume overload and is in need of dialysis Abdomen is distended Very lethargic today   Objective:  Vital signs in last 24 hours:  Temp:  [98.5 F (36.9 C)-98.8 F (37.1 C)] 98.8 F (37.1 C) (05/24 1420) Pulse Rate:  [79-88] 79 (05/24 1500) Resp:  [16] 16 (05/24 1500) BP: (115-126)/(65-85) 115/65 (05/24 1500) SpO2:  [98 %-100 %] 100 % (05/24 1420) Weight:  [72.1 kg (159 lb)] 72.1 kg (159 lb) (05/24 1135)  Weight change:  Filed Weights   08/04/17 1135  Weight: 72.1 kg (159 lb)    Intake/Output: No intake/output data recorded.   Intake/Output this shift:  No intake/output data recorded.  Physical Exam: General: NAD, lying in the bed  Head: Normocephalic, atraumatic. Moist oral mucosal membranes  Eyes: Anicteric  Neck: Supple  Lungs:  normal effort, b/l mild crackles  Heart: Regular rate and rhythm  Abdomen:  Soft, nontender, distended  Extremities:  2+ peripheral edema.  Neurologic: Nonfocal, moving all four extremities  Skin: No lesions  Access: RUE AVF - no bruit or thrill, RIJ permcath    Basic Metabolic Panel: No results for input(s): NA, K, CL, CO2, GLUCOSE, BUN, CREATININE, CALCIUM, MG, PHOS in the last 168 hours.  Liver Function Tests: No results for input(s): AST, ALT, ALKPHOS, BILITOT, PROT, ALBUMIN in the last 168 hours. No results for input(s): LIPASE, AMYLASE in the last 168 hours. No results for input(s): AMMONIA in the last 168 hours.  CBC: No results for input(s): WBC, NEUTROABS, HGB, HCT, MCV, PLT in the last 168 hours.  Cardiac Enzymes: No results for input(s): CKTOTAL, CKMB, CKMBINDEX, TROPONINI in the last 168 hours.  BNP: Invalid input(s): POCBNP  CBG: No results for input(s): GLUCAP in the last 168 hours.  Microbiology: Results for orders placed or performed during the hospital encounter of 07/12/17  Culture, blood (Routine  X 2) w Reflex to ID Panel     Status: None   Collection Time: 07/12/17  4:43 PM  Result Value Ref Range Status   Specimen Description BLOOD LINE CENTRAL  Final   Special Requests   Final    BOTTLES DRAWN AEROBIC AND ANAEROBIC Blood Culture adequate volume   Culture   Final    NO GROWTH 5 DAYS Performed at [facility], [address] [place], [place] [address]    Report Status 07/17/2017 FINAL  Final    Coagulation Studies: No results for input(s): LABPROT, INR in the last 72 hours.  Urinalysis: No results for input(s): COLORURINE, LABSPEC, PHURINE, GLUCOSEU, HGBUR, BILIRUBINUR, KETONESUR, PROTEINUR, UROBILINOGEN, NITRITE, LEUKOCYTESUR in the last 72 hours.  Invalid input(s): APPERANCEUR    Imaging: No results found.   Medications:       Assessment/ Plan:  Stanley [name] is a 57 y.o. black male withend stage renal disease on hemodialysis secondary to Alport's syndrome, ascites, hypertension, anemia of chronic kidney disease, coronary artery disease, peripheral vascular disease, hyperlipidemia, gastrointestinal AVMs, pulmonary hypertension,   nooutpatientdialysis unit currently  1.  Hyperkalemia Hemodialysis treatment with 2 K    2.  End-stage renal disease: Tentative MWF schedule as patient has no outpatient dialysis unit currently.    3. Anemia with chronic kidney disease: hemoglobin 7.5 EPO, IV iron with HD.  Schedule on Friday  4. Dialysis device complication: AVF nonfunction. Currently using RIJ permcath Needs evaluation for a new access. Patient to f/u  with outpatient vascular office  5.   Ascites: -  ultrasound guided large volume paracentesis as necessary - Last paracentesis done on 5/13   LOS: 0 Lexi Conaty [name] 5/24/20193:48 PM

## 2017-08-04 NOTE — Progress Notes (Signed)
Hd started  

## 2017-08-04 NOTE — ED Triage Notes (Signed)
Pt here for dialysis to be completed.  Patient denies any problems at this time and is in NAD.

## 2017-08-04 NOTE — ED Provider Notes (Signed)
-----------------------------------------   5:30 PM on 08/04/2017 -----------------------------------------  Recent came back from dialysis, voiced his significant dissatisfaction that his discharge reports were not ready and waiting for him and then went out the front door.  For some reason, we are doing dialysis on this patient through the emergency department and this is a normal pattern for him.  I was unable to perform a post dialysis exam on this person although I do not see what the point that would be anyway.  Please see prior note from Dr. Jimmye Norman for his H&P.   Schuyler Amor, MD 08/04/17 [phone]

## 2017-08-04 NOTE — ED Provider Notes (Signed)
[facility] Emergency Department Provider Note       Time seen: ----------------------------------------- 12:27 PM on 08/04/2017 -----------------------------------------   I have reviewed the triage vital signs and the nursing notes.  HISTORY   Chief Complaint Dialysis    HPI Stanley [name] is a 57 y.o. male with a history of alcohol abuse, CHF, cirrhosis, drug abuse, end-stage renal disease on dialysis every Tuesday, Thursday and Saturday who presents to the ED for his normal dialysis.  He denies any pain, shortness of breath, weakness or other complaints.  Past Medical History:  Diagnosis Date  . Alcohol abuse   . CHF (congestive heart failure) ([place])   . Cirrhosis ([place])   . Coronary artery disease 2009  . Drug abuse ([place])   . End stage renal disease on dialysis [facility]) NEPHROLOGIST-   DR [facility]  IN [place]   HEMODIALYSIS --   TUES/  THURS/  SAT  . Gastrointestinal bleed 06/13/2017   From chart...hx of multiple GI bleeds  . GERD (gastroesophageal reflux disease)   . Hyperlipidemia   . Hypertension   . PAD (peripheral artery disease) ([place])   . Renal insufficiency    Per pt, 32 oz fluid restriction per day  . S/P triple vessel bypass 06/09/2016   2009ish  . Suicidal ideation    & HOMICIDAL IDEATION --  06-16-2013   ADMITTED TO BEHAVIOR HEALTH    Patient Active Problem List   Diagnosis Date Noted  . ESRD (end stage renal disease) on dialysis ([place]) 07/28/2017  . Protein-calorie malnutrition, severe 06/14/2017  . Encounter for dialysis [facility]   . Palliative care by specialist   . Goals of care, counseling/discussion   . Malnutrition of moderate degree 06/05/2017  . Secondary esophageal varices without bleeding ([place])   . Stomach irritation   . Idiopathic esophageal varices without bleeding ([place])   . Alcoholic hepatitis with ascites 05/24/2017  . ESRD (end stage renal disease) ([place]) 04/28/2017  . Uremia 03/08/2017  . ESRD on hemodialysis  ([place]) 03/03/2017  . Weakness 02/28/2017  . Hypocalcemia 02/22/2017  . Shortness of breath 11/26/2016  . COPD (chronic obstructive pulmonary disease) ([place]) 10/30/2016  . COPD exacerbation ([place]) 10/29/2016  . Anemia   . Heme positive stool   . Ulceration of intestine   . Benign neoplasm of transverse colon   . Acute gastrointestinal hemorrhage   . Esophageal candidiasis ([place])   . Angiodysplasia of intestinal tract   . Acute respiratory failure with hypoxia ([place]) 07/03/2016  . GI bleeding 06/24/2016  . Rectal bleeding 06/14/2016  . Anemia of chronic disease 06/01/2016  . MRSA carrier 06/01/2016  . Chronic renal failure 05/23/2016  . Ischemic heart disease 05/23/2016  . Angiodysplasia of small intestine   . Melena   . Small bowel bleed not requiring more than 4 units of blood in 24 hours, ICU, or surgery   . Anemia due to chronic blood loss   . Abdominal pain 05/05/2016  . Acute posthemorrhagic anemia 04/17/2016  . Gastrointestinal bleed 04/17/2016  . History of esophagogastroduodenoscopy (EGD) 04/17/2016  . Elevated troponin 04/17/2016  . Alcohol abuse 04/17/2016  . Upper GI bleed 01/19/2016  . Blood in stool   . Angiodysplasia of stomach and duodenum with hemorrhage   . Gastritis   . Reflux esophagitis   . GI bleed 05/16/2015  . Acute GI bleeding   . Symptomatic anemia 04/30/2015  . HTN (hypertension) 04/06/2015  . GERD (gastroesophageal reflux disease) 04/06/2015  . HLD (hyperlipidemia) 04/06/2015  .  Dyspnea 04/06/2015  . Cirrhosis of liver with ascites ([place]) 04/06/2015  . Ascites 04/06/2015  . GIB (gastrointestinal bleeding) 03/23/2015  . Homicidal ideation 06/19/2013  . Suicidal intent 06/19/2013  . Homicidal ideations 06/19/2013  . Hyperkalemia 06/16/2013  . Mandible fracture ([place]) 06/05/2013  . Fracture, mandible ([place]) 06/02/2013  . Coronary atherosclerosis of native coronary artery 06/02/2013  . ESRD on dialysis ([place]) 06/02/2013  . Mandible open fracture ([place])  06/02/2013    Past Surgical History:  Procedure Laterality Date  . A/V FISTULAGRAM Right 06/06/2017   Procedure: A/V FISTULAGRAM;  Surgeon: Katha Cabal, MD;  Location: [facility];  Service: Cardiovascular;  Laterality: Right;  . A/V SHUNT INTERVENTION N/A 06/06/2017   Procedure: A/V SHUNT INTERVENTION;  Surgeon: Katha Cabal, MD;  Location: [place];  Service: Cardiovascular;  Laterality: N/A;  . AGILE CAPSULE N/A 06/19/2016   Procedure: AGILE CAPSULE;  Surgeon: [name], MD;  Location: [facility] ENDOSCOPY;  Service: Endoscopy;  Laterality: N/A;  . COLONOSCOPY WITH PROPOFOL N/A 06/18/2016   Procedure: COLONOSCOPY WITH PROPOFOL;  Surgeon: [name], MD;  Location: [facility] ENDOSCOPY;  Service: Endoscopy;  Laterality: N/A;  . COLONOSCOPY WITH PROPOFOL N/A 08/12/2016   Procedure: COLONOSCOPY WITH PROPOFOL;  Surgeon: [name], MD;  Location: [facility] ENDOSCOPY;  Service: Endoscopy;  Laterality: N/A;  . COLONOSCOPY WITH PROPOFOL N/A 05/05/2017   Procedure: COLONOSCOPY WITH PROPOFOL;  Surgeon: Manya Silvas, MD;  Location: [facility] ENDOSCOPY;  Service: Endoscopy;  Laterality: N/A;  . CORONARY ANGIOPLASTY  ?   PT UNABLE TO TELL IF  BEFORE OR AFTER  CABG  . CORONARY ARTERY BYPASS GRAFT  2008  ([place] , [place])   3 VESSEL  . DIALYSIS FISTULA CREATION  LAST SURGERY  APPOX  2008  . ENTEROSCOPY N/A 05/10/2016   Procedure: ENTEROSCOPY;  Surgeon: Jerene Bears, MD;  Location: [facility];  Service: Gastroenterology;  Laterality: N/A;  . ENTEROSCOPY N/A 08/12/2016   Procedure: ENTEROSCOPY;  Surgeon: [name], MD;  Location: [facility] ENDOSCOPY;  Service: Endoscopy;  Laterality: N/A;  . ENTEROSCOPY Left 06/03/2017   Procedure: ENTEROSCOPY;  Surgeon: Virgel Manifold, MD;  Location: [facility] ENDOSCOPY;  Service: Endoscopy;  Laterality: Left;  Procedure date will ultimately depend on when patient is medically optimized before the procedure, pending hemodialysis and blood transfusions etc. Will  place on schedule and change depending on clinical status.   . ENTEROSCOPY N/A 06/05/2017   Procedure: ENTEROSCOPY;  Surgeon: Virgel Manifold, MD;  Location: [facility] ENDOSCOPY;  Service: Endoscopy;  Laterality: N/A;  . ENTEROSCOPY N/A 06/15/2017   Procedure: Push ENTEROSCOPY;  Surgeon: [name], MD;  Location: [facility] ENDOSCOPY;  Service: Endoscopy;  Laterality: N/A;  . ESOPHAGOGASTRODUODENOSCOPY N/A 05/07/2015   Procedure: ESOPHAGOGASTRODUODENOSCOPY (EGD);  Surgeon: Hulen Luster, MD;  Location: [facility] ENDOSCOPY;  Service: Endoscopy;  Laterality: N/A;  . ESOPHAGOGASTRODUODENOSCOPY (EGD) WITH PROPOFOL N/A 05/17/2015   Procedure: ESOPHAGOGASTRODUODENOSCOPY (EGD) WITH PROPOFOL;  Surgeon: [name], MD;  Location: [facility] ENDOSCOPY;  Service: Endoscopy;  Laterality: N/A;  . ESOPHAGOGASTRODUODENOSCOPY (EGD) WITH PROPOFOL N/A 01/20/2016   Procedure: ESOPHAGOGASTRODUODENOSCOPY (EGD) WITH PROPOFOL;  Surgeon: [name], MD;  Location: [facility] ENDOSCOPY;  Service: Endoscopy;  Laterality: N/A;  . ESOPHAGOGASTRODUODENOSCOPY (EGD) WITH PROPOFOL N/A 04/17/2016   Procedure: ESOPHAGOGASTRODUODENOSCOPY (EGD) WITH PROPOFOL;  Surgeon: Lin Landsman, MD;  Location: [facility] ENDOSCOPY;  Service: Gastroenterology;  Laterality: N/A;  . ESOPHAGOGASTRODUODENOSCOPY (EGD) WITH PROPOFOL  05/09/2016   Procedure: ESOPHAGOGASTRODUODENOSCOPY (EGD) WITH PROPOFOL;  Surgeon: Jerene Bears, MD;  Location: [facility] ENDOSCOPY;  Service: Endoscopy;;  . ESOPHAGOGASTRODUODENOSCOPY (EGD) WITH PROPOFOL N/A 06/16/2016   Procedure: ESOPHAGOGASTRODUODENOSCOPY (EGD) WITH PROPOFOL;  Surgeon: [name], MD;  Location: [facility] ENDOSCOPY;  Service: Endoscopy;  Laterality: N/A;  . ESOPHAGOGASTRODUODENOSCOPY (EGD) WITH PROPOFOL N/A 05/05/2017   Procedure: ESOPHAGOGASTRODUODENOSCOPY (EGD) WITH PROPOFOL;  Surgeon: Manya Silvas, MD;  Location[facility] ENDOSCOPY;  Service: Endoscopy;  Laterality: N/A;  . ESOPHAGOGASTRODUODENOSCOPY (EGD) WITH PROPOFOL N/A 06/15/2017    Procedure: ESOPHAGOGASTRODUODENOSCOPY (EGD) WITH PROPOFOL;  Surgeon: [name], MD;  Location: [facility] ENDOSCOPY;  Service: Endoscopy;  Laterality: N/A;  . GIVENS CAPSULE STUDY N/A 05/07/2016   Procedure: GIVENS CAPSULE STUDY;  Surgeon: Doran Stabler, MD;  Location: [place];  Service: Endoscopy;  Laterality: N/A;  . MANDIBULAR HARDWARE REMOVAL N/A 07/29/2013   Procedure: REMOVAL OF ARCH BARS;  Surgeon: Theodoro Kos, DO;  Location: [place];  Service: Plastics;  Laterality: N/A;  . ORIF MANDIBULAR FRACTURE N/A 06/05/2013   Procedure: REPAIR OF MANDIBULAR FRACTURE x 2 with maxillo-mandibular fixation ;  Surgeon: Theodoro Kos, DO;  Location: [place];  Service: Plastics;  Laterality: N/A;  . PERIPHERAL ARTERIAL STENT GRAFT Left     Allergies Patient has no known allergies.  Social History Social History   Tobacco Use  . Smoking status: Current Every Day Smoker    Packs/day: 0.15    Years: 40.00    Pack years: 6.00    Types: Cigarettes  . Smokeless tobacco: Never Used  Substance Use Topics  . Alcohol use: No    Comment: pt reports quitting after learning about cirrhosis  . Drug use: No    Frequency: 7.0 times per week    Types: Marijuana, Cocaine   Review of Systems Constitutional: Negative for fever. Cardiovascular: Negative for chest pain. Respiratory: Negative for shortness of breath. Gastrointestinal: Negative for abdominal pain, vomiting and diarrhea. Musculoskeletal: Negative for back pain. Skin: Negative for rash. Neurological: Negative for headaches, focal weakness or numbness.  All systems negative/normal/unremarkable except as stated in the HPI  ____________________________________________   PHYSICAL EXAM:  VITAL SIGNS: ED Triage Vitals  Enc Vitals Group     BP 08/04/17 1141 115/67     Pulse Rate 08/04/17 1141 88     Resp --      Temp 08/04/17 1141 98.5 F (36.9 C)     Temp Source 08/04/17 1141 Oral     SpO2 08/04/17 1141 98 %      Weight 08/04/17 1135 159 lb (72.1 kg)     Height 08/04/17 1135 6\' 3"  (1.905 m)     Head Circumference --      Peak Flow --      Pain Score 08/04/17 1135 0     Pain Loc --      Pain Edu? --      Excl. in [place]? --    Constitutional: Drowsy but oriented.  Well appearing and in no distress. Eyes: Conjunctivae are normal. Normal extraocular movements. Cardiovascular: Normal rate, regular rhythm. No murmurs, rubs, or gallops. Respiratory: Normal respiratory effort without tachypnea nor retractions. Breath sounds are clear and equal bilaterally. No wheezes/rales/rhonchi. Gastrointestinal: Soft and nontender. Normal bowel sounds Musculoskeletal: Nontender with normal range of motion in extremities. No lower extremity tenderness nor edema. Neurologic:  Normal speech and language. No gross focal neurologic deficits are appreciated.  Skin:  Skin is warm, dry and intact. No rash noted. Psychiatric: Mood and affect are normal. Speech and behavior are normal.  ____________________________________________  ED  COURSE:  As part of my medical decision making, I reviewed the following data within the [place] History obtained from family if available, nursing notes, old chart and ekg, as well as notes from prior ED visits. Patient presented for dialysis, appears medically cleared for same.   Procedures ____________________________________________  DIFFERENTIAL DIAGNOSIS   End-stage renal disease, electrolyte abnormality, occult infection  FINAL ASSESSMENT AND PLAN  End-stage renal disease on dialysis   Plan: The patient had presented for dialysis.  Patient appears medically clear for dialysis at this time.   Laurence Aly, MD   Note: This note was generated in part or whole with voice recognition software. Voice recognition is usually quite accurate but there are transcription errors that can and very often do occur. I apologize for any typographical errors that were  not detected and corrected.     Earleen Newport, MD 08/04/17 1228

## 2017-08-06 DIAGNOSIS — E875 Hyperkalemia: Secondary | ICD-10-CM | POA: Diagnosis not present

## 2017-08-08 ENCOUNTER — Other Ambulatory Visit: Payer: Self-pay

## 2017-08-08 ENCOUNTER — Encounter: Payer: Self-pay | Admitting: Emergency Medicine

## 2017-08-08 ENCOUNTER — Non-Acute Institutional Stay
Admission: EM | Admit: 2017-08-08 | Discharge: 2017-08-08 | Disposition: A | Discharge status: Home / Self Care | Payer: Medicare Other | Attending: Emergency Medicine | Admitting: Emergency Medicine

## 2017-08-08 DIAGNOSIS — I272 Pulmonary hypertension, unspecified: Secondary | ICD-10-CM | POA: Insufficient documentation

## 2017-08-08 DIAGNOSIS — R188 Other ascites: Secondary | ICD-10-CM | POA: Diagnosis not present

## 2017-08-08 DIAGNOSIS — N186 End stage renal disease: Secondary | ICD-10-CM | POA: Insufficient documentation

## 2017-08-08 DIAGNOSIS — Z79899 Other long term (current) drug therapy: Secondary | ICD-10-CM | POA: Diagnosis not present

## 2017-08-08 DIAGNOSIS — I132 Hypertensive heart and chronic kidney disease with heart failure and with stage 5 chronic kidney disease, or end stage renal disease: Secondary | ICD-10-CM | POA: Diagnosis not present

## 2017-08-08 DIAGNOSIS — D631 Anemia in chronic kidney disease: Secondary | ICD-10-CM | POA: Insufficient documentation

## 2017-08-08 DIAGNOSIS — F17210 Nicotine dependence, cigarettes, uncomplicated: Secondary | ICD-10-CM | POA: Diagnosis not present

## 2017-08-08 DIAGNOSIS — I10 Essential (primary) hypertension: Secondary | ICD-10-CM | POA: Diagnosis not present

## 2017-08-08 DIAGNOSIS — I2510 Atherosclerotic heart disease of native coronary artery without angina pectoris: Secondary | ICD-10-CM | POA: Diagnosis not present

## 2017-08-08 DIAGNOSIS — J449 Chronic obstructive pulmonary disease, unspecified: Secondary | ICD-10-CM | POA: Diagnosis not present

## 2017-08-08 DIAGNOSIS — I739 Peripheral vascular disease, unspecified: Secondary | ICD-10-CM | POA: Insufficient documentation

## 2017-08-08 DIAGNOSIS — E785 Hyperlipidemia, unspecified: Secondary | ICD-10-CM | POA: Insufficient documentation

## 2017-08-08 DIAGNOSIS — Z992 Dependence on renal dialysis: Secondary | ICD-10-CM | POA: Diagnosis not present

## 2017-08-08 DIAGNOSIS — Z7951 Long term (current) use of inhaled steroids: Secondary | ICD-10-CM | POA: Diagnosis not present

## 2017-08-08 DIAGNOSIS — I509 Heart failure, unspecified: Secondary | ICD-10-CM | POA: Insufficient documentation

## 2017-08-08 DIAGNOSIS — Z22322 Carrier or suspected carrier of Methicillin resistant Staphylococcus aureus: Secondary | ICD-10-CM | POA: Diagnosis not present

## 2017-08-08 DIAGNOSIS — E875 Hyperkalemia: Secondary | ICD-10-CM | POA: Diagnosis not present

## 2017-08-08 DIAGNOSIS — K219 Gastro-esophageal reflux disease without esophagitis: Secondary | ICD-10-CM | POA: Insufficient documentation

## 2017-08-08 LAB — CBC
HEMATOCRIT: 26.1 % — AB (ref 40.0–52.0)
HEMOGLOBIN: 8 g/dL — AB (ref 13.0–18.0)
MCH: 25 pg — AB (ref 26.0–34.0)
MCHC: 30.8 g/dL — AB (ref 32.0–36.0)
MCV: 81.4 fL (ref 80.0–100.0)
Platelets: 321 10*3/uL (ref 150–440)
RBC: 3.2 MIL/uL — ABNORMAL LOW (ref 4.40–5.90)
RDW: 19.8 % — ABNORMAL HIGH (ref 11.5–14.5)
WBC: 5.7 10*3/uL (ref 3.8–10.6)

## 2017-08-08 LAB — RENAL FUNCTION PANEL
ALBUMIN: 2.6 g/dL — AB (ref 3.5–5.0)
Anion gap: 10 (ref 5–15)
BUN: 39 mg/dL — AB (ref 6–20)
CHLORIDE: 101 mmol/L (ref 101–111)
CO2: 29 mmol/L (ref 22–32)
Calcium: 8.4 mg/dL — ABNORMAL LOW (ref 8.9–10.3)
Creatinine, Ser: 5.11 mg/dL — ABNORMAL HIGH (ref 0.61–1.24)
GFR calc Af Amer: 13 mL/min — ABNORMAL LOW (ref >60)
GFR, EST NON AFRICAN AMERICAN: 11 mL/min — AB (ref >60)
GLUCOSE: 97 mg/dL (ref 65–99)
POTASSIUM: 4.6 mmol/L (ref 3.5–5.1)
Phosphorus: 3.1 mg/dL (ref 2.5–4.6)
Sodium: 140 mmol/L (ref 135–145)

## 2017-08-08 LAB — PHOSPHORUS: Phosphorus: 3.2 mg/dL (ref 2.5–4.6)

## 2017-08-08 MED ORDER — EPOETIN ALFA 10000 UNIT/ML IJ SOLN
10000.0000 [IU] | Freq: Once | INTRAMUSCULAR | Status: AC
  Administered 2017-08-08: 10000 [IU] via INTRAVENOUS

## 2017-08-08 MED ORDER — CHLORHEXIDINE GLUCONATE CLOTH 2 % EX PADS: 6.0000 | MEDICATED_PAD | Freq: Every day | CUTANEOUS | Status: DC

## 2017-08-08 MED ORDER — SODIUM CHLORIDE 0.9 % IV SOLN: 100.0000 mL | INTRAVENOUS | Status: DC | PRN

## 2017-08-08 NOTE — ED Notes (Addendum)
Pt back from dialysis, asking how much longer he was going to be here.  This RN informed him that registration was working on moving him back to ED board and EDP was working on his paper work.  Received phone call thereafter that patient had left.  Discharge vitals and electronic signature not obtained due to this reason.

## 2017-08-08 NOTE — Progress Notes (Signed)
HD Tx completed, Uf goal met, pt stable.    08/08/17 1430  Vital Signs  Pulse Rate 88  Pulse Rate Source Monitor  Resp 17  BP 114/80  BP Location Left Arm  BP Method Automatic  Patient Position (if appropriate) Lying  Oxygen Therapy  SpO2 100 %  O2 Device Room Air  During Hemodialysis Assessment  Blood Flow Rate (mL/min) 400 mL/min  Arterial Pressure (mmHg) -120 mmHg  Venous Pressure (mmHg) 100 mmHg  Transmembrane Pressure (mmHg) 60 mmHg  Ultrafiltration Rate (mL/min) 1480 mL/min  Dialysate Flow Rate (mL/min) 800 ml/min  Conductivity: Machine  14  HD Safety Checks Performed Yes  Intra-Hemodialysis Comments Tx completed;Tolerated well

## 2017-08-08 NOTE — Progress Notes (Signed)
[facility]  ROUNDING NOTE   Subjective:  Patient came in through the ED requesting dialysis treatment. Missed yesterday. Complains of mild bowel distension and GERD symtpoms.    Objective:  Vital signs in last 24 hours:  Temp:  [98.7 F (37.1 C)-99 F (37.2 C)] (P) 98.7 F (37.1 C) (05/28 1115) Pulse Rate:  [84] 84 (05/28 1011) Resp:  [20] 20 (05/28 1011) BP: (130)/(73) 130/73 (05/28 1011) SpO2:  [99 %-100 %] (P) 100 % (05/28 1115)  Weight change:  There were no vitals filed for this visit.  Intake/Output: No intake/output data recorded.   Intake/Output this shift:  No intake/output data recorded.  Physical Exam: General: NAD  Head: Normocephalic, atraumatic. Moist oral mucosal membranes  Eyes: Anicteric  Neck: Supple  Lungs:  normal effort, mild bilateral rales  Heart: Regular rate and rhythm  Abdomen:  Soft, nontender, distended  Extremities: 1+ peripheral edema.  Neurologic: Nonfocal, moving all four extremities  Skin: No lesions  Access: R IJ permcath    Basic Metabolic Panel: Recent Labs  Lab 08/04/17 1425  NA 137  K 6.4*  CL 101  CO2 28  GLUCOSE 91  BUN 79*  CREATININE 8.56*  CALCIUM 8.9  PHOS 3.8    Liver Function Tests: Recent Labs  Lab 08/04/17 1425  ALBUMIN 2.8*   No results for input(s): LIPASE, AMYLASE in the last 168 hours. No results for input(s): AMMONIA in the last 168 hours.  CBC: Recent Labs  Lab 08/04/17 1425  WBC 7.4  HGB 8.3*  HCT 27.9*  MCV 82.8  PLT 351    Cardiac Enzymes: No results for input(s): CKTOTAL, CKMB, CKMBINDEX, TROPONINI in the last 168 hours.  BNP: Invalid input(s): POCBNP  CBG: No results for input(s): GLUCAP in the last 168 hours.  Microbiology: Results for orders placed or performed during the hospital encounter of 07/12/17  Culture, blood (Routine X 2) w Reflex to ID Panel     Status: None   Collection Time: 07/12/17  4:43 PM  Result Value Ref Range Status   Specimen  Description BLOOD LINE CENTRAL  Final   Special Requests   Final    BOTTLES DRAWN AEROBIC AND ANAEROBIC Blood Culture adequate volume   Culture   Final    NO GROWTH 5 DAYS Performed at [facility], [address] [place], [place] [address]    Report Status 07/17/2017 FINAL  Final    Coagulation Studies: No results for input(s): LABPROT, INR in the last 72 hours.  Urinalysis: No results for input(s): COLORURINE, LABSPEC, PHURINE, GLUCOSEU, HGBUR, BILIRUBINUR, KETONESUR, PROTEINUR, UROBILINOGEN, NITRITE, LEUKOCYTESUR in the last 72 hours.  Invalid input(s): APPERANCEUR    Imaging: No results found.   Medications:       Assessment/ Plan:  [name] is a 57 y.o. black male withend stage renal disease on hemodialysis secondary to Alport's syndrome, ascites, hypertension, anemia of chronic kidney disease, coronary artery disease, peripheral vascular disease, hyperlipidemia, gastrointestinal AVMs, pulmonary hypertension  nooutpatientdialysis unit currently  1.  Hyperkalemia We have ordered repeat K today, last K was 6.4, will place on 1K bath for now.   2.  End-stage renal disease: MWF schedule.  - Missed HD yesterday, receiving dialysis today, still has no outpt unit to go to.   3. Anemia with chronic kidney disease: Hgb currently 8.3, will administer epogen 10000 units IV today.   4. Dialysis device complication: Continue to use R IJ permcath.   5. Hypertension: receives  PRN labetalol during treatments.   6. Ascites: - Will likely need paracentesis again soon.  Pt comes through ED for this.    LOS: 0 Stanley [name] 5/28/201912:28 PM

## 2017-08-08 NOTE — ED Provider Notes (Addendum)
[facility] Emergency Department Provider Note  ____________________________________________  Time seen: Approximately 10:41 AM  I have reviewed the triage vital signs and the nursing notes.   HISTORY  Chief Complaint Vascular Access Problem    HPI Stanley [name] is a 57 y.o. male who comes to the ED today for routine dialysis. He has no acute complaints. He's been compliant with his dialysis without difficulty completing his sessions. Comes to the ED due to inability to maintain an arrangement with outpatient dialysis center. No chest pain or shortness of breath.      Past Medical History:  Diagnosis Date  . Alcohol abuse   . CHF (congestive heart failure) ([place])   . Cirrhosis ([place])   . Coronary artery disease 2009  . Drug abuse ([place])   . End stage renal disease on dialysis [facility]) NEPHROLOGIST-   DR [facility]  IN [place]   HEMODIALYSIS --   TUES/  THURS/  SAT  . Gastrointestinal bleed 06/13/2017   From chart...hx of multiple GI bleeds  . GERD (gastroesophageal reflux disease)   . Hyperlipidemia   . Hypertension   . PAD (peripheral artery disease) ([place])   . Renal insufficiency    Per pt, 32 oz fluid restriction per day  . S/P triple vessel bypass 06/09/2016   2009ish  . Suicidal ideation    & HOMICIDAL IDEATION --  06-16-2013   ADMITTED TO BEHAVIOR HEALTH     Patient Active Problem List   Diagnosis Date Noted  . ESRD (end stage renal disease) on dialysis ([place]) 07/28/2017  . Protein-calorie malnutrition, severe 06/14/2017  . Encounter for dialysis [facility])   . Palliative care by specialist   . Goals of care, counseling/discussion   . Malnutrition of moderate degree 06/05/2017  . Secondary esophageal varices without bleeding ([place])   . Stomach irritation   . Idiopathic esophageal varices without bleeding ([place])   . Alcoholic hepatitis with ascites 05/24/2017  . ESRD (end stage renal disease) ([place]) 04/28/2017  . Uremia 03/08/2017  . ESRD on  hemodialysis ([place]) 03/03/2017  . Weakness 02/28/2017  . Hypocalcemia 02/22/2017  . Shortness of breath 11/26/2016  . COPD (chronic obstructive pulmonary disease) ([place]) 10/30/2016  . COPD exacerbation ([place]) 10/29/2016  . Anemia   . Heme positive stool   . Ulceration of intestine   . Benign neoplasm of transverse colon   . Acute gastrointestinal hemorrhage   . Esophageal candidiasis ([place])   . Angiodysplasia of intestinal tract   . Acute respiratory failure with hypoxia ([place]) 07/03/2016  . GI bleeding 06/24/2016  . Rectal bleeding 06/14/2016  . Anemia of chronic disease 06/01/2016  . MRSA carrier 06/01/2016  . Chronic renal failure 05/23/2016  . Ischemic heart disease 05/23/2016  . Angiodysplasia of small intestine   . Melena   . Small bowel bleed not requiring more than 4 units of blood in 24 hours, ICU, or surgery   . Anemia due to chronic blood loss   . Abdominal pain 05/05/2016  . Acute posthemorrhagic anemia 04/17/2016  . Gastrointestinal bleed 04/17/2016  . History of esophagogastroduodenoscopy (EGD) 04/17/2016  . Elevated troponin 04/17/2016  . Alcohol abuse 04/17/2016  . Upper GI bleed 01/19/2016  . Blood in stool   . Angiodysplasia of stomach and duodenum with hemorrhage   . Gastritis   . Reflux esophagitis   . GI bleed 05/16/2015  . Acute GI bleeding   . Symptomatic anemia 04/30/2015  . HTN (hypertension) 04/06/2015  . GERD (gastroesophageal reflux disease)  04/06/2015  . HLD (hyperlipidemia) 04/06/2015  . Dyspnea 04/06/2015  . Cirrhosis of liver with ascites ([place]) 04/06/2015  . Ascites 04/06/2015  . GIB (gastrointestinal bleeding) 03/23/2015  . Homicidal ideation 06/19/2013  . Suicidal intent 06/19/2013  . Homicidal ideations 06/19/2013  . Hyperkalemia 06/16/2013  . Mandible fracture ([place]) 06/05/2013  . Fracture, mandible ([place]) 06/02/2013  . Coronary atherosclerosis of native coronary artery 06/02/2013  . ESRD on dialysis ([place]) 06/02/2013  . Mandible open  fracture ([place]) 06/02/2013     Past Surgical History:  Procedure Laterality Date  . A/V FISTULAGRAM Right 06/06/2017   Procedure: A/V FISTULAGRAM;  Surgeon: Katha Cabal, MD;  Location: [place] CV LAB;  Service: Cardiovascular;  Laterality: Right;  . A/V SHUNT INTERVENTION N/A 06/06/2017   Procedure: A/V SHUNT INTERVENTION;  Surgeon: Katha Cabal, MD;  Location: [place] CV LAB;  Service: Cardiovascular;  Laterality: N/A;  . AGILE CAPSULE N/A 06/19/2016   Procedure: AGILE CAPSULE;  Surgeon: [name], MD;  Location: [facility] ENDOSCOPY;  Service: Endoscopy;  Laterality: N/A;  . COLONOSCOPY WITH PROPOFOL N/A 06/18/2016   Procedure: COLONOSCOPY WITH PROPOFOL;  Surgeon: [name], MD;  Location: [facility] ENDOSCOPY;  Service: Endoscopy;  Laterality: N/A;  . COLONOSCOPY WITH PROPOFOL N/A 08/12/2016   Procedure: COLONOSCOPY WITH PROPOFOL;  Surgeon: [name], MD;  Location: [facility] ENDOSCOPY;  Service: Endoscopy;  Laterality: N/A;  . COLONOSCOPY WITH PROPOFOL N/A 05/05/2017   Procedure: COLONOSCOPY WITH PROPOFOL;  Surgeon: Manya Silvas, MD;  Location: [facility] ENDOSCOPY;  Service: Endoscopy;  Laterality: N/A;  . CORONARY ANGIOPLASTY  ?   PT UNABLE TO TELL IF  BEFORE OR AFTER  CABG  . CORONARY ARTERY BYPASS GRAFT  2008  ([place] , [place])   3 VESSEL  . DIALYSIS FISTULA CREATION  LAST SURGERY  APPOX  2008  . ENTEROSCOPY N/A 05/10/2016   Procedure: ENTEROSCOPY;  Surgeon: Jerene Bears, MD;  Location: [place];  Service: Gastroenterology;  Laterality: N/A;  . ENTEROSCOPY N/A 08/12/2016   Procedure: ENTEROSCOPY;  Surgeon: [name], MD;  Location: [facility] ENDOSCOPY;  Service: Endoscopy;  Laterality: N/A;  . ENTEROSCOPY Left 06/03/2017   Procedure: ENTEROSCOPY;  Surgeon: Virgel Manifold, MD;  Location: [facility] ENDOSCOPY;  Service: Endoscopy;  Laterality: Left;  Procedure date will ultimately depend on when patient is medically optimized before the procedure, pending hemodialysis and blood  transfusions etc. Will place on schedule and change depending on clinical status.   . ENTEROSCOPY N/A 06/05/2017   Procedure: ENTEROSCOPY;  Surgeon: Virgel Manifold, MD;  Location: [facility] ENDOSCOPY;  Service: Endoscopy;  Laterality: N/A;  . ENTEROSCOPY N/A 06/15/2017   Procedure: Push ENTEROSCOPY;  Surgeon: [name], MD;  Location: [facility] ENDOSCOPY;  Service: Endoscopy;  Laterality: N/A;  . ESOPHAGOGASTRODUODENOSCOPY N/A 05/07/2015   Procedure: ESOPHAGOGASTRODUODENOSCOPY (EGD);  Surgeon: Hulen Luster, MD;  Location: [facility] ENDOSCOPY;  Service: Endoscopy;  Laterality: N/A;  . ESOPHAGOGASTRODUODENOSCOPY (EGD) WITH PROPOFOL N/A 05/17/2015   Procedure: ESOPHAGOGASTRODUODENOSCOPY (EGD) WITH PROPOFOL;  Surgeon: [name], MD;  Location: [facility] ENDOSCOPY;  Service: Endoscopy;  Laterality: N/A;  . ESOPHAGOGASTRODUODENOSCOPY (EGD) WITH PROPOFOL N/A 01/20/2016   Procedure: ESOPHAGOGASTRODUODENOSCOPY (EGD) WITH PROPOFOL;  Surgeon: [name], MD;  Location: [facility] ENDOSCOPY;  Service: Endoscopy;  Laterality: N/A;  . ESOPHAGOGASTRODUODENOSCOPY (EGD) WITH PROPOFOL N/A 04/17/2016   Procedure: ESOPHAGOGASTRODUODENOSCOPY (EGD) WITH PROPOFOL;  Surgeon: Lin Landsman, MD;  Location: [facility] ENDOSCOPY;  Service: Gastroenterology;  Laterality: N/A;  . ESOPHAGOGASTRODUODENOSCOPY (EGD) WITH PROPOFOL  05/09/2016   Procedure: ESOPHAGOGASTRODUODENOSCOPY (EGD)  WITH PROPOFOL;  Surgeon: Jerene Bears, MD;  Location: [place];  Service: Endoscopy;;  . ESOPHAGOGASTRODUODENOSCOPY (EGD) WITH PROPOFOL N/A 06/16/2016   Procedure: ESOPHAGOGASTRODUODENOSCOPY (EGD) WITH PROPOFOL;  Surgeon: [name], MD;  Location: [facility] ENDOSCOPY;  Service: Endoscopy;  Laterality: N/A;  . ESOPHAGOGASTRODUODENOSCOPY (EGD) WITH PROPOFOL N/A 05/05/2017   Procedure: ESOPHAGOGASTRODUODENOSCOPY (EGD) WITH PROPOFOL;  Surgeon: Manya Silvas, MD;  [facility];  Service: Endoscopy;  Laterality: N/A;  . ESOPHAGOGASTRODUODENOSCOPY (EGD) WITH PROPOFOL  N/A 06/15/2017   Procedure: ESOPHAGOGASTRODUODENOSCOPY (EGD) WITH PROPOFOL;  Surgeon: [name], MD;  Location: [facility] ENDOSCOPY;  Service: Endoscopy;  Laterality: N/A;  . GIVENS CAPSULE STUDY N/A 05/07/2016   Procedure: GIVENS CAPSULE STUDY;  Surgeon: Doran Stabler, MD;  Location: [place];  Service: Endoscopy;  Laterality: N/A;  . MANDIBULAR HARDWARE REMOVAL N/A 07/29/2013   Procedure: REMOVAL OF ARCH BARS;  Surgeon: Theodoro Kos, DO;  Location: [place];  Service: Plastics;  Laterality: N/A;  . ORIF MANDIBULAR FRACTURE N/A 06/05/2013   Procedure: REPAIR OF MANDIBULAR FRACTURE x 2 with maxillo-mandibular fixation ;  Surgeon: Theodoro Kos, DO;  Location: [place];  Service: Plastics;  Laterality: N/A;  . PERIPHERAL ARTERIAL STENT GRAFT Left      Prior to Admission medications   Medication Sig Start Date End Date Taking? Authorizing Provider  albuterol (PROVENTIL HFA;VENTOLIN HFA) 108 (90 Base) MCG/ACT inhaler Inhale 2 puffs into the lungs every 6 (six) hours as needed for wheezing or shortness of breath. 04/21/17   Lisa Roca, MD  alum & mag hydroxide-simeth (MAALOX/MYLANTA) 200-200-20 MG/5ML suspension Take 15 mLs every 4 (four) hours as needed by mouth for indigestion or heartburn. Patient not taking: Reported on 04/19/2017 01/25/17   Demetrios Loll, MD  atorvastatin (LIPITOR) 40 MG tablet Take 1 tablet (40 mg total) by mouth daily. 11/16/16   Vaughan Basta, MD  budesonide-formoterol (SYMBICORT) 160-4.5 MCG/ACT inhaler Inhale 2 puffs into the lungs daily. 11/16/16   Vaughan Basta, MD  calcium acetate (PHOSLO) 667 MG capsule Take 2,001 mg by mouth 3 (three) times daily.    [provider]  doxycycline (VIBRA-TABS) 100 MG tablet Take 1 tablet (100 mg total) by mouth 2 (two) times daily. 07/10/17   Harvest Dark, MD  ferrous sulfate 325 (65 FE) MG tablet Take 1 tablet by mouth 3 (three) times daily. 01/21/17   [provider]  furosemide  (LASIX) 80 MG tablet Take 1 tablet (80 mg total) daily by mouth. 01/25/17   Demetrios Loll, MD  gabapentin (NEURONTIN) 300 MG capsule Take 1 capsule by mouth daily as directed 08/16/16   [provider]  ipratropium-albuterol (DUONEB) 0.5-2.5 (3) MG/3ML SOLN Take 3 mLs by nebulization every 6 (six) hours as needed. 11/28/16   Loletha Grayer, MD  labetalol (NORMODYNE) 100 MG tablet Take 100 mg by mouth daily. 03/24/17   [provider]  Multiple Vitamins-Minerals-FA (DIALYVITE SUPREME D) 3 MG TABS Take 1 tablet (3 mg total) by mouth daily. 11/28/16   Loletha Grayer, MD  nitroGLYCERIN (NITROSTAT) 0.4 MG SL tablet Place 1 tablet (0.4 mg total) under the tongue every 5 (five) minutes as needed. 04/12/16   Wende Bushy, MD  pantoprazole (PROTONIX) 40 MG tablet Take 1 tablet (40 mg total) by mouth daily. 11/28/16   Loletha Grayer, MD  Spacer/Aero Chamber Mouthpiece MISC 1 Units by Does not apply route every 4 (four) hours as needed (wheezing). 02/19/17   Darel Hong, MD  tiotropium (SPIRIVA HANDIHALER) 18 MCG inhalation capsule Place  1 capsule (18 mcg total) into inhaler and inhale daily. 11/16/16 11/16/17  Vaughan Basta, MD     Allergies Patient has no known allergies.   Family History  Problem Relation Age of Onset  . Colon cancer Mother   . Cancer Father   . Cancer Sister   . Kidney disease Brother     Social History Social History   Tobacco Use  . Smoking status: Current Every Day Smoker    Packs/day: 0.15    Years: 40.00    Pack years: 6.00    Types: Cigarettes  . Smokeless tobacco: Never Used  Substance Use Topics  . Alcohol use: No    Comment: pt reports quitting after learning about cirrhosis  . Drug use: No    Frequency: 7.0 times per week    Types: Marijuana, Cocaine    Review of Systems  Constitutional:   No fever or chills.   Cardiovascular:   No chest pain or syncope. Respiratory:   No dyspnea or cough. All other systems reviewed and are  negative except as documented above in ROS and HPI.  ____________________________________________   PHYSICAL EXAM:  VITAL SIGNS: ED Triage Vitals [08/08/17 1011]  Enc Vitals Group     BP 130/73     Pulse Rate 84     Resp 20     Temp 99 F (37.2 C)     Temp Source Oral     SpO2 99 %     Weight      Height      Head Circumference      Peak Flow      Pain Score 0     Pain Loc      Pain Edu?      Excl. in [place]?     Vital signs reviewed, nursing assessments reviewed.   Constitutional:   Alert and oriented. Well appearing and in no distress. Eyes:   Conjunctivae are normal. EOMI. ENT      Head:   Normocephalic and atraumatic.      Nose:   No congestion/rhinnorhea. no epistaxis      Mouth/Throat:   MMM  Cardiovascular:   RRR.  Respiratory:   Normal respiratory effort . Breath sounds are clear and equal bilaterally. Musculoskeletal:   Normal range of motion in all extremities. No joint effusions.  No lower extremity tenderness.  No edema. Neurologic:   Normal speech and language.  Motor grossly intact. ambulatory with steady gait No acute focal neurologic deficits are appreciated.  ____________________________________________    LABS (pertinent positives/negatives) (all labs ordered are listed, but only abnormal results are displayed) Labs Reviewed - No data to display ____________________________________________   EKG    ____________________________________________    RADIOLOGY  No results found.  ____________________________________________   PROCEDURES Procedures  ____________________________________________    CLINICAL IMPRESSION / ASSESSMENT AND PLAN / ED COURSE  Pertinent labs & imaging results that were available during my care of the patient were reviewed by me and considered in my medical decision making (see chart for details).    patient well appearing, normal vital signs, no acute complaints, at baseline state of health, presents for  routine dialysis because he has no other available dialysis resources. We'll plan to have dialysis performed today as previously arranged by the Hospital. Anticipated discharge afterward.       ----------------------------------------- 3:19 PM on 08/08/2017 -----------------------------------------  Patient returned from dialysis. Ambulatory, no complaints. Left before the printed discharge instructions could be handed to him.  ____________________________________________   FINAL CLINICAL IMPRESSION(S) / ED DIAGNOSES    Final diagnoses:  [name] [facility])     ED Discharge Orders    None      Portions of this note were generated with dragon dictation software. Dictation errors may occur despite best attempts at proofreading.    Carrie Mew, MD 08/08/17 [place]    Carrie Mew, MD 08/08/17 [phone]

## 2017-08-08 NOTE — ED Triage Notes (Signed)
Patient here for dialysis.  Patient is receiving his dialysis treatments via the ED.

## 2017-08-08 NOTE — Progress Notes (Signed)
Post HD Tx    08/08/17 1445  Vital Signs  Temp 98.7 F (37.1 C)  Temp Source Oral  Pulse Rate 88  Pulse Rate Source Monitor  Resp 16  BP 131/79  BP Location Left Arm  BP Method Automatic  Patient Position (if appropriate) Sitting  Oxygen Therapy  SpO2 100 %  O2 Device Room Air  Post-Hemodialysis Assessment  Rinseback Volume (mL) 250 mL  KECN 54.6 V  Dialyzer Clearance Lightly streaked  Duration of HD Treatment -hour(s) 3.5 hour(s)  Hemodialysis Intake (mL) 750 mL  UF Total -Machine (mL) 3863 mL  Net UF (mL) 3113 mL  Tolerated HD Treatment Yes  Hemodialysis Catheter Right Internal jugular Double-lumen  No Placement Date or Time found.   Placed prior to admission: Yes  Orientation: Right  Access Location: Internal jugular  Hemodialysis Catheter Type: Double-lumen  Site Condition No complications  Blue Lumen Status Heparin locked  Red Lumen Status Heparin locked  Purple Lumen Status N/A  Catheter fill solution Heparin 1000 units/ml  Catheter fill volume (Arterial) 1.5 cc  Catheter fill volume (Venous) 1.5  Dressing Type Biopatch;Occlusive  Dressing Status Clean;Dry;Intact  Drainage Description None  Post treatment catheter status Capped and Clamped

## 2017-08-08 NOTE — Progress Notes (Signed)
Pre HD Tx   08/08/17 1115  Vital Signs  Temp 98.7 F (37.1 C)  Temp Source Oral  Pulse Rate Source Monitor  Resp 19  BP 128/67  BP Location Left Arm  BP Method Automatic  Patient Position (if appropriate) Sitting  Oxygen Therapy  SpO2 100 %  O2 Device Room Air  Pain Assessment  Pain Scale 0-10  Pain Score 0  Dialysis Weight  Weight 77.1 kg (169 lb 15.6 oz)  Type of Weight Post-Dialysis  Time-Out for Hemodialysis  What Procedure? HD   Pt Identifiers(min of two) First/Last Name;MRN/Account#  Correct Site? Yes  Correct Side? Yes  Correct Procedure? Yes  Consents Verified? Yes  Rad Studies Available? N/A  Safety Precautions Reviewed? Yes  Engineer, civil (consulting) Number [id number]  Station Number 3  UF/Alarm Test Passed  Conductivity: Meter 13.8  Conductivity: Machine  13.8  pH 7.4  Reverse Osmosis Main  Normal Saline Lot Number [id number]  Dialyzer Lot Number [id number]  Disposable Set Lot Number [id number]  Machine Temperature 98.6 F (37 C)  Musician and Audible Yes  Blood Lines Intact and Secured Yes  Pre Treatment Patient Checks  Vascular access used during treatment Catheter  Patient is receiving dialysis in a chair Yes  Hepatitis B Surface Antigen Results Negative  Hepatitis B Surface Antibody  (>10)  Date Hepatitis B Surface Antibody Drawn 03/01/17  Hemodialysis Consent Verified Yes  Hemodialysis Standing Orders Initiated Yes  ECG (Telemetry) Monitor On Yes  Prime Ordered Normal Saline  Length of  DialysisTreatment -hour(s) 3.5 Hour(s)  Dialysis Treatment Comments  (Na 140)  Dialyzer Elisio 17H NR  Dialysate 2K, 2.5 Ca  Dialysis Anticoagulant None  Dialysate Flow Ordered 800  Blood Flow Rate Ordered 400 mL/min  Ultrafiltration Goal 2.5 Liters  Pre Treatment Labs Renal panel;Phosphorus;CBC  Dialysis Blood Pressure Support Ordered Normal Saline  Education / Care Plan  Dialysis Education Provided Yes  Documented Education in Care Plan Yes   Hemodialysis Catheter Right Internal jugular Double-lumen  No Placement Date or Time found.   Placed prior to admission: Yes  Orientation: Right  Access Location: Internal jugular  Hemodialysis Catheter Type: Double-lumen  Site Condition No complications  Blue Lumen Status Flushed  Red Lumen Status Flushed  Purple Lumen Status N/A  Dressing Type Biopatch;Occlusive  Dressing Status Clean;Dry;Intact  Drainage Description None

## 2017-08-08 NOTE — Progress Notes (Signed)
Pt system clotted, pt stable rinsed back and 2nd set up initiated. MD aware. Stanley [name]

## 2017-08-10 ENCOUNTER — Other Ambulatory Visit: Payer: Self-pay

## 2017-08-10 ENCOUNTER — Non-Acute Institutional Stay
Admission: EM | Admit: 2017-08-10 | Discharge: 2017-08-13 | Disposition: A | Discharge status: Home / Self Care | Payer: Medicare Other | Attending: Emergency Medicine | Admitting: Emergency Medicine

## 2017-08-10 ENCOUNTER — Emergency Department: Payer: Medicare Other

## 2017-08-10 DIAGNOSIS — Z9119 Patient's noncompliance with other medical treatment and regimen: Secondary | ICD-10-CM | POA: Diagnosis not present

## 2017-08-10 DIAGNOSIS — I509 Heart failure, unspecified: Secondary | ICD-10-CM | POA: Diagnosis not present

## 2017-08-10 DIAGNOSIS — Z992 Dependence on renal dialysis: Secondary | ICD-10-CM | POA: Diagnosis not present

## 2017-08-10 DIAGNOSIS — K7460 Unspecified cirrhosis of liver: Secondary | ICD-10-CM | POA: Insufficient documentation

## 2017-08-10 DIAGNOSIS — K210 Gastro-esophageal reflux disease with esophagitis: Secondary | ICD-10-CM | POA: Diagnosis not present

## 2017-08-10 DIAGNOSIS — Z809 Family history of malignant neoplasm, unspecified: Secondary | ICD-10-CM | POA: Insufficient documentation

## 2017-08-10 DIAGNOSIS — Z800 Family history of malignant neoplasm of digestive organs: Secondary | ICD-10-CM | POA: Insufficient documentation

## 2017-08-10 DIAGNOSIS — K7011 Alcoholic hepatitis with ascites: Secondary | ICD-10-CM | POA: Diagnosis not present

## 2017-08-10 DIAGNOSIS — Z79899 Other long term (current) drug therapy: Secondary | ICD-10-CM | POA: Insufficient documentation

## 2017-08-10 DIAGNOSIS — D631 Anemia in chronic kidney disease: Secondary | ICD-10-CM | POA: Insufficient documentation

## 2017-08-10 DIAGNOSIS — Z841 Family history of disorders of kidney and ureter: Secondary | ICD-10-CM | POA: Insufficient documentation

## 2017-08-10 DIAGNOSIS — I8510 Secondary esophageal varices without bleeding: Secondary | ICD-10-CM | POA: Insufficient documentation

## 2017-08-10 DIAGNOSIS — E785 Hyperlipidemia, unspecified: Secondary | ICD-10-CM | POA: Insufficient documentation

## 2017-08-10 DIAGNOSIS — J449 Chronic obstructive pulmonary disease, unspecified: Secondary | ICD-10-CM | POA: Diagnosis not present

## 2017-08-10 DIAGNOSIS — Z7951 Long term (current) use of inhaled steroids: Secondary | ICD-10-CM | POA: Insufficient documentation

## 2017-08-10 DIAGNOSIS — I272 Pulmonary hypertension, unspecified: Secondary | ICD-10-CM | POA: Insufficient documentation

## 2017-08-10 DIAGNOSIS — D123 Benign neoplasm of transverse colon: Secondary | ICD-10-CM | POA: Insufficient documentation

## 2017-08-10 DIAGNOSIS — I2510 Atherosclerotic heart disease of native coronary artery without angina pectoris: Secondary | ICD-10-CM | POA: Diagnosis not present

## 2017-08-10 DIAGNOSIS — K31811 Angiodysplasia of stomach and duodenum with bleeding: Secondary | ICD-10-CM | POA: Insufficient documentation

## 2017-08-10 DIAGNOSIS — R188 Other ascites: Secondary | ICD-10-CM

## 2017-08-10 DIAGNOSIS — I739 Peripheral vascular disease, unspecified: Secondary | ICD-10-CM | POA: Diagnosis not present

## 2017-08-10 DIAGNOSIS — F17210 Nicotine dependence, cigarettes, uncomplicated: Secondary | ICD-10-CM | POA: Insufficient documentation

## 2017-08-10 DIAGNOSIS — N186 End stage renal disease: Secondary | ICD-10-CM | POA: Insufficient documentation

## 2017-08-10 DIAGNOSIS — E875 Hyperkalemia: Secondary | ICD-10-CM | POA: Diagnosis not present

## 2017-08-10 DIAGNOSIS — I10 Essential (primary) hypertension: Secondary | ICD-10-CM | POA: Diagnosis not present

## 2017-08-10 DIAGNOSIS — E43 Unspecified severe protein-calorie malnutrition: Secondary | ICD-10-CM | POA: Diagnosis not present

## 2017-08-10 DIAGNOSIS — J9601 Acute respiratory failure with hypoxia: Secondary | ICD-10-CM | POA: Diagnosis not present

## 2017-08-10 DIAGNOSIS — K2970 Gastritis, unspecified, without bleeding: Secondary | ICD-10-CM | POA: Insufficient documentation

## 2017-08-10 DIAGNOSIS — I132 Hypertensive heart and chronic kidney disease with heart failure and with stage 5 chronic kidney disease, or end stage renal disease: Secondary | ICD-10-CM | POA: Diagnosis not present

## 2017-08-10 DIAGNOSIS — Z951 Presence of aortocoronary bypass graft: Secondary | ICD-10-CM | POA: Insufficient documentation

## 2017-08-10 LAB — PHOSPHORUS: PHOSPHORUS: 3.2 mg/dL (ref 2.5–4.6)

## 2017-08-10 LAB — POTASSIUM: POTASSIUM: 7.4 mmol/L — AB (ref 3.5–5.1)

## 2017-08-10 MED ORDER — EPOETIN ALFA 10000 UNIT/ML IJ SOLN
10000.0000 [IU] | Freq: Once | INTRAMUSCULAR | Status: AC
  Administered 2017-08-10: 10000 [IU] via INTRAVENOUS

## 2017-08-10 MED ORDER — ALBUMIN HUMAN 25 % IV SOLN
25.0000 g | Freq: Once | INTRAVENOUS | Status: DC
  Filled 2017-08-10 (×3): qty 100

## 2017-08-10 MED ORDER — DIPHENHYDRAMINE HCL 50 MG/ML IJ SOLN
25.0000 mg | Freq: Once | INTRAMUSCULAR | Status: AC
  Administered 2017-08-10: 25 mg via INTRAVENOUS

## 2017-08-10 NOTE — ED Provider Notes (Signed)
[facility] Emergency Department Provider Note  ____________________________________________  Time seen: Approximately 10:02 AM  I have reviewed the triage vital signs and the nursing notes.   HISTORY  Chief Complaint here for dialysis    HPI Stanley [name] is a 57 y.o. male with a history of CAD, cirrhosis, CHF, hypertension, end-stage renal disease on dialysis, and adequate noncompliance who returns to the ED today for routine dialysis. He reports no acute complaints. No fevers or belly pain. He feels totally fine and just wants his dialysis. His nephrologist is also ordered a paracentesis for him due to continued accumulation of his ascites.      Past Medical History:  Diagnosis Date  . Alcohol abuse   . CHF (congestive heart failure) ([place])   . Cirrhosis ([place])   . Coronary artery disease 2009  . Drug abuse ([place])   . End stage renal disease on dialysis [facility]) NEPHROLOGIST-   DR [facility]  IN [place]   HEMODIALYSIS --   TUES/  THURS/  SAT  . Gastrointestinal bleed 06/13/2017   From chart...hx of multiple GI bleeds  . GERD (gastroesophageal reflux disease)   . Hyperlipidemia   . Hypertension   . PAD (peripheral artery disease) ([place])   . Renal insufficiency    Per pt, 32 oz fluid restriction per day  . S/P triple vessel bypass 06/09/2016   2009ish  . Suicidal ideation    & HOMICIDAL IDEATION --  06-16-2013   ADMITTED TO BEHAVIOR HEALTH     Patient Active Problem List   Diagnosis Date Noted  . ESRD (end stage renal disease) on dialysis ([place]) 07/28/2017  . Protein-calorie malnutrition, severe 06/14/2017  . Encounter for dialysis [facility])   . Palliative care by specialist   . Goals of care, counseling/discussion   . Malnutrition of moderate degree 06/05/2017  . Secondary esophageal varices without bleeding ([place])   . Stomach irritation   . Idiopathic esophageal varices without bleeding ([place])   . Alcoholic hepatitis with ascites 05/24/2017  .  ESRD (end stage renal disease) ([place]) 04/28/2017  . Uremia 03/08/2017  . ESRD on hemodialysis ([place]) 03/03/2017  . Weakness 02/28/2017  . Hypocalcemia 02/22/2017  . Shortness of breath 11/26/2016  . COPD (chronic obstructive pulmonary disease) ([place]) 10/30/2016  . COPD exacerbation ([place]) 10/29/2016  . Anemia   . Heme positive stool   . Ulceration of intestine   . Benign neoplasm of transverse colon   . Acute gastrointestinal hemorrhage   . Esophageal candidiasis ([place])   . Angiodysplasia of intestinal tract   . Acute respiratory failure with hypoxia ([place]) 07/03/2016  . GI bleeding 06/24/2016  . Rectal bleeding 06/14/2016  . Anemia of chronic disease 06/01/2016  . MRSA carrier 06/01/2016  . Chronic renal failure 05/23/2016  . Ischemic heart disease 05/23/2016  . Angiodysplasia of small intestine   . Melena   . Small bowel bleed not requiring more than 4 units of blood in 24 hours, ICU, or surgery   . Anemia due to chronic blood loss   . Abdominal pain 05/05/2016  . Acute posthemorrhagic anemia 04/17/2016  . Gastrointestinal bleed 04/17/2016  . History of esophagogastroduodenoscopy (EGD) 04/17/2016  . Elevated troponin 04/17/2016  . Alcohol abuse 04/17/2016  . Upper GI bleed 01/19/2016  . Blood in stool   . Angiodysplasia of stomach and duodenum with hemorrhage   . Gastritis   . Reflux esophagitis   . GI bleed 05/16/2015  . Acute GI bleeding   . Symptomatic  anemia 04/30/2015  . HTN (hypertension) 04/06/2015  . GERD (gastroesophageal reflux disease) 04/06/2015  . HLD (hyperlipidemia) 04/06/2015  . Dyspnea 04/06/2015  . Cirrhosis of liver with ascites ([place]) 04/06/2015  . Ascites 04/06/2015  . GIB (gastrointestinal bleeding) 03/23/2015  . Homicidal ideation 06/19/2013  . Suicidal intent 06/19/2013  . Homicidal ideations 06/19/2013  . Hyperkalemia 06/16/2013  . Mandible fracture ([place]) 06/05/2013  . Fracture, mandible ([place]) 06/02/2013  . Coronary atherosclerosis of native  coronary artery 06/02/2013  . ESRD on dialysis ([place]) 06/02/2013  . Mandible open fracture ([place]) 06/02/2013     Past Surgical History:  Procedure Laterality Date  . A/V FISTULAGRAM Right 06/06/2017   Procedure: A/V FISTULAGRAM;  Surgeon: Katha Cabal, MD;  Location: [place] CV LAB;  Service: Cardiovascular;  Laterality: Right;  . A/V SHUNT INTERVENTION N/A 06/06/2017   Procedure: A/V SHUNT INTERVENTION;  Surgeon: Katha Cabal, MD;  Location: [place] CV LAB;  Service: Cardiovascular;  Laterality: N/A;  . AGILE CAPSULE N/A 06/19/2016   Procedure: AGILE CAPSULE;  Surgeon: [name], MD;  Location: [facility] ENDOSCOPY;  Service: Endoscopy;  Laterality: N/A;  . COLONOSCOPY WITH PROPOFOL N/A 06/18/2016   Procedure: COLONOSCOPY WITH PROPOFOL;  Surgeon: [name], MD;  Location: [facility] ENDOSCOPY;  Service: Endoscopy;  Laterality: N/A;  . COLONOSCOPY WITH PROPOFOL N/A 08/12/2016   Procedure: COLONOSCOPY WITH PROPOFOL;  Surgeon: [name], MD;  Location: [facility] ENDOSCOPY;  Service: Endoscopy;  Laterality: N/A;  . COLONOSCOPY WITH PROPOFOL N/A 05/05/2017   Procedure: COLONOSCOPY WITH PROPOFOL;  Surgeon: Manya Silvas, MD;  Location: [facility] ENDOSCOPY;  Service: Endoscopy;  Laterality: N/A;  . CORONARY ANGIOPLASTY  ?   PT UNABLE TO TELL IF  BEFORE OR AFTER  CABG  . CORONARY ARTERY BYPASS GRAFT  2008  ([place] , [place])   3 VESSEL  . DIALYSIS FISTULA CREATION  LAST SURGERY  APPOX  2008  . ENTEROSCOPY N/A 05/10/2016   Procedure: ENTEROSCOPY;  Surgeon: Jerene Bears, MD;  Location: [place];  Service: Gastroenterology;  Laterality: N/A;  . ENTEROSCOPY N/A 08/12/2016   Procedure: ENTEROSCOPY;  Surgeon: [name], MD;  Location: [facility] ENDOSCOPY;  Service: Endoscopy;  Laterality: N/A;  . ENTEROSCOPY Left 06/03/2017   Procedure: ENTEROSCOPY;  Surgeon: Virgel Manifold, MD;  Location: [facility] ENDOSCOPY;  Service: Endoscopy;  Laterality: Left;  Procedure date will ultimately depend on when patient  is medically optimized before the procedure, pending hemodialysis and blood transfusions etc. Will place on schedule and change depending on clinical status.   . ENTEROSCOPY N/A 06/05/2017   Procedure: ENTEROSCOPY;  Surgeon: Virgel Manifold, MD;  Location: [facility] ENDOSCOPY;  Service: Endoscopy;  Laterality: N/A;  . ENTEROSCOPY N/A 06/15/2017   Procedure: Push ENTEROSCOPY;  Surgeon: [name], MD;  Location: [facility] ENDOSCOPY;  Service: Endoscopy;  Laterality: N/A;  . ESOPHAGOGASTRODUODENOSCOPY N/A 05/07/2015   Procedure: ESOPHAGOGASTRODUODENOSCOPY (EGD);  Surgeon: Hulen Luster, MD;  Location: [facility] ENDOSCOPY;  Service: Endoscopy;  Laterality: N/A;  . ESOPHAGOGASTRODUODENOSCOPY (EGD) WITH PROPOFOL N/A 05/17/2015   Procedure: ESOPHAGOGASTRODUODENOSCOPY (EGD) WITH PROPOFOL;  Surgeon: [name], MD;  Location: [facility] ENDOSCOPY;  Service: Endoscopy;  Laterality: N/A;  . ESOPHAGOGASTRODUODENOSCOPY (EGD) WITH PROPOFOL N/A 01/20/2016   Procedure: ESOPHAGOGASTRODUODENOSCOPY (EGD) WITH PROPOFOL;  Surgeon: [name], MD;  Location: [facility] ENDOSCOPY;  Service: Endoscopy;  Laterality: N/A;  . ESOPHAGOGASTRODUODENOSCOPY (EGD) WITH PROPOFOL N/A 04/17/2016   Procedure: ESOPHAGOGASTRODUODENOSCOPY (EGD) WITH PROPOFOL;  Surgeon: Lin Landsman, MD;  Location: [facility] ENDOSCOPY;  Service: Gastroenterology;  Laterality: N/A;  .  ESOPHAGOGASTRODUODENOSCOPY (EGD) WITH PROPOFOL  05/09/2016   Procedure: ESOPHAGOGASTRODUODENOSCOPY (EGD) WITH PROPOFOL;  Surgeon: Jerene Bears, MD;  Location: [place];  Service: Endoscopy;;  . ESOPHAGOGASTRODUODENOSCOPY (EGD) WITH PROPOFOL N/A 06/16/2016   Procedure: ESOPHAGOGASTRODUODENOSCOPY (EGD) WITH PROPOFOL;  Surgeon: [name], MD;  Location: [facility] ENDOSCOPY;  Service: Endoscopy;  Laterality: N/A;  . ESOPHAGOGASTRODUODENOSCOPY (EGD) WITH PROPOFOL N/A 05/05/2017   Procedure: ESOPHAGOGASTRODUODENOSCOPY (EGD) WITH PROPOFOL;  Surgeon: Manya Silvas, MD;  Location: [facility] ENDOSCOPY;  Service:  Endoscopy;  Laterality: N/A;  . ESOPHAGOGASTRODUODENOSCOPY (EGD) WITH PROPOFOL N/A 06/15/2017   Procedure: ESOPHAGOGASTRODUODENOSCOPY (EGD) WITH PROPOFOL;  Surgeon: [name], MD;  Location: [facility] ENDOSCOPY;  Service: Endoscopy;  Laterality: N/A;  . GIVENS CAPSULE STUDY N/A 05/07/2016   Procedure: GIVENS CAPSULE STUDY;  Surgeon: Doran Stabler, MD;  Location: [place];  Service: Endoscopy;  Laterality: N/A;  . MANDIBULAR HARDWARE REMOVAL N/A 07/29/2013   Procedure: REMOVAL OF ARCH BARS;  Surgeon: Theodoro Kos, DO;  Location: [place];  Service: Plastics;  Laterality: N/A;  . ORIF MANDIBULAR FRACTURE N/A 06/05/2013   Procedure: REPAIR OF MANDIBULAR FRACTURE x 2 with maxillo-mandibular fixation ;  Surgeon: Theodoro Kos, DO;  Location: [place];  Service: Plastics;  Laterality: N/A;  . PERIPHERAL ARTERIAL STENT GRAFT Left      Prior to Admission medications   Medication Sig Start Date End Date Taking? Authorizing Provider  albuterol (PROVENTIL HFA;VENTOLIN HFA) 108 (90 Base) MCG/ACT inhaler Inhale 2 puffs into the lungs every 6 (six) hours as needed for wheezing or shortness of breath. 04/21/17   Lisa Roca, MD  alum & mag hydroxide-simeth (MAALOX/MYLANTA) 200-200-20 MG/5ML suspension Take 15 mLs every 4 (four) hours as needed by mouth for indigestion or heartburn. Patient not taking: Reported on 04/19/2017 01/25/17   Demetrios Loll, MD  atorvastatin (LIPITOR) 40 MG tablet Take 1 tablet (40 mg total) by mouth daily. 11/16/16   Vaughan Basta, MD  budesonide-formoterol (SYMBICORT) 160-4.5 MCG/ACT inhaler Inhale 2 puffs into the lungs daily. 11/16/16   Vaughan Basta, MD  calcium acetate (PHOSLO) 667 MG capsule Take 2,001 mg by mouth 3 (three) times daily.    [provider]  doxycycline (VIBRA-TABS) 100 MG tablet Take 1 tablet (100 mg total) by mouth 2 (two) times daily. 07/10/17   Harvest Dark, MD  ferrous sulfate 325 (65 FE) MG tablet Take 1 tablet by  mouth 3 (three) times daily. 01/21/17   [provider]  furosemide (LASIX) 80 MG tablet Take 1 tablet (80 mg total) daily by mouth. 01/25/17   Demetrios Loll, MD  gabapentin (NEURONTIN) 300 MG capsule Take 1 capsule by mouth daily as directed 08/16/16   [provider]  ipratropium-albuterol (DUONEB) 0.5-2.5 (3) MG/3ML SOLN Take 3 mLs by nebulization every 6 (six) hours as needed. 11/28/16   Loletha Grayer, MD  labetalol (NORMODYNE) 100 MG tablet Take 100 mg by mouth daily. 03/24/17   [provider]  Multiple Vitamins-Minerals-FA (DIALYVITE SUPREME D) 3 MG TABS Take 1 tablet (3 mg total) by mouth daily. 11/28/16   Loletha Grayer, MD  nitroGLYCERIN (NITROSTAT) 0.4 MG SL tablet Place 1 tablet (0.4 mg total) under the tongue every 5 (five) minutes as needed. 04/12/16   Wende Bushy, MD  pantoprazole (PROTONIX) 40 MG tablet Take 1 tablet (40 mg total) by mouth daily. 11/28/16   Loletha Grayer, MD  Spacer/Aero Chamber Mouthpiece MISC 1 Units by Does not apply route every 4 (four) hours as needed (wheezing). 02/19/17   Rifenbark,  Milta Deiters, MD  tiotropium (SPIRIVA HANDIHALER) 18 MCG inhalation capsule Place 1 capsule (18 mcg total) into inhaler and inhale daily. 11/16/16 11/16/17  Vaughan Basta, MD     Allergies Patient has no known allergies.   Family History  Problem Relation Age of Onset  . Colon cancer Mother   . Cancer Father   . Cancer Sister   . Kidney disease Brother     Social History Social History   Tobacco Use  . Smoking status: Current Every Day Smoker    Packs/day: 0.15    Years: 40.00    Pack years: 6.00    Types: Cigarettes  . Smokeless tobacco: Never Used  Substance Use Topics  . Alcohol use: No    Comment: pt reports quitting after learning about cirrhosis  . Drug use: No    Frequency: 7.0 times per week    Types: Marijuana, Cocaine    Review of Systems  Constitutional:   No fever or chills.  ENT:   No sore throat. No  rhinorrhea. Cardiovascular:   No chest pain or syncope. Respiratory:   No dyspnea or cough. Gastrointestinal:   Negative for abdominal pain, vomiting and diarrhea.  Musculoskeletal:   Negative for focal pain or swelling All other systems reviewed and are negative except as documented above in ROS and HPI.  ____________________________________________   PHYSICAL EXAM:  VITAL SIGNS: ED Triage Vitals  Enc Vitals Group     BP 08/10/17 0925 123/70     Pulse Rate 08/10/17 0925 82     Resp 08/10/17 0925 17     Temp 08/10/17 0925 98.1 F (36.7 C)     Temp Source 08/10/17 0925 Oral     SpO2 08/10/17 0925 100 %     Weight --      Height --      Head Circumference --      Peak Flow --      Pain Score 08/10/17 0930 0     Pain Loc --      Pain Edu? --      Excl. in [place]? --     Vital signs reviewed, nursing assessments reviewed.   Constitutional:   Alert and oriented. Well appearing and in no distress. Eyes:   Conjunctivae are normal. EOMI. ENT      Head:   Normocephalic and atraumatic.      Nose:   No congestion/rhinnorhea.       Mouth/Throat:   MMM      Neck:   No meningismus. Full ROM. Hematological/Lymphatic/Immunilogical:   No cervical lymphadenopathy. Cardiovascular:   RRR. Respiratory:   Normal respiratory effort without tachypnea/retractions. Breath sounds are clear and equal bilaterally.  Gastrointestinal:   Soft and nontender. significantly distended with ascites. There is no CVA tenderness.  No rebound, rigidity, or guarding.  Musculoskeletal:   Normal range of motion in all extremities. No joint effusions.  No lower extremity tenderness.  No edema. Neurologic:   Normal speech and language.  Motor grossly intact. No acute focal neurologic deficits are appreciated.  Skin:    Skin is warm, dry and intact. No rash noted.  No petechiae, purpura, or bullae.  ____________________________________________    LABS (pertinent positives/negatives) (all labs ordered are  listed, but only abnormal results are displayed) Labs Reviewed - No data to display ____________________________________________   EKG    ____________________________________________    RADIOLOGY  No results found.  ____________________________________________   PROCEDURES Procedures  ____________________________________________    CLINICAL IMPRESSION /  ASSESSMENT AND PLAN / ED COURSE  Pertinent labs & imaging results that were available during my care of the patient were reviewed by me and considered in my medical decision making (see chart for details).    vital signs normal, exam unremarkable, no acute complaints. Here for routine dialysis and paracentesis which he is unable to obtain due to noncompliance and not having any facilities that are willing to provide this service for him on an ongoing basis at this point. Stable to proceed with his procedures. We have discussed with dialysis unit and ultrasound to coordinate. Nephrology has ordered albumin to be given after paracentesis.      ____________________________________________   FINAL CLINICAL IMPRESSION(S) / ED DIAGNOSES    Final diagnoses:  [name] ([place])  Cirrhosis of liver with ascites, unspecified hepatic cirrhosis type [facility])    ED Discharge Orders    None      Portions of this note were generated with dragon dictation software. Dictation errors may occur despite best attempts at proofreading.    Carrie Mew, MD 08/10/17 1005

## 2017-08-10 NOTE — ED Notes (Signed)
Spoke with Dr. Holley Raring via phone, states to administer albumin after dialysis.

## 2017-08-10 NOTE — ED Notes (Signed)
Pt in [name] for paracentesis, states the procedure will take about 45-1hr to complete and then he can go directly to dialysis after.

## 2017-08-10 NOTE — Progress Notes (Signed)
Pre HD assessment   08/10/17 1153  Vital Signs  Temp 97.7 F (36.5 C)  Temp Source Oral  Pulse Rate 84  Pulse Rate Source Monitor  Resp 18  BP 127/81  BP Location Left Arm  BP Method Automatic  Patient Position (if appropriate) Lying  Oxygen Therapy  SpO2 100 %  O2 Device Room Air  Dialysis Weight  Weight  (unable to weigh pt in chair, no scale available )  Type of Weight Pre-Dialysis  Time-Out for Hemodialysis  What Procedure? HD   Pt Identifiers(min of two) First/Last Name;MRN/Account#  Correct Site? Yes  Correct Side? Yes  Correct Procedure? Yes  Consents Verified? Yes  Rad Studies Available? N/A  Safety Precautions Reviewed? Yes  Engineer, civil (consulting) Number  ([id number])  Station Number 3  UF/Alarm Test Passed  Conductivity: Meter 13.8  Conductivity: Machine  14  pH 7.6  Reverse Osmosis main  Normal Saline Lot Number [id number]  Dialyzer Lot Number [id number]  Disposable Set Lot Number [id number]  Machine Temperature 98.6 F (37 C)  Musician and Audible Yes  Blood Lines Intact and Secured Yes  Pre Treatment Patient Checks  Vascular access used during treatment Catheter  Patient is receiving dialysis in a chair Yes  Hepatitis B Surface Antigen Results Negative  Date Hepatitis B Surface Antigen Drawn 06/16/17  Hepatitis B Surface Antibody  (>10)  Date Hepatitis B Surface Antibody Drawn 03/01/17  Hemodialysis Consent Verified Yes  Hemodialysis Standing Orders Initiated Yes  ECG (Telemetry) Monitor On Yes  Prime Ordered Normal Saline  Length of  DialysisTreatment -hour(s) 3.5 Hour(s)  Dialyzer Elisio 17H NR  Dialysis Anticoagulant None  Dialysate Flow Ordered 800  Blood Flow Rate Ordered 400 mL/min  Ultrafiltration Goal 2 Liters  Pre Treatment Labs Phosphorus (potassium)  Dialysis Blood Pressure Support Ordered Normal Saline  Education / Care Plan  Dialysis Education Provided Yes  Documented Education in Care Plan Yes  Hemodialysis Catheter Right  Internal jugular Double-lumen  No Placement Date or Time found.   Placed prior to admission: Yes  Orientation: Right  Access Location: Internal jugular  Hemodialysis Catheter Type: Double-lumen  Site Condition No complications  Blue Lumen Status Heparin locked  Red Lumen Status Heparin locked  Purple Lumen Status N/A  Dressing Type Biopatch  Dressing Status Clean;Dry;Intact  Drainage Description None  Dressing Change Due 08/15/17

## 2017-08-10 NOTE — ED Triage Notes (Signed)
Pt is here for dialysis

## 2017-08-10 NOTE — Progress Notes (Signed)
[facility]  ROUNDING NOTE   Subjective:  Patient seen and evaluated during hemodialysis. Potassium noted to be quite high at 7.4 therefore patient placed on 1K bath. He also underwent paracentesis and will receive albumin towards the end of dialysis treatment today.   Objective:  Vital signs in last 24 hours:  Temp:  [97.7 F (36.5 C)-98.1 F (36.7 C)] 97.7 F (36.5 C) (05/30 1153) Pulse Rate:  [72-93] 76 (05/30 1400) Resp:  [12-23] 13 (05/30 1400) BP: (112-137)/(56-87) 112/61 (05/30 1400) SpO2:  [98 %-100 %] 100 % (05/30 1400)  Weight change:  Filed Weights    Intake/Output: No intake/output data recorded.   Intake/Output this shift:  No intake/output data recorded.  Physical Exam: General: NAD  Head: Normocephalic, atraumatic. Moist oral mucosal membranes  Eyes: Anicteric  Neck: Supple  Lungs:  normal effort, mild bilateral rales  Heart: Regular rate and rhythm  Abdomen:  Soft, nontender, distended  Extremities: 1+ peripheral edema.  Neurologic: Nonfocal, moving all four extremities  Skin: No lesions  Access: R IJ permcath    Basic Metabolic Panel: Recent Labs  Lab 08/04/17 1425 08/08/17 1222 08/10/17 1231  NA 137 140  --   K 6.4* 4.6 7.4*  CL 101 101  --   CO2 28 29  --   GLUCOSE 91 97  --   BUN 79* 39*  --   CREATININE 8.56* 5.11*  --   CALCIUM 8.9 8.4*  --   PHOS 3.8 3.2  3.1 3.2    Liver Function Tests: Recent Labs  Lab 08/04/17 1425 08/08/17 1222  ALBUMIN 2.8* 2.6*   No results for input(s): LIPASE, AMYLASE in the last 168 hours. No results for input(s): AMMONIA in the last 168 hours.  CBC: Recent Labs  Lab 08/04/17 1425 08/08/17 1222  WBC 7.4 5.7  HGB 8.3* 8.0*  HCT 27.9* 26.1*  MCV 82.8 81.4  PLT 351 321    Cardiac Enzymes: No results for input(s): CKTOTAL, CKMB, CKMBINDEX, TROPONINI in the last 168 hours.  BNP: Invalid input(s): POCBNP  CBG: No results for input(s): GLUCAP in the last 168  hours.  Microbiology: Results for orders placed or performed during the hospital encounter of 07/12/17  Culture, blood (Routine X 2) w Reflex to ID Panel     Status: None   Collection Time: 07/12/17  4:43 PM  Result Value Ref Range Status   Specimen Description BLOOD LINE CENTRAL  Final   Special Requests   Final    BOTTLES DRAWN AEROBIC AND ANAEROBIC Blood Culture adequate volume   Culture   Final    NO GROWTH 5 DAYS Performed at [facility], [address] [place],  [address]    Report Status 07/17/2017 FINAL  Final    Coagulation Studies: No results for input(s): LABPROT, INR in the last 72 hours.  Urinalysis: No results for input(s): COLORURINE, LABSPEC, PHURINE, GLUCOSEU, HGBUR, BILIRUBINUR, KETONESUR, PROTEINUR, UROBILINOGEN, NITRITE, LEUKOCYTESUR in the last 72 hours.  Invalid input(s): APPERANCEUR    Imaging: US Paracentesis  Result Date: 08/10/2017 INDICATION: History of versus erosive with recurrent symptomatic abdominal ascites. Please perform ultrasound-guided paracentesis for diagnostic and therapeutic purposes. EXAM: ULTRASOUND-GUIDED PARACENTESIS COMPARISON:  Multiple previous ultrasound-guided paracenteses, most recently on 07/24/2017; CT abdomen and pelvis - 01/12/2017 MEDICATIONS: None. COMPLICATIONS: None immediate. TECHNIQUE: Informed written consent was obtained from the patient after a discussion of the risks, benefits and alternatives to treatment. A timeout was performed prior to the initiation of the procedure. Initial  ultrasound scanning demonstrates a large amount of ascites within the right lower abdominal quadrant. The right lower abdomen was prepped and draped in the usual sterile fashion. 1% lidocaine with epinephrine was used for local anesthesia. An ultrasound image was saved for documentation purposed. An 8 Fr Safe-T-Centesis catheter was introduced. The paracentesis was performed. The catheter was removed and a dressing was applied.  The patient tolerated the procedure well without immediate post procedural complication. FINDINGS: A total of approximately 6 liters of serous fluid was removed. Samples were sent to the laboratory as requested by the clinical team. IMPRESSION: Successful ultrasound-guided paracentesis yielding 6 liters of peritoneal fluid. Electronically Signed   By: Sandi Mariscal M.D.   On: 08/10/2017 13:57     Medications:       Assessment/ Plan:  [name] is a 57 y.o. black male withend stage renal disease on hemodialysis secondary to Alport's syndrome, ascites, hypertension, anemia of chronic kidney disease, coronary artery disease, peripheral vascular disease, hyperlipidemia, gastrointestinal AVMs, pulmonary hypertension  nooutpatientdialysis unit currently  1.  Hyperkalemia K noted to be quite high, currently 7.4, patient dialyzing against a 1K bath.    2.  End-stage renal disease:  Pt usually comes in through ED on MWF. - Missed yesterday, came in for HD today.   3. Anemia with chronic kidney disease: continue Epogen 10,000 units IV with dialysis.  4. Dialysis device complication: Continue to use R IJ permcath.   5. Hypertension: receives PRN labetalol during treatments.   6. Ascites: - Patient had paracentesis performed today.  We will administer albumin 25 g towards end of dialysis treatment.   LOS: 0 Stanley [name] 5/30/20192:35 PM

## 2017-08-10 NOTE — Progress Notes (Signed)
Pt walked out of the dialysis unit just now. He stated that he was tired of waiting and his ride was outside, MD/RN aware.    08/10/17 1547  Hand-Off documentation  Report given to (Full Name) Elder Love

## 2017-08-10 NOTE — ED Notes (Signed)
EDP spoke with radiology and states they will take the pt before his dialysis for paracentesis.

## 2017-08-10 NOTE — Progress Notes (Signed)
Post HD assessment. Pt tolerated tx well without c/o or complication. Pt had a critical potassium of 7.4 and was put on a 1K bath during tx, MD aware. Net UF 1955, goal met.    08/10/17 1535  Vital Signs  Temp 98.1 F (36.7 C)  Temp Source Oral  Pulse Rate 85  Pulse Rate Source Monitor  Resp 13  BP (!) 112/56  BP Location Left Arm  BP Method Automatic  Patient Position (if appropriate) Lying  Oxygen Therapy  SpO2 100 %  O2 Device Room Air  Dialysis Weight  Weight  (pt in chair, no scale available )  Type of Weight Post-Dialysis  Post-Hemodialysis Assessment  Rinseback Volume (mL) 250 mL  KECN 77 V  Dialyzer Clearance Lightly streaked  Duration of HD Treatment -hour(s) 3.5 hour(s)  Hemodialysis Intake (mL) 500 mL  UF Total -Machine (mL) 2455 mL  Net UF (mL) 1955 mL  Tolerated HD Treatment Yes  Education / Care Plan  Dialysis Education Provided Yes  Documented Education in Care Plan Yes  Hemodialysis Catheter Right Internal jugular Double-lumen  No Placement Date or Time found.   Placed prior to admission: Yes  Orientation: Right  Access Location: Internal jugular  Hemodialysis Catheter Type: Double-lumen  Site Condition No complications  Blue Lumen Status Heparin locked  Red Lumen Status Heparin locked  Purple Lumen Status N/A  Catheter fill solution Heparin 1000 units/ml  Catheter fill volume (Arterial) 1.5 cc  Catheter fill volume (Venous) 1.5  Dressing Type Biopatch  Dressing Status Clean;Dry;Intact  Drainage Description None  Post treatment catheter status Capped and Clamped

## 2017-08-10 NOTE — Procedures (Signed)
Pre Procedural Dx: Symptomatic Ascites Post Procedural Dx: Same  Successful US guided paracentesis yielding 6 L of serous ascitic fluid. Sample sent to laboratory as requested.  EBL: None  Complications: None immediate  Jay Caylynn Minchew, MD Pager #: [phone]   

## 2017-08-10 NOTE — Progress Notes (Signed)
Pre HD assessment    08/10/17 1154  Neurological  Level of Consciousness Alert  Orientation Level Oriented X4  Respiratory  Respiratory Pattern Regular;Unlabored  Chest Assessment Chest expansion symmetrical  Cough Non-productive  Cardiac  ECG Monitor Yes  Vascular  R Radial Pulse +2  L Radial Pulse +2  Edema Generalized  Integumentary  Integumentary (WDL) X  Skin Color Appropriate for ethnicity  Musculoskeletal  Musculoskeletal (WDL) X  Generalized Weakness Yes  Assistive Device None  GU Assessment  Genitourinary (WDL) X  Genitourinary Symptoms  (HD)  Psychosocial  Psychosocial (WDL) WDL

## 2017-08-10 NOTE — Progress Notes (Signed)
Post HD assessment    08/10/17 1538  Neurological  Level of Consciousness Alert  Orientation Level Oriented X4  Respiratory  Respiratory Pattern Regular;Unlabored  Chest Assessment Chest expansion symmetrical  Cough Non-productive  Cardiac  ECG Monitor Yes  Vascular  R Radial Pulse +2  L Radial Pulse +2  Edema Generalized  Integumentary  Integumentary (WDL) X  Skin Color Appropriate for ethnicity  Musculoskeletal  Musculoskeletal (WDL) X  Generalized Weakness Yes  Assistive Device None  GU Assessment  Genitourinary (WDL) X  Genitourinary Symptoms  (HD)  Psychosocial  Psychosocial (WDL) WDL

## 2017-08-10 NOTE — Progress Notes (Signed)
HD tx start    08/10/17 1200  Vital Signs  Pulse Rate 79  Pulse Rate Source Monitor  Resp (!) 21  BP 116/82  BP Location Left Arm  BP Method Automatic  Patient Position (if appropriate) Lying  Oxygen Therapy  SpO2 100 %  O2 Device Room Air  During Hemodialysis Assessment  Blood Flow Rate (mL/min) 400 mL/min  Arterial Pressure (mmHg) -160 mmHg  Venous Pressure (mmHg) 150 mmHg  Transmembrane Pressure (mmHg) 70 mmHg  Ultrafiltration Rate (mL/min) 710 mL/min  Dialysate Flow Rate (mL/min) 800 ml/min  Conductivity: Machine  14.2  HD Safety Checks Performed Yes  Dialysis Fluid Bolus Normal Saline  Bolus Amount (mL) 250 mL  Intra-Hemodialysis Comments Tx initiated  Hemodialysis Catheter Right Internal jugular Double-lumen  No Placement Date or Time found.   Placed prior to admission: Yes  Orientation: Right  Access Location: Internal jugular  Hemodialysis Catheter Type: Double-lumen  Blue Lumen Status Infusing  Red Lumen Status Infusing

## 2017-08-10 NOTE — Progress Notes (Signed)
Pt getting paracentesis, will come to dialysis directly after procedure.

## 2017-08-10 NOTE — Progress Notes (Signed)
Lab reported a critical potassium level of 7.4, MD aware. Pt's dialysate changed to a 1K bath.

## 2017-08-10 NOTE — ED Notes (Signed)
Spoke with RN in dialysis and states she should be ready for the past in a bout 12min.

## 2017-08-10 NOTE — Progress Notes (Signed)
HD tx end    08/10/17 1530  Vital Signs  Pulse Rate 84  Pulse Rate Source Monitor  Resp 15  BP (!) 122/57  BP Location Left Arm  BP Method Automatic  Patient Position (if appropriate) Lying  Oxygen Therapy  SpO2 100 %  O2 Device Room Air  During Hemodialysis Assessment  Dialysis Fluid Bolus Normal Saline  Bolus Amount (mL) 250 mL  Intra-Hemodialysis Comments Tx completed

## 2017-08-13 DIAGNOSIS — K7011 Alcoholic hepatitis with ascites: Secondary | ICD-10-CM | POA: Diagnosis not present

## 2017-08-13 NOTE — ED Notes (Signed)
not my pt - EMR clean up  had to address running infusion to discharge from OTF

## 2017-08-14 ENCOUNTER — Encounter: Payer: Self-pay | Admitting: Emergency Medicine

## 2017-08-14 ENCOUNTER — Other Ambulatory Visit: Payer: Self-pay

## 2017-08-14 ENCOUNTER — Non-Acute Institutional Stay
Admission: EM | Admit: 2017-08-14 | Discharge: 2017-08-14 | Disposition: A | Discharge status: Home / Self Care | Payer: Medicare Other | Attending: Emergency Medicine | Admitting: Emergency Medicine

## 2017-08-14 DIAGNOSIS — I739 Peripheral vascular disease, unspecified: Secondary | ICD-10-CM | POA: Insufficient documentation

## 2017-08-14 DIAGNOSIS — Z9115 Patient's noncompliance with renal dialysis: Secondary | ICD-10-CM | POA: Insufficient documentation

## 2017-08-14 DIAGNOSIS — Z86010 Personal history of colonic polyps: Secondary | ICD-10-CM | POA: Diagnosis not present

## 2017-08-14 DIAGNOSIS — Z992 Dependence on renal dialysis: Secondary | ICD-10-CM | POA: Diagnosis not present

## 2017-08-14 DIAGNOSIS — K7460 Unspecified cirrhosis of liver: Secondary | ICD-10-CM | POA: Insufficient documentation

## 2017-08-14 DIAGNOSIS — I2510 Atherosclerotic heart disease of native coronary artery without angina pectoris: Secondary | ICD-10-CM | POA: Diagnosis not present

## 2017-08-14 DIAGNOSIS — Z79899 Other long term (current) drug therapy: Secondary | ICD-10-CM | POA: Insufficient documentation

## 2017-08-14 DIAGNOSIS — E875 Hyperkalemia: Secondary | ICD-10-CM | POA: Insufficient documentation

## 2017-08-14 DIAGNOSIS — I120 Hypertensive chronic kidney disease with stage 5 chronic kidney disease or end stage renal disease: Secondary | ICD-10-CM | POA: Diagnosis not present

## 2017-08-14 DIAGNOSIS — Q8781 Alport syndrome: Secondary | ICD-10-CM | POA: Insufficient documentation

## 2017-08-14 DIAGNOSIS — D631 Anemia in chronic kidney disease: Secondary | ICD-10-CM | POA: Diagnosis not present

## 2017-08-14 DIAGNOSIS — E785 Hyperlipidemia, unspecified: Secondary | ICD-10-CM | POA: Insufficient documentation

## 2017-08-14 DIAGNOSIS — J449 Chronic obstructive pulmonary disease, unspecified: Secondary | ICD-10-CM | POA: Insufficient documentation

## 2017-08-14 DIAGNOSIS — I272 Pulmonary hypertension, unspecified: Secondary | ICD-10-CM | POA: Diagnosis not present

## 2017-08-14 DIAGNOSIS — I132 Hypertensive heart and chronic kidney disease with heart failure and with stage 5 chronic kidney disease, or end stage renal disease: Secondary | ICD-10-CM | POA: Diagnosis not present

## 2017-08-14 DIAGNOSIS — F17210 Nicotine dependence, cigarettes, uncomplicated: Secondary | ICD-10-CM | POA: Insufficient documentation

## 2017-08-14 DIAGNOSIS — N186 End stage renal disease: Secondary | ICD-10-CM | POA: Insufficient documentation

## 2017-08-14 DIAGNOSIS — I509 Heart failure, unspecified: Secondary | ICD-10-CM | POA: Insufficient documentation

## 2017-08-14 MED ORDER — GI COCKTAIL ~~LOC~~
30.0000 mL | Freq: Once | ORAL | Status: AC
  Administered 2017-08-14: 30 mL via ORAL
  Filled 2017-08-14: qty 30

## 2017-08-14 MED ORDER — CHLORHEXIDINE GLUCONATE CLOTH 2 % EX PADS: 6.0000 | MEDICATED_PAD | Freq: Every day | CUTANEOUS | Status: DC

## 2017-08-14 MED ORDER — ALUM & MAG HYDROXIDE-SIMETH 200-200-20 MG/5ML PO SUSP
15.0000 mL | Freq: Once | ORAL | Status: AC
  Administered 2017-08-14: 15 mL via ORAL
  Filled 2017-08-14: qty 30

## 2017-08-14 MED ORDER — EPOETIN ALFA 10000 UNIT/ML IJ SOLN
10000.0000 [IU] | Freq: Once | INTRAMUSCULAR | Status: AC
  Administered 2017-08-14: 10000 [IU] via INTRAVENOUS

## 2017-08-14 NOTE — ED Provider Notes (Signed)
[facility] Emergency Department Provider Note   ____________________________________________   First MD Initiated Contact with Patient 08/14/17 1210     (approximate)  I have reviewed the triage vital signs and the nursing notes.   HISTORY  Chief Complaint dialysis    HPI Stanley [name] is a 57 y.o. male  with a history of CAD, cirrhosis, CHF, hypertension, end-stage renal disease on dialysis, and adequate noncompliance who returns to the ED today for routine dialysis. He reports no acute complaints Except for the heartburn he usually gets. He has medicines for but did not take one today.. No fevers or belly pain. He feels totally fine and just wants his dialysis.     Past Medical History:  Diagnosis Date  . Alcohol abuse   . CHF (congestive heart failure) ([place])   . Cirrhosis ([place])   . Coronary artery disease 2009  . Drug abuse ([place])   . End stage renal disease on dialysis [facility]) NEPHROLOGIST-   DR [facility]  IN [place]   HEMODIALYSIS --   TUES/  THURS/  SAT  . Gastrointestinal bleed 06/13/2017   From chart...hx of multiple GI bleeds  . GERD (gastroesophageal reflux disease)   . Hyperlipidemia   . Hypertension   . PAD (peripheral artery disease) ([place])   . Renal insufficiency    Per pt, 32 oz fluid restriction per day  . S/P triple vessel bypass 06/09/2016   2009ish  . Suicidal ideation    & HOMICIDAL IDEATION --  06-16-2013   ADMITTED TO BEHAVIOR HEALTH    Patient Active Problem List   Diagnosis Date Noted  . ESRD (end stage renal disease) on dialysis ([place]) 07/28/2017  . Protein-calorie malnutrition, severe 06/14/2017  . Encounter for dialysis [facility])   . Palliative care by specialist   . Goals of care, counseling/discussion   . Malnutrition of moderate degree 06/05/2017  . Secondary esophageal varices without bleeding ([place])   . Stomach irritation   . Idiopathic esophageal varices without bleeding ([place])   . Alcoholic hepatitis with  ascites 05/24/2017  . ESRD (end stage renal disease) ([place]) 04/28/2017  . Uremia 03/08/2017  . ESRD on hemodialysis ([place]) 03/03/2017  . Weakness 02/28/2017  . Hypocalcemia 02/22/2017  . Shortness of breath 11/26/2016  . COPD (chronic obstructive pulmonary disease) ([place]) 10/30/2016  . COPD exacerbation ([place]) 10/29/2016  . Anemia   . Heme positive stool   . Ulceration of intestine   . Benign neoplasm of transverse colon   . Acute gastrointestinal hemorrhage   . Esophageal candidiasis ([place])   . Angiodysplasia of intestinal tract   . Acute respiratory failure with hypoxia ([place]) 07/03/2016  . GI bleeding 06/24/2016  . Rectal bleeding 06/14/2016  . Anemia of chronic disease 06/01/2016  . MRSA carrier 06/01/2016  . Chronic renal failure 05/23/2016  . Ischemic heart disease 05/23/2016  . Angiodysplasia of small intestine   . Melena   . Small bowel bleed not requiring more than 4 units of blood in 24 hours, ICU, or surgery   . Anemia due to chronic blood loss   . Abdominal pain 05/05/2016  . Acute posthemorrhagic anemia 04/17/2016  . Gastrointestinal bleed 04/17/2016  . History of esophagogastroduodenoscopy (EGD) 04/17/2016  . Elevated troponin 04/17/2016  . Alcohol abuse 04/17/2016  . Upper GI bleed 01/19/2016  . Blood in stool   . Angiodysplasia of stomach and duodenum with hemorrhage   . Gastritis   . Reflux esophagitis   . GI bleed 05/16/2015  .  Acute GI bleeding   . Symptomatic anemia 04/30/2015  . HTN (hypertension) 04/06/2015  . GERD (gastroesophageal reflux disease) 04/06/2015  . HLD (hyperlipidemia) 04/06/2015  . Dyspnea 04/06/2015  . Cirrhosis of liver with ascites ([place]) 04/06/2015  . Ascites 04/06/2015  . GIB (gastrointestinal bleeding) 03/23/2015  . Homicidal ideation 06/19/2013  . Suicidal intent 06/19/2013  . Homicidal ideations 06/19/2013  . Hyperkalemia 06/16/2013  . Mandible fracture ([place]) 06/05/2013  . Fracture, mandible ([place]) 06/02/2013  . Coronary  atherosclerosis of native coronary artery 06/02/2013  . ESRD on dialysis ([place]) 06/02/2013  . Mandible open fracture ([place]) 06/02/2013    Past Surgical History:  Procedure Laterality Date  . A/V FISTULAGRAM Right 06/06/2017   Procedure: A/V FISTULAGRAM;  Surgeon: Katha Cabal, MD;  Location: [place] CV LAB;  Service: Cardiovascular;  Laterality: Right;  . A/V SHUNT INTERVENTION N/A 06/06/2017   Procedure: A/V SHUNT INTERVENTION;  Surgeon: Katha Cabal, MD;  Location: [place] CV LAB;  Service: Cardiovascular;  Laterality: N/A;  . AGILE CAPSULE N/A 06/19/2016   Procedure: AGILE CAPSULE;  Surgeon: [name], MD;  Location: [facility] ENDOSCOPY;  Service: Endoscopy;  Laterality: N/A;  . COLONOSCOPY WITH PROPOFOL N/A 06/18/2016   Procedure: COLONOSCOPY WITH PROPOFOL;  Surgeon: [name], MD;  Location: [facility] ENDOSCOPY;  Service: Endoscopy;  Laterality: N/A;  . COLONOSCOPY WITH PROPOFOL N/A 08/12/2016   Procedure: COLONOSCOPY WITH PROPOFOL;  Surgeon: [name], MD;  Location: [facility] ENDOSCOPY;  Service: Endoscopy;  Laterality: N/A;  . COLONOSCOPY WITH PROPOFOL N/A 05/05/2017   Procedure: COLONOSCOPY WITH PROPOFOL;  Surgeon: Manya Silvas, MD;  Location: [facility] ENDOSCOPY;  Service: Endoscopy;  Laterality: N/A;  . CORONARY ANGIOPLASTY  ?   PT UNABLE TO TELL IF  BEFORE OR AFTER  CABG  . CORONARY ARTERY BYPASS GRAFT  2008  ([place])   3 VESSEL  . DIALYSIS FISTULA CREATION  LAST SURGERY  APPOX  2008  . ENTEROSCOPY N/A 05/10/2016   Procedure: ENTEROSCOPY;  Surgeon: Jerene Bears, MD;  Location: [place];  Service: Gastroenterology;  Laterality: N/A;  . ENTEROSCOPY N/A 08/12/2016   Procedure: ENTEROSCOPY;  Surgeon: [name], MD;  Location: [facility] ENDOSCOPY;  Service: Endoscopy;  Laterality: N/A;  . ENTEROSCOPY Left 06/03/2017   Procedure: ENTEROSCOPY;  Surgeon: Virgel Manifold, MD;  Location: [facility] ENDOSCOPY;  Service: Endoscopy;  Laterality: Left;  Procedure date will  ultimately depend on when patient is medically optimized before the procedure, pending hemodialysis and blood transfusions etc. Will place on schedule and change depending on clinical status.   . ENTEROSCOPY N/A 06/05/2017   Procedure: ENTEROSCOPY;  Surgeon: Virgel Manifold, MD;  Location: [facility] ENDOSCOPY;  Service: Endoscopy;  Laterality: N/A;  . ENTEROSCOPY N/A 06/15/2017   Procedure: Push ENTEROSCOPY;  Surgeon: [name], MD;  Location: [facility] ENDOSCOPY;  Service: Endoscopy;  Laterality: N/A;  . ESOPHAGOGASTRODUODENOSCOPY N/A 05/07/2015   Procedure: ESOPHAGOGASTRODUODENOSCOPY (EGD);  Surgeon: Hulen Luster, MD;  Location: [facility] ENDOSCOPY;  Service: Endoscopy;  Laterality: N/A;  . ESOPHAGOGASTRODUODENOSCOPY (EGD) WITH PROPOFOL N/A 05/17/2015   Procedure: ESOPHAGOGASTRODUODENOSCOPY (EGD) WITH PROPOFOL;  Surgeon: [name], MD;  Location: [facility] ENDOSCOPY;  Service: Endoscopy;  Laterality: N/A;  . ESOPHAGOGASTRODUODENOSCOPY (EGD) WITH PROPOFOL N/A 01/20/2016   Procedure: ESOPHAGOGASTRODUODENOSCOPY (EGD) WITH PROPOFOL;  Surgeon: [name], MD;  Location: [facility] ENDOSCOPY;  Service: Endoscopy;  Laterality: N/A;  . ESOPHAGOGASTRODUODENOSCOPY (EGD) WITH PROPOFOL N/A 04/17/2016   Procedure: ESOPHAGOGASTRODUODENOSCOPY (EGD) WITH PROPOFOL;  Surgeon: Lin Landsman, MD;  Location: [facility] ENDOSCOPY;  Service: Gastroenterology;  Laterality: N/A;  . ESOPHAGOGASTRODUODENOSCOPY (EGD) WITH PROPOFOL  05/09/2016   Procedure: ESOPHAGOGASTRODUODENOSCOPY (EGD) WITH PROPOFOL;  Surgeon: Jerene Bears, MD;  Location: [place];  Service: Endoscopy;;  . ESOPHAGOGASTRODUODENOSCOPY (EGD) WITH PROPOFOL N/A 06/16/2016   Procedure: ESOPHAGOGASTRODUODENOSCOPY (EGD) WITH PROPOFOL;  Surgeon: [name], MD;  Location: [facility] ENDOSCOPY;  Service: Endoscopy;  Laterality: N/A;  . ESOPHAGOGASTRODUODENOSCOPY (EGD) WITH PROPOFOL N/A 05/05/2017   Procedure: ESOPHAGOGASTRODUODENOSCOPY (EGD) WITH PROPOFOL;  Surgeon: Manya Silvas, MD;   Location: [facility] ENDOSCOPY;  Service: Endoscopy;  Laterality: N/A;  . ESOPHAGOGASTRODUODENOSCOPY (EGD) WITH PROPOFOL N/A 06/15/2017   Procedure: ESOPHAGOGASTRODUODENOSCOPY (EGD) WITH PROPOFOL;  Surgeon: [name], MD;  Location: [facility] ENDOSCOPY;  Service: Endoscopy;  Laterality: N/A;  . GIVENS CAPSULE STUDY N/A 05/07/2016   Procedure: GIVENS CAPSULE STUDY;  Surgeon: Doran Stabler, MD;  Location: [place];  Service: Endoscopy;  Laterality: N/A;  . MANDIBULAR HARDWARE REMOVAL N/A 07/29/2013   Procedure: REMOVAL OF ARCH BARS;  Surgeon: Theodoro Kos, DO;  Location: [place];  Service: Plastics;  Laterality: N/A;  . ORIF MANDIBULAR FRACTURE N/A 06/05/2013   Procedure: REPAIR OF MANDIBULAR FRACTURE x 2 with maxillo-mandibular fixation ;  Surgeon: Theodoro Kos, DO;  Location: [place];  Service: Plastics;  Laterality: N/A;  . PERIPHERAL ARTERIAL STENT GRAFT Left     Prior to Admission medications   Medication Sig Start Date End Date Taking? Authorizing Provider  albuterol (PROVENTIL HFA;VENTOLIN HFA) 108 (90 Base) MCG/ACT inhaler Inhale 2 puffs into the lungs every 6 (six) hours as needed for wheezing or shortness of breath. 04/21/17   Lisa Roca, MD  alum & mag hydroxide-simeth (MAALOX/MYLANTA) 200-200-20 MG/5ML suspension Take 15 mLs every 4 (four) hours as needed by mouth for indigestion or heartburn. Patient not taking: Reported on 04/19/2017 01/25/17   Demetrios Loll, MD  atorvastatin (LIPITOR) 40 MG tablet Take 1 tablet (40 mg total) by mouth daily. 11/16/16   Vaughan Basta, MD  budesonide-formoterol (SYMBICORT) 160-4.5 MCG/ACT inhaler Inhale 2 puffs into the lungs daily. 11/16/16   Vaughan Basta, MD  calcium acetate (PHOSLO) 667 MG capsule Take 2,001 mg by mouth 3 (three) times daily.    [provider]  doxycycline (VIBRA-TABS) 100 MG tablet Take 1 tablet (100 mg total) by mouth 2 (two) times daily. 07/10/17   Harvest Dark, MD  ferrous sulfate 325 (65  FE) MG tablet Take 1 tablet by mouth 3 (three) times daily. 01/21/17   [provider]  furosemide (LASIX) 80 MG tablet Take 1 tablet (80 mg total) daily by mouth. 01/25/17   Demetrios Loll, MD  gabapentin (NEURONTIN) 300 MG capsule Take 1 capsule by mouth daily as directed 08/16/16   [provider]  ipratropium-albuterol (DUONEB) 0.5-2.5 (3) MG/3ML SOLN Take 3 mLs by nebulization every 6 (six) hours as needed. 11/28/16   Loletha Grayer, MD  labetalol (NORMODYNE) 100 MG tablet Take 100 mg by mouth daily. 03/24/17   [provider]  Multiple Vitamins-Minerals-FA (DIALYVITE SUPREME D) 3 MG TABS Take 1 tablet (3 mg total) by mouth daily. 11/28/16   Loletha Grayer, MD  nitroGLYCERIN (NITROSTAT) 0.4 MG SL tablet Place 1 tablet (0.4 mg total) under the tongue every 5 (five) minutes as needed. 04/12/16   Wende Bushy, MD  pantoprazole (PROTONIX) 40 MG tablet Take 1 tablet (40 mg total) by mouth daily. 11/28/16   Loletha Grayer, MD  Spacer/Aero Chamber Mouthpiece MISC 1 Units by Does not apply route every 4 (four) hours as  needed (wheezing). 02/19/17   Darel Hong, MD  tiotropium (SPIRIVA HANDIHALER) 18 MCG inhalation capsule Place 1 capsule (18 mcg total) into inhaler and inhale daily. 11/16/16 11/16/17  Vaughan Basta, MD    Allergies Patient has no known allergies.  Family History  Problem Relation Age of Onset  . Colon cancer Mother   . Cancer Father   . Cancer Sister   . Kidney disease Brother     Social History Social History   Tobacco Use  . Smoking status: Current Every Day Smoker    Packs/day: 0.15    Years: 40.00    Pack years: 6.00    Types: Cigarettes  . Smokeless tobacco: Never Used  Substance Use Topics  . Alcohol use: No    Comment: pt reports quitting after learning about cirrhosis  . Drug use: No    Frequency: 7.0 times per week    Types: Marijuana, Cocaine    Review of Systems  Constitutional: No fever/chills Eyes: No visual  changes. ENT: No sore throat. Cardiovascular: Denies chest pain. Respiratory: Denies shortness of breath. Gastrointestinal: No abdominal pain except for his usual heartburn .  No nausea, no vomiting.  No diarrhea.  No constipation. Genitourinary: Negative for dysuria. Musculoskeletal: Negative for back pain. Skin: Negative for rash. Neurological: Negative for headaches, focal weakness  ____________________________________________   PHYSICAL EXAM:  VITAL SIGNS: ED Triage Vitals  Enc Vitals Group     BP 08/14/17 1159 (!) 142/70     Pulse Rate 08/14/17 1159 84     Resp 08/14/17 1159 18     Temp 08/14/17 1159 98.2 F (36.8 C)     Temp Source 08/14/17 1159 Oral     SpO2 08/14/17 1159 100 %     Weight 08/14/17 1200 159 lb (72.1 kg)     Height 08/14/17 1200 6\' 3"  (1.905 m)     Head Circumference --      Peak Flow --      Pain Score 08/14/17 1200 0     Pain Loc --      Pain Edu? --      Excl. in [place]? --     Constitutional: Alert and oriented. Well appearing and in no acute distress. Eyes: Conjunctivae are normal.  Head: Atraumatic. Nose: No congestion/rhinnorhea. Mouth/Throat: Mucous membranes are moist.  Oropharynx non-erythematous. Neck: No stridor.  Cardiovascular: Normal rate, regular rhythm. Grossly normal heart sounds.  Good peripheral circulation. Respiratory: Normal respiratory effort.  No retractions. Lungs CTAB. Gastrointestinal: Soft and nontender. No distention. No abdominal bruits. No CVA tenderness. Musculoskeletal: No lower extremity tenderness nor edema.  Neurologic:  Normal speech and language. No gross focal neurologic deficits are appreciated. No gait instability. Skin:  Skin is warm, dry and intact. No rash noted. Psychiatric: Mood and affect are normal. Speech and behavior are normal.  ____________________________________________   LABS (all labs ordered are listed, but only abnormal results are displayed)  Labs Reviewed - No data to  display ____________________________________________  EKG  ____________________________________________  RADIOLOGY  ED MD interpretation:    Official radiology report(s): No results found.  ____________________________________________   PROCEDURES  Procedure(s) performed:  Procedures  Critical Care performed:   ____________________________________________   INITIAL IMPRESSION / ASSESSMENT AND PLAN / ED COURSE  heartburn and gets better with GI cocktail as he usually does.        ____________________________________________   FINAL CLINICAL IMPRESSION(S) / ED DIAGNOSES  Final diagnoses:  Dialysis patient [facility])     ED Discharge Orders  None       Note:  This document was prepared using Dragon voice recognition software and may include unintentional dictation errors.    Nena Polio, MD 08/14/17 1214

## 2017-08-14 NOTE — Progress Notes (Signed)
Pre HD assessment    08/14/17 1250  Neurological  Level of Consciousness Alert  Orientation Level Oriented X4  Respiratory  Respiratory Pattern Regular;Unlabored  Chest Assessment Chest expansion symmetrical  Cough Non-productive  Cardiac  ECG Monitor Yes  Vascular  R Radial Pulse +2  L Radial Pulse +2  Edema Generalized  Integumentary  Integumentary (WDL) X  Skin Color Appropriate for ethnicity  Musculoskeletal  Musculoskeletal (WDL) X  Generalized Weakness Yes  GU Assessment  Genitourinary (WDL) X  Genitourinary Symptoms  (HD)  Psychosocial  Psychosocial (WDL) WDL

## 2017-08-14 NOTE — Progress Notes (Signed)
Post HD assessment. Pt tolerated tx well without c/o or complication. Net UF 4501, goal met.    08/14/17 1709  Vital Signs  Temp 98 F (36.7 C)  Temp Source Oral  Pulse Rate 89  Pulse Rate Source Monitor  Resp 17  BP 114/63  BP Location Left Arm  BP Method Automatic  Patient Position (if appropriate) Lying  Oxygen Therapy  SpO2 100 %  O2 Device Room Air  Dialysis Weight  Weight  (pt in chair, no scale available )  Type of Weight Post-Dialysis  Post-Hemodialysis Assessment  Rinseback Volume (mL) 250 mL  KECN 90 V  Dialyzer Clearance Lightly streaked  Duration of HD Treatment -hour(s) 3.5 hour(s)  Hemodialysis Intake (mL) 500 mL  UF Total -Machine (mL) 5001 mL  Net UF (mL) 4501 mL  Tolerated HD Treatment Yes  Education / Care Plan  Dialysis Education Provided Yes  Hemodialysis Catheter Right Internal jugular Double-lumen  No Placement Date or Time found.   Placed prior to admission: Yes  Orientation: Right  Access Location: Internal jugular  Hemodialysis Catheter Type: Double-lumen  Site Condition No complications  Blue Lumen Status Heparin locked  Red Lumen Status Heparin locked  Purple Lumen Status N/A  Catheter fill solution Heparin 1000 units/ml  Catheter fill volume (Arterial) 1.5 cc  Catheter fill volume (Venous) 1.5  Dressing Type Biopatch  Dressing Status Clean;Dry;Intact  Drainage Description None  Post treatment catheter status Capped and Clamped

## 2017-08-14 NOTE — Progress Notes (Signed)
Post HD assessment    08/14/17 1711  Neurological  Level of Consciousness Alert  Orientation Level Oriented X4  Respiratory  Respiratory Pattern Regular;Unlabored  Chest Assessment Chest expansion symmetrical  Cough Non-productive  Cardiac  ECG Monitor Yes  Vascular  R Radial Pulse +2  L Radial Pulse +2  Edema Generalized  Integumentary  Integumentary (WDL) X  Skin Color Appropriate for ethnicity  Musculoskeletal  Musculoskeletal (WDL) X  Generalized Weakness Yes  Assistive Device None  GU Assessment  Genitourinary (WDL) X  Genitourinary Symptoms  (HD)  Psychosocial  Psychosocial (WDL) WDL

## 2017-08-14 NOTE — ED Triage Notes (Signed)
Patient here for scheduled dialysis.

## 2017-08-14 NOTE — ED Provider Notes (Signed)
-----------------------------------------   5:43 PM on 08/14/2017 -----------------------------------------  Patient back from dialysis, ambulating without difficulty, no respiratory distress, drinking a soda.  Discharging as per usual.   Hinda Kehr, MD 08/14/17 1744

## 2017-08-14 NOTE — Progress Notes (Signed)
HD tx end   08/14/17 1701  Vital Signs  Pulse Rate 88  Pulse Rate Source Monitor  Resp 19  BP 120/68  BP Location Left Arm  BP Method Automatic  Patient Position (if appropriate) Lying  Oxygen Therapy  SpO2 98 %  O2 Device Room Air  During Hemodialysis Assessment  Dialysis Fluid Bolus Normal Saline  Bolus Amount (mL) 250 mL  Intra-Hemodialysis Comments Tx completed

## 2017-08-14 NOTE — Progress Notes (Signed)
Pre HD Tx    08/14/17 1253  Vital Signs  Temp 98.1 F (36.7 C)  Temp Source Oral  Pulse Rate 80  Pulse Rate Source Monitor  Resp 12  BP (!) 143/92  BP Location Left Arm  BP Method Automatic  Patient Position (if appropriate) Sitting  Oxygen Therapy  SpO2 100 %  O2 Device Room Air  Pain Assessment  Pain Scale 0-10  Pain Score 0  Dialysis Weight  Weight  (unable to obtain pt in chair )  Type of Weight Pre-Dialysis  Time-Out for Hemodialysis  What Procedure? HD   Pt Identifiers(min of two) First/Last Name;MRN/Account#  Correct Site? Yes  Correct Side? Yes  Correct Procedure? Yes  Consents Verified? Yes  Rad Studies Available? N/A  Safety Precautions Reviewed? Yes  [employer] Number [phone]  Station Number 1  UF/Alarm Test Passed  Conductivity: Meter 14  Conductivity: Machine  14.2  pH 7.4  Reverse Osmosis Main  Normal Saline Lot Number [id number]  Dialyzer Lot Number [id number]  Disposable Set Lot Number [id number]  Machine Temperature 98.6 F (37 C)  Musician and Audible Yes  Blood Lines Intact and Secured Yes  Pre Treatment Patient Checks  Vascular access used during treatment Catheter  Patient is receiving dialysis in a chair Yes  Hepatitis B Surface Antigen Results Negative  Date Hepatitis B Surface Antigen Drawn 06/16/17  Hepatitis B Surface Antibody  (>10)  Date Hepatitis B Surface Antibody Drawn  (03/01/2017)  Hemodialysis Consent Verified Yes  Hemodialysis Standing Orders Initiated Yes  ECG (Telemetry) Monitor On Yes  Prime Ordered Normal Saline  Length of  DialysisTreatment -hour(s) 4 Hour(s)  Dialyzer Elisio 17H NR  Dialysate 2K, 2.5 Ca  Dialysis Anticoagulant None  Dialysate Flow Ordered 600  Blood Flow Rate Ordered 400 mL/min  Ultrafiltration Goal 3 Liters  Pre Treatment Labs CBC;Renal panel  Dialysis Blood Pressure Support Ordered Normal Saline  Education / Care Plan  Dialysis Education Provided Yes  Documented Education in  Care Plan Yes  Hemodialysis Catheter Right Internal jugular Double-lumen  No Placement Date or Time found.   Placed prior to admission: Yes  Orientation: Right  Access Location: Internal jugular  Hemodialysis Catheter Type: Double-lumen  Site Condition No complications  Dressing Status Clean;Dry;Intact

## 2017-08-14 NOTE — Progress Notes (Signed)
[facility]  ROUNDING NOTE   Subjective:   Patient complains of heart burn.   Objective:  Vital signs in last 24 hours:  Temp:  [98.2 F (36.8 C)] 98.2 F (36.8 C) (06/03 1159) Pulse Rate:  [84] 84 (06/03 1159) Resp:  [18] 18 (06/03 1159) BP: (142)/(70) 142/70 (06/03 1159) SpO2:  [100 %] 100 % (06/03 1159) Weight:  [72.1 kg (159 lb)] 72.1 kg (159 lb) (06/03 1200)  Weight change:  Filed Weights   08/14/17 1200  Weight: 72.1 kg (159 lb)    Intake/Output: No intake/output data recorded.   Intake/Output this shift:  No intake/output data recorded.  Physical Exam: General: NAD  Head: Normocephalic, atraumatic. Moist oral mucosal membranes  Eyes: Anicteric  Neck: Supple  Lungs:  normal effort, mild bilateral rales  Heart: Regular rate and rhythm  Abdomen:  Soft, nontender, distended  Extremities: no peripheral edema.  Neurologic: Nonfocal, moving all four extremities  Skin: No lesions  Access: R IJ permcath    Basic Metabolic Panel: Recent Labs  Lab 08/08/17 1222 08/10/17 1231  NA 140  --   K 4.6 7.4*  CL 101  --   CO2 29  --   GLUCOSE 97  --   BUN 39*  --   CREATININE 5.11*  --   CALCIUM 8.4*  --   PHOS 3.2  3.1 3.2    Liver Function Tests: Recent Labs  Lab 08/08/17 1222  ALBUMIN 2.6*   No results for input(s): LIPASE, AMYLASE in the last 168 hours. No results for input(s): AMMONIA in the last 168 hours.  CBC: Recent Labs  Lab 08/08/17 1222  WBC 5.7  HGB 8.0*  HCT 26.1*  MCV 81.4  PLT 321    Cardiac Enzymes: No results for input(s): CKTOTAL, CKMB, CKMBINDEX, TROPONINI in the last 168 hours.  BNP: Invalid input(s): POCBNP  CBG: No results for input(s): GLUCAP in the last 168 hours.  Microbiology: Results for orders placed or performed during the hospital encounter of 07/12/17  Culture, blood (Routine X 2) w Reflex to ID Panel     Status: None   Collection Time: 07/12/17  4:43 PM  Result Value Ref Range Status   Specimen Description BLOOD LINE CENTRAL  Final   Special Requests   Final    BOTTLES DRAWN AEROBIC AND ANAEROBIC Blood Culture adequate volume   Culture   Final    NO GROWTH 5 DAYS Performed at [facility], [address] [place], [place] [address]    Report Status 07/17/2017 FINAL  Final    Coagulation Studies: No results for input(s): LABPROT, INR in the last 72 hours.  Urinalysis: No results for input(s): COLORURINE, LABSPEC, PHURINE, GLUCOSEU, HGBUR, BILIRUBINUR, KETONESUR, PROTEINUR, UROBILINOGEN, NITRITE, LEUKOCYTESUR in the last 72 hours.  Invalid input(s): APPERANCEUR    Imaging: No results found.   Medications:       Assessment/ Plan:  Stanley [name] is a 57 y.o. black male withend stage renal disease on hemodialysis secondary to Alport's syndrome, ascites, hypertension, anemia of chronic kidney disease, coronary artery disease, peripheral vascular disease, hyperlipidemia, gastrointestinal AVMs, pulmonary hypertension  nooutpatientdialysis unit currently  1.  Hyperkalemia Hemodialysis with 2 K bath  2.  End-stage renal disease:  Pt usually comes in through ED on MWF. - Missed yesterday, came in for HD today.   3. Anemia with chronic kidney disease: continue Epogen 10,000 units IV with dialysis.  4. Dialysis device complication: Continue to use R IJ  permcath.   5. Hypertension: receives PRN labetalol during treatments.    LOS: 0 [name] 6/3/201912:58 PM

## 2017-08-14 NOTE — Progress Notes (Signed)
HD Tx started    08/14/17 1300  Vital Signs  Pulse Rate 81  Resp 18  BP (!) 145/97  Oxygen Therapy  SpO2 100 %  During Hemodialysis Assessment  Blood Flow Rate (mL/min) 400 mL/min  Arterial Pressure (mmHg) -140 mmHg  Venous Pressure (mmHg) 160 mmHg  Transmembrane Pressure (mmHg) 60 mmHg  Ultrafiltration Rate (mL/min) 1125 mL/min  Dialysate Flow Rate (mL/min) 600 ml/min  Conductivity: Machine  14  HD Safety Checks Performed Yes  Dialysis Fluid Bolus Normal Saline  Bolus Amount (mL) 250 mL  Intra-Hemodialysis Comments Tx initiated  Hemodialysis Catheter Right Internal jugular Double-lumen  No Placement Date or Time found.   Placed prior to admission: Yes  Orientation: Right  Access Location: Internal jugular  Hemodialysis Catheter Type: Double-lumen  Blue Lumen Status Flushed  Red Lumen Status Flushed

## 2017-08-18 ENCOUNTER — Emergency Department
Admission: EM | Admit: 2017-08-18 | Discharge: 2017-08-18 | Disposition: A | Discharge status: Home / Self Care | Payer: Medicare Other | Attending: Emergency Medicine | Admitting: Emergency Medicine

## 2017-08-18 ENCOUNTER — Other Ambulatory Visit: Payer: Self-pay

## 2017-08-18 ENCOUNTER — Encounter: Payer: Self-pay | Admitting: Emergency Medicine

## 2017-08-18 DIAGNOSIS — I2510 Atherosclerotic heart disease of native coronary artery without angina pectoris: Secondary | ICD-10-CM | POA: Diagnosis not present

## 2017-08-18 DIAGNOSIS — N186 End stage renal disease: Secondary | ICD-10-CM | POA: Diagnosis not present

## 2017-08-18 DIAGNOSIS — D631 Anemia in chronic kidney disease: Secondary | ICD-10-CM | POA: Diagnosis not present

## 2017-08-18 DIAGNOSIS — I132 Hypertensive heart and chronic kidney disease with heart failure and with stage 5 chronic kidney disease, or end stage renal disease: Secondary | ICD-10-CM | POA: Diagnosis not present

## 2017-08-18 DIAGNOSIS — R531 Weakness: Secondary | ICD-10-CM | POA: Diagnosis not present

## 2017-08-18 DIAGNOSIS — Z992 Dependence on renal dialysis: Secondary | ICD-10-CM | POA: Diagnosis not present

## 2017-08-18 DIAGNOSIS — I120 Hypertensive chronic kidney disease with stage 5 chronic kidney disease or end stage renal disease: Secondary | ICD-10-CM | POA: Diagnosis not present

## 2017-08-18 DIAGNOSIS — F17210 Nicotine dependence, cigarettes, uncomplicated: Secondary | ICD-10-CM | POA: Insufficient documentation

## 2017-08-18 DIAGNOSIS — I509 Heart failure, unspecified: Secondary | ICD-10-CM | POA: Insufficient documentation

## 2017-08-18 DIAGNOSIS — Z79899 Other long term (current) drug therapy: Secondary | ICD-10-CM | POA: Diagnosis not present

## 2017-08-18 DIAGNOSIS — E875 Hyperkalemia: Secondary | ICD-10-CM | POA: Diagnosis not present

## 2017-08-18 LAB — CBC WITH DIFFERENTIAL/PLATELET
Basophils Absolute: 0.1 10*3/uL (ref 0–0.1)
Basophils Relative: 1 %
EOS PCT: 4 %
Eosinophils Absolute: 0.3 10*3/uL (ref 0–0.7)
HCT: 31.2 % — ABNORMAL LOW (ref 40.0–52.0)
Hemoglobin: 9.6 g/dL — ABNORMAL LOW (ref 13.0–18.0)
LYMPHS ABS: 1 10*3/uL (ref 1.0–3.6)
LYMPHS PCT: 15 %
MCH: 24.6 pg — AB (ref 26.0–34.0)
MCHC: 30.7 g/dL — AB (ref 32.0–36.0)
MCV: 80.2 fL (ref 80.0–100.0)
MONO ABS: 0.9 10*3/uL (ref 0.2–1.0)
Monocytes Relative: 13 %
Neutro Abs: 4.3 10*3/uL (ref 1.4–6.5)
Neutrophils Relative %: 67 %
PLATELETS: 294 10*3/uL (ref 150–440)
RBC: 3.9 MIL/uL — ABNORMAL LOW (ref 4.40–5.90)
RDW: 19.2 % — AB (ref 11.5–14.5)
WBC: 6.4 10*3/uL (ref 3.8–10.6)

## 2017-08-18 LAB — COMPREHENSIVE METABOLIC PANEL
ALT: 27 U/L (ref 17–63)
ANION GAP: 12 (ref 5–15)
AST: 27 U/L (ref 15–41)
Albumin: 3 g/dL — ABNORMAL LOW (ref 3.5–5.0)
Alkaline Phosphatase: 188 U/L — ABNORMAL HIGH (ref 38–126)
BUN: 67 mg/dL — ABNORMAL HIGH (ref 6–20)
CHLORIDE: 104 mmol/L (ref 101–111)
CO2: 20 mmol/L — ABNORMAL LOW (ref 22–32)
Calcium: 9.7 mg/dL (ref 8.9–10.3)
Creatinine, Ser: 9.17 mg/dL — ABNORMAL HIGH (ref 0.61–1.24)
GFR, EST AFRICAN AMERICAN: 6 mL/min — AB (ref >60)
GFR, EST NON AFRICAN AMERICAN: 6 mL/min — AB (ref >60)
Glucose, Bld: 98 mg/dL (ref 65–99)
Sodium: 136 mmol/L (ref 135–145)
Total Bilirubin: 0.5 mg/dL (ref 0.3–1.2)
Total Protein: 7.3 g/dL (ref 6.5–8.1)

## 2017-08-18 MED ORDER — CHLORHEXIDINE GLUCONATE CLOTH 2 % EX PADS
6.0000 | MEDICATED_PAD | Freq: Every day | CUTANEOUS | Status: DC
  Administered 2017-08-18: 6 via TOPICAL

## 2017-08-18 MED ORDER — DIPHENHYDRAMINE HCL 50 MG/ML IJ SOLN
12.5000 mg | Freq: Once | INTRAMUSCULAR | Status: AC
  Administered 2017-08-18: 12.5 mg via INTRAVENOUS

## 2017-08-18 MED ORDER — EPOETIN ALFA 10000 UNIT/ML IJ SOLN
10000.0000 [IU] | Freq: Once | INTRAMUSCULAR | Status: AC
  Administered 2017-08-18: 10000 [IU] via INTRAVENOUS

## 2017-08-18 NOTE — ED Notes (Signed)
Attempted to obtain blood work x 3 unsuccessfully.

## 2017-08-18 NOTE — ED Notes (Signed)
Cbc and cmp not drawn due to diaylsis

## 2017-08-18 NOTE — ED Notes (Addendum)
First Nurse Note: Pt to ED stating that he is weak. Pt here for dialysis treatment as well. Pt states that he has missed last 2 dialysis treatments because they were wanting to set him up with a new clinic and they wanted to have a meeting with him prior to starting dialysis.   Pt is in NAD at this time.

## 2017-08-18 NOTE — ED Triage Notes (Signed)
Pt presents to ED for dialysis and increasing weakness. Pt states has missed last 2 dialysis appts. Pt normally ambulatory without assistance, states now he feels like he is barely able to walk.

## 2017-08-18 NOTE — ED Notes (Signed)
Pt to dialysis at this time via [place]

## 2017-08-18 NOTE — Progress Notes (Signed)
  [facility]  ROUNDING NOTE   Subjective:   Potassium >7.5  Did not want to go for meeting with [facility] yesterday.   Objective:  Vital signs in last 24 hours:  Temp:  [97.6 F (36.4 C)-98.2 F (36.8 C)] 98.2 F (36.8 C) (06/07 1440) Pulse Rate:  [71-83] 81 (06/07 1630) Resp:  [15-20] 17 (06/07 1630) BP: (100-135)/(65-88) 107/67 (06/07 1630) SpO2:  [100 %] 100 % (06/07 1440)  Weight change:  Filed Weights    Intake/Output: No intake/output data recorded.   Intake/Output this shift:  No intake/output data recorded.  Physical Exam: General: NAD  Head: Normocephalic, atraumatic. Moist oral mucosal membranes  Eyes: Anicteric  Neck: Supple  Lungs:  normal effort, mild bilateral rales  Heart: Regular rate and rhythm  Abdomen:  Soft, nontender, distended  Extremities: no peripheral edema.  Neurologic: Nonfocal, moving all four extremities  Skin: No lesions  Access: R IJ permcath    Basic Metabolic Panel: Recent Labs  Lab 08/18/17 1445  NA 136  K >7.5*  CL 104  CO2 20*  GLUCOSE 98  BUN 67*  CREATININE 9.17*  CALCIUM 9.7    Liver Function Tests: Recent Labs  Lab 08/18/17 1445  AST 27  ALT 27  ALKPHOS 188*  BILITOT 0.5  PROT 7.3  ALBUMIN 3.0*   No results for input(s): LIPASE, AMYLASE in the last 168 hours. No results for input(s): AMMONIA in the last 168 hours.  CBC: Recent Labs  Lab 08/18/17 1445  WBC 6.4  NEUTROABS 4.3  HGB 9.6*  HCT 31.2*  MCV 80.2  PLT 294    Cardiac Enzymes: No results for input(s): CKTOTAL, CKMB, CKMBINDEX, TROPONINI in the last 168 hours.  BNP: Invalid input(s): POCBNP  CBG: No results for input(s): GLUCAP in the last 168 hours.  Microbiology: Results for orders placed or performed during the hospital encounter of 07/12/17  Culture, blood (Routine X 2) w Reflex to ID Panel     Status: None   Collection Time: 07/12/17  4:43 PM  Result Value Ref Range Status   Specimen Description BLOOD LINE  CENTRAL  Final   Special Requests   Final    BOTTLES DRAWN AEROBIC AND ANAEROBIC Blood Culture adequate volume   Culture   Final    NO GROWTH 5 DAYS Performed at [facility], [address] [place], [place] [address]    Report Status 07/17/2017 FINAL  Final    Coagulation Studies: No results for input(s): LABPROT, INR in the last 72 hours.  Urinalysis: No results for input(s): COLORURINE, LABSPEC, PHURINE, GLUCOSEU, HGBUR, BILIRUBINUR, KETONESUR, PROTEINUR, UROBILINOGEN, NITRITE, LEUKOCYTESUR in the last 72 hours.  Invalid input(s): APPERANCEUR    Imaging: No results found.   Medications:       Assessment/ Plan:  [name] is a 57 y.o. black male withend stage renal disease on hemodialysis secondary to Alport's syndrome, ascites, hypertension, anemia of chronic kidney disease, coronary artery disease, peripheral vascular disease, hyperlipidemia, gastrointestinal AVMs, pulmonary hypertension  nooutpatientdialysis unit currently  1.  Hyperkalemia Hemodialysis with 1 K bath  2.  End-stage renal disease:  Hemodialysis today. Outpatient planning is ongoing.  Using CVC. Will need follow up with vascular for AVF.   3. Anemia with chronic kidney disease:  - Epogen 10,000 units IV with dialysis.  4. Hypertension:  - Continue home blood pressure medications.    LOS: 0 Stanley [name] 6/7/20194:45 PM

## 2017-08-18 NOTE — Care Management (Signed)
Stanley [name] dialysis liaison notified of admission.    

## 2017-08-18 NOTE — ED Notes (Signed)
Pt is here for dialysis - he had dialysis Monday per Dr Clearnce Hasten - Dr Clearnce Hasten is talking with dialysis physician to determine if labs need to be drawn due to patient missing dialysis Wednesday

## 2017-08-18 NOTE — ED Provider Notes (Signed)
[facility] Emergency Department Provider Note  ____________________________________________   First MD Initiated Contact with Patient 08/18/17 1225     (approximate)  I have reviewed the triage vital signs and the nursing notes.   HISTORY  Chief Complaint Weakness and Dialysis  HPI Tavarus Poteete is a 57 y.o. male with a history of end-stage renal disease on dialysis was presented to the emergency department today for dialysis.  Patient states that he was last dialyzed this past Monday.  Was scheduled to be dialyzed yesterday but said that he thought that the dialysis center that he was now going to be attending "did not want me."  He said that he thought he can be dialyzed but instead that he was supposed to go to a meeting.  He said he therefore did not go.  He is complaining of some bilateral lower extremely weakness today but says this is typical of when he misses dialysis sessions.  Appears to have been last dialyzed this past Monday.   Past Medical History:  Diagnosis Date  . Alcohol abuse   . CHF (congestive heart failure) ([place])   . Cirrhosis ([place])   . Coronary artery disease 2009  . Drug abuse ([place])   . End stage renal disease on dialysis [facility]) NEPHROLOGIST-   DR [facility]  IN [place]   HEMODIALYSIS --   TUES/  THURS/  SAT  . Gastrointestinal bleed 06/13/2017   From chart...hx of multiple GI bleeds  . GERD (gastroesophageal reflux disease)   . Hyperlipidemia   . Hypertension   . PAD (peripheral artery disease) ([place])   . Renal insufficiency    Per pt, 32 oz fluid restriction per day  . S/P triple vessel bypass 06/09/2016   2009ish  . Suicidal ideation    & HOMICIDAL IDEATION --  06-16-2013   ADMITTED TO BEHAVIOR HEALTH    Patient Active Problem List   Diagnosis Date Noted  . ESRD (end stage renal disease) on dialysis ([place]) 07/28/2017  . Protein-calorie malnutrition, severe 06/14/2017  . Encounter for dialysis [facility])   . Palliative  care by specialist   . Goals of care, counseling/discussion   . Malnutrition of moderate degree 06/05/2017  . Secondary esophageal varices without bleeding ([place])   . Stomach irritation   . Idiopathic esophageal varices without bleeding ([place])   . Alcoholic hepatitis with ascites 05/24/2017  . ESRD (end stage renal disease) ([place]) 04/28/2017  . Uremia 03/08/2017  . ESRD on hemodialysis ([place]) 03/03/2017  . Weakness 02/28/2017  . Hypocalcemia 02/22/2017  . Shortness of breath 11/26/2016  . COPD (chronic obstructive pulmonary disease) ([place]) 10/30/2016  . COPD exacerbation ([place]) 10/29/2016  . Anemia   . Heme positive stool   . Ulceration of intestine   . Benign neoplasm of transverse colon   . Acute gastrointestinal hemorrhage   . Esophageal candidiasis ([place])   . Angiodysplasia of intestinal tract   . Acute respiratory failure with hypoxia ([place]) 07/03/2016  . GI bleeding 06/24/2016  . Rectal bleeding 06/14/2016  . Anemia of chronic disease 06/01/2016  . MRSA carrier 06/01/2016  . Chronic renal failure 05/23/2016  . Ischemic heart disease 05/23/2016  . Angiodysplasia of small intestine   . Melena   . Small bowel bleed not requiring more than 4 units of blood in 24 hours, ICU, or surgery   . Anemia due to chronic blood loss   . Abdominal pain 05/05/2016  . Acute posthemorrhagic anemia 04/17/2016  . Gastrointestinal bleed 04/17/2016  .  History of esophagogastroduodenoscopy (EGD) 04/17/2016  . Elevated troponin 04/17/2016  . Alcohol abuse 04/17/2016  . Upper GI bleed 01/19/2016  . Blood in stool   . Angiodysplasia of stomach and duodenum with hemorrhage   . Gastritis   . Reflux esophagitis   . GI bleed 05/16/2015  . Acute GI bleeding   . Symptomatic anemia 04/30/2015  . HTN (hypertension) 04/06/2015  . GERD (gastroesophageal reflux disease) 04/06/2015  . HLD (hyperlipidemia) 04/06/2015  . Dyspnea 04/06/2015  . Cirrhosis of liver with ascites ([place]) 04/06/2015  . Ascites  04/06/2015  . GIB (gastrointestinal bleeding) 03/23/2015  . Homicidal ideation 06/19/2013  . Suicidal intent 06/19/2013  . Homicidal ideations 06/19/2013  . Hyperkalemia 06/16/2013  . Mandible fracture ([place]) 06/05/2013  . Fracture, mandible ([place]) 06/02/2013  . Coronary atherosclerosis of native coronary artery 06/02/2013  . ESRD on dialysis ([place]) 06/02/2013  . Mandible open fracture ([place]) 06/02/2013    Past Surgical History:  Procedure Laterality Date  . A/V FISTULAGRAM Right 06/06/2017   Procedure: A/V FISTULAGRAM;  Surgeon: Katha Cabal, MD;  Location: [place] CV LAB;  Service: Cardiovascular;  Laterality: Right;  . A/V SHUNT INTERVENTION N/A 06/06/2017   Procedure: A/V SHUNT INTERVENTION;  Surgeon: Katha Cabal, MD;  Location: [place] CV LAB;  Service: Cardiovascular;  Laterality: N/A;  . AGILE CAPSULE N/A 06/19/2016   Procedure: AGILE CAPSULE;  Surgeon: [name], MD;  Location: [facility] ENDOSCOPY;  Service: Endoscopy;  Laterality: N/A;  . COLONOSCOPY WITH PROPOFOL N/A 06/18/2016   Procedure: COLONOSCOPY WITH PROPOFOL;  Surgeon: [name], MD;  Location: [facility] ENDOSCOPY;  Service: Endoscopy;  Laterality: N/A;  . COLONOSCOPY WITH PROPOFOL N/A 08/12/2016   Procedure: COLONOSCOPY WITH PROPOFOL;  Surgeon: [name], MD;  Location: [facility] ENDOSCOPY;  Service: Endoscopy;  Laterality: N/A;  . COLONOSCOPY WITH PROPOFOL N/A 05/05/2017   Procedure: COLONOSCOPY WITH PROPOFOL;  Surgeon: Manya Silvas, MD;  Location: [facility] ENDOSCOPY;  Service: Endoscopy;  Laterality: N/A;  . CORONARY ANGIOPLASTY  ?   PT UNABLE TO TELL IF  BEFORE OR AFTER  CABG  . CORONARY ARTERY BYPASS GRAFT  2008  ([place])   3 VESSEL  . DIALYSIS FISTULA CREATION  LAST SURGERY  APPOX  2008  . ENTEROSCOPY N/A 05/10/2016   Procedure: ENTEROSCOPY;  Surgeon: Jerene Bears, MD;  Location: [place];  Service: Gastroenterology;  Laterality: N/A;  . ENTEROSCOPY N/A 08/12/2016   Procedure: ENTEROSCOPY;   Surgeon: [name], MD;  Location: [facility] ENDOSCOPY;  Service: Endoscopy;  Laterality: N/A;  . ENTEROSCOPY Left 06/03/2017   Procedure: ENTEROSCOPY;  Surgeon: Virgel Manifold, MD;  Location: [facility] ENDOSCOPY;  Service: Endoscopy;  Laterality: Left;  Procedure date will ultimately depend on when patient is medically optimized before the procedure, pending hemodialysis and blood transfusions etc. Will place on schedule and change depending on clinical status.   . ENTEROSCOPY N/A 06/05/2017   Procedure: ENTEROSCOPY;  Surgeon: Virgel Manifold, MD;  Location: [facility] ENDOSCOPY;  Service: Endoscopy;  Laterality: N/A;  . ENTEROSCOPY N/A 06/15/2017   Procedure: Push ENTEROSCOPY;  Surgeon: [name], MD;  Location: [facility] ENDOSCOPY;  Service: Endoscopy;  Laterality: N/A;  . ESOPHAGOGASTRODUODENOSCOPY N/A 05/07/2015   Procedure: ESOPHAGOGASTRODUODENOSCOPY (EGD);  Surgeon: Hulen Luster, MD;  Location: [facility] ENDOSCOPY;  Service: Endoscopy;  Laterality: N/A;  . ESOPHAGOGASTRODUODENOSCOPY (EGD) WITH PROPOFOL N/A 05/17/2015   Procedure: ESOPHAGOGASTRODUODENOSCOPY (EGD) WITH PROPOFOL;  Surgeon: [name], MD;  Location: [facility] ENDOSCOPY;  Service: Endoscopy;  Laterality: N/A;  .  ESOPHAGOGASTRODUODENOSCOPY (EGD) WITH PROPOFOL N/A 01/20/2016   Procedure: ESOPHAGOGASTRODUODENOSCOPY (EGD) WITH PROPOFOL;  Surgeon: [name], MD;  Location: [facility] ENDOSCOPY;  Service: Endoscopy;  Laterality: N/A;  . ESOPHAGOGASTRODUODENOSCOPY (EGD) WITH PROPOFOL N/A 04/17/2016   Procedure: ESOPHAGOGASTRODUODENOSCOPY (EGD) WITH PROPOFOL;  Surgeon: Lin Landsman, MD;  Location: [facility] ENDOSCOPY;  Service: Gastroenterology;  Laterality: N/A;  . ESOPHAGOGASTRODUODENOSCOPY (EGD) WITH PROPOFOL  05/09/2016   Procedure: ESOPHAGOGASTRODUODENOSCOPY (EGD) WITH PROPOFOL;  Surgeon: Jerene Bears, MD;  Location: [place];  Service: Endoscopy;;  . ESOPHAGOGASTRODUODENOSCOPY (EGD) WITH PROPOFOL N/A 06/16/2016   Procedure: ESOPHAGOGASTRODUODENOSCOPY (EGD)  WITH PROPOFOL;  Surgeon: [name], MD;  Location: [facility] ENDOSCOPY;  Service: Endoscopy;  Laterality: N/A;  . ESOPHAGOGASTRODUODENOSCOPY (EGD) WITH PROPOFOL N/A 05/05/2017   Procedure: ESOPHAGOGASTRODUODENOSCOPY (EGD) WITH PROPOFOL;  Surgeon: Manya Silvas, MD;  Location[facility] ENDOSCOPY;  Service: Endoscopy;  Laterality: N/A;  . ESOPHAGOGASTRODUODENOSCOPY (EGD) WITH PROPOFOL N/A 06/15/2017   Procedure: ESOPHAGOGASTRODUODENOSCOPY (EGD) WITH PROPOFOL;  Surgeon: [name], MD;  Location: [facility] ENDOSCOPY;  Service: Endoscopy;  Laterality: N/A;  . GIVENS CAPSULE STUDY N/A 05/07/2016   Procedure: GIVENS CAPSULE STUDY;  Surgeon: Doran Stabler, MD;  Location: [place];  Service: Endoscopy;  Laterality: N/A;  . MANDIBULAR HARDWARE REMOVAL N/A 07/29/2013   Procedure: REMOVAL OF ARCH BARS;  Surgeon: Theodoro Kos, DO;  Location: [place];  Service: Plastics;  Laterality: N/A;  . ORIF MANDIBULAR FRACTURE N/A 06/05/2013   Procedure: REPAIR OF MANDIBULAR FRACTURE x 2 with maxillo-mandibular fixation ;  Surgeon: Theodoro Kos, DO;  Location: [place];  Service: Plastics;  Laterality: N/A;  . PERIPHERAL ARTERIAL STENT GRAFT Left     Prior to Admission medications   Medication Sig Start Date End Date Taking? Authorizing Provider  albuterol (PROVENTIL HFA;VENTOLIN HFA) 108 (90 Base) MCG/ACT inhaler Inhale 2 puffs into the lungs every 6 (six) hours as needed for wheezing or shortness of breath. 04/21/17   Lisa Roca, MD  alum & mag hydroxide-simeth (MAALOX/MYLANTA) 200-200-20 MG/5ML suspension Take 15 mLs every 4 (four) hours as needed by mouth for indigestion or heartburn. Patient not taking: Reported on 04/19/2017 01/25/17   Demetrios Loll, MD  atorvastatin (LIPITOR) 40 MG tablet Take 1 tablet (40 mg total) by mouth daily. 11/16/16   Vaughan Basta, MD  budesonide-formoterol (SYMBICORT) 160-4.5 MCG/ACT inhaler Inhale 2 puffs into the lungs daily. 11/16/16   Vaughan Basta, MD    calcium acetate (PHOSLO) 667 MG capsule Take 2,001 mg by mouth 3 (three) times daily.    [provider]  doxycycline (VIBRA-TABS) 100 MG tablet Take 1 tablet (100 mg total) by mouth 2 (two) times daily. 07/10/17   Harvest Dark, MD  ferrous sulfate 325 (65 FE) MG tablet Take 1 tablet by mouth 3 (three) times daily. 01/21/17   [provider]  furosemide (LASIX) 80 MG tablet Take 1 tablet (80 mg total) daily by mouth. 01/25/17   Demetrios Loll, MD  gabapentin (NEURONTIN) 300 MG capsule Take 1 capsule by mouth daily as directed 08/16/16   [provider]  ipratropium-albuterol (DUONEB) 0.5-2.5 (3) MG/3ML SOLN Take 3 mLs by nebulization every 6 (six) hours as needed. 11/28/16   Loletha Grayer, MD  labetalol (NORMODYNE) 100 MG tablet Take 100 mg by mouth daily. 03/24/17   [provider]  Multiple Vitamins-Minerals-FA (DIALYVITE SUPREME D) 3 MG TABS Take 1 tablet (3 mg total) by mouth daily. 11/28/16   Loletha Grayer, MD  nitroGLYCERIN (NITROSTAT) 0.4 MG SL tablet Place 1 tablet (0.4  mg total) under the tongue every 5 (five) minutes as needed. 04/12/16   Wende Bushy, MD  pantoprazole (PROTONIX) 40 MG tablet Take 1 tablet (40 mg total) by mouth daily. 11/28/16   Loletha Grayer, MD  Spacer/Aero Chamber Mouthpiece MISC 1 Units by Does not apply route every 4 (four) hours as needed (wheezing). 02/19/17   Darel Hong, MD  tiotropium (SPIRIVA HANDIHALER) 18 MCG inhalation capsule Place 1 capsule (18 mcg total) into inhaler and inhale daily. 11/16/16 11/16/17  Vaughan Basta, MD    Allergies Patient has no known allergies.  Family History  Problem Relation Age of Onset  . Colon cancer Mother   . Cancer Father   . Cancer Sister   . Kidney disease Brother     Social History Social History   Tobacco Use  . Smoking status: Current Every Day Smoker    Packs/day: 0.15    Years: 40.00    Pack years: 6.00    Types: Cigarettes  . Smokeless tobacco: Never  Used  Substance Use Topics  . Alcohol use: No    Comment: pt reports quitting after learning about cirrhosis  . Drug use: No    Frequency: 7.0 times per week    Types: Marijuana, Cocaine    Review of Systems  Constitutional: No fever/chills Eyes: No visual changes. ENT: No sore throat. Cardiovascular: Denies chest pain. Respiratory: Denies shortness of breath. Gastrointestinal: No abdominal pain.  No nausea, no vomiting.  No diarrhea.  No constipation. Genitourinary: Negative for dysuria. Musculoskeletal: Negative for back pain. Skin: Negative for rash. Neurological: Negative for headaches or numbness.   ____________________________________________   PHYSICAL EXAM:  VITAL SIGNS: ED Triage Vitals  Enc Vitals Group     BP 08/18/17 1158 129/79     Pulse Rate 08/18/17 1158 71     Resp 08/18/17 1158 20     Temp 08/18/17 1158 97.6 F (36.4 C)     Temp Source 08/18/17 1158 Oral     SpO2 08/18/17 1158 100 %     Weight --      Height 08/18/17 1202 6\' 3"  (1.905 m)     Head Circumference --      Peak Flow --      Pain Score 08/18/17 1202 0     Pain Loc --      Pain Edu? --      Excl. in [place]? --     Constitutional: Alert and oriented. Well appearing and in no acute distress. Eyes: Conjunctivae are normal.  Head: Atraumatic. Nose: No congestion/rhinnorhea. Mouth/Throat: Mucous membranes are moist.  Neck: No stridor.   Cardiovascular: Normal rate, regular rhythm. Grossly normal heart sounds.   Respiratory: Normal respiratory effort.  No retractions. Lungs CTAB. Gastrointestinal: Soft and nontender. No distention. No CVA tenderness. Musculoskeletal: No lower extremity tenderness nor edema.  No joint effusions. Neurologic:  Normal speech and language. No gross focal neurologic deficits are appreciated. Skin:  Skin is warm, dry and intact. No rash noted. Psychiatric: Mood and affect are normal. Speech and behavior are normal.  ____________________________________________    LABS (all labs ordered are listed, but only abnormal results are displayed)  Labs Reviewed  CBC WITH DIFFERENTIAL/PLATELET  COMPREHENSIVE METABOLIC PANEL   ____________________________________________  EKG   ____________________________________________  RADIOLOGY   ____________________________________________   PROCEDURES  Procedure(s) performed:   Procedures  Critical Care performed:   ____________________________________________   INITIAL IMPRESSION / ASSESSMENT AND PLAN / ED COURSE  Pertinent labs & imaging results that were  available during my care of the patient were reviewed by me and considered in my medical decision making (see chart for details).  DDX: Dialysis noncompliance, mitral abnormality, weakness As part of my medical decision making, I reviewed the following data within the [place] Notes from prior ED visits  ----------------------------------------- 12:59 PM on 08/18/2017 -----------------------------------------  Discussed case with Dr. Juleen China who agrees to dialyze the patient today.  He will also discussed for the follow-up with the patient for his outpatient dialysis. ____________________________________________   FINAL CLINICAL IMPRESSION(S) / ED DIAGNOSES  Presentation for hemodialysis.  NEW MEDICATIONS STARTED DURING THIS VISIT:  New Prescriptions   No medications on file     Note:  This document was prepared using Dragon voice recognition software and may include unintentional dictation errors.     Orbie Pyo, MD 08/18/17 1300

## 2017-08-18 NOTE — Progress Notes (Signed)
Hd completed 

## 2017-08-18 NOTE — Progress Notes (Signed)
Hd started  

## 2017-08-21 ENCOUNTER — Other Ambulatory Visit: Payer: Self-pay

## 2017-08-21 ENCOUNTER — Encounter: Payer: Self-pay | Admitting: Emergency Medicine

## 2017-08-21 ENCOUNTER — Non-Acute Institutional Stay
Admission: EM | Admit: 2017-08-21 | Discharge: 2017-08-21 | Disposition: A | Discharge status: Home / Self Care | Payer: Medicare Other | Attending: Emergency Medicine | Admitting: Emergency Medicine

## 2017-08-21 DIAGNOSIS — F17210 Nicotine dependence, cigarettes, uncomplicated: Secondary | ICD-10-CM | POA: Insufficient documentation

## 2017-08-21 DIAGNOSIS — K7460 Unspecified cirrhosis of liver: Secondary | ICD-10-CM | POA: Insufficient documentation

## 2017-08-21 DIAGNOSIS — J449 Chronic obstructive pulmonary disease, unspecified: Secondary | ICD-10-CM | POA: Diagnosis not present

## 2017-08-21 DIAGNOSIS — N186 End stage renal disease: Secondary | ICD-10-CM | POA: Insufficient documentation

## 2017-08-21 DIAGNOSIS — I2510 Atherosclerotic heart disease of native coronary artery without angina pectoris: Secondary | ICD-10-CM | POA: Insufficient documentation

## 2017-08-21 DIAGNOSIS — E785 Hyperlipidemia, unspecified: Secondary | ICD-10-CM | POA: Insufficient documentation

## 2017-08-21 DIAGNOSIS — D631 Anemia in chronic kidney disease: Secondary | ICD-10-CM | POA: Insufficient documentation

## 2017-08-21 DIAGNOSIS — I509 Heart failure, unspecified: Secondary | ICD-10-CM | POA: Insufficient documentation

## 2017-08-21 DIAGNOSIS — E875 Hyperkalemia: Secondary | ICD-10-CM | POA: Insufficient documentation

## 2017-08-21 DIAGNOSIS — K210 Gastro-esophageal reflux disease with esophagitis: Secondary | ICD-10-CM | POA: Insufficient documentation

## 2017-08-21 DIAGNOSIS — R188 Other ascites: Secondary | ICD-10-CM | POA: Diagnosis not present

## 2017-08-21 DIAGNOSIS — I739 Peripheral vascular disease, unspecified: Secondary | ICD-10-CM | POA: Diagnosis not present

## 2017-08-21 DIAGNOSIS — Z992 Dependence on renal dialysis: Secondary | ICD-10-CM | POA: Insufficient documentation

## 2017-08-21 DIAGNOSIS — Z79899 Other long term (current) drug therapy: Secondary | ICD-10-CM | POA: Insufficient documentation

## 2017-08-21 DIAGNOSIS — I132 Hypertensive heart and chronic kidney disease with heart failure and with stage 5 chronic kidney disease, or end stage renal disease: Secondary | ICD-10-CM | POA: Insufficient documentation

## 2017-08-21 DIAGNOSIS — Z951 Presence of aortocoronary bypass graft: Secondary | ICD-10-CM | POA: Insufficient documentation

## 2017-08-21 DIAGNOSIS — I120 Hypertensive chronic kidney disease with stage 5 chronic kidney disease or end stage renal disease: Secondary | ICD-10-CM | POA: Diagnosis not present

## 2017-08-21 DIAGNOSIS — J810 Acute pulmonary edema: Secondary | ICD-10-CM | POA: Diagnosis not present

## 2017-08-21 DIAGNOSIS — A419 Sepsis, unspecified organism: Secondary | ICD-10-CM | POA: Diagnosis not present

## 2017-08-21 LAB — RENAL FUNCTION PANEL
ALBUMIN: 2.8 g/dL — AB (ref 3.5–5.0)
ANION GAP: 11 (ref 5–15)
BUN: 51 mg/dL — AB (ref 6–20)
CALCIUM: 8.6 mg/dL — AB (ref 8.9–10.3)
CO2: 23 mmol/L (ref 22–32)
Chloride: 103 mmol/L (ref 101–111)
Creatinine, Ser: 9.47 mg/dL — ABNORMAL HIGH (ref 0.61–1.24)
GFR calc Af Amer: 6 mL/min — ABNORMAL LOW (ref >60)
GFR calc non Af Amer: 5 mL/min — ABNORMAL LOW (ref >60)
GLUCOSE: 84 mg/dL (ref 65–99)
POTASSIUM: 7 mmol/L — AB (ref 3.5–5.1)
Phosphorus: 4.7 mg/dL — ABNORMAL HIGH (ref 2.5–4.6)
SODIUM: 137 mmol/L (ref 135–145)

## 2017-08-21 LAB — CBC
HEMATOCRIT: 31.6 % — AB (ref 40.0–52.0)
HEMOGLOBIN: 9.3 g/dL — AB (ref 13.0–18.0)
MCH: 24 pg — ABNORMAL LOW (ref 26.0–34.0)
MCHC: 29.3 g/dL — AB (ref 32.0–36.0)
MCV: 81.8 fL (ref 80.0–100.0)
Platelets: 269 10*3/uL (ref 150–440)
RBC: 3.86 MIL/uL — ABNORMAL LOW (ref 4.40–5.90)
RDW: 19.4 % — AB (ref 11.5–14.5)
WBC: 6 10*3/uL (ref 3.8–10.6)

## 2017-08-21 MED ORDER — CHLORHEXIDINE GLUCONATE CLOTH 2 % EX PADS: 6.0000 | MEDICATED_PAD | Freq: Every day | CUTANEOUS | Status: DC

## 2017-08-21 MED ORDER — DIPHENHYDRAMINE HCL 50 MG/ML IJ SOLN
25.0000 mg | Freq: Once | INTRAMUSCULAR | Status: AC
  Administered 2017-08-21: 25 mg via INTRAVENOUS

## 2017-08-21 NOTE — Progress Notes (Signed)
Post HD assessment    08/21/17 1733  Neurological  Level of Consciousness Alert  Orientation Level Oriented X4  Respiratory  Respiratory Pattern Regular;Unlabored  Chest Assessment Chest expansion symmetrical  Cough Non-productive  Cardiac  ECG Monitor Yes  Vascular  R Radial Pulse +2  L Radial Pulse +2  Edema Generalized  Integumentary  Integumentary (WDL) X  Skin Color Appropriate for ethnicity  Musculoskeletal  Musculoskeletal (WDL) X  Generalized Weakness Yes  Assistive Device None  GU Assessment  Genitourinary (WDL) X  Genitourinary Symptoms  (HD)  Psychosocial  Psychosocial (WDL) WDL

## 2017-08-21 NOTE — Progress Notes (Signed)
HD tx start    08/21/17 1440  Vital Signs  Pulse Rate 79  Pulse Rate Source Monitor  Resp (!) 22  BP 129/90  BP Location Left Arm  BP Method Automatic  Patient Position (if appropriate) Lying  Oxygen Therapy  SpO2 100 %  O2 Device Room Air  During Hemodialysis Assessment  Blood Flow Rate (mL/min) 400 mL/min  Arterial Pressure (mmHg) -150 mmHg  Venous Pressure (mmHg) 170 mmHg  Transmembrane Pressure (mmHg) 70 mmHg  Ultrafiltration Rate (mL/min) 1290 mL/min  Dialysate Flow Rate (mL/min) 800 ml/min  Conductivity: Machine  14.2  HD Safety Checks Performed Yes  Dialysis Fluid Bolus Normal Saline  Bolus Amount (mL) 250 mL  Intra-Hemodialysis Comments Tx initiated  Hemodialysis Catheter Right Internal jugular Double-lumen  No Placement Date or Time found.   Placed prior to admission: Yes  Orientation: Right  Access Location: Internal jugular  Hemodialysis Catheter Type: Double-lumen  Blue Lumen Status Infusing  Red Lumen Status Infusing

## 2017-08-21 NOTE — Progress Notes (Signed)
Pre HD assessment   08/21/17 1436  Vital Signs  Temp 97.8 F (36.6 C)  Temp Source Oral  Pulse Rate 77  Pulse Rate Source Monitor  Resp 16  BP 126/85  BP Location Left Arm  BP Method Automatic  Patient Position (if appropriate) Lying  Oxygen Therapy  SpO2 100 %  O2 Device Room Air  Pain Assessment  Pain Scale 0-10  Pain Score 0  Dialysis Weight  Weight 74.8 kg (164 lb 14.5 oz)  Type of Weight Pre-Dialysis  Time-Out for Hemodialysis  What Procedure? HD  Pt Identifiers(min of two) First/Last Name;MRN/Account#  Correct Site? Yes  Correct Side? Yes  Correct Procedure? Yes  Consents Verified? Yes  Rad Studies Available? N/A  Safety Precautions Reviewed? Yes  Engineer, civil (consulting) Number  ([id number])  Station Number 3  UF/Alarm Test Passed  Conductivity: Meter 14  Conductivity: Machine  14.3  pH 7.4  Reverse Osmosis main  Normal Saline Lot Number [id number]  Dialyzer Lot Number [id number]  Disposable Set Lot Number [id number]  Machine Temperature 98.6 F (37 C)  Musician and Audible Yes  Blood Lines Intact and Secured Yes  Pre Treatment Patient Checks  Vascular access used during treatment Catheter  Hepatitis B Surface Antigen Results Negative  Date Hepatitis B Surface Antigen Drawn 06/16/17  Hepatitis B Surface Antibody  (>10)  Date Hepatitis B Surface Antibody Drawn 06/16/17  Hemodialysis Consent Verified Yes  Hemodialysis Standing Orders Initiated Yes  ECG (Telemetry) Monitor On Yes  Prime Ordered Normal Saline  Length of  DialysisTreatment -hour(s) 3.5 Hour(s)  Dialyzer Elisio 17H NR  Dialysate 2K, 2.5 Ca  Dialysis Anticoagulant None  Dialysate Flow Ordered 800  Blood Flow Rate Ordered 400 mL/min  Ultrafiltration Goal 4 Liters  Pre Treatment Labs CBC;Renal panel  Dialysis Blood Pressure Support Ordered Normal Saline  Education / Care Plan  Dialysis Education Provided Yes  Documented Education in Care Plan Yes  Hemodialysis Catheter Right Internal jugular  Double-lumen  No Placement Date or Time found.   Placed prior to admission: Yes  Orientation: Right  Access Location: Internal jugular  Hemodialysis Catheter Type: Double-lumen  Site Condition No complications  Blue Lumen Status Heparin locked  Red Lumen Status Heparin locked  Purple Lumen Status N/A  Dressing Type Biopatch  Dressing Status Clean;Dry;Intact  Drainage Description None  Dressing Change Due 08/25/17

## 2017-08-21 NOTE — ED Provider Notes (Addendum)
[facility] Emergency Department Provider Note  ____________________________________________   I have reviewed the triage vital signs and the nursing notes. Where available I have reviewed prior notes and, if possible and indicated, outside hospital notes.    HISTORY  Chief Complaint No chief complaint on file.    HPI Stanley [name] is a 57 y.o. male who presents today complaining of needing dialysis because for some reason patient has to come here for dialysis 3 times a week.  He was here on Friday did not miss and no complaints of chest pain shortness breath or fever    Past Medical History:  Diagnosis Date  . Alcohol abuse   . CHF (congestive heart failure) ([place])   . Cirrhosis ([place])   . Coronary artery disease 2009  . Drug abuse ([place])   . End stage renal disease on dialysis [facility]) NEPHROLOGIST-   DR [facility]  IN [place]   HEMODIALYSIS --   TUES/  THURS/  SAT  . Gastrointestinal bleed 06/13/2017   From chart...hx of multiple GI bleeds  . GERD (gastroesophageal reflux disease)   . Hyperlipidemia   . Hypertension   . PAD (peripheral artery disease) ([place])   . Renal insufficiency    Per pt, 32 oz fluid restriction per day  . S/P triple vessel bypass 06/09/2016   2009ish  . Suicidal ideation    & HOMICIDAL IDEATION --  06-16-2013   ADMITTED TO BEHAVIOR HEALTH    Patient Active Problem List   Diagnosis Date Noted  . ESRD (end stage renal disease) on dialysis ([place]) 07/28/2017  . Protein-calorie malnutrition, severe 06/14/2017  . Encounter for dialysis [facility])   . Palliative care by specialist   . Goals of care, counseling/discussion   . Malnutrition of moderate degree 06/05/2017  . Secondary esophageal varices without bleeding ([place])   . Stomach irritation   . Idiopathic esophageal varices without bleeding ([place])   . Alcoholic hepatitis with ascites 05/24/2017  . ESRD (end stage renal disease) ([place]) 04/28/2017  . Uremia 03/08/2017  . ESRD on  hemodialysis ([place]) 03/03/2017  . Weakness 02/28/2017  . Hypocalcemia 02/22/2017  . Shortness of breath 11/26/2016  . COPD (chronic obstructive pulmonary disease) ([place]) 10/30/2016  . COPD exacerbation ([place]) 10/29/2016  . Anemia   . Heme positive stool   . Ulceration of intestine   . Benign neoplasm of transverse colon   . Acute gastrointestinal hemorrhage   . Esophageal candidiasis ([place])   . Angiodysplasia of intestinal tract   . Acute respiratory failure with hypoxia ([place]) 07/03/2016  . GI bleeding 06/24/2016  . Rectal bleeding 06/14/2016  . Anemia of chronic disease 06/01/2016  . MRSA carrier 06/01/2016  . Chronic renal failure 05/23/2016  . Ischemic heart disease 05/23/2016  . Angiodysplasia of small intestine   . Melena   . Small bowel bleed not requiring more than 4 units of blood in 24 hours, ICU, or surgery   . Anemia due to chronic blood loss   . Abdominal pain 05/05/2016  . Acute posthemorrhagic anemia 04/17/2016  . Gastrointestinal bleed 04/17/2016  . History of esophagogastroduodenoscopy (EGD) 04/17/2016  . Elevated troponin 04/17/2016  . Alcohol abuse 04/17/2016  . Upper GI bleed 01/19/2016  . Blood in stool   . Angiodysplasia of stomach and duodenum with hemorrhage   . Gastritis   . Reflux esophagitis   . GI bleed 05/16/2015  . Acute GI bleeding   . Symptomatic anemia 04/30/2015  . HTN (hypertension) 04/06/2015  .  GERD (gastroesophageal reflux disease) 04/06/2015  . HLD (hyperlipidemia) 04/06/2015  . Dyspnea 04/06/2015  . Cirrhosis of liver with ascites ([place]) 04/06/2015  . Ascites 04/06/2015  . GIB (gastrointestinal bleeding) 03/23/2015  . Homicidal ideation 06/19/2013  . Suicidal intent 06/19/2013  . Homicidal ideations 06/19/2013  . Hyperkalemia 06/16/2013  . Mandible fracture ([place]) 06/05/2013  . Fracture, mandible ([place]) 06/02/2013  . Coronary atherosclerosis of native coronary artery 06/02/2013  . ESRD on dialysis ([place]) 06/02/2013  . Mandible open  fracture ([place]) 06/02/2013    Past Surgical History:  Procedure Laterality Date  . A/V FISTULAGRAM Right 06/06/2017   Procedure: A/V FISTULAGRAM;  Surgeon: Katha Cabal, MD;  Location: [place] CV LAB;  Service: Cardiovascular;  Laterality: Right;  . A/V SHUNT INTERVENTION N/A 06/06/2017   Procedure: A/V SHUNT INTERVENTION;  Surgeon: Katha Cabal, MD;  Location: [place] CV LAB;  Service: Cardiovascular;  Laterality: N/A;  . AGILE CAPSULE N/A 06/19/2016   Procedure: AGILE CAPSULE;  Surgeon: [name], MD;  Location: [facility] ENDOSCOPY;  Service: Endoscopy;  Laterality: N/A;  . COLONOSCOPY WITH PROPOFOL N/A 06/18/2016   Procedure: COLONOSCOPY WITH PROPOFOL;  Surgeon: [name], MD;  Location: [facility] ENDOSCOPY;  Service: Endoscopy;  Laterality: N/A;  . COLONOSCOPY WITH PROPOFOL N/A 08/12/2016   Procedure: COLONOSCOPY WITH PROPOFOL;  Surgeon: [name], MD;  Location[facility] ENDOSCOPY;  Service: Endoscopy;  Laterality: N/A;  . COLONOSCOPY WITH PROPOFOL N/A 05/05/2017   Procedure: COLONOSCOPY WITH PROPOFOL;  Surgeon: Manya Silvas, MD;  Location: [facility] ENDOSCOPY;  Service: Endoscopy;  Laterality: N/A;  . CORONARY ANGIOPLASTY  ?   PT UNABLE TO TELL IF  BEFORE OR AFTER  CABG  . CORONARY ARTERY BYPASS GRAFT  2008  ([place])   3 VESSEL  . DIALYSIS FISTULA CREATION  LAST SURGERY  APPOX  2008  . ENTEROSCOPY N/A 05/10/2016   Procedure: ENTEROSCOPY;  Surgeon: Jerene Bears, MD;  Location: [place];  Service: Gastroenterology;  Laterality: N/A;  . ENTEROSCOPY N/A 08/12/2016   Procedure: ENTEROSCOPY;  Surgeon: [name], MD;  Location: [facility] ENDOSCOPY;  Service: Endoscopy;  Laterality: N/A;  . ENTEROSCOPY Left 06/03/2017   Procedure: ENTEROSCOPY;  Surgeon: Virgel Manifold, MD;  Location: [facility] ENDOSCOPY;  Service: Endoscopy;  Laterality: Left;  Procedure date will ultimately depend on when patient is medically optimized before the procedure, pending hemodialysis and blood  transfusions etc. Will place on schedule and change depending on clinical status.   . ENTEROSCOPY N/A 06/05/2017   Procedure: ENTEROSCOPY;  Surgeon: Virgel Manifold, MD;  Location: [facility] ENDOSCOPY;  Service: Endoscopy;  Laterality: N/A;  . ENTEROSCOPY N/A 06/15/2017   Procedure: Push ENTEROSCOPY;  Surgeon: [name], MD;  Location[facility] ENDOSCOPY;  Service: Endoscopy;  Laterality: N/A;  . ESOPHAGOGASTRODUODENOSCOPY N/A 05/07/2015   Procedure: ESOPHAGOGASTRODUODENOSCOPY (EGD);  Surgeon: Hulen Luster, MD;  Location: [facility] ENDOSCOPY;  Service: Endoscopy;  Laterality: N/A;  . ESOPHAGOGASTRODUODENOSCOPY (EGD) WITH PROPOFOL N/A 05/17/2015   Procedure: ESOPHAGOGASTRODUODENOSCOPY (EGD) WITH PROPOFOL;  Surgeon: [name], MD;  Location: [facility] ENDOSCOPY;  Service: Endoscopy;  Laterality: N/A;  . ESOPHAGOGASTRODUODENOSCOPY (EGD) WITH PROPOFOL N/A 01/20/2016   Procedure: ESOPHAGOGASTRODUODENOSCOPY (EGD) WITH PROPOFOL;  Surgeon: [name], MD;  Location: [facility] ENDOSCOPY;  Service: Endoscopy;  Laterality: N/A;  . ESOPHAGOGASTRODUODENOSCOPY (EGD) WITH PROPOFOL N/A 04/17/2016   Procedure: ESOPHAGOGASTRODUODENOSCOPY (EGD) WITH PROPOFOL;  Surgeon: Lin Landsman, MD;  Location: [facility] ENDOSCOPY;  Service: Gastroenterology;  Laterality: N/A;  . ESOPHAGOGASTRODUODENOSCOPY (EGD) WITH PROPOFOL  05/09/2016  Procedure: ESOPHAGOGASTRODUODENOSCOPY (EGD) WITH PROPOFOL;  Surgeon: Jerene Bears, MD;  Location: [place];  Service: Endoscopy;;  . ESOPHAGOGASTRODUODENOSCOPY (EGD) WITH PROPOFOL N/A 06/16/2016   Procedure: ESOPHAGOGASTRODUODENOSCOPY (EGD) WITH PROPOFOL;  Surgeon: [name], MD;  Location: [facility] ENDOSCOPY;  Service: Endoscopy;  Laterality: N/A;  . ESOPHAGOGASTRODUODENOSCOPY (EGD) WITH PROPOFOL N/A 05/05/2017   Procedure: ESOPHAGOGASTRODUODENOSCOPY (EGD) WITH PROPOFOL;  Surgeon: Manya Silvas, MD;  Location[facility] ENDOSCOPY;  Service: Endoscopy;  Laterality: N/A;  . ESOPHAGOGASTRODUODENOSCOPY (EGD) WITH PROPOFOL  N/A 06/15/2017   Procedure: ESOPHAGOGASTRODUODENOSCOPY (EGD) WITH PROPOFOL;  Surgeon: [name], MD;  Location: [facility] ENDOSCOPY;  Service: Endoscopy;  Laterality: N/A;  . GIVENS CAPSULE STUDY N/A 05/07/2016   Procedure: GIVENS CAPSULE STUDY;  Surgeon: Doran Stabler, MD;  Location: [place];  Service: Endoscopy;  Laterality: N/A;  . MANDIBULAR HARDWARE REMOVAL N/A 07/29/2013   Procedure: REMOVAL OF ARCH BARS;  Surgeon: Theodoro Kos, DO;  Location: [place];  Service: Plastics;  Laterality: N/A;  . ORIF MANDIBULAR FRACTURE N/A 06/05/2013   Procedure: REPAIR OF MANDIBULAR FRACTURE x 2 with maxillo-mandibular fixation ;  Surgeon: Theodoro Kos, DO;  Location: [place];  Service: Plastics;  Laterality: N/A;  . PERIPHERAL ARTERIAL STENT GRAFT Left     Prior to Admission medications   Medication Sig Start Date End Date Taking? Authorizing Provider  albuterol (PROVENTIL HFA;VENTOLIN HFA) 108 (90 Base) MCG/ACT inhaler Inhale 2 puffs into the lungs every 6 (six) hours as needed for wheezing or shortness of breath. 04/21/17   Lisa Roca, MD  alum & mag hydroxide-simeth (MAALOX/MYLANTA) 200-200-20 MG/5ML suspension Take 15 mLs every 4 (four) hours as needed by mouth for indigestion or heartburn. Patient not taking: Reported on 04/19/2017 01/25/17   Demetrios Loll, MD  atorvastatin (LIPITOR) 40 MG tablet Take 1 tablet (40 mg total) by mouth daily. 11/16/16   Vaughan Basta, MD  budesonide-formoterol (SYMBICORT) 160-4.5 MCG/ACT inhaler Inhale 2 puffs into the lungs daily. 11/16/16   Vaughan Basta, MD  calcium acetate (PHOSLO) 667 MG capsule Take 2,001 mg by mouth 3 (three) times daily.    [provider]  doxycycline (VIBRA-TABS) 100 MG tablet Take 1 tablet (100 mg total) by mouth 2 (two) times daily. 07/10/17   Harvest Dark, MD  ferrous sulfate 325 (65 FE) MG tablet Take 1 tablet by mouth 3 (three) times daily. 01/21/17   [provider]  furosemide (LASIX)  80 MG tablet Take 1 tablet (80 mg total) daily by mouth. 01/25/17   Demetrios Loll, MD  gabapentin (NEURONTIN) 300 MG capsule Take 1 capsule by mouth daily as directed 08/16/16   [provider]  ipratropium-albuterol (DUONEB) 0.5-2.5 (3) MG/3ML SOLN Take 3 mLs by nebulization every 6 (six) hours as needed. 11/28/16   Loletha Grayer, MD  labetalol (NORMODYNE) 100 MG tablet Take 100 mg by mouth daily. 03/24/17   [provider]  Multiple Vitamins-Minerals-FA (DIALYVITE SUPREME D) 3 MG TABS Take 1 tablet (3 mg total) by mouth daily. 11/28/16   Loletha Grayer, MD  nitroGLYCERIN (NITROSTAT) 0.4 MG SL tablet Place 1 tablet (0.4 mg total) under the tongue every 5 (five) minutes as needed. 04/12/16   Wende Bushy, MD  pantoprazole (PROTONIX) 40 MG tablet Take 1 tablet (40 mg total) by mouth daily. 11/28/16   Loletha Grayer, MD  Spacer/Aero Chamber Mouthpiece MISC 1 Units by Does not apply route every 4 (four) hours as needed (wheezing). 02/19/17   Darel Hong, MD  tiotropium (SPIRIVA HANDIHALER) 18 MCG inhalation  capsule Place 1 capsule (18 mcg total) into inhaler and inhale daily. 11/16/16 11/16/17  Vaughan Basta, MD    Allergies Patient has no known allergies.  Family History  Problem Relation Age of Onset  . Colon cancer Mother   . Cancer Father   . Cancer Sister   . Kidney disease Brother     Social History Social History   Tobacco Use  . Smoking status: Current Every Day Smoker    Packs/day: 0.15    Years: 40.00    Pack years: 6.00    Types: Cigarettes  . Smokeless tobacco: Never Used  Substance Use Topics  . Alcohol use: No    Comment: pt reports quitting after learning about cirrhosis  . Drug use: No    Frequency: 7.0 times per week    Types: Marijuana, Cocaine    Review of Systems See HPI  ____________________________________________   PHYSICAL EXAM:  VITAL SIGNS: ED Triage Vitals  Enc Vitals Group     BP 08/21/17 1150 114/84     Pulse Rate  08/21/17 1150 80     Resp 08/21/17 1150 18     Temp 08/21/17 1150 97.6 F (36.4 C)     Temp Source 08/21/17 1150 Oral     SpO2 08/21/17 1150 100 %     Weight 08/21/17 1153 165 lb (74.8 kg)     Height 08/21/17 1153 6\' 3"  (1.905 m)     Head Circumference --      Peak Flow --      Pain Score 08/21/17 1152 0     Pain Loc --      Pain Edu? --      Excl. in [place]? --     Constitutional: Alert and oriented. Well appearing and in no acute distress.  Neck:   Nontender with no meningismus, no masses, no stridor Cardiovascular: Normal rate, regular rhythm. Grossly normal heart sounds.  Good peripheral circulation. Respiratory: Normal respiratory effort.  No retractions. Lungs CTAB. Abdominal: Soft and nontender. No distention. No guarding no rebound  Musculoskeletal:  no edema Neurologic:  Normal speech and language. No gross focal neurologic deficits are appreciated.  Skin:  Skin is warm, dry and intact. No rash noted. Psychiatric: Mood and affect are normal. Speech and behavior are normal.  ____________________________________________   LABS (all labs ordered are listed, but only abnormal results are displayed)  Labs Reviewed - No data to display  Pertinent labs  results that were available during my care of the patient were reviewed by me and considered in my medical decision making (see chart for details). ____________________________________________  EKG  I personally interpreted any EKGs ordered by me or triage  ____________________________________________  RADIOLOGY  Pertinent labs & imaging results that were available during my care of the patient were reviewed by me and considered in my medical decision making (see chart for details). If possible, patient and/or family made aware of any abnormal findings.  No results found. ____________________________________________    PROCEDURES  Procedure(s) performed: None  Procedures  Critical Care performed:  None  ____________________________________________   INITIAL IMPRESSION / ASSESSMENT AND PLAN / ED COURSE  Pertinent labs & imaging results that were available during my care of the patient were reviewed by me and considered in my medical decision making (see chart for details).  Patient here for routine dialysis, did not missed dialysis last time has no complaints  ----------------------------------------- 3:50 PM on 08/21/2017 -----------------------------------------  Off the floor getting dialysis, signed out to  Dr. Quentin Cornwall for when he gets back.  Usually however he storms out as soon as is done.    ____________________________________________   FINAL CLINICAL IMPRESSION(S) / ED DIAGNOSES  Final diagnoses:  None      This chart was dictated using voice recognition software.  Despite best efforts to proofread,  errors can occur which can change meaning.      Schuyler Amor, MD 08/21/17 1255    Schuyler Amor, MD 08/21/17 1550

## 2017-08-21 NOTE — Progress Notes (Signed)
Post HD assessment. PT tolerated tx well without c/o or complications. Net UF 3130, goal met. Pt requested to be taken off early,MD aware, 42 minutes left in tx.    08/21/17 1735  Vital Signs  Temp 97.9 F (36.6 C)  Temp Source Oral  Pulse Rate 81  Pulse Rate Source Monitor  Resp 15  BP 131/78  BP Location Left Arm  BP Method Automatic  Patient Position (if appropriate) Lying  Oxygen Therapy  SpO2 100 %  O2 Device Room Air  Dialysis Weight  Weight 72.8 kg (160 lb 7.9 oz)  Type of Weight Post-Dialysis  Post-Hemodialysis Assessment  Rinseback Volume (mL) 250 mL  KECN 62.6 V  Dialyzer Clearance Lightly streaked  Duration of HD Treatment -hour(s) 3 hour(s)  Hemodialysis Intake (mL) 500 mL  UF Total -Machine (mL) 3630 mL  Net UF (mL) 3130 mL  Tolerated HD Treatment Yes  Education / Care Plan  Dialysis Education Provided Yes  Documented Education in Care Plan Yes  Hemodialysis Catheter Right Internal jugular Double-lumen  No Placement Date or Time found.   Placed prior to admission: Yes  Orientation: Right  Access Location: Internal jugular  Hemodialysis Catheter Type: Double-lumen  Site Condition No complications  Blue Lumen Status Heparin locked  Red Lumen Status Heparin locked  Purple Lumen Status N/A  Catheter fill solution Heparin 1000 units/ml  Catheter fill volume (Arterial) 1.5 cc  Catheter fill volume (Venous) 1.5  Dressing Type Biopatch  Dressing Status Clean;Dry;Intact  Drainage Description None  Post treatment catheter status Capped and Clamped

## 2017-08-21 NOTE — Progress Notes (Signed)
Pre HD assessment    08/21/17 1437  Neurological  Level of Consciousness Alert  Orientation Level Oriented X4  Respiratory  Respiratory Pattern Regular;Unlabored  Chest Assessment Chest expansion symmetrical  Cough Non-productive  Cardiac  ECG Monitor Yes  Vascular  R Radial Pulse +2  L Radial Pulse +2  Edema Generalized  Integumentary  Integumentary (WDL) X  Skin Color Appropriate for ethnicity  Musculoskeletal  Musculoskeletal (WDL) X  Generalized Weakness Yes  Assistive Device None  GU Assessment  Genitourinary (WDL) X  Genitourinary Symptoms  (HD)  Psychosocial  Psychosocial (WDL) WDL

## 2017-08-21 NOTE — Progress Notes (Signed)
HD tx end   08/21/17 1732  Vital Signs  Pulse Rate 80  Pulse Rate Source Monitor  Resp 19  BP 132/80  BP Location Left Arm  BP Method Automatic  Patient Position (if appropriate) Lying  Oxygen Therapy  SpO2 100 %  O2 Device Room Air  During Hemodialysis Assessment  Dialysis Fluid Bolus Normal Saline  Bolus Amount (mL) 250 mL  Intra-Hemodialysis Comments Tx completed

## 2017-08-21 NOTE — ED Notes (Signed)
Dr [name] notified that Stanley [name] is leaving at 73 today and cannot do procedure after dialysis. Pt scheduled for paracentesis wed 10:00

## 2017-08-21 NOTE — Progress Notes (Signed)
[facility]  ROUNDING NOTE   Subjective:   Patient presents to the emergency room with tense ascites and shortness of breath Requesting paracentesis  Objective:  Vital signs in last 24 hours:  Temp:  [97.6 F (36.4 C)] 97.6 F (36.4 C) (06/10 1150) Pulse Rate:  [80] 80 (06/10 1150) Resp:  [18] 18 (06/10 1150) BP: (114)/(84) 114/84 (06/10 1150) SpO2:  [100 %] 100 % (06/10 1150) Weight:  [74.8 kg (165 lb)] 74.8 kg (165 lb) (06/10 1153)  Weight change:  Filed Weights   08/21/17 1153  Weight: 74.8 kg (165 lb)    Intake/Output: No intake/output data recorded.   Intake/Output this shift:  No intake/output data recorded.  Physical Exam: General: NAD  Head: Normocephalic, atraumatic. Moist oral mucosal membranes  Eyes: Anicteric  Neck: Supple  Lungs:  normal effort,  diffuse bilateral rales  Heart: Regular rate and rhythm  Abdomen:  Soft, nontender, from tense ascites distended   Extremities: + peripheral edema.  Neurologic: Nonfocal, moving all four extremities  Skin: No lesions  Access: R IJ permcath    Basic Metabolic Panel: Recent Labs  Lab 08/18/17 1445  NA 136  K >7.5*  CL 104  CO2 20*  GLUCOSE 98  BUN 67*  CREATININE 9.17*  CALCIUM 9.7    Liver Function Tests: Recent Labs  Lab 08/18/17 1445  AST 27  ALT 27  ALKPHOS 188*  BILITOT 0.5  PROT 7.3  ALBUMIN 3.0*   No results for input(s): LIPASE, AMYLASE in the last 168 hours. No results for input(s): AMMONIA in the last 168 hours.  CBC: Recent Labs  Lab 08/18/17 1445  WBC 6.4  NEUTROABS 4.3  HGB 9.6*  HCT 31.2*  MCV 80.2  PLT 294    Cardiac Enzymes: No results for input(s): CKTOTAL, CKMB, CKMBINDEX, TROPONINI in the last 168 hours.  BNP: Invalid input(s): POCBNP  CBG: No results for input(s): GLUCAP in the last 168 hours.  Microbiology: Results for orders placed or performed during the hospital encounter of 07/12/17  Culture, blood (Routine X 2) w Reflex to ID  Panel     Status: None   Collection Time: 07/12/17  4:43 PM  Result Value Ref Range Status   Specimen Description BLOOD LINE CENTRAL  Final   Special Requests   Final    BOTTLES DRAWN AEROBIC AND ANAEROBIC Blood Culture adequate volume   Culture   Final    NO GROWTH 5 DAYS Performed at [facility], [address] [place], [place] [address]    Report Status 07/17/2017 FINAL  Final    Coagulation Studies: No results for input(s): LABPROT, INR in the last 72 hours.  Urinalysis: No results for input(s): COLORURINE, LABSPEC, PHURINE, GLUCOSEU, HGBUR, BILIRUBINUR, KETONESUR, PROTEINUR, UROBILINOGEN, NITRITE, LEUKOCYTESUR in the last 72 hours.  Invalid input(s): APPERANCEUR    Imaging: No results found.   Medications:       Assessment/ Plan:  [name] is a 57 y.o. black male withend stage renal disease on hemodialysis secondary to Alport's syndrome, ascites, hypertension, anemia of chronic kidney disease, coronary artery disease, peripheral vascular disease, hyperlipidemia, gastrointestinal AVMs, pulmonary hypertension  nooutpatientdialysis unit currently  1. Acute pulmonary edema Urgent hemodialysis today for volume removal  2.  End-stage renal disease:  Hemodialysis today. Outpatient planning is ongoing.  Using CVC. Will need follow up with vascular for AVF.   3. Hyperkalemia - Urgent HD today  4. Tense ascites Paracentesis planned for wednesday   LOS:  0 Stanley [name] 6/10/20191:08 PM

## 2017-08-21 NOTE — ED Provider Notes (Signed)
Patient back from dialysis.  Tolerated without complication.  Stable for discharge.   Merlyn Lot, MD 08/21/17 1739

## 2017-08-21 NOTE — Care Management (Addendum)
Per dialysis coordinator with [employer] Stanley [name] patient had outpatient hemodialysis center chair for 08/17/17 at [facility]. RNCM has notified Estill Bamberg of patient's return to ED for hemodialysis. RNCM has reached out to [employer] also-  [phone] ext [phone] social worker. Per [facility], he was supposed to start 08/17/17 T, Th, Sat - they see no incident as to why he is here at [facility] for hemodialysis. Message left for social worker with [facility] to call this RNCM. Update: RNCM spoke with Stanley [name] and patient will have to go to a behavior meeting with [facility] to establish outpatient hemodiaylsis chair.

## 2017-08-21 NOTE — ED Triage Notes (Signed)
Here for scheduled dialysis

## 2017-08-21 NOTE — ED Notes (Signed)
Nurse informed of K of 7.0 from lab draw prior to dialysis. Dr. Quentin Cornwall notified.

## 2017-08-23 ENCOUNTER — Non-Acute Institutional Stay
Admission: EM | Admit: 2017-08-23 | Discharge: 2017-08-23 | Disposition: A | Discharge status: Home / Self Care | Payer: Medicare Other | Attending: Emergency Medicine | Admitting: Emergency Medicine

## 2017-08-23 ENCOUNTER — Ambulatory Visit
Admit: 2017-08-23 | Discharge: 2017-08-23 | Disposition: A | Discharge status: Home / Self Care | Payer: Medicare Other | Attending: Nephrology | Admitting: Nephrology

## 2017-08-23 ENCOUNTER — Other Ambulatory Visit: Payer: Self-pay | Admitting: Nephrology

## 2017-08-23 ENCOUNTER — Encounter: Payer: Self-pay | Admitting: Emergency Medicine

## 2017-08-23 ENCOUNTER — Emergency Department: Admission: EM | Admit: 2017-08-23 | Discharge: 2017-08-23 | Payer: Medicare Other | Source: Home / Self Care

## 2017-08-23 DIAGNOSIS — I509 Heart failure, unspecified: Secondary | ICD-10-CM | POA: Insufficient documentation

## 2017-08-23 DIAGNOSIS — Z9114 Patient's other noncompliance with medication regimen: Secondary | ICD-10-CM | POA: Insufficient documentation

## 2017-08-23 DIAGNOSIS — I8510 Secondary esophageal varices without bleeding: Secondary | ICD-10-CM | POA: Diagnosis not present

## 2017-08-23 DIAGNOSIS — I272 Pulmonary hypertension, unspecified: Secondary | ICD-10-CM | POA: Diagnosis not present

## 2017-08-23 DIAGNOSIS — R188 Other ascites: Secondary | ICD-10-CM | POA: Insufficient documentation

## 2017-08-23 DIAGNOSIS — E875 Hyperkalemia: Secondary | ICD-10-CM | POA: Insufficient documentation

## 2017-08-23 DIAGNOSIS — D123 Benign neoplasm of transverse colon: Secondary | ICD-10-CM | POA: Diagnosis not present

## 2017-08-23 DIAGNOSIS — Z992 Dependence on renal dialysis: Secondary | ICD-10-CM | POA: Insufficient documentation

## 2017-08-23 DIAGNOSIS — K210 Gastro-esophageal reflux disease with esophagitis: Secondary | ICD-10-CM | POA: Insufficient documentation

## 2017-08-23 DIAGNOSIS — K745 Biliary cirrhosis, unspecified: Secondary | ICD-10-CM | POA: Insufficient documentation

## 2017-08-23 DIAGNOSIS — Z79899 Other long term (current) drug therapy: Secondary | ICD-10-CM | POA: Insufficient documentation

## 2017-08-23 DIAGNOSIS — K31811 Angiodysplasia of stomach and duodenum with bleeding: Secondary | ICD-10-CM | POA: Diagnosis not present

## 2017-08-23 DIAGNOSIS — I132 Hypertensive heart and chronic kidney disease with heart failure and with stage 5 chronic kidney disease, or end stage renal disease: Secondary | ICD-10-CM | POA: Insufficient documentation

## 2017-08-23 DIAGNOSIS — I2510 Atherosclerotic heart disease of native coronary artery without angina pectoris: Secondary | ICD-10-CM | POA: Diagnosis not present

## 2017-08-23 DIAGNOSIS — Z951 Presence of aortocoronary bypass graft: Secondary | ICD-10-CM | POA: Insufficient documentation

## 2017-08-23 DIAGNOSIS — K7031 Alcoholic cirrhosis of liver with ascites: Secondary | ICD-10-CM

## 2017-08-23 DIAGNOSIS — Z809 Family history of malignant neoplasm, unspecified: Secondary | ICD-10-CM | POA: Insufficient documentation

## 2017-08-23 DIAGNOSIS — J449 Chronic obstructive pulmonary disease, unspecified: Secondary | ICD-10-CM | POA: Insufficient documentation

## 2017-08-23 DIAGNOSIS — I739 Peripheral vascular disease, unspecified: Secondary | ICD-10-CM | POA: Insufficient documentation

## 2017-08-23 DIAGNOSIS — K2970 Gastritis, unspecified, without bleeding: Secondary | ICD-10-CM | POA: Diagnosis not present

## 2017-08-23 DIAGNOSIS — E440 Moderate protein-calorie malnutrition: Secondary | ICD-10-CM | POA: Insufficient documentation

## 2017-08-23 DIAGNOSIS — Z841 Family history of disorders of kidney and ureter: Secondary | ICD-10-CM | POA: Insufficient documentation

## 2017-08-23 DIAGNOSIS — F17210 Nicotine dependence, cigarettes, uncomplicated: Secondary | ICD-10-CM | POA: Insufficient documentation

## 2017-08-23 DIAGNOSIS — E785 Hyperlipidemia, unspecified: Secondary | ICD-10-CM | POA: Insufficient documentation

## 2017-08-23 DIAGNOSIS — Z8614 Personal history of Methicillin resistant Staphylococcus aureus infection: Secondary | ICD-10-CM | POA: Diagnosis not present

## 2017-08-23 DIAGNOSIS — N186 End stage renal disease: Secondary | ICD-10-CM | POA: Insufficient documentation

## 2017-08-23 DIAGNOSIS — D631 Anemia in chronic kidney disease: Secondary | ICD-10-CM | POA: Diagnosis not present

## 2017-08-23 DIAGNOSIS — Z800 Family history of malignant neoplasm of digestive organs: Secondary | ICD-10-CM | POA: Insufficient documentation

## 2017-08-23 DIAGNOSIS — Z7951 Long term (current) use of inhaled steroids: Secondary | ICD-10-CM | POA: Insufficient documentation

## 2017-08-23 LAB — RENAL FUNCTION PANEL
ANION GAP: 12 (ref 5–15)
Albumin: 2.8 g/dL — ABNORMAL LOW (ref 3.5–5.0)
BUN: 35 mg/dL — ABNORMAL HIGH (ref 6–20)
CO2: 25 mmol/L (ref 22–32)
Calcium: 8.5 mg/dL — ABNORMAL LOW (ref 8.9–10.3)
Chloride: 101 mmol/L (ref 101–111)
Creatinine, Ser: 8.37 mg/dL — ABNORMAL HIGH (ref 0.61–1.24)
GFR calc Af Amer: 7 mL/min — ABNORMAL LOW (ref >60)
GFR calc non Af Amer: 6 mL/min — ABNORMAL LOW (ref >60)
GLUCOSE: 59 mg/dL — AB (ref 65–99)
Phosphorus: 4.8 mg/dL — ABNORMAL HIGH (ref 2.5–4.6)
Potassium: 6.2 mmol/L — ABNORMAL HIGH (ref 3.5–5.1)
Sodium: 138 mmol/L (ref 135–145)

## 2017-08-23 LAB — CBC
HEMATOCRIT: 31 % — AB (ref 40.0–52.0)
HEMOGLOBIN: 9.4 g/dL — AB (ref 13.0–18.0)
MCH: 24.5 pg — AB (ref 26.0–34.0)
MCHC: 30.4 g/dL — AB (ref 32.0–36.0)
MCV: 80.7 fL (ref 80.0–100.0)
Platelets: 238 10*3/uL (ref 150–440)
RBC: 3.84 MIL/uL — ABNORMAL LOW (ref 4.40–5.90)
RDW: 19.7 % — ABNORMAL HIGH (ref 11.5–14.5)
WBC: 5 10*3/uL (ref 3.8–10.6)

## 2017-08-23 MED ORDER — CHLORHEXIDINE GLUCONATE CLOTH 2 % EX PADS: 6.0000 | MEDICATED_PAD | Freq: Every day | CUTANEOUS | Status: DC

## 2017-08-23 MED ORDER — DIPHENHYDRAMINE HCL 50 MG/ML IJ SOLN
25.0000 mg | Freq: Once | INTRAMUSCULAR | Status: AC
  Administered 2017-08-23: 25 mg via INTRAVENOUS

## 2017-08-23 NOTE — ED Triage Notes (Signed)
Pt here for dialysis

## 2017-08-23 NOTE — Progress Notes (Signed)
Pre HD assessment    08/23/17 1133  Vital Signs  Temp 97.6 F (36.4 C)  Temp Source Oral  Pulse Rate 85  Pulse Rate Source Monitor  Resp 20  BP 118/87  BP Location Left Arm  BP Method Automatic  Patient Position (if appropriate) Sitting  Oxygen Therapy  SpO2 100 %  O2 Device Room Air  Pain Assessment  Pain Scale 0-10  Pain Score 0  Dialysis Weight  Weight 72.5 kg (159 lb 13.3 oz)  Type of Weight Pre-Dialysis  Time-Out for Hemodialysis  What Procedure? HD  Pt Identifiers(min of two) First/Last Name;MRN/Account#  Correct Site? Yes  Correct Side? Yes  Correct Procedure? Yes  Consents Verified? Yes  Rad Studies Available? N/A  Safety Precautions Reviewed? Yes  Engineer, civil (consulting) Number  (5A)  Station Number 3  UF/Alarm Test Passed  Conductivity: Meter 13.8  Conductivity: Machine  14.1  pH 7.6  Reverse Osmosis main  Normal Saline Lot Number [id number]  Dialyzer Lot Number [id number]  Disposable Set Lot Number [id number]  Machine Temperature 98.6 F (37 C)  Musician and Audible Yes  Blood Lines Intact and Secured Yes  Pre Treatment Patient Checks  Vascular access used during treatment Catheter  Patient is receiving dialysis in a chair Yes  Hepatitis B Surface Antigen Results Negative  Date Hepatitis B Surface Antigen Drawn 06/16/17  Hepatitis B Surface Antibody  (>10)  Date Hepatitis B Surface Antibody Drawn 06/16/17  Hemodialysis Consent Verified Yes  Hemodialysis Standing Orders Initiated Yes  ECG (Telemetry) Monitor On Yes  Prime Ordered Normal Saline  Length of  DialysisTreatment -hour(s) 3.5 Hour(s)  Dialyzer Elisio 17H NR  Dialysate 2K, 2.5 Ca  Dialysis Anticoagulant None  Dialysate Flow Ordered 800  Blood Flow Rate Ordered 400 mL/min  Ultrafiltration Goal 4 Liters  Pre Treatment Labs Renal panel;CBC  Dialysis Blood Pressure Support Ordered Normal Saline  Education / Care Plan  Dialysis Education Provided Yes  Documented Education in Care  Plan Yes  Hemodialysis Catheter Right Internal jugular Double-lumen  No Placement Date or Time found.   Placed prior to admission: Yes  Orientation: Right  Access Location: Internal jugular  Hemodialysis Catheter Type: Double-lumen  Site Condition No complications  Blue Lumen Status Heparin locked  Red Lumen Status Heparin locked  Dressing Type Biopatch  Dressing Status Clean;Dry;Intact  Drainage Description None

## 2017-08-23 NOTE — Progress Notes (Signed)
Post HD assessment    08/23/17 1513  Neurological  Level of Consciousness Alert  Orientation Level Oriented X4  Respiratory  Respiratory Pattern Regular;Unlabored  Chest Assessment Chest expansion symmetrical  Cough Non-productive  Cardiac  ECG Monitor Yes  Vascular  R Radial Pulse +2  L Radial Pulse +2  Edema Generalized  Integumentary  Integumentary (WDL) X  Skin Color Appropriate for ethnicity  Musculoskeletal  Musculoskeletal (WDL) X  Generalized Weakness Yes  Assistive Device None  GU Assessment  Genitourinary (WDL) X  Genitourinary Symptoms  (HD)  Psychosocial  Psychosocial (WDL) WDL

## 2017-08-23 NOTE — ED Provider Notes (Signed)
-----------------------------------------   3:49 PM on 08/23/2017 -----------------------------------------  This patient was seen earlier by Dr. Joni Fears and sent for dialysis.  Per nursing staff, he returned from dialysis in stable condition.  When his name appeared on the chart rack I went to evaluate him for discharge but by that time he had left the ED.     Arta Silence, MD 08/23/17 1551

## 2017-08-23 NOTE — Progress Notes (Signed)
Pre HD assessment    08/23/17 1134  Neurological  Level of Consciousness Alert  Orientation Level Oriented X4  Respiratory  Respiratory Pattern Regular;Unlabored  Chest Assessment Chest expansion symmetrical  Cough Non-productive  Cardiac  ECG Monitor Yes  Cardiac Rhythm ST  Vascular  R Radial Pulse +2  L Radial Pulse +2  Edema Generalized  Integumentary  Integumentary (WDL) X  Skin Color Appropriate for ethnicity  Musculoskeletal  Musculoskeletal (WDL) X  Generalized Weakness Yes  Assistive Device None  GU Assessment  Genitourinary (WDL) X  Genitourinary Symptoms  (HD)  Psychosocial  Psychosocial (WDL) WDL

## 2017-08-23 NOTE — ED Triage Notes (Signed)
Pt scheduled for outpatient procedure

## 2017-08-23 NOTE — Progress Notes (Signed)
Pre HD assessment    08/23/17 1133  Vital Signs  Temp Source Oral  Pulse Rate 85  Pulse Rate Source Monitor  Resp 20  BP 118/87  BP Location Left Arm  BP Method Automatic  Patient Position (if appropriate) Sitting  Oxygen Therapy  SpO2 100 %  O2 Device Room Air  Pain Assessment  Pain Scale 0-10  Pain Score 0  Dialysis Weight  Weight 72.5 kg (159 lb 13.3 oz)  Type of Weight Pre-Dialysis  Time-Out for Hemodialysis  What Procedure? HD  Pt Identifiers(min of two) First/Last Name;MRN/Account#  Correct Site? Yes  Correct Side? Yes  Correct Procedure? Yes  Consents Verified? Yes  Rad Studies Available? N/A  Safety Precautions Reviewed? Yes  Engineer, civil (consulting) Number  (5A)  Station Number 3  UF/Alarm Test Passed  Conductivity: Meter 13.8  Conductivity: Machine  14.1  pH 7.6  Reverse Osmosis main  Normal Saline Lot Number [id number]  Dialyzer Lot Number [id number]  Disposable Set Lot Number [id number]  Machine Temperature 98.6 F (37 C)  Musician and Audible Yes  Blood Lines Intact and Secured Yes  Pre Treatment Patient Checks  Vascular access used during treatment Catheter  Patient is receiving dialysis in a chair Yes  Hepatitis B Surface Antigen Results Negative  Date Hepatitis B Surface Antigen Drawn 06/16/17  Hepatitis B Surface Antibody  (>10)  Date Hepatitis B Surface Antibody Drawn 06/16/17  Hemodialysis Consent Verified Yes  Hemodialysis Standing Orders Initiated Yes  ECG (Telemetry) Monitor On Yes  Prime Ordered Normal Saline  Length of  DialysisTreatment -hour(s) 3.5 Hour(s)  Dialyzer Elisio 17H NR  Dialysate 2K, 2.5 Ca  Dialysis Anticoagulant None  Dialysate Flow Ordered 800  Blood Flow Rate Ordered 400 mL/min  Ultrafiltration Goal 4 Liters  Pre Treatment Labs Renal panel;CBC  Dialysis Blood Pressure Support Ordered Normal Saline  Education / Care Plan  Dialysis Education Provided Yes  Documented Education in Care Plan Yes  Hemodialysis  Catheter Right Internal jugular Double-lumen  No Placement Date or Time found.   Placed prior to admission: Yes  Orientation: Right  Access Location: Internal jugular  Hemodialysis Catheter Type: Double-lumen  Site Condition No complications  Blue Lumen Status Heparin locked  Red Lumen Status Heparin locked  Dressing Type Biopatch  Dressing Status Clean;Dry;Intact  Drainage Description None

## 2017-08-23 NOTE — Progress Notes (Signed)
HD tx end   08/23/17 1512  Vital Signs  Pulse Rate 77  Pulse Rate Source Monitor  Resp 15  BP 107/66  BP Location Left Arm  BP Method Automatic  Patient Position (if appropriate) Sitting  Oxygen Therapy  SpO2 99 %  O2 Device Room Air  During Hemodialysis Assessment  Dialysis Fluid Bolus Normal Saline  Bolus Amount (mL) 250 mL  Intra-Hemodialysis Comments Tx completed

## 2017-08-23 NOTE — Progress Notes (Signed)
Post HD assessment    08/23/17 1515  Vital Signs  Temp 98.4 F (36.9 C)  Temp Source Oral  Pulse Rate 78  Pulse Rate Source Monitor  Resp 16  BP (!) 119/58  BP Location Left Arm  BP Method Automatic  Patient Position (if appropriate) Sitting  Oxygen Therapy  SpO2 100 %  O2 Device Room Air  Dialysis Weight  Weight 70.5 kg (155 lb 6.8 oz)  Type of Weight Post-Dialysis  Post-Hemodialysis Assessment  Rinseback Volume (mL) 250 mL  KECN 74 V  Dialyzer Clearance Lightly streaked  Duration of HD Treatment -hour(s) 3.5 hour(s)  Hemodialysis Intake (mL) 500 mL  UF Total -Machine (mL) 4327 mL  Net UF (mL) 3827 mL  Tolerated HD Treatment Yes  Education / Care Plan  Dialysis Education Provided Yes  Documented Education in Care Plan Yes  Hemodialysis Catheter Right Internal jugular Double-lumen  No Placement Date or Time found.   Placed prior to admission: Yes  Orientation: Right  Access Location: Internal jugular  Hemodialysis Catheter Type: Double-lumen  Site Condition No complications  Blue Lumen Status Heparin locked  Red Lumen Status Heparin locked  Purple Lumen Status N/A  Catheter fill solution Heparin 1000 units/ml  Catheter fill volume (Arterial) 1.5 cc  Catheter fill volume (Venous) 1.5  Dressing Type Biopatch  Dressing Status Clean;Dry;Intact  Drainage Description None  Dressing Change Due 08/25/17  Post treatment catheter status Capped and Clamped

## 2017-08-23 NOTE — ED Provider Notes (Signed)
[name] Emergency Department Provider Note  ____________________________________________  Time seen: Approximately 12:13 PM  I have reviewed the triage vital signs and the nursing notes.   HISTORY  Chief Complaint Vascular Access Problem    HPI Stanley [name] is a 57 y.o. male with a history of CHF, ascites, end-stage renal disease on hemodialysis, medicine noncompliance who presents for routine dialysis.  He just had a paracentesis done on an outpatient basis as well.  No acute symptoms.  Feels like he is in his baseline state of health without any chest pain shortness of breath dizziness palpitations syncope or other acute symptoms.      Past Medical History:  Diagnosis Date  . Alcohol abuse   . CHF (congestive heart failure) ([place])   . Cirrhosis ([place])   . Coronary artery disease 2009  . Drug abuse ([place])   . End stage renal disease on dialysis [facility]) NEPHROLOGIST-   DR [facility]  IN [place]   HEMODIALYSIS --   TUES/  THURS/  SAT  . Gastrointestinal bleed 06/13/2017   From chart...hx of multiple GI bleeds  . GERD (gastroesophageal reflux disease)   . Hyperlipidemia   . Hypertension   . PAD (peripheral artery disease) ([place])   . Renal insufficiency    Per pt, 32 oz fluid restriction per day  . S/P triple vessel bypass 06/09/2016   2009ish  . Suicidal ideation    & HOMICIDAL IDEATION --  06-16-2013   ADMITTED TO BEHAVIOR HEALTH     Patient Active Problem List   Diagnosis Date Noted  . ESRD (end stage renal disease) on dialysis ([place]) 07/28/2017  . Protein-calorie malnutrition, severe 06/14/2017  . Encounter for dialysis [facility])   . Palliative care by specialist   . Goals of care, counseling/discussion   . Malnutrition of moderate degree 06/05/2017  . Secondary esophageal varices without bleeding ([place])   . Stomach irritation   . Idiopathic esophageal varices without bleeding ([place])   . Alcoholic hepatitis with ascites 05/24/2017  . ESRD  (end stage renal disease) ([place]) 04/28/2017  . Uremia 03/08/2017  . ESRD on hemodialysis ([place]) 03/03/2017  . Weakness 02/28/2017  . Hypocalcemia 02/22/2017  . Shortness of breath 11/26/2016  . COPD (chronic obstructive pulmonary disease) ([place]) 10/30/2016  . COPD exacerbation ([place]) 10/29/2016  . Anemia   . Heme positive stool   . Ulceration of intestine   . Benign neoplasm of transverse colon   . Acute gastrointestinal hemorrhage   . Esophageal candidiasis ([place])   . Angiodysplasia of intestinal tract   . Acute respiratory failure with hypoxia ([place]) 07/03/2016  . GI bleeding 06/24/2016  . Rectal bleeding 06/14/2016  . Anemia of chronic disease 06/01/2016  . MRSA carrier 06/01/2016  . Chronic renal failure 05/23/2016  . Ischemic heart disease 05/23/2016  . Angiodysplasia of small intestine   . Melena   . Small bowel bleed not requiring more than 4 units of blood in 24 hours, ICU, or surgery   . Anemia due to chronic blood loss   . Abdominal pain 05/05/2016  . Acute posthemorrhagic anemia 04/17/2016  . Gastrointestinal bleed 04/17/2016  . History of esophagogastroduodenoscopy (EGD) 04/17/2016  . Elevated troponin 04/17/2016  . Alcohol abuse 04/17/2016  . Upper GI bleed 01/19/2016  . Blood in stool   . Angiodysplasia of stomach and duodenum with hemorrhage   . Gastritis   . Reflux esophagitis   . GI bleed 05/16/2015  . Acute GI bleeding   . Symptomatic  anemia 04/30/2015  . HTN (hypertension) 04/06/2015  . GERD (gastroesophageal reflux disease) 04/06/2015  . HLD (hyperlipidemia) 04/06/2015  . Dyspnea 04/06/2015  . Cirrhosis of liver with ascites ([place]) 04/06/2015  . Ascites 04/06/2015  . GIB (gastrointestinal bleeding) 03/23/2015  . Homicidal ideation 06/19/2013  . Suicidal intent 06/19/2013  . Homicidal ideations 06/19/2013  . Hyperkalemia 06/16/2013  . Mandible fracture ([place]) 06/05/2013  . Fracture, mandible ([place]) 06/02/2013  . Coronary atherosclerosis of native  coronary artery 06/02/2013  . ESRD on dialysis ([place]) 06/02/2013  . Mandible open fracture ([place]) 06/02/2013     Past Surgical History:  Procedure Laterality Date  . A/V FISTULAGRAM Right 06/06/2017   Procedure: A/V FISTULAGRAM;  Surgeon: Katha Cabal, MD;  Location: [place] CV LAB;  Service: Cardiovascular;  Laterality: Right;  . A/V SHUNT INTERVENTION N/A 06/06/2017   Procedure: A/V SHUNT INTERVENTION;  Surgeon: Katha Cabal, MD;  Location: [place] CV LAB;  Service: Cardiovascular;  Laterality: N/A;  . AGILE CAPSULE N/A 06/19/2016   Procedure: AGILE CAPSULE;  Surgeon: [name], MD;  Location: [facility] ENDOSCOPY;  Service: Endoscopy;  Laterality: N/A;  . COLONOSCOPY WITH PROPOFOL N/A 06/18/2016   Procedure: COLONOSCOPY WITH PROPOFOL;  Surgeon: [name], MD;  Location: [facility] ENDOSCOPY;  Service: Endoscopy;  Laterality: N/A;  . COLONOSCOPY WITH PROPOFOL N/A 08/12/2016   Procedure: COLONOSCOPY WITH PROPOFOL;  Surgeon: [name], MD;  Location: [facility] ENDOSCOPY;  Service: Endoscopy;  Laterality: N/A;  . COLONOSCOPY WITH PROPOFOL N/A 05/05/2017   Procedure: COLONOSCOPY WITH PROPOFOL;  Surgeon: Manya Silvas, MD;  Location: [facility] ENDOSCOPY;  Service: Endoscopy;  Laterality: N/A;  . CORONARY ANGIOPLASTY  ?   PT UNABLE TO TELL IF  BEFORE OR AFTER  CABG  . CORONARY ARTERY BYPASS GRAFT  2008  ([place])   3 VESSEL  . DIALYSIS FISTULA CREATION  LAST SURGERY  APPOX  2008  . ENTEROSCOPY N/A 05/10/2016   Procedure: ENTEROSCOPY;  Surgeon: Jerene Bears, MD;  Location: [place];  Service: Gastroenterology;  Laterality: N/A;  . ENTEROSCOPY N/A 08/12/2016   Procedure: ENTEROSCOPY;  Surgeon: [name], MD;  Location: [facility] ENDOSCOPY;  Service: Endoscopy;  Laterality: N/A;  . ENTEROSCOPY Left 06/03/2017   Procedure: ENTEROSCOPY;  Surgeon: Virgel Manifold, MD;  Location: [facility] ENDOSCOPY;  Service: Endoscopy;  Laterality: Left;  Procedure date will ultimately depend on when patient  is medically optimized before the procedure, pending hemodialysis and blood transfusions etc. Will place on schedule and change depending on clinical status.   . ENTEROSCOPY N/A 06/05/2017   Procedure: ENTEROSCOPY;  Surgeon: Virgel Manifold, MD;  Location: [facility] ENDOSCOPY;  Service: Endoscopy;  Laterality: N/A;  . ENTEROSCOPY N/A 06/15/2017   Procedure: Push ENTEROSCOPY;  Surgeon: [name], MD;  Location: [facility] ENDOSCOPY;  Service: Endoscopy;  Laterality: N/A;  . ESOPHAGOGASTRODUODENOSCOPY N/A 05/07/2015   Procedure: ESOPHAGOGASTRODUODENOSCOPY (EGD);  Surgeon: Hulen Luster, MD;  Location: [facility] ENDOSCOPY;  Service: Endoscopy;  Laterality: N/A;  . ESOPHAGOGASTRODUODENOSCOPY (EGD) WITH PROPOFOL N/A 05/17/2015   Procedure: ESOPHAGOGASTRODUODENOSCOPY (EGD) WITH PROPOFOL;  Surgeon: [name], MD;  Location: [facility] ENDOSCOPY;  Service: Endoscopy;  Laterality: N/A;  . ESOPHAGOGASTRODUODENOSCOPY (EGD) WITH PROPOFOL N/A 01/20/2016   Procedure: ESOPHAGOGASTRODUODENOSCOPY (EGD) WITH PROPOFOL;  Surgeon: [name], MD;  Location: [facility] ENDOSCOPY;  Service: Endoscopy;  Laterality: N/A;  . ESOPHAGOGASTRODUODENOSCOPY (EGD) WITH PROPOFOL N/A 04/17/2016   Procedure: ESOPHAGOGASTRODUODENOSCOPY (EGD) WITH PROPOFOL;  Surgeon: Lin Landsman, MD;  Location: [facility] ENDOSCOPY;  Service: Gastroenterology;  Laterality: N/A;  .  ESOPHAGOGASTRODUODENOSCOPY (EGD) WITH PROPOFOL  05/09/2016   Procedure: ESOPHAGOGASTRODUODENOSCOPY (EGD) WITH PROPOFOL;  Surgeon: Jerene Bears, MD;  Location: [place];  Service: Endoscopy;;  . ESOPHAGOGASTRODUODENOSCOPY (EGD) WITH PROPOFOL N/A 06/16/2016   Procedure: ESOPHAGOGASTRODUODENOSCOPY (EGD) WITH PROPOFOL;  Surgeon: [name], MD;  Location: [facility] ENDOSCOPY;  Service: Endoscopy;  Laterality: N/A;  . ESOPHAGOGASTRODUODENOSCOPY (EGD) WITH PROPOFOL N/A 05/05/2017   Procedure: ESOPHAGOGASTRODUODENOSCOPY (EGD) WITH PROPOFOL;  Surgeon: Manya Silvas, MD;  Location: [facility] ENDOSCOPY;  Service:  Endoscopy;  Laterality: N/A;  . ESOPHAGOGASTRODUODENOSCOPY (EGD) WITH PROPOFOL N/A 06/15/2017   Procedure: ESOPHAGOGASTRODUODENOSCOPY (EGD) WITH PROPOFOL;  Surgeon: [name], MD;  Location: [facility] ENDOSCOPY;  Service: Endoscopy;  Laterality: N/A;  . GIVENS CAPSULE STUDY N/A 05/07/2016   Procedure: GIVENS CAPSULE STUDY;  Surgeon: Doran Stabler, MD;  Location: [place];  Service: Endoscopy;  Laterality: N/A;  . MANDIBULAR HARDWARE REMOVAL N/A 07/29/2013   Procedure: REMOVAL OF ARCH BARS;  Surgeon: Theodoro Kos, DO;  Location: [place];  Service: Plastics;  Laterality: N/A;  . ORIF MANDIBULAR FRACTURE N/A 06/05/2013   Procedure: REPAIR OF MANDIBULAR FRACTURE x 2 with maxillo-mandibular fixation ;  Surgeon: Theodoro Kos, DO;  Location: [place];  Service: Plastics;  Laterality: N/A;  . PERIPHERAL ARTERIAL STENT GRAFT Left      Prior to Admission medications   Medication Sig Start Date End Date Taking? Authorizing Provider  albuterol (PROVENTIL HFA;VENTOLIN HFA) 108 (90 Base) MCG/ACT inhaler Inhale 2 puffs into the lungs every 6 (six) hours as needed for wheezing or shortness of breath. 04/21/17   Lisa Roca, MD  alum & mag hydroxide-simeth (MAALOX/MYLANTA) 200-200-20 MG/5ML suspension Take 15 mLs every 4 (four) hours as needed by mouth for indigestion or heartburn. Patient not taking: Reported on 04/19/2017 01/25/17   Demetrios Loll, MD  atorvastatin (LIPITOR) 40 MG tablet Take 1 tablet (40 mg total) by mouth daily. 11/16/16   Vaughan Basta, MD  budesonide-formoterol (SYMBICORT) 160-4.5 MCG/ACT inhaler Inhale 2 puffs into the lungs daily. 11/16/16   Vaughan Basta, MD  calcium acetate (PHOSLO) 667 MG capsule Take 2,001 mg by mouth 3 (three) times daily.    [provider]  doxycycline (VIBRA-TABS) 100 MG tablet Take 1 tablet (100 mg total) by mouth 2 (two) times daily. 07/10/17   Harvest Dark, MD  ferrous sulfate 325 (65 FE) MG tablet Take 1 tablet by  mouth 3 (three) times daily. 01/21/17   [provider]  furosemide (LASIX) 80 MG tablet Take 1 tablet (80 mg total) daily by mouth. 01/25/17   Demetrios Loll, MD  gabapentin (NEURONTIN) 300 MG capsule Take 1 capsule by mouth daily as directed 08/16/16   [provider]  ipratropium-albuterol (DUONEB) 0.5-2.5 (3) MG/3ML SOLN Take 3 mLs by nebulization every 6 (six) hours as needed. 11/28/16   Loletha Grayer, MD  labetalol (NORMODYNE) 100 MG tablet Take 100 mg by mouth daily. 03/24/17   [provider]  Multiple Vitamins-Minerals-FA (DIALYVITE SUPREME D) 3 MG TABS Take 1 tablet (3 mg total) by mouth daily. 11/28/16   Loletha Grayer, MD  nitroGLYCERIN (NITROSTAT) 0.4 MG SL tablet Place 1 tablet (0.4 mg total) under the tongue every 5 (five) minutes as needed. 04/12/16   Wende Bushy, MD  pantoprazole (PROTONIX) 40 MG tablet Take 1 tablet (40 mg total) by mouth daily. 11/28/16   Loletha Grayer, MD  Spacer/Aero Chamber Mouthpiece MISC 1 Units by Does not apply route every 4 (four) hours as needed (wheezing). 02/19/17   Rifenbark,  Milta Deiters, MD  tiotropium (SPIRIVA HANDIHALER) 18 MCG inhalation capsule Place 1 capsule (18 mcg total) into inhaler and inhale daily. 11/16/16 11/16/17  Vaughan Basta, MD     Allergies Patient has no known allergies.   Family History  Problem Relation Age of Onset  . Colon cancer Mother   . Cancer Father   . Cancer Sister   . Kidney disease Brother     Social History Social History   Tobacco Use  . Smoking status: Current Every Day Smoker    Packs/day: 0.15    Years: 40.00    Pack years: 6.00    Types: Cigarettes  . Smokeless tobacco: Never Used  Substance Use Topics  . Alcohol use: No    Comment: pt reports quitting after learning about cirrhosis  . Drug use: No    Frequency: 7.0 times per week    Types: Marijuana, Cocaine    Review of Systems  Constitutional:   No fever or chills.  Cardiovascular:   No chest pain or  syncope. Respiratory:   No dyspnea or cough. Gastrointestinal:   Negative for abdominal pain, vomiting and diarrhea.  Musculoskeletal:   Negative for focal pain or swelling All other systems reviewed and are negative except as documented above in ROS and HPI.  ____________________________________________   PHYSICAL EXAM:  VITAL SIGNS: ED Triage Vitals  Enc Vitals Group     BP 08/23/17 1123 113/86     Pulse Rate 08/23/17 1123 76     Resp 08/23/17 1123 20     Temp 08/23/17 1123 98.2 F (36.8 C)     Temp Source 08/23/17 1123 Oral     SpO2 08/23/17 1123 100 %     Weight 08/23/17 1124 160 lb (72.6 kg)     Height 08/23/17 1124 6\' 3"  (1.905 m)     Head Circumference --      Peak Flow --      Pain Score 08/23/17 1124 0     Pain Loc --      Pain Edu? --      Excl. in [place]? --     Vital signs reviewed, nursing assessments reviewed.   Constitutional:   Alert and oriented. Non-toxic appearance. Eyes:   Conjunctivae are normal. EOMI. ENT      Head:   Normocephalic and atraumatic.          Mouth/Throat:   MMM, no pharyngeal erythema. No peritonsillar mass.       Neck:   No meningismus. Full ROM.  No JVD Hematological/Lymphatic/Immunilogical:   No cervical lymphadenopathy. Cardiovascular:   RRR. Symmetric bilateral radial and DP pulses.  No murmurs.  Respiratory:   Normal respiratory effort without tachypnea/retractions. Breath sounds are clear and equal bilaterally. No wheezes/rales/rhonchi. Gastrointestinal:   Soft and nontender. Non distended. There is no CVA tenderness.  No rebound, rigidity, or guarding.  Paracentesis site not bleeding or oozing.  Nontender  Musculoskeletal:   Normal range of motion in all extremities. No joint effusions.  No lower extremity tenderness.  No edema. Neurologic:   Normal speech and language.  Motor grossly intact. No acute focal neurologic deficits are appreciated.   ____________________________________________    LABS (pertinent  positives/negatives) (all labs ordered are listed, but only abnormal results are displayed) Labs Reviewed  CBC  RENAL FUNCTION PANEL   ____________________________________________   EKG    ____________________________________________    RADIOLOGY  US Paracentesis  Result Date: 08/23/2017 INDICATION: Recurrent ascites, abdominal distension EXAM: ULTRASOUND GUIDED LEFT PARACENTESIS  MEDICATIONS: 1% lidocaine local COMPLICATIONS: None immediate. PROCEDURE: Informed written consent was obtained from the patient after a discussion of the risks, benefits and alternatives to treatment. A timeout was performed prior to the initiation of the procedure. Initial ultrasound scanning demonstrates a large amount of ascites within the left lower abdominal quadrant. The right lower abdomen was prepped and draped in the usual sterile fashion. 1% lidocaine with epinephrine was used for local anesthesia. Following this, a 6 Fr Safe-T-Centesis catheter was introduced. An ultrasound image was saved for documentation purposes. The paracentesis was performed. The catheter was removed and a dressing was applied. The patient tolerated the procedure well without immediate post procedural complication. FINDINGS: A total of approximately 5.2 L of clear peritoneal fluid was removed. Sample was not sent for laboratory analysis IMPRESSION: Successful ultrasound-guided paracentesis yielding 5.2 liters of peritoneal fluid. Electronically Signed   By: Jerilynn Mages.  Shick M.D.   On: 08/23/2017 11:05    ____________________________________________   PROCEDURES Procedures  ____________________________________________    CLINICAL IMPRESSION / ASSESSMENT AND PLAN / ED COURSE  Pertinent labs & imaging results that were available during my care of the patient were reviewed by me and considered in my medical decision making (see chart for details).    Patient well-appearing, nontoxic, normal vitals, no acute symptoms, presents for  routine dialysis.  Case discussed with nephrology who will arrange dialysis due to the patient's lack of any available outpatient dialysis center.  He is medically stable at this time.      ____________________________________________   FINAL CLINICAL IMPRESSION(S) / ED DIAGNOSES    Final diagnoses:  [name] [facility])     ED Discharge Orders    None      Portions of this note were generated with dragon dictation software. Dictation errors may occur despite best attempts at proofreading.    Carrie Mew, MD 08/23/17 1215

## 2017-08-23 NOTE — ED Notes (Signed)
Pt left prior to receiving discharge paperwork, pt did not sign e-signature for discharge

## 2017-08-23 NOTE — ED Triage Notes (Signed)
PT arrived for scheduled paracentesis today at 1030

## 2017-08-23 NOTE — Progress Notes (Signed)
[facility]  ROUNDING NOTE   Subjective:   Patient presents to the emergency room with tense ascites and shortness of breath Requesting paracentesis 5.2 L removed with paracentesis Now seen during HD   HEMODIALYSIS FLOWSHEET:  Blood Flow Rate (mL/min): 400 mL/min Arterial Pressure (mmHg): -180 mmHg Venous Pressure (mmHg): 130 mmHg Transmembrane Pressure (mmHg): 80 mmHg Ultrafiltration Rate (mL/min): 1280 mL/min Dialysate Flow Rate (mL/min): 800 ml/min Conductivity: Machine : 14.2 Conductivity: Machine : 14.2 Dialysis Fluid Bolus: Normal Saline Bolus Amount (mL): 250 mL    Objective:  Vital signs in last 24 hours:  Temp:  [97.6 F (36.4 C)-98.4 F (36.9 C)] 98.4 F (36.9 C) (06/12 1515) Pulse Rate:  [74-108] 82 (06/12 1518) Resp:  [13-20] 17 (06/12 1518) BP: (107-130)/(55-94) 119/58 (06/12 1515) SpO2:  [97 %-100 %] 100 % (06/12 1518) Weight:  [70.5 kg (155 lb 6.8 oz)-72.6 kg (160 lb)] 70.5 kg (155 lb 6.8 oz) (06/12 1515)  Weight change:  Filed Weights   08/23/17 1124 08/23/17 1133 08/23/17 1515  Weight: 72.6 kg (160 lb) 72.5 kg (159 lb 13.3 oz) 70.5 kg (155 lb 6.8 oz)    Intake/Output: No intake/output data recorded.   Intake/Output this shift:  Total I/O In: -  Out: 3827 [Other:3827]  Physical Exam: General: NAD  Head: Normocephalic, atraumatic. Moist oral mucosal membranes  Eyes: Anicteric  Neck: Supple  Lungs:  normal effort,  diffuse bilateral rales  Heart: Regular rate and rhythm  Abdomen:  Soft, nontender, from tense ascites distended   Extremities: + peripheral edema.  Neurologic: Nonfocal, moving all four extremities  Skin: No lesions  Access: R IJ permcath    Basic Metabolic Panel: Recent Labs  Lab 08/18/17 1445 08/21/17 1502 08/23/17 1213  NA 136 137 138  K >7.5* 7.0* 6.2*  CL 104 103 101  CO2 20* 23 25  GLUCOSE 98 84 59*  BUN 67* 51* 35*  CREATININE 9.17* 9.47* 8.37*  CALCIUM 9.7 8.6* 8.5*  PHOS  --  4.7* 4.8*     Liver Function Tests: Recent Labs  Lab 08/18/17 1445 08/21/17 1502 08/23/17 1213  AST 27  --   --   ALT 27  --   --   ALKPHOS 188*  --   --   BILITOT 0.5  --   --   PROT 7.3  --   --   ALBUMIN 3.0* 2.8* 2.8*   No results for input(s): LIPASE, AMYLASE in the last 168 hours. No results for input(s): AMMONIA in the last 168 hours.  CBC: Recent Labs  Lab 08/18/17 1445 08/21/17 1502 08/23/17 1213  WBC 6.4 6.0 5.0  NEUTROABS 4.3  --   --   HGB 9.6* 9.3* 9.4*  HCT 31.2* 31.6* 31.0*  MCV 80.2 81.8 80.7  PLT 294 269 238    Cardiac Enzymes: No results for input(s): CKTOTAL, CKMB, CKMBINDEX, TROPONINI in the last 168 hours.  BNP: Invalid input(s): POCBNP  CBG: No results for input(s): GLUCAP in the last 168 hours.  Microbiology: Results for orders placed or performed during the hospital encounter of 07/12/17  Culture, blood (Routine X 2) w Reflex to ID Panel     Status: None   Collection Time: 07/12/17  4:43 PM  Result Value Ref Range Status   Specimen Description BLOOD LINE CENTRAL  Final   Special Requests   Final    BOTTLES DRAWN AEROBIC AND ANAEROBIC Blood Culture adequate volume   Culture   Final    NO GROWTH  5 DAYS Performed at [facility], [address] [place], [place] [address]    Report Status 07/17/2017 FINAL  Final    Coagulation Studies: No results for input(s): LABPROT, INR in the last 72 hours.  Urinalysis: No results for input(s): COLORURINE, LABSPEC, PHURINE, GLUCOSEU, HGBUR, BILIRUBINUR, KETONESUR, PROTEINUR, UROBILINOGEN, NITRITE, LEUKOCYTESUR in the last 72 hours.  Invalid input(s): APPERANCEUR    Imaging: US Paracentesis  Result Date: 08/23/2017 INDICATION: Recurrent ascites, abdominal distension EXAM: ULTRASOUND GUIDED LEFT PARACENTESIS MEDICATIONS: 1% lidocaine local COMPLICATIONS: None immediate. PROCEDURE: Informed written consent was obtained from the patient after a discussion of the risks, benefits and alternatives  to treatment. A timeout was performed prior to the initiation of the procedure. Initial ultrasound scanning demonstrates a large amount of ascites within the left lower abdominal quadrant. The right lower abdomen was prepped and draped in the usual sterile fashion. 1% lidocaine with epinephrine was used for local anesthesia. Following this, a 6 Fr Safe-T-Centesis catheter was introduced. An ultrasound image was saved for documentation purposes. The paracentesis was performed. The catheter was removed and a dressing was applied. The patient tolerated the procedure well without immediate post procedural complication. FINDINGS: A total of approximately 5.2 L of clear peritoneal fluid was removed. Sample was not sent for laboratory analysis IMPRESSION: Successful ultrasound-guided paracentesis yielding 5.2 liters of peritoneal fluid. Electronically Signed   By: Jerilynn Mages.  Shick M.D.   On: 08/23/2017 11:05     Medications:       Assessment/ Plan:  [name] is a 57 y.o. black male withend stage renal disease on hemodialysis secondary to Alport's syndrome, ascites, hypertension, anemia of chronic kidney disease, coronary artery disease, peripheral vascular disease, hyperlipidemia, gastrointestinal AVMs, pulmonary hypertension  nooutpatientdialysis unit currently  1. Acute hyperkalemia Urgent hemodialysis today    2.  End-stage renal disease:  Hemodialysis today. Outpatient planning is ongoing.  Using CVC. Will need follow up with vascular for AVF.   3. Tense ascites Paracentesis done 6/12; 5.2 L removed   LOS: 0 Stanley [name] 6/12/20193:22 PM

## 2017-08-23 NOTE — ED Notes (Signed)
Pt currently in dialysis

## 2017-08-23 NOTE — Progress Notes (Signed)
HD tx start    08/23/17 1146  Vital Signs  Pulse Rate (!) 108  Pulse Rate Source Monitor  Resp 13  BP 111/85  BP Location Left Arm  BP Method Automatic  Patient Position (if appropriate) Sitting  Oxygen Therapy  SpO2 100 %  O2 Device Room Air  During Hemodialysis Assessment  Blood Flow Rate (mL/min) 400 mL/min  Arterial Pressure (mmHg) -160 mmHg  Venous Pressure (mmHg) 160 mmHg  Transmembrane Pressure (mmHg) 60 mmHg  Ultrafiltration Rate (mL/min) 1290 mL/min  Dialysate Flow Rate (mL/min) 800 ml/min  Conductivity: Machine  14.2  HD Safety Checks Performed Yes  Dialysis Fluid Bolus Normal Saline  Bolus Amount (mL) 250 mL  Intra-Hemodialysis Comments Tx initiated

## 2017-08-25 ENCOUNTER — Encounter: Payer: Self-pay | Admitting: Emergency Medicine

## 2017-08-25 ENCOUNTER — Other Ambulatory Visit: Payer: Self-pay

## 2017-08-25 ENCOUNTER — Emergency Department
Admission: EM | Admit: 2017-08-25 | Discharge: 2017-08-25 | Disposition: A | Discharge status: Home / Self Care | Payer: Medicare Other | Attending: Emergency Medicine | Admitting: Emergency Medicine

## 2017-08-25 DIAGNOSIS — I509 Heart failure, unspecified: Secondary | ICD-10-CM | POA: Insufficient documentation

## 2017-08-25 DIAGNOSIS — Z79899 Other long term (current) drug therapy: Secondary | ICD-10-CM | POA: Diagnosis not present

## 2017-08-25 DIAGNOSIS — Z4931 Encounter for adequacy testing for hemodialysis: Secondary | ICD-10-CM | POA: Diagnosis not present

## 2017-08-25 DIAGNOSIS — Z992 Dependence on renal dialysis: Secondary | ICD-10-CM | POA: Diagnosis not present

## 2017-08-25 DIAGNOSIS — E875 Hyperkalemia: Secondary | ICD-10-CM | POA: Diagnosis not present

## 2017-08-25 DIAGNOSIS — I132 Hypertensive heart and chronic kidney disease with heart failure and with stage 5 chronic kidney disease, or end stage renal disease: Secondary | ICD-10-CM | POA: Insufficient documentation

## 2017-08-25 DIAGNOSIS — I259 Chronic ischemic heart disease, unspecified: Secondary | ICD-10-CM | POA: Insufficient documentation

## 2017-08-25 DIAGNOSIS — F17210 Nicotine dependence, cigarettes, uncomplicated: Secondary | ICD-10-CM | POA: Insufficient documentation

## 2017-08-25 DIAGNOSIS — R188 Other ascites: Secondary | ICD-10-CM | POA: Diagnosis not present

## 2017-08-25 DIAGNOSIS — N186 End stage renal disease: Secondary | ICD-10-CM | POA: Insufficient documentation

## 2017-08-25 MED ORDER — DIPHENHYDRAMINE HCL 50 MG/ML IJ SOLN
12.5000 mg | Freq: Once | INTRAMUSCULAR | Status: AC
  Administered 2017-08-25: 12.5 mg via INTRAVENOUS

## 2017-08-25 MED ORDER — CHLORHEXIDINE GLUCONATE CLOTH 2 % EX PADS
6.0000 | MEDICATED_PAD | Freq: Every day | CUTANEOUS | Status: DC
  Administered 2017-08-25: 6 via TOPICAL
  Filled 2017-08-25: qty 6

## 2017-08-25 NOTE — ED Triage Notes (Signed)
Pt to ED for dialysis treatment. Pt has no complaints at this time. Last dialysis on Wednesday.

## 2017-08-25 NOTE — ED Provider Notes (Signed)
[facility] Emergency Department Provider Note   ____________________________________________    I have reviewed the triage vital signs and the nursing notes.   HISTORY  Chief Complaint Dialysis     HPI Stanley [name] is a 57 y.o. male who presents to the ED for dialysis.  Patient comes to the ED for his regularly scheduled dialysis because he has been kicked out of surrounding dialysis facilities. he has no complaints today   Past Medical History:  Diagnosis Date  . Alcohol abuse   . CHF (congestive heart failure) ([place])   . Cirrhosis ([place])   . Coronary artery disease 2009  . Drug abuse ([place])   . End stage renal disease on dialysis [facility]) NEPHROLOGIST-   DR [facility]  IN [place]   HEMODIALYSIS --   TUES/  THURS/  SAT  . Gastrointestinal bleed 06/13/2017   From chart...hx of multiple GI bleeds  . GERD (gastroesophageal reflux disease)   . Hyperlipidemia   . Hypertension   . PAD (peripheral artery disease) ([place])   . Renal insufficiency    Per pt, 32 oz fluid restriction per day  . S/P triple vessel bypass 06/09/2016   2009ish  . Suicidal ideation    & HOMICIDAL IDEATION --  06-16-2013   ADMITTED TO BEHAVIOR HEALTH    Patient Active Problem List   Diagnosis Date Noted  . ESRD (end stage renal disease) on dialysis ([place]) 07/28/2017  . Protein-calorie malnutrition, severe 06/14/2017  . Encounter for dialysis [facility])   . Palliative care by specialist   . Goals of care, counseling/discussion   . Malnutrition of moderate degree 06/05/2017  . Secondary esophageal varices without bleeding ([place])   . Stomach irritation   . Idiopathic esophageal varices without bleeding ([place])   . Alcoholic hepatitis with ascites 05/24/2017  . ESRD (end stage renal disease) ([place]) 04/28/2017  . Uremia 03/08/2017  . ESRD on hemodialysis ([place]) 03/03/2017  . Weakness 02/28/2017  . Hypocalcemia 02/22/2017  . Shortness of breath 11/26/2016  . COPD (chronic  obstructive pulmonary disease) ([place]) 10/30/2016  . COPD exacerbation ([place]) 10/29/2016  . Anemia   . Heme positive stool   . Ulceration of intestine   . Benign neoplasm of transverse colon   . Acute gastrointestinal hemorrhage   . Esophageal candidiasis ([place])   . Angiodysplasia of intestinal tract   . Acute respiratory failure with hypoxia ([place]) 07/03/2016  . GI bleeding 06/24/2016  . Rectal bleeding 06/14/2016  . Anemia of chronic disease 06/01/2016  . MRSA carrier 06/01/2016  . Chronic renal failure 05/23/2016  . Ischemic heart disease 05/23/2016  . Angiodysplasia of small intestine   . Melena   . Small bowel bleed not requiring more than 4 units of blood in 24 hours, ICU, or surgery   . Anemia due to chronic blood loss   . Abdominal pain 05/05/2016  . Acute posthemorrhagic anemia 04/17/2016  . Gastrointestinal bleed 04/17/2016  . History of esophagogastroduodenoscopy (EGD) 04/17/2016  . Elevated troponin 04/17/2016  . Alcohol abuse 04/17/2016  . Upper GI bleed 01/19/2016  . Blood in stool   . Angiodysplasia of stomach and duodenum with hemorrhage   . Gastritis   . Reflux esophagitis   . GI bleed 05/16/2015  . Acute GI bleeding   . Symptomatic anemia 04/30/2015  . HTN (hypertension) 04/06/2015  . GERD (gastroesophageal reflux disease) 04/06/2015  . HLD (hyperlipidemia) 04/06/2015  . Dyspnea 04/06/2015  . Cirrhosis of liver with ascites ([place]) 04/06/2015  .  Ascites 04/06/2015  . GIB (gastrointestinal bleeding) 03/23/2015  . Homicidal ideation 06/19/2013  . Suicidal intent 06/19/2013  . Homicidal ideations 06/19/2013  . Hyperkalemia 06/16/2013  . Mandible fracture ([place]) 06/05/2013  . Fracture, mandible ([place]) 06/02/2013  . Coronary atherosclerosis of native coronary artery 06/02/2013  . ESRD on dialysis ([place]) 06/02/2013  . Mandible open fracture ([place]) 06/02/2013    Past Surgical History:  Procedure Laterality Date  . A/V FISTULAGRAM Right 06/06/2017   Procedure: A/V  FISTULAGRAM;  Surgeon: Katha Cabal, MD;  Location: [place] CV LAB;  Service: Cardiovascular;  Laterality: Right;  . A/V SHUNT INTERVENTION N/A 06/06/2017   Procedure: A/V SHUNT INTERVENTION;  Surgeon: Katha Cabal, MD;  Location: [place] CV LAB;  Service: Cardiovascular;  Laterality: N/A;  . AGILE CAPSULE N/A 06/19/2016   Procedure: AGILE CAPSULE;  Surgeon: [name], MD;  Location: [facility] ENDOSCOPY;  Service: Endoscopy;  Laterality: N/A;  . COLONOSCOPY WITH PROPOFOL N/A 06/18/2016   Procedure: COLONOSCOPY WITH PROPOFOL;  Surgeon: [name], MD;  Location: [facility] ENDOSCOPY;  Service: Endoscopy;  Laterality: N/A;  . COLONOSCOPY WITH PROPOFOL N/A 08/12/2016   Procedure: COLONOSCOPY WITH PROPOFOL;  Surgeon: [name], MD;  Location: [facility] ENDOSCOPY;  Service: Endoscopy;  Laterality: N/A;  . COLONOSCOPY WITH PROPOFOL N/A 05/05/2017   Procedure: COLONOSCOPY WITH PROPOFOL;  Surgeon: Manya Silvas, MD;  Location: [facility] ENDOSCOPY;  Service: Endoscopy;  Laterality: N/A;  . CORONARY ANGIOPLASTY  ?   PT UNABLE TO TELL IF  BEFORE OR AFTER  CABG  . CORONARY ARTERY BYPASS GRAFT  2008  ([place] , )   3 VESSEL  . DIALYSIS FISTULA CREATION  LAST SURGERY  APPOX  2008  . ENTEROSCOPY N/A 05/10/2016   Procedure: ENTEROSCOPY;  Surgeon: Jerene Bears, MD;  Location: [place];  Service: Gastroenterology;  Laterality: N/A;  . ENTEROSCOPY N/A 08/12/2016   Procedure: ENTEROSCOPY;  Surgeon: [name], MD;  Location: [facility] ENDOSCOPY;  Service: Endoscopy;  Laterality: N/A;  . ENTEROSCOPY Left 06/03/2017   Procedure: ENTEROSCOPY;  Surgeon: Virgel Manifold, MD;  Location: [facility] ENDOSCOPY;  Service: Endoscopy;  Laterality: Left;  Procedure date will ultimately depend on when patient is medically optimized before the procedure, pending hemodialysis and blood transfusions etc. Will place on schedule and change depending on clinical status.   . ENTEROSCOPY N/A 06/05/2017   Procedure: ENTEROSCOPY;   Surgeon: Virgel Manifold, MD;  Location: [facility] ENDOSCOPY;  Service: Endoscopy;  Laterality: N/A;  . ENTEROSCOPY N/A 06/15/2017   Procedure: Push ENTEROSCOPY;  Surgeon: [name], MD;  Location: [facility] ENDOSCOPY;  Service: Endoscopy;  Laterality: N/A;  . ESOPHAGOGASTRODUODENOSCOPY N/A 05/07/2015   Procedure: ESOPHAGOGASTRODUODENOSCOPY (EGD);  Surgeon: Hulen Luster, MD;  Location: [facility] ENDOSCOPY;  Service: Endoscopy;  Laterality: N/A;  . ESOPHAGOGASTRODUODENOSCOPY (EGD) WITH PROPOFOL N/A 05/17/2015   Procedure: ESOPHAGOGASTRODUODENOSCOPY (EGD) WITH PROPOFOL;  Surgeon: [name], MD;  Location: [facility] ENDOSCOPY;  Service: Endoscopy;  Laterality: N/A;  . ESOPHAGOGASTRODUODENOSCOPY (EGD) WITH PROPOFOL N/A 01/20/2016   Procedure: ESOPHAGOGASTRODUODENOSCOPY (EGD) WITH PROPOFOL;  Surgeon: [name], MD;  Location: [facility] ENDOSCOPY;  Service: Endoscopy;  Laterality: N/A;  . ESOPHAGOGASTRODUODENOSCOPY (EGD) WITH PROPOFOL N/A 04/17/2016   Procedure: ESOPHAGOGASTRODUODENOSCOPY (EGD) WITH PROPOFOL;  Surgeon: Lin Landsman, MD;  Location: [facility] ENDOSCOPY;  Service: Gastroenterology;  Laterality: N/A;  . ESOPHAGOGASTRODUODENOSCOPY (EGD) WITH PROPOFOL  05/09/2016   Procedure: ESOPHAGOGASTRODUODENOSCOPY (EGD) WITH PROPOFOL;  Surgeon: Jerene Bears, MD;  Location: [place] ENDOSCOPY;  Service: Endoscopy;;  . ESOPHAGOGASTRODUODENOSCOPY (EGD) WITH PROPOFOL N/A  06/16/2016   Procedure: ESOPHAGOGASTRODUODENOSCOPY (EGD) WITH PROPOFOL;  Surgeon: [name], MD;  Location: [facility] ENDOSCOPY;  Service: Endoscopy;  Laterality: N/A;  . ESOPHAGOGASTRODUODENOSCOPY (EGD) WITH PROPOFOL N/A 05/05/2017   Procedure: ESOPHAGOGASTRODUODENOSCOPY (EGD) WITH PROPOFOL;  Surgeon: Manya Silvas, MD;  Location: [facility] ENDOSCOPY;  Service: Endoscopy;  Laterality: N/A;  . ESOPHAGOGASTRODUODENOSCOPY (EGD) WITH PROPOFOL N/A 06/15/2017   Procedure: ESOPHAGOGASTRODUODENOSCOPY (EGD) WITH PROPOFOL;  Surgeon: [name], MD;  Location: [facility] ENDOSCOPY;  Service:  Endoscopy;  Laterality: N/A;  . GIVENS CAPSULE STUDY N/A 05/07/2016   Procedure: GIVENS CAPSULE STUDY;  Surgeon: Doran Stabler, MD;  Location: [place];  Service: Endoscopy;  Laterality: N/A;  . MANDIBULAR HARDWARE REMOVAL N/A 07/29/2013   Procedure: REMOVAL OF ARCH BARS;  Surgeon: Theodoro Kos, DO;  Location: [place];  Service: Plastics;  Laterality: N/A;  . ORIF MANDIBULAR FRACTURE N/A 06/05/2013   Procedure: REPAIR OF MANDIBULAR FRACTURE x 2 with maxillo-mandibular fixation ;  Surgeon: Theodoro Kos, DO;  Location: [place];  Service: Plastics;  Laterality: N/A;  . PERIPHERAL ARTERIAL STENT GRAFT Left     Prior to Admission medications   Medication Sig Start Date End Date Taking? Authorizing Provider  albuterol (PROVENTIL HFA;VENTOLIN HFA) 108 (90 Base) MCG/ACT inhaler Inhale 2 puffs into the lungs every 6 (six) hours as needed for wheezing or shortness of breath. 04/21/17   Lisa Roca, MD  alum & mag hydroxide-simeth (MAALOX/MYLANTA) 200-200-20 MG/5ML suspension Take 15 mLs every 4 (four) hours as needed by mouth for indigestion or heartburn. Patient not taking: Reported on 04/19/2017 01/25/17   Demetrios Loll, MD  atorvastatin (LIPITOR) 40 MG tablet Take 1 tablet (40 mg total) by mouth daily. 11/16/16   Vaughan Basta, MD  budesonide-formoterol (SYMBICORT) 160-4.5 MCG/ACT inhaler Inhale 2 puffs into the lungs daily. 11/16/16   Vaughan Basta, MD  calcium acetate (PHOSLO) 667 MG capsule Take 2,001 mg by mouth 3 (three) times daily.    [provider]  doxycycline (VIBRA-TABS) 100 MG tablet Take 1 tablet (100 mg total) by mouth 2 (two) times daily. 07/10/17   Harvest Dark, MD  ferrous sulfate 325 (65 FE) MG tablet Take 1 tablet by mouth 3 (three) times daily. 01/21/17   [provider]  furosemide (LASIX) 80 MG tablet Take 1 tablet (80 mg total) daily by mouth. 01/25/17   Demetrios Loll, MD  gabapentin (NEURONTIN) 300 MG capsule Take 1 capsule  by mouth daily as directed 08/16/16   [provider]  ipratropium-albuterol (DUONEB) 0.5-2.5 (3) MG/3ML SOLN Take 3 mLs by nebulization every 6 (six) hours as needed. 11/28/16   Loletha Grayer, MD  labetalol (NORMODYNE) 100 MG tablet Take 100 mg by mouth daily. 03/24/17   [provider]  Multiple Vitamins-Minerals-FA (DIALYVITE SUPREME D) 3 MG TABS Take 1 tablet (3 mg total) by mouth daily. 11/28/16   Loletha Grayer, MD  nitroGLYCERIN (NITROSTAT) 0.4 MG SL tablet Place 1 tablet (0.4 mg total) under the tongue every 5 (five) minutes as needed. 04/12/16   Wende Bushy, MD  pantoprazole (PROTONIX) 40 MG tablet Take 1 tablet (40 mg total) by mouth daily. 11/28/16   Loletha Grayer, MD  Spacer/Aero Chamber Mouthpiece MISC 1 Units by Does not apply route every 4 (four) hours as needed (wheezing). 02/19/17   Darel Hong, MD  tiotropium (SPIRIVA HANDIHALER) 18 MCG inhalation capsule Place 1 capsule (18 mcg total) into inhaler and inhale daily. 11/16/16 11/16/17  Vaughan Basta, MD     Allergies Patient has  no known allergies.  Family History  Problem Relation Age of Onset  . Colon cancer Mother   . Cancer Father   . Cancer Sister   . Kidney disease Brother     Social History Social History   Tobacco Use  . Smoking status: Current Every Day Smoker    Packs/day: 0.15    Years: 40.00    Pack years: 6.00    Types: Cigarettes  . Smokeless tobacco: Never Used  Substance Use Topics  . Alcohol use: No    Comment: pt reports quitting after learning about cirrhosis  . Drug use: No    Frequency: 7.0 times per week    Types: Marijuana, Cocaine    Review of Systems  Constitutional: No fever/chills  Respiratory: No shortness of breath   Gastrointestinal: No abdominal pain.   Neurological: Negative for headaches     ____________________________________________   PHYSICAL EXAM:  VITAL SIGNS: ED Triage Vitals  Enc Vitals Group     BP 08/25/17 1149 115/77       Pulse Rate 08/25/17 1149 84     Resp 08/25/17 1149 16     Temp 08/25/17 1149 98 F (36.7 C)     Temp Source 08/25/17 1149 Oral     SpO2 08/25/17 1149 99 %     Weight --      Height --      Head Circumference --      Peak Flow --      Pain Score 08/25/17 1141 0     Pain Loc --      Pain Edu? --      Excl. in [place]? --     Constitutional: Alert and oriented. No acute distress.   Cardiovascular: Normal rate, regular rhythm.  Respiratory: Normal respiratory effort.  No retractions.  Clear to auscultation bilaterally   Neurologic:  Normal speech and language. No gross focal neurologic deficits are appreciated.   Skin:  Skin is warm, dry and intact. No rash noted.   ____________________________________________   LABS (all labs ordered are listed, but only abnormal results are displayed)  Labs Reviewed - No data to display ____________________________________________  EKG   ____________________________________________  RADIOLOGY  None ____________________________________________   PROCEDURES  Procedure(s) performed: No  Procedures   Critical Care performed: No ____________________________________________   INITIAL IMPRESSION / ASSESSMENT AND PLAN / ED COURSE  Pertinent labs & imaging results that were available during my care of the patient were reviewed by me and considered in my medical decision making (see chart for details).  Patient here for dialysis, no physical complaints.   ____________________________________________   FINAL CLINICAL IMPRESSION(S) / ED DIAGNOSES  Final diagnoses:  Encounter for dialysis [facility])      NEW MEDICATIONS STARTED DURING THIS VISIT:  New Prescriptions   No medications on file     Note:  This document was prepared using Dragon voice recognition software and may include unintentional dictation errors.    Lavonia Drafts, MD 08/25/17 [phone]

## 2017-08-25 NOTE — ED Provider Notes (Signed)
Patient is back from dialysis now.  He is ready to go home.   Merlyn Lot, MD 08/25/17 [phone]

## 2017-08-25 NOTE — Progress Notes (Signed)
[facility]  ROUNDING NOTE   Subjective:    patient presented to ER for fluid overload Potassium 6.2 on 6/12 Denies acute c/o + LE edema   Objective:  Vital signs in last 24 hours:  Temp:  [97.6 F (36.4 C)-98.3 F (36.8 C)] 98.3 F (36.8 C) (06/14 1715) Pulse Rate:  [78-89] 83 (06/14 1715) Resp:  [14-20] 17 (06/14 1715) BP: (114-148)/(63-80) 131/68 (06/14 1715) SpO2:  [99 %-100 %] 100 % (06/14 1715)  Weight change:  There were no vitals filed for this visit.  Intake/Output: No intake/output data recorded.   Intake/Output this shift:  Total I/O In: -  Out: 5409 [Other:3254]  Physical Exam: General: NAD  Head: Normocephalic, atraumatic. Moist oral mucosal membranes  Eyes: Anicteric  Neck: Supple  Lungs:  normal effort,  Mild crackles  Heart: Regular rate and rhythm  Abdomen:  Soft, nontender, distended from ascites  Extremities: + peripheral edema.  Neurologic: Nonfocal, moving all four extremities  Skin: No lesions  Access: R IJ permcath    Basic Metabolic Panel: Recent Labs  Lab 08/21/17 1502 08/23/17 1213  NA 137 138  K 7.0* 6.2*  CL 103 101  CO2 23 25  GLUCOSE 84 59*  BUN 51* 35*  CREATININE 9.47* 8.37*  CALCIUM 8.6* 8.5*  PHOS 4.7* 4.8*    Liver Function Tests: Recent Labs  Lab 08/21/17 1502 08/23/17 1213  ALBUMIN 2.8* 2.8*   No results for input(s): LIPASE, AMYLASE in the last 168 hours. No results for input(s): AMMONIA in the last 168 hours.  CBC: Recent Labs  Lab 08/21/17 1502 08/23/17 1213  WBC 6.0 5.0  HGB 9.3* 9.4*  HCT 31.6* 31.0*  MCV 81.8 80.7  PLT 269 238    Cardiac Enzymes: No results for input(s): CKTOTAL, CKMB, CKMBINDEX, TROPONINI in the last 168 hours.  BNP: Invalid input(s): POCBNP  CBG: No results for input(s): GLUCAP in the last 168 hours.  Microbiology: Results for orders placed or performed during the hospital encounter of 07/12/17  Culture, blood (Routine X 2) w Reflex to ID Panel      Status: None   Collection Time: 07/12/17  4:43 PM  Result Value Ref Range Status   Specimen Description BLOOD LINE CENTRAL  Final   Special Requests   Final    BOTTLES DRAWN AEROBIC AND ANAEROBIC Blood Culture adequate volume   Culture   Final    NO GROWTH 5 DAYS Performed at [facility], [address] [place], [place] [address]    Report Status 07/17/2017 FINAL  Final    Coagulation Studies: No results for input(s): LABPROT, INR in the last 72 hours.  Urinalysis: No results for input(s): COLORURINE, LABSPEC, PHURINE, GLUCOSEU, HGBUR, BILIRUBINUR, KETONESUR, PROTEINUR, UROBILINOGEN, NITRITE, LEUKOCYTESUR in the last 72 hours.  Invalid input(s): APPERANCEUR    Imaging: No results found.   Medications:       Assessment/ Plan:  Mr. Nashon Erbes is a 57 y.o. black male withend stage renal disease on hemodialysis secondary to Alport's syndrome, ascites, hypertension, anemia of chronic kidney disease, coronary artery disease, peripheral vascular disease, hyperlipidemia, gastrointestinal AVMs, pulmonary hypertension  nooutpatientdialysis unit currently  1. Hyperkalemia Hemodialysis today  2.  End-stage renal disease:  Hemodialysis today. Outpatient planning is ongoing.  Using CVC. Will need follow up with vascular for AVF.   3. Ascites Paracentesis done 6/12; 5.2 L removed  Patient has meeting with outpatient dialysis unit representatives on Monday for discussion regarding admission to outpatient dialysis  unit   LOS: 0 Kailan Laws 6/14/20196:12 PM

## 2017-08-25 NOTE — ED Notes (Signed)
Pt going to dialysis at this time.

## 2017-08-25 NOTE — ED Notes (Signed)
Pt here for dialysis treatment.

## 2017-08-25 NOTE — Progress Notes (Signed)
Hd completed 

## 2017-08-25 NOTE — ED Notes (Signed)
Esign not working pt verbalized discharge instructions at this time and has no questions.

## 2017-08-25 NOTE — Progress Notes (Signed)
Hd started  

## 2017-08-25 NOTE — ED Notes (Signed)
First Nurse Note: Pt here for dialysis treatment. States that he has not missed any treatments. Pt in NAD. Denies any symptoms at this time.

## 2017-08-28 ENCOUNTER — Encounter: Payer: Self-pay | Admitting: Emergency Medicine

## 2017-08-28 ENCOUNTER — Emergency Department
Admission: EM | Admit: 2017-08-28 | Discharge: 2017-08-28 | Disposition: A | Discharge status: Home / Self Care | Payer: Medicare Other | Attending: Emergency Medicine | Admitting: Emergency Medicine

## 2017-08-28 ENCOUNTER — Other Ambulatory Visit: Payer: Self-pay

## 2017-08-28 DIAGNOSIS — Z79899 Other long term (current) drug therapy: Secondary | ICD-10-CM | POA: Diagnosis not present

## 2017-08-28 DIAGNOSIS — I259 Chronic ischemic heart disease, unspecified: Secondary | ICD-10-CM | POA: Diagnosis not present

## 2017-08-28 DIAGNOSIS — Z992 Dependence on renal dialysis: Secondary | ICD-10-CM | POA: Diagnosis not present

## 2017-08-28 DIAGNOSIS — D631 Anemia in chronic kidney disease: Secondary | ICD-10-CM | POA: Diagnosis not present

## 2017-08-28 DIAGNOSIS — N186 End stage renal disease: Secondary | ICD-10-CM | POA: Diagnosis not present

## 2017-08-28 DIAGNOSIS — E875 Hyperkalemia: Secondary | ICD-10-CM | POA: Diagnosis not present

## 2017-08-28 DIAGNOSIS — I132 Hypertensive heart and chronic kidney disease with heart failure and with stage 5 chronic kidney disease, or end stage renal disease: Secondary | ICD-10-CM | POA: Diagnosis not present

## 2017-08-28 DIAGNOSIS — J449 Chronic obstructive pulmonary disease, unspecified: Secondary | ICD-10-CM | POA: Insufficient documentation

## 2017-08-28 DIAGNOSIS — F17210 Nicotine dependence, cigarettes, uncomplicated: Secondary | ICD-10-CM | POA: Diagnosis not present

## 2017-08-28 DIAGNOSIS — Z4931 Encounter for adequacy testing for hemodialysis: Secondary | ICD-10-CM | POA: Diagnosis present

## 2017-08-28 DIAGNOSIS — I120 Hypertensive chronic kidney disease with stage 5 chronic kidney disease or end stage renal disease: Secondary | ICD-10-CM | POA: Diagnosis not present

## 2017-08-28 DIAGNOSIS — I509 Heart failure, unspecified: Secondary | ICD-10-CM | POA: Diagnosis not present

## 2017-08-28 DIAGNOSIS — R188 Other ascites: Secondary | ICD-10-CM | POA: Diagnosis not present

## 2017-08-28 LAB — RENAL FUNCTION PANEL
Albumin: 2.8 g/dL — ABNORMAL LOW (ref 3.5–5.0)
Anion gap: 13 (ref 5–15)
BUN: 54 mg/dL — ABNORMAL HIGH (ref 6–20)
CALCIUM: 8.8 mg/dL — AB (ref 8.9–10.3)
CO2: 24 mmol/L (ref 22–32)
Chloride: 99 mmol/L — ABNORMAL LOW (ref 101–111)
Creatinine, Ser: 8.55 mg/dL — ABNORMAL HIGH (ref 0.61–1.24)
GFR calc Af Amer: 7 mL/min — ABNORMAL LOW (ref >60)
GFR, EST NON AFRICAN AMERICAN: 6 mL/min — AB (ref >60)
GLUCOSE: 159 mg/dL — AB (ref 65–99)
PHOSPHORUS: 4.3 mg/dL (ref 2.5–4.6)
Potassium: 4.9 mmol/L (ref 3.5–5.1)
Sodium: 136 mmol/L (ref 135–145)

## 2017-08-28 LAB — CBC
HEMATOCRIT: 32.8 % — AB (ref 40.0–52.0)
Hemoglobin: 10 g/dL — ABNORMAL LOW (ref 13.0–18.0)
MCH: 24.7 pg — ABNORMAL LOW (ref 26.0–34.0)
MCHC: 30.5 g/dL — ABNORMAL LOW (ref 32.0–36.0)
MCV: 81 fL (ref 80.0–100.0)
Platelets: 229 10*3/uL (ref 150–440)
RBC: 4.05 MIL/uL — AB (ref 4.40–5.90)
RDW: 21.9 % — ABNORMAL HIGH (ref 11.5–14.5)
WBC: 5.6 10*3/uL (ref 3.8–10.6)

## 2017-08-28 MED ORDER — EPOETIN ALFA 10000 UNIT/ML IJ SOLN
10000.0000 [IU] | INTRAMUSCULAR | Status: DC
  Administered 2017-08-28: 10000 [IU] via INTRAVENOUS
  Filled 2017-08-28: qty 1

## 2017-08-28 MED ORDER — DIPHENHYDRAMINE HCL 50 MG/ML IJ SOLN
25.0000 mg | Freq: Once | INTRAMUSCULAR | Status: AC
  Administered 2017-08-28: 25 mg via INTRAVENOUS

## 2017-08-28 MED ORDER — DIPHENHYDRAMINE HCL 50 MG/ML IJ SOLN
12.5000 mg | Freq: Once | INTRAMUSCULAR | Status: AC
  Administered 2017-08-28: 12.5 mg via INTRAVENOUS

## 2017-08-28 NOTE — ED Notes (Signed)
Report given to Melissa RN in dialysis 

## 2017-08-28 NOTE — ED Notes (Signed)
Esign not working at this time. Pt verbalized discharge instructions and has no questions at this time. 

## 2017-08-28 NOTE — ED Triage Notes (Signed)
Pt here for dialysis, no complaints. Was pulled for meeting with case management prior to triage.

## 2017-08-28 NOTE — Progress Notes (Signed)
Post HD assessment. Pt tolerated tx well without complication. Pt continued to c/o itching throughout tx, MD aware. Net UF 4024, goal met.    08/28/17 1733  Vital Signs  Temp 98.4 F (36.9 C)  Temp Source Oral  Pulse Rate 84  Pulse Rate Source Monitor  Resp 17  BP 112/65  BP Location Left Arm  BP Method Automatic  Patient Position (if appropriate) Sitting  Oxygen Therapy  SpO2 97 %  O2 Device Room Air  Dialysis Weight  Weight 69 kg (152 lb 1.9 oz)  Type of Weight Post-Dialysis  Post-Hemodialysis Assessment  Rinseback Volume (mL) 250 mL  KECN 78.6 V  Dialyzer Clearance Lightly streaked  Duration of HD Treatment -hour(s) 3.5 hour(s)  Hemodialysis Intake (mL) 500 mL  UF Total -Machine (mL) 4524 mL  Net UF (mL) 4024 mL  Tolerated HD Treatment Yes  Education / Care Plan  Dialysis Education Provided Yes  Documented Education in Care Plan Yes  Hemodialysis Catheter Right Internal jugular Double-lumen  No Placement Date or Time found.   Placed prior to admission: Yes  Orientation: Right  Access Location: Internal jugular  Hemodialysis Catheter Type: Double-lumen  Site Condition No complications  Blue Lumen Status Heparin locked  Red Lumen Status Heparin locked  Purple Lumen Status N/A  Catheter fill solution Heparin 1000 units/ml  Catheter fill volume (Arterial) 1.5 cc  Catheter fill volume (Venous) 1.5  Dressing Type Biopatch  Dressing Status Clean;Dry;Intact  Drainage Description None  Post treatment catheter status Capped and Clamped   

## 2017-08-28 NOTE — Progress Notes (Signed)
Post HD assessment    08/28/17 1732  Neurological  Level of Consciousness Alert  Orientation Level Oriented X4  Respiratory  Respiratory Pattern Regular;Unlabored  Chest Assessment Chest expansion symmetrical  Cough Non-productive  Cardiac  ECG Monitor Yes  Vascular  R Radial Pulse +2  L Radial Pulse +2  Edema Generalized  Integumentary  Integumentary (WDL) X  Skin Color Appropriate for ethnicity  Musculoskeletal  Musculoskeletal (WDL) X  Generalized Weakness Yes  Assistive Device None  GU Assessment  Genitourinary (WDL) X  Genitourinary Symptoms  (HD)  Psychosocial  Psychosocial (WDL) WDL

## 2017-08-28 NOTE — Progress Notes (Signed)
Pre HD assessment    08/28/17 1343  Neurological  Level of Consciousness Alert  Orientation Level Oriented X4  Respiratory  Respiratory Pattern Regular;Unlabored  Chest Assessment Chest expansion symmetrical  Cough Non-productive  Cardiac  ECG Monitor Yes  Vascular  R Radial Pulse +2  L Radial Pulse +2  Edema Generalized  Integumentary  Integumentary (WDL) X  Skin Color Appropriate for ethnicity  Musculoskeletal  Musculoskeletal (WDL) X  Generalized Weakness Yes  Assistive Device None  GU Assessment  Genitourinary (WDL) X  Genitourinary Symptoms  (HD)  Psychosocial  Psychosocial (WDL) WDL

## 2017-08-28 NOTE — Progress Notes (Signed)
Pre HD assessment   08/28/17 1342  Vital Signs  Temp 98.3 F (36.8 C)  Temp Source Oral  Pulse Rate 79  Pulse Rate Source Monitor  Resp 18  BP (!) 145/88  BP Location Left Arm  BP Method Automatic  Patient Position (if appropriate) Sitting  Oxygen Therapy  SpO2 98 %  O2 Device Room Air  Pain Assessment  Pain Scale 0-10  Pain Score 0  Dialysis Weight  Weight 70.3 kg (154 lb 15.7 oz)  Type of Weight Pre-Dialysis  Time-Out for Hemodialysis  What Procedure? HD  Pt Identifiers(min of two) First/Last Name;MRN/Account#  Correct Site? Yes  Correct Side? Yes  Correct Procedure? Yes  Consents Verified? Yes  Rad Studies Available? N/A  Safety Precautions Reviewed? Yes  Engineer, civil (consulting) Number  ([id number])  Station Number 1  UF/Alarm Test Passed  Conductivity: Meter 14  Conductivity: Machine  14.1  pH 7.6  Reverse Osmosis main  Normal Saline Lot Number [id number]  Dialyzer Lot Number [id number]  Disposable Set Lot Number [id number]  Machine Temperature 98.6 F (37 C)  Musician and Audible Yes  Blood Lines Intact and Secured Yes  Pre Treatment Patient Checks  Vascular access used during treatment Catheter  Patient is receiving dialysis in a chair Yes  Hepatitis B Surface Antigen Results Negative  Date Hepatitis B Surface Antigen Drawn 06/16/17  Hepatitis B Surface Antibody  (>10)  Date Hepatitis B Surface Antibody Drawn 06/16/17  Hemodialysis Consent Verified Yes  Hemodialysis Standing Orders Initiated Yes  ECG (Telemetry) Monitor On Yes  Prime Ordered Normal Saline  Length of  DialysisTreatment -hour(s) 3.5 Hour(s)  Dialyzer Elisio 17H NR  Dialysate 1K  Dialysis Anticoagulant None  Dialysate Flow Ordered 800  Blood Flow Rate Ordered 400 mL/min  Ultrafiltration Goal 3 Liters  Pre Treatment Labs Renal panel;CBC  Dialysis Blood Pressure Support Ordered Normal Saline  Education / Care Plan  Dialysis Education Provided Yes  Documented Education in Care Plan Yes   Hemodialysis Catheter Right Internal jugular Double-lumen  No Placement Date or Time found.   Placed prior to admission: Yes  Orientation: Right  Access Location: Internal jugular  Hemodialysis Catheter Type: Double-lumen  Site Condition No complications  Blue Lumen Status Heparin locked  Red Lumen Status Heparin locked  Purple Lumen Status N/A  Dressing Type Biopatch  Dressing Status Clean;Dry;Intact  Drainage Description None

## 2017-08-28 NOTE — Progress Notes (Signed)
[facility]  ROUNDING NOTE   Subjective:  Last potassium was high at 6.2.   Patient currently on a 1K bath with HD.  Complains of itching today. Given Benadryl.     Objective:  Vital signs in last 24 hours:  Temp:  [98 F (36.7 C)-98.3 F (36.8 C)] 98.3 F (36.8 C) (06/17 1342) Pulse Rate:  [77-80] 77 (06/17 1415) Resp:  [18-19] 18 (06/17 1415) BP: (123-146)/(73-97) 123/73 (06/17 1415) SpO2:  [97 %-100 %] 98 % (06/17 1415) Weight:  [70.3 kg (154 lb 15.7 oz)-70.3 kg (155 lb)] 70.3 kg (154 lb 15.7 oz) (06/17 1342)  Weight change:  Filed Weights   08/28/17 1219 08/28/17 1342  Weight: 70.3 kg (155 lb) 70.3 kg (154 lb 15.7 oz)    Intake/Output: No intake/output data recorded.   Intake/Output this shift:  No intake/output data recorded.  Physical Exam: General: NAD  Head: Normocephalic, atraumatic. Moist oral mucosal membranes  Eyes: Anicteric  Neck: Supple  Lungs:  normal effort,  Scattered rales  Heart: S1S2 no rubs  Abdomen:  Soft, nontender, distended from ascites  Extremities: 1+ peripheral edema.  Neurologic: Nonfocal, moving all four extremities  Skin: No lesions  Access: R IJ permcath    Basic Metabolic Panel: Recent Labs  Lab 08/21/17 1502 08/23/17 1213  NA 137 138  K 7.0* 6.2*  CL 103 101  CO2 23 25  GLUCOSE 84 59*  BUN 51* 35*  CREATININE 9.47* 8.37*  CALCIUM 8.6* 8.5*  PHOS 4.7* 4.8*    Liver Function Tests: Recent Labs  Lab 08/21/17 1502 08/23/17 1213  ALBUMIN 2.8* 2.8*   No results for input(s): LIPASE, AMYLASE in the last 168 hours. No results for input(s): AMMONIA in the last 168 hours.  CBC: Recent Labs  Lab 08/21/17 1502 08/23/17 1213  WBC 6.0 5.0  HGB 9.3* 9.4*  HCT 31.6* 31.0*  MCV 81.8 80.7  PLT 269 238    Cardiac Enzymes: No results for input(s): CKTOTAL, CKMB, CKMBINDEX, TROPONINI in the last 168 hours.  BNP: Invalid input(s): POCBNP  CBG: No results for input(s): GLUCAP in the last 168  hours.  Microbiology: Results for orders placed or performed during the hospital encounter of 07/12/17  Culture, blood (Routine X 2) w Reflex to ID Panel     Status: None   Collection Time: 07/12/17  4:43 PM  Result Value Ref Range Status   Specimen Description BLOOD LINE CENTRAL  Final   Special Requests   Final    BOTTLES DRAWN AEROBIC AND ANAEROBIC Blood Culture adequate volume   Culture   Final    NO GROWTH 5 DAYS Performed at [facility], [address] [place], [place] [address]    Report Status 07/17/2017 FINAL  Final    Coagulation Studies: No results for input(s): LABPROT, INR in the last 72 hours.  Urinalysis: No results for input(s): COLORURINE, LABSPEC, PHURINE, GLUCOSEU, HGBUR, BILIRUBINUR, KETONESUR, PROTEINUR, UROBILINOGEN, NITRITE, LEUKOCYTESUR in the last 72 hours.  Invalid input(s): APPERANCEUR    Imaging: No results found.   Medications:       Assessment/ Plan:  Mr. Conn Trombetta is a 57 y.o. black male withend stage renal disease on hemodialysis secondary to Alport's syndrome, ascites, hypertension, anemia of chronic kidney disease, coronary artery disease, peripheral vascular disease, hyperlipidemia, gastrointestinal AVMs, pulmonary hypertension  nooutpatientdialysis unit currently  1. Hyperkalemia Patient's last K was quite high at 6.2.  We have ordered another K today.  For now will continue  dialyzing against a 1k bath.  Continue to check K frequently.   2.  End-stage renal disease:  Currently undergoing dialysis, tolerating well.   3. Ascites Had paracentesis last week on 6/12, will continue to periodically perform paracentesis.   4.  Anemia of CKD:  hgb currnetly 9.4, continue epogen 10000 units IV with HD.   5.  Secondary hyperparathyroidism:  Phos currently 4.8, will continue to monitor.    LOS: 0 Shinita Mac 6/17/20192:18 PM

## 2017-08-28 NOTE — Progress Notes (Signed)
HD tx start    08/28/17 1348  Vital Signs  Pulse Rate 80  Pulse Rate Source Monitor  Resp 18  BP (!) 146/97  BP Location Left Arm  BP Method Automatic  Patient Position (if appropriate) Sitting  Oxygen Therapy  SpO2 100 %  O2 Device Room Air  During Hemodialysis Assessment  Blood Flow Rate (mL/min) 400 mL/min  Arterial Pressure (mmHg) -180 mmHg  Venous Pressure (mmHg) 150 mmHg  Transmembrane Pressure (mmHg) 70 mmHg  Ultrafiltration Rate (mL/min) 1290 mL/min  Dialysate Flow Rate (mL/min) 800 ml/min  Conductivity: Machine  14.1  HD Safety Checks Performed Yes  Dialysis Fluid Bolus Normal Saline  Bolus Amount (mL) 250 mL  Intra-Hemodialysis Comments Tx initiated  Hemodialysis Catheter Right Internal jugular Double-lumen  No Placement Date or Time found.   Placed prior to admission: Yes  Orientation: Right  Access Location: Internal jugular  Hemodialysis Catheter Type: Double-lumen  Blue Lumen Status Infusing  Red Lumen Status Infusing

## 2017-08-28 NOTE — ED Provider Notes (Signed)
-----------------------------------------   5:50 PM on 08/28/2017 -----------------------------------------  Patient has returned from dialysis.  Denies any complaints.  No shortness of breath, patient is asking to be discharged home.   Harvest Dark, MD 08/28/17 1750

## 2017-08-28 NOTE — ED Provider Notes (Signed)
[facility] Emergency Department Provider Note ____________________________________________   First MD Initiated Contact with Patient 08/28/17 1243     (approximate)  I have reviewed the triage vital signs and the nursing notes.   HISTORY  Chief Complaint dialysis  HPI Stanley [name] is a 57 y.o. male who is presenting to the emergency department for his routine dialysis.  He was last dialyzed this past Friday.  He has no acute complaints at this time.  Past Medical History:  Diagnosis Date  . Alcohol abuse   . CHF (congestive heart failure) ([place])   . Cirrhosis ([place])   . Coronary artery disease 2009  . Drug abuse ([place])   . End stage renal disease on dialysis [facility]) NEPHROLOGIST-   DR [facility]  IN [place]   HEMODIALYSIS --   TUES/  THURS/  SAT  . Gastrointestinal bleed 06/13/2017   From chart...hx of multiple GI bleeds  . GERD (gastroesophageal reflux disease)   . Hyperlipidemia   . Hypertension   . PAD (peripheral artery disease) ([place])   . Renal insufficiency    Per pt, 32 oz fluid restriction per day  . S/P triple vessel bypass 06/09/2016   2009ish  . Suicidal ideation    & HOMICIDAL IDEATION --  06-16-2013   ADMITTED TO BEHAVIOR HEALTH    Patient Active Problem List   Diagnosis Date Noted  . ESRD (end stage renal disease) on dialysis ([place]) 07/28/2017  . Protein-calorie malnutrition, severe 06/14/2017  . Encounter for dialysis [facility])   . Palliative care by specialist   . Goals of care, counseling/discussion   . Malnutrition of moderate degree 06/05/2017  . Secondary esophageal varices without bleeding ([place])   . Stomach irritation   . Idiopathic esophageal varices without bleeding ([place])   . Alcoholic hepatitis with ascites 05/24/2017  . ESRD (end stage renal disease) ([place]) 04/28/2017  . Uremia 03/08/2017  . ESRD on hemodialysis ([place]) 03/03/2017  . Weakness 02/28/2017  . Hypocalcemia 02/22/2017  . Shortness of breath 11/26/2016  .  COPD (chronic obstructive pulmonary disease) ([place]) 10/30/2016  . COPD exacerbation ([place]) 10/29/2016  . Anemia   . Heme positive stool   . Ulceration of intestine   . Benign neoplasm of transverse colon   . Acute gastrointestinal hemorrhage   . Esophageal candidiasis ([place])   . Angiodysplasia of intestinal tract   . Acute respiratory failure with hypoxia ([place]) 07/03/2016  . GI bleeding 06/24/2016  . Rectal bleeding 06/14/2016  . Anemia of chronic disease 06/01/2016  . MRSA carrier 06/01/2016  . Chronic renal failure 05/23/2016  . Ischemic heart disease 05/23/2016  . Angiodysplasia of small intestine   . Melena   . Small bowel bleed not requiring more than 4 units of blood in 24 hours, ICU, or surgery   . Anemia due to chronic blood loss   . Abdominal pain 05/05/2016  . Acute posthemorrhagic anemia 04/17/2016  . Gastrointestinal bleed 04/17/2016  . History of esophagogastroduodenoscopy (EGD) 04/17/2016  . Elevated troponin 04/17/2016  . Alcohol abuse 04/17/2016  . Upper GI bleed 01/19/2016  . Blood in stool   . Angiodysplasia of stomach and duodenum with hemorrhage   . Gastritis   . Reflux esophagitis   . GI bleed 05/16/2015  . Acute GI bleeding   . Symptomatic anemia 04/30/2015  . HTN (hypertension) 04/06/2015  . GERD (gastroesophageal reflux disease) 04/06/2015  . HLD (hyperlipidemia) 04/06/2015  . Dyspnea 04/06/2015  . Cirrhosis of liver with ascites ([place]) 04/06/2015  .  Ascites 04/06/2015  . GIB (gastrointestinal bleeding) 03/23/2015  . Homicidal ideation 06/19/2013  . Suicidal intent 06/19/2013  . Homicidal ideations 06/19/2013  . Hyperkalemia 06/16/2013  . Mandible fracture ([place]) 06/05/2013  . Fracture, mandible ([place]) 06/02/2013  . Coronary atherosclerosis of native coronary artery 06/02/2013  . ESRD on dialysis ([place]) 06/02/2013  . Mandible open fracture ([place]) 06/02/2013    Past Surgical History:  Procedure Laterality Date  . A/V FISTULAGRAM Right 06/06/2017    Procedure: A/V FISTULAGRAM;  Surgeon: Katha Cabal, MD;  Location: [place] CV LAB;  Service: Cardiovascular;  Laterality: Right;  . A/V SHUNT INTERVENTION N/A 06/06/2017   Procedure: A/V SHUNT INTERVENTION;  Surgeon: Katha Cabal, MD;  Location: [place] CV LAB;  Service: Cardiovascular;  Laterality: N/A;  . AGILE CAPSULE N/A 06/19/2016   Procedure: AGILE CAPSULE;  Surgeon: [name], MD;  Location: [facility] ENDOSCOPY;  Service: Endoscopy;  Laterality: N/A;  . COLONOSCOPY WITH PROPOFOL N/A 06/18/2016   Procedure: COLONOSCOPY WITH PROPOFOL;  Surgeon: [name], MD;  Location: [facility] ENDOSCOPY;  Service: Endoscopy;  Laterality: N/A;  . COLONOSCOPY WITH PROPOFOL N/A 08/12/2016   Procedure: COLONOSCOPY WITH PROPOFOL;  Surgeon: [name], MD;  Location: [facility] ENDOSCOPY;  Service: Endoscopy;  Laterality: N/A;  . COLONOSCOPY WITH PROPOFOL N/A 05/05/2017   Procedure: COLONOSCOPY WITH PROPOFOL;  Surgeon: Manya Silvas, MD;  Location: [facility] ENDOSCOPY;  Service: Endoscopy;  Laterality: N/A;  . CORONARY ANGIOPLASTY  ?   PT UNABLE TO TELL IF  BEFORE OR AFTER  CABG  . CORONARY ARTERY BYPASS GRAFT  2008  [place]   3 VESSEL  . DIALYSIS FISTULA CREATION  LAST SURGERY  APPOX  2008  . ENTEROSCOPY N/A 05/10/2016   Procedure: ENTEROSCOPY;  Surgeon: Jerene Bears, MD;  Location: [place];  Service: Gastroenterology;  Laterality: N/A;  . ENTEROSCOPY N/A 08/12/2016   Procedure: ENTEROSCOPY;  Surgeon: [name], MD;  Location: [facility] ENDOSCOPY;  Service: Endoscopy;  Laterality: N/A;  . ENTEROSCOPY Left 06/03/2017   Procedure: ENTEROSCOPY;  Surgeon: Virgel Manifold, MD;  Location: [facility] ENDOSCOPY;  Service: Endoscopy;  Laterality: Left;  Procedure date will ultimately depend on when patient is medically optimized before the procedure, pending hemodialysis and blood transfusions etc. Will place on schedule and change depending on clinical status.   . ENTEROSCOPY N/A 06/05/2017   Procedure:  ENTEROSCOPY;  Surgeon: Virgel Manifold, MD;  Location: [facility] ENDOSCOPY;  Service: Endoscopy;  Laterality: N/A;  . ENTEROSCOPY N/A 06/15/2017   Procedure: Push ENTEROSCOPY;  Surgeon: [name], MD;  Location: [facility] ENDOSCOPY;  Service: Endoscopy;  Laterality: N/A;  . ESOPHAGOGASTRODUODENOSCOPY N/A 05/07/2015   Procedure: ESOPHAGOGASTRODUODENOSCOPY (EGD);  Surgeon: Hulen Luster, MD;  Location: [facility] ENDOSCOPY;  Service: Endoscopy;  Laterality: N/A;  . ESOPHAGOGASTRODUODENOSCOPY (EGD) WITH PROPOFOL N/A 05/17/2015   Procedure: ESOPHAGOGASTRODUODENOSCOPY (EGD) WITH PROPOFOL;  Surgeon: [name], MD;  Location: [facility] ENDOSCOPY;  Service: Endoscopy;  Laterality: N/A;  . ESOPHAGOGASTRODUODENOSCOPY (EGD) WITH PROPOFOL N/A 01/20/2016   Procedure: ESOPHAGOGASTRODUODENOSCOPY (EGD) WITH PROPOFOL;  Surgeon: [name], MD;  Location: [facility] ENDOSCOPY;  Service: Endoscopy;  Laterality: N/A;  . ESOPHAGOGASTRODUODENOSCOPY (EGD) WITH PROPOFOL N/A 04/17/2016   Procedure: ESOPHAGOGASTRODUODENOSCOPY (EGD) WITH PROPOFOL;  Surgeon: Lin Landsman, MD;  Location: [facility] ENDOSCOPY;  Service: Gastroenterology;  Laterality: N/A;  . ESOPHAGOGASTRODUODENOSCOPY (EGD) WITH PROPOFOL  05/09/2016   Procedure: ESOPHAGOGASTRODUODENOSCOPY (EGD) WITH PROPOFOL;  Surgeon: Jerene Bears, MD;  Location: [place] ENDOSCOPY;  Service: Endoscopy;;  . ESOPHAGOGASTRODUODENOSCOPY (EGD) WITH PROPOFOL N/A  06/16/2016   Procedure: ESOPHAGOGASTRODUODENOSCOPY (EGD) WITH PROPOFOL;  Surgeon: [name], MD;  Location: [facility] ENDOSCOPY;  Service: Endoscopy;  Laterality: N/A;  . ESOPHAGOGASTRODUODENOSCOPY (EGD) WITH PROPOFOL N/A 05/05/2017   Procedure: ESOPHAGOGASTRODUODENOSCOPY (EGD) WITH PROPOFOL;  Surgeon: Manya Silvas, MD;  [facility] END[facility]COPY;  Service: Endoscopy;  Laterality: N/A;  . ESOPHAGOGASTRODUODENOSCOPY (EGD) WITH PROPOFOL N/A 06/15/2017   Procedure: ESOPHAGOGASTRODUODENOSCOPY (EGD) WITH PROPOFOL;  Surgeon: [name], MD;  Location: [facility] ENDOSCOPY;   Service: Endoscopy;  Laterality: N/A;  . GIVENS CAPSULE STUDY N/A 05/07/2016   Procedure: GIVENS CAPSULE STUDY;  Surgeon: Doran Stabler, MD;  Location: [place];  Service: Endoscopy;  Laterality: N/A;  . MANDIBULAR HARDWARE REMOVAL N/A 07/29/2013   Procedure: REMOVAL OF ARCH BARS;  Surgeon: Theodoro Kos, DO;  Location: [place];  Service: Plastics;  Laterality: N/A;  . ORIF MANDIBULAR FRACTURE N/A 06/05/2013   Procedure: REPAIR OF MANDIBULAR FRACTURE x 2 with maxillo-mandibular fixation ;  Surgeon: Theodoro Kos, DO;  Location: [place];  Service: Plastics;  Laterality: N/A;  . PERIPHERAL ARTERIAL STENT GRAFT Left     Prior to Admission medications   Medication Sig Start Date End Date Taking? Authorizing Provider  albuterol (PROVENTIL HFA;VENTOLIN HFA) 108 (90 Base) MCG/ACT inhaler Inhale 2 puffs into the lungs every 6 (six) hours as needed for wheezing or shortness of breath. 04/21/17  Yes Lisa Roca, MD  atorvastatin (LIPITOR) 40 MG tablet Take 1 tablet (40 mg total) by mouth daily. 11/16/16  Yes Vaughan Basta, MD  budesonide-formoterol [facility]) 160-4.5 MCG/ACT inhaler Inhale 2 puffs into the lungs daily. 11/16/16  Yes Vaughan Basta, MD  calcium acetate (PHOSLO) 667 MG capsule Take 2,001 mg by mouth 3 (three) times daily.   Yes [provider]  ferrous sulfate 325 (65 FE) MG tablet Take 1 tablet by mouth 3 (three) times daily. 01/21/17  Yes [provider]  furosemide (LASIX) 80 MG tablet Take 1 tablet (80 mg total) daily by mouth. 01/25/17  Yes Demetrios Loll, MD  gabapentin (NEURONTIN) 300 MG capsule Take 1 capsule by mouth daily as directed 08/16/16  Yes [provider]  ipratropium-albuterol (DUONEB) 0.5-2.5 (3) MG/3ML SOLN Take 3 mLs by nebulization every 6 (six) hours as needed. 11/28/16  Yes Wieting, Richard, MD  labetalol (NORMODYNE) 100 MG tablet Take 100 mg by mouth daily. 03/24/17  Yes [provider]  Multiple  Vitamins-Minerals-FA (DIALYVITE SUPREME D) 3 MG TABS Take 1 tablet (3 mg total) by mouth daily. 11/28/16  Yes Wieting, Richard, MD  nitroGLYCERIN (NITROSTAT) 0.4 MG SL tablet Place 1 tablet (0.4 mg total) under the tongue every 5 (five) minutes as needed. 04/12/16  Yes Wende Bushy, MD  pantoprazole (PROTONIX) 40 MG tablet Take 1 tablet (40 mg total) by mouth daily. 11/28/16  Yes Loletha Grayer, MD  Spacer/Aero Chamber Mouthpiece MISC 1 Units by Does not apply route every 4 (four) hours as needed (wheezing). 02/19/17  Yes Darel Hong, MD  tiotropium (SPIRIVA HANDIHALER) 18 MCG inhalation capsule Place 1 capsule (18 mcg total) into inhaler and inhale daily. 11/16/16 11/16/17 Yes Vaughan Basta, MD  alum & mag hydroxide-simeth (MAALOX/MYLANTA) 200-200-20 MG/5ML suspension Take 15 mLs every 4 (four) hours as needed by mouth for indigestion or heartburn. Patient not taking: Reported on 04/19/2017 01/25/17   Demetrios Loll, MD  doxycycline (VIBRA-TABS) 100 MG tablet Take 1 tablet (100 mg total) by mouth 2 (two) times daily. Patient not taking: Reported on 08/28/2017 07/10/17   Harvest Dark, MD  Allergies Patient has no known allergies.  Family History  Problem Relation Age of Onset  . Colon cancer Mother   . Cancer Father   . Cancer Sister   . Kidney disease Brother     Social History Social History   Tobacco Use  . Smoking status: Current Every Day Smoker    Packs/day: 0.15    Years: 40.00    Pack years: 6.00    Types: Cigarettes  . Smokeless tobacco: Never Used  Substance Use Topics  . Alcohol use: No    Comment: pt reports quitting after learning about cirrhosis  . Drug use: No    Frequency: 7.0 times per week    Types: Marijuana, Cocaine    Review of Systems  Constitutional: No fever/chills Eyes: No visual changes. ENT: No sore throat. Cardiovascular: Denies chest pain. Respiratory: Denies shortness of breath. Gastrointestinal: No abdominal pain.  No nausea, no  vomiting.  No diarrhea.  No constipation. Genitourinary: Negative for dysuria. Musculoskeletal: Negative for back pain. Skin: Negative for rash. Neurological: Negative for headaches, focal weakness or numbness.   ____________________________________________   PHYSICAL EXAM:  VITAL SIGNS: ED Triage Vitals  Enc Vitals Group     BP 08/28/17 1218 129/86     Pulse Rate 08/28/17 1218 80     Resp 08/28/17 1218 18     Temp 08/28/17 1218 98 F (36.7 C)     Temp Source 08/28/17 1218 Oral     SpO2 08/28/17 1218 100 %     Weight 08/28/17 1219 155 lb (70.3 kg)     Height 08/28/17 1219 6\' 3"  (1.905 m)     Head Circumference --      Peak Flow --      Pain Score 08/28/17 1219 0     Pain Loc --      Pain Edu? --      Excl. in [place]? --     Constitutional: Alert and oriented. Well appearing and in no acute distress. Eyes: Conjunctivae are normal.  Head: Atraumatic. Nose: No congestion/rhinnorhea. Mouth/Throat: Mucous membranes are moist.  Neck: No stridor.   Cardiovascular: Normal rate, regular rhythm. Grossly normal heart sounds.  Right-sided chest permacath in place and the dressings are CDI. Respiratory: Normal respiratory effort.  No retractions. Lungs CTAB. Gastrointestinal: Soft and nontender. No distention.  Musculoskeletal: No lower extremity tenderness nor edema.  No joint effusions. Neurologic:  Normal speech and language. No gross focal neurologic deficits are appreciated. Skin:  Skin is warm, dry and intact. No rash noted. Psychiatric: Mood and affect are normal. Speech and behavior are normal.  ____________________________________________   LABS (all labs ordered are listed, but only abnormal results are displayed)  Labs Reviewed  RENAL FUNCTION PANEL  CBC   ____________________________________________  EKG   ____________________________________________  RADIOLOGY   ____________________________________________   PROCEDURES  Procedure(s) performed:    Procedures  Critical Care performed:   ____________________________________________   INITIAL IMPRESSION / ASSESSMENT AND PLAN / ED COURSE  Pertinent labs & imaging results that were available during my care of the patient were reviewed by me and considered in my medical decision making (see chart for details).  DDX: Chronic kidney disease, appointment for in center dialysis, hyperkalemia, lateral abnormality As part of my medical decision making, I reviewed the following data within the [place] Notes from prior ED visits  ----------------------------------------- 2:00 PM on 08/28/2017 -----------------------------------------  Discussed the case with Dr. Zollie Scale.  Patient to be dialyzed. ____________________________________________  FINAL CLINICAL IMPRESSION(S) / ED DIAGNOSES  Final diagnoses:  Visit for chronic in-center hemodialysis ([place])      NEW MEDICATIONS STARTED DURING THIS VISIT:  Current Discharge Medication List       Note:  This document was prepared using Dragon voice recognition software and may include unintentional dictation errors.     Orbie Pyo, MD 08/28/17 1400

## 2017-08-28 NOTE — ED Triage Notes (Signed)
Here for scheduled dialysis. States feels well.

## 2017-08-28 NOTE — ED Provider Notes (Signed)
ED ECG REPORT I, Doran Stabler, the attending physician, personally viewed and interpreted this ECG.   Date: 08/28/2017  EKG Time: 1157  Rate: 69  Rhythm: normal sinus rhythm  Axis: Normal  Intervals:first-degree A-V block   ST&T Change: No ST segment elevation or depression.  T wave inversions in 2, 3 and aVF as well as V2 with biphasic T waves in V3.  No T wave peaking. No significant change from previous.   Orbie Pyo, MD 08/28/17 [phone]

## 2017-08-28 NOTE — Progress Notes (Signed)
HD tx end    08/28/17 1726  Vital Signs  Pulse Rate 80  Pulse Rate Source Monitor  Resp 18  BP 117/69  BP Location Left Arm  BP Method Automatic  Patient Position (if appropriate) Sitting  Oxygen Therapy  SpO2 100 %  O2 Device Room Air  During Hemodialysis Assessment  Dialysis Fluid Bolus Normal Saline  Bolus Amount (mL) 250 mL  Intra-Hemodialysis Comments Tx completed

## 2017-08-29 ENCOUNTER — Other Ambulatory Visit (INDEPENDENT_AMBULATORY_CARE_PROVIDER_SITE_OTHER): Payer: Self-pay

## 2017-08-29 DIAGNOSIS — N186 End stage renal disease: Secondary | ICD-10-CM

## 2017-08-29 DIAGNOSIS — Z992 Dependence on renal dialysis: Principal | ICD-10-CM

## 2017-08-29 NOTE — Care Management (Addendum)
Vascular access would be beneficial as his right arm fistula is clotted and I understand that vascular cannot see him until November for vascular mapping. He is currently using a intrajugular access for hemodialysis per liasion.  RNCM attempted to reach out [facility] but they were at lunch. He uses public transportation and this will impact my ability to reach to another vascular team- message left on nurses line to call this RNCM back. RNCM called back to [facility]. They are not able to schedule and said that either they can arrange it or RNCM can arrange it with [facility] ultrasound. [facility] ultrasound will need an order in Epic.   [facility] can see him for vascular mapping on Monday 09/04/17 at 0800AM. I have reached back to Mickel Baas with [facility] to obtain order for vascular mapping at [facility] ultrasound department. Estill Bamberg [facility] liaison will notify patient of this plan tomorrow during routine visit at [facility] for outpatient hemodialysis.

## 2017-08-30 ENCOUNTER — Other Ambulatory Visit: Payer: Self-pay

## 2017-08-30 ENCOUNTER — Emergency Department
Admission: EM | Admit: 2017-08-30 | Discharge: 2017-08-30 | Discharge status: Against Medical Advice | Payer: Medicare Other | Attending: Emergency Medicine | Admitting: Emergency Medicine

## 2017-08-30 DIAGNOSIS — I509 Heart failure, unspecified: Secondary | ICD-10-CM | POA: Insufficient documentation

## 2017-08-30 DIAGNOSIS — I120 Hypertensive chronic kidney disease with stage 5 chronic kidney disease or end stage renal disease: Secondary | ICD-10-CM | POA: Diagnosis not present

## 2017-08-30 DIAGNOSIS — N186 End stage renal disease: Secondary | ICD-10-CM | POA: Diagnosis not present

## 2017-08-30 DIAGNOSIS — F17210 Nicotine dependence, cigarettes, uncomplicated: Secondary | ICD-10-CM | POA: Insufficient documentation

## 2017-08-30 DIAGNOSIS — I2510 Atherosclerotic heart disease of native coronary artery without angina pectoris: Secondary | ICD-10-CM | POA: Diagnosis not present

## 2017-08-30 DIAGNOSIS — Z992 Dependence on renal dialysis: Secondary | ICD-10-CM | POA: Diagnosis not present

## 2017-08-30 DIAGNOSIS — I132 Hypertensive heart and chronic kidney disease with heart failure and with stage 5 chronic kidney disease, or end stage renal disease: Secondary | ICD-10-CM | POA: Insufficient documentation

## 2017-08-30 NOTE — ED Triage Notes (Signed)
Pt here for dialysis. Voices no complaints. Pt alert and oriented X4, active, cooperative, pt in NAD. RR even and unlabored, color WNL.

## 2017-08-30 NOTE — ED Notes (Addendum)
Dialysis called and relayed they would not be able to perform dialysis on Stanley [name] until about 6pm. [name] made aware and decided to walk out/leave. He stated "I am not waiting and will be back Friday" MD made aware

## 2017-08-30 NOTE — ED Notes (Signed)
Patient ambulated to stretcher no problems. No respiratory distress noted. A&O x4

## 2017-08-30 NOTE — Care Management (Addendum)
No hemodialysis chair this morning; PM available and patient relies on [place] for transportation. RNCM spoke with Estill Bamberg [facility] liaison and patient will return tomorrow for hemodialysis- Estill Bamberg will meet with him tomorrow about vascular mapping Monday 8am. Update at 1513: RNCM notified by Estill Bamberg with [facility] that patient was notified that mapping for for 09/09/17 so I went directly to [name] to confirm. Due to transportation issues as stated above appointment was changed back to Monday at [place] (instead of 8AM). Patient has transportation arranged per Estill Bamberg for [place] (as originally planned).

## 2017-08-30 NOTE — ED Provider Notes (Signed)
[facility] Emergency Department Provider Note       Time seen: ----------------------------------------- 11:57 AM on 08/30/2017 -----------------------------------------   I have reviewed the triage vital signs and the nursing notes.  HISTORY   Chief Complaint Dialysis    HPI Stanley [name] is a 57 y.o. male with a history of alcohol abuse, CHF, cirrhosis, coronary artery disease, drug abuse, end-stage renal disease on dialysis who presents to the ED for his regular dialysis.  He has no complaints, no shortness of breath, fever or other complaints.  Past Medical History:  Diagnosis Date  . Alcohol abuse   . CHF (congestive heart failure) ([place])   . Cirrhosis ([place])   . Coronary artery disease 2009  . Drug abuse ([place])   . End stage renal disease on dialysis [facility]) NEPHROLOGIST-   DR [facility]  IN [place]   HEMODIALYSIS --   TUES/  THURS/  SAT  . Gastrointestinal bleed 06/13/2017   From chart...hx of multiple GI bleeds  . GERD (gastroesophageal reflux disease)   . Hyperlipidemia   . Hypertension   . PAD (peripheral artery disease) ([place])   . Renal insufficiency    Per pt, 32 oz fluid restriction per day  . S/P triple vessel bypass 06/09/2016   2009ish  . Suicidal ideation    & HOMICIDAL IDEATION --  06-16-2013   ADMITTED TO BEHAVIOR HEALTH    Patient Active Problem List   Diagnosis Date Noted  . ESRD (end stage renal disease) on dialysis ([place]) 07/28/2017  . Protein-calorie malnutrition, severe 06/14/2017  . Encounter for dialysis [facility])   . Palliative care by specialist   . Goals of care, counseling/discussion   . Malnutrition of moderate degree 06/05/2017  . Secondary esophageal varices without bleeding ([place])   . Stomach irritation   . Idiopathic esophageal varices without bleeding ([place])   . Alcoholic hepatitis with ascites 05/24/2017  . ESRD (end stage renal disease) ([place]) 04/28/2017  . Uremia 03/08/2017  . ESRD on hemodialysis ([place])  03/03/2017  . Weakness 02/28/2017  . Hypocalcemia 02/22/2017  . Shortness of breath 11/26/2016  . COPD (chronic obstructive pulmonary disease) ([place]) 10/30/2016  . COPD exacerbation ([place]) 10/29/2016  . Anemia   . Heme positive stool   . Ulceration of intestine   . Benign neoplasm of transverse colon   . Acute gastrointestinal hemorrhage   . Esophageal candidiasis ([place])   . Angiodysplasia of intestinal tract   . Acute respiratory failure with hypoxia ([place]) 07/03/2016  . GI bleeding 06/24/2016  . Rectal bleeding 06/14/2016  . Anemia of chronic disease 06/01/2016  . MRSA carrier 06/01/2016  . Chronic renal failure 05/23/2016  . Ischemic heart disease 05/23/2016  . Angiodysplasia of small intestine   . Melena   . Small bowel bleed not requiring more than 4 units of blood in 24 hours, ICU, or surgery   . Anemia due to chronic blood loss   . Abdominal pain 05/05/2016  . Acute posthemorrhagic anemia 04/17/2016  . Gastrointestinal bleed 04/17/2016  . History of esophagogastroduodenoscopy (EGD) 04/17/2016  . Elevated troponin 04/17/2016  . Alcohol abuse 04/17/2016  . Upper GI bleed 01/19/2016  . Blood in stool   . Angiodysplasia of stomach and duodenum with hemorrhage   . Gastritis   . Reflux esophagitis   . GI bleed 05/16/2015  . Acute GI bleeding   . Symptomatic anemia 04/30/2015  . HTN (hypertension) 04/06/2015  . GERD (gastroesophageal reflux disease) 04/06/2015  . HLD (hyperlipidemia) 04/06/2015  .  Dyspnea 04/06/2015  . Cirrhosis of liver with ascites ([place]) 04/06/2015  . Ascites 04/06/2015  . GIB (gastrointestinal bleeding) 03/23/2015  . Homicidal ideation 06/19/2013  . Suicidal intent 06/19/2013  . Homicidal ideations 06/19/2013  . Hyperkalemia 06/16/2013  . Mandible fracture ([place]) 06/05/2013  . Fracture, mandible ([place]) 06/02/2013  . Coronary atherosclerosis of native coronary artery 06/02/2013  . ESRD on dialysis ([place]) 06/02/2013  . Mandible open fracture ([place])  06/02/2013    Past Surgical History:  Procedure Laterality Date  . A/V FISTULAGRAM Right 06/06/2017   Procedure: A/V FISTULAGRAM;  Surgeon: Katha Cabal, MD;  Location: [place] CV LAB;  Service: Cardiovascular;  Laterality: Right;  . A/V SHUNT INTERVENTION N/A 06/06/2017   Procedure: A/V SHUNT INTERVENTION;  Surgeon: Katha Cabal, MD;  Location: [place] [facility];  Service: Cardiovascular;  Laterality: N/A;  . AGILE CAPSULE N/A 06/19/2016   Procedure: AGILE CAPSULE;  Surgeon: [name], MD;  Location: [facility] ENDOSCOPY;  Service: Endoscopy;  Laterality: N/A;  . COLONOSCOPY WITH PROPOFOL N/A 06/18/2016   Procedure: COLONOSCOPY WITH PROPOFOL;  Surgeon: [name], MD;  Location: [facility] ENDOSCOPY;  Service: Endoscopy;  Laterality: N/A;  . COLONOSCOPY WITH PROPOFOL N/A 08/12/2016   Procedure: COLONOSCOPY WITH PROPOFOL;  Surgeon: [name], MD;  Location: [facility] ENDOSCOPY;  Service: Endoscopy;  Laterality: N/A;  . COLONOSCOPY WITH PROPOFOL N/A 05/05/2017   Procedure: COLONOSCOPY WITH PROPOFOL;  Surgeon: Manya Silvas, MD;  Location: [facility] ENDOSCOPY;  Service: Endoscopy;  Laterality: N/A;  . CORONARY ANGIOPLASTY  ?   PT UNABLE TO TELL IF  BEFORE OR AFTER  CABG  . CORONARY ARTERY BYPASS GRAFT  2008  ([place] , [place])   3 VESSEL  . DIALYSIS FISTULA CREATION  LAST SURGERY  APPOX  2008  . ENTEROSCOPY N/A 05/10/2016   Procedure: ENTEROSCOPY;  Surgeon: Jerene Bears, MD;  Location: [place];  Service: Gastroenterology;  Laterality: N/A;  . ENTEROSCOPY N/A 08/12/2016   Procedure: ENTEROSCOPY;  Surgeon: [name], MD;  Location: [facility] ENDOSCOPY;  Service: Endoscopy;  Laterality: N/A;  . ENTEROSCOPY Left 06/03/2017   Procedure: ENTEROSCOPY;  Surgeon: Virgel Manifold, MD;  Location: [facility] ENDOSCOPY;  Service: Endoscopy;  Laterality: Left;  Procedure date will ultimately depend on when patient is medically optimized before the procedure, pending hemodialysis and blood transfusions etc. Will  place on schedule and change depending on clinical status.   . ENTEROSCOPY N/A 06/05/2017   Procedure: ENTEROSCOPY;  Surgeon: Virgel Manifold, MD;  Location: [facility] ENDOSCOPY;  Service: Endoscopy;  Laterality: N/A;  . ENTEROSCOPY N/A 06/15/2017   Procedure: Push ENTEROSCOPY;  Surgeon: [name], MD;  Location: [facility] ENDOSCOPY;  Service: Endoscopy;  Laterality: N/A;  . ESOPHAGOGASTRODUODENOSCOPY N/A 05/07/2015   Procedure: ESOPHAGOGASTRODUODENOSCOPY (EGD);  Surgeon: Hulen Luster, MD;  Location: [facility] ENDOSCOPY;  Service: Endoscopy;  Laterality: N/A;  . ESOPHAGOGASTRODUODENOSCOPY (EGD) WITH PROPOFOL N/A 05/17/2015   Procedure: ESOPHAGOGASTRODUODENOSCOPY (EGD) WITH PROPOFOL;  Surgeon: [name], MD;  Location: [facility] ENDOSCOPY;  Service: Endoscopy;  Laterality: N/A;  . ESOPHAGOGASTRODUODENOSCOPY (EGD) WITH PROPOFOL N/A 01/20/2016   Procedure: ESOPHAGOGASTRODUODENOSCOPY (EGD) WITH PROPOFOL;  Surgeon: [name], MD;  Location: [facility] ENDOSCOPY;  Service: Endoscopy;  Laterality: N/A;  . ESOPHAGOGASTRODUODENOSCOPY (EGD) WITH PROPOFOL N/A 04/17/2016   Procedure: ESOPHAGOGASTRODUODENOSCOPY (EGD) WITH PROPOFOL;  Surgeon: Lin Landsman, MD;  Location: [facility] ENDOSCOPY;  Service: Gastroenterology;  Laterality: N/A;  . ESOPHAGOGASTRODUODENOSCOPY (EGD) WITH PROPOFOL  05/09/2016   Procedure: ESOPHAGOGASTRODUODENOSCOPY (EGD) WITH PROPOFOL;  Surgeon: Jerene Bears, MD;  Location: [facility] ENDOSCOPY;  Service: Endoscopy;;  . ESOPHAGOGASTRODUODENOSCOPY (EGD) WITH PROPOFOL N/A 06/16/2016   Procedure: ESOPHAGOGASTRODUODENOSCOPY (EGD) WITH PROPOFOL;  Surgeon: [name], MD;  Location: [facility] ENDOSCOPY;  Service: Endoscopy;  Laterality: N/A;  . ESOPHAGOGASTRODUODENOSCOPY (EGD) WITH PROPOFOL N/A 05/05/2017   Procedure: ESOPHAGOGASTRODUODENOSCOPY (EGD) WITH PROPOFOL;  Surgeon: Manya Silvas, MD;  Location: [facility] ENDOSCOPY;  Service: Endoscopy;  Laterality: N/A;  . ESOPHAGOGASTRODUODENOSCOPY (EGD) WITH PROPOFOL N/A 06/15/2017    Procedure: ESOPHAGOGASTRODUODENOSCOPY (EGD) WITH PROPOFOL;  Surgeon: [name], MD;  Location: [facility] ENDOSCOPY;  Service: Endoscopy;  Laterality: N/A;  . GIVENS CAPSULE STUDY N/A 05/07/2016   Procedure: GIVENS CAPSULE STUDY;  Surgeon: Doran Stabler, MD;  Location: [place];  Service: Endoscopy;  Laterality: N/A;  . MANDIBULAR HARDWARE REMOVAL N/A 07/29/2013   Procedure: REMOVAL OF ARCH BARS;  Surgeon: Theodoro Kos, DO;  Location: [place];  Service: Plastics;  Laterality: N/A;  . ORIF MANDIBULAR FRACTURE N/A 06/05/2013   Procedure: REPAIR OF MANDIBULAR FRACTURE x 2 with maxillo-mandibular fixation ;  Surgeon: Theodoro Kos, DO;  Location: [place];  Service: Plastics;  Laterality: N/A;  . PERIPHERAL ARTERIAL STENT GRAFT Left     Allergies Patient has no known allergies.  Social History Social History   Tobacco Use  . Smoking status: Current Every Day Smoker    Packs/day: 0.15    Years: 40.00    Pack years: 6.00    Types: Cigarettes  . Smokeless tobacco: Never Used  Substance Use Topics  . Alcohol use: No    Comment: pt reports quitting after learning about cirrhosis  . Drug use: No    Frequency: 7.0 times per week    Types: Marijuana, Cocaine   Review of Systems Constitutional: Negative for fever. Cardiovascular: Negative for chest pain. Respiratory: Negative for shortness of breath. Gastrointestinal: Negative for abdominal pain, vomiting and diarrhea. Musculoskeletal: Negative for back pain. Skin: Negative for rash. Neurological: Negative for headaches, focal weakness or numbness.  All systems negative/normal/unremarkable except as stated in the HPI  ____________________________________________   PHYSICAL EXAM:  VITAL SIGNS: ED Triage Vitals [08/30/17 1135]  Enc Vitals Group     BP 118/79     Pulse Rate 75     Resp 16     Temp 98.1 F (36.7 C)     Temp Source Oral     SpO2 100 %     Weight 150 lb (68 kg)     Height 6\' 3"  (1.905 m)      Head Circumference      Peak Flow      Pain Score 0     Pain Loc      Pain Edu?      Excl. in [place]?    Constitutional: Alert and oriented. Well appearing and in no distress. Eyes: Conjunctivae are normal. Normal extraocular movements. Cardiovascular: Normal rate, regular rhythm.  Respiratory: Normal respiratory effort without tachypnea nor retractions. Breath sounds are clear and equal bilaterally. No wheezes/rales/rhonchi. Gastrointestinal: Soft and nontender. Normal bowel sounds Musculoskeletal: Nontender with normal range of motion in extremities. No lower extremity tenderness nor edema. Neurologic:  Normal speech and language. No gross focal neurologic deficits are appreciated.  Skin:  Skin is warm, dry and intact. No rash noted. Psychiatric: Mood and affect are normal. Speech and behavior are normal.  ____________________________________________  ED COURSE:  As part of my medical decision making, I reviewed the following data within the [place] History obtained from family if  available, nursing notes, old chart and ekg, as well as notes from prior ED visits. Patient presented for dialysis, he appears medically cleared for same.   Procedures ____________________________________________  DIFFERENTIAL DIAGNOSIS   End-stage renal disease on dialysis  FINAL ASSESSMENT AND PLAN  End-stage renal disease on dialysis   Plan: The patient had presented for dialysis and is medically cleared for same.   Laurence Aly, MD   Note: This note was generated in part or whole with voice recognition software. Voice recognition is usually quite accurate but there are transcription errors that can and very often do occur. I apologize for any typographical errors that were not detected and corrected.     Earleen Newport, MD 08/30/17 1158

## 2017-08-30 NOTE — ED Provider Notes (Signed)
Patient was informed that he cannot be dialyzed for another 5 hours at which point he abruptly left the ER.  Earleen Newport, MD 08/30/17 [phone]

## 2017-08-31 IMAGING — US US PARACENTESIS
1 series · 3 of 3 positions shown · non-contrast
Comparison: Ultrasound-guided paracentesis - 12/26/2014; 12/04/2014

MEDICATIONS:
None.

COMPLICATIONS:
None immediate

INDICATION: Recurrent symptomatic ascites

EXAM:
ULTRASOUND-GUIDED PARACENTESIS
TECHNIQUE: Informed written consent was obtained from the patient after a
discussion of the risks, benefits and alternatives to treatment. A
timeout was performed prior to the initiation of the procedure.

[Series 1: us paracentesis · 0.27mm/px · 3 of 3 slices shown]
[im 1/3]
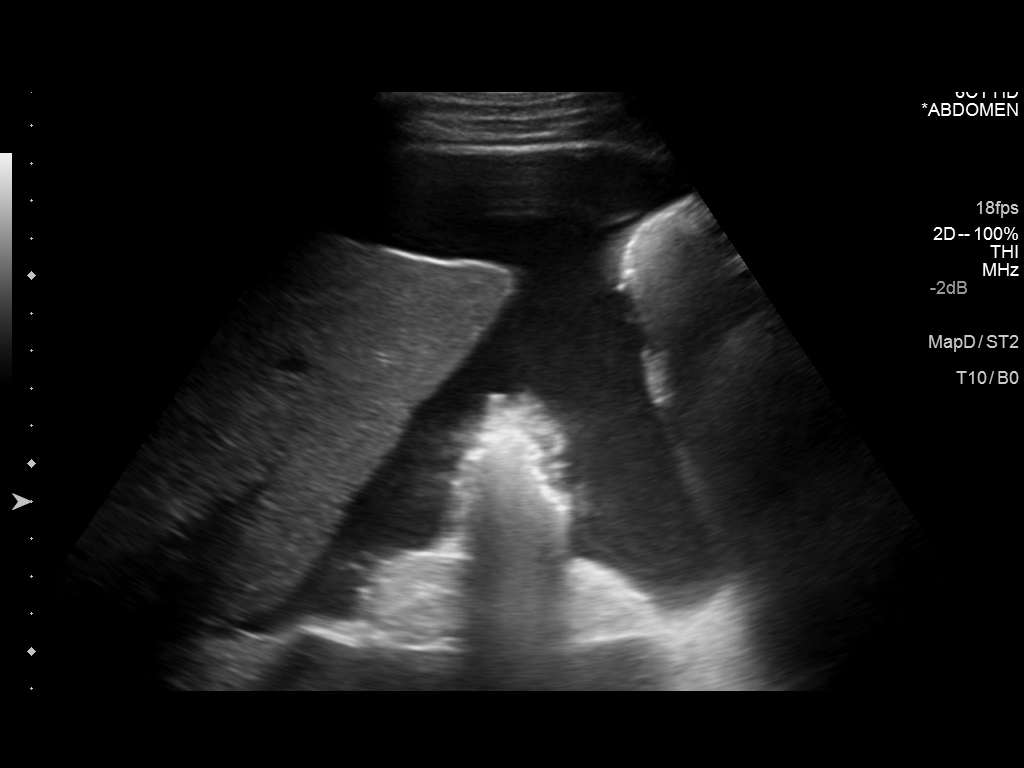
[im 2/3]
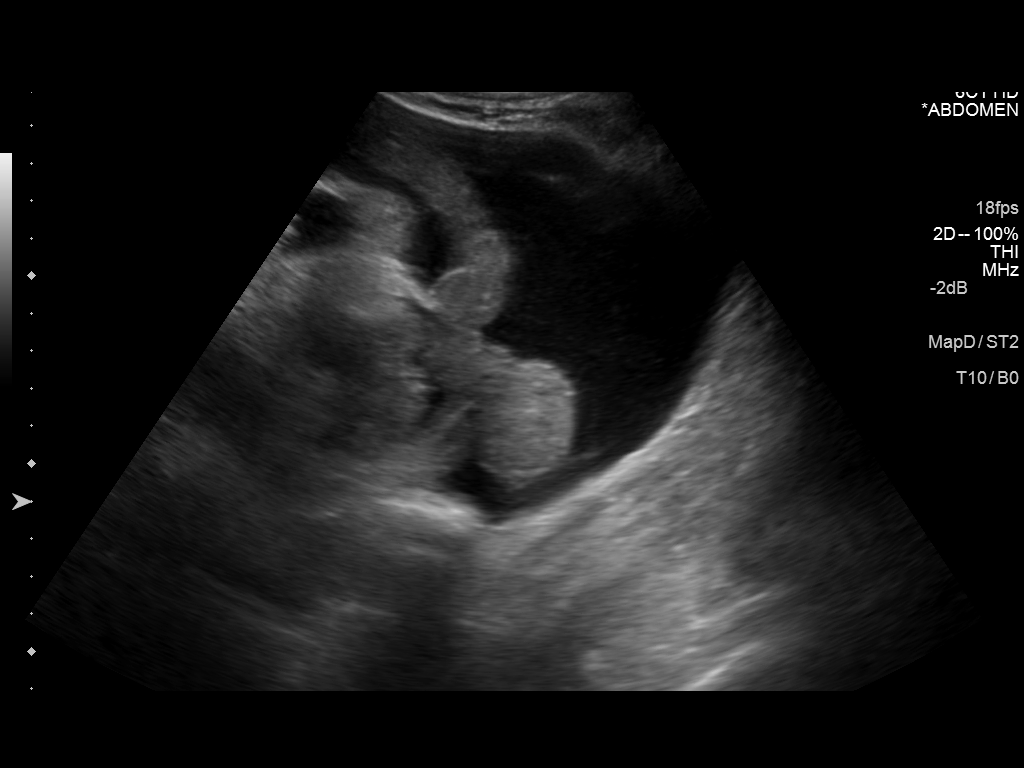
[im 3/3]
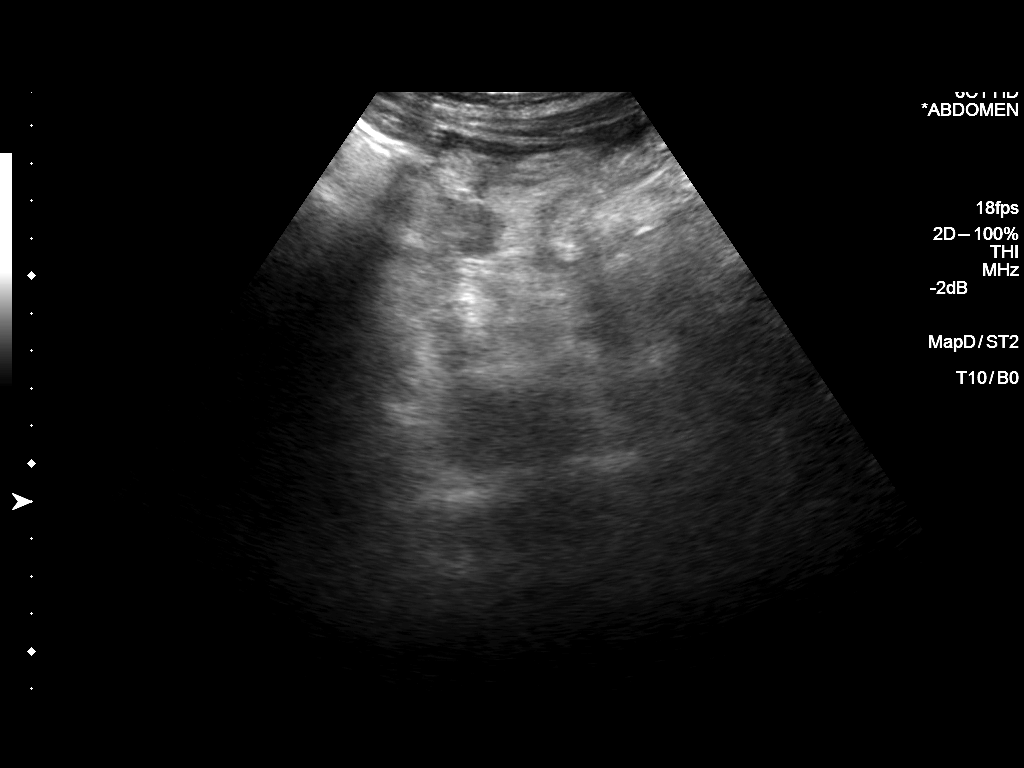

[3 of 3 positions shown; findings below may reference images not displayed]

Initial ultrasound scanning demonstrates a moderate to large amount
of ascites within the right lower abdominal quadrant. The right
lower abdomen was prepped and draped in the usual sterile fashion.
1% lidocaine with epinephrine was used for local anesthesia. An
ultrasound image was saved for documentation purposed. An 8 Fr
Safe-T-Centesis catheter was introduced. The paracentesis was
performed. The catheter was removed and a dressing was applied. The
patient tolerated the procedure well without immediate post
procedural complication.
FINDINGS: A total of approximately 4.7 liters of serous fluid was removed.
IMPRESSION: Successful ultrasound-guided paracentesis yielding 4.7 liters of
peritoneal fluid.

## 2017-09-01 ENCOUNTER — Encounter: Payer: Self-pay | Admitting: Emergency Medicine

## 2017-09-01 ENCOUNTER — Non-Acute Institutional Stay
Admission: EM | Admit: 2017-09-01 | Discharge: 2017-09-01 | Disposition: A | Discharge status: Home / Self Care | Payer: Medicare Other | Attending: Emergency Medicine | Admitting: Emergency Medicine

## 2017-09-01 DIAGNOSIS — Z800 Family history of malignant neoplasm of digestive organs: Secondary | ICD-10-CM | POA: Insufficient documentation

## 2017-09-01 DIAGNOSIS — K7460 Unspecified cirrhosis of liver: Secondary | ICD-10-CM | POA: Insufficient documentation

## 2017-09-01 DIAGNOSIS — E875 Hyperkalemia: Secondary | ICD-10-CM | POA: Diagnosis not present

## 2017-09-01 DIAGNOSIS — I739 Peripheral vascular disease, unspecified: Secondary | ICD-10-CM | POA: Diagnosis not present

## 2017-09-01 DIAGNOSIS — L299 Pruritus, unspecified: Secondary | ICD-10-CM | POA: Insufficient documentation

## 2017-09-01 DIAGNOSIS — N186 End stage renal disease: Secondary | ICD-10-CM | POA: Diagnosis present

## 2017-09-01 DIAGNOSIS — D631 Anemia in chronic kidney disease: Secondary | ICD-10-CM | POA: Diagnosis not present

## 2017-09-01 DIAGNOSIS — I132 Hypertensive heart and chronic kidney disease with heart failure and with stage 5 chronic kidney disease, or end stage renal disease: Secondary | ICD-10-CM | POA: Diagnosis not present

## 2017-09-01 DIAGNOSIS — Z79899 Other long term (current) drug therapy: Secondary | ICD-10-CM | POA: Diagnosis not present

## 2017-09-01 DIAGNOSIS — J449 Chronic obstructive pulmonary disease, unspecified: Secondary | ICD-10-CM | POA: Diagnosis not present

## 2017-09-01 DIAGNOSIS — I272 Pulmonary hypertension, unspecified: Secondary | ICD-10-CM | POA: Insufficient documentation

## 2017-09-01 DIAGNOSIS — R188 Other ascites: Secondary | ICD-10-CM | POA: Diagnosis not present

## 2017-09-01 DIAGNOSIS — Z992 Dependence on renal dialysis: Secondary | ICD-10-CM | POA: Diagnosis not present

## 2017-09-01 DIAGNOSIS — N2581 Secondary hyperparathyroidism of renal origin: Secondary | ICD-10-CM | POA: Insufficient documentation

## 2017-09-01 DIAGNOSIS — I509 Heart failure, unspecified: Secondary | ICD-10-CM | POA: Diagnosis not present

## 2017-09-01 DIAGNOSIS — Z951 Presence of aortocoronary bypass graft: Secondary | ICD-10-CM | POA: Diagnosis not present

## 2017-09-01 DIAGNOSIS — K210 Gastro-esophageal reflux disease with esophagitis: Secondary | ICD-10-CM | POA: Diagnosis not present

## 2017-09-01 DIAGNOSIS — F17210 Nicotine dependence, cigarettes, uncomplicated: Secondary | ICD-10-CM | POA: Diagnosis not present

## 2017-09-01 DIAGNOSIS — I2510 Atherosclerotic heart disease of native coronary artery without angina pectoris: Secondary | ICD-10-CM | POA: Insufficient documentation

## 2017-09-01 DIAGNOSIS — E785 Hyperlipidemia, unspecified: Secondary | ICD-10-CM | POA: Insufficient documentation

## 2017-09-01 DIAGNOSIS — I120 Hypertensive chronic kidney disease with stage 5 chronic kidney disease or end stage renal disease: Secondary | ICD-10-CM | POA: Diagnosis not present

## 2017-09-01 LAB — RENAL FUNCTION PANEL
ANION GAP: 9 (ref 5–15)
Albumin: 3.3 g/dL — ABNORMAL LOW (ref 3.5–5.0)
BUN: 38 mg/dL — ABNORMAL HIGH (ref 6–20)
CHLORIDE: 97 mmol/L — AB (ref 101–111)
CO2: 30 mmol/L (ref 22–32)
Calcium: 8.6 mg/dL — ABNORMAL LOW (ref 8.9–10.3)
Creatinine, Ser: 6.29 mg/dL — ABNORMAL HIGH (ref 0.61–1.24)
GFR calc non Af Amer: 9 mL/min — ABNORMAL LOW (ref >60)
GFR, EST AFRICAN AMERICAN: 10 mL/min — AB (ref >60)
Glucose, Bld: 132 mg/dL — ABNORMAL HIGH (ref 65–99)
Phosphorus: 1.4 mg/dL — ABNORMAL LOW (ref 2.5–4.6)
Potassium: 4.4 mmol/L (ref 3.5–5.1)
Sodium: 136 mmol/L (ref 135–145)

## 2017-09-01 LAB — CBC
HCT: 33.6 % — ABNORMAL LOW (ref 40.0–52.0)
HEMOGLOBIN: 10.3 g/dL — AB (ref 13.0–18.0)
MCH: 24.6 pg — ABNORMAL LOW (ref 26.0–34.0)
MCHC: 30.7 g/dL — ABNORMAL LOW (ref 32.0–36.0)
MCV: 79.9 fL — AB (ref 80.0–100.0)
Platelets: 274 10*3/uL (ref 150–440)
RBC: 4.2 MIL/uL — AB (ref 4.40–5.90)
RDW: 22.2 % — ABNORMAL HIGH (ref 11.5–14.5)
WBC: 7.7 10*3/uL (ref 3.8–10.6)

## 2017-09-01 LAB — PHOSPHORUS: PHOSPHORUS: 2.3 mg/dL — AB (ref 2.5–4.6)

## 2017-09-01 MED ORDER — HEPARIN SODIUM (PORCINE) 1000 UNIT/ML DIALYSIS: 1000.0000 [IU] | INTRAMUSCULAR | Status: DC | PRN

## 2017-09-01 MED ORDER — HYDROXYZINE HCL 10 MG/5ML PO SYRP
10.0000 mg | ORAL_SOLUTION | Freq: Once | ORAL | Status: AC
  Administered 2017-09-01: 10 mg via ORAL
  Filled 2017-09-01: qty 5

## 2017-09-01 MED ORDER — SODIUM CHLORIDE 0.9 % IV SOLN: 100.0000 mL | INTRAVENOUS | Status: DC | PRN

## 2017-09-01 MED ORDER — ALTEPLASE 2 MG IJ SOLR: 2.0000 mg | Freq: Once | INTRAMUSCULAR | Status: DC | PRN

## 2017-09-01 MED ORDER — CHLORHEXIDINE GLUCONATE CLOTH 2 % EX PADS
6.0000 | MEDICATED_PAD | Freq: Every day | CUTANEOUS | Status: DC
  Administered 2017-09-01: 6 via TOPICAL

## 2017-09-01 NOTE — Progress Notes (Signed)
[facility]  ROUNDING NOTE   Subjective:  Patient continues to have intermittent hyperkalemia. Most recent serum potassium was down to 4.9. He missed dialysis on Wednesday however. Most recent hemoglobin was 10. Complains of pruritus.   Objective:  Vital signs in last 24 hours:  Temp:  [98.4 F (36.9 C)] 98.4 F (36.9 C) (06/21 1136) Pulse Rate:  [82] 82 (06/21 1136) Resp:  [20] 20 (06/21 1136) BP: (114)/(73) 114/73 (06/21 1136) SpO2:  [99 %] 99 % (06/21 1136) Weight:  [68 kg (150 lb)] 68 kg (150 lb) (06/21 1137)  Weight change:  Filed Weights   09/01/17 1137  Weight: 68 kg (150 lb)    Intake/Output: No intake/output data recorded.   Intake/Output this shift:  No intake/output data recorded.  Physical Exam: General: No acute distress  Head: Normocephalic, atraumatic. Moist oral mucosal membranes  Eyes: Anicteric  Neck: Supple, trachea midline  Lungs:  Clear to auscultation, normal effort  Heart: S1S2 no rubs  Abdomen:  Soft, nontender, bowel sounds present, distension notede  Extremities: Trace peripheral edema.  Neurologic: Awake, alert, following commands  Skin: No lesions  Access: R IJ permcath    Basic Metabolic Panel: Recent Labs  Lab 08/28/17 1516  NA 136  K 4.9  CL 99*  CO2 24  GLUCOSE 159*  BUN 54*  CREATININE 8.55*  CALCIUM 8.8*  PHOS 4.3    Liver Function Tests: Recent Labs  Lab 08/28/17 1516  ALBUMIN 2.8*   No results for input(s): LIPASE, AMYLASE in the last 168 hours. No results for input(s): AMMONIA in the last 168 hours.  CBC: Recent Labs  Lab 08/28/17 1516  WBC 5.6  HGB 10.0*  HCT 32.8*  MCV 81.0  PLT 229    Cardiac Enzymes: No results for input(s): CKTOTAL, CKMB, CKMBINDEX, TROPONINI in the last 168 hours.  BNP: Invalid input(s): POCBNP  CBG: No results for input(s): GLUCAP in the last 168 hours.  Microbiology: Results for orders placed or performed during the hospital encounter of 07/12/17   Culture, blood (Routine X 2) w Reflex to ID Panel     Status: None   Collection Time: 07/12/17  4:43 PM  Result Value Ref Range Status   Specimen Description BLOOD LINE CENTRAL  Final   Special Requests   Final    BOTTLES DRAWN AEROBIC AND ANAEROBIC Blood Culture adequate volume   Culture   Final    NO GROWTH 5 DAYS Performed at [facility], [address] [place], [place] [address]    Report Status 07/17/2017 FINAL  Final    Coagulation Studies: No results for input(s): LABPROT, INR in the last 72 hours.  Urinalysis: No results for input(s): COLORURINE, LABSPEC, PHURINE, GLUCOSEU, HGBUR, BILIRUBINUR, KETONESUR, PROTEINUR, UROBILINOGEN, NITRITE, LEUKOCYTESUR in the last 72 hours.  Invalid input(s): APPERANCEUR    Imaging: No results found.   Medications:   . sodium chloride    . sodium chloride     . Chlorhexidine Gluconate Cloth  6 each Topical Q0600  . hydrOXYzine  10 mg Oral Once in dialysis   sodium chloride, sodium chloride, alteplase, heparin  Assessment/ Plan:  57 y.o. male withend stage renal disease on hemodialysis secondary to Alport's syndrome, ascites, hypertension, anemia of chronic kidney disease, coronary artery disease, peripheral vascular disease, hyperlipidemia, gastrointestinal AVMs, pulmonary hypertension  nooutpatientdialysis unit currently  1. Hyperkalemia Most recent serum potassium was down to 4.9 however patient missed dialysis on Wednesday.  We plan to recheck serum potassium today.  2.  End-stage renal disease:   Tolerating dialysis at the moment.  3. Ascites Patient last had paracentesis on August 23, 2017.  We will continue to monitor for additional paracentesis.  4.  Anemia of CKD:   Hemoglobin up to 10.0.  Maintain the patient on Epogen..   5.  Secondary hyperparathyroidism: Recheck serum phosphorus today.     LOS: 0 Makya Yurko 6/21/201912:23 PM

## 2017-09-01 NOTE — ED Provider Notes (Addendum)
-----------------------------------------   4:13 PM on 09/01/2017 -----------------------------------------  Pt back from dialysis wants to go. No complaints. Looks well vss.    Schuyler Amor, MD 09/01/17 1613    Schuyler Amor, MD 09/01/17 [phone]

## 2017-09-01 NOTE — ED Notes (Signed)
[place] on [place] not working

## 2017-09-01 NOTE — ED Provider Notes (Signed)
[facility] Emergency Department Provider Note   ____________________________________________    I have reviewed the triage vital signs and the nursing notes.   HISTORY  Chief Complaint Vascular Access Problem     HPI Stanley [name] is a 57 y.o. male is here for dialysis.  Patient comes to the emergency department Monday Wednesdays and Fridays for dialysis because he is apparently no longer allowed to go to outpatient dialysis centers because of his behavior.  He has no complaints today.  No shortness of breath.   Past Medical History:  Diagnosis Date  . Alcohol abuse   . CHF (congestive heart failure) ([place])   . Cirrhosis ([place])   . Coronary artery disease 2009  . Drug abuse ([place])   . End stage renal disease on dialysis [facility]) NEPHROLOGIST-   DR [facility]  IN [place]   HEMODIALYSIS --   TUES/  THURS/  SAT  . Gastrointestinal bleed 06/13/2017   From chart...hx of multiple GI bleeds  . GERD (gastroesophageal reflux disease)   . Hyperlipidemia   . Hypertension   . PAD (peripheral artery disease) ([place])   . Renal insufficiency    Per pt, 32 oz fluid restriction per day  . S/P triple vessel bypass 06/09/2016   2009ish  . Suicidal ideation    & HOMICIDAL IDEATION --  06-16-2013   ADMITTED TO BEHAVIOR HEALTH    Patient Active Problem List   Diagnosis Date Noted  . ESRD (end stage renal disease) on dialysis ([place]) 07/28/2017  . Protein-calorie malnutrition, severe 06/14/2017  . Encounter for dialysis [facility])   . Palliative care by specialist   . Goals of care, counseling/discussion   . Malnutrition of moderate degree 06/05/2017  . Secondary esophageal varices without bleeding ([place])   . Stomach irritation   . Idiopathic esophageal varices without bleeding ([place])   . Alcoholic hepatitis with ascites 05/24/2017  . ESRD (end stage renal disease) ([place]) 04/28/2017  . Uremia 03/08/2017  . ESRD on hemodialysis ([place]) 03/03/2017  . Weakness 02/28/2017   . Hypocalcemia 02/22/2017  . Shortness of breath 11/26/2016  . COPD (chronic obstructive pulmonary disease) ([place]) 10/30/2016  . COPD exacerbation ([place]) 10/29/2016  . Anemia   . Heme positive stool   . Ulceration of intestine   . Benign neoplasm of transverse colon   . Acute gastrointestinal hemorrhage   . Esophageal candidiasis ([place])   . Angiodysplasia of intestinal tract   . Acute respiratory failure with hypoxia ([place]) 07/03/2016  . GI bleeding 06/24/2016  . Rectal bleeding 06/14/2016  . Anemia of chronic disease 06/01/2016  . MRSA carrier 06/01/2016  . Chronic renal failure 05/23/2016  . Ischemic heart disease 05/23/2016  . Angiodysplasia of small intestine   . Melena   . Small bowel bleed not requiring more than 4 units of blood in 24 hours, ICU, or surgery   . Anemia due to chronic blood loss   . Abdominal pain 05/05/2016  . Acute posthemorrhagic anemia 04/17/2016  . Gastrointestinal bleed 04/17/2016  . History of esophagogastroduodenoscopy (EGD) 04/17/2016  . Elevated troponin 04/17/2016  . Alcohol abuse 04/17/2016  . Upper GI bleed 01/19/2016  . Blood in stool   . Angiodysplasia of stomach and duodenum with hemorrhage   . Gastritis   . Reflux esophagitis   . GI bleed 05/16/2015  . Acute GI bleeding   . Symptomatic anemia 04/30/2015  . HTN (hypertension) 04/06/2015  . GERD (gastroesophageal reflux disease) 04/06/2015  . HLD (hyperlipidemia) 04/06/2015  .  Dyspnea 04/06/2015  . Cirrhosis of liver with ascites ([place]) 04/06/2015  . Ascites 04/06/2015  . GIB (gastrointestinal bleeding) 03/23/2015  . Homicidal ideation 06/19/2013  . Suicidal intent 06/19/2013  . Homicidal ideations 06/19/2013  . Hyperkalemia 06/16/2013  . Mandible fracture ([place]) 06/05/2013  . Fracture, mandible ([place]) 06/02/2013  . Coronary atherosclerosis of native coronary artery 06/02/2013  . ESRD on dialysis ([place]) 06/02/2013  . Mandible open fracture ([place]) 06/02/2013    Past Surgical History:    Procedure Laterality Date  . A/V FISTULAGRAM Right 06/06/2017   Procedure: A/V FISTULAGRAM;  Surgeon: Katha Cabal, MD;  Location: [place] CV LAB;  Service: Cardiovascular;  Laterality: Right;  . A/V SHUNT INTERVENTION N/A 06/06/2017   Procedure: A/V SHUNT INTERVENTION;  Surgeon: Katha Cabal, MD;  Location: [place] CV LAB;  Service: Cardiovascular;  Laterality: N/A;  . AGILE CAPSULE N/A 06/19/2016   Procedure: AGILE CAPSULE;  Surgeon: [name], MD;  Location: [facility] ENDOSCOPY;  Service: Endoscopy;  Laterality: N/A;  . COLONOSCOPY WITH PROPOFOL N/A 06/18/2016   Procedure: COLONOSCOPY WITH PROPOFOL;  Surgeon: [name], MD;  Location: [facility] ENDOSCOPY;  Service: Endoscopy;  Laterality: N/A;  . COLONOSCOPY WITH PROPOFOL N/A 08/12/2016   Procedure: COLONOSCOPY WITH PROPOFOL;  Surgeon: [name], MD;  Location: [facility] ENDOSCOPY;  Service: Endoscopy;  Laterality: N/A;  . COLONOSCOPY WITH PROPOFOL N/A 05/05/2017   Procedure: COLONOSCOPY WITH PROPOFOL;  Surgeon: Manya Silvas, MD;  Location: [facility] END[facility]COPY;  Service: Endoscopy;  Laterality: N/A;  . CORONARY ANGIOPLASTY  ?   PT UNABLE TO TELL IF  BEFORE OR AFTER  CABG  . CORONARY ARTERY BYPASS GRAFT  2008  ([place])   3 VESSEL  . DIALYSIS FISTULA CREATION  LAST SURGERY  APPOX  2008  . ENTEROSCOPY N/A 05/10/2016   Procedure: ENTEROSCOPY;  Surgeon: Jerene Bears, MD;  Location: [place];  Service: Gastroenterology;  Laterality: N/A;  . ENTEROSCOPY N/A 08/12/2016   Procedure: ENTEROSCOPY;  Surgeon: [name], MD;  Location: [facility] ENDOSCOPY;  Service: Endoscopy;  Laterality: N/A;  . ENTEROSCOPY Left 06/03/2017   Procedure: ENTEROSCOPY;  Surgeon: Virgel Manifold, MD;  Location: [facility] ENDOSCOPY;  Service: Endoscopy;  Laterality: Left;  Procedure date will ultimately depend on when patient is medically optimized before the procedure, pending hemodialysis and blood transfusions etc. Will place on schedule and change depending  on clinical status.   . ENTEROSCOPY N/A 06/05/2017   Procedure: ENTEROSCOPY;  Surgeon: Virgel Manifold, MD;  Location: [facility] ENDOSCOPY;  Service: Endoscopy;  Laterality: N/A;  . ENTEROSCOPY N/A 06/15/2017   Procedure: Push ENTEROSCOPY;  Surgeon: [name], MD;  Location: [facility] ENDOSCOPY;  Service: Endoscopy;  Laterality: N/A;  . ESOPHAGOGASTRODUODENOSCOPY N/A 05/07/2015   Procedure: ESOPHAGOGASTRODUODENOSCOPY (EGD);  Surgeon: Hulen Luster, MD;  Location: [facility] ENDOSCOPY;  Service: Endoscopy;  Laterality: N/A;  . ESOPHAGOGASTRODUODENOSCOPY (EGD) WITH PROPOFOL N/A 05/17/2015   Procedure: ESOPHAGOGASTRODUODENOSCOPY (EGD) WITH PROPOFOL;  Surgeon: [name], MD;  Location: [facility] ENDOSCOPY;  Service: Endoscopy;  Laterality: N/A;  . ESOPHAGOGASTRODUODENOSCOPY (EGD) WITH PROPOFOL N/A 01/20/2016   Procedure: ESOPHAGOGASTRODUODENOSCOPY (EGD) WITH PROPOFOL;  Surgeon: [name], MD;  Location: [facility] ENDOSCOPY;  Service: Endoscopy;  Laterality: N/A;  . ESOPHAGOGASTRODUODENOSCOPY (EGD) WITH PROPOFOL N/A 04/17/2016   Procedure: ESOPHAGOGASTRODUODENOSCOPY (EGD) WITH PROPOFOL;  Surgeon: Lin Landsman, MD;  Location: [facility] ENDOSCOPY;  Service: Gastroenterology;  Laterality: N/A;  . ESOPHAGOGASTRODUODENOSCOPY (EGD) WITH PROPOFOL  05/09/2016   Procedure: ESOPHAGOGASTRODUODENOSCOPY (EGD) WITH PROPOFOL;  Surgeon: Jerene Bears, MD;  Location: [facility] ENDOSCOPY;  Service: Endoscopy;;  . ESOPHAGOGASTRODUODENOSCOPY (EGD) WITH PROPOFOL N/A 06/16/2016   Procedure: ESOPHAGOGASTRODUODENOSCOPY (EGD) WITH PROPOFOL;  Surgeon: [name], MD;  Location: [facility] ENDOSCOPY;  Service: Endoscopy;  Laterality: N/A;  . ESOPHAGOGASTRODUODENOSCOPY (EGD) WITH PROPOFOL N/A 05/05/2017   Procedure: ESOPHAGOGASTRODUODENOSCOPY (EGD) WITH PROPOFOL;  Surgeon: Manya Silvas, MD;  Location: [facility] ENDOSCOPY;  Service: Endoscopy;  Laterality: N/A;  . ESOPHAGOGASTRODUODENOSCOPY (EGD) WITH PROPOFOL N/A 06/15/2017   Procedure: ESOPHAGOGASTRODUODENOSCOPY (EGD) WITH  PROPOFOL;  Surgeon: [name], MD;  Location: [facility] ENDOSCOPY;  Service: Endoscopy;  Laterality: N/A;  . GIVENS CAPSULE STUDY N/A 05/07/2016   Procedure: GIVENS CAPSULE STUDY;  Surgeon: Doran Stabler, MD;  Location: [place];  Service: Endoscopy;  Laterality: N/A;  . MANDIBULAR HARDWARE REMOVAL N/A 07/29/2013   Procedure: REMOVAL OF ARCH BARS;  Surgeon: Theodoro Kos, DO;  Location: [place];  Service: Plastics;  Laterality: N/A;  . ORIF MANDIBULAR FRACTURE N/A 06/05/2013   Procedure: REPAIR OF MANDIBULAR FRACTURE x 2 with maxillo-mandibular fixation ;  Surgeon: Theodoro Kos, DO;  Location: [place];  Service: Plastics;  Laterality: N/A;  . PERIPHERAL ARTERIAL STENT GRAFT Left     Prior to Admission medications   Medication Sig Start Date End Date Taking? Authorizing Provider  albuterol (PROVENTIL HFA;VENTOLIN HFA) 108 (90 Base) MCG/ACT inhaler Inhale 2 puffs into the lungs every 6 (six) hours as needed for wheezing or shortness of breath. 04/21/17   Lisa Roca, MD  alum & mag hydroxide-simeth (MAALOX/MYLANTA) 200-200-20 MG/5ML suspension Take 15 mLs every 4 (four) hours as needed by mouth for indigestion or heartburn. Patient not taking: Reported on 04/19/2017 01/25/17   Demetrios Loll, MD  atorvastatin (LIPITOR) 40 MG tablet Take 1 tablet (40 mg total) by mouth daily. 11/16/16   Vaughan Basta, MD  budesonide-formoterol (SYMBICORT) 160-4.5 MCG/ACT inhaler Inhale 2 puffs into the lungs daily. 11/16/16   Vaughan Basta, MD  calcium acetate (PHOSLO) 667 MG capsule Take 2,001 mg by mouth 3 (three) times daily.    [provider]  doxycycline (VIBRA-TABS) 100 MG tablet Take 1 tablet (100 mg total) by mouth 2 (two) times daily. Patient not taking: Reported on 08/28/2017 07/10/17   Harvest Dark, MD  ferrous sulfate 325 (65 FE) MG tablet Take 1 tablet by mouth 3 (three) times daily. 01/21/17   [provider]  furosemide (LASIX) 80 MG tablet Take 1  tablet (80 mg total) daily by mouth. 01/25/17   Demetrios Loll, MD  gabapentin (NEURONTIN) 300 MG capsule Take 1 capsule by mouth daily as directed 08/16/16   [provider]  ipratropium-albuterol (DUONEB) 0.5-2.5 (3) MG/3ML SOLN Take 3 mLs by nebulization every 6 (six) hours as needed. 11/28/16   Loletha Grayer, MD  labetalol (NORMODYNE) 100 MG tablet Take 100 mg by mouth daily. 03/24/17   [provider]  Multiple Vitamins-Minerals-FA (DIALYVITE SUPREME D) 3 MG TABS Take 1 tablet (3 mg total) by mouth daily. 11/28/16   Loletha Grayer, MD  nitroGLYCERIN (NITROSTAT) 0.4 MG SL tablet Place 1 tablet (0.4 mg total) under the tongue every 5 (five) minutes as needed. 04/12/16   Wende Bushy, MD  pantoprazole (PROTONIX) 40 MG tablet Take 1 tablet (40 mg total) by mouth daily. 11/28/16   Loletha Grayer, MD  Spacer/Aero Chamber Mouthpiece MISC 1 Units by Does not apply route every 4 (four) hours as needed (wheezing). 02/19/17   Darel Hong, MD  tiotropium (SPIRIVA HANDIHALER) 18 MCG inhalation capsule Place 1 capsule (18 mcg  total) into inhaler and inhale daily. 11/16/16 11/16/17  Vaughan Basta, MD     Allergies Patient has no known allergies.  Family History  Problem Relation Age of Onset  . Colon cancer Mother   . Cancer Father   . Cancer Sister   . Kidney disease Brother     Social History Social History   Tobacco Use  . Smoking status: Current Every Day Smoker    Packs/day: 0.15    Years: 40.00    Pack years: 6.00    Types: Cigarettes  . Smokeless tobacco: Never Used  Substance Use Topics  . Alcohol use: No    Comment: pt reports quitting after learning about cirrhosis  . Drug use: No    Frequency: 7.0 times per week    Types: Marijuana, Cocaine    Review of Systems  Constitutional: No fever/chills  ENT: No sore throat.  Gastrointestinal: No abdominal pain.  No nausea, no vomiting.     Skin: Negative for rash. Neurological: Negative for headaches       ____________________________________________   PHYSICAL EXAM:  VITAL SIGNS: ED Triage Vitals  Enc Vitals Group     BP 09/01/17 1136 114/73     Pulse Rate 09/01/17 1136 82     Resp 09/01/17 1136 20     Temp 09/01/17 1136 98.4 F (36.9 C)     Temp Source 09/01/17 1136 Oral     SpO2 09/01/17 1136 99 %     Weight 09/01/17 1137 68 kg (150 lb)     Height 09/01/17 1137 1.905 m (6\' 3" )     Head Circumference --      Peak Flow --      Pain Score 09/01/17 1137 0     Pain Loc --      Pain Edu? --      Excl. in [place]? --      Constitutional: Alert and oriented. No acute distress. Eyes: Conjunctivae are normal.   Nose: No congestion/rhinnorhea.  Cardiovascular: Normal rate, regular rhythm.  Respiratory: Normal respiratory effort.  No retractions.   Neurologic:  Normal speech and language. No gross focal neurologic deficits are appreciated.   Skin:  Skin is warm, dry and intact. No rash noted.   ____________________________________________   LABS (all labs ordered are listed, but only abnormal results are displayed)  Labs Reviewed  RENAL FUNCTION PANEL  CBC  PHOSPHORUS   ____________________________________________  EKG   ____________________________________________  RADIOLOGY  None ____________________________________________   PROCEDURES  Procedure(s) performed: No  Procedures   Critical Care performed: No ____________________________________________   INITIAL IMPRESSION / ASSESSMENT AND PLAN / ED COURSE  Pertinent labs & imaging results that were available during my care of the patient were reviewed by me and considered in my medical decision making (see chart for details).  Patient well-appearing in no acute distress, vitals normal.  To dialysis   ____________________________________________   FINAL CLINICAL IMPRESSION(S) / ED DIAGNOSES  Encounter for dialysis   NEW MEDICATIONS STARTED DURING THIS VISIT:  Current Discharge  Medication List       Note:  This document was prepared using Dragon voice recognition software and may include unintentional dictation errors.    Lavonia Drafts, MD 09/01/17 [phone]

## 2017-09-01 NOTE — Progress Notes (Signed)
This note also relates to the following rows which could not be included: Pulse Rate - Cannot attach notes to unvalidated device data Resp - Cannot attach notes to unvalidated device data BP - Cannot attach notes to unvalidated device data  Hd complete  

## 2017-09-01 NOTE — Progress Notes (Signed)
This note also relates to the following rows which could not be included: BP - Cannot attach notes to unvalidated device data  Hd started

## 2017-09-01 NOTE — Progress Notes (Signed)
Post dialysis assessment 

## 2017-09-01 NOTE — ED Notes (Signed)
ED Provider at bedside. 

## 2017-09-01 NOTE — ED Triage Notes (Signed)
Pt here for dialysis, denies pain or other concerns or symtpoms

## 2017-09-04 ENCOUNTER — Non-Acute Institutional Stay
Admission: EM | Admit: 2017-09-04 | Discharge: 2017-09-04 | Disposition: A | Discharge status: Home / Self Care | Payer: Medicare Other | Attending: Emergency Medicine | Admitting: Emergency Medicine

## 2017-09-04 ENCOUNTER — Encounter: Payer: Self-pay | Admitting: Emergency Medicine

## 2017-09-04 ENCOUNTER — Ambulatory Visit
Admission: RE | Admit: 2017-09-04 | Discharge: 2017-09-04 | Disposition: A | Discharge status: Home / Self Care | Payer: Medicare Other | Source: Ambulatory Visit | Attending: Vascular Surgery | Admitting: Vascular Surgery

## 2017-09-04 DIAGNOSIS — Z951 Presence of aortocoronary bypass graft: Secondary | ICD-10-CM | POA: Insufficient documentation

## 2017-09-04 DIAGNOSIS — R188 Other ascites: Secondary | ICD-10-CM | POA: Insufficient documentation

## 2017-09-04 DIAGNOSIS — I509 Heart failure, unspecified: Secondary | ICD-10-CM | POA: Insufficient documentation

## 2017-09-04 DIAGNOSIS — N186 End stage renal disease: Secondary | ICD-10-CM | POA: Diagnosis not present

## 2017-09-04 DIAGNOSIS — I132 Hypertensive heart and chronic kidney disease with heart failure and with stage 5 chronic kidney disease, or end stage renal disease: Secondary | ICD-10-CM | POA: Diagnosis not present

## 2017-09-04 DIAGNOSIS — Z992 Dependence on renal dialysis: Secondary | ICD-10-CM | POA: Diagnosis present

## 2017-09-04 DIAGNOSIS — D631 Anemia in chronic kidney disease: Secondary | ICD-10-CM | POA: Insufficient documentation

## 2017-09-04 DIAGNOSIS — J449 Chronic obstructive pulmonary disease, unspecified: Secondary | ICD-10-CM | POA: Insufficient documentation

## 2017-09-04 DIAGNOSIS — K31811 Angiodysplasia of stomach and duodenum with bleeding: Secondary | ICD-10-CM | POA: Insufficient documentation

## 2017-09-04 DIAGNOSIS — I739 Peripheral vascular disease, unspecified: Secondary | ICD-10-CM | POA: Insufficient documentation

## 2017-09-04 DIAGNOSIS — K210 Gastro-esophageal reflux disease with esophagitis: Secondary | ICD-10-CM | POA: Diagnosis not present

## 2017-09-04 DIAGNOSIS — K7460 Unspecified cirrhosis of liver: Secondary | ICD-10-CM | POA: Diagnosis not present

## 2017-09-04 DIAGNOSIS — I2510 Atherosclerotic heart disease of native coronary artery without angina pectoris: Secondary | ICD-10-CM | POA: Insufficient documentation

## 2017-09-04 DIAGNOSIS — F17210 Nicotine dependence, cigarettes, uncomplicated: Secondary | ICD-10-CM | POA: Diagnosis not present

## 2017-09-04 DIAGNOSIS — I272 Pulmonary hypertension, unspecified: Secondary | ICD-10-CM | POA: Diagnosis not present

## 2017-09-04 DIAGNOSIS — N2581 Secondary hyperparathyroidism of renal origin: Secondary | ICD-10-CM | POA: Diagnosis not present

## 2017-09-04 DIAGNOSIS — Z800 Family history of malignant neoplasm of digestive organs: Secondary | ICD-10-CM | POA: Insufficient documentation

## 2017-09-04 DIAGNOSIS — Z79899 Other long term (current) drug therapy: Secondary | ICD-10-CM | POA: Diagnosis not present

## 2017-09-04 DIAGNOSIS — Z87448 Personal history of other diseases of urinary system: Secondary | ICD-10-CM | POA: Diagnosis not present

## 2017-09-04 DIAGNOSIS — I120 Hypertensive chronic kidney disease with stage 5 chronic kidney disease or end stage renal disease: Secondary | ICD-10-CM | POA: Diagnosis not present

## 2017-09-04 DIAGNOSIS — E875 Hyperkalemia: Secondary | ICD-10-CM | POA: Diagnosis not present

## 2017-09-04 DIAGNOSIS — E785 Hyperlipidemia, unspecified: Secondary | ICD-10-CM | POA: Diagnosis present

## 2017-09-04 MED ORDER — EPOETIN ALFA 10000 UNIT/ML IJ SOLN
10000.0000 [IU] | INTRAMUSCULAR | Status: DC
  Administered 2017-09-04: 10000 [IU] via INTRAVENOUS

## 2017-09-04 MED ORDER — CHLORHEXIDINE GLUCONATE CLOTH 2 % EX PADS
6.0000 | MEDICATED_PAD | Freq: Every day | CUTANEOUS | Status: DC
  Filled 2017-09-04: qty 6

## 2017-09-04 NOTE — Progress Notes (Signed)
[facility]  ROUNDING NOTE   Subjective:   Seen and examined on hemodialysis. Tolerating treatment well.    Objective:  Vital signs in last 24 hours:  Temp:  [98.1 F (36.7 C)-98.3 F (36.8 C)] 98.1 F (36.7 C) (06/24 1025) Pulse Rate:  [75-85] 79 (06/24 1330) Resp:  [14-24] 16 (06/24 1330) BP: (128-161)/(79-106) 132/106 (06/24 1330) SpO2:  [100 %] 100 % (06/24 1330) Weight:  [72.3 kg (159 lb 6.3 oz)-72.6 kg (160 lb)] 72.3 kg (159 lb 6.3 oz) (06/24 1025)  Weight change:  Filed Weights   09/04/17 1002 09/04/17 1025  Weight: 72.6 kg (160 lb) 72.3 kg (159 lb 6.3 oz)    Intake/Output: No intake/output data recorded.   Intake/Output this shift:  No intake/output data recorded.  Physical Exam: General: No acute distress  Head: Normocephalic, atraumatic. Moist oral mucosal membranes  Eyes: Anicteric  Neck: Supple, trachea midline  Lungs:  Clear to auscultation, normal effort  Heart: S1S2 no rubs  Abdomen:  Soft, nontender, bowel sounds present, distension notede  Extremities: No peripheral edema.  Neurologic: Awake, alert, following commands  Skin: No lesions  Access: R IJ permcath    Basic Metabolic Panel: Recent Labs  Lab 08/28/17 1516 09/01/17 1300  NA 136 136  K 4.9 4.4  CL 99* 97*  CO2 24 30  GLUCOSE 159* 132*  BUN 54* 38*  CREATININE 8.55* 6.29*  CALCIUM 8.8* 8.6*  PHOS 4.3 1.4*  2.3*    Liver Function Tests: Recent Labs  Lab 08/28/17 1516 09/01/17 1300  ALBUMIN 2.8* 3.3*   No results for input(s): LIPASE, AMYLASE in the last 168 hours. No results for input(s): AMMONIA in the last 168 hours.  CBC: Recent Labs  Lab 08/28/17 1516 09/01/17 1300  WBC 5.6 7.7  HGB 10.0* 10.3*  HCT 32.8* 33.6*  MCV 81.0 79.9*  PLT 229 274    Cardiac Enzymes: No results for input(s): CKTOTAL, CKMB, CKMBINDEX, TROPONINI in the last 168 hours.  BNP: Invalid input(s): POCBNP  CBG: No results for input(s): GLUCAP in the last 168  hours.  Microbiology: Results for orders placed or performed during the hospital encounter of 07/12/17  Culture, blood (Routine X 2) w Reflex to ID Panel     Status: None   Collection Time: 07/12/17  4:43 PM  Result Value Ref Range Status   Specimen Description BLOOD LINE CENTRAL  Final   Special Requests   Final    BOTTLES DRAWN AEROBIC AND ANAEROBIC Blood Culture adequate volume   Culture   Final    NO GROWTH 5 DAYS Performed at [facility], [address] [place], [place] [address]    Report Status 07/17/2017 FINAL  Final    Coagulation Studies: No results for input(s): LABPROT, INR in the last 72 hours.  Urinalysis: No results for input(s): COLORURINE, LABSPEC, PHURINE, GLUCOSEU, HGBUR, BILIRUBINUR, KETONESUR, PROTEINUR, UROBILINOGEN, NITRITE, LEUKOCYTESUR in the last 72 hours.  Invalid input(s): APPERANCEUR    Imaging: No results found.   Medications:    . Chlorhexidine Gluconate Cloth  6 each Topical Q0600  . epoetin (EPOGEN/PROCRIT) injection  10,000 Units Intravenous Q M,W,F-HD     Assessment/ Plan:  57 y.o. black male withend stage renal disease on hemodialysis secondary to Alport's syndrome, ascites, hypertension, anemia of chronic kidney disease, coronary artery disease, peripheral vascular disease, hyperlipidemia, gastrointestinal AVMs, pulmonary hypertension  nooutpatientdialysis unit currently  1. Hyperkalemia - 2K bath on hemodialysis  2.  End-stage renal disease:   Tolerating  dialysis treatment  3. Ascites: status post paracentesis on August 23, 2017  4.  Anemia of CKD:    - EPO  5.  Secondary hyperparathyroidism:  - Continue binders     LOS: 0 Chanse Kagel 6/24/20191:56 PM

## 2017-09-04 NOTE — ED Provider Notes (Signed)
[facility] Emergency Department Provider Note   ____________________________________________   First MD Initiated Contact with Patient 09/04/17 1005     (approximate)  I have reviewed the triage vital signs and the nursing notes.   HISTORY  Chief Complaint Vascular Access Problem    HPI Stanley [name] is a 57 y.o. male Patient gets diaysis routinely MWF last was F he here now. Feels well Dialysis ready. He to dialysis  Past Medical History:  Diagnosis Date  . Alcohol abuse   . CHF (congestive heart failure) ([place])   . Cirrhosis ([place])   . Coronary artery disease 2009  . Drug abuse ([place])   . End stage renal disease on dialysis [facility]) NEPHROLOGIST-   DR [facility]  IN [place]   HEMODIALYSIS --   TUES/  THURS/  SAT  . Gastrointestinal bleed 06/13/2017   From chart...hx of multiple GI bleeds  . GERD (gastroesophageal reflux disease)   . Hyperlipidemia   . Hypertension   . PAD (peripheral artery disease) ([place])   . Renal insufficiency    Per pt, 32 oz fluid restriction per day  . S/P triple vessel bypass 06/09/2016   2009ish  . Suicidal ideation    & HOMICIDAL IDEATION --  06-16-2013   ADMITTED TO BEHAVIOR HEALTH    Patient Active Problem List   Diagnosis Date Noted  . ESRD (end stage renal disease) on dialysis ([place]) 07/28/2017  . Protein-calorie malnutrition, severe 06/14/2017  . Encounter for dialysis [facility])   . Palliative care by specialist   . Goals of care, counseling/discussion   . Malnutrition of moderate degree 06/05/2017  . Secondary esophageal varices without bleeding ([place])   . Stomach irritation   . Idiopathic esophageal varices without bleeding ([place])   . Alcoholic hepatitis with ascites 05/24/2017  . ESRD (end stage renal disease) ([place]) 04/28/2017  . Uremia 03/08/2017  . ESRD on hemodialysis ([place]) 03/03/2017  . Weakness 02/28/2017  . Hypocalcemia 02/22/2017  . Shortness of breath 11/26/2016  . COPD (chronic obstructive  pulmonary disease) ([place]) 10/30/2016  . COPD exacerbation ([place]) 10/29/2016  . Anemia   . Heme positive stool   . Ulceration of intestine   . Benign neoplasm of transverse colon   . Acute gastrointestinal hemorrhage   . Esophageal candidiasis ([place])   . Angiodysplasia of intestinal tract   . Acute respiratory failure with hypoxia ([place]) 07/03/2016  . GI bleeding 06/24/2016  . Rectal bleeding 06/14/2016  . Anemia of chronic disease 06/01/2016  . MRSA carrier 06/01/2016  . Chronic renal failure 05/23/2016  . Ischemic heart disease 05/23/2016  . Angiodysplasia of small intestine   . Melena   . Small bowel bleed not requiring more than 4 units of blood in 24 hours, ICU, or surgery   . Anemia due to chronic blood loss   . Abdominal pain 05/05/2016  . Acute posthemorrhagic anemia 04/17/2016  . Gastrointestinal bleed 04/17/2016  . History of esophagogastroduodenoscopy (EGD) 04/17/2016  . Elevated troponin 04/17/2016  . Alcohol abuse 04/17/2016  . Upper GI bleed 01/19/2016  . Blood in stool   . Angiodysplasia of stomach and duodenum with hemorrhage   . Gastritis   . Reflux esophagitis   . GI bleed 05/16/2015  . Acute GI bleeding   . Symptomatic anemia 04/30/2015  . HTN (hypertension) 04/06/2015  . GERD (gastroesophageal reflux disease) 04/06/2015  . HLD (hyperlipidemia) 04/06/2015  . Dyspnea 04/06/2015  . Cirrhosis of liver with ascites ([place]) 04/06/2015  . Ascites 04/06/2015  .  GIB (gastrointestinal bleeding) 03/23/2015  . Homicidal ideation 06/19/2013  . Suicidal intent 06/19/2013  . Homicidal ideations 06/19/2013  . Hyperkalemia 06/16/2013  . Mandible fracture ([place]) 06/05/2013  . Fracture, mandible ([place]) 06/02/2013  . Coronary atherosclerosis of native coronary artery 06/02/2013  . ESRD on dialysis ([place]) 06/02/2013  . Mandible open fracture ([place]) 06/02/2013    Past Surgical History:  Procedure Laterality Date  . A/V FISTULAGRAM Right 06/06/2017   Procedure: A/V  FISTULAGRAM;  Surgeon: Katha Cabal, MD;  Location: [place] CV LAB;  Service: Cardiovascular;  Laterality: Right;  . A/V SHUNT INTERVENTION N/A 06/06/2017   Procedure: A/V SHUNT INTERVENTION;  Surgeon: Katha Cabal, MD;  Location: [place] CV LAB;  Service: Cardiovascular;  Laterality: N/A;  . AGILE CAPSULE N/A 06/19/2016   Procedure: AGILE CAPSULE;  Surgeon: [name], MD;  Location: [facility] ENDOSCOPY;  Service: Endoscopy;  Laterality: N/A;  . COLONOSCOPY WITH PROPOFOL N/A 06/18/2016   Procedure: COLONOSCOPY WITH PROPOFOL;  Surgeon: [name], MD;  Location: [facility] ENDOSCOPY;  Service: Endoscopy;  Laterality: N/A;  . COLONOSCOPY WITH PROPOFOL N/A 08/12/2016   Procedure: COLONOSCOPY WITH PROPOFOL;  Surgeon: [name], MD;  Location: [facility] ENDOSCOPY;  Service: Endoscopy;  Laterality: N/A;  . COLONOSCOPY WITH PROPOFOL N/A 05/05/2017   Procedure: COLONOSCOPY WITH PROPOFOL;  Surgeon: Manya Silvas, MD;  Location: [facility] ENDOSCOPY;  Service: Endoscopy;  Laterality: N/A;  . CORONARY ANGIOPLASTY  ?   PT UNABLE TO TELL IF  BEFORE OR AFTER  CABG  . CORONARY ARTERY BYPASS GRAFT  2008  ([place] , )   3 VESSEL  . DIALYSIS FISTULA CREATION  LAST SURGERY  APPOX  2008  . ENTEROSCOPY N/A 05/10/2016   Procedure: ENTEROSCOPY;  Surgeon: Jerene Bears, MD;  Location: [place];  Service: Gastroenterology;  Laterality: N/A;  . ENTEROSCOPY N/A 08/12/2016   Procedure: ENTEROSCOPY;  Surgeon: [name], MD;  Location: [facility] ENDOSCOPY;  Service: Endoscopy;  Laterality: N/A;  . ENTEROSCOPY Left 06/03/2017   Procedure: ENTEROSCOPY;  Surgeon: Virgel Manifold, MD;  Location: [facility] ENDOSCOPY;  Service: Endoscopy;  Laterality: Left;  Procedure date will ultimately depend on when patient is medically optimized before the procedure, pending hemodialysis and blood transfusions etc. Will place on schedule and change depending on clinical status.   . ENTEROSCOPY N/A 06/05/2017   Procedure: ENTEROSCOPY;   Surgeon: Virgel Manifold, MD;  Location: [facility] ENDOSCOPY;  Service: Endoscopy;  Laterality: N/A;  . ENTEROSCOPY N/A 06/15/2017   Procedure: Push ENTEROSCOPY;  Surgeon: [name], MD;  Location: [facility] ENDOSCOPY;  Service: Endoscopy;  Laterality: N/A;  . ESOPHAGOGASTRODUODENOSCOPY N/A 05/07/2015   Procedure: ESOPHAGOGASTRODUODENOSCOPY (EGD);  Surgeon: Hulen Luster, MD;  Location: [facility] ENDOSCOPY;  Service: Endoscopy;  Laterality: N/A;  . ESOPHAGOGASTRODUODENOSCOPY (EGD) WITH PROPOFOL N/A 05/17/2015   Procedure: ESOPHAGOGASTRODUODENOSCOPY (EGD) WITH PROPOFOL;  Surgeon: [name], MD;  Location: [facility] ENDOSCOPY;  Service: Endoscopy;  Laterality: N/A;  . ESOPHAGOGASTRODUODENOSCOPY (EGD) WITH PROPOFOL N/A 01/20/2016   Procedure: ESOPHAGOGASTRODUODENOSCOPY (EGD) WITH PROPOFOL;  Surgeon: [name], MD;  Location: [facility] ENDOSCOPY;  Service: Endoscopy;  Laterality: N/A;  . ESOPHAGOGASTRODUODENOSCOPY (EGD) WITH PROPOFOL N/A 04/17/2016   Procedure: ESOPHAGOGASTRODUODENOSCOPY (EGD) WITH PROPOFOL;  Surgeon: Lin Landsman, MD;  Location: [facility] ENDOSCOPY;  Service: Gastroenterology;  Laterality: N/A;  . ESOPHAGOGASTRODUODENOSCOPY (EGD) WITH PROPOFOL  05/09/2016   Procedure: ESOPHAGOGASTRODUODENOSCOPY (EGD) WITH PROPOFOL;  Surgeon: Jerene Bears, MD;  Location: [facility] ENDOSCOPY;  Service: Endoscopy;;  . ESOPHAGOGASTRODUODENOSCOPY (EGD) WITH PROPOFOL N/A 06/16/2016   Procedure:  ESOPHAGOGASTRODUODENOSCOPY (EGD) WITH PROPOFOL;  Surgeon: [name], MD;  Location: [facility] ENDOSCOPY;  Service: Endoscopy;  Laterality: N/A;  . ESOPHAGOGASTRODUODENOSCOPY (EGD) WITH PROPOFOL N/A 05/05/2017   Procedure: ESOPHAGOGASTRODUODENOSCOPY (EGD) WITH PROPOFOL;  Surgeon: Manya Silvas, MD;  Location: [facility] ENDOSCOPY;  Service: Endoscopy;  Laterality: N/A;  . ESOPHAGOGASTRODUODENOSCOPY (EGD) WITH PROPOFOL N/A 06/15/2017   Procedure: ESOPHAGOGASTRODUODENOSCOPY (EGD) WITH PROPOFOL;  Surgeon: [name], MD;  Location: [facility] ENDOSCOPY;  Service:  Endoscopy;  Laterality: N/A;  . GIVENS CAPSULE STUDY N/A 05/07/2016   Procedure: GIVENS CAPSULE STUDY;  Surgeon: Doran Stabler, MD;  Location: [place];  Service: Endoscopy;  Laterality: N/A;  . MANDIBULAR HARDWARE REMOVAL N/A 07/29/2013   Procedure: REMOVAL OF ARCH BARS;  Surgeon: Theodoro Kos, DO;  Location: [place];  Service: Plastics;  Laterality: N/A;  . ORIF MANDIBULAR FRACTURE N/A 06/05/2013   Procedure: REPAIR OF MANDIBULAR FRACTURE x 2 with maxillo-mandibular fixation ;  Surgeon: Theodoro Kos, DO;  Location: [place];  Service: Plastics;  Laterality: N/A;  . PERIPHERAL ARTERIAL STENT GRAFT Left     Prior to Admission medications   Medication Sig Start Date End Date Taking? Authorizing Provider  albuterol (PROVENTIL HFA;VENTOLIN HFA) 108 (90 Base) MCG/ACT inhaler Inhale 2 puffs into the lungs every 6 (six) hours as needed for wheezing or shortness of breath. 04/21/17   Lisa Roca, MD  alum & mag hydroxide-simeth (MAALOX/MYLANTA) 200-200-20 MG/5ML suspension Take 15 mLs every 4 (four) hours as needed by mouth for indigestion or heartburn. Patient not taking: Reported on 04/19/2017 01/25/17   Demetrios Loll, MD  atorvastatin (LIPITOR) 40 MG tablet Take 1 tablet (40 mg total) by mouth daily. 11/16/16   Vaughan Basta, MD  budesonide-formoterol (SYMBICORT) 160-4.5 MCG/ACT inhaler Inhale 2 puffs into the lungs daily. 11/16/16   Vaughan Basta, MD  calcium acetate (PHOSLO) 667 MG capsule Take 2,001 mg by mouth 3 (three) times daily.    [provider]  doxycycline (VIBRA-TABS) 100 MG tablet Take 1 tablet (100 mg total) by mouth 2 (two) times daily. Patient not taking: Reported on 08/28/2017 07/10/17   Harvest Dark, MD  ferrous sulfate 325 (65 FE) MG tablet Take 1 tablet by mouth 3 (three) times daily. 01/21/17   [provider]  furosemide (LASIX) 80 MG tablet Take 1 tablet (80 mg total) daily by mouth. 01/25/17   Demetrios Loll, MD  gabapentin  (NEURONTIN) 300 MG capsule Take 1 capsule by mouth daily as directed 08/16/16   [provider]  ipratropium-albuterol (DUONEB) 0.5-2.5 (3) MG/3ML SOLN Take 3 mLs by nebulization every 6 (six) hours as needed. 11/28/16   Loletha Grayer, MD  labetalol (NORMODYNE) 100 MG tablet Take 100 mg by mouth daily. 03/24/17   [provider]  Multiple Vitamins-Minerals-FA (DIALYVITE SUPREME D) 3 MG TABS Take 1 tablet (3 mg total) by mouth daily. 11/28/16   Loletha Grayer, MD  nitroGLYCERIN (NITROSTAT) 0.4 MG SL tablet Place 1 tablet (0.4 mg total) under the tongue every 5 (five) minutes as needed. 04/12/16   Wende Bushy, MD  pantoprazole (PROTONIX) 40 MG tablet Take 1 tablet (40 mg total) by mouth daily. 11/28/16   Loletha Grayer, MD  Spacer/Aero Chamber Mouthpiece MISC 1 Units by Does not apply route every 4 (four) hours as needed (wheezing). 02/19/17   Darel Hong, MD  tiotropium (SPIRIVA HANDIHALER) 18 MCG inhalation capsule Place 1 capsule (18 mcg total) into inhaler and inhale daily. 11/16/16 11/16/17  Vaughan Basta, MD    Allergies Patient  has no known allergies.  Family History  Problem Relation Age of Onset  . Colon cancer Mother   . Cancer Father   . Cancer Sister   . Kidney disease Brother     Social History Social History   Tobacco Use  . Smoking status: Current Every Day Smoker    Packs/day: 0.15    Years: 40.00    Pack years: 6.00    Types: Cigarettes  . Smokeless tobacco: Never Used  Substance Use Topics  . Alcohol use: No    Comment: pt reports quitting after learning about cirrhosis  . Drug use: No    Frequency: 7.0 times per week    Types: Marijuana, Cocaine    Review of Systems  Constitutional: No fever/chills Eyes: No visual changes. ENT: No sore throat. Cardiovascular: Denies chest pain. Respiratory: Denies shortness of breath. Gastrointestinal: No abdominal pain.  No nausea, no vomiting.  No diarrhea.  No constipation. Genitourinary:  Negative for dysuria. Musculoskeletal: Negative for back pain. Skin: Negative for rash. Neurological: Negative for headaches, focal weakness    ____________________________________________   PHYSICAL EXAM:  VITAL SIGNS: ED Triage Vitals  Enc Vitals Group     BP 09/04/17 1006 134/89     Pulse Rate 09/04/17 1006 75     Resp 09/04/17 1006 18     Temp 09/04/17 1006 98.3 F (36.8 C)     Temp Source 09/04/17 1006 Oral     SpO2 09/04/17 1006 100 %     Weight 09/04/17 1002 160 lb (72.6 kg)     Height 09/04/17 1002 6\' 3"  (1.905 m)     Head Circumference --      Peak Flow --      Pain Score 09/04/17 1002 0     Pain Loc --      Pain Edu? --      Excl. in [place]? --     Constitutional: Alert and oriented. Well appearing and in no acute distress. Eyes: Conjunctivae are normal. Head: Atraumatic. Nose: No congestion/rhinnorhea. Mouth/Throat: Mucous membranes are moist.  Oropharynx non-erythematous. Neck: No stridor. Cardiovascular: Normal rate, regular rhythm. Grossly normal heart sounds.  Good peripheral circulation. Respiratory: Normal respiratory effort.  No retractions. Lungs CTAB. Gastrointestinal: Soft and nontender. No distention. No abdominal bruits. No CVA tenderness. Musculoskeletal: No lower extremity tenderness nor edema.   Neurologic:  Normal speech and language. No gross focal neurologic deficits are appreciated. No gait instability. Skin:  Skin is warm, dry and intact. No rash noted. Psychiatric: Mood and affect are normal. Speech and behavior are normal.  ____________________________________________   LABS (all labs ordered are listed, but only abnormal results are displayed)  Labs Reviewed - No data to display ____________________________________________  EKG   ____________________________________________  RADIOLOGY  ED MD interpretatio  Official radiology report(s): No results  found.  ____________________________________________   PROCEDURES  Procedure(s) performed:   Procedures  Critical Care performed:   ____________________________________________   INITIAL IMPRESSION / ASSESSMENT AND PLAN  ED COURSE       ____________________________________________   FINAL CLINICAL IMPRESSION(S) / ED DIAGNOSES  Final diagnoses:  Dialysis patient [facility])     ED Discharge Orders    None       Note:  This document was prepared using Dragon voice recognition software and may include unintentional dictation errors.    Nena Polio, MD 09/04/17 1019

## 2017-09-04 NOTE — ED Notes (Signed)
Pt taken to HD by EDT in wheelchair

## 2017-09-04 NOTE — Discharge Instructions (Addendum)
Return for dialysis as planned.  Return also for any other problems.

## 2017-09-04 NOTE — ED Triage Notes (Signed)
Pt here for dialysis. Denies concerns or issues.

## 2017-09-06 ENCOUNTER — Other Ambulatory Visit: Payer: Medicare Other

## 2017-09-06 ENCOUNTER — Other Ambulatory Visit: Payer: Self-pay

## 2017-09-06 ENCOUNTER — Encounter: Payer: Self-pay | Admitting: Emergency Medicine

## 2017-09-06 ENCOUNTER — Emergency Department
Admission: EM | Admit: 2017-09-06 | Discharge: 2017-09-06 | Disposition: A | Discharge status: Home / Self Care | Payer: Medicare Other | Attending: Student in an Organized Health Care Education/Training Program | Admitting: Student in an Organized Health Care Education/Training Program

## 2017-09-06 ENCOUNTER — Ambulatory Visit: Payer: Medicare Other

## 2017-09-06 DIAGNOSIS — I2510 Atherosclerotic heart disease of native coronary artery without angina pectoris: Secondary | ICD-10-CM | POA: Insufficient documentation

## 2017-09-06 DIAGNOSIS — Z79899 Other long term (current) drug therapy: Secondary | ICD-10-CM | POA: Diagnosis not present

## 2017-09-06 DIAGNOSIS — F17210 Nicotine dependence, cigarettes, uncomplicated: Secondary | ICD-10-CM | POA: Insufficient documentation

## 2017-09-06 DIAGNOSIS — D649 Anemia, unspecified: Secondary | ICD-10-CM | POA: Insufficient documentation

## 2017-09-06 DIAGNOSIS — N186 End stage renal disease: Secondary | ICD-10-CM | POA: Diagnosis not present

## 2017-09-06 DIAGNOSIS — I1311 Hypertensive heart and chronic kidney disease without heart failure, with stage 5 chronic kidney disease, or end stage renal disease: Secondary | ICD-10-CM | POA: Diagnosis not present

## 2017-09-06 DIAGNOSIS — Z4931 Encounter for adequacy testing for hemodialysis: Secondary | ICD-10-CM | POA: Diagnosis present

## 2017-09-06 DIAGNOSIS — N2581 Secondary hyperparathyroidism of renal origin: Secondary | ICD-10-CM | POA: Diagnosis not present

## 2017-09-06 DIAGNOSIS — D631 Anemia in chronic kidney disease: Secondary | ICD-10-CM | POA: Diagnosis not present

## 2017-09-06 DIAGNOSIS — E875 Hyperkalemia: Secondary | ICD-10-CM | POA: Diagnosis not present

## 2017-09-06 DIAGNOSIS — Z992 Dependence on renal dialysis: Secondary | ICD-10-CM | POA: Diagnosis not present

## 2017-09-06 LAB — CBC
HCT: 32.9 % — ABNORMAL LOW (ref 40.0–52.0)
Hemoglobin: 10 g/dL — ABNORMAL LOW (ref 13.0–18.0)
MCH: 24 pg — AB (ref 26.0–34.0)
MCHC: 30.4 g/dL — ABNORMAL LOW (ref 32.0–36.0)
MCV: 78.9 fL — ABNORMAL LOW (ref 80.0–100.0)
PLATELETS: 338 10*3/uL (ref 150–440)
RBC: 4.16 MIL/uL — AB (ref 4.40–5.90)
RDW: 20.9 % — ABNORMAL HIGH (ref 11.5–14.5)
WBC: 5.8 10*3/uL (ref 3.8–10.6)

## 2017-09-06 LAB — RENAL FUNCTION PANEL
Albumin: 3.1 g/dL — ABNORMAL LOW (ref 3.5–5.0)
Anion gap: 12 (ref 5–15)
BUN: 45 mg/dL — ABNORMAL HIGH (ref 6–20)
CALCIUM: 8.4 mg/dL — AB (ref 8.9–10.3)
CO2: 27 mmol/L (ref 22–32)
CREATININE: 8.82 mg/dL — AB (ref 0.61–1.24)
Chloride: 97 mmol/L — ABNORMAL LOW (ref 98–111)
GFR calc non Af Amer: 6 mL/min — ABNORMAL LOW (ref >60)
GFR, EST AFRICAN AMERICAN: 7 mL/min — AB (ref >60)
Glucose, Bld: 108 mg/dL — ABNORMAL HIGH (ref 70–99)
Phosphorus: 5 mg/dL — ABNORMAL HIGH (ref 2.5–4.6)
Potassium: 5.5 mmol/L — ABNORMAL HIGH (ref 3.5–5.1)
SODIUM: 136 mmol/L (ref 135–145)

## 2017-09-06 MED ORDER — SODIUM CHLORIDE 0.9 % IV SOLN: 100.0000 mL | INTRAVENOUS | Status: DC | PRN

## 2017-09-06 MED ORDER — CHLORHEXIDINE GLUCONATE CLOTH 2 % EX PADS: 6.0000 | MEDICATED_PAD | Freq: Every day | CUTANEOUS | Status: DC

## 2017-09-06 MED ORDER — PENTAFLUOROPROP-TETRAFLUOROETH EX AERO: 1.0000 "application " | INHALATION_SPRAY | CUTANEOUS | Status: DC | PRN

## 2017-09-06 MED ORDER — HEPARIN SODIUM (PORCINE) 1000 UNIT/ML DIALYSIS
1000.0000 [IU] | INTRAMUSCULAR | Status: DC | PRN
  Filled 2017-09-06: qty 1

## 2017-09-06 MED ORDER — LIDOCAINE-PRILOCAINE 2.5-2.5 % EX CREA: 1.0000 "application " | TOPICAL_CREAM | CUTANEOUS | Status: DC | PRN

## 2017-09-06 MED ORDER — GI COCKTAIL ~~LOC~~
30.0000 mL | Freq: Once | ORAL | Status: AC
  Administered 2017-09-06: 30 mL via ORAL
  Filled 2017-09-06: qty 30

## 2017-09-06 MED ORDER — EPOETIN ALFA 10000 UNIT/ML IJ SOLN
10000.0000 [IU] | Freq: Once | INTRAMUSCULAR | Status: AC
  Administered 2017-09-06: 10000 [IU] via SUBCUTANEOUS
  Filled 2017-09-06 (×2): qty 1

## 2017-09-06 MED ORDER — LIDOCAINE HCL (PF) 1 % IJ SOLN: 5.0000 mL | INTRAMUSCULAR | Status: DC | PRN

## 2017-09-06 MED ORDER — ALTEPLASE 2 MG IJ SOLR
2.0000 mg | Freq: Once | INTRAMUSCULAR | Status: DC | PRN
  Filled 2017-09-06: qty 2

## 2017-09-06 MED ORDER — DIPHENHYDRAMINE HCL 50 MG/ML IJ SOLN
25.0000 mg | Freq: Once | INTRAMUSCULAR | Status: AC
  Administered 2017-09-06: 25 mg via INTRAVENOUS

## 2017-09-06 NOTE — Progress Notes (Signed)
Post HD assessment. Pt tolerated tx well without c/o or complication. Pt requested to be taken off tcx early,MD aware. Net UF 2515, goal not met.    09/06/17 1700  Vital Signs  Temp 97.9 F (36.6 C)  Temp Source Oral  Pulse Rate 81  Pulse Rate Source Monitor  Resp (!) 21  BP 117/72  BP Location Left Arm  BP Method Automatic  Patient Position (if appropriate) Sitting  Oxygen Therapy  SpO2 100 %  O2 Device Room Air  Dialysis Weight  Weight 70.7 kg (155 lb 13.8 oz)  Type of Weight Post-Dialysis  Post-Hemodialysis Assessment  Rinseback Volume (mL) 250 mL  KECN 40.5 V  Dialyzer Clearance Lightly streaked  Duration of HD Treatment -hour(s) 2.5 hour(s)  Hemodialysis Intake (mL) 500 mL  UF Total -Machine (mL) 3015 mL  Net UF (mL) 2515 mL  Tolerated HD Treatment Yes  Education / Care Plan  Dialysis Education Provided Yes  Documented Education in Care Plan Yes  Hemodialysis Catheter Right Internal jugular Double-lumen  No Placement Date or Time found.   Placed prior to admission: Yes  Orientation: Right  Access Location: Internal jugular  Hemodialysis Catheter Type: Double-lumen  Site Condition No complications  Blue Lumen Status Heparin locked  Red Lumen Status Heparin locked  Purple Lumen Status N/A  Catheter fill solution Heparin 1000 units/ml  Catheter fill volume (Arterial) 1.5 cc  Catheter fill volume (Venous) 1.5  Dressing Type Biopatch  Dressing Status Clean;Dry;Intact  Drainage Description None  Post treatment catheter status Capped and Clamped

## 2017-09-06 NOTE — ED Notes (Signed)
Pt was transported back to ED by nurse tech. Pt then left AMA without signing any paperwork or having vitals taken. Pt NAD upon leaving.

## 2017-09-06 NOTE — ED Triage Notes (Signed)
Pt comes into the ED via POV for his dialysis.  Patient has no complaints at this time.

## 2017-09-06 NOTE — ED Notes (Signed)
Pt here for regular dialysis. Informed by Talmage Coin RN that he will be going down in about an hour and a half for dialysis. Currently another pt down there at this time. Pt verbalized understanding. Provided coffee.

## 2017-09-06 NOTE — Progress Notes (Signed)
Post HD assessment    09/06/17 1659  Neurological  Level of Consciousness Alert  Orientation Level Oriented X4  Respiratory  Respiratory Pattern Regular;Unlabored  Chest Assessment Chest expansion symmetrical  Cardiac  ECG Monitor Yes  Cardiac Rhythm NSR  Vascular  R Radial Pulse +2  L Radial Pulse +2  Integumentary  Integumentary (WDL) X  Skin Color Appropriate for ethnicity  Musculoskeletal  Musculoskeletal (WDL) X  Generalized Weakness Yes  Assistive Device None  GU Assessment  Genitourinary (WDL) X  Genitourinary Symptoms  (HD)  Psychosocial  Psychosocial (WDL) WDL

## 2017-09-06 NOTE — Progress Notes (Signed)
Pharmacy contacted to bring pt's medication to dialysis, to be given during HD tx.    09/06/17 1325  Hand-Off documentation  Report received from (Full Name) Stark Bray

## 2017-09-06 NOTE — Progress Notes (Signed)
Pre HD assessment   09/06/17 1405  Vital Signs  Temp 98 F (36.7 C)  Temp Source Oral  Pulse Rate 80  Pulse Rate Source Monitor  Resp (!) 21  BP 134/79  BP Location Left Arm  BP Method Automatic  Patient Position (if appropriate) Sitting  Oxygen Therapy  SpO2 100 %  O2 Device Room Air  Pain Assessment  Pain Scale 0-10  Pain Score 0  Dialysis Weight  Weight 72 kg (158 lb 11.7 oz)  Type of Weight Pre-Dialysis  Time-Out for Hemodialysis  What Procedure? HD  Pt Identifiers(min of two) First/Last Name;MRN/Account#  Correct Site? Yes  Correct Side? Yes  Correct Procedure? Yes  Consents Verified? Yes  Rad Studies Available? N/A  Safety Precautions Reviewed? Yes  Engineer, civil (consulting) Number  (1A)  Station Number 3  UF/Alarm Test Passed  Conductivity: Meter 13.6  Conductivity: Machine  13.6  pH 7.6  Reverse Osmosis main  Normal Saline Lot Number [id number]  Dialyzer Lot Number [id number]  Disposable Set Lot Number [id number]  Machine Temperature 98.6 F (37 C)  Musician and Audible Yes  Blood Lines Intact and Secured Yes  Pre Treatment Patient Checks  Vascular access used during treatment Catheter  Patient is receiving dialysis in a chair Yes  Hepatitis B Surface Antigen Results Negative  Date Hepatitis B Surface Antigen Drawn 06/16/17  Hepatitis B Surface Antibody  (>10)  Date Hepatitis B Surface Antibody Drawn 06/16/17  Hemodialysis Consent Verified Yes  Hemodialysis Standing Orders Initiated Yes  ECG (Telemetry) Monitor On Yes  Prime Ordered Normal Saline  Length of  DialysisTreatment -hour(s) 3.5 Hour(s)  Dialyzer Elisio 17H NR  Dialysate 2K, 2.5 Ca  Dialysis Anticoagulant None  Dialysate Flow Ordered 800  Blood Flow Rate Ordered 400 mL/min  Ultrafiltration Goal 3 Liters  Pre Treatment Labs Renal panel;CBC  Dialysis Blood Pressure Support Ordered Normal Saline  Education / Care Plan  Dialysis Education Provided Yes  Documented Education in Care Plan  Yes  Hemodialysis Catheter Right Internal jugular Double-lumen  No Placement Date or Time found.   Placed prior to admission: Yes  Orientation: Right  Access Location: Internal jugular  Hemodialysis Catheter Type: Double-lumen  Site Condition No complications  Blue Lumen Status Heparin locked  Red Lumen Status Heparin locked  Purple Lumen Status N/A  Dressing Type Biopatch  Dressing Status Clean;Dry;Intact  Drainage Description None

## 2017-09-06 NOTE — Progress Notes (Signed)
HD tx start    09/06/17 1416  Vital Signs  Pulse Rate 78  Pulse Rate Source Monitor  Resp 14  BP 128/72  BP Location Left Arm  BP Method Automatic  Patient Position (if appropriate) Lying  Oxygen Therapy  SpO2 100 %  O2 Device Room Air  During Hemodialysis Assessment  Blood Flow Rate (mL/min) 400 mL/min  Arterial Pressure (mmHg) -160 mmHg  Venous Pressure (mmHg) 180 mmHg  Transmembrane Pressure (mmHg) 40 mmHg  Ultrafiltration Rate (mL/min) 1200 mL/min  Dialysate Flow Rate (mL/min) 800 ml/min  Conductivity: Machine  13.6  HD Safety Checks Performed Yes  Dialysis Fluid Bolus Normal Saline  Bolus Amount (mL) 250 mL  Intra-Hemodialysis Comments Tx initiated  Hemodialysis Catheter Right Internal jugular Double-lumen  No Placement Date or Time found.   Placed prior to admission: Yes  Orientation: Right  Access Location: Internal jugular  Hemodialysis Catheter Type: Double-lumen  Blue Lumen Status Infusing  Red Lumen Status Infusing

## 2017-09-06 NOTE — Progress Notes (Signed)
[facility]  ROUNDING NOTE   Subjective:   Seen and examined on hemodialysis. Tolerating treatment well.    Objective:  Vital signs in last 24 hours:  Temp:  [97.9 F (36.6 C)-98 F (36.7 C)] 98 F (36.7 C) (06/26 1405) Pulse Rate:  [78-81] 81 (06/26 1515) Resp:  [12-21] 13 (06/26 1515) BP: (127-155)/(72-90) 142/87 (06/26 1515) SpO2:  [91 %-100 %] 100 % (06/26 1515) Weight:  [72 kg (158 lb 11.7 oz)-72.1 kg (159 lb)] 72 kg (158 lb 11.7 oz) (06/26 1405)  Weight change:  Filed Weights   09/06/17 1207 09/06/17 1405  Weight: 72.1 kg (159 lb) 72 kg (158 lb 11.7 oz)    Intake/Output: No intake/output data recorded.   Intake/Output this shift:  No intake/output data recorded.  Physical Exam: General: No acute distress  Head: Normocephalic, atraumatic. Moist oral mucosal membranes  Eyes: Anicteric  Neck: Supple, trachea midline  Lungs:  Clear to auscultation, normal effort  Heart: S1S2 no rubs  Abdomen:  Soft, nontender, bowel sounds present, distension notede  Extremities: No peripheral edema.  Neurologic: Awake, alert, following commands  Skin: No lesions  Access: R IJ permcath    Basic Metabolic Panel: Recent Labs  Lab 09/01/17 1300  NA 136  K 4.4  CL 97*  CO2 30  GLUCOSE 132*  BUN 38*  CREATININE 6.29*  CALCIUM 8.6*  PHOS 1.4*  2.3*    Liver Function Tests: Recent Labs  Lab 09/01/17 1300  ALBUMIN 3.3*   No results for input(s): LIPASE, AMYLASE in the last 168 hours. No results for input(s): AMMONIA in the last 168 hours.  CBC: Recent Labs  Lab 09/01/17 1300  WBC 7.7  HGB 10.3*  HCT 33.6*  MCV 79.9*  PLT 274    Cardiac Enzymes: No results for input(s): CKTOTAL, CKMB, CKMBINDEX, TROPONINI in the last 168 hours.  BNP: Invalid input(s): POCBNP  CBG: No results for input(s): GLUCAP in the last 168 hours.  Microbiology: Results for orders placed or performed during the hospital encounter of 07/12/17  Culture, blood  (Routine X 2) w Reflex to ID Panel     Status: None   Collection Time: 07/12/17  4:43 PM  Result Value Ref Range Status   Specimen Description BLOOD LINE CENTRAL  Final   Special Requests   Final    BOTTLES DRAWN AEROBIC AND ANAEROBIC Blood Culture adequate volume   Culture   Final    NO GROWTH 5 DAYS Performed at [facility], [address] [place], [place] [address]    Report Status 07/17/2017 FINAL  Final    Coagulation Studies: No results for input(s): LABPROT, INR in the last 72 hours.  Urinalysis: No results for input(s): COLORURINE, LABSPEC, PHURINE, GLUCOSEU, HGBUR, BILIRUBINUR, KETONESUR, PROTEINUR, UROBILINOGEN, NITRITE, LEUKOCYTESUR in the last 72 hours.  Invalid input(s): APPERANCEUR    Imaging: No results found.   Medications:   . sodium chloride    . sodium chloride     . [START ON 09/07/2017] Chlorhexidine Gluconate Cloth  6 each Topical Q0600     Assessment/ Plan:  57 y.o. black male withend stage renal disease on hemodialysis secondary to Alport's syndrome, ascites, hypertension, anemia of chronic kidney disease, coronary artery disease, peripheral vascular disease, hyperlipidemia, gastrointestinal AVMs, pulmonary hypertension  nooutpatientdialysis unit currently  1. Hyperkalemia: pending today's labs.  - 2K bath on hemodialysis  2.  End-stage renal disease:   Tolerating dialysis treatment  3. Ascites: status post paracentesis on August 23, 2017  4.  Anemia of CKD:    - EPO with HD treatment 10000 units.   5.  Secondary hyperparathyroidism:  - Continue binders  6. Dialysis access: Vein mapping has been done   LOS: 0 [place] 6/26/20193:33 PM

## 2017-09-06 NOTE — Progress Notes (Signed)
Pre HD assessment    09/06/17 1406  Neurological  Level of Consciousness Alert  Orientation Level Oriented X4  Respiratory  Respiratory Pattern Regular;Unlabored  Chest Assessment Chest expansion symmetrical  Cardiac  ECG Monitor Yes  Cardiac Rhythm NSR  Vascular  R Radial Pulse +2  L Radial Pulse +2  Integumentary  Integumentary (WDL) X  Skin Color Appropriate for ethnicity  Musculoskeletal  Musculoskeletal (WDL) X  Generalized Weakness Yes  Assistive Device None  GU Assessment  Genitourinary (WDL) X  Genitourinary Symptoms  (HD)  Psychosocial  Psychosocial (WDL) WDL

## 2017-09-06 NOTE — ED Provider Notes (Signed)
[facility] Emergency Department Provider Note    None    (approximate)  I have reviewed the triage vital signs and the nursing notes.   HISTORY  Chief Complaint Dialysis    HPI Stanley [name] is a 57 y.o. male here for routine dialysis.  Patient feels well.  No other complaints.    Past Medical History:  Diagnosis Date  . Alcohol abuse   . CHF (congestive heart failure) ([place])   . Cirrhosis ([place])   . Coronary artery disease 2009  . Drug abuse ([place])   . End stage renal disease on dialysis [facility]) NEPHROLOGIST-   DR [facility]  IN [place]   HEMODIALYSIS --   TUES/  THURS/  SAT  . Gastrointestinal bleed 06/13/2017   From chart...hx of multiple GI bleeds  . GERD (gastroesophageal reflux disease)   . Hyperlipidemia   . Hypertension   . PAD (peripheral artery disease) ([place])   . Renal insufficiency    Per pt, 32 oz fluid restriction per day  . S/P triple vessel bypass 06/09/2016   2009ish  . Suicidal ideation    & HOMICIDAL IDEATION --  06-16-2013   ADMITTED TO BEHAVIOR HEALTH   Family History  Problem Relation Age of Onset  . Colon cancer Mother   . Cancer Father   . Cancer Sister   . Kidney disease Brother    Past Surgical History:  Procedure Laterality Date  . A/V FISTULAGRAM Right 06/06/2017   Procedure: A/V FISTULAGRAM;  Surgeon: Katha Cabal, MD;  Location: [place] [facility];  Service: Cardiovascular;  Laterality: Right;  . A/V SHUNT INTERVENTION N/A 06/06/2017   Procedure: A/V SHUNT INTERVENTION;  Surgeon: Katha Cabal, MD;  Location: [place] [facility];  Service: Cardiovascular;  Laterality: N/A;  . AGILE CAPSULE N/A 06/19/2016   Procedure: AGILE CAPSULE;  Surgeon: [name], MD;  Location: [facility] ENDOSCOPY;  Service: Endoscopy;  Laterality: N/A;  . COLONOSCOPY WITH PROPOFOL N/A 06/18/2016   Procedure: COLONOSCOPY WITH PROPOFOL;  Surgeon: [name], MD;  Location: [facility] ENDOSCOPY;  Service: Endoscopy;  Laterality: N/A;  .  COLONOSCOPY WITH PROPOFOL N/A 08/12/2016   Procedure: COLONOSCOPY WITH PROPOFOL;  Surgeon: [name], MD;  Location: [facility] ENDOSCOPY;  Service: Endoscopy;  Laterality: N/A;  . COLONOSCOPY WITH PROPOFOL N/A 05/05/2017   Procedure: COLONOSCOPY WITH PROPOFOL;  Surgeon: Manya Silvas, MD;  Location: [facility] ENDOSCOPY;  Service: Endoscopy;  Laterality: N/A;  . CORONARY ANGIOPLASTY  ?   PT UNABLE TO TELL IF  BEFORE OR AFTER  CABG  . CORONARY ARTERY BYPASS GRAFT  2008  ([place] , [place])   3 VESSEL  . DIALYSIS FISTULA CREATION  LAST SURGERY  APPOX  2008  . ENTEROSCOPY N/A 05/10/2016   Procedure: ENTEROSCOPY;  Surgeon: Jerene Bears, MD;  Location: [place];  Service: Gastroenterology;  Laterality: N/A;  . ENTEROSCOPY N/A 08/12/2016   Procedure: ENTEROSCOPY;  Surgeon: [name], MD;  Location: [facility] ENDOSCOPY;  Service: Endoscopy;  Laterality: N/A;  . ENTEROSCOPY Left 06/03/2017   Procedure: ENTEROSCOPY;  Surgeon: Virgel Manifold, MD;  Location: [facility] ENDOSCOPY;  Service: Endoscopy;  Laterality: Left;  Procedure date will ultimately depend on when patient is medically optimized before the procedure, pending hemodialysis and blood transfusions etc. Will place on schedule and change depending on clinical status.   . ENTEROSCOPY N/A 06/05/2017   Procedure: ENTEROSCOPY;  Surgeon: Virgel Manifold, MD;  Location: [facility] ENDOSCOPY;  Service: Endoscopy;  Laterality: N/A;  .  ENTEROSCOPY N/A 06/15/2017   Procedure: Push ENTEROSCOPY;  Surgeon: [name], MD;  Location: [facility] ENDOSCOPY;  Service: Endoscopy;  Laterality: N/A;  . ESOPHAGOGASTRODUODENOSCOPY N/A 05/07/2015   Procedure: ESOPHAGOGASTRODUODENOSCOPY (EGD);  Surgeon: Hulen Luster, MD;  Location: [facility] ENDOSCOPY;  Service: Endoscopy;  Laterality: N/A;  . ESOPHAGOGASTRODUODENOSCOPY (EGD) WITH PROPOFOL N/A 05/17/2015   Procedure: ESOPHAGOGASTRODUODENOSCOPY (EGD) WITH PROPOFOL;  Surgeon: [name], MD;  Location: [facility] ENDOSCOPY;  Service: Endoscopy;   Laterality: N/A;  . ESOPHAGOGASTRODUODENOSCOPY (EGD) WITH PROPOFOL N/A 01/20/2016   Procedure: ESOPHAGOGASTRODUODENOSCOPY (EGD) WITH PROPOFOL;  Surgeon: [name], MD;  Location: [facility] ENDOSCOPY;  Service: Endoscopy;  Laterality: N/A;  . ESOPHAGOGASTRODUODENOSCOPY (EGD) WITH PROPOFOL N/A 04/17/2016   Procedure: ESOPHAGOGASTRODUODENOSCOPY (EGD) WITH PROPOFOL;  Surgeon: Lin Landsman, MD;  Location: [facility] ENDOSCOPY;  Service: Gastroenterology;  Laterality: N/A;  . ESOPHAGOGASTRODUODENOSCOPY (EGD) WITH PROPOFOL  05/09/2016   Procedure: ESOPHAGOGASTRODUODENOSCOPY (EGD) WITH PROPOFOL;  Surgeon: Jerene Bears, MD;  Location: [place];  Service: Endoscopy;;  . ESOPHAGOGASTRODUODENOSCOPY (EGD) WITH PROPOFOL N/A 06/16/2016   Procedure: ESOPHAGOGASTRODUODENOSCOPY (EGD) WITH PROPOFOL;  Surgeon: [name], MD;  Location: [facility] ENDOSCOPY;  Service: Endoscopy;  Laterality: N/A;  . ESOPHAGOGASTRODUODENOSCOPY (EGD) WITH PROPOFOL N/A 05/05/2017   Procedure: ESOPHAGOGASTRODUODENOSCOPY (EGD) WITH PROPOFOL;  Surgeon: Manya Silvas, MD;  Location: [facility] ENDOSCOPY;  Service: Endoscopy;  Laterality: N/A;  . ESOPHAGOGASTRODUODENOSCOPY (EGD) WITH PROPOFOL N/A 06/15/2017   Procedure: ESOPHAGOGASTRODUODENOSCOPY (EGD) WITH PROPOFOL;  Surgeon: [name], MD;  Location: [facility] ENDOSCOPY;  Service: Endoscopy;  Laterality: N/A;  . GIVENS CAPSULE STUDY N/A 05/07/2016   Procedure: GIVENS CAPSULE STUDY;  Surgeon: Doran Stabler, MD;  Location: [place];  Service: Endoscopy;  Laterality: N/A;  . MANDIBULAR HARDWARE REMOVAL N/A 07/29/2013   Procedure: REMOVAL OF ARCH BARS;  Surgeon: Theodoro Kos, DO;  Location: [place];  Service: Plastics;  Laterality: N/A;  . ORIF MANDIBULAR FRACTURE N/A 06/05/2013   Procedure: REPAIR OF MANDIBULAR FRACTURE x 2 with maxillo-mandibular fixation ;  Surgeon: Theodoro Kos, DO;  Location: [place];  Service: Plastics;  Laterality: N/A;  . PERIPHERAL ARTERIAL STENT GRAFT Left     Patient Active Problem List   Diagnosis Date Noted  . ESRD (end stage renal disease) on dialysis ([place]) 07/28/2017  . Protein-calorie malnutrition, severe 06/14/2017  . Encounter for dialysis [facility])   . Palliative care by specialist   . Goals of care, counseling/discussion   . Malnutrition of moderate degree 06/05/2017  . Secondary esophageal varices without bleeding ([place])   . Stomach irritation   . Idiopathic esophageal varices without bleeding ([place])   . Alcoholic hepatitis with ascites 05/24/2017  . ESRD (end stage renal disease) ([place]) 04/28/2017  . Uremia 03/08/2017  . ESRD on hemodialysis ([place]) 03/03/2017  . Weakness 02/28/2017  . Hypocalcemia 02/22/2017  . Shortness of breath 11/26/2016  . COPD (chronic obstructive pulmonary disease) ([place]) 10/30/2016  . COPD exacerbation ([place]) 10/29/2016  . Anemia   . Heme positive stool   . Ulceration of intestine   . Benign neoplasm of transverse colon   . Acute gastrointestinal hemorrhage   . Esophageal candidiasis ([place])   . Angiodysplasia of intestinal tract   . Acute respiratory failure with hypoxia ([place]) 07/03/2016  . GI bleeding 06/24/2016  . Rectal bleeding 06/14/2016  . Anemia of chronic disease 06/01/2016  . MRSA carrier 06/01/2016  . Chronic renal failure 05/23/2016  . Ischemic heart disease 05/23/2016  . Angiodysplasia of small intestine   . Melena   . Small  bowel bleed not requiring more than 4 units of blood in 24 hours, ICU, or surgery   . Anemia due to chronic blood loss   . Abdominal pain 05/05/2016  . Acute posthemorrhagic anemia 04/17/2016  . Gastrointestinal bleed 04/17/2016  . History of esophagogastroduodenoscopy (EGD) 04/17/2016  . Elevated troponin 04/17/2016  . Alcohol abuse 04/17/2016  . Upper GI bleed 01/19/2016  . Blood in stool   . Angiodysplasia of stomach and duodenum with hemorrhage   . Gastritis   . Reflux esophagitis   . GI bleed 05/16/2015  . Acute GI bleeding   . Symptomatic anemia  04/30/2015  . HTN (hypertension) 04/06/2015  . GERD (gastroesophageal reflux disease) 04/06/2015  . HLD (hyperlipidemia) 04/06/2015  . Dyspnea 04/06/2015  . Cirrhosis of liver with ascites ([place]) 04/06/2015  . Ascites 04/06/2015  . GIB (gastrointestinal bleeding) 03/23/2015  . Homicidal ideation 06/19/2013  . Suicidal intent 06/19/2013  . Homicidal ideations 06/19/2013  . Hyperkalemia 06/16/2013  . Mandible fracture ([place]) 06/05/2013  . Fracture, mandible ([place]) 06/02/2013  . Coronary atherosclerosis of native coronary artery 06/02/2013  . ESRD on dialysis ([place]) 06/02/2013  . Mandible open fracture ([place]) 06/02/2013      Prior to Admission medications   Medication Sig Start Date End Date Taking? Authorizing Provider  albuterol (PROVENTIL HFA;VENTOLIN HFA) 108 (90 Base) MCG/ACT inhaler Inhale 2 puffs into the lungs every 6 (six) hours as needed for wheezing or shortness of breath. 04/21/17   Lisa Roca, MD  alum & mag hydroxide-simeth (MAALOX/MYLANTA) 200-200-20 MG/5ML suspension Take 15 mLs every 4 (four) hours as needed by mouth for indigestion or heartburn. Patient not taking: Reported on 04/19/2017 01/25/17   Demetrios Loll, MD  atorvastatin (LIPITOR) 40 MG tablet Take 1 tablet (40 mg total) by mouth daily. 11/16/16   Vaughan Basta, MD  budesonide-formoterol (SYMBICORT) 160-4.5 MCG/ACT inhaler Inhale 2 puffs into the lungs daily. 11/16/16   Vaughan Basta, MD  calcium acetate (PHOSLO) 667 MG capsule Take 2,001 mg by mouth 3 (three) times daily.    [provider]  doxycycline (VIBRA-TABS) 100 MG tablet Take 1 tablet (100 mg total) by mouth 2 (two) times daily. Patient not taking: Reported on 08/28/2017 07/10/17   Harvest Dark, MD  ferrous sulfate 325 (65 FE) MG tablet Take 1 tablet by mouth 3 (three) times daily. 01/21/17   [provider]  furosemide (LASIX) 80 MG tablet Take 1 tablet (80 mg total) daily by mouth. 01/25/17   Demetrios Loll, MD  gabapentin  (NEURONTIN) 300 MG capsule Take 1 capsule by mouth daily as directed 08/16/16   [provider]  ipratropium-albuterol (DUONEB) 0.5-2.5 (3) MG/3ML SOLN Take 3 mLs by nebulization every 6 (six) hours as needed. 11/28/16   Loletha Grayer, MD  labetalol (NORMODYNE) 100 MG tablet Take 100 mg by mouth daily. 03/24/17   [provider]  Multiple Vitamins-Minerals-FA (DIALYVITE SUPREME D) 3 MG TABS Take 1 tablet (3 mg total) by mouth daily. 11/28/16   Loletha Grayer, MD  nitroGLYCERIN (NITROSTAT) 0.4 MG SL tablet Place 1 tablet (0.4 mg total) under the tongue every 5 (five) minutes as needed. 04/12/16   Wende Bushy, MD  pantoprazole (PROTONIX) 40 MG tablet Take 1 tablet (40 mg total) by mouth daily. 11/28/16   Loletha Grayer, MD  Spacer/Aero Chamber Mouthpiece MISC 1 Units by Does not apply route every 4 (four) hours as needed (wheezing). 02/19/17   Darel Hong, MD  tiotropium (SPIRIVA HANDIHALER) 18 MCG inhalation capsule Place 1 capsule (18  mcg total) into inhaler and inhale daily. 11/16/16 11/16/17  Vaughan Basta, MD    Allergies Patient has no known allergies.    Social History Social History   Tobacco Use  . Smoking status: Current Every Day Smoker    Packs/day: 0.15    Years: 40.00    Pack years: 6.00    Types: Cigarettes  . Smokeless tobacco: Never Used  Substance Use Topics  . Alcohol use: No    Comment: pt reports quitting after learning about cirrhosis  . Drug use: No    Frequency: 7.0 times per week    Types: Marijuana, Cocaine    Review of Systems Patient denies headaches, rhinorrhea, blurry vision, numbness, shortness of breath, chest pain, edema, cough, abdominal pain, nausea, vomiting, diarrhea, dysuria, fevers, rashes or hallucinations unless otherwise stated above in HPI. ____________________________________________   PHYSICAL EXAM:  VITAL SIGNS: Vitals:   09/06/17 1208  BP: 127/77  Pulse: 78  Resp: 12  Temp: 97.9 F (36.6 C)  SpO2:  100%    Constitutional: Alert and oriented.  Eyes: Conjunctivae are normal.  Head: Atraumatic. Nose: No congestion/rhinnorhea. Mouth/Throat: Mucous membranes are moist.   Neck: No stridor. Painless ROM.  Cardiovascular: Normal rate, regular rhythm. Grossly normal heart sounds.  Good peripheral circulation. Respiratory: Normal respiratory effort.  No retractions. Lungs CTAB. Gastrointestinal: Soft and nontender. No distention. No abdominal bruits. No CVA tenderness. Genitourinary:  Musculoskeletal: No lower extremity tenderness nor edema.  No joint effusions. Neurologic:  Normal speech and language. No gross focal neurologic deficits are appreciated. No facial droop Skin:  Skin is warm, dry and intact. No rash noted. Psychiatric: Mood and affect are normal. Speech and behavior are normal.  ____________________________________________   LABS (all labs ordered are listed, but only abnormal results are displayed)  No results found for this or any previous visit (from the past 24 hour(s)). ____________________________________________  ____________________________________________   PROCEDURES  Procedure(s) performed:  Procedures    Critical Care performed: no ____________________________________________   INITIAL IMPRESSION / ASSESSMENT AND PLAN / ED COURSE  Pertinent labs & imaging results that were available during my care of the patient were reviewed by me and considered in my medical decision making (see chart for details).   DDX: esrd on dialysis  Stanley [name] is a 57 y.o. who presents to the ED with end-stage renal disease on dialysis.  Patient here for routine dialysis.  Have consulted nephrology.      As part of my medical decision making, I reviewed the following data within the [place] notes reviewed and incorporated, Labs reviewed, notes from prior ED visits.   ____________________________________________   FINAL CLINICAL  IMPRESSION(S) / ED DIAGNOSES  Final diagnoses:  ESRD on dialysis ([place])      NEW MEDICATIONS STARTED DURING THIS VISIT:  New Prescriptions   No medications on file     Note:  This document was prepared using Dragon voice recognition software and may include unintentional dictation errors.    Merlyn Lot, MD 09/06/17 [phone]

## 2017-09-06 NOTE — Progress Notes (Signed)
HD tx end    09/06/17 1654  Vital Signs  Pulse Rate 95  Pulse Rate Source Monitor  Resp 17  BP 118/83  BP Location Left Arm  BP Method Automatic  Patient Position (if appropriate) Sitting  Oxygen Therapy  SpO2 95 %  O2 Device Room Air  During Hemodialysis Assessment  Dialysis Fluid Bolus Normal Saline  Bolus Amount (mL) 250 mL  Intra-Hemodialysis Comments Tx completed

## 2017-09-06 NOTE — Progress Notes (Signed)
2nd attempt to get pt's medication, must be given during dialysis

## 2017-09-08 ENCOUNTER — Encounter: Payer: Self-pay | Admitting: Emergency Medicine

## 2017-09-08 ENCOUNTER — Emergency Department
Admission: EM | Admit: 2017-09-08 | Discharge: 2017-09-08 | Discharge status: Against Medical Advice | Payer: Medicare Other | Attending: Emergency Medicine | Admitting: Emergency Medicine

## 2017-09-08 ENCOUNTER — Other Ambulatory Visit: Payer: Self-pay

## 2017-09-08 DIAGNOSIS — I120 Hypertensive chronic kidney disease with stage 5 chronic kidney disease or end stage renal disease: Secondary | ICD-10-CM | POA: Diagnosis not present

## 2017-09-08 DIAGNOSIS — N186 End stage renal disease: Secondary | ICD-10-CM | POA: Insufficient documentation

## 2017-09-08 DIAGNOSIS — Z992 Dependence on renal dialysis: Secondary | ICD-10-CM | POA: Insufficient documentation

## 2017-09-08 DIAGNOSIS — E875 Hyperkalemia: Secondary | ICD-10-CM | POA: Diagnosis not present

## 2017-09-08 DIAGNOSIS — D631 Anemia in chronic kidney disease: Secondary | ICD-10-CM | POA: Diagnosis not present

## 2017-09-08 DIAGNOSIS — F17210 Nicotine dependence, cigarettes, uncomplicated: Secondary | ICD-10-CM | POA: Insufficient documentation

## 2017-09-08 DIAGNOSIS — Z79899 Other long term (current) drug therapy: Secondary | ICD-10-CM | POA: Insufficient documentation

## 2017-09-08 DIAGNOSIS — Z4931 Encounter for adequacy testing for hemodialysis: Secondary | ICD-10-CM | POA: Diagnosis not present

## 2017-09-08 DIAGNOSIS — N2581 Secondary hyperparathyroidism of renal origin: Secondary | ICD-10-CM | POA: Diagnosis not present

## 2017-09-08 DIAGNOSIS — J449 Chronic obstructive pulmonary disease, unspecified: Secondary | ICD-10-CM | POA: Diagnosis not present

## 2017-09-08 LAB — RENAL FUNCTION PANEL
Albumin: 3 g/dL — ABNORMAL LOW (ref 3.5–5.0)
Anion gap: 8 (ref 5–15)
BUN: 42 mg/dL — AB (ref 6–20)
CALCIUM: 8.4 mg/dL — AB (ref 8.9–10.3)
CHLORIDE: 101 mmol/L (ref 98–111)
CO2: 25 mmol/L (ref 22–32)
CREATININE: 7.59 mg/dL — AB (ref 0.61–1.24)
GFR calc Af Amer: 8 mL/min — ABNORMAL LOW (ref >60)
GFR calc non Af Amer: 7 mL/min — ABNORMAL LOW (ref >60)
GLUCOSE: 110 mg/dL — AB (ref 70–99)
Phosphorus: 4 mg/dL (ref 2.5–4.6)
Potassium: 6.3 mmol/L (ref 3.5–5.1)
SODIUM: 134 mmol/L — AB (ref 135–145)

## 2017-09-08 LAB — CBC
HCT: 30.6 % — ABNORMAL LOW (ref 40.0–52.0)
Hemoglobin: 9.6 g/dL — ABNORMAL LOW (ref 13.0–18.0)
MCH: 24.8 pg — AB (ref 26.0–34.0)
MCHC: 31.4 g/dL — AB (ref 32.0–36.0)
MCV: 79 fL — AB (ref 80.0–100.0)
PLATELETS: 318 10*3/uL (ref 150–440)
RBC: 3.87 MIL/uL — ABNORMAL LOW (ref 4.40–5.90)
RDW: 20.4 % — AB (ref 11.5–14.5)
WBC: 6.3 10*3/uL (ref 3.8–10.6)

## 2017-09-08 MED ORDER — EPOETIN ALFA 10000 UNIT/ML IJ SOLN
10000.0000 [IU] | Freq: Once | INTRAMUSCULAR | Status: AC
  Administered 2017-09-08: 10000 [IU] via INTRAVENOUS
  Filled 2017-09-08 (×2): qty 1

## 2017-09-08 MED ORDER — CHLORHEXIDINE GLUCONATE CLOTH 2 % EX PADS: 6.0000 | MEDICATED_PAD | Freq: Every day | CUTANEOUS | Status: DC

## 2017-09-08 NOTE — Progress Notes (Signed)
Hd completed 

## 2017-09-08 NOTE — ED Provider Notes (Signed)
[facility] Emergency Department Provider Note   ____________________________________________   First MD Initiated Contact with Patient 09/08/17 1200     (approximate)  I have reviewed the triage vital signs and the nursing notes.   HISTORY  Chief Complaint Here for regular dialysis   HPI Stanley [name] is a 57 y.o. male reports he is here for his regular dialysis, had dialysis 2 days ago and is due again today  Denies any symptoms or concerns.  Reports he thinks he will be ready to do his dialysis soon.  Specifically denies any shortness of breath, no chest pain, no swelling.  Does not think he has had any unusual weight gain.  Denies any concerns at this time.  Been on dialysis for some time now, reports his left arm fistula has been blocked for a while and they are now doing his dialysis through his right chest catheter.  No pain.   Past Medical History:  Diagnosis Date  . Alcohol abuse   . CHF (congestive heart failure) ([place])   . Cirrhosis ([place])   . Coronary artery disease 2009  . Drug abuse ([place])   . End stage renal disease on dialysis [facility]) NEPHROLOGIST-   DR [facility]  IN [place]   HEMODIALYSIS --   TUES/  THURS/  SAT  . Gastrointestinal bleed 06/13/2017   From chart...hx of multiple GI bleeds  . GERD (gastroesophageal reflux disease)   . Hyperlipidemia   . Hypertension   . PAD (peripheral artery disease) ([place])   . Renal insufficiency    Per pt, 32 oz fluid restriction per day  . S/P triple vessel bypass 06/09/2016   2009ish  . Suicidal ideation    & HOMICIDAL IDEATION --  06-16-2013   ADMITTED TO BEHAVIOR HEALTH    Patient Active Problem List   Diagnosis Date Noted  . ESRD (end stage renal disease) on dialysis ([place]) 07/28/2017  . Protein-calorie malnutrition, severe 06/14/2017  . Encounter for dialysis [facility])   . Palliative care by specialist   . Goals of care, counseling/discussion   . Malnutrition of moderate degree  06/05/2017  . Secondary esophageal varices without bleeding ([place])   . Stomach irritation   . Idiopathic esophageal varices without bleeding ([place])   . Alcoholic hepatitis with ascites 05/24/2017  . ESRD (end stage renal disease) ([place]) 04/28/2017  . Uremia 03/08/2017  . ESRD on hemodialysis ([place]) 03/03/2017  . Weakness 02/28/2017  . Hypocalcemia 02/22/2017  . Shortness of breath 11/26/2016  . COPD (chronic obstructive pulmonary disease) ([place]) 10/30/2016  . COPD exacerbation ([place]) 10/29/2016  . Anemia   . Heme positive stool   . Ulceration of intestine   . Benign neoplasm of transverse colon   . Acute gastrointestinal hemorrhage   . Esophageal candidiasis ([place])   . Angiodysplasia of intestinal tract   . Acute respiratory failure with hypoxia ([place]) 07/03/2016  . GI bleeding 06/24/2016  . Rectal bleeding 06/14/2016  . Anemia of chronic disease 06/01/2016  . MRSA carrier 06/01/2016  . Chronic renal failure 05/23/2016  . Ischemic heart disease 05/23/2016  . Angiodysplasia of small intestine   . Melena   . Small bowel bleed not requiring more than 4 units of blood in 24 hours, ICU, or surgery   . Anemia due to chronic blood loss   . Abdominal pain 05/05/2016  . Acute posthemorrhagic anemia 04/17/2016  . Gastrointestinal bleed 04/17/2016  . History of esophagogastroduodenoscopy (EGD) 04/17/2016  . Elevated troponin 04/17/2016  .  Alcohol abuse 04/17/2016  . Upper GI bleed 01/19/2016  . Blood in stool   . Angiodysplasia of stomach and duodenum with hemorrhage   . Gastritis   . Reflux esophagitis   . GI bleed 05/16/2015  . Acute GI bleeding   . Symptomatic anemia 04/30/2015  . HTN (hypertension) 04/06/2015  . GERD (gastroesophageal reflux disease) 04/06/2015  . HLD (hyperlipidemia) 04/06/2015  . Dyspnea 04/06/2015  . Cirrhosis of liver with ascites ([place]) 04/06/2015  . Ascites 04/06/2015  . GIB (gastrointestinal bleeding) 03/23/2015  . Homicidal ideation 06/19/2013  . Suicidal  intent 06/19/2013  . Homicidal ideations 06/19/2013  . Hyperkalemia 06/16/2013  . Mandible fracture ([place]) 06/05/2013  . Fracture, mandible ([place]) 06/02/2013  . Coronary atherosclerosis of native coronary artery 06/02/2013  . ESRD on dialysis ([place]) 06/02/2013  . Mandible open fracture ([place]) 06/02/2013    Past Surgical History:  Procedure Laterality Date  . A/V FISTULAGRAM Right 06/06/2017   Procedure: A/V FISTULAGRAM;  Surgeon: Katha Cabal, MD;  Location: [place] CV LAB;  Service: Cardiovascular;  Laterality: Right;  . A/V SHUNT INTERVENTION N/A 06/06/2017   Procedure: A/V SHUNT INTERVENTION;  Surgeon: Katha Cabal, MD;  Location: [place] CV LAB;  Service: Cardiovascular;  Laterality: N/A;  . AGILE CAPSULE N/A 06/19/2016   Procedure: AGILE CAPSULE;  Surgeon: [name], MD;  Location: [facility] ENDOSCOPY;  Service: Endoscopy;  Laterality: N/A;  . COLONOSCOPY WITH PROPOFOL N/A 06/18/2016   Procedure: COLONOSCOPY WITH PROPOFOL;  Surgeon: [name], MD;  Location: [facility] ENDOSCOPY;  Service: Endoscopy;  Laterality: N/A;  . COLONOSCOPY WITH PROPOFOL N/A 08/12/2016   Procedure: COLONOSCOPY WITH PROPOFOL;  Surgeon: [name], MD;  Location: [facility] ENDOSCOPY;  Service: Endoscopy;  Laterality: N/A;  . COLONOSCOPY WITH PROPOFOL N/A 05/05/2017   Procedure: COLONOSCOPY WITH PROPOFOL;  Surgeon: Manya Silvas, MD;  Location: [facility] ENDOSCOPY;  Service: Endoscopy;  Laterality: N/A;  . CORONARY ANGIOPLASTY  ?   PT UNABLE TO TELL IF  BEFORE OR AFTER  CABG  . CORONARY ARTERY BYPASS GRAFT  2008  ([place] , [place])   3 VESSEL  . DIALYSIS FISTULA CREATION  LAST SURGERY  APPOX  2008  . ENTEROSCOPY N/A 05/10/2016   Procedure: ENTEROSCOPY;  Surgeon: Jerene Bears, MD;  Location: [place];  Service: Gastroenterology;  Laterality: N/A;  . ENTEROSCOPY N/A 08/12/2016   Procedure: ENTEROSCOPY;  Surgeon: [name], MD;  Location: [facility] ENDOSCOPY;  Service: Endoscopy;  Laterality: N/A;  . ENTEROSCOPY  Left 06/03/2017   Procedure: ENTEROSCOPY;  Surgeon: Virgel Manifold, MD;  Location: [facility] ENDOSCOPY;  Service: Endoscopy;  Laterality: Left;  Procedure date will ultimately depend on when patient is medically optimized before the procedure, pending hemodialysis and blood transfusions etc. Will place on schedule and change depending on clinical status.   . ENTEROSCOPY N/A 06/05/2017   Procedure: ENTEROSCOPY;  Surgeon: Virgel Manifold, MD;  Location: [facility] ENDOSCOPY;  Service: Endoscopy;  Laterality: N/A;  . ENTEROSCOPY N/A 06/15/2017   Procedure: Push ENTEROSCOPY;  Surgeon: [name], MD;  Location: [facility] ENDOSCOPY;  Service: Endoscopy;  Laterality: N/A;  . ESOPHAGOGASTRODUODENOSCOPY N/A 05/07/2015   Procedure: ESOPHAGOGASTRODUODENOSCOPY (EGD);  Surgeon: Hulen Luster, MD;  Location: [facility] ENDOSCOPY;  Service: Endoscopy;  Laterality: N/A;  . ESOPHAGOGASTRODUODENOSCOPY (EGD) WITH PROPOFOL N/A 05/17/2015   Procedure: ESOPHAGOGASTRODUODENOSCOPY (EGD) WITH PROPOFOL;  Surgeon: [name], MD;  Location: [facility] ENDOSCOPY;  Service: Endoscopy;  Laterality: N/A;  . ESOPHAGOGASTRODUODENOSCOPY (EGD) WITH PROPOFOL N/A 01/20/2016   Procedure: ESOPHAGOGASTRODUODENOSCOPY (EGD) WITH  PROPOFOL;  Surgeon: [name], MD;  Location: [facility] ENDOSCOPY;  Service: Endoscopy;  Laterality: N/A;  . ESOPHAGOGASTRODUODENOSCOPY (EGD) WITH PROPOFOL N/A 04/17/2016   Procedure: ESOPHAGOGASTRODUODENOSCOPY (EGD) WITH PROPOFOL;  Surgeon: Lin Landsman, MD;  Location: [facility] ENDOSCOPY;  Service: Gastroenterology;  Laterality: N/A;  . ESOPHAGOGASTRODUODENOSCOPY (EGD) WITH PROPOFOL  05/09/2016   Procedure: ESOPHAGOGASTRODUODENOSCOPY (EGD) WITH PROPOFOL;  Surgeon: Jerene Bears, MD;  Location: [place];  Service: Endoscopy;;  . ESOPHAGOGASTRODUODENOSCOPY (EGD) WITH PROPOFOL N/A 06/16/2016   Procedure: ESOPHAGOGASTRODUODENOSCOPY (EGD) WITH PROPOFOL;  Surgeon: [name], MD;  Location: [facility] ENDOSCOPY;  Service: Endoscopy;  Laterality: N/A;  .  ESOPHAGOGASTRODUODENOSCOPY (EGD) WITH PROPOFOL N/A 05/05/2017   Procedure: ESOPHAGOGASTRODUODENOSCOPY (EGD) WITH PROPOFOL;  Surgeon: Manya Silvas, MD;  Location: [facility] ENDOSCOPY;  Service: Endoscopy;  Laterality: N/A;  . ESOPHAGOGASTRODUODENOSCOPY (EGD) WITH PROPOFOL N/A 06/15/2017   Procedure: ESOPHAGOGASTRODUODENOSCOPY (EGD) WITH PROPOFOL;  Surgeon: [name], MD;  Location: [facility] ENDOSCOPY;  Service: Endoscopy;  Laterality: N/A;  . GIVENS CAPSULE STUDY N/A 05/07/2016   Procedure: GIVENS CAPSULE STUDY;  Surgeon: Doran Stabler, MD;  Location: [place];  Service: Endoscopy;  Laterality: N/A;  . MANDIBULAR HARDWARE REMOVAL N/A 07/29/2013   Procedure: REMOVAL OF ARCH BARS;  Surgeon: Theodoro Kos, DO;  Location: [place];  Service: Plastics;  Laterality: N/A;  . ORIF MANDIBULAR FRACTURE N/A 06/05/2013   Procedure: REPAIR OF MANDIBULAR FRACTURE x 2 with maxillo-mandibular fixation ;  Surgeon: Theodoro Kos, DO;  Location: [place];  Service: Plastics;  Laterality: N/A;  . PERIPHERAL ARTERIAL STENT GRAFT Left     Prior to Admission medications   Medication Sig Start Date End Date Taking? Authorizing Provider  albuterol (PROVENTIL HFA;VENTOLIN HFA) 108 (90 Base) MCG/ACT inhaler Inhale 2 puffs into the lungs every 6 (six) hours as needed for wheezing or shortness of breath. 04/21/17   Lisa Roca, MD  alum & mag hydroxide-simeth (MAALOX/MYLANTA) 200-200-20 MG/5ML suspension Take 15 mLs every 4 (four) hours as needed by mouth for indigestion or heartburn. Patient not taking: Reported on 04/19/2017 01/25/17   Demetrios Loll, MD  atorvastatin (LIPITOR) 40 MG tablet Take 1 tablet (40 mg total) by mouth daily. 11/16/16   Vaughan Basta, MD  budesonide-formoterol (SYMBICORT) 160-4.5 MCG/ACT inhaler Inhale 2 puffs into the lungs daily. 11/16/16   Vaughan Basta, MD  calcium acetate (PHOSLO) 667 MG capsule Take 2,001 mg by mouth 3 (three) times daily.    [provider]    doxycycline (VIBRA-TABS) 100 MG tablet Take 1 tablet (100 mg total) by mouth 2 (two) times daily. Patient not taking: Reported on 08/28/2017 07/10/17   Harvest Dark, MD  ferrous sulfate 325 (65 FE) MG tablet Take 1 tablet by mouth 3 (three) times daily. 01/21/17   [provider]  furosemide (LASIX) 80 MG tablet Take 1 tablet (80 mg total) daily by mouth. 01/25/17   Demetrios Loll, MD  gabapentin (NEURONTIN) 300 MG capsule Take 1 capsule by mouth daily as directed 08/16/16   [provider]  ipratropium-albuterol (DUONEB) 0.5-2.5 (3) MG/3ML SOLN Take 3 mLs by nebulization every 6 (six) hours as needed. 11/28/16   Loletha Grayer, MD  labetalol (NORMODYNE) 100 MG tablet Take 100 mg by mouth daily. 03/24/17   [provider]  Multiple Vitamins-Minerals-FA (DIALYVITE SUPREME D) 3 MG TABS Take 1 tablet (3 mg total) by mouth daily. 11/28/16   Loletha Grayer, MD  nitroGLYCERIN (NITROSTAT) 0.4 MG SL tablet Place 1 tablet (0.4 mg total) under the tongue every  5 (five) minutes as needed. 04/12/16   Wende Bushy, MD  pantoprazole (PROTONIX) 40 MG tablet Take 1 tablet (40 mg total) by mouth daily. 11/28/16   Loletha Grayer, MD  Spacer/Aero Chamber Mouthpiece MISC 1 Units by Does not apply route every 4 (four) hours as needed (wheezing). 02/19/17   Darel Hong, MD  tiotropium (SPIRIVA HANDIHALER) 18 MCG inhalation capsule Place 1 capsule (18 mcg total) into inhaler and inhale daily. 11/16/16 11/16/17  Vaughan Basta, MD    Allergies Patient has no known allergies.  Family History  Problem Relation Age of Onset  . Colon cancer Mother   . Cancer Father   . Cancer Sister   . Kidney disease Brother     Social History Social History   Tobacco Use  . Smoking status: Current Every Day Smoker    Packs/day: 0.15    Years: 40.00    Pack years: 6.00    Types: Cigarettes  . Smokeless tobacco: Never Used  Substance Use Topics  . Alcohol use: No    Comment: pt reports  quitting after learning about cirrhosis  . Drug use: No    Frequency: 7.0 times per week    Types: Marijuana, Cocaine    Review of Systems Constitutional: No fever/chills or weakness Cardiovascular: Denies chest pain. Respiratory: Denies shortness of breath. Gastrointestinal: No abdominal pain.   Skin: Negative for rash. Neurological: Negative for headaches or weakness.  ____________________________________________   PHYSICAL EXAM:  VITAL SIGNS: ED Triage Vitals [09/08/17 1139]  Enc Vitals Group     BP 128/85     Pulse Rate 86     Resp 18     Temp 98 F (36.7 C)     Temp Source Oral     SpO2 98 %     Weight 155 lb (70.3 kg)     Height 6\' 3"  (1.905 m)     Head Circumference      Peak Flow      Pain Score 0     Pain Loc      Pain Edu?      Excl. in [place]?     Constitutional: Alert and oriented. Well appearing and in no acute distress.  Pleasant. Eyes: Conjunctivae are normal. Head: Atraumatic. Mouth/Throat: Mucous membranes are moist. Neck: No stridor.   Cardiovascular: Normal rate, regular rhythm. Grossly normal heart sounds.  Good peripheral circulation.  Right upper chest subclavian catheter clean dry intact. Respiratory: Normal respiratory effort.  No retractions. Lungs CTAB. Gastrointestinal: Soft and nontender.  Musculoskeletal: No lower extremity tenderness nor edema. Neurologic:  Normal speech and language. No gross focal neurologic deficits are appreciated.  Skin:  Skin is warm, dry and intact. No rash noted. Psychiatric: Mood and affect are normal.  ____________________________________________   LABS (all labs ordered are listed, but only abnormal results are displayed)  Labs Reviewed  RENAL FUNCTION PANEL - Abnormal; Notable for the following components:      Result Value   Sodium 134 (*)    Potassium 6.3 (*)    Glucose, Bld 110 (*)    BUN 42 (*)    Creatinine, Ser 7.59 (*)    Calcium 8.4 (*)    Albumin 3.0 (*)    GFR calc non Af Amer 7 (*)     GFR calc Af Amer 8 (*)    All other components within normal limits  CBC - Abnormal; Notable for the following components:   RBC 3.87 (*)    Hemoglobin 9.6 (*)  HCT 30.6 (*)    MCV 79.0 (*)    MCH 24.8 (*)    MCHC 31.4 (*)    RDW 20.4 (*)    All other components within normal limits   ____________________________________________  EKG   ____________________________________________  RADIOLOGY   ____________________________________________   PROCEDURES  Procedure(s) performed: None  Procedures  Critical Care performed: No  ____________________________________________   INITIAL IMPRESSION / ASSESSMENT AND PLAN / ED COURSE  Pertinent labs & imaging results that were available during my care of the patient were reviewed by me and considered in my medical decision making (see chart for details).  Patient is asymptomatic.  Stable hemodynamics.  Here for his outpatient dialysis.  Clinical Course as of Sep 08 1704  Fri Sep 08, 2017  1203 Case and care discussed with Dr. Juleen China.  He will place orders and plans to have hemodialysis performed for the patient.  No additional orders or blood work recommended at this time.   [MQ]    Clinical Course User Index [MQ] Delman Kitten, MD   ----------------------------------------- 5:06 PM on 09/08/2017 -----------------------------------------  Patient in dialysis suite.  Ongoing care assigned Dr. Burlene Arnt, follow-up on patient assessment post hemodialysis.  ____________________________________________   FINAL CLINICAL IMPRESSION(S) / ED DIAGNOSES  Final diagnoses:  ESRD (end stage renal disease) ([place])      NEW MEDICATIONS STARTED DURING THIS VISIT:  New Prescriptions   No medications on file     Note:  This document was prepared using Dragon voice recognition software and may include unintentional dictation errors.     Delman Kitten, MD 09/08/17 [id number]

## 2017-09-08 NOTE — ED Triage Notes (Signed)
Here for is regular dialysis

## 2017-09-08 NOTE — Progress Notes (Signed)
[facility]  ROUNDING NOTE   Subjective:   Seen and examined on hemodialysis. Tolerating treatment well. 2K bath  Objective:  Vital signs in last 24 hours:  Temp:  [98 F (36.7 C)-98.4 F (36.9 C)] 98.4 F (36.9 C) (06/28 1345) Pulse Rate:  [82-86] 82 (06/28 1515) Resp:  [15-20] 17 (06/28 1515) BP: (125-152)/(71-103) 129/71 (06/28 1515) SpO2:  [98 %-100 %] 100 % (06/28 1345) Weight:  [70.3 kg (155 lb)] 70.3 kg (155 lb) (06/28 1139)  Weight change:  Filed Weights   09/08/17 1139  Weight: 70.3 kg (155 lb)    Intake/Output: No intake/output data recorded.   Intake/Output this shift:  No intake/output data recorded.  Physical Exam: General: No acute distress  Head: Normocephalic, atraumatic. Moist oral mucosal membranes  Eyes: Anicteric  Neck: Supple, trachea midline  Lungs:  Clear to auscultation, normal effort  Heart: S1S2 no rubs  Abdomen:  Soft, nontender, bowel sounds present, distension notede  Extremities: No peripheral edema.  Neurologic: Awake, alert, following commands  Skin: No lesions  Access: R IJ permcath    Basic Metabolic Panel: Recent Labs  Lab 09/06/17 1634 09/08/17 1402  NA 136 134*  K 5.5* 6.3*  CL 97* 101  CO2 27 25  GLUCOSE 108* 110*  BUN 45* 42*  CREATININE 8.82* 7.59*  CALCIUM 8.4* 8.4*  PHOS 5.0* 4.0    Liver Function Tests: Recent Labs  Lab 09/06/17 1634 09/08/17 1402  ALBUMIN 3.1* 3.0*   No results for input(s): LIPASE, AMYLASE in the last 168 hours. No results for input(s): AMMONIA in the last 168 hours.  CBC: Recent Labs  Lab 09/06/17 1634 09/08/17 1402  WBC 5.8 6.3  HGB 10.0* 9.6*  HCT 32.9* 30.6*  MCV 78.9* 79.0*  PLT 338 318    Cardiac Enzymes: No results for input(s): CKTOTAL, CKMB, CKMBINDEX, TROPONINI in the last 168 hours.  BNP: Invalid input(s): POCBNP  CBG: No results for input(s): GLUCAP in the last 168 hours.  Microbiology: Results for orders placed or performed during the  hospital encounter of 07/12/17  Culture, blood (Routine X 2) w Reflex to ID Panel     Status: None   Collection Time: 07/12/17  4:43 PM  Result Value Ref Range Status   Specimen Description BLOOD LINE CENTRAL  Final   Special Requests   Final    BOTTLES DRAWN AEROBIC AND ANAEROBIC Blood Culture adequate volume   Culture   Final    NO GROWTH 5 DAYS Performed at [facility], [address] [place], [place] [address]    Report Status 07/17/2017 FINAL  Final    Coagulation Studies: No results for input(s): LABPROT, INR in the last 72 hours.  Urinalysis: No results for input(s): COLORURINE, LABSPEC, PHURINE, GLUCOSEU, HGBUR, BILIRUBINUR, KETONESUR, PROTEINUR, UROBILINOGEN, NITRITE, LEUKOCYTESUR in the last 72 hours.  Invalid input(s): APPERANCEUR    Imaging: No results found.   Medications:    . [START ON 09/09/2017] Chlorhexidine Gluconate Cloth  6 each Topical Q0600  . epoetin (EPOGEN/PROCRIT) injection  10,000 Units Intravenous Once     Assessment/ Plan:  57 y.o. black male withend stage renal disease on hemodialysis secondary to Alport's syndrome, ascites, hypertension, anemia of chronic kidney disease, coronary artery disease, peripheral vascular disease, hyperlipidemia, gastrointestinal AVMs, pulmonary hypertension  nooutpatientdialysis unit currently  1. Hyperkalemia: pending today's labs.  - 2K bath on hemodialysis - dialyvite refilled.   2.  End-stage renal disease:   Tolerating dialysis treatment  3. Ascites: status  post paracentesis on August 23, 2017  4.  Anemia of CKD:    - EPO with HD treatment 10000 units.   5.  Secondary hyperparathyroidism:  - Continue binders  6. Dialysis access: Vein mapping completing.   7. GERD:  - refill omeprazole  8. COPD - refilled spiriva.    LOS: 0 Quisha Mabie 6/28/20193:30 PM

## 2017-09-08 NOTE — Progress Notes (Signed)
Hd started  

## 2017-09-11 ENCOUNTER — Non-Acute Institutional Stay
Admission: EM | Admit: 2017-09-11 | Discharge: 2017-09-11 | Discharge status: Against Medical Advice | Payer: Medicare Other | Attending: Emergency Medicine | Admitting: Emergency Medicine

## 2017-09-11 ENCOUNTER — Encounter: Payer: Self-pay | Admitting: Emergency Medicine

## 2017-09-11 DIAGNOSIS — D631 Anemia in chronic kidney disease: Secondary | ICD-10-CM | POA: Insufficient documentation

## 2017-09-11 DIAGNOSIS — N186 End stage renal disease: Secondary | ICD-10-CM | POA: Insufficient documentation

## 2017-09-11 DIAGNOSIS — Z992 Dependence on renal dialysis: Secondary | ICD-10-CM | POA: Insufficient documentation

## 2017-09-11 DIAGNOSIS — K2970 Gastritis, unspecified, without bleeding: Secondary | ICD-10-CM | POA: Diagnosis not present

## 2017-09-11 DIAGNOSIS — Z86010 Personal history of colonic polyps: Secondary | ICD-10-CM | POA: Insufficient documentation

## 2017-09-11 DIAGNOSIS — I739 Peripheral vascular disease, unspecified: Secondary | ICD-10-CM | POA: Diagnosis not present

## 2017-09-11 DIAGNOSIS — R748 Abnormal levels of other serum enzymes: Secondary | ICD-10-CM | POA: Diagnosis not present

## 2017-09-11 DIAGNOSIS — I8510 Secondary esophageal varices without bleeding: Secondary | ICD-10-CM | POA: Diagnosis not present

## 2017-09-11 DIAGNOSIS — K7031 Alcoholic cirrhosis of liver with ascites: Secondary | ICD-10-CM | POA: Insufficient documentation

## 2017-09-11 DIAGNOSIS — I2510 Atherosclerotic heart disease of native coronary artery without angina pectoris: Secondary | ICD-10-CM | POA: Diagnosis not present

## 2017-09-11 DIAGNOSIS — E785 Hyperlipidemia, unspecified: Secondary | ICD-10-CM | POA: Diagnosis not present

## 2017-09-11 DIAGNOSIS — I120 Hypertensive chronic kidney disease with stage 5 chronic kidney disease or end stage renal disease: Secondary | ICD-10-CM | POA: Diagnosis not present

## 2017-09-11 DIAGNOSIS — E875 Hyperkalemia: Secondary | ICD-10-CM | POA: Diagnosis not present

## 2017-09-11 DIAGNOSIS — R188 Other ascites: Secondary | ICD-10-CM | POA: Diagnosis not present

## 2017-09-11 DIAGNOSIS — K31811 Angiodysplasia of stomach and duodenum with bleeding: Secondary | ICD-10-CM | POA: Insufficient documentation

## 2017-09-11 DIAGNOSIS — F17210 Nicotine dependence, cigarettes, uncomplicated: Secondary | ICD-10-CM | POA: Diagnosis not present

## 2017-09-11 DIAGNOSIS — I509 Heart failure, unspecified: Secondary | ICD-10-CM | POA: Insufficient documentation

## 2017-09-11 DIAGNOSIS — I132 Hypertensive heart and chronic kidney disease with heart failure and with stage 5 chronic kidney disease, or end stage renal disease: Secondary | ICD-10-CM | POA: Insufficient documentation

## 2017-09-11 DIAGNOSIS — I272 Pulmonary hypertension, unspecified: Secondary | ICD-10-CM | POA: Insufficient documentation

## 2017-09-11 DIAGNOSIS — K210 Gastro-esophageal reflux disease with esophagitis: Secondary | ICD-10-CM | POA: Insufficient documentation

## 2017-09-11 DIAGNOSIS — N2581 Secondary hyperparathyroidism of renal origin: Secondary | ICD-10-CM | POA: Diagnosis not present

## 2017-09-11 DIAGNOSIS — J449 Chronic obstructive pulmonary disease, unspecified: Secondary | ICD-10-CM | POA: Insufficient documentation

## 2017-09-11 DIAGNOSIS — E43 Unspecified severe protein-calorie malnutrition: Secondary | ICD-10-CM | POA: Insufficient documentation

## 2017-09-11 LAB — PHOSPHORUS: Phosphorus: 6.1 mg/dL — ABNORMAL HIGH (ref 2.5–4.6)

## 2017-09-11 MED ORDER — HEPARIN SODIUM (PORCINE) 1000 UNIT/ML DIALYSIS: 50.0000 [IU]/kg | INTRAMUSCULAR | Status: DC | PRN

## 2017-09-11 MED ORDER — EPOETIN ALFA 4000 UNIT/ML IJ SOLN
4000.0000 [IU] | Freq: Once | INTRAMUSCULAR | Status: AC
  Administered 2017-09-11: 4000 [IU] via INTRAVENOUS
  Filled 2017-09-11: qty 1

## 2017-09-11 MED ORDER — DIPHENHYDRAMINE HCL 50 MG/ML IJ SOLN
25.0000 mg | Freq: Once | INTRAMUSCULAR | Status: AC
  Administered 2017-09-11: 25 mg via INTRAVENOUS

## 2017-09-11 MED ORDER — CHLORHEXIDINE GLUCONATE CLOTH 2 % EX PADS: 6.0000 | MEDICATED_PAD | Freq: Every day | CUTANEOUS | Status: DC

## 2017-09-11 NOTE — Progress Notes (Signed)
HD tx start    09/11/17 1430  Vital Signs  Pulse Rate 86  Pulse Rate Source Monitor  Resp 20  BP (!) 135/92  BP Location Left Arm  BP Method Automatic  Patient Position (if appropriate) Lying  Oxygen Therapy  SpO2 99 %  O2 Device Room Air  During Hemodialysis Assessment  Blood Flow Rate (mL/min) 400 mL/min  Arterial Pressure (mmHg) -150 mmHg  Venous Pressure (mmHg) 130 mmHg  Transmembrane Pressure (mmHg) 70 mmHg  Ultrafiltration Rate (mL/min) 1140 mL/min  Dialysate Flow Rate (mL/min) 800 ml/min  Conductivity: Machine  13.9  HD Safety Checks Performed Yes  Dialysis Fluid Bolus Normal Saline  Bolus Amount (mL) 250 mL  Intra-Hemodialysis Comments Tx initiated  Hemodialysis Catheter Right Internal jugular Double-lumen  No Placement Date or Time found.   Placed prior to admission: Yes  Orientation: Right  Access Location: Internal jugular  Hemodialysis Catheter Type: Double-lumen  Blue Lumen Status Infusing  Red Lumen Status Infusing

## 2017-09-11 NOTE — Progress Notes (Signed)
[facility]  ROUNDING NOTE   Subjective:   Seen and examined on hemodialysis. Tolerating treatment well. 2K bath. Potassium level elevated Denies acute SOB Abdomen is distended   Objective:  Vital signs in last 24 hours:  Temp:  [98.4 F (36.9 C)-98.7 F (37.1 C)] 98.4 F (36.9 C) (07/01 1426) Pulse Rate:  [80-98] 81 (07/01 1545) Resp:  [17-22] 19 (07/01 1545) BP: (120-152)/(63-92) 125/72 (07/01 1545) SpO2:  [97 %-100 %] 97 % (07/01 1545) Weight:  [71 kg (156 lb 8.4 oz)-71.7 kg (158 lb)] 71 kg (156 lb 8.4 oz) (07/01 1426)  Weight change:  Filed Weights   09/11/17 1121 09/11/17 1426  Weight: 71.7 kg (158 lb) 71 kg (156 lb 8.4 oz)    Intake/Output: No intake/output data recorded.   Intake/Output this shift:  No intake/output data recorded.  Physical Exam: General: No acute distress  Head: Normocephalic, atraumatic. Moist oral mucosal membranes  Eyes: Anicteric  Neck: Supple, trachea midline  Lungs:  Clear to auscultation, normal effort  Heart: S1S2 no rubs  Abdomen:  Soft, nontender,  distension noted  Extremities: ++ peripheral edema.  Neurologic: Awake, alert, following commands  Skin: No lesions  Access: R IJ permcath    Basic Metabolic Panel: Recent Labs  Lab 09/06/17 1634 09/08/17 1402 09/11/17 1452  NA 136 134*  --   K 5.5* 6.3*  --   CL 97* 101  --   CO2 27 25  --   GLUCOSE 108* 110*  --   BUN 45* 42*  --   CREATININE 8.82* 7.59*  --   CALCIUM 8.4* 8.4*  --   PHOS 5.0* 4.0 6.1*    Liver Function Tests: Recent Labs  Lab 09/06/17 1634 09/08/17 1402  ALBUMIN 3.1* 3.0*   No results for input(s): LIPASE, AMYLASE in the last 168 hours. No results for input(s): AMMONIA in the last 168 hours.  CBC: Recent Labs  Lab 09/06/17 1634 09/08/17 1402  WBC 5.8 6.3  HGB 10.0* 9.6*  HCT 32.9* 30.6*  MCV 78.9* 79.0*  PLT 338 318    Cardiac Enzymes: No results for input(s): CKTOTAL, CKMB, CKMBINDEX, TROPONINI in the last 168  hours.  BNP: Invalid input(s): POCBNP  CBG: No results for input(s): GLUCAP in the last 168 hours.  Microbiology: Results for orders placed or performed during the hospital encounter of 07/12/17  Culture, blood (Routine X 2) w Reflex to ID Panel     Status: None   Collection Time: 07/12/17  4:43 PM  Result Value Ref Range Status   Specimen Description BLOOD LINE CENTRAL  Final   Special Requests   Final    BOTTLES DRAWN AEROBIC AND ANAEROBIC Blood Culture adequate volume   Culture   Final    NO GROWTH 5 DAYS Performed at [facility], [address] [place], [place] [address]    Report Status 07/17/2017 FINAL  Final    Coagulation Studies: No results for input(s): LABPROT, INR in the last 72 hours.  Urinalysis: No results for input(s): COLORURINE, LABSPEC, PHURINE, GLUCOSEU, HGBUR, BILIRUBINUR, KETONESUR, PROTEINUR, UROBILINOGEN, NITRITE, LEUKOCYTESUR in the last 72 hours.  Invalid input(s): APPERANCEUR    Imaging: No results found.   Medications:    . [START ON 09/12/2017] Chlorhexidine Gluconate Cloth  6 each Topical Q0600     Assessment/ Plan:  57 y.o. black Stanley [name] withend stage renal disease on hemodialysis secondary to Alport's syndrome, ascites, hypertension, anemia of chronic kidney disease, coronary artery disease, peripheral vascular disease,  hyperlipidemia, gastrointestinal AVMs, pulmonary hypertension  nooutpatientdialysis unit currently  1. Hyperkalemia:   - 2K bath on hemodialysis - advised on risk of high potassium and recommended to follow low K diet Patient aware of foods to avoid  2.  End-stage renal disease:    Tolerating dialysis treatment  3. Ascites:  status post paracentesis on August 23, 2017  4.  Anemia of CKD:    - EPO with HD treatment    5.  Secondary hyperparathyroidism:  - low K diet  6. Dialysis access:  Vein mapping F/u with vascular surgery for further eval for surgery     LOS: 0 Erik Burkett 7/1/20193:50 PM

## 2017-09-11 NOTE — ED Triage Notes (Signed)
Patient presents to ED via POV reporting he needs dialysis treatment. When asked if he is working on getting treatment though a dialysis center he reports "They say I need a stent for that and they are working on that". Patient denies any symptoms at this time.

## 2017-09-11 NOTE — Progress Notes (Signed)
Pre HD assessment   09/11/17 1426  Vital Signs  Temp 98.4 F (36.9 C)  Temp Source Oral  Pulse Rate 86  Pulse Rate Source Monitor  Resp 17  BP (!) 148/92  BP Location Left Arm  BP Method Automatic  Patient Position (if appropriate) Lying  Oxygen Therapy  SpO2 99 %  O2 Device Room Air  Pain Assessment  Pain Scale 0-10  Pain Score 0  Dialysis Weight  Weight 71 kg (156 lb 8.4 oz)  Type of Weight Pre-Dialysis  Time-Out for Hemodialysis  What Procedure? HD  Pt Identifiers(min of two) First/Last Name;MRN/Account#  Correct Site? Yes  Correct Side? Yes  Correct Procedure? Yes  Consents Verified? Yes  Rad Studies Available? N/A  Safety Precautions Reviewed? Yes  Engineer, civil (consulting) Number  ([id number])  Station Number 3  UF/Alarm Test Passed  Conductivity: Meter 13.8  Conductivity: Machine  13.9  pH 7.4  Reverse Osmosis main  Normal Saline Lot Number [id number]  Dialyzer Lot Number [id number]  Disposable Set Lot Number [id number]  Machine Temperature 98.6 F (37 C)  Musician and Audible Yes  Blood Lines Intact and Secured Yes  Pre Treatment Patient Checks  Vascular access used during treatment Catheter  Hepatitis B Surface Antigen Results Negative  Date Hepatitis B Surface Antigen Drawn 06/16/17  Hepatitis B Surface Antibody  (>10)  Date Hepatitis B Surface Antibody Drawn 06/16/17  Hemodialysis Consent Verified Yes  Hemodialysis Standing Orders Initiated Yes  ECG (Telemetry) Monitor On Yes  Prime Ordered Normal Saline  Length of  DialysisTreatment -hour(s) 3.5 Hour(s)  Dialyzer Elisio 17H NR  Dialysate 2K, 2.5 Ca  Dialysis Anticoagulant None  Dialysate Flow Ordered 800  Blood Flow Rate Ordered 400 mL/min  Ultrafiltration Goal 4 Liters  Pre Treatment Labs Phosphorus  Dialysis Blood Pressure Support Ordered Normal Saline  Education / Care Plan  Dialysis Education Provided Yes  Documented Education in Care Plan Yes  Hemodialysis Catheter Right Internal jugular  Double-lumen  No Placement Date or Time found.   Placed prior to admission: Yes  Orientation: Right  Access Location: Internal jugular  Hemodialysis Catheter Type: Double-lumen  Site Condition No complications  Blue Lumen Status Heparin locked  Red Lumen Status Heparin locked  Purple Lumen Status N/A  Dressing Type Biopatch  Dressing Status Clean;Dry;Intact  Drainage Description None  Dressing Change Due 09/13/17

## 2017-09-11 NOTE — ED Notes (Addendum)
Pt here for dialysis treatment, normally M,W,F. Last treatment was last Friday and he came here for it. Pt reports "they haven't got me set up with where I need to go yet." Pt resting on stretcher and eating his lunch. Pt denies any complaints. Call bell within reach.

## 2017-09-11 NOTE — Progress Notes (Signed)
HD tx end   09/11/17 1703  Vital Signs  Pulse Rate 82  Pulse Rate Source Monitor  Resp (!) 21  BP 122/80  BP Location Left Arm  BP Method Automatic  Patient Position (if appropriate) Lying  Oxygen Therapy  SpO2 100 %  O2 Device Room Air  During Hemodialysis Assessment  Dialysis Fluid Bolus Normal Saline  Bolus Amount (mL) 250 mL  Intra-Hemodialysis Comments Tx completed

## 2017-09-11 NOTE — ED Notes (Signed)
Pt to be taken to dialysis by Noal Abshier [facility], tech.

## 2017-09-11 NOTE — Progress Notes (Signed)
Pre HD assessment    09/11/17 1427  Neurological  Level of Consciousness Alert  Orientation Level Oriented X4  Respiratory  Respiratory Pattern Regular;Unlabored  Chest Assessment Chest expansion symmetrical  Cough Non-productive  Cardiac  ECG Monitor Yes  Vascular  R Radial Pulse +2  L Radial Pulse +2  Edema Generalized  Integumentary  Integumentary (WDL) X  Skin Color Appropriate for ethnicity  Skin Condition Itching  Musculoskeletal  Musculoskeletal (WDL) X  Generalized Weakness Yes  Assistive Device None  GU Assessment  Genitourinary (WDL) X  Genitourinary Symptoms  (HD)  Psychosocial  Psychosocial (WDL) WDL

## 2017-09-11 NOTE — Progress Notes (Signed)
Post HD assessment    09/11/17 1710  Neurological  Level of Consciousness Alert  Orientation Level Oriented X4  Respiratory  Respiratory Pattern Regular;Unlabored  Chest Assessment Chest expansion symmetrical  Cough Non-productive  Cardiac  ECG Monitor Yes  Vascular  R Radial Pulse +2  L Radial Pulse +2  Edema Generalized  Integumentary  Integumentary (WDL) X  Skin Color Appropriate for ethnicity  Skin Condition Itching  Musculoskeletal  Musculoskeletal (WDL) X  Generalized Weakness Yes  Assistive Device None  GU Assessment  Genitourinary (WDL) X  Genitourinary Symptoms  (HD)  Psychosocial  Psychosocial (WDL) WDL

## 2017-09-11 NOTE — Progress Notes (Signed)
Post HD assessment. Pt tolerated tx well without c/o or complication. Pt requested to be taken off early r/t having a ride home. 1 hr of tx remaining. Net UF 2371, goal not met.    09/11/17 1711  Vital Signs  Temp 98.7 F (37.1 C)  Temp Source Oral  Pulse Rate 86  Pulse Rate Source Monitor  Resp (!) 22  BP 134/74  BP Location Left Arm  BP Method Automatic  Patient Position (if appropriate) Lying  Oxygen Therapy  SpO2 99 %  O2 Device Room Air  Dialysis Weight  Weight 69.1 kg (152 lb 5.4 oz)  Type of Weight Post-Dialysis  Post-Hemodialysis Assessment  Rinseback Volume (mL) 250 mL  KECN 54.8 V  Dialyzer Clearance Lightly streaked  Duration of HD Treatment -hour(s) 2.5 hour(s)  Hemodialysis Intake (mL) 500 mL  UF Total -Machine (mL) 2871 mL  Net UF (mL) 2371 mL  Tolerated HD Treatment Yes  Education / Care Plan  Dialysis Education Provided Yes  Documented Education in Care Plan Yes  Hemodialysis Catheter Right Internal jugular Double-lumen  No Placement Date or Time found.   Placed prior to admission: Yes  Orientation: Right  Access Location: Internal jugular  Hemodialysis Catheter Type: Double-lumen  Site Condition No complications  Blue Lumen Status Heparin locked  Red Lumen Status Heparin locked  Purple Lumen Status N/A  Catheter fill solution Heparin 1000 units/ml  Catheter fill volume (Arterial) 1.5 cc  Catheter fill volume (Venous) 1.5  Dressing Type Biopatch  Dressing Status Clean;Dry;Intact  Drainage Description None  Dressing Change Due 09/13/17  Post treatment catheter status Capped and Clamped

## 2017-09-11 NOTE — ED Notes (Signed)
Spoke to dialysis RN Melissa and pt to go have treatment in about 30 minutes. Pt updated with plan of care. Pt denies any complaints at this time.

## 2017-09-11 NOTE — Progress Notes (Signed)
Pt walked out of dialysis unit, he stated that he did not want to wait on his ED transportation escort.    09/11/17 1724  Hand-Off documentation  Report received from (Full Name) Stark Bray

## 2017-09-11 NOTE — ED Provider Notes (Signed)
[facility] Emergency Department Provider Note       Time seen: ----------------------------------------- 12:12 PM on 09/11/2017 -----------------------------------------   I have reviewed the triage vital signs and the nursing notes.  HISTORY   Chief Complaint Dialysis Treatment    HPI Stanley [name] is a 57 y.o. male with a history of alcohol abuse, CHF, cirrhosis, drug abuse and end-stage renal disease on dialysis who presents to the ED for dialysis.  Patient is normally on Monday, Wednesday and Friday dialysis.  His last treatment was on Friday.  He denies any complaints at this time and is here for his regularly scheduled dialysis.  Past Medical History:  Diagnosis Date  . Alcohol abuse   . CHF (congestive heart failure) ([place])   . Cirrhosis ([place])   . Coronary artery disease 2009  . Drug abuse ([place])   . End stage renal disease on dialysis [facility]) NEPHROLOGIST-   [facility]  IN [place]   HEMODIALYSIS --   TUES/  THURS/  SAT  . Gastrointestinal bleed 06/13/2017   From chart...hx of multiple GI bleeds  . GERD (gastroesophageal reflux disease)   . Hyperlipidemia   . Hypertension   . PAD (peripheral artery disease) ([place])   . Renal insufficiency    Per pt, 32 oz fluid restriction per day  . S/P triple vessel bypass 06/09/2016   2009ish  . Suicidal ideation    & HOMICIDAL IDEATION --  06-16-2013   ADMITTED TO BEHAVIOR HEALTH    Patient Active Problem List   Diagnosis Date Noted  . ESRD (end stage renal disease) on dialysis ([place]) 07/28/2017  . Protein-calorie malnutrition, severe 06/14/2017  . Encounter for dialysis [facility])   . Palliative care by specialist   . Goals of care, counseling/discussion   . Malnutrition of moderate degree 06/05/2017  . Secondary esophageal varices without bleeding ([place])   . Stomach irritation   . Idiopathic esophageal varices without bleeding ([place])   . Alcoholic hepatitis with ascites 05/24/2017  . ESRD (end  stage renal disease) ([place]) 04/28/2017  . Uremia 03/08/2017  . ESRD on hemodialysis ([place]) 03/03/2017  . Weakness 02/28/2017  . Hypocalcemia 02/22/2017  . Shortness of breath 11/26/2016  . COPD (chronic obstructive pulmonary disease) ([place]) 10/30/2016  . COPD exacerbation ([place]) 10/29/2016  . Anemia   . Heme positive stool   . Ulceration of intestine   . Benign neoplasm of transverse colon   . Acute gastrointestinal hemorrhage   . Esophageal candidiasis ([place])   . Angiodysplasia of intestinal tract   . Acute respiratory failure with hypoxia ([place]) 07/03/2016  . GI bleeding 06/24/2016  . Rectal bleeding 06/14/2016  . Anemia of chronic disease 06/01/2016  . MRSA carrier 06/01/2016  . Chronic renal failure 05/23/2016  . Ischemic heart disease 05/23/2016  . Angiodysplasia of small intestine   . Melena   . Small bowel bleed not requiring more than 4 units of blood in 24 hours, ICU, or surgery   . Anemia due to chronic blood loss   . Abdominal pain 05/05/2016  . Acute posthemorrhagic anemia 04/17/2016  . Gastrointestinal bleed 04/17/2016  . History of esophagogastroduodenoscopy (EGD) 04/17/2016  . Elevated troponin 04/17/2016  . Alcohol abuse 04/17/2016  . Upper GI bleed 01/19/2016  . Blood in stool   . Angiodysplasia of stomach and duodenum with hemorrhage   . Gastritis   . Reflux esophagitis   . GI bleed 05/16/2015  . Acute GI bleeding   . Symptomatic anemia 04/30/2015  .  HTN (hypertension) 04/06/2015  . GERD (gastroesophageal reflux disease) 04/06/2015  . HLD (hyperlipidemia) 04/06/2015  . Dyspnea 04/06/2015  . Cirrhosis of liver with ascites ([place]) 04/06/2015  . Ascites 04/06/2015  . GIB (gastrointestinal bleeding) 03/23/2015  . Homicidal ideation 06/19/2013  . Suicidal intent 06/19/2013  . Homicidal ideations 06/19/2013  . Hyperkalemia 06/16/2013  . Mandible fracture ([place]) 06/05/2013  . Fracture, mandible ([place]) 06/02/2013  . Coronary atherosclerosis of native coronary  artery 06/02/2013  . ESRD on dialysis ([place]) 06/02/2013  . Mandible open fracture ([place]) 06/02/2013    Past Surgical History:  Procedure Laterality Date  . A/V FISTULAGRAM Right 06/06/2017   Procedure: A/V FISTULAGRAM;  Surgeon: Katha Cabal, MD;  Location: [place] CV [facility];  Service: Cardiovascular;  Laterality: Right;  . A/V SHUNT INTERVENTION N/A 06/06/2017   Procedure: A/V SHUNT INTERVENTION;  Surgeon: Katha Cabal, MD;  Location: [place] CV LAB;  Service: Cardiovascular;  Laterality: N/A;  . AGILE CAPSULE N/A 06/19/2016   Procedure: AGILE CAPSULE;  Surgeon: [name], MD;  Location: [facility] ENDOSCOPY;  Service: Endoscopy;  Laterality: N/A;  . COLONOSCOPY WITH PROPOFOL N/A 06/18/2016   Procedure: COLONOSCOPY WITH PROPOFOL;  Surgeon: [name], MD;  Location: [facility] ENDOSCOPY;  Service: Endoscopy;  Laterality: N/A;  . COLONOSCOPY WITH PROPOFOL N/A 08/12/2016   Procedure: COLONOSCOPY WITH PROPOFOL;  Surgeon: [name], MD;  Location[facility] ENDOSCOPY;  Service: Endoscopy;  Laterality: N/A;  . COLONOSCOPY WITH PROPOFOL N/A 05/05/2017   Procedure: COLONOSCOPY WITH PROPOFOL;  Surgeon: Manya Silvas, MD;  Location: [facility] ENDOSCOPY;  Service: Endoscopy;  Laterality: N/A;  . CORONARY ANGIOPLASTY  ?   PT UNABLE TO TELL IF  BEFORE OR AFTER  CABG  . CORONARY ARTERY BYPASS GRAFT  2008  ([place])   3 VESSEL  . DIALYSIS FISTULA CREATION  LAST SURGERY  APPOX  2008  . ENTEROSCOPY N/A 05/10/2016   Procedure: ENTEROSCOPY;  Surgeon: Jerene Bears, MD;  Location: [place];  Service: Gastroenterology;  Laterality: N/A;  . ENTEROSCOPY N/A 08/12/2016   Procedure: ENTEROSCOPY;  Surgeon: [name], MD;  Location: [facility] ENDOSCOPY;  Service: Endoscopy;  Laterality: N/A;  . ENTEROSCOPY Left 06/03/2017   Procedure: ENTEROSCOPY;  Surgeon: Virgel Manifold, MD;  Location: [facility] ENDOSCOPY;  Service: Endoscopy;  Laterality: Left;  Procedure date will ultimately depend on when patient is  medically optimized before the procedure, pending hemodialysis and blood transfusions etc. Will place on schedule and change depending on clinical status.   . ENTEROSCOPY N/A 06/05/2017   Procedure: ENTEROSCOPY;  Surgeon: Virgel Manifold, MD;  Location: [facility] ENDOSCOPY;  Service: Endoscopy;  Laterality: N/A;  . ENTEROSCOPY N/A 06/15/2017   Procedure: Push ENTEROSCOPY;  Surgeon: [name], MD;  Location: [facility] ENDOSCOPY;  Service: Endoscopy;  Laterality: N/A;  . ESOPHAGOGASTRODUODENOSCOPY N/A 05/07/2015   Procedure: ESOPHAGOGASTRODUODENOSCOPY (EGD);  Surgeon: Hulen Luster, MD;  Location: [facility] ENDOSCOPY;  Service: Endoscopy;  Laterality: N/A;  . ESOPHAGOGASTRODUODENOSCOPY (EGD) WITH PROPOFOL N/A 05/17/2015   Procedure: ESOPHAGOGASTRODUODENOSCOPY (EGD) WITH PROPOFOL;  Surgeon: [name], MD;  Location: [facility] ENDOSCOPY;  Service: Endoscopy;  Laterality: N/A;  . ESOPHAGOGASTRODUODENOSCOPY (EGD) WITH PROPOFOL N/A 01/20/2016   Procedure: ESOPHAGOGASTRODUODENOSCOPY (EGD) WITH PROPOFOL;  Surgeon: [name], MD;  Location: [facility] ENDOSCOPY;  Service: Endoscopy;  Laterality: N/A;  . ESOPHAGOGASTRODUODENOSCOPY (EGD) WITH PROPOFOL N/A 04/17/2016   Procedure: ESOPHAGOGASTRODUODENOSCOPY (EGD) WITH PROPOFOL;  Surgeon: Lin Landsman, MD;  Location: [facility] ENDOSCOPY;  Service: Gastroenterology;  Laterality: N/A;  . ESOPHAGOGASTRODUODENOSCOPY (EGD) WITH  PROPOFOL  05/09/2016   Procedure: ESOPHAGOGASTRODUODENOSCOPY (EGD) WITH PROPOFOL;  Surgeon: Jerene Bears, MD;  Location: [place];  Service: Endoscopy;;  . ESOPHAGOGASTRODUODENOSCOPY (EGD) WITH PROPOFOL N/A 06/16/2016   Procedure: ESOPHAGOGASTRODUODENOSCOPY (EGD) WITH PROPOFOL;  Surgeon: [name], MD;  Location: [facility] ENDOSCOPY;  Service: Endoscopy;  Laterality: N/A;  . ESOPHAGOGASTRODUODENOSCOPY (EGD) WITH PROPOFOL N/A 05/05/2017   Procedure: ESOPHAGOGASTRODUODENOSCOPY (EGD) WITH PROPOFOL;  Surgeon: Manya Silvas, MD;  Location: [facility] ENDOSCOPY;  Service:  Endoscopy;  Laterality: N/A;  . ESOPHAGOGASTRODUODENOSCOPY (EGD) WITH PROPOFOL N/A 06/15/2017   Procedure: ESOPHAGOGASTRODUODENOSCOPY (EGD) WITH PROPOFOL;  Surgeon: [name], MD;  Location: [facility] ENDOSCOPY;  Service: Endoscopy;  Laterality: N/A;  . GIVENS CAPSULE STUDY N/A 05/07/2016   Procedure: GIVENS CAPSULE STUDY;  Surgeon: Doran Stabler, MD;  Location: [place];  Service: Endoscopy;  Laterality: N/A;  . MANDIBULAR HARDWARE REMOVAL N/A 07/29/2013   Procedure: REMOVAL OF ARCH BARS;  Surgeon: Theodoro Kos, DO;  Location: [place];  Service: Plastics;  Laterality: N/A;  . ORIF MANDIBULAR FRACTURE N/A 06/05/2013   Procedure: REPAIR OF MANDIBULAR FRACTURE x 2 with maxillo-mandibular fixation ;  Surgeon: Theodoro Kos, DO;  Location: [place];  Service: Plastics;  Laterality: N/A;  . PERIPHERAL ARTERIAL STENT GRAFT Left     Allergies Patient has no known allergies.  Social History Social History   Tobacco Use  . Smoking status: Current Every Day Smoker    Packs/day: 0.15    Years: 40.00    Pack years: 6.00    Types: Cigarettes  . Smokeless tobacco: Never Used  Substance Use Topics  . Alcohol use: No    Comment: pt reports quitting after learning about cirrhosis  . Drug use: No    Frequency: 7.0 times per week    Types: Marijuana, Cocaine   Review of Systems Constitutional: Negative for fever. Cardiovascular: Negative for chest pain. Respiratory: Negative for shortness of breath. Gastrointestinal: Negative for abdominal pain, vomiting and diarrhea. Musculoskeletal: Negative for back pain. Skin: Negative for rash. Neurological: Negative for headaches, focal weakness or numbness.  All systems negative/normal/unremarkable except as stated in the HPI  ____________________________________________   PHYSICAL EXAM:  VITAL SIGNS: ED Triage Vitals  Enc Vitals Group     BP 09/11/17 1120 128/79     Pulse Rate 09/11/17 1120 98     Resp --      Temp  09/11/17 1120 98.7 F (37.1 C)     Temp Source 09/11/17 1120 Oral     SpO2 09/11/17 1120 100 %     Weight 09/11/17 1121 158 lb (71.7 kg)     Height 09/11/17 1121 6\' 3"  (1.905 m)     Head Circumference --      Peak Flow --      Pain Score --      Pain Loc --      Pain Edu? --      Excl. in [place]? --    Constitutional: Alert and oriented.  No distress Eyes: Conjunctivae are normal. Normal extraocular movements. Cardiovascular: Normal rate, regular rhythm. No murmurs, rubs, or gallops. Respiratory: Normal respiratory effort without tachypnea nor retractions. Breath sounds are clear and equal bilaterally. No wheezes/rales/rhonchi. Gastrointestinal: Soft and nontender. Normal bowel sounds Musculoskeletal: Nontender with normal range of motion in extremities. No lower extremity tenderness nor edema. Neurologic:  Normal speech and language. No gross focal neurologic deficits are appreciated.  Skin:  Skin is warm, dry and intact. No rash noted. Psychiatric: Mood and  affect are normal. Speech and behavior are normal.  ____________________________________________  ED COURSE:  As part of my medical decision making, I reviewed the following data within the [place] History obtained from family if available, nursing notes, old chart and ekg, as well as notes from prior ED visits. Patient presented for dialysis, he appears medically cleared at this time for same.   Procedures  ____________________________________________  DIFFERENTIAL DIAGNOSIS   End-stage renal disease on dialysis, electrolyte abnormality  FINAL ASSESSMENT AND PLAN  End-stage renal disease on dialysis   Plan: The patient had presented for weekly dialysis treatment.  He denies any complaints at this time and appears medically cleared for dialysis.   Laurence Aly, MD   Note: This note was generated in part or whole with voice recognition software. Voice recognition is usually quite accurate  but there are transcription errors that can and very often do occur. I apologize for any typographical errors that were not detected and corrected.     Earleen Newport, MD 09/11/17 [id number]

## 2017-09-15 ENCOUNTER — Encounter: Payer: Self-pay | Admitting: Emergency Medicine

## 2017-09-15 ENCOUNTER — Emergency Department
Admission: EM | Admit: 2017-09-15 | Discharge: 2017-09-15 | Disposition: A | Discharge status: Home / Self Care | Payer: Medicare Other | Attending: Emergency Medicine | Admitting: Emergency Medicine

## 2017-09-15 DIAGNOSIS — I132 Hypertensive heart and chronic kidney disease with heart failure and with stage 5 chronic kidney disease, or end stage renal disease: Secondary | ICD-10-CM | POA: Insufficient documentation

## 2017-09-15 DIAGNOSIS — I509 Heart failure, unspecified: Secondary | ICD-10-CM | POA: Diagnosis not present

## 2017-09-15 DIAGNOSIS — N186 End stage renal disease: Secondary | ICD-10-CM | POA: Insufficient documentation

## 2017-09-15 DIAGNOSIS — E875 Hyperkalemia: Secondary | ICD-10-CM | POA: Diagnosis not present

## 2017-09-15 DIAGNOSIS — N2581 Secondary hyperparathyroidism of renal origin: Secondary | ICD-10-CM | POA: Diagnosis not present

## 2017-09-15 DIAGNOSIS — I120 Hypertensive chronic kidney disease with stage 5 chronic kidney disease or end stage renal disease: Secondary | ICD-10-CM | POA: Diagnosis not present

## 2017-09-15 DIAGNOSIS — R188 Other ascites: Secondary | ICD-10-CM | POA: Diagnosis not present

## 2017-09-15 DIAGNOSIS — Z79899 Other long term (current) drug therapy: Secondary | ICD-10-CM | POA: Insufficient documentation

## 2017-09-15 DIAGNOSIS — J449 Chronic obstructive pulmonary disease, unspecified: Secondary | ICD-10-CM | POA: Diagnosis not present

## 2017-09-15 DIAGNOSIS — Z992 Dependence on renal dialysis: Secondary | ICD-10-CM | POA: Diagnosis not present

## 2017-09-15 DIAGNOSIS — I2510 Atherosclerotic heart disease of native coronary artery without angina pectoris: Secondary | ICD-10-CM | POA: Insufficient documentation

## 2017-09-15 DIAGNOSIS — F17210 Nicotine dependence, cigarettes, uncomplicated: Secondary | ICD-10-CM | POA: Diagnosis not present

## 2017-09-15 DIAGNOSIS — D631 Anemia in chronic kidney disease: Secondary | ICD-10-CM | POA: Insufficient documentation

## 2017-09-15 LAB — RENAL FUNCTION PANEL
ALBUMIN: 3.1 g/dL — AB (ref 3.5–5.0)
Anion gap: 15 (ref 5–15)
BUN: 52 mg/dL — AB (ref 6–20)
CO2: 22 mmol/L (ref 22–32)
CREATININE: 9.11 mg/dL — AB (ref 0.61–1.24)
Calcium: 8.4 mg/dL — ABNORMAL LOW (ref 8.9–10.3)
Chloride: 102 mmol/L (ref 98–111)
GFR calc Af Amer: 6 mL/min — ABNORMAL LOW (ref >60)
GFR, EST NON AFRICAN AMERICAN: 6 mL/min — AB (ref >60)
Glucose, Bld: 128 mg/dL — ABNORMAL HIGH (ref 70–99)
PHOSPHORUS: 5.7 mg/dL — AB (ref 2.5–4.6)
Potassium: 6.3 mmol/L (ref 3.5–5.1)
Sodium: 139 mmol/L (ref 135–145)

## 2017-09-15 LAB — CBC
HCT: 33.4 % — ABNORMAL LOW (ref 40.0–52.0)
Hemoglobin: 10.1 g/dL — ABNORMAL LOW (ref 13.0–18.0)
MCH: 23.8 pg — ABNORMAL LOW (ref 26.0–34.0)
MCHC: 30.4 g/dL — ABNORMAL LOW (ref 32.0–36.0)
MCV: 78.3 fL — ABNORMAL LOW (ref 80.0–100.0)
PLATELETS: 298 10*3/uL (ref 150–440)
RBC: 4.26 MIL/uL — ABNORMAL LOW (ref 4.40–5.90)
RDW: 20.2 % — AB (ref 11.5–14.5)
WBC: 7.2 10*3/uL (ref 3.8–10.6)

## 2017-09-15 LAB — PHOSPHORUS: Phosphorus: 7 mg/dL — ABNORMAL HIGH (ref 2.5–4.6)

## 2017-09-15 MED ORDER — EPOETIN ALFA 4000 UNIT/ML IJ SOLN
4000.0000 [IU] | Freq: Once | INTRAMUSCULAR | Status: AC
  Administered 2017-09-15: 4000 [IU] via INTRAVENOUS
  Filled 2017-09-15 (×3): qty 1

## 2017-09-15 MED ORDER — SODIUM CHLORIDE 0.9 % IV SOLN: 100.0000 mL | INTRAVENOUS | Status: DC | PRN

## 2017-09-15 MED ORDER — ALTEPLASE 2 MG IJ SOLR
2.0000 mg | Freq: Once | INTRAMUSCULAR | Status: DC | PRN
  Filled 2017-09-15: qty 2

## 2017-09-15 MED ORDER — CHLORHEXIDINE GLUCONATE CLOTH 2 % EX PADS: 6.0000 | MEDICATED_PAD | Freq: Every day | CUTANEOUS | Status: DC

## 2017-09-15 MED ORDER — DIPHENHYDRAMINE HCL 50 MG/ML IJ SOLN
12.5000 mg | Freq: Once | INTRAMUSCULAR | Status: AC
  Administered 2017-09-15: 12.5 mg via INTRAVENOUS

## 2017-09-15 NOTE — ED Triage Notes (Signed)
Patient presents to the ED for dialysis treatment.  Patient states he is feeling somewhat weak today.  Patient comes to the ED regularly for dialysis treatment.

## 2017-09-15 NOTE — Progress Notes (Signed)
Hd started  

## 2017-09-15 NOTE — Progress Notes (Signed)
[facility]  ROUNDING NOTE   Subjective:   Patient presents for SOB and dialysis. Labs pending   HEMODIALYSIS FLOWSHEET:  Blood Flow Rate (mL/min): 400 mL/min Arterial Pressure (mmHg): -180 mmHg Venous Pressure (mmHg): 210 mmHg Transmembrane Pressure (mmHg): 70 mmHg Ultrafiltration Rate (mL/min): 1010 mL/min Dialysate Flow Rate (mL/min): 800 ml/min Conductivity: Machine : 13.5 Conductivity: Machine : 13.5 Dialysis Fluid Bolus: Normal Saline Bolus Amount (mL): 250 mL     Objective:  Vital signs in last 24 hours:  Temp:  [98.3 F (36.8 C)-98.6 F (37 C)] 98.3 F (36.8 C) (07/05 1240) Pulse Rate:  [81-91] 88 (07/05 1321) Resp:  [12-21] 12 (07/05 1321) BP: (147-154)/(83-105) 154/94 (07/05 1321) SpO2:  [97 %-100 %] 100 % (07/05 1240) Weight:  [70.3 kg (155 lb)] 70.3 kg (155 lb) (07/05 1159)  Weight change:  Filed Weights   09/15/17 1159  Weight: 70.3 kg (155 lb)    Intake/Output: No intake/output data recorded.   Intake/Output this shift:  No intake/output data recorded.  Physical Exam: General: No acute distress  Head: Normocephalic, atraumatic. Moist oral mucosal membranes  Eyes: Anicteric  Neck: Supple, trachea midline  Lungs:  Clear to auscultation, normal effort  Heart: S1S2 no rubs  Abdomen:  Soft, nontender,  distended  Extremities: + peripheral edema.  Neurologic:  resting quietly  Skin: No lesions  Access: R IJ permcath    Basic Metabolic Panel: Recent Labs  Lab 09/08/17 1402 09/11/17 1452  NA 134*  --   K 6.3*  --   CL 101  --   CO2 25  --   GLUCOSE 110*  --   BUN 42*  --   CREATININE 7.59*  --   CALCIUM 8.4*  --   PHOS 4.0 6.1*    Liver Function Tests: Recent Labs  Lab 09/08/17 1402  ALBUMIN 3.0*   No results for input(s): LIPASE, AMYLASE in the last 168 hours. No results for input(s): AMMONIA in the last 168 hours.  CBC: Recent Labs  Lab 09/08/17 1402  WBC 6.3  HGB 9.6*  HCT 30.6*  MCV 79.0*  PLT 318     Cardiac Enzymes: No results for input(s): CKTOTAL, CKMB, CKMBINDEX, TROPONINI in the last 168 hours.  BNP: Invalid input(s): POCBNP  CBG: No results for input(s): GLUCAP in the last 168 hours.  Microbiology: Results for orders placed or performed during the hospital encounter of 07/12/17  Culture, blood (Routine X 2) w Reflex to ID Panel     Status: None   Collection Time: 07/12/17  4:43 PM  Result Value Ref Range Status   Specimen Description BLOOD LINE CENTRAL  Final   Special Requests   Final    BOTTLES DRAWN AEROBIC AND ANAEROBIC Blood Culture adequate volume   Culture   Final    NO GROWTH 5 DAYS Performed at [facility], [address] [place], [place] [address]    Report Status 07/17/2017 FINAL  Final    Coagulation Studies: No results for input(s): LABPROT, INR in the last 72 hours.  Urinalysis: No results for input(s): COLORURINE, LABSPEC, PHURINE, GLUCOSEU, HGBUR, BILIRUBINUR, KETONESUR, PROTEINUR, UROBILINOGEN, NITRITE, LEUKOCYTESUR in the last 72 hours.  Invalid input(s): APPERANCEUR    Imaging: No results found.   Medications:   . sodium chloride    . sodium chloride     . [START ON 09/16/2017] Chlorhexidine Gluconate Cloth  6 each Topical Q0600     Assessment/ Plan:  56 y.o. black male withend stage renal  disease on hemodialysis secondary to Alport's syndrome, ascites, hypertension, anemia of chronic kidney disease, coronary artery disease, peripheral vascular disease, hyperlipidemia, gastrointestinal AVMs, pulmonary hypertension  nooutpatientdialysis unit currently  1. Hyperkalemia:   - 2K bath on hemodialysis - Previously advised on risk of high potassium and recommended to follow low K diet - Today's labs are pending  2.  End-stage renal disease:    Tolerating dialysis treatment  3. Ascites:  status post paracentesis on August 23, 2017  4.  Anemia of CKD:    - EPO with HD treatment    5.  Secondary  hyperparathyroidism:  - low phos diet  6. Dialysis access:  Vein mapping F/u with vascular surgery in office for further eval for surgery     LOS: 0 Stanley [name] 7/5/20191:28 PM

## 2017-09-15 NOTE — ED Notes (Signed)
Pt here for dialysis. Pt denies any complaints or needs at this time. Blanket given to pt. Will continue to monitor in hallway bed until pt departs for dialysis.

## 2017-09-15 NOTE — ED Notes (Signed)
Pt reevaluated by Dr. Jimmye Norman.

## 2017-09-15 NOTE — ED Notes (Signed)
Pt refused physical discharge paperwork.  Pt states "just let me sign that thing, I don't want that paper, I got thousands of them at home to just throw away."  Pt encouraged to stay to let nurse go over discharge instructions but patient walked out.

## 2017-09-15 NOTE — Progress Notes (Signed)
Hd Completed

## 2017-09-15 NOTE — Progress Notes (Signed)
Pre dialysis assessment 

## 2017-09-15 NOTE — Progress Notes (Signed)
Post dialysis assessment 

## 2017-09-15 NOTE — ED Provider Notes (Signed)
[facility] Emergency Department Provider Note       Time seen: ----------------------------------------- 4:54 PM on 09/15/2017 -----------------------------------------   I have reviewed the triage vital signs and the nursing notes.  HISTORY   Chief Complaint Vascular Access Problem    HPI Stanley [name] is a 57 y.o. male with a history of alcohol abuse, CHF, cirrhosis, coronary artery disease, drug abuse, end-stage renal disease, hyperlipidemia, hypertension, renal insufficiency who presents to the ED for evaluation after dialysis.  Patient denies any complaints at this time, states dialysis went well.  He denies any shortness of breath.  Past Medical History:  Diagnosis Date  . Alcohol abuse   . CHF (congestive heart failure) ([place])   . Cirrhosis ([place])   . Coronary artery disease 2009  . Drug abuse ([place])   . End stage renal disease on dialysis [facility]) NEPHROLOGIST-   [facility]  IN [place]   HEMODIALYSIS --   TUES/  THURS/  SAT  . Gastrointestinal bleed 06/13/2017   From chart...hx of multiple GI bleeds  . GERD (gastroesophageal reflux disease)   . Hyperlipidemia   . Hypertension   . PAD (peripheral artery disease) ([place])   . Renal insufficiency    Per pt, 32 oz fluid restriction per day  . S/P triple vessel bypass 06/09/2016   2009ish  . Suicidal ideation    & HOMICIDAL IDEATION --  06-16-2013   ADMITTED TO BEHAVIOR HEALTH    Patient Active Problem List   Diagnosis Date Noted  . ESRD (end stage renal disease) on dialysis ([place]) 07/28/2017  . Protein-calorie malnutrition, severe 06/14/2017  . Encounter for dialysis [facility])   . Palliative care by specialist   . Goals of care, counseling/discussion   . Malnutrition of moderate degree 06/05/2017  . Secondary esophageal varices without bleeding ([place])   . Stomach irritation   . Idiopathic esophageal varices without bleeding ([place])   . Alcoholic hepatitis with ascites 05/24/2017  . ESRD (end  stage renal disease) ([place]) 04/28/2017  . Uremia 03/08/2017  . ESRD on hemodialysis ([place]) 03/03/2017  . Weakness 02/28/2017  . Hypocalcemia 02/22/2017  . Shortness of breath 11/26/2016  . COPD (chronic obstructive pulmonary disease) ([place]) 10/30/2016  . COPD exacerbation ([place]) 10/29/2016  . Anemia   . Heme positive stool   . Ulceration of intestine   . Benign neoplasm of transverse colon   . Acute gastrointestinal hemorrhage   . Esophageal candidiasis ([place])   . Angiodysplasia of intestinal tract   . Acute respiratory failure with hypoxia ([place]) 07/03/2016  . GI bleeding 06/24/2016  . Rectal bleeding 06/14/2016  . Anemia of chronic disease 06/01/2016  . MRSA carrier 06/01/2016  . Chronic renal failure 05/23/2016  . Ischemic heart disease 05/23/2016  . Angiodysplasia of small intestine   . Melena   . Small bowel bleed not requiring more than 4 units of blood in 24 hours, ICU, or surgery   . Anemia due to chronic blood loss   . Abdominal pain 05/05/2016  . Acute posthemorrhagic anemia 04/17/2016  . Gastrointestinal bleed 04/17/2016  . History of esophagogastroduodenoscopy (EGD) 04/17/2016  . Elevated troponin 04/17/2016  . Alcohol abuse 04/17/2016  . Upper GI bleed 01/19/2016  . Blood in stool   . Angiodysplasia of stomach and duodenum with hemorrhage   . Gastritis   . Reflux esophagitis   . GI bleed 05/16/2015  . Acute GI bleeding   . Symptomatic anemia 04/30/2015  . HTN (hypertension) 04/06/2015  . GERD (  gastroesophageal reflux disease) 04/06/2015  . HLD (hyperlipidemia) 04/06/2015  . Dyspnea 04/06/2015  . Cirrhosis of liver with ascites ([place]) 04/06/2015  . Ascites 04/06/2015  . GIB (gastrointestinal bleeding) 03/23/2015  . Homicidal ideation 06/19/2013  . Suicidal intent 06/19/2013  . Homicidal ideations 06/19/2013  . Hyperkalemia 06/16/2013  . Mandible fracture ([place]) 06/05/2013  . Fracture, mandible ([place]) 06/02/2013  . Coronary atherosclerosis of native coronary  artery 06/02/2013  . ESRD on dialysis ([place]) 06/02/2013  . Mandible open fracture ([place]) 06/02/2013    Past Surgical History:  Procedure Laterality Date  . A/V FISTULAGRAM Right 06/06/2017   Procedure: A/V FISTULAGRAM;  Surgeon: Katha Cabal, MD;  Location: [place] [facility];  Service: Cardiovascular;  Laterality: Right;  . A/V SHUNT INTERVENTION N/A 06/06/2017   Procedure: A/V SHUNT INTERVENTION;  Surgeon: Katha Cabal, MD;  Location: [place] [facility];  Service: Cardiovascular;  Laterality: N/A;  . AGILE CAPSULE N/A 06/19/2016   Procedure: AGILE CAPSULE;  Surgeon: [name], MD;  Location: [facility] ENDOSCOPY;  Service: Endoscopy;  Laterality: N/A;  . COLONOSCOPY WITH PROPOFOL N/A 06/18/2016   Procedure: COLONOSCOPY WITH PROPOFOL;  Surgeon: [name], MD;  Location: [facility] ENDOSCOPY;  Service: Endoscopy;  Laterality: N/A;  . COLONOSCOPY WITH PROPOFOL N/A 08/12/2016   Procedure: COLONOSCOPY WITH PROPOFOL;  Surgeon: [name], MD;  Location: [facility] ENDOSCOPY;  Service: Endoscopy;  Laterality: N/A;  . COLONOSCOPY WITH PROPOFOL N/A 05/05/2017   Procedure: COLONOSCOPY WITH PROPOFOL;  Surgeon: Manya Silvas, MD;  Location: [facility] ENDOSCOPY;  Service: Endoscopy;  Laterality: N/A;  . CORONARY ANGIOPLASTY  ?   PT UNABLE TO TELL IF  BEFORE OR AFTER  CABG  . CORONARY ARTERY BYPASS GRAFT  2008  ([place] , [place])   3 VESSEL  . DIALYSIS FISTULA CREATION  LAST SURGERY  APPOX  2008  . ENTEROSCOPY N/A 05/10/2016   Procedure: ENTEROSCOPY;  Surgeon: Jerene Bears, MD;  Location: [place];  Service: Gastroenterology;  Laterality: N/A;  . ENTEROSCOPY N/A 08/12/2016   Procedure: ENTEROSCOPY;  Surgeon: [name], MD;  Location: [facility] ENDOSCOPY;  Service: Endoscopy;  Laterality: N/A;  . ENTEROSCOPY Left 06/03/2017   Procedure: ENTEROSCOPY;  Surgeon: Virgel Manifold, MD;  Location: [facility] ENDOSCOPY;  Service: Endoscopy;  Laterality: Left;  Procedure date will ultimately depend on when patient is  medically optimized before the procedure, pending hemodialysis and blood transfusions etc. Will place on schedule and change depending on clinical status.   . ENTEROSCOPY N/A 06/05/2017   Procedure: ENTEROSCOPY;  Surgeon: Virgel Manifold, MD;  Location: [facility] ENDOSCOPY;  Service: Endoscopy;  Laterality: N/A;  . ENTEROSCOPY N/A 06/15/2017   Procedure: Push ENTEROSCOPY;  Surgeon: [name], MD;  Location: [facility] ENDOSCOPY;  Service: Endoscopy;  Laterality: N/A;  . ESOPHAGOGASTRODUODENOSCOPY N/A 05/07/2015   Procedure: ESOPHAGOGASTRODUODENOSCOPY (EGD);  Surgeon: Hulen Luster, MD;  Location: [facility] ENDOSCOPY;  Service: Endoscopy;  Laterality: N/A;  . ESOPHAGOGASTRODUODENOSCOPY (EGD) WITH PROPOFOL N/A 05/17/2015   Procedure: ESOPHAGOGASTRODUODENOSCOPY (EGD) WITH PROPOFOL;  Surgeon: [name], MD;  Location: [facility] ENDOSCOPY;  Service: Endoscopy;  Laterality: N/A;  . ESOPHAGOGASTRODUODENOSCOPY (EGD) WITH PROPOFOL N/A 01/20/2016   Procedure: ESOPHAGOGASTRODUODENOSCOPY (EGD) WITH PROPOFOL;  Surgeon: [name], MD;  Location: [facility] ENDOSCOPY;  Service: Endoscopy;  Laterality: N/A;  . ESOPHAGOGASTRODUODENOSCOPY (EGD) WITH PROPOFOL N/A 04/17/2016   Procedure: ESOPHAGOGASTRODUODENOSCOPY (EGD) WITH PROPOFOL;  Surgeon: Lin Landsman, MD;  Location: [facility] ENDOSCOPY;  Service: Gastroenterology;  Laterality: N/A;  . ESOPHAGOGASTRODUODENOSCOPY (EGD) WITH PROPOFOL  05/09/2016   Procedure:  ESOPHAGOGASTRODUODENOSCOPY (EGD) WITH PROPOFOL;  Surgeon: Jerene Bears, MD;  Location: [place];  Service: Endoscopy;;  . ESOPHAGOGASTRODUODENOSCOPY (EGD) WITH PROPOFOL N/A 06/16/2016   Procedure: ESOPHAGOGASTRODUODENOSCOPY (EGD) WITH PROPOFOL;  Surgeon: [name], MD;  Location: [facility] ENDOSCOPY;  Service: Endoscopy;  Laterality: N/A;  . ESOPHAGOGASTRODUODENOSCOPY (EGD) WITH PROPOFOL N/A 05/05/2017   Procedure: ESOPHAGOGASTRODUODENOSCOPY (EGD) WITH PROPOFOL;  Surgeon: Manya Silvas, MD;  Location: [facility] ENDOSCOPY;  Service:  Endoscopy;  Laterality: N/A;  . ESOPHAGOGASTRODUODENOSCOPY (EGD) WITH PROPOFOL N/A 06/15/2017   Procedure: ESOPHAGOGASTRODUODENOSCOPY (EGD) WITH PROPOFOL;  Surgeon: [name], MD;  Location: [facility] ENDOSCOPY;  Service: Endoscopy;  Laterality: N/A;  . GIVENS CAPSULE STUDY N/A 05/07/2016   Procedure: GIVENS CAPSULE STUDY;  Surgeon: Doran Stabler, MD;  Location: [place];  Service: Endoscopy;  Laterality: N/A;  . MANDIBULAR HARDWARE REMOVAL N/A 07/29/2013   Procedure: REMOVAL OF ARCH BARS;  Surgeon: Theodoro Kos, DO;  Location: [place];  Service: Plastics;  Laterality: N/A;  . ORIF MANDIBULAR FRACTURE N/A 06/05/2013   Procedure: REPAIR OF MANDIBULAR FRACTURE x 2 with maxillo-mandibular fixation ;  Surgeon: Theodoro Kos, DO;  Location: [place];  Service: Plastics;  Laterality: N/A;  . PERIPHERAL ARTERIAL STENT GRAFT Left     Allergies Patient has no known allergies.  Social History Social History   Tobacco Use  . Smoking status: Current Every Day Smoker    Packs/day: 0.15    Years: 40.00    Pack years: 6.00    Types: Cigarettes  . Smokeless tobacco: Never Used  Substance Use Topics  . Alcohol use: No    Comment: pt reports quitting after learning about cirrhosis  . Drug use: No    Frequency: 7.0 times per week    Types: Marijuana, Cocaine   Review of Systems Constitutional: Negative for fever. Cardiovascular: Negative for chest pain. Respiratory: Negative for shortness of breath. Gastrointestinal: Negative for abdominal pain, vomiting and diarrhea. Musculoskeletal: Negative for back pain. Skin: Negative for rash. Neurological: Negative for headaches, focal weakness or numbness.  All systems negative/normal/unremarkable except as stated in the HPI  ____________________________________________   PHYSICAL EXAM:  VITAL SIGNS: ED Triage Vitals  Enc Vitals Group     BP 09/15/17 1158 (!) 147/83     Pulse Rate 09/15/17 1158 88     Resp 09/15/17 1158 18      Temp 09/15/17 1158 98.6 F (37 C)     Temp Source 09/15/17 1158 Oral     SpO2 09/15/17 1158 97 %     Weight 09/15/17 1159 155 lb (70.3 kg)     Height 09/15/17 1159 6\' 3"  (1.905 m)     Head Circumference --      Peak Flow --      Pain Score 09/15/17 1159 0     Pain Loc --      Pain Edu? --      Excl. in [place]? --    Constitutional: Alert and oriented. Well appearing and in no distress. Eyes: Conjunctivae are normal. Normal extraocular movements. Cardiovascular: Normal rate, regular rhythm. No murmurs, rubs, or gallops. Respiratory: Normal respiratory effort without tachypnea nor retractions. Breath sounds are clear and equal bilaterally. No wheezes/rales/rhonchi. Gastrointestinal: Soft and nontender. Normal bowel sounds Musculoskeletal: Nontender with normal range of motion in extremities. No lower extremity tenderness nor edema. Neurologic:  Normal speech and language. No gross focal neurologic deficits are appreciated.  Skin:  Skin is warm, dry and intact. No rash noted. Psychiatric: Mood and  affect are normal. Speech and behavior are normal.  ____________________________________________  ED COURSE:  As part of my medical decision making, I reviewed the following data within the [place] History obtained from family if available, nursing notes, old chart and ekg, as well as notes from prior ED visits. Patient presented for dialysis and reevaluation after same, he appears medically stable for discharge.   Procedures ____________________________________________   LABS (pertinent positives/negatives)  Labs Reviewed  RENAL FUNCTION PANEL - Abnormal; Notable for the following components:      Result Value   Potassium 6.3 (*)    Glucose, Bld 128 (*)    BUN 52 (*)    Creatinine, Ser 9.11 (*)    Calcium 8.4 (*)    Phosphorus 5.7 (*)    Albumin 3.1 (*)    GFR calc non Af Amer 6 (*)    GFR calc Af Amer 6 (*)    All other components within normal limits  CBC  - Abnormal; Notable for the following components:   RBC 4.26 (*)    Hemoglobin 10.1 (*)    HCT 33.4 (*)    MCV 78.3 (*)    MCH 23.8 (*)    MCHC 30.4 (*)    RDW 20.2 (*)    All other components within normal limits  PHOSPHORUS - Abnormal; Notable for the following components:   Phosphorus 7.0 (*)    All other components within normal limits   FINAL ASSESSMENT AND PLAN  End-stage renal disease on dialysis   Plan: The patient had presented for reevaluation after dialysis.  She has no complaints now, appears cleared for discharge.   Laurence Aly, MD   Note: This note was generated in part or whole with voice recognition software. Voice recognition is usually quite accurate but there are transcription errors that can and very often do occur. I apologize for any typographical errors that were not detected and corrected.     Earleen Newport, MD 09/15/17 [phone]

## 2017-09-15 NOTE — ED Provider Notes (Signed)
[facility] Emergency Department Provider Note ____________________________________________   First MD Initiated Contact with Patient 09/15/17 1216     (approximate)  I have reviewed the triage vital signs and the nursing notes.   HISTORY  Chief Complaint Vascular Access Problem    HPI Stanley [name] is a 57 y.o. male  with PMH as noted below including ESRD on dialysis including ESRD on dialysis who presents for dialysis.Patient received dialysis through the emergency department.  He states he last had his dialysis on Monday, so 4 days ago.  He denies any shortness of breath, weakness, or other acute complaints.  Past Medical History:  Diagnosis Date  . Alcohol abuse   . CHF (congestive heart failure) ([place])   . Cirrhosis ([place])   . Coronary artery disease 2009  . Drug abuse ([place])   . End stage renal disease on dialysis [facility]) NEPHROLOGIST-   DR [facility]  IN [place]   HEMODIALYSIS --   TUES/  THURS/  SAT  . Gastrointestinal bleed 06/13/2017   From chart...hx of multiple GI bleeds  . GERD (gastroesophageal reflux disease)   . Hyperlipidemia   . Hypertension   . PAD (peripheral artery disease) ([place])   . Renal insufficiency    Per pt, 32 oz fluid restriction per day  . S/P triple vessel bypass 06/09/2016   2009ish  . Suicidal ideation    & HOMICIDAL IDEATION --  06-16-2013   ADMITTED TO BEHAVIOR HEALTH    Patient Active Problem List   Diagnosis Date Noted  . ESRD (end stage renal disease) on dialysis ([place]) 07/28/2017  . Protein-calorie malnutrition, severe 06/14/2017  . Encounter for dialysis Dr [facility])   . Palliative care by specialist   . Goals of care, counseling/discussion   . Malnutrition of moderate degree 06/05/2017  . Secondary esophageal varices without bleeding ([place])   . Stomach irritation   . Idiopathic esophageal varices without bleeding ([place])   . Alcoholic hepatitis with ascites 05/24/2017  . ESRD (end stage renal disease) ([place])  04/28/2017  . Uremia 03/08/2017  . ESRD on hemodialysis ([place]) 03/03/2017  . Weakness 02/28/2017  . Hypocalcemia 02/22/2017  . Shortness of breath 11/26/2016  . COPD (chronic obstructive pulmonary disease) ([place]) 10/30/2016  . COPD exacerbation ([place]) 10/29/2016  . Anemia   . Heme positive stool   . Ulceration of intestine   . Benign neoplasm of transverse colon   . Acute gastrointestinal hemorrhage   . Esophageal candidiasis ([place])   . Angiodysplasia of intestinal tract   . Acute respiratory failure with hypoxia ([place]) 07/03/2016  . GI bleeding 06/24/2016  . Rectal bleeding 06/14/2016  . Anemia of chronic disease 06/01/2016  . MRSA carrier 06/01/2016  . Chronic renal failure 05/23/2016  . Ischemic heart disease 05/23/2016  . Angiodysplasia of small intestine   . Melena   . Small bowel bleed not requiring more than 4 units of blood in 24 hours, ICU, or surgery   . Anemia due to chronic blood loss   . Abdominal pain 05/05/2016  . Acute posthemorrhagic anemia 04/17/2016  . Gastrointestinal bleed 04/17/2016  . History of esophagogastroduodenoscopy (EGD) 04/17/2016  . Elevated troponin 04/17/2016  . Alcohol abuse 04/17/2016  . Upper GI bleed 01/19/2016  . Blood in stool   . Angiodysplasia of stomach and duodenum with hemorrhage   . Gastritis   . Reflux esophagitis   . GI bleed 05/16/2015  . Acute GI bleeding   . Symptomatic anemia 04/30/2015  . HTN (hypertension)  04/06/2015  . GERD (gastroesophageal reflux disease) 04/06/2015  . HLD (hyperlipidemia) 04/06/2015  . Dyspnea 04/06/2015  . Cirrhosis of liver with ascites ([place]) 04/06/2015  . Ascites 04/06/2015  . GIB (gastrointestinal bleeding) 03/23/2015  . Homicidal ideation 06/19/2013  . Suicidal intent 06/19/2013  . Homicidal ideations 06/19/2013  . Hyperkalemia 06/16/2013  . Mandible fracture ([place]) 06/05/2013  . Fracture, mandible ([place]) 06/02/2013  . Coronary atherosclerosis of native coronary artery 06/02/2013  . ESRD on  dialysis ([place]) 06/02/2013  . Mandible open fracture ([place]) 06/02/2013    Past Surgical History:  Procedure Laterality Date  . A/V FISTULAGRAM Right 06/06/2017   Procedure: A/V FISTULAGRAM;  Surgeon: Katha Cabal, MD;  Location: [place] CV LAB;  Service: Cardiovascular;  Laterality: Right;  . A/V SHUNT INTERVENTION N/A 06/06/2017   Procedure: A/V SHUNT INTERVENTION;  Surgeon: Katha Cabal, MD;  Location: [place] CV LAB;  Service: Cardiovascular;  Laterality: N/A;  . AGILE CAPSULE N/A 06/19/2016   Procedure: AGILE CAPSULE;  Surgeon: [name], MD;  Location: [facility] ENDOSCOPY;  Service: Endoscopy;  Laterality: N/A;  . COLONOSCOPY WITH PROPOFOL N/A 06/18/2016   Procedure: COLONOSCOPY WITH PROPOFOL;  Surgeon: [name], MD;  Location: [facility] ENDOSCOPY;  Service: Endoscopy;  Laterality: N/A;  . COLONOSCOPY WITH PROPOFOL N/A 08/12/2016   Procedure: COLONOSCOPY WITH PROPOFOL;  Surgeon: [name], MD;  Location: [facility] ENDOSCOPY;  Service: Endoscopy;  Laterality: N/A;  . COLONOSCOPY WITH PROPOFOL N/A 05/05/2017   Procedure: COLONOSCOPY WITH PROPOFOL;  Surgeon: Manya Silvas, MD;  Location: [facility] ENDOSCOPY;  Service: Endoscopy;  Laterality: N/A;  . CORONARY ANGIOPLASTY  ?   PT UNABLE TO TELL IF  BEFORE OR AFTER  CABG  . CORONARY ARTERY BYPASS GRAFT  2008  ([place])   3 VESSEL  . DIALYSIS FISTULA CREATION  LAST SURGERY  APPOX  2008  . ENTEROSCOPY N/A 05/10/2016   Procedure: ENTEROSCOPY;  Surgeon: Jerene Bears, MD;  Location: [place];  Service: Gastroenterology;  Laterality: N/A;  . ENTEROSCOPY N/A 08/12/2016   Procedure: ENTEROSCOPY;  Surgeon: [name], MD;  Location: [facility] ENDOSCOPY;  Service: Endoscopy;  Laterality: N/A;  . ENTEROSCOPY Left 06/03/2017   Procedure: ENTEROSCOPY;  Surgeon: Virgel Manifold, MD;  Location: [facility] ENDOSCOPY;  Service: Endoscopy;  Laterality: Left;  Procedure date will ultimately depend on when patient is medically optimized before the  procedure, pending hemodialysis and blood transfusions etc. Will place on schedule and change depending on clinical status.   . ENTEROSCOPY N/A 06/05/2017   Procedure: ENTEROSCOPY;  Surgeon: Virgel Manifold, MD;  Location: [facility] ENDOSCOPY;  Service: Endoscopy;  Laterality: N/A;  . ENTEROSCOPY N/A 06/15/2017   Procedure: Push ENTEROSCOPY;  Surgeon: [name], MD;  Location: [facility] ENDOSCOPY;  Service: Endoscopy;  Laterality: N/A;  . ESOPHAGOGASTRODUODENOSCOPY N/A 05/07/2015   Procedure: ESOPHAGOGASTRODUODENOSCOPY (EGD);  Surgeon: Hulen Luster, MD;  Location: [facility] ENDOSCOPY;  Service: Endoscopy;  Laterality: N/A;  . ESOPHAGOGASTRODUODENOSCOPY (EGD) WITH PROPOFOL N/A 05/17/2015   Procedure: ESOPHAGOGASTRODUODENOSCOPY (EGD) WITH PROPOFOL;  Surgeon: [name], MD;  Location: [facility] ENDOSCOPY;  Service: Endoscopy;  Laterality: N/A;  . ESOPHAGOGASTRODUODENOSCOPY (EGD) WITH PROPOFOL N/A 01/20/2016   Procedure: ESOPHAGOGASTRODUODENOSCOPY (EGD) WITH PROPOFOL;  Surgeon: [name], MD;  Location: [facility] ENDOSCOPY;  Service: Endoscopy;  Laterality: N/A;  . ESOPHAGOGASTRODUODENOSCOPY (EGD) WITH PROPOFOL N/A 04/17/2016   Procedure: ESOPHAGOGASTRODUODENOSCOPY (EGD) WITH PROPOFOL;  Surgeon: Lin Landsman, MD;  Location: [facility] ENDOSCOPY;  Service: Gastroenterology;  Laterality: N/A;  . ESOPHAGOGASTRODUODENOSCOPY (EGD) WITH PROPOFOL  05/09/2016   Procedure: ESOPHAGOGASTRODUODENOSCOPY (EGD) WITH PROPOFOL;  Surgeon: Jerene Bears, MD;  Location: [place];  Service: Endoscopy;;  . ESOPHAGOGASTRODUODENOSCOPY (EGD) WITH PROPOFOL N/A 06/16/2016   Procedure: ESOPHAGOGASTRODUODENOSCOPY (EGD) WITH PROPOFOL;  Surgeon: [name], MD;  Location: [facility] ENDOSCOPY;  Service: Endoscopy;  Laterality: N/A;  . ESOPHAGOGASTRODUODENOSCOPY (EGD) WITH PROPOFOL N/A 05/05/2017   Procedure: ESOPHAGOGASTRODUODENOSCOPY (EGD) WITH PROPOFOL;  Surgeon: Manya Silvas, MD;  Location[facility] ENDOSCOPY;  Service: Endoscopy;  Laterality: N/A;  .  ESOPHAGOGASTRODUODENOSCOPY (EGD) WITH PROPOFOL N/A 06/15/2017   Procedure: ESOPHAGOGASTRODUODENOSCOPY (EGD) WITH PROPOFOL;  Surgeon: [name], MD;  Location: [facility] ENDOSCOPY;  Service: Endoscopy;  Laterality: N/A;  . GIVENS CAPSULE STUDY N/A 05/07/2016   Procedure: GIVENS CAPSULE STUDY;  Surgeon: Doran Stabler, MD;  Location: [place];  Service: Endoscopy;  Laterality: N/A;  . MANDIBULAR HARDWARE REMOVAL N/A 07/29/2013   Procedure: REMOVAL OF ARCH BARS;  Surgeon: Theodoro Kos, DO;  Location: [place];  Service: Plastics;  Laterality: N/A;  . ORIF MANDIBULAR FRACTURE N/A 06/05/2013   Procedure: REPAIR OF MANDIBULAR FRACTURE x 2 with maxillo-mandibular fixation ;  Surgeon: Theodoro Kos, DO;  Location: [place];  Service: Plastics;  Laterality: N/A;  . PERIPHERAL ARTERIAL STENT GRAFT Left     Prior to Admission medications   Medication Sig Start Date End Date Taking? Authorizing Provider  albuterol (PROVENTIL HFA;VENTOLIN HFA) 108 (90 Base) MCG/ACT inhaler Inhale 2 puffs into the lungs every 6 (six) hours as needed for wheezing or shortness of breath. 04/21/17   Lisa Roca, MD  alum & mag hydroxide-simeth (MAALOX/MYLANTA) 200-200-20 MG/5ML suspension Take 15 mLs every 4 (four) hours as needed by mouth for indigestion or heartburn. Patient not taking: Reported on 04/19/2017 01/25/17   Demetrios Loll, MD  atorvastatin (LIPITOR) 40 MG tablet Take 1 tablet (40 mg total) by mouth daily. 11/16/16   Vaughan Basta, MD  budesonide-formoterol (SYMBICORT) 160-4.5 MCG/ACT inhaler Inhale 2 puffs into the lungs daily. 11/16/16   Vaughan Basta, MD  calcium acetate (PHOSLO) 667 MG capsule Take 2,001 mg by mouth 3 (three) times daily.    [provider]  doxycycline (VIBRA-TABS) 100 MG tablet Take 1 tablet (100 mg total) by mouth 2 (two) times daily. Patient not taking: Reported on 08/28/2017 07/10/17   Harvest Dark, MD  ferrous sulfate 325 (65 FE) MG tablet Take 1  tablet by mouth 3 (three) times daily. 01/21/17   [provider]  furosemide (LASIX) 80 MG tablet Take 1 tablet (80 mg total) daily by mouth. 01/25/17   Demetrios Loll, MD  gabapentin (NEURONTIN) 300 MG capsule Take 1 capsule by mouth daily as directed 08/16/16   [provider]  ipratropium-albuterol (DUONEB) 0.5-2.5 (3) MG/3ML SOLN Take 3 mLs by nebulization every 6 (six) hours as needed. 11/28/16   Loletha Grayer, MD  labetalol (NORMODYNE) 100 MG tablet Take 100 mg by mouth daily. 03/24/17   [provider]  Multiple Vitamins-Minerals-FA (DIALYVITE SUPREME D) 3 MG TABS Take 1 tablet (3 mg total) by mouth daily. 11/28/16   Loletha Grayer, MD  nitroGLYCERIN (NITROSTAT) 0.4 MG SL tablet Place 1 tablet (0.4 mg total) under the tongue every 5 (five) minutes as needed. 04/12/16   Wende Bushy, MD  pantoprazole (PROTONIX) 40 MG tablet Take 1 tablet (40 mg total) by mouth daily. 11/28/16   Loletha Grayer, MD  Spacer/Aero Chamber Mouthpiece MISC 1 Units by Does not apply route every 4 (four) hours as needed (wheezing). 02/19/17   Rifenbark,  Milta Deiters, MD  tiotropium (SPIRIVA HANDIHALER) 18 MCG inhalation capsule Place 1 capsule (18 mcg total) into inhaler and inhale daily. 11/16/16 11/16/17  Vaughan Basta, MD    Allergies Patient has no known allergies.  Family History  Problem Relation Age of Onset  . Colon cancer Mother   . Cancer Father   . Cancer Sister   . Kidney disease Brother     Social History Social History   Tobacco Use  . Smoking status: Current Every Day Smoker    Packs/day: 0.15    Years: 40.00    Pack years: 6.00    Types: Cigarettes  . Smokeless tobacco: Never Used  Substance Use Topics  . Alcohol use: No    Comment: pt reports quitting after learning about cirrhosis  . Drug use: No    Frequency: 7.0 times per week    Types: Marijuana, Cocaine    Review of Systems  Constitutional: No fever. Cardiovascular: Denies chest pain. Respiratory:  Denies shortness of breath. Gastrointestinal: No vomiting. Musculoskeletal: Negative for back pain. Neurological: Negative for headache.   ____________________________________________   PHYSICAL EXAM:  VITAL SIGNS: ED Triage Vitals  Enc Vitals Group     BP 09/15/17 1158 (!) 147/83     Pulse Rate 09/15/17 1158 88     Resp 09/15/17 1158 18     Temp 09/15/17 1158 98.6 F (37 C)     Temp Source 09/15/17 1158 Oral     SpO2 09/15/17 1158 97 %     Weight 09/15/17 1159 155 lb (70.3 kg)     Height 09/15/17 1159 6\' 3"  (1.905 m)     Head Circumference --      Peak Flow --      Pain Score 09/15/17 1159 0     Pain Loc --      Pain Edu? --      Excl. in [place]? --     Constitutional: Alert and oriented. Well appearing and in no acute distress. Eyes: Conjunctivae are normal.  Head: Atraumatic. Nose: No congestion/rhinnorhea. Mouth/Throat: Mucous membranes are moist.   Neck: Normal range of motion.  Cardiovascular:  Good peripheral circulation. Respiratory: Normal respiratory effort.  Gastrointestinal: No distention.  Musculoskeletal: Extremities warm and well perfused.  Neurologic:  Normal speech and language.  Skin:  Skin is warm and dry.  Psychiatric: Mood and affect are normal. Speech and behavior are normal.  ____________________________________________   LABS (all labs ordered are listed, but only abnormal results are displayed)  Labs Reviewed  RENAL FUNCTION PANEL  CBC  PHOSPHORUS   ____________________________________________  EKG   ____________________________________________  RADIOLOGY    ____________________________________________   PROCEDURES  Procedure(s) performed: No  Procedures  Critical Care performed: No ____________________________________________   INITIAL IMPRESSION / ASSESSMENT AND PLAN / ED COURSE  Pertinent labs & imaging results that were available during my care of the patient were reviewed by me and considered in my medical  decision making (see chart for details).  57 year old male with ESRD on dialysis presents for dialysis.  The patient is stable and asymptomatic at this time.  Vital signs are normal.  We will proceed with taking the patient to dialysis.  Anticipate discharge home afterwards.     ____________________________________________   FINAL CLINICAL IMPRESSION(S) / ED DIAGNOSES  Final diagnoses:  End stage renal disease ([place])      NEW MEDICATIONS STARTED DURING THIS VISIT:  New Prescriptions   No medications on file     Note:  This document  was prepared using Systems analyst and may include unintentional dictation errors.   Arta Silence, MD 09/15/17 1249

## 2017-09-15 NOTE — ED Notes (Signed)
Pt to dialysis at this time

## 2017-09-18 ENCOUNTER — Encounter: Payer: Self-pay | Admitting: Emergency Medicine

## 2017-09-18 ENCOUNTER — Non-Acute Institutional Stay
Admission: EM | Admit: 2017-09-18 | Discharge: 2017-09-18 | Disposition: A | Discharge status: Home / Self Care | Payer: Medicare Other | Attending: Emergency Medicine | Admitting: Emergency Medicine

## 2017-09-18 DIAGNOSIS — N186 End stage renal disease: Secondary | ICD-10-CM | POA: Diagnosis not present

## 2017-09-18 DIAGNOSIS — F17210 Nicotine dependence, cigarettes, uncomplicated: Secondary | ICD-10-CM | POA: Diagnosis not present

## 2017-09-18 DIAGNOSIS — Z951 Presence of aortocoronary bypass graft: Secondary | ICD-10-CM | POA: Diagnosis not present

## 2017-09-18 DIAGNOSIS — K219 Gastro-esophageal reflux disease without esophagitis: Secondary | ICD-10-CM | POA: Insufficient documentation

## 2017-09-18 DIAGNOSIS — E785 Hyperlipidemia, unspecified: Secondary | ICD-10-CM | POA: Insufficient documentation

## 2017-09-18 DIAGNOSIS — J449 Chronic obstructive pulmonary disease, unspecified: Secondary | ICD-10-CM | POA: Insufficient documentation

## 2017-09-18 DIAGNOSIS — I509 Heart failure, unspecified: Secondary | ICD-10-CM | POA: Insufficient documentation

## 2017-09-18 DIAGNOSIS — I739 Peripheral vascular disease, unspecified: Secondary | ICD-10-CM | POA: Diagnosis not present

## 2017-09-18 DIAGNOSIS — Z992 Dependence on renal dialysis: Secondary | ICD-10-CM

## 2017-09-18 DIAGNOSIS — I8510 Secondary esophageal varices without bleeding: Secondary | ICD-10-CM | POA: Diagnosis not present

## 2017-09-18 DIAGNOSIS — I132 Hypertensive heart and chronic kidney disease with heart failure and with stage 5 chronic kidney disease, or end stage renal disease: Secondary | ICD-10-CM | POA: Diagnosis not present

## 2017-09-18 DIAGNOSIS — Z7951 Long term (current) use of inhaled steroids: Secondary | ICD-10-CM | POA: Diagnosis not present

## 2017-09-18 DIAGNOSIS — I2510 Atherosclerotic heart disease of native coronary artery without angina pectoris: Secondary | ICD-10-CM | POA: Insufficient documentation

## 2017-09-18 DIAGNOSIS — K7460 Unspecified cirrhosis of liver: Secondary | ICD-10-CM | POA: Diagnosis not present

## 2017-09-18 DIAGNOSIS — K7010 Alcoholic hepatitis without ascites: Secondary | ICD-10-CM | POA: Insufficient documentation

## 2017-09-18 LAB — RENAL FUNCTION PANEL
ANION GAP: 13 (ref 5–15)
Albumin: 3.1 g/dL — ABNORMAL LOW (ref 3.5–5.0)
BUN: 57 mg/dL — ABNORMAL HIGH (ref 6–20)
CALCIUM: 8.5 mg/dL — AB (ref 8.9–10.3)
CO2: 24 mmol/L (ref 22–32)
CREATININE: 9.98 mg/dL — AB (ref 0.61–1.24)
Chloride: 103 mmol/L (ref 98–111)
GFR, EST AFRICAN AMERICAN: 6 mL/min — AB (ref >60)
GFR, EST NON AFRICAN AMERICAN: 5 mL/min — AB (ref >60)
Glucose, Bld: 84 mg/dL (ref 70–99)
Phosphorus: 7.4 mg/dL — ABNORMAL HIGH (ref 2.5–4.6)
Potassium: 6.6 mmol/L (ref 3.5–5.1)
SODIUM: 140 mmol/L (ref 135–145)

## 2017-09-18 LAB — CBC
HCT: 32.1 % — ABNORMAL LOW (ref 40.0–52.0)
HEMOGLOBIN: 9.8 g/dL — AB (ref 13.0–18.0)
MCH: 24 pg — ABNORMAL LOW (ref 26.0–34.0)
MCHC: 30.6 g/dL — ABNORMAL LOW (ref 32.0–36.0)
MCV: 78.2 fL — ABNORMAL LOW (ref 80.0–100.0)
PLATELETS: 284 10*3/uL (ref 150–440)
RBC: 4.1 MIL/uL — AB (ref 4.40–5.90)
RDW: 20.6 % — ABNORMAL HIGH (ref 11.5–14.5)
WBC: 5.8 10*3/uL (ref 3.8–10.6)

## 2017-09-18 MED ORDER — LIDOCAINE-PRILOCAINE 2.5-2.5 % EX CREA: 1.0000 "application " | TOPICAL_CREAM | CUTANEOUS | Status: DC | PRN

## 2017-09-18 MED ORDER — GABAPENTIN 100 MG PO CAPS
100.0000 mg | ORAL_CAPSULE | Freq: Once | ORAL | Status: AC
Start: 2017-09-18 — End: 2017-09-18
  Administered 2017-09-18: 100 mg via ORAL
  Filled 2017-09-18: qty 1

## 2017-09-18 MED ORDER — SODIUM CHLORIDE 0.9 % IV SOLN: 100.0000 mL | INTRAVENOUS | Status: DC | PRN

## 2017-09-18 MED ORDER — PENTAFLUOROPROP-TETRAFLUOROETH EX AERO: 1.0000 "application " | INHALATION_SPRAY | CUTANEOUS | Status: DC | PRN

## 2017-09-18 MED ORDER — ALTEPLASE 2 MG IJ SOLR
2.0000 mg | Freq: Once | INTRAMUSCULAR | Status: DC | PRN
  Filled 2017-09-18: qty 2

## 2017-09-18 MED ORDER — DIPHENHYDRAMINE HCL 50 MG/ML IJ SOLN
25.0000 mg | Freq: Once | INTRAMUSCULAR | Status: AC
  Administered 2017-09-18: 25 mg via INTRAVENOUS

## 2017-09-18 MED ORDER — HEPARIN SODIUM (PORCINE) 1000 UNIT/ML DIALYSIS
1000.0000 [IU] | INTRAMUSCULAR | Status: DC | PRN
  Filled 2017-09-18: qty 1

## 2017-09-18 MED ORDER — LIDOCAINE HCL (PF) 1 % IJ SOLN: 5.0000 mL | INTRAMUSCULAR | Status: DC | PRN

## 2017-09-18 MED ORDER — CHLORHEXIDINE GLUCONATE CLOTH 2 % EX PADS: 6.0000 | MEDICATED_PAD | Freq: Every day | CUTANEOUS | Status: DC

## 2017-09-18 NOTE — Progress Notes (Signed)
HD tx end   09/18/17 1800  Vital Signs  Pulse Rate 82  Pulse Rate Source Monitor  Resp 15  BP 136/80  BP Location Left Arm  BP Method Automatic  Patient Position (if appropriate) Lying  Oxygen Therapy  SpO2 96 %  O2 Device Room Air  During Hemodialysis Assessment  Dialysis Fluid Bolus Normal Saline  Bolus Amount (mL) 250 mL  Intra-Hemodialysis Comments Tx completed

## 2017-09-18 NOTE — ED Triage Notes (Signed)
Patient presents to the ED for regular dialysis treatment.

## 2017-09-18 NOTE — Progress Notes (Signed)
Pharmacy contacted regarding pt medication to be given during HD tx.

## 2017-09-18 NOTE — Progress Notes (Signed)
Pre HD assessment   09/18/17 1415  Vital Signs  Temp 98.1 F (36.7 C)  Temp Source Oral  Pulse Rate 80  Pulse Rate Source Monitor  Resp 17  BP (!) 138/100  BP Location Left Arm  BP Method Automatic  Patient Position (if appropriate) Lying  Oxygen Therapy  SpO2 100 %  O2 Device Room Air  Pain Assessment  Pain Scale 0-10  Pain Score 0  Dialysis Weight  Weight 70.3 kg (154 lb 15.7 oz)  Type of Weight Pre-Dialysis  Time-Out for Hemodialysis  What Procedure? HD  Pt Identifiers(min of two) First/Last Name;MRN/Account#  Correct Site? Yes  Correct Side? Yes  Correct Procedure? Yes  Consents Verified? Yes  Rad Studies Available? N/A  Safety Precautions Reviewed? Yes  Engineer, civil (consulting) Number  ([id number])  Station Number 1  UF/Alarm Test Passed  Conductivity: Meter 13.8  Conductivity: Machine  14.1  pH 7.6  Reverse Osmosis main  Normal Saline Lot Number [id number]  Dialyzer Lot Number [id number]  Disposable Set Lot Number [id number]  Machine Temperature 98.6 F (37 C)  Musician and Audible Yes  Blood Lines Intact and Secured Yes  Pre Treatment Patient Checks  Vascular access used during treatment Catheter  Patient is receiving dialysis in a chair Yes  Hepatitis B Surface Antigen Results Negative  Date Hepatitis B Surface Antigen Drawn 06/16/17  Hepatitis B Surface Antibody 50  Date Hepatitis B Surface Antibody Drawn 06/16/17  Hemodialysis Consent Verified Yes  Hemodialysis Standing Orders Initiated Yes  ECG (Telemetry) Monitor On Yes  Prime Ordered Normal Saline  Length of  DialysisTreatment -hour(s) 3.5 Hour(s)  Dialyzer Elisio 17H NR  Dialysate 1K  Dialysis Anticoagulant None  Dialysate Flow Ordered 800  Blood Flow Rate Ordered 400 mL/min  Ultrafiltration Goal 3 Liters  Pre Treatment Labs Renal panel;CBC  Dialysis Blood Pressure Support Ordered Normal Saline  Education / Care Plan  Dialysis Education Provided Yes  Documented Education in Care Plan Yes   Hemodialysis Catheter Right Internal jugular Double-lumen  No Placement Date or Time found.   Placed prior to admission: Yes  Orientation: Right  Access Location: Internal jugular  Hemodialysis Catheter Type: Double-lumen  Site Condition No complications  Blue Lumen Status Heparin locked  Red Lumen Status Heparin locked  Dressing Type Biopatch  Dressing Status Clean;Dry;Intact  Drainage Description None

## 2017-09-18 NOTE — ED Notes (Signed)
Patient left ED prior to obtaining discharge signature.

## 2017-09-18 NOTE — Progress Notes (Signed)
Pre HD assessment    09/18/17 1416  Neurological  Level of Consciousness Alert  Orientation Level Oriented X4  Respiratory  Respiratory Pattern Regular;Unlabored  Chest Assessment Chest expansion symmetrical  Cough Non-productive  Cardiac  Pulse Regular  ECG Monitor Yes  Vascular  R Radial Pulse +2  L Radial Pulse +2  Edema Generalized  Integumentary  Integumentary (WDL) X  Skin Color Appropriate for ethnicity  Skin Condition Itching  Musculoskeletal  Musculoskeletal (WDL) X  Generalized Weakness Yes  Assistive Device None  GU Assessment  Genitourinary (WDL) X  Genitourinary Symptoms  (HD)  Psychosocial  Psychosocial (WDL) WDL

## 2017-09-18 NOTE — Progress Notes (Signed)
Post HD assessment. Pt tolerated tx well without c/o or complication. Net UF 4004, goal met.    09/18/17 1808  Hand-Off documentation  Report given to (Full Name) Cleatis Polka  Report received from (Full Name) Stark Bray  Vital Signs  Temp 98.5 F (36.9 C)  Temp Source Oral  Pulse Rate 81  Pulse Rate Source Monitor  Resp 20  BP 120/81  BP Location Left Arm  BP Method Automatic  Patient Position (if appropriate) Lying  Oxygen Therapy  SpO2 100 %  O2 Device Room Air  Dialysis Weight  Weight 68 kg (149 lb 14.6 oz)  Type of Weight Post-Dialysis  Post-Hemodialysis Assessment  Rinseback Volume (mL) 250 mL  KECN 76.5 V  Dialyzer Clearance Lightly streaked  Duration of HD Treatment -hour(s) 3.5 hour(s)  Hemodialysis Intake (mL) 500 mL  UF Total -Machine (mL) 4504 mL  Net UF (mL) 4004 mL  Tolerated HD Treatment Yes  Education / Care Plan  Dialysis Education Provided Yes  Documented Education in Care Plan Yes  Hemodialysis Catheter Right Internal jugular Double-lumen  No Placement Date or Time found.   Placed prior to admission: Yes  Orientation: Right  Access Location: Internal jugular  Hemodialysis Catheter Type: Double-lumen  Site Condition No complications  Blue Lumen Status Heparin locked  Red Lumen Status Heparin locked  Purple Lumen Status N/A  Catheter fill solution Heparin 1000 units/ml  Catheter fill volume (Arterial) 1.5 cc  Catheter fill volume (Venous) 1.5  Dressing Type Biopatch  Dressing Status Clean;Dry;Intact  Drainage Description None  Post treatment catheter status Capped and Clamped

## 2017-09-18 NOTE — ED Provider Notes (Addendum)
[facility] Emergency Department Provider Note  ____________________________________________   I have reviewed the triage vital signs and the nursing notes. Where available I have reviewed prior notes and, if possible and indicated, outside hospital notes.    HISTORY  Chief Complaint Dialysis (Dialysis)    HPI Stanley [name] is a 57 y.o. male  He receives his routine dialysis in the emergency department for some reason.  He is here for his routine dialysis.  No other complaints.  Seen here on the fifth.     Past Medical History:  Diagnosis Date  . Alcohol abuse   . CHF (congestive heart failure) ([place])   . Cirrhosis ([place])   . Coronary artery disease 2009  . Drug abuse ([place])   . End stage renal disease on dialysis [facility]) NEPHROLOGIST-   DR [facility] [place]  IN [place]   HEMODIALYSIS --   TUES/  THURS/  SAT  . Gastrointestinal bleed 06/13/2017   From chart...hx of multiple GI bleeds  . GERD (gastroesophageal reflux disease)   . Hyperlipidemia   . Hypertension   . PAD (peripheral artery disease) ([place])   . Renal insufficiency    Per pt, 32 oz fluid restriction per day  . S/P triple vessel bypass 06/09/2016   2009ish  . Suicidal ideation    & HOMICIDAL IDEATION --  06-16-2013   ADMITTED TO BEHAVIOR HEALTH    Patient Active Problem List   Diagnosis Date Noted  . ESRD (end stage renal disease) on dialysis ([place]) 07/28/2017  . Protein-calorie malnutrition, severe 06/14/2017  . Encounter for dialysis [facility])   . Palliative care by specialist   . Goals of care, counseling/discussion   . Malnutrition of moderate degree 06/05/2017  . Secondary esophageal varices without bleeding ([place])   . Stomach irritation   . Idiopathic esophageal varices without bleeding ([place])   . Alcoholic hepatitis with ascites 05/24/2017  . ESRD (end stage renal disease) ([place]) 04/28/2017  . Uremia 03/08/2017  . ESRD on hemodialysis ([place]) 03/03/2017  . Weakness 02/28/2017  .  Hypocalcemia 02/22/2017  . Shortness of breath 11/26/2016  . COPD (chronic obstructive pulmonary disease) ([place]) 10/30/2016  . COPD exacerbation ([place]) 10/29/2016  . Anemia   . Heme positive stool   . Ulceration of intestine   . Benign neoplasm of transverse colon   . Acute gastrointestinal hemorrhage   . Esophageal candidiasis ([place])   . Angiodysplasia of intestinal tract   . Acute respiratory failure with hypoxia ([place]) 07/03/2016  . GI bleeding 06/24/2016  . Rectal bleeding 06/14/2016  . Anemia of chronic disease 06/01/2016  . MRSA carrier 06/01/2016  . Chronic renal failure 05/23/2016  . Ischemic heart disease 05/23/2016  . Angiodysplasia of small intestine   . Melena   . Small bowel bleed not requiring more than 4 units of blood in 24 hours, ICU, or surgery   . Anemia due to chronic blood loss   . Abdominal pain 05/05/2016  . Acute posthemorrhagic anemia 04/17/2016  . Gastrointestinal bleed 04/17/2016  . History of esophagogastroduodenoscopy (EGD) 04/17/2016  . Elevated troponin 04/17/2016  . Alcohol abuse 04/17/2016  . Upper GI bleed 01/19/2016  . Blood in stool   . Angiodysplasia of stomach and duodenum with hemorrhage   . Gastritis   . Reflux esophagitis   . GI bleed 05/16/2015  . Acute GI bleeding   . Symptomatic anemia 04/30/2015  . HTN (hypertension) 04/06/2015  . GERD (gastroesophageal reflux disease) 04/06/2015  . HLD (hyperlipidemia) 04/06/2015  .  Dyspnea 04/06/2015  . Cirrhosis of liver with ascites ([place]) 04/06/2015  . Ascites 04/06/2015  . GIB (gastrointestinal bleeding) 03/23/2015  . Homicidal ideation 06/19/2013  . Suicidal intent 06/19/2013  . Homicidal ideations 06/19/2013  . Hyperkalemia 06/16/2013  . Mandible fracture ([place]) 06/05/2013  . Fracture, mandible ([place]) 06/02/2013  . Coronary atherosclerosis of native coronary artery 06/02/2013  . ESRD on dialysis ([place]) 06/02/2013  . Mandible open fracture ([place]) 06/02/2013    Past Surgical History:   Procedure Laterality Date  . A/V FISTULAGRAM Right 06/06/2017   Procedure: A/V FISTULAGRAM;  Surgeon: Katha Cabal, MD;  Location: [place] CV LAB;  Service: Cardiovascular;  Laterality: Right;  . A/V SHUNT INTERVENTION N/A 06/06/2017   Procedure: A/V SHUNT INTERVENTION;  Surgeon: Katha Cabal, MD;  Location: [place] CV LAB;  Service: Cardiovascular;  Laterality: N/A;  . AGILE CAPSULE N/A 06/19/2016   Procedure: AGILE CAPSULE;  Surgeon: [name], MD;  Location: [facility] ENDOSCOPY;  Service: Endoscopy;  Laterality: N/A;  . COLONOSCOPY WITH PROPOFOL N/A 06/18/2016   Procedure: COLONOSCOPY WITH PROPOFOL;  Surgeon: [name], MD;  Location: [facility] ENDOSCOPY;  Service: Endoscopy;  Laterality: N/A;  . COLONOSCOPY WITH PROPOFOL N/A 08/12/2016   Procedure: COLONOSCOPY WITH PROPOFOL;  Surgeon: [name], MD;  Location: [facility] ENDOSCOPY;  Service: Endoscopy;  Laterality: N/A;  . COLONOSCOPY WITH PROPOFOL N/A 05/05/2017   Procedure: COLONOSCOPY WITH PROPOFOL;  Surgeon: Manya Silvas, MD;  Location: [facility] ENDOSCOPY;  Service: Endoscopy;  Laterality: N/A;  . CORONARY ANGIOPLASTY  ?   PT UNABLE TO TELL IF  BEFORE OR AFTER  CABG  . CORONARY ARTERY BYPASS GRAFT  2008  ([place])   3 VESSEL  . DIALYSIS FISTULA CREATION  LAST SURGERY  APPOX  2008  . ENTEROSCOPY N/A 05/10/2016   Procedure: ENTEROSCOPY;  Surgeon: Jerene Bears, MD;  Location: [place];  Service: Gastroenterology;  Laterality: N/A;  . ENTEROSCOPY N/A 08/12/2016   Procedure: ENTEROSCOPY;  Surgeon: [name], MD;  Location: [facility] ENDOSCOPY;  Service: Endoscopy;  Laterality: N/A;  . ENTEROSCOPY Left 06/03/2017   Procedure: ENTEROSCOPY;  Surgeon: Virgel Manifold, MD;  Location: [facility] ENDOSCOPY;  Service: Endoscopy;  Laterality: Left;  Procedure date will ultimately depend on when patient is medically optimized before the procedure, pending hemodialysis and blood transfusions etc. Will place on schedule and change depending  on clinical status.   . ENTEROSCOPY N/A 06/05/2017   Procedure: ENTEROSCOPY;  Surgeon: Virgel Manifold, MD;  Location: [facility] ENDOSCOPY;  Service: Endoscopy;  Laterality: N/A;  . ENTEROSCOPY N/A 06/15/2017   Procedure: Push ENTEROSCOPY;  Surgeon: [name], MD;  Location: [facility] ENDOSCOPY;  Service: Endoscopy;  Laterality: N/A;  . ESOPHAGOGASTRODUODENOSCOPY N/A 05/07/2015   Procedure: ESOPHAGOGASTRODUODENOSCOPY (EGD);  Surgeon: Hulen Luster, MD;  Location: [facility] ENDOSCOPY;  Service: Endoscopy;  Laterality: N/A;  . ESOPHAGOGASTRODUODENOSCOPY (EGD) WITH PROPOFOL N/A 05/17/2015   Procedure: ESOPHAGOGASTRODUODENOSCOPY (EGD) WITH PROPOFOL;  Surgeon: [name], MD;  Location: [facility] ENDOSCOPY;  Service: Endoscopy;  Laterality: N/A;  . ESOPHAGOGASTRODUODENOSCOPY (EGD) WITH PROPOFOL N/A 01/20/2016   Procedure: ESOPHAGOGASTRODUODENOSCOPY (EGD) WITH PROPOFOL;  Surgeon: [name], MD;  Location: [facility] ENDOSCOPY;  Service: Endoscopy;  Laterality: N/A;  . ESOPHAGOGASTRODUODENOSCOPY (EGD) WITH PROPOFOL N/A 04/17/2016   Procedure: ESOPHAGOGASTRODUODENOSCOPY (EGD) WITH PROPOFOL;  Surgeon: Lin Landsman, MD;  Location: [facility] ENDOSCOPY;  Service: Gastroenterology;  Laterality: N/A;  . ESOPHAGOGASTRODUODENOSCOPY (EGD) WITH PROPOFOL  05/09/2016   Procedure: ESOPHAGOGASTRODUODENOSCOPY (EGD) WITH PROPOFOL;  Surgeon: Jerene Bears, MD;  Location: [facility] ENDOSCOPY;  Service: Endoscopy;;  . ESOPHAGOGASTRODUODENOSCOPY (EGD) WITH PROPOFOL N/A 06/16/2016   Procedure: ESOPHAGOGASTRODUODENOSCOPY (EGD) WITH PROPOFOL;  Surgeon: [name], MD;  Location: [facility] ENDOSCOPY;  Service: Endoscopy;  Laterality: N/A;  . ESOPHAGOGASTRODUODENOSCOPY (EGD) WITH PROPOFOL N/A 05/05/2017   Procedure: ESOPHAGOGASTRODUODENOSCOPY (EGD) WITH PROPOFOL;  Surgeon: Manya Silvas, MD;  Location: [facility] ENDOSCOPY;  Service: Endoscopy;  Laterality: N/A;  . ESOPHAGOGASTRODUODENOSCOPY (EGD) WITH PROPOFOL N/A 06/15/2017   Procedure: ESOPHAGOGASTRODUODENOSCOPY (EGD) WITH  PROPOFOL;  Surgeon: [name], MD;  Location: [facility] ENDOSCOPY;  Service: Endoscopy;  Laterality: N/A;  . GIVENS CAPSULE STUDY N/A 05/07/2016   Procedure: GIVENS CAPSULE STUDY;  Surgeon: Doran Stabler, MD;  Location: [place];  Service: Endoscopy;  Laterality: N/A;  . MANDIBULAR HARDWARE REMOVAL N/A 07/29/2013   Procedure: REMOVAL OF ARCH BARS;  Surgeon: Theodoro Kos, DO;  Location: [place];  Service: Plastics;  Laterality: N/A;  . ORIF MANDIBULAR FRACTURE N/A 06/05/2013   Procedure: REPAIR OF MANDIBULAR FRACTURE x 2 with maxillo-mandibular fixation ;  Surgeon: Theodoro Kos, DO;  Location: [place];  Service: Plastics;  Laterality: N/A;  . PERIPHERAL ARTERIAL STENT GRAFT Left     Prior to Admission medications   Medication Sig Start Date End Date Taking? Authorizing Provider  albuterol (PROVENTIL HFA;VENTOLIN HFA) 108 (90 Base) MCG/ACT inhaler Inhale 2 puffs into the lungs every 6 (six) hours as needed for wheezing or shortness of breath. 04/21/17   Lisa Roca, MD  alum & mag hydroxide-simeth (MAALOX/MYLANTA) 200-200-20 MG/5ML suspension Take 15 mLs every 4 (four) hours as needed by mouth for indigestion or heartburn. Patient not taking: Reported on 04/19/2017 01/25/17   Demetrios Loll, MD  atorvastatin (LIPITOR) 40 MG tablet Take 1 tablet (40 mg total) by mouth daily. 11/16/16   Vaughan Basta, MD  budesonide-formoterol (SYMBICORT) 160-4.5 MCG/ACT inhaler Inhale 2 puffs into the lungs daily. 11/16/16   Vaughan Basta, MD  calcium acetate (PHOSLO) 667 MG capsule Take 2,001 mg by mouth 3 (three) times daily.    [provider]  doxycycline (VIBRA-TABS) 100 MG tablet Take 1 tablet (100 mg total) by mouth 2 (two) times daily. Patient not taking: Reported on 08/28/2017 07/10/17   Harvest Dark, MD  ferrous sulfate 325 (65 FE) MG tablet Take 1 tablet by mouth 3 (three) times daily. 01/21/17   [provider]  furosemide (LASIX) 80 MG tablet Take 1  tablet (80 mg total) daily by mouth. 01/25/17   Demetrios Loll, MD  gabapentin (NEURONTIN) 300 MG capsule Take 1 capsule by mouth daily as directed 08/16/16   [provider]  ipratropium-albuterol (DUONEB) 0.5-2.5 (3) MG/3ML SOLN Take 3 mLs by nebulization every 6 (six) hours as needed. 11/28/16   Loletha Grayer, MD  labetalol (NORMODYNE) 100 MG tablet Take 100 mg by mouth daily. 03/24/17   [provider]  Multiple Vitamins-Minerals-FA (DIALYVITE SUPREME D) 3 MG TABS Take 1 tablet (3 mg total) by mouth daily. 11/28/16   Loletha Grayer, MD  nitroGLYCERIN (NITROSTAT) 0.4 MG SL tablet Place 1 tablet (0.4 mg total) under the tongue every 5 (five) minutes as needed. 04/12/16   Wende Bushy, MD  pantoprazole (PROTONIX) 40 MG tablet Take 1 tablet (40 mg total) by mouth daily. 11/28/16   Loletha Grayer, MD  Spacer/Aero Chamber Mouthpiece MISC 1 Units by Does not apply route every 4 (four) hours as needed (wheezing). 02/19/17   Darel Hong, MD  tiotropium (SPIRIVA HANDIHALER) 18 MCG inhalation capsule Place 1 capsule (18 mcg  total) into inhaler and inhale daily. 11/16/16 11/16/17  Vaughan Basta, MD    Allergies Patient has no known allergies.  Family History  Problem Relation Age of Onset  . Colon cancer Mother   . Cancer Father   . Cancer Sister   . Kidney disease Brother     Social History Social History   Tobacco Use  . Smoking status: Current Every Day Smoker    Packs/day: 0.15    Years: 40.00    Pack years: 6.00    Types: Cigarettes  . Smokeless tobacco: Never Used  Substance Use Topics  . Alcohol use: No    Comment: pt reports quitting after learning about cirrhosis  . Drug use: No    Frequency: 7.0 times per week    Types: Marijuana, Cocaine    Review of Systems See HPI, patient with no complaints  ____________________________________________   PHYSICAL EXAM:  VITAL SIGNS: ED Triage Vitals  Enc Vitals Group     BP 09/18/17 1144 124/87      Pulse Rate 09/18/17 1144 74     Resp 09/18/17 1144 16     Temp 09/18/17 1144 97.8 F (36.6 C)     Temp Source 09/18/17 1144 Oral     SpO2 09/18/17 1144 98 %     Weight 09/18/17 1140 155 lb (70.3 kg)     Height 09/18/17 1140 6\' 3"  (1.905 m)     Head Circumference --      Peak Flow --      Pain Score 09/18/17 1140 0     Pain Loc --      Pain Edu? --      Excl. in [place]? --     Constitutional: Alert and oriented. Well appearing and in no acute distress. Cardiovascular: Normal rate, regular rhythm. Grossly normal heart sounds.  Good peripheral circulation. Respiratory: Normal respiratory effort.  No retractions. Lungs CTAB. Abdominal: Soft and nontender. No distention. No guarding no rebound Skin:  Skin is warm, dry and intact. No rash noted. Psychiatric: Mood and affect are normal. Speech and behavior are normal.  ____________________________________________   LABS (all labs ordered are listed, but only abnormal results are displayed)  Labs Reviewed - No data to display  Pertinent labs  results that were available during my care of the patient were reviewed by me and considered in my medical decision making (see chart for details). ____________________________________________  EKG  I personally interpreted any EKGs ordered by me or triage  ____________________________________________  RADIOLOGY  Pertinent labs & imaging results that were available during my care of the patient were reviewed by me and considered in my medical decision making (see chart for details). If possible, patient and/or family made aware of any abnormal findings.  No results found. ____________________________________________    PROCEDURES  Procedure(s) performed: None  Procedures  Critical Care performed: None  ____________________________________________   INITIAL IMPRESSION / ASSESSMENT AND PLAN / ED COURSE  Pertinent labs & imaging results that were available during my care of the  patient were reviewed by me and considered in my medical decision making (see chart for details).  Patient here for routine dialysis in the emergency room.  For some reason.  ----------------------------------------- 6:21 PM on 09/18/2017 -----------------------------------------  Came back from dialysis and then left the building without waiting for discharge instructions as he usually does.    ____________________________________________   FINAL CLINICAL IMPRESSION(S) / ED DIAGNOSES  Final diagnoses:  None      This chart  was dictated using voice recognition software.  Despite best efforts to proofread,  errors can occur which can change meaning.      Schuyler Amor, MD 09/18/17 1200   Schuyler Amor, MD 09/18/17 [phone]

## 2017-09-18 NOTE — Progress Notes (Signed)
HD tx start    09/18/17 1423  Vital Signs  Pulse Rate 81  Pulse Rate Source Monitor  Resp (!) 22  BP (!) 138/100  BP Location Left Arm  BP Method Automatic  Patient Position (if appropriate) Lying  Oxygen Therapy  SpO2 100 %  O2 Device Room Air  During Hemodialysis Assessment  Blood Flow Rate (mL/min) 400 mL/min  Arterial Pressure (mmHg) -170 mmHg  Venous Pressure (mmHg) 170 mmHg  Transmembrane Pressure (mmHg) 50 mmHg  Ultrafiltration Rate (mL/min) 1290 mL/min  Dialysate Flow Rate (mL/min) 800 ml/min  Conductivity: Machine  14.4  HD Safety Checks Performed Yes  Dialysis Fluid Bolus Normal Saline  Bolus Amount (mL) 250 mL  Intra-Hemodialysis Comments Tx initiated  Hemodialysis Catheter Right Internal jugular Double-lumen  No Placement Date or Time found.   Placed prior to admission: Yes  Orientation: Right  Access Location: Internal jugular  Hemodialysis Catheter Type: Double-lumen  Blue Lumen Status Infusing  Red Lumen Status Infusing

## 2017-09-18 NOTE — ED Notes (Signed)
Spoke with Lenna Sciara, RN from dialysis who states she will be up directly to get patient for dialysis. Patient placed in wheelchair and informed that transport would be here shortly.

## 2017-09-18 NOTE — ED Notes (Signed)
Patient transported to back to ED by EDT from dialysis via wheelchair. Upon arrival to ED, patient refusing to wait for discharge papers or vitals. EDP aware and ok with patient's departure. Patient ambulatory to lobby with steady gait and NAD noted.

## 2017-09-18 NOTE — ED Notes (Signed)
Spoke with in dialysis nurse who states she is doing bedside treatment and is the only one here today so it will be approximately 1330 before patient is able to have dialysis. Patient and MD made aware.

## 2017-09-18 NOTE — Progress Notes (Signed)
Pt walked out of the dialysis unit, did not wait for ED transport. ED RN notified.

## 2017-09-20 ENCOUNTER — Non-Acute Institutional Stay
Admission: EM | Admit: 2017-09-20 | Discharge: 2017-09-20 | Disposition: A | Discharge status: Home / Self Care | Payer: Medicare Other | Attending: Emergency Medicine | Admitting: Emergency Medicine

## 2017-09-20 ENCOUNTER — Encounter: Payer: Self-pay | Admitting: Emergency Medicine

## 2017-09-20 DIAGNOSIS — I8510 Secondary esophageal varices without bleeding: Secondary | ICD-10-CM | POA: Insufficient documentation

## 2017-09-20 DIAGNOSIS — Z800 Family history of malignant neoplasm of digestive organs: Secondary | ICD-10-CM | POA: Diagnosis not present

## 2017-09-20 DIAGNOSIS — I2510 Atherosclerotic heart disease of native coronary artery without angina pectoris: Secondary | ICD-10-CM | POA: Insufficient documentation

## 2017-09-20 DIAGNOSIS — R188 Other ascites: Secondary | ICD-10-CM | POA: Diagnosis not present

## 2017-09-20 DIAGNOSIS — D123 Benign neoplasm of transverse colon: Secondary | ICD-10-CM | POA: Diagnosis not present

## 2017-09-20 DIAGNOSIS — I509 Heart failure, unspecified: Secondary | ICD-10-CM | POA: Insufficient documentation

## 2017-09-20 DIAGNOSIS — I132 Hypertensive heart and chronic kidney disease with heart failure and with stage 5 chronic kidney disease, or end stage renal disease: Secondary | ICD-10-CM | POA: Insufficient documentation

## 2017-09-20 DIAGNOSIS — J9601 Acute respiratory failure with hypoxia: Secondary | ICD-10-CM | POA: Diagnosis not present

## 2017-09-20 DIAGNOSIS — N186 End stage renal disease: Secondary | ICD-10-CM | POA: Diagnosis not present

## 2017-09-20 DIAGNOSIS — J449 Chronic obstructive pulmonary disease, unspecified: Secondary | ICD-10-CM | POA: Diagnosis not present

## 2017-09-20 DIAGNOSIS — Z992 Dependence on renal dialysis: Secondary | ICD-10-CM | POA: Insufficient documentation

## 2017-09-20 DIAGNOSIS — Z951 Presence of aortocoronary bypass graft: Secondary | ICD-10-CM | POA: Insufficient documentation

## 2017-09-20 DIAGNOSIS — D638 Anemia in other chronic diseases classified elsewhere: Secondary | ICD-10-CM | POA: Diagnosis not present

## 2017-09-20 DIAGNOSIS — K7460 Unspecified cirrhosis of liver: Secondary | ICD-10-CM | POA: Insufficient documentation

## 2017-09-20 DIAGNOSIS — D631 Anemia in chronic kidney disease: Secondary | ICD-10-CM | POA: Diagnosis not present

## 2017-09-20 DIAGNOSIS — F17210 Nicotine dependence, cigarettes, uncomplicated: Secondary | ICD-10-CM | POA: Diagnosis not present

## 2017-09-20 DIAGNOSIS — Z79899 Other long term (current) drug therapy: Secondary | ICD-10-CM | POA: Diagnosis not present

## 2017-09-20 DIAGNOSIS — Z9889 Other specified postprocedural states: Secondary | ICD-10-CM | POA: Diagnosis not present

## 2017-09-20 DIAGNOSIS — Z841 Family history of disorders of kidney and ureter: Secondary | ICD-10-CM | POA: Insufficient documentation

## 2017-09-20 DIAGNOSIS — E43 Unspecified severe protein-calorie malnutrition: Secondary | ICD-10-CM | POA: Insufficient documentation

## 2017-09-20 DIAGNOSIS — E785 Hyperlipidemia, unspecified: Secondary | ICD-10-CM | POA: Diagnosis not present

## 2017-09-20 LAB — CBC
HEMATOCRIT: 31.6 % — AB (ref 40.0–52.0)
HEMOGLOBIN: 9.6 g/dL — AB (ref 13.0–18.0)
MCH: 23.4 pg — ABNORMAL LOW (ref 26.0–34.0)
MCHC: 30.4 g/dL — ABNORMAL LOW (ref 32.0–36.0)
MCV: 77 fL — ABNORMAL LOW (ref 80.0–100.0)
Platelets: 257 10*3/uL (ref 150–440)
RBC: 4.11 MIL/uL — AB (ref 4.40–5.90)
RDW: 20.1 % — ABNORMAL HIGH (ref 11.5–14.5)
WBC: 5.7 10*3/uL (ref 3.8–10.6)

## 2017-09-20 LAB — RENAL FUNCTION PANEL
ALBUMIN: 3.1 g/dL — AB (ref 3.5–5.0)
ANION GAP: 10 (ref 5–15)
BUN: 50 mg/dL — ABNORMAL HIGH (ref 6–20)
CO2: 25 mmol/L (ref 22–32)
Calcium: 8.7 mg/dL — ABNORMAL LOW (ref 8.9–10.3)
Chloride: 104 mmol/L (ref 98–111)
Creatinine, Ser: 8.76 mg/dL — ABNORMAL HIGH (ref 0.61–1.24)
GFR, EST AFRICAN AMERICAN: 7 mL/min — AB (ref >60)
GFR, EST NON AFRICAN AMERICAN: 6 mL/min — AB (ref >60)
Glucose, Bld: 77 mg/dL (ref 70–99)
PHOSPHORUS: 7 mg/dL — AB (ref 2.5–4.6)
POTASSIUM: 6.8 mmol/L — AB (ref 3.5–5.1)
Sodium: 139 mmol/L (ref 135–145)

## 2017-09-20 MED ORDER — SODIUM CHLORIDE 0.9 % IV SOLN: 100.0000 mL | INTRAVENOUS | Status: DC | PRN

## 2017-09-20 MED ORDER — PENTAFLUOROPROP-TETRAFLUOROETH EX AERO
1.0000 "application " | INHALATION_SPRAY | CUTANEOUS | Status: DC | PRN
  Filled 2017-09-20: qty 30

## 2017-09-20 MED ORDER — LIDOCAINE HCL (PF) 1 % IJ SOLN
5.0000 mL | INTRAMUSCULAR | Status: DC | PRN
  Filled 2017-09-20: qty 5

## 2017-09-20 MED ORDER — CHLORHEXIDINE GLUCONATE CLOTH 2 % EX PADS: 6.0000 | MEDICATED_PAD | Freq: Every day | CUTANEOUS | Status: DC

## 2017-09-20 MED ORDER — EPOETIN ALFA 4000 UNIT/ML IJ SOLN
4000.0000 [IU] | Freq: Once | INTRAMUSCULAR | Status: AC
  Administered 2017-09-20: 4000 [IU] via SUBCUTANEOUS
  Filled 2017-09-20: qty 1

## 2017-09-20 MED ORDER — LIDOCAINE-PRILOCAINE 2.5-2.5 % EX CREA
1.0000 "application " | TOPICAL_CREAM | CUTANEOUS | Status: DC | PRN
  Filled 2017-09-20: qty 5

## 2017-09-20 MED ORDER — DIPHENHYDRAMINE HCL 50 MG/ML IJ SOLN
25.0000 mg | Freq: Once | INTRAMUSCULAR | Status: AC
  Administered 2017-09-20: 25 mg via INTRAVENOUS

## 2017-09-20 MED ORDER — ALTEPLASE 2 MG IJ SOLR: 2.0000 mg | Freq: Once | INTRAMUSCULAR | Status: DC | PRN

## 2017-09-20 MED ORDER — HEPARIN SODIUM (PORCINE) 1000 UNIT/ML DIALYSIS: 1000.0000 [IU] | INTRAMUSCULAR | Status: DC | PRN

## 2017-09-20 NOTE — Progress Notes (Signed)
Lab called with a critical potassium level of 6.8, MD aware. Pt switched from 2K  to a 1K bath.    09/20/17 1330  Vital Signs  Pulse Rate 81  Pulse Rate Source Monitor  Resp 18  BP 124/74  BP Location Left Arm  BP Method Automatic  Patient Position (if appropriate) Lying  Oxygen Therapy  SpO2 97 %  O2 Device Room Air  Pre Treatment Patient Checks  Dialysate 1K  During Hemodialysis Assessment  Blood Flow Rate (mL/min) 400 mL/min  Arterial Pressure (mmHg) -180 mmHg  Venous Pressure (mmHg) 150 mmHg  Transmembrane Pressure (mmHg) 70 mmHg  Ultrafiltration Rate (mL/min) 1290 mL/min  Dialysate Flow Rate (mL/min) 800 ml/min  Conductivity: Machine  13.7  HD Safety Checks Performed Yes  Intra-Hemodialysis Comments Progressing as prescribed (pt sleeping, 1979)

## 2017-09-20 NOTE — Progress Notes (Signed)
Post HD assessment    09/20/17 1559  Neurological  Level of Consciousness Alert  Orientation Level Oriented X4  Respiratory  Respiratory Pattern Regular;Unlabored  Chest Assessment Chest expansion symmetrical  Cough Non-productive  Cardiac  Pulse Regular  ECG Monitor Yes  Vascular  R Radial Pulse +2  L Radial Pulse +2  Edema Generalized  Integumentary  Integumentary (WDL) X  Skin Color Appropriate for ethnicity  Skin Condition Itching  Musculoskeletal  Musculoskeletal (WDL) X  Generalized Weakness Yes  Assistive Device None  GU Assessment  Genitourinary (WDL) X  Genitourinary Symptoms  (HD)  Psychosocial  Psychosocial (WDL) WDL

## 2017-09-20 NOTE — Progress Notes (Signed)
Pre HD assessment   09/20/17 1153  Vital Signs  Temp 98.6 F (37 C)  Temp Source Oral  Pulse Rate 84  Pulse Rate Source Monitor  Resp 19  BP (!) 136/92  BP Location Left Arm  BP Method Automatic  Patient Position (if appropriate) Lying  Oxygen Therapy  SpO2 100 %  O2 Device Room Air  Pain Assessment  Pain Scale 0-10  Pain Score 0  Dialysis Weight  Weight 72.5 kg (159 lb 13.3 oz)  Type of Weight Pre-Dialysis  Time-Out for Hemodialysis  What Procedure? HD  Pt Identifiers(min of two) First/Last Name;MRN/Account#  Correct Site? Yes  Correct Side? Yes  Correct Procedure? Yes  Consents Verified? Yes  Rad Studies Available? N/A  Safety Precautions Reviewed? Yes  Engineer, civil (consulting) Number  (1A)  Station Number 3  UF/Alarm Test Passed  Conductivity: Meter 13.8  Conductivity: Machine  13.9  pH 7.4  Reverse Osmosis main  Normal Saline Lot Number [id number]  Dialyzer Lot Number [id number]  Disposable Set Lot Number [id number]  Machine Temperature 98.6 F (37 C)  Musician and Audible Yes  Blood Lines Intact and Secured Yes  Pre Treatment Patient Checks  Vascular access used during treatment Catheter  Patient is receiving dialysis in a chair Yes  Hepatitis B Surface Antigen Results Negative  Date Hepatitis B Surface Antigen Drawn 06/16/17  Hepatitis B Surface Antibody  (>10)  Date Hepatitis B Surface Antibody Drawn 06/16/17  Hemodialysis Consent Verified Yes  Hemodialysis Standing Orders Initiated Yes  ECG (Telemetry) Monitor On Yes  Prime Ordered Normal Saline  Length of  DialysisTreatment -hour(s) 3.5 Hour(s)  Dialyzer Elisio 17H NR  Dialysate 2K, 2.5 Ca  Dialysis Anticoagulant None  Dialysate Flow Ordered 800  Blood Flow Rate Ordered 400 mL/min  Ultrafiltration Goal 3.5 Liters  Pre Treatment Labs Renal panel;CBC  Dialysis Blood Pressure Support Ordered Normal Saline  Education / Care Plan  Dialysis Education Provided Yes  Documented Education in Care  Plan Yes  Hemodialysis Catheter Right Internal jugular Double-lumen  No Placement Date or Time found.   Placed prior to admission: Yes  Orientation: Right  Access Location: Internal jugular  Hemodialysis Catheter Type: Double-lumen  Site Condition No complications  Blue Lumen Status Heparin locked  Red Lumen Status Heparin locked  Purple Lumen Status N/A  Dressing Type Biopatch  Dressing Status Clean;Dry;Intact  Drainage Description None

## 2017-09-20 NOTE — ED Provider Notes (Signed)
-----------------------------------------   4:08 PM on 09/20/2017 -----------------------------------------  Patient has returned from dialysis, he has no complaints denies any shortness of breath.  Is asking to be discharged home.  We will discharge at this time.   Harvest Dark, MD 09/20/17 [phone]

## 2017-09-20 NOTE — ED Triage Notes (Signed)
Patient presents to ED via POV for dialysis treatment. No complaints at this time.

## 2017-09-20 NOTE — Progress Notes (Signed)
HD tx end    09/20/17 1542  Vital Signs  Pulse Rate 87  Pulse Rate Source Monitor  Resp 12  BP (!) 136/94  BP Location Left Arm  BP Method Automatic  Patient Position (if appropriate) Lying  Oxygen Therapy  SpO2 100 %  O2 Device Room Air  During Hemodialysis Assessment  Dialysis Fluid Bolus Normal Saline  Bolus Amount (mL) 250 mL  Intra-Hemodialysis Comments Tx completed

## 2017-09-20 NOTE — Progress Notes (Signed)
Pre HD assessment    09/20/17 1157  Neurological  Level of Consciousness Alert  Orientation Level Oriented X4  Respiratory  Respiratory Pattern Regular;Unlabored  Chest Assessment Chest expansion symmetrical  Cough Non-productive  Cardiac  Pulse Regular  ECG Monitor Yes  Vascular  R Radial Pulse +2  L Radial Pulse +2  Edema Generalized  Integumentary  Integumentary (WDL) X  Skin Color Appropriate for ethnicity  Skin Condition Itching  Musculoskeletal  Musculoskeletal (WDL) X  Generalized Weakness Yes  Assistive Device None  GU Assessment  Genitourinary (WDL) X  Genitourinary Symptoms  (HD)  Psychosocial  Psychosocial (WDL) WDL

## 2017-09-20 NOTE — Progress Notes (Signed)
HD tx start    09/20/17 1158  Vital Signs  Pulse Rate 84  Pulse Rate Source Monitor  Resp 17  BP (!) 133/93  BP Location Left Arm  BP Method Automatic  Patient Position (if appropriate) Lying  Oxygen Therapy  SpO2 100 %  O2 Device Room Air  During Hemodialysis Assessment  Blood Flow Rate (mL/min) 400 mL/min  Arterial Pressure (mmHg) -160 mmHg  Venous Pressure (mmHg) 150 mmHg  Transmembrane Pressure (mmHg) 70 mmHg  Ultrafiltration Rate (mL/min) 1290 mL/min  Dialysate Flow Rate (mL/min) 800 ml/min  Conductivity: Machine  13.9  HD Safety Checks Performed Yes  Dialysis Fluid Bolus Normal Saline  Bolus Amount (mL) 250 mL  Intra-Hemodialysis Comments Tx initiated  Hemodialysis Catheter Right Internal jugular Double-lumen  No Placement Date or Time found.   Placed prior to admission: Yes  Orientation: Right  Access Location: Internal jugular  Hemodialysis Catheter Type: Double-lumen  Blue Lumen Status Infusing  Red Lumen Status Infusing

## 2017-09-20 NOTE — ED Notes (Signed)
Pt in dialysis

## 2017-09-20 NOTE — ED Notes (Signed)
Pt left dialysis reporting he could no longer wait for transportation. RN stopped pt in the hall and MD was able to assess him. Pt in NAD at this time.

## 2017-09-20 NOTE — ED Provider Notes (Signed)
[facility] Emergency Department Provider Note  ____________________________________________  Time seen: Approximately 11:48 AM  I have reviewed the triage vital signs and the nursing notes.   HISTORY  Chief Complaint Dialysis Treatment    HPI Stanley [name] is a 57 y.o. male with a history of CHF, cirrhosis, end-stage renal disease on dialysis, hypertension who presents today for routine hemodialysis.  Denies any acute complaints.  No chest pain shortness of breath leg swelling orthopnea dizziness palpitations or syncope.  Last dialysis was 2 days ago.      Past Medical History:  Diagnosis Date  . Alcohol abuse   . CHF (congestive heart failure) ([place])   . Cirrhosis ([place])   . Coronary artery disease 2009  . Drug abuse ([place])   . End stage renal disease on dialysis [facility]) NEPHROLOGIST-   DR [facility]  IN [place]   HEMODIALYSIS --   TUES/  THURS/  SAT  . Gastrointestinal bleed 06/13/2017   From chart...hx of multiple GI bleeds  . GERD (gastroesophageal reflux disease)   . Hyperlipidemia   . Hypertension   . PAD (peripheral artery disease) ([place])   . Renal insufficiency    Per pt, 32 oz fluid restriction per day  . S/P triple vessel bypass 06/09/2016   2009ish  . Suicidal ideation    & HOMICIDAL IDEATION --  06-16-2013   ADMITTED TO BEHAVIOR HEALTH     Patient Active Problem List   Diagnosis Date Noted  . ESRD (end stage renal disease) on dialysis ([place]) 07/28/2017  . Protein-calorie malnutrition, severe 06/14/2017  . Encounter for dialysis [facility])   . Palliative care by specialist   . Goals of care, counseling/discussion   . Malnutrition of moderate degree 06/05/2017  . Secondary esophageal varices without bleeding ([place])   . Stomach irritation   . Idiopathic esophageal varices without bleeding ([place])   . Alcoholic hepatitis with ascites 05/24/2017  . ESRD (end stage renal disease) ([place]) 04/28/2017  . Uremia 03/08/2017  . ESRD on hemodialysis  ([place]) 03/03/2017  . Weakness 02/28/2017  . Hypocalcemia 02/22/2017  . Shortness of breath 11/26/2016  . COPD (chronic obstructive pulmonary disease) ([place]) 10/30/2016  . COPD exacerbation ([place]) 10/29/2016  . Anemia   . Heme positive stool   . Ulceration of intestine   . Benign neoplasm of transverse colon   . Acute gastrointestinal hemorrhage   . Esophageal candidiasis ([place])   . Angiodysplasia of intestinal tract   . Acute respiratory failure with hypoxia ([place]) 07/03/2016  . GI bleeding 06/24/2016  . Rectal bleeding 06/14/2016  . Anemia of chronic disease 06/01/2016  . MRSA carrier 06/01/2016  . Chronic renal failure 05/23/2016  . Ischemic heart disease 05/23/2016  . Angiodysplasia of small intestine   . Melena   . Small bowel bleed not requiring more than 4 units of blood in 24 hours, ICU, or surgery   . Anemia due to chronic blood loss   . Abdominal pain 05/05/2016  . Acute posthemorrhagic anemia 04/17/2016  . Gastrointestinal bleed 04/17/2016  . History of esophagogastroduodenoscopy (EGD) 04/17/2016  . Elevated troponin 04/17/2016  . Alcohol abuse 04/17/2016  . Upper GI bleed 01/19/2016  . Blood in stool   . Angiodysplasia of stomach and duodenum with hemorrhage   . Gastritis   . Reflux esophagitis   . GI bleed 05/16/2015  . Acute GI bleeding   . Symptomatic anemia 04/30/2015  . HTN (hypertension) 04/06/2015  . GERD (gastroesophageal reflux disease) 04/06/2015  . HLD (  hyperlipidemia) 04/06/2015  . Dyspnea 04/06/2015  . Cirrhosis of liver with ascites ([place]) 04/06/2015  . Ascites 04/06/2015  . GIB (gastrointestinal bleeding) 03/23/2015  . Homicidal ideation 06/19/2013  . Suicidal intent 06/19/2013  . Homicidal ideations 06/19/2013  . Hyperkalemia 06/16/2013  . Mandible fracture ([place]) 06/05/2013  . Fracture, mandible ([place]) 06/02/2013  . Coronary atherosclerosis of native coronary artery 06/02/2013  . ESRD on dialysis ([place]) 06/02/2013  . Mandible open fracture ([place])  06/02/2013     Past Surgical History:  Procedure Laterality Date  . A/V FISTULAGRAM Right 06/06/2017   Procedure: A/V FISTULAGRAM;  Surgeon: Katha Cabal, MD;  Location: [place] CV LAB;  Service: Cardiovascular;  Laterality: Right;  . A/V SHUNT INTERVENTION N/A 06/06/2017   Procedure: A/V SHUNT INTERVENTION;  Surgeon: Katha Cabal, MD;  Location: [place] CV LAB;  Service: Cardiovascular;  Laterality: N/A;  . AGILE CAPSULE N/A 06/19/2016   Procedure: AGILE CAPSULE;  Surgeon: [name], MD;  Location: [facility] ENDOSCOPY;  Service: Endoscopy;  Laterality: N/A;  . COLONOSCOPY WITH PROPOFOL N/A 06/18/2016   Procedure: COLONOSCOPY WITH PROPOFOL;  Surgeon: [name], MD;  Location: [facility] ENDOSCOPY;  Service: Endoscopy;  Laterality: N/A;  . COLONOSCOPY WITH PROPOFOL N/A 08/12/2016   Procedure: COLONOSCOPY WITH PROPOFOL;  Surgeon: [name], MD;  Location: [facility] ENDOSCOPY;  Service: Endoscopy;  Laterality: N/A;  . COLONOSCOPY WITH PROPOFOL N/A 05/05/2017   Procedure: COLONOSCOPY WITH PROPOFOL;  Surgeon: Manya Silvas, MD;  Location[facility] ENDOSCOPY;  Service: Endoscopy;  Laterality: N/A;  . CORONARY ANGIOPLASTY  ?   PT UNABLE TO TELL IF  BEFORE OR AFTER  CABG  . CORONARY ARTERY BYPASS GRAFT  2008  ([place])   3 VESSEL  . DIALYSIS FISTULA CREATION  LAST SURGERY  APPOX  2008  . ENTEROSCOPY N/A 05/10/2016   Procedure: ENTEROSCOPY;  Surgeon: Jerene Bears, MD;  Location: [place];  Service: Gastroenterology;  Laterality: N/A;  . ENTEROSCOPY N/A 08/12/2016   Procedure: ENTEROSCOPY;  Surgeon: [name], MD;  Location: [facility] ENDOSCOPY;  Service: Endoscopy;  Laterality: N/A;  . ENTEROSCOPY Left 06/03/2017   Procedure: ENTEROSCOPY;  Surgeon: Virgel Manifold, MD;  Location: [facility] ENDOSCOPY;  Service: Endoscopy;  Laterality: Left;  Procedure date will ultimately depend on when patient is medically optimized before the procedure, pending hemodialysis and blood transfusions etc.  Will place on schedule and change depending on clinical status.   . ENTEROSCOPY N/A 06/05/2017   Procedure: ENTEROSCOPY;  Surgeon: Virgel Manifold, MD;  Location: [facility] ENDOSCOPY;  Service: Endoscopy;  Laterality: N/A;  . ENTEROSCOPY N/A 06/15/2017   Procedure: Push ENTEROSCOPY;  Surgeon: [name], MD;  Location: [facility] ENDOSCOPY;  Service: Endoscopy;  Laterality: N/A;  . ESOPHAGOGASTRODUODENOSCOPY N/A 05/07/2015   Procedure: ESOPHAGOGASTRODUODENOSCOPY (EGD);  Surgeon: Hulen Luster, MD;  Location: [facility] ENDOSCOPY;  Service: Endoscopy;  Laterality: N/A;  . ESOPHAGOGASTRODUODENOSCOPY (EGD) WITH PROPOFOL N/A 05/17/2015   Procedure: ESOPHAGOGASTRODUODENOSCOPY (EGD) WITH PROPOFOL;  Surgeon: [name], MD;  Location: [facility] ENDOSCOPY;  Service: Endoscopy;  Laterality: N/A;  . ESOPHAGOGASTRODUODENOSCOPY (EGD) WITH PROPOFOL N/A 01/20/2016   Procedure: ESOPHAGOGASTRODUODENOSCOPY (EGD) WITH PROPOFOL;  Surgeon: [name], MD;  Location: [facility] ENDOSCOPY;  Service: Endoscopy;  Laterality: N/A;  . ESOPHAGOGASTRODUODENOSCOPY (EGD) WITH PROPOFOL N/A 04/17/2016   Procedure: ESOPHAGOGASTRODUODENOSCOPY (EGD) WITH PROPOFOL;  Surgeon: Lin Landsman, MD;  Location: [facility] ENDOSCOPY;  Service: Gastroenterology;  Laterality: N/A;  . ESOPHAGOGASTRODUODENOSCOPY (EGD) WITH PROPOFOL  05/09/2016   Procedure: ESOPHAGOGASTRODUODENOSCOPY (EGD) WITH PROPOFOL;  Surgeon:  Jerene Bears, MD;  Location: [place];  Service: Endoscopy;;  . ESOPHAGOGASTRODUODENOSCOPY (EGD) WITH PROPOFOL N/A 06/16/2016   Procedure: ESOPHAGOGASTRODUODENOSCOPY (EGD) WITH PROPOFOL;  Surgeon: [name], MD;  Location: [facility] ENDOSCOPY;  Service: Endoscopy;  Laterality: N/A;  . ESOPHAGOGASTRODUODENOSCOPY (EGD) WITH PROPOFOL N/A 05/05/2017   Procedure: ESOPHAGOGASTRODUODENOSCOPY (EGD) WITH PROPOFOL;  Surgeon: Manya Silvas, MD;  Location: [facility] ENDOSCOPY;  Service: Endoscopy;  Laterality: N/A;  . ESOPHAGOGASTRODUODENOSCOPY (EGD) WITH PROPOFOL N/A 06/15/2017    Procedure: ESOPHAGOGASTRODUODENOSCOPY (EGD) WITH PROPOFOL;  Surgeon: [name], MD;  Location: [facility] ENDOSCOPY;  Service: Endoscopy;  Laterality: N/A;  . GIVENS CAPSULE STUDY N/A 05/07/2016   Procedure: GIVENS CAPSULE STUDY;  Surgeon: Doran Stabler, MD;  Location: [place];  Service: Endoscopy;  Laterality: N/A;  . MANDIBULAR HARDWARE REMOVAL N/A 07/29/2013   Procedure: REMOVAL OF ARCH BARS;  Surgeon: Theodoro Kos, DO;  Location: [place];  Service: Plastics;  Laterality: N/A;  . ORIF MANDIBULAR FRACTURE N/A 06/05/2013   Procedure: REPAIR OF MANDIBULAR FRACTURE x 2 with maxillo-mandibular fixation ;  Surgeon: Theodoro Kos, DO;  Location: [place];  Service: Plastics;  Laterality: N/A;  . PERIPHERAL ARTERIAL STENT GRAFT Left      Prior to Admission medications   Medication Sig Start Date End Date Taking? Authorizing Provider  albuterol (PROVENTIL HFA;VENTOLIN HFA) 108 (90 Base) MCG/ACT inhaler Inhale 2 puffs into the lungs every 6 (six) hours as needed for wheezing or shortness of breath. 04/21/17   Lisa Roca, MD  alum & mag hydroxide-simeth (MAALOX/MYLANTA) 200-200-20 MG/5ML suspension Take 15 mLs every 4 (four) hours as needed by mouth for indigestion or heartburn. Patient not taking: Reported on 04/19/2017 01/25/17   Demetrios Loll, MD  atorvastatin (LIPITOR) 40 MG tablet Take 1 tablet (40 mg total) by mouth daily. 11/16/16   Vaughan Basta, MD  budesonide-formoterol (SYMBICORT) 160-4.5 MCG/ACT inhaler Inhale 2 puffs into the lungs daily. 11/16/16   Vaughan Basta, MD  calcium acetate (PHOSLO) 667 MG capsule Take 2,001 mg by mouth 3 (three) times daily.    [provider]  doxycycline (VIBRA-TABS) 100 MG tablet Take 1 tablet (100 mg total) by mouth 2 (two) times daily. Patient not taking: Reported on 08/28/2017 07/10/17   Harvest Dark, MD  ferrous sulfate 325 (65 FE) MG tablet Take 1 tablet by mouth 3 (three) times daily. 01/21/17   [provider]  furosemide (LASIX) 80 MG tablet Take 1 tablet (80 mg total) daily by mouth. 01/25/17   Demetrios Loll, MD  gabapentin (NEURONTIN) 300 MG capsule Take 1 capsule by mouth daily as directed 08/16/16   [provider]  ipratropium-albuterol (DUONEB) 0.5-2.5 (3) MG/3ML SOLN Take 3 mLs by nebulization every 6 (six) hours as needed. 11/28/16   Loletha Grayer, MD  labetalol (NORMODYNE) 100 MG tablet Take 100 mg by mouth daily. 03/24/17   [provider]  Multiple Vitamins-Minerals-FA (DIALYVITE SUPREME D) 3 MG TABS Take 1 tablet (3 mg total) by mouth daily. 11/28/16   Loletha Grayer, MD  nitroGLYCERIN (NITROSTAT) 0.4 MG SL tablet Place 1 tablet (0.4 mg total) under the tongue every 5 (five) minutes as needed. 04/12/16   Wende Bushy, MD  pantoprazole (PROTONIX) 40 MG tablet Take 1 tablet (40 mg total) by mouth daily. 11/28/16   Loletha Grayer, MD  Spacer/Aero Chamber Mouthpiece MISC 1 Units by Does not apply route every 4 (four) hours as needed (wheezing). 02/19/17   Darel Hong, MD  tiotropium (SPIRIVA HANDIHALER) 18 MCG inhalation  capsule Place 1 capsule (18 mcg total) into inhaler and inhale daily. 11/16/16 11/16/17  Vaughan Basta, MD     Allergies Patient has no known allergies.   Family History  Problem Relation Age of Onset  . Colon cancer Mother   . Cancer Father   . Cancer Sister   . Kidney disease Brother     Social History Social History   Tobacco Use  . Smoking status: Current Every Day Smoker    Packs/day: 0.15    Years: 40.00    Pack years: 6.00    Types: Cigarettes  . Smokeless tobacco: Never Used  Substance Use Topics  . Alcohol use: No    Comment: pt reports quitting after learning about cirrhosis  . Drug use: No    Frequency: 7.0 times per week    Types: Marijuana, Cocaine    Review of Systems  Constitutional:   No fever or chills.  ENT:   No sore throat. No rhinorrhea. Cardiovascular:   No chest pain or  syncope. Respiratory:   No dyspnea or cough. Gastrointestinal:   Negative for abdominal pain, vomiting and diarrhea.  Musculoskeletal:   Negative for focal pain or swelling All other systems reviewed and are negative except as documented above in ROS and HPI.  ____________________________________________   PHYSICAL EXAM:  VITAL SIGNS: ED Triage Vitals [09/20/17 1139]  Enc Vitals Group     BP 127/82     Pulse Rate 84     Resp 17     Temp 98.3 F (36.8 C)     Temp Source Oral     SpO2 97 %     Weight 160 lb (72.6 kg)     Height 6\' 3"  (1.905 m)     Head Circumference      Peak Flow      Pain Score 0     Pain Loc      Pain Edu?      Excl. in [place]?     Vital signs reviewed, nursing assessments reviewed.   Constitutional:   Alert and oriented. Non-toxic appearance. Eyes:   Conjunctivae are normal. EOMI.  ENT      Head:   Normocephalic and atraumatic.      Nose:   No congestion/rhinnorhea.       Mouth/Throat:   MMM, no pharyngeal erythema. No peritonsillar mass.       Neck:   No meningismus. Full ROM. Hematological/Lymphatic/Immunilogical:   No cervical lymphadenopathy. Cardiovascular:   RRR. Symmetric bilateral radial and DP pulses.  No murmurs.  Upper extremity graft dialysis pulsatile.   Respiratory:   Normal respiratory effort without tachypnea/retractions. Breath sounds are clear and equal bilaterally. No wheezes/rales/rhonchi. Gastrointestinal:   Soft and nontender. Non distended. There is no CVA tenderness.  No rebound, rigidity, or guarding.  Musculoskeletal:   Normal range of motion in all extremities. No joint effusions.  No lower extremity tenderness.  No edema. Neurologic:   Normal speech and language.  Motor grossly intact. No acute focal neurologic deficits are appreciated.   ____________________________________________    LABS (pertinent positives/negatives) (all labs ordered are listed, but only abnormal results are displayed) Labs Reviewed  CBC   RENAL FUNCTION PANEL   ____________________________________________   EKG    ____________________________________________    RADIOLOGY  No results found.  ____________________________________________   PROCEDURES Procedures  ____________________________________________    CLINICAL IMPRESSION / ASSESSMENT AND PLAN / ED COURSE  Pertinent labs & imaging results that were available during my care  of the patient were reviewed by me and considered in my medical decision making (see chart for details).    Patient well-appearing, nontoxic, normal vital signs.  No acute symptoms, presents for routine dialysis.  Dialysis center is aware and is ready for him and will complete treatment.  Anticipate discharge home afterward.      ____________________________________________   FINAL CLINICAL IMPRESSION(S) / ED DIAGNOSES    Final diagnoses:  ESRD on hemodialysis [facility])     ED Discharge Orders    None      Portions of this note were generated with dragon dictation software. Dictation errors may occur despite best attempts at proofreading.    Carrie Mew, MD 09/20/17 1154

## 2017-09-20 NOTE — Progress Notes (Signed)
Pt tolerated tx well without c/o or complication. Net UF 4051, goal met.    09/20/17 1547  Vital Signs  Temp 98.6 F (37 C)  Temp Source Oral  Pulse Rate 87  Pulse Rate Source Monitor  Resp (!) 21  BP 139/85  BP Location Left Arm  BP Method Automatic  Patient Position (if appropriate) Lying  Oxygen Therapy  SpO2 100 %  O2 Device Room Air  Dialysis Weight  Weight 69.1 kg (152 lb 5.4 oz)  Type of Weight Post-Dialysis  Post-Hemodialysis Assessment  Rinseback Volume (mL) 250 mL  KECN 74.8 V  Dialyzer Clearance Lightly streaked  Duration of HD Treatment -hour(s) 3.5 hour(s)  Hemodialysis Intake (mL) 500 mL  UF Total -Machine (mL) 4551 mL  Net UF (mL) 4051 mL  Tolerated HD Treatment Yes  Education / Care Plan  Dialysis Education Provided Yes  Documented Education in Care Plan Yes  Hemodialysis Catheter Right Internal jugular Double-lumen  No Placement Date or Time found.   Placed prior to admission: Yes  Orientation: Right  Access Location: Internal jugular  Hemodialysis Catheter Type: Double-lumen  Site Condition No complications  Blue Lumen Status Heparin locked  Red Lumen Status Heparin locked  Purple Lumen Status N/A  Catheter fill solution Heparin 1000 units/ml  Catheter fill volume (Arterial) 1.5 cc  Catheter fill volume (Venous) 1.5  Dressing Type Biopatch  Dressing Status Clean;Dry;Intact  Drainage Description None  Post treatment catheter status Capped and Clamped

## 2017-09-22 ENCOUNTER — Non-Acute Institutional Stay
Admission: EM | Admit: 2017-09-22 | Discharge: 2017-09-22 | Disposition: A | Discharge status: Home / Self Care | Payer: Medicare Other | Attending: Emergency Medicine | Admitting: Emergency Medicine

## 2017-09-22 ENCOUNTER — Encounter: Payer: Self-pay | Admitting: Emergency Medicine

## 2017-09-22 ENCOUNTER — Other Ambulatory Visit: Payer: Self-pay

## 2017-09-22 DIAGNOSIS — K633 Ulcer of intestine: Secondary | ICD-10-CM | POA: Insufficient documentation

## 2017-09-22 DIAGNOSIS — E43 Unspecified severe protein-calorie malnutrition: Secondary | ICD-10-CM | POA: Diagnosis not present

## 2017-09-22 DIAGNOSIS — K7031 Alcoholic cirrhosis of liver with ascites: Secondary | ICD-10-CM | POA: Insufficient documentation

## 2017-09-22 DIAGNOSIS — Z79899 Other long term (current) drug therapy: Secondary | ICD-10-CM | POA: Diagnosis not present

## 2017-09-22 DIAGNOSIS — D631 Anemia in chronic kidney disease: Secondary | ICD-10-CM | POA: Diagnosis not present

## 2017-09-22 DIAGNOSIS — E785 Hyperlipidemia, unspecified: Secondary | ICD-10-CM | POA: Diagnosis not present

## 2017-09-22 DIAGNOSIS — I132 Hypertensive heart and chronic kidney disease with heart failure and with stage 5 chronic kidney disease, or end stage renal disease: Secondary | ICD-10-CM | POA: Insufficient documentation

## 2017-09-22 DIAGNOSIS — B3781 Candidal esophagitis: Secondary | ICD-10-CM | POA: Diagnosis not present

## 2017-09-22 DIAGNOSIS — Z8614 Personal history of Methicillin resistant Staphylococcus aureus infection: Secondary | ICD-10-CM | POA: Diagnosis not present

## 2017-09-22 DIAGNOSIS — I8510 Secondary esophageal varices without bleeding: Secondary | ICD-10-CM | POA: Diagnosis not present

## 2017-09-22 DIAGNOSIS — E875 Hyperkalemia: Secondary | ICD-10-CM | POA: Diagnosis not present

## 2017-09-22 DIAGNOSIS — F17210 Nicotine dependence, cigarettes, uncomplicated: Secondary | ICD-10-CM | POA: Insufficient documentation

## 2017-09-22 DIAGNOSIS — Z992 Dependence on renal dialysis: Secondary | ICD-10-CM

## 2017-09-22 DIAGNOSIS — J449 Chronic obstructive pulmonary disease, unspecified: Secondary | ICD-10-CM | POA: Diagnosis not present

## 2017-09-22 DIAGNOSIS — D123 Benign neoplasm of transverse colon: Secondary | ICD-10-CM | POA: Insufficient documentation

## 2017-09-22 DIAGNOSIS — R748 Abnormal levels of other serum enzymes: Secondary | ICD-10-CM | POA: Insufficient documentation

## 2017-09-22 DIAGNOSIS — K210 Gastro-esophageal reflux disease with esophagitis: Secondary | ICD-10-CM | POA: Diagnosis not present

## 2017-09-22 DIAGNOSIS — N186 End stage renal disease: Secondary | ICD-10-CM | POA: Diagnosis not present

## 2017-09-22 DIAGNOSIS — Z951 Presence of aortocoronary bypass graft: Secondary | ICD-10-CM | POA: Diagnosis not present

## 2017-09-22 DIAGNOSIS — I2510 Atherosclerotic heart disease of native coronary artery without angina pectoris: Secondary | ICD-10-CM | POA: Diagnosis not present

## 2017-09-22 DIAGNOSIS — I739 Peripheral vascular disease, unspecified: Secondary | ICD-10-CM | POA: Diagnosis not present

## 2017-09-22 DIAGNOSIS — Z7951 Long term (current) use of inhaled steroids: Secondary | ICD-10-CM | POA: Insufficient documentation

## 2017-09-22 DIAGNOSIS — I509 Heart failure, unspecified: Secondary | ICD-10-CM | POA: Insufficient documentation

## 2017-09-22 DIAGNOSIS — K31811 Angiodysplasia of stomach and duodenum with bleeding: Secondary | ICD-10-CM | POA: Diagnosis not present

## 2017-09-22 DIAGNOSIS — Z841 Family history of disorders of kidney and ureter: Secondary | ICD-10-CM | POA: Insufficient documentation

## 2017-09-22 DIAGNOSIS — Z809 Family history of malignant neoplasm, unspecified: Secondary | ICD-10-CM | POA: Insufficient documentation

## 2017-09-22 DIAGNOSIS — K2970 Gastritis, unspecified, without bleeding: Secondary | ICD-10-CM | POA: Insufficient documentation

## 2017-09-22 DIAGNOSIS — Z800 Family history of malignant neoplasm of digestive organs: Secondary | ICD-10-CM | POA: Insufficient documentation

## 2017-09-22 DIAGNOSIS — I120 Hypertensive chronic kidney disease with stage 5 chronic kidney disease or end stage renal disease: Secondary | ICD-10-CM | POA: Diagnosis not present

## 2017-09-22 LAB — CBC
HCT: 32 % — ABNORMAL LOW (ref 40.0–52.0)
Hemoglobin: 9.5 g/dL — ABNORMAL LOW (ref 13.0–18.0)
MCH: 23.4 pg — ABNORMAL LOW (ref 26.0–34.0)
MCHC: 29.6 g/dL — AB (ref 32.0–36.0)
MCV: 79.2 fL — AB (ref 80.0–100.0)
Platelets: 241 10*3/uL (ref 150–440)
RBC: 4.04 MIL/uL — ABNORMAL LOW (ref 4.40–5.90)
RDW: 20.2 % — ABNORMAL HIGH (ref 11.5–14.5)
WBC: 6.8 10*3/uL (ref 3.8–10.6)

## 2017-09-22 LAB — RENAL FUNCTION PANEL
Albumin: 3 g/dL — ABNORMAL LOW (ref 3.5–5.0)
Anion gap: 10 (ref 5–15)
BUN: 46 mg/dL — ABNORMAL HIGH (ref 6–20)
CALCIUM: 9.2 mg/dL (ref 8.9–10.3)
CO2: 24 mmol/L (ref 22–32)
CREATININE: 7.99 mg/dL — AB (ref 0.61–1.24)
Chloride: 104 mmol/L (ref 98–111)
GFR calc non Af Amer: 7 mL/min — ABNORMAL LOW (ref >60)
GFR, EST AFRICAN AMERICAN: 8 mL/min — AB (ref >60)
Glucose, Bld: 131 mg/dL — ABNORMAL HIGH (ref 70–99)
Phosphorus: 4.4 mg/dL (ref 2.5–4.6)
Potassium: 6.7 mmol/L (ref 3.5–5.1)
SODIUM: 138 mmol/L (ref 135–145)

## 2017-09-22 MED ORDER — LIDOCAINE-PRILOCAINE 2.5-2.5 % EX CREA
1.0000 "application " | TOPICAL_CREAM | CUTANEOUS | Status: DC | PRN
  Filled 2017-09-22: qty 5

## 2017-09-22 MED ORDER — HEPARIN SODIUM (PORCINE) 1000 UNIT/ML DIALYSIS
1000.0000 [IU] | INTRAMUSCULAR | Status: DC | PRN
  Filled 2017-09-22: qty 1

## 2017-09-22 MED ORDER — DIPHENHYDRAMINE HCL 50 MG/ML IJ SOLN
25.0000 mg | Freq: Once | INTRAMUSCULAR | Status: AC
  Administered 2017-09-22: 25 mg via INTRAVENOUS

## 2017-09-22 MED ORDER — CHLORHEXIDINE GLUCONATE CLOTH 2 % EX PADS
6.0000 | MEDICATED_PAD | Freq: Every day | CUTANEOUS | Status: DC
  Filled 2017-09-22: qty 6

## 2017-09-22 MED ORDER — ALTEPLASE 2 MG IJ SOLR
2.0000 mg | Freq: Once | INTRAMUSCULAR | Status: DC | PRN
  Filled 2017-09-22: qty 2

## 2017-09-22 MED ORDER — LIDOCAINE HCL (PF) 1 % IJ SOLN
5.0000 mL | INTRAMUSCULAR | Status: DC | PRN
  Filled 2017-09-22: qty 5

## 2017-09-22 MED ORDER — EPOETIN ALFA 10000 UNIT/ML IJ SOLN
4000.0000 [IU] | Freq: Once | INTRAMUSCULAR | Status: AC
  Administered 2017-09-22: 4000 [IU] via SUBCUTANEOUS

## 2017-09-22 MED ORDER — PENTAFLUOROPROP-TETRAFLUOROETH EX AERO: 1.0000 "application " | INHALATION_SPRAY | CUTANEOUS | Status: DC | PRN

## 2017-09-22 MED ORDER — SODIUM CHLORIDE 0.9 % IV SOLN: 100.0000 mL | INTRAVENOUS | Status: DC | PRN

## 2017-09-22 NOTE — Progress Notes (Signed)
Post HD assessment. Pt tolerated tx well without c/o or complication. 40 minutes remaining in tx time, pt requested to be taken off early. Net UF 2824, goal not met.    09/22/17 1531  Neurological  Level of Consciousness Alert  Orientation Level Oriented X4  Respiratory  Respiratory Pattern Regular;Unlabored  Chest Assessment Chest expansion symmetrical  Cough Non-productive  Cardiac  ECG Monitor Yes  Vascular  R Radial Pulse +2  L Radial Pulse +2  Integumentary  Integumentary (WDL) X  Skin Color Appropriate for ethnicity  Skin Condition Itching  Musculoskeletal  Musculoskeletal (WDL) X  Generalized Weakness Yes  Assistive Device None  GU Assessment  Genitourinary (WDL) X  Genitourinary Symptoms  (HD)  Psychosocial  Psychosocial (WDL) WDL

## 2017-09-22 NOTE — Progress Notes (Signed)
HD tx start    09/22/17 1238  Vital Signs  Pulse Rate 78  Pulse Rate Source Monitor  Resp (!) 24  BP 126/82  BP Location Left Arm  BP Method Automatic  Patient Position (if appropriate) Lying  Oxygen Therapy  SpO2 98 %  O2 Device Room Air  During Hemodialysis Assessment  Blood Flow Rate (mL/min) 400 mL/min  Arterial Pressure (mmHg) -160 mmHg  Venous Pressure (mmHg) 160 mmHg  Transmembrane Pressure (mmHg) 90 mmHg  Ultrafiltration Rate (mL/min) 1290 mL/min  Dialysate Flow Rate (mL/min) 800 ml/min  Conductivity: Machine  14  HD Safety Checks Performed Yes  Dialysis Fluid Bolus Normal Saline  Bolus Amount (mL) 250 mL  Intra-Hemodialysis Comments Tx initiated  Hemodialysis Catheter Right Internal jugular Double-lumen  No Placement Date or Time found.   Placed prior to admission: Yes  Orientation: Right  Access Location: Internal jugular  Hemodialysis Catheter Type: Double-lumen  Blue Lumen Status Infusing  Red Lumen Status Infusing

## 2017-09-22 NOTE — ED Notes (Signed)
Pt presents today for scheduled dialysis. Pt denies any complaints at this time, given a ginger ale per his request. Pt is alert and oriented, skin warm, dry, and intact. Will continue to monitor for further patient needs.

## 2017-09-22 NOTE — Progress Notes (Signed)
Pre HD assessment    09/22/17 1232  Neurological  Level of Consciousness Alert  Orientation Level Oriented X4  Respiratory  Respiratory Pattern Regular;Unlabored  Chest Assessment Chest expansion symmetrical  Cough Non-productive  Cardiac  ECG Monitor Yes  Vascular  R Radial Pulse +2  L Radial Pulse +2  Integumentary  Integumentary (WDL) X  Skin Color Appropriate for ethnicity  Skin Condition Itching  Musculoskeletal  Musculoskeletal (WDL) X  Generalized Weakness Yes  Assistive Device None  GU Assessment  Genitourinary (WDL) X  Genitourinary Symptoms  (HD)  Psychosocial  Psychosocial (WDL) WDL

## 2017-09-22 NOTE — ED Triage Notes (Signed)
Pt here for dialysis treatment. Pt on MWF schedule, has not missed any treatments, denies any complaints currently.

## 2017-09-22 NOTE — Progress Notes (Signed)
Post HD assessment. Pt tolerated tx well without c/o or complication. Net UF 6962, goal not met. Pt requested t be taken off early, 40 minutes remaining in tx time,MD aware.    09/22/17 1533  Vital Signs  Temp 98.1 F (36.7 C)  Temp Source Oral  Pulse Rate 84  Pulse Rate Source Monitor  Resp 17  BP 122/77  BP Location Left Arm  BP Method Automatic  Patient Position (if appropriate) Lying  Oxygen Therapy  SpO2 95 %  O2 Device Room Air  Dialysis Weight  Weight 69 kg (152 lb 1.9 oz)  Type of Weight Post-Dialysis  Post-Hemodialysis Assessment  Rinseback Volume (mL) 250 mL  KECN 63.5 V  Dialyzer Clearance Lightly streaked  Duration of HD Treatment -hour(s) 3 hour(s)  Hemodialysis Intake (mL) 500 mL  UF Total -Machine (mL) 3324 mL  Net UF (mL) 2824 mL  Tolerated HD Treatment Yes  Education / Care Plan  Dialysis Education Provided Yes  Documented Education in Care Plan Yes  Hemodialysis Catheter Right Internal jugular Double-lumen  No Placement Date or Time found.   Placed prior to admission: Yes  Orientation: Right  Access Location: Internal jugular  Hemodialysis Catheter Type: Double-lumen  Site Condition No complications  Blue Lumen Status Heparin locked  Red Lumen Status Heparin locked  Purple Lumen Status N/A  Catheter fill solution Heparin 1000 units/ml  Catheter fill volume (Arterial) 1.5 cc  Catheter fill volume (Venous) 1.5  Dressing Type Biopatch  Dressing Status Dressing changed  Interventions New dressing  Drainage Description None  Dressing Change Due 09/29/17  Post treatment catheter status Capped and Clamped

## 2017-09-22 NOTE — Progress Notes (Signed)
HD tx end. Pt requested to be taken off of tx early.    09/22/17 1530  Vital Signs  Pulse Rate 79  Pulse Rate Source Monitor  Resp 15  BP 127/74  BP Location Left Arm  BP Method Automatic  Patient Position (if appropriate) Lying  Oxygen Therapy  SpO2 100 %  O2 Device Room Air  During Hemodialysis Assessment  Dialysis Fluid Bolus Normal Saline  Bolus Amount (mL) 250 mL  Intra-Hemodialysis Comments Tx completed

## 2017-09-22 NOTE — Progress Notes (Signed)
Lab called with a critical potassium level of 6.7, pt changed to a 1K bath,MD aware.    09/22/17 1345  Vital Signs  Pulse Rate 77  Pulse Rate Source Monitor  Resp 16  BP 111/66  BP Location Left Arm  BP Method Automatic  Patient Position (if appropriate) Lying  Oxygen Therapy  SpO2 100 %  O2 Device Room Air  Pre Treatment Patient Checks  Dialysate 1K (critical potassium of 6.7, MD aware)  During Hemodialysis Assessment  Blood Flow Rate (mL/min) 400 mL/min  Arterial Pressure (mmHg) -190 mmHg  Venous Pressure (mmHg) 160 mmHg  Transmembrane Pressure (mmHg) 80 mmHg  Ultrafiltration Rate (mL/min) 1290 mL/min  Dialysate Flow Rate (mL/min) 800 ml/min  Conductivity: Machine  13.8  HD Safety Checks Performed Yes  Intra-Hemodialysis Comments Progressing as prescribed (1465)

## 2017-09-22 NOTE — Progress Notes (Signed)
Pre HD assessment   09/22/17 1231  Vital Signs  Temp 98.6 F (37 C)  Temp Source Oral  Pulse Rate 79  Pulse Rate Source Monitor  Resp 17  BP 117/66  BP Location Left Arm  BP Method Automatic  Patient Position (if appropriate) Lying  Oxygen Therapy  SpO2 100 %  O2 Device Room Air  Pain Assessment  Pain Scale 0-10  Pain Score 0  Dialysis Weight  Weight 69 kg (152 lb 1.9 oz)  Type of Weight Pre-Dialysis  Time-Out for Hemodialysis  What Procedure? HD  Pt Identifiers(min of two) First/Last Name;MRN/Account#  Correct Site? Yes  Correct Side? Yes  Correct Procedure? Yes  Consents Verified? Yes  Rad Studies Available? N/A  Safety Precautions Reviewed? Yes  Engineer, civil (consulting) Number  ([id number])  Station Number 1  UF/Alarm Test Passed  Conductivity: Meter 13.8  Conductivity: Machine  14  pH 7.4  Reverse Osmosis main  Normal Saline Lot Number [id number]  Dialyzer Lot Number [id number]  Disposable Set Lot Number [id number]  Machine Temperature 98.6 F (37 C)  Musician and Audible Yes  Blood Lines Intact and Secured Yes  Pre Treatment Patient Checks  Vascular access used during treatment Catheter  Patient is receiving dialysis in a chair Yes  Hepatitis B Surface Antigen Results Negative  Date Hepatitis B Surface Antigen Drawn 06/16/17  Hepatitis B Surface Antibody  (>10)  Date Hepatitis B Surface Antibody Drawn 06/16/17  Hemodialysis Consent Verified Yes  Hemodialysis Standing Orders Initiated Yes  ECG (Telemetry) Monitor On Yes  Prime Ordered Normal Saline  Length of  DialysisTreatment -hour(s) 3.5 Hour(s)  Dialyzer Elisio 17H NR  Dialysate 2K, 2.5 Ca  Dialysis Anticoagulant None  Dialysate Flow Ordered 800  Blood Flow Rate Ordered 400 mL/min  Ultrafiltration Goal 3.5 Liters  Pre Treatment Labs Renal panel;CBC  Dialysis Blood Pressure Support Ordered Normal Saline  Education / Care Plan  Dialysis Education Provided Yes  Documented Education in Care Plan Yes   Hemodialysis Catheter Right Internal jugular Double-lumen  No Placement Date or Time found.   Placed prior to admission: Yes  Orientation: Right  Access Location: Internal jugular  Hemodialysis Catheter Type: Double-lumen  Site Condition No complications  Blue Lumen Status Heparin locked  Red Lumen Status Heparin locked  Purple Lumen Status N/A  Dressing Type Biopatch  Dressing Status Clean;Dry;Intact  Drainage Description None

## 2017-09-22 NOTE — ED Provider Notes (Signed)
[facility] Emergency Department Provider Note  ____________________________________________  Time seen: Approximately 11:54 AM  I have reviewed the triage vital signs and the nursing notes.   HISTORY  Chief Complaint dialysis treatment   HPI Stanley [name] is a 57 y.o. male with h/o ESRD on HD (MWF) who presents for his scheduled dialysis treatment.  Patient's last dialysis was 2 days ago.  He denies chest pain or shortness of breath.  Past Medical History:  Diagnosis Date  . Alcohol abuse   . CHF (congestive heart failure) ([place])   . Cirrhosis ([place])   . Coronary artery disease 2009  . Drug abuse ([place])   . End stage renal disease on dialysis [facility]) NEPHROLOGIST-   DR [facility]  IN [place]   HEMODIALYSIS --   TUES/  THURS/  SAT  . Gastrointestinal bleed 06/13/2017   From chart...hx of multiple GI bleeds  . GERD (gastroesophageal reflux disease)   . Hyperlipidemia   . Hypertension   . PAD (peripheral artery disease) ([place])   . Renal insufficiency    Per pt, 32 oz fluid restriction per day  . S/P triple vessel bypass 06/09/2016   2009ish  . Suicidal ideation    & HOMICIDAL IDEATION --  06-16-2013   ADMITTED TO BEHAVIOR HEALTH    Patient Active Problem List   Diagnosis Date Noted  . ESRD (end stage renal disease) on dialysis ([place]) 07/28/2017  . Protein-calorie malnutrition, severe 06/14/2017  . Encounter for dialysis [facility])   . Palliative care by specialist   . Goals of care, counseling/discussion   . Malnutrition of moderate degree 06/05/2017  . Secondary esophageal varices without bleeding ([place])   . Stomach irritation   . Idiopathic esophageal varices without bleeding ([place])   . Alcoholic hepatitis with ascites 05/24/2017  . ESRD (end stage renal disease) ([place]) 04/28/2017  . Uremia 03/08/2017  . ESRD on hemodialysis ([place]) 03/03/2017  . Weakness 02/28/2017  . Hypocalcemia 02/22/2017  . Shortness of breath 11/26/2016  . COPD (chronic  obstructive pulmonary disease) ([place]) 10/30/2016  . COPD exacerbation ([place]) 10/29/2016  . Anemia   . Heme positive stool   . Ulceration of intestine   . Benign neoplasm of transverse colon   . Acute gastrointestinal hemorrhage   . Esophageal candidiasis ([place])   . Angiodysplasia of intestinal tract   . Acute respiratory failure with hypoxia ([place]) 07/03/2016  . GI bleeding 06/24/2016  . Rectal bleeding 06/14/2016  . Anemia of chronic disease 06/01/2016  . MRSA carrier 06/01/2016  . Chronic renal failure 05/23/2016  . Ischemic heart disease 05/23/2016  . Angiodysplasia of small intestine   . Melena   . Small bowel bleed not requiring more than 4 units of blood in 24 hours, ICU, or surgery   . Anemia due to chronic blood loss   . Abdominal pain 05/05/2016  . Acute posthemorrhagic anemia 04/17/2016  . Gastrointestinal bleed 04/17/2016  . History of esophagogastroduodenoscopy (EGD) 04/17/2016  . Elevated troponin 04/17/2016  . Alcohol abuse 04/17/2016  . Upper GI bleed 01/19/2016  . Blood in stool   . Angiodysplasia of stomach and duodenum with hemorrhage   . Gastritis   . Reflux esophagitis   . GI bleed 05/16/2015  . Acute GI bleeding   . Symptomatic anemia 04/30/2015  . HTN (hypertension) 04/06/2015  . GERD (gastroesophageal reflux disease) 04/06/2015  . HLD (hyperlipidemia) 04/06/2015  . Dyspnea 04/06/2015  . Cirrhosis of liver with ascites ([place]) 04/06/2015  . Ascites 04/06/2015  .  GIB (gastrointestinal bleeding) 03/23/2015  . Homicidal ideation 06/19/2013  . Suicidal intent 06/19/2013  . Homicidal ideations 06/19/2013  . Hyperkalemia 06/16/2013  . Mandible fracture ([place]) 06/05/2013  . Fracture, mandible ([place]) 06/02/2013  . Coronary atherosclerosis of native coronary artery 06/02/2013  . ESRD on dialysis ([place]) 06/02/2013  . Mandible open fracture ([place]) 06/02/2013    Past Surgical History:  Procedure Laterality Date  . A/V FISTULAGRAM Right 06/06/2017   Procedure: A/V  FISTULAGRAM;  Surgeon: Katha Cabal, MD;  Location: [place] [facility];  Service: Cardiovascular;  Laterality: Right;  . A/V SHUNT INTERVENTION N/A 06/06/2017   Procedure: A/V SHUNT INTERVENTION;  Surgeon: Katha Cabal, MD;  Location: [place] [facility];  Service: Cardiovascular;  Laterality: N/A;  . AGILE CAPSULE N/A 06/19/2016   Procedure: AGILE CAPSULE;  Surgeon: [name], MD;  Location: [facility] ENDOSCOPY;  Service: Endoscopy;  Laterality: N/A;  . COLONOSCOPY WITH PROPOFOL N/A 06/18/2016   Procedure: COLONOSCOPY WITH PROPOFOL;  Surgeon: [name], MD;  Location: [facility] ENDOSCOPY;  Service: Endoscopy;  Laterality: N/A;  . COLONOSCOPY WITH PROPOFOL N/A 08/12/2016   Procedure: COLONOSCOPY WITH PROPOFOL;  Surgeon: [name], MD;  Location: [facility] ENDOSCOPY;  Service: Endoscopy;  Laterality: N/A;  . COLONOSCOPY WITH PROPOFOL N/A 05/05/2017   Procedure: COLONOSCOPY WITH PROPOFOL;  Surgeon: Manya Silvas, MD;  Location: [facility] ENDOSCOPY;  Service: Endoscopy;  Laterality: N/A;  . CORONARY ANGIOPLASTY  ?   PT UNABLE TO TELL IF  BEFORE OR AFTER  CABG  . CORONARY ARTERY BYPASS GRAFT  2008  ([place])   3 VESSEL  . DIALYSIS FISTULA CREATION  LAST SURGERY  APPOX  2008  . ENTEROSCOPY N/A 05/10/2016   Procedure: ENTEROSCOPY;  Surgeon: Jerene Bears, MD;  Location: [place];  Service: Gastroenterology;  Laterality: N/A;  . ENTEROSCOPY N/A 08/12/2016   Procedure: ENTEROSCOPY;  Surgeon: [name], MD;  Location: [facility] ENDOSCOPY;  Service: Endoscopy;  Laterality: N/A;  . ENTEROSCOPY Left 06/03/2017   Procedure: ENTEROSCOPY;  Surgeon: Virgel Manifold, MD;  Location: [facility] ENDOSCOPY;  Service: Endoscopy;  Laterality: Left;  Procedure date will ultimately depend on when patient is medically optimized before the procedure, pending hemodialysis and blood transfusions etc. Will place on schedule and change depending on clinical status.   . ENTEROSCOPY N/A 06/05/2017   Procedure: ENTEROSCOPY;   Surgeon: Virgel Manifold, MD;  Location: [facility] ENDOSCOPY;  Service: Endoscopy;  Laterality: N/A;  . ENTEROSCOPY N/A 06/15/2017   Procedure: Push ENTEROSCOPY;  Surgeon: [name], MD;  Location: [facility] ENDOSCOPY;  Service: Endoscopy;  Laterality: N/A;  . ESOPHAGOGASTRODUODENOSCOPY N/A 05/07/2015   Procedure: ESOPHAGOGASTRODUODENOSCOPY (EGD);  Surgeon: Hulen Luster, MD;  Location: [facility] ENDOSCOPY;  Service: Endoscopy;  Laterality: N/A;  . ESOPHAGOGASTRODUODENOSCOPY (EGD) WITH PROPOFOL N/A 05/17/2015   Procedure: ESOPHAGOGASTRODUODENOSCOPY (EGD) WITH PROPOFOL;  Surgeon: [name], MD;  Location: [facility] ENDOSCOPY;  Service: Endoscopy;  Laterality: N/A;  . ESOPHAGOGASTRODUODENOSCOPY (EGD) WITH PROPOFOL N/A 01/20/2016   Procedure: ESOPHAGOGASTRODUODENOSCOPY (EGD) WITH PROPOFOL;  Surgeon: [name], MD;  Location: [facility] ENDOSCOPY;  Service: Endoscopy;  Laterality: N/A;  . ESOPHAGOGASTRODUODENOSCOPY (EGD) WITH PROPOFOL N/A 04/17/2016   Procedure: ESOPHAGOGASTRODUODENOSCOPY (EGD) WITH PROPOFOL;  Surgeon: Lin Landsman, MD;  Location: [facility] ENDOSCOPY;  Service: Gastroenterology;  Laterality: N/A;  . ESOPHAGOGASTRODUODENOSCOPY (EGD) WITH PROPOFOL  05/09/2016   Procedure: ESOPHAGOGASTRODUODENOSCOPY (EGD) WITH PROPOFOL;  Surgeon: Jerene Bears, MD;  Location: [facility] ENDOSCOPY;  Service: Endoscopy;;  . ESOPHAGOGASTRODUODENOSCOPY (EGD) WITH PROPOFOL N/A 06/16/2016   Procedure:  ESOPHAGOGASTRODUODENOSCOPY (EGD) WITH PROPOFOL;  Surgeon: [name], MD;  Location: [facility] ENDOSCOPY;  Service: Endoscopy;  Laterality: N/A;  . ESOPHAGOGASTRODUODENOSCOPY (EGD) WITH PROPOFOL N/A 05/05/2017   Procedure: ESOPHAGOGASTRODUODENOSCOPY (EGD) WITH PROPOFOL;  Surgeon: Manya Silvas, MD;  Location: [facility] ENDOSCOPY;  Service: Endoscopy;  Laterality: N/A;  . ESOPHAGOGASTRODUODENOSCOPY (EGD) WITH PROPOFOL N/A 06/15/2017   Procedure: ESOPHAGOGASTRODUODENOSCOPY (EGD) WITH PROPOFOL;  Surgeon: [name], MD;  Location: [facility] ENDOSCOPY;  Service:  Endoscopy;  Laterality: N/A;  . GIVENS CAPSULE STUDY N/A 05/07/2016   Procedure: GIVENS CAPSULE STUDY;  Surgeon: Doran Stabler, MD;  Location: [place];  Service: Endoscopy;  Laterality: N/A;  . MANDIBULAR HARDWARE REMOVAL N/A 07/29/2013   Procedure: REMOVAL OF ARCH BARS;  Surgeon: Theodoro Kos, DO;  Location: [place];  Service: Plastics;  Laterality: N/A;  . ORIF MANDIBULAR FRACTURE N/A 06/05/2013   Procedure: REPAIR OF MANDIBULAR FRACTURE x 2 with maxillo-mandibular fixation ;  Surgeon: Theodoro Kos, DO;  Location: [place];  Service: Plastics;  Laterality: N/A;  . PERIPHERAL ARTERIAL STENT GRAFT Left     Prior to Admission medications   Medication Sig Start Date End Date Taking? Authorizing Provider  albuterol (PROVENTIL HFA;VENTOLIN HFA) 108 (90 Base) MCG/ACT inhaler Inhale 2 puffs into the lungs every 6 (six) hours as needed for wheezing or shortness of breath. 04/21/17   Lisa Roca, MD  alum & mag hydroxide-simeth (MAALOX/MYLANTA) 200-200-20 MG/5ML suspension Take 15 mLs every 4 (four) hours as needed by mouth for indigestion or heartburn. Patient not taking: Reported on 04/19/2017 01/25/17   Demetrios Loll, MD  atorvastatin (LIPITOR) 40 MG tablet Take 1 tablet (40 mg total) by mouth daily. 11/16/16   Vaughan Basta, MD  budesonide-formoterol (SYMBICORT) 160-4.5 MCG/ACT inhaler Inhale 2 puffs into the lungs daily. 11/16/16   Vaughan Basta, MD  calcium acetate (PHOSLO) 667 MG capsule Take 2,001 mg by mouth 3 (three) times daily.    [provider]  doxycycline (VIBRA-TABS) 100 MG tablet Take 1 tablet (100 mg total) by mouth 2 (two) times daily. Patient not taking: Reported on 08/28/2017 07/10/17   Harvest Dark, MD  ferrous sulfate 325 (65 FE) MG tablet Take 1 tablet by mouth 3 (three) times daily. 01/21/17   [provider]  furosemide (LASIX) 80 MG tablet Take 1 tablet (80 mg total) daily by mouth. 01/25/17   Demetrios Loll, MD  gabapentin  (NEURONTIN) 300 MG capsule Take 1 capsule by mouth daily as directed 08/16/16   [provider]  ipratropium-albuterol (DUONEB) 0.5-2.5 (3) MG/3ML SOLN Take 3 mLs by nebulization every 6 (six) hours as needed. 11/28/16   Loletha Grayer, MD  labetalol (NORMODYNE) 100 MG tablet Take 100 mg by mouth daily. 03/24/17   [provider]  Multiple Vitamins-Minerals-FA (DIALYVITE SUPREME D) 3 MG TABS Take 1 tablet (3 mg total) by mouth daily. 11/28/16   Loletha Grayer, MD  nitroGLYCERIN (NITROSTAT) 0.4 MG SL tablet Place 1 tablet (0.4 mg total) under the tongue every 5 (five) minutes as needed. 04/12/16   Wende Bushy, MD  pantoprazole (PROTONIX) 40 MG tablet Take 1 tablet (40 mg total) by mouth daily. 11/28/16   Loletha Grayer, MD  Spacer/Aero Chamber Mouthpiece MISC 1 Units by Does not apply route every 4 (four) hours as needed (wheezing). 02/19/17   Darel Hong, MD  tiotropium (SPIRIVA HANDIHALER) 18 MCG inhalation capsule Place 1 capsule (18 mcg total) into inhaler and inhale daily. 11/16/16 11/16/17  Vaughan Basta, MD    Allergies Patient  has no known allergies.  Family History  Problem Relation Age of Onset  . Colon cancer Mother   . Cancer Father   . Cancer Sister   . Kidney disease Brother     Social History Social History   Tobacco Use  . Smoking status: Current Every Day Smoker    Packs/day: 0.15    Years: 40.00    Pack years: 6.00    Types: Cigarettes  . Smokeless tobacco: Never Used  Substance Use Topics  . Alcohol use: No    Comment: pt reports quitting after learning about cirrhosis  . Drug use: No    Frequency: 7.0 times per week    Types: Marijuana, Cocaine    Review of Systems  Constitutional: Negative for fever. Cardiovascular: Negative for chest pain. Respiratory: Negative for shortness of breath. Gastrointestinal: Negative for abdominal pain, vomiting or diarrhea. Genitourinary: Negative for dysuria. Musculoskeletal: Negative for back  pain. Skin: Negative for rash. Neurological: Negative for headaches, weakness or numbness. Psych: No SI or HI  ____________________________________________   PHYSICAL EXAM:  VITAL SIGNS: ED Triage Vitals  Enc Vitals Group     BP 09/22/17 1147 125/84     Pulse Rate 09/22/17 1147 80     Resp 09/22/17 1147 16     Temp 09/22/17 1147 98.1 F (36.7 C)     Temp Source 09/22/17 1147 Oral     SpO2 09/22/17 1147 98 %     Weight 09/22/17 1148 152 lb (68.9 kg)     Height 09/22/17 1148 6\' 3"  (1.905 m)     Head Circumference --      Peak Flow --      Pain Score 09/22/17 1148 0     Pain Loc --      Pain Edu? --      Excl. in [place]? --     Constitutional: Alert and oriented. Well appearing and in no apparent distress. HEENT:      Head: Normocephalic and atraumatic.         Eyes: Conjunctivae are normal. Sclera is non-icteric.       Mouth/Throat: Mucous membranes are moist.       Neck: Supple with no signs of meningismus. Cardiovascular: Regular rate and rhythm. No murmurs, gallops, or rubs. 2+ symmetrical distal pulses are present in all extremities. No JVD. Respiratory: Normal respiratory effort. Lungs are clear to auscultation bilaterally. No wheezes, crackles, or rhonchi.  Gastrointestinal: Soft, non tender, and non distended with positive bowel sounds. No rebound or guarding. Musculoskeletal: Nontender with normal range of motion in all extremities. No edema, cyanosis, or erythema of extremities. Neurologic: Normal speech and language. Face is symmetric. Moving all extremities. No gross focal neurologic deficits are appreciated. Skin: Skin is warm, dry and intact. No rash noted. Psychiatric: Mood and affect are normal. Speech and behavior are normal.  ____________________________________________   LABS (all labs ordered are listed, but only abnormal results are displayed)  Labs Reviewed  RENAL FUNCTION PANEL  CBC   ____________________________________________  EKG  none    ____________________________________________  RADIOLOGY  none  ____________________________________________   PROCEDURES  Procedure(s) performed: None Procedures Critical Care performed:  None ____________________________________________   INITIAL IMPRESSION / ASSESSMENT AND PLAN / ED COURSE  57 y.o. male with h/o ESRD on HD (MWF) who presents for his scheduled dialysis treatment.  Patient has no acute complaints at this time and is in no distress.  Dialysis center has been informed the patient is here.  As part of my medical decision making, I reviewed the following data within the [facility] notes reviewed and incorporated, Old chart reviewed, Notes from prior ED visits and [place] Controlled Substance Database    Pertinent labs & imaging results that were available during my care of the patient were reviewed by me and considered in my medical decision making (see chart for details).    ____________________________________________   FINAL CLINICAL IMPRESSION(S) / ED DIAGNOSES  Final diagnoses:  Encounter for dialysis [facility])      NEW MEDICATIONS STARTED DURING THIS VISIT:  ED Discharge Orders    None       Note:  This document was prepared using Dragon voice recognition software and may include unintentional dictation errors.    Alfred Levins, [place], MD 09/22/17 [id number]

## 2017-09-25 ENCOUNTER — Ambulatory Visit
Admission: RE | Admit: 2017-09-25 | Discharge: 2017-09-25 | Disposition: A | Discharge status: Home / Self Care | Payer: Medicare Other | Source: Ambulatory Visit | Attending: Nephrology | Admitting: Nephrology

## 2017-09-25 ENCOUNTER — Emergency Department: Payer: Medicare Other

## 2017-09-25 ENCOUNTER — Non-Acute Institutional Stay
Admission: EM | Admit: 2017-09-25 | Discharge: 2017-09-25 | Disposition: A | Discharge status: Home / Self Care | Payer: Medicare Other | Attending: Emergency Medicine | Admitting: Emergency Medicine

## 2017-09-25 ENCOUNTER — Encounter: Payer: Self-pay | Admitting: Emergency Medicine

## 2017-09-25 DIAGNOSIS — D123 Benign neoplasm of transverse colon: Secondary | ICD-10-CM | POA: Insufficient documentation

## 2017-09-25 DIAGNOSIS — D631 Anemia in chronic kidney disease: Secondary | ICD-10-CM | POA: Insufficient documentation

## 2017-09-25 DIAGNOSIS — I120 Hypertensive chronic kidney disease with stage 5 chronic kidney disease or end stage renal disease: Secondary | ICD-10-CM | POA: Diagnosis not present

## 2017-09-25 DIAGNOSIS — E785 Hyperlipidemia, unspecified: Secondary | ICD-10-CM | POA: Diagnosis not present

## 2017-09-25 DIAGNOSIS — I509 Heart failure, unspecified: Secondary | ICD-10-CM | POA: Insufficient documentation

## 2017-09-25 DIAGNOSIS — J449 Chronic obstructive pulmonary disease, unspecified: Secondary | ICD-10-CM | POA: Diagnosis not present

## 2017-09-25 DIAGNOSIS — E875 Hyperkalemia: Secondary | ICD-10-CM | POA: Diagnosis not present

## 2017-09-25 DIAGNOSIS — N186 End stage renal disease: Secondary | ICD-10-CM | POA: Diagnosis not present

## 2017-09-25 DIAGNOSIS — K7460 Unspecified cirrhosis of liver: Secondary | ICD-10-CM | POA: Insufficient documentation

## 2017-09-25 DIAGNOSIS — I132 Hypertensive heart and chronic kidney disease with heart failure and with stage 5 chronic kidney disease, or end stage renal disease: Secondary | ICD-10-CM | POA: Diagnosis not present

## 2017-09-25 DIAGNOSIS — Z951 Presence of aortocoronary bypass graft: Secondary | ICD-10-CM | POA: Diagnosis not present

## 2017-09-25 DIAGNOSIS — K2970 Gastritis, unspecified, without bleeding: Secondary | ICD-10-CM | POA: Diagnosis not present

## 2017-09-25 DIAGNOSIS — Z800 Family history of malignant neoplasm of digestive organs: Secondary | ICD-10-CM | POA: Insufficient documentation

## 2017-09-25 DIAGNOSIS — K31811 Angiodysplasia of stomach and duodenum with bleeding: Secondary | ICD-10-CM | POA: Insufficient documentation

## 2017-09-25 DIAGNOSIS — K210 Gastro-esophageal reflux disease with esophagitis: Secondary | ICD-10-CM | POA: Insufficient documentation

## 2017-09-25 DIAGNOSIS — Z841 Family history of disorders of kidney and ureter: Secondary | ICD-10-CM | POA: Diagnosis not present

## 2017-09-25 DIAGNOSIS — I2510 Atherosclerotic heart disease of native coronary artery without angina pectoris: Secondary | ICD-10-CM | POA: Diagnosis not present

## 2017-09-25 DIAGNOSIS — R188 Other ascites: Secondary | ICD-10-CM

## 2017-09-25 DIAGNOSIS — E43 Unspecified severe protein-calorie malnutrition: Secondary | ICD-10-CM | POA: Diagnosis not present

## 2017-09-25 DIAGNOSIS — Z7951 Long term (current) use of inhaled steroids: Secondary | ICD-10-CM | POA: Insufficient documentation

## 2017-09-25 DIAGNOSIS — I8510 Secondary esophageal varices without bleeding: Secondary | ICD-10-CM | POA: Diagnosis not present

## 2017-09-25 DIAGNOSIS — R0602 Shortness of breath: Secondary | ICD-10-CM

## 2017-09-25 DIAGNOSIS — J9601 Acute respiratory failure with hypoxia: Secondary | ICD-10-CM | POA: Insufficient documentation

## 2017-09-25 DIAGNOSIS — Z809 Family history of malignant neoplasm, unspecified: Secondary | ICD-10-CM | POA: Insufficient documentation

## 2017-09-25 DIAGNOSIS — N2581 Secondary hyperparathyroidism of renal origin: Secondary | ICD-10-CM | POA: Diagnosis not present

## 2017-09-25 DIAGNOSIS — Z8614 Personal history of Methicillin resistant Staphylococcus aureus infection: Secondary | ICD-10-CM | POA: Insufficient documentation

## 2017-09-25 DIAGNOSIS — Z992 Dependence on renal dialysis: Secondary | ICD-10-CM | POA: Diagnosis not present

## 2017-09-25 DIAGNOSIS — Z79899 Other long term (current) drug therapy: Secondary | ICD-10-CM | POA: Insufficient documentation

## 2017-09-25 DIAGNOSIS — K219 Gastro-esophageal reflux disease without esophagitis: Secondary | ICD-10-CM | POA: Insufficient documentation

## 2017-09-25 DIAGNOSIS — F17210 Nicotine dependence, cigarettes, uncomplicated: Secondary | ICD-10-CM | POA: Insufficient documentation

## 2017-09-25 LAB — CBC WITH DIFFERENTIAL/PLATELET
BASOS PCT: 1 %
Basophils Absolute: 0.1 10*3/uL (ref 0–0.1)
EOS PCT: 3 %
Eosinophils Absolute: 0.2 10*3/uL (ref 0–0.7)
HEMATOCRIT: 31.5 % — AB (ref 40.0–52.0)
Hemoglobin: 9.5 g/dL — ABNORMAL LOW (ref 13.0–18.0)
Lymphocytes Relative: 9 %
Lymphs Abs: 0.7 10*3/uL — ABNORMAL LOW (ref 1.0–3.6)
MCH: 23.5 pg — ABNORMAL LOW (ref 26.0–34.0)
MCHC: 30.1 g/dL — AB (ref 32.0–36.0)
MCV: 78 fL — ABNORMAL LOW (ref 80.0–100.0)
MONO ABS: 1 10*3/uL (ref 0.2–1.0)
MONOS PCT: 13 %
NEUTROS ABS: 5.7 10*3/uL (ref 1.4–6.5)
Neutrophils Relative %: 74 %
PLATELETS: 255 10*3/uL (ref 150–440)
RBC: 4.04 MIL/uL — ABNORMAL LOW (ref 4.40–5.90)
RDW: 19.8 % — AB (ref 11.5–14.5)
WBC: 7.6 10*3/uL (ref 3.8–10.6)

## 2017-09-25 LAB — PROTIME-INR
INR: 1.1
PROTHROMBIN TIME: 14.1 s (ref 11.4–15.2)

## 2017-09-25 LAB — COMPREHENSIVE METABOLIC PANEL
ALT: 36 U/L (ref 0–44)
ANION GAP: 12 (ref 5–15)
AST: 26 U/L (ref 15–41)
Albumin: 3.2 g/dL — ABNORMAL LOW (ref 3.5–5.0)
Alkaline Phosphatase: 225 U/L — ABNORMAL HIGH (ref 38–126)
BUN: 50 mg/dL — ABNORMAL HIGH (ref 6–20)
CHLORIDE: 104 mmol/L (ref 98–111)
CO2: 25 mmol/L (ref 22–32)
Calcium: 9.3 mg/dL (ref 8.9–10.3)
Creatinine, Ser: 8.97 mg/dL — ABNORMAL HIGH (ref 0.61–1.24)
GFR, EST AFRICAN AMERICAN: 7 mL/min — AB (ref >60)
GFR, EST NON AFRICAN AMERICAN: 6 mL/min — AB (ref >60)
Glucose, Bld: 74 mg/dL (ref 70–99)
POTASSIUM: 7.2 mmol/L — AB (ref 3.5–5.1)
Sodium: 141 mmol/L (ref 135–145)
TOTAL PROTEIN: 7.5 g/dL (ref 6.5–8.1)
Total Bilirubin: 0.5 mg/dL (ref 0.3–1.2)

## 2017-09-25 LAB — BASIC METABOLIC PANEL
ANION GAP: 10 (ref 5–15)
BUN: 28 mg/dL — ABNORMAL HIGH (ref 6–20)
CO2: 27 mmol/L (ref 22–32)
Calcium: 8.3 mg/dL — ABNORMAL LOW (ref 8.9–10.3)
Chloride: 104 mmol/L (ref 98–111)
Creatinine, Ser: 5.55 mg/dL — ABNORMAL HIGH (ref 0.61–1.24)
GFR calc Af Amer: 12 mL/min — ABNORMAL LOW (ref >60)
GFR, EST NON AFRICAN AMERICAN: 10 mL/min — AB (ref >60)
GLUCOSE: 141 mg/dL — AB (ref 70–99)
POTASSIUM: 5.6 mmol/L — AB (ref 3.5–5.1)
Sodium: 141 mmol/L (ref 135–145)

## 2017-09-25 LAB — TROPONIN I
TROPONIN I: 0.1 ng/mL — AB (ref <0.03)
TROPONIN I: 0.08 ng/mL — AB (ref <0.03)

## 2017-09-25 MED ORDER — CHLORHEXIDINE GLUCONATE CLOTH 2 % EX PADS
6.0000 | MEDICATED_PAD | Freq: Every day | CUTANEOUS | Status: DC
  Filled 2017-09-25: qty 6

## 2017-09-25 MED ORDER — ALBUMIN HUMAN 25 % IV SOLN: 25.0000 g | Freq: Once | INTRAVENOUS | Status: DC

## 2017-09-25 MED ORDER — METHYLPREDNISOLONE SODIUM SUCC 125 MG IJ SOLR
125.0000 mg | Freq: Once | INTRAMUSCULAR | Status: AC
  Administered 2017-09-25: 125 mg via INTRAVENOUS
  Filled 2017-09-25: qty 2

## 2017-09-25 MED ORDER — IPRATROPIUM-ALBUTEROL 0.5-2.5 (3) MG/3ML IN SOLN
3.0000 mL | Freq: Once | RESPIRATORY_TRACT | Status: AC
  Administered 2017-09-25: 3 mL via RESPIRATORY_TRACT
  Filled 2017-09-25: qty 3

## 2017-09-25 MED ORDER — ALBUMIN HUMAN 25 % IV SOLN
INTRAVENOUS | Status: AC
  Filled 2017-09-25: qty 100

## 2017-09-25 MED ORDER — EPOETIN ALFA 10000 UNIT/ML IJ SOLN
10000.0000 [IU] | Freq: Once | INTRAMUSCULAR | Status: AC
  Administered 2017-09-25: 10000 [IU] via INTRAVENOUS

## 2017-09-25 MED ORDER — ALBUMIN HUMAN 25 % IV SOLN
25.0000 g | Freq: Once | INTRAVENOUS | Status: DC
  Administered 2017-09-25: 25 g via INTRAVENOUS
  Filled 2017-09-25: qty 100

## 2017-09-25 MED ORDER — HYDRALAZINE HCL 20 MG/ML IJ SOLN
20.0000 mg | Freq: Once | INTRAMUSCULAR | Status: DC
Start: 2017-09-25 — End: 2017-09-25

## 2017-09-25 NOTE — OR Nursing (Signed)
Pt refused albumin after 2ml infused. IV removed and pt discharged from [name]

## 2017-09-25 NOTE — ED Provider Notes (Signed)
[facility] Emergency Department Provider Note    First MD Initiated Contact with Patient 09/25/17 [phone]     (approximate)  I have reviewed the triage vital signs and the nursing notes.   HISTORY  Chief Complaint Shortness of Breath    HPI Stanley [name] is a 57 y.o. male went into this facility with a history of CHF, cirrhosis, CAD, end-stage renal disease on dialysis as well as a history of COPD with persistent drug use presents to the ER where she had chief complaint of shortness of breath abdominal distention that started last night.  Patient called EMS due to shortness of breath.  Was found to be hypoxic to 81% on room air requiring nonrebreather mask albuterol.  Patient does state that he feels that his belly is more distended and has not had paracentesis performed in several weeks.  Denies any fevers.  No chest pain.  No lower extremity swelling.  No cough.    Past Medical History:  Diagnosis Date  . Alcohol abuse   . CHF (congestive heart failure) ([place])   . Cirrhosis ([place])   . Coronary artery disease 2009  . Drug abuse ([place])   . End stage renal disease on dialysis [facility]) NEPHROLOGIST-   DR [facility]  IN [place]   HEMODIALYSIS --   TUES/  THURS/  SAT  . Gastrointestinal bleed 06/13/2017   From chart...hx of multiple GI bleeds  . GERD (gastroesophageal reflux disease)   . Hyperlipidemia   . Hypertension   . PAD (peripheral artery disease) ([place])   . Renal insufficiency    Per pt, 32 oz fluid restriction per day  . S/P triple vessel bypass 06/09/2016   2009ish  . Suicidal ideation    & HOMICIDAL IDEATION --  06-16-2013   ADMITTED TO BEHAVIOR HEALTH   Family History  Problem Relation Age of Onset  . Colon cancer Mother   . Cancer Father   . Cancer Sister   . Kidney disease Brother    Past Surgical History:  Procedure Laterality Date  . A/V FISTULAGRAM Right 06/06/2017   Procedure: A/V FISTULAGRAM;  Surgeon: Katha Cabal, MD;   Location: [place] CV LAB;  Service: Cardiovascular;  Laterality: Right;  . A/V SHUNT INTERVENTION N/A 06/06/2017   Procedure: A/V SHUNT INTERVENTION;  Surgeon: Katha Cabal, MD;  Location: [place] CV LAB;  Service: Cardiovascular;  Laterality: N/A;  . AGILE CAPSULE N/A 06/19/2016   Procedure: AGILE CAPSULE;  Surgeon: [name], MD;  Location: [facility] ENDOSCOPY;  Service: Endoscopy;  Laterality: N/A;  . COLONOSCOPY WITH PROPOFOL N/A 06/18/2016   Procedure: COLONOSCOPY WITH PROPOFOL;  Surgeon: [name], MD;  Location: [facility] ENDOSCOPY;  Service: Endoscopy;  Laterality: N/A;  . COLONOSCOPY WITH PROPOFOL N/A 08/12/2016   Procedure: COLONOSCOPY WITH PROPOFOL;  Surgeon: [name], MD;  Location[facility] ENDOSCOPY;  Service: Endoscopy;  Laterality: N/A;  . COLONOSCOPY WITH PROPOFOL N/A 05/05/2017   Procedure: COLONOSCOPY WITH PROPOFOL;  Surgeon: Manya Silvas, MD;  Location: [facility] ENDOSCOPY;  Service: Endoscopy;  Laterality: N/A;  . CORONARY ANGIOPLASTY  ?   PT UNABLE TO TELL IF  BEFORE OR AFTER  CABG  . CORONARY ARTERY BYPASS GRAFT  2008  ([place] , [place])   3 VESSEL  . DIALYSIS FISTULA CREATION  LAST SURGERY  APPOX  2008  . ENTEROSCOPY N/A 05/10/2016   Procedure: ENTEROSCOPY;  Surgeon: Jerene Bears, MD;  Location: [place];  Service: Gastroenterology;  Laterality: N/A;  .  ENTEROSCOPY N/A 08/12/2016   Procedure: ENTEROSCOPY;  Surgeon: [name], MD;  Location: [facility] ENDOSCOPY;  Service: Endoscopy;  Laterality: N/A;  . ENTEROSCOPY Left 06/03/2017   Procedure: ENTEROSCOPY;  Surgeon: Virgel Manifold, MD;  Location: [facility] ENDOSCOPY;  Service: Endoscopy;  Laterality: Left;  Procedure date will ultimately depend on when patient is medically optimized before the procedure, pending hemodialysis and blood transfusions etc. Will place on schedule and change depending on clinical status.   . ENTEROSCOPY N/A 06/05/2017   Procedure: ENTEROSCOPY;  Surgeon: Virgel Manifold, MD;  Location: [facility]  ENDOSCOPY;  Service: Endoscopy;  Laterality: N/A;  . ENTEROSCOPY N/A 06/15/2017   Procedure: Push ENTEROSCOPY;  Surgeon: [name], MD;  Location: [facility] ENDOSCOPY;  Service: Endoscopy;  Laterality: N/A;  . ESOPHAGOGASTRODUODENOSCOPY N/A 05/07/2015   Procedure: ESOPHAGOGASTRODUODENOSCOPY (EGD);  Surgeon: Hulen Luster, MD;  Location: [facility] ENDOSCOPY;  Service: Endoscopy;  Laterality: N/A;  . ESOPHAGOGASTRODUODENOSCOPY (EGD) WITH PROPOFOL N/A 05/17/2015   Procedure: ESOPHAGOGASTRODUODENOSCOPY (EGD) WITH PROPOFOL;  Surgeon: [name], MD;  Location: [facility] ENDOSCOPY;  Service: Endoscopy;  Laterality: N/A;  . ESOPHAGOGASTRODUODENOSCOPY (EGD) WITH PROPOFOL N/A 01/20/2016   Procedure: ESOPHAGOGASTRODUODENOSCOPY (EGD) WITH PROPOFOL;  Surgeon: [name], MD;  Location: [facility] ENDOSCOPY;  Service: Endoscopy;  Laterality: N/A;  . ESOPHAGOGASTRODUODENOSCOPY (EGD) WITH PROPOFOL N/A 04/17/2016   Procedure: ESOPHAGOGASTRODUODENOSCOPY (EGD) WITH PROPOFOL;  Surgeon: Lin Landsman, MD;  Location: [facility] ENDOSCOPY;  Service: Gastroenterology;  Laterality: N/A;  . ESOPHAGOGASTRODUODENOSCOPY (EGD) WITH PROPOFOL  05/09/2016   Procedure: ESOPHAGOGASTRODUODENOSCOPY (EGD) WITH PROPOFOL;  Surgeon: Jerene Bears, MD;  Location: [place];  Service: Endoscopy;;  . ESOPHAGOGASTRODUODENOSCOPY (EGD) WITH PROPOFOL N/A 06/16/2016   Procedure: ESOPHAGOGASTRODUODENOSCOPY (EGD) WITH PROPOFOL;  Surgeon: [name], MD;  Location: [facility] ENDOSCOPY;  Service: Endoscopy;  Laterality: N/A;  . ESOPHAGOGASTRODUODENOSCOPY (EGD) WITH PROPOFOL N/A 05/05/2017   Procedure: ESOPHAGOGASTRODUODENOSCOPY (EGD) WITH PROPOFOL;  Surgeon: Manya Silvas, MD;  Location: [facility] ENDOSCOPY;  Service: Endoscopy;  Laterality: N/A;  . ESOPHAGOGASTRODUODENOSCOPY (EGD) WITH PROPOFOL N/A 06/15/2017   Procedure: ESOPHAGOGASTRODUODENOSCOPY (EGD) WITH PROPOFOL;  Surgeon: [name], MD;  Location: [facility] ENDOSCOPY;  Service: Endoscopy;  Laterality: N/A;  . GIVENS CAPSULE STUDY  N/A 05/07/2016   Procedure: GIVENS CAPSULE STUDY;  Surgeon: Doran Stabler, MD;  Location: [place];  Service: Endoscopy;  Laterality: N/A;  . MANDIBULAR HARDWARE REMOVAL N/A 07/29/2013   Procedure: REMOVAL OF ARCH BARS;  Surgeon: Theodoro Kos, DO;  Location: [place];  Service: Plastics;  Laterality: N/A;  . ORIF MANDIBULAR FRACTURE N/A 06/05/2013   Procedure: REPAIR OF MANDIBULAR FRACTURE x 2 with maxillo-mandibular fixation ;  Surgeon: Theodoro Kos, DO;  Location: [place];  Service: Plastics;  Laterality: N/A;  . PERIPHERAL ARTERIAL STENT GRAFT Left    Patient Active Problem List   Diagnosis Date Noted  . ESRD (end stage renal disease) on dialysis ([place]) 07/28/2017  . Protein-calorie malnutrition, severe 06/14/2017  . Encounter for dialysis [facility])   . Palliative care by specialist   . Goals of care, counseling/discussion   . Malnutrition of moderate degree 06/05/2017  . Secondary esophageal varices without bleeding ([place])   . Stomach irritation   . Idiopathic esophageal varices without bleeding ([place])   . Alcoholic hepatitis with ascites 05/24/2017  . ESRD (end stage renal disease) ([place]) 04/28/2017  . Uremia 03/08/2017  . ESRD on hemodialysis ([place]) 03/03/2017  . Weakness 02/28/2017  . Hypocalcemia 02/22/2017  . Shortness of breath 11/26/2016  . COPD (chronic obstructive pulmonary disease) ([place]) 10/30/2016  .  COPD exacerbation ([place]) 10/29/2016  . Anemia   . Heme positive stool   . Ulceration of intestine   . Benign neoplasm of transverse colon   . Acute gastrointestinal hemorrhage   . Esophageal candidiasis ([place])   . Angiodysplasia of intestinal tract   . Acute respiratory failure with hypoxia ([place]) 07/03/2016  . GI bleeding 06/24/2016  . Rectal bleeding 06/14/2016  . Anemia of chronic disease 06/01/2016  . MRSA carrier 06/01/2016  . Chronic renal failure 05/23/2016  . Ischemic heart disease 05/23/2016  . Angiodysplasia of small intestine   . Melena   .  Small bowel bleed not requiring more than 4 units of blood in 24 hours, ICU, or surgery   . Anemia due to chronic blood loss   . Abdominal pain 05/05/2016  . Acute posthemorrhagic anemia 04/17/2016  . Gastrointestinal bleed 04/17/2016  . History of esophagogastroduodenoscopy (EGD) 04/17/2016  . Elevated troponin 04/17/2016  . Alcohol abuse 04/17/2016  . Upper GI bleed 01/19/2016  . Blood in stool   . Angiodysplasia of stomach and duodenum with hemorrhage   . Gastritis   . Reflux esophagitis   . GI bleed 05/16/2015  . Acute GI bleeding   . Symptomatic anemia 04/30/2015  . HTN (hypertension) 04/06/2015  . GERD (gastroesophageal reflux disease) 04/06/2015  . HLD (hyperlipidemia) 04/06/2015  . Dyspnea 04/06/2015  . Cirrhosis of liver with ascites ([place]) 04/06/2015  . Ascites 04/06/2015  . GIB (gastrointestinal bleeding) 03/23/2015  . Homicidal ideation 06/19/2013  . Suicidal intent 06/19/2013  . Homicidal ideations 06/19/2013  . Hyperkalemia 06/16/2013  . Mandible fracture ([place]) 06/05/2013  . Fracture, mandible ([place]) 06/02/2013  . Coronary atherosclerosis of native coronary artery 06/02/2013  . ESRD on dialysis ([name]) 06/02/2013  . Mandible open fracture ([place]) 06/02/2013      Prior to Admission medications   Medication Sig Start Date End Date Taking? Authorizing Provider  albuterol (PROVENTIL HFA;VENTOLIN HFA) 108 (90 Base) MCG/ACT inhaler Inhale 2 puffs into the lungs every 6 (six) hours as needed for wheezing or shortness of breath. 04/21/17   Lisa Roca, MD  alum & mag hydroxide-simeth (MAALOX/MYLANTA) 200-200-20 MG/5ML suspension Take 15 mLs every 4 (four) hours as needed by mouth for indigestion or heartburn. Patient not taking: Reported on 04/19/2017 01/25/17   Demetrios Loll, MD  atorvastatin (LIPITOR) 40 MG tablet Take 1 tablet (40 mg total) by mouth daily. 11/16/16   Vaughan Basta, MD  budesonide-formoterol (SYMBICORT) 160-4.5 MCG/ACT inhaler Inhale 2 puffs into the  lungs daily. 11/16/16   Vaughan Basta, MD  calcium acetate (PHOSLO) 667 MG capsule Take 2,001 mg by mouth 3 (three) times daily.    [provider]  doxycycline (VIBRA-TABS) 100 MG tablet Take 1 tablet (100 mg total) by mouth 2 (two) times daily. Patient not taking: Reported on 08/28/2017 07/10/17   Harvest Dark, MD  ferrous sulfate 325 (65 FE) MG tablet Take 1 tablet by mouth 3 (three) times daily. 01/21/17   [provider]  furosemide (LASIX) 80 MG tablet Take 1 tablet (80 mg total) daily by mouth. 01/25/17   Demetrios Loll, MD  gabapentin (NEURONTIN) 300 MG capsule Take 1 capsule by mouth daily as directed 08/16/16   [provider]  ipratropium-albuterol (DUONEB) 0.5-2.5 (3) MG/3ML SOLN Take 3 mLs by nebulization every 6 (six) hours as needed. 11/28/16   Loletha Grayer, MD  labetalol (NORMODYNE) 100 MG tablet Take 100 mg by mouth daily. 03/24/17   [provider]  Multiple Vitamins-Minerals-FA (DIALYVITE SUPREME D)  3 MG TABS Take 1 tablet (3 mg total) by mouth daily. 11/28/16   Loletha Grayer, MD  nitroGLYCERIN (NITROSTAT) 0.4 MG SL tablet Place 1 tablet (0.4 mg total) under the tongue every 5 (five) minutes as needed. 04/12/16   Wende Bushy, MD  pantoprazole (PROTONIX) 40 MG tablet Take 1 tablet (40 mg total) by mouth daily. 11/28/16   Loletha Grayer, MD  Spacer/Aero Chamber Mouthpiece MISC 1 Units by Does not apply route every 4 (four) hours as needed (wheezing). 02/19/17   Darel Hong, MD  tiotropium (SPIRIVA HANDIHALER) 18 MCG inhalation capsule Place 1 capsule (18 mcg total) into inhaler and inhale daily. 11/16/16 11/16/17  Vaughan Basta, MD    Allergies Patient has no known allergies.    Social History Social History   Tobacco Use  . Smoking status: Current Every Day Smoker    Packs/day: 0.15    Years: 40.00    Pack years: 6.00    Types: Cigarettes  . Smokeless tobacco: Never Used  Substance Use Topics  . Alcohol use: No      Comment: pt reports quitting after learning about cirrhosis  . Drug use: No    Frequency: 7.0 times per week    Types: Marijuana, Cocaine    Review of Systems Patient denies headaches, rhinorrhea, blurry vision, numbness, shortness of breath, chest pain, edema, cough, abdominal pain, nausea, vomiting, diarrhea, dysuria, fevers, rashes or hallucinations unless otherwise stated above in HPI. ____________________________________________   PHYSICAL EXAM:  VITAL SIGNS: Vitals:   09/25/17 1239 09/25/17 1301  BP: 116/78 133/76  Pulse: 87 90  Resp:  18  Temp:    SpO2:  100%    Constitutional: Alert and oriented. In moderate resp distress Eyes: Conjunctivae are normal.  Head: Atraumatic. Nose: No congestion/rhinnorhea. Mouth/Throat: Mucous membranes are moist.   Neck: No stridor. Painless ROM.  Cardiovascular: Normal rate, regular rhythm. Grossly normal heart sounds.  Good peripheral circulation. Respiratory:tachypnea with use of accessory muscles,  Diffuse coarse wheeze in all lung fields Gastrointestinal: Soft and nontender.+ with fluid wave. No abdominal bruits. No CVA tenderness. Genitourinary:  Musculoskeletal: No lower extremity tenderness nor edema.  No joint effusions. Neurologic:  Normal speech and language. No gross focal neurologic deficits are appreciated. No facial droop Skin:  Skin is warm, dry and intact. No rash noted. Psychiatric: Mood and affect are normal. Speech and behavior are normal.  ____________________________________________   LABS (all labs ordered are listed, but only abnormal results are displayed)  Results for orders placed or performed during the hospital encounter of 09/25/17 (from the past 24 hour(s))  CBC with Differential/Platelet     Status: Abnormal   Collection Time: 09/25/17  7:43 AM  Result Value Ref Range   WBC 7.6 3.8 - 10.6 K/uL   RBC 4.04 (L) 4.40 - 5.90 MIL/uL   Hemoglobin 9.5 (L) 13.0 - 18.0 g/dL   HCT 31.5 (L) 40.0 - 52.0 %    MCV 78.0 (L) 80.0 - 100.0 fL   MCH 23.5 (L) 26.0 - 34.0 pg   MCHC 30.1 (L) 32.0 - 36.0 g/dL   RDW 19.8 (H) 11.5 - 14.5 %   Platelets 255 150 - 440 K/uL   Neutrophils Relative % 74 %   Neutro Abs 5.7 1.4 - 6.5 K/uL   Lymphocytes Relative 9 %   Lymphs Abs 0.7 (L) 1.0 - 3.6 K/uL   Monocytes Relative 13 %   Monocytes Absolute 1.0 0.2 - 1.0 K/uL   Eosinophils Relative 3 %  Eosinophils Absolute 0.2 0 - 0.7 K/uL   Basophils Relative 1 %   Basophils Absolute 0.1 0 - 0.1 K/uL  Comprehensive metabolic panel     Status: Abnormal   Collection Time: 09/25/17  7:43 AM  Result Value Ref Range   Sodium 141 135 - 145 mmol/L   Potassium 7.2 (HH) 3.5 - 5.1 mmol/L   Chloride 104 98 - 111 mmol/L   CO2 25 22 - 32 mmol/L   Glucose, Bld 74 70 - 99 mg/dL   BUN 50 (H) 6 - 20 mg/dL   Creatinine, Ser 8.97 (H) 0.61 - 1.24 mg/dL   Calcium 9.3 8.9 - 10.3 mg/dL   Total Protein 7.5 6.5 - 8.1 g/dL   Albumin 3.2 (L) 3.5 - 5.0 g/dL   AST 26 15 - 41 U/L   ALT 36 0 - 44 U/L   Alkaline Phosphatase 225 (H) 38 - 126 U/L   Total Bilirubin 0.5 0.3 - 1.2 mg/dL   GFR calc non Af Amer 6 (L) >60 mL/min   GFR calc Af Amer 7 (L) >60 mL/min   Anion gap 12 5 - 15  Troponin I     Status: Abnormal   Collection Time: 09/25/17  7:43 AM  Result Value Ref Range   Troponin I 0.10 (HH) <0.03 ng/mL  Protime-INR     Status: None   Collection Time: 09/25/17  7:43 AM  Result Value Ref Range   Prothrombin Time 14.1 11.4 - 15.2 seconds   INR 1.10    ____________________________________________  EKG My review and personal interpretation at Time: 7:45   Indication: sob  Rate: 90  Rhythm: sinus Axis: normal Other: prominent r waves with nonspecific st abn in infero lateral distribution ____________________________________________  RADIOLOGY  I personally reviewed all radiographic images ordered to evaluate for the above acute complaints and reviewed radiology reports and findings.  These findings were personally discussed  with the patient.  Please see medical record for radiology report.  ____________________________________________   PROCEDURES  Procedure(s) performed:  .Critical Care Performed by: Merlyn Lot, MD Authorized by: Merlyn Lot, MD   Critical care provider statement:    Critical care time (minutes):  35   Critical care time was exclusive of:  Separately billable procedures and treating other patients   Critical care was necessary to treat or prevent imminent or life-threatening deterioration of the following conditions:  Renal failure and respiratory failure   Critical care was time spent personally by me on the following activities:  Development of treatment plan with patient or surrogate, discussions with consultants, evaluation of patient's response to treatment, examination of patient, obtaining history from patient or surrogate, ordering and performing treatments and interventions, ordering and review of laboratory studies, ordering and review of radiographic studies, pulse oximetry, re-evaluation of patient's condition and review of old charts      Critical Care performed: yes ____________________________________________   INITIAL IMPRESSION / ASSESSMENT AND PLAN / ED COURSE  Pertinent labs & imaging results that were available during my care of the patient were reviewed by me and considered in my medical decision making (see chart for details).   DDX: chf, APE, copd, pna, ascites, anemia  Stanley [name] is a 57 y.o. who presents to the ED with chief complaint of shortness of breath abdominal distention.  Does have ascites but it is not terribly tense.  Exam and presentation were concerning for volume overload with pulmonary edema.  Noted to have hyperkalemia but only mildly elevated and  no EKG changes.  Clinical Course as of Sep 26 1315  Mon Sep 25, 2017  1610 Discussed case with Dr. Juleen China renal.  Potassium is elevated but without EKG changes.  Trop elevated.   Patient without pain at this time.  Likely 2/2 renal and heart failure.  Plan will be to have patient dialyzed and reassess after dialysis to see patient requires admission.  He is still tachypneic but not hypoxic.  Symptoms likely secondary to volume overload.  He is next up for dialysis.   [PR]  [id number] Patient reassessed.  Remains hemodynamically stable.  Being taken down to dialysis at this time.   [PR]    Clinical Course User Index [PR] Merlyn Lot, MD     As part of my medical decision making, I reviewed the following data within the [place] notes reviewed and incorporated, Labs reviewed, notes from prior ED visits and [place] Controlled Substance Database   ____________________________________________   FINAL CLINICAL IMPRESSION(S) / ED DIAGNOSES  Final diagnoses:  Hyperkalemia  SOB (shortness of breath)      NEW MEDICATIONS STARTED DURING THIS VISIT:  New Prescriptions   No medications on file     Note:  This document was prepared using Dragon voice recognition software and may include unintentional dictation errors.    Merlyn Lot, MD 09/25/17 [phone]

## 2017-09-25 NOTE — ED Notes (Signed)
Date and time results received: 09/25/17 1352  Test: troponin Critical Value: 0.08  Name of Provider Notified: Dr. Jimmye Norman

## 2017-09-25 NOTE — ED Notes (Signed)
Pt walking around hallway. No distress noted while walking.

## 2017-09-25 NOTE — Progress Notes (Signed)
HD ended 

## 2017-09-25 NOTE — Progress Notes (Signed)
Post HD  

## 2017-09-25 NOTE — ED Provider Notes (Signed)
[facility] Emergency Department Provider Note       Time seen: ----------------------------------------- 1:21 PM on 09/25/2017 -----------------------------------------   I have reviewed the triage vital signs and the nursing notes.  HISTORY   Chief Complaint Shortness of Breath    HPI Stanley [name] is a 57 y.o. male with a history of alcohol abuse, CHF, cirrhosis, coronary disease, drug abuse, end-stage renal disease on dialysis who presents to the ED for reevaluation after dialysis.  Patient is supposed to have outpatient paracentesis done as well.  We will repeat labs today after dialysis.  He denies current complaints, denies shortness of breath.  Past Medical History:  Diagnosis Date  . Alcohol abuse   . CHF (congestive heart failure) ([place])   . Cirrhosis ([place])   . Coronary artery disease 2009  . Drug abuse ([place])   . End stage renal disease on dialysis [facility]) NEPHROLOGIST-   DR [facility]  IN [place]   HEMODIALYSIS --   TUES/  THURS/  SAT  . Gastrointestinal bleed 06/13/2017   From chart...hx of multiple GI bleeds  . GERD (gastroesophageal reflux disease)   . Hyperlipidemia   . Hypertension   . PAD (peripheral artery disease) ([place])   . Renal insufficiency    Per pt, 32 oz fluid restriction per day  . S/P triple vessel bypass 06/09/2016   2009ish  . Suicidal ideation    & HOMICIDAL IDEATION --  06-16-2013   ADMITTED TO BEHAVIOR HEALTH    Patient Active Problem List   Diagnosis Date Noted  . ESRD (end stage renal disease) on dialysis ([place]) 07/28/2017  . Protein-calorie malnutrition, severe 06/14/2017  . Encounter for dialysis [facility])   . Palliative care by specialist   . Goals of care, counseling/discussion   . Malnutrition of moderate degree 06/05/2017  . Secondary esophageal varices without bleeding ([place])   . Stomach irritation   . Idiopathic esophageal varices without bleeding ([place])   . Alcoholic hepatitis with ascites 05/24/2017   . ESRD (end stage renal disease) ([place]) 04/28/2017  . Uremia 03/08/2017  . ESRD on hemodialysis ([place]) 03/03/2017  . Weakness 02/28/2017  . Hypocalcemia 02/22/2017  . Shortness of breath 11/26/2016  . COPD (chronic obstructive pulmonary disease) ([place]) 10/30/2016  . COPD exacerbation ([place]) 10/29/2016  . Anemia   . Heme positive stool   . Ulceration of intestine   . Benign neoplasm of transverse colon   . Acute gastrointestinal hemorrhage   . Esophageal candidiasis ([place])   . Angiodysplasia of intestinal tract   . Acute respiratory failure with hypoxia ([place]) 07/03/2016  . GI bleeding 06/24/2016  . Rectal bleeding 06/14/2016  . Anemia of chronic disease 06/01/2016  . MRSA carrier 06/01/2016  . Chronic renal failure 05/23/2016  . Ischemic heart disease 05/23/2016  . Angiodysplasia of small intestine   . Melena   . Small bowel bleed not requiring more than 4 units of blood in 24 hours, ICU, or surgery   . Anemia due to chronic blood loss   . Abdominal pain 05/05/2016  . Acute posthemorrhagic anemia 04/17/2016  . Gastrointestinal bleed 04/17/2016  . History of esophagogastroduodenoscopy (EGD) 04/17/2016  . Elevated troponin 04/17/2016  . Alcohol abuse 04/17/2016  . Upper GI bleed 01/19/2016  . Blood in stool   . Angiodysplasia of stomach and duodenum with hemorrhage   . Gastritis   . Reflux esophagitis   . GI bleed 05/16/2015  . Acute GI bleeding   . Symptomatic anemia 04/30/2015  .  HTN (hypertension) 04/06/2015  . GERD (gastroesophageal reflux disease) 04/06/2015  . HLD (hyperlipidemia) 04/06/2015  . Dyspnea 04/06/2015  . Cirrhosis of liver with ascites ([place]) 04/06/2015  . Ascites 04/06/2015  . GIB (gastrointestinal bleeding) 03/23/2015  . Homicidal ideation 06/19/2013  . Suicidal intent 06/19/2013  . Homicidal ideations 06/19/2013  . Hyperkalemia 06/16/2013  . Mandible fracture ([place]) 06/05/2013  . Fracture, mandible ([place]) 06/02/2013  . Coronary atherosclerosis of  native coronary artery 06/02/2013  . ESRD on dialysis ([place]) 06/02/2013  . Mandible open fracture ([place]) 06/02/2013    Past Surgical History:  Procedure Laterality Date  . A/V FISTULAGRAM Right 06/06/2017   Procedure: A/V FISTULAGRAM;  Surgeon: Katha Cabal, MD;  Location: [place] CV LAB;  Service: Cardiovascular;  Laterality: Right;  . A/V SHUNT INTERVENTION N/A 06/06/2017   Procedure: A/V SHUNT INTERVENTION;  Surgeon: Katha Cabal, MD;  Location: [place] CV LAB;  Service: Cardiovascular;  Laterality: N/A;  . AGILE CAPSULE N/A 06/19/2016   Procedure: AGILE CAPSULE;  Surgeon: [name], MD;  Location: [facility] ENDOSCOPY;  Service: Endoscopy;  Laterality: N/A;  . COLONOSCOPY WITH PROPOFOL N/A 06/18/2016   Procedure: COLONOSCOPY WITH PROPOFOL;  Surgeon: [name], MD;  Location: [facility] ENDOSCOPY;  Service: Endoscopy;  Laterality: N/A;  . COLONOSCOPY WITH PROPOFOL N/A 08/12/2016   Procedure: COLONOSCOPY WITH PROPOFOL;  Surgeon: [name], MD;  Location[facility] ENDOSCOPY;  Service: Endoscopy;  Laterality: N/A;  . COLONOSCOPY WITH PROPOFOL N/A 05/05/2017   Procedure: COLONOSCOPY WITH PROPOFOL;  Surgeon: Manya Silvas, MD;  Location: [facility] ENDOSCOPY;  Service: Endoscopy;  Laterality: N/A;  . CORONARY ANGIOPLASTY  ?   PT UNABLE TO TELL IF  BEFORE OR AFTER  CABG  . CORONARY ARTERY BYPASS GRAFT  2008  ([place])   3 VESSEL  . DIALYSIS FISTULA CREATION  LAST SURGERY  APPOX  2008  . ENTEROSCOPY N/A 05/10/2016   Procedure: ENTEROSCOPY;  Surgeon: Jerene Bears, MD;  Location: [place];  Service: Gastroenterology;  Laterality: N/A;  . ENTEROSCOPY N/A 08/12/2016   Procedure: ENTEROSCOPY;  Surgeon: [name], MD;  Location: [facility] ENDOSCOPY;  Service: Endoscopy;  Laterality: N/A;  . ENTEROSCOPY Left 06/03/2017   Procedure: ENTEROSCOPY;  Surgeon: Virgel Manifold, MD;  Location: [facility] ENDOSCOPY;  Service: Endoscopy;  Laterality: Left;  Procedure date will ultimately depend on when  patient is medically optimized before the procedure, pending hemodialysis and blood transfusions etc. Will place on schedule and change depending on clinical status.   . ENTEROSCOPY N/A 06/05/2017   Procedure: ENTEROSCOPY;  Surgeon: Virgel Manifold, MD;  Location: [facility] ENDOSCOPY;  Service: Endoscopy;  Laterality: N/A;  . ENTEROSCOPY N/A 06/15/2017   Procedure: Push ENTEROSCOPY;  Surgeon: [name], MD;  Location: [facility] ENDOSCOPY;  Service: Endoscopy;  Laterality: N/A;  . ESOPHAGOGASTRODUODENOSCOPY N/A 05/07/2015   Procedure: ESOPHAGOGASTRODUODENOSCOPY (EGD);  Surgeon: Hulen Luster, MD;  Location: [facility] ENDOSCOPY;  Service: Endoscopy;  Laterality: N/A;  . ESOPHAGOGASTRODUODENOSCOPY (EGD) WITH PROPOFOL N/A 05/17/2015   Procedure: ESOPHAGOGASTRODUODENOSCOPY (EGD) WITH PROPOFOL;  Surgeon: [name], MD;  Location: [facility] ENDOSCOPY;  Service: Endoscopy;  Laterality: N/A;  . ESOPHAGOGASTRODUODENOSCOPY (EGD) WITH PROPOFOL N/A 01/20/2016   Procedure: ESOPHAGOGASTRODUODENOSCOPY (EGD) WITH PROPOFOL;  Surgeon: [name], MD;  Location: [facility] ENDOSCOPY;  Service: Endoscopy;  Laterality: N/A;  . ESOPHAGOGASTRODUODENOSCOPY (EGD) WITH PROPOFOL N/A 04/17/2016   Procedure: ESOPHAGOGASTRODUODENOSCOPY (EGD) WITH PROPOFOL;  Surgeon: Lin Landsman, MD;  Location: [facility] ENDOSCOPY;  Service: Gastroenterology;  Laterality: N/A;  . ESOPHAGOGASTRODUODENOSCOPY (EGD) WITH  PROPOFOL  05/09/2016   Procedure: ESOPHAGOGASTRODUODENOSCOPY (EGD) WITH PROPOFOL;  Surgeon: Jerene Bears, MD;  Location: [place];  Service: Endoscopy;;  . ESOPHAGOGASTRODUODENOSCOPY (EGD) WITH PROPOFOL N/A 06/16/2016   Procedure: ESOPHAGOGASTRODUODENOSCOPY (EGD) WITH PROPOFOL;  Surgeon: [name], MD;  Location: [facility] ENDOSCOPY;  Service: Endoscopy;  Laterality: N/A;  . ESOPHAGOGASTRODUODENOSCOPY (EGD) WITH PROPOFOL N/A 05/05/2017   Procedure: ESOPHAGOGASTRODUODENOSCOPY (EGD) WITH PROPOFOL;  Surgeon: Manya Silvas, MD;  Location: [facility] ENDOSCOPY;   Service: Endoscopy;  Laterality: N/A;  . ESOPHAGOGASTRODUODENOSCOPY (EGD) WITH PROPOFOL N/A 06/15/2017   Procedure: ESOPHAGOGASTRODUODENOSCOPY (EGD) WITH PROPOFOL;  Surgeon: [name], MD;  Location: [facility] ENDOSCOPY;  Service: Endoscopy;  Laterality: N/A;  . GIVENS CAPSULE STUDY N/A 05/07/2016   Procedure: GIVENS CAPSULE STUDY;  Surgeon: Doran Stabler, MD;  Location: [place];  Service: Endoscopy;  Laterality: N/A;  . MANDIBULAR HARDWARE REMOVAL N/A 07/29/2013   Procedure: REMOVAL OF ARCH BARS;  Surgeon: Theodoro Kos, DO;  Location: [place];  Service: Plastics;  Laterality: N/A;  . ORIF MANDIBULAR FRACTURE N/A 06/05/2013   Procedure: REPAIR OF MANDIBULAR FRACTURE x 2 with maxillo-mandibular fixation ;  Surgeon: Theodoro Kos, DO;  Location: [place];  Service: Plastics;  Laterality: N/A;  . PERIPHERAL ARTERIAL STENT GRAFT Left     Allergies Patient has no known allergies.  Social History Social History   Tobacco Use  . Smoking status: Current Every Day Smoker    Packs/day: 0.15    Years: 40.00    Pack years: 6.00    Types: Cigarettes  . Smokeless tobacco: Never Used  Substance Use Topics  . Alcohol use: No    Comment: pt reports quitting after learning about cirrhosis  . Drug use: No    Frequency: 7.0 times per week    Types: Marijuana, Cocaine   Review of Systems Constitutional: Negative for fever. Cardiovascular: Negative for chest pain. Respiratory: Negative for shortness of breath. Gastrointestinal: Negative for abdominal pain, vomiting and diarrhea. Musculoskeletal: Negative for back pain. Skin: Negative for rash. Neurological: Negative for headaches, focal weakness or numbness.  All systems negative/normal/unremarkable except as stated in the HPI  ____________________________________________   PHYSICAL EXAM:  VITAL SIGNS: ED Triage Vitals  Enc Vitals Group     BP 09/25/17 0742 (!) 154/100     Pulse Rate 09/25/17 0742 87     Resp  09/25/17 0742 (!) 21     Temp 09/25/17 0742 98.7 F (37.1 C)     Temp Source 09/25/17 0742 Oral     SpO2 09/25/17 0739 (!) 81 %     Weight 09/25/17 0745 152 lb (68.9 kg)     Height 09/25/17 0745 6\' 3"  (1.905 m)     Head Circumference --      Peak Flow --      Pain Score 09/25/17 0743 0     Pain Loc --      Pain Edu? --      Excl. in [place]? --    Constitutional: Alert and oriented.  Chronically ill-appearing, no distress Eyes: Conjunctivae are normal. Normal extraocular movements. Cardiovascular: Normal rate, regular rhythm. No murmurs, rubs, or gallops. Respiratory: Normal respiratory effort without tachypnea nor retractions. Breath sounds are clear and equal bilaterally. No wheezes/rales/rhonchi. Gastrointestinal: Mild abdominal distention, normal bowel sounds Musculoskeletal: Nontender with normal range of motion in extremities. No lower extremity tenderness nor edema. Neurologic:  Normal speech and language. No gross focal neurologic deficits are appreciated.  Skin:  Skin is warm, dry and  intact. No rash noted. Psychiatric: Mood and affect are normal. Speech and behavior are normal.  ____________________________________________  ED COURSE:  As part of my medical decision making, I reviewed the following data within the [place] History obtained from family if available, nursing notes, old chart and ekg, as well as notes from prior ED visits. Patient presented for dialysis, we will reassess with labs.  He states he currently feels better. Clinical Course as of Sep 26 1319  Mon Sep 25, 2017  [id number] Discussed case with Dr. Juleen China renal.  Potassium is elevated but without EKG changes.  Trop elevated.  Patient without pain at this time.  Likely 2/2 renal and heart failure.  Plan will be to have patient dialyzed and reassess after dialysis to see patient requires admission.  He is still tachypneic but not hypoxic.  Symptoms likely secondary to volume overload.  He is next  up for dialysis.   [PR]  [id number] Patient reassessed.  Remains hemodynamically stable.  Being taken down to dialysis at this time.   [PR]    Clinical Course User Index [PR] Merlyn Lot, MD   Procedures ____________________________________________   LABS (pertinent positives/negatives)  Labs Reviewed  CBC WITH DIFFERENTIAL/PLATELET - Abnormal; Notable for the following components:      Result Value   RBC 4.04 (*)    Hemoglobin 9.5 (*)    HCT 31.5 (*)    MCV 78.0 (*)    MCH 23.5 (*)    MCHC 30.1 (*)    RDW 19.8 (*)    Lymphs Abs 0.7 (*)    All other components within normal limits  COMPREHENSIVE METABOLIC PANEL - Abnormal; Notable for the following components:   Potassium 7.2 (*)    BUN 50 (*)    Creatinine, Ser 8.97 (*)    Albumin 3.2 (*)    Alkaline Phosphatase 225 (*)    GFR calc non Af Amer 6 (*)    GFR calc Af Amer 7 (*)    All other components within normal limits  TROPONIN I - Abnormal; Notable for the following components:   Troponin I 0.10 (*)    All other components within normal limits  TROPONIN I - Abnormal; Notable for the following components:   Troponin I 0.08 (*)    All other components within normal limits  BASIC METABOLIC PANEL - Abnormal; Notable for the following components:   Potassium 5.6 (*)    Glucose, Bld 141 (*)    BUN 28 (*)    Creatinine, Ser 5.55 (*)    Calcium 8.3 (*)    GFR calc non Af Amer 10 (*)    GFR calc Af Amer 12 (*)    All other components within normal limits  PROTIME-INR   ____________________________________________  DIFFERENTIAL DIAGNOSIS   End-stage renal disease on dialysis, volume overload, electrolyte abnormality  FINAL ASSESSMENT AND PLAN  End-stage renal disease on dialysis   Plan: The patient had presented for evaluation after dialysis and before paracentesis. Patient's labs do show improvement dramatically after dialysis as expected.  He is cleared for paracentesis today.   Laurence Aly,  MD   Note: This note was generated in part or whole with voice recognition software. Voice recognition is usually quite accurate but there are transcription errors that can and very often do occur. I apologize for any typographical errors that were not detected and corrected.     Earleen Newport, MD 09/25/17 [phone]

## 2017-09-25 NOTE — Progress Notes (Signed)
Pre HD  

## 2017-09-25 NOTE — ED Triage Notes (Signed)
Pt via ems from home with sob increasing since last night. Pt is dialysis pt with copd, cirrhosis. Pt speaking in short phrases, sounds breathless. Arrives on NRB @ 10L due too 81% on RA when FD arrived. Pt alert & oriented. Dialysis due today.

## 2017-09-25 NOTE — ED Notes (Signed)
[name] called stating that pt has outpt paracentesis scheduled and to call and let them know when he is DC so he can come check [facility] mall for outpt paracentesis. Informed us that pt might be admitted, awaiting labs. Informed us that this RN will call them back with updated plan of care.

## 2017-09-25 NOTE — Progress Notes (Signed)
[facility]  ROUNDING NOTE   Subjective:   Presents with increasing shortness of breath and increasing abdominal girth. Last hemodialysis was Friday, 7/12.   K 7.2 - no EKG changes.   Breathing 2L [place]   Objective:  Vital signs in last 24 hours:  Temp:  [98.7 F (37.1 C)] 98.7 F (37.1 C) (07/15 0742) Pulse Rate:  [87] 87 (07/15 0742) Resp:  [21] 21 (07/15 0742) BP: (154)/(100) 154/100 (07/15 0742) SpO2:  [81 %-98 %] 98 % (07/15 0742) Weight:  [68.9 kg (152 lb)] 68.9 kg (152 lb) (07/15 0745)  Weight change:  Filed Weights   09/25/17 0745  Weight: 68.9 kg (152 lb)    Intake/Output: No intake/output data recorded.   Intake/Output this shift:  No intake/output data recorded.  Physical Exam: General: No acute distress  Head: Normocephalic, atraumatic. Moist oral mucosal membranes  Eyes: Anicteric  Neck: Supple, trachea midline  Lungs:  Clear to auscultation, normal effort  Heart: S1S2 no rubs  Abdomen:  + distended  Extremities: no peripheral edema.  Neurologic: nonfocal  Skin: No lesions  Access: R IJ permcath    Basic Metabolic Panel: Recent Labs  Lab 09/18/17 1556 09/20/17 1225 09/22/17 1303 09/25/17 0743  NA 140 139 138 141  K 6.6* 6.8* 6.7* 7.2*  CL 103 104 104 104  CO2 24 25 24 25   GLUCOSE 84 77 131* 74  BUN 57* 50* 46* 50*  CREATININE 9.98* 8.76* 7.99* 8.97*  CALCIUM 8.5* 8.7* 9.2 9.3  PHOS 7.4* 7.0* 4.4  --     Liver Function Tests: Recent Labs  Lab 09/18/17 1556 09/20/17 1225 09/22/17 1303 09/25/17 0743  AST  --   --   --  26  ALT  --   --   --  36  ALKPHOS  --   --   --  225*  BILITOT  --   --   --  0.5  PROT  --   --   --  7.5  ALBUMIN 3.1* 3.1* 3.0* 3.2*   No results for input(s): LIPASE, AMYLASE in the last 168 hours. No results for input(s): AMMONIA in the last 168 hours.  CBC: Recent Labs  Lab 09/18/17 1556 09/20/17 1225 09/22/17 1303 09/25/17 0743  WBC 5.8 5.7 6.8 7.6  NEUTROABS  --   --   --  5.7  HGB  9.8* 9.6* 9.5* 9.5*  HCT 32.1* 31.6* 32.0* 31.5*  MCV 78.2* 77.0* 79.2* 78.0*  PLT 284 257 241 255    Cardiac Enzymes: Recent Labs  Lab 09/25/17 0743  TROPONINI 0.10*    BNP: Invalid input(s): POCBNP  CBG: No results for input(s): GLUCAP in the last 168 hours.  Microbiology: Results for orders placed or performed during the hospital encounter of 07/12/17  Culture, blood (Routine X 2) w Reflex to ID Panel     Status: None   Collection Time: 07/12/17  4:43 PM  Result Value Ref Range Status   Specimen Description BLOOD LINE CENTRAL  Final   Special Requests   Final    BOTTLES DRAWN AEROBIC AND ANAEROBIC Blood Culture adequate volume   Culture   Final    NO GROWTH 5 DAYS Performed at [facility], [address] [place], [place] [address]    Report Status 07/17/2017 FINAL  Final    Coagulation Studies: Recent Labs    09/25/17 0743  LABPROT 14.1  INR 1.10    Urinalysis: No results for input(s): COLORURINE, LABSPEC, PHURINE,  GLUCOSEU, HGBUR, BILIRUBINUR, KETONESUR, PROTEINUR, UROBILINOGEN, NITRITE, LEUKOCYTESUR in the last 72 hours.  Invalid input(s): APPERANCEUR    Imaging: Dg Chest Portable 1 View  Result Date: 09/25/2017 CLINICAL DATA:  Shortness of Breath EXAM: PORTABLE CHEST 1 VIEW COMPARISON:  July 10, 2017 FINDINGS: Central catheter tip is in the superior vena cava. No pneumothorax. There is cardiomegaly with pulmonary venous hypertension. There is slight interstitial edema. There is no consolidation. No appreciable pleural effusion. There is no adenopathy. There is aortic atherosclerosis. No evident bone lesions. IMPRESSION: Pulmonary vascular congestion with mild edema. No consolidation. There is aortic atherosclerosis. Central catheter tip in superior vena cava. No pneumothorax. Aortic Atherosclerosis (ICD10-I70.0). Electronically Signed   By: Lowella Grip III M.D.   On: 09/25/2017 08:02     Medications:   . albumin human     .  Chlorhexidine Gluconate Cloth  6 each Topical Q0600  . hydrALAZINE  20 mg Intravenous Once     Assessment/ Plan:  57 y.o. black male withend stage renal disease on hemodialysis secondary to Alport's syndrome, ascites, hypertension, anemia of chronic kidney disease, coronary artery disease, peripheral vascular disease, hyperlipidemia, gastrointestinal AVMs, pulmonary hypertension  nooutpatientdialysis unit currently  1. Hyperkalemia:   - 1K bath on hemodialysis - Low Potassium diet  2.  End-stage renal disease:   MWF - Placed on hemodialysis today. Tolerating treatment well.   3. Ascites:  - Schedule large volume ultrasound guided paracentesis with IV albumin.   4.  Anemia of CKD:    - EPO with HD treatment    5.  Secondary hyperparathyroidism: phosphorus and calcium at goal.  - low phos diet  6. Dialysis access:  - status post Vein mapping - follow up with vascular surgery       LOS: 0 Stanley [name] 7/15/20199:18 AM

## 2017-09-25 NOTE — ED Notes (Signed)
Pt denies SOB at this time. Denies pain. Talking in complete sentences. States he feels much better now that he has been to dialysis. Denies missing dialysis. Told this RN he is scheduled for outpatient paracentesis and would like to be DC so that he can go get that done. Asked for meal tray, Dr. Jimmye Norman said pt can eat. Given [place] sandwich tray and ginger ale to drink.

## 2017-09-25 NOTE — Progress Notes (Signed)
HD Started

## 2017-09-27 ENCOUNTER — Emergency Department
Admission: EM | Admit: 2017-09-27 | Discharge: 2017-09-27 | Disposition: A | Discharge status: Home / Self Care | Payer: Medicare Other | Attending: Emergency Medicine | Admitting: Emergency Medicine

## 2017-09-27 ENCOUNTER — Encounter: Payer: Self-pay | Admitting: Emergency Medicine

## 2017-09-27 ENCOUNTER — Other Ambulatory Visit: Payer: Self-pay

## 2017-09-27 DIAGNOSIS — Z992 Dependence on renal dialysis: Secondary | ICD-10-CM | POA: Insufficient documentation

## 2017-09-27 DIAGNOSIS — I2510 Atherosclerotic heart disease of native coronary artery without angina pectoris: Secondary | ICD-10-CM | POA: Insufficient documentation

## 2017-09-27 DIAGNOSIS — D649 Anemia, unspecified: Secondary | ICD-10-CM | POA: Diagnosis not present

## 2017-09-27 DIAGNOSIS — J449 Chronic obstructive pulmonary disease, unspecified: Secondary | ICD-10-CM | POA: Diagnosis not present

## 2017-09-27 DIAGNOSIS — I120 Hypertensive chronic kidney disease with stage 5 chronic kidney disease or end stage renal disease: Secondary | ICD-10-CM | POA: Diagnosis not present

## 2017-09-27 DIAGNOSIS — N2581 Secondary hyperparathyroidism of renal origin: Secondary | ICD-10-CM | POA: Diagnosis not present

## 2017-09-27 DIAGNOSIS — E875 Hyperkalemia: Secondary | ICD-10-CM | POA: Diagnosis not present

## 2017-09-27 DIAGNOSIS — N186 End stage renal disease: Secondary | ICD-10-CM

## 2017-09-27 DIAGNOSIS — Z79899 Other long term (current) drug therapy: Secondary | ICD-10-CM | POA: Diagnosis not present

## 2017-09-27 DIAGNOSIS — I509 Heart failure, unspecified: Secondary | ICD-10-CM | POA: Insufficient documentation

## 2017-09-27 DIAGNOSIS — F17210 Nicotine dependence, cigarettes, uncomplicated: Secondary | ICD-10-CM | POA: Diagnosis not present

## 2017-09-27 DIAGNOSIS — I132 Hypertensive heart and chronic kidney disease with heart failure and with stage 5 chronic kidney disease, or end stage renal disease: Secondary | ICD-10-CM | POA: Diagnosis not present

## 2017-09-27 LAB — CBC
HCT: 31.1 % — ABNORMAL LOW (ref 40.0–52.0)
Hemoglobin: 9.3 g/dL — ABNORMAL LOW (ref 13.0–18.0)
MCH: 23.2 pg — ABNORMAL LOW (ref 26.0–34.0)
MCHC: 29.9 g/dL — ABNORMAL LOW (ref 32.0–36.0)
MCV: 77.6 fL — ABNORMAL LOW (ref 80.0–100.0)
Platelets: 287 K/uL (ref 150–440)
RBC: 4 MIL/uL — ABNORMAL LOW (ref 4.40–5.90)
RDW: 20.1 % — ABNORMAL HIGH (ref 11.5–14.5)
WBC: 7 K/uL (ref 3.8–10.6)

## 2017-09-27 LAB — RENAL FUNCTION PANEL
ALBUMIN: 2.8 g/dL — AB (ref 3.5–5.0)
ANION GAP: 10 (ref 5–15)
BUN: 62 mg/dL — ABNORMAL HIGH (ref 6–20)
CALCIUM: 8.3 mg/dL — AB (ref 8.9–10.3)
CO2: 23 mmol/L (ref 22–32)
CREATININE: 9.13 mg/dL — AB (ref 0.61–1.24)
Chloride: 107 mmol/L (ref 98–111)
GFR calc Af Amer: 6 mL/min — ABNORMAL LOW (ref >60)
GFR calc non Af Amer: 6 mL/min — ABNORMAL LOW (ref >60)
GLUCOSE: 126 mg/dL — AB (ref 70–99)
PHOSPHORUS: 5.4 mg/dL — AB (ref 2.5–4.6)
Potassium: 6.5 mmol/L (ref 3.5–5.1)
SODIUM: 140 mmol/L (ref 135–145)

## 2017-09-27 MED ORDER — EPOETIN ALFA 10000 UNIT/ML IJ SOLN
10000.0000 [IU] | Freq: Once | INTRAMUSCULAR | Status: AC
  Administered 2017-09-27: 10000 [IU] via INTRAVENOUS

## 2017-09-27 MED ORDER — DIPHENHYDRAMINE HCL 50 MG/ML IJ SOLN
25.0000 mg | Freq: Once | INTRAMUSCULAR | Status: AC
  Administered 2017-09-27: 25 mg via INTRAVENOUS

## 2017-09-27 MED ORDER — CHLORHEXIDINE GLUCONATE CLOTH 2 % EX PADS: 6.0000 | MEDICATED_PAD | Freq: Every day | CUTANEOUS | Status: DC

## 2017-09-27 NOTE — Progress Notes (Signed)
[facility]  ROUNDING NOTE   Subjective:   Seen and examined on hemodialysis. Tolerating dialysis treatment well.   Large volume paracentesis on 7/15 with 6352mL removed.   Patient is complaining of sutures causing pain.   Objective:  Vital signs in last 24 hours:  Temp:  [98.4 F (36.9 C)] 98.4 F (36.9 C) (07/17 1146) Pulse Rate:  [88] 88 (07/17 1146) Resp:  [20] 20 (07/17 1146) BP: (117)/(70) 117/70 (07/17 1146) SpO2:  [96 %] 96 % (07/17 1146) Weight:  [77.1 kg (170 lb)] 77.1 kg (170 lb) (07/17 1147)  Weight change:  Filed Weights   09/27/17 1147  Weight: 77.1 kg (170 lb)    Intake/Output: No intake/output data recorded.   Intake/Output this shift:  No intake/output data recorded.  Physical Exam: General: No acute distress  Head: Normocephalic, atraumatic. Moist oral mucosal membranes  Eyes: Anicteric  Neck: Supple, trachea midline  Lungs:  Clear to auscultation, normal effort  Heart: S1S2 no rubs  Abdomen:  + distended  Extremities: no peripheral edema.  Neurologic: nonfocal  Skin: No lesions  Access: R IJ permcath    Basic Metabolic Panel: Recent Labs  Lab 09/22/17 1303 09/25/17 0743 09/25/17 1318  NA 138 141 141  K 6.7* 7.2* 5.6*  CL 104 104 104  CO2 24 25 27   GLUCOSE 131* 74 141*  BUN 46* 50* 28*  CREATININE 7.99* 8.97* 5.55*  CALCIUM 9.2 9.3 8.3*  PHOS 4.4  --   --     Liver Function Tests: Recent Labs  Lab 09/22/17 1303 09/25/17 0743  AST  --  26  ALT  --  36  ALKPHOS  --  225*  BILITOT  --  0.5  PROT  --  7.5  ALBUMIN 3.0* 3.2*   No results for input(s): LIPASE, AMYLASE in the last 168 hours. No results for input(s): AMMONIA in the last 168 hours.  CBC: Recent Labs  Lab 09/22/17 1303 09/25/17 0743  WBC 6.8 7.6  NEUTROABS  --  5.7  HGB 9.5* 9.5*  HCT 32.0* 31.5*  MCV 79.2* 78.0*  PLT 241 255    Cardiac Enzymes: Recent Labs  Lab 09/25/17 0743 09/25/17 1318  TROPONINI 0.10* 0.08*    BNP: Invalid  input(s): POCBNP  CBG: No results for input(s): GLUCAP in the last 168 hours.  Microbiology: Results for orders placed or performed during the hospital encounter of 07/12/17  Culture, blood (Routine X 2) w Reflex to ID Panel     Status: None   Collection Time: 07/12/17  4:43 PM  Result Value Ref Range Status   Specimen Description BLOOD LINE CENTRAL  Final   Special Requests   Final    BOTTLES DRAWN AEROBIC AND ANAEROBIC Blood Culture adequate volume   Culture   Final    NO GROWTH 5 DAYS Performed at [facility], [address], [place], [place] [address]    Report Status 07/17/2017 FINAL  Final    Coagulation Studies: Recent Labs    09/25/17 0743  LABPROT 14.1  INR 1.10    Urinalysis: No results for input(s): COLORURINE, LABSPEC, PHURINE, GLUCOSEU, HGBUR, BILIRUBINUR, KETONESUR, PROTEINUR, UROBILINOGEN, NITRITE, LEUKOCYTESUR in the last 72 hours.  Invalid input(s): APPERANCEUR    Imaging: US Paracentesis  Result Date: 09/25/2017 INDICATION: 57 year old male with cirrhosis and recurrent large volume ascites. EXAM: ULTRASOUND GUIDED  PARACENTESIS MEDICATIONS: None. COMPLICATIONS: None immediate. PROCEDURE: Informed written consent was obtained from the patient after a discussion of the risks, benefits  and alternatives to treatment. A timeout was performed prior to the initiation of the procedure. Initial ultrasound scanning demonstrates a large amount of ascites within the right lower abdominal quadrant. The right lower abdomen was prepped and draped in the usual sterile fashion. 1% lidocaine with epinephrine was used for local anesthesia. Following this, a 6 Fr Safe-T-Centesis catheter was introduced. An ultrasound image was saved for documentation purposes. The paracentesis was performed. The catheter was removed and a dressing was applied. The patient tolerated the procedure well without immediate post procedural complication. FINDINGS: A total of approximately 6350  mL of clear yellow ascitic fluid was removed. IMPRESSION: Successful ultrasound-guided paracentesis yielding 6.35 liters of peritoneal fluid. Electronically Signed   By: Jacqulynn Cadet M.D.   On: 09/25/2017 16:09     Medications:    . [START ON 09/28/2017] Chlorhexidine Gluconate Cloth  6 each Topical Q0600  . diphenhydrAMINE  25 mg Intravenous Once  . epoetin (EPOGEN/PROCRIT) injection  10,000 Units Intravenous Once     Assessment/ Plan:  56 y.o. black male withend stage renal disease on hemodialysis secondary to Alport's syndrome, ascites, hypertension, anemia of chronic kidney disease, coronary artery disease, peripheral vascular disease, hyperlipidemia, gastrointestinal AVMs, pulmonary hypertension  nooutpatientdialysis unit currently  1. Hyperkalemia:   - 2K bath on hemodialysis - Low Potassium diet  2.  End-stage renal disease:   MWF - Seen on hemodialysis today. Tolerating treatment well.  - Remove sutures from permcath  3. Ascites: status post large volume ultrasound guided paracentesis on 7/15.   4.  Anemia of CKD:    - EPO with HD treatment    5.  Secondary hyperparathyroidism: phosphorus and calcium at goal.  - low phos diet  6. Dialysis access:  - status post Vein mapping - follow up with vascular surgery       LOS: 0 Salem Lembke 7/17/20191:03 PM

## 2017-09-27 NOTE — Progress Notes (Signed)
Post HD assessment, no change in status from baseline, pt tolerated tx well, d/c stable to ED.    09/27/17 1615  Neurological  Level of Consciousness Alert  Orientation Level Oriented X4  Respiratory  Respiratory Pattern Regular;Labored  Chest Assessment Chest expansion symmetrical  Cough None  Cardiac  Pulse Regular  Heart Sounds S1, S2  ECG Monitor Yes  Vascular  R Radial Pulse +2  L Radial Pulse +2  Edema Generalized  Psychosocial  Psychosocial (WDL) WDL

## 2017-09-27 NOTE — ED Provider Notes (Signed)
[facility] Emergency Department Provider Note   ____________________________________________   First MD Initiated Contact with Patient 09/27/17 1228     (approximate)  I have reviewed the triage vital signs and the nursing notes.   HISTORY  Chief Complaint Dialysis    HPI Stanley [name] is a 57 y.o. male history congestive heart failure, cirrhosis coronary disease and end-stage renal disease  Patient reports he is here for his routine dialysis.  Denies any complaints or concerns.  Specifically no chest pain no shortness of breath.  Feels like he is doing well.  No swelling.  Reports that he seems to be doing well and has no concerns.  He would like something to drink and understands a dialysis will be ready for him shortly.   Past Medical History:  Diagnosis Date  . Alcohol abuse   . CHF (congestive heart failure) ([place])   . Cirrhosis ([place])   . Coronary artery disease 2009  . Drug abuse ([place])   . End stage renal disease on dialysis [facility]) NEPHROLOGIST-   DR [facility]  IN [place]   HEMODIALYSIS --   TUES/  THURS/  SAT  . Gastrointestinal bleed 06/13/2017   From chart...hx of multiple GI bleeds  . GERD (gastroesophageal reflux disease)   . Hyperlipidemia   . Hypertension   . PAD (peripheral artery disease) ([place])   . Renal insufficiency    Per pt, 32 oz fluid restriction per day  . S/P triple vessel bypass 06/09/2016   2009ish  . Suicidal ideation    & HOMICIDAL IDEATION --  06-16-2013   ADMITTED TO BEHAVIOR HEALTH    Patient Active Problem List   Diagnosis Date Noted  . ESRD (end stage renal disease) on dialysis ([place]) 07/28/2017  . Protein-calorie malnutrition, severe 06/14/2017  . Encounter for dialysis [facility])   . Palliative care by specialist   . Goals of care, counseling/discussion   . Malnutrition of moderate degree 06/05/2017  . Secondary esophageal varices without bleeding ([place])   . Stomach irritation   . Idiopathic esophageal  varices without bleeding ([place])   . Alcoholic hepatitis with ascites 05/24/2017  . ESRD (end stage renal disease) ([place]) 04/28/2017  . Uremia 03/08/2017  . ESRD on hemodialysis ([place]) 03/03/2017  . Weakness 02/28/2017  . Hypocalcemia 02/22/2017  . Shortness of breath 11/26/2016  . COPD (chronic obstructive pulmonary disease) ([place]) 10/30/2016  . COPD exacerbation ([place]) 10/29/2016  . Anemia   . Heme positive stool   . Ulceration of intestine   . Benign neoplasm of transverse colon   . Acute gastrointestinal hemorrhage   . Esophageal candidiasis ([place])   . Angiodysplasia of intestinal tract   . Acute respiratory failure with hypoxia ([place]) 07/03/2016  . GI bleeding 06/24/2016  . Rectal bleeding 06/14/2016  . Anemia of chronic disease 06/01/2016  . MRSA carrier 06/01/2016  . Chronic renal failure 05/23/2016  . Ischemic heart disease 05/23/2016  . Angiodysplasia of small intestine   . Melena   . Small bowel bleed not requiring more than 4 units of blood in 24 hours, ICU, or surgery   . Anemia due to chronic blood loss   . Abdominal pain 05/05/2016  . Acute posthemorrhagic anemia 04/17/2016  . Gastrointestinal bleed 04/17/2016  . History of esophagogastroduodenoscopy (EGD) 04/17/2016  . Elevated troponin 04/17/2016  . Alcohol abuse 04/17/2016  . Upper GI bleed 01/19/2016  . Blood in stool   . Angiodysplasia of stomach and duodenum with hemorrhage   .  Gastritis   . Reflux esophagitis   . GI bleed 05/16/2015  . Acute GI bleeding   . Symptomatic anemia 04/30/2015  . HTN (hypertension) 04/06/2015  . GERD (gastroesophageal reflux disease) 04/06/2015  . HLD (hyperlipidemia) 04/06/2015  . Dyspnea 04/06/2015  . Cirrhosis of liver with ascites ([place]) 04/06/2015  . Ascites 04/06/2015  . GIB (gastrointestinal bleeding) 03/23/2015  . Homicidal ideation 06/19/2013  . Suicidal intent 06/19/2013  . Homicidal ideations 06/19/2013  . Hyperkalemia 06/16/2013  . Mandible fracture ([place])  06/05/2013  . Fracture, mandible ([place]) 06/02/2013  . Coronary atherosclerosis of native coronary artery 06/02/2013  . ESRD on dialysis ([place]) 06/02/2013  . Mandible open fracture ([place]) 06/02/2013    Past Surgical History:  Procedure Laterality Date  . A/V FISTULAGRAM Right 06/06/2017   Procedure: A/V FISTULAGRAM;  Surgeon: Katha Cabal, MD;  Location: [place] CV LAB;  Service: Cardiovascular;  Laterality: Right;  . A/V SHUNT INTERVENTION N/A 06/06/2017   Procedure: A/V SHUNT INTERVENTION;  Surgeon: Katha Cabal, MD;  Location: [place] CV LAB;  Service: Cardiovascular;  Laterality: N/A;  . AGILE CAPSULE N/A 06/19/2016   Procedure: AGILE CAPSULE;  Surgeon: [name], MD;  Location: [facility] ENDOSCOPY;  Service: Endoscopy;  Laterality: N/A;  . COLONOSCOPY WITH PROPOFOL N/A 06/18/2016   Procedure: COLONOSCOPY WITH PROPOFOL;  Surgeon: [name], MD;  Location: [facility] ENDOSCOPY;  Service: Endoscopy;  Laterality: N/A;  . COLONOSCOPY WITH PROPOFOL N/A 08/12/2016   Procedure: COLONOSCOPY WITH PROPOFOL;  Surgeon: [name], MD;  Location: [facility] ENDOSCOPY;  Service: Endoscopy;  Laterality: N/A;  . COLONOSCOPY WITH PROPOFOL N/A 05/05/2017   Procedure: COLONOSCOPY WITH PROPOFOL;  Surgeon: Manya Silvas, MD;  Location: [facility] ENDOSCOPY;  Service: Endoscopy;  Laterality: N/A;  . CORONARY ANGIOPLASTY  ?   PT UNABLE TO TELL IF  BEFORE OR AFTER  CABG  . CORONARY ARTERY BYPASS GRAFT  2008  ([place] , [place])   3 VESSEL  . DIALYSIS FISTULA CREATION  LAST SURGERY  APPOX  2008  . ENTEROSCOPY N/A 05/10/2016   Procedure: ENTEROSCOPY;  Surgeon: Jerene Bears, MD;  Location: [place];  Service: Gastroenterology;  Laterality: N/A;  . ENTEROSCOPY N/A 08/12/2016   Procedure: ENTEROSCOPY;  Surgeon: [name], MD;  Location: [facility] ENDOSCOPY;  Service: Endoscopy;  Laterality: N/A;  . ENTEROSCOPY Left 06/03/2017   Procedure: ENTEROSCOPY;  Surgeon: Virgel Manifold, MD;  Location: [facility] ENDOSCOPY;   Service: Endoscopy;  Laterality: Left;  Procedure date will ultimately depend on when patient is medically optimized before the procedure, pending hemodialysis and blood transfusions etc. Will place on schedule and change depending on clinical status.   . ENTEROSCOPY N/A 06/05/2017   Procedure: ENTEROSCOPY;  Surgeon: Virgel Manifold, MD;  Location: [facility] ENDOSCOPY;  Service: Endoscopy;  Laterality: N/A;  . ENTEROSCOPY N/A 06/15/2017   Procedure: Push ENTEROSCOPY;  Surgeon: [name], MD;  Location: [facility] ENDOSCOPY;  Service: Endoscopy;  Laterality: N/A;  . ESOPHAGOGASTRODUODENOSCOPY N/A 05/07/2015   Procedure: ESOPHAGOGASTRODUODENOSCOPY (EGD);  Surgeon: Hulen Luster, MD;  Location: [facility] ENDOSCOPY;  Service: Endoscopy;  Laterality: N/A;  . ESOPHAGOGASTRODUODENOSCOPY (EGD) WITH PROPOFOL N/A 05/17/2015   Procedure: ESOPHAGOGASTRODUODENOSCOPY (EGD) WITH PROPOFOL;  Surgeon: [name], MD;  Location: [facility] ENDOSCOPY;  Service: Endoscopy;  Laterality: N/A;  . ESOPHAGOGASTRODUODENOSCOPY (EGD) WITH PROPOFOL N/A 01/20/2016   Procedure: ESOPHAGOGASTRODUODENOSCOPY (EGD) WITH PROPOFOL;  Surgeon: [name], MD;  Location: [facility] ENDOSCOPY;  Service: Endoscopy;  Laterality: N/A;  . ESOPHAGOGASTRODUODENOSCOPY (EGD) WITH PROPOFOL N/A 04/17/2016   Procedure:  ESOPHAGOGASTRODUODENOSCOPY (EGD) WITH PROPOFOL;  Surgeon: Lin Landsman, MD;  Location[facility] ENDOSCOPY;  Service: Gastroenterology;  Laterality: N/A;  . ESOPHAGOGASTRODUODENOSCOPY (EGD) WITH PROPOFOL  05/09/2016   Procedure: ESOPHAGOGASTRODUODENOSCOPY (EGD) WITH PROPOFOL;  Surgeon: Jerene Bears, MD;  Location: [place];  Service: Endoscopy;;  . ESOPHAGOGASTRODUODENOSCOPY (EGD) WITH PROPOFOL N/A 06/16/2016   Procedure: ESOPHAGOGASTRODUODENOSCOPY (EGD) WITH PROPOFOL;  Surgeon: [name], MD;  Location: [facility] ENDOSCOPY;  Service: Endoscopy;  Laterality: N/A;  . ESOPHAGOGASTRODUODENOSCOPY (EGD) WITH PROPOFOL N/A 05/05/2017   Procedure: ESOPHAGOGASTRODUODENOSCOPY  (EGD) WITH PROPOFOL;  Surgeon: Manya Silvas, MD;  Location: [facility] ENDOSCOPY;  Service: Endoscopy;  Laterality: N/A;  . ESOPHAGOGASTRODUODENOSCOPY (EGD) WITH PROPOFOL N/A 06/15/2017   Procedure: ESOPHAGOGASTRODUODENOSCOPY (EGD) WITH PROPOFOL;  Surgeon: [name], MD;  Location: [facility] ENDOSCOPY;  Service: Endoscopy;  Laterality: N/A;  . GIVENS CAPSULE STUDY N/A 05/07/2016   Procedure: GIVENS CAPSULE STUDY;  Surgeon: Doran Stabler, MD;  Location: [place];  Service: Endoscopy;  Laterality: N/A;  . MANDIBULAR HARDWARE REMOVAL N/A 07/29/2013   Procedure: REMOVAL OF ARCH BARS;  Surgeon: Theodoro Kos, DO;  Location: [place];  Service: Plastics;  Laterality: N/A;  . ORIF MANDIBULAR FRACTURE N/A 06/05/2013   Procedure: REPAIR OF MANDIBULAR FRACTURE x 2 with maxillo-mandibular fixation ;  Surgeon: Theodoro Kos, DO;  Location: [place];  Service: Plastics;  Laterality: N/A;  . PERIPHERAL ARTERIAL STENT GRAFT Left     Prior to Admission medications   Medication Sig Start Date End Date Taking? Authorizing Provider  albuterol (PROVENTIL HFA;VENTOLIN HFA) 108 (90 Base) MCG/ACT inhaler Inhale 2 puffs into the lungs every 6 (six) hours as needed for wheezing or shortness of breath. 04/21/17   Lisa Roca, MD  alum & mag hydroxide-simeth (MAALOX/MYLANTA) 200-200-20 MG/5ML suspension Take 15 mLs every 4 (four) hours as needed by mouth for indigestion or heartburn. Patient not taking: Reported on 04/19/2017 01/25/17   Demetrios Loll, MD  atorvastatin (LIPITOR) 40 MG tablet Take 1 tablet (40 mg total) by mouth daily. 11/16/16   Vaughan Basta, MD  budesonide-formoterol (SYMBICORT) 160-4.5 MCG/ACT inhaler Inhale 2 puffs into the lungs daily. 11/16/16   Vaughan Basta, MD  calcium acetate (PHOSLO) 667 MG capsule Take 2,001 mg by mouth 3 (three) times daily.    [provider]  doxycycline (VIBRA-TABS) 100 MG tablet Take 1 tablet (100 mg total) by mouth 2 (two) times  daily. Patient not taking: Reported on 08/28/2017 07/10/17   Harvest Dark, MD  ferrous sulfate 325 (65 FE) MG tablet Take 1 tablet by mouth 3 (three) times daily. 01/21/17   [provider]  furosemide (LASIX) 80 MG tablet Take 1 tablet (80 mg total) daily by mouth. 01/25/17   Demetrios Loll, MD  gabapentin (NEURONTIN) 300 MG capsule Take 1 capsule by mouth daily as directed 08/16/16   [provider]  ipratropium-albuterol (DUONEB) 0.5-2.5 (3) MG/3ML SOLN Take 3 mLs by nebulization every 6 (six) hours as needed. 11/28/16   Loletha Grayer, MD  labetalol (NORMODYNE) 100 MG tablet Take 100 mg by mouth daily. 03/24/17   [provider]  Multiple Vitamins-Minerals-FA (DIALYVITE SUPREME D) 3 MG TABS Take 1 tablet (3 mg total) by mouth daily. 11/28/16   Loletha Grayer, MD  nitroGLYCERIN (NITROSTAT) 0.4 MG SL tablet Place 1 tablet (0.4 mg total) under the tongue every 5 (five) minutes as needed. 04/12/16   Wende Bushy, MD  pantoprazole (PROTONIX) 40 MG tablet Take 1 tablet (40 mg total) by mouth daily. 11/28/16  Loletha Grayer, MD  Spacer/Aero Chamber Mouthpiece MISC 1 Units by Does not apply route every 4 (four) hours as needed (wheezing). 02/19/17   Darel Hong, MD  tiotropium (SPIRIVA HANDIHALER) 18 MCG inhalation capsule Place 1 capsule (18 mcg total) into inhaler and inhale daily. 11/16/16 11/16/17  Vaughan Basta, MD    Allergies Patient has no known allergies.  Family History  Problem Relation Age of Onset  . Colon cancer Mother   . Cancer Father   . Cancer Sister   . Kidney disease Brother     Social History Social History   Tobacco Use  . Smoking status: Current Every Day Smoker    Packs/day: 0.15    Years: 40.00    Pack years: 6.00    Types: Cigarettes  . Smokeless tobacco: Never Used  Substance Use Topics  . Alcohol use: No    Comment: pt reports quitting after learning about cirrhosis  . Drug use: No    Frequency: 7.0 times per week     Types: Marijuana, Cocaine    Review of Systems Constitutional: No fever/chills Cardiovascular: Denies chest pain. Respiratory: Denies shortness of breath. Gastrointestinal: No abdominal pain.  No nausea, no vomiting.  Reports she is hungry Skin: Negative for rash. Neurological: Negative for headaches or weakness.      ____________________________________________   PHYSICAL EXAM:  VITAL SIGNS: ED Triage Vitals  Enc Vitals Group     BP 09/27/17 1146 117/70     Pulse Rate 09/27/17 1146 88     Resp 09/27/17 1146 20     Temp 09/27/17 1146 98.4 F (36.9 C)     Temp Source 09/27/17 1146 Oral     SpO2 09/27/17 1146 96 %     Weight 09/27/17 1147 170 lb (77.1 kg)     Height 09/27/17 1147 6\' 3"  (1.905 m)     Head Circumference --      Peak Flow --      Pain Score 09/27/17 1147 0     Pain Loc --      Pain Edu? --      Excl. in [place]? --     Constitutional: Alert and oriented. Well appearing and in no acute distress. Head: Atraumatic Mouth/Throat: Mucous membranes are moist. Neck: No stridor.  Clear speech. Cardiovascular: Normal rate, regular rhythm. Grossly normal heart sounds.  Good peripheral circulation. Respiratory: Normal respiratory effort.  No retractions. Lungs CTAB. Musculoskeletal: No lower extremity tenderness nor edema. Neurologic:  Normal speech and language. No gross focal neurologic deficits are appreciated.  Skin:  Skin is warm, dry and intact. No rash noted. Psychiatric: Mood and affect are normal. Speech and behavior are normal.  ____________________________________________   LABS (all labs ordered are listed, but only abnormal results are displayed)  Labs Reviewed  RENAL FUNCTION PANEL - Abnormal; Notable for the following components:      Result Value   Potassium 6.5 (*)    Glucose, Bld 126 (*)    BUN 62 (*)    Creatinine, Ser 9.13 (*)    Calcium 8.3 (*)    Phosphorus 5.4 (*)    Albumin 2.8 (*)    GFR calc non Af Amer 6 (*)    GFR calc Af Amer  6 (*)    All other components within normal limits  CBC - Abnormal; Notable for the following components:   RBC 4.00 (*)    Hemoglobin 9.3 (*)    HCT 31.1 (*)    MCV 77.6 (*)  MCH 23.2 (*)    MCHC 29.9 (*)    RDW 20.1 (*)    All other components within normal limits   ____________________________________________  EKG   ____________________________________________  RADIOLOGY   ____________________________________________   PROCEDURES  Procedure(s) performed: None  Procedures  Critical Care performed: No  ____________________________________________   INITIAL IMPRESSION / ASSESSMENT AND PLAN / ED COURSE  Pertinent labs & imaging results that were available during my care of the patient were reviewed by me and considered in my medical decision making (see chart for details).  Case and care discussed with Dr. Juleen China.  He will place orders and have hemodialysis performed today.  Patient has no complaints, no indication for lab testing noted in the ER at this time.  Further care as recommended by nephrology team.  Anticipate the patient will be discharged, patient understands to come back for his next dialysis session as well Friday.    ----------------------------------------- 3:20 PM on 09/27/2017 -----------------------------------------  Ongoing care assigned to Dr. Archie Balboa.  Patient currently in dialysis, likely discharged home thereafter if no problems identified during visit/HD today.  Next dialysis anticipated this Friday  ____________________________________________   FINAL CLINICAL IMPRESSION(S) / ED DIAGNOSES  Final diagnoses:  ESRD (end stage renal disease) on dialysis ([place])      NEW MEDICATIONS STARTED DURING THIS VISIT:  Current Discharge Medication List       Note:  This document was prepared using Dragon voice recognition software and may include unintentional dictation errors.     Delman Kitten, MD 09/27/17 [phone]

## 2017-09-27 NOTE — Progress Notes (Signed)
Post HD Tx    09/27/17 1605  Vital Signs  Temp 97.8 F (36.6 C)  Temp Source Oral  Pulse Rate 83  Pulse Rate Source Monitor  Resp 16  BP 140/61  BP Location Left Arm  BP Method Automatic  Patient Position (if appropriate) Sitting  Oxygen Therapy  SpO2 100 %  O2 Device Room Air  Pulse Oximetry Type Continuous  Pain Assessment  Pain Scale 0-10  Pain Score 0  Dialysis Weight  Weight 73 kg (160 lb 15 oz)  Type of Weight Post-Dialysis  Post-Hemodialysis Assessment  Rinseback Volume (mL) 250 mL  KECN 66.5 V  Dialyzer Clearance Lightly streaked  Duration of HD Treatment -hour(s) 3 hour(s)  Hemodialysis Intake (mL) 500 mL  UF Total -Machine (mL) 4521 mL  Net UF (mL) 4021 mL  Tolerated HD Treatment Yes  Hemodialysis Catheter Right Internal jugular Double-lumen  No Placement Date or Time found.   Placed prior to admission: Yes  Orientation: Right  Access Location: Internal jugular  Hemodialysis Catheter Type: Double-lumen  Site Condition No complications  Blue Lumen Status Heparin locked  Red Lumen Status Heparin locked  Post treatment catheter status Capped and Clamped

## 2017-09-27 NOTE — Progress Notes (Signed)
Pre HD Assessment    09/27/17 1235  Neurological  Level of Consciousness Alert  Orientation Level Oriented X4  Respiratory  Respiratory Pattern Regular;Labored  Chest Assessment Chest expansion symmetrical  Cough None  Cardiac  Pulse Regular  Heart Sounds S1, S2  ECG Monitor Yes  Vascular  R Radial Pulse +2  L Radial Pulse +2  Edema Generalized  Psychosocial  Psychosocial (WDL) WDL

## 2017-09-27 NOTE — Progress Notes (Signed)
HD Tx completed, tolerated well, UF 4041mL   09/27/17 1600  Vital Signs  Pulse Rate 85  Resp 20  BP 128/80  BP Location Left Arm  BP Method Automatic  Patient Position (if appropriate) Sitting  Oxygen Therapy  SpO2 100 %  O2 Device Room Air  Pulse Oximetry Type Continuous  During Hemodialysis Assessment  HD Safety Checks Performed Yes  KECN 66.5 KECN  Dialysis Fluid Bolus Normal Saline  Bolus Amount (mL) 250 mL  Intra-Hemodialysis Comments Tx completed;Tolerated well

## 2017-09-27 NOTE — Progress Notes (Signed)
HD Tx started w/o complication

## 2017-09-27 NOTE — ED Triage Notes (Signed)
Pt presents to ED for scheduled dialysis. Denies any new complaints, NAD noted at this time.

## 2017-09-27 NOTE — Progress Notes (Signed)
Pre HD    09/27/17 1228  Pain Assessment  Pain Scale 0-10  Pain Score 0  Time-Out for Hemodialysis  What Procedure? HD  Pt Identifiers(min of two) First/Last Name;MRN/Account#  Correct Site? Yes  Correct Side? Yes  Correct Procedure? Yes  Consents Verified? Yes  Rad Studies Available? N/A  Safety Precautions Reviewed? Yes  Machine [employer] Number [id number]  Station Number 2  UF/Alarm Test Passed  Conductivity: Meter 14  Conductivity: Machine  14.1  pH 7.4  Reverse Osmosis Main  Normal Saline Lot Number [id number]  Dialyzer Lot Number [id number]  Disposable Set Lot Number [id number]  Machine Temperature 98.6 F (37 C)  Musician and Audible Yes  Blood Lines Intact and Secured Yes  Pre Treatment Patient Checks  Vascular access used during treatment Catheter  Patient is receiving dialysis in a chair Yes  Hepatitis B Surface Antigen Results Negative  Date Hepatitis B Surface Antigen Drawn 06/16/17  Hepatitis B Surface Antibody  (>10)  Date Hepatitis B Surface Antibody Drawn 06/16/17  Hemodialysis Consent Verified Yes  Hemodialysis Standing Orders Initiated Yes  ECG (Telemetry) Monitor On Yes  Prime Ordered Normal Saline  Length of  DialysisTreatment -hour(s) 3 Hour(s)  Dialysis Treatment Comments Na 140  Dialyzer Elisio 17H NR  Dialysate 2K, 2.5 Ca  Dialysis Anticoagulant None  Dialysate Flow Ordered 600  Blood Flow Rate Ordered 400 mL/min  Ultrafiltration Goal 2 Liters  Pre Treatment Labs CBC;Renal panel  Dialysis Blood Pressure Support Ordered Normal Saline

## 2017-09-27 NOTE — Progress Notes (Signed)
Pre HD Tx    09/27/17 1238  Vital Signs  Temp 97.9 F (36.6 C)  Temp Source Oral  Pulse Rate 87  Pulse Rate Source Monitor  Resp 19  BP 127/81  BP Location Left Arm  BP Method Automatic  Patient Position (if appropriate) Sitting  Oxygen Therapy  SpO2 100 %  O2 Device Room Air  Pulse Oximetry Type Continuous  Pain Assessment  Pain Scale 0-10  Pain Score 0  Education / Care Plan  Dialysis Education Provided Yes  Documented Education in Care Plan Yes  Hemodialysis Catheter Right Internal jugular Double-lumen  No Placement Date or Time found.   Placed prior to admission: Yes  Orientation: Right  Access Location: Internal jugular  Hemodialysis Catheter Type: Double-lumen  Site Condition No complications  Blue Lumen Status Flushed  Red Lumen Status Flushed  Purple Lumen Status N/A  Dressing Type Occlusive;Biopatch  Dressing Status Clean;Dry;Intact  Drainage Description None  Dressing Change Due 10/02/17

## 2017-09-29 ENCOUNTER — Emergency Department
Admission: EM | Admit: 2017-09-29 | Discharge: 2017-09-29 | Disposition: A | Discharge status: Home / Self Care | Payer: Medicare Other | Attending: Emergency Medicine | Admitting: Emergency Medicine

## 2017-09-29 ENCOUNTER — Other Ambulatory Visit: Payer: Self-pay

## 2017-09-29 DIAGNOSIS — J449 Chronic obstructive pulmonary disease, unspecified: Secondary | ICD-10-CM | POA: Insufficient documentation

## 2017-09-29 DIAGNOSIS — D631 Anemia in chronic kidney disease: Secondary | ICD-10-CM | POA: Diagnosis not present

## 2017-09-29 DIAGNOSIS — N186 End stage renal disease: Secondary | ICD-10-CM | POA: Insufficient documentation

## 2017-09-29 DIAGNOSIS — I132 Hypertensive heart and chronic kidney disease with heart failure and with stage 5 chronic kidney disease, or end stage renal disease: Secondary | ICD-10-CM | POA: Diagnosis not present

## 2017-09-29 DIAGNOSIS — Z951 Presence of aortocoronary bypass graft: Secondary | ICD-10-CM | POA: Insufficient documentation

## 2017-09-29 DIAGNOSIS — D649 Anemia, unspecified: Secondary | ICD-10-CM | POA: Diagnosis not present

## 2017-09-29 DIAGNOSIS — I2510 Atherosclerotic heart disease of native coronary artery without angina pectoris: Secondary | ICD-10-CM | POA: Insufficient documentation

## 2017-09-29 DIAGNOSIS — F17210 Nicotine dependence, cigarettes, uncomplicated: Secondary | ICD-10-CM | POA: Diagnosis not present

## 2017-09-29 DIAGNOSIS — F1010 Alcohol abuse, uncomplicated: Secondary | ICD-10-CM | POA: Diagnosis not present

## 2017-09-29 DIAGNOSIS — I509 Heart failure, unspecified: Secondary | ICD-10-CM | POA: Diagnosis not present

## 2017-09-29 DIAGNOSIS — Z992 Dependence on renal dialysis: Secondary | ICD-10-CM

## 2017-09-29 DIAGNOSIS — E875 Hyperkalemia: Secondary | ICD-10-CM | POA: Diagnosis not present

## 2017-09-29 DIAGNOSIS — I120 Hypertensive chronic kidney disease with stage 5 chronic kidney disease or end stage renal disease: Secondary | ICD-10-CM | POA: Diagnosis not present

## 2017-09-29 DIAGNOSIS — N2581 Secondary hyperparathyroidism of renal origin: Secondary | ICD-10-CM | POA: Diagnosis not present

## 2017-09-29 LAB — RENAL FUNCTION PANEL
ALBUMIN: 2.8 g/dL — AB (ref 3.5–5.0)
Anion gap: 10 (ref 5–15)
BUN: 50 mg/dL — ABNORMAL HIGH (ref 6–20)
CALCIUM: 8.6 mg/dL — AB (ref 8.9–10.3)
CO2: 25 mmol/L (ref 22–32)
CREATININE: 8.3 mg/dL — AB (ref 0.61–1.24)
Chloride: 104 mmol/L (ref 98–111)
GFR calc Af Amer: 7 mL/min — ABNORMAL LOW (ref >60)
GFR calc non Af Amer: 6 mL/min — ABNORMAL LOW (ref >60)
GLUCOSE: 117 mg/dL — AB (ref 70–99)
Phosphorus: 6 mg/dL — ABNORMAL HIGH (ref 2.5–4.6)
Potassium: 5.8 mmol/L — ABNORMAL HIGH (ref 3.5–5.1)
SODIUM: 139 mmol/L (ref 135–145)

## 2017-09-29 LAB — CBC
HCT: 31 % — ABNORMAL LOW (ref 40.0–52.0)
Hemoglobin: 9.6 g/dL — ABNORMAL LOW (ref 13.0–18.0)
MCH: 23.5 pg — AB (ref 26.0–34.0)
MCHC: 30.8 g/dL — AB (ref 32.0–36.0)
MCV: 76.3 fL — ABNORMAL LOW (ref 80.0–100.0)
PLATELETS: 300 10*3/uL (ref 150–440)
RBC: 4.06 MIL/uL — ABNORMAL LOW (ref 4.40–5.90)
RDW: 19.7 % — ABNORMAL HIGH (ref 11.5–14.5)
WBC: 7.4 10*3/uL (ref 3.8–10.6)

## 2017-09-29 MED ORDER — CHLORHEXIDINE GLUCONATE CLOTH 2 % EX PADS: 6.0000 | MEDICATED_PAD | Freq: Every day | CUTANEOUS | Status: DC

## 2017-09-29 MED ORDER — EPOETIN ALFA 10000 UNIT/ML IJ SOLN
10000.0000 [IU] | Freq: Once | INTRAMUSCULAR | Status: AC
  Administered 2017-09-29: 10000 [IU] via INTRAVENOUS
  Filled 2017-09-29: qty 1

## 2017-09-29 MED ORDER — DIPHENHYDRAMINE HCL 50 MG/ML IJ SOLN
25.0000 mg | Freq: Once | INTRAMUSCULAR | Status: AC
  Administered 2017-09-29: 25 mg via INTRAVENOUS

## 2017-09-29 NOTE — Progress Notes (Signed)
Hd completed 

## 2017-09-29 NOTE — ED Triage Notes (Signed)
Pt here for scheduled dialysis. No complaints. Pt alert and oriented X4, active, cooperative, pt in NAD. RR even and unlabored, color WNL.

## 2017-09-29 NOTE — Progress Notes (Signed)
[facility]  ROUNDING NOTE   Subjective:   Seen and examined on hemodialysis. Tolerating dialysis treatment well.   Objective:  Vital signs in last 24 hours:  Temp:  [98.7 F (37.1 C)] 98.7 F (37.1 C) (07/19 1147) Pulse Rate:  [80] 80 (07/19 1147) Resp:  [18] 18 (07/19 1147) BP: (131)/(74) 131/74 (07/19 1147) SpO2:  [100 %] 100 % (07/19 1147) Weight:  [77.1 kg (170 lb)] 77.1 kg (170 lb) (07/19 1147)  Weight change:  Filed Weights   09/29/17 1147  Weight: 77.1 kg (170 lb)    Intake/Output: No intake/output data recorded.   Intake/Output this shift:  No intake/output data recorded.  Physical Exam: General: No acute distress  Head: Normocephalic, atraumatic. Moist oral mucosal membranes  Eyes: Anicteric  Neck: Supple, trachea midline  Lungs:  Clear to auscultation, normal effort  Heart: S1S2 no rubs  Abdomen:  + distended  Extremities: no peripheral edema.  Neurologic: nonfocal  Skin: No lesions  Access: R IJ permcath    Basic Metabolic Panel: Recent Labs  Lab 09/22/17 1303 09/25/17 0743 09/25/17 1318 09/27/17 1358  NA 138 141 141 140  K 6.7* 7.2* 5.6* 6.5*  CL 104 104 104 107  CO2 24 25 27 23   GLUCOSE 131* 74 141* 126*  BUN 46* 50* 28* 62*  CREATININE 7.99* 8.97* 5.55* 9.13*  CALCIUM 9.2 9.3 8.3* 8.3*  PHOS 4.4  --   --  5.4*    Liver Function Tests: Recent Labs  Lab 09/22/17 1303 09/25/17 0743 09/27/17 1358  AST  --  26  --   ALT  --  36  --   ALKPHOS  --  225*  --   BILITOT  --  0.5  --   PROT  --  7.5  --   ALBUMIN 3.0* 3.2* 2.8*   No results for input(s): LIPASE, AMYLASE in the last 168 hours. No results for input(s): AMMONIA in the last 168 hours.  CBC: Recent Labs  Lab 09/22/17 1303 09/25/17 0743 09/27/17 1358  WBC 6.8 7.6 7.0  NEUTROABS  --  5.7  --   HGB 9.5* 9.5* 9.3*  HCT 32.0* 31.5* 31.1*  MCV 79.2* 78.0* 77.6*  PLT 241 255 287    Cardiac Enzymes: Recent Labs  Lab 09/25/17 0743 09/25/17 1318   TROPONINI 0.10* 0.08*    BNP: Invalid input(s): POCBNP  CBG: No results for input(s): GLUCAP in the last 168 hours.  Microbiology: Results for orders placed or performed during the hospital encounter of 07/12/17  Culture, blood (Routine X 2) w Reflex to ID Panel     Status: None   Collection Time: 07/12/17  4:43 PM  Result Value Ref Range Status   Specimen Description BLOOD LINE CENTRAL  Final   Special Requests   Final    BOTTLES DRAWN AEROBIC AND ANAEROBIC Blood Culture adequate volume   Culture   Final    NO GROWTH 5 DAYS Performed at [facility], [address] [place], [place] [address]    Report Status 07/17/2017 FINAL  Final    Coagulation Studies: No results for input(s): LABPROT, INR in the last 72 hours.  Urinalysis: No results for input(s): COLORURINE, LABSPEC, PHURINE, GLUCOSEU, HGBUR, BILIRUBINUR, KETONESUR, PROTEINUR, UROBILINOGEN, NITRITE, LEUKOCYTESUR in the last 72 hours.  Invalid input(s): APPERANCEUR    Imaging: No results found.   Medications:    . [START ON 09/30/2017] Chlorhexidine Gluconate Cloth  6 each Topical Q0600  . diphenhydrAMINE  25  mg Intravenous Once  . epoetin (EPOGEN/PROCRIT) injection  10,000 Units Intravenous Once     Assessment/ Plan:  57 y.o. black male withend stage renal disease on hemodialysis secondary to Alport's syndrome, ascites, hypertension, anemia of chronic kidney disease, coronary artery disease, peripheral vascular disease, hyperlipidemia, gastrointestinal AVMs, pulmonary hypertension  nooutpatientdialysis unit currently  1. Hyperkalemia:   - 2K bath on hemodialysis - Low Potassium diet  2.  End-stage renal disease:   MWF - Seen on hemodialysis today. Tolerating treatment well.    3. Ascites: status post large volume ultrasound guided paracentesis on 7/15.   4.  Anemia of CKD:    - EPO with HD treatment    5.  Secondary hyperparathyroidism: phosphorus and calcium at goal.  - low  phos diet  6. Dialysis access:  - status post Vein mapping - follow up with vascular surgery       LOS: 0 Rolonda Pontarelli 7/19/201912:35 PM

## 2017-09-29 NOTE — Progress Notes (Signed)
hkd started

## 2017-09-29 NOTE — ED Notes (Signed)
Patient transported by EDT from dialysis back to ED.  Patient refused to have vital signs re-checked and to sign discharge.  No obvious distress noted.  Walked to front door of ED with steady gait.

## 2017-09-29 NOTE — ED Provider Notes (Signed)
[facility] Emergency Department Provider Note       Time seen: ----------------------------------------- 12:00 PM on 09/29/2017 -----------------------------------------   I have reviewed the triage vital signs and the nursing notes.  HISTORY   Chief Complaint dialysis    HPI Stanley [name] is a 57 y.o. male with a history of alcohol abuse, CHF, cirrhosis, coronary disease, drug abuse who presents to the ED for his routine dialysis.  He complains of mild abdominal distention but otherwise he does not have any acute complaints, he states he feels normal.  Past Medical History:  Diagnosis Date  . Alcohol abuse   . CHF (congestive heart failure) ([place])   . Cirrhosis ([place])   . Coronary artery disease 2009  . Drug abuse ([place])   . End stage renal disease on dialysis [facility]) NEPHROLOGIST-   DR [facility]  IN [place]   HEMODIALYSIS --   TUES/  THURS/  SAT  . Gastrointestinal bleed 06/13/2017   From chart...hx of multiple GI bleeds  . GERD (gastroesophageal reflux disease)   . Hyperlipidemia   . Hypertension   . PAD (peripheral artery disease) ([place])   . Renal insufficiency    Per pt, 32 oz fluid restriction per day  . S/P triple vessel bypass 06/09/2016   2009ish  . Suicidal ideation    & HOMICIDAL IDEATION --  06-16-2013   ADMITTED TO BEHAVIOR HEALTH    Patient Active Problem List   Diagnosis Date Noted  . ESRD (end stage renal disease) on dialysis ([place]) 07/28/2017  . Protein-calorie malnutrition, severe 06/14/2017  . Encounter for dialysis [facility])   . Palliative care by specialist   . Goals of care, counseling/discussion   . Malnutrition of moderate degree 06/05/2017  . Secondary esophageal varices without bleeding ([place])   . Stomach irritation   . Idiopathic esophageal varices without bleeding ([place])   . Alcoholic hepatitis with ascites 05/24/2017  . ESRD (end stage renal disease) ([place]) 04/28/2017  . Uremia 03/08/2017  . ESRD on hemodialysis  ([place]) 03/03/2017  . Weakness 02/28/2017  . Hypocalcemia 02/22/2017  . Shortness of breath 11/26/2016  . COPD (chronic obstructive pulmonary disease) ([place]) 10/30/2016  . COPD exacerbation ([place]) 10/29/2016  . Anemia   . Heme positive stool   . Ulceration of intestine   . Benign neoplasm of transverse colon   . Acute gastrointestinal hemorrhage   . Esophageal candidiasis ([place])   . Angiodysplasia of intestinal tract   . Acute respiratory failure with hypoxia ([place]) 07/03/2016  . GI bleeding 06/24/2016  . Rectal bleeding 06/14/2016  . Anemia of chronic disease 06/01/2016  . MRSA carrier 06/01/2016  . Chronic renal failure 05/23/2016  . Ischemic heart disease 05/23/2016  . Angiodysplasia of small intestine   . Melena   . Small bowel bleed not requiring more than 4 units of blood in 24 hours, ICU, or surgery   . Anemia due to chronic blood loss   . Abdominal pain 05/05/2016  . Acute posthemorrhagic anemia 04/17/2016  . Gastrointestinal bleed 04/17/2016  . History of esophagogastroduodenoscopy (EGD) 04/17/2016  . Elevated troponin 04/17/2016  . Alcohol abuse 04/17/2016  . Upper GI bleed 01/19/2016  . Blood in stool   . Angiodysplasia of stomach and duodenum with hemorrhage   . Gastritis   . Reflux esophagitis   . GI bleed 05/16/2015  . Acute GI bleeding   . Symptomatic anemia 04/30/2015  . HTN (hypertension) 04/06/2015  . GERD (gastroesophageal reflux disease) 04/06/2015  . HLD (hyperlipidemia)  04/06/2015  . Dyspnea 04/06/2015  . Cirrhosis of liver with ascites ([place]) 04/06/2015  . Ascites 04/06/2015  . GIB (gastrointestinal bleeding) 03/23/2015  . Homicidal ideation 06/19/2013  . Suicidal intent 06/19/2013  . Homicidal ideations 06/19/2013  . Hyperkalemia 06/16/2013  . Mandible fracture ([place]) 06/05/2013  . Fracture, mandible ([place]) 06/02/2013  . Coronary atherosclerosis of native coronary artery 06/02/2013  . ESRD on dialysis ([place]) 06/02/2013  . Mandible open fracture ([place])  06/02/2013    Past Surgical History:  Procedure Laterality Date  . A/V FISTULAGRAM Right 06/06/2017   Procedure: A/V FISTULAGRAM;  Surgeon: Katha Cabal, MD;  Location: [place] CV LAB;  Service: Cardiovascular;  Laterality: Right;  . A/V SHUNT INTERVENTION N/A 06/06/2017   Procedure: A/V SHUNT INTERVENTION;  Surgeon: Katha Cabal, MD;  Location: [place] CV LAB;  Service: Cardiovascular;  Laterality: N/A;  . AGILE CAPSULE N/A 06/19/2016   Procedure: AGILE CAPSULE;  Surgeon: [name], MD;  Location: [facility] ENDOSCOPY;  Service: Endoscopy;  Laterality: N/A;  . COLONOSCOPY WITH PROPOFOL N/A 06/18/2016   Procedure: COLONOSCOPY WITH PROPOFOL;  Surgeon: [name], MD;  Location: [facility] ENDOSCOPY;  Service: Endoscopy;  Laterality: N/A;  . COLONOSCOPY WITH PROPOFOL N/A 08/12/2016   Procedure: COLONOSCOPY WITH PROPOFOL;  Surgeon: [name], MD;  Location: [facility] ENDOSCOPY;  Service: Endoscopy;  Laterality: N/A;  . COLONOSCOPY WITH PROPOFOL N/A 05/05/2017   Procedure: COLONOSCOPY WITH PROPOFOL;  Surgeon: Manya Silvas, MD;  Location: [facility] ENDOSCOPY;  Service: Endoscopy;  Laterality: N/A;  . CORONARY ANGIOPLASTY  ?   PT UNABLE TO TELL IF  BEFORE OR AFTER  CABG  . CORONARY ARTERY BYPASS GRAFT  2008  ([place])   3 VESSEL  . DIALYSIS FISTULA CREATION  LAST SURGERY  APPOX  2008  . ENTEROSCOPY N/A 05/10/2016   Procedure: ENTEROSCOPY;  Surgeon: Jerene Bears, MD;  Location: [place];  Service: Gastroenterology;  Laterality: N/A;  . ENTEROSCOPY N/A 08/12/2016   Procedure: ENTEROSCOPY;  Surgeon: [name], MD;  Location: [facility] ENDOSCOPY;  Service: Endoscopy;  Laterality: N/A;  . ENTEROSCOPY Left 06/03/2017   Procedure: ENTEROSCOPY;  Surgeon: Virgel Manifold, MD;  Location: [facility] ENDOSCOPY;  Service: Endoscopy;  Laterality: Left;  Procedure date will ultimately depend on when patient is medically optimized before the procedure, pending hemodialysis and blood transfusions etc. Will  place on schedule and change depending on clinical status.   . ENTEROSCOPY N/A 06/05/2017   Procedure: ENTEROSCOPY;  Surgeon: Virgel Manifold, MD;  Location: [facility] ENDOSCOPY;  Service: Endoscopy;  Laterality: N/A;  . ENTEROSCOPY N/A 06/15/2017   Procedure: Push ENTEROSCOPY;  Surgeon: [name], MD;  [facility] ENDOSCOPY;  Service: Endoscopy;  Laterality: N/A;  . ESOPHAGOGASTRODUODENOSCOPY N/A 05/07/2015   Procedure: ESOPHAGOGASTRODUODENOSCOPY (EGD);  Surgeon: Hulen Luster, MD;  Location: [facility] ENDOSCOPY;  Service: Endoscopy;  Laterality: N/A;  . ESOPHAGOGASTRODUODENOSCOPY (EGD) WITH PROPOFOL N/A 05/17/2015   Procedure: ESOPHAGOGASTRODUODENOSCOPY (EGD) WITH PROPOFOL;  Surgeon: [name], MD;  Location: [facility] ENDOSCOPY;  Service: Endoscopy;  Laterality: N/A;  . ESOPHAGOGASTRODUODENOSCOPY (EGD) WITH PROPOFOL N/A 01/20/2016   Procedure: ESOPHAGOGASTRODUODENOSCOPY (EGD) WITH PROPOFOL;  Surgeon: [name], MD;  Location: [facility] ENDOSCOPY;  Service: Endoscopy;  Laterality: N/A;  . ESOPHAGOGASTRODUODENOSCOPY (EGD) WITH PROPOFOL N/A 04/17/2016   Procedure: ESOPHAGOGASTRODUODENOSCOPY (EGD) WITH PROPOFOL;  Surgeon: Lin Landsman, MD;  Location: [facility] ENDOSCOPY;  Service: Gastroenterology;  Laterality: N/A;  . ESOPHAGOGASTRODUODENOSCOPY (EGD) WITH PROPOFOL  05/09/2016   Procedure: ESOPHAGOGASTRODUODENOSCOPY (EGD) WITH PROPOFOL;  Surgeon: Lajuan Lines  Pyrtle, MD;  Location: [place];  Service: Endoscopy;;  . ESOPHAGOGASTRODUODENOSCOPY (EGD) WITH PROPOFOL N/A 06/16/2016   Procedure: ESOPHAGOGASTRODUODENOSCOPY (EGD) WITH PROPOFOL;  Surgeon: [name], MD;  Location: [facility] ENDOSCOPY;  Service: Endoscopy;  Laterality: N/A;  . ESOPHAGOGASTRODUODENOSCOPY (EGD) WITH PROPOFOL N/A 05/05/2017   Procedure: ESOPHAGOGASTRODUODENOSCOPY (EGD) WITH PROPOFOL;  Surgeon: Manya Silvas, MD;  Location: [facility] ENDOSCOPY;  Service: Endoscopy;  Laterality: N/A;  . ESOPHAGOGASTRODUODENOSCOPY (EGD) WITH PROPOFOL N/A 06/15/2017    Procedure: ESOPHAGOGASTRODUODENOSCOPY (EGD) WITH PROPOFOL;  Surgeon: [name], MD;  Location: [facility] ENDOSCOPY;  Service: Endoscopy;  Laterality: N/A;  . GIVENS CAPSULE STUDY N/A 05/07/2016   Procedure: GIVENS CAPSULE STUDY;  Surgeon: Doran Stabler, MD;  Location: [place];  Service: Endoscopy;  Laterality: N/A;  . MANDIBULAR HARDWARE REMOVAL N/A 07/29/2013   Procedure: REMOVAL OF ARCH BARS;  Surgeon: Theodoro Kos, DO;  Location: [place];  Service: Plastics;  Laterality: N/A;  . ORIF MANDIBULAR FRACTURE N/A 06/05/2013   Procedure: REPAIR OF MANDIBULAR FRACTURE x 2 with maxillo-mandibular fixation ;  Surgeon: Theodoro Kos, DO;  Location: [place];  Service: Plastics;  Laterality: N/A;  . PERIPHERAL ARTERIAL STENT GRAFT Left     Allergies Patient has no known allergies.  Social History Social History   Tobacco Use  . Smoking status: Current Every Day Smoker    Packs/day: 0.15    Years: 40.00    Pack years: 6.00    Types: Cigarettes  . Smokeless tobacco: Never Used  Substance Use Topics  . Alcohol use: No    Comment: pt reports quitting after learning about cirrhosis  . Drug use: No    Frequency: 7.0 times per week    Types: Marijuana, Cocaine   Review of Systems Constitutional: Negative for fever. Cardiovascular: Negative for chest pain. Respiratory: Negative for shortness of breath. Gastrointestinal: Positive for abdominal distention Musculoskeletal: Negative for back pain. Skin: Negative for rash. Neurological: Negative for headaches, focal weakness or numbness.  All systems negative/normal/unremarkable except as stated in the HPI  ____________________________________________   PHYSICAL EXAM:  VITAL SIGNS: ED Triage Vitals [09/29/17 1147]  Enc Vitals Group     BP 131/74     Pulse Rate 80     Resp 18     Temp 98.7 F (37.1 C)     Temp Source Oral     SpO2 100 %     Weight 170 lb (77.1 kg)     Height 6\' 3"  (1.905 m)     Head  Circumference      Peak Flow      Pain Score 0     Pain Loc      Pain Edu?      Excl. in [place]?    Constitutional: Alert and oriented. Well appearing and in no distress. Eyes: Conjunctivae are normal. Normal extraocular movements. Cardiovascular: Normal rate, regular rhythm.  Murmurs present Respiratory: Normal respiratory effort without tachypnea nor retractions. Breath sounds are clear and equal bilaterally. No wheezes/rales/rhonchi. Gastrointestinal: Distended but nontender.  Normal bowel sounds. Musculoskeletal: Nontender with normal range of motion in extremities. No lower extremity tenderness nor edema. Neurologic:  Normal speech and language. No gross focal neurologic deficits are appreciated.  Skin:  Skin is warm, dry and intact. No rash noted. Psychiatric: Mood and affect are normal. Speech and behavior are normal.  ____________________________________________  ED COURSE:  As part of my medical decision making, I reviewed the following data within the [place] History obtained  from family if available, nursing notes, old chart and ekg, as well as notes from prior ED visits. Patient presented for routine dialysis, he appears medically clear for same   Procedures ____________________________________________  DIFFERENTIAL DIAGNOSIS   End-stage renal disease on dialysis  FINAL ASSESSMENT AND PLAN  End-stage renal disease, scheduled dialysis  Plan: The patient had presented for dialysis.  Patient is asymptomatic and does not require any further labs or imaging at this time.   Laurence Aly, MD   Note: This note was generated in part or whole with voice recognition software. Voice recognition is usually quite accurate but there are transcription errors that can and very often do occur. I apologize for any typographical errors that were not detected and corrected.     Earleen Newport, MD 09/29/17 [phone]

## 2017-10-02 ENCOUNTER — Encounter: Payer: Self-pay | Admitting: Emergency Medicine

## 2017-10-02 ENCOUNTER — Non-Acute Institutional Stay
Admission: EM | Admit: 2017-10-02 | Discharge: 2017-10-02 | Disposition: A | Discharge status: Home / Self Care | Payer: Medicare Other | Attending: Emergency Medicine | Admitting: Emergency Medicine

## 2017-10-02 DIAGNOSIS — E785 Hyperlipidemia, unspecified: Secondary | ICD-10-CM | POA: Diagnosis not present

## 2017-10-02 DIAGNOSIS — N186 End stage renal disease: Secondary | ICD-10-CM | POA: Diagnosis not present

## 2017-10-02 DIAGNOSIS — K7460 Unspecified cirrhosis of liver: Secondary | ICD-10-CM | POA: Insufficient documentation

## 2017-10-02 DIAGNOSIS — Z992 Dependence on renal dialysis: Secondary | ICD-10-CM | POA: Insufficient documentation

## 2017-10-02 DIAGNOSIS — Z79899 Other long term (current) drug therapy: Secondary | ICD-10-CM | POA: Diagnosis not present

## 2017-10-02 DIAGNOSIS — F17210 Nicotine dependence, cigarettes, uncomplicated: Secondary | ICD-10-CM | POA: Diagnosis not present

## 2017-10-02 DIAGNOSIS — R188 Other ascites: Secondary | ICD-10-CM | POA: Insufficient documentation

## 2017-10-02 DIAGNOSIS — I509 Heart failure, unspecified: Secondary | ICD-10-CM | POA: Insufficient documentation

## 2017-10-02 DIAGNOSIS — Z9119 Patient's noncompliance with other medical treatment and regimen: Secondary | ICD-10-CM | POA: Diagnosis not present

## 2017-10-02 DIAGNOSIS — Z951 Presence of aortocoronary bypass graft: Secondary | ICD-10-CM | POA: Insufficient documentation

## 2017-10-02 DIAGNOSIS — K219 Gastro-esophageal reflux disease without esophagitis: Secondary | ICD-10-CM | POA: Diagnosis not present

## 2017-10-02 DIAGNOSIS — I132 Hypertensive heart and chronic kidney disease with heart failure and with stage 5 chronic kidney disease, or end stage renal disease: Secondary | ICD-10-CM | POA: Insufficient documentation

## 2017-10-02 DIAGNOSIS — E875 Hyperkalemia: Secondary | ICD-10-CM | POA: Diagnosis not present

## 2017-10-02 DIAGNOSIS — I2510 Atherosclerotic heart disease of native coronary artery without angina pectoris: Secondary | ICD-10-CM | POA: Diagnosis not present

## 2017-10-02 MED ORDER — CHLORHEXIDINE GLUCONATE CLOTH 2 % EX PADS
6.0000 | MEDICATED_PAD | Freq: Every day | CUTANEOUS | Status: DC
  Filled 2017-10-02: qty 6

## 2017-10-02 MED ORDER — HEPARIN SODIUM (PORCINE) 1000 UNIT/ML DIALYSIS: 1000.0000 [IU] | INTRAMUSCULAR | Status: DC | PRN

## 2017-10-02 NOTE — ED Triage Notes (Signed)
Here for routine dialysis.

## 2017-10-02 NOTE — Progress Notes (Signed)
Pre HD Tx    10/02/17 1242  Neurological  Level of Consciousness Alert  Orientation Level Oriented X4  Respiratory  Respiratory Pattern Regular  Chest Assessment Chest expansion symmetrical  Bilateral Breath Sounds Diminished  Cough None  Cardiac  Pulse Regular  Heart Sounds S1, S2  ECG Monitor Yes  Vascular  R Radial Pulse +2  L Radial Pulse +2  Edema Generalized  Generalized Edema Other (Comment)  RLE Edema  (abdominal)  Psychosocial  Psychosocial (WDL) WDL

## 2017-10-02 NOTE — Progress Notes (Signed)
[facility]  ROUNDING NOTE   Subjective:   Seen and examined prior to starting hemodialysis.  Reports abdomina distention. Went to party over the weekend   Objective:  Vital signs in last 24 hours:  Temp:  [97.8 F (36.6 C)] 97.8 F (36.6 C) (07/22 1141) Pulse Rate:  [84] 84 (07/22 1141) Resp:  [20] 20 (07/22 1141) BP: (125)/(83) 125/83 (07/22 1141) SpO2:  [100 %] 100 % (07/22 1141)  Weight change:  There were no vitals filed for this visit.  Intake/Output: No intake/output data recorded.   Intake/Output this shift:  No intake/output data recorded.  Physical Exam: General: No acute distress  Head: Normocephalic, atraumatic. Moist oral mucosal membranes  Eyes: Anicteric  Neck: Supple, trachea midline  Lungs:  Clear to auscultation, normal effort  Heart: S1S2 no rubs, prominent systolic murmur  Abdomen:  + distended, ascites  Extremities: + peripheral edema.  Neurologic: nonfocal  Skin: No lesions  Access: R IJ permcath    Basic Metabolic Panel: Recent Labs  Lab 09/25/17 1318 09/27/17 1358 09/29/17 1325  NA 141 140 139  K 5.6* 6.5* 5.8*  CL 104 107 104  CO2 27 23 25   GLUCOSE 141* 126* 117*  BUN 28* 62* 50*  CREATININE 5.55* 9.13* 8.30*  CALCIUM 8.3* 8.3* 8.6*  PHOS  --  5.4* 6.0*    Liver Function Tests: Recent Labs  Lab 09/27/17 1358 09/29/17 1325  ALBUMIN 2.8* 2.8*   No results for input(s): LIPASE, AMYLASE in the last 168 hours. No results for input(s): AMMONIA in the last 168 hours.  CBC: Recent Labs  Lab 09/27/17 1358 09/29/17 1325  WBC 7.0 7.4  HGB 9.3* 9.6*  HCT 31.1* 31.0*  MCV 77.6* 76.3*  PLT 287 300    Cardiac Enzymes: Recent Labs  Lab 09/25/17 1318  TROPONINI 0.08*    BNP: Invalid input(s): POCBNP  CBG: No results for input(s): GLUCAP in the last 168 hours.  Microbiology: Results for orders placed or performed during the hospital encounter of 07/12/17  Culture, blood (Routine X 2) w Reflex to ID  Panel     Status: None   Collection Time: 07/12/17  4:43 PM  Result Value Ref Range Status   Specimen Description BLOOD LINE CENTRAL  Final   Special Requests   Final    BOTTLES DRAWN AEROBIC AND ANAEROBIC Blood Culture adequate volume   Culture   Final    NO GROWTH 5 DAYS Performed at [facility], [address] [place], [place] [address]    Report Status 07/17/2017 FINAL  Final    Coagulation Studies: No results for input(s): LABPROT, INR in the last 72 hours.  Urinalysis: No results for input(s): COLORURINE, LABSPEC, PHURINE, GLUCOSEU, HGBUR, BILIRUBINUR, KETONESUR, PROTEINUR, UROBILINOGEN, NITRITE, LEUKOCYTESUR in the last 72 hours.  Invalid input(s): APPERANCEUR    Imaging: No results found.   Medications:    . [START ON 10/03/2017] Chlorhexidine Gluconate Cloth  6 each Topical Q0600     Assessment/ Plan:  57 y.o. black male withend stage renal disease on hemodialysis secondary to Alport's syndrome, ascites, hypertension, anemia of chronic kidney disease, coronary artery disease, peripheral vascular disease, hyperlipidemia, gastrointestinal AVMs, pulmonary hypertension  nooutpatientdialysis unit currently  1. Hyperkalemia:   - 2K bath on hemodialysis - Low Potassium diet  2.  End-stage renal disease:   MWF - Seen on hemodialysis today. Tolerating treatment well.    3. Ascites: status post large volume ultrasound guided paracentesis on 7/15.  - UF  with HD as tolerated - 13 mL/kg/hr . Goal ~ 4.2 L in 4 hrs - May increase goal if patient will stay longer  4. Care coordination Patient has been accepted to [facility] HD tentatively for 90 day under behavior contract May be able to start saturday     LOS: 0 Normand Damron 7/22/201912:34 PM

## 2017-10-02 NOTE — Progress Notes (Signed)
Post HD [facility]    10/02/17 1700  Hand-Off documentation  Report given to (Full Name) Caryl Pina, [place]  Report received from (Full Name) Beatris Ship, RN   Vital Signs  Temp 97.8 F (36.6 C)  Temp Source Oral  Pulse Rate 84  Pulse Rate Source Monitor  Resp 18  BP 126/63  BP Location Left Arm  BP Method Automatic  Patient Position (if appropriate) Sitting  Oxygen Therapy  SpO2 98 %  O2 Device Room Air  Pain Assessment  Pain Scale 0-10  Pain Score 0  Post-Hemodialysis Assessment  Rinseback Volume (mL) 250 mL  KECN 76.4 V  Dialyzer Clearance Lightly streaked  Duration of HD Treatment -hour(s) 4 hour(s)  Hemodialysis Intake (mL) 500 mL  UF Total -Machine (mL) 4376 mL  Net UF (mL) 3876 mL  Tolerated HD Treatment Yes  Hemodialysis Catheter Right Internal jugular Double-lumen  No Placement Date or Time found.   Placed prior to admission: Yes  Orientation: Right  Access Location: Internal jugular  Hemodialysis Catheter Type: Double-lumen  Site Condition No complications  Blue Lumen Status Heparin locked  Red Lumen Status Heparin locked  Post treatment catheter status Capped and Clamped

## 2017-10-02 NOTE — ED Provider Notes (Signed)
[facility] Emergency Department Provider Note  ____________________________________________  Time seen: Approximately 5:50 PM  I have reviewed the triage vital signs and the nursing notes.   HISTORY  Chief Complaint Vascular Access Problem  Level 5 Caveat: Portions of the History and Physical including HPI and review of systems are unable to be completely obtained due to patient being a poor historian due to wanting to be discharged immediately   HPI Stanley [name] is a 57 y.o. male brought back to the ED after having HD in the hospital. Feels fine. No complaints.       Past Medical History:  Diagnosis Date  . Alcohol abuse   . CHF (congestive heart failure) ([place])   . Cirrhosis ([place])   . Coronary artery disease 2009  . Drug abuse ([place])   . End stage renal disease on dialysis [facility]) NEPHROLOGIST-   DR [facility]  [place]   HEMODIALYSIS --   TUES/  THURS/  SAT  . Gastrointestinal bleed 06/13/2017   From chart...hx of multiple GI bleeds  . GERD (gastroesophageal reflux disease)   . Hyperlipidemia   . Hypertension   . PAD (peripheral artery disease) ([place])   . Renal insufficiency    Per pt, 32 oz fluid restriction per day  . S/P triple vessel bypass 06/09/2016   2009ish  . Suicidal ideation    & HOMICIDAL IDEATION --  06-16-2013   ADMITTED TO BEHAVIOR HEALTH     Patient Active Problem List   Diagnosis Date Noted  . ESRD (end stage renal disease) on dialysis ([place]) 07/28/2017  . Protein-calorie malnutrition, severe 06/14/2017  . Encounter for dialysis [facility])   . Palliative care by specialist   . Goals of care, counseling/discussion   . Malnutrition of moderate degree 06/05/2017  . Secondary esophageal varices without bleeding ([place])   . Stomach irritation   . Idiopathic esophageal varices without bleeding ([place])   . Alcoholic hepatitis with ascites 05/24/2017  . ESRD (end stage renal disease) ([place]) 04/28/2017  . Uremia 03/08/2017  . ESRD  on hemodialysis ([place]) 03/03/2017  . Weakness 02/28/2017  . Hypocalcemia 02/22/2017  . Shortness of breath 11/26/2016  . COPD (chronic obstructive pulmonary disease) ([place]) 10/30/2016  . COPD exacerbation ([place]) 10/29/2016  . Anemia   . Heme positive stool   . Ulceration of intestine   . Benign neoplasm of transverse colon   . Acute gastrointestinal hemorrhage   . Esophageal candidiasis ([place])   . Angiodysplasia of intestinal tract   . Acute respiratory failure with hypoxia ([place]) 07/03/2016  . GI bleeding 06/24/2016  . Rectal bleeding 06/14/2016  . Anemia of chronic disease 06/01/2016  . MRSA carrier 06/01/2016  . Chronic renal failure 05/23/2016  . Ischemic heart disease 05/23/2016  . Angiodysplasia of small intestine   . Melena   . Small bowel bleed not requiring more than 4 units of blood in 24 hours, ICU, or surgery   . Anemia due to chronic blood loss   . Abdominal pain 05/05/2016  . Acute posthemorrhagic anemia 04/17/2016  . Gastrointestinal bleed 04/17/2016  . History of esophagogastroduodenoscopy (EGD) 04/17/2016  . Elevated troponin 04/17/2016  . Alcohol abuse 04/17/2016  . Upper GI bleed 01/19/2016  . Blood in stool   . Angiodysplasia of stomach and duodenum with hemorrhage   . Gastritis   . Reflux esophagitis   . GI bleed 05/16/2015  . Acute GI bleeding   . Symptomatic anemia 04/30/2015  . HTN (hypertension) 04/06/2015  .  GERD (gastroesophageal reflux disease) 04/06/2015  . HLD (hyperlipidemia) 04/06/2015  . Dyspnea 04/06/2015  . Cirrhosis of liver with ascites ([place]) 04/06/2015  . Ascites 04/06/2015  . GIB (gastrointestinal bleeding) 03/23/2015  . Homicidal ideation 06/19/2013  . Suicidal intent 06/19/2013  . Homicidal ideations 06/19/2013  . Hyperkalemia 06/16/2013  . Mandible fracture ([place]) 06/05/2013  . Fracture, mandible ([place]) 06/02/2013  . Coronary atherosclerosis of native coronary artery 06/02/2013  . ESRD on dialysis ([place]) 06/02/2013  . Mandible  open fracture ([place]) 06/02/2013     Past Surgical History:  Procedure Laterality Date  . A/V FISTULAGRAM Right 06/06/2017   Procedure: A/V FISTULAGRAM;  Surgeon: Katha Cabal, MD;  Location: [place] CV LAB;  Service: Cardiovascular;  Laterality: Right;  . A/V SHUNT INTERVENTION N/A 06/06/2017   Procedure: A/V SHUNT INTERVENTION;  Surgeon: Katha Cabal, MD;  Location: [place] CV LAB;  Service: Cardiovascular;  Laterality: N/A;  . AGILE CAPSULE N/A 06/19/2016   Procedure: AGILE CAPSULE;  Surgeon: [name], MD;  Location: [facility] ENDOSCOPY;  Service: Endoscopy;  Laterality: N/A;  . COLONOSCOPY WITH PROPOFOL N/A 06/18/2016   Procedure: COLONOSCOPY WITH PROPOFOL;  Surgeon: [name], MD;  Location: [facility] ENDOSCOPY;  Service: Endoscopy;  Laterality: N/A;  . COLONOSCOPY WITH PROPOFOL N/A 08/12/2016   Procedure: COLONOSCOPY WITH PROPOFOL;  Surgeon: [name], MD;  Location: [facility] ENDOSCOPY;  Service: Endoscopy;  Laterality: N/A;  . COLONOSCOPY WITH PROPOFOL N/A 05/05/2017   Procedure: COLONOSCOPY WITH PROPOFOL;  Surgeon: Manya Silvas, MD;  Location: [facility] ENDOSCOPY;  Service: Endoscopy;  Laterality: N/A;  . CORONARY ANGIOPLASTY  ?   PT UNABLE TO TELL IF  BEFORE OR AFTER  CABG  . CORONARY ARTERY BYPASS GRAFT  2008  ([place])   3 VESSEL  . DIALYSIS FISTULA CREATION  LAST SURGERY  APPOX  2008  . ENTEROSCOPY N/A 05/10/2016   Procedure: ENTEROSCOPY;  Surgeon: Jerene Bears, MD;  Location: [place];  Service: Gastroenterology;  Laterality: N/A;  . ENTEROSCOPY N/A 08/12/2016   Procedure: ENTEROSCOPY;  Surgeon: [name], MD;  Location: [facility] ENDOSCOPY;  Service: Endoscopy;  Laterality: N/A;  . ENTEROSCOPY Left 06/03/2017   Procedure: ENTEROSCOPY;  Surgeon: Virgel Manifold, MD;  Location: [facility] ENDOSCOPY;  Service: Endoscopy;  Laterality: Left;  Procedure date will ultimately depend on when patient is medically optimized before the procedure, pending hemodialysis and blood  transfusions etc. Will place on schedule and change depending on clinical status.   . ENTEROSCOPY N/A 06/05/2017   Procedure: ENTEROSCOPY;  Surgeon: Virgel Manifold, MD;  Location: [facility] ENDOSCOPY;  Service: Endoscopy;  Laterality: N/A;  . ENTEROSCOPY N/A 06/15/2017   Procedure: Push ENTEROSCOPY;  Surgeon: [name], MD;  Location: [facility] ENDOSCOPY;  Service: Endoscopy;  Laterality: N/A;  . ESOPHAGOGASTRODUODENOSCOPY N/A 05/07/2015   Procedure: ESOPHAGOGASTRODUODENOSCOPY (EGD);  Surgeon: Hulen Luster, MD;  Location: [facility] ENDOSCOPY;  Service: Endoscopy;  Laterality: N/A;  . ESOPHAGOGASTRODUODENOSCOPY (EGD) WITH PROPOFOL N/A 05/17/2015   Procedure: ESOPHAGOGASTRODUODENOSCOPY (EGD) WITH PROPOFOL;  Surgeon: [name], MD;  Location: [facility] ENDOSCOPY;  Service: Endoscopy;  Laterality: N/A;  . ESOPHAGOGASTRODUODENOSCOPY (EGD) WITH PROPOFOL N/A 01/20/2016   Procedure: ESOPHAGOGASTRODUODENOSCOPY (EGD) WITH PROPOFOL;  Surgeon: [name], MD;  Location: [facility] ENDOSCOPY;  Service: Endoscopy;  Laterality: N/A;  . ESOPHAGOGASTRODUODENOSCOPY (EGD) WITH PROPOFOL N/A 04/17/2016   Procedure: ESOPHAGOGASTRODUODENOSCOPY (EGD) WITH PROPOFOL;  Surgeon: Lin Landsman, MD;  Location: [facility] ENDOSCOPY;  Service: Gastroenterology;  Laterality: N/A;  . ESOPHAGOGASTRODUODENOSCOPY (EGD) WITH PROPOFOL  05/09/2016  Procedure: ESOPHAGOGASTRODUODENOSCOPY (EGD) WITH PROPOFOL;  Surgeon: Jerene Bears, MD;  Location: [place];  Service: Endoscopy;;  . ESOPHAGOGASTRODUODENOSCOPY (EGD) WITH PROPOFOL N/A 06/16/2016   Procedure: ESOPHAGOGASTRODUODENOSCOPY (EGD) WITH PROPOFOL;  Surgeon: [name], MD;  Location: [facility] ENDOSCOPY;  Service: Endoscopy;  Laterality: N/A;  . ESOPHAGOGASTRODUODENOSCOPY (EGD) WITH PROPOFOL N/A 05/05/2017   Procedure: ESOPHAGOGASTRODUODENOSCOPY (EGD) WITH PROPOFOL;  Surgeon: Manya Silvas, MD;  Location: [facility] ENDOSCOPY;  Service: Endoscopy;  Laterality: N/A;  . ESOPHAGOGASTRODUODENOSCOPY (EGD) WITH PROPOFOL  N/A 06/15/2017   Procedure: ESOPHAGOGASTRODUODENOSCOPY (EGD) WITH PROPOFOL;  Surgeon: [name], MD;  Location: [facility] ENDOSCOPY;  Service: Endoscopy;  Laterality: N/A;  . GIVENS CAPSULE STUDY N/A 05/07/2016   Procedure: GIVENS CAPSULE STUDY;  Surgeon: Doran Stabler, MD;  Location: [place];  Service: Endoscopy;  Laterality: N/A;  . MANDIBULAR HARDWARE REMOVAL N/A 07/29/2013   Procedure: REMOVAL OF ARCH BARS;  Surgeon: Theodoro Kos, DO;  Location: [place];  Service: Plastics;  Laterality: N/A;  . ORIF MANDIBULAR FRACTURE N/A 06/05/2013   Procedure: REPAIR OF MANDIBULAR FRACTURE x 2 with maxillo-mandibular fixation ;  Surgeon: Theodoro Kos, DO;  Location: [place];  Service: Plastics;  Laterality: N/A;  . PERIPHERAL ARTERIAL STENT GRAFT Left      Prior to Admission medications   Medication Sig Start Date End Date Taking? Authorizing Provider  albuterol (PROVENTIL HFA;VENTOLIN HFA) 108 (90 Base) MCG/ACT inhaler Inhale 2 puffs into the lungs [name] 6 (six) hours as needed for wheezing or shortness of breath. 04/21/17   Lisa Roca, MD  alum & mag hydroxide-simeth (MAALOX/MYLANTA) 200-200-20 MG/5ML suspension Take 15 mLs [name] 4 (four) hours as needed by mouth for indigestion or heartburn. Patient not taking: Reported on 04/19/2017 01/25/17   Demetrios Loll, MD  atorvastatin (LIPITOR) 40 MG tablet Take 1 tablet (40 mg total) by mouth daily. 11/16/16   Vaughan Basta, MD  budesonide-formoterol (SYMBICORT) 160-4.5 MCG/ACT inhaler Inhale 2 puffs into the lungs daily. 11/16/16   Vaughan Basta, MD  calcium acetate (PHOSLO) 667 MG capsule Take 2,001 mg by mouth 3 (three) times daily.    [provider]  doxycycline (VIBRA-TABS) 100 MG tablet Take 1 tablet (100 mg total) by mouth 2 (two) times daily. Patient not taking: Reported on 08/28/2017 07/10/17   Harvest Dark, MD  ferrous sulfate 325 (65 FE) MG tablet Take 1 tablet by mouth 3 (three) times daily. 01/21/17    [provider]  furosemide (LASIX) 80 MG tablet Take 1 tablet (80 mg total) daily by mouth. 01/25/17   Demetrios Loll, MD  gabapentin (NEURONTIN) 300 MG capsule Take 1 capsule by mouth daily as directed 08/16/16   [provider]  ipratropium-albuterol (DUONEB) 0.5-2.5 (3) MG/3ML SOLN Take 3 mLs by nebulization [name] 6 (six) hours as needed. 11/28/16   Loletha Grayer, MD  labetalol (NORMODYNE) 100 MG tablet Take 100 mg by mouth daily. 03/24/17   [provider]  Multiple Vitamins-Minerals-FA (DIALYVITE SUPREME D) 3 MG TABS Take 1 tablet (3 mg total) by mouth daily. 11/28/16   Loletha Grayer, MD  nitroGLYCERIN (NITROSTAT) 0.4 MG SL tablet Place 1 tablet (0.4 mg total) under the tongue [name] 5 (five) minutes as needed. 04/12/16   Wende Bushy, MD  pantoprazole (PROTONIX) 40 MG tablet Take 1 tablet (40 mg total) by mouth daily. 11/28/16   Loletha Grayer, MD  Spacer/Aero Chamber Mouthpiece MISC 1 Units by Does not apply route [name] 4 (four) hours as needed (wheezing). 02/19/17   Darel Hong, MD  tiotropium (SPIRIVA HANDIHALER) 18 MCG inhalation capsule Place 1 capsule (18 mcg total) into inhaler and inhale daily. 11/16/16 11/16/17  Vaughan Basta, MD     Allergies Patient has no known allergies.   Family History  Problem Relation Age of Onset  . Colon cancer Mother   . Cancer Father   . Cancer Sister   . Kidney disease Brother     Social History Social History   Tobacco Use  . Smoking status: Current [name] Day Smoker    Packs/day: 0.15    Years: 40.00    Pack years: 6.00    Types: Cigarettes  . Smokeless tobacco: Never Used  Substance Use Topics  . Alcohol use: No    Comment: pt reports quitting after learning about cirrhosis  . Drug use: No    Frequency: 7.0 times per week    Types: Marijuana, Cocaine    Review of Systems  Constitutional:   No fever or chills.   Cardiovascular:   No chest pain or syncope. Respiratory:   No dyspnea or  cough. All other systems reviewed and are negative except as documented above in ROS and HPI.  ____________________________________________   PHYSICAL EXAM:  VITAL SIGNS: ED Triage Vitals  Enc Vitals Group     BP 10/02/17 1141 125/83     Pulse Rate 10/02/17 1141 84     Resp 10/02/17 1141 20     Temp 10/02/17 1141 97.8 F (36.6 C)     Temp Source 10/02/17 1141 Oral     SpO2 10/02/17 1141 100 %     Weight 10/02/17 1245 179 lb 14.3 oz (81.6 kg)     Height --      Head Circumference --      Peak Flow --      Pain Score 10/02/17 1245 0     Pain Loc --      Pain Edu? --      Excl. in [place]? --     Vital signs reviewed, nursing assessments reviewed.  Pt not cooperative with exam, starts walking out of the ED  Constitutional:   Alert and oriented. Non-toxic appearance.  ENT      Head:   Normocephalic and atraumatic.  Neurologic:   Normal speech and language.  Motor grossly intact. AMBULATORY WIT STEADY GAIT No acute focal neurologic deficits are appreciated.   ____________________________________________    LABS (pertinent positives/negatives) (all labs ordered are listed, but only abnormal results are displayed) Labs Reviewed - No data to display ____________________________________________   EKG    ____________________________________________    RADIOLOGY  No results found.  ____________________________________________   PROCEDURES Procedures  ____________________________________________    CLINICAL IMPRESSION / ASSESSMENT AND PLAN / ED COURSE  Pertinent labs & imaging results that were available during my care of the patient were reviewed by me and considered in my medical decision making (see chart for details).    Pt well appearing, at baseline. Feeling fine after routine HD. Eloping from ED prior to completing repeat assessment, but without any acute complaints and receiving his routine dialysis, no further workup or intervention needed.  Pt  left ED before receiving his discharge instructions. He has been well informed of dialysis precautions in the past.       ____________________________________________   FINAL CLINICAL IMPRESSION(S) / ED DIAGNOSES    Final diagnoses:  [name] (HCC)  Cirrhosis of liver with ascites, unspecified hepatic cirrhosis type [facility])     ED Discharge Orders  None      Portions of this note were generated with dragon dictation software. Dictation errors may occur despite best attempts at proofreading.    Carrie Mew, MD 10/02/17 1754

## 2017-10-02 NOTE — Progress Notes (Signed)
HD Tx started w/o complication   25/36/64 4034  Hand-Off documentation  Report given to (Full Name) Beatris Ship, RN   Report received from (Full Name) Stanton Kidney, RN   Vital Signs  Temp 97.8 F (36.6 C)  Temp Source Oral  Pulse Rate 78  Pulse Rate Source Monitor  Resp 20  BP (!) 136/114  BP Location Left Arm  BP Method Automatic  Patient Position (if appropriate) Sitting  Oxygen Therapy  SpO2 100 %  O2 Device Room Air  Pain Assessment  Pain Scale 0-10  Pain Score 0  Dialysis Weight  Weight 81.6 kg (179 lb 14.3 oz)  Type of Weight Pre-Dialysis  Time-Out for Hemodialysis  What Procedure? HD   Pt Identifiers(min of two) First/Last Name;MRN/Account#  Correct Site? Yes  Correct Side? Yes  Correct Procedure? Yes  Consents Verified? Yes  Rad Studies Available? N/A  Safety Precautions Reviewed? Yes  Engineer, civil (consulting) Number [phone]  Station Number 3  UF/Alarm Test Passed  Conductivity: Meter 14  Conductivity: Machine  14  pH 7.4  Reverse Osmosis Main  Normal Saline Lot Number [id number]  Dialyzer Lot Number [id number]  Disposable Set Lot Number [id number]  Machine Temperature 98.6 F (37 C)  Musician and Audible Yes  Blood Lines Intact and Secured Yes  Pre Treatment Patient Checks  Vascular access used during treatment Catheter  Patient is receiving dialysis in a chair Yes  Hepatitis B Surface Antigen Results Negative  Date Hepatitis B Surface Antigen Drawn 06/16/17  Hepatitis B Surface Antibody  (>10)  Date Hepatitis B Surface Antibody Drawn 06/16/17  Hemodialysis Consent Verified Yes  Hemodialysis Standing Orders Initiated Yes  ECG (Telemetry) Monitor On Yes  Prime Ordered Normal Saline  Length of  DialysisTreatment -hour(s) 4 Hour(s)  Dialysis Treatment Comments Na 140  Dialyzer Elisio 17H NR  Dialysate 2K, 2.5 Ca  Dialysis Anticoagulant None  Dialysate Flow Ordered 800  Blood Flow Rate Ordered 400 mL/min  Ultrafiltration Goal 4.2 Liters  Dialysis Blood  Pressure Support Ordered Normal Saline  During Hemodialysis Assessment  Blood Flow Rate (mL/min) 400 mL/min  Arterial Pressure (mmHg) -120 mmHg  Venous Pressure (mmHg) 100 mmHg  Transmembrane Pressure (mmHg) 70 mmHg  Ultrafiltration Rate (mL/min) 1170 mL/min  Dialysate Flow Rate (mL/min) 800 ml/min  Conductivity: Machine  14  HD Safety Checks Performed Yes  Intra-Hemodialysis Comments Progressing as prescribed

## 2017-10-02 NOTE — Progress Notes (Signed)
HD Tx completed, tolerated well, pt had slight cramping at end of tx MD aware.    10/02/17 1645  Vital Signs  Pulse Rate 81  Pulse Rate Source Monitor  Resp 18  BP 117/65  BP Location Left Arm  BP Method Automatic  Patient Position (if appropriate) Sitting  Oxygen Therapy  SpO2 99 %  O2 Device Room Air  During Hemodialysis Assessment  HD Safety Checks Performed Yes  KECN 74.6 KECN  Dialysis Fluid Bolus Normal Saline  Bolus Amount (mL) 250 mL  Intra-Hemodialysis Comments Tolerated well;Tx completed

## 2017-10-02 NOTE — ED Provider Notes (Signed)
[facility] Emergency Department Provider Note  ____________________________________________  Time seen: Approximately 12:15 PM  I have reviewed the triage vital signs and the nursing notes.   HISTORY  Chief Complaint Vascular Access Problem    HPI Stanley [name] is a 57 y.o. male with a history of congestive heart failure, cirrhosis, end-stage renal disease on hemodialysis, and chronic medical noncompliance who comes today for routine dialysis.  Denies any acute complaints.  No chest pain shortness of breath or palpitations.  Last dialysis was 3 days ago on Friday per his usual schedule.  Last paracentesis was 1 week ago.  He reports that since then he went to a party and drank a lot and has had some reaccumulation of ascites but denies shortness of breath or abdominal pain.      Past Medical History:  Diagnosis Date  . Alcohol abuse   . CHF (congestive heart failure) ([place])   . Cirrhosis ([place])   . Coronary artery disease 2009  . Drug abuse ([place])   . End stage renal disease on dialysis [facility]) NEPHROLOGIST-   DR [facility]  IN [place]   HEMODIALYSIS --   TUES/  THURS/  SAT  . Gastrointestinal bleed 06/13/2017   From chart...hx of multiple GI bleeds  . GERD (gastroesophageal reflux disease)   . Hyperlipidemia   . Hypertension   . PAD (peripheral artery disease) ([place])   . Renal insufficiency    Per pt, 32 oz fluid restriction per day  . S/P triple vessel bypass 06/09/2016   2009ish  . Suicidal ideation    & HOMICIDAL IDEATION --  06-16-2013   ADMITTED TO BEHAVIOR HEALTH     Patient Active Problem List   Diagnosis Date Noted  . ESRD (end stage renal disease) on dialysis ([place]) 07/28/2017  . Protein-calorie malnutrition, severe 06/14/2017  . Encounter for dialysis [facility])   . Palliative care by specialist   . Goals of care, counseling/discussion   . Malnutrition of moderate degree 06/05/2017  . Secondary esophageal varices without bleeding ([place])    . Stomach irritation   . Idiopathic esophageal varices without bleeding ([place])   . Alcoholic hepatitis with ascites 05/24/2017  . ESRD (end stage renal disease) ([place]) 04/28/2017  . Uremia 03/08/2017  . ESRD on hemodialysis ([place]) 03/03/2017  . Weakness 02/28/2017  . Hypocalcemia 02/22/2017  . Shortness of breath 11/26/2016  . COPD (chronic obstructive pulmonary disease) ([place]) 10/30/2016  . COPD exacerbation ([place]) 10/29/2016  . Anemia   . Heme positive stool   . Ulceration of intestine   . Benign neoplasm of transverse colon   . Acute gastrointestinal hemorrhage   . Esophageal candidiasis ([place])   . Angiodysplasia of intestinal tract   . Acute respiratory failure with hypoxia ([place]) 07/03/2016  . GI bleeding 06/24/2016  . Rectal bleeding 06/14/2016  . Anemia of chronic disease 06/01/2016  . MRSA carrier 06/01/2016  . Chronic renal failure 05/23/2016  . Ischemic heart disease 05/23/2016  . Angiodysplasia of small intestine   . Melena   . Small bowel bleed not requiring more than 4 units of blood in 24 hours, ICU, or surgery   . Anemia due to chronic blood loss   . Abdominal pain 05/05/2016  . Acute posthemorrhagic anemia 04/17/2016  . Gastrointestinal bleed 04/17/2016  . History of esophagogastroduodenoscopy (EGD) 04/17/2016  . Elevated troponin 04/17/2016  . Alcohol abuse 04/17/2016  . Upper GI bleed 01/19/2016  . Blood in stool   . Angiodysplasia of stomach and  duodenum with hemorrhage   . Gastritis   . Reflux esophagitis   . GI bleed 05/16/2015  . Acute GI bleeding   . Symptomatic anemia 04/30/2015  . HTN (hypertension) 04/06/2015  . GERD (gastroesophageal reflux disease) 04/06/2015  . HLD (hyperlipidemia) 04/06/2015  . Dyspnea 04/06/2015  . Cirrhosis of liver with ascites ([place]) 04/06/2015  . Ascites 04/06/2015  . GIB (gastrointestinal bleeding) 03/23/2015  . Homicidal ideation 06/19/2013  . Suicidal intent 06/19/2013  . Homicidal ideations 06/19/2013  .  Hyperkalemia 06/16/2013  . Mandible fracture ([place]) 06/05/2013  . Fracture, mandible ([place]) 06/02/2013  . Coronary atherosclerosis of native coronary artery 06/02/2013  . ESRD on dialysis ([place]) 06/02/2013  . Mandible open fracture ([place]) 06/02/2013     Past Surgical History:  Procedure Laterality Date  . A/V FISTULAGRAM Right 06/06/2017   Procedure: A/V FISTULAGRAM;  Surgeon: Katha Cabal, MD;  Location: [place] CV LAB;  Service: Cardiovascular;  Laterality: Right;  . A/V SHUNT INTERVENTION N/A 06/06/2017   Procedure: A/V SHUNT INTERVENTION;  Surgeon: Katha Cabal, MD;  Location: [place] CV LAB;  Service: Cardiovascular;  Laterality: N/A;  . AGILE CAPSULE N/A 06/19/2016   Procedure: AGILE CAPSULE;  Surgeon: [name], MD;  Location: [facility] ENDOSCOPY;  Service: Endoscopy;  Laterality: N/A;  . COLONOSCOPY WITH PROPOFOL N/A 06/18/2016   Procedure: COLONOSCOPY WITH PROPOFOL;  Surgeon: [name], MD;  Location: [facility] ENDOSCOPY;  Service: Endoscopy;  Laterality: N/A;  . COLONOSCOPY WITH PROPOFOL N/A 08/12/2016   Procedure: COLONOSCOPY WITH PROPOFOL;  Surgeon: [name], MD;  Location: [facility] ENDOSCOPY;  Service: Endoscopy;  Laterality: N/A;  . COLONOSCOPY WITH PROPOFOL N/A 05/05/2017   Procedure: COLONOSCOPY WITH PROPOFOL;  Surgeon: Manya Silvas, MD;  Location: [facility] ENDOSCOPY;  Service: Endoscopy;  Laterality: N/A;  . CORONARY ANGIOPLASTY  ?   PT UNABLE TO TELL IF  BEFORE OR AFTER  CABG  . CORONARY ARTERY BYPASS GRAFT  2008  ([place])   3 VESSEL  . DIALYSIS FISTULA CREATION  LAST SURGERY  APPOX  2008  . ENTEROSCOPY N/A 05/10/2016   Procedure: ENTEROSCOPY;  Surgeon: Jerene Bears, MD;  Location: [place];  Service: Gastroenterology;  Laterality: N/A;  . ENTEROSCOPY N/A 08/12/2016   Procedure: ENTEROSCOPY;  Surgeon: [name], MD;  Location: [facility] ENDOSCOPY;  Service: Endoscopy;  Laterality: N/A;  . ENTEROSCOPY Left 06/03/2017   Procedure: ENTEROSCOPY;  Surgeon:  Virgel Manifold, MD;  Location: [facility] ENDOSCOPY;  Service: Endoscopy;  Laterality: Left;  Procedure date will ultimately depend on when patient is medically optimized before the procedure, pending hemodialysis and blood transfusions etc. Will place on schedule and change depending on clinical status.   . ENTEROSCOPY N/A 06/05/2017   Procedure: ENTEROSCOPY;  Surgeon: Virgel Manifold, MD;  Location: [facility] ENDOSCOPY;  Service: Endoscopy;  Laterality: N/A;  . ENTEROSCOPY N/A 06/15/2017   Procedure: Push ENTEROSCOPY;  Surgeon: [name], MD;  Location: [facility] ENDOSCOPY;  Service: Endoscopy;  Laterality: N/A;  . ESOPHAGOGASTRODUODENOSCOPY N/A 05/07/2015   Procedure: ESOPHAGOGASTRODUODENOSCOPY (EGD);  Surgeon: Hulen Luster, MD;  Location: [facility] ENDOSCOPY;  Service: Endoscopy;  Laterality: N/A;  . ESOPHAGOGASTRODUODENOSCOPY (EGD) WITH PROPOFOL N/A 05/17/2015   Procedure: ESOPHAGOGASTRODUODENOSCOPY (EGD) WITH PROPOFOL;  Surgeon: [name], MD;  Location: [facility] ENDOSCOPY;  Service: Endoscopy;  Laterality: N/A;  . ESOPHAGOGASTRODUODENOSCOPY (EGD) WITH PROPOFOL N/A 01/20/2016   Procedure: ESOPHAGOGASTRODUODENOSCOPY (EGD) WITH PROPOFOL;  Surgeon: [name], MD;  Location: [facility] ENDOSCOPY;  Service: Endoscopy;  Laterality: N/A;  . ESOPHAGOGASTRODUODENOSCOPY (EGD)  WITH PROPOFOL N/A 04/17/2016   Procedure: ESOPHAGOGASTRODUODENOSCOPY (EGD) WITH PROPOFOL;  Surgeon: Lin Landsman, MD;  Location[facility] ENDOSCOPY;  Service: Gastroenterology;  Laterality: N/A;  . ESOPHAGOGASTRODUODENOSCOPY (EGD) WITH PROPOFOL  05/09/2016   Procedure: ESOPHAGOGASTRODUODENOSCOPY (EGD) WITH PROPOFOL;  Surgeon: Jerene Bears, MD;  Location: [place];  Service: Endoscopy;;  . ESOPHAGOGASTRODUODENOSCOPY (EGD) WITH PROPOFOL N/A 06/16/2016   Procedure: ESOPHAGOGASTRODUODENOSCOPY (EGD) WITH PROPOFOL;  Surgeon: [name], MD;  Location: [facility] ENDOSCOPY;  Service: Endoscopy;  Laterality: N/A;  . ESOPHAGOGASTRODUODENOSCOPY (EGD) WITH PROPOFOL N/A  05/05/2017   Procedure: ESOPHAGOGASTRODUODENOSCOPY (EGD) WITH PROPOFOL;  Surgeon: Manya Silvas, MD;  Location: [facility] ENDOSCOPY;  Service: Endoscopy;  Laterality: N/A;  . ESOPHAGOGASTRODUODENOSCOPY (EGD) WITH PROPOFOL N/A 06/15/2017   Procedure: ESOPHAGOGASTRODUODENOSCOPY (EGD) WITH PROPOFOL;  Surgeon: [name], MD;  Location: [facility] ENDOSCOPY;  Service: Endoscopy;  Laterality: N/A;  . GIVENS CAPSULE STUDY N/A 05/07/2016   Procedure: GIVENS CAPSULE STUDY;  Surgeon: Doran Stabler, MD;  Location: [place];  Service: Endoscopy;  Laterality: N/A;  . MANDIBULAR HARDWARE REMOVAL N/A 07/29/2013   Procedure: REMOVAL OF ARCH BARS;  Surgeon: Theodoro Kos, DO;  Location: [place];  Service: Plastics;  Laterality: N/A;  . ORIF MANDIBULAR FRACTURE N/A 06/05/2013   Procedure: REPAIR OF MANDIBULAR FRACTURE x 2 with maxillo-mandibular fixation ;  Surgeon: Theodoro Kos, DO;  Location: [place];  Service: Plastics;  Laterality: N/A;  . PERIPHERAL ARTERIAL STENT GRAFT Left      Prior to Admission medications   Medication Sig Start Date End Date Taking? Authorizing Provider  albuterol (PROVENTIL HFA;VENTOLIN HFA) 108 (90 Base) MCG/ACT inhaler Inhale 2 puffs into the lungs every 6 (six) hours as needed for wheezing or shortness of breath. 04/21/17   Lisa Roca, MD  alum & mag hydroxide-simeth (MAALOX/MYLANTA) 200-200-20 MG/5ML suspension Take 15 mLs every 4 (four) hours as needed by mouth for indigestion or heartburn. Patient not taking: Reported on 04/19/2017 01/25/17   Demetrios Loll, MD  atorvastatin (LIPITOR) 40 MG tablet Take 1 tablet (40 mg total) by mouth daily. 11/16/16   Vaughan Basta, MD  budesonide-formoterol (SYMBICORT) 160-4.5 MCG/ACT inhaler Inhale 2 puffs into the lungs daily. 11/16/16   Vaughan Basta, MD  calcium acetate (PHOSLO) 667 MG capsule Take 2,001 mg by mouth 3 (three) times daily.    [provider]  doxycycline (VIBRA-TABS) 100 MG tablet Take 1  tablet (100 mg total) by mouth 2 (two) times daily. Patient not taking: Reported on 08/28/2017 07/10/17   Harvest Dark, MD  ferrous sulfate 325 (65 FE) MG tablet Take 1 tablet by mouth 3 (three) times daily. 01/21/17   [provider]  furosemide (LASIX) 80 MG tablet Take 1 tablet (80 mg total) daily by mouth. 01/25/17   Demetrios Loll, MD  gabapentin (NEURONTIN) 300 MG capsule Take 1 capsule by mouth daily as directed 08/16/16   [provider]  ipratropium-albuterol (DUONEB) 0.5-2.5 (3) MG/3ML SOLN Take 3 mLs by nebulization every 6 (six) hours as needed. 11/28/16   Loletha Grayer, MD  labetalol (NORMODYNE) 100 MG tablet Take 100 mg by mouth daily. 03/24/17   [provider]  Multiple Vitamins-Minerals-FA (DIALYVITE SUPREME D) 3 MG TABS Take 1 tablet (3 mg total) by mouth daily. 11/28/16   Loletha Grayer, MD  nitroGLYCERIN (NITROSTAT) 0.4 MG SL tablet Place 1 tablet (0.4 mg total) under the tongue every 5 (five) minutes as needed. 04/12/16   Wende Bushy, MD  pantoprazole (PROTONIX) 40 MG tablet Take 1 tablet (  40 mg total) by mouth daily. 11/28/16   Loletha Grayer, MD  Spacer/Aero Chamber Mouthpiece MISC 1 Units by Does not apply route every 4 (four) hours as needed (wheezing). 02/19/17   Darel Hong, MD  tiotropium (SPIRIVA HANDIHALER) 18 MCG inhalation capsule Place 1 capsule (18 mcg total) into inhaler and inhale daily. 11/16/16 11/16/17  Vaughan Basta, MD     Allergies Patient has no known allergies.   Family History  Problem Relation Age of Onset  . Colon cancer Mother   . Cancer Father   . Cancer Sister   . Kidney disease Brother     Social History Social History   Tobacco Use  . Smoking status: Current Every Day Smoker    Packs/day: 0.15    Years: 40.00    Pack years: 6.00    Types: Cigarettes  . Smokeless tobacco: Never Used  Substance Use Topics  . Alcohol use: No    Comment: pt reports quitting after learning about cirrhosis  .  Drug use: No    Frequency: 7.0 times per week    Types: Marijuana, Cocaine    Review of Systems  Constitutional:   No fever or chills.  Cardiovascular:   No chest pain or syncope. Respiratory:   No dyspnea or cough. Gastrointestinal:   Negative for abdominal pain, vomiting and diarrhea.  Musculoskeletal:   Negative for focal pain or swelling All other systems reviewed and are negative except as documented above in ROS and HPI.  ____________________________________________   PHYSICAL EXAM:  VITAL SIGNS: ED Triage Vitals [10/02/17 1141]  Enc Vitals Group     BP 125/83     Pulse Rate 84     Resp 20     Temp 97.8 F (36.6 C)     Temp Source Oral     SpO2 100 %     Weight      Height      Head Circumference      Peak Flow      Pain Score      Pain Loc      Pain Edu?      Excl. in [place]?     Vital signs reviewed, nursing assessments reviewed.   Constitutional:   Alert and oriented. Non-toxic appearance. Eyes:   Conjunctivae are normal. EOMI.  ENT      Head:   Normocephalic and atraumatic.          Mouth/Throat:   MMM, no pharyngeal erythema. No peritonsillar mass.       Neck:   No meningismus. Full ROM. Hematological/Lymphatic/Immunilogical:   No cervical lymphadenopathy. Cardiovascular:   RRR. Symmetric bilateral radial and DP pulses.  No murmurs. Cap refill less than 2 seconds.  Tunneled dialysis catheter exiting the right chest wall. Respiratory:   Normal respiratory effort without tachypnea/retractions. Breath sounds are clear and equal bilaterally. No wheezes/rales/rhonchi. Gastrointestinal:   Soft and nontender.  Moderately distended, not tense. No rebound, rigidity, or guarding. Musculoskeletal:   Normal range of motion in all extremities. No joint effusions.  No lower extremity tenderness.  No edema. Neurologic:   Normal speech and language.  Motor grossly intact. No acute focal neurologic deficits are appreciated.  Skin:    Skin is warm, dry and intact. No  rash noted.  No petechiae, purpura, or bullae.  ____________________________________________    LABS (pertinent positives/negatives) (all labs ordered are listed, but only abnormal results are displayed) Labs Reviewed - No data to display ____________________________________________   EKG  ____________________________________________    RADIOLOGY  No results found.  ____________________________________________   PROCEDURES Procedures  ____________________________________________    CLINICAL IMPRESSION / ASSESSMENT AND PLAN / ED COURSE  Pertinent labs & imaging results that were available during my care of the patient were reviewed by me and considered in my medical decision making (see chart for details).    Patient presents with normal vital signs, no acute complaints, nontoxic-appearing, benign and reassuring physical exam.  Request routine dialysis which the hospital typically provides for him.  Dialysis representative arrived to the bedside immediately after my examination to take him to a care coordination meeting and then onto dialysis.  No acute medical needs or work-up necessary at this time.  No evidence of peritonitis, not requiring therapeutic paracentesis at this time.      ____________________________________________   FINAL CLINICAL IMPRESSION(S) / ED DIAGNOSES    Final diagnoses:  [name] ([place])  Liver cirrhosis with ascites   ED Discharge Orders    None      Portions of this note were generated with dragon dictation software. Dictation errors may occur despite best attempts at proofreading.    Carrie Mew, MD 10/02/17 1220

## 2017-10-02 NOTE — ED Notes (Signed)
Pt here for dialysis

## 2017-10-02 NOTE — Progress Notes (Signed)
Post HD assessment    10/02/17 1650  Neurological  Level of Consciousness Alert  Orientation Level Oriented X4  Respiratory  Respiratory Pattern Regular  Chest Assessment Chest expansion symmetrical  Bilateral Breath Sounds Clear;Diminished  Cough None  Cardiac  Pulse Regular  Heart Sounds S1, S2  ECG Monitor Yes  Vascular  R Radial Pulse +2  L Radial Pulse +2  Edema Generalized  Generalized Edema  (abdominal )  Psychosocial  Psychosocial (WDL) WDL

## 2017-10-02 NOTE — ED Notes (Signed)
Patient transported via wheelchair from dialysis to 18h for reassessment by doctor and discharge paperwork.  Patient stood up and left immediately returning to ED ambulatory with steady gait.  Pt refused to wait for discharge instructions and refused discharge vital signs and signature.  MD notified.

## 2017-10-04 ENCOUNTER — Other Ambulatory Visit: Payer: Self-pay

## 2017-10-04 ENCOUNTER — Non-Acute Institutional Stay
Admission: EM | Admit: 2017-10-04 | Discharge: 2017-10-06 | Disposition: A | Discharge status: Home / Self Care | Payer: Medicare Other | Attending: Emergency Medicine | Admitting: Emergency Medicine

## 2017-10-04 ENCOUNTER — Encounter: Payer: Self-pay | Admitting: Emergency Medicine

## 2017-10-04 DIAGNOSIS — I2510 Atherosclerotic heart disease of native coronary artery without angina pectoris: Secondary | ICD-10-CM | POA: Insufficient documentation

## 2017-10-04 DIAGNOSIS — Z7951 Long term (current) use of inhaled steroids: Secondary | ICD-10-CM | POA: Diagnosis not present

## 2017-10-04 DIAGNOSIS — Z992 Dependence on renal dialysis: Secondary | ICD-10-CM | POA: Insufficient documentation

## 2017-10-04 DIAGNOSIS — I132 Hypertensive heart and chronic kidney disease with heart failure and with stage 5 chronic kidney disease, or end stage renal disease: Secondary | ICD-10-CM | POA: Diagnosis not present

## 2017-10-04 DIAGNOSIS — I739 Peripheral vascular disease, unspecified: Secondary | ICD-10-CM | POA: Insufficient documentation

## 2017-10-04 DIAGNOSIS — J449 Chronic obstructive pulmonary disease, unspecified: Secondary | ICD-10-CM | POA: Insufficient documentation

## 2017-10-04 DIAGNOSIS — I509 Heart failure, unspecified: Secondary | ICD-10-CM | POA: Insufficient documentation

## 2017-10-04 DIAGNOSIS — I120 Hypertensive chronic kidney disease with stage 5 chronic kidney disease or end stage renal disease: Secondary | ICD-10-CM | POA: Diagnosis not present

## 2017-10-04 DIAGNOSIS — N186 End stage renal disease: Secondary | ICD-10-CM | POA: Insufficient documentation

## 2017-10-04 DIAGNOSIS — Z79899 Other long term (current) drug therapy: Secondary | ICD-10-CM | POA: Diagnosis not present

## 2017-10-04 DIAGNOSIS — Z951 Presence of aortocoronary bypass graft: Secondary | ICD-10-CM | POA: Diagnosis not present

## 2017-10-04 MED ORDER — CHLORHEXIDINE GLUCONATE CLOTH 2 % EX PADS
6.0000 | MEDICATED_PAD | Freq: Every day | CUTANEOUS | Status: DC
  Filled 2017-10-04 (×3): qty 6

## 2017-10-04 MED ORDER — HEPARIN SODIUM (PORCINE) 1000 UNIT/ML DIALYSIS
20.0000 [IU]/kg | INTRAMUSCULAR | Status: DC | PRN
  Filled 2017-10-04: qty 2

## 2017-10-04 NOTE — Progress Notes (Signed)
HD Tx started    10/04/17 1210  Vital Signs  Pulse Rate 81  Pulse Rate Source Monitor  Resp (!) 21  BP 133/76  BP Location Left Arm  BP Method Automatic  Patient Position (if appropriate) Lying  Oxygen Therapy  SpO2 100 %  During Hemodialysis Assessment  Blood Flow Rate (mL/min) 400 mL/min  Arterial Pressure (mmHg) -110 mmHg  Venous Pressure (mmHg) 100 mmHg  Transmembrane Pressure (mmHg) 90 mmHg  Ultrafiltration Rate (mL/min) 1170 mL/min  Dialysate Flow Rate (mL/min) 800 ml/min  Conductivity: Machine  13.9  HD Safety Checks Performed Yes  Dialysis Fluid Bolus Normal Saline  Bolus Amount (mL) 250 mL  Intra-Hemodialysis Comments Tx initiated

## 2017-10-04 NOTE — ED Provider Notes (Signed)
[facility] Emergency Department Provider Note  ____________________________________________   First MD Initiated Contact with Patient 10/04/17 1147     (approximate)  I have reviewed the triage vital signs and the nursing notes.   HISTORY  Chief Complaint Vascular Access Problem   HPI Stanley [name] is a 57 y.o. male who self presents to the emergency department for scheduled dialysis via access in his right chest.  He has a long-standing history of end-stage renal disease and receives dialysis Monday Wednesday and Friday.  He has been fired by every outpatient dialysis center in town for aggressive behavior however his nephrologist has been able to arrange a 90-day trial at med and hemodialysis beginning this coming Saturday.  He has no complaints and this is merely scheduled dialysis.    Past Medical History:  Diagnosis Date  . Alcohol abuse   . CHF (congestive heart failure) ([place])   . Cirrhosis ([name])   . Coronary artery disease 2009  . Drug abuse ([place])   . End stage renal disease on dialysis [facility]) NEPHROLOGIST-   DR [facility]  IN [place]   HEMODIALYSIS --   TUES/  THURS/  SAT  . Gastrointestinal bleed 06/13/2017   From chart...hx of multiple GI bleeds  . GERD (gastroesophageal reflux disease)   . Hyperlipidemia   . Hypertension   . PAD (peripheral artery disease) ([place])   . Renal insufficiency    Per pt, 32 oz fluid restriction per day  . S/P triple vessel bypass 06/09/2016   2009ish  . Suicidal ideation    & HOMICIDAL IDEATION --  06-16-2013   ADMITTED TO BEHAVIOR HEALTH    Patient Active Problem List   Diagnosis Date Noted  . ESRD (end stage renal disease) on dialysis ([place]) 07/28/2017  . Protein-calorie malnutrition, severe 06/14/2017  . Encounter for dialysis [facility])   . Palliative care by specialist   . Goals of care, counseling/discussion   . Malnutrition of moderate degree 06/05/2017  . Secondary esophageal varices without  bleeding ([place])   . Stomach irritation   . Idiopathic esophageal varices without bleeding ([place])   . Alcoholic hepatitis with ascites 05/24/2017  . ESRD (end stage renal disease) ([place]) 04/28/2017  . Uremia 03/08/2017  . ESRD on hemodialysis ([place]) 03/03/2017  . Weakness 02/28/2017  . Hypocalcemia 02/22/2017  . Shortness of breath 11/26/2016  . COPD (chronic obstructive pulmonary disease) ([place]) 10/30/2016  . COPD exacerbation ([place]) 10/29/2016  . Anemia   . Heme positive stool   . Ulceration of intestine   . Benign neoplasm of transverse colon   . Acute gastrointestinal hemorrhage   . Esophageal candidiasis ([place])   . Angiodysplasia of intestinal tract   . Acute respiratory failure with hypoxia ([place]) 07/03/2016  . GI bleeding 06/24/2016  . Rectal bleeding 06/14/2016  . Anemia of chronic disease 06/01/2016  . MRSA carrier 06/01/2016  . Chronic renal failure 05/23/2016  . Ischemic heart disease 05/23/2016  . Angiodysplasia of small intestine   . Melena   . Small bowel bleed not requiring more than 4 units of blood in 24 hours, ICU, or surgery   . Anemia due to chronic blood loss   . Abdominal pain 05/05/2016  . Acute posthemorrhagic anemia 04/17/2016  . Gastrointestinal bleed 04/17/2016  . History of esophagogastroduodenoscopy (EGD) 04/17/2016  . Elevated troponin 04/17/2016  . Alcohol abuse 04/17/2016  . Upper GI bleed 01/19/2016  . Blood in stool   . Angiodysplasia of stomach and duodenum with hemorrhage   .  Gastritis   . Reflux esophagitis   . GI bleed 05/16/2015  . Acute GI bleeding   . Symptomatic anemia 04/30/2015  . HTN (hypertension) 04/06/2015  . GERD (gastroesophageal reflux disease) 04/06/2015  . HLD (hyperlipidemia) 04/06/2015  . Dyspnea 04/06/2015  . Cirrhosis of liver with ascites ([place]) 04/06/2015  . Ascites 04/06/2015  . GIB (gastrointestinal bleeding) 03/23/2015  . Homicidal ideation 06/19/2013  . Suicidal intent 06/19/2013  . Homicidal ideations  06/19/2013  . Hyperkalemia 06/16/2013  . Mandible fracture ([place]) 06/05/2013  . Fracture, mandible ([place]) 06/02/2013  . Coronary atherosclerosis of native coronary artery 06/02/2013  . ESRD on dialysis ([place]) 06/02/2013  . Mandible open fracture ([place]) 06/02/2013    Past Surgical History:  Procedure Laterality Date  . A/V FISTULAGRAM Right 06/06/2017   Procedure: A/V FISTULAGRAM;  Surgeon: Katha Cabal, MD;  Location: [place] [facility];  Service: Cardiovascular;  Laterality: Right;  . A/V SHUNT INTERVENTION N/A 06/06/2017   Procedure: A/V SHUNT INTERVENTION;  Surgeon: Katha Cabal, MD;  Location: [place] [facility];  Service: Cardiovascular;  Laterality: N/A;  . AGILE CAPSULE N/A 06/19/2016   Procedure: AGILE CAPSULE;  Surgeon: [name], MD;  Location: [facility] ENDOSCOPY;  Service: Endoscopy;  Laterality: N/A;  . COLONOSCOPY WITH PROPOFOL N/A 06/18/2016   Procedure: COLONOSCOPY WITH PROPOFOL;  Surgeon: [name], MD;  Location: [facility] ENDOSCOPY;  Service: Endoscopy;  Laterality: N/A;  . COLONOSCOPY WITH PROPOFOL N/A 08/12/2016   Procedure: COLONOSCOPY WITH PROPOFOL;  Surgeon: [name], MD;  Location: [facility] ENDOSCOPY;  Service: Endoscopy;  Laterality: N/A;  . COLONOSCOPY WITH PROPOFOL N/A 05/05/2017   Procedure: COLONOSCOPY WITH PROPOFOL;  Surgeon: Manya Silvas, MD;  Location: [facility] ENDOSCOPY;  Service: Endoscopy;  Laterality: N/A;  . CORONARY ANGIOPLASTY  ?   PT UNABLE TO TELL IF  BEFORE OR AFTER  CABG  . CORONARY ARTERY BYPASS GRAFT  2008  ([place])   3 VESSEL  . DIALYSIS FISTULA CREATION  LAST SURGERY  APPOX  2008  . ENTEROSCOPY N/A 05/10/2016   Procedure: ENTEROSCOPY;  Surgeon: Jerene Bears, MD;  Location: [place];  Service: Gastroenterology;  Laterality: N/A;  . ENTEROSCOPY N/A 08/12/2016   Procedure: ENTEROSCOPY;  Surgeon: [name], MD;  Location: [facility] ENDOSCOPY;  Service: Endoscopy;  Laterality: N/A;  . ENTEROSCOPY Left 06/03/2017   Procedure: ENTEROSCOPY;   Surgeon: Virgel Manifold, MD;  Location: [facility] ENDOSCOPY;  Service: Endoscopy;  Laterality: Left;  Procedure date will ultimately depend on when patient is medically optimized before the procedure, pending hemodialysis and blood transfusions etc. Will place on schedule and change depending on clinical status.   . ENTEROSCOPY N/A 06/05/2017   Procedure: ENTEROSCOPY;  Surgeon: Virgel Manifold, MD;  Location: [facility] ENDOSCOPY;  Service: Endoscopy;  Laterality: N/A;  . ENTEROSCOPY N/A 06/15/2017   Procedure: Push ENTEROSCOPY;  Surgeon: [name], MD;  Location: [facility] ENDOSCOPY;  Service: Endoscopy;  Laterality: N/A;  . ESOPHAGOGASTRODUODENOSCOPY N/A 05/07/2015   Procedure: ESOPHAGOGASTRODUODENOSCOPY (EGD);  Surgeon: Hulen Luster, MD;  Location: [facility] ENDOSCOPY;  Service: Endoscopy;  Laterality: N/A;  . ESOPHAGOGASTRODUODENOSCOPY (EGD) WITH PROPOFOL N/A 05/17/2015   Procedure: ESOPHAGOGASTRODUODENOSCOPY (EGD) WITH PROPOFOL;  Surgeon: [name], MD;  Location: [facility] ENDOSCOPY;  Service: Endoscopy;  Laterality: N/A;  . ESOPHAGOGASTRODUODENOSCOPY (EGD) WITH PROPOFOL N/A 01/20/2016   Procedure: ESOPHAGOGASTRODUODENOSCOPY (EGD) WITH PROPOFOL;  Surgeon: [name], MD;  Location: [facility] ENDOSCOPY;  Service: Endoscopy;  Laterality: N/A;  . ESOPHAGOGASTRODUODENOSCOPY (EGD) WITH PROPOFOL N/A 04/17/2016   Procedure:  ESOPHAGOGASTRODUODENOSCOPY (EGD) WITH PROPOFOL;  Surgeon: Lin Landsman, MD;  Location: [facility] ENDOSCOPY;  Service: Gastroenterology;  Laterality: N/A;  . ESOPHAGOGASTRODUODENOSCOPY (EGD) WITH PROPOFOL  05/09/2016   Procedure: ESOPHAGOGASTRODUODENOSCOPY (EGD) WITH PROPOFOL;  Surgeon: Jerene Bears, MD;  Location: [place];  Service: Endoscopy;;  . ESOPHAGOGASTRODUODENOSCOPY (EGD) WITH PROPOFOL N/A 06/16/2016   Procedure: ESOPHAGOGASTRODUODENOSCOPY (EGD) WITH PROPOFOL;  Surgeon: [name], MD;  Location: [facility] ENDOSCOPY;  Service: Endoscopy;  Laterality: N/A;  . ESOPHAGOGASTRODUODENOSCOPY (EGD) WITH  PROPOFOL N/A 05/05/2017   Procedure: ESOPHAGOGASTRODUODENOSCOPY (EGD) WITH PROPOFOL;  Surgeon: Manya Silvas, MD;  Location: [facility] ENDOSCOPY;  Service: Endoscopy;  Laterality: N/A;  . ESOPHAGOGASTRODUODENOSCOPY (EGD) WITH PROPOFOL N/A 06/15/2017   Procedure: ESOPHAGOGASTRODUODENOSCOPY (EGD) WITH PROPOFOL;  Surgeon: [name], MD;  Location: [facility] ENDOSCOPY;  Service: Endoscopy;  Laterality: N/A;  . GIVENS CAPSULE STUDY N/A 05/07/2016   Procedure: GIVENS CAPSULE STUDY;  Surgeon: Doran Stabler, MD;  Location: [place];  Service: Endoscopy;  Laterality: N/A;  . MANDIBULAR HARDWARE REMOVAL N/A 07/29/2013   Procedure: REMOVAL OF ARCH BARS;  Surgeon: Theodoro Kos, DO;  Location: [place];  Service: Plastics;  Laterality: N/A;  . ORIF MANDIBULAR FRACTURE N/A 06/05/2013   Procedure: REPAIR OF MANDIBULAR FRACTURE x 2 with maxillo-mandibular fixation ;  Surgeon: Theodoro Kos, DO;  Location: [place];  Service: Plastics;  Laterality: N/A;  . PERIPHERAL ARTERIAL STENT GRAFT Left     Prior to Admission medications   Medication Sig Start Date End Date Taking? Authorizing Provider  albuterol (PROVENTIL HFA;VENTOLIN HFA) 108 (90 Base) MCG/ACT inhaler Inhale 2 puffs into the lungs every 6 (six) hours as needed for wheezing or shortness of breath. 04/21/17   Lisa Roca, MD  alum & mag hydroxide-simeth (MAALOX/MYLANTA) 200-200-20 MG/5ML suspension Take 15 mLs every 4 (four) hours as needed by mouth for indigestion or heartburn. Patient not taking: Reported on 04/19/2017 01/25/17   Demetrios Loll, MD  atorvastatin (LIPITOR) 40 MG tablet Take 1 tablet (40 mg total) by mouth daily. 11/16/16   Vaughan Basta, MD  budesonide-formoterol (SYMBICORT) 160-4.5 MCG/ACT inhaler Inhale 2 puffs into the lungs daily. 11/16/16   Vaughan Basta, MD  calcium acetate (PHOSLO) 667 MG capsule Take 2,001 mg by mouth 3 (three) times daily.    [provider]  doxycycline (VIBRA-TABS) 100 MG tablet  Take 1 tablet (100 mg total) by mouth 2 (two) times daily. Patient not taking: Reported on 08/28/2017 07/10/17   Harvest Dark, MD  ferrous sulfate 325 (65 FE) MG tablet Take 1 tablet by mouth 3 (three) times daily. 01/21/17   [provider]  furosemide (LASIX) 80 MG tablet Take 1 tablet (80 mg total) daily by mouth. 01/25/17   Demetrios Loll, MD  gabapentin (NEURONTIN) 300 MG capsule Take 1 capsule by mouth daily as directed 08/16/16   [provider]  ipratropium-albuterol (DUONEB) 0.5-2.5 (3) MG/3ML SOLN Take 3 mLs by nebulization every 6 (six) hours as needed. 11/28/16   Loletha Grayer, MD  labetalol (NORMODYNE) 100 MG tablet Take 100 mg by mouth daily. 03/24/17   [provider]  Multiple Vitamins-Minerals-FA (DIALYVITE SUPREME D) 3 MG TABS Take 1 tablet (3 mg total) by mouth daily. 11/28/16   Loletha Grayer, MD  nitroGLYCERIN (NITROSTAT) 0.4 MG SL tablet Place 1 tablet (0.4 mg total) under the tongue every 5 (five) minutes as needed. 04/12/16   Wende Bushy, MD  pantoprazole (PROTONIX) 40 MG tablet Take 1 tablet (40 mg total) by mouth daily. 11/28/16  Loletha Grayer, MD  Spacer/Aero Chamber Mouthpiece MISC 1 Units by Does not apply route every 4 (four) hours as needed (wheezing). 02/19/17   Darel Hong, MD  tiotropium (SPIRIVA HANDIHALER) 18 MCG inhalation capsule Place 1 capsule (18 mcg total) into inhaler and inhale daily. 11/16/16 11/16/17  Vaughan Basta, MD    Allergies Patient has no known allergies.  Family History  Problem Relation Age of Onset  . Colon cancer Mother   . Cancer Father   . Cancer Sister   . Kidney disease Brother     Social History Social History   Tobacco Use  . Smoking status: Current Every Day Smoker    Packs/day: 0.15    Years: 40.00    Pack years: 6.00    Types: Cigarettes  . Smokeless tobacco: Never Used  Substance Use Topics  . Alcohol use: No    Comment: pt reports quitting after learning about cirrhosis  .  Drug use: No    Frequency: 7.0 times per week    Types: Marijuana, Cocaine    Review of Systems Constitutional: No fever/chills Cardiovascular: Denies chest pain. Respiratory: Denies shortness of breath. Gastrointestinal: No abdominal pain.  No nausea, no vomiting. Musculoskeletal: Negative for back pain. Neurological: Negative for headaches   ____________________________________________   PHYSICAL EXAM:  VITAL SIGNS: ED Triage Vitals  Enc Vitals Group     BP 10/04/17 1141 (!) 141/76     Pulse Rate 10/04/17 1141 81     Resp 10/04/17 1141 20     Temp 10/04/17 1141 98 F (36.7 C)     Temp Source 10/04/17 1141 Oral     SpO2 10/04/17 1141 99 %     Weight 10/04/17 1139 179 lb (81.2 kg)     Height 10/04/17 1139 6\' 3"  (1.905 m)     Head Circumference --      Peak Flow --      Pain Score 10/04/17 1139 0     Pain Loc --      Pain Edu? --      Excl. in [place]? --     Constitutional: Alert and oriented x4 pleasant cooperative no distress Head: Atraumatic. Nose: No congestion/rhinnorhea. Mouth/Throat: No trismus Neck: No stridor.   Cardiovascular: Regular rate and rhythm Respiratory: Normal respiratory effort.  No retractions. Neurologic:  Normal speech and language. No gross focal neurologic deficits are appreciated.  Skin:  Skin is warm, dry and intact. No rash noted.    ____________________________________________  LABS (all labs ordered are listed, but only abnormal results are displayed)  Labs Reviewed - No data to display   __________________________________________  EKG   ____________________________________________  RADIOLOGY   ____________________________________________   DIFFERENTIAL includes but not limited to  Dialysis, hyperkalemia, fluid overload   PROCEDURES  Procedure(s) performed: no  Procedures  Critical Care performed: no  ____________________________________________   INITIAL IMPRESSION / ASSESSMENT AND PLAN / ED  COURSE  Pertinent labs & imaging results that were available during my care of the patient were reviewed by me and considered in my medical decision making (see chart for details).       As part of my medical decision making, I reviewed the following data within the [place] History obtained from family if available, nursing notes, old chart and ekg, as well as notes from prior ED visits.  The patient has no acute medical complaints and he will be dialyzed and discharged home. ____________________________________________   FINAL CLINICAL IMPRESSION(S) / ED DIAGNOSES  Final diagnoses:  End stage chronic kidney disease ([place])      NEW MEDICATIONS STARTED DURING THIS VISIT:  Discharge Medication List as of 10/04/2017 11:54 AM       Note:  This document was prepared using Dragon voice recognition software and may include unintentional dictation errors.      Darel Hong, MD 10/06/17 1317

## 2017-10-04 NOTE — Progress Notes (Signed)
Pre HD Tx   10/04/17 1200  Vital Signs  Temp 98 F (36.7 C)  Temp Source Oral  Pulse Rate 81  Pulse Rate Source Monitor  Resp 20  BP 131/80  BP Location Left Arm  BP Method Automatic  Patient Position (if appropriate) Lying  Oxygen Therapy  SpO2 100 %  O2 Device Room Air  Pain Assessment  Pain Scale 0-10  Pain Score 0  Dialysis Weight  Weight 81.1 kg (178 lb 12.7 oz)  Type of Weight Pre-Dialysis  Time-Out for Hemodialysis  What Procedure? HD  Pt Identifiers(min of two) First/Last Name;MRN/Account#  Correct Site? Yes  Correct Side? Yes  Correct Procedure? Yes  Consents Verified? Yes  Rad Studies Available? N/A  Safety Precautions Reviewed? Yes  Machine [employer] Number [id number]  Station Number 3  UF/Alarm Test Passed  Conductivity: Meter 14  Conductivity: Machine  14  pH 7.4  Reverse Osmosis Main  Normal Saline Lot Number [id number]  Dialyzer Lot Number [id number]  Disposable Set Lot Number [id number]  Machine Temperature 98.6 F (37 C)  Musician and Audible Yes  Blood Lines Intact and Secured Yes  Pre Treatment Patient Checks  Vascular access used during treatment Catheter  Patient is receiving dialysis in a chair Yes  Hepatitis B Surface Antigen Results Negative  Date Hepatitis B Surface Antigen Drawn 06/16/17  Isolation Initiated Yes  Hepatitis B Surface Antibody  (>10)  Date Hepatitis B Surface Antibody Drawn 06/16/17  Hemodialysis Consent Verified Yes  Hemodialysis Standing Orders Initiated Yes  ECG (Telemetry) Monitor On Yes  Prime Ordered Normal Saline  Length of  DialysisTreatment -hour(s) 3.5 Hour(s)  Dialysis Treatment Comments Na 140  Dialyzer Elisio 17H NR  Dialysate 2K, 2.5 Ca  Dialysis Anticoagulant None  Dialysate Flow Ordered 800  Blood Flow Rate Ordered 400 mL/min  Ultrafiltration Goal 4 Liters  Pre Treatment Labs Renal panel;CBC  Dialysis Blood Pressure Support Ordered Normal Saline  Education / Care Plan  Dialysis Education  Provided Yes  Documented Education in Care Plan Yes  Hemodialysis Catheter Right Internal jugular Double-lumen  No Placement Date or Time found.   Placed prior to admission: Yes  Orientation: Right  Access Location: Internal jugular  Hemodialysis Catheter Type: Double-lumen  Site Condition No complications  Blue Lumen Status Flushed  Red Lumen Status Flushed  Dressing Type Occlusive;Biopatch  Dressing Status Clean;Dry;Intact

## 2017-10-04 NOTE — Progress Notes (Signed)
HD Tx completed, uf goal met.    10/04/17 1515  Hand-Off documentation  Report given to (Full Name) Elohim [place], RN ED [phone]  Report received from (Full Name) Beatris Ship, RN   Vital Signs  Temp 98.1 F (36.7 C)  Temp Source Oral  Pulse Rate 83  Pulse Rate Source Monitor  Resp 18  BP 122/73  BP Location Left Arm  BP Method Automatic  Patient Position (if appropriate) Sitting  Oxygen Therapy  SpO2 100 %  O2 Device Room Air  Pain Assessment  Pain Scale 0-10  Pain Score 0  During Hemodialysis Assessment  HD Safety Checks Performed Yes  Dialysis Fluid Bolus Normal Saline  Bolus Amount (mL) 250 mL  Intra-Hemodialysis Comments Tx completed  Post-Hemodialysis Assessment  Rinseback Volume (mL) 250 mL  Dialyzer Clearance Clotted  Duration of HD Treatment -hour(s) 3.5 hour(s)  Hemodialysis Intake (mL) 500 mL  UF Total -Machine (mL) 4017 mL  Net UF (mL) 3517 mL  Tolerated HD Treatment Yes  Hemodialysis Catheter Right Internal jugular Double-lumen  No Placement Date or Time found.   Placed prior to admission: Yes  Orientation: Right  Access Location: Internal jugular  Hemodialysis Catheter Type: Double-lumen  Site Condition No complications  Blue Lumen Status Heparin locked  Red Lumen Status Heparin locked  Dressing Status Clean;Dry;Intact  Post treatment catheter status Capped and Clamped

## 2017-10-04 NOTE — ED Notes (Signed)
Patient transported to dialysis

## 2017-10-04 NOTE — Discharge Instructions (Signed)
Please keep your appointment at [facility] hemodialysis this coming Saturday for your first outpatient treatment and return to the emergency department for any issues whatsoever.  It was a pleasure to take care of you today, and thank you for coming to our emergency department.  If you have any questions or concerns before leaving please ask the nurse to grab me and I'm more than happy to go through your aftercare instructions again.  If you have any concerns once you are home that you are not improving or are in fact getting worse before you can make it to your follow-up appointment, please do not hesitate to call [phone] and come back for further evaluation.  Darel Hong, MD

## 2017-10-04 NOTE — Progress Notes (Signed)
Post HD Assessment, tx tolerated well, ended 20 min short due to clotting.    10/04/17 1520  Neurological  Level of Consciousness Alert  Orientation Level Oriented X4  Respiratory  Respiratory Pattern Regular  Chest Assessment Chest expansion symmetrical  Bilateral Breath Sounds Clear;Diminished  Cough None  Cardiac  Pulse Regular  Heart Sounds S1, S2  ECG Monitor Yes  Vascular  R Radial Pulse +2  L Radial Pulse +2  Edema Generalized  Generalized Edema  (abdominal )  Psychosocial  Psychosocial (WDL) WDL

## 2017-10-04 NOTE — Progress Notes (Signed)
Pre HD assessment    10/04/17 1200  Neurological  Level of Consciousness Alert  Orientation Level Oriented X4  Respiratory  Respiratory Pattern Regular  Chest Assessment Chest expansion symmetrical  Bilateral Breath Sounds Clear;Diminished  Cough None  Cardiac  Pulse Regular  Heart Sounds S1, S2  ECG Monitor Yes  Vascular  R Radial Pulse +2  L Radial Pulse +2  Edema Generalized  Generalized Edema  (abdominal )  Psychosocial  Psychosocial (WDL) WDL

## 2017-10-04 NOTE — ED Notes (Signed)
Pt comes to ED for dialysis treatment, pt denies any complaints at this time.

## 2017-10-04 NOTE — ED Triage Notes (Signed)
Pt comes into the ED via POV c/o need for dialysis.  Patient has no other complaints at this time.

## 2017-10-04 NOTE — ED Notes (Signed)
Notified dialysis RN and Dr. [name] that pt is here for dialysis treatment.

## 2017-10-06 ENCOUNTER — Non-Acute Institutional Stay
Admission: EM | Admit: 2017-10-06 | Discharge: 2017-10-06 | Disposition: A | Discharge status: Home / Self Care | Payer: Medicare Other | Attending: Nephrology | Admitting: Nephrology

## 2017-10-06 ENCOUNTER — Encounter: Payer: Self-pay | Admitting: Emergency Medicine

## 2017-10-06 ENCOUNTER — Ambulatory Visit
Admit: 2017-10-06 | Discharge: 2017-10-06 | Disposition: A | Discharge status: Home / Self Care | Payer: Medicare Other | Attending: Nephrology | Admitting: Nephrology

## 2017-10-06 ENCOUNTER — Other Ambulatory Visit: Payer: Self-pay | Admitting: Nephrology

## 2017-10-06 DIAGNOSIS — E875 Hyperkalemia: Secondary | ICD-10-CM | POA: Diagnosis not present

## 2017-10-06 DIAGNOSIS — D631 Anemia in chronic kidney disease: Secondary | ICD-10-CM | POA: Insufficient documentation

## 2017-10-06 DIAGNOSIS — I739 Peripheral vascular disease, unspecified: Secondary | ICD-10-CM | POA: Insufficient documentation

## 2017-10-06 DIAGNOSIS — R188 Other ascites: Secondary | ICD-10-CM

## 2017-10-06 DIAGNOSIS — I272 Pulmonary hypertension, unspecified: Secondary | ICD-10-CM | POA: Diagnosis not present

## 2017-10-06 DIAGNOSIS — E785 Hyperlipidemia, unspecified: Secondary | ICD-10-CM | POA: Insufficient documentation

## 2017-10-06 DIAGNOSIS — I2510 Atherosclerotic heart disease of native coronary artery without angina pectoris: Secondary | ICD-10-CM | POA: Insufficient documentation

## 2017-10-06 DIAGNOSIS — N186 End stage renal disease: Secondary | ICD-10-CM | POA: Diagnosis not present

## 2017-10-06 DIAGNOSIS — Z992 Dependence on renal dialysis: Secondary | ICD-10-CM | POA: Insufficient documentation

## 2017-10-06 DIAGNOSIS — I120 Hypertensive chronic kidney disease with stage 5 chronic kidney disease or end stage renal disease: Secondary | ICD-10-CM | POA: Insufficient documentation

## 2017-10-06 DIAGNOSIS — Z79899 Other long term (current) drug therapy: Secondary | ICD-10-CM | POA: Diagnosis not present

## 2017-10-06 DIAGNOSIS — Q8781 Alport syndrome: Secondary | ICD-10-CM | POA: Diagnosis not present

## 2017-10-06 MED ORDER — CHLORHEXIDINE GLUCONATE CLOTH 2 % EX PADS
6.0000 | MEDICATED_PAD | Freq: Every day | CUTANEOUS | Status: DC
  Filled 2017-10-06: qty 6

## 2017-10-06 NOTE — Progress Notes (Signed)
[facility]  ROUNDING NOTE   Subjective:   Seen and examined during HD Paracentesis earlier today - 7 L removed Still has abdominal distension   HEMODIALYSIS FLOWSHEET:  Blood Flow Rate (mL/min): 400 mL/min Arterial Pressure (mmHg): -140 mmHg Venous Pressure (mmHg): 160 mmHg Transmembrane Pressure (mmHg): 70 mmHg Ultrafiltration Rate (mL/min): 1270 mL/min Dialysate Flow Rate (mL/min): 800 ml/min Conductivity: Machine : 14 Conductivity: Machine : 14 Dialysis Fluid Bolus: Normal Saline Bolus Amount (mL): 250 mL     Objective:  Vital signs in last 24 hours:  Temp:  [97.7 F (36.5 C)] 97.7 F (36.5 C) (07/26 1340) Pulse Rate:  [79-81] 79 (07/26 1415) Resp:  [19-20] 19 (07/26 1415) BP: (134-165)/(84-92) 165/84 (07/26 1415) SpO2:  [96 %-100 %] 97 % (07/26 1415) Weight:  [81.2 kg (179 lb)] 81.2 kg (179 lb) (07/26 1156)  Weight change:  Filed Weights   10/06/17 1156  Weight: 81.2 kg (179 lb)    Intake/Output: No intake/output data recorded.   Intake/Output this shift:  No intake/output data recorded.  Physical Exam: General: No acute distress  Head: Normocephalic, atraumatic. Moist oral mucosal membranes  Eyes: Anicteric  Neck: Supple, trachea midline  Lungs:  Clear to auscultation, normal effort  Heart: S1S2 no rubs, prominent systolic murmur  Abdomen:  + distended, ascites  Extremities: + peripheral edema.  Neurologic: nonfocal  Skin: No lesions  Access: R IJ permcath    Basic Metabolic Panel: No results for input(s): NA, K, CL, CO2, GLUCOSE, BUN, CREATININE, CALCIUM, MG, PHOS in the last 168 hours.  Liver Function Tests: No results for input(s): AST, ALT, ALKPHOS, BILITOT, PROT, ALBUMIN in the last 168 hours. No results for input(s): LIPASE, AMYLASE in the last 168 hours. No results for input(s): AMMONIA in the last 168 hours.  CBC: No results for input(s): WBC, NEUTROABS, HGB, HCT, MCV, PLT in the last 168 hours.  Cardiac Enzymes: No  results for input(s): CKTOTAL, CKMB, CKMBINDEX, TROPONINI in the last 168 hours.  BNP: Invalid input(s): POCBNP  CBG: No results for input(s): GLUCAP in the last 168 hours.  Microbiology: Results for orders placed or performed during the hospital encounter of 07/12/17  Culture, blood (Routine X 2) w Reflex to ID Panel     Status: None   Collection Time: 07/12/17  4:43 PM  Result Value Ref Range Status   Specimen Description BLOOD LINE CENTRAL  Final   Special Requests   Final    BOTTLES DRAWN AEROBIC AND ANAEROBIC Blood Culture adequate volume   Culture   Final    NO GROWTH 5 DAYS Performed at [facility], [address] [place], [place] [address]    Report Status 07/17/2017 FINAL  Final    Coagulation Studies: No results for input(s): LABPROT, INR in the last 72 hours.  Urinalysis: No results for input(s): COLORURINE, LABSPEC, PHURINE, GLUCOSEU, HGBUR, BILIRUBINUR, KETONESUR, PROTEINUR, UROBILINOGEN, NITRITE, LEUKOCYTESUR in the last 72 hours.  Invalid input(s): APPERANCEUR    Imaging: US Paracentesis  Result Date: 10/06/2017 INDICATION: 57 year old with recurrent ascites and end-stage renal disease. Patient presents for a therapeutic paracentesis. EXAM: ULTRASOUND GUIDED THERAPEUTIC PARACENTESIS MEDICATIONS: None. COMPLICATIONS: None immediate. PROCEDURE: Informed written consent was obtained from the patient after a discussion of the risks, benefits and alternatives to treatment. A timeout was performed prior to the initiation of the procedure. Initial ultrasound scanning demonstrates a large amount of ascites within the right lower abdominal quadrant. The right lower abdomen was prepped and draped in the usual sterile  fashion. 1% lidocaine was used for local anesthesia. Following this, a 6 Fr Safe-T-Centesis catheter was introduced. An ultrasound image was saved for documentation purposes. The paracentesis was performed. The catheter was removed and a dressing was  applied. The patient tolerated the procedure well without immediate post procedural complication. FINDINGS: A total of approximately 7 L of amber fluid was removed. IMPRESSION: Successful ultrasound-guided paracentesis yielding 7 liters of peritoneal fluid. Electronically Signed   By: [name] M.D.   On: 10/06/2017 14:06     Medications:    . [START ON 10/07/2017] Chlorhexidine Gluconate Cloth  6 each Topical Q0600     Assessment/ Plan:  57 y.o. black male withend stage renal disease on hemodialysis secondary to Alport's syndrome, ascites, hypertension, anemia of chronic kidney disease, coronary artery disease, peripheral vascular disease, hyperlipidemia, gastrointestinal AVMs, pulmonary hypertension  nooutpatientdialysis unit currently  1. Hyperkalemia:   - 2K bath on hemodialysis - Low Potassium diet  2.  End-stage renal disease:   MWF - Seen on hemodialysis today. Tolerating treatment well.  - starting outpatient in [place] [facility] tomorrow   3. Ascites: status post large volume ultrasound guided paracentesis on 7/26.;  -  7 L removed  - UF with HD as tolerated - goal 4-5 L as tolerated  4. Care coordination Patient has been accepted to [facility] HD tentatively for 90 day under behavior contract starting saturday     LOS: 0 Merida Alcantar 7/26/20192:59 PM

## 2017-10-06 NOTE — Progress Notes (Signed)
Hd started  

## 2017-10-06 NOTE — ED Notes (Signed)
Patient denies pain and is resting comfortably.  

## 2017-10-06 NOTE — Procedures (Signed)
US guided paracentesis.  Removed 7 liters of fluid.  Minimal blood loss and no immediate complication.

## 2017-10-06 NOTE — Progress Notes (Signed)
Post dialysis assessment 

## 2017-10-06 NOTE — ED Provider Notes (Signed)
Patient is here for routine dialysis.  He states he feels well and has no complaints.  No shortness of breath.  No chest pain.  Patient well-appearing with no tachypnea or increased respiratory effort.  Lungs are clear.  Heart rate is normal.  Ready for dialysis   Lavonia Drafts, MD 10/06/17 1315

## 2017-10-06 NOTE — Progress Notes (Signed)
Hd completed 

## 2017-10-06 NOTE — ED Triage Notes (Signed)
Pt here for paracentesis and then dialysis. Denies any complaints or concerns.

## 2017-10-07 DIAGNOSIS — Z992 Dependence on renal dialysis: Secondary | ICD-10-CM | POA: Diagnosis not present

## 2017-10-07 DIAGNOSIS — N186 End stage renal disease: Secondary | ICD-10-CM | POA: Diagnosis not present

## 2017-10-10 DIAGNOSIS — N186 End stage renal disease: Secondary | ICD-10-CM | POA: Diagnosis not present

## 2017-10-10 DIAGNOSIS — Z992 Dependence on renal dialysis: Secondary | ICD-10-CM | POA: Diagnosis not present

## 2017-10-12 DIAGNOSIS — D631 Anemia in chronic kidney disease: Secondary | ICD-10-CM | POA: Diagnosis not present

## 2017-10-12 DIAGNOSIS — N2581 Secondary hyperparathyroidism of renal origin: Secondary | ICD-10-CM | POA: Diagnosis not present

## 2017-10-12 DIAGNOSIS — D509 Iron deficiency anemia, unspecified: Secondary | ICD-10-CM | POA: Diagnosis not present

## 2017-10-12 DIAGNOSIS — T782XXA Anaphylactic shock, unspecified, initial encounter: Secondary | ICD-10-CM | POA: Diagnosis not present

## 2017-10-12 DIAGNOSIS — N186 End stage renal disease: Secondary | ICD-10-CM | POA: Diagnosis not present

## 2017-10-12 DIAGNOSIS — Z992 Dependence on renal dialysis: Secondary | ICD-10-CM | POA: Diagnosis not present

## 2017-10-14 DIAGNOSIS — N186 End stage renal disease: Secondary | ICD-10-CM | POA: Diagnosis not present

## 2017-10-14 DIAGNOSIS — Z992 Dependence on renal dialysis: Secondary | ICD-10-CM | POA: Diagnosis not present

## 2017-10-14 DIAGNOSIS — N2581 Secondary hyperparathyroidism of renal origin: Secondary | ICD-10-CM | POA: Diagnosis not present

## 2017-10-14 DIAGNOSIS — T782XXA Anaphylactic shock, unspecified, initial encounter: Secondary | ICD-10-CM | POA: Diagnosis not present

## 2017-10-14 DIAGNOSIS — D509 Iron deficiency anemia, unspecified: Secondary | ICD-10-CM | POA: Diagnosis not present

## 2017-10-17 DIAGNOSIS — Z992 Dependence on renal dialysis: Secondary | ICD-10-CM | POA: Diagnosis not present

## 2017-10-17 DIAGNOSIS — Z1389 Encounter for screening for other disorder: Secondary | ICD-10-CM | POA: Diagnosis not present

## 2017-10-17 DIAGNOSIS — J449 Chronic obstructive pulmonary disease, unspecified: Secondary | ICD-10-CM | POA: Diagnosis not present

## 2017-10-17 DIAGNOSIS — D509 Iron deficiency anemia, unspecified: Secondary | ICD-10-CM | POA: Diagnosis not present

## 2017-10-17 DIAGNOSIS — N2581 Secondary hyperparathyroidism of renal origin: Secondary | ICD-10-CM | POA: Diagnosis not present

## 2017-10-17 DIAGNOSIS — T782XXA Anaphylactic shock, unspecified, initial encounter: Secondary | ICD-10-CM | POA: Diagnosis not present

## 2017-10-17 DIAGNOSIS — N186 End stage renal disease: Secondary | ICD-10-CM | POA: Diagnosis not present

## 2017-10-20 ENCOUNTER — Encounter: Payer: Self-pay | Admitting: Emergency Medicine

## 2017-10-20 ENCOUNTER — Emergency Department
Admission: EM | Admit: 2017-10-20 | Discharge: 2017-10-20 | Disposition: A | Discharge status: Home / Self Care | Payer: Medicare Other | Attending: Student in an Organized Health Care Education/Training Program | Admitting: Student in an Organized Health Care Education/Training Program

## 2017-10-20 ENCOUNTER — Ambulatory Visit
Admission: RE | Admit: 2017-10-20 | Discharge: 2017-10-20 | Disposition: A | Discharge status: Home / Self Care | Payer: Medicare Other | Source: Ambulatory Visit | Attending: Nephrology | Admitting: Nephrology

## 2017-10-20 ENCOUNTER — Other Ambulatory Visit: Payer: Self-pay

## 2017-10-20 DIAGNOSIS — D631 Anemia in chronic kidney disease: Secondary | ICD-10-CM | POA: Diagnosis not present

## 2017-10-20 DIAGNOSIS — Z992 Dependence on renal dialysis: Secondary | ICD-10-CM

## 2017-10-20 DIAGNOSIS — E875 Hyperkalemia: Secondary | ICD-10-CM | POA: Diagnosis not present

## 2017-10-20 DIAGNOSIS — N186 End stage renal disease: Secondary | ICD-10-CM | POA: Diagnosis not present

## 2017-10-20 DIAGNOSIS — Z4901 Encounter for fitting and adjustment of extracorporeal dialysis catheter: Secondary | ICD-10-CM | POA: Diagnosis present

## 2017-10-20 DIAGNOSIS — R188 Other ascites: Secondary | ICD-10-CM | POA: Insufficient documentation

## 2017-10-20 LAB — RENAL FUNCTION PANEL
ALBUMIN: 2.6 g/dL — AB (ref 3.5–5.0)
Anion gap: 13 (ref 5–15)
BUN: 47 mg/dL — ABNORMAL HIGH (ref 6–20)
CALCIUM: 7.9 mg/dL — AB (ref 8.9–10.3)
CO2: 26 mmol/L (ref 22–32)
CREATININE: 10.2 mg/dL — AB (ref 0.61–1.24)
Chloride: 100 mmol/L (ref 98–111)
GFR, EST AFRICAN AMERICAN: 6 mL/min — AB (ref >60)
GFR, EST NON AFRICAN AMERICAN: 5 mL/min — AB (ref >60)
Glucose, Bld: 153 mg/dL — ABNORMAL HIGH (ref 70–99)
PHOSPHORUS: 5.6 mg/dL — AB (ref 2.5–4.6)
Potassium: 5 mmol/L (ref 3.5–5.1)
Sodium: 139 mmol/L (ref 135–145)

## 2017-10-20 LAB — CBC
HCT: 29.2 % — ABNORMAL LOW (ref 40.0–52.0)
Hemoglobin: 8.9 g/dL — ABNORMAL LOW (ref 13.0–18.0)
MCH: 22.3 pg — ABNORMAL LOW (ref 26.0–34.0)
MCHC: 30.3 g/dL — ABNORMAL LOW (ref 32.0–36.0)
MCV: 73.6 fL — AB (ref 80.0–100.0)
PLATELETS: 264 10*3/uL (ref 150–440)
RBC: 3.97 MIL/uL — AB (ref 4.40–5.90)
RDW: 19.4 % — ABNORMAL HIGH (ref 11.5–14.5)
WBC: 6.4 10*3/uL (ref 3.8–10.6)

## 2017-10-20 MED ORDER — ALBUMIN HUMAN 25 % IV SOLN
INTRAVENOUS | Status: AC
  Filled 2017-10-20: qty 200

## 2017-10-20 MED ORDER — ALBUMIN HUMAN 25 % IV SOLN
12.5000 g | Freq: Once | INTRAVENOUS | Status: DC
  Filled 2017-10-20: qty 50

## 2017-10-20 MED ORDER — DIPHENHYDRAMINE HCL 50 MG/ML IJ SOLN
12.5000 mg | Freq: Once | INTRAMUSCULAR | Status: AC
  Administered 2017-10-20: 50 mg via INTRAVENOUS

## 2017-10-20 MED ORDER — ALBUMIN HUMAN 25 % IV SOLN
25.0000 g | Freq: Once | INTRAVENOUS | Status: AC
  Administered 2017-10-20: 25 g via INTRAVENOUS

## 2017-10-20 MED ORDER — CHLORHEXIDINE GLUCONATE CLOTH 2 % EX PADS: 6.0000 | MEDICATED_PAD | Freq: Every day | CUTANEOUS | Status: DC

## 2017-10-20 MED ORDER — ALBUMIN HUMAN 25 % IV SOLN: 12.5000 g | Freq: Once | INTRAVENOUS | Status: DC

## 2017-10-20 NOTE — Progress Notes (Signed)
Hd discontinued per patient request due to pain in left great toe, unable to continue treatment.

## 2017-10-20 NOTE — ED Provider Notes (Deleted)
[facility] Emergency Department Provider Note       Time seen: ----------------------------------------- 5:27 PM on 10/20/2017 -----------------------------------------   I have reviewed the triage vital signs and the nursing notes.  HISTORY   Chief Complaint Vascular Access Problem    HPI Stanley [name] is a 57 y.o. male with a history of alcohol abuse, CHF, cirrhosis, coronary disease, drug abuse, end-stage renal disease, renal insufficiency, hypertension, hyperlipidemia who presents to the ED for reevaluation after dialysis.  Patient has no complaints at this time.  Past Medical History:  Diagnosis Date  . Alcohol abuse   . CHF (congestive heart failure) ([place])   . Cirrhosis ([place])   . Coronary artery disease 2009  . Drug abuse ([place])   . End stage renal disease on dialysis [facility]) NEPHROLOGIST-   DR [facility]  IN [place]   HEMODIALYSIS --   TUES/  THURS/  SAT  . Gastrointestinal bleed 06/13/2017   From chart...hx of multiple GI bleeds  . GERD (gastroesophageal reflux disease)   . Hyperlipidemia   . Hypertension   . PAD (peripheral artery disease) ([name])   . Renal insufficiency    Per pt, 32 oz fluid restriction per day  . S/P triple vessel bypass 06/09/2016   2009ish  . Suicidal ideation    & HOMICIDAL IDEATION --  06-16-2013   ADMITTED TO BEHAVIOR HEALTH    Patient Active Problem List   Diagnosis Date Noted  . ESRD (end stage renal disease) on dialysis ([place]) 07/28/2017  . Protein-calorie malnutrition, severe 06/14/2017  . Encounter for dialysis [facility])   . Palliative care by specialist   . Goals of care, counseling/discussion   . Malnutrition of moderate degree 06/05/2017  . Secondary esophageal varices without bleeding ([place])   . Stomach irritation   . Idiopathic esophageal varices without bleeding ([place])   . Alcoholic hepatitis with ascites 05/24/2017  . ESRD (end stage renal disease) ([place]) 04/28/2017  . Uremia 03/08/2017  . ESRD on  hemodialysis ([place]) 03/03/2017  . Weakness 02/28/2017  . Hypocalcemia 02/22/2017  . Shortness of breath 11/26/2016  . COPD (chronic obstructive pulmonary disease) ([place]) 10/30/2016  . COPD exacerbation ([place]) 10/29/2016  . Anemia   . Heme positive stool   . Ulceration of intestine   . Benign neoplasm of transverse colon   . Acute gastrointestinal hemorrhage   . Esophageal candidiasis ([place])   . Angiodysplasia of intestinal tract   . Acute respiratory failure with hypoxia ([place]) 07/03/2016  . GI bleeding 06/24/2016  . Rectal bleeding 06/14/2016  . Anemia of chronic disease 06/01/2016  . MRSA carrier 06/01/2016  . Chronic renal failure 05/23/2016  . Ischemic heart disease 05/23/2016  . Angiodysplasia of small intestine   . Melena   . Small bowel bleed not requiring more than 4 units of blood in 24 hours, ICU, or surgery   . Anemia due to chronic blood loss   . Abdominal pain 05/05/2016  . Acute posthemorrhagic anemia 04/17/2016  . Gastrointestinal bleed 04/17/2016  . History of esophagogastroduodenoscopy (EGD) 04/17/2016  . Elevated troponin 04/17/2016  . Alcohol abuse 04/17/2016  . Upper GI bleed 01/19/2016  . Blood in stool   . Angiodysplasia of stomach and duodenum with hemorrhage   . Gastritis   . Reflux esophagitis   . GI bleed 05/16/2015  . Acute GI bleeding   . Symptomatic anemia 04/30/2015  . HTN (hypertension) 04/06/2015  . GERD (gastroesophageal reflux disease) 04/06/2015  . HLD (hyperlipidemia) 04/06/2015  . Dyspnea  04/06/2015  . Cirrhosis of liver with ascites ([place]) 04/06/2015  . Ascites 04/06/2015  . GIB (gastrointestinal bleeding) 03/23/2015  . Homicidal ideation 06/19/2013  . Suicidal intent 06/19/2013  . Homicidal ideations 06/19/2013  . Hyperkalemia 06/16/2013  . Mandible fracture ([place]) 06/05/2013  . Fracture, mandible ([place]) 06/02/2013  . Coronary atherosclerosis of native coronary artery 06/02/2013  . ESRD on dialysis ([place]) 06/02/2013  . Mandible open  fracture ([place]) 06/02/2013    Past Surgical History:  Procedure Laterality Date  . A/V FISTULAGRAM Right 06/06/2017   Procedure: A/V FISTULAGRAM;  Surgeon: Katha Cabal, MD;  Location: [place] CV LAB;  Service: Cardiovascular;  Laterality: Right;  . A/V SHUNT INTERVENTION N/A 06/06/2017   Procedure: A/V SHUNT INTERVENTION;  Surgeon: Katha Cabal, MD;  Location: [place] CV LAB;  Service: Cardiovascular;  Laterality: N/A;  . AGILE CAPSULE N/A 06/19/2016   Procedure: AGILE CAPSULE;  Surgeon: [name], MD;  Location: [facility] ENDOSCOPY;  Service: Endoscopy;  Laterality: N/A;  . COLONOSCOPY WITH PROPOFOL N/A 06/18/2016   Procedure: COLONOSCOPY WITH PROPOFOL;  Surgeon: [name], MD;  Location: [facility] ENDOSCOPY;  Service: Endoscopy;  Laterality: N/A;  . COLONOSCOPY WITH PROPOFOL N/A 08/12/2016   Procedure: COLONOSCOPY WITH PROPOFOL;  Surgeon: [name], MD;  Location: [facility] ENDOSCOPY;  Service: Endoscopy;  Laterality: N/A;  . COLONOSCOPY WITH PROPOFOL N/A 05/05/2017   Procedure: COLONOSCOPY WITH PROPOFOL;  Surgeon: Manya Silvas, MD;  Location: [facility] ENDOSCOPY;  Service: Endoscopy;  Laterality: N/A;  . CORONARY ANGIOPLASTY  ?   PT UNABLE TO TELL IF  BEFORE OR AFTER  CABG  . CORONARY ARTERY BYPASS GRAFT  2008  ([place] , [place])   3 VESSEL  . DIALYSIS FISTULA CREATION  LAST SURGERY  APPOX  2008  . ENTEROSCOPY N/A 05/10/2016   Procedure: ENTEROSCOPY;  Surgeon: Jerene Bears, MD;  Location: [place];  Service: Gastroenterology;  Laterality: N/A;  . ENTEROSCOPY N/A 08/12/2016   Procedure: ENTEROSCOPY;  Surgeon: [name], MD;  Location: [facility] ENDOSCOPY;  Service: Endoscopy;  Laterality: N/A;  . ENTEROSCOPY Left 06/03/2017   Procedure: ENTEROSCOPY;  Surgeon: Virgel Manifold, MD;  Location: [facility] ENDOSCOPY;  Service: Endoscopy;  Laterality: Left;  Procedure date will ultimately depend on when patient is medically optimized before the procedure, pending hemodialysis and blood  transfusions etc. Will place on schedule and change depending on clinical status.   . ENTEROSCOPY N/A 06/05/2017   Procedure: ENTEROSCOPY;  Surgeon: Virgel Manifold, MD;  Location: [facility] ENDOSCOPY;  Service: Endoscopy;  Laterality: N/A;  . ENTEROSCOPY N/A 06/15/2017   Procedure: Push ENTEROSCOPY;  Surgeon: [name], MD;  Location: [facility] ENDOSCOPY;  Service: Endoscopy;  Laterality: N/A;  . ESOPHAGOGASTRODUODENOSCOPY N/A 05/07/2015   Procedure: ESOPHAGOGASTRODUODENOSCOPY (EGD);  Surgeon: Hulen Luster, MD;  Location: [facility] ENDOSCOPY;  Service: Endoscopy;  Laterality: N/A;  . ESOPHAGOGASTRODUODENOSCOPY (EGD) WITH PROPOFOL N/A 05/17/2015   Procedure: ESOPHAGOGASTRODUODENOSCOPY (EGD) WITH PROPOFOL;  Surgeon: [name], MD;  Location: [facility] ENDOSCOPY;  Service: Endoscopy;  Laterality: N/A;  . ESOPHAGOGASTRODUODENOSCOPY (EGD) WITH PROPOFOL N/A 01/20/2016   Procedure: ESOPHAGOGASTRODUODENOSCOPY (EGD) WITH PROPOFOL;  Surgeon: [name], MD;  Location: [facility] ENDOSCOPY;  Service: Endoscopy;  Laterality: N/A;  . ESOPHAGOGASTRODUODENOSCOPY (EGD) WITH PROPOFOL N/A 04/17/2016   Procedure: ESOPHAGOGASTRODUODENOSCOPY (EGD) WITH PROPOFOL;  Surgeon: Lin Landsman, MD;  Location: [facility] ENDOSCOPY;  Service: Gastroenterology;  Laterality: N/A;  . ESOPHAGOGASTRODUODENOSCOPY (EGD) WITH PROPOFOL  05/09/2016   Procedure: ESOPHAGOGASTRODUODENOSCOPY (EGD) WITH PROPOFOL;  Surgeon: Jerene Bears, MD;  [facility];  Service: Endoscopy;;  . ESOPHAGOGASTRODUODENOSCOPY (EGD) WITH PROPOFOL N/A 06/16/2016   Procedure: ESOPHAGOGASTRODUODENOSCOPY (EGD) WITH PROPOFOL;  Surgeon: [name], MD;  Location: [facility] ENDOSCOPY;  Service: Endoscopy;  Laterality: N/A;  . ESOPHAGOGASTRODUODENOSCOPY (EGD) WITH PROPOFOL N/A 05/05/2017   Procedure: ESOPHAGOGASTRODUODENOSCOPY (EGD) WITH PROPOFOL;  Surgeon: Manya Silvas, MD;  Location: [facility] ENDOSCOPY;  Service: Endoscopy;  Laterality: N/A;  . ESOPHAGOGASTRODUODENOSCOPY (EGD) WITH PROPOFOL  N/A 06/15/2017   Procedure: ESOPHAGOGASTRODUODENOSCOPY (EGD) WITH PROPOFOL;  Surgeon: [name], MD;  Location: [facility] ENDOSCOPY;  Service: Endoscopy;  Laterality: N/A;  . GIVENS CAPSULE STUDY N/A 05/07/2016   Procedure: GIVENS CAPSULE STUDY;  Surgeon: Doran Stabler, MD;  Location: [place];  Service: Endoscopy;  Laterality: N/A;  . MANDIBULAR HARDWARE REMOVAL N/A 07/29/2013   Procedure: REMOVAL OF ARCH BARS;  Surgeon: Theodoro Kos, DO;  Location: [place];  Service: Plastics;  Laterality: N/A;  . ORIF MANDIBULAR FRACTURE N/A 06/05/2013   Procedure: REPAIR OF MANDIBULAR FRACTURE x 2 with maxillo-mandibular fixation ;  Surgeon: Theodoro Kos, DO;  Location: [place];  Service: Plastics;  Laterality: N/A;  . PARACENTESIS    . PERIPHERAL ARTERIAL STENT GRAFT Left     Allergies Patient has no known allergies.  Social History Social History   Tobacco Use  . Smoking status: Current Every Day Smoker    Packs/day: 0.15    Years: 40.00    Pack years: 6.00    Types: Cigarettes  . Smokeless tobacco: Never Used  Substance Use Topics  . Alcohol use: No    Comment: pt reports quitting after learning about cirrhosis  . Drug use: No    Frequency: 7.0 times per week    Types: Marijuana, Cocaine   Review of Systems Constitutional: Negative for fever. Cardiovascular: Negative for chest pain. Respiratory: Negative for shortness of breath. Gastrointestinal: Negative for abdominal pain, vomiting and diarrhea. Musculoskeletal: Negative for back pain. Skin: Negative for rash. Neurological: Negative for headaches, focal weakness or numbness.  All systems negative/normal/unremarkable except as stated in the HPI  ____________________________________________   PHYSICAL EXAM:  VITAL SIGNS: ED Triage Vitals [10/20/17 1341]  Enc Vitals Group     BP 135/82     Pulse Rate 84     Resp 16     Temp (!) 97.5 F (36.4 C)     Temp Source Oral     SpO2 100 %     Weight 178 lb  12.7 oz (81.1 kg)     Height 6\' 3"  (1.905 m)     Head Circumference      Peak Flow      Pain Score 0     Pain Loc      Pain Edu?      Excl. in [place]?    Constitutional: Alert and oriented. Well appearing and in no distress. Eyes: Conjunctivae are normal. Normal extraocular movements. Cardiovascular: Normal rate, regular rhythm. No murmurs, rubs, or gallops. Respiratory: Normal respiratory effort without tachypnea nor retractions. Breath sounds are clear and equal bilaterally. No wheezes/rales/rhonchi. Gastrointestinal: Soft and nontender. Normal bowel sounds Musculoskeletal: Nontender with normal range of motion in extremities. No lower extremity tenderness nor edema. Neurologic:  Normal speech and language. No gross focal neurologic deficits are appreciated.  Skin:  Skin is warm, dry and intact. No rash noted. Psychiatric: Mood and affect are normal. Speech and behavior are normal.  ____________________________________________  ED COURSE:  As part of my medical decision making, I reviewed the following data  within the [place] History obtained from family if available, nursing notes, old chart and ekg, as well as notes from prior ED visits. Patient presented for reevaluation after dialysis, he appears medically stable and asymptomatic at this time.   Procedures ____________________________________________   LABS (pertinent positives/negatives)  Labs Reviewed  CBC - Abnormal; Notable for the following components:      Result Value   RBC 3.97 (*)    Hemoglobin 8.9 (*)    HCT 29.2 (*)    MCV 73.6 (*)    MCH 22.3 (*)    MCHC 30.3 (*)    RDW 19.4 (*)    All other components within normal limits  RENAL FUNCTION PANEL - Abnormal; Notable for the following components:   Glucose, Bld 153 (*)    BUN 47 (*)    Creatinine, Ser 10.20 (*)    Calcium 7.9 (*)    Phosphorus 5.6 (*)    Albumin 2.6 (*)    GFR calc non Af Amer 5 (*)    GFR calc Af Amer 6 (*)    All  other components within normal limits   ____________________________________________  DIFFERENTIAL DIAGNOSIS   End-stage renal disease on dialysis  FINAL ASSESSMENT AND PLAN  End-stage renal disease on dialysis   Plan: The patient had presented for evaluation after dialysis, he has no complaints and is cleared for discharge.   Laurence Aly, MD   Note: This note was generated in part or whole with voice recognition software. Voice recognition is usually quite accurate but there are transcription errors that can and very often do occur. I apologize for any typographical errors that were not detected and corrected.     Earleen Newport, MD 10/20/17 [phone]

## 2017-10-20 NOTE — ED Triage Notes (Signed)
Here for routine dialysis.

## 2017-10-20 NOTE — Procedures (Signed)
US guided paracentesis.  Removed 7.75 liters of yellow fluid.  Minimal blood loss and no immediate complication.

## 2017-10-20 NOTE — ED Provider Notes (Signed)
We were planning to see the patient after dialysis but he left AGAINST MEDICAL ADVICE prior to my evaluation   Earleen Newport, MD 10/20/17 [phone]

## 2017-10-20 NOTE — ED Notes (Signed)
Pt eloped from dialysis after treatment.  ED staff went to bring patient back to ED for reevaluation and discharge but patient was no where to be found.

## 2017-10-20 NOTE — ED Provider Notes (Signed)
Patient well-known to this facility presents to the ER for routine dialysis.  Denies any shortness of breath chest pain or other symptoms.  Options or concerns.  Patient ready for dialysis.   Merlyn Lot, MD 10/20/17 1353

## 2017-10-20 NOTE — Progress Notes (Signed)
Hd started  

## 2017-10-20 NOTE — Progress Notes (Signed)
Discontinued tx due to severe cramping in chest after eating AMA.

## 2017-10-20 NOTE — Progress Notes (Signed)
[facility]  ROUNDING NOTE   Subjective:   Seen and examined during HD Paracentesis earlier today - 7.75 L removed  Objective:  Vital signs in last 24 hours:  Temp:  [97.5 F (36.4 C)] 97.5 F (36.4 C) (08/09 1341) Pulse Rate:  [81-88] 84 (08/09 1341) Resp:  [16-18] 16 (08/09 1341) BP: (127-135)/(74-82) 135/82 (08/09 1341) SpO2:  [97 %-100 %] 100 % (08/09 1341) Weight:  [81.1 kg] 81.1 kg (08/09 1341)  Weight change:  Filed Weights   10/20/17 1341  Weight: 81.1 kg    Intake/Output: No intake/output data recorded.   Intake/Output this shift:  No intake/output data recorded.  Physical Exam: General: No acute distress  Head: Normocephalic, atraumatic. Moist oral mucosal membranes  Eyes: Anicteric  Neck: Supple, trachea midline  Lungs:  Clear to auscultation, normal effort  Heart: S1S2 no rubs, prominent systolic murmur  Abdomen:  + distended, ascites  Extremities: + peripheral edema.  Neurologic: nonfocal  Skin: No lesions  Access: R IJ permcath    Basic Metabolic Panel: No results for input(s): NA, K, CL, CO2, GLUCOSE, BUN, CREATININE, CALCIUM, MG, PHOS in the last 168 hours.  Liver Function Tests: No results for input(s): AST, ALT, ALKPHOS, BILITOT, PROT, ALBUMIN in the last 168 hours. No results for input(s): LIPASE, AMYLASE in the last 168 hours. No results for input(s): AMMONIA in the last 168 hours.  CBC: No results for input(s): WBC, NEUTROABS, HGB, HCT, MCV, PLT in the last 168 hours.  Cardiac Enzymes: No results for input(s): CKTOTAL, CKMB, CKMBINDEX, TROPONINI in the last 168 hours.  BNP: Invalid input(s): POCBNP  CBG: No results for input(s): GLUCAP in the last 168 hours.  Microbiology: Results for orders placed or performed during the hospital encounter of 07/12/17  Culture, blood (Routine X 2) w Reflex to ID Panel     Status: None   Collection Time: 07/12/17  4:43 PM  Result Value Ref Range Status   Specimen Description  BLOOD LINE CENTRAL  Final   Special Requests   Final    BOTTLES DRAWN AEROBIC AND ANAEROBIC Blood Culture adequate volume   Culture   Final    NO GROWTH 5 DAYS Performed at [facility], [address] [place], [place] [address]    Report Status 07/17/2017 FINAL  Final    Coagulation Studies: No results for input(s): LABPROT, INR in the last 72 hours.  Urinalysis: No results for input(s): COLORURINE, LABSPEC, PHURINE, GLUCOSEU, HGBUR, BILIRUBINUR, KETONESUR, PROTEINUR, UROBILINOGEN, NITRITE, LEUKOCYTESUR in the last 72 hours.  Invalid input(s): APPERANCEUR    Imaging: US Paracentesis  Result Date: 10/20/2017 INDICATION: 57 year old with recurrent large volume ascites. EXAM: ULTRASOUND GUIDED PARACENTESIS MEDICATIONS: None. COMPLICATIONS: None immediate. PROCEDURE: Informed written consent was obtained from the patient after a discussion of the risks, benefits and alternatives to treatment. A timeout was performed prior to the initiation of the procedure. Initial ultrasound scanning demonstrates a large amount of ascites within the right lower abdominal quadrant. The right lower abdomen was prepped and draped in the usual sterile fashion. 1% lidocaine was used for local anesthesia. Following this, a 6 Fr Safe-T-Centesis catheter was introduced. An ultrasound image was saved for documentation purposes. The paracentesis was performed. The catheter was removed and a dressing was applied. The patient tolerated the procedure well without immediate post procedural complication. FINDINGS: A total of approximately 7.75 L of yellow fluid was removed. IMPRESSION: Successful ultrasound-guided paracentesis yielding 7.75 liters of peritoneal fluid. Patient was scheduled to get albumin  at dialysis after the paracentesis. Electronically Signed   By: [name] M.D.   On: 10/20/2017 14:04     Medications:       Assessment/ Plan: 57 y.o. black male withend stage renal disease on  hemodialysis secondary to Alport's syndrome, ascites, hypertension, anemia of chronic kidney disease, coronary artery disease, peripheral vascular disease, hyperlipidemia, gastrointestinal AVMs, pulmonary hypertension  [facility]   1. Hyperkalemia:   - 2K bath on hemodialysis - Low Potassium diet  2.  End-stage renal disease:  Last hemodialysis treatment was Tuesday.  - Seen on hemodialysis today.    3. Ascites: status post large volume ultrasound guided paracentesis on 8/9 with 7.75 liters removed.   4. Anemia of chronic kidney disease:  - check CBC   LOS: 0 Zayn Selley 8/9/20192:12 PM

## 2017-10-20 NOTE — Progress Notes (Signed)
Patient unable to wait for transport back to ED.  Said he was walking and left dialysis unit.

## 2017-10-20 NOTE — Progress Notes (Signed)
Restarted tx with minimum uf dt severe cramping

## 2017-10-23 ENCOUNTER — Encounter: Payer: Self-pay | Admitting: Emergency Medicine

## 2017-10-23 ENCOUNTER — Emergency Department
Admission: EM | Admit: 2017-10-23 | Discharge: 2017-10-23 | Discharge status: Against Medical Advice | Payer: Medicare Other | Attending: Emergency Medicine | Admitting: Emergency Medicine

## 2017-10-23 ENCOUNTER — Other Ambulatory Visit: Payer: Self-pay

## 2017-10-23 DIAGNOSIS — I2510 Atherosclerotic heart disease of native coronary artery without angina pectoris: Secondary | ICD-10-CM | POA: Diagnosis not present

## 2017-10-23 DIAGNOSIS — N186 End stage renal disease: Secondary | ICD-10-CM | POA: Diagnosis not present

## 2017-10-23 DIAGNOSIS — Z992 Dependence on renal dialysis: Secondary | ICD-10-CM | POA: Insufficient documentation

## 2017-10-23 DIAGNOSIS — Z5189 Encounter for other specified aftercare: Secondary | ICD-10-CM | POA: Insufficient documentation

## 2017-10-23 DIAGNOSIS — J449 Chronic obstructive pulmonary disease, unspecified: Secondary | ICD-10-CM | POA: Insufficient documentation

## 2017-10-23 DIAGNOSIS — I509 Heart failure, unspecified: Secondary | ICD-10-CM | POA: Insufficient documentation

## 2017-10-23 DIAGNOSIS — Z5320 Procedure and treatment not carried out because of patient's decision for unspecified reasons: Secondary | ICD-10-CM | POA: Insufficient documentation

## 2017-10-23 DIAGNOSIS — Z79899 Other long term (current) drug therapy: Secondary | ICD-10-CM | POA: Insufficient documentation

## 2017-10-23 DIAGNOSIS — F17210 Nicotine dependence, cigarettes, uncomplicated: Secondary | ICD-10-CM | POA: Diagnosis not present

## 2017-10-23 DIAGNOSIS — I132 Hypertensive heart and chronic kidney disease with heart failure and with stage 5 chronic kidney disease, or end stage renal disease: Secondary | ICD-10-CM | POA: Insufficient documentation

## 2017-10-23 DIAGNOSIS — I120 Hypertensive chronic kidney disease with stage 5 chronic kidney disease or end stage renal disease: Secondary | ICD-10-CM | POA: Diagnosis not present

## 2017-10-23 MED ORDER — CHLORHEXIDINE GLUCONATE CLOTH 2 % EX PADS: 6.0000 | MEDICATED_PAD | Freq: Every day | CUTANEOUS | Status: DC

## 2017-10-23 NOTE — ED Triage Notes (Signed)
Here for dialysis

## 2017-10-23 NOTE — ED Notes (Signed)
Patient denies complaints. Seen by MD and cleared for dialysis. Dialysis called and states they will not take report until 2p and will return our call.

## 2017-10-23 NOTE — ED Notes (Signed)
Floor calls for orders. Patient is not in [place] and is not in [facility]. Fraser Din appears to have eloped.

## 2017-10-23 NOTE — ED Provider Notes (Signed)
[facility] Emergency Department Provider Note   ____________________________________________   First MD Initiated Contact with Patient 10/23/17 1155     (approximate)  I have reviewed the triage vital signs and the nursing notes.   HISTORY  Chief Complaint dialysis    HPI Stanley [name] is a 56 y.o. male reports no complaints.  Here for dialysis.  Reports no concerns.  Denies chest pain or trouble breathing.  No leg swelling.  No fevers, reports she is been doing well but just needs his regular dialysis treatment.   Past Medical History:  Diagnosis Date  . Alcohol abuse   . CHF (congestive heart failure) ([place])   . Cirrhosis ([place])   . Coronary artery disease 2009  . Drug abuse ([place])   . End stage renal disease on dialysis [facility]) NEPHROLOGIST-   DR [facility]  IN [place]   HEMODIALYSIS --   TUES/  THURS/  SAT  . Gastrointestinal bleed 06/13/2017   From chart...hx of multiple GI bleeds  . GERD (gastroesophageal reflux disease)   . Hyperlipidemia   . Hypertension   . PAD (peripheral artery disease) ([place])   . Renal insufficiency    Per pt, 32 oz fluid restriction per day  . S/P triple vessel bypass 06/09/2016   2009ish  . Suicidal ideation    & HOMICIDAL IDEATION --  06-16-2013   ADMITTED TO BEHAVIOR HEALTH    Patient Active Problem List   Diagnosis Date Noted  . ESRD (end stage renal disease) on dialysis ([place]) 07/28/2017  . Protein-calorie malnutrition, severe 06/14/2017  . Encounter for dialysis [facility])   . Palliative care by specialist   . Goals of care, counseling/discussion   . Malnutrition of moderate degree 06/05/2017  . Secondary esophageal varices without bleeding ([place])   . Stomach irritation   . Idiopathic esophageal varices without bleeding ([place])   . Alcoholic hepatitis with ascites 05/24/2017  . ESRD (end stage renal disease) ([place]) 04/28/2017  . Uremia 03/08/2017  . ESRD on hemodialysis ([place]) 03/03/2017  . Weakness  02/28/2017  . Hypocalcemia 02/22/2017  . Shortness of breath 11/26/2016  . COPD (chronic obstructive pulmonary disease) ([place]) 10/30/2016  . COPD exacerbation ([place]) 10/29/2016  . Anemia   . Heme positive stool   . Ulceration of intestine   . Benign neoplasm of transverse colon   . Acute gastrointestinal hemorrhage   . Esophageal candidiasis ([place])   . Angiodysplasia of intestinal tract   . Acute respiratory failure with hypoxia ([place]) 07/03/2016  . GI bleeding 06/24/2016  . Rectal bleeding 06/14/2016  . Anemia of chronic disease 06/01/2016  . MRSA carrier 06/01/2016  . Chronic renal failure 05/23/2016  . Ischemic heart disease 05/23/2016  . Angiodysplasia of small intestine   . Melena   . Small bowel bleed not requiring more than 4 units of blood in 24 hours, ICU, or surgery   . Anemia due to chronic blood loss   . Abdominal pain 05/05/2016  . Acute posthemorrhagic anemia 04/17/2016  . Gastrointestinal bleed 04/17/2016  . History of esophagogastroduodenoscopy (EGD) 04/17/2016  . Elevated troponin 04/17/2016  . Alcohol abuse 04/17/2016  . Upper GI bleed 01/19/2016  . Blood in stool   . Angiodysplasia of stomach and duodenum with hemorrhage   . Gastritis   . Reflux esophagitis   . GI bleed 05/16/2015  . Acute GI bleeding   . Symptomatic anemia 04/30/2015  . HTN (hypertension) 04/06/2015  . GERD (gastroesophageal reflux disease) 04/06/2015  . HLD (  hyperlipidemia) 04/06/2015  . Dyspnea 04/06/2015  . Cirrhosis of liver with ascites ([place]) 04/06/2015  . Ascites 04/06/2015  . GIB (gastrointestinal bleeding) 03/23/2015  . Homicidal ideation 06/19/2013  . Suicidal intent 06/19/2013  . Homicidal ideations 06/19/2013  . Hyperkalemia 06/16/2013  . Mandible fracture ([place]) 06/05/2013  . Fracture, mandible ([place]) 06/02/2013  . Coronary atherosclerosis of native coronary artery 06/02/2013  . ESRD on dialysis ([place]) 06/02/2013  . Mandible open fracture ([place]) 06/02/2013    Past  Surgical History:  Procedure Laterality Date  . A/V FISTULAGRAM Right 06/06/2017   Procedure: A/V FISTULAGRAM;  Surgeon: Katha Cabal, MD;  Location: [place] CV LAB;  Service: Cardiovascular;  Laterality: Right;  . A/V SHUNT INTERVENTION N/A 06/06/2017   Procedure: A/V SHUNT INTERVENTION;  Surgeon: Katha Cabal, MD;  Location: [place] [facility];  Service: Cardiovascular;  Laterality: N/A;  . AGILE CAPSULE N/A 06/19/2016   Procedure: AGILE CAPSULE;  Surgeon: [name], MD;  Location: [facility] ENDOSCOPY;  Service: Endoscopy;  Laterality: N/A;  . COLONOSCOPY WITH PROPOFOL N/A 06/18/2016   Procedure: COLONOSCOPY WITH PROPOFOL;  Surgeon: [name], MD;  Location: [facility] ENDOSCOPY;  Service: Endoscopy;  Laterality: N/A;  . COLONOSCOPY WITH PROPOFOL N/A 08/12/2016   Procedure: COLONOSCOPY WITH PROPOFOL;  Surgeon: [name], MD;  Location: [facility] ENDOSCOPY;  Service: Endoscopy;  Laterality: N/A;  . COLONOSCOPY WITH PROPOFOL N/A 05/05/2017   Procedure: COLONOSCOPY WITH PROPOFOL;  Surgeon: Manya Silvas, MD;  Location: [facility] ENDOSCOPY;  Service: Endoscopy;  Laterality: N/A;  . CORONARY ANGIOPLASTY  ?   PT UNABLE TO TELL IF  BEFORE OR AFTER  CABG  . CORONARY ARTERY BYPASS GRAFT  2008  ([place]   3 VESSEL  . DIALYSIS FISTULA CREATION  LAST SURGERY  APPOX  2008  . ENTEROSCOPY N/A 05/10/2016   Procedure: ENTEROSCOPY;  Surgeon: Jerene Bears, MD;  Location: [place];  Service: Gastroenterology;  Laterality: N/A;  . ENTEROSCOPY N/A 08/12/2016   Procedure: ENTEROSCOPY;  Surgeon: [name], MD;  Location: [facility] ENDOSCOPY;  Service: Endoscopy;  Laterality: N/A;  . ENTEROSCOPY Left 06/03/2017   Procedure: ENTEROSCOPY;  Surgeon: Virgel Manifold, MD;  Location: [facility] ENDOSCOPY;  Service: Endoscopy;  Laterality: Left;  Procedure date will ultimately depend on when patient is medically optimized before the procedure, pending hemodialysis and blood transfusions etc. Will place on schedule and  change depending on clinical status.   . ENTEROSCOPY N/A 06/05/2017   Procedure: ENTEROSCOPY;  Surgeon: Virgel Manifold, MD;  Location: [facility] ENDOSCOPY;  Service: Endoscopy;  Laterality: N/A;  . ENTEROSCOPY N/A 06/15/2017   Procedure: Push ENTEROSCOPY;  Surgeon: [name], MD;  Location: [facility] ENDOSCOPY;  Service: Endoscopy;  Laterality: N/A;  . ESOPHAGOGASTRODUODENOSCOPY N/A 05/07/2015   Procedure: ESOPHAGOGASTRODUODENOSCOPY (EGD);  Surgeon: Hulen Luster, MD;  Location: [facility] ENDOSCOPY;  Service: Endoscopy;  Laterality: N/A;  . ESOPHAGOGASTRODUODENOSCOPY (EGD) WITH PROPOFOL N/A 05/17/2015   Procedure: ESOPHAGOGASTRODUODENOSCOPY (EGD) WITH PROPOFOL;  Surgeon: [name], MD;  Location: [facility] ENDOSCOPY;  Service: Endoscopy;  Laterality: N/A;  . ESOPHAGOGASTRODUODENOSCOPY (EGD) WITH PROPOFOL N/A 01/20/2016   Procedure: ESOPHAGOGASTRODUODENOSCOPY (EGD) WITH PROPOFOL;  Surgeon: [name], MD;  Location: [facility] ENDOSCOPY;  Service: Endoscopy;  Laterality: N/A;  . ESOPHAGOGASTRODUODENOSCOPY (EGD) WITH PROPOFOL N/A 04/17/2016   Procedure: ESOPHAGOGASTRODUODENOSCOPY (EGD) WITH PROPOFOL;  Surgeon: Lin Landsman, MD;  Location: [facility] ENDOSCOPY;  Service: Gastroenterology;  Laterality: N/A;  . ESOPHAGOGASTRODUODENOSCOPY (EGD) WITH PROPOFOL  05/09/2016   Procedure: ESOPHAGOGASTRODUODENOSCOPY (EGD) WITH PROPOFOL;  Surgeon: Ulice Dash  Everitt Amber, MD;  Location: [place];  Service: Endoscopy;;  . ESOPHAGOGASTRODUODENOSCOPY (EGD) WITH PROPOFOL N/A 06/16/2016   Procedure: ESOPHAGOGASTRODUODENOSCOPY (EGD) WITH PROPOFOL;  Surgeon: [name], MD;  Location: [facility] ENDOSCOPY;  Service: Endoscopy;  Laterality: N/A;  . ESOPHAGOGASTRODUODENOSCOPY (EGD) WITH PROPOFOL N/A 05/05/2017   Procedure: ESOPHAGOGASTRODUODENOSCOPY (EGD) WITH PROPOFOL;  Surgeon: Manya Silvas, MD;  Location: [facility] ENDOSCOPY;  Service: Endoscopy;  Laterality: N/A;  . ESOPHAGOGASTRODUODENOSCOPY (EGD) WITH PROPOFOL N/A 06/15/2017   Procedure:  ESOPHAGOGASTRODUODENOSCOPY (EGD) WITH PROPOFOL;  Surgeon: [name], MD;  Location: [facility] ENDOSCOPY;  Service: Endoscopy;  Laterality: N/A;  . GIVENS CAPSULE STUDY N/A 05/07/2016   Procedure: GIVENS CAPSULE STUDY;  Surgeon: Doran Stabler, MD;  Location: [place];  Service: Endoscopy;  Laterality: N/A;  . MANDIBULAR HARDWARE REMOVAL N/A 07/29/2013   Procedure: REMOVAL OF ARCH BARS;  Surgeon: Theodoro Kos, DO;  Location: [place];  Service: Plastics;  Laterality: N/A;  . ORIF MANDIBULAR FRACTURE N/A 06/05/2013   Procedure: REPAIR OF MANDIBULAR FRACTURE x 2 with maxillo-mandibular fixation ;  Surgeon: Theodoro Kos, DO;  Location: [place];  Service: Plastics;  Laterality: N/A;  . PARACENTESIS    . PERIPHERAL ARTERIAL STENT GRAFT Left     Prior to Admission medications   Medication Sig Start Date End Date Taking? Authorizing Provider  albuterol (PROVENTIL HFA;VENTOLIN HFA) 108 (90 Base) MCG/ACT inhaler Inhale 2 puffs into the lungs every 6 (six) hours as needed for wheezing or shortness of breath. 04/21/17   Lisa Roca, MD  alum & mag hydroxide-simeth (MAALOX/MYLANTA) 200-200-20 MG/5ML suspension Take 15 mLs every 4 (four) hours as needed by mouth for indigestion or heartburn. Patient not taking: Reported on 04/19/2017 01/25/17   Demetrios Loll, MD  atorvastatin (LIPITOR) 40 MG tablet Take 1 tablet (40 mg total) by mouth daily. Patient not taking: Reported on 10/20/2017 11/16/16   Vaughan Basta, MD  B Complex-C-Zn-Folic Acid (DIALYVITE/ZINC) TABS Take 1 tablet by mouth daily. 09/08/17   [provider]  budesonide-formoterol (SYMBICORT) 160-4.5 MCG/ACT inhaler Inhale 2 puffs into the lungs daily. 11/16/16   Vaughan Basta, MD  calcium acetate (PHOSLO) 667 MG capsule Take 2,001 mg by mouth 3 (three) times daily.    [provider]  doxycycline (VIBRA-TABS) 100 MG tablet Take 1 tablet (100 mg total) by mouth 2 (two) times daily. Patient not taking:  Reported on 08/28/2017 07/10/17   Harvest Dark, MD  ferrous sulfate 325 (65 FE) MG tablet Take 1 tablet by mouth 3 (three) times daily. 01/21/17   [provider]  furosemide (LASIX) 80 MG tablet Take 1 tablet (80 mg total) daily by mouth. Patient not taking: Reported on 10/20/2017 01/25/17   Demetrios Loll, MD  gabapentin (NEURONTIN) 300 MG capsule Take 300 mg by mouth 2 (two) times daily.  08/16/16   [provider]  ipratropium-albuterol (DUONEB) 0.5-2.5 (3) MG/3ML SOLN Take 3 mLs by nebulization every 6 (six) hours as needed. 11/28/16   Loletha Grayer, MD  labetalol (NORMODYNE) 100 MG tablet Take 100 mg by mouth daily. 03/24/17   [provider]  Multiple Vitamins-Minerals-FA (DIALYVITE SUPREME D) 3 MG TABS Take 1 tablet (3 mg total) by mouth daily. Patient not taking: Reported on 10/20/2017 11/28/16   Loletha Grayer, MD  nitroGLYCERIN (NITROSTAT) 0.4 MG SL tablet Place 1 tablet (0.4 mg total) under the tongue every 5 (five) minutes as needed. 04/12/16   Wende Bushy, MD  omeprazole (PRILOSEC) 40 MG capsule Take 40 mg by mouth daily.  09/08/17   [provider]  pantoprazole (PROTONIX) 40 MG tablet Take 1 tablet (40 mg total) by mouth daily. Patient not taking: Reported on 10/20/2017 11/28/16   Loletha Grayer, MD  Spacer/Aero Chamber Mouthpiece MISC 1 Units by Does not apply route every 4 (four) hours as needed (wheezing). 02/19/17   Darel Hong, MD  tiotropium (SPIRIVA HANDIHALER) 18 MCG inhalation capsule Place 1 capsule (18 mcg total) into inhaler and inhale daily. 11/16/16 11/16/17  Vaughan Basta, MD    Allergies Patient has no known allergies.  Family History  Problem Relation Age of Onset  . Colon cancer Mother   . Cancer Father   . Cancer Sister   . Kidney disease Brother     Social History Social History   Tobacco Use  . Smoking status: Current Every Day Smoker    Packs/day: 0.15    Years: 40.00    Pack years: 6.00    Types:  Cigarettes  . Smokeless tobacco: Never Used  Substance Use Topics  . Alcohol use: No    Comment: pt reports quitting after learning about cirrhosis  . Drug use: No    Frequency: 7.0 times per week    Types: Marijuana, Cocaine    Review of Systems Constitutional: No fever/chills Cardiovascular: Denies chest pain. Respiratory: Denies shortness of breath. Gastrointestinal: No abdominal pain.  Well. Musculoskeletal: No leg swelling. Neurological: Negative for weakness.    ____________________________________________   PHYSICAL EXAM:  VITAL SIGNS: ED Triage Vitals  Enc Vitals Group     BP 10/23/17 1137 137/81     Pulse Rate 10/23/17 1137 79     Resp 10/23/17 1137 20     Temp 10/23/17 1137 97.8 F (36.6 C)     Temp Source 10/23/17 1137 Oral     SpO2 10/23/17 1137 99 %     Weight --      Height 10/23/17 1138 6\' 3"  (1.905 m)     Head Circumference --      Peak Flow --      Pain Score 10/23/17 1138 0     Pain Loc --      Pain Edu? --      Excl. in [place]? --     Constitutional: Alert and oriented. Well appearing and in no acute distress.  Seated in recliner with his day bag. Eyes: Conjunctivae are normal. Head: Atraumatic. Nose: No congestion/rhinnorhea. Mouth/Throat: Mucous membranes are moist. Neck: No stridor.   Cardiovascular: Normal rate, regular rhythm. Grossly normal heart sounds.  Good peripheral circulation. Respiratory: Normal respiratory effort.  No retractions. Lungs CTAB. Musculoskeletal: No lower extremity tenderness nor edema. Neurologic:  Normal speech and language. No gross focal neurologic deficits are appreciated.  Skin:  Skin is warm, dry and intact. No rash noted. Psychiatric: Mood and affect are normal.  Calm and pleasant.  ____________________________________________   LABS (all labs ordered are listed, but only abnormal results are displayed)  Labs Reviewed - No data to  display ____________________________________________  EKG   ____________________________________________  RADIOLOGY   ____________________________________________   PROCEDURES  Procedure(s) performed: None  Procedures  Critical Care performed: No  ____________________________________________   INITIAL IMPRESSION / ASSESSMENT AND PLAN / ED COURSE  Pertinent labs & imaging results that were available during my care of the patient were reviewed by me and considered in my medical decision making (see chart for details).  Patient normal vital signs, resting comfortably.  Continues to have this need to return to the ER for his dialysis  sessions.  Presently appears stable for evaluation by nephrology and his dialysis session today. He understands and is waiting pleasantly for his dialysis session.       ____________________________________________   FINAL CLINICAL IMPRESSION(S) / ED DIAGNOSES  Final diagnoses:  ESRD (end stage renal disease) ([place])      NEW MEDICATIONS STARTED DURING THIS VISIT:  Discharge Medication List as of 10/23/2017  1:46 PM       Note:  This document was prepared using Dragon voice recognition software and may include unintentional dictation errors.     Delman Kitten, MD 10/24/17 [phone]

## 2017-10-23 NOTE — ED Notes (Signed)
Patient noted not to be in subwait.

## 2017-10-25 ENCOUNTER — Other Ambulatory Visit: Payer: Self-pay

## 2017-10-25 ENCOUNTER — Non-Acute Institutional Stay
Admission: EM | Admit: 2017-10-25 | Discharge: 2017-10-27 | Disposition: A | Discharge status: Home / Self Care | Payer: Medicare Other | Source: Home / Self Care | Attending: Emergency Medicine | Admitting: Emergency Medicine

## 2017-10-25 ENCOUNTER — Encounter: Payer: Self-pay | Admitting: Emergency Medicine

## 2017-10-25 DIAGNOSIS — F17210 Nicotine dependence, cigarettes, uncomplicated: Secondary | ICD-10-CM | POA: Insufficient documentation

## 2017-10-25 DIAGNOSIS — Z992 Dependence on renal dialysis: Secondary | ICD-10-CM | POA: Insufficient documentation

## 2017-10-25 DIAGNOSIS — Z951 Presence of aortocoronary bypass graft: Secondary | ICD-10-CM

## 2017-10-25 DIAGNOSIS — K7460 Unspecified cirrhosis of liver: Secondary | ICD-10-CM | POA: Insufficient documentation

## 2017-10-25 DIAGNOSIS — N186 End stage renal disease: Secondary | ICD-10-CM | POA: Diagnosis not present

## 2017-10-25 DIAGNOSIS — I132 Hypertensive heart and chronic kidney disease with heart failure and with stage 5 chronic kidney disease, or end stage renal disease: Secondary | ICD-10-CM | POA: Insufficient documentation

## 2017-10-25 DIAGNOSIS — R0602 Shortness of breath: Secondary | ICD-10-CM | POA: Diagnosis not present

## 2017-10-25 DIAGNOSIS — E875 Hyperkalemia: Secondary | ICD-10-CM

## 2017-10-25 DIAGNOSIS — Z79899 Other long term (current) drug therapy: Secondary | ICD-10-CM | POA: Insufficient documentation

## 2017-10-25 DIAGNOSIS — J811 Chronic pulmonary edema: Secondary | ICD-10-CM | POA: Diagnosis not present

## 2017-10-25 DIAGNOSIS — Z7951 Long term (current) use of inhaled steroids: Secondary | ICD-10-CM | POA: Insufficient documentation

## 2017-10-25 DIAGNOSIS — Z95820 Peripheral vascular angioplasty status with implants and grafts: Secondary | ICD-10-CM | POA: Insufficient documentation

## 2017-10-25 DIAGNOSIS — J449 Chronic obstructive pulmonary disease, unspecified: Secondary | ICD-10-CM | POA: Insufficient documentation

## 2017-10-25 DIAGNOSIS — I129 Hypertensive chronic kidney disease with stage 1 through stage 4 chronic kidney disease, or unspecified chronic kidney disease: Secondary | ICD-10-CM | POA: Diagnosis not present

## 2017-10-25 DIAGNOSIS — N189 Chronic kidney disease, unspecified: Secondary | ICD-10-CM | POA: Diagnosis not present

## 2017-10-25 DIAGNOSIS — K7031 Alcoholic cirrhosis of liver with ascites: Secondary | ICD-10-CM | POA: Diagnosis not present

## 2017-10-25 DIAGNOSIS — R188 Other ascites: Secondary | ICD-10-CM | POA: Insufficient documentation

## 2017-10-25 DIAGNOSIS — I2510 Atherosclerotic heart disease of native coronary artery without angina pectoris: Secondary | ICD-10-CM | POA: Insufficient documentation

## 2017-10-25 DIAGNOSIS — K210 Gastro-esophageal reflux disease with esophagitis: Secondary | ICD-10-CM | POA: Insufficient documentation

## 2017-10-25 DIAGNOSIS — I509 Heart failure, unspecified: Secondary | ICD-10-CM | POA: Insufficient documentation

## 2017-10-25 DIAGNOSIS — I739 Peripheral vascular disease, unspecified: Secondary | ICD-10-CM

## 2017-10-25 DIAGNOSIS — D631 Anemia in chronic kidney disease: Secondary | ICD-10-CM | POA: Insufficient documentation

## 2017-10-25 DIAGNOSIS — E8770 Fluid overload, unspecified: Secondary | ICD-10-CM | POA: Diagnosis not present

## 2017-10-25 LAB — RENAL FUNCTION PANEL
ALBUMIN: 2.8 g/dL — AB (ref 3.5–5.0)
Anion gap: 15 (ref 5–15)
BUN: 53 mg/dL — AB (ref 6–20)
CO2: 30 mmol/L (ref 22–32)
Calcium: 8 mg/dL — ABNORMAL LOW (ref 8.9–10.3)
Chloride: 97 mmol/L — ABNORMAL LOW (ref 98–111)
Creatinine, Ser: 13.06 mg/dL — ABNORMAL HIGH (ref 0.61–1.24)
GFR calc Af Amer: 4 mL/min — ABNORMAL LOW (ref >60)
GFR, EST NON AFRICAN AMERICAN: 4 mL/min — AB (ref >60)
GLUCOSE: 91 mg/dL (ref 70–99)
PHOSPHORUS: 7.5 mg/dL — AB (ref 2.5–4.6)
POTASSIUM: 6.2 mmol/L — AB (ref 3.5–5.1)
Sodium: 142 mmol/L (ref 135–145)

## 2017-10-25 LAB — BASIC METABOLIC PANEL
Anion gap: 14 (ref 5–15)
BUN: 53 mg/dL — AB (ref 6–20)
CALCIUM: 8.1 mg/dL — AB (ref 8.9–10.3)
CO2: 30 mmol/L (ref 22–32)
CREATININE: 13.32 mg/dL — AB (ref 0.61–1.24)
Chloride: 99 mmol/L (ref 98–111)
GFR calc Af Amer: 4 mL/min — ABNORMAL LOW (ref >60)
GFR, EST NON AFRICAN AMERICAN: 4 mL/min — AB (ref >60)
Glucose, Bld: 115 mg/dL — ABNORMAL HIGH (ref 70–99)
Potassium: 6.3 mmol/L (ref 3.5–5.1)
SODIUM: 143 mmol/L (ref 135–145)

## 2017-10-25 LAB — CBC
HCT: 30.4 % — ABNORMAL LOW (ref 40.0–52.0)
Hemoglobin: 9.3 g/dL — ABNORMAL LOW (ref 13.0–18.0)
MCH: 22.7 pg — ABNORMAL LOW (ref 26.0–34.0)
MCHC: 30.4 g/dL — ABNORMAL LOW (ref 32.0–36.0)
MCV: 74.5 fL — ABNORMAL LOW (ref 80.0–100.0)
PLATELETS: 356 10*3/uL (ref 150–440)
RBC: 4.08 MIL/uL — ABNORMAL LOW (ref 4.40–5.90)
RDW: 20.4 % — AB (ref 11.5–14.5)
WBC: 9.3 10*3/uL (ref 3.8–10.6)

## 2017-10-25 MED ORDER — CHLORHEXIDINE GLUCONATE CLOTH 2 % EX PADS
6.0000 | MEDICATED_PAD | Freq: Every day | CUTANEOUS | Status: DC
  Filled 2017-10-25 (×2): qty 6

## 2017-10-25 MED ORDER — DIPHENHYDRAMINE HCL 50 MG/ML IJ SOLN
25.0000 mg | Freq: Once | INTRAMUSCULAR | Status: AC
  Administered 2017-10-25: 25 mg via INTRAVENOUS

## 2017-10-25 MED ORDER — HEPARIN SODIUM (PORCINE) 1000 UNIT/ML DIALYSIS: 50.0000 [IU]/kg | INTRAMUSCULAR | Status: DC | PRN

## 2017-10-25 MED ORDER — HEPARIN SODIUM (PORCINE) 1000 UNIT/ML DIALYSIS: 20.0000 [IU]/kg | INTRAMUSCULAR | Status: DC | PRN

## 2017-10-25 NOTE — Progress Notes (Signed)
Post HD assessment    10/25/17 1752  Neurological  Level of Consciousness Alert  Orientation Level Oriented X4  Respiratory  Respiratory Pattern Regular;Unlabored  Chest Assessment Chest expansion symmetrical  Cough Non-productive  Cardiac  ECG Monitor Yes  Vascular  R Radial Pulse +2  L Radial Pulse +2  Edema Generalized  Integumentary  Integumentary (WDL) X  Skin Color Appropriate for ethnicity  Musculoskeletal  Musculoskeletal (WDL) X  Generalized Weakness Yes  Assistive Device Wheelchair  GU Assessment  Genitourinary (WDL) X  Genitourinary Symptoms  (HD)  Psychosocial  Psychosocial (WDL) WDL

## 2017-10-25 NOTE — Progress Notes (Signed)
HD tx start    10/25/17 1345  Vital Signs  Pulse Rate 84  Pulse Rate Source Monitor  Resp (!) 22  BP (!) 116/94  BP Location Left Arm  BP Method Automatic  Patient Position (if appropriate) Lying  Oxygen Therapy  SpO2 98 %  O2 Device Room Air  During Hemodialysis Assessment  Blood Flow Rate (mL/min) 400 mL/min  Arterial Pressure (mmHg) -120 mmHg  Venous Pressure (mmHg) 130 mmHg  Transmembrane Pressure (mmHg) 80 mmHg  Ultrafiltration Rate (mL/min) 1390 mL/min  Dialysate Flow Rate (mL/min) 800 ml/min  Conductivity: Machine  13.9  HD Safety Checks Performed Yes  Dialysis Fluid Bolus Normal Saline  Bolus Amount (mL) 250 mL  Intra-Hemodialysis Comments Tx initiated  Hemodialysis Catheter Right Internal jugular Double-lumen  No Placement Date or Time found.   Placed prior to admission: Yes  Orientation: Right  Access Location: Internal jugular  Hemodialysis Catheter Type: Double-lumen  Blue Lumen Status Infusing  Red Lumen Status Infusing

## 2017-10-25 NOTE — ED Provider Notes (Signed)
[facility] Emergency Department Provider Note  ____________________________________________  Time seen: Approximately 2:40 PM  I have reviewed the triage vital signs and the nursing notes.   HISTORY  Chief Complaint Shortness of Breath    HPI Stanley [name] is a 57 y.o. male with a history of end-stage renal disease on hemodialysis, CHF, CAD, drug and alcohol abuse who comes to the ED today for routine dialysis.  Does report new symptoms over the last 24 hours including occasional dizziness with standing and rapidly fatiguing bilateral lower extremities when he walks.  He attributes this to peripheral artery disease.   Denies chest pain or shortness of breath.  No syncope.  He notes that he did miss his dialysis session 2 days ago on Monday.     Past Medical History:  Diagnosis Date  . Alcohol abuse   . CHF (congestive heart failure) ([place])   . Cirrhosis ([place])   . Coronary artery disease 2009  . Drug abuse ([place])   . End stage renal disease on dialysis [facility]) NEPHROLOGIST-   DR [facility]  IN [place]   HEMODIALYSIS --   TUES/  THURS/  SAT  . Gastrointestinal bleed 06/13/2017   From chart...hx of multiple GI bleeds  . GERD (gastroesophageal reflux disease)   . Hyperlipidemia   . Hypertension   . PAD (peripheral artery disease) ([place])   . Renal insufficiency    Per pt, 32 oz fluid restriction per day  . S/P triple vessel bypass 06/09/2016   2009ish  . Suicidal ideation    & HOMICIDAL IDEATION --  06-16-2013   ADMITTED TO BEHAVIOR HEALTH     Patient Active Problem List   Diagnosis Date Noted  . ESRD (end stage renal disease) on dialysis ([place]) 07/28/2017  . Protein-calorie malnutrition, severe 06/14/2017  . Encounter for dialysis [facility])   . Palliative care by specialist   . Goals of care, counseling/discussion   . Malnutrition of moderate degree 06/05/2017  . Secondary esophageal varices without bleeding ([place])   . Stomach irritation   .  Idiopathic esophageal varices without bleeding ([place])   . Alcoholic hepatitis with ascites 05/24/2017  . ESRD (end stage renal disease) ([place]) 04/28/2017  . Uremia 03/08/2017  . ESRD on hemodialysis ([place]) 03/03/2017  . Weakness 02/28/2017  . Hypocalcemia 02/22/2017  . Shortness of breath 11/26/2016  . COPD (chronic obstructive pulmonary disease) ([place]) 10/30/2016  . COPD exacerbation ([place]) 10/29/2016  . Anemia   . Heme positive stool   . Ulceration of intestine   . Benign neoplasm of transverse colon   . Acute gastrointestinal hemorrhage   . Esophageal candidiasis ([place])   . Angiodysplasia of intestinal tract   . Acute respiratory failure with hypoxia ([place]) 07/03/2016  . GI bleeding 06/24/2016  . Rectal bleeding 06/14/2016  . Anemia of chronic disease 06/01/2016  . MRSA carrier 06/01/2016  . Chronic renal failure 05/23/2016  . Ischemic heart disease 05/23/2016  . Angiodysplasia of small intestine   . Melena   . Small bowel bleed not requiring more than 4 units of blood in 24 hours, ICU, or surgery   . Anemia due to chronic blood loss   . Abdominal pain 05/05/2016  . Acute posthemorrhagic anemia 04/17/2016  . Gastrointestinal bleed 04/17/2016  . History of esophagogastroduodenoscopy (EGD) 04/17/2016  . Elevated troponin 04/17/2016  . Alcohol abuse 04/17/2016  . Upper GI bleed 01/19/2016  . Blood in stool   . Angiodysplasia of stomach and duodenum with hemorrhage   .  Gastritis   . Reflux esophagitis   . GI bleed 05/16/2015  . Acute GI bleeding   . Symptomatic anemia 04/30/2015  . HTN (hypertension) 04/06/2015  . GERD (gastroesophageal reflux disease) 04/06/2015  . HLD (hyperlipidemia) 04/06/2015  . Dyspnea 04/06/2015  . Cirrhosis of liver with ascites ([place]) 04/06/2015  . Ascites 04/06/2015  . GIB (gastrointestinal bleeding) 03/23/2015  . Homicidal ideation 06/19/2013  . Suicidal intent 06/19/2013  . Homicidal ideations 06/19/2013  . Hyperkalemia 06/16/2013  . Mandible  fracture ([place]) 06/05/2013  . Fracture, mandible ([place]) 06/02/2013  . Coronary atherosclerosis of native coronary artery 06/02/2013  . ESRD on dialysis ([place]) 06/02/2013  . Mandible open fracture ([place]) 06/02/2013     Past Surgical History:  Procedure Laterality Date  . A/V FISTULAGRAM Right 06/06/2017   Procedure: A/V FISTULAGRAM;  Surgeon: Katha Cabal, MD;  Location: [place] CV LAB;  Service: Cardiovascular;  Laterality: Right;  . A/V SHUNT INTERVENTION N/A 06/06/2017   Procedure: A/V SHUNT INTERVENTION;  Surgeon: Katha Cabal, MD;  Location: [place] CV LAB;  Service: Cardiovascular;  Laterality: N/A;  . AGILE CAPSULE N/A 06/19/2016   Procedure: AGILE CAPSULE;  Surgeon: [name], MD;  Location: [facility] ENDOSCOPY;  Service: Endoscopy;  Laterality: N/A;  . COLONOSCOPY WITH PROPOFOL N/A 06/18/2016   Procedure: COLONOSCOPY WITH PROPOFOL;  Surgeon: [name], MD;  Location: [facility] ENDOSCOPY;  Service: Endoscopy;  Laterality: N/A;  . COLONOSCOPY WITH PROPOFOL N/A 08/12/2016   Procedure: COLONOSCOPY WITH PROPOFOL;  Surgeon: [name], MD;  Location: [facility] ENDOSCOPY;  Service: Endoscopy;  Laterality: N/A;  . COLONOSCOPY WITH PROPOFOL N/A 05/05/2017   Procedure: COLONOSCOPY WITH PROPOFOL;  Surgeon: Manya Silvas, MD;  Location[facility] ENDOSCOPY;  Service: Endoscopy;  Laterality: N/A;  . CORONARY ANGIOPLASTY  ?   PT UNABLE TO TELL IF  BEFORE OR AFTER  CABG  . CORONARY ARTERY BYPASS GRAFT  2008  ([place] , [place])   3 VESSEL  . DIALYSIS FISTULA CREATION  LAST SURGERY  APPOX  2008  . ENTEROSCOPY N/A 05/10/2016   Procedure: ENTEROSCOPY;  Surgeon: Jerene Bears, MD;  Location: [place];  Service: Gastroenterology;  Laterality: N/A;  . ENTEROSCOPY N/A 08/12/2016   Procedure: ENTEROSCOPY;  Surgeon: [name], MD;  Location: [facility] ENDOSCOPY;  Service: Endoscopy;  Laterality: N/A;  . ENTEROSCOPY Left 06/03/2017   Procedure: ENTEROSCOPY;  Surgeon: Virgel Manifold, MD;  Location: [facility]  ENDOSCOPY;  Service: Endoscopy;  Laterality: Left;  Procedure date will ultimately depend on when patient is medically optimized before the procedure, pending hemodialysis and blood transfusions etc. Will place on schedule and change depending on clinical status.   . ENTEROSCOPY N/A 06/05/2017   Procedure: ENTEROSCOPY;  Surgeon: Virgel Manifold, MD;  Location: [facility] ENDOSCOPY;  Service: Endoscopy;  Laterality: N/A;  . ENTEROSCOPY N/A 06/15/2017   Procedure: Push ENTEROSCOPY;  Surgeon: [name], MD;  Location: [facility] ENDOSCOPY;  Service: Endoscopy;  Laterality: N/A;  . ESOPHAGOGASTRODUODENOSCOPY N/A 05/07/2015   Procedure: ESOPHAGOGASTRODUODENOSCOPY (EGD);  Surgeon: Hulen Luster, MD;  Location: [facility] ENDOSCOPY;  Service: Endoscopy;  Laterality: N/A;  . ESOPHAGOGASTRODUODENOSCOPY (EGD) WITH PROPOFOL N/A 05/17/2015   Procedure: ESOPHAGOGASTRODUODENOSCOPY (EGD) WITH PROPOFOL;  Surgeon: [name], MD;  Location: [facility] ENDOSCOPY;  Service: Endoscopy;  Laterality: N/A;  . ESOPHAGOGASTRODUODENOSCOPY (EGD) WITH PROPOFOL N/A 01/20/2016   Procedure: ESOPHAGOGASTRODUODENOSCOPY (EGD) WITH PROPOFOL;  Surgeon: [name], MD;  Location: [facility] ENDOSCOPY;  Service: Endoscopy;  Laterality: N/A;  . ESOPHAGOGASTRODUODENOSCOPY (EGD) WITH PROPOFOL N/A 04/17/2016  Procedure: ESOPHAGOGASTRODUODENOSCOPY (EGD) WITH PROPOFOL;  Surgeon: Lin Landsman, MD;  Location: [facility] ENDOSCOPY;  Service: Gastroenterology;  Laterality: N/A;  . ESOPHAGOGASTRODUODENOSCOPY (EGD) WITH PROPOFOL  05/09/2016   Procedure: ESOPHAGOGASTRODUODENOSCOPY (EGD) WITH PROPOFOL;  Surgeon: Jerene Bears, MD;  Location: [place];  Service: Endoscopy;;  . ESOPHAGOGASTRODUODENOSCOPY (EGD) WITH PROPOFOL N/A 06/16/2016   Procedure: ESOPHAGOGASTRODUODENOSCOPY (EGD) WITH PROPOFOL;  Surgeon: [name], MD;  Location: [facility] ENDOSCOPY;  Service: Endoscopy;  Laterality: N/A;  . ESOPHAGOGASTRODUODENOSCOPY (EGD) WITH PROPOFOL N/A 05/05/2017   Procedure:  ESOPHAGOGASTRODUODENOSCOPY (EGD) WITH PROPOFOL;  Surgeon: Manya Silvas, MD;  Location: [facility] ENDOSCOPY;  Service: Endoscopy;  Laterality: N/A;  . ESOPHAGOGASTRODUODENOSCOPY (EGD) WITH PROPOFOL N/A 06/15/2017   Procedure: ESOPHAGOGASTRODUODENOSCOPY (EGD) WITH PROPOFOL;  Surgeon: [name], MD;  Location: [facility] ENDOSCOPY;  Service: Endoscopy;  Laterality: N/A;  . GIVENS CAPSULE STUDY N/A 05/07/2016   Procedure: GIVENS CAPSULE STUDY;  Surgeon: Doran Stabler, MD;  Location: [place];  Service: Endoscopy;  Laterality: N/A;  . MANDIBULAR HARDWARE REMOVAL N/A 07/29/2013   Procedure: REMOVAL OF ARCH BARS;  Surgeon: Theodoro Kos, DO;  Location: [place];  Service: Plastics;  Laterality: N/A;  . ORIF MANDIBULAR FRACTURE N/A 06/05/2013   Procedure: REPAIR OF MANDIBULAR FRACTURE x 2 with maxillo-mandibular fixation ;  Surgeon: Theodoro Kos, DO;  Location: [place];  Service: Plastics;  Laterality: N/A;  . PARACENTESIS    . PERIPHERAL ARTERIAL STENT GRAFT Left      Prior to Admission medications   Medication Sig Start Date End Date Taking? Authorizing Provider  albuterol (PROVENTIL HFA;VENTOLIN HFA) 108 (90 Base) MCG/ACT inhaler Inhale 2 puffs into the lungs every 6 (six) hours as needed for wheezing or shortness of breath. 04/21/17   Lisa Roca, MD  alum & mag hydroxide-simeth (MAALOX/MYLANTA) 200-200-20 MG/5ML suspension Take 15 mLs every 4 (four) hours as needed by mouth for indigestion or heartburn. Patient not taking: Reported on 04/19/2017 01/25/17   Demetrios Loll, MD  atorvastatin (LIPITOR) 40 MG tablet Take 1 tablet (40 mg total) by mouth daily. Patient not taking: Reported on 10/20/2017 11/16/16   Vaughan Basta, MD  B Complex-C-Zn-Folic Acid (DIALYVITE/ZINC) TABS Take 1 tablet by mouth daily. 09/08/17   [provider]  budesonide-formoterol (SYMBICORT) 160-4.5 MCG/ACT inhaler Inhale 2 puffs into the lungs daily. 11/16/16   Vaughan Basta, MD  calcium  acetate (PHOSLO) 667 MG capsule Take 2,001 mg by mouth 3 (three) times daily.    [provider]  doxycycline (VIBRA-TABS) 100 MG tablet Take 1 tablet (100 mg total) by mouth 2 (two) times daily. Patient not taking: Reported on 08/28/2017 07/10/17   Harvest Dark, MD  ferrous sulfate 325 (65 FE) MG tablet Take 1 tablet by mouth 3 (three) times daily. 01/21/17   [provider]  furosemide (LASIX) 80 MG tablet Take 1 tablet (80 mg total) daily by mouth. Patient not taking: Reported on 10/20/2017 01/25/17   Demetrios Loll, MD  gabapentin (NEURONTIN) 300 MG capsule Take 300 mg by mouth 2 (two) times daily.  08/16/16   [provider]  ipratropium-albuterol (DUONEB) 0.5-2.5 (3) MG/3ML SOLN Take 3 mLs by nebulization every 6 (six) hours as needed. 11/28/16   Loletha Grayer, MD  labetalol (NORMODYNE) 100 MG tablet Take 100 mg by mouth daily. 03/24/17   [provider]  Multiple Vitamins-Minerals-FA (DIALYVITE SUPREME D) 3 MG TABS Take 1 tablet (3 mg total) by mouth daily. Patient not taking: Reported on 10/20/2017 11/28/16   Loletha Grayer, MD  nitroGLYCERIN (NITROSTAT) 0.4 MG SL tablet Place 1 tablet (0.4 mg total) under the tongue every 5 (five) minutes as needed. 04/12/16   Wende Bushy, MD  omeprazole (PRILOSEC) 40 MG capsule Take 40 mg by mouth daily. 09/08/17   [provider]  pantoprazole (PROTONIX) 40 MG tablet Take 1 tablet (40 mg total) by mouth daily. Patient not taking: Reported on 10/20/2017 11/28/16   Loletha Grayer, MD  Spacer/Aero Chamber Mouthpiece MISC 1 Units by Does not apply route every 4 (four) hours as needed (wheezing). 02/19/17   Darel Hong, MD  tiotropium (SPIRIVA HANDIHALER) 18 MCG inhalation capsule Place 1 capsule (18 mcg total) into inhaler and inhale daily. 11/16/16 11/16/17  Vaughan Basta, MD     Allergies Patient has no known allergies.   Family History  Problem Relation Age of Onset  . Colon cancer Mother   . Cancer  Father   . Cancer Sister   . Kidney disease Brother     Social History Social History   Tobacco Use  . Smoking status: Current Every Day Smoker    Packs/day: 0.15    Years: 40.00    Pack years: 6.00    Types: Cigarettes  . Smokeless tobacco: Never Used  Substance Use Topics  . Alcohol use: No    Comment: pt reports quitting after learning about cirrhosis  . Drug use: No    Frequency: 7.0 times per week    Types: Marijuana, Cocaine    Review of Systems  Constitutional:   No fever or chills.  ENT:   No sore throat. No rhinorrhea. Cardiovascular:   No chest pain or syncope. Respiratory:   No dyspnea or cough. Gastrointestinal:   Negative for abdominal pain, vomiting and diarrhea.  Musculoskeletal:   Positive bilateral lower leg swelling and cramping with walking.   All other systems reviewed and are negative except as documented above in ROS and HPI.  ____________________________________________   PHYSICAL EXAM:  VITAL SIGNS: ED Triage Vitals  Enc Vitals Group     BP 09/25/17 0742 (!) 154/100     Pulse Rate 09/25/17 0742 87     Resp 09/25/17 0742 (!) 21     Temp 09/25/17 0742 98.7 F (37.1 C)     Temp Source 09/25/17 0742 Oral     SpO2 09/25/17 0739 (!) 81 %     Weight 09/25/17 0745 152 lb (68.9 kg)     Height 09/25/17 0745 6\' 3"  (1.905 m)     Head Circumference --      Peak Flow --      Pain Score 09/25/17 0743 0     Pain Loc --      Pain Edu? --      Excl. in [place]? --     Vital signs reviewed, nursing assessments reviewed.   Constitutional:   Alert and oriented. Non-toxic appearance. Eyes:   Conjunctivae are normal. EOMI. ENT      Head:   Normocephalic and atraumatic.         Mouth/Throat:   MMM.       Neck:   No meningismus. Full ROM.  JVD Hematological/Lymphatic/Immunilogical:   No cervical lymphadenopathy. Cardiovascular:   RRR. Symmetric bilateral radial and DP pulses.  No murmurs. Cap refill less than 2 seconds. Respiratory:   Normal respiratory  effort without tachypnea/retractions. Breath sounds are clear and equal bilaterally. No wheezes/rales/rhonchi. Gastrointestinal:   Soft and nontender. Non distended. There is no CVA tenderness.  No rebound, rigidity, or guarding.  Musculoskeletal:   Normal range of motion in all extremities. No joint effusions.  No lower extremity tenderness.  1+ bilateral peripheral edema. Neurologic:   Normal speech and language.  Motor grossly intact. No acute focal neurologic deficits are appreciated.  Skin:    Skin is warm, dry and intact. No rash noted.  No petechiae, purpura, or bullae.  ____________________________________________    LABS (pertinent positives/negatives) (all labs ordered are listed, but only abnormal results are displayed) Labs Reviewed  CBC WITH DIFFERENTIAL/PLATELET - Abnormal; Notable for the following components:      Result Value   RBC 4.04 (*)    Hemoglobin 9.5 (*)    HCT 31.5 (*)    MCV 78.0 (*)    MCH 23.5 (*)    MCHC 30.1 (*)    RDW 19.8 (*)    Lymphs Abs 0.7 (*)    All other components within normal limits  COMPREHENSIVE METABOLIC PANEL - Abnormal; Notable for the following components:   Potassium 7.2 (*)    BUN 50 (*)    Creatinine, Ser 8.97 (*)    Albumin 3.2 (*)    Alkaline Phosphatase 225 (*)    GFR calc non Af Amer 6 (*)    GFR calc Af Amer 7 (*)    All other components within normal limits  TROPONIN I - Abnormal; Notable for the following components:   Troponin I 0.10 (*)    All other components within normal limits  TROPONIN I - Abnormal; Notable for the following components:   Troponin I 0.08 (*)    All other components within normal limits  BASIC METABOLIC PANEL - Abnormal; Notable for the following components:   Potassium 5.6 (*)    Glucose, Bld 141 (*)    BUN 28 (*)    Creatinine, Ser 5.55 (*)    Calcium 8.3 (*)    GFR calc non Af Amer 10 (*)    GFR calc Af Amer 12 (*)    All other components within normal limits  PROTIME-INR    ____________________________________________   EKG  Interpreted by me Normal sinus rhythm rate of 85, right axis, normal intervals.  Normal QRS and ST segments.  Nonacute T wave inversions in inferior leads, unchanged from September 25, 2017.  No acute ischemic changes.  No evidence of conduction disturbance from electrolyte changes.  ____________________________________________    RADIOLOGY  No results found.  ____________________________________________   PROCEDURES Procedures  ____________________________________________    CLINICAL IMPRESSION / ASSESSMENT AND PLAN / ED COURSE  Pertinent labs & imaging results that were available during my care of the patient were reviewed by me and considered in my medical decision making (see chart for details).    Patient presents with some new symptoms her last 24 hours in the setting of missing his last dialysis session.  EKG and chemistry panel are overall reassuring although he does have some mild hyperkalemia.  Not requiring emergent intervention.  He is plan to get his dialysis today within the next few hours and should be stable to wait until then.  We will continue to observe in the ED until that time.       ____________________________________________   FINAL CLINICAL IMPRESSION(S) / ED DIAGNOSES    Final diagnoses:  Hyperkalemia  SOB (shortness of breath)  ESRD on hemodialysis [facility])     ED Discharge Orders    None      Portions of this note were generated with dragon dictation software. Dictation errors may  occur despite best attempts at proofreading.    Carrie Mew, MD 10/25/17 1447

## 2017-10-25 NOTE — Progress Notes (Signed)
Pre HD assessment   10/25/17 1334  Vital Signs  Temp 98.8 F (37.1 C)  Temp Source Oral  Pulse Rate 85  Pulse Rate Source Monitor  Resp 19  BP 121/83  BP Location Left Arm  BP Method Automatic  Patient Position (if appropriate) Sitting  Oxygen Therapy  SpO2 97 %  O2 Device Room Air  Pain Assessment  Pain Scale 0-10  Pain Score 0  Dialysis Weight  Weight 88.2 kg  Type of Weight Pre-Dialysis  Time-Out for Hemodialysis  What Procedure? HD  Pt Identifiers(min of two) First/Last Name;MRN/Account#  Correct Site? Yes  Correct Side? Yes  Correct Procedure? Yes  Consents Verified? Yes  Rad Studies Available? N/A  Safety Precautions Reviewed? Yes  Engineer, civil (consulting) Number  (1A)  Station Number 3  UF/Alarm Test Passed  Conductivity: Meter 13.8  Conductivity: Machine  14.1  pH 7.6  Reverse Osmosis main  Normal Saline Lot Number [id number]  Dialyzer Lot Number [id number]  Disposable Set Lot Number [id number]  Machine Temperature 97.7 F (36.5 C)  Musician and Audible Yes  Blood Lines Intact and Secured Yes  Pre Treatment Patient Checks  Vascular access used during treatment Catheter  Patient is receiving dialysis in a chair Yes  Hepatitis B Surface Antigen Results Negative  Date Hepatitis B Surface Antigen Drawn 06/16/17  Hepatitis B Surface Antibody  (>10)  Date Hepatitis B Surface Antibody Drawn 03/01/17  Hemodialysis Consent Verified Yes  Hemodialysis Standing Orders Initiated Yes  ECG (Telemetry) Monitor On Yes  Prime Ordered Normal Saline  Length of  DialysisTreatment -hour(s) 4 Hour(s)  Dialyzer Elisio 17H NR  Dialysate 1K (1k FOR HALF TX AND 2k FOR SECOND HALF )  Dialysis Anticoagulant Heparin  Dialysate Flow Ordered 800  Blood Flow Rate Ordered 400 mL/min  Ultrafiltration Goal 4 Liters  Pre Treatment Labs Renal panel;CBC  Dialysis Blood Pressure Support Ordered Normal Saline  Education / Care Plan  Dialysis Education Provided Yes  Documented  Education in Care Plan Yes  Hemodialysis Catheter Right Internal jugular Double-lumen  No Placement Date or Time found.   Placed prior to admission: Yes  Orientation: Right  Access Location: Internal jugular  Hemodialysis Catheter Type: Double-lumen  Site Condition No complications  Blue Lumen Status Heparin locked  Red Lumen Status Heparin locked  Purple Lumen Status N/A  Dressing Type Biopatch  Dressing Status Clean;Dry;Intact;Dressing changed  Interventions New dressing  Drainage Description None  Dressing Change Due 11/01/17

## 2017-10-25 NOTE — Progress Notes (Signed)
[facility]  ROUNDING NOTE   Subjective:   Seen and examined during HD   HEMODIALYSIS FLOWSHEET:  Blood Flow Rate (mL/min): 400 mL/min Arterial Pressure (mmHg): -150 mmHg Venous Pressure (mmHg): 120 mmHg Transmembrane Pressure (mmHg): 70 mmHg Ultrafiltration Rate (mL/min): 1380 mL/min Dialysate Flow Rate (mL/min): 800 ml/min Conductivity: Machine : 13.8 Conductivity: Machine : 13.8 Dialysis Fluid Bolus: Normal Saline Bolus Amount (mL): 250 mL    Objective:  Vital signs in last 24 hours:  Temp:  [98.2 F (36.8 C)-98.8 F (37.1 C)] 98.8 F (37.1 C) (08/14 1334) Pulse Rate:  [84-92] 87 (08/14 1504) Resp:  [17-22] 19 (08/14 1504) BP: (103-139)/(62-94) 109/64 (08/14 1504) SpO2:  [91 %-100 %] 92 % (08/14 1504) Weight:  [79.4 kg-88.2 kg] 88.2 kg (08/14 1334)  Weight change:  Filed Weights   10/25/17 1130 10/25/17 1334  Weight: 79.4 kg 88.2 kg    Intake/Output: No intake/output data recorded.   Intake/Output this shift:  No intake/output data recorded.  Physical Exam: General: No acute distress  Head: Normocephalic, atraumatic. Moist oral mucosal membranes  Neck: Supple,   Lungs:  Clear to auscultation, normal effort  Heart: S1S2 no rubs, prominent systolic murmur  Abdomen:  + distended, ascites  Extremities: + peripheral edema.  Neurologic:  sleeping soundly  Skin: No lesions  Access: R IJ permcath    Basic Metabolic Panel: Recent Labs  Lab 10/20/17 1430 10/25/17 1146 10/25/17 1406  NA 139 143 142  K 5.0 6.3* 6.2*  CL 100 99 97*  CO2 26 30 30   GLUCOSE 153* 115* 91  BUN 47* 53* 53*  CREATININE 10.20* 13.32* 13.06*  CALCIUM 7.9* 8.1* 8.0*  PHOS 5.6*  --  7.5*    Liver Function Tests: Recent Labs  Lab 10/20/17 1430 10/25/17 1406  ALBUMIN 2.6* 2.8*   No results for input(s): LIPASE, AMYLASE in the last 168 hours. No results for input(s): AMMONIA in the last 168 hours.  CBC: Recent Labs  Lab 10/20/17 1430 10/25/17 1406  WBC  6.4 9.3  HGB 8.9* 9.3*  HCT 29.2* 30.4*  MCV 73.6* 74.5*  PLT 264 356    Cardiac Enzymes: No results for input(s): CKTOTAL, CKMB, CKMBINDEX, TROPONINI in the last 168 hours.  BNP: Invalid input(s): POCBNP  CBG: No results for input(s): GLUCAP in the last 168 hours.  Microbiology: Results for orders placed or performed during the hospital encounter of 07/12/17  Culture, blood (Routine X 2) w Reflex to ID Panel     Status: None   Collection Time: 07/12/17  4:43 PM  Result Value Ref Range Status   Specimen Description BLOOD LINE CENTRAL  Final   Special Requests   Final    BOTTLES DRAWN AEROBIC AND ANAEROBIC Blood Culture adequate volume   Culture   Final    NO GROWTH 5 DAYS Performed at [facility], [address] [place], [place] [address]    Report Status 07/17/2017 FINAL  Final    Coagulation Studies: No results for input(s): LABPROT, INR in the last 72 hours.  Urinalysis: No results for input(s): COLORURINE, LABSPEC, PHURINE, GLUCOSEU, HGBUR, BILIRUBINUR, KETONESUR, PROTEINUR, UROBILINOGEN, NITRITE, LEUKOCYTESUR in the last 72 hours.  Invalid input(s): APPERANCEUR    Imaging: No results found.   Medications:       Assessment/ Plan:  57 y.o. black male withend stage renal disease on hemodialysis secondary to Alport's syndrome, ascites, hypertension, anemia of chronic kidney disease, coronary artery disease, peripheral vascular disease, hyperlipidemia, gastrointestinal AVMs, pulmonary hypertension  [facility]   1. Hyperkalemia with volume overload:   - Urgent HD with low K bath - Low Potassium diet. - UF as tolerated  2.  End-stage renal disease:    - Seen on hemodialysis today.  Patient refused to go back to [facility] dialysis unit due to conflict with transportation personnel He has expressed to staff that he will return to outpatient HD on saturday   3. Ascites: status post large volume ultrasound guided paracentesis on 8/9  with 7.75 liters removed.   4. Anemia of chronic kidney disease:  - Hgb 9.3   LOS: 0 Jennalyn Cawley 8/14/20193:07 PM

## 2017-10-25 NOTE — ED Triage Notes (Signed)
Pt comes into the ED via POV c/o need for dialysis.  Patient in NAD at this time and denies any other complaints.

## 2017-10-25 NOTE — Progress Notes (Signed)
Pre HD assessment    10/25/17 1335  Neurological  Level of Consciousness Alert  Orientation Level Oriented X4  Respiratory  Respiratory Pattern Regular;Unlabored  Chest Assessment Chest expansion symmetrical  Cough Non-productive  Cardiac  ECG Monitor Yes  Vascular  R Radial Pulse +2  L Radial Pulse +2  Edema Generalized  Integumentary  Integumentary (WDL) X  Skin Color Appropriate for ethnicity  Musculoskeletal  Musculoskeletal (WDL) X  Generalized Weakness Yes  Assistive Device Wheelchair  GU Assessment  Genitourinary (WDL) X  Genitourinary Symptoms  (HD)  Psychosocial  Psychosocial (WDL) WDL

## 2017-10-25 NOTE — Progress Notes (Signed)
Post HD assessment. Pt experienced multiple instances of cramping throughout tx. UF turned off and small bolus of NS given during tx r/t cramping, MD aware. Pt requested to be taken off early, 12 minutes of tx time remaining. Net UF 2941, goal not met.    10/25/17 1745  Vital Signs  Temp 99.3 F (37.4 C)  Temp Source Oral  Pulse Rate 92  Pulse Rate Source Monitor  Resp (!) 25  BP 133/64  BP Location Left Arm  BP Method Automatic  Patient Position (if appropriate) Sitting  Oxygen Therapy  SpO2 97 %  O2 Device Room Air  Dialysis Weight  Weight 86.8 kg  Type of Weight Post-Dialysis  Post-Hemodialysis Assessment  Rinseback Volume (mL) 250 mL  KECN 81 V  Dialyzer Clearance Lightly streaked  Duration of HD Treatment -hour(s) 3.75 hour(s)  Hemodialysis Intake (mL) 650 mL  UF Total -Machine (mL) 3591 mL  Net UF (mL) 2941 mL  Tolerated HD Treatment Yes  Education / Care Plan  Dialysis Education Provided Yes  Documented Education in Care Plan Yes  Hemodialysis Catheter Right Internal jugular Double-lumen  No Placement Date or Time found.   Placed prior to admission: Yes  Orientation: Right  Access Location: Internal jugular  Hemodialysis Catheter Type: Double-lumen  Site Condition No complications  Blue Lumen Status Heparin locked  Red Lumen Status Heparin locked  Purple Lumen Status N/A  Catheter fill solution Heparin 1000 units/ml  Catheter fill volume (Arterial) 1.5 cc  Catheter fill volume (Venous) 1.5  Dressing Type Biopatch  Dressing Status Dressing changed  Interventions New dressing  Drainage Description None  Dressing Change Due 11/01/17  Post treatment catheter status Capped and Clamped

## 2017-10-25 NOTE — Progress Notes (Signed)
Pt walked out of dialysis unit, did not want to wait on ED transport, RN aware.    10/25/17 1801  Hand-Off documentation  Report received from (Full Name) Stark Bray

## 2017-10-25 NOTE — ED Notes (Signed)
RN called dialysis who report they will not be ready for pt for at least an hour and a half. Pt updated.

## 2017-10-25 NOTE — Progress Notes (Signed)
HD tx end    10/25/17 1738  Vital Signs  Pulse Rate 87  Pulse Rate Source Monitor  Resp 16  BP 139/66  BP Location Left Arm  BP Method Automatic  Patient Position (if appropriate) Sitting  Oxygen Therapy  SpO2 95 %  O2 Device Room Air  During Hemodialysis Assessment  Dialysis Fluid Bolus Normal Saline  Bolus Amount (mL) 250 mL  Intra-Hemodialysis Comments Tx completed;See progress note

## 2017-10-25 NOTE — ED Provider Notes (Signed)
[facility] Emergency Department Provider Note  ____________________________________________  Time seen: Approximately 2:40 PM  I have reviewed the triage vital signs and the nursing notes.   HISTORY  Chief Complaint Shortness of Breath    HPI Stanley [name] is a 57 y.o. male with a history of end-stage renal disease on hemodialysis, CHF, CAD, drug and alcohol abuse who comes to the ED today for routine dialysis.  Does report new symptoms over the last 24 hours including occasional dizziness with standing and rapidly fatiguing bilateral lower extremities when he walks.  He attributes this to peripheral artery disease.   Denies chest pain or shortness of breath.  No syncope.  He notes that he did miss his dialysis session 2 days ago on Monday.     Past Medical History:  Diagnosis Date  . Alcohol abuse   . CHF (congestive heart failure) ([place])   . Cirrhosis ([place])   . Coronary artery disease 2009  . Drug abuse ([place])   . End stage renal disease on dialysis [facility]) NEPHROLOGIST-   DR [facility]  IN [place]   HEMODIALYSIS --   TUES/  THURS/  SAT  . Gastrointestinal bleed 06/13/2017   From chart...hx of multiple GI bleeds  . GERD (gastroesophageal reflux disease)   . Hyperlipidemia   . Hypertension   . PAD (peripheral artery disease) ([place])   . Renal insufficiency    Per pt, 32 oz fluid restriction per day  . S/P triple vessel bypass 06/09/2016   2009ish  . Suicidal ideation    & HOMICIDAL IDEATION --  06-16-2013   ADMITTED TO BEHAVIOR HEALTH     Patient Active Problem List   Diagnosis Date Noted  . ESRD (end stage renal disease) on dialysis ([place]) 07/28/2017  . Protein-calorie malnutrition, severe 06/14/2017  . Encounter for dialysis [facility])   . Palliative care by specialist   . Goals of care, counseling/discussion   . Malnutrition of moderate degree 06/05/2017  . Secondary esophageal varices without bleeding ([place])   . Stomach irritation   .  Idiopathic esophageal varices without bleeding ([place])   . Alcoholic hepatitis with ascites 05/24/2017  . ESRD (end stage renal disease) ([place]) 04/28/2017  . Uremia 03/08/2017  . ESRD on hemodialysis ([place]) 03/03/2017  . Weakness 02/28/2017  . Hypocalcemia 02/22/2017  . Shortness of breath 11/26/2016  . COPD (chronic obstructive pulmonary disease) ([place]) 10/30/2016  . COPD exacerbation ([place]) 10/29/2016  . Anemia   . Heme positive stool   . Ulceration of intestine   . Benign neoplasm of transverse colon   . Acute gastrointestinal hemorrhage   . Esophageal candidiasis ([place])   . Angiodysplasia of intestinal tract   . Acute respiratory failure with hypoxia ([place]) 07/03/2016  . GI bleeding 06/24/2016  . Rectal bleeding 06/14/2016  . Anemia of chronic disease 06/01/2016  . MRSA carrier 06/01/2016  . Chronic renal failure 05/23/2016  . Ischemic heart disease 05/23/2016  . Angiodysplasia of small intestine   . Melena   . Small bowel bleed not requiring more than 4 units of blood in 24 hours, ICU, or surgery   . Anemia due to chronic blood loss   . Abdominal pain 05/05/2016  . Acute posthemorrhagic anemia 04/17/2016  . Gastrointestinal bleed 04/17/2016  . History of esophagogastroduodenoscopy (EGD) 04/17/2016  . Elevated troponin 04/17/2016  . Alcohol abuse 04/17/2016  . Upper GI bleed 01/19/2016  . Blood in stool   . Angiodysplasia of stomach and duodenum with hemorrhage   .  Gastritis   . Reflux esophagitis   . GI bleed 05/16/2015  . Acute GI bleeding   . Symptomatic anemia 04/30/2015  . HTN (hypertension) 04/06/2015  . GERD (gastroesophageal reflux disease) 04/06/2015  . HLD (hyperlipidemia) 04/06/2015  . Dyspnea 04/06/2015  . Cirrhosis of liver with ascites ([place]) 04/06/2015  . Ascites 04/06/2015  . GIB (gastrointestinal bleeding) 03/23/2015  . Homicidal ideation 06/19/2013  . Suicidal intent 06/19/2013  . Homicidal ideations 06/19/2013  . Hyperkalemia 06/16/2013  . Mandible  fracture ([place]) 06/05/2013  . Fracture, mandible ([place]) 06/02/2013  . Coronary atherosclerosis of native coronary artery 06/02/2013  . ESRD on dialysis ([place]) 06/02/2013  . Mandible open fracture ([place]) 06/02/2013     Past Surgical History:  Procedure Laterality Date  . A/V FISTULAGRAM Right 06/06/2017   Procedure: A/V FISTULAGRAM;  Surgeon: Katha Cabal, MD;  Location: [place] CV LAB;  Service: Cardiovascular;  Laterality: Right;  . A/V SHUNT INTERVENTION N/A 06/06/2017   Procedure: A/V SHUNT INTERVENTION;  Surgeon: Katha Cabal, MD;  Location: [place] CV LAB;  Service: Cardiovascular;  Laterality: N/A;  . AGILE CAPSULE N/A 06/19/2016   Procedure: AGILE CAPSULE;  Surgeon: [name], MD;  Location: [facility] ENDOSCOPY;  Service: Endoscopy;  Laterality: N/A;  . COLONOSCOPY WITH PROPOFOL N/A 06/18/2016   Procedure: COLONOSCOPY WITH PROPOFOL;  Surgeon: [name], MD;  Location: [facility] ENDOSCOPY;  Service: Endoscopy;  Laterality: N/A;  . COLONOSCOPY WITH PROPOFOL N/A 08/12/2016   Procedure: COLONOSCOPY WITH PROPOFOL;  Surgeon: [name], MD;  Location: [facility] ENDOSCOPY;  Service: Endoscopy;  Laterality: N/A;  . COLONOSCOPY WITH PROPOFOL N/A 05/05/2017   Procedure: COLONOSCOPY WITH PROPOFOL;  Surgeon: Manya Silvas, MD;  Location: [facility] ENDOSCOPY;  Service: Endoscopy;  Laterality: N/A;  . CORONARY ANGIOPLASTY  ?   PT UNABLE TO TELL IF  BEFORE OR AFTER  CABG  . CORONARY ARTERY BYPASS GRAFT  2008  ([place] , [place])   3 VESSEL  . DIALYSIS FISTULA CREATION  LAST SURGERY  APPOX  2008  . ENTEROSCOPY N/A 05/10/2016   Procedure: ENTEROSCOPY;  Surgeon: Jerene Bears, MD;  Location: [place];  Service: Gastroenterology;  Laterality: N/A;  . ENTEROSCOPY N/A 08/12/2016   Procedure: ENTEROSCOPY;  Surgeon: [name], MD;  Location: [facility] ENDOSCOPY;  Service: Endoscopy;  Laterality: N/A;  . ENTEROSCOPY Left 06/03/2017   Procedure: ENTEROSCOPY;  Surgeon: Virgel Manifold, MD;  Location: [facility]  ENDOSCOPY;  Service: Endoscopy;  Laterality: Left;  Procedure date will ultimately depend on when patient is medically optimized before the procedure, pending hemodialysis and blood transfusions etc. Will place on schedule and change depending on clinical status.   . ENTEROSCOPY N/A 06/05/2017   Procedure: ENTEROSCOPY;  Surgeon: Virgel Manifold, MD;  Location: [facility] ENDOSCOPY;  Service: Endoscopy;  Laterality: N/A;  . ENTEROSCOPY N/A 06/15/2017   Procedure: Push ENTEROSCOPY;  Surgeon: [name], MD;  Location: [facility] ENDOSCOPY;  Service: Endoscopy;  Laterality: N/A;  . ESOPHAGOGASTRODUODENOSCOPY N/A 05/07/2015   Procedure: ESOPHAGOGASTRODUODENOSCOPY (EGD);  Surgeon: Hulen Luster, MD;  Location: [facility] ENDOSCOPY;  Service: Endoscopy;  Laterality: N/A;  . ESOPHAGOGASTRODUODENOSCOPY (EGD) WITH PROPOFOL N/A 05/17/2015   Procedure: ESOPHAGOGASTRODUODENOSCOPY (EGD) WITH PROPOFOL;  Surgeon: [name], MD;  Location: [facility] ENDOSCOPY;  Service: Endoscopy;  Laterality: N/A;  . ESOPHAGOGASTRODUODENOSCOPY (EGD) WITH PROPOFOL N/A 01/20/2016   Procedure: ESOPHAGOGASTRODUODENOSCOPY (EGD) WITH PROPOFOL;  Surgeon: [name], MD;  Location: [facility] ENDOSCOPY;  Service: Endoscopy;  Laterality: N/A;  . ESOPHAGOGASTRODUODENOSCOPY (EGD) WITH PROPOFOL N/A 04/17/2016  Procedure: ESOPHAGOGASTRODUODENOSCOPY (EGD) WITH PROPOFOL;  Surgeon: Lin Landsman, MD;  Location: [facility] ENDOSCOPY;  Service: Gastroenterology;  Laterality: N/A;  . ESOPHAGOGASTRODUODENOSCOPY (EGD) WITH PROPOFOL  05/09/2016   Procedure: ESOPHAGOGASTRODUODENOSCOPY (EGD) WITH PROPOFOL;  Surgeon: Jerene Bears, MD;  Location: [place];  Service: Endoscopy;;  . ESOPHAGOGASTRODUODENOSCOPY (EGD) WITH PROPOFOL N/A 06/16/2016   Procedure: ESOPHAGOGASTRODUODENOSCOPY (EGD) WITH PROPOFOL;  Surgeon: [name], MD;  Location: [facility] ENDOSCOPY;  Service: Endoscopy;  Laterality: N/A;  . ESOPHAGOGASTRODUODENOSCOPY (EGD) WITH PROPOFOL N/A 05/05/2017   Procedure:  ESOPHAGOGASTRODUODENOSCOPY (EGD) WITH PROPOFOL;  Surgeon: Manya Silvas, MD;  Location: [facility] ENDOSCOPY;  Service: Endoscopy;  Laterality: N/A;  . ESOPHAGOGASTRODUODENOSCOPY (EGD) WITH PROPOFOL N/A 06/15/2017   Procedure: ESOPHAGOGASTRODUODENOSCOPY (EGD) WITH PROPOFOL;  Surgeon: [name], MD;  Location: [facility] ENDOSCOPY;  Service: Endoscopy;  Laterality: N/A;  . GIVENS CAPSULE STUDY N/A 05/07/2016   Procedure: GIVENS CAPSULE STUDY;  Surgeon: Doran Stabler, MD;  Location: [place];  Service: Endoscopy;  Laterality: N/A;  . MANDIBULAR HARDWARE REMOVAL N/A 07/29/2013   Procedure: REMOVAL OF ARCH BARS;  Surgeon: Theodoro Kos, DO;  Location: [place];  Service: Plastics;  Laterality: N/A;  . ORIF MANDIBULAR FRACTURE N/A 06/05/2013   Procedure: REPAIR OF MANDIBULAR FRACTURE x 2 with maxillo-mandibular fixation ;  Surgeon: Theodoro Kos, DO;  Location: [place];  Service: Plastics;  Laterality: N/A;  . PARACENTESIS    . PERIPHERAL ARTERIAL STENT GRAFT Left      Prior to Admission medications   Medication Sig Start Date End Date Taking? Authorizing Provider  albuterol (PROVENTIL HFA;VENTOLIN HFA) 108 (90 Base) MCG/ACT inhaler Inhale 2 puffs into the lungs every 6 (six) hours as needed for wheezing or shortness of breath. 04/21/17   Lisa Roca, MD  alum & mag hydroxide-simeth (MAALOX/MYLANTA) 200-200-20 MG/5ML suspension Take 15 mLs every 4 (four) hours as needed by mouth for indigestion or heartburn. Patient not taking: Reported on 04/19/2017 01/25/17   Demetrios Loll, MD  atorvastatin (LIPITOR) 40 MG tablet Take 1 tablet (40 mg total) by mouth daily. Patient not taking: Reported on 10/20/2017 11/16/16   Vaughan Basta, MD  B Complex-C-Zn-Folic Acid (DIALYVITE/ZINC) TABS Take 1 tablet by mouth daily. 09/08/17   [provider]  budesonide-formoterol (SYMBICORT) 160-4.5 MCG/ACT inhaler Inhale 2 puffs into the lungs daily. 11/16/16   Vaughan Basta, MD  calcium  acetate (PHOSLO) 667 MG capsule Take 2,001 mg by mouth 3 (three) times daily.    [provider]  doxycycline (VIBRA-TABS) 100 MG tablet Take 1 tablet (100 mg total) by mouth 2 (two) times daily. Patient not taking: Reported on 08/28/2017 07/10/17   Harvest Dark, MD  ferrous sulfate 325 (65 FE) MG tablet Take 1 tablet by mouth 3 (three) times daily. 01/21/17   [provider]  furosemide (LASIX) 80 MG tablet Take 1 tablet (80 mg total) daily by mouth. Patient not taking: Reported on 10/20/2017 01/25/17   Demetrios Loll, MD  gabapentin (NEURONTIN) 300 MG capsule Take 300 mg by mouth 2 (two) times daily.  08/16/16   [provider]  ipratropium-albuterol (DUONEB) 0.5-2.5 (3) MG/3ML SOLN Take 3 mLs by nebulization every 6 (six) hours as needed. 11/28/16   Loletha Grayer, MD  labetalol (NORMODYNE) 100 MG tablet Take 100 mg by mouth daily. 03/24/17   [provider]  Multiple Vitamins-Minerals-FA (DIALYVITE SUPREME D) 3 MG TABS Take 1 tablet (3 mg total) by mouth daily. Patient not taking: Reported on 10/20/2017 11/28/16   Loletha Grayer, MD  nitroGLYCERIN (NITROSTAT) 0.4 MG SL tablet Place 1 tablet (0.4 mg total) under the tongue every 5 (five) minutes as needed. 04/12/16   Wende Bushy, MD  omeprazole (PRILOSEC) 40 MG capsule Take 40 mg by mouth daily. 09/08/17   [provider]  pantoprazole (PROTONIX) 40 MG tablet Take 1 tablet (40 mg total) by mouth daily. Patient not taking: Reported on 10/20/2017 11/28/16   Loletha Grayer, MD  Spacer/Aero Chamber Mouthpiece MISC 1 Units by Does not apply route every 4 (four) hours as needed (wheezing). 02/19/17   Darel Hong, MD  tiotropium (SPIRIVA HANDIHALER) 18 MCG inhalation capsule Place 1 capsule (18 mcg total) into inhaler and inhale daily. 11/16/16 11/16/17  Vaughan Basta, MD     Allergies Patient has no known allergies.   Family History  Problem Relation Age of Onset  . Colon cancer Mother   . Cancer  Father   . Cancer Sister   . Kidney disease Brother     Social History Social History   Tobacco Use  . Smoking status: Current Every Day Smoker    Packs/day: 0.15    Years: 40.00    Pack years: 6.00    Types: Cigarettes  . Smokeless tobacco: Never Used  Substance Use Topics  . Alcohol use: No    Comment: pt reports quitting after learning about cirrhosis  . Drug use: No    Frequency: 7.0 times per week    Types: Marijuana, Cocaine    Review of Systems  Constitutional:   No fever or chills.  ENT:   No sore throat. No rhinorrhea. Cardiovascular:   No chest pain or syncope. Respiratory:   No dyspnea or cough. Gastrointestinal:   Negative for abdominal pain, vomiting and diarrhea.  Musculoskeletal:   Positive bilateral lower leg swelling and cramping with walking.   All other systems reviewed and are negative except as documented above in ROS and HPI.  ____________________________________________   PHYSICAL EXAM:  VITAL SIGNS: ED Triage Vitals  Enc Vitals Group     BP 09/25/17 0742 (!) 154/100     Pulse Rate 09/25/17 0742 87     Resp 09/25/17 0742 (!) 21     Temp 09/25/17 0742 98.7 F (37.1 C)     Temp Source 09/25/17 0742 Oral     SpO2 09/25/17 0739 (!) 81 %     Weight 09/25/17 0745 152 lb (68.9 kg)     Height 09/25/17 0745 6\' 3"  (1.905 m)     Head Circumference --      Peak Flow --      Pain Score 09/25/17 0743 0     Pain Loc --      Pain Edu? --      Excl. in [place]? --     Vital signs reviewed, nursing assessments reviewed.   Constitutional:   Alert and oriented. Non-toxic appearance. Eyes:   Conjunctivae are normal. EOMI. ENT      Head:   Normocephalic and atraumatic.         Mouth/Throat:   MMM.       Neck:   No meningismus. Full ROM.  JVD Hematological/Lymphatic/Immunilogical:   No cervical lymphadenopathy. Cardiovascular:   RRR. Symmetric bilateral radial and DP pulses.  No murmurs. Cap refill less than 2 seconds. Respiratory:   Normal respiratory  effort without tachypnea/retractions. Breath sounds are clear and equal bilaterally. No wheezes/rales/rhonchi. Gastrointestinal:   Soft and nontender. Non distended. There is no CVA tenderness.  No rebound, rigidity, or guarding.  Musculoskeletal:   Normal range of motion in all extremities. No joint effusions.  No lower extremity tenderness.  1+ bilateral peripheral edema. Neurologic:   Normal speech and language.  Motor grossly intact. No acute focal neurologic deficits are appreciated.  Skin:    Skin is warm, dry and intact. No rash noted.  No petechiae, purpura, or bullae.  ____________________________________________    LABS (pertinent positives/negatives) (all labs ordered are listed, but only abnormal results are displayed) Labs Reviewed  CBC WITH DIFFERENTIAL/PLATELET - Abnormal; Notable for the following components:      Result Value   RBC 4.04 (*)    Hemoglobin 9.5 (*)    HCT 31.5 (*)    MCV 78.0 (*)    MCH 23.5 (*)    MCHC 30.1 (*)    RDW 19.8 (*)    Lymphs Abs 0.7 (*)    All other components within normal limits  COMPREHENSIVE METABOLIC PANEL - Abnormal; Notable for the following components:   Potassium 7.2 (*)    BUN 50 (*)    Creatinine, Ser 8.97 (*)    Albumin 3.2 (*)    Alkaline Phosphatase 225 (*)    GFR calc non Af Amer 6 (*)    GFR calc Af Amer 7 (*)    All other components within normal limits  TROPONIN I - Abnormal; Notable for the following components:   Troponin I 0.10 (*)    All other components within normal limits  TROPONIN I - Abnormal; Notable for the following components:   Troponin I 0.08 (*)    All other components within normal limits  BASIC METABOLIC PANEL - Abnormal; Notable for the following components:   Potassium 5.6 (*)    Glucose, Bld 141 (*)    BUN 28 (*)    Creatinine, Ser 5.55 (*)    Calcium 8.3 (*)    GFR calc non Af Amer 10 (*)    GFR calc Af Amer 12 (*)    All other components within normal limits  PROTIME-INR    ____________________________________________   EKG  Interpreted by me Normal sinus rhythm rate of 85, right axis, normal intervals.  Normal QRS and ST segments.  Nonacute T wave inversions in inferior leads, unchanged from September 25, 2017.  No acute ischemic changes.  No evidence of conduction disturbance from electrolyte changes.  ____________________________________________    RADIOLOGY  No results found.  ____________________________________________   PROCEDURES Procedures  ____________________________________________    CLINICAL IMPRESSION / ASSESSMENT AND PLAN / ED COURSE  Pertinent labs & imaging results that were available during my care of the patient were reviewed by me and considered in my medical decision making (see chart for details).    Patient presents with some new symptoms her last 24 hours in the setting of missing his last dialysis session.  EKG and chemistry panel are overall reassuring although he does have some mild hyperkalemia.  Not requiring emergent intervention.  He is plan to get his dialysis today within the next few hours and should be stable to wait until then.  We will continue to observe in the ED until that time.  Clinical Course as of Oct 26 1447  Wed Oct 25, 2017  1237 Labs show hyperkalemia, without ekg changes. Mild symptoms. Low risk for acute dysrhythmia. Will continue to observe in ED prior to HD which plans to start in 30 minutes.   Potassium(!!): 6.3 [PS]    Clinical Course User Index [PS] Carrie Mew, MD  ____________________________________________   FINAL CLINICAL IMPRESSION(S) / ED DIAGNOSES    Final diagnoses:  Hyperkalemia  SOB (shortness of breath)  ESRD on hemodialysis [facility])     ED Discharge Orders    None      Portions of this note were generated with dragon dictation software. Dictation errors may occur despite best attempts at proofreading.    Carrie Mew, MD 10/25/17 1450

## 2017-10-26 NOTE — ED Provider Notes (Deleted)
     Earleen Newport, MD 10/26/17 [phone]

## 2017-10-28 DIAGNOSIS — T782XXA Anaphylactic shock, unspecified, initial encounter: Secondary | ICD-10-CM | POA: Diagnosis not present

## 2017-10-28 DIAGNOSIS — N186 End stage renal disease: Secondary | ICD-10-CM | POA: Diagnosis not present

## 2017-10-28 DIAGNOSIS — N2581 Secondary hyperparathyroidism of renal origin: Secondary | ICD-10-CM | POA: Diagnosis not present

## 2017-10-28 DIAGNOSIS — Z992 Dependence on renal dialysis: Secondary | ICD-10-CM | POA: Diagnosis not present

## 2017-10-28 DIAGNOSIS — D509 Iron deficiency anemia, unspecified: Secondary | ICD-10-CM | POA: Diagnosis not present

## 2017-10-29 ENCOUNTER — Inpatient Hospital Stay
Admission: EM | Admit: 2017-10-29 | Discharge: 2017-10-31 | DRG: 291 | Disposition: A | Discharge status: Home / Self Care | Payer: Medicare Other | Attending: Internal Medicine | Admitting: Internal Medicine

## 2017-10-29 ENCOUNTER — Emergency Department: Payer: Medicare Other

## 2017-10-29 DIAGNOSIS — E875 Hyperkalemia: Secondary | ICD-10-CM | POA: Diagnosis not present

## 2017-10-29 DIAGNOSIS — N186 End stage renal disease: Secondary | ICD-10-CM | POA: Diagnosis not present

## 2017-10-29 DIAGNOSIS — Z79899 Other long term (current) drug therapy: Secondary | ICD-10-CM | POA: Diagnosis not present

## 2017-10-29 DIAGNOSIS — D509 Iron deficiency anemia, unspecified: Secondary | ICD-10-CM | POA: Diagnosis present

## 2017-10-29 DIAGNOSIS — Z992 Dependence on renal dialysis: Secondary | ICD-10-CM

## 2017-10-29 DIAGNOSIS — R188 Other ascites: Secondary | ICD-10-CM

## 2017-10-29 DIAGNOSIS — I5033 Acute on chronic diastolic (congestive) heart failure: Secondary | ICD-10-CM | POA: Diagnosis present

## 2017-10-29 DIAGNOSIS — I272 Pulmonary hypertension, unspecified: Secondary | ICD-10-CM | POA: Diagnosis present

## 2017-10-29 DIAGNOSIS — Z95828 Presence of other vascular implants and grafts: Secondary | ICD-10-CM | POA: Diagnosis not present

## 2017-10-29 DIAGNOSIS — J449 Chronic obstructive pulmonary disease, unspecified: Secondary | ICD-10-CM | POA: Diagnosis present

## 2017-10-29 DIAGNOSIS — R778 Other specified abnormalities of plasma proteins: Secondary | ICD-10-CM

## 2017-10-29 DIAGNOSIS — Z9115 Patient's noncompliance with renal dialysis: Secondary | ICD-10-CM | POA: Diagnosis not present

## 2017-10-29 DIAGNOSIS — I361 Nonrheumatic tricuspid (valve) insufficiency: Secondary | ICD-10-CM | POA: Diagnosis not present

## 2017-10-29 DIAGNOSIS — J811 Chronic pulmonary edema: Secondary | ICD-10-CM

## 2017-10-29 DIAGNOSIS — K7031 Alcoholic cirrhosis of liver with ascites: Secondary | ICD-10-CM | POA: Diagnosis present

## 2017-10-29 DIAGNOSIS — I132 Hypertensive heart and chronic kidney disease with heart failure and with stage 5 chronic kidney disease, or end stage renal disease: Principal | ICD-10-CM | POA: Diagnosis present

## 2017-10-29 DIAGNOSIS — Q8781 Alport syndrome: Secondary | ICD-10-CM

## 2017-10-29 DIAGNOSIS — F1010 Alcohol abuse, uncomplicated: Secondary | ICD-10-CM | POA: Diagnosis present

## 2017-10-29 DIAGNOSIS — I739 Peripheral vascular disease, unspecified: Secondary | ICD-10-CM | POA: Diagnosis present

## 2017-10-29 DIAGNOSIS — I2510 Atherosclerotic heart disease of native coronary artery without angina pectoris: Secondary | ICD-10-CM | POA: Diagnosis present

## 2017-10-29 DIAGNOSIS — D631 Anemia in chronic kidney disease: Secondary | ICD-10-CM | POA: Diagnosis present

## 2017-10-29 DIAGNOSIS — R0600 Dyspnea, unspecified: Secondary | ICD-10-CM

## 2017-10-29 DIAGNOSIS — R0602 Shortness of breath: Secondary | ICD-10-CM | POA: Diagnosis not present

## 2017-10-29 DIAGNOSIS — Z951 Presence of aortocoronary bypass graft: Secondary | ICD-10-CM | POA: Diagnosis not present

## 2017-10-29 DIAGNOSIS — R748 Abnormal levels of other serum enzymes: Secondary | ICD-10-CM | POA: Diagnosis not present

## 2017-10-29 DIAGNOSIS — N189 Chronic kidney disease, unspecified: Secondary | ICD-10-CM | POA: Diagnosis not present

## 2017-10-29 DIAGNOSIS — Z841 Family history of disorders of kidney and ureter: Secondary | ICD-10-CM | POA: Diagnosis not present

## 2017-10-29 DIAGNOSIS — J9600 Acute respiratory failure, unspecified whether with hypoxia or hypercapnia: Secondary | ICD-10-CM | POA: Diagnosis present

## 2017-10-29 DIAGNOSIS — E785 Hyperlipidemia, unspecified: Secondary | ICD-10-CM | POA: Diagnosis present

## 2017-10-29 DIAGNOSIS — R7989 Other specified abnormal findings of blood chemistry: Secondary | ICD-10-CM

## 2017-10-29 DIAGNOSIS — R0603 Acute respiratory distress: Secondary | ICD-10-CM | POA: Diagnosis not present

## 2017-10-29 DIAGNOSIS — J810 Acute pulmonary edema: Secondary | ICD-10-CM | POA: Diagnosis not present

## 2017-10-29 DIAGNOSIS — I129 Hypertensive chronic kidney disease with stage 1 through stage 4 chronic kidney disease, or unspecified chronic kidney disease: Secondary | ICD-10-CM | POA: Diagnosis not present

## 2017-10-29 LAB — CBC WITH DIFFERENTIAL/PLATELET
BASOS ABS: 0.1 10*3/uL (ref 0–0.1)
Basophils Relative: 1 %
EOS PCT: 2 %
Eosinophils Absolute: 0.2 10*3/uL (ref 0–0.7)
HEMATOCRIT: 29.2 % — AB (ref 40.0–52.0)
Hemoglobin: 9.2 g/dL — ABNORMAL LOW (ref 13.0–18.0)
LYMPHS ABS: 0.9 10*3/uL — AB (ref 1.0–3.6)
LYMPHS PCT: 11 %
MCH: 23 pg — AB (ref 26.0–34.0)
MCHC: 31.6 g/dL — ABNORMAL LOW (ref 32.0–36.0)
MCV: 72.6 fL — AB (ref 80.0–100.0)
MONO ABS: 1 10*3/uL (ref 0.2–1.0)
Monocytes Relative: 13 %
NEUTROS ABS: 5.7 10*3/uL (ref 1.4–6.5)
Neutrophils Relative %: 73 %
Platelets: 321 10*3/uL (ref 150–440)
RBC: 4.02 MIL/uL — ABNORMAL LOW (ref 4.40–5.90)
RDW: 19.5 % — AB (ref 11.5–14.5)
WBC: 7.9 10*3/uL (ref 3.8–10.6)

## 2017-10-29 LAB — BASIC METABOLIC PANEL
Anion gap: 13 (ref 5–15)
BUN: 33 mg/dL — AB (ref 6–20)
CALCIUM: 8.1 mg/dL — AB (ref 8.9–10.3)
CO2: 27 mmol/L (ref 22–32)
Chloride: 101 mmol/L (ref 98–111)
Creatinine, Ser: 9.14 mg/dL — ABNORMAL HIGH (ref 0.61–1.24)
GFR calc Af Amer: 6 mL/min — ABNORMAL LOW (ref >60)
GFR calc non Af Amer: 6 mL/min — ABNORMAL LOW (ref >60)
GLUCOSE: 110 mg/dL — AB (ref 70–99)
POTASSIUM: 5.7 mmol/L — AB (ref 3.5–5.1)
Sodium: 141 mmol/L (ref 135–145)

## 2017-10-29 LAB — TROPONIN I: Troponin I: 0.07 ng/mL (ref <0.03)

## 2017-10-29 LAB — BRAIN NATRIURETIC PEPTIDE: B Natriuretic Peptide: 3076 pg/mL — ABNORMAL HIGH (ref 0.0–100.0)

## 2017-10-29 MED ORDER — DIPHENHYDRAMINE HCL 50 MG/ML IJ SOLN
12.5000 mg | INTRAMUSCULAR | Status: AC
  Administered 2017-10-29: 12.5 mg via INTRAVENOUS
  Filled 2017-10-29: qty 1

## 2017-10-29 MED ORDER — OXYCODONE-ACETAMINOPHEN 5-325 MG PO TABS
2.0000 | ORAL_TABLET | Freq: Once | ORAL | Status: AC
Start: 2017-10-29 — End: 2017-10-29
  Administered 2017-10-29: 2 via ORAL
  Filled 2017-10-29: qty 2

## 2017-10-29 NOTE — ED Notes (Signed)
Patient is a T/TH/Sat dialysis patient. Patient went to dialysis yesterday, was only able to tolerate 1 1/2 hours of dialysis due to muscle cramping. Patient reports hx of multiple previous similar occurences. Patient's abdomen is distended. Patient c/o SOB.

## 2017-10-29 NOTE — H&P (Signed)
Stanley [name] at [place] NAME: Montrail [name]    MR#:  [id number]  DATE OF BIRTH:  03-Feb-1961  DATE OF ADMISSION:  10/29/2017  PRIMARY CARE PHYSICIAN: Manya Silvas, MD   REQUESTING/REFERRING PHYSICIAN:   CHIEF COMPLAINT:   Chief Complaint  Patient presents with  . Shortness of Breath    HISTORY OF PRESENT ILLNESS: Stanley [name]  is a 57 y.o. male with a known history of tobacco, alcohol abuse, end-stage renal disease on hemodialysis, CHF and alcoholic cirrhosis with recurrent large ascites. Patient presented to emergency room for worsening shortness of breath going on for the past 2 days.  His symptoms are worse with exertion and with laying down flat.  Patient thinks that his shortness of breath is likely precipitated by the fact that his last hemodialysis he has had to be short due to muscle cramps.  Also patient complains of worsening abdominal distention in the past 3 days, although he had paracentesis done just 1 week ago. Patient denies any chest pain, no fever or chills, no nausea, vomiting, diarrhea. Blood test in the emergency room are remarkable for hemoglobin level of 9.2, troponin level is 0.07 and creatinine level at 9.14. Oxygen saturation is 96% on room air. EKG shows normal sinus rhythm with heart rate at 89 bpm, no evidence of acute ischemia. Chest x-ray is suggestive of pulmonary edema. Patient is admitted for further evaluation and treatment.   PAST MEDICAL HISTORY:   Past Medical History:  Diagnosis Date  . Alcohol abuse   . CHF (congestive heart failure) ([place])   . Cirrhosis ([place])   . Coronary artery disease 2009  . Drug abuse ([place])   . End stage renal disease on dialysis [facility]) NEPHROLOGIST-   DR [facility]  IN [place]   HEMODIALYSIS --   TUES/  THURS/  SAT  . Gastrointestinal bleed 06/13/2017   From chart...hx of multiple GI bleeds  . GERD (gastroesophageal reflux disease)   . Hyperlipidemia   .  Hypertension   . PAD (peripheral artery disease) ([place])   . Renal insufficiency    Per pt, 32 oz fluid restriction per day  . S/P triple vessel bypass 06/09/2016   2009ish  . Suicidal ideation    & HOMICIDAL IDEATION --  06-16-2013   ADMITTED TO BEHAVIOR HEALTH    PAST SURGICAL HISTORY:  Past Surgical History:  Procedure Laterality Date  . A/V FISTULAGRAM Right 06/06/2017   Procedure: A/V FISTULAGRAM;  Surgeon: Katha Cabal, MD;  Location: [place] [facility];  Service: Cardiovascular;  Laterality: Right;  . A/V SHUNT INTERVENTION N/A 06/06/2017   Procedure: A/V SHUNT INTERVENTION;  Surgeon: Katha Cabal, MD;  Location: [place] [facility];  Service: Cardiovascular;  Laterality: N/A;  . AGILE CAPSULE N/A 06/19/2016   Procedure: AGILE CAPSULE;  Surgeon: [name], MD;  Location: [facility] ENDOSCOPY;  Service: Endoscopy;  Laterality: N/A;  . COLONOSCOPY WITH PROPOFOL N/A 06/18/2016   Procedure: COLONOSCOPY WITH PROPOFOL;  Surgeon: [name], MD;  Location: [facility] ENDOSCOPY;  Service: Endoscopy;  Laterality: N/A;  . COLONOSCOPY WITH PROPOFOL N/A 08/12/2016   Procedure: COLONOSCOPY WITH PROPOFOL;  Surgeon: [name], MD;  Location: [facility] ENDOSCOPY;  Service: Endoscopy;  Laterality: N/A;  . COLONOSCOPY WITH PROPOFOL N/A 05/05/2017   Procedure: COLONOSCOPY WITH PROPOFOL;  Surgeon: Manya Silvas, MD;  Location: [facility] ENDOSCOPY;  Service: Endoscopy;  Laterality: N/A;  . CORONARY ANGIOPLASTY  ?   PT UNABLE TO TELL IF  BEFORE OR AFTER  CABG  . CORONARY ARTERY BYPASS GRAFT  2008  ([place] , [place])   3 VESSEL  . DIALYSIS FISTULA CREATION  LAST SURGERY  APPOX  2008  . ENTEROSCOPY N/A 05/10/2016   Procedure: ENTEROSCOPY;  Surgeon: Jerene Bears, MD;  Location: [place];  Service: Gastroenterology;  Laterality: N/A;  . ENTEROSCOPY N/A 08/12/2016   Procedure: ENTEROSCOPY;  Surgeon: [name], MD;  Location: [facility] ENDOSCOPY;  Service: Endoscopy;  Laterality: N/A;  . ENTEROSCOPY Left 06/03/2017    Procedure: ENTEROSCOPY;  Surgeon: Virgel Manifold, MD;  Location: [facility] ENDOSCOPY;  Service: Endoscopy;  Laterality: Left;  Procedure date will ultimately depend on when patient is medically optimized before the procedure, pending hemodialysis and blood transfusions etc. Will place on schedule and change depending on clinical status.   . ENTEROSCOPY N/A 06/05/2017   Procedure: ENTEROSCOPY;  Surgeon: Virgel Manifold, MD;  Location: [facility] ENDOSCOPY;  Service: Endoscopy;  Laterality: N/A;  . ENTEROSCOPY N/A 06/15/2017   Procedure: Push ENTEROSCOPY;  Surgeon: [name], MD;  Location: [facility] ENDOSCOPY;  Service: Endoscopy;  Laterality: N/A;  . ESOPHAGOGASTRODUODENOSCOPY N/A 05/07/2015   Procedure: ESOPHAGOGASTRODUODENOSCOPY (EGD);  Surgeon: Hulen Luster, MD;  Location: [facility] ENDOSCOPY;  Service: Endoscopy;  Laterality: N/A;  . ESOPHAGOGASTRODUODENOSCOPY (EGD) WITH PROPOFOL N/A 05/17/2015   Procedure: ESOPHAGOGASTRODUODENOSCOPY (EGD) WITH PROPOFOL;  Surgeon: [name], MD;  Location: [facility] ENDOSCOPY;  Service: Endoscopy;  Laterality: N/A;  . ESOPHAGOGASTRODUODENOSCOPY (EGD) WITH PROPOFOL N/A 01/20/2016   Procedure: ESOPHAGOGASTRODUODENOSCOPY (EGD) WITH PROPOFOL;  Surgeon: [name], MD;  Location: [facility] ENDOSCOPY;  Service: Endoscopy;  Laterality: N/A;  . ESOPHAGOGASTRODUODENOSCOPY (EGD) WITH PROPOFOL N/A 04/17/2016   Procedure: ESOPHAGOGASTRODUODENOSCOPY (EGD) WITH PROPOFOL;  Surgeon: Lin Landsman, MD;  Location: [facility] ENDOSCOPY;  Service: Gastroenterology;  Laterality: N/A;  . ESOPHAGOGASTRODUODENOSCOPY (EGD) WITH PROPOFOL  05/09/2016   Procedure: ESOPHAGOGASTRODUODENOSCOPY (EGD) WITH PROPOFOL;  Surgeon: Jerene Bears, MD;  Location: [place];  Service: Endoscopy;;  . ESOPHAGOGASTRODUODENOSCOPY (EGD) WITH PROPOFOL N/A 06/16/2016   Procedure: ESOPHAGOGASTRODUODENOSCOPY (EGD) WITH PROPOFOL;  Surgeon: [name], MD;  Location: [facility] ENDOSCOPY;  Service: Endoscopy;  Laterality: N/A;  .  ESOPHAGOGASTRODUODENOSCOPY (EGD) WITH PROPOFOL N/A 05/05/2017   Procedure: ESOPHAGOGASTRODUODENOSCOPY (EGD) WITH PROPOFOL;  Surgeon: Manya Silvas, MD;  Location: [facility] ENDOSCOPY;  Service: Endoscopy;  Laterality: N/A;  . ESOPHAGOGASTRODUODENOSCOPY (EGD) WITH PROPOFOL N/A 06/15/2017   Procedure: ESOPHAGOGASTRODUODENOSCOPY (EGD) WITH PROPOFOL;  Surgeon: [name], MD;  Location: [facility] ENDOSCOPY;  Service: Endoscopy;  Laterality: N/A;  . GIVENS CAPSULE STUDY N/A 05/07/2016   Procedure: GIVENS CAPSULE STUDY;  Surgeon: Doran Stabler, MD;  Location: [place];  Service: Endoscopy;  Laterality: N/A;  . MANDIBULAR HARDWARE REMOVAL N/A 07/29/2013   Procedure: REMOVAL OF ARCH BARS;  Surgeon: Theodoro Kos, DO;  Location: [place];  Service: Plastics;  Laterality: N/A;  . ORIF MANDIBULAR FRACTURE N/A 06/05/2013   Procedure: REPAIR OF MANDIBULAR FRACTURE x 2 with maxillo-mandibular fixation ;  Surgeon: Theodoro Kos, DO;  Location: [place];  Service: Plastics;  Laterality: N/A;  . PARACENTESIS    . PERIPHERAL ARTERIAL STENT GRAFT Left     SOCIAL HISTORY:  Social History   Tobacco Use  . Smoking status: Current Every Day Smoker    Packs/day: 0.15    Years: 40.00    Pack years: 6.00    Types: Cigarettes  . Smokeless tobacco: Never Used  Substance Use Topics  . Alcohol use: No    Comment: pt reports quitting after  learning about cirrhosis    FAMILY HISTORY:  Family History  Problem Relation Age of Onset  . Colon cancer Mother   . Cancer Father   . Cancer Sister   . Kidney disease Brother     DRUG ALLERGIES: No Known Allergies  REVIEW OF SYSTEMS:   CONSTITUTIONAL: No fever, but positive for fatigue and generalized weakness.  EYES: No changes in vision.  EARS, NOSE, AND THROAT: No tinnitus or ear pain.  RESPIRATORY: Positive for shortness of breath.  No cough, wheezing or hemoptysis.  CARDIOVASCULAR: No chest pain, orthopnea, edema.  GASTROINTESTINAL: No nausea,  vomiting, diarrhea or abdominal pain.  GENITOURINARY: No dysuria, hematuria.  ENDOCRINE: No polyuria, nocturia. HEMATOLOGY: No bleeding. SKIN: No rash or lesion. MUSCULOSKELETAL: No joint pain at this time.   NEUROLOGIC: No focal weakness.  PSYCHIATRY: No anxiety or depression.   MEDICATIONS AT HOME:  Prior to Admission medications   Medication Sig Start Date End Date Taking? Authorizing Provider  albuterol (PROVENTIL HFA;VENTOLIN HFA) 108 (90 Base) MCG/ACT inhaler Inhale 2 puffs into the lungs every 6 (six) hours as needed for wheezing or shortness of breath. 04/21/17  Yes Lisa Roca, MD  B Complex-C-Zn-Folic Acid (DIALYVITE/ZINC) TABS Take 1 tablet by mouth daily. 09/08/17  Yes [provider]  budesonide-formoterol (SYMBICORT) 160-4.5 MCG/ACT inhaler Inhale 2 puffs into the lungs daily. 11/16/16  Yes Vaughan Basta, MD  calcium acetate (PHOSLO) 667 MG capsule Take 2,001 mg by mouth 3 (three) times daily.   Yes [provider]  ferrous sulfate 325 (65 FE) MG tablet Take 1 tablet by mouth 3 (three) times daily. 01/21/17  Yes [provider]  gabapentin (NEURONTIN) 300 MG capsule Take 300 mg by mouth 2 (two) times daily.  08/16/16  Yes [provider]  ipratropium-albuterol (DUONEB) 0.5-2.5 (3) MG/3ML SOLN Take 3 mLs by nebulization every 6 (six) hours as needed. 11/28/16  Yes Wieting, Richard, MD  labetalol (NORMODYNE) 100 MG tablet Take 100 mg by mouth daily. 03/24/17  Yes [provider]  nitroGLYCERIN (NITROSTAT) 0.4 MG SL tablet Place 1 tablet (0.4 mg total) under the tongue every 5 (five) minutes as needed. 04/12/16  Yes Wende Bushy, MD  tiotropium (SPIRIVA HANDIHALER) 18 MCG inhalation capsule Place 1 capsule (18 mcg total) into inhaler and inhale daily. 11/16/16 11/16/17 Yes Vaughan Basta, MD  alum & mag hydroxide-simeth (MAALOX/MYLANTA) 200-200-20 MG/5ML suspension Take 15 mLs every 4 (four) hours as needed by mouth for indigestion  or heartburn. Patient not taking: Reported on 04/19/2017 01/25/17   Demetrios Loll, MD  atorvastatin (LIPITOR) 40 MG tablet Take 1 tablet (40 mg total) by mouth daily. Patient not taking: Reported on 10/20/2017 11/16/16   Vaughan Basta, MD  doxycycline (VIBRA-TABS) 100 MG tablet Take 1 tablet (100 mg total) by mouth 2 (two) times daily. Patient not taking: Reported on 08/28/2017 07/10/17   Harvest Dark, MD  furosemide (LASIX) 80 MG tablet Take 1 tablet (80 mg total) daily by mouth. Patient not taking: Reported on 10/20/2017 01/25/17   Demetrios Loll, MD  Multiple Vitamins-Minerals-FA (DIALYVITE SUPREME D) 3 MG TABS Take 1 tablet (3 mg total) by mouth daily. Patient not taking: Reported on 10/20/2017 11/28/16   Loletha Grayer, MD  omeprazole (PRILOSEC) 40 MG capsule Take 40 mg by mouth daily. 09/08/17   [provider]  pantoprazole (PROTONIX) 40 MG tablet Take 1 tablet (40 mg total) by mouth daily. Patient not taking: Reported on 10/20/2017 11/28/16   Loletha Grayer, MD  Spacer/Aero Chamber Mouthpiece  MISC 1 Units by Does not apply route every 4 (four) hours as needed (wheezing). 02/19/17   Darel Hong, MD      PHYSICAL EXAMINATION:   VITAL SIGNS: Blood pressure (!) 145/92, pulse 88, temperature 99.4 F (37.4 C), temperature source Oral, resp. rate (!) 25, height 6\' 3"  (1.905 m), weight 86.8 kg, SpO2 96 %.  GENERAL:  57 y.o.-year-old patient lying in the bed with no acute distress, while at rest with her head at the bed elevated at 60 degrees.Marland Kitchen  EYES: Pupils equal, round, reactive to light and accommodation. No scleral icterus. Extraocular muscles intact.  HEENT: Head atraumatic, normocephalic. Oropharynx and nasopharynx clear.  NECK:  Supple, no jugular venous distention. No thyroid enlargement, no tenderness.  LUNGS: Reduced breath sounds and crackles are noted bilaterally. No use of accessory muscles of respiration.  CARDIOVASCULAR: S1, S2 normal. No S3/S4.  ABDOMEN: Soft,  nontender, but significantly distended. Bowel sounds present.   EXTREMITIES: No pedal edema, cyanosis, or clubbing.  NEUROLOGIC: Cranial nerves II through XII are intact. Muscle strength 5/5 in all extremities. Sensation intact.   PSYCHIATRIC: The patient is alert and oriented x 3.  SKIN: No obvious rash, lesion, or ulcer.   LABORATORY PANEL:   CBC Recent Labs  Lab 10/25/17 1406 10/29/17 1859  WBC 9.3 7.9  HGB 9.3* 9.2*  HCT 30.4* 29.2*  PLT 356 321  MCV 74.5* 72.6*  MCH 22.7* 23.0*  MCHC 30.4* 31.6*  RDW 20.4* 19.5*  LYMPHSABS  --  0.9*  MONOABS  --  1.0  EOSABS  --  0.2  BASOSABS  --  0.1   ------------------------------------------------------------------------------------------------------------------  Chemistries  Recent Labs  Lab 10/25/17 1146 10/25/17 1406 10/29/17 1859  NA 143 142 141  K 6.3* 6.2* 5.7*  CL 99 97* 101  CO2 30 30 27   GLUCOSE 115* 91 110*  BUN 53* 53* 33*  CREATININE 13.32* 13.06* 9.14*  CALCIUM 8.1* 8.0* 8.1*   ------------------------------------------------------------------------------------------------------------------ estimated creatinine clearance is 10.5 mL/min (A) (by C-G formula based on SCr of 9.14 mg/dL (H)). ------------------------------------------------------------------------------------------------------------------ No results for input(s): TSH, T4TOTAL, T3FREE, THYROIDAB in the last 72 hours.  Invalid input(s): FREET3   Coagulation profile No results for input(s): INR, PROTIME in the last 168 hours. ------------------------------------------------------------------------------------------------------------------- No results for input(s): DDIMER in the last 72 hours. -------------------------------------------------------------------------------------------------------------------  Cardiac Enzymes Recent Labs  Lab 10/29/17 1859  TROPONINI 0.07*    ------------------------------------------------------------------------------------------------------------------ Invalid input(s): POCBNP  ---------------------------------------------------------------------------------------------------------------  Urinalysis    Component Value Date/Time   COLORURINE YELLOW (A) 05/03/2015 0815   APPEARANCEUR CLEAR (A) 05/03/2015 0815   APPEARANCEUR Clear 11/13/2013 1314   LABSPEC 1.008 05/03/2015 0815   LABSPEC 1.014 11/13/2013 1314   PHURINE 9.0 (H) 05/03/2015 0815   GLUCOSEU NEGATIVE 05/03/2015 0815   GLUCOSEU 50 mg/dL 11/13/2013 1314   HGBUR NEGATIVE 05/03/2015 0815   BILIRUBINUR NEGATIVE 05/03/2015 0815   BILIRUBINUR Negative 11/13/2013 1314   KETONESUR NEGATIVE 05/03/2015 0815   PROTEINUR 100 (A) 05/03/2015 0815   UROBILINOGEN 0.2 06/22/2014 1230   NITRITE NEGATIVE 05/03/2015 0815   LEUKOCYTESUR NEGATIVE 05/03/2015 0815   LEUKOCYTESUR Negative 11/13/2013 1314     RADIOLOGY: Dg Chest Portable 1 View  Result Date: 10/29/2017 CLINICAL DATA:  Cramping and ascites. Shortness of breath. History of end-stage renal disease on dialysis, CHF. EXAM: PORTABLE CHEST 1 VIEW COMPARISON:  Chest radiograph September 25, 2017 FINDINGS: Stable cardiomegaly. Pulmonary vascular congestion mild interstitial prominence without pleural effusion or focal consolidation. Calcified aortic arch. Bibasilar atelectasis/scarring. Status post median  sternotomy for CABG. Tunneled dialysis catheter via RIGHT internal jugular venous approach with distal tip projecting in distal superior vena cava. No pneumothorax. Soft tissue planes and included osseous structures are non suspicious. IMPRESSION: 1. Stable cardiomegaly. 2. Similar interstitial prominence compatible with pulmonary edema. 3.  Aortic Atherosclerosis (ICD10-I70.0). Electronically Signed   By: Elon Alas M.D.   On: 10/29/2017 19:48    EKG: Orders placed or performed during the hospital encounter of 10/29/17   . ED EKG  . ED EKG  . EKG 12-Lead  . EKG 12-Lead    IMPRESSION AND PLAN:  1.  Acute respiratory distress, multifactorial, secondary to pulmonary edema, large ascites and chronic anemia.  We will schedule patient for dialysis in the morning.  If no significant improvement, he might require another paracentesis, as well.  We will give Lasix IV.  Continue supportive measures with oxygen therapy as needed and morphine IV.  2.  Acute pulmonary edema, will start treatment with Lasix IV and oxygen therapy.  We will schedule him for hemodialysis in the morning.  We will check 2D echo to further evaluate the heart function.  3.  End-stage renal disease.  Continue hemodialysis treatment per nephrology.  4.  Anemia of chronic diseases and iron deficiency.  Hemoglobin level is stable at 9.2.  No active bleeding.  Continue treatment with iron and EPO shots, as needed per nephrology we will continue to monitor hemoglobin level closely and transfuse PRBC, as needed for hemoglobin level lower than 7.   5.  Elevated troponin level at 0.07.  This seems to be the baseline for the patient and is likely related to kidney failure.  We will continue to monitor patient on telemetry and follow troponin levels to rule out ACS.  Will check 2D echo for further evaluation of the heart function.  6.  Alcoholic liver cirrhosis, with large ascites, requiring recurrent paracentesis.  We will continue treatment per gastroenterology.  7.  Tobacco abuse.  Smoking cessation was discussed with patient in detail.   All the records are reviewed and case discussed with ED provider. Management plans discussed with the patient, who is in agreement.  CODE STATUS: Full Code Status History    Date Active Date Inactive Code Status Order ID Comments User Context   06/12/2017 1707 06/16/2017 1704 Full Code [id number]  Nicholes Mango, MD Inpatient   06/02/2017 1603 06/06/2017 2222 Full Code [id number]  Demetrios Loll, MD Inpatient   05/24/2017  1831 05/25/2017 1921 Full Code [id number]  Saundra Shelling, MD ED   05/03/2017 1331 05/05/2017 1806 Full Code [id number]  Bettey Costa, MD Inpatient   04/01/2017 0947 04/03/2017 1156 Full Code [id number]  Loletha Grayer, MD ED   03/09/2017 0010 03/09/2017 1928 Full Code [id number]  Lance Coon, MD Inpatient   03/03/2017 2027 03/04/2017 1920 Full Code [id number]  Demetrios Loll, MD Inpatient   02/28/2017 2254 03/02/2017 1519 Full Code [id number]  Lance Coon, MD Inpatient   02/22/2017 1721 02/24/2017 1359 Full Code [id number]  Dustin Flock, MD Inpatient   01/22/2017 2337 01/25/2017 1522 Full Code [id number]  Gorden Harms, MD Inpatient   01/07/2017 1445 01/13/2017 1812 Full Code [id number]  Idelle Crouch, MD Inpatient   12/22/2016 1431 12/24/2016 1804 Full Code [id number]  Hillary Bow, MD ED   12/20/2016 2020 12/21/2016 1853 Full Code [id number]  Demetrios Loll, MD Inpatient   11/26/2016 1030 11/28/2016 1522 Full Code [id number]  Henreitta Leber, MD Inpatient   11/15/2016 1930 11/16/2016 1822 Full Code [id number]  Fritzi Mandes, MD Inpatient   10/29/2016 1453 10/30/2016 1506 Full Code [id number]  Loletha Grayer, MD ED   09/15/2016 1757 09/16/2016 1817 Full Code [id number]  Dustin Flock, MD ED   08/16/2016 0945 08/18/2016 1816 Full Code [id number]  Hillary Bow, MD ED   08/09/2016 1659 08/12/2016 2034 Full Code [id number]  Fritzi Mandes, MD Inpatient   07/03/2016 2154 07/05/2016 2122 Full Code [id number]  Demetrios Loll, MD Inpatient   06/24/2016 0423 06/26/2016 1546 Full Code [id number]  Saundra Shelling, MD Inpatient   06/14/2016 1453 06/19/2016 2104 Full Code [id number]  Hillary Bow, MD ED   05/31/2016 0642 06/01/2016 2130 Full Code [id number]  Saundra Shelling, MD Inpatient   05/05/2016 2309 05/10/2016 2157 Full Code [id number]  Vianne Bulls, MD Inpatient   04/16/2016 0356 04/17/2016 1827 Full Code [id number]  Saundra Shelling, MD Inpatient   01/19/2016 1513 01/22/2016 1414 Full Code [id number]  Loletha Grayer, MD ED   09/24/2015 1033 09/25/2015  1331 Full Code [id number]  Loletha Grayer, MD ED   05/16/2015 1417 05/17/2015 2057 Full Code [id number]  Idelle Crouch, MD Inpatient   04/30/2015 2008 05/08/2015 1940 Full Code [id number]  Vaughan Basta, MD Inpatient   04/06/2015 0507 04/07/2015 1736 Full Code [id number]  Lance Coon, MD Inpatient   03/23/2015 1107 03/27/2015 1246 Full Code [id number]  Bettey Costa, MD Inpatient   06/19/2013 1132 06/21/2013 1749 Full Code [id number]  Cristal Ford, DO Inpatient   06/16/2013 1726 06/19/2013 1132 Full Code [id number]  Margarita Mail, PA-C ED   06/05/2013 1533 06/06/2013 1856 Full Code [id number]  Theodoro Kos, DO Inpatient   06/02/2013 0901 06/05/2013 1533 Full Code [id number]  Velvet Bathe, MD Inpatient   06/02/2013 0840 06/02/2013 0901 Full Code [id number]  Velvet Bathe, MD Inpatient       TOTAL TIME TAKING CARE OF THIS PATIENT: 45 minutes.    Amelia Jo M.D on 10/29/2017 at 10:25 PM  Between 7am to 6pm - Pager - [phone]  After 6pm go to [email or web address] - password [email or web address] [facility] [place] [facility]  [phone]  CC: Primary care physician; Manya Silvas, MD

## 2017-10-29 NOTE — ED Triage Notes (Signed)
Pt presents today via [facility] form home for cramping and ascites. Pt received dialysis yesterday, pt was shorted 1.5 hours.

## 2017-10-29 NOTE — ED Provider Notes (Signed)
[facility] Emergency Department Provider Note  ____________________________________________   First MD Initiated Contact with Patient 10/29/17 1934     (approximate)  I have reviewed the triage vital signs and the nursing notes.   HISTORY  Chief Complaint Shortness of Breath    HPI Stanley [name] is a 57 y.o. male well-known to the emergency department for dialysis visits on Tuesday, Thursday, and Saturday.  He has a history of alcohol abuse, CHF, and cirrhosis and is required large-volume paracenteses in the past.  He presents today by EMS for evaluation of shortness of breath.  He reports that he had dialysis yesterday but was "shorted" by an hour and a half because he developed some cramping which is common for him but they stop the dialysis early.  His shortness of breath has been gradually getting worse over the last 1 to 2 days.  He thinks that a major part of that is that his abdomen is swollen up severely again even though he had a large volume paracentesis about a week ago.  He has increased difficulty breathing when he lies down flat and nothing in particular makes it better.  Exertion also makes his symptoms worse.  He has no pain in his abdomen but says it feels very tight.  He denies chest pain, nausea, vomiting, and in fact he is asking for something to eat.  He reports chronic pain in both of his lower legs.  He does not make more than a very trace amount of urine and has had no dysuria.  He denies fever/chills.  He does not regularly use oxygen but he was put on oxygen for comfort by EMS.  Past Medical History:  Diagnosis Date  . Alcohol abuse   . CHF (congestive heart failure) ([place])   . Cirrhosis ([place])   . Coronary artery disease 2009  . Drug abuse ([place])   . End stage renal disease on dialysis [facility]) NEPHROLOGIST-   DR [facility]  IN [place]   HEMODIALYSIS --   TUES/  THURS/  SAT  . Gastrointestinal bleed 06/13/2017   From chart...hx of  multiple GI bleeds  . GERD (gastroesophageal reflux disease)   . Hyperlipidemia   . Hypertension   . PAD (peripheral artery disease) ([place])   . Renal insufficiency    Per pt, 32 oz fluid restriction per day  . S/P triple vessel bypass 06/09/2016   2009ish  . Suicidal ideation    & HOMICIDAL IDEATION --  06-16-2013   ADMITTED TO BEHAVIOR HEALTH    Patient Active Problem List   Diagnosis Date Noted  . Acute respiratory failure ([name]) 10/29/2017  . ESRD (end stage renal disease) on dialysis ([place]) 07/28/2017  . Protein-calorie malnutrition, severe 06/14/2017  . Encounter for dialysis [facility])   . Palliative care by specialist   . Goals of care, counseling/discussion   . Malnutrition of moderate degree 06/05/2017  . Secondary esophageal varices without bleeding ([place])   . Stomach irritation   . Idiopathic esophageal varices without bleeding ([place])   . Alcoholic hepatitis with ascites 05/24/2017  . ESRD (end stage renal disease) ([place]) 04/28/2017  . Uremia 03/08/2017  . ESRD on hemodialysis ([place]) 03/03/2017  . Weakness 02/28/2017  . Hypocalcemia 02/22/2017  . Shortness of breath 11/26/2016  . COPD (chronic obstructive pulmonary disease) ([place]) 10/30/2016  . COPD exacerbation ([place]) 10/29/2016  . Anemia   . Heme positive stool   . Ulceration of intestine   . Benign neoplasm of transverse  colon   . Acute gastrointestinal hemorrhage   . Esophageal candidiasis ([place])   . Angiodysplasia of intestinal tract   . Acute respiratory failure with hypoxia ([place]) 07/03/2016  . GI bleeding 06/24/2016  . Rectal bleeding 06/14/2016  . Anemia of chronic disease 06/01/2016  . MRSA carrier 06/01/2016  . Chronic renal failure 05/23/2016  . Ischemic heart disease 05/23/2016  . Angiodysplasia of small intestine   . Melena   . Small bowel bleed not requiring more than 4 units of blood in 24 hours, ICU, or surgery   . Anemia due to chronic blood loss   . Abdominal pain 05/05/2016  . Acute posthemorrhagic  anemia 04/17/2016  . Gastrointestinal bleed 04/17/2016  . History of esophagogastroduodenoscopy (EGD) 04/17/2016  . Elevated troponin 04/17/2016  . Alcohol abuse 04/17/2016  . Upper GI bleed 01/19/2016  . Blood in stool   . Angiodysplasia of stomach and duodenum with hemorrhage   . Gastritis   . Reflux esophagitis   . GI bleed 05/16/2015  . Acute GI bleeding   . Symptomatic anemia 04/30/2015  . HTN (hypertension) 04/06/2015  . GERD (gastroesophageal reflux disease) 04/06/2015  . HLD (hyperlipidemia) 04/06/2015  . Dyspnea 04/06/2015  . Cirrhosis of liver with ascites ([place]) 04/06/2015  . Ascites 04/06/2015  . GIB (gastrointestinal bleeding) 03/23/2015  . Homicidal ideation 06/19/2013  . Suicidal intent 06/19/2013  . Homicidal ideations 06/19/2013  . Hyperkalemia 06/16/2013  . Mandible fracture ([place]) 06/05/2013  . Fracture, mandible ([place]) 06/02/2013  . Coronary atherosclerosis of native coronary artery 06/02/2013  . ESRD on dialysis ([place]) 06/02/2013  . Mandible open fracture ([place]) 06/02/2013    Past Surgical History:  Procedure Laterality Date  . A/V FISTULAGRAM Right 06/06/2017   Procedure: A/V FISTULAGRAM;  Surgeon: Katha Cabal, MD;  Location: [place] CV LAB;  Service: Cardiovascular;  Laterality: Right;  . A/V SHUNT INTERVENTION N/A 06/06/2017   Procedure: A/V SHUNT INTERVENTION;  Surgeon: Katha Cabal, MD;  Location: [place] CV LAB;  Service: Cardiovascular;  Laterality: N/A;  . AGILE CAPSULE N/A 06/19/2016   Procedure: AGILE CAPSULE;  Surgeon: [name], MD;  Location: [facility] ENDOSCOPY;  Service: Endoscopy;  Laterality: N/A;  . COLONOSCOPY WITH PROPOFOL N/A 06/18/2016   Procedure: COLONOSCOPY WITH PROPOFOL;  Surgeon: [name], MD;  Location: [facility] ENDOSCOPY;  Service: Endoscopy;  Laterality: N/A;  . COLONOSCOPY WITH PROPOFOL N/A 08/12/2016   Procedure: COLONOSCOPY WITH PROPOFOL;  Surgeon: [name], MD;  Location: [facility] ENDOSCOPY;  Service: Endoscopy;   Laterality: N/A;  . COLONOSCOPY WITH PROPOFOL N/A 05/05/2017   Procedure: COLONOSCOPY WITH PROPOFOL;  Surgeon: Manya Silvas, MD;  Location: [facility] ENDOSCOPY;  Service: Endoscopy;  Laterality: N/A;  . CORONARY ANGIOPLASTY  ?   PT UNABLE TO TELL IF  BEFORE OR AFTER  CABG  . CORONARY ARTERY BYPASS GRAFT  2008  ([place])   3 VESSEL  . DIALYSIS FISTULA CREATION  LAST SURGERY  APPOX  2008  . ENTEROSCOPY N/A 05/10/2016   Procedure: ENTEROSCOPY;  Surgeon: Jerene Bears, MD;  Location: [place];  Service: Gastroenterology;  Laterality: N/A;  . ENTEROSCOPY N/A 08/12/2016   Procedure: ENTEROSCOPY;  Surgeon: [name], MD;  Location: [facility] ENDOSCOPY;  Service: Endoscopy;  Laterality: N/A;  . ENTEROSCOPY Left 06/03/2017   Procedure: ENTEROSCOPY;  Surgeon: Virgel Manifold, MD;  Location: [facility] ENDOSCOPY;  Service: Endoscopy;  Laterality: Left;  Procedure date will ultimately depend on when patient is medically optimized before the procedure, pending hemodialysis and  blood transfusions etc. Will place on schedule and change depending on clinical status.   . ENTEROSCOPY N/A 06/05/2017   Procedure: ENTEROSCOPY;  Surgeon: Virgel Manifold, MD;  Location: [facility] ENDOSCOPY;  Service: Endoscopy;  Laterality: N/A;  . ENTEROSCOPY N/A 06/15/2017   Procedure: Push ENTEROSCOPY;  Surgeon: [name], MD;  Location: [facility] ENDOSCOPY;  Service: Endoscopy;  Laterality: N/A;  . ESOPHAGOGASTRODUODENOSCOPY N/A 05/07/2015   Procedure: ESOPHAGOGASTRODUODENOSCOPY (EGD);  Surgeon: Hulen Luster, MD;  Location: [facility] ENDOSCOPY;  Service: Endoscopy;  Laterality: N/A;  . ESOPHAGOGASTRODUODENOSCOPY (EGD) WITH PROPOFOL N/A 05/17/2015   Procedure: ESOPHAGOGASTRODUODENOSCOPY (EGD) WITH PROPOFOL;  Surgeon: [name], MD;  Location: [facility] ENDOSCOPY;  Service: Endoscopy;  Laterality: N/A;  . ESOPHAGOGASTRODUODENOSCOPY (EGD) WITH PROPOFOL N/A 01/20/2016   Procedure: ESOPHAGOGASTRODUODENOSCOPY (EGD) WITH PROPOFOL;  Surgeon: [name],  MD;  Location: [facility] ENDOSCOPY;  Service: Endoscopy;  Laterality: N/A;  . ESOPHAGOGASTRODUODENOSCOPY (EGD) WITH PROPOFOL N/A 04/17/2016   Procedure: ESOPHAGOGASTRODUODENOSCOPY (EGD) WITH PROPOFOL;  Surgeon: Lin Landsman, MD;  Location: [facility] ENDOSCOPY;  Service: Gastroenterology;  Laterality: N/A;  . ESOPHAGOGASTRODUODENOSCOPY (EGD) WITH PROPOFOL  05/09/2016   Procedure: ESOPHAGOGASTRODUODENOSCOPY (EGD) WITH PROPOFOL;  Surgeon: Jerene Bears, MD;  Location: [place];  Service: Endoscopy;;  . ESOPHAGOGASTRODUODENOSCOPY (EGD) WITH PROPOFOL N/A 06/16/2016   Procedure: ESOPHAGOGASTRODUODENOSCOPY (EGD) WITH PROPOFOL;  Surgeon: [name], MD;  Location: [facility] ENDOSCOPY;  Service: Endoscopy;  Laterality: N/A;  . ESOPHAGOGASTRODUODENOSCOPY (EGD) WITH PROPOFOL N/A 05/05/2017   Procedure: ESOPHAGOGASTRODUODENOSCOPY (EGD) WITH PROPOFOL;  Surgeon: Manya Silvas, MD;  Location: [facility] ENDOSCOPY;  Service: Endoscopy;  Laterality: N/A;  . ESOPHAGOGASTRODUODENOSCOPY (EGD) WITH PROPOFOL N/A 06/15/2017   Procedure: ESOPHAGOGASTRODUODENOSCOPY (EGD) WITH PROPOFOL;  Surgeon: [name], MD;  Location: [facility] ENDOSCOPY;  Service: Endoscopy;  Laterality: N/A;  . GIVENS CAPSULE STUDY N/A 05/07/2016   Procedure: GIVENS CAPSULE STUDY;  Surgeon: Doran Stabler, MD;  Location: [place];  Service: Endoscopy;  Laterality: N/A;  . MANDIBULAR HARDWARE REMOVAL N/A 07/29/2013   Procedure: REMOVAL OF ARCH BARS;  Surgeon: Theodoro Kos, DO;  Location: [place];  Service: Plastics;  Laterality: N/A;  . ORIF MANDIBULAR FRACTURE N/A 06/05/2013   Procedure: REPAIR OF MANDIBULAR FRACTURE x 2 with maxillo-mandibular fixation ;  Surgeon: Theodoro Kos, DO;  Location: [place];  Service: Plastics;  Laterality: N/A;  . PARACENTESIS    . PERIPHERAL ARTERIAL STENT GRAFT Left     Prior to Admission medications   Medication Sig Start Date End Date Taking? Authorizing Provider  albuterol (PROVENTIL HFA;VENTOLIN HFA) 108  (90 Base) MCG/ACT inhaler Inhale 2 puffs into the lungs every 6 (six) hours as needed for wheezing or shortness of breath. 04/21/17  Yes Lisa Roca, MD  B Complex-C-Zn-Folic Acid (DIALYVITE/ZINC) TABS Take 1 tablet by mouth daily. 09/08/17  Yes [provider]  budesonide-formoterol (SYMBICORT) 160-4.5 MCG/ACT inhaler Inhale 2 puffs into the lungs daily. 11/16/16  Yes Vaughan Basta, MD  calcium acetate (PHOSLO) 667 MG capsule Take 2,001 mg by mouth 3 (three) times daily.   Yes [provider]  ferrous sulfate 325 (65 FE) MG tablet Take 1 tablet by mouth 3 (three) times daily. 01/21/17  Yes [provider]  gabapentin (NEURONTIN) 300 MG capsule Take 300 mg by mouth 2 (two) times daily.  08/16/16  Yes [provider]  ipratropium-albuterol (DUONEB) 0.5-2.5 (3) MG/3ML SOLN Take 3 mLs by nebulization every 6 (six) hours as needed. 11/28/16  Yes Wieting, Richard, MD  labetalol (NORMODYNE) 100 MG tablet Take 100 mg  by mouth daily. 03/24/17  Yes [provider]  nitroGLYCERIN (NITROSTAT) 0.4 MG SL tablet Place 1 tablet (0.4 mg total) under the tongue every 5 (five) minutes as needed. 04/12/16  Yes Wende Bushy, MD  tiotropium (SPIRIVA HANDIHALER) 18 MCG inhalation capsule Place 1 capsule (18 mcg total) into inhaler and inhale daily. 11/16/16 11/16/17 Yes Vaughan Basta, MD  alum & mag hydroxide-simeth (MAALOX/MYLANTA) 200-200-20 MG/5ML suspension Take 15 mLs every 4 (four) hours as needed by mouth for indigestion or heartburn. Patient not taking: Reported on 04/19/2017 01/25/17   Demetrios Loll, MD  atorvastatin (LIPITOR) 40 MG tablet Take 1 tablet (40 mg total) by mouth daily. Patient not taking: Reported on 10/20/2017 11/16/16   Vaughan Basta, MD  doxycycline (VIBRA-TABS) 100 MG tablet Take 1 tablet (100 mg total) by mouth 2 (two) times daily. Patient not taking: Reported on 08/28/2017 07/10/17   Harvest Dark, MD  furosemide (LASIX) 80 MG tablet Take  1 tablet (80 mg total) daily by mouth. Patient not taking: Reported on 10/20/2017 01/25/17   Demetrios Loll, MD  Multiple Vitamins-Minerals-FA (DIALYVITE SUPREME D) 3 MG TABS Take 1 tablet (3 mg total) by mouth daily. Patient not taking: Reported on 10/20/2017 11/28/16   Loletha Grayer, MD  omeprazole (PRILOSEC) 40 MG capsule Take 40 mg by mouth daily. 09/08/17   [provider]  pantoprazole (PROTONIX) 40 MG tablet Take 1 tablet (40 mg total) by mouth daily. Patient not taking: Reported on 10/20/2017 11/28/16   Loletha Grayer, MD  Spacer/Aero Chamber Mouthpiece MISC 1 Units by Does not apply route every 4 (four) hours as needed (wheezing). 02/19/17   Darel Hong, MD    Allergies Patient has no known allergies.  Family History  Problem Relation Age of Onset  . Colon cancer Mother   . Cancer Father   . Cancer Sister   . Kidney disease Brother     Social History Social History   Tobacco Use  . Smoking status: Current Every Day Smoker    Packs/day: 0.15    Years: 40.00    Pack years: 6.00    Types: Cigarettes  . Smokeless tobacco: Never Used  Substance Use Topics  . Alcohol use: No    Comment: pt reports quitting after learning about cirrhosis  . Drug use: No    Frequency: 7.0 times per week    Types: Marijuana, Cocaine    Review of Systems Constitutional: No fever/chills Eyes: No visual changes. ENT: No sore throat. Cardiovascular: Denies chest pain. Respiratory: Shortness of breath as described above. Gastrointestinal: Abdominal distention as described above but no abdominal pain.  No nausea, no vomiting.  No diarrhea.  No constipation. Genitourinary: Negative for dysuria but minimal urine production at baseline  musculoskeletal: Negative for neck pain.  Negative for back pain. Integumentary: Negative for rash. Neurological: Negative for headaches, focal weakness or numbness.   ____________________________________________   PHYSICAL EXAM:  VITAL SIGNS: ED  Triage Vitals  Enc Vitals Group     BP 10/29/17 1859 122/79     Pulse Rate 10/29/17 1859 92     Resp --      Temp 10/29/17 1859 99.4 F (37.4 C)     Temp Source 10/29/17 1859 Oral     SpO2 10/29/17 1859 97 %     Weight 10/29/17 1900 86.8 kg (191 lb 5.8 oz)     Height 10/29/17 1900 1.905 m (6\' 3" )     Head Circumference --      Peak Flow --  Pain Score 10/29/17 1859 3     Pain Loc --      Pain Edu? --      Excl. in [place]? --     Constitutional: Alert and oriented.  Mild respiratory distress and looks uncomfortable Eyes: Conjunctivae are normal.  Head: Atraumatic. Nose: No congestion/rhinnorhea. Mouth/Throat: Mucous membranes are moist. Neck: No stridor.  No meningeal signs.   Cardiovascular: Normal rate, regular rhythm. Good peripheral circulation. Grossly normal heart sounds. Respiratory: Mildly increased work of breathing with some intercostal retractions and accessory muscle usage. Gastrointestinal: Tense abdomen with significant distention and ascites but with only very mild tenderness to palpation throughout. Musculoskeletal: No lower extremity tenderness nor edema. No gross deformities of extremities. Neurologic:  Normal speech and language. No gross focal neurologic deficits are appreciated.  Skin:  Skin is warm, dry and intact. No rash noted. Psychiatric: Mood and affect are normal. Speech and behavior are normal.  ____________________________________________   LABS (all labs ordered are listed, but only abnormal results are displayed)  Labs Reviewed  CBC WITH DIFFERENTIAL/PLATELET - Abnormal; Notable for the following components:      Result Value   RBC 4.02 (*)    Hemoglobin 9.2 (*)    HCT 29.2 (*)    MCV 72.6 (*)    MCH 23.0 (*)    MCHC 31.6 (*)    RDW 19.5 (*)    Lymphs Abs 0.9 (*)    All other components within normal limits  BASIC METABOLIC PANEL - Abnormal; Notable for the following components:   Potassium 5.7 (*)    Glucose, Bld 110 (*)    BUN 33  (*)    Creatinine, Ser 9.14 (*)    Calcium 8.1 (*)    GFR calc non Af Amer 6 (*)    GFR calc Af Amer 6 (*)    All other components within normal limits  BRAIN NATRIURETIC PEPTIDE - Abnormal; Notable for the following components:   B Natriuretic Peptide 3,076.0 (*)    All other components within normal limits  TROPONIN I - Abnormal; Notable for the following components:   Troponin I 0.07 (*)    All other components within normal limits  MRSA PCR SCREENING  BASIC METABOLIC PANEL  CBC   ____________________________________________  EKG  ED ECG REPORT I, Hinda Kehr, the attending physician, personally viewed and interpreted this ECG.  Date: 10/29/2017 EKG Time: 18: 58 Rate: 89 Rhythm: normal sinus rhythm QRS Axis: normal Intervals: normal ST/T Wave abnormalities: Non-specific ST segment / T-wave changes, but no evidence of acute ischemia. Narrative Interpretation: no evidence of acute ischemia   ____________________________________________  RADIOLOGY I, Hinda Kehr, personally viewed and evaluated these images (plain radiographs) as part of my medical decision making, as well as reviewing the written report by the radiologist.  ED MD interpretation: Pulmonary edema similar to prior  Official radiology report(s): Dg Chest Portable 1 View  Result Date: 10/29/2017 CLINICAL DATA:  Cramping and ascites. Shortness of breath. History of end-stage renal disease on dialysis, CHF. EXAM: PORTABLE CHEST 1 VIEW COMPARISON:  Chest radiograph September 25, 2017 FINDINGS: Stable cardiomegaly. Pulmonary vascular congestion mild interstitial prominence without pleural effusion or focal consolidation. Calcified aortic arch. Bibasilar atelectasis/scarring. Status post median sternotomy for CABG. Tunneled dialysis catheter via RIGHT internal jugular venous approach with distal tip projecting in distal superior vena cava. No pneumothorax. Soft tissue planes and included osseous structures are non  suspicious. IMPRESSION: 1. Stable cardiomegaly. 2. Similar interstitial prominence compatible with pulmonary edema.  3.  Aortic Atherosclerosis (ICD10-I70.0). Electronically Signed   By: Elon Alas M.D.   On: 10/29/2017 19:48    ____________________________________________   PROCEDURES  Critical Care performed: No   Procedure(s) performed:   Procedures   ____________________________________________   INITIAL IMPRESSION / ASSESSMENT AND PLAN / ED COURSE  As part of my medical decision making, I reviewed the following data within the [place] notes reviewed and incorporated, Labs reviewed , EKG interpreted  Radiograph reviewed , Discussed with admitting physician  and Notes from prior ED visits    Differential diagnosis includes, but is not limited to, worsening pulmonary edema compared to prior secondary to having a short dialysis session yesterday, increased work of breathing secondary to his rapidly worsening ascites, pneumonia, PE, pneumothorax, pleural effusion, pericardial effusion, SBP.  Fortunately the patient is only mild respiratory distress which I think is likely as much secondary to the ascites as it is to his pulmonary edema.  However he is having difficulty breathing with increased orthopnea and pulmonary edema on chest x-ray.  His CBC is stable for him.  BNP is over 3000 which is more or less common for him.  Troponin is elevated at 0.07 which is likely due to his chronic kidney disease and perhaps some demand ischemia.  Basic metabolic panel is delayed secondary to a lab analyzer failure, so I am still waiting electrolytes.  The patient will need to be admitted for dialysis and large volume paracentesis to improve his work of breathing although I do not believe he needs urgent dialysis tonight.  He agrees with the plan.  Awaiting metabolic panel and then will discuss with hospitalist.  Clinical Course as of Oct 31 30  Sun Oct 29, 2017    1829 Basic metabolic panel is notable for a potassium of 5.7 which will be corrected with dialysis, otherwise no indication for any acute interventions..  I discussed the case with Dr. Duane Boston with the hospitalist service and she will admit.  Basic metabolic panel(!) [CF]    Clinical Course User Index [CF] Hinda Kehr, MD    ____________________________________________  FINAL CLINICAL IMPRESSION(S) / ED DIAGNOSES  Final diagnoses:  Dyspnea, unspecified type  Chronic pulmonary edema  Ascites due to alcoholic cirrhosis ([place])  Elevated troponin I level  ESRD on hemodialysis ([place])  Hyperkalemia     MEDICATIONS GIVEN DURING THIS VISIT:  Medications  labetalol (NORMODYNE) tablet 100 mg (has no administration in time range)  calcium acetate (PHOSLO) capsule 2,001 mg (has no administration in time range)  pantoprazole (PROTONIX) EC tablet 40 mg (has no administration in time range)  ferrous sulfate tablet 325 mg (has no administration in time range)  gabapentin (NEURONTIN) capsule 300 mg (has no administration in time range)  DIALYVITE/ZINC TABS 1 tablet (has no administration in time range)  albuterol (PROVENTIL HFA;VENTOLIN HFA) 108 (90 Base) MCG/ACT inhaler 2 puff (has no administration in time range)  fluticasone furoate-vilanterol (BREO ELLIPTA) 200-25 MCG/INH 1 puff (has no administration in time range)  ipratropium-albuterol (DUONEB) 0.5-2.5 (3) MG/3ML nebulizer solution 3 mL (has no administration in time range)  tiotropium (SPIRIVA) inhalation capsule 18 mcg (has no administration in time range)  heparin injection 5,000 Units (has no administration in time range)  acetaminophen (TYLENOL) tablet 650 mg (has no administration in time range)    Or  acetaminophen (TYLENOL) suppository 650 mg (has no administration in time range)  HYDROcodone-acetaminophen (NORCO/VICODIN) 5-325 MG per tablet 1-2 tablet (has no administration in time range)  docusate sodium (COLACE) capsule 100 mg  (has no administration in time range)  bisacodyl (DULCOLAX) EC tablet 5 mg (has no administration in time range)  ondansetron (ZOFRAN) tablet 4 mg (has no administration in time range)    Or  ondansetron (ZOFRAN) injection 4 mg (has no administration in time range)  LORazepam (ATIVAN) tablet 1 mg (has no administration in time range)    Or  LORazepam (ATIVAN) injection 1 mg (has no administration in time range)  thiamine (VITAMIN B-1) tablet 100 mg (has no administration in time range)    Or  thiamine (B-1) injection 100 mg (has no administration in time range)  folic acid (FOLVITE) tablet 1 mg (has no administration in time range)  multivitamin with minerals tablet 1 tablet (has no administration in time range)  LORazepam (ATIVAN) injection 0-4 mg (has no administration in time range)    Followed by  LORazepam (ATIVAN) injection 0-4 mg (has no administration in time range)  oxyCODONE-acetaminophen (PERCOCET/ROXICET) 5-325 MG per tablet 2 tablet (2 tablets Oral Given 10/29/17 2152)  diphenhydrAMINE (BENADRYL) injection 12.5 mg (12.5 mg Intravenous Given 10/29/17 2150)     ED Discharge Orders    None       Note:  This document was prepared using Dragon voice recognition software and may include unintentional dictation errors.    Hinda Kehr, MD 10/30/17 [phone]

## 2017-10-29 NOTE — ED Notes (Signed)
Attempted to call report to [facility]. No answer. Will reattempt call.

## 2017-10-29 NOTE — ED Notes (Signed)
Patient transported to 90

## 2017-10-29 NOTE — ED Notes (Signed)
Patient given meal tray per MD order 

## 2017-10-29 NOTE — ED Notes (Signed)
Patient c/o left foot pain and generalized itching. MD informed.

## 2017-10-29 NOTE — ED Notes (Signed)
Jenna RN, aware of bed assigned  

## 2017-10-30 ENCOUNTER — Inpatient Hospital Stay: Payer: Medicare Other

## 2017-10-30 ENCOUNTER — Encounter: Payer: Self-pay | Admitting: *Deleted

## 2017-10-30 ENCOUNTER — Other Ambulatory Visit: Payer: Self-pay

## 2017-10-30 ENCOUNTER — Inpatient Hospital Stay (HOSPITAL_COMMUNITY)
Admit: 2017-10-30 | Discharge: 2017-10-30 | Disposition: A | Discharge status: Home / Self Care | Payer: Medicare Other | Attending: Internal Medicine | Admitting: Internal Medicine

## 2017-10-30 DIAGNOSIS — I361 Nonrheumatic tricuspid (valve) insufficiency: Secondary | ICD-10-CM

## 2017-10-30 LAB — CBC
HCT: 30.6 % — ABNORMAL LOW (ref 40.0–52.0)
Hemoglobin: 9.2 g/dL — ABNORMAL LOW (ref 13.0–18.0)
MCH: 22.1 pg — ABNORMAL LOW (ref 26.0–34.0)
MCHC: 30.2 g/dL — ABNORMAL LOW (ref 32.0–36.0)
MCV: 73.3 fL — ABNORMAL LOW (ref 80.0–100.0)
Platelets: 318 10*3/uL (ref 150–440)
RBC: 4.18 MIL/uL — ABNORMAL LOW (ref 4.40–5.90)
RDW: 19.5 % — ABNORMAL HIGH (ref 11.5–14.5)
WBC: 6.7 10*3/uL (ref 3.8–10.6)

## 2017-10-30 LAB — BASIC METABOLIC PANEL
Anion gap: 12 (ref 5–15)
BUN: 38 mg/dL — ABNORMAL HIGH (ref 6–20)
CALCIUM: 8.3 mg/dL — AB (ref 8.9–10.3)
CO2: 30 mmol/L (ref 22–32)
Chloride: 100 mmol/L (ref 98–111)
Creatinine, Ser: 9.69 mg/dL — ABNORMAL HIGH (ref 0.61–1.24)
GFR, EST AFRICAN AMERICAN: 6 mL/min — AB (ref >60)
GFR, EST NON AFRICAN AMERICAN: 5 mL/min — AB (ref >60)
Glucose, Bld: 91 mg/dL (ref 70–99)
POTASSIUM: 5.4 mmol/L — AB (ref 3.5–5.1)
Sodium: 142 mmol/L (ref 135–145)

## 2017-10-30 LAB — MRSA PCR SCREENING: MRSA BY PCR: NEGATIVE

## 2017-10-30 LAB — GLUCOSE, CAPILLARY: Glucose-Capillary: 90 mg/dL (ref 70–99)

## 2017-10-30 LAB — ECHOCARDIOGRAM COMPLETE
Height: 75 in
Weight: 3005.31 oz

## 2017-10-30 LAB — TROPONIN I: TROPONIN I: 0.07 ng/mL — AB (ref <0.03)

## 2017-10-30 LAB — PHOSPHORUS: Phosphorus: 6 mg/dL — ABNORMAL HIGH (ref 2.5–4.6)

## 2017-10-30 MED ORDER — ALBUMIN HUMAN 25 % IV SOLN
25.0000 g | Freq: Once | INTRAVENOUS | Status: AC
  Administered 2017-10-30: 25 g via INTRAVENOUS
  Filled 2017-10-30 (×2): qty 100

## 2017-10-30 MED ORDER — HEPARIN SODIUM (PORCINE) 5000 UNIT/ML IJ SOLN
5000.0000 [IU] | Freq: Three times a day (TID) | INTRAMUSCULAR | Status: DC
  Administered 2017-10-30 – 2017-10-31 (×4): 5000 [IU] via SUBCUTANEOUS
  Filled 2017-10-30 (×4): qty 1

## 2017-10-30 MED ORDER — FOLIC ACID 1 MG PO TABS
1.0000 mg | ORAL_TABLET | Freq: Every day | ORAL | Status: DC
  Administered 2017-10-30 – 2017-10-31 (×2): 1 mg via ORAL
  Filled 2017-10-30 (×2): qty 1

## 2017-10-30 MED ORDER — TIOTROPIUM BROMIDE MONOHYDRATE 18 MCG IN CAPS
18.0000 ug | ORAL_CAPSULE | Freq: Every day | RESPIRATORY_TRACT | Status: DC
  Administered 2017-10-30 – 2017-10-31 (×2): 18 ug via RESPIRATORY_TRACT
  Filled 2017-10-30 (×2): qty 5

## 2017-10-30 MED ORDER — RENA-VITE PO TABS
1.0000 | ORAL_TABLET | Freq: Every day | ORAL | Status: DC
  Administered 2017-10-30 – 2017-10-31 (×2): 1 via ORAL
  Filled 2017-10-30 (×2): qty 1

## 2017-10-30 MED ORDER — IPRATROPIUM-ALBUTEROL 0.5-2.5 (3) MG/3ML IN SOLN: 3.0000 mL | Freq: Four times a day (QID) | RESPIRATORY_TRACT | Status: DC | PRN

## 2017-10-30 MED ORDER — CHLORHEXIDINE GLUCONATE CLOTH 2 % EX PADS
6.0000 | MEDICATED_PAD | Freq: Every day | CUTANEOUS | Status: DC
  Administered 2017-10-30 – 2017-10-31 (×2): 6 via TOPICAL

## 2017-10-30 MED ORDER — DOCUSATE SODIUM 100 MG PO CAPS
100.0000 mg | ORAL_CAPSULE | Freq: Two times a day (BID) | ORAL | Status: DC
  Administered 2017-10-30 – 2017-10-31 (×4): 100 mg via ORAL
  Filled 2017-10-30 (×4): qty 1

## 2017-10-30 MED ORDER — LORAZEPAM 2 MG/ML IJ SOLN: 0.0000 mg | Freq: Two times a day (BID) | INTRAMUSCULAR | Status: DC

## 2017-10-30 MED ORDER — FLUTICASONE FUROATE-VILANTEROL 200-25 MCG/INH IN AEPB
1.0000 | INHALATION_SPRAY | Freq: Every day | RESPIRATORY_TRACT | Status: DC
  Administered 2017-10-30 – 2017-10-31 (×2): 1 via RESPIRATORY_TRACT
  Filled 2017-10-30 (×2): qty 28

## 2017-10-30 MED ORDER — DIALYVITE/ZINC PO TABS: 1.0000 | ORAL_TABLET | Freq: Every day | ORAL | Status: DC

## 2017-10-30 MED ORDER — ONDANSETRON HCL 4 MG PO TABS: 4.0000 mg | ORAL_TABLET | Freq: Four times a day (QID) | ORAL | Status: DC | PRN

## 2017-10-30 MED ORDER — ALBUTEROL SULFATE (2.5 MG/3ML) 0.083% IN NEBU: 2.5000 mg | INHALATION_SOLUTION | Freq: Four times a day (QID) | RESPIRATORY_TRACT | Status: DC | PRN

## 2017-10-30 MED ORDER — ADULT MULTIVITAMIN W/MINERALS CH
1.0000 | ORAL_TABLET | Freq: Every day | ORAL | Status: DC
  Administered 2017-10-30: 1 via ORAL
  Filled 2017-10-30: qty 1

## 2017-10-30 MED ORDER — ACETAMINOPHEN 325 MG PO TABS: 650.0000 mg | ORAL_TABLET | Freq: Four times a day (QID) | ORAL | Status: DC | PRN

## 2017-10-30 MED ORDER — LORAZEPAM 2 MG/ML IJ SOLN
0.0000 mg | Freq: Four times a day (QID) | INTRAMUSCULAR | Status: DC
  Administered 2017-10-30 – 2017-10-31 (×2): 2 mg via INTRAVENOUS
  Filled 2017-10-30 (×3): qty 1

## 2017-10-30 MED ORDER — BISACODYL 5 MG PO TBEC: 5.0000 mg | DELAYED_RELEASE_TABLET | Freq: Every day | ORAL | Status: DC | PRN

## 2017-10-30 MED ORDER — GABAPENTIN 100 MG PO CAPS
100.0000 mg | ORAL_CAPSULE | Freq: Two times a day (BID) | ORAL | Status: DC
  Administered 2017-10-30 – 2017-10-31 (×2): 100 mg via ORAL
  Filled 2017-10-30 (×2): qty 1

## 2017-10-30 MED ORDER — LABETALOL HCL 100 MG PO TABS
100.0000 mg | ORAL_TABLET | Freq: Every day | ORAL | Status: DC
  Administered 2017-10-30: 100 mg via ORAL
  Filled 2017-10-30 (×3): qty 1

## 2017-10-30 MED ORDER — GABAPENTIN 300 MG PO CAPS
300.0000 mg | ORAL_CAPSULE | Freq: Two times a day (BID) | ORAL | Status: DC
  Administered 2017-10-30 (×2): 300 mg via ORAL
  Filled 2017-10-30 (×2): qty 1

## 2017-10-30 MED ORDER — THIAMINE HCL 100 MG/ML IJ SOLN: 100.0000 mg | Freq: Every day | INTRAMUSCULAR | Status: DC

## 2017-10-30 MED ORDER — DIPHENHYDRAMINE HCL 25 MG PO CAPS
50.0000 mg | ORAL_CAPSULE | Freq: Every evening | ORAL | Status: DC | PRN
  Administered 2017-10-30: 50 mg via ORAL
  Filled 2017-10-30: qty 2

## 2017-10-30 MED ORDER — ONDANSETRON HCL 4 MG/2ML IJ SOLN: 4.0000 mg | Freq: Four times a day (QID) | INTRAMUSCULAR | Status: DC | PRN

## 2017-10-30 MED ORDER — FERROUS SULFATE 325 (65 FE) MG PO TABS
325.0000 mg | ORAL_TABLET | Freq: Three times a day (TID) | ORAL | Status: DC
  Administered 2017-10-30 – 2017-10-31 (×4): 325 mg via ORAL
  Filled 2017-10-30 (×4): qty 1

## 2017-10-30 MED ORDER — LORAZEPAM 1 MG PO TABS: 1.0000 mg | ORAL_TABLET | Freq: Four times a day (QID) | ORAL | Status: DC | PRN

## 2017-10-30 MED ORDER — CALCIUM ACETATE (PHOS BINDER) 667 MG PO CAPS
2001.0000 mg | ORAL_CAPSULE | Freq: Three times a day (TID) | ORAL | Status: DC
  Administered 2017-10-30 – 2017-10-31 (×4): 2001 mg via ORAL
  Filled 2017-10-30 (×4): qty 3

## 2017-10-30 MED ORDER — LORAZEPAM 2 MG/ML IJ SOLN
1.0000 mg | Freq: Four times a day (QID) | INTRAMUSCULAR | Status: DC | PRN
  Filled 2017-10-30: qty 1

## 2017-10-30 MED ORDER — HYDROCODONE-ACETAMINOPHEN 5-325 MG PO TABS
1.0000 | ORAL_TABLET | ORAL | Status: DC | PRN
  Administered 2017-10-30 (×2): 2 via ORAL
  Filled 2017-10-30 (×2): qty 2

## 2017-10-30 MED ORDER — VITAMIN B-1 100 MG PO TABS
100.0000 mg | ORAL_TABLET | Freq: Every day | ORAL | Status: DC
  Administered 2017-10-30 – 2017-10-31 (×2): 100 mg via ORAL
  Filled 2017-10-30 (×2): qty 1

## 2017-10-30 MED ORDER — ACETAMINOPHEN 650 MG RE SUPP: 650.0000 mg | Freq: Four times a day (QID) | RECTAL | Status: DC | PRN

## 2017-10-30 MED ORDER — PANTOPRAZOLE SODIUM 40 MG PO TBEC
40.0000 mg | DELAYED_RELEASE_TABLET | Freq: Every day | ORAL | Status: DC
  Administered 2017-10-30 – 2017-10-31 (×2): 40 mg via ORAL
  Filled 2017-10-30 (×2): qty 1

## 2017-10-30 MED ORDER — HEPARIN SODIUM (PORCINE) 1000 UNIT/ML DIALYSIS: 1000.0000 [IU] | INTRAMUSCULAR | Status: DC | PRN

## 2017-10-30 NOTE — Progress Notes (Signed)
Stanley [name] at [place] NAME: Stanley [name]    MR#:  [id number]  DATE OF BIRTH:  [birth year]  SUBJECTIVE:  in with increasing shortness of breath. Was found to have acute pulmonary edema. Just got back from dialysis removed 2 L of fluid. Patient feels a lot better. Has chronic leg pain.  REVIEW OF SYSTEMS:   Review of Systems  Constitutional: Negative for chills, fever and weight loss.  HENT: Negative for ear discharge, ear pain and nosebleeds.   Eyes: Negative for blurred vision, pain and discharge.  Respiratory: Positive for shortness of breath. Negative for sputum production, wheezing and stridor.   Cardiovascular: Negative for chest pain, palpitations, orthopnea and PND.  Gastrointestinal: Negative for abdominal pain, diarrhea, nausea and vomiting.  Genitourinary: Negative for frequency and urgency.  Musculoskeletal: Positive for joint pain and myalgias. Negative for back pain.  Neurological: Negative for sensory change, speech change, focal weakness and weakness.  Psychiatric/Behavioral: Negative for depression and hallucinations. The patient is not nervous/anxious.    Tolerating Diet:yes Tolerating PT: not needed  DRUG ALLERGIES:  No Known Allergies  VITALS:  Blood pressure (!) 149/88, pulse 88, temperature 98.4 F (36.9 C), temperature source Oral, resp. rate 19, height 6\' 3"  (1.905 m), weight 85.2 kg, SpO2 97 %.  PHYSICAL EXAMINATION:   Physical Exam  GENERAL:  58 y.o.-year-old patient lying in the bed with no acute distress.  EYES: Pupils equal, round, reactive to light and accommodation. No scleral icterus. Extraocular muscles intact.  HEENT: Head atraumatic, normocephalic. Oropharynx and nasopharynx clear.  NECK:  Supple, no jugular venous distention. No thyroid enlargement, no tenderness.  LUNGS: Normal breath sounds bilaterally, no wheezing, rales, rhonchi. No use of accessory muscles of respiration.   CARDIOVASCULAR: S1, S2 normal. No murmurs, rubs, or gallops.  ABDOMEN: Soft, nontender, distended. Bowel sounds present. No organomegaly or mass. Ascites+ EXTREMITIES: No cyanosis, clubbing or edema b/l.    NEUROLOGIC: Cranial nerves II through XII are intact. No focal Motor or sensory deficits b/l.   PSYCHIATRIC:  patient is alert and oriented x 3.  SKIN: No obvious rash, lesion, or ulcer.   LABORATORY PANEL:  CBC Recent Labs  Lab 10/30/17 0546  WBC 6.7  HGB 9.2*  HCT 30.6*  PLT 318    Chemistries  Recent Labs  Lab 10/30/17 0546  NA 142  K 5.4*  CL 100  CO2 30  GLUCOSE 91  BUN 38*  CREATININE 9.69*  CALCIUM 8.3*   Cardiac Enzymes Recent Labs  Lab 10/30/17 0546  TROPONINI 0.07*   RADIOLOGY:  Dg Chest Portable 1 View  Result Date: 10/29/2017 CLINICAL DATA:  Cramping and ascites. Shortness of breath. History of end-stage renal disease on dialysis, CHF. EXAM: PORTABLE CHEST 1 VIEW COMPARISON:  Chest radiograph September 25, 2017 FINDINGS: Stable cardiomegaly. Pulmonary vascular congestion mild interstitial prominence without pleural effusion or focal consolidation. Calcified aortic arch. Bibasilar atelectasis/scarring. Status post median sternotomy for CABG. Tunneled dialysis catheter via RIGHT internal jugular venous approach with distal tip projecting in distal superior vena cava. No pneumothorax. Soft tissue planes and included osseous structures are non suspicious. IMPRESSION: 1. Stable cardiomegaly. 2. Similar interstitial prominence compatible with pulmonary edema. 3.  Aortic Atherosclerosis (ICD10-I70.0). Electronically Signed   By: Elon Alas M.D.   On: 10/29/2017 19:48   ASSESSMENT AND PLAN:   Stanley [name]  is a 57 y.o. male with a known history of tobacco, alcohol abuse, end-stage renal disease on hemodialysis, CHF and  alcoholic cirrhosis with recurrent large ascites. Patient presented to emergency room for worsening shortness of breath going on for the past 2  days.  His symptoms are worse with exertion and with laying down flat.  Patient thinks that his shortness of breath is likely precipitated by the fact that his last hemodialysis he has had to be short due to muscle cramps.  1.  Acute respiratory distress, multifactorial, secondary to pulmonary edema, large ascites and chronic anemia.  -  patient had dialysis this morning 2 L were removed. He is doing very well.  2.  Acute pulmonary edema, will start treatment with Lasix IV and oxygen therapy.   -had hemodialysis and is doing very well. 2 L drip fluid was removed.  3.  End-stage renal disease.  Continue hemodialysis treatment per nephrology.  4.  Anemia of chronic diseases and iron deficiency.  Hemoglobin level is stable at 9.2.  No active bleeding.  Continue treatment with iron and EPO shots, as needed per nephrology we will continue to monitor hemoglobin level closely and transfuse PRBC, as needed for hemoglobin level lower than 7.   5.  Elevated troponin level at 0.07.  This seems to be the baseline for the patient and is likely related to kidney failure.  We will continue to monitor patient on telemetry and follow troponin levels to rule out ACS.    6.  Alcoholic liver cirrhosis, with large ascites, requiring recurrent paracentesis.   -will order therapeutic paracentesis tomorrow.  7.  Tobacco abuse.  Smoking cessation was discussed with patient in detail.  Case discussed with Care Management/Social Worker. Management plans discussed with the patient, family and they are in agreement.  CODE STATUS: full DVT Prophylaxis: hepairn  TOTAL TIME TAKING CARE OF THIS PATIENT: 68minutes.  >50% time spent on counselling and coordination of care  POSSIBLE D/C IN **1-2 DAYS, DEPENDING ON CLINICAL CONDITION.  Note: This dictation was prepared with Dragon dictation along with smaller phrase technology. Any transcriptional errors that result from this process are unintentional.  Fritzi Mandes M.D  on 10/30/2017 at 4:02 PM  Between 7am to 6pm - Pager - [phone]  After 6pm go to [email or web address] - password [email or web address] [facility]  Office  [phone]  CC: Primary care physician; Stanley [name], MDPatient ID: Stanley [name], male   DOB: 1960/11/23, 57 y.o.   MRN: [id number]

## 2017-10-30 NOTE — Progress Notes (Signed)
HD Tx started    10/30/17 0945  Vital Signs  Pulse Rate 79  Pulse Rate Source Monitor  Resp 16  BP Location Left Arm  BP Method Automatic  Patient Position (if appropriate) Lying  Oxygen Therapy  SpO2 100 %  O2 Device Room Air  During Hemodialysis Assessment  Blood Flow Rate (mL/min) 400 mL/min  Arterial Pressure (mmHg) -160 mmHg  Venous Pressure (mmHg) 140 mmHg  Transmembrane Pressure (mmHg) 60 mmHg  Ultrafiltration Rate (mL/min) 720 mL/min  Dialysate Flow Rate (mL/min) 800 ml/min  Conductivity: Machine  15.2  HD Safety Checks Performed Yes  Dialysis Fluid Bolus Normal Saline  Bolus Amount (mL) 250 mL  Intra-Hemodialysis Comments Tx initiated

## 2017-10-30 NOTE — Progress Notes (Signed)
[facility]  ROUNDING NOTE   Subjective:  Patient seen and evaluated during dialysis. Tolerating well.    HEMODIALYSIS FLOWSHEET:  Blood Flow Rate (mL/min): 400 mL/min Arterial Pressure (mmHg): -160 mmHg Venous Pressure (mmHg): 140 mmHg Transmembrane Pressure (mmHg): 60 mmHg Ultrafiltration Rate (mL/min): 720 mL/min Dialysate Flow Rate (mL/min): 800 ml/min Conductivity: Machine : 15.4 Conductivity: Machine : 15.4 Dialysis Fluid Bolus: Normal Saline Bolus Amount (mL): 250 mL    Objective:  Vital signs in last 24 hours:  Temp:  [98.3 F (36.8 C)-99.4 F (37.4 C)] 98.7 F (37.1 C) (08/19 0936) Pulse Rate:  [77-92] 77 (08/19 1200) Resp:  [14-26] 18 (08/19 1200) BP: (114-149)/(63-103) 121/72 (08/19 1200) SpO2:  [96 %-100 %] 100 % (08/19 1200) Weight:  [86.8 kg-87.3 kg] 87.3 kg (08/19 0936)  Weight change:  Filed Weights   10/29/17 1900 10/30/17 0936  Weight: 86.8 kg 87.3 kg    Intake/Output: No intake/output data recorded.   Intake/Output this shift:  No intake/output data recorded.  Physical Exam: General: No acute distress  Head: Normocephalic, atraumatic. Moist oral mucosal membranes  Neck: Supple  Lungs:  Clear to auscultation, normal effort  Heart: S1S2 no rubs, prominent systolic murmur  Abdomen:  + distended, ascites  Extremities: + peripheral edema.  Neurologic: Sleeping during treatment  Skin: No lesions  Access: R IJ permcath    Basic Metabolic Panel: Recent Labs  Lab 10/25/17 1146 10/25/17 1406 10/29/17 1859 10/30/17 0546  NA 143 142 141 142  K 6.3* 6.2* 5.7* 5.4*  CL 99 97* 101 100  CO2 30 30 27 30   GLUCOSE 115* 91 110* 91  BUN 53* 53* 33* 38*  CREATININE 13.32* 13.06* 9.14* 9.69*  CALCIUM 8.1* 8.0* 8.1* 8.3*  PHOS  --  7.5*  --  6.0*    Liver Function Tests: Recent Labs  Lab 10/25/17 1406  ALBUMIN 2.8*   No results for input(s): LIPASE, AMYLASE in the last 168 hours. No results for input(s): AMMONIA in the last  168 hours.  CBC: Recent Labs  Lab 10/25/17 1406 10/29/17 1859 10/30/17 0546  WBC 9.3 7.9 6.7  NEUTROABS  --  5.7  --   HGB 9.3* 9.2* 9.2*  HCT 30.4* 29.2* 30.6*  MCV 74.5* 72.6* 73.3*  PLT 356 321 318    Cardiac Enzymes: Recent Labs  Lab 10/29/17 1859 10/30/17 0546  TROPONINI 0.07* 0.07*    BNP: Invalid input(s): POCBNP  CBG: Recent Labs  Lab 10/30/17 0735  GLUCAP 90    Microbiology: Results for orders placed or performed during the hospital encounter of 10/29/17  MRSA PCR Screening     Status: None   Collection Time: 10/30/17 12:28 AM  Result Value Ref Range Status   MRSA by PCR NEGATIVE NEGATIVE Final    Comment:        The GeneXpert MRSA Assay (FDA approved for NASAL specimens only), is one component of a comprehensive MRSA colonization surveillance program. It is not intended to diagnose MRSA infection nor to guide or monitor treatment for MRSA infections. Performed at [facility], [address] [place], [place] [address]     Coagulation Studies: No results for input(s): LABPROT, INR in the last 72 hours.  Urinalysis: No results for input(s): COLORURINE, LABSPEC, PHURINE, GLUCOSEU, HGBUR, BILIRUBINUR, KETONESUR, PROTEINUR, UROBILINOGEN, NITRITE, LEUKOCYTESUR in the last 72 hours.  Invalid input(s): APPERANCEUR    Imaging: Dg Chest Portable 1 View  Result Date: 10/29/2017 CLINICAL DATA:  Cramping and ascites. Shortness of breath.  History of end-stage renal disease on dialysis, CHF. EXAM: PORTABLE CHEST 1 VIEW COMPARISON:  Chest radiograph September 25, 2017 FINDINGS: Stable cardiomegaly. Pulmonary vascular congestion mild interstitial prominence without pleural effusion or focal consolidation. Calcified aortic arch. Bibasilar atelectasis/scarring. Status post median sternotomy for CABG. Tunneled dialysis catheter via RIGHT internal jugular venous approach with distal tip projecting in distal superior vena cava. No pneumothorax. Soft tissue  planes and included osseous structures are non suspicious. IMPRESSION: 1. Stable cardiomegaly. 2. Similar interstitial prominence compatible with pulmonary edema. 3.  Aortic Atherosclerosis (ICD10-I70.0). Electronically Signed   By: Elon Alas M.D.   On: 10/29/2017 19:48     Medications:       Assessment/ Plan:  57 y.o. black male withend stage renal disease on hemodialysis secondary to Alport's syndrome, ascites, hypertension, anemia of chronic kidney disease, coronary artery disease, peripheral vascular disease, hyperlipidemia, gastrointestinal AVMs, pulmonary hypertension  [facility]   1. End stage renal disease: Patient seen and evaluated during hemodialysis.  Ultrafiltration target 2 kg.  We will plan for dialysis again tomorrow as well.  2.    Hyperkalemia.  Serum potassium 5.4.  Dialyzing against a 2K bath today.   3. Ascites: Ascites increasing.  Consider paracentesis but defer to hospitalist.  4. Anemia of chronic kidney disease:  - Hgb 9.2 and close to target.    LOS: 1 Kaedance Magos 8/19/201912:15 PM

## 2017-10-30 NOTE — Progress Notes (Signed)
Post HD Assessment   10/30/17 1405  Neurological  Level of Consciousness Alert  Orientation Level Oriented X4  Cardiac  Pulse Regular  Heart Sounds S1, S2  ECG Monitor Yes  Vascular  R Radial Pulse +2  L Radial Pulse +2  Edema Generalized  Generalized Edema +1  Integumentary  Skin Color Appropriate for ethnicity  Musculoskeletal  Musculoskeletal (WDL) X  Generalized Weakness Yes  Gastrointestinal  Bowel Sounds Assessment Active  GU Assessment  Genitourinary (WDL) X  Psychosocial  Psychosocial (WDL) WDL

## 2017-10-30 NOTE — Progress Notes (Signed)
HD Tx completed, tolerated well, pt met UF goal.    10/30/17 1345  Vital Signs  Pulse Rate 81  Pulse Rate Source Monitor  Resp 18  BP (!) 153/79  BP Location Left Arm  BP Method Automatic  Patient Position (if appropriate) Lying  Oxygen Therapy  SpO2 100 %  O2 Device Room Air  Pulse Oximetry Type Continuous  During Hemodialysis Assessment  HD Safety Checks Performed Yes  KECN 81.1 KECN  Dialysis Fluid Bolus Normal Saline  Bolus Amount (mL) 250 mL  Intra-Hemodialysis Comments Tx completed;Tolerated well

## 2017-10-30 NOTE — Plan of Care (Signed)
Sitting up in bed,No visual distress noted,complained of leg pain,medicated the same per protocol.Will continue planned regimen.[place]bell within reach.

## 2017-10-30 NOTE — Progress Notes (Signed)
Post HD Tx    10/30/17 1349  Hand-Off documentation  Report given to (Full Name) Charlynn Court, RN   Report received from (Full Name) Beatris Ship, RN   Vital Signs  Temp 98.7 F (37.1 C)  Temp Source Oral  Pulse Rate 86  Pulse Rate Source Monitor  Resp 14  BP (!) 141/92  BP Location Left Arm  BP Method Automatic  Patient Position (if appropriate) Lying  Oxygen Therapy  SpO2 100 %  O2 Device Room Air  Pulse Oximetry Type Continuous  Pain Assessment  Pain Scale 0-10  Pain Score 0  Dialysis Weight  Weight 85.2 kg  Type of Weight Post-Dialysis  Hemodialysis Catheter Right Internal jugular Double-lumen  No Placement Date or Time found.   Placed prior to admission: Yes  Orientation: Right  Access Location: Internal jugular  Hemodialysis Catheter Type: Double-lumen  Site Condition No complications  Blue Lumen Status Heparin locked  Red Lumen Status Heparin locked  Purple Lumen Status N/A  Catheter fill solution Heparin 1000 units/ml  Catheter fill volume (Arterial) 1.5 cc  Catheter fill volume (Venous) 1.5  Post treatment catheter status Capped and Clamped

## 2017-10-30 NOTE — Progress Notes (Signed)
*  PRELIMINARY RESULTS* Echocardiogram 2D Echocardiogram has been performed.  Stanley [name] 10/30/2017, 3:24 PM

## 2017-10-30 NOTE — Progress Notes (Signed)
Pre HD assessment    10/30/17 0932  Neurological  Level of Consciousness Alert  Orientation Level Oriented X4  Cardiac  Pulse Regular  Heart Sounds S1, S2  ECG Monitor Yes  Vascular  R Radial Pulse +2  L Radial Pulse +2  Edema Generalized  Generalized Edema +1  Integumentary  Skin Color Appropriate for ethnicity  Musculoskeletal  Musculoskeletal (WDL) X  Generalized Weakness Yes  Gastrointestinal  Bowel Sounds Assessment Active  GU Assessment  Genitourinary (WDL) X  Psychosocial  Psychosocial (WDL) WDL

## 2017-10-30 NOTE — Progress Notes (Signed)
Pre HD Tx    10/30/17 0936  Vital Signs  Temp 98.7 F (37.1 C)  Temp Source Oral  Pulse Rate 79  Pulse Rate Source Monitor  Resp 16  BP (!) 147/92  BP Location Left Arm  BP Method Automatic  Patient Position (if appropriate) Lying  Oxygen Therapy  SpO2 100 %  O2 Device Room Air  Pain Assessment  Pain Scale 0-10  Pain Score 0  Dialysis Weight  Weight 87.3 kg  Type of Weight Pre-Dialysis  Time-Out for Hemodialysis  What Procedure? HD   Pt Identifiers(min of two) First/Last Name;Pt's DOB(use if MRN/Acct# not available  Correct Site? Yes  Correct Side? Yes  Correct Procedure? Yes  Consents Verified? Yes  Rad Studies Available? N/A  Safety Precautions Reviewed? Yes  Engineer, civil (consulting) Number [phone]  Station Number 3  UF/Alarm Test Passed  Conductivity: Meter 14  Conductivity: Machine  13.9  pH 7.4  Reverse Osmosis Main  Normal Saline Lot Number [id number]  Dialyzer Lot Number [id number]  Disposable Set Lot Number [id number]  Machine Temperature 98.6 F (37 C)  Musician and Audible Yes  Pre Treatment Patient Checks  Vascular access used during treatment Catheter  Hepatitis B Surface Antigen Results Negative  Hepatitis B Surface Antibody  (>10 )  Date Hepatitis B Surface Antibody Drawn 06/16/17  Hemodialysis Consent Verified Yes  Hemodialysis Standing Orders Initiated Yes  ECG (Telemetry) Monitor On Yes  Prime Ordered Normal Saline  Length of  DialysisTreatment -hour(s) 3.5 Hour(s)  Dialysis Treatment Comments Na 140  Dialysis Anticoagulant None  Dialysate Flow Ordered 800  Blood Flow Rate Ordered 400 mL/min  Ultrafiltration Goal 2 Liters  Pre Treatment Labs Phosphorus (PTH)  Dialysis Blood Pressure Support Ordered Normal Saline  Hemodialysis Catheter Right Internal jugular Double-lumen  No Placement Date or Time found.   Placed prior to admission: Yes  Orientation: Right  Access Location: Internal jugular  Hemodialysis Catheter Type: Double-lumen   Site Condition No complications  Blue Lumen Status Flushed  Red Lumen Status Flushed  Dressing Type Gauze/Drain sponge  Dressing Status Clean;Dry;Intact  Interventions Dressing changed

## 2017-10-30 NOTE — Care Management (Signed)
Amanda Morris dialysis liaison notified of admission.    

## 2017-10-31 LAB — GLUCOSE, CAPILLARY: Glucose-Capillary: 83 mg/dL (ref 70–99)

## 2017-10-31 LAB — PHOSPHORUS: PHOSPHORUS: 5.1 mg/dL — AB (ref 2.5–4.6)

## 2017-10-31 LAB — PARATHYROID HORMONE, INTACT (NO CA): PTH: 657 pg/mL — ABNORMAL HIGH (ref 15–65)

## 2017-10-31 MED ORDER — SODIUM CHLORIDE 0.9 % IV SOLN: 100.0000 mL | INTRAVENOUS | Status: DC | PRN

## 2017-10-31 MED ORDER — RENA-VITE PO TABS: 1.0000 | ORAL_TABLET | Freq: Every day | ORAL | 0 refills | Status: AC

## 2017-10-31 MED ORDER — LIDOCAINE-PRILOCAINE 2.5-2.5 % EX CREA
1.0000 "application " | TOPICAL_CREAM | CUTANEOUS | Status: DC | PRN
  Filled 2017-10-31: qty 5

## 2017-10-31 MED ORDER — ALTEPLASE 2 MG IJ SOLR: 2.0000 mg | Freq: Once | INTRAMUSCULAR | Status: DC | PRN

## 2017-10-31 MED ORDER — FOLIC ACID 1 MG PO TABS
1.0000 mg | ORAL_TABLET | Freq: Every day | ORAL | 1 refills | Status: AC
Start: 2017-10-31 — End: ?

## 2017-10-31 MED ORDER — HYDROMORPHONE HCL 1 MG/ML IJ SOLN
0.5000 mg | INTRAMUSCULAR | Status: AC
  Administered 2017-10-31: 0.5 mg via INTRAVENOUS
  Filled 2017-10-31: qty 0.5

## 2017-10-31 MED ORDER — PENTAFLUOROPROP-TETRAFLUOROETH EX AERO
1.0000 "application " | INHALATION_SPRAY | CUTANEOUS | Status: DC | PRN
  Filled 2017-10-31: qty 30

## 2017-10-31 MED ORDER — LIDOCAINE HCL (PF) 1 % IJ SOLN: 5.0000 mL | INTRAMUSCULAR | Status: DC | PRN

## 2017-10-31 NOTE — Care Management (Signed)
Notified Stanley [name] with [facility] of discharge. Patient declines discussion with CM

## 2017-10-31 NOTE — Progress Notes (Signed)
HD initiated via R Chest HD cath without issue. No heparin treatment as ordered. UF goal 2.5L. Patient vomited upon arrival to HD unit. Stated his chest felt heavy. Oxygen administered. Patient currently states relief. Requesting benadryl. No other complaints. Nausea has now subsided per patient report. Continue to monitor closely

## 2017-10-31 NOTE — Progress Notes (Signed)
[facility]  ROUNDING NOTE   Subjective:  Patient seen and evaluated during hemodialysis. Did undergo paracentesis yesterday. Shortness of breath improved.   Objective:  Vital signs in last 24 hours:  Temp:  [97.7 F (36.5 C)-98.7 F (37.1 C)] 97.9 F (36.6 C) (08/20 0907) Pulse Rate:  [70-88] 76 (08/20 1000) Resp:  [14-21] 15 (08/20 1000) BP: (105-155)/(58-100) 122/69 (08/20 1000) SpO2:  [92 %-100 %] 97 % (08/20 1000) Weight:  [80.5 kg-85.2 kg] 80.5 kg (08/20 0907)  Weight change: 0.5 kg Filed Weights   10/30/17 0936 10/30/17 1349 10/31/17 0907  Weight: 87.3 kg 85.2 kg 80.5 kg    Intake/Output: I/O last 3 completed shifts: In: 460 [P.O.:360; IV Piggyback:100] Out: -    Intake/Output this shift:  No intake/output data recorded.  Physical Exam: General: No acute distress  Head: Normocephalic, atraumatic. Moist oral mucosal membranes  Neck: Supple  Lungs:  Clear to auscultation, normal effort  Heart: S1S2 no rubs, prominent systolic murmur  Abdomen:  + distended, ascites  Extremities: + peripheral edema.  Neurologic: Awake, alert  Skin: No lesions  Access: R IJ permcath    Basic Metabolic Panel: Recent Labs  Lab 10/25/17 1146 10/25/17 1406 10/29/17 1859 10/30/17 0546  NA 143 142 141 142  K 6.3* 6.2* 5.7* 5.4*  CL 99 97* 101 100  CO2 30 30 27 30   GLUCOSE 115* 91 110* 91  BUN 53* 53* 33* 38*  CREATININE 13.32* 13.06* 9.14* 9.69*  CALCIUM 8.1* 8.0* 8.1* 8.3*  PHOS  --  7.5*  --  6.0*    Liver Function Tests: Recent Labs  Lab 10/25/17 1406  ALBUMIN 2.8*   No results for input(s): LIPASE, AMYLASE in the last 168 hours. No results for input(s): AMMONIA in the last 168 hours.  CBC: Recent Labs  Lab 10/25/17 1406 10/29/17 1859 10/30/17 0546  WBC 9.3 7.9 6.7  NEUTROABS  --  5.7  --   HGB 9.3* 9.2* 9.2*  HCT 30.4* 29.2* 30.6*  MCV 74.5* 72.6* 73.3*  PLT 356 321 318    Cardiac Enzymes: Recent Labs  Lab 10/29/17 1859  10/30/17 0546  TROPONINI 0.07* 0.07*    BNP: Invalid input(s): POCBNP  CBG: Recent Labs  Lab 10/30/17 0735 10/31/17 0746  GLUCAP 90 63    Microbiology: Results for orders placed or performed during the hospital encounter of 10/29/17  MRSA PCR Screening     Status: None   Collection Time: 10/30/17 12:28 AM  Result Value Ref Range Status   MRSA by PCR NEGATIVE NEGATIVE Final    Comment:        The GeneXpert MRSA Assay (FDA approved for NASAL specimens only), is one component of a comprehensive MRSA colonization surveillance program. It is not intended to diagnose MRSA infection nor to guide or monitor treatment for MRSA infections. Performed at [facility], [address] [place], [place] [address]     Coagulation Studies: No results for input(s): LABPROT, INR in the last 72 hours.  Urinalysis: No results for input(s): COLORURINE, LABSPEC, PHURINE, GLUCOSEU, HGBUR, BILIRUBINUR, KETONESUR, PROTEINUR, UROBILINOGEN, NITRITE, LEUKOCYTESUR in the last 72 hours.  Invalid input(s): APPERANCEUR    Imaging: US Paracentesis  Result Date: 10/31/2017 INDICATION: 57 year old male with alcoholic cirrhosis and recurrent symptomatic large volume ascites. He presents for large volume paracentesis. EXAM: ULTRASOUND GUIDED  PARACENTESIS MEDICATIONS: None. COMPLICATIONS: None immediate. PROCEDURE: Informed written consent was obtained from the patient after a discussion of the risks, benefits and alternatives to  treatment. A timeout was performed prior to the initiation of the procedure. Initial ultrasound scanning demonstrates a large amount of ascites within the right lower abdominal quadrant. The right lower abdomen was prepped and draped in the usual sterile fashion. 1% lidocaine with epinephrine was used for local anesthesia. Following this, a 6 Fr Safe-T-Centesis catheter was introduced. An ultrasound image was saved for documentation purposes. The paracentesis was  performed. The catheter was removed and a dressing was applied. The patient tolerated the procedure well without immediate post procedural complication. FINDINGS: A total of approximately 6300 mL of straw-colored ascitic fluid was removed. IMPRESSION: Successful ultrasound-guided paracentesis yielding 6.3 liters of peritoneal fluid. Electronically Signed   By: Jacqulynn Cadet M.D.   On: 10/31/2017 08:17   Dg Chest Portable 1 View  Result Date: 10/29/2017 CLINICAL DATA:  Cramping and ascites. Shortness of breath. History of end-stage renal disease on dialysis, CHF. EXAM: PORTABLE CHEST 1 VIEW COMPARISON:  Chest radiograph September 25, 2017 FINDINGS: Stable cardiomegaly. Pulmonary vascular congestion mild interstitial prominence without pleural effusion or focal consolidation. Calcified aortic arch. Bibasilar atelectasis/scarring. Status post median sternotomy for CABG. Tunneled dialysis catheter via RIGHT internal jugular venous approach with distal tip projecting in distal superior vena cava. No pneumothorax. Soft tissue planes and included osseous structures are non suspicious. IMPRESSION: 1. Stable cardiomegaly. 2. Similar interstitial prominence compatible with pulmonary edema. 3.  Aortic Atherosclerosis (ICD10-I70.0). Electronically Signed   By: Elon Alas M.D.   On: 10/29/2017 19:48     Medications:       Assessment/ Plan:  57 y.o. black male withend stage renal disease on hemodialysis secondary to Alport's syndrome, ascites, hypertension, anemia of chronic kidney disease, coronary artery disease, peripheral vascular disease, hyperlipidemia, gastrointestinal AVMs, pulmonary hypertension  [facility]   1. End stage renal disease: Patient seen and evaluated during hemodialysis today.  Tolerating well.  We plan to complete treatment today.  Thereafter next treatment will be on Thursday.  2.   Hyperkalemia.  Patient being dialyzed against a 2K bath again today.   3.  Ascites: Patient had paracentesis yesterday and 6.3 L of ascites was removed.  4. Anemia of chronic kidney disease:  -Resume Epogen as an outpatient.   LOS: 2 Bennie Scaff 8/20/201910:07 AM

## 2017-10-31 NOTE — Progress Notes (Signed)
Pre hd 

## 2017-10-31 NOTE — Discharge Instructions (Signed)
Resume your hemodialysis on Tuesday Thursday and Saturday as before

## 2017-10-31 NOTE — Progress Notes (Signed)
Pt discharged per MD order. IV removed. Discharge instructions reviewed with pt. Questions answered to pt satisfaction. Pt taken to car in wheelchair by staff.

## 2017-10-31 NOTE — Discharge Summary (Signed)
[name] at [facility] NAME: Stanley [name]    MR#:  [id number]  DATE OF BIRTH:  [birth year]  DATE OF ADMISSION:  10/29/2017 ADMITTING PHYSICIAN: Stanley Jo, MD  DATE OF DISCHARGE:   PRIMARY CARE PHYSICIAN: Stanley [name], Stanley Jarvis, MD    ADMISSION DIAGNOSIS:  Chronic pulmonary edema [J81.1] Hyperkalemia [E87.5] Elevated troponin I level [R74.8] ESRD on hemodialysis ([place]) [N18.6, Z99.2] Ascites due to alcoholic cirrhosis (HCC) [O67.12] Dyspnea, unspecified type [R06.00]  DISCHARGE DIAGNOSIS:  acute pulmonary edema secondary to volume overload chronic cirrhosis of liver with acute on chronic arthritis status post paracentesis with 6.3 L fluid removal end-stage renal disease on hemodialysis medical noncompliance SECONDARY DIAGNOSIS:   Past Medical History:  Diagnosis Date  . Alcohol abuse   . CHF (congestive heart failure) ([place])   . Cirrhosis ([place])   . Coronary artery disease 2009  . Drug abuse ([place])   . End stage renal disease on dialysis [name]) NEPHROLOGIST-   DR Stanley [name]  IN [place]   HEMODIALYSIS --   TUES/  THURS/  SAT  . Gastrointestinal bleed 06/13/2017   From chart...hx of multiple GI bleeds  . GERD (gastroesophageal reflux disease)   . Hyperlipidemia   . Hypertension   . PAD (peripheral artery disease) ([place])   . Renal insufficiency    Per pt, 32 oz fluid restriction per day  . S/P triple vessel bypass 06/09/2016   2009ish  . Suicidal ideation    & HOMICIDAL IDEATION --  06-16-2013   ADMITTED TO [facility] COURSE:  [name], alcohol abuse, end-stage renal disease on hemodialysis, CHF and alcoholic cirrhosis with recurrent large ascites. Patient presented to emergency room for worsening shortness of breath going on for the past 2 days.His symptoms are worse with exertion and with laying down flat.  1.Acute respiratory  distress,multifactorial,secondary to pulmonary edema/volume overload, large ascites and chronic anemia. - patient had dialysis this morning 2 L were removed. He is doing very well. -sats > 95% on RA  2.Acute pulmonary edema -had hemodialysis and is doing very well. 2 L drip fluid was removed. -sats great on RA  3.End-stage renal disease.Continue hemodialysis treatment per nephrology.  4.Anemia of chronic diseases and iron deficiency.Hemoglobin level is stable at 9.2.No active bleeding.Continue treatment with iron and EPO shots,as needed per nephrology we will continue to monitor hemoglobin level closely and transfuse PRBC,as needed for hemoglobin level lower than 7.   5.Elevated troponin level at 0.07.This seems to be the baseline for the patient and is likely related to kidney failure.trop 0.07--0.07 -no cp  6.Alcoholic liver cirrhosis,with large ascites,requiring recurrent paracentesis. -s/p Paracentesis and removal of 6.3 liters  7.Tobacco abuse.Smoking cessation was discussed with patient in detail.  8. Known h/o Medical noncompliance Frequent admissions and ER visits.  D/c today after hemodialysis. D/w pt  I will NOT be prescribing any narcotics at discharge.  CONSULTS OBTAINED:  Treatment Team:  Stanley Legato, MD Stanley Bellows, MD  DRUG ALLERGIES:  No Known Allergies  DISCHARGE MEDICATIONS:   Allergies as of 10/31/2017   No Known Allergies     Medication List    STOP taking these medications   alum & mag hydroxide-simeth 200-200-20 MG/5ML suspension Commonly known as:  MAALOX/MYLANTA   atorvastatin 40 MG tablet Commonly known as:  LIPITOR   DIALYVITE SUPREME D 3 MG Tabs   doxycycline 100 MG tablet Commonly known as:  VIBRA-TABS   furosemide  80 MG tablet Commonly known as:  LASIX   pantoprazole 40 MG tablet Commonly known as:  PROTONIX     TAKE these medications   albuterol 108 (90 Base) MCG/ACT  inhaler Commonly known as:  PROVENTIL HFA;VENTOLIN HFA Inhale 2 puffs into the lungs every 6 (six) hours as needed for wheezing or shortness of breath.   budesonide-formoterol 160-4.5 MCG/ACT inhaler Commonly known as:  SYMBICORT Inhale 2 puffs into the lungs daily.   calcium acetate 667 MG capsule Commonly known as:  PHOSLO Take 2,001 mg by mouth 3 (three) times daily.   DIALYVITE/ZINC Tabs Take 1 tablet by mouth daily.   ferrous sulfate 325 (65 FE) MG tablet Take 1 tablet by mouth 3 (three) times daily.   folic acid 1 MG tablet Commonly known as:  FOLVITE Take 1 tablet (1 mg total) by mouth daily.   gabapentin 300 MG capsule Commonly known as:  NEURONTIN Take 300 mg by mouth 2 (two) times daily.   ipratropium-albuterol 0.5-2.5 (3) MG/3ML Soln Commonly known as:  DUONEB Take 3 mLs by nebulization every 6 (six) hours as needed.   labetalol 100 MG tablet Commonly known as:  NORMODYNE Take 100 mg by mouth daily.   multivitamin Tabs tablet Take 1 tablet by mouth at bedtime.   nitroGLYCERIN 0.4 MG SL tablet Commonly known as:  NITROSTAT Place 1 tablet (0.4 mg total) under the tongue every 5 (five) minutes as needed.   omeprazole 40 MG capsule Commonly known as:  PRILOSEC Take 40 mg by mouth daily.   Spacer/Aero Chamber Mouthpiece Misc 1 Units by Does not apply route every 4 (four) hours as needed (wheezing).   tiotropium 18 MCG inhalation capsule Commonly known as:  SPIRIVA Place 1 capsule (18 mcg total) into inhaler and inhale daily.       If you experience worsening of your admission symptoms, develop shortness of breath, life threatening emergency, suicidal or homicidal thoughts you must seek medical attention immediately by calling 911 or calling your MD immediately  if symptoms less severe.  You Must read complete instructions/literature along with all the possible adverse reactions/side effects for all the Medicines you take and that have been prescribed to  you. Take any new Medicines after you have completely understood and accept all the possible adverse reactions/side effects.   Please note  You were cared for by a hospitalist during your [name] stay. If you have any questions about your discharge medications or the care you received while you were in the [name] after you are discharged, you can call the unit and asked to speak with the hospitalist on call if the hospitalist that took care of you is not available. Once you are discharged, your primary care physician will handle any further medical issues. Please note that NO REFILLS for any discharge medications will be authorized once you are discharged, as it is imperative that you return to your primary care physician (or establish a relationship with a primary care physician if you do not have one) for your aftercare needs so that they can reassess your need for medications and monitor your lab values. Today   SUBJECTIVE   Chronic right leg pain  VITAL SIGNS:  Blood pressure (!) 123/92, pulse 78, temperature 97.7 F (36.5 C), temperature source Oral, resp. rate 18, height 6\' 3"  (1.905 m), weight 85.2 kg, SpO2 95 %.  I/O:    Intake/Output Summary (Last 24 hours) at 10/31/2017 0846 Last data filed at 10/31/2017 0130 Gross per 24  hour  Intake 460 ml  Output -  Net 460 ml    PHYSICAL EXAMINATION:  GENERAL:  57 y.o.-year-old patient lying in the bed with no acute distress.  EYES: Pupils equal, round, reactive to light and accommodation. No scleral icterus. Extraocular muscles intact.  HEENT: Head atraumatic, normocephalic. Oropharynx and nasopharynx clear.  NECK:  Supple, no jugular venous distention. No thyroid enlargement, no tenderness.  LUNGS: Normal breath sounds bilaterally, no wheezing, rales,rhonchi or crepitation. No use of accessory muscles of respiration.  CARDIOVASCULAR: S1, S2 normal. No murmurs, rubs, or gallops.  ABDOMEN: Soft, non-tender, non-distended. Bowel sounds  present. No organomegaly or mass.  EXTREMITIES: No pedal edema, cyanosis, or clubbing. Skin no changes. Good pulses NEUROLOGIC: Cranial nerves II through XII are intact. Muscle strength 5/5 in all extremities. Sensation intact. Gait not checked.  PSYCHIATRIC: The patient is alert and oriented x 3.  SKIN: No obvious rash, lesion, or ulcer.   DATA REVIEW:   CBC  Recent Labs  Lab 10/30/17 0546  WBC 6.7  HGB 9.2*  HCT 30.6*  PLT 318    Chemistries  Recent Labs  Lab 10/30/17 0546  NA 142  K 5.4*  CL 100  CO2 30  GLUCOSE 91  BUN 38*  CREATININE 9.69*  CALCIUM 8.3*    Microbiology Results   Recent Results (from the past 240 hour(s))  MRSA PCR Screening     Status: None   Collection Time: 10/30/17 12:28 AM  Result Value Ref Range Status   MRSA by PCR NEGATIVE NEGATIVE Final    Comment:        The GeneXpert MRSA Assay (FDA approved for NASAL specimens only), is one component of a comprehensive MRSA colonization surveillance program. It is not intended to diagnose MRSA infection nor to guide or monitor treatment for MRSA infections. Performed at [facility], [address] [place], [place] [address]     RADIOLOGY:  US Paracentesis  Result Date: 10/31/2017 INDICATION: 57 year old male with alcoholic cirrhosis and recurrent symptomatic large volume ascites. He presents for large volume paracentesis. EXAM: ULTRASOUND GUIDED  PARACENTESIS MEDICATIONS: None. COMPLICATIONS: None immediate. PROCEDURE: Informed written consent was obtained from the patient after a discussion of the risks, benefits and alternatives to treatment. A timeout was performed prior to the initiation of the procedure. Initial ultrasound scanning demonstrates a large amount of ascites within the right lower abdominal quadrant. The right lower abdomen was prepped and draped in the usual sterile fashion. 1% lidocaine with epinephrine was used for local anesthesia. Following this, a 6 Fr  Safe-T-Centesis catheter was introduced. An ultrasound image was saved for documentation purposes. The paracentesis was performed. The catheter was removed and a dressing was applied. The patient tolerated the procedure well without immediate post procedural complication. FINDINGS: A total of approximately 6300 mL of straw-colored ascitic fluid was removed. IMPRESSION: Successful ultrasound-guided paracentesis yielding 6.3 liters of peritoneal fluid. Electronically Signed   By: Jacqulynn Cadet M.D.   On: 10/31/2017 08:17   Dg Chest Portable 1 View  Result Date: 10/29/2017 CLINICAL DATA:  Cramping and ascites. Shortness of breath. History of end-stage renal disease on dialysis, CHF. EXAM: PORTABLE CHEST 1 VIEW COMPARISON:  Chest radiograph September 25, 2017 FINDINGS: Stable cardiomegaly. Pulmonary vascular congestion mild interstitial prominence without pleural effusion or focal consolidation. Calcified aortic arch. Bibasilar atelectasis/scarring. Status post median sternotomy for CABG. Tunneled dialysis catheter via RIGHT internal jugular venous approach with distal tip projecting in distal superior vena cava. No pneumothorax. Soft  tissue planes and included osseous structures are non suspicious. IMPRESSION: 1. Stable cardiomegaly. 2. Similar interstitial prominence compatible with pulmonary edema. 3.  Aortic Atherosclerosis (ICD10-I70.0). Electronically Signed   By: Elon Alas M.D.   On: 10/29/2017 19:48     Management plans discussed with the patient, family and they are in agreement.  CODE STATUS:     Code Status Orders  (From admission, onward)         Start     Ordered   10/30/17 0008  Full code  Continuous     10/30/17 0007        Code Status History    Date Active Date Inactive Code Status Order ID Comments User Context   06/12/2017 1707 06/16/2017 1704 Full Code [id number]  Nicholes Mango, MD Inpatient   06/02/2017 1603 06/06/2017 2222 Full Code [id number]  Demetrios Loll, MD Inpatient    05/24/2017 1831 05/25/2017 1921 Full Code [id number]  Saundra Shelling, MD ED   05/03/2017 1331 05/05/2017 1806 Full Code [id number]  Bettey Costa, MD Inpatient   04/01/2017 0947 04/03/2017 1156 Full Code [id number]  Loletha Grayer, MD ED   03/09/2017 0010 03/09/2017 1928 Full Code [id number]  Lance Coon, MD Inpatient   03/03/2017 2027 03/04/2017 1920 Full Code [id number]  Demetrios Loll, MD Inpatient   02/28/2017 2254 03/02/2017 1519 Full Code [id number]  Lance Coon, MD Inpatient   02/22/2017 1721 02/24/2017 1359 Full Code [id number]  Dustin Flock, MD Inpatient   01/22/2017 2337 01/25/2017 1522 Full Code [id number]  Gorden Harms, MD Inpatient   01/07/2017 1445 01/13/2017 1812 Full Code [id number]  Idelle Crouch, MD Inpatient   12/22/2016 1431 12/24/2016 1804 Full Code [id number]  Hillary Bow, MD ED   12/20/2016 2020 12/21/2016 1853 Full Code [id number]  Demetrios Loll, MD Inpatient   11/26/2016 1030 11/28/2016 1522 Full Code [id number]  Henreitta Leber, MD Inpatient   11/15/2016 1930 11/16/2016 1822 Full Code [id number]  Fritzi Mandes, MD Inpatient   10/29/2016 1453 10/30/2016 1506 Full Code [id number]  Loletha Grayer, MD ED   09/15/2016 1757 09/16/2016 1817 Full Code [id number]  Dustin Flock, MD ED   08/16/2016 0945 08/18/2016 1816 Full Code [id number]  Hillary Bow, MD ED   08/09/2016 1659 08/12/2016 2034 Full Code [id number]  Fritzi Mandes, MD Inpatient   07/03/2016 2154 07/05/2016 2122 Full Code [id number]  Demetrios Loll, MD Inpatient   06/24/2016 0423 06/26/2016 1546 Full Code [id number]  Saundra Shelling, MD Inpatient   06/14/2016 1453 06/19/2016 2104 Full Code [id number]  Hillary Bow, MD ED   05/31/2016 0642 06/01/2016 2130 Full Code [id number]  Saundra Shelling, MD Inpatient   05/05/2016 2309 05/10/2016 2157 Full Code [id number]  Vianne Bulls, MD Inpatient   04/16/2016 0356 04/17/2016 1827 Full Code [id number]  Saundra Shelling, MD Inpatient   01/19/2016 1513 01/22/2016 1414 Full Code [id number]  Loletha Grayer, MD ED   09/24/2015 1033  09/25/2015 1331 Full Code [id number]  Loletha Grayer, MD ED   05/16/2015 1417 05/17/2015 2057 Full Code [id number]  Idelle Crouch, MD Inpatient   04/30/2015 2008 05/08/2015 1940 Full Code [id number]  Vaughan Basta, MD Inpatient   04/06/2015 0507 04/07/2015 1736 Full Code [id number]  Lance Coon, MD Inpatient   03/23/2015 1107 03/27/2015 1246 Full Code [id number]  Bettey Costa, MD Inpatient   06/19/2013 1132 06/21/2013 1749 Full Code [id number]  Cristal Ford, DO Inpatient   06/16/2013 1726 06/19/2013 1132 Full Code [id number]  Margarita Mail, PA-C ED   06/05/2013 1533 06/06/2013 1856 Full Code  [id number]  Theodoro Kos, DO Inpatient   06/02/2013 0901 06/05/2013 1533 Full Code [id number]  Velvet Bathe, MD Inpatient   06/02/2013 0840 06/02/2013 0901 Full Code [id number]  Velvet Bathe, MD Inpatient      TOTAL TIME TAKING CARE OF THIS PATIENT: 40 minutes.    Fritzi Mandes M.D on 10/31/2017 at 8:46 AM  Between 7am to 6pm - Pager - [phone] After 6pm go to [email or web address] - password [email or web address] [facility]  Office  [phone]  CC: Primary care physician; [name], MD

## 2017-10-31 NOTE — Evaluation (Signed)
Physical Therapy Evaluation Patient Details Name: Stanley [name] MRN: [id number] DOB: 23-Jun-1960 Today's Date: 10/31/2017   History of Present Illness  Pt is a5 y.o.malewith a known history oftobacco, alcohol abuse, end-stage renal disease on hemodialysis, CHF and alcoholic cirrhosis with recurrent large ascites.  Patient presented to emergency room for worsening shortness of breath going on for the past 2 days.His symptoms are worse with exertion and with laying down flat.  Assessment includes: acute respiratory distress, acute pulmonary edema, ESRD on HD, anemia of chronic disease, elevated troponin at likely baseline for pt, and alcoholic liver cirrhosis with ascites requiring paracentesis.     Clinical Impression  Pt Ind with good speed, effort, and control with bed mobility and transfers.  Pt was able to amb 200 feet without an AD while conversing and with head turns with only occasional mild drifting left/right but was overall steady without LOB.  Pt's SpO2 remained 94-96% during the session with HR WNL, no adverse symptoms noted other than mild SOB.  Pt reports feeling slightly weaker than baseline but stated was not interested in further PT services at this time.  Will complete PT orders and will assess pt at a future date/time pending a change in status upon receipt of new PT orders.      Follow Up Recommendations No PT follow up    Equipment Recommendations  None recommended by PT    Recommendations for Other Services       Precautions / Restrictions Precautions Precautions: Fall Restrictions Weight Bearing Restrictions: No      Mobility  Bed Mobility Overal bed mobility: Independent                Transfers Overall transfer level: Independent Equipment used: None                Ambulation/Gait Ambulation/Gait assistance: Independent Gait Distance (Feet): 200 Feet Assistive device: None Gait Pattern/deviations: WFL(Within Functional Limits)     General Gait Details: Occasional minor drifting but steady without LOB  Stairs            Wheelchair Mobility    Modified Rankin (Stroke Patients Only)       Balance Overall balance assessment: No apparent balance deficits (not formally assessed)                                           Pertinent Vitals/Pain Pain Assessment: 0-10 Pain Score: 8  Pain Location: L foot Pain Descriptors / Indicators: Aching;Sore Pain Intervention(s): Monitored during session;Patient requesting pain meds-RN notified    Home Living Family/patient expects to be discharged to:: Private residence Living Arrangements: Alone Available Help at Discharge: Friend(s);Available PRN/intermittently Type of Home: House Home Access: Stairs to enter Entrance Stairs-Rails: Left Entrance Stairs-Number of Steps: 2 Home Layout: One level Home Equipment: None      Prior Function Level of Independence: Independent         Comments: Pt Ind with amb without an AD limited community distances, no fall history, Ind with all ADLs     Hand Dominance   Dominant Hand: Right    Extremity/Trunk Assessment   Upper Extremity Assessment Upper Extremity Assessment: Overall WFL for tasks assessed    Lower Extremity Assessment Lower Extremity Assessment: Overall WFL for tasks assessed       Communication   Communication: No difficulties  Cognition Arousal/Alertness: Awake/alert Behavior During Therapy: [facility]  for tasks assessed/performed Overall Cognitive Status: Within Functional Limits for tasks assessed                                        General Comments      Exercises     Assessment/Plan    PT Assessment Patent does not need any further PT services  PT Problem List         PT Treatment Interventions      PT Goals (Current goals can be found in the Care Plan section)  Acute Rehab PT Goals PT Goal Formulation: All assessment and education  complete, DC therapy    Frequency     Barriers to discharge        Co-evaluation               AM-PAC PT "6 Clicks" Daily Activity  Outcome Measure Difficulty turning over in bed (including adjusting bedclothes, sheets and blankets)?: None Difficulty moving from lying on back to sitting on the side of the bed? : None Difficulty sitting down on and standing up from a chair with arms (e.g., wheelchair, bedside commode, etc,.)?: None Help needed moving to and from a bed to chair (including a wheelchair)?: None Help needed walking in hospital room?: None Help needed climbing 3-5 steps with a railing? : None 6 Click Score: 24    End of Session   Activity Tolerance: Patient tolerated treatment well Patient left: Other (comment);in bed(Pt with nursing and transport to be taken to HD at end of session) Nurse Communication: Mobility status PT Visit Diagnosis: Muscle weakness (generalized) (M62.81)    Time: 6283-1517 PT Time Calculation (min) (ACUTE ONLY): 16 min   Charges:   PT Evaluation $PT Eval Low Complexity: 1 Low          D. Scott Timara Loma PT, DPT 10/31/17, 9:13 AM

## 2017-10-31 NOTE — Progress Notes (Signed)
Post HD assessment unchanged  

## 2017-10-31 NOTE — Progress Notes (Signed)
HD completed without issue. UF 2.6L. Tolerated well without issue. No further nausea. Patient states breathing improved. Currently sats 99% on room air. Report called to primary RN

## 2017-11-02 ENCOUNTER — Encounter (INDEPENDENT_AMBULATORY_CARE_PROVIDER_SITE_OTHER): Payer: Medicare Other | Admitting: Vascular Surgery

## 2017-11-02 ENCOUNTER — Encounter (INDEPENDENT_AMBULATORY_CARE_PROVIDER_SITE_OTHER): Payer: Medicare Other

## 2017-11-02 ENCOUNTER — Other Ambulatory Visit: Payer: Self-pay | Admitting: Nephrology

## 2017-11-02 DIAGNOSIS — R188 Other ascites: Secondary | ICD-10-CM

## 2017-11-04 DIAGNOSIS — N2581 Secondary hyperparathyroidism of renal origin: Secondary | ICD-10-CM | POA: Diagnosis not present

## 2017-11-04 DIAGNOSIS — Z992 Dependence on renal dialysis: Secondary | ICD-10-CM | POA: Diagnosis not present

## 2017-11-04 DIAGNOSIS — D509 Iron deficiency anemia, unspecified: Secondary | ICD-10-CM | POA: Diagnosis not present

## 2017-11-04 DIAGNOSIS — N186 End stage renal disease: Secondary | ICD-10-CM | POA: Diagnosis not present

## 2017-11-04 DIAGNOSIS — T782XXA Anaphylactic shock, unspecified, initial encounter: Secondary | ICD-10-CM | POA: Diagnosis not present

## 2017-11-06 IMAGING — CR DG CHEST 2V
2 series · 2 of 2 positions shown · non-contrast
Comparison: Chest radiograph performed 03/26/2015

CLINICAL DATA: Acute onset of shortness of breath. Generalized
abdominal distention. Initial encounter.

EXAM:
CHEST  2 VIEW

[chest pa]
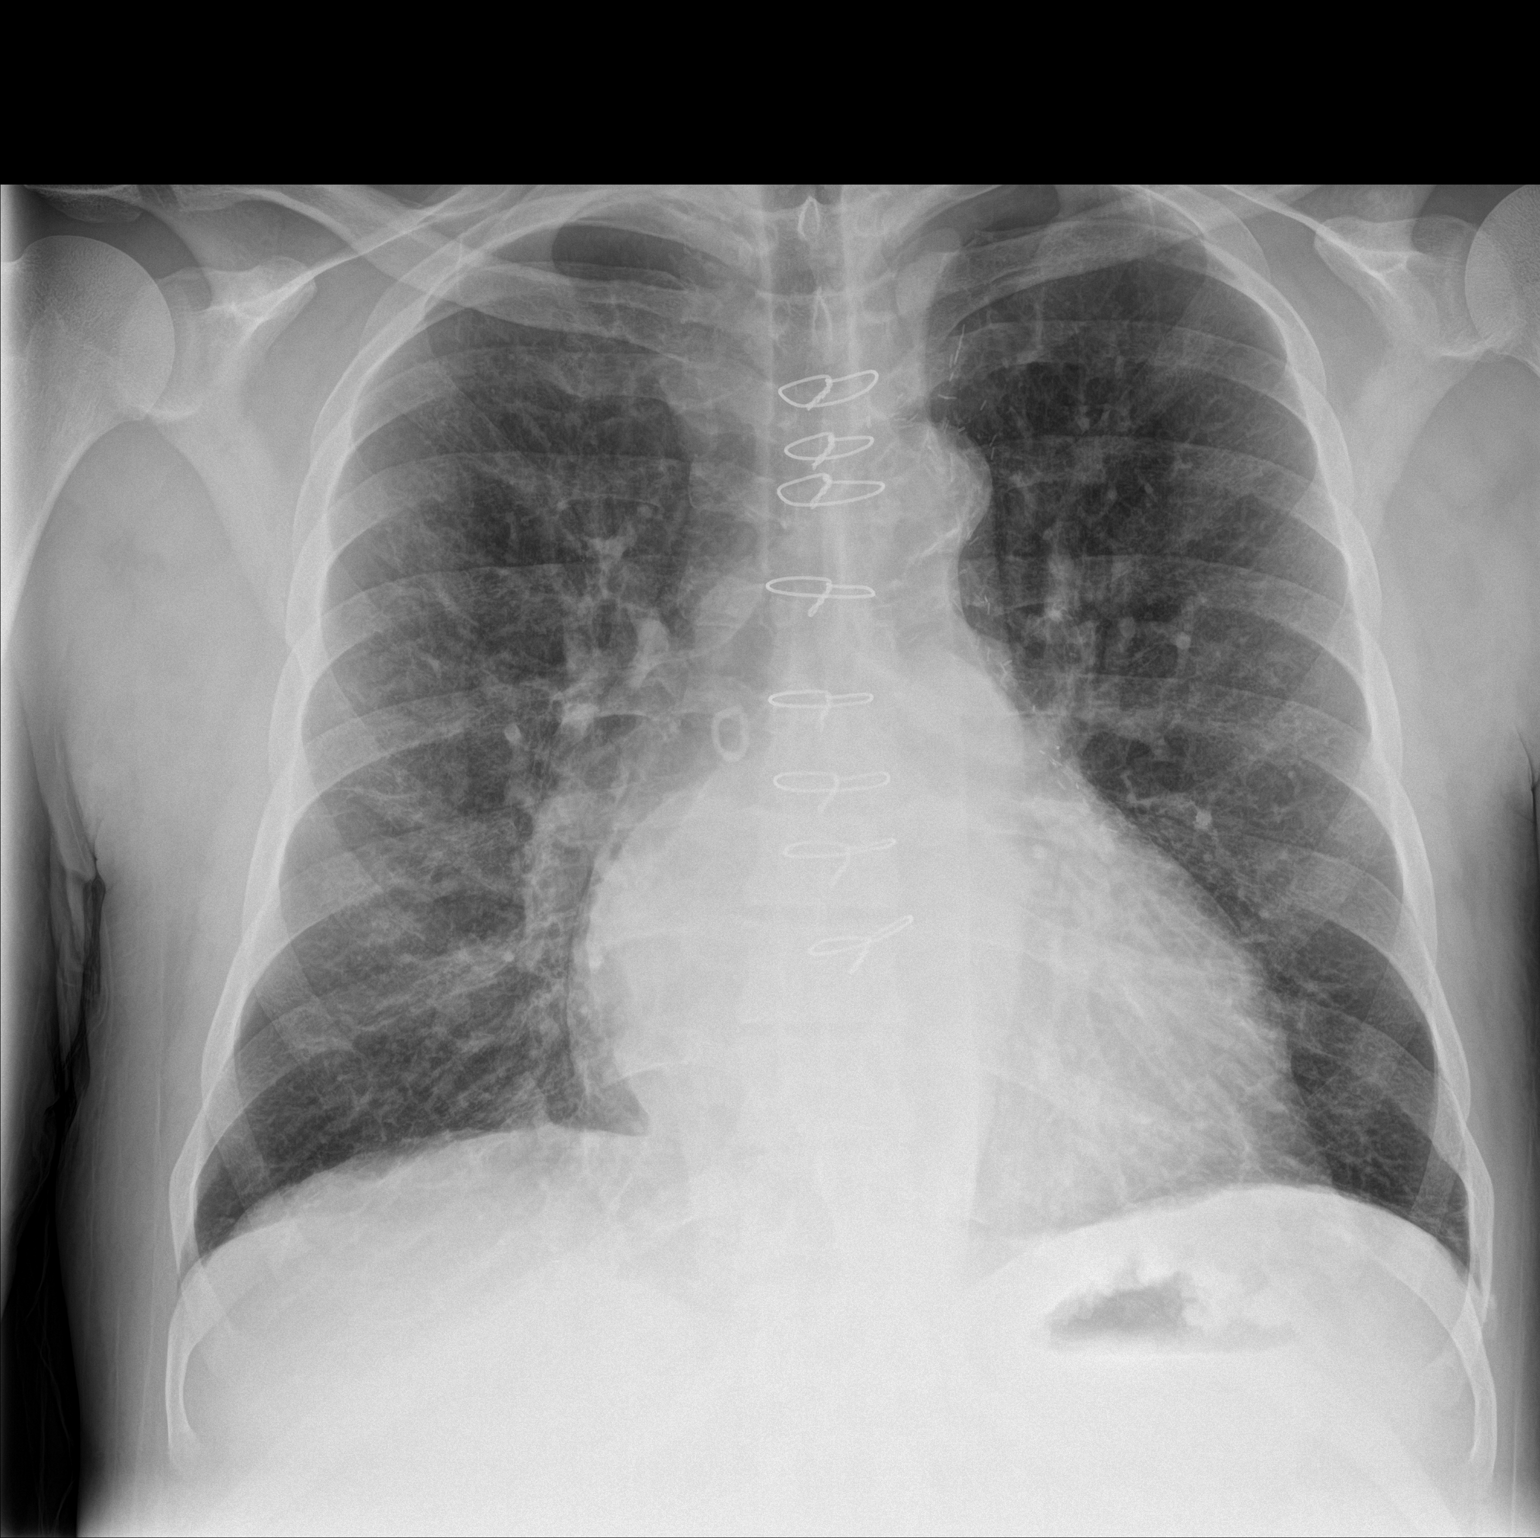

[chest lat]
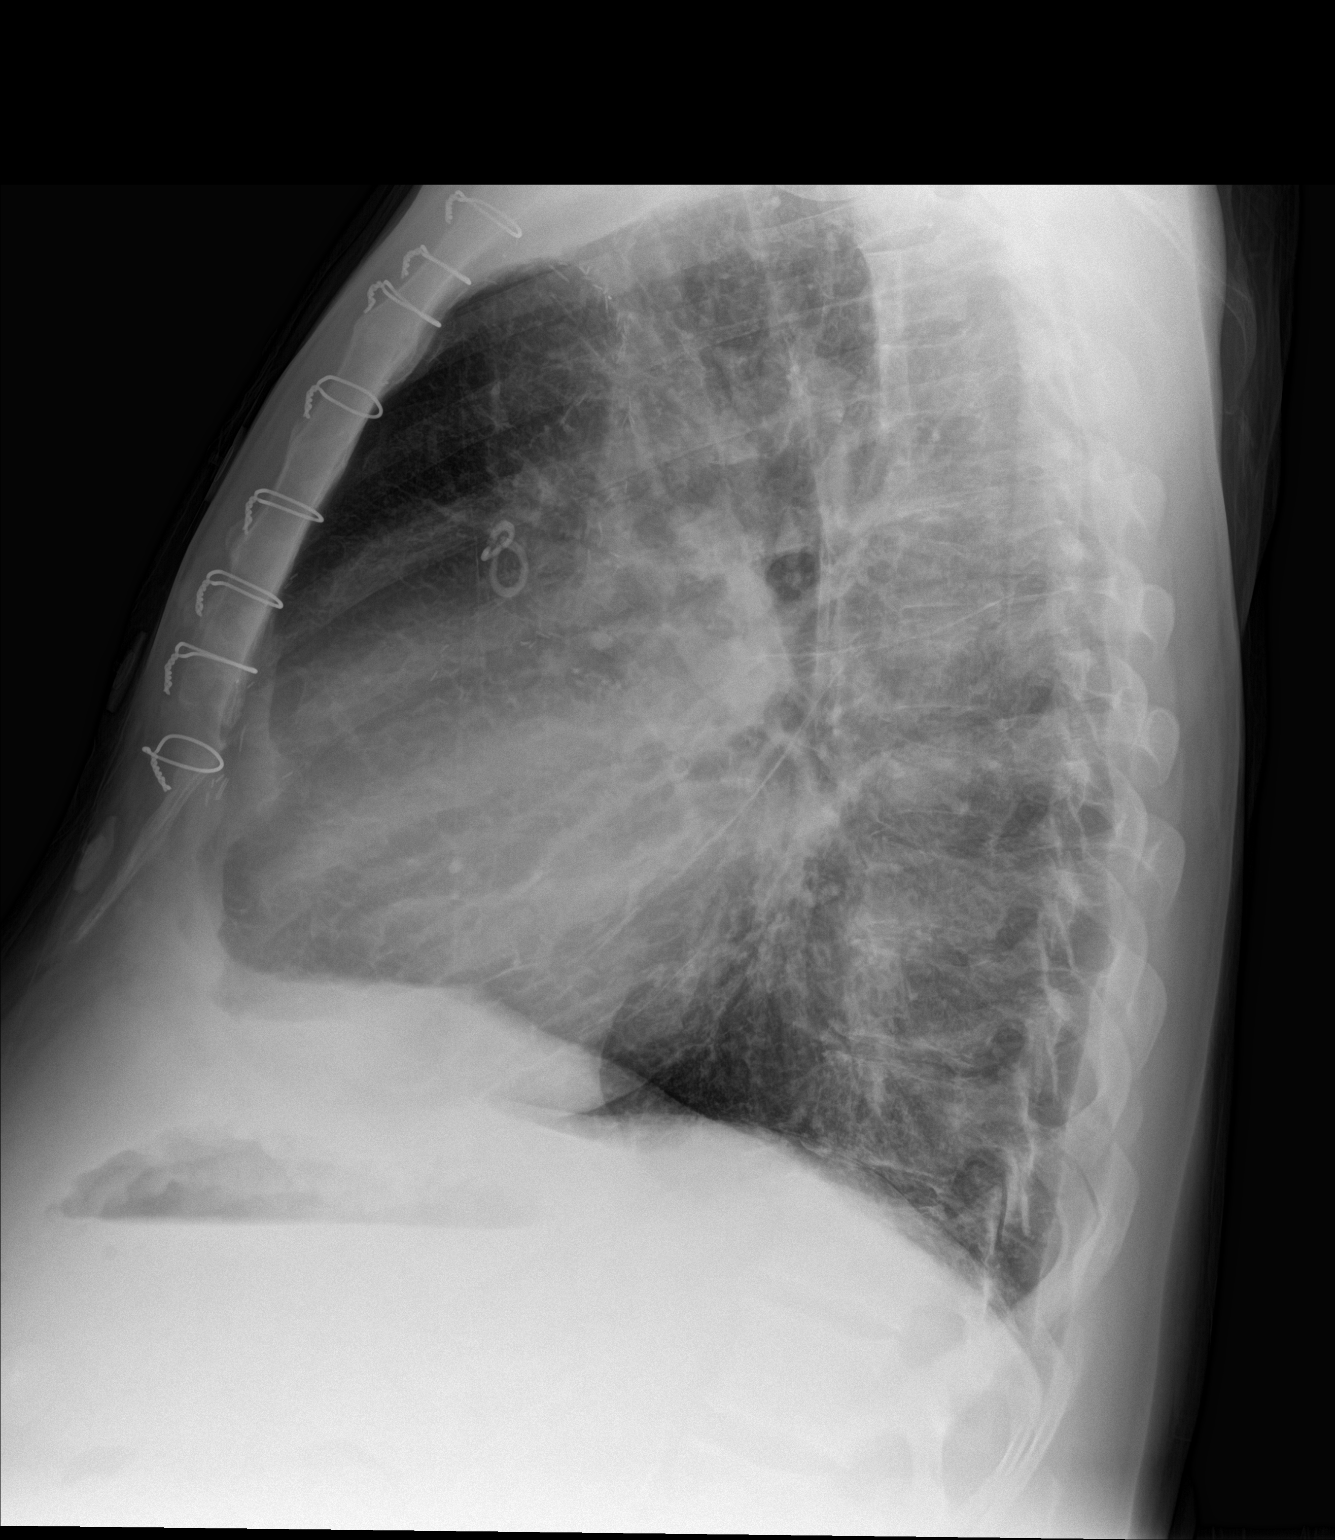

[2 of 2 positions shown; findings below may reference images not displayed]

FINDINGS: The lungs are well-aerated. Vascular congestion is noted. There is
no evidence of focal opacification, pleural effusion or
pneumothorax.

The heart is borderline enlarged. The patient is status post median
sternotomy. No acute osseous abnormalities are seen.
IMPRESSION: Vascular congestion and borderline cardiomegaly. Lungs remain
grossly clear.

## 2017-11-07 IMAGING — US US PARACENTESIS
1 series · 5 of 5 positions shown · non-contrast
Comparison: none

INDICATION: 54-year-old male with recurrent large volume paracentesis

[Series 1: us paracentesis · 0.26mm/px · 5 of 5 slices shown]
[im 1/5]
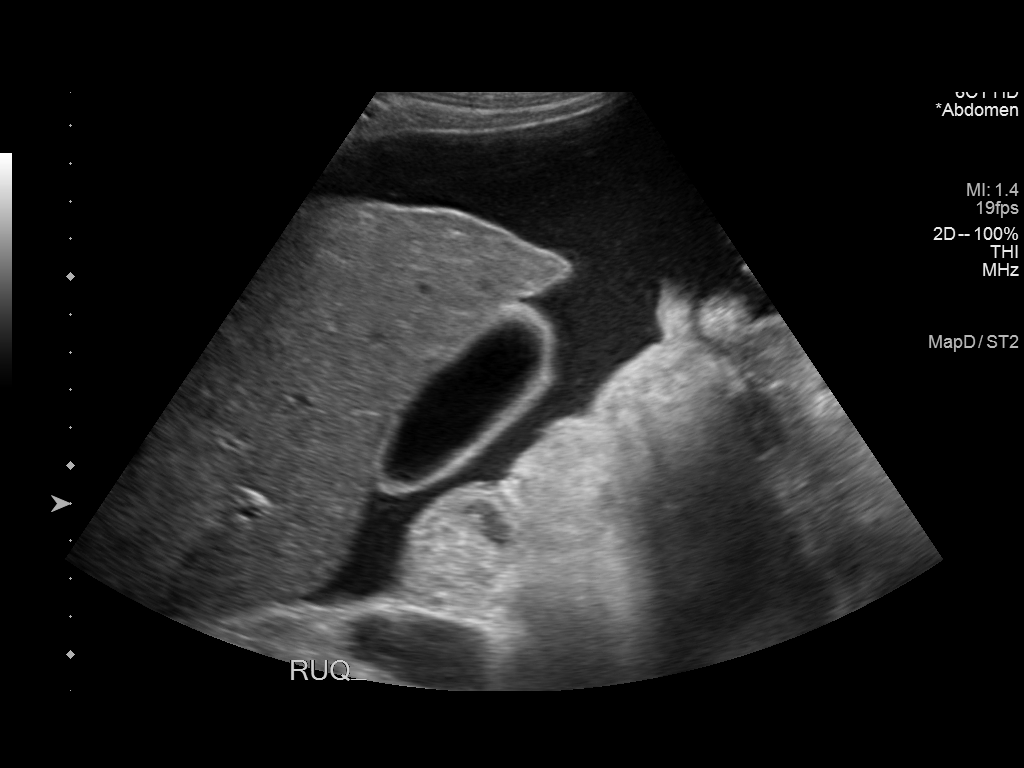
[im 2/5]
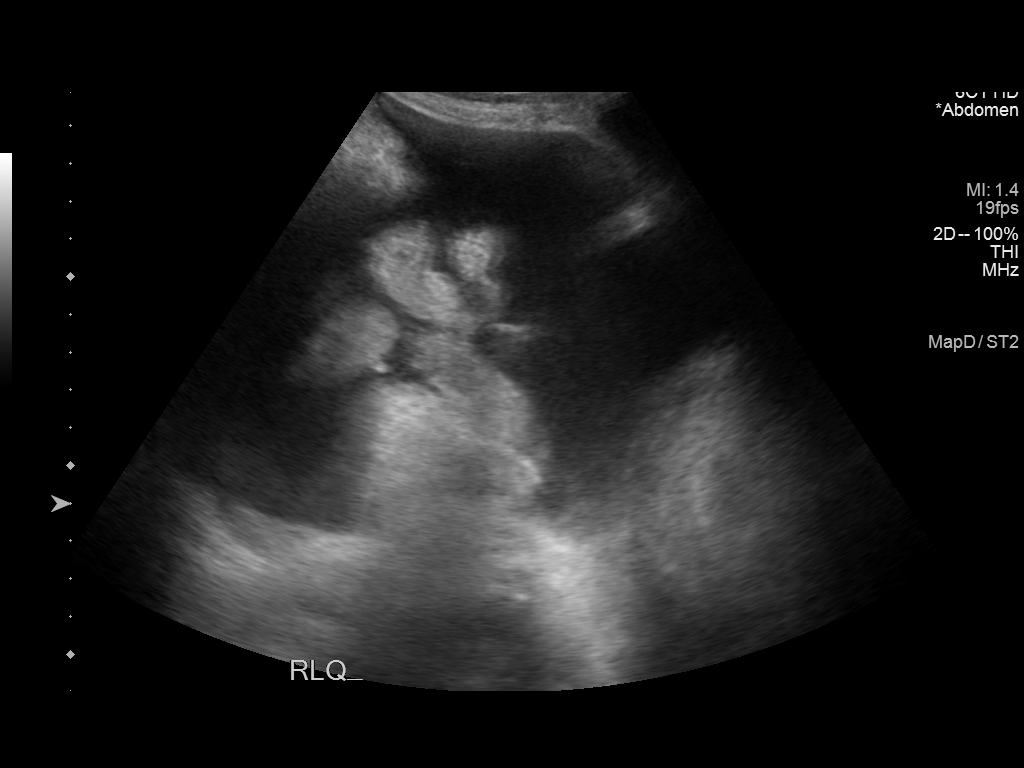
[im 3/5]
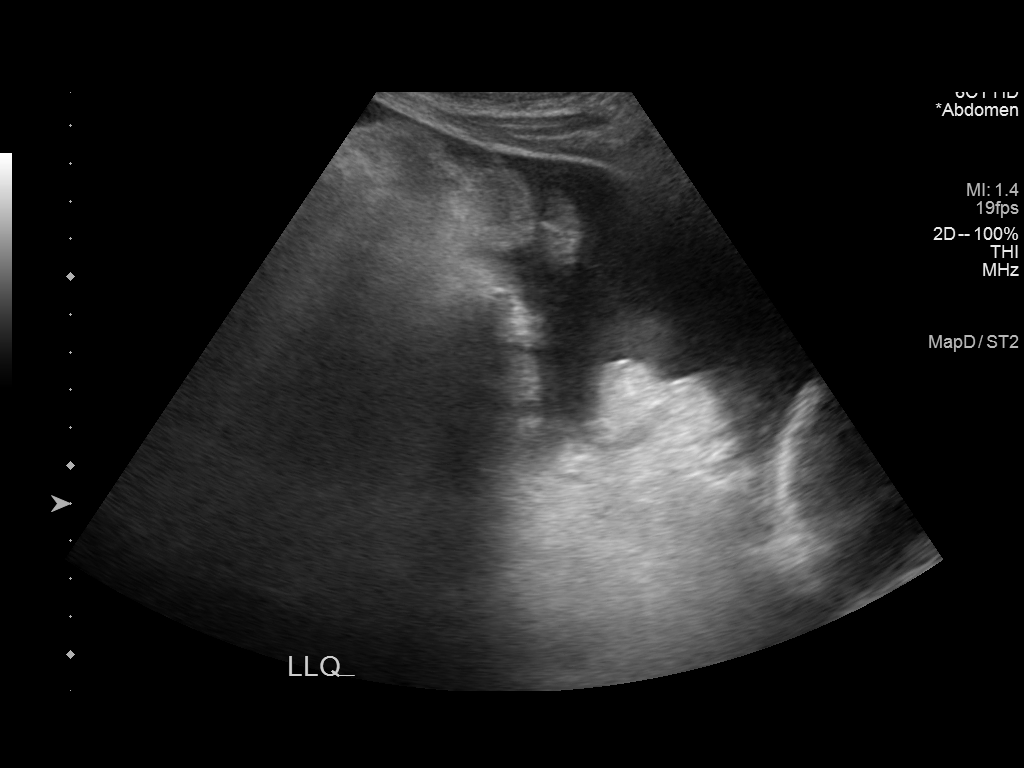
[im 4/5]
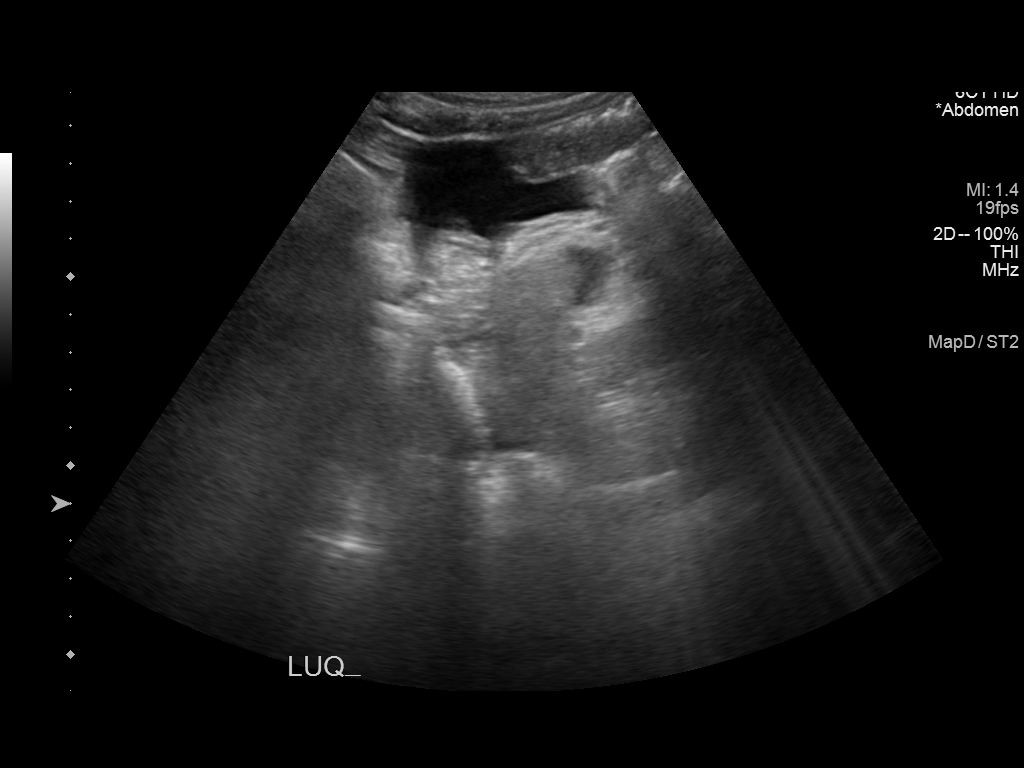
[im 5/5]
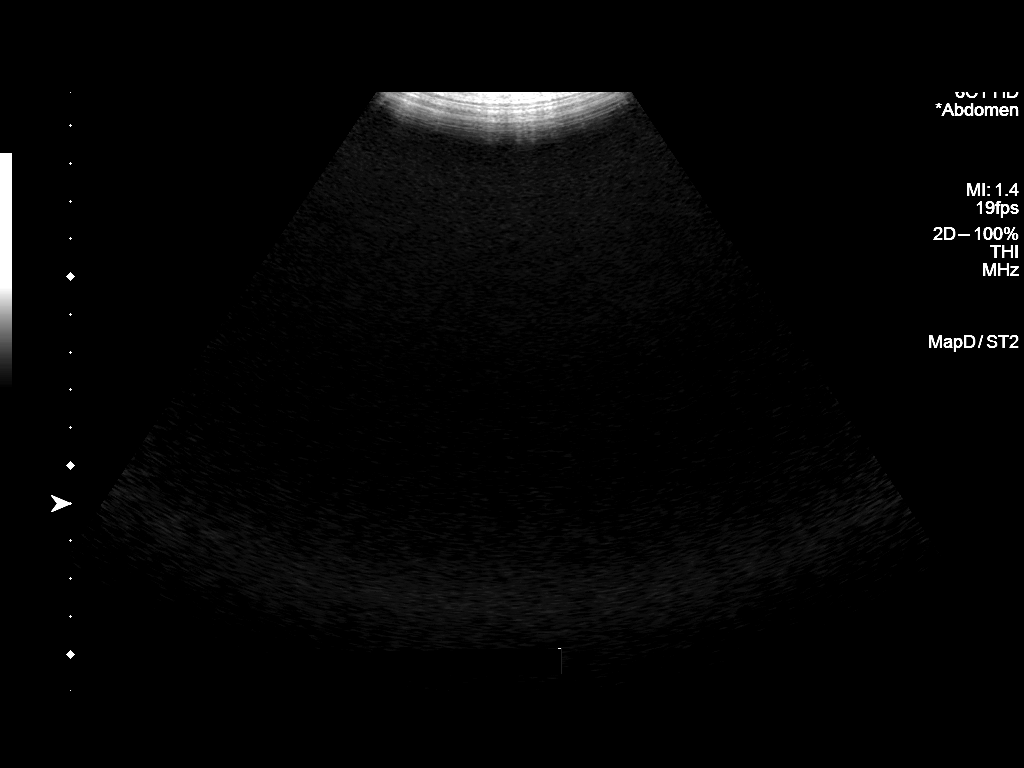

[5 of 5 positions shown; findings below may reference images not displayed]

EXAM:
ULTRASOUND GUIDED  PARACENTESIS

MEDICATIONS:
None.

ANESTHESIA/SEDATION:
None

COMPLICATIONS:
None immediate.

PROCEDURE:
Informed written consent was obtained from the patient after a
discussion of the risks, benefits and alternatives to treatment. A
timeout was performed prior to the initiation of the procedure.

Initial ultrasound scanning demonstrates a large amount of ascites
within the right lower abdominal quadrant. The right lower abdomen
was prepped and draped in the usual sterile fashion. 1% lidocaine
with epinephrine was used for local anesthesia. A 6 French
Safe-T-Centesis catheter was then advanced into the peritoneal
space. An ultrasound image was saved for documentation purposes. The
paracentesis was performed. The catheter was removed and a dressing
was applied. The patient tolerated the procedure well without
immediate post procedural complication.
FINDINGS: A total of approximately 6888 mL of yellow ascitic fluid was
removed. Samples were sent to the laboratory as requested by the
clinical team.
IMPRESSION: Successful ultrasound-guided paracentesis yielding 4 L liters of
peritoneal fluid.

## 2017-11-09 ENCOUNTER — Other Ambulatory Visit: Payer: Self-pay | Admitting: Nephrology

## 2017-11-09 DIAGNOSIS — R188 Other ascites: Secondary | ICD-10-CM

## 2017-11-09 DIAGNOSIS — T782XXA Anaphylactic shock, unspecified, initial encounter: Secondary | ICD-10-CM | POA: Diagnosis not present

## 2017-11-09 DIAGNOSIS — D509 Iron deficiency anemia, unspecified: Secondary | ICD-10-CM | POA: Diagnosis not present

## 2017-11-09 DIAGNOSIS — Z992 Dependence on renal dialysis: Secondary | ICD-10-CM | POA: Diagnosis not present

## 2017-11-09 DIAGNOSIS — N2581 Secondary hyperparathyroidism of renal origin: Secondary | ICD-10-CM | POA: Diagnosis not present

## 2017-11-09 DIAGNOSIS — N186 End stage renal disease: Secondary | ICD-10-CM | POA: Diagnosis not present

## 2017-11-10 ENCOUNTER — Encounter: Payer: Self-pay | Admitting: Emergency Medicine

## 2017-11-10 ENCOUNTER — Emergency Department: Payer: Medicare Other

## 2017-11-10 ENCOUNTER — Other Ambulatory Visit: Payer: Self-pay

## 2017-11-10 ENCOUNTER — Inpatient Hospital Stay
Admission: EM | Admit: 2017-11-10 | Discharge: 2017-11-12 | DRG: 190 | Disposition: A | Discharge status: Home / Self Care | Payer: Medicare Other | Attending: Internal Medicine | Admitting: Internal Medicine

## 2017-11-10 DIAGNOSIS — R188 Other ascites: Secondary | ICD-10-CM | POA: Diagnosis present

## 2017-11-10 DIAGNOSIS — N186 End stage renal disease: Secondary | ICD-10-CM | POA: Diagnosis present

## 2017-11-10 DIAGNOSIS — J441 Chronic obstructive pulmonary disease with (acute) exacerbation: Principal | ICD-10-CM | POA: Diagnosis present

## 2017-11-10 DIAGNOSIS — E875 Hyperkalemia: Secondary | ICD-10-CM | POA: Diagnosis present

## 2017-11-10 DIAGNOSIS — Z992 Dependence on renal dialysis: Secondary | ICD-10-CM

## 2017-11-10 DIAGNOSIS — I132 Hypertensive heart and chronic kidney disease with heart failure and with stage 5 chronic kidney disease, or end stage renal disease: Secondary | ICD-10-CM | POA: Diagnosis present

## 2017-11-10 DIAGNOSIS — E785 Hyperlipidemia, unspecified: Secondary | ICD-10-CM | POA: Diagnosis present

## 2017-11-10 DIAGNOSIS — Z79899 Other long term (current) drug therapy: Secondary | ICD-10-CM

## 2017-11-10 DIAGNOSIS — Z716 Tobacco abuse counseling: Secondary | ICD-10-CM | POA: Diagnosis not present

## 2017-11-10 DIAGNOSIS — J9811 Atelectasis: Secondary | ICD-10-CM | POA: Diagnosis not present

## 2017-11-10 DIAGNOSIS — D631 Anemia in chronic kidney disease: Secondary | ICD-10-CM | POA: Diagnosis present

## 2017-11-10 DIAGNOSIS — Z951 Presence of aortocoronary bypass graft: Secondary | ICD-10-CM | POA: Diagnosis not present

## 2017-11-10 DIAGNOSIS — Z7951 Long term (current) use of inhaled steroids: Secondary | ICD-10-CM

## 2017-11-10 DIAGNOSIS — K7460 Unspecified cirrhosis of liver: Secondary | ICD-10-CM | POA: Diagnosis present

## 2017-11-10 DIAGNOSIS — I120 Hypertensive chronic kidney disease with stage 5 chronic kidney disease or end stage renal disease: Secondary | ICD-10-CM | POA: Diagnosis not present

## 2017-11-10 DIAGNOSIS — I2510 Atherosclerotic heart disease of native coronary artery without angina pectoris: Secondary | ICD-10-CM | POA: Diagnosis present

## 2017-11-10 DIAGNOSIS — I272 Pulmonary hypertension, unspecified: Secondary | ICD-10-CM | POA: Diagnosis present

## 2017-11-10 DIAGNOSIS — Q8781 Alport syndrome: Secondary | ICD-10-CM | POA: Diagnosis not present

## 2017-11-10 DIAGNOSIS — I739 Peripheral vascular disease, unspecified: Secondary | ICD-10-CM | POA: Diagnosis present

## 2017-11-10 DIAGNOSIS — N2581 Secondary hyperparathyroidism of renal origin: Secondary | ICD-10-CM | POA: Diagnosis present

## 2017-11-10 DIAGNOSIS — R0602 Shortness of breath: Secondary | ICD-10-CM | POA: Diagnosis not present

## 2017-11-10 LAB — COMPREHENSIVE METABOLIC PANEL
ALBUMIN: 2.6 g/dL — AB (ref 3.5–5.0)
ALT: 17 U/L (ref 0–44)
AST: 20 U/L (ref 15–41)
Alkaline Phosphatase: 114 U/L (ref 38–126)
Anion gap: 8 (ref 5–15)
BILIRUBIN TOTAL: 0.5 mg/dL (ref 0.3–1.2)
BUN: 51 mg/dL — AB (ref 6–20)
CHLORIDE: 103 mmol/L (ref 98–111)
CO2: 28 mmol/L (ref 22–32)
CREATININE: 9.78 mg/dL — AB (ref 0.61–1.24)
Calcium: 8.5 mg/dL — ABNORMAL LOW (ref 8.9–10.3)
GFR calc Af Amer: 6 mL/min — ABNORMAL LOW (ref >60)
GFR, EST NON AFRICAN AMERICAN: 5 mL/min — AB (ref >60)
GLUCOSE: 119 mg/dL — AB (ref 70–99)
POTASSIUM: 5.8 mmol/L — AB (ref 3.5–5.1)
Sodium: 139 mmol/L (ref 135–145)
Total Protein: 6.3 g/dL — ABNORMAL LOW (ref 6.5–8.1)

## 2017-11-10 LAB — CBC WITH DIFFERENTIAL/PLATELET
BASOS ABS: 0.1 10*3/uL (ref 0–0.1)
BASOS PCT: 1 %
Eosinophils Absolute: 0.1 10*3/uL (ref 0–0.7)
Eosinophils Relative: 2 %
HEMATOCRIT: 27.2 % — AB (ref 40.0–52.0)
Hemoglobin: 8.4 g/dL — ABNORMAL LOW (ref 13.0–18.0)
LYMPHS PCT: 11 %
Lymphs Abs: 0.8 10*3/uL — ABNORMAL LOW (ref 1.0–3.6)
MCH: 22.8 pg — ABNORMAL LOW (ref 26.0–34.0)
MCHC: 30.9 g/dL — ABNORMAL LOW (ref 32.0–36.0)
MCV: 73.6 fL — AB (ref 80.0–100.0)
Monocytes Absolute: 0.9 10*3/uL (ref 0.2–1.0)
Monocytes Relative: 13 %
Neutro Abs: 4.9 10*3/uL (ref 1.4–6.5)
Neutrophils Relative %: 73 %
Platelets: 290 10*3/uL (ref 150–440)
RBC: 3.7 MIL/uL — AB (ref 4.40–5.90)
RDW: 21 % — ABNORMAL HIGH (ref 11.5–14.5)
WBC: 6.8 10*3/uL (ref 3.8–10.6)

## 2017-11-10 LAB — GLUCOSE, CAPILLARY: GLUCOSE-CAPILLARY: 114 mg/dL — AB (ref 70–99)

## 2017-11-10 LAB — TROPONIN I: Troponin I: 0.05 ng/mL (ref <0.03)

## 2017-11-10 LAB — PROTIME-INR
INR: 1.1
PROTHROMBIN TIME: 14.1 s (ref 11.4–15.2)

## 2017-11-10 MED ORDER — AZITHROMYCIN 250 MG PO TABS
250.0000 mg | ORAL_TABLET | Freq: Every day | ORAL | Status: DC
  Administered 2017-11-11 – 2017-11-12 (×2): 250 mg via ORAL
  Filled 2017-11-10 (×2): qty 1

## 2017-11-10 MED ORDER — IPRATROPIUM-ALBUTEROL 0.5-2.5 (3) MG/3ML IN SOLN
9.0000 mL | Freq: Once | RESPIRATORY_TRACT | Status: AC
  Administered 2017-11-10: 9 mL via RESPIRATORY_TRACT
  Filled 2017-11-10: qty 9

## 2017-11-10 MED ORDER — RENA-VITE PO TABS
1.0000 | ORAL_TABLET | Freq: Every day | ORAL | Status: DC
  Administered 2017-11-10 – 2017-11-11 (×2): 1 via ORAL
  Filled 2017-11-10 (×2): qty 1

## 2017-11-10 MED ORDER — TIOTROPIUM BROMIDE MONOHYDRATE 18 MCG IN CAPS
18.0000 ug | ORAL_CAPSULE | Freq: Every day | RESPIRATORY_TRACT | Status: DC
  Administered 2017-11-11 – 2017-11-12 (×2): 18 ug via RESPIRATORY_TRACT
  Filled 2017-11-10: qty 5

## 2017-11-10 MED ORDER — DIPHENHYDRAMINE HCL 50 MG/ML IJ SOLN
12.5000 mg | Freq: Once | INTRAMUSCULAR | Status: AC
  Administered 2017-11-10: 12.5 mg via INTRAVENOUS
  Filled 2017-11-10: qty 1

## 2017-11-10 MED ORDER — PROMETHAZINE HCL 25 MG/ML IJ SOLN
INTRAMUSCULAR | Status: AC
  Administered 2017-11-10: 12.5 mg via INTRAVENOUS
  Filled 2017-11-10: qty 1

## 2017-11-10 MED ORDER — FOLIC ACID 1 MG PO TABS
1.0000 mg | ORAL_TABLET | Freq: Every day | ORAL | Status: DC
  Administered 2017-11-11 – 2017-11-12 (×2): 1 mg via ORAL
  Filled 2017-11-10 (×2): qty 1

## 2017-11-10 MED ORDER — INSULIN ASPART 100 UNIT/ML IV SOLN
10.0000 [IU] | Freq: Once | INTRAVENOUS | Status: AC
  Administered 2017-11-10: 10 [IU] via INTRAVENOUS
  Filled 2017-11-10: qty 0.1

## 2017-11-10 MED ORDER — PANTOPRAZOLE SODIUM 40 MG PO TBEC
40.0000 mg | DELAYED_RELEASE_TABLET | Freq: Every day | ORAL | Status: DC
  Administered 2017-11-11 – 2017-11-12 (×2): 40 mg via ORAL
  Filled 2017-11-10 (×2): qty 1

## 2017-11-10 MED ORDER — CALCIUM ACETATE (PHOS BINDER) 667 MG PO CAPS
2001.0000 mg | ORAL_CAPSULE | Freq: Three times a day (TID) | ORAL | Status: DC
  Administered 2017-11-11: 2001 mg via ORAL
  Filled 2017-11-10 (×3): qty 3

## 2017-11-10 MED ORDER — LABETALOL HCL 100 MG PO TABS
100.0000 mg | ORAL_TABLET | Freq: Every day | ORAL | Status: DC
  Administered 2017-11-11: 100 mg via ORAL
  Filled 2017-11-10: qty 1

## 2017-11-10 MED ORDER — IPRATROPIUM-ALBUTEROL 0.5-2.5 (3) MG/3ML IN SOLN
3.0000 mL | Freq: Four times a day (QID) | RESPIRATORY_TRACT | Status: DC
  Administered 2017-11-11 – 2017-11-12 (×4): 3 mL via RESPIRATORY_TRACT
  Filled 2017-11-10 (×5): qty 3

## 2017-11-10 MED ORDER — METHYLPREDNISOLONE SODIUM SUCC 125 MG IJ SOLR
125.0000 mg | Freq: Once | INTRAMUSCULAR | Status: AC
  Administered 2017-11-10: 125 mg via INTRAVENOUS
  Filled 2017-11-10: qty 2

## 2017-11-10 MED ORDER — SODIUM CHLORIDE 0.9 % IV SOLN
1.0000 g | Freq: Once | INTRAVENOUS | Status: AC
  Administered 2017-11-10: 1 g via INTRAVENOUS
  Filled 2017-11-10 (×2): qty 10

## 2017-11-10 MED ORDER — DEXTROSE 50 % IV SOLN
25.0000 g | Freq: Once | INTRAVENOUS | Status: AC
  Administered 2017-11-10: 25 g via INTRAVENOUS
  Filled 2017-11-10: qty 50

## 2017-11-10 MED ORDER — SODIUM BICARBONATE 8.4 % IV SOLN
25.0000 meq | Freq: Once | INTRAVENOUS | Status: AC
  Administered 2017-11-10: 25 meq via INTRAVENOUS
  Filled 2017-11-10: qty 50

## 2017-11-10 MED ORDER — FUROSEMIDE 10 MG/ML IJ SOLN
100.0000 mg | Freq: Once | INTRAVENOUS | Status: AC
  Administered 2017-11-10: 100 mg via INTRAVENOUS
  Filled 2017-11-10 (×2): qty 10

## 2017-11-10 MED ORDER — PROMETHAZINE HCL 25 MG/ML IJ SOLN
12.5000 mg | Freq: Once | INTRAMUSCULAR | Status: AC
  Administered 2017-11-10: 12.5 mg via INTRAVENOUS

## 2017-11-10 MED ORDER — SODIUM CHLORIDE 0.9 % IV SOLN
500.0000 mg | Freq: Once | INTRAVENOUS | Status: AC
  Administered 2017-11-10: 500 mg via INTRAVENOUS
  Filled 2017-11-10: qty 500

## 2017-11-10 MED ORDER — GABAPENTIN 600 MG PO TABS
ORAL_TABLET | ORAL | Status: AC
  Filled 2017-11-10: qty 1

## 2017-11-10 MED ORDER — METHYLPREDNISOLONE SODIUM SUCC 40 MG IJ SOLR
40.0000 mg | Freq: Two times a day (BID) | INTRAMUSCULAR | Status: DC
  Administered 2017-11-11 – 2017-11-12 (×3): 40 mg via INTRAVENOUS
  Filled 2017-11-10 (×3): qty 1

## 2017-11-10 MED ORDER — BUDESONIDE 0.5 MG/2ML IN SUSP
0.5000 mg | Freq: Two times a day (BID) | RESPIRATORY_TRACT | Status: DC
  Administered 2017-11-11 – 2017-11-12 (×2): 0.5 mg via RESPIRATORY_TRACT
  Filled 2017-11-10 (×3): qty 2

## 2017-11-10 MED ORDER — NITROGLYCERIN 0.4 MG SL SUBL: 0.4000 mg | SUBLINGUAL_TABLET | SUBLINGUAL | Status: DC | PRN

## 2017-11-10 MED ORDER — GABAPENTIN 300 MG PO CAPS
300.0000 mg | ORAL_CAPSULE | Freq: Two times a day (BID) | ORAL | Status: DC
  Administered 2017-11-10 – 2017-11-12 (×4): 300 mg via ORAL
  Filled 2017-11-10 (×4): qty 1

## 2017-11-10 MED ORDER — GI COCKTAIL ~~LOC~~
30.0000 mL | ORAL | Status: AC
  Administered 2017-11-11: 30 mL via ORAL
  Filled 2017-11-10: qty 30

## 2017-11-10 MED ORDER — FERROUS SULFATE 325 (65 FE) MG PO TABS
325.0000 mg | ORAL_TABLET | Freq: Three times a day (TID) | ORAL | Status: DC
  Administered 2017-11-10 – 2017-11-12 (×5): 325 mg via ORAL
  Filled 2017-11-10 (×5): qty 1

## 2017-11-10 MED ORDER — B COMPLEX-C PO TABS
1.0000 | ORAL_TABLET | Freq: Every day | ORAL | Status: DC
  Administered 2017-11-11 – 2017-11-12 (×2): 1 via ORAL
  Filled 2017-11-10 (×2): qty 1

## 2017-11-10 MED ORDER — SODIUM ZIRCONIUM CYCLOSILICATE 5 G PO PACK
5.0000 g | PACK | ORAL | Status: AC
  Administered 2017-11-11: 5 g via ORAL
  Filled 2017-11-10: qty 1

## 2017-11-10 NOTE — ED Notes (Signed)
Alma Friendly, RN to transport pt to 2A-[id number]. Floor aware pt is en route.

## 2017-11-10 NOTE — ED Triage Notes (Signed)
Pt here via ems for respiratory distress. Pt t,th,s dialysis pt with hx of ascites. Pt asking to have abd tapped to decrease fluid and pressure.

## 2017-11-10 NOTE — ED Notes (Signed)
Admitting MD, Dr. Earleen Newport, at bedside at this time.

## 2017-11-10 NOTE — Progress Notes (Signed)
Spoke with Roma Schanz, RN regarding consult order placed for 2nd IV site. Explained that at the moment there is only med orders for one time dose of lasix and calcium gluconate. No further IV meds ordered to give at this time per RN. Pt only needs 1 IV at this time. RN to place another consult if more meds are ordered, or pt loses IV site.  Randol Kern, RN VAST Team

## 2017-11-10 NOTE — ED Notes (Signed)
Pt removed nasal cannula because he "didn't need it anymore."

## 2017-11-10 NOTE — Progress Notes (Addendum)
Pt complaints of indigestion. Page prime. Will continue to monitor.  Update 2320: Pt only have one IV. IV Team consulted. Will continue to monitor.  Update 2335: Talked to IV team melissa and states that at this time pt wont need another IV. Melissa states that if pt needs it, just give them a call.  Update 2336: Doctor Duane Boston ordered GI cocktail oral STAT at 2336: Will continue to monitor.  Update 2358: Pt states that gabapentin didn't help much with his leg pain. Notify prime. Will continue to monitor.  Update 0114: Doctor Duane Boston ordered one time dose of ketorolac 30 mg/ml. Will continue to monitor.  Update 0430: Pt have no DVT prophylasix. Notify Prime. Will continue to monitor.  Update 0431: Doctor Tilford Pillar place an order for Hep injections 5000 units. Will continue to monitor.

## 2017-11-10 NOTE — Progress Notes (Signed)
Patient ID: Stanley [name], male   DOB: 12/28/1960, 57 y.o.   MRN: [id number]  ACP note Patient present  Diagnosis:COPD exacerbation, hyperkalemia, end-stage renal disease on hemodialysis, cirrhosis with ascites, anemia, history of drug abuse, history of diastolic congestive heart failure and fluid overload  CODE STATUS discussed.  Patient wishes to be a full code  Plan.  Treat COPD exacerbation with Solu-Medrol nebulizer treatments.  Treat hyperkalemia medically for now and dialysis tomorrow morning.  With the holiday weekend not sure if I will be able to get a paracentesis done on this hospital stay.  Time spent on ACP discussion 17 minutes Dr. Loletha Grayer

## 2017-11-10 NOTE — H&P (Signed)
Stanley [name] at [place] NAME: Stanley [name]    MR#:  [id number]  DATE OF BIRTH:  1960/08/13  DATE OF ADMISSION:  11/10/2017  PRIMARY CARE PHYSICIAN: Alene Mires Elyse Jarvis, MD   REQUESTING/REFERRING PHYSICIAN: Dr Clearnce Hasten  CHIEF COMPLAINT:   Chief Complaint  Patient presents with  . Respiratory Distress    HISTORY OF PRESENT ILLNESS:  Stanley [name]  is a 57 y.o. male with a known history of COPD, end-stage renal disease presents with shortness of breath.  Patient states that he has more fluid on him.  He cannot breathe for the last 2 days.  He states his foot is hurting.  States he is itching all over and wants some Benadryl.  He states his abdomen is bigger than normal.  Hospitalist services were contacted for admission.  PAST MEDICAL HISTORY:   Past Medical History:  Diagnosis Date  . Alcohol abuse   . CHF (congestive heart failure) ([place])   . Cirrhosis ([place])   . Coronary artery disease 2009  . Drug abuse ([place])   . End stage renal disease on dialysis [facility]) NEPHROLOGIST-   [facility]  IN [place]   HEMODIALYSIS --   TUES/  THURS/  SAT  . Gastrointestinal bleed 06/13/2017   From chart...hx of multiple GI bleeds  . GERD (gastroesophageal reflux disease)   . Hyperlipidemia   . Hypertension   . PAD (peripheral artery disease) ([place])   . Renal insufficiency    Per pt, 32 oz fluid restriction per day  . S/P triple vessel bypass 06/09/2016   2009ish  . Suicidal ideation    & HOMICIDAL IDEATION --  06-16-2013   ADMITTED TO BEHAVIOR HEALTH    PAST SURGICAL HISTORY:   Past Surgical History:  Procedure Laterality Date  . A/V FISTULAGRAM Right 06/06/2017   Procedure: A/V FISTULAGRAM;  Surgeon: Katha Cabal, MD;  Location: [place] CV LAB;  Service: Cardiovascular;  Laterality: Right;  . A/V SHUNT INTERVENTION N/A 06/06/2017   Procedure: A/V SHUNT INTERVENTION;  Surgeon: Katha Cabal, MD;  Location: [place] CV LAB;  Service: Cardiovascular;  Laterality: N/A;  . AGILE CAPSULE N/A 06/19/2016   Procedure: AGILE CAPSULE;  Surgeon: [name], MD;  Location: [facility] ENDOSCOPY;  Service: Endoscopy;  Laterality: N/A;  . COLONOSCOPY WITH PROPOFOL N/A 06/18/2016   Procedure: COLONOSCOPY WITH PROPOFOL;  Surgeon: [name], MD;  Location: [facility] ENDOSCOPY;  Service: Endoscopy;  Laterality: N/A;  . COLONOSCOPY WITH PROPOFOL N/A 08/12/2016   Procedure: COLONOSCOPY WITH PROPOFOL;  Surgeon: [name], MD;  Location: [facility] ENDOSCOPY;  Service: Endoscopy;  Laterality: N/A;  . COLONOSCOPY WITH PROPOFOL N/A 05/05/2017   Procedure: COLONOSCOPY WITH PROPOFOL;  Surgeon: Manya Silvas, MD;  Location: [facility] ENDOSCOPY;  Service: Endoscopy;  Laterality: N/A;  . CORONARY ANGIOPLASTY  ?   PT UNABLE TO TELL IF  BEFORE OR AFTER  CABG  . CORONARY ARTERY BYPASS GRAFT  2008  ([place] , [place])   3 VESSEL  . DIALYSIS FISTULA CREATION  LAST SURGERY  APPOX  2008  . ENTEROSCOPY N/A 05/10/2016   Procedure: ENTEROSCOPY;  Surgeon: Jerene Bears, MD;  Location: [place];  Service: Gastroenterology;  Laterality: N/A;  . ENTEROSCOPY N/A 08/12/2016   Procedure: ENTEROSCOPY;  Surgeon: [name], MD;  Location: [facility] ENDOSCOPY;  Service: Endoscopy;  Laterality: N/A;  . ENTEROSCOPY Left 06/03/2017   Procedure: ENTEROSCOPY;  Surgeon: Virgel Manifold, MD;  Location: [facility] ENDOSCOPY;  Service: Endoscopy;  Laterality: Left;  Procedure date will ultimately depend on when patient is medically optimized before the procedure, pending hemodialysis and blood transfusions etc. Will place on schedule and change depending on clinical status.   . ENTEROSCOPY N/A 06/05/2017   Procedure: ENTEROSCOPY;  Surgeon: Virgel Manifold, MD;  Location: [facility] ENDOSCOPY;  Service: Endoscopy;  Laterality: N/A;  . ENTEROSCOPY N/A 06/15/2017   Procedure: Push ENTEROSCOPY;  Surgeon: [name], MD;  Location: [facility] ENDOSCOPY;  Service: Endoscopy;  Laterality: N/A;  .  ESOPHAGOGASTRODUODENOSCOPY N/A 05/07/2015   Procedure: ESOPHAGOGASTRODUODENOSCOPY (EGD);  Surgeon: Hulen Luster, MD;  Location: [facility] ENDOSCOPY;  Service: Endoscopy;  Laterality: N/A;  . ESOPHAGOGASTRODUODENOSCOPY (EGD) WITH PROPOFOL N/A 05/17/2015   Procedure: ESOPHAGOGASTRODUODENOSCOPY (EGD) WITH PROPOFOL;  Surgeon: [name], MD;  Location: [facility] ENDOSCOPY;  Service: Endoscopy;  Laterality: N/A;  . ESOPHAGOGASTRODUODENOSCOPY (EGD) WITH PROPOFOL N/A 01/20/2016   Procedure: ESOPHAGOGASTRODUODENOSCOPY (EGD) WITH PROPOFOL;  Surgeon: [name], MD;  Location: [facility] ENDOSCOPY;  Service: Endoscopy;  Laterality: N/A;  . ESOPHAGOGASTRODUODENOSCOPY (EGD) WITH PROPOFOL N/A 04/17/2016   Procedure: ESOPHAGOGASTRODUODENOSCOPY (EGD) WITH PROPOFOL;  Surgeon: Lin Landsman, MD;  Location: [facility] ENDOSCOPY;  Service: Gastroenterology;  Laterality: N/A;  . ESOPHAGOGASTRODUODENOSCOPY (EGD) WITH PROPOFOL  05/09/2016   Procedure: ESOPHAGOGASTRODUODENOSCOPY (EGD) WITH PROPOFOL;  Surgeon: Jerene Bears, MD;  Location: [place];  Service: Endoscopy;;  . ESOPHAGOGASTRODUODENOSCOPY (EGD) WITH PROPOFOL N/A 06/16/2016   Procedure: ESOPHAGOGASTRODUODENOSCOPY (EGD) WITH PROPOFOL;  Surgeon: [name], MD;  Location: [facility] ENDOSCOPY;  Service: Endoscopy;  Laterality: N/A;  . ESOPHAGOGASTRODUODENOSCOPY (EGD) WITH PROPOFOL N/A 05/05/2017   Procedure: ESOPHAGOGASTRODUODENOSCOPY (EGD) WITH PROPOFOL;  Surgeon: Manya Silvas, MD;  Location: [facility] ENDOSCOPY;  Service: Endoscopy;  Laterality: N/A;  . ESOPHAGOGASTRODUODENOSCOPY (EGD) WITH PROPOFOL N/A 06/15/2017   Procedure: ESOPHAGOGASTRODUODENOSCOPY (EGD) WITH PROPOFOL;  Surgeon: [name], MD;  Location: [facility] ENDOSCOPY;  Service: Endoscopy;  Laterality: N/A;  . GIVENS CAPSULE STUDY N/A 05/07/2016   Procedure: GIVENS CAPSULE STUDY;  Surgeon: Doran Stabler, MD;  Location: [place];  Service: Endoscopy;  Laterality: N/A;  . MANDIBULAR HARDWARE REMOVAL N/A 07/29/2013   Procedure:  REMOVAL OF ARCH BARS;  Surgeon: Theodoro Kos, DO;  Location: [place];  Service: Plastics;  Laterality: N/A;  . ORIF MANDIBULAR FRACTURE N/A 06/05/2013   Procedure: REPAIR OF MANDIBULAR FRACTURE x 2 with maxillo-mandibular fixation ;  Surgeon: Theodoro Kos, DO;  Location: [place];  Service: Plastics;  Laterality: N/A;  . PARACENTESIS    . PERIPHERAL ARTERIAL STENT GRAFT Left     SOCIAL HISTORY:   Social History   Tobacco Use  . Smoking status: Current Every Day Smoker    Packs/day: 0.15    Years: 40.00    Pack years: 6.00    Types: Cigarettes  . Smokeless tobacco: Never Used  Substance Use Topics  . Alcohol use: No    Comment: pt reports quitting after learning about cirrhosis    FAMILY HISTORY:   Family History  Problem Relation Age of Onset  . Colon cancer Mother   . Cancer Father   . Cancer Sister   . Kidney disease Brother     DRUG ALLERGIES:  No Known Allergies  REVIEW OF SYSTEMS:  CONSTITUTIONAL: No fever.  Positive for chills.  Positive for fatigue and can hardly walk.  EYES: No blurred or double vision.  EARS, NOSE, AND THROAT: No tinnitus or ear pain. No sore throat RESPIRATORY: Positive for cough, shortness of breath, and wheezing.  No hemoptysis.  CARDIOVASCULAR: No  chest pain, orthopnea, edema.  GASTROINTESTINAL: Positive for nausea, and abdominal pain. No blood in bowel movements.  No vomiting. GENITOURINARY: No dysuria, hematuria.  ENDOCRINE: No polyuria, nocturia,  HEMATOLOGY: No anemia, easy bruising or bleeding SKIN: No rash or lesion. MUSCULOSKELETAL: Chronic foot pain NEUROLOGIC: No tingling, numbness, weakness.  PSYCHIATRY: No anxiety or depression.   MEDICATIONS AT HOME:   Prior to Admission medications   Medication Sig Start Date End Date Taking? Authorizing Provider  albuterol (PROVENTIL HFA;VENTOLIN HFA) 108 (90 Base) MCG/ACT inhaler Inhale 2 puffs into the lungs every 6 (six) hours as needed for wheezing or shortness of  breath. 04/21/17  Yes Lisa Roca, MD  B Complex-C-Zn-Folic Acid (DIALYVITE/ZINC) TABS Take 1 tablet by mouth daily. 09/08/17  Yes [provider]  budesonide-formoterol (SYMBICORT) 160-4.5 MCG/ACT inhaler Inhale 2 puffs into the lungs daily. 11/16/16  Yes Vaughan Basta, MD  calcium acetate (PHOSLO) 667 MG capsule Take 2,001 mg by mouth 3 (three) times daily.   Yes [provider]  ferrous sulfate 325 (65 FE) MG tablet Take 1 tablet by mouth 3 (three) times daily. 01/21/17  Yes [provider]  folic acid (FOLVITE) 1 MG tablet Take 1 tablet (1 mg total) by mouth daily. 10/31/17  Yes Fritzi Mandes, MD  gabapentin (NEURONTIN) 300 MG capsule Take 300 mg by mouth 2 (two) times daily.  08/16/16  Yes [provider]  ipratropium-albuterol (DUONEB) 0.5-2.5 (3) MG/3ML SOLN Take 3 mLs by nebulization every 6 (six) hours as needed. 11/28/16  Yes Earle Troiano, MD  labetalol (NORMODYNE) 100 MG tablet Take 100 mg by mouth daily. 03/24/17  Yes [provider]  multivitamin (RENA-VIT) TABS tablet Take 1 tablet by mouth at bedtime. 10/31/17  Yes Fritzi Mandes, MD  nitroGLYCERIN (NITROSTAT) 0.4 MG SL tablet Place 1 tablet (0.4 mg total) under the tongue every 5 (five) minutes as needed. 04/12/16  Yes Wende Bushy, MD  omeprazole (PRILOSEC) 40 MG capsule Take 40 mg by mouth daily. 09/08/17  Yes [provider]  Spacer/Aero Chamber Mouthpiece MISC 1 Units by Does not apply route every 4 (four) hours as needed (wheezing). 02/19/17  Yes Darel Hong, MD  tiotropium (SPIRIVA HANDIHALER) 18 MCG inhalation capsule Place 1 capsule (18 mcg total) into inhaler and inhale daily. 11/16/16 11/16/17 Yes Vaughan Basta, MD      VITAL SIGNS:  Blood pressure 138/82, pulse 85, temperature 98.4 F (36.9 C), temperature source Oral, resp. rate 19, height 6\' 3"  (1.905 m), weight 82 kg, SpO2 99 %.  PHYSICAL EXAMINATION:  GENERAL:  57 y.o.-year-old patient lying in the bed  with no acute distress.  EYES: Pupils equal, round, reactive to light and accommodation. No scleral icterus. Extraocular muscles intact.  HEENT: Head atraumatic, normocephalic. Oropharynx and nasopharynx clear.  NECK:  Supple, no jugular venous distention. No thyroid enlargement, no tenderness.  LUNGS: Normal breath sounds bilaterally, no wheezing, rales,rhonchi or crepitation. No use of accessory muscles of respiration.  CARDIOVASCULAR: S1, S2 normal. No murmurs, rubs, or gallops.  ABDOMEN: Soft, nontender, distended. Bowel sounds present. No organomegaly or mass.  EXTREMITIES: Trace pedal edema, no cyanosis, or clubbing.  NEUROLOGIC: Cranial nerves II through XII are intact. Muscle strength 5/5 in all extremities. Sensation intact. Gait not checked.  PSYCHIATRIC: The patient is alert and oriented x 3.  SKIN: No rash, lesion, or ulcer.   LABORATORY PANEL:   CBC Recent Labs  Lab 11/10/17 1855  WBC 6.8  HGB 8.4*  HCT 27.2*  PLT 290   ------------------------------------------------------------------------------------------------------------------  Chemistries  Recent Labs  Lab 11/10/17 1855  NA 139  K 5.8*  CL 103  CO2 28  GLUCOSE 119*  BUN 51*  CREATININE 9.78*  CALCIUM 8.5*  AST 20  ALT 17  ALKPHOS 114  BILITOT 0.5   ------------------------------------------------------------------------------------------------------------------  Cardiac Enzymes Recent Labs  Lab 11/10/17 1855  TROPONINI 0.05*   ------------------------------------------------------------------------------------------------------------------  RADIOLOGY:  Dg Chest 1 View  Result Date: 11/10/2017 CLINICAL DATA:  Shortness of breath EXAM: CHEST  1 VIEW COMPARISON:  Eighteen 19 FINDINGS: Unchanged position of right chest wall hemodialysis catheter. Remote CABG. Moderate cardiomegaly is unchanged. There is bibasilar atelectasis. No focal airspace consolidation or pulmonary edema. IMPRESSION: 1.  Bibasilar atelectasis without focal consolidation. 2. Unchanged cardiomegaly with aortic atherosclerosis (ICD10-I70.0). Electronically Signed   By: Ulyses Jarred M.D.   On: 11/10/2017 18:50    EKG:   Normal sinus rhythm 79 bpm, LVH  IMPRESSION AND PLAN:   1.  COPD exacerbation.  Solu-Medrol ordered.  Give budesonide and DuoNeb nebulizer solution.  Tobacco abuse smoking cessation counseling done 4 minutes by me.  Empiric Zithromax. 2.  Hyperkalemia.  Give lokelma, Lasix, insulin, D50, bicarb and calcium.  Case discussed with nephrology and they will do dialysis tomorrow morning. 3.  [name] via the ER.  Last dialysis as per patient on Thursday. 4.  Cirrhosis of the liver with ascites.  Overall prognosis is poor once this occurs.  Since it is a holiday weekend I am not sure if we will be able to get a paracentesis while he is here. 5.  Anemia which is chronic.  Patient has chronic GI bleed. 6.  History of drug abuse.  Send urine toxicology. 7.  History of diastolic congestive heart failure and fluid overload.  Give 1 dose of Lasix.  Dialysis to help manage fluid.  All the records are reviewed and case discussed with ED provider. Management plans discussed with the patient, family and they are in agreement.  CODE STATUS: Full code  TOTAL TIME TAKING CARE OF THIS PATIENT: 50 minutes including ACP time.    Loletha Grayer M.D on 11/10/2017 at 9:04 PM  Between 7am to 6pm - Pager - [phone]  After 6pm call admission pager [phone]  [facility] Office  [phone]  CC: Primary care physician; [name], MD

## 2017-11-10 NOTE — ED Provider Notes (Signed)
[facility] Emergency Department Provider Note ____________________________________________   First MD Initiated Contact with Patient 11/10/17 1750     (approximate)  I have reviewed the triage vital signs and the nursing notes.   HISTORY  Chief Complaint Respiratory Distress  HPI Stanley [name] is a 57 y.o. male with a history of CHF, end-stage renal disease on dialysis, alcohol abuse and cirrhosis with recurrent ascites who was presented to the emergency department with worsening difficulty breathing.  He says that he was tapped last week and had 6-1/2 L taken off.  He says that his belly has refilled and as of this afternoon he is having difficulty breathing and this is why called the ambulance.  He says that he was last dialyzed yesterday through his right sided chest permacath.  Not reporting any pain.  Past Medical History:  Diagnosis Date  . Alcohol abuse   . CHF (congestive heart failure) ([place])   . Cirrhosis ([place])   . Coronary artery disease 2009  . Drug abuse ([place])   . End stage renal disease on dialysis [facility]) NEPHROLOGIST-   DR [facility]  IN [place]   HEMODIALYSIS --   TUES/  THURS/  SAT  . Gastrointestinal bleed 06/13/2017   From chart...hx of multiple GI bleeds  . GERD (gastroesophageal reflux disease)   . Hyperlipidemia   . Hypertension   . PAD (peripheral artery disease) ([place])   . Renal insufficiency    Per pt, 32 oz fluid restriction per day  . S/P triple vessel bypass 06/09/2016   2009ish  . Suicidal ideation    & HOMICIDAL IDEATION --  06-16-2013   ADMITTED TO BEHAVIOR HEALTH    Patient Active Problem List   Diagnosis Date Noted  . Acute respiratory failure ([place]) 10/29/2017  . ESRD (end stage renal disease) on dialysis ([place]) 07/28/2017  . Protein-calorie malnutrition, severe 06/14/2017  . Encounter for dialysis [facility])   . Palliative care by specialist   . Goals of care, counseling/discussion   . Malnutrition of moderate  degree 06/05/2017  . Secondary esophageal varices without bleeding ([place])   . Stomach irritation   . Idiopathic esophageal varices without bleeding ([place])   . Alcoholic hepatitis with ascites 05/24/2017  . ESRD (end stage renal disease) ([place]) 04/28/2017  . Uremia 03/08/2017  . ESRD on hemodialysis ([place]) 03/03/2017  . Weakness 02/28/2017  . Hypocalcemia 02/22/2017  . Shortness of breath 11/26/2016  . COPD (chronic obstructive pulmonary disease) ([place]) 10/30/2016  . COPD exacerbation ([place]) 10/29/2016  . Anemia   . Heme positive stool   . Ulceration of intestine   . Benign neoplasm of transverse colon   . Acute gastrointestinal hemorrhage   . Esophageal candidiasis ([place])   . Angiodysplasia of intestinal tract   . Acute respiratory failure with hypoxia ([place]) 07/03/2016  . GI bleeding 06/24/2016  . Rectal bleeding 06/14/2016  . Anemia of chronic disease 06/01/2016  . MRSA carrier 06/01/2016  . Chronic renal failure 05/23/2016  . Ischemic heart disease 05/23/2016  . Angiodysplasia of small intestine   . Melena   . Small bowel bleed not requiring more than 4 units of blood in 24 hours, ICU, or surgery   . Anemia due to chronic blood loss   . Abdominal pain 05/05/2016  . Acute posthemorrhagic anemia 04/17/2016  . Gastrointestinal bleed 04/17/2016  . History of esophagogastroduodenoscopy (EGD) 04/17/2016  . Elevated troponin 04/17/2016  . Alcohol abuse 04/17/2016  . Upper GI bleed 01/19/2016  . Blood  in stool   . Angiodysplasia of stomach and duodenum with hemorrhage   . Gastritis   . Reflux esophagitis   . GI bleed 05/16/2015  . Acute GI bleeding   . Symptomatic anemia 04/30/2015  . HTN (hypertension) 04/06/2015  . GERD (gastroesophageal reflux disease) 04/06/2015  . HLD (hyperlipidemia) 04/06/2015  . Dyspnea 04/06/2015  . Cirrhosis of liver with ascites ([place]) 04/06/2015  . Ascites 04/06/2015  . GIB (gastrointestinal bleeding) 03/23/2015  . Homicidal ideation 06/19/2013  .  Suicidal intent 06/19/2013  . Homicidal ideations 06/19/2013  . Hyperkalemia 06/16/2013  . Mandible fracture ([place]) 06/05/2013  . Fracture, mandible ([place]) 06/02/2013  . Coronary atherosclerosis of native coronary artery 06/02/2013  . ESRD on dialysis ([place]) 06/02/2013  . Mandible open fracture ([place]) 06/02/2013    Past Surgical History:  Procedure Laterality Date  . A/V FISTULAGRAM Right 06/06/2017   Procedure: A/V FISTULAGRAM;  Surgeon: Katha Cabal, MD;  Location: [place] CV LAB;  Service: Cardiovascular;  Laterality: Right;  . A/V SHUNT INTERVENTION N/A 06/06/2017   Procedure: A/V SHUNT INTERVENTION;  Surgeon: Katha Cabal, MD;  Location: [place] [facility];  Service: Cardiovascular;  Laterality: N/A;  . AGILE CAPSULE N/A 06/19/2016   Procedure: AGILE CAPSULE;  Surgeon: [name], MD;  Location: [facility] ENDOSCOPY;  Service: Endoscopy;  Laterality: N/A;  . COLONOSCOPY WITH PROPOFOL N/A 06/18/2016   Procedure: COLONOSCOPY WITH PROPOFOL;  Surgeon: [name], MD;  Location: [facility] ENDOSCOPY;  Service: Endoscopy;  Laterality: N/A;  . COLONOSCOPY WITH PROPOFOL N/A 08/12/2016   Procedure: COLONOSCOPY WITH PROPOFOL;  Surgeon: [name], MD;  Location: [facility] ENDOSCOPY;  Service: Endoscopy;  Laterality: N/A;  . COLONOSCOPY WITH PROPOFOL N/A 05/05/2017   Procedure: COLONOSCOPY WITH PROPOFOL;  Surgeon: Manya Silvas, MD;  Location: [facility] ENDOSCOPY;  Service: Endoscopy;  Laterality: N/A;  . CORONARY ANGIOPLASTY  ?   PT UNABLE TO TELL IF  BEFORE OR AFTER  CABG  . CORONARY ARTERY BYPASS GRAFT  2008  ([place] , [place])   3 VESSEL  . DIALYSIS FISTULA CREATION  LAST SURGERY  APPOX  2008  . ENTEROSCOPY N/A 05/10/2016   Procedure: ENTEROSCOPY;  Surgeon: Jerene Bears, MD;  Location: [place];  Service: Gastroenterology;  Laterality: N/A;  . ENTEROSCOPY N/A 08/12/2016   Procedure: ENTEROSCOPY;  Surgeon: [name], MD;  Location: [facility] ENDOSCOPY;  Service: Endoscopy;  Laterality: N/A;  .  ENTEROSCOPY Left 06/03/2017   Procedure: ENTEROSCOPY;  Surgeon: Virgel Manifold, MD;  Location: [facility] ENDOSCOPY;  Service: Endoscopy;  Laterality: Left;  Procedure date will ultimately depend on when patient is medically optimized before the procedure, pending hemodialysis and blood transfusions etc. Will place on schedule and change depending on clinical status.   . ENTEROSCOPY N/A 06/05/2017   Procedure: ENTEROSCOPY;  Surgeon: Virgel Manifold, MD;  Location: [facility] ENDOSCOPY;  Service: Endoscopy;  Laterality: N/A;  . ENTEROSCOPY N/A 06/15/2017   Procedure: Push ENTEROSCOPY;  Surgeon: [name], MD;  Location: [facility] ENDOSCOPY;  Service: Endoscopy;  Laterality: N/A;  . ESOPHAGOGASTRODUODENOSCOPY N/A 05/07/2015   Procedure: ESOPHAGOGASTRODUODENOSCOPY (EGD);  Surgeon: Hulen Luster, MD;  Location: [facility] ENDOSCOPY;  Service: Endoscopy;  Laterality: N/A;  . ESOPHAGOGASTRODUODENOSCOPY (EGD) WITH PROPOFOL N/A 05/17/2015   Procedure: ESOPHAGOGASTRODUODENOSCOPY (EGD) WITH PROPOFOL;  Surgeon: [name], MD;  Location: [facility] ENDOSCOPY;  Service: Endoscopy;  Laterality: N/A;  . ESOPHAGOGASTRODUODENOSCOPY (EGD) WITH PROPOFOL N/A 01/20/2016   Procedure: ESOPHAGOGASTRODUODENOSCOPY (EGD) WITH PROPOFOL;  Surgeon: [name], MD;  Location: [facility] ENDOSCOPY;  Service:  Endoscopy;  Laterality: N/A;  . ESOPHAGOGASTRODUODENOSCOPY (EGD) WITH PROPOFOL N/A 04/17/2016   Procedure: ESOPHAGOGASTRODUODENOSCOPY (EGD) WITH PROPOFOL;  Surgeon: Lin Landsman, MD;  Location: [facility] ENDOSCOPY;  Service: Gastroenterology;  Laterality: N/A;  . ESOPHAGOGASTRODUODENOSCOPY (EGD) WITH PROPOFOL  05/09/2016   Procedure: ESOPHAGOGASTRODUODENOSCOPY (EGD) WITH PROPOFOL;  Surgeon: Jerene Bears, MD;  Location: [place];  Service: Endoscopy;;  . ESOPHAGOGASTRODUODENOSCOPY (EGD) WITH PROPOFOL N/A 06/16/2016   Procedure: ESOPHAGOGASTRODUODENOSCOPY (EGD) WITH PROPOFOL;  Surgeon: [name], MD;  Location: [facility] ENDOSCOPY;  Service: Endoscopy;   Laterality: N/A;  . ESOPHAGOGASTRODUODENOSCOPY (EGD) WITH PROPOFOL N/A 05/05/2017   Procedure: ESOPHAGOGASTRODUODENOSCOPY (EGD) WITH PROPOFOL;  Surgeon: Manya Silvas, MD;  Location: [facility] ENDOSCOPY;  Service: Endoscopy;  Laterality: N/A;  . ESOPHAGOGASTRODUODENOSCOPY (EGD) WITH PROPOFOL N/A 06/15/2017   Procedure: ESOPHAGOGASTRODUODENOSCOPY (EGD) WITH PROPOFOL;  Surgeon: [name], MD;  Location: [facility] ENDOSCOPY;  Service: Endoscopy;  Laterality: N/A;  . GIVENS CAPSULE STUDY N/A 05/07/2016   Procedure: GIVENS CAPSULE STUDY;  Surgeon: Doran Stabler, MD;  Location: [place];  Service: Endoscopy;  Laterality: N/A;  . MANDIBULAR HARDWARE REMOVAL N/A 07/29/2013   Procedure: REMOVAL OF ARCH BARS;  Surgeon: Theodoro Kos, DO;  Location: [place];  Service: Plastics;  Laterality: N/A;  . ORIF MANDIBULAR FRACTURE N/A 06/05/2013   Procedure: REPAIR OF MANDIBULAR FRACTURE x 2 with maxillo-mandibular fixation ;  Surgeon: Theodoro Kos, DO;  Location: [place];  Service: Plastics;  Laterality: N/A;  . PARACENTESIS    . PERIPHERAL ARTERIAL STENT GRAFT Left     Prior to Admission medications   Medication Sig Start Date End Date Taking? Authorizing Provider  albuterol (PROVENTIL HFA;VENTOLIN HFA) 108 (90 Base) MCG/ACT inhaler Inhale 2 puffs into the lungs every 6 (six) hours as needed for wheezing or shortness of breath. 04/21/17  Yes Lisa Roca, MD  B Complex-C-Zn-Folic Acid (DIALYVITE/ZINC) TABS Take 1 tablet by mouth daily. 09/08/17  Yes [provider]  budesonide-formoterol (SYMBICORT) 160-4.5 MCG/ACT inhaler Inhale 2 puffs into the lungs daily. 11/16/16  Yes Vaughan Basta, MD  calcium acetate (PHOSLO) 667 MG capsule Take 2,001 mg by mouth 3 (three) times daily.   Yes [provider]  ferrous sulfate 325 (65 FE) MG tablet Take 1 tablet by mouth 3 (three) times daily. 01/21/17  Yes [provider]  folic acid (FOLVITE) 1 MG tablet Take 1 tablet (1 mg  total) by mouth daily. 10/31/17  Yes Fritzi Mandes, MD  gabapentin (NEURONTIN) 300 MG capsule Take 300 mg by mouth 2 (two) times daily.  08/16/16  Yes [provider]  ipratropium-albuterol (DUONEB) 0.5-2.5 (3) MG/3ML SOLN Take 3 mLs by nebulization every 6 (six) hours as needed. 11/28/16  Yes Wieting, Richard, MD  labetalol (NORMODYNE) 100 MG tablet Take 100 mg by mouth daily. 03/24/17  Yes [provider]  multivitamin (RENA-VIT) TABS tablet Take 1 tablet by mouth at bedtime. 10/31/17  Yes Fritzi Mandes, MD  nitroGLYCERIN (NITROSTAT) 0.4 MG SL tablet Place 1 tablet (0.4 mg total) under the tongue every 5 (five) minutes as needed. 04/12/16  Yes Wende Bushy, MD  omeprazole (PRILOSEC) 40 MG capsule Take 40 mg by mouth daily. 09/08/17  Yes [provider]  Spacer/Aero Chamber Mouthpiece MISC 1 Units by Does not apply route every 4 (four) hours as needed (wheezing). 02/19/17  Yes Darel Hong, MD  tiotropium (SPIRIVA HANDIHALER) 18 MCG inhalation capsule Place 1 capsule (18 mcg total) into inhaler and inhale daily. 11/16/16 11/16/17 Yes Vaughan Basta, MD  Allergies Patient has no known allergies.  Family History  Problem Relation Age of Onset  . Colon cancer Mother   . Cancer Father   . Cancer Sister   . Kidney disease Brother     Social History Social History   Tobacco Use  . Smoking status: Current Every Day Smoker    Packs/day: 0.15    Years: 40.00    Pack years: 6.00    Types: Cigarettes  . Smokeless tobacco: Never Used  Substance Use Topics  . Alcohol use: No    Comment: pt reports quitting after learning about cirrhosis  . Drug use: No    Frequency: 7.0 times per week    Types: Marijuana, Cocaine    Review of Systems  Constitutional: No fever/chills Eyes: No visual changes. ENT: No sore throat. Cardiovascular: Denies chest pain. Respiratory: As above Gastrointestinal: No abdominal pain.  No nausea, no vomiting.  No diarrhea.  No  constipation. Genitourinary: Negative for dysuria. Musculoskeletal: Negative for back pain. Skin: Negative for rash. Neurological: Negative for headaches, focal weakness or numbness.   ____________________________________________   PHYSICAL EXAM:  VITAL SIGNS: ED Triage Vitals  Enc Vitals Group     BP 11/10/17 1805 117/65     Pulse Rate 11/10/17 1805 89     Resp 11/10/17 1805 20     Temp 11/10/17 1805 98.4 F (36.9 C)     Temp Source 11/10/17 1805 Oral     SpO2 11/10/17 1805 98 %     Weight 11/10/17 1807 180 lb 12.4 oz (82 kg)     Height 11/10/17 1807 6\' 3"  (1.905 m)     Head Circumference --      Peak Flow --      Pain Score 11/10/17 1807 8     Pain Loc --      Pain Edu? --      Excl. in [place]? --     Constitutional: Alert and oriented. Well appearing and in no acute distress. Eyes: Conjunctivae are normal.  Head: Atraumatic. Nose: No congestion/rhinnorhea. Mouth/Throat: Mucous membranes are moist.  Neck: No stridor.   Cardiovascular: Normal rate, regular rhythm. Grossly normal heart sounds.  Right-sided chest permacath CDI. Respiratory: Labored respirations with use of accessory muscles.  Wheezing throughout all fields with decreased lung sounds. Gastrointestinal: Soft and nontender.  Distended abdomen but not tense. Musculoskeletal: Mild bilateral lower extremity edema. Neurologic:  Normal speech and language. No gross focal neurologic deficits are appreciated. Skin:  Skin is warm, dry and intact. No rash noted. Psychiatric: Mood and affect are normal. Speech and behavior are normal.  ____________________________________________   LABS (all labs ordered are listed, but only abnormal results are displayed)  Labs Reviewed  CBC WITH DIFFERENTIAL/PLATELET - Abnormal; Notable for the following components:      Result Value   RBC 3.70 (*)    Hemoglobin 8.4 (*)    HCT 27.2 (*)    MCV 73.6 (*)    MCH 22.8 (*)    MCHC 30.9 (*)    RDW 21.0 (*)    Lymphs Abs 0.8 (*)     All other components within normal limits  PROTIME-INR  COMPREHENSIVE METABOLIC PANEL  TROPONIN I   ____________________________________________  EKG  ED ECG REPORT I, Doran Stabler, the attending physician, personally viewed and interpreted this ECG.   Date: 11/10/2017  EKG Time: 1913  Rate: 79  Rhythm: normal sinus rhythm  Axis: normal  Intervals:none  ST&T Change: No ST segment elevation  or depression.  No abnormal T wave inversion.  ____________________________________________  RADIOLOGY  Bibasilar atelectasis without focal consolidation. ____________________________________________   PROCEDURES  Procedure(s) performed:   Procedures  Critical Care performed:   ____________________________________________   INITIAL IMPRESSION / ASSESSMENT AND PLAN / ED COURSE  Pertinent labs & imaging results that were available during my care of the patient were reviewed by me and considered in my medical decision making (see chart for details).  Differential includes, but is not limited to, viral syndrome, bronchitis including COPD exacerbation, pneumonia, reactive airway disease including asthma, CHF including exacerbation with or without pulmonary/interstitial edema, pneumothorax, ACS, thoracic trauma, and pulmonary embolism. As part of my medical decision making, I reviewed the following data within the electronic MEDICAL RECORD NUMBER Notes from prior ED visits  ----------------------------------------- 7:36 PM on 11/10/2017 -----------------------------------------  Patient at this time still with diffuse wheezing and prolonged expiratory phase.  Will be admitted to the hospital.  Signed out to Dr. Bobbye Charleston. ____________________________________________   FINAL CLINICAL IMPRESSION(S) / ED DIAGNOSES  Ascites.  COPD exacerbation.  NEW MEDICATIONS STARTED DURING THIS VISIT:  New Prescriptions   No medications on file     Note:  This document was prepared using  Dragon voice recognition software and may include unintentional dictation errors.     Orbie Pyo, MD 11/10/17 [phone]

## 2017-11-10 NOTE — ED Notes (Signed)
Pt complaining "I can't breath." Pt O2 sat 98% on room air. Requested oxygen via [place] because "that's the only way I will be able to breathe." Pt placed on 2L [place] for comfort.

## 2017-11-10 NOTE — ED Notes (Signed)
Attempted to call report x1. RN will call back in approx 3 mins.

## 2017-11-11 ENCOUNTER — Inpatient Hospital Stay: Payer: Medicare Other

## 2017-11-11 LAB — RENAL FUNCTION PANEL
Albumin: 2.9 g/dL — ABNORMAL LOW (ref 3.5–5.0)
Anion gap: 12 (ref 5–15)
BUN: 59 mg/dL — AB (ref 6–20)
CO2: 25 mmol/L (ref 22–32)
Calcium: 9.2 mg/dL (ref 8.9–10.3)
Chloride: 101 mmol/L (ref 98–111)
Creatinine, Ser: 10.59 mg/dL — ABNORMAL HIGH (ref 0.61–1.24)
GFR, EST AFRICAN AMERICAN: 5 mL/min — AB (ref >60)
GFR, EST NON AFRICAN AMERICAN: 5 mL/min — AB (ref >60)
Glucose, Bld: 155 mg/dL — ABNORMAL HIGH (ref 70–99)
POTASSIUM: 6.3 mmol/L — AB (ref 3.5–5.1)
Phosphorus: 4.3 mg/dL (ref 2.5–4.6)
Sodium: 138 mmol/L (ref 135–145)

## 2017-11-11 LAB — URINE DRUG SCREEN, QUALITATIVE (ARMC ONLY)
AMPHETAMINES, UR SCREEN: NOT DETECTED
Barbiturates, Ur Screen: NOT DETECTED
COCAINE METABOLITE, UR ~~LOC~~: POSITIVE — AB
Cannabinoid 50 Ng, Ur ~~LOC~~: NOT DETECTED
MDMA (ECSTASY) UR SCREEN: NOT DETECTED
Methadone Scn, Ur: NOT DETECTED
Opiate, Ur Screen: NOT DETECTED
Phencyclidine (PCP) Ur S: NOT DETECTED
TRICYCLIC, UR SCREEN: NOT DETECTED

## 2017-11-11 LAB — CBC
HEMATOCRIT: 28.3 % — AB (ref 40.0–52.0)
Hemoglobin: 8.6 g/dL — ABNORMAL LOW (ref 13.0–18.0)
MCH: 22.6 pg — ABNORMAL LOW (ref 26.0–34.0)
MCHC: 30.3 g/dL — ABNORMAL LOW (ref 32.0–36.0)
MCV: 74.4 fL — ABNORMAL LOW (ref 80.0–100.0)
PLATELETS: 285 10*3/uL (ref 150–440)
RBC: 3.8 MIL/uL — ABNORMAL LOW (ref 4.40–5.90)
RDW: 21 % — AB (ref 11.5–14.5)
WBC: 6.1 10*3/uL (ref 3.8–10.6)

## 2017-11-11 LAB — GLUCOSE, CAPILLARY: Glucose-Capillary: 155 mg/dL — ABNORMAL HIGH (ref 70–99)

## 2017-11-11 MED ORDER — GI COCKTAIL ~~LOC~~
30.0000 mL | Freq: Once | ORAL | Status: DC
  Filled 2017-11-11: qty 30

## 2017-11-11 MED ORDER — NICOTINE 21 MG/24HR TD PT24
21.0000 mg | MEDICATED_PATCH | Freq: Every day | TRANSDERMAL | Status: DC
  Filled 2017-11-11 (×2): qty 1

## 2017-11-11 MED ORDER — OXYCODONE HCL 5 MG PO TABS
10.0000 mg | ORAL_TABLET | Freq: Three times a day (TID) | ORAL | Status: DC | PRN
  Administered 2017-11-11 – 2017-11-12 (×2): 10 mg via ORAL
  Filled 2017-11-11 (×2): qty 2

## 2017-11-11 MED ORDER — ZOLPIDEM TARTRATE 5 MG PO TABS
5.0000 mg | ORAL_TABLET | Freq: Once | ORAL | Status: AC
  Administered 2017-11-11: 5 mg via ORAL
  Filled 2017-11-11: qty 1

## 2017-11-11 MED ORDER — CHLORHEXIDINE GLUCONATE CLOTH 2 % EX PADS
6.0000 | MEDICATED_PAD | Freq: Every day | CUTANEOUS | Status: DC
  Administered 2017-11-12: 6 via TOPICAL

## 2017-11-11 MED ORDER — DIPHENHYDRAMINE HCL 12.5 MG/5ML PO ELIX
12.5000 mg | ORAL_SOLUTION | Freq: Four times a day (QID) | ORAL | Status: DC | PRN
  Administered 2017-11-11: 12.5 mg via ORAL
  Filled 2017-11-11 (×2): qty 5

## 2017-11-11 MED ORDER — EPOETIN ALFA 10000 UNIT/ML IJ SOLN
10000.0000 [IU] | INTRAMUSCULAR | Status: DC
  Administered 2017-11-11: 10000 [IU] via INTRAVENOUS

## 2017-11-11 MED ORDER — KETOROLAC TROMETHAMINE 30 MG/ML IJ SOLN
30.0000 mg | Freq: Once | INTRAMUSCULAR | Status: AC
  Administered 2017-11-11: 30 mg via INTRAVENOUS
  Filled 2017-11-11 (×2): qty 1

## 2017-11-11 MED ORDER — DIPHENHYDRAMINE HCL 50 MG/ML IJ SOLN
12.5000 mg | Freq: Once | INTRAMUSCULAR | Status: AC
  Administered 2017-11-11: 12.5 mg via INTRAVENOUS

## 2017-11-11 MED ORDER — HEPARIN SODIUM (PORCINE) 5000 UNIT/ML IJ SOLN
5000.0000 [IU] | Freq: Three times a day (TID) | INTRAMUSCULAR | Status: DC
  Administered 2017-11-11 – 2017-11-12 (×3): 5000 [IU] via SUBCUTANEOUS
  Filled 2017-11-11 (×3): qty 1

## 2017-11-11 NOTE — Progress Notes (Signed)
Patient has old unusable fistulae on both arms. He says they are going to try to place another in his right forearm.  Restricted band placed on this arm.

## 2017-11-11 NOTE — Progress Notes (Signed)
This note also relates to the following rows which could not be included: Pulse Rate - Cannot attach notes to unvalidated device data Resp - Cannot attach notes to unvalidated device data BP - Cannot attach notes to unvalidated device data  Hd started  

## 2017-11-11 NOTE — Plan of Care (Signed)
  Problem: Education: Goal: Knowledge of General Education information will improve Description: Including pain rating scale, medication(s)/side effects and non-pharmacologic comfort measures Outcome: Progressing   Problem: Clinical Measurements: Goal: Will remain free from infection Outcome: Progressing   Problem: Safety: Goal: Ability to remain free from injury will improve Outcome: Progressing   

## 2017-11-11 NOTE — Progress Notes (Addendum)
[place] [facility] [place] NAME: Stanley [name]    MR#:  [id number]  DATE OF BIRTH:  [birth year]  SUBJECTIVE:  CHIEF COMPLAINT:   Chief Complaint  Patient presents with  . Respiratory Distress  Patient seen and evaluated today Decreased shortness of breath No chest pain No fever  REVIEW OF SYSTEMS:    ROS  CONSTITUTIONAL: No documented fever. No fatigue, weakness. No weight gain, no weight loss.  EYES: No blurry or double vision.  ENT: No tinnitus. No postnasal drip. No redness of the oropharynx.  RESPIRATORY: No cough, decreased wheeze, no hemoptysis. No dyspnea.  CARDIOVASCULAR: No chest pain. No orthopnea. No palpitations. No syncope.  GASTROINTESTINAL: No nausea, no vomiting or diarrhea. No abdominal pain. No melena or hematochezia.  GENITOURINARY: No dysuria or hematuria.  ENDOCRINE: No polyuria or nocturia. No heat or cold intolerance.  HEMATOLOGY: No anemia. No bruising. No bleeding.  INTEGUMENTARY: No rashes. No lesions.  MUSCULOSKELETAL: No arthritis. No swelling. No gout.  NEUROLOGIC: No numbness, tingling, or ataxia. No seizure-type activity.  PSYCHIATRIC: No anxiety. No insomnia. No ADD.   DRUG ALLERGIES:  No Known Allergies  VITALS:  Blood pressure 124/70, pulse 77, temperature 97.9 F (36.6 C), temperature source Oral, resp. rate 16, height _0  (1.905 m), weight 82.3 kg, SpO2 91 %.  PHYSICAL EXAMINATION:   Physical Exam  GENERAL:  57 y.o.-year-old patient lying in the bed with no acute distress.  EYES: Pupils equal, round, reactive to light and accommodation. No scleral icterus. Extraocular muscles intact.  HEENT: Head atraumatic, normocephalic. Oropharynx and nasopharynx clear.  NECK:  Supple, no jugular venous distention. No thyroid enlargement, no tenderness.  LUNGS: Improved breath sounds bilaterally, no wheezing, rales, rhonchi. No use of accessory muscles of respiration.  CARDIOVASCULAR: S1, S2 normal. No  murmurs, rubs, or gallops.  ABDOMEN: Soft, nontender, distended. Bowel sounds present. No organomegaly or mass.  EXTREMITIES: No cyanosis, clubbing or edema b/l.    NEUROLOGIC: Cranial nerves II through XII are intact. No focal Motor or sensory deficits b/l.   PSYCHIATRIC: The patient is alert and oriented x 3.  SKIN: No obvious rash, lesion, or ulcer.   LABORATORY PANEL:   CBC Recent Labs  Lab 11/11/17 0847  WBC 6.1  HGB 8.6*  HCT 28.3*  PLT 285   ------------------------------------------------------------------------------------------------------------------ Chemistries  Recent Labs  Lab 11/10/17 1855 11/11/17 0847  NA 139 138  K 5.8* 6.3*  CL 103 101  CO2 28 25  GLUCOSE 119* 155*  BUN 51* 59*  CREATININE 9.78* 10.59*  CALCIUM 8.5* 9.2  AST 20  --   ALT 17  --   ALKPHOS 114  --   BILITOT 0.5  --    ------------------------------------------------------------------------------------------------------------------  Cardiac Enzymes Recent Labs  Lab 11/10/17 1855  TROPONINI 0.05*   ------------------------------------------------------------------------------------------------------------------  RADIOLOGY:  Dg Chest 1 View  Result Date: 11/10/2017 CLINICAL DATA:  Shortness of breath EXAM: CHEST  1 VIEW COMPARISON:  Eighteen 19 FINDINGS: Unchanged position of right chest wall hemodialysis catheter. Remote CABG. Moderate cardiomegaly is unchanged. There is bibasilar atelectasis. No focal airspace consolidation or pulmonary edema. IMPRESSION: 1. Bibasilar atelectasis without focal consolidation. 2. Unchanged cardiomegaly with aortic atherosclerosis (ICD10-I70.0). Electronically Signed   By: Ulyses Jarred M.D.   On: 11/10/2017 18:50     ASSESSMENT AND PLAN:  57 year old male patient with history of cirrhosis of liver, end-stage renal disease on dialysis, COPD, diastolic heart failure currently under hospitalist service for shortness of breath  -  Hyperkalemia Patient  was given local MAC, insulin, D50 and bicarb and calcium in the emergency room Status post nephrology evaluation Dialysis this morning  -Acute COPD exacerbation Improving slowly Nebulization treatments and steroids  -End-stage renal disease Continue dialysis  -Cirrhosis of liver with ascites Long-term prognosis poor Ultrasound-guided paracentesis  -Chronic anemia Monitor hemoglobin hematocrit  -Chronic diastolic heart failure Dialysis to manage fluid  -Tobacco abuse Tobacco cessation counseled to the patient for 6 minutes Nicotine patch offered   All the records are reviewed and case discussed with Care Management/Social Worker. Management plans discussed with the patient, family and they are in agreement.  CODE STATUS: Full code  DVT Prophylaxis: SCDs  TOTAL TIME TAKING CARE OF THIS PATIENT: 35 minutes.   POSSIBLE D/C IN 2 to 3 DAYS, DEPENDING ON CLINICAL CONDITION.  Saundra Shelling M.D on 11/11/2017 at 1:13 PM  Between 7am to 6pm - Pager - [phone]  After 6pm go to [email or web address] - password [email or web address] [facility]  Office  [phone]  CC: Primary care physician; [name], MD  Note: This dictation was prepared with Dragon dictation along with smaller phrase technology. Any transcriptional errors that result from this process are unintentional.

## 2017-11-11 NOTE — Progress Notes (Signed)
This note also relates to the following rows which could not be included: Pulse Rate - Cannot attach notes to unvalidated device data Resp - Cannot attach notes to unvalidated device data BP - Cannot attach notes to unvalidated device data  Hd completed  

## 2017-11-11 NOTE — Progress Notes (Addendum)
Pt is requesting medicine for sleep and was not pleasant. Notify prime. Will continue to monitor.  Update 2240: Doctor Duane Boston ordered a 50 mg tablet of Ambien once. Will continue to monitor.

## 2017-11-11 NOTE — Progress Notes (Signed)
[facility]  ROUNDING NOTE   Subjective:   [name] admitted to [facility] on 11/10/2017 for Other ascites [R18.8] COPD exacerbation ([place]) [J44.1]   Patient complains of abdominal ascites.  Breathing much better.   Seen and examined on hemodialysis treatment. Tolerated treatment well.     HEMODIALYSIS FLOWSHEET:  Blood Flow Rate (mL/min): 400 mL/min Arterial Pressure (mmHg): -150 mmHg Venous Pressure (mmHg): 150 mmHg Transmembrane Pressure (mmHg): 70 mmHg Ultrafiltration Rate (mL/min): 1000 mL/min Dialysate Flow Rate (mL/min): 600 ml/min Conductivity: Machine : 13.8 Conductivity: Machine : 13.8 Dialysis Fluid Bolus: Normal Saline Bolus Amount (mL): 250 mL     Objective:  Vital signs in last 24 hours:  Temp:  [97.6 F (36.4 C)-98.5 F (36.9 C)] 97.9 F (36.6 C) (08/31 1100) Pulse Rate:  [63-89] 77 (08/31 1230) Resp:  [15-20] 16 (08/31 1230) BP: (117-154)/(65-92) 124/70 (08/31 1230) SpO2:  [91 %-100 %] 91 % (08/31 0828) Weight:  [82 kg-82.3 kg] 82.3 kg (08/30 2205)  Weight change:  Filed Weights   11/10/17 1807 11/10/17 2205  Weight: 82 kg 82.3 kg    Intake/Output: I/O last 3 completed shifts: In: 790 [P.O.:390; IV Piggyback:400] Out: 150 [Urine:150]   Intake/Output this shift:  Total I/O In: 240 [P.O.:240] Out: -   Physical Exam: General: No acute distress  Head: Normocephalic, atraumatic. Moist oral mucosal membranes  Neck: Supple  Lungs:  Clear to auscultation, normal effort  Heart: N8G9 no rubs, +systolic murmur  Abdomen:  + distended, ascites  Extremities: + peripheral edema.  Neurologic: Awake, alert  Skin: No lesions  Access: R IJ permcath    Basic Metabolic Panel: Recent Labs  Lab 11/10/17 1855 11/11/17 0847  NA 139 138  K 5.8* 6.3*  CL 103 101  CO2 28 25  GLUCOSE 119* 155*  BUN 51* 59*  CREATININE 9.78* 10.59*  CALCIUM 8.5* 9.2  PHOS  --  4.3    Liver Function Tests: Recent Labs  Lab 11/10/17 1855  11/11/17 0847  AST 20  --   ALT 17  --   ALKPHOS 114  --   BILITOT 0.5  --   PROT 6.3*  --   ALBUMIN 2.6* 2.9*   No results for input(s): LIPASE, AMYLASE in the last 168 hours. No results for input(s): AMMONIA in the last 168 hours.  CBC: Recent Labs  Lab 11/10/17 1855 11/11/17 0847  WBC 6.8 6.1  NEUTROABS 4.9  --   HGB 8.4* 8.6*  HCT 27.2* 28.3*  MCV 73.6* 74.4*  PLT 290 285    Cardiac Enzymes: Recent Labs  Lab 11/10/17 1855  TROPONINI 0.05*    BNP: Invalid input(s): POCBNP  CBG: Recent Labs  Lab 11/10/17 2217 11/11/17 0104  GLUCAP 114* 155*    Microbiology: Results for orders placed or performed during the hospital encounter of 10/29/17  MRSA PCR Screening     Status: None   Collection Time: 10/30/17 12:28 AM  Result Value Ref Range Status   MRSA by PCR NEGATIVE NEGATIVE Final    Comment:        The GeneXpert MRSA Assay (FDA approved for NASAL specimens only), is one component of a comprehensive MRSA colonization surveillance program. It is not intended to diagnose MRSA infection nor to guide or monitor treatment for MRSA infections. Performed at [facility], [address] [place], [place] [address]     Coagulation Studies: Recent Labs    11/10/17 1855  LABPROT 14.1  INR 1.10  Urinalysis: No results for input(s): COLORURINE, LABSPEC, PHURINE, GLUCOSEU, HGBUR, BILIRUBINUR, KETONESUR, PROTEINUR, UROBILINOGEN, NITRITE, LEUKOCYTESUR in the last 72 hours.  Invalid input(s): APPERANCEUR    Imaging: Dg Chest 1 View  Result Date: 11/10/2017 CLINICAL DATA:  Shortness of breath EXAM: CHEST  1 VIEW COMPARISON:  Eighteen 19 FINDINGS: Unchanged position of right chest wall hemodialysis catheter. Remote CABG. Moderate cardiomegaly is unchanged. There is bibasilar atelectasis. No focal airspace consolidation or pulmonary edema. IMPRESSION: 1. Bibasilar atelectasis without focal consolidation. 2. Unchanged cardiomegaly with aortic  atherosclerosis (ICD10-I70.0). Electronically Signed   By: Ulyses Jarred M.D.   On: 11/10/2017 18:50     Medications:       Assessment/ Plan:  57 y.o. black male withend stage renal disease on hemodialysis secondary to Alport's syndrome, ascites, hypertension, anemia of chronic kidney disease, coronary artery disease, peripheral vascular disease, hyperlipidemia, gastrointestinal AVMs, pulmonary hypertension  [facility] TTS   1. End stage renal disease with hyperkalemia: Patient seen and evaluated during hemodialysis today.  Tolerating well.  2K bath Continue TTS schedule.   2.  Hypertension: 124/70 - at goal.  - labetalol - will discontinue due to positive cocaine on urine tox screen.     3. Ascites: Ultrasound guided paracentesis for later today or tomorrow. Discussed plan on care with Dr. Annamaria Boots, Radiology.  Orders prepared.   4. Anemia of chronic kidney disease:  - EPO with HD treatment - Patient gets IV iron as outpatient. No indication for oral iron supplements, will discontinue.   5. Secondary Hyperparathyroidism with hyperphosphatemia: PTH 1927, phosphorus 6.5 on 8/6 - Continue calcium acetate with meals.   6. Acute COPD exacerbation:  - methylprednisone and azithromycin.    LOS: 1 Ahava Kissoon 8/31/20191:02 PM

## 2017-11-11 NOTE — Progress Notes (Signed)
Pt complaining of pain in foot. Pt is very unhappy and lots of negative vocalization. MD orders oxycodone. I will continue to assist RN.

## 2017-11-12 LAB — BASIC METABOLIC PANEL
Anion gap: 14 (ref 5–15)
BUN: 41 mg/dL — AB (ref 6–20)
CO2: 25 mmol/L (ref 22–32)
CREATININE: 7.48 mg/dL — AB (ref 0.61–1.24)
Calcium: 8.7 mg/dL — ABNORMAL LOW (ref 8.9–10.3)
Chloride: 97 mmol/L — ABNORMAL LOW (ref 98–111)
GFR calc non Af Amer: 7 mL/min — ABNORMAL LOW (ref >60)
GFR, EST AFRICAN AMERICAN: 8 mL/min — AB (ref >60)
Glucose, Bld: 138 mg/dL — ABNORMAL HIGH (ref 70–99)
Potassium: 5.3 mmol/L — ABNORMAL HIGH (ref 3.5–5.1)
Sodium: 136 mmol/L (ref 135–145)

## 2017-11-12 NOTE — Progress Notes (Addendum)
Stanley [name] to be D/C'd Home per MD order.  Pt rushing to leave. Pt removed own IV and refused vital signs. Pt stated his ride is here and can no longer wait. Tele box removed and returned.   Allergies as of 11/12/2017   No Known Allergies     Medication List    TAKE these medications   albuterol 108 (90 Base) MCG/ACT inhaler Commonly known as:  PROVENTIL HFA;VENTOLIN HFA Inhale 2 puffs into the lungs every 6 (six) hours as needed for wheezing or shortness of breath.   budesonide-formoterol 160-4.5 MCG/ACT inhaler Commonly known as:  SYMBICORT Inhale 2 puffs into the lungs daily.   calcium acetate 667 MG capsule Commonly known as:  PHOSLO Take 2,001 mg by mouth 3 (three) times daily.   DIALYVITE/ZINC Tabs Take 1 tablet by mouth daily.   ferrous sulfate 325 (65 FE) MG tablet Take 1 tablet by mouth 3 (three) times daily.   folic acid 1 MG tablet Commonly known as:  FOLVITE Take 1 tablet (1 mg total) by mouth daily.   gabapentin 300 MG capsule Commonly known as:  NEURONTIN Take 300 mg by mouth 2 (two) times daily.   ipratropium-albuterol 0.5-2.5 (3) MG/3ML Soln Commonly known as:  DUONEB Take 3 mLs by nebulization every 6 (six) hours as needed.   labetalol 100 MG tablet Commonly known as:  NORMODYNE Take 100 mg by mouth daily.   multivitamin Tabs tablet Take 1 tablet by mouth at bedtime.   nitroGLYCERIN 0.4 MG SL tablet Commonly known as:  NITROSTAT Place 1 tablet (0.4 mg total) under the tongue every 5 (five) minutes as needed.   omeprazole 40 MG capsule Commonly known as:  PRILOSEC Take 40 mg by mouth daily.   Spacer/Aero Chamber Mouthpiece Misc 1 Units by Does not apply route every 4 (four) hours as needed (wheezing).   tiotropium 18 MCG inhalation capsule Commonly known as:  SPIRIVA Place 1 capsule (18 mcg total) into inhaler and inhale daily.       Vitals:   11/12/17 0714 11/12/17 0757  BP:  (!) 141/102  Pulse: 78 82  Resp: 18   Temp:  97.7  F (36.5 C)  SpO2: 98% 97%     Rolley Sims

## 2017-11-16 DIAGNOSIS — N186 End stage renal disease: Secondary | ICD-10-CM | POA: Diagnosis not present

## 2017-11-16 DIAGNOSIS — D509 Iron deficiency anemia, unspecified: Secondary | ICD-10-CM | POA: Diagnosis not present

## 2017-11-16 DIAGNOSIS — Z992 Dependence on renal dialysis: Secondary | ICD-10-CM | POA: Diagnosis not present

## 2017-11-16 DIAGNOSIS — D631 Anemia in chronic kidney disease: Secondary | ICD-10-CM | POA: Diagnosis not present

## 2017-11-16 DIAGNOSIS — N2581 Secondary hyperparathyroidism of renal origin: Secondary | ICD-10-CM | POA: Diagnosis not present

## 2017-11-17 NOTE — Discharge Summary (Signed)
[place] [facility] [place] NAME: Stanley [name]    MR#:  [id number]  DATE OF BIRTH:  1961-03-01  DATE OF ADMISSION:  11/10/2017 ADMITTING PHYSICIAN: Loletha Grayer, MD  DATE OF DISCHARGE: 11/12/2017  9:50 AM  PRIMARY CARE PHYSICIAN: [name], MD   ADMISSION DIAGNOSIS:  Other ascites [R18.8] COPD exacerbation ([place]) [J44.1] Hyperkalemia ESRD on dialysis Chronic anemia Chronic diastolic heart failure DISCHARGE DIAGNOSIS:  Active Problems:   COPD exacerbation (H[facility]) cirrhosis of liver with ascites Chronic anemia Hyperkalemia Chronic diastolic heart failure  SECONDARY DIAGNOSIS:   Past Medical History:  Diagnosis Date  . Alcohol abuse   . CHF (congestive heart failure) ([place])   . Cirrhosis ([place])   . Coronary artery disease 2009  . Drug abuse ([place])   . End stage renal disease on dialysis [facility]) NEPHROLOGIST-   DR [facility]  IN [place]   HEMODIALYSIS --   TUES/  THURS/  SAT  . Gastrointestinal bleed 06/13/2017   From chart...hx of multiple GI bleeds  . GERD (gastroesophageal reflux disease)   . Hyperlipidemia   . Hypertension   . PAD (peripheral artery disease) ([place])   . Renal insufficiency    Per pt, 32 oz fluid restriction per day  . S/P triple vessel bypass 06/09/2016   2009ish  . Suicidal ideation    & HOMICIDAL IDEATION --  06-16-2013   ADMITTED TO BEHAVIOR HEALTH     ADMITTING HISTORY Stanley [name]  is a 57 y.o. male with a known history of COPD, end-stage renal disease presents with shortness of breath.  Patient states that he has more fluid on him.  He cannot breathe for the last 2 days.  He states his foot is hurting.  States he is itching all over and wants some Benadryl.  He states his abdomen is bigger than normal.  Hospitalist services were contacted for admission.    HOSPITAL COURSE:   Patient was admitted to telemetry.Patient received lokelma for hyperkalemia. He received IV solumedrol and nebulization  treatments in the hospital.Abdominal paracentesis was done and ascitic fluid was drained.Patients shortness of breath improved.Hyperkalemia resolved.Patient was seen and followed by nephrology. He received dialysis.Patient will be discharged home and follow up with nephrology as out patient.  CONSULTS OBTAINED:  Treatment Team:  Lavonia Dana, MD  DRUG ALLERGIES:  No Known Allergies  DISCHARGE MEDICATIONS:   Allergies as of 11/12/2017   No Known Allergies     Medication List    TAKE these medications   albuterol 108 (90 Base) MCG/ACT inhaler Commonly known as:  PROVENTIL HFA;VENTOLIN HFA Inhale 2 puffs into the lungs every 6 (six) hours as needed for wheezing or shortness of breath.   budesonide-formoterol 160-4.5 MCG/ACT inhaler Commonly known as:  SYMBICORT Inhale 2 puffs into the lungs daily.   calcium acetate 667 MG capsule Commonly known as:  PHOSLO Take 2,001 mg by mouth 3 (three) times daily.   DIALYVITE/ZINC Tabs Take 1 tablet by mouth daily.   ferrous sulfate 325 (65 FE) MG tablet Take 1 tablet by mouth 3 (three) times daily.   folic acid 1 MG tablet Commonly known as:  FOLVITE Take 1 tablet (1 mg total) by mouth daily.   gabapentin 300 MG capsule Commonly known as:  NEURONTIN Take 300 mg by mouth 2 (two) times daily.   ipratropium-albuterol 0.5-2.5 (3) MG/3ML Soln Commonly known as:  DUONEB Take 3 mLs by nebulization every 6 (six) hours as needed.   labetalol 100 MG  tablet Commonly known as:  NORMODYNE Take 100 mg by mouth daily.   multivitamin Tabs tablet Take 1 tablet by mouth at bedtime.   nitroGLYCERIN 0.4 MG SL tablet Commonly known as:  NITROSTAT Place 1 tablet (0.4 mg total) under the tongue every 5 (five) minutes as needed.   omeprazole 40 MG capsule Commonly known as:  PRILOSEC Take 40 mg by mouth daily.   Spacer/Aero Chamber Mouthpiece Misc 1 Units by Does not apply route every 4 (four) hours as needed (wheezing).   tiotropium 18  MCG inhalation capsule Commonly known as:  SPIRIVA Place 1 capsule (18 mcg total) into inhaler and inhale daily.       Today   Patient seen today No shortness of breath No wheezing Tolerated ultrasound guided paracentesis well 6.3 liter ascitic fluid drained  VITAL SIGNS:  Blood pressure (!) 141/102, pulse 82, temperature 97.7 F (36.5 C), temperature source Oral, resp. rate 18, height 6\' 3"  (1.905 m), weight 82.3 kg, SpO2 97 %.  I/O:  No intake or output data in the 24 hours ending 11/17/17 0946  PHYSICAL EXAMINATION:  Physical Exam  GENERAL:  57 y.o.-year-old patient lying in the bed with no acute distress.  LUNGS: Normal breath sounds bilaterally, no wheezing, rales,rhonchi or crepitation. No use of accessory muscles of respiration.  CARDIOVASCULAR: S1, S2 normal. No murmurs, rubs, or gallops.  ABDOMEN: Soft, non-tender, non-distended. Bowel sounds present. No organomegaly or mass.  NEUROLOGIC: Moves all 4 extremities. PSYCHIATRIC: The patient is alert and oriented x 3.  SKIN: No obvious rash, lesion, or ulcer.   DATA REVIEW:   CBC Recent Labs  Lab 11/11/17 0847  WBC 6.1  HGB 8.6*  HCT 28.3*  PLT 285    Chemistries  Recent Labs  Lab 11/10/17 1855  11/12/17 0614  NA 139   < > 136  K 5.8*   < > 5.3*  CL 103   < > 97*  CO2 28   < > 25  GLUCOSE 119*   < > 138*  BUN 51*   < > 41*  CREATININE 9.78*   < > 7.48*  CALCIUM 8.5*   < > 8.7*  AST 20  --   --   ALT 17  --   --   ALKPHOS 114  --   --   BILITOT 0.5  --   --    < > = values in this interval not displayed.    Cardiac Enzymes Recent Labs  Lab 11/10/17 1855  TROPONINI 0.05*    Microbiology Results  Results for orders placed or performed during the hospital encounter of 10/29/17  MRSA PCR Screening     Status: None   Collection Time: 10/30/17 12:28 AM  Result Value Ref Range Status   MRSA by PCR NEGATIVE NEGATIVE Final    Comment:        The GeneXpert MRSA Assay (FDA approved for NASAL  specimens only), is one component of a comprehensive MRSA colonization surveillance program. It is not intended to diagnose MRSA infection nor to guide or monitor treatment for MRSA infections. Performed at [facility], [address], [place], [place] [address]     RADIOLOGY:  No results found.  Follow up with PCP in 1 week.  Management plans discussed with the patient, family and they are in agreement.  CODE STATUS: Full code Code Status History    Date Active Date Inactive Code Status Order ID Comments User Context   10/30/2017 0007  10/31/2017 1724 Full Code [id number]  Amelia Jo, MD Inpatient   06/12/2017 1707 06/16/2017 1704 Full Code [id number]  Nicholes Mango, MD Inpatient   06/02/2017 1603 06/06/2017 2222 Full Code [id number]  Demetrios Loll, MD Inpatient   05/24/2017 1831 05/25/2017 1921 Full Code [id number]  Saundra Shelling, MD ED   05/03/2017 1331 05/05/2017 1806 Full Code [id number]  Bettey Costa, MD Inpatient   04/01/2017 0947 04/03/2017 1156 Full Code [id number]  Loletha Grayer, MD ED   03/09/2017 0010 03/09/2017 1928 Full Code [id number]  Lance Coon, MD Inpatient   03/03/2017 2027 03/04/2017 1920 Full Code [id number]  Demetrios Loll, MD Inpatient   02/28/2017 2254 03/02/2017 1519 Full Code [id number]  Lance Coon, MD Inpatient   02/22/2017 1721 02/24/2017 1359 Full Code [id number]  Dustin Flock, MD Inpatient   01/22/2017 2337 01/25/2017 1522 Full Code [id number]  Gorden Harms, MD Inpatient   01/07/2017 1445 01/13/2017 1812 Full Code [id number]  Idelle Crouch, MD Inpatient   12/22/2016 1431 12/24/2016 1804 Full Code [id number]  Hillary Bow, MD ED   12/20/2016 2020 12/21/2016 1853 Full Code [id number]  Demetrios Loll, MD Inpatient   11/26/2016 1030 11/28/2016 1522 Full Code [id number]  Henreitta Leber, MD Inpatient   11/15/2016 1930 11/16/2016 1822 Full Code [id number]  Fritzi Mandes, MD Inpatient   10/29/2016 1453 10/30/2016 1506 Full Code [id number]  Loletha Grayer, MD ED    09/15/2016 1757 09/16/2016 1817 Full Code [id number]  Dustin Flock, MD ED   08/16/2016 0945 08/18/2016 1816 Full Code [id number]  Hillary Bow, MD ED   08/09/2016 1659 08/12/2016 2034 Full Code [id number]  Fritzi Mandes, MD Inpatient   07/03/2016 2154 07/05/2016 2122 Full Code [id number]  Demetrios Loll, MD Inpatient   06/24/2016 0423 06/26/2016 1546 Full Code [id number]  Saundra Shelling, MD Inpatient   06/14/2016 1453 06/19/2016 2104 Full Code [id number]  Hillary Bow, MD ED   05/31/2016 0642 06/01/2016 2130 Full Code [id number]  Saundra Shelling, MD Inpatient   05/05/2016 2309 05/10/2016 2157 Full Code [id number]  Vianne Bulls, MD Inpatient   04/16/2016 0356 04/17/2016 1827 Full Code [id number]  Saundra Shelling, MD Inpatient   01/19/2016 1513 01/22/2016 1414 Full Code [id number]  Loletha Grayer, MD ED   09/24/2015 1033 09/25/2015 1331 Full Code [id number]  Loletha Grayer, MD ED   05/16/2015 1417 05/17/2015 2057 Full Code [id number]  Idelle Crouch, MD Inpatient   04/30/2015 2008 05/08/2015 1940 Full Code [id number]  Vaughan Basta, MD Inpatient   04/06/2015 0507 04/07/2015 1736 Full Code [id number]  Lance Coon, MD Inpatient   03/23/2015 1107 03/27/2015 1246 Full Code [id number]  Bettey Costa, MD Inpatient   06/19/2013 1132 06/21/2013 1749 Full Code [id number]  Cristal Ford, DO Inpatient   06/16/2013 1726 06/19/2013 1132 Full Code [id number]  Margarita Mail, PA-C ED   06/05/2013 1533 06/06/2013 1856 Full Code [id number]  Theodoro Kos, DO Inpatient   06/02/2013 0901 06/05/2013 1533 Full Code [id number]  Velvet Bathe, MD Inpatient   06/02/2013 0840 06/02/2013 0901 Full Code [id number]  Velvet Bathe, MD Inpatient      TOTAL TIME TAKING CARE OF THIS PATIENT ON DAY OF DISCHARGE: more than 35 minutes.   Saundra Shelling M.D on 11/17/2017 at 9:46 AM  Between 7am to 6pm - Pager - [phone]  After 6pm go to [email or web address] - password [email or web address] [facility]  Office  [phone]  CC: Primary care physician; [name], MD  Note: This dictation was prepared with Dragon dictation along with smaller phrase  technology. Any transcriptional errors that result from this process are unintentional.

## 2017-11-18 ENCOUNTER — Inpatient Hospital Stay
Admission: EM | Admit: 2017-11-18 | Discharge: 2017-11-20 | DRG: 640 | Disposition: A | Discharge status: Home / Self Care | Payer: Medicare Other | Attending: Internal Medicine | Admitting: Internal Medicine

## 2017-11-18 ENCOUNTER — Other Ambulatory Visit: Payer: Self-pay

## 2017-11-18 ENCOUNTER — Emergency Department: Payer: Medicare Other

## 2017-11-18 DIAGNOSIS — E875 Hyperkalemia: Secondary | ICD-10-CM | POA: Diagnosis present

## 2017-11-18 DIAGNOSIS — I739 Peripheral vascular disease, unspecified: Secondary | ICD-10-CM | POA: Diagnosis present

## 2017-11-18 DIAGNOSIS — Z7951 Long term (current) use of inhaled steroids: Secondary | ICD-10-CM

## 2017-11-18 DIAGNOSIS — R601 Generalized edema: Secondary | ICD-10-CM | POA: Diagnosis not present

## 2017-11-18 DIAGNOSIS — I132 Hypertensive heart and chronic kidney disease with heart failure and with stage 5 chronic kidney disease, or end stage renal disease: Secondary | ICD-10-CM | POA: Diagnosis present

## 2017-11-18 DIAGNOSIS — K7460 Unspecified cirrhosis of liver: Secondary | ICD-10-CM | POA: Diagnosis present

## 2017-11-18 DIAGNOSIS — E8770 Fluid overload, unspecified: Secondary | ICD-10-CM | POA: Diagnosis not present

## 2017-11-18 DIAGNOSIS — Z515 Encounter for palliative care: Secondary | ICD-10-CM | POA: Diagnosis present

## 2017-11-18 DIAGNOSIS — J449 Chronic obstructive pulmonary disease, unspecified: Secondary | ICD-10-CM | POA: Diagnosis present

## 2017-11-18 DIAGNOSIS — M79672 Pain in left foot: Secondary | ICD-10-CM | POA: Diagnosis present

## 2017-11-18 DIAGNOSIS — K219 Gastro-esophageal reflux disease without esophagitis: Secondary | ICD-10-CM | POA: Diagnosis present

## 2017-11-18 DIAGNOSIS — R188 Other ascites: Secondary | ICD-10-CM | POA: Diagnosis present

## 2017-11-18 DIAGNOSIS — Z992 Dependence on renal dialysis: Secondary | ICD-10-CM

## 2017-11-18 DIAGNOSIS — I272 Pulmonary hypertension, unspecified: Secondary | ICD-10-CM | POA: Diagnosis present

## 2017-11-18 DIAGNOSIS — I5032 Chronic diastolic (congestive) heart failure: Secondary | ICD-10-CM | POA: Diagnosis present

## 2017-11-18 DIAGNOSIS — N2581 Secondary hyperparathyroidism of renal origin: Secondary | ICD-10-CM | POA: Diagnosis present

## 2017-11-18 DIAGNOSIS — G8929 Other chronic pain: Secondary | ICD-10-CM | POA: Diagnosis present

## 2017-11-18 DIAGNOSIS — J811 Chronic pulmonary edema: Secondary | ICD-10-CM | POA: Diagnosis not present

## 2017-11-18 DIAGNOSIS — R609 Edema, unspecified: Secondary | ICD-10-CM | POA: Diagnosis not present

## 2017-11-18 DIAGNOSIS — E785 Hyperlipidemia, unspecified: Secondary | ICD-10-CM | POA: Diagnosis present

## 2017-11-18 DIAGNOSIS — M79606 Pain in leg, unspecified: Secondary | ICD-10-CM | POA: Diagnosis present

## 2017-11-18 DIAGNOSIS — M7989 Other specified soft tissue disorders: Secondary | ICD-10-CM | POA: Diagnosis not present

## 2017-11-18 DIAGNOSIS — Z720 Tobacco use: Secondary | ICD-10-CM | POA: Diagnosis not present

## 2017-11-18 DIAGNOSIS — J984 Other disorders of lung: Secondary | ICD-10-CM | POA: Diagnosis not present

## 2017-11-18 DIAGNOSIS — N186 End stage renal disease: Secondary | ICD-10-CM | POA: Diagnosis present

## 2017-11-18 DIAGNOSIS — Z951 Presence of aortocoronary bypass graft: Secondary | ICD-10-CM

## 2017-11-18 DIAGNOSIS — I2510 Atherosclerotic heart disease of native coronary artery without angina pectoris: Secondary | ICD-10-CM | POA: Diagnosis present

## 2017-11-18 DIAGNOSIS — D631 Anemia in chronic kidney disease: Secondary | ICD-10-CM | POA: Diagnosis present

## 2017-11-18 DIAGNOSIS — Z79899 Other long term (current) drug therapy: Secondary | ICD-10-CM

## 2017-11-18 DIAGNOSIS — M79605 Pain in left leg: Secondary | ICD-10-CM

## 2017-11-18 DIAGNOSIS — M79662 Pain in left lower leg: Secondary | ICD-10-CM | POA: Diagnosis not present

## 2017-11-18 DIAGNOSIS — F17210 Nicotine dependence, cigarettes, uncomplicated: Secondary | ICD-10-CM | POA: Diagnosis present

## 2017-11-18 DIAGNOSIS — Z9119 Patient's noncompliance with other medical treatment and regimen: Secondary | ICD-10-CM | POA: Diagnosis not present

## 2017-11-18 DIAGNOSIS — R200 Anesthesia of skin: Secondary | ICD-10-CM | POA: Diagnosis not present

## 2017-11-18 LAB — CBC WITH DIFFERENTIAL/PLATELET
Basophils Absolute: 0.1 10*3/uL (ref 0–0.1)
Basophils Relative: 1 %
EOS ABS: 0.1 10*3/uL (ref 0–0.7)
Eosinophils Relative: 2 %
HEMATOCRIT: 27.9 % — AB (ref 40.0–52.0)
HEMOGLOBIN: 8.5 g/dL — AB (ref 13.0–18.0)
LYMPHS ABS: 0.7 10*3/uL — AB (ref 1.0–3.6)
LYMPHS PCT: 9 %
MCH: 23.4 pg — ABNORMAL LOW (ref 26.0–34.0)
MCHC: 30.5 g/dL — ABNORMAL LOW (ref 32.0–36.0)
MCV: 76.6 fL — AB (ref 80.0–100.0)
MONOS PCT: 11 %
Monocytes Absolute: 0.9 10*3/uL (ref 0.2–1.0)
NEUTROS ABS: 6.1 10*3/uL (ref 1.4–6.5)
Neutrophils Relative %: 77 %
PLATELETS: 308 10*3/uL (ref 150–440)
RBC: 3.65 MIL/uL — AB (ref 4.40–5.90)
RDW: 22.5 % — AB (ref 11.5–14.5)
WBC: 7.8 10*3/uL (ref 3.8–10.6)

## 2017-11-18 LAB — BASIC METABOLIC PANEL
ANION GAP: 10 (ref 5–15)
BUN: 56 mg/dL — ABNORMAL HIGH (ref 6–20)
CHLORIDE: 106 mmol/L (ref 98–111)
CO2: 23 mmol/L (ref 22–32)
CREATININE: 10.69 mg/dL — AB (ref 0.61–1.24)
Calcium: 8.5 mg/dL — ABNORMAL LOW (ref 8.9–10.3)
GFR calc non Af Amer: 5 mL/min — ABNORMAL LOW (ref >60)
GFR, EST AFRICAN AMERICAN: 5 mL/min — AB (ref >60)
Glucose, Bld: 128 mg/dL — ABNORMAL HIGH (ref 70–99)
POTASSIUM: 6.8 mmol/L — AB (ref 3.5–5.1)
SODIUM: 139 mmol/L (ref 135–145)

## 2017-11-18 LAB — ETHANOL

## 2017-11-18 LAB — LACTIC ACID, PLASMA: Lactic Acid, Venous: 1.6 mmol/L (ref 0.5–1.9)

## 2017-11-18 LAB — GLUCOSE, CAPILLARY: Glucose-Capillary: 120 mg/dL — ABNORMAL HIGH (ref 70–99)

## 2017-11-18 LAB — PHOSPHORUS: PHOSPHORUS: 4.6 mg/dL (ref 2.5–4.6)

## 2017-11-18 LAB — POTASSIUM: Potassium: 5.5 mmol/L — ABNORMAL HIGH (ref 3.5–5.1)

## 2017-11-18 MED ORDER — DEXTROSE 50 % IV SOLN
50.0000 mL | Freq: Once | INTRAVENOUS | Status: AC
  Administered 2017-11-18: 50 mL via INTRAVENOUS
  Filled 2017-11-18: qty 50

## 2017-11-18 MED ORDER — ALBUTEROL SULFATE (2.5 MG/3ML) 0.083% IN NEBU: 3.0000 mL | INHALATION_SOLUTION | Freq: Four times a day (QID) | RESPIRATORY_TRACT | Status: DC | PRN

## 2017-11-18 MED ORDER — SODIUM CHLORIDE 0.9% FLUSH
3.0000 mL | Freq: Two times a day (BID) | INTRAVENOUS | Status: DC
  Administered 2017-11-19 – 2017-11-20 (×4): 3 mL via INTRAVENOUS

## 2017-11-18 MED ORDER — ACETAMINOPHEN 650 MG RE SUPP: 650.0000 mg | Freq: Four times a day (QID) | RECTAL | Status: DC | PRN

## 2017-11-18 MED ORDER — PATIROMER SORBITEX CALCIUM 8.4 G PO PACK
8.4000 g | PACK | Freq: Every day | ORAL | Status: DC
  Administered 2017-11-18 – 2017-11-19 (×2): 8.4 g via ORAL
  Filled 2017-11-18 (×6): qty 1

## 2017-11-18 MED ORDER — INSULIN ASPART 100 UNIT/ML ~~LOC~~ SOLN
10.0000 [IU] | Freq: Once | SUBCUTANEOUS | Status: AC
  Administered 2017-11-18: 10 [IU] via INTRAVENOUS
  Filled 2017-11-18: qty 1

## 2017-11-18 MED ORDER — CALCIUM ACETATE (PHOS BINDER) 667 MG PO CAPS
2001.0000 mg | ORAL_CAPSULE | Freq: Three times a day (TID) | ORAL | Status: DC
  Administered 2017-11-19 – 2017-11-20 (×4): 2001 mg via ORAL
  Filled 2017-11-18 (×5): qty 3

## 2017-11-18 MED ORDER — SODIUM CHLORIDE 0.9% FLUSH: 3.0000 mL | INTRAVENOUS | Status: DC | PRN

## 2017-11-18 MED ORDER — MOMETASONE FURO-FORMOTEROL FUM 200-5 MCG/ACT IN AERO
2.0000 | INHALATION_SPRAY | Freq: Two times a day (BID) | RESPIRATORY_TRACT | Status: DC
  Administered 2017-11-19 – 2017-11-20 (×3): 2 via RESPIRATORY_TRACT
  Filled 2017-11-18: qty 8.8

## 2017-11-18 MED ORDER — SODIUM BICARBONATE 8.4 % IV SOLN
50.0000 meq | Freq: Once | INTRAVENOUS | Status: AC
  Administered 2017-11-18: 50 meq via INTRAVENOUS
  Filled 2017-11-18: qty 50

## 2017-11-18 MED ORDER — ONDANSETRON HCL 4 MG PO TABS
4.0000 mg | ORAL_TABLET | Freq: Four times a day (QID) | ORAL | Status: DC | PRN
  Administered 2017-11-19: 4 mg via ORAL
  Filled 2017-11-18: qty 1

## 2017-11-18 MED ORDER — RENA-VITE PO TABS
1.0000 | ORAL_TABLET | Freq: Every day | ORAL | Status: DC
  Filled 2017-11-18: qty 1

## 2017-11-18 MED ORDER — RENA-VITE PO TABS
1.0000 | ORAL_TABLET | Freq: Every day | ORAL | Status: DC
  Administered 2017-11-19: 1 via ORAL
  Filled 2017-11-18 (×3): qty 1

## 2017-11-18 MED ORDER — NITROGLYCERIN 0.4 MG SL SUBL: 0.4000 mg | SUBLINGUAL_TABLET | SUBLINGUAL | Status: DC | PRN

## 2017-11-18 MED ORDER — ONDANSETRON HCL 4 MG/2ML IJ SOLN: 4.0000 mg | Freq: Four times a day (QID) | INTRAMUSCULAR | Status: DC | PRN

## 2017-11-18 MED ORDER — OXYCODONE-ACETAMINOPHEN 5-325 MG PO TABS
1.0000 | ORAL_TABLET | Freq: Four times a day (QID) | ORAL | Status: DC | PRN
  Administered 2017-11-19 – 2017-11-20 (×6): 1 via ORAL
  Filled 2017-11-18 (×6): qty 1

## 2017-11-18 MED ORDER — CHLORHEXIDINE GLUCONATE CLOTH 2 % EX PADS
6.0000 | MEDICATED_PAD | Freq: Every day | CUTANEOUS | Status: DC
  Administered 2017-11-19 – 2017-11-20 (×2): 6 via TOPICAL

## 2017-11-18 MED ORDER — IPRATROPIUM-ALBUTEROL 0.5-2.5 (3) MG/3ML IN SOLN: 3.0000 mL | Freq: Four times a day (QID) | RESPIRATORY_TRACT | Status: DC | PRN

## 2017-11-18 MED ORDER — SODIUM CHLORIDE 0.9 % IV SOLN
1.0000 g | Freq: Once | INTRAVENOUS | Status: AC
  Administered 2017-11-18: 1 g via INTRAVENOUS
  Filled 2017-11-18: qty 10

## 2017-11-18 MED ORDER — SODIUM CHLORIDE 0.9 % IV SOLN: 250.0000 mL | INTRAVENOUS | Status: DC | PRN

## 2017-11-18 MED ORDER — EPOETIN ALFA 4000 UNIT/ML IJ SOLN
4000.0000 [IU] | INTRAMUSCULAR | Status: DC
  Administered 2017-11-18 – 2017-11-20 (×2): 4000 [IU] via INTRAVENOUS

## 2017-11-18 MED ORDER — LABETALOL HCL 100 MG PO TABS
100.0000 mg | ORAL_TABLET | Freq: Every day | ORAL | Status: DC
  Administered 2017-11-19 – 2017-11-20 (×2): 100 mg via ORAL
  Filled 2017-11-18 (×3): qty 1

## 2017-11-18 MED ORDER — FERROUS SULFATE 325 (65 FE) MG PO TABS
325.0000 mg | ORAL_TABLET | Freq: Three times a day (TID) | ORAL | Status: DC
  Administered 2017-11-19 – 2017-11-20 (×5): 325 mg via ORAL
  Filled 2017-11-18 (×5): qty 1

## 2017-11-18 MED ORDER — SENNOSIDES-DOCUSATE SODIUM 8.6-50 MG PO TABS: 1.0000 | ORAL_TABLET | Freq: Every evening | ORAL | Status: DC | PRN

## 2017-11-18 MED ORDER — OXYCODONE-ACETAMINOPHEN 5-325 MG PO TABS
1.0000 | ORAL_TABLET | Freq: Once | ORAL | Status: AC
  Administered 2017-11-18: 1 via ORAL
  Filled 2017-11-18: qty 1

## 2017-11-18 MED ORDER — SPACER/AERO CHAMBER MOUTHPIECE MISC: 1.0000 [IU] | Status: DC | PRN

## 2017-11-18 MED ORDER — GABAPENTIN 300 MG PO CAPS
300.0000 mg | ORAL_CAPSULE | Freq: Two times a day (BID) | ORAL | Status: DC
  Administered 2017-11-19 – 2017-11-20 (×4): 300 mg via ORAL
  Filled 2017-11-18 (×4): qty 1

## 2017-11-18 MED ORDER — ALBUTEROL SULFATE (2.5 MG/3ML) 0.083% IN NEBU
5.0000 mg | INHALATION_SOLUTION | Freq: Once | RESPIRATORY_TRACT | Status: AC
  Administered 2017-11-18: 5 mg via RESPIRATORY_TRACT
  Filled 2017-11-18: qty 6

## 2017-11-18 MED ORDER — PANTOPRAZOLE SODIUM 40 MG PO TBEC
40.0000 mg | DELAYED_RELEASE_TABLET | Freq: Every day | ORAL | Status: DC
  Administered 2017-11-19 – 2017-11-20 (×3): 40 mg via ORAL
  Filled 2017-11-18 (×3): qty 1

## 2017-11-18 MED ORDER — ACETAMINOPHEN 325 MG PO TABS: 650.0000 mg | ORAL_TABLET | Freq: Four times a day (QID) | ORAL | Status: DC | PRN

## 2017-11-18 MED ORDER — FOLIC ACID 1 MG PO TABS
1.0000 mg | ORAL_TABLET | Freq: Every day | ORAL | Status: DC
  Administered 2017-11-19 – 2017-11-20 (×3): 1 mg via ORAL
  Filled 2017-11-18 (×3): qty 1

## 2017-11-18 MED ORDER — HEPARIN SODIUM (PORCINE) 5000 UNIT/ML IJ SOLN
5000.0000 [IU] | Freq: Three times a day (TID) | INTRAMUSCULAR | Status: DC
  Administered 2017-11-19 – 2017-11-20 (×5): 5000 [IU] via SUBCUTANEOUS
  Filled 2017-11-18 (×5): qty 1

## 2017-11-18 MED ORDER — HEPARIN SODIUM (PORCINE) 1000 UNIT/ML DIALYSIS
20.0000 [IU]/kg | INTRAMUSCULAR | Status: DC | PRN
  Filled 2017-11-18: qty 2

## 2017-11-18 NOTE — Progress Notes (Signed)
Advanced care plan.  Purpose of the Encounter: CODE STATUS  Parties in Attendance: Patient  Patient's Decision Capacity: Good  Subjective/Patient's story: Presented to the emergency room with fluid overload   Objective/Medical story Patient missed dialysis Needs dialysis Has to be assessed for abdominal paracentesis   Goals of care determination:  Advance care directives and goals of care discussed Patient wants everything done which includes CPR, intubation ventilator if the need arises   CODE STATUS: Full code   Time spent discussing advanced care planning: 16 minutes

## 2017-11-18 NOTE — ED Triage Notes (Addendum)
Pt arrives via ems from home, pt reports missing dialysis this am, reports increased swelling to his abd and his ankles. Pt reports some diff breathing. Pt reports sensation of his left foot going to sleep for the past 2 days, skin warm and dry to touch in comparison to the right foot, cap refill within normal limits. Swelling noted to the right foot and lower extremity

## 2017-11-18 NOTE — H&P (Signed)
[facility] [place] NAME: Stanley [name]    MR#:  [id number]  DATE OF BIRTH:  26-Jun-1960  DATE OF ADMISSION:  11/18/2017  PRIMARY CARE PHYSICIAN: Alene Mires Elyse Jarvis, MD   REQUESTING/REFERRING PHYSICIAN:   CHIEF COMPLAINT:   Chief Complaint  Patient presents with  . Leg Swelling  . Bloated    HISTORY OF PRESENT ILLNESS: Stanley [name]  is a 57 y.o. male with a known history of cirrhosis of liver, chronic diastolic heart failure, end-stage renal disease on dialysis, COPD, coronary artery disease presented to the emergency room with lower extremity edema secondary to fluid retention.  Patient also complains of abdominal bloating secondary to fluid.  He recently was discharged from our hospital.  He had abdominal paracentesis is 6.3 L of ascitic fluid was removed.  He was evaluated in the emergency room he missed his dialysis today.  His potassium is 6.8 and patient was given oral Veltassa, IV sodium bicarbonate, IV calcium gluconate and dextrose in the emergency room.  Hospitalist service was consulted for further care.  Patient also complains of some pain in the left lower extremity.  Pain is aching in nature 3 out of 10 on a scale of 1-10.  Pending Doppler ultrasound of the left lower extremity.  PAST MEDICAL HISTORY:   Past Medical History:  Diagnosis Date  . Alcohol abuse   . CHF (congestive heart failure) ([place])   . Cirrhosis ([place])   . Coronary artery disease 2009  . Drug abuse ([facility])   . End stage renal disease on dialysis [facility]) NEPHROLOGIST-   [facility]  IN [place]   HEMODIALYSIS --   TUES/  THURS/  SAT  . Gastrointestinal bleed 06/13/2017   From chart...hx of multiple GI bleeds  . GERD (gastroesophageal reflux disease)   . Hyperlipidemia   . Hypertension   . PAD (peripheral artery disease) ([place])   . Renal insufficiency    Per pt, 32 oz fluid restriction per day  . S/P triple vessel bypass 06/09/2016   2009ish  .  Suicidal ideation    & HOMICIDAL IDEATION --  06-16-2013   ADMITTED TO BEHAVIOR HEALTH    PAST SURGICAL HISTORY:  Past Surgical History:  Procedure Laterality Date  . A/V FISTULAGRAM Right 06/06/2017   Procedure: A/V FISTULAGRAM;  Surgeon: Katha Cabal, MD;  Location: [facility];  Service: Cardiovascular;  Laterality: Right;  . A/V SHUNT INTERVENTION N/A 06/06/2017   Procedure: A/V SHUNT INTERVENTION;  Surgeon: Katha Cabal, MD;  Location: [facility];  Service: Cardiovascular;  Laterality: N/A;  . AGILE CAPSULE N/A 06/19/2016   Procedure: AGILE CAPSULE;  Surgeon: [name], MD;  Location: [facility] ENDOSCOPY;  Service: Endoscopy;  Laterality: N/A;  . COLONOSCOPY WITH PROPOFOL N/A 06/18/2016   Procedure: COLONOSCOPY WITH PROPOFOL;  Surgeon: [name], MD;  Location: [facility] ENDOSCOPY;  Service: Endoscopy;  Laterality: N/A;  . COLONOSCOPY WITH PROPOFOL N/A 08/12/2016   Procedure: COLONOSCOPY WITH PROPOFOL;  Surgeon: [name], MD;  Location: [facility] ENDOSCOPY;  Service: Endoscopy;  Laterality: N/A;  . COLONOSCOPY WITH PROPOFOL N/A 05/05/2017   Procedure: COLONOSCOPY WITH PROPOFOL;  Surgeon: Manya Silvas, MD;  Location: [facility] ENDOSCOPY;  Service: Endoscopy;  Laterality: N/A;  . CORONARY ANGIOPLASTY  ?   PT UNABLE TO TELL IF  BEFORE OR AFTER  CABG  . CORONARY ARTERY BYPASS GRAFT  2008  ([place] , [place])   3 VESSEL  . DIALYSIS FISTULA CREATION  LAST SURGERY  APPOX  2008  . ENTEROSCOPY N/A 05/10/2016   Procedure: ENTEROSCOPY;  Surgeon: Jerene Bears, MD;  Location: [place];  Service: Gastroenterology;  Laterality: N/A;  . ENTEROSCOPY N/A 08/12/2016   Procedure: ENTEROSCOPY;  Surgeon: [name], MD;  Location: [facility] ENDOSCOPY;  Service: Endoscopy;  Laterality: N/A;  . ENTEROSCOPY Left 06/03/2017   Procedure: ENTEROSCOPY;  Surgeon: Virgel Manifold, MD;  Location: [facility] ENDOSCOPY;  Service: Endoscopy;  Laterality: Left;  Procedure date will ultimately depend on when  patient is medically optimized before the procedure, pending hemodialysis and blood transfusions etc. Will place on schedule and change depending on clinical status.   . ENTEROSCOPY N/A 06/05/2017   Procedure: ENTEROSCOPY;  Surgeon: Virgel Manifold, MD;  Location: [facility] ENDOSCOPY;  Service: Endoscopy;  Laterality: N/A;  . ENTEROSCOPY N/A 06/15/2017   Procedure: Push ENTEROSCOPY;  Surgeon: [name], MD;  Location: [facility] ENDOSCOPY;  Service: Endoscopy;  Laterality: N/A;  . ESOPHAGOGASTRODUODENOSCOPY N/A 05/07/2015   Procedure: ESOPHAGOGASTRODUODENOSCOPY (EGD);  Surgeon: Hulen Luster, MD;  Location: [facility] ENDOSCOPY;  Service: Endoscopy;  Laterality: N/A;  . ESOPHAGOGASTRODUODENOSCOPY (EGD) WITH PROPOFOL N/A 05/17/2015   Procedure: ESOPHAGOGASTRODUODENOSCOPY (EGD) WITH PROPOFOL;  Surgeon: [name], MD;  Location: [facility] ENDOSCOPY;  Service: Endoscopy;  Laterality: N/A;  . ESOPHAGOGASTRODUODENOSCOPY (EGD) WITH PROPOFOL N/A 01/20/2016   Procedure: ESOPHAGOGASTRODUODENOSCOPY (EGD) WITH PROPOFOL;  Surgeon: [name], MD;  Location: [facility] ENDOSCOPY;  Service: Endoscopy;  Laterality: N/A;  . ESOPHAGOGASTRODUODENOSCOPY (EGD) WITH PROPOFOL N/A 04/17/2016   Procedure: ESOPHAGOGASTRODUODENOSCOPY (EGD) WITH PROPOFOL;  Surgeon: Lin Landsman, MD;  Location: [facility] ENDOSCOPY;  Service: Gastroenterology;  Laterality: N/A;  . ESOPHAGOGASTRODUODENOSCOPY (EGD) WITH PROPOFOL  05/09/2016   Procedure: ESOPHAGOGASTRODUODENOSCOPY (EGD) WITH PROPOFOL;  Surgeon: Jerene Bears, MD;  Location: [place];  Service: Endoscopy;;  . ESOPHAGOGASTRODUODENOSCOPY (EGD) WITH PROPOFOL N/A 06/16/2016   Procedure: ESOPHAGOGASTRODUODENOSCOPY (EGD) WITH PROPOFOL;  Surgeon: [name], MD;  Location: [facility] ENDOSCOPY;  Service: Endoscopy;  Laterality: N/A;  . ESOPHAGOGASTRODUODENOSCOPY (EGD) WITH PROPOFOL N/A 05/05/2017   Procedure: ESOPHAGOGASTRODUODENOSCOPY (EGD) WITH PROPOFOL;  Surgeon: Manya Silvas, MD;  Location: [facility] ENDOSCOPY;   Service: Endoscopy;  Laterality: N/A;  . ESOPHAGOGASTRODUODENOSCOPY (EGD) WITH PROPOFOL N/A 06/15/2017   Procedure: ESOPHAGOGASTRODUODENOSCOPY (EGD) WITH PROPOFOL;  Surgeon: [name], MD;  Location: [facility] ENDOSCOPY;  Service: Endoscopy;  Laterality: N/A;  . GIVENS CAPSULE STUDY N/A 05/07/2016   Procedure: GIVENS CAPSULE STUDY;  Surgeon: Doran Stabler, MD;  Location: [place];  Service: Endoscopy;  Laterality: N/A;  . MANDIBULAR HARDWARE REMOVAL N/A 07/29/2013   Procedure: REMOVAL OF ARCH BARS;  Surgeon: Theodoro Kos, DO;  Location: [place];  Service: Plastics;  Laterality: N/A;  . ORIF MANDIBULAR FRACTURE N/A 06/05/2013   Procedure: REPAIR OF MANDIBULAR FRACTURE x 2 with maxillo-mandibular fixation ;  Surgeon: Theodoro Kos, DO;  Location: [place];  Service: Plastics;  Laterality: N/A;  . PARACENTESIS    . PERIPHERAL ARTERIAL STENT GRAFT Left     SOCIAL HISTORY:  Social History   Tobacco Use  . Smoking status: Current Every Day Smoker    Packs/day: 0.15    Years: 40.00    Pack years: 6.00    Types: Cigarettes  . Smokeless tobacco: Never Used  Substance Use Topics  . Alcohol use: No    Comment: pt reports quitting after learning about cirrhosis    FAMILY HISTORY:  Family History  Problem Relation Age of Onset  . Colon cancer Mother   . Cancer Father   .  Cancer Sister   . Kidney disease Brother     DRUG ALLERGIES: No Known Allergies  REVIEW OF SYSTEMS:   CONSTITUTIONAL: No fever,has fatigue and weakness.  EYES: No blurred or double vision.  EARS, NOSE, AND THROAT: No tinnitus or ear pain.  RESPIRATORY: No cough, shortness of breath, wheezing or hemoptysis.  CARDIOVASCULAR: No chest pain, orthopnea, has edema.  GASTROINTESTINAL: No nausea, vomiting, diarrhea or abdominal pain.  Abdominal distension GENITOURINARY: No dysuria, hematuria.  ENDOCRINE: No polyuria, nocturia,  HEMATOLOGY: No anemia, easy bruising or bleeding SKIN: No rash or  lesion. MUSCULOSKELETAL: No joint pain or arthritis.   NEUROLOGIC: No tingling, numbness, weakness.  PSYCHIATRY: No anxiety or depression.   MEDICATIONS AT HOME:  Prior to Admission medications   Medication Sig Start Date End Date Taking? Authorizing Provider  albuterol (PROVENTIL HFA;VENTOLIN HFA) 108 (90 Base) MCG/ACT inhaler Inhale 2 puffs into the lungs every 6 (six) hours as needed for wheezing or shortness of breath. 04/21/17  Yes Lisa Roca, MD  B Complex-C-Zn-Folic Acid (DIALYVITE/ZINC) TABS Take 1 tablet by mouth daily. 09/08/17  Yes [provider]  budesonide-formoterol (SYMBICORT) 160-4.5 MCG/ACT inhaler Inhale 2 puffs into the lungs daily. 11/16/16  Yes Vaughan Basta, MD  calcium acetate (PHOSLO) 667 MG capsule Take 2,001 mg by mouth 3 (three) times daily.   Yes [provider]  ferrous sulfate 325 (65 FE) MG tablet Take 1 tablet by mouth 3 (three) times daily. 01/21/17  Yes [provider]  folic acid (FOLVITE) 1 MG tablet Take 1 tablet (1 mg total) by mouth daily. 10/31/17  Yes Fritzi Mandes, MD  gabapentin (NEURONTIN) 300 MG capsule Take 300 mg by mouth 2 (two) times daily.  08/16/16  Yes [provider]  ipratropium-albuterol (DUONEB) 0.5-2.5 (3) MG/3ML SOLN Take 3 mLs by nebulization every 6 (six) hours as needed. 11/28/16  Yes Wieting, Richard, MD  labetalol (NORMODYNE) 100 MG tablet Take 100 mg by mouth daily. 03/24/17  Yes [provider]  multivitamin (RENA-VIT) TABS tablet Take 1 tablet by mouth at bedtime. 10/31/17  Yes Fritzi Mandes, MD  nitroGLYCERIN (NITROSTAT) 0.4 MG SL tablet Place 1 tablet (0.4 mg total) under the tongue every 5 (five) minutes as needed. 04/12/16  Yes Wende Bushy, MD  omeprazole (PRILOSEC) 40 MG capsule Take 40 mg by mouth daily. 09/08/17  Yes [provider]  tiotropium (SPIRIVA HANDIHALER) 18 MCG inhalation capsule Place 1 capsule (18 mcg total) into inhaler and inhale daily. 11/16/16 11/18/17 Yes  Vaughan Basta, MD  Spacer/Aero Chamber Mouthpiece MISC 1 Units by Does not apply route every 4 (four) hours as needed (wheezing). 02/19/17   Darel Hong, MD      PHYSICAL EXAMINATION:   VITAL SIGNS: Blood pressure 120/78, pulse 83, temperature 98.2 F (36.8 C), temperature source Oral, resp. rate 20, height 6\' 3"  (1.905 m), weight 81.6 kg, SpO2 96 %.  GENERAL:  57 y.o.-year-old patient lying in the bed with no acute distress.  EYES: Pupils equal, round, reactive to light and accommodation. No scleral icterus. Extraocular muscles intact.  HEENT: Head atraumatic, normocephalic. Oropharynx and nasopharynx clear.  NECK:  Supple, no jugular venous distention. No thyroid enlargement, no tenderness.  LUNGS: Normal breath sounds bilaterally, no wheezing, rales,rhonchi or crepitation. No use of accessory muscles of respiration.  CARDIOVASCULAR: S1, S2 normal. No murmurs, rubs, or gallops.  ABDOMEN: Soft, nontender, distended. Bowel sounds present. No organomegaly or mass.  EXTREMITIES: Has pedal edema, no cyanosis, or clubbing. Left lower extremity pain noted  NEUROLOGIC: Cranial nerves II through XII are intact. Muscle strength 5/5 in all extremities. Sensation intact. Gait not checked.  PSYCHIATRIC: The patient is alert and oriented x 3.  SKIN: No obvious rash, lesion, or ulcer.   LABORATORY PANEL:   CBC Recent Labs  Lab 11/18/17 1705  WBC 7.8  HGB 8.5*  HCT 27.9*  PLT 308  MCV 76.6*  MCH 23.4*  MCHC 30.5*  RDW 22.5*  LYMPHSABS 0.7*  MONOABS 0.9  EOSABS 0.1  BASOSABS 0.1   ------------------------------------------------------------------------------------------------------------------  Chemistries  Recent Labs  Lab 11/12/17 0614 11/18/17 1705  NA 136 139  K 5.3* 6.8*  CL 97* 106  CO2 25 23  GLUCOSE 138* 128*  BUN 41* 56*  CREATININE 7.48* 10.69*  CALCIUM 8.7* 8.5*    ------------------------------------------------------------------------------------------------------------------ estimated creatinine clearance is 8.7 mL/min (A) (by C-G formula based on SCr of 10.69 mg/dL (H)). ------------------------------------------------------------------------------------------------------------------ No results for input(s): TSH, T4TOTAL, T3FREE, THYROIDAB in the last 72 hours.  Invalid input(s): FREET3   Coagulation profile No results for input(s): INR, PROTIME in the last 168 hours. ------------------------------------------------------------------------------------------------------------------- No results for input(s): DDIMER in the last 72 hours. -------------------------------------------------------------------------------------------------------------------  Cardiac Enzymes No results for input(s): CKMB, TROPONINI, MYOGLOBIN in the last 168 hours.  Invalid input(s): CK ------------------------------------------------------------------------------------------------------------------ Invalid input(s): POCBNP  ---------------------------------------------------------------------------------------------------------------  Urinalysis    Component Value Date/Time   COLORURINE YELLOW (A) 05/03/2015 0815   APPEARANCEUR CLEAR (A) 05/03/2015 0815   APPEARANCEUR Clear 11/13/2013 1314   LABSPEC 1.008 05/03/2015 0815   LABSPEC 1.014 11/13/2013 1314   PHURINE 9.0 (H) 05/03/2015 0815   GLUCOSEU NEGATIVE 05/03/2015 0815   GLUCOSEU 50 mg/dL 11/13/2013 1314   HGBUR NEGATIVE 05/03/2015 0815   BILIRUBINUR NEGATIVE 05/03/2015 0815   BILIRUBINUR Negative 11/13/2013 1314   KETONESUR NEGATIVE 05/03/2015 0815   PROTEINUR 100 (A) 05/03/2015 0815   UROBILINOGEN 0.2 06/22/2014 1230   NITRITE NEGATIVE 05/03/2015 0815   LEUKOCYTESUR NEGATIVE 05/03/2015 0815   LEUKOCYTESUR Negative 11/13/2013 1314     RADIOLOGY: Dg Chest 2 View  Result Date: 11/18/2017 CLINICAL  DATA:  Leg swelling EXAM: CHEST - 2 VIEW COMPARISON:  11/10/2017 chest radiograph. FINDINGS: Right internal jugular central venous catheter terminates in the lower third of the SVC. Intact sternotomy wires. CABG clips overlie the mediastinum. Stable cardiomediastinal silhouette with mild cardiomegaly. No pneumothorax. No pleural effusion. No overt pulmonary edema. No acute consolidative airspace disease. Mild bibasilar scarring versus atelectasis. IMPRESSION: 1. Stable cardiomegaly without overt pulmonary edema. 2. Mild bibasilar scarring versus atelectasis. Electronically Signed   By: Ilona Sorrel M.D.   On: 11/18/2017 18:25   Dg Foot Complete Left  Result Date: 11/18/2017 CLINICAL DATA:  Thickening and numbness of left foot. EXAM: LEFT FOOT - COMPLETE 3+ VIEW COMPARISON:  10/29/2016 FINDINGS: There is no evidence of fracture or dislocation. There is no evidence of arthropathy or other focal bone abnormality. Vascular calcifications noted. IMPRESSION: No acute fracture or dislocation identified about the left foot. Electronically Signed   By: Fidela Salisbury M.D.   On: 11/18/2017 18:13    EKG: Orders placed or performed during the hospital encounter of 11/18/17  . EKG 12-Lead  . EKG 12-Lead  . ED EKG  . ED EKG  . EKG 12-Lead  . EKG 12-Lead    IMPRESSION AND PLAN:  57 year old male patient with history of chronic diastolic heart failure, end-stage renal disease on dialysis, COPD, tobacco abuse, cirrhosis of liver, coronary artery disease presented to the emergency room for edema in the lower extremities  and abdominal bloating and distention  -Fluid overload Needs dialysis  -Acute hyperkalemia Oral Veltassa given in the emergency room IV dextrose, IV calcium gluconate and sodium bicarbonate given in the ER Follow potassium level Nephrology consultation for dialysis  -Cirrhosis of liver with ascites Recently had paracentesis earlier this week Will reassess in the morning again after  dialysis for paracentesis  -End-stage renal disease Continue dialysis as per nephrology  -Tobacco abuse Tobacco cessation counseled to the patient for 6 minutes And nicotine patch offered  -Chronic diastolic heart failure Medical management to continue Fluid management as per dialysis  -DVT prophylaxis with subcu heparin daily  -Left lower extremity pain Follow-up Doppler ultrasound to assess for any DVT  All the records are reviewed and case discussed with ED provider. Management plans discussed with the patient, family and they are in agreement.  CODE STATUS:Full code Code Status History    Date Active Date Inactive Code Status Order ID Comments User Context   10/30/2017 0007 10/31/2017 1724 Full Code [id number]  Amelia Jo, MD Inpatient   06/12/2017 1707 06/16/2017 1704 Full Code [id number]  Nicholes Mango, MD Inpatient   06/02/2017 1603 06/06/2017 2222 Full Code [id number]  Demetrios Loll, MD Inpatient   05/24/2017 1831 05/25/2017 1921 Full Code [id number]  Saundra Shelling, MD ED   05/03/2017 1331 05/05/2017 1806 Full Code [id number]  Bettey Costa, MD Inpatient   04/01/2017 0947 04/03/2017 1156 Full Code [id number]  Loletha Grayer, MD ED   03/09/2017 0010 03/09/2017 1928 Full Code [id number]  Lance Coon, MD Inpatient   03/03/2017 2027 03/04/2017 1920 Full Code [id number]  Demetrios Loll, MD Inpatient   02/28/2017 2254 03/02/2017 1519 Full Code [id number]  Lance Coon, MD Inpatient   02/22/2017 1721 02/24/2017 1359 Full Code [id number]  Dustin Flock, MD Inpatient   01/22/2017 2337 01/25/2017 1522 Full Code [id number]  Gorden Harms, MD Inpatient   01/07/2017 1445 01/13/2017 1812 Full Code [id number]  Idelle Crouch, MD Inpatient   12/22/2016 1431 12/24/2016 1804 Full Code [id number]  Hillary Bow, MD ED   12/20/2016 2020 12/21/2016 1853 Full Code [id number]  Demetrios Loll, MD Inpatient   11/26/2016 1030 11/28/2016 1522 Full Code [id number]  Henreitta Leber, MD Inpatient   11/15/2016 1930 11/16/2016 1822  Full Code [id number]  Fritzi Mandes, MD Inpatient   10/29/2016 1453 10/30/2016 1506 Full Code [id number]  Loletha Grayer, MD ED   09/15/2016 1757 09/16/2016 1817 Full Code [id number]  Dustin Flock, MD ED   08/16/2016 0945 08/18/2016 1816 Full Code [id number]  Hillary Bow, MD ED   08/09/2016 1659 08/12/2016 2034 Full Code [id number]  Fritzi Mandes, MD Inpatient   07/03/2016 2154 07/05/2016 2122 Full Code [id number]  Demetrios Loll, MD Inpatient   06/24/2016 0423 06/26/2016 1546 Full Code [id number]  Saundra Shelling, MD Inpatient   06/14/2016 1453 06/19/2016 2104 Full Code [id number]  Hillary Bow, MD ED   05/31/2016 0642 06/01/2016 2130 Full Code [id number]  Saundra Shelling, MD Inpatient   05/05/2016 2309 05/10/2016 2157 Full Code [id number]  Vianne Bulls, MD Inpatient   04/16/2016 0356 04/17/2016 1827 Full Code [id number]  Saundra Shelling, MD Inpatient   01/19/2016 1513 01/22/2016 1414 Full Code [id number]  Loletha Grayer, MD ED   09/24/2015 1033 09/25/2015 1331 Full Code [id number]  Loletha Grayer, MD ED   05/16/2015 1417 05/17/2015 2057 Full Code [id number]  Idelle Crouch, MD Inpatient   04/30/2015 2008 05/08/2015 1940 Full Code [id number]  Vaughan Basta, MD Inpatient   04/06/2015 0507 04/07/2015 1736 Full Code  [id number]  Lance Coon, MD Inpatient   03/23/2015 1107 03/27/2015 1246 Full Code [id number]  Bettey Costa, MD Inpatient   06/19/2013 1132 06/21/2013 1749 Full Code [id number]  Cristal Ford, DO Inpatient   06/16/2013 1726 06/19/2013 1132 Full Code [id number]  Margarita Mail, PA-C ED   06/05/2013 1533 06/06/2013 1856 Full Code [id number]  Theodoro Kos, DO Inpatient   06/02/2013 0901 06/05/2013 1533 Full Code [id number]  Velvet Bathe, MD Inpatient   06/02/2013 0840 06/02/2013 0901 Full Code [id number]  Velvet Bathe, MD Inpatient       TOTAL TIME TAKING CARE OF THIS PATIENT: 54 minutes.    Saundra Shelling M.D on 11/18/2017 at 6:33 PM  Between 7am to 6pm - Pager - [phone]  After 6pm go to [email or web address] - password [email or web address]  [place] [facility]  Office  [phone]  CC: Primary care physician; [name], MD

## 2017-11-18 NOTE — ED Notes (Addendum)
Date and time results received: 11/18/17 1806  Test: Potassium Critical Value: 6.8 mmol/L  Name of Provider Notified: Dr. Burlene Arnt

## 2017-11-18 NOTE — ED Notes (Signed)
Pt states that his abd feels distended and that he has been having to have it tapped to remove fluid from it, states that he didn't go to dialysis this am because his left foot was hurting so bad and has been hurting for the past couple days. Skin is warm to touch, bilat lower extremities, unable to palpate pedal pulse, however doppler used and pedal pulse found to bilat feet by Dr Burlene Arnt. No distress noted at this time, warm blanket and remote given to pt upon request

## 2017-11-18 NOTE — Progress Notes (Signed)
[name], [place] 11/18/17  Subjective:   Patient known to Stanley [name] from previous admissions and outpatient dialysis.  He presents for subacute onset of left foot pain.  He also reports pain with walking.  Feels like his foot is numb.  Left foot toes are cooler than right.  Labs from the emergency room shows patient has severe hyperkalemia of 6.8.  He did not go to his dialysis because of foot pain.  Objective:  Vital signs in last 24 hours:  Temp:  [98.2 F (36.8 C)] 98.2 F (36.8 C) (09/07 1656) Pulse Rate:  [79-83] 79 (09/07 1836) Resp:  [17-20] 19 (09/07 1836) BP: (117-133)/(74-88) 133/88 (09/07 1836) SpO2:  [96 %] 96 % (09/07 1836) Weight:  [81.6 kg] 81.6 kg (09/07 1658)  Weight change:  Filed Weights   11/18/17 1658  Weight: 81.6 kg    Intake/Output:   No intake or output data in the 24 hours ending 11/18/17 1855   Physical Exam: General:  No acute distress, laying in the bed  HEENT  anicteric, moist oral mucous membranes  Neck  supple  Pulm/lungs  bilateral crackles, room air  CVS/Heart  regular, no rub  Abdomen:   Distended, ascites  Extremities:  2+ lower extremity edema, left foot toes cool to touch  Neurologic:  Alert, oriented  Skin:  No acute rashes  Access:  Right IJ PermCath       Basic Metabolic Panel:  Recent Labs  Lab 11/12/17 0614 11/18/17 1705  NA 136 139  K 5.3* 6.8*  CL 97* 106  CO2 25 23  GLUCOSE 138* 128*  BUN 41* 56*  CREATININE 7.48* 10.69*  CALCIUM 8.7* 8.5*     CBC: Recent Labs  Lab 11/18/17 1705  WBC 7.8  NEUTROABS 6.1  HGB 8.5*  HCT 27.9*  MCV 76.6*  PLT 308      Lab Results  Component Value Date   HEPBSAG Negative 06/16/2017   HEPBSAB Reactive 03/01/2017   HEPBIGM Negative 06/01/2016      Microbiology:  No results found for this or any previous visit (from the past 240 hour(s)).  Coagulation Studies: No results for input(s): LABPROT, INR in the last 72  hours.  Urinalysis: No results for input(s): COLORURINE, LABSPEC, PHURINE, GLUCOSEU, HGBUR, BILIRUBINUR, KETONESUR, PROTEINUR, UROBILINOGEN, NITRITE, LEUKOCYTESUR in the last 72 hours.  Invalid input(s): APPERANCEUR    Imaging: Dg Chest 2 View  Result Date: 11/18/2017 CLINICAL DATA:  Leg swelling EXAM: CHEST - 2 VIEW COMPARISON:  11/10/2017 chest radiograph. FINDINGS: Right internal jugular central venous catheter terminates in the lower third of the SVC. Intact sternotomy wires. CABG clips overlie the mediastinum. Stable cardiomediastinal silhouette with mild cardiomegaly. No pneumothorax. No pleural effusion. No overt pulmonary edema. No acute consolidative airspace disease. Mild bibasilar scarring versus atelectasis. IMPRESSION: 1. Stable cardiomegaly without overt pulmonary edema. 2. Mild bibasilar scarring versus atelectasis. Electronically Signed   By: Ilona Sorrel M.D.   On: 11/18/2017 18:25   Dg Foot Complete Left  Result Date: 11/18/2017 CLINICAL DATA:  Thickening and numbness of left foot. EXAM: LEFT FOOT - COMPLETE 3+ VIEW COMPARISON:  10/29/2016 FINDINGS: There is no evidence of fracture or dislocation. There is no evidence of arthropathy or other focal bone abnormality. Vascular calcifications noted. IMPRESSION: No acute fracture or dislocation identified about the left foot. Electronically Signed   By: Fidela Salisbury M.D.   On: 11/18/2017 18:13     Medications:   . calcium gluconate 1 GM IVPB  1 g (11/18/17 1842)   . [START ON 11/19/2017] Chlorhexidine Gluconate Cloth  6 each Topical Q0600  . patiromer  8.4 g Oral Daily     Assessment/ Plan:  57 y.o.African American male withend stage renal disease on hemodialysis secondary to Alport's syndrome, ascites, hypertension, anemia of chronic kidney disease, coronary artery disease, peripheral vascular disease, hyperlipidemia, gastrointestinal AVMs, pulmonary hypertension  [facility]   1.  End stage renal  disease with hyperkalemia: 2.  Ascites, Pulmonary edema and lower extremity edema 3.  Left foot pain, differential includes ischemic pain 4.  Anemia of chronic kidney disease 5.  Secondary hyperparathyroidism  Plan: Urgent hemodialysis for hyperkalemia Ultrafiltration goal 3.5-4 kg Low K bath Continue home dose of Ca Acetate Continue EPO with HD   LOS: 0 Stanley [name] 9/7/20196:55 PM  [name], [name]  Note: This note was prepared with Dragon dictation. Any transcription errors are unintentional

## 2017-11-18 NOTE — ED Provider Notes (Signed)
[facility] Emergency Department Provider Note  ____________________________________________   I have reviewed the triage vital signs and the nursing notes. Where available I have reviewed prior notes and, if possible and indicated, outside hospital notes.    HISTORY  Chief Complaint Leg Swelling and Bloated    HPI Stanley [name] is a 57 y.o. male known to his history of CHF cirrhosis coronary disease drug abuse PAD, chronic lower extremity pain on Neurontin states that his left lower and he did not go to dialysis this morning as a result.  He states is been getting worse over the last couple days.  His baseline gabapentin is not helping him with his chronic lower extremity pain.  He states he has had no injury or fall.  Patient states that he feels slightly short of breath as he does every time he misses dialysis.    Past Medical History:  Diagnosis Date  . Alcohol abuse   . CHF (congestive heart failure) ([place])   . Cirrhosis ([place])   . Coronary artery disease 2009  . Drug abuse ([place])   . End stage renal disease on dialysis [facility]) NEPHROLOGIST-   DR [facility]  IN [place]   HEMODIALYSIS --   TUES/  THURS/  SAT  . Gastrointestinal bleed 06/13/2017   From chart...hx of multiple GI bleeds  . GERD (gastroesophageal reflux disease)   . Hyperlipidemia   . Hypertension   . PAD (peripheral artery disease) ([place])   . Renal insufficiency    Per pt, 32 oz fluid restriction per day  . S/P triple vessel bypass 06/09/2016   2009ish  . Suicidal ideation    & HOMICIDAL IDEATION --  06-16-2013   ADMITTED TO BEHAVIOR HEALTH    Patient Active Problem List   Diagnosis Date Noted  . Acute respiratory failure ([place]) 10/29/2017  . ESRD (end stage renal disease) on dialysis ([place]) 07/28/2017  . Protein-calorie malnutrition, severe 06/14/2017  . Encounter for dialysis [facility])   . Palliative care by specialist   . Goals of care, counseling/discussion   . Malnutrition of  moderate degree 06/05/2017  . Secondary esophageal varices without bleeding ([place])   . Stomach irritation   . Idiopathic esophageal varices without bleeding ([place])   . Alcoholic hepatitis with ascites 05/24/2017  . ESRD (end stage renal disease) ([place]) 04/28/2017  . Uremia 03/08/2017  . ESRD on hemodialysis ([place]) 03/03/2017  . Weakness 02/28/2017  . Hypocalcemia 02/22/2017  . Shortness of breath 11/26/2016  . COPD (chronic obstructive pulmonary disease) ([place]) 10/30/2016  . COPD exacerbation ([place]) 10/29/2016  . Anemia   . Heme positive stool   . Ulceration of intestine   . Benign neoplasm of transverse colon   . Acute gastrointestinal hemorrhage   . Esophageal candidiasis ([place])   . Angiodysplasia of intestinal tract   . Acute respiratory failure with hypoxia ([place]) 07/03/2016  . GI bleeding 06/24/2016  . Rectal bleeding 06/14/2016  . Anemia of chronic disease 06/01/2016  . MRSA carrier 06/01/2016  . Chronic renal failure 05/23/2016  . Ischemic heart disease 05/23/2016  . Angiodysplasia of small intestine   . Melena   . Small bowel bleed not requiring more than 4 units of blood in 24 hours, ICU, or surgery   . Anemia due to chronic blood loss   . Abdominal pain 05/05/2016  . Acute posthemorrhagic anemia 04/17/2016  . Gastrointestinal bleed 04/17/2016  . History of esophagogastroduodenoscopy (EGD) 04/17/2016  . Elevated troponin 04/17/2016  . Alcohol abuse 04/17/2016  .  Upper GI bleed 01/19/2016  . Blood in stool   . Angiodysplasia of stomach and duodenum with hemorrhage   . Gastritis   . Reflux esophagitis   . GI bleed 05/16/2015  . Acute GI bleeding   . Symptomatic anemia 04/30/2015  . HTN (hypertension) 04/06/2015  . GERD (gastroesophageal reflux disease) 04/06/2015  . HLD (hyperlipidemia) 04/06/2015  . Dyspnea 04/06/2015  . Cirrhosis of liver with ascites ([place]) 04/06/2015  . Ascites 04/06/2015  . GIB (gastrointestinal bleeding) 03/23/2015  . Homicidal ideation  06/19/2013  . Suicidal intent 06/19/2013  . Homicidal ideations 06/19/2013  . Hyperkalemia 06/16/2013  . Mandible fracture ([place]) 06/05/2013  . Fracture, mandible ([place]) 06/02/2013  . Coronary atherosclerosis of native coronary artery 06/02/2013  . ESRD on dialysis ([place]) 06/02/2013  . Mandible open fracture ([place]) 06/02/2013    Past Surgical History:  Procedure Laterality Date  . A/V FISTULAGRAM Right 06/06/2017   Procedure: A/V FISTULAGRAM;  Surgeon: Katha Cabal, MD;  Location: [place] [facility];  Service: Cardiovascular;  Laterality: Right;  . A/V SHUNT INTERVENTION N/A 06/06/2017   Procedure: A/V SHUNT INTERVENTION;  Surgeon: Katha Cabal, MD;  Location: [place] [facility];  Service: Cardiovascular;  Laterality: N/A;  . AGILE CAPSULE N/A 06/19/2016   Procedure: AGILE CAPSULE;  Surgeon: [name], MD;  Location: [facility] ENDOSCOPY;  Service: Endoscopy;  Laterality: N/A;  . COLONOSCOPY WITH PROPOFOL N/A 06/18/2016   Procedure: COLONOSCOPY WITH PROPOFOL;  Surgeon: [name], MD;  Location: [facility] ENDOSCOPY;  Service: Endoscopy;  Laterality: N/A;  . COLONOSCOPY WITH PROPOFOL N/A 08/12/2016   Procedure: COLONOSCOPY WITH PROPOFOL;  Surgeon: [name], MD;  Location: [facility] ENDOSCOPY;  Service: Endoscopy;  Laterality: N/A;  . COLONOSCOPY WITH PROPOFOL N/A 05/05/2017   Procedure: COLONOSCOPY WITH PROPOFOL;  Surgeon: Manya Silvas, MD;  Location: [facility] ENDOSCOPY;  Service: Endoscopy;  Laterality: N/A;  . CORONARY ANGIOPLASTY  ?   PT UNABLE TO TELL IF  BEFORE OR AFTER  CABG  . CORONARY ARTERY BYPASS GRAFT  2008  ([place])   3 VESSEL  . DIALYSIS FISTULA CREATION  LAST SURGERY  APPOX  2008  . ENTEROSCOPY N/A 05/10/2016   Procedure: ENTEROSCOPY;  Surgeon: Jerene Bears, MD;  Location: [place];  Service: Gastroenterology;  Laterality: N/A;  . ENTEROSCOPY N/A 08/12/2016   Procedure: ENTEROSCOPY;  Surgeon: [name], MD;  Location: [facility] ENDOSCOPY;  Service: Endoscopy;  Laterality:  N/A;  . ENTEROSCOPY Left 06/03/2017   Procedure: ENTEROSCOPY;  Surgeon: Virgel Manifold, MD;  Location: [facility] ENDOSCOPY;  Service: Endoscopy;  Laterality: Left;  Procedure date will ultimately depend on when patient is medically optimized before the procedure, pending hemodialysis and blood transfusions etc. Will place on schedule and change depending on clinical status.   . ENTEROSCOPY N/A 06/05/2017   Procedure: ENTEROSCOPY;  Surgeon: Virgel Manifold, MD;  Location: [facility] ENDOSCOPY;  Service: Endoscopy;  Laterality: N/A;  . ENTEROSCOPY N/A 06/15/2017   Procedure: Push ENTEROSCOPY;  Surgeon: [name], MD;  Location: [facility] ENDOSCOPY;  Service: Endoscopy;  Laterality: N/A;  . ESOPHAGOGASTRODUODENOSCOPY N/A 05/07/2015   Procedure: ESOPHAGOGASTRODUODENOSCOPY (EGD);  Surgeon: Hulen Luster, MD;  Location: [facility] ENDOSCOPY;  Service: Endoscopy;  Laterality: N/A;  . ESOPHAGOGASTRODUODENOSCOPY (EGD) WITH PROPOFOL N/A 05/17/2015   Procedure: ESOPHAGOGASTRODUODENOSCOPY (EGD) WITH PROPOFOL;  Surgeon: [name], MD;  Location: [facility] ENDOSCOPY;  Service: Endoscopy;  Laterality: N/A;  . ESOPHAGOGASTRODUODENOSCOPY (EGD) WITH PROPOFOL N/A 01/20/2016   Procedure: ESOPHAGOGASTRODUODENOSCOPY (EGD) WITH PROPOFOL;  Surgeon: [name],  MD;  Location: [facility] ENDOSCOPY;  Service: Endoscopy;  Laterality: N/A;  . ESOPHAGOGASTRODUODENOSCOPY (EGD) WITH PROPOFOL N/A 04/17/2016   Procedure: ESOPHAGOGASTRODUODENOSCOPY (EGD) WITH PROPOFOL;  Surgeon: Lin Landsman, MD;  Location: [facility] ENDOSCOPY;  Service: Gastroenterology;  Laterality: N/A;  . ESOPHAGOGASTRODUODENOSCOPY (EGD) WITH PROPOFOL  05/09/2016   Procedure: ESOPHAGOGASTRODUODENOSCOPY (EGD) WITH PROPOFOL;  Surgeon: Jerene Bears, MD;  Location: [place];  Service: Endoscopy;;  . ESOPHAGOGASTRODUODENOSCOPY (EGD) WITH PROPOFOL N/A 06/16/2016   Procedure: ESOPHAGOGASTRODUODENOSCOPY (EGD) WITH PROPOFOL;  Surgeon: [name], MD;  Location: [facility] ENDOSCOPY;  Service: Endoscopy;   Laterality: N/A;  . ESOPHAGOGASTRODUODENOSCOPY (EGD) WITH PROPOFOL N/A 05/05/2017   Procedure: ESOPHAGOGASTRODUODENOSCOPY (EGD) WITH PROPOFOL;  Surgeon: Manya Silvas, MD;  Location[facility];  Service: Endoscopy;  Laterality: N/A;  . ESOPHAGOGASTRODUODENOSCOPY (EGD) WITH PROPOFOL N/A 06/15/2017   Procedure: ESOPHAGOGASTRODUODENOSCOPY (EGD) WITH PROPOFOL;  Surgeon: [name], MD;  Location: [facility] ENDOSCOPY;  Service: Endoscopy;  Laterality: N/A;  . GIVENS CAPSULE STUDY N/A 05/07/2016   Procedure: GIVENS CAPSULE STUDY;  Surgeon: Doran Stabler, MD;  Location: [place];  Service: Endoscopy;  Laterality: N/A;  . MANDIBULAR HARDWARE REMOVAL N/A 07/29/2013   Procedure: REMOVAL OF ARCH BARS;  Surgeon: Theodoro Kos, DO;  Location: [place];  Service: Plastics;  Laterality: N/A;  . ORIF MANDIBULAR FRACTURE N/A 06/05/2013   Procedure: REPAIR OF MANDIBULAR FRACTURE x 2 with maxillo-mandibular fixation ;  Surgeon: Theodoro Kos, DO;  Location: [place];  Service: Plastics;  Laterality: N/A;  . PARACENTESIS    . PERIPHERAL ARTERIAL STENT GRAFT Left     Prior to Admission medications   Medication Sig Start Date End Date Taking? Authorizing Provider  albuterol (PROVENTIL HFA;VENTOLIN HFA) 108 (90 Base) MCG/ACT inhaler Inhale 2 puffs into the lungs every 6 (six) hours as needed for wheezing or shortness of breath. 04/21/17   Lisa Roca, MD  B Complex-C-Zn-Folic Acid (DIALYVITE/ZINC) TABS Take 1 tablet by mouth daily. 09/08/17   [provider]  budesonide-formoterol (SYMBICORT) 160-4.5 MCG/ACT inhaler Inhale 2 puffs into the lungs daily. 11/16/16   Vaughan Basta, MD  calcium acetate (PHOSLO) 667 MG capsule Take 2,001 mg by mouth 3 (three) times daily.    [provider]  ferrous sulfate 325 (65 FE) MG tablet Take 1 tablet by mouth 3 (three) times daily. 01/21/17   [provider]  folic acid (FOLVITE) 1 MG tablet Take 1 tablet (1 mg total) by  mouth daily. 10/31/17   Fritzi Mandes, MD  gabapentin (NEURONTIN) 300 MG capsule Take 300 mg by mouth 2 (two) times daily.  08/16/16   [provider]  ipratropium-albuterol (DUONEB) 0.5-2.5 (3) MG/3ML SOLN Take 3 mLs by nebulization every 6 (six) hours as needed. 11/28/16   Loletha Grayer, MD  labetalol (NORMODYNE) 100 MG tablet Take 100 mg by mouth daily. 03/24/17   [provider]  multivitamin (RENA-VIT) TABS tablet Take 1 tablet by mouth at bedtime. 10/31/17   Fritzi Mandes, MD  nitroGLYCERIN (NITROSTAT) 0.4 MG SL tablet Place 1 tablet (0.4 mg total) under the tongue every 5 (five) minutes as needed. 04/12/16   Wende Bushy, MD  omeprazole (PRILOSEC) 40 MG capsule Take 40 mg by mouth daily. 09/08/17   [provider]  Spacer/Aero Chamber Mouthpiece MISC 1 Units by Does not apply route every 4 (four) hours as needed (wheezing). 02/19/17   Darel Hong, MD  tiotropium (SPIRIVA HANDIHALER) 18 MCG inhalation capsule Place 1 capsule (18 mcg total) into inhaler and inhale daily.  11/16/16 11/16/17  Vaughan Basta, MD    Allergies Patient has no known allergies.  Family History  Problem Relation Age of Onset  . Colon cancer Mother   . Cancer Father   . Cancer Sister   . Kidney disease Brother     Social History Social History   Tobacco Use  . Smoking status: Current Every Day Smoker    Packs/day: 0.15    Years: 40.00    Pack years: 6.00    Types: Cigarettes  . Smokeless tobacco: Never Used  Substance Use Topics  . Alcohol use: No    Comment: pt reports quitting after learning about cirrhosis  . Drug use: No    Frequency: 7.0 times per week    Types: Marijuana, Cocaine    Review of Systems Constitutional: No fever/chills Eyes: No visual changes. ENT: No sore throat. No stiff neck no neck pain Cardiovascular: Denies chest pain. Respiratory: Denies shortness of breath. Gastrointestinal:   no vomiting.  No diarrhea.  No constipation. Genitourinary:  Negative for dysuria. Musculoskeletal: Negative lower extremity swelling Skin: Negative for rash. Neurological: Negative for severe headaches, focal weakness or numbness.   ____________________________________________   PHYSICAL EXAM:  VITAL SIGNS: ED Triage Vitals  Enc Vitals Group     BP 11/18/17 1656 120/78     Pulse Rate 11/18/17 1656 83     Resp 11/18/17 1656 20     Temp 11/18/17 1656 98.2 F (36.8 C)     Temp Source 11/18/17 1656 Oral     SpO2 11/18/17 1656 96 %     Weight 11/18/17 1658 180 lb (81.6 kg)     Height 11/18/17 1658 6\' 3"  (1.905 m)     Head Circumference --      Peak Flow --      Pain Score 11/18/17 1657 9     Pain Loc --      Pain Edu? --      Excl. in [place]? --     Constitutional: Alert and oriented. Well appearing and in no acute distress. Eyes: Conjunctivae are normal Head: Atraumatic HEENT: No congestion/rhinnorhea. Mucous membranes are moist.  Oropharynx non-erythematous Neck:   Nontender with no meningismus, no masses, no stridor Cardiovascular: Normal rate, regular rhythm. Grossly normal heart sounds.  Good peripheral circulation. Respiratory: Normal respiratory effort.  No retractions. Lungs CTAB. Abdominal: Soft and nontender. No distention. No guarding no rebound Back:  There is no focal tenderness or step off.  there is no midline tenderness there are no lesions noted. there is no CVA tenderness  Musculoskeletal: , no upper extremity tenderness. No joint effusions, she was repeatable tenderness diffusely to the left foot area, no significant swelling, ultrasounds bilaterally show good Doppler DP and posterior tibial pulses which are symmetric.  There is no erythema or lesions noted.  No traumatic injury noted.  No evidence of DVT on exam.  Neurologic:  Normal speech and language.  Patient with no evidence of acute CVA however he does not want to lift his left leg because he states that hurts this does limit our ability to fully evaluate. Skin:   Skin is warm, dry and intact. No rash noted. Psychiatric: Mood and affect are normal. Speech and behavior are normal.  ____________________________________________   LABS (all labs ordered are listed, but only abnormal results are displayed)  Labs Reviewed  BASIC METABOLIC PANEL - Abnormal; Notable for the following components:      Result Value   Potassium 6.8 (*)  Glucose, Bld 128 (*)    BUN 56 (*)    Creatinine, Ser 10.69 (*)    Calcium 8.5 (*)    GFR calc non Af Amer 5 (*)    GFR calc Af Amer 5 (*)    All other components within normal limits  CBC WITH DIFFERENTIAL/PLATELET - Abnormal; Notable for the following components:   RBC 3.65 (*)    Hemoglobin 8.5 (*)    HCT 27.9 (*)    MCV 76.6 (*)    MCH 23.4 (*)    MCHC 30.5 (*)    RDW 22.5 (*)    Lymphs Abs 0.7 (*)    All other components within normal limits  ETHANOL  LACTIC ACID, PLASMA  LACTIC ACID, PLASMA    Pertinent labs  results that were available during my care of the patient were reviewed by me and considered in my medical decision making (see chart for details). ____________________________________________  EKG  I personally interpreted any EKGs ordered by me or triage Sinus rhythm, rate 82 bpm no acute ST elevation or depression diffuse ST changes noted, no peaked T waves noted ____________________________________________  RADIOLOGY  Pertinent labs & imaging results that were available during my care of the patient were reviewed by me and considered in my medical decision making (see chart for details). If possible, patient and/or family made aware of any abnormal findings.  Dg Foot Complete Left  Result Date: 11/18/2017 CLINICAL DATA:  Thickening and numbness of left foot. EXAM: LEFT FOOT - COMPLETE 3+ VIEW COMPARISON:  10/29/2016 FINDINGS: There is no evidence of fracture or dislocation. There is no evidence of arthropathy or other focal bone abnormality. Vascular calcifications noted. IMPRESSION: No  acute fracture or dislocation identified about the left foot. Electronically Signed   By: Fidela Salisbury M.D.   On: 11/18/2017 18:13   ____________________________________________    PROCEDURES  Procedure(s) performed: None  Procedures  Critical Care performed: CRITICAL CARE Performed by: Schuyler Amor   Total critical care time: 45 minutes  Critical care time was exclusive of separately billable procedures and treating other patients.  Critical care was necessary to treat or prevent imminent or life-threatening deterioration.  Critical care was time spent personally by me on the following activities: development of treatment plan with patient and/or surrogate as well as nursing, discussions with consultants, evaluation of patient's response to treatment, examination of patient, obtaining history from patient or surrogate, ordering and performing treatments and interventions, ordering and review of laboratory studies, ordering and review of radiographic studies, pulse oximetry and re-evaluation of patient's condition.   ____________________________________________   INITIAL IMPRESSION / ASSESSMENT AND PLAN / ED COURSE  Pertinent labs & imaging results that were available during my care of the patient were reviewed by me and considered in my medical decision making (see chart for details).  Here with chronic leg pain usually managed with gabapentin, he has reassuring work-up thus far.  We do notice however the patient has an elevated potassium of 6.8.  I was able to discuss with Dr. [name].  Patient does have chronically elevated potassium but not to this extent.  EKG does not show any peaked T waves, however, he will require emergent dialysis.  There are administering the normal medications for this and we will admit him.  Hospitalist service aware    ____________________________________________   FINAL CLINICAL IMPRESSION(S) / ED DIAGNOSES  Final diagnoses:  None       This chart was dictated using voice recognition software.  Despite best efforts to proofread,  errors can occur which can change meaning.      Schuyler Amor, MD 11/18/17 [phone]

## 2017-11-18 NOTE — Progress Notes (Signed)
This note also relates to the following rows which could not be included: Pulse Rate - Cannot attach notes to unvalidated device data Resp - Cannot attach notes to unvalidated device data SpO2 - Cannot attach notes to unvalidated device data  Hd started  

## 2017-11-19 LAB — BASIC METABOLIC PANEL
Anion gap: 9 (ref 5–15)
BUN: 29 mg/dL — AB (ref 6–20)
CHLORIDE: 101 mmol/L (ref 98–111)
CO2: 27 mmol/L (ref 22–32)
Calcium: 7.9 mg/dL — ABNORMAL LOW (ref 8.9–10.3)
Creatinine, Ser: 6.56 mg/dL — ABNORMAL HIGH (ref 0.61–1.24)
GFR calc Af Amer: 10 mL/min — ABNORMAL LOW (ref >60)
GFR calc non Af Amer: 8 mL/min — ABNORMAL LOW (ref >60)
GLUCOSE: 123 mg/dL — AB (ref 70–99)
Potassium: 4.5 mmol/L (ref 3.5–5.1)
Sodium: 137 mmol/L (ref 135–145)

## 2017-11-19 LAB — MRSA PCR SCREENING: MRSA BY PCR: NEGATIVE

## 2017-11-19 NOTE — Progress Notes (Signed)
[facility] NAME: Stanley [name]    MR#:  [id number]  DATE OF BIRTH:  September 25, 1960  SUBJECTIVE:  CHIEF COMPLAINT:   Chief Complaint  Patient presents with  . Leg Swelling  . Bloated   Continues to have left foot pain.  No shortness of breath.  REVIEW OF SYSTEMS:    Review of Systems  Constitutional: Positive for malaise/fatigue. Negative for chills and fever.  HENT: Negative for sore throat.   Eyes: Negative for blurred vision, double vision and pain.  Respiratory: Positive for cough. Negative for hemoptysis, shortness of breath and wheezing.   Cardiovascular: Negative for chest pain, palpitations, orthopnea and leg swelling.  Gastrointestinal: Negative for abdominal pain, constipation, diarrhea, heartburn, nausea and vomiting.  Genitourinary: Negative for dysuria and hematuria.  Musculoskeletal: Positive for joint pain. Negative for back pain.  Skin: Negative for rash.  Neurological: Negative for sensory change, speech change, focal weakness and headaches.  Endo/Heme/Allergies: Does not bruise/bleed easily.  Psychiatric/Behavioral: Negative for depression. The patient is not nervous/anxious.     DRUG ALLERGIES:  No Known Allergies  VITALS:  Blood pressure 131/66, pulse 82, temperature 98.1 F (36.7 C), temperature source Oral, resp. rate 18, height 6\' 3"  (1.905 m), weight 84 kg, SpO2 95 %.  PHYSICAL EXAMINATION:   Physical Exam  GENERAL:  57 y.o.-year-old patient lying in the bed with no acute distress.  EYES: Pupils equal, round, reactive to light and accommodation. No scleral icterus. Extraocular muscles intact.  HEENT: Head atraumatic, normocephalic. Oropharynx and nasopharynx clear.  NECK:  Supple, no jugular venous distention. No thyroid enlargement, no tenderness.  LUNGS: Normal breath sounds bilaterally, no wheezing, rales, rhonchi. No use of accessory muscles of respiration.  CARDIOVASCULAR: S1, S2 normal. No  murmurs, rubs, or gallops.  ABDOMEN: Soft, nontender, distended. Bowel sounds present. No organomegaly or mass.  EXTREMITIES: Lateral lower committee edema NEUROLOGIC: Cranial nerves II through XII are intact. No focal Motor or sensory deficits b/l.   PSYCHIATRIC: The patient is alert and oriented x 3.  Anxious SKIN: No obvious rash, lesion, or ulcer.   LABORATORY PANEL:   CBC Recent Labs  Lab 11/18/17 1705  WBC 7.8  HGB 8.5*  HCT 27.9*  PLT 308   ------------------------------------------------------------------------------------------------------------------ Chemistries  Recent Labs  Lab 11/19/17 0440  NA 137  K 4.5  CL 101  CO2 27  GLUCOSE 123*  BUN 29*  CREATININE 6.56*  CALCIUM 7.9*   ------------------------------------------------------------------------------------------------------------------  Cardiac Enzymes No results for input(s): TROPONINI in the last 168 hours. ------------------------------------------------------------------------------------------------------------------  RADIOLOGY:  Dg Chest 2 View  Result Date: 11/18/2017 CLINICAL DATA:  Leg swelling EXAM: CHEST - 2 VIEW COMPARISON:  11/10/2017 chest radiograph. FINDINGS: Right internal jugular central venous catheter terminates in the lower third of the SVC. Intact sternotomy wires. CABG clips overlie the mediastinum. Stable cardiomediastinal silhouette with mild cardiomegaly. No pneumothorax. No pleural effusion. No overt pulmonary edema. No acute consolidative airspace disease. Mild bibasilar scarring versus atelectasis. IMPRESSION: 1. Stable cardiomegaly without overt pulmonary edema. 2. Mild bibasilar scarring versus atelectasis. Electronically Signed   By: Ilona Sorrel M.D.   On: 11/18/2017 18:25   US Venous Img Lower Unilateral Left  Result Date: 11/18/2017 CLINICAL DATA:  57 year old male with LEFT LOWER extremity pain and swelling. EXAM: LEFT LOWER EXTREMITY VENOUS DOPPLER ULTRASOUND TECHNIQUE:  Gray-scale sonography with graded compression, as well as color Doppler and duplex ultrasound were performed to evaluate the lower extremity deep venous systems from the  level of the common femoral vein and including the common femoral, femoral, profunda femoral, popliteal and calf veins including the posterior tibial, peroneal and gastrocnemius veins when visible. The superficial great saphenous vein was also interrogated. Spectral Doppler was utilized to evaluate flow at rest and with distal augmentation maneuvers in the common femoral, femoral and popliteal veins. COMPARISON:  None. FINDINGS: Normal flow, compressibility, and augmentation within the distal common femoral, proximal profunda femoral, proximal greater saphenous, entire femoral, popliteal veins, and imaged calf veins. IMPRESSION: No evidence of LEFT LOWER extremity deep venous thrombosis. Electronically Signed   By: Margarette Canada M.D.   On: 11/18/2017 19:05   Dg Foot Complete Left  Result Date: 11/18/2017 CLINICAL DATA:  Thickening and numbness of left foot. EXAM: LEFT FOOT - COMPLETE 3+ VIEW COMPARISON:  10/29/2016 FINDINGS: There is no evidence of fracture or dislocation. There is no evidence of arthropathy or other focal bone abnormality. Vascular calcifications noted. IMPRESSION: No acute fracture or dislocation identified about the left foot. Electronically Signed   By: Fidela Salisbury M.D.   On: 11/18/2017 18:13     ASSESSMENT AND PLAN:   57 year old male patient with history of chronic diastolic heart failure, end-stage renal disease on dialysis, COPD, tobacco abuse, cirrhosis of liver, coronary artery disease presented to the emergency room for edema in the lower extremities and abdominal bloating and distention  *End-stage renal disease with noncompliance.  Significant fluid overload.  Associated with hyperkalemia.  Patient had hemodialysis yesterday.  Will need 1 more session of inpatient hemodialysis.  Discussed with  nephrology Dr. [name].  Hyperkalemia has resolved.  *Cirrhosis of liver with ascites.  Patient gets paracentesis as outpatient.  Recent paracentesis with 6 L removed.  Does not need paracentesis at this time.   -Tobacco abuse Counseled to quit on admission  -Chronic diastolic heart failure Medical management to continue Fluid management as per dialysis  -DVT prophylaxis with subcu heparin daily  * Chronic left foot pain Pain medications as needed  All the records are reviewed and case discussed with Care Management/Social Worker Management plans discussed with the patient, family and they are in agreement.  CODE STATUS: FULL CODE  DVT Prophylaxis: SCDs  TOTAL TIME TAKING CARE OF THIS PATIENT: 35 minutes.   POSSIBLE D/C IN 1-2 DAYS, DEPENDING ON CLINICAL CONDITION.  Neita Carp M.D on 11/19/2017 at 10:49 AM  Between 7am to 6pm - Pager - [phone]  After 6pm go to [email or web address] - password [email or web address]  Office  [phone]  CC: Primary care physician; [name], MD  Note: This dictation was prepared with Dragon dictation along with smaller phrase technology. Any transcriptional errors that result from this process are unintentional.

## 2017-11-19 NOTE — Progress Notes (Signed)
This note also relates to the following rows which could not be included: Pulse Rate - Cannot attach notes to unvalidated device data Resp - Cannot attach notes to unvalidated device data BP - Cannot attach notes to unvalidated device data  Hd copleted  

## 2017-11-19 NOTE — Plan of Care (Signed)
  Problem: Education: Goal: Knowledge of General Education information will improve Description Including pain rating scale, medication(s)/side effects and non-pharmacologic comfort measures Outcome: Progressing   Problem: Clinical Measurements: Goal: Ability to maintain clinical measurements within normal limits will improve Outcome: Progressing Goal: Will remain free from infection Outcome: Progressing Goal: Diagnostic test results will improve Outcome: Progressing Goal: Respiratory complications will improve Outcome: Progressing Goal: Cardiovascular complication will be avoided Outcome: Progressing   Problem: Pain Managment: Goal: General experience of comfort will improve Outcome: Progressing   Problem: Safety: Goal: Ability to remain free from injury will improve Outcome: Progressing   Problem: Skin Integrity: Goal: Risk for impaired skin integrity will decrease Outcome: Progressing   Problem: Clinical Measurements: Goal: Complications related to the disease process or treatment will be avoided or minimized Outcome: Progressing Goal: Dialysis access will remain free of complications Outcome: Progressing   Problem: Fluid Volume: Goal: Fluid volume balance will be maintained or improved Outcome: Progressing   Problem: Respiratory: Goal: Respiratory symptoms related to disease process will be avoided Outcome: Progressing   Problem: Urinary Elimination: Goal: Progression of disease will be identified and treated Outcome: Progressing

## 2017-11-19 NOTE — Progress Notes (Signed)
[facility], [place] 11/19/17  Subjective:   Patient known to [name] from previous admissions and outpatient dialysis.  He presents for subacute onset of left foot pain.  He also reports pain with walking.  Feels like his foot is numb.  Admitted for severe hyperkalemia and underwent emergent HD Potassium is normal now 3 L removed with HD  Objective:  Vital signs in last 24 hours:  Temp:  [98 F (36.7 C)-99.4 F (37.4 C)] 98.1 F (36.7 C) (09/08 0850) Pulse Rate:  [71-90] 82 (09/08 0850) Resp:  [11-20] 18 (09/08 0850) BP: (116-143)/(60-90) 131/66 (09/08 0850) SpO2:  [95 %-99 %] 95 % (09/08 0850) Weight:  [81.6 kg-88.6 kg] 84 kg (09/08 0346)  Weight change:  Filed Weights   11/18/17 2130 11/19/17 0101 11/19/17 0346  Weight: 88.6 kg 84.5 kg 84 kg    Intake/Output:    Intake/Output Summary (Last 24 hours) at 11/19/2017 0935 Last data filed at 11/19/2017 6294 Gross per 24 hour  Intake 603 ml  Output 3000 ml  Net -2397 ml     Physical Exam: General:  No acute distress, laying in the bed  HEENT  anicteric, moist oral mucous membranes  Neck  supple  Pulm/lungs  bilateral crackles, room air  CVS/Heart  regular, no rub  Abdomen:   Distended, ascites  Extremities:  2+ lower extremity edema,   Neurologic:  Alert, oriented  Skin:  No acute rashes  Access:  Right IJ PermCath       Basic Metabolic Panel:  Recent Labs  Lab 11/18/17 1705 11/18/17 2146 11/19/17 0440  NA 139  --  137  K 6.8* 5.5* 4.5  CL 106  --  101  CO2 23  --  27  GLUCOSE 128*  --  123*  BUN 56*  --  29*  CREATININE 10.69*  --  6.56*  CALCIUM 8.5*  --  7.9*  PHOS  --  4.6  --      CBC: Recent Labs  Lab 11/18/17 1705  WBC 7.8  NEUTROABS 6.1  HGB 8.5*  HCT 27.9*  MCV 76.6*  PLT 308      Lab Results  Component Value Date   HEPBSAG Negative 06/16/2017   HEPBSAB Reactive 03/01/2017   HEPBIGM Negative 06/01/2016      Microbiology:  Recent Results (from the  past 240 hour(s))  MRSA PCR Screening     Status: None   Collection Time: 11/19/17  2:01 AM  Result Value Ref Range Status   MRSA by PCR NEGATIVE NEGATIVE Final    Comment:        The GeneXpert MRSA Assay (FDA approved for NASAL specimens only), is one component of a comprehensive MRSA colonization surveillance program. It is not intended to diagnose MRSA infection nor to guide or monitor treatment for MRSA infections. Performed at [facility], [address] [place], [place] [address]     Coagulation Studies: No results for input(s): LABPROT, INR in the last 72 hours.  Urinalysis: No results for input(s): COLORURINE, LABSPEC, PHURINE, GLUCOSEU, HGBUR, BILIRUBINUR, KETONESUR, PROTEINUR, UROBILINOGEN, NITRITE, LEUKOCYTESUR in the last 72 hours.  Invalid input(s): APPERANCEUR    Imaging: Dg Chest 2 View  Result Date: 11/18/2017 CLINICAL DATA:  Leg swelling EXAM: CHEST - 2 VIEW COMPARISON:  11/10/2017 chest radiograph. FINDINGS: Right internal jugular central venous catheter terminates in the lower third of the SVC. Intact sternotomy wires. CABG clips overlie the mediastinum. Stable cardiomediastinal silhouette with mild cardiomegaly. No pneumothorax.  No pleural effusion. No overt pulmonary edema. No acute consolidative airspace disease. Mild bibasilar scarring versus atelectasis. IMPRESSION: 1. Stable cardiomegaly without overt pulmonary edema. 2. Mild bibasilar scarring versus atelectasis. Electronically Signed   By: Ilona Sorrel M.D.   On: 11/18/2017 18:25   US Venous Img Lower Unilateral Left  Result Date: 11/18/2017 CLINICAL DATA:  57 year old male with LEFT LOWER extremity pain and swelling. EXAM: LEFT LOWER EXTREMITY VENOUS DOPPLER ULTRASOUND TECHNIQUE: Gray-scale sonography with graded compression, as well as color Doppler and duplex ultrasound were performed to evaluate the lower extremity deep venous systems from the level of the common femoral vein and including  the common femoral, femoral, profunda femoral, popliteal and calf veins including the posterior tibial, peroneal and gastrocnemius veins when visible. The superficial great saphenous vein was also interrogated. Spectral Doppler was utilized to evaluate flow at rest and with distal augmentation maneuvers in the common femoral, femoral and popliteal veins. COMPARISON:  None. FINDINGS: Normal flow, compressibility, and augmentation within the distal common femoral, proximal profunda femoral, proximal greater saphenous, entire femoral, popliteal veins, and imaged calf veins. IMPRESSION: No evidence of LEFT LOWER extremity deep venous thrombosis. Electronically Signed   By: Margarette Canada M.D.   On: 11/18/2017 19:05   Dg Foot Complete Left  Result Date: 11/18/2017 CLINICAL DATA:  Thickening and numbness of left foot. EXAM: LEFT FOOT - COMPLETE 3+ VIEW COMPARISON:  10/29/2016 FINDINGS: There is no evidence of fracture or dislocation. There is no evidence of arthropathy or other focal bone abnormality. Vascular calcifications noted. IMPRESSION: No acute fracture or dislocation identified about the left foot. Electronically Signed   By: Fidela Salisbury M.D.   On: 11/18/2017 18:13     Medications:   . sodium chloride     . calcium acetate  2,001 mg Oral TID WC  . Chlorhexidine Gluconate Cloth  6 each Topical Q0600  . [START ON 11/21/2017] epoetin (EPOGEN/PROCRIT) injection  4,000 Units Intravenous Q T,Th,Sa-HD  . ferrous sulfate  325 mg Oral TID  . folic acid  1 mg Oral Daily  . gabapentin  300 mg Oral BID  . heparin  5,000 Units Subcutaneous Q8H  . labetalol  100 mg Oral Daily  . mometasone-formoterol  2 puff Inhalation BID  . multivitamin  1 tablet Oral QHS  . pantoprazole  40 mg Oral Daily  . patiromer  8.4 g Oral Daily  . sodium chloride flush  3 mL Intravenous Q12H     Assessment/ Plan:  57 y.o.African American male withend stage renal disease on hemodialysis secondary to Alport's syndrome,  ascites, hypertension, anemia of chronic kidney disease, coronary artery disease, peripheral vascular disease, hyperlipidemia, gastrointestinal AVMs, pulmonary hypertension  [facility]   1.  End stage renal disease with hyperkalemia: 2.  Ascites, Pulmonary edema and lower extremity edema 3.  Left foot pain, 4.  Anemia of chronic kidney disease 5.  Secondary hyperparathyroidism  Plan: Potassium is corrected UF only treatment on Monday, then regular treatment starting Tuesday Ultrafiltration goal 4-5 kg as tolerated Continue home dose of Ca Acetate Continue EPO with HD Encouraged fluid restriction   LOS: [place] 9/8/20199:35 AM  [place], [place]  Note: This note was prepared with Dragon dictation. Any transcription errors are unintentional

## 2017-11-20 MED ORDER — TIOTROPIUM BROMIDE MONOHYDRATE 18 MCG IN CAPS
18.0000 ug | ORAL_CAPSULE | Freq: Every day | RESPIRATORY_TRACT | Status: DC
  Filled 2017-11-20: qty 5

## 2017-11-20 MED ORDER — OXYCODONE-ACETAMINOPHEN 5-325 MG PO TABS: 1.0000 | ORAL_TABLET | Freq: Four times a day (QID) | ORAL | Status: DC | PRN

## 2017-11-20 MED ORDER — DIPHENHYDRAMINE HCL 50 MG/ML IJ SOLN
25.0000 mg | Freq: Once | INTRAMUSCULAR | Status: AC
  Administered 2017-11-20: 25 mg via INTRAVENOUS

## 2017-11-20 NOTE — Progress Notes (Signed)
Pt back from HD, requesting to leave, MD paged.

## 2017-11-20 NOTE — Progress Notes (Signed)
HD Treatment Complete    11/20/17 1405  Vital Signs  Pulse Rate 72  Resp 11  Oxygen Therapy  SpO2 96 %  During Hemodialysis Assessment  Blood Flow Rate (mL/min) 400 mL/min  Arterial Pressure (mmHg) -170 mmHg  Venous Pressure (mmHg) 160 mmHg  Transmembrane Pressure (mmHg) 70 mmHg  Ultrafiltration Rate (mL/min) 1390 mL/min  Dialysate Flow Rate (mL/min) 800 ml/min  Conductivity: Machine  13.8  HD Safety Checks Performed Yes  Intra-Hemodialysis Comments Tolerated well;Tx completed (Per pt request to come off machine)  Education / Care Plan  Dialysis Education Provided Yes  Documented Education in Care Plan Yes

## 2017-11-20 NOTE — Consult Note (Signed)
Came to see patient for a consult concerning possible PAD and foot pain and numbness. Patient not in the room. Apparently he has left the hospital and been discharged.

## 2017-11-20 NOTE — Progress Notes (Signed)
Pre HD Assessment    11/20/17 0955  Neurological  Level of Consciousness Alert  Orientation Level Oriented X4  Respiratory  Respiratory Pattern Regular;Unlabored;Symmetrical  Chest Assessment Chest expansion symmetrical  Bilateral Breath Sounds Diminished  Cardiac  Pulse Regular  Heart Sounds S1, S2;S4  ECG Monitor Yes  Cardiac Rhythm NSR  Vascular  R Radial Pulse +2  L Radial Pulse +2  R Dorsalis Pedis Pulse +1  L Dorsalis Pedis Pulse +1  Edema Generalized  Generalized Edema +1  Integumentary  Integumentary (WDL) WDL  Musculoskeletal  Musculoskeletal (WDL) X  Generalized Weakness Yes  Gastrointestinal  Bowel Sounds Assessment Active  GU Assessment  Genitourinary (WDL) X (HD pt)  Psychosocial  Psychosocial (WDL) X  Patient Behaviors Agitated  Emotional support given Given to patient

## 2017-11-20 NOTE — Progress Notes (Deleted)
Post HD Treatment  Pt tolerated HD treatment well other than a minor complaint of itching. MD ordered Benadryl for itching. Patient  requested to end treatment 22 minutes early. MD aware. Educated patient on benefits of remaining on treatment for full amount of time. Patient refused education and demanded to be taken off machine. His net UF was 4622 and his BVP was 80.2.     11/20/17 1415  Vital Signs  Temp 98.5 F (36.9 C)  Temp Source Oral  Pulse Rate 76  Pulse Rate Source Monitor  Resp 15  BP 112/76  BP Location Left Arm  BP Method Automatic  Patient Position (if appropriate) Lying  Oxygen Therapy  SpO2 94 %  O2 Device Room Air  Pain Assessment  Pain Scale 0-10  Pain Score 5  Post-Hemodialysis Assessment  Rinseback Volume (mL) 250 mL  KECN 80.2 V  Dialyzer Clearance Lightly streaked  Duration of HD Treatment -hour(s) 3 hour(s)  Hemodialysis Intake (mL) 500 mL  UF Total -Machine (mL) 5108 mL  Net UF (mL) 4608 mL  Tolerated HD Treatment Yes  Post-Hemodialysis Comments tolerated treatment well  Hemodialysis Catheter Right Internal jugular Double-lumen  No Placement Date or Time found.   Placed prior to admission: Yes  Orientation: Right  Access Location: Internal jugular  Hemodialysis Catheter Type: Double-lumen  Site Condition No complications  Blue Lumen Status Heparin locked  Red Lumen Status Heparin locked  Catheter fill solution Heparin 1000 units/ml  Catheter fill volume (Arterial) 1.5 cc  Catheter fill volume (Venous) 1.5  Dressing Type Biopatch  Dressing Status Clean;Dry;Intact  Post treatment catheter status Capped and Clamped

## 2017-11-20 NOTE — Progress Notes (Signed)
Pre HD Treatment    11/20/17 1000  Vital Signs  Temp 97.9 F (36.6 C)  Temp Source Oral  Pulse Rate 70  Pulse Rate Source Monitor  Resp 13  BP 107/76  BP Location Left Arm  BP Method Automatic  Patient Position (if appropriate) Lying  Oxygen Therapy  SpO2 96 %  O2 Device Room Air  Pain Assessment  Pain Scale 0-10  Pain Score 7 (pt states his pain is decreasing)  POSS Scale ([place] Opioid Sedation Scale)  POSS *See Group Information* 1-Acceptable,Awake and alert  Dialysis Weight  Weight 89.2 kg  Type of Weight Pre-Dialysis  Time-Out for Hemodialysis  What Procedure? HD  Pt Identifiers(min of two) First/Last Name;MRN/Account#;Pt's DOB(use if MRN/Acct# not available  Correct Site? Yes  Correct Side? Yes  Correct Procedure? Yes  Consents Verified? Yes  Rad Studies Available? N/A  Safety Precautions Reviewed? Yes  [place] Number 2  UF/Alarm Test Passed  Conductivity: Meter 14  Conductivity: Machine  14  pH 7.4  Reverse Osmosis Main  Normal Saline Lot Number [id number]  Dialyzer Lot Number [id number]  Disposable Set Lot Number [id number]  Machine Temperature 98.6 F (37 C)  Musician and Audible Yes  Blood Lines Intact and Secured Yes  Pre Treatment Patient Checks  Vascular access used during treatment Catheter  Hepatitis B Surface Antigen Results Negative  Date Hepatitis B Surface Antigen Drawn 06/16/17  Hepatitis B Surface Antibody  (Greater than 10)  Date Hepatitis B Surface Antibody Drawn 03/01/17  Hemodialysis Consent Verified Yes  Hemodialysis Standing Orders Initiated Yes  ECG (Telemetry) Monitor On Yes  Prime Ordered Normal Saline  Length of  DialysisTreatment -hour(s) 4 Hour(s)  Dialysis Treatment Comments Na 140  Dialyzer Elisio 17H NR  Dialysate 1K  Dialysis Anticoagulant None  Dialysate Flow Ordered 800  Blood Flow Rate Ordered 400 mL/min  Ultrafiltration Goal 5 Liters  Pre Treatment Labs Other (Comment)  Dialysis Blood  Pressure Support Ordered Normal Saline  Education / Care Plan  Dialysis Education Provided Yes  Documented Education in Care Plan Yes  Hemodialysis Catheter Right Internal jugular Double-lumen  No Placement Date or Time found.   Placed prior to admission: Yes  Orientation: Right  Access Location: Internal jugular  Hemodialysis Catheter Type: Double-lumen  Site Condition No complications  Blue Lumen Status Heparin locked  Red Lumen Status Heparin locked

## 2017-11-20 NOTE — Progress Notes (Signed)
[name] to be D/C'd Home per MD order.  Discussed follow up appointments with the patient.  Medication list explained in detail. Pt verbalized understanding.  Allergies as of 11/20/2017   No Known Allergies     Medication List    TAKE these medications   albuterol 108 (90 Base) MCG/ACT inhaler Commonly known as:  PROVENTIL HFA;VENTOLIN HFA Inhale 2 puffs into the lungs every 6 (six) hours as needed for wheezing or shortness of breath.   budesonide-formoterol 160-4.5 MCG/ACT inhaler Commonly known as:  SYMBICORT Inhale 2 puffs into the lungs daily.   calcium acetate 667 MG capsule Commonly known as:  PHOSLO Take 2,001 mg by mouth 3 (three) times daily.   DIALYVITE/ZINC Tabs Take 1 tablet by mouth daily.   ferrous sulfate 325 (65 FE) MG tablet Take 1 tablet by mouth 3 (three) times daily.   folic acid 1 MG tablet Commonly known as:  FOLVITE Take 1 tablet (1 mg total) by mouth daily.   gabapentin 300 MG capsule Commonly known as:  NEURONTIN Take 300 mg by mouth 2 (two) times daily.   ipratropium-albuterol 0.5-2.5 (3) MG/3ML Soln Commonly known as:  DUONEB Take 3 mLs by nebulization every 6 (six) hours as needed.   labetalol 100 MG tablet Commonly known as:  NORMODYNE Take 100 mg by mouth daily.   multivitamin Tabs tablet Take 1 tablet by mouth at bedtime.   nitroGLYCERIN 0.4 MG SL tablet Commonly known as:  NITROSTAT Place 1 tablet (0.4 mg total) under the tongue every 5 (five) minutes as needed.   omeprazole 40 MG capsule Commonly known as:  PRILOSEC Take 40 mg by mouth daily.   Spacer/Aero Chamber Mouthpiece Misc 1 Units by Does not apply route every 4 (four) hours as needed (wheezing).   tiotropium 18 MCG inhalation capsule Commonly known as:  SPIRIVA Place 1 capsule (18 mcg total) into inhaler and inhale daily.       Vitals:   11/20/17 1418 11/20/17 1458  BP: 105/76 111/64  Pulse: 77 76  Resp: 15 18  Temp:  98.6 F (37 C)  SpO2: 98% 97%    Tele box removed and returned. Skin clean, dry and intact without evidence of skin break dSown, no evidence of skin tears noted. IV catheter discontinued intact. Site without signs and symptoms of complications. Dressing and pressure applied. Pt denies pain at this time. No complaints noted.  An After Visit Summary was printed and given to the patient. Patient escorted via [place], and D/C home via private auto.  Rolley Sims

## 2017-11-20 NOTE — Progress Notes (Signed)
Post HD Treatment  Pt tolerated HD treatment well other than a minor complaint of itching. MD ordered Benadryl for itching. Patient  requested to end treatment 22 minutes early. MD aware. Educated patient on benefits of remaining on treatment for full amount of time. Patient refused education and demanded to be taken off machine. His net UF was 4622 and his BVP was 80.2.     11/20/17 1415  Vital Signs  Temp 98.5 F (36.9 C)  Temp Source Oral  Pulse Rate 76  Pulse Rate Source Monitor  Resp 15  BP 112/76  BP Location Left Arm  BP Method Automatic  Patient Position (if appropriate) Lying  Oxygen Therapy  SpO2 94 %  O2 Device Room Air  Pain Assessment  Pain Scale 0-10  Pain Score 5  Dialysis Weight  Weight 83 kg  Type of Weight Post-Dialysis  Post-Hemodialysis Assessment  Rinseback Volume (mL) 250 mL  KECN 80.2 V  Dialyzer Clearance Lightly streaked  Duration of HD Treatment -hour(s) 3 hour(s)  Hemodialysis Intake (mL) 500 mL  UF Total -Machine (mL) 5108 mL  Net UF (mL) 4608 mL  Tolerated HD Treatment Yes  Post-Hemodialysis Comments tolerated treatment well  Hemodialysis Catheter Right Internal jugular Double-lumen  No Placement Date or Time found.   Placed prior to admission: Yes  Orientation: Right  Access Location: Internal jugular  Hemodialysis Catheter Type: Double-lumen  Site Condition No complications  Blue Lumen Status Heparin locked  Red Lumen Status Heparin locked  Catheter fill solution Heparin 1000 units/ml  Catheter fill volume (Arterial) 1.5 cc  Catheter fill volume (Venous) 1.5  Dressing Type Biopatch  Dressing Status Clean;Dry;Intact  Post treatment catheter status Capped and Clamped

## 2017-11-20 NOTE — Plan of Care (Signed)
  Problem: Education: Goal: Knowledge of General Education information will improve Description Including pain rating scale, medication(s)/side effects and non-pharmacologic comfort measures Outcome: Progressing   Problem: Clinical Measurements: Goal: Ability to maintain clinical measurements within normal limits will improve Outcome: Progressing Goal: Will remain free from infection Outcome: Progressing Goal: Diagnostic test results will improve Outcome: Progressing Goal: Respiratory complications will improve Outcome: Progressing Goal: Cardiovascular complication will be avoided Outcome: Progressing   Problem: Pain Managment: Goal: General experience of comfort will improve Outcome: Progressing   Problem: Safety: Goal: Ability to remain free from injury will improve Outcome: Progressing   Problem: Skin Integrity: Goal: Risk for impaired skin integrity will decrease Outcome: Progressing   Problem: Clinical Measurements: Goal: Complications related to the disease process or treatment will be avoided or minimized Outcome: Progressing Goal: Dialysis access will remain free of complications Outcome: Progressing   Problem: Fluid Volume: Goal: Fluid volume balance will be maintained or improved Outcome: Progressing   Problem: Respiratory: Goal: Respiratory symptoms related to disease process will be avoided Outcome: Progressing   Problem: Urinary Elimination: Goal: Progression of disease will be identified and treated Outcome: Progressing

## 2017-11-20 NOTE — Progress Notes (Signed)
Post HD Assessment    11/20/17 1415  Neurological  Level of Consciousness Alert  Orientation Level Oriented X4  Respiratory  Respiratory Pattern Regular;Unlabored;Symmetrical  Chest Assessment Chest expansion symmetrical  Bilateral Breath Sounds Diminished  Cardiac  Pulse Regular  Heart Sounds S1, S2  ECG Monitor Yes  Cardiac Rhythm NSR  Vascular  R Radial Pulse +2  L Radial Pulse +2  R Dorsalis Pedis Pulse +1  L Dorsalis Pedis Pulse +1  Edema Generalized  Integumentary  Integumentary (WDL) WDL  Musculoskeletal  Musculoskeletal (WDL) X  Generalized Weakness Yes  GU Assessment  Genitourinary (WDL) X (HD pt)  Psychosocial  Psychosocial (WDL) X  Patient Behaviors Agitated  Emotional support given Given to patient

## 2017-11-20 NOTE — Progress Notes (Signed)
Pt off the floor for HD.

## 2017-11-20 NOTE — Progress Notes (Signed)
[facility], [place] 11/20/17  Subjective:  Patient seen and evaluated during hemodialysis. Still reports left foot pain and numbness. Tolerating dialysis treatment well.   Objective:  Vital signs in last 24 hours:  Temp:  [97.9 F (36.6 C)-98.5 F (36.9 C)] 97.9 F (36.6 C) (09/09 1000) Pulse Rate:  [69-76] 69 (09/09 1130) Resp:  [11-18] 11 (09/09 1130) BP: (95-120)/(56-82) 107/71 (09/09 1130) SpO2:  [94 %-100 %] 94 % (09/09 1130) Weight:  [86.9 kg-89.2 kg] 89.2 kg (09/09 1000)  Weight change: 5.216 kg Filed Weights   11/19/17 0346 11/20/17 0413 11/20/17 1000  Weight: 84 kg 86.9 kg 89.2 kg    Intake/Output:    Intake/Output Summary (Last 24 hours) at 11/20/2017 1140 Last data filed at 11/20/2017 0700 Gross per 24 hour  Intake 0 ml  Output 0 ml  Net 0 ml     Physical Exam: General:  No acute distress, laying in the bed  HEENT  anicteric, moist oral mucous membranes  Neck  supple  Pulm/lungs  bilateral crackles, room air  CVS/Heart  regular, no rub  Abdomen:   Distended, ascites  Extremities:  1+ lower extremity edema,   Neurologic:  Alert, oriented  Skin:  No acute rashes  Access:  Right IJ PermCath       Basic Metabolic Panel:  Recent Labs  Lab 11/18/17 1705 11/18/17 2146 11/19/17 0440  NA 139  --  137  K 6.8* 5.5* 4.5  CL 106  --  101  CO2 23  --  27  GLUCOSE 128*  --  123*  BUN 56*  --  29*  CREATININE 10.69*  --  6.56*  CALCIUM 8.5*  --  7.9*  PHOS  --  4.6  --      CBC: Recent Labs  Lab 11/18/17 1705  WBC 7.8  NEUTROABS 6.1  HGB 8.5*  HCT 27.9*  MCV 76.6*  PLT 308      Lab Results  Component Value Date   HEPBSAG Negative 06/16/2017   HEPBSAB Reactive 03/01/2017   HEPBIGM Negative 06/01/2016      Microbiology:  Recent Results (from the past 240 hour(s))  MRSA PCR Screening     Status: None   Collection Time: 11/19/17  2:01 AM  Result Value Ref Range Status   MRSA by PCR NEGATIVE NEGATIVE  Final    Comment:        The GeneXpert MRSA Assay (FDA approved for NASAL specimens only), is one component of a comprehensive MRSA colonization surveillance program. It is not intended to diagnose MRSA infection nor to guide or monitor treatment for MRSA infections. Performed at [facility], [address] [place], [place] [address]     Coagulation Studies: No results for input(s): LABPROT, INR in the last 72 hours.  Urinalysis: No results for input(s): COLORURINE, LABSPEC, PHURINE, GLUCOSEU, HGBUR, BILIRUBINUR, KETONESUR, PROTEINUR, UROBILINOGEN, NITRITE, LEUKOCYTESUR in the last 72 hours.  Invalid input(s): APPERANCEUR    Imaging: Dg Chest 2 View  Result Date: 11/18/2017 CLINICAL DATA:  Leg swelling EXAM: CHEST - 2 VIEW COMPARISON:  11/10/2017 chest radiograph. FINDINGS: Right internal jugular central venous catheter terminates in the lower third of the SVC. Intact sternotomy wires. CABG clips overlie the mediastinum. Stable cardiomediastinal silhouette with mild cardiomegaly. No pneumothorax. No pleural effusion. No overt pulmonary edema. No acute consolidative airspace disease. Mild bibasilar scarring versus atelectasis. IMPRESSION: 1. Stable cardiomegaly without overt pulmonary edema. 2. Mild bibasilar scarring versus atelectasis. Electronically Signed  By: Ilona Sorrel M.D.   On: 11/18/2017 18:25   US Venous Img Lower Unilateral Left  Result Date: 11/18/2017 CLINICAL DATA:  57 year old male with LEFT LOWER extremity pain and swelling. EXAM: LEFT LOWER EXTREMITY VENOUS DOPPLER ULTRASOUND TECHNIQUE: Gray-scale sonography with graded compression, as well as color Doppler and duplex ultrasound were performed to evaluate the lower extremity deep venous systems from the level of the common femoral vein and including the common femoral, femoral, profunda femoral, popliteal and calf veins including the posterior tibial, peroneal and gastrocnemius veins when visible. The  superficial great saphenous vein was also interrogated. Spectral Doppler was utilized to evaluate flow at rest and with distal augmentation maneuvers in the common femoral, femoral and popliteal veins. COMPARISON:  None. FINDINGS: Normal flow, compressibility, and augmentation within the distal common femoral, proximal profunda femoral, proximal greater saphenous, entire femoral, popliteal veins, and imaged calf veins. IMPRESSION: No evidence of LEFT LOWER extremity deep venous thrombosis. Electronically Signed   By: Margarette Canada M.D.   On: 11/18/2017 19:05   Dg Foot Complete Left  Result Date: 11/18/2017 CLINICAL DATA:  Thickening and numbness of left foot. EXAM: LEFT FOOT - COMPLETE 3+ VIEW COMPARISON:  10/29/2016 FINDINGS: There is no evidence of fracture or dislocation. There is no evidence of arthropathy or other focal bone abnormality. Vascular calcifications noted. IMPRESSION: No acute fracture or dislocation identified about the left foot. Electronically Signed   By: Fidela Salisbury M.D.   On: 11/18/2017 18:13     Medications:   . sodium chloride     . calcium acetate  2,001 mg Oral TID WC  . Chlorhexidine Gluconate Cloth  6 each Topical Q0600  . [START ON 11/21/2017] epoetin (EPOGEN/PROCRIT) injection  4,000 Units Intravenous Q T,Th,Sa-HD  . ferrous sulfate  325 mg Oral TID  . folic acid  1 mg Oral Daily  . gabapentin  300 mg Oral BID  . heparin  5,000 Units Subcutaneous Q8H  . labetalol  100 mg Oral Daily  . mometasone-formoterol  2 puff Inhalation BID  . multivitamin  1 tablet Oral QHS  . pantoprazole  40 mg Oral Daily  . patiromer  8.4 g Oral Daily  . sodium chloride flush  3 mL Intravenous Q12H     Assessment/ Plan:  57 y.o.African American male withend stage renal disease on hemodialysis secondary to Alport's syndrome, ascites, hypertension, anemia of chronic kidney disease, coronary artery disease, peripheral vascular disease, hyperlipidemia, gastrointestinal AVMs,  pulmonary hypertension  [facility]   1.  End stage renal disease with hyperkalemia: 2.  Ascites, Pulmonary edema and lower extremity edema 3.  Left foot pain 4.  Anemia of chronic kidney disease 5.  Secondary hyperparathyroidism  Plan: Patient seen and evaluated during hemodialysis.  Appears to be tolerating well.  We plan to complete dialysis treatment today.  Still complaining of left foot pain and numbness.  We will obtain vascular surgery consultation for further evaluation of this.  He may end up requiring paracentesis in the relative near future as well as he continues to have distention.  Further plan as patient progresses.   LOS: 2 Stanley [name] 9/9/201911:40 AM  [place], [place]  Note: This note was prepared with Dragon dictation. Any transcription errors are unintentional

## 2017-11-20 NOTE — Care Management (Addendum)
Patient offers no reason as to why he missed dialysis other than "I didn't feel like it." Declines discussion as to how he can imporove his health status and decrease admissions.  Stanley [name] with [facility] is aware of admission

## 2017-11-20 NOTE — Progress Notes (Signed)
HD Treatment Initiated    11/20/17 1013  Vital Signs  Pulse Rate 70  Pulse Rate Source Monitor  Resp 14  BP Location Left Arm  BP Method Automatic  Patient Position (if appropriate) Lying  Oxygen Therapy  SpO2 97 %  O2 Device Room Air  During Hemodialysis Assessment  Blood Flow Rate (mL/min) 400 mL/min  Arterial Pressure (mmHg) -130 mmHg  Venous Pressure (mmHg) 140 mmHg  Transmembrane Pressure (mmHg) 70 mmHg  Ultrafiltration Rate (mL/min) 1380 mL/min  Dialysate Flow Rate (mL/min) 800 ml/min  Conductivity: Machine  14  HD Safety Checks Performed Yes  Dialysis Fluid Bolus Normal Saline  Bolus Amount (mL) 250 mL  Intra-Hemodialysis Comments Tx initiated  Hemodialysis Catheter Right Internal jugular Double-lumen  No Placement Date or Time found.   Placed prior to admission: Yes  Orientation: Right  Access Location: Internal jugular  Hemodialysis Catheter Type: Double-lumen  Blue Lumen Status Infusing  Red Lumen Status Infusing

## 2017-11-23 DIAGNOSIS — D631 Anemia in chronic kidney disease: Secondary | ICD-10-CM | POA: Diagnosis not present

## 2017-11-23 DIAGNOSIS — N2581 Secondary hyperparathyroidism of renal origin: Secondary | ICD-10-CM | POA: Diagnosis not present

## 2017-11-23 DIAGNOSIS — Z992 Dependence on renal dialysis: Secondary | ICD-10-CM | POA: Diagnosis not present

## 2017-11-23 DIAGNOSIS — D509 Iron deficiency anemia, unspecified: Secondary | ICD-10-CM | POA: Diagnosis not present

## 2017-11-23 DIAGNOSIS — N186 End stage renal disease: Secondary | ICD-10-CM | POA: Diagnosis not present

## 2017-11-25 DIAGNOSIS — Z992 Dependence on renal dialysis: Secondary | ICD-10-CM | POA: Diagnosis not present

## 2017-11-25 DIAGNOSIS — N186 End stage renal disease: Secondary | ICD-10-CM | POA: Diagnosis not present

## 2017-11-25 DIAGNOSIS — N2581 Secondary hyperparathyroidism of renal origin: Secondary | ICD-10-CM | POA: Diagnosis not present

## 2017-11-25 DIAGNOSIS — D509 Iron deficiency anemia, unspecified: Secondary | ICD-10-CM | POA: Diagnosis not present

## 2017-11-25 DIAGNOSIS — D631 Anemia in chronic kidney disease: Secondary | ICD-10-CM | POA: Diagnosis not present

## 2017-11-27 ENCOUNTER — Other Ambulatory Visit: Payer: Self-pay

## 2017-11-27 ENCOUNTER — Inpatient Hospital Stay: Payer: Medicare Other

## 2017-11-27 ENCOUNTER — Inpatient Hospital Stay
Admission: EM | Admit: 2017-11-27 | Discharge: 2017-11-28 | DRG: 432 | Disposition: A | Discharge status: Home / Self Care | Payer: Medicare Other | Attending: Internal Medicine | Admitting: Internal Medicine

## 2017-11-27 ENCOUNTER — Emergency Department: Payer: Medicare Other

## 2017-11-27 DIAGNOSIS — K219 Gastro-esophageal reflux disease without esophagitis: Secondary | ICD-10-CM | POA: Diagnosis present

## 2017-11-27 DIAGNOSIS — Z9111 Patient's noncompliance with dietary regimen: Secondary | ICD-10-CM

## 2017-11-27 DIAGNOSIS — R0600 Dyspnea, unspecified: Secondary | ICD-10-CM | POA: Diagnosis not present

## 2017-11-27 DIAGNOSIS — I2510 Atherosclerotic heart disease of native coronary artery without angina pectoris: Secondary | ICD-10-CM | POA: Diagnosis present

## 2017-11-27 DIAGNOSIS — N186 End stage renal disease: Secondary | ICD-10-CM | POA: Diagnosis present

## 2017-11-27 DIAGNOSIS — I132 Hypertensive heart and chronic kidney disease with heart failure and with stage 5 chronic kidney disease, or end stage renal disease: Secondary | ICD-10-CM | POA: Diagnosis present

## 2017-11-27 DIAGNOSIS — I509 Heart failure, unspecified: Secondary | ICD-10-CM | POA: Diagnosis not present

## 2017-11-27 DIAGNOSIS — F17210 Nicotine dependence, cigarettes, uncomplicated: Secondary | ICD-10-CM | POA: Diagnosis present

## 2017-11-27 DIAGNOSIS — D631 Anemia in chronic kidney disease: Secondary | ICD-10-CM | POA: Diagnosis present

## 2017-11-27 DIAGNOSIS — Z992 Dependence on renal dialysis: Secondary | ICD-10-CM | POA: Diagnosis not present

## 2017-11-27 DIAGNOSIS — Z720 Tobacco use: Secondary | ICD-10-CM | POA: Diagnosis not present

## 2017-11-27 DIAGNOSIS — M1990 Unspecified osteoarthritis, unspecified site: Secondary | ICD-10-CM | POA: Diagnosis present

## 2017-11-27 DIAGNOSIS — R188 Other ascites: Secondary | ICD-10-CM

## 2017-11-27 DIAGNOSIS — I739 Peripheral vascular disease, unspecified: Secondary | ICD-10-CM | POA: Diagnosis present

## 2017-11-27 DIAGNOSIS — Z951 Presence of aortocoronary bypass graft: Secondary | ICD-10-CM | POA: Diagnosis not present

## 2017-11-27 DIAGNOSIS — Z79899 Other long term (current) drug therapy: Secondary | ICD-10-CM

## 2017-11-27 DIAGNOSIS — J441 Chronic obstructive pulmonary disease with (acute) exacerbation: Secondary | ICD-10-CM | POA: Diagnosis present

## 2017-11-27 DIAGNOSIS — K7460 Unspecified cirrhosis of liver: Secondary | ICD-10-CM | POA: Diagnosis not present

## 2017-11-27 DIAGNOSIS — E785 Hyperlipidemia, unspecified: Secondary | ICD-10-CM | POA: Diagnosis present

## 2017-11-27 DIAGNOSIS — J449 Chronic obstructive pulmonary disease, unspecified: Secondary | ICD-10-CM | POA: Diagnosis present

## 2017-11-27 DIAGNOSIS — I248 Other forms of acute ischemic heart disease: Secondary | ICD-10-CM | POA: Diagnosis present

## 2017-11-27 DIAGNOSIS — I120 Hypertensive chronic kidney disease with stage 5 chronic kidney disease or end stage renal disease: Secondary | ICD-10-CM | POA: Diagnosis not present

## 2017-11-27 DIAGNOSIS — N2581 Secondary hyperparathyroidism of renal origin: Secondary | ICD-10-CM | POA: Diagnosis present

## 2017-11-27 DIAGNOSIS — Z841 Family history of disorders of kidney and ureter: Secondary | ICD-10-CM

## 2017-11-27 DIAGNOSIS — E875 Hyperkalemia: Secondary | ICD-10-CM | POA: Diagnosis present

## 2017-11-27 DIAGNOSIS — E8770 Fluid overload, unspecified: Secondary | ICD-10-CM | POA: Diagnosis not present

## 2017-11-27 DIAGNOSIS — Z7951 Long term (current) use of inhaled steroids: Secondary | ICD-10-CM

## 2017-11-27 DIAGNOSIS — Z800 Family history of malignant neoplasm of digestive organs: Secondary | ICD-10-CM

## 2017-11-27 DIAGNOSIS — Z716 Tobacco abuse counseling: Secondary | ICD-10-CM

## 2017-11-27 DIAGNOSIS — R0602 Shortness of breath: Secondary | ICD-10-CM | POA: Diagnosis present

## 2017-11-27 DIAGNOSIS — K7031 Alcoholic cirrhosis of liver with ascites: Secondary | ICD-10-CM

## 2017-11-27 DIAGNOSIS — I2729 Other secondary pulmonary hypertension: Secondary | ICD-10-CM | POA: Diagnosis present

## 2017-11-27 LAB — COMPREHENSIVE METABOLIC PANEL
ALT: 19 U/L (ref 0–44)
AST: 23 U/L (ref 15–41)
Albumin: 3.1 g/dL — ABNORMAL LOW (ref 3.5–5.0)
Alkaline Phosphatase: 170 U/L — ABNORMAL HIGH (ref 38–126)
Anion gap: 11 (ref 5–15)
BILIRUBIN TOTAL: 0.9 mg/dL (ref 0.3–1.2)
BUN: 34 mg/dL — AB (ref 6–20)
CALCIUM: 8.6 mg/dL — AB (ref 8.9–10.3)
CHLORIDE: 102 mmol/L (ref 98–111)
CO2: 27 mmol/L (ref 22–32)
CREATININE: 8.11 mg/dL — AB (ref 0.61–1.24)
GFR calc Af Amer: 7 mL/min — ABNORMAL LOW (ref >60)
GFR calc non Af Amer: 6 mL/min — ABNORMAL LOW (ref >60)
Glucose, Bld: 89 mg/dL (ref 70–99)
Potassium: 5.4 mmol/L — ABNORMAL HIGH (ref 3.5–5.1)
Sodium: 140 mmol/L (ref 135–145)
TOTAL PROTEIN: 6.9 g/dL (ref 6.5–8.1)

## 2017-11-27 LAB — CBC
HEMATOCRIT: 30.8 % — AB (ref 40.0–52.0)
Hemoglobin: 9.3 g/dL — ABNORMAL LOW (ref 13.0–18.0)
MCH: 24.4 pg — ABNORMAL LOW (ref 26.0–34.0)
MCHC: 30.2 g/dL — AB (ref 32.0–36.0)
MCV: 81 fL (ref 80.0–100.0)
Platelets: 202 10*3/uL (ref 150–440)
RBC: 3.8 MIL/uL — ABNORMAL LOW (ref 4.40–5.90)
RDW: 27.9 % — AB (ref 11.5–14.5)
WBC: 6 10*3/uL (ref 3.8–10.6)

## 2017-11-27 LAB — PROTIME-INR
INR: 1.13
Prothrombin Time: 14.4 seconds (ref 11.4–15.2)

## 2017-11-27 LAB — APTT: aPTT: 36 seconds (ref 24–36)

## 2017-11-27 LAB — TROPONIN I: TROPONIN I: 0.05 ng/mL — AB (ref <0.03)

## 2017-11-27 MED ORDER — ACETAMINOPHEN 325 MG PO TABS: 650.0000 mg | ORAL_TABLET | Freq: Four times a day (QID) | ORAL | Status: DC | PRN

## 2017-11-27 MED ORDER — NITROGLYCERIN 0.4 MG SL SUBL: 0.4000 mg | SUBLINGUAL_TABLET | SUBLINGUAL | Status: DC | PRN

## 2017-11-27 MED ORDER — RENA-VITE PO TABS
1.0000 | ORAL_TABLET | Freq: Every day | ORAL | Status: DC
  Administered 2017-11-27: 1 via ORAL
  Filled 2017-11-27: qty 1

## 2017-11-27 MED ORDER — MOMETASONE FURO-FORMOTEROL FUM 200-5 MCG/ACT IN AERO
2.0000 | INHALATION_SPRAY | Freq: Two times a day (BID) | RESPIRATORY_TRACT | Status: DC
  Administered 2017-11-27 – 2017-11-28 (×3): 2 via RESPIRATORY_TRACT
  Filled 2017-11-27: qty 8.8

## 2017-11-27 MED ORDER — TIOTROPIUM BROMIDE MONOHYDRATE 18 MCG IN CAPS
18.0000 ug | ORAL_CAPSULE | Freq: Every day | RESPIRATORY_TRACT | Status: DC
  Administered 2017-11-27: 18 ug via RESPIRATORY_TRACT
  Filled 2017-11-27: qty 5

## 2017-11-27 MED ORDER — ALBUTEROL SULFATE (2.5 MG/3ML) 0.083% IN NEBU: 2.5000 mg | INHALATION_SOLUTION | RESPIRATORY_TRACT | Status: DC | PRN

## 2017-11-27 MED ORDER — CALCIUM ACETATE (PHOS BINDER) 667 MG PO CAPS
2001.0000 mg | ORAL_CAPSULE | Freq: Three times a day (TID) | ORAL | Status: DC
  Administered 2017-11-27 – 2017-11-28 (×2): 2001 mg via ORAL
  Filled 2017-11-27 (×3): qty 3

## 2017-11-27 MED ORDER — DIALYVITE/ZINC PO TABS: 1.0000 | ORAL_TABLET | Freq: Every day | ORAL | Status: DC

## 2017-11-27 MED ORDER — ONDANSETRON HCL 4 MG/2ML IJ SOLN: 4.0000 mg | Freq: Four times a day (QID) | INTRAMUSCULAR | Status: DC | PRN

## 2017-11-27 MED ORDER — ALBUTEROL SULFATE (2.5 MG/3ML) 0.083% IN NEBU
INHALATION_SOLUTION | RESPIRATORY_TRACT | Status: AC
  Filled 2017-11-27: qty 3

## 2017-11-27 MED ORDER — HYDROCODONE-ACETAMINOPHEN 5-325 MG PO TABS
1.0000 | ORAL_TABLET | ORAL | Status: DC | PRN
  Administered 2017-11-27 (×2): 2 via ORAL
  Administered 2017-11-27: 1 via ORAL
  Administered 2017-11-28 (×3): 2 via ORAL
  Filled 2017-11-27 (×6): qty 2

## 2017-11-27 MED ORDER — GABAPENTIN 300 MG PO CAPS
300.0000 mg | ORAL_CAPSULE | Freq: Two times a day (BID) | ORAL | Status: DC
  Administered 2017-11-27 – 2017-11-28 (×3): 300 mg via ORAL
  Filled 2017-11-27 (×3): qty 1

## 2017-11-27 MED ORDER — ONDANSETRON HCL 4 MG PO TABS: 4.0000 mg | ORAL_TABLET | Freq: Four times a day (QID) | ORAL | Status: DC | PRN

## 2017-11-27 MED ORDER — BISACODYL 5 MG PO TBEC: 5.0000 mg | DELAYED_RELEASE_TABLET | Freq: Every day | ORAL | Status: DC | PRN

## 2017-11-27 MED ORDER — ACETAMINOPHEN 650 MG RE SUPP: 650.0000 mg | Freq: Four times a day (QID) | RECTAL | Status: DC | PRN

## 2017-11-27 MED ORDER — ALBUTEROL SULFATE (2.5 MG/3ML) 0.083% IN NEBU: 5.0000 mg | INHALATION_SOLUTION | Freq: Once | RESPIRATORY_TRACT | Status: DC

## 2017-11-27 MED ORDER — HEPARIN SODIUM (PORCINE) 5000 UNIT/ML IJ SOLN
5000.0000 [IU] | Freq: Three times a day (TID) | INTRAMUSCULAR | Status: DC
  Administered 2017-11-27 (×2): 5000 [IU] via SUBCUTANEOUS
  Filled 2017-11-27 (×2): qty 1

## 2017-11-27 MED ORDER — PANTOPRAZOLE SODIUM 40 MG PO TBEC
40.0000 mg | DELAYED_RELEASE_TABLET | Freq: Every day | ORAL | Status: DC
  Administered 2017-11-27 – 2017-11-28 (×2): 40 mg via ORAL
  Filled 2017-11-27 (×2): qty 1

## 2017-11-27 MED ORDER — IPRATROPIUM-ALBUTEROL 0.5-2.5 (3) MG/3ML IN SOLN
3.0000 mL | Freq: Once | RESPIRATORY_TRACT | Status: AC
  Administered 2017-11-27: 3 mL via RESPIRATORY_TRACT
  Filled 2017-11-27: qty 3

## 2017-11-27 MED ORDER — SENNOSIDES-DOCUSATE SODIUM 8.6-50 MG PO TABS: 1.0000 | ORAL_TABLET | Freq: Every evening | ORAL | Status: DC | PRN

## 2017-11-27 MED ORDER — DIPHENHYDRAMINE HCL 25 MG PO CAPS
25.0000 mg | ORAL_CAPSULE | Freq: Four times a day (QID) | ORAL | Status: DC | PRN
  Administered 2017-11-27 – 2017-11-28 (×2): 25 mg via ORAL
  Filled 2017-11-27 (×2): qty 1

## 2017-11-27 MED ORDER — IPRATROPIUM-ALBUTEROL 0.5-2.5 (3) MG/3ML IN SOLN
3.0000 mL | Freq: Four times a day (QID) | RESPIRATORY_TRACT | Status: DC
  Administered 2017-11-27 – 2017-11-28 (×4): 3 mL via RESPIRATORY_TRACT
  Filled 2017-11-27 (×4): qty 3

## 2017-11-27 MED ORDER — METHYLPREDNISOLONE SODIUM SUCC 40 MG IJ SOLR
40.0000 mg | Freq: Two times a day (BID) | INTRAMUSCULAR | Status: DC
  Administered 2017-11-27 – 2017-11-28 (×3): 40 mg via INTRAVENOUS
  Filled 2017-11-27 (×3): qty 1

## 2017-11-27 MED ORDER — SPACER/AERO CHAMBER MOUTHPIECE MISC: 1.0000 [IU] | Status: DC | PRN

## 2017-11-27 MED ORDER — LABETALOL HCL 100 MG PO TABS
100.0000 mg | ORAL_TABLET | Freq: Every day | ORAL | Status: DC
Start: 2017-11-27 — End: 2017-11-28
  Administered 2017-11-27 – 2017-11-28 (×2): 100 mg via ORAL
  Filled 2017-11-27 (×2): qty 1

## 2017-11-27 MED ORDER — FOLIC ACID 1 MG PO TABS
1.0000 mg | ORAL_TABLET | Freq: Every day | ORAL | Status: DC
  Administered 2017-11-27 – 2017-11-28 (×2): 1 mg via ORAL
  Filled 2017-11-27 (×2): qty 1

## 2017-11-27 MED ORDER — NICOTINE 14 MG/24HR TD PT24
14.0000 mg | MEDICATED_PATCH | Freq: Every day | TRANSDERMAL | Status: DC
  Administered 2017-11-27 – 2017-11-28 (×2): 14 mg via TRANSDERMAL
  Filled 2017-11-27 (×2): qty 1

## 2017-11-27 MED ORDER — FERROUS SULFATE 325 (65 FE) MG PO TABS
325.0000 mg | ORAL_TABLET | Freq: Three times a day (TID) | ORAL | Status: DC
  Administered 2017-11-27 – 2017-11-28 (×3): 325 mg via ORAL
  Filled 2017-11-27 (×3): qty 1

## 2017-11-27 NOTE — Progress Notes (Signed)
[facility]  ROUNDING NOTE   Subjective:   Mr. Griffon Herberg admitted to [facility] on 11/27/2017 for Shortness of breath [R06.02] COPD exacerbation ([place]) [J44.1] Ascites due to alcoholic cirrhosis ([place]) [E26.83]    Objective:  Vital signs in last 24 hours:  Temp:  [97.6 F (36.4 C)-98.2 F (36.8 C)] 97.6 F (36.4 C) (09/16 1534) Pulse Rate:  [73-86] 73 (09/16 1534) Resp:  [17-20] 17 (09/16 1534) BP: (94-136)/(55-95) 100/82 (09/16 1534) SpO2:  [91 %-100 %] 99 % (09/16 1534) Weight:  [81.6 kg] 81.6 kg (09/16 0718)  Weight change:  Filed Weights   11/27/17 0718  Weight: 81.6 kg    Intake/Output: No intake/output data recorded.   Intake/Output this shift:  Total I/O In: 450 [P.O.:450] Out: -   Physical Exam: General: NAD, laying in bed  Head: Normocephalic, atraumatic. Moist oral mucosal membranes  Eyes: Anicteric, PERRL  Neck: Supple, trachea midline  Lungs:  Clear to auscultation  Heart: Regular rate and rhythm  Abdomen:  Soft, nontender, +ascites  Extremities:  no peripheral edema.  Neurologic: Nonfocal, moving all four extremities  Skin: No lesions  Access: RIJ permcath    Basic Metabolic Panel: Recent Labs  Lab 11/27/17 0721  NA 140  K 5.4*  CL 102  CO2 27  GLUCOSE 89  BUN 34*  CREATININE 8.11*  CALCIUM 8.6*    Liver Function Tests: Recent Labs  Lab 11/27/17 0721  AST 23  ALT 19  ALKPHOS 170*  BILITOT 0.9  PROT 6.9  ALBUMIN 3.1*   No results for input(s): LIPASE, AMYLASE in the last 168 hours. No results for input(s): AMMONIA in the last 168 hours.  CBC: Recent Labs  Lab 11/27/17 0721  WBC 6.0  HGB 9.3*  HCT 30.8*  MCV 81.0  PLT 202    Cardiac Enzymes: Recent Labs  Lab 11/27/17 0721  TROPONINI 0.05*    BNP: Invalid input(s): POCBNP  CBG: No results for input(s): GLUCAP in the last 168 hours.  Microbiology: Results for orders placed or performed during the hospital encounter of 11/18/17  MRSA PCR  Screening     Status: None   Collection Time: 11/19/17  2:01 AM  Result Value Ref Range Status   MRSA by PCR NEGATIVE NEGATIVE Final    Comment:        The GeneXpert MRSA Assay (FDA approved for NASAL specimens only), is one component of a comprehensive MRSA colonization surveillance program. It is not intended to diagnose MRSA infection nor to guide or monitor treatment for MRSA infections. Performed at [facility], [address] [place], [place] [address]     Coagulation Studies: Recent Labs    11/27/17 0727  LABPROT 14.4  INR 1.13    Urinalysis: No results for input(s): COLORURINE, LABSPEC, PHURINE, GLUCOSEU, HGBUR, BILIRUBINUR, KETONESUR, PROTEINUR, UROBILINOGEN, NITRITE, LEUKOCYTESUR in the last 72 hours.  Invalid input(s): APPERANCEUR    Imaging: Dg Chest 1 View  Result Date: 11/27/2017 CLINICAL DATA:  Shortness of breath since last night. Previous episode of same last week. History of end-stage renal disease, CHF, smoker. EXAM: CHEST  1 VIEW COMPARISON:  PA and lateral chest of November 18, 2017 FINDINGS: The lungs are mildly hyperinflated. The interstitial markings are coarse. There is no alveolar infiltrate. The cardiac silhouette is enlarged. The pulmonary vascularity is mildly engorged. The sternal wires are intact. The dialysis catheter tip projects over the midportion of the SVC. The patient has undergone previous CABG. There is calcification in the  wall of the aortic arch. IMPRESSION: COPD with superimposed low-grade CHF.  No alveolar pneumonia. Thoracic aortic atherosclerosis. Electronically Signed   By: David  Martinique M.D.   On: 11/27/2017 07:42   US Paracentesis  Result Date: 11/27/2017 CLINICAL DATA:  Recurrent large volume abdominal ascites due to alcoholic cirrhosis remove up to 5 L, no albumin ordered EXAM: ULTRASOUND GUIDED PARACENTESIS TECHNIQUE: The procedure, risks (including but not limited to bleeding, infection, organ damage ), benefits,  and alternatives were explained to the patient. Questions regarding the procedure were encouraged and answered. The patient understands and consents to the procedure. Survey ultrasound of the abdomen was performed and an appropriate skin entry site in the left lower abdomen was selected. Skin site was marked, prepped with chlorhexadine, draped in usual sterile fashion, and infiltrated locally with 1% lidocaine. A Safe-T-Centesis needle was advanced into the peritoneal space until fluid could be aspirated. The sheath was advanced and the needle removed. 5 L of clear amber ascites were aspirated. The patient tolerated the procedure well. COMPLICATIONS: COMPLICATIONS none IMPRESSION: Technically successful ultrasound guided paracentesis, removing 5 L ascites. Electronically Signed   By: Lucrezia Europe M.D.   On: 11/27/2017 14:57     Medications:    . calcium acetate  2,001 mg Oral TID WC  . ferrous sulfate  325 mg Oral TID WC  . folic acid  1 mg Oral Daily  . gabapentin  300 mg Oral BID  . heparin  5,000 Units Subcutaneous Q8H  . ipratropium-albuterol  3 mL Nebulization Q6H  . labetalol  100 mg Oral Daily  . methylPREDNISolone (SOLU-MEDROL) injection  40 mg Intravenous Q12H  . mometasone-formoterol  2 puff Inhalation BID  . multivitamin  1 tablet Oral QHS  . nicotine  14 mg Transdermal Daily  . pantoprazole  40 mg Oral Daily  . tiotropium  18 mcg Inhalation Daily   acetaminophen **OR** acetaminophen, albuterol, bisacodyl, diphenhydrAMINE, HYDROcodone-acetaminophen, nitroGLYCERIN, ondansetron **OR** ondansetron (ZOFRAN) IV, senna-docusate  Assessment/ Plan:  Mr. Edmundo Tedesco is a 57 y.o. black male withend stage renal disease on hemodialysis secondary to Alport's syndrome, ascites, hypertension, anemia of chronic kidney disease, coronary artery disease, peripheral vascular disease, hyperlipidemia, gastrointestinal AVMs, pulmonary hypertension  [facility]   1.  End stage renal  diseasewith hyperkalemia: Hemodialysis scheduled for tomorrow.  2.  Ascites status post large volume ultrasound guided paracentesis today. Removed 5 liters.   3.  Hypertension: blood pressure low at 100/82  4.  Anemia of chronic kidney disease: hemoglobin 9.3 - EPO with HD treatment  5.  Secondary hyperparathyroidism - calcium acetate with meals.   Plan:    LOS: 0 Sultan Pargas 9/16/20194:52 PM

## 2017-11-27 NOTE — H&P (Signed)
[place] [facility] [place] NAME: Stanley [name]    MR#:  [id number]  DATE OF BIRTH:  12/30/1960  DATE OF ADMISSION:  11/27/2017  PRIMARY CARE PHYSICIAN: [name], MD   REQUESTING/REFERRING PHYSICIAN: Dr. Corky Downs.  CHIEF COMPLAINT:   Chief Complaint  Patient presents with  . Shortness of Breath   Worsening shortness of breath for 2 days. HISTORY OF PRESENT ILLNESS:  Stanley [name]  is a 57 y.o. male with a known history of multiple medical problems as below.  The patient has had a shortness of breath for the past 2 days.  He also complains of abdominal distention and leg swelling.  He has multiple recurrent admissions.  He got paracentesis 2 weeks ago.  Chest x-ray showedCOPD with superimposed low-grade CHF.  No alveolar pneumonia. PAST MEDICAL HISTORY:   Past Medical History:  Diagnosis Date  . Alcohol abuse   . CHF (congestive heart failure) ([place])   . Cirrhosis ([place])   . Coronary artery disease 2009  . Drug abuse ([place])   . End stage renal disease on dialysis [facility]) NEPHROLOGIST-   DR [facility]  IN [place]   HEMODIALYSIS --   TUES/  THURS/  SAT  . Gastrointestinal bleed 06/13/2017   From chart...hx of multiple GI bleeds  . GERD (gastroesophageal reflux disease)   . Hyperlipidemia   . Hypertension   . PAD (peripheral artery disease) ([place])   . Renal insufficiency    Per pt, 32 oz fluid restriction per day  . S/P triple vessel bypass 06/09/2016   2009ish  . Suicidal ideation    & HOMICIDAL IDEATION --  06-16-2013   ADMITTED TO BEHAVIOR HEALTH    PAST SURGICAL HISTORY:   Past Surgical History:  Procedure Laterality Date  . A/V FISTULAGRAM Right 06/06/2017   Procedure: A/V FISTULAGRAM;  Surgeon: Katha Cabal, MD;  Location: [place] [facility];  Service: Cardiovascular;  Laterality: Right;  . A/V SHUNT INTERVENTION N/A 06/06/2017   Procedure: A/V SHUNT INTERVENTION;  Surgeon: Katha Cabal, MD;  Location:  [place] [facility];  Service: Cardiovascular;  Laterality: N/A;  . AGILE CAPSULE N/A 06/19/2016   Procedure: AGILE CAPSULE;  Surgeon: [name], MD;  Location: [facility] ENDOSCOPY;  Service: Endoscopy;  Laterality: N/A;  . COLONOSCOPY WITH PROPOFOL N/A 06/18/2016   Procedure: COLONOSCOPY WITH PROPOFOL;  Surgeon: [name], MD;  Location: [facility] ENDOSCOPY;  Service: Endoscopy;  Laterality: N/A;  . COLONOSCOPY WITH PROPOFOL N/A 08/12/2016   Procedure: COLONOSCOPY WITH PROPOFOL;  Surgeon: [name], MD;  Location[facility] ENDOSCOPY;  Service: Endoscopy;  Laterality: N/A;  . COLONOSCOPY WITH PROPOFOL N/A 05/05/2017   Procedure: COLONOSCOPY WITH PROPOFOL;  Surgeon: Manya Silvas, MD;  Location: [facility] ENDOSCOPY;  Service: Endoscopy;  Laterality: N/A;  . CORONARY ANGIOPLASTY  ?   PT UNABLE TO TELL IF  BEFORE OR AFTER  CABG  . CORONARY ARTERY BYPASS GRAFT  2008  ([place] , [place])   3 VESSEL  . DIALYSIS FISTULA CREATION  LAST SURGERY  APPOX  2008  . ENTEROSCOPY N/A 05/10/2016   Procedure: ENTEROSCOPY;  Surgeon: Jerene Bears, MD;  Location: [place];  Service: Gastroenterology;  Laterality: N/A;  . ENTEROSCOPY N/A 08/12/2016   Procedure: ENTEROSCOPY;  Surgeon: [name], MD;  Location: [facility] ENDOSCOPY;  Service: Endoscopy;  Laterality: N/A;  . ENTEROSCOPY Left 06/03/2017   Procedure: ENTEROSCOPY;  Surgeon: Virgel Manifold, MD;  Location: [facility] ENDOSCOPY;  Service: Endoscopy;  Laterality:  Left;  Procedure date will ultimately depend on when patient is medically optimized before the procedure, pending hemodialysis and blood transfusions etc. Will place on schedule and change depending on clinical status.   . ENTEROSCOPY N/A 06/05/2017   Procedure: ENTEROSCOPY;  Surgeon: Virgel Manifold, MD;  Location: [facility] ENDOSCOPY;  Service: Endoscopy;  Laterality: N/A;  . ENTEROSCOPY N/A 06/15/2017   Procedure: Push ENTEROSCOPY;  Surgeon: [name], MD;  Location: [facility] ENDOSCOPY;  Service: Endoscopy;  Laterality:  N/A;  . ESOPHAGOGASTRODUODENOSCOPY N/A 05/07/2015   Procedure: ESOPHAGOGASTRODUODENOSCOPY (EGD);  Surgeon: Hulen Luster, MD;  Location: [facility] ENDOSCOPY;  Service: Endoscopy;  Laterality: N/A;  . ESOPHAGOGASTRODUODENOSCOPY (EGD) WITH PROPOFOL N/A 05/17/2015   Procedure: ESOPHAGOGASTRODUODENOSCOPY (EGD) WITH PROPOFOL;  Surgeon: [name], MD;  Location: [facility] ENDOSCOPY;  Service: Endoscopy;  Laterality: N/A;  . ESOPHAGOGASTRODUODENOSCOPY (EGD) WITH PROPOFOL N/A 01/20/2016   Procedure: ESOPHAGOGASTRODUODENOSCOPY (EGD) WITH PROPOFOL;  Surgeon: [name], MD;  Location: [facility] ENDOSCOPY;  Service: Endoscopy;  Laterality: N/A;  . ESOPHAGOGASTRODUODENOSCOPY (EGD) WITH PROPOFOL N/A 04/17/2016   Procedure: ESOPHAGOGASTRODUODENOSCOPY (EGD) WITH PROPOFOL;  Surgeon: Lin Landsman, MD;  Location: [facility] ENDOSCOPY;  Service: Gastroenterology;  Laterality: N/A;  . ESOPHAGOGASTRODUODENOSCOPY (EGD) WITH PROPOFOL  05/09/2016   Procedure: ESOPHAGOGASTRODUODENOSCOPY (EGD) WITH PROPOFOL;  Surgeon: Jerene Bears, MD;  Location: [place];  Service: Endoscopy;;  . ESOPHAGOGASTRODUODENOSCOPY (EGD) WITH PROPOFOL N/A 06/16/2016   Procedure: ESOPHAGOGASTRODUODENOSCOPY (EGD) WITH PROPOFOL;  Surgeon: [name], MD;  Location: [facility] ENDOSCOPY;  Service: Endoscopy;  Laterality: N/A;  . ESOPHAGOGASTRODUODENOSCOPY (EGD) WITH PROPOFOL N/A 05/05/2017   Procedure: ESOPHAGOGASTRODUODENOSCOPY (EGD) WITH PROPOFOL;  Surgeon: Manya Silvas, MD;  Location: [facility] ENDOSCOPY;  Service: Endoscopy;  Laterality: N/A;  . ESOPHAGOGASTRODUODENOSCOPY (EGD) WITH PROPOFOL N/A 06/15/2017   Procedure: ESOPHAGOGASTRODUODENOSCOPY (EGD) WITH PROPOFOL;  Surgeon: [name], MD;  Location: [facility] ENDOSCOPY;  Service: Endoscopy;  Laterality: N/A;  . GIVENS CAPSULE STUDY N/A 05/07/2016   Procedure: GIVENS CAPSULE STUDY;  Surgeon: Doran Stabler, MD;  Location: [place];  Service: Endoscopy;  Laterality: N/A;  . MANDIBULAR HARDWARE REMOVAL N/A 07/29/2013    Procedure: REMOVAL OF ARCH BARS;  Surgeon: Theodoro Kos, DO;  Location: [place];  Service: Plastics;  Laterality: N/A;  . ORIF MANDIBULAR FRACTURE N/A 06/05/2013   Procedure: REPAIR OF MANDIBULAR FRACTURE x 2 with maxillo-mandibular fixation ;  Surgeon: Theodoro Kos, DO;  Location: [place];  Service: Plastics;  Laterality: N/A;  . PARACENTESIS    . PERIPHERAL ARTERIAL STENT GRAFT Left     SOCIAL HISTORY:   Social History   Tobacco Use  . Smoking status: Current Every Day Smoker    Packs/day: 0.15    Years: 40.00    Pack years: 6.00    Types: Cigarettes  . Smokeless tobacco: Never Used  Substance Use Topics  . Alcohol use: No    Comment: pt reports quitting after learning about cirrhosis    FAMILY HISTORY:   Family History  Problem Relation Age of Onset  . Colon cancer Mother   . Cancer Father   . Cancer Sister   . Kidney disease Brother     DRUG ALLERGIES:  No Known Allergies  REVIEW OF SYSTEMS:   Review of Systems  Constitutional: Positive for malaise/fatigue. Negative for chills and fever.  HENT: Negative for sore throat.   Eyes: Negative for blurred vision and double vision.  Respiratory: Positive for cough, shortness of breath and wheezing. Negative for hemoptysis, sputum production and stridor.   Cardiovascular: Positive  for leg swelling. Negative for chest pain, palpitations and orthopnea.  Gastrointestinal: Negative for abdominal pain, blood in stool, diarrhea, melena, nausea and vomiting.       Abdominal distention.  Genitourinary: Negative for dysuria, flank pain and hematuria.  Musculoskeletal: Negative for back pain and joint pain.  Skin: Negative for rash.  Neurological: Negative for dizziness, sensory change, focal weakness, seizures, loss of consciousness, weakness and headaches.  Endo/Heme/Allergies: Negative for polydipsia.  Psychiatric/Behavioral: Negative for depression. The patient is not nervous/anxious.     MEDICATIONS AT  HOME:   Prior to Admission medications   Medication Sig Start Date End Date Taking? Authorizing Provider  albuterol (PROVENTIL HFA;VENTOLIN HFA) 108 (90 Base) MCG/ACT inhaler Inhale 2 puffs into the lungs every 6 (six) hours as needed for wheezing or shortness of breath. 04/21/17  Yes Lisa Roca, MD  B Complex-C-Zn-Folic Acid (DIALYVITE/ZINC) TABS Take 1 tablet by mouth daily. 09/08/17  Yes [provider]  budesonide-formoterol (SYMBICORT) 160-4.5 MCG/ACT inhaler Inhale 2 puffs into the lungs daily. 11/16/16  Yes Vaughan Basta, MD  calcium acetate (PHOSLO) 667 MG capsule Take 2,001 mg by mouth 3 (three) times daily.   Yes [provider]  ferrous sulfate 325 (65 FE) MG tablet Take 1 tablet by mouth 3 (three) times daily. 01/21/17  Yes [provider]  folic acid (FOLVITE) 1 MG tablet Take 1 tablet (1 mg total) by mouth daily. 10/31/17  Yes Fritzi Mandes, MD  gabapentin (NEURONTIN) 300 MG capsule Take 300 mg by mouth 2 (two) times daily.  08/16/16  Yes [provider]  ipratropium-albuterol (DUONEB) 0.5-2.5 (3) MG/3ML SOLN Take 3 mLs by nebulization every 6 (six) hours as needed. 11/28/16  Yes Wieting, Richard, MD  labetalol (NORMODYNE) 100 MG tablet Take 100 mg by mouth daily. 03/24/17  Yes [provider]  multivitamin (RENA-VIT) TABS tablet Take 1 tablet by mouth at bedtime. 10/31/17  Yes Fritzi Mandes, MD  nitroGLYCERIN (NITROSTAT) 0.4 MG SL tablet Place 1 tablet (0.4 mg total) under the tongue every 5 (five) minutes as needed. 04/12/16  Yes Wende Bushy, MD  omeprazole (PRILOSEC) 40 MG capsule Take 40 mg by mouth daily. 09/08/17  Yes [provider]  Spacer/Aero Chamber Mouthpiece MISC 1 Units by Does not apply route every 4 (four) hours as needed (wheezing). 02/19/17   Darel Hong, MD  tiotropium (SPIRIVA HANDIHALER) 18 MCG inhalation capsule Place 1 capsule (18 mcg total) into inhaler and inhale daily. 11/16/16 11/18/17  Vaughan Basta,  MD      VITAL SIGNS:  Blood pressure (!) 129/95, pulse 86, temperature 98.1 F (36.7 C), temperature source Oral, resp. rate 19, height 6\' 3"  (1.905 m), weight 81.6 kg, SpO2 97 %.  PHYSICAL EXAMINATION:  Physical Exam  GENERAL:  57 y.o.-year-old patient lying in the bed with no acute distress.  EYES: Pupils equal, round, reactive to light and accommodation. No scleral icterus. Extraocular muscles intact.  HEENT: Head atraumatic, normocephalic. Oropharynx and nasopharynx clear.  NECK:  Supple, no jugular venous distention. No thyroid enlargement, no tenderness.  LUNGS: Normal breath sounds bilaterally, expiratory wheezing, mild rales, no rhonchi or crepitation. No use of accessory muscles of respiration.  CARDIOVASCULAR: S1, S2 normal. No murmurs, rubs, or gallops.  ABDOMEN: Soft, nontender, highly distended with ascites sign. Bowel sounds present. No organomegaly or mass.  EXTREMITIES: Bilateral pedal trace edema, cyanosis, or clubbing.  NEUROLOGIC: Cranial nerves II through XII are intact. Muscle strength 5/5 in all extremities. Sensation intact. Gait not checked.  PSYCHIATRIC:  The patient is alert and oriented x 3.  SKIN: No obvious rash, lesion, or ulcer.   LABORATORY PANEL:   CBC Recent Labs  Lab 11/27/17 0721  WBC 6.0  HGB 9.3*  HCT 30.8*  PLT 202   ------------------------------------------------------------------------------------------------------------------  Chemistries  Recent Labs  Lab 11/27/17 0721  NA 140  K 5.4*  CL 102  CO2 27  GLUCOSE 89  BUN 34*  CREATININE 8.11*  CALCIUM 8.6*  AST 23  ALT 19  ALKPHOS 170*  BILITOT 0.9   ------------------------------------------------------------------------------------------------------------------  Cardiac Enzymes Recent Labs  Lab 11/27/17 0721  TROPONINI 0.05*   ------------------------------------------------------------------------------------------------------------------  RADIOLOGY:  Dg Chest 1  View  Result Date: 11/27/2017 CLINICAL DATA:  Shortness of breath since last night. Previous episode of same last week. History of end-stage renal disease, CHF, smoker. EXAM: CHEST  1 VIEW COMPARISON:  PA and lateral chest of November 18, 2017 FINDINGS: The lungs are mildly hyperinflated. The interstitial markings are coarse. There is no alveolar infiltrate. The cardiac silhouette is enlarged. The pulmonary vascularity is mildly engorged. The sternal wires are intact. The dialysis catheter tip projects over the midportion of the SVC. The patient has undergone previous CABG. There is calcification in the wall of the aortic arch. IMPRESSION: COPD with superimposed low-grade CHF.  No alveolar pneumonia. Thoracic aortic atherosclerosis. Electronically Signed   By: David  Martinique M.D.   On: 11/27/2017 07:42      IMPRESSION AND PLAN:   COPD exacerbation and the fluid overload. The patient will be admitted to medical floor. Continue IV Solu-Medrol, DuoNeb, oxygen by nasal cannula as needed.  Liver cirrhosis with large amount of ascites. Ultrasound-guided paracentesis.  Fluid overload and ESRD.  Nephrology consulted for hemodialysis. Hyperkalemia.  Continue hemodialysis.  Elevated troponin, demanding ischemia due to above.  Anemia of chronic disease.  Stable.  Tobacco abuse.  Smoking cessation was consulted for 4 minutes.  Nicotine patch.  All the records are reviewed and case discussed with ED provider. Management plans discussed with the patient, family and they are in agreement.  CODE STATUS: Full code  TOTAL TIME TAKING CARE OF THIS PATIENT: 47 minutes.    Demetrios Loll M.D on 11/27/2017 at 11:22 AM  Between 7am to 6pm - Pager - [phone]  After 6pm go to [email or web address] - Proofreader  [employer] [facility] [place]  Office  [phone]  CC: Primary care physician; [name], MD   Note: This dictation was prepared with Dragon dictation along with  smaller phrase technology. Any transcriptional errors that result from this process are unin

## 2017-11-27 NOTE — ED Provider Notes (Signed)
[facility] Emergency Department Provider Note   ____________________________________________    I have reviewed the triage vital signs and the nursing notes.   HISTORY  Chief Complaint Shortness of Breath     HPI Stanley [name] is a 57 y.o. male with a history of cirrhosis with recurrent ascites, end-stage renal disease, COPD as well as CHF who presents today with complaints of shortness of breath which he attributes to buildup of ascites.  Patient reports he last had paracentesis 2 weeks ago.  He states that he had dialysis on Saturday 2 days ago.  Denies fevers or chills.  No chest pain.  No nausea or vomiting.   Past Medical History:  Diagnosis Date  . Alcohol abuse   . CHF (congestive heart failure) ([place])   . Cirrhosis ([place])   . Coronary artery disease 2009  . Drug abuse ([place])   . End stage renal disease on dialysis [facility]) NEPHROLOGIST-   DR [facility]  IN [place]   HEMODIALYSIS --   TUES/  THURS/  SAT  . Gastrointestinal bleed 06/13/2017   From chart...hx of multiple GI bleeds  . GERD (gastroesophageal reflux disease)   . Hyperlipidemia   . Hypertension   . PAD (peripheral artery disease) ([place])   . Renal insufficiency    Per pt, 32 oz fluid restriction per day  . S/P triple vessel bypass 06/09/2016   2009ish  . Suicidal ideation    & HOMICIDAL IDEATION --  06-16-2013   ADMITTED TO BEHAVIOR HEALTH    Patient Active Problem List   Diagnosis Date Noted  . Acute respiratory failure ([place]) 10/29/2017  . ESRD (end stage renal disease) on dialysis ([place]) 07/28/2017  . Protein-calorie malnutrition, severe 06/14/2017  . Encounter for dialysis [facility])   . Palliative care by specialist   . Goals of care, counseling/discussion   . Malnutrition of moderate degree 06/05/2017  . Secondary esophageal varices without bleeding ([place])   . Stomach irritation   . Idiopathic esophageal varices without bleeding ([employer])   . Alcoholic hepatitis with ascites  05/24/2017  . ESRD (end stage renal disease) ([place]) 04/28/2017  . Uremia 03/08/2017  . ESRD on hemodialysis ([place]) 03/03/2017  . Weakness 02/28/2017  . Hypocalcemia 02/22/2017  . Shortness of breath 11/26/2016  . COPD (chronic obstructive pulmonary disease) ([place]) 10/30/2016  . COPD exacerbation ([place]) 10/29/2016  . Anemia   . Heme positive stool   . Ulceration of intestine   . Benign neoplasm of transverse colon   . Acute gastrointestinal hemorrhage   . Esophageal candidiasis ([place])   . Angiodysplasia of intestinal tract   . Acute respiratory failure with hypoxia ([place]) 07/03/2016  . GI bleeding 06/24/2016  . Rectal bleeding 06/14/2016  . Anemia of chronic disease 06/01/2016  . MRSA carrier 06/01/2016  . Chronic renal failure 05/23/2016  . Ischemic heart disease 05/23/2016  . Angiodysplasia of small intestine   . Melena   . Small bowel bleed not requiring more than 4 units of blood in 24 hours, ICU, or surgery   . Anemia due to chronic blood loss   . Abdominal pain 05/05/2016  . Acute posthemorrhagic anemia 04/17/2016  . Gastrointestinal bleed 04/17/2016  . History of esophagogastroduodenoscopy (EGD) 04/17/2016  . Elevated troponin 04/17/2016  . Alcohol abuse 04/17/2016  . Upper GI bleed 01/19/2016  . Blood in stool   . Angiodysplasia of stomach and duodenum with hemorrhage   . Gastritis   . Reflux esophagitis   . GI  bleed 05/16/2015  . Acute GI bleeding   . Symptomatic anemia 04/30/2015  . HTN (hypertension) 04/06/2015  . GERD (gastroesophageal reflux disease) 04/06/2015  . HLD (hyperlipidemia) 04/06/2015  . Dyspnea 04/06/2015  . Cirrhosis of liver with ascites ([place]) 04/06/2015  . Ascites 04/06/2015  . GIB (gastrointestinal bleeding) 03/23/2015  . Homicidal ideation 06/19/2013  . Suicidal intent 06/19/2013  . Homicidal ideations 06/19/2013  . Hyperkalemia 06/16/2013  . Mandible fracture ([place]) 06/05/2013  . Fracture, mandible ([place]) 06/02/2013  . Coronary  atherosclerosis of native coronary artery 06/02/2013  . ESRD on dialysis ([place]) 06/02/2013  . Mandible open fracture ([place]) 06/02/2013    Past Surgical History:  Procedure Laterality Date  . A/V FISTULAGRAM Right 06/06/2017   Procedure: A/V FISTULAGRAM;  Surgeon: Katha Cabal, MD;  Location: [place] CV [facility];  Service: Cardiovascular;  Laterality: Right;  . A/V SHUNT INTERVENTION N/A 06/06/2017   Procedure: A/V SHUNT INTERVENTION;  Surgeon: Katha Cabal, MD;  Location: [place] CV [facility];  Service: Cardiovascular;  Laterality: N/A;  . AGILE CAPSULE N/A 06/19/2016   Procedure: AGILE CAPSULE;  Surgeon: [name], MD;  Location: [facility] ENDOSCOPY;  Service: Endoscopy;  Laterality: N/A;  . COLONOSCOPY WITH PROPOFOL N/A 06/18/2016   Procedure: COLONOSCOPY WITH PROPOFOL;  Surgeon: [name], MD;  Location: [facility] ENDOSCOPY;  Service: Endoscopy;  Laterality: N/A;  . COLONOSCOPY WITH PROPOFOL N/A 08/12/2016   Procedure: COLONOSCOPY WITH PROPOFOL;  Surgeon: [name], MD;  Location: [facility] ENDOSCOPY;  Service: Endoscopy;  Laterality: N/A;  . COLONOSCOPY WITH PROPOFOL N/A 05/05/2017   Procedure: COLONOSCOPY WITH PROPOFOL;  Surgeon: Manya Silvas, MD;  Location: [facility] ENDOSCOPY;  Service: Endoscopy;  Laterality: N/A;  . CORONARY ANGIOPLASTY  ?   PT UNABLE TO TELL IF  BEFORE OR AFTER  CABG  . CORONARY ARTERY BYPASS GRAFT  2008  ([place] , [place])   3 VESSEL  . DIALYSIS FISTULA CREATION  LAST SURGERY  APPOX  2008  . ENTEROSCOPY N/A 05/10/2016   Procedure: ENTEROSCOPY;  Surgeon: Jerene Bears, MD;  Location: [place];  Service: Gastroenterology;  Laterality: N/A;  . ENTEROSCOPY N/A 08/12/2016   Procedure: ENTEROSCOPY;  Surgeon: [name], MD;  Location: [facility] ENDOSCOPY;  Service: Endoscopy;  Laterality: N/A;  . ENTEROSCOPY Left 06/03/2017   Procedure: ENTEROSCOPY;  Surgeon: [place] Manifold, MD;  Location: [facility] ENDOSCOPY;  Service: Endoscopy;  Laterality: Left;  Procedure date will  ultimately depend on when patient is medically optimized before the procedure, pending hemodialysis and blood transfusions etc. Will place on schedule and change depending on clinical status.   . ENTEROSCOPY N/A 06/05/2017   Procedure: ENTEROSCOPY;  Surgeon: [place] Manifold, MD;  Location: [facility] ENDOSCOPY;  Service: Endoscopy;  Laterality: N/A;  . ENTEROSCOPY N/A 06/15/2017   Procedure: Push ENTEROSCOPY;  Surgeon: [name], MD;  [facility];  Service: Endoscopy;  Laterality: N/A;  . ESOPHAGOGASTRODUODENOSCOPY N/A 05/07/2015   Procedure: ESOPHAGOGASTRODUODENOSCOPY (EGD);  Surgeon: Hulen Luster, MD;  Location: [facility] ENDOSCOPY;  Service: Endoscopy;  Laterality: N/A;  . ESOPHAGOGASTRODUODENOSCOPY (EGD) WITH PROPOFOL N/A 05/17/2015   Procedure: ESOPHAGOGASTRODUODENOSCOPY (EGD) WITH PROPOFOL;  Surgeon: [name], MD;  Location: [facility] ENDOSCOPY;  Service: Endoscopy;  Laterality: N/A;  . ESOPHAGOGASTRODUODENOSCOPY (EGD) WITH PROPOFOL N/A 01/20/2016   Procedure: ESOPHAGOGASTRODUODENOSCOPY (EGD) WITH PROPOFOL;  Surgeon: [name], MD;  Location: [facility] ENDOSCOPY;  Service: Endoscopy;  Laterality: N/A;  . ESOPHAGOGASTRODUODENOSCOPY (EGD) WITH PROPOFOL N/A 04/17/2016   Procedure: ESOPHAGOGASTRODUODENOSCOPY (EGD) WITH PROPOFOL;  Surgeon: Lin Landsman, MD;  Location: [facility] ENDOSCOPY;  Service: Gastroenterology;  Laterality: N/A;  . ESOPHAGOGASTRODUODENOSCOPY (EGD) WITH PROPOFOL  05/09/2016   Procedure: ESOPHAGOGASTRODUODENOSCOPY (EGD) WITH PROPOFOL;  Surgeon: Jerene Bears, MD;  Location: [place];  Service: Endoscopy;;  . ESOPHAGOGASTRODUODENOSCOPY (EGD) WITH PROPOFOL N/A 06/16/2016   Procedure: ESOPHAGOGASTRODUODENOSCOPY (EGD) WITH PROPOFOL;  Surgeon: [name], MD;  Location: [facility] ENDOSCOPY;  Service: Endoscopy;  Laterality: N/A;  . ESOPHAGOGASTRODUODENOSCOPY (EGD) WITH PROPOFOL N/A 05/05/2017   Procedure: ESOPHAGOGASTRODUODENOSCOPY (EGD) WITH PROPOFOL;  Surgeon: Manya Silvas, MD;   Location: [facility] ENDOSCOPY;  Service: Endoscopy;  Laterality: N/A;  . ESOPHAGOGASTRODUODENOSCOPY (EGD) WITH PROPOFOL N/A 06/15/2017   Procedure: ESOPHAGOGASTRODUODENOSCOPY (EGD) WITH PROPOFOL;  Surgeon: [name], MD;  Location: [facility] ENDOSCOPY;  Service: Endoscopy;  Laterality: N/A;  . GIVENS CAPSULE STUDY N/A 05/07/2016   Procedure: GIVENS CAPSULE STUDY;  Surgeon: Doran Stabler, MD;  Location: [place];  Service: Endoscopy;  Laterality: N/A;  . MANDIBULAR HARDWARE REMOVAL N/A 07/29/2013   Procedure: REMOVAL OF ARCH BARS;  Surgeon: Theodoro Kos, DO;  Location: [place];  Service: Plastics;  Laterality: N/A;  . ORIF MANDIBULAR FRACTURE N/A 06/05/2013   Procedure: REPAIR OF MANDIBULAR FRACTURE x 2 with maxillo-mandibular fixation ;  Surgeon: Theodoro Kos, DO;  Location: [place];  Service: Plastics;  Laterality: N/A;  . PARACENTESIS    . PERIPHERAL ARTERIAL STENT GRAFT Left     Prior to Admission medications   Medication Sig Start Date End Date Taking? Authorizing Provider  albuterol (PROVENTIL HFA;VENTOLIN HFA) 108 (90 Base) MCG/ACT inhaler Inhale 2 puffs into the lungs every 6 (six) hours as needed for wheezing or shortness of breath. 04/21/17  Yes Lisa Roca, MD  B Complex-C-Zn-Folic Acid (DIALYVITE/ZINC) TABS Take 1 tablet by mouth daily. 09/08/17  Yes [provider]  budesonide-formoterol (SYMBICORT) 160-4.5 MCG/ACT inhaler Inhale 2 puffs into the lungs daily. 11/16/16  Yes Vaughan Basta, MD  calcium acetate (PHOSLO) 667 MG capsule Take 2,001 mg by mouth 3 (three) times daily.   Yes [provider]  ferrous sulfate 325 (65 FE) MG tablet Take 1 tablet by mouth 3 (three) times daily. 01/21/17  Yes [provider]  folic acid (FOLVITE) 1 MG tablet Take 1 tablet (1 mg total) by mouth daily. 10/31/17  Yes Fritzi Mandes, MD  gabapentin (NEURONTIN) 300 MG capsule Take 300 mg by mouth 2 (two) times daily.  08/16/16  Yes [provider]    ipratropium-albuterol (DUONEB) 0.5-2.5 (3) MG/3ML SOLN Take 3 mLs by nebulization every 6 (six) hours as needed. 11/28/16  Yes Wieting, Richard, MD  labetalol (NORMODYNE) 100 MG tablet Take 100 mg by mouth daily. 03/24/17  Yes [provider]  multivitamin (RENA-VIT) TABS tablet Take 1 tablet by mouth at bedtime. 10/31/17  Yes Fritzi Mandes, MD  nitroGLYCERIN (NITROSTAT) 0.4 MG SL tablet Place 1 tablet (0.4 mg total) under the tongue every 5 (five) minutes as needed. 04/12/16  Yes Wende Bushy, MD  omeprazole (PRILOSEC) 40 MG capsule Take 40 mg by mouth daily. 09/08/17  Yes [provider]  Spacer/Aero Chamber Mouthpiece MISC 1 Units by Does not apply route every 4 (four) hours as needed (wheezing). 02/19/17   Darel Hong, MD  tiotropium (SPIRIVA HANDIHALER) 18 MCG inhalation capsule Place 1 capsule (18 mcg total) into inhaler and inhale daily. 11/16/16 11/18/17  Vaughan Basta, MD     Allergies Patient has no known allergies.  Family History  Problem Relation Age of Onset  . Colon cancer Mother   .  Cancer Father   . Cancer Sister   . Kidney disease Brother     Social History Social History   Tobacco Use  . Smoking status: Current Every Day Smoker    Packs/day: 0.15    Years: 40.00    Pack years: 6.00    Types: Cigarettes  . Smokeless tobacco: Never Used  Substance Use Topics  . Alcohol use: No    Comment: pt reports quitting after learning about cirrhosis  . Drug use: No    Frequency: 7.0 times per week    Types: Marijuana, Cocaine    Review of Systems  Constitutional: No fever/chills Eyes: No visual changes.  ENT: No sore throat. Cardiovascular: Denies chest pain. Respiratory: Shortness of breath Gastrointestinal: Abdominal distention as above Genitourinary: Negative for dysuria. Musculoskeletal: Negative for back pain. Skin: Negative for rash. Neurological: Negative for headaches    ____________________________________________   PHYSICAL  EXAM:  VITAL SIGNS: ED Triage Vitals [11/27/17 0718]  Enc Vitals Group     BP (!) 124/93     Pulse Rate 80     Resp 20     Temp 98.2 F (36.8 C)     Temp src      SpO2 98 %     Weight 81.6 kg (180 lb)     Height 1.905 m (6\' 3" )     Head Circumference      Peak Flow      Pain Score 5     Pain Loc      Pain Edu?      Excl. in [place]?     Constitutional: Alert and oriented.    Nose: No congestion/rhinnorhea. Mouth/Throat: Mucous membranes are moist.   Neck:  Painless ROM Cardiovascular: Normal rate, regular rhythm. Grossly normal heart sounds.  Good peripheral circulation. Respiratory: Normal respiratory effort.  No retractions.  Scattered wheezing Gastrointestinal: Soft and nontender.  Significant distention consistent with ascites  Musculoskeletal: No lower extremity tenderness nor edema.  Warm and well perfused Neurologic:  Normal speech and language. No gross focal neurologic deficits are appreciated.  Skin:  Skin is warm, dry and intact. No rash noted. Psychiatric: Mood and affect are normal. Speech and behavior are normal.  ____________________________________________   LABS (all labs ordered are listed, but only abnormal results are displayed)  Labs Reviewed  CBC - Abnormal; Notable for the following components:      Result Value   RBC 3.80 (*)    Hemoglobin 9.3 (*)    HCT 30.8 (*)    MCH 24.4 (*)    MCHC 30.2 (*)    RDW 27.9 (*)    All other components within normal limits  COMPREHENSIVE METABOLIC PANEL - Abnormal; Notable for the following components:   Potassium 5.4 (*)    BUN 34 (*)    Creatinine, Ser 8.11 (*)    Calcium 8.6 (*)    Albumin 3.1 (*)    Alkaline Phosphatase 170 (*)    GFR calc non Af Amer 6 (*)    GFR calc Af Amer 7 (*)    All other components within normal limits  TROPONIN I - Abnormal; Notable for the following components:   Troponin I 0.05 (*)    All other components within normal limits  APTT  PROTIME-INR    ____________________________________________  EKG  ED ECG REPORT I, Lavonia Drafts, the attending physician, personally viewed and interpreted this ECG.  Date: 11/27/2017  Rhythm: normal sinus rhythm QRS Axis: normal Intervals: normal ST/T Wave  abnormalities: Nonspecific changes Narrative Interpretation: no evidence of acute ischemia  ____________________________________________  RADIOLOGY  No pneumonia on chest x-ray ____________________________________________   PROCEDURES  Procedure(s) performed: No  Procedures   Critical Care performed: No ____________________________________________   INITIAL IMPRESSION / ASSESSMENT AND PLAN / ED COURSE  Pertinent labs & imaging results that were available during my care of the patient were reviewed by me and considered in my medical decision making (see chart for details).  Patient presents with shortness of breath, he has both wheezing as well as significant ascites.  Will treat with DuoNeb evaluate potassium x-ray to determine whether dialysis or paracentesis may be needed.  Patient with continued wheezing on reevaluation.  Will admit for further treatment and likely paracentesis   ____________________________________________   FINAL CLINICAL IMPRESSION(S) / ED DIAGNOSES  Final diagnoses:  COPD exacerbation ([place])  Shortness of breath  Ascites due to alcoholic cirrhosis ([place])        Note:  This document was prepared using Dragon voice recognition software and may include unintentional dictation errors.    Lavonia Drafts, MD 11/27/17 1048

## 2017-11-27 NOTE — ED Triage Notes (Signed)
Pt c/o shortness of breath since last night. Seen here last week for same.

## 2017-11-28 DIAGNOSIS — N2581 Secondary hyperparathyroidism of renal origin: Secondary | ICD-10-CM | POA: Diagnosis not present

## 2017-11-28 DIAGNOSIS — D631 Anemia in chronic kidney disease: Secondary | ICD-10-CM | POA: Diagnosis not present

## 2017-11-28 DIAGNOSIS — K7460 Unspecified cirrhosis of liver: Secondary | ICD-10-CM | POA: Diagnosis not present

## 2017-11-28 DIAGNOSIS — R0602 Shortness of breath: Secondary | ICD-10-CM | POA: Diagnosis not present

## 2017-11-28 DIAGNOSIS — I120 Hypertensive chronic kidney disease with stage 5 chronic kidney disease or end stage renal disease: Secondary | ICD-10-CM | POA: Diagnosis not present

## 2017-11-28 DIAGNOSIS — E8770 Fluid overload, unspecified: Secondary | ICD-10-CM | POA: Diagnosis not present

## 2017-11-28 DIAGNOSIS — J449 Chronic obstructive pulmonary disease, unspecified: Secondary | ICD-10-CM | POA: Diagnosis not present

## 2017-11-28 DIAGNOSIS — J441 Chronic obstructive pulmonary disease with (acute) exacerbation: Secondary | ICD-10-CM | POA: Diagnosis not present

## 2017-11-28 DIAGNOSIS — I248 Other forms of acute ischemic heart disease: Secondary | ICD-10-CM | POA: Diagnosis not present

## 2017-11-28 DIAGNOSIS — I132 Hypertensive heart and chronic kidney disease with heart failure and with stage 5 chronic kidney disease, or end stage renal disease: Secondary | ICD-10-CM | POA: Diagnosis not present

## 2017-11-28 DIAGNOSIS — K7031 Alcoholic cirrhosis of liver with ascites: Secondary | ICD-10-CM | POA: Diagnosis not present

## 2017-11-28 DIAGNOSIS — N186 End stage renal disease: Secondary | ICD-10-CM | POA: Diagnosis not present

## 2017-11-28 DIAGNOSIS — E875 Hyperkalemia: Secondary | ICD-10-CM | POA: Diagnosis not present

## 2017-11-28 LAB — CBC
HCT: 29.6 % — ABNORMAL LOW (ref 40.0–52.0)
HCT: 28.3 % — ABNORMAL LOW (ref 40.0–52.0)
Hemoglobin: 9 g/dL — ABNORMAL LOW (ref 13.0–18.0)
Hemoglobin: 8.8 g/dL — ABNORMAL LOW (ref 13.0–18.0)
MCH: 24.4 pg — ABNORMAL LOW (ref 26.0–34.0)
MCH: 24.8 pg — ABNORMAL LOW (ref 26.0–34.0)
MCHC: 30.5 g/dL — ABNORMAL LOW (ref 32.0–36.0)
MCHC: 31 g/dL — ABNORMAL LOW (ref 32.0–36.0)
MCV: 80.1 fL (ref 80.0–100.0)
MCV: 80.1 fL (ref 80.0–100.0)
PLATELETS: 203 10*3/uL (ref 150–440)
PLATELETS: 202 10*3/uL (ref 150–440)
RBC: 3.7 MIL/uL — ABNORMAL LOW (ref 4.40–5.90)
RBC: 3.53 MIL/uL — AB (ref 4.40–5.90)
RDW: 27.1 % — AB (ref 11.5–14.5)
RDW: 26.5 % — AB (ref 11.5–14.5)
WBC: 5.7 10*3/uL (ref 3.8–10.6)
WBC: 7.5 10*3/uL (ref 3.8–10.6)

## 2017-11-28 LAB — BASIC METABOLIC PANEL
Anion gap: 11 (ref 5–15)
BUN: 44 mg/dL — ABNORMAL HIGH (ref 6–20)
CALCIUM: 8 mg/dL — AB (ref 8.9–10.3)
CO2: 23 mmol/L (ref 22–32)
CREATININE: 9.6 mg/dL — AB (ref 0.61–1.24)
Chloride: 100 mmol/L (ref 98–111)
GFR calc Af Amer: 6 mL/min — ABNORMAL LOW (ref >60)
GFR calc non Af Amer: 5 mL/min — ABNORMAL LOW (ref >60)
Glucose, Bld: 135 mg/dL — ABNORMAL HIGH (ref 70–99)
Potassium: 6.3 mmol/L (ref 3.5–5.1)
Sodium: 134 mmol/L — ABNORMAL LOW (ref 135–145)

## 2017-11-28 MED ORDER — CHLORHEXIDINE GLUCONATE CLOTH 2 % EX PADS: 6.0000 | MEDICATED_PAD | Freq: Every day | CUTANEOUS | Status: DC

## 2017-11-28 MED ORDER — EPOETIN ALFA 10000 UNIT/ML IJ SOLN
10000.0000 [IU] | Freq: Once | INTRAMUSCULAR | Status: AC
  Administered 2017-11-28: 10000 [IU] via INTRAVENOUS

## 2017-11-28 NOTE — Progress Notes (Signed)
[facility]  ROUNDING NOTE   Subjective:   Seen and examined on hemodialysis. Tolerating treatment well. UF of 3 liters  Objective:  Vital signs in last 24 hours:  Temp:  [97.6 F (36.4 C)-98.2 F (36.8 C)] 98.1 F (36.7 C) (09/17 1429) Pulse Rate:  [69-96] 96 (09/17 1429) Resp:  [10-20] 10 (09/17 1429) BP: (100-138)/(68-104) 137/81 (09/17 1429) SpO2:  [91 %-99 %] 97 % (09/17 1429) Weight:  [79.9 kg-83.5 kg] 79.9 kg (09/17 1408)  Weight change:  Filed Weights   11/27/17 0718 11/28/17 1021 11/28/17 1408  Weight: 81.6 kg 83.5 kg 79.9 kg    Intake/Output: I/O last 3 completed shifts: In: 450 [P.O.:450] Out: -    Intake/Output this shift:  Total I/O In: 340 [P.O.:340] Out: 3002 [Other:3002]  Physical Exam: General: NAD, laying in bed  Head: Normocephalic, atraumatic. Moist oral mucosal membranes  Eyes: Anicteric, PERRL  Neck: Supple, trachea midline  Lungs:  Clear to auscultation  Heart: Regular rate and rhythm  Abdomen:  Soft, nontender, +ascites  Extremities:  no peripheral edema.  Neurologic: Nonfocal, moving all four extremities  Skin: No lesions  Access: RIJ permcath    Basic Metabolic Panel: Recent Labs  Lab 11/27/17 0721 11/28/17 0418  NA 140 134*  K 5.4* 6.3*  CL 102 100  CO2 27 23  GLUCOSE 89 135*  BUN 34* 44*  CREATININE 8.11* 9.60*  CALCIUM 8.6* 8.0*    Liver Function Tests: Recent Labs  Lab 11/27/17 0721  AST 23  ALT 19  ALKPHOS 170*  BILITOT 0.9  PROT 6.9  ALBUMIN 3.1*   No results for input(s): LIPASE, AMYLASE in the last 168 hours. No results for input(s): AMMONIA in the last 168 hours.  CBC: Recent Labs  Lab 11/27/17 0721 11/28/17 0418 11/28/17 1046  WBC 6.0 5.7 7.5  HGB 9.3* 9.0* 8.8*  HCT 30.8* 29.6* 28.3*  MCV 81.0 80.1 80.1  PLT 202 203 202    Cardiac Enzymes: Recent Labs  Lab 11/27/17 0721  TROPONINI 0.05*    BNP: Invalid input(s): POCBNP  CBG: No results for input(s): GLUCAP in the  last 168 hours.  Microbiology: Results for orders placed or performed during the hospital encounter of 11/18/17  MRSA PCR Screening     Status: None   Collection Time: 11/19/17  2:01 AM  Result Value Ref Range Status   MRSA by PCR NEGATIVE NEGATIVE Final    Comment:        The GeneXpert MRSA Assay (FDA approved for NASAL specimens only), is one component of a comprehensive MRSA colonization surveillance program. It is not intended to diagnose MRSA infection nor to guide or monitor treatment for MRSA infections. Performed at [facility], [address] [place], [place] [address]     Coagulation Studies: Recent Labs    11/27/17 0727  LABPROT 14.4  INR 1.13    Urinalysis: No results for input(s): COLORURINE, LABSPEC, PHURINE, GLUCOSEU, HGBUR, BILIRUBINUR, KETONESUR, PROTEINUR, UROBILINOGEN, NITRITE, LEUKOCYTESUR in the last 72 hours.  Invalid input(s): APPERANCEUR    Imaging: Dg Chest 1 View  Result Date: 11/27/2017 CLINICAL DATA:  Shortness of breath since last night. Previous episode of same last week. History of end-stage renal disease, CHF, smoker. EXAM: CHEST  1 VIEW COMPARISON:  PA and lateral chest of November 18, 2017 FINDINGS: The lungs are mildly hyperinflated. The interstitial markings are coarse. There is no alveolar infiltrate. The cardiac silhouette is enlarged. The pulmonary vascularity is mildly engorged. The sternal  wires are intact. The dialysis catheter tip projects over the midportion of the SVC. The patient has undergone previous CABG. There is calcification in the wall of the aortic arch. IMPRESSION: COPD with superimposed low-grade CHF.  No alveolar pneumonia. Thoracic aortic atherosclerosis. Electronically Signed   By: David  Martinique M.D.   On: 11/27/2017 07:42   US Paracentesis  Result Date: 11/27/2017 CLINICAL DATA:  Recurrent large volume abdominal ascites due to alcoholic cirrhosis remove up to 5 L, no albumin ordered EXAM: ULTRASOUND  GUIDED PARACENTESIS TECHNIQUE: The procedure, risks (including but not limited to bleeding, infection, organ damage ), benefits, and alternatives were explained to the patient. Questions regarding the procedure were encouraged and answered. The patient understands and consents to the procedure. Survey ultrasound of the abdomen was performed and an appropriate skin entry site in the left lower abdomen was selected. Skin site was marked, prepped with chlorhexadine, draped in usual sterile fashion, and infiltrated locally with 1% lidocaine. A Safe-T-Centesis needle was advanced into the peritoneal space until fluid could be aspirated. The sheath was advanced and the needle removed. 5 L of clear amber ascites were aspirated. The patient tolerated the procedure well. COMPLICATIONS: COMPLICATIONS none IMPRESSION: Technically successful ultrasound guided paracentesis, removing 5 L ascites. Electronically Signed   By: Lucrezia Europe M.D.   On: 11/27/2017 14:57     Medications:    . calcium acetate  2,001 mg Oral TID WC  . Chlorhexidine Gluconate Cloth  6 each Topical Q0600  . ferrous sulfate  325 mg Oral TID WC  . folic acid  1 mg Oral Daily  . gabapentin  300 mg Oral BID  . heparin  5,000 Units Subcutaneous Q8H  . ipratropium-albuterol  3 mL Nebulization Q6H  . labetalol  100 mg Oral Daily  . mometasone-formoterol  2 puff Inhalation BID  . multivitamin  1 tablet Oral QHS  . nicotine  14 mg Transdermal Daily  . pantoprazole  40 mg Oral Daily   acetaminophen **OR** acetaminophen, albuterol, bisacodyl, diphenhydrAMINE, HYDROcodone-acetaminophen, nitroGLYCERIN, ondansetron **OR** ondansetron (ZOFRAN) IV, senna-docusate  Assessment/ Plan:  [name] is a 57 y.o. black male withend stage renal disease on hemodialysis secondary to Alport's syndrome, ascites, hypertension, anemia of chronic kidney disease, coronary artery disease, peripheral vascular disease, hyperlipidemia, gastrointestinal AVMs,  pulmonary hypertension  [facility] TTS   1.  End stage renal diseasewith hyperkalemia: seen and examined on hemodialysis. 2K bath.  - TTS schedule.   2.  Ascites status post large volume ultrasound guided paracentesis yesterday, 9/16. Removed 5 liters.   3.  Hypertension: blood pressure at goal  4.  Anemia of chronic kidney disease:   - EPO with HD treatment  5.  Secondary hyperparathyroidism - calcium acetate with meals.    LOS: 1 Jaggar Benko 9/17/20192:56 PM

## 2017-11-28 NOTE — Progress Notes (Signed)
HD tx end    11/28/17 1402  Vital Signs  Pulse Rate 89  Pulse Rate Source Monitor  Resp 15  BP 134/68  BP Location Left Arm  BP Method Automatic  Patient Position (if appropriate) Lying  Oxygen Therapy  SpO2 95 %  O2 Device Room Air  During Hemodialysis Assessment  Dialysis Fluid Bolus Normal Saline  Bolus Amount (mL) 250 mL  Intra-Hemodialysis Comments Tx completed

## 2017-11-28 NOTE — Progress Notes (Signed)
HD tx start    11/28/17 1027  Vital Signs  Pulse Rate 74  Pulse Rate Source Monitor  Resp 15  BP (!) 131/91  BP Location Left Arm  BP Method Automatic  Patient Position (if appropriate) Lying  Oxygen Therapy  SpO2 98 %  O2 Device Room Air  During Hemodialysis Assessment  Blood Flow Rate (mL/min) 400 mL/min  Arterial Pressure (mmHg) -140 mmHg  Venous Pressure (mmHg) 140 mmHg  Transmembrane Pressure (mmHg) 70 mmHg  Ultrafiltration Rate (mL/min) 1000 mL/min  Dialysate Flow Rate (mL/min) 600 ml/min  Conductivity: Machine  13.5  HD Safety Checks Performed Yes  Dialysis Fluid Bolus Normal Saline  Bolus Amount (mL) 250 mL  Intra-Hemodialysis Comments Tx initiated  Hemodialysis Catheter Right Internal jugular Double-lumen  No Placement Date or Time found.   Placed prior to admission: Yes  Orientation: Right  Access Location: Internal jugular  Hemodialysis Catheter Type: Double-lumen  Blue Lumen Status Infusing  Red Lumen Status Infusing

## 2017-11-28 NOTE — Progress Notes (Signed)
[place] [facility] [place] NAME: Stanley [name]    MR#:  [id number]  DATE OF BIRTH:  [birth year]  SUBJECTIVE: Admitted yesterday for shortness of breath, worsening arthritis, had emergency paracentesis 5 L of fluid.  Today he better, denies shortness of breath, chest pain.  Potassium is more than 6, patient scheduled for dialysis today.  CHIEF COMPLAINT:   Chief Complaint  Patient presents with  . Shortness of Breath    REVIEW OF SYSTEMS:   ROS CONSTITUTIONAL: No fever, fatigue or weakness.  EYES: No blurred or double vision.  EARS, NOSE, AND THROAT: No tinnitus or ear pain.  RESPIRATORY: No cough, shortness of breath, wheezing or hemoptysis.  CARDIOVASCULAR: No chest pain, orthopnea, edema.  GASTROINTESTINAL: No nausea, vomiting, diarrhea or abdominal pain.  GENITOURINARY: No dysuria, hematuria.  ENDOCRINE: No polyuria, nocturia,  HEMATOLOGY: No anemia, easy bruising or bleeding SKIN: No rash or lesion. MUSCULOSKELETAL: No joint pain or arthritis.   NEUROLOGIC: No tingling, numbness, weakness.  PSYCHIATRY: No anxiety or depression.   DRUG ALLERGIES:  No Known Allergies  VITALS:  Blood pressure 121/83, pulse 69, temperature 97.9 F (36.6 C), temperature source Oral, resp. rate 16, height 6\' 3"  (1.905 m), weight 81.6 kg, SpO2 98 %.  PHYSICAL EXAMINATION:  GENERAL:  57 y.o.-year-old patient lying in the bed with no acute distress.,  Chronically ill looking. EYES: Pupils equal, round, reactive to light and accommodation. No scleral icterus. Extraocular muscles intact.  HEENT: Head atraumatic, normocephalic. Oropharynx and nasopharynx clear.  NECK:  Supple, no jugular venous distention. No thyroid enlargement, no tenderness.  LUNGS: Normal breath sounds bilaterally, no wheezing, rales,rhonchi or crepitation. No use of accessory muscles of respiration.  CARDIOVASCULAR: S1, S2 normal. No murmurs, rubs, or gallops.  ABDOMEN: Soft,  nontender, nondistended. Bowel sounds present. No organomegaly or mass.  EXTREMITIES: No pedal edema, cyanosis, or clubbing.  NEUROLOGIC: Cranial nerves II through XII are intact. Muscle strength 5/5 in all extremities. Sensation intact. Gait not checked.  PSYCHIATRIC: The patient is alert and oriented x 3.  SKIN: No obvious rash, lesion, or ulcer.    LABORATORY PANEL:   CBC Recent Labs  Lab 11/28/17 0418  WBC 5.7  HGB 9.0*  HCT 29.6*  PLT 203   ------------------------------------------------------------------------------------------------------------------  Chemistries  Recent Labs  Lab 11/27/17 0721 11/28/17 0418  NA 140 134*  K 5.4* 6.3*  CL 102 100  CO2 27 23  GLUCOSE 89 135*  BUN 34* 44*  CREATININE 8.11* 9.60*  CALCIUM 8.6* 8.0*  AST 23  --   ALT 19  --   ALKPHOS 170*  --   BILITOT 0.9  --    ------------------------------------------------------------------------------------------------------------------  Cardiac Enzymes Recent Labs  Lab 11/27/17 0721  TROPONINI 0.05*   ------------------------------------------------------------------------------------------------------------------  RADIOLOGY:  Dg Chest 1 View  Result Date: 11/27/2017 CLINICAL DATA:  Shortness of breath since last night. Previous episode of same last week. History of end-stage renal disease, CHF, smoker. EXAM: CHEST  1 VIEW COMPARISON:  PA and lateral chest of November 18, 2017 FINDINGS: The lungs are mildly hyperinflated. The interstitial markings are coarse. There is no alveolar infiltrate. The cardiac silhouette is enlarged. The pulmonary vascularity is mildly engorged. The sternal wires are intact. The dialysis catheter tip projects over the midportion of the SVC. The patient has undergone previous CABG. There is calcification in the wall of the aortic arch. IMPRESSION: COPD with superimposed low-grade CHF.  No alveolar pneumonia. Thoracic aortic atherosclerosis. Electronically Signed  By: David  Martinique M.D.   On: 11/27/2017 07:42   US Paracentesis  Result Date: 11/27/2017 CLINICAL DATA:  Recurrent large volume abdominal ascites due to alcoholic cirrhosis remove up to 5 L, no albumin ordered EXAM: ULTRASOUND GUIDED PARACENTESIS TECHNIQUE: The procedure, risks (including but not limited to bleeding, infection, organ damage ), benefits, and alternatives were explained to the patient. Questions regarding the procedure were encouraged and answered. The patient understands and consents to the procedure. Survey ultrasound of the abdomen was performed and an appropriate skin entry site in the left lower abdomen was selected. Skin site was marked, prepped with chlorhexadine, draped in usual sterile fashion, and infiltrated locally with 1% lidocaine. A Safe-T-Centesis needle was advanced into the peritoneal space until fluid could be aspirated. The sheath was advanced and the needle removed. 5 L of clear amber ascites were aspirated. The patient tolerated the procedure well. COMPLICATIONS: COMPLICATIONS none IMPRESSION: Technically successful ultrasound guided paracentesis, removing 5 L ascites. Electronically Signed   By: Lucrezia Europe M.D.   On: 11/27/2017 14:57    EKG:   Orders placed or performed during the hospital encounter of 11/27/17  . ED EKG  . ED EKG  . ED EKG  . ED EKG    ASSESSMENT AND PLAN:  57 year old male patient with multiple recurrent paracentesis for cardiac ascites admitted yesterday for shortness of breath, had emergency paracentesis and removed 5 L of fluid. 1.  Ascites in the context of cardiorenal syndrome: Status post paracentesis, 5 L fluid removed.  Patient had recurrent ascites and had paracentesis multiple times. #2 hyperkalemia: Patient is scheduled for dialysis today. #3 ESRD on hemodialysis Tuesday, Thursday, Saturday. 4/available acute respiratory distress.  Patient not wheezing, not hypoxic today.  Because of hyperkalemia patient is dialysis, repeat  monitor of potassium.  Recurrent admissions, admitted multiple times to this hospital with similar problems.  Likely discharge today after hemodialysis if the repeat potassium improves.  All the records are reviewed and case discussed with Care Management/Social Workerr. Management plans discussed with the patient, family and they are in agreement.  CODE STATUS: full  code  TOTAL TIME TAKING CARE OF THIS PATIENT: 35 minutes.   Possible discharge in 1 to 2 days.  Epifanio Lesches M.D on 11/28/2017 at 9:11 AM  Between 7am to 6pm - Pager - [phone]  After 6pm go to [email or web address] - password [email or web address] [facility]  Office  [phone]  CC: Primary care physician; [name], MD   Note: This dictation was prepared with Dragon dictation along with smaller phrase technology. Any transcriptional errors that result from this process are unintentional.

## 2017-11-28 NOTE — Progress Notes (Signed)
Post HD assessment. Pt tolerated tx well without c/o or complication. Net UF 3002, goal met.    11/28/17 1408  Vital Signs  Temp 98.2 F (36.8 C)  Temp Source Oral  Pulse Rate 91  Pulse Rate Source Monitor  Resp 12  BP 125/72  BP Location Left Arm  BP Method Automatic  Patient Position (if appropriate) Lying  Oxygen Therapy  SpO2 96 %  O2 Device Room Air  Dialysis Weight  Weight 79.9 kg  Type of Weight Post-Dialysis  Post-Hemodialysis Assessment  Rinseback Volume (mL) 250 mL  KECN 78.8 V  Dialyzer Clearance Lightly streaked  Duration of HD Treatment -hour(s) 3.5 hour(s)  Hemodialysis Intake (mL) 500 mL  UF Total -Machine (mL) 3502 mL  Net UF (mL) 3002 mL  Tolerated HD Treatment Yes  Education / Care Plan  Dialysis Education Provided Yes  Documented Education in Care Plan Yes  Hemodialysis Catheter Right Internal jugular Double-lumen  No Placement Date or Time found.   Placed prior to admission: Yes  Orientation: Right  Access Location: Internal jugular  Hemodialysis Catheter Type: Double-lumen  Site Condition No complications  Blue Lumen Status Heparin locked  Red Lumen Status Heparin locked  Purple Lumen Status N/A  Catheter fill solution Heparin 1000 units/ml  Catheter fill volume (Arterial) 1.5 cc  Catheter fill volume (Venous) 1.5  Dressing Type Biopatch  Dressing Status Dressing changed  Interventions New dressing  Drainage Description None  Dressing Change Due 12/05/17  Post treatment catheter status Capped and Clamped

## 2017-11-28 NOTE — Care Management (Signed)
Amanda Morris dialysis liaison notified of admission.    

## 2017-11-28 NOTE — Progress Notes (Signed)
Pre HD assessment    11/28/17 1022  Neurological  Level of Consciousness Alert  Orientation Level Oriented X4  Respiratory  Respiratory Pattern Regular;Unlabored  Chest Assessment Chest expansion symmetrical  Cough Non-productive  Cardiac  ECG Monitor Yes  Cardiac Rhythm NSR  Vascular  R Radial Pulse +2  L Radial Pulse +2  Edema Generalized;Other (Comment) (abdominal distension )  Integumentary  Integumentary (WDL) X  Skin Color Appropriate for ethnicity  Musculoskeletal  Musculoskeletal (WDL) X  Generalized Weakness Yes  Assistive Device None  GU Assessment  Genitourinary (WDL) X  Genitourinary Symptoms  (HD)  Psychosocial  Psychosocial (WDL) WDL  Patient Behaviors Cooperative;Calm;Appropriate for situation  Needs Expressed Physical  Emotional support given Given to patient

## 2017-11-28 NOTE — Progress Notes (Signed)
Pre HD assessment   11/28/17 1021  Vital Signs  Temp 97.9 F (36.6 C)  Temp Source Oral  Pulse Rate 77  Pulse Rate Source Monitor  Resp 18  BP (!) 129/95  BP Location Left Arm  BP Method Automatic  Patient Position (if appropriate) Lying  Oxygen Therapy  SpO2 98 %  O2 Device Room Air  Pain Assessment  Pain Scale 0-10  Pain Score 8  Pain Location Foot  Pain Orientation Left  Pain Intervention(s) RN made aware  Dialysis Weight  Weight 83.5 kg  Type of Weight Pre-Dialysis  Time-Out for Hemodialysis  What Procedure? HD  Pt Identifiers(min of two) First/Last Name;MRN/Account#  Correct Site? Yes  Correct Side? Yes  Correct Procedure? Yes  Consents Verified? Yes  Rad Studies Available? N/A  Safety Precautions Reviewed? Yes  Engineer, civil (consulting) Number  (3A)  Station Number 1  UF/Alarm Test Passed  Conductivity: Meter 13.6  Conductivity: Machine  13.5  pH 7.6  Reverse Osmosis main  Normal Saline Lot Number [id number]  Dialyzer Lot Number [id number]  Disposable Set Lot Number [id number]  Machine Temperature 98.6 F (37 C)  Musician and Audible Yes  Blood Lines Intact and Secured Yes  Pre Treatment Patient Checks  Vascular access used during treatment Catheter  Hepatitis B Surface Antigen Results Negative  Date Hepatitis B Surface Antigen Drawn 06/16/17  Hepatitis B Surface Antibody  (>10)  Date Hepatitis B Surface Antibody Drawn 03/01/17  Hemodialysis Consent Verified Yes  Hemodialysis Standing Orders Initiated Yes  ECG (Telemetry) Monitor On Yes  Prime Ordered Normal Saline  Length of  DialysisTreatment -hour(s) 3.5 Hour(s)  Dialyzer Elisio 17H NR  Dialysate 2K, 2.5 Ca  Dialysis Anticoagulant None  Dialysate Flow Ordered 600  Blood Flow Rate Ordered 400 mL/min  Ultrafiltration Goal 3 Liters  Pre Treatment Labs CBC  Dialysis Blood Pressure Support Ordered Normal Saline  Education / Care Plan  Dialysis Education Provided Yes  Documented Education in  Care Plan Yes  Hemodialysis Catheter Right Internal jugular Double-lumen  No Placement Date or Time found.   Placed prior to admission: Yes  Orientation: Right  Access Location: Internal jugular  Hemodialysis Catheter Type: Double-lumen  Site Condition No complications  Blue Lumen Status Heparin locked  Red Lumen Status Heparin locked  Purple Lumen Status N/A  Dressing Type Gauze/Drain sponge  Dressing Status Clean;Dry;Intact;Dressing changed  Interventions New dressing  Drainage Description None

## 2017-11-28 NOTE — Progress Notes (Signed)
Discharge home after dialysis, discussed with Dr. Juleen China about hyperkalemia mentioned that with the dialysis he should get better, patient has been noncompliant with diet and eating bananas that can increase potassium.  Patient does not need to stay and have a repeat potassium checked and can be discharged home today.

## 2017-11-28 NOTE — Progress Notes (Signed)
Post HD assessment    11/28/17 1407  Neurological  Level of Consciousness Alert  Orientation Level Oriented X4  Respiratory  Respiratory Pattern Regular;Unlabored  Chest Assessment Chest expansion symmetrical  Cough Non-productive  Cardiac  ECG Monitor Yes  Cardiac Rhythm NSR  Vascular  R Radial Pulse +2  L Radial Pulse +2  Edema Generalized (abdominal distension )  Integumentary  Integumentary (WDL) X  Skin Color Appropriate for ethnicity  Musculoskeletal  Musculoskeletal (WDL) X  Generalized Weakness Yes  Assistive Device None  GU Assessment  Genitourinary (WDL) X  Genitourinary Symptoms  (HD)  Psychosocial  Psychosocial (WDL) WDL  Patient Behaviors Appropriate for situation;Cooperative;Calm  Needs Expressed Physical  Emotional support given Given to patient

## 2017-11-30 DIAGNOSIS — D509 Iron deficiency anemia, unspecified: Secondary | ICD-10-CM | POA: Diagnosis not present

## 2017-11-30 DIAGNOSIS — N186 End stage renal disease: Secondary | ICD-10-CM | POA: Diagnosis not present

## 2017-11-30 DIAGNOSIS — N2581 Secondary hyperparathyroidism of renal origin: Secondary | ICD-10-CM | POA: Diagnosis not present

## 2017-11-30 DIAGNOSIS — Z992 Dependence on renal dialysis: Secondary | ICD-10-CM | POA: Diagnosis not present

## 2017-11-30 DIAGNOSIS — D631 Anemia in chronic kidney disease: Secondary | ICD-10-CM | POA: Diagnosis not present

## 2017-11-30 NOTE — Discharge Summary (Signed)
Stanley [name], is a 57 y.o. male  DOB 04-30-1960  MRN [id number].  Admission date:  11/27/2017  Admitting Physician  Demetrios Loll, MD  Discharge Date:  11/28/2017   Primary MD  Revelo, Elyse Jarvis, MD  Recommendations for primary care physician for things to follow:   Follow-up with PCP in 1 week  Follow-up with nephrology as scheduled for dialysis needs. Admission Diagnosis  Shortness of breath [R06.02] COPD exacerbation (HCC) [J44.1] Ascites due to alcoholic cirrhosis ([place]) [W97.98]   Discharge Diagnosis  Shortness of breath [R06.02] COPD exacerbation ([place]) [J44.1] Ascites due to alcoholic cirrhosis ([place]) [X21.19]    Active Problems:   COPD exacerbation (HCC)      Past Medical History:  Diagnosis Date  . Alcohol abuse   . CHF (congestive heart failure) ([place])   . Cirrhosis ([place])   . Coronary artery disease 2009  . Drug abuse ([place])   . End stage renal disease on dialysis [facility]) NEPHROLOGIST-   DR [facility]  IN [place]   HEMODIALYSIS --   TUES/  THURS/  SAT  . Gastrointestinal bleed 06/13/2017   From chart...hx of multiple GI bleeds  . GERD (gastroesophageal reflux disease)   . Hyperlipidemia   . Hypertension   . PAD (peripheral artery disease) ([place])   . Renal insufficiency    Per pt, 32 oz fluid restriction per day  . S/P triple vessel bypass 06/09/2016   2009ish  . Suicidal ideation    & HOMICIDAL IDEATION --  06-16-2013   ADMITTED TO BEHAVIOR HEALTH    Past Surgical History:  Procedure Laterality Date  . A/V FISTULAGRAM Right 06/06/2017   Procedure: A/V FISTULAGRAM;  Surgeon: Katha Cabal, MD;  Location: [place] [facility];  Service: Cardiovascular;  Laterality: Right;  . A/V SHUNT INTERVENTION N/A 06/06/2017   Procedure: A/V SHUNT INTERVENTION;  Surgeon: Katha Cabal, MD;  Location:  [place] [facility];  Service: Cardiovascular;  Laterality: N/A;  . AGILE CAPSULE N/A 06/19/2016   Procedure: AGILE CAPSULE;  Surgeon: [name], MD;  Location: [facility] ENDOSCOPY;  Service: Endoscopy;  Laterality: N/A;  . COLONOSCOPY WITH PROPOFOL N/A 06/18/2016   Procedure: COLONOSCOPY WITH PROPOFOL;  Surgeon: [name], MD;  Location: [facility] ENDOSCOPY;  Service: Endoscopy;  Laterality: N/A;  . COLONOSCOPY WITH PROPOFOL N/A 08/12/2016   Procedure: COLONOSCOPY WITH PROPOFOL;  Surgeon: [name], MD;  Location: [facility] ENDOSCOPY;  Service: Endoscopy;  Laterality: N/A;  . COLONOSCOPY WITH PROPOFOL N/A 05/05/2017   Procedure: COLONOSCOPY WITH PROPOFOL;  Surgeon: Manya Silvas, MD;  Location: [facility] ENDOSCOPY;  Service: Endoscopy;  Laterality: N/A;  . CORONARY ANGIOPLASTY  ?   PT UNABLE TO TELL IF  BEFORE OR AFTER  CABG  . CORONARY ARTERY BYPASS GRAFT  2008  ([place] , [place])   3 VESSEL  . DIALYSIS FISTULA CREATION  LAST SURGERY  APPOX  2008  . ENTEROSCOPY N/A 05/10/2016   Procedure: ENTEROSCOPY;  Surgeon: Jerene Bears, MD;  Location: [place];  Service: Gastroenterology;  Laterality: N/A;  . ENTEROSCOPY N/A 08/12/2016   Procedure: ENTEROSCOPY;  Surgeon: [name], MD;  Location: [facility] ENDOSCOPY;  Service: Endoscopy;  Laterality: N/A;  . ENTEROSCOPY Left 06/03/2017   Procedure: ENTEROSCOPY;  Surgeon: Virgel Manifold, MD;  Location: [facility] ENDOSCOPY;  Service: Endoscopy;  Laterality: Left;  Procedure date will ultimately depend on when patient is medically optimized before the procedure, pending hemodialysis and blood transfusions etc. Will place on schedule and change depending on clinical status.   Marland Kitchen  ENTEROSCOPY N/A 06/05/2017   Procedure: ENTEROSCOPY;  Surgeon: Virgel Manifold, MD;  Location: [facility] ENDOSCOPY;  Service: Endoscopy;  Laterality: N/A;  . ENTEROSCOPY N/A 06/15/2017   Procedure: Push ENTEROSCOPY;  Surgeon: [name], MD;  Location: [facility] ENDOSCOPY;  Service: Endoscopy;  Laterality:  N/A;  . ESOPHAGOGASTRODUODENOSCOPY N/A 05/07/2015   Procedure: ESOPHAGOGASTRODUODENOSCOPY (EGD);  Surgeon: Hulen Luster, MD;  Location: [facility] ENDOSCOPY;  Service: Endoscopy;  Laterality: N/A;  . ESOPHAGOGASTRODUODENOSCOPY (EGD) WITH PROPOFOL N/A 05/17/2015   Procedure: ESOPHAGOGASTRODUODENOSCOPY (EGD) WITH PROPOFOL;  Surgeon: [name], MD;  Location: [facility] ENDOSCOPY;  Service: Endoscopy;  Laterality: N/A;  . ESOPHAGOGASTRODUODENOSCOPY (EGD) WITH PROPOFOL N/A 01/20/2016   Procedure: ESOPHAGOGASTRODUODENOSCOPY (EGD) WITH PROPOFOL;  Surgeon: [name], MD;  Location: [facility] ENDOSCOPY;  Service: Endoscopy;  Laterality: N/A;  . ESOPHAGOGASTRODUODENOSCOPY (EGD) WITH PROPOFOL N/A 04/17/2016   Procedure: ESOPHAGOGASTRODUODENOSCOPY (EGD) WITH PROPOFOL;  Surgeon: Lin Landsman, MD;  Location: [facility] ENDOSCOPY;  Service: Gastroenterology;  Laterality: N/A;  . ESOPHAGOGASTRODUODENOSCOPY (EGD) WITH PROPOFOL  05/09/2016   Procedure: ESOPHAGOGASTRODUODENOSCOPY (EGD) WITH PROPOFOL;  Surgeon: Jerene Bears, MD;  Location: [place];  Service: Endoscopy;;  . ESOPHAGOGASTRODUODENOSCOPY (EGD) WITH PROPOFOL N/A 06/16/2016   Procedure: ESOPHAGOGASTRODUODENOSCOPY (EGD) WITH PROPOFOL;  Surgeon: [name], MD;  Location: [facility] ENDOSCOPY;  Service: Endoscopy;  Laterality: N/A;  . ESOPHAGOGASTRODUODENOSCOPY (EGD) WITH PROPOFOL N/A 05/05/2017   Procedure: ESOPHAGOGASTRODUODENOSCOPY (EGD) WITH PROPOFOL;  Surgeon: Manya Silvas, MD;  Location: [facility] ENDOSCOPY;  Service: Endoscopy;  Laterality: N/A;  . ESOPHAGOGASTRODUODENOSCOPY (EGD) WITH PROPOFOL N/A 06/15/2017   Procedure: ESOPHAGOGASTRODUODENOSCOPY (EGD) WITH PROPOFOL;  Surgeon: [name], MD;  Location: [facility] ENDOSCOPY;  Service: Endoscopy;  Laterality: N/A;  . GIVENS CAPSULE STUDY N/A 05/07/2016   Procedure: GIVENS CAPSULE STUDY;  Surgeon: Doran Stabler, MD;  Location: [place];  Service: Endoscopy;  Laterality: N/A;  . MANDIBULAR HARDWARE REMOVAL N/A 07/29/2013    Procedure: REMOVAL OF ARCH BARS;  Surgeon: Theodoro Kos, DO;  Location: [place];  Service: Plastics;  Laterality: N/A;  . ORIF MANDIBULAR FRACTURE N/A 06/05/2013   Procedure: REPAIR OF MANDIBULAR FRACTURE x 2 with maxillo-mandibular fixation ;  Surgeon: Theodoro Kos, DO;  Location: [place];  Service: Plastics;  Laterality: N/A;  . PARACENTESIS    . PERIPHERAL ARTERIAL STENT GRAFT Left        History of present illness and  Hospital Course:     Kindly see H&P for history of present illness and admission details, please review complete Labs, Consult reports and Test reports for all details in brief  HPI  from the history and physical done on the day of admission 57 year old male patient with history of multiple 6 times last 6 months and had 20 visits to the ER last 6 months admissions to our hospital comes in because of shortness of breath for 2 days with abdominal swelling, leg swelling, patient chest x-ray showed CHF.  Patient had paracentesis 2 weeks ago.   Hospital Course  #1 worsening abdominal swelling, weight gain, pedal edema, patient had repeat ultrasound-guided paracentesis by radiology and 5 L of fluid removed. Patient shortness of breath improved after that.  Admitted to medical unit for close monitoring. 2.  Hyperkalemia, noncompliance with diet, patient received hemodialysis on September 17 and before dialysis potassium 6.3.  Patient received dialysis, nephrologist recommended no need to repeat potassium, patient has history of noncompliance with diet and has been having issues with potassium.  Explained the patient to be very compliant with diet.  Discharged  home in stable condition. 3.  COPD exacerbation as documented by H&P: Patient did not have any wheezing.  Steroids are stopped and discharged home in stable condition.  Did not have COPD exacerbation but his shortness of breath, abdominal swelling likely secondary to recurrent ascites requiring  paracentesis. #4 essential hypertension: Controlled  #5 recurrent ascites, patient had large volume paracentesis, excellent history of PVD Gastrointestinal AV malformations by history Pulmonary hypertension Discharge Condition: Stable   Follow UP  Follow-up Information    Revelo, Elyse Jarvis, MD. Go on 12/04/2017.   Specialty:  Family Medicine Why:  Monday September 23rd at [facility] for a follow-up appointment  Contact information: [address] [place] [address] [phone]             Discharge Instructions  and  Discharge Medications      Allergies as of 11/28/2017   No Known Allergies     Medication List    STOP taking these medications   albuterol 108 (90 Base) MCG/ACT inhaler Commonly known as:  PROVENTIL HFA;VENTOLIN HFA     TAKE these medications   budesonide-formoterol 160-4.5 MCG/ACT inhaler Commonly known as:  SYMBICORT Inhale 2 puffs into the lungs daily.   calcium acetate 667 MG capsule Commonly known as:  PHOSLO Take 2,001 mg by mouth 3 (three) times daily.   DIALYVITE/ZINC Tabs Take 1 tablet by mouth daily.   ferrous sulfate 325 (65 FE) MG tablet Take 1 tablet by mouth 3 (three) times daily.   folic acid 1 MG tablet Commonly known as:  FOLVITE Take 1 tablet (1 mg total) by mouth daily.   gabapentin 300 MG capsule Commonly known as:  NEURONTIN Take 300 mg by mouth 2 (two) times daily.   ipratropium-albuterol 0.5-2.5 (3) MG/3ML Soln Commonly known as:  DUONEB Take 3 mLs by nebulization every 6 (six) hours as needed.   labetalol 100 MG tablet Commonly known as:  NORMODYNE Take 100 mg by mouth daily.   multivitamin Tabs tablet Take 1 tablet by mouth at bedtime.   nitroGLYCERIN 0.4 MG SL tablet Commonly known as:  NITROSTAT Place 1 tablet (0.4 mg total) under the tongue every 5 (five) minutes as needed.   omeprazole 40 MG capsule Commonly known as:  PRILOSEC Take 40 mg by mouth daily.   Spacer/Aero Chamber  Mouthpiece Misc 1 Units by Does not apply route every 4 (four) hours as needed (wheezing).   tiotropium 18 MCG inhalation capsule Commonly known as:  SPIRIVA Place 1 capsule (18 mcg total) into inhaler and inhale daily.         Diet and Activity recommendation: See Discharge Instructions above   Consults obtained -nephrology   Major procedures and Radiology Reports - PLEASE review detailed and final reports for all details, in brief -      Dg Chest 1 View  Result Date: 11/27/2017 CLINICAL DATA:  Shortness of breath since last night. Previous episode of same last week. History of end-stage renal disease, CHF, smoker. EXAM: CHEST  1 VIEW COMPARISON:  PA and lateral chest of November 18, 2017 FINDINGS: The lungs are mildly hyperinflated. The interstitial markings are coarse. There is no alveolar infiltrate. The cardiac silhouette is enlarged. The pulmonary vascularity is mildly engorged. The sternal wires are intact. The dialysis catheter tip projects over the midportion of the SVC. The patient has undergone previous CABG. There is calcification in the wall of the aortic arch. IMPRESSION: COPD with superimposed low-grade CHF.  No alveolar pneumonia. Thoracic aortic  atherosclerosis. Electronically Signed   By: David  Martinique M.D.   On: 11/27/2017 07:42   Dg Chest 1 View  Result Date: 11/10/2017 CLINICAL DATA:  Shortness of breath EXAM: CHEST  1 VIEW COMPARISON:  Eighteen 19 FINDINGS: Unchanged position of right chest wall hemodialysis catheter. Remote CABG. Moderate cardiomegaly is unchanged. There is bibasilar atelectasis. No focal airspace consolidation or pulmonary edema. IMPRESSION: 1. Bibasilar atelectasis without focal consolidation. 2. Unchanged cardiomegaly with aortic atherosclerosis (ICD10-I70.0). Electronically Signed   By: Ulyses Jarred M.D.   On: 11/10/2017 18:50   Dg Chest 2 View  Result Date: 11/18/2017 CLINICAL DATA:  Leg swelling EXAM: CHEST - 2 VIEW COMPARISON:   11/10/2017 chest radiograph. FINDINGS: Right internal jugular central venous catheter terminates in the lower third of the SVC. Intact sternotomy wires. CABG clips overlie the mediastinum. Stable cardiomediastinal silhouette with mild cardiomegaly. No pneumothorax. No pleural effusion. No overt pulmonary edema. No acute consolidative airspace disease. Mild bibasilar scarring versus atelectasis. IMPRESSION: 1. Stable cardiomegaly without overt pulmonary edema. 2. Mild bibasilar scarring versus atelectasis. Electronically Signed   By: Ilona Sorrel M.D.   On: 11/18/2017 18:25   US Venous Img Lower Unilateral Left  Result Date: 11/18/2017 CLINICAL DATA:  57 year old male with LEFT LOWER extremity pain and swelling. EXAM: LEFT LOWER EXTREMITY VENOUS DOPPLER ULTRASOUND TECHNIQUE: Gray-scale sonography with graded compression, as well as color Doppler and duplex ultrasound were performed to evaluate the lower extremity deep venous systems from the level of the common femoral vein and including the common femoral, femoral, profunda femoral, popliteal and calf veins including the posterior tibial, peroneal and gastrocnemius veins when visible. The superficial great saphenous vein was also interrogated. Spectral Doppler was utilized to evaluate flow at rest and with distal augmentation maneuvers in the common femoral, femoral and popliteal veins. COMPARISON:  None. FINDINGS: Normal flow, compressibility, and augmentation within the distal common femoral, proximal profunda femoral, proximal greater saphenous, entire femoral, popliteal veins, and imaged calf veins. IMPRESSION: No evidence of LEFT LOWER extremity deep venous thrombosis. Electronically Signed   By: Margarette Canada M.D.   On: 11/18/2017 19:05   US Paracentesis  Result Date: 11/27/2017 CLINICAL DATA:  Recurrent large volume abdominal ascites due to alcoholic cirrhosis remove up to 5 L, no albumin ordered EXAM: ULTRASOUND GUIDED PARACENTESIS TECHNIQUE: The  procedure, risks (including but not limited to bleeding, infection, organ damage ), benefits, and alternatives were explained to the patient. Questions regarding the procedure were encouraged and answered. The patient understands and consents to the procedure. Survey ultrasound of the abdomen was performed and an appropriate skin entry site in the left lower abdomen was selected. Skin site was marked, prepped with chlorhexadine, draped in usual sterile fashion, and infiltrated locally with 1% lidocaine. A Safe-T-Centesis needle was advanced into the peritoneal space until fluid could be aspirated. The sheath was advanced and the needle removed. 5 L of clear amber ascites were aspirated. The patient tolerated the procedure well. COMPLICATIONS: COMPLICATIONS none IMPRESSION: Technically successful ultrasound guided paracentesis, removing 5 L ascites. Electronically Signed   By: Lucrezia Europe M.D.   On: 11/27/2017 14:57   US Paracentesis  Result Date: 11/11/2017 INDICATION: Recurrent ascites, abdominal distension, discomfort EXAM: ULTRASOUND GUIDED RIGHT PARACENTESIS MEDICATIONS: 1% lidocaine local COMPLICATIONS: None immediate. PROCEDURE: Informed written consent was obtained from the patient after a discussion of the risks, benefits and alternatives to treatment. A timeout was performed prior to the initiation of the procedure. Initial ultrasound scanning demonstrates a large amount of  ascites within the right lower abdominal quadrant. The right lower abdomen was prepped and draped in the usual sterile fashion. 1% lidocaine with epinephrine was used for local anesthesia. Following this, a 6 Fr Safe-T-Centesis catheter was introduced. An ultrasound image was saved for documentation purposes. The paracentesis was performed. The catheter was removed and a dressing was applied. The patient tolerated the procedure well without immediate post procedural complication. FINDINGS: A total of approximately 6.3 L of clear  peritoneal fluid was removed. Sample was not sent for laboratory analysis IMPRESSION: Successful ultrasound-guided paracentesis yielding 6.3 liters of peritoneal fluid. Electronically Signed   By: Jerilynn Mages.  Shick M.D.   On: 11/11/2017 15:34   Dg Foot Complete Left  Result Date: 11/18/2017 CLINICAL DATA:  Thickening and numbness of left foot. EXAM: LEFT FOOT - COMPLETE 3+ VIEW COMPARISON:  10/29/2016 FINDINGS: There is no evidence of fracture or dislocation. There is no evidence of arthropathy or other focal bone abnormality. Vascular calcifications noted. IMPRESSION: No acute fracture or dislocation identified about the left foot. Electronically Signed   By: Fidela Salisbury M.D.   On: 11/18/2017 18:13    Micro Results     No results found for this or any previous visit (from the past 240 hour(s)).     Today   Subjective:   Kayceon Oki today has no headache,no chest abdominal pain,no new weakness tingling or numbness, feels much better wants to go home today.   Objective:   Blood pressure 137/81, pulse 96, temperature 98.1 F (36.7 C), temperature source Oral, resp. rate 10, height 6\' 3"  (1.905 m), weight 79.9 kg, SpO2 97 %.  No intake or output data in the 24 hours ending 11/30/17 1200  Exam Awake Alert, Oriented x 3, No new F.N deficits, Normal affect [place].AT,PERRAL Supple Neck,No JVD, No cervical lymphadenopathy appriciated.  Symmetrical Chest wall movement, Good air movement bilaterally, CTAB RRR,No Gallops,Rubs or new Murmurs, No Parasternal Heave +ve B.Sounds, Abd Soft, Non tender, No organomegaly appriciated, No rebound -guarding or rigidity. No Cyanosis, Clubbing or edema, No new Rash or bruise  Data Review   CBC w Diff:  Lab Results  Component Value Date   WBC 7.5 11/28/2017   HGB 8.8 (L) 11/28/2017   HGB 12.7 (L) 06/10/2014   HCT 28.3 (L) 11/28/2017   HCT 25.5 (L) 01/24/2017   PLT 202 11/28/2017   PLT 201 06/10/2014   LYMPHOPCT 9 11/18/2017   LYMPHOPCT 13.6  06/10/2014   BANDSPCT 1 04/05/2015   MONOPCT 11 11/18/2017   MONOPCT 10.4 06/10/2014   EOSPCT 2 11/18/2017   EOSPCT 0.7 06/10/2014   BASOPCT 1 11/18/2017   BASOPCT 0.6 06/10/2014    CMP:  Lab Results  Component Value Date   NA 134 (L) 11/28/2017   NA 143 06/10/2014   K 6.3 (HH) 11/28/2017   K 5.1 06/10/2014   CL 100 11/28/2017   CL 102 06/10/2014   CO2 23 11/28/2017   CO2 30 06/10/2014   BUN 44 (H) 11/28/2017   BUN 20 06/10/2014   CREATININE 9.60 (H) 11/28/2017   CREATININE 5.42 (H) 06/10/2014   PROT 6.9 11/27/2017   PROT 7.3 06/10/2014   ALBUMIN 3.1 (L) 11/27/2017   ALBUMIN 3.4 (L) 06/10/2014   BILITOT 0.9 11/27/2017   BILITOT 1.9 (H) 06/10/2014   ALKPHOS 170 (H) 11/27/2017   ALKPHOS 156 (H) 06/10/2014   AST 23 11/27/2017   AST 53 (H) 06/10/2014   ALT 19 11/27/2017   ALT 32 06/10/2014  .  Total Time in preparing paper work, data evaluation and todays exam - 35 minutes  Epifanio Lesches M.D on 11/28/2017 at 12:00 PM    Note: This dictation was prepared with Dragon dictation along with smaller phrase technology. Any transcriptional errors that result from this process are unintentional.

## 2017-12-02 DIAGNOSIS — D631 Anemia in chronic kidney disease: Secondary | ICD-10-CM | POA: Diagnosis not present

## 2017-12-02 DIAGNOSIS — N2581 Secondary hyperparathyroidism of renal origin: Secondary | ICD-10-CM | POA: Diagnosis not present

## 2017-12-02 DIAGNOSIS — Z992 Dependence on renal dialysis: Secondary | ICD-10-CM | POA: Diagnosis not present

## 2017-12-02 DIAGNOSIS — N186 End stage renal disease: Secondary | ICD-10-CM | POA: Diagnosis not present

## 2017-12-02 DIAGNOSIS — D509 Iron deficiency anemia, unspecified: Secondary | ICD-10-CM | POA: Diagnosis not present

## 2017-12-03 ENCOUNTER — Emergency Department
Admission: EM | Admit: 2017-12-03 | Discharge: 2017-12-03 | Disposition: A | Discharge status: Home / Self Care | Payer: Medicare Other | Attending: Emergency Medicine | Admitting: Emergency Medicine

## 2017-12-03 ENCOUNTER — Emergency Department: Payer: Medicare Other

## 2017-12-03 ENCOUNTER — Other Ambulatory Visit: Payer: Self-pay

## 2017-12-03 DIAGNOSIS — I259 Chronic ischemic heart disease, unspecified: Secondary | ICD-10-CM | POA: Insufficient documentation

## 2017-12-03 DIAGNOSIS — I132 Hypertensive heart and chronic kidney disease with heart failure and with stage 5 chronic kidney disease, or end stage renal disease: Secondary | ICD-10-CM | POA: Diagnosis not present

## 2017-12-03 DIAGNOSIS — Z992 Dependence on renal dialysis: Secondary | ICD-10-CM | POA: Insufficient documentation

## 2017-12-03 DIAGNOSIS — K7031 Alcoholic cirrhosis of liver with ascites: Secondary | ICD-10-CM | POA: Diagnosis not present

## 2017-12-03 DIAGNOSIS — J811 Chronic pulmonary edema: Secondary | ICD-10-CM | POA: Diagnosis not present

## 2017-12-03 DIAGNOSIS — N186 End stage renal disease: Secondary | ICD-10-CM | POA: Diagnosis not present

## 2017-12-03 DIAGNOSIS — F17210 Nicotine dependence, cigarettes, uncomplicated: Secondary | ICD-10-CM | POA: Insufficient documentation

## 2017-12-03 DIAGNOSIS — I509 Heart failure, unspecified: Secondary | ICD-10-CM | POA: Insufficient documentation

## 2017-12-03 DIAGNOSIS — R109 Unspecified abdominal pain: Secondary | ICD-10-CM | POA: Diagnosis present

## 2017-12-03 DIAGNOSIS — Z79899 Other long term (current) drug therapy: Secondary | ICD-10-CM | POA: Diagnosis not present

## 2017-12-03 DIAGNOSIS — R1084 Generalized abdominal pain: Secondary | ICD-10-CM | POA: Diagnosis not present

## 2017-12-03 LAB — CBC WITH DIFFERENTIAL/PLATELET
Basophils Absolute: 0.1 10*3/uL (ref 0–0.1)
Basophils Relative: 2 %
Eosinophils Absolute: 0.1 10*3/uL (ref 0–0.7)
Eosinophils Relative: 3 %
HCT: 29.2 % — ABNORMAL LOW (ref 40.0–52.0)
HEMOGLOBIN: 8.9 g/dL — AB (ref 13.0–18.0)
LYMPHS ABS: 0.6 10*3/uL — AB (ref 1.0–3.6)
LYMPHS PCT: 12 %
MCH: 24.7 pg — AB (ref 26.0–34.0)
MCHC: 30.4 g/dL — AB (ref 32.0–36.0)
MCV: 81.1 fL (ref 80.0–100.0)
MONOS PCT: 13 %
Monocytes Absolute: 0.6 10*3/uL (ref 0.2–1.0)
NEUTROS PCT: 72 %
Neutro Abs: 3.7 10*3/uL (ref 1.4–6.5)
PLATELETS: 236 10*3/uL (ref 150–440)
RBC: 3.6 MIL/uL — AB (ref 4.40–5.90)
RDW: 27.4 % — ABNORMAL HIGH (ref 11.5–14.5)
WBC: 5.1 10*3/uL (ref 3.8–10.6)

## 2017-12-03 LAB — COMPREHENSIVE METABOLIC PANEL
ALT: 22 U/L (ref 0–44)
AST: 22 U/L (ref 15–41)
Albumin: 3.1 g/dL — ABNORMAL LOW (ref 3.5–5.0)
Alkaline Phosphatase: 149 U/L — ABNORMAL HIGH (ref 38–126)
Anion gap: 11 (ref 5–15)
BUN: 40 mg/dL — AB (ref 6–20)
CHLORIDE: 103 mmol/L (ref 98–111)
CO2: 25 mmol/L (ref 22–32)
CREATININE: 7.42 mg/dL — AB (ref 0.61–1.24)
Calcium: 8.4 mg/dL — ABNORMAL LOW (ref 8.9–10.3)
GFR calc Af Amer: 8 mL/min — ABNORMAL LOW (ref >60)
GFR, EST NON AFRICAN AMERICAN: 7 mL/min — AB (ref >60)
Glucose, Bld: 134 mg/dL — ABNORMAL HIGH (ref 70–99)
POTASSIUM: 4.3 mmol/L (ref 3.5–5.1)
SODIUM: 139 mmol/L (ref 135–145)
Total Bilirubin: 0.6 mg/dL (ref 0.3–1.2)
Total Protein: 6.4 g/dL — ABNORMAL LOW (ref 6.5–8.1)

## 2017-12-03 LAB — LIPASE, BLOOD: Lipase: 30 U/L (ref 11–51)

## 2017-12-03 NOTE — Discharge Instructions (Addendum)
Please follow-up with gastroenterology clinic tomorrow to arrange routine paracentesis to drain the fluid from your abdomen.  Your symptoms can be much better managed if you establish a schedule for having this procedure performed regularly before it causes you severe symptoms.  Your chest x-ray and labs look okay today, and do not show any need to keep you in the hospital to have this procedure performed urgently.

## 2017-12-03 NOTE — ED Triage Notes (Signed)
Pt states "my stomach is swolled up". Pt states had paracentesis on Monday and has had abd swelling since. Pt appears in no acute distress, md at bedside.

## 2017-12-03 NOTE — ED Provider Notes (Signed)
[facility] Emergency Department Provider Note  ____________________________________________  Time seen: Approximately 10:51 PM  I have reviewed the triage vital signs and the nursing notes.   HISTORY  Chief Complaint Abdominal Pain    HPI Stanley [name] is a 57 y.o. male with a history of alcoholic cirrhosis with ascites and end-stage renal disease on hemodialysis Tuesday Thursday Saturday who complains of abdominal swelling.  Last had paracentesis on Monday 6 days ago.  Reports that he does not take any medications.  No fever or chills.  No vomiting.  Eating and drinking normally.      Past Medical History:  Diagnosis Date  . Alcohol abuse   . CHF (congestive heart failure) ([place])   . Cirrhosis ([place])   . Coronary artery disease 2009  . Drug abuse ([place])   . End stage renal disease on dialysis [facility]) NEPHROLOGIST-   DR [facility]  IN [place]   HEMODIALYSIS --   TUES/  THURS/  SAT  . Gastrointestinal bleed 06/13/2017   From chart...hx of multiple GI bleeds  . GERD (gastroesophageal reflux disease)   . Hyperlipidemia   . Hypertension   . PAD (peripheral artery disease) ([place])   . Renal insufficiency    Per pt, 32 oz fluid restriction per day  . S/P triple vessel bypass 06/09/2016   2009ish  . Suicidal ideation    & HOMICIDAL IDEATION --  06-16-2013   ADMITTED TO BEHAVIOR HEALTH     Patient Active Problem List   Diagnosis Date Noted  . Acute respiratory failure ([place]) 10/29/2017  . ESRD (end stage renal disease) on dialysis ([place]) 07/28/2017  . Protein-calorie malnutrition, severe 06/14/2017  . Encounter for dialysis [facility])   . Palliative care by specialist   . Goals of care, counseling/discussion   . Malnutrition of moderate degree 06/05/2017  . Secondary esophageal varices without bleeding ([place])   . Stomach irritation   . Idiopathic esophageal varices without bleeding ([place])   . Alcoholic hepatitis with ascites 05/24/2017  . ESRD (end stage  renal disease) ([place]) 04/28/2017  . Uremia 03/08/2017  . ESRD on hemodialysis ([place]) 03/03/2017  . Weakness 02/28/2017  . Hypocalcemia 02/22/2017  . Shortness of breath 11/26/2016  . COPD (chronic obstructive pulmonary disease) ([place]) 10/30/2016  . COPD exacerbation ([place]) 10/29/2016  . Anemia   . Heme positive stool   . Ulceration of intestine   . Benign neoplasm of transverse colon   . Acute gastrointestinal hemorrhage   . Esophageal candidiasis ([place])   . Angiodysplasia of intestinal tract   . Acute respiratory failure with hypoxia ([place]) 07/03/2016  . GI bleeding 06/24/2016  . Rectal bleeding 06/14/2016  . Anemia of chronic disease 06/01/2016  . MRSA carrier 06/01/2016  . Chronic renal failure 05/23/2016  . Ischemic heart disease 05/23/2016  . Angiodysplasia of small intestine   . Melena   . Small bowel bleed not requiring more than 4 units of blood in 24 hours, ICU, or surgery   . Anemia due to chronic blood loss   . Abdominal pain 05/05/2016  . Acute posthemorrhagic anemia 04/17/2016  . Gastrointestinal bleed 04/17/2016  . History of esophagogastroduodenoscopy (EGD) 04/17/2016  . Elevated troponin 04/17/2016  . Alcohol abuse 04/17/2016  . Upper GI bleed 01/19/2016  . Blood in stool   . Angiodysplasia of stomach and duodenum with hemorrhage   . Gastritis   . Reflux esophagitis   . GI bleed 05/16/2015  . Acute GI bleeding   . Symptomatic anemia  04/30/2015  . HTN (hypertension) 04/06/2015  . GERD (gastroesophageal reflux disease) 04/06/2015  . HLD (hyperlipidemia) 04/06/2015  . Dyspnea 04/06/2015  . Cirrhosis of liver with ascites ([place]) 04/06/2015  . Ascites 04/06/2015  . GIB (gastrointestinal bleeding) 03/23/2015  . Homicidal ideation 06/19/2013  . Suicidal intent 06/19/2013  . Homicidal ideations 06/19/2013  . Hyperkalemia 06/16/2013  . Mandible fracture ([place]) 06/05/2013  . Fracture, mandible ([place]) 06/02/2013  . Coronary atherosclerosis of native coronary artery  06/02/2013  . ESRD on dialysis ([place]) 06/02/2013  . Mandible open fracture ([place]) 06/02/2013     Past Surgical History:  Procedure Laterality Date  . A/V FISTULAGRAM Right 06/06/2017   Procedure: A/V FISTULAGRAM;  Surgeon: Katha Cabal, MD;  Location: [facility];  Service: Cardiovascular;  Laterality: Right;  . A/V SHUNT INTERVENTION N/A 06/06/2017   Procedure: A/V SHUNT INTERVENTION;  Surgeon: Katha Cabal, MD;  Location: [place] [facility];  Service: Cardiovascular;  Laterality: N/A;  . AGILE CAPSULE N/A 06/19/2016   Procedure: AGILE CAPSULE;  Surgeon: [name], MD;  Location: [facility] ENDOSCOPY;  Service: Endoscopy;  Laterality: N/A;  . COLONOSCOPY WITH PROPOFOL N/A 06/18/2016   Procedure: COLONOSCOPY WITH PROPOFOL;  Surgeon: [name], MD;  Location: [facility] ENDOSCOPY;  Service: Endoscopy;  Laterality: N/A;  . COLONOSCOPY WITH PROPOFOL N/A 08/12/2016   Procedure: COLONOSCOPY WITH PROPOFOL;  Surgeon: [name], MD;  Location: [facility] ENDOSCOPY;  Service: Endoscopy;  Laterality: N/A;  . COLONOSCOPY WITH PROPOFOL N/A 05/05/2017   Procedure: COLONOSCOPY WITH PROPOFOL;  Surgeon: Manya Silvas, MD;  Location: [facility] ENDOSCOPY;  Service: Endoscopy;  Laterality: N/A;  . CORONARY ANGIOPLASTY  ?   PT UNABLE TO TELL IF  BEFORE OR AFTER  CABG  . CORONARY ARTERY BYPASS GRAFT  2008  ([place])   3 VESSEL  . DIALYSIS FISTULA CREATION  LAST SURGERY  APPOX  2008  . ENTEROSCOPY N/A 05/10/2016   Procedure: ENTEROSCOPY;  Surgeon: Jerene Bears, MD;  Location: [place];  Service: Gastroenterology;  Laterality: N/A;  . ENTEROSCOPY N/A 08/12/2016   Procedure: ENTEROSCOPY;  Surgeon: [name], MD;  Location: [facility] ENDOSCOPY;  Service: Endoscopy;  Laterality: N/A;  . ENTEROSCOPY Left 06/03/2017   Procedure: ENTEROSCOPY;  Surgeon: Virgel Manifold, MD;  Location: [facility] ENDOSCOPY;  Service: Endoscopy;  Laterality: Left;  Procedure date will ultimately depend on when patient is medically  optimized before the procedure, pending hemodialysis and blood transfusions etc. Will place on schedule and change depending on clinical status.   . ENTEROSCOPY N/A 06/05/2017   Procedure: ENTEROSCOPY;  Surgeon: Virgel Manifold, MD;  Location: [facility] ENDOSCOPY;  Service: Endoscopy;  Laterality: N/A;  . ENTEROSCOPY N/A 06/15/2017   Procedure: Push ENTEROSCOPY;  Surgeon: [name], MD;  Location[facility] ENDOSCOPY;  Service: Endoscopy;  Laterality: N/A;  . ESOPHAGOGASTRODUODENOSCOPY N/A 05/07/2015   Procedure: ESOPHAGOGASTRODUODENOSCOPY (EGD);  Surgeon: Hulen Luster, MD;  Location: [facility] ENDOSCOPY;  Service: Endoscopy;  Laterality: N/A;  . ESOPHAGOGASTRODUODENOSCOPY (EGD) WITH PROPOFOL N/A 05/17/2015   Procedure: ESOPHAGOGASTRODUODENOSCOPY (EGD) WITH PROPOFOL;  Surgeon: [name], MD;  Location: [facility] ENDOSCOPY;  Service: Endoscopy;  Laterality: N/A;  . ESOPHAGOGASTRODUODENOSCOPY (EGD) WITH PROPOFOL N/A 01/20/2016   Procedure: ESOPHAGOGASTRODUODENOSCOPY (EGD) WITH PROPOFOL;  Surgeon: [name], MD;  Location: [facility] ENDOSCOPY;  Service: Endoscopy;  Laterality: N/A;  . ESOPHAGOGASTRODUODENOSCOPY (EGD) WITH PROPOFOL N/A 04/17/2016   Procedure: ESOPHAGOGASTRODUODENOSCOPY (EGD) WITH PROPOFOL;  Surgeon: Lin Landsman, MD;  Location: [facility] ENDOSCOPY;  Service: Gastroenterology;  Laterality: N/A;  .  ESOPHAGOGASTRODUODENOSCOPY (EGD) WITH PROPOFOL  05/09/2016   Procedure: ESOPHAGOGASTRODUODENOSCOPY (EGD) WITH PROPOFOL;  Surgeon: Jerene Bears, MD;  Location: [place];  Service: Endoscopy;;  . ESOPHAGOGASTRODUODENOSCOPY (EGD) WITH PROPOFOL N/A 06/16/2016   Procedure: ESOPHAGOGASTRODUODENOSCOPY (EGD) WITH PROPOFOL;  Surgeon: [name], MD;  Location: [facility] ENDOSCOPY;  Service: Endoscopy;  Laterality: N/A;  . ESOPHAGOGASTRODUODENOSCOPY (EGD) WITH PROPOFOL N/A 05/05/2017   Procedure: ESOPHAGOGASTRODUODENOSCOPY (EGD) WITH PROPOFOL;  Surgeon: Manya Silvas, MD;  Location: [facility] ENDOSCOPY;  Service: Endoscopy;   Laterality: N/A;  . ESOPHAGOGASTRODUODENOSCOPY (EGD) WITH PROPOFOL N/A 06/15/2017   Procedure: ESOPHAGOGASTRODUODENOSCOPY (EGD) WITH PROPOFOL;  Surgeon: [name], MD;  Location: [facility] ENDOSCOPY;  Service: Endoscopy;  Laterality: N/A;  . GIVENS CAPSULE STUDY N/A 05/07/2016   Procedure: GIVENS CAPSULE STUDY;  Surgeon: Doran Stabler, MD;  Location: [place];  Service: Endoscopy;  Laterality: N/A;  . MANDIBULAR HARDWARE REMOVAL N/A 07/29/2013   Procedure: REMOVAL OF ARCH BARS;  Surgeon: Theodoro Kos, DO;  Location: [place];  Service: Plastics;  Laterality: N/A;  . ORIF MANDIBULAR FRACTURE N/A 06/05/2013   Procedure: REPAIR OF MANDIBULAR FRACTURE x 2 with maxillo-mandibular fixation ;  Surgeon: Theodoro Kos, DO;  Location: [place];  Service: Plastics;  Laterality: N/A;  . PARACENTESIS    . PERIPHERAL ARTERIAL STENT GRAFT Left      Prior to Admission medications   Medication Sig Start Date End Date Taking? Authorizing Provider  B Complex-C-Zn-Folic Acid (DIALYVITE/ZINC) TABS Take 1 tablet by mouth daily. 09/08/17   [provider]  budesonide-formoterol (SYMBICORT) 160-4.5 MCG/ACT inhaler Inhale 2 puffs into the lungs daily. 11/16/16   Vaughan Basta, MD  calcium acetate (PHOSLO) 667 MG capsule Take 2,001 mg by mouth 3 (three) times daily.    [provider]  ferrous sulfate 325 (65 FE) MG tablet Take 1 tablet by mouth 3 (three) times daily. 01/21/17   [provider]  folic acid (FOLVITE) 1 MG tablet Take 1 tablet (1 mg total) by mouth daily. 10/31/17   Fritzi Mandes, MD  gabapentin (NEURONTIN) 300 MG capsule Take 300 mg by mouth 2 (two) times daily.  08/16/16   [provider]  ipratropium-albuterol (DUONEB) 0.5-2.5 (3) MG/3ML SOLN Take 3 mLs by nebulization every 6 (six) hours as needed. 11/28/16   Loletha Grayer, MD  labetalol (NORMODYNE) 100 MG tablet Take 100 mg by mouth daily. 03/24/17   [provider]  multivitamin  (RENA-VIT) TABS tablet Take 1 tablet by mouth at bedtime. 10/31/17   Fritzi Mandes, MD  nitroGLYCERIN (NITROSTAT) 0.4 MG SL tablet Place 1 tablet (0.4 mg total) under the tongue every 5 (five) minutes as needed. 04/12/16   Wende Bushy, MD  omeprazole (PRILOSEC) 40 MG capsule Take 40 mg by mouth daily. 09/08/17   [provider]  Spacer/Aero Chamber Mouthpiece MISC 1 Units by Does not apply route every 4 (four) hours as needed (wheezing). 02/19/17   Darel Hong, MD  tiotropium (SPIRIVA HANDIHALER) 18 MCG inhalation capsule Place 1 capsule (18 mcg total) into inhaler and inhale daily. 11/16/16 11/18/17  Vaughan Basta, MD     Allergies Patient has no known allergies.   Family History  Problem Relation Age of Onset  . Colon cancer Mother   . Cancer Father   . Cancer Sister   . Kidney disease Brother     Social History Social History   Tobacco Use  . Smoking status: Current Every Day Smoker    Packs/day: 0.15    Years: 40.00  Pack years: 6.00    Types: Cigarettes  . Smokeless tobacco: Never Used  Substance Use Topics  . Alcohol use: No    Comment: pt reports quitting after learning about cirrhosis  . Drug use: No    Frequency: 7.0 times per week    Types: Marijuana, Cocaine    Review of Systems  Constitutional:   No fever or chills.  ENT:   No sore throat. No rhinorrhea. Cardiovascular:   No chest pain or syncope. Respiratory:   No dyspnea or cough. Gastrointestinal:   Negative for abdominal pain, vomiting and diarrhea.  Musculoskeletal:   Negative for focal pain or swelling All other systems reviewed and are negative except as documented above in ROS and HPI.  ____________________________________________   PHYSICAL EXAM:  VITAL SIGNS: ED Triage Vitals  Enc Vitals Group     BP 12/03/17 2008 129/75     Pulse Rate 12/03/17 2008 65     Resp 12/03/17 2008 14     Temp 12/03/17 2008 97.8 F (36.6 C)     Temp Source 12/03/17 2008 Oral     SpO2  12/03/17 2008 97 %     Weight 12/03/17 2009 180 lb (81.6 kg)     Height 12/03/17 2009 6\' 3"  (1.905 m)     Head Circumference --      Peak Flow --      Pain Score 12/03/17 2009 8     Pain Loc --      Pain Edu? --      Excl. in [place]? --     Vital signs reviewed, nursing assessments reviewed.   Constitutional:   Alert and oriented. Non-toxic appearance. Eyes:   Conjunctivae are normal. EOMI. PERRL. ENT      Head:   Normocephalic and atraumatic.      Nose:   No congestion/rhinnorhea.       Mouth/Throat:   MMM, no pharyngeal erythema. No peritonsillar mass.       Neck:   No meningismus. Full ROM. Hematological/Lymphatic/Immunilogical:   No cervical lymphadenopathy. Cardiovascular:   RRR. Symmetric bilateral radial and DP pulses.  No murmurs. Cap refill less than 2 seconds. Respiratory:   Normal respiratory effort without tachypnea/retractions. Breath sounds are clear and equal bilaterally. No wheezes/rales/rhonchi. Gastrointestinal:   Soft and nontender.  Markedly distended . There is no CVA tenderness.  No rebound, rigidity, or guarding. Musculoskeletal:   Normal range of motion in all extremities. No joint effusions.  No lower extremity tenderness.  No edema. Neurologic:   Normal speech and language.  Motor grossly intact. No acute focal neurologic deficits are appreciated.  Skin:    Skin is warm, dry and intact. No rash noted.  No petechiae, purpura, or bullae.  No abdominal wall erythema or complication from previous procedure sites  ____________________________________________    LABS (pertinent positives/negatives) (all labs ordered are listed, but only abnormal results are displayed) Labs Reviewed  COMPREHENSIVE METABOLIC PANEL - Abnormal; Notable for the following components:      Result Value   Glucose, Bld 134 (*)    BUN 40 (*)    Creatinine, Ser 7.42 (*)    Calcium 8.4 (*)    Total Protein 6.4 (*)    Albumin 3.1 (*)    Alkaline Phosphatase 149 (*)    GFR calc non Af  Amer 7 (*)    GFR calc Af Amer 8 (*)    All other components within normal limits  CBC WITH DIFFERENTIAL/PLATELET - Abnormal; Notable  for the following components:   RBC 3.60 (*)    Hemoglobin 8.9 (*)    HCT 29.2 (*)    MCH 24.7 (*)    MCHC 30.4 (*)    RDW 27.4 (*)    Lymphs Abs 0.6 (*)    All other components within normal limits  LIPASE, BLOOD   ____________________________________________   EKG    ____________________________________________    RADIOLOGY  Dg Chest 2 View  Result Date: 12/03/2017 CLINICAL DATA:  Abdominal distention.  Bibasilar crackles EXAM: CHEST - 2 VIEW COMPARISON:  11/27/2017 FINDINGS: Right dialysis catheter remains in place, unchanged. Cardiomegaly. Prior CABG. Vascular congestion and interstitial prominence throughout the lungs. No effusions or acute bony abnormality. Underlying COPD. IMPRESSION: Cardiomegaly with vascular congestion and probable mild interstitial edema. COPD. Electronically Signed   By: Rolm Baptise M.D.   On: 12/03/2017 20:41    ____________________________________________   PROCEDURES Procedures  ____________________________________________    CLINICAL IMPRESSION / ASSESSMENT AND PLAN / ED COURSE  Pertinent labs & imaging results that were available during my care of the patient were reviewed by me and considered in my medical decision making (see chart for details).    Patient presents with abdominal swelling.  No shortness of breath, exam is unremarkable.  Doubt SBP pneumonia volume overload electrolyte abnormality or soft tissue infection.  Due to his underlying severe comorbidities will check labs and chest x-ray.  Clinical Course as of Dec 03 2249  Sun Dec 03, 2017  2221 Labs overall unremarkable, at baseline.  Chest x-ray unremarkable.  Patient sleeping comfortably on his side lying flat, normal work of breathing, normal breath sounds, oxygenation 100% on room air.  Stable for discharge home.  Recommend outpatient  follow-up tomorrow with GI clinic to arrange routine paracentesis..   [PS]    Clinical Course User Index [PS] Carrie Mew, MD     ____________________________________________   FINAL CLINICAL IMPRESSION(S) / ED DIAGNOSES    Final diagnoses:  [name] ([place])  Alcoholic cirrhosis of liver with ascites [facility])     ED Discharge Orders    None      Portions of this note were generated with dragon dictation software. Dictation errors may occur despite best attempts at proofreading.    Carrie Mew, MD 12/03/17 2253

## 2017-12-04 IMAGING — DX DG CHEST 1V
1 series · 1 of 1 positions shown · non-contrast
Comparison: 04/05/2015.

CLINICAL DATA: Cough.  Smoker.

EXAM:
CHEST 1 VIEW

[chest ap]
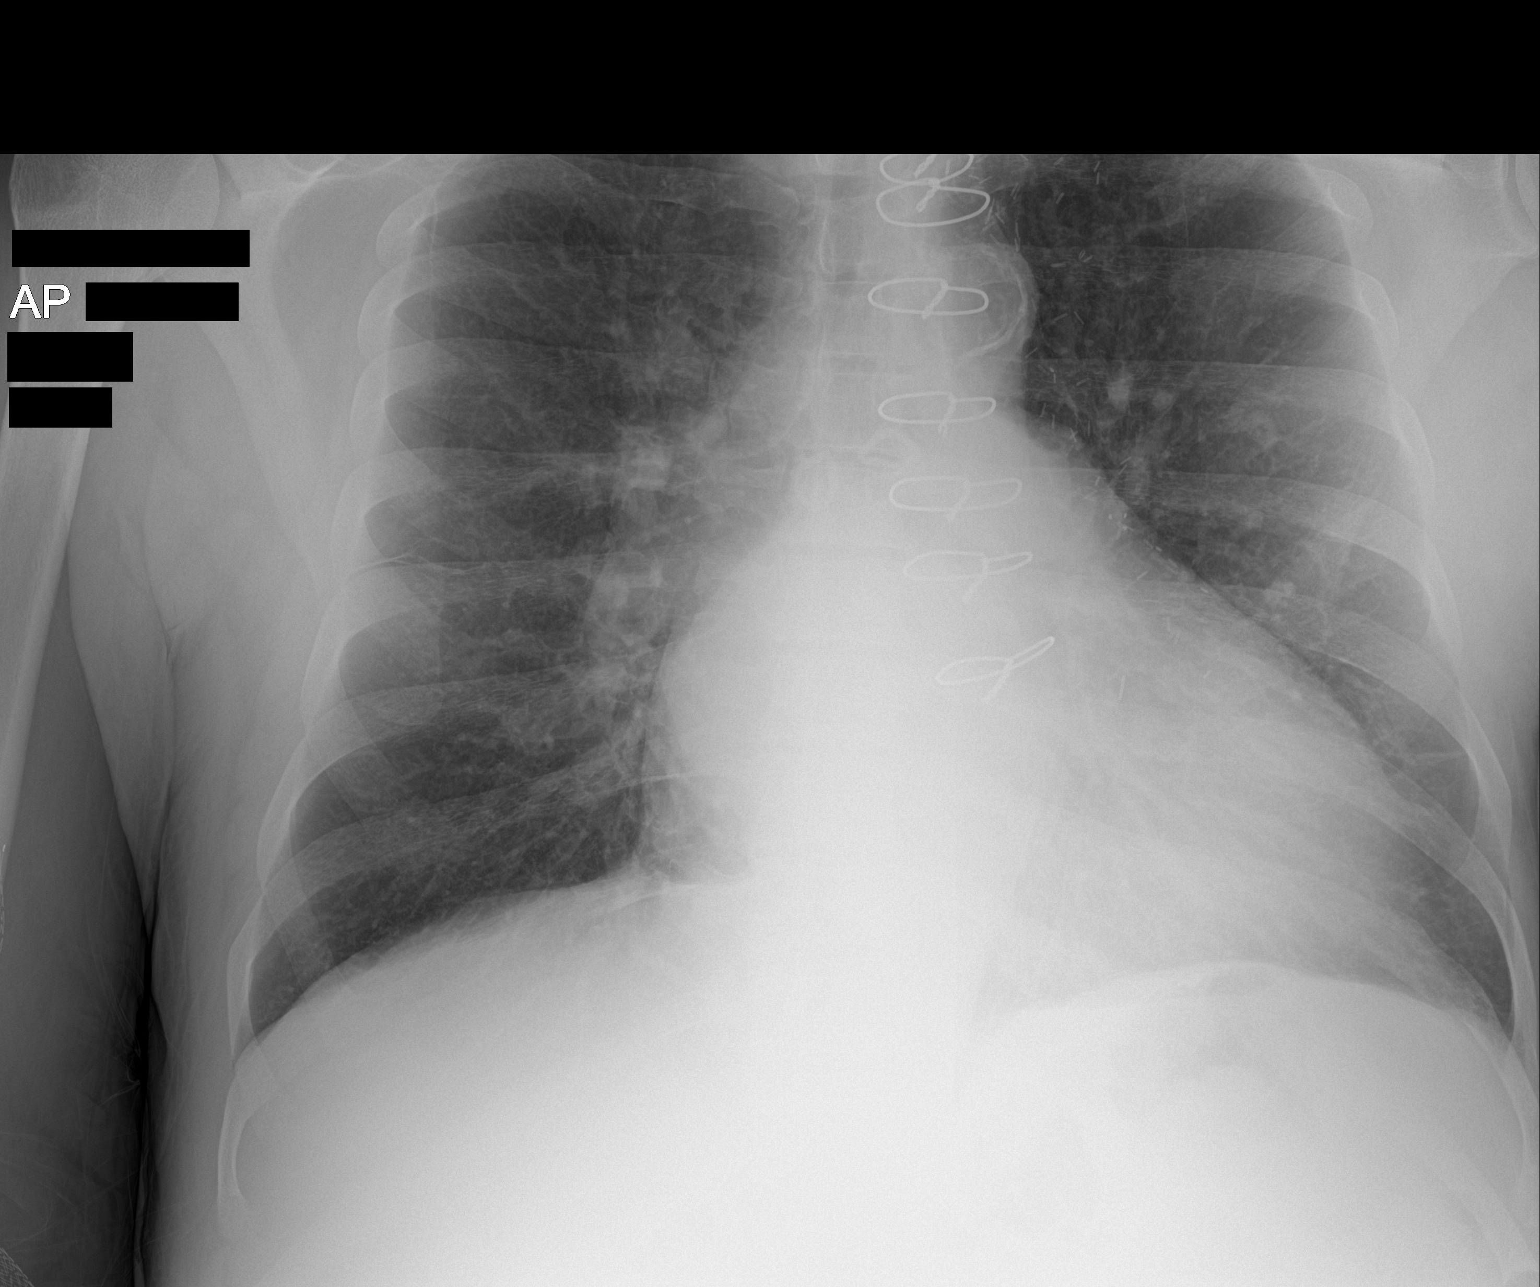

[1 of 1 positions shown; findings below may reference images not displayed]

FINDINGS: Stable enlarged cardiac silhouette and post CABG changes. Mild
linear scarring in the left lower lung zone. Otherwise, clear lungs
with normal vascularity. No pleural fluid. Unremarkable bones.
IMPRESSION: No acute abnormality.  Stable cardiomegaly.

## 2017-12-05 DIAGNOSIS — D509 Iron deficiency anemia, unspecified: Secondary | ICD-10-CM | POA: Diagnosis not present

## 2017-12-05 DIAGNOSIS — Z992 Dependence on renal dialysis: Secondary | ICD-10-CM | POA: Diagnosis not present

## 2017-12-05 DIAGNOSIS — N2581 Secondary hyperparathyroidism of renal origin: Secondary | ICD-10-CM | POA: Diagnosis not present

## 2017-12-05 DIAGNOSIS — D631 Anemia in chronic kidney disease: Secondary | ICD-10-CM | POA: Diagnosis not present

## 2017-12-05 DIAGNOSIS — N186 End stage renal disease: Secondary | ICD-10-CM | POA: Diagnosis not present

## 2017-12-05 IMAGING — CR DG CHEST 2V
1 series · 4 of 4 positions shown · non-contrast
Comparison: 05/03/2015

CLINICAL DATA: Fever. Weakness. Fluid volume excess. End-stage
renal disease on dialysis. Coronary artery disease

EXAM:
CHEST  2 VIEW

[Series 1: dg chest 2 view · 0.14mm/px · 4 of 4 slices shown]
[im 1/4]
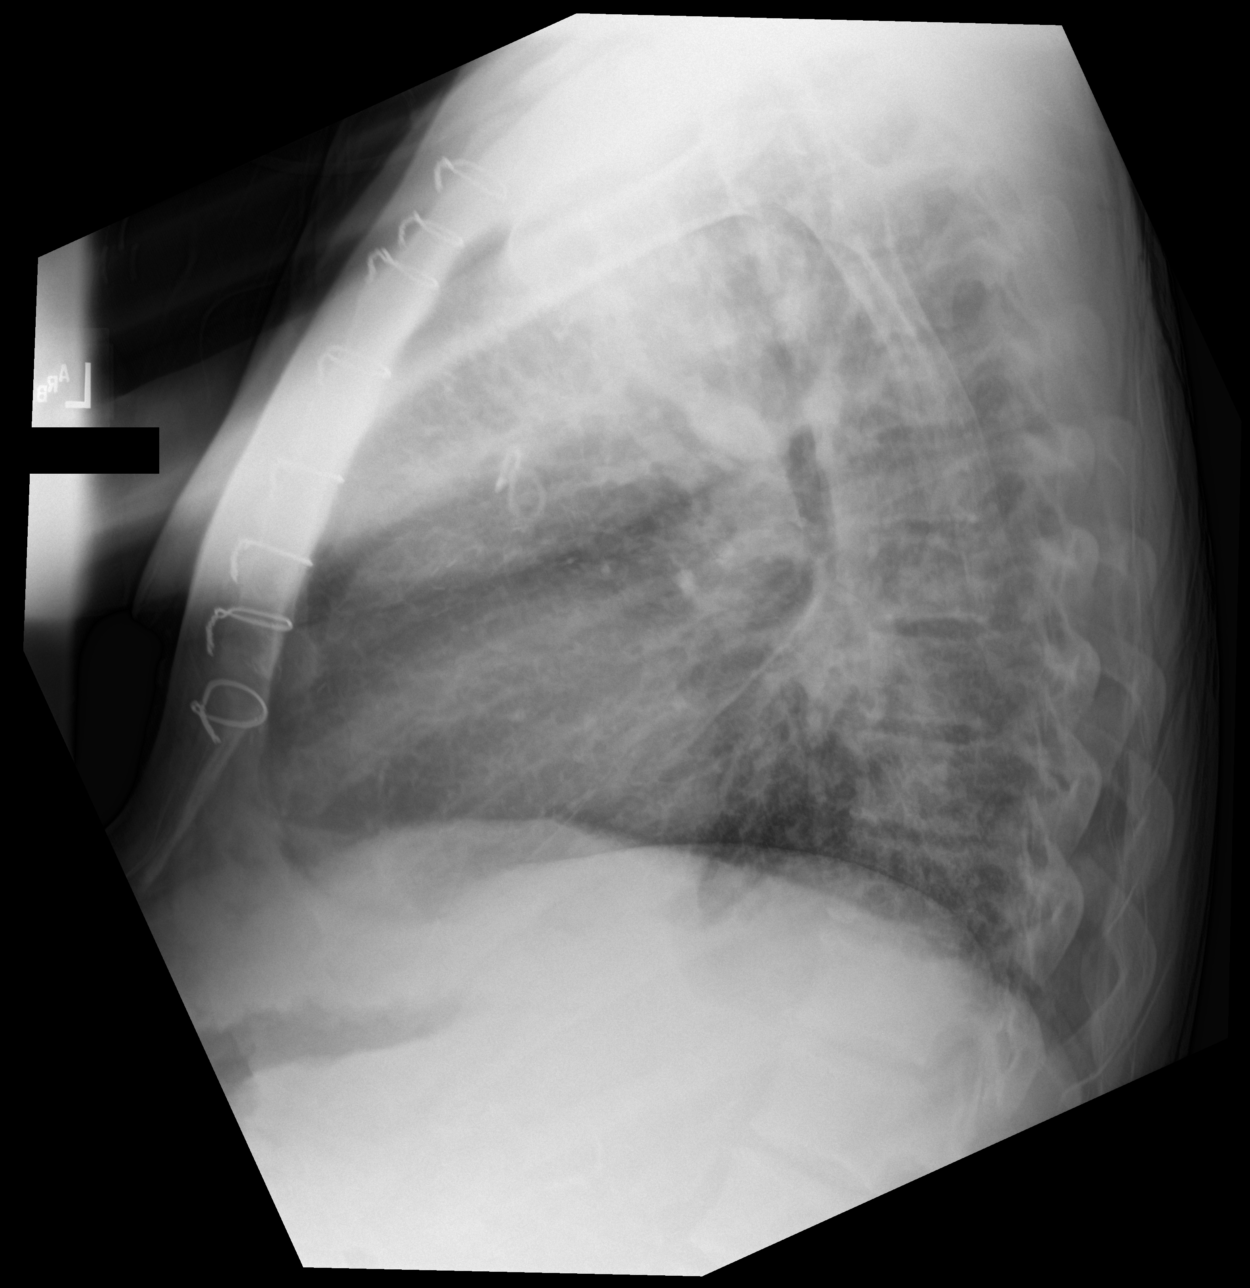
[im 2/4]
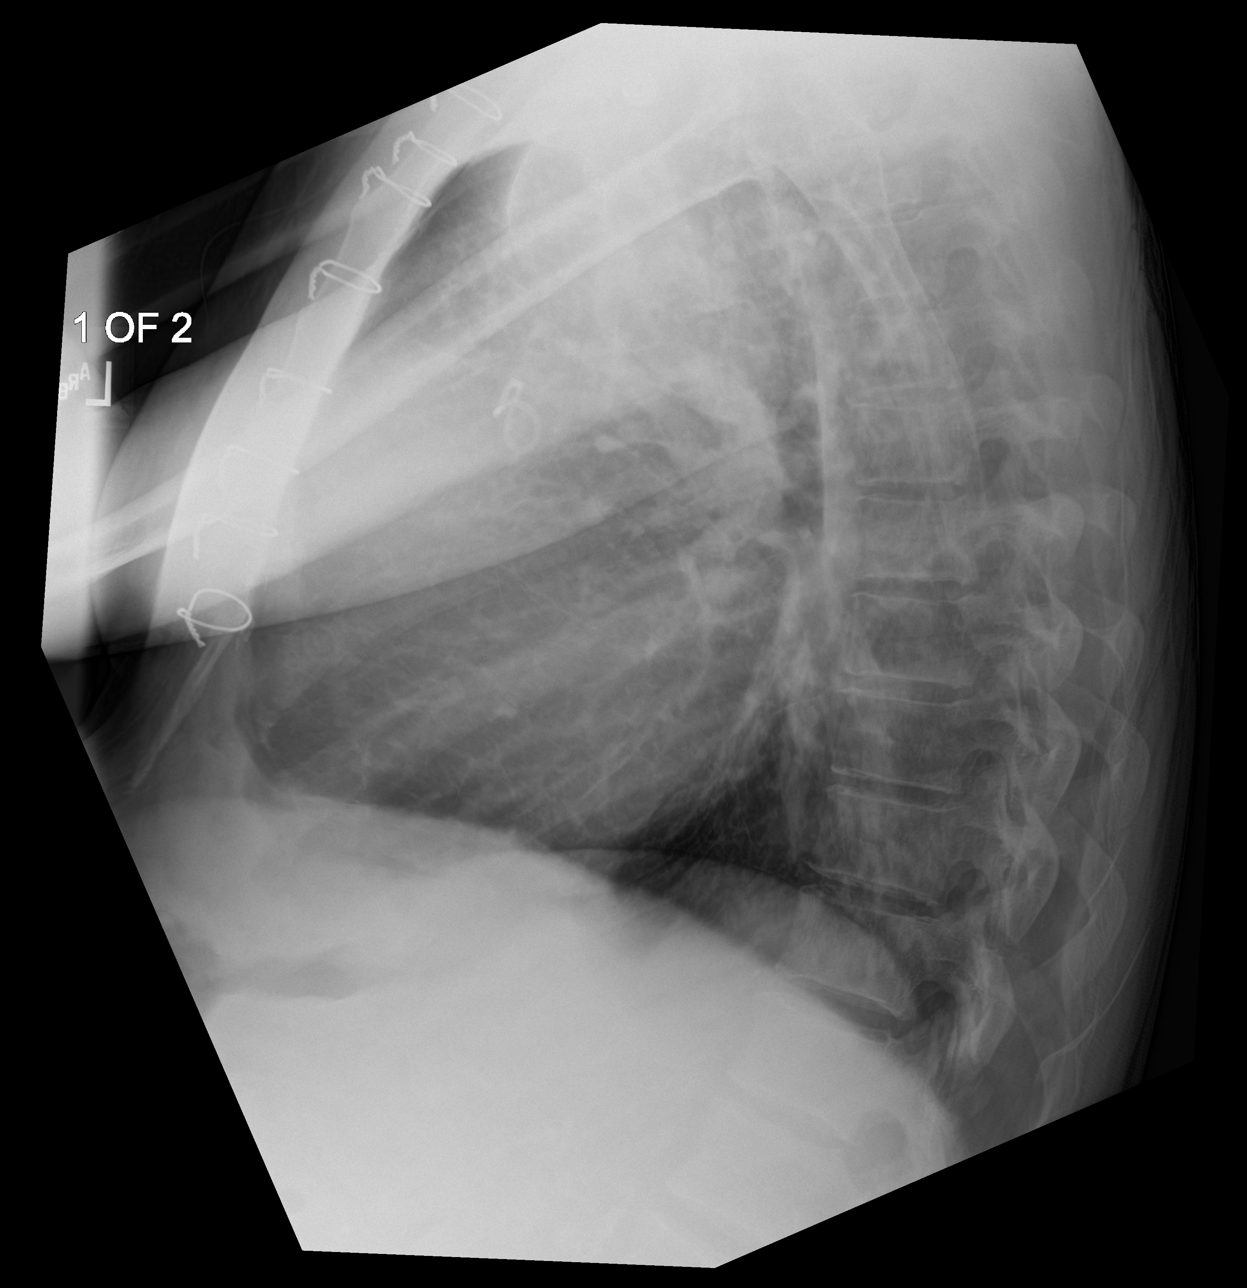
[im 3/4]
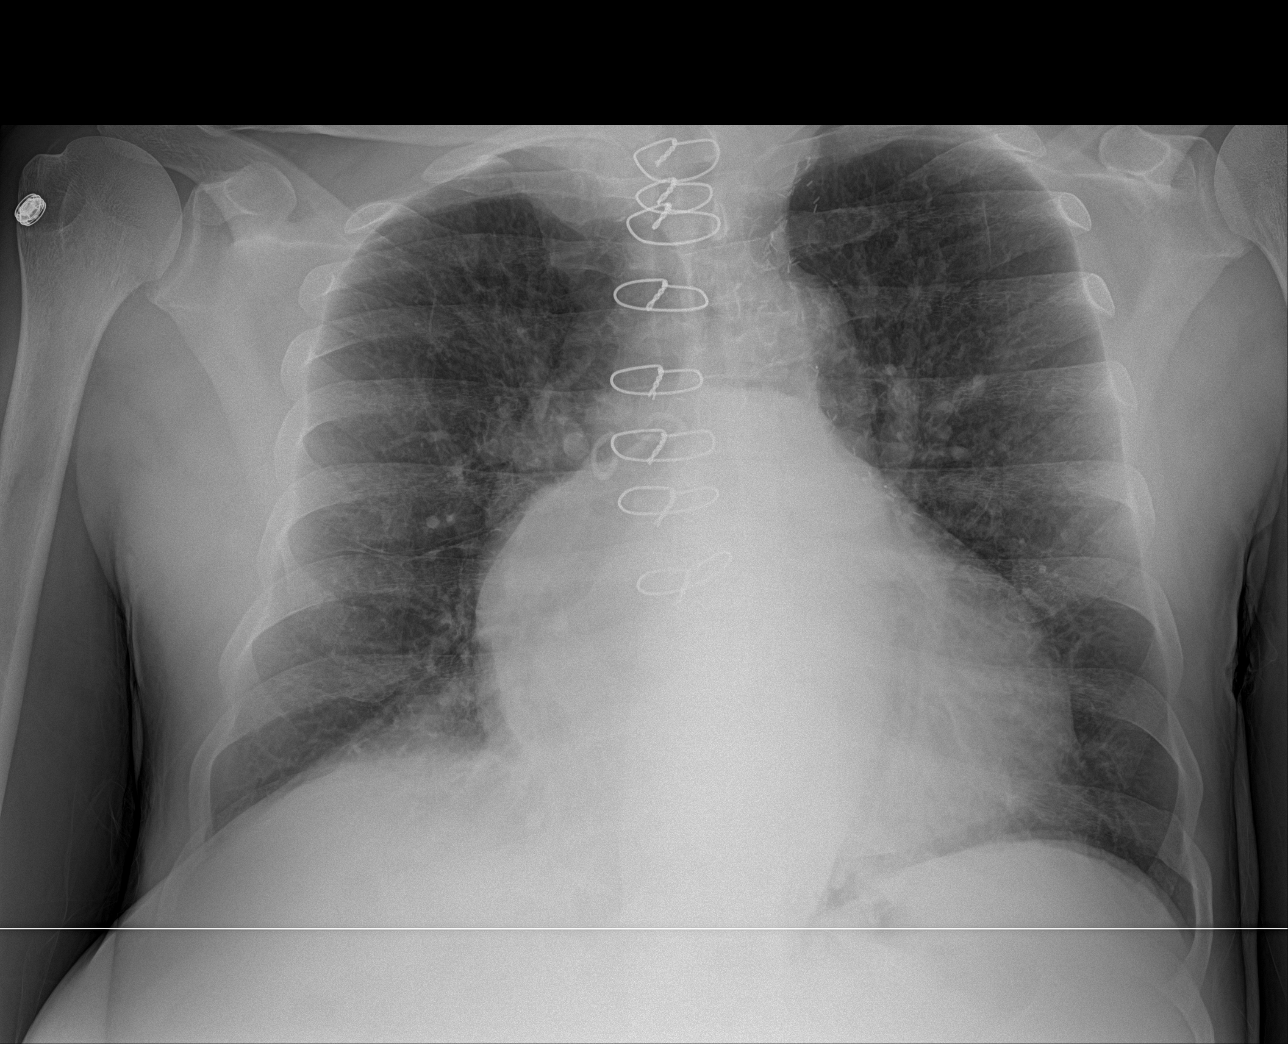
[im 4/4]
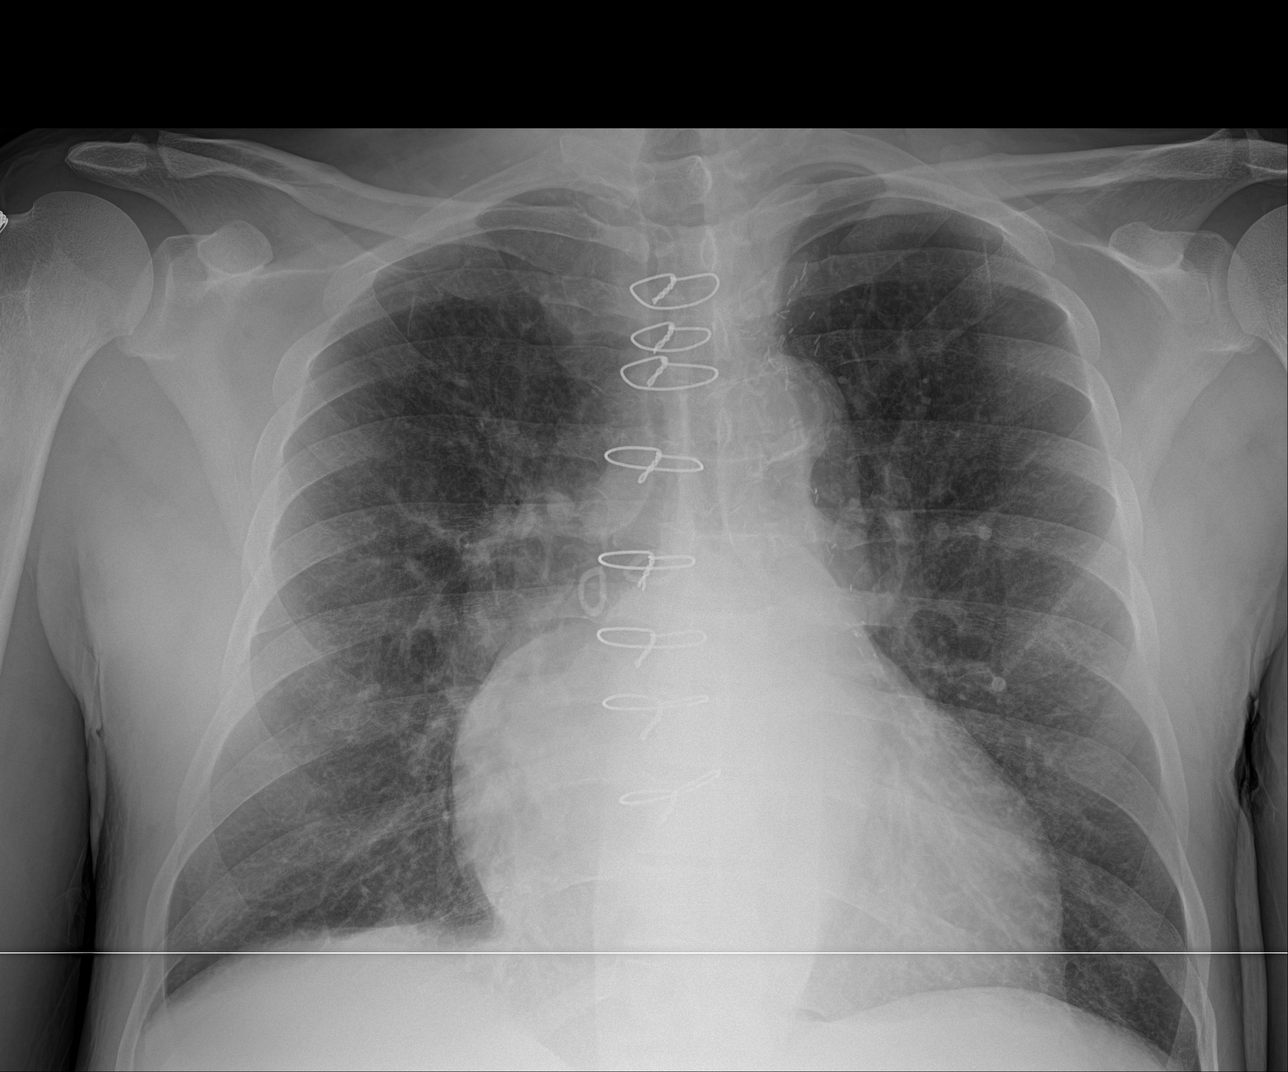

[4 of 4 positions shown; findings below may reference images not displayed]

FINDINGS: Moderate cardiomegaly remains stable. No evidence of acute
infiltrate or edema. No evidence pleural effusion. Prior CABG again
noted.
IMPRESSION: Stable cardiomegaly.  No active lung disease.

## 2017-12-06 ENCOUNTER — Emergency Department: Payer: Medicare Other

## 2017-12-06 ENCOUNTER — Other Ambulatory Visit: Payer: Self-pay

## 2017-12-06 ENCOUNTER — Emergency Department
Admission: EM | Admit: 2017-12-06 | Discharge: 2017-12-07 | Disposition: A | Discharge status: Home / Self Care | Payer: Medicare Other | Source: Home / Self Care | Attending: Emergency Medicine | Admitting: Emergency Medicine

## 2017-12-06 ENCOUNTER — Encounter: Payer: Self-pay | Admitting: Emergency Medicine

## 2017-12-06 DIAGNOSIS — R188 Other ascites: Secondary | ICD-10-CM | POA: Diagnosis not present

## 2017-12-06 DIAGNOSIS — Z9889 Other specified postprocedural states: Secondary | ICD-10-CM

## 2017-12-06 DIAGNOSIS — R1907 Generalized intra-abdominal and pelvic swelling, mass and lump: Secondary | ICD-10-CM

## 2017-12-06 DIAGNOSIS — J449 Chronic obstructive pulmonary disease, unspecified: Secondary | ICD-10-CM

## 2017-12-06 DIAGNOSIS — E8770 Fluid overload, unspecified: Secondary | ICD-10-CM | POA: Diagnosis not present

## 2017-12-06 DIAGNOSIS — I2510 Atherosclerotic heart disease of native coronary artery without angina pectoris: Secondary | ICD-10-CM

## 2017-12-06 DIAGNOSIS — K7460 Unspecified cirrhosis of liver: Secondary | ICD-10-CM

## 2017-12-06 DIAGNOSIS — R079 Chest pain, unspecified: Secondary | ICD-10-CM | POA: Diagnosis not present

## 2017-12-06 DIAGNOSIS — F17210 Nicotine dependence, cigarettes, uncomplicated: Secondary | ICD-10-CM | POA: Insufficient documentation

## 2017-12-06 DIAGNOSIS — E875 Hyperkalemia: Secondary | ICD-10-CM | POA: Diagnosis not present

## 2017-12-06 DIAGNOSIS — Z992 Dependence on renal dialysis: Secondary | ICD-10-CM | POA: Insufficient documentation

## 2017-12-06 DIAGNOSIS — I132 Hypertensive heart and chronic kidney disease with heart failure and with stage 5 chronic kidney disease, or end stage renal disease: Secondary | ICD-10-CM

## 2017-12-06 DIAGNOSIS — R1900 Intra-abdominal and pelvic swelling, mass and lump, unspecified site: Secondary | ICD-10-CM

## 2017-12-06 DIAGNOSIS — I509 Heart failure, unspecified: Secondary | ICD-10-CM | POA: Insufficient documentation

## 2017-12-06 DIAGNOSIS — J810 Acute pulmonary edema: Secondary | ICD-10-CM | POA: Diagnosis not present

## 2017-12-06 DIAGNOSIS — Z79899 Other long term (current) drug therapy: Secondary | ICD-10-CM | POA: Insufficient documentation

## 2017-12-06 DIAGNOSIS — R222 Localized swelling, mass and lump, trunk: Secondary | ICD-10-CM | POA: Diagnosis not present

## 2017-12-06 DIAGNOSIS — N186 End stage renal disease: Secondary | ICD-10-CM

## 2017-12-06 DIAGNOSIS — I10 Essential (primary) hypertension: Secondary | ICD-10-CM | POA: Diagnosis not present

## 2017-12-06 DIAGNOSIS — R0602 Shortness of breath: Secondary | ICD-10-CM | POA: Diagnosis not present

## 2017-12-06 DIAGNOSIS — R609 Edema, unspecified: Secondary | ICD-10-CM | POA: Diagnosis not present

## 2017-12-06 LAB — COMPREHENSIVE METABOLIC PANEL
ALK PHOS: 137 U/L — AB (ref 38–126)
ALT: 18 U/L (ref 0–44)
AST: 32 U/L (ref 15–41)
Albumin: 3 g/dL — ABNORMAL LOW (ref 3.5–5.0)
Anion gap: 12 (ref 5–15)
BILIRUBIN TOTAL: 0.7 mg/dL (ref 0.3–1.2)
BUN: 46 mg/dL — ABNORMAL HIGH (ref 6–20)
CALCIUM: 8.8 mg/dL — AB (ref 8.9–10.3)
CO2: 23 mmol/L (ref 22–32)
CREATININE: 8.48 mg/dL — AB (ref 0.61–1.24)
Chloride: 106 mmol/L (ref 98–111)
GFR calc non Af Amer: 6 mL/min — ABNORMAL LOW (ref >60)
GFR, EST AFRICAN AMERICAN: 7 mL/min — AB (ref >60)
Glucose, Bld: 94 mg/dL (ref 70–99)
Potassium: 5.2 mmol/L — ABNORMAL HIGH (ref 3.5–5.1)
SODIUM: 141 mmol/L (ref 135–145)
Total Protein: 6.7 g/dL (ref 6.5–8.1)

## 2017-12-06 LAB — CBC
HEMATOCRIT: 29.1 % — AB (ref 40.0–52.0)
HEMOGLOBIN: 9 g/dL — AB (ref 13.0–18.0)
MCH: 25.1 pg — AB (ref 26.0–34.0)
MCHC: 31 g/dL — ABNORMAL LOW (ref 32.0–36.0)
MCV: 80.9 fL (ref 80.0–100.0)
Platelets: 238 10*3/uL (ref 150–440)
RBC: 3.59 MIL/uL — AB (ref 4.40–5.90)
RDW: 26.2 % — ABNORMAL HIGH (ref 11.5–14.5)
WBC: 5.8 10*3/uL (ref 3.8–10.6)

## 2017-12-06 LAB — PROTIME-INR
INR: 1.1
Prothrombin Time: 14.1 seconds (ref 11.4–15.2)

## 2017-12-06 MED ORDER — IPRATROPIUM-ALBUTEROL 0.5-2.5 (3) MG/3ML IN SOLN
3.0000 mL | Freq: Once | RESPIRATORY_TRACT | Status: AC
  Administered 2017-12-06: 3 mL via RESPIRATORY_TRACT
  Filled 2017-12-06: qty 3

## 2017-12-06 MED ORDER — DIPHENHYDRAMINE HCL 25 MG PO CAPS
ORAL_CAPSULE | ORAL | Status: AC
  Administered 2017-12-07: 50 mg via ORAL
  Filled 2017-12-06: qty 2

## 2017-12-06 NOTE — ED Notes (Signed)
MD stated that pt need to be placed on NPO status at midnight. Per MD pt can have [place] sandwich now.

## 2017-12-06 NOTE — ED Notes (Signed)
Pt placed on NPO status.

## 2017-12-06 NOTE — ED Provider Notes (Addendum)
[facility] Emergency Department Provider Note  Time seen: 6:11 PM  I have reviewed the triage vital signs and the nursing notes.   HISTORY  Chief Complaint Abdominal Swelling    HPI Brainard Highfill is a 57 y.o. male with a past medical history of alcohol abuse CHF, cirrhosis, CAD, end-stage renal disorder on hemodialysis Tuesday, Thursday, Saturday, presents to the emergency department for abdominal swelling.  Patient has known cirrhosis and typically requires a paracentesis every 2 weeks.  Patient had a paracentesis performed 11/28/2017 had 5 L of fluid removed.  Patient states the fluid has reaccumulated and his abdomen is tight which makes him short of breath.  Was seen in the emergency department 12/03/2017 was told to attempt to arrange this as an outpatient, he states he attempted but did not have any success so he comes back to the emergency department hoping for a paracentesis.  Denies any fever, nausea or vomiting.  No diarrhea.  Received a full dialysis session yesterday.   Past Medical History:  Diagnosis Date  . Alcohol abuse   . CHF (congestive heart failure) ([place])   . Cirrhosis ([place])   . Coronary artery disease 2009  . Drug abuse ([place])   . End stage renal disease on dialysis [facility]) NEPHROLOGIST-   DR [facility]  IN [place]   HEMODIALYSIS --   TUES/  THURS/  SAT  . Gastrointestinal bleed 06/13/2017   From chart...hx of multiple GI bleeds  . GERD (gastroesophageal reflux disease)   . Hyperlipidemia   . Hypertension   . PAD (peripheral artery disease) ([place])   . Renal insufficiency    Per pt, 32 oz fluid restriction per day  . S/P triple vessel bypass 06/09/2016   2009ish  . Suicidal ideation    & HOMICIDAL IDEATION --  06-16-2013   ADMITTED TO BEHAVIOR HEALTH    Patient Active Problem List   Diagnosis Date Noted  . Acute respiratory failure ([place]) 10/29/2017  . ESRD (end stage renal disease) on dialysis ([place]) 07/28/2017  . Protein-calorie  malnutrition, severe 06/14/2017  . Encounter for dialysis [facility])   . Palliative care by specialist   . Goals of care, counseling/discussion   . Malnutrition of moderate degree 06/05/2017  . Secondary esophageal varices without bleeding ([place])   . Stomach irritation   . Idiopathic esophageal varices without bleeding ([place])   . Alcoholic hepatitis with ascites 05/24/2017  . ESRD (end stage renal disease) ([place]) 04/28/2017  . Uremia 03/08/2017  . ESRD on hemodialysis ([place]) 03/03/2017  . Weakness 02/28/2017  . Hypocalcemia 02/22/2017  . Shortness of breath 11/26/2016  . COPD (chronic obstructive pulmonary disease) ([place]) 10/30/2016  . COPD exacerbation ([place]) 10/29/2016  . Anemia   . Heme positive stool   . Ulceration of intestine   . Benign neoplasm of transverse colon   . Acute gastrointestinal hemorrhage   . Esophageal candidiasis ([place])   . Angiodysplasia of intestinal tract   . Acute respiratory failure with hypoxia ([place]) 07/03/2016  . GI bleeding 06/24/2016  . Rectal bleeding 06/14/2016  . Anemia of chronic disease 06/01/2016  . MRSA carrier 06/01/2016  . Chronic renal failure 05/23/2016  . Ischemic heart disease 05/23/2016  . Angiodysplasia of small intestine   . Melena   . Small bowel bleed not requiring more than 4 units of blood in 24 hours, ICU, or surgery   . Anemia due to chronic blood loss   . Abdominal pain 05/05/2016  . Acute posthemorrhagic anemia 04/17/2016  .  Gastrointestinal bleed 04/17/2016  . History of esophagogastroduodenoscopy (EGD) 04/17/2016  . Elevated troponin 04/17/2016  . Alcohol abuse 04/17/2016  . Upper GI bleed 01/19/2016  . Blood in stool   . Angiodysplasia of stomach and duodenum with hemorrhage   . Gastritis   . Reflux esophagitis   . GI bleed 05/16/2015  . Acute GI bleeding   . Symptomatic anemia 04/30/2015  . HTN (hypertension) 04/06/2015  . GERD (gastroesophageal reflux disease) 04/06/2015  . HLD (hyperlipidemia) 04/06/2015  . Dyspnea  04/06/2015  . Cirrhosis of liver with ascites ([place]) 04/06/2015  . Ascites 04/06/2015  . GIB (gastrointestinal bleeding) 03/23/2015  . Homicidal ideation 06/19/2013  . Suicidal intent 06/19/2013  . Homicidal ideations 06/19/2013  . Hyperkalemia 06/16/2013  . Mandible fracture ([place]) 06/05/2013  . Fracture, mandible ([place]) 06/02/2013  . Coronary atherosclerosis of native coronary artery 06/02/2013  . ESRD on dialysis ([place]) 06/02/2013  . Mandible open fracture ([place]) 06/02/2013    Past Surgical History:  Procedure Laterality Date  . A/V FISTULAGRAM Right 06/06/2017   Procedure: A/V FISTULAGRAM;  Surgeon: Katha Cabal, MD;  Location: [place] [facility];  Service: Cardiovascular;  Laterality: Right;  . A/V SHUNT INTERVENTION N/A 06/06/2017   Procedure: A/V SHUNT INTERVENTION;  Surgeon: Katha Cabal, MD;  Location: [place] [facility];  Service: Cardiovascular;  Laterality: N/A;  . AGILE CAPSULE N/A 06/19/2016   Procedure: AGILE CAPSULE;  Surgeon: [name], MD;  Location: [facility] ENDOSCOPY;  Service: Endoscopy;  Laterality: N/A;  . COLONOSCOPY WITH PROPOFOL N/A 06/18/2016   Procedure: COLONOSCOPY WITH PROPOFOL;  Surgeon: [name], MD;  Location: [facility] ENDOSCOPY;  Service: Endoscopy;  Laterality: N/A;  . COLONOSCOPY WITH PROPOFOL N/A 08/12/2016   Procedure: COLONOSCOPY WITH PROPOFOL;  Surgeon: [name], MD;  Location[facility] ENDOSCOPY;  Service: Endoscopy;  Laterality: N/A;  . COLONOSCOPY WITH PROPOFOL N/A 05/05/2017   Procedure: COLONOSCOPY WITH PROPOFOL;  Surgeon: Manya Silvas, MD;  Location: [facility] ENDOSCOPY;  Service: Endoscopy;  Laterality: N/A;  . CORONARY ANGIOPLASTY  ?   PT UNABLE TO TELL IF  BEFORE OR AFTER  CABG  . CORONARY ARTERY BYPASS GRAFT  2008  ([place] , [place])   3 VESSEL  . DIALYSIS FISTULA CREATION  LAST SURGERY  APPOX  2008  . ENTEROSCOPY N/A 05/10/2016   Procedure: ENTEROSCOPY;  Surgeon: Jerene Bears, MD;  Location: [place];  Service: Gastroenterology;   Laterality: N/A;  . ENTEROSCOPY N/A 08/12/2016   Procedure: ENTEROSCOPY;  Surgeon: [name], MD;  Location: [facility] ENDOSCOPY;  Service: Endoscopy;  Laterality: N/A;  . ENTEROSCOPY Left 06/03/2017   Procedure: ENTEROSCOPY;  Surgeon: Virgel Manifold, MD;  Location: [facility] ENDOSCOPY;  Service: Endoscopy;  Laterality: Left;  Procedure date will ultimately depend on when patient is medically optimized before the procedure, pending hemodialysis and blood transfusions etc. Will place on schedule and change depending on clinical status.   . ENTEROSCOPY N/A 06/05/2017   Procedure: ENTEROSCOPY;  Surgeon: Virgel Manifold, MD;  Location: [facility] ENDOSCOPY;  Service: Endoscopy;  Laterality: N/A;  . ENTEROSCOPY N/A 06/15/2017   Procedure: Push ENTEROSCOPY;  Surgeon: [name], MD;  Location: [facility] ENDOSCOPY;  Service: Endoscopy;  Laterality: N/A;  . ESOPHAGOGASTRODUODENOSCOPY N/A 05/07/2015   Procedure: ESOPHAGOGASTRODUODENOSCOPY (EGD);  Surgeon: Hulen Luster, MD;  Location: [facility] ENDOSCOPY;  Service: Endoscopy;  Laterality: N/A;  . ESOPHAGOGASTRODUODENOSCOPY (EGD) WITH PROPOFOL N/A 05/17/2015   Procedure: ESOPHAGOGASTRODUODENOSCOPY (EGD) WITH PROPOFOL;  Surgeon: [name], MD;  Location: [facility] ENDOSCOPY;  Service: Endoscopy;  Laterality: N/A;  . ESOPHAGOGASTRODUODENOSCOPY (EGD) WITH PROPOFOL N/A 01/20/2016   Procedure: ESOPHAGOGASTRODUODENOSCOPY (EGD) WITH PROPOFOL;  Surgeon: [name], MD;  Location: [facility] ENDOSCOPY;  Service: Endoscopy;  Laterality: N/A;  . ESOPHAGOGASTRODUODENOSCOPY (EGD) WITH PROPOFOL N/A 04/17/2016   Procedure: ESOPHAGOGASTRODUODENOSCOPY (EGD) WITH PROPOFOL;  Surgeon: Lin Landsman, MD;  Location: [facility] ENDOSCOPY;  Service: Gastroenterology;  Laterality: N/A;  . ESOPHAGOGASTRODUODENOSCOPY (EGD) WITH PROPOFOL  05/09/2016   Procedure: ESOPHAGOGASTRODUODENOSCOPY (EGD) WITH PROPOFOL;  Surgeon: Jerene Bears, MD;  Location: [place];  Service: Endoscopy;;  . ESOPHAGOGASTRODUODENOSCOPY (EGD)  WITH PROPOFOL N/A 06/16/2016   Procedure: ESOPHAGOGASTRODUODENOSCOPY (EGD) WITH PROPOFOL;  Surgeon: [name], MD;  Location: [facility] ENDOSCOPY;  Service: Endoscopy;  Laterality: N/A;  . ESOPHAGOGASTRODUODENOSCOPY (EGD) WITH PROPOFOL N/A 05/05/2017   Procedure: ESOPHAGOGASTRODUODENOSCOPY (EGD) WITH PROPOFOL;  Surgeon: Manya Silvas, MD;  Location: [facility] ENDOSCOPY;  Service: Endoscopy;  Laterality: N/A;  . ESOPHAGOGASTRODUODENOSCOPY (EGD) WITH PROPOFOL N/A 06/15/2017   Procedure: ESOPHAGOGASTRODUODENOSCOPY (EGD) WITH PROPOFOL;  Surgeon: [name], MD;  Location: [facility] ENDOSCOPY;  Service: Endoscopy;  Laterality: N/A;  . GIVENS CAPSULE STUDY N/A 05/07/2016   Procedure: GIVENS CAPSULE STUDY;  Surgeon: Doran Stabler, MD;  Location: [place];  Service: Endoscopy;  Laterality: N/A;  . MANDIBULAR HARDWARE REMOVAL N/A 07/29/2013   Procedure: REMOVAL OF ARCH BARS;  Surgeon: Theodoro Kos, DO;  Location: [place];  Service: Plastics;  Laterality: N/A;  . ORIF MANDIBULAR FRACTURE N/A 06/05/2013   Procedure: REPAIR OF MANDIBULAR FRACTURE x 2 with maxillo-mandibular fixation ;  Surgeon: Theodoro Kos, DO;  Location: [place];  Service: Plastics;  Laterality: N/A;  . PARACENTESIS    . PERIPHERAL ARTERIAL STENT GRAFT Left     Prior to Admission medications   Medication Sig Start Date End Date Taking? Authorizing Provider  B Complex-C-Zn-Folic Acid (DIALYVITE/ZINC) TABS Take 1 tablet by mouth daily. 09/08/17   [provider]  budesonide-formoterol (SYMBICORT) 160-4.5 MCG/ACT inhaler Inhale 2 puffs into the lungs daily. 11/16/16   Vaughan Basta, MD  calcium acetate (PHOSLO) 667 MG capsule Take 2,001 mg by mouth 3 (three) times daily.    [provider]  ferrous sulfate 325 (65 FE) MG tablet Take 1 tablet by mouth 3 (three) times daily. 01/21/17   [provider]  folic acid (FOLVITE) 1 MG tablet Take 1 tablet (1 mg total) by mouth daily. 10/31/17   Fritzi Mandes, MD  gabapentin (NEURONTIN) 300 MG capsule Take 300 mg by mouth 2 (two) times daily.  08/16/16   [provider]  ipratropium-albuterol (DUONEB) 0.5-2.5 (3) MG/3ML SOLN Take 3 mLs by nebulization every 6 (six) hours as needed. 11/28/16   Loletha Grayer, MD  labetalol (NORMODYNE) 100 MG tablet Take 100 mg by mouth daily. 03/24/17   [provider]  multivitamin (RENA-VIT) TABS tablet Take 1 tablet by mouth at bedtime. 10/31/17   Fritzi Mandes, MD  nitroGLYCERIN (NITROSTAT) 0.4 MG SL tablet Place 1 tablet (0.4 mg total) under the tongue every 5 (five) minutes as needed. 04/12/16   Wende Bushy, MD  omeprazole (PRILOSEC) 40 MG capsule Take 40 mg by mouth daily. 09/08/17   [provider]  Spacer/Aero Chamber Mouthpiece MISC 1 Units by Does not apply route every 4 (four) hours as needed (wheezing). 02/19/17   Darel Hong, MD  tiotropium (SPIRIVA HANDIHALER) 18 MCG inhalation capsule Place 1 capsule (18 mcg total) into inhaler and inhale daily. 11/16/16 11/18/17  Vaughan Basta, MD    No Known Allergies  Family History  Problem Relation Age of Onset  . Colon cancer Mother   . Cancer Father   . Cancer Sister   . Kidney disease Brother     Social History Social History   Tobacco Use  . Smoking status: Current Every Day Smoker    Packs/day: 0.15    Years: 40.00    Pack years: 6.00    Types: Cigarettes  . Smokeless tobacco: Never Used  Substance Use Topics  . Alcohol use: No    Comment: pt reports quitting after learning about cirrhosis  . Drug use: No    Frequency: 7.0 times per week    Types: Marijuana, Cocaine    Review of Systems Constitutional: Negative for fever. Cardiovascular: Negative for chest pain. Respiratory: Mild shortness of breath Gastrointestinal: Abdominal swelling. Genitourinary: Negative for urinary compaints Musculoskeletal: Negative for musculoskeletal complaints Skin: Negative for skin complaints  Neurological: Negative for  headache All other ROS negative  ____________________________________________   PHYSICAL EXAM:  VITAL SIGNS: ED Triage Vitals  Enc Vitals Group     BP 12/06/17 1757 125/86     Pulse Rate 12/06/17 1757 79     Resp 12/06/17 1757 20     Temp 12/06/17 1757 97.7 F (36.5 C)     Temp Source 12/06/17 1757 Oral     SpO2 12/06/17 1757 95 %     Weight 12/06/17 1754 180 lb (81.6 kg)     Height 12/06/17 1754 6\' 3"  (1.905 m)     Head Circumference --      Peak Flow --      Pain Score 12/06/17 1754 6     Pain Loc --      Pain Edu? --      Excl. in [place]? --     Constitutional: Alert and oriented. Well appearing and in no distress. Eyes: Normal exam ENT   Head: Normocephalic and atraumatic.   Mouth/Throat: Mucous membranes are moist. Cardiovascular: Normal rate, regular rhythm. Respiratory: Normal respiratory effort without tachypnea nor retractions. Breath sounds are clear Gastrointestinal: Patient has a distended abdomen that is largely nontender, dull to percussion most consistent with ascites.  It is quite tense. Musculoskeletal: Nontender with normal range of motion in all extremities.  Neurologic:  Normal speech and language. No gross focal neurologic deficits  Skin:  Skin is warm, dry and intact.  Psychiatric: Mood and affect are normal.   ____________________________________________  EKG reviewed and interpreted by myself shows normal sinus rhythm 80 bpm with a widened QRS, normal axis, largely normal intervals nonspecific ST changes.  RADIOLOGY  Chest x-ray shows cardiomegaly mild interstitial edema  ____________________________________________   INITIAL IMPRESSION / ASSESSMENT AND PLAN / ED COURSE  Pertinent labs & imaging results that were available during my care of the patient were reviewed by me and considered in my medical decision making (see chart for details).  Patient presents the emergency department for abdominal swelling and distention.  No  significant tenderness on abdominal examination.  Abdomen is quite distended however and tight.  We will check labs, chest x-ray and continue to closely monitor.  First and we have no ability to do an ultrasound-guided paracentesis through interventional radiology at this time patient will likely require staying in the emergency department overnight versus going home and coming back.  Patient states he cannot go home with his abdomen this swollen he wants to stay until he gets the paracentesis.   X-ray shows mild interstitial edema.  Labs are largely at baseline.  Discussed once again with patient follow-up with his doctor for paracentesis, he has already been here once over the past 3 days for the same, states he cannot go home with his abdomen the swollen as he feels short of breath.  We will keep in the emergency department overnight and have an ultrasound-guided paracentesis performed tomorrow.  I discussed the patient with interventional radiology Dr. Pascal Lux, they will perform the procedure tomorrow.  We will place patient n.p.o. after midnight tonight.  We will hold in the emergency department to the patient can have his paracentesis performed. ____________________________________________   FINAL CLINICAL IMPRESSION(S) / ED DIAGNOSES  Abdominal swelling    Harvest Dark, MD 12/06/17 Charna Elizabeth, MD 12/06/17 2052

## 2017-12-06 NOTE — ED Triage Notes (Signed)
Pt presents to ED via [facility] with c/o abdominal swelling. Pt was seen 9/22 for same. Pt states swelling since paracentesis on Monday 9/21. Pt is A&O x 4, NAD Noted at this time.

## 2017-12-07 ENCOUNTER — Emergency Department
Admit: 2017-12-07 | Discharge: 2017-12-07 | Disposition: A | Discharge status: Home / Self Care | Payer: Medicare Other | Attending: Nephrology | Admitting: Nephrology

## 2017-12-07 DIAGNOSIS — R188 Other ascites: Secondary | ICD-10-CM

## 2017-12-07 MED ORDER — DIPHENHYDRAMINE HCL 25 MG PO CAPS
50.0000 mg | ORAL_CAPSULE | Freq: Once | ORAL | Status: AC
  Administered 2017-12-07: 50 mg via ORAL

## 2017-12-07 MED ORDER — ONDANSETRON HCL 4 MG/2ML IJ SOLN
INTRAMUSCULAR | Status: AC
  Administered 2017-12-07: 4 mg via INTRAVENOUS
  Filled 2017-12-07: qty 2

## 2017-12-07 MED ORDER — OXYCODONE-ACETAMINOPHEN 5-325 MG PO TABS
1.0000 | ORAL_TABLET | Freq: Once | ORAL | Status: AC
  Administered 2017-12-07: 1 via ORAL

## 2017-12-07 MED ORDER — MORPHINE SULFATE (PF) 2 MG/ML IV SOLN
2.0000 mg | Freq: Once | INTRAVENOUS | Status: AC
  Administered 2017-12-07: 2 mg via INTRAVENOUS

## 2017-12-07 MED ORDER — OXYCODONE-ACETAMINOPHEN 5-325 MG PO TABS
ORAL_TABLET | ORAL | Status: AC
  Administered 2017-12-07: 1 via ORAL
  Filled 2017-12-07: qty 1

## 2017-12-07 MED ORDER — MORPHINE SULFATE (PF) 2 MG/ML IV SOLN
INTRAVENOUS | Status: AC
  Administered 2017-12-07: 2 mg via INTRAVENOUS
  Filled 2017-12-07: qty 1

## 2017-12-07 MED ORDER — ONDANSETRON HCL 4 MG/2ML IJ SOLN
4.0000 mg | Freq: Once | INTRAMUSCULAR | Status: AC
  Administered 2017-12-07: 4 mg via INTRAVENOUS

## 2017-12-07 MED ORDER — IPRATROPIUM-ALBUTEROL 0.5-2.5 (3) MG/3ML IN SOLN
3.0000 mL | Freq: Once | RESPIRATORY_TRACT | Status: AC
  Administered 2017-12-07: 3 mL via RESPIRATORY_TRACT
  Filled 2017-12-07: qty 3

## 2017-12-07 NOTE — ED Notes (Signed)
Pt demanding breakfast tray. Informed him of NPO order. Discussed with Dr. Jimmye Norman who confirms and states that if patient wants to have US paracentesis STAT then he must stay NPO. Discussed with patient who states that he always has coffee before he has this procedure. Declines to refrain from coffee. Will notify us staff when they call for report that patient had coffee.

## 2017-12-07 NOTE — ED Notes (Signed)
FIRST NURSE NOTE:   Pt in lobby was supposed to be in [name] for paracentesis, as reported from [place] in [name], pt became belligerent in the medical mall with staff due to having to wait and took off his gown and ran out into the ED lobby.  Pt reported to me that he was going to go to [facility], pt said his sister was going to pick him up.  I asked patient if he was going to have his sister take him to [facility], he said no.  Informed him again that if he left he would have to start over again pt said "no I won't"  Also informed patient that if he was picked up by EMS again that would be bringing him to this ED.     Pt had IV in left hand that was removed at the front door. [employer] and [employer] present outside to try to talk to the patient.

## 2017-12-07 NOTE — ED Provider Notes (Signed)
Patient has a standing order for outpatient paracentesis.  He will be discharged from the ER and will go there immediately for paracentesis now.   Earleen Newport, MD 12/07/17 [phone]

## 2017-12-07 NOTE — ED Notes (Signed)
Pt requesting breakfast, informed that he was placed on NPO effective this past midnight. Pt states that he eats anyway. Did not given anything to eat/drink to patient. Will ask EDP.

## 2017-12-07 NOTE — ED Notes (Signed)
Per [name] staff, patient to be discharged from ER and registered as outpatient. Leave PIV in place because patient will have ordered albumin.  This RN discharged patient and wheeled patient to be registered for his US paracentesis. Pt awaiting in radiology lobby. Staff aware.

## 2017-12-09 ENCOUNTER — Inpatient Hospital Stay
Admission: EM | Admit: 2017-12-09 | Discharge: 2017-12-11 | DRG: 640 | Disposition: A | Discharge status: Home / Self Care | Payer: Medicare Other | Attending: Internal Medicine | Admitting: Internal Medicine

## 2017-12-09 ENCOUNTER — Other Ambulatory Visit: Payer: Self-pay

## 2017-12-09 ENCOUNTER — Emergency Department: Payer: Medicare Other

## 2017-12-09 DIAGNOSIS — K7031 Alcoholic cirrhosis of liver with ascites: Secondary | ICD-10-CM | POA: Diagnosis not present

## 2017-12-09 DIAGNOSIS — E875 Hyperkalemia: Secondary | ICD-10-CM | POA: Diagnosis not present

## 2017-12-09 DIAGNOSIS — I739 Peripheral vascular disease, unspecified: Secondary | ICD-10-CM | POA: Diagnosis present

## 2017-12-09 DIAGNOSIS — N289 Disorder of kidney and ureter, unspecified: Secondary | ICD-10-CM | POA: Diagnosis present

## 2017-12-09 DIAGNOSIS — J810 Acute pulmonary edema: Secondary | ICD-10-CM

## 2017-12-09 DIAGNOSIS — E8729 Other acidosis: Secondary | ICD-10-CM

## 2017-12-09 DIAGNOSIS — Z515 Encounter for palliative care: Secondary | ICD-10-CM | POA: Diagnosis present

## 2017-12-09 DIAGNOSIS — Q8781 Alport syndrome: Secondary | ICD-10-CM | POA: Diagnosis not present

## 2017-12-09 DIAGNOSIS — Z9115 Patient's noncompliance with renal dialysis: Secondary | ICD-10-CM | POA: Diagnosis not present

## 2017-12-09 DIAGNOSIS — E785 Hyperlipidemia, unspecified: Secondary | ICD-10-CM | POA: Diagnosis present

## 2017-12-09 DIAGNOSIS — R0602 Shortness of breath: Secondary | ICD-10-CM | POA: Diagnosis not present

## 2017-12-09 DIAGNOSIS — R1900 Intra-abdominal and pelvic swelling, mass and lump, unspecified site: Secondary | ICD-10-CM | POA: Diagnosis present

## 2017-12-09 DIAGNOSIS — N186 End stage renal disease: Secondary | ICD-10-CM | POA: Diagnosis not present

## 2017-12-09 DIAGNOSIS — K219 Gastro-esophageal reflux disease without esophagitis: Secondary | ICD-10-CM | POA: Diagnosis present

## 2017-12-09 DIAGNOSIS — I132 Hypertensive heart and chronic kidney disease with heart failure and with stage 5 chronic kidney disease, or end stage renal disease: Secondary | ICD-10-CM | POA: Diagnosis present

## 2017-12-09 DIAGNOSIS — N2581 Secondary hyperparathyroidism of renal origin: Secondary | ICD-10-CM | POA: Diagnosis present

## 2017-12-09 DIAGNOSIS — J449 Chronic obstructive pulmonary disease, unspecified: Secondary | ICD-10-CM | POA: Diagnosis present

## 2017-12-09 DIAGNOSIS — E8770 Fluid overload, unspecified: Secondary | ICD-10-CM

## 2017-12-09 DIAGNOSIS — K7460 Unspecified cirrhosis of liver: Secondary | ICD-10-CM | POA: Diagnosis not present

## 2017-12-09 DIAGNOSIS — R188 Other ascites: Secondary | ICD-10-CM | POA: Diagnosis not present

## 2017-12-09 DIAGNOSIS — E872 Acidosis: Secondary | ICD-10-CM | POA: Diagnosis present

## 2017-12-09 DIAGNOSIS — I2510 Atherosclerotic heart disease of native coronary artery without angina pectoris: Secondary | ICD-10-CM | POA: Diagnosis present

## 2017-12-09 DIAGNOSIS — I272 Pulmonary hypertension, unspecified: Secondary | ICD-10-CM | POA: Diagnosis present

## 2017-12-09 DIAGNOSIS — I5032 Chronic diastolic (congestive) heart failure: Secondary | ICD-10-CM | POA: Diagnosis present

## 2017-12-09 DIAGNOSIS — F1010 Alcohol abuse, uncomplicated: Secondary | ICD-10-CM | POA: Diagnosis present

## 2017-12-09 DIAGNOSIS — D631 Anemia in chronic kidney disease: Secondary | ICD-10-CM | POA: Diagnosis present

## 2017-12-09 DIAGNOSIS — Z79899 Other long term (current) drug therapy: Secondary | ICD-10-CM | POA: Diagnosis not present

## 2017-12-09 DIAGNOSIS — Z992 Dependence on renal dialysis: Secondary | ICD-10-CM

## 2017-12-09 DIAGNOSIS — F17210 Nicotine dependence, cigarettes, uncomplicated: Secondary | ICD-10-CM | POA: Diagnosis present

## 2017-12-09 DIAGNOSIS — Z7951 Long term (current) use of inhaled steroids: Secondary | ICD-10-CM | POA: Diagnosis not present

## 2017-12-09 LAB — CBC
HCT: 31.5 % — ABNORMAL LOW (ref 40.0–52.0)
HCT: 30.5 % — ABNORMAL LOW (ref 40.0–52.0)
Hemoglobin: 9.8 g/dL — ABNORMAL LOW (ref 13.0–18.0)
Hemoglobin: 9.6 g/dL — ABNORMAL LOW (ref 13.0–18.0)
MCH: 25.7 pg — ABNORMAL LOW (ref 26.0–34.0)
MCH: 25.4 pg — ABNORMAL LOW (ref 26.0–34.0)
MCHC: 31.2 g/dL — ABNORMAL LOW (ref 32.0–36.0)
MCHC: 31.4 g/dL — AB (ref 32.0–36.0)
MCV: 82.3 fL (ref 80.0–100.0)
MCV: 80.9 fL (ref 80.0–100.0)
PLATELETS: 288 10*3/uL (ref 150–440)
PLATELETS: 304 10*3/uL (ref 150–440)
RBC: 3.83 MIL/uL — ABNORMAL LOW (ref 4.40–5.90)
RBC: 3.77 MIL/uL — ABNORMAL LOW (ref 4.40–5.90)
RDW: 26.7 % — ABNORMAL HIGH (ref 11.5–14.5)
RDW: 26.1 % — AB (ref 11.5–14.5)
WBC: 7.8 10*3/uL (ref 3.8–10.6)
WBC: 7.2 10*3/uL (ref 3.8–10.6)

## 2017-12-09 LAB — CREATININE, SERUM
Creatinine, Ser: 11.09 mg/dL — ABNORMAL HIGH (ref 0.61–1.24)
GFR, EST AFRICAN AMERICAN: 5 mL/min — AB (ref >60)
GFR, EST NON AFRICAN AMERICAN: 4 mL/min — AB (ref >60)

## 2017-12-09 LAB — BASIC METABOLIC PANEL
Anion gap: 17 — ABNORMAL HIGH (ref 5–15)
BUN: 77 mg/dL — ABNORMAL HIGH (ref 6–20)
CALCIUM: 9 mg/dL (ref 8.9–10.3)
CO2: 22 mmol/L (ref 22–32)
Chloride: 103 mmol/L (ref 98–111)
Creatinine, Ser: 12.26 mg/dL — ABNORMAL HIGH (ref 0.61–1.24)
GFR calc Af Amer: 5 mL/min — ABNORMAL LOW (ref >60)
GFR calc non Af Amer: 4 mL/min — ABNORMAL LOW (ref >60)
Glucose, Bld: 92 mg/dL (ref 70–99)
Potassium: 6.5 mmol/L (ref 3.5–5.1)
Sodium: 142 mmol/L (ref 135–145)

## 2017-12-09 LAB — PHOSPHORUS: PHOSPHORUS: 5.7 mg/dL — AB (ref 2.5–4.6)

## 2017-12-09 MED ORDER — GABAPENTIN 300 MG PO CAPS
300.0000 mg | ORAL_CAPSULE | Freq: Two times a day (BID) | ORAL | Status: DC
  Administered 2017-12-09 – 2017-12-11 (×4): 300 mg via ORAL
  Filled 2017-12-09 (×4): qty 1

## 2017-12-09 MED ORDER — ACETAMINOPHEN 650 MG RE SUPP: 650.0000 mg | Freq: Four times a day (QID) | RECTAL | Status: DC | PRN

## 2017-12-09 MED ORDER — DEXTROSE 50 % IV SOLN
1.0000 | Freq: Once | INTRAVENOUS | Status: AC
  Administered 2017-12-09: 50 mL via INTRAVENOUS

## 2017-12-09 MED ORDER — FUROSEMIDE 10 MG/ML IJ SOLN
40.0000 mg | Freq: Once | INTRAMUSCULAR | Status: AC
  Administered 2017-12-09: 40 mg via INTRAVENOUS
  Filled 2017-12-09: qty 4

## 2017-12-09 MED ORDER — EPOETIN ALFA 4000 UNIT/ML IJ SOLN
4000.0000 [IU] | INTRAMUSCULAR | Status: DC
  Administered 2017-12-09: 4000 [IU] via INTRAVENOUS
  Filled 2017-12-09: qty 1

## 2017-12-09 MED ORDER — LABETALOL HCL 100 MG PO TABS
100.0000 mg | ORAL_TABLET | Freq: Every day | ORAL | Status: DC
  Administered 2017-12-09 – 2017-12-11 (×3): 100 mg via ORAL
  Filled 2017-12-09 (×3): qty 1

## 2017-12-09 MED ORDER — SODIUM BICARBONATE 8.4 % IV SOLN
50.0000 meq | Freq: Once | INTRAVENOUS | Status: AC
  Administered 2017-12-09: 50 meq via INTRAVENOUS
  Filled 2017-12-09: qty 50

## 2017-12-09 MED ORDER — CHLORHEXIDINE GLUCONATE CLOTH 2 % EX PADS
6.0000 | MEDICATED_PAD | Freq: Every day | CUTANEOUS | Status: DC
  Administered 2017-12-10 – 2017-12-11 (×2): 6 via TOPICAL
  Filled 2017-12-09: qty 6

## 2017-12-09 MED ORDER — RENA-VITE PO TABS
1.0000 | ORAL_TABLET | Freq: Every day | ORAL | Status: DC
  Administered 2017-12-09 – 2017-12-10 (×2): 1 via ORAL
  Filled 2017-12-09 (×3): qty 1

## 2017-12-09 MED ORDER — DIPHENHYDRAMINE HCL 50 MG/ML IJ SOLN
12.5000 mg | Freq: Once | INTRAMUSCULAR | Status: AC
  Administered 2017-12-09: 12.5 mg via INTRAVENOUS

## 2017-12-09 MED ORDER — IPRATROPIUM-ALBUTEROL 0.5-2.5 (3) MG/3ML IN SOLN: 3.0000 mL | Freq: Four times a day (QID) | RESPIRATORY_TRACT | Status: DC | PRN

## 2017-12-09 MED ORDER — HEPARIN SODIUM (PORCINE) 1000 UNIT/ML DIALYSIS
20.0000 [IU]/kg | INTRAMUSCULAR | Status: DC | PRN
  Filled 2017-12-09: qty 2

## 2017-12-09 MED ORDER — TIOTROPIUM BROMIDE MONOHYDRATE 18 MCG IN CAPS
18.0000 ug | ORAL_CAPSULE | Freq: Every day | RESPIRATORY_TRACT | Status: DC
  Administered 2017-12-09 – 2017-12-11 (×3): 18 ug via RESPIRATORY_TRACT
  Filled 2017-12-09: qty 5

## 2017-12-09 MED ORDER — ONDANSETRON HCL 4 MG/2ML IJ SOLN: 4.0000 mg | Freq: Four times a day (QID) | INTRAMUSCULAR | Status: DC | PRN

## 2017-12-09 MED ORDER — FOLIC ACID 1 MG PO TABS
1.0000 mg | ORAL_TABLET | Freq: Every day | ORAL | Status: DC
  Administered 2017-12-09 – 2017-12-11 (×3): 1 mg via ORAL
  Filled 2017-12-09 (×3): qty 1

## 2017-12-09 MED ORDER — DIPHENHYDRAMINE HCL 25 MG PO CAPS
25.0000 mg | ORAL_CAPSULE | ORAL | Status: DC | PRN
  Administered 2017-12-09 – 2017-12-10 (×2): 25 mg via ORAL
  Filled 2017-12-09 (×2): qty 1

## 2017-12-09 MED ORDER — BISACODYL 5 MG PO TBEC: 5.0000 mg | DELAYED_RELEASE_TABLET | Freq: Every day | ORAL | Status: DC | PRN

## 2017-12-09 MED ORDER — DEXTROSE 50 % IV SOLN
INTRAVENOUS | Status: AC
  Filled 2017-12-09: qty 50

## 2017-12-09 MED ORDER — HYDROCODONE-ACETAMINOPHEN 5-325 MG PO TABS
1.0000 | ORAL_TABLET | ORAL | Status: DC | PRN
  Administered 2017-12-09 – 2017-12-11 (×10): 2 via ORAL
  Filled 2017-12-09 (×10): qty 2

## 2017-12-09 MED ORDER — ACETAMINOPHEN 325 MG PO TABS: 650.0000 mg | ORAL_TABLET | Freq: Four times a day (QID) | ORAL | Status: DC | PRN

## 2017-12-09 MED ORDER — PANTOPRAZOLE SODIUM 40 MG PO TBEC
40.0000 mg | DELAYED_RELEASE_TABLET | Freq: Every day | ORAL | Status: DC
  Administered 2017-12-10 – 2017-12-11 (×2): 40 mg via ORAL
  Filled 2017-12-09 (×2): qty 1

## 2017-12-09 MED ORDER — DOCUSATE SODIUM 100 MG PO CAPS
100.0000 mg | ORAL_CAPSULE | Freq: Two times a day (BID) | ORAL | Status: DC
  Administered 2017-12-09 – 2017-12-11 (×4): 100 mg via ORAL
  Filled 2017-12-09 (×4): qty 1

## 2017-12-09 MED ORDER — CALCIUM ACETATE (PHOS BINDER) 667 MG PO CAPS
2001.0000 mg | ORAL_CAPSULE | Freq: Three times a day (TID) | ORAL | Status: DC
  Administered 2017-12-10 – 2017-12-11 (×3): 2001 mg via ORAL
  Filled 2017-12-09 (×4): qty 3

## 2017-12-09 MED ORDER — HEPARIN SODIUM (PORCINE) 5000 UNIT/ML IJ SOLN
5000.0000 [IU] | Freq: Three times a day (TID) | INTRAMUSCULAR | Status: DC
  Administered 2017-12-09 – 2017-12-11 (×6): 5000 [IU] via SUBCUTANEOUS
  Filled 2017-12-09 (×6): qty 1

## 2017-12-09 MED ORDER — SPACER/AERO CHAMBER MOUTHPIECE MISC: 1.0000 [IU] | Status: DC | PRN

## 2017-12-09 MED ORDER — INSULIN ASPART 100 UNIT/ML ~~LOC~~ SOLN
10.0000 [IU] | Freq: Once | SUBCUTANEOUS | Status: AC
  Administered 2017-12-09: 10 [IU] via INTRAVENOUS

## 2017-12-09 MED ORDER — ONDANSETRON HCL 4 MG PO TABS: 4.0000 mg | ORAL_TABLET | Freq: Four times a day (QID) | ORAL | Status: DC | PRN

## 2017-12-09 MED ORDER — MOMETASONE FURO-FORMOTEROL FUM 200-5 MCG/ACT IN AERO
2.0000 | INHALATION_SPRAY | Freq: Two times a day (BID) | RESPIRATORY_TRACT | Status: DC
  Administered 2017-12-09 – 2017-12-11 (×4): 2 via RESPIRATORY_TRACT
  Filled 2017-12-09: qty 8.8

## 2017-12-09 MED ORDER — FERROUS SULFATE 325 (65 FE) MG PO TABS
325.0000 mg | ORAL_TABLET | Freq: Three times a day (TID) | ORAL | Status: DC
  Administered 2017-12-09 – 2017-12-11 (×5): 325 mg via ORAL
  Filled 2017-12-09 (×5): qty 1

## 2017-12-09 MED ORDER — INSULIN ASPART 100 UNIT/ML ~~LOC~~ SOLN
SUBCUTANEOUS | Status: AC
  Filled 2017-12-09: qty 1

## 2017-12-09 NOTE — Progress Notes (Signed)
Post HD assessment    12/09/17 1500  Neurological  Level of Consciousness Alert  Orientation Level Oriented X4  Respiratory  Respiratory Pattern Regular;Tachypnea  Chest Assessment Chest expansion symmetrical  Cardiac  ECG Monitor Yes  Ectopy Multifocal PVC's  Ectopy Frequency Frequent  Vascular  R Radial Pulse +2  L Radial Pulse +2  Edema Generalized  Integumentary  Integumentary (WDL) X  Skin Color Appropriate for ethnicity  Musculoskeletal  Musculoskeletal (WDL) X  Generalized Weakness Yes  Assistive Device None  GU Assessment  Genitourinary (WDL) X  Genitourinary Symptoms  (HD)  Psychosocial  Psychosocial (WDL) WDL

## 2017-12-09 NOTE — Progress Notes (Signed)
HD tx end. Pt requested to be taken off early, 23 minutes remaining, MD aware.    12/09/17 1530  Vital Signs  Pulse Rate 84  Pulse Rate Source Monitor  Resp 20  BP 125/78  BP Location Left Arm  BP Method Automatic  Patient Position (if appropriate) Lying  Oxygen Therapy  SpO2 97 %  O2 Device Room Air  During Hemodialysis Assessment  Dialysis Fluid Bolus Normal Saline  Bolus Amount (mL) 250 mL  Intra-Hemodialysis Comments Tx completed

## 2017-12-09 NOTE — H&P (Signed)
[place] [facility] [place] NAME: Stanley [name]    MR#:  [id number]  DATE OF BIRTH:  09-29-1960  DATE OF ADMISSION:  12/09/2017  PRIMARY CARE PHYSICIAN: Alene Mires Elyse Jarvis, MD   REQUESTING/REFERRING PHYSICIAN: Dr. Kelli Hope  CHIEF COMPLAINT: Shortness of breath, abdominal distention   Chief Complaint  Patient presents with  . Shortness of Breath    HISTORY OF PRESENT ILLNESS:  Stanley [name]  is a 57 y.o. male with a known history of sudden hemodialysis Tuesday, Thursday, Saturday missed dialysis on Thursday comes in because of worsening shortness of breath, abdominal distention.  Patient states that he came to the ER Wednesday for paracentesis but had to wait too long before he gets so he left without getting paracentesis.  Patient usually gets weekly paracentesis.  Missed dialysis treatment on Thursday patient could not tell why he missed it.  Today he is found to have potassium 6.5.  He is not hypoxic with O2 sat 96% on room air and rest of his vitals are stable.  Urgent hemodialysis is arranged by nephrologist Dr. Murlean Iba today.  Patient will be admitted to telemetry. PAST MEDICAL HISTORY:   Past Medical History:  Diagnosis Date  . Alcohol abuse   . CHF (congestive heart failure) ([place])   . Cirrhosis ([place])   . Coronary artery disease 2009  . Drug abuse ([place])   . End stage renal disease on dialysis [facility]) NEPHROLOGIST-   DR [facility]  IN [place]   HEMODIALYSIS --   TUES/  THURS/  SAT  . Gastrointestinal bleed 06/13/2017   From chart...hx of multiple GI bleeds  . GERD (gastroesophageal reflux disease)   . Hyperlipidemia   . Hypertension   . PAD (peripheral artery disease) ([place])   . Renal insufficiency    Per pt, 32 oz fluid restriction per day  . S/P triple vessel bypass 06/09/2016   2009ish  . Suicidal ideation    & HOMICIDAL IDEATION --  06-16-2013   ADMITTED TO BEHAVIOR HEALTH    PAST SURGICAL HISTOIRY:   Past  Surgical History:  Procedure Laterality Date  . A/V FISTULAGRAM Right 06/06/2017   Procedure: A/V FISTULAGRAM;  Surgeon: Katha Cabal, MD;  Location: [place] CV LAB;  Service: Cardiovascular;  Laterality: Right;  . A/V SHUNT INTERVENTION N/A 06/06/2017   Procedure: A/V SHUNT INTERVENTION;  Surgeon: Katha Cabal, MD;  Location: [place] CV LAB;  Service: Cardiovascular;  Laterality: N/A;  . AGILE CAPSULE N/A 06/19/2016   Procedure: AGILE CAPSULE;  Surgeon: [name], MD;  Location: [facility] ENDOSCOPY;  Service: Endoscopy;  Laterality: N/A;  . COLONOSCOPY WITH PROPOFOL N/A 06/18/2016   Procedure: COLONOSCOPY WITH PROPOFOL;  Surgeon: [name], MD;  Location: [facility] ENDOSCOPY;  Service: Endoscopy;  Laterality: N/A;  . COLONOSCOPY WITH PROPOFOL N/A 08/12/2016   Procedure: COLONOSCOPY WITH PROPOFOL;  Surgeon: [name], MD;  Location: [facility] ENDOSCOPY;  Service: Endoscopy;  Laterality: N/A;  . COLONOSCOPY WITH PROPOFOL N/A 05/05/2017   Procedure: COLONOSCOPY WITH PROPOFOL;  Surgeon: Manya Silvas, MD;  Location: [facility] ENDOSCOPY;  Service: Endoscopy;  Laterality: N/A;  . CORONARY ANGIOPLASTY  ?   PT UNABLE TO TELL IF  BEFORE OR AFTER  CABG  . CORONARY ARTERY BYPASS GRAFT  2008  ([place] , [place])   3 VESSEL  . DIALYSIS FISTULA CREATION  LAST SURGERY  APPOX  2008  . ENTEROSCOPY N/A 05/10/2016   Procedure: ENTEROSCOPY;  Surgeon: Jerene Bears, MD;  Location: [facility] ENDOSCOPY;  Service: Gastroenterology;  Laterality: N/A;  . ENTEROSCOPY N/A 08/12/2016   Procedure: ENTEROSCOPY;  Surgeon: [name], MD;  Location: [facility] ENDOSCOPY;  Service: Endoscopy;  Laterality: N/A;  . ENTEROSCOPY Left 06/03/2017   Procedure: ENTEROSCOPY;  Surgeon: Virgel Manifold, MD;  Location: [facility] ENDOSCOPY;  Service: Endoscopy;  Laterality: Left;  Procedure date will ultimately depend on when patient is medically optimized before the procedure, pending hemodialysis and blood transfusions etc. Will place on schedule and  change depending on clinical status.   . ENTEROSCOPY N/A 06/05/2017   Procedure: ENTEROSCOPY;  Surgeon: Virgel Manifold, MD;  Location: [facility] ENDOSCOPY;  Service: Endoscopy;  Laterality: N/A;  . ENTEROSCOPY N/A 06/15/2017   Procedure: Push ENTEROSCOPY;  Surgeon: [name], MD;  Location: [facility] ENDOSCOPY;  Service: Endoscopy;  Laterality: N/A;  . ESOPHAGOGASTRODUODENOSCOPY N/A 05/07/2015   Procedure: ESOPHAGOGASTRODUODENOSCOPY (EGD);  Surgeon: Hulen Luster, MD;  Location: [facility] ENDOSCOPY;  Service: Endoscopy;  Laterality: N/A;  . ESOPHAGOGASTRODUODENOSCOPY (EGD) WITH PROPOFOL N/A 05/17/2015   Procedure: ESOPHAGOGASTRODUODENOSCOPY (EGD) WITH PROPOFOL;  Surgeon: [name], MD;  Location: [facility] ENDOSCOPY;  Service: Endoscopy;  Laterality: N/A;  . ESOPHAGOGASTRODUODENOSCOPY (EGD) WITH PROPOFOL N/A 01/20/2016   Procedure: ESOPHAGOGASTRODUODENOSCOPY (EGD) WITH PROPOFOL;  Surgeon: [name], MD;  Location: [facility] ENDOSCOPY;  Service: Endoscopy;  Laterality: N/A;  . ESOPHAGOGASTRODUODENOSCOPY (EGD) WITH PROPOFOL N/A 04/17/2016   Procedure: ESOPHAGOGASTRODUODENOSCOPY (EGD) WITH PROPOFOL;  Surgeon: Lin Landsman, MD;  Location: [facility] ENDOSCOPY;  Service: Gastroenterology;  Laterality: N/A;  . ESOPHAGOGASTRODUODENOSCOPY (EGD) WITH PROPOFOL  05/09/2016   Procedure: ESOPHAGOGASTRODUODENOSCOPY (EGD) WITH PROPOFOL;  Surgeon: Jerene Bears, MD;  Location: [place];  Service: Endoscopy;;  . ESOPHAGOGASTRODUODENOSCOPY (EGD) WITH PROPOFOL N/A 06/16/2016   Procedure: ESOPHAGOGASTRODUODENOSCOPY (EGD) WITH PROPOFOL;  Surgeon: [name], MD;  Location: [facility] ENDOSCOPY;  Service: Endoscopy;  Laterality: N/A;  . ESOPHAGOGASTRODUODENOSCOPY (EGD) WITH PROPOFOL N/A 05/05/2017   Procedure: ESOPHAGOGASTRODUODENOSCOPY (EGD) WITH PROPOFOL;  Surgeon: Manya Silvas, MD;  Location: [facility] ENDOSCOPY;  Service: Endoscopy;  Laterality: N/A;  . ESOPHAGOGASTRODUODENOSCOPY (EGD) WITH PROPOFOL N/A 06/15/2017   Procedure:  ESOPHAGOGASTRODUODENOSCOPY (EGD) WITH PROPOFOL;  Surgeon: [name], MD;  Location: [facility] ENDOSCOPY;  Service: Endoscopy;  Laterality: N/A;  . GIVENS CAPSULE STUDY N/A 05/07/2016   Procedure: GIVENS CAPSULE STUDY;  Surgeon: Doran Stabler, MD;  Location: [place];  Service: Endoscopy;  Laterality: N/A;  . MANDIBULAR HARDWARE REMOVAL N/A 07/29/2013   Procedure: REMOVAL OF ARCH BARS;  Surgeon: Theodoro Kos, DO;  Location: [place];  Service: Plastics;  Laterality: N/A;  . ORIF MANDIBULAR FRACTURE N/A 06/05/2013   Procedure: REPAIR OF MANDIBULAR FRACTURE x 2 with maxillo-mandibular fixation ;  Surgeon: Theodoro Kos, DO;  Location: [place];  Service: Plastics;  Laterality: N/A;  . PARACENTESIS    . PERIPHERAL ARTERIAL STENT GRAFT Left     SOCIAL HISTORY:   Social History   Tobacco Use  . Smoking status: Current Every Day Smoker    Packs/day: 0.15    Years: 40.00    Pack years: 6.00    Types: Cigarettes  . Smokeless tobacco: Never Used  Substance Use Topics  . Alcohol use: No    Comment: pt reports quitting after learning about cirrhosis    FAMILY HISTORY:   Family History  Problem Relation Age of Onset  . Colon cancer Mother   . Cancer Father   . Cancer Sister   . Kidney disease Brother     DRUG ALLERGIES:  No Known  Allergies  REVIEW OF SYSTEMS:  CONSTITUTIONAL: No fever, fatigue or weakness.  EYES: No blurred or double vision.  EARS, NOSE, AND THROAT: No tinnitus or ear pain.  RESPIRATORY:  has shortness of breath, pedal edema. GASTROINTESTINAL: No nausea, vomiting, diarrhea , complains of abdominal swelling, pain gENITOURINARY: No dysuria, hematuria.  ENDOCRINE: No polyuria, nocturia,  HEMATOLOGY: No anemia, easy bruising or bleeding SKIN: No rash or lesion. MUSCULOSKELETAL: No joint pain or arthritis.   NEUROLOGIC: No tingling, numbness, weakness.  PSYCHIATRY: No anxiety or depression.   MEDICATIONS AT HOME:   Prior to Admission  medications   Medication Sig Start Date End Date Taking? Authorizing Provider  budesonide-formoterol (SYMBICORT) 160-4.5 MCG/ACT inhaler Inhale 2 puffs into the lungs daily. 11/16/16  Yes Vaughan Basta, MD  calcium acetate (PHOSLO) 667 MG capsule Take 2,001 mg by mouth 3 (three) times daily.   Yes [provider]  ferrous sulfate 325 (65 FE) MG tablet Take 1 tablet by mouth 3 (three) times daily. 01/21/17  Yes [provider]  folic acid (FOLVITE) 1 MG tablet Take 1 tablet (1 mg total) by mouth daily. 10/31/17  Yes Fritzi Mandes, MD  gabapentin (NEURONTIN) 300 MG capsule Take 300 mg by mouth 2 (two) times daily.  08/16/16  Yes [provider]  ipratropium-albuterol (DUONEB) 0.5-2.5 (3) MG/3ML SOLN Take 3 mLs by nebulization every 6 (six) hours as needed. 11/28/16  Yes Wieting, Richard, MD  labetalol (NORMODYNE) 100 MG tablet Take 100 mg by mouth daily. 03/24/17  Yes [provider]  multivitamin (RENA-VIT) TABS tablet Take 1 tablet by mouth at bedtime. 10/31/17  Yes Fritzi Mandes, MD  nitroGLYCERIN (NITROSTAT) 0.4 MG SL tablet Place 1 tablet (0.4 mg total) under the tongue every 5 (five) minutes as needed. 04/12/16  Yes Wende Bushy, MD  omeprazole (PRILOSEC) 40 MG capsule Take 40 mg by mouth daily. 09/08/17  Yes [provider]  Spacer/Aero Chamber Mouthpiece MISC 1 Units by Does not apply route every 4 (four) hours as needed (wheezing). 02/19/17   Darel Hong, MD  tiotropium (SPIRIVA HANDIHALER) 18 MCG inhalation capsule Place 1 capsule (18 mcg total) into inhaler and inhale daily. Patient not taking: Reported on 12/09/2017 11/16/16 12/09/17  Vaughan Basta, MD      VITAL SIGNS:  Blood pressure 130/84, pulse 76, temperature 97.7 F (36.5 C), temperature source Oral, resp. rate 19, height 6\' 3"  (1.905 m), weight 81.6 kg, SpO2 96 %.  PHYSICAL EXAMINATION:  GENERAL:  57 y.o.-year-old patient lying in the bed with no acute distress.  EYES: Pupils  equal, round, reactive to light and accommodation. No scleral icterus. Extraocular muscles intact.  HEENT: Head atraumatic, normocephalic. Oropharynx and nasopharynx clear.  NECK:  Supple, no jugular venous distention. No thyroid enlargement, no tenderness.  LUNGS: Diminished air entry bilaterally. CARDIOVASCULAR: S1, S2 normal. No murmurs, rubs, or gallops.  ABDOMEN: Soft, distended with ascites.Marland Kitchen  EXTREMITIES: No pedal edema, cyanosis, or clubbing.  NEUROLOGIC: Cranial nerves II through XII are intact. Muscle strength 5/5 in all extremities. Sensation intact. Gait not checked.  PSYCHIATRIC: The patient is alert and oriented x 3.  SKIN: No obvious rash, lesion, or ulcer.   LABORATORY PANEL:   CBC Recent Labs  Lab 12/09/17 0849  WBC 7.8  HGB 9.8*  HCT 31.5*  PLT 288   ------------------------------------------------------------------------------------------------------------------  Chemistries  Recent Labs  Lab 12/06/17 1813 12/09/17 0849  NA 141 142  K 5.2* 6.5*  CL 106 103  CO2 23 22  GLUCOSE 94 92  BUN 46* 77*  CREATININE 8.48* 12.26*  CALCIUM 8.8* 9.0  AST 32  --   ALT 18  --   ALKPHOS 137*  --   BILITOT 0.7  --    ------------------------------------------------------------------------------------------------------------------  Cardiac Enzymes No results for input(s): TROPONINI in the last 168 hours. ------------------------------------------------------------------------------------------------------------------  RADIOLOGY:  Dg Chest Portable 1 View  Result Date: 12/09/2017 CLINICAL DATA:  Shortness of breath.  Renal failure EXAM: PORTABLE CHEST 1 VIEW COMPARISON:  December 06, 2017 FINDINGS: Central catheter tip is in the superior vena cava. No pneumothorax. There is no appreciable edema or consolidation. There is cardiomegaly with mild pulmonary venous hypertension. There is aortic atherosclerosis. Patient is status post coronary artery bypass grafting. No  bone lesions evident. IMPRESSION: Cardiomegaly with pulmonary vascular congestion. No edema or consolidation. Central catheter tip in superior vena cava without pneumothorax. There is aortic atherosclerosis. Aortic Atherosclerosis (ICD10-I70.0). Electronically Signed   By: Lowella Grip III M.D.   On: 12/09/2017 09:04    EKG:   Orders placed or performed during the hospital encounter of 12/09/17  . ED EKG  . ED EKG  . EKG 12-Lead  . EKG 12-Lead  EKG shows normal sinus rhythm 74 bpm without peak T waves.  IMPRESSION AND PLAN:  57 year old black male with ESRD on hemodialysis due to Alport syndrome on Tuesday, Thursday, Saturday, ascites gets weekly paracentesis comes in because of worsening shortness of breath, worsening abdominal ascites. 1.  Acute on chronic hyperkalemia: Patient missed hemodialysis on Thursday, admitted to telemetry due to hyperkalemia will get urgent hemodialysis today and possibly tomorrow.  Patient has recurrent admissions admitted about 5 times in the last 6 months with hyperkalemia and similar complaints. 2.  Acute pulmonary edema due to missed hemodialysis session.  Patient also has ascites, will arrange for ultrasound-guided paracentesis before the discharge.  Paracentesis was on 916 removed 5 L. 3.  Hyperkalemia without EKG changes.  Received insulin, dextrose, Lasix, sort of bicarb in the emergency room  For COPD: No wheezing.  Continue home dose inhalers.  Spoke  with Dr. Murlean Iba. All the records are reviewed and case discussed with ED provider. Management plans discussed with the patient, family and they are in agreement.  CODE STATUS: Full code  TOTAL TIME TAKING CARE OF THIS PATIENT: 55 minutes.    Epifanio Lesches M.D on 12/09/2017 at 10:53 AM  Between 7am to 6pm - Pager - [phone]  After 6pm go to [email or web address] - password [email or web address]  Office  [phone]  CC: Primary care physician; [name], MD  Note: This dictation was prepared with Dragon dictation along with smaller phrase technology. Any transcriptional errors that result from this process are unintentional.

## 2017-12-09 NOTE — ED Notes (Signed)
Date and time results received: 12/09/17 0945   Test: potassium Critical Value: 6.5  Name of Provider Notified: Dr. Alfred Levins

## 2017-12-09 NOTE — Progress Notes (Signed)
Hd started  

## 2017-12-09 NOTE — ED Triage Notes (Signed)
Pt arrives to ed vis ems from home. Ems reports pt called out for SOB and pt missed dialysis treatment yesterday. Pt abdomen distended and tight. Increased SOB when sitting up. Expiratory wheezes left lungs. Pt alert and oriented on arrival. NAD noted at this time .

## 2017-12-09 NOTE — Progress Notes (Signed)
[facility]  ROUNDING NOTE   Subjective:  Patient known to our practice from previous admission as well as outpatient dialysis.  This time he presents for shortness of breath and abdominal distention from ascites.  He states he came to the emergency room on Wednesday but had to wait too long therefore left without getting his paracentesis done.  Patient missed his dialysis treatment on Thursday.  He is not able to tell me why he missed. Evaluation in the emergency room shows oxygen saturation of 96% on room air.  Blood pressure elevated at 138/80.  Potassium is elevated at 6.5.  Patient is being admitted for further evaluation and urgent hemodialysis.  Objective:  Vital signs in last 24 hours:  Temp:  [97.7 F (36.5 C)] 97.7 F (36.5 C) (09/28 0824) Pulse Rate:  [74] 74 (09/28 0824) Resp:  [21] 21 (09/28 0824) BP: (128)/(83) 128/83 (09/28 0824) SpO2:  [97 %] 97 % (09/28 0824) Weight:  [81.6 kg] 81.6 kg (09/28 0825)  Weight change:  Filed Weights   12/09/17 0825  Weight: 81.6 kg    Intake/Output: No intake/output data recorded.   Intake/Output this shift:  No intake/output data recorded.  Physical Exam: General: NAD, laying in bed  Head: Normocephalic, atraumatic. Moist oral mucosal membranes  Eyes: Anicteric,   Neck: Supple, trachea midline  Lungs:  Clear to auscultation  Heart: Regular rate and rhythm  Abdomen:  Soft, severely distended, +ascites  Extremities:   peripheral edema.  Neurologic: +Nonfocal, moving all four extremities  Skin: No lesions  Access: RIJ permcath    Basic Metabolic Panel: Recent Labs  Lab 12/03/17 2014 12/06/17 1813 12/09/17 0849  NA 139 141 142  K 4.3 5.2* 6.5*  CL 103 106 103  CO2 25 23 22   GLUCOSE 134* 94 92  BUN 40* 46* 77*  CREATININE 7.42* 8.48* 12.26*  CALCIUM 8.4* 8.8* 9.0    Liver Function Tests: Recent Labs  Lab 12/03/17 2014 12/06/17 1813  AST 22 32  ALT 22 18  ALKPHOS 149* 137*  BILITOT 0.6 0.7   PROT 6.4* 6.7  ALBUMIN 3.1* 3.0*   Recent Labs  Lab 12/03/17 2014  LIPASE 30   No results for input(s): AMMONIA in the last 168 hours.  CBC: Recent Labs  Lab 12/03/17 2014 12/06/17 1813 12/09/17 0849  WBC 5.1 5.8 7.8  NEUTROABS 3.7  --   --   HGB 8.9* 9.0* 9.8*  HCT 29.2* 29.1* 31.5*  MCV 81.1 80.9 82.3  PLT 236 238 288    Cardiac Enzymes: No results for input(s): CKTOTAL, CKMB, CKMBINDEX, TROPONINI in the last 168 hours.  BNP: Invalid input(s): POCBNP  CBG: No results for input(s): GLUCAP in the last 168 hours.  Microbiology: Results for orders placed or performed during the hospital encounter of 11/18/17  MRSA PCR Screening     Status: None   Collection Time: 11/19/17  2:01 AM  Result Value Ref Range Status   MRSA by PCR NEGATIVE NEGATIVE Final    Comment:        The GeneXpert MRSA Assay (FDA approved for NASAL specimens only), is one component of a comprehensive MRSA colonization surveillance program. It is not intended to diagnose MRSA infection nor to guide or monitor treatment for MRSA infections. Performed at [facility], [address] [place], [place] [address]     Coagulation Studies: Recent Labs    12/06/17 1813  LABPROT 14.1  INR 1.10    Urinalysis: No results for  input(s): COLORURINE, LABSPEC, PHURINE, GLUCOSEU, HGBUR, BILIRUBINUR, KETONESUR, PROTEINUR, UROBILINOGEN, NITRITE, LEUKOCYTESUR in the last 72 hours.  Invalid input(s): APPERANCEUR    Imaging: Dg Chest Portable 1 View  Result Date: 12/09/2017 CLINICAL DATA:  Shortness of breath.  Renal failure EXAM: PORTABLE CHEST 1 VIEW COMPARISON:  December 06, 2017 FINDINGS: Central catheter tip is in the superior vena cava. No pneumothorax. There is no appreciable edema or consolidation. There is cardiomegaly with mild pulmonary venous hypertension. There is aortic atherosclerosis. Patient is status post coronary artery bypass grafting. No bone lesions evident. IMPRESSION:  Cardiomegaly with pulmonary vascular congestion. No edema or consolidation. Central catheter tip in superior vena cava without pneumothorax. There is aortic atherosclerosis. Aortic Atherosclerosis (ICD10-I70.0). Electronically Signed   By: Lowella Grip III M.D.   On: 12/09/2017 09:04     Medications:    . insulin aspart      . dextrose  1 ampule Intravenous Once  . dextrose      . furosemide  40 mg Intravenous Once  . sodium bicarbonate  50 mEq Intravenous Once     Assessment/ Plan:  Stanley [name] is a 57 y.o. black male withend stage renal disease on hemodialysis secondary to Alport's syndrome, ascites, hypertension, anemia of chronic kidney disease, coronary artery disease, peripheral vascular disease, hyperlipidemia, gastrointestinal AVMs, pulmonary hypertension  [facility] TTS   1.  End stage renal diseasewith hyperkalemia:   - TTS schedule.  - We will arrange for urgent hemodialysis today  2.  Acute pulm edema/Volume overload - UF with HD as tolerated  3.   Ascites  large volume ultrasound guided paracentesis, 9/16. Removed 5 liters.  Another paracentesis if IR services available today  4.  Anemia of chronic kidney disease:   - EPO with HD treatment  5.  Secondary hyperparathyroidism - calcium acetate with meals.    LOS: 0 Hillarie Harrigan 9/28/201910:06 AM

## 2017-12-09 NOTE — Progress Notes (Signed)
Talked to Dr. Vianne Bulls if patient can have benadryl, complaints of itching, order given for PRN. RN will continue to monitor.

## 2017-12-09 NOTE — ED Provider Notes (Addendum)
[facility] Emergency Department Provider Note  ____________________________________________  Time seen: Approximately 8:56 AM  I have reviewed the triage vital signs and the nursing notes.   HISTORY  Chief Complaint Shortness of Breath   HPI Stanley [name] is a 57 y.o. male with a history of ESRD on HD and cirrhosis complicated by ascites requiring weekly paracentesis who presents for evaluation of shortness of breath after missing his weekly paracentesis and last 2 dialysis treatment.  Patient goes to dialysis Mondays, Wednesdays, and Fridays.  His last treatment was 5 days ago.  Also was supposed to undergo paracentesis 2 days ago but left after becoming belligerent with staff due to wait time.  He reports that he has been feeling progressively worsening shortness of breath over the last few days, orthopnea, and swelling of his lower extremities.  No chest pain.  Patient is also complaining of severe distention of his abdomen but no abdominal pain, fever, vomiting.  Past Medical History:  Diagnosis Date  . Alcohol abuse   . CHF (congestive heart failure) ([place])   . Cirrhosis ([place])   . Coronary artery disease 2009  . Drug abuse ([place])   . End stage renal disease on dialysis [facility]) NEPHROLOGIST-   DR [facility]  IN [place]   HEMODIALYSIS --   TUES/  THURS/  SAT  . Gastrointestinal bleed 06/13/2017   From chart...hx of multiple GI bleeds  . GERD (gastroesophageal reflux disease)   . Hyperlipidemia   . Hypertension   . PAD (peripheral artery disease) ([place])   . Renal insufficiency    Per pt, 32 oz fluid restriction per day  . S/P triple vessel bypass 06/09/2016   2009ish  . Suicidal ideation    & HOMICIDAL IDEATION --  06-16-2013   ADMITTED TO BEHAVIOR HEALTH    Patient Active Problem List   Diagnosis Date Noted  . Acute respiratory failure ([place]) 10/29/2017  . ESRD (end stage renal disease) on dialysis ([place]) 07/28/2017  . Protein-calorie  malnutrition, severe 06/14/2017  . Encounter for dialysis [facility])   . Palliative care by specialist   . Goals of care, counseling/discussion   . Malnutrition of moderate degree 06/05/2017  . Secondary esophageal varices without bleeding ([place])   . Stomach irritation   . Idiopathic esophageal varices without bleeding ([place])   . Alcoholic hepatitis with ascites 05/24/2017  . ESRD (end stage renal disease) ([place]) 04/28/2017  . Uremia 03/08/2017  . ESRD on hemodialysis ([place]) 03/03/2017  . Weakness 02/28/2017  . Hypocalcemia 02/22/2017  . Shortness of breath 11/26/2016  . COPD (chronic obstructive pulmonary disease) ([place]) 10/30/2016  . COPD exacerbation ([place]) 10/29/2016  . Anemia   . Heme positive stool   . Ulceration of intestine   . Benign neoplasm of transverse colon   . Acute gastrointestinal hemorrhage   . Esophageal candidiasis ([place])   . Angiodysplasia of intestinal tract   . Acute respiratory failure with hypoxia ([place]) 07/03/2016  . GI bleeding 06/24/2016  . Rectal bleeding 06/14/2016  . Anemia of chronic disease 06/01/2016  . MRSA carrier 06/01/2016  . Chronic renal failure 05/23/2016  . Ischemic heart disease 05/23/2016  . Angiodysplasia of small intestine   . Melena   . Small bowel bleed not requiring more than 4 units of blood in 24 hours, ICU, or surgery   . Anemia due to chronic blood loss   . Abdominal pain 05/05/2016  . Acute posthemorrhagic anemia 04/17/2016  . Gastrointestinal bleed 04/17/2016  . History  of esophagogastroduodenoscopy (EGD) 04/17/2016  . Elevated troponin 04/17/2016  . Alcohol abuse 04/17/2016  . Upper GI bleed 01/19/2016  . Blood in stool   . Angiodysplasia of stomach and duodenum with hemorrhage   . Gastritis   . Reflux esophagitis   . GI bleed 05/16/2015  . Acute GI bleeding   . Symptomatic anemia 04/30/2015  . HTN (hypertension) 04/06/2015  . GERD (gastroesophageal reflux disease) 04/06/2015  . HLD (hyperlipidemia) 04/06/2015  . Dyspnea  04/06/2015  . Cirrhosis of liver with ascites ([place]) 04/06/2015  . Ascites 04/06/2015  . GIB (gastrointestinal bleeding) 03/23/2015  . Homicidal ideation 06/19/2013  . Suicidal intent 06/19/2013  . Homicidal ideations 06/19/2013  . Hyperkalemia 06/16/2013  . Mandible fracture ([place]) 06/05/2013  . Fracture, mandible ([place]) 06/02/2013  . Coronary atherosclerosis of native coronary artery 06/02/2013  . ESRD on dialysis ([place]) 06/02/2013  . Mandible open fracture ([place]) 06/02/2013    Past Surgical History:  Procedure Laterality Date  . A/V FISTULAGRAM Right 06/06/2017   Procedure: A/V FISTULAGRAM;  Surgeon: Katha Cabal, MD;  Location: [place] CV [facility];  Service: Cardiovascular;  Laterality: Right;  . A/V SHUNT INTERVENTION N/A 06/06/2017   Procedure: A/V SHUNT INTERVENTION;  Surgeon: Katha Cabal, MD;  Location: [place] [facility];  Service: Cardiovascular;  Laterality: N/A;  . AGILE CAPSULE N/A 06/19/2016   Procedure: AGILE CAPSULE;  Surgeon: [name], MD;  Location: [facility] ENDOSCOPY;  Service: Endoscopy;  Laterality: N/A;  . COLONOSCOPY WITH PROPOFOL N/A 06/18/2016   Procedure: COLONOSCOPY WITH PROPOFOL;  Surgeon: [name], MD;  Location: [facility] ENDOSCOPY;  Service: Endoscopy;  Laterality: N/A;  . COLONOSCOPY WITH PROPOFOL N/A 08/12/2016   Procedure: COLONOSCOPY WITH PROPOFOL;  Surgeon: [name], MD;  Location: [facility] ENDOSCOPY;  Service: Endoscopy;  Laterality: N/A;  . COLONOSCOPY WITH PROPOFOL N/A 05/05/2017   Procedure: COLONOSCOPY WITH PROPOFOL;  Surgeon: Manya Silvas, MD;  Location: [facility] ENDOSCOPY;  Service: Endoscopy;  Laterality: N/A;  . CORONARY ANGIOPLASTY  ?   PT UNABLE TO TELL IF  BEFORE OR AFTER  CABG  . CORONARY ARTERY BYPASS GRAFT  2008  ([place])   3 VESSEL  . DIALYSIS FISTULA CREATION  LAST SURGERY  APPOX  2008  . ENTEROSCOPY N/A 05/10/2016   Procedure: ENTEROSCOPY;  Surgeon: Jerene Bears, MD;  Location: [place];  Service: Gastroenterology;   Laterality: N/A;  . ENTEROSCOPY N/A 08/12/2016   Procedure: ENTEROSCOPY;  Surgeon: [name], MD;  Location: [facility] ENDOSCOPY;  Service: Endoscopy;  Laterality: N/A;  . ENTEROSCOPY Left 06/03/2017   Procedure: ENTEROSCOPY;  Surgeon: Virgel Manifold, MD;  Location: [facility] ENDOSCOPY;  Service: Endoscopy;  Laterality: Left;  Procedure date will ultimately depend on when patient is medically optimized before the procedure, pending hemodialysis and blood transfusions etc. Will place on schedule and change depending on clinical status.   . ENTEROSCOPY N/A 06/05/2017   Procedure: ENTEROSCOPY;  Surgeon: Virgel Manifold, MD;  Location: [facility] ENDOSCOPY;  Service: Endoscopy;  Laterality: N/A;  . ENTEROSCOPY N/A 06/15/2017   Procedure: Push ENTEROSCOPY;  Surgeon: [name], MD;  Location[facility] ENDOSCOPY;  Service: Endoscopy;  Laterality: N/A;  . ESOPHAGOGASTRODUODENOSCOPY N/A 05/07/2015   Procedure: ESOPHAGOGASTRODUODENOSCOPY (EGD);  Surgeon: Hulen Luster, MD;  Location: [facility] ENDOSCOPY;  Service: Endoscopy;  Laterality: N/A;  . ESOPHAGOGASTRODUODENOSCOPY (EGD) WITH PROPOFOL N/A 05/17/2015   Procedure: ESOPHAGOGASTRODUODENOSCOPY (EGD) WITH PROPOFOL;  Surgeon: [name], MD;  Location: [facility] ENDOSCOPY;  Service: Endoscopy;  Laterality: N/A;  . ESOPHAGOGASTRODUODENOSCOPY (  EGD) WITH PROPOFOL N/A 01/20/2016   Procedure: ESOPHAGOGASTRODUODENOSCOPY (EGD) WITH PROPOFOL;  Surgeon: [name], MD;  Location: [facility] ENDOSCOPY;  Service: Endoscopy;  Laterality: N/A;  . ESOPHAGOGASTRODUODENOSCOPY (EGD) WITH PROPOFOL N/A 04/17/2016   Procedure: ESOPHAGOGASTRODUODENOSCOPY (EGD) WITH PROPOFOL;  Surgeon: Lin Landsman, MD;  Location: [facility] ENDOSCOPY;  Service: Gastroenterology;  Laterality: N/A;  . ESOPHAGOGASTRODUODENOSCOPY (EGD) WITH PROPOFOL  05/09/2016   Procedure: ESOPHAGOGASTRODUODENOSCOPY (EGD) WITH PROPOFOL;  Surgeon: Jerene Bears, MD;  Location: [place];  Service: Endoscopy;;  . ESOPHAGOGASTRODUODENOSCOPY (EGD)  WITH PROPOFOL N/A 06/16/2016   Procedure: ESOPHAGOGASTRODUODENOSCOPY (EGD) WITH PROPOFOL;  Surgeon: [name], MD;  Location: [facility] ENDOSCOPY;  Service: Endoscopy;  Laterality: N/A;  . ESOPHAGOGASTRODUODENOSCOPY (EGD) WITH PROPOFOL N/A 05/05/2017   Procedure: ESOPHAGOGASTRODUODENOSCOPY (EGD) WITH PROPOFOL;  Surgeon: Manya Silvas, MD;  Location: [facility] ENDOSCOPY;  Service: Endoscopy;  Laterality: N/A;  . ESOPHAGOGASTRODUODENOSCOPY (EGD) WITH PROPOFOL N/A 06/15/2017   Procedure: ESOPHAGOGASTRODUODENOSCOPY (EGD) WITH PROPOFOL;  Surgeon: [name], MD;  Location: [facility] ENDOSCOPY;  Service: Endoscopy;  Laterality: N/A;  . GIVENS CAPSULE STUDY N/A 05/07/2016   Procedure: GIVENS CAPSULE STUDY;  Surgeon: Doran Stabler, MD;  Location: [place];  Service: Endoscopy;  Laterality: N/A;  . MANDIBULAR HARDWARE REMOVAL N/A 07/29/2013   Procedure: REMOVAL OF ARCH BARS;  Surgeon: Theodoro Kos, DO;  Location: [place];  Service: Plastics;  Laterality: N/A;  . ORIF MANDIBULAR FRACTURE N/A 06/05/2013   Procedure: REPAIR OF MANDIBULAR FRACTURE x 2 with maxillo-mandibular fixation ;  Surgeon: Theodoro Kos, DO;  Location: [place];  Service: Plastics;  Laterality: N/A;  . PARACENTESIS    . PERIPHERAL ARTERIAL STENT GRAFT Left     Prior to Admission medications   Medication Sig Start Date End Date Taking? Authorizing Provider  B Complex-C-Zn-Folic Acid (DIALYVITE/ZINC) TABS Take 1 tablet by mouth daily. 09/08/17   [provider]  budesonide-formoterol (SYMBICORT) 160-4.5 MCG/ACT inhaler Inhale 2 puffs into the lungs daily. 11/16/16   Vaughan Basta, MD  calcium acetate (PHOSLO) 667 MG capsule Take 2,001 mg by mouth 3 (three) times daily.    [provider]  ferrous sulfate 325 (65 FE) MG tablet Take 1 tablet by mouth 3 (three) times daily. 01/21/17   [provider]  folic acid (FOLVITE) 1 MG tablet Take 1 tablet (1 mg total) by mouth daily. 10/31/17   Fritzi Mandes, MD  gabapentin (NEURONTIN) 300 MG capsule Take 300 mg by mouth 2 (two) times daily.  08/16/16   [provider]  ipratropium-albuterol (DUONEB) 0.5-2.5 (3) MG/3ML SOLN Take 3 mLs by nebulization every 6 (six) hours as needed. 11/28/16   Loletha Grayer, MD  labetalol (NORMODYNE) 100 MG tablet Take 100 mg by mouth daily. 03/24/17   [provider]  multivitamin (RENA-VIT) TABS tablet Take 1 tablet by mouth at bedtime. 10/31/17   Fritzi Mandes, MD  nitroGLYCERIN (NITROSTAT) 0.4 MG SL tablet Place 1 tablet (0.4 mg total) under the tongue every 5 (five) minutes as needed. 04/12/16   Wende Bushy, MD  omeprazole (PRILOSEC) 40 MG capsule Take 40 mg by mouth daily. 09/08/17   [provider]  Spacer/Aero Chamber Mouthpiece MISC 1 Units by Does not apply route every 4 (four) hours as needed (wheezing). 02/19/17   Darel Hong, MD  tiotropium (SPIRIVA HANDIHALER) 18 MCG inhalation capsule Place 1 capsule (18 mcg total) into inhaler and inhale daily. 11/16/16 11/18/17  Vaughan Basta, MD    Allergies Patient has no known allergies.  Family History  Problem Relation Age of Onset  . Colon cancer Mother   . Cancer Father   . Cancer Sister   . Kidney disease Brother     Social History Social History   Tobacco Use  . Smoking status: Current Every Day Smoker    Packs/day: 0.15    Years: 40.00    Pack years: 6.00    Types: Cigarettes  . Smokeless tobacco: Never Used  Substance Use Topics  . Alcohol use: No    Comment: pt reports quitting after learning about cirrhosis  . Drug use: No    Frequency: 7.0 times per week    Types: Marijuana, Cocaine    Review of Systems  Constitutional: Negative for fever. Eyes: Negative for visual changes. ENT: Negative for sore throat. Neck: No neck pain  Cardiovascular: Negative for chest pain. Respiratory: + shortness of breath. Gastrointestinal: Negative for abdominal pain, vomiting or diarrhea. + Abdominal  distention Genitourinary: Negative for dysuria. Musculoskeletal: Negative for back pain. + Leg swelling Skin: Negative for rash. Neurological: Negative for headaches, weakness or numbness. Psych: No SI or HI  ____________________________________________   PHYSICAL EXAM:  VITAL SIGNS: ED Triage Vitals  Enc Vitals Group     BP 12/09/17 0824 128/83     Pulse Rate 12/09/17 0824 74     Resp 12/09/17 0824 (!) 21     Temp 12/09/17 0824 97.7 F (36.5 C)     Temp Source 12/09/17 0824 Oral     SpO2 12/09/17 0824 97 %     Weight 12/09/17 0825 180 lb (81.6 kg)     Height 12/09/17 0825 6\' 3"  (1.905 m)     Head Circumference --      Peak Flow --      Pain Score 12/09/17 0825 8     Pain Loc --      Pain Edu? --      Excl. in [place]? --     Constitutional: Alert and oriented. Well appearing and in no apparent distress. HEENT:      Head: Normocephalic and atraumatic.         Eyes: Conjunctivae are normal. Sclera is non-icteric.       Mouth/Throat: Mucous membranes are moist.       Neck: Supple with no signs of meningismus. Cardiovascular: Regular rate and rhythm. No murmurs, gallops, or rubs. 2+ symmetrical distal pulses are present in all extremities. No JVD. Respiratory: Increased work of breathing, tachypneic, no hypoxia, crackles bilaterally. Gastrointestinal: Distended, taut abdomen with no tenderness Musculoskeletal: 2+ pitting edema bilaterally Neurologic: Normal speech and language. Face is symmetric. Moving all extremities. No gross focal neurologic deficits are appreciated. Skin: Skin is warm, dry and intact. No rash noted. Psychiatric: Mood and affect are normal. Speech and behavior are normal.  ____________________________________________   LABS (all labs ordered are listed, but only abnormal results are displayed)  Labs Reviewed  CBC - Abnormal; Notable for the following components:      Result Value   RBC 3.83 (*)    Hemoglobin 9.8 (*)    HCT 31.5 (*)    MCH 25.7 (*)     MCHC 31.2 (*)    RDW 26.7 (*)    All other components within normal limits  BASIC METABOLIC PANEL - Abnormal; Notable for the following components:   Potassium 6.5 (*)    BUN 77 (*)    Creatinine, Ser 12.26 (*)    GFR calc non Af Amer 4 (*)    GFR calc  Af Amer 5 (*)    Anion gap 17 (*)    All other components within normal limits   ____________________________________________  EKG  ED ECG REPORT I, Rudene Re, the attending physician, personally viewed and interpreted this ECG.  Normal sinus rhythm, rate of 74, nonspecific interventricular conduction delay, prolonged QTC, right axis deviation, T wave inversions in anterior leads, no ST elevations, depressions, or peaked T waves.  No significant changes when compared to prior. ____________________________________________  RADIOLOGY  I have personally reviewed the images performed during this visit and I agree with the Radiologist's read.   Interpretation by Radiologist:  Dg Chest Portable 1 View  Result Date: 12/09/2017 CLINICAL DATA:  Shortness of breath.  Renal failure EXAM: PORTABLE CHEST 1 VIEW COMPARISON:  December 06, 2017 FINDINGS: Central catheter tip is in the superior vena cava. No pneumothorax. There is no appreciable edema or consolidation. There is cardiomegaly with mild pulmonary venous hypertension. There is aortic atherosclerosis. Patient is status post coronary artery bypass grafting. No bone lesions evident. IMPRESSION: Cardiomegaly with pulmonary vascular congestion. No edema or consolidation. Central catheter tip in superior vena cava without pneumothorax. There is aortic atherosclerosis. Aortic Atherosclerosis (ICD10-I70.0). Electronically Signed   By: Lowella Grip III M.D.   On: 12/09/2017 09:04      ____________________________________________   PROCEDURES  Procedure(s) performed: None Procedures Critical Care performed: yes  CRITICAL CARE Performed by: Rudene Re  ?  Total  critical care time: 40 min  Critical care time was exclusive of separately billable procedures and treating other patients.  Critical care was necessary to treat or prevent imminent or life-threatening deterioration.  Critical care was time spent personally by me on the following activities: development of treatment plan with patient and/or surrogate as well as nursing, discussions with consultants, evaluation of patient's response to treatment, examination of patient, obtaining history from patient or surrogate, ordering and performing treatments and interventions, ordering and review of laboratory studies, ordering and review of radiographic studies, pulse oximetry and re-evaluation of patient's condition.  ____________________________________________   INITIAL IMPRESSION / ASSESSMENT AND PLAN / ED COURSE  57 y.o. male with a history of ESRD on HD and cirrhosis complicated by ascites requiring weekly paracentesis who presents for evaluation of shortness of breath after missing his weekly paracentesis and last 2 dialysis treatment.  Patient looks volume overloaded with pitting edema and crackles on exam, also has a very taut to distended abdomen with no tenderness.  No hypoxia.  Labs showing hyperkalemia with K of 6.5 and anion gap with normal bicarb.  No EKG changes of hyperkalemia.  Chest x-ray also concerning for cardiomegaly and pulmonary vascular congestion.  Therefore at this time patient meets criteria for emergent dialysis. Dr. [name] paged at 09:41AM. Will treat hyperkalemia at this time with Lasix since patient still makes urine, D50, insulin, and bicarbonate.    _________________________ 10:08 AM on 12/09/2017 -----------------------------------------  I spoke with Dr. [name] who recommended admission for emergent dialysis today and possibly tomorrow as well. Patient will also need paracentesis. Discussed with Hospitalist for admission.    _________________________ 10:10 AM on  12/09/2017 -----------------------------------------  Dr. [name] at bedside to evaluate patient for emergent dialysis.  As part of my medical decision making, I reviewed the following data within the [place] notes reviewed and incorporated, Labs reviewed , EKG interpreted , Old EKG reviewed, Old chart reviewed, Radiograph reviewed , Discussed with admitting physician , A consult was requested and obtained from this/these consultant(s) Nephrology,  Notes from prior ED visits and  Controlled Substance Database    Pertinent labs & imaging results that were available during my care of the patient were reviewed by me and considered in my medical decision making (see chart for details).    ____________________________________________   FINAL CLINICAL IMPRESSION(S) / ED DIAGNOSES  Final diagnoses:  Hypervolemia, unspecified hypervolemia type  Hyperkalemia  Increased anion gap metabolic acidosis  Acute pulmonary edema (HCC)  Ascites of liver      NEW MEDICATIONS STARTED DURING THIS VISIT:  ED Discharge Orders    None       Note:  This document was prepared using Dragon voice recognition software and may include unintentional dictation errors.    Alfred Levins, [place], MD 12/09/17 [place], [place], MD 12/09/17 1010

## 2017-12-09 NOTE — ED Notes (Signed)
Pt to dialysis.

## 2017-12-09 NOTE — Progress Notes (Signed)
HD tx end. Pt tolerated tx well. Pt c/o cramping during HD tx, UF decreased. Net UF 2690, goal not met. Pt requested to be taken off early, 23 minutes remaining in HD tx, MD aware.     12/09/17 1541  Vital Signs  Temp 98.2 F (36.8 C)  Temp Source Oral  Pulse Rate 85  Pulse Rate Source Monitor  Resp (!) 22  BP 123/78  BP Location Left Arm  BP Method Automatic  Patient Position (if appropriate) Lying  Oxygen Therapy  SpO2 96 %  O2 Device Room Air  Dialysis Weight  Weight 79.8 kg  Type of Weight Post-Dialysis  Post-Hemodialysis Assessment  Rinseback Volume (mL) 250 mL  KECN 79.8 V  Dialyzer Clearance Lightly streaked  Duration of HD Treatment -hour(s) 3.5 hour(s)  Hemodialysis Intake (mL) 500 mL  UF Total -Machine (mL) 3190 mL  Net UF (mL) 2690 mL  Tolerated HD Treatment Yes  Hemodialysis Catheter Right Internal jugular Double-lumen  No Placement Date or Time found.   Placed prior to admission: Yes  Orientation: Right  Access Location: Internal jugular  Hemodialysis Catheter Type: Double-lumen  Site Condition No complications  Blue Lumen Status Heparin locked  Red Lumen Status Heparin locked  Purple Lumen Status N/A  Catheter fill solution Heparin 1000 units/ml  Catheter fill volume (Arterial) 1.5 cc  Catheter fill volume (Venous) 1.5  Dressing Type Biopatch  Dressing Status Dressing changed  Interventions New dressing  Drainage Description None  Dressing Change Due 12/16/17  Post treatment catheter status Capped and Clamped

## 2017-12-10 LAB — BASIC METABOLIC PANEL
Anion gap: 12 (ref 5–15)
BUN: 43 mg/dL — ABNORMAL HIGH (ref 6–20)
CHLORIDE: 101 mmol/L (ref 98–111)
CO2: 28 mmol/L (ref 22–32)
CREATININE: 8.17 mg/dL — AB (ref 0.61–1.24)
Calcium: 8.3 mg/dL — ABNORMAL LOW (ref 8.9–10.3)
GFR calc Af Amer: 7 mL/min — ABNORMAL LOW (ref >60)
GFR calc non Af Amer: 6 mL/min — ABNORMAL LOW (ref >60)
Glucose, Bld: 144 mg/dL — ABNORMAL HIGH (ref 70–99)
POTASSIUM: 4.7 mmol/L (ref 3.5–5.1)
Sodium: 141 mmol/L (ref 135–145)

## 2017-12-10 LAB — CBC
HCT: 32.3 % — ABNORMAL LOW (ref 40.0–52.0)
Hemoglobin: 10.1 g/dL — ABNORMAL LOW (ref 13.0–18.0)
MCH: 25.2 pg — ABNORMAL LOW (ref 26.0–34.0)
MCHC: 31.2 g/dL — ABNORMAL LOW (ref 32.0–36.0)
MCV: 81 fL (ref 80.0–100.0)
PLATELETS: 268 10*3/uL (ref 150–440)
RBC: 3.99 MIL/uL — AB (ref 4.40–5.90)
RDW: 26.8 % — ABNORMAL HIGH (ref 11.5–14.5)
WBC: 6 10*3/uL (ref 3.8–10.6)

## 2017-12-10 LAB — GLUCOSE, CAPILLARY: Glucose-Capillary: 125 mg/dL — ABNORMAL HIGH (ref 70–99)

## 2017-12-10 NOTE — Progress Notes (Signed)
[facility]  ROUNDING NOTE   Subjective:  Patient known to our practice from previous admission as well as outpatient dialysis.  This time he presents for shortness of breath and abdominal distention from ascites.  Patient underwent urgent hemodialysis yesterday.  2700 cc of fluid was removed.  Patient stopped dialysis treatment early because he was "hungry".  This morning, he is on room air.  Continues to have abdominal distention.  Ultrasound-guided paracentesis has been ordered.  Objective:  Vital signs in last 24 hours:  Temp:  [97.7 F (36.5 C)-98.2 F (36.8 C)] 97.7 F (36.5 C) (09/29 0835) Pulse Rate:  [69-89] 69 (09/29 0835) Resp:  [15-24] 18 (09/29 0835) BP: (104-133)/(65-84) 115/84 (09/29 0835) SpO2:  [94 %-100 %] 97 % (09/29 0835) Weight:  [79.6 kg-79.8 kg] 79.6 kg (09/29 0427)  Weight change:  Filed Weights   12/09/17 0825 12/09/17 1541 12/10/17 0427  Weight: 81.6 kg 79.8 kg 79.6 kg    Intake/Output: I/O last 3 completed shifts: In: 240 [P.O.:240] Out: 2690 [Other:2690]   Intake/Output this shift:  Total I/O In: 480 [P.O.:480] Out: -   Physical Exam: General: NAD, laying in bed  Head: Normocephalic, atraumatic. Moist oral mucosal membranes  Eyes: Anicteric,   Neck: Supple, trachea midline  Lungs:   Bilateral crackles, room air  Heart: Regular rate and rhythm  Abdomen:  Soft, severely distended, +ascites  Extremities:   peripheral edema.  Neurologic: +Nonfocal, moving all four extremities  Skin: No lesions  Access: RIJ permcath    Basic Metabolic Panel: Recent Labs  Lab 12/03/17 2014 12/06/17 1813 12/09/17 0849 12/09/17 1159 12/10/17 0515  NA 139 141 142  --  141  K 4.3 5.2* 6.5*  --  4.7  CL 103 106 103  --  101  CO2 25 23 22   --  28  GLUCOSE 134* 94 92  --  144*  BUN 40* 46* 77*  --  43*  CREATININE 7.42* 8.48* 12.26* 11.09* 8.17*  CALCIUM 8.4* 8.8* 9.0  --  8.3*  PHOS  --   --   --  5.7*  --     Liver Function  Tests: Recent Labs  Lab 12/03/17 2014 12/06/17 1813  AST 22 32  ALT 22 18  ALKPHOS 149* 137*  BILITOT 0.6 0.7  PROT 6.4* 6.7  ALBUMIN 3.1* 3.0*   Recent Labs  Lab 12/03/17 2014  LIPASE 30   No results for input(s): AMMONIA in the last 168 hours.  CBC: Recent Labs  Lab 12/03/17 2014 12/06/17 1813 12/09/17 0849 12/09/17 1159 12/10/17 0515  WBC 5.1 5.8 7.8 7.2 6.0  NEUTROABS 3.7  --   --   --   --   HGB 8.9* 9.0* 9.8* 9.6* 10.1*  HCT 29.2* 29.1* 31.5* 30.5* 32.3*  MCV 81.1 80.9 82.3 80.9 81.0  PLT 236 238 288 304 268    Cardiac Enzymes: No results for input(s): CKTOTAL, CKMB, CKMBINDEX, TROPONINI in the last 168 hours.  BNP: Invalid input(s): POCBNP  CBG: Recent Labs  Lab 12/10/17 0834  GLUCAP 125*    Microbiology: Results for orders placed or performed during the hospital encounter of 11/18/17  MRSA PCR Screening     Status: None   Collection Time: 11/19/17  2:01 AM  Result Value Ref Range Status   MRSA by PCR NEGATIVE NEGATIVE Final    Comment:        The GeneXpert MRSA Assay (FDA approved for NASAL specimens only), is one component of a  comprehensive MRSA colonization surveillance program. It is not intended to diagnose MRSA infection nor to guide or monitor treatment for MRSA infections. Performed at [facility], [address] [place], [place] [address]     Coagulation Studies: No results for input(s): LABPROT, INR in the last 72 hours.  Urinalysis: No results for input(s): COLORURINE, LABSPEC, PHURINE, GLUCOSEU, HGBUR, BILIRUBINUR, KETONESUR, PROTEINUR, UROBILINOGEN, NITRITE, LEUKOCYTESUR in the last 72 hours.  Invalid input(s): APPERANCEUR    Imaging: Dg Chest Portable 1 View  Result Date: 12/09/2017 CLINICAL DATA:  Shortness of breath.  Renal failure EXAM: PORTABLE CHEST 1 VIEW COMPARISON:  December 06, 2017 FINDINGS: Central catheter tip is in the superior vena cava. No pneumothorax. There is no appreciable edema or  consolidation. There is cardiomegaly with mild pulmonary venous hypertension. There is aortic atherosclerosis. Patient is status post coronary artery bypass grafting. No bone lesions evident. IMPRESSION: Cardiomegaly with pulmonary vascular congestion. No edema or consolidation. Central catheter tip in superior vena cava without pneumothorax. There is aortic atherosclerosis. Aortic Atherosclerosis (ICD10-I70.0). Electronically Signed   By: Lowella Grip III M.D.   On: 12/09/2017 09:04     Medications:    . calcium acetate  2,001 mg Oral TID WC  . Chlorhexidine Gluconate Cloth  6 each Topical Q0600  . docusate sodium  100 mg Oral BID  . epoetin (EPOGEN/PROCRIT) injection  4,000 Units Intravenous Q T,Th,Sa-HD  . ferrous sulfate  325 mg Oral TID WC  . folic acid  1 mg Oral Daily  . gabapentin  300 mg Oral BID  . heparin  5,000 Units Subcutaneous Q8H  . labetalol  100 mg Oral Daily  . mometasone-formoterol  2 puff Inhalation BID  . multivitamin  1 tablet Oral QHS  . pantoprazole  40 mg Oral Daily  . tiotropium  18 mcg Inhalation Daily     Assessment/ Plan:  Stanley [name] is a 57 y.o. black male withend stage renal disease on hemodialysis secondary to Stanley [name], ascites, hypertension, anemia of chronic kidney disease, coronary artery disease, peripheral vascular disease, hyperlipidemia, gastrointestinal AVMs, pulmonary hypertension  [facility] TTS   1.  End stage renal diseasewith hyperkalemia:   - TTS schedule.  -Patient can be evaluated on Monday for additional dialysis treatment  2.  Acute pulm edema/Volume overload - UF with HD as tolerated. - 2700 cc of fluid removed with dialysis on Saturday.  3.   Ascites  large volume ultrasound guided paracentesis, 9/16. Removed 5 liters.  Another paracentesis planned for this admission. Counseled patient regarding fluid restriction  4.  Anemia of chronic kidney disease:   - EPO with HD treatment  5.   Secondary hyperparathyroidism - calcium acetate with meals.    LOS: 1 [name] 9/29/20191:46 PM

## 2017-12-10 NOTE — Progress Notes (Signed)
[place] [facility] [place] NAME: Stanley [name]    MR#:  [id number]  DATE OF BIRTH:  Aug 11, 1960  SUBJECTIVE:  CHIEF COMPLAINT:   Chief Complaint  Patient presents with  . Shortness of Breath   Shortness of breath and abdominal distention. REVIEW OF SYSTEMS:  Review of Systems  Constitutional: Negative for chills, fever and malaise/fatigue.  HENT: Negative for sore throat.   Eyes: Negative for blurred vision and double vision.  Respiratory: Positive for shortness of breath. Negative for cough, hemoptysis, sputum production, wheezing and stridor.   Cardiovascular: Negative for chest pain, palpitations, orthopnea and leg swelling.  Gastrointestinal: Negative for abdominal pain, blood in stool, diarrhea, melena, nausea and vomiting.  Genitourinary: Negative for dysuria, flank pain and hematuria.  Musculoskeletal: Negative for back pain and joint pain.  Skin: Negative for rash.  Neurological: Negative for dizziness, sensory change, focal weakness, seizures, loss of consciousness, weakness and headaches.  Endo/Heme/Allergies: Negative for polydipsia.  Psychiatric/Behavioral: Negative for depression. The patient is not nervous/anxious.     DRUG ALLERGIES:  No Known Allergies VITALS:  Blood pressure 115/84, pulse 69, temperature 97.7 F (36.5 C), resp. rate 18, height 6\' 3"  (1.905 m), weight 79.6 kg, SpO2 97 %. PHYSICAL EXAMINATION:  Physical Exam  Constitutional: He is oriented to person, place, and time.  HENT:  Head: Normocephalic.  Mouth/Throat: Oropharynx is clear and moist.  Eyes: Pupils are equal, round, and reactive to light. Conjunctivae and EOM are normal. No scleral icterus.  Neck: Normal range of motion. Neck supple. No JVD present. No tracheal deviation present.  Cardiovascular: Normal rate, regular rhythm and normal heart sounds. Exam reveals no gallop.  No murmur heard. Pulmonary/Chest: Effort normal. No respiratory distress.  He has wheezes. He has no rales.  Abdominal: Bowel sounds are normal. He exhibits no distension. There is tenderness. There is no rebound.  Highly distended.  Musculoskeletal: Normal range of motion. He exhibits no edema or tenderness.  Neurological: He is alert and oriented to person, place, and time. No cranial nerve deficit.  Skin: No rash noted. No erythema.  Psychiatric: He has a normal mood and affect.   LABORATORY PANEL:  Male CBC Recent Labs  Lab 12/10/17 0515  WBC 6.0  HGB 10.1*  HCT 32.3*  PLT 268   ------------------------------------------------------------------------------------------------------------------ Chemistries  Recent Labs  Lab 12/06/17 1813  12/10/17 0515  NA 141   < > 141  K 5.2*   < > 4.7  CL 106   < > 101  CO2 23   < > 28  GLUCOSE 94   < > 144*  BUN 46*   < > 43*  CREATININE 8.48*   < > 8.17*  CALCIUM 8.8*   < > 8.3*  AST 32  --   --   ALT 18  --   --   ALKPHOS 137*  --   --   BILITOT 0.7  --   --    < > = values in this interval not displayed.   RADIOLOGY:  No results found. ASSESSMENT AND PLAN:   57 year old black male with ESRD on hemodialysis due to [place] syndrome on Tuesday, Thursday, Saturday, ascites gets weekly paracentesis comes in because of worsening shortness of breath, worsening abdominal ascites.  1.  Acute on chronic hyperkalemia: Received insulin, dextrose, Lasix, sort of bicarb in the emergency room. Improved with urgent hemodialysis.  No hemodialysis today per Dr. [name].    2.  Acute pulmonary  edema due to missed hemodialysis session.  ESRD. Continue hemodialysis as scheduled.  3. Ascites, ultrasound-guided paracentesis. 4. COPD: Stable.  Continue home dose inhalers.  Discussed with Dr. Murlean Iba. All the records are reviewed and case discussed with Care Management/Social Worker. Management plans discussed with the patient, family and they are in agreement.  CODE STATUS: Full Code  TOTAL TIME TAKING CARE  OF THIS PATIENT: 33 minutes.   More than 50% of the time was spent in counseling/coordination of care: YES  POSSIBLE D/C IN 1-2 DAYS, DEPENDING ON CLINICAL CONDITION.   Demetrios Loll M.D on 12/10/2017 at 1:40 PM  Between 7am to 6pm - Pager - [phone]  After 6pm go to [email or web address] - [facility]

## 2017-12-11 ENCOUNTER — Encounter (INDEPENDENT_AMBULATORY_CARE_PROVIDER_SITE_OTHER): Payer: Medicare Other

## 2017-12-11 ENCOUNTER — Encounter (INDEPENDENT_AMBULATORY_CARE_PROVIDER_SITE_OTHER): Payer: Medicare Other | Admitting: Vascular Surgery

## 2017-12-11 ENCOUNTER — Inpatient Hospital Stay: Payer: Medicare Other

## 2017-12-11 ENCOUNTER — Other Ambulatory Visit (INDEPENDENT_AMBULATORY_CARE_PROVIDER_SITE_OTHER): Payer: Medicare Other

## 2017-12-11 LAB — GLUCOSE, CAPILLARY: GLUCOSE-CAPILLARY: 94 mg/dL (ref 70–99)

## 2017-12-11 NOTE — Progress Notes (Signed)
Pt has removed IV with catheter intact. Pt has removed monitor. Pt states that he will not wait to be discharged. Pt states that he knows he is leaving against medical advice and has signed form. States '"I don't care".

## 2017-12-11 NOTE — Progress Notes (Signed)
Pt has returned from ultrasound. Standing in hallway demanding to be discharged right away. States that he has doctors appointment at 1pm. [facility] informed pt wants to be discharged.

## 2017-12-11 NOTE — Discharge Summary (Addendum)
Stanley [name]    MR#:  [id number]  DATE OF BIRTH:  1960/03/26  DATE OF ADMISSION:  12/09/2017   ADMITTING PHYSICIAN: Epifanio Lesches, MD  DATE OF DISCHARGE: 12/11/2017 11:15 AM  PRIMARY CARE PHYSICIAN: [name], MD   ADMISSION DIAGNOSIS:  Hyperkalemia [E87.5] Acute pulmonary edema (HCC) [J81.0] Increased anion gap metabolic acidosis [E36.6] Ascites of liver [R18.8] Hypervolemia, unspecified hypervolemia type [E87.70] DISCHARGE DIAGNOSIS:  Active Problems:   Hyperkalemia  SECONDARY DIAGNOSIS:   Past Medical History:  Diagnosis Date  . Alcohol abuse   . CHF (congestive heart failure) ([place])   . Cirrhosis ([place])   . Coronary artery disease 2009  . Drug abuse ([place])   . End stage renal disease on dialysis [facility]) NEPHROLOGIST-   [facility]  IN [place]   HEMODIALYSIS --   TUES/  THURS/  SAT  . Gastrointestinal bleed 06/13/2017   From chart...hx of multiple GI bleeds  . GERD (gastroesophageal reflux disease)   . Hyperlipidemia   . Hypertension   . PAD (peripheral artery disease) ([place])   . Renal insufficiency    Per pt, 32 oz fluid restriction per day  . S/P triple vessel bypass 06/09/2016   2009ish  . Suicidal ideation    & HOMICIDAL IDEATION --  06-16-2013   ADMITTED TO [place] COURSE:   Stanley [name] is a 57 year old male with a PMH of HTN, HLD, ESRD on HD, PAD, s/p triple vessel bypass, chronic diastolic heart failure who presented to the ED with shortness of breath, abdominal distension, and missed HD session. In the ED, potassium was 6.5 and he was admitted for further management.  Hyperkalemia- resolved - received insulin + dextrose, lasix, and bicarb in the ED - underwent urgent HD on 9/28 - continued on TTS HD schedule - seen by nephrology  Acute pulmonary edema- felt to be due to missed HD session - improved after HD - patient not requiring oxygen this  admission  Ascites- related to known liver cirrhosis - paracentesis 12/11/17 with 5L fluid removed - no immediate complications from procedure  COPD- stable, no signs of acute exacerbation - continued home inhalers  Patient left AMA on the day of discharge.  DISCHARGE CONDITIONS:  ESRD on HD Ascites Hypertension Hyperlipidemia COPD CONSULTS OBTAINED:  Treatment Team:  Murlean Iba, MD DRUG ALLERGIES:  No Known Allergies DISCHARGE MEDICATIONS:   Allergies as of 12/11/2017   No Known Allergies     Medication List    TAKE these medications   budesonide-formoterol 160-4.5 MCG/ACT inhaler Commonly known as:  SYMBICORT Inhale 2 puffs into the lungs daily.   calcium acetate 667 MG capsule Commonly known as:  PHOSLO Take 2,001 mg by mouth 3 (three) times daily.   ferrous sulfate 325 (65 FE) MG tablet Take 1 tablet by mouth 3 (three) times daily.   folic acid 1 MG tablet Commonly known as:  FOLVITE Take 1 tablet (1 mg total) by mouth daily.   gabapentin 300 MG capsule Commonly known as:  NEURONTIN Take 300 mg by mouth 2 (two) times daily.   ipratropium-albuterol 0.5-2.5 (3) MG/3ML Soln Commonly known as:  DUONEB Take 3 mLs by nebulization every 6 (six) hours as needed.   labetalol 100 MG tablet Commonly known as:  NORMODYNE Take 100 mg by mouth daily.   multivitamin Tabs tablet Take 1 tablet by mouth at bedtime.   nitroGLYCERIN 0.4 MG SL  tablet Commonly known as:  NITROSTAT Place 1 tablet (0.4 mg total) under the tongue every 5 (five) minutes as needed.   omeprazole 40 MG capsule Commonly known as:  PRILOSEC Take 40 mg by mouth daily.   Spacer/Aero Chamber Mouthpiece Misc 1 Units by Does not apply route every 4 (four) hours as needed (wheezing).   tiotropium 18 MCG inhalation capsule Commonly known as:  SPIRIVA Place 1 capsule (18 mcg total) into inhaler and inhale daily.        DISCHARGE INSTRUCTIONS:  1. F/u with PCP in 1-2 weeks 2. Continue  with TTS HD schedule DIET:  Renal diet DISCHARGE CONDITION:  Stable ACTIVITY:  Activity as tolerated OXYGEN:  Home Oxygen: No.  Oxygen Delivery: room air DISCHARGE LOCATION:  home   If you experience worsening of your admission symptoms, develop shortness of breath, life threatening emergency, suicidal or homicidal thoughts you must seek medical attention immediately by calling 911 or calling your MD immediately  if symptoms less severe.  You Must read complete instructions/literature along with all the possible adverse reactions/side effects for all the Medicines you take and that have been prescribed to you. Take any new Medicines after you have completely understood and accpet all the possible adverse reactions/side effects.   Please note  You were cared for by a hospitalist during your hospital stay. If you have any questions about your discharge medications or the care you received while you were in the hospital after you are discharged, you can call the unit and asked to speak with the hospitalist on call if the hospitalist that took care of you is not available. Once you are discharged, your primary care physician will handle any further medical issues. Please note that NO REFILLS for any discharge medications will be authorized once you are discharged, as it is imperative that you return to your primary care physician (or establish a relationship with a primary care physician if you do not have one) for your aftercare needs so that they can reassess your need for medications and monitor your lab values.    On the day of Discharge:  VITAL SIGNS:  Blood pressure 122/75, pulse 81, temperature 97.7 F (36.5 C), temperature source Oral, resp. rate 18, height 6\' 3"  (1.905 m), weight 84.9 kg, SpO2 94 %. PHYSICAL EXAMINATION:  GENERAL:  57 y.o.-year-old patient lying in the bed with no acute distress.  EYES: Pupils equal, round, reactive to light and accommodation. No scleral icterus.  Extraocular muscles intact.  HEENT: Head atraumatic, normocephalic. Oropharynx and nasopharynx clear.  NECK:  Supple, no jugular venous distention. No thyroid enlargement, no tenderness.  LUNGS: Normal breath sounds bilaterally, no wheezing, rales,rhonchi or crepitation. No use of accessory muscles of respiration.  CARDIOVASCULAR: S1, S2 normal. No murmurs, rubs, or gallops.  ABDOMEN: Soft, non-tender, +abdominal distension and fluid wave, no rebound or guarding. Bowel sounds present. No organomegaly or mass.  EXTREMITIES: No pedal edema, cyanosis, or clubbing.  NEUROLOGIC: Cranial nerves II through XII are intact. Muscle strength 5/5 in all extremities. Sensation intact. Gait not checked.  PSYCHIATRIC: The patient is alert and oriented x 3.  SKIN: No obvious rash, lesion, or ulcer.  DATA REVIEW:   CBC Recent Labs  Lab 12/10/17 0515  WBC 6.0  HGB 10.1*  HCT 32.3*  PLT 268    Chemistries  Recent Labs  Lab 12/06/17 1813  12/10/17 0515  NA 141   < > 141  K 5.2*   < > 4.7  CL  106   < > 101  CO2 23   < > 28  GLUCOSE 94   < > 144*  BUN 46*   < > 43*  CREATININE 8.48*   < > 8.17*  CALCIUM 8.8*   < > 8.3*  AST 32  --   --   ALT 18  --   --   ALKPHOS 137*  --   --   BILITOT 0.7  --   --    < > = values in this interval not displayed.     Microbiology Results  Results for orders placed or performed during the hospital encounter of 11/18/17  MRSA PCR Screening     Status: None   Collection Time: 11/19/17  2:01 AM  Result Value Ref Range Status   MRSA by PCR NEGATIVE NEGATIVE Final    Comment:        The GeneXpert MRSA Assay (FDA approved for NASAL specimens only), is one component of a comprehensive MRSA colonization surveillance program. It is not intended to diagnose MRSA infection nor to guide or monitor treatment for MRSA infections. Performed at [facility], [address] [place], [place] [address]     RADIOLOGY:  US Paracentesis  Result Date:  12/11/2017 INDICATION: 57 year old male with alcoholic cirrhosis and recurrent large volume ascites. He presents today for routine paracentesis. EXAM: ULTRASOUND GUIDED  PARACENTESIS MEDICATIONS: None. COMPLICATIONS: None immediate. PROCEDURE: Informed written consent was obtained from the patient after a discussion of the risks, benefits and alternatives to treatment. A timeout was performed prior to the initiation of the procedure. Initial ultrasound scanning demonstrates a large amount of ascites within the right lower abdominal quadrant. The right lower abdomen was prepped and draped in the usual sterile fashion. 1% lidocaine with epinephrine was used for local anesthesia. Following this, a 6 Fr Safe-T-Centesis catheter was introduced. An ultrasound image was saved for documentation purposes. The paracentesis was performed. The catheter was removed and a dressing was applied. The patient tolerated the procedure well without immediate post procedural complication. FINDINGS: A total of approximately 5000 mL of straw-colored ascitic fluid was removed. IMPRESSION: Successful ultrasound-guided paracentesis yielding 5 liters of peritoneal fluid. Electronically Signed   By: Jacqulynn Cadet M.D.   On: 12/11/2017 11:01     Management plans discussed with the patient, family and they are in agreement.  CODE STATUS: Prior   TOTAL TIME TAKING CARE OF THIS PATIENT: 33 minutes.    Stanley [name] M.D on 12/11/2017 at 4:19 PM  Between 7am to 6pm - Pager [phone]  After 6pm go to [email or web address] - Proofreader  [facility] [place] [facility]  Office  [phone]  CC: Primary care physician; [name], MD   Note: This dictation was prepared with Dragon dictation along with smaller phrase technology. Any transcriptional errors that result from this process are unintentional.

## 2017-12-11 NOTE — Progress Notes (Signed)
[facility]  ROUNDING NOTE   Subjective:  Patient seen prior to leaving.  Resting comfortably.  Had paracentesis, 5 liters removed.   Objective:  Vital signs in last 24 hours:  Temp:  [97.7 F (36.5 C)-98.4 F (36.9 C)] 97.7 F (36.5 C) (09/30 0745) Pulse Rate:  [69-81] 81 (09/30 1041) Resp:  [18] 18 (09/30 0315) BP: (104-122)/(75-83) 122/75 (09/30 1041) SpO2:  [94 %-96 %] 94 % (09/30 1041) Weight:  [84.9 kg] 84.9 kg (09/30 0315)  Weight change: 3.266 kg Filed Weights   12/09/17 1541 12/10/17 0427 12/11/17 0315  Weight: 79.8 kg 79.6 kg 84.9 kg    Intake/Output: I/O last 3 completed shifts: In: 58 [P.O.:720] Out: 0    Intake/Output this shift:  Total I/O In: 120 [P.O.:120] Out: -   Physical Exam: General: NAD, laying in bed  Head: Normocephalic, atraumatic. Moist oral mucosal membranes  Eyes: Anicteric,   Neck: Supple, trachea midline  Lungs:  Bilateral crackles, room air  Heart: S1S2 no rubs  Abdomen:  Soft, distended, BS present  Extremities:  trace peripheral edema.  Neurologic: Awake, alert, follows commands  Skin: No lesions  Access: RIJ permcath    Basic Metabolic Panel: Recent Labs  Lab 12/06/17 1813 12/09/17 0849 12/09/17 1159 12/10/17 0515  NA 141 142  --  141  K 5.2* 6.5*  --  4.7  CL 106 103  --  101  CO2 23 22  --  28  GLUCOSE 94 92  --  144*  BUN 46* 77*  --  43*  CREATININE 8.48* 12.26* 11.09* 8.17*  CALCIUM 8.8* 9.0  --  8.3*  PHOS  --   --  5.7*  --     Liver Function Tests: Recent Labs  Lab 12/06/17 1813  AST 32  ALT 18  ALKPHOS 137*  BILITOT 0.7  PROT 6.7  ALBUMIN 3.0*   No results for input(s): LIPASE, AMYLASE in the last 168 hours. No results for input(s): AMMONIA in the last 168 hours.  CBC: Recent Labs  Lab 12/06/17 1813 12/09/17 0849 12/09/17 1159 12/10/17 0515  WBC 5.8 7.8 7.2 6.0  HGB 9.0* 9.8* 9.6* 10.1*  HCT 29.1* 31.5* 30.5* 32.3*  MCV 80.9 82.3 80.9 81.0  PLT 238 288 304 268     Cardiac Enzymes: No results for input(s): CKTOTAL, CKMB, CKMBINDEX, TROPONINI in the last 168 hours.  BNP: Invalid input(s): POCBNP  CBG: Recent Labs  Lab 12/10/17 0834 12/11/17 0743  GLUCAP 125* 94    Microbiology: Results for orders placed or performed during the hospital encounter of 11/18/17  MRSA PCR Screening     Status: None   Collection Time: 11/19/17  2:01 AM  Result Value Ref Range Status   MRSA by PCR NEGATIVE NEGATIVE Final    Comment:        The GeneXpert MRSA Assay (FDA approved for NASAL specimens only), is one component of a comprehensive MRSA colonization surveillance program. It is not intended to diagnose MRSA infection nor to guide or monitor treatment for MRSA infections. Performed at [facility], [address] [place], [place] [address]     Coagulation Studies: No results for input(s): LABPROT, INR in the last 72 hours.  Urinalysis: No results for input(s): COLORURINE, LABSPEC, PHURINE, GLUCOSEU, HGBUR, BILIRUBINUR, KETONESUR, PROTEINUR, UROBILINOGEN, NITRITE, LEUKOCYTESUR in the last 72 hours.  Invalid input(s): APPERANCEUR    Imaging: US Paracentesis  Result Date: 12/11/2017 INDICATION: 57 year old male with alcoholic cirrhosis and recurrent large volume ascites.  He presents today for routine paracentesis. EXAM: ULTRASOUND GUIDED  PARACENTESIS MEDICATIONS: None. COMPLICATIONS: None immediate. PROCEDURE: Informed written consent was obtained from the patient after a discussion of the risks, benefits and alternatives to treatment. A timeout was performed prior to the initiation of the procedure. Initial ultrasound scanning demonstrates a large amount of ascites within the right lower abdominal quadrant. The right lower abdomen was prepped and draped in the usual sterile fashion. 1% lidocaine with epinephrine was used for local anesthesia. Following this, a 6 Fr Safe-T-Centesis catheter was introduced. An ultrasound image was saved  for documentation purposes. The paracentesis was performed. The catheter was removed and a dressing was applied. The patient tolerated the procedure well without immediate post procedural complication. FINDINGS: A total of approximately 5000 mL of straw-colored ascitic fluid was removed. IMPRESSION: Successful ultrasound-guided paracentesis yielding 5 liters of peritoneal fluid. Electronically Signed   By: Jacqulynn Cadet M.D.   On: 12/11/2017 11:01     Medications:    . calcium acetate  2,001 mg Oral TID WC  . Chlorhexidine Gluconate Cloth  6 each Topical Q0600  . docusate sodium  100 mg Oral BID  . epoetin (EPOGEN/PROCRIT) injection  4,000 Units Intravenous Q T,Th,Sa-HD  . ferrous sulfate  325 mg Oral TID WC  . folic acid  1 mg Oral Daily  . gabapentin  300 mg Oral BID  . heparin  5,000 Units Subcutaneous Q8H  . labetalol  100 mg Oral Daily  . mometasone-formoterol  2 puff Inhalation BID  . multivitamin  1 tablet Oral QHS  . pantoprazole  40 mg Oral Daily  . tiotropium  18 mcg Inhalation Daily     Assessment/ Plan:  [name] is a 57 y.o. black male withend stage renal disease on hemodialysis secondary to Alport's syndrome, ascites, hypertension, anemia of chronic kidney disease, coronary artery disease, peripheral vascular disease, hyperlipidemia, gastrointestinal AVMs, pulmonary hypertension  [facility]   1.  End stage renal diseasewith hyperkalemia:   - Patient will be due for hemodialysis again tomorrow as an outpatient.  He has history of hyperkalemia.  Most recent serum potassium was 4.7.  2.  Acute pulm edema/Volume overload - 2.7 L of ultrafiltration was performed on Saturday.  Next dialysis session tomorrow.  3.   Ascites  Patient underwent paracentesis on September 16 as well as today.  5 L removed today.  Continue to monitor.  4.  Anemia of chronic kidney disease:   - EPO with HD treatment, hemoglobin 10.1.  5.  Secondary  hyperparathyroidism - serum phosphorous close to target at 5.7.  Maintain the patient on calcium acetate.   LOS: 2 Stanley [name] 9/30/201912:15 PM

## 2017-12-12 NOTE — Discharge Summary (Signed)
[facility] [place] NAME: Stanley [name]    MR#:  [id number]  DATE OF BIRTH:  [birth year]  DATE OF ADMISSION:  11/18/2017 ADMITTING PHYSICIAN: Saundra Shelling, MD  DATE OF DISCHARGE: 11/20/2017  3:53 PM  PRIMARY CARE PHYSICIAN: Alene Mires Elyse Jarvis, MD   ADMISSION DIAGNOSIS:  Hyperkalemia [E87.5] Pain of left lower extremity [M79.605]  DISCHARGE DIAGNOSIS:  Active Problems:   Hyperkalemia   SECONDARY DIAGNOSIS:   Past Medical History:  Diagnosis Date  . Alcohol abuse   . CHF (congestive heart failure) ([place])   . Cirrhosis ([place])   . Coronary artery disease 2009  . Drug abuse ([place])   . End stage renal disease on dialysis [facility]) NEPHROLOGIST-   DR [facility]  IN [place]   HEMODIALYSIS --   TUES/  THURS/  SAT  . Gastrointestinal bleed 06/13/2017   From chart...hx of multiple GI bleeds  . GERD (gastroesophageal reflux disease)   . Hyperlipidemia   . Hypertension   . PAD (peripheral artery disease) ([place])   . Renal insufficiency    Per pt, 32 oz fluid restriction per day  . S/P triple vessel bypass 06/09/2016   2009ish  . Suicidal ideation    & HOMICIDAL IDEATION --  06-16-2013   ADMITTED TO BEHAVIOR HEALTH     ADMITTING HISTORY  HISTORY OF PRESENT ILLNESS: Stanley [name]  is a 57 y.o. male with a known history of cirrhosis of liver, chronic diastolic heart failure, end-stage renal disease on dialysis, COPD, coronary artery disease presented to the emergency room with lower extremity edema secondary to fluid retention.  Patient also complains of abdominal bloating secondary to fluid.  He recently was discharged from our hospital.  He had abdominal paracentesis is 6.3 L of ascitic fluid was removed.  He was evaluated in the emergency room he missed his dialysis today.  His potassium is 6.8 and patient was given oral [place], IV sodium bicarbonate, IV calcium gluconate and dextrose in the emergency room.  Hospitalist service was consulted  for further care.  Patient also complains of some pain in the left lower extremity.  Pain is aching in nature 3 out of 10 on a scale of 1-10.  Pending Doppler ultrasound of the left lower extremity.  HOSPITAL COURSE:   *End-stage renal disease with noncompliance and significant fluid overload and associated hyperkalemia *Cirrhosis and ascites *Tobacco abuse *Chronic diastolic congestive heart failure *Chronic left foot pain  Patient was admitted for inpatient hemodialysis due to significant fluid overload.  Improved well.  Continue to have some ascites.  Patient gets frequent paracentesis and had recent 6 L drained.  Improved to baseline with hemodialysis and patient and requested to be discharged back home.  High risk for readmission due to noncompliance.  Patient has chronic left foot pain which has been there for many years and is unchanged.  Stable for discharge.  CONSULTS OBTAINED:  Treatment Team:  Murlean Iba, MD Hillary Bow, MD Delana Meyer Dolores Lory, MD  DRUG ALLERGIES:  No Known Allergies  DISCHARGE MEDICATIONS:   Allergies as of 11/20/2017   No Known Allergies     Medication List    TAKE these medications   budesonide-formoterol 160-4.5 MCG/ACT inhaler Commonly known as:  SYMBICORT Inhale 2 puffs into the lungs daily.   calcium acetate 667 MG capsule Commonly known as:  PHOSLO Take 2,001 mg by mouth 3 (three) times daily.   ferrous sulfate 325 (65 FE) MG tablet Take 1 tablet by mouth  3 (three) times daily.   folic acid 1 MG tablet Commonly known as:  FOLVITE Take 1 tablet (1 mg total) by mouth daily.   gabapentin 300 MG capsule Commonly known as:  NEURONTIN Take 300 mg by mouth 2 (two) times daily.   ipratropium-albuterol 0.5-2.5 (3) MG/3ML Soln Commonly known as:  DUONEB Take 3 mLs by nebulization every 6 (six) hours as needed.   labetalol 100 MG tablet Commonly known as:  NORMODYNE Take 100 mg by mouth daily.   multivitamin Tabs tablet Take 1  tablet by mouth at bedtime.   nitroGLYCERIN 0.4 MG SL tablet Commonly known as:  NITROSTAT Place 1 tablet (0.4 mg total) under the tongue every 5 (five) minutes as needed.   omeprazole 40 MG capsule Commonly known as:  PRILOSEC Take 40 mg by mouth daily.   Spacer/Aero Chamber Mouthpiece Misc 1 Units by Does not apply route every 4 (four) hours as needed (wheezing).   tiotropium 18 MCG inhalation capsule Commonly known as:  SPIRIVA Place 1 capsule (18 mcg total) into inhaler and inhale daily.       Today   VITAL SIGNS:  Blood pressure 111/64, pulse 76, temperature 98.6 F (37 C), temperature source Oral, resp. rate 18, height 6\' 3"  (1.905 m), weight 83 kg, SpO2 97 %.  I/O:  No intake or output data in the 24 hours ending 12/12/17 1454  PHYSICAL EXAMINATION:  Physical Exam  GENERAL:  57 y.o.-year-old patient lying in the bed with no acute distress.  LUNGS: Normal breath sounds bilaterally, no wheezing, rales,rhonchi or crepitation. No use of accessory muscles of respiration.  CARDIOVASCULAR: S1, S2 normal. No murmurs, rubs, or gallops.  ABDOMEN: Soft, non-tender, non-distended. Bowel sounds present. No organomegaly or mass.  NEUROLOGIC: Moves all 4 extremities. PSYCHIATRIC: The patient is alert and oriented x 3.  SKIN: No obvious rash, lesion, or ulcer.   DATA REVIEW:   CBC Recent Labs  Lab 12/10/17 0515  WBC 6.0  HGB 10.1*  HCT 32.3*  PLT 268    Chemistries  Recent Labs  Lab 12/06/17 1813  12/10/17 0515  NA 141   < > 141  K 5.2*   < > 4.7  CL 106   < > 101  CO2 23   < > 28  GLUCOSE 94   < > 144*  BUN 46*   < > 43*  CREATININE 8.48*   < > 8.17*  CALCIUM 8.8*   < > 8.3*  AST 32  --   --   ALT 18  --   --   ALKPHOS 137*  --   --   BILITOT 0.7  --   --    < > = values in this interval not displayed.    Cardiac Enzymes No results for input(s): TROPONINI in the last 168 hours.  Microbiology Results  Results for orders placed or performed during the  hospital encounter of 11/18/17  MRSA PCR Screening     Status: None   Collection Time: 11/19/17  2:01 AM  Result Value Ref Range Status   MRSA by PCR NEGATIVE NEGATIVE Final    Comment:        The GeneXpert MRSA Assay (FDA approved for NASAL specimens only), is one component of a comprehensive MRSA colonization surveillance program. It is not intended to diagnose MRSA infection nor to guide or monitor treatment for MRSA infections. Performed at [facility], [address] [place], [place] [address]  RADIOLOGY:  US Paracentesis  Result Date: 12/11/2017 INDICATION: 57 year old male with alcoholic cirrhosis and recurrent large volume ascites. He presents today for routine paracentesis. EXAM: ULTRASOUND GUIDED  PARACENTESIS MEDICATIONS: None. COMPLICATIONS: None immediate. PROCEDURE: Informed written consent was obtained from the patient after a discussion of the risks, benefits and alternatives to treatment. A timeout was performed prior to the initiation of the procedure. Initial ultrasound scanning demonstrates a large amount of ascites within the right lower abdominal quadrant. The right lower abdomen was prepped and draped in the usual sterile fashion. 1% lidocaine with epinephrine was used for local anesthesia. Following this, a 6 Fr Safe-T-Centesis catheter was introduced. An ultrasound image was saved for documentation purposes. The paracentesis was performed. The catheter was removed and a dressing was applied. The patient tolerated the procedure well without immediate post procedural complication. FINDINGS: A total of approximately 5000 mL of straw-colored ascitic fluid was removed. IMPRESSION: Successful ultrasound-guided paracentesis yielding 5 liters of peritoneal fluid. Electronically Signed   By: Jacqulynn Cadet M.D.   On: 12/11/2017 11:01    Follow up with PCP in 1 week.  Management plans discussed with the patient, family and they are in agreement.  CODE  STATUS:  Code Status History    Date Active Date Inactive Code Status Order ID Comments User Context   12/09/2017 1043 12/11/2017 1617 Full Code [id number]  Epifanio Lesches, MD ED   11/27/2017 1122 11/28/2017 1827 Full Code [id number]  Demetrios Loll, MD Inpatient   11/18/2017 2008 11/20/2017 1853 Full Code [id number]  Saundra Shelling, MD Inpatient   10/30/2017 0007 10/31/2017 1724 Full Code [id number]  Amelia Jo, MD Inpatient   06/12/2017 1707 06/16/2017 1704 Full Code [id number]  Nicholes Mango, MD Inpatient   06/02/2017 1603 06/06/2017 2222 Full Code [id number]  Demetrios Loll, MD Inpatient   05/24/2017 1831 05/25/2017 1921 Full Code [id number]  Saundra Shelling, MD ED   05/03/2017 1331 05/05/2017 1806 Full Code [id number]  Bettey Costa, MD Inpatient   04/01/2017 0947 04/03/2017 1156 Full Code [id number]  Loletha Grayer, MD ED   03/09/2017 0010 03/09/2017 1928 Full Code [id number]  Lance Coon, MD Inpatient   03/03/2017 2027 03/04/2017 1920 Full Code [id number]  Demetrios Loll, MD Inpatient   02/28/2017 2254 03/02/2017 1519 Full Code [id number]  Lance Coon, MD Inpatient   02/22/2017 1721 02/24/2017 1359 Full Code [id number]  Dustin Flock, MD Inpatient   01/22/2017 2337 01/25/2017 1522 Full Code [id number]  Gorden Harms, MD Inpatient   01/07/2017 1445 01/13/2017 1812 Full Code [id number]  Idelle Crouch, MD Inpatient   12/22/2016 1431 12/24/2016 1804 Full Code [id number]  Hillary Bow, MD ED   12/20/2016 2020 12/21/2016 1853 Full Code [id number]  Demetrios Loll, MD Inpatient   11/26/2016 1030 11/28/2016 1522 Full Code [id number]  Henreitta Leber, MD Inpatient   11/15/2016 1930 11/16/2016 1822 Full Code [id number]  Fritzi Mandes, MD Inpatient   10/29/2016 1453 10/30/2016 1506 Full Code [id number]  Loletha Grayer, MD ED   09/15/2016 1757 09/16/2016 1817 Full Code [id number]  Dustin Flock, MD ED   08/16/2016 0945 08/18/2016 1816 Full Code [id number]  Hillary Bow, MD ED   08/09/2016 1659 08/12/2016 2034 Full Code [id number]  Fritzi Mandes, MD  Inpatient   07/03/2016 2154 07/05/2016 2122 Full Code [id number]  Demetrios Loll, MD Inpatient   06/24/2016 0423 06/26/2016 1546 Full Code [id number]  Saundra Shelling, MD Inpatient   06/14/2016 1453 06/19/2016 2104 Full Code [id number]  Hillary Bow, MD ED   05/31/2016 [phone] 06/01/2016 2130  Full Code [id number]  Saundra Shelling, MD Inpatient   05/05/2016 2309 05/10/2016 2157 Full Code [id number]  Vianne Bulls, MD Inpatient   04/16/2016 0356 04/17/2016 1827 Full Code [id number]  Saundra Shelling, MD Inpatient   01/19/2016 1513 01/22/2016 1414 Full Code [id number]  Loletha Grayer, MD ED   09/24/2015 1033 09/25/2015 1331 Full Code [id number]  Loletha Grayer, MD ED   05/16/2015 1417 05/17/2015 2057 Full Code [id number]  Idelle Crouch, MD Inpatient   04/30/2015 2008 05/08/2015 1940 Full Code [id number]  Vaughan Basta, MD Inpatient   04/06/2015 0507 04/07/2015 1736 Full Code [id number]  Lance Coon, MD Inpatient   03/23/2015 1107 03/27/2015 1246 Full Code [id number]  Bettey Costa, MD Inpatient   06/19/2013 1132 06/21/2013 1749 Full Code [id number]  Cristal Ford, DO Inpatient   06/16/2013 1726 06/19/2013 1132 Full Code [id number]  Margarita Mail, PA-C ED   06/05/2013 1533 06/06/2013 1856 Full Code [id number]  Theodoro Kos, DO Inpatient   06/02/2013 0901 06/05/2013 1533 Full Code [id number]  Velvet Bathe, MD Inpatient   06/02/2013 0840 06/02/2013 0901 Full Code [id number]  Velvet Bathe, MD Inpatient      TOTAL TIME TAKING CARE OF THIS PATIENT ON DAY OF DISCHARGE: more than 30 minutes.   Leia Alf Bejamin Hackbart M.D on 12/12/2017 at 2:54 PM  Between 7am to 6pm - Pager - [phone]  After 6pm go to [email or web address] - password [email or web address] [facility]  Office  [phone]  CC: Primary care physician; [name], MD  Note: This dictation was prepared with Dragon dictation along with smaller phrase technology. Any transcriptional errors that result from this process are unintentional.

## 2017-12-14 ENCOUNTER — Encounter (INDEPENDENT_AMBULATORY_CARE_PROVIDER_SITE_OTHER): Payer: Medicare Other | Admitting: Vascular Surgery

## 2017-12-14 ENCOUNTER — Encounter (INDEPENDENT_AMBULATORY_CARE_PROVIDER_SITE_OTHER): Payer: Medicare Other

## 2017-12-14 DIAGNOSIS — Z992 Dependence on renal dialysis: Secondary | ICD-10-CM | POA: Diagnosis not present

## 2017-12-14 DIAGNOSIS — D631 Anemia in chronic kidney disease: Secondary | ICD-10-CM | POA: Diagnosis not present

## 2017-12-14 DIAGNOSIS — Z23 Encounter for immunization: Secondary | ICD-10-CM | POA: Diagnosis not present

## 2017-12-14 DIAGNOSIS — N186 End stage renal disease: Secondary | ICD-10-CM | POA: Diagnosis not present

## 2017-12-14 DIAGNOSIS — N2581 Secondary hyperparathyroidism of renal origin: Secondary | ICD-10-CM | POA: Diagnosis not present

## 2017-12-14 DIAGNOSIS — D509 Iron deficiency anemia, unspecified: Secondary | ICD-10-CM | POA: Diagnosis not present

## 2017-12-16 DIAGNOSIS — Z992 Dependence on renal dialysis: Secondary | ICD-10-CM | POA: Diagnosis not present

## 2017-12-16 DIAGNOSIS — D631 Anemia in chronic kidney disease: Secondary | ICD-10-CM | POA: Diagnosis not present

## 2017-12-16 DIAGNOSIS — N186 End stage renal disease: Secondary | ICD-10-CM | POA: Diagnosis not present

## 2017-12-16 DIAGNOSIS — D509 Iron deficiency anemia, unspecified: Secondary | ICD-10-CM | POA: Diagnosis not present

## 2017-12-16 DIAGNOSIS — Z23 Encounter for immunization: Secondary | ICD-10-CM | POA: Diagnosis not present

## 2017-12-16 DIAGNOSIS — N2581 Secondary hyperparathyroidism of renal origin: Secondary | ICD-10-CM | POA: Diagnosis not present

## 2017-12-17 IMAGING — CR DG ABDOMEN 2V
3 series · 3 of 3 positions shown · non-contrast
Comparison: None.

CLINICAL DATA: Known liver disease.  Abdominal swelling.

EXAM:
ABDOMEN - 2 VIEW

[abdomen erect]
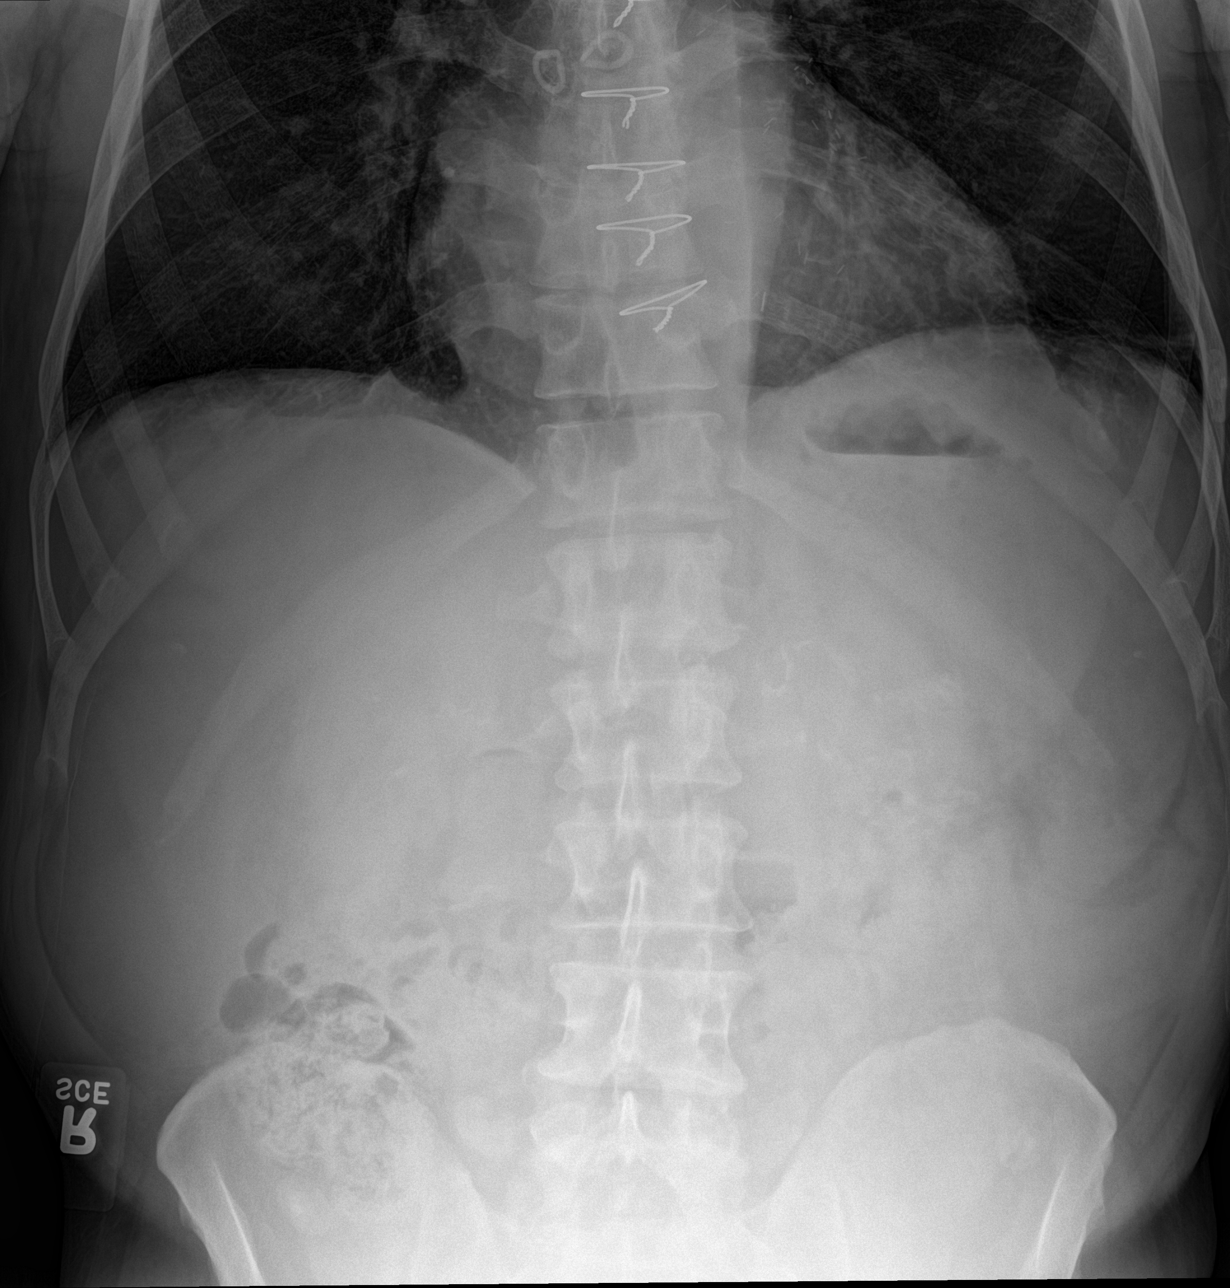

[abdomen supine (1 of 2)]
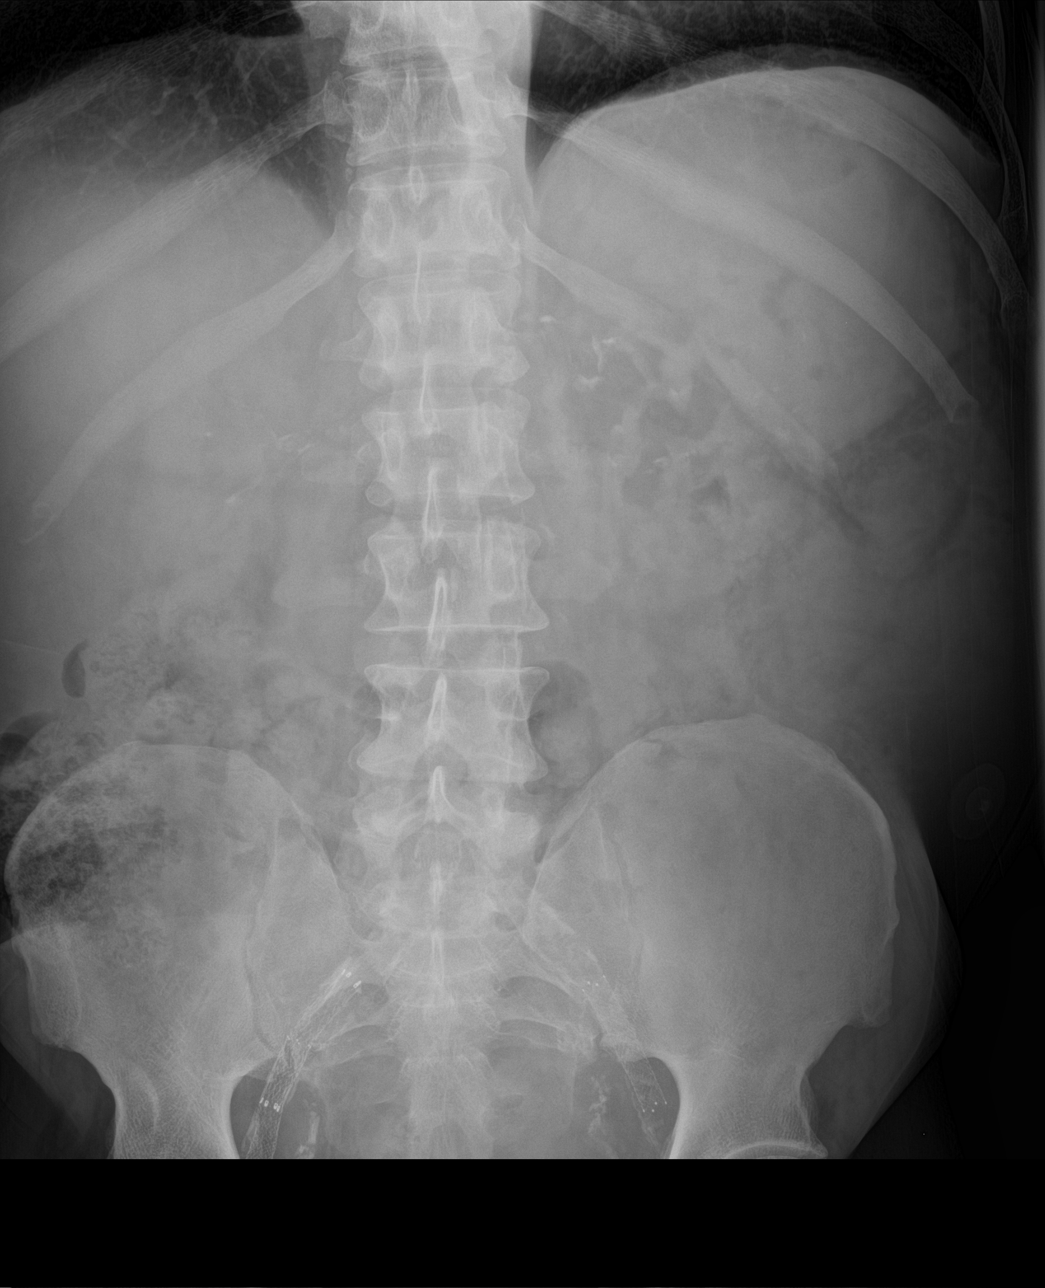

[abdomen supine (2 of 2)]
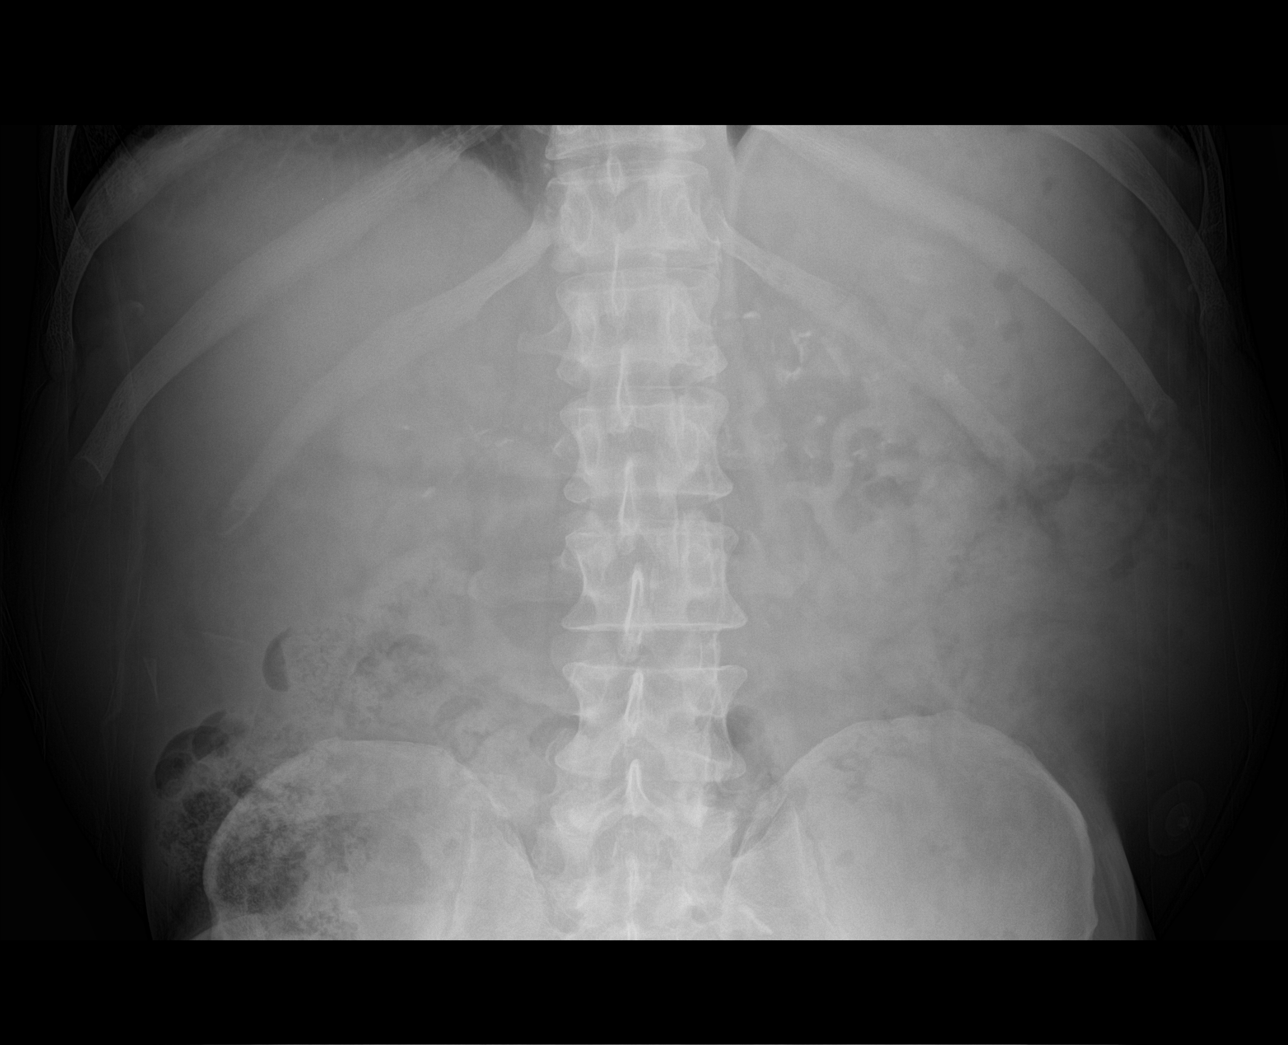

[3 of 3 positions shown; findings below may reference images not displayed]

FINDINGS: The lung bases are normal other than minimal atelectasis on the
left. No free air, portal venous gas, or pneumatosis. Vascular
stents are seen in the pelvis. Calcifications in the upper abdomen
may be vascular. Renal stones are not excluded. No bowel
obstruction.
IMPRESSION: No bowel obstruction.

Probable renal stones. Linear calcifications in the upper abdomen
are probably vascular.

## 2017-12-19 DIAGNOSIS — Z23 Encounter for immunization: Secondary | ICD-10-CM | POA: Diagnosis not present

## 2017-12-19 DIAGNOSIS — D631 Anemia in chronic kidney disease: Secondary | ICD-10-CM | POA: Diagnosis not present

## 2017-12-19 DIAGNOSIS — D509 Iron deficiency anemia, unspecified: Secondary | ICD-10-CM | POA: Diagnosis not present

## 2017-12-19 DIAGNOSIS — Z992 Dependence on renal dialysis: Secondary | ICD-10-CM | POA: Diagnosis not present

## 2017-12-19 DIAGNOSIS — N2581 Secondary hyperparathyroidism of renal origin: Secondary | ICD-10-CM | POA: Diagnosis not present

## 2017-12-19 DIAGNOSIS — N186 End stage renal disease: Secondary | ICD-10-CM | POA: Diagnosis not present

## 2017-12-20 ENCOUNTER — Emergency Department
Admission: EM | Admit: 2017-12-20 | Discharge: 2017-12-20 | Discharge status: Against Medical Advice | Payer: Medicare Other | Attending: Emergency Medicine | Admitting: Emergency Medicine

## 2017-12-20 ENCOUNTER — Encounter: Payer: Self-pay | Admitting: Emergency Medicine

## 2017-12-20 DIAGNOSIS — F17210 Nicotine dependence, cigarettes, uncomplicated: Secondary | ICD-10-CM | POA: Insufficient documentation

## 2017-12-20 DIAGNOSIS — J449 Chronic obstructive pulmonary disease, unspecified: Secondary | ICD-10-CM | POA: Diagnosis not present

## 2017-12-20 DIAGNOSIS — N186 End stage renal disease: Secondary | ICD-10-CM | POA: Diagnosis not present

## 2017-12-20 DIAGNOSIS — T50904A Poisoning by unspecified drugs, medicaments and biological substances, undetermined, initial encounter: Secondary | ICD-10-CM | POA: Diagnosis not present

## 2017-12-20 DIAGNOSIS — I509 Heart failure, unspecified: Secondary | ICD-10-CM | POA: Insufficient documentation

## 2017-12-20 DIAGNOSIS — R253 Fasciculation: Secondary | ICD-10-CM | POA: Diagnosis not present

## 2017-12-20 DIAGNOSIS — Z992 Dependence on renal dialysis: Secondary | ICD-10-CM | POA: Diagnosis not present

## 2017-12-20 DIAGNOSIS — E785 Hyperlipidemia, unspecified: Secondary | ICD-10-CM | POA: Insufficient documentation

## 2017-12-20 DIAGNOSIS — I132 Hypertensive heart and chronic kidney disease with heart failure and with stage 5 chronic kidney disease, or end stage renal disease: Secondary | ICD-10-CM | POA: Diagnosis not present

## 2017-12-20 DIAGNOSIS — I2510 Atherosclerotic heart disease of native coronary artery without angina pectoris: Secondary | ICD-10-CM | POA: Diagnosis not present

## 2017-12-20 DIAGNOSIS — Z79899 Other long term (current) drug therapy: Secondary | ICD-10-CM | POA: Diagnosis not present

## 2017-12-20 DIAGNOSIS — T4275XA Adverse effect of unspecified antiepileptic and sedative-hypnotic drugs, initial encounter: Secondary | ICD-10-CM | POA: Diagnosis not present

## 2017-12-20 DIAGNOSIS — T50905A Adverse effect of unspecified drugs, medicaments and biological substances, initial encounter: Secondary | ICD-10-CM

## 2017-12-20 LAB — COMPREHENSIVE METABOLIC PANEL
ALT: 19 U/L (ref 0–44)
AST: 21 U/L (ref 15–41)
Albumin: 2.8 g/dL — ABNORMAL LOW (ref 3.5–5.0)
Alkaline Phosphatase: 113 U/L (ref 38–126)
Anion gap: 11 (ref 5–15)
BUN: 37 mg/dL — AB (ref 6–20)
CO2: 26 mmol/L (ref 22–32)
CREATININE: 8.6 mg/dL — AB (ref 0.61–1.24)
Calcium: 8.7 mg/dL — ABNORMAL LOW (ref 8.9–10.3)
Chloride: 104 mmol/L (ref 98–111)
GFR, EST AFRICAN AMERICAN: 7 mL/min — AB (ref >60)
GFR, EST NON AFRICAN AMERICAN: 6 mL/min — AB (ref >60)
Glucose, Bld: 124 mg/dL — ABNORMAL HIGH (ref 70–99)
Potassium: 5.9 mmol/L — ABNORMAL HIGH (ref 3.5–5.1)
Sodium: 141 mmol/L (ref 135–145)
TOTAL PROTEIN: 6.2 g/dL — AB (ref 6.5–8.1)
Total Bilirubin: 0.4 mg/dL (ref 0.3–1.2)

## 2017-12-20 LAB — CBC WITH DIFFERENTIAL/PLATELET
Abs Immature Granulocytes: 0.05 10*3/uL (ref 0.00–0.07)
Basophils Absolute: 0 10*3/uL (ref 0.0–0.1)
Basophils Relative: 1 %
EOS ABS: 0.2 10*3/uL (ref 0.0–0.5)
Eosinophils Relative: 2 %
HEMATOCRIT: 35.2 % — AB (ref 39.0–52.0)
Hemoglobin: 9.7 g/dL — ABNORMAL LOW (ref 13.0–17.0)
Immature Granulocytes: 1 %
Lymphocytes Relative: 11 %
Lymphs Abs: 0.8 10*3/uL (ref 0.7–4.0)
MCH: 24.6 pg — AB (ref 26.0–34.0)
MCHC: 27.6 g/dL — ABNORMAL LOW (ref 30.0–36.0)
MCV: 89.1 fL (ref 80.0–100.0)
MONO ABS: 0.9 10*3/uL (ref 0.1–1.0)
MONOS PCT: 12 %
NEUTROS ABS: 5.5 10*3/uL (ref 1.7–7.7)
NEUTROS PCT: 73 %
Platelets: 285 10*3/uL (ref 150–400)
RBC: 3.95 MIL/uL — AB (ref 4.22–5.81)
RDW: 23.1 % — ABNORMAL HIGH (ref 11.5–15.5)
Smear Review: NORMAL
WBC: 7.4 10*3/uL (ref 4.0–10.5)
nRBC: 0 % (ref 0.0–0.2)

## 2017-12-20 IMAGING — US US ABDOMEN COMPLETE
1 series · 14 of 25 positions shown · non-contrast
Comparison: None

CLINICAL DATA: Fevers

EXAM:
ABDOMEN ULTRASOUND COMPLETE

[Series 1: us abdomen complete · 0.22mm/px · 14 of 113 slices shown]
[im 1/113]
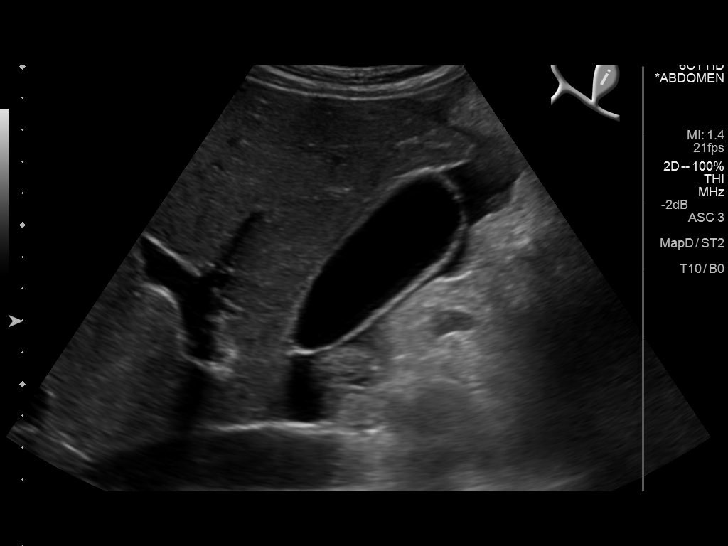
[im 10/113]
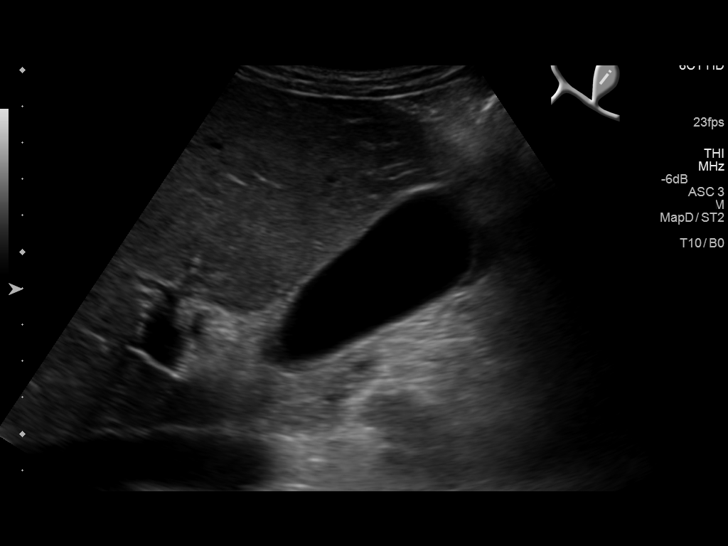
[im 19/113]
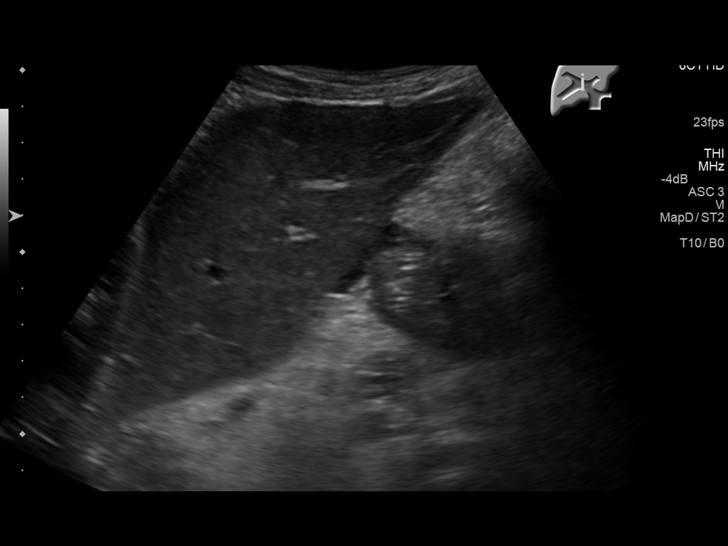
[im 29/113]
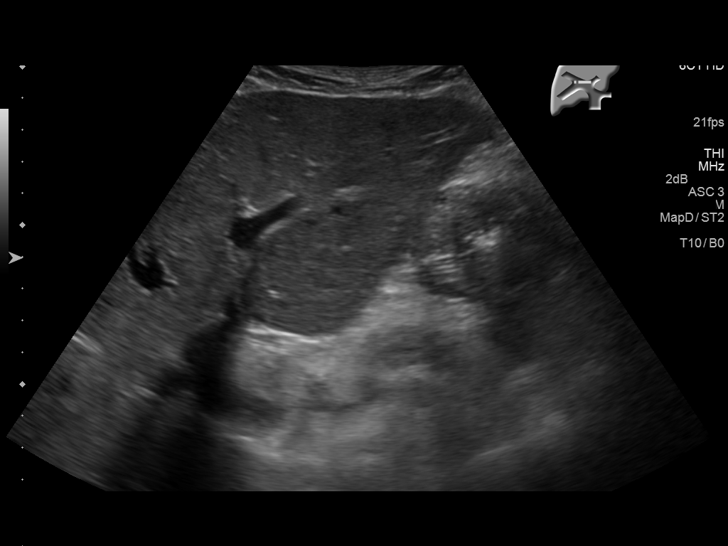
[im 38/113]
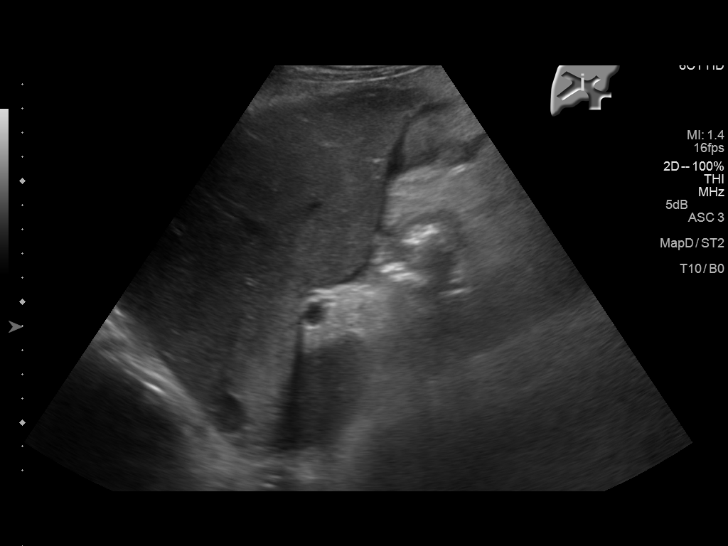
[im 43/113]
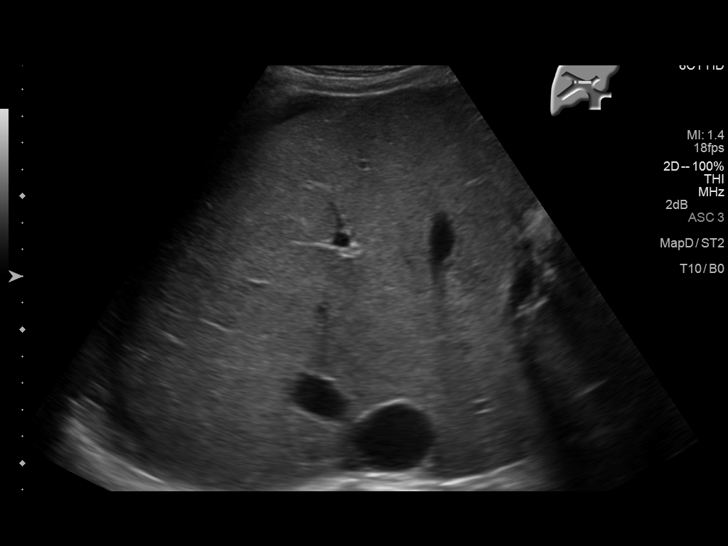
[im 52/113]
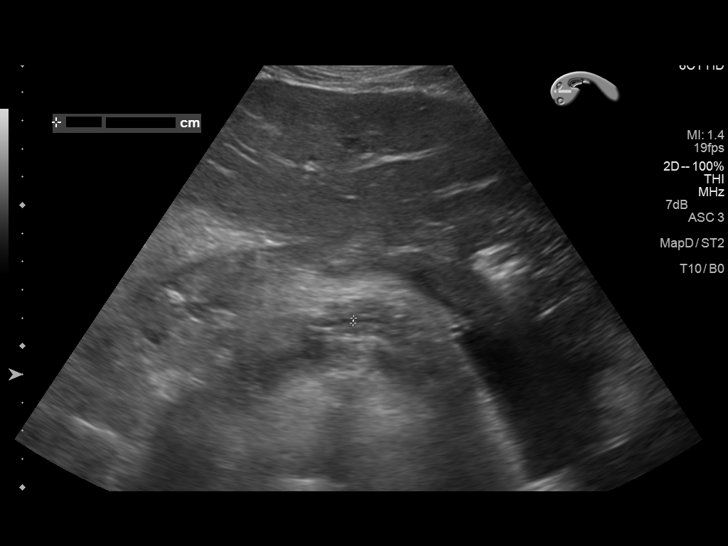
[im 61/113]
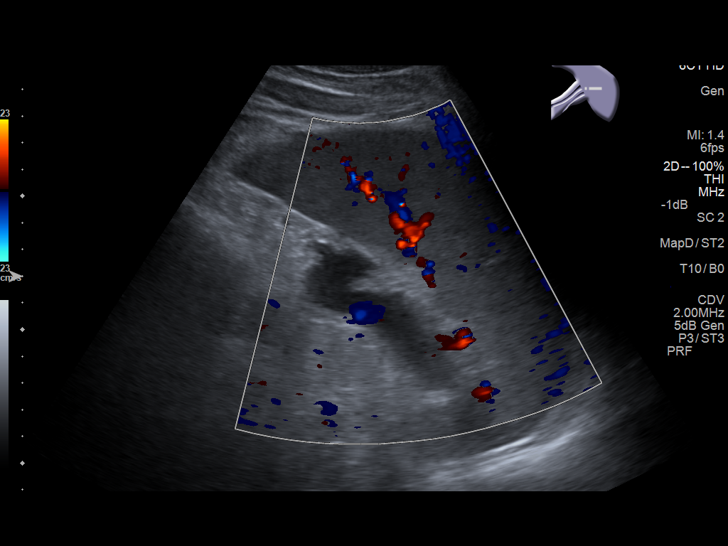
[im 71/113]
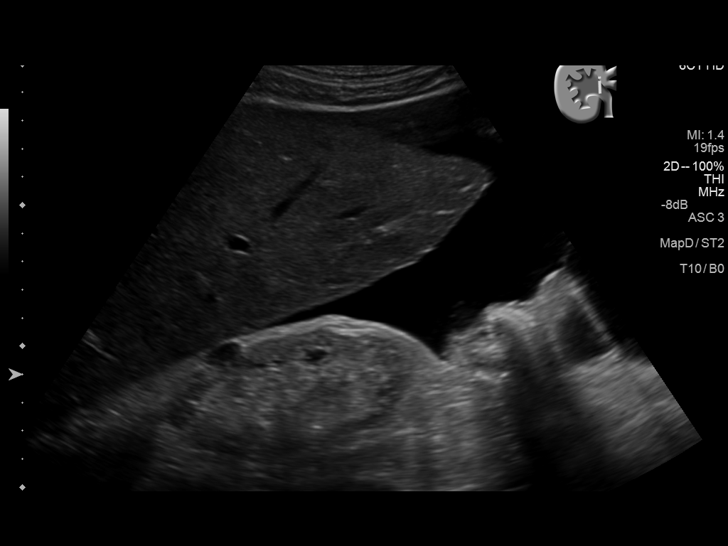
[im 75/113]
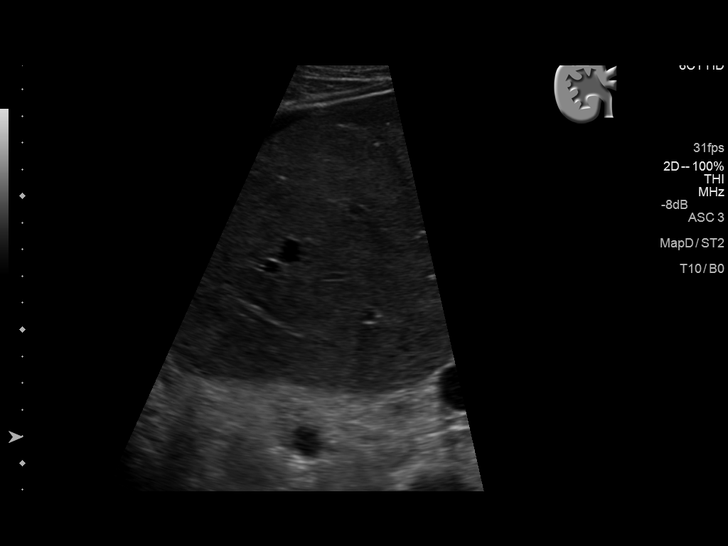
[im 85/113]
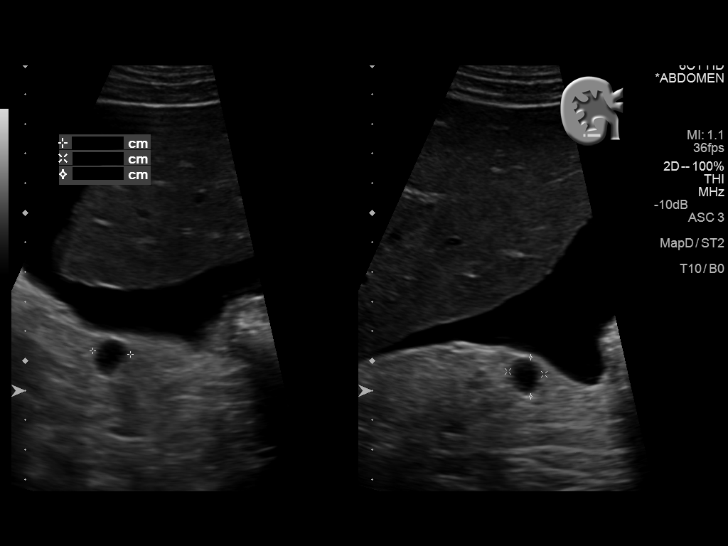
[im 94/113]
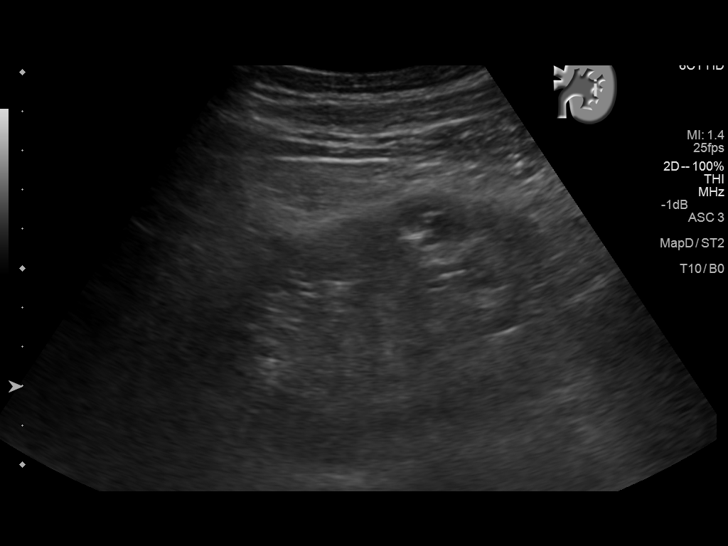
[im 103/113]
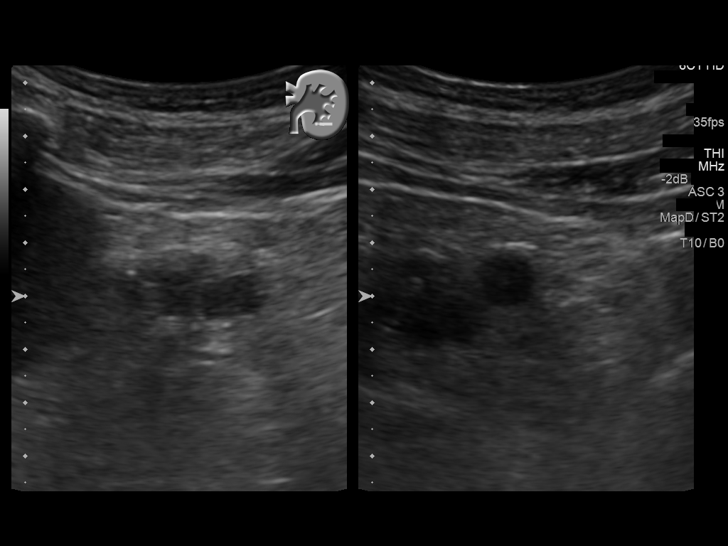
[im 113/113]
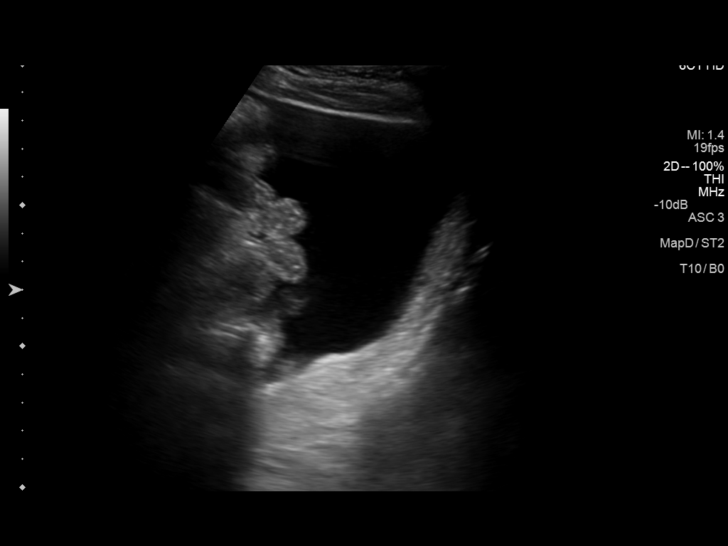

[14 of 25 positions shown; findings below may reference images not displayed]

FINDINGS: Gallbladder: No gallstones or wall thickening visualized. No
sonographic Murphy sign noted by sonographer.

Common bile duct: Diameter: 4.0 mm

Liver: No focal lesion identified. Within normal limits in
parenchymal echogenicity.

IVC: No abnormality visualized.

Pancreas: Not well visualized due to overlying bowel gas.

Spleen: Size and appearance within normal limits.

Right Kidney: Length: 8.4 cm.. Increased echogenicity is noted
consistent with medical renal disease. Multiple cysts are seen. The
largest of these lies in the lower pole measuring 1.3 cm.

Left Kidney: Length: 9.6 cm.. Increased echogenicity is noted
similar to that noted on the right. Cystic lesions are noted. The
largest of these measures 2 cm in the lower pole.

Abdominal aorta: No aneurysm visualized.

Other findings: Mild to moderate ascites is noted.
IMPRESSION: Mild to moderate ascites.

Changes consistent with medical renal disease

No other focal abnormality is noted.

## 2017-12-20 NOTE — ED Provider Notes (Signed)
[facility] Emergency Department Provider Note  ____________________________________________   I have reviewed the triage vital signs and the nursing notes. Where available I have reviewed prior notes and, if possible and indicated, outside hospital notes.    HISTORY  Chief Complaint Body Twitching    HPI Stanley [name] is a 57 y.o. male who presents today complaining of feeling twitchy after taking extra gabapentin a couple days ago.  He has no SI no HI he is adamant he was not trying to hurt himself.  He states that he has chronic foot pain and he wanted to see if it would help to take a few extra pills.  He thinks he took 7 over the course of the day.  He contracts for safety and states that he feels fine he has been taking them as directed since that time.  He states he just got angry about his foot pain and wanted to see if extra medications would help.  He states no one will give him Percocet.  Patient saw and told his dialysis doctor about this, Dr. Holley Raring, he has dialysis yesterday since that ingestion, however he still feels a little bit twitchy.  He wants to see if I can make that stop.  He is not complaining any focal numbness or weakness he just feels that his body has twitches sometimes.  Past Medical History:  Diagnosis Date  . Alcohol abuse   . CHF (congestive heart failure) ([place])   . Cirrhosis ([place])   . Coronary artery disease 2009  . Drug abuse ([place])   . End stage renal disease on dialysis [facility]) NEPHROLOGIST-   DR [facility]  IN [place]   HEMODIALYSIS --   TUES/  THURS/  SAT  . Gastrointestinal bleed 06/13/2017   From chart...hx of multiple GI bleeds  . GERD (gastroesophageal reflux disease)   . Hyperlipidemia   . Hypertension   . PAD (peripheral artery disease) ([place])   . Renal insufficiency    Per pt, 32 oz fluid restriction per day  . S/P triple vessel bypass 06/09/2016   2009ish  . Suicidal ideation    & HOMICIDAL IDEATION --   06-16-2013   ADMITTED TO BEHAVIOR HEALTH    Patient Active Problem List   Diagnosis Date Noted  . Acute respiratory failure ([place]) 10/29/2017  . ESRD (end stage renal disease) on dialysis ([place]) 07/28/2017  . Protein-calorie malnutrition, severe 06/14/2017  . Encounter for dialysis [facility])   . Palliative care by specialist   . Goals of care, counseling/discussion   . Malnutrition of moderate degree 06/05/2017  . Secondary esophageal varices without bleeding ([place])   . Stomach irritation   . Idiopathic esophageal varices without bleeding ([place])   . Alcoholic hepatitis with ascites 05/24/2017  . ESRD (end stage renal disease) ([place]) 04/28/2017  . Uremia 03/08/2017  . ESRD on hemodialysis ([place]) 03/03/2017  . Weakness 02/28/2017  . Hypocalcemia 02/22/2017  . Shortness of breath 11/26/2016  . COPD (chronic obstructive pulmonary disease) ([place]) 10/30/2016  . COPD exacerbation ([place]) 10/29/2016  . Anemia   . Heme positive stool   . Ulceration of intestine   . Benign neoplasm of transverse colon   . Acute gastrointestinal hemorrhage   . Esophageal candidiasis ([place])   . Angiodysplasia of intestinal tract   . Acute respiratory failure with hypoxia ([place]) 07/03/2016  . GI bleeding 06/24/2016  . Rectal bleeding 06/14/2016  . Anemia of chronic disease 06/01/2016  . MRSA carrier 06/01/2016  . Chronic  renal failure 05/23/2016  . Ischemic heart disease 05/23/2016  . Angiodysplasia of small intestine   . Melena   . Small bowel bleed not requiring more than 4 units of blood in 24 hours, ICU, or surgery   . Anemia due to chronic blood loss   . Abdominal pain 05/05/2016  . Acute posthemorrhagic anemia 04/17/2016  . Gastrointestinal bleed 04/17/2016  . History of esophagogastroduodenoscopy (EGD) 04/17/2016  . Elevated troponin 04/17/2016  . Alcohol abuse 04/17/2016  . Upper GI bleed 01/19/2016  . Blood in stool   . Angiodysplasia of stomach and duodenum with hemorrhage   . Gastritis   . Reflux  esophagitis   . GI bleed 05/16/2015  . Acute GI bleeding   . Symptomatic anemia 04/30/2015  . HTN (hypertension) 04/06/2015  . GERD (gastroesophageal reflux disease) 04/06/2015  . HLD (hyperlipidemia) 04/06/2015  . Dyspnea 04/06/2015  . Cirrhosis of liver with ascites ([place]) 04/06/2015  . Ascites 04/06/2015  . GIB (gastrointestinal bleeding) 03/23/2015  . Homicidal ideation 06/19/2013  . Suicidal intent 06/19/2013  . Homicidal ideations 06/19/2013  . Hyperkalemia 06/16/2013  . Mandible fracture ([place]) 06/05/2013  . Fracture, mandible ([place]) 06/02/2013  . Coronary atherosclerosis of native coronary artery 06/02/2013  . ESRD on dialysis ([place]) 06/02/2013  . Mandible open fracture ([place]) 06/02/2013    Past Surgical History:  Procedure Laterality Date  . A/V FISTULAGRAM Right 06/06/2017   Procedure: A/V FISTULAGRAM;  Surgeon: Katha Cabal, MD;  Location: [facility];  Service: Cardiovascular;  Laterality: Right;  . A/V SHUNT INTERVENTION N/A 06/06/2017   Procedure: A/V SHUNT INTERVENTION;  Surgeon: Katha Cabal, MD;  Location: [facility];  Service: Cardiovascular;  Laterality: N/A;  . AGILE CAPSULE N/A 06/19/2016   Procedure: AGILE CAPSULE;  Surgeon: [name], MD;  Location: [facility] ENDOSCOPY;  Service: Endoscopy;  Laterality: N/A;  . COLONOSCOPY WITH PROPOFOL N/A 06/18/2016   Procedure: COLONOSCOPY WITH PROPOFOL;  Surgeon: [name], MD;  Location: [facility] ENDOSCOPY;  Service: Endoscopy;  Laterality: N/A;  . COLONOSCOPY WITH PROPOFOL N/A 08/12/2016   Procedure: COLONOSCOPY WITH PROPOFOL;  Surgeon: [name], MD;  Location: [facility] ENDOSCOPY;  Service: Endoscopy;  Laterality: N/A;  . COLONOSCOPY WITH PROPOFOL N/A 05/05/2017   Procedure: COLONOSCOPY WITH PROPOFOL;  Surgeon: Manya Silvas, MD;  Location[facility] ENDOSCOPY;  Service: Endoscopy;  Laterality: N/A;  . CORONARY ANGIOPLASTY  ?   PT UNABLE TO TELL IF  BEFORE OR AFTER  CABG  . CORONARY ARTERY BYPASS GRAFT  2008   ([place] , [place])   3 VESSEL  . DIALYSIS FISTULA CREATION  LAST SURGERY  APPOX  2008  . ENTEROSCOPY N/A 05/10/2016   Procedure: ENTEROSCOPY;  Surgeon: Jerene Bears, MD;  Location: [place];  Service: Gastroenterology;  Laterality: N/A;  . ENTEROSCOPY N/A 08/12/2016   Procedure: ENTEROSCOPY;  Surgeon: [name], MD;  Location: [facility] ENDOSCOPY;  Service: Endoscopy;  Laterality: N/A;  . ENTEROSCOPY Left 06/03/2017   Procedure: ENTEROSCOPY;  Surgeon: Virgel Manifold, MD;  Location: [facility] ENDOSCOPY;  Service: Endoscopy;  Laterality: Left;  Procedure date will ultimately depend on when patient is medically optimized before the procedure, pending hemodialysis and blood transfusions etc. Will place on schedule and change depending on clinical status.   . ENTEROSCOPY N/A 06/05/2017   Procedure: ENTEROSCOPY;  Surgeon: Virgel Manifold, MD;  Location: [facility] ENDOSCOPY;  Service: Endoscopy;  Laterality: N/A;  . ENTEROSCOPY N/A 06/15/2017   Procedure: Push ENTEROSCOPY;  Surgeon: [name], MD;  Location: [facility] ENDOSCOPY;  Service: Endoscopy;  Laterality: N/A;  . ESOPHAGOGASTRODUODENOSCOPY N/A 05/07/2015   Procedure: ESOPHAGOGASTRODUODENOSCOPY (EGD);  Surgeon: Hulen Luster, MD;  Location: [facility] ENDOSCOPY;  Service: Endoscopy;  Laterality: N/A;  . ESOPHAGOGASTRODUODENOSCOPY (EGD) WITH PROPOFOL N/A 05/17/2015   Procedure: ESOPHAGOGASTRODUODENOSCOPY (EGD) WITH PROPOFOL;  Surgeon: [name], MD;  Location: [facility] ENDOSCOPY;  Service: Endoscopy;  Laterality: N/A;  . ESOPHAGOGASTRODUODENOSCOPY (EGD) WITH PROPOFOL N/A 01/20/2016   Procedure: ESOPHAGOGASTRODUODENOSCOPY (EGD) WITH PROPOFOL;  Surgeon: [name], MD;  Location: [facility] ENDOSCOPY;  Service: Endoscopy;  Laterality: N/A;  . ESOPHAGOGASTRODUODENOSCOPY (EGD) WITH PROPOFOL N/A 04/17/2016   Procedure: ESOPHAGOGASTRODUODENOSCOPY (EGD) WITH PROPOFOL;  Surgeon: Lin Landsman, MD;  Location: [facility] ENDOSCOPY;  Service: Gastroenterology;  Laterality: N/A;  .  ESOPHAGOGASTRODUODENOSCOPY (EGD) WITH PROPOFOL  05/09/2016   Procedure: ESOPHAGOGASTRODUODENOSCOPY (EGD) WITH PROPOFOL;  Surgeon: Jerene Bears, MD;  Location: [facility];  Service: Endoscopy;;  . ESOPHAGOGASTRODUODENOSCOPY (EGD) WITH PROPOFOL N/A 06/16/2016   Procedure: ESOPHAGOGASTRODUODENOSCOPY (EGD) WITH PROPOFOL;  Surgeon: [name], MD;  Location: [facility] ENDOSCOPY;  Service: Endoscopy;  Laterality: N/A;  . ESOPHAGOGASTRODUODENOSCOPY (EGD) WITH PROPOFOL N/A 05/05/2017   Procedure: ESOPHAGOGASTRODUODENOSCOPY (EGD) WITH PROPOFOL;  Surgeon: Manya Silvas, MD;  Location: [facility] ENDOSCOPY;  Service: Endoscopy;  Laterality: N/A;  . ESOPHAGOGASTRODUODENOSCOPY (EGD) WITH PROPOFOL N/A 06/15/2017   Procedure: ESOPHAGOGASTRODUODENOSCOPY (EGD) WITH PROPOFOL;  Surgeon: [name], MD;  Location: [facility] ENDOSCOPY;  Service: Endoscopy;  Laterality: N/A;  . GIVENS CAPSULE STUDY N/A 05/07/2016   Procedure: GIVENS CAPSULE STUDY;  Surgeon: Doran Stabler, MD;  Location: [facility];  Service: Endoscopy;  Laterality: N/A;  . MANDIBULAR HARDWARE REMOVAL N/A 07/29/2013   Procedure: REMOVAL OF ARCH BARS;  Surgeon: Theodoro Kos, DO;  Location: [place];  Service: Plastics;  Laterality: N/A;  . ORIF MANDIBULAR FRACTURE N/A 06/05/2013   Procedure: REPAIR OF MANDIBULAR FRACTURE x 2 with maxillo-mandibular fixation ;  Surgeon: Theodoro Kos, DO;  Location: [place];  Service: Plastics;  Laterality: N/A;  . PARACENTESIS    . PERIPHERAL ARTERIAL STENT GRAFT Left     Prior to Admission medications   Medication Sig Start Date End Date Taking? Authorizing Provider  budesonide-formoterol (SYMBICORT) 160-4.5 MCG/ACT inhaler Inhale 2 puffs into the lungs daily. 11/16/16   Vaughan Basta, MD  calcium acetate (PHOSLO) 667 MG capsule Take 2,001 mg by mouth 3 (three) times daily.    [provider]  ferrous sulfate 325 (65 FE) MG tablet Take 1 tablet by mouth 3 (three) times daily. 01/21/17    [provider]  folic acid (FOLVITE) 1 MG tablet Take 1 tablet (1 mg total) by mouth daily. 10/31/17   Fritzi Mandes, MD  gabapentin (NEURONTIN) 300 MG capsule Take 300 mg by mouth 2 (two) times daily.  08/16/16   [provider]  ipratropium-albuterol (DUONEB) 0.5-2.5 (3) MG/3ML SOLN Take 3 mLs by nebulization every 6 (six) hours as needed. 11/28/16   Loletha Grayer, MD  labetalol (NORMODYNE) 100 MG tablet Take 100 mg by mouth daily. 03/24/17   [provider]  multivitamin (RENA-VIT) TABS tablet Take 1 tablet by mouth at bedtime. 10/31/17   Fritzi Mandes, MD  nitroGLYCERIN (NITROSTAT) 0.4 MG SL tablet Place 1 tablet (0.4 mg total) under the tongue every 5 (five) minutes as needed. 04/12/16   Wende Bushy, MD  omeprazole (PRILOSEC) 40 MG capsule Take 40 mg by mouth daily. 09/08/17   [provider]  Spacer/Aero Chamber Mouthpiece MISC 1 Units by Does not apply route  every 4 (four) hours as needed (wheezing). 02/19/17   Darel Hong, MD  tiotropium (SPIRIVA HANDIHALER) 18 MCG inhalation capsule Place 1 capsule (18 mcg total) into inhaler and inhale daily. Patient not taking: Reported on 12/09/2017 11/16/16 12/09/17  Vaughan Basta, MD    Allergies Patient has no known allergies.  Family History  Problem Relation Age of Onset  . Colon cancer Mother   . Cancer Father   . Cancer Sister   . Kidney disease Brother     Social History Social History   Tobacco Use  . Smoking status: Current Every Day Smoker    Packs/day: 0.15    Years: 40.00    Pack years: 6.00    Types: Cigarettes  . Smokeless tobacco: Never Used  Substance Use Topics  . Alcohol use: No    Comment: pt reports quitting after learning about cirrhosis  . Drug use: No    Frequency: 7.0 times per week    Types: Marijuana, Cocaine    Review of Systems Constitutional: No fever/chills Eyes: No visual changes. ENT: No sore throat. No stiff neck no neck pain Cardiovascular: Denies chest  pain. Respiratory: Denies shortness of breath. Gastrointestinal:   no vomiting.  No diarrhea.  No constipation. Genitourinary: Negative for dysuria. Musculoskeletal: Negative lower extremity swelling Skin: Negative for rash. Neurological: Negative for severe headaches, focal weakness or numbness.   ____________________________________________   PHYSICAL EXAM:  VITAL SIGNS: ED Triage Vitals [12/20/17 1413]  Enc Vitals Group     BP 123/75     Pulse Rate 85     Resp 18     Temp 98.1 F (36.7 C)     Temp Source Oral     SpO2 98 %     Weight 180 lb (81.6 kg)     Height 6\' 3"  (1.905 m)     Head Circumference      Peak Flow      Pain Score 0     Pain Loc      Pain Edu?      Excl. in [place]?     Constitutional: Alert and oriented. Well appearing and in no acute distress. Eyes: Conjunctivae are normal Head: Atraumatic HEENT: No congestion/rhinnorhea. Mucous membranes are moist.  Oropharynx non-erythematous Neck:   Nontender with no meningismus, no masses, no stridor Cardiovascular: Normal rate, regular rhythm. Grossly normal heart sounds.  Good peripheral circulation. Respiratory: Normal respiratory effort.  No retractions. Lungs CTAB. Abdominal: Soft and nontender. No distention. No guarding no rebound Back:  There is no focal tenderness or step off.  there is no midline tenderness there are no lesions noted. there is no CVA tenderness  Musculoskeletal: No lower extremity tenderness, no upper extremity tenderness. No joint effusions, no DVT signs strong distal pulses no edema Neurologic:  Normal speech and language. No gross focal neurologic deficits are appreciated.  Patient does seem to have a resting tremor bilaterally but when he eats or tries to do something that distract him from it, when I am not specifically at bedside it seems to go away. Skin:  Skin is warm, dry and intact. No rash noted. Psychiatric: Mood and affect are normal. Speech and behavior are  normal.  ____________________________________________   LABS (all labs ordered are listed, but only abnormal results are displayed)  Labs Reviewed  CBC WITH DIFFERENTIAL/PLATELET - Abnormal; Notable for the following components:      Result Value   RBC 3.95 (*)    Hemoglobin 9.7 (*)  HCT 35.2 (*)    MCH 24.6 (*)    MCHC 27.6 (*)    RDW 23.1 (*)    All other components within normal limits  COMPREHENSIVE METABOLIC PANEL - Abnormal; Notable for the following components:   Potassium 5.9 (*)    Glucose, Bld 124 (*)    BUN 37 (*)    Creatinine, Ser 8.60 (*)    Calcium 8.7 (*)    Total Protein 6.2 (*)    Albumin 2.8 (*)    GFR calc non Af Amer 6 (*)    GFR calc Af Amer 7 (*)    All other components within normal limits    Pertinent labs  results that were available during my care of the patient were reviewed by me and considered in my medical decision making (see chart for details). ____________________________________________  EKG  I personally interpreted any EKGs ordered by me or triage  ____________________________________________  RADIOLOGY  Pertinent labs & imaging results that were available during my care of the patient were reviewed by me and considered in my medical decision making (see chart for details). If possible, patient and/or family made aware of any abnormal findings.  No results found. ____________________________________________    PROCEDURES  Procedure(s) performed: None  Procedures  Critical Care performed: None  ____________________________________________   INITIAL IMPRESSION / ASSESSMENT AND PLAN / ED COURSE  Pertinent labs & imaging results that were available during my care of the patient were reviewed by me and considered in my medical decision making (see chart for details).  Patient with a tremor that does not seem to be consistent, comes and goes based upon whether he is being watched, he states he did take some extra  gabapentin, he contracts for safety and is adamant that he has no SI or HI.  I explained to him the poison control, which I consulted, recommends that he be medically cleared with conservative measures, he became very angry and pulled off everything on his chest and arm and left.  Of note, he had no evidence of tremor at any time during this last conversation and he seemed to be generating it to the extent that I can tell.  Patient very well-known to this facility.  I did advise him his potassium was high and I wanted to give him something for his potassium but he refused.  He mumbles some obscenities at me and he pulled all of his leads off and stormed out of the building.  This is not unusual for this patient we know him very well and I personally seen him several times in the past and is not unusual for him to leave in this way.  We have no evidence to support that he is suicidal or that this was an intentional overdose to harm himself.    ____________________________________________   FINAL CLINICAL IMPRESSION(S) / ED DIAGNOSES  Final diagnoses:  None      This chart was dictated using voice recognition software.  Despite best efforts to proofread,  errors can occur which can change meaning.      Schuyler Amor, MD 12/20/17 [phone]

## 2017-12-20 NOTE — ED Notes (Signed)
Pt given sandwich and blankets.

## 2017-12-20 NOTE — ED Notes (Signed)
Nurse helped pt reposition in stretcher.

## 2017-12-20 NOTE — ED Notes (Signed)
Pt was given crackers and ginger ale.

## 2017-12-20 NOTE — ED Triage Notes (Signed)
PT arrived via ems from home. Pt took 7/8 300mg  gabapentin 2 days prior. Pt arrives today with complaints of body jerking/twitching. Pt goes to dialysis M/W/F and went yesterday.

## 2017-12-20 NOTE — ED Notes (Signed)
Spoke with MD Jimmye Norman, new orders for blood work

## 2017-12-20 NOTE — ED Notes (Signed)
Pt ambulatory in the hall. Nurse asking pt if everything is okay. Pt stating "they are mother fucking" going to do anything." Pt stating "I've waited to long."

## 2017-12-23 ENCOUNTER — Emergency Department: Payer: Medicare Other

## 2017-12-23 ENCOUNTER — Other Ambulatory Visit: Payer: Self-pay

## 2017-12-23 ENCOUNTER — Inpatient Hospital Stay
Admission: EM | Admit: 2017-12-23 | Discharge: 2017-12-26 | DRG: 682 | Discharge status: Against Medical Advice | Payer: Medicare Other | Attending: Family Medicine | Admitting: Family Medicine

## 2017-12-23 DIAGNOSIS — K7460 Unspecified cirrhosis of liver: Secondary | ICD-10-CM | POA: Diagnosis present

## 2017-12-23 DIAGNOSIS — F17210 Nicotine dependence, cigarettes, uncomplicated: Secondary | ICD-10-CM | POA: Diagnosis present

## 2017-12-23 DIAGNOSIS — Z9115 Patient's noncompliance with renal dialysis: Secondary | ICD-10-CM

## 2017-12-23 DIAGNOSIS — Z8719 Personal history of other diseases of the digestive system: Secondary | ICD-10-CM

## 2017-12-23 DIAGNOSIS — N2581 Secondary hyperparathyroidism of renal origin: Secondary | ICD-10-CM | POA: Diagnosis present

## 2017-12-23 DIAGNOSIS — K219 Gastro-esophageal reflux disease without esophagitis: Secondary | ICD-10-CM | POA: Diagnosis present

## 2017-12-23 DIAGNOSIS — J810 Acute pulmonary edema: Secondary | ICD-10-CM | POA: Diagnosis present

## 2017-12-23 DIAGNOSIS — R509 Fever, unspecified: Secondary | ICD-10-CM

## 2017-12-23 DIAGNOSIS — J449 Chronic obstructive pulmonary disease, unspecified: Secondary | ICD-10-CM | POA: Diagnosis not present

## 2017-12-23 DIAGNOSIS — Z992 Dependence on renal dialysis: Secondary | ICD-10-CM

## 2017-12-23 DIAGNOSIS — I5033 Acute on chronic diastolic (congestive) heart failure: Secondary | ICD-10-CM | POA: Diagnosis present

## 2017-12-23 DIAGNOSIS — I472 Ventricular tachycardia: Secondary | ICD-10-CM | POA: Diagnosis not present

## 2017-12-23 DIAGNOSIS — I2510 Atherosclerotic heart disease of native coronary artery without angina pectoris: Secondary | ICD-10-CM | POA: Diagnosis present

## 2017-12-23 DIAGNOSIS — R0602 Shortness of breath: Secondary | ICD-10-CM | POA: Diagnosis not present

## 2017-12-23 DIAGNOSIS — I120 Hypertensive chronic kidney disease with stage 5 chronic kidney disease or end stage renal disease: Secondary | ICD-10-CM | POA: Diagnosis not present

## 2017-12-23 DIAGNOSIS — R0600 Dyspnea, unspecified: Secondary | ICD-10-CM | POA: Diagnosis not present

## 2017-12-23 DIAGNOSIS — Z841 Family history of disorders of kidney and ureter: Secondary | ICD-10-CM

## 2017-12-23 DIAGNOSIS — Q8781 Alport syndrome: Secondary | ICD-10-CM

## 2017-12-23 DIAGNOSIS — Z8659 Personal history of other mental and behavioral disorders: Secondary | ICD-10-CM

## 2017-12-23 DIAGNOSIS — Z800 Family history of malignant neoplasm of digestive organs: Secondary | ICD-10-CM

## 2017-12-23 DIAGNOSIS — Z79899 Other long term (current) drug therapy: Secondary | ICD-10-CM

## 2017-12-23 DIAGNOSIS — N186 End stage renal disease: Secondary | ICD-10-CM | POA: Diagnosis not present

## 2017-12-23 DIAGNOSIS — I272 Pulmonary hypertension, unspecified: Secondary | ICD-10-CM | POA: Diagnosis present

## 2017-12-23 DIAGNOSIS — D631 Anemia in chronic kidney disease: Secondary | ICD-10-CM | POA: Diagnosis present

## 2017-12-23 DIAGNOSIS — R188 Other ascites: Secondary | ICD-10-CM | POA: Diagnosis present

## 2017-12-23 DIAGNOSIS — I739 Peripheral vascular disease, unspecified: Secondary | ICD-10-CM | POA: Diagnosis present

## 2017-12-23 DIAGNOSIS — I454 Nonspecific intraventricular block: Secondary | ICD-10-CM | POA: Diagnosis present

## 2017-12-23 DIAGNOSIS — Z5329 Procedure and treatment not carried out because of patient's decision for other reasons: Secondary | ICD-10-CM | POA: Diagnosis present

## 2017-12-23 DIAGNOSIS — I110 Hypertensive heart disease with heart failure: Secondary | ICD-10-CM | POA: Diagnosis present

## 2017-12-23 DIAGNOSIS — Z9119 Patient's noncompliance with other medical treatment and regimen: Secondary | ICD-10-CM

## 2017-12-23 DIAGNOSIS — I10 Essential (primary) hypertension: Secondary | ICD-10-CM | POA: Diagnosis not present

## 2017-12-23 DIAGNOSIS — E785 Hyperlipidemia, unspecified: Secondary | ICD-10-CM | POA: Diagnosis present

## 2017-12-23 DIAGNOSIS — Z7951 Long term (current) use of inhaled steroids: Secondary | ICD-10-CM

## 2017-12-23 DIAGNOSIS — Z951 Presence of aortocoronary bypass graft: Secondary | ICD-10-CM

## 2017-12-23 DIAGNOSIS — E875 Hyperkalemia: Secondary | ICD-10-CM | POA: Diagnosis present

## 2017-12-23 LAB — COMPREHENSIVE METABOLIC PANEL
ALK PHOS: 153 U/L — AB (ref 38–126)
ALT: UNDETERMINED U/L (ref 0–44)
ANION GAP: 11 (ref 5–15)
AST: UNDETERMINED U/L (ref 15–41)
Albumin: 3 g/dL — ABNORMAL LOW (ref 3.5–5.0)
BILIRUBIN TOTAL: UNDETERMINED mg/dL (ref 0.3–1.2)
BUN: 66 mg/dL — ABNORMAL HIGH (ref 6–20)
CO2: 22 mmol/L (ref 22–32)
Calcium: 8.5 mg/dL — ABNORMAL LOW (ref 8.9–10.3)
Chloride: 102 mmol/L (ref 98–111)
Creatinine, Ser: 12.13 mg/dL — ABNORMAL HIGH (ref 0.61–1.24)
GFR, EST AFRICAN AMERICAN: 5 mL/min — AB (ref >60)
GFR, EST NON AFRICAN AMERICAN: 4 mL/min — AB (ref >60)
Glucose, Bld: 125 mg/dL — ABNORMAL HIGH (ref 70–99)
POTASSIUM: UNDETERMINED mmol/L (ref 3.5–5.1)
Sodium: 135 mmol/L (ref 135–145)
TOTAL PROTEIN: 6.3 g/dL — AB (ref 6.5–8.1)

## 2017-12-23 LAB — CBC
HCT: 40.9 % (ref 39.0–52.0)
Hemoglobin: 11.3 g/dL — ABNORMAL LOW (ref 13.0–17.0)
MCH: 24.5 pg — AB (ref 26.0–34.0)
MCHC: 27.6 g/dL — AB (ref 30.0–36.0)
MCV: 88.7 fL (ref 80.0–100.0)
Platelets: 294 10*3/uL (ref 150–400)
RBC: 4.61 MIL/uL (ref 4.22–5.81)
RDW: 22.8 % — AB (ref 11.5–15.5)
WBC: 6.8 10*3/uL (ref 4.0–10.5)
nRBC: 0 % (ref 0.0–0.2)

## 2017-12-23 IMAGING — US US PARACENTESIS
1 series · 10 of 10 positions shown · non-contrast
Comparison: none

INDICATION: 55-year-old male with a history of cirrhosis. He presents for repeat
paracentesis given recurrent ascites

[Series 1: us paracentesis · 0.26mm/px · 10 of 10 slices shown]
[im 1/10]
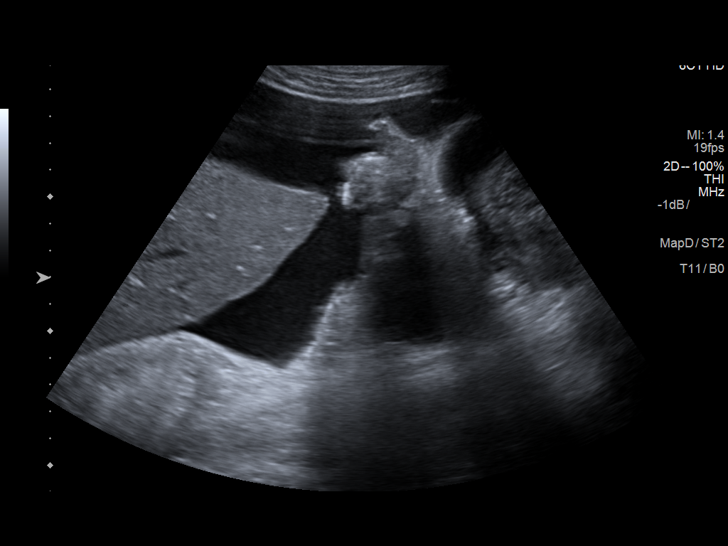
[im 2/10]
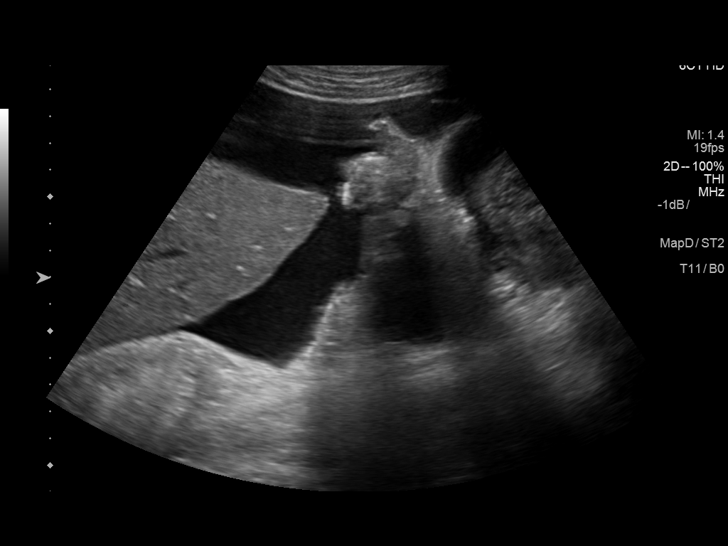
[im 3/10]
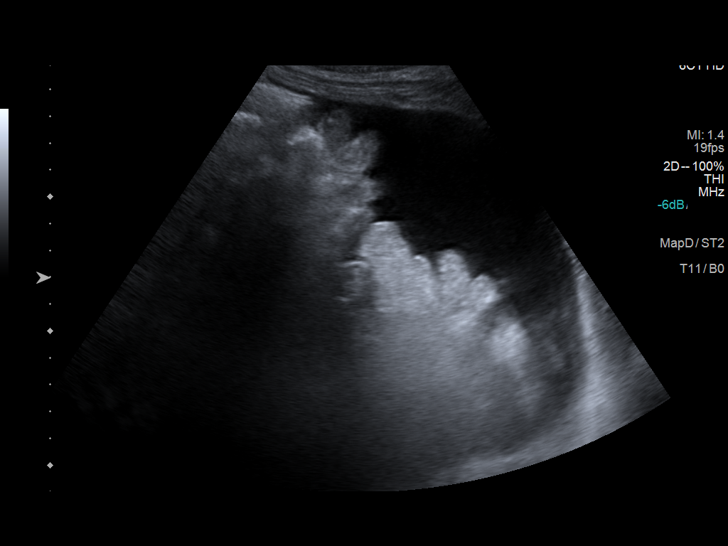
[im 4/10]
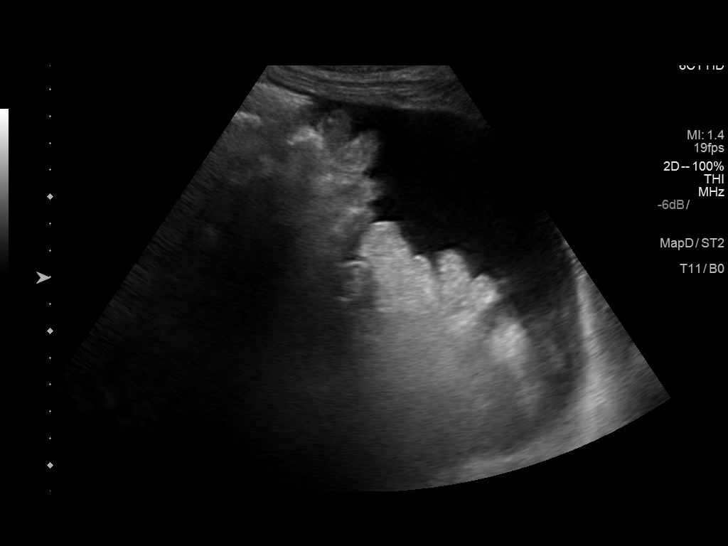
[im 5/10]
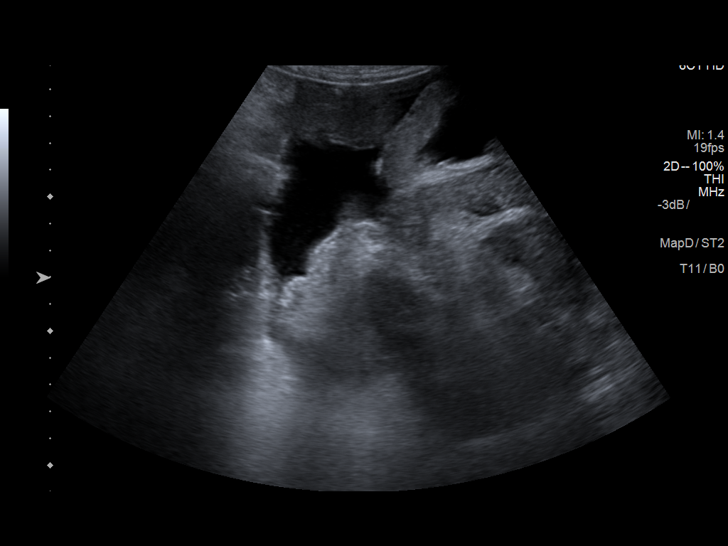
[im 6/10]
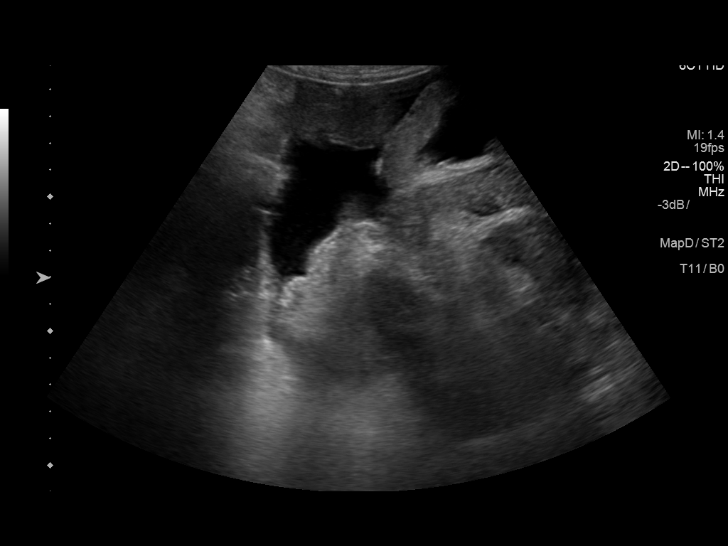
[im 7/10]
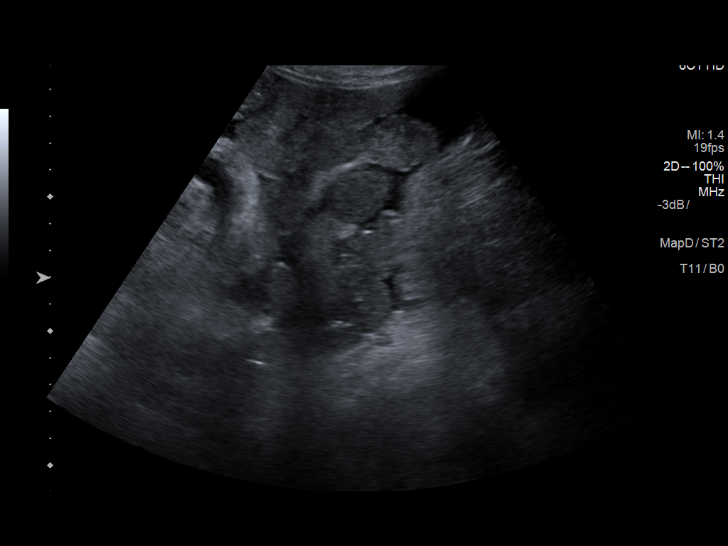
[im 8/10]
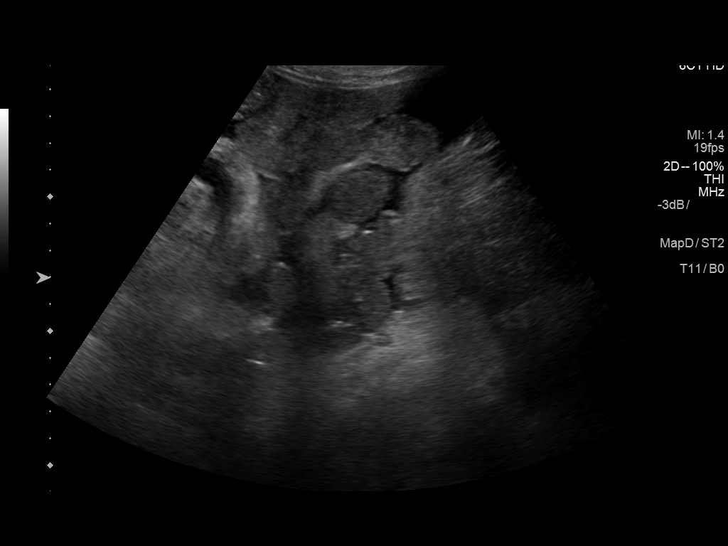
[im 9/10]
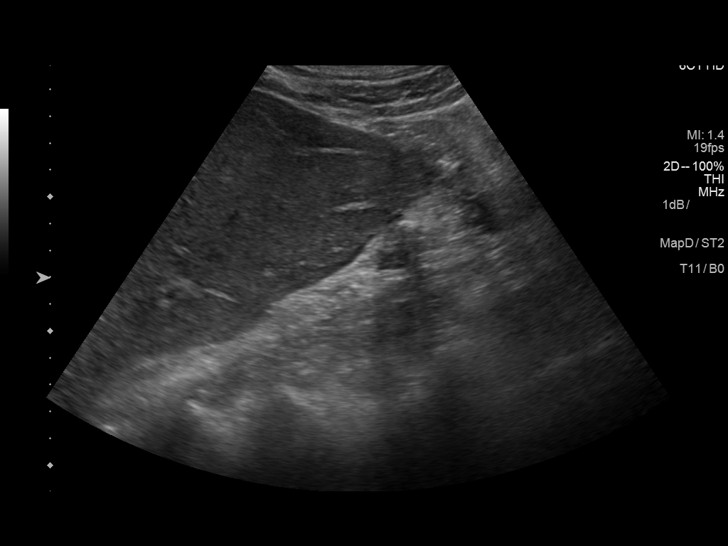
[im 10/10]
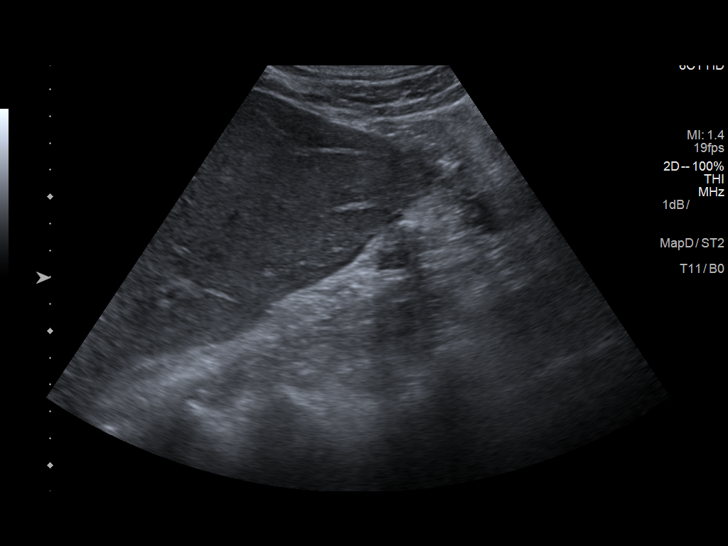

[10 of 10 positions shown; findings below may reference images not displayed]

EXAM:
ULTRASOUND GUIDED  PARACENTESIS

MEDICATIONS:
None.

COMPLICATIONS:
None

PROCEDURE:
Informed written consent was obtained from the patient after a
discussion of the risks, benefits and alternatives to treatment. A
timeout was performed prior to the initiation of the procedure.

Initial ultrasound scanning demonstrates a large amount of ascites
within the right lower abdominal quadrant. The right lower abdomen
was prepped and draped in the usual sterile fashion. 1% lidocaine
with epinephrine was used for local anesthesia.

Following this, a Safe-T-Centesis catheter catheter was introduced.
An ultrasound image was saved for documentation purposes. The
paracentesis was performed. The catheter was removed and a dressing
was applied. The patient tolerated the procedure well without
immediate post procedural complication.
FINDINGS: A total of approximately 4.3 L of serosanguineous fluid was removed.
Samples were sent to the laboratory as requested by the clinical
team.
IMPRESSION: Status post ultrasound-guided paracentesis. 4.3 L of serosanguineous
fluid removed.

## 2017-12-23 MED ORDER — HYDROCODONE-ACETAMINOPHEN 5-325 MG PO TABS
1.0000 | ORAL_TABLET | ORAL | Status: DC | PRN
  Administered 2017-12-24 – 2017-12-26 (×14): 2 via ORAL
  Filled 2017-12-23 (×14): qty 2

## 2017-12-23 MED ORDER — FOLIC ACID 1 MG PO TABS
1.0000 mg | ORAL_TABLET | Freq: Every day | ORAL | Status: DC
  Administered 2017-12-24 – 2017-12-26 (×3): 1 mg via ORAL
  Filled 2017-12-23 (×3): qty 1

## 2017-12-23 MED ORDER — BISACODYL 5 MG PO TBEC: 5.0000 mg | DELAYED_RELEASE_TABLET | Freq: Every day | ORAL | Status: DC | PRN

## 2017-12-23 MED ORDER — RENA-VITE PO TABS
1.0000 | ORAL_TABLET | Freq: Every day | ORAL | Status: DC
  Administered 2017-12-23 – 2017-12-25 (×3): 1 via ORAL
  Filled 2017-12-23 (×3): qty 1

## 2017-12-23 MED ORDER — ACETAMINOPHEN 325 MG PO TABS: 650.0000 mg | ORAL_TABLET | Freq: Four times a day (QID) | ORAL | Status: DC | PRN

## 2017-12-23 MED ORDER — FLUTICASONE FUROATE-VILANTEROL 200-25 MCG/INH IN AEPB
1.0000 | INHALATION_SPRAY | Freq: Every day | RESPIRATORY_TRACT | Status: DC
  Administered 2017-12-24 – 2017-12-25 (×2): 1 via RESPIRATORY_TRACT
  Filled 2017-12-23: qty 28

## 2017-12-23 MED ORDER — ONDANSETRON HCL 4 MG/2ML IJ SOLN: 4.0000 mg | Freq: Four times a day (QID) | INTRAMUSCULAR | Status: DC | PRN

## 2017-12-23 MED ORDER — CALCIUM ACETATE (PHOS BINDER) 667 MG PO CAPS
2001.0000 mg | ORAL_CAPSULE | Freq: Three times a day (TID) | ORAL | Status: DC
  Administered 2017-12-24 – 2017-12-26 (×4): 2001 mg via ORAL
  Filled 2017-12-23 (×4): qty 3

## 2017-12-23 MED ORDER — HEPARIN SODIUM (PORCINE) 5000 UNIT/ML IJ SOLN
5000.0000 [IU] | Freq: Three times a day (TID) | INTRAMUSCULAR | Status: DC
  Administered 2017-12-23 – 2017-12-26 (×7): 5000 [IU] via SUBCUTANEOUS
  Filled 2017-12-23 (×7): qty 1

## 2017-12-23 MED ORDER — ACETAMINOPHEN 650 MG RE SUPP: 650.0000 mg | Freq: Four times a day (QID) | RECTAL | Status: DC | PRN

## 2017-12-23 MED ORDER — PANTOPRAZOLE SODIUM 40 MG PO TBEC
40.0000 mg | DELAYED_RELEASE_TABLET | Freq: Every day | ORAL | Status: DC
  Administered 2017-12-24 – 2017-12-26 (×3): 40 mg via ORAL
  Filled 2017-12-23 (×3): qty 1

## 2017-12-23 MED ORDER — IPRATROPIUM-ALBUTEROL 0.5-2.5 (3) MG/3ML IN SOLN
3.0000 mL | Freq: Four times a day (QID) | RESPIRATORY_TRACT | Status: DC | PRN
  Administered 2017-12-24 – 2017-12-25 (×2): 3 mL via RESPIRATORY_TRACT
  Filled 2017-12-23 (×2): qty 3

## 2017-12-23 MED ORDER — TIOTROPIUM BROMIDE MONOHYDRATE 18 MCG IN CAPS
18.0000 ug | ORAL_CAPSULE | Freq: Every day | RESPIRATORY_TRACT | Status: DC
  Administered 2017-12-24: 18 ug via RESPIRATORY_TRACT
  Filled 2017-12-23: qty 5

## 2017-12-23 MED ORDER — DOCUSATE SODIUM 100 MG PO CAPS
100.0000 mg | ORAL_CAPSULE | Freq: Two times a day (BID) | ORAL | Status: DC
  Administered 2017-12-23 – 2017-12-26 (×5): 100 mg via ORAL
  Filled 2017-12-23 (×5): qty 1

## 2017-12-23 MED ORDER — ONDANSETRON HCL 4 MG PO TABS: 4.0000 mg | ORAL_TABLET | Freq: Four times a day (QID) | ORAL | Status: DC | PRN

## 2017-12-23 MED ORDER — LABETALOL HCL 100 MG PO TABS
100.0000 mg | ORAL_TABLET | Freq: Every day | ORAL | Status: DC
  Administered 2017-12-24: 100 mg via ORAL
  Filled 2017-12-23 (×3): qty 1

## 2017-12-23 MED ORDER — FERROUS SULFATE 325 (65 FE) MG PO TABS
325.0000 mg | ORAL_TABLET | Freq: Three times a day (TID) | ORAL | Status: DC
  Administered 2017-12-23 – 2017-12-26 (×6): 325 mg via ORAL
  Filled 2017-12-23 (×8): qty 1

## 2017-12-23 NOTE — H&P (Signed)
[facility] [place] NAME: Stanley [name]    MR#:  [id number]  DATE OF BIRTH:  12-24-1960  DATE OF ADMISSION:  12/23/2017  PRIMARY CARE PHYSICIAN: Alene Mires Elyse Jarvis, MD   REQUESTING/REFERRING PHYSICIAN:   CHIEF COMPLAINT:   Chief Complaint  Patient presents with  . Shortness of Breath    HISTORY OF PRESENT ILLNESS: Stanley [name]  is a 57 y.o. male with a known history of CHF, end-stage renal disease, CAD and prior history of drug abuse. Patient presented to emergency room for acute onset of shortness of breath going on for the past 24 hours.  Patient admits that he was unable to attend the past 2 dialysis sessions. Blood test done emergency room, including CBC and BMP grossly at baseline, although unable to see potassium level hemolyzed sample.  Repeat sample is pending results. EKG shows normal sinus rhythm with heart rate at 74, no acute ischemic changes. Chest x-ray shows fluid overload. Patient is admitted for further evaluation and treatment.   PAST MEDICAL HISTORY:   Past Medical History:  Diagnosis Date  . Alcohol abuse   . CHF (congestive heart failure) ([place])   . Cirrhosis ([place])   . Coronary artery disease 2009  . Drug abuse ([place])   . End stage renal disease on dialysis [facility]) NEPHROLOGIST-   DR [facility]  IN [place]   HEMODIALYSIS --   TUES/  THURS/  SAT  . Gastrointestinal bleed 06/13/2017   From chart...hx of multiple GI bleeds  . GERD (gastroesophageal reflux disease)   . Hyperlipidemia   . Hypertension   . PAD (peripheral artery disease) ([place])   . Renal insufficiency    Per pt, 32 oz fluid restriction per day  . S/P triple vessel bypass 06/09/2016   2009ish  . Suicidal ideation    & HOMICIDAL IDEATION --  06-16-2013   ADMITTED TO BEHAVIOR HEALTH    PAST SURGICAL HISTORY:  Past Surgical History:  Procedure Laterality Date  . A/V FISTULAGRAM Right 06/06/2017   Procedure: A/V FISTULAGRAM;  Surgeon:  Katha Cabal, MD;  Location: [place] CV LAB;  Service: Cardiovascular;  Laterality: Right;  . A/V SHUNT INTERVENTION N/A 06/06/2017   Procedure: A/V SHUNT INTERVENTION;  Surgeon: Katha Cabal, MD;  Location: [place] CV LAB;  Service: Cardiovascular;  Laterality: N/A;  . AGILE CAPSULE N/A 06/19/2016   Procedure: AGILE CAPSULE;  Surgeon: [name], MD;  Location: [facility] ENDOSCOPY;  Service: Endoscopy;  Laterality: N/A;  . COLONOSCOPY WITH PROPOFOL N/A 06/18/2016   Procedure: COLONOSCOPY WITH PROPOFOL;  Surgeon: [name], MD;  Location: [facility] ENDOSCOPY;  Service: Endoscopy;  Laterality: N/A;  . COLONOSCOPY WITH PROPOFOL N/A 08/12/2016   Procedure: COLONOSCOPY WITH PROPOFOL;  Surgeon: [name], MD;  Location: [facility] ENDOSCOPY;  Service: Endoscopy;  Laterality: N/A;  . COLONOSCOPY WITH PROPOFOL N/A 05/05/2017   Procedure: COLONOSCOPY WITH PROPOFOL;  Surgeon: Manya Silvas, MD;  Location: [facility] ENDOSCOPY;  Service: Endoscopy;  Laterality: N/A;  . CORONARY ANGIOPLASTY  ?   PT UNABLE TO TELL IF  BEFORE OR AFTER  CABG  . CORONARY ARTERY BYPASS GRAFT  2008  ([place] , [place])   3 VESSEL  . DIALYSIS FISTULA CREATION  LAST SURGERY  APPOX  2008  . ENTEROSCOPY N/A 05/10/2016   Procedure: ENTEROSCOPY;  Surgeon: Jerene Bears, MD;  Location: [place];  Service: Gastroenterology;  Laterality: N/A;  . ENTEROSCOPY N/A 08/12/2016   Procedure: ENTEROSCOPY;  Surgeon: Allen Norris,  Darren, MD;  Location: [place] ENDOSCOPY;  Service: Endoscopy;  Laterality: N/A;  . ENTEROSCOPY Left 06/03/2017   Procedure: ENTEROSCOPY;  Surgeon: Virgel Manifold, MD;  Location: [facility] ENDOSCOPY;  Service: Endoscopy;  Laterality: Left;  Procedure date will ultimately depend on when patient is medically optimized before the procedure, pending hemodialysis and blood transfusions etc. Will place on schedule and change depending on clinical status.   . ENTEROSCOPY N/A 06/05/2017   Procedure: ENTEROSCOPY;  Surgeon: Virgel Manifold, MD;  Location: [facility] ENDOSCOPY;  Service: Endoscopy;  Laterality: N/A;  . ENTEROSCOPY N/A 06/15/2017   Procedure: Push ENTEROSCOPY;  Surgeon: [name], MD;  Location: [facility] ENDOSCOPY;  Service: Endoscopy;  Laterality: N/A;  . ESOPHAGOGASTRODUODENOSCOPY N/A 05/07/2015   Procedure: ESOPHAGOGASTRODUODENOSCOPY (EGD);  Surgeon: Hulen Luster, MD;  Location: [facility] ENDOSCOPY;  Service: Endoscopy;  Laterality: N/A;  . ESOPHAGOGASTRODUODENOSCOPY (EGD) WITH PROPOFOL N/A 05/17/2015   Procedure: ESOPHAGOGASTRODUODENOSCOPY (EGD) WITH PROPOFOL;  Surgeon: [name], MD;  Location: [facility] ENDOSCOPY;  Service: Endoscopy;  Laterality: N/A;  . ESOPHAGOGASTRODUODENOSCOPY (EGD) WITH PROPOFOL N/A 01/20/2016   Procedure: ESOPHAGOGASTRODUODENOSCOPY (EGD) WITH PROPOFOL;  Surgeon: [name], MD;  Location: [facility] ENDOSCOPY;  Service: Endoscopy;  Laterality: N/A;  . ESOPHAGOGASTRODUODENOSCOPY (EGD) WITH PROPOFOL N/A 04/17/2016   Procedure: ESOPHAGOGASTRODUODENOSCOPY (EGD) WITH PROPOFOL;  Surgeon: Lin Landsman, MD;  Location: [facility] ENDOSCOPY;  Service: Gastroenterology;  Laterality: N/A;  . ESOPHAGOGASTRODUODENOSCOPY (EGD) WITH PROPOFOL  05/09/2016   Procedure: ESOPHAGOGASTRODUODENOSCOPY (EGD) WITH PROPOFOL;  Surgeon: Jerene Bears, MD;  Location: [place];  Service: Endoscopy;;  . ESOPHAGOGASTRODUODENOSCOPY (EGD) WITH PROPOFOL N/A 06/16/2016   Procedure: ESOPHAGOGASTRODUODENOSCOPY (EGD) WITH PROPOFOL;  Surgeon: [name], MD;  Location: [facility] ENDOSCOPY;  Service: Endoscopy;  Laterality: N/A;  . ESOPHAGOGASTRODUODENOSCOPY (EGD) WITH PROPOFOL N/A 05/05/2017   Procedure: ESOPHAGOGASTRODUODENOSCOPY (EGD) WITH PROPOFOL;  Surgeon: Manya Silvas, MD;  Location: [facility] ENDOSCOPY;  Service: Endoscopy;  Laterality: N/A;  . ESOPHAGOGASTRODUODENOSCOPY (EGD) WITH PROPOFOL N/A 06/15/2017   Procedure: ESOPHAGOGASTRODUODENOSCOPY (EGD) WITH PROPOFOL;  Surgeon: [name], MD;  Location: [facility] ENDOSCOPY;  Service: Endoscopy;  Laterality: N/A;   . GIVENS CAPSULE STUDY N/A 05/07/2016   Procedure: GIVENS CAPSULE STUDY;  Surgeon: Doran Stabler, MD;  Location: [place];  Service: Endoscopy;  Laterality: N/A;  . MANDIBULAR HARDWARE REMOVAL N/A 07/29/2013   Procedure: REMOVAL OF ARCH BARS;  Surgeon: Theodoro Kos, DO;  Location: [place];  Service: Plastics;  Laterality: N/A;  . ORIF MANDIBULAR FRACTURE N/A 06/05/2013   Procedure: REPAIR OF MANDIBULAR FRACTURE x 2 with maxillo-mandibular fixation ;  Surgeon: Theodoro Kos, DO;  Location: [place];  Service: Plastics;  Laterality: N/A;  . PARACENTESIS    . PERIPHERAL ARTERIAL STENT GRAFT Left     SOCIAL HISTORY:  Social History   Tobacco Use  . Smoking status: Current Every Day Smoker    Packs/day: 0.15    Years: 40.00    Pack years: 6.00    Types: Cigarettes  . Smokeless tobacco: Never Used  Substance Use Topics  . Alcohol use: No    Comment: pt reports quitting after learning about cirrhosis    FAMILY HISTORY:  Family History  Problem Relation Age of Onset  . Colon cancer Mother   . Cancer Father   . Cancer Sister   . Kidney disease Brother     DRUG ALLERGIES: No Known Allergies  REVIEW OF SYSTEMS:   CONSTITUTIONAL: No fever, fatigue or weakness.  EYES: No changes in vision.  EARS, NOSE, AND THROAT:  No tinnitus or ear pain.  RESPIRATORY: Positive for shortness of breath, wheezing, but no cough or hemoptysis.  CARDIOVASCULAR: No chest pain, orthopnea, edema.  GASTROINTESTINAL: No nausea, vomiting, diarrhea or abdominal pain.  GENITOURINARY: No dysuria, hematuria.  HEMATOLOGY: No bleeding. SKIN: No rash or lesion. MUSCULOSKELETAL: No joint pain at this time.   NEUROLOGIC: No focal weakness.  PSYCHIATRY: No anxiety or depression.   MEDICATIONS AT HOME:  Prior to Admission medications   Medication Sig Start Date End Date Taking? Authorizing Provider  budesonide-formoterol (SYMBICORT) 160-4.5 MCG/ACT inhaler Inhale 2 puffs into the lungs  daily. 11/16/16   Vaughan Basta, MD  calcium acetate (PHOSLO) 667 MG capsule Take 2,001 mg by mouth 3 (three) times daily.    [provider]  ferrous sulfate 325 (65 FE) MG tablet Take 1 tablet by mouth 3 (three) times daily. 01/21/17   [provider]  folic acid (FOLVITE) 1 MG tablet Take 1 tablet (1 mg total) by mouth daily. 10/31/17   Fritzi Mandes, MD  gabapentin (NEURONTIN) 300 MG capsule Take 300 mg by mouth 2 (two) times daily.  08/16/16   [provider]  ipratropium-albuterol (DUONEB) 0.5-2.5 (3) MG/3ML SOLN Take 3 mLs by nebulization every 6 (six) hours as needed. 11/28/16   Loletha Grayer, MD  labetalol (NORMODYNE) 100 MG tablet Take 100 mg by mouth daily. 03/24/17   [provider]  multivitamin (RENA-VIT) TABS tablet Take 1 tablet by mouth at bedtime. 10/31/17   Fritzi Mandes, MD  nitroGLYCERIN (NITROSTAT) 0.4 MG SL tablet Place 1 tablet (0.4 mg total) under the tongue every 5 (five) minutes as needed. 04/12/16   Wende Bushy, MD  omeprazole (PRILOSEC) 40 MG capsule Take 40 mg by mouth daily. 09/08/17   [provider]  Spacer/Aero Chamber Mouthpiece MISC 1 Units by Does not apply route every 4 (four) hours as needed (wheezing). 02/19/17   Darel Hong, MD  tiotropium (SPIRIVA HANDIHALER) 18 MCG inhalation capsule Place 1 capsule (18 mcg total) into inhaler and inhale daily. Patient not taking: Reported on 12/09/2017 11/16/16 12/09/17  Vaughan Basta, MD      PHYSICAL EXAMINATION:   VITAL SIGNS: Blood pressure 119/79, pulse 83, temperature 98.4 F (36.9 C), temperature source Oral, resp. rate 18, height 6' (1.829 m), weight 81.6 kg, SpO2 98 %.  GENERAL:  57 y.o.-year-old patient lying in the bed with no acute distress, at rest.  EYES: Pupils equal, round, reactive to light and accommodation. No scleral icterus. Extraocular muscles intact.  HEENT: Head atraumatic, normocephalic. Oropharynx and nasopharynx clear.  NECK:  Supple, no  jugular venous distention. No thyroid enlargement, no tenderness.  LUNGS: Reduced breath sounds and scattered wheezing noted bilaterally. No use of accessory muscles of respiration.  CARDIOVASCULAR: S1, S2 normal. No S3/S4.  ABDOMEN: Soft, nontender, nondistended. Bowel sounds present. No organomegaly or mass.  EXTREMITIES: No pedal edema, cyanosis, or clubbing.  NEUROLOGIC: No focal weakness. PSYCHIATRIC: The patient is alert and oriented x 3.  SKIN: No obvious rash, lesion, or ulcer.   LABORATORY PANEL:   CBC Recent Labs  Lab 12/20/17 1423 12/23/17 1736  WBC 7.4 6.8  HGB 9.7* 11.3*  HCT 35.2* 40.9  PLT 285 294  MCV 89.1 88.7  MCH 24.6* 24.5*  MCHC 27.6* 27.6*  RDW 23.1* 22.8*  LYMPHSABS 0.8  --   MONOABS 0.9  --   EOSABS 0.2  --   BASOSABS 0.0  --    ------------------------------------------------------------------------------------------------------------------  Chemistries  Recent Labs  Lab 12/20/17 1423  12/23/17 2011  NA 141 135  K 5.9* UNABLE TO RESULT DUE TO HEMOLYSIS  CL 104 102  CO2 26 22  GLUCOSE 124* 125*  BUN 37* 66*  CREATININE 8.60* 12.13*  CALCIUM 8.7* 8.5*  AST 21 UNABLE TO RESULT DUE TO HEMOLYSIS  ALT 19 UNABLE TO RESULT DUE TO HEMOLYSIS  ALKPHOS 113 153*  BILITOT 0.4 UNABLE TO RESULT DUE TO HEMOLYSIS   ------------------------------------------------------------------------------------------------------------------ estimated creatinine clearance is 7.3 mL/min (A) (by C-G formula based on SCr of 12.13 mg/dL (H)). ------------------------------------------------------------------------------------------------------------------ No results for input(s): TSH, T4TOTAL, T3FREE, THYROIDAB in the last 72 hours.  Invalid input(s): FREET3   Coagulation profile No results for input(s): INR, PROTIME in the last 168 hours. ------------------------------------------------------------------------------------------------------------------- No results for  input(s): DDIMER in the last 72 hours. -------------------------------------------------------------------------------------------------------------------  Cardiac Enzymes No results for input(s): CKMB, TROPONINI, MYOGLOBIN in the last 168 hours.  Invalid input(s): CK ------------------------------------------------------------------------------------------------------------------ Invalid input(s): POCBNP  ---------------------------------------------------------------------------------------------------------------  Urinalysis    Component Value Date/Time   COLORURINE YELLOW (A) 05/03/2015 0815   APPEARANCEUR CLEAR (A) 05/03/2015 0815   APPEARANCEUR Clear 11/13/2013 1314   LABSPEC 1.008 05/03/2015 0815   LABSPEC 1.014 11/13/2013 1314   PHURINE 9.0 (H) 05/03/2015 0815   GLUCOSEU NEGATIVE 05/03/2015 0815   GLUCOSEU 50 mg/dL 11/13/2013 1314   HGBUR NEGATIVE 05/03/2015 0815   BILIRUBINUR NEGATIVE 05/03/2015 0815   BILIRUBINUR Negative 11/13/2013 1314   KETONESUR NEGATIVE 05/03/2015 0815   PROTEINUR 100 (A) 05/03/2015 0815   UROBILINOGEN 0.2 06/22/2014 1230   NITRITE NEGATIVE 05/03/2015 0815   LEUKOCYTESUR NEGATIVE 05/03/2015 0815   LEUKOCYTESUR Negative 11/13/2013 1314     RADIOLOGY: Dg Chest 2 View  Result Date: 12/23/2017 CLINICAL DATA:  Shortness of breath. EXAM: CHEST - 2 VIEW COMPARISON:  Chest x-ray dated 12/09/2017. FINDINGS: Stable cardiomegaly. Dialysis catheter is stable in position with tip at the level of the mid SVC. Mildly prominent interstitial markings noted bilaterally, slightly more prominent than on previous studies suggesting mild interstitial edema related to CHF. No confluent opacity to suggest pneumonia or alveolar pulmonary edema. No pleural effusion or pneumothorax seen. IMPRESSION: 1. Mild interstitial edema indicating mild CHF/volume overload. 2. Stable cardiomegaly. Electronically Signed   By: Franki Cabot M.D.   On: 12/23/2017 18:59    EKG: Orders  placed or performed during the hospital encounter of 12/23/17  . ED EKG  . ED EKG  . EKG 12-Lead  . EKG 12-Lead    IMPRESSION AND PLAN:  1.  Acute pulmonary edema, will arrange for hemodialysis. 2.  ESRD, continue dialysis per nephrology. 3.  HTN, restart home medications. 4.  COPD, without acute exacerbation, continue maintenance therapy. 5.  Tobacco abuse, smoking cessation discussed with patient in detail.  All the records are reviewed and case discussed with ED provider. Management plans discussed with the patient, family and they are in agreement.  CODE STATUS: Full Code Status History    Date Active Date Inactive Code Status Order ID Comments User Context   12/09/2017 1043 12/11/2017 1617 Full Code [id number]  Epifanio Lesches, MD ED   11/27/2017 1122 11/28/2017 1827 Full Code [id number]  Demetrios Loll, MD Inpatient   11/18/2017 2008 11/20/2017 1853 Full Code [id number]  Saundra Shelling, MD Inpatient   10/30/2017 0007 10/31/2017 1724 Full Code [id number]  Amelia Jo, MD Inpatient   06/12/2017 1707 06/16/2017 1704 Full Code [id number]  Nicholes Mango, MD Inpatient   06/02/2017 1603 06/06/2017 2222 Full Code [id number]  Demetrios Loll, MD Inpatient   05/24/2017 1831 05/25/2017  1921 Full Code [id number]  Saundra Shelling, MD ED   05/03/2017 1331 05/05/2017 1806 Full Code [id number]  Bettey Costa, MD Inpatient   04/01/2017 0947 04/03/2017 1156 Full Code [id number]  Loletha Grayer, MD ED   03/09/2017 0010 03/09/2017 1928 Full Code [id number]  Lance Coon, MD Inpatient   03/03/2017 2027 03/04/2017 1920 Full Code [id number]  Demetrios Loll, MD Inpatient   02/28/2017 2254 03/02/2017 1519 Full Code [id number]  Lance Coon, MD Inpatient   02/22/2017 1721 02/24/2017 1359 Full Code [id number]  Dustin Flock, MD Inpatient   01/22/2017 2337 01/25/2017 1522 Full Code [id number]  Gorden Harms, MD Inpatient   01/07/2017 1445 01/13/2017 1812 Full Code [id number]  Idelle Crouch, MD Inpatient   12/22/2016 1431 12/24/2016  1804 Full Code [id number]  Hillary Bow, MD ED   12/20/2016 2020 12/21/2016 1853 Full Code [id number]  Demetrios Loll, MD Inpatient   11/26/2016 1030 11/28/2016 1522 Full Code [id number]  Henreitta Leber, MD Inpatient   11/15/2016 1930 11/16/2016 1822 Full Code [id number]  Fritzi Mandes, MD Inpatient   10/29/2016 1453 10/30/2016 1506 Full Code [id number]  Loletha Grayer, MD ED   09/15/2016 1757 09/16/2016 1817 Full Code [id number]  Dustin Flock, MD ED   08/16/2016 0945 08/18/2016 1816 Full Code [id number]  Hillary Bow, MD ED   08/09/2016 1659 08/12/2016 2034 Full Code [id number]  Fritzi Mandes, MD Inpatient   07/03/2016 2154 07/05/2016 2122 Full Code [id number]  Demetrios Loll, MD Inpatient   06/24/2016 0423 06/26/2016 1546 Full Code [id number]  Saundra Shelling, MD Inpatient   06/14/2016 1453 06/19/2016 2104 Full Code [id number]  Hillary Bow, MD ED   05/31/2016 0642 06/01/2016 2130 Full Code [id number]  Saundra Shelling, MD Inpatient   05/05/2016 2309 05/10/2016 2157 Full Code [id number]  Vianne Bulls, MD Inpatient   04/16/2016 0356 04/17/2016 1827 Full Code [id number]  Saundra Shelling, MD Inpatient   01/19/2016 1513 01/22/2016 1414 Full Code [id number]  Loletha Grayer, MD ED   09/24/2015 1033 09/25/2015 1331 Full Code [id number]  Loletha Grayer, MD ED   05/16/2015 1417 05/17/2015 2057 Full Code [id number]  Idelle Crouch, MD Inpatient   04/30/2015 2008 05/08/2015 1940 Full Code [id number]  Vaughan Basta, MD Inpatient   04/06/2015 0507 04/07/2015 1736 Full Code [id number]  Lance Coon, MD Inpatient   03/23/2015 1107 03/27/2015 1246 Full Code [id number]  Bettey Costa, MD Inpatient   06/19/2013 1132 06/21/2013 1749 Full Code [id number]  Cristal Ford, DO Inpatient   06/16/2013 1726 06/19/2013 1132 Full Code [id number]  Margarita Mail, PA-C ED   06/05/2013 1533 06/06/2013 1856 Full Code [id number]  Theodoro Kos, DO Inpatient   06/02/2013 0901 06/05/2013 1533 Full Code [id number]  Velvet Bathe, MD Inpatient   06/02/2013 0840 06/02/2013 0901 Full Code  [id number]  [place] Bathe, MD Inpatient       TOTAL TIME TAKING CARE OF THIS PATIENT: 50 minutes.    Amelia Jo M.D on 12/23/2017 at 9:27 PM  Between 7am to 6pm - Pager - [phone]  After 6pm go to [email or web address] - password [email or web address] [facility]  [phone]  CC: Primary care physician; Alene Mires, Elyse Jarvis, MD

## 2017-12-23 NOTE — ED Provider Notes (Signed)
[facility] Emergency Department Provider Note  ___________________________________________   First MD Initiated Contact with Patient 12/23/17 1727     (approximate)  I have reviewed the triage vital signs and the nursing notes.   HISTORY  Chief Complaint Shortness of Breath   HPI Stanley [name] is a 57 y.o. male with a history of alcohol abuse, CHF as well as end-stage renal disease on dialysis was presented to emergency department today with shortness of breath. He says that he was unable to have dialysis this past Thursday as well as this past Saturday because he had to pick up his daughter from [facility]. He is not claiming any pain at this time. Denies any fever   Past Medical History:  Diagnosis Date  . Alcohol abuse   . CHF (congestive heart failure) ([place])   . Cirrhosis ([place])   . Coronary artery disease 2009  . Drug abuse ([place])   . End stage renal disease on dialysis [facility]) NEPHROLOGIST-   DR [facility]  IN [place]   HEMODIALYSIS --   TUES/  THURS/  SAT  . Gastrointestinal bleed 06/13/2017   From chart...hx of multiple GI bleeds  . GERD (gastroesophageal reflux disease)   . Hyperlipidemia   . Hypertension   . PAD (peripheral artery disease) ([place])   . Renal insufficiency    Per pt, 32 oz fluid restriction per day  . S/P triple vessel bypass 06/09/2016   2009ish  . Suicidal ideation    & HOMICIDAL IDEATION --  06-16-2013   ADMITTED TO BEHAVIOR HEALTH    Patient Active Problem List   Diagnosis Date Noted  . Acute respiratory failure ([place]) 10/29/2017  . ESRD (end stage renal disease) on dialysis ([place]) 07/28/2017  . Protein-calorie malnutrition, severe 06/14/2017  . Encounter for dialysis [facility])   . Palliative care by specialist   . Goals of care, counseling/discussion   . Malnutrition of moderate degree 06/05/2017  . Secondary esophageal varices without bleeding ([place])   . Stomach irritation   . Idiopathic esophageal varices  without bleeding ([place])   . Alcoholic hepatitis with ascites 05/24/2017  . ESRD (end stage renal disease) ([place]) 04/28/2017  . Uremia 03/08/2017  . ESRD on hemodialysis ([place]) 03/03/2017  . Weakness 02/28/2017  . Hypocalcemia 02/22/2017  . Shortness of breath 11/26/2016  . COPD (chronic obstructive pulmonary disease) ([place]) 10/30/2016  . COPD exacerbation ([place]) 10/29/2016  . Anemia   . Heme positive stool   . Ulceration of intestine   . Benign neoplasm of transverse colon   . Acute gastrointestinal hemorrhage   . Esophageal candidiasis ([place])   . Angiodysplasia of intestinal tract   . Acute respiratory failure with hypoxia ([place]) 07/03/2016  . GI bleeding 06/24/2016  . Rectal bleeding 06/14/2016  . Anemia of chronic disease 06/01/2016  . MRSA carrier 06/01/2016  . Chronic renal failure 05/23/2016  . Ischemic heart disease 05/23/2016  . Angiodysplasia of small intestine   . Melena   . Small bowel bleed not requiring more than 4 units of blood in 24 hours, ICU, or surgery   . Anemia due to chronic blood loss   . Abdominal pain 05/05/2016  . Acute posthemorrhagic anemia 04/17/2016  . Gastrointestinal bleed 04/17/2016  . History of esophagogastroduodenoscopy (EGD) 04/17/2016  . Elevated troponin 04/17/2016  . Alcohol abuse 04/17/2016  . Upper GI bleed 01/19/2016  . Blood in stool   . Angiodysplasia of stomach and duodenum with hemorrhage   . Gastritis   .  Reflux esophagitis   . GI bleed 05/16/2015  . Acute GI bleeding   . Symptomatic anemia 04/30/2015  . HTN (hypertension) 04/06/2015  . GERD (gastroesophageal reflux disease) 04/06/2015  . HLD (hyperlipidemia) 04/06/2015  . Dyspnea 04/06/2015  . Cirrhosis of liver with ascites ([place]) 04/06/2015  . Ascites 04/06/2015  . GIB (gastrointestinal bleeding) 03/23/2015  . Homicidal ideation 06/19/2013  . Suicidal intent 06/19/2013  . Homicidal ideations 06/19/2013  . Hyperkalemia 06/16/2013  . Mandible fracture ([place]) 06/05/2013  .  Fracture, mandible ([place]) 06/02/2013  . Coronary atherosclerosis of native coronary artery 06/02/2013  . ESRD on dialysis ([place]) 06/02/2013  . Mandible open fracture ([place]) 06/02/2013    Past Surgical History:  Procedure Laterality Date  . A/V FISTULAGRAM Right 06/06/2017   Procedure: A/V FISTULAGRAM;  Surgeon: Katha Cabal, MD;  Location: [place] CV LAB;  Service: Cardiovascular;  Laterality: Right;  . A/V SHUNT INTERVENTION N/A 06/06/2017   Procedure: A/V SHUNT INTERVENTION;  Surgeon: Katha Cabal, MD;  Location: [place] [facility];  Service: Cardiovascular;  Laterality: N/A;  . AGILE CAPSULE N/A 06/19/2016   Procedure: AGILE CAPSULE;  Surgeon: [name], MD;  Location: [facility] ENDOSCOPY;  Service: Endoscopy;  Laterality: N/A;  . COLONOSCOPY WITH PROPOFOL N/A 06/18/2016   Procedure: COLONOSCOPY WITH PROPOFOL;  Surgeon: [name], MD;  Location: [facility] ENDOSCOPY;  Service: Endoscopy;  Laterality: N/A;  . COLONOSCOPY WITH PROPOFOL N/A 08/12/2016   Procedure: COLONOSCOPY WITH PROPOFOL;  Surgeon: [name], MD;  Location: [facility] ENDOSCOPY;  Service: Endoscopy;  Laterality: N/A;  . COLONOSCOPY WITH PROPOFOL N/A 05/05/2017   Procedure: COLONOSCOPY WITH PROPOFOL;  Surgeon: Manya Silvas, MD;  Location: [facility] ENDOSCOPY;  Service: Endoscopy;  Laterality: N/A;  . CORONARY ANGIOPLASTY  ?   PT UNABLE TO TELL IF  BEFORE OR AFTER  CABG  . CORONARY ARTERY BYPASS GRAFT  2008  ([place] , [place])   3 VESSEL  . DIALYSIS FISTULA CREATION  LAST SURGERY  APPOX  2008  . ENTEROSCOPY N/A 05/10/2016   Procedure: ENTEROSCOPY;  Surgeon: Jerene Bears, MD;  Location: [place];  Service: Gastroenterology;  Laterality: N/A;  . ENTEROSCOPY N/A 08/12/2016   Procedure: ENTEROSCOPY;  Surgeon: [name], MD;  Location: [facility] ENDOSCOPY;  Service: Endoscopy;  Laterality: N/A;  . ENTEROSCOPY Left 06/03/2017   Procedure: ENTEROSCOPY;  Surgeon: Virgel Manifold, MD;  Location: [facility] ENDOSCOPY;  Service: Endoscopy;   Laterality: Left;  Procedure date will ultimately depend on when patient is medically optimized before the procedure, pending hemodialysis and blood transfusions etc. Will place on schedule and change depending on clinical status.   . ENTEROSCOPY N/A 06/05/2017   Procedure: ENTEROSCOPY;  Surgeon: Virgel Manifold, MD;  Location: [facility] ENDOSCOPY;  Service: Endoscopy;  Laterality: N/A;  . ENTEROSCOPY N/A 06/15/2017   Procedure: Push ENTEROSCOPY;  Surgeon: [name], MD;  Location: [facility] ENDOSCOPY;  Service: Endoscopy;  Laterality: N/A;  . ESOPHAGOGASTRODUODENOSCOPY N/A 05/07/2015   Procedure: ESOPHAGOGASTRODUODENOSCOPY (EGD);  Surgeon: Hulen Luster, MD;  Location: [facility] ENDOSCOPY;  Service: Endoscopy;  Laterality: N/A;  . ESOPHAGOGASTRODUODENOSCOPY (EGD) WITH PROPOFOL N/A 05/17/2015   Procedure: ESOPHAGOGASTRODUODENOSCOPY (EGD) WITH PROPOFOL;  Surgeon: [name], MD;  Location: [facility] ENDOSCOPY;  Service: Endoscopy;  Laterality: N/A;  . ESOPHAGOGASTRODUODENOSCOPY (EGD) WITH PROPOFOL N/A 01/20/2016   Procedure: ESOPHAGOGASTRODUODENOSCOPY (EGD) WITH PROPOFOL;  Surgeon: [name], MD;  Location: [facility] ENDOSCOPY;  Service: Endoscopy;  Laterality: N/A;  . ESOPHAGOGASTRODUODENOSCOPY (EGD) WITH PROPOFOL N/A 04/17/2016   Procedure: ESOPHAGOGASTRODUODENOSCOPY (EGD) WITH PROPOFOL;  Surgeon: Lin Landsman, MD;  Location: [facility] ENDOSCOPY;  Service: Gastroenterology;  Laterality: N/A;  . ESOPHAGOGASTRODUODENOSCOPY (EGD) WITH PROPOFOL  05/09/2016   Procedure: ESOPHAGOGASTRODUODENOSCOPY (EGD) WITH PROPOFOL;  Surgeon: Jerene Bears, MD;  Location: [place];  Service: Endoscopy;;  . ESOPHAGOGASTRODUODENOSCOPY (EGD) WITH PROPOFOL N/A 06/16/2016   Procedure: ESOPHAGOGASTRODUODENOSCOPY (EGD) WITH PROPOFOL;  Surgeon: [name], MD;  Location: [facility] ENDOSCOPY;  Service: Endoscopy;  Laterality: N/A;  . ESOPHAGOGASTRODUODENOSCOPY (EGD) WITH PROPOFOL N/A 05/05/2017   Procedure: ESOPHAGOGASTRODUODENOSCOPY (EGD) WITH PROPOFOL;   Surgeon: Manya Silvas, MD;  Location: [facility] ENDOSCOPY;  Service: Endoscopy;  Laterality: N/A;  . ESOPHAGOGASTRODUODENOSCOPY (EGD) WITH PROPOFOL N/A 06/15/2017   Procedure: ESOPHAGOGASTRODUODENOSCOPY (EGD) WITH PROPOFOL;  Surgeon: [name], MD;  Location: [facility] ENDOSCOPY;  Service: Endoscopy;  Laterality: N/A;  . GIVENS CAPSULE STUDY N/A 05/07/2016   Procedure: GIVENS CAPSULE STUDY;  Surgeon: Doran Stabler, MD;  Location: [place];  Service: Endoscopy;  Laterality: N/A;  . MANDIBULAR HARDWARE REMOVAL N/A 07/29/2013   Procedure: REMOVAL OF ARCH BARS;  Surgeon: Theodoro Kos, DO;  Location: [place];  Service: Plastics;  Laterality: N/A;  . ORIF MANDIBULAR FRACTURE N/A 06/05/2013   Procedure: REPAIR OF MANDIBULAR FRACTURE x 2 with maxillo-mandibular fixation ;  Surgeon: Theodoro Kos, DO;  Location: [place];  Service: Plastics;  Laterality: N/A;  . PARACENTESIS    . PERIPHERAL ARTERIAL STENT GRAFT Left     Prior to Admission medications   Medication Sig Start Date End Date Taking? Authorizing Provider  budesonide-formoterol (SYMBICORT) 160-4.5 MCG/ACT inhaler Inhale 2 puffs into the lungs daily. 11/16/16   Vaughan Basta, MD  calcium acetate (PHOSLO) 667 MG capsule Take 2,001 mg by mouth 3 (three) times daily.    [provider]  ferrous sulfate 325 (65 FE) MG tablet Take 1 tablet by mouth 3 (three) times daily. 01/21/17   [provider]  folic acid (FOLVITE) 1 MG tablet Take 1 tablet (1 mg total) by mouth daily. 10/31/17   Fritzi Mandes, MD  gabapentin (NEURONTIN) 300 MG capsule Take 300 mg by mouth 2 (two) times daily.  08/16/16   [provider]  ipratropium-albuterol (DUONEB) 0.5-2.5 (3) MG/3ML SOLN Take 3 mLs by nebulization every 6 (six) hours as needed. 11/28/16   Loletha Grayer, MD  labetalol (NORMODYNE) 100 MG tablet Take 100 mg by mouth daily. 03/24/17   [provider]  multivitamin (RENA-VIT) TABS tablet Take 1 tablet by  mouth at bedtime. 10/31/17   Fritzi Mandes, MD  nitroGLYCERIN (NITROSTAT) 0.4 MG SL tablet Place 1 tablet (0.4 mg total) under the tongue every 5 (five) minutes as needed. 04/12/16   Wende Bushy, MD  omeprazole (PRILOSEC) 40 MG capsule Take 40 mg by mouth daily. 09/08/17   [provider]  Spacer/Aero Chamber Mouthpiece MISC 1 Units by Does not apply route every 4 (four) hours as needed (wheezing). 02/19/17   Darel Hong, MD  tiotropium (SPIRIVA HANDIHALER) 18 MCG inhalation capsule Place 1 capsule (18 mcg total) into inhaler and inhale daily. Patient not taking: Reported on 12/09/2017 11/16/16 12/09/17  Vaughan Basta, MD    Allergies Patient has no known allergies.  Family History  Problem Relation Age of Onset  . Colon cancer Mother   . Cancer Father   . Cancer Sister   . Kidney disease Brother     Social History Social History   Tobacco Use  . Smoking status: Current Every Day Smoker    Packs/day: 0.15  Years: 40.00    Pack years: 6.00    Types: Cigarettes  . Smokeless tobacco: Never Used  Substance Use Topics  . Alcohol use: No    Comment: pt reports quitting after learning about cirrhosis  . Drug use: No    Frequency: 7.0 times per week    Types: Marijuana, Cocaine    Review of Systems  Constitutional: No fever/chills Eyes: No visual changes. ENT: No sore throat. Cardiovascular: Denies chest pain. Respiratory:as above Gastrointestinal: No abdominal pain.  No nausea, no vomiting.  No diarrhea.  No constipation. Genitourinary: Negative for dysuria. Musculoskeletal: Negative for back pain. Skin: Negative for rash. Neurological: Negative for headaches, focal weakness or numbness.   ____________________________________________   PHYSICAL EXAM:  VITAL SIGNS: ED Triage Vitals  Enc Vitals Group     BP 12/23/17 1729 119/79     Pulse Rate 12/23/17 1729 83     Resp 12/23/17 1729 18     Temp 12/23/17 1729 98.4 F (36.9 C)     Temp Source  12/23/17 1729 Oral     SpO2 12/23/17 1729 98 %     Weight 12/23/17 1730 180 lb (81.6 kg)     Height 12/23/17 1730 6' (1.829 m)     Head Circumference --      Peak Flow --      Pain Score 12/23/17 1730 0     Pain Loc --      Pain Edu? --      Excl. in [place]? --     Constitutional: Alert and oriented. Well appearing and in no acute distress. Eyes: Conjunctivae are normal.  Head: Atraumatic. Nose: No congestion/rhinnorhea. Mouth/Throat: Mucous membranes are moist.  Neck: No stridor.   Cardiovascular: Normal rate, regular rhythm. Grossly normal heart sounds.  Respiratory: Normal respiratory effort.  No retractions.  diffuse wheezing, bilaterally with x-ray cough. Patient speaks in full sentences. Gastrointestinal: Soft and nontender.Abdomen is distended but nontender throughout. No CVA tenderness. Musculoskeletal: No lower extremity tenderness with bilateral minimal to moderate edema.  No joint effusions. Neurologic:  Normal speech and language. No gross focal neurologic deficits are appreciated. Skin:  Skin is warm, dry and intact. No rash noted. Psychiatric: Mood and affect are normal. Speech and behavior are normal.  ____________________________________________   LABS (all labs ordered are listed, but only abnormal results are displayed)  Labs Reviewed  CBC - Abnormal; Notable for the following components:      Result Value   Hemoglobin 11.3 (*)    MCH 24.5 (*)    MCHC 27.6 (*)    RDW 22.8 (*)    All other components within normal limits  COMPREHENSIVE METABOLIC PANEL   ____________________________________________  EKG  ED ECG REPORT I, Doran Stabler, the attending physician, personally viewed and interpreted this ECG.   Date: 12/23/2017  EKG Time: 1924  Rate: 74  Rhythm: normal sinus rhythm  Axis: normal  Intervals:nonspecific intraventricular conduction delay  ST&T Change: T-wave inversions in V2 and V3. No T-wave peaking. No ST elevations. no significant  change from previous. ____________________________________________  RADIOLOGY  chest x-ray with mild interstitial edema consistent with CHF/volume overload ____________________________________________   PROCEDURES  Procedure(s) performed:   Procedures  Critical Care performed:   ____________________________________________   INITIAL IMPRESSION / ASSESSMENT AND PLAN / ED COURSE  Pertinent labs & imaging results that were available during my care of the patient were reviewed by me and considered in my medical decision making (see chart for details).  Differential includes,  but is not limited to, viral syndrome, bronchitis including COPD exacerbation, pneumonia, reactive airway disease including asthma, CHF including exacerbation with or without pulmonary/interstitial edema, pneumothorax, ACS, thoracic trauma, and pulmonary embolism. As part of my medical decision making, I reviewed the following data within the electronic MEDICAL RECORD NUMBER Notes from prior ED visits  ----------------------------------------- 8:08 PM on 12/23/2017 -----------------------------------------  I discussed case with Dr. [name] of nephrology who agrees to see the patient and arrange for dialysis. Patient aware of need for admission to the hospital. Patient with wheezing and fluid overload likely related to his noncompliance with dialysis. He'll be admitted to the hospital. Patient with electrolytes hemolyzed 2. Pending repeat blood draw at this time. Signed out to Dr. Brett Albino the hospitalist service.  ____________________________________________   FINAL CLINICAL IMPRESSION(S) / ED DIAGNOSES  shortness of breath. pulmonary edema. Dialysis noncompliance.    NEW MEDICATIONS STARTED DURING THIS VISIT:  New Prescriptions   No medications on file     Note:  This document was prepared using Dragon voice recognition software and may include unintentional dictation errors.     Orbie Pyo, MD 12/23/17 2010

## 2017-12-23 NOTE — ED Notes (Signed)
Lab called that resent labs hemolyzed.  This RN asked lab to come draw for recollect.

## 2017-12-23 NOTE — ED Triage Notes (Signed)
Pt called ems for sob - missed his Thursday dialysis. Hx of dialysis and has care plan for such.

## 2017-12-24 ENCOUNTER — Encounter: Payer: Self-pay | Admitting: *Deleted

## 2017-12-24 ENCOUNTER — Other Ambulatory Visit: Payer: Self-pay

## 2017-12-24 DIAGNOSIS — J810 Acute pulmonary edema: Secondary | ICD-10-CM | POA: Diagnosis not present

## 2017-12-24 DIAGNOSIS — J449 Chronic obstructive pulmonary disease, unspecified: Secondary | ICD-10-CM | POA: Diagnosis not present

## 2017-12-24 DIAGNOSIS — R188 Other ascites: Secondary | ICD-10-CM | POA: Diagnosis not present

## 2017-12-24 DIAGNOSIS — N186 End stage renal disease: Secondary | ICD-10-CM | POA: Diagnosis not present

## 2017-12-24 DIAGNOSIS — E875 Hyperkalemia: Secondary | ICD-10-CM | POA: Diagnosis not present

## 2017-12-24 DIAGNOSIS — N2581 Secondary hyperparathyroidism of renal origin: Secondary | ICD-10-CM | POA: Diagnosis not present

## 2017-12-24 DIAGNOSIS — I10 Essential (primary) hypertension: Secondary | ICD-10-CM | POA: Diagnosis not present

## 2017-12-24 LAB — BASIC METABOLIC PANEL
Anion gap: 16 — ABNORMAL HIGH (ref 5–15)
Anion gap: 13 (ref 5–15)
BUN: 66 mg/dL — AB (ref 6–20)
BUN: 65 mg/dL — AB (ref 6–20)
CALCIUM: 9 mg/dL (ref 8.9–10.3)
CHLORIDE: 100 mmol/L (ref 98–111)
CO2: 20 mmol/L — AB (ref 22–32)
CO2: 24 mmol/L (ref 22–32)
CREATININE: 12.87 mg/dL — AB (ref 0.61–1.24)
Calcium: 8.4 mg/dL — ABNORMAL LOW (ref 8.9–10.3)
Chloride: 103 mmol/L (ref 98–111)
Creatinine, Ser: 12.56 mg/dL — ABNORMAL HIGH (ref 0.61–1.24)
GFR calc Af Amer: 4 mL/min — ABNORMAL LOW (ref >60)
GFR calc non Af Amer: 4 mL/min — ABNORMAL LOW (ref >60)
GFR calc non Af Amer: 4 mL/min — ABNORMAL LOW (ref >60)
GFR, EST AFRICAN AMERICAN: 4 mL/min — AB (ref >60)
GLUCOSE: 99 mg/dL (ref 70–99)
Glucose, Bld: 52 mg/dL — ABNORMAL LOW (ref 70–99)
POTASSIUM: 7.4 mmol/L — AB (ref 3.5–5.1)
Potassium: 6.7 mmol/L (ref 3.5–5.1)
SODIUM: 140 mmol/L (ref 135–145)
Sodium: 136 mmol/L (ref 135–145)

## 2017-12-24 LAB — CBC
HEMATOCRIT: 36.7 % — AB (ref 39.0–52.0)
HEMOGLOBIN: 10.4 g/dL — AB (ref 13.0–17.0)
MCH: 24.5 pg — AB (ref 26.0–34.0)
MCHC: 28.3 g/dL — AB (ref 30.0–36.0)
MCV: 86.6 fL (ref 80.0–100.0)
Platelets: 295 10*3/uL (ref 150–400)
RBC: 4.24 MIL/uL (ref 4.22–5.81)
RDW: 22.6 % — ABNORMAL HIGH (ref 11.5–15.5)
WBC: 8 10*3/uL (ref 4.0–10.5)
nRBC: 0 % (ref 0.0–0.2)

## 2017-12-24 LAB — GLUCOSE, RANDOM
GLUCOSE: 50 mg/dL — AB (ref 70–99)
Glucose, Bld: 94 mg/dL (ref 70–99)

## 2017-12-24 LAB — GLUCOSE, CAPILLARY
Glucose-Capillary: 41 mg/dL — CL (ref 70–99)
Glucose-Capillary: 41 mg/dL — CL (ref 70–99)
Glucose-Capillary: 92 mg/dL (ref 70–99)

## 2017-12-24 MED ORDER — ALBUTEROL SULFATE (2.5 MG/3ML) 0.083% IN NEBU
10.0000 mg | INHALATION_SOLUTION | Freq: Once | RESPIRATORY_TRACT | Status: AC
  Administered 2017-12-24: 10 mg via RESPIRATORY_TRACT
  Filled 2017-12-24: qty 12

## 2017-12-24 MED ORDER — HEPARIN SODIUM (PORCINE) 1000 UNIT/ML DIALYSIS
20.0000 [IU]/kg | INTRAMUSCULAR | Status: DC | PRN
  Filled 2017-12-24: qty 2

## 2017-12-24 MED ORDER — PATIROMER SORBITEX CALCIUM 8.4 G PO PACK
16.8000 g | PACK | Freq: Every day | ORAL | Status: DC
  Administered 2017-12-24 – 2017-12-26 (×3): 16.8 g via ORAL
  Filled 2017-12-24 (×4): qty 2

## 2017-12-24 MED ORDER — CHLORHEXIDINE GLUCONATE CLOTH 2 % EX PADS
6.0000 | MEDICATED_PAD | Freq: Every day | CUTANEOUS | Status: DC
  Administered 2017-12-24 – 2017-12-26 (×2): 6 via TOPICAL

## 2017-12-24 MED ORDER — DEXTROSE 50 % IV SOLN
INTRAVENOUS | Status: AC
  Filled 2017-12-24: qty 50

## 2017-12-24 MED ORDER — CYCLOBENZAPRINE HCL 10 MG PO TABS: 5.0000 mg | ORAL_TABLET | Freq: Four times a day (QID) | ORAL | Status: DC | PRN

## 2017-12-24 MED ORDER — PHENOL 1.4 % MT LIQD
1.0000 | OROMUCOSAL | Status: DC | PRN
  Administered 2017-12-24: 1 via OROMUCOSAL
  Filled 2017-12-24 (×2): qty 177

## 2017-12-24 MED ORDER — KETOROLAC TROMETHAMINE 10 MG PO TABS
10.0000 mg | ORAL_TABLET | Freq: Three times a day (TID) | ORAL | Status: DC | PRN
  Filled 2017-12-24: qty 1

## 2017-12-24 MED ORDER — NICOTINE 14 MG/24HR TD PT24
14.0000 mg | MEDICATED_PATCH | Freq: Every day | TRANSDERMAL | Status: DC
  Filled 2017-12-24 (×2): qty 1

## 2017-12-24 MED ORDER — SODIUM CHLORIDE 0.9 % IV SOLN
1.0000 g | Freq: Once | INTRAVENOUS | Status: AC
  Administered 2017-12-24: 1 g via INTRAVENOUS
  Filled 2017-12-24: qty 10

## 2017-12-24 MED ORDER — SODIUM POLYSTYRENE SULFONATE 15 GM/60ML PO SUSP
30.0000 g | Freq: Once | ORAL | Status: AC
  Administered 2017-12-24: 30 g via ORAL
  Filled 2017-12-24: qty 120

## 2017-12-24 MED ORDER — LIDOCAINE VISCOUS HCL 2 % MT SOLN
15.0000 mL | Freq: Once | OROMUCOSAL | Status: DC
  Filled 2017-12-24: qty 15

## 2017-12-24 MED ORDER — KETOROLAC TROMETHAMINE 10 MG PO TABS
50.0000 mg | ORAL_TABLET | Freq: Three times a day (TID) | ORAL | Status: DC | PRN
  Filled 2017-12-24: qty 5

## 2017-12-24 MED ORDER — GI COCKTAIL ~~LOC~~
30.0000 mL | Freq: Two times a day (BID) | ORAL | Status: DC | PRN
  Administered 2017-12-24: 30 mL via ORAL
  Filled 2017-12-24 (×3): qty 30

## 2017-12-24 MED ORDER — IBUPROFEN 400 MG PO TABS
400.0000 mg | ORAL_TABLET | Freq: Four times a day (QID) | ORAL | Status: DC | PRN
  Administered 2017-12-24: 400 mg via ORAL
  Filled 2017-12-24: qty 1

## 2017-12-24 MED ORDER — INSULIN ASPART 100 UNIT/ML IV SOLN
10.0000 [IU] | Freq: Once | INTRAVENOUS | Status: AC
  Administered 2017-12-24: 10 [IU] via INTRAVENOUS
  Filled 2017-12-24: qty 0.1

## 2017-12-24 MED ORDER — SODIUM BICARBONATE 8.4 % IV SOLN
50.0000 meq | Freq: Once | INTRAVENOUS | Status: AC
  Administered 2017-12-24: 50 meq via INTRAVENOUS
  Filled 2017-12-24: qty 50

## 2017-12-24 MED ORDER — DEXTROSE 50 % IV SOLN
1.0000 | Freq: Once | INTRAVENOUS | Status: AC
  Administered 2017-12-24: 50 mL via INTRAVENOUS
  Filled 2017-12-24: qty 50

## 2017-12-24 MED ORDER — DEXTROSE 50 % IV SOLN
1.0000 | Freq: Once | INTRAVENOUS | Status: AC
  Administered 2017-12-24: 50 mL via INTRAVENOUS

## 2017-12-24 NOTE — Progress Notes (Signed)
This note also relates to the following rows which could not be included: Pulse Rate - Cannot attach notes to unvalidated device data Resp - Cannot attach notes to unvalidated device data  Hd started  

## 2017-12-24 NOTE — Care Management Obs Status (Signed)
[facility] NOTIFICATION   Patient Details  Name: Stanley [name] MRN: [id number] Date of Birth: 05-03-1960   Medicare Observation Status Notification Given:  Yes    Hollin Crewe A Jerek Meulemans, RN 12/24/2017, 10:18 AM

## 2017-12-24 NOTE — Progress Notes (Signed)
[place] at [place] NAME: Stanley [name]    MR#:  [id number]  DATE OF BIRTH:  [birth year]  SUBJECTIVE:  CHIEF COMPLAINT:   Chief Complaint  Patient presents with  . Shortness of Breath  Patient admits to being noncompliant with hemodialysis-missed 2 treatments, noted continued hyperkalemia, for hemodialysis later today  REVIEW OF SYSTEMS:  CONSTITUTIONAL: No fever, fatigue or weakness.  EYES: No blurred or double vision.  EARS, NOSE, AND THROAT: No tinnitus or ear pain.  RESPIRATORY: No cough, shortness of breath, wheezing or hemoptysis.  CARDIOVASCULAR: No chest pain, orthopnea, edema.  GASTROINTESTINAL: No nausea, vomiting, diarrhea or abdominal pain.  GENITOURINARY: No dysuria, hematuria.  ENDOCRINE: No polyuria, nocturia,  HEMATOLOGY: No anemia, easy bruising or bleeding SKIN: No rash or lesion. MUSCULOSKELETAL: No joint pain or arthritis.   NEUROLOGIC: No tingling, numbness, weakness.  PSYCHIATRY: No anxiety or depression.   ROS  DRUG ALLERGIES:  No Known Allergies  VITALS:  Blood pressure 127/88, pulse 72, temperature 98.1 F (36.7 C), temperature source Oral, resp. rate 13, height 6' (1.829 m), weight 87.6 kg, SpO2 93 %.  PHYSICAL EXAMINATION:  GENERAL:  57 y.o.-year-old patient lying in the bed with no acute distress.  EYES: Pupils equal, round, reactive to light and accommodation. No scleral icterus. Extraocular muscles intact.  HEENT: Head atraumatic, normocephalic. Oropharynx and nasopharynx clear.  NECK:  Supple, no jugular venous distention. No thyroid enlargement, no tenderness.  LUNGS: Normal breath sounds bilaterally, no wheezing, rales,rhonchi or crepitation. No use of accessory muscles of respiration.  CARDIOVASCULAR: S1, S2 normal. No murmurs, rubs, or gallops.  ABDOMEN: Soft, nontender, nondistended. Bowel sounds present. No organomegaly or mass.  EXTREMITIES: No pedal edema, cyanosis, or clubbing.   NEUROLOGIC: Cranial nerves II through XII are intact. Muscle strength 5/5 in all extremities. Sensation intact. Gait not checked.  PSYCHIATRIC: The patient is alert and oriented x 3.  SKIN: No obvious rash, lesion, or ulcer.   Physical Exam LABORATORY PANEL:   CBC Recent Labs  Lab 12/24/17 0315  WBC 8.0  HGB 10.4*  HCT 36.7*  PLT 295   ------------------------------------------------------------------------------------------------------------------  Chemistries  Recent Labs  Lab 12/23/17 2011  12/24/17 0632 12/24/17 0840  NA 135   < > 140  --   K UNABLE TO RESULT DUE TO HEMOLYSIS   < > 6.7*  --   CL 102   < > 103  --   CO2 22   < > 24  --   GLUCOSE 125*   < > 50*  52* 94  BUN 66*   < > 65*  --   CREATININE 12.13*   < > 12.87*  --   CALCIUM 8.5*   < > 9.0  --   AST UNABLE TO RESULT DUE TO HEMOLYSIS  --   --   --   ALT UNABLE TO RESULT DUE TO HEMOLYSIS  --   --   --   ALKPHOS 153*  --   --   --   BILITOT UNABLE TO RESULT DUE TO HEMOLYSIS  --   --   --    < > = values in this interval not displayed.   ------------------------------------------------------------------------------------------------------------------  Cardiac Enzymes No results for input(s): TROPONINI in the last 168 hours. ------------------------------------------------------------------------------------------------------------------  RADIOLOGY:  Dg Chest 2 View  Result Date: 12/23/2017 CLINICAL DATA:  Shortness of breath. EXAM: CHEST - 2 VIEW COMPARISON:  Chest x-ray dated 12/09/2017. FINDINGS: Stable cardiomegaly. Dialysis catheter is  stable in position with tip at the level of the mid SVC. Mildly prominent interstitial markings noted bilaterally, slightly more prominent than on previous studies suggesting mild interstitial edema related to CHF. No confluent opacity to suggest pneumonia or alveolar pulmonary edema. No pleural effusion or pneumothorax seen. IMPRESSION: 1. Mild interstitial edema  indicating mild CHF/volume overload. 2. Stable cardiomegaly. Electronically Signed   By: Franki Cabot M.D.   On: 12/23/2017 18:59    ASSESSMENT AND PLAN:  *Acute pulmonary edema Due to fluid overload from noncompliance with hemodialysis for end-stage renal disease For hemodialysis later today  *Chronic end-stage renal disease Nephrology consulted for hemodialysis needs  *Acute hyperkalemia For hemodialysis later today Secondary to noncompliance with hemodialysis  *Chronic benign essential hypertension Stable on current regiment   *COPD without exacerbation  Stable  Continue breathing treatments as needed   *Chronic tobacco smoking abuse/dependency  Nicotine patch and cessation counseling ordered   Positional Home in 1 to 2 days barring any complications   All the records are reviewed and case discussed with Care Management/Social Workerr. Management plans discussed with the patient, family and they are in agreement.  CODE STATUS: full  TOTAL TIME TAKING CARE OF THIS PATIENT: 40 minutes.     POSSIBLE D/C IN 1-2 DAYS, DEPENDING ON CLINICAL CONDITION.   Avel Peace Latosha Gaylord M.D on 12/24/2017   Between 7am to 6pm - Pager - [phone]  After 6pm go to [email or web address] - password [email or web address] [facility]  Office  [phone]  CC: Primary care physician; [name], MD  Note: This dictation was prepared with Dragon dictation along with smaller phrase technology. Any transcriptional errors that result from this process are unintentional.

## 2017-12-24 NOTE — Progress Notes (Addendum)
Prime doc paged regarding critical potassium 7.4, orders given.

## 2017-12-24 NOTE — Progress Notes (Signed)
This note also relates to the following rows which could not be included: Pulse Rate - Cannot attach notes to unvalidated device data Resp - Cannot attach notes to unvalidated device data  Hd completed  

## 2017-12-24 NOTE — Progress Notes (Signed)
[facility]  ROUNDING NOTE   Subjective:  Patient missed his hemodialysis treatment on Thursday stating that he had to go out of town to pick up his daughter.  Did not reschedule for Friday and did not go to treatment on Saturday Now presents with hyperkalemia and volume overload Urgent hemodialysis requested   HEMODIALYSIS FLOWSHEET:  Blood Flow Rate (mL/min): 350 mL/min Arterial Pressure (mmHg): -160 mmHg Venous Pressure (mmHg): 160 mmHg Transmembrane Pressure (mmHg): 60 mmHg Ultrafiltration Rate (mL/min): 1000 mL/min Dialysate Flow Rate (mL/min): 800 ml/min Conductivity: Machine : 14.1 Conductivity: Machine : 14.1 Dialysis Fluid Bolus: Normal Saline Bolus Amount (mL): 250 mL Dialysate Change: 2K     Objective:  Vital signs in last 24 hours:  Temp:  [98.1 F (36.7 C)-98.9 F (37.2 C)] 98.1 F (36.7 C) (10/13 1055) Pulse Rate:  [72-87] 78 (10/13 1300) Resp:  [13-20] 18 (10/13 1300) BP: (117-137)/(74-99) 131/83 (10/13 1300) SpO2:  [93 %-100 %] 93 % (10/13 1055) Weight:  [81.6 kg-87.6 kg] 87.6 kg (10/13 0500)  Weight change:  Filed Weights   12/23/17 1730 12/24/17 0500  Weight: 81.6 kg 87.6 kg    Intake/Output: No intake/output data recorded.   Intake/Output this shift:  Total I/O In: 240 [P.O.:240] Out: -   Physical Exam: General: NAD, laying in bed  Head: Normocephalic, atraumatic. Moist oral mucosal membranes  Eyes: Anicteric,   Neck: Supple, trachea midline  Lungs:  Bilateral crackles, room air  Heart: S1S2 no rubs  Abdomen:  Soft, significantly distended, BS present  Extremities:  2+ peripheral edema.  Neurologic: Awake, alert, follows commands  Skin: No lesions  Access: RIJ permcath    Basic Metabolic Panel: Recent Labs  Lab 12/20/17 1423 12/23/17 2011 12/24/17 0315 12/24/17 0632 12/24/17 0840  NA 141 135 136 140  --   K 5.9* UNABLE TO RESULT DUE TO HEMOLYSIS 7.4* 6.7*  --   CL 104 102 100 103  --   CO2 26 22 20* 24  --    GLUCOSE 124* 125* 99 50*  52* 94  BUN 37* 66* 66* 65*  --   CREATININE 8.60* 12.13* 12.56* 12.87*  --   CALCIUM 8.7* 8.5* 8.4* 9.0  --     Liver Function Tests: Recent Labs  Lab 12/20/17 1423 12/23/17 2011  AST 21 UNABLE TO RESULT DUE TO HEMOLYSIS  ALT 19 UNABLE TO RESULT DUE TO HEMOLYSIS  ALKPHOS 113 153*  BILITOT 0.4 UNABLE TO RESULT DUE TO HEMOLYSIS  PROT 6.2* 6.3*  ALBUMIN 2.8* 3.0*   No results for input(s): LIPASE, AMYLASE in the last 168 hours. No results for input(s): AMMONIA in the last 168 hours.  CBC: Recent Labs  Lab 12/20/17 1423 12/23/17 1736 12/24/17 0315  WBC 7.4 6.8 8.0  NEUTROABS 5.5  --   --   HGB 9.7* 11.3* 10.4*  HCT 35.2* 40.9 36.7*  MCV 89.1 88.7 86.6  PLT 285 294 295    Cardiac Enzymes: No results for input(s): CKTOTAL, CKMB, CKMBINDEX, TROPONINI in the last 168 hours.  BNP: Invalid input(s): POCBNP  CBG: Recent Labs  Lab 12/24/17 0644 12/24/17 0648 12/24/17 0653 12/24/17 0740  GLUCAP 39* 41* 41* 92    Microbiology: Results for orders placed or performed during the hospital encounter of 11/18/17  MRSA PCR Screening     Status: None   Collection Time: 11/19/17  2:01 AM  Result Value Ref Range Status   MRSA by PCR NEGATIVE NEGATIVE Final    Comment:  The GeneXpert MRSA Assay (FDA approved for NASAL specimens only), is one component of a comprehensive MRSA colonization surveillance program. It is not intended to diagnose MRSA infection nor to guide or monitor treatment for MRSA infections. Performed at [facility], [address] [place], [place] [address]     Coagulation Studies: No results for input(s): LABPROT, INR in the last 72 hours.  Urinalysis: No results for input(s): COLORURINE, LABSPEC, PHURINE, GLUCOSEU, HGBUR, BILIRUBINUR, KETONESUR, PROTEINUR, UROBILINOGEN, NITRITE, LEUKOCYTESUR in the last 72 hours.  Invalid input(s): APPERANCEUR    Imaging: Dg Chest 2 View  Result Date:  12/23/2017 CLINICAL DATA:  Shortness of breath. EXAM: CHEST - 2 VIEW COMPARISON:  Chest x-ray dated 12/09/2017. FINDINGS: Stable cardiomegaly. Dialysis catheter is stable in position with tip at the level of the mid SVC. Mildly prominent interstitial markings noted bilaterally, slightly more prominent than on previous studies suggesting mild interstitial edema related to CHF. No confluent opacity to suggest pneumonia or alveolar pulmonary edema. No pleural effusion or pneumothorax seen. IMPRESSION: 1. Mild interstitial edema indicating mild CHF/volume overload. 2. Stable cardiomegaly. Electronically Signed   By: Franki Cabot M.D.   On: 12/23/2017 18:59     Medications:    . calcium acetate  2,001 mg Oral TID WC  . Chlorhexidine Gluconate Cloth  6 each Topical Q0600  . dextrose      . docusate sodium  100 mg Oral BID  . ferrous sulfate  325 mg Oral TID  . fluticasone furoate-vilanterol  1 puff Inhalation Daily  . folic acid  1 mg Oral Daily  . heparin  5,000 Units Subcutaneous Q8H  . labetalol  100 mg Oral Daily  . multivitamin  1 tablet Oral QHS  . nicotine  14 mg Transdermal Daily  . pantoprazole  40 mg Oral Daily  . patiromer  16.8 g Oral Daily  . tiotropium  18 mcg Inhalation Daily     Assessment/ Plan:  [name] is a 57 y.o. black male withend stage renal disease on hemodialysis secondary to Alport's syndrome, ascites, hypertension, anemia of chronic kidney disease, coronary artery disease, peripheral vascular disease, hyperlipidemia, gastrointestinal AVMs, pulmonary hypertension  [facility] TTS EDW 83 kg  1.    Hyperkalemia in the setting of end-stage renal disease -Hemodialysis today.  Volume removal 4 to 5 kg as tolerated -2 K bath  2.  Acute pulm edema/Volume overload -Fluid removal with hemodialysis as tolerated  3.   Ascites  Patient underwent paracentesis on September 30 as well as today.  5 L removed  May need another paracentesis tomorrow  for tense ascites  4.  Anemia of chronic kidney disease:   - EPO with HD treatment, hemoglobin 10.4  5.  Secondary hyperparathyroidism - Maintain the patient on calcium acetate.   LOS: 0 Stanley [name] 10/13/20191:07 PM

## 2017-12-24 NOTE — Progress Notes (Signed)
Renally adjusted prn Ketorolac from 50 mg q8h prn to 10 mg q8h prn    Paticia Stack, PharmD Pharmacy Resident  12/24/2017 4:40 PM

## 2017-12-25 ENCOUNTER — Inpatient Hospital Stay: Payer: Medicare Other

## 2017-12-25 DIAGNOSIS — Z951 Presence of aortocoronary bypass graft: Secondary | ICD-10-CM | POA: Diagnosis not present

## 2017-12-25 DIAGNOSIS — N2581 Secondary hyperparathyroidism of renal origin: Secondary | ICD-10-CM | POA: Diagnosis present

## 2017-12-25 DIAGNOSIS — K219 Gastro-esophageal reflux disease without esophagitis: Secondary | ICD-10-CM | POA: Diagnosis present

## 2017-12-25 DIAGNOSIS — J810 Acute pulmonary edema: Secondary | ICD-10-CM | POA: Diagnosis not present

## 2017-12-25 DIAGNOSIS — I10 Essential (primary) hypertension: Secondary | ICD-10-CM | POA: Diagnosis not present

## 2017-12-25 DIAGNOSIS — Z992 Dependence on renal dialysis: Secondary | ICD-10-CM | POA: Diagnosis not present

## 2017-12-25 DIAGNOSIS — J449 Chronic obstructive pulmonary disease, unspecified: Secondary | ICD-10-CM | POA: Diagnosis present

## 2017-12-25 DIAGNOSIS — I739 Peripheral vascular disease, unspecified: Secondary | ICD-10-CM | POA: Diagnosis present

## 2017-12-25 DIAGNOSIS — Q8781 Alport syndrome: Secondary | ICD-10-CM | POA: Diagnosis not present

## 2017-12-25 DIAGNOSIS — Z5329 Procedure and treatment not carried out because of patient's decision for other reasons: Secondary | ICD-10-CM | POA: Diagnosis present

## 2017-12-25 DIAGNOSIS — E785 Hyperlipidemia, unspecified: Secondary | ICD-10-CM | POA: Diagnosis present

## 2017-12-25 DIAGNOSIS — D631 Anemia in chronic kidney disease: Secondary | ICD-10-CM | POA: Diagnosis present

## 2017-12-25 DIAGNOSIS — F17210 Nicotine dependence, cigarettes, uncomplicated: Secondary | ICD-10-CM | POA: Diagnosis present

## 2017-12-25 DIAGNOSIS — I5033 Acute on chronic diastolic (congestive) heart failure: Secondary | ICD-10-CM | POA: Diagnosis present

## 2017-12-25 DIAGNOSIS — I472 Ventricular tachycardia: Secondary | ICD-10-CM | POA: Diagnosis not present

## 2017-12-25 DIAGNOSIS — I110 Hypertensive heart disease with heart failure: Secondary | ICD-10-CM | POA: Diagnosis present

## 2017-12-25 DIAGNOSIS — Z9119 Patient's noncompliance with other medical treatment and regimen: Secondary | ICD-10-CM | POA: Diagnosis not present

## 2017-12-25 DIAGNOSIS — Z8719 Personal history of other diseases of the digestive system: Secondary | ICD-10-CM | POA: Diagnosis not present

## 2017-12-25 DIAGNOSIS — E875 Hyperkalemia: Secondary | ICD-10-CM | POA: Diagnosis present

## 2017-12-25 DIAGNOSIS — N186 End stage renal disease: Secondary | ICD-10-CM | POA: Diagnosis present

## 2017-12-25 DIAGNOSIS — E8770 Fluid overload, unspecified: Secondary | ICD-10-CM | POA: Diagnosis not present

## 2017-12-25 DIAGNOSIS — I2510 Atherosclerotic heart disease of native coronary artery without angina pectoris: Secondary | ICD-10-CM | POA: Diagnosis present

## 2017-12-25 DIAGNOSIS — R188 Other ascites: Secondary | ICD-10-CM | POA: Diagnosis present

## 2017-12-25 DIAGNOSIS — I272 Pulmonary hypertension, unspecified: Secondary | ICD-10-CM | POA: Diagnosis present

## 2017-12-25 DIAGNOSIS — I454 Nonspecific intraventricular block: Secondary | ICD-10-CM | POA: Diagnosis present

## 2017-12-25 DIAGNOSIS — J811 Chronic pulmonary edema: Secondary | ICD-10-CM | POA: Diagnosis not present

## 2017-12-25 DIAGNOSIS — Z9115 Patient's noncompliance with renal dialysis: Secondary | ICD-10-CM | POA: Diagnosis not present

## 2017-12-25 DIAGNOSIS — K7460 Unspecified cirrhosis of liver: Secondary | ICD-10-CM | POA: Diagnosis present

## 2017-12-25 LAB — BASIC METABOLIC PANEL
ANION GAP: 14 (ref 5–15)
BUN: 35 mg/dL — AB (ref 6–20)
CO2: 25 mmol/L (ref 22–32)
CREATININE: 9.32 mg/dL — AB (ref 0.61–1.24)
Calcium: 7.7 mg/dL — ABNORMAL LOW (ref 8.9–10.3)
Chloride: 98 mmol/L (ref 98–111)
GFR calc Af Amer: 6 mL/min — ABNORMAL LOW (ref >60)
GFR, EST NON AFRICAN AMERICAN: 5 mL/min — AB (ref >60)
GLUCOSE: 86 mg/dL (ref 70–99)
Potassium: 5.2 mmol/L — ABNORMAL HIGH (ref 3.5–5.1)
Sodium: 137 mmol/L (ref 135–145)

## 2017-12-25 LAB — GLUCOSE, CAPILLARY
GLUCOSE-CAPILLARY: 39 mg/dL — AB (ref 70–99)
GLUCOSE-CAPILLARY: 82 mg/dL (ref 70–99)

## 2017-12-25 MED ORDER — METHYLPREDNISOLONE SODIUM SUCC 125 MG IJ SOLR
60.0000 mg | Freq: Four times a day (QID) | INTRAMUSCULAR | Status: DC
  Administered 2017-12-26 (×2): 60 mg via INTRAVENOUS
  Filled 2017-12-25 (×2): qty 2

## 2017-12-25 MED ORDER — DIPHENHYDRAMINE HCL 50 MG/ML IJ SOLN
12.5000 mg | Freq: Once | INTRAMUSCULAR | Status: AC
  Administered 2017-12-25: 12.5 mg via INTRAVENOUS

## 2017-12-25 MED ORDER — IPRATROPIUM-ALBUTEROL 0.5-2.5 (3) MG/3ML IN SOLN
3.0000 mL | Freq: Four times a day (QID) | RESPIRATORY_TRACT | Status: DC
  Administered 2017-12-25 – 2017-12-26 (×2): 3 mL via RESPIRATORY_TRACT
  Filled 2017-12-25 (×2): qty 3

## 2017-12-25 MED ORDER — ALUM & MAG HYDROXIDE-SIMETH 200-200-20 MG/5ML PO SUSP
30.0000 mL | ORAL | Status: DC | PRN
  Administered 2017-12-25 – 2017-12-26 (×2): 30 mL via ORAL
  Filled 2017-12-25 (×2): qty 30

## 2017-12-25 MED ORDER — METHYLPREDNISOLONE SODIUM SUCC 125 MG IJ SOLR
125.0000 mg | Freq: Once | INTRAMUSCULAR | Status: AC
  Administered 2017-12-25: 125 mg via INTRAVENOUS
  Filled 2017-12-25: qty 2

## 2017-12-25 MED ORDER — BUDESONIDE 0.25 MG/2ML IN SUSP
0.2500 mg | Freq: Two times a day (BID) | RESPIRATORY_TRACT | Status: DC
  Administered 2017-12-25 – 2017-12-26 (×2): 0.25 mg via RESPIRATORY_TRACT
  Filled 2017-12-25 (×2): qty 2

## 2017-12-25 MED ORDER — ALBUMIN HUMAN 25 % IV SOLN
12.5000 g | Freq: Once | INTRAVENOUS | Status: AC
  Administered 2017-12-25: 12.5 g via INTRAVENOUS
  Filled 2017-12-25: qty 50

## 2017-12-25 MED ORDER — IPRATROPIUM-ALBUTEROL 0.5-2.5 (3) MG/3ML IN SOLN
3.0000 mL | RESPIRATORY_TRACT | Status: DC | PRN
  Administered 2017-12-25: 3 mL via RESPIRATORY_TRACT
  Filled 2017-12-25 (×2): qty 3

## 2017-12-25 NOTE — Progress Notes (Signed)
Pre HD assessment    12/25/17 1419  Neurological  Level of Consciousness Alert  Orientation Level Oriented X4  Respiratory  Respiratory Pattern Regular;Unlabored  Chest Assessment Chest expansion symmetrical  Bilateral Breath Sounds Expiratory wheezes  Cardiac  ECG Monitor Yes  Cardiac Rhythm NSR  Vascular  R Radial Pulse +2  L Radial Pulse +2  Edema Generalized  Integumentary  Integumentary (WDL) X  Skin Color Appropriate for ethnicity  Musculoskeletal  Musculoskeletal (WDL) X  Generalized Weakness Yes  Assistive Device None  GU Assessment  Genitourinary (WDL) X  Genitourinary Symptoms  (HD)  Psychosocial  Psychosocial (WDL) WDL  Needs Expressed Physical  Emotional support given Given to patient

## 2017-12-25 NOTE — Progress Notes (Signed)
Post HD assessment. Pt tolerated tx well without c/o or complication. Pt asked to come off of HD tx early, with 17 minutes remaining, MD aware. Net UF 3579, goal met.    12/25/17 1811  Vital Signs  Temp 99.6 F (37.6 C)  Temp Source Oral  Pulse Rate 91  Pulse Rate Source Monitor  Resp 15  BP 127/65  BP Location Left Arm  BP Method Automatic  Patient Position (if appropriate) Lying  Oxygen Therapy  SpO2 91 %  O2 Device Room Air  Dialysis Weight  Weight 79.1 kg  Type of Weight Post-Dialysis  Post-Hemodialysis Assessment  Rinseback Volume (mL) 250 mL  KECN 71.2 V  Dialyzer Clearance Lightly streaked  Duration of HD Treatment -hour(s) 3.5 hour(s)  Hemodialysis Intake (mL) 750 mL  UF Total -Machine (mL) 4329 mL  Net UF (mL) 3579 mL  Tolerated HD Treatment Yes  Education / Care Plan  Dialysis Education Provided Yes  Documented Education in Care Plan Yes  Hemodialysis Catheter Right Internal jugular Double-lumen  No Placement Date or Time found.   Placed prior to admission: Yes  Orientation: Right  Access Location: Internal jugular  Hemodialysis Catheter Type: Double-lumen  Site Condition No complications  Blue Lumen Status Heparin locked  Red Lumen Status Heparin locked  Purple Lumen Status N/A  Catheter fill solution Heparin 1000 units/ml  Catheter fill volume (Arterial) 1.5 cc  Catheter fill volume (Venous) 1.5  Dressing Type Biopatch  Dressing Status Clean;Dry;Intact  Drainage Description None  Post treatment catheter status Capped and Clamped

## 2017-12-25 NOTE — Progress Notes (Signed)
HD tx resumed   12/25/17 1707  Vital Signs  Pulse Rate 89  Resp 18  Oxygen Therapy  SpO2 91 %

## 2017-12-25 NOTE — Progress Notes (Signed)
Notified by Central tele of a 5 beat run of VTach.

## 2017-12-25 NOTE — Care Management (Signed)
Amanda Morris HD liaison notified of admission and discharge 

## 2017-12-25 NOTE — Progress Notes (Signed)
HD tx end    12/25/17 1801  Vital Signs  Pulse Rate 87  Pulse Rate Source Monitor  Resp 18  BP 129/80  BP Location Left Arm  BP Method Automatic  Patient Position (if appropriate) Lying  Oxygen Therapy  SpO2 90 %  O2 Device Room Air  During Hemodialysis Assessment  Dialysis Fluid Bolus Normal Saline  Bolus Amount (mL) 250 mL  Intra-Hemodialysis Comments Tx completed

## 2017-12-25 NOTE — Discharge Summary (Signed)
[facility] NAME: Stanley [name]    MR#:  [id number]  DATE OF BIRTH:  [birth year]  DATE OF ADMISSION:  12/23/2017 ADMITTING PHYSICIAN: Amelia Jo, MD  DATE OF DISCHARGE: No discharge date for patient encounter.  PRIMARY CARE PHYSICIAN: Revelo, Elyse Jarvis, MD    ADMISSION DIAGNOSIS:  Acute pulmonary edema ([place]) [J81.0] ESRD on hemodialysis ([place]) [N18.6, Z99.2]  DISCHARGE DIAGNOSIS:  Active Problems:   Acute pulmonary edema ([name])   SECONDARY DIAGNOSIS:   Past Medical History:  Diagnosis Date  . Alcohol abuse   . CHF (congestive heart failure) ([place])   . Cirrhosis ([place])   . Coronary artery disease 2009  . Drug abuse ([place])   . End stage renal disease on dialysis [facility]) NEPHROLOGIST-   [facility]  IN    HEMODIALYSIS --   TUES/  THURS/  SAT  . Gastrointestinal bleed 06/13/2017   From chart...hx of multiple GI bleeds  . GERD (gastroesophageal reflux disease)   . Hyperlipidemia   . Hypertension   . PAD (peripheral artery disease) ([place])   . Renal insufficiency    Per pt, 32 oz fluid restriction per day  . S/P triple vessel bypass 06/09/2016   2009ish  . Suicidal ideation    & HOMICIDAL IDEATION --  06-16-2013   ADMITTED TO [facility] COURSE:  *Acute pulmonary edema Resolved Due to fluid overload from noncompliance with hemodialysis for end-stage renal disease Patient received hemodialysis on October 13 and will receive hemodialysis prior to discharge and discussion with nephrology  *Chronic end-stage renal disease Stable Nephrology consulted for hemodialysis needs  *Acute hyperkalemia Resolved Secondary to noncompliance with hemodialysis Treated with hemodialysis  *Chronic benign essential hypertension Stable on current regiment   *COPD without exacerbation  Stable  Continue breathing treatments as needed   *Chronic tobacco smoking abuse/dependency  Nicotine patch  and cessation counseling ordered   Long-term prognosis is poor due to noncompliance with medical management/hemodialysis  DISCHARGE CONDITIONS:   stable  CONSULTS OBTAINED:  Treatment Team:  Murlean Iba, MD  DRUG ALLERGIES:  No Known Allergies  DISCHARGE MEDICATIONS:   Allergies as of 12/25/2017   No Known Allergies     Medication List    TAKE these medications   budesonide-formoterol 160-4.5 MCG/ACT inhaler Commonly known as:  SYMBICORT Inhale 2 puffs into the lungs daily.   calcium acetate 667 MG capsule Commonly known as:  PHOSLO Take 2,001 mg by mouth 3 (three) times daily.   ferrous sulfate 325 (65 FE) MG tablet Take 1 tablet by mouth 3 (three) times daily.   folic acid 1 MG tablet Commonly known as:  FOLVITE Take 1 tablet (1 mg total) by mouth daily.   gabapentin 300 MG capsule Commonly known as:  NEURONTIN Take 300 mg by mouth 2 (two) times daily.   ipratropium-albuterol 0.5-2.5 (3) MG/3ML Soln Commonly known as:  DUONEB Take 3 mLs by nebulization every 6 (six) hours as needed.   labetalol 100 MG tablet Commonly known as:  NORMODYNE Take 100 mg by mouth daily.   multivitamin Tabs tablet Take 1 tablet by mouth at bedtime.   nitroGLYCERIN 0.4 MG SL tablet Commonly known as:  NITROSTAT Place 1 tablet (0.4 mg total) under the tongue every 5 (five) minutes as needed.   omeprazole 40 MG capsule Commonly known as:  PRILOSEC Take 40 mg by mouth daily.   Spacer/Aero Chamber Mouthpiece Misc 1 Units by Does not apply  route every 4 (four) hours as needed (wheezing).   tiotropium 18 MCG inhalation capsule Commonly known as:  SPIRIVA Place 1 capsule (18 mcg total) into inhaler and inhale daily.        DISCHARGE INSTRUCTIONS:  If you experience worsening of your admission symptoms, develop shortness of breath, life threatening emergency, suicidal or homicidal thoughts you must seek medical attention immediately by calling 911 or calling your MD  immediately  if symptoms less severe.  You Must read complete instructions/literature along with all the possible adverse reactions/side effects for all the Medicines you take and that have been prescribed to you. Take any new Medicines after you have completely understood and accept all the possible adverse reactions/side effects.   Please note  You were cared for by a hospitalist during your hospital stay. If you have any questions about your discharge medications or the care you received while you were in the hospital after you are discharged, you can call the unit and asked to speak with the hospitalist on call if the hospitalist that took care of you is not available. Once you are discharged, your primary care physician will handle any further medical issues. Please note that NO REFILLS for any discharge medications will be authorized once you are discharged, as it is imperative that you return to your primary care physician (or establish a relationship with a primary care physician if you do not have one) for your aftercare needs so that they can reassess your need for medications and monitor your lab values.    Today   CHIEF COMPLAINT:   Chief Complaint  Patient presents with  . Shortness of Breath    HISTORY OF PRESENT ILLNESS:  57 y.o. male with a known history of CHF, end-stage renal disease, CAD and prior history of drug abuse. Patient presented to emergency room for acute onset of shortness of breath going on for the past 24 hours.  Patient admits that he was unable to attend the past 2 dialysis sessions. Blood test done emergency room, including CBC and BMP grossly at baseline, although unable to see potassium level hemolyzed sample.  Repeat sample is pending results. EKG shows normal sinus rhythm with heart rate at 74, no acute ischemic changes. Chest x-ray shows fluid overload. Patient is admitted for further evaluation and treatment.  VITAL SIGNS:  Blood pressure 117/78,  pulse 87, temperature 98.3 F (36.8 C), temperature source Oral, resp. rate 12, height 6' (1.829 m), weight 87.6 kg, SpO2 97 %.  I/O:    Intake/Output Summary (Last 24 hours) at 12/25/2017 1143 Last data filed at 12/25/2017 0453 Gross per 24 hour  Intake 120 ml  Output 3500 ml  Net -3380 ml    PHYSICAL EXAMINATION:  GENERAL:  57 y.o.-year-old patient lying in the bed with no acute distress.  EYES: Pupils equal, round, reactive to light and accommodation. No scleral icterus. Extraocular muscles intact.  HEENT: Head atraumatic, normocephalic. Oropharynx and nasopharynx clear.  NECK:  Supple, no jugular venous distention. No thyroid enlargement, no tenderness.  LUNGS: Normal breath sounds bilaterally, no wheezing, rales,rhonchi or crepitation. No use of accessory muscles of respiration.  CARDIOVASCULAR: S1, S2 normal. No murmurs, rubs, or gallops.  ABDOMEN: Soft, non-tender, non-distended. Bowel sounds present. No organomegaly or mass.  EXTREMITIES: No pedal edema, cyanosis, or clubbing.  NEUROLOGIC: Cranial nerves II through XII are intact. Muscle strength 5/5 in all extremities. Sensation intact. Gait not checked.  PSYCHIATRIC: The patient is alert and oriented x 3.  SKIN: No obvious rash, lesion, or ulcer.   DATA REVIEW:   CBC Recent Labs  Lab 12/24/17 0315  WBC 8.0  HGB 10.4*  HCT 36.7*  PLT 295    Chemistries  Recent Labs  Lab 12/23/17 2011  12/25/17 0816  NA 135   < > 137  K UNABLE TO RESULT DUE TO HEMOLYSIS   < > 5.2*  CL 102   < > 98  CO2 22   < > 25  GLUCOSE 125*   < > 86  BUN 66*   < > 35*  CREATININE 12.13*   < > 9.32*  CALCIUM 8.5*   < > 7.7*  AST UNABLE TO RESULT DUE TO HEMOLYSIS  --   --   ALT UNABLE TO RESULT DUE TO HEMOLYSIS  --   --   ALKPHOS 153*  --   --   BILITOT UNABLE TO RESULT DUE TO HEMOLYSIS  --   --    < > = values in this interval not displayed.    Cardiac Enzymes No results for input(s): TROPONINI in the last 168  hours.  Microbiology Results  Results for orders placed or performed during the hospital encounter of 11/18/17  MRSA PCR Screening     Status: None   Collection Time: 11/19/17  2:01 AM  Result Value Ref Range Status   MRSA by PCR NEGATIVE NEGATIVE Final    Comment:        The GeneXpert MRSA Assay (FDA approved for NASAL specimens only), is one component of a comprehensive MRSA colonization surveillance program. It is not intended to diagnose MRSA infection nor to guide or monitor treatment for MRSA infections. Performed at [facility], [address], [place], [place] [address]     RADIOLOGY:  Dg Chest 2 View  Result Date: 12/23/2017 CLINICAL DATA:  Shortness of breath. EXAM: CHEST - 2 VIEW COMPARISON:  Chest x-ray dated 12/09/2017. FINDINGS: Stable cardiomegaly. Dialysis catheter is stable in position with tip at the level of the mid SVC. Mildly prominent interstitial markings noted bilaterally, slightly more prominent than on previous studies suggesting mild interstitial edema related to CHF. No confluent opacity to suggest pneumonia or alveolar pulmonary edema. No pleural effusion or pneumothorax seen. IMPRESSION: 1. Mild interstitial edema indicating mild CHF/volume overload. 2. Stable cardiomegaly. Electronically Signed   By: Franki Cabot M.D.   On: 12/23/2017 18:59   US Paracentesis  Result Date: 12/25/2017 INDICATION: Recurrent ascites, renal failure, abdominal distension EXAM: ULTRASOUND GUIDED left PARACENTESIS MEDICATIONS: 1% lidocaine local COMPLICATIONS: None immediate. PROCEDURE: Informed written consent was obtained from the patient after a discussion of the risks, benefits and alternatives to treatment. A timeout was performed prior to the initiation of the procedure. Initial ultrasound scanning demonstrates a large amount of ascites within the left lower abdominal quadrant. The right lower abdomen was prepped and draped in the usual sterile fashion. 1%  lidocaine with epinephrine was used for local anesthesia. Following this, a 6 Fr Safe-T-Centesis catheter was introduced. An ultrasound image was saved for documentation purposes. The paracentesis was performed. The catheter was removed and a dressing was applied. The patient tolerated the procedure well without immediate post procedural complication. FINDINGS: A total of approximately 6.75 of clear peritoneal fluid was removed. Sample was not sent for laboratory analysis IMPRESSION: Successful ultrasound-guided paracentesis yielding 6.75 liters of peritoneal fluid. Electronically Signed   By: Jerilynn Mages.  Shick M.D.   On: 12/25/2017 10:58    EKG:   Orders placed  or performed during the hospital encounter of 12/23/17  . ED EKG  . ED EKG  . EKG 12-Lead  . EKG 12-Lead      Management plans discussed with the patient, family and they are in agreement.  CODE STATUS:     Code Status Orders  (From admission, onward)         Start     Ordered   12/23/17 2225  Full code  Continuous     12/23/17 2225        Code Status History    Date Active Date Inactive Code Status Order ID Comments User Context   12/09/2017 1043 12/11/2017 1617 Full Code [id number]  Epifanio Lesches, MD ED   11/27/2017 1122 11/28/2017 1827 Full Code [id number]  Demetrios Loll, MD Inpatient   11/18/2017 2008 11/20/2017 1853 Full Code [id number]  Saundra Shelling, MD Inpatient   10/30/2017 0007 10/31/2017 1724 Full Code [id number]  Amelia Jo, MD Inpatient   06/12/2017 1707 06/16/2017 1704 Full Code [id number]  Nicholes Mango, MD Inpatient   06/02/2017 1603 06/06/2017 2222 Full Code [id number]  Demetrios Loll, MD Inpatient   05/24/2017 1831 05/25/2017 1921 Full Code [id number]  Saundra Shelling, MD ED   05/03/2017 1331 05/05/2017 1806 Full Code [id number]  Bettey Costa, MD Inpatient   04/01/2017 0947 04/03/2017 1156 Full Code [id number]  Loletha Grayer, MD ED   03/09/2017 0010 03/09/2017 1928 Full Code [id number]  Lance Coon, MD Inpatient   03/03/2017 2027  03/04/2017 1920 Full Code [id number]  Demetrios Loll, MD Inpatient   02/28/2017 2254 03/02/2017 1519 Full Code [id number]  Lance Coon, MD Inpatient   02/22/2017 1721 02/24/2017 1359 Full Code [id number]  Dustin Flock, MD Inpatient   01/22/2017 2337 01/25/2017 1522 Full Code [id number]  Gorden Harms, MD Inpatient   01/07/2017 1445 01/13/2017 1812 Full Code [id number]  Idelle Crouch, MD Inpatient   12/22/2016 1431 12/24/2016 1804 Full Code [id number]  Hillary Bow, MD ED   12/20/2016 2020 12/21/2016 1853 Full Code [id number]  Demetrios Loll, MD Inpatient   11/26/2016 1030 11/28/2016 1522 Full Code [id number]  Henreitta Leber, MD Inpatient   11/15/2016 1930 11/16/2016 1822 Full Code [id number]  Fritzi Mandes, MD Inpatient   10/29/2016 1453 10/30/2016 1506 Full Code [id number]  Loletha Grayer, MD ED   09/15/2016 1757 09/16/2016 1817 Full Code [id number]  Dustin Flock, MD ED   08/16/2016 0945 08/18/2016 1816 Full Code [id number]  Hillary Bow, MD ED   08/09/2016 1659 08/12/2016 2034 Full Code [id number]  Fritzi Mandes, MD Inpatient   07/03/2016 2154 07/05/2016 2122 Full Code [id number]  Demetrios Loll, MD Inpatient   06/24/2016 0423 06/26/2016 1546 Full Code [id number]  Saundra Shelling, MD Inpatient   06/14/2016 1453 06/19/2016 2104 Full Code [id number]  Hillary Bow, MD ED   05/31/2016 0642 06/01/2016 2130 Full Code [id number]  Saundra Shelling, MD Inpatient   05/05/2016 2309 05/10/2016 2157 Full Code [id number]  Vianne Bulls, MD Inpatient   04/16/2016 0356 04/17/2016 1827 Full Code [id number]  Saundra Shelling, MD Inpatient   01/19/2016 1513 01/22/2016 1414 Full Code [id number]  Loletha Grayer, MD ED   09/24/2015 1033 09/25/2015 1331 Full Code [id number]  Loletha Grayer, MD ED   05/16/2015 1417 05/17/2015 2057 Full Code [id number]  Idelle Crouch, MD Inpatient   04/30/2015 2008 05/08/2015 1940 Full Code [id number]  Vaughan Basta, MD Inpatient   04/06/2015 0507 04/07/2015 1736 Full Code [id number]  Lance Coon, MD Inpatient   03/23/2015 1107  03/27/2015 1246 Full Code [id number]  Bettey Costa, MD Inpatient   06/19/2013 1132 06/21/2013 1749 Full Code [id number]  Cristal Ford, DO Inpatient   06/16/2013 1726 06/19/2013 1132 Full Code [id number]  Margarita Mail, PA-C ED   06/05/2013 1533 06/06/2013 1856 Full Code [id number]  Theodoro Kos, DO Inpatient   06/02/2013 0901 06/05/2013 1533 Full Code [id number]  Velvet Bathe, MD Inpatient   06/02/2013 0840 06/02/2013 0901 Full Code [id number]  Velvet Bathe, MD Inpatient      TOTAL TIME TAKING CARE OF THIS PATIENT: 40 minutes.    Avel Peace Miaisabella Bacorn M.D on 12/25/2017 at 11:43 AM  Between 7am to 6pm - Pager - [phone]  After 6pm go to [email or web address] - password [email or web address] [facility]  Office  [phone]  CC: Primary care physician; [name], MD   Note: This dictation was prepared with Dragon dictation along with smaller phrase technology. Any transcriptional errors that result from this process are unintentional.

## 2017-12-25 NOTE — Progress Notes (Signed)
Prime doc notified pt wheezing after breathing tx, no orders given.

## 2017-12-25 NOTE — Progress Notes (Signed)
HD tx paused. System clotted, unable to rinse pt back, estimated blood loss 250-371ml. New setup required.    12/25/17 1652  Vital Signs  Pulse Rate 92  Resp 15  Oxygen Therapy  SpO2 91 %

## 2017-12-25 NOTE — Progress Notes (Signed)
[facility]  ROUNDING NOTE   Subjective:   Patient underwent ultrasound-guided paracentesis today.  6.75 L  Still has cough and congestion 3500 cc removed with hemodialysis yesterday Urgent hemodialysis was done yesterday.  Potassium corrected to 5.2 today  Objective:  Vital signs in last 24 hours:  Temp:  [97.4 F (36.3 C)-98.3 F (36.8 C)] 98.3 F (36.8 C) (10/14 1056) Pulse Rate:  [72-87] 87 (10/14 1056) Resp:  [12-25] 12 (10/14 1056) BP: (98-139)/(68-92) 117/78 (10/14 1056) SpO2:  [92 %-97 %] 97 % (10/14 1056)  Weight change:  Filed Weights   12/23/17 1730 12/24/17 0500  Weight: 81.6 kg 87.6 kg    Intake/Output: I/O last 3 completed shifts: In: 360 [P.O.:360] Out: 3500 [Other:3500]   Intake/Output this shift:  No intake/output data recorded.  Physical Exam: General: NAD, laying in bed  Head: Normocephalic, atraumatic. Moist oral mucosal membranes  Eyes: Anicteric,   Neck: Supple, trachea midline  Lungs:  Bilateral crackles, room air  Heart: S1S2 no rubs  Abdomen:  Soft, significantly distended, BS present  Extremities:  2+ peripheral edema.  Neurologic: Awake, alert, follows commands  Skin: No lesions  Access: RIJ permcath    Basic Metabolic Panel: Recent Labs  Lab 12/20/17 1423 12/23/17 2011 12/24/17 0315 12/24/17 0632 12/24/17 0840 12/25/17 0816  NA 141 135 136 140  --  137  K 5.9* UNABLE TO RESULT DUE TO HEMOLYSIS 7.4* 6.7*  --  5.2*  CL 104 102 100 103  --  98  CO2 26 22 20* 24  --  25  GLUCOSE 124* 125* 99 50*  52* 94 86  BUN 37* 66* 66* 65*  --  35*  CREATININE 8.60* 12.13* 12.56* 12.87*  --  9.32*  CALCIUM 8.7* 8.5* 8.4* 9.0  --  7.7*    Liver Function Tests: Recent Labs  Lab 12/20/17 1423 12/23/17 2011  AST 21 UNABLE TO RESULT DUE TO HEMOLYSIS  ALT 19 UNABLE TO RESULT DUE TO HEMOLYSIS  ALKPHOS 113 153*  BILITOT 0.4 UNABLE TO RESULT DUE TO HEMOLYSIS  PROT 6.2* 6.3*  ALBUMIN 2.8* 3.0*   No results for input(s):  LIPASE, AMYLASE in the last 168 hours. No results for input(s): AMMONIA in the last 168 hours.  CBC: Recent Labs  Lab 12/20/17 1423 12/23/17 1736 12/24/17 0315  WBC 7.4 6.8 8.0  NEUTROABS 5.5  --   --   HGB 9.7* 11.3* 10.4*  HCT 35.2* 40.9 36.7*  MCV 89.1 88.7 86.6  PLT 285 294 295    Cardiac Enzymes: No results for input(s): CKTOTAL, CKMB, CKMBINDEX, TROPONINI in the last 168 hours.  BNP: Invalid input(s): POCBNP  CBG: Recent Labs  Lab 12/24/17 0644 12/24/17 0648 12/24/17 0653 12/24/17 0740 12/25/17 0740  GLUCAP 44* 41* 41* 92 82    Microbiology: Results for orders placed or performed during the hospital encounter of 11/18/17  MRSA PCR Screening     Status: None   Collection Time: 11/19/17  2:01 AM  Result Value Ref Range Status   MRSA by PCR NEGATIVE NEGATIVE Final    Comment:        The GeneXpert MRSA Assay (FDA approved for NASAL specimens only), is one component of a comprehensive MRSA colonization surveillance program. It is not intended to diagnose MRSA infection nor to guide or monitor treatment for MRSA infections. Performed at [facility], [address] [place], [place] [address]     Coagulation Studies: No results for input(s): LABPROT, INR in the  last 72 hours.  Urinalysis: No results for input(s): COLORURINE, LABSPEC, PHURINE, GLUCOSEU, HGBUR, BILIRUBINUR, KETONESUR, PROTEINUR, UROBILINOGEN, NITRITE, LEUKOCYTESUR in the last 72 hours.  Invalid input(s): APPERANCEUR    Imaging: Dg Chest 2 View  Result Date: 12/23/2017 CLINICAL DATA:  Shortness of breath. EXAM: CHEST - 2 VIEW COMPARISON:  Chest x-ray dated 12/09/2017. FINDINGS: Stable cardiomegaly. Dialysis catheter is stable in position with tip at the level of the mid SVC. Mildly prominent interstitial markings noted bilaterally, slightly more prominent than on previous studies suggesting mild interstitial edema related to CHF. No confluent opacity to suggest pneumonia or  alveolar pulmonary edema. No pleural effusion or pneumothorax seen. IMPRESSION: 1. Mild interstitial edema indicating mild CHF/volume overload. 2. Stable cardiomegaly. Electronically Signed   By: Franki Cabot M.D.   On: 12/23/2017 18:59   US Paracentesis  Result Date: 12/25/2017 INDICATION: Recurrent ascites, renal failure, abdominal distension EXAM: ULTRASOUND GUIDED left PARACENTESIS MEDICATIONS: 1% lidocaine local COMPLICATIONS: None immediate. PROCEDURE: Informed written consent was obtained from the patient after a discussion of the risks, benefits and alternatives to treatment. A timeout was performed prior to the initiation of the procedure. Initial ultrasound scanning demonstrates a large amount of ascites within the left lower abdominal quadrant. The right lower abdomen was prepped and draped in the usual sterile fashion. 1% lidocaine with epinephrine was used for local anesthesia. Following this, a 6 Fr Safe-T-Centesis catheter was introduced. An ultrasound image was saved for documentation purposes. The paracentesis was performed. The catheter was removed and a dressing was applied. The patient tolerated the procedure well without immediate post procedural complication. FINDINGS: A total of approximately 6.75 of clear peritoneal fluid was removed. Sample was not sent for laboratory analysis IMPRESSION: Successful ultrasound-guided paracentesis yielding 6.75 liters of peritoneal fluid. Electronically Signed   By: Jerilynn Mages.  Shick M.D.   On: 12/25/2017 10:58     Medications:   . albumin human     . calcium acetate  2,001 mg Oral TID WC  . Chlorhexidine Gluconate Cloth  6 each Topical Q0600  . docusate sodium  100 mg Oral BID  . ferrous sulfate  325 mg Oral TID  . fluticasone furoate-vilanterol  1 puff Inhalation Daily  . folic acid  1 mg Oral Daily  . heparin  5,000 Units Subcutaneous Q8H  . labetalol  100 mg Oral Daily  . lidocaine  15 mL Mouth/Throat Once  . multivitamin  1 tablet Oral QHS   . nicotine  14 mg Transdermal Daily  . pantoprazole  40 mg Oral Daily  . patiromer  16.8 g Oral Daily     Assessment/ Plan:  Mr. Hooper Petteway is a 57 y.o. black male withend stage renal disease on hemodialysis secondary to Alport's syndrome, ascites, hypertension, anemia of chronic kidney disease, coronary artery disease, peripheral vascular disease, hyperlipidemia, gastrointestinal AVMs, pulmonary hypertension  [facility] TTS EDW 83 kg  1.    Hyperkalemia in the setting of end-stage renal disease -extra hemodialysis today.  Volume removal 3.5-4 5 kg as tolerated -2 K bath.  2.  Acute pulm edema/Volume overload -Fluid removal with hemodialysis as tolerated  3.   Ascites  Patient underwent paracentesis on 10/14.  6.75 L removed   4.  Anemia of chronic kidney disease:   - EPO with HD treatment, as outpatient  hemoglobin 10.4  5.  Secondary hyperparathyroidism - Maintain the patient on calcium acetate.   LOS: 0 Daivion Pape 10/14/201911:03 AM

## 2017-12-25 NOTE — Progress Notes (Signed)
Pre HD assessment   12/25/17 1418  Vital Signs  Temp 98.5 F (36.9 C)  Temp Source Oral  Pulse Rate 83  Pulse Rate Source Monitor  Resp (!) 21  BP 115/65  BP Location Left Arm  BP Method Automatic  Patient Position (if appropriate) Lying  Oxygen Therapy  SpO2 98 %  O2 Device Room Air  Pain Assessment  Pain Scale 0-10  Pain Score 9  Pain Type Chronic pain  Pain Location Foot  Pain Orientation Left  Pain Frequency Constant  Pain Onset On-going  Pain Intervention(s) RN made aware  Dialysis Weight  Weight 80 kg  Type of Weight Pre-Dialysis  Time-Out for Hemodialysis  What Procedure? HD  Pt Identifiers(min of two) First/Last Name;MRN/Account#  Correct Site? Yes  Correct Side? Yes  Correct Procedure? Yes  Consents Verified? Yes  Rad Studies Available? N/A  Safety Precautions Reviewed? Yes  Engineer, civil (consulting) Number  ([id number])  Station Number 1  UF/Alarm Test Passed  Conductivity: Meter 13.8  Conductivity: Machine  14  pH 7.6  Reverse Osmosis main  Normal Saline Lot Number [id number]  Dialyzer Lot Number [id number]  Disposable Set Lot Number [id number]  Machine Temperature 98.6 F (37 C)  Musician and Audible Yes  Blood Lines Intact and Secured Yes  Pre Treatment Patient Checks  Vascular access used during treatment Catheter  Hepatitis B Surface Antigen Results Negative  Date Hepatitis B Surface Antigen Drawn 06/16/17  Hepatitis B Surface Antibody  (>10)  Date Hepatitis B Surface Antibody Drawn 06/16/17  Hemodialysis Consent Verified Yes  Hemodialysis Standing Orders Initiated Yes  ECG (Telemetry) Monitor On Yes  Prime Ordered Normal Saline  Length of  DialysisTreatment -hour(s) 3.5 Hour(s)  Dialyzer Elisio 17H NR  Dialysate 2K, 2.5 Ca  Dialysis Anticoagulant None  Dialysate Flow Ordered 800  Blood Flow Rate Ordered 400 mL/min  Ultrafiltration Goal 3.5 Liters  Dialysis Blood Pressure Support Ordered Normal Saline  Education / Care Plan  Dialysis  Education Provided Yes  Documented Education in Care Plan Yes  Hemodialysis Catheter Right Internal jugular Double-lumen  No Placement Date or Time found.   Placed prior to admission: Yes  Orientation: Right  Access Location: Internal jugular  Hemodialysis Catheter Type: Double-lumen  Site Condition No complications  Blue Lumen Status Heparin locked  Red Lumen Status Heparin locked  Purple Lumen Status N/A  Dressing Type Biopatch  Dressing Status Clean;Dry;Intact  Drainage Description None

## 2017-12-25 NOTE — Progress Notes (Signed)
Post HD assessment    12/25/17 1810  Neurological  Level of Consciousness Alert  Orientation Level Oriented X4  Respiratory  Respiratory Pattern Regular  Chest Assessment Chest expansion symmetrical  Bilateral Breath Sounds Expiratory wheezes  Cardiac  ECG Monitor Yes  Cardiac Rhythm NSR  Vascular  R Radial Pulse +2  L Radial Pulse +2  Edema Generalized  Integumentary  Integumentary (WDL) X  Skin Color Appropriate for ethnicity  Musculoskeletal  Musculoskeletal (WDL) X  Generalized Weakness Yes  Assistive Device None  GU Assessment  Genitourinary (WDL) X  Genitourinary Symptoms  (HD)  Psychosocial  Psychosocial (WDL) WDL  Needs Expressed Physical  Emotional support given Given to patient

## 2017-12-25 NOTE — Progress Notes (Signed)
HD tx start    12/25/17 1426  Vital Signs  Pulse Rate 84  Pulse Rate Source Monitor  Resp (!) 22  BP 116/71  BP Location Left Arm  BP Method Automatic  Patient Position (if appropriate) Lying  Oxygen Therapy  SpO2 94 %  O2 Device Room Air  During Hemodialysis Assessment  Blood Flow Rate (mL/min) 400 mL/min  Arterial Pressure (mmHg) -160 mmHg  Venous Pressure (mmHg) 150 mmHg  Transmembrane Pressure (mmHg) 60 mmHg  Ultrafiltration Rate (mL/min) 1290 mL/min  Dialysate Flow Rate (mL/min) 800 ml/min  Conductivity: Machine  13.9  HD Safety Checks Performed Yes  Dialysis Fluid Bolus Normal Saline  Bolus Amount (mL) 250 mL  Intra-Hemodialysis Comments Tx initiated  Hemodialysis Catheter Right Internal jugular Double-lumen  No Placement Date or Time found.   Placed prior to admission: Yes  Orientation: Right  Access Location: Internal jugular  Hemodialysis Catheter Type: Double-lumen  Blue Lumen Status Infusing  Red Lumen Status Infusing

## 2017-12-26 ENCOUNTER — Inpatient Hospital Stay: Payer: Medicare Other

## 2017-12-26 LAB — CBC
HEMATOCRIT: 34.7 % — AB (ref 39.0–52.0)
HEMOGLOBIN: 9.7 g/dL — AB (ref 13.0–17.0)
MCH: 24.7 pg — ABNORMAL LOW (ref 26.0–34.0)
MCHC: 28 g/dL — AB (ref 30.0–36.0)
MCV: 88.5 fL (ref 80.0–100.0)
Platelets: 226 10*3/uL (ref 150–400)
RBC: 3.92 MIL/uL — ABNORMAL LOW (ref 4.22–5.81)
RDW: 21.9 % — AB (ref 11.5–15.5)
WBC: 5.4 10*3/uL (ref 4.0–10.5)
nRBC: 0 % (ref 0.0–0.2)

## 2017-12-26 LAB — BASIC METABOLIC PANEL
ANION GAP: 9 (ref 5–15)
BUN: 26 mg/dL — ABNORMAL HIGH (ref 6–20)
CALCIUM: 8.2 mg/dL — AB (ref 8.9–10.3)
CHLORIDE: 101 mmol/L (ref 98–111)
CO2: 26 mmol/L (ref 22–32)
CREATININE: 7.18 mg/dL — AB (ref 0.61–1.24)
GFR calc non Af Amer: 7 mL/min — ABNORMAL LOW (ref >60)
GFR, EST AFRICAN AMERICAN: 9 mL/min — AB (ref >60)
GLUCOSE: 171 mg/dL — AB (ref 70–99)
Potassium: 5.4 mmol/L — ABNORMAL HIGH (ref 3.5–5.1)
Sodium: 136 mmol/L (ref 135–145)

## 2017-12-26 LAB — MAGNESIUM: Magnesium: 1.9 mg/dL (ref 1.7–2.4)

## 2017-12-26 LAB — GLUCOSE, CAPILLARY: GLUCOSE-CAPILLARY: 155 mg/dL — AB (ref 70–99)

## 2017-12-26 MED ORDER — NEPRO/CARBSTEADY PO LIQD: 237.0000 mL | Freq: Two times a day (BID) | ORAL | Status: DC

## 2017-12-26 MED ORDER — IPRATROPIUM-ALBUTEROL 0.5-2.5 (3) MG/3ML IN SOLN: 3.0000 mL | Freq: Three times a day (TID) | RESPIRATORY_TRACT | Status: DC

## 2017-12-26 MED ORDER — SODIUM CHLORIDE 0.9 % IV SOLN
1.0000 g | INTRAVENOUS | Status: DC
  Filled 2017-12-26: qty 1

## 2017-12-26 MED ORDER — VANCOMYCIN HCL 10 G IV SOLR
1750.0000 mg | Freq: Once | INTRAVENOUS | Status: DC
  Filled 2017-12-26: qty 1750

## 2017-12-26 MED ORDER — METHYLPREDNISOLONE SODIUM SUCC 40 MG IJ SOLR: 40.0000 mg | Freq: Two times a day (BID) | INTRAMUSCULAR | Status: DC

## 2017-12-26 MED ORDER — DIPHENHYDRAMINE HCL 50 MG/ML IJ SOLN
12.5000 mg | Freq: Once | INTRAMUSCULAR | Status: AC
  Administered 2017-12-26: 12.5 mg via INTRAVENOUS

## 2017-12-26 MED ORDER — VANCOMYCIN HCL IN DEXTROSE 750-5 MG/150ML-% IV SOLN
750.0000 mg | INTRAVENOUS | Status: DC
  Filled 2017-12-26: qty 150

## 2017-12-26 NOTE — Progress Notes (Signed)
Post HD assessment    12/26/17 1511  Neurological  Level of Consciousness Alert  Orientation Level Oriented X4  Respiratory  Respiratory Pattern Regular;Unlabored  Chest Assessment Chest expansion symmetrical  Bilateral Breath Sounds Expiratory wheezes  Cough Non-productive  Cardiac  ECG Monitor Yes  Cardiac Rhythm NSR  Vascular  R Radial Pulse +2  L Radial Pulse +2  Edema Generalized  Integumentary  Integumentary (WDL) X  Skin Color Appropriate for ethnicity  Musculoskeletal  Musculoskeletal (WDL) X  Generalized Weakness Yes  Assistive Device None  GU Assessment  Genitourinary (WDL) X  Genitourinary Symptoms  (HD)  Psychosocial  Psychosocial (WDL) X  Patient Behaviors Agitated;Aggressive verbally

## 2017-12-26 NOTE — Progress Notes (Signed)
[place] NAME: Stanley [name]    MR#:  [id number]  DATE OF BIRTH:  1960/10/04  SUBJECTIVE:  CHIEF COMPLAINT:   Chief Complaint  Patient presents with  . Shortness of Breath  Discharge held for fever 101.2, noted transient asymptomatic ventricular tachycardia, case discussed with nephrology, will check blood cultures, chest x-ray, for echocardiogram to rule out acute carditis, cannot rule out possible infected HD catheter  REVIEW OF SYSTEMS:  CONSTITUTIONAL: No fever, fatigue or weakness.  EYES: No blurred or double vision.  EARS, NOSE, AND THROAT: No tinnitus or ear pain.  RESPIRATORY: No cough, shortness of breath, wheezing or hemoptysis.  CARDIOVASCULAR: No chest pain, orthopnea, edema.  GASTROINTESTINAL: No nausea, vomiting, diarrhea or abdominal pain.  GENITOURINARY: No dysuria, hematuria.  ENDOCRINE: No polyuria, nocturia,  HEMATOLOGY: No anemia, easy bruising or bleeding SKIN: No rash or lesion. MUSCULOSKELETAL: No joint pain or arthritis.   NEUROLOGIC: No tingling, numbness, weakness.  PSYCHIATRY: No anxiety or depression.   ROS  DRUG ALLERGIES:  No Known Allergies  VITALS:  Blood pressure 118/71, pulse 71, temperature 98.3 F (36.8 C), temperature source Oral, resp. rate 18, height 6' (1.829 m), weight 75.7 kg, SpO2 97 %.  PHYSICAL EXAMINATION:  GENERAL:  57 y.o.-year-old patient lying in the bed with no acute distress.  EYES: Pupils equal, round, reactive to light and accommodation. No scleral icterus. Extraocular muscles intact.  HEENT: Head atraumatic, normocephalic. Oropharynx and nasopharynx clear.  NECK:  Supple, no jugular venous distention. No thyroid enlargement, no tenderness.  LUNGS: Normal breath sounds bilaterally, no wheezing, rales,rhonchi or crepitation. No use of accessory muscles of respiration.  CARDIOVASCULAR: S1, S2 normal. No murmurs, rubs, or gallops.  ABDOMEN: Soft, nontender,  nondistended. Bowel sounds present. No organomegaly or mass.  EXTREMITIES: No pedal edema, cyanosis, or clubbing.  NEUROLOGIC: Cranial nerves II through XII are intact. Muscle strength 5/5 in all extremities. Sensation intact. Gait not checked.  PSYCHIATRIC: The patient is alert and oriented x 3.  SKIN: No obvious rash, lesion, or ulcer.   Physical Exam LABORATORY PANEL:   CBC Recent Labs  Lab 12/26/17 0811  WBC 5.4  HGB 9.7*  HCT 34.7*  PLT 226   ------------------------------------------------------------------------------------------------------------------  Chemistries  Recent Labs  Lab 12/23/17 2011  12/26/17 0811  NA 135   < > 136  K UNABLE TO RESULT DUE TO HEMOLYSIS   < > 5.4*  CL 102   < > 101  CO2 22   < > 26  GLUCOSE 125*   < > 171*  BUN 66*   < > 26*  CREATININE 12.13*   < > 7.18*  CALCIUM 8.5*   < > 8.2*  MG  --   --  1.9  AST UNABLE TO RESULT DUE TO HEMOLYSIS  --   --   ALT UNABLE TO RESULT DUE TO HEMOLYSIS  --   --   ALKPHOS 153*  --   --   BILITOT UNABLE TO RESULT DUE TO HEMOLYSIS  --   --    < > = values in this interval not displayed.   ------------------------------------------------------------------------------------------------------------------  Cardiac Enzymes No results for input(s): TROPONINI in the last 168 hours. ------------------------------------------------------------------------------------------------------------------  RADIOLOGY:  Dg Chest 2 View  Result Date: 12/26/2017 CLINICAL DATA:  Fever.  Pulmonary edema. EXAM: CHEST - 2 VIEW COMPARISON:  12/23/2017. FINDINGS: Dual-lumen catheter with tip over the superior vena cava. Prior CABG. Cardiomegaly with pulmonary vascular prominence and bilateral  interstitial prominence suggesting mild CHF. Interim improvement from prior exam. No pleural effusion. No pneumothorax. No acute bony abnormality. IMPRESSION: 1.  Dual-lumen catheter stable position. 2. Prior CABG. Cardiomegaly with  pulmonary venous congestion bilateral interstitial prominence consistent with CHF. Interim improvement from prior exam. Electronically Signed   By: Marcello Moores  Register   On: 12/26/2017 09:07   US Paracentesis  Result Date: 12/25/2017 INDICATION: Recurrent ascites, renal failure, abdominal distension EXAM: ULTRASOUND GUIDED left PARACENTESIS MEDICATIONS: 1% lidocaine local COMPLICATIONS: None immediate. PROCEDURE: Informed written consent was obtained from the patient after a discussion of the risks, benefits and alternatives to treatment. A timeout was performed prior to the initiation of the procedure. Initial ultrasound scanning demonstrates a large amount of ascites within the left lower abdominal quadrant. The right lower abdomen was prepped and draped in the usual sterile fashion. 1% lidocaine with epinephrine was used for local anesthesia. Following this, a 6 Fr Safe-T-Centesis catheter was introduced. An ultrasound image was saved for documentation purposes. The paracentesis was performed. The catheter was removed and a dressing was applied. The patient tolerated the procedure well without immediate post procedural complication. FINDINGS: A total of approximately 6.75 of clear peritoneal fluid was removed. Sample was not sent for laboratory analysis IMPRESSION: Successful ultrasound-guided paracentesis yielding 6.75 liters of peritoneal fluid. Electronically Signed   By: Jerilynn Mages.  Shick M.D.   On: 12/25/2017 10:58    ASSESSMENT AND PLAN:  *Acute pulmonary edema Resolved Due to fluid overload from noncompliance with hemodialysis for end-stage renal disease Patient received hemodialysis on October 13, 14, and will have dialysis later today and discussion with nephrology  *Chronic end-stage renal disease Stable Nephrology consulted for hemodialysis needs  *Acute febrile illness Secondary to unknown etiology Cannot exclude possible infected hemodialysis catheter Chest x-ray noted for edema, check  blood cultures, consult infectious disease for expert opinion, empiric cefepime/vancomycin for now  *Acute transient ventricular tachycardia Resolved Cardiology to see, check echocardiogram  *Acute hyperkalemia Stable Secondary to noncompliance with hemodialysis For repeat hemodialysis later today   *Chronic benign essential hypertension Stable on current regiment  *COPD without exacerbation  Stable  Continue breathing treatments as needed  *Chronic tobacco smoking abuse/dependency  Nicotine patch and cessation counseling ordered   Long-term prognosis is poor due to noncompliance with medical management/hemodialysis  All the records are reviewed and case discussed with Care Management/Social Workerr. Management plans discussed with the patient, family and they are in agreement.  CODE STATUS: 40  TOTAL TIME TAKING CARE OF THIS PATIENT: 40 minutes.     POSSIBLE D/C IN 2-3 DAYS, DEPENDING ON CLINICAL CONDITION.   Avel Peace Ariyanah Aguado M.D on 12/26/2017   Between 7am to 6pm - Pager - [phone]  After 6pm go to [email or web address] - password [email or web address] [facility]  Office  [phone]  CC: Primary care physician; [name], MD  Note: This dictation was prepared with Dragon dictation along with smaller phrase technology. Any transcriptional errors that result from this process are unintentional.

## 2017-12-26 NOTE — Progress Notes (Signed)
Pre HD assessment   12/26/17 1228  Vital Signs  Temp 98 F (36.7 C)  Temp Source Oral  Pulse Rate 86  Pulse Rate Source Monitor  Resp 17  BP 136/83  BP Location Left Arm  BP Method Automatic  Patient Position (if appropriate) Lying  Oxygen Therapy  SpO2 98 %  O2 Device Room Air  Pain Assessment  Pain Scale 0-10  Pain Score 9  Pain Type Chronic pain  Pain Location Foot  Pain Orientation Left  Pain Descriptors / Indicators Aching  Pain Frequency Constant  Pain Onset On-going  Pain Intervention(s) RN made aware  Dialysis Weight  Weight 77.1 kg  Type of Weight Pre-Dialysis  Time-Out for Hemodialysis  What Procedure? HD  Pt Identifiers(min of two) First/Last Name;MRN/Account#  Correct Site? Yes  Correct Side? Yes  Correct Procedure? Yes  Consents Verified? Yes  Rad Studies Available? N/A  Safety Precautions Reviewed? Yes  Engineer, civil (consulting) Number  ([id number])  Station Number 4  UF/Alarm Test Passed  Conductivity: Meter 13.4  Conductivity: Machine  13.6  pH 7.6  Reverse Osmosis main  Normal Saline Lot Number [id number]  Dialyzer Lot Number [id number]  Disposable Set Lot Number [id number]  Machine Temperature 98.6 F (37 C)  Musician and Audible Yes  Blood Lines Intact and Secured Yes  Pre Treatment Patient Checks  Vascular access used during treatment Catheter  Hepatitis B Surface Antigen Results Negative  Date Hepatitis B Surface Antigen Drawn 06/16/17  Hepatitis B Surface Antibody 50.3 (>10)  Date Hepatitis B Surface Antibody Drawn 06/16/17  Hemodialysis Consent Verified Yes  Hemodialysis Standing Orders Initiated Yes  ECG (Telemetry) Monitor On Yes  Prime Ordered Normal Saline  Length of  DialysisTreatment -hour(s) 3 Hour(s)  Dialyzer Elisio 17H NR  Dialysate 2K, 2.5 Ca  Dialysis Anticoagulant None  Dialysate Flow Ordered 800  Blood Flow Rate Ordered 400 mL/min  Ultrafiltration Goal 1.5 Liters  Dialysis Blood Pressure Support Ordered Normal Saline   Education / Care Plan  Dialysis Education Provided Yes  Documented Education in Care Plan Yes  Hemodialysis Catheter Right Internal jugular Double-lumen  No Placement Date or Time found.   Placed prior to admission: Yes  Orientation: Right  Access Location: Internal jugular  Hemodialysis Catheter Type: Double-lumen  Site Condition No complications  Blue Lumen Status Heparin locked  Red Lumen Status Heparin locked  Purple Lumen Status N/A  Dressing Type Biopatch  Dressing Status Clean;Dry;Intact  Drainage Description None

## 2017-12-26 NOTE — Consult Note (Signed)
Pharmacy Antibiotic Note  Markavious Micco is a 57 y.o. male admitted on 12/23/2017 with fever in HD pt.  Pharmacy has been consulted for Vancomycin dosing.  Plan: Initial dose of 1750mg  based on wt in HD patient.  To follow with 750mg  after Thursday, Saturday, and Tuesday Dialyses  Height: 6' (182.9 cm) Weight: 167 lb 15.9 oz (76.2 kg) IBW/kg (Calculated) : 77.6  Temp (24hrs), Avg:98.9 F (37.2 C), Min:98 F (36.7 C), Max:101.2 F (38.4 C)  Recent Labs  Lab 12/20/17 1423 12/23/17 1736 12/23/17 2011 12/24/17 0315 12/24/17 0632 12/25/17 0816 12/26/17 0811  WBC 7.4 6.8  --  8.0  --   --  5.4  CREATININE 8.60*  --  12.13* 12.56* 12.87* 9.32* 7.18*    Estimated Creatinine Clearance: 12.1 mL/min (A) (by C-G formula based on SCr of 7.18 mg/dL (H)).    No Known Allergies  Antimicrobials this admission:  Microbiology results: 10/15 BCx: awaiting results    Thank you for allowing pharmacy to be a part of this patient's care.  Lu Duffel, PharmD Clinical Pharmacist 12/26/2017 3:30 PM

## 2017-12-26 NOTE — Progress Notes (Signed)
HD tx start    12/26/17 1235  Vital Signs  Pulse Rate 85  Pulse Rate Source Monitor  Resp 18  BP 129/79  BP Location Left Arm  BP Method Automatic  Patient Position (if appropriate) Lying  Oxygen Therapy  SpO2 97 %  O2 Device Room Air  During Hemodialysis Assessment  Blood Flow Rate (mL/min) 400 mL/min  Arterial Pressure (mmHg) -140 mmHg  Venous Pressure (mmHg) 140 mmHg  Transmembrane Pressure (mmHg) 50 mmHg  Ultrafiltration Rate (mL/min) 830 mL/min  Dialysate Flow Rate (mL/min) 800 ml/min  Conductivity: Machine  13.7  HD Safety Checks Performed Yes  Dialysis Fluid Bolus Normal Saline  Bolus Amount (mL) 250 mL  Intra-Hemodialysis Comments Tx initiated  Hemodialysis Catheter Right Internal jugular Double-lumen  No Placement Date or Time found.   Placed prior to admission: Yes  Orientation: Right  Access Location: Internal jugular  Hemodialysis Catheter Type: Double-lumen  Blue Lumen Status Infusing  Red Lumen Status Infusing

## 2017-12-26 NOTE — Progress Notes (Signed)
HD tx end    12/26/17 1509  Vital Signs  Pulse Rate 93  Pulse Rate Source Monitor  Resp 14  BP (!) 153/79  BP Location Left Arm  BP Method Automatic  Patient Position (if appropriate) Lying  Oxygen Therapy  SpO2 94 %  O2 Device Room Air  During Hemodialysis Assessment  Dialysis Fluid Bolus Normal Saline  Bolus Amount (mL) 250 mL  Intra-Hemodialysis Comments Tx completed

## 2017-12-26 NOTE — Progress Notes (Signed)
Post HD assessment. Pt tolerated tx well without c/o or complication. Pt requested to be taken off of tx early, 30 minutes remaining in HD tx, MD aware. Net UF 1579, goal met.    12/26/17 1512  Hand-Off documentation  Report given to (Full Name) Harless Nakayama  Report received from (Full Name) Stark Bray  Vital Signs  Temp 98.3 F (36.8 C)  Temp Source Oral  Pulse Rate 94  Pulse Rate Source Monitor  Resp 16  BP (!) 146/76  BP Location Left Arm  BP Method Automatic  Patient Position (if appropriate) Lying  Oxygen Therapy  SpO2 95 %  O2 Device Room Air  Dialysis Weight  Weight 76.2 kg  Type of Weight Post-Dialysis  Post-Hemodialysis Assessment  Rinseback Volume (mL) 250 mL  KECN 55.4 V  Dialyzer Clearance Lightly streaked  Duration of HD Treatment -hour(s) 2.5 hour(s)  Hemodialysis Intake (mL) 500 mL  UF Total -Machine (mL) 2079 mL  Net UF (mL) 1579 mL  Tolerated HD Treatment Yes  Education / Care Plan  Dialysis Education Provided Yes  Documented Education in Care Plan Yes  Hemodialysis Catheter Right Internal jugular Double-lumen  No Placement Date or Time found.   Placed prior to admission: Yes  Orientation: Right  Access Location: Internal jugular  Hemodialysis Catheter Type: Double-lumen  Site Condition No complications  Blue Lumen Status Heparin locked  Red Lumen Status Heparin locked  Purple Lumen Status N/A  Catheter fill solution Heparin 1000 units/ml  Catheter fill volume (Arterial) 1.5 cc  Catheter fill volume (Venous) 1.5  Dressing Type Biopatch  Dressing Status Clean;Dry;Intact  Drainage Description None  Post treatment catheter status Capped and Clamped

## 2017-12-26 NOTE — Progress Notes (Signed)
Family Meeting Note  Advance Directive:yes  Today a meeting took place with the Patient.  Patient is able to participate  The following clinical team members were present during this meeting:MD  The following were discussed:Patient's diagnosis: ESRD, Patient's progosis: Unable to determine and Goals for treatment: Full Code  Additional follow-up to be provided: prn  Time spent during discussion:20 minutes  Gorden Harms, MD

## 2017-12-26 NOTE — Progress Notes (Signed)
[facility]  ROUNDING NOTE   Subjective:   More than 7 L of fluid has been removed with hemodialysis.  Another almost 7 L was removed with paracentesis.  Patient continues to have cough and wheezing.  Temperature of 101.2 last night  Objective:  Vital signs in last 24 hours:  Temp:  [98.2 F (36.8 C)-101.2 F (38.4 C)] 98.3 F (36.8 C) (10/15 0438) Pulse Rate:  [71-93] 71 (10/15 0438) Resp:  [15-22] 18 (10/15 0438) BP: (107-138)/(59-89) 118/71 (10/15 0438) SpO2:  [90 %-98 %] 97 % (10/15 0713) Weight:  [75.7 kg-80 kg] 75.7 kg (10/15 0438)  Weight change:  Filed Weights   12/25/17 1418 12/25/17 1811 12/26/17 0438  Weight: 80 kg 79.1 kg 75.7 kg    Intake/Output: I/O last 3 completed shifts: In: 0  Out: 3829 [Urine:250; Other:3579]   Intake/Output this shift:  Total I/O In: 360 [P.O.:360] Out: -   Physical Exam: General: NAD, laying in bed  Head: Normocephalic, atraumatic. Moist oral mucosal membranes  Eyes: Anicteric,   Neck: Supple, trachea midline  Lungs:  Bilateral wheezing, room air  Heart: S1S2 no rubs  Abdomen:  Soft, significantly distended, BS present  Extremities:  Trace peripheral edema.  Neurologic: Awake, alert, follows commands  Skin: No lesions  Access: RIJ permcath    Basic Metabolic Panel: Recent Labs  Lab 12/23/17 2011 12/24/17 0315 12/24/17 8563 12/24/17 0840 12/25/17 0816 12/26/17 0811  NA 135 136 140  --  137 136  K UNABLE TO RESULT DUE TO HEMOLYSIS 7.4* 6.7*  --  5.2* 5.4*  CL 102 100 103  --  98 101  CO2 22 20* 24  --  25 26  GLUCOSE 125* 99 50*  52* 94 86 171*  BUN 66* 66* 65*  --  35* 26*  CREATININE 12.13* 12.56* 12.87*  --  9.32* 7.18*  CALCIUM 8.5* 8.4* 9.0  --  7.7* 8.2*  MG  --   --   --   --   --  1.9    Liver Function Tests: Recent Labs  Lab 12/20/17 1423 12/23/17 2011  AST 21 UNABLE TO RESULT DUE TO HEMOLYSIS  ALT 19 UNABLE TO RESULT DUE TO HEMOLYSIS  ALKPHOS 113 153*  BILITOT 0.4 UNABLE TO RESULT  DUE TO HEMOLYSIS  PROT 6.2* 6.3*  ALBUMIN 2.8* 3.0*   No results for input(s): LIPASE, AMYLASE in the last 168 hours. No results for input(s): AMMONIA in the last 168 hours.  CBC: Recent Labs  Lab 12/20/17 1423 12/23/17 1736 12/24/17 0315 12/26/17 0811  WBC 7.4 6.8 8.0 5.4  NEUTROABS 5.5  --   --   --   HGB 9.7* 11.3* 10.4* 9.7*  HCT 35.2* 40.9 36.7* 34.7*  MCV 89.1 88.7 86.6 88.5  PLT 285 294 295 226    Cardiac Enzymes: No results for input(s): CKTOTAL, CKMB, CKMBINDEX, TROPONINI in the last 168 hours.  BNP: Invalid input(s): POCBNP  CBG: Recent Labs  Lab 12/24/17 0648 12/24/17 0653 12/24/17 0740 12/25/17 0740 12/26/17 0759  GLUCAP 41* 41* 92 63 155*    Microbiology: Results for orders placed or performed during the hospital encounter of 11/18/17  MRSA PCR Screening     Status: None   Collection Time: 11/19/17  2:01 AM  Result Value Ref Range Status   MRSA by PCR NEGATIVE NEGATIVE Final    Comment:        The GeneXpert MRSA Assay (FDA approved for NASAL specimens only), is one component of  a comprehensive MRSA colonization surveillance program. It is not intended to diagnose MRSA infection nor to guide or monitor treatment for MRSA infections. Performed at [facility], [address] [place], [place] [address]     Coagulation Studies: No results for input(s): LABPROT, INR in the last 72 hours.  Urinalysis: No results for input(s): COLORURINE, LABSPEC, PHURINE, GLUCOSEU, HGBUR, BILIRUBINUR, KETONESUR, PROTEINUR, UROBILINOGEN, NITRITE, LEUKOCYTESUR in the last 72 hours.  Invalid input(s): APPERANCEUR    Imaging: Dg Chest 2 View  Result Date: 12/26/2017 CLINICAL DATA:  Fever.  Pulmonary edema. EXAM: CHEST - 2 VIEW COMPARISON:  12/23/2017. FINDINGS: Dual-lumen catheter with tip over the superior vena cava. Prior CABG. Cardiomegaly with pulmonary vascular prominence and bilateral interstitial prominence suggesting mild CHF. Interim  improvement from prior exam. No pleural effusion. No pneumothorax. No acute bony abnormality. IMPRESSION: 1.  Dual-lumen catheter stable position. 2. Prior CABG. Cardiomegaly with pulmonary venous congestion bilateral interstitial prominence consistent with CHF. Interim improvement from prior exam. Electronically Signed   By: Marcello Moores  Register   On: 12/26/2017 09:07   US Paracentesis  Result Date: 12/25/2017 INDICATION: Recurrent ascites, renal failure, abdominal distension EXAM: ULTRASOUND GUIDED left PARACENTESIS MEDICATIONS: 1% lidocaine local COMPLICATIONS: None immediate. PROCEDURE: Informed written consent was obtained from the patient after a discussion of the risks, benefits and alternatives to treatment. A timeout was performed prior to the initiation of the procedure. Initial ultrasound scanning demonstrates a large amount of ascites within the left lower abdominal quadrant. The right lower abdomen was prepped and draped in the usual sterile fashion. 1% lidocaine with epinephrine was used for local anesthesia. Following this, a 6 Fr Safe-T-Centesis catheter was introduced. An ultrasound image was saved for documentation purposes. The paracentesis was performed. The catheter was removed and a dressing was applied. The patient tolerated the procedure well without immediate post procedural complication. FINDINGS: A total of approximately 6.75 of clear peritoneal fluid was removed. Sample was not sent for laboratory analysis IMPRESSION: Successful ultrasound-guided paracentesis yielding 6.75 liters of peritoneal fluid. Electronically Signed   By: Jerilynn Mages.  Shick M.D.   On: 12/25/2017 10:58     Medications:    . budesonide (PULMICORT) nebulizer solution  0.25 mg Nebulization BID  . calcium acetate  2,001 mg Oral TID WC  . Chlorhexidine Gluconate Cloth  6 each Topical Q0600  . docusate sodium  100 mg Oral BID  . ferrous sulfate  325 mg Oral TID  . folic acid  1 mg Oral Daily  . heparin  5,000 Units  Subcutaneous Q8H  . ipratropium-albuterol  3 mL Nebulization TID  . labetalol  100 mg Oral Daily  . lidocaine  15 mL Mouth/Throat Once  . methylPREDNISolone (SOLU-MEDROL) injection  60 mg Intravenous Q6H  . multivitamin  1 tablet Oral QHS  . nicotine  14 mg Transdermal Daily  . pantoprazole  40 mg Oral Daily  . patiromer  16.8 g Oral Daily     Assessment/ Plan:  [name] is a 57 y.o. black male withend stage renal disease on hemodialysis secondary to Alport's syndrome, ascites, hypertension, anemia of chronic kidney disease, coronary artery disease, peripheral vascular disease, hyperlipidemia, gastrointestinal AVMs, pulmonary hypertension  [facility] EDW ? kg  1.    Hyperkalemia in the setting of end-stage renal disease -Hemodialysis today to get him back on schedule -Ultrafiltration goal 1 to 2 kg as tolerated -More than 7 L of fluid has been removed with hemodialysis so far  2.  Acute pulm edema/Volume overload -Fluid removal with hemodialysis as tolerated ?  COPD exacerbation  3.    Fever. Bronchitis versus PermCath infection Blood cultures are pending WBC count not elevated  4.  Anemia of chronic kidney disease:   - EPO with HD treatment, as outpatient  hemoglobin 10.4  5.  Secondary hyperparathyroidism - Maintain the patient on calcium acetate.  6.  Ascites  Patient underwent paracentesis on 10/14.  6.75 L removed    LOS: 1 Kemon Devincenzi 10/15/201911:10 AM

## 2017-12-26 NOTE — Progress Notes (Signed)
Pre HD assessment    12/26/17 1229  Neurological  Level of Consciousness Alert  Orientation Level Oriented X4  Respiratory  Respiratory Pattern Regular;Unlabored  Bilateral Breath Sounds Expiratory wheezes  Cardiac  ECG Monitor Yes  Cardiac Rhythm NSR  Vascular  R Radial Pulse +2  L Radial Pulse +2  Edema Generalized  Integumentary  Integumentary (WDL) X  Skin Color Appropriate for ethnicity  Musculoskeletal  Musculoskeletal (WDL) X  Generalized Weakness Yes  Assistive Device None  GU Assessment  Genitourinary (WDL) X  Genitourinary Symptoms  (HD)  Psychosocial  Psychosocial (WDL) WDL

## 2017-12-26 NOTE — Progress Notes (Signed)
Patient agitated and cussing upon returning to the floor. Upset that he was not taken off the dialysis machine sooner. Stated his ride was here and he needed to go. Informed the patient that he had not been cleared for discharge and that he still had a pending cardiology consult. Patient said "take my IV out and bring me my papers". Informed him again that there were no discharge papers but if he wanted to leave against medical advice then I would bring the form in. Removed IV, witnessed patient signing AMA form. Patient gathered his belongings and walked to the exit to meet his ride. Informed Dr Jerelyn Charles of patients decision.

## 2017-12-28 DIAGNOSIS — N2581 Secondary hyperparathyroidism of renal origin: Secondary | ICD-10-CM | POA: Diagnosis not present

## 2017-12-28 DIAGNOSIS — N186 End stage renal disease: Secondary | ICD-10-CM | POA: Diagnosis not present

## 2017-12-28 DIAGNOSIS — D509 Iron deficiency anemia, unspecified: Secondary | ICD-10-CM | POA: Diagnosis not present

## 2017-12-28 DIAGNOSIS — D631 Anemia in chronic kidney disease: Secondary | ICD-10-CM | POA: Diagnosis not present

## 2017-12-28 DIAGNOSIS — Z23 Encounter for immunization: Secondary | ICD-10-CM | POA: Diagnosis not present

## 2017-12-28 DIAGNOSIS — Z992 Dependence on renal dialysis: Secondary | ICD-10-CM | POA: Diagnosis not present

## 2017-12-31 LAB — CULTURE, BLOOD (ROUTINE X 2)
CULTURE: NO GROWTH
CULTURE: NO GROWTH
Special Requests: ADEQUATE
Special Requests: ADEQUATE

## 2018-01-02 DIAGNOSIS — N2581 Secondary hyperparathyroidism of renal origin: Secondary | ICD-10-CM | POA: Diagnosis not present

## 2018-01-02 DIAGNOSIS — N186 End stage renal disease: Secondary | ICD-10-CM | POA: Diagnosis not present

## 2018-01-02 DIAGNOSIS — D509 Iron deficiency anemia, unspecified: Secondary | ICD-10-CM | POA: Diagnosis not present

## 2018-01-02 DIAGNOSIS — Z992 Dependence on renal dialysis: Secondary | ICD-10-CM | POA: Diagnosis not present

## 2018-01-02 DIAGNOSIS — Z23 Encounter for immunization: Secondary | ICD-10-CM | POA: Diagnosis not present

## 2018-01-02 DIAGNOSIS — D631 Anemia in chronic kidney disease: Secondary | ICD-10-CM | POA: Diagnosis not present

## 2018-01-03 ENCOUNTER — Other Ambulatory Visit
Admission: RE | Admit: 2018-01-03 | Discharge: 2018-01-03 | Disposition: A | Discharge status: Home / Self Care | Payer: Medicare Other | Source: Ambulatory Visit | Attending: Nephrology | Admitting: Nephrology

## 2018-01-03 DIAGNOSIS — N186 End stage renal disease: Secondary | ICD-10-CM | POA: Diagnosis present

## 2018-01-03 LAB — PHOSPHORUS: Phosphorus: 5.3 mg/dL — ABNORMAL HIGH (ref 2.5–4.6)

## 2018-01-04 DIAGNOSIS — N186 End stage renal disease: Secondary | ICD-10-CM | POA: Diagnosis not present

## 2018-01-04 DIAGNOSIS — D631 Anemia in chronic kidney disease: Secondary | ICD-10-CM | POA: Diagnosis not present

## 2018-01-04 DIAGNOSIS — N2581 Secondary hyperparathyroidism of renal origin: Secondary | ICD-10-CM | POA: Diagnosis not present

## 2018-01-04 DIAGNOSIS — Z992 Dependence on renal dialysis: Secondary | ICD-10-CM | POA: Diagnosis not present

## 2018-01-04 DIAGNOSIS — D509 Iron deficiency anemia, unspecified: Secondary | ICD-10-CM | POA: Diagnosis not present

## 2018-01-04 DIAGNOSIS — Z23 Encounter for immunization: Secondary | ICD-10-CM | POA: Diagnosis not present

## 2018-01-06 DIAGNOSIS — Z992 Dependence on renal dialysis: Secondary | ICD-10-CM | POA: Diagnosis not present

## 2018-01-06 DIAGNOSIS — D631 Anemia in chronic kidney disease: Secondary | ICD-10-CM | POA: Diagnosis not present

## 2018-01-06 DIAGNOSIS — N186 End stage renal disease: Secondary | ICD-10-CM | POA: Diagnosis not present

## 2018-01-06 DIAGNOSIS — D509 Iron deficiency anemia, unspecified: Secondary | ICD-10-CM | POA: Diagnosis not present

## 2018-01-06 DIAGNOSIS — Z23 Encounter for immunization: Secondary | ICD-10-CM | POA: Diagnosis not present

## 2018-01-06 DIAGNOSIS — N2581 Secondary hyperparathyroidism of renal origin: Secondary | ICD-10-CM | POA: Diagnosis not present

## 2018-01-09 DIAGNOSIS — Z23 Encounter for immunization: Secondary | ICD-10-CM | POA: Diagnosis not present

## 2018-01-09 DIAGNOSIS — Z992 Dependence on renal dialysis: Secondary | ICD-10-CM | POA: Diagnosis not present

## 2018-01-09 DIAGNOSIS — D631 Anemia in chronic kidney disease: Secondary | ICD-10-CM | POA: Diagnosis not present

## 2018-01-09 DIAGNOSIS — D509 Iron deficiency anemia, unspecified: Secondary | ICD-10-CM | POA: Diagnosis not present

## 2018-01-09 DIAGNOSIS — N2581 Secondary hyperparathyroidism of renal origin: Secondary | ICD-10-CM | POA: Diagnosis not present

## 2018-01-09 DIAGNOSIS — N186 End stage renal disease: Secondary | ICD-10-CM | POA: Diagnosis not present

## 2018-01-11 DIAGNOSIS — N186 End stage renal disease: Secondary | ICD-10-CM | POA: Diagnosis not present

## 2018-01-11 DIAGNOSIS — Z992 Dependence on renal dialysis: Secondary | ICD-10-CM | POA: Diagnosis not present

## 2018-01-13 DIAGNOSIS — N2581 Secondary hyperparathyroidism of renal origin: Secondary | ICD-10-CM | POA: Diagnosis not present

## 2018-01-13 DIAGNOSIS — D509 Iron deficiency anemia, unspecified: Secondary | ICD-10-CM | POA: Diagnosis not present

## 2018-01-13 DIAGNOSIS — N186 End stage renal disease: Secondary | ICD-10-CM | POA: Diagnosis not present

## 2018-01-13 DIAGNOSIS — Z992 Dependence on renal dialysis: Secondary | ICD-10-CM | POA: Diagnosis not present

## 2018-01-13 DIAGNOSIS — D631 Anemia in chronic kidney disease: Secondary | ICD-10-CM | POA: Diagnosis not present

## 2018-01-15 ENCOUNTER — Other Ambulatory Visit: Payer: Self-pay

## 2018-01-15 ENCOUNTER — Inpatient Hospital Stay: Payer: Medicare Other

## 2018-01-15 ENCOUNTER — Emergency Department: Payer: Medicare Other

## 2018-01-15 ENCOUNTER — Inpatient Hospital Stay
Admission: EM | Admit: 2018-01-15 | Discharge: 2018-01-19 | DRG: 871 | Disposition: A | Discharge status: Home / Self Care | Payer: Medicare Other | Attending: Internal Medicine | Admitting: Internal Medicine

## 2018-01-15 DIAGNOSIS — K219 Gastro-esophageal reflux disease without esophagitis: Secondary | ICD-10-CM | POA: Diagnosis present

## 2018-01-15 DIAGNOSIS — Z716 Tobacco abuse counseling: Secondary | ICD-10-CM

## 2018-01-15 DIAGNOSIS — E875 Hyperkalemia: Secondary | ICD-10-CM | POA: Diagnosis present

## 2018-01-15 DIAGNOSIS — J440 Chronic obstructive pulmonary disease with acute lower respiratory infection: Secondary | ICD-10-CM | POA: Diagnosis present

## 2018-01-15 DIAGNOSIS — Q8781 Alport syndrome: Secondary | ICD-10-CM | POA: Diagnosis not present

## 2018-01-15 DIAGNOSIS — Z9115 Patient's noncompliance with renal dialysis: Secondary | ICD-10-CM

## 2018-01-15 DIAGNOSIS — Z841 Family history of disorders of kidney and ureter: Secondary | ICD-10-CM | POA: Diagnosis not present

## 2018-01-15 DIAGNOSIS — K7460 Unspecified cirrhosis of liver: Secondary | ICD-10-CM | POA: Diagnosis present

## 2018-01-15 DIAGNOSIS — Z1389 Encounter for screening for other disorder: Secondary | ICD-10-CM

## 2018-01-15 DIAGNOSIS — J9601 Acute respiratory failure with hypoxia: Secondary | ICD-10-CM | POA: Diagnosis not present

## 2018-01-15 DIAGNOSIS — D631 Anemia in chronic kidney disease: Secondary | ICD-10-CM | POA: Diagnosis present

## 2018-01-15 DIAGNOSIS — R188 Other ascites: Secondary | ICD-10-CM | POA: Diagnosis present

## 2018-01-15 DIAGNOSIS — I132 Hypertensive heart and chronic kidney disease with heart failure and with stage 5 chronic kidney disease, or end stage renal disease: Secondary | ICD-10-CM | POA: Diagnosis present

## 2018-01-15 DIAGNOSIS — J9621 Acute and chronic respiratory failure with hypoxia: Secondary | ICD-10-CM | POA: Diagnosis present

## 2018-01-15 DIAGNOSIS — R29704 NIHSS score 4: Secondary | ICD-10-CM | POA: Diagnosis not present

## 2018-01-15 DIAGNOSIS — Z951 Presence of aortocoronary bypass graft: Secondary | ICD-10-CM

## 2018-01-15 DIAGNOSIS — R9401 Abnormal electroencephalogram [EEG]: Secondary | ICD-10-CM | POA: Diagnosis present

## 2018-01-15 DIAGNOSIS — N186 End stage renal disease: Secondary | ICD-10-CM | POA: Diagnosis present

## 2018-01-15 DIAGNOSIS — R0602 Shortness of breath: Secondary | ICD-10-CM | POA: Diagnosis not present

## 2018-01-15 DIAGNOSIS — R4182 Altered mental status, unspecified: Secondary | ICD-10-CM | POA: Diagnosis not present

## 2018-01-15 DIAGNOSIS — I2510 Atherosclerotic heart disease of native coronary artery without angina pectoris: Secondary | ICD-10-CM | POA: Diagnosis present

## 2018-01-15 DIAGNOSIS — I5033 Acute on chronic diastolic (congestive) heart failure: Secondary | ICD-10-CM | POA: Diagnosis present

## 2018-01-15 DIAGNOSIS — J810 Acute pulmonary edema: Secondary | ICD-10-CM

## 2018-01-15 DIAGNOSIS — R200 Anesthesia of skin: Secondary | ICD-10-CM | POA: Diagnosis not present

## 2018-01-15 DIAGNOSIS — J984 Other disorders of lung: Secondary | ICD-10-CM | POA: Diagnosis not present

## 2018-01-15 DIAGNOSIS — J189 Pneumonia, unspecified organism: Secondary | ICD-10-CM

## 2018-01-15 DIAGNOSIS — K7031 Alcoholic cirrhosis of liver with ascites: Secondary | ICD-10-CM

## 2018-01-15 DIAGNOSIS — Z800 Family history of malignant neoplasm of digestive organs: Secondary | ICD-10-CM

## 2018-01-15 DIAGNOSIS — E785 Hyperlipidemia, unspecified: Secondary | ICD-10-CM | POA: Diagnosis present

## 2018-01-15 DIAGNOSIS — I739 Peripheral vascular disease, unspecified: Secondary | ICD-10-CM | POA: Diagnosis present

## 2018-01-15 DIAGNOSIS — J9620 Acute and chronic respiratory failure, unspecified whether with hypoxia or hypercapnia: Secondary | ICD-10-CM | POA: Diagnosis present

## 2018-01-15 DIAGNOSIS — Z7951 Long term (current) use of inhaled steroids: Secondary | ICD-10-CM

## 2018-01-15 DIAGNOSIS — Y95 Nosocomial condition: Secondary | ICD-10-CM | POA: Diagnosis present

## 2018-01-15 DIAGNOSIS — A419 Sepsis, unspecified organism: Secondary | ICD-10-CM | POA: Diagnosis not present

## 2018-01-15 DIAGNOSIS — Z992 Dependence on renal dialysis: Secondary | ICD-10-CM | POA: Diagnosis not present

## 2018-01-15 DIAGNOSIS — I272 Pulmonary hypertension, unspecified: Secondary | ICD-10-CM | POA: Diagnosis present

## 2018-01-15 DIAGNOSIS — E8770 Fluid overload, unspecified: Secondary | ICD-10-CM | POA: Diagnosis not present

## 2018-01-15 DIAGNOSIS — R140 Abdominal distension (gaseous): Secondary | ICD-10-CM

## 2018-01-15 DIAGNOSIS — Z77018 Contact with and (suspected) exposure to other hazardous metals: Secondary | ICD-10-CM | POA: Diagnosis not present

## 2018-01-15 DIAGNOSIS — Z79899 Other long term (current) drug therapy: Secondary | ICD-10-CM

## 2018-01-15 DIAGNOSIS — F17210 Nicotine dependence, cigarettes, uncomplicated: Secondary | ICD-10-CM | POA: Diagnosis present

## 2018-01-15 DIAGNOSIS — J441 Chronic obstructive pulmonary disease with (acute) exacerbation: Secondary | ICD-10-CM | POA: Diagnosis not present

## 2018-01-15 DIAGNOSIS — R531 Weakness: Secondary | ICD-10-CM | POA: Diagnosis not present

## 2018-01-15 LAB — BASIC METABOLIC PANEL
ANION GAP: 10 (ref 5–15)
Anion gap: 14 (ref 5–15)
BUN: 57 mg/dL — ABNORMAL HIGH (ref 6–20)
BUN: 28 mg/dL — ABNORMAL HIGH (ref 6–20)
CALCIUM: 8.4 mg/dL — AB (ref 8.9–10.3)
CHLORIDE: 102 mmol/L (ref 98–111)
CHLORIDE: 101 mmol/L (ref 98–111)
CO2: 25 mmol/L (ref 22–32)
CO2: 28 mmol/L (ref 22–32)
Calcium: 8.3 mg/dL — ABNORMAL LOW (ref 8.9–10.3)
Creatinine, Ser: 9.81 mg/dL — ABNORMAL HIGH (ref 0.61–1.24)
Creatinine, Ser: 6.02 mg/dL — ABNORMAL HIGH (ref 0.61–1.24)
GFR calc Af Amer: 6 mL/min — ABNORMAL LOW (ref >60)
GFR calc Af Amer: 11 mL/min — ABNORMAL LOW (ref >60)
GFR calc non Af Amer: 5 mL/min — ABNORMAL LOW (ref >60)
GFR calc non Af Amer: 9 mL/min — ABNORMAL LOW (ref >60)
GLUCOSE: 102 mg/dL — AB (ref 70–99)
GLUCOSE: 203 mg/dL — AB (ref 70–99)
Potassium: 6.1 mmol/L — ABNORMAL HIGH (ref 3.5–5.1)
Potassium: 4.7 mmol/L (ref 3.5–5.1)
Sodium: 141 mmol/L (ref 135–145)
Sodium: 139 mmol/L (ref 135–145)

## 2018-01-15 LAB — LACTIC ACID, PLASMA
LACTIC ACID, VENOUS: 0.8 mmol/L (ref 0.5–1.9)
Lactic Acid, Venous: 1.8 mmol/L (ref 0.5–1.9)

## 2018-01-15 LAB — CBC WITH DIFFERENTIAL/PLATELET
ABS IMMATURE GRANULOCYTES: 0.13 10*3/uL — AB (ref 0.00–0.07)
BASOS ABS: 0.1 10*3/uL (ref 0.0–0.1)
Basophils Relative: 0 %
Eosinophils Absolute: 0 10*3/uL (ref 0.0–0.5)
Eosinophils Relative: 0 %
HEMATOCRIT: 35.7 % — AB (ref 39.0–52.0)
HEMOGLOBIN: 10 g/dL — AB (ref 13.0–17.0)
IMMATURE GRANULOCYTES: 1 %
LYMPHS PCT: 4 %
Lymphs Abs: 0.7 10*3/uL (ref 0.7–4.0)
MCH: 24.6 pg — ABNORMAL LOW (ref 26.0–34.0)
MCHC: 28 g/dL — ABNORMAL LOW (ref 30.0–36.0)
MCV: 87.7 fL (ref 80.0–100.0)
MONOS PCT: 9 %
Monocytes Absolute: 1.5 10*3/uL — ABNORMAL HIGH (ref 0.1–1.0)
NEUTROS ABS: 14 10*3/uL — AB (ref 1.7–7.7)
NEUTROS PCT: 86 %
Platelets: 346 10*3/uL (ref 150–400)
RBC: 4.07 MIL/uL — ABNORMAL LOW (ref 4.22–5.81)
RDW: 19.3 % — AB (ref 11.5–15.5)
WBC: 16.4 10*3/uL — ABNORMAL HIGH (ref 4.0–10.5)
nRBC: 0 % (ref 0.0–0.2)

## 2018-01-15 LAB — BLOOD GAS, ARTERIAL
ALLENS TEST (PASS/FAIL): POSITIVE — AB
Acid-Base Excess: 4.2 mmol/L — ABNORMAL HIGH (ref 0.0–2.0)
Bicarbonate: 29.2 mmol/L — ABNORMAL HIGH (ref 20.0–28.0)
FIO2: 0.28
O2 Saturation: 95.1 %
PH ART: 7.42 (ref 7.350–7.450)
Patient temperature: 37
pCO2 arterial: 45 mmHg (ref 32.0–48.0)
pO2, Arterial: 75 mmHg — ABNORMAL LOW (ref 83.0–108.0)

## 2018-01-15 LAB — PROCALCITONIN: PROCALCITONIN: 0.93 ng/mL

## 2018-01-15 LAB — GLUCOSE, CAPILLARY
GLUCOSE-CAPILLARY: 151 mg/dL — AB (ref 70–99)
Glucose-Capillary: 135 mg/dL — ABNORMAL HIGH (ref 70–99)

## 2018-01-15 LAB — MRSA PCR SCREENING: MRSA BY PCR: POSITIVE — AB

## 2018-01-15 LAB — TROPONIN I: Troponin I: 0.11 ng/mL (ref <0.03)

## 2018-01-15 LAB — PHOSPHORUS: Phosphorus: 6.8 mg/dL — ABNORMAL HIGH (ref 2.5–4.6)

## 2018-01-15 MED ORDER — HEPARIN SODIUM (PORCINE) 5000 UNIT/ML IJ SOLN
5000.0000 [IU] | Freq: Three times a day (TID) | INTRAMUSCULAR | Status: DC
  Administered 2018-01-15 – 2018-01-19 (×11): 5000 [IU] via SUBCUTANEOUS
  Filled 2018-01-15 (×10): qty 1

## 2018-01-15 MED ORDER — NICOTINE 21 MG/24HR TD PT24
21.0000 mg | MEDICATED_PATCH | Freq: Every day | TRANSDERMAL | Status: DC
  Administered 2018-01-15 – 2018-01-18 (×4): 21 mg via TRANSDERMAL
  Filled 2018-01-15 (×4): qty 1

## 2018-01-15 MED ORDER — METHYLPREDNISOLONE SODIUM SUCC 125 MG IJ SOLR
60.0000 mg | Freq: Four times a day (QID) | INTRAMUSCULAR | Status: DC
  Administered 2018-01-15 – 2018-01-19 (×17): 60 mg via INTRAVENOUS
  Filled 2018-01-15 (×16): qty 2

## 2018-01-15 MED ORDER — SODIUM CHLORIDE 0.9 % IV SOLN
1.0000 g | INTRAVENOUS | Status: DC
  Administered 2018-01-15: 1 g via INTRAVENOUS
  Filled 2018-01-15 (×2): qty 1

## 2018-01-15 MED ORDER — FOLIC ACID 1 MG PO TABS
1.0000 mg | ORAL_TABLET | Freq: Every day | ORAL | Status: DC
Start: 2018-01-15 — End: 2018-01-19
  Administered 2018-01-16 – 2018-01-18 (×3): 1 mg via ORAL
  Filled 2018-01-15 (×2): qty 1

## 2018-01-15 MED ORDER — RENA-VITE PO TABS
1.0000 | ORAL_TABLET | Freq: Every day | ORAL | Status: DC
  Administered 2018-01-15 – 2018-01-16 (×2): 1 via ORAL
  Filled 2018-01-15 (×2): qty 1

## 2018-01-15 MED ORDER — MOMETASONE FURO-FORMOTEROL FUM 200-5 MCG/ACT IN AERO
2.0000 | INHALATION_SPRAY | Freq: Two times a day (BID) | RESPIRATORY_TRACT | Status: DC
  Administered 2018-01-15 – 2018-01-18 (×7): 2 via RESPIRATORY_TRACT
  Filled 2018-01-15: qty 8.8

## 2018-01-15 MED ORDER — NITROGLYCERIN 0.4 MG SL SUBL: 0.4000 mg | SUBLINGUAL_TABLET | SUBLINGUAL | Status: DC | PRN

## 2018-01-15 MED ORDER — CHLORHEXIDINE GLUCONATE CLOTH 2 % EX PADS
6.0000 | MEDICATED_PAD | Freq: Every day | CUTANEOUS | Status: DC
  Administered 2018-01-15 – 2018-01-19 (×4): 6 via TOPICAL
  Filled 2018-01-15: qty 6

## 2018-01-15 MED ORDER — TIOTROPIUM BROMIDE MONOHYDRATE 18 MCG IN CAPS
18.0000 ug | ORAL_CAPSULE | Freq: Every day | RESPIRATORY_TRACT | Status: DC
  Administered 2018-01-16 – 2018-01-18 (×3): 18 ug via RESPIRATORY_TRACT
  Filled 2018-01-15: qty 5

## 2018-01-15 MED ORDER — FERROUS SULFATE 325 (65 FE) MG PO TABS
325.0000 mg | ORAL_TABLET | Freq: Three times a day (TID) | ORAL | Status: DC
  Administered 2018-01-15 – 2018-01-18 (×9): 325 mg via ORAL
  Filled 2018-01-15 (×8): qty 1

## 2018-01-15 MED ORDER — DIPHENHYDRAMINE HCL 50 MG/ML IJ SOLN
12.5000 mg | Freq: Once | INTRAMUSCULAR | Status: AC
  Administered 2018-01-15: 12.5 mg via INTRAVENOUS

## 2018-01-15 MED ORDER — SODIUM CHLORIDE 0.9 % IV SOLN
1.0000 g | INTRAVENOUS | Status: DC
  Administered 2018-01-16: 1 g via INTRAVENOUS
  Filled 2018-01-15 (×2): qty 1

## 2018-01-15 MED ORDER — EPOETIN ALFA 4000 UNIT/ML IJ SOLN
4000.0000 [IU] | INTRAMUSCULAR | Status: DC
  Administered 2018-01-15 – 2018-01-17 (×2): 4000 [IU] via INTRAVENOUS
  Filled 2018-01-15 (×3): qty 1

## 2018-01-15 MED ORDER — PANTOPRAZOLE SODIUM 40 MG PO TBEC
40.0000 mg | DELAYED_RELEASE_TABLET | Freq: Every day | ORAL | Status: DC
  Administered 2018-01-16 – 2018-01-18 (×3): 40 mg via ORAL
  Filled 2018-01-15 (×3): qty 1

## 2018-01-15 MED ORDER — HEPARIN SODIUM (PORCINE) 1000 UNIT/ML DIALYSIS: 20.0000 [IU]/kg | INTRAMUSCULAR | Status: DC | PRN

## 2018-01-15 MED ORDER — GABAPENTIN 300 MG PO CAPS
300.0000 mg | ORAL_CAPSULE | Freq: Two times a day (BID) | ORAL | Status: DC
  Administered 2018-01-15 – 2018-01-18 (×7): 300 mg via ORAL
  Filled 2018-01-15 (×7): qty 1

## 2018-01-15 MED ORDER — IPRATROPIUM-ALBUTEROL 0.5-2.5 (3) MG/3ML IN SOLN
3.0000 mL | Freq: Four times a day (QID) | RESPIRATORY_TRACT | Status: DC | PRN
  Administered 2018-01-16: 3 mL via RESPIRATORY_TRACT
  Filled 2018-01-15: qty 3

## 2018-01-15 MED ORDER — VANCOMYCIN HCL IN DEXTROSE 750-5 MG/150ML-% IV SOLN
750.0000 mg | Freq: Once | INTRAVENOUS | Status: AC
  Administered 2018-01-15: 750 mg via INTRAVENOUS
  Filled 2018-01-15: qty 150

## 2018-01-15 MED ORDER — CALCIUM ACETATE (PHOS BINDER) 667 MG PO CAPS
2001.0000 mg | ORAL_CAPSULE | Freq: Three times a day (TID) | ORAL | Status: DC
  Administered 2018-01-16 – 2018-01-18 (×6): 2001 mg via ORAL
  Filled 2018-01-15 (×11): qty 3

## 2018-01-15 MED ORDER — VANCOMYCIN HCL IN DEXTROSE 1-5 GM/200ML-% IV SOLN
1000.0000 mg | Freq: Once | INTRAVENOUS | Status: AC
  Administered 2018-01-15: 1000 mg via INTRAVENOUS
  Filled 2018-01-15: qty 200

## 2018-01-15 MED ORDER — DOCUSATE SODIUM 100 MG PO CAPS
100.0000 mg | ORAL_CAPSULE | Freq: Two times a day (BID) | ORAL | Status: DC | PRN
  Administered 2018-01-17: 100 mg via ORAL
  Filled 2018-01-15: qty 1

## 2018-01-15 MED ORDER — VANCOMYCIN HCL IN DEXTROSE 750-5 MG/150ML-% IV SOLN
750.0000 mg | INTRAVENOUS | Status: DC
  Administered 2018-01-16: 750 mg via INTRAVENOUS
  Filled 2018-01-15: qty 150

## 2018-01-15 MED ORDER — LABETALOL HCL 100 MG PO TABS
100.0000 mg | ORAL_TABLET | Freq: Every day | ORAL | Status: DC
  Administered 2018-01-16 – 2018-01-18 (×3): 100 mg via ORAL
  Filled 2018-01-15 (×5): qty 1

## 2018-01-15 NOTE — Progress Notes (Signed)
Pharmacy Antibiotic Note  Stanley [name] is a 57 y.o. male admitted on 01/15/2018 with pneumonia.  Pharmacy has been consulted for cefepime and vancomycin dosing. Patient receives HD on T Th Sat.   Plan: Will order a loading dose of vancomycin 1g once followed a dose of 750 mg once patient goes to the floor. A maintenance regimen of vancomycin 750 mg on dialysis days will be ordered.  Goal trough 15-20 mcg/mL. Pre-HD level will be ordered with AM labs on 11/9.   Weight: 167 lb 8.8 oz (76 kg)  Temp (24hrs), Avg:99.2 F (37.3 C), Min:99.2 F (37.3 C), Max:99.2 F (37.3 C)  Recent Labs  Lab 01/15/18 0647 01/15/18 0653  WBC 16.4*  --   CREATININE 9.81*  --   LATICACIDVEN  --  1.8    Estimated Creatinine Clearance: 8.8 mL/min (A) (by C-G formula based on SCr of 9.81 mg/dL (H)).    No Known Allergies  Antimicrobials this admission: 11/4 cefepime >>  11/4 vancomycin >>   Dose adjustments this admission: Cefepime adjusted to 1 g q24h due to pt on HD.   Microbiology results:  11/4 MRSA PCR: ordered. - if MRSA PCR is negative consider discontinuing vancomycin at this lab has a 99.2% negative predictive value.   Thank you for allowing pharmacy to be a part of this patient's care.  Oswald Hillock, PharmD  Clinical Pharmacist  01/15/2018 8:43 AM

## 2018-01-15 NOTE — Progress Notes (Signed)
[facility], [place] 01/15/18  Subjective:   Patient known to our practice from outpatient dialysis.  He presents via EMS from home for acute onset of shortness of breath.  Initially he was placed on BiPAP for acute pulmonary edema.  At present he is on nasal cannula oxygen.  Potassium is high at 6.1.  Patient is currently morning.  Abdomen is distended.  His last dialysis was on Saturday.  Urgent nephrology evaluation is requested for dialysis  Objective:  Vital signs in last 24 hours:  Temp:  [99.2 F (37.3 C)] 99.2 F (37.3 C) (11/04 0645) Pulse Rate:  [89-90] 90 (11/04 0800) Resp:  [22-25] 23 (11/04 0800) BP: (128-133)/(85-87) 130/87 (11/04 0800) SpO2:  [97 %-100 %] 97 % (11/04 0800) Weight:  [76 kg] 76 kg (11/04 0643)  Weight change:  Filed Weights   01/15/18 0643  Weight: 76 kg    Intake/Output:   No intake or output data in the 24 hours ending 01/15/18 0905   Physical Exam: General:  Chronically ill-appearing, laying in the bed  HEENT  moist oral mucous membranes  Neck  distended neck veins  Pulm/lungs  bilateral crackles, nasal cannula oxygen  CVS/Heart  regular, no rub  Abdomen:   Severely distended, ascites  Extremities:  2+ lower extremity edema bilaterally  Neurologic:  Moaning, able to answer questions  Skin:  Scattered bruises  Access:  Right IJ PermCath       Basic Metabolic Panel:  Recent Labs  Lab 01/15/18 0647  NA 141  K 6.1*  CL 102  CO2 25  GLUCOSE 102*  BUN 57*  CREATININE 9.81*  CALCIUM 8.4*     CBC: Recent Labs  Lab 01/15/18 0647  WBC 16.4*  NEUTROABS 14.0*  HGB 10.0*  HCT 35.7*  MCV 87.7  PLT 346      Lab Results  Component Value Date   HEPBSAG Negative 06/16/2017   HEPBSAB Reactive 03/01/2017   HEPBIGM Negative 06/01/2016      Microbiology:  No results found for this or any previous visit (from the past 240 hour(s)).  Coagulation Studies: No results for input(s): LABPROT, INR in  the last 72 hours.  Urinalysis: No results for input(s): COLORURINE, LABSPEC, PHURINE, GLUCOSEU, HGBUR, BILIRUBINUR, KETONESUR, PROTEINUR, UROBILINOGEN, NITRITE, LEUKOCYTESUR in the last 72 hours.  Invalid input(s): APPERANCEUR    Imaging: Dg Chest Port 1 View  Result Date: 01/15/2018 CLINICAL DATA:  Respiratory distress. Dialysis patient. Shortness of breath. EXAM: PORTABLE CHEST 1 VIEW COMPARISON:  12/26/2017 FINDINGS: Sternotomy wires and right IJ dialysis catheter unchanged. Lungs are adequately inflated with minimal stable prominence of the perihilar markings likely mild vascular congestion. There is patchy density over the right mid to lower lung which may be due to atelectasis or infection. No effusion. Mild stable cardiomegaly. Remainder the exam is unchanged. IMPRESSION: Patchy density over the right mid to lower lung which may be due to atelectasis or infection. Mild cardiomegaly with mild stable vascular congestion. Electronically Signed   By: Marin Olp M.D.   On: 01/15/2018 07:14     Medications:   . ceFEPime (MAXIPIME) IV 1 g (01/15/18 0855)  . vancomycin    . vancomycin    . [START ON 01/16/2018] vancomycin     . methylPREDNISolone (SOLU-MEDROL) injection  60 mg Intravenous Q6H  . nicotine  21 mg Transdermal Daily     Assessment/ Plan:  57 y.o. African-American male withend stage renal disease on hemodialysis secondary to Alport's syndrome, ascites,  hypertension, anemia of chronic kidney disease, coronary artery disease, peripheral vascular disease, hyperlipidemia, gastrointestinal AVMs, pulmonary hypertension  [facility]   1.  Hyperkalemia 2.  Volume overload with acute pulmonary edema 3.  Tense ascites 4.  End-stage renal disease 5.  Anemia chronic kidney disease 6.  Low-grade fever, elevated WBC  Plan: Urgent hemodialysis on low K bath Volume removal with hemodialysis May need paracentesis Low-grade fever, getting empiric IV  antibiotics Monitor Phos     LOS: 0 Dennison Mcdaid 11/4/20199:05 AM  [place], [place]  Note: This note was prepared with Dragon dictation. Any transcription errors are unintentional

## 2018-01-15 NOTE — ED Provider Notes (Signed)
[facility] Emergency Department Provider Note   First MD Initiated Contact with Patient 01/15/18 [phone]     (approximate)  I have reviewed the triage vital signs and the nursing notes.   HISTORY  Chief Complaint Respiratory Distress    HPI Stanley [name] is a 58 y.o. male with below list of chronic medical conditions including end-stage renal disease receiving dialysis Tuesday Thursday and Saturday presents to the emergency department with acute onset of dyspnea this morning.  On EMS arrival patient with apparent respiratory distress and as such CPAP was applied.  Patient states that he missed dialysis last Thursday however did receive dialysis on Saturday.  Patient stated that this morning abruptly started experiencing rest tori difficulty.   Past Medical History:  Diagnosis Date  . Alcohol abuse   . CHF (congestive heart failure) ([place])   . Cirrhosis ([place])   . Coronary artery disease 2009  . Drug abuse ([place])   . End stage renal disease on dialysis [facility]) NEPHROLOGIST-   DR [facility]  IN [place]   HEMODIALYSIS --   TUES/  THURS/  SAT  . Gastrointestinal bleed 06/13/2017   From chart...hx of multiple GI bleeds  . GERD (gastroesophageal reflux disease)   . Hyperlipidemia   . Hypertension   . PAD (peripheral artery disease) ([place])   . Renal insufficiency    Per pt, 32 oz fluid restriction per day  . S/P triple vessel bypass 06/09/2016   2009ish  . Suicidal ideation    & HOMICIDAL IDEATION --  06-16-2013   ADMITTED TO BEHAVIOR HEALTH    Patient Active Problem List   Diagnosis Date Noted  . Acute pulmonary edema ([place]) 12/23/2017  . Acute respiratory failure ([place]) 10/29/2017  . ESRD (end stage renal disease) on dialysis ([place]) 07/28/2017  . Protein-calorie malnutrition, severe 06/14/2017  . Encounter for dialysis [facility])   . Palliative care by specialist   . Goals of care, counseling/discussion   . Malnutrition of moderate degree 06/05/2017  .  Secondary esophageal varices without bleeding ([place])   . Stomach irritation   . Idiopathic esophageal varices without bleeding ([place])   . Alcoholic hepatitis with ascites 05/24/2017  . ESRD (end stage renal disease) ([place]) 04/28/2017  . Uremia 03/08/2017  . ESRD on hemodialysis ([place]) 03/03/2017  . Weakness 02/28/2017  . Hypocalcemia 02/22/2017  . Shortness of breath 11/26/2016  . COPD (chronic obstructive pulmonary disease) ([place]) 10/30/2016  . COPD exacerbation ([place]) 10/29/2016  . Anemia   . Heme positive stool   . Ulceration of intestine   . Benign neoplasm of transverse colon   . Acute gastrointestinal hemorrhage   . Esophageal candidiasis ([place])   . Angiodysplasia of intestinal tract   . Acute respiratory failure with hypoxia ([place]) 07/03/2016  . GI bleeding 06/24/2016  . Rectal bleeding 06/14/2016  . Anemia of chronic disease 06/01/2016  . MRSA carrier 06/01/2016  . Chronic renal failure 05/23/2016  . Ischemic heart disease 05/23/2016  . Angiodysplasia of small intestine   . Melena   . Small bowel bleed not requiring more than 4 units of blood in 24 hours, ICU, or surgery   . Anemia due to chronic blood loss   . Abdominal pain 05/05/2016  . Acute posthemorrhagic anemia 04/17/2016  . Gastrointestinal bleed 04/17/2016  . History of esophagogastroduodenoscopy (EGD) 04/17/2016  . Elevated troponin 04/17/2016  . Alcohol abuse 04/17/2016  . Upper GI bleed 01/19/2016  . Blood in stool   . Angiodysplasia of stomach  and duodenum with hemorrhage   . Gastritis   . Reflux esophagitis   . GI bleed 05/16/2015  . Acute GI bleeding   . Symptomatic anemia 04/30/2015  . HTN (hypertension) 04/06/2015  . GERD (gastroesophageal reflux disease) 04/06/2015  . HLD (hyperlipidemia) 04/06/2015  . Dyspnea 04/06/2015  . Cirrhosis of liver with ascites ([place]) 04/06/2015  . Ascites 04/06/2015  . GIB (gastrointestinal bleeding) 03/23/2015  . Homicidal ideation 06/19/2013  . Suicidal intent  06/19/2013  . Homicidal ideations 06/19/2013  . Hyperkalemia 06/16/2013  . Mandible fracture ([place]) 06/05/2013  . Fracture, mandible ([place]) 06/02/2013  . Coronary atherosclerosis of native coronary artery 06/02/2013  . ESRD on dialysis ([place]) 06/02/2013  . Mandible open fracture ([place]) 06/02/2013    Past Surgical History:  Procedure Laterality Date  . A/V FISTULAGRAM Right 06/06/2017   Procedure: A/V FISTULAGRAM;  Surgeon: Katha Cabal, MD;  Location: [place] CV LAB;  Service: Cardiovascular;  Laterality: Right;  . A/V SHUNT INTERVENTION N/A 06/06/2017   Procedure: A/V SHUNT INTERVENTION;  Surgeon: Katha Cabal, MD;  Location: [place] CV LAB;  Service: Cardiovascular;  Laterality: N/A;  . AGILE CAPSULE N/A 06/19/2016   Procedure: AGILE CAPSULE;  Surgeon: [name], MD;  Location: [facility] ENDOSCOPY;  Service: Endoscopy;  Laterality: N/A;  . COLONOSCOPY WITH PROPOFOL N/A 06/18/2016   Procedure: COLONOSCOPY WITH PROPOFOL;  Surgeon: [name], MD;  Location: [facility] ENDOSCOPY;  Service: Endoscopy;  Laterality: N/A;  . COLONOSCOPY WITH PROPOFOL N/A 08/12/2016   Procedure: COLONOSCOPY WITH PROPOFOL;  Surgeon: [name], MD;  Location: [facility] ENDOSCOPY;  Service: Endoscopy;  Laterality: N/A;  . COLONOSCOPY WITH PROPOFOL N/A 05/05/2017   Procedure: COLONOSCOPY WITH PROPOFOL;  Surgeon: Manya Silvas, MD;  Location: [facility] ENDOSCOPY;  Service: Endoscopy;  Laterality: N/A;  . CORONARY ANGIOPLASTY  ?   PT UNABLE TO TELL IF  BEFORE OR AFTER  CABG  . CORONARY ARTERY BYPASS GRAFT  2008  ([place])   3 VESSEL  . DIALYSIS FISTULA CREATION  LAST SURGERY  APPOX  2008  . ENTEROSCOPY N/A 05/10/2016   Procedure: ENTEROSCOPY;  Surgeon: Jerene Bears, MD;  Location: [place];  Service: Gastroenterology;  Laterality: N/A;  . ENTEROSCOPY N/A 08/12/2016   Procedure: ENTEROSCOPY;  Surgeon: [name], MD;  Location: [facility] ENDOSCOPY;  Service: Endoscopy;  Laterality: N/A;  . ENTEROSCOPY Left  06/03/2017   Procedure: ENTEROSCOPY;  Surgeon: Virgel Manifold, MD;  Location: [facility] ENDOSCOPY;  Service: Endoscopy;  Laterality: Left;  Procedure date will ultimately depend on when patient is medically optimized before the procedure, pending hemodialysis and blood transfusions etc. Will place on schedule and change depending on clinical status.   . ENTEROSCOPY N/A 06/05/2017   Procedure: ENTEROSCOPY;  Surgeon: Virgel Manifold, MD;  Location: [facility] ENDOSCOPY;  Service: Endoscopy;  Laterality: N/A;  . ENTEROSCOPY N/A 06/15/2017   Procedure: Push ENTEROSCOPY;  Surgeon: [name], MD;  Location: [facility] ENDOSCOPY;  Service: Endoscopy;  Laterality: N/A;  . ESOPHAGOGASTRODUODENOSCOPY N/A 05/07/2015   Procedure: ESOPHAGOGASTRODUODENOSCOPY (EGD);  Surgeon: Hulen Luster, MD;  Location: [facility] ENDOSCOPY;  Service: Endoscopy;  Laterality: N/A;  . ESOPHAGOGASTRODUODENOSCOPY (EGD) WITH PROPOFOL N/A 05/17/2015   Procedure: ESOPHAGOGASTRODUODENOSCOPY (EGD) WITH PROPOFOL;  Surgeon: [name], MD;  Location: [facility] ENDOSCOPY;  Service: Endoscopy;  Laterality: N/A;  . ESOPHAGOGASTRODUODENOSCOPY (EGD) WITH PROPOFOL N/A 01/20/2016   Procedure: ESOPHAGOGASTRODUODENOSCOPY (EGD) WITH PROPOFOL;  Surgeon: [name], MD;  Location: [facility] ENDOSCOPY;  Service: Endoscopy;  Laterality: N/A;  . ESOPHAGOGASTRODUODENOSCOPY (EGD)  WITH PROPOFOL N/A 04/17/2016   Procedure: ESOPHAGOGASTRODUODENOSCOPY (EGD) WITH PROPOFOL;  Surgeon: Lin Landsman, MD;  Location: [facility] ENDOSCOPY;  Service: Gastroenterology;  Laterality: N/A;  . ESOPHAGOGASTRODUODENOSCOPY (EGD) WITH PROPOFOL  05/09/2016   Procedure: ESOPHAGOGASTRODUODENOSCOPY (EGD) WITH PROPOFOL;  Surgeon: Jerene Bears, MD;  Location: [place];  Service: Endoscopy;;  . ESOPHAGOGASTRODUODENOSCOPY (EGD) WITH PROPOFOL N/A 06/16/2016   Procedure: ESOPHAGOGASTRODUODENOSCOPY (EGD) WITH PROPOFOL;  Surgeon: [name], MD;  Location: [facility] ENDOSCOPY;  Service: Endoscopy;  Laterality: N/A;  .  ESOPHAGOGASTRODUODENOSCOPY (EGD) WITH PROPOFOL N/A 05/05/2017   Procedure: ESOPHAGOGASTRODUODENOSCOPY (EGD) WITH PROPOFOL;  Surgeon: Manya Silvas, MD;  Location[facility] END[facility]COPY;  Service: Endoscopy;  Laterality: N/A;  . ESOPHAGOGASTRODUODENOSCOPY (EGD) WITH PROPOFOL N/A 06/15/2017   Procedure: ESOPHAGOGASTRODUODENOSCOPY (EGD) WITH PROPOFOL;  Surgeon: [name], MD;  Location: [facility] ENDOSCOPY;  Service: Endoscopy;  Laterality: N/A;  . GIVENS CAPSULE STUDY N/A 05/07/2016   Procedure: GIVENS CAPSULE STUDY;  Surgeon: Doran Stabler, MD;  Location: [place];  Service: Endoscopy;  Laterality: N/A;  . MANDIBULAR HARDWARE REMOVAL N/A 07/29/2013   Procedure: REMOVAL OF ARCH BARS;  Surgeon: Theodoro Kos, DO;  Location: [place];  Service: Plastics;  Laterality: N/A;  . ORIF MANDIBULAR FRACTURE N/A 06/05/2013   Procedure: REPAIR OF MANDIBULAR FRACTURE x 2 with maxillo-mandibular fixation ;  Surgeon: Theodoro Kos, DO;  Location: [place];  Service: Plastics;  Laterality: N/A;  . PARACENTESIS    . PERIPHERAL ARTERIAL STENT GRAFT Left     Prior to Admission medications   Medication Sig Start Date End Date Taking? Authorizing Provider  budesonide-formoterol (SYMBICORT) 160-4.5 MCG/ACT inhaler Inhale 2 puffs into the lungs daily. 11/16/16   Vaughan Basta, MD  calcium acetate (PHOSLO) 667 MG capsule Take 2,001 mg by mouth 3 (three) times daily.    [provider]  ferrous sulfate 325 (65 FE) MG tablet Take 1 tablet by mouth 3 (three) times daily. 01/21/17   [provider]  folic acid (FOLVITE) 1 MG tablet Take 1 tablet (1 mg total) by mouth daily. 10/31/17   Fritzi Mandes, MD  gabapentin (NEURONTIN) 300 MG capsule Take 300 mg by mouth 2 (two) times daily.  08/16/16   [provider]  ipratropium-albuterol (DUONEB) 0.5-2.5 (3) MG/3ML SOLN Take 3 mLs by nebulization every 6 (six) hours as needed. 11/28/16   Loletha Grayer, MD  labetalol (NORMODYNE) 100 MG  tablet Take 100 mg by mouth daily. 03/24/17   [provider]  multivitamin (RENA-VIT) TABS tablet Take 1 tablet by mouth at bedtime. 10/31/17   Fritzi Mandes, MD  nitroGLYCERIN (NITROSTAT) 0.4 MG SL tablet Place 1 tablet (0.4 mg total) under the tongue every 5 (five) minutes as needed. 04/12/16   Wende Bushy, MD  omeprazole (PRILOSEC) 40 MG capsule Take 40 mg by mouth daily. 09/08/17   [provider]  Spacer/Aero Chamber Mouthpiece MISC 1 Units by Does not apply route every 4 (four) hours as needed (wheezing). 02/19/17   Darel Hong, MD  SPIRIVA HANDIHALER 18 MCG inhalation capsule Place 1 capsule into inhaler and inhale daily. 01/04/18   [provider]  tiotropium (SPIRIVA HANDIHALER) 18 MCG inhalation capsule Place 1 capsule (18 mcg total) into inhaler and inhale daily. 11/16/16 12/23/17  Vaughan Basta, MD    Allergies No known drug allergies  Family History  Problem Relation Age of Onset  . Colon cancer Mother   . Cancer Father   . Cancer Sister   . Kidney disease Brother  Social History Social History   Tobacco Use  . Smoking status: Current Every Day Smoker    Packs/day: 0.15    Years: 40.00    Pack years: 6.00    Types: Cigarettes  . Smokeless tobacco: Never Used  Substance Use Topics  . Alcohol use: No    Comment: pt reports quitting after learning about cirrhosis  . Drug use: No    Frequency: 7.0 times per week    Types: Marijuana, Cocaine    Review of Systems Constitutional: No fever/chills Eyes: No visual changes. ENT: No sore throat. Cardiovascular: Denies chest pain. Respiratory: Positive for dyspnea. Gastrointestinal: No abdominal pain.  No nausea, no vomiting.  No diarrhea.  No constipation. Genitourinary: Negative for dysuria. Musculoskeletal: Negative for neck pain.  Negative for back pain. Integumentary: Negative for rash. Neurological: Negative for headaches, focal weakness or  numbness.   ____________________________________________   PHYSICAL EXAM:  VITAL SIGNS: ED Triage Vitals  Enc Vitals Group     BP 01/15/18 0645 133/85     Pulse Rate 01/15/18 0645 90     Resp 01/15/18 0645 (!) 25     Temp 01/15/18 0645 99.2 F (37.3 C)     Temp Source 01/15/18 0645 Axillary     SpO2 01/15/18 0645 100 %     Weight 01/15/18 0643 76 kg (167 lb 8.8 oz)     Height --      Head Circumference --      Peak Flow --      Pain Score 01/15/18 0643 8     Pain Loc --      Pain Edu? --      Excl. in [place]? --     Constitutional: Alert and oriented.  Apparent respiratory difficulty.   Eyes: Conjunctivae are normal.  Head: Atraumatic. Mouth/Throat: Mucous membranes are moist.  Oropharynx non-erythematous. Neck: No stridor.   Cardiovascular: Normal rate, regular rhythm. Good peripheral circulation. Grossly normal heart sounds. Respiratory: Apnea, positive accessory respiratory muscle use, bibasilar rales Gastrointestinal: Soft and nontender. No distention.  Musculoskeletal: No lower extremity tenderness nor edema. No gross deformities of extremities. Neurologic:  Normal speech and language. No gross focal neurologic deficits are appreciated.  Skin:  Skin is warm, dry and intact. No rash noted. Psychiatric: Mood and affect are normal. Speech and behavior are normal.  ____________________________________________   LABS (all labs ordered are listed, but only abnormal results are displayed)  Labs Reviewed  CBC WITH DIFFERENTIAL/PLATELET - Abnormal; Notable for the following components:      Result Value   WBC 16.4 (*)    RBC 4.07 (*)    Hemoglobin 10.0 (*)    HCT 35.7 (*)    MCH 24.6 (*)    MCHC 28.0 (*)    RDW 19.3 (*)    Neutro Abs 14.0 (*)    Monocytes Absolute 1.5 (*)    Abs Immature Granulocytes 0.13 (*)    All other components within normal limits  BASIC METABOLIC PANEL  TROPONIN I   ____________________________________________  EKG  ED ECG REPORT I,  [place] N BROWN, the attending physician, personally viewed and interpreted this ECG.   Date: 01/15/2018  EKG Time: 40 6 AM  Rate: 90  Rhythm: Normal sinus rhythm  Axis: Normal  Intervals: Normal  ST&T Change: None  ____________________________________________  RADIOLOGY I, [place] N BROWN, personally viewed and evaluated these images (plain radiographs) as part of my medical decision making, as well as reviewing the written report by the radiologist.  ED MD interpretation: Cardiomegaly with residual edema  Official radiology report(s): No results found.  ____________________________________________   PROCEDURES  Critical Care performed:   .Critical Care Performed by: Gregor Hams, MD Authorized by: Gregor Hams, MD   Critical care provider statement:    Critical care time (minutes):  30   Critical care time was exclusive of:  Separately billable procedures and treating other patients   Critical care was time spent personally by me on the following activities:  Development of treatment plan with patient or surrogate, discussions with consultants, evaluation of patient's response to treatment, examination of patient, obtaining history from patient or surrogate, ordering and performing treatments and interventions, ordering and review of laboratory studies, ordering and review of radiographic studies, pulse oximetry, re-evaluation of patient's condition and review of old charts   I assumed direction of critical care for this patient from another provider in my specialty: no       ____________________________________________   INITIAL IMPRESSION / ASSESSMENT AND PLAN / ED COURSE  As part of my medical decision making, I reviewed the following data within the electronic MEDICAL RECORD NUMBER   57 year old male presenting with above-stated history and physical exam secondary to respiratory difficulty.  Suspect pulmonary edema secondary to volume overload related to  end-stage renal disease.  BiPAP applied to the patient on arrival.  Patient discussed with Dr. [name] for hospital admission for dialysis.  Patient also be discussed with hospitalist   ____________________________________________  FINAL CLINICAL IMPRESSION(S) / ED DIAGNOSES  Final diagnoses:  Acute pulmonary edema ([place])     MEDICATIONS GIVEN DURING THIS VISIT:  Medications - No data to display   ED Discharge Orders    None       Note:  This document was prepared using Dragon voice recognition software and may include unintentional dictation errors.    Gregor Hams, MD 01/15/18 [phone]

## 2018-01-15 NOTE — ED Triage Notes (Signed)
Patient coming [place] from home for Respiratory distress. Patient goes to dialysis T/Th/Sat, missed Thursday appointment. Patient woke up SOB, respiratory distress this AM. Patient does not normally wear oxygen at home, 85% oxygen saturation on RA at EMS arrival. Patient with fluid throughout lungs. Patient on Cpap at arrival to this ED.

## 2018-01-15 NOTE — Progress Notes (Signed)
Pt has decreased LOC and not following commands. [place], DO made aware and is at the bedside. All PO meds will be held at this time. Heparin will be held also until after CT scan.

## 2018-01-15 NOTE — Progress Notes (Signed)
Post HD assessment    01/15/18 1351  Neurological  Level of Consciousness Responds to Pain  Orientation Level Oriented X4  Respiratory  Respiratory Pattern Regular;Unlabored  Chest Assessment Chest expansion symmetrical  Cough Non-productive  Cardiac  ECG Monitor Yes  Cardiac Rhythm NSR  Ectopy Unifocal PVC's  Ectopy Frequency Occasional  Vascular  R Radial Pulse +2  L Radial Pulse +2  Edema Generalized  Integumentary  Integumentary (WDL) X  Skin Color Appropriate for ethnicity  Musculoskeletal  Musculoskeletal (WDL) X  Generalized Weakness Yes  Assistive Device None  GU Assessment  Genitourinary (WDL) X  Genitourinary Symptoms  (HD)  Psychosocial  Psychosocial (WDL) WDL

## 2018-01-15 NOTE — Progress Notes (Signed)
HD tx end    01/15/18 1340  Vital Signs  Pulse Rate 95  Pulse Rate Source Monitor  Resp 15  BP 140/76  BP Location Left Arm  BP Method Automatic  Patient Position (if appropriate) Sitting  Oxygen Therapy  SpO2 97 %  O2 Device Nasal Cannula  O2 Flow Rate (L/min) 2 L/min  During Hemodialysis Assessment  Dialysis Fluid Bolus Normal Saline  Bolus Amount (mL) 250 mL  Intra-Hemodialysis Comments Tx completed

## 2018-01-15 NOTE — Progress Notes (Signed)
Patient lethargic at this time and respond to verbal command by nodding his head. [place] NP notified and she went to the bedside to assess hm. New order received for Neurology consult and MR brain to be done. Oral medications scheduled at this time are  held. Will continue to monitor.

## 2018-01-15 NOTE — Progress Notes (Signed)
HD tx start    01/15/18 1002  Vital Signs  Pulse Rate 92  Pulse Rate Source Monitor  Resp (!) 27  BP 118/72  BP Location Left Arm  BP Method Automatic  Patient Position (if appropriate) Sitting  Oxygen Therapy  SpO2 95 %  O2 Device Nasal Cannula  O2 Flow Rate (L/min) 2 L/min  During Hemodialysis Assessment  Blood Flow Rate (mL/min) 400 mL/min  Arterial Pressure (mmHg) -140 mmHg  Venous Pressure (mmHg) 150 mmHg  Transmembrane Pressure (mmHg) 40 mmHg  Ultrafiltration Rate (mL/min) 1000 mL/min  Dialysate Flow Rate (mL/min) 800 ml/min  Conductivity: Machine  14.2  HD Safety Checks Performed Yes  Dialysis Fluid Bolus Normal Saline  Bolus Amount (mL) 250 mL  Intra-Hemodialysis Comments Tx initiated  Hemodialysis Catheter Right Internal jugular Double-lumen  No Placement Date or Time found.   Placed prior to admission: Yes  Orientation: Right  Access Location: Internal jugular  Hemodialysis Catheter Type: Double-lumen  Blue Lumen Status Infusing  Red Lumen Status Infusing

## 2018-01-15 NOTE — Progress Notes (Signed)
Post HD assessment. Pt tolerated tx well without c/o or complication. Post temp slightly elevated,MD aware, blood cultures x2 drawn during HD tx. Net UF 3049, goal met.    01/15/18 1352  Vital Signs  Temp 99 F (37.2 C)  Temp Source Oral  Pulse Rate 93  Pulse Rate Source Monitor  Resp (!) 21  BP 136/78  BP Location Left Arm  BP Method Automatic  Patient Position (if appropriate) Sitting  Oxygen Therapy  SpO2 96 %  O2 Device Nasal Cannula  O2 Flow Rate (L/min) 2 L/min  Dialysis Weight  Weight 75 kg  Type of Weight Post-Dialysis  Post-Hemodialysis Assessment  Rinseback Volume (mL) 250 mL  KECN 81.4 V  Dialyzer Clearance Lightly streaked  Duration of HD Treatment -hour(s) 3.5 hour(s)  Hemodialysis Intake (mL) 700 mL  UF Total -Machine (mL) 3749 mL  Net UF (mL) 3049 mL  Education / Care Plan  Dialysis Education Provided Yes  Documented Education in Care Plan Yes  Hemodialysis Catheter Right Internal jugular Double-lumen  No Placement Date or Time found.   Placed prior to admission: Yes  Orientation: Right  Access Location: Internal jugular  Hemodialysis Catheter Type: Double-lumen  Site Condition No complications  Blue Lumen Status Heparin locked  Red Lumen Status Heparin locked  Purple Lumen Status N/A  Catheter fill solution Heparin 1000 units/ml  Catheter fill volume (Arterial) 1.5 cc  Catheter fill volume (Venous) 1.5  Dressing Type Biopatch  Dressing Status Dressing changed  Interventions New dressing  Drainage Description None  Dressing Change Due 01/22/18  Post treatment catheter status Capped and Clamped   

## 2018-01-15 NOTE — ED Notes (Signed)
RT at bedside to place patient on BiPap

## 2018-01-15 NOTE — Progress Notes (Signed)
Pre HD assessment   01/15/18 0953  Vital Signs  Temp 98.3 F (36.8 C)  Temp Source Oral  Pulse Rate 91  Pulse Rate Source Monitor  Resp (!) 23  BP (!) 171/23  BP Location Left Arm  BP Method Automatic  Patient Position (if appropriate) Sitting  Oxygen Therapy  SpO2 94 %  O2 Device Nasal Cannula  O2 Flow Rate (L/min) 2 L/min  Pain Assessment  Pain Scale 0-10  Pain Score 9  Pain Type Acute pain  Pain Location Foot  Pain Orientation Left  Pain Descriptors / Indicators Aching  Pain Onset On-going  Pain Intervention(s) RN made aware  Dialysis Weight  Weight 78 kg  Type of Weight Pre-Dialysis  Time-Out for Hemodialysis  What Procedure? HD  Pt Identifiers(min of two) First/Last Name;MRN/Account#  Correct Site? Yes  Correct Side? Yes  Correct Procedure? Yes  Consents Verified? Yes  Rad Studies Available? N/A  Safety Precautions Reviewed? Yes  Research scientist (physical sciences)  (2A)  Station Number 1  UF/Alarm Test Passed  Conductivity: Meter 14  Conductivity: Machine  14.2  pH 7.6  Reverse Osmosis main  Normal Saline Lot Number [id number]  Dialyzer Lot Number [id number]  Disposable Set Lot Number [id number]  Machine Temperature 98.6 F (37 C)  Musician and Audible Yes  Blood Lines Intact and Secured Yes  Pre Treatment Patient Checks  Vascular access used during treatment Catheter  Patient is receiving dialysis in a chair Yes  Hepatitis B Surface Antigen Results Negative  Date Hepatitis B Surface Antigen Drawn 06/16/17  Hepatitis B Surface Antibody  (>10)  Date Hepatitis B Surface Antibody Drawn 06/16/17  Hemodialysis Consent Verified Yes  Hemodialysis Standing Orders Initiated Yes  ECG (Telemetry) Monitor On Yes  Prime Ordered Normal Saline  Length of  DialysisTreatment -hour(s) 3.5 Hour(s)  Dialyzer Elisio 17H NR  Dialysate 2K, 2.5 Ca  Dialysis Anticoagulant None  Dialysate Flow Ordered 800  Blood Flow Rate Ordered 400 mL/min  Ultrafiltration Goal 3 Liters   Pre Treatment Labs Phosphorus (blood culture x2, lactic acid )  Dialysis Blood Pressure Support Ordered Normal Saline  Education / Care Plan  Dialysis Education Provided Yes  Documented Education in Care Plan Yes  Hemodialysis Catheter Right Internal jugular Double-lumen  No Placement Date or Time found.   Placed prior to admission: Yes  Orientation: Right  Access Location: Internal jugular  Hemodialysis Catheter Type: Double-lumen  Site Condition No complications  Blue Lumen Status Heparin locked  Red Lumen Status Heparin locked  Purple Lumen Status N/A  Dressing Type Gauze/Drain sponge  Dressing Status Clean;Dry;Intact  Drainage Description None

## 2018-01-15 NOTE — ED Notes (Addendum)
Prepared patient to go to dialysis.  Report given.  Prepared for transport.  Patient belongings sent with patient -- clothing, cell phone and keys.  Vancomycin dose sent with patient to be given at the end of dialysis per Dr. [name].

## 2018-01-15 NOTE — Consult Note (Signed)
Reason for Consult:Shortness of breath Referring Physician: Hospitalist Service  Stanley [name] is an 57 y.o. male.  HPI: Stanley [name] is a 57 year old African-American male with a past medical history remarkable for congestive heart failure, cirrhosis, COPD, alcohol use, prior history of drug abuse, coronary artery disease, peripheral arterial disease, hypertension, hyperlipidemia, gastroesophageal reflux disease, end-stage renal disease on Tuesday Thursday Saturday hemodialysis, presented from home with acute onset of shortness of breath.  He was initially started on BiPAP in the emergency department and was also noted to be hyperkalemic with potassium of 6.1.  He was taken for emergent hemodialysis and arrives in the intensive care unit on nasal cannula.  Still is complaining of some shortness of breath but appears comfortable with stable hemodynamics  Past Medical History:  Diagnosis Date  . Alcohol abuse   . CHF (congestive heart failure) ([place])   . Cirrhosis ([place])   . Coronary artery disease 2009  . Drug abuse ([place])   . End stage renal disease on dialysis [facility]) NEPHROLOGIST-   DR [facility]  IN [place]   HEMODIALYSIS --   TUES/  THURS/  SAT  . Gastrointestinal bleed 06/13/2017   From chart...hx of multiple GI bleeds  . GERD (gastroesophageal reflux disease)   . Hyperlipidemia   . Hypertension   . PAD (peripheral artery disease) ([place])   . Renal insufficiency    Per pt, 32 oz fluid restriction per day  . S/P triple vessel bypass 06/09/2016   2009ish  . Suicidal ideation    & HOMICIDAL IDEATION --  06-16-2013   ADMITTED TO BEHAVIOR HEALTH    Past Surgical History:  Procedure Laterality Date  . A/V FISTULAGRAM Right 06/06/2017   Procedure: A/V FISTULAGRAM;  Surgeon: Katha Cabal, MD;  Location: [place] CV LAB;  Service: Cardiovascular;  Laterality: Right;  . A/V SHUNT INTERVENTION N/A 06/06/2017   Procedure: A/V SHUNT INTERVENTION;  Surgeon: Katha Cabal, MD;   Location: [place] CV LAB;  Service: Cardiovascular;  Laterality: N/A;  . AGILE CAPSULE N/A 06/19/2016   Procedure: AGILE CAPSULE;  Surgeon: [name], MD;  Location: [facility] ENDOSCOPY;  Service: Endoscopy;  Laterality: N/A;  . COLONOSCOPY WITH PROPOFOL N/A 06/18/2016   Procedure: COLONOSCOPY WITH PROPOFOL;  Surgeon: [name], MD;  Location: [facility] ENDOSCOPY;  Service: Endoscopy;  Laterality: N/A;  . COLONOSCOPY WITH PROPOFOL N/A 08/12/2016   Procedure: COLONOSCOPY WITH PROPOFOL;  Surgeon: [name], MD;  Location: [facility] ENDOSCOPY;  Service: Endoscopy;  Laterality: N/A;  . COLONOSCOPY WITH PROPOFOL N/A 05/05/2017   Procedure: COLONOSCOPY WITH PROPOFOL;  Surgeon: Manya Silvas, MD;  Location: [facility] ENDOSCOPY;  Service: Endoscopy;  Laterality: N/A;  . CORONARY ANGIOPLASTY  ?   PT UNABLE TO TELL IF  BEFORE OR AFTER  CABG  . CORONARY ARTERY BYPASS GRAFT  2008  ([place] , [place])   3 VESSEL  . DIALYSIS FISTULA CREATION  LAST SURGERY  APPOX  2008  . ENTEROSCOPY N/A 05/10/2016   Procedure: ENTEROSCOPY;  Surgeon: Jerene Bears, MD;  Location: [place];  Service: Gastroenterology;  Laterality: N/A;  . ENTEROSCOPY N/A 08/12/2016   Procedure: ENTEROSCOPY;  Surgeon: [name], MD;  Location: [facility] ENDOSCOPY;  Service: Endoscopy;  Laterality: N/A;  . ENTEROSCOPY Left 06/03/2017   Procedure: ENTEROSCOPY;  Surgeon: Virgel Manifold, MD;  Location: [facility] ENDOSCOPY;  Service: Endoscopy;  Laterality: Left;  Procedure date will ultimately depend on when patient is medically optimized before the procedure, pending hemodialysis and blood transfusions etc. Will place  on schedule and change depending on clinical status.   . ENTEROSCOPY N/A 06/05/2017   Procedure: ENTEROSCOPY;  Surgeon: Virgel Manifold, MD;  Location: [facility] ENDOSCOPY;  Service: Endoscopy;  Laterality: N/A;  . ENTEROSCOPY N/A 06/15/2017   Procedure: Push ENTEROSCOPY;  Surgeon: [name], MD;  Location: [facility] ENDOSCOPY;  Service: Endoscopy;   Laterality: N/A;  . ESOPHAGOGASTRODUODENOSCOPY N/A 05/07/2015   Procedure: ESOPHAGOGASTRODUODENOSCOPY (EGD);  Surgeon: Hulen Luster, MD;  Location: [facility] ENDOSCOPY;  Service: Endoscopy;  Laterality: N/A;  . ESOPHAGOGASTRODUODENOSCOPY (EGD) WITH PROPOFOL N/A 05/17/2015   Procedure: ESOPHAGOGASTRODUODENOSCOPY (EGD) WITH PROPOFOL;  Surgeon: [name], MD;  Location: [facility] ENDOSCOPY;  Service: Endoscopy;  Laterality: N/A;  . ESOPHAGOGASTRODUODENOSCOPY (EGD) WITH PROPOFOL N/A 01/20/2016   Procedure: ESOPHAGOGASTRODUODENOSCOPY (EGD) WITH PROPOFOL;  Surgeon: [name], MD;  Location: [facility] ENDOSCOPY;  Service: Endoscopy;  Laterality: N/A;  . ESOPHAGOGASTRODUODENOSCOPY (EGD) WITH PROPOFOL N/A 04/17/2016   Procedure: ESOPHAGOGASTRODUODENOSCOPY (EGD) WITH PROPOFOL;  Surgeon: Lin Landsman, MD;  Location: [facility] ENDOSCOPY;  Service: Gastroenterology;  Laterality: N/A;  . ESOPHAGOGASTRODUODENOSCOPY (EGD) WITH PROPOFOL  05/09/2016   Procedure: ESOPHAGOGASTRODUODENOSCOPY (EGD) WITH PROPOFOL;  Surgeon: Jerene Bears, MD;  Location: [place];  Service: Endoscopy;;  . ESOPHAGOGASTRODUODENOSCOPY (EGD) WITH PROPOFOL N/A 06/16/2016   Procedure: ESOPHAGOGASTRODUODENOSCOPY (EGD) WITH PROPOFOL;  Surgeon: [name], MD;  Location: [facility] ENDOSCOPY;  Service: Endoscopy;  Laterality: N/A;  . ESOPHAGOGASTRODUODENOSCOPY (EGD) WITH PROPOFOL N/A 05/05/2017   Procedure: ESOPHAGOGASTRODUODENOSCOPY (EGD) WITH PROPOFOL;  Surgeon: Manya Silvas, MD;  Location: [facility] ENDOSCOPY;  Service: Endoscopy;  Laterality: N/A;  . ESOPHAGOGASTRODUODENOSCOPY (EGD) WITH PROPOFOL N/A 06/15/2017   Procedure: ESOPHAGOGASTRODUODENOSCOPY (EGD) WITH PROPOFOL;  Surgeon: [name], MD;  Location: [facility] ENDOSCOPY;  Service: Endoscopy;  Laterality: N/A;  . GIVENS CAPSULE STUDY N/A 05/07/2016   Procedure: GIVENS CAPSULE STUDY;  Surgeon: Doran Stabler, MD;  Location: [place];  Service: Endoscopy;  Laterality: N/A;  . MANDIBULAR HARDWARE REMOVAL N/A  07/29/2013   Procedure: REMOVAL OF ARCH BARS;  Surgeon: Theodoro Kos, DO;  Location: [place];  Service: Plastics;  Laterality: N/A;  . ORIF MANDIBULAR FRACTURE N/A 06/05/2013   Procedure: REPAIR OF MANDIBULAR FRACTURE x 2 with maxillo-mandibular fixation ;  Surgeon: Theodoro Kos, DO;  Location: [place];  Service: Plastics;  Laterality: N/A;  . PARACENTESIS    . PERIPHERAL ARTERIAL STENT GRAFT Left     Family History  Problem Relation Age of Onset  . Colon cancer Mother   . Cancer Father   . Cancer Sister   . Kidney disease Brother     Social History:  reports that he has been smoking cigarettes. He has a 6.00 pack-year smoking history. He has never used smokeless tobacco. He reports that he does not drink alcohol or use drugs.  Allergies: No Known Allergies  Medications: I have reviewed the patient's current medications.  Results for orders placed or performed during the hospital encounter of 01/15/18 (from the past 48 hour(s))  CBC with Differential     Status: Abnormal   Collection Time: 01/15/18  6:47 AM  Result Value Ref Range   WBC 16.4 (H) 4.0 - 10.5 K/uL   RBC 4.07 (L) 4.22 - 5.81 MIL/uL   Hemoglobin 10.0 (L) 13.0 - 17.0 g/dL   HCT 35.7 (L) 39.0 - 52.0 %   MCV 87.7 80.0 - 100.0 fL   MCH 24.6 (L) 26.0 - 34.0 pg   MCHC 28.0 (L) 30.0 - 36.0 g/dL   RDW 19.3 (H) 11.5 -  15.5 %   Platelets 346 150 - 400 K/uL   nRBC 0.0 0.0 - 0.2 %   Neutrophils Relative % 86 %   Neutro Abs 14.0 (H) 1.7 - 7.7 K/uL   Lymphocytes Relative 4 %   Lymphs Abs 0.7 0.7 - 4.0 K/uL   Monocytes Relative 9 %   Monocytes Absolute 1.5 (H) 0.1 - 1.0 K/uL   Eosinophils Relative 0 %   Eosinophils Absolute 0.0 0.0 - 0.5 K/uL   Basophils Relative 0 %   Basophils Absolute 0.1 0.0 - 0.1 K/uL   Immature Granulocytes 1 %   Abs Immature Granulocytes 0.13 (H) 0.00 - 0.07 K/uL    Comment: Performed at [facility], [address] [place], [place] [address]  Basic metabolic panel      Status: Abnormal   Collection Time: 01/15/18  6:47 AM  Result Value Ref Range   Sodium 141 135 - 145 mmol/L   Potassium 6.1 (H) 3.5 - 5.1 mmol/L   Chloride 102 98 - 111 mmol/L   CO2 25 22 - 32 mmol/L   Glucose, Bld 102 (H) 70 - 99 mg/dL   BUN 57 (H) 6 - 20 mg/dL   Creatinine, Ser 9.81 (H) 0.61 - 1.24 mg/dL   Calcium 8.4 (L) 8.9 - 10.3 mg/dL   GFR calc non Af Amer 5 (L) >60 mL/min   GFR calc Af Amer 6 (L) >60 mL/min    Comment: (NOTE) The eGFR has been calculated using the CKD EPI equation. This calculation has not been validated in all clinical situations. eGFR's persistently <60 mL/min signify possible Chronic Kidney Disease.    Anion gap 14 5 - 15    Comment: Performed at [facility], [address] [place], [place] [address]  Troponin I     Status: Abnormal   Collection Time: 01/15/18  6:47 AM  Result Value Ref Range   Troponin I 0.11 (HH) <0.03 ng/mL    Comment: CRITICAL RESULT CALLED TO, READ BACK BY AND VERIFIED WITH Gwynn Burly AT 0174 01/15/18 DAS Performed at [facility], [address] [place], [place] [address]   Procalcitonin - Baseline     Status: None   Collection Time: 01/15/18  6:47 AM  Result Value Ref Range   Procalcitonin 0.93 ng/mL    Comment:        Interpretation: PCT > 0.5 ng/mL and <= 2 ng/mL: Systemic infection (sepsis) is possible, but other conditions are known to elevate PCT as well. (NOTE)       Sepsis PCT Algorithm           Lower Respiratory Tract                                      Infection PCT Algorithm    ----------------------------     ----------------------------         PCT < 0.25 ng/mL                PCT < 0.10 ng/mL         Strongly encourage             Strongly discourage   discontinuation of antibiotics    initiation of antibiotics    ----------------------------     -----------------------------       PCT 0.25 - 0.50 ng/mL            PCT  0.10 - 0.25 ng/mL               OR       >80% decrease in PCT             Discourage initiation of                                            antibiotics      Encourage discontinuation           of antibiotics    ----------------------------     -----------------------------         PCT >= 0.50 ng/mL              PCT 0.26 - 0.50 ng/mL                AND       <80% decrease in PCT             Encourage initiation of                                             antibiotics       Encourage continuation           of antibiotics    ----------------------------     -----------------------------        PCT >= 0.50 ng/mL                  PCT > 0.50 ng/mL               AND         increase in PCT                  Strongly encourage                                      initiation of antibiotics    Strongly encourage escalation           of antibiotics                                     -----------------------------                                           PCT <= 0.25 ng/mL                                                 OR                                        > 80% decrease in PCT  Discontinue / Do not initiate                                             antibiotics Performed at [facility], [address] [place], [place] [address]   Lactic acid, plasma     Status: None   Collection Time: 01/15/18  6:53 AM  Result Value Ref Range   Lactic Acid, Venous 1.8 0.5 - 1.9 mmol/L    Comment: Performed at [facility], [address] [place], [place] [address]  Lactic acid, plasma     Status: None   Collection Time: 01/15/18 10:56 AM  Result Value Ref Range   Lactic Acid, Venous 0.8 0.5 - 1.9 mmol/L    Comment: Performed at [facility], [address] [place], [place] [address]  Phosphorus     Status: Abnormal   Collection Time: 01/15/18 10:56 AM  Result Value Ref Range   Phosphorus 6.8 (H) 2.5 - 4.6 mg/dL    Comment: Performed at [facility] [address]  [place],  [address]    Dg Chest Port 1 View  Result Date: 01/15/2018 CLINICAL DATA:  Respiratory distress. Dialysis patient. Shortness of breath. EXAM: PORTABLE CHEST 1 VIEW COMPARISON:  12/26/2017 FINDINGS: Sternotomy wires and right IJ dialysis catheter unchanged. Lungs are adequately inflated with minimal stable prominence of the perihilar markings likely mild vascular congestion. There is patchy density over the right mid to lower lung which may be due to atelectasis or infection. No effusion. Mild stable cardiomegaly. Remainder the exam is unchanged. IMPRESSION: Patchy density over the right mid to lower lung which may be due to atelectasis or infection. Mild cardiomegaly with mild stable vascular congestion. Electronically Signed   By: Marin Olp M.D.   On: 01/15/2018 07:14    ROS  Please see HPI for pertinent review of systems all else negative  Blood pressure 128/71, pulse 92, temperature 98 F (36.7 C), temperature source Oral, resp. rate (!) 22, height '6\' 3"'$  (1.905 m), weight 81.6 kg, SpO2 100 %.   Physical Exam General: Patient is awake alert and in no acute distress, presently eating, on nasal cannula with stable hemodynamics HEENT: Trachea midline, no oral lesions noted, mild jugular venous distention is appreciated no thyromegaly noted Cardiovascular: Regular rate and rhythm Pulmonary: Prolonged expiratory phase, diffuse rhonchi right greater than left Abdominal: Positive bowel sounds, soft exam Extremities: No clubbing, cyanosis or edema noted Neurologic: Patient was all extremities, no focal deficits appreciated Cutaneous: No rashes or lesions noted  Assessment/Plan:  Respiratory failure.  Patient is now status post hemodialysis, 3 L were removed, has been successfully weaned off of BiPAP presently on nasal cannula with stable hemodynamics.  Patient does have evidence of a right middle to lower lobe infiltrate on his chest x-ray, febrile and white count.  Agree with  empiric broad-spectrum antibiotic coverage.  He also has evidence of bronchospasm on exam, agree with Solu-Medrol, albuterol, Atrovent and inhaled steroid.  Hyperkalemia.  Status post hemodialysis, pending repeat BMP  Ascites.  Patient in need of paracentesis per IR  If patient's BMP is stable will transfer to floor  Arrion Burruel 01/15/2018, 2:47 PM

## 2018-01-15 NOTE — Progress Notes (Signed)
Patient noted to be sitting up and eating the left over of his dinner after the incident of lethargic. Alert and oriented x 4 now and communication with this RN. Denied any acute pain. Patient accepted all his oral medications except Phoslo. [place] NP also observed  patient to be back to his baseline. She indicated we should still go ahead and get the MRI brain. Will continue to monitor.

## 2018-01-15 NOTE — Progress Notes (Signed)
Patient ID: Stanley [name], male   DOB: 01-05-1961, 57 y.o.   MRN: [id number] Pulmonary/critical care  Called to see patient secondary to decreased level of responsiveness.  He does awaken look briefly but is somnolent.  Unclear etiology.  Will obtain stat arterial blood gas and CT scan of the head with close follow-up  Hermelinda Dellen, DO

## 2018-01-15 NOTE — Progress Notes (Signed)
RN unavailable to give report, 1st attempt to get report    01/15/18 1356  Hand-Off documentation  Report given to (Full Name) Delecia,RN  Report received from (Full Name) Stark Bray

## 2018-01-15 NOTE — ED Notes (Signed)
Patient c/o throat being dry and not wanting to keep Bipap on any longer. Dr. Anselm Jungling alerted.  Ok to take patient off for a trial.  Bipap discontinued. Patient placed on 2l/ [place].  Tolerating well. SpO2 94-96%.  Patient repositioned for comfort.

## 2018-01-15 NOTE — Progress Notes (Signed)
  Patient ID: Stanley [name], male   DOB: 1961-02-29, 57 y.o.   MRN: [id number]   Pulmonary/critical care  Arterial blood gas reveals compensated hypercapnia CT scan of the head does not show any acute intracranial abnormality Upon arrival back from CT scan of the head patient is awake alert and communicating moves all extremities without any clear deficit.  Will follow neurologic checks closely if recurs will consult neurology  Hermelinda Dellen, DO

## 2018-01-15 NOTE — Progress Notes (Signed)
Pre HD assessment    01/15/18 0954  Neurological  Level of Consciousness Alert  Orientation Level Oriented X4  Respiratory  Respiratory Pattern Dyspnea at rest;Dyspnea with exertion;Tachypnea  Chest Assessment Chest expansion symmetrical  Cough Non-productive  Cardiac  ECG Monitor Yes  Cardiac Rhythm NSR  Vascular  R Radial Pulse +2  L Radial Pulse +2  Edema Generalized  Integumentary  Integumentary (WDL) X  Skin Color Appropriate for ethnicity  Musculoskeletal  Musculoskeletal (WDL) X  Generalized Weakness Yes  Assistive Device None  GU Assessment  Genitourinary (WDL) X  Genitourinary Symptoms  (HD)  Psychosocial  Psychosocial (WDL) WDL

## 2018-01-16 ENCOUNTER — Other Ambulatory Visit: Payer: Medicare Other

## 2018-01-16 ENCOUNTER — Inpatient Hospital Stay: Payer: Medicare Other

## 2018-01-16 DIAGNOSIS — R4182 Altered mental status, unspecified: Secondary | ICD-10-CM

## 2018-01-16 LAB — BASIC METABOLIC PANEL
ANION GAP: 12 (ref 5–15)
BUN: 42 mg/dL — AB (ref 6–20)
CO2: 27 mmol/L (ref 22–32)
Calcium: 8.5 mg/dL — ABNORMAL LOW (ref 8.9–10.3)
Chloride: 101 mmol/L (ref 98–111)
Creatinine, Ser: 7.53 mg/dL — ABNORMAL HIGH (ref 0.61–1.24)
GFR calc Af Amer: 8 mL/min — ABNORMAL LOW (ref >60)
GFR, EST NON AFRICAN AMERICAN: 7 mL/min — AB (ref >60)
Glucose, Bld: 171 mg/dL — ABNORMAL HIGH (ref 70–99)
Potassium: 5.5 mmol/L — ABNORMAL HIGH (ref 3.5–5.1)
SODIUM: 140 mmol/L (ref 135–145)

## 2018-01-16 LAB — CBC
HCT: 33.9 % — ABNORMAL LOW (ref 39.0–52.0)
HEMOGLOBIN: 9.4 g/dL — AB (ref 13.0–17.0)
MCH: 24.4 pg — ABNORMAL LOW (ref 26.0–34.0)
MCHC: 27.7 g/dL — AB (ref 30.0–36.0)
MCV: 88.1 fL (ref 80.0–100.0)
Platelets: 317 10*3/uL (ref 150–400)
RBC: 3.85 MIL/uL — AB (ref 4.22–5.81)
RDW: 19.3 % — ABNORMAL HIGH (ref 11.5–15.5)
WBC: 11.9 10*3/uL — AB (ref 4.0–10.5)
nRBC: 0 % (ref 0.0–0.2)

## 2018-01-16 LAB — PROCALCITONIN: Procalcitonin: 0.99 ng/mL

## 2018-01-16 MED ORDER — GUAIFENESIN-DM 100-10 MG/5ML PO SYRP
10.0000 mL | ORAL_SOLUTION | Freq: Four times a day (QID) | ORAL | Status: DC | PRN
  Administered 2018-01-16: 10 mL via ORAL
  Filled 2018-01-16 (×2): qty 10

## 2018-01-16 MED ORDER — MUPIROCIN 2 % EX OINT
1.0000 "application " | TOPICAL_OINTMENT | Freq: Two times a day (BID) | CUTANEOUS | Status: DC
  Administered 2018-01-16 – 2018-01-18 (×6): 1 via NASAL
  Filled 2018-01-16 (×2): qty 22

## 2018-01-16 MED ORDER — ACETAMINOPHEN 325 MG PO TABS
650.0000 mg | ORAL_TABLET | Freq: Four times a day (QID) | ORAL | Status: DC | PRN
  Administered 2018-01-17: 650 mg via ORAL
  Filled 2018-01-16: qty 2

## 2018-01-16 MED ORDER — IPRATROPIUM-ALBUTEROL 0.5-2.5 (3) MG/3ML IN SOLN: 3.0000 mL | RESPIRATORY_TRACT | Status: DC | PRN

## 2018-01-16 MED ORDER — PROMETHAZINE HCL 25 MG/ML IJ SOLN: 12.5000 mg | Freq: Four times a day (QID) | INTRAMUSCULAR | Status: DC | PRN

## 2018-01-16 MED ORDER — MORPHINE SULFATE (PF) 2 MG/ML IV SOLN
1.0000 mg | INTRAVENOUS | Status: DC | PRN
  Administered 2018-01-16 – 2018-01-19 (×10): 1 mg via INTRAVENOUS
  Filled 2018-01-16 (×11): qty 1

## 2018-01-16 MED ORDER — METOCLOPRAMIDE HCL 5 MG/ML IJ SOLN
5.0000 mg | Freq: Two times a day (BID) | INTRAMUSCULAR | Status: DC
  Administered 2018-01-16 – 2018-01-18 (×4): 5 mg via INTRAVENOUS
  Filled 2018-01-16 (×5): qty 2

## 2018-01-16 MED ORDER — METOCLOPRAMIDE HCL 5 MG/5ML PO SOLN
5.0000 mg | Freq: Three times a day (TID) | ORAL | Status: DC
  Filled 2018-01-16: qty 5

## 2018-01-16 MED ORDER — PROMETHAZINE HCL 25 MG/ML IJ SOLN
12.5000 mg | Freq: Once | INTRAMUSCULAR | Status: AC
  Administered 2018-01-16: 12.5 mg via INTRAVENOUS
  Filled 2018-01-16: qty 1

## 2018-01-16 MED ORDER — SODIUM CHLORIDE 0.9 % IV SOLN
INTRAVENOUS | Status: DC | PRN
  Administered 2018-01-16: 250 mL via INTRAVENOUS

## 2018-01-16 MED ORDER — ONDANSETRON HCL 4 MG/2ML IJ SOLN
4.0000 mg | Freq: Four times a day (QID) | INTRAMUSCULAR | Status: DC | PRN
  Administered 2018-01-16 – 2018-01-17 (×2): 4 mg via INTRAVENOUS
  Filled 2018-01-16 (×3): qty 2

## 2018-01-16 NOTE — Progress Notes (Signed)
Pharmacy Antibiotic Note  Stanley [name] is a 57 y.o. male admitted on 01/15/2018 with pneumonia. Patient was transferred to ICU on 11/4 due to decreased level of responsiveness. Pharmacy has been consulted for cefepime and vancomycin dosing. Patient receives HD on T Th Sat. Patient did not receive HD today.   Plan: Patient did not receive HD today, but did get Vancomycin 750 mg IV dose. Will check random level prior to HD on 11/6. Goal trough 15-20.  Continue cefepime 1g IV Q24hr.   Height: 6\' 3"  (190.5 cm) Weight: 179 lb 14.3 oz (81.6 kg) IBW/kg (Calculated) : 84.5  Temp (24hrs), Avg:97.8 F (36.6 C), Min:97.7 F (36.5 C), Max:98.1 F (36.7 C)  Recent Labs  Lab 01/15/18 0647 01/15/18 0653 01/15/18 1056 01/15/18 1622 01/16/18 0635  WBC 16.4*  --   --   --  11.9*  CREATININE 9.81*  --   --  6.02* 7.53*  LATICACIDVEN  --  1.8 0.8  --   --     Estimated Creatinine Clearance: 12.3 mL/min (A) (by C-G formula based on SCr of 7.53 mg/dL (H)).    No Known Allergies  Antimicrobials this admission: 11/4 cefepime >>  11/4 vancomycin >>   Dose adjustments this admission: 11/4 Cefepime adjusted to 1 g q24h due to pt on HD.   Microbiology results:  11/4 MRSA PCR: (+).   Thank you for allowing pharmacy to be a part of this patient's care.   Charlett Nose, PharmD Pharmacy Resident  01/16/2018 5:20 PM

## 2018-01-16 NOTE — Progress Notes (Signed)
[facility], [place] 01/16/18  Subjective:   Patient still has a cough.  Diagnosed with right middle lobe infiltrate on chest x-ray.  Broad-spectrum antibiotics for pneumonia.  Also getting treatment for bronchospasm Later in the day, patient had decreased level of consciousness and was not following commands. Imaging (CT) negative. MRI pending.   Objective:  Vital signs in last 24 hours:  Temp:  [97.7 F (36.5 C)-99 F (37.2 C)] 97.7 F (36.5 C) (11/05 0800) Pulse Rate:  [79-95] 90 (11/05 0900) Resp:  [14-22] 20 (11/05 0900) BP: (105-152)/(62-97) 152/95 (11/05 0900) SpO2:  [89 %-100 %] 99 % (11/05 0900) Weight:  [75 kg-81.6 kg] 81.6 kg (11/04 1438)  Weight change: 2 kg Filed Weights   01/15/18 0953 01/15/18 1352 01/15/18 1438  Weight: 78 kg 75 kg 81.6 kg    Intake/Output:    Intake/Output Summary (Last 24 hours) at 01/16/2018 1103 Last data filed at 01/16/2018 0900 Gross per 24 hour  Intake 604.63 ml  Output 3049 ml  Net -2444.37 ml     Physical Exam: General:  Chronically ill-appearing, laying in the bed  HEENT  moist oral mucous membranes  Neck  distended neck veins  Pulm/lungs  bilateral crackles, nasal cannula oxygen  CVS/Heart  regular, no rub  Abdomen:   Severely distended, ascites  Extremities:  trace lower extremity edema bilaterally  Neurologic:  Alert and oriented  Access:  Right IJ PermCath       Basic Metabolic Panel:  Recent Labs  Lab 01/15/18 0647 01/15/18 1056 01/15/18 1622 01/16/18 0635  NA 141  --  139 140  K 6.1*  --  4.7 5.5*  CL 102  --  101 101  CO2 25  --  28 27  GLUCOSE 102*  --  203* 171*  BUN 57*  --  28* 42*  CREATININE 9.81*  --  6.02* 7.53*  CALCIUM 8.4*  --  8.3* 8.5*  PHOS  --  6.8*  --   --      CBC: Recent Labs  Lab 01/15/18 0647 01/16/18 0635  WBC 16.4* 11.9*  NEUTROABS 14.0*  --   HGB 10.0* 9.4*  HCT 35.7* 33.9*  MCV 87.7 88.1  PLT 346 317      Lab Results  Component  Value Date   HEPBSAG Negative 06/16/2017   HEPBSAB Reactive 03/01/2017   HEPBIGM Negative 06/01/2016      Microbiology:  Recent Results (from the past 240 hour(s))  MRSA PCR Screening     Status: Abnormal   Collection Time: 01/15/18  8:39 AM  Result Value Ref Range Status   MRSA by PCR POSITIVE (A) NEGATIVE Final    Comment:        The GeneXpert MRSA Assay (FDA approved for NASAL specimens only), is one component of a comprehensive MRSA colonization surveillance program. It is not intended to diagnose MRSA infection nor to guide or monitor treatment for MRSA infections. RESULT CALLED TO, READ BACK BY AND VERIFIED WITH: BETH BUONO 01/15/18 1905 REC Performed at [facility], [address] [place], [place] [address]   Culture, blood (Routine X 2) w Reflex to ID Panel     Status: None (Preliminary result)   Collection Time: 01/15/18 11:13 AM  Result Value Ref Range Status   Specimen Description BLOOD COLLECT BY DIALYSIS PORT  Final   Special Requests   Final    BOTTLES DRAWN AEROBIC AND ANAEROBIC Blood Culture results may not be optimal due to  an excessive volume of blood received in culture bottles   Culture   Final    NO GROWTH < 24 HOURS Performed at [facility], [address] [place], [place] [address]    Report Status PENDING  Incomplete  Culture, blood (Routine X 2) w Reflex to ID Panel     Status: None (Preliminary result)   Collection Time: 01/15/18 11:14 AM  Result Value Ref Range Status   Specimen Description BLOOD COLLECTED BY DIALYSIS PORT  Final   Special Requests   Final    BOTTLES DRAWN AEROBIC AND ANAEROBIC Blood Culture results may not be optimal due to an excessive volume of blood received in culture bottles   Culture   Final    NO GROWTH < 24 HOURS Performed at [facility], [address] [place], [place] [address]    Report Status PENDING  Incomplete    Coagulation Studies: No results for input(s): LABPROT, INR  in the last 72 hours.  Urinalysis: No results for input(s): COLORURINE, LABSPEC, PHURINE, GLUCOSEU, HGBUR, BILIRUBINUR, KETONESUR, PROTEINUR, UROBILINOGEN, NITRITE, LEUKOCYTESUR in the last 72 hours.  Invalid input(s): APPERANCEUR    Imaging: Dg Chest 2 View  Result Date: 01/16/2018 CLINICAL DATA:  Pneumonia. EXAM: CHEST - 2 VIEW COMPARISON:  Radiograph of January 15, 2018. FINDINGS: Stable cardiomegaly. Atherosclerosis of thoracic aorta is noted. Sternotomy wires are noted. Right internal jugular dialysis catheter is unchanged in position. No pneumothorax or pleural effusion is noted. No consolidative process is seen currently. Bony thorax is unremarkable. IMPRESSION: No acute consolidative process is noted. Aortic Atherosclerosis (ICD10-I70.0). Electronically Signed   By: Marijo Conception, M.D.   On: 01/16/2018 07:59   Ct Head Wo Contrast  Result Date: 01/15/2018 CLINICAL DATA:  57 y/o M; episode of altered mental status while eating dinner. EXAM: CT HEAD WITHOUT CONTRAST TECHNIQUE: Contiguous axial images were obtained from the base of the skull through the vertex without intravenous contrast. COMPARISON:  07/09/2013 CT head FINDINGS: Brain: No evidence of acute infarction, hemorrhage, hydrocephalus, extra-axial collection or mass lesion/mass effect. Stable chronic microvascular ischemic changes and volume loss of the brain. Stable small chronic lacunar infarcts within the right caudate head, right putamen, and left anterior limb of internal capsule. Vascular: Calcific atherosclerosis of the carotid siphons. No hyperdense vessel identified. Skull: Normal. Negative for fracture or focal lesion. Sinuses/Orbits: No acute finding. Other: None. IMPRESSION: 1. No acute intracranial abnormality identified. 2. Stable chronic microvascular ischemic changes, brain volume loss, and chronic lacunar infarcts of basal ganglia. Electronically Signed   By: Kristine Garbe M.D.   On: 01/15/2018 18:23   Dg  Chest Port 1 View  Result Date: 01/15/2018 CLINICAL DATA:  Respiratory distress. Dialysis patient. Shortness of breath. EXAM: PORTABLE CHEST 1 VIEW COMPARISON:  12/26/2017 FINDINGS: Sternotomy wires and right IJ dialysis catheter unchanged. Lungs are adequately inflated with minimal stable prominence of the perihilar markings likely mild vascular congestion. There is patchy density over the right mid to lower lung which may be due to atelectasis or infection. No effusion. Mild stable cardiomegaly. Remainder the exam is unchanged. IMPRESSION: Patchy density over the right mid to lower lung which may be due to atelectasis or infection. Mild cardiomegaly with mild stable vascular congestion. Electronically Signed   By: Marin Olp M.D.   On: 01/15/2018 07:14     Medications:   . sodium chloride 10 mL/hr at 01/16/18 0900  . ceFEPime (MAXIPIME) IV Stopped (01/16/18 3151)  . vancomycin     .  calcium acetate  2,001 mg Oral TID  . Chlorhexidine Gluconate Cloth  6 each Topical Q0600  . epoetin (EPOGEN/PROCRIT) injection  4,000 Units Intravenous Q M,W,F-HD  . ferrous sulfate  325 mg Oral TID  . folic acid  1 mg Oral Daily  . gabapentin  300 mg Oral BID  . heparin  5,000 Units Subcutaneous Q8H  . labetalol  100 mg Oral Daily  . methylPREDNISolone (SOLU-MEDROL) injection  60 mg Intravenous Q6H  . mometasone-formoterol  2 puff Inhalation BID  . multivitamin  1 tablet Oral QHS  . mupirocin ointment  1 application Nasal BID  . nicotine  21 mg Transdermal Daily  . pantoprazole  40 mg Oral Daily  . tiotropium  18 mcg Inhalation Daily     Assessment/ Plan:  57 y.o. 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  Hyperkalemia 2.  Volume overload  with acute pulmonary edema 3.  Tense ascites 4.  End-stage renal disease 5.  Anemia chronic kidney disease 6.  Low-grade fever, elevated WBC  Plan: Urgent hemodialysis on low K bath Rebound hyperkalemia- repeat HD today Volume removal with hemodialysis Antibiotics as per primary team Monitor Phos     LOS: 1 Danie Hannig [name] 11/5/201911:03 AM  [place], [place]  Note: This note was prepared with Dragon dictation. Any transcription errors are unintentional

## 2018-01-16 NOTE — H&P (Signed)
[facility] NAME: Stanley [name]    MR#:  [id number]  DATE OF BIRTH:  22-Aug-1960  DATE OF ADMISSION:  01/15/2018  PRIMARY CARE PHYSICIAN: Alene Mires Elyse Jarvis, MD   REQUESTING/REFERRING PHYSICIAN: Owens Shark  CHIEF COMPLAINT:   Chief Complaint  Patient presents with  . Respiratory Distress    HISTORY OF PRESENT ILLNESS: Stanley [name]  is a 57 y.o. male with a known history of alcohol abuse, congestive heart failure, cirrhosis, coronary artery disease, drug abuse, end-stage renal disease, noncompliant-had last hemodialysis done Saturday, started to feel short of breath so came to emergency room.  Noted to have hypoxia and some infiltrates on the lung and started on BiPAP. Given to hospitalist team for further management.  PAST MEDICAL HISTORY:   Past Medical History:  Diagnosis Date  . Alcohol abuse   . CHF (congestive heart failure) ([place])   . Cirrhosis ([place])   . Coronary artery disease 2009  . Drug abuse ([place])   . End stage renal disease on dialysis [facility]) NEPHROLOGIST-   DR [facility]  IN [place]   HEMODIALYSIS --   TUES/  THURS/  SAT  . Gastrointestinal bleed 06/13/2017   From chart...hx of multiple GI bleeds  . GERD (gastroesophageal reflux disease)   . Hyperlipidemia   . Hypertension   . PAD (peripheral artery disease) ([place])   . Renal insufficiency    Per pt, 32 oz fluid restriction per day  . S/P triple vessel bypass 06/09/2016   2009ish  . Suicidal ideation    & HOMICIDAL IDEATION --  06-16-2013   ADMITTED TO BEHAVIOR HEALTH    PAST SURGICAL HISTORY:  Past Surgical History:  Procedure Laterality Date  . A/V FISTULAGRAM Right 06/06/2017   Procedure: A/V FISTULAGRAM;  Surgeon: Katha Cabal, MD;  Location: [place] CV LAB;  Service: Cardiovascular;  Laterality: Right;  . A/V SHUNT INTERVENTION N/A 06/06/2017   Procedure: A/V SHUNT INTERVENTION;  Surgeon: Katha Cabal, MD;  Location: [place] CV  LAB;  Service: Cardiovascular;  Laterality: N/A;  . AGILE CAPSULE N/A 06/19/2016   Procedure: AGILE CAPSULE;  Surgeon: [name], MD;  Location: [facility] ENDOSCOPY;  Service: Endoscopy;  Laterality: N/A;  . COLONOSCOPY WITH PROPOFOL N/A 06/18/2016   Procedure: COLONOSCOPY WITH PROPOFOL;  Surgeon: [name], MD;  Location: [facility] ENDOSCOPY;  Service: Endoscopy;  Laterality: N/A;  . COLONOSCOPY WITH PROPOFOL N/A 08/12/2016   Procedure: COLONOSCOPY WITH PROPOFOL;  Surgeon: [name], MD;  Location[facility] ENDOSCOPY;  Service: Endoscopy;  Laterality: N/A;  . COLONOSCOPY WITH PROPOFOL N/A 05/05/2017   Procedure: COLONOSCOPY WITH PROPOFOL;  Surgeon: Manya Silvas, MD;  Location: [facility] ENDOSCOPY;  Service: Endoscopy;  Laterality: N/A;  . CORONARY ANGIOPLASTY  ?   PT UNABLE TO TELL IF  BEFORE OR AFTER  CABG  . CORONARY ARTERY BYPASS GRAFT  2008  ([place])   3 VESSEL  . DIALYSIS FISTULA CREATION  LAST SURGERY  APPOX  2008  . ENTEROSCOPY N/A 05/10/2016   Procedure: ENTEROSCOPY;  Surgeon: Jerene Bears, MD;  Location: [place];  Service: Gastroenterology;  Laterality: N/A;  . ENTEROSCOPY N/A 08/12/2016   Procedure: ENTEROSCOPY;  Surgeon: [name], MD;  Location: [facility] ENDOSCOPY;  Service: Endoscopy;  Laterality: N/A;  . ENTEROSCOPY Left 06/03/2017   Procedure: ENTEROSCOPY;  Surgeon: Virgel Manifold, MD;  Location: [facility] ENDOSCOPY;  Service: Endoscopy;  Laterality: Left;  Procedure date will ultimately depend on when patient is medically  optimized before the procedure, pending hemodialysis and blood transfusions etc. Will place on schedule and change depending on clinical status.   . ENTEROSCOPY N/A 06/05/2017   Procedure: ENTEROSCOPY;  Surgeon: Virgel Manifold, MD;  Location: [facility] ENDOSCOPY;  Service: Endoscopy;  Laterality: N/A;  . ENTEROSCOPY N/A 06/15/2017   Procedure: Push ENTEROSCOPY;  Surgeon: [name], MD;  Location: [facility] ENDOSCOPY;  Service: Endoscopy;  Laterality: N/A;  .  ESOPHAGOGASTRODUODENOSCOPY N/A 05/07/2015   Procedure: ESOPHAGOGASTRODUODENOSCOPY (EGD);  Surgeon: Hulen Luster, MD;  Location: [facility] ENDOSCOPY;  Service: Endoscopy;  Laterality: N/A;  . ESOPHAGOGASTRODUODENOSCOPY (EGD) WITH PROPOFOL N/A 05/17/2015   Procedure: ESOPHAGOGASTRODUODENOSCOPY (EGD) WITH PROPOFOL;  Surgeon: [name], MD;  Location: [facility] ENDOSCOPY;  Service: Endoscopy;  Laterality: N/A;  . ESOPHAGOGASTRODUODENOSCOPY (EGD) WITH PROPOFOL N/A 01/20/2016   Procedure: ESOPHAGOGASTRODUODENOSCOPY (EGD) WITH PROPOFOL;  Surgeon: [name], MD;  Location: [facility] ENDOSCOPY;  Service: Endoscopy;  Laterality: N/A;  . ESOPHAGOGASTRODUODENOSCOPY (EGD) WITH PROPOFOL N/A 04/17/2016   Procedure: ESOPHAGOGASTRODUODENOSCOPY (EGD) WITH PROPOFOL;  Surgeon: Lin Landsman, MD;  Location: [facility] ENDOSCOPY;  Service: Gastroenterology;  Laterality: N/A;  . ESOPHAGOGASTRODUODENOSCOPY (EGD) WITH PROPOFOL  05/09/2016   Procedure: ESOPHAGOGASTRODUODENOSCOPY (EGD) WITH PROPOFOL;  Surgeon: Jerene Bears, MD;  Location: [place];  Service: Endoscopy;;  . ESOPHAGOGASTRODUODENOSCOPY (EGD) WITH PROPOFOL N/A 06/16/2016   Procedure: ESOPHAGOGASTRODUODENOSCOPY (EGD) WITH PROPOFOL;  Surgeon: [name], MD;  Location: [facility] ENDOSCOPY;  Service: Endoscopy;  Laterality: N/A;  . ESOPHAGOGASTRODUODENOSCOPY (EGD) WITH PROPOFOL N/A 05/05/2017   Procedure: ESOPHAGOGASTRODUODENOSCOPY (EGD) WITH PROPOFOL;  Surgeon: Manya Silvas, MD;  Location: [facility] ENDOSCOPY;  Service: Endoscopy;  Laterality: N/A;  . ESOPHAGOGASTRODUODENOSCOPY (EGD) WITH PROPOFOL N/A 06/15/2017   Procedure: ESOPHAGOGASTRODUODENOSCOPY (EGD) WITH PROPOFOL;  Surgeon: [name], MD;  Location: [facility] ENDOSCOPY;  Service: Endoscopy;  Laterality: N/A;  . GIVENS CAPSULE STUDY N/A 05/07/2016   Procedure: GIVENS CAPSULE STUDY;  Surgeon: Doran Stabler, MD;  Location: [place];  Service: Endoscopy;  Laterality: N/A;  . MANDIBULAR HARDWARE REMOVAL N/A 07/29/2013   Procedure:  REMOVAL OF ARCH BARS;  Surgeon: Theodoro Kos, DO;  Location: [place];  Service: Plastics;  Laterality: N/A;  . ORIF MANDIBULAR FRACTURE N/A 06/05/2013   Procedure: REPAIR OF MANDIBULAR FRACTURE x 2 with maxillo-mandibular fixation ;  Surgeon: Theodoro Kos, DO;  Location: [place];  Service: Plastics;  Laterality: N/A;  . PARACENTESIS    . PERIPHERAL ARTERIAL STENT GRAFT Left     SOCIAL HISTORY:  Social History   Tobacco Use  . Smoking status: Current Every Day Smoker    Packs/day: 0.15    Years: 40.00    Pack years: 6.00    Types: Cigarettes  . Smokeless tobacco: Never Used  Substance Use Topics  . Alcohol use: No    Comment: pt reports quitting after learning about cirrhosis    FAMILY HISTORY:  Family History  Problem Relation Age of Onset  . Colon cancer Mother   . Cancer Father   . Cancer Sister   . Kidney disease Brother     DRUG ALLERGIES: No Known Allergies  REVIEW OF SYSTEMS:   CONSTITUTIONAL: No fever, fatigue or weakness.  Has some chills. EYES: No blurred or double vision.  EARS, NOSE, AND THROAT: No tinnitus or ear pain.  RESPIRATORY: No cough, have shortness of breath, no wheezing or hemoptysis.  CARDIOVASCULAR: No chest pain, orthopnea, edema.  GASTROINTESTINAL: No nausea, vomiting, diarrhea or abdominal pain.  GENITOURINARY: No dysuria, hematuria.  ENDOCRINE: No polyuria, nocturia,  HEMATOLOGY: No anemia, easy bruising or bleeding SKIN: No rash or lesion. MUSCULOSKELETAL: No joint pain or arthritis.   NEUROLOGIC: No tingling, numbness, weakness.  PSYCHIATRY: No anxiety or depression.   MEDICATIONS AT HOME:  Prior to Admission medications   Medication Sig Start Date End Date Taking? Authorizing Provider  budesonide-formoterol (SYMBICORT) 160-4.5 MCG/ACT inhaler Inhale 2 puffs into the lungs daily. 11/16/16  Yes Vaughan Basta, MD  calcium acetate (PHOSLO) 667 MG capsule Take 2,001 mg by mouth 3 (three) times daily.   Yes  [provider]  folic acid (FOLVITE) 1 MG tablet Take 1 tablet (1 mg total) by mouth daily. 10/31/17  Yes Fritzi Mandes, MD  gabapentin (NEURONTIN) 300 MG capsule Take 300 mg by mouth 3 (three) times daily.  08/16/16  Yes [provider]  multivitamin (RENA-VIT) TABS tablet Take 1 tablet by mouth at bedtime. 10/31/17  Yes Fritzi Mandes, MD  nitroGLYCERIN (NITROSTAT) 0.4 MG SL tablet Place 1 tablet (0.4 mg total) under the tongue every 5 (five) minutes as needed. 04/12/16  Yes Wende Bushy, MD  omeprazole (PRILOSEC) 40 MG capsule Take 40 mg by mouth daily. 09/08/17  Yes [provider]  Spacer/Aero Chamber Mouthpiece MISC 1 Units by Does not apply route every 4 (four) hours as needed (wheezing). 02/19/17  Yes Darel Hong, MD  SPIRIVA HANDIHALER 18 MCG inhalation capsule Place 1 capsule into inhaler and inhale daily. 01/04/18  Yes [provider]  ferrous sulfate 325 (65 FE) MG tablet Take 1 tablet by mouth 3 (three) times daily. 01/21/17   [provider]  ipratropium-albuterol (DUONEB) 0.5-2.5 (3) MG/3ML SOLN Take 3 mLs by nebulization every 6 (six) hours as needed. 11/28/16   Loletha Grayer, MD  labetalol (NORMODYNE) 100 MG tablet Take 100 mg by mouth daily. 03/24/17   [provider]  tiotropium (SPIRIVA HANDIHALER) 18 MCG inhalation capsule Place 1 capsule (18 mcg total) into inhaler and inhale daily. 11/16/16 12/23/17  Vaughan Basta, MD      PHYSICAL EXAMINATION:   VITAL SIGNS: Blood pressure (!) 132/97, pulse 87, temperature 97.7 F (36.5 C), temperature source Axillary, resp. rate 19, height 6\' 3"  (1.905 m), weight 81.6 kg, SpO2 98 %.  GENERAL:  57 y.o.-year-old patient lying in the bed with no acute distress.  EYES: Pupils equal, round, reactive to light and accommodation. No scleral icterus. Extraocular muscles intact.  HEENT: Head atraumatic, normocephalic. Oropharynx and nasopharynx clear.  NECK:  Supple, no jugular venous  distention. No thyroid enlargement, no tenderness.  LUNGS: Normal breath sounds bilaterally, no wheezing, have crepitation. No use of accessory muscles of respiration.  On BiPAP use during my visit. CARDIOVASCULAR: S1, S2 normal. No murmurs, rubs, or gallops.  ABDOMEN: Soft, nontender, distended. Bowel sounds present. No organomegaly or mass.  EXTREMITIES: No pedal edema, cyanosis, or clubbing.  NEUROLOGIC: Cranial nerves II through XII are intact. Muscle strength 4/5 in all extremities. Sensation intact. Gait not checked.  PSYCHIATRIC: The patient is alert and oriented x 3.  SKIN: No obvious rash, lesion, or ulcer.   LABORATORY PANEL:   CBC Recent Labs  Lab 01/15/18 0647 01/16/18 0635  WBC 16.4* 11.9*  HGB 10.0* 9.4*  HCT 35.7* 33.9*  PLT 346 317  MCV 87.7 88.1  MCH 24.6* 24.4*  MCHC 28.0* 27.7*  RDW 19.3* 19.3*  LYMPHSABS 0.7  --   MONOABS 1.5*  --   EOSABS 0.0  --   BASOSABS 0.1  --    ------------------------------------------------------------------------------------------------------------------  Chemistries  Recent Labs  Lab 01/15/18 0647 01/15/18 1622 01/16/18 0635  NA 141 139 140  K 6.1* 4.7 5.5*  CL 102 101 101  CO2 25 28 27   GLUCOSE 102* 203* 171*  BUN 57* 28* 42*  CREATININE 9.81* 6.02* 7.53*  CALCIUM 8.4* 8.3* 8.5*   ------------------------------------------------------------------------------------------------------------------ estimated creatinine clearance is 12.3 mL/min (A) (by C-G formula based on SCr of 7.53 mg/dL (H)). ------------------------------------------------------------------------------------------------------------------ No results for input(s): TSH, T4TOTAL, T3FREE, THYROIDAB in the last 72 hours.  Invalid input(s): FREET3   Coagulation profile No results for input(s): INR, PROTIME in the last 168 hours. ------------------------------------------------------------------------------------------------------------------- No  results for input(s): DDIMER in the last 72 hours. -------------------------------------------------------------------------------------------------------------------  Cardiac Enzymes Recent Labs  Lab 01/15/18 0647  TROPONINI 0.11*   ------------------------------------------------------------------------------------------------------------------ Invalid input(s): POCBNP  ---------------------------------------------------------------------------------------------------------------  Urinalysis    Component Value Date/Time   COLORURINE YELLOW (A) 05/03/2015 0815   APPEARANCEUR CLEAR (A) 05/03/2015 0815   APPEARANCEUR Clear 11/13/2013 1314   LABSPEC 1.008 05/03/2015 0815   LABSPEC 1.014 11/13/2013 1314   PHURINE 9.0 (H) 05/03/2015 0815   GLUCOSEU NEGATIVE 05/03/2015 0815   GLUCOSEU 50 mg/dL 11/13/2013 1314   HGBUR NEGATIVE 05/03/2015 0815   BILIRUBINUR NEGATIVE 05/03/2015 0815   BILIRUBINUR Negative 11/13/2013 1314   KETONESUR NEGATIVE 05/03/2015 0815   PROTEINUR 100 (A) 05/03/2015 0815   UROBILINOGEN 0.2 06/22/2014 1230   NITRITE NEGATIVE 05/03/2015 0815   LEUKOCYTESUR NEGATIVE 05/03/2015 0815   LEUKOCYTESUR Negative 11/13/2013 1314     RADIOLOGY: Dg Eye Foreign Body  Result Date: 01/16/2018 CLINICAL DATA:  Metal working/exposure; clearance prior to MRI EXAM: ORBITS FOR FOREIGN BODY - 2 VIEW COMPARISON:  None. FINDINGS: There is no evidence of metallic foreign body within the orbits. No significant bone abnormality identified. The observed paranasal sinuses are clear. IMPRESSION: No evidence of metallic foreign body within the orbits. Electronically Signed   By: David  Martinique M.D.   On: 01/16/2018 13:38   Dg Chest 2 View  Result Date: 01/16/2018 CLINICAL DATA:  Pneumonia. EXAM: CHEST - 2 VIEW COMPARISON:  Radiograph of January 15, 2018. FINDINGS: Stable cardiomegaly. Atherosclerosis of thoracic aorta is noted. Sternotomy wires are noted. Right internal jugular dialysis  catheter is unchanged in position. No pneumothorax or pleural effusion is noted. No consolidative process is seen currently. Bony thorax is unremarkable. IMPRESSION: No acute consolidative process is noted. Aortic Atherosclerosis (ICD10-I70.0). Electronically Signed   By: Marijo Conception, M.D.   On: 01/16/2018 07:59   Ct Head Wo Contrast  Result Date: 01/15/2018 CLINICAL DATA:  57 y/o M; episode of altered mental status while eating dinner. EXAM: CT HEAD WITHOUT CONTRAST TECHNIQUE: Contiguous axial images were obtained from the base of the skull through the vertex without intravenous contrast. COMPARISON:  07/09/2013 CT head FINDINGS: Brain: No evidence of acute infarction, hemorrhage, hydrocephalus, extra-axial collection or mass lesion/mass effect. Stable chronic microvascular ischemic changes and volume loss of the brain. Stable small chronic lacunar infarcts within the right caudate head, right putamen, and left anterior limb of internal capsule. Vascular: Calcific atherosclerosis of the carotid siphons. No hyperdense vessel identified. Skull: Normal. Negative for fracture or focal lesion. Sinuses/Orbits: No acute finding. Other: None. IMPRESSION: 1. No acute intracranial abnormality identified. 2. Stable chronic microvascular ischemic changes, brain volume loss, and chronic lacunar infarcts of basal ganglia. Electronically Signed   By: Kristine Garbe M.D.   On: 01/15/2018 18:23   Dg Chest Port 1 View  Result Date: 01/15/2018 CLINICAL DATA:  Respiratory distress. Dialysis patient. Shortness of breath. EXAM:  PORTABLE CHEST 1 VIEW COMPARISON:  12/26/2017 FINDINGS: Sternotomy wires and right IJ dialysis catheter unchanged. Lungs are adequately inflated with minimal stable prominence of the perihilar markings likely mild vascular congestion. There is patchy density over the right mid to lower lung which may be due to atelectasis or infection. No effusion. Mild stable cardiomegaly. Remainder the  exam is unchanged. IMPRESSION: Patchy density over the right mid to lower lung which may be due to atelectasis or infection. Mild cardiomegaly with mild stable vascular congestion. Electronically Signed   By: Marin Olp M.D.   On: 01/15/2018 07:14    EKG: Orders placed or performed during the hospital encounter of 01/15/18  . ED EKG  . ED EKG  . EKG 12-Lead  . EKG 12-Lead  . EKG    IMPRESSION AND PLAN:  *Acute on chronic respiratory failure with hypoxia Secondary to pulmonary edema due to noncompliance with hemodialysis Also healthcare associated pneumonia  Continue BiPAP, admit to stepdown unit. IV broad-spectrum antibiotics for now. ICU physician is contacted for further management and nephrologist is contacted for the need of hemodialysis.  *Sepsis Healthcare associated pneumonia  Continue broad-spectrum antibiotics, lactic acid, procalcitonin, blood cultures are sent.  *COPD exacerbation IV steroid and nebulizer treatment Supplemental oxygen and BiPAP as above.  *Acute on chronic diastolic congestive heart failure Due to pulmonary edema and noncompliance to dialysis Hemodialysis needed, nephrologist is aware.  *[name] Hyperkalemia Need urgent hemodialysis, informed nephrologist.  *Chronic anemia due to renal failure Continue to monitor.  *Liver cirrhosis and ascites Once more stable, may need a sciatic tap.  *Active smoking Counseled to quit smoking for 4 minutes and offered nicotine patch.    All the records are reviewed and case discussed with ED provider. Management plans discussed with the patient, family and they are in agreement.  CODE STATUS: full.    Code Status Orders  (From admission, onward)         Start     Ordered   01/15/18 1438  Full code  Continuous     01/15/18 1437        Code Status History    Date Active Date Inactive Code Status Order ID Comments User Context   12/23/2017 2225 12/26/2017  1846 Full Code [id number]  Amelia Jo, MD Inpatient   12/09/2017 1043 12/11/2017 1617 Full Code [id number]  Epifanio Lesches, MD ED   11/27/2017 1122 11/28/2017 1827 Full Code [id number]  Demetrios Loll, MD Inpatient   11/18/2017 2008 11/20/2017 1853 Full Code [id number]  Saundra Shelling, MD Inpatient   10/30/2017 0007 10/31/2017 1724 Full Code [id number]  Amelia Jo, MD Inpatient   06/12/2017 1707 06/16/2017 1704 Full Code [id number]  Nicholes Mango, MD Inpatient   06/02/2017 1603 06/06/2017 2222 Full Code [id number]  Demetrios Loll, MD Inpatient   05/24/2017 1831 05/25/2017 1921 Full Code [id number]  Saundra Shelling, MD ED   05/03/2017 1331 05/05/2017 1806 Full Code [id number]  Bettey Costa, MD Inpatient   04/01/2017 0947 04/03/2017 1156 Full Code [id number]  Loletha Grayer, MD ED   03/09/2017 0010 03/09/2017 1928 Full Code [id number]  Lance Coon, MD Inpatient   03/03/2017 2027 03/04/2017 1920 Full Code [id number]  Demetrios Loll, MD Inpatient   02/28/2017 2254 03/02/2017 1519 Full Code [id number]  Lance Coon, MD Inpatient   02/22/2017 1721 02/24/2017 1359 Full Code [id number]  Dustin Flock, MD Inpatient   01/22/2017 2337 01/25/2017 1522 Full Code [id number]  Gorden Harms, MD Inpatient   01/07/2017 1445 01/13/2017 1812  Full Code [id number]  Idelle Crouch, MD Inpatient   12/22/2016 1431 12/24/2016 1804 Full Code [id number]  Hillary Bow, MD ED   12/20/2016 2020 12/21/2016 1853 Full Code [id number]  Demetrios Loll, MD Inpatient   11/26/2016 1030 11/28/2016 1522 Full Code [id number]  Henreitta Leber, MD Inpatient   11/15/2016 1930 11/16/2016 1822 Full Code [id number]  Fritzi Mandes, MD Inpatient   10/29/2016 1453 10/30/2016 1506 Full Code [id number]  Loletha Grayer, MD ED   09/15/2016 1757 09/16/2016 1817 Full Code [id number]  Dustin Flock, MD ED   08/16/2016 0945 08/18/2016 1816 Full Code [id number]  Hillary Bow, MD ED   08/09/2016 1659 08/12/2016 2034 Full Code [id number]  Fritzi Mandes, MD Inpatient   07/03/2016 2154 07/05/2016 2122 Full Code  [id number]  Demetrios Loll, MD Inpatient   06/24/2016 0423 06/26/2016 1546 Full Code [id number]  Saundra Shelling, MD Inpatient   06/14/2016 1453 06/19/2016 2104 Full Code [id number]  Hillary Bow, MD ED   05/31/2016 0642 06/01/2016 2130 Full Code [id number]  Saundra Shelling, MD Inpatient   05/05/2016 2309 05/10/2016 2157 Full Code [id number]  Vianne Bulls, MD Inpatient   04/16/2016 0356 04/17/2016 1827 Full Code [id number]  Saundra Shelling, MD Inpatient   01/19/2016 1513 01/22/2016 1414 Full Code [id number]  Loletha Grayer, MD ED   09/24/2015 1033 09/25/2015 1331 Full Code [id number]  Loletha Grayer, MD ED   05/16/2015 1417 05/17/2015 2057 Full Code [id number]  Idelle Crouch, MD Inpatient   04/30/2015 2008 05/08/2015 1940 Full Code [id number]  Vaughan Basta, MD Inpatient   04/06/2015 0507 04/07/2015 1736 Full Code [id number]  Lance Coon, MD Inpatient   03/23/2015 1107 03/27/2015 1246 Full Code [id number]  Bettey Costa, MD Inpatient   06/19/2013 1132 06/21/2013 1749 Full Code [id number]  Cristal Ford, DO Inpatient   06/16/2013 1726 06/19/2013 1132 Full Code [id number]  Margarita Mail, PA-C ED   06/05/2013 1533 06/06/2013 1856 Full Code [id number]  Theodoro Kos, DO Inpatient   06/02/2013 0901 06/05/2013 1533 Full Code [id number]  Velvet Bathe, MD Inpatient   06/02/2013 0840 06/02/2013 0901 Full Code [id number]  Velvet Bathe, MD Inpatient       TOTAL TIME TAKING CARE OF THIS PATIENT: 45 minutes.    Vaughan Basta M.D on 01/16/2018   Between 7am to 6pm - Pager - [phone]  After 6pm go to [email or web address] - password [email or web address] [facility]  Office  [phone]  CC: Primary care physician; [name], MD   Note: This dictation was prepared with Dragon dictation along with smaller phrase technology. Any transcriptional errors that result from this process are unintentional.

## 2018-01-16 NOTE — Procedures (Signed)
ELECTROENCEPHALOGRAM REPORT   Patient: Stanley [name]       Room #: IC17A-AA EEG No. ID: [id number] Age: 57 y.o.        Sex: male Referring Physician: Anselm Jungling Report Date:  01/16/2018        Interpreting Physician: Alexis Goodell  History: Tyberius Ryner is an 57 y.o. male with altered mental status  Medications:  Phoslo, Cefepime, Epogen, Ferrous sulfate, Folvite, Labetalol, Solumedrol, Dulera, Protonix, Spiriva, Vancomycin  Conditions of Recording:  This is a 21 channel routine scalp EEG performed with bipolar and monopolar montages arranged in accordance to the international 10/20 system of electrode placement. One channel was dedicated to EKG recording.  The patient is in the awake and drowsy states.  Description:  The waking background activity consists of a low voltage, symmetrical, fairly well organized, 6-7 Hz theta activity, seen from the parieto-occipital and posterior temporal regions.  Low voltage fast activity, poorly organized, is seen anteriorly and is at times superimposed on more posterior regions.  A mixture of theta and alpha rhythms are seen from the central and temporal regions. The patient drowses with slowing to irregular, low voltage theta and beta activity.   Stage II sleep is not obtained. No epileptiform activity is noted.   Hyperventilation and intermittent photic stimulation were not performed.   IMPRESSION: This EEG is characterized by slowing which is consistent with normal drowse.  Can not rule out the possibility of slowing related to general cerebral disturbance such as a metabolic encephalopathy.  Clinical correlation recommended.  No epileptiform activity is noted.       Alexis Goodell, MD Neurology [phone] 01/16/2018, 2:50 PM

## 2018-01-16 NOTE — Consult Note (Signed)
Referring Physician:Maier, Levada Dy, MD    Chief Complaint: Altered mental status  HPI: Stanley [name] is an 57 y.o. male pertinent history of end-stage renal disease on hemodialysis, drug abuse, alcohol abuse, liver cirrhosis, coronary artery disease, hypertension, hyperlipidemia who initially presented to the ED on 01/15/2018 with chief complaint of shortness of breath and respiratory distress.  Patient states that she goes to dialysis Tuesday Thursday and Saturday, however he missed dialysis on Thursday. On arrival to the ED he was noted to be in acute respiratory distress so Bipap was applied. Initial labs revealed elevated white count of 16.4, decreased H&H, kidney functions at baseline, with a troponins of 0.11 lactic acid of 1.8, positive MRSA, elevated potassium of 6.1 now improved to 4.7, abnormal blood gas showing compensated hypercapnia.  Due to decreased level of consciousness patient had CT head which did not show acute intracranial abnormality.  He was therefore transferred to the ICU for emergent hemodialysis and further monitoring.  Patient currently remains lethargic however he is responsive and following commands with notable shortness of breath on exertion.  Patient is also complaining of right facial numbness and right side weakness.  He reports that he bit his tongue about 2 days ago in his sleep. Initial NIH stroke scale 4.  Date last known well: Unable to determine Time last known well: Unable to determine tPA Given: No: Unable to determine date and time last known well.  Past Medical History:  Diagnosis Date  . Alcohol abuse   . CHF (congestive heart failure) ([place])   . Cirrhosis ([place])   . Coronary artery disease 2009  . Drug abuse ([place])   . End stage renal disease on dialysis [facility]) NEPHROLOGIST-   DR [facility]  IN [place]   HEMODIALYSIS --   TUES/  THURS/  SAT  . Gastrointestinal bleed 06/13/2017   From chart...hx of multiple GI bleeds  . GERD (gastroesophageal reflux  disease)   . Hyperlipidemia   . Hypertension   . PAD (peripheral artery disease) ([place])   . Renal insufficiency    Per pt, 32 oz fluid restriction per day  . S/P triple vessel bypass 06/09/2016   2009ish  . Suicidal ideation    & HOMICIDAL IDEATION --  06-16-2013   ADMITTED TO BEHAVIOR HEALTH    Past Surgical History:  Procedure Laterality Date  . A/V FISTULAGRAM Right 06/06/2017   Procedure: A/V FISTULAGRAM;  Surgeon: Katha Cabal, MD;  Location: [place] CV LAB;  Service: Cardiovascular;  Laterality: Right;  . A/V SHUNT INTERVENTION N/A 06/06/2017   Procedure: A/V SHUNT INTERVENTION;  Surgeon: Katha Cabal, MD;  Location: [place] CV LAB;  Service: Cardiovascular;  Laterality: N/A;  . AGILE CAPSULE N/A 06/19/2016   Procedure: AGILE CAPSULE;  Surgeon: [name], MD;  Location: [facility] ENDOSCOPY;  Service: Endoscopy;  Laterality: N/A;  . COLONOSCOPY WITH PROPOFOL N/A 06/18/2016   Procedure: COLONOSCOPY WITH PROPOFOL;  Surgeon: [name], MD;  Location: [facility] ENDOSCOPY;  Service: Endoscopy;  Laterality: N/A;  . COLONOSCOPY WITH PROPOFOL N/A 08/12/2016   Procedure: COLONOSCOPY WITH PROPOFOL;  Surgeon: [name], MD;  Location: [facility] ENDOSCOPY;  Service: Endoscopy;  Laterality: N/A;  . COLONOSCOPY WITH PROPOFOL N/A 05/05/2017   Procedure: COLONOSCOPY WITH PROPOFOL;  Surgeon: Manya Silvas, MD;  Location: [facility] ENDOSCOPY;  Service: Endoscopy;  Laterality: N/A;  . CORONARY ANGIOPLASTY  ?   PT UNABLE TO TELL IF  BEFORE OR AFTER  CABG  . CORONARY ARTERY BYPASS GRAFT  2008  (  [place] , [place])   3 VESSEL  . DIALYSIS FISTULA CREATION  LAST SURGERY  APPOX  2008  . ENTEROSCOPY N/A 05/10/2016   Procedure: ENTEROSCOPY;  Surgeon: Jerene Bears, MD;  Location: [place];  Service: Gastroenterology;  Laterality: N/A;  . ENTEROSCOPY N/A 08/12/2016   Procedure: ENTEROSCOPY;  Surgeon: [name], MD;  Location: [facility] ENDOSCOPY;  Service: Endoscopy;  Laterality: N/A;  . ENTEROSCOPY Left  06/03/2017   Procedure: ENTEROSCOPY;  Surgeon: Virgel Manifold, MD;  Location: [facility] ENDOSCOPY;  Service: Endoscopy;  Laterality: Left;  Procedure date will ultimately depend on when patient is medically optimized before the procedure, pending hemodialysis and blood transfusions etc. Will place on schedule and change depending on clinical status.   . ENTEROSCOPY N/A 06/05/2017   Procedure: ENTEROSCOPY;  Surgeon: Virgel Manifold, MD;  Location: [facility] ENDOSCOPY;  Service: Endoscopy;  Laterality: N/A;  . ENTEROSCOPY N/A 06/15/2017   Procedure: Push ENTEROSCOPY;  Surgeon: [name], MD;  Location: [facility] ENDOSCOPY;  Service: Endoscopy;  Laterality: N/A;  . ESOPHAGOGASTRODUODENOSCOPY N/A 05/07/2015   Procedure: ESOPHAGOGASTRODUODENOSCOPY (EGD);  Surgeon: Hulen Luster, MD;  Location: [facility] ENDOSCOPY;  Service: Endoscopy;  Laterality: N/A;  . ESOPHAGOGASTRODUODENOSCOPY (EGD) WITH PROPOFOL N/A 05/17/2015   Procedure: ESOPHAGOGASTRODUODENOSCOPY (EGD) WITH PROPOFOL;  Surgeon: [name], MD;  Location: [facility] ENDOSCOPY;  Service: Endoscopy;  Laterality: N/A;  . ESOPHAGOGASTRODUODENOSCOPY (EGD) WITH PROPOFOL N/A 01/20/2016   Procedure: ESOPHAGOGASTRODUODENOSCOPY (EGD) WITH PROPOFOL;  Surgeon: [name], MD;  Location: [facility] ENDOSCOPY;  Service: Endoscopy;  Laterality: N/A;  . ESOPHAGOGASTRODUODENOSCOPY (EGD) WITH PROPOFOL N/A 04/17/2016   Procedure: ESOPHAGOGASTRODUODENOSCOPY (EGD) WITH PROPOFOL;  Surgeon: Lin Landsman, MD;  Location: [facility] ENDOSCOPY;  Service: Gastroenterology;  Laterality: N/A;  . ESOPHAGOGASTRODUODENOSCOPY (EGD) WITH PROPOFOL  05/09/2016   Procedure: ESOPHAGOGASTRODUODENOSCOPY (EGD) WITH PROPOFOL;  Surgeon: Jerene Bears, MD;  Location: [place];  Service: Endoscopy;;  . ESOPHAGOGASTRODUODENOSCOPY (EGD) WITH PROPOFOL N/A 06/16/2016   Procedure: ESOPHAGOGASTRODUODENOSCOPY (EGD) WITH PROPOFOL;  Surgeon: [name], MD;  Location: [facility] ENDOSCOPY;  Service: Endoscopy;  Laterality: N/A;  .  ESOPHAGOGASTRODUODENOSCOPY (EGD) WITH PROPOFOL N/A 05/05/2017   Procedure: ESOPHAGOGASTRODUODENOSCOPY (EGD) WITH PROPOFOL;  Surgeon: Manya Silvas, MD;  Location: [facility] ENDOSCOPY;  Service: Endoscopy;  Laterality: N/A;  . ESOPHAGOGASTRODUODENOSCOPY (EGD) WITH PROPOFOL N/A 06/15/2017   Procedure: ESOPHAGOGASTRODUODENOSCOPY (EGD) WITH PROPOFOL;  Surgeon: [name], MD;  Location: [facility] ENDOSCOPY;  Service: Endoscopy;  Laterality: N/A;  . GIVENS CAPSULE STUDY N/A 05/07/2016   Procedure: GIVENS CAPSULE STUDY;  Surgeon: Doran Stabler, MD;  Location: [place];  Service: Endoscopy;  Laterality: N/A;  . MANDIBULAR HARDWARE REMOVAL N/A 07/29/2013   Procedure: REMOVAL OF ARCH BARS;  Surgeon: Theodoro Kos, DO;  Location: [place];  Service: Plastics;  Laterality: N/A;  . ORIF MANDIBULAR FRACTURE N/A 06/05/2013   Procedure: REPAIR OF MANDIBULAR FRACTURE x 2 with maxillo-mandibular fixation ;  Surgeon: Theodoro Kos, DO;  Location: [place];  Service: Plastics;  Laterality: N/A;  . PARACENTESIS    . PERIPHERAL ARTERIAL STENT GRAFT Left     Family History  Problem Relation Age of Onset  . Colon cancer Mother   . Cancer Father   . Cancer Sister   . Kidney disease Brother    Social History:  reports that he has been smoking cigarettes. He has a 6.00 pack-year smoking history. He has never used smokeless tobacco. He reports that he does not drink alcohol or use drugs.  Allergies: No Known Allergies  Medications:  I  have reviewed the patient's current medications. Prior to Admission:  Medications Prior to Admission  Medication Sig Dispense Refill Last Dose  . budesonide-formoterol (SYMBICORT) 160-4.5 MCG/ACT inhaler Inhale 2 puffs into the lungs daily. 1 Inhaler 1 Unknown at Unknown  . calcium acetate (PHOSLO) 667 MG capsule Take 2,001 mg by mouth 3 (three) times daily.   Unknown at Unknown  . ferrous sulfate 325 (65 FE) MG tablet Take 1 tablet by mouth 3 (three) times daily.  2  Unknown at Unknown  . folic acid (FOLVITE) 1 MG tablet Take 1 tablet (1 mg total) by mouth daily. 30 tablet 1 Unknown at Unknown  . gabapentin (NEURONTIN) 300 MG capsule Take 300 mg by mouth 2 (two) times daily.   11 Unknown at Unknown  . ipratropium-albuterol (DUONEB) 0.5-2.5 (3) MG/3ML SOLN Take 3 mLs by nebulization every 6 (six) hours as needed. 360 mL 0 Unknown at Unknown  . labetalol (NORMODYNE) 100 MG tablet Take 100 mg by mouth daily.  12 Unknown at Unknown  . multivitamin (RENA-VIT) TABS tablet Take 1 tablet by mouth at bedtime. 30 tablet 0 Unknown at Unknown  . nitroGLYCERIN (NITROSTAT) 0.4 MG SL tablet Place 1 tablet (0.4 mg total) under the tongue every 5 (five) minutes as needed. 25 tablet 6 prn at prn  . omeprazole (PRILOSEC) 40 MG capsule Take 40 mg by mouth daily.  6 Unknown at Unknown  . Spacer/Aero Chamber Mouthpiece MISC 1 Units by Does not apply route every 4 (four) hours as needed (wheezing). 1 each 0  at Unknown time  . SPIRIVA HANDIHALER 18 MCG inhalation capsule Place 1 capsule into inhaler and inhale daily.  11   . tiotropium (SPIRIVA HANDIHALER) 18 MCG inhalation capsule Place 1 capsule (18 mcg total) into inhaler and inhale daily. 30 capsule 2 Unknown at Unknown   Scheduled: . calcium acetate  2,001 mg Oral TID  . Chlorhexidine Gluconate Cloth  6 each Topical Q0600  . epoetin (EPOGEN/PROCRIT) injection  4,000 Units Intravenous Q M,W,F-HD  . ferrous sulfate  325 mg Oral TID  . folic acid  1 mg Oral Daily  . gabapentin  300 mg Oral BID  . heparin  5,000 Units Subcutaneous Q8H  . labetalol  100 mg Oral Daily  . methylPREDNISolone (SOLU-MEDROL) injection  60 mg Intravenous Q6H  . mometasone-formoterol  2 puff Inhalation BID  . multivitamin  1 tablet Oral QHS  . mupirocin ointment  1 application Nasal BID  . nicotine  21 mg Transdermal Daily  . pantoprazole  40 mg Oral Daily  . tiotropium  18 mcg Inhalation Daily    ROS: Patient is a poor historian unable to  obtain accurate ROS.   Physical Examination: Blood pressure (!) 152/95, pulse 90, temperature 97.7 F (36.5 C), temperature source Axillary, resp. rate 20, height 6\' 3"  (1.905 m), weight 81.6 kg, SpO2 99 %.   HEENT-  Normocephalic, no lesions, without obvious abnormality.  Normal external eye and conjunctiva.  Normal TM's bilaterally.  Normal auditory canals and external ears. Normal external nose, mucus membranes and septum.  Normal pharynx. Cardiovascular- S1, S2 normal, pulses palpable throughout   Lungs- chest clear, no wheezing, rales, normal symmetric air entry Abdomen- soft, non-tender; bowel sounds normal; no masses,  no organomegaly Extremities- no edema Lymph-no adenopathy palpable Musculoskeletal-no joint tenderness, deformity or swelling Skin-warm and dry, no hyperpigmentation, vitiligo, or suspicious lesions  Neurological Exam   Mental Status: Alert, oriented, thought content appropriate.  Speech fluent without evidence of aphasia.  Able to follow 3 step commands without difficulty. Attention span and concentration seemed appropriate  Cranial Nerves: II: Discs flat bilaterally; Visual fields grossly normal, pupils equal, round, reactive to light and accommodation III,IV, VI: ptosis not present, extra-ocular motions intact bilaterally V,VII: smile symmetric, facial light touch sensation decreased on the right VIII: hearing normal bilaterally IX,X: gag reflex present XI: bilateral shoulder shrug XII: midline tongue extension Motor: Right :  Upper extremity   5/5 Without pronator drift      Left: Upper extremity   5/5 without pronator drift Right:   Lower extremity   5-/5                                          Left: Lower extremity   5-/5 Tone and bulk:normal tone throughout; no atrophy noted Sensory: Pinprick and light touch decreased on the right lower extremity Deep Tendon Reflexes: 2+ and symmetric throughout Plantars: Right: mute                              Left:  mute Cerebellar: Finger-to-nose testing intact bilaterally. Heel to shin testing normal bilaterally Gait: not tested due to safety concerns  Data Reviewed  Laboratory Studies:  Basic Metabolic Panel: Recent Labs  Lab 01/15/18 0647 01/15/18 1056 01/15/18 1622 01/16/18 0635  NA 141  --  139 140  K 6.1*  --  4.7 5.5*  CL 102  --  101 101  CO2 25  --  28 27  GLUCOSE 102*  --  203* 171*  BUN 57*  --  28* 42*  CREATININE 9.81*  --  6.02* 7.53*  CALCIUM 8.4*  --  8.3* 8.5*  PHOS  --  6.8*  --   --     Liver Function Tests: No results for input(s): AST, ALT, ALKPHOS, BILITOT, PROT, ALBUMIN in the last 168 hours. No results for input(s): LIPASE, AMYLASE in the last 168 hours. No results for input(s): AMMONIA in the last 168 hours.  CBC: Recent Labs  Lab 01/15/18 0647 01/16/18 0635  WBC 16.4* 11.9*  NEUTROABS 14.0*  --   HGB 10.0* 9.4*  HCT 35.7* 33.9*  MCV 87.7 88.1  PLT 346 317    Cardiac Enzymes: Recent Labs  Lab 01/15/18 0647  TROPONINI 0.11*    BNP: Invalid input(s): POCBNP  CBG: Recent Labs  Lab 01/15/18 1435 01/15/18 1758  GLUCAP 135* 151*    Microbiology: Results for orders placed or performed during the hospital encounter of 01/15/18  MRSA PCR Screening     Status: Abnormal   Collection Time: 01/15/18  8:39 AM  Result Value Ref Range Status   MRSA by PCR POSITIVE (A) NEGATIVE Final    Comment:        The GeneXpert MRSA Assay (FDA approved for NASAL specimens only), is one component of a comprehensive MRSA colonization surveillance program. It is not intended to diagnose MRSA infection nor to guide or monitor treatment for MRSA infections. RESULT CALLED TO, READ BACK BY AND VERIFIED WITH: BETH BUONO 01/15/18 1905 REC Performed at [facility], [address] [place], [place] [address]   Culture, blood (Routine X 2) w Reflex to ID Panel     Status: None (Preliminary result)   Collection Time: 01/15/18 11:13 AM  Result Value  Ref Range Status   Specimen  Description BLOOD COLLECT BY DIALYSIS PORT  Final   Special Requests   Final    BOTTLES DRAWN AEROBIC AND ANAEROBIC Blood Culture results may not be optimal due to an excessive volume of blood received in culture bottles   Culture   Final    NO GROWTH < 24 HOURS Performed at [facility], [address] [place], [place] [address]    Report Status PENDING  Incomplete  Culture, blood (Routine X 2) w Reflex to ID Panel     Status: None (Preliminary result)   Collection Time: 01/15/18 11:14 AM  Result Value Ref Range Status   Specimen Description BLOOD COLLECTED BY DIALYSIS PORT  Final   Special Requests   Final    BOTTLES DRAWN AEROBIC AND ANAEROBIC Blood Culture results may not be optimal due to an excessive volume of blood received in culture bottles   Culture   Final    NO GROWTH < 24 HOURS Performed at [facility], [address] [place],  [address]    Report Status PENDING  Incomplete    Coagulation Studies: No results for input(s): LABPROT, INR in the last 72 hours.  Urinalysis: No results for input(s): COLORURINE, LABSPEC, PHURINE, GLUCOSEU, HGBUR, BILIRUBINUR, KETONESUR, PROTEINUR, UROBILINOGEN, NITRITE, LEUKOCYTESUR in the last 168 hours.  Invalid input(s): APPERANCEUR  Lipid Panel: No results found for: CHOL, TRIG, HDL, CHOLHDL, VLDL, LDLCALC  HgbA1C:  Lab Results  Component Value Date   HGBA1C 5.0 06/14/2016    Urine Drug Screen:      Component Value Date/Time   LABOPIA NONE DETECTED 11/11/2017 0126   LABOPIA NONE DETECTED 06/18/2013 1144   COCAINSCRNUR POSITIVE (A) 11/11/2017 0126   LABBENZ TEST NOT PERFORMED, REAGENT NOT AVAILABLE (A) 11/11/2017 0126   LABBENZ NONE DETECTED 06/18/2013 1144   AMPHETMU NONE DETECTED 11/11/2017 0126   AMPHETMU NONE DETECTED 06/18/2013 1144   THCU NONE DETECTED 11/11/2017 0126   THCU NONE DETECTED 06/18/2013 1144   LABBARB NONE DETECTED 11/11/2017 0126   LABBARB NONE  DETECTED 06/18/2013 1144    Alcohol Level: No results for input(s): ETH in the last 168 hours.  Other results: EKG: normal EKG, normal sinus rhythm, unchanged from previous tracings. Vent. rate 90 BPM PR interval * ms QRS duration 119 ms QT/QTc 389/476 ms P-R-T axes 95 91 -61  Imaging: Dg Chest 2 View  Result Date: 01/16/2018 CLINICAL DATA:  Pneumonia. EXAM: CHEST - 2 VIEW COMPARISON:  Radiograph of January 15, 2018. FINDINGS: Stable cardiomegaly. Atherosclerosis of thoracic aorta is noted. Sternotomy wires are noted. Right internal jugular dialysis catheter is unchanged in position. No pneumothorax or pleural effusion is noted. No consolidative process is seen currently. Bony thorax is unremarkable. IMPRESSION: No acute consolidative process is noted. Aortic Atherosclerosis (ICD10-I70.0). Electronically Signed   By: Marijo Conception, M.D.   On: 01/16/2018 07:59   Ct Head Wo Contrast  Result Date: 01/15/2018 CLINICAL DATA:  57 y/o M; episode of altered mental status while eating dinner. EXAM: CT HEAD WITHOUT CONTRAST TECHNIQUE: Contiguous axial images were obtained from the base of the skull through the vertex without intravenous contrast. COMPARISON:  07/09/2013 CT head FINDINGS: Brain: No evidence of acute infarction, hemorrhage, hydrocephalus, extra-axial collection or mass lesion/mass effect. Stable chronic microvascular ischemic changes and volume loss of the brain. Stable small chronic lacunar infarcts within the right caudate head, right putamen, and left anterior limb of internal capsule. Vascular: Calcific atherosclerosis of the carotid siphons. No hyperdense vessel identified. Skull: Normal. Negative  for fracture or focal lesion. Sinuses/Orbits: No acute finding. Other: None. IMPRESSION: 1. No acute intracranial abnormality identified. 2. Stable chronic microvascular ischemic changes, brain volume loss, and chronic lacunar infarcts of basal ganglia. Electronically Signed   By: Kristine Garbe M.D.   On: 01/15/2018 18:23   Dg Chest Port 1 View  Result Date: 01/15/2018 CLINICAL DATA:  Respiratory distress. Dialysis patient. Shortness of breath. EXAM: PORTABLE CHEST 1 VIEW COMPARISON:  12/26/2017 FINDINGS: Sternotomy wires and right IJ dialysis catheter unchanged. Lungs are adequately inflated with minimal stable prominence of the perihilar markings likely mild vascular congestion. There is patchy density over the right mid to lower lung which may be due to atelectasis or infection. No effusion. Mild stable cardiomegaly. Remainder the exam is unchanged. IMPRESSION: Patchy density over the right mid to lower lung which may be due to atelectasis or infection. Mild cardiomegaly with mild stable vascular congestion. Electronically Signed   By: Marin Olp M.D.   On: 01/15/2018 07:14   Patient seen and examined.  Clinical course and management discussed.  Necessary edits performed.  I agree with the above.  Assessment and plan of care developed and discussed below.     Assessment: 57 y.o. male with pertinent history of end-stage renal disease on hemodialysis, drug abuse, alcohol abuse, liver cirrhosis, coronary artery disease, hypertension, hyperlipidemia presenting with complaint of shortness of breath and respiratory distress. Patient has had a fluctuating neurological examination and at times is noted to have some right sided weakness and on other times does not.  Sensory exam is not static either.  Patient is a poor historian.  Mental status likely multifactorial in etiology in the setting of metabolic disturbances, infectious process and respiratory issues.  With fluctuating neurological examination can not rule out acute infarct as well.  CT head reviewed and shows no acute intracranial abnormality. Will need further work up.  Stroke Risk Factors - hyperlipidemia, hypertension and smoking  Plan: 1. HgbA1c, fasting lipid panel 2. MRI  of the brain without contrast.  If  indicative of an acute ischemic event would proceed with further stroke work up to include echocardiogram and carotid dopplers 3. PT consult, OT consult, Speech consult 4. Echocardiogram 5. Carotid dopplers 6. ASA 81mg  daily 7. EEG 8. Telemetry monitoring 9. Frequent neuro checks  01/16/2018, 9:54 AM  Alexis Goodell, MD Neurology [phone]  01/16/2018  1:44 PM

## 2018-01-16 NOTE — Progress Notes (Signed)
Pharmacy Antibiotic Note  Stanley [name] is a 57 y.o. male admitted on 01/15/2018 with pneumonia. Patient was transferred to ICU on 11/4 due to decreased level of responsiveness. Pharmacy has been consulted for cefepime and vancomycin dosing. Patient receives HD on T Th Sat. Patient did not receive HD today.   Plan: Patient did not receive HD today, but did get Vancomycin 750 mg IV dose. Will check random level prior to HD on 11/6. Goal trough 15-20.  Height: 6\' 3"  (190.5 cm) Weight: 179 lb 14.3 oz (81.6 kg) IBW/kg (Calculated) : 84.5  Temp (24hrs), Avg:97.8 F (36.6 C), Min:97.7 F (36.5 C), Max:98.1 F (36.7 C)  Recent Labs  Lab 01/15/18 0647 01/15/18 0653 01/15/18 1056 01/15/18 1622 01/16/18 0635  WBC 16.4*  --   --   --  11.9*  CREATININE 9.81*  --   --  6.02* 7.53*  LATICACIDVEN  --  1.8 0.8  --   --     Estimated Creatinine Clearance: 12.3 mL/min (A) (by C-G formula based on SCr of 7.53 mg/dL (H)).    No Known Allergies  Antimicrobials this admission: 11/4 cefepime >>  11/4 vancomycin >>   Dose adjustments this admission: 11/4 Cefepime adjusted to 1 g q24h due to pt on HD.   Microbiology results:  11/4 MRSA PCR: (+).   Thank you for allowing pharmacy to be a part of this patient's care.   Paticia Stack, PharmD Pharmacy Resident  01/16/2018 5:02 PM

## 2018-01-16 NOTE — Progress Notes (Signed)
Pt complaining of nausea, pt noted to have abdominal distention on exam.  Order placed for KUB.  KUB with Gaseous distention of the stomach with suggestion of gastric fold thickening, concerning for gastroparesis and/or gastritis.  Will place order for Reglan, and will check H. Pylori stool antigen.   Darel Hong, AGACNP-BC [facility] Pager: [phone] Cell: [phone]

## 2018-01-16 NOTE — Progress Notes (Signed)
Follow up - Critical Care Medicine Note  Patient Details:    Stanley [name] is an 57 y.o. male.with a past medical history remarkable for congestive heart failure, cirrhosis, COPD, alcohol use, prior history of drug abuse, coronary artery disease, peripheral arterial disease, hypertension, hyperlipidemia, gastroesophageal reflux disease, end-stage renal disease on Tuesday Thursday Saturday hemodialysis, presented from home with acute onset of shortness of breath. He was initially started on BiPAP in the emergency department and was also noted to be hyperkalemic with potassium of 6.1.  He was taken for emergent hemodialysis and arrives in the intensive care unit on nasal cannula.    Lines, Airways, Drains:    Anti-infectives:  Anti-infectives (From admission, onward)   Start     Dose/Rate Route Frequency Ordered Stop   01/16/18 1200  vancomycin (VANCOCIN) IVPB 750 mg/150 ml premix     750 mg 150 mL/hr over 60 Minutes Intravenous Every T-Th-Sa (Hemodialysis) 01/15/18 0842     01/16/18 1000  ceFEPIme (MAXIPIME) 1 g in sodium chloride 0.9 % 100 mL IVPB     1 g 200 mL/hr over 30 Minutes Intravenous Every 24 hours 01/15/18 1604     01/15/18 1600  vancomycin (VANCOCIN) IVPB 750 mg/150 ml premix     750 mg 150 mL/hr over 60 Minutes Intravenous  Once 01/15/18 0842 01/15/18 1851   01/15/18 0900  ceFEPIme (MAXIPIME) 1 g in sodium chloride 0.9 % 100 mL IVPB  Status:  Discontinued     1 g 200 mL/hr over 30 Minutes Intravenous Every 24 hours 01/15/18 0835 01/15/18 1604   01/15/18 0845  vancomycin (VANCOCIN) IVPB 1000 mg/200 mL premix     1,000 mg 200 mL/hr over 60 Minutes Intravenous  Once 01/15/18 8338 01/15/18 1858      Microbiology: Results for orders placed or performed during the hospital encounter of 01/15/18  MRSA PCR Screening     Status: Abnormal   Collection Time: 01/15/18  8:39 AM  Result Value Ref Range Status   MRSA by PCR POSITIVE (A) NEGATIVE Final    Comment:        The  GeneXpert MRSA Assay (FDA approved for NASAL specimens only), is one component of a comprehensive MRSA colonization surveillance program. It is not intended to diagnose MRSA infection nor to guide or monitor treatment for MRSA infections. RESULT CALLED TO, READ BACK BY AND VERIFIED WITH: BETH BUONO 01/15/18 1905 REC Performed at [facility], [address] [place], [place] [address]   Culture, blood (Routine X 2) w Reflex to ID Panel     Status: None (Preliminary result)   Collection Time: 01/15/18 11:13 AM  Result Value Ref Range Status   Specimen Description BLOOD COLLECT BY DIALYSIS PORT  Final   Special Requests   Final    BOTTLES DRAWN AEROBIC AND ANAEROBIC Blood Culture results may not be optimal due to an excessive volume of blood received in culture bottles   Culture   Final    NO GROWTH < 24 HOURS Performed at [facility], [address], [place], [place] [address]    Report Status PENDING  Incomplete  Culture, blood (Routine X 2) w Reflex to ID Panel     Status: None (Preliminary result)   Collection Time: 01/15/18 11:14 AM  Result Value Ref Range Status   Specimen Description BLOOD COLLECTED BY DIALYSIS PORT  Final   Special Requests   Final    BOTTLES DRAWN AEROBIC AND ANAEROBIC Blood Culture results may not  be optimal due to an excessive volume of blood received in culture bottles   Culture   Final    NO GROWTH < 24 HOURS Performed at [facility], [address] [place], [place] [address]    Report Status PENDING  Incomplete     Studies: Dg Chest 2 View  Result Date: 01/16/2018 CLINICAL DATA:  Pneumonia. EXAM: CHEST - 2 VIEW COMPARISON:  Radiograph of January 15, 2018. FINDINGS: Stable cardiomegaly. Atherosclerosis of thoracic aorta is noted. Sternotomy wires are noted. Right internal jugular dialysis catheter is unchanged in position. No pneumothorax or pleural effusion is noted. No consolidative process is seen currently. Bony  thorax is unremarkable. IMPRESSION: No acute consolidative process is noted. Aortic Atherosclerosis (ICD10-I70.0). Electronically Signed   By: Marijo Conception, M.D.   On: 01/16/2018 07:59   Dg Chest 2 View  Result Date: 12/26/2017 CLINICAL DATA:  Fever.  Pulmonary edema. EXAM: CHEST - 2 VIEW COMPARISON:  12/23/2017. FINDINGS: Dual-lumen catheter with tip over the superior vena cava. Prior CABG. Cardiomegaly with pulmonary vascular prominence and bilateral interstitial prominence suggesting mild CHF. Interim improvement from prior exam. No pleural effusion. No pneumothorax. No acute bony abnormality. IMPRESSION: 1.  Dual-lumen catheter stable position. 2. Prior CABG. Cardiomegaly with pulmonary venous congestion bilateral interstitial prominence consistent with CHF. Interim improvement from prior exam. Electronically Signed   By: Marcello Moores  Register   On: 12/26/2017 09:07   Dg Chest 2 View  Result Date: 12/23/2017 CLINICAL DATA:  Shortness of breath. EXAM: CHEST - 2 VIEW COMPARISON:  Chest x-ray dated 12/09/2017. FINDINGS: Stable cardiomegaly. Dialysis catheter is stable in position with tip at the level of the mid SVC. Mildly prominent interstitial markings noted bilaterally, slightly more prominent than on previous studies suggesting mild interstitial edema related to CHF. No confluent opacity to suggest pneumonia or alveolar pulmonary edema. No pleural effusion or pneumothorax seen. IMPRESSION: 1. Mild interstitial edema indicating mild CHF/volume overload. 2. Stable cardiomegaly. Electronically Signed   By: Franki Cabot M.D.   On: 12/23/2017 18:59   Ct Head Wo Contrast  Result Date: 01/15/2018 CLINICAL DATA:  57 y/o M; episode of altered mental status while eating dinner. EXAM: CT HEAD WITHOUT CONTRAST TECHNIQUE: Contiguous axial images were obtained from the base of the skull through the vertex without intravenous contrast. COMPARISON:  07/09/2013 CT head FINDINGS: Brain: No evidence of acute  infarction, hemorrhage, hydrocephalus, extra-axial collection or mass lesion/mass effect. Stable chronic microvascular ischemic changes and volume loss of the brain. Stable small chronic lacunar infarcts within the right caudate head, right putamen, and left anterior limb of internal capsule. Vascular: Calcific atherosclerosis of the carotid siphons. No hyperdense vessel identified. Skull: Normal. Negative for fracture or focal lesion. Sinuses/Orbits: No acute finding. Other: None. IMPRESSION: 1. No acute intracranial abnormality identified. 2. Stable chronic microvascular ischemic changes, brain volume loss, and chronic lacunar infarcts of basal ganglia. Electronically Signed   By: Kristine Garbe M.D.   On: 01/15/2018 18:23   US Paracentesis  Result Date: 12/25/2017 INDICATION: Recurrent ascites, renal failure, abdominal distension EXAM: ULTRASOUND GUIDED left PARACENTESIS MEDICATIONS: 1% lidocaine local COMPLICATIONS: None immediate. PROCEDURE: Informed written consent was obtained from the patient after a discussion of the risks, benefits and alternatives to treatment. A timeout was performed prior to the initiation of the procedure. Initial ultrasound scanning demonstrates a large amount of ascites within the left lower abdominal quadrant. The right lower abdomen was prepped and draped in the usual sterile fashion. 1% lidocaine with epinephrine  was used for local anesthesia. Following this, a 6 Fr Safe-T-Centesis catheter was introduced. An ultrasound image was saved for documentation purposes. The paracentesis was performed. The catheter was removed and a dressing was applied. The patient tolerated the procedure well without immediate post procedural complication. FINDINGS: A total of approximately 6.75 of clear peritoneal fluid was removed. Sample was not sent for laboratory analysis IMPRESSION: Successful ultrasound-guided paracentesis yielding 6.75 liters of peritoneal fluid. Electronically  Signed   By: Jerilynn Mages.  Shick M.D.   On: 12/25/2017 10:58   Dg Chest Port 1 View  Result Date: 01/15/2018 CLINICAL DATA:  Respiratory distress. Dialysis patient. Shortness of breath. EXAM: PORTABLE CHEST 1 VIEW COMPARISON:  12/26/2017 FINDINGS: Sternotomy wires and right IJ dialysis catheter unchanged. Lungs are adequately inflated with minimal stable prominence of the perihilar markings likely mild vascular congestion. There is patchy density over the right mid to lower lung which may be due to atelectasis or infection. No effusion. Mild stable cardiomegaly. Remainder the exam is unchanged. IMPRESSION: Patchy density over the right mid to lower lung which may be due to atelectasis or infection. Mild cardiomegaly with mild stable vascular congestion. Electronically Signed   By: Marin Olp M.D.   On: 01/15/2018 07:14    Consults: Treatment Team:  Alexis Goodell, MD   Subjective:    Overnight Issues: Over last night he had some intermittent issues of altered level of consciousness.  CT scan of the head was negative, pending results of MRI.  Concern for possible seizure activity nonconvulsive.  Pending EEG and neurology consultation  Objective:  Vital signs for last 24 hours: Temp:  [97.7 F (36.5 C)-99 F (37.2 C)] 97.7 F (36.5 C) (11/05 0800) Pulse Rate:  [79-95] 90 (11/05 0900) Resp:  [14-27] 20 (11/05 0900) BP: (104-171)/(23-97) 152/95 (11/05 0900) SpO2:  [89 %-100 %] 99 % (11/05 0900) Weight:  [75 kg-81.6 kg] 81.6 kg (11/04 1438)  Hemodynamic parameters for last 24 hours:    Intake/Output from previous day: 11/04 0701 - 11/05 0700 In: 250 [IV Piggyback:250] Out: 3049   Intake/Output this shift: Total I/O In: 454.6 [P.O.:350; I.V.:4.6; IV Piggyback:100] Out: 0   Vent settings for last 24 hours:    Physical Exam:   General:          Patient is awake alert and in no acute distress, presently eating, on nasal cannula with stable hemodynamics HEENT:           Trachea midline,  no oral lesions noted, mild jugular venous distention is appreciated no thyromegaly noted Cardiovascular:           Regular rate and rhythm Pulmonary:      Prolonged expiratory phase, diffuse rhonchi right greater than left Abdominal:      Positive bowel sounds, soft exam Extremities:     No clubbing, cyanosis or edema noted Neurologic:      Patient was all extremities, no focal deficits appreciated Cutaneous:      No rashes or lesions noted  Assessment/Plan:   Respiratory failure.  Patient is now status post hemodialysis, 3 L were removed, has been successfully weaned off of BiPAP presently on nasal cannula with stable hemodynamics.  Patient does have evidence of a right middle to lower lobe infiltrate on his chest x-ray, febrile and white count.  Agree with empiric broad-spectrum antibiotic coverage.  He also has evidence of bronchospasm on exam, agree with Solu-Medrol, albuterol, Atrovent and inhaled steroid.  Hyperkalemia.  Status post hemodialysis, this morning's level 5.5  appreciate neurology's assistance  Ascites.  Patient in need of paracentesis per IR  Leukocytosis.  On vancomycin and cefepime if MRSA screen is negative we will stop vancomycin and continue cefepime for 7 days  Anemia.  No evidence of active bleeding  Intermittent altered level of consciousness unclear etiology.  Pending MRI, EEG and neurology assistance.  CT scan of head was negative for acute intracranial abnormality  Hermelinda Dellen, DO   Laelynn Blizzard 01/16/2018  *Care during the described time interval was provided by me and/or other providers on the critical care team.  I have reviewed this patient's available data, including medical history, events of note, physical examination and test results as part of my evaluation.

## 2018-01-16 NOTE — Progress Notes (Signed)
Pt was in good spirit, he said he was coping well.    01/16/18 2100  Clinical Encounter Type  Visited With Patient  Visit Type Initial  Spiritual Encounters  Spiritual Needs Other (Comment)

## 2018-01-16 NOTE — Progress Notes (Signed)
eeg completed ° °

## 2018-01-17 ENCOUNTER — Encounter (INDEPENDENT_AMBULATORY_CARE_PROVIDER_SITE_OTHER): Payer: Medicare Other | Admitting: Nurse Practitioner

## 2018-01-17 ENCOUNTER — Other Ambulatory Visit (INDEPENDENT_AMBULATORY_CARE_PROVIDER_SITE_OTHER): Payer: Medicare Other

## 2018-01-17 ENCOUNTER — Inpatient Hospital Stay: Payer: Medicare Other

## 2018-01-17 ENCOUNTER — Encounter (INDEPENDENT_AMBULATORY_CARE_PROVIDER_SITE_OTHER): Payer: Medicare Other

## 2018-01-17 LAB — PROCALCITONIN: Procalcitonin: 0.67 ng/mL

## 2018-01-17 LAB — BASIC METABOLIC PANEL
Anion gap: 14 (ref 5–15)
BUN: 63 mg/dL — ABNORMAL HIGH (ref 6–20)
CHLORIDE: 100 mmol/L (ref 98–111)
CO2: 24 mmol/L (ref 22–32)
Calcium: 8.6 mg/dL — ABNORMAL LOW (ref 8.9–10.3)
Creatinine, Ser: 8.56 mg/dL — ABNORMAL HIGH (ref 0.61–1.24)
GFR calc Af Amer: 7 mL/min — ABNORMAL LOW (ref >60)
GFR calc non Af Amer: 6 mL/min — ABNORMAL LOW (ref >60)
GLUCOSE: 140 mg/dL — AB (ref 70–99)
POTASSIUM: 6 mmol/L — AB (ref 3.5–5.1)
Sodium: 138 mmol/L (ref 135–145)

## 2018-01-17 LAB — CBC
HCT: 36 % — ABNORMAL LOW (ref 39.0–52.0)
HEMOGLOBIN: 10 g/dL — AB (ref 13.0–17.0)
MCH: 24.3 pg — AB (ref 26.0–34.0)
MCHC: 27.8 g/dL — ABNORMAL LOW (ref 30.0–36.0)
MCV: 87.4 fL (ref 80.0–100.0)
Platelets: 366 10*3/uL (ref 150–400)
RBC: 4.12 MIL/uL — AB (ref 4.22–5.81)
RDW: 19.2 % — ABNORMAL HIGH (ref 11.5–15.5)
WBC: 14.9 10*3/uL — ABNORMAL HIGH (ref 4.0–10.5)
nRBC: 0 % (ref 0.0–0.2)

## 2018-01-17 LAB — PHOSPHORUS: Phosphorus: 8.6 mg/dL — ABNORMAL HIGH (ref 2.5–4.6)

## 2018-01-17 LAB — MAGNESIUM: Magnesium: 2.2 mg/dL (ref 1.7–2.4)

## 2018-01-17 MED ORDER — RENA-VITE PO TABS
1.0000 | ORAL_TABLET | Freq: Every day | ORAL | Status: DC
  Administered 2018-01-17 – 2018-01-18 (×2): 1 via ORAL
  Filled 2018-01-17 (×2): qty 1

## 2018-01-17 MED ORDER — VITAMIN C 500 MG PO TABS
500.0000 mg | ORAL_TABLET | Freq: Two times a day (BID) | ORAL | Status: DC
  Administered 2018-01-17 – 2018-01-18 (×4): 500 mg via ORAL
  Filled 2018-01-17 (×4): qty 1

## 2018-01-17 MED ORDER — SODIUM CHLORIDE 0.9 % IV SOLN
1.0000 g | INTRAVENOUS | Status: DC
  Administered 2018-01-17 – 2018-01-18 (×2): 1 g via INTRAVENOUS
  Filled 2018-01-17 (×3): qty 1

## 2018-01-17 MED ORDER — ALBUMIN HUMAN 25 % IV SOLN
12.5000 g | Freq: Once | INTRAVENOUS | Status: AC
  Administered 2018-01-17: 12.5 g via INTRAVENOUS
  Filled 2018-01-17: qty 50

## 2018-01-17 MED ORDER — GUAIFENESIN-DM 100-10 MG/5ML PO SYRP
10.0000 mL | ORAL_SOLUTION | Freq: Four times a day (QID) | ORAL | Status: DC
  Administered 2018-01-17 – 2018-01-19 (×6): 10 mL via ORAL
  Filled 2018-01-17 (×6): qty 10

## 2018-01-17 NOTE — Progress Notes (Signed)
HD tx end    01/17/18 1423  Vital Signs  Pulse Rate 85  Pulse Rate Source Monitor  Resp 16  BP 130/70  BP Location Left Arm  BP Method Automatic  Patient Position (if appropriate) Lying  Oxygen Therapy  SpO2 97 %  O2 Device Nasal Cannula  O2 Flow Rate (L/min) 2 L/min  During Hemodialysis Assessment  Dialysis Fluid Bolus Normal Saline  Bolus Amount (mL) 250 mL  Intra-Hemodialysis Comments Tx completed

## 2018-01-17 NOTE — Procedures (Signed)
Pre Procedural Dx: Symptomatic Ascites Post Procedural Dx: Same  Successful US guided paracentesis yielding 5 L of serous ascitic fluid.  EBL: None Complications: None immediate  Jay Gregory Barrick, MD Pager #: [phone]   

## 2018-01-17 NOTE — Progress Notes (Addendum)
Subjective: Patient is awake, alert and oriented x 4. He is complaining of shortness of breath, states that he usually feels like this when he has "too much fluid" and prior to next hemodialysis. Patient also state that he has been having intermittent episodes of nausea and feeling "of fullness". He otherwise denies any new issues or complaints. He is scheduled for hemodialysis today.  Objective: Current vital signs: BP 122/71 (BP Location: Left Arm)   Pulse 81   Temp 98.3 F (36.8 C) (Oral)   Resp 19   Ht 6\' 3"  (1.905 m)   Wt 88.3 kg   SpO2 90%   BMI 24.33 kg/m  Vital signs in last 24 hours: Temp:  [97.7 F (36.5 C)-98.3 F (36.8 C)] 98.3 F (36.8 C) (11/06 1043) Pulse Rate:  [70-87] 81 (11/06 1245) Resp:  [11-21] 19 (11/06 1245) BP: (120-152)/(69-104) 122/71 (11/06 1230) SpO2:  [90 %-100 %] 90 % (11/06 1245) Weight:  [88.3 kg] 88.3 kg (11/06 1043)  Intake/Output from previous day: 11/05 0701 - 11/06 0700 In: 1135.5 [P.O.:827; I.V.:20.4; IV Piggyback:288.2] Out: 250 [Urine:250] Intake/Output this shift: No intake/output data recorded. Nutritional status:  Diet Order            Diet renal with fluid restriction Fluid restriction: 1200 mL Fluid; Room service appropriate? Yes; Fluid consistency: Thin  Diet effective now             Neurologic Exam:  Mental Status: Alert, oriented, thought content appropriate. Speech fluent without evidence of aphasia. Able to follow 3 step commands without difficulty. Attention span and concentration seemed appropriate  Cranial Nerves: II: Discs flat bilaterally; Visual fields grossly normal, pupils equal, round, reactive to light and accommodation III,IV, VI: ptosis not present, extra-ocular motions intact bilaterally V,VII: smile symmetric, facial light touch intact bilaterally. VIII: hearing normal bilaterally IX,X: gag reflex present XI: bilateral shoulder shrug XII: midline tongue extension Motor: Right :Upper extremity  5/5Without pronator driftLeft: Upper extremity 5/5 without pronator drift Right:Lower extremity 5-/5Left: Lower extremity 5-/5 Tone and bulk:normal tone throughout; no atrophy noted Sensory: Pinprick and light touch decreased on the right lower extremity Deep Tendon Reflexes: 2+ and symmetric throughout Plantars: Right:muteLeft: mute Cerebellar: Finger-to-nosetesting intact bilaterally.Heel to shin testing normal bilaterally Gait: not tested due to safety concerns  Data Reviewed  Lab Results: Basic Metabolic Panel: Recent Labs  Lab 01/15/18 0647 01/15/18 1056 01/15/18 1622 01/16/18 0635 01/17/18 0552  NA 141  --  139 140 138  K 6.1*  --  4.7 5.5* 6.0*  CL 102  --  101 101 100  CO2 25  --  28 27 24   GLUCOSE 102*  --  203* 171* 140*  BUN 57*  --  28* 42* 63*  CREATININE 9.81*  --  6.02* 7.53* 8.56*  CALCIUM 8.4*  --  8.3* 8.5* 8.6*  MG  --   --   --   --  2.2  PHOS  --  6.8*  --   --  8.6*    Liver Function Tests: No results for input(s): AST, ALT, ALKPHOS, BILITOT, PROT, ALBUMIN in the last 168 hours. No results for input(s): LIPASE, AMYLASE in the last 168 hours. No results for input(s): AMMONIA in the last 168 hours.  CBC: Recent Labs  Lab 01/15/18 0647 01/16/18 0635 01/17/18 0552  WBC 16.4* 11.9* 14.9*  NEUTROABS 14.0*  --   --   HGB 10.0* 9.4* 10.0*  HCT 35.7* 33.9* 36.0*  MCV 87.7 88.1 87.4  PLT 346  317 366    Cardiac Enzymes: Recent Labs  Lab 01/15/18 0647  TROPONINI 0.11*    Lipid Panel: No results for input(s): CHOL, TRIG, HDL, CHOLHDL, VLDL, LDLCALC in the last 168 hours.  CBG: Recent Labs  Lab 01/15/18 1435 01/15/18 1758  GLUCAP 135* 151*    Microbiology: Results for orders placed or performed during the hospital encounter of 01/15/18  MRSA PCR Screening     Status: Abnormal   Collection Time: 01/15/18  8:39 AM  Result Value Ref Range Status    MRSA by PCR POSITIVE (A) NEGATIVE Final    Comment:        The GeneXpert MRSA Assay (FDA approved for NASAL specimens only), is one component of a comprehensive MRSA colonization surveillance program. It is not intended to diagnose MRSA infection nor to guide or monitor treatment for MRSA infections. RESULT CALLED TO, READ BACK BY AND VERIFIED WITH: BETH BUONO 01/15/18 1905 REC Performed at [facility], [address] [place], [place] [address]   Culture, blood (Routine X 2) w Reflex to ID Panel     Status: None (Preliminary result)   Collection Time: 01/15/18 11:13 AM  Result Value Ref Range Status   Specimen Description BLOOD COLLECT BY DIALYSIS PORT  Final   Special Requests   Final    BOTTLES DRAWN AEROBIC AND ANAEROBIC Blood Culture results may not be optimal due to an excessive volume of blood received in culture bottles   Culture   Final    NO GROWTH 2 DAYS Performed at [facility], [address], [place], [place] [address]    Report Status PENDING  Incomplete  Culture, blood (Routine X 2) w Reflex to ID Panel     Status: None (Preliminary result)   Collection Time: 01/15/18 11:14 AM  Result Value Ref Range Status   Specimen Description BLOOD COLLECTED BY DIALYSIS PORT  Final   Special Requests   Final    BOTTLES DRAWN AEROBIC AND ANAEROBIC Blood Culture results may not be optimal due to an excessive volume of blood received in culture bottles   Culture   Final    NO GROWTH 2 DAYS Performed at [facility], [address] [place], [place] [address]    Report Status PENDING  Incomplete    Coagulation Studies: No results for input(s): LABPROT, INR in the last 72 hours.  Imaging: Dg Eye Foreign Body  Result Date: 01/16/2018 CLINICAL DATA:  Metal working/exposure; clearance prior to MRI EXAM: ORBITS FOR FOREIGN BODY - 2 VIEW COMPARISON:  None. FINDINGS: There is no evidence of metallic foreign body within the orbits. No significant  bone abnormality identified. The observed paranasal sinuses are clear. IMPRESSION: No evidence of metallic foreign body within the orbits. Electronically Signed   By: David  Martinique M.D.   On: 01/16/2018 13:38   Dg Chest 2 View  Result Date: 01/16/2018 CLINICAL DATA:  Pneumonia. EXAM: CHEST - 2 VIEW COMPARISON:  Radiograph of January 15, 2018. FINDINGS: Stable cardiomegaly. Atherosclerosis of thoracic aorta is noted. Sternotomy wires are noted. Right internal jugular dialysis catheter is unchanged in position. No pneumothorax or pleural effusion is noted. No consolidative process is seen currently. Bony thorax is unremarkable. IMPRESSION: No acute consolidative process is noted. Aortic Atherosclerosis (ICD10-I70.0). Electronically Signed   By: Marijo Conception, M.D.   On: 01/16/2018 07:59   Dg Abd 1 View  Result Date: 01/16/2018 CLINICAL DATA:  Abdominal distention. EXAM: ABDOMEN - 1 VIEW COMPARISON:  None.  FINDINGS: Gaseous distention of the stomach with suggestion of gastric fold thickening. This may indicate gastroparesis and/or gastritis. Scattered gas and stool in the colon. No small or large bowel distention. No radiopaque stones. Vascular calcifications with iliac stents. Visualized bones appear intact. No radiopaque soft tissue foreign bodies. IMPRESSION: Gaseous distention of the stomach with suggestion of gastric fold thickening. Changes may represent gastroparesis and/or gastritis. Electronically Signed   By: Lucienne Capers M.D.   On: 01/16/2018 22:06   Ct Head Wo Contrast  Result Date: 01/15/2018 CLINICAL DATA:  57 y/o M; episode of altered mental status while eating dinner. EXAM: CT HEAD WITHOUT CONTRAST TECHNIQUE: Contiguous axial images were obtained from the base of the skull through the vertex without intravenous contrast. COMPARISON:  07/09/2013 CT head FINDINGS: Brain: No evidence of acute infarction, hemorrhage, hydrocephalus, extra-axial collection or mass lesion/mass effect. Stable  chronic microvascular ischemic changes and volume loss of the brain. Stable small chronic lacunar infarcts within the right caudate head, right putamen, and left anterior limb of internal capsule. Vascular: Calcific atherosclerosis of the carotid siphons. No hyperdense vessel identified. Skull: Normal. Negative for fracture or focal lesion. Sinuses/Orbits: No acute finding. Other: None. IMPRESSION: 1. No acute intracranial abnormality identified. 2. Stable chronic microvascular ischemic changes, brain volume loss, and chronic lacunar infarcts of basal ganglia. Electronically Signed   By: Kristine Garbe M.D.   On: 01/15/2018 18:23   Mr Brain Wo Contrast  Result Date: 01/16/2018 CLINICAL DATA:  57 y/o M; right facial numbness and right-sided weakness. EXAM: MRI HEAD WITHOUT CONTRAST TECHNIQUE: Multiplanar, multiecho pulse sequences of the brain and surrounding structures were obtained without intravenous contrast. COMPARISON:  01/15/2018 CT head. FINDINGS: Brain: No acute infarction, hemorrhage, hydrocephalus, extra-axial collection or mass lesion. Small chronic infarcts are present within the right caudate head, right lentiform nucleus, left anterior limb of internal capsule, and right paramedian pons. Moderate volume loss of the brain for age. Vascular: Normal flow voids. Skull and upper cervical spine: Normal marrow signal. Sinuses/Orbits: Negative. Other: None. IMPRESSION: 1. No acute intracranial abnormality identified. 2. Stable chronic infarcts within the basal ganglia and volume loss of the brain. Electronically Signed   By: Kristine Garbe M.D.   On: 01/16/2018 22:59    Medications:  I have reviewed the patient's current medications. Prior to Admission:  Medications Prior to Admission  Medication Sig Dispense Refill Last Dose  . budesonide-formoterol (SYMBICORT) 160-4.5 MCG/ACT inhaler Inhale 2 puffs into the lungs daily. 1 Inhaler 1 unknown at unknown  . calcium acetate  (PHOSLO) 667 MG capsule Take 2,001 mg by mouth 3 (three) times daily.   unknown at unknown  . folic acid (FOLVITE) 1 MG tablet Take 1 tablet (1 mg total) by mouth daily. 30 tablet 1 unknown at unknown  . gabapentin (NEURONTIN) 300 MG capsule Take 300 mg by mouth 3 (three) times daily.   11 unknown at unknown  . multivitamin (RENA-VIT) TABS tablet Take 1 tablet by mouth at bedtime. 30 tablet 0 unknown at unknown  . nitroGLYCERIN (NITROSTAT) 0.4 MG SL tablet Place 1 tablet (0.4 mg total) under the tongue every 5 (five) minutes as needed. 25 tablet 6 prn at prn  . omeprazole (PRILOSEC) 40 MG capsule Take 40 mg by mouth daily.  6 unknown at unknown  . Spacer/Aero Chamber Mouthpiece MISC 1 Units by Does not apply route every 4 (four) hours as needed (wheezing). 1 each 0 prn at prn  . SPIRIVA HANDIHALER 18 MCG inhalation capsule Place 1  capsule into inhaler and inhale daily.  11 unknown at unknown  . ferrous sulfate 325 (65 FE) MG tablet Take 1 tablet by mouth 3 (three) times daily.  2 Not Taking at Unknown time  . ipratropium-albuterol (DUONEB) 0.5-2.5 (3) MG/3ML SOLN Take 3 mLs by nebulization every 6 (six) hours as needed. 360 mL 0 prn at prn  . labetalol (NORMODYNE) 100 MG tablet Take 100 mg by mouth daily.  12 Not Taking at Unknown time  . tiotropium (SPIRIVA HANDIHALER) 18 MCG inhalation capsule Place 1 capsule (18 mcg total) into inhaler and inhale daily. 30 capsule 2 Unknown at Unknown   Scheduled: . calcium acetate  2,001 mg Oral TID  . Chlorhexidine Gluconate Cloth  6 each Topical Q0600  . epoetin (EPOGEN/PROCRIT) injection  4,000 Units Intravenous Q M,W,F-HD  . ferrous sulfate  325 mg Oral TID  . folic acid  1 mg Oral Daily  . gabapentin  300 mg Oral BID  . guaiFENesin-dextromethorphan  10 mL Oral Q6H  . heparin  5,000 Units Subcutaneous Q8H  . labetalol  100 mg Oral Daily  . methylPREDNISolone (SOLU-MEDROL) injection  60 mg Intravenous Q6H  . metoCLOPramide (REGLAN) injection  5 mg  Intravenous Q12H  . mometasone-formoterol  2 puff Inhalation BID  . multivitamin  1 tablet Oral QHS  . mupirocin ointment  1 application Nasal BID  . nicotine  21 mg Transdermal Daily  . pantoprazole  40 mg Oral Daily  . tiotropium  18 mcg Inhalation Daily  . vitamin C  500 mg Oral BID   Patient seen and examined.  Clinical course and management discussed.  Necessary edits performed.  I agree with the above.  Assessment and plan of care developed and discussed below.    Assessment: 57 y.o. male presenting with complaint of shortness of breath and respiratory distress with fluctuating neurological examination. Remains stable with non-focal exam. Mental status now improving. MRI brain reviewed and shows no acute intracranial abnormality. Stable chronic infarcts within the basal ganglia and volume loss of the brain noted. EEG abnormal due to generalized slowing otherwise no evidence of epileptiform discharges. No further work up indicated from neurology standpoint.  Plan: 1. Prophylactic therapy-Antiplatelet med: Aspirin - dose 81 mg/day.  Although no acute infarcts patient has had infarcts in the past.   2. Agree with current medical management for underlying conditions.  This patient was staffed with Dr. Magda Paganini, Doy Mince who personally evaluated patient, reviewed documentation and agreed with assessment and plan of care as above.  Rufina Falco, DNP, FNP-BC Board certified Nurse Practitioner Neurology Department   LOS: 2 days   01/17/2018  12:46 PM  Alexis Goodell, MD Neurology [phone]  01/17/2018  2:12 PM

## 2018-01-17 NOTE — Progress Notes (Signed)
RN in isolation room, unable to give report , RN will call back to give report prior to HD tx.    01/17/18 1011  Hand-Off documentation  Report given to (Full Name) Stark Bray  Report received from (Full Name) Venora Maples

## 2018-01-17 NOTE — Progress Notes (Signed)
Pharmacy Antibiotic Note  Stanley [name] is a 57 y.o. male admitted on 01/15/2018 with pneumonia. Patient was transferred to ICU on 11/4 due to decreased level of responsiveness. Pharmacy has been consulted for cefepime dosing. Patient receives HD on T Th Sat. Receiving dialysis today.  Plan: Continue cefepime 1 g IV q24h x 7 days and discontinued Vancomycin per CCM discussion.  Height: 6\' 3"  (190.5 cm) Weight: 186 lb 11.7 oz (84.7 kg) IBW/kg (Calculated) : 84.5  Temp (24hrs), Avg:98 F (36.7 C), Min:97.7 F (36.5 C), Max:98.3 F (36.8 C)  Recent Labs  Lab 01/15/18 0647 01/15/18 0653 01/15/18 1056 01/15/18 1622 01/16/18 0635 01/17/18 0552  WBC 16.4*  --   --   --  11.9* 14.9*  CREATININE 9.81*  --   --  6.02* 7.53* 8.56*  LATICACIDVEN  --  1.8 0.8  --   --   --     Estimated Creatinine Clearance: 11.2 mL/min (A) (by C-G formula based on SCr of 8.56 mg/dL (H)).    No Known Allergies  Antimicrobials this admission: 11/4 cefepime >> 11/10 11/4 vancomycin >> 11/6  Dose adjustments this admission: 11/4 Cefepime adjusted to 1 g q24h due to pt on HD.   Microbiology results:  11/5 BCx NG x 2 days 11/4 MRSA PCR: (+).   Thank you for allowing pharmacy to be a part of this patient's care.   Paticia Stack, PharmD Pharmacy Resident  01/17/2018 2:41 PM

## 2018-01-17 NOTE — Progress Notes (Signed)
Post HD assessment    01/17/18 1430  Neurological  Level of Consciousness Alert  Orientation Level Oriented X4  Respiratory  Respiratory Pattern Regular;Unlabored  Chest Assessment Chest expansion symmetrical  Cardiac  Pulse Irregular  ECG Monitor Yes  Cardiac Rhythm NSR  Ectopy Unifocal PVC's  Ectopy Frequency Occasional  Vascular  R Radial Pulse +2  L Radial Pulse +2  Edema Generalized  Integumentary  Integumentary (WDL) X  Skin Color Appropriate for ethnicity  Musculoskeletal  Musculoskeletal (WDL) X  Generalized Weakness Yes  Assistive Device None  GU Assessment  Genitourinary (WDL) X  Genitourinary Symptoms  (HD)  Psychosocial  Psychosocial (WDL) WDL

## 2018-01-17 NOTE — Progress Notes (Signed)
HD tx start    01/17/18 1050  Vital Signs  Pulse Rate 86  Pulse Rate Source Monitor  Resp 15  BP (!) 149/98  BP Location Left Arm  BP Method Automatic  Patient Position (if appropriate) Lying  Oxygen Therapy  SpO2 92 %  O2 Device Room Air  During Hemodialysis Assessment  Blood Flow Rate (mL/min) 400 mL/min  Arterial Pressure (mmHg) -140 mmHg  Venous Pressure (mmHg) 150 mmHg  Transmembrane Pressure (mmHg) 60 mmHg  Ultrafiltration Rate (mL/min) 1000 mL/min  Dialysate Flow Rate (mL/min) 800 ml/min  Conductivity: Machine  14  HD Safety Checks Performed Yes  Dialysis Fluid Bolus Normal Saline  Bolus Amount (mL) 250 mL  Intra-Hemodialysis Comments Tx initiated  Hemodialysis Catheter Right Internal jugular Double-lumen  No Placement Date or Time found.   Placed prior to admission: Yes  Orientation: Right  Access Location: Internal jugular  Hemodialysis Catheter Type: Double-lumen  Blue Lumen Status Infusing  Red Lumen Status Infusing

## 2018-01-17 NOTE — Progress Notes (Signed)
Pre HD assessment   01/17/18 1043  Vital Signs  Temp 98.3 F (36.8 C)  Temp Source Oral  Pulse Rate 87  Pulse Rate Source Monitor  Resp 16  BP (!) 152/104  BP Location Left Arm  BP Method Automatic  Patient Position (if appropriate) Lying  Oxygen Therapy  SpO2 92 %  O2 Device Room Air  Pain Assessment  Pain Scale 0-10  Pain Score Asleep  Dialysis Weight  Weight 88.3 kg  Type of Weight Pre-Dialysis  Time-Out for Hemodialysis  What Procedure? HD  Pt Identifiers(min of two) First/Last Name;MRN/Account#  Correct Site? Yes  Correct Side? Yes  Correct Procedure? Yes  Consents Verified? Yes  Rad Studies Available? N/A  Safety Precautions Reviewed? Yes  Engineer, civil (consulting) Number  (1A)  Station Number 3  UF/Alarm Test Passed  Conductivity: Meter 13.8  Conductivity: Machine  13.9  pH 7.4  Reverse Osmosis main  Normal Saline Lot Number [id number]  Dialyzer Lot Number [id number]  Disposable Set Lot Number [id number]  Machine Temperature 98.6 F (37 C)  Musician and Audible Yes  Blood Lines Intact and Secured Yes  Pre Treatment Patient Checks  Vascular access used during treatment Catheter  Hepatitis B Surface Antigen Results Negative  Date Hepatitis B Surface Antigen Drawn 06/16/17  Hepatitis B Surface Antibody  (>10)  Date Hepatitis B Surface Antibody Drawn 06/16/17  Hemodialysis Consent Verified Yes  Hemodialysis Standing Orders Initiated Yes  ECG (Telemetry) Monitor On Yes  Prime Ordered Normal Saline  Length of  DialysisTreatment -hour(s) 3.5 Hour(s)  Dialyzer Elisio 17H NR  Dialysate 2K, 2.5 Ca  Dialysis Anticoagulant None  Dialysate Flow Ordered 800  Blood Flow Rate Ordered 400 mL/min  Ultrafiltration Goal 3 Liters  Pre Treatment Labs Other (Comment) (vancomycin random)  Dialysis Blood Pressure Support Ordered Normal Saline  Education / Care Plan  Dialysis Education Provided Yes  Documented Education in Care Plan Yes  Hemodialysis Catheter Right  Internal jugular Double-lumen  No Placement Date or Time found.   Placed prior to admission: Yes  Orientation: Right  Access Location: Internal jugular  Hemodialysis Catheter Type: Double-lumen  Site Condition No complications  Blue Lumen Status Heparin locked  Red Lumen Status Heparin locked  Purple Lumen Status N/A  Dressing Type Biopatch  Dressing Status Clean;Dry;Intact  Drainage Description None

## 2018-01-17 NOTE — Progress Notes (Signed)
Post HD assessment. Pt tolerated tx well without c/o or complication. Net UF 3007, goal met. Pt will leave dialysis and go directly to ultrasound for procedure, MD/RN aware.    01/17/18 1431  Vital Signs  Temp 98.2 F (36.8 C)  Temp Source Oral  Pulse Rate 86  Pulse Rate Source Monitor  Resp (!) 23  BP 120/66  BP Location Left Arm  BP Method Automatic  Patient Position (if appropriate) Lying  Oxygen Therapy  SpO2 95 %  O2 Device Nasal Cannula  O2 Flow Rate (L/min) 2 L/min  Dialysis Weight  Weight 84.7 kg  Type of Weight Post-Dialysis  Post-Hemodialysis Assessment  Rinseback Volume (mL) 250 mL  KECN 77.1 V  Dialyzer Clearance Lightly streaked  Duration of HD Treatment -hour(s) 3.5 hour(s)  Hemodialysis Intake (mL) 500 mL  UF Total -Machine (mL) 3507 mL  Net UF (mL) 3007 mL  Tolerated HD Treatment Yes  Education / Care Plan  Dialysis Education Provided Yes  Documented Education in Care Plan Yes  Hemodialysis Catheter Right Internal jugular Double-lumen  No Placement Date or Time found.   Placed prior to admission: Yes  Orientation: Right  Access Location: Internal jugular  Hemodialysis Catheter Type: Double-lumen  Site Condition No complications  Blue Lumen Status Heparin locked  Red Lumen Status Heparin locked  Purple Lumen Status N/A  Catheter fill solution Heparin 1000 units/ml  Catheter fill volume (Arterial) 1.5 cc  Catheter fill volume (Venous) 1.5  Dressing Type Biopatch  Dressing Status Clean;Dry;Intact  Drainage Description None  Post treatment catheter status Capped and Clamped

## 2018-01-17 NOTE — Progress Notes (Signed)
Family Meeting Note  Advance Directive:yes  Today a meeting took place with the Patient.  The following clinical team members were present during this meeting:MD  The following were discussed:Patient's diagnosis: ESRD on HD, non compliance, Htn, Liver cirrhosis , Patient's progosis: Unable to determine and Goals for treatment: Full Code  Additional follow-up to be provided: Nephrology  Time spent during discussion:20 minutes  Vaughan Basta, MD

## 2018-01-17 NOTE — Progress Notes (Signed)
[facility] [place], [place] 01/17/18  Subjective:   Patient still has a cough.  Diagnosed with right middle lobe infiltrate on chest x-ray.  Broad-spectrum antibiotics for pneumonia.  Also getting treatment for bronchospasm States he is unable to bring up phlegm    Objective:  Vital signs in last 24 hours:  Temp:  [97.7 F (36.5 C)-98.1 F (36.7 C)] 97.9 F (36.6 C) (11/06 0801) Pulse Rate:  [74-87] 83 (11/06 0801) Resp:  [11-21] 21 (11/06 0801) BP: (122-143)/(71-97) 125/89 (11/06 0800) SpO2:  [91 %-100 %] 97 % (11/06 0801)  Weight change:  Filed Weights   01/15/18 0953 01/15/18 1352 01/15/18 1438  Weight: 78 kg 75 kg 81.6 kg    Intake/Output:    Intake/Output Summary (Last 24 hours) at 01/17/2018 0928 Last data filed at 01/17/2018 0600 Gross per 24 hour  Intake 680.89 ml  Output 250 ml  Net 430.89 ml     Physical Exam: General:  Chronically ill-appearing, laying in the bed  HEENT  moist oral mucous membranes  Neck  distended neck veins  Pulm/lungs  bilateral crackles, nasal cannula oxygen  CVS/Heart  regular, no rub  Abdomen:   Severely distended, ascites  Extremities:  trace lower extremity edema bilaterally  Neurologic:  Alert and oriented  Access:  Right IJ PermCath       Basic Metabolic Panel:  Recent Labs  Lab 01/15/18 0647 01/15/18 1056 01/15/18 1622 01/16/18 0635 01/17/18 0552  NA 141  --  139 140 138  K 6.1*  --  4.7 5.5* 6.0*  CL 102  --  101 101 100  CO2 25  --  28 27 24   GLUCOSE 102*  --  203* 171* 140*  BUN 57*  --  28* 42* 63*  CREATININE 9.81*  --  6.02* 7.53* 8.56*  CALCIUM 8.4*  --  8.3* 8.5* 8.6*  MG  --   --   --   --  2.2  PHOS  --  6.8*  --   --  8.6*     CBC: Recent Labs  Lab 01/15/18 0647 01/16/18 0635 01/17/18 0552  WBC 16.4* 11.9* 14.9*  NEUTROABS 14.0*  --   --   HGB 10.0* 9.4* 10.0*  HCT 35.7* 33.9* 36.0*  MCV 87.7 88.1 87.4  PLT 346 317 366      Lab Results  Component Value Date    HEPBSAG Negative 06/16/2017   HEPBSAB Reactive 03/01/2017   HEPBIGM Negative 06/01/2016      Microbiology:  Recent Results (from the past 240 hour(s))  MRSA PCR Screening     Status: Abnormal   Collection Time: 01/15/18  8:39 AM  Result Value Ref Range Status   MRSA by PCR POSITIVE (A) NEGATIVE Final    Comment:        The GeneXpert MRSA Assay (FDA approved for NASAL specimens only), is one component of a comprehensive MRSA colonization surveillance program. It is not intended to diagnose MRSA infection nor to guide or monitor treatment for MRSA infections. RESULT CALLED TO, READ BACK BY AND VERIFIED WITH: BETH BUONO 01/15/18 1905 REC Performed at [facility], [address] [place], [place] [address]   Culture, blood (Routine X 2) w Reflex to ID Panel     Status: None (Preliminary result)   Collection Time: 01/15/18 11:13 AM  Result Value Ref Range Status   Specimen Description BLOOD COLLECT BY DIALYSIS PORT  Final   Special Requests   Final  BOTTLES DRAWN AEROBIC AND ANAEROBIC Blood Culture results may not be optimal due to an excessive volume of blood received in culture bottles   Culture   Final    NO GROWTH 2 DAYS Performed at [facility], [address], [place], [place] [address]    Report Status PENDING  Incomplete  Culture, blood (Routine X 2) w Reflex to ID Panel     Status: None (Preliminary result)   Collection Time: 01/15/18 11:14 AM  Result Value Ref Range Status   Specimen Description BLOOD COLLECTED BY DIALYSIS PORT  Final   Special Requests   Final    BOTTLES DRAWN AEROBIC AND ANAEROBIC Blood Culture results may not be optimal due to an excessive volume of blood received in culture bottles   Culture   Final    NO GROWTH 2 DAYS Performed at [facility], [address], [place], [place] [address]    Report Status PENDING  Incomplete    Coagulation Studies: No results for input(s): LABPROT, INR in the last 72  hours.  Urinalysis: No results for input(s): COLORURINE, LABSPEC, PHURINE, GLUCOSEU, HGBUR, BILIRUBINUR, KETONESUR, PROTEINUR, UROBILINOGEN, NITRITE, LEUKOCYTESUR in the last 72 hours.  Invalid input(s): APPERANCEUR    Imaging: Dg Eye Foreign Body  Result Date: 01/16/2018 CLINICAL DATA:  Metal working/exposure; clearance prior to MRI EXAM: ORBITS FOR FOREIGN BODY - 2 VIEW COMPARISON:  None. FINDINGS: There is no evidence of metallic foreign body within the orbits. No significant bone abnormality identified. The observed paranasal sinuses are clear. IMPRESSION: No evidence of metallic foreign body within the orbits. Electronically Signed   By: David  Martinique M.D.   On: 01/16/2018 13:38   Dg Chest 2 View  Result Date: 01/16/2018 CLINICAL DATA:  Pneumonia. EXAM: CHEST - 2 VIEW COMPARISON:  Radiograph of January 15, 2018. FINDINGS: Stable cardiomegaly. Atherosclerosis of thoracic aorta is noted. Sternotomy wires are noted. Right internal jugular dialysis catheter is unchanged in position. No pneumothorax or pleural effusion is noted. No consolidative process is seen currently. Bony thorax is unremarkable. IMPRESSION: No acute consolidative process is noted. Aortic Atherosclerosis (ICD10-I70.0). Electronically Signed   By: Marijo Conception, M.D.   On: 01/16/2018 07:59   Dg Abd 1 View  Result Date: 01/16/2018 CLINICAL DATA:  Abdominal distention. EXAM: ABDOMEN - 1 VIEW COMPARISON:  None. FINDINGS: Gaseous distention of the stomach with suggestion of gastric fold thickening. This may indicate gastroparesis and/or gastritis. Scattered gas and stool in the colon. No small or large bowel distention. No radiopaque stones. Vascular calcifications with iliac stents. Visualized bones appear intact. No radiopaque soft tissue foreign bodies. IMPRESSION: Gaseous distention of the stomach with suggestion of gastric fold thickening. Changes may represent gastroparesis and/or gastritis. Electronically Signed   By:  Lucienne Capers M.D.   On: 01/16/2018 22:06   Ct Head Wo Contrast  Result Date: 01/15/2018 CLINICAL DATA:  57 y/o M; episode of altered mental status while eating dinner. EXAM: CT HEAD WITHOUT CONTRAST TECHNIQUE: Contiguous axial images were obtained from the base of the skull through the vertex without intravenous contrast. COMPARISON:  07/09/2013 CT head FINDINGS: Brain: No evidence of acute infarction, hemorrhage, hydrocephalus, extra-axial collection or mass lesion/mass effect. Stable chronic microvascular ischemic changes and volume loss of the brain. Stable small chronic lacunar infarcts within the right caudate head, right putamen, and left anterior limb of internal capsule. Vascular: Calcific atherosclerosis of the carotid siphons. No hyperdense vessel identified. Skull: Normal. Negative for fracture or focal lesion. Sinuses/Orbits: No  acute finding. Other: None. IMPRESSION: 1. No acute intracranial abnormality identified. 2. Stable chronic microvascular ischemic changes, brain volume loss, and chronic lacunar infarcts of basal ganglia. Electronically Signed   By: Kristine Garbe M.D.   On: 01/15/2018 18:23   Mr Brain Wo Contrast  Result Date: 01/16/2018 CLINICAL DATA:  57 y/o M; right facial numbness and right-sided weakness. EXAM: MRI HEAD WITHOUT CONTRAST TECHNIQUE: Multiplanar, multiecho pulse sequences of the brain and surrounding structures were obtained without intravenous contrast. COMPARISON:  01/15/2018 CT head. FINDINGS: Brain: No acute infarction, hemorrhage, hydrocephalus, extra-axial collection or mass lesion. Small chronic infarcts are present within the right caudate head, right lentiform nucleus, left anterior limb of internal capsule, and right paramedian pons. Moderate volume loss of the brain for age. Vascular: Normal flow voids. Skull and upper cervical spine: Normal marrow signal. Sinuses/Orbits: Negative. Other: None. IMPRESSION: 1. No acute intracranial abnormality  identified. 2. Stable chronic infarcts within the basal ganglia and volume loss of the brain. Electronically Signed   By: Kristine Garbe M.D.   On: 01/16/2018 22:59     Medications:   . sodium chloride 10 mL/hr at 01/16/18 1600  . ceFEPime (MAXIPIME) IV Stopped (01/16/18 3295)  . vancomycin Stopped (01/16/18 1528)   . calcium acetate  2,001 mg Oral TID  . Chlorhexidine Gluconate Cloth  6 each Topical Q0600  . epoetin (EPOGEN/PROCRIT) injection  4,000 Units Intravenous Q M,W,F-HD  . ferrous sulfate  325 mg Oral TID  . folic acid  1 mg Oral Daily  . gabapentin  300 mg Oral BID  . heparin  5,000 Units Subcutaneous Q8H  . labetalol  100 mg Oral Daily  . methylPREDNISolone (SOLU-MEDROL) injection  60 mg Intravenous Q6H  . metoCLOPramide (REGLAN) injection  5 mg Intravenous Q12H  . mometasone-formoterol  2 puff Inhalation BID  . multivitamin  1 tablet Oral QHS  . mupirocin ointment  1 application Nasal BID  . nicotine  21 mg Transdermal Daily  . pantoprazole  40 mg Oral Daily  . tiotropium  18 mcg Inhalation Daily     Assessment/ Plan:  57 y.o. 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  Hyperkalemia 2.  Volume overload with acute pulmonary edema 3.  Tense ascites 4.  End-stage renal disease 5.  Anemia chronic kidney disease 6.  Low-grade fever, elevated WBC  Plan: Urgent hemodialysis again today on low K bath Volume removal with hemodialysis Antibiotics as per primary team Monitor Phos, continue binders with meals EPO with HD     LOS: 2 Kayah Hecker 11/6/20199:28 AM  [place], [place]  Note: This note was prepared with Dragon dictation. Any transcription  errors are unintentional

## 2018-01-17 NOTE — Progress Notes (Signed)
[facility] [place] NAME: Stanley [name]    MR#:  [id number]  DATE OF BIRTH:  06-12-1960  SUBJECTIVE:  CHIEF COMPLAINT:   Chief Complaint  Patient presents with  . Respiratory Distress    Much improved respi status after HD. Off bipap. Concern for altered mental status today. More oriented in afternoon when I saw him.  REVIEW OF SYSTEMS:  CONSTITUTIONAL: No fever, fatigue or weakness.  EYES: No blurred or double vision.  EARS, NOSE, AND THROAT: No tinnitus or ear pain.  RESPIRATORY: No cough, shortness of breath, wheezing or hemoptysis.  CARDIOVASCULAR: No chest pain, orthopnea, edema.  GASTROINTESTINAL: No nausea, vomiting, diarrhea or abdominal pain.  GENITOURINARY: No dysuria, hematuria.  ENDOCRINE: No polyuria, nocturia,  HEMATOLOGY: No anemia, easy bruising or bleeding SKIN: No rash or lesion. MUSCULOSKELETAL: No joint pain or arthritis.   NEUROLOGIC: No tingling, numbness, weakness.  PSYCHIATRY: No anxiety or depression.   ROS  DRUG ALLERGIES:  No Known Allergies  VITALS:  Blood pressure 133/89, pulse 74, temperature 98.1 F (36.7 C), temperature source Oral, resp. rate 14, height 6\' 3"  (1.905 m), weight 81.6 kg, SpO2 91 %.  PHYSICAL EXAMINATION:  GENERAL:  57 y.o.-year-old patient lying in the bed with no acute distress.  EYES: Pupils equal, round, reactive to light and accommodation. No scleral icterus. Extraocular muscles intact.  HEENT: Head atraumatic, normocephalic. Oropharynx and nasopharynx clear.  NECK:  Supple, no jugular venous distention. No thyroid enlargement, no tenderness.  LUNGS: Normal breath sounds bilaterally, no wheezing, mild crepitation. No use of accessory muscles of respiration.  CARDIOVASCULAR: S1, S2 normal. No murmurs, rubs, or gallops.  ABDOMEN: Soft, nontender, nondistended. Bowel sounds present. No organomegaly or mass.  EXTREMITIES: No pedal edema, cyanosis, or clubbing.  NEUROLOGIC:  Cranial nerves II through XII are intact. Muscle strength 4/5 in all extremities. Sensation intact. Gait not checked.  PSYCHIATRIC: The patient is alert and oriented x 3.  SKIN: No obvious rash, lesion, or ulcer.   Physical Exam LABORATORY PANEL:   CBC Recent Labs  Lab 01/17/18 0552  WBC 14.9*  HGB 10.0*  HCT 36.0*  PLT 366   ------------------------------------------------------------------------------------------------------------------  Chemistries  Recent Labs  Lab 01/17/18 0552  NA 138  K 6.0*  CL 100  CO2 24  GLUCOSE 140*  BUN 63*  CREATININE 8.56*  CALCIUM 8.6*  MG 2.2   ------------------------------------------------------------------------------------------------------------------  Cardiac Enzymes Recent Labs  Lab 01/15/18 0647  TROPONINI 0.11*   ------------------------------------------------------------------------------------------------------------------  RADIOLOGY:  Dg Eye Foreign Body  Result Date: 01/16/2018 CLINICAL DATA:  Metal working/exposure; clearance prior to MRI EXAM: ORBITS FOR FOREIGN BODY - 2 VIEW COMPARISON:  None. FINDINGS: There is no evidence of metallic foreign body within the orbits. No significant bone abnormality identified. The observed paranasal sinuses are clear. IMPRESSION: No evidence of metallic foreign body within the orbits. Electronically Signed   By: David  Martinique M.D.   On: 01/16/2018 13:38   Dg Chest 2 View  Result Date: 01/16/2018 CLINICAL DATA:  Pneumonia. EXAM: CHEST - 2 VIEW COMPARISON:  Radiograph of January 15, 2018. FINDINGS: Stable cardiomegaly. Atherosclerosis of thoracic aorta is noted. Sternotomy wires are noted. Right internal jugular dialysis catheter is unchanged in position. No pneumothorax or pleural effusion is noted. No consolidative process is seen currently. Bony thorax is unremarkable. IMPRESSION: No acute consolidative process is noted. Aortic Atherosclerosis (ICD10-I70.0). Electronically Signed    By: Marijo Conception, M.D.   On: 01/16/2018 07:59  Dg Abd 1 View  Result Date: 01/16/2018 CLINICAL DATA:  Abdominal distention. EXAM: ABDOMEN - 1 VIEW COMPARISON:  None. FINDINGS: Gaseous distention of the stomach with suggestion of gastric fold thickening. This may indicate gastroparesis and/or gastritis. Scattered gas and stool in the colon. No small or large bowel distention. No radiopaque stones. Vascular calcifications with iliac stents. Visualized bones appear intact. No radiopaque soft tissue foreign bodies. IMPRESSION: Gaseous distention of the stomach with suggestion of gastric fold thickening. Changes may represent gastroparesis and/or gastritis. Electronically Signed   By: Lucienne Capers M.D.   On: 01/16/2018 22:06   Ct Head Wo Contrast  Result Date: 01/15/2018 CLINICAL DATA:  57 y/o M; episode of altered mental status while eating dinner. EXAM: CT HEAD WITHOUT CONTRAST TECHNIQUE: Contiguous axial images were obtained from the base of the skull through the vertex without intravenous contrast. COMPARISON:  07/09/2013 CT head FINDINGS: Brain: No evidence of acute infarction, hemorrhage, hydrocephalus, extra-axial collection or mass lesion/mass effect. Stable chronic microvascular ischemic changes and volume loss of the brain. Stable small chronic lacunar infarcts within the right caudate head, right putamen, and left anterior limb of internal capsule. Vascular: Calcific atherosclerosis of the carotid siphons. No hyperdense vessel identified. Skull: Normal. Negative for fracture or focal lesion. Sinuses/Orbits: No acute finding. Other: None. IMPRESSION: 1. No acute intracranial abnormality identified. 2. Stable chronic microvascular ischemic changes, brain volume loss, and chronic lacunar infarcts of basal ganglia. Electronically Signed   By: Kristine Garbe M.D.   On: 01/15/2018 18:23   Mr Brain Wo Contrast  Result Date: 01/16/2018 CLINICAL DATA:  57 y/o M; right facial numbness and  right-sided weakness. EXAM: MRI HEAD WITHOUT CONTRAST TECHNIQUE: Multiplanar, multiecho pulse sequences of the brain and surrounding structures were obtained without intravenous contrast. COMPARISON:  01/15/2018 CT head. FINDINGS: Brain: No acute infarction, hemorrhage, hydrocephalus, extra-axial collection or mass lesion. Small chronic infarcts are present within the right caudate head, right lentiform nucleus, left anterior limb of internal capsule, and right paramedian pons. Moderate volume loss of the brain for age. Vascular: Normal flow voids. Skull and upper cervical spine: Normal marrow signal. Sinuses/Orbits: Negative. Other: None. IMPRESSION: 1. No acute intracranial abnormality identified. 2. Stable chronic infarcts within the basal ganglia and volume loss of the brain. Electronically Signed   By: Kristine Garbe M.D.   On: 01/16/2018 22:59    ASSESSMENT AND PLAN:   Active Problems:   Acute respiratory failure with hypoxia (HCC)   Healthcare-associated pneumonia   Acute on chronic respiratory failure (HCC)  *Acute on chronic respiratory failure with hypoxia Secondary to pulmonary edema due to noncompliance with hemodialysis Also healthcare associated pneumonia  Improved,   Required BiPAP, admit to stepdown unit. IV broad-spectrum antibiotics for now. ICU physician is contacted for further management and nephrologist is contacted for the need of hemodialysis.  Improved after HD, off bipap  *Sepsis Healthcare associated pneumonia  Continue broad-spectrum antibiotics, lactic acid, procalcitonin, blood cultures are sent.  *COPD exacerbation IV steroid and nebulizer treatment Supplemental oxygen and BiPAP as above.  On nasal canula oxygen now.  *Acute on chronic diastolic congestive heart failure Due to pulmonary edema and noncompliance to dialysis Hemodialysis needed, nephrologist is aware.  * Altered mental status   Appreciated consult neurologist   MRI, EEG,  HBA1c, Lipid panel    Pt improved now in afternoon during my exam.    *[name] Hyperkalemia urgent hemodialysis, appreciated help by nephrologist.  *Chronic anemia due to renal failure  Continue to monitor.  *Liver cirrhosis and ascites Once more stable, may need a sciatic tap.  *Active smoking Counseled to quit smoking for 4 minutes and offered nicotine patch.   All the records are reviewed and case discussed with Care Management/Social Workerr. Management plans discussed with the patient, family and they are in agreement.  CODE STATUS: Full  TOTAL TIME TAKING CARE OF THIS PATIENT: 35 minutes.     POSSIBLE D/C IN 1-2 DAYS, DEPENDING ON CLINICAL CONDITION.   Vaughan Basta M.D on 01/17/2018   Between 7am to 6pm - Pager - [phone]  After 6pm go to [email or web address] - password [email or web address] [facility]  Office  [phone]  CC: Primary care physician; [name], MD  Note: This dictation was prepared with Dragon dictation along with smaller phrase technology. Any transcriptional errors that result from this process are unintentional.

## 2018-01-17 NOTE — Progress Notes (Signed)
Pre HD assessment    01/17/18 1044  Neurological  Level of Consciousness Alert  Orientation Level Oriented X4  Respiratory  Respiratory Pattern Regular;Unlabored  Chest Assessment Chest expansion symmetrical  Cardiac  ECG Monitor Yes  Cardiac Rhythm NSR  Ectopy Unifocal PVC's  Ectopy Frequency Occasional  Vascular  R Radial Pulse +2  L Radial Pulse +2  Integumentary  Integumentary (WDL) X  Skin Color Appropriate for ethnicity  Musculoskeletal  Musculoskeletal (WDL) X  Generalized Weakness Yes  Assistive Device None  GU Assessment  Genitourinary (WDL) X  Genitourinary Symptoms  (HD)  Psychosocial  Psychosocial (WDL) WDL

## 2018-01-17 NOTE — Progress Notes (Signed)
Follow up - Critical Care Medicine Note  Patient Details:    Stanley [name] is an 57 y.o. male.with a past medical history remarkable for congestive heart failure, cirrhosis, COPD, alcohol use, prior history of drug abuse, coronary artery disease, peripheral arterial disease, hypertension, hyperlipidemia, gastroesophageal reflux disease, end-stage renal disease on Tuesday Thursday Saturday hemodialysis, presented from home with acute onset of shortness of breath. He was initially started on BiPAP in the emergency department and was also noted to be hyperkalemic with potassium of 6.1.  He was taken for emergent hemodialysis and arrives in the intensive care unit on nasal cannula.    Lines, Airways, Drains:    Anti-infectives:  Anti-infectives (From admission, onward)   Start     Dose/Rate Route Frequency Ordered Stop   01/16/18 1200  vancomycin (VANCOCIN) IVPB 750 mg/150 ml premix     750 mg 150 mL/hr over 60 Minutes Intravenous Every T-Th-Sa (Hemodialysis) 01/15/18 0842     01/16/18 1000  ceFEPIme (MAXIPIME) 1 g in sodium chloride 0.9 % 100 mL IVPB     1 g 200 mL/hr over 30 Minutes Intravenous Every 24 hours 01/15/18 1604     01/15/18 1600  vancomycin (VANCOCIN) IVPB 750 mg/150 ml premix     750 mg 150 mL/hr over 60 Minutes Intravenous  Once 01/15/18 0842 01/15/18 1851   01/15/18 0900  ceFEPIme (MAXIPIME) 1 g in sodium chloride 0.9 % 100 mL IVPB  Status:  Discontinued     1 g 200 mL/hr over 30 Minutes Intravenous Every 24 hours 01/15/18 0835 01/15/18 1604   01/15/18 0845  vancomycin (VANCOCIN) IVPB 1000 mg/200 mL premix     1,000 mg 200 mL/hr over 60 Minutes Intravenous  Once 01/15/18 1017 01/15/18 1858      Microbiology: Results for orders placed or performed during the hospital encounter of 01/15/18  MRSA PCR Screening     Status: Abnormal   Collection Time: 01/15/18  8:39 AM  Result Value Ref Range Status   MRSA by PCR POSITIVE (A) NEGATIVE Final    Comment:        The  GeneXpert MRSA Assay (FDA approved for NASAL specimens only), is one component of a comprehensive MRSA colonization surveillance program. It is not intended to diagnose MRSA infection nor to guide or monitor treatment for MRSA infections. RESULT CALLED TO, READ BACK BY AND VERIFIED WITH: BETH BUONO 01/15/18 1905 REC Performed at [facility], [address] [place], [place] [address]   Culture, blood (Routine X 2) w Reflex to ID Panel     Status: None (Preliminary result)   Collection Time: 01/15/18 11:13 AM  Result Value Ref Range Status   Specimen Description BLOOD COLLECT BY DIALYSIS PORT  Final   Special Requests   Final    BOTTLES DRAWN AEROBIC AND ANAEROBIC Blood Culture results may not be optimal due to an excessive volume of blood received in culture bottles   Culture   Final    NO GROWTH 2 DAYS Performed at [facility], [address], [place], [place] [address]    Report Status PENDING  Incomplete  Culture, blood (Routine X 2) w Reflex to ID Panel     Status: None (Preliminary result)   Collection Time: 01/15/18 11:14 AM  Result Value Ref Range Status   Specimen Description BLOOD COLLECTED BY DIALYSIS PORT  Final   Special Requests   Final    BOTTLES DRAWN AEROBIC AND ANAEROBIC Blood Culture results may not be  optimal due to an excessive volume of blood received in culture bottles   Culture   Final    NO GROWTH 2 DAYS Performed at [facility], [address] [place], [place] [address]    Report Status PENDING  Incomplete     Studies: Dg Eye Foreign Body  Result Date: 01/16/2018 CLINICAL DATA:  Metal working/exposure; clearance prior to MRI EXAM: ORBITS FOR FOREIGN BODY - 2 VIEW COMPARISON:  None. FINDINGS: There is no evidence of metallic foreign body within the orbits. No significant bone abnormality identified. The observed paranasal sinuses are clear. IMPRESSION: No evidence of metallic foreign body within the orbits.  Electronically Signed   By: David  Martinique M.D.   On: 01/16/2018 13:38   Dg Chest 2 View  Result Date: 01/16/2018 CLINICAL DATA:  Pneumonia. EXAM: CHEST - 2 VIEW COMPARISON:  Radiograph of January 15, 2018. FINDINGS: Stable cardiomegaly. Atherosclerosis of thoracic aorta is noted. Sternotomy wires are noted. Right internal jugular dialysis catheter is unchanged in position. No pneumothorax or pleural effusion is noted. No consolidative process is seen currently. Bony thorax is unremarkable. IMPRESSION: No acute consolidative process is noted. Aortic Atherosclerosis (ICD10-I70.0). Electronically Signed   By: Marijo Conception, M.D.   On: 01/16/2018 07:59   Dg Chest 2 View  Result Date: 12/26/2017 CLINICAL DATA:  Fever.  Pulmonary edema. EXAM: CHEST - 2 VIEW COMPARISON:  12/23/2017. FINDINGS: Dual-lumen catheter with tip over the superior vena cava. Prior CABG. Cardiomegaly with pulmonary vascular prominence and bilateral interstitial prominence suggesting mild CHF. Interim improvement from prior exam. No pleural effusion. No pneumothorax. No acute bony abnormality. IMPRESSION: 1.  Dual-lumen catheter stable position. 2. Prior CABG. Cardiomegaly with pulmonary venous congestion bilateral interstitial prominence consistent with CHF. Interim improvement from prior exam. Electronically Signed   By: Marcello Moores  Register   On: 12/26/2017 09:07   Dg Chest 2 View  Result Date: 12/23/2017 CLINICAL DATA:  Shortness of breath. EXAM: CHEST - 2 VIEW COMPARISON:  Chest x-ray dated 12/09/2017. FINDINGS: Stable cardiomegaly. Dialysis catheter is stable in position with tip at the level of the mid SVC. Mildly prominent interstitial markings noted bilaterally, slightly more prominent than on previous studies suggesting mild interstitial edema related to CHF. No confluent opacity to suggest pneumonia or alveolar pulmonary edema. No pleural effusion or pneumothorax seen. IMPRESSION: 1. Mild interstitial edema indicating mild  CHF/volume overload. 2. Stable cardiomegaly. Electronically Signed   By: Franki Cabot M.D.   On: 12/23/2017 18:59   Dg Abd 1 View  Result Date: 01/16/2018 CLINICAL DATA:  Abdominal distention. EXAM: ABDOMEN - 1 VIEW COMPARISON:  None. FINDINGS: Gaseous distention of the stomach with suggestion of gastric fold thickening. This may indicate gastroparesis and/or gastritis. Scattered gas and stool in the colon. No small or large bowel distention. No radiopaque stones. Vascular calcifications with iliac stents. Visualized bones appear intact. No radiopaque soft tissue foreign bodies. IMPRESSION: Gaseous distention of the stomach with suggestion of gastric fold thickening. Changes may represent gastroparesis and/or gastritis. Electronically Signed   By: Lucienne Capers M.D.   On: 01/16/2018 22:06   Ct Head Wo Contrast  Result Date: 01/15/2018 CLINICAL DATA:  57 y/o M; episode of altered mental status while eating dinner. EXAM: CT HEAD WITHOUT CONTRAST TECHNIQUE: Contiguous axial images were obtained from the base of the skull through the vertex without intravenous contrast. COMPARISON:  07/09/2013 CT head FINDINGS: Brain: No evidence of acute infarction, hemorrhage, hydrocephalus, extra-axial collection or mass lesion/mass effect. Stable chronic microvascular  ischemic changes and volume loss of the brain. Stable small chronic lacunar infarcts within the right caudate head, right putamen, and left anterior limb of internal capsule. Vascular: Calcific atherosclerosis of the carotid siphons. No hyperdense vessel identified. Skull: Normal. Negative for fracture or focal lesion. Sinuses/Orbits: No acute finding. Other: None. IMPRESSION: 1. No acute intracranial abnormality identified. 2. Stable chronic microvascular ischemic changes, brain volume loss, and chronic lacunar infarcts of basal ganglia. Electronically Signed   By: Kristine Garbe M.D.   On: 01/15/2018 18:23   Mr Brain Wo Contrast  Result  Date: 01/16/2018 CLINICAL DATA:  57 y/o M; right facial numbness and right-sided weakness. EXAM: MRI HEAD WITHOUT CONTRAST TECHNIQUE: Multiplanar, multiecho pulse sequences of the brain and surrounding structures were obtained without intravenous contrast. COMPARISON:  01/15/2018 CT head. FINDINGS: Brain: No acute infarction, hemorrhage, hydrocephalus, extra-axial collection or mass lesion. Small chronic infarcts are present within the right caudate head, right lentiform nucleus, left anterior limb of internal capsule, and right paramedian pons. Moderate volume loss of the brain for age. Vascular: Normal flow voids. Skull and upper cervical spine: Normal marrow signal. Sinuses/Orbits: Negative. Other: None. IMPRESSION: 1. No acute intracranial abnormality identified. 2. Stable chronic infarcts within the basal ganglia and volume loss of the brain. Electronically Signed   By: Kristine Garbe M.D.   On: 01/16/2018 22:59   US Paracentesis  Result Date: 12/25/2017 INDICATION: Recurrent ascites, renal failure, abdominal distension EXAM: ULTRASOUND GUIDED left PARACENTESIS MEDICATIONS: 1% lidocaine local COMPLICATIONS: None immediate. PROCEDURE: Informed written consent was obtained from the patient after a discussion of the risks, benefits and alternatives to treatment. A timeout was performed prior to the initiation of the procedure. Initial ultrasound scanning demonstrates a large amount of ascites within the left lower abdominal quadrant. The right lower abdomen was prepped and draped in the usual sterile fashion. 1% lidocaine with epinephrine was used for local anesthesia. Following this, a 6 Fr Safe-T-Centesis catheter was introduced. An ultrasound image was saved for documentation purposes. The paracentesis was performed. The catheter was removed and a dressing was applied. The patient tolerated the procedure well without immediate post procedural complication. FINDINGS: A total of approximately 6.75  of clear peritoneal fluid was removed. Sample was not sent for laboratory analysis IMPRESSION: Successful ultrasound-guided paracentesis yielding 6.75 liters of peritoneal fluid. Electronically Signed   By: Jerilynn Mages.  Shick M.D.   On: 12/25/2017 10:58   Dg Chest Port 1 View  Result Date: 01/15/2018 CLINICAL DATA:  Respiratory distress. Dialysis patient. Shortness of breath. EXAM: PORTABLE CHEST 1 VIEW COMPARISON:  12/26/2017 FINDINGS: Sternotomy wires and right IJ dialysis catheter unchanged. Lungs are adequately inflated with minimal stable prominence of the perihilar markings likely mild vascular congestion. There is patchy density over the right mid to lower lung which may be due to atelectasis or infection. No effusion. Mild stable cardiomegaly. Remainder the exam is unchanged. IMPRESSION: Patchy density over the right mid to lower lung which may be due to atelectasis or infection. Mild cardiomegaly with mild stable vascular congestion. Electronically Signed   By: Marin Olp M.D.   On: 01/15/2018 07:14    Consults: Treatment Team:  Alexis Goodell, MD Murlean Iba, MD   Subjective:    Overnight Issues: Over last night he had some intermittent issues of altered level of consciousness.  CT scan of the head was negative, pending results of MRI.  Concern for possible seizure activity nonconvulsive.  Pending EEG and neurology consultation  Objective:  Vital signs for last  24 hours: Temp:  [97.7 F (36.5 C)-98.1 F (36.7 C)] 97.9 F (36.6 C) (11/06 0801) Pulse Rate:  [74-87] 83 (11/06 0801) Resp:  [11-21] 21 (11/06 0801) BP: (122-143)/(71-97) 125/89 (11/06 0800) SpO2:  [91 %-100 %] 97 % (11/06 0801)  Hemodynamic parameters for last 24 hours:    Intake/Output from previous day: 11/05 0701 - 11/06 0700 In: 1135.5 [P.O.:827; I.V.:20.4; IV Piggyback:288.2] Out: 250 [Urine:250]  Intake/Output this shift: No intake/output data recorded.  Vent settings for last 24 hours:    Physical  Exam:   General:          Patient is awake alert and in no acute distress, presently eating, on nasal cannula with stable hemodynamics HEENT:           Trachea midline, no oral lesions noted, mild jugular venous distention is appreciated no thyromegaly noted Cardiovascular:           Regular rate and rhythm Pulmonary:      Prolonged expiratory phase, diffuse rhonchi right greater than left Abdominal:      Positive bowel sounds, soft exam Extremities:     No clubbing, cyanosis or edema noted Neurologic:      Patient was all extremities, no focal deficits appreciated Cutaneous:      No rashes or lesions noted  Assessment/Plan:   Respiratory failure.  Patient is now status post hemodialysis, 3 L were removed, has been successfully weaned off of BiPAP presently on nasal cannula with stable hemodynamics.  Patient does have evidence of a right middle to lower lobe infiltrate on his chest x-ray, febrile and white count.  Agree with empiric broad-spectrum antibiotic coverage.  He also has evidence of bronchospasm on exam, agree with Solu-Medrol, albuterol, Atrovent and inhaled steroid.  Hyperkalemia.  Status post hemodialysis, this morning's level 6 appreciate nephrology assistance  Ascites.  Patient in need of paracentesis per IR  Leukocytosis.  On vancomycin and cefepime if MRSA screen is negative we will stop vancomycin and continue cefepime for 7 days  Anemia.  No evidence of active bleeding  Intermittent altered level of consciousness unclear etiology.  Results of EGD and CT noted   Hermelinda Dellen, DO   Naydene Kamrowski 01/17/2018  *Care during the described time interval was provided by me and/or other providers on the critical care team.  I have reviewed this patient's available data, including medical history, events of note, physical examination and test results as part of my evaluation. Patient ID: [name], male   DOB: [birth year], 57 y.o.   MRN: [id number]

## 2018-01-18 LAB — BASIC METABOLIC PANEL
ANION GAP: 12 (ref 5–15)
BUN: 46 mg/dL — AB (ref 6–20)
CO2: 26 mmol/L (ref 22–32)
Calcium: 8.4 mg/dL — ABNORMAL LOW (ref 8.9–10.3)
Chloride: 98 mmol/L (ref 98–111)
Creatinine, Ser: 7.07 mg/dL — ABNORMAL HIGH (ref 0.61–1.24)
GFR calc Af Amer: 9 mL/min — ABNORMAL LOW (ref >60)
GFR calc non Af Amer: 8 mL/min — ABNORMAL LOW (ref >60)
GLUCOSE: 236 mg/dL — AB (ref 70–99)
POTASSIUM: 4.7 mmol/L (ref 3.5–5.1)
Sodium: 136 mmol/L (ref 135–145)

## 2018-01-18 MED ORDER — DIPHENHYDRAMINE HCL 25 MG PO CAPS
25.0000 mg | ORAL_CAPSULE | Freq: Once | ORAL | Status: AC
  Administered 2018-01-18: 25 mg via ORAL

## 2018-01-18 MED ORDER — HYDROCODONE-ACETAMINOPHEN 5-325 MG PO TABS
1.0000 | ORAL_TABLET | Freq: Four times a day (QID) | ORAL | Status: DC | PRN
  Administered 2018-01-18 – 2018-01-19 (×2): 1 via ORAL
  Filled 2018-01-18 (×2): qty 1

## 2018-01-18 NOTE — Progress Notes (Signed)
Post HD Tx    01/18/18 1612  Hand-Off documentation  Report given to (Full Name) Cory Roughen RN   Report received from (Full Name) Beatris Ship, RN   Vital Signs  Temp 98.4 F (36.9 C)  Temp Source Oral  Pulse Rate 87  Pulse Rate Source Monitor  Resp (!) 21  BP 121/63  BP Location Left Arm  BP Method Automatic  Patient Position (if appropriate) Lying  Oxygen Therapy  SpO2 100 %  O2 Device Room Air  Pulse Oximetry Type Continuous  Pain Assessment  Pain Scale 0-10  Pain Score 0  Dialysis Weight  Weight 81.7 kg  Type of Weight Post-Dialysis  Post-Hemodialysis Assessment  Rinseback Volume (mL) 250 mL  KECN 64.8 V  Dialyzer Clearance Lightly streaked  Duration of HD Treatment -hour(s) 3 hour(s)  Hemodialysis Intake (mL) 500 mL  UF Total -Machine (mL) 3027 mL  Net UF (mL) 2527 mL  Tolerated HD Treatment Yes  Hemodialysis Catheter Right Internal jugular Double-lumen  No Placement Date or Time found.   Placed prior to admission: Yes  Orientation: Right  Access Location: Internal jugular  Hemodialysis Catheter Type: Double-lumen  Post treatment catheter status Capped and Clamped

## 2018-01-18 NOTE — Progress Notes (Signed)
[facility], [place] 01/18/18  Subjective:   Patient still has a cough although improved.  Diagnosed with right middle lobe infiltrate on chest x-ray.  Broad-spectrum antibiotics for pneumonia.  Also getting treatment for bronchospasm Underwent large-volume paracentesis.  5 L of ascitic fluid was removed    Objective:  Vital signs in last 24 hours:  Temp:  [97.6 F (36.4 C)-99.1 F (37.3 C)] 97.6 F (36.4 C) (11/07 0426) Pulse Rate:  [72-86] 81 (11/07 0426) Resp:  [12-23] 18 (11/07 0426) BP: (110-134)/(57-94) 134/80 (11/07 0426) SpO2:  [90 %-99 %] 97 % (11/07 0426) Weight:  [84.7 kg] 84.7 kg (11/06 1431)  Weight change:  Filed Weights   01/15/18 1438 01/17/18 1043 01/17/18 1431  Weight: 81.6 kg 88.3 kg 84.7 kg    Intake/Output:    Intake/Output Summary (Last 24 hours) at 01/18/2018 1208 Last data filed at 01/18/2018 0605 Gross per 24 hour  Intake 75.01 ml  Output 3007 ml  Net -2931.99 ml     Physical Exam: General:  Chronically ill-appearing, laying in the bed  HEENT  moist oral mucous membranes  Neck  distended neck veins  Pulm/lungs  bilateral crackles, nasal cannula oxygen  CVS/Heart  regular, no rub  Abdomen:   Mildly distended, ascites  Extremities:  trace lower extremity edema bilaterally  Neurologic:  Alert and oriented  Access:  Right IJ PermCath       Basic Metabolic Panel:  Recent Labs  Lab 01/15/18 0647 01/15/18 1056 01/15/18 1622 01/16/18 0635 01/17/18 0552 01/18/18 1135  NA 141  --  139 140 138 136  K 6.1*  --  4.7 5.5* 6.0* 4.7  CL 102  --  101 101 100 98  CO2 25  --  28 27 24 26   GLUCOSE 102*  --  203* 171* 140* 236*  BUN 57*  --  28* 42* 63* 46*  CREATININE 9.81*  --  6.02* 7.53* 8.56* 7.07*  CALCIUM 8.4*  --  8.3* 8.5* 8.6* 8.4*  MG  --   --   --   --  2.2  --   PHOS  --  6.8*  --   --  8.6*  --      CBC: Recent Labs  Lab 01/15/18 0647 01/16/18 0635 01/17/18 0552  WBC 16.4* 11.9* 14.9*   NEUTROABS 14.0*  --   --   HGB 10.0* 9.4* 10.0*  HCT 35.7* 33.9* 36.0*  MCV 87.7 88.1 87.4  PLT 346 317 366      Lab Results  Component Value Date   HEPBSAG Negative 06/16/2017   HEPBSAB Reactive 03/01/2017   HEPBIGM Negative 06/01/2016      Microbiology:  Recent Results (from the past 240 hour(s))  MRSA PCR Screening     Status: Abnormal   Collection Time: 01/15/18  8:39 AM  Result Value Ref Range Status   MRSA by PCR POSITIVE (A) NEGATIVE Final    Comment:        The GeneXpert MRSA Assay (FDA approved for NASAL specimens only), is one component of a comprehensive MRSA colonization surveillance program. It is not intended to diagnose MRSA infection nor to guide or monitor treatment for MRSA infections. RESULT CALLED TO, READ BACK BY AND VERIFIED WITH: BETH BUONO 01/15/18 1905 REC Performed at [facility], [address] [place], [place] [address]   Culture, blood (Routine X 2) w Reflex to ID Panel     Status: None (Preliminary result)   Collection Time:  01/15/18 11:13 AM  Result Value Ref Range Status   Specimen Description BLOOD COLLECT BY DIALYSIS PORT  Final   Special Requests   Final    BOTTLES DRAWN AEROBIC AND ANAEROBIC Blood Culture results may not be optimal due to an excessive volume of blood received in culture bottles   Culture   Final    NO GROWTH 3 DAYS Performed at [facility], [address] [place], [place] [address]    Report Status PENDING  Incomplete  Culture, blood (Routine X 2) w Reflex to ID Panel     Status: None (Preliminary result)   Collection Time: 01/15/18 11:14 AM  Result Value Ref Range Status   Specimen Description BLOOD COLLECTED BY DIALYSIS PORT  Final   Special Requests   Final    BOTTLES DRAWN AEROBIC AND ANAEROBIC Blood Culture results may not be optimal due to an excessive volume of blood received in culture bottles   Culture   Final    NO GROWTH 3 DAYS Performed at [facility], [address] [place], [place] [address]    Report Status PENDING  Incomplete    Coagulation Studies: No results for input(s): LABPROT, INR in the last 72 hours.  Urinalysis: No results for input(s): COLORURINE, LABSPEC, PHURINE, GLUCOSEU, HGBUR, BILIRUBINUR, KETONESUR, PROTEINUR, UROBILINOGEN, NITRITE, LEUKOCYTESUR in the last 72 hours.  Invalid input(s): APPERANCEUR    Imaging: Dg Eye Foreign Body  Result Date: 01/16/2018 CLINICAL DATA:  Metal working/exposure; clearance prior to MRI EXAM: ORBITS FOR FOREIGN BODY - 2 VIEW COMPARISON:  None. FINDINGS: There is no evidence of metallic foreign body within the orbits. No significant bone abnormality identified. The observed paranasal sinuses are clear. IMPRESSION: No evidence of metallic foreign body within the orbits. Electronically Signed   By: David  Martinique M.D.   On: 01/16/2018 13:38   Dg Abd 1 View  Result Date: 01/16/2018 CLINICAL DATA:  Abdominal distention. EXAM: ABDOMEN - 1 VIEW COMPARISON:  None. FINDINGS: Gaseous distention of the stomach with suggestion of gastric fold thickening. This may indicate gastroparesis and/or gastritis. Scattered gas and stool in the colon. No small or large bowel distention. No radiopaque stones. Vascular calcifications with iliac stents. Visualized bones appear intact. No radiopaque soft tissue foreign bodies. IMPRESSION: Gaseous distention of the stomach with suggestion of gastric fold thickening. Changes may represent gastroparesis and/or gastritis. Electronically Signed   By: Lucienne Capers M.D.   On: 01/16/2018 22:06   Mr Brain Wo Contrast  Result Date: 01/16/2018 CLINICAL DATA:  57 y/o M; right facial numbness and right-sided weakness. EXAM: MRI HEAD WITHOUT CONTRAST TECHNIQUE: Multiplanar, multiecho pulse sequences of the brain and surrounding structures were obtained without intravenous contrast. COMPARISON:  01/15/2018 CT head. FINDINGS: Brain: No acute infarction, hemorrhage, hydrocephalus,  extra-axial collection or mass lesion. Small chronic infarcts are present within the right caudate head, right lentiform nucleus, left anterior limb of internal capsule, and right paramedian pons. Moderate volume loss of the brain for age. Vascular: Normal flow voids. Skull and upper cervical spine: Normal marrow signal. Sinuses/Orbits: Negative. Other: None. IMPRESSION: 1. No acute intracranial abnormality identified. 2. Stable chronic infarcts within the basal ganglia and volume loss of the brain. Electronically Signed   By: Kristine Garbe M.D.   On: 01/16/2018 22:59   US Paracentesis  Result Date: 01/17/2018 INDICATION: History of cirrhosis with recurrent symptomatic ascites. Please perform ultrasound-guided paracentesis for therapeutic purposes. EXAM: ULTRASOUND-GUIDED PARACENTESIS COMPARISON:  Multiple previous ultrasound-guided paracenteses most recently on 12/25/2017  yielding 6.75 L MEDICATIONS: None. COMPLICATIONS: None immediate. TECHNIQUE: Informed written consent was obtained from the patient after a discussion of the risks, benefits and alternatives to treatment. A timeout was performed prior to the initiation of the procedure. Initial ultrasound scanning demonstrates a moderate amount of ascites within the right lower abdominal quadrant. The right lower abdomen was prepped and draped in the usual sterile fashion. 1% lidocaine with epinephrine was used for local anesthesia. An ultrasound image was saved for documentation purposed. An 8 Fr Safe-T-Centesis catheter was introduced. The paracentesis was performed. The catheter was removed and a dressing was applied. The patient tolerated the procedure well without immediate post procedural complication. FINDINGS: A total of approximately 5 liters of serous fluid was removed. IMPRESSION: Successful ultrasound-guided paracentesis yielding 5 liters of peritoneal fluid. Electronically Signed   By: Sandi Mariscal M.D.   On: 01/17/2018 16:55      Medications:   . sodium chloride Stopped (01/17/18 1019)  . ceFEPime (MAXIPIME) IV 200 mL/hr at 01/17/18 1022   . calcium acetate  2,001 mg Oral TID  . Chlorhexidine Gluconate Cloth  6 each Topical Q0600  . epoetin (EPOGEN/PROCRIT) injection  4,000 Units Intravenous Q M,W,F-HD  . ferrous sulfate  325 mg Oral TID  . folic acid  1 mg Oral Daily  . gabapentin  300 mg Oral BID  . guaiFENesin-dextromethorphan  10 mL Oral Q6H  . heparin  5,000 Units Subcutaneous Q8H  . labetalol  100 mg Oral Daily  . methylPREDNISolone (SOLU-MEDROL) injection  60 mg Intravenous Q6H  . metoCLOPramide (REGLAN) injection  5 mg Intravenous Q12H  . mometasone-formoterol  2 puff Inhalation BID  . multivitamin  1 tablet Oral QHS  . mupirocin ointment  1 application Nasal BID  . nicotine  21 mg Transdermal Daily  . pantoprazole  40 mg Oral Daily  . tiotropium  18 mcg Inhalation Daily  . vitamin C  500 mg Oral BID     Assessment/ Plan:  57 y.o. 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  Hyperkalemia 2.  Volume overload with acute pulmonary edema 3.  Tense ascites, 5 L removed with paracentesis. 11/7 4.  End-stage renal disease 5.  Anemia chronic kidney disease 6.  Low-grade fever, elevated WBC  Plan: Routine hemodialysis today to get him back on schedule Volume removal with hemodialysis as tolerated Antibiotics as per primary team Monitor Phos, continue binders with meals EPO with HD     LOS: 3 Merline Perkin 11/7/201912:08 PM  [place], [place]  Note: This note was prepared with Dragon dictation. Any transcription errors are unintentional

## 2018-01-18 NOTE — Progress Notes (Signed)
Post HD assesment    01/18/18 1636  Neurological  Level of Consciousness Alert  Orientation Level Oriented X4  Respiratory  Respiratory Pattern Regular;Unlabored  Chest Assessment Chest expansion symmetrical  Bilateral Breath Sounds Clear;Diminished  Cough Non-productive  Cardiac  Pulse Regular  Antiarrhythmic device No  Vascular  R Radial Pulse +2  L Radial Pulse +2  R Dorsalis Pedis Pulse +1  L Dorsalis Pedis Pulse +1  Edema Generalized  Generalized Edema +1  Integumentary  Integumentary (WDL) X  Skin Color Appropriate for ethnicity  Skin Condition Dry  Skin Integrity Surgical Incision (see LDA)  Musculoskeletal  Musculoskeletal (WDL) X  Generalized Weakness Yes  Gastrointestinal  Bowel Sounds Assessment Hypoactive  GU Assessment  Genitourinary (WDL) X  Genitourinary Symptoms Oliguria  Psychosocial  Psychosocial (WDL) WDL  Incision (Closed) 01/17/18 Abdomen Right  Date First Assessed/Time First Assessed: 01/17/18 1707   Location: Abdomen  Location Orientation: Right  Present on Admission: (c) No  Dressing Type Gauze (Comment)  Dressing Clean;Dry;Intact  Site / Wound Assessment Clean

## 2018-01-18 NOTE — Progress Notes (Signed)
HD Tx completed    01/18/18 1600  During Hemodialysis Assessment  HD Safety Checks Performed Yes  KECN 64.8 [facility]  Dialysis Fluid Bolus Normal Saline  Bolus Amount (mL) 250 mL  Intra-Hemodialysis Comments Tx completed;Tolerated well

## 2018-01-18 NOTE — Progress Notes (Signed)
PT Cancellation Note  Patient Details Name: Stanley [name] MRN: [id number] DOB: [birth year]   Cancelled Treatment:    Reason Eval/Treat Not Completed: Medical issues which prohibited therapy(Consult received and chart reviewed.  Last potassium value noted at 6.0 (from 11/6).  Per RN, received dialysis previous date, scheduled to receive again this date.  To discuss with MD plans for repeat potassium draw prior to initiation of exertional activity.   Will continue to follow and initiate as medically appropriate.)   Maddex Garlitz H. [place] Shark, PT, DPT, NCS 01/18/18, 9:44 AM [phone]

## 2018-01-18 NOTE — Plan of Care (Signed)
Dialysis to be performed today. Contract for MRSA. Pain managed with PRN pain meds.  Problem: Education: Goal: Knowledge of disease and its progression will improve Outcome: Progressing Goal: Individualized Educational Video(s) Outcome: Progressing   Problem: Fluid Volume: Goal: Compliance with measures to maintain balanced fluid volume will improve Outcome: Progressing   Problem: Health Behavior/Discharge Planning: Goal: Ability to manage health-related needs will improve Outcome: Progressing   Problem: Nutritional: Goal: Ability to make healthy dietary choices will improve Outcome: Progressing   Problem: Clinical Measurements: Goal: Complications related to the disease process, condition or treatment will be avoided or minimized Outcome: Progressing   Problem: Education: Goal: Knowledge of General Education information will improve Description Including pain rating scale, medication(s)/side effects and non-pharmacologic comfort measures Outcome: Progressing   Problem: Health Behavior/Discharge Planning: Goal: Ability to manage health-related needs will improve Outcome: Progressing   Problem: Clinical Measurements: Goal: Ability to maintain clinical measurements within normal limits will improve Outcome: Progressing Goal: Will remain free from infection Outcome: Progressing Goal: Diagnostic test results will improve Outcome: Progressing Goal: Respiratory complications will improve Outcome: Progressing Goal: Cardiovascular complication will be avoided Outcome: Progressing   Problem: Activity: Goal: Risk for activity intolerance will decrease Outcome: Progressing   Problem: Nutrition: Goal: Adequate nutrition will be maintained Outcome: Progressing   Problem: Coping: Goal: Level of anxiety will decrease Outcome: Progressing   Problem: Elimination: Goal: Will not experience complications related to bowel motility Outcome: Progressing Goal: Will not experience  complications related to urinary retention Outcome: Progressing   Problem: Pain Managment: Goal: General experience of comfort will improve Outcome: Progressing   Problem: Safety: Goal: Ability to remain free from injury will improve Outcome: Progressing   Problem: Skin Integrity: Goal: Risk for impaired skin integrity will decrease Outcome: Progressing

## 2018-01-18 NOTE — Progress Notes (Signed)
PT Cancellation Note  Patient Details Name: Alain Deschene MRN: [id number] DOB: 1960-07-03   Cancelled Treatment:    Reason Eval/Treat Not Completed: Patient at procedure or test/unavailable(Repeat potassium noted at 4.7; however, patient now off unit for dialysis.  Will continue efforts next date.)   Roshawna Colclasure H. [place] Shark, PT, DPT, NCS 01/18/18, 2:37 PM [phone]

## 2018-01-19 ENCOUNTER — Other Ambulatory Visit (INDEPENDENT_AMBULATORY_CARE_PROVIDER_SITE_OTHER): Payer: Medicare Other

## 2018-01-19 ENCOUNTER — Ambulatory Visit (INDEPENDENT_AMBULATORY_CARE_PROVIDER_SITE_OTHER): Payer: Medicare Other | Admitting: Nurse Practitioner

## 2018-01-19 ENCOUNTER — Encounter (INDEPENDENT_AMBULATORY_CARE_PROVIDER_SITE_OTHER): Payer: Medicare Other

## 2018-01-19 MED ORDER — MUPIROCIN 2 % EX OINT: 1.0000 "application " | TOPICAL_OINTMENT | Freq: Two times a day (BID) | CUTANEOUS | 0 refills | Status: DC

## 2018-01-19 MED ORDER — DOXYCYCLINE HYCLATE 100 MG PO TABS: 100.0000 mg | ORAL_TABLET | Freq: Two times a day (BID) | ORAL | 0 refills | Status: AC

## 2018-01-19 MED ORDER — PREDNISONE 10 MG (21) PO TBPK: ORAL_TABLET | ORAL | 0 refills | Status: DC

## 2018-01-19 NOTE — Progress Notes (Signed)
Patient discharge teaching given, including activity, diet, follow-up appoints, and medications. Patient verbalized understanding of all discharge instructions. IV access was d/c'd. Vitals are stable. Skin is intact except as charted in most recent assessments. Pt to be escorted out by NT, to be driven home by family.  Stanley [name]  

## 2018-01-19 NOTE — Discharge Instructions (Signed)
Continue hemodialysis as out pt.

## 2018-01-19 NOTE — Progress Notes (Signed)
[place] [facility] [place] NAME: Stanley [name]    MR#:  [id number]  DATE OF BIRTH:  11-May-1960  SUBJECTIVE:  CHIEF COMPLAINT:   Chief Complaint  Patient presents with  . Respiratory Distress    Much improved respi status after HD. Off bipap. Concern for altered mental status resolved, he also have ascitic tap 5 ltr fluid removed.  REVIEW OF SYSTEMS:  CONSTITUTIONAL: No fever, fatigue or weakness.  EYES: No blurred or double vision.  EARS, NOSE, AND THROAT: No tinnitus or ear pain.  RESPIRATORY: No cough, shortness of breath, no wheezing or hemoptysis.  CARDIOVASCULAR: No chest pain, orthopnea, edema.  GASTROINTESTINAL: No nausea, vomiting, diarrhea or abdominal pain.  GENITOURINARY: No dysuria, hematuria.  ENDOCRINE: No polyuria, nocturia,  HEMATOLOGY: No anemia, easy bruising or bleeding SKIN: No rash or lesion. MUSCULOSKELETAL: No joint pain or arthritis.   NEUROLOGIC: No tingling, numbness, weakness.  PSYCHIATRY: No anxiety or depression.   ROS  DRUG ALLERGIES:  No Known Allergies  VITALS:  Blood pressure (!) 150/96, pulse 80, temperature 98.6 F (37 C), temperature source Axillary, resp. rate 16, height 6\' 3"  (1.905 m), weight 81.7 kg, SpO2 100 %.  PHYSICAL EXAMINATION:  GENERAL:  57 y.o.-year-old patient lying in the bed with no acute distress.  EYES: Pupils equal, round, reactive to light and accommodation. No scleral icterus. Extraocular muscles intact.  HEENT: Head atraumatic, normocephalic. Oropharynx and nasopharynx clear.  NECK:  Supple, no jugular venous distention. No thyroid enlargement, no tenderness.  LUNGS: Normal breath sounds bilaterally, no wheezing, mild crepitation. No use of accessory muscles of respiration.  CARDIOVASCULAR: S1, S2 normal. No murmurs, rubs, or gallops.  ABDOMEN: Soft, nontender, nondistended. Bowel sounds present. No organomegaly or mass.  EXTREMITIES: No pedal edema, cyanosis, or clubbing.   NEUROLOGIC: Cranial nerves II through XII are intact. Muscle strength 4/5 in all extremities. Sensation intact. Gait not checked.  PSYCHIATRIC: The patient is alert and oriented x 3.  SKIN: No obvious rash, lesion, or ulcer.   Physical Exam LABORATORY PANEL:   CBC Recent Labs  Lab 01/17/18 0552  WBC 14.9*  HGB 10.0*  HCT 36.0*  PLT 366   ------------------------------------------------------------------------------------------------------------------  Chemistries  Recent Labs  Lab 01/17/18 0552 01/18/18 1135  NA 138 136  K 6.0* 4.7  CL 100 98  CO2 24 26  GLUCOSE 140* 236*  BUN 63* 46*  CREATININE 8.56* 7.07*  CALCIUM 8.6* 8.4*  MG 2.2  --    ------------------------------------------------------------------------------------------------------------------  Cardiac Enzymes Recent Labs  Lab 01/15/18 0647  TROPONINI 0.11*   ------------------------------------------------------------------------------------------------------------------  RADIOLOGY:  US Paracentesis  Result Date: 01/17/2018 INDICATION: History of cirrhosis with recurrent symptomatic ascites. Please perform ultrasound-guided paracentesis for therapeutic purposes. EXAM: ULTRASOUND-GUIDED PARACENTESIS COMPARISON:  Multiple previous ultrasound-guided paracenteses most recently on 12/25/2017 yielding 6.75 L MEDICATIONS: None. COMPLICATIONS: None immediate. TECHNIQUE: Informed written consent was obtained from the patient after a discussion of the risks, benefits and alternatives to treatment. A timeout was performed prior to the initiation of the procedure. Initial ultrasound scanning demonstrates a moderate amount of ascites within the right lower abdominal quadrant. The right lower abdomen was prepped and draped in the usual sterile fashion. 1% lidocaine with epinephrine was used for local anesthesia. An ultrasound image was saved for documentation purposed. An 8 Fr Safe-T-Centesis catheter was introduced. The  paracentesis was performed. The catheter was removed and a dressing was applied. The patient tolerated the procedure well without immediate post procedural complication. FINDINGS: A  total of approximately 5 liters of serous fluid was removed. IMPRESSION: Successful ultrasound-guided paracentesis yielding 5 liters of peritoneal fluid. Electronically Signed   By: Sandi Mariscal M.D.   On: 01/17/2018 16:55    ASSESSMENT AND PLAN:   Active Problems:   Acute respiratory failure with hypoxia (HCC)   Healthcare-associated pneumonia   Acute on chronic respiratory failure (HCC)  *Acute on chronic respiratory failure with hypoxia Secondary to pulmonary edema due to noncompliance with hemodialysis Also healthcare associated pneumonia  Improved,   Required BiPAP, admitted to stepdown unit. IV broad-spectrum antibiotics for now. ICU physician is contacted for further management and nephrologist is contacted for the need of hemodialysis.  Improved after HD, off bipap  on room air.  *Sepsis Healthcare associated pneumonia  Continue broad-spectrum antibiotics, lactic acid, procalcitonin, blood cultures are sent. MRSA PCR positive. As improving, I offered to go home today after HD, he is usually very eager to go home as we all know him very well., but surprisingly he told, he is OK to stay here today, which is a sign of his weakness. So will keep him here tonight.  *COPD exacerbation IV steroid and nebulizer treatment Supplemental oxygen and BiPAP as above.  On nasal canula oxygen now.  *Acute on chronic diastolic congestive heart failure Due to pulmonary edema and noncompliance to dialysis Hemodialysis needed, nephrologist is aware.  * Altered mental status   Appreciated consult neurologist   MRI, EEG, HBA1c, Lipid panel    Pt improved now in afternoon during my exam.    *[name] Hyperkalemia urgent hemodialysis, appreciated help by  nephrologist.  *Chronic anemia due to renal failure Continue to monitor.  *Liver cirrhosis and ascites  need a sciatic tap.  *Active smoking Counseled to quit smoking for 4 minutes and offered nicotine patch.   All the records are reviewed and case discussed with Care Management/Social Workerr. Management plans discussed with the patient, family and they are in agreement.  CODE STATUS: Full  TOTAL TIME TAKING CARE OF THIS PATIENT: 35 minutes.     POSSIBLE D/C IN 1-2 DAYS, DEPENDING ON CLINICAL CONDITION.   Vaughan Basta M.D on 01/19/2018   Between 7am to 6pm - Pager - [phone]  After 6pm go to [email or web address] - password [email or web address]  Office  [phone]  CC: Primary care physician; [name], MD  Note: This dictation was prepared with Dragon dictation along with smaller phrase technology. Any transcriptional errors that result from this process are unintentional.

## 2018-01-19 NOTE — Progress Notes (Signed)
[facility] [place] NAME: Stanley [name]    MR#:  [id number]  DATE OF BIRTH:  03-Jul-1960  SUBJECTIVE:  CHIEF COMPLAINT:   Chief Complaint  Patient presents with  . Respiratory Distress    Much improved respi status after HD. Off bipap. Concern for altered mental status today. More oriented in afternoon when I saw him.  REVIEW OF SYSTEMS:  CONSTITUTIONAL: No fever, fatigue or weakness.  EYES: No blurred or double vision.  EARS, NOSE, AND THROAT: No tinnitus or ear pain.  RESPIRATORY: No cough, shortness of breath, no wheezing or hemoptysis.  CARDIOVASCULAR: No chest pain, orthopnea, edema.  GASTROINTESTINAL: No nausea, vomiting, diarrhea or abdominal pain.  GENITOURINARY: No dysuria, hematuria.  ENDOCRINE: No polyuria, nocturia,  HEMATOLOGY: No anemia, easy bruising or bleeding SKIN: No rash or lesion. MUSCULOSKELETAL: No joint pain or arthritis.   NEUROLOGIC: No tingling, numbness, weakness.  PSYCHIATRY: No anxiety or depression.   ROS  DRUG ALLERGIES:  No Known Allergies  VITALS:  Blood pressure (!) 150/96, pulse 80, temperature 98.6 F (37 C), temperature source Axillary, resp. rate 16, height 6\' 3"  (1.905 m), weight 81.7 kg, SpO2 100 %.  PHYSICAL EXAMINATION:  GENERAL:  57 y.o.-year-old patient lying in the bed with no acute distress.  EYES: Pupils equal, round, reactive to light and accommodation. No scleral icterus. Extraocular muscles intact.  HEENT: Head atraumatic, normocephalic. Oropharynx and nasopharynx clear.  NECK:  Supple, no jugular venous distention. No thyroid enlargement, no tenderness.  LUNGS: Normal breath sounds bilaterally, no wheezing, mild crepitation. No use of accessory muscles of respiration.  CARDIOVASCULAR: S1, S2 normal. No murmurs, rubs, or gallops.  ABDOMEN: Soft, nontender, nondistended. Bowel sounds present. No organomegaly or mass.  EXTREMITIES: No pedal edema, cyanosis, or clubbing.   NEUROLOGIC: Cranial nerves II through XII are intact. Muscle strength 4/5 in all extremities. Sensation intact. Gait not checked.  PSYCHIATRIC: The patient is alert and oriented x 3.  SKIN: No obvious rash, lesion, or ulcer.   Physical Exam LABORATORY PANEL:   CBC Recent Labs  Lab 01/17/18 0552  WBC 14.9*  HGB 10.0*  HCT 36.0*  PLT 366   ------------------------------------------------------------------------------------------------------------------  Chemistries  Recent Labs  Lab 01/17/18 0552 01/18/18 1135  NA 138 136  K 6.0* 4.7  CL 100 98  CO2 24 26  GLUCOSE 140* 236*  BUN 63* 46*  CREATININE 8.56* 7.07*  CALCIUM 8.6* 8.4*  MG 2.2  --    ------------------------------------------------------------------------------------------------------------------  Cardiac Enzymes Recent Labs  Lab 01/15/18 0647  TROPONINI 0.11*   ------------------------------------------------------------------------------------------------------------------  RADIOLOGY:  US Paracentesis  Result Date: 01/17/2018 INDICATION: History of cirrhosis with recurrent symptomatic ascites. Please perform ultrasound-guided paracentesis for therapeutic purposes. EXAM: ULTRASOUND-GUIDED PARACENTESIS COMPARISON:  Multiple previous ultrasound-guided paracenteses most recently on 12/25/2017 yielding 6.75 L MEDICATIONS: None. COMPLICATIONS: None immediate. TECHNIQUE: Informed written consent was obtained from the patient after a discussion of the risks, benefits and alternatives to treatment. A timeout was performed prior to the initiation of the procedure. Initial ultrasound scanning demonstrates a moderate amount of ascites within the right lower abdominal quadrant. The right lower abdomen was prepped and draped in the usual sterile fashion. 1% lidocaine with epinephrine was used for local anesthesia. An ultrasound image was saved for documentation purposed. An 8 Fr Safe-T-Centesis catheter was introduced. The  paracentesis was performed. The catheter was removed and a dressing was applied. The patient tolerated the procedure well without immediate post procedural complication. FINDINGS: A total  of approximately 5 liters of serous fluid was removed. IMPRESSION: Successful ultrasound-guided paracentesis yielding 5 liters of peritoneal fluid. Electronically Signed   By: Sandi Mariscal M.D.   On: 01/17/2018 16:55    ASSESSMENT AND PLAN:   Active Problems:   Acute respiratory failure with hypoxia (HCC)   Healthcare-associated pneumonia   Acute on chronic respiratory failure (HCC)  *Acute on chronic respiratory failure with hypoxia Secondary to pulmonary edema due to noncompliance with hemodialysis Also healthcare associated pneumonia  Improved,   Required BiPAP, admitted to stepdown unit. IV broad-spectrum antibiotics for now. ICU physician is contacted for further management and nephrologist is contacted for the need of hemodialysis.  Improved after HD, off bipap  *Sepsis Healthcare associated pneumonia  Continue broad-spectrum antibiotics, lactic acid, procalcitonin, blood cultures are sent. MRSA PCR positive.  *COPD exacerbation IV steroid and nebulizer treatment Supplemental oxygen and BiPAP as above.  On nasal canula oxygen now.  *Acute on chronic diastolic congestive heart failure Due to pulmonary edema and noncompliance to dialysis Hemodialysis needed, nephrologist is aware.  * Altered mental status   Appreciated consult neurologist   MRI, EEG, HBA1c, Lipid panel    Pt improved now in afternoon during my exam.    *[name] Hyperkalemia urgent hemodialysis, appreciated help by nephrologist.  *Chronic anemia due to renal failure Continue to monitor.  *Liver cirrhosis and ascites  need a sciatic tap.  *Active smoking Counseled to quit smoking for 4 minutes and offered nicotine patch.   All the records are reviewed and case discussed  with Care Management/Social Workerr. Management plans discussed with the patient, family and they are in agreement.  CODE STATUS: Full  TOTAL TIME TAKING CARE OF THIS PATIENT: 35 minutes.     POSSIBLE D/C IN 1-2 DAYS, DEPENDING ON CLINICAL CONDITION.   Vaughan Basta M.D on 01/19/2018   Between 7am to 6pm - Pager - [phone]  After 6pm go to [email or web address] - password [email or web address] [facility]  Office  [phone]  CC: Primary care physician; [name], MD  Note: This dictation was prepared with Dragon dictation along with smaller phrase technology. Any transcriptional errors that result from this process are unintentional.

## 2018-01-19 NOTE — Discharge Summary (Signed)
[facility] at [place] NAME: Stanley [name]    MR#:  [id number]  DATE OF BIRTH:  [birth year]  DATE OF ADMISSION:  01/15/2018 ADMITTING PHYSICIAN: Vaughan Basta, MD  DATE OF DISCHARGE: 01/19/2018   PRIMARY CARE PHYSICIAN: [name], MD    ADMISSION DIAGNOSIS:  Acute pulmonary edema ([place]) [J81.0]  DISCHARGE DIAGNOSIS:  Active Problems:   Acute respiratory failure with hypoxia (HCC)   Healthcare-associated pneumonia   Acute on chronic respiratory failure ([place])   SECONDARY DIAGNOSIS:   Past Medical History:  Diagnosis Date  . Alcohol abuse   . CHF (congestive heart failure) ([place])   . Cirrhosis ([place])   . Coronary artery disease 2009  . Drug abuse ([place])   . End stage renal disease on dialysis [facility]) NEPHROLOGIST-   DR [facility]  IN [place]   HEMODIALYSIS --   TUES/  THURS/  SAT  . Gastrointestinal bleed 06/13/2017   From chart...hx of multiple GI bleeds  . GERD (gastroesophageal reflux disease)   . Hyperlipidemia   . Hypertension   . PAD (peripheral artery disease) ([place])   . Renal insufficiency    Per pt, 32 oz fluid restriction per day  . S/P triple vessel bypass 06/09/2016   2009ish  . Suicidal ideation    & HOMICIDAL IDEATION --  06-16-2013   ADMITTED TO [place] COURSE:    *Acute on chronic respiratory failure with hypoxia Secondary to pulmonary edema due to noncompliance with hemodialysis Also healthcare associated pneumonia  Improved,   Required BiPAP, admitted to stepdown unit. IV broad-spectrum antibiotics for now. ICU physician is contacted for further management and nephrologist is contacted for the need of hemodialysis.  Improved after HD, off bipap  on room air.  ambulating now fine.  *Sepsis Healthcare associated pneumonia  Continue broad-spectrum antibiotics, lactic acid, procalcitonin, blood cultures are sent. MRSA PCR positive. As improving, today  morning- he told nurse- he want to go home, and going to walk out, I went quickly to room and explained about the infection and need for Abx and he stayed until we finish discharge formalities and given prescription for doxycycline.  *COPD exacerbation IV steroid and nebulizer treatment Supplemental oxygen and BiPAP as above.  On room air now.  *Acute on chronic diastolic congestive heart failure Due to pulmonary edema and noncompliance to dialysis Hemodialysis needed, nephrologist is aware.  * Altered mental status   Appreciated consult neurologist   MRI, EEG, HBA1c, Lipid panel    Pt improved now in afternoon during my exam.    *[name] Hyperkalemia urgent hemodialysis, appreciated help by nephrologist.  *Chronic anemia due to renal failure Continue to monitor.  *Liver cirrhosis and ascites  need a sciatic tap.  *Active smoking Counseled to quit smoking for 4 minutes and offered nicotine patch.  DISCHARGE CONDITIONS:   Stable.  CONSULTS OBTAINED:  Treatment Team:  Alexis Goodell, MD Murlean Iba, MD  DRUG ALLERGIES:  No Known Allergies  DISCHARGE MEDICATIONS:   Allergies as of 01/19/2018   No Known Allergies     Medication List    STOP taking these medications   labetalol 100 MG tablet Commonly known as:  NORMODYNE   nitroGLYCERIN 0.4 MG SL tablet Commonly known as:  NITROSTAT     TAKE these medications   budesonide-formoterol 160-4.5 MCG/ACT inhaler Commonly known as:  SYMBICORT Inhale 2 puffs into the lungs daily.   calcium acetate 667 MG  capsule Commonly known as:  PHOSLO Take 2,001 mg by mouth 3 (three) times daily.   doxycycline 100 MG tablet Commonly known as:  VIBRA-TABS Take 1 tablet (100 mg total) by mouth 2 (two) times daily for 3 days.   ferrous sulfate 325 (65 FE) MG tablet Take 1 tablet by mouth 3 (three) times daily.   folic acid 1 MG tablet Commonly known as:  FOLVITE Take 1 tablet (1  mg total) by mouth daily.   gabapentin 300 MG capsule Commonly known as:  NEURONTIN Take 300 mg by mouth 3 (three) times daily.   ipratropium-albuterol 0.5-2.5 (3) MG/3ML Soln Commonly known as:  DUONEB Take 3 mLs by nebulization every 6 (six) hours as needed.   multivitamin Tabs tablet Take 1 tablet by mouth at bedtime.   mupirocin ointment 2 % Commonly known as:  BACTROBAN Place 1 application into the nose 2 (two) times daily.   omeprazole 40 MG capsule Commonly known as:  PRILOSEC Take 40 mg by mouth daily.   predniSONE 10 MG (21) Tbpk tablet Commonly known as:  STERAPRED UNI-PAK 21 TAB Take 6 tabs first day, 5 tab on day 2, then 4 on day 3rd, 3 tabs on day 4th , 2 tab on day 5th, and 1 tab on 6th day.   Spacer/Aero Chamber Mouthpiece Misc 1 Units by Does not apply route every 4 (four) hours as needed (wheezing).   tiotropium 18 MCG inhalation capsule Commonly known as:  SPIRIVA Place 1 capsule (18 mcg total) into inhaler and inhale daily.   SPIRIVA HANDIHALER 18 MCG inhalation capsule Generic drug:  tiotropium Place 1 capsule into inhaler and inhale daily.        DISCHARGE INSTRUCTIONS:   Follow with out pt HD.  If you experience worsening of your admission symptoms, develop shortness of breath, life threatening emergency, suicidal or homicidal thoughts you must seek medical attention immediately by calling 911 or calling your MD immediately  if symptoms less severe.  You Must read complete instructions/literature along with all the possible adverse reactions/side effects for all the Medicines you take and that have been prescribed to you. Take any new Medicines after you have completely understood and accept all the possible adverse reactions/side effects.   Please note  You were cared for by a hospitalist during your hospital stay. If you have any questions about your discharge medications or the care you received while you were in the hospital after you are  discharged, you can call the unit and asked to speak with the hospitalist on call if the hospitalist that took care of you is not available. Once you are discharged, your primary care physician will handle any further medical issues. Please note that NO REFILLS for any discharge medications will be authorized once you are discharged, as it is imperative that you return to your primary care physician (or establish a relationship with a primary care physician if you do not have one) for your aftercare needs so that they can reassess your need for medications and monitor your lab values.    Today   CHIEF COMPLAINT:   Chief Complaint  Patient presents with  . Respiratory Distress    HISTORY OF PRESENT ILLNESS:  Stanley [name]  is a 57 y.o. male with a known history of alcohol abuse, congestive heart failure, cirrhosis, coronary artery disease, drug abuse, end-stage renal disease, noncompliant-had last hemodialysis done Saturday, started to feel short of breath so came to emergency room.  Noted to have  hypoxia and some infiltrates on the lung and started on BiPAP. Given to hospitalist team for further management.   VITAL SIGNS:  Blood pressure (!) 150/96, pulse 80, temperature 98.6 F (37 C), temperature source Axillary, resp. rate 16, height 6\' 3"  (1.905 m), weight 81.7 kg, SpO2 100 %.  I/O:    Intake/Output Summary (Last 24 hours) at 01/19/2018 0916 Last data filed at 01/19/2018 0617 Gross per 24 hour  Intake 580.1 ml  Output 2527 ml  Net -1946.9 ml    PHYSICAL EXAMINATION:  GENERAL:  57 y.o.-year-old patient lying in the bed with no acute distress.  EYES: Pupils equal, round, reactive to light and accommodation. No scleral icterus. Extraocular muscles intact.  HEENT: Head atraumatic, normocephalic. Oropharynx and nasopharynx clear.  NECK:  Supple, no jugular venous distention. No thyroid enlargement, no tenderness.  LUNGS: Normal breath sounds bilaterally, no wheezing, rales,rhonchi  or crepitation. No use of accessory muscles of respiration.  CARDIOVASCULAR: S1, S2 normal. No murmurs, rubs, or gallops.  ABDOMEN: Soft, non-tender, some-distended. Bowel sounds present. No organomegaly or mass.  EXTREMITIES: No pedal edema, cyanosis, or clubbing.  NEUROLOGIC: Cranial nerves II through XII are intact. Muscle strength 5/5 in all extremities. Sensation intact. Gait not checked.  PSYCHIATRIC: The patient is alert and oriented x 3.  SKIN: No obvious rash, lesion, or ulcer.   DATA REVIEW:   CBC Recent Labs  Lab 01/17/18 0552  WBC 14.9*  HGB 10.0*  HCT 36.0*  PLT 366    Chemistries  Recent Labs  Lab 01/17/18 0552 01/18/18 1135  NA 138 136  K 6.0* 4.7  CL 100 98  CO2 24 26  GLUCOSE 140* 236*  BUN 63* 46*  CREATININE 8.56* 7.07*  CALCIUM 8.6* 8.4*  MG 2.2  --     Cardiac Enzymes Recent Labs  Lab 01/15/18 0647  TROPONINI 0.11*    Microbiology Results  Results for orders placed or performed during the hospital encounter of 01/15/18  MRSA PCR Screening     Status: Abnormal   Collection Time: 01/15/18  8:39 AM  Result Value Ref Range Status   MRSA by PCR POSITIVE (A) NEGATIVE Final    Comment:        The GeneXpert MRSA Assay (FDA approved for NASAL specimens only), is one component of a comprehensive MRSA colonization surveillance program. It is not intended to diagnose MRSA infection nor to guide or monitor treatment for MRSA infections. RESULT CALLED TO, READ BACK BY AND VERIFIED WITH: BETH BUONO 01/15/18 1905 REC Performed at [facility], [address] [place], [place] [address]   Culture, blood (Routine X 2) w Reflex to ID Panel     Status: None (Preliminary result)   Collection Time: 01/15/18 11:13 AM  Result Value Ref Range Status   Specimen Description BLOOD COLLECT BY DIALYSIS PORT  Final   Special Requests   Final    BOTTLES DRAWN AEROBIC AND ANAEROBIC Blood Culture results may not be optimal due to an excessive volume of  blood received in culture bottles   Culture   Final    NO GROWTH 4 DAYS Performed at [facility], [address] [place], [place] [address]    Report Status PENDING  Incomplete  Culture, blood (Routine X 2) w Reflex to ID Panel     Status: None (Preliminary result)   Collection Time: 01/15/18 11:14 AM  Result Value Ref Range Status   Specimen Description BLOOD COLLECTED BY DIALYSIS PORT  Final  Special Requests   Final    BOTTLES DRAWN AEROBIC AND ANAEROBIC Blood Culture results may not be optimal due to an excessive volume of blood received in culture bottles   Culture   Final    NO GROWTH 4 DAYS Performed at [facility], [address] [place], [place] [address]    Report Status PENDING  Incomplete    RADIOLOGY:  US Paracentesis  Result Date: 01/17/2018 INDICATION: History of cirrhosis with recurrent symptomatic ascites. Please perform ultrasound-guided paracentesis for therapeutic purposes. EXAM: ULTRASOUND-GUIDED PARACENTESIS COMPARISON:  Multiple previous ultrasound-guided paracenteses most recently on 12/25/2017 yielding 6.75 L MEDICATIONS: None. COMPLICATIONS: None immediate. TECHNIQUE: Informed written consent was obtained from the patient after a discussion of the risks, benefits and alternatives to treatment. A timeout was performed prior to the initiation of the procedure. Initial ultrasound scanning demonstrates a moderate amount of ascites within the right lower abdominal quadrant. The right lower abdomen was prepped and draped in the usual sterile fashion. 1% lidocaine with epinephrine was used for local anesthesia. An ultrasound image was saved for documentation purposed. An 8 Fr Safe-T-Centesis catheter was introduced. The paracentesis was performed. The catheter was removed and a dressing was applied. The patient tolerated the procedure well without immediate post procedural complication. FINDINGS: A total of approximately 5 liters of serous fluid was  removed. IMPRESSION: Successful ultrasound-guided paracentesis yielding 5 liters of peritoneal fluid. Electronically Signed   By: Sandi Mariscal M.D.   On: 01/17/2018 16:55    EKG:   Orders placed or performed during the hospital encounter of 01/15/18  . ED EKG  . ED EKG  . EKG 12-Lead  . EKG 12-Lead  . EKG      Management plans discussed with the patient, family and they are in agreement.  CODE STATUS: full.    Code Status Orders  (From admission, onward)         Start     Ordered   01/15/18 1438  Full code  Continuous     01/15/18 1437        Code Status History    Date Active Date Inactive Code Status Order ID Comments User Context   12/23/2017 2225 12/26/2017 1846 Full Code [id number]  Amelia Jo, MD Inpatient   12/09/2017 1043 12/11/2017 1617 Full Code [id number]  Epifanio Lesches, MD ED   11/27/2017 1122 11/28/2017 1827 Full Code [id number]  Demetrios Loll, MD Inpatient   11/18/2017 2008 11/20/2017 1853 Full Code [id number]  Saundra Shelling, MD Inpatient   10/30/2017 0007 10/31/2017 1724 Full Code [id number]  Amelia Jo, MD Inpatient   06/12/2017 1707 06/16/2017 1704 Full Code [id number]  Nicholes Mango, MD Inpatient   06/02/2017 1603 06/06/2017 2222 Full Code [id number]  Demetrios Loll, MD Inpatient   05/24/2017 1831 05/25/2017 1921 Full Code [id number]  Saundra Shelling, MD ED   05/03/2017 1331 05/05/2017 1806 Full Code [id number]  Bettey Costa, MD Inpatient   04/01/2017 0947 04/03/2017 1156 Full Code [id number]  Loletha Grayer, MD ED   03/09/2017 0010 03/09/2017 1928 Full Code [id number]  Lance Coon, MD Inpatient   03/03/2017 2027 03/04/2017 1920 Full Code [id number]  Demetrios Loll, MD Inpatient   02/28/2017 2254 03/02/2017 1519 Full Code [id number]  Lance Coon, MD Inpatient   02/22/2017 1721 02/24/2017 1359 Full Code [id number]  Dustin Flock, MD Inpatient   01/22/2017 2337 01/25/2017 1522 Full Code [id number]  Gorden Harms, MD Inpatient   01/07/2017 1445 01/13/2017 1812 Full Code [id number]   Idelle Crouch, MD  Inpatient   12/22/2016 1431 12/24/2016 1804 Full Code [id number]  Hillary Bow, MD ED   12/20/2016 2020 12/21/2016 1853 Full Code [id number]  Demetrios Loll, MD Inpatient   11/26/2016 1030 11/28/2016 1522 Full Code [id number]  Henreitta Leber, MD Inpatient   11/15/2016 1930 11/16/2016 1822 Full Code [id number]  Fritzi Mandes, MD Inpatient   10/29/2016 1453 10/30/2016 1506 Full Code [id number]  Loletha Grayer, MD ED   09/15/2016 1757 09/16/2016 1817 Full Code [id number]  Dustin Flock, MD ED   08/16/2016 0945 08/18/2016 1816 Full Code [id number]  Hillary Bow, MD ED   08/09/2016 1659 08/12/2016 2034 Full Code [id number]  Fritzi Mandes, MD Inpatient   07/03/2016 2154 07/05/2016 2122 Full Code [id number]  Demetrios Loll, MD Inpatient   06/24/2016 0423 06/26/2016 1546 Full Code [id number]  Saundra Shelling, MD Inpatient   06/14/2016 1453 06/19/2016 2104 Full Code [id number]  Hillary Bow, MD ED   05/31/2016 0642 06/01/2016 2130 Full Code [id number]  Saundra Shelling, MD Inpatient   05/05/2016 2309 05/10/2016 2157 Full Code [id number]  Vianne Bulls, MD Inpatient   04/16/2016 0356 04/17/2016 1827 Full Code [id number]  Saundra Shelling, MD Inpatient   01/19/2016 1513 01/22/2016 1414 Full Code [id number]  Loletha Grayer, MD ED   09/24/2015 1033 09/25/2015 1331 Full Code [id number]  Loletha Grayer, MD ED   05/16/2015 1417 05/17/2015 2057 Full Code [id number]  Idelle Crouch, MD Inpatient   04/30/2015 2008 05/08/2015 1940 Full Code [id number]  Vaughan Basta, MD Inpatient   04/06/2015 0507 04/07/2015 1736 Full Code [id number]  Lance Coon, MD Inpatient   03/23/2015 1107 03/27/2015 1246 Full Code [id number]  Bettey Costa, MD Inpatient   06/19/2013 1132 06/21/2013 1749 Full Code [id number]  Cristal Ford, DO Inpatient   06/16/2013 1726 06/19/2013 1132 Full Code [id number]  Margarita Mail, PA-C ED   06/05/2013 1533 06/06/2013 1856 Full Code [id number]  Theodoro Kos, DO Inpatient   06/02/2013 0901 06/05/2013 1533 Full Code [id number]  Velvet Bathe,  MD Inpatient   06/02/2013 0840 06/02/2013 0901 Full Code [id number]  Velvet Bathe, MD Inpatient      TOTAL TIME TAKING CARE OF THIS PATIENT: 35 minutes.    Vaughan Basta M.D on 01/19/2018 at 9:16 AM  Between 7am to 6pm - Pager - [phone]  After 6pm go to [email or web address] - password [email or web address] [email or web address]  Office  [phone]  CC: Primary care physician; [name], MD   Note: This dictation was prepared with Dragon dictation along with smaller phrase technology. Any transcriptional errors that result from this process are unintentional.

## 2018-01-20 LAB — CULTURE, BLOOD (ROUTINE X 2)
CULTURE: NO GROWTH
Culture: NO GROWTH

## 2018-01-23 DIAGNOSIS — D509 Iron deficiency anemia, unspecified: Secondary | ICD-10-CM | POA: Diagnosis not present

## 2018-01-23 DIAGNOSIS — Z992 Dependence on renal dialysis: Secondary | ICD-10-CM | POA: Diagnosis not present

## 2018-01-23 DIAGNOSIS — D631 Anemia in chronic kidney disease: Secondary | ICD-10-CM | POA: Diagnosis not present

## 2018-01-23 DIAGNOSIS — N2581 Secondary hyperparathyroidism of renal origin: Secondary | ICD-10-CM | POA: Diagnosis not present

## 2018-01-23 DIAGNOSIS — N186 End stage renal disease: Secondary | ICD-10-CM | POA: Diagnosis not present

## 2018-01-25 DIAGNOSIS — D631 Anemia in chronic kidney disease: Secondary | ICD-10-CM | POA: Diagnosis not present

## 2018-01-25 DIAGNOSIS — Z992 Dependence on renal dialysis: Secondary | ICD-10-CM | POA: Diagnosis not present

## 2018-01-25 DIAGNOSIS — N186 End stage renal disease: Secondary | ICD-10-CM | POA: Diagnosis not present

## 2018-01-25 DIAGNOSIS — N2581 Secondary hyperparathyroidism of renal origin: Secondary | ICD-10-CM | POA: Diagnosis not present

## 2018-01-25 DIAGNOSIS — D509 Iron deficiency anemia, unspecified: Secondary | ICD-10-CM | POA: Diagnosis not present

## 2018-01-27 DIAGNOSIS — D509 Iron deficiency anemia, unspecified: Secondary | ICD-10-CM | POA: Diagnosis not present

## 2018-01-27 DIAGNOSIS — N186 End stage renal disease: Secondary | ICD-10-CM | POA: Diagnosis not present

## 2018-01-27 DIAGNOSIS — D631 Anemia in chronic kidney disease: Secondary | ICD-10-CM | POA: Diagnosis not present

## 2018-01-27 DIAGNOSIS — N2581 Secondary hyperparathyroidism of renal origin: Secondary | ICD-10-CM | POA: Diagnosis not present

## 2018-01-27 DIAGNOSIS — Z992 Dependence on renal dialysis: Secondary | ICD-10-CM | POA: Diagnosis not present

## 2018-01-29 ENCOUNTER — Inpatient Hospital Stay
Admission: EM | Admit: 2018-01-29 | Discharge: 2018-01-30 | DRG: 640 | Disposition: A | Discharge status: Home / Self Care | Payer: Medicare Other | Attending: Internal Medicine | Admitting: Internal Medicine

## 2018-01-29 ENCOUNTER — Other Ambulatory Visit: Payer: Self-pay

## 2018-01-29 ENCOUNTER — Emergency Department: Payer: Medicare Other

## 2018-01-29 ENCOUNTER — Inpatient Hospital Stay: Payer: Medicare Other

## 2018-01-29 ENCOUNTER — Encounter: Payer: Self-pay | Admitting: Emergency Medicine

## 2018-01-29 DIAGNOSIS — R509 Fever, unspecified: Secondary | ICD-10-CM | POA: Diagnosis not present

## 2018-01-29 DIAGNOSIS — R188 Other ascites: Secondary | ICD-10-CM | POA: Diagnosis present

## 2018-01-29 DIAGNOSIS — K7460 Unspecified cirrhosis of liver: Secondary | ICD-10-CM | POA: Diagnosis present

## 2018-01-29 DIAGNOSIS — R918 Other nonspecific abnormal finding of lung field: Secondary | ICD-10-CM | POA: Diagnosis not present

## 2018-01-29 DIAGNOSIS — N186 End stage renal disease: Secondary | ICD-10-CM | POA: Diagnosis present

## 2018-01-29 DIAGNOSIS — Q8781 Alport syndrome: Secondary | ICD-10-CM

## 2018-01-29 DIAGNOSIS — Z7951 Long term (current) use of inhaled steroids: Secondary | ICD-10-CM

## 2018-01-29 DIAGNOSIS — F17210 Nicotine dependence, cigarettes, uncomplicated: Secondary | ICD-10-CM | POA: Diagnosis present

## 2018-01-29 DIAGNOSIS — Z841 Family history of disorders of kidney and ureter: Secondary | ICD-10-CM | POA: Diagnosis not present

## 2018-01-29 DIAGNOSIS — J449 Chronic obstructive pulmonary disease, unspecified: Secondary | ICD-10-CM | POA: Diagnosis not present

## 2018-01-29 DIAGNOSIS — Z79899 Other long term (current) drug therapy: Secondary | ICD-10-CM | POA: Diagnosis not present

## 2018-01-29 DIAGNOSIS — Z95828 Presence of other vascular implants and grafts: Secondary | ICD-10-CM | POA: Diagnosis not present

## 2018-01-29 DIAGNOSIS — R0902 Hypoxemia: Secondary | ICD-10-CM | POA: Diagnosis not present

## 2018-01-29 DIAGNOSIS — I272 Pulmonary hypertension, unspecified: Secondary | ICD-10-CM | POA: Diagnosis present

## 2018-01-29 DIAGNOSIS — Z992 Dependence on renal dialysis: Secondary | ICD-10-CM | POA: Diagnosis not present

## 2018-01-29 DIAGNOSIS — I120 Hypertensive chronic kidney disease with stage 5 chronic kidney disease or end stage renal disease: Secondary | ICD-10-CM | POA: Diagnosis present

## 2018-01-29 DIAGNOSIS — D631 Anemia in chronic kidney disease: Secondary | ICD-10-CM | POA: Diagnosis present

## 2018-01-29 DIAGNOSIS — E875 Hyperkalemia: Principal | ICD-10-CM | POA: Diagnosis present

## 2018-01-29 DIAGNOSIS — Z22322 Carrier or suspected carrier of Methicillin resistant Staphylococcus aureus: Secondary | ICD-10-CM | POA: Diagnosis not present

## 2018-01-29 DIAGNOSIS — J9601 Acute respiratory failure with hypoxia: Secondary | ICD-10-CM | POA: Diagnosis present

## 2018-01-29 DIAGNOSIS — K219 Gastro-esophageal reflux disease without esophagitis: Secondary | ICD-10-CM | POA: Diagnosis present

## 2018-01-29 DIAGNOSIS — I739 Peripheral vascular disease, unspecified: Secondary | ICD-10-CM | POA: Diagnosis present

## 2018-01-29 DIAGNOSIS — Z951 Presence of aortocoronary bypass graft: Secondary | ICD-10-CM

## 2018-01-29 DIAGNOSIS — I2510 Atherosclerotic heart disease of native coronary artery without angina pectoris: Secondary | ICD-10-CM | POA: Diagnosis present

## 2018-01-29 DIAGNOSIS — E785 Hyperlipidemia, unspecified: Secondary | ICD-10-CM | POA: Diagnosis present

## 2018-01-29 DIAGNOSIS — R0602 Shortness of breath: Secondary | ICD-10-CM | POA: Diagnosis not present

## 2018-01-29 DIAGNOSIS — K7031 Alcoholic cirrhosis of liver with ascites: Secondary | ICD-10-CM

## 2018-01-29 LAB — CBC WITH DIFFERENTIAL/PLATELET
Abs Immature Granulocytes: 0.34 10*3/uL — ABNORMAL HIGH (ref 0.00–0.07)
Basophils Absolute: 0 10*3/uL (ref 0.0–0.1)
Basophils Relative: 0 %
EOS ABS: 0.1 10*3/uL (ref 0.0–0.5)
Eosinophils Relative: 0 %
HCT: 36.4 % — ABNORMAL LOW (ref 39.0–52.0)
HEMOGLOBIN: 10.2 g/dL — AB (ref 13.0–17.0)
Immature Granulocytes: 3 %
LYMPHS ABS: 0.8 10*3/uL (ref 0.7–4.0)
LYMPHS PCT: 6 %
MCH: 24.8 pg — AB (ref 26.0–34.0)
MCHC: 28 g/dL — AB (ref 30.0–36.0)
MCV: 88.6 fL (ref 80.0–100.0)
MONO ABS: 1.2 10*3/uL — AB (ref 0.1–1.0)
MONOS PCT: 8 %
Neutro Abs: 11.4 10*3/uL — ABNORMAL HIGH (ref 1.7–7.7)
Neutrophils Relative %: 83 %
Platelets: 225 10*3/uL (ref 150–400)
RBC: 4.11 MIL/uL — ABNORMAL LOW (ref 4.22–5.81)
RDW: 21.3 % — ABNORMAL HIGH (ref 11.5–15.5)
WBC: 13.7 10*3/uL — ABNORMAL HIGH (ref 4.0–10.5)
nRBC: 0 % (ref 0.0–0.2)

## 2018-01-29 LAB — BODY FLUID CELL COUNT WITH DIFFERENTIAL
EOS FL: 0 %
Lymphs, Fluid: 30 %
MONOCYTE-MACROPHAGE-SEROUS FLUID: 68 %
Neutrophil Count, Fluid: 2 %
Other Cells, Fluid: 0 %
Total Nucleated Cell Count, Fluid: 275 cu mm

## 2018-01-29 LAB — COMPREHENSIVE METABOLIC PANEL
ALK PHOS: 116 U/L (ref 38–126)
ALT: 27 U/L (ref 0–44)
AST: 18 U/L (ref 15–41)
Albumin: 3.1 g/dL — ABNORMAL LOW (ref 3.5–5.0)
Anion gap: 11 (ref 5–15)
BUN: 48 mg/dL — AB (ref 6–20)
CALCIUM: 8.6 mg/dL — AB (ref 8.9–10.3)
CHLORIDE: 103 mmol/L (ref 98–111)
CO2: 25 mmol/L (ref 22–32)
CREATININE: 8.72 mg/dL — AB (ref 0.61–1.24)
GFR calc Af Amer: 7 mL/min — ABNORMAL LOW (ref >60)
GFR calc non Af Amer: 6 mL/min — ABNORMAL LOW (ref >60)
Glucose, Bld: 101 mg/dL — ABNORMAL HIGH (ref 70–99)
Potassium: 7 mmol/L (ref 3.5–5.1)
SODIUM: 139 mmol/L (ref 135–145)
Total Bilirubin: 0.9 mg/dL (ref 0.3–1.2)
Total Protein: 6.8 g/dL (ref 6.5–8.1)

## 2018-01-29 LAB — CG4 I-STAT (LACTIC ACID): Lactic Acid, Venous: 1.09 mmol/L (ref 0.5–1.9)

## 2018-01-29 LAB — PROTIME-INR
INR: 1.08
Prothrombin Time: 13.9 seconds (ref 11.4–15.2)

## 2018-01-29 LAB — INFLUENZA PANEL BY PCR (TYPE A & B)
INFLAPCR: NEGATIVE
INFLBPCR: NEGATIVE

## 2018-01-29 MED ORDER — ALBUTEROL SULFATE (2.5 MG/3ML) 0.083% IN NEBU
2.5000 mg | INHALATION_SOLUTION | Freq: Once | RESPIRATORY_TRACT | Status: AC
  Administered 2018-01-29: 2.5 mg via RESPIRATORY_TRACT
  Filled 2018-01-29: qty 3

## 2018-01-29 MED ORDER — ACETAMINOPHEN 500 MG PO TABS
500.0000 mg | ORAL_TABLET | Freq: Once | ORAL | Status: AC
  Administered 2018-01-29: 500 mg via ORAL
  Filled 2018-01-29: qty 1

## 2018-01-29 MED ORDER — SODIUM CHLORIDE 0.9 % IV SOLN: 1.0000 g | INTRAVENOUS | Status: DC

## 2018-01-29 MED ORDER — LIDOCAINE VISCOUS HCL 2 % MT SOLN
15.0000 mL | Freq: Once | OROMUCOSAL | Status: AC
  Administered 2018-01-29: 15 mL via OROMUCOSAL
  Filled 2018-01-29: qty 15

## 2018-01-29 MED ORDER — VANCOMYCIN HCL IN DEXTROSE 1-5 GM/200ML-% IV SOLN
1000.0000 mg | Freq: Once | INTRAVENOUS | Status: AC
  Administered 2018-01-29: 1000 mg via INTRAVENOUS
  Filled 2018-01-29: qty 200

## 2018-01-29 MED ORDER — DEXTROSE 50 % IV SOLN
1.0000 | Freq: Once | INTRAVENOUS | Status: AC
  Administered 2018-01-29: 50 mL via INTRAVENOUS
  Filled 2018-01-29: qty 50

## 2018-01-29 MED ORDER — LIDOCAINE-EPINEPHRINE 2 %-1:100000 IJ SOLN
30.0000 mL | Freq: Once | INTRAMUSCULAR | Status: AC
  Administered 2018-01-29: 30 mL via INTRADERMAL
  Filled 2018-01-29: qty 2

## 2018-01-29 MED ORDER — NYSTATIN 100000 UNIT/ML MT SUSP
5.0000 mL | Freq: Four times a day (QID) | OROMUCOSAL | Status: DC
  Administered 2018-01-29 – 2018-01-30 (×3): 500000 [IU] via ORAL
  Filled 2018-01-29 (×5): qty 5

## 2018-01-29 MED ORDER — CHLORHEXIDINE GLUCONATE CLOTH 2 % EX PADS
6.0000 | MEDICATED_PAD | Freq: Every day | CUTANEOUS | Status: DC
  Administered 2018-01-30: 6 via TOPICAL
  Filled 2018-01-29: qty 6

## 2018-01-29 MED ORDER — VANCOMYCIN HCL IN DEXTROSE 1-5 GM/200ML-% IV SOLN
1000.0000 mg | INTRAVENOUS | Status: DC
  Filled 2018-01-29: qty 200

## 2018-01-29 MED ORDER — SODIUM CHLORIDE 0.9 % IV SOLN
1.0000 g | INTRAVENOUS | Status: DC
  Filled 2018-01-29: qty 1

## 2018-01-29 MED ORDER — SODIUM CHLORIDE 0.9 % IV SOLN
2.0000 g | Freq: Once | INTRAVENOUS | Status: AC
  Administered 2018-01-29: 2 g via INTRAVENOUS
  Filled 2018-01-29: qty 2

## 2018-01-29 MED ORDER — INSULIN ASPART 100 UNIT/ML ~~LOC~~ SOLN
5.0000 [IU] | Freq: Once | SUBCUTANEOUS | Status: AC
  Administered 2018-01-29: 5 [IU] via INTRAVENOUS
  Filled 2018-01-29: qty 1

## 2018-01-29 MED ORDER — SODIUM BICARBONATE 8.4 % IV SOLN
50.0000 meq | Freq: Once | INTRAVENOUS | Status: AC
  Administered 2018-01-29: 50 meq via INTRAVENOUS
  Filled 2018-01-29: qty 50

## 2018-01-29 MED ORDER — CALCIUM GLUCONATE 10 % IV SOLN
1.0000 g | Freq: Once | INTRAVENOUS | Status: AC
  Administered 2018-01-29: 1 g via INTRAVENOUS
  Filled 2018-01-29: qty 10

## 2018-01-29 NOTE — ED Triage Notes (Signed)
Pt in via EMS from home with c/o breathing difficulty since this am. EMS reports 95% on RA, with exertion goes down to 88%.  Pt with swelling to his face.   125/80, HR 95%, 100% on 3L.

## 2018-01-29 NOTE — ED Notes (Signed)
Pre HD Treatment    01/29/18 2130  Vital Signs  Temp 98.8 F (37.1 C)  Temp Source Oral  Pulse Rate 94  Pulse Rate Source Monitor  Resp 20  BP 130/89  BP Location Left Arm  BP Method Automatic  Patient Position (if appropriate) Lying  Oxygen Therapy  SpO2 93 %  O2 Device Room Air  Pain Assessment  Pain Scale 0-10  Pain Score 10  Pain Type Acute pain  Pain Location Foot  Pain Orientation Right;Left  Pain Descriptors / Indicators Aching  Pain Frequency Intermittent  Pain Onset Progressive  Patients Stated Pain Goal 1  Pain Intervention(s) MD notified (Comment)  Multiple Pain Sites Yes  2nd Pain Site  Pain Location Throat  Dialysis Weight  Weight  (unable to weigh)  Type of Weight Pre-Dialysis  Time-Out for Hemodialysis  What Procedure? HD  Pt Identifiers(min of two) First/Last Name;MRN/Account#;Pt's DOB(use if MRN/Acct# not available  Correct Site? Yes  Correct Side? Yes  Correct Procedure? Yes  Consents Verified? Yes  Rad Studies Available? N/A  Safety Precautions Reviewed? Yes  Engineer, civil (consulting) Number 3  Station Number  (ED 17)  UF/Alarm Test Passed  Conductivity: Meter 14  Conductivity: Machine  13.8  pH 7.2  Reverse Osmosis RO 1  Normal Saline Lot Number [id number]  Dialyzer Lot Number [id number]  Disposable Set Lot Number [id number]  Machine Temperature 98.6 F (37 C)  Musician and Audible Yes  Blood Lines Intact and Secured Yes  Pre Treatment Patient Checks  Vascular access used during treatment Catheter  Hepatitis B Surface Antigen Results Negative  Date Hepatitis B Surface Antigen Drawn 06/16/17  Hepatitis B Surface Antibody  (>10)  Date Hepatitis B Surface Antibody Drawn 06/16/17  Hemodialysis Consent Verified Yes  Hemodialysis Standing Orders Initiated Yes  ECG (Telemetry) Monitor On Yes  Prime Ordered Normal Saline  Length of  DialysisTreatment -hour(s) 3 Hour(s)  Dialysis Treatment Comments Na 140  Dialyzer Elisio 17H NR   Dialysate 2K, 2.5 Ca  Dialysis Anticoagulant None  Dialysate Flow Ordered 600  Blood Flow Rate Ordered 400 mL/min  Ultrafiltration Goal 2 Liters  Pre Treatment Labs Other (Comment) (Vanc trough)  Dialysis Blood Pressure Support Ordered Normal Saline  Education / Care Plan  Dialysis Education Provided Yes  Documented Education in Care Plan Yes  Hemodialysis Catheter Right Internal jugular Double-lumen  No Placement Date or Time found.   Placed prior to admission: Yes  Orientation: Right  Access Location: Internal jugular  Hemodialysis Catheter Type: Double-lumen  Site Condition No complications  Blue Lumen Status Capped (Central line)  Red Lumen Status Capped (Central line)  Purple Lumen Status N/A  Dressing Type Gauze/Drain sponge  Dressing Status Clean;Dry;Intact

## 2018-01-29 NOTE — ED Notes (Signed)
Resumed care from samantha rn.  Dialysis nurse in with pt now.

## 2018-01-29 NOTE — ED Notes (Signed)
Received call from Dialysis RN stating that something is wrong with water in their machine. RN stated that MD is aware of situation and delay in care at this time.

## 2018-01-29 NOTE — Consult Note (Signed)
Pharmacy Antibiotic Note  Stanley [name] is a 57 y.o. male admitted on 01/29/2018 with pneumonia. ESRD patient - last HD on 11/16. Pharmacy has been consulted for Vancomycin + Cefepime dosing.   Plan: Patient received cefepime 2 g IV x 1 and Vancomycin 1 g x 1 in ED. Will order Cefepime 1 g IV q24h starting 11/19. Will order Vancomycin 1 g with HD. Goal trough 20-25. Will order VT prior to HD on 11/23.  Height: 6\' 3"  (190.5 cm) Weight: 180 lb (81.6 kg) IBW/kg (Calculated) : 84.5  Temp (24hrs), Avg:100 F (37.8 C), Min:100 F (37.8 C), Max:100 F (37.8 C)  Recent Labs  Lab 01/29/18 1525 01/29/18 1532  WBC 13.7*  --   CREATININE 8.72*  --   LATICACIDVEN  --  1.09    Estimated Creatinine Clearance: 10.7 mL/min (A) (by C-G formula based on SCr of 8.72 mg/dL (H)).    No Known Allergies  Antimicrobials this admission: 11/18 Cefepime >>  11/18 Vancomycin >>   Dose adjustments this admission: N/A  Microbiology results: 11/18 BCx: sent 11/18 Body fluid Cx sent  11/18 MRSA PCR: sent  Thank you for allowing pharmacy to be a part of this patient's care.  Paticia Stack 01/29/2018 5:31 PM

## 2018-01-29 NOTE — ED Triage Notes (Signed)
Patient presents to the ED for increased weakness and chills this morning.  Patient states he was recently admitted for pneumonia and states, "I think it's jumped back on me."  Patient reports feeling very cold in his home even though the heat was turned up and feeling very weak and tired.  Patient has full ascites to abdomen and reports last dialysis treatment was on Saturday.  Patient states he has been taking antibiotics as prescribed.

## 2018-01-29 NOTE — ED Notes (Signed)
HD Treatment Initiated    01/29/18 2152  Vital Signs  Pulse Rate 91  Oxygen Therapy  SpO2 94 %  During Hemodialysis Assessment  Blood Flow Rate (mL/min) 400 mL/min  Arterial Pressure (mmHg) -150 mmHg  Venous Pressure (mmHg) 180 mmHg  Transmembrane Pressure (mmHg) 60 mmHg  Ultrafiltration Rate (mL/min) 830 mL/min  Dialysate Flow Rate (mL/min) 600 ml/min  Conductivity: Machine  13.6  HD Safety Checks Performed Yes  Dialysis Fluid Bolus Normal Saline  Bolus Amount (mL) 250 mL  Intra-Hemodialysis Comments Tx initiated;Progressing as prescribed  Hemodialysis Catheter Right Internal jugular Double-lumen  No Placement Date or Time found.   Placed prior to admission: Yes  Orientation: Right  Access Location: Internal jugular  Hemodialysis Catheter Type: Double-lumen  Blue Lumen Status Infusing  Red Lumen Status Infusing  Purple Lumen Status N/A

## 2018-01-29 NOTE — H&P (Signed)
[facility] [place] NAME: Stanley [name]    MR#:  [id number]  DATE OF BIRTH:  [birth year]  DATE OF ADMISSION:  01/29/2018  PRIMARY CARE PHYSICIAN: Alene Mires Elyse Jarvis, MD   REQUESTING/REFERRING PHYSICIAN: Quentin Cornwall  CHIEF COMPLAINT:   Chief Complaint  Patient presents with  . Fever  . Weakness    HISTORY OF PRESENT ILLNESS: Kyal Arts  is a 57 y.o. male with a known history of alcohol abuse, CHF, liver cirrhosis, end-stage renal disease on hemodialysis, hyperlipidemia, hypertension, peripheral arterial disease-was admitted to hospital 2 weeks ago for healthcare associated pneumonia.  He was discharged home with oral doxycycline. He was feeling fine when sent home.  Finished his antibiotics few days ago.  Since today morning he has again low-grade fever and sore throat and some coughing and concerned with this he came to emergency room. His last hemodialysis was on Saturday.  Today his potassium was noted 7 and he had low-grade fever in the emergency room. ER physician started on broad-spectrum antibiotics and spoke to nephrologist on-call he suggested to admit for urgent hemodialysis tonight.   PAST MEDICAL HISTORY:   Past Medical History:  Diagnosis Date  . Alcohol abuse   . CHF (congestive heart failure) ([place])   . Cirrhosis ([place])   . Coronary artery disease 2009  . Drug abuse ([place])   . End stage renal disease on dialysis [facility]) NEPHROLOGIST-   [facility]  IN [place]   HEMODIALYSIS --   TUES/  THURS/  SAT  . Gastrointestinal bleed 06/13/2017   From chart...hx of multiple GI bleeds  . GERD (gastroesophageal reflux disease)   . Hyperlipidemia   . Hypertension   . PAD (peripheral artery disease) ([place])   . Renal insufficiency    Per pt, 32 oz fluid restriction per day  . S/P triple vessel bypass 06/09/2016   2009ish  . Suicidal ideation    & HOMICIDAL IDEATION --  06-16-2013   ADMITTED TO BEHAVIOR HEALTH    PAST SURGICAL  HISTORY:  Past Surgical History:  Procedure Laterality Date  . A/V FISTULAGRAM Right 06/06/2017   Procedure: A/V FISTULAGRAM;  Surgeon: Katha Cabal, MD;  Location: [place] CV LAB;  Service: Cardiovascular;  Laterality: Right;  . A/V SHUNT INTERVENTION N/A 06/06/2017   Procedure: A/V SHUNT INTERVENTION;  Surgeon: Katha Cabal, MD;  Location: [place] CV LAB;  Service: Cardiovascular;  Laterality: N/A;  . AGILE CAPSULE N/A 06/19/2016   Procedure: AGILE CAPSULE;  Surgeon: [name], MD;  Location: [facility] ENDOSCOPY;  Service: Endoscopy;  Laterality: N/A;  . COLONOSCOPY WITH PROPOFOL N/A 06/18/2016   Procedure: COLONOSCOPY WITH PROPOFOL;  Surgeon: [name], MD;  Location: [facility] ENDOSCOPY;  Service: Endoscopy;  Laterality: N/A;  . COLONOSCOPY WITH PROPOFOL N/A 08/12/2016   Procedure: COLONOSCOPY WITH PROPOFOL;  Surgeon: [name], MD;  Location: [facility] ENDOSCOPY;  Service: Endoscopy;  Laterality: N/A;  . COLONOSCOPY WITH PROPOFOL N/A 05/05/2017   Procedure: COLONOSCOPY WITH PROPOFOL;  Surgeon: Manya Silvas, MD;  Location: [facility] ENDOSCOPY;  Service: Endoscopy;  Laterality: N/A;  . CORONARY ANGIOPLASTY  ?   PT UNABLE TO TELL IF  BEFORE OR AFTER  CABG  . CORONARY ARTERY BYPASS GRAFT  2008  ([place] , [place])   3 VESSEL  . DIALYSIS FISTULA CREATION  LAST SURGERY  APPOX  2008  . ENTEROSCOPY N/A 05/10/2016   Procedure: ENTEROSCOPY;  Surgeon: Jerene Bears, MD;  Location: [place];  Service: Gastroenterology;  Laterality: N/A;  . ENTEROSCOPY N/A 08/12/2016   Procedure: ENTEROSCOPY;  Surgeon: [name], MD;  Location: [facility] ENDOSCOPY;  Service: Endoscopy;  Laterality: N/A;  . ENTEROSCOPY Left 06/03/2017   Procedure: ENTEROSCOPY;  Surgeon: Virgel Manifold, MD;  Location: [facility] ENDOSCOPY;  Service: Endoscopy;  Laterality: Left;  Procedure date will ultimately depend on when patient is medically optimized before the procedure, pending hemodialysis and blood transfusions etc. Will place  on schedule and change depending on clinical status.   . ENTEROSCOPY N/A 06/05/2017   Procedure: ENTEROSCOPY;  Surgeon: Virgel Manifold, MD;  Location: [facility] ENDOSCOPY;  Service: Endoscopy;  Laterality: N/A;  . ENTEROSCOPY N/A 06/15/2017   Procedure: Push ENTEROSCOPY;  Surgeon: [name], MD;  Location: [facility] ENDOSCOPY;  Service: Endoscopy;  Laterality: N/A;  . ESOPHAGOGASTRODUODENOSCOPY N/A 05/07/2015   Procedure: ESOPHAGOGASTRODUODENOSCOPY (EGD);  Surgeon: Hulen Luster, MD;  Location: [facility] ENDOSCOPY;  Service: Endoscopy;  Laterality: N/A;  . ESOPHAGOGASTRODUODENOSCOPY (EGD) WITH PROPOFOL N/A 05/17/2015   Procedure: ESOPHAGOGASTRODUODENOSCOPY (EGD) WITH PROPOFOL;  Surgeon: [name], MD;  Location: [facility] ENDOSCOPY;  Service: Endoscopy;  Laterality: N/A;  . ESOPHAGOGASTRODUODENOSCOPY (EGD) WITH PROPOFOL N/A 01/20/2016   Procedure: ESOPHAGOGASTRODUODENOSCOPY (EGD) WITH PROPOFOL;  Surgeon: [name], MD;  Location: [facility] ENDOSCOPY;  Service: Endoscopy;  Laterality: N/A;  . ESOPHAGOGASTRODUODENOSCOPY (EGD) WITH PROPOFOL N/A 04/17/2016   Procedure: ESOPHAGOGASTRODUODENOSCOPY (EGD) WITH PROPOFOL;  Surgeon: Lin Landsman, MD;  Location: [facility] ENDOSCOPY;  Service: Gastroenterology;  Laterality: N/A;  . ESOPHAGOGASTRODUODENOSCOPY (EGD) WITH PROPOFOL  05/09/2016   Procedure: ESOPHAGOGASTRODUODENOSCOPY (EGD) WITH PROPOFOL;  Surgeon: Jerene Bears, MD;  Location: [place];  Service: Endoscopy;;  . ESOPHAGOGASTRODUODENOSCOPY (EGD) WITH PROPOFOL N/A 06/16/2016   Procedure: ESOPHAGOGASTRODUODENOSCOPY (EGD) WITH PROPOFOL;  Surgeon: [name], MD;  Location: [facility] ENDOSCOPY;  Service: Endoscopy;  Laterality: N/A;  . ESOPHAGOGASTRODUODENOSCOPY (EGD) WITH PROPOFOL N/A 05/05/2017   Procedure: ESOPHAGOGASTRODUODENOSCOPY (EGD) WITH PROPOFOL;  Surgeon: Manya Silvas, MD;  Location[facility] END[facility]COPY;  Service: Endoscopy;  Laterality: N/A;  . ESOPHAGOGASTRODUODENOSCOPY (EGD) WITH PROPOFOL N/A 06/15/2017   Procedure:  ESOPHAGOGASTRODUODENOSCOPY (EGD) WITH PROPOFOL;  Surgeon: [name], MD;  Location: [facility] ENDOSCOPY;  Service: Endoscopy;  Laterality: N/A;  . GIVENS CAPSULE STUDY N/A 05/07/2016   Procedure: GIVENS CAPSULE STUDY;  Surgeon: Doran Stabler, MD;  Location: [place];  Service: Endoscopy;  Laterality: N/A;  . MANDIBULAR HARDWARE REMOVAL N/A 07/29/2013   Procedure: REMOVAL OF ARCH BARS;  Surgeon: Theodoro Kos, DO;  Location: [place];  Service: Plastics;  Laterality: N/A;  . ORIF MANDIBULAR FRACTURE N/A 06/05/2013   Procedure: REPAIR OF MANDIBULAR FRACTURE x 2 with maxillo-mandibular fixation ;  Surgeon: Theodoro Kos, DO;  Location: [place];  Service: Plastics;  Laterality: N/A;  . PARACENTESIS    . PERIPHERAL ARTERIAL STENT GRAFT Left     SOCIAL HISTORY:  Social History   Tobacco Use  . Smoking status: Current Every Day Smoker    Packs/day: 0.15    Years: 40.00    Pack years: 6.00    Types: Cigarettes  . Smokeless tobacco: Never Used  Substance Use Topics  . Alcohol use: No    Comment: pt reports quitting after learning about cirrhosis    FAMILY HISTORY:  Family History  Problem Relation Age of Onset  . Colon cancer Mother   . Cancer Father   . Cancer Sister   . Kidney disease Brother     DRUG ALLERGIES: No Known Allergies  REVIEW OF SYSTEMS:   CONSTITUTIONAL: Low-grade fever,  no fatigue or weakness.  EYES: No blurred or double vision.  EARS, NOSE, AND THROAT: No tinnitus or ear pain.  RESPIRATORY: No cough, shortness of breath, wheezing or hemoptysis.  CARDIOVASCULAR: No chest pain, orthopnea, edema.  GASTROINTESTINAL: No nausea, vomiting, diarrhea or abdominal pain.  Has abdominal distention. GENITOURINARY: No dysuria, hematuria.  ENDOCRINE: No polyuria, nocturia,  HEMATOLOGY: No anemia, easy bruising or bleeding SKIN: No rash or lesion. MUSCULOSKELETAL: No joint pain or arthritis.   NEUROLOGIC: No tingling, numbness, weakness.  PSYCHIATRY: No  anxiety or depression.   MEDICATIONS AT HOME:  Prior to Admission medications   Medication Sig Start Date End Date Taking? Authorizing Provider  budesonide-formoterol (SYMBICORT) 160-4.5 MCG/ACT inhaler Inhale 2 puffs into the lungs daily. 11/16/16  Yes Vaughan Basta, MD  calcium acetate (PHOSLO) 667 MG capsule Take 2,001 mg by mouth 3 (three) times daily.   Yes [provider]  folic acid (FOLVITE) 1 MG tablet Take 1 tablet (1 mg total) by mouth daily. 10/31/17  Yes Fritzi Mandes, MD  gabapentin (NEURONTIN) 300 MG capsule Take 300 mg by mouth 3 (three) times daily.  08/16/16  Yes [provider]  ipratropium-albuterol (DUONEB) 0.5-2.5 (3) MG/3ML SOLN Take 3 mLs by nebulization every 6 (six) hours as needed. Patient taking differently: Take 3 mLs by nebulization every 6 (six) hours as needed (for shortness of breath/wheezing).  11/28/16  Yes Wieting, Richard, MD  multivitamin (RENA-VIT) TABS tablet Take 1 tablet by mouth at bedtime. 10/31/17  Yes Fritzi Mandes, MD  mupirocin ointment (BACTROBAN) 2 % Place 1 application into the nose 2 (two) times daily. 01/19/18  Yes Vaughan Basta, MD  omeprazole (PRILOSEC) 40 MG capsule Take 40 mg by mouth daily. 09/08/17  Yes [provider]  Spacer/Aero Chamber Mouthpiece MISC 1 Units by Does not apply route every 4 (four) hours as needed (wheezing). 02/19/17  Yes Darel Hong, MD  tiotropium (SPIRIVA HANDIHALER) 18 MCG inhalation capsule Place 1 capsule (18 mcg total) into inhaler and inhale daily. 11/16/16 01/29/18 Yes Vaughan Basta, MD  predniSONE (STERAPRED UNI-PAK 21 TAB) 10 MG (21) TBPK tablet Take 6 tabs first day, 5 tab on day 2, then 4 on day 3rd, 3 tabs on day 4th , 2 tab on day 5th, and 1 tab on 6th day. Patient not taking: Reported on 01/29/2018 01/19/18   Vaughan Basta, MD      PHYSICAL EXAMINATION:   VITAL SIGNS: Blood pressure 118/88, pulse 88, temperature 100 F (37.8 C), temperature source  Oral, resp. rate (!) 22, height 6\' 3"  (1.905 m), weight 81.6 kg, SpO2 92 %.  GENERAL:  57 y.o.-year-old patient lying in the bed with no acute distress.  EYES: Pupils equal, round, reactive to light and accommodation. No scleral icterus. Extraocular muscles intact.  HEENT: Head atraumatic, normocephalic. Oropharynx and nasopharynx clear.  NECK:  Supple, no jugular venous distention. No thyroid enlargement, no tenderness.  LUNGS: Normal breath sounds bilaterally, no wheezing, rales,rhonchi or crepitation. No use of accessory muscles of respiration.  CARDIOVASCULAR: S1, S2 normal. No murmurs, rubs, or gallops.  ABDOMEN: Soft, nontender, distended. Bowel sounds present. No organomegaly or mass.  EXTREMITIES: No pedal edema, cyanosis, or clubbing.  NEUROLOGIC: Cranial nerves II through XII are intact. Muscle strength 5/5 in all extremities. Sensation intact. Gait not checked.  PSYCHIATRIC: The patient is alert and oriented x 3.  SKIN: No obvious rash, lesion, or ulcer.   LABORATORY PANEL:   CBC Recent Labs  Lab 01/29/18 1525  WBC 13.7*  HGB 10.2*  HCT 36.4*  PLT 225  MCV 88.6  MCH 24.8*  MCHC 28.0*  RDW 21.3*  LYMPHSABS 0.8  MONOABS 1.2*  EOSABS 0.1  BASOSABS 0.0   ------------------------------------------------------------------------------------------------------------------  Chemistries  Recent Labs  Lab 01/29/18 1525  NA 139  K 7.0*  CL 103  CO2 25  GLUCOSE 101*  BUN 48*  CREATININE 8.72*  CALCIUM 8.6*  AST 18  ALT 27  ALKPHOS 116  BILITOT 0.9   ------------------------------------------------------------------------------------------------------------------ estimated creatinine clearance is 10.7 mL/min (A) (by C-G formula based on SCr of 8.72 mg/dL (H)). ------------------------------------------------------------------------------------------------------------------ No results for input(s): TSH, T4TOTAL, T3FREE, THYROIDAB in the last 72 hours.  Invalid  input(s): FREET3   Coagulation profile Recent Labs  Lab 01/29/18 1525  INR 1.08   ------------------------------------------------------------------------------------------------------------------- No results for input(s): DDIMER in the last 72 hours. -------------------------------------------------------------------------------------------------------------------  Cardiac Enzymes No results for input(s): CKMB, TROPONINI, MYOGLOBIN in the last 168 hours.  Invalid input(s): CK ------------------------------------------------------------------------------------------------------------------ Invalid input(s): POCBNP  ---------------------------------------------------------------------------------------------------------------  Urinalysis    Component Value Date/Time   COLORURINE YELLOW (A) 05/03/2015 0815   APPEARANCEUR CLEAR (A) 05/03/2015 0815   APPEARANCEUR Clear 11/13/2013 1314   LABSPEC 1.008 05/03/2015 0815   LABSPEC 1.014 11/13/2013 1314   PHURINE 9.0 (H) 05/03/2015 0815   GLUCOSEU NEGATIVE 05/03/2015 0815   GLUCOSEU 50 mg/dL 11/13/2013 1314   HGBUR NEGATIVE 05/03/2015 0815   BILIRUBINUR NEGATIVE 05/03/2015 0815   BILIRUBINUR Negative 11/13/2013 1314   KETONESUR NEGATIVE 05/03/2015 0815   PROTEINUR 100 (A) 05/03/2015 0815   UROBILINOGEN 0.2 06/22/2014 1230   NITRITE NEGATIVE 05/03/2015 0815   LEUKOCYTESUR NEGATIVE 05/03/2015 0815   LEUKOCYTESUR Negative 11/13/2013 1314     RADIOLOGY: Dg Chest 2 View  Result Date: 01/29/2018 CLINICAL DATA:  Shortness of breath EXAM: CHEST - 2 VIEW COMPARISON:  01/16/2018 FINDINGS: Mild bilateral diffuse interstitial thickening. No pleural effusion or pneumothorax. No focal consolidation. Stable cardiomegaly. Dual lumen right-sided central venous catheter with the tip projecting over the SVC. Prior median sternotomy. No acute osseous abnormality. IMPRESSION: Cardiomegaly with mild pulmonary vascular congestion. Electronically  Signed   By: Kathreen Devoid   On: 01/29/2018 15:40    EKG: Orders placed or performed during the hospital encounter of 01/29/18  . ED EKG  . ED EKG    IMPRESSION AND PLAN:  *Hyperkalemia Patient need urgent hemodialysis.  ER physician had already spoke to nephrologist.  *Suspected healthcare associated pneumonia This is diagnosis suspected by ER physician due to patient's low-grade fever and sore throat with cough. Chest x-ray shows some pulmonary edema. Patient was recently treated in hospital and then by oral doxycycline course for the same diagnosis.  Finished antibiotics few days ago. Currently his vitals are stable and does not appear sepsis.  I would not order continuing antibiotics but would like to get a CT scan on the chest to have better idea.  *Liver cirrhosis and ascites He had a sciatic tap done during last admission where the labs were not sent but 5 L fluid was removed. Today the ER physician had done diagnostic paracentesis and sent to sample for the culture.  *End-stage renal disease on Nephrologist is aware about his need of dialysis tonight.  *COPD Currently no exacerbation symptoms.  Continue to monitor.  All the records are reviewed and case discussed with ED provider. Management plans discussed with the patient, family and they are in agreement.  CODE STATUS: Full code. Code Status History    Date Active Date Inactive Code Status  Order ID Comments User Context   01/15/2018 1437 01/19/2018 1251 Full Code [id number]  Vaughan Basta, MD Inpatient   12/23/2017 2225 12/26/2017 1846 Full Code [id number]  Amelia Jo, MD Inpatient   12/09/2017 1043 12/11/2017 1617 Full Code [id number]  Epifanio Lesches, MD ED   11/27/2017 1122 11/28/2017 1827 Full Code [id number]  Demetrios Loll, MD Inpatient   11/18/2017 2008 11/20/2017 1853 Full Code [id number]  Saundra Shelling, MD Inpatient   10/30/2017 0007 10/31/2017 1724 Full Code [id number]  Amelia Jo, MD Inpatient   06/12/2017  1707 06/16/2017 1704 Full Code [id number]  Nicholes Mango, MD Inpatient   06/02/2017 1603 06/06/2017 2222 Full Code [id number]  Demetrios Loll, MD Inpatient   05/24/2017 1831 05/25/2017 1921 Full Code [id number]  Saundra Shelling, MD ED   05/03/2017 1331 05/05/2017 1806 Full Code [id number]  Bettey Costa, MD Inpatient   04/01/2017 0947 04/03/2017 1156 Full Code [id number]  Loletha Grayer, MD ED   03/09/2017 0010 03/09/2017 1928 Full Code [id number]  Lance Coon, MD Inpatient   03/03/2017 2027 03/04/2017 1920 Full Code [id number]  Demetrios Loll, MD Inpatient   02/28/2017 2254 03/02/2017 1519 Full Code [id number]  Lance Coon, MD Inpatient   02/22/2017 1721 02/24/2017 1359 Full Code [id number]  Dustin Flock, MD Inpatient   01/22/2017 2337 01/25/2017 1522 Full Code [id number]  Gorden Harms, MD Inpatient   01/07/2017 1445 01/13/2017 1812 Full Code [id number]  Idelle Crouch, MD Inpatient   12/22/2016 1431 12/24/2016 1804 Full Code [id number]  Hillary Bow, MD ED   12/20/2016 2020 12/21/2016 1853 Full Code [id number]  Demetrios Loll, MD Inpatient   11/26/2016 1030 11/28/2016 1522 Full Code [id number]  Henreitta Leber, MD Inpatient   11/15/2016 1930 11/16/2016 1822 Full Code [id number]  Fritzi Mandes, MD Inpatient   10/29/2016 1453 10/30/2016 1506 Full Code [id number]  Loletha Grayer, MD ED   09/15/2016 1757 09/16/2016 1817 Full Code [id number]  Dustin Flock, MD ED   08/16/2016 0945 08/18/2016 1816 Full Code [id number]  Hillary Bow, MD ED   08/09/2016 1659 08/12/2016 2034 Full Code [id number]  Fritzi Mandes, MD Inpatient   07/03/2016 2154 07/05/2016 2122 Full Code [id number]  Demetrios Loll, MD Inpatient   06/24/2016 0423 06/26/2016 1546 Full Code [id number]  Saundra Shelling, MD Inpatient   06/14/2016 1453 06/19/2016 2104 Full Code [id number]  Hillary Bow, MD ED   05/31/2016 0642 06/01/2016 2130 Full Code [id number]  Saundra Shelling, MD Inpatient   05/05/2016 2309 05/10/2016 2157 Full Code [id number]  Vianne Bulls, MD Inpatient   04/16/2016 0356 04/17/2016  1827 Full Code [id number]  Saundra Shelling, MD Inpatient   01/19/2016 1513 01/22/2016 1414 Full Code [id number]  Loletha Grayer, MD ED   09/24/2015 1033 09/25/2015 1331 Full Code [id number]  Loletha Grayer, MD ED   05/16/2015 1417 05/17/2015 2057 Full Code [id number]  Idelle Crouch, MD Inpatient   04/30/2015 2008 05/08/2015 1940 Full Code [id number]  Vaughan Basta, MD Inpatient   04/06/2015 0507 04/07/2015 1736 Full Code [id number]  Lance Coon, MD Inpatient   03/23/2015 1107 03/27/2015 1246 Full Code [id number]  Bettey Costa, MD Inpatient   06/19/2013 1132 06/21/2013 1749 Full Code [id number]  Cristal Ford, DO Inpatient   06/16/2013 1726 06/19/2013 1132 Full Code [id number]  Margarita Mail, PA-C ED   06/05/2013 1533 06/06/2013 1856 Full Code [id number]  Theodoro Kos, DO Inpatient   06/02/2013 0901 06/05/2013 1533 Full Code [id number]  Velvet Bathe, MD Inpatient   06/02/2013 0840 06/02/2013 0901 Full Code [id number]  Velvet Bathe, MD Inpatient  TOTAL TIME TAKING CARE OF THIS PATIENT: 45 minutes.    Vaughan Basta M.D on 01/29/2018   Between 7am to 6pm - Pager - [phone]  After 6pm go to [email or web address] - password [email or web address] [facility]  Office  [phone]  CC: Primary care physician; [name], MD   Note: This dictation was prepared with Dragon dictation along with smaller phrase technology. Any transcriptional errors that result from this process are unintentional.

## 2018-01-29 NOTE — ED Notes (Signed)
Pre HD Assessment    01/29/18 2145  Neurological  Level of Consciousness Alert  Orientation Level Oriented X4  Respiratory  Respiratory Pattern Regular;Labored;Dyspnea at rest;Symmetrical  Chest Assessment Chest expansion symmetrical  Bilateral Breath Sounds Expiratory wheezes;Coarse crackles  Cough Non-productive  Sputum Amount None  Cardiac  Pulse Regular  Heart Sounds S1, S2  Jugular Venous Distention (JVD) No  ECG Monitor Yes  Cardiac Rhythm NSR  Antiarrhythmic device No  Vascular  R Radial Pulse +2  L Radial Pulse +2  R Dorsalis Pedis Pulse +1  L Dorsalis Pedis Pulse +1  Edema Generalized  Generalized Edema +2  RLE Edema Non-pitting  LLE Edema Non-Pitting  Integumentary  Integumentary (WDL) X  Skin Color Appropriate for ethnicity  Skin Condition Dry  Skin Integrity Catheter entry/exit site;Surgical Incision (see LDA)  Musculoskeletal  Musculoskeletal (WDL) X  Generalized Weakness Yes  Assistive Device None  GU Assessment  Genitourinary (WDL) X (HD pt)  Genitourinary Symptoms Oliguria  Psychosocial  Psychosocial (WDL) X  Patient Behaviors Agitated;Irritable  Needs Expressed Physical  Emotional support given Given to patient

## 2018-01-29 NOTE — ED Notes (Signed)
Pt given meal tray and ginger ale 

## 2018-01-29 NOTE — ED Notes (Signed)
Report given to Amy RN.

## 2018-01-29 NOTE — ED Notes (Signed)
Dialysis RN on her way up to perform emergent dialysis on pt at this time.

## 2018-01-29 NOTE — ED Provider Notes (Signed)
[facility] Emergency Department Provider Note    First MD Initiated Contact with Patient 01/29/18 1625     (approximate)  I have reviewed the triage vital signs and the nursing notes.   HISTORY  Chief Complaint Fever and Weakness    HPI Stanley [name] is a 57 y.o. male well-known to this facility presents with generalized malaise sore throat cough fevers and chills.  Patient recently admitted to the hospital for healthcare associated pneumonia.  States his last dialysis session was on Saturday.  Does feel like he is more short of breath particularly with any exertion.  Does feel his belly is more swollen and is noting more swelling in his face.    Past Medical History:  Diagnosis Date  . Alcohol abuse   . CHF (congestive heart failure) ([place])   . Cirrhosis ([place])   . Coronary artery disease 2009  . Drug abuse ([place])   . End stage renal disease on dialysis [facility]) NEPHROLOGIST-   DR [facility]  IN [place]   HEMODIALYSIS --   TUES/  THURS/  SAT  . Gastrointestinal bleed 06/13/2017   From chart...hx of multiple GI bleeds  . GERD (gastroesophageal reflux disease)   . Hyperlipidemia   . Hypertension   . PAD (peripheral artery disease) ([place])   . Renal insufficiency    Per pt, 32 oz fluid restriction per day  . S/P triple vessel bypass 06/09/2016   2009ish  . Suicidal ideation    & HOMICIDAL IDEATION --  06-16-2013   ADMITTED TO BEHAVIOR HEALTH   Family History  Problem Relation Age of Onset  . Colon cancer Mother   . Cancer Father   . Cancer Sister   . Kidney disease Brother    Past Surgical History:  Procedure Laterality Date  . A/V FISTULAGRAM Right 06/06/2017   Procedure: A/V FISTULAGRAM;  Surgeon: Katha Cabal, MD;  Location: [facility];  Service: Cardiovascular;  Laterality: Right;  . A/V SHUNT INTERVENTION N/A 06/06/2017   Procedure: A/V SHUNT INTERVENTION;  Surgeon: Katha Cabal, MD;  Location: [facility];   Service: Cardiovascular;  Laterality: N/A;  . AGILE CAPSULE N/A 06/19/2016   Procedure: AGILE CAPSULE;  Surgeon: [name], MD;  Location: [facility] ENDOSCOPY;  Service: Endoscopy;  Laterality: N/A;  . COLONOSCOPY WITH PROPOFOL N/A 06/18/2016   Procedure: COLONOSCOPY WITH PROPOFOL;  Surgeon: [name], MD;  Location: [facility] ENDOSCOPY;  Service: Endoscopy;  Laterality: N/A;  . COLONOSCOPY WITH PROPOFOL N/A 08/12/2016   Procedure: COLONOSCOPY WITH PROPOFOL;  Surgeon: [name], MD;  Location: [facility] ENDOSCOPY;  Service: Endoscopy;  Laterality: N/A;  . COLONOSCOPY WITH PROPOFOL N/A 05/05/2017   Procedure: COLONOSCOPY WITH PROPOFOL;  Surgeon: Manya Silvas, MD;  Location: [facility] ENDOSCOPY;  Service: Endoscopy;  Laterality: N/A;  . CORONARY ANGIOPLASTY  ?   PT UNABLE TO TELL IF  BEFORE OR AFTER  CABG  . CORONARY ARTERY BYPASS GRAFT  2008  ([place] , [place])   3 VESSEL  . DIALYSIS FISTULA CREATION  LAST SURGERY  APPOX  2008  . ENTEROSCOPY N/A 05/10/2016   Procedure: ENTEROSCOPY;  Surgeon: Jerene Bears, MD;  Location: [place];  Service: Gastroenterology;  Laterality: N/A;  . ENTEROSCOPY N/A 08/12/2016   Procedure: ENTEROSCOPY;  Surgeon: [name], MD;  Location: [facility] ENDOSCOPY;  Service: Endoscopy;  Laterality: N/A;  . ENTEROSCOPY Left 06/03/2017   Procedure: ENTEROSCOPY;  Surgeon: Virgel Manifold, MD;  Location: [facility] ENDOSCOPY;  Service: Endoscopy;  Laterality: Left;  Procedure date will ultimately depend on when patient is medically optimized before the procedure, pending hemodialysis and blood transfusions etc. Will place on schedule and change depending on clinical status.   . ENTEROSCOPY N/A 06/05/2017   Procedure: ENTEROSCOPY;  Surgeon: Virgel Manifold, MD;  Location: [facility] ENDOSCOPY;  Service: Endoscopy;  Laterality: N/A;  . ENTEROSCOPY N/A 06/15/2017   Procedure: Push ENTEROSCOPY;  Surgeon: [name], MD;  Location: [facility] ENDOSCOPY;  Service: Endoscopy;  Laterality: N/A;  .  ESOPHAGOGASTRODUODENOSCOPY N/A 05/07/2015   Procedure: ESOPHAGOGASTRODUODENOSCOPY (EGD);  Surgeon: Hulen Luster, MD;  Location: [facility] ENDOSCOPY;  Service: Endoscopy;  Laterality: N/A;  . ESOPHAGOGASTRODUODENOSCOPY (EGD) WITH PROPOFOL N/A 05/17/2015   Procedure: ESOPHAGOGASTRODUODENOSCOPY (EGD) WITH PROPOFOL;  Surgeon: [name], MD;  Location: [facility] ENDOSCOPY;  Service: Endoscopy;  Laterality: N/A;  . ESOPHAGOGASTRODUODENOSCOPY (EGD) WITH PROPOFOL N/A 01/20/2016   Procedure: ESOPHAGOGASTRODUODENOSCOPY (EGD) WITH PROPOFOL;  Surgeon: [name], MD;  Location: [facility] ENDOSCOPY;  Service: Endoscopy;  Laterality: N/A;  . ESOPHAGOGASTRODUODENOSCOPY (EGD) WITH PROPOFOL N/A 04/17/2016   Procedure: ESOPHAGOGASTRODUODENOSCOPY (EGD) WITH PROPOFOL;  Surgeon: Lin Landsman, MD;  Location: [facility] ENDOSCOPY;  Service: Gastroenterology;  Laterality: N/A;  . ESOPHAGOGASTRODUODENOSCOPY (EGD) WITH PROPOFOL  05/09/2016   Procedure: ESOPHAGOGASTRODUODENOSCOPY (EGD) WITH PROPOFOL;  Surgeon: Jerene Bears, MD;  Location: [facility];  Service: Endoscopy;;  . ESOPHAGOGASTRODUODENOSCOPY (EGD) WITH PROPOFOL N/A 06/16/2016   Procedure: ESOPHAGOGASTRODUODENOSCOPY (EGD) WITH PROPOFOL;  Surgeon: [name], MD;  Location: [facility] ENDOSCOPY;  Service: Endoscopy;  Laterality: N/A;  . ESOPHAGOGASTRODUODENOSCOPY (EGD) WITH PROPOFOL N/A 05/05/2017   Procedure: ESOPHAGOGASTRODUODENOSCOPY (EGD) WITH PROPOFOL;  Surgeon: Manya Silvas, MD;  Location: [facility] ENDOSCOPY;  Service: Endoscopy;  Laterality: N/A;  . ESOPHAGOGASTRODUODENOSCOPY (EGD) WITH PROPOFOL N/A 06/15/2017   Procedure: ESOPHAGOGASTRODUODENOSCOPY (EGD) WITH PROPOFOL;  Surgeon: [name], MD;  Location: [facility] ENDOSCOPY;  Service: Endoscopy;  Laterality: N/A;  . GIVENS CAPSULE STUDY N/A 05/07/2016   Procedure: GIVENS CAPSULE STUDY;  Surgeon: Doran Stabler, MD;  Location: [facility];  Service: Endoscopy;  Laterality: N/A;  . MANDIBULAR HARDWARE REMOVAL N/A 07/29/2013   Procedure:  REMOVAL OF ARCH BARS;  Surgeon: Theodoro Kos, DO;  Location: [facility];  Service: Plastics;  Laterality: N/A;  . ORIF MANDIBULAR FRACTURE N/A 06/05/2013   Procedure: REPAIR OF MANDIBULAR FRACTURE x 2 with maxillo-mandibular fixation ;  Surgeon: Theodoro Kos, DO;  Location: [place];  Service: Plastics;  Laterality: N/A;  . PARACENTESIS    . PERIPHERAL ARTERIAL STENT GRAFT Left    Patient Active Problem List   Diagnosis Date Noted  . Healthcare-associated pneumonia 01/15/2018  . Acute on chronic respiratory failure ([place]) 01/15/2018  . Acute pulmonary edema ([place]) 12/23/2017  . Acute respiratory failure ([place]) 10/29/2017  . ESRD (end stage renal disease) on dialysis ([place]) 07/28/2017  . Protein-calorie malnutrition, severe 06/14/2017  . Encounter for dialysis [facility])   . Palliative care by specialist   . Goals of care, counseling/discussion   . Malnutrition of moderate degree 06/05/2017  . Secondary esophageal varices without bleeding ([place])   . Stomach irritation   . Idiopathic esophageal varices without bleeding ([place])   . Alcoholic hepatitis with ascites 05/24/2017  . ESRD (end stage renal disease) ([place]) 04/28/2017  . Uremia 03/08/2017  . ESRD on hemodialysis ([place]) 03/03/2017  . Weakness 02/28/2017  . Hypocalcemia 02/22/2017  . Shortness of breath 11/26/2016  . COPD (chronic obstructive pulmonary disease) ([place]) 10/30/2016  . COPD exacerbation ([place]) 10/29/2016  . Anemia   .  Heme positive stool   . Ulceration of intestine   . Benign neoplasm of transverse colon   . Acute gastrointestinal hemorrhage   . Esophageal candidiasis ([place])   . Angiodysplasia of intestinal tract   . Acute respiratory failure with hypoxia ([place]) 07/03/2016  . GI bleeding 06/24/2016  . Rectal bleeding 06/14/2016  . Anemia of chronic disease 06/01/2016  . MRSA carrier 06/01/2016  . Chronic renal failure 05/23/2016  . Ischemic heart disease 05/23/2016  . Angiodysplasia of small intestine   .  Melena   . Small bowel bleed not requiring more than 4 units of blood in 24 hours, ICU, or surgery   . Anemia due to chronic blood loss   . Abdominal pain 05/05/2016  . Acute posthemorrhagic anemia 04/17/2016  . Gastrointestinal bleed 04/17/2016  . History of esophagogastroduodenoscopy (EGD) 04/17/2016  . Elevated troponin 04/17/2016  . Alcohol abuse 04/17/2016  . Upper GI bleed 01/19/2016  . Blood in stool   . Angiodysplasia of stomach and duodenum with hemorrhage   . Gastritis   . Reflux esophagitis   . GI bleed 05/16/2015  . Acute GI bleeding   . Symptomatic anemia 04/30/2015  . HTN (hypertension) 04/06/2015  . GERD (gastroesophageal reflux disease) 04/06/2015  . HLD (hyperlipidemia) 04/06/2015  . Dyspnea 04/06/2015  . Cirrhosis of liver with ascites ([place]) 04/06/2015  . Ascites 04/06/2015  . GIB (gastrointestinal bleeding) 03/23/2015  . Homicidal ideation 06/19/2013  . Suicidal intent 06/19/2013  . Homicidal ideations 06/19/2013  . Hyperkalemia 06/16/2013  . Mandible fracture ([place]) 06/05/2013  . Fracture, mandible ([place]) 06/02/2013  . Coronary atherosclerosis of native coronary artery 06/02/2013  . ESRD on dialysis ([place]) 06/02/2013  . Mandible open fracture ([place]) 06/02/2013      Prior to Admission medications   Medication Sig Start Date End Date Taking? Authorizing Provider  budesonide-formoterol (SYMBICORT) 160-4.5 MCG/ACT inhaler Inhale 2 puffs into the lungs daily. 11/16/16   Vaughan Basta, MD  calcium acetate (PHOSLO) 667 MG capsule Take 2,001 mg by mouth 3 (three) times daily.    [provider]  ferrous sulfate 325 (65 FE) MG tablet Take 1 tablet by mouth 3 (three) times daily. 01/21/17   [provider]  folic acid (FOLVITE) 1 MG tablet Take 1 tablet (1 mg total) by mouth daily. 10/31/17   Fritzi Mandes, MD  gabapentin (NEURONTIN) 300 MG capsule Take 300 mg by mouth 3 (three) times daily.  08/16/16   [provider]    ipratropium-albuterol (DUONEB) 0.5-2.5 (3) MG/3ML SOLN Take 3 mLs by nebulization every 6 (six) hours as needed. 11/28/16   Loletha Grayer, MD  multivitamin (RENA-VIT) TABS tablet Take 1 tablet by mouth at bedtime. 10/31/17   Fritzi Mandes, MD  mupirocin ointment (BACTROBAN) 2 % Place 1 application into the nose 2 (two) times daily. 01/19/18   Vaughan Basta, MD  omeprazole (PRILOSEC) 40 MG capsule Take 40 mg by mouth daily. 09/08/17   [provider]  predniSONE (STERAPRED UNI-PAK 21 TAB) 10 MG (21) TBPK tablet Take 6 tabs first day, 5 tab on day 2, then 4 on day 3rd, 3 tabs on day 4th , 2 tab on day 5th, and 1 tab on 6th day. 01/19/18   Vaughan Basta, MD  Spacer/Aero Chamber Mouthpiece MISC 1 Units by Does not apply route every 4 (four) hours as needed (wheezing). 02/19/17   Darel Hong, MD  SPIRIVA HANDIHALER 18 MCG inhalation capsule Place 1 capsule into inhaler and inhale daily. 01/04/18   [provider]  tiotropium (SPIRIVA HANDIHALER) 18 MCG inhalation capsule Place 1 capsule (18 mcg total) into inhaler and inhale daily. 11/16/16 12/23/17  Vaughan Basta, MD    Allergies Patient has no known allergies.    Social History Social History   Tobacco Use  . Smoking status: Current Every Day Smoker    Packs/day: 0.15    Years: 40.00    Pack years: 6.00    Types: Cigarettes  . Smokeless tobacco: Never Used  Substance Use Topics  . Alcohol use: No    Comment: pt reports quitting after learning about cirrhosis  . Drug use: No    Frequency: 7.0 times per week    Types: Marijuana, Cocaine    Review of Systems Patient denies headaches, rhinorrhea, blurry vision, numbness, shortness of breath, chest pain, edema, cough, abdominal pain, nausea, vomiting, diarrhea, dysuria, fevers, rashes or hallucinations unless otherwise stated above in HPI. ____________________________________________   PHYSICAL EXAM:  VITAL SIGNS: Vitals:   01/29/18 1700  01/29/18 1730  BP: 136/87 (!) 140/96  Pulse: 93 96  Resp: 17 (!) 23  Temp:    SpO2: 99% 96%    Constitutional: Alert and oriented.  Eyes: Conjunctivae are normal.  Head: Atraumatic. Nose: No congestion/rhinnorhea. Mouth/Throat: Mucous membranes are moist.   Neck: No stridor. Painless ROM.  Cardiovascular: Normal rate, regular rhythm. Grossly normal heart sounds.  Good peripheral circulation. Respiratory: mildly increased respiratory effort.  No retractions. Lungs with coarse bibasilar crackles requiring supplmental o2 Gastrointestinal: Soft. Mild distention with ttp diffusely. No abdominal bruits. No CVA tenderness. Genitourinary:  Musculoskeletal: No lower extremity tenderness nor edema.  No joint effusions. Neurologic:  Normal speech and language. No gross focal neurologic deficits are appreciated. No facial droop Skin:  Skin is warm, dry and intact. No rash noted. Psychiatric: Mood and affect are normal. Speech and behavior are normal.  ____________________________________________   LABS (all labs ordered are listed, but only abnormal results are displayed)  Results for orders placed or performed during the hospital encounter of 01/29/18 (from the past 24 hour(s))  Comprehensive metabolic panel     Status: Abnormal   Collection Time: 01/29/18  3:25 PM  Result Value Ref Range   Sodium 139 135 - 145 mmol/L   Potassium 7.0 (HH) 3.5 - 5.1 mmol/L   Chloride 103 98 - 111 mmol/L   CO2 25 22 - 32 mmol/L   Glucose, Bld 101 (H) 70 - 99 mg/dL   BUN 48 (H) 6 - 20 mg/dL   Creatinine, Ser 8.72 (H) 0.61 - 1.24 mg/dL   Calcium 8.6 (L) 8.9 - 10.3 mg/dL   Total Protein 6.8 6.5 - 8.1 g/dL   Albumin 3.1 (L) 3.5 - 5.0 g/dL   AST 18 15 - 41 U/L   ALT 27 0 - 44 U/L   Alkaline Phosphatase 116 38 - 126 U/L   Total Bilirubin 0.9 0.3 - 1.2 mg/dL   GFR calc non Af Amer 6 (L) >60 mL/min   GFR calc Af Amer 7 (L) >60 mL/min   Anion gap 11 5 - 15  CBC with Differential     Status: Abnormal    Collection Time: 01/29/18  3:25 PM  Result Value Ref Range   WBC 13.7 (H) 4.0 - 10.5 K/uL   RBC 4.11 (L) 4.22 - 5.81 MIL/uL   Hemoglobin 10.2 (L) 13.0 - 17.0 g/dL   HCT 36.4 (L) 39.0 - 52.0 %   MCV 88.6 80.0 - 100.0 fL   MCH 24.8 (  L) 26.0 - 34.0 pg   MCHC 28.0 (L) 30.0 - 36.0 g/dL   RDW 21.3 (H) 11.5 - 15.5 %   Platelets 225 150 - 400 K/uL   nRBC 0.0 0.0 - 0.2 %   Neutrophils Relative % 83 %   Neutro Abs 11.4 (H) 1.7 - 7.7 K/uL   Lymphocytes Relative 6 %   Lymphs Abs 0.8 0.7 - 4.0 K/uL   Monocytes Relative 8 %   Monocytes Absolute 1.2 (H) 0.1 - 1.0 K/uL   Eosinophils Relative 0 %   Eosinophils Absolute 0.1 0.0 - 0.5 K/uL   Basophils Relative 0 %   Basophils Absolute 0.0 0.0 - 0.1 K/uL   Immature Granulocytes 3 %   Abs Immature Granulocytes 0.34 (H) 0.00 - 0.07 K/uL   Schistocytes PRESENT    Polychromasia PRESENT   Protime-INR     Status: None   Collection Time: 01/29/18  3:25 PM  Result Value Ref Range   Prothrombin Time 13.9 11.4 - 15.2 seconds   INR 1.08   CG4 I-STAT (Lactic acid)     Status: None   Collection Time: 01/29/18  3:32 PM  Result Value Ref Range   Lactic Acid, Venous 1.09 0.5 - 1.9 mmol/L   ____________________________________________  EKG My review and personal interpretation at Time: 17:03   Indication: hyperkalemia  Rate: 90  Rhythm: sinus Axis: normal Other: nonspeicifc st and t wave abn, no stemi ____________________________________________  RADIOLOGY  I personally reviewed all radiographic images ordered to evaluate for the above acute complaints and reviewed radiology reports and findings.  These findings were personally discussed with the patient.  Please see medical record for radiology report.  ____________________________________________   PROCEDURES  Procedure(s) performed:  .Critical Care Performed by: Merlyn Lot, MD Authorized by: Merlyn Lot, MD   Critical care provider statement:    Critical care time (minutes):   35   Critical care time was exclusive of:  Separately billable procedures and treating other patients   Critical care was necessary to treat or prevent imminent or life-threatening deterioration of the following conditions:  Renal failure   Critical care was time spent personally by me on the following activities:  Development of treatment plan with patient or surrogate, discussions with consultants, evaluation of patient's response to treatment, examination of patient, obtaining history from patient or surrogate, ordering and performing treatments and interventions, ordering and review of laboratory studies, ordering and review of radiographic studies, pulse oximetry, re-evaluation of patient's condition and review of old charts ABDOMINAL PARACENTESIS Date/Time: 01/29/2018 5:07 PM Performed by: Merlyn Lot, MD Authorized by: Merlyn Lot, MD  Consent: Verbal consent obtained. Consent given by: patient Preparation: Patient was prepped and draped in the usual sterile fashion. Local anesthesia used: yes Anesthesia: local infiltration  Anesthesia: Local anesthesia used: yes Local Anesthetic: lidocaine 1% with epinephrine Anesthetic total: 10 mL  Sedation: Patient sedated: no  Comments: 60cc clear yellow fluid aspirated with US guided to LLQ.       Critical Care performed: yes ____________________________________________   INITIAL IMPRESSION / ASSESSMENT AND PLAN / ED COURSE  Pertinent labs & imaging results that were available during my care of the patient were reviewed by me and considered in my medical decision making (see chart for details).   DDX: renal fiaulre, electrolyte abn, dehydration, sbp, pna, influenza  Ch[place] Cawood is a 57 y.o. who presents to the ED with symptoms as described above.  Patient very pleasant today.  Does have trace edema and  inspiratory crackles with hypoxia at rest requiring supplemental oxygen.  Blood work from triage does show  evidence of acute hyperkalemia.  Will start temporization with IV calcium, insulin and bicarb and albuterol.  Patient complaining of odynophagia and does appear that he has some component of thrush.  Likely secondary to frequent inhalation treatments.  Will consult nephrology.  Patient also complaining of abdominal pain given his low-grade fever recent paracentesis will obtain diagnostic paracentesis.  Given his low-grade temperature symptoms will give dose of antibiotics this is being currently treated for healthcare associated pneumonia.  Clinical Course as of Jan 29 1742  Mon Jan 29, 2018  1737 Disgussed case with Dr. Juleen China.     [PR]  1739 Blood work shows stable leukocytosis.  There is comment that there is schistocytes present however he does not have any other signs of hemolysis.   [PR]    Clinical Course User Index [PR] Merlyn Lot, MD     As part of my medical decision making, I reviewed the following data within the [place] notes reviewed and incorporated, Labs reviewed, notes from prior ED visits.   ____________________________________________   FINAL CLINICAL IMPRESSION(S) / ED DIAGNOSES  Final diagnoses:  Acute hyperkalemia  Acute respiratory failure with hypoxia (HCC)      NEW MEDICATIONS STARTED DURING THIS VISIT:  New Prescriptions   No medications on file     Note:  This document was prepared using Dragon voice recognition software and may include unintentional dictation errors.    Merlyn Lot, MD 01/29/18 [phone]

## 2018-01-29 NOTE — ED Notes (Signed)
Dialysis RN at bedside.

## 2018-01-29 NOTE — Progress Notes (Signed)
[facility]  ROUNDING NOTE   Subjective:   [name] admitted to [facility] on 01/29/2018 for breathing difficulty  Found to have serum potassium of 7. Nephrology consulted for emergent hemodialysis.   Objective:  Vital signs in last 24 hours:  Temp:  [100 F (37.8 C)] 100 F (37.8 C) (11/18 1503) Pulse Rate:  [88-96] 88 (11/18 1830) Resp:  [17-24] 22 (11/18 1830) BP: (118-140)/(85-96) 118/88 (11/18 1830) SpO2:  [92 %-99 %] 92 % (11/18 1830) Weight:  [81.6 kg] 81.6 kg (11/18 1507)  Weight change:  Filed Weights   01/29/18 1507  Weight: 81.6 kg    Intake/Output: I/O last 3 completed shifts: In: 100 [IV Piggyback:100] Out: -    Intake/Output this shift:  No intake/output data recorded.  Physical Exam: General: NAD,   Head: Normocephalic, atraumatic. Moist oral mucosal membranes  Eyes: Anicteric, PERRL  Neck: Supple, trachea midline  Lungs:  +wheezing  Heart: Regular rate and rhythm  Abdomen:  + distended  Extremities:  peripheral edema.  Neurologic: Nonfocal, moving all four extremities  Skin: No lesions  Access: RIJ permcath    Basic Metabolic Panel: Recent Labs  Lab 01/29/18 1525  NA 139  K 7.0*  CL 103  CO2 25  GLUCOSE 101*  BUN 48*  CREATININE 8.72*  CALCIUM 8.6*    Liver Function Tests: Recent Labs  Lab 01/29/18 1525  AST 18  ALT 27  ALKPHOS 116  BILITOT 0.9  PROT 6.8  ALBUMIN 3.1*   No results for input(s): LIPASE, AMYLASE in the last 168 hours. No results for input(s): AMMONIA in the last 168 hours.  CBC: Recent Labs  Lab 01/29/18 1525  WBC 13.7*  NEUTROABS 11.4*  HGB 10.2*  HCT 36.4*  MCV 88.6  PLT 225    Cardiac Enzymes: No results for input(s): CKTOTAL, CKMB, CKMBINDEX, TROPONINI in the last 168 hours.  BNP: Invalid input(s): POCBNP  CBG: No results for input(s): GLUCAP in the last 168 hours.  Microbiology: Results for orders placed or performed during the hospital encounter of 01/15/18   MRSA PCR Screening     Status: Abnormal   Collection Time: 01/15/18  8:39 AM  Result Value Ref Range Status   MRSA by PCR POSITIVE (A) NEGATIVE Final    Comment:        The GeneXpert MRSA Assay (FDA approved for NASAL specimens only), is one component of a comprehensive MRSA colonization surveillance program. It is not intended to diagnose MRSA infection nor to guide or monitor treatment for MRSA infections. RESULT CALLED TO, READ BACK BY AND VERIFIED WITH: BETH BUONO 01/15/18 1905 REC Performed at [facility], [address] [place], [place] [address]   Culture, blood (Routine X 2) w Reflex to ID Panel     Status: None   Collection Time: 01/15/18 11:13 AM  Result Value Ref Range Status   Specimen Description BLOOD COLLECT BY DIALYSIS PORT  Final   Special Requests   Final    BOTTLES DRAWN AEROBIC AND ANAEROBIC Blood Culture results may not be optimal due to an excessive volume of blood received in culture bottles   Culture   Final    NO GROWTH 5 DAYS Performed at [facility], [address] [place], [place] [address]    Report Status 01/20/2018 FINAL  Final  Culture, blood (Routine X 2) w Reflex to ID Panel     Status: None   Collection Time: 01/15/18 11:14 AM  Result Value  Ref Range Status   Specimen Description BLOOD COLLECTED BY DIALYSIS PORT  Final   Special Requests   Final    BOTTLES DRAWN AEROBIC AND ANAEROBIC Blood Culture results may not be optimal due to an excessive volume of blood received in culture bottles   Culture   Final    NO GROWTH 5 DAYS Performed at [facility], [address], [place], [place] [address]    Report Status 01/20/2018 FINAL  Final    Coagulation Studies: Recent Labs    01/29/18 1525  LABPROT 13.9  INR 1.08    Urinalysis: No results for input(s): COLORURINE, LABSPEC, PHURINE, GLUCOSEU, HGBUR, BILIRUBINUR, KETONESUR, PROTEINUR, UROBILINOGEN, NITRITE, LEUKOCYTESUR in the last 72 hours.  Invalid  input(s): APPERANCEUR    Imaging: Dg Chest 2 View  Result Date: 01/29/2018 CLINICAL DATA:  Shortness of breath EXAM: CHEST - 2 VIEW COMPARISON:  01/16/2018 FINDINGS: Mild bilateral diffuse interstitial thickening. No pleural effusion or pneumothorax. No focal consolidation. Stable cardiomegaly. Dual lumen right-sided central venous catheter with the tip projecting over the SVC. Prior median sternotomy. No acute osseous abnormality. IMPRESSION: Cardiomegaly with mild pulmonary vascular congestion. Electronically Signed   By: Kathreen Devoid   On: 01/29/2018 15:40     Medications:   . [START ON 01/30/2018] ceFEPime (MAXIPIME) IV    . vancomycin 1,000 mg (01/29/18 1817)  . [START ON 01/30/2018] vancomycin     . [START ON 01/30/2018] Chlorhexidine Gluconate Cloth  6 each Topical Q0600  . nystatin  5 mL Oral QID     Assessment/ Plan:  [name] is a 57 y.o. black male   withend stage renal disease on hemodialysis secondary to Alport's syndrome, ascites, hypertension, anemia of chronic kidney disease, coronary artery disease, peripheral vascular disease, hyperlipidemia, gastrointestinal AVMs, pulmonary hypertension  [facility] TTS 80.5 kg RIJ permcath.   1.  End stage renal disease with hyperkalamia - Emergent hemodialysis tonight. Then resume TTS schedule. Orders prepared.  - 2 K bath  2. Ascites - ultrasound guided paracentesis during this admission  3. Hypertension  4. Anemia of chronic kidney disease - EPO with TTS treatments.    LOS: 0 Stanley [name] 11/18/20197:07 PM

## 2018-01-29 NOTE — ED Notes (Signed)
Pt informed of needing urine when able to provide sample

## 2018-01-29 NOTE — ED Notes (Signed)
Consent signed by patient and witnessed by myself Aldona Bar RN. Pt states MD Anselm Jungling reviewed reasons for emergent dialysis to be performed at this time.

## 2018-01-30 ENCOUNTER — Inpatient Hospital Stay (HOSPITAL_COMMUNITY): Payer: Medicare Other

## 2018-01-30 ENCOUNTER — Inpatient Hospital Stay: Payer: Medicare Other

## 2018-01-30 DIAGNOSIS — K7460 Unspecified cirrhosis of liver: Secondary | ICD-10-CM | POA: Diagnosis not present

## 2018-01-30 DIAGNOSIS — J9601 Acute respiratory failure with hypoxia: Secondary | ICD-10-CM | POA: Diagnosis not present

## 2018-01-30 DIAGNOSIS — R188 Other ascites: Secondary | ICD-10-CM | POA: Diagnosis not present

## 2018-01-30 DIAGNOSIS — N186 End stage renal disease: Secondary | ICD-10-CM | POA: Diagnosis not present

## 2018-01-30 DIAGNOSIS — D631 Anemia in chronic kidney disease: Secondary | ICD-10-CM | POA: Diagnosis not present

## 2018-01-30 DIAGNOSIS — Q8781 Alport syndrome: Secondary | ICD-10-CM | POA: Diagnosis not present

## 2018-01-30 DIAGNOSIS — J449 Chronic obstructive pulmonary disease, unspecified: Secondary | ICD-10-CM | POA: Diagnosis not present

## 2018-01-30 DIAGNOSIS — R509 Fever, unspecified: Secondary | ICD-10-CM | POA: Diagnosis not present

## 2018-01-30 DIAGNOSIS — I120 Hypertensive chronic kidney disease with stage 5 chronic kidney disease or end stage renal disease: Secondary | ICD-10-CM | POA: Diagnosis not present

## 2018-01-30 DIAGNOSIS — E875 Hyperkalemia: Secondary | ICD-10-CM | POA: Diagnosis not present

## 2018-01-30 LAB — PATHOLOGIST SMEAR REVIEW

## 2018-01-30 LAB — CBC
HCT: 32.6 % — ABNORMAL LOW (ref 39.0–52.0)
HEMATOCRIT: 32.5 % — AB (ref 39.0–52.0)
HEMOGLOBIN: 9.5 g/dL — AB (ref 13.0–17.0)
HEMOGLOBIN: 9.3 g/dL — AB (ref 13.0–17.0)
MCH: 25.3 pg — ABNORMAL LOW (ref 26.0–34.0)
MCH: 25.1 pg — AB (ref 26.0–34.0)
MCHC: 29.1 g/dL — ABNORMAL LOW (ref 30.0–36.0)
MCHC: 28.6 g/dL — ABNORMAL LOW (ref 30.0–36.0)
MCV: 86.9 fL (ref 80.0–100.0)
MCV: 87.8 fL (ref 80.0–100.0)
NRBC: 0 % (ref 0.0–0.2)
NRBC: 0 % (ref 0.0–0.2)
PLATELETS: 198 10*3/uL (ref 150–400)
Platelets: 194 10*3/uL (ref 150–400)
RBC: 3.75 MIL/uL — AB (ref 4.22–5.81)
RBC: 3.7 MIL/uL — ABNORMAL LOW (ref 4.22–5.81)
RDW: 21.2 % — ABNORMAL HIGH (ref 11.5–15.5)
RDW: 21.1 % — ABNORMAL HIGH (ref 11.5–15.5)
WBC: 10.4 10*3/uL (ref 4.0–10.5)
WBC: 9.8 10*3/uL (ref 4.0–10.5)

## 2018-01-30 LAB — BASIC METABOLIC PANEL
ANION GAP: 11 (ref 5–15)
BUN: 27 mg/dL — ABNORMAL HIGH (ref 6–20)
CALCIUM: 8.1 mg/dL — AB (ref 8.9–10.3)
CO2: 29 mmol/L (ref 22–32)
Chloride: 100 mmol/L (ref 98–111)
Creatinine, Ser: 5.54 mg/dL — ABNORMAL HIGH (ref 0.61–1.24)
GFR, EST AFRICAN AMERICAN: 12 mL/min — AB (ref >60)
GFR, EST NON AFRICAN AMERICAN: 10 mL/min — AB (ref >60)
Glucose, Bld: 150 mg/dL — ABNORMAL HIGH (ref 70–99)
POTASSIUM: 4.5 mmol/L (ref 3.5–5.1)
Sodium: 140 mmol/L (ref 135–145)

## 2018-01-30 LAB — GRAM STAIN

## 2018-01-30 MED ORDER — TIOTROPIUM BROMIDE MONOHYDRATE 18 MCG IN CAPS
18.0000 ug | ORAL_CAPSULE | Freq: Every day | RESPIRATORY_TRACT | Status: DC
  Filled 2018-01-30: qty 5

## 2018-01-30 MED ORDER — MOMETASONE FURO-FORMOTEROL FUM 200-5 MCG/ACT IN AERO
2.0000 | INHALATION_SPRAY | Freq: Two times a day (BID) | RESPIRATORY_TRACT | Status: DC
  Administered 2018-01-30: 2 via RESPIRATORY_TRACT
  Filled 2018-01-30: qty 8.8

## 2018-01-30 MED ORDER — TIOTROPIUM BROMIDE MONOHYDRATE 18 MCG IN CAPS: 18.0000 ug | ORAL_CAPSULE | Freq: Every day | RESPIRATORY_TRACT | 2 refills | Status: DC

## 2018-01-30 MED ORDER — EPOETIN ALFA 10000 UNIT/ML IJ SOLN
10000.0000 [IU] | INTRAMUSCULAR | Status: DC
  Administered 2018-01-30: 10000 [IU] via INTRAVENOUS

## 2018-01-30 MED ORDER — CALCIUM ACETATE (PHOS BINDER) 667 MG PO CAPS
2001.0000 mg | ORAL_CAPSULE | Freq: Three times a day (TID) | ORAL | Status: DC
  Administered 2018-01-30: 2001 mg via ORAL
  Filled 2018-01-30: qty 3

## 2018-01-30 MED ORDER — DIPHENHYDRAMINE HCL 50 MG/ML IJ SOLN
12.5000 mg | Freq: Once | INTRAMUSCULAR | Status: AC
  Administered 2018-01-30: 12.5 mg via INTRAVENOUS
  Filled 2018-01-30: qty 1

## 2018-01-30 MED ORDER — PANTOPRAZOLE SODIUM 40 MG PO TBEC
40.0000 mg | DELAYED_RELEASE_TABLET | Freq: Every day | ORAL | Status: DC
  Administered 2018-01-30: 40 mg via ORAL
  Filled 2018-01-30: qty 1

## 2018-01-30 MED ORDER — OXYCODONE-ACETAMINOPHEN 5-325 MG PO TABS
1.0000 | ORAL_TABLET | Freq: Four times a day (QID) | ORAL | Status: DC | PRN
  Administered 2018-01-30 (×2): 1 via ORAL
  Filled 2018-01-30 (×2): qty 1

## 2018-01-30 MED ORDER — FOLIC ACID 1 MG PO TABS
1.0000 mg | ORAL_TABLET | Freq: Every day | ORAL | Status: DC
  Administered 2018-01-30: 1 mg via ORAL
  Filled 2018-01-30: qty 1

## 2018-01-30 MED ORDER — RENA-VITE PO TABS
1.0000 | ORAL_TABLET | Freq: Once | ORAL | Status: AC
  Administered 2018-01-30: 1 via ORAL
  Filled 2018-01-30: qty 1

## 2018-01-30 MED ORDER — IPRATROPIUM-ALBUTEROL 0.5-2.5 (3) MG/3ML IN SOLN: 3.0000 mL | Freq: Four times a day (QID) | RESPIRATORY_TRACT | Status: DC | PRN

## 2018-01-30 MED ORDER — HEPARIN SODIUM (PORCINE) 5000 UNIT/ML IJ SOLN
5000.0000 [IU] | Freq: Three times a day (TID) | INTRAMUSCULAR | Status: DC
  Administered 2018-01-30 (×2): 5000 [IU] via SUBCUTANEOUS
  Filled 2018-01-30 (×2): qty 1

## 2018-01-30 MED ORDER — DOCUSATE SODIUM 100 MG PO CAPS: 100.0000 mg | ORAL_CAPSULE | Freq: Two times a day (BID) | ORAL | Status: DC | PRN

## 2018-01-30 MED ORDER — GABAPENTIN 300 MG PO CAPS
300.0000 mg | ORAL_CAPSULE | Freq: Three times a day (TID) | ORAL | Status: DC
  Administered 2018-01-30 (×2): 300 mg via ORAL
  Filled 2018-01-30 (×2): qty 1

## 2018-01-30 MED ORDER — MORPHINE SULFATE (PF) 2 MG/ML IV SOLN: 1.0000 mg | INTRAVENOUS | Status: DC | PRN

## 2018-01-30 MED ORDER — RENA-VITE PO TABS: 1.0000 | ORAL_TABLET | Freq: Every day | ORAL | Status: DC

## 2018-01-30 NOTE — ED Notes (Signed)
Report called to 2nd floor nurse

## 2018-01-30 NOTE — ED Notes (Signed)
HD Treatment Complete    01/30/18 0049  Vital Signs  Pulse Rate 87  Oxygen Therapy  SpO2 92 %  During Hemodialysis Assessment  Blood Flow Rate (mL/min) 400 mL/min  Arterial Pressure (mmHg) -180 mmHg  Venous Pressure (mmHg) 150 mmHg  Transmembrane Pressure (mmHg) 60 mmHg  Ultrafiltration Rate (mL/min) 930 mL/min  Dialysate Flow Rate (mL/min) 600 ml/min  Conductivity: Machine  14.1  Intra-Hemodialysis Comments Tolerated well;Tx completed (UF 2658)  Hemodialysis Catheter Right Internal jugular Double-lumen  No Placement Date or Time found.   Placed prior to admission: Yes  Orientation: Right  Access Location: Internal jugular  Hemodialysis Catheter Type: Double-lumen  Site Condition No complications  Blue Lumen Status Heparin locked  Red Lumen Status Heparin locked  Purple Lumen Status N/A  Catheter fill solution Heparin 1000 units/ml  Catheter fill volume (Arterial) 1.5 cc  Catheter fill volume (Venous) 1.5  Dressing Type Biopatch;Occlusive  Dressing Status Clean;Dry;Intact  Interventions New dressing  Drainage Description None  Dressing Change Due 02/06/18  Post treatment catheter status Capped and Clamped

## 2018-01-30 NOTE — ED Notes (Signed)
Post HD Treatment  Pt tolerated treatment well. His net UF was 2158 mL and his BVP was 65.1. All blood was returned to patient. He has no complaints at this time. Report given to primary ED Nurse.    01/30/18 0101  Hand-Off documentation  Report given to (Full Name) Primary ED Nurse  Vital Signs  Temp 99.2 F (37.3 C)  Temp Source Oral  Pulse Rate 87  Pulse Rate Source Monitor  Resp 16  BP 123/72  BP Location Left Arm  BP Method Automatic  Patient Position (if appropriate) Lying  Oxygen Therapy  SpO2 90 %  O2 Device Room Air  Pain Assessment  Pain Scale 0-10  Pain Score 0  Dialysis Weight  Weight  (Unable to weigh)  Type of Weight Post-Dialysis  Post-Hemodialysis Assessment  Rinseback Volume (mL) 250 mL  KECN 277 V  Dialyzer Clearance Lightly streaked  Duration of HD Treatment -hour(s) 3 hour(s)  Hemodialysis Intake (mL) 500 mL  UF Total -Machine (mL) 2658 mL  Net UF (mL) 2158 mL  Tolerated HD Treatment Yes  Post-Hemodialysis Comments Pt tolerated treatment well

## 2018-01-30 NOTE — Progress Notes (Signed)
Pre HD assessment    01/30/18 0951  Neurological  Level of Consciousness Alert  Orientation Level Oriented X4  Respiratory  Respiratory Pattern Accessory muscle use;Dyspnea at rest  Chest Assessment Chest expansion symmetrical  Cardiac  ECG Monitor Yes  Cardiac Rhythm NSR  Vascular  R Radial Pulse +2  L Radial Pulse +2  Edema Generalized;Other (Comment) (third spacing, abdomen)  Integumentary  Integumentary (WDL) X  Skin Color Appropriate for ethnicity  Musculoskeletal  Musculoskeletal (WDL) X  Generalized Weakness Yes  Assistive Device None  GU Assessment  Genitourinary (WDL) X  Genitourinary Symptoms  (HD)  Psychosocial  Psychosocial (WDL) WDL  Patient Behaviors Cooperative;Calm  Needs Expressed Physical  Emotional support given Given to patient

## 2018-01-30 NOTE — Progress Notes (Signed)
Post HD assessment    01/30/18 1338  Neurological  Level of Consciousness Alert  Orientation Level Oriented X4  Respiratory  Respiratory Pattern Regular;Unlabored  Chest Assessment Chest expansion symmetrical  Cardiac  ECG Monitor Yes  Cardiac Rhythm NSR  Ectopy Unifocal PVC's  Ectopy Frequency Occasional  Vascular  R Radial Pulse +2  L Radial Pulse +2  Edema Generalized (third spacing abdomen )  Integumentary  Integumentary (WDL) X  Skin Color Appropriate for ethnicity  Musculoskeletal  Musculoskeletal (WDL) X  Generalized Weakness Yes  Assistive Device None  GU Assessment  Genitourinary (WDL) X  Genitourinary Symptoms  (HD)  Psychosocial  Psychosocial (WDL) WDL  Emotional support given Given to patient

## 2018-01-30 NOTE — Progress Notes (Signed)
Patient cleared for discharge.        Education complete. AVS printed. Discharge instructions given. All questions answered for patient clarification.  Prescriptions given, pharmacy verified.  IV removed.  Discharged to home Via POV          

## 2018-01-30 NOTE — Progress Notes (Signed)
HD tx start    01/30/18 0956  Vital Signs  Pulse Rate 82  Pulse Rate Source Monitor  Resp 20  BP 108/69  BP Location Left Arm  BP Method Automatic  Patient Position (if appropriate) Lying  Oxygen Therapy  SpO2 95 %  O2 Device Room Air  During Hemodialysis Assessment  Blood Flow Rate (mL/min) 400 mL/min  Arterial Pressure (mmHg) -120 mmHg  Venous Pressure (mmHg) 120 mmHg  Transmembrane Pressure (mmHg) 60 mmHg  Ultrafiltration Rate (mL/min) 710 mL/min  Dialysate Flow Rate (mL/min) 600 ml/min  Conductivity: Machine  13.9  HD Safety Checks Performed Yes  Dialysis Fluid Bolus Normal Saline  Bolus Amount (mL) 250 mL  Intra-Hemodialysis Comments Tx initiated  Hemodialysis Catheter Right Internal jugular Double-lumen  No Placement Date or Time found.   Placed prior to admission: Yes  Orientation: Right  Access Location: Internal jugular  Hemodialysis Catheter Type: Double-lumen  Blue Lumen Status Infusing  Red Lumen Status Infusing

## 2018-01-30 NOTE — Progress Notes (Signed)
Pre HD assessment   01/30/18 0950  Vital Signs  Temp 98.7 F (37.1 C)  Temp Source Oral  Pulse Rate 82  Pulse Rate Source Monitor  Resp 17  BP 116/72  BP Location Left Arm  BP Method Automatic  Patient Position (if appropriate) Lying  Oxygen Therapy  SpO2 95 %  O2 Device Room Air  Pain Assessment  Pain Scale 0-10  Pain Score 9  Pain Type Chronic pain  Pain Location Foot  Pain Orientation Left  Pain Onset On-going  Pain Intervention(s) RN made aware  Dialysis Weight  Weight 81.9 kg  Type of Weight Pre-Dialysis  Time-Out for Hemodialysis  What Procedure? HD  Pt Identifiers(min of two) First/Last Name;MRN/Account#  Correct Site? Yes  Correct Side? Yes  Correct Procedure? Yes  Consents Verified? Yes  Rad Studies Available? N/A  Safety Precautions Reviewed? Yes  Engineer, civil (consulting) Number  (4A)  Station Number 4  UF/Alarm Test Passed  Conductivity: Meter 13.8  Conductivity: Machine  14  pH 7.6  Reverse Osmosis main  Normal Saline Lot Number [id number]  Dialyzer Lot Number [id number]  Disposable Set Lot Number [id number]  Machine Temperature 98.6 F (37 C)  Musician and Audible Yes  Blood Lines Intact and Secured Yes  Pre Treatment Patient Checks  Vascular access used during treatment Catheter  Hepatitis B Surface Antigen Results Negative  Date Hepatitis B Surface Antigen Drawn 06/16/17  Hepatitis B Surface Antibody 50.3  Date Hepatitis B Surface Antibody Drawn 06/16/17  Hemodialysis Consent Verified Yes  Hemodialysis Standing Orders Initiated Yes  ECG (Telemetry) Monitor On Yes  Prime Ordered Normal Saline  Length of  DialysisTreatment -hour(s) 3.5 Hour(s)  Dialyzer Elisio 17H NR  Dialysate 2K, 2.5 Ca  Dialysis Anticoagulant None  Dialysate Flow Ordered 600  Blood Flow Rate Ordered 400 mL/min  Ultrafiltration Goal 2 Liters  Pre Treatment Labs CBC  Dialysis Blood Pressure Support Ordered Normal Saline  Education / Care Plan  Dialysis Education  Provided Yes  Documented Education in Care Plan Yes  Hemodialysis Catheter Right Internal jugular Double-lumen  No Placement Date or Time found.   Placed prior to admission: Yes  Orientation: Right  Access Location: Internal jugular  Hemodialysis Catheter Type: Double-lumen  Site Condition No complications  Blue Lumen Status Heparin locked  Red Lumen Status Heparin locked  Purple Lumen Status N/A  Dressing Type Biopatch  Dressing Status Clean;Dry;Intact  Drainage Description None

## 2018-01-30 NOTE — ED Notes (Signed)
Dialysis complete   Pt sleeping.

## 2018-01-30 NOTE — Care Management (Signed)
Amanda Morris dialysis liaison notified of admission.    

## 2018-01-30 NOTE — Care Management Obs Status (Signed)
[facility] NOTIFICATION   Patient Details  Name: Sanjit Mcmichael MRN: [id number] Date of Birth: 02/02/1961   Medicare Observation Status Notification Given:  No(Patient discharged prior to being notified that patient is code 17)    Beverly Sessions, RN 01/30/2018, 5:10 PM

## 2018-01-30 NOTE — Progress Notes (Signed)
HD tx end    01/30/18 1326  Vital Signs  Pulse Rate 83  Pulse Rate Source Monitor  Resp 13  BP 119/72  BP Location Left Arm  BP Method Automatic  Patient Position (if appropriate) Lying  Oxygen Therapy  SpO2 94 %  O2 Device Room Air  During Hemodialysis Assessment  Dialysis Fluid Bolus Normal Saline  Bolus Amount (mL) 250 mL  Intra-Hemodialysis Comments Tx completed

## 2018-01-30 NOTE — Progress Notes (Signed)
Post HD assessment. Pt tolerated tx well without c/o or complication. Net UF 2014, goal met.    01/30/18 1339  Vital Signs  Temp 98.6 F (37 C)  Temp Source Oral  Pulse Rate 81  Pulse Rate Source Monitor  Resp 15  BP 128/76  BP Location Left Arm  BP Method Automatic  Patient Position (if appropriate) Lying  Oxygen Therapy  SpO2 93 %  O2 Device Room Air  Dialysis Weight  Weight 80.2 kg  Type of Weight Post-Dialysis  Post-Hemodialysis Assessment  Rinseback Volume (mL) 250 mL  KECN 79.9 V  Dialyzer Clearance Lightly streaked  Duration of HD Treatment -hour(s) 3.5 hour(s)  Hemodialysis Intake (mL) 500 mL  UF Total -Machine (mL) 2514 mL  Net UF (mL) 2014 mL  Tolerated HD Treatment Yes  Education / Care Plan  Dialysis Education Provided Yes  Documented Education in Care Plan Yes  Hemodialysis Catheter Right Internal jugular Double-lumen  No Placement Date or Time found.   Placed prior to admission: Yes  Orientation: Right  Access Location: Internal jugular  Hemodialysis Catheter Type: Double-lumen  Site Condition No complications  Blue Lumen Status Heparin locked  Red Lumen Status Heparin locked  Purple Lumen Status N/A  Catheter fill solution Heparin 1000 units/ml  Catheter fill volume (Arterial) 1.5 cc  Catheter fill volume (Venous) 1.5  Dressing Type Biopatch  Dressing Status Clean;Dry;Intact  Drainage Description None  Post treatment catheter status Capped and Clamped

## 2018-01-30 NOTE — Discharge Summary (Signed)
[facility] NAME: Stanley [name]    MR#:  [id number]  [place] OF BIRTH:  1960-05-26  DATE OF ADMISSION:  01/29/2018   ADMITTING PHYSICIAN: Vaughan Basta, MD  DATE OF DISCHARGE: 01/30/2018 PRIMARY CARE PHYSICIAN: [name], MD   ADMISSION DIAGNOSIS:  Acute hyperkalemia [E87.5] Acute respiratory failure with hypoxia ([place]) [J96.01] DISCHARGE DIAGNOSIS:  Active Problems:   Hyperkalemia  SECONDARY DIAGNOSIS:   Past Medical History:  Diagnosis Date  . Alcohol abuse   . CHF (congestive heart failure) ([place])   . Cirrhosis ([place])   . Coronary artery disease 2009  . Drug abuse ([place])   . End stage renal disease on dialysis [facility]) NEPHROLOGIST-   DR [facility]  IN [place]   HEMODIALYSIS --   TUES/  THURS/  SAT  . Gastrointestinal bleed 06/13/2017   From chart...hx of multiple GI bleeds  . GERD (gastroesophageal reflux disease)   . Hyperlipidemia   . Hypertension   . PAD (peripheral artery disease) ([place])   . Renal insufficiency    Per pt, 32 oz fluid restriction per day  . S/P triple vessel bypass 06/09/2016   2009ish  . Suicidal ideation    & HOMICIDAL IDEATION --  06-16-2013   ADMITTED TO [place] COURSE:  *Hyperkalemia Improved with urgent hemodialysis.   *No healthcare associated pneumonia,  This is diagnosis suspected by ER physician due to patient's low-grade fever and sore throat with cough.  Influenza test is negative. Chest x-ray shows some pulmonary edema. Patient was recently treated in hospital and then by oral doxycycline course for the same diagnosis.  Finished antibiotics few days ago. Currently his vitals are stable and does not appear sepsis.  Antibiotics is discontinued.  *Liver cirrhosis and ascites The patient has recurrent paracentesis.  Paracentesis was done just now.  *End-stage renal disease on The patient got hemodialysis yesterday and today, continue  scheduled hemodialysis after discharge.  *COPD, stable, continue home medication.  I discussed with Dr. Juleen China. DISCHARGE CONDITIONS:  Stable, discharge to home today. CONSULTS OBTAINED:  Treatment Team:  Lavonia Dana, MD DRUG ALLERGIES:  No Known Allergies DISCHARGE MEDICATIONS:   Allergies as of 01/30/2018   No Known Allergies     Medication List    STOP taking these medications   predniSONE 10 MG (21) Tbpk tablet Commonly known as:  STERAPRED UNI-PAK 21 TAB     TAKE these medications   budesonide-formoterol 160-4.5 MCG/ACT inhaler Commonly known as:  SYMBICORT Inhale 2 puffs into the lungs daily.   calcium acetate 667 MG capsule Commonly known as:  PHOSLO Take 2,001 mg by mouth 3 (three) times daily.   folic acid 1 MG tablet Commonly known as:  FOLVITE Take 1 tablet (1 mg total) by mouth daily.   gabapentin 300 MG capsule Commonly known as:  NEURONTIN Take 300 mg by mouth 3 (three) times daily.   ipratropium-albuterol 0.5-2.5 (3) MG/3ML Soln Commonly known as:  DUONEB Take 3 mLs by nebulization every 6 (six) hours as needed. What changed:  reasons to take this   multivitamin Tabs tablet Take 1 tablet by mouth at bedtime.   mupirocin ointment 2 % Commonly known as:  BACTROBAN Place 1 application into the nose 2 (two) times daily.   omeprazole 40 MG capsule Commonly known as:  PRILOSEC Take 40 mg by mouth daily.   Spacer/Aero Chamber Mouthpiece Misc 1 Units by Does not apply route every 4 (four) hours  as needed (wheezing).   tiotropium 18 MCG inhalation capsule Commonly known as:  SPIRIVA Place 1 capsule (18 mcg total) into inhaler and inhale daily.        DISCHARGE INSTRUCTIONS:  See AVS.  If you experience worsening of your admission symptoms, develop shortness of breath, life threatening emergency, suicidal or homicidal thoughts you must seek medical attention immediately by calling 911 or calling your MD immediately  if symptoms less  severe.  You Must read complete instructions/literature along with all the possible adverse reactions/side effects for all the Medicines you take and that have been prescribed to you. Take any new Medicines after you have completely understood and accpet all the possible adverse reactions/side effects.   Please note  You were cared for by a hospitalist during your hospital stay. If you have any questions about your discharge medications or the care you received while you were in the hospital after you are discharged, you can call the unit and asked to speak with the hospitalist on call if the hospitalist that took care of you is not available. Once you are discharged, your primary care physician will handle any further medical issues. Please note that NO REFILLS for any discharge medications will be authorized once you are discharged, as it is imperative that you return to your primary care physician (or establish a relationship with a primary care physician if you do not have one) for your aftercare needs so that they can reassess your need for medications and monitor your lab values.    On the day of Discharge:  VITAL SIGNS:  Blood pressure 125/73, pulse 87, temperature 98.6 F (37 C), temperature source Oral, resp. rate 15, height 6\' 3"  (1.905 m), weight 80.2 kg, SpO2 95 %. PHYSICAL EXAMINATION:  GENERAL:  57 y.o.-year-old patient lying in the bed with no acute distress.  EYES: Pupils equal, round, reactive to light and accommodation. No scleral icterus. Extraocular muscles intact.  HEENT: Head atraumatic, normocephalic. Oropharynx and nasopharynx clear.  NECK:  Supple, no jugular venous distention. No thyroid enlargement, no tenderness.  LUNGS: Normal breath sounds bilaterally, no wheezing, rales,rhonchi or crepitation. No use of accessory muscles of respiration.  CARDIOVASCULAR: S1, S2 normal. No murmurs, rubs, or gallops.  ABDOMEN: Soft, non-tender, distended. Bowel sounds present. No  organomegaly or mass.  EXTREMITIES: No pedal edema, cyanosis, or clubbing.  NEUROLOGIC: Cranial nerves II through XII are intact. Muscle strength 5/5 in all extremities. Sensation intact. Gait not checked.  PSYCHIATRIC: The patient is alert and oriented x 3.  SKIN: No obvious rash, lesion, or ulcer.  DATA REVIEW:   CBC Recent Labs  Lab 01/30/18 0932  WBC 9.8  HGB 9.3*  HCT 32.5*  PLT 194    Chemistries  Recent Labs  Lab 01/29/18 1525 01/30/18 0201  NA 139 140  K 7.0* 4.5  CL 103 100  CO2 25 29  GLUCOSE 101* 150*  BUN 48* 27*  CREATININE 8.72* 5.54*  CALCIUM 8.6* 8.1*  AST 18  --   ALT 27  --   ALKPHOS 116  --   BILITOT 0.9  --      Microbiology Results  Results for orders placed or performed during the hospital encounter of 01/29/18  Culture, blood (Routine x 2)     Status: None (Preliminary result)   Collection Time: 01/29/18  3:25 PM  Result Value Ref Range Status   Specimen Description BLOOD BLOOD LEFT FOREARM  Final   Special Requests   Final  BOTTLES DRAWN AEROBIC AND ANAEROBIC Blood Culture results may not be optimal due to an inadequate volume of blood received in culture bottles   Culture   Final    NO GROWTH < 24 HOURS Performed at [facility], [address] [place], [place] [address]    Report Status PENDING  Incomplete  Culture, body fluid-bottle     Status: None (Preliminary result)   Collection Time: 01/29/18  5:05 PM  Result Value Ref Range Status   Specimen Description PERITONEAL  Final   Special Requests NONE  Final   Culture   Final    NO GROWTH < 24 HOURS Performed at [facility], [place] [address], [place], [place] [address]    Report Status PENDING  Incomplete  Gram stain     Status: None   Collection Time: 01/29/18  5:05 PM  Result Value Ref Range Status   Specimen Description PERITONEAL  Final   Special Requests NONE  Final   Gram Stain   Final    WBC PRESENT, PREDOMINANTLY MONONUCLEAR NO ORGANISMS  SEEN CYTOSPIN SMEAR Performed at [facility], [place] [address], [place], [place] [address]    Report Status 01/30/2018 FINAL  Final  Culture, blood (Routine x 2)     Status: None (Preliminary result)   Collection Time: 01/30/18  2:01 AM  Result Value Ref Range Status   Specimen Description BLOOD LEFT HAND  Final   Special Requests   Final    BOTTLES DRAWN AEROBIC AND ANAEROBIC Blood Culture adequate volume   Culture   Final    NO GROWTH < 12 HOURS Performed at [facility], [address], [place], [place] [address]    Report Status PENDING  Incomplete    RADIOLOGY:  Ct Chest Wo Contrast  Result Date: 01/29/2018 CLINICAL DATA:  Difficulty breathing.  Swelling and face. EXAM: CT CHEST WITHOUT CONTRAST TECHNIQUE: Multidetector CT imaging of the chest was performed following the standard protocol without IV contrast. COMPARISON:  Chest x-ray January 29, 2018. Chest CT June 03, 2017. FINDINGS: Cardiovascular: Cardiomegaly. Coronary artery calcifications. Atherosclerotic changes are seen in the nonaneurysmal thoracic aorta. The main pulmonary artery measures 4 cm on series 2, image 67. Mediastinum/Nodes: The thyroid is normal. A central line terminates in the central SVC. A prominent node anterior to the right-sided trachea on series 2, image 57 measures 14 mm. A few other mildly prominent and enlarged nodes are noted in the mediastinum. The esophagus is unremarkable. No effusions. Lungs/Pleura: Central airways are normal. No pneumothorax. Emphysematous changes are seen in the lungs. No overt edema. No masses or suspicious infiltrates. A few small scattered pulmonary nodules are noted. A representative nodule in the left upper lobe measures 5 mm. Several other similar appearing small nodules are seen in the lungs bilaterally. Upper Abdomen: A nodular liver is consistent with cirrhosis. Ascites is identified in the upper abdomen. The kidneys are relatively atrophic. Musculoskeletal:  No chest wall mass or suspicious bone lesions identified. IMPRESSION: 1. The liver demonstrates a mildly nodular contour suggesting cirrhosis. There is ascites in the upper abdomen adjacent to the liver and spleen. 2. Emphysematous changes in the lungs. 3. Scattered pulmonary nodules with the largest on the left measuring 5 mm. No follow-up needed if patient is low-risk. Non-contrast chest CT can be considered in 12 months if patient is high-risk. This recommendation follows the consensus statement: Guidelines for Management of Incidental Pulmonary Nodules Detected on CT Images: From the [place]  2017; Radiology 2017; 800:349-179. 4. Coronary artery calcifications. Atherosclerotic changes in the nonaneurysmal aorta. 5. Cardiomegaly. 6. Dilatation of the main pulmonary artery to 4 cm raising the possibility of pulmonary arterial hypertension. 7. Prominent mildly enlarged nodes in the mediastinum may be reactive. Recommend attention on follow-up. Aortic Atherosclerosis (ICD10-I70.0) and Emphysema (ICD10-J43.9). Electronically Signed   By: Dorise Bullion III M.D   On: 01/29/2018 19:24     Management plans discussed with the patient, family and they are in agreement.  CODE STATUS: Full Code   TOTAL TIME TAKING CARE OF THIS PATIENT: 33 minutes.    Demetrios Loll M.D on 01/30/2018 at 4:38 PM  Between 7am to 6pm - Pager - [phone]  After 6pm go to [email or web address] - Proofreader  [facility]  Office  [phone]  CC: Primary care physician; [name], MD   Note: This dictation was prepared with Dragon dictation along with smaller phrase technology. Any transcriptional errors that result from this process are unintentional.

## 2018-01-30 NOTE — Progress Notes (Signed)
[facility]  ROUNDING NOTE   Subjective:   Emergent hemodialysis treatment last night for hyperkalemia and hypervolemia.   Patient is requesting a paracentesis  Seen and examined on regularly scheduled hemodialysis treatment.     HEMODIALYSIS FLOWSHEET:  Blood Flow Rate (mL/min): 400 mL/min Arterial Pressure (mmHg): -170 mmHg Venous Pressure (mmHg): 130 mmHg Transmembrane Pressure (mmHg): 50 mmHg Ultrafiltration Rate (mL/min): 710 mL/min Dialysate Flow Rate (mL/min): 600 ml/min Conductivity: Machine : 14.5 Conductivity: Machine : 14.5 Dialysis Fluid Bolus: Normal Saline Bolus Amount (mL): 250 mL    Objective:  Vital signs in last 24 hours:  Temp:  [98.3 F (36.8 C)-100 F (37.8 C)] 98.7 F (37.1 C) (11/19 0950) Pulse Rate:  [78-102] 83 (11/19 1230) Resp:  [14-24] 24 (11/19 1230) BP: (108-146)/(60-96) 115/82 (11/19 1230) SpO2:  [89 %-99 %] 96 % (11/19 1230) Weight:  [81.6 kg-81.9 kg] 81.9 kg (11/19 0950)  Weight change:  Filed Weights   01/29/18 1507 01/30/18 0950  Weight: 81.6 kg 81.9 kg    Intake/Output: I/O last 3 completed shifts: In: 540 [P.O.:240; IV Piggyback:300] Out: 2158 [Other:2158]   Intake/Output this shift:  No intake/output data recorded.  Physical Exam: General: NAD,   Head: Normocephalic, atraumatic. Moist oral mucosal membranes  Eyes: Anicteric, PERRL  Neck: Supple, trachea midline  Lungs:  clear  Heart: Regular rate and rhythm  Abdomen:  + distended  Extremities:  peripheral edema.  Neurologic: Nonfocal, moving all four extremities  Skin: No lesions  Access: RIJ permcath    Basic Metabolic Panel: Recent Labs  Lab 01/29/18 1525 01/30/18 0201  NA 139 140  K 7.0* 4.5  CL 103 100  CO2 25 29  GLUCOSE 101* 150*  BUN 48* 27*  CREATININE 8.72* 5.54*  CALCIUM 8.6* 8.1*    Liver Function Tests: Recent Labs  Lab 01/29/18 1525  AST 18  ALT 27  ALKPHOS 116  BILITOT 0.9  PROT 6.8  ALBUMIN 3.1*   No results  for input(s): LIPASE, AMYLASE in the last 168 hours. No results for input(s): AMMONIA in the last 168 hours.  CBC: Recent Labs  Lab 01/29/18 1525 01/30/18 0201 01/30/18 0932  WBC 13.7* 10.4 9.8  NEUTROABS 11.4*  --   --   HGB 10.2* 9.5* 9.3*  HCT 36.4* 32.6* 32.5*  MCV 88.6 86.9 87.8  PLT 225 198 194    Cardiac Enzymes: No results for input(s): CKTOTAL, CKMB, CKMBINDEX, TROPONINI in the last 168 hours.  BNP: Invalid input(s): POCBNP  CBG: No results for input(s): GLUCAP in the last 168 hours.  Microbiology: Results for orders placed or performed during the hospital encounter of 01/29/18  Culture, blood (Routine x 2)     Status: None (Preliminary result)   Collection Time: 01/29/18  3:25 PM  Result Value Ref Range Status   Specimen Description BLOOD BLOOD LEFT FOREARM  Final   Special Requests   Final    BOTTLES DRAWN AEROBIC AND ANAEROBIC Blood Culture results may not be optimal due to an inadequate volume of blood received in culture bottles   Culture   Final    NO GROWTH < 24 HOURS Performed at [facility], [address], [place], [place] [address]    Report Status PENDING  Incomplete  Gram stain     Status: None   Collection Time: 01/29/18  5:05 PM  Result Value Ref Range Status   Specimen Description PERITONEAL  Final   Special Requests NONE  Final   Gram Stain  Final    WBC PRESENT, PREDOMINANTLY MONONUCLEAR NO ORGANISMS SEEN CYTOSPIN SMEAR Performed at [facility], [place] [address] [place], [place] [address]    Report Status 01/30/2018 FINAL  Final  Culture, blood (Routine x 2)     Status: None (Preliminary result)   Collection Time: 01/30/18  2:01 AM  Result Value Ref Range Status   Specimen Description BLOOD LEFT HAND  Final   Special Requests   Final    BOTTLES DRAWN AEROBIC AND ANAEROBIC Blood Culture adequate volume   Culture   Final    NO GROWTH < 12 HOURS Performed at [facility], [address] [place],  [place] [address]    Report Status PENDING  Incomplete    Coagulation Studies: Recent Labs    01/29/18 1525  LABPROT 13.9  INR 1.08    Urinalysis: No results for input(s): COLORURINE, LABSPEC, PHURINE, GLUCOSEU, HGBUR, BILIRUBINUR, KETONESUR, PROTEINUR, UROBILINOGEN, NITRITE, LEUKOCYTESUR in the last 72 hours.  Invalid input(s): APPERANCEUR    Imaging: Dg Chest 2 View  Result Date: 01/29/2018 CLINICAL DATA:  Shortness of breath EXAM: CHEST - 2 VIEW COMPARISON:  01/16/2018 FINDINGS: Mild bilateral diffuse interstitial thickening. No pleural effusion or pneumothorax. No focal consolidation. Stable cardiomegaly. Dual lumen right-sided central venous catheter with the tip projecting over the SVC. Prior median sternotomy. No acute osseous abnormality. IMPRESSION: Cardiomegaly with mild pulmonary vascular congestion. Electronically Signed   By: Kathreen Devoid   On: 01/29/2018 15:40   Ct Chest Wo Contrast  Result Date: 01/29/2018 CLINICAL DATA:  Difficulty breathing.  Swelling and face. EXAM: CT CHEST WITHOUT CONTRAST TECHNIQUE: Multidetector CT imaging of the chest was performed following the standard protocol without IV contrast. COMPARISON:  Chest x-ray January 29, 2018. Chest CT June 03, 2017. FINDINGS: Cardiovascular: Cardiomegaly. Coronary artery calcifications. Atherosclerotic changes are seen in the nonaneurysmal thoracic aorta. The main pulmonary artery measures 4 cm on series 2, image 67. Mediastinum/Nodes: The thyroid is normal. A central line terminates in the central SVC. A prominent node anterior to the right-sided trachea on series 2, image 57 measures 14 mm. A few other mildly prominent and enlarged nodes are noted in the mediastinum. The esophagus is unremarkable. No effusions. Lungs/Pleura: Central airways are normal. No pneumothorax. Emphysematous changes are seen in the lungs. No overt edema. No masses or suspicious infiltrates. A few small scattered pulmonary nodules are noted. A  representative nodule in the left upper lobe measures 5 mm. Several other similar appearing small nodules are seen in the lungs bilaterally. Upper Abdomen: A nodular liver is consistent with cirrhosis. Ascites is identified in the upper abdomen. The kidneys are relatively atrophic. Musculoskeletal: No chest wall mass or suspicious bone lesions identified. IMPRESSION: 1. The liver demonstrates a mildly nodular contour suggesting cirrhosis. There is ascites in the upper abdomen adjacent to the liver and spleen. 2. Emphysematous changes in the lungs. 3. Scattered pulmonary nodules with the largest on the left measuring 5 mm. No follow-up needed if patient is low-risk. Non-contrast chest CT can be considered in 12 months if patient is high-risk. This recommendation follows the consensus statement: Guidelines for Management of Incidental Pulmonary Nodules Detected on CT Images: From the [employer] 2017; Radiology 2017; 284:228-243. 4. Coronary artery calcifications. Atherosclerotic changes in the nonaneurysmal aorta. 5. Cardiomegaly. 6. Dilatation of the main pulmonary artery to 4 cm raising the possibility of pulmonary arterial hypertension. 7. Prominent mildly enlarged nodes in the mediastinum may be reactive. Recommend attention on follow-up. Aortic Atherosclerosis (  ICD10-I70.0) and Emphysema (ICD10-J43.9). Electronically Signed   By: Dorise Bullion III M.D   On: 01/29/2018 19:24     Medications:    . calcium acetate  2,001 mg Oral TID WC  . Chlorhexidine Gluconate Cloth  6 each Topical Q0600  . epoetin (EPOGEN/PROCRIT) injection  10,000 Units Intravenous Q T,Th,Sa-HD  . folic acid  1 mg Oral Daily  . gabapentin  300 mg Oral TID  . heparin  5,000 Units Subcutaneous Q8H  . mometasone-formoterol  2 puff Inhalation BID  . multivitamin  1 tablet Oral QHS  . nystatin  5 mL Oral QID  . pantoprazole  40 mg Oral Daily  . tiotropium  18 mcg Inhalation Daily     Assessment/ Plan:  Stanley [name] is a 57 y.o. black male   [place] stage renal disease on hemodialysis secondary to Alport's syndrome, ascites, hypertension, anemia of chronic kidney disease, coronary artery disease, peripheral vascular disease, hyperlipidemia, gastrointestinal AVMs, pulmonary hypertension  [facility] TTS 80.5 kg RIJ permcath.   1.  End stage renal disease with hyperkalemia on admission requiring emergent hemodialysis last night Seen and examined on hemodialysis treatment.  Continue TTS schedule - 2 K bath  2. Ascites - ultrasound guided paracentesis last on 11/6.  - scheduled for today.   3. Hypertension: at goal. Currently not on any blood pressure agents.   4. Anemia of chronic kidney disease - EPO with TTS treatments.    LOS: 1 Lillis Nuttle 11/19/201912:46 PM

## 2018-01-30 NOTE — ED Notes (Signed)
Post HD Assessment    01/30/18 0049  Neurological  Level of Consciousness Alert  Orientation Level Oriented X4  Respiratory  Respiratory Pattern Regular;Unlabored;Symmetrical  Chest Assessment Chest expansion symmetrical  Bilateral Breath Sounds Diminished  Cardiac  Pulse Regular  Heart Sounds S1, S2  Jugular Venous Distention (JVD) No  ECG Monitor Yes  Cardiac Rhythm NSR  Antiarrhythmic device No  Vascular  R Radial Pulse +2  L Radial Pulse +2  R Dorsalis Pedis Pulse +1  L Dorsalis Pedis Pulse +1  Edema Generalized  Generalized Edema +2  RLE Edema Non-pitting  LLE Edema Non-Pitting  Integumentary  Integumentary (WDL) X  Skin Color Appropriate for ethnicity  Skin Condition Dry  Skin Integrity Catheter entry/exit site;Surgical Incision (see LDA)  Musculoskeletal  Musculoskeletal (WDL) X  Generalized Weakness Yes  GU Assessment  Genitourinary (WDL) X (HD pt)  Genitourinary Symptoms Oliguria  Psychosocial  Psychosocial (WDL) X  Patient Behaviors Agitated;Irritable  Needs Expressed Physical  Emotional support given Given to patient

## 2018-02-03 DIAGNOSIS — N2581 Secondary hyperparathyroidism of renal origin: Secondary | ICD-10-CM | POA: Diagnosis not present

## 2018-02-03 DIAGNOSIS — Z992 Dependence on renal dialysis: Secondary | ICD-10-CM | POA: Diagnosis not present

## 2018-02-03 DIAGNOSIS — D509 Iron deficiency anemia, unspecified: Secondary | ICD-10-CM | POA: Diagnosis not present

## 2018-02-03 DIAGNOSIS — N186 End stage renal disease: Secondary | ICD-10-CM | POA: Diagnosis not present

## 2018-02-03 DIAGNOSIS — D631 Anemia in chronic kidney disease: Secondary | ICD-10-CM | POA: Diagnosis not present

## 2018-02-03 LAB — CULTURE, BLOOD (ROUTINE X 2): CULTURE: NO GROWTH

## 2018-02-03 LAB — CULTURE, BODY FLUID-BOTTLE: CULTURE: NO GROWTH

## 2018-02-03 LAB — CULTURE, BODY FLUID W GRAM STAIN -BOTTLE

## 2018-02-04 LAB — CULTURE, BLOOD (ROUTINE X 2)
CULTURE: NO GROWTH
SPECIAL REQUESTS: ADEQUATE

## 2018-02-06 DIAGNOSIS — N186 End stage renal disease: Secondary | ICD-10-CM | POA: Diagnosis not present

## 2018-02-06 DIAGNOSIS — D509 Iron deficiency anemia, unspecified: Secondary | ICD-10-CM | POA: Diagnosis not present

## 2018-02-06 DIAGNOSIS — N2581 Secondary hyperparathyroidism of renal origin: Secondary | ICD-10-CM | POA: Diagnosis not present

## 2018-02-06 DIAGNOSIS — Z992 Dependence on renal dialysis: Secondary | ICD-10-CM | POA: Diagnosis not present

## 2018-02-06 DIAGNOSIS — D631 Anemia in chronic kidney disease: Secondary | ICD-10-CM | POA: Diagnosis not present

## 2018-02-10 DIAGNOSIS — Z992 Dependence on renal dialysis: Secondary | ICD-10-CM | POA: Diagnosis not present

## 2018-02-10 DIAGNOSIS — N186 End stage renal disease: Secondary | ICD-10-CM | POA: Diagnosis not present

## 2018-02-10 DIAGNOSIS — N2581 Secondary hyperparathyroidism of renal origin: Secondary | ICD-10-CM | POA: Diagnosis not present

## 2018-02-10 DIAGNOSIS — D631 Anemia in chronic kidney disease: Secondary | ICD-10-CM | POA: Diagnosis not present

## 2018-02-10 DIAGNOSIS — D509 Iron deficiency anemia, unspecified: Secondary | ICD-10-CM | POA: Diagnosis not present

## 2018-02-13 IMAGING — CT CT ANGIO CHEST
3 of 7 series · 18 of 36 positions shown · IV contrast (APPLIED)
Comparison: Current chest radiograph.

CLINICAL DATA: Per note in pt chart "Pt states back pain from upper
to lower on both sides that started at 11am. Denies ever having back
pain in the past. History of dialysis t,r,sat and had dialysis
yesterday. Had an episode of vomiting in the ems."

EXAM:
CT ANGIOGRAPHY CHEST WITH CONTRAST
TECHNIQUE: Multidetector CT imaging of the chest was performed using the
standard protocol during bolus administration of intravenous
contrast. Multiplanar CT image reconstructions and MIPs were
obtained to evaluate the vascular anatomy.
CONTRAST:  75 mL of Isovue 370 intravenous contrast

[Series 5: axial arterial · axial · arterial · 0.72mm/px · z∈[-347,-50]mm · 11 of 119 slices shown]
[im 10/119  lung]
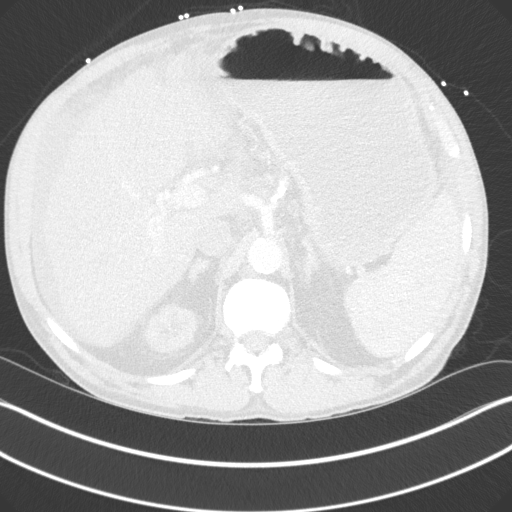
[im 20/119  mediastinal]
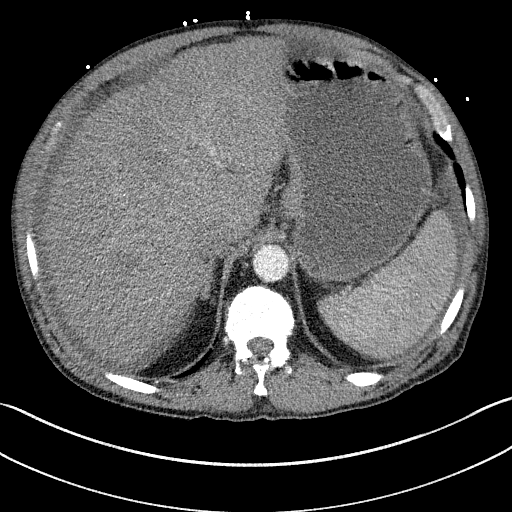
[im 30/119  lung]
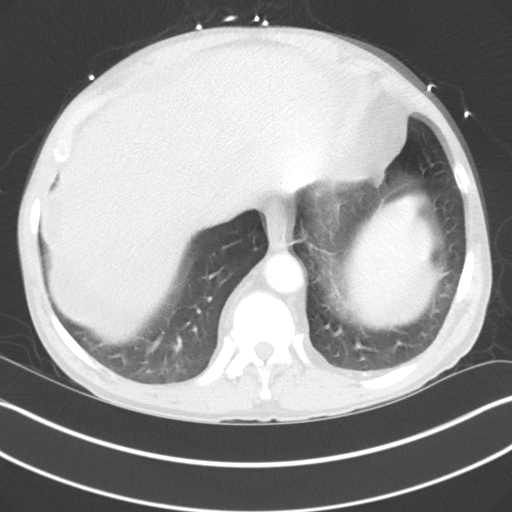
[im 40/119  mediastinal]
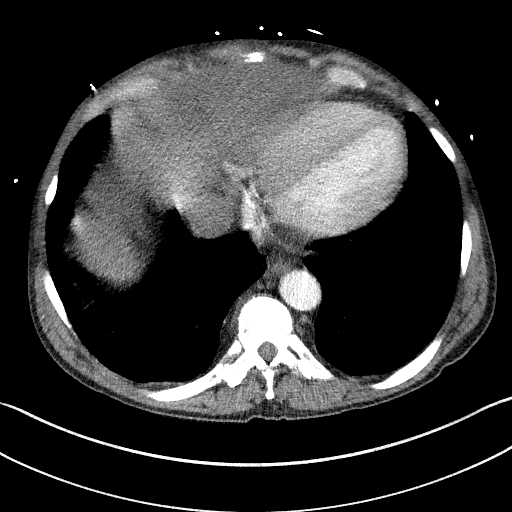
[im 50/119  lung]
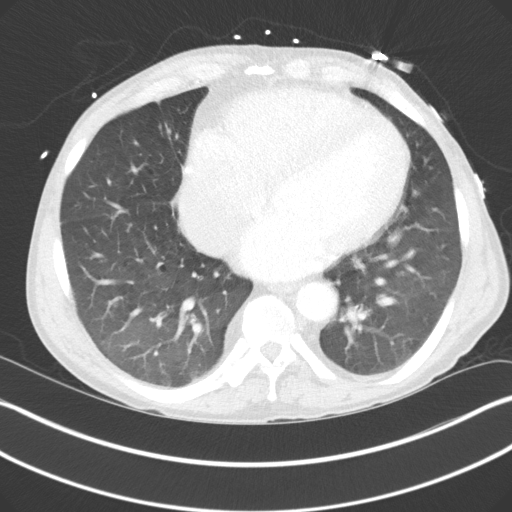
[im 60/119  mediastinal]
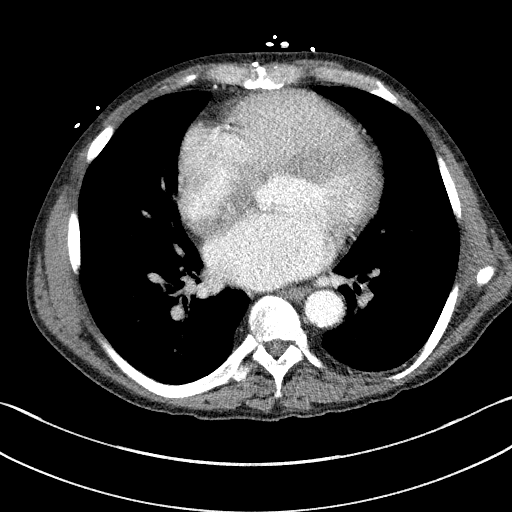
[im 69/119  lung]
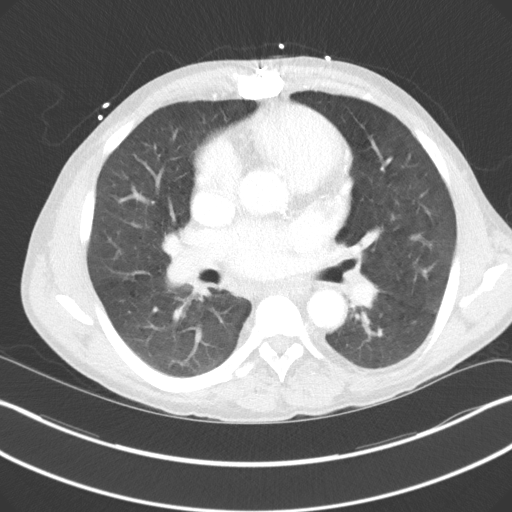
[im 79/119  mediastinal]
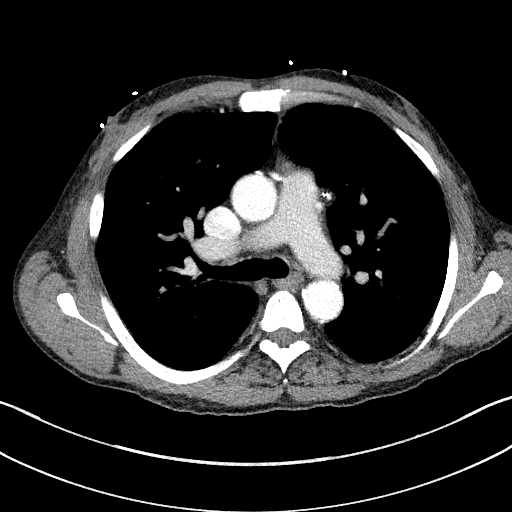
[im 89/119  lung]
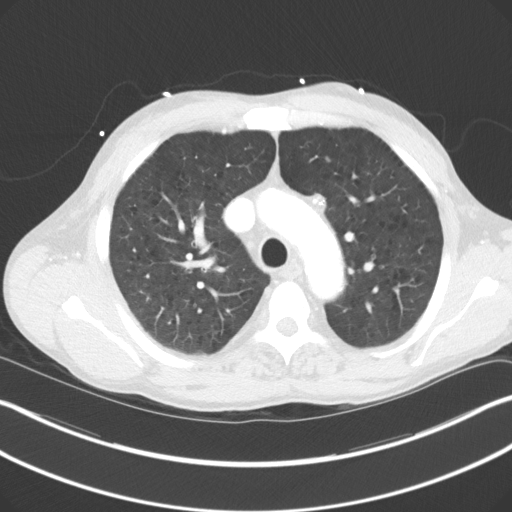
[im 99/119  mediastinal]
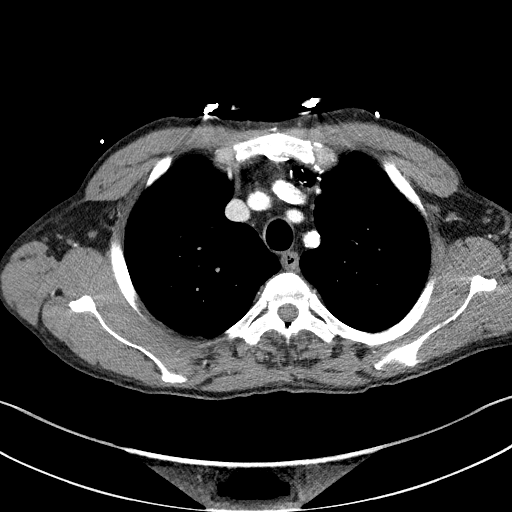
[im 109/119  lung]
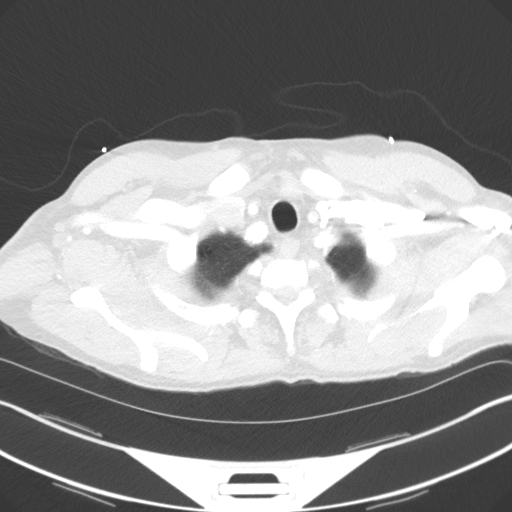

[Series 6: lung · axial · 0.72mm/px · z∈[-325,-75]mm · 6 of 72 slices shown]
[im 11/72  mediastinal]
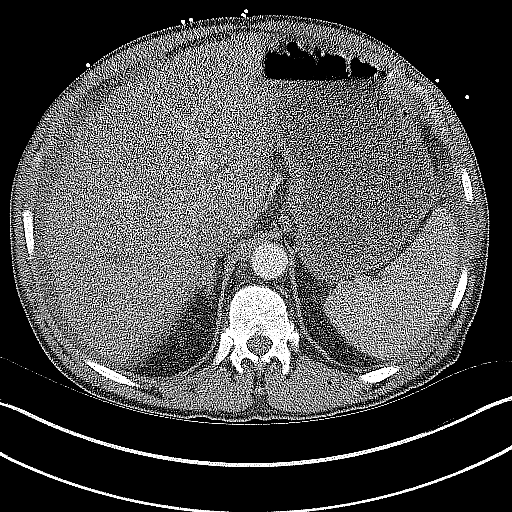
[im 21/72  mediastinal]
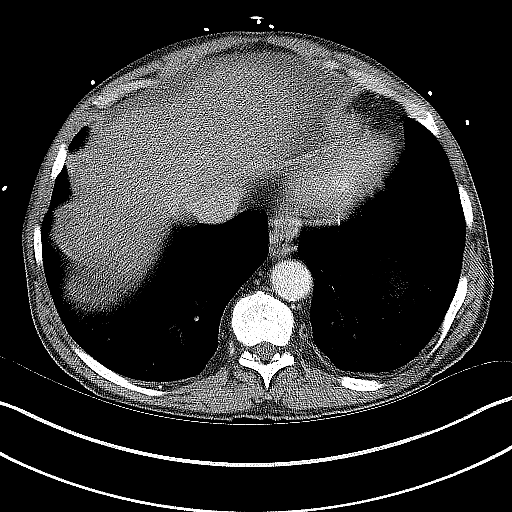
[im 31/72  mediastinal]
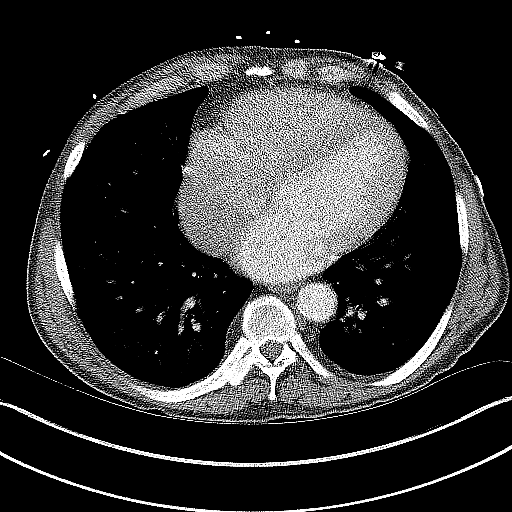
[im 41/72  mediastinal]
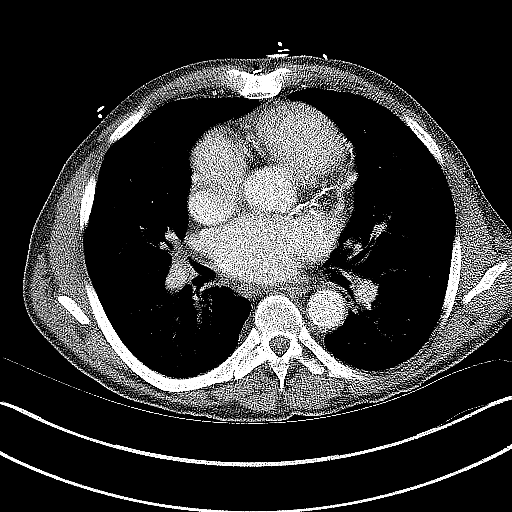
[im 51/72  mediastinal]
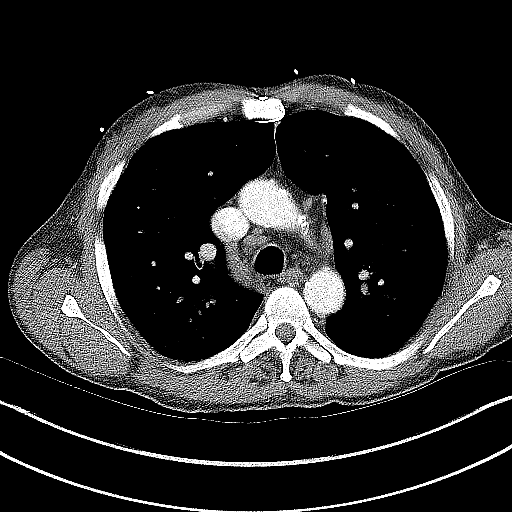
[im 61/72  mediastinal]
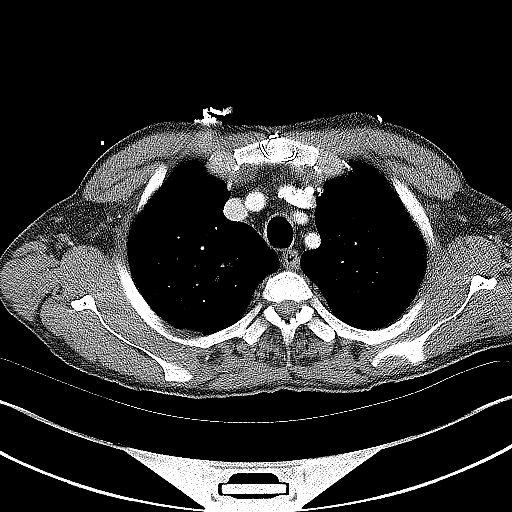

[Series 8: coronals · coronal · 0.73mm/px · 1 of 151 slices shown]
[im 76/151  mediastinal]
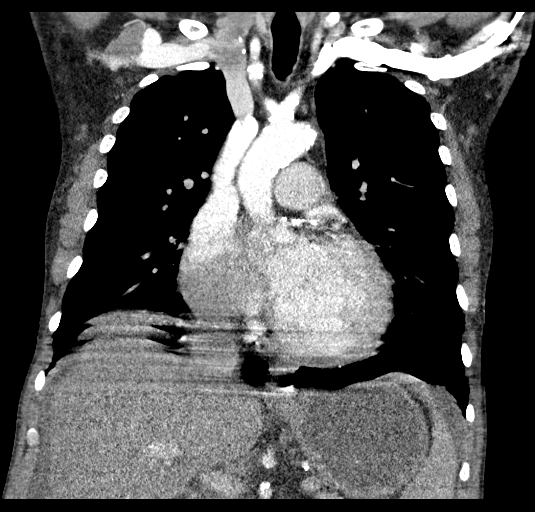

[18 of 36 positions shown; findings below may reference images not displayed]

FINDINGS: Cardiovascular: Contrast timing heels the greatest enhancement of
the aorta. Aorta is normal in caliber. There are atherosclerotic
calcifications along the thoracic aorta and at the origin of the
branch vessels. No aneurysm. There is insufficient opacification of
the pulmonary arteries to exclude pulmonary emboli. There is no
evidence of a large central pulmonary embolus.

Heart is mildly enlarged. There are dense coronary artery
calcifications and changes from prior CABG surgery. No pericardial
effusion.

Mediastinum/Nodes: No neck base or axillary masses or adenopathy.
There are multiple mildly prominent mediastinal lymph nodes. A right
peritracheal, azygos level node measures 11 mm in short axis. A
right subcarinal node measures 2 cm in short axis. No mediastinal or
hilar masses. No hilar adenopathy.

Lungs/Pleura: Moderate centrilobular emphysema. No evidence of
pneumonia or pulmonary edema. No mass or suspicious nodule. No
pleural effusion or pneumothorax.

Upper Abdomen: Small amount ascites. Diffuse renal cortical thinning
noted of the visualized kidneys.

Musculoskeletal: No fracture or acute finding. No osteoblastic or
osteolytic lesions.

Review of the MIP images confirms the above findings.
IMPRESSION: 1. No evidence of a thoracic aortic dissection or aneurysm.
2. No acute findings.  No evidence of pneumonia or pulmonary edema.
3. Mild cardiomegaly and dense coronary artery calcifications with
changes from prior CABG surgery.
4. Mild mediastinal adenopathy, presumed reactive.
5. Small amount ascites, likely related to renal failure.

## 2018-02-13 IMAGING — CR DG CHEST 2V
2 series · 2 of 2 positions shown · non-contrast
Comparison: February 01, 2016

CLINICAL DATA: Mid back pain.  Productive cough for 1 hour.

EXAM:
CHEST  2 VIEW

[chest pa]
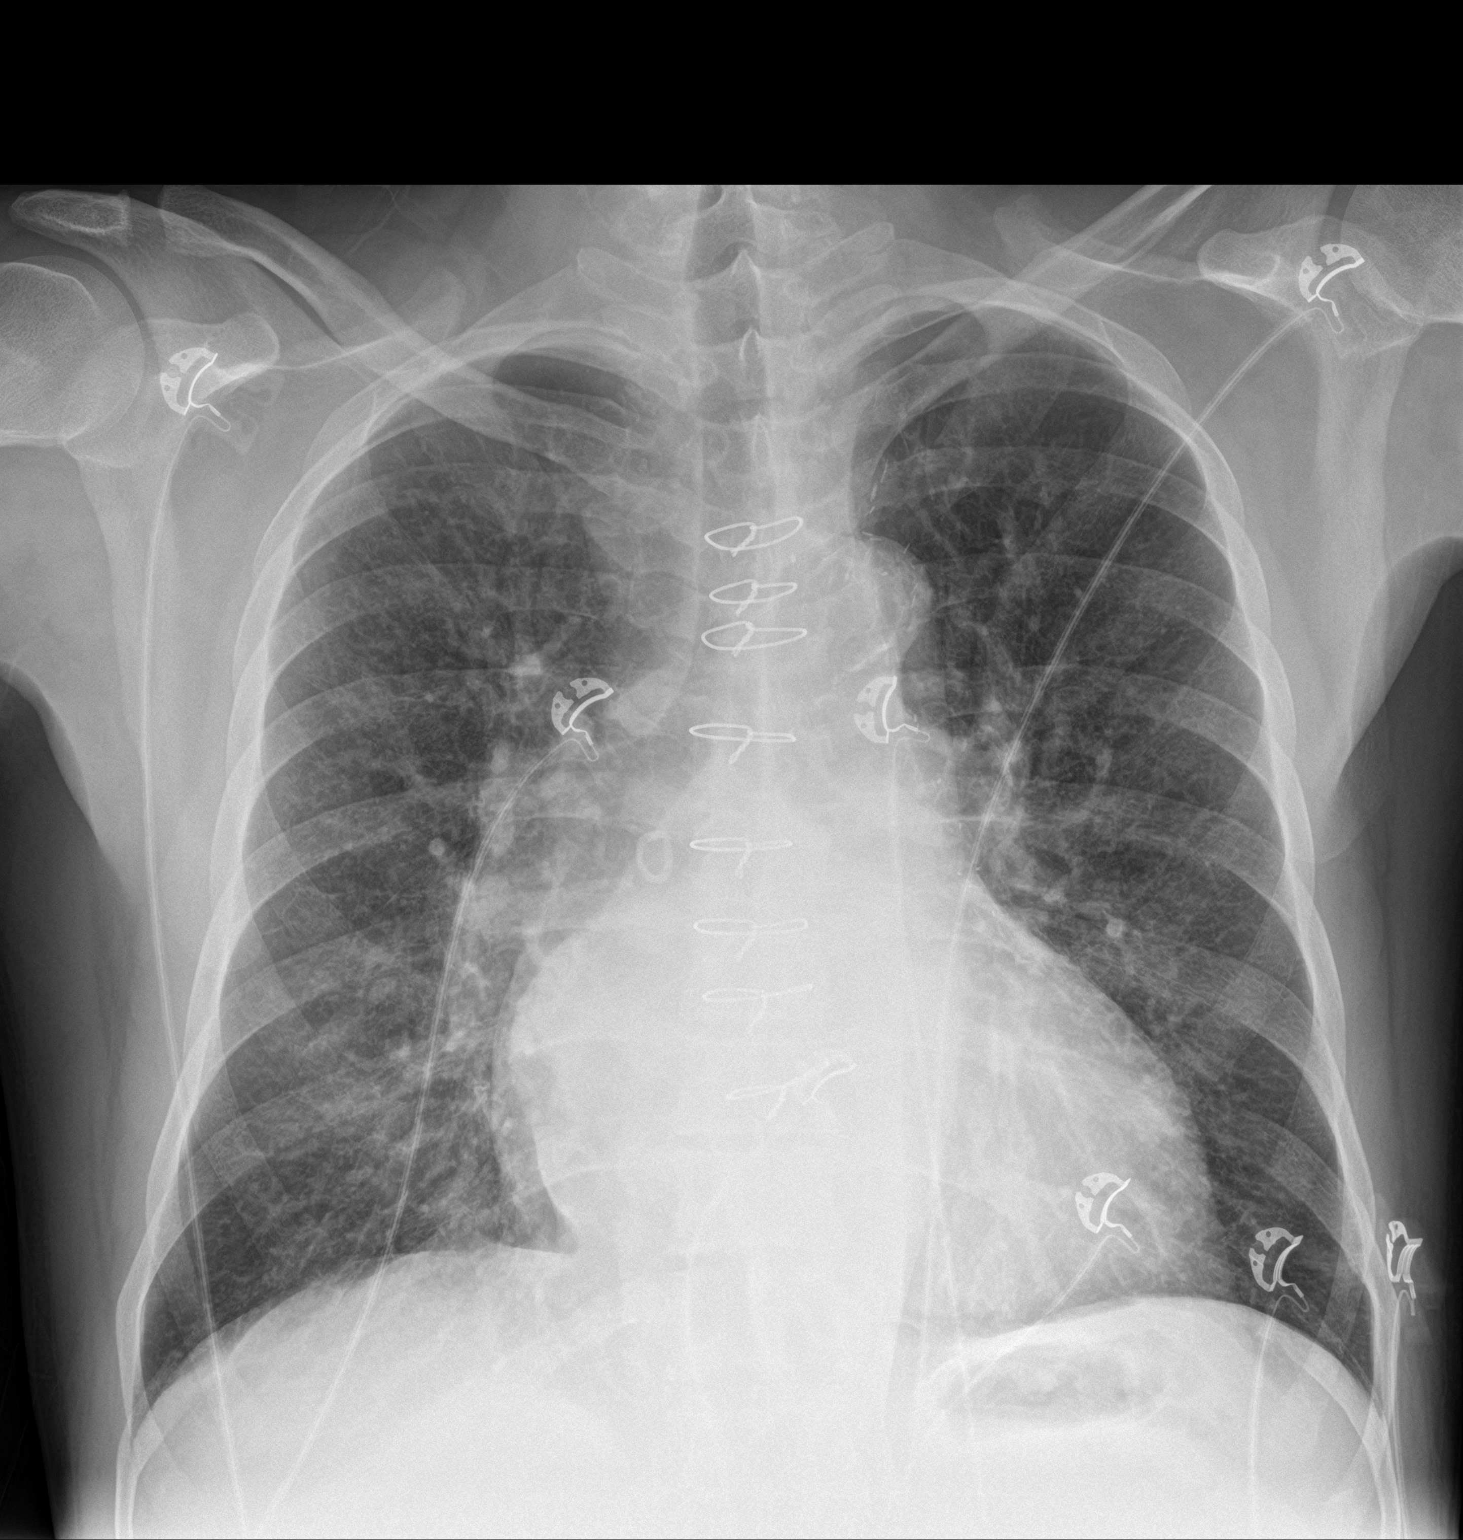

[chest lat]
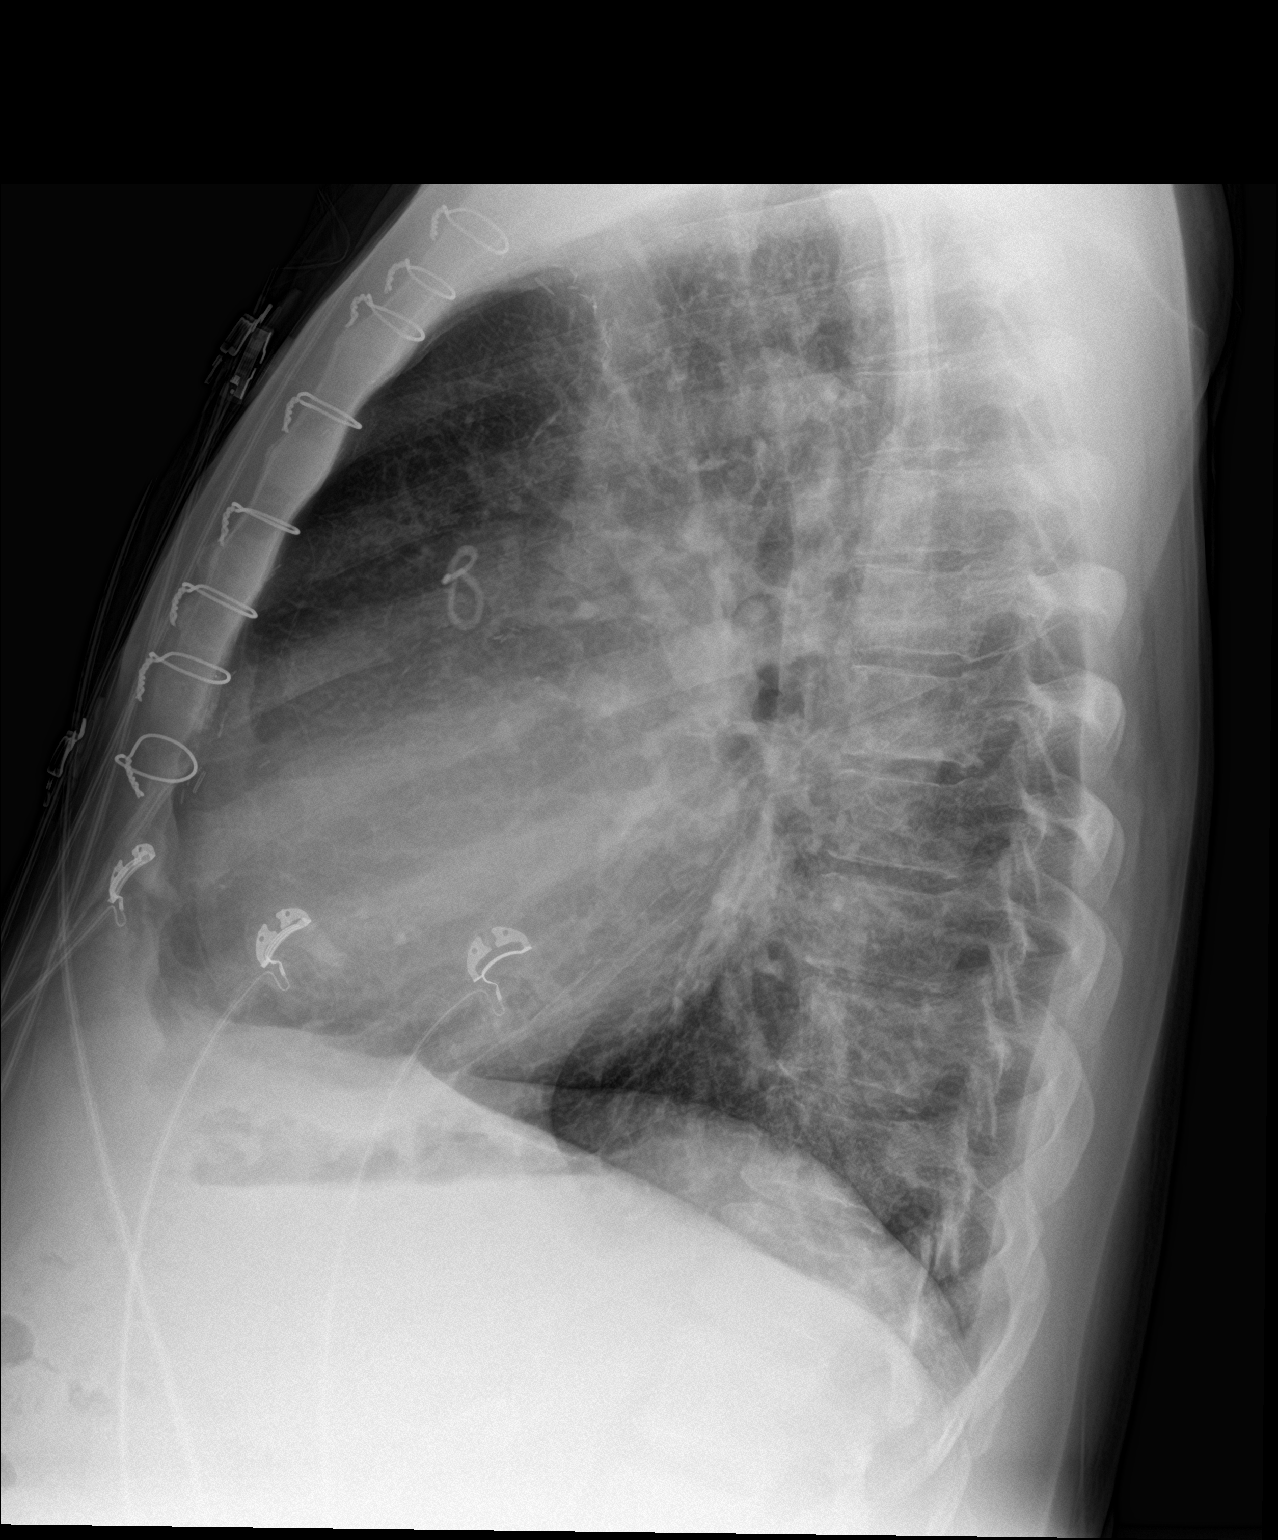

[2 of 2 positions shown; findings below may reference images not displayed]

FINDINGS: No pneumothorax. Increased interstitial markings are similar in the
interval consistent with mild edema. Stable cardiomegaly. The hila
and mediastinum are unchanged. No acute interval changes.
IMPRESSION: Cardiomegaly and mild edema.

## 2018-02-15 DIAGNOSIS — D509 Iron deficiency anemia, unspecified: Secondary | ICD-10-CM | POA: Diagnosis not present

## 2018-02-15 DIAGNOSIS — T782XXA Anaphylactic shock, unspecified, initial encounter: Secondary | ICD-10-CM | POA: Diagnosis not present

## 2018-02-15 DIAGNOSIS — D631 Anemia in chronic kidney disease: Secondary | ICD-10-CM | POA: Diagnosis not present

## 2018-02-15 DIAGNOSIS — Z992 Dependence on renal dialysis: Secondary | ICD-10-CM | POA: Diagnosis not present

## 2018-02-15 DIAGNOSIS — N2581 Secondary hyperparathyroidism of renal origin: Secondary | ICD-10-CM | POA: Diagnosis not present

## 2018-02-15 DIAGNOSIS — N186 End stage renal disease: Secondary | ICD-10-CM | POA: Diagnosis not present

## 2018-02-19 ENCOUNTER — Emergency Department: Payer: Medicare Other

## 2018-02-19 ENCOUNTER — Other Ambulatory Visit: Payer: Self-pay

## 2018-02-19 DIAGNOSIS — Z992 Dependence on renal dialysis: Secondary | ICD-10-CM | POA: Insufficient documentation

## 2018-02-19 DIAGNOSIS — R000 Tachycardia, unspecified: Secondary | ICD-10-CM | POA: Diagnosis not present

## 2018-02-19 DIAGNOSIS — D649 Anemia, unspecified: Secondary | ICD-10-CM | POA: Insufficient documentation

## 2018-02-19 DIAGNOSIS — F17210 Nicotine dependence, cigarettes, uncomplicated: Secondary | ICD-10-CM | POA: Diagnosis not present

## 2018-02-19 DIAGNOSIS — R188 Other ascites: Secondary | ICD-10-CM | POA: Diagnosis not present

## 2018-02-19 DIAGNOSIS — K7460 Unspecified cirrhosis of liver: Secondary | ICD-10-CM | POA: Insufficient documentation

## 2018-02-19 DIAGNOSIS — J189 Pneumonia, unspecified organism: Secondary | ICD-10-CM | POA: Diagnosis not present

## 2018-02-19 DIAGNOSIS — I132 Hypertensive heart and chronic kidney disease with heart failure and with stage 5 chronic kidney disease, or end stage renal disease: Secondary | ICD-10-CM | POA: Insufficient documentation

## 2018-02-19 DIAGNOSIS — I509 Heart failure, unspecified: Secondary | ICD-10-CM | POA: Insufficient documentation

## 2018-02-19 DIAGNOSIS — I120 Hypertensive chronic kidney disease with stage 5 chronic kidney disease or end stage renal disease: Secondary | ICD-10-CM | POA: Diagnosis not present

## 2018-02-19 DIAGNOSIS — J441 Chronic obstructive pulmonary disease with (acute) exacerbation: Secondary | ICD-10-CM | POA: Insufficient documentation

## 2018-02-19 DIAGNOSIS — Z79899 Other long term (current) drug therapy: Secondary | ICD-10-CM | POA: Diagnosis not present

## 2018-02-19 DIAGNOSIS — E875 Hyperkalemia: Secondary | ICD-10-CM | POA: Diagnosis not present

## 2018-02-19 DIAGNOSIS — R0602 Shortness of breath: Secondary | ICD-10-CM | POA: Diagnosis not present

## 2018-02-19 DIAGNOSIS — N186 End stage renal disease: Secondary | ICD-10-CM | POA: Diagnosis not present

## 2018-02-19 DIAGNOSIS — R0902 Hypoxemia: Secondary | ICD-10-CM | POA: Diagnosis not present

## 2018-02-19 DIAGNOSIS — R0689 Other abnormalities of breathing: Secondary | ICD-10-CM | POA: Diagnosis not present

## 2018-02-19 LAB — COMPREHENSIVE METABOLIC PANEL
ALK PHOS: 103 U/L (ref 38–126)
ALT: 14 U/L (ref 0–44)
AST: 21 U/L (ref 15–41)
Albumin: 3.1 g/dL — ABNORMAL LOW (ref 3.5–5.0)
Anion gap: 13 (ref 5–15)
BUN: 56 mg/dL — ABNORMAL HIGH (ref 6–20)
CALCIUM: 8.2 mg/dL — AB (ref 8.9–10.3)
CO2: 23 mmol/L (ref 22–32)
Chloride: 103 mmol/L (ref 98–111)
Creatinine, Ser: 14.25 mg/dL — ABNORMAL HIGH (ref 0.61–1.24)
GFR calc Af Amer: 4 mL/min — ABNORMAL LOW (ref >60)
GFR calc non Af Amer: 3 mL/min — ABNORMAL LOW (ref >60)
Glucose, Bld: 130 mg/dL — ABNORMAL HIGH (ref 70–99)
Potassium: 6.3 mmol/L (ref 3.5–5.1)
SODIUM: 139 mmol/L (ref 135–145)
Total Bilirubin: 0.6 mg/dL (ref 0.3–1.2)
Total Protein: 7 g/dL (ref 6.5–8.1)

## 2018-02-19 LAB — TROPONIN I: Troponin I: 0.07 ng/mL (ref <0.03)

## 2018-02-19 LAB — CBC WITH DIFFERENTIAL/PLATELET
Abs Immature Granulocytes: 0.07 10*3/uL (ref 0.00–0.07)
Basophils Absolute: 0 10*3/uL (ref 0.0–0.1)
Basophils Relative: 0 %
Eosinophils Absolute: 0.1 10*3/uL (ref 0.0–0.5)
Eosinophils Relative: 1 %
HCT: 35.7 % — ABNORMAL LOW (ref 39.0–52.0)
Hemoglobin: 10.1 g/dL — ABNORMAL LOW (ref 13.0–17.0)
Immature Granulocytes: 1 %
Lymphocytes Relative: 9 %
Lymphs Abs: 0.8 10*3/uL (ref 0.7–4.0)
MCH: 24.9 pg — ABNORMAL LOW (ref 26.0–34.0)
MCHC: 28.3 g/dL — ABNORMAL LOW (ref 30.0–36.0)
MCV: 88.1 fL (ref 80.0–100.0)
Monocytes Absolute: 1 10*3/uL (ref 0.1–1.0)
Monocytes Relative: 11 %
Neutro Abs: 7 10*3/uL (ref 1.7–7.7)
Neutrophils Relative %: 78 %
Platelets: 420 10*3/uL — ABNORMAL HIGH (ref 150–400)
RBC: 4.05 MIL/uL — ABNORMAL LOW (ref 4.22–5.81)
RDW: 20.1 % — ABNORMAL HIGH (ref 11.5–15.5)
WBC: 9 10*3/uL (ref 4.0–10.5)
nRBC: 0 % (ref 0.0–0.2)

## 2018-02-19 LAB — BRAIN NATRIURETIC PEPTIDE: B Natriuretic Peptide: 2392 pg/mL — ABNORMAL HIGH (ref 0.0–100.0)

## 2018-02-19 NOTE — ED Notes (Signed)
Pt to triage via w/c with no distress noted brought in by EMS; last dialysis on Tuesday; duoneb admin enroute; O2 sat 91% on ra; out of meds x 3wks; FSBS 150

## 2018-02-19 NOTE — ED Triage Notes (Signed)
Pt reports that he needs a paracentesis done - c/o shortness of breath - c/o difficulty ambulating - last dialysis was last Tuesday - he reports he had to go pay bills and then he could not get out of bed - pt last took meds 3 weeks ago - he reports that his doctor would not refill his new current medications

## 2018-02-19 NOTE — ED Notes (Signed)
Charge nurse notified of pt's abnormal lab values (troponin, K+, Calcium)

## 2018-02-20 ENCOUNTER — Emergency Department
Admission: EM | Admit: 2018-02-20 | Discharge: 2018-02-20 | Disposition: A | Discharge status: Home / Self Care | Payer: Medicare Other | Attending: Emergency Medicine | Admitting: Emergency Medicine

## 2018-02-20 ENCOUNTER — Other Ambulatory Visit: Payer: Self-pay | Admitting: Nephrology

## 2018-02-20 ENCOUNTER — Ambulatory Visit: Admission: RE | Admit: 2018-02-20 | Payer: Medicare Other | Source: Ambulatory Visit

## 2018-02-20 DIAGNOSIS — E875 Hyperkalemia: Secondary | ICD-10-CM | POA: Diagnosis not present

## 2018-02-20 DIAGNOSIS — J441 Chronic obstructive pulmonary disease with (acute) exacerbation: Secondary | ICD-10-CM

## 2018-02-20 DIAGNOSIS — D631 Anemia in chronic kidney disease: Secondary | ICD-10-CM | POA: Diagnosis not present

## 2018-02-20 DIAGNOSIS — N186 End stage renal disease: Secondary | ICD-10-CM

## 2018-02-20 DIAGNOSIS — R188 Other ascites: Secondary | ICD-10-CM

## 2018-02-20 DIAGNOSIS — Z992 Dependence on renal dialysis: Secondary | ICD-10-CM

## 2018-02-20 DIAGNOSIS — I120 Hypertensive chronic kidney disease with stage 5 chronic kidney disease or end stage renal disease: Secondary | ICD-10-CM | POA: Diagnosis not present

## 2018-02-20 MED ORDER — CALCIUM CARBONATE ANTACID 500 MG PO CHEW
CHEWABLE_TABLET | ORAL | Status: AC
  Filled 2018-02-20: qty 1

## 2018-02-20 MED ORDER — HEPARIN SODIUM (PORCINE) 1000 UNIT/ML DIALYSIS
20.0000 [IU]/kg | INTRAMUSCULAR | Status: DC | PRN
  Administered 2018-02-20: 1500 [IU] via INTRAVENOUS_CENTRAL
  Filled 2018-02-20: qty 1.5

## 2018-02-20 MED ORDER — IPRATROPIUM-ALBUTEROL 0.5-2.5 (3) MG/3ML IN SOLN
9.0000 mL | Freq: Once | RESPIRATORY_TRACT | Status: AC
  Administered 2018-02-20: 9 mL via RESPIRATORY_TRACT
  Filled 2018-02-20: qty 3

## 2018-02-20 MED ORDER — IPRATROPIUM-ALBUTEROL 0.5-2.5 (3) MG/3ML IN SOLN
RESPIRATORY_TRACT | Status: AC
  Filled 2018-02-20: qty 6

## 2018-02-20 MED ORDER — ALUM & MAG HYDROXIDE-SIMETH 200-200-20 MG/5ML PO SUSP
30.0000 mL | Freq: Once | ORAL | Status: AC
  Administered 2018-02-20: 30 mL via ORAL
  Filled 2018-02-20: qty 30

## 2018-02-20 MED ORDER — CALCIUM GLUCONATE 10 % IV SOLN
1.0000 g | Freq: Once | INTRAVENOUS | Status: AC
  Administered 2018-02-20: 1 g via INTRAVENOUS
  Filled 2018-02-20: qty 10

## 2018-02-20 MED ORDER — CHLORHEXIDINE GLUCONATE CLOTH 2 % EX PADS
6.0000 | MEDICATED_PAD | Freq: Every day | CUTANEOUS | Status: DC
  Filled 2018-02-20: qty 6

## 2018-02-20 MED ORDER — ALUM & MAG HYDROXIDE-SIMETH 200-200-20 MG/5ML PO SUSP
30.0000 mL | Freq: Once | ORAL | Status: AC
  Administered 2018-02-20: 30 mL via ORAL

## 2018-02-20 MED ORDER — PATIROMER SORBITEX CALCIUM 8.4 G PO PACK
8.4000 g | PACK | Freq: Every day | ORAL | Status: DC
  Administered 2018-02-20: 8.4 g via ORAL
  Filled 2018-02-20: qty 1

## 2018-02-20 MED ORDER — EPOETIN ALFA 10000 UNIT/ML IJ SOLN
4000.0000 [IU] | INTRAMUSCULAR | Status: DC
  Administered 2018-02-20: 4000 [IU] via INTRAVENOUS

## 2018-02-20 MED ORDER — ALUM & MAG HYDROXIDE-SIMETH 200-200-20 MG/5ML PO SUSP
ORAL | Status: AC
  Filled 2018-02-20: qty 30

## 2018-02-20 MED ORDER — METHYLPREDNISOLONE SODIUM SUCC 125 MG IJ SOLR
125.0000 mg | Freq: Once | INTRAMUSCULAR | Status: AC
  Administered 2018-02-20: 125 mg via INTRAVENOUS
  Filled 2018-02-20: qty 2

## 2018-02-20 NOTE — Progress Notes (Signed)
Pre HD Assessment    02/20/18 0815  Neurological  Level of Consciousness Alert  Orientation Level Oriented X4  Respiratory  Respiratory Pattern Labored;Dyspnea with exertion  Chest Assessment Chest expansion symmetrical  Bilateral Breath Sounds Coarse crackles;Expiratory wheezes  Cough Dry  Cardiac  Pulse Regular  Heart Sounds S1, S2  ECG Monitor Yes  Cardiac Rhythm NSR  Vascular  R Radial Pulse +2  L Radial Pulse +2  Edema Generalized  Generalized Edema +2  Psychosocial  Psychosocial (WDL) WDL  Patient Behaviors Cooperative;Calm  Needs Expressed Physical  Emotional support given Given to patient

## 2018-02-20 NOTE — Progress Notes (Signed)
Post HD Assessment    02/20/18 1230  Neurological  Level of Consciousness Alert  Orientation Level Oriented X4  Respiratory  Respiratory Pattern Unlabored;Regular  Chest Assessment Chest expansion symmetrical  Cough None  Cardiac  Pulse Regular  Heart Sounds S1, S2  ECG Monitor Yes  Cardiac Rhythm NSR  Vascular  R Radial Pulse +2  L Radial Pulse +2  Edema Generalized  Generalized Edema +2  Psychosocial  Psychosocial (WDL) WDL  Patient Behaviors Cooperative;Calm  Needs Expressed Physical  Emotional support given Given to patient

## 2018-02-20 NOTE — ED Provider Notes (Signed)
[facility] Emergency Department Provider Note       Time seen: ----------------------------------------- 1:16 PM on 02/20/2018 -----------------------------------------   I have reviewed the triage vital signs and the nursing notes.  HISTORY   Chief Complaint Shortness of Breath    HPI Stanley [name] is a 57 y.o. male with a history of alcohol abuse, CHF, cirrhosis, end-stage renal disease on dialysis who presents to the ED for reevaluation after dialysis.  Patient is currently feeling better and eating lunch.  He also states he is due for paracentesis.  Past Medical History:  Diagnosis Date  . Alcohol abuse   . CHF (congestive heart failure) ([place])   . Cirrhosis ([place])   . Coronary artery disease 2009  . Drug abuse ([place])   . End stage renal disease on dialysis [facility]) NEPHROLOGIST-   DR [facility]  IN [place]   HEMODIALYSIS --   TUES/  THURS/  SAT  . Gastrointestinal bleed 06/13/2017   From chart...hx of multiple GI bleeds  . GERD (gastroesophageal reflux disease)   . Hyperlipidemia   . Hypertension   . PAD (peripheral artery disease) ([place])   . Renal insufficiency    Per pt, 32 oz fluid restriction per day  . S/P triple vessel bypass 06/09/2016   2009ish  . Suicidal ideation    & HOMICIDAL IDEATION --  06-16-2013   ADMITTED TO BEHAVIOR HEALTH    Patient Active Problem List   Diagnosis Date Noted  . Healthcare-associated pneumonia 01/15/2018  . Acute on chronic respiratory failure ([place]) 01/15/2018  . Acute pulmonary edema ([place]) 12/23/2017  . Acute respiratory failure ([place]) 10/29/2017  . ESRD (end stage renal disease) on dialysis ([place]) 07/28/2017  . Protein-calorie malnutrition, severe 06/14/2017  . Encounter for dialysis [facility])   . Palliative care by specialist   . Goals of care, counseling/discussion   . Malnutrition of moderate degree 06/05/2017  . Secondary esophageal varices without bleeding ([place])   . Stomach irritation   .  Idiopathic esophageal varices without bleeding ([place])   . Alcoholic hepatitis with ascites 05/24/2017  . ESRD (end stage renal disease) ([place]) 04/28/2017  . Uremia 03/08/2017  . ESRD on hemodialysis ([place]) 03/03/2017  . Weakness 02/28/2017  . Hypocalcemia 02/22/2017  . Shortness of breath 11/26/2016  . COPD (chronic obstructive pulmonary disease) ([place]) 10/30/2016  . COPD exacerbation ([place]) 10/29/2016  . Anemia   . Heme positive stool   . Ulceration of intestine   . Benign neoplasm of transverse colon   . Acute gastrointestinal hemorrhage   . Esophageal candidiasis ([place])   . Angiodysplasia of intestinal tract   . Acute respiratory failure with hypoxia ([place]) 07/03/2016  . GI bleeding 06/24/2016  . Rectal bleeding 06/14/2016  . Anemia of chronic disease 06/01/2016  . MRSA carrier 06/01/2016  . Chronic renal failure 05/23/2016  . Ischemic heart disease 05/23/2016  . Angiodysplasia of small intestine   . Melena   . Small bowel bleed not requiring more than 4 units of blood in 24 hours, ICU, or surgery   . Anemia due to chronic blood loss   . Abdominal pain 05/05/2016  . Acute posthemorrhagic anemia 04/17/2016  . Gastrointestinal bleed 04/17/2016  . History of esophagogastroduodenoscopy (EGD) 04/17/2016  . Elevated troponin 04/17/2016  . Alcohol abuse 04/17/2016  . Upper GI bleed 01/19/2016  . Blood in stool   . Angiodysplasia of stomach and duodenum with hemorrhage   . Gastritis   . Reflux esophagitis   . GI bleed  05/16/2015  . Acute GI bleeding   . Symptomatic anemia 04/30/2015  . HTN (hypertension) 04/06/2015  . GERD (gastroesophageal reflux disease) 04/06/2015  . HLD (hyperlipidemia) 04/06/2015  . Dyspnea 04/06/2015  . Cirrhosis of liver with ascites ([place]) 04/06/2015  . Ascites 04/06/2015  . GIB (gastrointestinal bleeding) 03/23/2015  . Homicidal ideation 06/19/2013  . Suicidal intent 06/19/2013  . Homicidal ideations 06/19/2013  . Hyperkalemia 06/16/2013  . Mandible  fracture ([place]) 06/05/2013  . Fracture, mandible ([place]) 06/02/2013  . Coronary atherosclerosis of native coronary artery 06/02/2013  . ESRD on dialysis ([place]) 06/02/2013  . Mandible open fracture ([place]) 06/02/2013    Past Surgical History:  Procedure Laterality Date  . A/V FISTULAGRAM Right 06/06/2017   Procedure: A/V FISTULAGRAM;  Surgeon: Katha Cabal, MD;  Location: [place] [facility];  Service: Cardiovascular;  Laterality: Right;  . A/V SHUNT INTERVENTION N/A 06/06/2017   Procedure: A/V SHUNT INTERVENTION;  Surgeon: Katha Cabal, MD;  Location: [place] [facility];  Service: Cardiovascular;  Laterality: N/A;  . AGILE CAPSULE N/A 06/19/2016   Procedure: AGILE CAPSULE;  Surgeon: [name], MD;  Location: [facility] ENDOSCOPY;  Service: Endoscopy;  Laterality: N/A;  . COLONOSCOPY WITH PROPOFOL N/A 06/18/2016   Procedure: COLONOSCOPY WITH PROPOFOL;  Surgeon: [name], MD;  Location: [facility] ENDOSCOPY;  Service: Endoscopy;  Laterality: N/A;  . COLONOSCOPY WITH PROPOFOL N/A 08/12/2016   Procedure: COLONOSCOPY WITH PROPOFOL;  Surgeon: [name], MD;  Location: [facility] ENDOSCOPY;  Service: Endoscopy;  Laterality: N/A;  . COLONOSCOPY WITH PROPOFOL N/A 05/05/2017   Procedure: COLONOSCOPY WITH PROPOFOL;  Surgeon: Manya Silvas, MD;  Location[facility] ENDOSCOPY;  Service: Endoscopy;  Laterality: N/A;  . CORONARY ANGIOPLASTY  ?   PT UNABLE TO TELL IF  BEFORE OR AFTER  CABG  . CORONARY ARTERY BYPASS GRAFT  2008  ([place] , [place])   3 VESSEL  . DIALYSIS FISTULA CREATION  LAST SURGERY  APPOX  2008  . ENTEROSCOPY N/A 05/10/2016   Procedure: ENTEROSCOPY;  Surgeon: Jerene Bears, MD;  Location: [place];  Service: Gastroenterology;  Laterality: N/A;  . ENTEROSCOPY N/A 08/12/2016   Procedure: ENTEROSCOPY;  Surgeon: [name], MD;  Location: [facility] ENDOSCOPY;  Service: Endoscopy;  Laterality: N/A;  . ENTEROSCOPY Left 06/03/2017   Procedure: ENTEROSCOPY;  Surgeon: Virgel Manifold, MD;  Location: [facility]  ENDOSCOPY;  Service: Endoscopy;  Laterality: Left;  Procedure date will ultimately depend on when patient is medically optimized before the procedure, pending hemodialysis and blood transfusions etc. Will place on schedule and change depending on clinical status.   . ENTEROSCOPY N/A 06/05/2017   Procedure: ENTEROSCOPY;  Surgeon: Virgel Manifold, MD;  Location: [facility] ENDOSCOPY;  Service: Endoscopy;  Laterality: N/A;  . ENTEROSCOPY N/A 06/15/2017   Procedure: Push ENTEROSCOPY;  Surgeon: [name], MD;  Location: [facility] ENDOSCOPY;  Service: Endoscopy;  Laterality: N/A;  . ESOPHAGOGASTRODUODENOSCOPY N/A 05/07/2015   Procedure: ESOPHAGOGASTRODUODENOSCOPY (EGD);  Surgeon: Hulen Luster, MD;  Location: [facility] ENDOSCOPY;  Service: Endoscopy;  Laterality: N/A;  . ESOPHAGOGASTRODUODENOSCOPY (EGD) WITH PROPOFOL N/A 05/17/2015   Procedure: ESOPHAGOGASTRODUODENOSCOPY (EGD) WITH PROPOFOL;  Surgeon: [name], MD;  Location: [facility] ENDOSCOPY;  Service: Endoscopy;  Laterality: N/A;  . ESOPHAGOGASTRODUODENOSCOPY (EGD) WITH PROPOFOL N/A 01/20/2016   Procedure: ESOPHAGOGASTRODUODENOSCOPY (EGD) WITH PROPOFOL;  Surgeon: [name], MD;  Location: [facility] ENDOSCOPY;  Service: Endoscopy;  Laterality: N/A;  . ESOPHAGOGASTRODUODENOSCOPY (EGD) WITH PROPOFOL N/A 04/17/2016   Procedure: ESOPHAGOGASTRODUODENOSCOPY (EGD) WITH PROPOFOL;  Surgeon: Lin Landsman, MD;  Location: [facility] ENDOSCOPY;  Service: Gastroenterology;  Laterality: N/A;  . ESOPHAGOGASTRODUODENOSCOPY (EGD) WITH PROPOFOL  05/09/2016   Procedure: ESOPHAGOGASTRODUODENOSCOPY (EGD) WITH PROPOFOL;  Surgeon: Jerene Bears, MD;  Location: [place];  Service: Endoscopy;;  . ESOPHAGOGASTRODUODENOSCOPY (EGD) WITH PROPOFOL N/A 06/16/2016   Procedure: ESOPHAGOGASTRODUODENOSCOPY (EGD) WITH PROPOFOL;  Surgeon: [name], MD;  Location: [facility] ENDOSCOPY;  Service: Endoscopy;  Laterality: N/A;  . ESOPHAGOGASTRODUODENOSCOPY (EGD) WITH PROPOFOL N/A 05/05/2017   Procedure:  ESOPHAGOGASTRODUODENOSCOPY (EGD) WITH PROPOFOL;  Surgeon: Manya Silvas, MD;  Location: [facility] ENDOSCOPY;  Service: Endoscopy;  Laterality: N/A;  . ESOPHAGOGASTRODUODENOSCOPY (EGD) WITH PROPOFOL N/A 06/15/2017   Procedure: ESOPHAGOGASTRODUODENOSCOPY (EGD) WITH PROPOFOL;  Surgeon: [name], MD;  Location: [facility] ENDOSCOPY;  Service: Endoscopy;  Laterality: N/A;  . GIVENS CAPSULE STUDY N/A 05/07/2016   Procedure: GIVENS CAPSULE STUDY;  Surgeon: Doran Stabler, MD;  Location: [place];  Service: Endoscopy;  Laterality: N/A;  . MANDIBULAR HARDWARE REMOVAL N/A 07/29/2013   Procedure: REMOVAL OF ARCH BARS;  Surgeon: Theodoro Kos, DO;  Location: [place];  Service: Plastics;  Laterality: N/A;  . ORIF MANDIBULAR FRACTURE N/A 06/05/2013   Procedure: REPAIR OF MANDIBULAR FRACTURE x 2 with maxillo-mandibular fixation ;  Surgeon: Theodoro Kos, DO;  Location: [place];  Service: Plastics;  Laterality: N/A;  . PARACENTESIS    . PERIPHERAL ARTERIAL STENT GRAFT Left     Allergies Patient has no known allergies.  Social History Social History   Tobacco Use  . Smoking status: Current Every Day Smoker    Packs/day: 0.15    Years: 40.00    Pack years: 6.00    Types: Cigarettes  . Smokeless tobacco: Never Used  Substance Use Topics  . Alcohol use: No    Comment: pt reports quitting after learning about cirrhosis  . Drug use: No    Frequency: 7.0 times per week    Types: Marijuana, Cocaine   Review of Systems Constitutional: Negative for fever. Cardiovascular: Negative for chest pain. Respiratory: Positive for recent shortness of breath Gastrointestinal: Negative for abdominal pain, positive for abdominal distention Musculoskeletal: Negative for back pain. Skin: Negative for rash. Neurological: Negative for headaches, focal weakness or numbness.  All systems negative/normal/unremarkable except as stated in the  HPI  ____________________________________________   PHYSICAL EXAM:  VITAL SIGNS: ED Triage Vitals  Enc Vitals Group     BP 02/20/18 0030 (!) 139/92     Pulse Rate 02/20/18 0031 85     Resp 02/20/18 0031 (!) 28     Temp 02/20/18 0815 98.1 F (36.7 C)     Temp Source 02/20/18 0815 Oral     SpO2 02/20/18 0031 93 %     Weight 02/19/18 2212 170 lb (77.1 kg)     Height 02/19/18 2212 6\' 3"  (1.905 m)     Head Circumference --      Peak Flow --      Pain Score 02/19/18 2211 8     Pain Loc --      Pain Edu? --      Excl. in [place]? --    Constitutional: Alert and oriented.  No distress Eyes: Conjunctivae are normal. Normal extraocular movements. Cardiovascular: Normal rate, regular rhythm. No murmurs, rubs, or gallops. Respiratory: Normal respiratory effort without tachypnea nor retractions. Breath sounds are clear and equal bilaterally. No wheezes/rales/rhonchi. Gastrointestinal: Distended, normal bowel sounds Musculoskeletal: Nontender with normal range of motion in extremities. No lower extremity tenderness nor edema. Neurologic:  Normal speech and  language. No gross focal neurologic deficits are appreciated.  Skin:  Skin is warm, dry and intact. No rash noted. Psychiatric: Mood and affect are normal. Speech and behavior are normal.  ____________________________________________  ED COURSE:  As part of my medical decision making, I reviewed the following data within the [place] History obtained from family if available, nursing notes, old chart and ekg, as well as notes from prior ED visits. Patient presented for reevaluation after dialysis, we will assess with labs and imaging as indicated at this time.   Procedures ____________________________________________   LABS (pertinent positives/negatives)  Labs Reviewed  TROPONIN I - Abnormal; Notable for the following components:      Result Value   Troponin I 0.07 (*)    All other components within normal limits   CBC WITH DIFFERENTIAL/PLATELET - Abnormal; Notable for the following components:   RBC 4.05 (*)    Hemoglobin 10.1 (*)    HCT 35.7 (*)    MCH 24.9 (*)    MCHC 28.3 (*)    RDW 20.1 (*)    Platelets 420 (*)    All other components within normal limits  COMPREHENSIVE METABOLIC PANEL - Abnormal; Notable for the following components:   Potassium 6.3 (*)    Glucose, Bld 130 (*)    BUN 56 (*)    Creatinine, Ser 14.25 (*)    Calcium 8.2 (*)    Albumin 3.1 (*)    GFR calc non Af Amer 3 (*)    GFR calc Af Amer 4 (*)    All other components within normal limits  BRAIN NATRIURETIC PEPTIDE - Abnormal; Notable for the following components:   B Natriuretic Peptide 2,392.0 (*)    All other components within normal limits   ____________________________________________  DIFFERENTIAL DIAGNOSIS   End-stage renal disease on dialysis, cirrhosis  FINAL ASSESSMENT AND PLAN  End-stage renal disease on dialysis   Plan: The patient had presented for reevaluation after dialysis.  Patient well-known to the ER, I do not see a need to repeat labs at this time.  He will be resent to special procedures for paracentesis.   Laurence Aly, MD   Note: This note was generated in part or whole with voice recognition software. Voice recognition is usually quite accurate but there are transcription errors that can and very often do occur. I apologize for any typographical errors that were not detected and corrected.     Earleen Newport, MD 02/20/18 1318

## 2018-02-20 NOTE — Progress Notes (Signed)
Pre HD Treatment   02/20/18 0815  Hand-Off documentation  Report given to (Full Name) Beatris Ship, RN   Report received from (Full Name) Kenney Houseman, RN   Vital Signs  Temp 98.1 F (36.7 C)  Temp Source Oral  Pulse Rate 91  Pulse Rate Source Monitor  Resp (!) 21  BP 140/83  BP Location Left Arm  BP Method Automatic  Patient Position (if appropriate) Sitting  Oxygen Therapy  SpO2 90 %  O2 Device Nasal Cannula  O2 Flow Rate (L/min) 4 L/min  Pulse Oximetry Type Continuous  Pain Assessment  Pain Scale 0-10  Pain Score 8  Pain Type Acute pain  Pain Intervention(s) MD notified (Comment);Medication (See eMAR)  Dialysis Weight  Weight 77.1 kg  Type of Weight Pre-Dialysis  Time-Out for Hemodialysis  What Procedure? HD  Pt Identifiers(min of two) First/Last Name;MRN/Account#  Correct Site? Yes  Correct Side? Yes  Correct Procedure? Yes  Consents Verified? Yes  Rad Studies Available? Yes  Safety Precautions Reviewed? Yes  Engineer, civil (consulting) Number 1 (1A)  Station Number 3  UF/Alarm Test Passed  Conductivity: Meter 14  Conductivity: Machine  13.9  pH 7.4  Reverse Osmosis Main  Normal Saline Lot Number [id number]  Dialyzer Lot Number [id number]  Disposable Set Lot Number [id number]  Machine Temperature 98.6 F (37 C)  Musician and Audible Yes  Blood Lines Intact and Secured Yes  Pre Treatment Patient Checks  Vascular access used during treatment Catheter  Patient is receiving dialysis in a chair Yes  Hepatitis B Surface Antigen Results Negative  Date Hepatitis B Surface Antigen Drawn 06/16/17  Hepatitis B Surface Antibody  (>10)  Date Hepatitis B Surface Antibody Drawn 06/16/17  Hemodialysis Consent Verified Yes  Hemodialysis Standing Orders Initiated Yes  ECG (Telemetry) Monitor On Yes  Prime Ordered Normal Saline  Length of  DialysisTreatment -hour(s) 3.5 Hour(s)  Dialysis Treatment Comments Na 140  Dialyzer Elisio 17H NR  Dialysate 2K, 2.5 Ca  Dialysis  Anticoagulant Heparin  Dialysate Flow Ordered 800  Blood Flow Rate Ordered 400 mL/min  Ultrafiltration Goal 3 Liters  Dialysis Blood Pressure Support Ordered Normal Saline  Education / Care Plan  Dialysis Education Provided Yes  Documented Education in Care Plan Yes  Hemodialysis Catheter Right Internal jugular Double-lumen  No Placement Date or Time found.   Placed prior to admission: Yes  Orientation: Right  Access Location: Internal jugular  Hemodialysis Catheter Type: Double-lumen  Site Condition No complications  Blue Lumen Status Flushed  Red Lumen Status Flushed  Dressing Type Gauze/Drain sponge  Dressing Status Dry;Intact  Interventions New dressing  Dressing Change Due 02/27/18

## 2018-02-20 NOTE — Progress Notes (Signed)
[facility]  ROUNDING NOTE   Subjective:   Mr. Hammad Finkler brought to [facility] with hyperkalemia and ascites.   Placed on hemodialysis treatment. Tolerating treatment well.   Objective:  Vital signs in last 24 hours:  Pulse Rate:  [79-88] 79 (12/10 0400) Resp:  [17-29] 23 (12/10 0730) BP: (126-154)/(71-99) 154/71 (12/10 0730) SpO2:  [92 %-100 %] 94 % (12/10 0400) Weight:  [77.1 kg] 77.1 kg (12/09 2212)  Weight change:  Filed Weights   02/19/18 2212  Weight: 77.1 kg    Intake/Output: No intake/output data recorded.   Intake/Output this shift:  No intake/output data recorded.  Physical Exam: General: NAD,   Head: Normocephalic, atraumatic. Moist oral mucosal membranes  Eyes: Anicteric, PERRL  Neck: Supple, trachea midline  Lungs:  Clear to auscultation  Heart: Regular rate and rhythm  Abdomen:  Soft, nontender, +ascites  Extremities: no peripheral edema.  Neurologic: Nonfocal, moving all four extremities  Skin: No lesions  Access: RIJ CVC    Basic Metabolic Panel: Recent Labs  Lab 02/19/18 2215  NA 139  K 6.3*  CL 103  CO2 23  GLUCOSE 130*  BUN 56*  CREATININE 14.25*  CALCIUM 8.2*    Liver Function Tests: Recent Labs  Lab 02/19/18 2215  AST 21  ALT 14  ALKPHOS 103  BILITOT 0.6  PROT 7.0  ALBUMIN 3.1*   No results for input(s): LIPASE, AMYLASE in the last 168 hours. No results for input(s): AMMONIA in the last 168 hours.  CBC: Recent Labs  Lab 02/19/18 2215  WBC 9.0  NEUTROABS 7.0  HGB 10.1*  HCT 35.7*  MCV 88.1  PLT 420*    Cardiac Enzymes: Recent Labs  Lab 02/19/18 2215  TROPONINI 0.07*    BNP: Invalid input(s): POCBNP  CBG: No results for input(s): GLUCAP in the last 168 hours.  Microbiology: Results for orders placed or performed during the hospital encounter of 01/29/18  Culture, blood (Routine x 2)     Status: None   Collection Time: 01/29/18  3:25 PM  Result Value Ref Range Status   Specimen  Description BLOOD BLOOD LEFT FOREARM  Final   Special Requests   Final    BOTTLES DRAWN AEROBIC AND ANAEROBIC Blood Culture results may not be optimal due to an inadequate volume of blood received in culture bottles   Culture   Final    NO GROWTH 5 DAYS Performed at [facility], [address], [place], [place] [address]    Report Status 02/03/2018 FINAL  Final  Culture, body fluid-bottle     Status: None   Collection Time: 01/29/18  5:05 PM  Result Value Ref Range Status   Specimen Description PERITONEAL  Final   Special Requests NONE  Final   Culture   Final    NO GROWTH 5 DAYS Performed at [facility], [place] [address], [place], [place] [address]    Report Status 02/03/2018 FINAL  Final  Gram stain     Status: None   Collection Time: 01/29/18  5:05 PM  Result Value Ref Range Status   Specimen Description PERITONEAL  Final   Special Requests NONE  Final   Gram Stain   Final    WBC PRESENT, PREDOMINANTLY MONONUCLEAR NO ORGANISMS SEEN CYTOSPIN SMEAR Performed at [facility], [place] [address], [place], [place] [address]    Report Status 01/30/2018 FINAL  Final  Culture, blood (Routine x 2)     Status: None  Collection Time: 01/30/18  2:01 AM  Result Value Ref Range Status   Specimen Description BLOOD LEFT HAND  Final   Special Requests   Final    BOTTLES DRAWN AEROBIC AND ANAEROBIC Blood Culture adequate volume   Culture   Final    NO GROWTH 5 DAYS Performed at [facility], [address] [place], [place] [address]    Report Status 02/04/2018 FINAL  Final    Coagulation Studies: No results for input(s): LABPROT, INR in the last 72 hours.  Urinalysis: No results for input(s): COLORURINE, LABSPEC, PHURINE, GLUCOSEU, HGBUR, BILIRUBINUR, KETONESUR, PROTEINUR, UROBILINOGEN, NITRITE, LEUKOCYTESUR in the last 72 hours.  Invalid input(s): APPERANCEUR    Imaging: Dg Chest 2 View  Result Date: 02/19/2018 CLINICAL DATA:  Shortness of breath  EXAM: CHEST - 2 VIEW COMPARISON:  CT 01/29/2018, radiographs 01/29/2018, 01/16/2018 01/15/2018, 12/26/2017 FINDINGS: Post sternotomy changes. Right-sided central venous catheter tip over the SVC. Cardiomegaly. Emphysematous disease. Streaky atelectasis at the bases. Aortic atherosclerosis. No pneumothorax. IMPRESSION: 1. Mild cardiomegaly without edema 2. Emphysematous disease.  Streaky atelectasis at the bases. Electronically Signed   By: Donavan Foil M.D.   On: 02/19/2018 22:31     Medications:    . Chlorhexidine Gluconate Cloth  6 each Topical Q0600  . patiromer  8.4 g Oral Daily     Assessment/ Plan:  Mr. Anastasios Melander is a 57 y.o. black male withend stage renal disease on hemodialysis secondary to Alport's syndrome, ascites, hypertension, anemia of chronic kidney disease, coronary artery disease, peripheral vascular disease, hyperlipidemia, gastrointestinal AVMs, pulmonary hypertension  [facility] TTS 80.5 kg RIJ permcath.   1. Hyperkalemia: requiring hemodialysis treatment with 2K bath.   2. End stage renal disease   Continue TTS schedule  3. Ascites - ultrasound guided paracentesis last on 11/19 - scheduled for today as an outpatient   4. Hypertension: at goal.    4. Anemia of chronic kidney disease: hemoglobin 10.1 - EPO with TTS treatments.    LOS: 0 Azhane Eckart 12/10/20198:32 AM

## 2018-02-20 NOTE — Progress Notes (Signed)
HD Tx started    02/20/18 0830  Vital Signs  Pulse Rate 80  Pulse Rate Source Monitor  Resp 20  BP 139/89  BP Location Left Arm  BP Method Automatic  Patient Position (if appropriate) Sitting  Oxygen Therapy  SpO2 94 %  O2 Device Nasal Cannula  O2 Flow Rate (L/min) 4 L/min  Pulse Oximetry Type Continuous  During Hemodialysis Assessment  Blood Flow Rate (mL/min) 400 mL/min  Arterial Pressure (mmHg) -210 mmHg  Venous Pressure (mmHg) 120 mmHg  Transmembrane Pressure (mmHg) 60 mmHg  Ultrafiltration Rate (mL/min) 1440 mL/min  Dialysate Flow Rate (mL/min) 800 ml/min  Conductivity: Machine  13.9  HD Safety Checks Performed Yes  Dialysis Fluid Bolus Normal Saline  Bolus Amount (mL) 250 mL  Intra-Hemodialysis Comments Tx initiated

## 2018-02-20 NOTE — Progress Notes (Signed)
HD Tx completed    02/20/18 1215  Vital Signs  Pulse Rate 82  Pulse Rate Source Monitor  Resp 17  BP (!) 134/91  BP Location Left Arm  BP Method Automatic  Patient Position (if appropriate) Sitting  Oxygen Therapy  SpO2 100 %  O2 Device Nasal Cannula  O2 Flow Rate (L/min) 4 L/min  During Hemodialysis Assessment  HD Safety Checks Performed Yes  KECN 67.6 KECN  Dialysis Fluid Bolus Normal Saline  Bolus Amount (mL) 250 mL  Intra-Hemodialysis Comments Tx completed;Tolerated well

## 2018-02-20 NOTE — ED Notes (Signed)
Patient transported to dialysis unit at this time.  Will continue to monitor.

## 2018-02-20 NOTE — ED Provider Notes (Addendum)
[facility] Emergency Department Provider Note  ____________________________________________   First MD Initiated Contact with Patient 02/20/18 0017     (approximate)  I have reviewed the triage vital signs and the nursing notes.   HISTORY  Chief Complaint Shortness of Breath   HPI Stanley [name] is a 57 y.o. male with a history of alcohol abuse, CHF, COPD and end-stage renal disease was presented to the emergency department today complaining of shortness of breath.  He states that he has not had dialysis in 6 days.  He says that his last session was last Monday.  He says that his abdomen also feels like it is filling up and this is what is causing his shortness of breath.  The patient has a history of ascites.  Denying any pain at this time.   Past Medical History:  Diagnosis Date  . Alcohol abuse   . CHF (congestive heart failure) ([place])   . Cirrhosis ([place])   . Coronary artery disease 2009  . Drug abuse ([place])   . End stage renal disease on dialysis [facility] NEPHROLOGIST-   DR [facility]  IN [place]   HEMODIALYSIS --   TUES/  THURS/  SAT  . Gastrointestinal bleed 06/13/2017   From chart...hx of multiple GI bleeds  . GERD (gastroesophageal reflux disease)   . Hyperlipidemia   . Hypertension   . PAD (peripheral artery disease) ([place])   . Renal insufficiency    Per pt, 32 oz fluid restriction per day  . S/P triple vessel bypass 06/09/2016   2009ish  . Suicidal ideation    & HOMICIDAL IDEATION --  06-16-2013   ADMITTED TO BEHAVIOR HEALTH    Patient Active Problem List   Diagnosis Date Noted  . Healthcare-associated pneumonia 01/15/2018  . Acute on chronic respiratory failure ([place]) 01/15/2018  . Acute pulmonary edema ([place]) 12/23/2017  . Acute respiratory failure ([place]) 10/29/2017  . ESRD (end stage renal disease) on dialysis ([place]) 07/28/2017  . Protein-calorie malnutrition, severe 06/14/2017  . Encounter for dialysis [facility])   . Palliative care by  specialist   . Goals of care, counseling/discussion   . Malnutrition of moderate degree 06/05/2017  . Secondary esophageal varices without bleeding ([place])   . Stomach irritation   . Idiopathic esophageal varices without bleeding ([place])   . Alcoholic hepatitis with ascites 05/24/2017  . ESRD (end stage renal disease) ([place]) 04/28/2017  . Uremia 03/08/2017  . ESRD on hemodialysis ([place]) 03/03/2017  . Weakness 02/28/2017  . Hypocalcemia 02/22/2017  . Shortness of breath 11/26/2016  . COPD (chronic obstructive pulmonary disease) ([place]) 10/30/2016  . COPD exacerbation ([place]) 10/29/2016  . Anemia   . Heme positive stool   . Ulceration of intestine   . Benign neoplasm of transverse colon   . Acute gastrointestinal hemorrhage   . Esophageal candidiasis ([place])   . Angiodysplasia of intestinal tract   . Acute respiratory failure with hypoxia ([place]) 07/03/2016  . GI bleeding 06/24/2016  . Rectal bleeding 06/14/2016  . Anemia of chronic disease 06/01/2016  . MRSA carrier 06/01/2016  . Chronic renal failure 05/23/2016  . Ischemic heart disease 05/23/2016  . Angiodysplasia of small intestine   . Melena   . Small bowel bleed not requiring more than 4 units of blood in 24 hours, ICU, or surgery   . Anemia due to chronic blood loss   . Abdominal pain 05/05/2016  . Acute posthemorrhagic anemia 04/17/2016  . Gastrointestinal bleed 04/17/2016  . History of esophagogastroduodenoscopy (  EGD) 04/17/2016  . Elevated troponin 04/17/2016  . Alcohol abuse 04/17/2016  . Upper GI bleed 01/19/2016  . Blood in stool   . Angiodysplasia of stomach and duodenum with hemorrhage   . Gastritis   . Reflux esophagitis   . GI bleed 05/16/2015  . Acute GI bleeding   . Symptomatic anemia 04/30/2015  . HTN (hypertension) 04/06/2015  . GERD (gastroesophageal reflux disease) 04/06/2015  . HLD (hyperlipidemia) 04/06/2015  . Dyspnea 04/06/2015  . Cirrhosis of liver with ascites ([place]) 04/06/2015  . Ascites 04/06/2015  .  GIB (gastrointestinal bleeding) 03/23/2015  . Homicidal ideation 06/19/2013  . Suicidal intent 06/19/2013  . Homicidal ideations 06/19/2013  . Hyperkalemia 06/16/2013  . Mandible fracture ([place]) 06/05/2013  . Fracture, mandible ([place]) 06/02/2013  . Coronary atherosclerosis of native coronary artery 06/02/2013  . ESRD on dialysis ([place]) 06/02/2013  . Mandible open fracture ([place]) 06/02/2013    Past Surgical History:  Procedure Laterality Date  . A/V FISTULAGRAM Right 06/06/2017   Procedure: A/V FISTULAGRAM;  Surgeon: Katha Cabal, MD;  Location: [place] CV LAB;  Service: Cardiovascular;  Laterality: Right;  . A/V SHUNT INTERVENTION N/A 06/06/2017   Procedure: A/V SHUNT INTERVENTION;  Surgeon: Katha Cabal, MD;  Location: [place] CV LAB;  Service: Cardiovascular;  Laterality: N/A;  . AGILE CAPSULE N/A 06/19/2016   Procedure: AGILE CAPSULE;  Surgeon: [name], MD;  Location: [facility] ENDOSCOPY;  Service: Endoscopy;  Laterality: N/A;  . COLONOSCOPY WITH PROPOFOL N/A 06/18/2016   Procedure: COLONOSCOPY WITH PROPOFOL;  Surgeon: [name], MD;  Location: [facility] ENDOSCOPY;  Service: Endoscopy;  Laterality: N/A;  . COLONOSCOPY WITH PROPOFOL N/A 08/12/2016   Procedure: COLONOSCOPY WITH PROPOFOL;  Surgeon: [name], MD;  Location: [facility] ENDOSCOPY;  Service: Endoscopy;  Laterality: N/A;  . COLONOSCOPY WITH PROPOFOL N/A 05/05/2017   Procedure: COLONOSCOPY WITH PROPOFOL;  Surgeon: Manya Silvas, MD;  Location: [facility] ENDOSCOPY;  Service: Endoscopy;  Laterality: N/A;  . CORONARY ANGIOPLASTY  ?   PT UNABLE TO TELL IF  BEFORE OR AFTER  CABG  . CORONARY ARTERY BYPASS GRAFT  2008  ([place] , [place])   3 VESSEL  . DIALYSIS FISTULA CREATION  LAST SURGERY  APPOX  2008  . ENTEROSCOPY N/A 05/10/2016   Procedure: ENTEROSCOPY;  Surgeon: Jerene Bears, MD;  Location: [place];  Service: Gastroenterology;  Laterality: N/A;  . ENTEROSCOPY N/A 08/12/2016   Procedure: ENTEROSCOPY;  Surgeon: [name], MD;  Location: [facility] ENDOSCOPY;  Service: Endoscopy;  Laterality: N/A;  . ENTEROSCOPY Left 06/03/2017   Procedure: ENTEROSCOPY;  Surgeon: Virgel Manifold, MD;  Location: [facility] ENDOSCOPY;  Service: Endoscopy;  Laterality: Left;  Procedure date will ultimately depend on when patient is medically optimized before the procedure, pending hemodialysis and blood transfusions etc. Will place on schedule and change depending on clinical status.   . ENTEROSCOPY N/A 06/05/2017   Procedure: ENTEROSCOPY;  Surgeon: Virgel Manifold, MD;  Location: [facility] ENDOSCOPY;  Service: Endoscopy;  Laterality: N/A;  . ENTEROSCOPY N/A 06/15/2017   Procedure: Push ENTEROSCOPY;  Surgeon: [name], MD;  Location: [facility] ENDOSCOPY;  Service: Endoscopy;  Laterality: N/A;  . ESOPHAGOGASTRODUODENOSCOPY N/A 05/07/2015   Procedure: ESOPHAGOGASTRODUODENOSCOPY (EGD);  Surgeon: Hulen Luster, MD;  Location: [facility] ENDOSCOPY;  Service: Endoscopy;  Laterality: N/A;  . ESOPHAGOGASTRODUODENOSCOPY (EGD) WITH PROPOFOL N/A 05/17/2015   Procedure: ESOPHAGOGASTRODUODENOSCOPY (EGD) WITH PROPOFOL;  Surgeon: [name], MD;  Location: [facility] ENDOSCOPY;  Service: Endoscopy;  Laterality: N/A;  . ESOPHAGOGASTRODUODENOSCOPY (EGD) WITH  PROPOFOL N/A 01/20/2016   Procedure: ESOPHAGOGASTRODUODENOSCOPY (EGD) WITH PROPOFOL;  Surgeon: [name], MD;  Location: [facility] ENDOSCOPY;  Service: Endoscopy;  Laterality: N/A;  . ESOPHAGOGASTRODUODENOSCOPY (EGD) WITH PROPOFOL N/A 04/17/2016   Procedure: ESOPHAGOGASTRODUODENOSCOPY (EGD) WITH PROPOFOL;  Surgeon: Lin Landsman, MD;  Location: [facility] ENDOSCOPY;  Service: Gastroenterology;  Laterality: N/A;  . ESOPHAGOGASTRODUODENOSCOPY (EGD) WITH PROPOFOL  05/09/2016   Procedure: ESOPHAGOGASTRODUODENOSCOPY (EGD) WITH PROPOFOL;  Surgeon: Jerene Bears, MD;  Location: [place];  Service: Endoscopy;;  . ESOPHAGOGASTRODUODENOSCOPY (EGD) WITH PROPOFOL N/A 06/16/2016   Procedure: ESOPHAGOGASTRODUODENOSCOPY (EGD) WITH PROPOFOL;   Surgeon: [name], MD;  Location: [facility] ENDOSCOPY;  Service: Endoscopy;  Laterality: N/A;  . ESOPHAGOGASTRODUODENOSCOPY (EGD) WITH PROPOFOL N/A 05/05/2017   Procedure: ESOPHAGOGASTRODUODENOSCOPY (EGD) WITH PROPOFOL;  Surgeon: Manya Silvas, MD;  Location: [facility] ENDOSCOPY;  Service: Endoscopy;  Laterality: N/A;  . ESOPHAGOGASTRODUODENOSCOPY (EGD) WITH PROPOFOL N/A 06/15/2017   Procedure: ESOPHAGOGASTRODUODENOSCOPY (EGD) WITH PROPOFOL;  Surgeon: [name], MD;  Location: [facility] ENDOSCOPY;  Service: Endoscopy;  Laterality: N/A;  . GIVENS CAPSULE STUDY N/A 05/07/2016   Procedure: GIVENS CAPSULE STUDY;  Surgeon: Doran Stabler, MD;  Location: [place];  Service: Endoscopy;  Laterality: N/A;  . MANDIBULAR HARDWARE REMOVAL N/A 07/29/2013   Procedure: REMOVAL OF ARCH BARS;  Surgeon: Theodoro Kos, DO;  Location: [place];  Service: Plastics;  Laterality: N/A;  . ORIF MANDIBULAR FRACTURE N/A 06/05/2013   Procedure: REPAIR OF MANDIBULAR FRACTURE x 2 with maxillo-mandibular fixation ;  Surgeon: Theodoro Kos, DO;  Location: [place];  Service: Plastics;  Laterality: N/A;  . PARACENTESIS    . PERIPHERAL ARTERIAL STENT GRAFT Left     Prior to Admission medications   Medication Sig Start Date End Date Taking? Authorizing Provider  budesonide-formoterol (SYMBICORT) 160-4.5 MCG/ACT inhaler Inhale 2 puffs into the lungs daily. 11/16/16   Vaughan Basta, MD  calcium acetate (PHOSLO) 667 MG capsule Take 2,001 mg by mouth 3 (three) times daily.    [provider]  folic acid (FOLVITE) 1 MG tablet Take 1 tablet (1 mg total) by mouth daily. 10/31/17   Fritzi Mandes, MD  gabapentin (NEURONTIN) 300 MG capsule Take 300 mg by mouth 3 (three) times daily.  08/16/16   [provider]  ipratropium-albuterol (DUONEB) 0.5-2.5 (3) MG/3ML SOLN Take 3 mLs by nebulization every 6 (six) hours as needed. Patient taking differently: Take 3 mLs by nebulization every 6 (six) hours as needed  (for shortness of breath/wheezing).  11/28/16   Loletha Grayer, MD  multivitamin (RENA-VIT) TABS tablet Take 1 tablet by mouth at bedtime. 10/31/17   Fritzi Mandes, MD  mupirocin ointment (BACTROBAN) 2 % Place 1 application into the nose 2 (two) times daily. 01/19/18   Vaughan Basta, MD  omeprazole (PRILOSEC) 40 MG capsule Take 40 mg by mouth daily. 09/08/17   [provider]  Spacer/Aero Chamber Mouthpiece MISC 1 Units by Does not apply route every 4 (four) hours as needed (wheezing). 02/19/17   Darel Hong, MD  tiotropium (SPIRIVA HANDIHALER) 18 MCG inhalation capsule Place 1 capsule (18 mcg total) into inhaler and inhale daily. 01/30/18 04/30/18  Demetrios Loll, MD    Allergies Patient has no known allergies.  Family History  Problem Relation Age of Onset  . Colon cancer Mother   . Cancer Father   . Cancer Sister   . Kidney disease Brother     Social History Social History   Tobacco Use  . Smoking status: Current Every Day Smoker  Packs/day: 0.15    Years: 40.00    Pack years: 6.00    Types: Cigarettes  . Smokeless tobacco: Never Used  Substance Use Topics  . Alcohol use: No    Comment: pt reports quitting after learning about cirrhosis  . Drug use: No    Frequency: 7.0 times per week    Types: Marijuana, Cocaine    Review of Systems  Constitutional: No fever/chills Eyes: No visual changes. ENT: No sore throat. Cardiovascular: Denies chest pain. Respiratory: As above Gastrointestinal: Abdominal distention.  No nausea, no vomiting.  No diarrhea.  No constipation. Genitourinary: Negative for dysuria. Musculoskeletal: Negative for back pain. Skin: Negative for rash. Neurological: Negative for headaches, focal weakness or numbness.   ____________________________________________   PHYSICAL EXAM:  VITAL SIGNS: ED Triage Vitals  Enc Vitals Group     BP 02/20/18 0031 (!) 139/92     Pulse Rate 02/20/18 0031 85     Resp 02/20/18 0031 (!) 28      Temp --      Temp src --      SpO2 02/20/18 0031 93 %     Weight 02/19/18 2212 170 lb (77.1 kg)     Height 02/19/18 2212 6\' 3"  (1.905 m)     Head Circumference --      Peak Flow --      Pain Score 02/19/18 2211 8     Pain Loc --      Pain Edu? --      Excl. in [place]? --     Constitutional: Alert and oriented.  in no acute distress. Eyes: Conjunctivae are normal.  Head: Atraumatic. Nose: No congestion/rhinnorhea. Mouth/Throat: Mucous membranes are moist.  Neck: No stridor.   Cardiovascular: Normal rate, regular rhythm. Grossly normal heart sounds.   Respiratory: Mild tachypnea.  Prolonged expiratory phase.  Diffuse wheezing throughout all fields.  Able to speak in full sentences. Gastrointestinal: Abdomen is borderline tense but nontender and consistent with previous ascites. Musculoskeletal: No lower extremity tenderness nor edema.  No joint effusions. Neurologic:  Normal speech and language. No gross focal neurologic deficits are appreciated. Skin:  Skin is warm, dry and intact. No rash noted. Psychiatric: Mood and affect are normal. Speech and behavior are normal.  ____________________________________________   LABS (all labs ordered are listed, but only abnormal results are displayed)  Labs Reviewed  TROPONIN I - Abnormal; Notable for the following components:      Result Value   Troponin I 0.07 (*)    All other components within normal limits  CBC WITH DIFFERENTIAL/PLATELET - Abnormal; Notable for the following components:   RBC 4.05 (*)    Hemoglobin 10.1 (*)    HCT 35.7 (*)    MCH 24.9 (*)    MCHC 28.3 (*)    RDW 20.1 (*)    Platelets 420 (*)    All other components within normal limits  COMPREHENSIVE METABOLIC PANEL - Abnormal; Notable for the following components:   Potassium 6.3 (*)    Glucose, Bld 130 (*)    BUN 56 (*)    Creatinine, Ser 14.25 (*)    Calcium 8.2 (*)    Albumin 3.1 (*)    GFR calc non Af Amer 3 (*)    GFR calc Af Amer 4 (*)    All other  components within normal limits  BRAIN NATRIURETIC PEPTIDE - Abnormal; Notable for the following components:   B Natriuretic Peptide 2,392.0 (*)    All other components within  normal limits   ____________________________________________  EKG  ED ECG REPORT I, Doran Stabler, the attending physician, personally viewed and interpreted this ECG.   Date: 02/20/2018  EKG Time: 2219  Rate: 91  Rhythm: normal sinus rhythm  Axis: Rightward axis  Intervals:Prolonged QT at 378  ST&T Change: T wave inversions in 2 3 and aVF.  No ST segment elevation or depression.  ____________________________________________  RADIOLOGY  Mild cardiomegaly without edema on the chest x-ray.  Streaky atelectasis at the bases bilaterally. ____________________________________________   PROCEDURES  Procedure(s) performed:   Procedures  Critical Care performed:   ____________________________________________   INITIAL IMPRESSION / ASSESSMENT AND PLAN / ED COURSE  Pertinent labs & imaging results that were available during my care of the patient were reviewed by me and considered in my medical decision making (see chart for details).  Differential includes, but is not limited to, viral syndrome, bronchitis including COPD exacerbation, pneumonia, reactive airway disease including asthma, CHF including exacerbation with or without pulmonary/interstitial edema, pneumothorax, ACS, thoracic trauma, and pulmonary embolism. As part of my medical decision making, I reviewed the following data within the electronic MEDICAL RECORD NUMBER Notes from prior ED visits  ----------------------------------------- 12:43 AM on 02/20/2018 -----------------------------------------  Patient to be admitted to the hospital.  Will be given treatment for what appears to be a COPD exacerbation.  Awaiting callback from renal at this time.  To be signed out to the hospitalist.  Patient aware of need for admission/dialysis.  Likely  hyperkalemia secondary to dialysis noncompliance.  ----------------------------------------- 1:59 AM on 02/20/2018 -----------------------------------------  At this point we have paged Dr. Juleen China, the nephrologist on-call 3 times.  We have not received a call back we have also paged Dr. [name] of the nephrology service without call back.  I have discussed the case with Dr. Duane Boston twice and she is resistant to admit the patient because she says that he just needs dialysis and this can be done from the emergency department.  At this point the patient continues to stay in the emergency department.  ----------------------------------------- 2:29 AM on 02/20/2018 -----------------------------------------  I was able to discuss the case with Dr. [name] who says that he will dialyze the patient first thing in the morning.  Patient to be temporized with [place] as well as medications for COPD. ____________________________________________   FINAL CLINICAL IMPRESSION(S) / ED DIAGNOSES  COPD exacerbation.  Ascites.  Hyperkalemia.  NEW MEDICATIONS STARTED DURING THIS VISIT:  New Prescriptions   No medications on file     Note:  This document was prepared using Dragon voice recognition software and may include unintentional dictation errors.     Orbie Pyo, MD 02/20/18 [phone]    Orbie Pyo, MD 02/20/18 [phone]

## 2018-02-20 NOTE — Progress Notes (Signed)
Post HD Tx    02/20/18 1230  Hand-Off documentation  Report given to (Full Name) Kenney Houseman, RN   Report received from (Full Name) Beatris Ship, RN   Vital Signs  Temp 98.1 F (36.7 C)  Temp Source Oral  Pulse Rate 84  Pulse Rate Source Monitor  Resp 15  BP 124/67  BP Location Left Arm  BP Method Automatic  Patient Position (if appropriate) Sitting  Oxygen Therapy  SpO2 100 %  O2 Device Nasal Cannula  O2 Flow Rate (L/min) 4 L/min  Pain Assessment  Pain Scale 0-10  Pain Score 0  Post-Hemodialysis Assessment  Rinseback Volume (mL) 250 mL  KECN 67.6 V  Dialyzer Clearance Lightly streaked  Duration of HD Treatment -hour(s) 3.5 hour(s)  Hemodialysis Intake (mL) 500 mL  UF Total -Machine (mL) 4591 mL  Net UF (mL) 4091 mL  Tolerated HD Treatment Yes  Hemodialysis Catheter Right Internal jugular Double-lumen  No Placement Date or Time found.   Placed prior to admission: Yes  Orientation: Right  Access Location: Internal jugular  Hemodialysis Catheter Type: Double-lumen  Catheter fill solution Heparin 1000 units/ml  Post treatment catheter status Capped and Clamped

## 2018-02-22 DIAGNOSIS — N186 End stage renal disease: Secondary | ICD-10-CM | POA: Diagnosis not present

## 2018-02-22 DIAGNOSIS — D509 Iron deficiency anemia, unspecified: Secondary | ICD-10-CM | POA: Diagnosis not present

## 2018-02-22 DIAGNOSIS — T782XXA Anaphylactic shock, unspecified, initial encounter: Secondary | ICD-10-CM | POA: Diagnosis not present

## 2018-02-22 DIAGNOSIS — Z992 Dependence on renal dialysis: Secondary | ICD-10-CM | POA: Diagnosis not present

## 2018-02-22 DIAGNOSIS — D631 Anemia in chronic kidney disease: Secondary | ICD-10-CM | POA: Diagnosis not present

## 2018-02-22 DIAGNOSIS — N2581 Secondary hyperparathyroidism of renal origin: Secondary | ICD-10-CM | POA: Diagnosis not present

## 2018-02-23 ENCOUNTER — Other Ambulatory Visit: Payer: Self-pay | Admitting: Nephrology

## 2018-02-23 DIAGNOSIS — R188 Other ascites: Secondary | ICD-10-CM

## 2018-02-24 DIAGNOSIS — D631 Anemia in chronic kidney disease: Secondary | ICD-10-CM | POA: Diagnosis not present

## 2018-02-24 DIAGNOSIS — T782XXA Anaphylactic shock, unspecified, initial encounter: Secondary | ICD-10-CM | POA: Diagnosis not present

## 2018-02-24 DIAGNOSIS — N186 End stage renal disease: Secondary | ICD-10-CM | POA: Diagnosis not present

## 2018-02-24 DIAGNOSIS — D509 Iron deficiency anemia, unspecified: Secondary | ICD-10-CM | POA: Diagnosis not present

## 2018-02-24 DIAGNOSIS — Z992 Dependence on renal dialysis: Secondary | ICD-10-CM | POA: Diagnosis not present

## 2018-02-24 DIAGNOSIS — N2581 Secondary hyperparathyroidism of renal origin: Secondary | ICD-10-CM | POA: Diagnosis not present

## 2018-02-28 ENCOUNTER — Ambulatory Visit
Admission: RE | Admit: 2018-02-28 | Discharge: 2018-02-28 | Disposition: A | Discharge status: Home / Self Care | Payer: Medicare Other | Source: Ambulatory Visit | Attending: Nephrology | Admitting: Nephrology

## 2018-02-28 DIAGNOSIS — R188 Other ascites: Secondary | ICD-10-CM | POA: Diagnosis not present

## 2018-02-28 DIAGNOSIS — K7031 Alcoholic cirrhosis of liver with ascites: Secondary | ICD-10-CM | POA: Diagnosis not present

## 2018-02-28 MED ORDER — ALBUMIN HUMAN 25 % IV SOLN
INTRAVENOUS | Status: AC
  Filled 2018-02-28: qty 100

## 2018-02-28 MED ORDER — ALBUMIN HUMAN 25 % IV SOLN
25.0000 g | Freq: Once | INTRAVENOUS | Status: AC
  Administered 2018-02-28: 25 g via INTRAVENOUS

## 2018-03-01 DIAGNOSIS — N186 End stage renal disease: Secondary | ICD-10-CM | POA: Diagnosis not present

## 2018-03-01 DIAGNOSIS — Z992 Dependence on renal dialysis: Secondary | ICD-10-CM | POA: Diagnosis not present

## 2018-03-01 DIAGNOSIS — T782XXA Anaphylactic shock, unspecified, initial encounter: Secondary | ICD-10-CM | POA: Diagnosis not present

## 2018-03-01 DIAGNOSIS — N2581 Secondary hyperparathyroidism of renal origin: Secondary | ICD-10-CM | POA: Diagnosis not present

## 2018-03-01 DIAGNOSIS — D631 Anemia in chronic kidney disease: Secondary | ICD-10-CM | POA: Diagnosis not present

## 2018-03-01 DIAGNOSIS — D509 Iron deficiency anemia, unspecified: Secondary | ICD-10-CM | POA: Diagnosis not present

## 2018-03-03 DIAGNOSIS — N2581 Secondary hyperparathyroidism of renal origin: Secondary | ICD-10-CM | POA: Diagnosis not present

## 2018-03-03 DIAGNOSIS — Z992 Dependence on renal dialysis: Secondary | ICD-10-CM | POA: Diagnosis not present

## 2018-03-03 DIAGNOSIS — D631 Anemia in chronic kidney disease: Secondary | ICD-10-CM | POA: Diagnosis not present

## 2018-03-03 DIAGNOSIS — N186 End stage renal disease: Secondary | ICD-10-CM | POA: Diagnosis not present

## 2018-03-03 DIAGNOSIS — D509 Iron deficiency anemia, unspecified: Secondary | ICD-10-CM | POA: Diagnosis not present

## 2018-03-03 DIAGNOSIS — T782XXA Anaphylactic shock, unspecified, initial encounter: Secondary | ICD-10-CM | POA: Diagnosis not present

## 2018-03-04 ENCOUNTER — Other Ambulatory Visit: Payer: Self-pay

## 2018-03-04 ENCOUNTER — Emergency Department
Admission: EM | Admit: 2018-03-04 | Discharge: 2018-03-04 | Disposition: A | Discharge status: Home / Self Care | Payer: Medicare Other | Attending: Emergency Medicine | Admitting: Emergency Medicine

## 2018-03-04 ENCOUNTER — Emergency Department: Payer: Medicare Other

## 2018-03-04 DIAGNOSIS — Z992 Dependence on renal dialysis: Secondary | ICD-10-CM | POA: Diagnosis not present

## 2018-03-04 DIAGNOSIS — F17210 Nicotine dependence, cigarettes, uncomplicated: Secondary | ICD-10-CM | POA: Insufficient documentation

## 2018-03-04 DIAGNOSIS — Z955 Presence of coronary angioplasty implant and graft: Secondary | ICD-10-CM | POA: Insufficient documentation

## 2018-03-04 DIAGNOSIS — Z951 Presence of aortocoronary bypass graft: Secondary | ICD-10-CM | POA: Diagnosis not present

## 2018-03-04 DIAGNOSIS — R0600 Dyspnea, unspecified: Secondary | ICD-10-CM | POA: Diagnosis not present

## 2018-03-04 DIAGNOSIS — N186 End stage renal disease: Secondary | ICD-10-CM | POA: Insufficient documentation

## 2018-03-04 DIAGNOSIS — I2510 Atherosclerotic heart disease of native coronary artery without angina pectoris: Secondary | ICD-10-CM | POA: Diagnosis not present

## 2018-03-04 DIAGNOSIS — Z79899 Other long term (current) drug therapy: Secondary | ICD-10-CM | POA: Diagnosis not present

## 2018-03-04 DIAGNOSIS — R140 Abdominal distension (gaseous): Secondary | ICD-10-CM | POA: Diagnosis not present

## 2018-03-04 DIAGNOSIS — J449 Chronic obstructive pulmonary disease, unspecified: Secondary | ICD-10-CM | POA: Diagnosis not present

## 2018-03-04 DIAGNOSIS — R05 Cough: Secondary | ICD-10-CM | POA: Diagnosis not present

## 2018-03-04 DIAGNOSIS — R0602 Shortness of breath: Secondary | ICD-10-CM | POA: Insufficient documentation

## 2018-03-04 DIAGNOSIS — I509 Heart failure, unspecified: Secondary | ICD-10-CM | POA: Diagnosis not present

## 2018-03-04 DIAGNOSIS — I132 Hypertensive heart and chronic kidney disease with heart failure and with stage 5 chronic kidney disease, or end stage renal disease: Secondary | ICD-10-CM | POA: Diagnosis not present

## 2018-03-04 DIAGNOSIS — R0902 Hypoxemia: Secondary | ICD-10-CM | POA: Diagnosis not present

## 2018-03-04 LAB — COMPREHENSIVE METABOLIC PANEL
ALK PHOS: 145 U/L — AB (ref 38–126)
ALT: 13 U/L (ref 0–44)
AST: 23 U/L (ref 15–41)
Albumin: 3.2 g/dL — ABNORMAL LOW (ref 3.5–5.0)
Anion gap: 13 (ref 5–15)
BUN: 33 mg/dL — ABNORMAL HIGH (ref 6–20)
CALCIUM: 9.5 mg/dL (ref 8.9–10.3)
CO2: 29 mmol/L (ref 22–32)
Chloride: 96 mmol/L — ABNORMAL LOW (ref 98–111)
Creatinine, Ser: 7.72 mg/dL — ABNORMAL HIGH (ref 0.61–1.24)
GFR calc Af Amer: 8 mL/min — ABNORMAL LOW (ref >60)
GFR calc non Af Amer: 7 mL/min — ABNORMAL LOW (ref >60)
Glucose, Bld: 101 mg/dL — ABNORMAL HIGH (ref 70–99)
Potassium: 5.1 mmol/L (ref 3.5–5.1)
Sodium: 138 mmol/L (ref 135–145)
Total Bilirubin: 0.6 mg/dL (ref 0.3–1.2)
Total Protein: 7.2 g/dL (ref 6.5–8.1)

## 2018-03-04 LAB — CBC
HCT: 36.4 % — ABNORMAL LOW (ref 39.0–52.0)
Hemoglobin: 10.2 g/dL — ABNORMAL LOW (ref 13.0–17.0)
MCH: 24.4 pg — ABNORMAL LOW (ref 26.0–34.0)
MCHC: 28 g/dL — ABNORMAL LOW (ref 30.0–36.0)
MCV: 87.1 fL (ref 80.0–100.0)
Platelets: 236 10*3/uL (ref 150–400)
RBC: 4.18 MIL/uL — AB (ref 4.22–5.81)
RDW: 20.3 % — ABNORMAL HIGH (ref 11.5–15.5)
WBC: 10.7 10*3/uL — ABNORMAL HIGH (ref 4.0–10.5)
nRBC: 0 % (ref 0.0–0.2)

## 2018-03-04 LAB — TROPONIN I: TROPONIN I: 0.06 ng/mL — AB (ref <0.03)

## 2018-03-04 MED ORDER — HYDROCOD POLST-CPM POLST ER 10-8 MG/5ML PO SUER
5.0000 mL | Freq: Once | ORAL | Status: AC
  Administered 2018-03-04: 5 mL via ORAL
  Filled 2018-03-04: qty 5

## 2018-03-04 MED ORDER — ALUM & MAG HYDROXIDE-SIMETH 200-200-20 MG/5ML PO SUSP
30.0000 mL | Freq: Once | ORAL | Status: AC
  Administered 2018-03-04: 30 mL via ORAL
  Filled 2018-03-04: qty 30

## 2018-03-04 NOTE — ED Notes (Signed)
Patient transported to X-ray 

## 2018-03-04 NOTE — ED Provider Notes (Signed)
[facility] Emergency Department Provider Note  Time seen: 5:27 PM  I have reviewed the triage vital signs and the nursing notes.   HISTORY  Chief Complaint Shortness of Breath    HPI Stanley [name] is a 57 y.o. male with a past medical history of alcohol abuse, cirrhosis, CHF, end-stage renal disease on hemodialysis Tuesday/Thursday/Saturday, hypertension, hyperlipidemia, presents to the emergency department for cough and shortness of breath.  According to the patient 2 weeks ago he was diagnosed with pneumonia.  States he finished his antibiotics but continues to feel short of breath with a continued cough.  States occasional sputum production.  Denies any known fever.  Denies any vomiting or diarrhea.  Patient states he went to his dialysis on Saturday and received a full session.   Past Medical History:  Diagnosis Date  . Alcohol abuse   . CHF (congestive heart failure) ([place])   . Cirrhosis ([place])   . Coronary artery disease 2009  . Drug abuse ([place])   . End stage renal disease on dialysis [facility]) NEPHROLOGIST-   DR [facility]  IN [place]   HEMODIALYSIS --   TUES/  THURS/  SAT  . Gastrointestinal bleed 06/13/2017   From chart...hx of multiple GI bleeds  . GERD (gastroesophageal reflux disease)   . Hyperlipidemia   . Hypertension   . PAD (peripheral artery disease) ([place])   . Renal insufficiency    Per pt, 32 oz fluid restriction per day  . S/P triple vessel bypass 06/09/2016   2009ish  . Suicidal ideation    & HOMICIDAL IDEATION --  06-16-2013   ADMITTED TO BEHAVIOR HEALTH    Patient Active Problem List   Diagnosis Date Noted  . Healthcare-associated pneumonia 01/15/2018  . Acute on chronic respiratory failure ([place]) 01/15/2018  . Acute pulmonary edema ([place]) 12/23/2017  . Acute respiratory failure ([place]) 10/29/2017  . ESRD (end stage renal disease) on dialysis ([place]) 07/28/2017  . Protein-calorie malnutrition, severe 06/14/2017  . Encounter for  dialysis [facility])   . Palliative care by specialist   . Goals of care, counseling/discussion   . Malnutrition of moderate degree 06/05/2017  . Secondary esophageal varices without bleeding ([place])   . Stomach irritation   . Idiopathic esophageal varices without bleeding ([place])   . Alcoholic hepatitis with ascites 05/24/2017  . ESRD (end stage renal disease) ([place]) 04/28/2017  . Uremia 03/08/2017  . ESRD on hemodialysis ([place]) 03/03/2017  . Weakness 02/28/2017  . Hypocalcemia 02/22/2017  . Shortness of breath 11/26/2016  . COPD (chronic obstructive pulmonary disease) ([place]) 10/30/2016  . COPD exacerbation ([place]) 10/29/2016  . Anemia   . Heme positive stool   . Ulceration of intestine   . Benign neoplasm of transverse colon   . Acute gastrointestinal hemorrhage   . Esophageal candidiasis ([place])   . Angiodysplasia of intestinal tract   . Acute respiratory failure with hypoxia ([place]) 07/03/2016  . GI bleeding 06/24/2016  . Rectal bleeding 06/14/2016  . Anemia of chronic disease 06/01/2016  . MRSA carrier 06/01/2016  . Chronic renal failure 05/23/2016  . Ischemic heart disease 05/23/2016  . Angiodysplasia of small intestine   . Melena   . Small bowel bleed not requiring more than 4 units of blood in 24 hours, ICU, or surgery   . Anemia due to chronic blood loss   . Abdominal pain 05/05/2016  . Acute posthemorrhagic anemia 04/17/2016  . Gastrointestinal bleed 04/17/2016  . History of esophagogastroduodenoscopy (EGD) 04/17/2016  . Elevated troponin  04/17/2016  . Alcohol abuse 04/17/2016  . Upper GI bleed 01/19/2016  . Blood in stool   . Angiodysplasia of stomach and duodenum with hemorrhage   . Gastritis   . Reflux esophagitis   . GI bleed 05/16/2015  . Acute GI bleeding   . Symptomatic anemia 04/30/2015  . HTN (hypertension) 04/06/2015  . GERD (gastroesophageal reflux disease) 04/06/2015  . HLD (hyperlipidemia) 04/06/2015  . Dyspnea 04/06/2015  . Cirrhosis of liver with ascites  ([place]) 04/06/2015  . Ascites 04/06/2015  . GIB (gastrointestinal bleeding) 03/23/2015  . Homicidal ideation 06/19/2013  . Suicidal intent 06/19/2013  . Homicidal ideations 06/19/2013  . Hyperkalemia 06/16/2013  . Mandible fracture ([place]) 06/05/2013  . Fracture, mandible ([place]) 06/02/2013  . Coronary atherosclerosis of native coronary artery 06/02/2013  . ESRD on dialysis ([place]) 06/02/2013  . Mandible open fracture ([place]) 06/02/2013    Past Surgical History:  Procedure Laterality Date  . A/V FISTULAGRAM Right 06/06/2017   Procedure: A/V FISTULAGRAM;  Surgeon: Katha Cabal, MD;  Location: [place] [facility];  Service: Cardiovascular;  Laterality: Right;  . A/V SHUNT INTERVENTION N/A 06/06/2017   Procedure: A/V SHUNT INTERVENTION;  Surgeon: Katha Cabal, MD;  Location: [place] [facility];  Service: Cardiovascular;  Laterality: N/A;  . AGILE CAPSULE N/A 06/19/2016   Procedure: AGILE CAPSULE;  Surgeon: [name], MD;  Location: [facility] ENDOSCOPY;  Service: Endoscopy;  Laterality: N/A;  . COLONOSCOPY WITH PROPOFOL N/A 06/18/2016   Procedure: COLONOSCOPY WITH PROPOFOL;  Surgeon: [name], MD;  Location: [facility] ENDOSCOPY;  Service: Endoscopy;  Laterality: N/A;  . COLONOSCOPY WITH PROPOFOL N/A 08/12/2016   Procedure: COLONOSCOPY WITH PROPOFOL;  Surgeon: [name], MD;  Location: [facility] ENDOSCOPY;  Service: Endoscopy;  Laterality: N/A;  . COLONOSCOPY WITH PROPOFOL N/A 05/05/2017   Procedure: COLONOSCOPY WITH PROPOFOL;  Surgeon: Manya Silvas, MD;  Location: [facility] ENDOSCOPY;  Service: Endoscopy;  Laterality: N/A;  . CORONARY ANGIOPLASTY  ?   PT UNABLE TO TELL IF  BEFORE OR AFTER  CABG  . CORONARY ARTERY BYPASS GRAFT  2008  ([place])   3 VESSEL  . DIALYSIS FISTULA CREATION  LAST SURGERY  APPOX  2008  . ENTEROSCOPY N/A 05/10/2016   Procedure: ENTEROSCOPY;  Surgeon: Jerene Bears, MD;  Location: [place];  Service: Gastroenterology;  Laterality: N/A;  . ENTEROSCOPY N/A 08/12/2016    Procedure: ENTEROSCOPY;  Surgeon: [name], MD;  Location: [facility] ENDOSCOPY;  Service: Endoscopy;  Laterality: N/A;  . ENTEROSCOPY Left 06/03/2017   Procedure: ENTEROSCOPY;  Surgeon: Virgel Manifold, MD;  Location: [facility] ENDOSCOPY;  Service: Endoscopy;  Laterality: Left;  Procedure date will ultimately depend on when patient is medically optimized before the procedure, pending hemodialysis and blood transfusions etc. Will place on schedule and change depending on clinical status.   . ENTEROSCOPY N/A 06/05/2017   Procedure: ENTEROSCOPY;  Surgeon: Virgel Manifold, MD;  Location: [facility] ENDOSCOPY;  Service: Endoscopy;  Laterality: N/A;  . ENTEROSCOPY N/A 06/15/2017   Procedure: Push ENTEROSCOPY;  Surgeon: [name], MD;  [facility]COPY;  Service: Endoscopy;  Laterality: N/A;  . ESOPHAGOGASTRODUODENOSCOPY N/A 05/07/2015   Procedure: ESOPHAGOGASTRODUODENOSCOPY (EGD);  Surgeon: Hulen Luster, MD;  Location: [facility] ENDOSCOPY;  Service: Endoscopy;  Laterality: N/A;  . ESOPHAGOGASTRODUODENOSCOPY (EGD) WITH PROPOFOL N/A 05/17/2015   Procedure: ESOPHAGOGASTRODUODENOSCOPY (EGD) WITH PROPOFOL;  Surgeon: [name], MD;  Location: [facility] ENDOSCOPY;  Service: Endoscopy;  Laterality: N/A;  . ESOPHAGOGASTRODUODENOSCOPY (EGD) WITH PROPOFOL N/A 01/20/2016   Procedure:  ESOPHAGOGASTRODUODENOSCOPY (EGD) WITH PROPOFOL;  Surgeon: [name], MD;  Location: [facility] ENDOSCOPY;  Service: Endoscopy;  Laterality: N/A;  . ESOPHAGOGASTRODUODENOSCOPY (EGD) WITH PROPOFOL N/A 04/17/2016   Procedure: ESOPHAGOGASTRODUODENOSCOPY (EGD) WITH PROPOFOL;  Surgeon: Lin Landsman, MD;  Location: [facility] ENDOSCOPY;  Service: Gastroenterology;  Laterality: N/A;  . ESOPHAGOGASTRODUODENOSCOPY (EGD) WITH PROPOFOL  05/09/2016   Procedure: ESOPHAGOGASTRODUODENOSCOPY (EGD) WITH PROPOFOL;  Surgeon: Jerene Bears, MD;  Location: [place];  Service: Endoscopy;;  . ESOPHAGOGASTRODUODENOSCOPY (EGD) WITH PROPOFOL N/A 06/16/2016   Procedure:  ESOPHAGOGASTRODUODENOSCOPY (EGD) WITH PROPOFOL;  Surgeon: [name], MD;  Location: [facility] ENDOSCOPY;  Service: Endoscopy;  Laterality: N/A;  . ESOPHAGOGASTRODUODENOSCOPY (EGD) WITH PROPOFOL N/A 05/05/2017   Procedure: ESOPHAGOGASTRODUODENOSCOPY (EGD) WITH PROPOFOL;  Surgeon: Manya Silvas, MD;  Location: [facility] ENDOSCOPY;  Service: Endoscopy;  Laterality: N/A;  . ESOPHAGOGASTRODUODENOSCOPY (EGD) WITH PROPOFOL N/A 06/15/2017   Procedure: ESOPHAGOGASTRODUODENOSCOPY (EGD) WITH PROPOFOL;  Surgeon: [name], MD;  Location: [facility] ENDOSCOPY;  Service: Endoscopy;  Laterality: N/A;  . GIVENS CAPSULE STUDY N/A 05/07/2016   Procedure: GIVENS CAPSULE STUDY;  Surgeon: Doran Stabler, MD;  Location: [place];  Service: Endoscopy;  Laterality: N/A;  . MANDIBULAR HARDWARE REMOVAL N/A 07/29/2013   Procedure: REMOVAL OF ARCH BARS;  Surgeon: Theodoro Kos, DO;  Location: [place];  Service: Plastics;  Laterality: N/A;  . ORIF MANDIBULAR FRACTURE N/A 06/05/2013   Procedure: REPAIR OF MANDIBULAR FRACTURE x 2 with maxillo-mandibular fixation ;  Surgeon: Theodoro Kos, DO;  Location: [place];  Service: Plastics;  Laterality: N/A;  . PARACENTESIS    . PERIPHERAL ARTERIAL STENT GRAFT Left     Prior to Admission medications   Medication Sig Start Date End Date Taking? Authorizing Provider  budesonide-formoterol (SYMBICORT) 160-4.5 MCG/ACT inhaler Inhale 2 puffs into the lungs daily. Patient not taking: Reported on 02/20/2018 11/16/16   Vaughan Basta, MD  calcium acetate (PHOSLO) 667 MG capsule Take 2,001 mg by mouth 3 (three) times daily.    [provider]  folic acid (FOLVITE) 1 MG tablet Take 1 tablet (1 mg total) by mouth daily. Patient not taking: Reported on 02/20/2018 10/31/17   Fritzi Mandes, MD  gabapentin (NEURONTIN) 300 MG capsule Take 300 mg by mouth 3 (three) times daily.  08/16/16   [provider]  ipratropium-albuterol (DUONEB) 0.5-2.5 (3) MG/3ML SOLN Take 3  mLs by nebulization every 6 (six) hours as needed. Patient not taking: Reported on 02/20/2018 11/28/16   Loletha Grayer, MD  multivitamin (RENA-VIT) TABS tablet Take 1 tablet by mouth at bedtime. 10/31/17   Fritzi Mandes, MD  mupirocin ointment (BACTROBAN) 2 % Place 1 application into the nose 2 (two) times daily. Patient not taking: Reported on 02/20/2018 01/19/18   Vaughan Basta, MD  omeprazole (PRILOSEC) 40 MG capsule Take 40 mg by mouth daily. 09/08/17   [provider]  Spacer/Aero Chamber Mouthpiece MISC 1 Units by Does not apply route every 4 (four) hours as needed (wheezing). Patient not taking: Reported on 02/20/2018 02/19/17   Darel Hong, MD  tiotropium (SPIRIVA HANDIHALER) 18 MCG inhalation capsule Place 1 capsule (18 mcg total) into inhaler and inhale daily. Patient not taking: Reported on 02/20/2018 01/30/18 04/30/18  Demetrios Loll, MD    No Known Allergies  Family History  Problem Relation Age of Onset  . Colon cancer Mother   . Cancer Father   . Cancer Sister   . Kidney disease Brother     Social History Social History   Tobacco Use  .  Smoking status: Current Every Day Smoker    Packs/day: 0.15    Years: 40.00    Pack years: 6.00    Types: Cigarettes  . Smokeless tobacco: Never Used  Substance Use Topics  . Alcohol use: No    Comment: pt reports quitting after learning about cirrhosis  . Drug use: No    Frequency: 7.0 times per week    Types: Marijuana, Cocaine    Review of Systems Constitutional: Negative for fever. Cardiovascular: Negative for chest pain. Respiratory: Negative for shortness of breath. Gastrointestinal: Negative for abdominal pain, vomiting Genitourinary: Negative for urinary compaints Musculoskeletal: Negative for musculoskeletal complaints Skin: Negative for skin complaints  Neurological: Negative for headache All other ROS negative  ____________________________________________   PHYSICAL EXAM:  VITAL SIGNS: ED  Triage Vitals  Enc Vitals Group     BP 03/04/18 1700 (!) 138/96     Pulse Rate 03/04/18 1700 94     Resp 03/04/18 1700 (!) 22     Temp 03/04/18 1700 98.4 F (36.9 C)     Temp Source 03/04/18 1700 Oral     SpO2 03/04/18 1700 95 %     Weight 03/04/18 1701 169 lb 15.6 oz (77.1 kg)     Height 03/04/18 1701 6\' 3"  (1.905 m)     Head Circumference --      Peak Flow --      Pain Score 03/04/18 1701 0     Pain Loc --      Pain Edu? --      Excl. in [place]? --    Constitutional: Alert and oriented. Well appearing and in no distress. Eyes: Normal exam ENT   Head: Normocephalic and atraumatic.   Mouth/Throat: Mucous membranes are moist. Cardiovascular: Normal rate, regular rhythm. No murmur Respiratory: Normal respiratory effort without tachypnea nor retractions. Breath sounds are clear.  Occasional cough during exam. Gastrointestinal: Soft and nontender. No distention.   Musculoskeletal: Nontender with normal range of motion in all extremities. Neurologic:  Normal speech and language. No gross focal neurologic deficits  Skin:  Skin is warm, dry and intact.  Psychiatric: Mood and affect are normal.   ____________________________________________    EKG  EKG viewed and interpreted by myself shows a normal sinus rhythm at 94 bpm with a slightly widened QRS, right axis deviation, largely normal intervals with nonspecific ST changes.  ____________________________________________    RADIOLOGY  Chest x-ray shows cardiomegaly without frank edema.  ____________________________________________   INITIAL IMPRESSION / ASSESSMENT AND PLAN / ED COURSE  Pertinent labs & imaging results that were available during my care of the patient were reviewed by me and considered in my medical decision making (see chart for details).  Patient presents to the emergency department for cough and shortness of breath ongoing x2 weeks.  Differential would include pneumonia, pneumothorax, ACS, fluid  overload.  We will check labs, chest x-ray, EKG and continue to closely monitor.  Overall the patient appears well, lying in bed no distress watching TV.  Patient's labs are largely at baseline.  Mild elevation in troponin however this is largely baseline for the patient.  Chest x-ray is clear.  Patient eloped from the emergency department prior to me informing him of his results.  I spoke to registration, they state that the patient told her he was tired of waiting in the emergency department and just wanted to go home.  ____________________________________________   FINAL CLINICAL IMPRESSION(S) / ED DIAGNOSES  Cough Shortness of breath    Dian Minahan,  Lennette Bihari, MD 03/04/18 2001

## 2018-03-04 NOTE — ED Notes (Signed)
Pt called desk to say that he was going to leave- RN unable to assess situation at this time d/t being in the middle of a septic work up.

## 2018-03-04 NOTE — ED Triage Notes (Signed)
Pt to ed via aems with c/o shortness of breath. Pt had pneumonia 2 weeks ago and never got any better with antibiotics. Pt was 91% on room air when ems so pt was placed on 2 L and is now 94%. VSS

## 2018-03-08 ENCOUNTER — Ambulatory Visit
Admission: RE | Admit: 2018-03-08 | Discharge: 2018-03-08 | Disposition: A | Discharge status: Home / Self Care | Payer: Medicare Other | Source: Ambulatory Visit | Attending: Nephrology | Admitting: Nephrology

## 2018-03-08 DIAGNOSIS — R188 Other ascites: Secondary | ICD-10-CM | POA: Diagnosis not present

## 2018-03-08 MED ORDER — ALBUMIN HUMAN 25 % IV SOLN
25.0000 g | Freq: Once | INTRAVENOUS | Status: AC
  Administered 2018-03-08: 25 g via INTRAVENOUS

## 2018-03-08 MED ORDER — ALBUMIN HUMAN 25 % IV SOLN
INTRAVENOUS | Status: AC
  Filled 2018-03-08: qty 100

## 2018-03-10 DIAGNOSIS — N186 End stage renal disease: Secondary | ICD-10-CM | POA: Diagnosis not present

## 2018-03-10 DIAGNOSIS — N2581 Secondary hyperparathyroidism of renal origin: Secondary | ICD-10-CM | POA: Diagnosis not present

## 2018-03-10 DIAGNOSIS — D509 Iron deficiency anemia, unspecified: Secondary | ICD-10-CM | POA: Diagnosis not present

## 2018-03-10 DIAGNOSIS — Z992 Dependence on renal dialysis: Secondary | ICD-10-CM | POA: Diagnosis not present

## 2018-03-10 DIAGNOSIS — D631 Anemia in chronic kidney disease: Secondary | ICD-10-CM | POA: Diagnosis not present

## 2018-03-10 DIAGNOSIS — T782XXA Anaphylactic shock, unspecified, initial encounter: Secondary | ICD-10-CM | POA: Diagnosis not present

## 2018-03-13 ENCOUNTER — Encounter: Payer: Self-pay | Admitting: Emergency Medicine

## 2018-03-13 ENCOUNTER — Other Ambulatory Visit: Payer: Self-pay

## 2018-03-13 ENCOUNTER — Emergency Department
Admission: EM | Admit: 2018-03-13 | Discharge: 2018-03-13 | Disposition: A | Discharge status: Home / Self Care | Payer: Medicare Other | Attending: Emergency Medicine | Admitting: Emergency Medicine

## 2018-03-13 ENCOUNTER — Emergency Department: Payer: Medicare Other

## 2018-03-13 ENCOUNTER — Other Ambulatory Visit: Payer: Self-pay | Admitting: Nephrology

## 2018-03-13 DIAGNOSIS — Z992 Dependence on renal dialysis: Secondary | ICD-10-CM | POA: Diagnosis not present

## 2018-03-13 DIAGNOSIS — Z951 Presence of aortocoronary bypass graft: Secondary | ICD-10-CM | POA: Insufficient documentation

## 2018-03-13 DIAGNOSIS — Z79899 Other long term (current) drug therapy: Secondary | ICD-10-CM | POA: Diagnosis not present

## 2018-03-13 DIAGNOSIS — D631 Anemia in chronic kidney disease: Secondary | ICD-10-CM | POA: Diagnosis not present

## 2018-03-13 DIAGNOSIS — R05 Cough: Secondary | ICD-10-CM | POA: Insufficient documentation

## 2018-03-13 DIAGNOSIS — I132 Hypertensive heart and chronic kidney disease with heart failure and with stage 5 chronic kidney disease, or end stage renal disease: Secondary | ICD-10-CM | POA: Insufficient documentation

## 2018-03-13 DIAGNOSIS — I509 Heart failure, unspecified: Secondary | ICD-10-CM | POA: Insufficient documentation

## 2018-03-13 DIAGNOSIS — F17210 Nicotine dependence, cigarettes, uncomplicated: Secondary | ICD-10-CM | POA: Diagnosis not present

## 2018-03-13 DIAGNOSIS — R0902 Hypoxemia: Secondary | ICD-10-CM | POA: Diagnosis not present

## 2018-03-13 DIAGNOSIS — R188 Other ascites: Secondary | ICD-10-CM

## 2018-03-13 DIAGNOSIS — J449 Chronic obstructive pulmonary disease, unspecified: Secondary | ICD-10-CM | POA: Diagnosis not present

## 2018-03-13 DIAGNOSIS — R0602 Shortness of breath: Secondary | ICD-10-CM | POA: Diagnosis not present

## 2018-03-13 DIAGNOSIS — I120 Hypertensive chronic kidney disease with stage 5 chronic kidney disease or end stage renal disease: Secondary | ICD-10-CM | POA: Diagnosis not present

## 2018-03-13 DIAGNOSIS — N186 End stage renal disease: Secondary | ICD-10-CM | POA: Diagnosis not present

## 2018-03-13 DIAGNOSIS — E875 Hyperkalemia: Secondary | ICD-10-CM | POA: Diagnosis not present

## 2018-03-13 LAB — CBC WITH DIFFERENTIAL/PLATELET
Abs Immature Granulocytes: 0.04 10*3/uL (ref 0.00–0.07)
BASOS ABS: 0.1 10*3/uL (ref 0.0–0.1)
Basophils Relative: 1 %
Eosinophils Absolute: 0.1 10*3/uL (ref 0.0–0.5)
Eosinophils Relative: 2 %
HCT: 39.9 % (ref 39.0–52.0)
Hemoglobin: 11.4 g/dL — ABNORMAL LOW (ref 13.0–17.0)
Immature Granulocytes: 1 %
Lymphocytes Relative: 12 %
Lymphs Abs: 0.8 10*3/uL (ref 0.7–4.0)
MCH: 24.8 pg — ABNORMAL LOW (ref 26.0–34.0)
MCHC: 28.6 g/dL — ABNORMAL LOW (ref 30.0–36.0)
MCV: 86.9 fL (ref 80.0–100.0)
Monocytes Absolute: 1 10*3/uL (ref 0.1–1.0)
Monocytes Relative: 14 %
NEUTROS ABS: 5 10*3/uL (ref 1.7–7.7)
Neutrophils Relative %: 70 %
PLATELETS: 373 10*3/uL (ref 150–400)
RBC: 4.59 MIL/uL (ref 4.22–5.81)
RDW: 20.5 % — ABNORMAL HIGH (ref 11.5–15.5)
WBC: 7 10*3/uL (ref 4.0–10.5)
nRBC: 0 % (ref 0.0–0.2)

## 2018-03-13 LAB — BASIC METABOLIC PANEL
Anion gap: 13 (ref 5–15)
BUN: 43 mg/dL — ABNORMAL HIGH (ref 6–20)
CO2: 34 mmol/L — ABNORMAL HIGH (ref 22–32)
Calcium: 10.3 mg/dL (ref 8.9–10.3)
Chloride: 93 mmol/L — ABNORMAL LOW (ref 98–111)
Creatinine, Ser: 11.65 mg/dL — ABNORMAL HIGH (ref 0.61–1.24)
GFR calc non Af Amer: 4 mL/min — ABNORMAL LOW (ref >60)
GFR, EST AFRICAN AMERICAN: 5 mL/min — AB (ref >60)
Glucose, Bld: 95 mg/dL (ref 70–99)
Potassium: 5.4 mmol/L — ABNORMAL HIGH (ref 3.5–5.1)
Sodium: 140 mmol/L (ref 135–145)

## 2018-03-13 LAB — TROPONIN I: Troponin I: 0.07 ng/mL (ref <0.03)

## 2018-03-13 LAB — BRAIN NATRIURETIC PEPTIDE: B Natriuretic Peptide: 4060 pg/mL — ABNORMAL HIGH (ref 0.0–100.0)

## 2018-03-13 MED ORDER — ACETAMINOPHEN 325 MG PO TABS
650.0000 mg | ORAL_TABLET | Freq: Four times a day (QID) | ORAL | Status: DC | PRN
  Administered 2018-03-13: 650 mg via ORAL

## 2018-03-13 MED ORDER — ALBUTEROL SULFATE (2.5 MG/3ML) 0.083% IN NEBU
2.5000 mg | INHALATION_SOLUTION | Freq: Once | RESPIRATORY_TRACT | Status: AC
  Administered 2018-03-13: 2.5 mg via RESPIRATORY_TRACT
  Filled 2018-03-13: qty 3

## 2018-03-13 MED ORDER — CHLORHEXIDINE GLUCONATE CLOTH 2 % EX PADS
6.0000 | MEDICATED_PAD | Freq: Every day | CUTANEOUS | Status: DC
  Filled 2018-03-13: qty 6

## 2018-03-13 MED ORDER — GUAIFENESIN 100 MG/5ML PO SOLN
5.0000 mL | ORAL | Status: DC | PRN
  Administered 2018-03-13: 100 mg via ORAL
  Filled 2018-03-13 (×4): qty 5

## 2018-03-13 NOTE — Progress Notes (Signed)
Hd completed 

## 2018-03-13 NOTE — ED Triage Notes (Signed)
Pt presents to ED with c/o cough and SOB. Pt states last dialysis was 2 weeks ago, has been unable to get his dialysis. Pt able to speak in complete sentences on arrival to ED RA O2 94% at this time.

## 2018-03-13 NOTE — Progress Notes (Signed)
Patient refuses to wait for transport and has walked to ED against nurses advise.

## 2018-03-13 NOTE — ED Notes (Signed)
Pt placed on 2L via [place] at this time due to saturations 88% on RA while sleeping.

## 2018-03-13 NOTE — Progress Notes (Signed)
Hd started  

## 2018-03-13 NOTE — ED Notes (Signed)
Pt left dialysis before ED RN was able to provide transport. Delay in charting discharge.

## 2018-03-13 NOTE — ED Notes (Signed)
Date and time results received: 03/13/18 0930 (use smartphrase ".now" to insert current time)  Test: Trop Critical Value: 0.07  Name of Provider Notified: Dr. Cherylann Banas  Orders Received? Or Actions Taken?: Critical results acknowledged

## 2018-03-13 NOTE — ED Notes (Signed)
Pt alert and calm. Leaving for dialysis now.

## 2018-03-13 NOTE — Progress Notes (Signed)
[facility]    Subjective:   [name] missed dialysis on Saturday due to transportation. Brought to the ED with shortness of breath.   Objective:  Vital signs in last 24 hours:  Temp:  [98 F (36.7 C)] 98 F (36.7 C) (12/31 0830) Pulse Rate:  [80-88] 88 (12/31 1123) Resp:  [18-22] 22 (12/31 1123) BP: (121-122)/(75-84) 122/75 (12/31 1100) SpO2:  [90 %-100 %] 100 % (12/31 1123) Weight:  [77.1 kg] 77.1 kg (12/31 0831)  Weight change:  Filed Weights   03/13/18 0831  Weight: 77.1 kg    Intake/Output: No intake/output data recorded.   Intake/Output this shift:  No intake/output data recorded.  Physical Exam: General: NAD,   Head: Normocephalic, atraumatic. Moist oral mucosal membranes  Eyes: Anicteric, PERRL  Neck: Supple, trachea midline  Lungs:  Clear to auscultation  Heart: Regular rate and rhythm  Abdomen:  Soft, nontender, +ascites  Extremities: no peripheral edema.  Neurologic: Nonfocal, moving all four extremities  Skin: No lesions  Access: RIJ CVC    Basic Metabolic Panel: Recent Labs  Lab 03/13/18 0851  NA 140  K 5.4*  CL 93*  CO2 34*  GLUCOSE 95  BUN 43*  CREATININE 11.65*  CALCIUM 10.3    Liver Function Tests: No results for input(s): AST, ALT, ALKPHOS, BILITOT, PROT, ALBUMIN in the last 168 hours. No results for input(s): LIPASE, AMYLASE in the last 168 hours. No results for input(s): AMMONIA in the last 168 hours.  CBC: Recent Labs  Lab 03/13/18 0851  WBC 7.0  NEUTROABS 5.0  HGB 11.4*  HCT 39.9  MCV 86.9  PLT 373    Cardiac Enzymes: Recent Labs  Lab 03/13/18 0851  TROPONINI 0.07*    BNP: Invalid input(s): POCBNP  CBG: No results for input(s): GLUCAP in the last 168 hours.  Microbiology: Results for orders placed or performed during the hospital encounter of 01/29/18  Culture, blood (Routine x 2)     Status: None   Collection Time: 01/29/18  3:25 PM  Result Value Ref Range Status   Specimen  Description BLOOD BLOOD LEFT FOREARM  Final   Special Requests   Final    BOTTLES DRAWN AEROBIC AND ANAEROBIC Blood Culture results may not be optimal due to an inadequate volume of blood received in culture bottles   Culture   Final    NO GROWTH 5 DAYS Performed at [facility], [address] [place], [place] [address]    Report Status 02/03/2018 FINAL  Final  Culture, body fluid-bottle     Status: None   Collection Time: 01/29/18  5:05 PM  Result Value Ref Range Status   Specimen Description PERITONEAL  Final   Special Requests NONE  Final   Culture   Final    NO GROWTH 5 DAYS Performed at [facility], [place] [address] [place], [place] [address]    Report Status 02/03/2018 FINAL  Final  Gram stain     Status: None   Collection Time: 01/29/18  5:05 PM  Result Value Ref Range Status   Specimen Description PERITONEAL  Final   Special Requests NONE  Final   Gram Stain   Final    WBC PRESENT, PREDOMINANTLY MONONUCLEAR NO ORGANISMS SEEN CYTOSPIN SMEAR Performed at [facility], [place] [address], [place], [place] [address]    Report Status 01/30/2018 FINAL  Final  Culture, blood (Routine x 2)     Status: None   Collection  Time: 01/30/18  2:01 AM  Result Value Ref Range Status   Specimen Description BLOOD LEFT HAND  Final   Special Requests   Final    BOTTLES DRAWN AEROBIC AND ANAEROBIC Blood Culture adequate volume   Culture   Final    NO GROWTH 5 DAYS Performed at [facility], [address], [place], [place] [address]    Report Status 02/04/2018 FINAL  Final    Coagulation Studies: No results for input(s): LABPROT, INR in the last 72 hours.  Urinalysis: No results for input(s): COLORURINE, LABSPEC, PHURINE, GLUCOSEU, HGBUR, BILIRUBINUR, KETONESUR, PROTEINUR, UROBILINOGEN, NITRITE, LEUKOCYTESUR in the last 72 hours.  Invalid input(s): APPERANCEUR    Imaging: Dg Chest Portable 1 View  Result Date: 03/13/2018 CLINICAL DATA:  Cough,  shortness of breath. EXAM: PORTABLE CHEST 1 VIEW COMPARISON:  Radiographs of March 04, 2018. FINDINGS: Stable cardiomegaly. Atherosclerosis of thoracic aorta is noted. Right internal jugular dialysis catheter is unchanged in position. No pneumothorax or pleural effusion is noted. No acute pulmonary disease is noted. Mild central pulmonary vascular congestion is noted. Bony thorax is unremarkable. IMPRESSION: Stable cardiomegaly with mild central pulmonary vascular congestion. Aortic Atherosclerosis (ICD10-I70.0). Electronically Signed   By: Marijo Conception, M.D.   On: 03/13/2018 09:21     Medications:    . Chlorhexidine Gluconate Cloth  6 each Topical [id number]     Assessment/ Plan:  [name] is a 57 y.o. black male withend stage renal disease on hemodialysis secondary to Alport's syndrome, ascites, hypertension, anemia of chronic kidney disease, coronary artery disease, peripheral vascular disease, hyperlipidemia, gastrointestinal AVMs, pulmonary hypertension  [facility] TTS 80.5 kg RIJ permcath.   1. Hyperkalemia: requiring hemodialysis treatment with 2K bath.   2. End stage renal disease   Continue TTS schedule  3. Ascites - ultrasound guided paracentesis scheduled from 03/15/18  4. Hypertension: at goal.    5. Anemia of chronic kidney disease: hemoglobin 11.4   LOS: 0 Chiyo Fay 12/31/201911:52 AM

## 2018-03-13 NOTE — ED Provider Notes (Signed)
[facility] Emergency Department Provider Note ____________________________________________   First MD Initiated Contact with Patient 03/13/18 [phone]     (approximate)  I have reviewed the triage vital signs and the nursing notes.   HISTORY  Chief Complaint Shortness of Breath and Needs Dialysis    HPI Stanley [name] is a 57 y.o. male with PMH as noted below including ESRD on dialysis who presents with worsening shortness of breath over the last several days, occurring after he was unable to get his dialysis approximately for the last 2 weeks.  The patient states that he stopped getting picked up.  He was supposed to go to dialysis today, but again did not get picked up and was feeling worse so came to the hospital.  He denies chest pain or fever.  He reports associated cough which is nonproductive.  He also reports some diarrhea.  Past Medical History:  Diagnosis Date  . Alcohol abuse   . CHF (congestive heart failure) ([place])   . Cirrhosis ([place])   . Coronary artery disease 2009  . Drug abuse ([place])   . End stage renal disease on dialysis [facility]) NEPHROLOGIST-   DR [facility]  IN [place]   HEMODIALYSIS --   TUES/  THURS/  SAT  . Gastrointestinal bleed 06/13/2017   From chart...hx of multiple GI bleeds  . GERD (gastroesophageal reflux disease)   . Hyperlipidemia   . Hypertension   . PAD (peripheral artery disease) ([place])   . Renal insufficiency    Per pt, 32 oz fluid restriction per day  . S/P triple vessel bypass 06/09/2016   2009ish  . Suicidal ideation    & HOMICIDAL IDEATION --  06-16-2013   ADMITTED TO BEHAVIOR HEALTH    Patient Active Problem List   Diagnosis Date Noted  . Healthcare-associated pneumonia 01/15/2018  . Acute on chronic respiratory failure ([place]) 01/15/2018  . Acute pulmonary edema ([place]) 12/23/2017  . Acute respiratory failure ([place]) 10/29/2017  . ESRD (end stage renal disease) on dialysis ([place]) 07/28/2017  . Protein-calorie  malnutrition, severe 06/14/2017  . Encounter for dialysis [facility])   . Palliative care by specialist   . Goals of care, counseling/discussion   . Malnutrition of moderate degree 06/05/2017  . Secondary esophageal varices without bleeding ([place])   . Stomach irritation   . Idiopathic esophageal varices without bleeding ([place])   . Alcoholic hepatitis with ascites 05/24/2017  . ESRD (end stage renal disease) ([place]) 04/28/2017  . Uremia 03/08/2017  . ESRD on hemodialysis ([place]) 03/03/2017  . Weakness 02/28/2017  . Hypocalcemia 02/22/2017  . Shortness of breath 11/26/2016  . COPD (chronic obstructive pulmonary disease) ([place]) 10/30/2016  . COPD exacerbation ([place]) 10/29/2016  . Anemia   . Heme positive stool   . Ulceration of intestine   . Benign neoplasm of transverse colon   . Acute gastrointestinal hemorrhage   . Esophageal candidiasis ([place])   . Angiodysplasia of intestinal tract   . Acute respiratory failure with hypoxia ([place]) 07/03/2016  . GI bleeding 06/24/2016  . Rectal bleeding 06/14/2016  . Anemia of chronic disease 06/01/2016  . MRSA carrier 06/01/2016  . Chronic renal failure 05/23/2016  . Ischemic heart disease 05/23/2016  . Angiodysplasia of small intestine   . Melena   . Small bowel bleed not requiring more than 4 units of blood in 24 hours, ICU, or surgery   . Anemia due to chronic blood loss   . Abdominal pain 05/05/2016  . Acute posthemorrhagic anemia 04/17/2016  .  Gastrointestinal bleed 04/17/2016  . History of esophagogastroduodenoscopy (EGD) 04/17/2016  . Elevated troponin 04/17/2016  . Alcohol abuse 04/17/2016  . Upper GI bleed 01/19/2016  . Blood in stool   . Angiodysplasia of stomach and duodenum with hemorrhage   . Gastritis   . Reflux esophagitis   . GI bleed 05/16/2015  . Acute GI bleeding   . Symptomatic anemia 04/30/2015  . HTN (hypertension) 04/06/2015  . GERD (gastroesophageal reflux disease) 04/06/2015  . HLD (hyperlipidemia) 04/06/2015  . Dyspnea  04/06/2015  . Cirrhosis of liver with ascites ([place]) 04/06/2015  . Ascites 04/06/2015  . GIB (gastrointestinal bleeding) 03/23/2015  . Homicidal ideation 06/19/2013  . Suicidal intent 06/19/2013  . Homicidal ideations 06/19/2013  . Hyperkalemia 06/16/2013  . Mandible fracture ([place]) 06/05/2013  . Fracture, mandible ([place]) 06/02/2013  . Coronary atherosclerosis of native coronary artery 06/02/2013  . ESRD on dialysis ([place]) 06/02/2013  . Mandible open fracture ([place]) 06/02/2013    Past Surgical History:  Procedure Laterality Date  . A/V FISTULAGRAM Right 06/06/2017   Procedure: A/V FISTULAGRAM;  Surgeon: Katha Cabal, MD;  Location: [place] [facility];  Service: Cardiovascular;  Laterality: Right;  . A/V SHUNT INTERVENTION N/A 06/06/2017   Procedure: A/V SHUNT INTERVENTION;  Surgeon: Katha Cabal, MD;  Location: [place] [facility];  Service: Cardiovascular;  Laterality: N/A;  . AGILE CAPSULE N/A 06/19/2016   Procedure: AGILE CAPSULE;  Surgeon: [name], MD;  Location: [facility] ENDOSCOPY;  Service: Endoscopy;  Laterality: N/A;  . COLONOSCOPY WITH PROPOFOL N/A 06/18/2016   Procedure: COLONOSCOPY WITH PROPOFOL;  Surgeon: [name], MD;  Location: [facility] ENDOSCOPY;  Service: Endoscopy;  Laterality: N/A;  . COLONOSCOPY WITH PROPOFOL N/A 08/12/2016   Procedure: COLONOSCOPY WITH PROPOFOL;  Surgeon: [name], MD;  Location: [facility] ENDOSCOPY;  Service: Endoscopy;  Laterality: N/A;  . COLONOSCOPY WITH PROPOFOL N/A 05/05/2017   Procedure: COLONOSCOPY WITH PROPOFOL;  Surgeon: Manya Silvas, MD;  Location: [facility] ENDOSCOPY;  Service: Endoscopy;  Laterality: N/A;  . CORONARY ANGIOPLASTY  ?   PT UNABLE TO TELL IF  BEFORE OR AFTER  CABG  . CORONARY ARTERY BYPASS GRAFT  2008  ([place])   3 VESSEL  . DIALYSIS FISTULA CREATION  LAST SURGERY  APPOX  2008  . ENTEROSCOPY N/A 05/10/2016   Procedure: ENTEROSCOPY;  Surgeon: Jerene Bears, MD;  Location: [place];  Service: Gastroenterology;   Laterality: N/A;  . ENTEROSCOPY N/A 08/12/2016   Procedure: ENTEROSCOPY;  Surgeon: [name], MD;  Location: [facility] ENDOSCOPY;  Service: Endoscopy;  Laterality: N/A;  . ENTEROSCOPY Left 06/03/2017   Procedure: ENTEROSCOPY;  Surgeon: Virgel Manifold, MD;  Location: [facility] ENDOSCOPY;  Service: Endoscopy;  Laterality: Left;  Procedure date will ultimately depend on when patient is medically optimized before the procedure, pending hemodialysis and blood transfusions etc. Will place on schedule and change depending on clinical status.   . ENTEROSCOPY N/A 06/05/2017   Procedure: ENTEROSCOPY;  Surgeon: Virgel Manifold, MD;  Location: [facility] ENDOSCOPY;  Service: Endoscopy;  Laterality: N/A;  . ENTEROSCOPY N/A 06/15/2017   Procedure: Push ENTEROSCOPY;  Surgeon: [name], MD;  Location[facility]COPY;  Service: Endoscopy;  Laterality: N/A;  . ESOPHAGOGASTRODUODENOSCOPY N/A 05/07/2015   Procedure: ESOPHAGOGASTRODUODENOSCOPY (EGD);  Surgeon: Hulen Luster, MD;  Location: [facility] ENDOSCOPY;  Service: Endoscopy;  Laterality: N/A;  . ESOPHAGOGASTRODUODENOSCOPY (EGD) WITH PROPOFOL N/A 05/17/2015   Procedure: ESOPHAGOGASTRODUODENOSCOPY (EGD) WITH PROPOFOL;  Surgeon: [name], MD;  Location: [facility] ENDOSCOPY;  Service: Endoscopy;  Laterality: N/A;  . ESOPHAGOGASTRODUODENOSCOPY (EGD) WITH PROPOFOL N/A 01/20/2016   Procedure: ESOPHAGOGASTRODUODENOSCOPY (EGD) WITH PROPOFOL;  Surgeon: [name], MD;  Location: [facility] ENDOSCOPY;  Service: Endoscopy;  Laterality: N/A;  . ESOPHAGOGASTRODUODENOSCOPY (EGD) WITH PROPOFOL N/A 04/17/2016   Procedure: ESOPHAGOGASTRODUODENOSCOPY (EGD) WITH PROPOFOL;  Surgeon: Lin Landsman, MD;  Location: [facility] ENDOSCOPY;  Service: Gastroenterology;  Laterality: N/A;  . ESOPHAGOGASTRODUODENOSCOPY (EGD) WITH PROPOFOL  05/09/2016   Procedure: ESOPHAGOGASTRODUODENOSCOPY (EGD) WITH PROPOFOL;  Surgeon: Jerene Bears, MD;  Location: [place];  Service: Endoscopy;;  . ESOPHAGOGASTRODUODENOSCOPY (EGD)  WITH PROPOFOL N/A 06/16/2016   Procedure: ESOPHAGOGASTRODUODENOSCOPY (EGD) WITH PROPOFOL;  Surgeon: [name], MD;  Location: [facility] ENDOSCOPY;  Service: Endoscopy;  Laterality: N/A;  . ESOPHAGOGASTRODUODENOSCOPY (EGD) WITH PROPOFOL N/A 05/05/2017   Procedure: ESOPHAGOGASTRODUODENOSCOPY (EGD) WITH PROPOFOL;  Surgeon: Manya Silvas, MD;  Location: [facility] ENDOSCOPY;  Service: Endoscopy;  Laterality: N/A;  . ESOPHAGOGASTRODUODENOSCOPY (EGD) WITH PROPOFOL N/A 06/15/2017   Procedure: ESOPHAGOGASTRODUODENOSCOPY (EGD) WITH PROPOFOL;  Surgeon: [name], MD;  Location: [facility] ENDOSCOPY;  Service: Endoscopy;  Laterality: N/A;  . GIVENS CAPSULE STUDY N/A 05/07/2016   Procedure: GIVENS CAPSULE STUDY;  Surgeon: Doran Stabler, MD;  Location: [place];  Service: Endoscopy;  Laterality: N/A;  . MANDIBULAR HARDWARE REMOVAL N/A 07/29/2013   Procedure: REMOVAL OF ARCH BARS;  Surgeon: Theodoro Kos, DO;  Location: [place];  Service: Plastics;  Laterality: N/A;  . ORIF MANDIBULAR FRACTURE N/A 06/05/2013   Procedure: REPAIR OF MANDIBULAR FRACTURE x 2 with maxillo-mandibular fixation ;  Surgeon: Theodoro Kos, DO;  Location: [place];  Service: Plastics;  Laterality: N/A;  . PARACENTESIS    . PERIPHERAL ARTERIAL STENT GRAFT Left     Prior to Admission medications   Medication Sig Start Date End Date Taking? Authorizing Provider  budesonide-formoterol (SYMBICORT) 160-4.5 MCG/ACT inhaler Inhale 2 puffs into the lungs daily. Patient not taking: Reported on 02/20/2018 11/16/16   Vaughan Basta, MD  calcium acetate (PHOSLO) 667 MG capsule Take 2,001 mg by mouth 3 (three) times daily.    [provider]  folic acid (FOLVITE) 1 MG tablet Take 1 tablet (1 mg total) by mouth daily. Patient not taking: Reported on 02/20/2018 10/31/17   Fritzi Mandes, MD  gabapentin (NEURONTIN) 300 MG capsule Take 300 mg by mouth 3 (three) times daily.  08/16/16   [provider]  ipratropium-albuterol  (DUONEB) 0.5-2.5 (3) MG/3ML SOLN Take 3 mLs by nebulization every 6 (six) hours as needed. Patient not taking: Reported on 02/20/2018 11/28/16   Loletha Grayer, MD  multivitamin (RENA-VIT) TABS tablet Take 1 tablet by mouth at bedtime. 10/31/17   Fritzi Mandes, MD  mupirocin ointment (BACTROBAN) 2 % Place 1 application into the nose 2 (two) times daily. Patient not taking: Reported on 02/20/2018 01/19/18   Vaughan Basta, MD  omeprazole (PRILOSEC) 40 MG capsule Take 40 mg by mouth daily. 09/08/17   [provider]  Spacer/Aero Chamber Mouthpiece MISC 1 Units by Does not apply route every 4 (four) hours as needed (wheezing). Patient not taking: Reported on 02/20/2018 02/19/17   Darel Hong, MD  tiotropium (SPIRIVA HANDIHALER) 18 MCG inhalation capsule Place 1 capsule (18 mcg total) into inhaler and inhale daily. Patient not taking: Reported on 02/20/2018 01/30/18 04/30/18  Demetrios Loll, MD    Allergies Patient has no known allergies.  Family History  Problem Relation Age of Onset  . Colon cancer Mother   . Cancer Father   . Cancer Sister   .  Kidney disease Brother     Social History Social History   Tobacco Use  . Smoking status: Current Every Day Smoker    Packs/day: 0.15    Years: 40.00    Pack years: 6.00    Types: Cigarettes  . Smokeless tobacco: Never Used  Substance Use Topics  . Alcohol use: No    Comment: pt reports quitting after learning about cirrhosis  . Drug use: No    Frequency: 7.0 times per week    Types: Marijuana, Cocaine    Review of Systems  Constitutional: No fever. Eyes: No redness. ENT: No sore throat. Cardiovascular: Denies chest pain. Respiratory: Positive for shortness of breath. Gastrointestinal: No vomiting.  Positive for diarrhea. Genitourinary: Negative for dysuria.  Musculoskeletal: Negative for back pain. Skin: Negative for rash. Neurological: Negative for  headache.   ____________________________________________   PHYSICAL EXAM:  VITAL SIGNS: ED Triage Vitals  Enc Vitals Group     BP 03/13/18 0830 121/84     Pulse Rate 03/13/18 0830 80     Resp 03/13/18 0830 (!) 22     Temp 03/13/18 0830 98 F (36.7 C)     Temp Source 03/13/18 0830 Oral     SpO2 03/13/18 0829 90 %     Weight 03/13/18 0831 169 lb 15.6 oz (77.1 kg)     Height 03/13/18 0831 6\' 3"  (1.905 m)     Head Circumference --      Peak Flow --      Pain Score 03/13/18 0831 0     Pain Loc --      Pain Edu? --      Excl. in [place]? --     Constitutional: Alert and oriented.  Slightly uncomfortable appearing but in no acute distress. Eyes: Conjunctivae are normal.  Head: Atraumatic. Nose: No congestion/rhinnorhea. Mouth/Throat: Mucous membranes are moist.   Neck: Normal range of motion.  Cardiovascular: Normal rate, regular rhythm. Grossly normal heart sounds.  Good peripheral circulation. Respiratory: Somewhat increased respiratory effort.  No retractions.  Decreased breath sounds bilaterally with some wheezing. Gastrointestinal: Soft and nontender. No distention.  Genitourinary: No flank tenderness. Musculoskeletal: No lower extremity edema.  Extremities warm and well perfused.  Neurologic:  Normal speech and language. No gross focal neurologic deficits are appreciated.  Skin:  Skin is warm and dry. No rash noted. Psychiatric: Mood and affect are normal. Speech and behavior are normal.  ____________________________________________   LABS (all labs ordered are listed, but only abnormal results are displayed)  Labs Reviewed  BASIC METABOLIC PANEL - Abnormal; Notable for the following components:      Result Value   Potassium 5.4 (*)    Chloride 93 (*)    CO2 34 (*)    BUN 43 (*)    Creatinine, Ser 11.65 (*)    GFR calc non Af Amer 4 (*)    GFR calc Af Amer 5 (*)    All other components within normal limits  CBC WITH DIFFERENTIAL/PLATELET - Abnormal; Notable for  the following components:   Hemoglobin 11.4 (*)    MCH 24.8 (*)    MCHC 28.6 (*)    RDW 20.5 (*)    All other components within normal limits  BRAIN NATRIURETIC PEPTIDE - Abnormal; Notable for the following components:   B Natriuretic Peptide 4,060.0 (*)    All other components within normal limits  TROPONIN I - Abnormal; Notable for the following components:   Troponin I 0.07 (*)    All  other components within normal limits   ____________________________________________  EKG  ED ECG REPORT I, Arta Silence, the attending physician, personally viewed and interpreted this ECG.  Date: 03/13/2018 EKG Time: 836 Rate: 83 Rhythm: normal sinus rhythm QRS Axis: normal Intervals: Nonspecific IVCD ST/T Wave abnormalities: Nonspecific T wave abnormalities Narrative Interpretation: no evidence of acute ischemia  ____________________________________________  RADIOLOGY  CXR: Cardiomegaly and mild vascular congestion, no acute findings  ____________________________________________   PROCEDURES  Procedure(s) performed: No  Procedures  Critical Care performed: No ____________________________________________   INITIAL IMPRESSION / ASSESSMENT AND PLAN / ED COURSE  Pertinent labs & imaging results that were available during my care of the patient were reviewed by me and considered in my medical decision making (see chart for details).  57 year old male with history of ESRD and other PMH as noted above presents with worsening shortness of breath and cough over the last several days, after he missed his last several dialysis sessions.  He states he has not gotten dialysis in about 2 weeks because he was not picked up.  I reviewed the past medical records in Epic; the patient was most recently seen in the ED on 03/04/2018 for cough and shortness of breath although left before his work-up was complete.  He was admitted last month with upper kalemia and respiratory failure with  hypoxia.  ----------------------------------------- 10:50 AM on 03/13/2018 -----------------------------------------  The work-up reveals mild hyperkalemia, elevated BNP consistent with some fluid overload, and minimally elevated troponin consistent with the patient's baseline.  I consulted Dr. Juleen China from nephrology who agrees to take the patient for dialysis.  Based on the patient's clinical appearance at this time I think it is reasonably likely that he will be able to be discharged home after dialysis so I am not admitting him at this time.  We will reassess him after he gets dialyzed.  ____________________________________________   FINAL CLINICAL IMPRESSION(S) / ED DIAGNOSES  Final diagnoses:  End stage renal disease ([place])      NEW MEDICATIONS STARTED DURING THIS VISIT:  New Prescriptions   No medications on file     Note:  This document was prepared using Dragon voice recognition software and may include unintentional dictation errors.    Arta Silence, MD 03/13/18 1051

## 2018-03-15 ENCOUNTER — Ambulatory Visit: Admit: 2018-03-15 | Payer: Medicare Other

## 2018-03-17 IMAGING — CR DG RIBS W/ CHEST 3+V*R*
4 series · 4 of 4 positions shown · non-contrast
Comparison: Chest radiograph 03/20/2016

CLINICAL DATA: Syncope during dialysis, slipped in shower this
morning and fell striking RIGHT lateral ribs, history hypertension,
coronary disease, cirrhosis, end- stage renal disease alcohol and
drug abuse

EXAM:
RIGHT RIBS AND CHEST - 3+ VIEW

[chest pa]
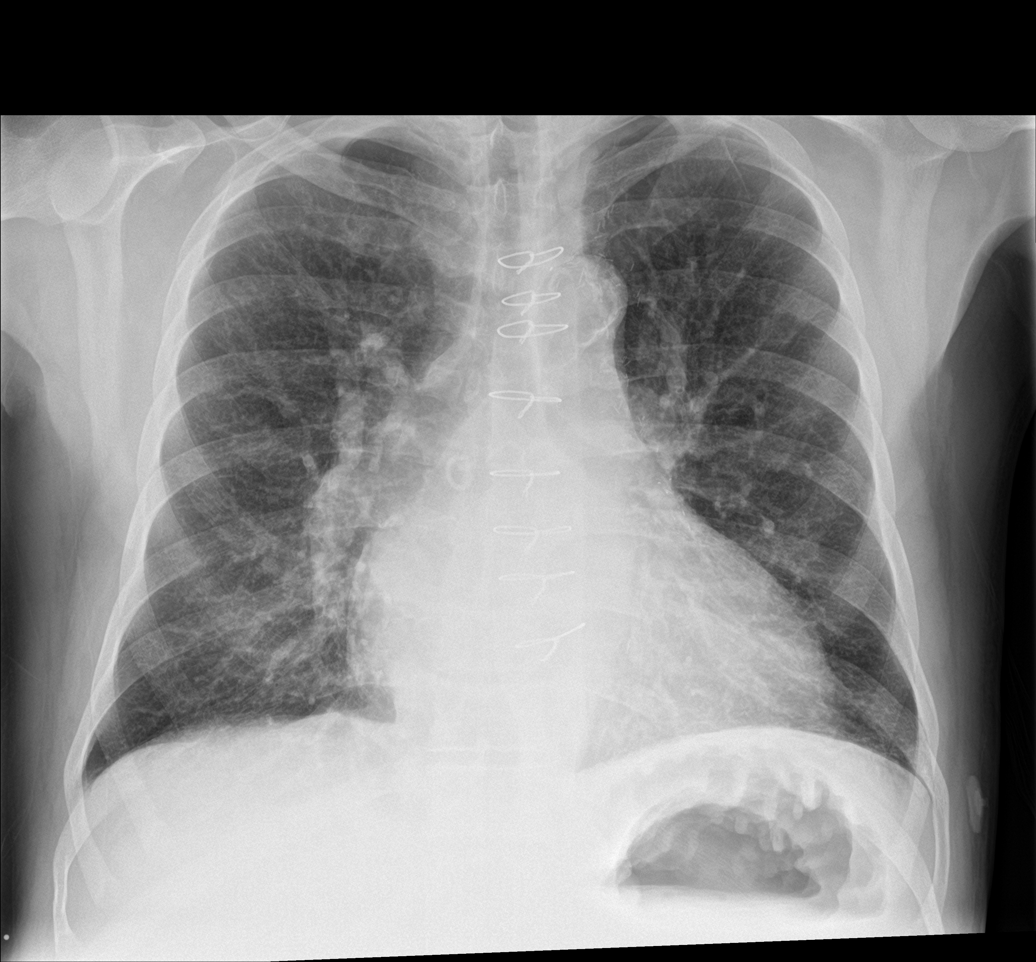

[rib pa]
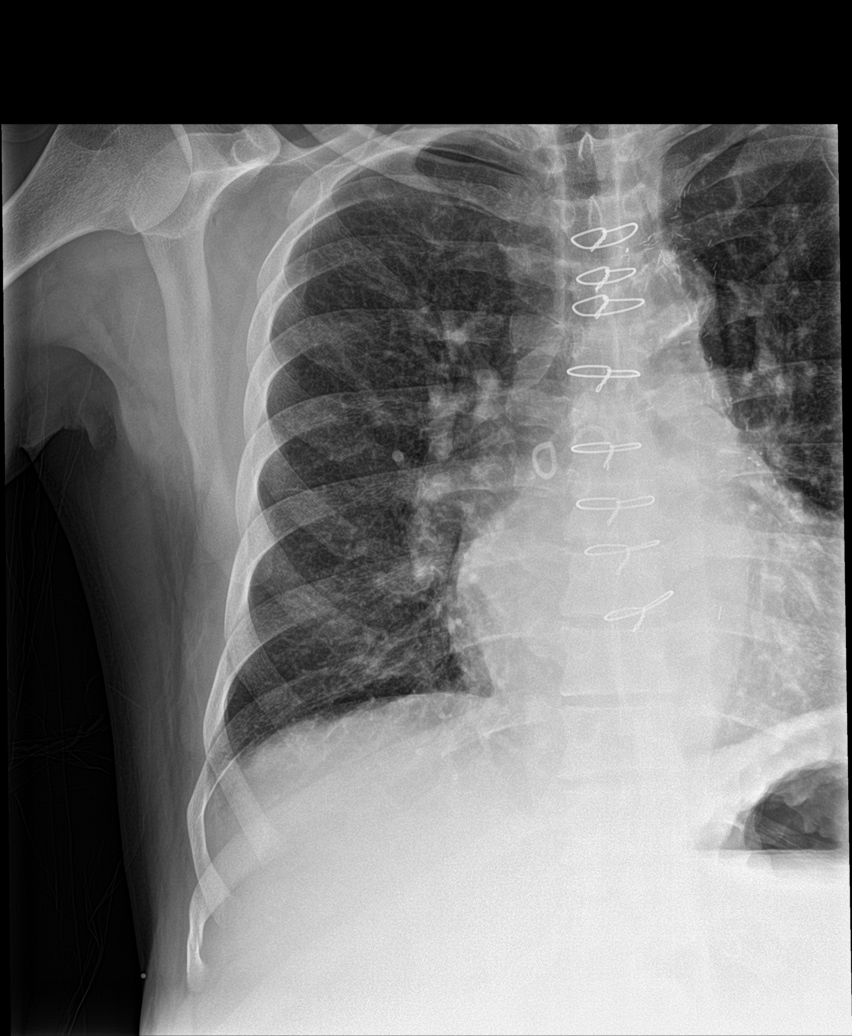

[rib ap]
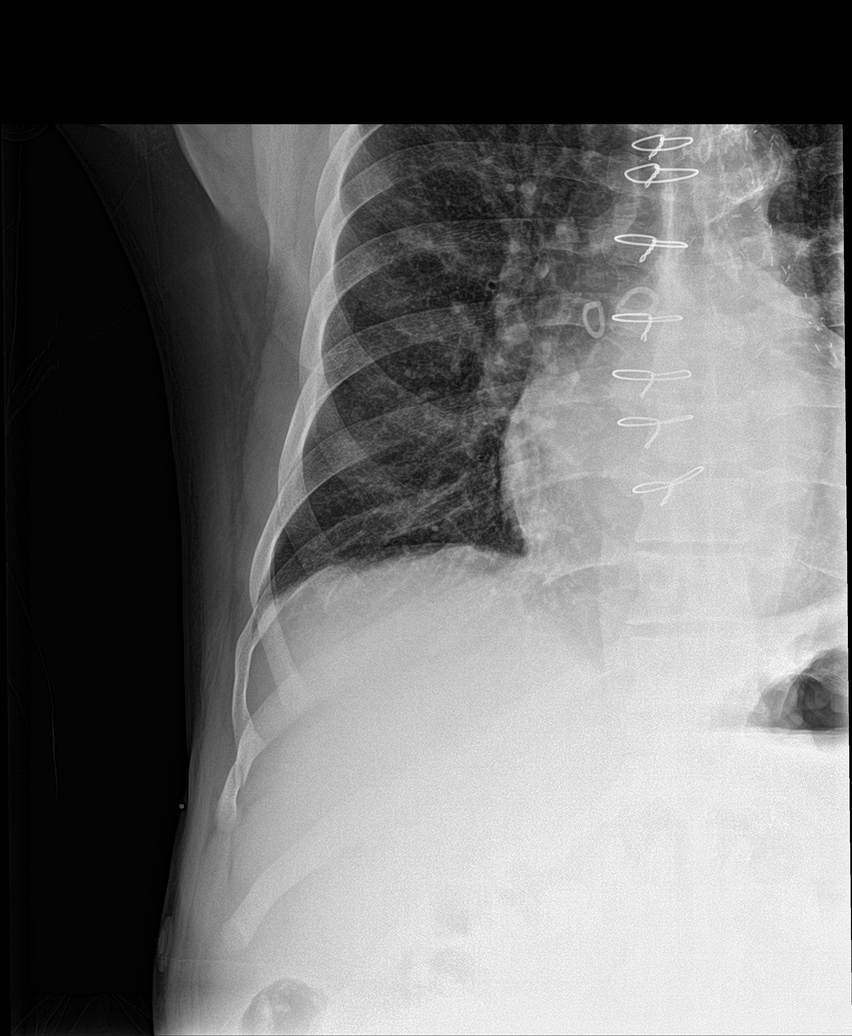

[rib ap obl]
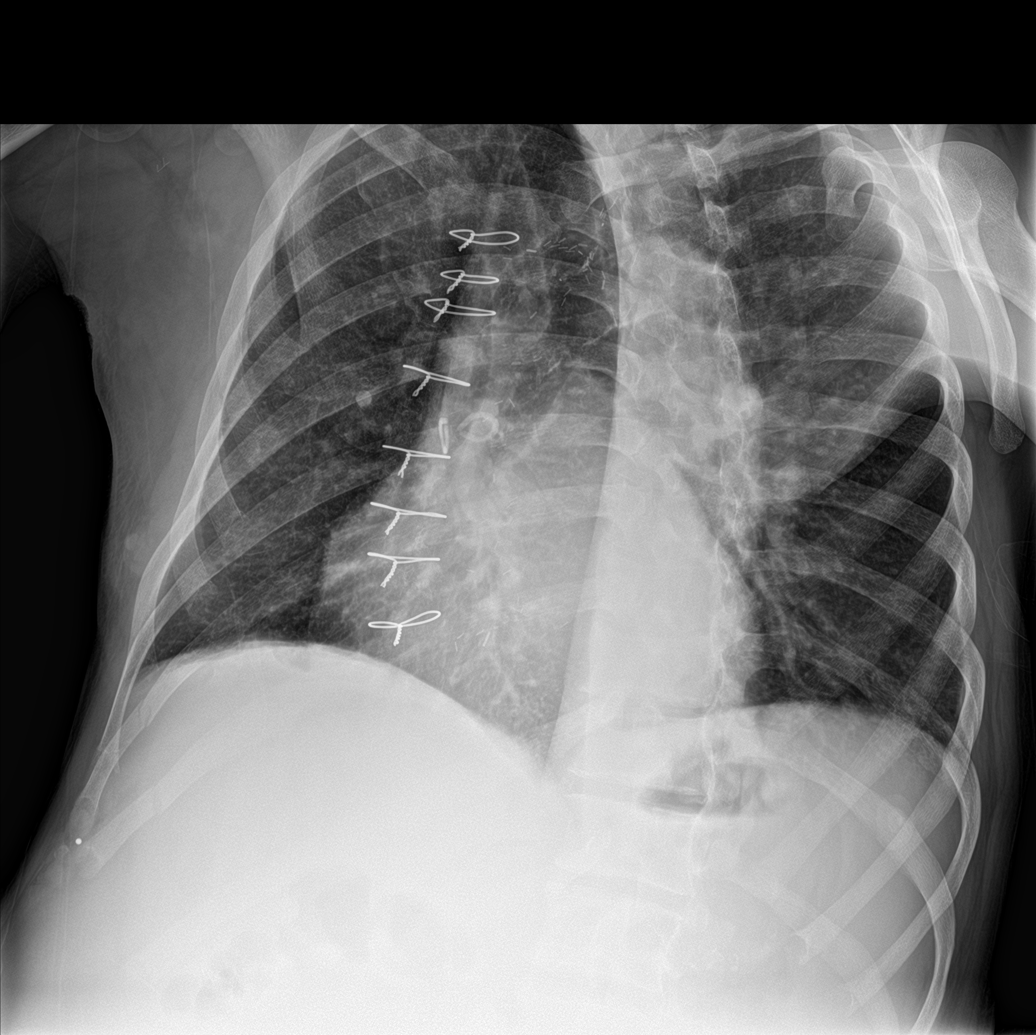

[4 of 4 positions shown; findings below may reference images not displayed]

FINDINGS: Enlargement of cardiac silhouette post CABG with pulmonary vascular
congestion

Atherosclerotic calcification aorta.

Minimal chronic accentuation of interstitial markings, stable.

No acute failure or consolidation.

No pleural effusion or pneumothorax.

Osseous mineralization grossly normal.

BB placed at site of symptoms lower lateral RIGHT chest.

Nondisplaced fractures of the lateral RIGHT ninth tenth ribs
identified.
IMPRESSION: Enlargement of cardiac silhouette with pulmonary vascular congestion
post CABG.

No acute RIGHT infiltrate or pneumothorax.

Nondisplaced fractures lateral RIGHT ninth and tenth ribs.

## 2018-03-20 ENCOUNTER — Other Ambulatory Visit
Admission: RE | Admit: 2018-03-20 | Discharge: 2018-03-20 | Disposition: A | Discharge status: Home / Self Care | Payer: Medicare Other | Source: Ambulatory Visit | Attending: Nephrology | Admitting: Nephrology

## 2018-03-20 DIAGNOSIS — N186 End stage renal disease: Secondary | ICD-10-CM | POA: Insufficient documentation

## 2018-03-20 DIAGNOSIS — D631 Anemia in chronic kidney disease: Secondary | ICD-10-CM | POA: Diagnosis not present

## 2018-03-20 DIAGNOSIS — D509 Iron deficiency anemia, unspecified: Secondary | ICD-10-CM | POA: Diagnosis not present

## 2018-03-20 DIAGNOSIS — N2581 Secondary hyperparathyroidism of renal origin: Secondary | ICD-10-CM | POA: Diagnosis not present

## 2018-03-20 DIAGNOSIS — Z1159 Encounter for screening for other viral diseases: Secondary | ICD-10-CM | POA: Diagnosis not present

## 2018-03-20 DIAGNOSIS — Z992 Dependence on renal dialysis: Secondary | ICD-10-CM | POA: Diagnosis not present

## 2018-03-20 LAB — CALCIUM: CALCIUM: 7.9 mg/dL — AB (ref 8.9–10.3)

## 2018-03-20 LAB — POTASSIUM: Potassium: 6.7 mmol/L (ref 3.5–5.1)

## 2018-03-21 ENCOUNTER — Other Ambulatory Visit: Payer: Self-pay | Admitting: Nephrology

## 2018-03-21 DIAGNOSIS — R188 Other ascites: Secondary | ICD-10-CM

## 2018-03-22 ENCOUNTER — Ambulatory Visit
Admission: RE | Admit: 2018-03-22 | Discharge: 2018-03-22 | Disposition: A | Discharge status: Home / Self Care | Payer: Medicare Other | Source: Ambulatory Visit | Attending: Nephrology | Admitting: Nephrology

## 2018-03-22 DIAGNOSIS — R188 Other ascites: Secondary | ICD-10-CM | POA: Insufficient documentation

## 2018-03-22 MED ORDER — ALBUMIN HUMAN 25 % IV SOLN: 25.0000 g | Freq: Once | INTRAVENOUS | Status: DC

## 2018-03-22 MED ORDER — ALBUMIN HUMAN 25 % IV SOLN
INTRAVENOUS | Status: AC
  Administered 2018-03-22: 25 g
  Filled 2018-03-22: qty 100

## 2018-03-26 ENCOUNTER — Other Ambulatory Visit: Payer: Self-pay

## 2018-03-26 ENCOUNTER — Inpatient Hospital Stay
Admission: EM | Admit: 2018-03-26 | Discharge: 2018-03-27 | DRG: 640 | Disposition: A | Discharge status: Home / Self Care | Payer: Medicare Other | Attending: Internal Medicine | Admitting: Internal Medicine

## 2018-03-26 ENCOUNTER — Emergency Department: Payer: Medicare Other

## 2018-03-26 DIAGNOSIS — F1021 Alcohol dependence, in remission: Secondary | ICD-10-CM | POA: Diagnosis not present

## 2018-03-26 DIAGNOSIS — R062 Wheezing: Secondary | ICD-10-CM | POA: Diagnosis not present

## 2018-03-26 DIAGNOSIS — J810 Acute pulmonary edema: Secondary | ICD-10-CM

## 2018-03-26 DIAGNOSIS — N2581 Secondary hyperparathyroidism of renal origin: Secondary | ICD-10-CM | POA: Diagnosis not present

## 2018-03-26 DIAGNOSIS — E119 Type 2 diabetes mellitus without complications: Secondary | ICD-10-CM | POA: Diagnosis not present

## 2018-03-26 DIAGNOSIS — R0602 Shortness of breath: Secondary | ICD-10-CM | POA: Diagnosis not present

## 2018-03-26 DIAGNOSIS — I2510 Atherosclerotic heart disease of native coronary artery without angina pectoris: Secondary | ICD-10-CM | POA: Diagnosis not present

## 2018-03-26 DIAGNOSIS — I739 Peripheral vascular disease, unspecified: Secondary | ICD-10-CM | POA: Diagnosis not present

## 2018-03-26 DIAGNOSIS — Z992 Dependence on renal dialysis: Secondary | ICD-10-CM

## 2018-03-26 DIAGNOSIS — I132 Hypertensive heart and chronic kidney disease with heart failure and with stage 5 chronic kidney disease, or end stage renal disease: Secondary | ICD-10-CM | POA: Diagnosis present

## 2018-03-26 DIAGNOSIS — Z7951 Long term (current) use of inhaled steroids: Secondary | ICD-10-CM

## 2018-03-26 DIAGNOSIS — F17210 Nicotine dependence, cigarettes, uncomplicated: Secondary | ICD-10-CM | POA: Diagnosis present

## 2018-03-26 DIAGNOSIS — N186 End stage renal disease: Secondary | ICD-10-CM | POA: Diagnosis not present

## 2018-03-26 DIAGNOSIS — J440 Chronic obstructive pulmonary disease with acute lower respiratory infection: Secondary | ICD-10-CM | POA: Diagnosis present

## 2018-03-26 DIAGNOSIS — I509 Heart failure, unspecified: Secondary | ICD-10-CM | POA: Diagnosis present

## 2018-03-26 DIAGNOSIS — Z800 Family history of malignant neoplasm of digestive organs: Secondary | ICD-10-CM

## 2018-03-26 DIAGNOSIS — K219 Gastro-esophageal reflux disease without esophagitis: Secondary | ICD-10-CM | POA: Diagnosis not present

## 2018-03-26 DIAGNOSIS — Z841 Family history of disorders of kidney and ureter: Secondary | ICD-10-CM | POA: Diagnosis not present

## 2018-03-26 DIAGNOSIS — Z9115 Patient's noncompliance with renal dialysis: Secondary | ICD-10-CM

## 2018-03-26 DIAGNOSIS — J209 Acute bronchitis, unspecified: Secondary | ICD-10-CM | POA: Diagnosis present

## 2018-03-26 DIAGNOSIS — D631 Anemia in chronic kidney disease: Secondary | ICD-10-CM | POA: Diagnosis not present

## 2018-03-26 DIAGNOSIS — Z951 Presence of aortocoronary bypass graft: Secondary | ICD-10-CM | POA: Diagnosis not present

## 2018-03-26 DIAGNOSIS — K7460 Unspecified cirrhosis of liver: Secondary | ICD-10-CM | POA: Diagnosis not present

## 2018-03-26 DIAGNOSIS — Z79899 Other long term (current) drug therapy: Secondary | ICD-10-CM

## 2018-03-26 DIAGNOSIS — Z9119 Patient's noncompliance with other medical treatment and regimen: Secondary | ICD-10-CM | POA: Diagnosis not present

## 2018-03-26 DIAGNOSIS — R609 Edema, unspecified: Secondary | ICD-10-CM | POA: Diagnosis not present

## 2018-03-26 DIAGNOSIS — E875 Hyperkalemia: Secondary | ICD-10-CM | POA: Diagnosis not present

## 2018-03-26 DIAGNOSIS — G629 Polyneuropathy, unspecified: Secondary | ICD-10-CM | POA: Diagnosis present

## 2018-03-26 DIAGNOSIS — E785 Hyperlipidemia, unspecified: Secondary | ICD-10-CM | POA: Diagnosis not present

## 2018-03-26 DIAGNOSIS — R0902 Hypoxemia: Secondary | ICD-10-CM | POA: Diagnosis not present

## 2018-03-26 DIAGNOSIS — R0689 Other abnormalities of breathing: Secondary | ICD-10-CM | POA: Diagnosis not present

## 2018-03-26 DIAGNOSIS — I110 Hypertensive heart disease with heart failure: Secondary | ICD-10-CM | POA: Diagnosis not present

## 2018-03-26 LAB — BASIC METABOLIC PANEL
Anion gap: 13 (ref 5–15)
BUN: 53 mg/dL — AB (ref 6–20)
CHLORIDE: 90 mmol/L — AB (ref 98–111)
CO2: 33 mmol/L — ABNORMAL HIGH (ref 22–32)
Calcium: 8.1 mg/dL — ABNORMAL LOW (ref 8.9–10.3)
Creatinine, Ser: 13.29 mg/dL — ABNORMAL HIGH (ref 0.61–1.24)
GFR calc Af Amer: 4 mL/min — ABNORMAL LOW (ref >60)
GFR calc non Af Amer: 4 mL/min — ABNORMAL LOW (ref >60)
Glucose, Bld: 113 mg/dL — ABNORMAL HIGH (ref 70–99)
Potassium: 6.6 mmol/L (ref 3.5–5.1)
Sodium: 136 mmol/L (ref 135–145)

## 2018-03-26 LAB — CBC
HEMATOCRIT: 38 % — AB (ref 39.0–52.0)
Hemoglobin: 10.9 g/dL — ABNORMAL LOW (ref 13.0–17.0)
MCH: 24.3 pg — ABNORMAL LOW (ref 26.0–34.0)
MCHC: 28.7 g/dL — ABNORMAL LOW (ref 30.0–36.0)
MCV: 84.6 fL (ref 80.0–100.0)
Platelets: 307 10*3/uL (ref 150–400)
RBC: 4.49 MIL/uL (ref 4.22–5.81)
RDW: 18.4 % — ABNORMAL HIGH (ref 11.5–15.5)
WBC: 5.8 10*3/uL (ref 4.0–10.5)
nRBC: 0 % (ref 0.0–0.2)

## 2018-03-26 LAB — HEPATIC FUNCTION PANEL
ALT: 13 U/L (ref 0–44)
AST: 19 U/L (ref 15–41)
Albumin: 2.7 g/dL — ABNORMAL LOW (ref 3.5–5.0)
Alkaline Phosphatase: 114 U/L (ref 38–126)
Bilirubin, Direct: 0.2 mg/dL (ref 0.0–0.2)
Indirect Bilirubin: 0.5 mg/dL (ref 0.3–0.9)
Total Bilirubin: 0.7 mg/dL (ref 0.3–1.2)
Total Protein: 6.5 g/dL (ref 6.5–8.1)

## 2018-03-26 LAB — LIPASE, BLOOD: Lipase: 21 U/L (ref 11–51)

## 2018-03-26 LAB — BRAIN NATRIURETIC PEPTIDE: B NATRIURETIC PEPTIDE 5: 2891 pg/mL — AB (ref 0.0–100.0)

## 2018-03-26 LAB — TROPONIN I
TROPONIN I: 0.06 ng/mL — AB (ref <0.03)
Troponin I: 0.07 ng/mL (ref <0.03)

## 2018-03-26 LAB — PHOSPHORUS: Phosphorus: 7 mg/dL — ABNORMAL HIGH (ref 2.5–4.6)

## 2018-03-26 LAB — INFLUENZA PANEL BY PCR (TYPE A & B)
Influenza A By PCR: NEGATIVE
Influenza B By PCR: NEGATIVE

## 2018-03-26 MED ORDER — ACETAMINOPHEN 650 MG RE SUPP: 650.0000 mg | Freq: Four times a day (QID) | RECTAL | Status: DC | PRN

## 2018-03-26 MED ORDER — ALTEPLASE 2 MG IJ SOLR: 2.0000 mg | Freq: Once | INTRAMUSCULAR | Status: DC | PRN

## 2018-03-26 MED ORDER — GABAPENTIN 300 MG PO CAPS
300.0000 mg | ORAL_CAPSULE | Freq: Three times a day (TID) | ORAL | Status: DC
  Administered 2018-03-27: 300 mg via ORAL
  Filled 2018-03-26: qty 1

## 2018-03-26 MED ORDER — DEXTROSE 50 % IV SOLN: 1.0000 | Freq: Once | INTRAVENOUS | Status: DC

## 2018-03-26 MED ORDER — SODIUM CHLORIDE 0.9 % IV SOLN: 1.0000 g | Freq: Once | INTRAVENOUS | Status: DC

## 2018-03-26 MED ORDER — SODIUM CHLORIDE 0.9% FLUSH
3.0000 mL | Freq: Two times a day (BID) | INTRAVENOUS | Status: DC
  Administered 2018-03-26: 3 mL via INTRAVENOUS

## 2018-03-26 MED ORDER — ONDANSETRON HCL 4 MG/2ML IJ SOLN
4.0000 mg | Freq: Once | INTRAMUSCULAR | Status: AC
  Administered 2018-03-26: 4 mg via INTRAVENOUS
  Filled 2018-03-26: qty 2

## 2018-03-26 MED ORDER — SODIUM CHLORIDE 0.9 % IV SOLN: 100.0000 mL | INTRAVENOUS | Status: DC | PRN

## 2018-03-26 MED ORDER — PANTOPRAZOLE SODIUM 40 MG PO TBEC
40.0000 mg | DELAYED_RELEASE_TABLET | Freq: Every day | ORAL | Status: DC
  Administered 2018-03-27: 40 mg via ORAL
  Filled 2018-03-26: qty 1

## 2018-03-26 MED ORDER — DIPHENHYDRAMINE HCL 50 MG/ML IJ SOLN
12.5000 mg | Freq: Once | INTRAMUSCULAR | Status: AC
  Administered 2018-03-26: 12.5 mg via INTRAVENOUS

## 2018-03-26 MED ORDER — ALUM & MAG HYDROXIDE-SIMETH 200-200-20 MG/5ML PO SUSP
30.0000 mL | Freq: Once | ORAL | Status: DC
  Filled 2018-03-26: qty 30

## 2018-03-26 MED ORDER — ONDANSETRON HCL 4 MG PO TABS: 4.0000 mg | ORAL_TABLET | Freq: Four times a day (QID) | ORAL | Status: DC | PRN

## 2018-03-26 MED ORDER — HEPARIN SODIUM (PORCINE) 1000 UNIT/ML DIALYSIS: 1000.0000 [IU] | INTRAMUSCULAR | Status: DC | PRN

## 2018-03-26 MED ORDER — LIDOCAINE HCL (PF) 1 % IJ SOLN
5.0000 mL | INTRAMUSCULAR | Status: DC | PRN
  Filled 2018-03-26: qty 5

## 2018-03-26 MED ORDER — CHLORHEXIDINE GLUCONATE CLOTH 2 % EX PADS: 6.0000 | MEDICATED_PAD | Freq: Every day | CUTANEOUS | Status: DC

## 2018-03-26 MED ORDER — DOXYCYCLINE HYCLATE 100 MG PO TABS
100.0000 mg | ORAL_TABLET | Freq: Two times a day (BID) | ORAL | Status: DC
  Administered 2018-03-26 – 2018-03-27 (×2): 100 mg via ORAL
  Filled 2018-03-26 (×2): qty 1

## 2018-03-26 MED ORDER — HEPARIN SODIUM (PORCINE) 5000 UNIT/ML IJ SOLN: 5000.0000 [IU] | Freq: Three times a day (TID) | INTRAMUSCULAR | Status: DC

## 2018-03-26 MED ORDER — ONDANSETRON HCL 4 MG/2ML IJ SOLN: 4.0000 mg | Freq: Once | INTRAMUSCULAR | Status: DC

## 2018-03-26 MED ORDER — SODIUM CHLORIDE 0.9 % IV SOLN: 250.0000 mL | INTRAVENOUS | Status: DC | PRN

## 2018-03-26 MED ORDER — CALCIUM GLUCONATE-NACL 1-0.675 GM/50ML-% IV SOLN
1.0000 g | Freq: Once | INTRAVENOUS | Status: AC
  Administered 2018-03-26: 1000 mg via INTRAVENOUS
  Filled 2018-03-26: qty 50

## 2018-03-26 MED ORDER — INSULIN ASPART 100 UNIT/ML ~~LOC~~ SOLN: 10.0000 [IU] | Freq: Once | SUBCUTANEOUS | Status: DC

## 2018-03-26 MED ORDER — RENA-VITE PO TABS: 1.0000 | ORAL_TABLET | Freq: Every day | ORAL | Status: DC

## 2018-03-26 MED ORDER — ALUM & MAG HYDROXIDE-SIMETH 200-200-20 MG/5ML PO SUSP
30.0000 mL | Freq: Once | ORAL | Status: AC
  Administered 2018-03-26: 30 mL via ORAL
  Filled 2018-03-26: qty 30

## 2018-03-26 MED ORDER — ONDANSETRON HCL 4 MG/2ML IJ SOLN
4.0000 mg | Freq: Four times a day (QID) | INTRAMUSCULAR | Status: DC | PRN
  Administered 2018-03-26 – 2018-03-27 (×2): 4 mg via INTRAVENOUS
  Filled 2018-03-26 (×2): qty 2

## 2018-03-26 MED ORDER — CALCIUM ACETATE (PHOS BINDER) 667 MG PO CAPS
2001.0000 mg | ORAL_CAPSULE | Freq: Three times a day (TID) | ORAL | Status: DC
  Administered 2018-03-27 (×2): 2001 mg via ORAL
  Filled 2018-03-26 (×2): qty 3

## 2018-03-26 MED ORDER — TRAMADOL HCL 50 MG PO TABS: 50.0000 mg | ORAL_TABLET | Freq: Two times a day (BID) | ORAL | Status: DC | PRN

## 2018-03-26 MED ORDER — LIDOCAINE-PRILOCAINE 2.5-2.5 % EX CREA
1.0000 "application " | TOPICAL_CREAM | CUTANEOUS | Status: DC | PRN
  Filled 2018-03-26: qty 5

## 2018-03-26 MED ORDER — ACETAMINOPHEN 325 MG PO TABS: 650.0000 mg | ORAL_TABLET | Freq: Four times a day (QID) | ORAL | Status: DC | PRN

## 2018-03-26 MED ORDER — SODIUM CHLORIDE 0.9% FLUSH: 3.0000 mL | INTRAVENOUS | Status: DC | PRN

## 2018-03-26 MED ORDER — PENTAFLUOROPROP-TETRAFLUOROETH EX AERO
1.0000 "application " | INHALATION_SPRAY | CUTANEOUS | Status: DC | PRN
  Filled 2018-03-26: qty 30

## 2018-03-26 MED ORDER — IPRATROPIUM-ALBUTEROL 0.5-2.5 (3) MG/3ML IN SOLN
3.0000 mL | RESPIRATORY_TRACT | Status: DC
  Administered 2018-03-26 – 2018-03-27 (×2): 3 mL via RESPIRATORY_TRACT
  Filled 2018-03-26 (×3): qty 3

## 2018-03-26 NOTE — Progress Notes (Signed)
Pre HD assessment    03/26/18 1648  Neurological  Level of Consciousness Alert  Orientation Level Oriented X4  Respiratory  Respiratory Pattern Regular;Unlabored  Chest Assessment Chest expansion symmetrical  Cardiac  Pulse Regular  ECG Monitor Yes  Cardiac Rhythm NSR  Vascular  R Radial Pulse +2  L Radial Pulse +2  Edema Generalized  Integumentary  Integumentary (WDL) X  Skin Color Appropriate for ethnicity  Musculoskeletal  Musculoskeletal (WDL) X  Generalized Weakness Yes  Assistive Device None  GU Assessment  Genitourinary (WDL) X  Genitourinary Symptoms  (HD)  Psychosocial  Psychosocial (WDL) WDL

## 2018-03-26 NOTE — ED Notes (Signed)
Date and time results received: 03/26/18 3:58 PM  Test: troponin Critical Value: 0.07  Name of Provider Notified: Dr Mariea Clonts

## 2018-03-26 NOTE — Progress Notes (Signed)
HD tx start    03/26/18 1652  Vital Signs  Pulse Rate 87  Pulse Rate Source Monitor  Resp 20  BP (!) 145/90  BP Location Left Arm  BP Method Automatic  Patient Position (if appropriate) Lying  Oxygen Therapy  SpO2 97 %  O2 Device Room Air  During Hemodialysis Assessment  Blood Flow Rate (mL/min) 400 mL/min  Arterial Pressure (mmHg) -160 mmHg  Venous Pressure (mmHg) 160 mmHg  Transmembrane Pressure (mmHg) 60 mmHg  Ultrafiltration Rate (mL/min) 860 mL/min  Dialysate Flow Rate (mL/min) 800 ml/min  Conductivity: Machine  13.5  HD Safety Checks Performed Yes  Dialysis Fluid Bolus Normal Saline  Bolus Amount (mL) 250 mL  Intra-Hemodialysis Comments Tx initiated  Hemodialysis Catheter Right Internal jugular Double-lumen  No Placement Date or Time found.   Placed prior to admission: Yes  Orientation: Right  Access Location: Internal jugular  Hemodialysis Catheter Type: Double-lumen  Blue Lumen Status Infusing  Red Lumen Status Infusing

## 2018-03-26 NOTE — H&P (Addendum)
[facility] [place] NAME: Stanley [name]    MR#:  [id number]  DATE OF BIRTH:  07-07-1960  DATE OF ADMISSION:  03/26/2018  PRIMARY CARE PHYSICIAN: Alene Mires Elyse Jarvis, MD   REQUESTING/REFERRING PHYSICIAN: Eula Listen, MD  CHIEF COMPLAINT:   Chief Complaint  Patient presents with  . Shortness of Breath  . Weakness    HISTORY OF PRESENT ILLNESS: Stanley [name]  is a 58 y.o. male with a known history of alcohol abuse, congestive heart failure, liver cirrhosis, coronary artery disease, drug abuse, end-stage renal disease on hemodialysis who has been noncompliant with his dialysis.  Patient presents with shortness of breath.  In the ER is noted to have hyperkalemia and chest x-ray suggestive of congestive heart failure due to fluid overload.  Patient's last hemodialysis was 6 days ago.  He is having cough and congestion as well complains of some wheezing is   PAST MEDICAL HISTORY:   Past Medical History:  Diagnosis Date  . Alcohol abuse   . CHF (congestive heart failure) ([place])   . Cirrhosis ([place])   . Coronary artery disease 2009  . Drug abuse ([place])   . End stage renal disease on dialysis [facility]) NEPHROLOGIST-   DR [facility]  IN [place]   HEMODIALYSIS --   TUES/  THURS/  SAT  . Gastrointestinal bleed 06/13/2017   From chart...hx of multiple GI bleeds  . GERD (gastroesophageal reflux disease)   . Hyperlipidemia   . Hypertension   . PAD (peripheral artery disease) ([place])   . Renal insufficiency    Per pt, 32 oz fluid restriction per day  . S/P triple vessel bypass 06/09/2016   2009ish  . Suicidal ideation    & HOMICIDAL IDEATION --  06-16-2013   ADMITTED TO BEHAVIOR HEALTH    PAST SURGICAL HISTORY:  Past Surgical History:  Procedure Laterality Date  . A/V FISTULAGRAM Right 06/06/2017   Procedure: A/V FISTULAGRAM;  Surgeon: Katha Cabal, MD;  Location: [place] CV LAB;  Service: Cardiovascular;  Laterality: Right;   . A/V SHUNT INTERVENTION N/A 06/06/2017   Procedure: A/V SHUNT INTERVENTION;  Surgeon: Katha Cabal, MD;  Location: [place] CV LAB;  Service: Cardiovascular;  Laterality: N/A;  . AGILE CAPSULE N/A 06/19/2016   Procedure: AGILE CAPSULE;  Surgeon: [name], MD;  Location: [facility] ENDOSCOPY;  Service: Endoscopy;  Laterality: N/A;  . COLONOSCOPY WITH PROPOFOL N/A 06/18/2016   Procedure: COLONOSCOPY WITH PROPOFOL;  Surgeon: [name], MD;  Location: [facility] ENDOSCOPY;  Service: Endoscopy;  Laterality: N/A;  . COLONOSCOPY WITH PROPOFOL N/A 08/12/2016   Procedure: COLONOSCOPY WITH PROPOFOL;  Surgeon: [name], MD;  Location: [facility] ENDOSCOPY;  Service: Endoscopy;  Laterality: N/A;  . COLONOSCOPY WITH PROPOFOL N/A 05/05/2017   Procedure: COLONOSCOPY WITH PROPOFOL;  Surgeon: Manya Silvas, MD;  Location: [facility] ENDOSCOPY;  Service: Endoscopy;  Laterality: N/A;  . CORONARY ANGIOPLASTY  ?   PT UNABLE TO TELL IF  BEFORE OR AFTER  CABG  . CORONARY ARTERY BYPASS GRAFT  2008  ([place] , [place])   3 VESSEL  . DIALYSIS FISTULA CREATION  LAST SURGERY  APPOX  2008  . ENTEROSCOPY N/A 05/10/2016   Procedure: ENTEROSCOPY;  Surgeon: Jerene Bears, MD;  Location: [place];  Service: Gastroenterology;  Laterality: N/A;  . ENTEROSCOPY N/A 08/12/2016   Procedure: ENTEROSCOPY;  Surgeon: [name], MD;  Location: [facility] ENDOSCOPY;  Service: Endoscopy;  Laterality: N/A;  . ENTEROSCOPY Left 06/03/2017  Procedure: ENTEROSCOPY;  Surgeon: Virgel Manifold, MD;  Location[facility]COPY;  Service: Endoscopy;  Laterality: Left;  Procedure date will ultimately depend on when patient is medically optimized before the procedure, pending hemodialysis and blood transfusions etc. Will place on schedule and change depending on clinical status.   . ENTEROSCOPY N/A 06/05/2017   Procedure: ENTEROSCOPY;  Surgeon: Virgel Manifold, MD;  Location: [facility] ENDOSCOPY;  Service: Endoscopy;  Laterality: N/A;  . ENTEROSCOPY N/A 06/15/2017    Procedure: Push ENTEROSCOPY;  Surgeon: [name], MD;  Location: [facility] ENDOSCOPY;  Service: Endoscopy;  Laterality: N/A;  . ESOPHAGOGASTRODUODENOSCOPY N/A 05/07/2015   Procedure: ESOPHAGOGASTRODUODENOSCOPY (EGD);  Surgeon: Hulen Luster, MD;  Location: [facility] ENDOSCOPY;  Service: Endoscopy;  Laterality: N/A;  . ESOPHAGOGASTRODUODENOSCOPY (EGD) WITH PROPOFOL N/A 05/17/2015   Procedure: ESOPHAGOGASTRODUODENOSCOPY (EGD) WITH PROPOFOL;  Surgeon: [name], MD;  Location: [facility] ENDOSCOPY;  Service: Endoscopy;  Laterality: N/A;  . ESOPHAGOGASTRODUODENOSCOPY (EGD) WITH PROPOFOL N/A 01/20/2016   Procedure: ESOPHAGOGASTRODUODENOSCOPY (EGD) WITH PROPOFOL;  Surgeon: [name], MD;  Location: [facility] ENDOSCOPY;  Service: Endoscopy;  Laterality: N/A;  . ESOPHAGOGASTRODUODENOSCOPY (EGD) WITH PROPOFOL N/A 04/17/2016   Procedure: ESOPHAGOGASTRODUODENOSCOPY (EGD) WITH PROPOFOL;  Surgeon: Lin Landsman, MD;  Location: [facility] ENDOSCOPY;  Service: Gastroenterology;  Laterality: N/A;  . ESOPHAGOGASTRODUODENOSCOPY (EGD) WITH PROPOFOL  05/09/2016   Procedure: ESOPHAGOGASTRODUODENOSCOPY (EGD) WITH PROPOFOL;  Surgeon: Jerene Bears, MD;  Location: [place];  Service: Endoscopy;;  . ESOPHAGOGASTRODUODENOSCOPY (EGD) WITH PROPOFOL N/A 06/16/2016   Procedure: ESOPHAGOGASTRODUODENOSCOPY (EGD) WITH PROPOFOL;  Surgeon: [name], MD;  Location: [facility] ENDOSCOPY;  Service: Endoscopy;  Laterality: N/A;  . ESOPHAGOGASTRODUODENOSCOPY (EGD) WITH PROPOFOL N/A 05/05/2017   Procedure: ESOPHAGOGASTRODUODENOSCOPY (EGD) WITH PROPOFOL;  Surgeon: Manya Silvas, MD;  Location: [facility] ENDOSCOPY;  Service: Endoscopy;  Laterality: N/A;  . ESOPHAGOGASTRODUODENOSCOPY (EGD) WITH PROPOFOL N/A 06/15/2017   Procedure: ESOPHAGOGASTRODUODENOSCOPY (EGD) WITH PROPOFOL;  Surgeon: [name], MD;  Location: [facility] ENDOSCOPY;  Service: Endoscopy;  Laterality: N/A;  . GIVENS CAPSULE STUDY N/A 05/07/2016   Procedure: GIVENS CAPSULE STUDY;  Surgeon: Doran Stabler,  MD;  Location: [place];  Service: Endoscopy;  Laterality: N/A;  . MANDIBULAR HARDWARE REMOVAL N/A 07/29/2013   Procedure: REMOVAL OF ARCH BARS;  Surgeon: Theodoro Kos, DO;  Location: [place];  Service: Plastics;  Laterality: N/A;  . ORIF MANDIBULAR FRACTURE N/A 06/05/2013   Procedure: REPAIR OF MANDIBULAR FRACTURE x 2 with maxillo-mandibular fixation ;  Surgeon: Theodoro Kos, DO;  Location: [place];  Service: Plastics;  Laterality: N/A;  . PARACENTESIS    . PERIPHERAL ARTERIAL STENT GRAFT Left     SOCIAL HISTORY:  Social History   Tobacco Use  . Smoking status: Current Every Day Smoker    Packs/day: 0.15    Years: 40.00    Pack years: 6.00    Types: Cigarettes  . Smokeless tobacco: Never Used  Substance Use Topics  . Alcohol use: No    Comment: pt reports quitting after learning about cirrhosis    FAMILY HISTORY:  Family History  Problem Relation Age of Onset  . Colon cancer Mother   . Cancer Father   . Cancer Sister   . Kidney disease Brother     DRUG ALLERGIES: No Known Allergies  REVIEW OF SYSTEMS:   CONSTITUTIONAL: No fever, fatigue or weakness.  EYES: No blurred or double vision.  EARS, NOSE, AND THROAT: No tinnitus or ear pain.  RESPIRATORY: Positive cough, positive shortness of breath, positive wheezing or hemoptysis.  CARDIOVASCULAR: No chest pain, orthopnea, edema.  GASTROINTESTINAL: No nausea, vomiting, diarrhea or abdominal pain.  GENITOURINARY: No dysuria, hematuria.  ENDOCRINE: No polyuria, nocturia,  HEMATOLOGY: No anemia, easy bruising or bleeding SKIN: No rash or lesion. MUSCULOSKELETAL: No joint pain or arthritis.   NEUROLOGIC: No tingling, numbness, weakness.  PSYCHIATRY: No anxiety or depression.   MEDICATIONS AT HOME:  Prior to Admission medications   Medication Sig Start Date End Date Taking? Authorizing Provider  budesonide-formoterol (SYMBICORT) 160-4.5 MCG/ACT inhaler Inhale 2 puffs into the lungs daily. Patient not  taking: Reported on 02/20/2018 11/16/16   Vaughan Basta, MD  calcium acetate (PHOSLO) 667 MG capsule Take 2,001 mg by mouth 3 (three) times daily.    [provider]  folic acid (FOLVITE) 1 MG tablet Take 1 tablet (1 mg total) by mouth daily. Patient not taking: Reported on 02/20/2018 10/31/17   Fritzi Mandes, MD  gabapentin (NEURONTIN) 300 MG capsule Take 300 mg by mouth 3 (three) times daily.  08/16/16   [provider]  ipratropium-albuterol (DUONEB) 0.5-2.5 (3) MG/3ML SOLN Take 3 mLs by nebulization every 6 (six) hours as needed. Patient not taking: Reported on 02/20/2018 11/28/16   Loletha Grayer, MD  multivitamin (RENA-VIT) TABS tablet Take 1 tablet by mouth at bedtime. 10/31/17   Fritzi Mandes, MD  mupirocin ointment (BACTROBAN) 2 % Place 1 application into the nose 2 (two) times daily. Patient not taking: Reported on 02/20/2018 01/19/18   Vaughan Basta, MD  omeprazole (PRILOSEC) 40 MG capsule Take 40 mg by mouth daily. 09/08/17   [provider]  Spacer/Aero Chamber Mouthpiece MISC 1 Units by Does not apply route every 4 (four) hours as needed (wheezing). Patient not taking: Reported on 02/20/2018 02/19/17   Darel Hong, MD  tiotropium (SPIRIVA HANDIHALER) 18 MCG inhalation capsule Place 1 capsule (18 mcg total) into inhaler and inhale daily. Patient not taking: Reported on 02/20/2018 01/30/18 04/30/18  Demetrios Loll, MD      PHYSICAL EXAMINATION:   VITAL SIGNS: Blood pressure (!) 145/90, pulse 87, temperature 97.8 F (36.6 C), temperature source Oral, resp. rate 20, height 6\' 3"  (1.905 m), weight 82.7 kg, SpO2 97 %.  GENERAL:  58 y.o.-year-old patient lying in the bed with no acute distress.  EYES: Pupils equal, round, reactive to light and accommodation. No scleral icterus. Extraocular muscles intact.  HEENT: Head atraumatic, normocephalic. Oropharynx and nasopharynx clear.  NECK:  Supple, no jugular venous distention. No thyroid enlargement, no  tenderness.  LUNGS: Bilateral crackles at the base. No use of accessory muscles of respiration.  CARDIOVASCULAR: S1, S2 normal. No murmurs, rubs, or gallops.  ABDOMEN: Soft, nontender, nondistended. Bowel sounds present. No organomegaly or mass.  EXTREMITIES: No pedal edema, cyanosis, or clubbing.  NEUROLOGIC: Cranial nerves II through XII are intact. Muscle strength 5/5 in all extremities. Sensation intact. Gait not checked.  PSYCHIATRIC: The patient is alert and oriented x 3.  SKIN: No obvious rash, lesion, or ulcer.   LABORATORY PANEL:   CBC Recent Labs  Lab 03/26/18 1451  WBC 5.8  HGB 10.9*  HCT 38.0*  PLT 307  MCV 84.6  MCH 24.3*  MCHC 28.7*  RDW 18.4*   ------------------------------------------------------------------------------------------------------------------  Chemistries  Recent Labs  Lab 03/20/18 0700 03/26/18 1451  NA  --  136  K 6.7* 6.6*  CL  --  90*  CO2  --  33*  GLUCOSE  --  113*  BUN  --  53*  CREATININE  --  13.29*  CALCIUM 7.9*  8.1*  AST  --  19  ALT  --  13  ALKPHOS  --  114  BILITOT  --  0.7   ------------------------------------------------------------------------------------------------------------------ estimated creatinine clearance is 7.1 mL/min (A) (by C-G formula based on SCr of 13.29 mg/dL (H)). ------------------------------------------------------------------------------------------------------------------ No results for input(s): TSH, T4TOTAL, T3FREE, THYROIDAB in the last 72 hours.  Invalid input(s): FREET3   Coagulation profile No results for input(s): INR, PROTIME in the last 168 hours. ------------------------------------------------------------------------------------------------------------------- No results for input(s): DDIMER in the last 72 hours. -------------------------------------------------------------------------------------------------------------------  Cardiac Enzymes Recent Labs  Lab 03/26/18 1451   TROPONINI 0.07*   ------------------------------------------------------------------------------------------------------------------ Invalid input(s): POCBNP  ---------------------------------------------------------------------------------------------------------------  Urinalysis    Component Value Date/Time   COLORURINE YELLOW (A) 05/03/2015 0815   APPEARANCEUR CLEAR (A) 05/03/2015 0815   APPEARANCEUR Clear 11/13/2013 1314   LABSPEC 1.008 05/03/2015 0815   LABSPEC 1.014 11/13/2013 1314   PHURINE 9.0 (H) 05/03/2015 0815   GLUCOSEU NEGATIVE 05/03/2015 0815   GLUCOSEU 50 mg/dL 11/13/2013 1314   HGBUR NEGATIVE 05/03/2015 0815   BILIRUBINUR NEGATIVE 05/03/2015 0815   BILIRUBINUR Negative 11/13/2013 1314   KETONESUR NEGATIVE 05/03/2015 0815   PROTEINUR 100 (A) 05/03/2015 0815   UROBILINOGEN 0.2 06/22/2014 1230   NITRITE NEGATIVE 05/03/2015 0815   LEUKOCYTESUR NEGATIVE 05/03/2015 0815   LEUKOCYTESUR Negative 11/13/2013 1314     RADIOLOGY: Dg Chest 2 View  Result Date: 03/26/2018 CLINICAL DATA:  Shortness of breath and weakness. EXAM: CHEST - 2 VIEW COMPARISON:  Chest x-ray dated March 13, 2018. FINDINGS: Unchanged tunneled right internal jugular dialysis catheter. Stable cardiomegaly status post CABG. Mildly progressed diffuse interstitial thickening. No focal consolidation, pleural effusion, or pneumothorax. No acute osseous abnormality. IMPRESSION: 1. Mildly progressed interstitial pulmonary edema. Electronically Signed   By: Titus Dubin M.D.   On: 03/26/2018 15:25    EKG: Orders placed or performed during the hospital encounter of 03/26/18  . ED EKG within 10 minutes  . ED EKG within 10 minutes  . EKG 12-Lead  . EKG 12-Lead    IMPRESSION AND PLAN: Patient is 58 year old well-known to our service with medical noncompliance presenting with shortness of breath  1.  Shortness of breath due to patient missing his hemodialysis Is being dialyzed today and will need to  have dialysis again tomorrow Patient also has acute bronchitis we will treat with oral antibiotics Place patient on nebs  2.  Hyperkalemia is received treatment for potassium he will need dialysis  3.  History of COPD will use nebs as needed  4.  Neuropathy continue gabapentin  5.  GERD continue proton   All the records are reviewed and case discussed with ED provider. Management plans discussed with the patient, family and they are in agreement.  CODE STATUS: Code Status History    Date Active Date Inactive Code Status Order ID Comments User Context   01/30/2018 0122 01/30/2018 2116 Full Code [id number]  Vaughan Basta, MD Inpatient   01/15/2018 1437 01/19/2018 1251 Full Code [id number]  Vaughan Basta, MD Inpatient   12/23/2017 2225 12/26/2017 1846 Full Code [id number]  Amelia Jo, MD Inpatient   12/09/2017 1043 12/11/2017 1617 Full Code [id number]  Epifanio Lesches, MD ED   11/27/2017 1122 11/28/2017 1827 Full Code [id number]  Demetrios Loll, MD Inpatient   11/18/2017 2008 11/20/2017 1853 Full Code [id number]  Saundra Shelling, MD Inpatient   10/30/2017 0007 10/31/2017 1724 Full Code [id number]  Amelia Jo, MD Inpatient   06/12/2017 1707 06/16/2017 1704 Full Code [id number]  Nicholes Mango, MD Inpatient  06/02/2017 1603 06/06/2017 2222 Full Code [id number]  Demetrios Loll, MD Inpatient   05/24/2017 1831 05/25/2017 1921 Full Code [id number]  Saundra Shelling, MD ED   05/03/2017 1331 05/05/2017 1806 Full Code [id number]  Bettey Costa, MD Inpatient   04/01/2017 0947 04/03/2017 1156 Full Code [id number]  Loletha Grayer, MD ED   03/09/2017 0010 03/09/2017 1928 Full Code [id number]  Lance Coon, MD Inpatient   03/03/2017 2027 03/04/2017 1920 Full Code [id number]  Demetrios Loll, MD Inpatient   02/28/2017 2254 03/02/2017 1519 Full Code [id number]  Lance Coon, MD Inpatient   02/22/2017 1721 02/24/2017 1359 Full Code [id number]  Dustin Flock, MD Inpatient   01/22/2017 2337 01/25/2017 1522 Full Code [id number]   Gorden Harms, MD Inpatient   01/07/2017 1445 01/13/2017 1812 Full Code [id number]  Idelle Crouch, MD Inpatient   12/22/2016 1431 12/24/2016 1804 Full Code [id number]  Hillary Bow, MD ED   12/20/2016 2020 12/21/2016 1853 Full Code [id number]  Demetrios Loll, MD Inpatient   11/26/2016 1030 11/28/2016 1522 Full Code [id number]  Henreitta Leber, MD Inpatient   11/15/2016 1930 11/16/2016 1822 Full Code [id number]  Fritzi Mandes, MD Inpatient   10/29/2016 1453 10/30/2016 1506 Full Code [id number]  Loletha Grayer, MD ED   09/15/2016 1757 09/16/2016 1817 Full Code [id number]  Dustin Flock, MD ED   08/16/2016 0945 08/18/2016 1816 Full Code [id number]  Hillary Bow, MD ED   08/09/2016 1659 08/12/2016 2034 Full Code [id number]  Fritzi Mandes, MD Inpatient   07/03/2016 2154 07/05/2016 2122 Full Code [id number]  Demetrios Loll, MD Inpatient   06/24/2016 0423 06/26/2016 1546 Full Code [id number]  Saundra Shelling, MD Inpatient   06/14/2016 1453 06/19/2016 2104 Full Code [id number]  Hillary Bow, MD ED   05/31/2016 0642 06/01/2016 2130 Full Code [id number]  Saundra Shelling, MD Inpatient   05/05/2016 2309 05/10/2016 2157 Full Code [id number]  Vianne Bulls, MD Inpatient   04/16/2016 0356 04/17/2016 1827 Full Code [id number]  Saundra Shelling, MD Inpatient   01/19/2016 1513 01/22/2016 1414 Full Code [id number]  Loletha Grayer, MD ED   09/24/2015 1033 09/25/2015 1331 Full Code [id number]  Loletha Grayer, MD ED   05/16/2015 1417 05/17/2015 2057 Full Code [id number]  Idelle Crouch, MD Inpatient   04/30/2015 2008 05/08/2015 1940 Full Code [id number]  Vaughan Basta, MD Inpatient   04/06/2015 0507 04/07/2015 1736 Full Code [id number]  Lance Coon, MD Inpatient   03/23/2015 1107 03/27/2015 1246 Full Code [id number]  Bettey Costa, MD Inpatient   06/19/2013 1132 06/21/2013 1749 Full Code [id number]  Cristal Ford, DO Inpatient   06/16/2013 1726 06/19/2013 1132 Full Code [id number]  Margarita Mail, PA-C ED   06/05/2013 1533 06/06/2013 1856 Full Code [id number]  Theodoro Kos, DO Inpatient   06/02/2013 0901 06/05/2013 1533 Full Code [id number]  Velvet Bathe, MD Inpatient   06/02/2013 0840 06/02/2013 0901 Full Code [id number]  Velvet Bathe, MD Inpatient       TOTAL TIME TAKING CARE OF THIS PATIENT:55 minutes.    Dustin Flock M.D on 03/26/2018 at 5:06 PM  Between 7am to 6pm - Pager - [phone]  After 6pm go to [email or web address] - password [email or web address]  [facility] Office  [phone]  CC: Primary care physician; [name], MD

## 2018-03-26 NOTE — Progress Notes (Signed)
Pre HD assessment   03/26/18 1647  Vital Signs  Temp 97.8 F (36.6 C)  Temp Source Oral  Pulse Rate 86  Pulse Rate Source Monitor  Resp 16  BP (!) 136/91  BP Location Left Arm  BP Method Automatic  Patient Position (if appropriate) Sitting  Oxygen Therapy  SpO2 94 %  O2 Device Room Air  Pain Assessment  Pain Scale 0-10  Pain Score 0  Dialysis Weight  Weight 82.7 kg  Type of Weight Pre-Dialysis  Time-Out for Hemodialysis  What Procedure? HD  Pt Identifiers(min of two) First/Last Name;MRN/Account#  Correct Site? Yes  Correct Side? Yes  Correct Procedure? Yes  Consents Verified? Yes  Rad Studies Available? N/A  Safety Precautions Reviewed? Yes  Engineer, civil (consulting) Number  (5A)  Station Number 3  UF/Alarm Test Passed  Conductivity: Meter 13.6  Conductivity: Machine  13.7  pH 7.6  Reverse Osmosis main  Normal Saline Lot Number [id number]  Dialyzer Lot Number [id number]  Disposable Set Lot Number [id number]  Machine Temperature 98.6 F (37 C)  Musician and Audible Yes  Blood Lines Intact and Secured Yes  Pre Treatment Patient Checks  Vascular access used during treatment Catheter  Patient is receiving dialysis in a chair Yes  Hepatitis B Surface Antigen Results Negative  Date Hepatitis B Surface Antigen Drawn 06/16/17  Hepatitis B Surface Antibody 50.3 (<10)  Date Hepatitis B Surface Antibody Drawn 06/16/17  Hemodialysis Consent Verified Yes  Hemodialysis Standing Orders Initiated Yes  ECG (Telemetry) Monitor On Yes  Prime Ordered Normal Saline  Length of  DialysisTreatment -hour(s) 3.5 Hour(s)  Dialyzer Elisio 17H NR  Dialysate 1K (1K for 1 hr and 2K for rest of HD tx per MD orders)  Dialysis Anticoagulant None  Dialysate Flow Ordered 800  Blood Flow Rate Ordered 400 mL/min  Ultrafiltration Goal 2.5 Liters  Pre Treatment Labs Phosphorus;Other (Comment) (PTH)  Dialysis Blood Pressure Support Ordered Normal Saline  Education / Care Plan  Dialysis  Education Provided Yes  Documented Education in Care Plan Yes  Hemodialysis Catheter Right Internal jugular Double-lumen  No Placement Date or Time found.   Placed prior to admission: Yes  Orientation: Right  Access Location: Internal jugular  Hemodialysis Catheter Type: Double-lumen  Site Condition No complications  Blue Lumen Status Heparin locked  Red Lumen Status Heparin locked  Purple Lumen Status N/A  Dressing Type Gauze/Drain sponge  Dressing Status Clean;Dry;Intact  Drainage Description None  Dressing Change Due 03/26/18

## 2018-03-26 NOTE — Progress Notes (Signed)
HD tx end    03/26/18 2030  Vital Signs  Pulse Rate 85  Pulse Rate Source Monitor  Resp 16  BP 136/90  BP Location Left Arm  BP Method Automatic  Patient Position (if appropriate) Lying  Oxygen Therapy  SpO2 98 %  O2 Device Room Air  During Hemodialysis Assessment  Dialysis Fluid Bolus Normal Saline  Bolus Amount (mL) 250 mL  Intra-Hemodialysis Comments Tx completed

## 2018-03-26 NOTE — ED Notes (Addendum)
Pt has IV

## 2018-03-26 NOTE — ED Notes (Signed)
ED TO INPATIENT HANDOFF REPORT  Name/Age/Gender Stanley [name] 58 y.o. male  Code Status Code Status History    Date Active Date Inactive Code Status Order ID Comments User Context   01/30/2018 0122 01/30/2018 2116 Full Code [id number]  Vaughan Basta, MD Inpatient   01/15/2018 1437 01/19/2018 1251 Full Code [id number]  Vaughan Basta, MD Inpatient   12/23/2017 2225 12/26/2017 1846 Full Code [id number]  Amelia Jo, MD Inpatient   12/09/2017 1043 12/11/2017 1617 Full Code [id number]  Epifanio Lesches, MD ED   11/27/2017 1122 11/28/2017 1827 Full Code [id number]  Demetrios Loll, MD Inpatient   11/18/2017 2008 11/20/2017 1853 Full Code [id number]  Saundra Shelling, MD Inpatient   10/30/2017 0007 10/31/2017 1724 Full Code [id number]  Amelia Jo, MD Inpatient   06/12/2017 1707 06/16/2017 1704 Full Code [id number]  Nicholes Mango, MD Inpatient   06/02/2017 1603 06/06/2017 2222 Full Code [id number]  Demetrios Loll, MD Inpatient   05/24/2017 1831 05/25/2017 1921 Full Code [id number]  Saundra Shelling, MD ED   05/03/2017 1331 05/05/2017 1806 Full Code [id number]  Bettey Costa, MD Inpatient   04/01/2017 0947 04/03/2017 1156 Full Code [id number]  Loletha Grayer, MD ED   03/09/2017 0010 03/09/2017 1928 Full Code [id number]  Lance Coon, MD Inpatient   03/03/2017 2027 03/04/2017 1920 Full Code [id number]  Demetrios Loll, MD Inpatient   02/28/2017 2254 03/02/2017 1519 Full Code [id number]  Lance Coon, MD Inpatient   02/22/2017 1721 02/24/2017 1359 Full Code [id number]  Dustin Flock, MD Inpatient   01/22/2017 2337 01/25/2017 1522 Full Code [id number]  Gorden Harms, MD Inpatient   01/07/2017 1445 01/13/2017 1812 Full Code [id number]  Idelle Crouch, MD Inpatient   12/22/2016 1431 12/24/2016 1804 Full Code [id number]  Hillary Bow, MD ED   12/20/2016 2020 12/21/2016 1853 Full Code [id number]  Demetrios Loll, MD Inpatient   11/26/2016 1030 11/28/2016 1522 Full Code [id number]  Henreitta Leber, MD Inpatient   11/15/2016 1930  11/16/2016 1822 Full Code [id number]  Fritzi Mandes, MD Inpatient   10/29/2016 1453 10/30/2016 1506 Full Code [id number]  Loletha Grayer, MD ED   09/15/2016 1757 09/16/2016 1817 Full Code [id number]  Dustin Flock, MD ED   08/16/2016 0945 08/18/2016 1816 Full Code [id number]  Hillary Bow, MD ED   08/09/2016 1659 08/12/2016 2034 Full Code [id number]  Fritzi Mandes, MD Inpatient   07/03/2016 2154 07/05/2016 2122 Full Code [id number]  Demetrios Loll, MD Inpatient   06/24/2016 0423 06/26/2016 1546 Full Code [id number]  Saundra Shelling, MD Inpatient   06/14/2016 1453 06/19/2016 2104 Full Code [id number]  Hillary Bow, MD ED   05/31/2016 0642 06/01/2016 2130 Full Code [id number]  Saundra Shelling, MD Inpatient   05/05/2016 2309 05/10/2016 2157 Full Code [id number]  Vianne Bulls, MD Inpatient   04/16/2016 0356 04/17/2016 1827 Full Code [id number]  Saundra Shelling, MD Inpatient   01/19/2016 1513 01/22/2016 1414 Full Code [id number]  Loletha Grayer, MD ED   09/24/2015 1033 09/25/2015 1331 Full Code [id number]  Loletha Grayer, MD ED   05/16/2015 1417 05/17/2015 2057 Full Code [id number]  Idelle Crouch, MD Inpatient   04/30/2015 2008 05/08/2015 1940 Full Code [id number]  Vaughan Basta, MD Inpatient   04/06/2015 0507 04/07/2015 1736 Full Code [id number]  Lance Coon, MD Inpatient   03/23/2015 1107 03/27/2015 1246 Full Code [id number]  Bettey Costa, MD Inpatient   06/19/2013 1132 06/21/2013 1749 Full Code [id number]  Cristal Ford, DO Inpatient   06/16/2013 1726 06/19/2013 1132 Full Code [id number]  Margarita Mail, PA-C ED  06/05/2013 1533 06/06/2013 1856 Full Code [id number]  Theodoro Kos, DO Inpatient   06/02/2013 0901 06/05/2013 1533 Full Code [id number]  Velvet Bathe, MD Inpatient   06/02/2013 0840 06/02/2013 0901 Full Code [id number]  Velvet Bathe, MD Inpatient      Home/SNF/Other home  Chief Complaint Hyperkalemia [E87.5] Acute pulmonary edema ([place]) [J81.0] Noncompliance of patient with renal dialysis ([place]) [Z91.15] Congestive heart failure,  unspecified HF chronicity, unspecified heart failure type ([place]) [I50.9]  Level of Care/Admitting Diagnosis ED Disposition    ED Disposition Condition Comment   Admit  The patient appears reasonably stabilized for admission considering the current resources, flow, and capabilities available in the ED at this time, and I doubt any other [facility] requiring further screening and/or treatment in the ED prior to admission is  present.       Medical History Past Medical History:  Diagnosis Date  . Alcohol abuse   . CHF (congestive heart failure) ([place])   . Cirrhosis ([place])   . Coronary artery disease 2009  . Drug abuse ([place])   . End stage renal disease on dialysis [facility]) NEPHROLOGIST-   DR [facility]  IN [place]   HEMODIALYSIS --   TUES/  THURS/  SAT  . Gastrointestinal bleed 06/13/2017   From chart...hx of multiple GI bleeds  . GERD (gastroesophageal reflux disease)   . Hyperlipidemia   . Hypertension   . PAD (peripheral artery disease) ([place])   . Renal insufficiency    Per pt, 32 oz fluid restriction per day  . S/P triple vessel bypass 06/09/2016   2009ish  . Suicidal ideation    & HOMICIDAL IDEATION --  06-16-2013   ADMITTED TO BEHAVIOR HEALTH    Allergies No Known Allergies  IV Location/Drains/Wounds Patient Lines/Drains/Airways Status   Active Line/Drains/Airways    Name:   Placement date:   Placement time:   Site:   Days:   Peripheral IV 03/26/18 Left Forearm   03/26/18    1452    Forearm   less than 1   Fistula / Graft Right Upper arm Arteriovenous fistula   -    -    Upper arm      Hemodialysis Catheter Right Internal jugular Double-lumen   -    -    Internal jugular      Incision (Closed) 01/17/18 Abdomen Right   01/17/18    1707     68          Labs/Imaging Results for orders placed or performed during the hospital encounter of 03/26/18 (from the past 48 hour(s))  Brain natriuretic peptide     Status: Abnormal   Collection Time: 03/26/18  2:42 PM  Result Value Ref Range    B Natriuretic Peptide 2,891.0 (H) 0.0 - 100.0 pg/mL    Comment: Performed at [facility], [address] [place], [place] [address]  Basic metabolic panel     Status: Abnormal   Collection Time: 03/26/18  2:51 PM  Result Value Ref Range   Sodium 136 135 - 145 mmol/L   Potassium 6.6 (HH) 3.5 - 5.1 mmol/L    Comment: CRITICAL RESULT CALLED TO, READ BACK BY AND VERIFIED WITH TOM NAGY AT 3818 03/26/2018.  TFK    Chloride 90 (L) 98 - 111 mmol/L   CO2 33 (H) 22 - 32 mmol/L   Glucose, Bld 113 (H) 70 - 99 mg/dL   BUN 53 (H) 6 - 20 mg/dL   Creatinine, Ser 13.29 (H) 0.61 - 1.24  mg/dL   Calcium 8.1 (L) 8.9 - 10.3 mg/dL   GFR calc non Af Amer 4 (L) >60 mL/min   GFR calc Af Amer 4 (L) >60 mL/min   Anion gap 13 5 - 15    Comment: Performed at [facility], [address] [place], [place] [address]  CBC     Status: Abnormal   Collection Time: 03/26/18  2:51 PM  Result Value Ref Range   WBC 5.8 4.0 - 10.5 K/uL   RBC 4.49 4.22 - 5.81 MIL/uL   Hemoglobin 10.9 (L) 13.0 - 17.0 g/dL   HCT 38.0 (L) 39.0 - 52.0 %   MCV 84.6 80.0 - 100.0 fL   MCH 24.3 (L) 26.0 - 34.0 pg   MCHC 28.7 (L) 30.0 - 36.0 g/dL   RDW 18.4 (H) 11.5 - 15.5 %   Platelets 307 150 - 400 K/uL   nRBC 0.0 0.0 - 0.2 %    Comment: Performed at [facility], [address] [place], [place] [address]  Troponin I - ONCE - STAT     Status: Abnormal   Collection Time: 03/26/18  2:51 PM  Result Value Ref Range   Troponin I 0.07 (HH) <0.03 ng/mL    Comment: CRITICAL RESULT CALLED TO, READ BACK BY AND VERIFIED WITH TOM NAGY AT 3474 03/26/2018.  TFK Performed at [facility], [address] [place], [place] [address]   Lipase, blood     Status: None   Collection Time: 03/26/18  2:51 PM  Result Value Ref Range   Lipase 21 11 - 51 U/L    Comment: Performed at [facility], [address] [place], [place] [address]  Hepatic function panel     Status: Abnormal   Collection Time:  03/26/18  2:51 PM  Result Value Ref Range   Total Protein 6.5 6.5 - 8.1 g/dL   Albumin 2.7 (L) 3.5 - 5.0 g/dL   AST 19 15 - 41 U/L   ALT 13 0 - 44 U/L   Alkaline Phosphatase 114 38 - 126 U/L   Total Bilirubin 0.7 0.3 - 1.2 mg/dL   Bilirubin, Direct 0.2 0.0 - 0.2 mg/dL   Indirect Bilirubin 0.5 0.3 - 0.9 mg/dL    Comment: Performed at [facility], [address] [place], [place] [address]  Influenza panel by PCR (type A & B)     Status: None   Collection Time: 03/26/18  3:10 PM  Result Value Ref Range   Influenza A By PCR NEGATIVE NEGATIVE   Influenza B By PCR NEGATIVE NEGATIVE    Comment: (NOTE) The Xpert Xpress Flu assay is intended as an aid in the diagnosis of  influenza and should not be used as a sole basis for treatment.  This  assay is FDA approved for nasopharyngeal swab specimens only. Nasal  washings and aspirates are unacceptable for Xpert Xpress Flu testing. Performed at [facility], [address] [place], [place] [address]   Phosphorus     Status: Abnormal   Collection Time: 03/26/18  5:20 PM  Result Value Ref Range   Phosphorus 7.0 (H) 2.5 - 4.6 mg/dL    Comment: Performed at [facility], [address], [place], [place] [address]   Dg Chest 2 View  Result Date: 03/26/2018 CLINICAL DATA:  Shortness of breath and weakness. EXAM: CHEST - 2 VIEW COMPARISON:  Chest x-ray dated March 13, 2018. FINDINGS: Unchanged tunneled right internal jugular dialysis catheter. Stable  cardiomegaly status post CABG. Mildly progressed diffuse interstitial thickening. No focal consolidation, pleural effusion, or pneumothorax. No acute osseous abnormality. IMPRESSION: 1. Mildly progressed interstitial pulmonary edema. Electronically Signed   By: Titus Dubin M.D.   On: 03/26/2018 15:25    Pending Labs Unresulted Labs (From admission, onward)    Start     Ordered   03/26/18 1614  Parathyroid hormone, intact (no Ca)  Once,   STAT     03/26/18 1613    Signed and Held  CBC  (heparin)  Once,   R    Comments:  Baseline for heparin therapy IF NOT ALREADY DRAWN.  Notify MD if PLT < 100 K.    Signed and Held   Signed and Held  Creatinine, serum  (heparin)  Once,   R    Comments:  Baseline for heparin therapy IF NOT ALREADY DRAWN.    Signed and Held   Signed and Held  Troponin I - Now Then Q6H  Now then every 6 hours,   R     Signed and Held   Signed and Held  CBC  Tomorrow morning,   R     Signed and Held   Signed and Held  Basic metabolic panel  Tomorrow morning,   R     Signed and Held          Vitals/Pain Today's Vitals   03/26/18 2030 03/26/18 2039 03/26/18 2043 03/26/18 2120  BP: 136/90 134/82  122/64  Pulse: 85 81 83   Resp: 16 18  18   Temp:  98.3 F (36.8 C)    TempSrc:  Oral    SpO2: 98% 99% 95%   Weight:  80.5 kg    Height:      PainSc:        Isolation Precautions Droplet precaution  Medications Medications  insulin aspart (novoLOG) injection 10 Units (has no administration in time range)  dextrose 50 % solution 50 mL (has no administration in time range)  doxycycline (VIBRA-TABS) tablet 100 mg (100 mg Oral Given 03/26/18 2157)  ipratropium-albuterol (DUONEB) 0.5-2.5 (3) MG/3ML nebulizer solution 3 mL (3 mLs Nebulization Given 03/26/18 2155)  ondansetron (ZOFRAN) injection 4 mg (4 mg Intravenous Given 03/26/18 1504)  calcium gluconate 1 g/ 50 mL sodium chloride IVPB (1,000 mg Intravenous New Bag/Given 03/26/18 1544)  alum & mag hydroxide-simeth (MAALOX/MYLANTA) 200-200-20 MG/5ML suspension 30 mL (30 mLs Oral Given 03/26/18 1629)  diphenhydrAMINE (BENADRYL) injection 12.5 mg (12.5 mg Intravenous Given 03/26/18 1745)    Mobility independent

## 2018-03-26 NOTE — Progress Notes (Signed)
Advanced care plan.  Purpose of the Encounter: CODE STATUS  Parties in Attendance: Patient himself  Patient's Decision Capacity: Intact  Subjective/Patient's story: Stanley [name]  is a 58 y.o. male with a known history of alcohol abuse, congestive heart failure, liver cirrhosis, coronary artery disease, drug abuse, end-stage renal disease on hemodialysis who has been noncompliant with his dialysis.    Objective/Medical story  Recurrent admissions and medical noncompliance I discussed with the patient regarding his desires for cardiac and pulmonary resuscitation  Goals of care determination:  Patient states that he would like to be a full code   CODE STATUS:  Full code  Time spent discussing advanced care planning: 16 minutes

## 2018-03-26 NOTE — ED Provider Notes (Signed)
Stanley [name]  I have reviewed the triage vital signs and the nursing notes.   HISTORY  Chief Complaint Shortness of Breath and Weakness    HPI Stanley [name] is a 58 y.o. male with a history of ESRD on HD, liver cirrhosis, CAD status post remote CABG, COPD, presenting for shortness of breath.  The patient reports that his last dialysis was 6 days ago.  He missed Thursday and Saturday dialysis due to sleeping through his scheduled time.  He reports that for the past 3 weeks he has been having cough with mild congestion.  He reports 1 to 2 days of nausea and vomiting.  No abdominal pain or diarrhea.;  No constipation.  No fevers or chills.  No problems with his left upper chest PermCath.  I have reviewed the patient's chart, and it appears that he routinely comes through the emergency department for dialysis.  Past Medical History:  Diagnosis Date  . Alcohol abuse   . CHF (congestive heart failure) ([place])   . Cirrhosis ([place])   . Coronary artery disease 2009  . Drug abuse ([place])   . End stage renal disease on dialysis [facility]) NEPHROLOGIST-   DR [facility]  IN [place]   HEMODIALYSIS --   TUES/  THURS/  SAT  . Gastrointestinal bleed 06/13/2017   From chart...hx of multiple GI bleeds  . GERD (gastroesophageal reflux disease)   . Hyperlipidemia   . Hypertension   . PAD (peripheral artery disease) ([place])   . Renal insufficiency    Per pt, 32 oz fluid restriction per day  . S/P triple vessel bypass 06/09/2016   2009ish  . Suicidal ideation    & HOMICIDAL IDEATION --  06-16-2013   ADMITTED TO BEHAVIOR HEALTH    Patient Active Problem List   Diagnosis Date Noted  . Healthcare-associated pneumonia 01/15/2018  . Acute on chronic respiratory failure ([place]) 01/15/2018  . Acute pulmonary edema ([place]) 12/23/2017  . Acute respiratory failure ([place])  10/29/2017  . ESRD (end stage renal disease) on dialysis ([place]) 07/28/2017  . Protein-calorie malnutrition, severe 06/14/2017  . Encounter for dialysis [facility])   . Palliative care by specialist   . Goals of care, counseling/discussion   . Malnutrition of moderate degree 06/05/2017  . Secondary esophageal varices without bleeding ([place])   . Stomach irritation   . Idiopathic esophageal varices without bleeding ([place])   . Alcoholic hepatitis with ascites 05/24/2017  . ESRD (end stage renal disease) ([place]) 04/28/2017  . Uremia 03/08/2017  . ESRD on hemodialysis ([place]) 03/03/2017  . Weakness 02/28/2017  . Hypocalcemia 02/22/2017  . Shortness of breath 11/26/2016  . COPD (chronic obstructive pulmonary disease) ([place]) 10/30/2016  . COPD exacerbation ([place]) 10/29/2016  . Anemia   . Heme positive stool   . Ulceration of intestine   . Benign neoplasm of transverse colon   . Acute gastrointestinal hemorrhage   . Esophageal candidiasis ([place])   . Angiodysplasia of intestinal tract   . Acute respiratory failure with hypoxia ([place]) 07/03/2016  . GI bleeding 06/24/2016  . Rectal bleeding 06/14/2016  . Anemia of chronic disease 06/01/2016  . MRSA carrier 06/01/2016  . Chronic renal failure 05/23/2016  . Ischemic heart disease 05/23/2016  . Angiodysplasia of small intestine   . Melena   . Small bowel bleed not requiring more than 4 units of blood in 24 hours, ICU, or surgery   . Anemia due to  chronic blood loss   . Abdominal pain 05/05/2016  . Acute posthemorrhagic anemia 04/17/2016  . Gastrointestinal bleed 04/17/2016  . History of esophagogastroduodenoscopy (EGD) 04/17/2016  . Elevated troponin 04/17/2016  . Alcohol abuse 04/17/2016  . Upper GI bleed 01/19/2016  . Blood in stool   . Angiodysplasia of stomach and duodenum with hemorrhage   . Gastritis   . Reflux esophagitis   . GI bleed 05/16/2015  . Acute GI bleeding   . Symptomatic anemia 04/30/2015  . HTN (hypertension) 04/06/2015  . GERD  (gastroesophageal reflux disease) 04/06/2015  . HLD (hyperlipidemia) 04/06/2015  . Dyspnea 04/06/2015  . Cirrhosis of liver with ascites ([place]) 04/06/2015  . Ascites 04/06/2015  . GIB (gastrointestinal bleeding) 03/23/2015  . Homicidal ideation 06/19/2013  . Suicidal intent 06/19/2013  . Homicidal ideations 06/19/2013  . Hyperkalemia 06/16/2013  . Mandible fracture ([place]) 06/05/2013  . Fracture, mandible ([place]) 06/02/2013  . Coronary atherosclerosis of native coronary artery 06/02/2013  . ESRD on dialysis ([place]) 06/02/2013  . Mandible open fracture ([place]) 06/02/2013    Past Surgical History:  Procedure Laterality Date  . A/V FISTULAGRAM Right 06/06/2017   Procedure: A/V FISTULAGRAM;  Surgeon: Katha Cabal, MD;  Location: [facility];  Service: Cardiovascular;  Laterality: Right;  . A/V SHUNT INTERVENTION N/A 06/06/2017   Procedure: A/V SHUNT INTERVENTION;  Surgeon: Katha Cabal, MD;  Location: [facility];  Service: Cardiovascular;  Laterality: N/A;  . AGILE CAPSULE N/A 06/19/2016   Procedure: AGILE CAPSULE;  Surgeon: [name], MD;  Location: [facility] ENDOSCOPY;  Service: Endoscopy;  Laterality: N/A;  . COLONOSCOPY WITH PROPOFOL N/A 06/18/2016   Procedure: COLONOSCOPY WITH PROPOFOL;  Surgeon: [name], MD;  Location: [facility] ENDOSCOPY;  Service: Endoscopy;  Laterality: N/A;  . COLONOSCOPY WITH PROPOFOL N/A 08/12/2016   Procedure: COLONOSCOPY WITH PROPOFOL;  Surgeon: [name], MD;  Location: [facility] ENDOSCOPY;  Service: Endoscopy;  Laterality: N/A;  . COLONOSCOPY WITH PROPOFOL N/A 05/05/2017   Procedure: COLONOSCOPY WITH PROPOFOL;  Surgeon: Manya Silvas, MD;  Location: [facility] ENDOSCOPY;  Service: Endoscopy;  Laterality: N/A;  . CORONARY ANGIOPLASTY  ?   PT UNABLE TO TELL IF  BEFORE OR AFTER  CABG  . CORONARY ARTERY BYPASS GRAFT  2008  ([place])   3 VESSEL  . DIALYSIS FISTULA CREATION  LAST SURGERY  APPOX  2008  . ENTEROSCOPY N/A 05/10/2016   Procedure:  ENTEROSCOPY;  Surgeon: Jerene Bears, MD;  Location: [facility];  Service: Gastroenterology;  Laterality: N/A;  . ENTEROSCOPY N/A 08/12/2016   Procedure: ENTEROSCOPY;  Surgeon: [name], MD;  Location: [facility] ENDOSCOPY;  Service: Endoscopy;  Laterality: N/A;  . ENTEROSCOPY Left 06/03/2017   Procedure: ENTEROSCOPY;  Surgeon: Virgel Manifold, MD;  Location: [facility] ENDOSCOPY;  Service: Endoscopy;  Laterality: Left;  Procedure date will ultimately depend on when patient is medically optimized before the procedure, pending hemodialysis and blood transfusions etc. Will place on schedule and change depending on clinical status.   . ENTEROSCOPY N/A 06/05/2017   Procedure: ENTEROSCOPY;  Surgeon: Virgel Manifold, MD;  Location: [facility] ENDOSCOPY;  Service: Endoscopy;  Laterality: N/A;  . ENTEROSCOPY N/A 06/15/2017   Procedure: Push ENTEROSCOPY;  Surgeon: [name], MD;  Location: [facility] ENDOSCOPY;  Service: Endoscopy;  Laterality: N/A;  . ESOPHAGOGASTRODUODENOSCOPY N/A 05/07/2015   Procedure: ESOPHAGOGASTRODUODENOSCOPY (EGD);  Surgeon: Hulen Luster, MD;  Location: [facility] ENDOSCOPY;  Service: Endoscopy;  Laterality: N/A;  . ESOPHAGOGASTRODUODENOSCOPY (EGD) WITH PROPOFOL N/A 05/17/2015  Procedure: ESOPHAGOGASTRODUODENOSCOPY (EGD) WITH PROPOFOL;  Surgeon: [name], MD;  Location: [facility] ENDOSCOPY;  Service: Endoscopy;  Laterality: N/A;  . ESOPHAGOGASTRODUODENOSCOPY (EGD) WITH PROPOFOL N/A 01/20/2016   Procedure: ESOPHAGOGASTRODUODENOSCOPY (EGD) WITH PROPOFOL;  Surgeon: [name], MD;  Location: [facility] ENDOSCOPY;  Service: Endoscopy;  Laterality: N/A;  . ESOPHAGOGASTRODUODENOSCOPY (EGD) WITH PROPOFOL N/A 04/17/2016   Procedure: ESOPHAGOGASTRODUODENOSCOPY (EGD) WITH PROPOFOL;  Surgeon: Lin Landsman, MD;  Location: [facility] ENDOSCOPY;  Service: Gastroenterology;  Laterality: N/A;  . ESOPHAGOGASTRODUODENOSCOPY (EGD) WITH PROPOFOL  05/09/2016   Procedure: ESOPHAGOGASTRODUODENOSCOPY (EGD) WITH PROPOFOL;  Surgeon: Jerene Bears, MD;  Location: [facility];  Service: Endoscopy;;  . ESOPHAGOGASTRODUODENOSCOPY (EGD) WITH PROPOFOL N/A 06/16/2016   Procedure: ESOPHAGOGASTRODUODENOSCOPY (EGD) WITH PROPOFOL;  Surgeon: [name], MD;  Location: [facility] ENDOSCOPY;  Service: Endoscopy;  Laterality: N/A;  . ESOPHAGOGASTRODUODENOSCOPY (EGD) WITH PROPOFOL N/A 05/05/2017   Procedure: ESOPHAGOGASTRODUODENOSCOPY (EGD) WITH PROPOFOL;  Surgeon: Manya Silvas, MD;  Location: [facility] ENDOSCOPY;  Service: Endoscopy;  Laterality: N/A;  . ESOPHAGOGASTRODUODENOSCOPY (EGD) WITH PROPOFOL N/A 06/15/2017   Procedure: ESOPHAGOGASTRODUODENOSCOPY (EGD) WITH PROPOFOL;  Surgeon: [name], MD;  Location: [facility] ENDOSCOPY;  Service: Endoscopy;  Laterality: N/A;  . GIVENS CAPSULE STUDY N/A 05/07/2016   Procedure: GIVENS CAPSULE STUDY;  Surgeon: Doran Stabler, MD;  Location: [facility];  Service: Endoscopy;  Laterality: N/A;  . MANDIBULAR HARDWARE REMOVAL N/A 07/29/2013   Procedure: REMOVAL OF ARCH BARS;  Surgeon: Theodoro Kos, DO;  Location: [facility];  Service: Plastics;  Laterality: N/A;  . ORIF MANDIBULAR FRACTURE N/A 06/05/2013   Procedure: REPAIR OF MANDIBULAR FRACTURE x 2 with maxillo-mandibular fixation ;  Surgeon: Theodoro Kos, DO;  Location: [place];  Service: Plastics;  Laterality: N/A;  . PARACENTESIS    . PERIPHERAL ARTERIAL STENT GRAFT Left     Current Outpatient Rx  . Order #: [id number] Class: Print  . Order #: [id number] Class: Historical Med  . Order #: [id number] Class: Normal  . Order #: [id number] Class: Historical Med  . Order #: [id number] Class: Print  . Order #: [id number] Class: Normal  . Order #: [id number] Class: Normal  . Order #: [id number] Class: Historical Med  . Order #: [id number] Class: Print  . Order #: [id number] Class: Print    Allergies Patient has no known allergies.  Family History  Problem Relation Age of Onset  . Colon cancer Mother   . Cancer Father   . Cancer Sister   . Kidney disease  Brother     Social History Social History   Tobacco Use  . Smoking status: Current Every Day Smoker    Packs/day: 0.15    Years: 40.00    Pack years: 6.00    Types: Cigarettes  . Smokeless tobacco: Never Used  Substance Use Topics  . Alcohol use: No    Comment: pt reports quitting after learning about cirrhosis  . Drug use: No    Frequency: 7.0 times per week    Types: Marijuana, Cocaine    Review of Systems Constitutional: No fever/chills.  No lightheadedness or syncope. Eyes: No visual changes. ENT: No sore throat. No congestion or rhinorrhea. Cardiovascular: Denies chest pain. Denies palpitations. Respiratory: Positive shortness of breath.  Positive cough. Gastrointestinal: No abdominal pain.  Positive nausea, positive vomiting.  No diarrhea.  No constipation. Genitourinary: Negative for dysuria. Musculoskeletal: Negative for back pain.  Skin: Negative for rash. Neurological: Negative for headaches. No focal numbness, tingling or weakness.     ____________________________________________   PHYSICAL EXAM:  VITAL SIGNS: ED Triage Vitals  Enc Vitals Group     BP 03/26/18 1438 131/83     Pulse Rate 03/26/18 1438 80     Resp 03/26/18 1438 (!) 22     Temp 03/26/18 1438 98.2 F (36.8 C)     Temp Source 03/26/18 1438 Oral     SpO2 03/26/18 1438 97 %     Weight 03/26/18 1439 182 lb 7 oz (82.8 kg)     Height 03/26/18 1439 6\' 3"  (1.905 m)     Head Circumference --      Peak Flow --      Pain Score 03/26/18 1438 8     Pain Loc --      Pain Edu? --      Excl. in [place]? --     Constitutional: Alert and oriented. Answers questions appropriately.  Chronically ill-appearing. Eyes: Conjunctivae are normal.  EOMI. positive scleral icterus. Head: Atraumatic. Nose: No congestion/rhinnorhea. Mouth/Throat: Mucous membranes are moist.  Neck: No stridor.  Supple.  As of JVD.  No meningismus. Cardiovascular: Normal rate, regular rhythm. No murmurs, rubs or gallops.  PermCath  in the right upper chest without any surrounding erythema, drainage or swelling. Respiratory: Normal respiratory effort.  No accessory muscle use or retractions. Lungs CTAB.  No wheezes, rales or ronchi. Gastrointestinal: Positive significant distention of the abdomen with fluid wave.  Soft, nontender.  No guarding or rebound.  No peritoneal signs. Musculoskeletal: No LE edema. No ttp in the calves or palpable cords.  Negative Homan's sign. Neurologic:  A&Ox3.  Speech is clear.  Face and smile are symmetric.  EOMI.  Moves all extremities well. Skin:  Skin is warm, dry and intact. No rash noted. Psychiatric: Mood and affect are normal. Speech and behavior are normal.  Normal judgement.  ____________________________________________   LABS (all labs ordered are listed, but only abnormal results are displayed)  Labs Reviewed  BASIC METABOLIC PANEL - Abnormal; Notable for the following components:      Result Value   Potassium 6.6 (*)    Chloride 90 (*)    CO2 33 (*)    Glucose, Bld 113 (*)    BUN 53 (*)    Creatinine, Ser 13.29 (*)    Calcium 8.1 (*)    GFR calc non Af Amer 4 (*)    GFR calc Af Amer 4 (*)    All other components within normal limits  CBC - Abnormal; Notable for the following components:   Hemoglobin 10.9 (*)    HCT 38.0 (*)    MCH 24.3 (*)    MCHC 28.7 (*)    RDW 18.4 (*)    All other components within normal limits  TROPONIN I - Abnormal; Notable for the following components:   Troponin I 0.07 (*)    All other components within normal limits  HEPATIC FUNCTION PANEL - Abnormal; Notable for the following components:   Albumin 2.7 (*)    All other components within normal limits  BRAIN NATRIURETIC PEPTIDE - Abnormal; Notable for the following components:   B Natriuretic Peptide 2,891.0 (*)    All other components within normal limits  LIPASE, BLOOD  INFLUENZA PANEL BY PCR (TYPE A & B)   ____________________________________________  EKG  ED ECG REPORT I,  Anne-Caroline Mariea Clonts, the attending physician, personally viewed and interpreted this ECG.   Date: 03/26/2018  EKG Time: 1456  Rate: 81  Rhythm: normal sinus rhythm  Axis: normal  Intervals:QRS 126 today which is borderline prolonged.  ST&T Change:  No STEMI  ____________________________________________  RADIOLOGY  Dg Chest 2 View  Result Date: 03/26/2018 CLINICAL DATA:  Shortness of breath and weakness. EXAM: CHEST - 2 VIEW COMPARISON:  Chest x-ray dated March 13, 2018. FINDINGS: Unchanged tunneled right internal jugular dialysis catheter. Stable cardiomegaly status post CABG. Mildly progressed diffuse interstitial thickening. No focal consolidation, pleural effusion, or pneumothorax. No acute osseous abnormality. IMPRESSION: 1. Mildly progressed interstitial pulmonary edema. Electronically Signed   By: Titus Dubin M.D.   On: 03/26/2018 15:25    ____________________________________________   PROCEDURES  Procedure(s) performed: None  Procedures  Critical Care performed: Yes ____________________________________________   INITIAL IMPRESSION / ASSESSMENT AND PLAN / ED COURSE  Pertinent labs & imaging results that were available during my care of the patient were reviewed by me and considered in my medical decision making (see chart for details).  58 y.o. male with history of ESRD on HD having missed several dialysis appointments, CAD status post CABG, COPD, liver cirrhosis, presenting with shortness of breath, cough and cold symptoms.  Overall, the patient is hemodynamically stable and is likely fluid overloaded from not having dialysis.  However, given his cough and cold symptoms now with nausea and vomiting, will do influenza testing.  I also want to see what his potassium results are.  He does not have any peaked T waves on his EKG, but his QRS is 126 today which is borderline prolonged.  Ordered a gram of calcium gluconate for him.  Plan reevaluation for final  disposition.  ----------------------------------------- 4:10 [name] on 03/26/2018 -----------------------------------------  The patient's work-up in the emergency department is consistent with sequelae of missing dialysis.  His chest x-ray shows pulmonary edema, and he has hyperkalemia with a potassium of 6.6.  He has been given calcium, insulin and glucose.  The patient will be slotted for dialysis very soon; additional treatment is not indicated at this time.  I have spoken with the nephrologist, who will plan to dialyze him.  From a cardiac standpoint, the patient does have an elevated troponin of 0.07 and a BNP of almost 3000.  These are within his baseline range and he is not having any new symptoms.  Plan admission to the hospitalist with emergent dialysis.   CRITICAL CARE Performed by: Eula Listen   Total critical care time: 35 minutes  Critical care time was exclusive of separately billable procedures and treating other patients.  Critical care was necessary to treat or prevent imminent or life-threatening deterioration.  Critical care was time spent personally by me on the following activities: development of treatment plan with patient and/or surrogate as well as nursing, discussions with consultants, evaluation of patient's response to treatment, examination of patient, obtaining history from patient or surrogate, ordering and performing treatments and interventions, ordering and review of laboratory studies, ordering and review of radiographic studies, pulse oximetry and re-evaluation of patient's condition.   ____________________________________________  FINAL CLINICAL IMPRESSION(S) / ED DIAGNOSES  Final diagnoses:  Hyperkalemia  Acute pulmonary edema (HCC)  Congestive heart failure, unspecified HF chronicity, unspecified heart failure type ([place])  Noncompliance of patient with renal dialysis ([place])         NEW MEDICATIONS STARTED DURING THIS VISIT:  New  Prescriptions   No medications on file      Eula Listen, MD 03/26/18 1612

## 2018-03-26 NOTE — Progress Notes (Signed)
Post HD assessment    03/26/18 2038  Neurological  Level of Consciousness Alert  Orientation Level Oriented X4  Respiratory  Respiratory Pattern Regular;Unlabored  Chest Assessment Chest expansion symmetrical  Cough Non-productive  Cardiac  Pulse Irregular  ECG Monitor Yes  Cardiac Rhythm NSR  Ectopy Unifocal PVC's  Ectopy Frequency Frequent  Vascular  R Radial Pulse +2  L Radial Pulse +2  Edema Generalized;Facial  Integumentary  Integumentary (WDL) X  Skin Color Appropriate for ethnicity  Musculoskeletal  Musculoskeletal (WDL) X  Generalized Weakness Yes  Assistive Device None  GU Assessment  Genitourinary (WDL) X  Genitourinary Symptoms  (HD)  Psychosocial  Psychosocial (WDL) WDL

## 2018-03-26 NOTE — ED Notes (Signed)
Dialysis called. Report given to [facility]. Pt taken to dialysis

## 2018-03-26 NOTE — Progress Notes (Signed)
Post HD assessment. Pt tolerated tx well without c/o or complication. Net UF 2504, goal met.    03/26/18 2039  Vital Signs  Temp 98.3 F (36.8 C)  Temp Source Oral  Pulse Rate 81  Pulse Rate Source Monitor  Resp 18  BP 134/82  BP Location Left Arm  BP Method Automatic  Patient Position (if appropriate) Lying  Oxygen Therapy  SpO2 99 %  O2 Device Room Air  Dialysis Weight  Weight 80.5 kg  Type of Weight Post-Dialysis  Post-Hemodialysis Assessment  Rinseback Volume (mL) 250 mL  KECN 78.9 V  Dialyzer Clearance Lightly streaked  Duration of HD Treatment -hour(s) 3.5 hour(s)  Hemodialysis Intake (mL) 500 mL  UF Total -Machine (mL) 3004 mL  Net UF (mL) 2504 mL  Tolerated HD Treatment Yes  Education / Care Plan  Dialysis Education Provided Yes  Documented Education in Care Plan Yes  Hemodialysis Catheter Right Internal jugular Double-lumen  No Placement Date or Time found.   Placed prior to admission: Yes  Orientation: Right  Access Location: Internal jugular  Hemodialysis Catheter Type: Double-lumen  Site Condition No complications  Blue Lumen Status Heparin locked  Red Lumen Status Heparin locked  Purple Lumen Status N/A  Catheter fill solution Heparin 1000 units/ml  Catheter fill volume (Arterial) 1.5 cc  Catheter fill volume (Venous) 1.5  Dressing Type Biopatch  Dressing Status Clean;Dry;Intact  Interventions New dressing  Drainage Description None  Dressing Change Due 04/02/18  Post treatment catheter status Capped and Clamped

## 2018-03-26 NOTE — ED Notes (Signed)
Pt asked for something to eat. Dr Mariea Clonts notified and approved. Food tray given.

## 2018-03-26 NOTE — ED Notes (Signed)
Date and time results received: 03/26/18 3:57 PM (use smartphrase ".now" to insert current time)  Test: potassium Critical Value: 6.6  Name of Provider Notified: Dr Mariea Clonts

## 2018-03-26 NOTE — ED Triage Notes (Signed)
Pt arrived to ED from home via [facility] with c/c of shortness of breath and generalized weakness. EMS reports that pt has not been to dialysis in a week. Transport vitals: 130/80, p75, O2 sat 95%, pt given duoneb and O2 improved to 99%. Upon arrival pt A&Ox4, pt states he slept through his last 2 dialysis appointments and missed his ride.

## 2018-03-27 DIAGNOSIS — E119 Type 2 diabetes mellitus without complications: Secondary | ICD-10-CM | POA: Diagnosis not present

## 2018-03-27 DIAGNOSIS — N2581 Secondary hyperparathyroidism of renal origin: Secondary | ICD-10-CM | POA: Diagnosis not present

## 2018-03-27 DIAGNOSIS — D631 Anemia in chronic kidney disease: Secondary | ICD-10-CM | POA: Diagnosis not present

## 2018-03-27 DIAGNOSIS — I509 Heart failure, unspecified: Secondary | ICD-10-CM | POA: Diagnosis not present

## 2018-03-27 DIAGNOSIS — E875 Hyperkalemia: Secondary | ICD-10-CM | POA: Diagnosis not present

## 2018-03-27 DIAGNOSIS — N186 End stage renal disease: Secondary | ICD-10-CM | POA: Diagnosis not present

## 2018-03-27 DIAGNOSIS — R0602 Shortness of breath: Secondary | ICD-10-CM | POA: Diagnosis not present

## 2018-03-27 LAB — CBC
HCT: 34.4 % — ABNORMAL LOW (ref 39.0–52.0)
Hemoglobin: 9.9 g/dL — ABNORMAL LOW (ref 13.0–17.0)
MCH: 24.3 pg — AB (ref 26.0–34.0)
MCHC: 28.8 g/dL — ABNORMAL LOW (ref 30.0–36.0)
MCV: 84.3 fL (ref 80.0–100.0)
Platelets: 283 10*3/uL (ref 150–400)
RBC: 4.08 MIL/uL — ABNORMAL LOW (ref 4.22–5.81)
RDW: 18.6 % — ABNORMAL HIGH (ref 11.5–15.5)
WBC: 5.7 10*3/uL (ref 4.0–10.5)
nRBC: 0 % (ref 0.0–0.2)

## 2018-03-27 LAB — BASIC METABOLIC PANEL
Anion gap: 10 (ref 5–15)
BUN: 28 mg/dL — ABNORMAL HIGH (ref 6–20)
CO2: 33 mmol/L — AB (ref 22–32)
Calcium: 8 mg/dL — ABNORMAL LOW (ref 8.9–10.3)
Chloride: 94 mmol/L — ABNORMAL LOW (ref 98–111)
Creatinine, Ser: 8.05 mg/dL — ABNORMAL HIGH (ref 0.61–1.24)
GFR calc Af Amer: 8 mL/min — ABNORMAL LOW (ref >60)
GFR calc non Af Amer: 7 mL/min — ABNORMAL LOW (ref >60)
Glucose, Bld: 134 mg/dL — ABNORMAL HIGH (ref 70–99)
Potassium: 5.1 mmol/L (ref 3.5–5.1)
Sodium: 137 mmol/L (ref 135–145)

## 2018-03-27 LAB — PHOSPHORUS: Phosphorus: 5.3 mg/dL — ABNORMAL HIGH (ref 2.5–4.6)

## 2018-03-27 LAB — TROPONIN I: Troponin I: 0.06 ng/mL (ref <0.03)

## 2018-03-27 MED ORDER — EPOETIN ALFA 10000 UNIT/ML IJ SOLN
4000.0000 [IU] | INTRAMUSCULAR | Status: DC
  Administered 2018-03-27: 4000 [IU] via INTRAVENOUS

## 2018-03-27 MED ORDER — IPRATROPIUM-ALBUTEROL 0.5-2.5 (3) MG/3ML IN SOLN
3.0000 mL | Freq: Three times a day (TID) | RESPIRATORY_TRACT | Status: DC
  Filled 2018-03-27 (×2): qty 3

## 2018-03-27 MED ORDER — DOXYCYCLINE HYCLATE 100 MG PO TABS: 100.0000 mg | ORAL_TABLET | Freq: Two times a day (BID) | ORAL | 0 refills | Status: DC

## 2018-03-27 MED ORDER — ACETAMINOPHEN 325 MG PO TABS: 650.0000 mg | ORAL_TABLET | Freq: Four times a day (QID) | ORAL | Status: DC | PRN

## 2018-03-27 MED ORDER — ALUM & MAG HYDROXIDE-SIMETH 200-200-20 MG/5ML PO SUSP
30.0000 mL | Freq: Three times a day (TID) | ORAL | Status: DC | PRN
  Administered 2018-03-27: 30 mL via ORAL
  Filled 2018-03-27 (×3): qty 30

## 2018-03-27 MED ORDER — ALBUTEROL SULFATE (2.5 MG/3ML) 0.083% IN NEBU
2.5000 mg | INHALATION_SOLUTION | RESPIRATORY_TRACT | Status: DC | PRN
  Filled 2018-03-27: qty 3

## 2018-03-27 NOTE — Progress Notes (Signed)
Pre HD assessment    03/27/18 0908  Neurological  Level of Consciousness Alert  Orientation Level Oriented X4  Respiratory  Respiratory Pattern Regular;Unlabored  Chest Assessment Chest expansion symmetrical  Cardiac  Pulse Irregular  ECG Monitor Yes  Cardiac Rhythm NSR  Ectopy Unifocal PVC's  Ectopy Frequency Frequent  Vascular  R Radial Pulse +2  L Radial Pulse +2  Edema Generalized;Facial  Integumentary  Integumentary (WDL) X  Skin Color Appropriate for ethnicity  Musculoskeletal  Musculoskeletal (WDL) X  Generalized Weakness Yes  Assistive Device None  GU Assessment  Genitourinary (WDL) X  Genitourinary Symptoms  (HD)  Psychosocial  Psychosocial (WDL) WDL

## 2018-03-27 NOTE — Progress Notes (Signed)
Pre HD assessment   03/27/18 0907  Vital Signs  Temp 98.4 F (36.9 C)  Temp Source Oral  Pulse Rate 81  Pulse Rate Source Monitor  Resp (!) 21  BP 126/82  BP Location Left Arm  BP Method Automatic  Patient Position (if appropriate) Lying  Oxygen Therapy  SpO2 91 %  O2 Device Room Air  Pain Assessment  Pain Scale 0-10  Pain Score 0  Dialysis Weight  Weight 79.3 kg  Type of Weight Pre-Dialysis  Time-Out for Hemodialysis  What Procedure? HD  Pt Identifiers(min of two) First/Last Name;MRN/Account#  Correct Site? Yes  Correct Side? Yes  Correct Procedure? Yes  Consents Verified? Yes  Rad Studies Available? N/A  Safety Precautions Reviewed? Yes  Engineer, civil (consulting) Number  (1A)  Station Number 4  UF/Alarm Test Passed  Conductivity: Meter 14  Conductivity: Machine  14.2  pH 7.4  Reverse Osmosis main  Normal Saline Lot Number [id number]  Dialyzer Lot Number [id number]  Disposable Set Lot Number [id number]  Machine Temperature 98.6 F (37 C)  Musician and Audible Yes  Blood Lines Intact and Secured Yes  Pre Treatment Patient Checks  Vascular access used during treatment Catheter  Hepatitis B Surface Antigen Results Negative  Date Hepatitis B Surface Antigen Drawn 06/16/17  Hepatitis B Surface Antibody  (>10)  Date Hepatitis B Surface Antibody Drawn 06/16/17  Hemodialysis Consent Verified Yes  Hemodialysis Standing Orders Initiated Yes  ECG (Telemetry) Monitor On Yes  Prime Ordered Normal Saline  Length of  DialysisTreatment -hour(s) 3.5 Hour(s)  Dialyzer Elisio 17H NR  Dialysate 2K, 2.5 Ca  Dialysis Anticoagulant None  Dialysate Flow Ordered 800  Blood Flow Rate Ordered 400 mL/min  Ultrafiltration Goal 1.5 Liters  Pre Treatment Labs Phosphorus  Dialysis Blood Pressure Support Ordered Normal Saline  Education / Care Plan  Dialysis Education Provided Yes  Documented Education in Care Plan Yes  Hemodialysis Catheter Right Internal jugular Double-lumen   No Placement Date or Time found.   Placed prior to admission: Yes  Orientation: Right  Access Location: Internal jugular  Hemodialysis Catheter Type: Double-lumen  Site Condition No complications  Blue Lumen Status Heparin locked  Red Lumen Status Heparin locked  Purple Lumen Status N/A  Dressing Type Biopatch  Dressing Status Clean;Dry;Intact  Drainage Description None

## 2018-03-27 NOTE — Progress Notes (Signed)
HD tx start    03/27/18 0914  Vital Signs  Pulse Rate 78  Pulse Rate Source Monitor  Resp 16  BP 127/78  BP Location Left Arm  BP Method Automatic  Patient Position (if appropriate) Lying  Oxygen Therapy  SpO2 90 %  O2 Device Room Air  During Hemodialysis Assessment  Blood Flow Rate (mL/min) 400 mL/min  Arterial Pressure (mmHg) -140 mmHg  Venous Pressure (mmHg) 120 mmHg  Transmembrane Pressure (mmHg) 60 mmHg  Ultrafiltration Rate (mL/min) 570 mL/min  Dialysate Flow Rate (mL/min) 800 ml/min  Conductivity: Machine  14  HD Safety Checks Performed Yes  Dialysis Fluid Bolus Normal Saline  Bolus Amount (mL) 250 mL  Intra-Hemodialysis Comments Tx initiated  Hemodialysis Catheter Right Internal jugular Double-lumen  No Placement Date or Time found.   Placed prior to admission: Yes  Orientation: Right  Access Location: Internal jugular  Hemodialysis Catheter Type: Double-lumen  Blue Lumen Status Infusing  Red Lumen Status Infusing

## 2018-03-27 NOTE — Progress Notes (Signed)
HD tx end    03/27/18 1252  Vital Signs  Pulse Rate 75  Pulse Rate Source Monitor  Resp 19  BP 137/78  BP Location Left Arm  BP Method Automatic  Patient Position (if appropriate) Lying  Oxygen Therapy  SpO2 100 %  O2 Device Nasal Cannula  O2 Flow Rate (L/min) 2 L/min  During Hemodialysis Assessment  Dialysis Fluid Bolus Normal Saline  Bolus Amount (mL) 250 mL  Intra-Hemodialysis Comments Tx completed

## 2018-03-27 NOTE — Discharge Summary (Signed)
[place] [facility] [place] NAME: Stanley [name]    MR#:  [id number]  DATE OF BIRTH:  1960-12-22  DATE OF ADMISSION:  03/26/2018 ADMITTING PHYSICIAN: Dustin Flock, MD  DATE OF DISCHARGE: 03/27/18   PRIMARY CARE PHYSICIAN: [name], MD    ADMISSION DIAGNOSIS:  Hyperkalemia [E87.5] Acute pulmonary edema ([place]) [J81.0] Noncompliance of patient with renal dialysis ([place]) [Z91.15] Congestive heart failure, unspecified HF chronicity, unspecified heart failure type ([place]) [I50.9]  DISCHARGE DIAGNOSIS:  Active Problems:   Hyperkalemia [name] Chronic liver cirrhosis GERD paracentesis every Thursday  SECONDARY DIAGNOSIS:   Past Medical History:  Diagnosis Date  . Alcohol abuse   . CHF (congestive heart failure) ([place])   . Cirrhosis ([place])   . Coronary artery disease 2009  . Drug abuse ([place])   . End stage renal disease on dialysis [facility]) NEPHROLOGIST-   DR [facility]  IN [place]   HEMODIALYSIS --   TUES/  THURS/  SAT  . Gastrointestinal bleed 06/13/2017   From chart...hx of multiple GI bleeds  . GERD (gastroesophageal reflux disease)   . Hyperlipidemia   . Hypertension   . PAD (peripheral artery disease) ([place])   . Renal insufficiency    Per pt, 32 oz fluid restriction per day  . S/P triple vessel bypass 06/09/2016   2009ish  . Suicidal ideation    & HOMICIDAL IDEATION --  06-16-2013   ADMITTED TO [place] COURSE:   HISTORY OF PRESENT ILLNESS: Stanley [name]  is a 58 y.o. male with a known history of alcohol abuse, congestive heart failure, liver cirrhosis, coronary artery disease, drug abuse, end-stage renal disease on hemodialysis who has been noncompliant with his dialysis.  Patient presents with shortness of breath.  In the ER is noted to have hyperkalemia and chest x-ray suggestive of congestive heart failure due to fluid overload.  Patient's last hemodialysis was 6 days  ago.  He is having cough and congestion as well complains of some wheezing   1.  Shortness of breath due to patient missing his hemodialysis Patient was dialyzed yesterday and today Patient also has acute bronchitis we will treat with oral antibiotics Clinically doing fine and wants to be discharged Okay to discharge patient from nephrology standpoint Reinforced the importance of being compliant with his dialysis schedule.  He verbalized understanding  2.  Hyperkalemia has received treatment and had hemodialysis yesterday and today her potassium is down to 5.1 today  3.  History of COPD will use nebs as needed  4.  Neuropathy continue gabapentin  5.  GERD continue PPI  6.  Chronic liver cirrhosis and gets paracentesis every Thursday.  Last paracentesis was done last Thursday and patient  denies any abdominal distention and wants to go home  Discharge patient home DISCHARGE CONDITIONS:   FAIR  CONSULTS OBTAINED:     PROCEDURES hd  DRUG ALLERGIES:  No Known Allergies  DISCHARGE MEDICATIONS:   Allergies as of 03/27/2018   No Known Allergies     Medication List    TAKE these medications   acetaminophen 325 MG tablet Commonly known as:  TYLENOL Take 2 tablets (650 mg total) by mouth every 6 (six) hours as needed for mild pain (or Fever >/= 101).   budesonide-formoterol 160-4.5 MCG/ACT inhaler Commonly known as:  SYMBICORT Inhale 2 puffs into the lungs daily.   calcium acetate 667 MG capsule Commonly known as:  PHOSLO Take 2,001  mg by mouth 3 (three) times daily.   doxycycline 100 MG tablet Commonly known as:  VIBRA-TABS Take 1 tablet (100 mg total) by mouth every 12 (twelve) hours.   folic acid 1 MG tablet Commonly known as:  FOLVITE Take 1 tablet (1 mg total) by mouth daily.   gabapentin 300 MG capsule Commonly known as:  NEURONTIN Take 300 mg by mouth 3 (three) times daily.   ipratropium-albuterol 0.5-2.5 (3) MG/3ML Soln Commonly known as:   DUONEB Take 3 mLs by nebulization every 6 (six) hours as needed.   multivitamin Tabs tablet Take 1 tablet by mouth at bedtime.   mupirocin ointment 2 % Commonly known as:  BACTROBAN Place 1 application into the nose 2 (two) times daily.   omeprazole 40 MG capsule Commonly known as:  PRILOSEC Take 40 mg by mouth daily.   Spacer/Aero Chamber Mouthpiece Misc 1 Units by Does not apply route every 4 (four) hours as needed (wheezing).   tiotropium 18 MCG inhalation capsule Commonly known as:  SPIRIVA HANDIHALER Place 1 capsule (18 mcg total) into inhaler and inhale daily.        DISCHARGE INSTRUCTIONS:   Follow-up with primary care physician in 3 days For continue dialysis Follow-up with nephrology in 5 to 7 days Continue paracentesis every Thursday   DIET:  Renal diet  DISCHARGE CONDITION:  Fair  ACTIVITY:  Activity as tolerated  OXYGEN:  Home Oxygen: No.   Oxygen Delivery: room air  DISCHARGE LOCATION:  home   If you experience worsening of your admission symptoms, develop shortness of breath, life threatening emergency, suicidal or homicidal thoughts you must seek medical attention immediately by calling 911 or calling your MD immediately  if symptoms less severe.  You Must read complete instructions/literature along with all the possible adverse reactions/side effects for all the Medicines you take and that have been prescribed to you. Take any new Medicines after you have completely understood and accpet all the possible adverse reactions/side effects.   Please note  You were cared for by a hospitalist during your hospital stay. If you have any questions about your discharge medications or the care you received while you were in the hospital after you are discharged, you can call the unit and asked to speak with the hospitalist on call if the hospitalist that took care of you is not available. Once you are discharged, your primary care physician will handle any  further medical issues. Please note that NO REFILLS for any discharge medications will be authorized once you are discharged, as it is imperative that you return to your primary care physician (or establish a relationship with a primary care physician if you do not have one) for your aftercare needs so that they can reassess your need for medications and monitor your lab values.     Today  Chief Complaint  Patient presents with  . Shortness of Breath  . Weakness   Patient is doing fine.  Denies any chest pain or shortness of breath.  Denies any abdominal distention.  Last paracentesis was done last Thursday.  He wants to be discharged.  Had hemodialysis yesterday and today  ROS:  CONSTITUTIONAL: Denies fevers, chills. Denies any fatigue, weakness.  EYES: Denies blurry vision, double vision, eye pain. EARS, NOSE, THROAT: Denies tinnitus, ear pain, hearing loss. RESPIRATORY: Denies cough, wheeze, shortness of breath.  CARDIOVASCULAR: Denies chest pain, palpitations, edema.  GASTROINTESTINAL: Denies nausea, vomiting, diarrhea, abdominal pain. Denies bright red blood per rectum. GENITOURINARY: Denies  dysuria, hematuria. ENDOCRINE: Denies nocturia or thyroid problems. HEMATOLOGIC AND LYMPHATIC: Denies easy bruising or bleeding. SKIN: Denies rash or lesion. MUSCULOSKELETAL: Denies pain in neck, back, shoulder, knees, hips or arthritic symptoms.  NEUROLOGIC: Denies paralysis, paresthesias.  PSYCHIATRIC: Denies anxiety or depressive symptoms.   VITAL SIGNS:  Blood pressure 128/65, pulse 86, temperature 98.4 F (36.9 C), temperature source Oral, resp. rate 20, height 6\' 3"  (1.905 m), weight 76.6 kg, SpO2 96 %.  I/O:    Intake/Output Summary (Last 24 hours) at 03/27/2018 1444 Last data filed at 03/27/2018 1259 Gross per 24 hour  Intake 0 ml  Output 4017 ml  Net -4017 ml    PHYSICAL EXAMINATION:  GENERAL:  58 y.o.-year-old patient lying in the bed with no acute distress.  EYES:  Pupils equal, round, reactive to light and accommodation. No scleral icterus. Extraocular muscles intact.  HEENT: Head atraumatic, normocephalic. Oropharynx and nasopharynx clear.  NECK:  Supple, no jugular venous distention. No thyroid enlargement, no tenderness.  LUNGS: Normal breath sounds bilaterally, no wheezing, rales,rhonchi or crepitation. No use of accessory muscles of respiration.  CARDIOVASCULAR: S1, S2 normal. No murmurs, rubs, or gallops.  ABDOMEN: Soft, non-tender, non-distended. Bowel sounds present. No organomegaly or mass.  EXTREMITIES: No pedal edema, cyanosis, or clubbing.  NEUROLOGIC: Awake, alert and oriented x3. Sensation intact. Gait not checked.  PSYCHIATRIC: The patient is alert and oriented x 3.  SKIN: No obvious rash, lesion, or ulcer.   DATA REVIEW:   CBC Recent Labs  Lab 03/27/18 0507  WBC 5.7  HGB 9.9*  HCT 34.4*  PLT 283    Chemistries  Recent Labs  Lab 03/26/18 1451 03/27/18 0507  NA 136 137  K 6.6* 5.1  CL 90* 94*  CO2 33* 33*  GLUCOSE 113* 134*  BUN 53* 28*  CREATININE 13.29* 8.05*  CALCIUM 8.1* 8.0*  AST 19  --   ALT 13  --   ALKPHOS 114  --   BILITOT 0.7  --     Cardiac Enzymes Recent Labs  Lab 03/27/18 0507  TROPONINI 0.06*    Microbiology Results  Results for orders placed or performed during the hospital encounter of 01/29/18  Culture, blood (Routine x 2)     Status: None   Collection Time: 01/29/18  3:25 PM  Result Value Ref Range Status   Specimen Description BLOOD BLOOD LEFT FOREARM  Final   Special Requests   Final    BOTTLES DRAWN AEROBIC AND ANAEROBIC Blood Culture results may not be optimal due to an inadequate volume of blood received in culture bottles   Culture   Final    NO GROWTH 5 DAYS Performed at [facility], [address] [place], [place] [address]    Report Status 02/03/2018 FINAL  Final  Culture, body fluid-bottle     Status: None   Collection Time: 01/29/18  5:05 PM  Result Value Ref  Range Status   Specimen Description PERITONEAL  Final   Special Requests NONE  Final   Culture   Final    NO GROWTH 5 DAYS Performed at [facility], [place] [address], [place], [place] [address]    Report Status 02/03/2018 FINAL  Final  Gram stain     Status: None   Collection Time: 01/29/18  5:05 PM  Result Value Ref Range Status   Specimen Description PERITONEAL  Final   Special Requests NONE  Final   Gram Stain   Final    WBC PRESENT,  PREDOMINANTLY MONONUCLEAR NO ORGANISMS SEEN CYTOSPIN SMEAR Performed at [facility], [place] [address], [place], [place] [address]    Report Status 01/30/2018 FINAL  Final  Culture, blood (Routine x 2)     Status: None   Collection Time: 01/30/18  2:01 AM  Result Value Ref Range Status   Specimen Description BLOOD LEFT HAND  Final   Special Requests   Final    BOTTLES DRAWN AEROBIC AND ANAEROBIC Blood Culture adequate volume   Culture   Final    NO GROWTH 5 DAYS Performed at [facility], [address] [place], [place] [address]    Report Status 02/04/2018 FINAL  Final    RADIOLOGY:  Dg Chest 2 View  Result Date: 03/26/2018 CLINICAL DATA:  Shortness of breath and weakness. EXAM: CHEST - 2 VIEW COMPARISON:  Chest x-ray dated March 13, 2018. FINDINGS: Unchanged tunneled right internal jugular dialysis catheter. Stable cardiomegaly status post CABG. Mildly progressed diffuse interstitial thickening. No focal consolidation, pleural effusion, or pneumothorax. No acute osseous abnormality. IMPRESSION: 1. Mildly progressed interstitial pulmonary edema. Electronically Signed   By: Titus Dubin M.D.   On: 03/26/2018 15:25    EKG:   Orders placed or performed during the hospital encounter of 03/26/18  . ED EKG within 10 minutes  . ED EKG within 10 minutes  . EKG 12-Lead  . EKG 12-Lead      Management plans discussed with the patient, family and they are in agreement.  CODE STATUS:     Code Status Orders  (From  admission, onward)         Start     Ordered   03/26/18 2223  Full code  Continuous     03/26/18 2222        Code Status History    Date Active Date Inactive Code Status Order ID Comments User Context   01/30/2018 0122 01/30/2018 2116 Full Code [id number]  Vaughan Basta, MD Inpatient   01/15/2018 1437 01/19/2018 1251 Full Code [id number]  Vaughan Basta, MD Inpatient   12/23/2017 2225 12/26/2017 1846 Full Code [id number]  Amelia Jo, MD Inpatient   12/09/2017 1043 12/11/2017 1617 Full Code [id number]  Epifanio Lesches, MD ED   11/27/2017 1122 11/28/2017 1827 Full Code [id number]  Demetrios Loll, MD Inpatient   11/18/2017 2008 11/20/2017 1853 Full Code [id number]  Saundra Shelling, MD Inpatient   10/30/2017 0007 10/31/2017 1724 Full Code [id number]  Amelia Jo, MD Inpatient   06/12/2017 1707 06/16/2017 1704 Full Code [id number]  Nicholes Mango, MD Inpatient   06/02/2017 1603 06/06/2017 2222 Full Code [id number]  Demetrios Loll, MD Inpatient   05/24/2017 1831 05/25/2017 1921 Full Code [id number]  Saundra Shelling, MD ED   05/03/2017 1331 05/05/2017 1806 Full Code [id number]  Bettey Costa, MD Inpatient   04/01/2017 0947 04/03/2017 1156 Full Code [id number]  Loletha Grayer, MD ED   03/09/2017 0010 03/09/2017 1928 Full Code [id number]  Lance Coon, MD Inpatient   03/03/2017 2027 03/04/2017 1920 Full Code [id number]  Demetrios Loll, MD Inpatient   02/28/2017 2254 03/02/2017 1519 Full Code [id number]  Lance Coon, MD Inpatient   02/22/2017 1721 02/24/2017 1359 Full Code [id number]  Dustin Flock, MD Inpatient   01/22/2017 2337 01/25/2017 1522 Full Code [id number]  Gorden Harms, MD Inpatient   01/07/2017 1445 01/13/2017 1812 Full Code [id number]  Idelle Crouch, MD Inpatient   12/22/2016 1431 12/24/2016 1804 Full Code [id number]  Hillary Bow, MD ED   12/20/2016 2020 12/21/2016 1853 Full Code  [id number]  Demetrios Loll, MD Inpatient   11/26/2016 1030 11/28/2016 1522 Full Code [id number]  Henreitta Leber, MD Inpatient    11/15/2016 1930 11/16/2016 1822 Full Code [id number]  Fritzi Mandes, MD Inpatient   10/29/2016 1453 10/30/2016 1506 Full Code [id number]  Loletha Grayer, MD ED   09/15/2016 1757 09/16/2016 1817 Full Code [id number]  Dustin Flock, MD ED   08/16/2016 0945 08/18/2016 1816 Full Code [id number]  Hillary Bow, MD ED   08/09/2016 1659 08/12/2016 2034 Full Code [id number]  Fritzi Mandes, MD Inpatient   07/03/2016 2154 07/05/2016 2122 Full Code [id number]  Demetrios Loll, MD Inpatient   06/24/2016 0423 06/26/2016 1546 Full Code [id number]  Saundra Shelling, MD Inpatient   06/14/2016 1453 06/19/2016 2104 Full Code [id number]  Hillary Bow, MD ED   05/31/2016 0642 06/01/2016 2130 Full Code [id number]  Saundra Shelling, MD Inpatient   05/05/2016 2309 05/10/2016 2157 Full Code [id number]  Vianne Bulls, MD Inpatient   04/16/2016 0356 04/17/2016 1827 Full Code [id number]  Saundra Shelling, MD Inpatient   01/19/2016 1513 01/22/2016 1414 Full Code [id number]  Loletha Grayer, MD ED   09/24/2015 1033 09/25/2015 1331 Full Code [id number]  Loletha Grayer, MD ED   05/16/2015 1417 05/17/2015 2057 Full Code [id number]  Idelle Crouch, MD Inpatient   04/30/2015 2008 05/08/2015 1940 Full Code [id number]  Vaughan Basta, MD Inpatient   04/06/2015 0507 04/07/2015 1736 Full Code [id number]  Lance Coon, MD Inpatient   03/23/2015 1107 03/27/2015 1246 Full Code [id number]  Bettey Costa, MD Inpatient   06/19/2013 1132 06/21/2013 1749 Full Code [id number]  Cristal Ford, DO Inpatient   06/16/2013 1726 06/19/2013 1132 Full Code [id number]  Margarita Mail, PA-C ED   06/05/2013 1533 06/06/2013 1856 Full Code [id number]  Theodoro Kos, DO Inpatient   06/02/2013 0901 06/05/2013 1533 Full Code [id number]  Velvet Bathe, MD Inpatient   06/02/2013 0840 06/02/2013 0901 Full Code [id number]  Velvet Bathe, MD Inpatient      TOTAL TIME TAKING CARE OF THIS PATIENT: 16minutes.   Note: This dictation was prepared with Dragon dictation along with smaller phrase technology. Any transcriptional  errors that result from this process are unintentional.   @M[facility] @  on 03/27/2018 at 2:44 PM  Between 7am to 6pm - Pager - [phone]  After 6pm go to [email or web address] - password [email or web address] [place] [facility]  Office  [phone]  CC: Primary care physician; [name], MD

## 2018-03-27 NOTE — Progress Notes (Signed)
Post HD assessment   03/27/18 1257  Neurological  Level of Consciousness Alert  Orientation Level Oriented X4  Respiratory  Respiratory Pattern Regular;Unlabored  Chest Assessment Chest expansion symmetrical  Cardiac  Pulse Irregular  ECG Monitor Yes  Cardiac Rhythm NSR  Ectopy Unifocal PVC's  Ectopy Frequency Frequent  Vascular  R Radial Pulse +2  L Radial Pulse +2  Edema Generalized;Facial  Integumentary  Integumentary (WDL) X  Skin Color Appropriate for ethnicity  Musculoskeletal  Musculoskeletal (WDL) X  Generalized Weakness Yes  Assistive Device None  GU Assessment  Genitourinary (WDL) X  Genitourinary Symptoms  (HD)  Psychosocial  Psychosocial (WDL) WDL

## 2018-03-27 NOTE — Progress Notes (Signed)
Post HD assessment. Pt tolerated tx well without c/o or complication. Net UF 1513, goal met.    03/27/18 1259  Vital Signs  Temp 98 F (36.7 C)  Temp Source Oral  Pulse Rate 76  Pulse Rate Source Monitor  Resp 15  BP 118/81  BP Location Left Arm  BP Method Automatic  Patient Position (if appropriate) Lying  Oxygen Therapy  SpO2 96 %  O2 Device Room Air  Dialysis Weight  Weight 76.6 kg  Type of Weight Post-Dialysis  Post-Hemodialysis Assessment  Rinseback Volume (mL) 250 mL  KECN 78.4 V  Dialyzer Clearance Lightly streaked  Duration of HD Treatment -hour(s) 3.5 hour(s)  Hemodialysis Intake (mL) 500 mL  UF Total -Machine (mL) 2013 mL  Net UF (mL) 1513 mL  Tolerated HD Treatment Yes  Education / Care Plan  Dialysis Education Provided Yes  Documented Education in Care Plan Yes  Hemodialysis Catheter Right Internal jugular Double-lumen  No Placement Date or Time found.   Placed prior to admission: Yes  Orientation: Right  Access Location: Internal jugular  Hemodialysis Catheter Type: Double-lumen  Site Condition No complications  Blue Lumen Status Heparin locked  Red Lumen Status Heparin locked  Purple Lumen Status N/A  Catheter fill solution Heparin 1000 units/ml  Catheter fill volume (Arterial) 1.5 cc  Catheter fill volume (Venous) 1.5  Dressing Type Biopatch  Dressing Status Clean;Dry;Intact  Drainage Description None  Post treatment catheter status Capped and Clamped

## 2018-03-27 NOTE — Progress Notes (Signed)
MD ordered patient to be discharged home.  Discharge instructions were reviewed with the patient and he voiced understanding.  Patient was in a rush and said he would make his own follow-up appointment. Prescription called into patients pharmacy.  IV was removed by student with catheter intact.  All patients questions were answered.  Patient left via wheelchair escorted by  auxillary.

## 2018-03-27 NOTE — Progress Notes (Signed)
RN unavailable to give report prior to HD tx , RN to call this RN back to give report.    03/27/18 0835  Hand-Off documentation  Report given to (Full Name) Stark Bray  Report received from (Full Name) [facility]

## 2018-03-27 NOTE — Discharge Instructions (Signed)
Follow-up with primary care physician in 3 days For continue dialysis Follow-up with nephrology in 5 to 7 days Continue paracentesis every Thursday

## 2018-03-27 NOTE — Progress Notes (Signed)
[facility]    Subjective:  Patient seen and evaluated during hemodialysis. Thus far tolerating well. Hyperkalemia improved after urgent dialysis yesterday.   Objective:  Vital signs in last 24 hours:  Temp:  [97.8 F (36.6 C)-98.9 F (37.2 C)] 98.4 F (36.9 C) (01/14 0907) Pulse Rate:  [76-96] 87 (01/14 1015) Resp:  [15-22] 16 (01/14 1015) BP: (108-149)/(61-101) 122/62 (01/14 1015) SpO2:  [90 %-99 %] 98 % (01/14 1015) Weight:  [79.3 kg-82.8 kg] 79.3 kg (01/14 0907)  Weight change:  Filed Weights   03/26/18 1647 03/26/18 2039 03/27/18 0907  Weight: 82.7 kg 80.5 kg 79.3 kg    Intake/Output: I/O last 3 completed shifts: In: 0  Out: 2504 [[id number]]   Intake/Output this shift:  No intake/output data recorded.  Physical Exam: General: NAD  Head: Normocephalic, atraumatic. Moist oral mucosal membranes  Eyes: Anicteric  Neck: Supple, trachea midline  Lungs:  Clear to auscultation, normal effort  Heart: Regular rate and rhythm  Abdomen:  Soft, nontender, +ascites  Extremities: no peripheral edema.  Neurologic: Nonfocal, moving all four extremities  Skin: No lesions  Access: RIJ CVC    Basic Metabolic Panel: Recent Labs  Lab 03/26/18 1451 03/26/18 1720 03/27/18 0507 03/27/18 0952  NA 136  --  137  --   K 6.6*  --  5.1  --   CL 90*  --  94*  --   CO2 33*  --  33*  --   GLUCOSE 113*  --  134*  --   BUN 53*  --  28*  --   CREATININE 13.29*  --  8.05*  --   CALCIUM 8.1*  --  8.0*  --   PHOS  --  7.0*  --  5.3*    Liver Function Tests: Recent Labs  Lab 03/26/18 1451  AST 19  ALT 13  ALKPHOS 114  BILITOT 0.7  PROT 6.5  ALBUMIN 2.7*   Recent Labs  Lab 03/26/18 1451  LIPASE 21   No results for input(s): AMMONIA in the last 168 hours.  CBC: Recent Labs  Lab 03/26/18 1451 03/27/18 0507  WBC 5.8 5.7  HGB 10.9* 9.9*  HCT 38.0* 34.4*  MCV 84.6 84.3  PLT 307 283    Cardiac Enzymes: Recent Labs  Lab 03/26/18 1451  03/26/18 2234 03/27/18 0507  TROPONINI 0.07* 0.06* 0.06*    BNP: Invalid input(s): POCBNP  CBG: No results for input(s): GLUCAP in the last 168 hours.  Microbiology: Results for orders placed or performed during the hospital encounter of 01/29/18  Culture, blood (Routine x 2)     Status: None   Collection Time: 01/29/18  3:25 PM  Result Value Ref Range Status   Specimen Description BLOOD BLOOD LEFT FOREARM  Final   Special Requests   Final    BOTTLES DRAWN AEROBIC AND ANAEROBIC Blood Culture results may not be optimal due to an inadequate volume of blood received in culture bottles   Culture   Final    NO GROWTH 5 DAYS Performed at [facility], [address] [place], [place] [address]    Report Status 02/03/2018 FINAL  Final  Culture, body fluid-bottle     Status: None   Collection Time: 01/29/18  5:05 PM  Result Value Ref Range Status   Specimen Description PERITONEAL  Final   Special Requests NONE  Final   Culture   Final    NO GROWTH 5 DAYS Performed at [facility], [address] [place], [place] [address]    Report Status 02/03/2018 FINAL  Final  Gram stain     Status: None   Collection Time: 01/29/18  5:05 PM  Result Value Ref Range Status   Specimen Description PERITONEAL  Final   Special Requests NONE  Final   Gram Stain   Final    WBC PRESENT, PREDOMINANTLY MONONUCLEAR NO ORGANISMS SEEN CYTOSPIN SMEAR Performed at [facility], [place] [address] [place], [place] [address]    Report Status 01/30/2018 FINAL  Final  Culture, blood (Routine x 2)     Status: None   Collection Time: 01/30/18  2:01 AM  Result Value Ref Range Status   Specimen Description BLOOD LEFT HAND  Final   Special Requests   Final    BOTTLES DRAWN AEROBIC AND ANAEROBIC Blood Culture adequate volume   Culture   Final    NO GROWTH 5 DAYS Performed at [facility], [address] [place],  [address]    Report Status 02/04/2018 FINAL  Final     Coagulation Studies: No results for input(s): LABPROT, INR in the last 72 hours.  Urinalysis: No results for input(s): COLORURINE, LABSPEC, PHURINE, GLUCOSEU, HGBUR, BILIRUBINUR, KETONESUR, PROTEINUR, UROBILINOGEN, NITRITE, LEUKOCYTESUR in the last 72 hours.  Invalid input(s): APPERANCEUR    Imaging: Dg Chest 2 View  Result Date: 03/26/2018 CLINICAL DATA:  Shortness of breath and weakness. EXAM: CHEST - 2 VIEW COMPARISON:  Chest x-ray dated March 13, 2018. FINDINGS: Unchanged tunneled right internal jugular dialysis catheter. Stable cardiomegaly status post CABG. Mildly progressed diffuse interstitial thickening. No focal consolidation, pleural effusion, or pneumothorax. No acute osseous abnormality. IMPRESSION: 1. Mildly progressed interstitial pulmonary edema. Electronically Signed   By: Titus Dubin M.D.   On: 03/26/2018 15:25     Medications:   . sodium chloride     . alum & mag hydroxide-simeth  30 mL Oral Once  . calcium acetate  2,001 mg Oral TID WC  . dextrose  1 ampule Intravenous Once  . doxycycline  100 mg Oral Q12H  . gabapentin  300 mg Oral TID  . heparin  5,000 Units Subcutaneous Q8H  . insulin aspart  10 Units Intravenous Once  . ipratropium-albuterol  3 mL Nebulization Q4H  . multivitamin  1 tablet Oral QHS  . pantoprazole  40 mg Oral Daily  . sodium chloride flush  3 mL Intravenous Q12H     Assessment/ Plan:  Stanley [name] is a 58 y.o. black male withend stage renal disease on hemodialysis secondary to Alport's syndrome, ascites, hypertension, anemia of chronic kidney disease, coronary artery disease, peripheral vascular disease, hyperlipidemia, gastrointestinal AVMs, pulmonary hypertension  [facility] TTS 80.5 kg RIJ permcath.   1.  ESRD on HD TTS.  Patient underwent urgent hemodialysis yesterday.  Tolerated well.  We are performing dialysis today as per his usual schedule.  2.  Hyperkalemia.  Significantly improved.  We are  performing dialysis with 2K bath today as well.  3.  Secondary hyperparathyroidism.  Phosphorus under good control.  Continue calcium acetate 3 tablets p.o. 3 times daily with meals.  4.  Anemia of chronic kidney disease.  Hemoglobin 9.9.  Start the patient on Epogen 4000 units IV with dialysis.   LOS: 1 [name] 1/14/202010:43 AM

## 2018-03-28 ENCOUNTER — Ambulatory Visit: Admission: RE | Admit: 2018-03-28 | Payer: Medicare Other | Source: Ambulatory Visit

## 2018-03-28 ENCOUNTER — Ambulatory Visit: Payer: Medicare Other

## 2018-03-28 LAB — PARATHYROID HORMONE, INTACT (NO CA): PTH: 597 pg/mL — ABNORMAL HIGH (ref 15–65)

## 2018-03-29 DIAGNOSIS — Z992 Dependence on renal dialysis: Secondary | ICD-10-CM | POA: Diagnosis not present

## 2018-03-29 DIAGNOSIS — D509 Iron deficiency anemia, unspecified: Secondary | ICD-10-CM | POA: Diagnosis not present

## 2018-03-29 DIAGNOSIS — D631 Anemia in chronic kidney disease: Secondary | ICD-10-CM | POA: Diagnosis not present

## 2018-03-29 DIAGNOSIS — N2581 Secondary hyperparathyroidism of renal origin: Secondary | ICD-10-CM | POA: Diagnosis not present

## 2018-03-29 DIAGNOSIS — N186 End stage renal disease: Secondary | ICD-10-CM | POA: Diagnosis not present

## 2018-03-30 ENCOUNTER — Telehealth: Payer: Self-pay

## 2018-03-30 NOTE — Telephone Encounter (Signed)
Flagged on EMMI report for having unfilled prescriptions and not being able to fill today/tomorrow.  First attempt to reach patient made, however unable to reach patient.  Left voicemail encouraging callback. Will attempt at later time.

## 2018-03-31 DIAGNOSIS — D631 Anemia in chronic kidney disease: Secondary | ICD-10-CM | POA: Diagnosis not present

## 2018-03-31 DIAGNOSIS — N186 End stage renal disease: Secondary | ICD-10-CM | POA: Diagnosis not present

## 2018-03-31 DIAGNOSIS — Z992 Dependence on renal dialysis: Secondary | ICD-10-CM | POA: Diagnosis not present

## 2018-03-31 DIAGNOSIS — D509 Iron deficiency anemia, unspecified: Secondary | ICD-10-CM | POA: Diagnosis not present

## 2018-03-31 DIAGNOSIS — N2581 Secondary hyperparathyroidism of renal origin: Secondary | ICD-10-CM | POA: Diagnosis not present

## 2018-04-02 NOTE — Telephone Encounter (Signed)
Reached upon second attempt. Patient able to fill his prescriptions, however stated he did not receive refills for his inhaler and nebulizer after he had mentioned he needed them several time.  I apologized they were not written for.  Per patient, he does not have an appointment scheduled with his PCP and hasn't seen them "in a long time".  Encouraged him to call his PCP to let them know he needed refills and go about scheduling a follow up. Number for [facility] given to patient to call for refills/appointment.   No other needs at this time.

## 2018-04-03 ENCOUNTER — Emergency Department: Payer: Medicare Other

## 2018-04-03 ENCOUNTER — Emergency Department
Admission: EM | Admit: 2018-04-03 | Discharge: 2018-04-03 | Disposition: A | Discharge status: Home / Self Care | Payer: Medicare Other | Attending: Emergency Medicine | Admitting: Emergency Medicine

## 2018-04-03 ENCOUNTER — Other Ambulatory Visit: Payer: Self-pay

## 2018-04-03 DIAGNOSIS — N186 End stage renal disease: Secondary | ICD-10-CM | POA: Insufficient documentation

## 2018-04-03 DIAGNOSIS — Z992 Dependence on renal dialysis: Secondary | ICD-10-CM | POA: Insufficient documentation

## 2018-04-03 DIAGNOSIS — I132 Hypertensive heart and chronic kidney disease with heart failure and with stage 5 chronic kidney disease, or end stage renal disease: Secondary | ICD-10-CM | POA: Diagnosis not present

## 2018-04-03 DIAGNOSIS — N2581 Secondary hyperparathyroidism of renal origin: Secondary | ICD-10-CM | POA: Diagnosis not present

## 2018-04-03 DIAGNOSIS — R609 Edema, unspecified: Secondary | ICD-10-CM | POA: Diagnosis not present

## 2018-04-03 DIAGNOSIS — J810 Acute pulmonary edema: Secondary | ICD-10-CM | POA: Insufficient documentation

## 2018-04-03 DIAGNOSIS — R140 Abdominal distension (gaseous): Secondary | ICD-10-CM | POA: Diagnosis not present

## 2018-04-03 DIAGNOSIS — F17210 Nicotine dependence, cigarettes, uncomplicated: Secondary | ICD-10-CM | POA: Diagnosis not present

## 2018-04-03 DIAGNOSIS — F1410 Cocaine abuse, uncomplicated: Secondary | ICD-10-CM | POA: Diagnosis not present

## 2018-04-03 DIAGNOSIS — F1210 Cannabis abuse, uncomplicated: Secondary | ICD-10-CM | POA: Insufficient documentation

## 2018-04-03 DIAGNOSIS — R0902 Hypoxemia: Secondary | ICD-10-CM | POA: Diagnosis not present

## 2018-04-03 DIAGNOSIS — K7031 Alcoholic cirrhosis of liver with ascites: Secondary | ICD-10-CM | POA: Insufficient documentation

## 2018-04-03 DIAGNOSIS — D631 Anemia in chronic kidney disease: Secondary | ICD-10-CM | POA: Diagnosis not present

## 2018-04-03 DIAGNOSIS — I509 Heart failure, unspecified: Secondary | ICD-10-CM | POA: Insufficient documentation

## 2018-04-03 DIAGNOSIS — E875 Hyperkalemia: Secondary | ICD-10-CM | POA: Insufficient documentation

## 2018-04-03 DIAGNOSIS — R52 Pain, unspecified: Secondary | ICD-10-CM | POA: Diagnosis not present

## 2018-04-03 DIAGNOSIS — D509 Iron deficiency anemia, unspecified: Secondary | ICD-10-CM | POA: Diagnosis not present

## 2018-04-03 DIAGNOSIS — R0602 Shortness of breath: Secondary | ICD-10-CM | POA: Diagnosis not present

## 2018-04-03 DIAGNOSIS — Z951 Presence of aortocoronary bypass graft: Secondary | ICD-10-CM | POA: Insufficient documentation

## 2018-04-03 LAB — COMPREHENSIVE METABOLIC PANEL
ALT: 23 U/L (ref 0–44)
AST: 33 U/L (ref 15–41)
Albumin: 2.9 g/dL — ABNORMAL LOW (ref 3.5–5.0)
Alkaline Phosphatase: 148 U/L — ABNORMAL HIGH (ref 38–126)
Anion gap: 9 (ref 5–15)
BUN: 35 mg/dL — ABNORMAL HIGH (ref 6–20)
CALCIUM: 9.1 mg/dL (ref 8.9–10.3)
CO2: 34 mmol/L — AB (ref 22–32)
Chloride: 96 mmol/L — ABNORMAL LOW (ref 98–111)
Creatinine, Ser: 7.49 mg/dL — ABNORMAL HIGH (ref 0.61–1.24)
GFR calc Af Amer: 8 mL/min — ABNORMAL LOW (ref >60)
GFR calc non Af Amer: 7 mL/min — ABNORMAL LOW (ref >60)
Glucose, Bld: 98 mg/dL (ref 70–99)
Potassium: 5.9 mmol/L — ABNORMAL HIGH (ref 3.5–5.1)
Sodium: 139 mmol/L (ref 135–145)
Total Bilirubin: 1 mg/dL (ref 0.3–1.2)
Total Protein: 7.1 g/dL (ref 6.5–8.1)

## 2018-04-03 LAB — CBC
HCT: 35.7 % — ABNORMAL LOW (ref 39.0–52.0)
Hemoglobin: 10 g/dL — ABNORMAL LOW (ref 13.0–17.0)
MCH: 24.6 pg — ABNORMAL LOW (ref 26.0–34.0)
MCHC: 28 g/dL — ABNORMAL LOW (ref 30.0–36.0)
MCV: 87.9 fL (ref 80.0–100.0)
Platelets: 233 10*3/uL (ref 150–400)
RBC: 4.06 MIL/uL — ABNORMAL LOW (ref 4.22–5.81)
RDW: 20.6 % — ABNORMAL HIGH (ref 11.5–15.5)
WBC: 6.5 10*3/uL (ref 4.0–10.5)
nRBC: 0 % (ref 0.0–0.2)

## 2018-04-03 LAB — TROPONIN I: Troponin I: 0.05 ng/mL (ref <0.03)

## 2018-04-03 MED ORDER — NITROGLYCERIN 2 % TD OINT
1.0000 [in_us] | TOPICAL_OINTMENT | Freq: Once | TRANSDERMAL | Status: AC
  Administered 2018-04-03: 1 [in_us] via TOPICAL
  Filled 2018-04-03: qty 1

## 2018-04-03 MED ORDER — CALCIUM GLUCONATE-NACL 1-0.675 GM/50ML-% IV SOLN
1.0000 g | Freq: Once | INTRAVENOUS | Status: AC
  Administered 2018-04-03: 1000 mg via INTRAVENOUS
  Filled 2018-04-03: qty 50

## 2018-04-03 MED ORDER — SODIUM CHLORIDE 0.9 % IV SOLN: 1.0000 g | Freq: Once | INTRAVENOUS | Status: DC

## 2018-04-03 MED ORDER — ONDANSETRON HCL 4 MG/2ML IJ SOLN
4.0000 mg | Freq: Once | INTRAMUSCULAR | Status: AC
  Administered 2018-04-03: 4 mg via INTRAVENOUS
  Filled 2018-04-03: qty 2

## 2018-04-03 NOTE — Discharge Instructions (Addendum)
Please go immediately to your dialysis center; they are able to give you hemodialysis today.  Your symptoms will get worse and you could possibly have life-threatening elevation in your potassium if you do not go today.  Please keep your appointment for your paracentesis tomorrow.  Return to the emergency department if you develop severe pain, lightheadedness or fainting, worsening shortness of breath, palpitations or chest pain, or any other symptoms concerning to you.

## 2018-04-03 NOTE — ED Triage Notes (Signed)
Pt arrived via EMS from home d/t SOB and tightness in lower abdomen. Pt was 90% RA upon EMS arrival. EMS administered duoneb and is now at 98% RA. Pt is due for dialysis today and is A&O x4 at this time.

## 2018-04-03 NOTE — ED Provider Notes (Signed)
[facility] Emergency Department Provider Note  ____________________________________________  Time seen: Approximately 8:34 AM  I have reviewed the triage vital signs and the nursing notes.   HISTORY  Chief Complaint Shortness of Breath    HPI Stanley [name] is a 58 y.o. male history of ESRD on HD, last hemodialysis Saturday, CHF, cirrhosis,, COPD, presenting with shortness of breath and abdominal distention.  The patient reports that the primary reason he came to the emergency department today is that he is scheduled for paracentesis tomorrow, but "cannot stand it anymore" regarding his ascites.  He has also been experiencing shortness of breath and was found to be hypoxic to 91% on room air upon EMS arrival.  He was treated with a DuoNeb with improvement.  Positive nausea without vomiting; no constipation or diarrhea.  No focal abdominal pain.  Past Medical History:  Diagnosis Date  . Alcohol abuse   . CHF (congestive heart failure) ([place])   . Cirrhosis ([place])   . Coronary artery disease 2009  . Drug abuse ([place])   . End stage renal disease on dialysis [facility]) NEPHROLOGIST-   [facility]  IN [place]   HEMODIALYSIS --   TUES/  THURS/  SAT  . Gastrointestinal bleed 06/13/2017   From chart...hx of multiple GI bleeds  . GERD (gastroesophageal reflux disease)   . Hyperlipidemia   . Hypertension   . PAD (peripheral artery disease) ([place])   . Renal insufficiency    Per pt, 32 oz fluid restriction per day  . S/P triple vessel bypass 06/09/2016   2009ish  . Suicidal ideation    & HOMICIDAL IDEATION --  06-16-2013   ADMITTED TO BEHAVIOR HEALTH    Patient Active Problem List   Diagnosis Date Noted  . Healthcare-associated pneumonia 01/15/2018  . Acute on chronic respiratory failure ([place]) 01/15/2018  . Acute pulmonary edema ([place]) 12/23/2017  . Acute respiratory failure ([place]) 10/29/2017  . ESRD (end stage renal disease) on dialysis ([place]) 07/28/2017  .  Protein-calorie malnutrition, severe 06/14/2017  . Encounter for dialysis [facility])   . Palliative care by specialist   . Goals of care, counseling/discussion   . Malnutrition of moderate degree 06/05/2017  . Secondary esophageal varices without bleeding ([place])   . Stomach irritation   . Idiopathic esophageal varices without bleeding ([place])   . Alcoholic hepatitis with ascites 05/24/2017  . ESRD (end stage renal disease) ([place]) 04/28/2017  . Uremia 03/08/2017  . ESRD on hemodialysis ([place]) 03/03/2017  . Weakness 02/28/2017  . Hypocalcemia 02/22/2017  . Shortness of breath 11/26/2016  . COPD (chronic obstructive pulmonary disease) ([place]) 10/30/2016  . COPD exacerbation ([place]) 10/29/2016  . Anemia   . Heme positive stool   . Ulceration of intestine   . Benign neoplasm of transverse colon   . Acute gastrointestinal hemorrhage   . Esophageal candidiasis ([place])   . Angiodysplasia of intestinal tract   . Acute respiratory failure with hypoxia ([place]) 07/03/2016  . GI bleeding 06/24/2016  . Rectal bleeding 06/14/2016  . Anemia of chronic disease 06/01/2016  . MRSA carrier 06/01/2016  . Chronic renal failure 05/23/2016  . Ischemic heart disease 05/23/2016  . Angiodysplasia of small intestine   . Melena   . Small bowel bleed not requiring more than 4 units of blood in 24 hours, ICU, or surgery   . Anemia due to chronic blood loss   . Abdominal pain 05/05/2016  . Acute posthemorrhagic anemia 04/17/2016  . Gastrointestinal bleed 04/17/2016  . History  of esophagogastroduodenoscopy (EGD) 04/17/2016  . Elevated troponin 04/17/2016  . Alcohol abuse 04/17/2016  . Upper GI bleed 01/19/2016  . Blood in stool   . Angiodysplasia of stomach and duodenum with hemorrhage   . Gastritis   . Reflux esophagitis   . GI bleed 05/16/2015  . Acute GI bleeding   . Symptomatic anemia 04/30/2015  . HTN (hypertension) 04/06/2015  . GERD (gastroesophageal reflux disease) 04/06/2015  . HLD (hyperlipidemia)  04/06/2015  . Dyspnea 04/06/2015  . Cirrhosis of liver with ascites ([place]) 04/06/2015  . Ascites 04/06/2015  . GIB (gastrointestinal bleeding) 03/23/2015  . Homicidal ideation 06/19/2013  . Suicidal intent 06/19/2013  . Homicidal ideations 06/19/2013  . Hyperkalemia 06/16/2013  . Mandible fracture ([place]) 06/05/2013  . Fracture, mandible ([place]) 06/02/2013  . Coronary atherosclerosis of native coronary artery 06/02/2013  . ESRD on dialysis ([place]) 06/02/2013  . Mandible open fracture ([place]) 06/02/2013    Past Surgical History:  Procedure Laterality Date  . A/V FISTULAGRAM Right 06/06/2017   Procedure: A/V FISTULAGRAM;  Surgeon: Katha Cabal, MD;  Location: [place] CV LAB;  Service: Cardiovascular;  Laterality: Right;  . A/V SHUNT INTERVENTION N/A 06/06/2017   Procedure: A/V SHUNT INTERVENTION;  Surgeon: Katha Cabal, MD;  Location: [place] CV LAB;  Service: Cardiovascular;  Laterality: N/A;  . AGILE CAPSULE N/A 06/19/2016   Procedure: AGILE CAPSULE;  Surgeon: [name], MD;  Location: [facility] ENDOSCOPY;  Service: Endoscopy;  Laterality: N/A;  . COLONOSCOPY WITH PROPOFOL N/A 06/18/2016   Procedure: COLONOSCOPY WITH PROPOFOL;  Surgeon: [name], MD;  Location: [facility] ENDOSCOPY;  Service: Endoscopy;  Laterality: N/A;  . COLONOSCOPY WITH PROPOFOL N/A 08/12/2016   Procedure: COLONOSCOPY WITH PROPOFOL;  Surgeon: [name], MD;  Location: [facility] ENDOSCOPY;  Service: Endoscopy;  Laterality: N/A;  . COLONOSCOPY WITH PROPOFOL N/A 05/05/2017   Procedure: COLONOSCOPY WITH PROPOFOL;  Surgeon: Manya Silvas, MD;  Location: [facility] ENDOSCOPY;  Service: Endoscopy;  Laterality: N/A;  . CORONARY ANGIOPLASTY  ?   PT UNABLE TO TELL IF  BEFORE OR AFTER  CABG  . CORONARY ARTERY BYPASS GRAFT  2008  ([place] , [place])   3 VESSEL  . DIALYSIS FISTULA CREATION  LAST SURGERY  APPOX  2008  . ENTEROSCOPY N/A 05/10/2016   Procedure: ENTEROSCOPY;  Surgeon: Jerene Bears, MD;  Location: [place];  Service:  Gastroenterology;  Laterality: N/A;  . ENTEROSCOPY N/A 08/12/2016   Procedure: ENTEROSCOPY;  Surgeon: [name], MD;  Location: [facility] ENDOSCOPY;  Service: Endoscopy;  Laterality: N/A;  . ENTEROSCOPY Left 06/03/2017   Procedure: ENTEROSCOPY;  Surgeon: Virgel Manifold, MD;  Location: [facility] ENDOSCOPY;  Service: Endoscopy;  Laterality: Left;  Procedure date will ultimately depend on when patient is medically optimized before the procedure, pending hemodialysis and blood transfusions etc. Will place on schedule and change depending on clinical status.   . ENTEROSCOPY N/A 06/05/2017   Procedure: ENTEROSCOPY;  Surgeon: Virgel Manifold, MD;  Location: [facility] ENDOSCOPY;  Service: Endoscopy;  Laterality: N/A;  . ENTEROSCOPY N/A 06/15/2017   Procedure: Push ENTEROSCOPY;  Surgeon: [name], MD;  Location: [facility] ENDOSCOPY;  Service: Endoscopy;  Laterality: N/A;  . ESOPHAGOGASTRODUODENOSCOPY N/A 05/07/2015   Procedure: ESOPHAGOGASTRODUODENOSCOPY (EGD);  Surgeon: Hulen Luster, MD;  Location: [facility] ENDOSCOPY;  Service: Endoscopy;  Laterality: N/A;  . ESOPHAGOGASTRODUODENOSCOPY (EGD) WITH PROPOFOL N/A 05/17/2015   Procedure: ESOPHAGOGASTRODUODENOSCOPY (EGD) WITH PROPOFOL;  Surgeon: [name], MD;  Location: [facility] ENDOSCOPY;  Service: Endoscopy;  Laterality: N/A;  . ESOPHAGOGASTRODUODENOSCOPY (  EGD) WITH PROPOFOL N/A 01/20/2016   Procedure: ESOPHAGOGASTRODUODENOSCOPY (EGD) WITH PROPOFOL;  Surgeon: [name], MD;  Location: [facility] ENDOSCOPY;  Service: Endoscopy;  Laterality: N/A;  . ESOPHAGOGASTRODUODENOSCOPY (EGD) WITH PROPOFOL N/A 04/17/2016   Procedure: ESOPHAGOGASTRODUODENOSCOPY (EGD) WITH PROPOFOL;  Surgeon: Lin Landsman, MD;  Location: [facility] ENDOSCOPY;  Service: Gastroenterology;  Laterality: N/A;  . ESOPHAGOGASTRODUODENOSCOPY (EGD) WITH PROPOFOL  05/09/2016   Procedure: ESOPHAGOGASTRODUODENOSCOPY (EGD) WITH PROPOFOL;  Surgeon: Jerene Bears, MD;  Location: [place];  Service: Endoscopy;;  .  ESOPHAGOGASTRODUODENOSCOPY (EGD) WITH PROPOFOL N/A 06/16/2016   Procedure: ESOPHAGOGASTRODUODENOSCOPY (EGD) WITH PROPOFOL;  Surgeon: [name], MD;  Location: [facility] ENDOSCOPY;  Service: Endoscopy;  Laterality: N/A;  . ESOPHAGOGASTRODUODENOSCOPY (EGD) WITH PROPOFOL N/A 05/05/2017   Procedure: ESOPHAGOGASTRODUODENOSCOPY (EGD) WITH PROPOFOL;  Surgeon: Manya Silvas, MD;  Location: [facility] ENDOSCOPY;  Service: Endoscopy;  Laterality: N/A;  . ESOPHAGOGASTRODUODENOSCOPY (EGD) WITH PROPOFOL N/A 06/15/2017   Procedure: ESOPHAGOGASTRODUODENOSCOPY (EGD) WITH PROPOFOL;  Surgeon: [name], MD;  Location: [facility] ENDOSCOPY;  Service: Endoscopy;  Laterality: N/A;  . GIVENS CAPSULE STUDY N/A 05/07/2016   Procedure: GIVENS CAPSULE STUDY;  Surgeon: Doran Stabler, MD;  Location: [place];  Service: Endoscopy;  Laterality: N/A;  . MANDIBULAR HARDWARE REMOVAL N/A 07/29/2013   Procedure: REMOVAL OF ARCH BARS;  Surgeon: Theodoro Kos, DO;  Location: [place];  Service: Plastics;  Laterality: N/A;  . ORIF MANDIBULAR FRACTURE N/A 06/05/2013   Procedure: REPAIR OF MANDIBULAR FRACTURE x 2 with maxillo-mandibular fixation ;  Surgeon: Theodoro Kos, DO;  Location: [place];  Service: Plastics;  Laterality: N/A;  . PARACENTESIS    . PERIPHERAL ARTERIAL STENT GRAFT Left     Current Outpatient Rx  . Order #: [id number] Class: No Print  . Order #: [id number] Class: Print  . Order #: [id number] Class: Historical Med  . Order #: [id number] Class: Print  . Order #: [id number] Class: Normal  . Order #: [id number] Class: Historical Med  . Order #: [id number] Class: Print  . Order #: [id number] Class: Normal  . Order #: [id number] Class: Normal  . Order #: [id number] Class: Historical Med  . Order #: [id number] Class: Print  . Order #: [id number] Class: Print    Allergies Patient has no known allergies.  Family History  Problem Relation Age of Onset  . Colon cancer Mother   . Cancer Father   . Cancer Sister   . Kidney  disease Brother     Social History Social History   Tobacco Use  . Smoking status: Current Every Day Smoker    Packs/day: 0.15    Years: 40.00    Pack years: 6.00    Types: Cigarettes  . Smokeless tobacco: Never Used  Substance Use Topics  . Alcohol use: No    Comment: pt reports quitting after learning about cirrhosis  . Drug use: No    Frequency: 7.0 times per week    Types: Marijuana, Cocaine    Review of Systems Constitutional: No fever/chills.  No lightheadedness or syncope.  No diaphoresis. Eyes: No visual changes. ENT: No sore throat. No congestion or rhinorrhea. Cardiovascular: Denies chest pain. Denies palpitations. Respiratory: Positive shortness of breath.  No cough. Gastrointestinal: As of abdominal distention with associated diffuse nonfocal abdominal pain.  Positive nausea, no vomiting.  No diarrhea.  No constipation. Genitourinary: No longer makes urine. Musculoskeletal: Negative for back pain. Skin: Negative for rash. Neurological: Negative for headaches. No focal numbness, tingling or weakness.     ____________________________________________   PHYSICAL EXAM:  VITAL SIGNS: ED Triage Vitals  Enc Vitals Group     BP --      Pulse Rate 04/03/18 0830 81     Resp 04/03/18 0830 16     Temp 04/03/18 0830 98 F (36.7 C)     Temp Source 04/03/18 0830 Oral     SpO2 04/03/18 0829 98 %     Weight 04/03/18 0833 170 lb (77.1 kg)     Height 04/03/18 0833 6\' 3"  (1.905 m)     Head Circumference --      Peak Flow --      Pain Score 04/03/18 0830 9     Pain Loc --      Pain Edu? --      Excl. in [place]? --     Constitutional: Alert and oriented. Answers questions appropriately.  Chronically ill-appearing. Eyes: Conjunctivae are normal.  EOMI. No scleral icterus. Head: Atraumatic. Nose: No congestion/rhinnorhea. Mouth/Throat: Mucous membranes are moist.  Neck: No stridor.  Supple.  Positive JVD. Cardiovascular: Normal rate, regular rhythm. No murmurs, rubs  or gallops.  Patient has a permacath in the right upper chest that is clean dry and intact without any purulent discharge, fluctuance or erythema. Respiratory: Normal respiratory effort.  No accessory muscle use or retractions. Lungs CTAB.  No wheezes, rales or ronchi. Gastrointestinal: Soft, distended.  No reproducible tenderness palpation on examination.  No guarding or rebound.  No peritoneal signs. Musculoskeletal: No LE edema. No ttp in the calves or palpable cords.  Negative Homan's sign. Neurologic:  A&Ox3.  Speech is clear.  Face and smile are symmetric.  EOMI.  Moves all extremities well. Skin:  Skin is warm, dry and intact. No rash noted. Psychiatric: Mood and affect are normal.   ____________________________________________   LABS (all labs ordered are listed, but only abnormal results are displayed)  Labs Reviewed  CBC - Abnormal; Notable for the following components:      Result Value   RBC 4.06 (*)    Hemoglobin 10.0 (*)    HCT 35.7 (*)    MCH 24.6 (*)    MCHC 28.0 (*)    RDW 20.6 (*)    All other components within normal limits  TROPONIN I - Abnormal; Notable for the following components:   Troponin I 0.05 (*)    All other components within normal limits  COMPREHENSIVE METABOLIC PANEL - Abnormal; Notable for the following components:   Potassium 5.9 (*)    Chloride 96 (*)    CO2 34 (*)    BUN 35 (*)    Creatinine, Ser 7.49 (*)    Albumin 2.9 (*)    Alkaline Phosphatase 148 (*)    GFR calc non Af Amer 7 (*)    GFR calc Af Amer 8 (*)    All other components within normal limits   ____________________________________________  EKG  ED ECG REPORT I, Anne-Caroline Mariea Clonts, the attending physician, personally viewed and interpreted this ECG.   Date: 04/03/2018  EKG Time: 828  Rate: 82  Rhythm: normal sinus rhythm  Axis: normal  Intervals:prolonged QTc  ST&T Change: No STEMI  ____________________________________________  RADIOLOGY  Dg Chest Portable 1  View  Result Date: 04/03/2018 CLINICAL DATA:  Shortness of breath, tightness in lower abdomen, 90% oxygen saturation on room air at arrival EXAM: PORTABLE CHEST 1 VIEW COMPARISON:  03/26/2018 FINDINGS: RIGHT jugular central venous catheter with tip projecting over SVC. Enlargement of cardiac silhouette post CABG. Slight pulmonary vascular congestion. Atherosclerotic calcifications aorta. Persistent interstitial prominence which likely represents  mild persistent pulmonary edema. Minimal bibasilar atelectasis. No gross pleural effusion or pneumothorax. Healing fractures of the lateral RIGHT ninth and tenth ribs. IMPRESSION: Enlargement of cardiac silhouette with pulmonary vascular congestion and probable mild pulmonary edema. Healing fractures of the lateral RIGHT ninth and tenth ribs. Electronically Signed   By: Lavonia Dana M.D.   On: 04/03/2018 09:01    ____________________________________________   PROCEDURES  Procedure(s) performed: None  Procedures  Critical Care performed: No ____________________________________________   INITIAL IMPRESSION / ASSESSMENT AND PLAN / ED COURSE  Pertinent labs & imaging results that were available during my care of the patient were reviewed by me and considered in my medical decision making (see chart for details).  58 y.o. male with ESRD on HD, cirrhosis w/ chronic ascites, presenting w/ ascites and abdominal distention.  Overall, the primary issue with this patient is fluid overload and likely pulmonary edema, not ascites.  His EKG is not suggestive of peaked T waves or prolonged Q RS, however, he likely needs dialysis today and I have spoken with nephrology who have put him on the list; the earliest they will be able to dialyze him as this afternoon.  From an ascites standpoint, the patient has no suggestion of SBP, and this is likely acute on chronic ascites buildup.  He may have some mechanical obstruction that is worsening his pulmonary status, but  overall, there is no indication for acute paracentesis and he already has an appointment for outpatient paracentesis tomorrow.  Plan reevaluation for final disposition; dialysis through the ED versus admission for dialysis.  ----------------------------------------- 9:43 AM on 04/03/2018 -----------------------------------------  The patient's work-up in the emergency department does show a need for dialysis with some fluid overload, however the patient is not in extremis and the nephrologist here has been able to contact the patient's dialysis center, who will be able to dialyze him this morning.  I will plan immediate discharge because he will be able to receive dialysis if he arrives prior to noon.  There is no acute or emergent indication for paracentesis and the patient already has an appointment for outpatient paracentesis tomorrow, so I have encouraged him to keep that appointment.  At this time, the patient will receive calcium gluconate for discharge.  Understands follow-up instructions as well as return precautions. ____________________________________________  FINAL CLINICAL IMPRESSION(S) / ED DIAGNOSES  Final diagnoses:  Hyperkalemia  Acute pulmonary edema (HCC)  Shortness of breath  Ascites due to alcoholic cirrhosis ([place])         NEW MEDICATIONS STARTED DURING THIS VISIT:  New Prescriptions   No medications on file      Eula Listen, MD 04/03/18 [phone]

## 2018-04-03 NOTE — ED Notes (Signed)
Cab called for pt to be discharge to diaylsis

## 2018-04-04 ENCOUNTER — Ambulatory Visit
Admission: RE | Admit: 2018-04-04 | Discharge: 2018-04-04 | Disposition: A | Discharge status: Home / Self Care | Payer: Medicare Other | Source: Ambulatory Visit | Attending: Nephrology | Admitting: Nephrology

## 2018-04-04 DIAGNOSIS — R188 Other ascites: Secondary | ICD-10-CM | POA: Diagnosis not present

## 2018-04-04 MED ORDER — ALBUMIN HUMAN 25 % IV SOLN
INTRAVENOUS | Status: AC
  Filled 2018-04-04: qty 100

## 2018-04-04 MED ORDER — ALBUMIN HUMAN 25 % IV SOLN
25.0000 g | Freq: Once | INTRAVENOUS | Status: AC
  Administered 2018-04-04: 25 g via INTRAVENOUS

## 2018-04-05 DIAGNOSIS — N186 End stage renal disease: Secondary | ICD-10-CM | POA: Diagnosis not present

## 2018-04-05 DIAGNOSIS — D509 Iron deficiency anemia, unspecified: Secondary | ICD-10-CM | POA: Diagnosis not present

## 2018-04-05 DIAGNOSIS — N2581 Secondary hyperparathyroidism of renal origin: Secondary | ICD-10-CM | POA: Diagnosis not present

## 2018-04-05 DIAGNOSIS — D631 Anemia in chronic kidney disease: Secondary | ICD-10-CM | POA: Diagnosis not present

## 2018-04-05 DIAGNOSIS — Z992 Dependence on renal dialysis: Secondary | ICD-10-CM | POA: Diagnosis not present

## 2018-04-08 ENCOUNTER — Emergency Department
Admission: EM | Admit: 2018-04-08 | Discharge: 2018-04-08 | Disposition: A | Discharge status: Home / Self Care | Payer: Medicare Other | Attending: Emergency Medicine | Admitting: Emergency Medicine

## 2018-04-08 ENCOUNTER — Emergency Department: Payer: Medicare Other

## 2018-04-08 DIAGNOSIS — R52 Pain, unspecified: Secondary | ICD-10-CM | POA: Diagnosis not present

## 2018-04-08 DIAGNOSIS — R0902 Hypoxemia: Secondary | ICD-10-CM | POA: Diagnosis not present

## 2018-04-08 DIAGNOSIS — F17210 Nicotine dependence, cigarettes, uncomplicated: Secondary | ICD-10-CM | POA: Insufficient documentation

## 2018-04-08 DIAGNOSIS — R0600 Dyspnea, unspecified: Secondary | ICD-10-CM | POA: Diagnosis not present

## 2018-04-08 DIAGNOSIS — Z955 Presence of coronary angioplasty implant and graft: Secondary | ICD-10-CM | POA: Diagnosis not present

## 2018-04-08 DIAGNOSIS — Z79899 Other long term (current) drug therapy: Secondary | ICD-10-CM | POA: Diagnosis not present

## 2018-04-08 DIAGNOSIS — R609 Edema, unspecified: Secondary | ICD-10-CM | POA: Diagnosis not present

## 2018-04-08 DIAGNOSIS — N186 End stage renal disease: Secondary | ICD-10-CM | POA: Diagnosis not present

## 2018-04-08 DIAGNOSIS — I509 Heart failure, unspecified: Secondary | ICD-10-CM | POA: Insufficient documentation

## 2018-04-08 DIAGNOSIS — I2510 Atherosclerotic heart disease of native coronary artery without angina pectoris: Secondary | ICD-10-CM | POA: Diagnosis not present

## 2018-04-08 DIAGNOSIS — R0602 Shortness of breath: Secondary | ICD-10-CM | POA: Diagnosis not present

## 2018-04-08 DIAGNOSIS — J449 Chronic obstructive pulmonary disease, unspecified: Secondary | ICD-10-CM | POA: Diagnosis not present

## 2018-04-08 DIAGNOSIS — I132 Hypertensive heart and chronic kidney disease with heart failure and with stage 5 chronic kidney disease, or end stage renal disease: Secondary | ICD-10-CM | POA: Insufficient documentation

## 2018-04-08 DIAGNOSIS — R062 Wheezing: Secondary | ICD-10-CM | POA: Diagnosis not present

## 2018-04-08 LAB — CBC WITH DIFFERENTIAL/PLATELET
ABS IMMATURE GRANULOCYTES: 0.02 10*3/uL (ref 0.00–0.07)
BASOS ABS: 0.1 10*3/uL (ref 0.0–0.1)
Basophils Relative: 1 %
Eosinophils Absolute: 0.1 10*3/uL (ref 0.0–0.5)
Eosinophils Relative: 2 %
HEMATOCRIT: 40 % (ref 39.0–52.0)
Hemoglobin: 11.1 g/dL — ABNORMAL LOW (ref 13.0–17.0)
Immature Granulocytes: 0 %
Lymphocytes Relative: 14 %
Lymphs Abs: 0.9 10*3/uL (ref 0.7–4.0)
MCH: 24.9 pg — ABNORMAL LOW (ref 26.0–34.0)
MCHC: 27.8 g/dL — ABNORMAL LOW (ref 30.0–36.0)
MCV: 89.7 fL (ref 80.0–100.0)
Monocytes Absolute: 0.8 10*3/uL (ref 0.1–1.0)
Monocytes Relative: 12 %
Neutro Abs: 4.4 10*3/uL (ref 1.7–7.7)
Neutrophils Relative %: 71 %
Platelets: 229 10*3/uL (ref 150–400)
RBC: 4.46 MIL/uL (ref 4.22–5.81)
RDW: 22 % — ABNORMAL HIGH (ref 11.5–15.5)
WBC: 6.2 10*3/uL (ref 4.0–10.5)
nRBC: 0 % (ref 0.0–0.2)

## 2018-04-08 LAB — COMPREHENSIVE METABOLIC PANEL
ALBUMIN: 3 g/dL — AB (ref 3.5–5.0)
ALT: 13 U/L (ref 0–44)
AST: 17 U/L (ref 15–41)
Alkaline Phosphatase: 124 U/L (ref 38–126)
Anion gap: 10 (ref 5–15)
BUN: 39 mg/dL — ABNORMAL HIGH (ref 6–20)
CO2: 38 mmol/L — ABNORMAL HIGH (ref 22–32)
Calcium: 9.4 mg/dL (ref 8.9–10.3)
Chloride: 93 mmol/L — ABNORMAL LOW (ref 98–111)
Creatinine, Ser: 9.15 mg/dL — ABNORMAL HIGH (ref 0.61–1.24)
GFR calc Af Amer: 7 mL/min — ABNORMAL LOW (ref >60)
GFR calc non Af Amer: 6 mL/min — ABNORMAL LOW (ref >60)
GLUCOSE: 93 mg/dL (ref 70–99)
Potassium: 5.7 mmol/L — ABNORMAL HIGH (ref 3.5–5.1)
Sodium: 141 mmol/L (ref 135–145)
Total Bilirubin: 0.8 mg/dL (ref 0.3–1.2)
Total Protein: 6.8 g/dL (ref 6.5–8.1)

## 2018-04-08 LAB — TROPONIN I: Troponin I: 0.07 ng/mL (ref <0.03)

## 2018-04-08 LAB — BRAIN NATRIURETIC PEPTIDE: B Natriuretic Peptide: 4474 pg/mL — ABNORMAL HIGH (ref 0.0–100.0)

## 2018-04-08 MED ORDER — IPRATROPIUM-ALBUTEROL 0.5-2.5 (3) MG/3ML IN SOLN
3.0000 mL | Freq: Once | RESPIRATORY_TRACT | Status: AC
  Administered 2018-04-08: 3 mL via RESPIRATORY_TRACT
  Filled 2018-04-08: qty 3

## 2018-04-08 NOTE — ED Provider Notes (Signed)
[facility] Emergency Department Provider Note   ____________________________________________   First MD Initiated Contact with Patient 04/08/18 1618     (approximate)  I have reviewed the triage vital signs and the nursing notes.   HISTORY  Chief Complaint Shortness of Breath and Vascular Access Problem   HPI Stanley [name] is a 58 y.o. male patient has a history of COPD CHF gets dialysis.  He reports he missed his dialysis on Saturday.  His next dialysis is on Tuesday.  Today is Sunday.  He says he is short of breath.  Came in for check.  O2 sats are in the high 90s to 100%.  He is afebrile his blood pressure is good.   Past Medical History:  Diagnosis Date  . Alcohol abuse   . CHF (congestive heart failure) ([place])   . Cirrhosis ([place])   . Coronary artery disease 2009  . Drug abuse ([place])   . End stage renal disease on dialysis [facility]) NEPHROLOGIST-   DR [facility]  IN [place]   HEMODIALYSIS --   TUES/  THURS/  SAT  . Gastrointestinal bleed 06/13/2017   From chart...hx of multiple GI bleeds  . GERD (gastroesophageal reflux disease)   . Hyperlipidemia   . Hypertension   . PAD (peripheral artery disease) ([place])   . Renal insufficiency    Per pt, 32 oz fluid restriction per day  . S/P triple vessel bypass 06/09/2016   2009ish  . Suicidal ideation    & HOMICIDAL IDEATION --  06-16-2013   ADMITTED TO BEHAVIOR HEALTH    Patient Active Problem List   Diagnosis Date Noted  . Healthcare-associated pneumonia 01/15/2018  . Acute on chronic respiratory failure ([place]) 01/15/2018  . Acute pulmonary edema ([place]) 12/23/2017  . Acute respiratory failure ([place]) 10/29/2017  . ESRD (end stage renal disease) on dialysis ([place]) 07/28/2017  . Protein-calorie malnutrition, severe 06/14/2017  . Encounter for dialysis [facility])   . Palliative care by specialist   . Goals of care, counseling/discussion   . Malnutrition of moderate degree 06/05/2017  . Secondary esophageal  varices without bleeding ([place])   . Stomach irritation   . Idiopathic esophageal varices without bleeding ([place])   . Alcoholic hepatitis with ascites 05/24/2017  . ESRD (end stage renal disease) ([place]) 04/28/2017  . Uremia 03/08/2017  . ESRD on hemodialysis ([place]) 03/03/2017  . Weakness 02/28/2017  . Hypocalcemia 02/22/2017  . Shortness of breath 11/26/2016  . COPD (chronic obstructive pulmonary disease) ([place]) 10/30/2016  . COPD exacerbation ([place]) 10/29/2016  . Anemia   . Heme positive stool   . Ulceration of intestine   . Benign neoplasm of transverse colon   . Acute gastrointestinal hemorrhage   . Esophageal candidiasis ([place])   . Angiodysplasia of intestinal tract   . Acute respiratory failure with hypoxia ([place]) 07/03/2016  . GI bleeding 06/24/2016  . Rectal bleeding 06/14/2016  . Anemia of chronic disease 06/01/2016  . MRSA carrier 06/01/2016  . Chronic renal failure 05/23/2016  . Ischemic heart disease 05/23/2016  . Angiodysplasia of small intestine   . Melena   . Small bowel bleed not requiring more than 4 units of blood in 24 hours, ICU, or surgery   . Anemia due to chronic blood loss   . Abdominal pain 05/05/2016  . Acute posthemorrhagic anemia 04/17/2016  . Gastrointestinal bleed 04/17/2016  . History of esophagogastroduodenoscopy (EGD) 04/17/2016  . Elevated troponin 04/17/2016  . Alcohol abuse 04/17/2016  . Upper GI bleed 01/19/2016  .  Blood in stool   . Angiodysplasia of stomach and duodenum with hemorrhage   . Gastritis   . Reflux esophagitis   . GI bleed 05/16/2015  . Acute GI bleeding   . Symptomatic anemia 04/30/2015  . HTN (hypertension) 04/06/2015  . GERD (gastroesophageal reflux disease) 04/06/2015  . HLD (hyperlipidemia) 04/06/2015  . Dyspnea 04/06/2015  . Cirrhosis of liver with ascites ([place]) 04/06/2015  . Ascites 04/06/2015  . GIB (gastrointestinal bleeding) 03/23/2015  . Homicidal ideation 06/19/2013  . Suicidal intent 06/19/2013  . Homicidal  ideations 06/19/2013  . Hyperkalemia 06/16/2013  . Mandible fracture ([place]) 06/05/2013  . Fracture, mandible ([place]) 06/02/2013  . Coronary atherosclerosis of native coronary artery 06/02/2013  . ESRD on dialysis ([place]) 06/02/2013  . Mandible open fracture ([place]) 06/02/2013    Past Surgical History:  Procedure Laterality Date  . A/V FISTULAGRAM Right 06/06/2017   Procedure: A/V FISTULAGRAM;  Surgeon: Katha Cabal, MD;  Location: [place] CV [facility];  Service: Cardiovascular;  Laterality: Right;  . A/V SHUNT INTERVENTION N/A 06/06/2017   Procedure: A/V SHUNT INTERVENTION;  Surgeon: Katha Cabal, MD;  Location: [place] CV [facility];  Service: Cardiovascular;  Laterality: N/A;  . AGILE CAPSULE N/A 06/19/2016   Procedure: AGILE CAPSULE;  Surgeon: [name], MD;  Location: [facility] ENDOSCOPY;  Service: Endoscopy;  Laterality: N/A;  . COLONOSCOPY WITH PROPOFOL N/A 06/18/2016   Procedure: COLONOSCOPY WITH PROPOFOL;  Surgeon: [name], MD;  Location: [facility] ENDOSCOPY;  Service: Endoscopy;  Laterality: N/A;  . COLONOSCOPY WITH PROPOFOL N/A 08/12/2016   Procedure: COLONOSCOPY WITH PROPOFOL;  Surgeon: [name], MD;  Location: [facility] ENDOSCOPY;  Service: Endoscopy;  Laterality: N/A;  . COLONOSCOPY WITH PROPOFOL N/A 05/05/2017   Procedure: COLONOSCOPY WITH PROPOFOL;  Surgeon: Manya Silvas, MD;  Location: [facility] ENDOSCOPY;  Service: Endoscopy;  Laterality: N/A;  . CORONARY ANGIOPLASTY  ?   PT UNABLE TO TELL IF  BEFORE OR AFTER  CABG  . CORONARY ARTERY BYPASS GRAFT  2008  ([place] , [place])   3 VESSEL  . DIALYSIS FISTULA CREATION  LAST SURGERY  APPOX  2008  . ENTEROSCOPY N/A 05/10/2016   Procedure: ENTEROSCOPY;  Surgeon: Jerene Bears, MD;  Location: [place];  Service: Gastroenterology;  Laterality: N/A;  . ENTEROSCOPY N/A 08/12/2016   Procedure: ENTEROSCOPY;  Surgeon: [name], MD;  Location: [facility] ENDOSCOPY;  Service: Endoscopy;  Laterality: N/A;  . ENTEROSCOPY Left 06/03/2017   Procedure:  ENTEROSCOPY;  Surgeon: Virgel Manifold, MD;  Location: [facility] ENDOSCOPY;  Service: Endoscopy;  Laterality: Left;  Procedure date will ultimately depend on when patient is medically optimized before the procedure, pending hemodialysis and blood transfusions etc. Will place on schedule and change depending on clinical status.   . ENTEROSCOPY N/A 06/05/2017   Procedure: ENTEROSCOPY;  Surgeon: Virgel Manifold, MD;  Location: [facility] ENDOSCOPY;  Service: Endoscopy;  Laterality: N/A;  . ENTEROSCOPY N/A 06/15/2017   Procedure: Push ENTEROSCOPY;  Surgeon: [name], MD;  Location: [facility] ENDOSCOPY;  Service: Endoscopy;  Laterality: N/A;  . ESOPHAGOGASTRODUODENOSCOPY N/A 05/07/2015   Procedure: ESOPHAGOGASTRODUODENOSCOPY (EGD);  Surgeon: Hulen Luster, MD;  Location: [facility] ENDOSCOPY;  Service: Endoscopy;  Laterality: N/A;  . ESOPHAGOGASTRODUODENOSCOPY (EGD) WITH PROPOFOL N/A 05/17/2015   Procedure: ESOPHAGOGASTRODUODENOSCOPY (EGD) WITH PROPOFOL;  Surgeon: [name], MD;  Location: [facility] ENDOSCOPY;  Service: Endoscopy;  Laterality: N/A;  . ESOPHAGOGASTRODUODENOSCOPY (EGD) WITH PROPOFOL N/A 01/20/2016   Procedure: ESOPHAGOGASTRODUODENOSCOPY (EGD) WITH PROPOFOL;  Surgeon: [name], MD;  Location: [facility] ENDOSCOPY;  Service: Endoscopy;  Laterality: N/A;  . ESOPHAGOGASTRODUODENOSCOPY (EGD) WITH PROPOFOL N/A 04/17/2016   Procedure: ESOPHAGOGASTRODUODENOSCOPY (EGD) WITH PROPOFOL;  Surgeon: Lin Landsman, MD;  Location: [facility] ENDOSCOPY;  Service: Gastroenterology;  Laterality: N/A;  . ESOPHAGOGASTRODUODENOSCOPY (EGD) WITH PROPOFOL  05/09/2016   Procedure: ESOPHAGOGASTRODUODENOSCOPY (EGD) WITH PROPOFOL;  Surgeon: Jerene Bears, MD;  Location: [place];  Service: Endoscopy;;  . ESOPHAGOGASTRODUODENOSCOPY (EGD) WITH PROPOFOL N/A 06/16/2016   Procedure: ESOPHAGOGASTRODUODENOSCOPY (EGD) WITH PROPOFOL;  Surgeon: [name], MD;  Location: [facility] ENDOSCOPY;  Service: Endoscopy;  Laterality: N/A;  . ESOPHAGOGASTRODUODENOSCOPY  (EGD) WITH PROPOFOL N/A 05/05/2017   Procedure: ESOPHAGOGASTRODUODENOSCOPY (EGD) WITH PROPOFOL;  Surgeon: Manya Silvas, MD;  Location: [facility] ENDOSCOPY;  Service: Endoscopy;  Laterality: N/A;  . ESOPHAGOGASTRODUODENOSCOPY (EGD) WITH PROPOFOL N/A 06/15/2017   Procedure: ESOPHAGOGASTRODUODENOSCOPY (EGD) WITH PROPOFOL;  Surgeon: [name], MD;  Location: [facility] ENDOSCOPY;  Service: Endoscopy;  Laterality: N/A;  . GIVENS CAPSULE STUDY N/A 05/07/2016   Procedure: GIVENS CAPSULE STUDY;  Surgeon: Doran Stabler, MD;  Location: [place];  Service: Endoscopy;  Laterality: N/A;  . MANDIBULAR HARDWARE REMOVAL N/A 07/29/2013   Procedure: REMOVAL OF ARCH BARS;  Surgeon: Theodoro Kos, DO;  Location: [place];  Service: Plastics;  Laterality: N/A;  . ORIF MANDIBULAR FRACTURE N/A 06/05/2013   Procedure: REPAIR OF MANDIBULAR FRACTURE x 2 with maxillo-mandibular fixation ;  Surgeon: Theodoro Kos, DO;  Location: [place];  Service: Plastics;  Laterality: N/A;  . PARACENTESIS    . PERIPHERAL ARTERIAL STENT GRAFT Left     Prior to Admission medications   Medication Sig Start Date End Date Taking? Authorizing Provider  acetaminophen (TYLENOL) 325 MG tablet Take 2 tablets (650 mg total) by mouth every 6 (six) hours as needed for mild pain (or Fever >/= 101). 03/27/18   Gouru, Illene Silver, MD  budesonide-formoterol (SYMBICORT) 160-4.5 MCG/ACT inhaler Inhale 2 puffs into the lungs daily. Patient not taking: Reported on 02/20/2018 11/16/16   Vaughan Basta, MD  calcium acetate (PHOSLO) 667 MG capsule Take 2,001 mg by mouth 3 (three) times daily.    [provider]  doxycycline (VIBRA-TABS) 100 MG tablet Take 1 tablet (100 mg total) by mouth every 12 (twelve) hours. 03/27/18   Nicholes Mango, MD  folic acid (FOLVITE) 1 MG tablet Take 1 tablet (1 mg total) by mouth daily. Patient not taking: Reported on 02/20/2018 10/31/17   Fritzi Mandes, MD  gabapentin (NEURONTIN) 300 MG capsule Take 300 mg by  mouth 3 (three) times daily.  08/16/16   [provider]  ipratropium-albuterol (DUONEB) 0.5-2.5 (3) MG/3ML SOLN Take 3 mLs by nebulization every 6 (six) hours as needed. Patient not taking: Reported on 02/20/2018 11/28/16   Loletha Grayer, MD  multivitamin (RENA-VIT) TABS tablet Take 1 tablet by mouth at bedtime. 10/31/17   Fritzi Mandes, MD  mupirocin ointment (BACTROBAN) 2 % Place 1 application into the nose 2 (two) times daily. Patient not taking: Reported on 02/20/2018 01/19/18   Vaughan Basta, MD  omeprazole (PRILOSEC) 40 MG capsule Take 40 mg by mouth daily. 09/08/17   [provider]  Spacer/Aero Chamber Mouthpiece MISC 1 Units by Does not apply route every 4 (four) hours as needed (wheezing). Patient not taking: Reported on 02/20/2018 02/19/17   Darel Hong, MD  tiotropium (SPIRIVA HANDIHALER) 18 MCG inhalation capsule Place 1 capsule (18 mcg total) into inhaler and inhale daily. Patient not taking: Reported on 02/20/2018 01/30/18 04/30/18  Demetrios Loll, MD    Allergies Patient has  no known allergies.  Family History  Problem Relation Age of Onset  . Colon cancer Mother   . Cancer Father   . Cancer Sister   . Kidney disease Brother     Social History Social History   Tobacco Use  . Smoking status: Current Every Day Smoker    Packs/day: 0.15    Years: 40.00    Pack years: 6.00    Types: Cigarettes  . Smokeless tobacco: Never Used  Substance Use Topics  . Alcohol use: No    Comment: pt reports quitting after learning about cirrhosis  . Drug use: No    Frequency: 7.0 times per week    Types: Marijuana, Cocaine    Review of Systems  Constitutional: No fever/chills Eyes: No visual changes. ENT: No sore throat. Cardiovascular: Denies chest pain. Respiratory:  shortness of breath. Gastrointestinal: No abdominal pain.  No nausea, no vomiting.  No diarrhea.  No constipation. Genitourinary: Negative for dysuria. Musculoskeletal: Negative for back  pain. Skin: Negative for rash. Neurological: Negative for headaches, focal weakness   ____________________________________________   PHYSICAL EXAM:  VITAL SIGNS: ED Triage Vitals  Enc Vitals Group     BP 04/08/18 1615 (!) 131/98     Pulse Rate 04/08/18 1615 80     Resp 04/08/18 1615 (!) 22     Temp 04/08/18 1615 98.2 F (36.8 C)     Temp Source 04/08/18 1615 Oral     SpO2 04/08/18 1615 100 %     Weight 04/08/18 1617 165 lb (74.8 kg)     Height --      Head Circumference --      Peak Flow --      Pain Score 04/08/18 1617 9     Pain Loc --      Pain Edu? --      Excl. in [place]? --     Constitutional: Alert and oriented. Well appearing and in no acute distress. Eyes: Conjunctivae are normal. Head: Atraumatic. Nose: No congestion/rhinnorhea. Mouth/Throat: Mucous membranes are moist.  Oropharynx non-erythematous. Neck: No stridor.   Cardiovascular: Normal rate, regular rhythm. Grossly normal heart sounds.  Good peripheral circulation. Respiratory: Normal respiratory effort.  No retractions. Lungs diffuse wheezing Gastrointestinal: Soft and nontender. No distention. No abdominal bruits. No CVA tenderness. Musculoskeletal: No lower extremity tenderness slight trace edema.   Neurologic:  Normal speech and language. No gross focal neurologic deficits are appreciated. . Skin:  Skin is warm, dry and intact. No rash noted.   ____________________________________________   LABS (all labs ordered are listed, but only abnormal results are displayed)  Labs Reviewed  COMPREHENSIVE METABOLIC PANEL - Abnormal; Notable for the following components:      Result Value   Potassium 5.7 (*)    Chloride 93 (*)    CO2 38 (*)    BUN 39 (*)    Creatinine, Ser 9.15 (*)    Albumin 3.0 (*)    GFR calc non Af Amer 6 (*)    GFR calc Af Amer 7 (*)    All other components within normal limits  BRAIN NATRIURETIC PEPTIDE - Abnormal; Notable for the following components:   B Natriuretic Peptide  4,474.0 (*)    All other components within normal limits  TROPONIN I - Abnormal; Notable for the following components:   Troponin I 0.07 (*)    All other components within normal limits  CBC WITH DIFFERENTIAL/PLATELET - Abnormal; Notable for the following components:   Hemoglobin 11.1 (*)  MCH 24.9 (*)    MCHC 27.8 (*)    RDW 22.0 (*)    All other components within normal limits   ____________________________________________  EKG  EKG read interpreted by me shows normal sinus rhythm rate of 83 right axis nonspecific ST-T wave changes similar to EKG from 04/03/2018 ____________________________________________  RADIOLOGY  ED MD interpretation:    Official radiology report(s): Dg Chest Portable 1 View  Result Date: 04/08/2018 CLINICAL DATA:  Shortness of breath, recent missed dialysis treatment EXAM: PORTABLE CHEST 1 VIEW COMPARISON:  04/03/2018 FINDINGS: Cardiac shadow remains enlarged. Postsurgical changes are again seen. Dialysis catheter is again noted. Increase in central vascular congestion is noted when compared with the prior study. No focal infiltrate or effusion is seen. IMPRESSION: Slight increase in central vascular congestion consistent with volume overload likely related to the recently missed dialysis session. Electronically Signed   By: Inez Catalina M.D.   On: 04/08/2018 17:13    ____________________________________________   PROCEDURES  Procedure(s) performed:   Procedures  Critical Care performed:   ____________________________________________   INITIAL IMPRESSION / ASSESSMENT AND PLAN / ED COURSE  Discussed patient with Dr. Juleen China.  He feels the patient does not meet criteria for emergency dialysis his sats have been okay though not gone below 95% at all.  On discharge in fact his lungs are clear after having had 2 nebs.  His EKG does not show any signs of hyperkalemia his potassium is been higher very recently.  I will let him go he will come back  tomorrow unless he can get dialysis tomorrow we will look at them again          ____________________________________________   FINAL CLINICAL IMPRESSION(S) / ED DIAGNOSES  Final diagnoses:  Dyspnea, unspecified type     ED Discharge Orders    None       Note:  This document was prepared using Dragon voice recognition software and may include unintentional dictation errors.    Nena Polio, MD 04/08/18 [phone]

## 2018-04-08 NOTE — Discharge Instructions (Signed)
Try to see if your dialysis provider can do dialysis on you tomorrow.  Otherwise return here tomorrow for recheck particularly if you are worse.  Currently you do not meet criteria for emergency dialysis.

## 2018-04-08 NOTE — ED Triage Notes (Signed)
Pt presents via EMS c/o SOB. Pt missed dialysis over the weekend due to issue with transportation. Pt abd disteneded per report.

## 2018-04-09 ENCOUNTER — Emergency Department: Payer: Medicare Other

## 2018-04-09 ENCOUNTER — Non-Acute Institutional Stay
Admission: EM | Admit: 2018-04-09 | Discharge: 2018-04-09 | Disposition: A | Discharge status: Home / Self Care | Payer: Medicare Other | Attending: Emergency Medicine | Admitting: Emergency Medicine

## 2018-04-09 ENCOUNTER — Other Ambulatory Visit: Payer: Self-pay

## 2018-04-09 DIAGNOSIS — K219 Gastro-esophageal reflux disease without esophagitis: Secondary | ICD-10-CM | POA: Insufficient documentation

## 2018-04-09 DIAGNOSIS — E875 Hyperkalemia: Secondary | ICD-10-CM | POA: Diagnosis not present

## 2018-04-09 DIAGNOSIS — Z951 Presence of aortocoronary bypass graft: Secondary | ICD-10-CM | POA: Insufficient documentation

## 2018-04-09 DIAGNOSIS — N186 End stage renal disease: Secondary | ICD-10-CM | POA: Insufficient documentation

## 2018-04-09 DIAGNOSIS — R600 Localized edema: Secondary | ICD-10-CM | POA: Diagnosis not present

## 2018-04-09 DIAGNOSIS — I120 Hypertensive chronic kidney disease with stage 5 chronic kidney disease or end stage renal disease: Secondary | ICD-10-CM | POA: Diagnosis not present

## 2018-04-09 DIAGNOSIS — Z79899 Other long term (current) drug therapy: Secondary | ICD-10-CM | POA: Insufficient documentation

## 2018-04-09 DIAGNOSIS — Q8781 Alport syndrome: Secondary | ICD-10-CM | POA: Insufficient documentation

## 2018-04-09 DIAGNOSIS — E8770 Fluid overload, unspecified: Secondary | ICD-10-CM | POA: Diagnosis not present

## 2018-04-09 DIAGNOSIS — Z992 Dependence on renal dialysis: Secondary | ICD-10-CM | POA: Insufficient documentation

## 2018-04-09 DIAGNOSIS — R188 Other ascites: Secondary | ICD-10-CM | POA: Insufficient documentation

## 2018-04-09 DIAGNOSIS — R0602 Shortness of breath: Secondary | ICD-10-CM | POA: Diagnosis not present

## 2018-04-09 DIAGNOSIS — R140 Abdominal distension (gaseous): Secondary | ICD-10-CM | POA: Diagnosis not present

## 2018-04-09 DIAGNOSIS — F17210 Nicotine dependence, cigarettes, uncomplicated: Secondary | ICD-10-CM | POA: Diagnosis not present

## 2018-04-09 DIAGNOSIS — I2510 Atherosclerotic heart disease of native coronary artery without angina pectoris: Secondary | ICD-10-CM | POA: Insufficient documentation

## 2018-04-09 DIAGNOSIS — D631 Anemia in chronic kidney disease: Secondary | ICD-10-CM | POA: Diagnosis not present

## 2018-04-09 DIAGNOSIS — E785 Hyperlipidemia, unspecified: Secondary | ICD-10-CM | POA: Insufficient documentation

## 2018-04-09 DIAGNOSIS — I272 Pulmonary hypertension, unspecified: Secondary | ICD-10-CM | POA: Diagnosis not present

## 2018-04-09 DIAGNOSIS — I132 Hypertensive heart and chronic kidney disease with heart failure and with stage 5 chronic kidney disease, or end stage renal disease: Secondary | ICD-10-CM | POA: Diagnosis not present

## 2018-04-09 DIAGNOSIS — I700 Atherosclerosis of aorta: Secondary | ICD-10-CM | POA: Diagnosis not present

## 2018-04-09 DIAGNOSIS — R0689 Other abnormalities of breathing: Secondary | ICD-10-CM | POA: Diagnosis not present

## 2018-04-09 DIAGNOSIS — R0902 Hypoxemia: Secondary | ICD-10-CM | POA: Diagnosis not present

## 2018-04-09 DIAGNOSIS — I509 Heart failure, unspecified: Secondary | ICD-10-CM | POA: Diagnosis not present

## 2018-04-09 DIAGNOSIS — Z955 Presence of coronary angioplasty implant and graft: Secondary | ICD-10-CM | POA: Diagnosis not present

## 2018-04-09 DIAGNOSIS — J449 Chronic obstructive pulmonary disease, unspecified: Secondary | ICD-10-CM | POA: Insufficient documentation

## 2018-04-09 LAB — COMPREHENSIVE METABOLIC PANEL
ALT: 13 U/L (ref 0–44)
AST: 31 U/L (ref 15–41)
Albumin: 2.9 g/dL — ABNORMAL LOW (ref 3.5–5.0)
Alkaline Phosphatase: 124 U/L (ref 38–126)
Anion gap: 13 (ref 5–15)
BUN: 45 mg/dL — AB (ref 6–20)
CO2: 34 mmol/L — ABNORMAL HIGH (ref 22–32)
Calcium: 9.1 mg/dL (ref 8.9–10.3)
Chloride: 91 mmol/L — ABNORMAL LOW (ref 98–111)
Creatinine, Ser: 10.24 mg/dL — ABNORMAL HIGH (ref 0.61–1.24)
GFR calc Af Amer: 6 mL/min — ABNORMAL LOW (ref >60)
GFR, EST NON AFRICAN AMERICAN: 5 mL/min — AB (ref >60)
Glucose, Bld: 86 mg/dL (ref 70–99)
Potassium: 6.6 mmol/L (ref 3.5–5.1)
Sodium: 138 mmol/L (ref 135–145)
Total Bilirubin: 1.3 mg/dL — ABNORMAL HIGH (ref 0.3–1.2)
Total Protein: 7.3 g/dL (ref 6.5–8.1)

## 2018-04-09 LAB — CBC WITH DIFFERENTIAL/PLATELET
Abs Immature Granulocytes: 0.03 10*3/uL (ref 0.00–0.07)
Basophils Absolute: 0 10*3/uL (ref 0.0–0.1)
Basophils Relative: 1 %
Eosinophils Absolute: 0.1 10*3/uL (ref 0.0–0.5)
Eosinophils Relative: 2 %
HCT: 38 % — ABNORMAL LOW (ref 39.0–52.0)
Hemoglobin: 11 g/dL — ABNORMAL LOW (ref 13.0–17.0)
Immature Granulocytes: 1 %
Lymphocytes Relative: 16 %
Lymphs Abs: 1 10*3/uL (ref 0.7–4.0)
MCH: 25.2 pg — ABNORMAL LOW (ref 26.0–34.0)
MCHC: 28.9 g/dL — ABNORMAL LOW (ref 30.0–36.0)
MCV: 87.2 fL (ref 80.0–100.0)
MONO ABS: 0.8 10*3/uL (ref 0.1–1.0)
MONOS PCT: 13 %
NEUTROS ABS: 4.2 10*3/uL (ref 1.7–7.7)
NEUTROS PCT: 67 %
Platelets: 239 10*3/uL (ref 150–400)
RBC: 4.36 MIL/uL (ref 4.22–5.81)
RDW: 22.2 % — ABNORMAL HIGH (ref 11.5–15.5)
SMEAR REVIEW: NORMAL
WBC: 6.2 10*3/uL (ref 4.0–10.5)
nRBC: 0 % (ref 0.0–0.2)

## 2018-04-09 MED ORDER — CHLORHEXIDINE GLUCONATE CLOTH 2 % EX PADS: 6.0000 | MEDICATED_PAD | Freq: Every day | CUTANEOUS | Status: DC

## 2018-04-09 MED ORDER — ALBUTEROL SULFATE (2.5 MG/3ML) 0.083% IN NEBU
2.5000 mg | INHALATION_SOLUTION | Freq: Once | RESPIRATORY_TRACT | Status: AC
  Administered 2018-04-09: 2.5 mg via RESPIRATORY_TRACT
  Filled 2018-04-09: qty 3

## 2018-04-09 NOTE — ED Triage Notes (Signed)
Pt arrived via ems for c/o shortness of breath and abd distention - he had 5L of fluid removed from abd Thursday and then was in the ED last night for this same complaint - returns today for same (shortness of breath and abd distention) - pt did not go to dialysis Thursday because he got "kicked off" the [facility] that was transporting him

## 2018-04-09 NOTE — Progress Notes (Signed)
Post HD Tx    04/09/18 1813  Neurological  Orientation Level Oriented X4  Respiratory  Respiratory Pattern Regular  Chest Assessment Chest expansion symmetrical  Bilateral Breath Sounds Clear;Diminished  Cough Non-productive  Cardiac  Pulse Regular  Heart Sounds S1, S2  ECG Monitor Yes  Vascular  R Radial Pulse +2  L Radial Pulse +2  Edema Generalized  Generalized Edema +2  Psychosocial  Psychosocial (WDL) WDL

## 2018-04-09 NOTE — Progress Notes (Signed)
   04/09/18 1805  Hand-Off documentation  Report given to (Full Name) Roselyn Meier, RN   Report received from (Full Name) Gara Kroner, RN   Vital Signs  Temp 98.2 F (36.8 C)  Temp Source Oral  Pulse Rate 89  Pulse Rate Source Monitor  BP 138/82  BP Location Left Arm  BP Method Automatic  Patient Position (if appropriate) Sitting  Oxygen Therapy  SpO2 99 %  O2 Device Room Air  Pulse Oximetry Type Continuous  Pain Assessment  Pain Scale 0-10  Dialysis Weight  Weight 68.9 kg  Type of Weight Post-Dialysis  Hemodialysis Catheter Right Internal jugular Double-lumen  No Placement Date or Time found.   Placed prior to admission: Yes  Orientation: Right  Access Location: Internal jugular  Hemodialysis Catheter Type: Double-lumen  Site Condition No complications  Blue Lumen Status Heparin locked  Red Lumen Status Heparin locked  Catheter fill solution Heparin 1000 units/ml  Post treatment catheter status Capped and Clamped

## 2018-04-09 NOTE — ED Notes (Signed)
Report given to dialysis and pt transported to dialysis at this time via wheelchair

## 2018-04-09 NOTE — Progress Notes (Signed)
Pre HD Tx    04/09/18 1400  Hand-Off documentation  Report given to (Full Name) Beatris Ship, RN   Report received from (Full Name) Roselyn Meier, RN   Vital Signs  Temp 98.2 F (36.8 C)  Pulse Rate 84  Pulse Rate Source Monitor  Resp 18  BP (!) 141/108  BP Location Left Arm  BP Method Automatic  Patient Position (if appropriate) Sitting  Oxygen Therapy  SpO2 92 %  O2 Device Room Air  Pulse Oximetry Type Continuous  Pain Assessment  Pain Scale 0-10  Dialysis Weight  Weight  (72.6 kg)  Type of Weight Pre-Dialysis  Time-Out for Hemodialysis  What Procedure? HD   Pt Identifiers(min of two) First/Last Name;MRN/Account#  Correct Site? Yes  Correct Side? Yes  Correct Procedure? Yes  Consents Verified? Yes  Rad Studies Available? N/A  Engineer, civil (consulting) Number 5  Station Number 3  UF/Alarm Test Passed  Conductivity: Meter 14  Conductivity: Machine  13.9  pH 7.4  Reverse Osmosis Main  Normal Saline Lot Number [id number]  Dialyzer Lot Number [id number]  Disposable Set Lot Number [id number]  Machine Temperature 98.6 F (37 C)  Musician and Audible Yes  Blood Lines Intact and Secured Yes  Pre Treatment Patient Checks  Vascular access used during treatment Catheter  Patient is receiving dialysis in a chair Yes  Hepatitis B Surface Antigen Results Negative  Date Hepatitis B Surface Antigen Drawn 06/16/17  Hepatitis B Surface Antibody  (>10)  Date Hepatitis B Surface Antibody Drawn 06/16/17  Hemodialysis Consent Verified Yes  Hemodialysis Standing Orders Initiated Yes  ECG (Telemetry) Monitor On Yes  Prime Ordered Normal Saline  Length of  DialysisTreatment -hour(s) 3.5 Hour(s)  Dialysis Treatment Comments Na 140  Dialyzer Elisio 17H NR  Dialysate 2K, 2.5 Ca  Dialysis Anticoagulant None  Dialysate Flow Ordered 800  Blood Flow Rate Ordered 400 mL/min  Ultrafiltration Goal 3.5 Liters  Dialysis Blood Pressure Support Ordered Normal Saline
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
## 2018-04-09 NOTE — ED Notes (Signed)
Dialysis is ready for pt - will have Stanley [name] ED Medic to transport

## 2018-04-09 NOTE — Discharge Instructions (Addendum)
Please return as needed.  Resume your dialysis.  You may need to switch to Monday Wednesday and Friday now.  Talk to your dialysis center t to see if you need to arrange that.

## 2018-04-09 NOTE — Progress Notes (Signed)
Tx completed , pt stable   04/09/18 1800  Vital Signs  Pulse Rate 91  Pulse Rate Source Monitor  Resp 18  BP 140/89  BP Location Left Arm  BP Method Automatic  Patient Position (if appropriate) Sitting  Oxygen Therapy  SpO2 95 %  O2 Device Room Air  During Hemodialysis Assessment  HD Safety Checks Performed Yes  KECN 70.2 KECN  Dialysis Fluid Bolus Normal Saline  Bolus Amount (mL) 250 mL  Intra-Hemodialysis Comments Tx completed;Tolerated well

## 2018-04-09 NOTE — Progress Notes (Signed)
HD Tx started    04/09/18 1427  Vital Signs  Pulse Rate 87  Pulse Rate Source Monitor  Resp 18  BP (!) 153/102  BP Location Left Arm  BP Method Automatic  Patient Position (if appropriate) Sitting  Oxygen Therapy  SpO2 97 %  During Hemodialysis Assessment  Blood Flow Rate (mL/min) 400 mL/min  Arterial Pressure (mmHg) -200 mmHg  Venous Pressure (mmHg) 160 mmHg  Transmembrane Pressure (mmHg) 70 mmHg  Ultrafiltration Rate (mL/min) 1340 mL/min  Dialysate Flow Rate (mL/min) 800 ml/min  Conductivity: Machine  13.5  HD Safety Checks Performed Yes  Dialysis Fluid Bolus Normal Saline  Bolus Amount (mL) 250 mL  Intra-Hemodialysis Comments Tx initiated (Pt awake, stable )  Hemodialysis Catheter Right Internal jugular Double-lumen  No Placement Date or Time found.   Placed prior to admission: Yes  Orientation: Right  Access Location: Internal jugular  Hemodialysis Catheter Type: Double-lumen  Site Condition No complications  Blue Lumen Status Infusing  Red Lumen Status Infusing  Purple Lumen Status N/A  Dressing Status Clean;Dry;Intact  Interventions New dressing;Dressing changed  Drainage Description None  Dressing Change Due 04/17/18

## 2018-04-09 NOTE — Progress Notes (Signed)
Pre HD Assessment    04/09/18 1420  Neurological  Level of Consciousness Alert  Orientation Level Oriented X4  Respiratory  Respiratory Pattern Regular;Labored  Chest Assessment Chest expansion symmetrical  Bilateral Breath Sounds Diminished  Cough None  Cardiac  Pulse Regular  Heart Sounds S1, S2  ECG Monitor Yes  Vascular  R Radial Pulse +2  L Radial Pulse +2  Edema Generalized  Generalized Edema +2  Psychosocial  Psychosocial (WDL) WDL

## 2018-04-09 NOTE — ED Notes (Signed)
Dr Cinda Quest notified of K+ 6.6 - he will be setting pt up for dialysis
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
## 2018-04-09 NOTE — ED Provider Notes (Signed)
Patient returns from dialysis.  He feels well.  We will let him go.  I will have him resume his regular dialysis schedule or adjusted to Monday Wednesday and Friday if need be.   Nena Polio, MD 04/09/18 [phone]

## 2018-04-11 ENCOUNTER — Ambulatory Visit: Admission: RE | Admit: 2018-04-11 | Payer: Medicare Other | Source: Ambulatory Visit

## 2018-04-12 ENCOUNTER — Encounter: Payer: Self-pay | Admitting: Intensive Care

## 2018-04-12 ENCOUNTER — Emergency Department
Admission: EM | Admit: 2018-04-12 | Discharge: 2018-04-12 | Disposition: A | Discharge status: Home / Self Care | Payer: Medicare Other | Source: Home / Self Care | Attending: Emergency Medicine | Admitting: Emergency Medicine

## 2018-04-12 ENCOUNTER — Other Ambulatory Visit: Payer: Self-pay

## 2018-04-12 DIAGNOSIS — F1410 Cocaine abuse, uncomplicated: Secondary | ICD-10-CM | POA: Insufficient documentation

## 2018-04-12 DIAGNOSIS — Z79899 Other long term (current) drug therapy: Secondary | ICD-10-CM

## 2018-04-12 DIAGNOSIS — F17210 Nicotine dependence, cigarettes, uncomplicated: Secondary | ICD-10-CM

## 2018-04-12 DIAGNOSIS — R188 Other ascites: Secondary | ICD-10-CM | POA: Diagnosis not present

## 2018-04-12 DIAGNOSIS — Z992 Dependence on renal dialysis: Secondary | ICD-10-CM | POA: Diagnosis not present

## 2018-04-12 DIAGNOSIS — Z951 Presence of aortocoronary bypass graft: Secondary | ICD-10-CM

## 2018-04-12 DIAGNOSIS — I132 Hypertensive heart and chronic kidney disease with heart failure and with stage 5 chronic kidney disease, or end stage renal disease: Secondary | ICD-10-CM | POA: Insufficient documentation

## 2018-04-12 DIAGNOSIS — J449 Chronic obstructive pulmonary disease, unspecified: Secondary | ICD-10-CM | POA: Insufficient documentation

## 2018-04-12 DIAGNOSIS — R9431 Abnormal electrocardiogram [ECG] [EKG]: Secondary | ICD-10-CM | POA: Diagnosis not present

## 2018-04-12 DIAGNOSIS — E875 Hyperkalemia: Secondary | ICD-10-CM | POA: Diagnosis not present

## 2018-04-12 DIAGNOSIS — I2510 Atherosclerotic heart disease of native coronary artery without angina pectoris: Secondary | ICD-10-CM

## 2018-04-12 DIAGNOSIS — Z5320 Procedure and treatment not carried out because of patient's decision for unspecified reasons: Secondary | ICD-10-CM | POA: Insufficient documentation

## 2018-04-12 DIAGNOSIS — N186 End stage renal disease: Secondary | ICD-10-CM | POA: Insufficient documentation

## 2018-04-12 DIAGNOSIS — I509 Heart failure, unspecified: Secondary | ICD-10-CM | POA: Insufficient documentation

## 2018-04-12 DIAGNOSIS — I120 Hypertensive chronic kidney disease with stage 5 chronic kidney disease or end stage renal disease: Secondary | ICD-10-CM | POA: Diagnosis not present

## 2018-04-12 DIAGNOSIS — I70222 Atherosclerosis of native arteries of extremities with rest pain, left leg: Secondary | ICD-10-CM | POA: Diagnosis not present

## 2018-04-12 DIAGNOSIS — F1210 Cannabis abuse, uncomplicated: Secondary | ICD-10-CM | POA: Insufficient documentation

## 2018-04-12 DIAGNOSIS — I739 Peripheral vascular disease, unspecified: Secondary | ICD-10-CM | POA: Diagnosis not present

## 2018-04-12 DIAGNOSIS — E8770 Fluid overload, unspecified: Secondary | ICD-10-CM | POA: Diagnosis not present

## 2018-04-12 MED ORDER — MORPHINE SULFATE (PF) 2 MG/ML IV SOLN
2.0000 mg | Freq: Once | INTRAVENOUS | Status: AC
  Administered 2018-04-12: 2 mg via INTRAVENOUS
  Filled 2018-04-12: qty 1

## 2018-04-12 MED ORDER — CHLORHEXIDINE GLUCONATE CLOTH 2 % EX PADS: 6.0000 | MEDICATED_PAD | Freq: Every day | CUTANEOUS | Status: DC

## 2018-04-12 NOTE — ED Notes (Signed)
Pt given warm blanket.
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
## 2018-04-12 NOTE — ED Notes (Signed)
Pt left AMA without signing [place] paperwork.

## 2018-04-12 NOTE — Progress Notes (Signed)
Post dialysis assessment   04/12/18 1757  Neurological  Level of Consciousness Alert  Orientation Level Oriented X4  Respiratory  Respiratory Pattern Regular;Unlabored  Chest Assessment Chest expansion symmetrical  Bilateral Breath Sounds Clear;Diminished  Cough Non-productive  Cardiac  Pulse Regular  Heart Sounds S1, S2  Vascular  R Radial Pulse +2  L Radial Pulse +2  R Dorsalis Pedis Pulse +1  L Dorsalis Pedis Pulse +1  Psychosocial  Psychosocial (WDL) WDL

## 2018-04-12 NOTE — Progress Notes (Signed)
Pt tolerated tx well, 3012 mls removed, tx ended without problems.   04/12/18 1754  Vital Signs  Pulse Rate 79  Pulse Rate Source Monitor  BP 120/70  BP Location Left Arm  BP Method Automatic  Patient Position (if appropriate) Lying  During Hemodialysis Assessment  Blood Flow Rate (mL/min) 400 mL/min  Arterial Pressure (mmHg) -150 mmHg  Venous Pressure (mmHg) 150 mmHg  Transmembrane Pressure (mmHg) 80 mmHg  Ultrafiltration Rate (mL/min) 70 mL/min  Dialysate Flow Rate (mL/min) 800 ml/min  Conductivity: Machine  13.8  HD Safety Checks Performed Yes  Dialysis Fluid Bolus Normal Saline  Bolus Amount (mL) 250 mL  Intra-Hemodialysis Comments Tx completed;Tolerated well

## 2018-04-12 NOTE — Progress Notes (Signed)
Pt resting comfortably.   04/12/18 1530  Vital Signs  Pulse Rate 84  Pulse Rate Source Monitor  During Hemodialysis Assessment  Blood Flow Rate (mL/min) 400 mL/min  Arterial Pressure (mmHg) -150 mmHg  Venous Pressure (mmHg) 140 mmHg  Transmembrane Pressure (mmHg) 80 mmHg  Ultrafiltration Rate (mL/min) 860 mL/min  Dialysate Flow Rate (mL/min) 800 ml/min  Conductivity: Machine  13.9  HD Safety Checks Performed Yes  Intra-Hemodialysis Comments Progressing as prescribed

## 2018-04-12 NOTE — ED Notes (Signed)
EDP [place] verbal to check pt's foot pulses by doppler d/t leg foot pain pt c/o while in dialysis. Pt back from dialysis now refusing doppler of pulses and wants to leave AMA.

## 2018-04-12 NOTE — Progress Notes (Signed)
Pt stable resting comfortably   04/12/18 1700  Vital Signs  Pulse Rate 85  Pulse Rate Source Monitor  BP 118/79  BP Location Left Arm  BP Method Automatic  Patient Position (if appropriate) Lying  During Hemodialysis Assessment  Blood Flow Rate (mL/min) 400 mL/min  Arterial Pressure (mmHg) -150 mmHg  Venous Pressure (mmHg) 160 mmHg  Transmembrane Pressure (mmHg) 80 mmHg  Ultrafiltration Rate (mL/min) 860 mL/min  Dialysate Flow Rate (mL/min) 800 ml/min  Conductivity: Machine  13.8  HD Safety Checks Performed Yes  Intra-Hemodialysis Comments Progressing as prescribed (2350 mls)

## 2018-04-12 NOTE — Progress Notes (Signed)
Pt c/o severe pain left big toe, MD notified and examined patient, hemodynamically stable, tx started without any problems.   04/12/18 1414  Vital Signs  Temp 98.2 F (36.8 C)  Temp Source Oral  Pulse Rate 77  Pulse Rate Source Monitor  Resp 18  BP 122/62  BP Location Left Arm  BP Method Automatic  Patient Position (if appropriate) Sitting  During Hemodialysis Assessment  Blood Flow Rate (mL/min) 400 mL/min  Arterial Pressure (mmHg) -120 mmHg  Venous Pressure (mmHg) 140 mmHg  Transmembrane Pressure (mmHg) 60 mmHg  Ultrafiltration Rate (mL/min) 860 mL/min  Dialysate Flow Rate (mL/min) 800 ml/min  Conductivity: Machine  13.8  HD Safety Checks Performed Yes  Dialysis Fluid Bolus Normal Saline  Bolus Amount (mL) 250 mL  Dialysate Change  (Na 140)  Intra-Hemodialysis Comments Tx initiated

## 2018-04-12 NOTE — Progress Notes (Signed)
Pt watching TV .   04/12/18 1445  Vital Signs  Pulse Rate 80  Pulse Rate Source Monitor  BP 112/66  BP Location Left Arm  BP Method Automatic  Patient Position (if appropriate) Sitting  During Hemodialysis Assessment  Blood Flow Rate (mL/min) 400 mL/min  Arterial Pressure (mmHg) -120 mmHg  Venous Pressure (mmHg) 130 mmHg  Transmembrane Pressure (mmHg) 70 mmHg  Ultrafiltration Rate (mL/min) 860 mL/min  Dialysate Flow Rate (mL/min) 800 ml/min  Conductivity: Machine  13.9  HD Safety Checks Performed Yes  Intra-Hemodialysis Comments Progressing as prescribed (600 mls removed )
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
## 2018-04-12 NOTE — Progress Notes (Signed)
Pt sleeping.   04/12/18 1730  Vital Signs  Pulse Rate 84  Pulse Rate Source Monitor  BP 107/69  BP Location Left Arm  BP Method Automatic  Patient Position (if appropriate) Lying  During Hemodialysis Assessment  Blood Flow Rate (mL/min) 400 mL/min  Arterial Pressure (mmHg) -150 mmHg  Venous Pressure (mmHg) 150 mmHg  Transmembrane Pressure (mmHg) 80 mmHg  Ultrafiltration Rate (mL/min) 860 mL/min  Dialysate Flow Rate (mL/min) 800 ml/min  Conductivity: Machine  13.7  HD Safety Checks Performed Yes  Intra-Hemodialysis Comments Progressing as prescribed (2802 mls)

## 2018-04-12 NOTE — Progress Notes (Signed)
Pt resting comfortably   04/12/18 1645  Vital Signs  Pulse Rate 83  Pulse Rate Source Monitor  BP 116/76  BP Location Left Arm  BP Method Automatic  Patient Position (if appropriate) Lying  During Hemodialysis Assessment  Blood Flow Rate (mL/min) 400 mL/min  Arterial Pressure (mmHg) -150 mmHg  Venous Pressure (mmHg) 160 mmHg  Transmembrane Pressure (mmHg) 80 mmHg  Ultrafiltration Rate (mL/min) 860 mL/min  Dialysate Flow Rate (mL/min) 800 ml/min  Conductivity: Machine  13.9  HD Safety Checks Performed Yes  Intra-Hemodialysis Comments Progressing as prescribed (2119 mls)

## 2018-04-12 NOTE — Progress Notes (Signed)
Pt watching TV   04/12/18 1615  Vital Signs  Pulse Rate 81  Pulse Rate Source Monitor  BP 107/62  BP Location Left Arm  BP Method Automatic  Patient Position (if appropriate) Lying  During Hemodialysis Assessment  Blood Flow Rate (mL/min) 400 mL/min  Arterial Pressure (mmHg) -150 mmHg  Venous Pressure (mmHg) 140 mmHg  Transmembrane Pressure (mmHg) 80 mmHg  Ultrafiltration Rate (mL/min) 860 mL/min  Dialysate Flow Rate (mL/min) 800 ml/min  Conductivity: Machine  13.8  HD Safety Checks Performed Yes  Intra-Hemodialysis Comments Progressing as prescribed

## 2018-04-12 NOTE — ED Provider Notes (Signed)
[facility] Emergency Department Provider Note   ____________________________________________   First MD Initiated Contact with Patient 04/12/18 1309     (approximate)  I have reviewed the triage vital signs and the nursing notes.   HISTORY  Chief Complaint Dialysis    HPI Stanley [name] is a 58 y.o. male here for dialysis   Patient reports he last had dialysis 2 days ago.  He is been feeling well since.  Reports he is due for dialysis today.  Reports Dr. [name] probably knows he is here already.  No shortness of breath or chest pain.  No leg swelling.  Patient is normal health.  Reports he has no concerns just wants his dialysis done.  He denies any pain.  No nausea or vomiting.  No chest pain.  No pains in his arms or legs.  Past Medical History:  Diagnosis Date  . Alcohol abuse   . CHF (congestive heart failure) ([place])   . Cirrhosis ([place])   . Coronary artery disease 2009  . Drug abuse ([place])   . End stage renal disease on dialysis [facility]) NEPHROLOGIST-   DR [facility]  IN [address]   HEMODIALYSIS --   TUES/  THURS/  SAT  . Gastrointestinal bleed 06/13/2017   From chart...hx of multiple GI bleeds  . GERD (gastroesophageal reflux disease)   . Hyperlipidemia   . Hypertension   . PAD (peripheral artery disease) ([place])   . Renal insufficiency    Per pt, 32 oz fluid restriction per day  . S/P triple vessel bypass 06/09/2016   2009ish  . Suicidal ideation    & HOMICIDAL IDEATION --  06-16-2013   ADMITTED TO BEHAVIOR HEALTH    Patient Active Problem List   Diagnosis Date Noted  . Healthcare-associated pneumonia 01/15/2018  . Acute on chronic respiratory failure ([place]) 01/15/2018  . Acute pulmonary edema ([place]) 12/23/2017  . Acute respiratory failure ([place]) 10/29/2017  . ESRD (end stage renal disease) on dialysis ([place]) 07/28/2017  . Protein-calorie malnutrition, severe 06/14/2017  . Encounter for dialysis [facility])   . Palliative care by specialist   .  Goals of care, counseling/discussion   . Malnutrition of moderate degree 06/05/2017  . Secondary esophageal varices without bleeding ([place])   . Stomach irritation   . Idiopathic esophageal varices without bleeding ([place])   . Alcoholic hepatitis with ascites 05/24/2017  . ESRD (end stage renal disease) ([place]) 04/28/2017  . Uremia 03/08/2017  . ESRD on hemodialysis ([place]) 03/03/2017  . Weakness 02/28/2017  . Hypocalcemia 02/22/2017  . Shortness of breath 11/26/2016  . COPD (chronic obstructive pulmonary disease) ([place]) 10/30/2016  . COPD exacerbation ([place]) 10/29/2016  . Anemia   . Heme positive stool   . Ulceration of intestine   . Benign neoplasm of transverse colon   . Acute gastrointestinal hemorrhage   . Esophageal candidiasis ([place])   . Angiodysplasia of intestinal tract   . Acute respiratory failure with hypoxia ([place]) 07/03/2016  . GI bleeding 06/24/2016  . Rectal bleeding 06/14/2016  . Anemia of chronic disease 06/01/2016  . MRSA carrier 06/01/2016  . Chronic renal failure 05/23/2016  . Ischemic heart disease 05/23/2016  . Angiodysplasia of small intestine   . Melena   . Small bowel bleed not requiring more than 4 units of blood in 24 hours, ICU, or surgery   . Anemia due to chronic blood loss   . Abdominal pain 05/05/2016  . Acute posthemorrhagic anemia 04/17/2016  . Gastrointestinal bleed 04/17/2016  .  History of esophagogastroduodenoscopy (EGD) 04/17/2016  . Elevated troponin 04/17/2016  . Alcohol abuse 04/17/2016  . Upper GI bleed 01/19/2016  . Blood in stool   . Angiodysplasia of stomach and duodenum with hemorrhage   . Gastritis   . Reflux esophagitis   . GI bleed 05/16/2015  . Acute GI bleeding   . Symptomatic anemia 04/30/2015  . HTN (hypertension) 04/06/2015  . GERD (gastroesophageal reflux disease) 04/06/2015  . HLD (hyperlipidemia) 04/06/2015  . Dyspnea 04/06/2015  . Cirrhosis of liver with ascites ([place]) 04/06/2015  . Ascites 04/06/2015  . GIB  (gastrointestinal bleeding) 03/23/2015  . Homicidal ideation 06/19/2013  . Suicidal intent 06/19/2013  . Homicidal ideations 06/19/2013  . Hyperkalemia 06/16/2013  . Mandible fracture ([place]) 06/05/2013  . Fracture, mandible ([place]) 06/02/2013  . Coronary atherosclerosis of native coronary artery 06/02/2013  . ESRD on dialysis ([place]) 06/02/2013  . Mandible open fracture ([place]) 06/02/2013    Past Surgical History:  Procedure Laterality Date  . A/V FISTULAGRAM Right 06/06/2017   Procedure: A/V FISTULAGRAM;  Surgeon: Katha Cabal, MD;  Location: [place] CV LAB;  Service: Cardiovascular;  Laterality: Right;  . A/V SHUNT INTERVENTION N/A 06/06/2017   Procedure: A/V SHUNT INTERVENTION;  Surgeon: Katha Cabal, MD;  Location: [place] CV LAB;  Service: Cardiovascular;  Laterality: N/A;  . AGILE CAPSULE N/A 06/19/2016   Procedure: AGILE CAPSULE;  Surgeon: [name], MD;  Location: [facility] ENDOSCOPY;  Service: Endoscopy;  Laterality: N/A;  . COLONOSCOPY WITH PROPOFOL N/A 06/18/2016   Procedure: COLONOSCOPY WITH PROPOFOL;  Surgeon: [name], MD;  Location: [facility] ENDOSCOPY;  Service: Endoscopy;  Laterality: N/A;  . COLONOSCOPY WITH PROPOFOL N/A 08/12/2016   Procedure: COLONOSCOPY WITH PROPOFOL;  Surgeon: [name], MD;  Location: [facility] ENDOSCOPY;  Service: Endoscopy;  Laterality: N/A;  . COLONOSCOPY WITH PROPOFOL N/A 05/05/2017   Procedure: COLONOSCOPY WITH PROPOFOL;  Surgeon: Manya Silvas, MD;  Location: [facility] ENDOSCOPY;  Service: Endoscopy;  Laterality: N/A;  . CORONARY ANGIOPLASTY  ?   PT UNABLE TO TELL IF  BEFORE OR AFTER  CABG  . CORONARY ARTERY BYPASS GRAFT  2008  ([place])   3 VESSEL  . DIALYSIS FISTULA CREATION  LAST SURGERY  APPOX  2008  . ENTEROSCOPY N/A 05/10/2016   Procedure: ENTEROSCOPY;  Surgeon: Jerene Bears, MD;  Location: [place];  Service: Gastroenterology;  Laterality: N/A;  . ENTEROSCOPY N/A 08/12/2016   Procedure: ENTEROSCOPY;  Surgeon: [name],  MD;  Location: [facility] ENDOSCOPY;  Service: Endoscopy;  Laterality: N/A;  . ENTEROSCOPY Left 06/03/2017   Procedure: ENTEROSCOPY;  Surgeon: Virgel Manifold, MD;  Location: [facility] ENDOSCOPY;  Service: Endoscopy;  Laterality: Left;  Procedure date will ultimately depend on when patient is medically optimized before the procedure, pending hemodialysis and blood transfusions etc. Will place on schedule and change depending on clinical status.   . ENTEROSCOPY N/A 06/05/2017   Procedure: ENTEROSCOPY;  Surgeon: Virgel Manifold, MD;  Location: [facility] ENDOSCOPY;  Service: Endoscopy;  Laterality: N/A;  . ENTEROSCOPY N/A 06/15/2017   Procedure: Push ENTEROSCOPY;  Surgeon: [name], MD;  Location: [facility] ENDOSCOPY;  Service: Endoscopy;  Laterality: N/A;  . ESOPHAGOGASTRODUODENOSCOPY N/A 05/07/2015   Procedure: ESOPHAGOGASTRODUODENOSCOPY (EGD);  Surgeon: Hulen Luster, MD;  Location: [facility] ENDOSCOPY;  Service: Endoscopy;  Laterality: N/A;  . ESOPHAGOGASTRODUODENOSCOPY (EGD) WITH PROPOFOL N/A 05/17/2015   Procedure: ESOPHAGOGASTRODUODENOSCOPY (EGD) WITH PROPOFOL;  Surgeon: [name], MD;  Location: [facility] ENDOSCOPY;  Service: Endoscopy;  Laterality: N/A;  .  ESOPHAGOGASTRODUODENOSCOPY (EGD) WITH PROPOFOL N/A 01/20/2016   Procedure: ESOPHAGOGASTRODUODENOSCOPY (EGD) WITH PROPOFOL;  Surgeon: [name], MD;  Location: [facility] ENDOSCOPY;  Service: Endoscopy;  Laterality: N/A;  . ESOPHAGOGASTRODUODENOSCOPY (EGD) WITH PROPOFOL N/A 04/17/2016   Procedure: ESOPHAGOGASTRODUODENOSCOPY (EGD) WITH PROPOFOL;  Surgeon: Lin Landsman, MD;  Location: [facility] ENDOSCOPY;  Service: Gastroenterology;  Laterality: N/A;  . ESOPHAGOGASTRODUODENOSCOPY (EGD) WITH PROPOFOL  05/09/2016   Procedure: ESOPHAGOGASTRODUODENOSCOPY (EGD) WITH PROPOFOL;  Surgeon: Jerene Bears, MD;  Location: [place];  Service: Endoscopy;;  . ESOPHAGOGASTRODUODENOSCOPY (EGD) WITH PROPOFOL N/A 06/16/2016   Procedure: ESOPHAGOGASTRODUODENOSCOPY (EGD) WITH PROPOFOL;  Surgeon:  [name], MD;  Location: [facility] ENDOSCOPY;  Service: Endoscopy;  Laterality: N/A;  . ESOPHAGOGASTRODUODENOSCOPY (EGD) WITH PROPOFOL N/A 05/05/2017   Procedure: ESOPHAGOGASTRODUODENOSCOPY (EGD) WITH PROPOFOL;  Surgeon: Manya Silvas, MD;  Location: [facility] ENDOSCOPY;  Service: Endoscopy;  Laterality: N/A;  . ESOPHAGOGASTRODUODENOSCOPY (EGD) WITH PROPOFOL N/A 06/15/2017   Procedure: ESOPHAGOGASTRODUODENOSCOPY (EGD) WITH PROPOFOL;  Surgeon: [name], MD;  Location: [facility] ENDOSCOPY;  Service: Endoscopy;  Laterality: N/A;  . GIVENS CAPSULE STUDY N/A 05/07/2016   Procedure: GIVENS CAPSULE STUDY;  Surgeon: Doran Stabler, MD;  Location: [place];  Service: Endoscopy;  Laterality: N/A;  . MANDIBULAR HARDWARE REMOVAL N/A 07/29/2013   Procedure: REMOVAL OF ARCH BARS;  Surgeon: Theodoro Kos, DO;  Location: [place];  Service: Plastics;  Laterality: N/A;  . ORIF MANDIBULAR FRACTURE N/A 06/05/2013   Procedure: REPAIR OF MANDIBULAR FRACTURE x 2 with maxillo-mandibular fixation ;  Surgeon: Theodoro Kos, DO;  Location: [place];  Service: Plastics;  Laterality: N/A;  . PARACENTESIS    . PERIPHERAL ARTERIAL STENT GRAFT Left     Prior to Admission medications   Medication Sig Start Date End Date Taking? Authorizing Provider  budesonide-formoterol (SYMBICORT) 160-4.5 MCG/ACT inhaler Inhale 2 puffs into the lungs daily. 11/16/16  Yes Vaughan Basta, MD  calcium acetate (PHOSLO) 667 MG capsule Take 2,001 mg by mouth 3 (three) times daily.   Yes [provider]  folic acid (FOLVITE) 1 MG tablet Take 1 tablet (1 mg total) by mouth daily. 10/31/17  Yes Fritzi Mandes, MD  gabapentin (NEURONTIN) 300 MG capsule Take 300 mg by mouth 3 (three) times daily.  08/16/16  Yes [provider]  ipratropium-albuterol (DUONEB) 0.5-2.5 (3) MG/3ML SOLN Take 3 mLs by nebulization every 6 (six) hours as needed. 11/28/16  Yes Wiet[place], Richard, MD  labetalol (NORMODYNE) 100 MG tablet Take 100 mg  by mouth daily.   Yes [provider]  multivitamin (RENA-VIT) TABS tablet Take 1 tablet by mouth at bedtime. 10/31/17  Yes Fritzi Mandes, MD  omeprazole (PRILOSEC) 40 MG capsule Take 40 mg by mouth daily. 09/08/17  Yes [provider]  tiotropium (SPIRIVA HANDIHALER) 18 MCG inhalation capsule Place 1 capsule (18 mcg total) into inhaler and inhale daily. 01/30/18 04/30/18 Yes Demetrios Loll, MD  acetaminophen (TYLENOL) 325 MG tablet Take 2 tablets (650 mg total) by mouth every 6 (six) hours as needed for mild pain (or Fever >/= 101). Patient not taking: Reported on 04/09/2018 03/27/18   Nicholes Mango, MD    Allergies Patient has no known allergies.  Family History  Problem Relation Age of Onset  . Colon cancer Mother   . Cancer Father   . Cancer Sister   . Kidney disease Brother     Social History Social History   Tobacco Use  . Smoking status: Current Every Day Smoker    Packs/day: 0.15  Years: 40.00    Pack years: 6.00    Types: Cigarettes  . Smokeless tobacco: Never Used  Substance Use Topics  . Alcohol use: No    Comment: pt reports quitting after learning about cirrhosis  . Drug use: No    Frequency: 7.0 times per week    Types: Marijuana, Cocaine    Review of Systems Constitutional: No fever/chills Eyes: No visual changes. ENT: No sore throat. Cardiovascular: Denies chest pain. Respiratory: Denies shortness of breath. Gastrointestinal: No abdominal pain.   Skin: Negative for rash. Neurological: Negative for headaches, areas of focal weakness or numbness.    ____________________________________________   PHYSICAL EXAM:  VITAL SIGNS: ED Triage Vitals  Enc Vitals Group     BP 04/12/18 1259 104/69     Pulse Rate 04/12/18 1259 77     Resp 04/12/18 1259 18     Temp 04/12/18 1259 97.8 F (36.6 C)     Temp Source 04/12/18 1259 Oral     SpO2 04/12/18 1259 94 %     Weight 04/12/18 1300 151 lb 14.4 oz (68.9 kg)     Height 04/12/18 1300 6\' 3"  (1.905  m)     Head Circumference --      Peak Flow --      Pain Score 04/12/18 1300 0     Pain Loc --      Pain Edu? --      Excl. in [place]? --     Constitutional: Alert and oriented. Well appearing and in no acute distress. Eyes: Conjunctivae are normal. Head: Atraumatic. Nose: No congestion/rhinnorhea. Mouth/Throat: Mucous membranes are moist. Neck: No stridor.  Cardiovascular: Normal rate, regular rhythm. Grossly normal heart sounds.  Good peripheral circulation. Respiratory: Normal respiratory effort.  No retractions. Lungs CTAB. Gastrointestinal: Soft and nontender. No distention. Musculoskeletal: No lower extremity tenderness nor edema. Neurologic:  Normal speech and language.  Skin:  Skin is warm, dry and intact. No rash noted. Psychiatric: Mood and affect are calm, amicable.  ____________________________________________   LABS (all labs ordered are listed, but only abnormal results are displayed)  Labs Reviewed - No data to display ____________________________________________  EKG   ____________________________________________  RADIOLOGY   ____________________________________________   PROCEDURES  Procedure(s) performed: None  Procedures  Critical Care performed: No  ____________________________________________   INITIAL IMPRESSION / ASSESSMENT AND PLAN / ED COURSE  Pertinent labs & imaging results that were available during my care of the patient were reviewed by me and considered in my medical decision making (see chart for details).   ----------------------------------------- 1:22 PM on 04/12/2018 -----------------------------------------  Consult placed with Dr. [name], reports that he is entering orders for hemodialysis today.  Patient does not appear to have any evidence of volume overload or acute complication.  He is resting comfortably at this time.  Have requested hemodialysis, denies any associated symptoms.  Anticipate if his hemodialysis goes  well, patient to be discharged to follow-up again for his next session.     Received a call from Dr. [name] reporting that there was concern the patient may have ischemia in the left lower extremity during his dialysis run.  The patient did not report any symptoms to me of concern, but we will plan to evaluate after his return from dialysis.  Discussed with nurse [place], also Dr. Burlene Arnt to follow-up on reassessment after hemodialysis to evaluate vascular status involving lower legs.    ----------------------------------------- 9:07 PM on 04/12/2018 -----------------------------------------  As I am clinically my chart, I have realized  that the patient did leave AMA without signing paperwork.  He refused further assessment of his lower extremities, and does appear that postdialysis he is documented to have palpable dorsalis pedis pulses bilaterally by dialysis RN.  Patient was not agreeable to stay for further evaluation according to nurse. ____________________________________________   FINAL CLINICAL IMPRESSION(S) / ED DIAGNOSES  Final diagnoses:  End stage renal disease ([place])        Note:  This document was prepared using Dragon voice recognition software and may include unintentional dictation errors       Delman Kitten, MD 04/12/18 2108

## 2018-04-12 NOTE — ED Notes (Signed)
Dialysis staff given report.

## 2018-04-12 NOTE — Progress Notes (Signed)
Pre dialysis assessment   04/12/18 1405  Neurological  Level of Consciousness Alert  Orientation Level Oriented X4  Respiratory  Respiratory Pattern Regular;Unlabored  Chest Assessment Chest expansion symmetrical  Bilateral Breath Sounds Expiratory wheezes  Cough Non-productive  Cardiac  Pulse Regular  Heart Sounds S1, S2  ECG Monitor Yes  Vascular  R Radial Pulse +2  L Radial Pulse +2  R Dorsalis Pedis Pulse +1  L Dorsalis Pedis Pulse +1  Edema Facial  Facial +1  Musculoskeletal  Musculoskeletal (WDL) X  Generalized Weakness Yes  Gastrointestinal  Bowel Sounds Assessment Active  GU Assessment  Genitourinary (WDL) WDL  Psychosocial  Psychosocial (WDL) WDL

## 2018-04-13 DIAGNOSIS — Z992 Dependence on renal dialysis: Secondary | ICD-10-CM | POA: Diagnosis not present

## 2018-04-13 DIAGNOSIS — N186 End stage renal disease: Secondary | ICD-10-CM | POA: Diagnosis not present

## 2018-04-14 ENCOUNTER — Encounter: Payer: Self-pay | Admitting: Emergency Medicine

## 2018-04-14 ENCOUNTER — Other Ambulatory Visit: Payer: Self-pay

## 2018-04-14 ENCOUNTER — Inpatient Hospital Stay
Admission: EM | Admit: 2018-04-14 | Discharge: 2018-04-20 | DRG: 252 | Disposition: A | Discharge status: Home / Self Care | Payer: Medicare Other | Attending: Internal Medicine | Admitting: Internal Medicine

## 2018-04-14 DIAGNOSIS — M25572 Pain in left ankle and joints of left foot: Secondary | ICD-10-CM | POA: Diagnosis present

## 2018-04-14 DIAGNOSIS — Q8781 Alport syndrome: Secondary | ICD-10-CM | POA: Diagnosis not present

## 2018-04-14 DIAGNOSIS — Z841 Family history of disorders of kidney and ureter: Secondary | ICD-10-CM | POA: Diagnosis not present

## 2018-04-14 DIAGNOSIS — F17210 Nicotine dependence, cigarettes, uncomplicated: Secondary | ICD-10-CM | POA: Diagnosis present

## 2018-04-14 DIAGNOSIS — I132 Hypertensive heart and chronic kidney disease with heart failure and with stage 5 chronic kidney disease, or end stage renal disease: Secondary | ICD-10-CM | POA: Diagnosis not present

## 2018-04-14 DIAGNOSIS — E785 Hyperlipidemia, unspecified: Secondary | ICD-10-CM | POA: Diagnosis present

## 2018-04-14 DIAGNOSIS — M79672 Pain in left foot: Secondary | ICD-10-CM | POA: Diagnosis present

## 2018-04-14 DIAGNOSIS — R0989 Other specified symptoms and signs involving the circulatory and respiratory systems: Secondary | ICD-10-CM | POA: Diagnosis present

## 2018-04-14 DIAGNOSIS — Z7951 Long term (current) use of inhaled steroids: Secondary | ICD-10-CM

## 2018-04-14 DIAGNOSIS — E1122 Type 2 diabetes mellitus with diabetic chronic kidney disease: Secondary | ICD-10-CM | POA: Diagnosis not present

## 2018-04-14 DIAGNOSIS — Z800 Family history of malignant neoplasm of digestive organs: Secondary | ICD-10-CM

## 2018-04-14 DIAGNOSIS — J441 Chronic obstructive pulmonary disease with (acute) exacerbation: Secondary | ICD-10-CM | POA: Diagnosis not present

## 2018-04-14 DIAGNOSIS — I70222 Atherosclerosis of native arteries of extremities with rest pain, left leg: Principal | ICD-10-CM | POA: Diagnosis present

## 2018-04-14 DIAGNOSIS — I2510 Atherosclerotic heart disease of native coronary artery without angina pectoris: Secondary | ICD-10-CM | POA: Diagnosis present

## 2018-04-14 DIAGNOSIS — D631 Anemia in chronic kidney disease: Secondary | ICD-10-CM | POA: Diagnosis present

## 2018-04-14 DIAGNOSIS — R188 Other ascites: Secondary | ICD-10-CM | POA: Diagnosis present

## 2018-04-14 DIAGNOSIS — F1010 Alcohol abuse, uncomplicated: Secondary | ICD-10-CM | POA: Diagnosis present

## 2018-04-14 DIAGNOSIS — F1910 Other psychoactive substance abuse, uncomplicated: Secondary | ICD-10-CM | POA: Diagnosis present

## 2018-04-14 DIAGNOSIS — I272 Pulmonary hypertension, unspecified: Secondary | ICD-10-CM | POA: Diagnosis present

## 2018-04-14 DIAGNOSIS — I5032 Chronic diastolic (congestive) heart failure: Secondary | ICD-10-CM | POA: Diagnosis present

## 2018-04-14 DIAGNOSIS — I509 Heart failure, unspecified: Secondary | ICD-10-CM | POA: Diagnosis not present

## 2018-04-14 DIAGNOSIS — I739 Peripheral vascular disease, unspecified: Secondary | ICD-10-CM

## 2018-04-14 DIAGNOSIS — R9431 Abnormal electrocardiogram [ECG] [EKG]: Secondary | ICD-10-CM | POA: Diagnosis not present

## 2018-04-14 DIAGNOSIS — K7460 Unspecified cirrhosis of liver: Secondary | ICD-10-CM | POA: Diagnosis present

## 2018-04-14 DIAGNOSIS — N186 End stage renal disease: Secondary | ICD-10-CM | POA: Diagnosis not present

## 2018-04-14 DIAGNOSIS — R600 Localized edema: Secondary | ICD-10-CM | POA: Diagnosis not present

## 2018-04-14 DIAGNOSIS — Z9119 Patient's noncompliance with other medical treatment and regimen: Secondary | ICD-10-CM | POA: Diagnosis not present

## 2018-04-14 DIAGNOSIS — Z951 Presence of aortocoronary bypass graft: Secondary | ICD-10-CM | POA: Diagnosis not present

## 2018-04-14 DIAGNOSIS — E875 Hyperkalemia: Secondary | ICD-10-CM | POA: Diagnosis present

## 2018-04-14 DIAGNOSIS — J449 Chronic obstructive pulmonary disease, unspecified: Secondary | ICD-10-CM | POA: Diagnosis present

## 2018-04-14 DIAGNOSIS — Z992 Dependence on renal dialysis: Secondary | ICD-10-CM

## 2018-04-14 DIAGNOSIS — E8770 Fluid overload, unspecified: Secondary | ICD-10-CM | POA: Diagnosis not present

## 2018-04-14 DIAGNOSIS — Z79899 Other long term (current) drug therapy: Secondary | ICD-10-CM

## 2018-04-14 DIAGNOSIS — Z9114 Patient's other noncompliance with medication regimen: Secondary | ICD-10-CM

## 2018-04-14 DIAGNOSIS — I998 Other disorder of circulatory system: Secondary | ICD-10-CM | POA: Diagnosis present

## 2018-04-14 DIAGNOSIS — I700 Atherosclerosis of aorta: Secondary | ICD-10-CM | POA: Diagnosis present

## 2018-04-14 DIAGNOSIS — I120 Hypertensive chronic kidney disease with stage 5 chronic kidney disease or end stage renal disease: Secondary | ICD-10-CM | POA: Diagnosis not present

## 2018-04-14 DIAGNOSIS — I10 Essential (primary) hypertension: Secondary | ICD-10-CM | POA: Diagnosis not present

## 2018-04-14 DIAGNOSIS — K219 Gastro-esophageal reflux disease without esophagitis: Secondary | ICD-10-CM | POA: Diagnosis present

## 2018-04-14 LAB — CBC WITH DIFFERENTIAL/PLATELET
Abs Immature Granulocytes: 0.03 10*3/uL (ref 0.00–0.07)
Basophils Absolute: 0.1 10*3/uL (ref 0.0–0.1)
Basophils Relative: 1 %
Eosinophils Absolute: 0.2 10*3/uL (ref 0.0–0.5)
Eosinophils Relative: 3 %
HEMATOCRIT: 45 % (ref 39.0–52.0)
Hemoglobin: 12.8 g/dL — ABNORMAL LOW (ref 13.0–17.0)
Immature Granulocytes: 0 %
Lymphocytes Relative: 16 %
Lymphs Abs: 1.1 10*3/uL (ref 0.7–4.0)
MCH: 25 pg — ABNORMAL LOW (ref 26.0–34.0)
MCHC: 28.4 g/dL — ABNORMAL LOW (ref 30.0–36.0)
MCV: 87.9 fL (ref 80.0–100.0)
Monocytes Absolute: 0.8 10*3/uL (ref 0.1–1.0)
Monocytes Relative: 12 %
Neutro Abs: 4.7 10*3/uL (ref 1.7–7.7)
Neutrophils Relative %: 68 %
Platelets: 302 10*3/uL (ref 150–400)
RBC: 5.12 MIL/uL (ref 4.22–5.81)
RDW: 22.1 % — ABNORMAL HIGH (ref 11.5–15.5)
WBC: 6.9 10*3/uL (ref 4.0–10.5)
nRBC: 0 % (ref 0.0–0.2)

## 2018-04-14 LAB — APTT: aPTT: 35 seconds (ref 24–36)

## 2018-04-14 LAB — RENAL FUNCTION PANEL
Albumin: 3.3 g/dL — ABNORMAL LOW (ref 3.5–5.0)
Anion gap: 12 (ref 5–15)
BUN: 15 mg/dL (ref 6–20)
CALCIUM: 8.9 mg/dL (ref 8.9–10.3)
CO2: 38 mmol/L — ABNORMAL HIGH (ref 22–32)
CREATININE: 5.62 mg/dL — AB (ref 0.61–1.24)
Chloride: 89 mmol/L — ABNORMAL LOW (ref 98–111)
GFR calc non Af Amer: 10 mL/min — ABNORMAL LOW (ref >60)
GFR, EST AFRICAN AMERICAN: 12 mL/min — AB (ref >60)
Glucose, Bld: 81 mg/dL (ref 70–99)
Phosphorus: 4.9 mg/dL — ABNORMAL HIGH (ref 2.5–4.6)
Potassium: 4.6 mmol/L (ref 3.5–5.1)
Sodium: 139 mmol/L (ref 135–145)

## 2018-04-14 LAB — PROTIME-INR
INR: 1.01
Prothrombin Time: 13.2 seconds (ref 11.4–15.2)

## 2018-04-14 LAB — BASIC METABOLIC PANEL
Anion gap: 11 (ref 5–15)
BUN: 15 mg/dL (ref 6–20)
CHLORIDE: 89 mmol/L — AB (ref 98–111)
CO2: 38 mmol/L — ABNORMAL HIGH (ref 22–32)
Calcium: 8.9 mg/dL (ref 8.9–10.3)
Creatinine, Ser: 5.8 mg/dL — ABNORMAL HIGH (ref 0.61–1.24)
GFR calc Af Amer: 11 mL/min — ABNORMAL LOW (ref >60)
GFR calc non Af Amer: 10 mL/min — ABNORMAL LOW (ref >60)
GLUCOSE: 82 mg/dL (ref 70–99)
Potassium: 4.6 mmol/L (ref 3.5–5.1)
Sodium: 138 mmol/L (ref 135–145)

## 2018-04-14 MED ORDER — HYDRALAZINE HCL 20 MG/ML IJ SOLN: 10.0000 mg | Freq: Four times a day (QID) | INTRAMUSCULAR | Status: DC | PRN

## 2018-04-14 MED ORDER — CALCIUM CARBONATE ANTACID 500 MG PO CHEW
1.0000 | CHEWABLE_TABLET | Freq: Two times a day (BID) | ORAL | Status: DC
  Administered 2018-04-14 – 2018-04-19 (×10): 200 mg via ORAL
  Filled 2018-04-14 (×11): qty 1

## 2018-04-14 MED ORDER — ONDANSETRON HCL 4 MG/2ML IJ SOLN
4.0000 mg | Freq: Four times a day (QID) | INTRAMUSCULAR | Status: DC | PRN
  Administered 2018-04-16: 4 mg via INTRAVENOUS
  Filled 2018-04-14: qty 2

## 2018-04-14 MED ORDER — ACETAMINOPHEN 325 MG PO TABS
650.0000 mg | ORAL_TABLET | Freq: Four times a day (QID) | ORAL | Status: DC | PRN
  Administered 2018-04-17: 650 mg via ORAL
  Filled 2018-04-14: qty 2

## 2018-04-14 MED ORDER — HEPARIN (PORCINE) 25000 UT/250ML-% IV SOLN
1700.0000 [IU]/h | INTRAVENOUS | Status: DC
  Administered 2018-04-14: 1150 [IU]/h via INTRAVENOUS
  Administered 2018-04-15: 1550 [IU]/h via INTRAVENOUS
  Administered 2018-04-16 – 2018-04-20 (×8): 1700 [IU]/h via INTRAVENOUS
  Filled 2018-04-14 (×10): qty 250

## 2018-04-14 MED ORDER — OXYCODONE HCL 5 MG PO TABS
5.0000 mg | ORAL_TABLET | ORAL | Status: DC | PRN
  Administered 2018-04-14 – 2018-04-17 (×7): 5 mg via ORAL
  Filled 2018-04-14 (×7): qty 1

## 2018-04-14 MED ORDER — CALCIUM ACETATE (PHOS BINDER) 667 MG PO CAPS
2001.0000 mg | ORAL_CAPSULE | Freq: Three times a day (TID) | ORAL | Status: DC
  Administered 2018-04-14 – 2018-04-15 (×3): 2001 mg via ORAL
  Administered 2018-04-16: 1334 mg via ORAL
  Filled 2018-04-14 (×7): qty 3

## 2018-04-14 MED ORDER — IPRATROPIUM-ALBUTEROL 0.5-2.5 (3) MG/3ML IN SOLN: 3.0000 mL | RESPIRATORY_TRACT | Status: DC | PRN

## 2018-04-14 MED ORDER — MOMETASONE FURO-FORMOTEROL FUM 200-5 MCG/ACT IN AERO
2.0000 | INHALATION_SPRAY | Freq: Two times a day (BID) | RESPIRATORY_TRACT | Status: DC
  Administered 2018-04-14 – 2018-04-20 (×11): 2 via RESPIRATORY_TRACT
  Filled 2018-04-14: qty 8.8

## 2018-04-14 MED ORDER — HEPARIN BOLUS VIA INFUSION
4000.0000 [IU] | Freq: Once | INTRAVENOUS | Status: AC
  Administered 2018-04-14: 4000 [IU] via INTRAVENOUS
  Filled 2018-04-14: qty 4000

## 2018-04-14 MED ORDER — ONDANSETRON HCL 4 MG PO TABS
4.0000 mg | ORAL_TABLET | Freq: Four times a day (QID) | ORAL | Status: DC | PRN
  Administered 2018-04-18: 4 mg via ORAL
  Filled 2018-04-14: qty 1

## 2018-04-14 MED ORDER — TIOTROPIUM BROMIDE MONOHYDRATE 18 MCG IN CAPS
18.0000 ug | ORAL_CAPSULE | Freq: Every day | RESPIRATORY_TRACT | Status: DC
  Administered 2018-04-14 – 2018-04-20 (×7): 18 ug via RESPIRATORY_TRACT
  Filled 2018-04-14 (×2): qty 5

## 2018-04-14 MED ORDER — MORPHINE SULFATE (PF) 2 MG/ML IV SOLN
1.0000 mg | Freq: Four times a day (QID) | INTRAVENOUS | Status: DC | PRN
  Administered 2018-04-14 – 2018-04-17 (×12): 1 mg via INTRAVENOUS
  Filled 2018-04-14 (×12): qty 1

## 2018-04-14 MED ORDER — ACETAMINOPHEN 650 MG RE SUPP: 650.0000 mg | Freq: Four times a day (QID) | RECTAL | Status: DC | PRN

## 2018-04-14 MED ORDER — MORPHINE SULFATE (PF) 2 MG/ML IV SOLN
2.0000 mg | Freq: Once | INTRAVENOUS | Status: AC
  Administered 2018-04-14: 2 mg via INTRAVENOUS
  Filled 2018-04-14: qty 1

## 2018-04-14 MED ORDER — CHLORHEXIDINE GLUCONATE CLOTH 2 % EX PADS
6.0000 | MEDICATED_PAD | Freq: Every day | CUTANEOUS | Status: DC
  Administered 2018-04-14 – 2018-04-20 (×7): 6 via TOPICAL

## 2018-04-14 MED ORDER — RENA-VITE PO TABS
1.0000 | ORAL_TABLET | Freq: Every day | ORAL | Status: DC
  Administered 2018-04-14 – 2018-04-19 (×6): 1 via ORAL
  Filled 2018-04-14 (×6): qty 1

## 2018-04-14 NOTE — Progress Notes (Signed)
HD Tx started    04/14/18 1052  Vital Signs  Pulse Rate 83  Resp (!) 21  BP 119/81  Oxygen Therapy  SpO2 98 %  During Hemodialysis Assessment  Blood Flow Rate (mL/min) 400 mL/min  Arterial Pressure (mmHg) -110 mmHg  Venous Pressure (mmHg) 130 mmHg  Transmembrane Pressure (mmHg) 90 mmHg  Ultrafiltration Rate (mL/min) 1030 mL/min  Dialysate Flow Rate (mL/min) 800 ml/min  Conductivity: Machine  15.3  Dialysis Fluid Bolus Normal Saline  Bolus Amount (mL) 250 mL  Intra-Hemodialysis Comments Tx initiated  Hemodialysis Catheter Right Internal jugular Double-lumen  No Placement Date or Time found.   Placed prior to admission: Yes  Orientation: Right  Access Location: Internal jugular  Hemodialysis Catheter Type: Double-lumen  Site Condition No complications  Blue Lumen Status Infusing  Red Lumen Status Infusing

## 2018-04-14 NOTE — Consult Note (Signed)
Reason for Consult:PAD left leg rest pain Referring Physician: Fritzi Mandes, MD  Stanley [name] is an 58 y.o. male.  HPI: This is a 58 year old male patient with known peripheral arterial occlusive disease, previous endovascular interventions, end-stage renal disease on hemodialysis, who is admitted to the medical service with multiple medical issues.  He complains of a 3-day history of left lower extremity rest pain.  Able to move his left foot and wiggle his toes and has intact sensation.  He states that he has to hang the leg off the bed to alleviate the pain.  Past Medical History:  Diagnosis Date  . Alcohol abuse   . CHF (congestive heart failure) ([place])   . Cirrhosis ([place])   . Coronary artery disease 2009  . Drug abuse ([place])   . End stage renal disease on dialysis [facility]) NEPHROLOGIST-   DR [facility]  IN [place]   HEMODIALYSIS --   TUES/  THURS/  SAT  . Gastrointestinal bleed 06/13/2017   From chart...hx of multiple GI bleeds  . GERD (gastroesophageal reflux disease)   . Hyperlipidemia   . Hypertension   . PAD (peripheral artery disease) ([place])   . Renal insufficiency    Per pt, 32 oz fluid restriction per day  . S/P triple vessel bypass 06/09/2016   2009ish  . Suicidal ideation    & HOMICIDAL IDEATION --  06-16-2013   ADMITTED TO BEHAVIOR HEALTH    Past Surgical History:  Procedure Laterality Date  . A/V FISTULAGRAM Right 06/06/2017   Procedure: A/V FISTULAGRAM;  Surgeon: Katha Cabal, MD;  Location: [place] CV LAB;  Service: Cardiovascular;  Laterality: Right;  . A/V SHUNT INTERVENTION N/A 06/06/2017   Procedure: A/V SHUNT INTERVENTION;  Surgeon: Katha Cabal, MD;  Location: [place] [facility];  Service: Cardiovascular;  Laterality: N/A;  . AGILE CAPSULE N/A 06/19/2016   Procedure: AGILE CAPSULE;  Surgeon: [name], MD;  Location: [facility] ENDOSCOPY;  Service: Endoscopy;  Laterality: N/A;  . COLONOSCOPY WITH PROPOFOL N/A 06/18/2016   Procedure: COLONOSCOPY WITH  PROPOFOL;  Surgeon: [name], MD;  Location: [facility] ENDOSCOPY;  Service: Endoscopy;  Laterality: N/A;  . COLONOSCOPY WITH PROPOFOL N/A 08/12/2016   Procedure: COLONOSCOPY WITH PROPOFOL;  Surgeon: [name], MD;  Location: [facility] ENDOSCOPY;  Service: Endoscopy;  Laterality: N/A;  . COLONOSCOPY WITH PROPOFOL N/A 05/05/2017   Procedure: COLONOSCOPY WITH PROPOFOL;  Surgeon: Manya Silvas, MD;  Location: [facility] ENDOSCOPY;  Service: Endoscopy;  Laterality: N/A;  . CORONARY ANGIOPLASTY  ?   PT UNABLE TO TELL IF  BEFORE OR AFTER  CABG  . CORONARY ARTERY BYPASS GRAFT  2008  ([place] , [place])   3 VESSEL  . DIALYSIS FISTULA CREATION  LAST SURGERY  APPOX  2008  . ENTEROSCOPY N/A 05/10/2016   Procedure: ENTEROSCOPY;  Surgeon: Jerene Bears, MD;  Location: [place];  Service: Gastroenterology;  Laterality: N/A;  . ENTEROSCOPY N/A 08/12/2016   Procedure: ENTEROSCOPY;  Surgeon: [name], MD;  Location: [facility] ENDOSCOPY;  Service: Endoscopy;  Laterality: N/A;  . ENTEROSCOPY Left 06/03/2017   Procedure: ENTEROSCOPY;  Surgeon: Virgel Manifold, MD;  Location: [facility] ENDOSCOPY;  Service: Endoscopy;  Laterality: Left;  Procedure date will ultimately depend on when patient is medically optimized before the procedure, pending hemodialysis and blood transfusions etc. Will place on schedule and change depending on clinical status.   . ENTEROSCOPY N/A 06/05/2017   Procedure: ENTEROSCOPY;  Surgeon: Virgel Manifold, MD;  Location: [facility] ENDOSCOPY;  Service:  Endoscopy;  Laterality: N/A;  . ENTEROSCOPY N/A 06/15/2017   Procedure: Push ENTEROSCOPY;  Surgeon: [name], MD;  Location: [facility] ENDOSCOPY;  Service: Endoscopy;  Laterality: N/A;  . ESOPHAGOGASTRODUODENOSCOPY N/A 05/07/2015   Procedure: ESOPHAGOGASTRODUODENOSCOPY (EGD);  Surgeon: Hulen Luster, MD;  Location: [facility] ENDOSCOPY;  Service: Endoscopy;  Laterality: N/A;  . ESOPHAGOGASTRODUODENOSCOPY (EGD) WITH PROPOFOL N/A 05/17/2015   Procedure: ESOPHAGOGASTRODUODENOSCOPY  (EGD) WITH PROPOFOL;  Surgeon: [name], MD;  Location: [facility] ENDOSCOPY;  Service: Endoscopy;  Laterality: N/A;  . ESOPHAGOGASTRODUODENOSCOPY (EGD) WITH PROPOFOL N/A 01/20/2016   Procedure: ESOPHAGOGASTRODUODENOSCOPY (EGD) WITH PROPOFOL;  Surgeon: [name], MD;  Location: [facility] ENDOSCOPY;  Service: Endoscopy;  Laterality: N/A;  . ESOPHAGOGASTRODUODENOSCOPY (EGD) WITH PROPOFOL N/A 04/17/2016   Procedure: ESOPHAGOGASTRODUODENOSCOPY (EGD) WITH PROPOFOL;  Surgeon: Lin Landsman, MD;  Location: [facility] ENDOSCOPY;  Service: Gastroenterology;  Laterality: N/A;  . ESOPHAGOGASTRODUODENOSCOPY (EGD) WITH PROPOFOL  05/09/2016   Procedure: ESOPHAGOGASTRODUODENOSCOPY (EGD) WITH PROPOFOL;  Surgeon: Jerene Bears, MD;  Location: [place];  Service: Endoscopy;;  . ESOPHAGOGASTRODUODENOSCOPY (EGD) WITH PROPOFOL N/A 06/16/2016   Procedure: ESOPHAGOGASTRODUODENOSCOPY (EGD) WITH PROPOFOL;  Surgeon: [name], MD;  Location: [facility] ENDOSCOPY;  Service: Endoscopy;  Laterality: N/A;  . ESOPHAGOGASTRODUODENOSCOPY (EGD) WITH PROPOFOL N/A 05/05/2017   Procedure: ESOPHAGOGASTRODUODENOSCOPY (EGD) WITH PROPOFOL;  Surgeon: Manya Silvas, MD;  Location: [facility] ENDOSCOPY;  Service: Endoscopy;  Laterality: N/A;  . ESOPHAGOGASTRODUODENOSCOPY (EGD) WITH PROPOFOL N/A 06/15/2017   Procedure: ESOPHAGOGASTRODUODENOSCOPY (EGD) WITH PROPOFOL;  Surgeon: [name], MD;  Location: [facility] ENDOSCOPY;  Service: Endoscopy;  Laterality: N/A;  . GIVENS CAPSULE STUDY N/A 05/07/2016   Procedure: GIVENS CAPSULE STUDY;  Surgeon: Doran Stabler, MD;  Location: [place];  Service: Endoscopy;  Laterality: N/A;  . MANDIBULAR HARDWARE REMOVAL N/A 07/29/2013   Procedure: REMOVAL OF ARCH BARS;  Surgeon: Theodoro Kos, DO;  Location: [place];  Service: Plastics;  Laterality: N/A;  . ORIF MANDIBULAR FRACTURE N/A 06/05/2013   Procedure: REPAIR OF MANDIBULAR FRACTURE x 2 with maxillo-mandibular fixation ;  Surgeon: Theodoro Kos, DO;   Location: [place];  Service: Plastics;  Laterality: N/A;  . PARACENTESIS    . PERIPHERAL ARTERIAL STENT GRAFT Left     Family History  Problem Relation Age of Onset  . Colon cancer Mother   . Cancer Father   . Cancer Sister   . Kidney disease Brother     Social History:  reports that he has been smoking cigarettes. He has a 6.00 pack-year smoking history. He has never used smokeless tobacco. He reports that he does not drink alcohol or use drugs.  Allergies: No Known Allergies  Medications: All reviewed.  Results for orders placed or performed during the hospital encounter of 04/14/18 (from the past 48 hour(s))  Renal function panel     Status: Abnormal   Collection Time: 04/14/18  3:34 PM  Result Value Ref Range   Sodium 139 135 - 145 mmol/L   Potassium 4.6 3.5 - 5.1 mmol/L   Chloride 89 (L) 98 - 111 mmol/L   CO2 38 (H) 22 - 32 mmol/L   Glucose, Bld 81 70 - 99 mg/dL   BUN 15 6 - 20 mg/dL   Creatinine, Ser 5.62 (H) 0.61 - 1.24 mg/dL   Calcium 8.9 8.9 - 10.3 mg/dL   Phosphorus 4.9 (H) 2.5 - 4.6 mg/dL   Albumin 3.3 (L) 3.5 - 5.0 g/dL   GFR calc non Af Amer 10 (L) >60 mL/min   GFR calc Af Wyvonnia Lora  12 (L) >60 mL/min   Anion gap 12 5 - 15    Comment: Performed at [facility], [address] [place], [place] [address]  CBC with Differential/Platelet     Status: Abnormal   Collection Time: 04/14/18  3:34 PM  Result Value Ref Range   WBC 6.9 4.0 - 10.5 K/uL   RBC 5.12 4.22 - 5.81 MIL/uL   Hemoglobin 12.8 (L) 13.0 - 17.0 g/dL   HCT 45.0 39.0 - 52.0 %   MCV 87.9 80.0 - 100.0 fL   MCH 25.0 (L) 26.0 - 34.0 pg   MCHC 28.4 (L) 30.0 - 36.0 g/dL   RDW 22.1 (H) 11.5 - 15.5 %   Platelets 302 150 - 400 K/uL   nRBC 0.0 0.0 - 0.2 %   Neutrophils Relative % 68 %   Neutro Abs 4.7 1.7 - 7.7 K/uL   Lymphocytes Relative 16 %   Lymphs Abs 1.1 0.7 - 4.0 K/uL   Monocytes Relative 12 %   Monocytes Absolute 0.8 0.1 - 1.0 K/uL   Eosinophils Relative 3 %   Eosinophils Absolute 0.2 0.0 -  0.5 K/uL   Basophils Relative 1 %   Basophils Absolute 0.1 0.0 - 0.1 K/uL   Immature Granulocytes 0 %   Abs Immature Granulocytes 0.03 0.00 - 0.07 K/uL    Comment: Performed at [facility], [address] [place], [place] [address]  Basic metabolic panel     Status: Abnormal   Collection Time: 04/14/18  3:34 PM  Result Value Ref Range   Sodium 138 135 - 145 mmol/L   Potassium 4.6 3.5 - 5.1 mmol/L   Chloride 89 (L) 98 - 111 mmol/L   CO2 38 (H) 22 - 32 mmol/L   Glucose, Bld 82 70 - 99 mg/dL   BUN 15 6 - 20 mg/dL   Creatinine, Ser 5.80 (H) 0.61 - 1.24 mg/dL   Calcium 8.9 8.9 - 10.3 mg/dL   GFR calc non Af Amer 10 (L) >60 mL/min   GFR calc Af Amer 11 (L) >60 mL/min   Anion gap 11 5 - 15    Comment: Performed at [facility], [address] [place], [place] [address]  APTT     Status: None   Collection Time: 04/14/18  3:34 PM  Result Value Ref Range   aPTT 35 24 - 36 seconds    Comment: Performed at [facility], [address] [place], [place] [address]  Protime-INR     Status: None   Collection Time: 04/14/18  3:34 PM  Result Value Ref Range   Prothrombin Time 13.2 11.4 - 15.2 seconds   INR 1.01     Comment: Performed at [facility], [address] [place], [place] [address]    No results found.  Review of Systems  Constitutional: Negative.   HENT: Negative.   Eyes: Negative.   Respiratory: Negative.   Cardiovascular: Negative.   Gastrointestinal: Positive for heartburn.  Genitourinary: Negative.   Musculoskeletal: Negative.   Skin: Negative.  Negative for rash.  Neurological: Negative.   Endo/Heme/Allergies: Negative.   Psychiatric/Behavioral: Negative.   All other systems reviewed and are negative.  Blood pressure 122/80, pulse 88, temperature 98.4 F (36.9 C), temperature source Oral, resp. rate 18, height 6\' 3"  (1.905 m), weight 65.7 kg, SpO2 94 %. Physical Exam  Nursing note and vitals reviewed. Constitutional: He  is oriented to person, place, and time. Vital signs are normal. He appears well-developed.  HENT:  Head: Normocephalic.  Eyes: Pupils are equal, round, and reactive to light.  Neck: Normal range of motion. Neck supple. No JVD present. Carotid bruit is not present.  Cardiovascular: Normal rate, regular rhythm and normal heart sounds.  Pulses:      Radial pulses are 1+ on the right side and 1+ on the left side.       Femoral pulses are 1+ on the right side and 1+ on the left side.      Popliteal pulses are 1+ on the right side and 1+ on the left side.       Dorsalis pedis pulses are 1+ on the right side and 0 on the left side.       Posterior tibial pulses are 1+ on the right side and 0 on the left side.  GI: Soft. Bowel sounds are normal. There is no abdominal tenderness.  Musculoskeletal: Normal range of motion.  Neurological: He is alert and oriented to person, place, and time. He has normal strength and normal reflexes. He displays a negative Romberg sign.  Skin: Skin is warm.  Psychiatric: He has a normal mood and affect. His speech is normal and behavior is normal. Judgment and thought content normal. Cognition and memory are normal.    On clinical examination the patient is alert and oriented x3 not in acute distress. The patient's head is normocephalic atraumatic pupils are equal and reactive to light and accommodation. Neck is supple no bruits no murmurs Chest lungs are coarse bilaterally Heart: Regular rhythm Abdomen: Soft, positive bowel sounds. Upper extremities: Warm, pulses are positive bilaterally, full range of motion is noted bilaterally.  Lower extremities: Patient's right foot is warm to palpation with weakly palpable pulses, the left lower extremity he has rubor dependency, the skin is cool to touch, pulses are nonpalpable, the patient has sensation intact and motor intact. Assessment/Plan:  This is a 58 year old male patient with chronic peripheral arterial occlusive  disease and left lower extremity critical limb ischemia.  She also has end-stage renal disease, hypertension, lipidemia, and gastroesophageal reflux disease.  At this time I will recommend continuing heparin anticoagulation.  The patient may require aortogram with runoff of the left lower extremity for further evaluation and possible endovascular intervention.  We will continue to monitor his left lower extremity with Doppler signals, and neuro checks.  Elmore Guise 04/14/2018, 8:56 PM

## 2018-04-14 NOTE — Consult Note (Addendum)
ANTICOAGULATION CONSULT NOTE - Initial Consult  Pharmacy Consult for Heparin Indication: DVT  No Known Allergies  Patient Measurements: Height: 6\' 3"  (190.5 cm) Weight: 151 lb 14.4 oz (68.9 kg) IBW/kg (Calculated) : 84.5 Heparin Dosing Weight: 68.9 kg  Vital Signs: Temp: 98 F (36.7 C) (02/01 1045) Temp Source: Oral (02/01 1045) BP: 133/96 (02/01 1430) Pulse Rate: 99 (02/01 1430)  Labs: No results for input(s): HGB, HCT, PLT, APTT, LABPROT, INR, HEPARINUNFRC, HEPRLOWMOCWT, CREATININE, CKTOTAL, CKMB, TROPONINI in the last 72 hours.  Estimated Creatinine Clearance: 7.7 mL/min (A) (by C-G formula based on SCr of 10.24 mg/dL (H)).   Medical History: Past Medical History:  Diagnosis Date  . Alcohol abuse   . CHF (congestive heart failure) ([place])   . Cirrhosis ([place])   . Coronary artery disease 2009  . Drug abuse ([place])   . End stage renal disease on dialysis [name]) NEPHROLOGIST-   DR [facility]  IN [place]   HEMODIALYSIS --   TUES/  THURS/  SAT  . Gastrointestinal bleed 06/13/2017   From chart...hx of multiple GI bleeds  . GERD (gastroesophageal reflux disease)   . Hyperlipidemia   . Hypertension   . PAD (peripheral artery disease) ([place])   . Renal insufficiency    Per pt, 32 oz fluid restriction per day  . S/P triple vessel bypass 06/09/2016   2009ish  . Suicidal ideation    & HOMICIDAL IDEATION --  06-16-2013   ADMITTED TO BEHAVIOR HEALTH    Medications:  Patient not on any PTA anticoagulants.  Assessment: 58 yo male presented to ED for routine dialysis and was found to have DVT with pain in left leg x 5 days.   Goal of Therapy:  Heparin level 0.3-0.7 units/ml Monitor platelets by anticoagulation protocol: Yes   Plan:  Ordered baseline labs. H&H stable. Will order heparin 4000 unit bolus, followed by 1150 unit/hr continuous infusion. Will order HL for 2/2 0000. Pharmacy will continue to monitor.   Paticia Stack, PharmD Pharmacy Resident  04/14/2018 3:12  PM

## 2018-04-14 NOTE — Progress Notes (Signed)
[facility], [place] 04/14/18  Subjective:   Patient presents to the emergency room for inability to get dialysis.  He got into an argument with the driver of public transportation.  Therefore he is not able to get to dialysis.  He states he is short of breath and has excessive lower extremity edema.  Severe ascites. C/o left foot pain  Objective:  Vital signs in last 24 hours:  Temp:  [98 F (36.7 C)] 98 F (36.7 C) (02/01 1045) Pulse Rate:  [81-91] 81 (02/01 1245) Resp:  [0-22] 0 (02/01 1245) BP: (110-135)/(74-94) 125/81 (02/01 1245) SpO2:  [96 %-100 %] 100 % (02/01 1245) Weight:  [68.9 kg] 68.9 kg (02/01 1045)  Weight change:  Filed Weights   04/14/18 1026 04/14/18 1045  Weight: 68.9 kg 68.9 kg    Intake/Output:   No intake or output data in the 24 hours ending 04/14/18 1314   Physical Exam: General:  No acute distress, laying in the bed  HEENT  moist oral mucous membranes  Neck  supple  Pulm/lungs  normal breathing effort, bibasilar crackles  CVS/Heart  no rub  Abdomen:   Distended, ascites  Extremities:  2+ bilateral lower extremity edema  Neurologic:  Alert, able to answer questions  Skin:  No acute rashes  Access:  Right IJ PermCath       Basic Metabolic Panel:  Recent Labs  Lab 04/08/18 1622 04/09/18 1217  NA 141 138  K 5.7* 6.6*  CL 93* 91*  CO2 38* 34*  GLUCOSE 93 86  BUN 39* 45*  CREATININE 9.15* 10.24*  CALCIUM 9.4 9.1     CBC: Recent Labs  Lab 04/08/18 1622 04/09/18 1217  WBC 6.2 6.2  NEUTROABS 4.4 4.2  HGB 11.1* 11.0*  HCT 40.0 38.0*  MCV 89.7 87.2  PLT 229 239      Lab Results  Component Value Date   HEPBSAG Negative 06/16/2017   HEPBSAB Reactive 03/01/2017   HEPBIGM Negative 06/01/2016      Microbiology:  No results found for this or any previous visit (from the past 240 hour(s)).  Coagulation Studies: No results for input(s): LABPROT, INR in the last 72 hours.  Urinalysis: No  results for input(s): COLORURINE, LABSPEC, PHURINE, GLUCOSEU, HGBUR, BILIRUBINUR, KETONESUR, PROTEINUR, UROBILINOGEN, NITRITE, LEUKOCYTESUR in the last 72 hours.  Invalid input(s): APPERANCEUR    Imaging: No results found.   Medications:    . Chlorhexidine Gluconate Cloth  6 each Topical Q0600     Assessment/ Plan:  58 y.o. male withend stage renal disease on hemodialysis secondary to Alport's syndrome, ascites, hypertension, anemia of chronic kidney disease, coronary artery disease, peripheral vascular disease, hyperlipidemia, gastrointestinal AVMs, pulmonary hypertension  [facility] TTS RIJ permcath. 210 min/  1.  Lower extremity edema/volume overload 2.  End-stage renal disease 3.  Ascites 4.  Hyperkalemia 5.  Anemia of chronic kidney disease 6.  Peripheral vascular disease  Urgent hemodialysis for volume overload. Check renal panel Goal for fluid removal as tolerated. EDW ?68-69 kg with ascites Hgb 11. Continue to monitor D/w ER physician to consider doppler of left foot Patient can be discharged from ER after hemodialysis if stable   LOS: 0 Kee Drudge 2/1/20201:14 PM  [place], [place]  Note: This note was prepared with Dragon dictation. Any transcription errors are unintentional

## 2018-04-14 NOTE — Progress Notes (Signed)
HD Tx completed, tolerated well.    04/14/18 1415  Vital Signs  Pulse Rate 89  Resp 20  BP (!) 148/84  Oxygen Therapy  SpO2 94 %  During Hemodialysis Assessment  Blood Flow Rate (mL/min) 400 mL/min  Arterial Pressure (mmHg) -120 mmHg  Venous Pressure (mmHg) 130 mmHg  Transmembrane Pressure (mmHg) 60 mmHg  Ultrafiltration Rate (mL/min) 1030 mL/min  Dialysate Flow Rate (mL/min) 800 ml/min  Conductivity: Machine  14  HD Safety Checks Performed Yes  KECN 82.2 KECN  Dialysis Fluid Bolus Normal Saline  Bolus Amount (mL) 250 mL (250)  Intra-Hemodialysis Comments Tx completed;Tolerated well

## 2018-04-14 NOTE — Progress Notes (Signed)
Pre HD Tx   04/14/18 1045  Vital Signs  Temp 98 F (36.7 C)  Temp Source Oral  Pulse Rate 81  Pulse Rate Source Monitor  Resp (!) 21  BP (!) 112/91  BP Location Left Arm  BP Method Automatic  Oxygen Therapy  SpO2 98 %  O2 Device Room Air  Pulse Oximetry Type Continuous  Pain Assessment  Pain Scale 0-10  Pain Score 9  Pain Type Acute pain  Pain Location Foot  Pain Orientation Left  Dialysis Weight  Weight 68.9 kg  Time-Out for Hemodialysis  What Procedure? HD   Pt Identifiers(min of two) First/Last Name;MRN/Account#  Correct Site? Yes  Correct Side? Yes  Correct Procedure? Yes  Consents Verified? Yes  Rad Studies Available? N/A  Safety Precautions Reviewed? Yes  Engineer, civil (consulting) Number 5  Station Number 3  UF/Alarm Test Passed  Conductivity: Meter 14  Conductivity: Machine  13.9  pH 7.4  Reverse Osmosis Main  Normal Saline Lot Number [id number]  Dialyzer Lot Number [id number]  Disposable Set Lot Number [id number]  Machine Temperature 98.6 F (37 C)  Musician and Audible Yes  Blood Lines Intact and Secured Yes  Pre Treatment Patient Checks  Vascular access used during treatment Catheter  Patient is receiving dialysis in a chair Yes  Hepatitis B Surface Antigen Results Negative  Date Hepatitis B Surface Antigen Drawn 06/16/17  Hepatitis B Surface Antibody  (>10)  Date Hepatitis B Surface Antibody Drawn 06/16/17  Hemodialysis Consent Verified Yes  Hemodialysis Standing Orders Initiated Yes  ECG (Telemetry) Monitor On Yes  Prime Ordered Normal Saline  Length of  DialysisTreatment -hour(s) 3.5 Hour(s)  Dialysis Treatment Comments Na 140  Dialyzer Elisio 17H NR  Dialysate 2K, 2.5 Ca  Dialysis Anticoagulant None  Dialysate Flow Ordered 800  Blood Flow Rate Ordered 400 mL/min  Ultrafiltration Goal 2.5 Liters  Dialysis Blood Pressure Support Ordered Normal Saline  Education / Care Plan  Dialysis Education Provided Yes  Documented Education in Care  Plan Yes  Hemodialysis Catheter Right Internal jugular Double-lumen  No Placement Date or Time found.   Placed prior to admission: Yes  Orientation: Right  Access Location: Internal jugular  Hemodialysis Catheter Type: Double-lumen  Site Condition No complications  Blue Lumen Status Flushed  Red Lumen Status Flushed

## 2018-04-14 NOTE — Progress Notes (Signed)
Post HD Tx    04/14/18 1430  Hand-Off documentation  Report given to (Full Name) Janett Billow ED, RN   Report received from (Full Name) Daryel Gerald, RN   Vital Signs  Temp 98 F (36.7 C)  Temp Source Oral  Pulse Rate 99  Pulse Rate Source Monitor  Resp 12  BP (!) 133/96  BP Location Left Arm  BP Method Automatic  Patient Position (if appropriate) Lying  Oxygen Therapy  SpO2 (!) 89 %  O2 Device Room Air  Pain Assessment  Pain Scale 0-10  Pain Score 0  Dialysis Weight  Weight 65.7 kg  Type of Weight Post-Dialysis  Post-Hemodialysis Assessment  Rinseback Volume (mL) 250 mL  KECN 82.2 V  Dialyzer Clearance Lightly streaked  Duration of HD Treatment -hour(s) 3.5 hour(s)  Hemodialysis Intake (mL) 500 mL  UF Total -Machine (mL) 3508 mL  Net UF (mL) 3008 mL  Tolerated HD Treatment Yes  Hemodialysis Catheter Right Internal jugular Double-lumen  No Placement Date or Time found.   Placed prior to admission: Yes  Orientation: Right  Access Location: Internal jugular  Hemodialysis Catheter Type: Double-lumen  Site Condition No complications  Blue Lumen Status Heparin locked  Red Lumen Status Heparin locked  Post treatment catheter status Capped and Clamped

## 2018-04-14 NOTE — ED Triage Notes (Signed)
Here to get dialysis. Last received Thursday. Dialysis is expecting pt. No complaints.

## 2018-04-14 NOTE — H&P (Signed)
[facility] NAME: Stanley [name]    MR#:  [id number]  DATE OF BIRTH:  Aug 06, 1960  DATE OF ADMISSION:  04/14/2018  PRIMARY CARE PHYSICIAN: Alene Mires Elyse Jarvis, MD   REQUESTING/REFERRING PHYSICIAN: Dr Quentin Cornwall  CHIEF COMPLAINT:   Patient here after dialysis with history of left leg pain on and off. HISTORY OF PRESENT ILLNESS:  Stanley [name]  is a 58 y.o. male with a known history of CAD,, hypertension, end-stage renal disease on dialysis secondary to Alport's syndrome,, CHF comes to the emergency room after dialysis in the hospital with left foot pain. Patient had been having symptoms of claudication on and off. He has history of severe PAD with stent put in the left lower extremity according to patient with Dr. dew in the past. Patient's left foot dorsalis pedis and posterior tibial pulses were absent on the left foot. Based on the symptoms patient will be on IV heparin drip and vascular surgery will see patient in consultation.  Dr. [name] nephrology aware. Vascular surgery Dr.Antezano is aware.   PAST MEDICAL HISTORY:   Past Medical History:  Diagnosis Date  . Alcohol abuse   . CHF (congestive heart failure) ([place])   . Cirrhosis ([place])   . Coronary artery disease 2009  . Drug abuse ([place])   . End stage renal disease on dialysis [facility]) NEPHROLOGIST-   DR [facility]  IN [place]   HEMODIALYSIS --   TUES/  THURS/  SAT  . Gastrointestinal bleed 06/13/2017   From chart...hx of multiple GI bleeds  . GERD (gastroesophageal reflux disease)   . Hyperlipidemia   . Hypertension   . PAD (peripheral artery disease) ([place])   . Renal insufficiency    Per pt, 32 oz fluid restriction per day  . S/P triple vessel bypass 06/09/2016   2009ish  . Suicidal ideation    & HOMICIDAL IDEATION --  06-16-2013   ADMITTED TO BEHAVIOR HEALTH    PAST SURGICAL HISTOIRY:   Past Surgical History:  Procedure Laterality Date  . A/V FISTULAGRAM Right  06/06/2017   Procedure: A/V FISTULAGRAM;  Surgeon: Katha Cabal, MD;  Location: [place] CV LAB;  Service: Cardiovascular;  Laterality: Right;  . A/V SHUNT INTERVENTION N/A 06/06/2017   Procedure: A/V SHUNT INTERVENTION;  Surgeon: Katha Cabal, MD;  Location: [place] CV LAB;  Service: Cardiovascular;  Laterality: N/A;  . AGILE CAPSULE N/A 06/19/2016   Procedure: AGILE CAPSULE;  Surgeon: [name], MD;  Location: [facility] ENDOSCOPY;  Service: Endoscopy;  Laterality: N/A;  . COLONOSCOPY WITH PROPOFOL N/A 06/18/2016   Procedure: COLONOSCOPY WITH PROPOFOL;  Surgeon: [name], MD;  Location: [facility] ENDOSCOPY;  Service: Endoscopy;  Laterality: N/A;  . COLONOSCOPY WITH PROPOFOL N/A 08/12/2016   Procedure: COLONOSCOPY WITH PROPOFOL;  Surgeon: [name], MD;  Location: [facility] ENDOSCOPY;  Service: Endoscopy;  Laterality: N/A;  . COLONOSCOPY WITH PROPOFOL N/A 05/05/2017   Procedure: COLONOSCOPY WITH PROPOFOL;  Surgeon: Manya Silvas, MD;  Location: [facility] ENDOSCOPY;  Service: Endoscopy;  Laterality: N/A;  . CORONARY ANGIOPLASTY  ?   PT UNABLE TO TELL IF  BEFORE OR AFTER  CABG  . CORONARY ARTERY BYPASS GRAFT  2008  ([place])   3 VESSEL  . DIALYSIS FISTULA CREATION  LAST SURGERY  APPOX  2008  . ENTEROSCOPY N/A 05/10/2016   Procedure: ENTEROSCOPY;  Surgeon: Jerene Bears, MD;  Location: [place];  Service: Gastroenterology;  Laterality: N/A;  . ENTEROSCOPY N/A  08/12/2016   Procedure: ENTEROSCOPY;  Surgeon: [name], MD;  Location: [facility] ENDOSCOPY;  Service: Endoscopy;  Laterality: N/A;  . ENTEROSCOPY Left 06/03/2017   Procedure: ENTEROSCOPY;  Surgeon: Virgel Manifold, MD;  Location: [facility] ENDOSCOPY;  Service: Endoscopy;  Laterality: Left;  Procedure date will ultimately depend on when patient is medically optimized before the procedure, pending hemodialysis and blood transfusions etc. Will place on schedule and change depending on clinical status.   . ENTEROSCOPY N/A 06/05/2017    Procedure: ENTEROSCOPY;  Surgeon: Virgel Manifold, MD;  Location: [facility] ENDOSCOPY;  Service: Endoscopy;  Laterality: N/A;  . ENTEROSCOPY N/A 06/15/2017   Procedure: Push ENTEROSCOPY;  Surgeon: [name], MD;  Location: [facility] ENDOSCOPY;  Service: Endoscopy;  Laterality: N/A;  . ESOPHAGOGASTRODUODENOSCOPY N/A 05/07/2015   Procedure: ESOPHAGOGASTRODUODENOSCOPY (EGD);  Surgeon: Hulen Luster, MD;  Location: [facility] ENDOSCOPY;  Service: Endoscopy;  Laterality: N/A;  . ESOPHAGOGASTRODUODENOSCOPY (EGD) WITH PROPOFOL N/A 05/17/2015   Procedure: ESOPHAGOGASTRODUODENOSCOPY (EGD) WITH PROPOFOL;  Surgeon: [name], MD;  Location: [facility] ENDOSCOPY;  Service: Endoscopy;  Laterality: N/A;  . ESOPHAGOGASTRODUODENOSCOPY (EGD) WITH PROPOFOL N/A 01/20/2016   Procedure: ESOPHAGOGASTRODUODENOSCOPY (EGD) WITH PROPOFOL;  Surgeon: [name], MD;  Location: [facility] ENDOSCOPY;  Service: Endoscopy;  Laterality: N/A;  . ESOPHAGOGASTRODUODENOSCOPY (EGD) WITH PROPOFOL N/A 04/17/2016   Procedure: ESOPHAGOGASTRODUODENOSCOPY (EGD) WITH PROPOFOL;  Surgeon: Lin Landsman, MD;  Location: [facility] ENDOSCOPY;  Service: Gastroenterology;  Laterality: N/A;  . ESOPHAGOGASTRODUODENOSCOPY (EGD) WITH PROPOFOL  05/09/2016   Procedure: ESOPHAGOGASTRODUODENOSCOPY (EGD) WITH PROPOFOL;  Surgeon: Jerene Bears, MD;  Location: [place];  Service: Endoscopy;;  . ESOPHAGOGASTRODUODENOSCOPY (EGD) WITH PROPOFOL N/A 06/16/2016   Procedure: ESOPHAGOGASTRODUODENOSCOPY (EGD) WITH PROPOFOL;  Surgeon: [name], MD;  Location: [facility] ENDOSCOPY;  Service: Endoscopy;  Laterality: N/A;  . ESOPHAGOGASTRODUODENOSCOPY (EGD) WITH PROPOFOL N/A 05/05/2017   Procedure: ESOPHAGOGASTRODUODENOSCOPY (EGD) WITH PROPOFOL;  Surgeon: Manya Silvas, MD;  Location: [facility] ENDOSCOPY;  Service: Endoscopy;  Laterality: N/A;  . ESOPHAGOGASTRODUODENOSCOPY (EGD) WITH PROPOFOL N/A 06/15/2017   Procedure: ESOPHAGOGASTRODUODENOSCOPY (EGD) WITH PROPOFOL;  Surgeon: [name], MD;  Location: [facility]  ENDOSCOPY;  Service: Endoscopy;  Laterality: N/A;  . GIVENS CAPSULE STUDY N/A 05/07/2016   Procedure: GIVENS CAPSULE STUDY;  Surgeon: Doran Stabler, MD;  Location: [place];  Service: Endoscopy;  Laterality: N/A;  . MANDIBULAR HARDWARE REMOVAL N/A 07/29/2013   Procedure: REMOVAL OF ARCH BARS;  Surgeon: Theodoro Kos, DO;  Location: [place];  Service: Plastics;  Laterality: N/A;  . ORIF MANDIBULAR FRACTURE N/A 06/05/2013   Procedure: REPAIR OF MANDIBULAR FRACTURE x 2 with maxillo-mandibular fixation ;  Surgeon: Theodoro Kos, DO;  Location: [place];  Service: Plastics;  Laterality: N/A;  . PARACENTESIS    . PERIPHERAL ARTERIAL STENT GRAFT Left     SOCIAL HISTORY:   Social History   Tobacco Use  . Smoking status: Current Every Day Smoker    Packs/day: 0.15    Years: 40.00    Pack years: 6.00    Types: Cigarettes  . Smokeless tobacco: Never Used  Substance Use Topics  . Alcohol use: No    Comment: pt reports quitting after learning about cirrhosis    FAMILY HISTORY:   Family History  Problem Relation Age of Onset  . Colon cancer Mother   . Cancer Father   . Cancer Sister   . Kidney disease Brother     DRUG ALLERGIES:  No Known Allergies  REVIEW OF SYSTEMS:  Review of Systems  Constitutional: Negative for  chills, fever and weight loss.  HENT: Negative for ear discharge, ear pain and nosebleeds.   Eyes: Negative for blurred vision, pain and discharge.  Respiratory: Negative for sputum production, shortness of breath, wheezing and stridor.   Cardiovascular: Positive for claudication. Negative for chest pain, palpitations, orthopnea and PND.  Gastrointestinal: Negative for abdominal pain, diarrhea, nausea and vomiting.  Genitourinary: Negative for frequency and urgency.  Musculoskeletal: Positive for joint pain. Negative for back pain.  Neurological: Negative for sensory change, speech change, focal weakness and weakness.  Psychiatric/Behavioral:  Negative for depression and hallucinations. The patient is not nervous/anxious.      MEDICATIONS AT HOME:   Prior to Admission medications   Medication Sig Start Date End Date Taking? Authorizing Provider  acetaminophen (TYLENOL) 325 MG tablet Take 2 tablets (650 mg total) by mouth every 6 (six) hours as needed for mild pain (or Fever >/= 101). Patient not taking: Reported on 04/09/2018 03/27/18   Nicholes Mango, MD  budesonide-formoterol [facility]) 160-4.5 MCG/ACT inhaler Inhale 2 puffs into the lungs daily. 11/16/16   Vaughan Basta, MD  calcium acetate (PHOSLO) 667 MG capsule Take 2,001 mg by mouth 3 (three) times daily.    [provider]  folic acid (FOLVITE) 1 MG tablet Take 1 tablet (1 mg total) by mouth daily. 10/31/17   Fritzi Mandes, MD  gabapentin (NEURONTIN) 300 MG capsule Take 300 mg by mouth 3 (three) times daily.  08/16/16   [provider]  ipratropium-albuterol (DUONEB) 0.5-2.5 (3) MG/3ML SOLN Take 3 mLs by nebulization every 6 (six) hours as needed. 11/28/16   Loletha Grayer, MD  labetalol (NORMODYNE) 100 MG tablet Take 100 mg by mouth daily.    [provider]  multivitamin (RENA-VIT) TABS tablet Take 1 tablet by mouth at bedtime. 10/31/17   Fritzi Mandes, MD  omeprazole (PRILOSEC) 40 MG capsule Take 40 mg by mouth daily. 09/08/17   [provider]  tiotropium (SPIRIVA HANDIHALER) 18 MCG inhalation capsule Place 1 capsule (18 mcg total) into inhaler and inhale daily. 01/30/18 04/30/18  Demetrios Loll, MD      VITAL SIGNS:  Blood pressure (!) 133/96, pulse 99, temperature 98 F (36.7 C), temperature source Oral, resp. rate 12, height 6\' 3"  (1.905 m), weight 68.9 kg, SpO2 (!) 89 %.  PHYSICAL EXAMINATION:  GENERAL:  58 y.o.-year-old patient lying in the bed with no acute distress.  EYES: Pupils equal, round, reactive to light and accommodation. No scleral icterus. Extraocular muscles intact.  HEENT: Head atraumatic, normocephalic. Oropharynx and  nasopharynx clear.  NECK:  Supple, no jugular venous distention. No thyroid enlargement, no tenderness.  LUNGS: Normal breath sounds bilaterally, no wheezing, rales,rhonchi or crepitation. No use of accessory muscles of respiration.  CARDIOVASCULAR: S1, S2 normal. No murmurs, rubs, or gallops.  ABDOMEN: Soft, nontender, nondistended. Bowel sounds present. No organomegaly or mass.  EXTREMITIES: left foot dorsalis pedis not palpable. Cold to touch. Skin appears normal.  NEUROLOGIC: Cranial nerves II through XII are intact. Muscle strength 5/5 in all extremities. Sensation intact. Gait not checked.  PSYCHIATRIC: The patient is alert and oriented x 3.  SKIN: No obvious rash, lesion, or ulcer.   LABORATORY PANEL:   CBC Recent Labs  Lab 04/09/18 1217  WBC 6.2  HGB 11.0*  HCT 38.0*  PLT 239   ------------------------------------------------------------------------------------------------------------------  Chemistries  Recent Labs  Lab 04/09/18 1217  NA 138  K 6.6*  CL 91*  CO2 34*  GLUCOSE 86  BUN 45*  CREATININE 10.24*  CALCIUM  9.1  AST 31  ALT 13  ALKPHOS 124  BILITOT 1.3*   ------------------------------------------------------------------------------------------------------------------  Cardiac Enzymes Recent Labs  Lab 04/08/18 1622  TROPONINI 0.07*   ------------------------------------------------------------------------------------------------------------------  RADIOLOGY:  No results found.  EKG:    IMPRESSION AND PLAN:   Stanley [name]  is a 58 y.o. male with a known history of CAD,, hypertension, end-stage renal disease on dialysis secondary to Alport's syndrome,, CHF comes to the emergency room after dialysis in the hospital with left foot pain. Patient had been having symptoms of claudication on and of  1. Left foot acute subacute ischemia with history of peripheral arterial disease with procedures and and history of stenting in the past -admit to  medical floor -IV heparin drip -vascular surgery to see patient-- await further recommendations  2. [name]--- patient comes to the ER for his dialysis at the hospital -Dr. [name] aware -continue phoslo  3.HTn -patient noncompliant to meds at home -PRN hydralazine  4. DVT prophylaxis on IV heparin drip  No family in the ER   All the records are reviewed and case discussed with ED provider.   CODE STATUS: FULL  TOTAL TIME TAKING CARE OF THIS PATIENT: *50* minutes.    Fritzi Mandes M.D on 04/14/2018 at 3:29 PM  Between 7am to 6pm - Pager - [phone]  After 6pm go to [email or web address] - password [email or web address] [place] [facility]  Office  [phone]  CC: Primary care physician; [name], MD

## 2018-04-14 NOTE — Progress Notes (Signed)
Family Meeting Note  Patient here after dialysis in the hospital for left foot pain. Patient has history of peripheral arterial disease. Evaluated by vascular surgery. Recommends IV heparin drip and further evaluation if need be. Patient has end-stage renal disease on hemodialysis, hypertension. Issues with noncompliance. Code status address. Patient is a full code Time spent during discussion 16 mins  Fritzi Mandes, MD

## 2018-04-14 NOTE — Progress Notes (Signed)
Post HD Assessment    04/14/18 1446  Neurological  Level of Consciousness Alert  Orientation Level Oriented X4  Respiratory  Respiratory Pattern Regular;Unlabored  Chest Assessment Chest expansion symmetrical  Cough Non-productive  Cardiac  Pulse Regular  Heart Sounds S1, S2  ECG Monitor Yes  Vascular  R Radial Pulse +2  L Radial Pulse +2  Edema Generalized  Generalized Edema +2  Psychosocial  Psychosocial (WDL) WDL

## 2018-04-14 NOTE — ED Provider Notes (Addendum)
[facility] Emergency Department Provider Note    None    (approximate)  I have reviewed the triage vital signs and the nursing notes.   HISTORY  Chief Complaint needs dialysis    HPI Stanley [name] is a 58 y.o. male below listed past medical history well-known to this facility presents to the ER for routine dialysis.  He is otherwise asymptomatic.  Past Medical History:  Diagnosis Date  . Alcohol abuse   . CHF (congestive heart failure) ([place])   . Cirrhosis ([place])   . Coronary artery disease 2009  . Drug abuse ([place])   . End stage renal disease on dialysis [facility]) NEPHROLOGIST-   DR [facility]  IN [place]   HEMODIALYSIS --   TUES/  THURS/  SAT  . Gastrointestinal bleed 06/13/2017   From chart...hx of multiple GI bleeds  . GERD (gastroesophageal reflux disease)   . Hyperlipidemia   . Hypertension   . PAD (peripheral artery disease) ([place])   . Renal insufficiency    Per pt, 32 oz fluid restriction per day  . S/P triple vessel bypass 06/09/2016   2009ish  . Suicidal ideation    & HOMICIDAL IDEATION --  06-16-2013   ADMITTED TO BEHAVIOR HEALTH   Family History  Problem Relation Age of Onset  . Colon cancer Mother   . Cancer Father   . Cancer Sister   . Kidney disease Brother    Past Surgical History:  Procedure Laterality Date  . A/V FISTULAGRAM Right 06/06/2017   Procedure: A/V FISTULAGRAM;  Surgeon: Katha Cabal, MD;  Location: [place] CV LAB;  Service: Cardiovascular;  Laterality: Right;  . A/V SHUNT INTERVENTION N/A 06/06/2017   Procedure: A/V SHUNT INTERVENTION;  Surgeon: Katha Cabal, MD;  Location: [place] CV LAB;  Service: Cardiovascular;  Laterality: N/A;  . AGILE CAPSULE N/A 06/19/2016   Procedure: AGILE CAPSULE;  Surgeon: [name], MD;  Location: [facility] ENDOSCOPY;  Service: Endoscopy;  Laterality: N/A;  . COLONOSCOPY WITH PROPOFOL N/A 06/18/2016   Procedure: COLONOSCOPY WITH PROPOFOL;  Surgeon: [name], MD;   Location: [facility] ENDOSCOPY;  Service: Endoscopy;  Laterality: N/A;  . COLONOSCOPY WITH PROPOFOL N/A 08/12/2016   Procedure: COLONOSCOPY WITH PROPOFOL;  Surgeon: [name], MD;  Location: [facility] ENDOSCOPY;  Service: Endoscopy;  Laterality: N/A;  . COLONOSCOPY WITH PROPOFOL N/A 05/05/2017   Procedure: COLONOSCOPY WITH PROPOFOL;  Surgeon: Manya Silvas, MD;  Location: [facility] ENDOSCOPY;  Service: Endoscopy;  Laterality: N/A;  . CORONARY ANGIOPLASTY  ?   PT UNABLE TO TELL IF  BEFORE OR AFTER  CABG  . CORONARY ARTERY BYPASS GRAFT  2008  ([place] , [place])   3 VESSEL  . DIALYSIS FISTULA CREATION  LAST SURGERY  APPOX  2008  . ENTEROSCOPY N/A 05/10/2016   Procedure: ENTEROSCOPY;  Surgeon: Jerene Bears, MD;  Location: [place];  Service: Gastroenterology;  Laterality: N/A;  . ENTEROSCOPY N/A 08/12/2016   Procedure: ENTEROSCOPY;  Surgeon: [name], MD;  Location: [facility] ENDOSCOPY;  Service: Endoscopy;  Laterality: N/A;  . ENTEROSCOPY Left 06/03/2017   Procedure: ENTEROSCOPY;  Surgeon: Virgel Manifold, MD;  Location: [facility] ENDOSCOPY;  Service: Endoscopy;  Laterality: Left;  Procedure date will ultimately depend on when patient is medically optimized before the procedure, pending hemodialysis and blood transfusions etc. Will place on schedule and change depending on clinical status.   . ENTEROSCOPY N/A 06/05/2017   Procedure: ENTEROSCOPY;  Surgeon: Virgel Manifold, MD;  Location: [facility] ENDOSCOPY;  Service: Endoscopy;  Laterality: N/A;  . ENTEROSCOPY N/A 06/15/2017   Procedure: Push ENTEROSCOPY;  Surgeon: [name], MD;  Location: [facility] ENDOSCOPY;  Service: Endoscopy;  Laterality: N/A;  . ESOPHAGOGASTRODUODENOSCOPY N/A 05/07/2015   Procedure: ESOPHAGOGASTRODUODENOSCOPY (EGD);  Surgeon: Hulen Luster, MD;  Location: [facility] ENDOSCOPY;  Service: Endoscopy;  Laterality: N/A;  . ESOPHAGOGASTRODUODENOSCOPY (EGD) WITH PROPOFOL N/A 05/17/2015   Procedure: ESOPHAGOGASTRODUODENOSCOPY (EGD) WITH PROPOFOL;  Surgeon: [name], MD;  Location: [facility] ENDOSCOPY;  Service: Endoscopy;  Laterality: N/A;  . ESOPHAGOGASTRODUODENOSCOPY (EGD) WITH PROPOFOL N/A 01/20/2016   Procedure: ESOPHAGOGASTRODUODENOSCOPY (EGD) WITH PROPOFOL;  Surgeon: [name], MD;  Location: [facility] ENDOSCOPY;  Service: Endoscopy;  Laterality: N/A;  . ESOPHAGOGASTRODUODENOSCOPY (EGD) WITH PROPOFOL N/A 04/17/2016   Procedure: ESOPHAGOGASTRODUODENOSCOPY (EGD) WITH PROPOFOL;  Surgeon: Lin Landsman, MD;  Location: [facility] ENDOSCOPY;  Service: Gastroenterology;  Laterality: N/A;  . ESOPHAGOGASTRODUODENOSCOPY (EGD) WITH PROPOFOL  05/09/2016   Procedure: ESOPHAGOGASTRODUODENOSCOPY (EGD) WITH PROPOFOL;  Surgeon: Jerene Bears, MD;  Location: [place];  Service: Endoscopy;;  . ESOPHAGOGASTRODUODENOSCOPY (EGD) WITH PROPOFOL N/A 06/16/2016   Procedure: ESOPHAGOGASTRODUODENOSCOPY (EGD) WITH PROPOFOL;  Surgeon: [name], MD;  Location: [facility] ENDOSCOPY;  Service: Endoscopy;  Laterality: N/A;  . ESOPHAGOGASTRODUODENOSCOPY (EGD) WITH PROPOFOL N/A 05/05/2017   Procedure: ESOPHAGOGASTRODUODENOSCOPY (EGD) WITH PROPOFOL;  Surgeon: Manya Silvas, MD;  Location: [facility] ENDOSCOPY;  Service: Endoscopy;  Laterality: N/A;  . ESOPHAGOGASTRODUODENOSCOPY (EGD) WITH PROPOFOL N/A 06/15/2017   Procedure: ESOPHAGOGASTRODUODENOSCOPY (EGD) WITH PROPOFOL;  Surgeon: [name], MD;  Location: [facility] ENDOSCOPY;  Service: Endoscopy;  Laterality: N/A;  . GIVENS CAPSULE STUDY N/A 05/07/2016   Procedure: GIVENS CAPSULE STUDY;  Surgeon: Doran Stabler, MD;  Location: [place];  Service: Endoscopy;  Laterality: N/A;  . LOWER EXTREMITY ANGIOGRAPHY Left 04/19/2018   Procedure: LOWER EXTREMITY ANGIOGRAPHY;  Surgeon: Algernon Huxley, MD;  Location: [place] CV LAB;  Service: Cardiovascular;  Laterality: Left;  [place] MANDIBULAR HARDWARE REMOVAL N/A 07/29/2013   Procedure: REMOVAL OF ARCH BARS;  Surgeon: Theodoro Kos, DO;  Location: [place];  Service: Plastics;  Laterality: N/A;    . ORIF MANDIBULAR FRACTURE N/A 06/05/2013   Procedure: REPAIR OF MANDIBULAR FRACTURE x 2 with maxillo-mandibular fixation ;  Surgeon: Theodoro Kos, DO;  Location: [place];  Service: Plastics;  Laterality: N/A;  . PARACENTESIS    . PERIPHERAL ARTERIAL STENT GRAFT Left    Patient Active Problem List   Diagnosis Date Noted  . PAD (peripheral artery disease) ([place]) 04/14/2018  . Healthcare-associated pneumonia 01/15/2018  . Acute on chronic respiratory failure ([place]) 01/15/2018  . Acute pulmonary edema ([place]) 12/23/2017  . Acute respiratory failure ([place]) 10/29/2017  . ESRD (end stage renal disease) on dialysis ([place]) 07/28/2017  . Protein-calorie malnutrition, severe 06/14/2017  . Encounter for dialysis [facility])   . Palliative care by specialist   . Goals of care, counseling/discussion   . Malnutrition of moderate degree 06/05/2017  . Secondary esophageal varices without bleeding ([place])   . Stomach irritation   . Idiopathic esophageal varices without bleeding ([place])   . Alcoholic hepatitis with ascites 05/24/2017  . ESRD (end stage renal disease) ([place]) 04/28/2017  . Uremia 03/08/2017  . ESRD on hemodialysis ([place]) 03/03/2017  . Weakness 02/28/2017  . Hypocalcemia 02/22/2017  . Shortness of breath 11/26/2016  . COPD (chronic obstructive pulmonary disease) ([place]) 10/30/2016  . COPD exacerbation ([place]) 10/29/2016  . Anemia   . Heme positive stool   . Ulceration of intestine   . Benign neoplasm  of transverse colon   . Acute gastrointestinal hemorrhage   . Esophageal candidiasis ([place])   . Angiodysplasia of intestinal tract   . Acute respiratory failure with hypoxia ([place]) 07/03/2016  . GI bleeding 06/24/2016  . Rectal bleeding 06/14/2016  . Anemia of chronic disease 06/01/2016  . MRSA carrier 06/01/2016  . Chronic renal failure 05/23/2016  . Ischemic heart disease 05/23/2016  . Angiodysplasia of small intestine   . Melena   . Small bowel bleed not requiring more than 4 units of blood in 24  hours, ICU, or surgery   . Anemia due to chronic blood loss   . Abdominal pain 05/05/2016  . Acute posthemorrhagic anemia 04/17/2016  . Gastrointestinal bleed 04/17/2016  . History of esophagogastroduodenoscopy (EGD) 04/17/2016  . Elevated troponin 04/17/2016  . Alcohol abuse 04/17/2016  . Upper GI bleed 01/19/2016  . Blood in stool   . Angiodysplasia of stomach and duodenum with hemorrhage   . Gastritis   . Reflux esophagitis   . GI bleed 05/16/2015  . Acute GI bleeding   . Symptomatic anemia 04/30/2015  . HTN (hypertension) 04/06/2015  . GERD (gastroesophageal reflux disease) 04/06/2015  . HLD (hyperlipidemia) 04/06/2015  . Dyspnea 04/06/2015  . Cirrhosis of liver with ascites ([place]) 04/06/2015  . Ascites 04/06/2015  . GIB (gastrointestinal bleeding) 03/23/2015  . Homicidal ideation 06/19/2013  . Suicidal intent 06/19/2013  . Homicidal ideations 06/19/2013  . Hyperkalemia 06/16/2013  . Mandible fracture ([place]) 06/05/2013  . Fracture, mandible ([place]) 06/02/2013  . Coronary atherosclerosis of native coronary artery 06/02/2013  . ESRD on dialysis ([place]) 06/02/2013  . Mandible open fracture ([place]) 06/02/2013      Prior to Admission medications   Medication Sig Start Date End Date Taking? Authorizing Provider  budesonide-formoterol (SYMBICORT) 160-4.5 MCG/ACT inhaler Inhale 2 puffs into the lungs daily. 11/16/16  Yes Vaughan Basta, MD  calcium acetate (PHOSLO) 667 MG capsule Take 2,001 mg by mouth 3 (three) times daily.   Yes [provider]  folic acid (FOLVITE) 1 MG tablet Take 1 tablet (1 mg total) by mouth daily. 10/31/17  Yes Fritzi Mandes, MD  gabapentin (NEURONTIN) 300 MG capsule Take 300 mg by mouth 3 (three) times daily.  08/16/16  Yes [provider]  ipratropium-albuterol (DUONEB) 0.5-2.5 (3) MG/3ML SOLN Take 3 mLs by nebulization every 6 (six) hours as needed. 11/28/16  Yes Wieting, Richard, MD  labetalol (NORMODYNE) 100 MG tablet Take 100 mg by mouth  daily.   Yes [provider]  multivitamin (RENA-VIT) TABS tablet Take 1 tablet by mouth at bedtime. 10/31/17  Yes Fritzi Mandes, MD  omeprazole (PRILOSEC) 40 MG capsule Take 40 mg by mouth daily. 09/08/17  Yes [provider]  tiotropium (SPIRIVA HANDIHALER) 18 MCG inhalation capsule Place 1 capsule (18 mcg total) into inhaler and inhale daily. 01/30/18 04/30/18 Yes Demetrios Loll, MD  aspirin EC 81 MG tablet Take 1 tablet (81 mg total) by mouth daily. 04/20/18 04/20/19  Epifanio Lesches, MD  clopidogrel (PLAVIX) 75 MG tablet Take 1 tablet (75 mg total) by mouth daily. 04/20/18 04/20/19  Epifanio Lesches, MD  oxyCODONE (OXY IR/ROXICODONE) 5 MG immediate release tablet Take 1 tablet (5 mg total) by mouth every 4 (four) hours as needed for moderate pain. 04/17/18   Epifanio Lesches, MD  vitamin C (VITAMIN C) 250 MG tablet Take 1 tablet (250 mg total) by mouth 2 (two) times daily. 04/20/18   Epifanio Lesches, MD    Allergies Patient has no known allergies.  Social History Social History   Tobacco Use  . Smoking status: Current Every Day Smoker    Packs/day: 0.15    Years: 40.00    Pack years: 6.00    Types: Cigarettes  . Smokeless tobacco: Never Used  Substance Use Topics  . Alcohol use: No    Comment: pt reports quitting after learning about cirrhosis  . Drug use: No    Frequency: 7.0 times per week    Types: Marijuana, Cocaine    Review of Systems Patient denies headaches, rhinorrhea, blurry vision, numbness, shortness of breath, chest pain, edema, cough, abdominal pain, nausea, vomiting, diarrhea, dysuria, fevers, rashes or hallucinations unless otherwise stated above in HPI. ____________________________________________   PHYSICAL EXAM:  VITAL SIGNS: Vitals:   04/19/18 2009 04/20/18 0411  BP: 108/79 121/83  Pulse: 79 78  Resp: 18 18  Temp: 98.7 F (37.1 C) 98.2 F (36.8 C)  SpO2: 97% 98%    Constitutional: Alert and oriented. Well appearing and in  no acute distress. Eyes: Conjunctivae are normal.  Head: Atraumatic. Nose: No congestion/rhinnorhea. Mouth/Throat: Mucous membranes are moist.   Neck: Painless ROM.  Cardiovascular:   Good peripheral circulation. Respiratory: Normal respiratory effort.  No retractions.  Gastrointestinal: Soft and nontender.  Musculoskeletal: No lower extremity tenderness .  No joint effusions. Neurologic:  Normal speech and language. No gross focal neurologic deficits are appreciated.  Skin:  Skin is warm, dry and intact. No rash noted. Psychiatric: Mood and affect are normal. Speech and behavior are normal.  ____________________________________________   LABS (all labs ordered are listed, but only abnormal results are displayed)  No results found for this or any previous visit (from the past 24 hour(s)). ____________________________________________ .ED ECG REPORT   Date: 04/14/2018  EKG Time: 15:50  Rate: 90  Rhythm: normal sinus rhythm,    Axis: normal  Intervals:normal intervals  ST&T Change: nonspecific st abn  Narrative Interpretation: abnml ekg             ____________________________________________   PROCEDURES  Procedure(s) performed:  Procedures    Critical Care performed: no ____________________________________________   INITIAL IMPRESSION / ASSESSMENT AND PLAN / ED COURSE  Pertinent labs & imaging results that were available during my care of the patient were reviewed by me and considered in my medical decision making (see chart for details).  DDX: Chronic end-stage renal disease on dialysis, volume overload, noncompliance  Stanley [name] is a 58 y.o. who presents to the ED with need for routine dialysis.  Patient stable and appropriate for admission to dialysis.  No indication for diagnostic testing at this time.  Clinical Course as of Apr 24 712  Sat Apr 14, 2018  1505 While being evaluated in dialysis he did speak with Dr. [name] and states that he has  been having worsening rest pain and leg pain of his left leg for the past 5 days.  On reevaluation now after having completed dialysis the patient does have cool lower extremity.  Able to move his toes so seems more subacute and seems to have some collateralization but has absent DP and PT Doppler signals with strong ones on the right certainly concerning for acute or subacute ischemia.  Based on his symptoms will be heparinized.  Will discuss case with hospitalist.  I did consult with Dr. Velda Shell of vascular surgery who agrees with heparinization admission to the hospital and they will continue to evaluate for additional management.   [PR]    Clinical Course User Index [  PR] Merlyn Lot, MD     ____________________________________________   FINAL CLINICAL IMPRESSION(S) / ED DIAGNOSES  Final diagnoses:  ESRD on dialysis [facility])  PAD (peripheral artery disease) ([place])      NEW MEDICATIONS STARTED DURING THIS VISIT:  Discharge Medication List as of 04/20/2018  9:52 AM    START taking these medications   Details  aspirin EC 81 MG tablet Take 1 tablet (81 mg total) by mouth daily., Starting Fri 04/20/2018, Until Sat 04/20/2019, Normal    clopidogrel (PLAVIX) 75 MG tablet Take 1 tablet (75 mg total) by mouth daily., Starting Fri 04/20/2018, Until Sat 04/20/2019, Normal    oxyCODONE (OXY IR/ROXICODONE) 5 MG immediate release tablet Take 1 tablet (5 mg total) by mouth every 4 (four) hours as needed for moderate pain., Starting Tue 04/17/2018, Print    vitamin C (VITAMIN C) 250 MG tablet Take 1 tablet (250 mg total) by mouth 2 (two) times daily., Starting Fri 04/20/2018, Normal         Note:  This document was prepared using Dragon voice recognition software and may include unintentional dictation errors.     Merlyn Lot, MD 04/14/18 1029    Merlyn Lot, MD 04/14/18 1513    Merlyn Lot, MD 04/24/18 [phone]

## 2018-04-14 NOTE — Progress Notes (Signed)
Pre HD Assessment    04/14/18 1040  Neurological  Level of Consciousness Alert  Orientation Level Oriented X4  Respiratory  Respiratory Pattern Regular;Unlabored  Chest Assessment Chest expansion symmetrical  Cough Non-productive  Cardiac  Pulse Regular  Heart Sounds S1, S2  ECG Monitor Yes  Vascular  R Radial Pulse +2  L Radial Pulse +2  Edema Generalized  Generalized Edema +2  Psychosocial  Psychosocial (WDL) WDL

## 2018-04-14 NOTE — Discharge Instructions (Signed)
Follow-up with PCP return for any questions or concerns.

## 2018-04-15 LAB — CBC
HEMATOCRIT: 38.5 % — AB (ref 39.0–52.0)
Hemoglobin: 11 g/dL — ABNORMAL LOW (ref 13.0–17.0)
MCH: 24.8 pg — ABNORMAL LOW (ref 26.0–34.0)
MCHC: 28.6 g/dL — ABNORMAL LOW (ref 30.0–36.0)
MCV: 86.9 fL (ref 80.0–100.0)
Platelets: 293 10*3/uL (ref 150–400)
RBC: 4.43 MIL/uL (ref 4.22–5.81)
RDW: 21.5 % — ABNORMAL HIGH (ref 11.5–15.5)
WBC: 6.9 10*3/uL (ref 4.0–10.5)
nRBC: 0 % (ref 0.0–0.2)

## 2018-04-15 LAB — HEPARIN LEVEL (UNFRACTIONATED)
Heparin Unfractionated: 0.11 IU/mL — ABNORMAL LOW (ref 0.30–0.70)
Heparin Unfractionated: 0.15 IU/mL — ABNORMAL LOW (ref 0.30–0.70)
Heparin Unfractionated: 0.32 IU/mL (ref 0.30–0.70)

## 2018-04-15 LAB — MRSA PCR SCREENING: MRSA by PCR: NEGATIVE

## 2018-04-15 MED ORDER — ALUM & MAG HYDROXIDE-SIMETH 200-200-20 MG/5ML PO SUSP
30.0000 mL | Freq: Four times a day (QID) | ORAL | Status: DC | PRN
  Administered 2018-04-15: 30 mL via ORAL
  Filled 2018-04-15: qty 30

## 2018-04-15 MED ORDER — HEPARIN BOLUS VIA INFUSION
2000.0000 [IU] | Freq: Once | INTRAVENOUS | Status: AC
  Administered 2018-04-15: 2000 [IU] via INTRAVENOUS
  Filled 2018-04-15: qty 2000

## 2018-04-15 MED ORDER — PANTOPRAZOLE SODIUM 40 MG PO TBEC
80.0000 mg | DELAYED_RELEASE_TABLET | Freq: Every day | ORAL | Status: DC
  Administered 2018-04-15 – 2018-04-20 (×6): 80 mg via ORAL
  Filled 2018-04-15 (×6): qty 2

## 2018-04-15 MED ORDER — PANTOPRAZOLE SODIUM 40 MG IV SOLR
40.0000 mg | Freq: Once | INTRAVENOUS | Status: AC
  Administered 2018-04-15: 40 mg via INTRAVENOUS
  Filled 2018-04-15: qty 40

## 2018-04-15 MED ORDER — ALUM & MAG HYDROXIDE-SIMETH 200-200-20 MG/5ML PO SUSP
30.0000 mL | ORAL | Status: DC | PRN
  Administered 2018-04-15: 30 mL via ORAL
  Filled 2018-04-15: qty 30

## 2018-04-15 NOTE — Progress Notes (Signed)
Called Dr. Posey Pronto regarding patient request for pain medication.  Appropriate orders were placed.  Christene Slates  04/15/2018  3:31 AM

## 2018-04-15 NOTE — Consult Note (Signed)
ANTICOAGULATION CONSULT NOTE - Initial Consult  Pharmacy Consult for Heparin Indication: DVT  No Known Allergies  Patient Measurements: Height: 6\' 3"  (190.5 cm) Weight: 144 lb 13.5 oz (65.7 kg) IBW/kg (Calculated) : 84.5 Heparin Dosing Weight: 68.9 kg  Vital Signs: Temp: 98.4 F (36.9 C) (02/01 1949) Temp Source: Oral (02/01 1949) BP: 122/80 (02/01 1949) Pulse Rate: 88 (02/01 1949)  Labs: Recent Labs    04/14/18 1534 04/15/18 0016  HGB 12.8*  --   HCT 45.0  --   PLT 302  --   APTT 35  --   LABPROT 13.2  --   INR 1.01  --   HEPARINUNFRC  --  0.11*  CREATININE 5.62*  5.80*  --     Estimated Creatinine Clearance: 13.3 mL/min (A) (by C-G formula based on SCr of 5.62 mg/dL (H)).   Medical History: Past Medical History:  Diagnosis Date  . Alcohol abuse   . CHF (congestive heart failure) ([place])   . Cirrhosis ([place])   . Coronary artery disease 2009  . Drug abuse ([place])   . End stage renal disease on dialysis [facility]) NEPHROLOGIST-   DR [facility]  IN [place]   HEMODIALYSIS --   TUES/  THURS/  SAT  . Gastrointestinal bleed 06/13/2017   From chart...hx of multiple GI bleeds  . GERD (gastroesophageal reflux disease)   . Hyperlipidemia   . Hypertension   . PAD (peripheral artery disease) ([place])   . Renal insufficiency    Per pt, 32 oz fluid restriction per day  . S/P triple vessel bypass 06/09/2016   2009ish  . Suicidal ideation    & HOMICIDAL IDEATION --  06-16-2013   ADMITTED TO BEHAVIOR HEALTH    Medications:  Patient not on any PTA anticoagulants.  Assessment: 58 yo male presented to ED for routine dialysis and was found to have DVT with pain in left leg x 5 days.   Goal of Therapy:  Heparin level 0.3-0.7 units/ml Monitor platelets by anticoagulation protocol: Yes   Plan:  2/2 @ 0016 HL 0.11. Level is subtherapeutic. Will order 2000 unit bolus and increase infusion to 1350 unit/hr. Recheck HL in 8 hours. CBC with AM labs per protocol.  Pharmacy will continue  to monitor and adjust as needed.   Pernell Dupre, PharmD, BCPS Clinical Pharmacist 04/15/2018 1:02 AM

## 2018-04-15 NOTE — Progress Notes (Signed)
[place], [place] 04/15/18  Subjective:   Patient admitted for left foot ischemia.  Placed on heparin infusion.  Vascular surgery evaluation is ongoing.  Objective:  Vital signs in last 24 hours:  Temp:  [98 F (36.7 C)-98.6 F (37 C)] 98.1 F (36.7 C) (02/02 1214) Pulse Rate:  [43-99] 80 (02/02 1214) Resp:  [12-20] 16 (02/02 1214) BP: (122-148)/(80-103) 145/89 (02/02 1214) SpO2:  [84 %-100 %] 95 % (02/02 1214) Weight:  [65.7 kg-78.6 kg] 78.6 kg (02/02 0730)  Weight change:  Filed Weights   04/14/18 1045 04/14/18 1430 04/15/18 0730  Weight: 68.9 kg 65.7 kg 78.6 kg    Intake/Output:    Intake/Output Summary (Last 24 hours) at 04/15/2018 1341 Last data filed at 04/15/2018 1013 Gross per 24 hour  Intake 327.09 ml  Output 3008 ml  Net -2680.91 ml     Physical Exam: General:  No acute distress, laying in the bed  HEENT  moist oral mucous membranes  Neck  supple  Pulm/lungs  normal breathing effort,   CVS/Heart  no rub  Abdomen:   Distended, ascites  Extremities:  2+ bilateral lower extremity edema  Neurologic:  Alert, able to answer questions  Skin:  No acute rashes  Access:  Right IJ PermCath       Basic Metabolic Panel:  Recent Labs  Lab 04/08/18 1622 04/09/18 1217 04/14/18 1534  NA 141 138 139  138  K 5.7* 6.6* 4.6  4.6  CL 93* 91* 89*  89*  CO2 38* 34* 38*  38*  GLUCOSE 93 86 81  82  BUN 39* 45* 15  15  CREATININE 9.15* 10.24* 5.62*  5.80*  CALCIUM 9.4 9.1 8.9  8.9  PHOS  --   --  4.9*     CBC: Recent Labs  Lab 04/08/18 1622 04/09/18 1217 04/14/18 1534 04/15/18 0937  WBC 6.2 6.2 6.9 6.9  NEUTROABS 4.4 4.2 4.7  --   HGB 11.1* 11.0* 12.8* 11.0*  HCT 40.0 38.0* 45.0 38.5*  MCV 89.7 87.2 87.9 86.9  PLT 229 239 302 293      Lab Results  Component Value Date   HEPBSAG Negative 06/16/2017   HEPBSAB Reactive 03/01/2017   HEPBIGM Negative 06/01/2016      Microbiology:  Recent Results (from the  past 240 hour(s))  MRSA PCR Screening     Status: None   Collection Time: 04/15/18  1:38 AM  Result Value Ref Range Status   MRSA by PCR NEGATIVE NEGATIVE Final    Comment:        The GeneXpert MRSA Assay (FDA approved for NASAL specimens only), is one component of a comprehensive MRSA colonization surveillance program. It is not intended to diagnose MRSA infection nor to guide or monitor treatment for MRSA infections. Performed at [facility], [address] [place], [place] [address]     Coagulation Studies: Recent Labs    04/14/18 1534  LABPROT 13.2  INR 1.01    Urinalysis: No results for input(s): COLORURINE, LABSPEC, PHURINE, GLUCOSEU, HGBUR, BILIRUBINUR, KETONESUR, PROTEINUR, UROBILINOGEN, NITRITE, LEUKOCYTESUR in the last 72 hours.  Invalid input(s): APPERANCEUR    Imaging: No results found.   Medications:   . heparin 1,550 Units/hr (04/15/18 1114)   . calcium acetate  2,001 mg Oral TID  . calcium carbonate  1 tablet Oral BID  . Chlorhexidine Gluconate Cloth  6 each Topical Q0600  . mometasone-formoterol  2 puff Inhalation BID  . multivitamin  1  tablet Oral QHS  . pantoprazole  80 mg Oral Daily  . tiotropium  18 mcg Inhalation Daily     Assessment/ Plan:  58 y.o. male withend stage renal disease on hemodialysis secondary to Al[place]'s syndrome, ascites, hypertension, anemia of chronic kidney disease, coronary artery disease, peripheral vascular disease, hyperlipidemia, gastrointestinal AVMs, pulmonary hypertension  [facility] TTS RIJ permcath. 210 min/  1.  Lower extremity edema/volume overload 2.  End-stage renal disease 3.  Ascites 4.  Hyperkalemia 5.  Anemia of chronic kidney disease 6.  Peripheral vascular disease, left foot ischemia  Urgent hemodialysis for volume overload on Saturday.  3 L of fluid was removed. Hgb 11. Continue to monitor Vascular surgery evaluation ongoing for left foot ischemia.  Currently  anticoagulated with heparin infusion. Continue home dose of binders. Next hemodialysis expected on Tuesday   LOS: [place] 2/2/20201:41 PM  [place], [place]  Note: This note was prepared with Dragon dictation. Any transcription errors are unintentional

## 2018-04-15 NOTE — Consult Note (Signed)
ANTICOAGULATION CONSULT NOTE - Follow up [place] for Heparin Indication: DVT  No Known Allergies  Patient Measurements: Height: 6\' 3"  (190.5 cm) Weight: 173 lb 3.2 oz (78.6 kg) IBW/kg (Calculated) : 84.5 Heparin Dosing Weight: 68.9 kg > 65.7 kg  Addendum RN called with updated weight of 78.6 kg (better matches weight earlier this month) New hep weight = 78.6 kg   Vital Signs: Temp: 98.5 F (36.9 C) (02/02 2025) Temp Source: Oral (02/02 2025) BP: 138/93 (02/02 2025) Pulse Rate: 89 (02/02 2025)  Labs: Recent Labs    04/14/18 1534 04/15/18 0016 04/15/18 0937 04/15/18 1951  HGB 12.8*  --  11.0*  --   HCT 45.0  --  38.5*  --   PLT 302  --  293  --   APTT 35  --   --   --   LABPROT 13.2  --   --   --   INR 1.01  --   --   --   HEPARINUNFRC  --  0.11* 0.15* 0.32  CREATININE 5.62*  5.80*  --   --   --     Estimated Creatinine Clearance: 15.9 mL/min (A) (by C-G formula based on SCr of 5.62 mg/dL (H)).   Medications:  Patient not on any PTA anticoagulants.  Assessment: 58 yo male presented to ED after HD with left leg pain and left foot ischemia.   2/1 - heparin 4000 unit bolus, followed by 1150 unit/hr continuous infusion 2/2 @ 0016 HL 0.11 - ordered 2000 unit bolus and increased infusion to 1350 unit/hr. 2/2 @ 0937 HL 0.15 -  ordered heparin 2000 unit IV bolus and increased infusion to 1550 unit/hr.  Goal of Therapy:  Heparin level 0.3-0.7 units/ml Monitor platelets by anticoagulation protocol: Yes   Plan:  2/2 @ 1951 Will continue current rate 1550units/hr and  Will Recheck HL in 8 hours.  CBC with AM labs per protocol.   Pharmacy will continue to monitor and adjust as needed.   Lu Duffel, PharmD, BCPS Clinical Pharmacist 04/15/2018 9:14 PM

## 2018-04-15 NOTE — Progress Notes (Addendum)
[place] at [place] NAME: Stanley [name]    MR#:  [id number]  DATE OF BIRTH:  10-30-1960  SUBJECTIVE: Admitted for left leg pain, found to cool left foot, started on heparin, admitted for  limb ischemia.  CHIEF COMPLAINT:   Chief Complaint  Patient presents with  . needs dialysis    REVIEW OF SYSTEMS:   ROS CONSTITUTIONAL: No fever, fatigue or weakness.  EYES: No blurred or double vision.  EARS, NOSE, AND THROAT: No tinnitus or ear pain.  RESPIRATORY: No cough, shortness of breath, wheezing or hemoptysis.  CARDIOVASCULAR: No chest pain, orthopnea, edema.  GASTROINTESTINAL: No nausea, vomiting, diarrhea or abdominal pain.  GENITOURINARY: No dysuria, hematuria.  ENDOCRINE: No polyuria, nocturia,  HEMATOLOGY: No anemia, easy bruising or bleeding SKIN: No rash or lesion. MUSCULOSKELETAL: Left leg pain nEUROLOGIC: No tingling, numbness, weakness.  PSYCHIATRY: No anxiety or depression.   DRUG ALLERGIES:  No Known Allergies  VITALS:  Blood pressure 130/88, pulse 78, temperature 98.4 F (36.9 C), temperature source Oral, resp. rate 16, height 6\' 3"  (1.905 m), weight 65.7 kg, SpO2 93 %.  PHYSICAL EXAMINATION:  GENERAL:  58 y.o.-year-old patient lying in the bed with no acute distress.  EYES: Pupils equal, round, reactive to light and accommodation. No scleral icterus. Extraocular muscles intact.  HEENT: Head atraumatic, normocephalic. Oropharynx and nasopharynx clear.  NECK:  Supple, no jugular venous distention. No thyroid enlargement, no tenderness.  LUNGS: Normal breath sounds bilaterally, no wheezing, rales,rhonchi or crepitation. No use of accessory muscles of respiration.  CARDIOVASCULAR: S1, S2 normal. No murmurs, rubs, or gallops.  ABDOMEN: Soft, nontender, nondistended. Bowel sounds present. No organomegaly or mass.  EXTREMITIES: No pedal edema, cyanosis, or clubbing.  NEUROLOGIC: Cranial nerves II through XII are  intact. Muscle strength 5/5 in all extremities.  Foot is slightly cool to touch than the right foot.  PSYCHIATRIC: The patient is alert and oriented x 3.  SKIN: No obvious rash, lesion, or ulcer.    LABORATORY PANEL:   CBC Recent Labs  Lab 04/15/18 0937  WBC 6.9  HGB 11.0*  HCT 38.5*  PLT 293   ------------------------------------------------------------------------------------------------------------------  Chemistries  Recent Labs  Lab 04/09/18 1217 04/14/18 1534  NA 138 139  138  K 6.6* 4.6  4.6  CL 91* 89*  89*  CO2 34* 38*  38*  GLUCOSE 86 81  82  BUN 45* 15  15  CREATININE 10.24* 5.62*  5.80*  CALCIUM 9.1 8.9  8.9  AST 31  --   ALT 13  --   ALKPHOS 124  --   BILITOT 1.3*  --    ------------------------------------------------------------------------------------------------------------------  Cardiac Enzymes Recent Labs  Lab 04/08/18 1622  TROPONINI 0.07*   ------------------------------------------------------------------------------------------------------------------  RADIOLOGY:  No results found.  EKG:   Orders placed or performed during the hospital encounter of 04/14/18  . ED EKG  . ED EKG    ASSESSMENT AND PLAN:   #1 PAD, left lower limb ischemia, started on heparin drip, vascular surgery is following, patient needs angiogram of lef leg,continue heparin drip  2.  History of PAD, Essential hypertension: Controlled. 4.  Polysubstance abuse 5.  GERD: Continue PPI 6.  History of ESRD hemodialysis Monday, Wednesday, Friday. More than 50% time spent in counseling, coordination of care  All the records are reviewed and case discussed with Care Management/Social Workerr. Management plans discussed with the patient, family and they are in agreemen CODE STATUS: Full code  TOTAL  TIME TAKING CARE OF THIS PATIENT: 38 minutes.   POSSIBLE D/C IN1- 2 DAYS, DEPENDING ON CLINICAL CONDITION.   Epifanio Lesches M.D on 04/15/2018 at 12:09  PM  Between 7am to 6pm - Pager - [phone]  After 6pm go to [email or web address] - password [email or web address] [facility]  Office  [phone]  CC: Primary care physician; [name], MD   Note: This dictation was prepared with Dragon dictation along with smaller phrase technology. Any transcriptional errors that result from this process are unintentional.

## 2018-04-15 NOTE — Progress Notes (Signed)
Patient ID: Stanley [name], male   DOB: 1961/03/10, 58 y.o.   MRN: [id number] Patient is seen and examined this morning.  Patient does not complain of any rest pain and is resting both of his legs on the bed and sitting up.  He is accompanied by the nurse and lady friend.  Local examination is unremarkable and unchanged from prior exam.  Patient will be reevaluated tomorrow for possible angiography clinically relevant.

## 2018-04-15 NOTE — Consult Note (Addendum)
ANTICOAGULATION CONSULT NOTE - Follow up [place] for Heparin Indication: DVT  No Known Allergies  Patient Measurements: Height: 6\' 3"  (190.5 cm) Weight: 144 lb 13.5 oz (65.7 kg) IBW/kg (Calculated) : 84.5 Heparin Dosing Weight: 68.9 kg > 65.7 kg  Vital Signs: Temp: 98.4 F (36.9 C) (02/02 0452) Temp Source: Oral (02/02 0452) BP: 130/88 (02/02 0452) Pulse Rate: 78 (02/02 0452)  Labs: Recent Labs    04/14/18 1534 04/15/18 0016 04/15/18 0937  HGB 12.8*  --  11.0*  HCT 45.0  --  38.5*  PLT 302  --  293  APTT 35  --   --   LABPROT 13.2  --   --   INR 1.01  --   --   HEPARINUNFRC  --  0.11* 0.15*  CREATININE 5.62*  5.80*  --   --     Estimated Creatinine Clearance: 13.3 mL/min (A) (by C-G formula based on SCr of 5.62 mg/dL (H)).   Medications:  Patient not on any PTA anticoagulants.  Assessment: 58 yo male presented to ED after HD with left leg pain and left foot ischemia.   2/1 - heparin 4000 unit bolus, followed by 1150 unit/hr continuous infusion 2/2 @ 0016 HL 0.11. Level is subtherapeutic. Will order 2000 unit bolus and increase infusion to 1350 unit/hr.   Goal of Therapy:  Heparin level 0.3-0.7 units/ml Monitor platelets by anticoagulation protocol: Yes   Plan:  2/2 @ 0937 HL 0.15. Level is subtherapeutic. RN confirms heparin running at 13.5 ml/hr; RN states no line issues and no s/sx of bleeding.  Will order heparin 2000 unit IV bolus and increase infusion to 1550 unit/hr. Recheck HL in 8 hours. CBC with AM labs per protocol.   Also asked RN to re-weigh the patient as pt weighed 174 lb on 03/27/18; large changes in weight may affect heparin dosing.   Pharmacy will continue to monitor and adjust as needed.   Rocky Morel, PharmD, BCPS Clinical Pharmacist 04/15/2018 10:33 AM   Addendum RN called with updated weight of 78.6 kg (better matches weight earlier this month) New hep weight = 78.6 kg   Rayna Sexton, PharmD, BCPS Clinical  Pharmacist 04/15/2018 12:36 PM

## 2018-04-16 LAB — CBC
HCT: 41 % (ref 39.0–52.0)
Hemoglobin: 11.6 g/dL — ABNORMAL LOW (ref 13.0–17.0)
MCH: 24.6 pg — ABNORMAL LOW (ref 26.0–34.0)
MCHC: 28.3 g/dL — ABNORMAL LOW (ref 30.0–36.0)
MCV: 87 fL (ref 80.0–100.0)
PLATELETS: 316 10*3/uL (ref 150–400)
RBC: 4.71 MIL/uL (ref 4.22–5.81)
RDW: 21.3 % — ABNORMAL HIGH (ref 11.5–15.5)
WBC: 6.6 10*3/uL (ref 4.0–10.5)
nRBC: 0 % (ref 0.0–0.2)

## 2018-04-16 LAB — HEPARIN LEVEL (UNFRACTIONATED)
Heparin Unfractionated: 0.29 IU/mL — ABNORMAL LOW (ref 0.30–0.70)
Heparin Unfractionated: 0.37 IU/mL (ref 0.30–0.70)
Heparin Unfractionated: 0.3 IU/mL (ref 0.30–0.70)

## 2018-04-16 MED ORDER — CALCIUM ACETATE (PHOS BINDER) 667 MG PO CAPS
2001.0000 mg | ORAL_CAPSULE | Freq: Three times a day (TID) | ORAL | Status: DC
  Administered 2018-04-17 – 2018-04-18 (×5): 2001 mg via ORAL
  Filled 2018-04-16 (×7): qty 3

## 2018-04-16 NOTE — Progress Notes (Signed)
[place] at [place] NAME: Stanley [name]    MR#:  [id number]  DATE OF BIRTH:  04-Oct-1960  SUBJECTIVE: Continue heparin drip, continues to have left leg pain but better than before.  Requesting angiogram by vascular and asking when he is going to have the procedure.  CHIEF COMPLAINT:   Chief Complaint  Patient presents with  . needs dialysis    REVIEW OF SYSTEMS:   ROS CONSTITUTIONAL: No fever, fatigue or weakness.  EYES: No blurred or double vision.  EARS, NOSE, AND THROAT: No tinnitus or ear pain.  RESPIRATORY: No cough, shortness of breath, wheezing or hemoptysis.  CARDIOVASCULAR: No chest pain, orthopnea, edema.  GASTROINTESTINAL: No nausea, vomiting, diarrhea or abdominal pain.  GENITOURINARY: No dysuria, hematuria.  ENDOCRINE: No polyuria, nocturia,  HEMATOLOGY: No anemia, easy bruising or bleeding SKIN: No rash or lesion. MUSCULOSKELETAL: Left leg pain nEUROLOGIC: No tingling, numbness, weakness.  PSYCHIATRY: No anxiety or depression.   DRUG ALLERGIES:  No Known Allergies  VITALS:  Blood pressure (!) 141/96, pulse 91, temperature 98.4 F (36.9 C), temperature source Oral, resp. rate 20, height 6\' 3"  (1.905 m), weight 78.6 kg, SpO2 91 %.  PHYSICAL EXAMINATION:  GENERAL:  58 y.o.-year-old patient lying in the bed with no acute distress.  EYES: Pupils equal, round, reactive to light and accommodation. No scleral icterus. Extraocular muscles intact.  HEENT: Head atraumatic, normocephalic. Oropharynx and nasopharynx clear.  NECK:  Supple, no jugular venous distention. No thyroid enlargement, no tenderness.  LUNGS: Normal breath sounds bilaterally, no wheezing, rales,rhonchi or crepitation. No use of accessory muscles of respiration.  CARDIOVASCULAR: S1, S2 normal. No murmurs, rubs, or gallops.  ABDOMEN: Soft, nontender, nondistended. Bowel sounds present. No organomegaly or mass.  EXTREMITIES: No pedal edema, cyanosis,  or clubbing.  Left leg warm to touch but left foot slightly cold, patient noted to have weak pulse in the right side, nonpalpable   dp on the left side.   NEUROLOGIC: Cranial nerves II through XII are intact. Muscle strength 5/5 in all extremities.  Foot is slightly cool to touch than the right foot.  PSYCHIATRIC: The patient is alert and oriented x 3.  SKIN: No obvious rash, lesion, or ulcer.    LABORATORY PANEL:   CBC Recent Labs  Lab 04/16/18 0401  WBC 6.6  HGB 11.6*  HCT 41.0  PLT 316   ------------------------------------------------------------------------------------------------------------------  Chemistries  Recent Labs  Lab 04/14/18 1534  NA 139  138  K 4.6  4.6  CL 89*  89*  CO2 38*  38*  GLUCOSE 81  82  BUN 15  15  CREATININE 5.62*  5.80*  CALCIUM 8.9  8.9   ------------------------------------------------------------------------------------------------------------------  Cardiac Enzymes No results for input(s): TROPONINI in the last 168 hours. ------------------------------------------------------------------------------------------------------------------  RADIOLOGY:  No results found.  EKG:   Orders placed or performed during the hospital encounter of 04/14/18  . ED EKG  . ED EKG    ASSESSMENT AND PLAN:   #1. PAD, left lower limb ischemia, heparin drip, patient may require higher program with runoff to left leg with possible endovascular intervention, appreciate vascular surgery following.  Continue to follow left lower extremity Doppler signals, neurochecks.  Continues to request morphine, patient threatening staff this morning saying he is going to leave if he is not going to have vascular procedure for the left leg.  Patient has narcotic seeking.  From previous admissions. 2.  History of PAD, Essential hypertension: Controlled. 4.  Polysubstance abuse 5.  GERD: Continue PPI 6.  History of ESRD hemodialysis Monday, Wednesday,  Friday.   More than 50% time spent in counseling, coordination of care  All the records are reviewed and case discussed with Care Management/Social Workerr. Management plans discussed with the patient, family and they are in agreemen CODE STATUS: Full code  TOTAL TIME TAKING CARE OF THIS PATIENT: 38 minutes.   POSSIBLE D/C IN1- 2 DAYS, DEPENDING ON CLINICAL CONDITION.   Epifanio Lesches M.D on 04/16/2018 at 1:28 PM  Between 7am to 6pm - Pager - [phone]  After 6pm go to [email or web address] - password [email or web address] [facility]  Office  [phone]  CC: Primary care physician; [name], MD   Note: This dictation was prepared with Dragon dictation along with smaller phrase technology. Any transcriptional errors that result from this process are unintentional.

## 2018-04-16 NOTE — Consult Note (Signed)
ANTICOAGULATION CONSULT NOTE - Follow up [place] for Heparin Indication: DVT  No Known Allergies  Patient Measurements: Height: 6\' 3"  (190.5 cm) Weight: 173 lb 3.2 oz (78.6 kg) IBW/kg (Calculated) : 84.5 Heparin Dosing Weight: 68.9 kg > 65.7 kg  Addendum RN called with updated weight of 78.6 kg (better matches weight earlier this month) New hep weight = 78.6 kg   Vital Signs: Temp: 98.4 F (36.9 C) (02/03 0517) Temp Source: Oral (02/03 0517) BP: 134/87 (02/03 0517) Pulse Rate: 79 (02/03 0517)  Labs: Recent Labs    04/14/18 1534  04/15/18 0937 04/15/18 1951 04/16/18 0401  HGB 12.8*  --  11.0*  --  11.6*  HCT 45.0  --  38.5*  --  41.0  PLT 302  --  293  --  316  APTT 35  --   --   --   --   LABPROT 13.2  --   --   --   --   INR 1.01  --   --   --   --   HEPARINUNFRC  --    < > 0.15* 0.32 0.29*  CREATININE 5.62*  5.80*  --   --   --   --    < > = values in this interval not displayed.    Estimated Creatinine Clearance: 15.9 mL/min (A) (by C-G formula based on SCr of 5.62 mg/dL (H)).   Medications:  Patient not on any PTA anticoagulants.  Assessment: 58 yo male presented to ED after HD with left leg pain and left foot ischemia. Patient has not yet been evaluated by vascular surgery today, although they note plans to do an angiogram. Hgb is down slightly from baseline but stable with no noted bleeding.  Heparin Course 2/1 Initiation: 4000 unit bolus, followed by 1150 unit/hr  2/2 0016 HL 0.11: 2000 unit bolus, inc to 1350 unit/hr. 2/2 0937 HL 0.15: 2000 unit bolus, inc to 1550 unit/hr. 2/2 1951 HL 0.32 2/3 0401 HL 0.29: inc to 1700 units/hr 2/3 1327 HL 0.37  Goal of Therapy:  Heparin level 0.3-0.7 units/ml Monitor platelets by anticoagulation protocol: Yes   Plan:  First therapeutic level has returned following last rate adjustment. Will recheck HL in 8 hours. If second level is therapeutic we will move to once daily levels in the  mornings.   CBC and HL with AM labs per protocol.   Pharmacy will continue to monitor and adjust as needed.   Dallie Piles, PharmD Clinical Pharmacist 04/16/2018 7:30 AM

## 2018-04-16 NOTE — Progress Notes (Signed)
Pt c/o pain. RN and student brought pt morphine IV per request. Pt stated that "I'm leaving if we don't get this procedure done soon". Pt educated that he was welcome to leave AMA if he chose, however, RN could not administer IV medication if he was going to do that and it was not safe for him to leave after getting IV medication. Pt stated "they need to hurry up". Pt educated on plan of care. Pt getting agitated stated "I don't care. That's not what they told me yesterday". Pt educated on plan of care again and option of AMA. Pt asked by this RN if he was going to stay or leave. Pt responded "uh huh". Pt asked by RN what that mean, if he was going to stay or go. Pt responded "uh huh". Pt again asked by RN to clarify as RN does not understand the response. Pt stated "yes I'll stay". Pt given IV medication per request at this point.

## 2018-04-16 NOTE — Progress Notes (Signed)
[place], [place] 04/16/18  Subjective:  Patient seen at bedside. Due for dialysis again tomorrow. Still on a heparin drip.  Objective:  Vital signs in last 24 hours:  Temp:  [98.4 F (36.9 C)-98.5 F (36.9 C)] 98.4 F (36.9 C) (02/03 1246) Pulse Rate:  [79-91] 84 (02/03 1300) Resp:  [16-20] 20 (02/03 1246) BP: (129-141)/(74-96) 129/74 (02/03 1300) SpO2:  [91 %-93 %] 91 % (02/03 1246)  Weight change: 9.663 kg Filed Weights   04/14/18 1045 04/14/18 1430 04/15/18 0730  Weight: 68.9 kg 65.7 kg 78.6 kg    Intake/Output:    Intake/Output Summary (Last 24 hours) at 04/16/2018 1431 Last data filed at 04/16/2018 0800 Gross per 24 hour  Intake 790.93 ml  Output 350 ml  Net 440.93 ml     Physical Exam: General:  No acute distress, laying in the bed  HEENT  moist oral mucous membranes  Neck  supple  Pulm/lungs  normal breathing effort,   CVS/Heart  no rub  Abdomen:   Distended, ascites  Extremities:  1+ bilateral lower extremity edema  Neurologic:  Alert, able to answer questions  Skin:  No acute rashes  Access:  Right IJ PermCath       Basic Metabolic Panel:  Recent Labs  Lab 04/14/18 1534  NA 139  138  K 4.6  4.6  CL 89*  89*  CO2 38*  38*  GLUCOSE 81  82  BUN 15  15  CREATININE 5.62*  5.80*  CALCIUM 8.9  8.9  PHOS 4.9*     CBC: Recent Labs  Lab 04/14/18 1534 04/15/18 0937 04/16/18 0401  WBC 6.9 6.9 6.6  NEUTROABS 4.7  --   --   HGB 12.8* 11.0* 11.6*  HCT 45.0 38.5* 41.0  MCV 87.9 86.9 87.0  PLT 302 293 316      Lab Results  Component Value Date   HEPBSAG Negative 06/16/2017   HEPBSAB Reactive 03/01/2017   HEPBIGM Negative 06/01/2016      Microbiology:  Recent Results (from the past 240 hour(s))  MRSA PCR Screening     Status: None   Collection Time: 04/15/18  1:38 AM  Result Value Ref Range Status   MRSA by PCR NEGATIVE NEGATIVE Final    Comment:        The GeneXpert MRSA Assay  (FDA approved for NASAL specimens only), is one component of a comprehensive MRSA colonization surveillance program. It is not intended to diagnose MRSA infection nor to guide or monitor treatment for MRSA infections. Performed at [facility], [address] [place],  [address]     Coagulation Studies: Recent Labs    04/14/18 1534  LABPROT 13.2  INR 1.01    Urinalysis: No results for input(s): COLORURINE, LABSPEC, PHURINE, GLUCOSEU, HGBUR, BILIRUBINUR, KETONESUR, PROTEINUR, UROBILINOGEN, NITRITE, LEUKOCYTESUR in the last 72 hours.  Invalid input(s): APPERANCEUR    Imaging: No results found.   Medications:   . heparin 1,700 Units/hr (04/16/18 1347)   . calcium acetate  2,001 mg Oral TID  . calcium carbonate  1 tablet Oral BID  . Chlorhexidine Gluconate Cloth  6 each Topical Q0600  . mometasone-formoterol  2 puff Inhalation BID  . multivitamin  1 tablet Oral QHS  . pantoprazole  80 mg Oral Daily  . tiotropium  18 mcg Inhalation Daily     Assessment/ Plan:  58 y.o. male withend stage renal disease on hemodialysis secondary to Alport's syndrome, ascites, hypertension, anemia of chronic  kidney disease, coronary artery disease, peripheral vascular disease, hyperlipidemia, gastrointestinal AVMs, pulmonary hypertension  [facility] TTS RIJ permcath. 210 min/  1.  Lower extremity edema/volume overload 2.  End-stage renal disease 3.  Ascites 4.  Hyperkalemia 5.  Anemia of chronic kidney disease 6.  Peripheral vascular disease, left foot ischemia  -Patient underwent dialysis urgently on Saturday for volume overload.  No signs of volume overload at the moment.  We will plan for dialysis again tomorrow per his usual schedule.  No indication for Procrit at the moment as most recent hemoglobin was 11.6.  Follow-up serum phosphorus tomorrow as well.   LOS: 2 Stanley [name] 2/3/20202:31 PM  [place],  [place]  Note: This note was prepared with Dragon dictation. Any transcription errors are unintentional

## 2018-04-16 NOTE — Consult Note (Signed)
ANTICOAGULATION CONSULT NOTE - Follow up [place] for Heparin Indication: DVT  No Known Allergies  Patient Measurements: Height: 6\' 3"  (190.5 cm) Weight: 173 lb 3.2 oz (78.6 kg) IBW/kg (Calculated) : 84.5 Heparin Dosing Weight: 68.9 kg > 65.7 kg  Addendum RN called with updated weight of 78.6 kg (better matches weight earlier this month) New hep weight = 78.6 kg   Vital Signs: Temp: 97.4 F (36.3 C) (02/03 1927) Temp Source: Oral (02/03 1927) BP: 131/90 (02/03 1931) Pulse Rate: 83 (02/03 1931)  Labs: Recent Labs    04/14/18 1534  04/15/18 0937  04/16/18 0401 04/16/18 1327 04/16/18 2150  HGB 12.8*  --  11.0*  --  11.6*  --   --   HCT 45.0  --  38.5*  --  41.0  --   --   PLT 302  --  293  --  316  --   --   APTT 35  --   --   --   --   --   --   LABPROT 13.2  --   --   --   --   --   --   INR 1.01  --   --   --   --   --   --   HEPARINUNFRC  --    < > 0.15*   < > 0.29* 0.37 0.30  CREATININE 5.62*  5.80*  --   --   --   --   --   --    < > = values in this interval not displayed.    Estimated Creatinine Clearance: 15.9 mL/min (A) (by C-G formula based on SCr of 5.62 mg/dL (H)).   Medications:  Patient not on any PTA anticoagulants.  Assessment: 58 yo male presented to ED after HD with left leg pain and left foot ischemia. Patient has not yet been evaluated by vascular surgery today, although they note plans to do an angiogram. Hgb is down slightly from baseline but stable with no noted bleeding.  Heparin Course 2/1 Initiation: 4000 unit bolus, followed by 1150 unit/hr  2/2 0016 HL 0.11: 2000 unit bolus, inc to 1350 unit/hr. 2/2 0937 HL 0.15: 2000 unit bolus, inc to 1550 unit/hr. 2/2 1951 HL 0.32 2/3 0401 HL 0.29: inc to 1700 units/hr 2/3 1327 HL 0.37  Goal of Therapy:  Heparin level 0.3-0.7 units/ml Monitor platelets by anticoagulation protocol: Yes   Plan:  First therapeutic level has returned following last rate adjustment. Will  recheck HL in 8 hours. If second level is therapeutic we will move to once daily levels in the mornings.   CBC and HL with AM labs per protocol.   2/3 PM heparin level 0.30. Continue current regimen. Recheck heparin level and CBC with tomorrow AM labs.  Pharmacy will continue to monitor and adjust as needed.   Eloise Harman, PharmD Clinical Pharmacist 04/16/2018 10:55 PM

## 2018-04-16 NOTE — Consult Note (Addendum)
ANTICOAGULATION CONSULT NOTE - Follow up [place] for Heparin Indication: DVT  No Known Allergies  Patient Measurements: Height: 6\' 3"  (190.5 cm) Weight: 173 lb 3.2 oz (78.6 kg) IBW/kg (Calculated) : 84.5 Heparin Dosing Weight: 68.9 kg > 65.7 kg  Addendum RN called with updated weight of 78.6 kg (better matches weight earlier this month) New hep weight = 78.6 kg   Vital Signs: Temp: 98.5 F (36.9 C) (02/02 2025) Temp Source: Oral (02/02 2025) BP: 138/93 (02/02 2025) Pulse Rate: 89 (02/02 2025)  Labs: Recent Labs    04/14/18 1534  04/15/18 0937 04/15/18 1951 04/16/18 0401  HGB 12.8*  --  11.0*  --  11.6*  HCT 45.0  --  38.5*  --  41.0  PLT 302  --  293  --  316  APTT 35  --   --   --   --   LABPROT 13.2  --   --   --   --   INR 1.01  --   --   --   --   HEPARINUNFRC  --    < > 0.15* 0.32 0.29*  CREATININE 5.62*  5.80*  --   --   --   --    < > = values in this interval not displayed.    Estimated Creatinine Clearance: 15.9 mL/min (A) (by C-G formula based on SCr of 5.62 mg/dL (H)).   Medications:  Patient not on any PTA anticoagulants.  Assessment: 58 yo male presented to ED after HD with left leg pain and left foot ischemia.   2/1 - heparin 4000 unit bolus, followed by 1150 unit/hr continuous infusion 2/2 @ 0016 HL 0.11 - ordered 2000 unit bolus and increased infusion to 1350 unit/hr. 2/2 @ 0937 HL 0.15 -  ordered heparin 2000 unit IV bolus and increased infusion to 1550 unit/hr. 2.2 @ 1951 HL 0.32. Continued 1550 units/hr  Goal of Therapy:  Heparin level 0.3-0.7 units/ml Monitor platelets by anticoagulation protocol: Yes   Plan:  2/3 @ 0401 HL: 0.29. Level is subtherapeutic. Will increase infusion rate to 1700 units/hr and  Recheck HL in 8 hours.   CBC and HL with AM labs per protocol.   Pharmacy will continue to monitor and adjust as needed.   Pernell Dupre, PharmD, BCPS Clinical Pharmacist 04/16/2018 4:47 AM

## 2018-04-17 ENCOUNTER — Telehealth (INDEPENDENT_AMBULATORY_CARE_PROVIDER_SITE_OTHER): Payer: Self-pay

## 2018-04-17 DIAGNOSIS — N186 End stage renal disease: Secondary | ICD-10-CM

## 2018-04-17 DIAGNOSIS — I739 Peripheral vascular disease, unspecified: Secondary | ICD-10-CM

## 2018-04-17 DIAGNOSIS — E785 Hyperlipidemia, unspecified: Secondary | ICD-10-CM

## 2018-04-17 DIAGNOSIS — I120 Hypertensive chronic kidney disease with stage 5 chronic kidney disease or end stage renal disease: Secondary | ICD-10-CM

## 2018-04-17 DIAGNOSIS — Z992 Dependence on renal dialysis: Secondary | ICD-10-CM

## 2018-04-17 LAB — CBC
HEMATOCRIT: 39.1 % (ref 39.0–52.0)
HEMOGLOBIN: 11.1 g/dL — AB (ref 13.0–17.0)
MCH: 25.1 pg — ABNORMAL LOW (ref 26.0–34.0)
MCHC: 28.4 g/dL — ABNORMAL LOW (ref 30.0–36.0)
MCV: 88.5 fL (ref 80.0–100.0)
Platelets: 311 10*3/uL (ref 150–400)
RBC: 4.42 MIL/uL (ref 4.22–5.81)
RDW: 21.2 % — ABNORMAL HIGH (ref 11.5–15.5)
WBC: 7 10*3/uL (ref 4.0–10.5)
nRBC: 0 % (ref 0.0–0.2)

## 2018-04-17 LAB — PHOSPHORUS: Phosphorus: 6.7 mg/dL — ABNORMAL HIGH (ref 2.5–4.6)

## 2018-04-17 LAB — HEPARIN LEVEL (UNFRACTIONATED): Heparin Unfractionated: 0.32 IU/mL (ref 0.30–0.70)

## 2018-04-17 MED ORDER — OXYCODONE HCL 5 MG PO TABS: 5.0000 mg | ORAL_TABLET | ORAL | 0 refills | Status: DC | PRN

## 2018-04-17 MED ORDER — OXYCODONE HCL 5 MG PO TABS
10.0000 mg | ORAL_TABLET | ORAL | Status: DC | PRN
  Administered 2018-04-17 – 2018-04-19 (×7): 10 mg via ORAL
  Filled 2018-04-17 (×7): qty 2

## 2018-04-17 MED ORDER — MORPHINE SULFATE (PF) 2 MG/ML IV SOLN
2.0000 mg | INTRAVENOUS | Status: DC | PRN
  Administered 2018-04-17 – 2018-04-20 (×8): 2 mg via INTRAVENOUS
  Filled 2018-04-17 (×8): qty 1

## 2018-04-17 NOTE — Progress Notes (Signed)
[facility] NAME: Stanley [name]    MR#:  [id number]  DATE OF BIRTH:  01/10/1961  SUBJECTIVE: Post hemodialysis.  On heparin drip.  Vascular follow-up today.  CHIEF COMPLAINT:   Chief Complaint  Patient presents with  . needs dialysis    REVIEW OF SYSTEMS:   Review of Systems  Constitutional: Negative for chills and fever.  HENT: Negative for hearing loss.   Eyes: Negative for blurred vision, double vision and photophobia.  Respiratory: Negative for cough, hemoptysis and shortness of breath.   Cardiovascular: Negative for palpitations, orthopnea and leg swelling.  Gastrointestinal: Negative for abdominal pain, diarrhea and vomiting.  Genitourinary: Negative for dysuria and urgency.  Musculoskeletal: Negative for myalgias and neck pain.  Skin: Negative for rash.  Neurological: Negative for dizziness, focal weakness, seizures, weakness and headaches.  Psychiatric/Behavioral: Negative for memory loss. The patient does not have insomnia.    CONSTITUTIONAL: No fever, fatigue or weakness.  EYES: No blurred or double vision.  EARS, NOSE, AND THROAT: No tinnitus or ear pain.  RESPIRATORY: No cough, shortness of breath, wheezing or hemoptysis.  CARDIOVASCULAR: No chest pain, orthopnea, edema.  GASTROINTESTINAL: No nausea, vomiting, diarrhea or abdominal pain.  GENITOURINARY: No dysuria, hematuria.  ENDOCRINE: No polyuria, nocturia,  HEMATOLOGY: No anemia, easy bruising or bleeding SKIN: No rash or lesion. MUSCULOSKELETAL: Left leg pain better than yesterday.  NEUROLOGIC: No tingling, numbness, weakness.  PSYCHIATRY: No anxiety or depression.   DRUG ALLERGIES:  No Known Allergies  VITALS:  Blood pressure 122/85, pulse 81, temperature 98.5 F (36.9 C), temperature source Oral, resp. rate 13, height 6\' 3"  (1.905 m), weight 79.3 kg, SpO2 91 %.  PHYSICAL EXAMINATION:  GENERAL:  58 y.o.-year-old patient lying in the bed  with no acute distress.  EYES: Pupils equal, round, reactive to light and accommodation. No scleral icterus. Extraocular muscles intact.  HEENT: Head atraumatic, normocephalic. Oropharynx and nasopharynx clear.  NECK:  Supple, no jugular venous distention. No thyroid enlargement, no tenderness.  LUNGS: Normal breath sounds bilaterally, no wheezing, rales,rhonchi or crepitation. No use of accessory muscles of respiration.  CARDIOVASCULAR: S1, S2 normal. No murmurs, rubs, or gallops.  ABDOMEN: Soft, nontender, nondistended. Bowel sounds present. No organomegaly or mass.  EXTREMITIES: No pedal edema, cyanosis, or clubbing.  Left leg warm to touch feeble pulses present in left dorsalis pedis, left posterior tibial today.  Right dorsalis pedis is present and strongly palpable.   NEUROLOGIC: Cranial nerves II through XII are intact. Muscle strength 5/5 in all extremities.  Foot is slightly cool to touch than the right foot.  PSYCHIATRIC: The patient is alert and oriented x 3.  SKIN: No obvious rash, lesion, or ulcer.    LABORATORY PANEL:   CBC Recent Labs  Lab 04/17/18 0443  WBC 7.0  HGB 11.1*  HCT 39.1  PLT 311   ------------------------------------------------------------------------------------------------------------------  Chemistries  Recent Labs  Lab 04/14/18 1534  NA 139  138  K 4.6  4.6  CL 89*  89*  CO2 38*  38*  GLUCOSE 81  82  BUN 15  15  CREATININE 5.62*  5.80*  CALCIUM 8.9  8.9   ------------------------------------------------------------------------------------------------------------------  Cardiac Enzymes No results for input(s): TROPONINI in the last 168 hours. ------------------------------------------------------------------------------------------------------------------  RADIOLOGY:  No results found.  EKG:   Orders placed or performed during the hospital encounter of 04/14/18  . ED EKG  . ED EKG    ASSESSMENT AND PLAN:   #1.  PAD, left  lower limb ischemia, left leg pain is better today, patient has equal pulse in left dorsalis pedis, left posterior tibial, continue heparin drip, I text message Dr. Lucky Cowboy, who is going to see the patient, same explained to the patient.  Patient is willing to stay for now.  Neurochecks.  Continues to request morphine,  2.  History of PAD, Essential hypertension: Controlled. 4.  Polysubstance abuse 5.  GERD: Continue PPI 6.  History of ESRD hemodialysis Tuesday, Thursday, Saturday, patient had hemodialysis today.  More than 50% time spent in counseling, coordination of care  All the records are reviewed and case discussed with Care Management/Social Workerr. Management plans discussed with the patient, family and they are in agreemen CODE STATUS: Full code  TOTAL TIME TAKING CARE OF THIS PATIENT: 38 minutes.   POSSIBLE D/C IN1- 2 DAYS, DEPENDING ON CLINICAL CONDITION.   Epifanio Lesches M.D on 04/17/2018 at 1:58 PM  Between 7am to 6pm - Pager - [phone]  After 6pm go to [email or web address] - password [email or web address] [facility]  Office  [phone]  CC: Primary care physician; [name], MD   Note: This dictation was prepared with Dragon dictation along with smaller phrase technology. Any transcriptional errors that result from this process are unintentional.

## 2018-04-17 NOTE — Progress Notes (Signed)
Patient complained of increased pain level, requested RN to have medication for pain relief adjusted.  RN paged hospitalist on call.  See new orders per Dr. Darvin Neighbours.

## 2018-04-17 NOTE — Progress Notes (Signed)
Pre HD assessment    04/17/18 0932  Neurological  Level of Consciousness Alert  Orientation Level Oriented X4  Respiratory  Respiratory Pattern Regular;Unlabored  Chest Assessment Chest expansion symmetrical  Cough Non-productive  Cardiac  Pulse Regular  ECG Monitor Yes  Cardiac Rhythm NSR  Vascular  R Radial Pulse +2  L Radial Pulse +2  Integumentary  Integumentary (WDL) X  Skin Color Appropriate for ethnicity  Musculoskeletal  Musculoskeletal (WDL) X  Generalized Weakness Yes  Assistive Device None  GU Assessment  Genitourinary (WDL) X  Genitourinary Symptoms  (HD)  Psychosocial  Psychosocial (WDL) WDL  Patient Behaviors Calm;Cooperative;Appropriate for situation

## 2018-04-17 NOTE — Progress Notes (Signed)
HD tx end    04/17/18 1316  Vital Signs  Pulse Rate 84  Pulse Rate Source Monitor  Resp 16  BP 114/60  BP Location Left Arm  BP Method Automatic  Patient Position (if appropriate) Lying  Oxygen Therapy  SpO2 95 %  O2 Device Room Air  During Hemodialysis Assessment  Dialysis Fluid Bolus Normal Saline  Bolus Amount (mL) 250 mL  Intra-Hemodialysis Comments Tx completed

## 2018-04-17 NOTE — Progress Notes (Signed)
Post HD assessment. Pt tolerated tx well without c/o or complications. Net UF 2503, goal met.    04/17/18 1321  Vital Signs  Temp 98.5 F (36.9 C)  Temp Source Oral  Pulse Rate 82  Pulse Rate Source Monitor  Resp 14  BP 108/69  BP Location Left Arm  BP Method Automatic  Patient Position (if appropriate) Lying  Oxygen Therapy  SpO2 91 %  O2 Device Room Air  Dialysis Weight  Weight 79.3 kg  Type of Weight Post-Dialysis  Post-Hemodialysis Assessment  Rinseback Volume (mL) 250 mL  KECN 79.4 V  Dialyzer Clearance Lightly streaked  Duration of HD Treatment -hour(s) 3.5 hour(s)  Hemodialysis Intake (mL) 500 mL  UF Total -Machine (mL) 3003 mL  Net UF (mL) 2503 mL  Tolerated HD Treatment Yes  Education / Care Plan  Dialysis Education Provided Yes  Documented Education in Care Plan Yes  Hemodialysis Catheter Right Internal jugular Double-lumen  No Placement Date or Time found.   Placed prior to admission: Yes  Orientation: Right  Access Location: Internal jugular  Hemodialysis Catheter Type: Double-lumen  Site Condition No complications  Blue Lumen Status Heparin locked  Red Lumen Status Heparin locked  Purple Lumen Status N/A  Catheter fill solution Heparin 1000 units/ml  Catheter fill volume (Arterial) 1.5 cc  Catheter fill volume (Venous) 1.5  Dressing Type Biopatch  Dressing Status Clean;Dry;Intact  Drainage Description None  Post treatment catheter status Capped and Clamped

## 2018-04-17 NOTE — Progress Notes (Signed)
Post HD assessment    04/17/18 1320  Neurological  Level of Consciousness Alert  Orientation Level Oriented X4  Respiratory  Respiratory Pattern Regular;Unlabored  Chest Assessment Chest expansion symmetrical  Cough Non-productive  Cardiac  Pulse Regular  ECG Monitor Yes  Cardiac Rhythm NSR  Ectopy Unifocal PVC's  Ectopy Frequency Frequent  Vascular  R Radial Pulse +2  L Radial Pulse +2  Integumentary  Integumentary (WDL) X  Skin Color Appropriate for ethnicity  Musculoskeletal  Musculoskeletal (WDL) X  Generalized Weakness Yes  Assistive Device None  GU Assessment  Genitourinary (WDL) X  Genitourinary Symptoms  (HD)  Psychosocial  Psychosocial (WDL) WDL

## 2018-04-17 NOTE — Progress Notes (Signed)
HD tx start    04/17/18 0939  Vital Signs  Pulse Rate 79  Pulse Rate Source Monitor  Resp 17  BP (!) 126/94  BP Location Left Arm  BP Method Automatic  Patient Position (if appropriate) Lying  Oxygen Therapy  SpO2 92 %  O2 Device Room Air  During Hemodialysis Assessment  Blood Flow Rate (mL/min) 400 mL/min  Arterial Pressure (mmHg) -110 mmHg  Venous Pressure (mmHg) 120 mmHg  Transmembrane Pressure (mmHg) 70 mmHg  Ultrafiltration Rate (mL/min) 860 mL/min  Dialysate Flow Rate (mL/min) 800 ml/min  Conductivity: Machine  14  HD Safety Checks Performed Yes  Dialysis Fluid Bolus Normal Saline  Bolus Amount (mL) 250 mL  Intra-Hemodialysis Comments Tx initiated  Hemodialysis Catheter Right Internal jugular Double-lumen  No Placement Date or Time found.   Placed prior to admission: Yes  Orientation: Right  Access Location: Internal jugular  Hemodialysis Catheter Type: Double-lumen  Blue Lumen Status Infusing  Red Lumen Status Infusing

## 2018-04-17 NOTE — Care Management (Signed)
Patient well know to care team.  He has ESRD, heart failure drug abuse and CAD.  he present this admission with pain in left leg.  His pulses are absent to faint.  he has verbalized several times since admitted that he is going to leave.  Staff have persuaded him to stay.  He is for angiogram of the left lower extremity 2.5.  Patient continues to miss many of his dialysis appointments.  He is not allowed to ride the [employer] due to behaviors. Frequents ED when he misses appointments and dialysis appintments

## 2018-04-17 NOTE — Care Management Important Message (Signed)
Copy of signed Medicare IM left in patient's room (out for dialysis).

## 2018-04-17 NOTE — Progress Notes (Signed)
Pre HD Assessment   04/17/18 0931  Vital Signs  Temp (!) 97.4 F (36.3 C)  Temp Source Oral  Pulse Rate 80  Pulse Rate Source Monitor  Resp 12  BP (!) 122/94  BP Location Left Arm  BP Method Automatic  Patient Position (if appropriate) Lying  Oxygen Therapy  SpO2 96 %  O2 Device Room Air  Pain Assessment  Pain Scale 0-10  Pain Score 0  Dialysis Weight  Weight 81.1 kg  Type of Weight Pre-Dialysis  Time-Out for Hemodialysis  What Procedure? HD  Pt Identifiers(min of two) First/Last Name;MRN/Account#  Correct Site? Yes  Correct Side? Yes  Correct Procedure? Yes  Consents Verified? Yes  Rad Studies Available? N/A  Safety Precautions Reviewed? Yes  Engineer, civil (consulting) Number  (2A)  Station Number 4  UF/Alarm Test Passed  Conductivity: Meter 14  Conductivity: Machine  14.1  pH 7.4  Reverse Osmosis main  Normal Saline Lot Number [id number]  Dialyzer Lot Number [id number]  Disposable Set Lot Number [id number]  Machine Temperature 98.6 F (37 C)  Musician and Audible Yes  Blood Lines Intact and Secured Yes  Pre Treatment Patient Checks  Vascular access used during treatment Catheter  Hepatitis B Surface Antigen Results Negative  Date Hepatitis B Surface Antigen Drawn 06/16/17  Hepatitis B Surface Antibody 50 (>10)  Date Hepatitis B Surface Antibody Drawn 06/16/17  Hemodialysis Consent Verified Yes  Hemodialysis Standing Orders Initiated Yes  ECG (Telemetry) Monitor On Yes  Prime Ordered Normal Saline  Length of  DialysisTreatment -hour(s) 3.5 Hour(s)  Dialyzer Elisio 17H NR  Dialysate 2K, 2.5 Ca  Dialysis Anticoagulant None  Dialysate Flow Ordered 800  Blood Flow Rate Ordered 400 mL/min  Ultrafiltration Goal 2.5 Liters  Pre Treatment Labs CBC;Phosphorus;Other (Comment) (heparin level )  Dialysis Blood Pressure Support Ordered Normal Saline  Education / Care Plan  Dialysis Education Provided Yes  Documented Education in Care Plan Yes  Hemodialysis  Catheter Right Internal jugular Double-lumen  No Placement Date or Time found.   Placed prior to admission: Yes  Orientation: Right  Access Location: Internal jugular  Hemodialysis Catheter Type: Double-lumen  Site Condition No complications  Blue Lumen Status Heparin locked  Red Lumen Status Heparin locked  Purple Lumen Status N/A  Dressing Type Biopatch  Dressing Status Clean;Dry;Intact  Drainage Description None

## 2018-04-17 NOTE — Telephone Encounter (Signed)
Spoke with Beverlee Nims about having anesthesia for the patient's procedure and Beverlee Nims stated she would let anesthesia know, I then informed scheduling that I had requested anesthesia.

## 2018-04-17 NOTE — Consult Note (Signed)
[facility] Vascular Consult Note  MRN : [id number]  Stanley [name] is a 58 y.o. (1960/11/10) male who presents with chief complaint of  Chief Complaint  Patient presents with  . needs dialysis   History of Present Illness:  The patient is a 58 year old male with a past medical history of coronary artery disease s/p bypass, end-stage renal disease on chronic hemodialysis, homicidal / suicidal ideations, hypertension, GERD, hyperlipidemia, cirrhosis of the liver with ascites, alcohol abuse, drug abuse, COPD, esophageal varices, GI bleed, anemia with a known history of peripheral artery disease requiring endovascular intervention in the past who presented to the [facility] emergency department via EMS with a chief complaint of shortness of breath and generalized weakness.   We will to the emergency department the patient noted that he had missed his last 2 dialysis appointments due to "sleeping late".  The patient endorsed a history of progressively worsening shortness of breath and weakness.  He denies any fever however states 1 to 2 days of nausea but no vomiting.  No abdominal pain, constipation or diarrhea.  Patient denies any issues with his PermCath during dialysis.  The patient endorses a history of two weeks of worsening left lower extremity foot pain.  Patient notes that this foot pain is constant however it does worsen with ambulation.  Patient denies any other claudication-like symptoms.  No symptoms to the right lower extremity.  Patient denies any rest pain or ulcer formation to the left lower extremity.  Patient denies any recent surgery or trauma to the left lower extremity.  Patient denies any pallor, erythema or loss of motor/sensation to the left foot.  Vascular surgery was consulted by Dr. Vianne Bulls for further recommendations.  Current Facility-Administered Medications  Medication Dose Route Frequency Provider Last Rate Last  Dose  . acetaminophen (TYLENOL) tablet 650 mg  650 mg Oral Q6H PRN Fritzi Mandes, MD       Or  . acetaminophen (TYLENOL) suppository 650 mg  650 mg Rectal Q6H PRN Fritzi Mandes, MD      . alum & mag hydroxide-simeth (MAALOX/MYLANTA) 200-200-20 MG/5ML suspension 30 mL  30 mL Oral Q4H PRN Epifanio Lesches, MD   30 mL at 04/15/18 1612  . calcium acetate (PHOSLO) capsule 2,001 mg  2,001 mg Oral TID WC Fritzi Mandes, MD   2,001 mg at 04/17/18 1412  . calcium carbonate (TUMS - dosed in mg elemental calcium) chewable tablet 200 mg of elemental calcium  1 tablet Oral BID Fritzi Mandes, MD   200 mg of elemental calcium at 04/17/18 0829  . Chlorhexidine Gluconate Cloth 2 % PADS 6 each  6 each Topical Q0600 Murlean Iba, MD   6 each at 04/17/18 0443  . heparin ADULT infusion 100 units/mL (25000 units/271mL sodium chloride 0.45%)  1,700 Units/hr Intravenous Continuous Pernell Dupre, RPH 17 mL/hr at 04/17/18 0441 1,700 Units/hr at 04/17/18 0441  . hydrALAZINE (APRESOLINE) injection 10 mg  10 mg Intravenous Q6H PRN Fritzi Mandes, MD      . ipratropium-albuterol (DUONEB) 0.5-2.5 (3) MG/3ML nebulizer solution 3 mL  3 mL Nebulization Q4H PRN Fritzi Mandes, MD      . mometasone-formoterol [facility]) 200-5 MCG/ACT inhaler 2 puff  2 puff Inhalation BID Fritzi Mandes, MD   2 puff at 04/17/18 [phone]  . morphine 2 MG/ML injection 1 mg  1 mg Intravenous Q6H PRN Fritzi Mandes, MD   1 mg at 04/17/18 1030  . multivitamin (RENA-VIT) tablet 1 tablet  1 tablet Oral QHS Fritzi Mandes, MD   1 tablet at 04/16/18 2233  . ondansetron (ZOFRAN) tablet 4 mg  4 mg Oral Q6H PRN Fritzi Mandes, MD       Or  . ondansetron [facility]) injection 4 mg  4 mg Intravenous Q6H PRN Fritzi Mandes, MD   4 mg at 04/16/18 1935  . oxyCODONE (Oxy IR/ROXICODONE) immediate release tablet 5 mg  5 mg Oral Q4H PRN Fritzi Mandes, MD   5 mg at 04/17/18 0544  . pantoprazole (PROTONIX) EC tablet 80 mg  80 mg Oral Daily Epifanio Lesches, MD   80 mg at 04/17/18 0829  . tiotropium  (SPIRIVA) inhalation capsule ([facility] use ONLY) 18 mcg  18 mcg Inhalation Daily Fritzi Mandes, MD   18 mcg at 04/17/18 0831   Past Medical History:  Diagnosis Date  . Alcohol abuse   . CHF (congestive heart failure) ([place])   . Cirrhosis ([place])   . Coronary artery disease 2009  . Drug abuse ([place])   . End stage renal disease on dialysis [facility]) NEPHROLOGIST-   DR [facility]  IN [place]   HEMODIALYSIS --   TUES/  THURS/  SAT  . Gastrointestinal bleed 06/13/2017   From chart...hx of multiple GI bleeds  . GERD (gastroesophageal reflux disease)   . Hyperlipidemia   . Hypertension   . PAD (peripheral artery disease) ([place])   . Renal insufficiency    Per pt, 32 oz fluid restriction per day  . S/P triple vessel bypass 06/09/2016   2009ish  . Suicidal ideation    & HOMICIDAL IDEATION --  06-16-2013   ADMITTED TO BEHAVIOR HEALTH   Past Surgical History:  Procedure Laterality Date  . A/V FISTULAGRAM Right 06/06/2017   Procedure: A/V FISTULAGRAM;  Surgeon: Katha Cabal, MD;  Location: [place] CV LAB;  Service: Cardiovascular;  Laterality: Right;  . A/V SHUNT INTERVENTION N/A 06/06/2017   Procedure: A/V SHUNT INTERVENTION;  Surgeon: Katha Cabal, MD;  Location: [place] CV LAB;  Service: Cardiovascular;  Laterality: N/A;  . AGILE CAPSULE N/A 06/19/2016   Procedure: AGILE CAPSULE;  Surgeon: [name], MD;  Location: [facility] ENDOSCOPY;  Service: Endoscopy;  Laterality: N/A;  . COLONOSCOPY WITH PROPOFOL N/A 06/18/2016   Procedure: COLONOSCOPY WITH PROPOFOL;  Surgeon: [name], MD;  Location: [facility] ENDOSCOPY;  Service: Endoscopy;  Laterality: N/A;  . COLONOSCOPY WITH PROPOFOL N/A 08/12/2016   Procedure: COLONOSCOPY WITH PROPOFOL;  Surgeon: [name], MD;  Location[facility] ENDOSCOPY;  Service: Endoscopy;  Laterality: N/A;  . COLONOSCOPY WITH PROPOFOL N/A 05/05/2017   Procedure: COLONOSCOPY WITH PROPOFOL;  Surgeon: Manya Silvas, MD;  Location[facility] ENDOSCOPY;  Service: Endoscopy;  Laterality:  N/A;  . CORONARY ANGIOPLASTY  ?   PT UNABLE TO TELL IF  BEFORE OR AFTER  CABG  . CORONARY ARTERY BYPASS GRAFT  2008  ([place] , [place])   3 VESSEL  . DIALYSIS FISTULA CREATION  LAST SURGERY  APPOX  2008  . ENTEROSCOPY N/A 05/10/2016   Procedure: ENTEROSCOPY;  Surgeon: Jerene Bears, MD;  Location: [place];  Service: Gastroenterology;  Laterality: N/A;  . ENTEROSCOPY N/A 08/12/2016   Procedure: ENTEROSCOPY;  Surgeon: [name], MD;  Location: [facility] ENDOSCOPY;  Service: Endoscopy;  Laterality: N/A;  . ENTEROSCOPY Left 06/03/2017   Procedure: ENTEROSCOPY;  Surgeon: Virgel Manifold, MD;  Location: [facility] ENDOSCOPY;  Service: Endoscopy;  Laterality: Left;  Procedure date will ultimately depend on when patient is medically optimized before the procedure, pending  hemodialysis and blood transfusions etc. Will place on schedule and change depending on clinical status.   . ENTEROSCOPY N/A 06/05/2017   Procedure: ENTEROSCOPY;  Surgeon: Virgel Manifold, MD;  Location: [facility] ENDOSCOPY;  Service: Endoscopy;  Laterality: N/A;  . ENTEROSCOPY N/A 06/15/2017   Procedure: Push ENTEROSCOPY;  Surgeon: [name], MD;  Location: [facility] ENDOSCOPY;  Service: Endoscopy;  Laterality: N/A;  . ESOPHAGOGASTRODUODENOSCOPY N/A 05/07/2015   Procedure: ESOPHAGOGASTRODUODENOSCOPY (EGD);  Surgeon: Hulen Luster, MD;  Location: [facility] ENDOSCOPY;  Service: Endoscopy;  Laterality: N/A;  . ESOPHAGOGASTRODUODENOSCOPY (EGD) WITH PROPOFOL N/A 05/17/2015   Procedure: ESOPHAGOGASTRODUODENOSCOPY (EGD) WITH PROPOFOL;  Surgeon: [name], MD;  Location: [facility] ENDOSCOPY;  Service: Endoscopy;  Laterality: N/A;  . ESOPHAGOGASTRODUODENOSCOPY (EGD) WITH PROPOFOL N/A 01/20/2016   Procedure: ESOPHAGOGASTRODUODENOSCOPY (EGD) WITH PROPOFOL;  Surgeon: [name], MD;  Location: [facility] ENDOSCOPY;  Service: Endoscopy;  Laterality: N/A;  . ESOPHAGOGASTRODUODENOSCOPY (EGD) WITH PROPOFOL N/A 04/17/2016   Procedure: ESOPHAGOGASTRODUODENOSCOPY (EGD) WITH PROPOFOL;   Surgeon: Lin Landsman, MD;  Location: [facility] ENDOSCOPY;  Service: Gastroenterology;  Laterality: N/A;  . ESOPHAGOGASTRODUODENOSCOPY (EGD) WITH PROPOFOL  05/09/2016   Procedure: ESOPHAGOGASTRODUODENOSCOPY (EGD) WITH PROPOFOL;  Surgeon: Jerene Bears, MD;  Location: [place];  Service: Endoscopy;;  . ESOPHAGOGASTRODUODENOSCOPY (EGD) WITH PROPOFOL N/A 06/16/2016   Procedure: ESOPHAGOGASTRODUODENOSCOPY (EGD) WITH PROPOFOL;  Surgeon: [name], MD;  Location: [facility] ENDOSCOPY;  Service: Endoscopy;  Laterality: N/A;  . ESOPHAGOGASTRODUODENOSCOPY (EGD) WITH PROPOFOL N/A 05/05/2017   Procedure: ESOPHAGOGASTRODUODENOSCOPY (EGD) WITH PROPOFOL;  Surgeon: Manya Silvas, MD;  Location: [facility] ENDOSCOPY;  Service: Endoscopy;  Laterality: N/A;  . ESOPHAGOGASTRODUODENOSCOPY (EGD) WITH PROPOFOL N/A 06/15/2017   Procedure: ESOPHAGOGASTRODUODENOSCOPY (EGD) WITH PROPOFOL;  Surgeon: [name], MD;  Location: [facility] ENDOSCOPY;  Service: Endoscopy;  Laterality: N/A;  . GIVENS CAPSULE STUDY N/A 05/07/2016   Procedure: GIVENS CAPSULE STUDY;  Surgeon: Doran Stabler, MD;  Location: [place];  Service: Endoscopy;  Laterality: N/A;  . MANDIBULAR HARDWARE REMOVAL N/A 07/29/2013   Procedure: REMOVAL OF ARCH BARS;  Surgeon: Theodoro Kos, DO;  Location: [place];  Service: Plastics;  Laterality: N/A;  . ORIF MANDIBULAR FRACTURE N/A 06/05/2013   Procedure: REPAIR OF MANDIBULAR FRACTURE x 2 with maxillo-mandibular fixation ;  Surgeon: Theodoro Kos, DO;  Location: [place];  Service: Plastics;  Laterality: N/A;  . PARACENTESIS    . PERIPHERAL ARTERIAL STENT GRAFT Left    Social History Social History   Tobacco Use  . Smoking status: Current Every Day Smoker    Packs/day: 0.15    Years: 40.00    Pack years: 6.00    Types: Cigarettes  . Smokeless tobacco: Never Used  Substance Use Topics  . Alcohol use: No    Comment: pt reports quitting after learning about cirrhosis  . Drug use: No     Frequency: 7.0 times per week    Types: Marijuana, Cocaine   Family History Family History  Problem Relation Age of Onset  . Colon cancer Mother   . Cancer Father   . Cancer Sister   . Kidney disease Brother   Patient denies any family history of peripheral artery disease, venous disease or clotting/bleeding disorder.  No Known Allergies  REVIEW OF SYSTEMS (Negative unless checked)  Constitutional: [] Weight loss  [] Fever  [] Chills Cardiac: [] Chest pain   [] Chest pressure   [] Palpitations   [] Shortness of breath when laying flat   [] Shortness of breath at rest   [] Shortness of breath with exertion. Vascular:  [] Pain  in legs with walking   [] Pain in legs at rest   [] Pain in legs when laying flat   [] Claudication   [x] Pain in feet when walking  [x] Pain in feet at rest  [] Pain in feet when laying flat   [] History of DVT   [] Phlebitis   [] Swelling in legs   [] Varicose veins   [] Non-healing ulcers Pulmonary:   [] Uses home oxygen   [] Productive cough   [] Hemoptysis   [] Wheeze  [x] COPD   [] Asthma Neurologic:  [] Dizziness  [] Blackouts   [] Seizures   [] History of stroke   [] History of TIA  [] Aphasia   [] Temporary blindness   [] Dysphagia   [] Weakness or numbness in arms   [] Weakness or numbness in legs Musculoskeletal:  [] Arthritis   [] Joint swelling   [] Joint pain   [] Low back pain Hematologic:  [] Easy bruising  [] Easy bleeding   [] Hypercoagulable state   [x] Anemic  [] Hepatitis Gastrointestinal:  [] Blood in stool   [] Vomiting blood  [x] Gastroesophageal reflux/heartburn   [] Difficulty swallowing. Genitourinary:  [x] Chronic kidney disease   [] Difficult urination  [] Frequent urination  [] Burning with urination   [] Blood in urine Skin:  [] Rashes   [] Ulcers   [] Wounds Psychological:  [] History of anxiety   []  History of major depression.  Physical Examination  Vitals:   04/17/18 1316 04/17/18 1321 04/17/18 1323 04/17/18 1357  BP: 114/60 108/69  122/85  Pulse: 84 82 82 81  Resp: 16 14 13    Temp:   98.5 F (36.9 C)  98.5 F (36.9 C)  TempSrc:  Oral  Oral  SpO2: 95% 91% 92% 91%  Weight:  79.3 kg    Height:       Body mass index is 21.85 kg/m. Gen:  WD/WN, NAD Head: [place]/AT, No temporalis wasting. Prominent temp pulse not noted. Ear/Nose/Throat: Hearing grossly intact, nares w/o erythema or drainage, oropharynx w/o Erythema/Exudate Eyes: Sclera non-icteric, conjunctiva clear Neck: Trachea midline.  No JVD.  Pulmonary:  Good air movement, respirations not labored, equal bilaterally.  Cardiac: RRR, normal S1, S2. Vascular:  Vessel Right Left  Radial Palpable Palpable  Ulnar Palpable Palpable  Brachial Palpable Palpable  Carotid Palpable, without bruit Palpable, without bruit  Aorta Not palpable N/A  Femoral Palpable Palpable  Popliteal Palpable Palpable  PT Non-Palpable Non-Palpable  DP Non-Palpable Non-Palpable   Right IJ Permcath: Intact. No signs of infection noted.   Left Lower Extremity: Thigh soft. Calf. Soft. Extremity warm distally cooler approximately midfoot. Hard to palpate pedal pulses. No pallor, erythema. Skin intact. Motor / Sensory intact.   Gastrointestinal: soft, non-tender/non-distended. No guarding/reflex.  Musculoskeletal: M/S 5/5 throughout.  Extremities without ischemic changes.  No deformity or atrophy. No edema. Neurologic: Sensation grossly intact in extremities.  Symmetrical.  Speech is fluent. Motor exam as listed above. Psychiatric: Judgment intact, Mood & affect appropriate for pt's clinical situation. Dermatologic: No rashes or ulcers noted.  No cellulitis or open wounds. Lymph : No Cervical, Axillary, or Inguinal lymphadenopathy.  CBC Lab Results  Component Value Date   WBC 7.0 04/17/2018   HGB 11.1 (L) 04/17/2018   HCT 39.1 04/17/2018   MCV 88.5 04/17/2018   PLT 311 04/17/2018   BMET    Component Value Date/Time   NA 139 04/14/2018 1534   NA 138 04/14/2018 1534   NA 143 06/10/2014 1251   K 4.6 04/14/2018 1534   K 4.6 04/14/2018  1534   K 5.1 06/10/2014 1251   CL 89 (L) 04/14/2018 1534   CL 89 (L) 04/14/2018 1534  CL 102 06/10/2014 1251   CO2 38 (H) 04/14/2018 1534   CO2 38 (H) 04/14/2018 1534   CO2 30 06/10/2014 1251   GLUCOSE 81 04/14/2018 1534   GLUCOSE 82 04/14/2018 1534   GLUCOSE 126 (H) 06/10/2014 1251   BUN 15 04/14/2018 1534   BUN 15 04/14/2018 1534   BUN 20 06/10/2014 1251   CREATININE 5.62 (H) 04/14/2018 1534   CREATININE 5.80 (H) 04/14/2018 1534   CREATININE 5.42 (H) 06/10/2014 1251   CALCIUM 8.9 04/14/2018 1534   CALCIUM 8.9 04/14/2018 1534   CALCIUM 8.9 06/10/2014 1251   GFRNONAA 10 (L) 04/14/2018 1534   GFRNONAA 10 (L) 04/14/2018 1534   GFRNONAA 11 (L) 06/10/2014 1251   GFRAA 12 (L) 04/14/2018 1534   GFRAA 11 (L) 04/14/2018 1534   GFRAA 13 (L) 06/10/2014 1251   Estimated Creatinine Clearance: 16.1 mL/min (A) (by C-G formula based on SCr of 5.62 mg/dL (H)).  COAG Lab Results  Component Value Date   INR 1.01 04/14/2018   INR 1.08 01/29/2018   INR 1.10 12/06/2017   Radiology Dg Chest 2 View  Result Date: 03/26/2018 CLINICAL DATA:  Shortness of breath and weakness. EXAM: CHEST - 2 VIEW COMPARISON:  Chest x-ray dated March 13, 2018. FINDINGS: Unchanged tunneled right internal jugular dialysis catheter. Stable cardiomegaly status post CABG. Mildly progressed diffuse interstitial thickening. No focal consolidation, pleural effusion, or pneumothorax. No acute osseous abnormality. IMPRESSION: 1. Mildly progressed interstitial pulmonary edema. Electronically Signed   By: Titus Dubin M.D.   On: 03/26/2018 15:25   US Paracentesis  Result Date: 04/04/2018 INDICATION: Recurrent ascites. EXAM: ULTRASOUND GUIDED PARACENTESIS MEDICATIONS: None. COMPLICATIONS: None immediate. PROCEDURE: Informed written consent was obtained from the patient after a discussion of the risks, benefits and alternatives to treatment. A timeout was performed prior to the initiation of the procedure. Initial ultrasound  was performed to localize ascites. The right lower abdomen was prepped and draped in the usual sterile fashion. 1% lidocaine was used for local anesthesia. Following this, a 6 Fr Safe-T-Centesis catheter was introduced. An ultrasound image was saved for documentation purposes. The paracentesis was performed. The catheter was removed and a dressing was applied. The patient tolerated the procedure well without immediate post procedural complication. FINDINGS: A total of approximately 5 L of amber colored fluid was removed. IMPRESSION: Successful ultrasound-guided paracentesis yielding 5 liters of peritoneal fluid. Electronically Signed   By: Aletta Edouard M.D.   On: 04/04/2018 10:22   US Paracentesis  Result Date: 03/22/2018 INDICATION: Recurrent ascites. EXAM: ULTRASOUND GUIDED PARACENTESIS MEDICATIONS: None. COMPLICATIONS: None immediate. PROCEDURE: Informed written consent was obtained from the patient after a discussion of the risks, benefits and alternatives to treatment. A timeout was performed prior to the initiation of the procedure. Initial ultrasound was performed to localize ascites. The right lower abdomen was prepped and draped in the usual sterile fashion. 1% lidocaine was used for local anesthesia. Following this, a 6 Fr Safe-T-Centesis catheter was introduced. An ultrasound image was saved for documentation purposes. The paracentesis was performed. The catheter was removed and a dressing was applied. The patient tolerated the procedure well without immediate post procedural complication. FINDINGS: A total of approximately 5 L of yellow fluid was removed. IMPRESSION: Successful ultrasound-guided paracentesis yielding 5 liters of peritoneal fluid. Electronically Signed   By: Aletta Edouard M.D.   On: 03/22/2018 14:28   Dg Chest Portable 1 View  Result Date: 04/09/2018 CLINICAL DATA:  Shortness of breath. EXAM: PORTABLE CHEST 1 VIEW COMPARISON:  04/08/2018 and 04/03/2018 and 03/26/2018 FINDINGS:  There is chronic cardiomegaly. Slight pulmonary vascular congestion, unchanged. Central venous catheter in place, unchanged. No pulmonary edema. No effusions. Aortic atherosclerosis. CABG. IMPRESSION: 1. Chronic cardiomegaly with mild chronic pulmonary vascular congestion. No significant change since the prior exam. 2.  Aortic Atherosclerosis (ICD10-I70.0). Electronically Signed   By: Lorriane Shire M.D.   On: 04/09/2018 12:31   Dg Chest Portable 1 View  Result Date: 04/08/2018 CLINICAL DATA:  Shortness of breath, recent missed dialysis treatment EXAM: PORTABLE CHEST 1 VIEW COMPARISON:  04/03/2018 FINDINGS: Cardiac shadow remains enlarged. Postsurgical changes are again seen. Dialysis catheter is again noted. Increase in central vascular congestion is noted when compared with the prior study. No focal infiltrate or effusion is seen. IMPRESSION: Slight increase in central vascular congestion consistent with volume overload likely related to the recently missed dialysis session. Electronically Signed   By: Inez Catalina M.D.   On: 04/08/2018 17:13   Dg Chest Portable 1 View  Result Date: 04/03/2018 CLINICAL DATA:  Shortness of breath, tightness in lower abdomen, 90% oxygen saturation on room air at arrival EXAM: PORTABLE CHEST 1 VIEW COMPARISON:  03/26/2018 FINDINGS: RIGHT jugular central venous catheter with tip projecting over SVC. Enlargement of cardiac silhouette post CABG. Slight pulmonary vascular congestion. Atherosclerotic calcifications aorta. Persistent interstitial prominence which likely represents mild persistent pulmonary edema. Minimal bibasilar atelectasis. No gross pleural effusion or pneumothorax. Healing fractures of the lateral RIGHT ninth and tenth ribs. IMPRESSION: Enlargement of cardiac silhouette with pulmonary vascular congestion and probable mild pulmonary edema. Healing fractures of the lateral RIGHT ninth and tenth ribs. Electronically Signed   By: Lavonia Dana M.D.   On: 04/03/2018  09:01   Assessment/Plan The patient is a 58 year old male with a past medical history of coronary artery disease s/p bypass, end-stage renal disease on chronic hemodialysis, homicidal / suicidal ideations, hypertension, GERD, hyperlipidemia, cirrhosis of the liver with ascites, alcohol abuse, drug abuse, COPD, esophageal varices, GI bleed, anemia with a known history of peripheral artery disease requiring endovascular intervention in the past who presented to the [facility] emergency department via EMS with a chief complaint of shortness of breath and generalized weakness after missing 2 dialysis treatments  1. PAD: Patient with a known history of peripheral artery disease requiring endovascular intervention in the past.  Patient has multiple risk factors for peripheral artery disease and seems to be noncompliant with taking his medication/outpatient follow-up.  Patient is complaining of progressively worsening lifestyle limiting pain to the left foot.  Hard to palpate pedal pulses.  The extremity is warm approximately to mid foot then becomes cooler.  There is no acute vascular compromise noted on physical exam however do recommend a left lower extremity angiogram with possible intervention to assess the patient's anatomy and contributing degree of peripheral artery disease.  If appropriate at that time an attempt to revascularize the leg can be made.  Procedure, risks and benefits explained to the patient.  All questions answered.  The patient wishes to proceed.  We will plan on a left lower extremity angiogram with possible intervention with Dr. Lucky Cowboy on Thursday 2. ESRD: Currently the patient is being maintained by a right IJ PermCath without issue. 3. Hypertension: Encouraged good control as its slows the progression of atherosclerotic disease 4. Hyperlipidemia: Encouraged good control as its slows the progression of atherosclerotic disease  Discussed with Dr. Mayme Genta, PA-C  04/17/2018 2:59 PM  This note was created  with Dragon medical transcription system.  Any error is purely unintentional.

## 2018-04-17 NOTE — Consult Note (Signed)
ANTICOAGULATION CONSULT NOTE - Follow up [place] for Heparin Indication: DVT  No Known Allergies  Patient Measurements: Height: 6\' 3"  (190.5 cm) Weight: 173 lb 3.2 oz (78.6 kg) IBW/kg (Calculated) : 84.5 Heparin Dosing Weight: 68.9 kg > 65.7 kg  Addendum RN called with updated weight of 78.6 kg (better matches weight earlier this month) New hep weight = 78.6 kg   Vital Signs: Temp: 97.5 F (36.4 C) (02/04 0523) Temp Source: Oral (02/04 0523) BP: 127/91 (02/04 0523) Pulse Rate: 77 (02/04 0523)  Labs: Recent Labs    04/14/18 1534  04/15/18 0937  04/16/18 0401 04/16/18 1327 04/16/18 2150 04/17/18 0443  HGB 12.8*  --  11.0*  --  11.6*  --   --  11.1*  HCT 45.0  --  38.5*  --  41.0  --   --  39.1  PLT 302  --  293  --  316  --   --  311  APTT 35  --   --   --   --   --   --   --   LABPROT 13.2  --   --   --   --   --   --   --   INR 1.01  --   --   --   --   --   --   --   HEPARINUNFRC  --    < > 0.15*   < > 0.29* 0.37 0.30 0.32  CREATININE 5.62*  5.80*  --   --   --   --   --   --   --    < > = values in this interval not displayed.    Estimated Creatinine Clearance: 15.9 mL/min (A) (by C-G formula based on SCr of 5.62 mg/dL (H)).   Medications:  Patient not on any PTA anticoagulants.  Assessment: 58 yo male presented to ED after HD with left leg pain and left foot ischemia. Patient has not yet been evaluated by vascular surgery today, although they note plans to do an angiogram. Hgb is down slightly from baseline but stable with no noted bleeding.  Heparin Course 2/1 Initiation: 4000 unit bolus, followed by 1150 unit/hr  2/2 0016 HL 0.11: 2000 unit bolus, inc to 1350 unit/hr. 2/2 0937 HL 0.15: 2000 unit bolus, inc to 1550 unit/hr. 2/2 1951 HL 0.32 2/3 0401 HL 0.29: inc to 1700 units/hr 2/3 1327 HL 0.37  Goal of Therapy:  Heparin level 0.3-0.7 units/ml Monitor platelets by anticoagulation protocol: Yes   Plan:  First therapeutic level  has returned following last rate adjustment. Will recheck HL in 8 hours. If second level is therapeutic we will move to once daily levels in the mornings.   CBC and HL with AM labs per protocol.   2/3 PM heparin level 0.30. Continue current regimen. Recheck heparin level and CBC with tomorrow AM labs.  2/4 PM heparin level 0.32. Continue current regimen. Recheck heparin level and CBC with tomorrow AM labs.  Pharmacy will continue to monitor and adjust as needed.   Eloise Harman, PharmD Clinical Pharmacist 04/17/2018 6:36 AM

## 2018-04-18 ENCOUNTER — Other Ambulatory Visit (INDEPENDENT_AMBULATORY_CARE_PROVIDER_SITE_OTHER): Payer: Self-pay | Admitting: Nurse Practitioner

## 2018-04-18 ENCOUNTER — Other Ambulatory Visit (INDEPENDENT_AMBULATORY_CARE_PROVIDER_SITE_OTHER): Payer: Self-pay | Admitting: Vascular Surgery

## 2018-04-18 LAB — CBC
HCT: 37.1 % — ABNORMAL LOW (ref 39.0–52.0)
Hemoglobin: 10.7 g/dL — ABNORMAL LOW (ref 13.0–17.0)
MCH: 25.4 pg — ABNORMAL LOW (ref 26.0–34.0)
MCHC: 28.8 g/dL — ABNORMAL LOW (ref 30.0–36.0)
MCV: 88.1 fL (ref 80.0–100.0)
NRBC: 0 % (ref 0.0–0.2)
PLATELETS: 295 10*3/uL (ref 150–400)
RBC: 4.21 MIL/uL — ABNORMAL LOW (ref 4.22–5.81)
RDW: 20.8 % — ABNORMAL HIGH (ref 11.5–15.5)
WBC: 5.3 10*3/uL (ref 4.0–10.5)

## 2018-04-18 LAB — HEPARIN LEVEL (UNFRACTIONATED): Heparin Unfractionated: 0.34 IU/mL (ref 0.30–0.70)

## 2018-04-18 MED ORDER — MORPHINE SULFATE (PF) 4 MG/ML IV SOLN
INTRAVENOUS | Status: AC
  Administered 2018-04-18: 2 mg
  Filled 2018-04-18: qty 1

## 2018-04-18 MED ORDER — CEFAZOLIN SODIUM-DEXTROSE 1-4 GM/50ML-% IV SOLN: 1.0000 g | INTRAVENOUS | Status: AC

## 2018-04-18 MED ORDER — LABETALOL HCL 100 MG PO TABS
100.0000 mg | ORAL_TABLET | Freq: Every day | ORAL | Status: DC
  Administered 2018-04-18 – 2018-04-20 (×2): 100 mg via ORAL
  Filled 2018-04-18 (×3): qty 1

## 2018-04-18 NOTE — Progress Notes (Signed)
[place] at [place] NAME: Stanley [name]    MR#:  [id number]  DATE OF BIRTH:  07-07-1960  SUBJECTIVE: Increasing pain in the left ankle.  Requesting more morphine.  CHIEF COMPLAINT:   Chief Complaint  Patient presents with  . needs dialysis  More sleepy today  REVIEW OF SYSTEMS:   Review of Systems  Constitutional: Negative for chills and fever.  HENT: Negative for hearing loss.   Eyes: Negative for blurred vision, double vision and photophobia.  Respiratory: Negative for cough, hemoptysis and shortness of breath.   Cardiovascular: Negative for palpitations, orthopnea and leg swelling.  Gastrointestinal: Negative for abdominal pain, diarrhea and vomiting.  Genitourinary: Negative for dysuria and urgency.  Musculoskeletal: Negative for myalgias and neck pain.  Skin: Negative for rash.  Neurological: Negative for dizziness, focal weakness, seizures, weakness and headaches.  Psychiatric/Behavioral: Negative for memory loss. The patient does not have insomnia.   Diminished pulses left ankle DRUG ALLERGIES:  No Known Allergies  VITALS:  Blood pressure (!) 127/97, pulse 86, temperature 98.1 F (36.7 C), temperature source Oral, resp. rate 18, height 6\' 3"  (1.905 m), weight 79.3 kg, SpO2 91 %.  PHYSICAL EXAMINATION:  GENERAL:  58 y.o.-year-old patient lying in the bed with no acute distress.  EYES: Pupils equal, round, reactive to light and accommodation. No scleral icterus. Extraocular muscles intact.  HEENT: Head atraumatic, normocephalic. Oropharynx and nasopharynx clear.  NECK:  Supple, no jugular venous distention. No thyroid enlargement, no tenderness.  LUNGS: Normal breath sounds bilaterally, no wheezing, rales,rhonchi or crepitation. No use of accessory muscles of respiration.  CARDIOVASCULAR: S1, S2 normal. No murmurs, rubs, or gallops.  ABDOMEN: Soft, nontender, nondistended. Bowel sounds present. No organomegaly or  mass.  EXTREMITIES: No pedal edema, cyanosis, or clubbing.  Left leg warm to touch feeble pulses present in left dorsalis pedis, left posterior tibial today.  Right dorsalis pedis is present and strongly palpable.   NEUROLOGIC: Cranial nerves II through XII are intact. Muscle strength 5/5 in all extremities.  Foot is slightly cool to touch than the right foot.  PSYCHIATRIC: The patient is alert and oriented x 3.  SKIN: No obvious rash, lesion, or ulcer.    LABORATORY PANEL:   CBC Recent Labs  Lab 04/18/18 0457  WBC 5.3  HGB 10.7*  HCT 37.1*  PLT 295   ------------------------------------------------------------------------------------------------------------------  Chemistries  Recent Labs  Lab 04/14/18 1534  NA 139  138  K 4.6  4.6  CL 89*  89*  CO2 38*  38*  GLUCOSE 81  82  BUN 15  15  CREATININE 5.62*  5.80*  CALCIUM 8.9  8.9   ------------------------------------------------------------------------------------------------------------------  Cardiac Enzymes No results for input(s): TROPONINI in the last 168 hours. ------------------------------------------------------------------------------------------------------------------  RADIOLOGY:  No results found.  EKG:   Orders placed or performed during the hospital encounter of 04/14/18  . ED EKG  . ED EKG    ASSESSMENT AND PLAN:   #1. PAD, left lower limb ischemia, diminished pulses on left leg, continue heparin drip, vascular surgery recommends angiogram tomorrow noted to revascularize the leg if needed.  Cautious use of morphine.    History of PAD, noncompliant with medicines, Essential hypertension: Controlled. 4.  Polysubstance abuse 5.  GERD: Continue PPI 6.  History of ESRD hemodialysis Tuesday, Thursday, Saturday, patient had hemodialysis today.  More than 50% time spent in counseling, coordination of care  All the records are reviewed and case discussed with Care Management/Social  Workerr. Management plans discussed with the patient, family and they are in agreemen CODE STATUS: Full code  TOTAL TIME TAKING CARE OF THIS PATIENT: 38 minutes.   POSSIBLE D/C IN1- 2 DAYS, DEPENDING ON CLINICAL CONDITION.   Stanley [name] M.D on 04/18/2018 at 11:47 AM  Between 7am to 6pm - Pager - [phone]  After 6pm go to [email or web address] - password [email or web address]  Office  [phone]  CC: Primary care physician; [name], MD   Note: This dictation was prepared with Dragon dictation along with smaller phrase technology. Any transcriptional errors that result from this process are unintentional.

## 2018-04-18 NOTE — Progress Notes (Signed)
[place], [place] 04/18/18  Subjective:  Angiogram to be scheduled for tomorrow. Patient also due for dialysis tomorrow.  Objective:  Vital signs in last 24 hours:  Temp:  [97.5 F (36.4 C)-98.5 F (36.9 C)] 97.5 F (36.4 C) (02/05 1222) Pulse Rate:  [73-86] 73 (02/05 1222) Resp:  [17-20] 17 (02/05 1222) BP: (122-127)/(79-97) 124/79 (02/05 1222) SpO2:  [91 %-94 %] 93 % (02/05 1222)  Weight change:  Filed Weights   04/15/18 0730 04/17/18 0931 04/17/18 1321  Weight: 78.6 kg 81.1 kg 79.3 kg    Intake/Output:    Intake/Output Summary (Last 24 hours) at 04/18/2018 1330 Last data filed at 04/18/2018 0400 Gross per 24 hour  Intake 555.43 ml  Output 0 ml  Net 555.43 ml     Physical Exam: General:  No acute distress, laying in the bed  HEENT  moist oral mucous membranes  Neck  supple  Pulm/lungs  normal breathing effort,   CVS/Heart  no rub  Abdomen:   Distended, ascites  Extremities:  1+ bilateral lower extremity edema  Neurologic:  Alert, able to answer questions  Skin:  No acute rashes  Access:  Right IJ PermCath       Basic Metabolic Panel:  Recent Labs  Lab 04/14/18 1534 04/17/18 0443  NA 139  138  --   K 4.6  4.6  --   CL 89*  89*  --   CO2 38*  38*  --   GLUCOSE 81  82  --   BUN 15  15  --   CREATININE 5.62*  5.80*  --   CALCIUM 8.9  8.9  --   PHOS 4.9* 6.7*     CBC: Recent Labs  Lab 04/14/18 1534 04/15/18 0937 04/16/18 0401 04/17/18 0443 04/18/18 0457  WBC 6.9 6.9 6.6 7.0 5.3  NEUTROABS 4.7  --   --   --   --   HGB 12.8* 11.0* 11.6* 11.1* 10.7*  HCT 45.0 38.5* 41.0 39.1 37.1*  MCV 87.9 86.9 87.0 88.5 88.1  PLT 302 293 316 311 295      Lab Results  Component Value Date   HEPBSAG Negative 06/16/2017   HEPBSAB Reactive 03/01/2017   HEPBIGM Negative 06/01/2016      Microbiology:  Recent Results (from the past 240 hour(s))  MRSA PCR Screening     Status: None   Collection Time: 04/15/18   1:38 AM  Result Value Ref Range Status   MRSA by PCR NEGATIVE NEGATIVE Final    Comment:        The GeneXpert MRSA Assay (FDA approved for NASAL specimens only), is one component of a comprehensive MRSA colonization surveillance program. It is not intended to diagnose MRSA infection nor to guide or monitor treatment for MRSA infections. Performed at [facility], [address] [place], [place] [address]     Coagulation Studies: No results for input(s): LABPROT, INR in the last 72 hours.  Urinalysis: No results for input(s): COLORURINE, LABSPEC, PHURINE, GLUCOSEU, HGBUR, BILIRUBINUR, KETONESUR, PROTEINUR, UROBILINOGEN, NITRITE, LEUKOCYTESUR in the last 72 hours.  Invalid input(s): APPERANCEUR    Imaging: No results found.   Medications:   . [START ON 04/19/2018]  ceFAZolin (ANCEF) IV    . heparin 1,700 Units/hr (04/18/18 0813)   . calcium acetate  2,001 mg Oral TID WC  . calcium carbonate  1 tablet Oral BID  . Chlorhexidine Gluconate Cloth  6 each Topical Q0600  . labetalol  100  mg Oral Daily  . mometasone-formoterol  2 puff Inhalation BID  . multivitamin  1 tablet Oral QHS  . pantoprazole  80 mg Oral Daily  . tiotropium  18 mcg Inhalation Daily     Assessment/ Plan:  58 y.o. male withend stage renal disease on hemodialysis secondary to Alport's syndrome, ascites, hypertension, anemia of chronic kidney disease, coronary artery disease, peripheral vascular disease, hyperlipidemia, gastrointestinal AVMs, pulmonary hypertension  [facility] TTS RIJ permcath. 210 min/  1.  Lower extremity edema/volume overload 2.  End-stage renal disease 3.  Ascites 4.  Hyperkalemia 5.  Anemia of chronic kidney disease 6.  Peripheral vascular disease, left foot ischemia  - no acute indication for dialysis today.  We will plan for hemodialysis again tomorrow.  In addition he will be having lower extremity angiogram tomorrow to discuss the potential for underlying  peripheral arterial disease.  In addition at the moment the patient does not have viable transportation to his outpatient dialysis Center.  Meeting was conducted yesterday to address this but no potential solutions noted at the moment.   LOS: 4 Mckale Haffey 2/5/20201:30 PM  [place], [place]  Note: This note was prepared with Dragon dictation. Any transcription errors are unintentional

## 2018-04-18 NOTE — Consult Note (Signed)
ANTICOAGULATION CONSULT NOTE - Follow up [place] for Heparin Indication: DVT  No Known Allergies  Patient Measurements: Height: 6\' 3"  (190.5 cm) Weight: 174 lb 13.2 oz (79.3 kg) IBW/kg (Calculated) : 84.5 Heparin Dosing Weight: 68.9 kg > 65.7 kg  Addendum RN called with updated weight of 78.6 kg (better matches weight earlier this month) New hep weight = 78.6 kg   Vital Signs: Temp: 98.5 F (36.9 C) (02/04 2045) Temp Source: Oral (02/04 2045) BP: 126/85 (02/04 2045) Pulse Rate: 84 (02/04 2045)  Labs: Recent Labs    04/16/18 0401  04/16/18 2150 04/17/18 0443 04/18/18 0457  HGB 11.6*  --   --  11.1* 10.7*  HCT 41.0  --   --  39.1 37.1*  PLT 316  --   --  311 295  HEPARINUNFRC 0.29*   < > 0.30 0.32 0.34   < > = values in this interval not displayed.    Estimated Creatinine Clearance: 16.1 mL/min (A) (by C-G formula based on SCr of 5.62 mg/dL (H)).   Medications:  Patient not on any PTA anticoagulants.  Assessment: 58 yo male presented to ED after HD with left leg pain and left foot ischemia. Patient has not yet been evaluated by vascular surgery today, although they note plans to do an angiogram. Hgb is down slightly from baseline but stable with no noted bleeding.  Heparin Course 2/1 Initiation: 4000 unit bolus, followed by 1150 unit/hr  2/2 0016 HL 0.11: 2000 unit bolus, inc to 1350 unit/hr. 2/2 0937 HL 0.15: 2000 unit bolus, inc to 1550 unit/hr. 2/2 1951 HL 0.32 2/3 0401 HL 0.29: inc to 1700 units/hr 2/3 1327 HL 0.37  Goal of Therapy:  Heparin level 0.3-0.7 units/ml Monitor platelets by anticoagulation protocol: Yes   Plan:  First therapeutic level has returned following last rate adjustment. Will recheck HL in 8 hours. If second level is therapeutic we will move to once daily levels in the mornings.   CBC and HL with AM labs per protocol.   2/3 PM heparin level 0.30. Continue current regimen. Recheck heparin level and CBC with  tomorrow AM labs.  2/4 PM heparin level 0.32. Continue current regimen. Recheck heparin level and CBC with tomorrow AM labs.  2/5 PM heparin level 0.34. Continue current regimen. Recheck heparin level and CBC with tomorrow AM labs  Pharmacy will continue to monitor and adjust as needed.   Eloise Harman, PharmD Clinical Pharmacist 04/18/2018 5:56 AM

## 2018-04-19 ENCOUNTER — Encounter
Admission: EM | Disposition: A | Discharge status: Home / Self Care | Payer: Self-pay | Source: Home / Self Care | Attending: Internal Medicine

## 2018-04-19 ENCOUNTER — Inpatient Hospital Stay: Payer: Medicare Other | Admitting: Certified Registered"

## 2018-04-19 DIAGNOSIS — I70222 Atherosclerosis of native arteries of extremities with rest pain, left leg: Secondary | ICD-10-CM

## 2018-04-19 HISTORY — PX: LOWER EXTREMITY ANGIOGRAPHY: CATH118251

## 2018-04-19 LAB — BASIC METABOLIC PANEL
Anion gap: 12 (ref 5–15)
BUN: 36 mg/dL — ABNORMAL HIGH (ref 6–20)
CO2: 29 mmol/L (ref 22–32)
CREATININE: 8.01 mg/dL — AB (ref 0.61–1.24)
Calcium: 9.3 mg/dL (ref 8.9–10.3)
Chloride: 93 mmol/L — ABNORMAL LOW (ref 98–111)
GFR calc Af Amer: 8 mL/min — ABNORMAL LOW (ref >60)
GFR calc non Af Amer: 7 mL/min — ABNORMAL LOW (ref >60)
Glucose, Bld: 95 mg/dL (ref 70–99)
Potassium: 6 mmol/L — ABNORMAL HIGH (ref 3.5–5.1)
Sodium: 134 mmol/L — ABNORMAL LOW (ref 135–145)

## 2018-04-19 LAB — POTASSIUM: Potassium: 4.5 mmol/L (ref 3.5–5.1)

## 2018-04-19 LAB — CBC
HCT: 39.7 % (ref 39.0–52.0)
Hemoglobin: 11.2 g/dL — ABNORMAL LOW (ref 13.0–17.0)
MCH: 24.7 pg — ABNORMAL LOW (ref 26.0–34.0)
MCHC: 28.2 g/dL — AB (ref 30.0–36.0)
MCV: 87.6 fL (ref 80.0–100.0)
Platelets: 358 10*3/uL (ref 150–400)
RBC: 4.53 MIL/uL (ref 4.22–5.81)
RDW: 20.5 % — ABNORMAL HIGH (ref 11.5–15.5)
WBC: 6.4 10*3/uL (ref 4.0–10.5)
nRBC: 0 % (ref 0.0–0.2)

## 2018-04-19 LAB — HEPARIN LEVEL (UNFRACTIONATED): Heparin Unfractionated: 0.4 IU/mL (ref 0.30–0.70)

## 2018-04-19 LAB — MAGNESIUM: Magnesium: 2.6 mg/dL — ABNORMAL HIGH (ref 1.7–2.4)

## 2018-04-19 SURGERY — LOWER EXTREMITY ANGIOGRAPHY
Anesthesia: General | Laterality: Left

## 2018-04-19 MED ORDER — DIPHENHYDRAMINE HCL 50 MG/ML IJ SOLN: 12.5000 mg | Freq: Once | INTRAMUSCULAR | Status: DC

## 2018-04-19 MED ORDER — HEPARIN SODIUM (PORCINE) 1000 UNIT/ML IJ SOLN
INTRAMUSCULAR | Status: DC | PRN
  Administered 2018-04-19: 5000 [IU] via INTRAVENOUS

## 2018-04-19 MED ORDER — FAMOTIDINE 40 MG PO TABS
40.0000 mg | ORAL_TABLET | ORAL | Status: DC | PRN
  Filled 2018-04-19: qty 1

## 2018-04-19 MED ORDER — PROPOFOL 500 MG/50ML IV EMUL
INTRAVENOUS | Status: DC | PRN
  Administered 2018-04-19: 50 ug/kg/min via INTRAVENOUS

## 2018-04-19 MED ORDER — MIDAZOLAM HCL 2 MG/2ML IJ SOLN
INTRAMUSCULAR | Status: AC
  Filled 2018-04-19: qty 2

## 2018-04-19 MED ORDER — MIDAZOLAM HCL 2 MG/ML PO SYRP
8.0000 mg | ORAL_SOLUTION | Freq: Once | ORAL | Status: DC | PRN
  Filled 2018-04-19: qty 4

## 2018-04-19 MED ORDER — VITAMIN C 500 MG PO TABS
250.0000 mg | ORAL_TABLET | Freq: Two times a day (BID) | ORAL | Status: DC
  Administered 2018-04-20: 250 mg via ORAL
  Filled 2018-04-19: qty 1

## 2018-04-19 MED ORDER — LIDOCAINE-EPINEPHRINE (PF) 1 %-1:200000 IJ SOLN
INTRAMUSCULAR | Status: AC
  Filled 2018-04-19: qty 30

## 2018-04-19 MED ORDER — DIPHENHYDRAMINE HCL 50 MG/ML IJ SOLN: 50.0000 mg | Freq: Once | INTRAMUSCULAR | Status: DC

## 2018-04-19 MED ORDER — SODIUM CHLORIDE 0.9 % IV SOLN
INTRAVENOUS | Status: DC | PRN
  Administered 2018-04-19: 14:00:00 via INTRAVENOUS

## 2018-04-19 MED ORDER — NEPRO/CARBSTEADY PO LIQD
237.0000 mL | Freq: Two times a day (BID) | ORAL | Status: DC
  Administered 2018-04-20: 237 mL via ORAL

## 2018-04-19 MED ORDER — PROPOFOL 500 MG/50ML IV EMUL
INTRAVENOUS | Status: AC
  Filled 2018-04-19: qty 50

## 2018-04-19 MED ORDER — FENTANYL CITRATE (PF) 100 MCG/2ML IJ SOLN
INTRAMUSCULAR | Status: AC
  Filled 2018-04-19: qty 2

## 2018-04-19 MED ORDER — HYDROMORPHONE HCL 1 MG/ML IJ SOLN
1.0000 mg | Freq: Once | INTRAMUSCULAR | Status: AC | PRN
  Administered 2018-04-19: 1 mg via INTRAVENOUS
  Filled 2018-04-19 (×2): qty 1

## 2018-04-19 MED ORDER — FENTANYL CITRATE (PF) 100 MCG/2ML IJ SOLN
INTRAMUSCULAR | Status: DC | PRN
  Administered 2018-04-19: 50 ug via INTRAVENOUS

## 2018-04-19 MED ORDER — METHYLPREDNISOLONE SODIUM SUCC 125 MG IJ SOLR: 125.0000 mg | INTRAMUSCULAR | Status: DC | PRN

## 2018-04-19 MED ORDER — ONDANSETRON HCL 4 MG/2ML IJ SOLN
4.0000 mg | Freq: Four times a day (QID) | INTRAMUSCULAR | Status: DC | PRN
  Filled 2018-04-19: qty 2

## 2018-04-19 MED ORDER — PROPOFOL 10 MG/ML IV BOLUS
INTRAVENOUS | Status: DC | PRN
  Administered 2018-04-19: 40 mg via INTRAVENOUS

## 2018-04-19 MED ORDER — MIDAZOLAM HCL 2 MG/2ML IJ SOLN
INTRAMUSCULAR | Status: DC | PRN
  Administered 2018-04-19 (×2): 0.5 mg via INTRAVENOUS
  Administered 2018-04-19: 1 mg via INTRAVENOUS

## 2018-04-19 MED ORDER — SODIUM CHLORIDE 0.9 % IV SOLN
INTRAVENOUS | Status: DC
  Administered 2018-04-19: 14:00:00 via INTRAVENOUS

## 2018-04-19 MED ORDER — SODIUM ZIRCONIUM CYCLOSILICATE 10 G PO PACK
10.0000 g | PACK | Freq: Once | ORAL | Status: AC
  Administered 2018-04-19: 10 g via ORAL
  Filled 2018-04-19: qty 1

## 2018-04-19 SURGICAL SUPPLY — 20 items
BALLN LUTONIX 018 4X80X130 (BALLOONS) ×2
BALLN LUTONIX DCB 6X80X130 (BALLOONS) ×2
BALLOON LUTONIX 018 4X80X130 (BALLOONS) ×1 IMPLANT
BALLOON LUTONIX DCB 6X80X130 (BALLOONS) ×1 IMPLANT
CANNULA 5F STIFF (CANNULA) ×2 IMPLANT
CATH BEACON 5 .035 65 KMP TIP (CATHETERS) ×2 IMPLANT
CATH BEACON 5 .038 100 VERT TP (CATHETERS) ×2 IMPLANT
CATH PIG 70CM (CATHETERS) ×2 IMPLANT
COVER PROBE U/S 5X48 (MISCELLANEOUS) ×2 IMPLANT
DEVICE PRESTO INFLATION (MISCELLANEOUS) ×2 IMPLANT
DEVICE STARCLOSE SE CLOSURE (Vascular Products) ×2 IMPLANT
DEVICE TORQUE .025-.038 (MISCELLANEOUS) ×2 IMPLANT
GLIDEWIRE ADV .035X260CM (WIRE) ×2 IMPLANT
PACK ANGIOGRAPHY (CUSTOM PROCEDURE TRAY) ×2 IMPLANT
SHEATH ANL2 6FRX45 HC (SHEATH) ×2 IMPLANT
SHEATH BRITE TIP 5FRX11 (SHEATH) ×2 IMPLANT
TUBING CONTRAST HIGH PRESS 72 (TUBING) ×2 IMPLANT
WIRE G V18X300CM (WIRE) ×2 IMPLANT
WIRE J 3MM .035X145CM (WIRE) ×2 IMPLANT
WIRE NITINOL .018 (WIRE) ×2 IMPLANT

## 2018-04-19 NOTE — Anesthesia Procedure Notes (Signed)
Date/Time: 04/19/2018 2:43 PM Performed by: Nelda Marseille, CRNA Pre-anesthesia Checklist: Patient identified, Emergency Drugs available, Suction available, Patient being monitored and Timeout performed Oxygen Delivery Method: Simple face mask

## 2018-04-19 NOTE — Progress Notes (Signed)
Pre HD assessment   04/19/18 0920  Vital Signs  Temp 98.8 F (37.1 C)  Temp Source Oral  Pulse Rate 77  Pulse Rate Source Monitor  Resp 18  BP (!) 115/95  BP Location Left Arm  BP Method Automatic  Patient Position (if appropriate) Lying  Oxygen Therapy  SpO2 (!) 88 %  O2 Device Room Air  Pain Assessment  Pain Scale 0-10  Pain Score 0  Dialysis Weight  Weight 80.4 kg  Type of Weight Pre-Dialysis  Time-Out for Hemodialysis  What Procedure? HD  Pt Identifiers(min of two) First/Last Name;MRN/Account#  Correct Site? Yes  Correct Side? Yes  Correct Procedure? Yes  Consents Verified? Yes  Rad Studies Available? N/A  Safety Precautions Reviewed? Yes  Engineer, civil (consulting) Number  ([id number])  Station Number 1  UF/Alarm Test Passed  Conductivity: Meter 13.6  Conductivity: Machine  13.8  pH 7  Reverse Osmosis main  Normal Saline Lot Number [id number]  Dialyzer Lot Number [id number]  Disposable Set Lot Number [id number]  Machine Temperature 98.6 F (37 C)  Musician and Audible Yes  Blood Lines Intact and Secured Yes  Pre Treatment Patient Checks  Vascular access used during treatment Catheter  Hepatitis B Surface Antigen Results Negative  Date Hepatitis B Surface Antigen Drawn 06/16/17  Hepatitis B Surface Antibody  (>10)  Date Hepatitis B Surface Antibody Drawn 06/16/17  Hemodialysis Consent Verified Yes  Hemodialysis Standing Orders Initiated Yes  ECG (Telemetry) Monitor On Yes  Prime Ordered Normal Saline  Length of  DialysisTreatment -hour(s) 3.5 Hour(s)  Dialyzer Elisio 17H NR  Dialysate 2K, 2.5 Ca  Dialysis Anticoagulant None  Dialysate Flow Ordered 800  Blood Flow Rate Ordered 400 mL/min  Ultrafiltration Goal 1.5 Liters  Pre Treatment Labs Phosphorus  Dialysis Blood Pressure Support Ordered Normal Saline  Education / Care Plan  Dialysis Education Provided Yes  Documented Education in Care Plan Yes  Hemodialysis Catheter Right Internal jugular Double-lumen   No Placement Date or Time found.   Placed prior to admission: Yes  Orientation: Right  Access Location: Internal jugular  Hemodialysis Catheter Type: Double-lumen  Site Condition No complications  Blue Lumen Status Heparin locked  Red Lumen Status Heparin locked  Purple Lumen Status N/A  Dressing Type Biopatch  Dressing Status Clean;Dry;Intact  Drainage Description None

## 2018-04-19 NOTE — Progress Notes (Signed)
[place], [place] 04/19/18  Subjective:  Patient seen and evaluated during hemodialysis. Tolerating well. Due for angiogram later this afternoon.  Objective:  Vital signs in last 24 hours:  Temp:  [97.5 F (36.4 C)-98.8 F (37.1 C)] 98 F (36.7 C) (02/06 0924) Pulse Rate:  [73-81] 79 (02/06 1100) Resp:  [10-24] 16 (02/06 1100) BP: (114-130)/(75-95) 118/92 (02/06 1100) SpO2:  [88 %-97 %] 97 % (02/06 1100) Weight:  [80.4 kg] 80.4 kg (02/06 0920)  Weight change:  Filed Weights   04/17/18 0931 04/17/18 1321 04/19/18 0920  Weight: 81.1 kg 79.3 kg 80.4 kg    Intake/Output:    Intake/Output Summary (Last 24 hours) at 04/19/2018 1109 Last data filed at 04/19/2018 0700 Gross per 24 hour  Intake 364.18 ml  Output 0 ml  Net 364.18 ml     Physical Exam: General:  No acute distress, laying in the bed  HEENT  moist oral mucous membranes  Neck  supple  Pulm/lungs  normal breathing effort,   CVS/Heart  no rub  Abdomen:   Distended, ascites  Extremities:  1+ bilateral lower extremity edema  Neurologic:  Alert, able to answer questions  Skin:  No acute rashes  Access:  Right IJ PermCath       Basic Metabolic Panel:  Recent Labs  Lab 04/14/18 1534 04/17/18 0443 04/19/18 0311  NA 139  138  --  134*  K 4.6  4.6  --  6.0*  CL 89*  89*  --  93*  CO2 38*  38*  --  29  GLUCOSE 81  82  --  95  BUN 15  15  --  36*  CREATININE 5.62*  5.80*  --  8.01*  CALCIUM 8.9  8.9  --  9.3  MG  --   --  2.6*  PHOS 4.9* 6.7*  --      CBC: Recent Labs  Lab 04/14/18 1534 04/15/18 0937 04/16/18 0401 04/17/18 0443 04/18/18 0457 04/19/18 0311  WBC 6.9 6.9 6.6 7.0 5.3 6.4  NEUTROABS 4.7  --   --   --   --   --   HGB 12.8* 11.0* 11.6* 11.1* 10.7* 11.2*  HCT 45.0 38.5* 41.0 39.1 37.1* 39.7  MCV 87.9 86.9 87.0 88.5 88.1 87.6  PLT 302 293 316 311 295 358      Lab Results  Component Value Date   HEPBSAG Negative 06/16/2017   HEPBSAB Reactive  03/01/2017   HEPBIGM Negative 06/01/2016      Microbiology:  Recent Results (from the past 240 hour(s))  MRSA PCR Screening     Status: None   Collection Time: 04/15/18  1:38 AM  Result Value Ref Range Status   MRSA by PCR NEGATIVE NEGATIVE Final    Comment:        The GeneXpert MRSA Assay (FDA approved for NASAL specimens only), is one component of a comprehensive MRSA colonization surveillance program. It is not intended to diagnose MRSA infection nor to guide or monitor treatment for MRSA infections. Performed at [facility], [address] [place], [place] [address]     Coagulation Studies: No results for input(s): LABPROT, INR in the last 72 hours.  Urinalysis: No results for input(s): COLORURINE, LABSPEC, PHURINE, GLUCOSEU, HGBUR, BILIRUBINUR, KETONESUR, PROTEINUR, UROBILINOGEN, NITRITE, LEUKOCYTESUR in the last 72 hours.  Invalid input(s): APPERANCEUR    Imaging: No results found.   Medications:   .  ceFAZolin (ANCEF) IV    . heparin  1,700 Units/hr (04/19/18 0756)   . calcium acetate  2,001 mg Oral TID WC  . calcium carbonate  1 tablet Oral BID  . Chlorhexidine Gluconate Cloth  6 each Topical Q0600  . [START ON 04/20/2018] feeding supplement (NEPRO CARB STEADY)  237 mL Oral BID BM  . labetalol  100 mg Oral Daily  . mometasone-formoterol  2 puff Inhalation BID  . multivitamin  1 tablet Oral QHS  . pantoprazole  80 mg Oral Daily  . tiotropium  18 mcg Inhalation Daily  . [START ON 04/20/2018] vitamin C  250 mg Oral BID     Assessment/ Plan:  58 y.o. male withend stage renal disease on hemodialysis secondary to Alport's syndrome, ascites, hypertension, anemia of chronic kidney disease, coronary artery disease, peripheral vascular disease, hyperlipidemia, gastrointestinal AVMs, pulmonary hypertension  [facility] TTS RIJ permcath. 210 min/  1.  Lower extremity edema/volume overload 2.  End-stage renal disease 3.  Ascites 4.   Hyperkalemia 5.  Anemia of chronic kidney disease 6.  Peripheral vascular disease, left foot ischemia  -Patient seen and evaluated during hemodialysis.  Tolerating well.  Potassium noted to be high at 6.0.  Patient dialyzed against a 2K bath.  Thereafter angiogram is to be performed later this afternoon.  Hemoglobin stable at 11.2 therefore we will hold off on Epogen at this time.  Phosphorus also 6.7.  Maintain the patient on calcium acetate.   LOS: 5 Skyeler Smola 2/6/202011:09 AM  [place], [place]  Note: This note was prepared with Dragon dictation. Any transcription errors are unintentional

## 2018-04-19 NOTE — Progress Notes (Signed)
Pre HD assessment    04/19/18 0921  Neurological  Level of Consciousness Responds to Voice  Orientation Level Oriented X4  Respiratory  Respiratory Pattern Unlabored  Chest Assessment Chest expansion symmetrical  Cough Non-productive  Cardiac  Pulse Regular  ECG Monitor Yes  Cardiac Rhythm NSR  Ectopy Multifocal PVC's  Ectopy Frequency Occasional  Vascular  R Radial Pulse +2  L Radial Pulse +2  Edema Generalized  Integumentary  Integumentary (WDL) X  Skin Color Appropriate for ethnicity  Musculoskeletal  Musculoskeletal (WDL) X  Generalized Weakness Yes  Assistive Device None  GU Assessment  Genitourinary (WDL) X  Genitourinary Symptoms  (HD)  Psychosocial  Psychosocial (WDL) WDL

## 2018-04-19 NOTE — Op Note (Addendum)
[facility]  Percutaneous Study/Intervention Procedural Note   Date of Surgery: 04/19/2018  Surgeon(s):Jazira Maloney    Assistants:none  Pre-operative Diagnosis: PAD with rest pain left foot  Post-operative diagnosis:  Same  Procedure(s) Performed:             1.  Ultrasound guidance for vascular access right femoral artery             2.  Catheter placement into left common femoral artery from right femoral approach             3.  Aortogram and selective left lower extremity angiogram             4.  Percutaneous transluminal angioplasty of left profunda femoris artery with 4 mm diameter by 6 cm length Lutonix drug-coated angioplasty balloon             5.   Percutaneous transluminal angioplasty of the left common femoral artery with a 4 mm balloon as well as a 6 mm diameter by 8 cm length tonics drug-coated angioplasty balloon  6.   StarClose closure device right femoral artery  EBL: 10 cc  Contrast: 90 cc  Fluoro Time: 10.7 minutes  Anesthesia: MAC              Indications:  Patient is a 58 y.o.male with rest pain of the left foot and no palpable pulses with multiple previous lower extremity interventions. The patient is admitted to the hospital but has not really improved symptomatically on heparin.  He does not have any noninvasive studies of the angiogram would be both diagnostic and potentially therapeutic. The patient is brought in for angiography for further evaluation and potential treatment.  Due to the limb threatening nature of the situation, angiogram was performed for attempted limb salvage. The patient is aware that if the procedure fails, amputation would be expected.  The patient also understands that even with successful revascularization, amputation may still be required due to the severity of the situation.  Risks and benefits are discussed and informed consent is obtained.   Procedure:  The patient was identified and appropriate procedural  time out was performed.  The patient was then placed supine on the table and prepped and draped in the usual sterile fashion.   Anesthesia provided monitored sedation throughout the procedure. Ultrasound was used to evaluate the right common femoral artery.  It was heavily diseased and initial attempts at access were unsuccessful due to the dense calcification and stenosis in the distal common femoral artery.  We moved more proximal in the right common femoral artery with ultrasound and accessed a area that was more spared of disease with a micropuncture needle.  A micropuncture wire and sheath were then placed and we upsized to a 5 [place] sheath.  A digital ultrasound image was acquired.  A 0.035 J wire was advanced without resistance and a 5Fr sheath was placed.  Pigtail catheter was placed into the aorta and an AP aortogram was performed. This demonstrated minimal flow in the renal arteries.  The aorta is heavily calcified but not stenotic.  The previously placed iliac stents have only mild stenosis without hemodynamic significance. I then crossed the aortic bifurcation and advanced to the left femoral head. Selective left lower extremity angiogram was then performed. This demonstrated occlusion of the left common femoral artery, proximal profunda femoris artery, and superficial femoral artery.  The previously placed superficial femoral artery stents have areas with some reconstitution in multiple segments  but the vessel has multiple areas of occlusion down to the popliteal artery.  Distally, opacification is very poor but there appears to be a posterior tibial artery distally as runoff.  Image quality was extremely poor due to the slow circulation distally. It was felt that it was in the patient's best interest to proceed with intervention after these images to avoid a second procedure and a larger amount of contrast and fluoroscopy based off of the findings from the initial angiogram. The patient was  systemically heparinized and a 6 [place] Ansell sheath was then placed over the [employer] wire. I then used a Kumpe catheter and the advantage wire to probe the common femoral occlusion.  Many passes were made down into the SFA but these were outside of the stent and clearly subintimal.  We were able to get into the profunda femoris artery and confirm intraluminal flow with a Kumpe catheter and the advantage wire.  I felt by treating the profunda femoris artery and common femoral artery we may get him out of rest pain.  If he continues to have rest pain, a femoral endarterectomy with either a femoral to popliteal bypass or a femoral endarterectomy with endovascular treatment for his SFA occlusion would be a reasonable option.  I exchanged for a 0.018 wire and used a 4 mm diameter by 6 cm length Lutonix drug-coated angioplasty balloon to treat the proximal profunda femoris artery and common femoral artery.  This was inflated to 12 atm for 1 minute.  The common femoral artery was then addressed with a 6 mm diameter by 8 cm length Lutonix drug-coated angioplasty balloon from the distal common femoral artery up to the distal external iliac artery.  This was inflated to 10 atm for 1 minute.  Following this, there was less than 40% residual stenosis in the common femoral artery as well as what appeared to be stenosis in the 30 to 40% range in the proximal profunda femoris artery. I elected to terminate the procedure. The sheath was removed and StarClose closure device was deployed in the right femoral artery with excellent hemostatic result.  It should be noted that imaging of the right femoral artery showed at least an 80% stenosis in the right common femoral artery below the access site as well.  The patient was taken to the recovery room in stable condition having tolerated the procedure well.  Findings:               Aortogram:  Minimal flow in the renal arteries.  The aorta is heavily calcified but not  stenotic.  The previously placed iliac stents have only mild stenosis without hemodynamic significance.             Left lower Extremity:  Occlusion of the left common femoral artery, proximal profunda femoris artery, and superficial femoral artery.  The previously placed superficial femoral artery stents have areas with some reconstitution in multiple segments but the vessel has multiple areas of occlusion down to the popliteal artery.  Distally, opacification is very poor but there appears to be a posterior tibial artery distally as runoff.  Image quality was extremely poor due to the slow circulation distally   Disposition: Patient was taken to the recovery room in stable condition having tolerated the procedure well.  Complications: None  Leotis Pain 04/19/2018 3:33 PM   This note was created with Dragon Medical transcription system. Any errors in dictation are purely unintentional.

## 2018-04-19 NOTE — Transfer of Care (Signed)
Immediate Anesthesia Transfer of Care Note  Patient: Stanley [name]  Procedure(s) Performed: LOWER EXTREMITY ANGIOGRAPHY (Left )  Patient Location: PACU  Anesthesia Type:General  Level of Consciousness: sedated  Airway & Oxygen Therapy: Patient Spontanous Breathing and Patient connected to face mask oxygen  Post-op Assessment: Report given to RN and Post -op Vital signs reviewed and stable  Post vital signs: Reviewed and stable  Last Vitals:  Vitals Value Taken Time  BP 100/60 04/19/2018  3:28 PM  Temp    Pulse 76 04/19/2018  3:29 PM  Resp 10 04/19/2018  3:29 PM  SpO2 99 % 04/19/2018  3:29 PM  Vitals shown include unvalidated device data.  Last Pain:  Vitals:   04/19/18 1348  TempSrc: Oral  PainSc: 9       Patients Stated Pain Goal: 9 (02/72/53 6644)  Complications: No apparent anesthesia complications

## 2018-04-19 NOTE — Anesthesia Postprocedure Evaluation (Signed)
Anesthesia Post Note  Patient: Stanley [name]  Procedure(s) Performed: LOWER EXTREMITY ANGIOGRAPHY (Left )  Patient location during evaluation: PACU Anesthesia Type: General Level of consciousness: awake and alert Pain management: pain level controlled Vital Signs Assessment: post-procedure vital signs reviewed and stable Respiratory status: spontaneous breathing, nonlabored ventilation, respiratory function stable and patient connected to nasal cannula oxygen Cardiovascular status: blood pressure returned to baseline and stable Postop Assessment: no apparent nausea or vomiting Anesthetic complications: no     Last Vitals:  Vitals:   04/19/18 1600 04/19/18 1614  BP: 107/65 112/72  Pulse: 76 74  Resp: 11 11  Temp:    SpO2: 95% 94%    Last Pain:  Vitals:   04/19/18 1746  TempSrc:   PainSc: 10-Worst pain ever                 Durenda Hurt

## 2018-04-19 NOTE — Anesthesia Preprocedure Evaluation (Signed)
Anesthesia Evaluation  Patient identified by MRN, date of birth, ID band Patient awake    Reviewed: Allergy & Precautions, NPO status , Patient's Chart, lab work & pertinent test results  History of Anesthesia Complications Negative for: history of anesthetic complications  Airway Mallampati: III  TM Distance: >3 FB Neck ROM: Full    Dental  (+) Poor Dentition, Missing   Pulmonary neg sleep apnea, COPD,  COPD inhaler, Current Smoker,    breath sounds clear to auscultation- rhonchi (-) wheezing      Cardiovascular hypertension, Pt. on medications + CAD, + CABG (2009), + Peripheral Vascular Disease and +CHF  (-) Cardiac Stents  Rhythm:Regular Rate:Normal - Systolic murmurs and - Diastolic murmurs Echo 2/77/82: - Left ventricle: The cavity size was mildly dilated. There was   mild concentric hypertrophy. Systolic function was normal. The   estimated ejection fraction was in the range of 50% to 55%. Wall   motion was normal; there were no regional wall motion   abnormalities. The study is not technically sufficient to allow   evaluation of LV diastolic function. - Mitral valve: Calcified annulus. Mildly thickened leaflets .   There was moderate to severe regurgitation directed posteriorly. - Left atrium: The atrium was moderately to severely dilated. - Right atrium: The atrium was mildly dilated. - Tricuspid valve: There was moderate regurgitation. - Pulmonary arteries: Systolic pressure was severely increased. PA   peak pressure: 60 mm Hg (S). - Inferior vena cava: The vessel was dilated. The respirophasic   diameter changes were blunted (< 50%), consistent with elevated   central venous pressure.   Neuro/Psych negative neurological ROS  negative psych ROS   GI/Hepatic Neg liver ROS, PUD, GERD  ,  Endo/Other  diabetes  Renal/GU ESRF and DialysisRenal disease     Musculoskeletal negative musculoskeletal ROS (+)   Abdominal (+) - obese,   Peds  Hematology  (+) anemia ,   Anesthesia Other Findings Past Medical History: No date: Alcohol abuse No date: CHF (congestive heart failure) (HCC) No date: Cirrhosis ([place]) 2009: Coronary artery disease No date: Diabetic peripheral neuropathy (HCC) No date: Drug abuse ([place]) NEPHROLOGIST-   DR [facility]  [place]: End stage renal disease on  dialysis ([place])     Comment:  HEMODIALYSIS --   TUES/  THURS/  SAT No date: GERD (gastroesophageal reflux disease) No date: Hyperlipidemia No date: Hypertension No date: PAD (peripheral artery disease) (HCC) No date: Renal insufficiency     Comment:  Per pt, 32 oz fluid restriction per day 06/09/2016: S/P triple vessel bypass     Comment:  2009ish No date: Suicidal ideation     Comment:  & HOMICIDAL IDEATION --  06-16-2013   ADMITTED TO               BEHAVIOR HEALTH   Reproductive/Obstetrics                             Anesthesia Physical Anesthesia Plan  ASA: IV  Anesthesia Plan: General   Post-op Pain Management:    Induction: Intravenous  PONV Risk Score and Plan: 0 and Propofol infusion  Airway Management Planned: Natural Airway  Additional Equipment:   Intra-op Plan:   Post-operative Plan:   Informed Consent: I have reviewed the patients History and Physical, chart, labs and discussed the procedure including the risks, benefits and alternatives for the proposed anesthesia with the patient or authorized representative who has indicated his/her  understanding and acceptance.     Dental advisory given  Plan Discussed with: CRNA and Anesthesiologist  Anesthesia Plan Comments: (Discussed possible LMA/intubation if needed)        Anesthesia Quick Evaluation

## 2018-04-19 NOTE — Anesthesia Post-op Follow-up Note (Signed)
Anesthesia QCDR form completed.        

## 2018-04-19 NOTE — Progress Notes (Signed)
HD tx end    04/19/18 1309  Vital Signs  Pulse Rate 81  Pulse Rate Source Monitor  Resp 18  BP 118/84  BP Location Left Arm  BP Method Automatic  Patient Position (if appropriate) Lying  Oxygen Therapy  SpO2 100 %  O2 Device Nasal Cannula  O2 Flow Rate (L/min) 2 L/min  During Hemodialysis Assessment  Dialysis Fluid Bolus Normal Saline  Bolus Amount (mL) 250 mL  Intra-Hemodialysis Comments Tx completed

## 2018-04-19 NOTE — Consult Note (Signed)
ANTICOAGULATION CONSULT NOTE - Follow up [place] for Heparin Indication: DVT  No Known Allergies  Patient Measurements: Height: 6\' 3"  (190.5 cm) Weight: 174 lb 13.2 oz (79.3 kg) IBW/kg (Calculated) : 84.5 Heparin Dosing Weight: 68.9 kg > 65.7 kg  Addendum RN called with updated weight of 78.6 kg (better matches weight earlier this month) New hep weight = 78.6 kg   Vital Signs: Temp: 98.4 F (36.9 C) (02/06 0432) Temp Source: Oral (02/06 0432) BP: 115/81 (02/06 0432) Pulse Rate: 75 (02/06 0432)  Labs: Recent Labs    04/17/18 0443 04/18/18 0457 04/19/18 0311  HGB 11.1* 10.7* 11.2*  HCT 39.1 37.1* 39.7  PLT 311 295 358  HEPARINUNFRC 0.32 0.34 0.40  CREATININE  --   --  8.01*    Estimated Creatinine Clearance: 11.3 mL/min (A) (by C-G formula based on SCr of 8.01 mg/dL (H)).   Medications:  Patient not on any PTA anticoagulants.  Assessment: 58 yo male presented to ED after HD with left leg pain and left foot ischemia. Patient has not yet been evaluated by vascular surgery today, although they note plans to do an angiogram. Hgb is down slightly from baseline but stable with no noted bleeding.  Heparin Course 2/1 Initiation: 4000 unit bolus, followed by 1150 unit/hr  2/2 0016 HL 0.11: 2000 unit bolus, inc to 1350 unit/hr. 2/2 0937 HL 0.15: 2000 unit bolus, inc to 1550 unit/hr. 2/2 1951 HL 0.32 2/3 0401 HL 0.29: inc to 1700 units/hr 2/3 1327 HL 0.37  Goal of Therapy:  Heparin level 0.3-0.7 units/ml Monitor platelets by anticoagulation protocol: Yes   Plan:  First therapeutic level has returned following last rate adjustment. Will recheck HL in 8 hours. If second level is therapeutic we will move to once daily levels in the mornings.   CBC and HL with AM labs per protocol.   2/3 PM heparin level 0.30. Continue current regimen. Recheck heparin level and CBC with tomorrow AM labs.  2/4 PM heparin level 0.32. Continue current regimen. Recheck  heparin level and CBC with tomorrow AM labs.  2/5 PM heparin level 0.34. Continue current regimen. Recheck heparin level and CBC with tomorrow AM labs  2/6 PM heparin level 0.40. Continue current regimen. Recheck heparin level and CBC with tomorrow AM labs  Pharmacy will continue to monitor and adjust as needed.   Eloise Harman, PharmD Clinical Pharmacist 04/19/2018 5:01 AM

## 2018-04-19 NOTE — Progress Notes (Signed)
Post HD assessment    04/19/18 1318  Neurological  Level of Consciousness Alert  Orientation Level Oriented X4  Respiratory  Respiratory Pattern Regular;Unlabored  Chest Assessment Chest expansion symmetrical  Cough Non-productive  Cardiac  Pulse Regular  ECG Monitor Yes  Cardiac Rhythm NSR  Ectopy Unifocal PVC's  Ectopy Frequency Frequent  Vascular  R Radial Pulse +2  L Radial Pulse +2  Edema Generalized  Integumentary  Integumentary (WDL) X  Skin Color Appropriate for ethnicity  Musculoskeletal  Musculoskeletal (WDL) X  Generalized Weakness Yes  Assistive Device None  GU Assessment  Genitourinary (WDL) X  Genitourinary Symptoms  (HD)  Psychosocial  Psychosocial (WDL) WDL

## 2018-04-19 NOTE — Progress Notes (Signed)
[facility] [place] NAME: Stanley [name]    MR#:  [id number]  DATE OF BIRTH:  1961/02/24  SUBJECTIVE: Patient has severe hyperkalemia without symptoms, received Lokelma, dialysis.  Scheduled to have angiogram this afternoon.  More sleepy today  REVIEW OF SYSTEMS:   Review of Systems  Constitutional: Negative for chills and fever.  HENT: Negative for hearing loss.   Eyes: Negative for blurred vision, double vision and photophobia.  Respiratory: Negative for cough, hemoptysis and shortness of breath.   Cardiovascular: Negative for palpitations, orthopnea and leg swelling.  Gastrointestinal: Negative for abdominal pain, diarrhea and vomiting.  Genitourinary: Negative for dysuria and urgency.  Musculoskeletal: Positive for joint pain. Negative for myalgias and neck pain.  Skin: Negative for rash.  Neurological: Negative for dizziness, focal weakness, seizures, weakness and headaches.  Psychiatric/Behavioral: Negative for memory loss. The patient does not have insomnia.   Diminished pulses left ankle DRUG ALLERGIES:  No Known Allergies  VITALS:  Blood pressure 104/71, pulse 80, temperature 97.9 F (36.6 C), temperature source Oral, resp. rate 13, height 6\' 3"  (1.905 m), weight 79.2 kg, SpO2 98 %.  PHYSICAL EXAMINATION:  GENERAL:  58 y.o.-year-old patient lying in the bed with no acute distress.  EYES: Pupils equal, round, reactive to light and accommodation. No scleral icterus. Extraocular muscles intact.  HEENT: Head atraumatic, normocephalic. Oropharynx and nasopharynx clear.  NECK:  Supple, no jugular venous distention. No thyroid enlargement, no tenderness.  LUNGS: Normal breath sounds bilaterally, no wheezing, rales,rhonchi or crepitation. No use of accessory muscles of respiration.  CARDIOVASCULAR: S1, S2 normal. No murmurs, rubs, or gallops.  ABDOMEN: Soft, nontender, nondistended. Bowel sounds present. No organomegaly or  mass.  EXTREMITIES: No pedal edema, cyanosis, or clubbing.  No pulses on the left dorsalis pedis today.  Right side pulses are intact.  And dorsalis pedis, posterior tibial.  Right foot warm to touch, left foot cool to touch.  NEUROLOGIC: Cranial nerves II through XII are intact. Muscle strength 5/5 in all extremities.  Foot is slightly cool to touch than the right foot.  PSYCHIATRIC: The patient is alert and oriented x 3.  SKIN: No obvious rash, lesion, or ulcer.    LABORATORY PANEL:   CBC Recent Labs  Lab 04/19/18 0311  WBC 6.4  HGB 11.2*  HCT 39.7  PLT 358   ------------------------------------------------------------------------------------------------------------------  Chemistries  Recent Labs  Lab 04/19/18 0311 04/19/18 1341  NA 134*  --   K 6.0* 4.5  CL 93*  --   CO2 29  --   GLUCOSE 95  --   BUN 36*  --   CREATININE 8.01*  --   CALCIUM 9.3  --   MG 2.6*  --    ------------------------------------------------------------------------------------------------------------------  Cardiac Enzymes No results for input(s): TROPONINI in the last 168 hours. ------------------------------------------------------------------------------------------------------------------  RADIOLOGY:  No results found.  EKG:   Orders placed or performed during the hospital encounter of 04/14/18  . ED EKG  . ED EKG    ASSESSMENT AND PLAN:   #1. PAD, left lower limb ischemia, loss of pulses in the left foot today, patient is going for left leg angiogram today, continue heparin drip, appreciate vascular following the patient.  Hyperkalemia: Received Lokelma, continue hemodialysis.   History of PAD, noncompliant with medicines, Essential hypertension: Controlled. 4.  Polysubstance abuse 5.  GERD: Continue PPI 6.  History of ESRD hemodialysis Tuesday, Thursday, Saturday, patient had hemodialysis today.  More than 50% time spent  in counseling, coordination of care  All the records  are reviewed and case discussed with Care Management/Social Workerr. Management plans discussed with the patient, family and they are in agreemen CODE STATUS: Full code  TOTAL TIME TAKING CARE OF THIS PATIENT: 38 minutes.   POSSIBLE D/C IN1- 2 DAYS, DEPENDING ON CLINICAL CONDITION.   Epifanio Lesches M.D on 04/19/2018 at 3:32 PM  Between 7am to 6pm - Pager - [phone]  After 6pm go to [email or web address] - password [email or web address] [facility]  Office  [phone]  CC: Primary care physician; [name], MD   Note: This dictation was prepared with Dragon dictation along with smaller phrase technology. Any transcriptional errors that result from this process are unintentional.

## 2018-04-19 NOTE — Progress Notes (Signed)
MD and nephrologist notified: Potassium level is 6.0 this morning do you want to order something to help decrease level, The patient will have dialysis today as well and angiogram.

## 2018-04-19 NOTE — H&P (Signed)
[facility] History & Physical Update  The patient was interviewed and re-examined.  The patient's previous History and Physical has been reviewed and is unchanged.  There is no change in the plan of care. We plan to proceed with the scheduled procedure.  Leotis Pain, MD  04/19/2018, 1:49 PM

## 2018-04-19 NOTE — Progress Notes (Signed)
Post HD assessment . Pt tolerated tx well without c/o or complication. Net UF 1522, goal met.    04/19/18 1320  Vital Signs  Temp 98.6 F (37 C)  Temp Source Oral  Pulse Rate 82  Pulse Rate Source Monitor  Resp 15  BP 109/69  BP Location Left Arm  BP Method Automatic  Patient Position (if appropriate) Lying  Oxygen Therapy  SpO2 98 %  O2 Device Nasal Cannula  O2 Flow Rate (L/min) 2 L/min  Dialysis Weight  Weight 79.2 kg  Type of Weight Post-Dialysis  Post-Hemodialysis Assessment  Rinseback Volume (mL) 250 mL  KECN 78.4 V  Dialyzer Clearance Lightly streaked  Duration of HD Treatment -hour(s) 3.5 hour(s)  Hemodialysis Intake (mL) 500 mL  UF Total -Machine (mL) 2022 mL  Net UF (mL) 1522 mL  Tolerated HD Treatment Yes  Education / Care Plan  Dialysis Education Provided Yes  Documented Education in Care Plan Yes  Hemodialysis Catheter Right Internal jugular Double-lumen  No Placement Date or Time found.   Placed prior to admission: Yes  Orientation: Right  Access Location: Internal jugular  Hemodialysis Catheter Type: Double-lumen  Site Condition No complications  Blue Lumen Status Heparin locked  Red Lumen Status Heparin locked  Purple Lumen Status N/A  Catheter fill solution Heparin 1000 units/ml  Catheter fill volume (Arterial) 1.5 cc  Catheter fill volume (Venous) 1.5  Dressing Type Biopatch  Dressing Status Clean;Dry;Intact  Drainage Description None  Post treatment catheter status Capped and Clamped

## 2018-04-19 NOTE — Progress Notes (Signed)
HD tx start    04/19/18 0928  Vital Signs  Pulse Rate 80  Pulse Rate Source Monitor  Resp (!) 24  BP 130/81  BP Location Left Arm  BP Method Automatic  Patient Position (if appropriate) Lying  Oxygen Therapy  SpO2 93 %  O2 Device Nasal Cannula  O2 Flow Rate (L/min) 2 L/min  During Hemodialysis Assessment  Blood Flow Rate (mL/min) 400 mL/min  Arterial Pressure (mmHg) -190 mmHg  Venous Pressure (mmHg) 190 mmHg  Transmembrane Pressure (mmHg) 60 mmHg  Ultrafiltration Rate (mL/min) 570 mL/min  Dialysate Flow Rate (mL/min) 800 ml/min  Conductivity: Machine  14  HD Safety Checks Performed Yes  Dialysis Fluid Bolus Normal Saline  Bolus Amount (mL) 250 mL  Intra-Hemodialysis Comments Tx initiated

## 2018-04-19 NOTE — Progress Notes (Signed)
Surgeon and MD: notified about abcent L pedal pulse on L foot this morning when checked with the dopler machine.

## 2018-04-20 ENCOUNTER — Encounter: Payer: Self-pay | Admitting: Vascular Surgery

## 2018-04-20 LAB — CBC
HCT: 36.3 % — ABNORMAL LOW (ref 39.0–52.0)
Hemoglobin: 10.3 g/dL — ABNORMAL LOW (ref 13.0–17.0)
MCH: 24.9 pg — ABNORMAL LOW (ref 26.0–34.0)
MCHC: 28.4 g/dL — ABNORMAL LOW (ref 30.0–36.0)
MCV: 87.7 fL (ref 80.0–100.0)
Platelets: 306 10*3/uL (ref 150–400)
RBC: 4.14 MIL/uL — ABNORMAL LOW (ref 4.22–5.81)
RDW: 19.9 % — AB (ref 11.5–15.5)
WBC: 6.4 10*3/uL (ref 4.0–10.5)
nRBC: 0 % (ref 0.0–0.2)

## 2018-04-20 LAB — HEPARIN LEVEL (UNFRACTIONATED): Heparin Unfractionated: 0.29 IU/mL — ABNORMAL LOW (ref 0.30–0.70)

## 2018-04-20 MED ORDER — CLOPIDOGREL BISULFATE 75 MG PO TABS: 75.0000 mg | ORAL_TABLET | Freq: Every day | ORAL | 11 refills | Status: DC

## 2018-04-20 MED ORDER — ASPIRIN EC 81 MG PO TBEC: 81.0000 mg | DELAYED_RELEASE_TABLET | Freq: Every day | ORAL | 2 refills | Status: AC

## 2018-04-20 MED ORDER — ASCORBIC ACID 250 MG PO TABS: 250.0000 mg | ORAL_TABLET | Freq: Two times a day (BID) | ORAL | 0 refills | Status: AC

## 2018-04-20 NOTE — Care Management Important Message (Signed)
Patient discharged prior to arrival to floor for delivery of concurrent Medicare IM.

## 2018-04-20 NOTE — Discharge Summary (Signed)
Stanley [name], is a 58 y.o. male  DOB [birth year]  MRN [id number].  Admission date:  04/14/2018  Admitting Physician  Fritzi Mandes, MD  Discharge Date:  04/20/2018   Primary MD  Revelo, Elyse Jarvis, MD  Recommendations for primary care physician for things to follow:  Follow-up with Dr. Lucky Cowboy in 2 weeks.   Admission Diagnosis  ESRD on dialysis ([place]) [N18.6, Z99.2] PAD (peripheral artery disease) ([place]) [I73.9]   Discharge Diagnosis  ESRD on dialysis ([place]) [N18.6, Z99.2] PAD (peripheral artery disease) ([place]) [I73.9]    Active Problems:   PAD (peripheral artery disease) ([place])      Past Medical History:  Diagnosis Date  . Alcohol abuse   . CHF (congestive heart failure) ([place])   . Cirrhosis ([place])   . Coronary artery disease 2009  . Drug abuse ([place])   . End stage renal disease on dialysis [facility]) NEPHROLOGIST-   DR [facility]  IN [place]   HEMODIALYSIS --   TUES/  THURS/  SAT  . Gastrointestinal bleed 06/13/2017   From chart...hx of multiple GI bleeds  . GERD (gastroesophageal reflux disease)   . Hyperlipidemia   . Hypertension   . PAD (peripheral artery disease) ([place])   . Renal insufficiency    Per pt, 32 oz fluid restriction per day  . S/P triple vessel bypass 06/09/2016   2009ish  . Suicidal ideation    & HOMICIDAL IDEATION --  06-16-2013   ADMITTED TO BEHAVIOR HEALTH    Past Surgical History:  Procedure Laterality Date  . A/V FISTULAGRAM Right 06/06/2017   Procedure: A/V FISTULAGRAM;  Surgeon: Katha Cabal, MD;  Location: [place] CV LAB;  Service: Cardiovascular;  Laterality: Right;  . A/V SHUNT INTERVENTION N/A 06/06/2017   Procedure: A/V SHUNT INTERVENTION;  Surgeon: Katha Cabal, MD;  Location: [place] CV LAB;  Service: Cardiovascular;  Laterality: N/A;  . AGILE CAPSULE N/A 06/19/2016    Procedure: AGILE CAPSULE;  Surgeon: [name], MD;  Location: [facility] ENDOSCOPY;  Service: Endoscopy;  Laterality: N/A;  . COLONOSCOPY WITH PROPOFOL N/A 06/18/2016   Procedure: COLONOSCOPY WITH PROPOFOL;  Surgeon: [name], MD;  Location: [facility] ENDOSCOPY;  Service: Endoscopy;  Laterality: N/A;  . COLONOSCOPY WITH PROPOFOL N/A 08/12/2016   Procedure: COLONOSCOPY WITH PROPOFOL;  Surgeon: [name], MD;  Location: [facility] ENDOSCOPY;  Service: Endoscopy;  Laterality: N/A;  . COLONOSCOPY WITH PROPOFOL N/A 05/05/2017   Procedure: COLONOSCOPY WITH PROPOFOL;  Surgeon: Manya Silvas, MD;  Location[facility]COPY;  Service: Endoscopy;  Laterality: N/A;  . CORONARY ANGIOPLASTY  ?   PT UNABLE TO TELL IF  BEFORE OR AFTER  CABG  . CORONARY ARTERY BYPASS GRAFT  2008  ([place] , [place])   3 VESSEL  . DIALYSIS FISTULA CREATION  LAST SURGERY  APPOX  2008  . ENTEROSCOPY N/A 05/10/2016   Procedure: ENTEROSCOPY;  Surgeon: Jerene Bears, MD;  Location: [place];  Service: Gastroenterology;  Laterality: N/A;  . ENTEROSCOPY N/A 08/12/2016   Procedure: ENTEROSCOPY;  Surgeon: [name], MD;  Location: [facility] ENDOSCOPY;  Service: Endoscopy;  Laterality: N/A;  . ENTEROSCOPY Left 06/03/2017   Procedure: ENTEROSCOPY;  Surgeon: Virgel Manifold, MD;  Location: [facility] ENDOSCOPY;  Service: Endoscopy;  Laterality: Left;  Procedure date will ultimately depend on when patient is medically optimized before the procedure, pending hemodialysis and blood transfusions etc. Will place on schedule and change depending on clinical status.   . ENTEROSCOPY N/A 06/05/2017   Procedure: ENTEROSCOPY;  Surgeon: Virgel Manifold, MD;  Location: [facility] ENDOSCOPY;  Service: Endoscopy;  Laterality: N/A;  . ENTEROSCOPY N/A 06/15/2017   Procedure: Push ENTEROSCOPY;  Surgeon: [name], MD;  Location: [facility] ENDOSCOPY;  Service: Endoscopy;  Laterality: N/A;  . ESOPHAGOGASTRODUODENOSCOPY N/A 05/07/2015   Procedure: ESOPHAGOGASTRODUODENOSCOPY (EGD);   Surgeon: Hulen Luster, MD;  Location: [facility] ENDOSCOPY;  Service: Endoscopy;  Laterality: N/A;  . ESOPHAGOGASTRODUODENOSCOPY (EGD) WITH PROPOFOL N/A 05/17/2015   Procedure: ESOPHAGOGASTRODUODENOSCOPY (EGD) WITH PROPOFOL;  Surgeon: [name], MD;  Location: [facility] ENDOSCOPY;  Service: Endoscopy;  Laterality: N/A;  . ESOPHAGOGASTRODUODENOSCOPY (EGD) WITH PROPOFOL N/A 01/20/2016   Procedure: ESOPHAGOGASTRODUODENOSCOPY (EGD) WITH PROPOFOL;  Surgeon: [name], MD;  Location: [facility] ENDOSCOPY;  Service: Endoscopy;  Laterality: N/A;  . ESOPHAGOGASTRODUODENOSCOPY (EGD) WITH PROPOFOL N/A 04/17/2016   Procedure: ESOPHAGOGASTRODUODENOSCOPY (EGD) WITH PROPOFOL;  Surgeon: Lin Landsman, MD;  Location: [facility] ENDOSCOPY;  Service: Gastroenterology;  Laterality: N/A;  . ESOPHAGOGASTRODUODENOSCOPY (EGD) WITH PROPOFOL  05/09/2016   Procedure: ESOPHAGOGASTRODUODENOSCOPY (EGD) WITH PROPOFOL;  Surgeon: Jerene Bears, MD;  Location: [place];  Service: Endoscopy;;  . ESOPHAGOGASTRODUODENOSCOPY (EGD) WITH PROPOFOL N/A 06/16/2016   Procedure: ESOPHAGOGASTRODUODENOSCOPY (EGD) WITH PROPOFOL;  Surgeon: [name], MD;  Location: [facility] ENDOSCOPY;  Service: Endoscopy;  Laterality: N/A;  . ESOPHAGOGASTRODUODENOSCOPY (EGD) WITH PROPOFOL N/A 05/05/2017   Procedure: ESOPHAGOGASTRODUODENOSCOPY (EGD) WITH PROPOFOL;  Surgeon: Manya Silvas, MD;  Location: [facility] ENDOSCOPY;  Service: Endoscopy;  Laterality: N/A;  . ESOPHAGOGASTRODUODENOSCOPY (EGD) WITH PROPOFOL N/A 06/15/2017   Procedure: ESOPHAGOGASTRODUODENOSCOPY (EGD) WITH PROPOFOL;  Surgeon: [name], MD;  Location: [facility] ENDOSCOPY;  Service: Endoscopy;  Laterality: N/A;  . GIVENS CAPSULE STUDY N/A 05/07/2016   Procedure: GIVENS CAPSULE STUDY;  Surgeon: Doran Stabler, MD;  Location: [place];  Service: Endoscopy;  Laterality: N/A;  . LOWER EXTREMITY ANGIOGRAPHY Left 04/19/2018   Procedure: LOWER EXTREMITY ANGIOGRAPHY;  Surgeon: Algernon Huxley, MD;  Location: [place] CV LAB;   Service: Cardiovascular;  Laterality: Left;  Marland Kitchen MANDIBULAR HARDWARE REMOVAL N/A 07/29/2013   Procedure: REMOVAL OF ARCH BARS;  Surgeon: Theodoro Kos, DO;  Location: [place];  Service: Plastics;  Laterality: N/A;  . ORIF MANDIBULAR FRACTURE N/A 06/05/2013   Procedure: REPAIR OF MANDIBULAR FRACTURE x 2 with maxillo-mandibular fixation ;  Surgeon: Theodoro Kos, DO;  Location: [place];  Service: Plastics;  Laterality: N/A;  . PARACENTESIS    . PERIPHERAL ARTERIAL STENT GRAFT Left        History of present illness and  Hospital Course:     Kindly see H&P for history of present illness and admission details, please review complete Labs, Consult reports and Test reports for all details in brief  HPI  from the history and physical done on the day of admission  58 year old male patient with history of ESRD on hemodialysis today, Thursday, Saturday comes in because of left leg pain, admitted for left leg ischemia with diminished pulse on left foot.  Hospital Course  #1 /left leg pain secondary to acute leg ischemia: Patient started on heparin drip, admitted to medical floor, seen by vascular surgery, patient pain also is controlled with oxycodone.  Does have diminished pulses in dorsalis pedis, left posterior tibial, patient continued to feel pain in the left leg, patient had angiogram of the left leg, stent placed in left profunda femoris, also had angioplasty of left common femoral.  Patient feeling much better, spoke with vascular Dr. Leotis Pain , recommended aspirin, Plavix, discharge the patient home and he  will see the patient in 2 weeks regarding further treatment as patient still has significant disease and possibly need angiogram.  Patient also will get statins in addition to aspirin, Plavix secondary to his PAD.  Hyperkalemia' potassium was 6, treated  with Lokelma, dialysis.  Patient seen by nephrology, continue dialysis Tuesday, Thursday, Saturday. Marland Kitchen  Discharge Condition:  Stable   Follow UP  Follow-up Information    Revelo, Elyse Jarvis, MD. Schedule an appointment as soon as possible for a visit in 1 week(s).   Specialty:  Family Medicine Contact information: [address] [place] [address] [phone]        Algernon Huxley, MD. Schedule an appointment as soon as possible for a visit in 2 week(s).   Specialties:  Vascular Surgery, Radiology, Interventional Cardiology Contact information: [place] [address] [phone]             Discharge Instructions  and  Discharge Medications      Allergies as of 04/20/2018   No Known Allergies     Medication List    STOP taking these medications   acetaminophen 325 MG tablet Commonly known as:  TYLENOL     TAKE these medications   ascorbic acid 250 MG tablet Commonly known as:  VITAMIN C Take 1 tablet (250 mg total) by mouth 2 (two) times daily.   aspirin EC 81 MG tablet Take 1 tablet (81 mg total) by mouth daily.   budesonide-formoterol 160-4.5 MCG/ACT inhaler Commonly known as:  SYMBICORT Inhale 2 puffs into the lungs daily.   calcium acetate 667 MG capsule Commonly known as:  PHOSLO Take 2,001 mg by mouth 3 (three) times daily.   clopidogrel 75 MG tablet Commonly known as:  PLAVIX Take 1 tablet (75 mg total) by mouth daily.   folic acid 1 MG tablet Commonly known as:  FOLVITE Take 1 tablet (1 mg total) by mouth daily.   gabapentin 300 MG capsule Commonly known as:  NEURONTIN Take 300 mg by mouth 3 (three) times daily.   ipratropium-albuterol 0.5-2.5 (3) MG/3ML Soln Commonly known as:  DUONEB Take 3 mLs by nebulization every 6 (six) hours as needed.   labetalol 100 MG tablet Commonly known as:  NORMODYNE Take 100 mg by mouth daily.   multivitamin Tabs tablet Take 1 tablet by mouth at bedtime.   omeprazole 40 MG capsule Commonly known as:  PRILOSEC Take 40 mg by mouth daily.   oxyCODONE 5 MG immediate release tablet Commonly  known as:  Oxy IR/ROXICODONE Take 1 tablet (5 mg total) by mouth every 4 (four) hours as needed for moderate pain.   tiotropium 18 MCG inhalation capsule Commonly known as:  SPIRIVA HANDIHALER Place 1 capsule (18 mcg total) into inhaler and inhale daily.         Diet and Activity recommendation: See Discharge Instructions above   Consults obtained -vascular, nephrology  Major procedures and Radiology Reports - PLEASE review detailed and final reports for all details, in brief -      Dg Chest 2 View  Result Date: 03/26/2018 CLINICAL DATA:  Shortness of breath and weakness. EXAM: CHEST - 2 VIEW COMPARISON:  Chest x-ray dated March 13, 2018. FINDINGS: Unchanged tunneled right internal jugular dialysis catheter. Stable cardiomegaly status post CABG. Mildly progressed diffuse interstitial thickening. No focal consolidation, pleural effusion, or pneumothorax. No acute osseous abnormality. IMPRESSION: 1. Mildly progressed interstitial pulmonary edema. Electronically Signed   By: Titus Dubin M.D.   On:  03/26/2018 15:25   US Paracentesis  Result Date: 04/04/2018 INDICATION: Recurrent ascites. EXAM: ULTRASOUND GUIDED PARACENTESIS MEDICATIONS: None. COMPLICATIONS: None immediate. PROCEDURE: Informed written consent was obtained from the patient after a discussion of the risks, benefits and alternatives to treatment. A timeout was performed prior to the initiation of the procedure. Initial ultrasound was performed to localize ascites. The right lower abdomen was prepped and draped in the usual sterile fashion. 1% lidocaine was used for local anesthesia. Following this, a 6 Fr Safe-T-Centesis catheter was introduced. An ultrasound image was saved for documentation purposes. The paracentesis was performed. The catheter was removed and a dressing was applied. The patient tolerated the procedure well without immediate post procedural complication. FINDINGS: A total of approximately 5 L of amber  colored fluid was removed. IMPRESSION: Successful ultrasound-guided paracentesis yielding 5 liters of peritoneal fluid. Electronically Signed   By: Aletta Edouard M.D.   On: 04/04/2018 10:22   US Paracentesis  Result Date: 03/22/2018 INDICATION: Recurrent ascites. EXAM: ULTRASOUND GUIDED PARACENTESIS MEDICATIONS: None. COMPLICATIONS: None immediate. PROCEDURE: Informed written consent was obtained from the patient after a discussion of the risks, benefits and alternatives to treatment. A timeout was performed prior to the initiation of the procedure. Initial ultrasound was performed to localize ascites. The right lower abdomen was prepped and draped in the usual sterile fashion. 1% lidocaine was used for local anesthesia. Following this, a 6 Fr Safe-T-Centesis catheter was introduced. An ultrasound image was saved for documentation purposes. The paracentesis was performed. The catheter was removed and a dressing was applied. The patient tolerated the procedure well without immediate post procedural complication. FINDINGS: A total of approximately 5 L of yellow fluid was removed. IMPRESSION: Successful ultrasound-guided paracentesis yielding 5 liters of peritoneal fluid. Electronically Signed   By: Aletta Edouard M.D.   On: 03/22/2018 14:28   Dg Chest Portable 1 View  Result Date: 04/09/2018 CLINICAL DATA:  Shortness of breath. EXAM: PORTABLE CHEST 1 VIEW COMPARISON:  04/08/2018 and 04/03/2018 and 03/26/2018 FINDINGS: There is chronic cardiomegaly. Slight pulmonary vascular congestion, unchanged. Central venous catheter in place, unchanged. No pulmonary edema. No effusions. Aortic atherosclerosis. CABG. IMPRESSION: 1. Chronic cardiomegaly with mild chronic pulmonary vascular congestion. No significant change since the prior exam. 2.  Aortic Atherosclerosis (ICD10-I70.0). Electronically Signed   By: Lorriane Shire M.D.   On: 04/09/2018 12:31   Dg Chest Portable 1 View  Result Date: 04/08/2018 CLINICAL  DATA:  Shortness of breath, recent missed dialysis treatment EXAM: PORTABLE CHEST 1 VIEW COMPARISON:  04/03/2018 FINDINGS: Cardiac shadow remains enlarged. Postsurgical changes are again seen. Dialysis catheter is again noted. Increase in central vascular congestion is noted when compared with the prior study. No focal infiltrate or effusion is seen. IMPRESSION: Slight increase in central vascular congestion consistent with volume overload likely related to the recently missed dialysis session. Electronically Signed   By: Inez Catalina M.D.   On: 04/08/2018 17:13   Dg Chest Portable 1 View  Result Date: 04/03/2018 CLINICAL DATA:  Shortness of breath, tightness in lower abdomen, 90% oxygen saturation on room air at arrival EXAM: PORTABLE CHEST 1 VIEW COMPARISON:  03/26/2018 FINDINGS: RIGHT jugular central venous catheter with tip projecting over SVC. Enlargement of cardiac silhouette post CABG. Slight pulmonary vascular congestion. Atherosclerotic calcifications aorta. Persistent interstitial prominence which likely represents mild persistent pulmonary edema. Minimal bibasilar atelectasis. No gross pleural effusion or pneumothorax. Healing fractures of the lateral RIGHT ninth and tenth ribs. IMPRESSION: Enlargement of cardiac silhouette with pulmonary vascular congestion  and probable mild pulmonary edema. Healing fractures of the lateral RIGHT ninth and tenth ribs. Electronically Signed   By: Lavonia Dana M.D.   On: 04/03/2018 09:01    Micro Results    Recent Results (from the past 240 hour(s))  MRSA PCR Screening     Status: None   Collection Time: 04/15/18  1:38 AM  Result Value Ref Range Status   MRSA by PCR NEGATIVE NEGATIVE Final    Comment:        The GeneXpert MRSA Assay (FDA approved for NASAL specimens only), is one component of a comprehensive MRSA colonization surveillance program. It is not intended to diagnose MRSA infection nor to guide or monitor treatment for MRSA  infections. Performed at [facility], [address] [place], [place] [address]        Today   Subjective:   Joas Motton today has no headache,no chest abdominal pain,no new weakness tingling or numbness, feels much better wants to go home today.   Objective:   Blood pressure 121/83, pulse 78, temperature 98.2 F (36.8 C), temperature source Oral, resp. rate 18, height 6\' 3"  (1.905 m), weight 79.2 kg, SpO2 98 %.   Intake/Output Summary (Last 24 hours) at 04/20/2018 0936 Last data filed at 04/20/2018 0631 Gross per 24 hour  Intake 983.13 ml  Output 1537 ml  Net -553.87 ml    Exam Awake Alert, Oriented x 3, No new F.N deficits, Normal affect [place].AT,PERRAL Supple Neck,No JVD, No cervical lymphadenopathy appriciated.  Symmetrical Chest wall movement, Good air movement bilaterally, CTAB RRR,No Gallops,Rubs or new Murmurs, No Parasternal Heave +ve B.Sounds, Abd Soft, Non tender, No organomegaly appriciated, No rebound -guarding or rigidity. No Cyanosis, Clubbing or edema, No new Rash or bruise  Data Review   CBC w Diff:  Lab Results  Component Value Date   WBC 6.4 04/20/2018   HGB 10.3 (L) 04/20/2018   HGB 12.7 (L) 06/10/2014   HCT 36.3 (L) 04/20/2018   HCT 25.5 (L) 01/24/2017   PLT 306 04/20/2018   PLT 201 06/10/2014   LYMPHOPCT 16 04/14/2018   LYMPHOPCT 13.6 06/10/2014   BANDSPCT 1 04/05/2015   MONOPCT 12 04/14/2018   MONOPCT 10.4 06/10/2014   EOSPCT 3 04/14/2018   EOSPCT 0.7 06/10/2014   BASOPCT 1 04/14/2018   BASOPCT 0.6 06/10/2014    CMP:  Lab Results  Component Value Date   NA 134 (L) 04/19/2018   NA 143 06/10/2014   K 4.5 04/19/2018   K 5.1 06/10/2014   CL 93 (L) 04/19/2018   CL 102 06/10/2014   CO2 29 04/19/2018   CO2 30 06/10/2014   BUN 36 (H) 04/19/2018   BUN 20 06/10/2014   CREATININE 8.01 (H) 04/19/2018   CREATININE 5.42 (H) 06/10/2014   PROT 7.3 04/09/2018   PROT 7.3 06/10/2014   ALBUMIN 3.3 (L) 04/14/2018   ALBUMIN 3.4  (L) 06/10/2014   BILITOT 1.3 (H) 04/09/2018   BILITOT 1.9 (H) 06/10/2014   ALKPHOS 124 04/09/2018   ALKPHOS 156 (H) 06/10/2014   AST 31 04/09/2018   AST 53 (H) 06/10/2014   ALT 13 04/09/2018   ALT 32 06/10/2014  .   Total Time in preparing paper work, data evaluation and todays exam - 61 minutes  Epifanio Lesches M.D on 04/20/2018 at 9:36 AM    Note: This dictation was prepared with Dragon dictation along with smaller phrase technology. Any transcriptional errors that result from this process are unintentional.

## 2018-04-20 NOTE — Progress Notes (Signed)
Patient discharge teaching given, including activity, diet, follow-up appoints, and medications. Patient verbalized understanding of all discharge instructions. IV access was d/c'd.  Heparin drip was stopped, RN expressed the importance of picking up his ASA and Plavix prescriptions. Pt verbalized the importance of taking his new prescriptions this evening. Vitals are stable. Skin is intact except as charted in most recent assessments. Pt to be escorted out by volunteer, to be driven home by family.  Sagal Gayton [employer]

## 2018-04-20 NOTE — Care Management (Signed)
Amanda Morris dialysis liaison notified of discharge  

## 2018-04-23 ENCOUNTER — Other Ambulatory Visit (INDEPENDENT_AMBULATORY_CARE_PROVIDER_SITE_OTHER): Payer: Self-pay | Admitting: Vascular Surgery

## 2018-04-23 ENCOUNTER — Ambulatory Visit (INDEPENDENT_AMBULATORY_CARE_PROVIDER_SITE_OTHER): Payer: Medicare Other

## 2018-04-23 ENCOUNTER — Encounter (INDEPENDENT_AMBULATORY_CARE_PROVIDER_SITE_OTHER): Payer: Medicare Other | Admitting: Vascular Surgery

## 2018-04-23 ENCOUNTER — Encounter (INDEPENDENT_AMBULATORY_CARE_PROVIDER_SITE_OTHER): Payer: Self-pay

## 2018-04-23 DIAGNOSIS — N186 End stage renal disease: Secondary | ICD-10-CM | POA: Diagnosis not present

## 2018-04-25 IMAGING — CR DG CHEST 2V
2 series · 2 of 2 positions shown · non-contrast
Comparison: Chest radiograph performed 04/21/2016

CLINICAL DATA: Subacute onset of shortness of breath. Initial
encounter.

EXAM:
CHEST  2 VIEW

[chest pa]
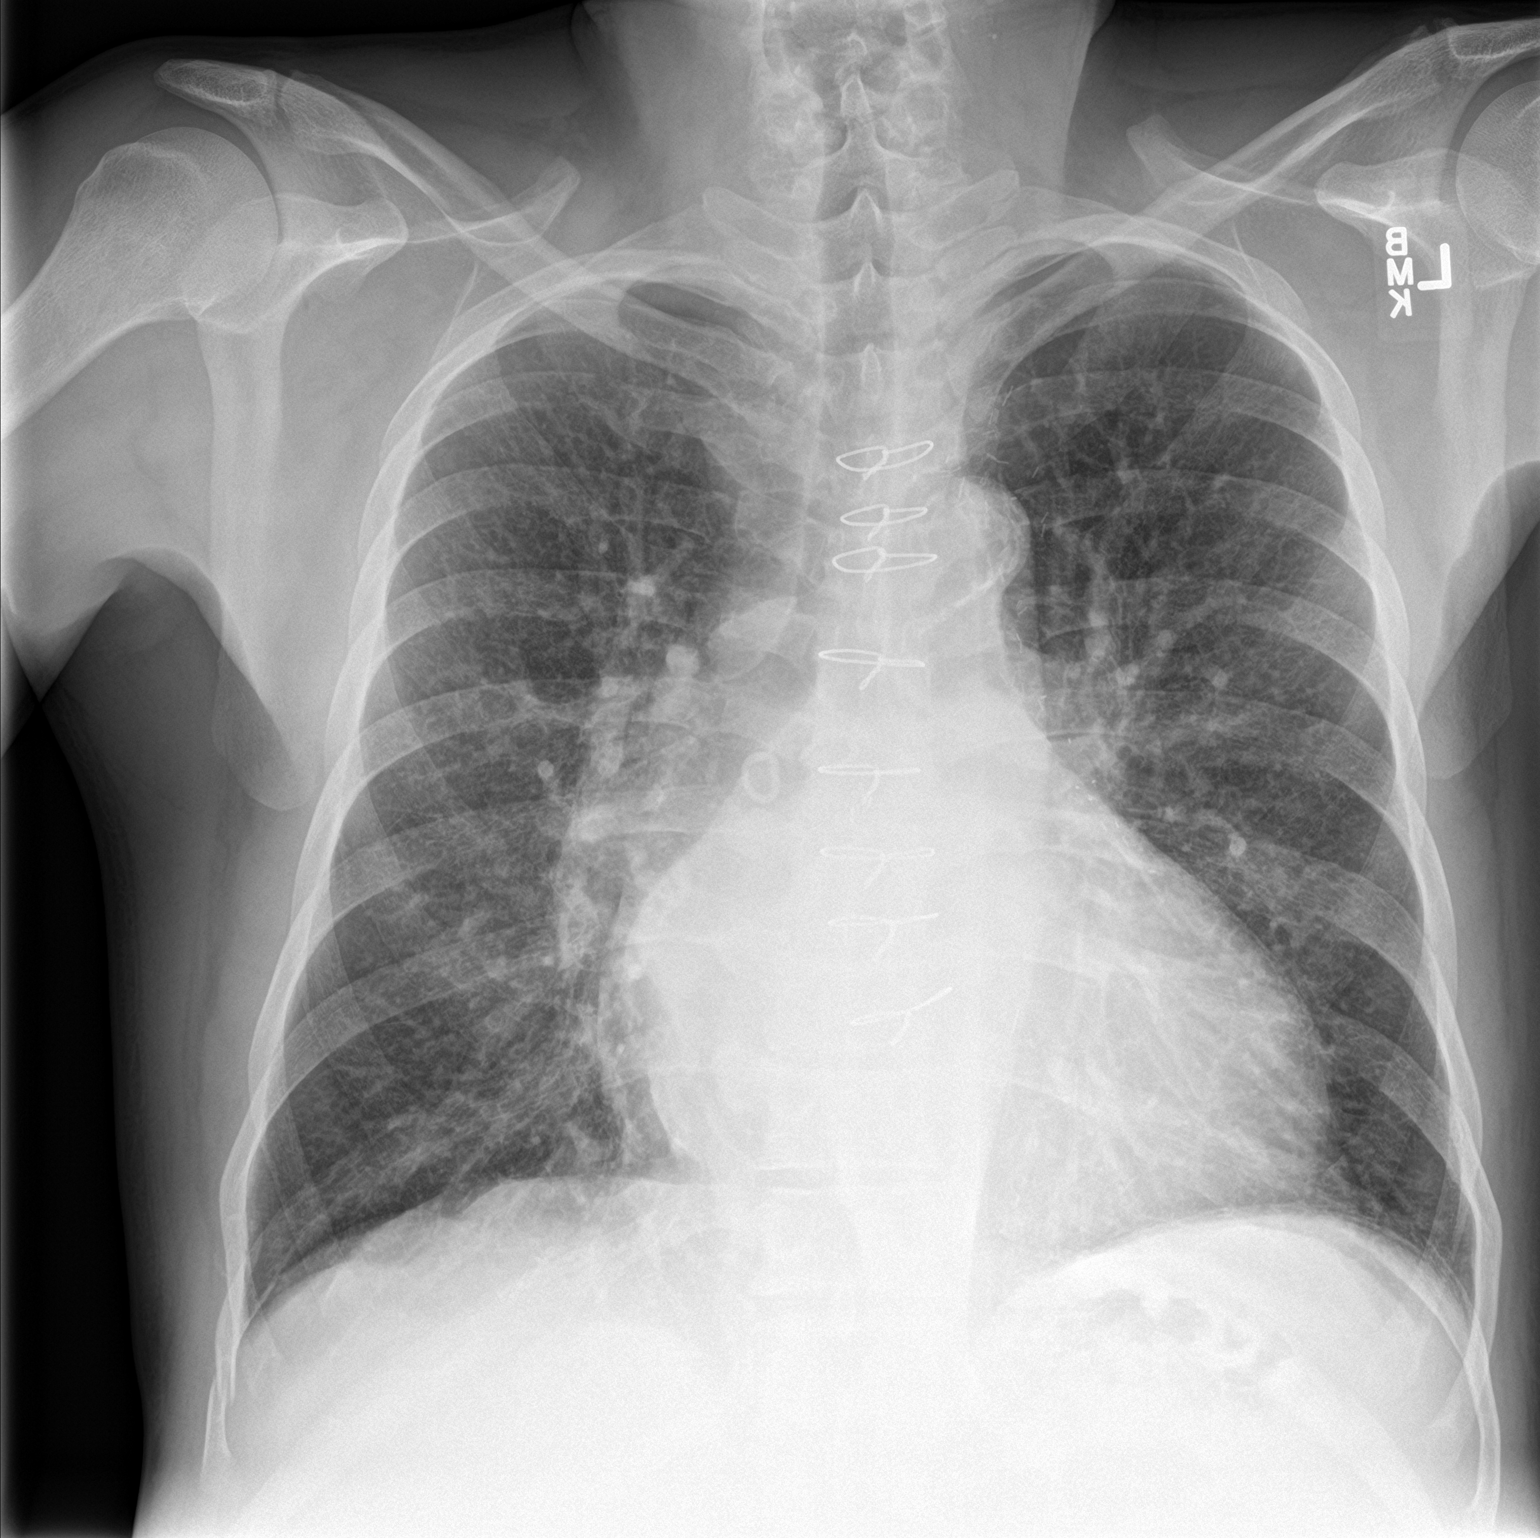

[chest lat]
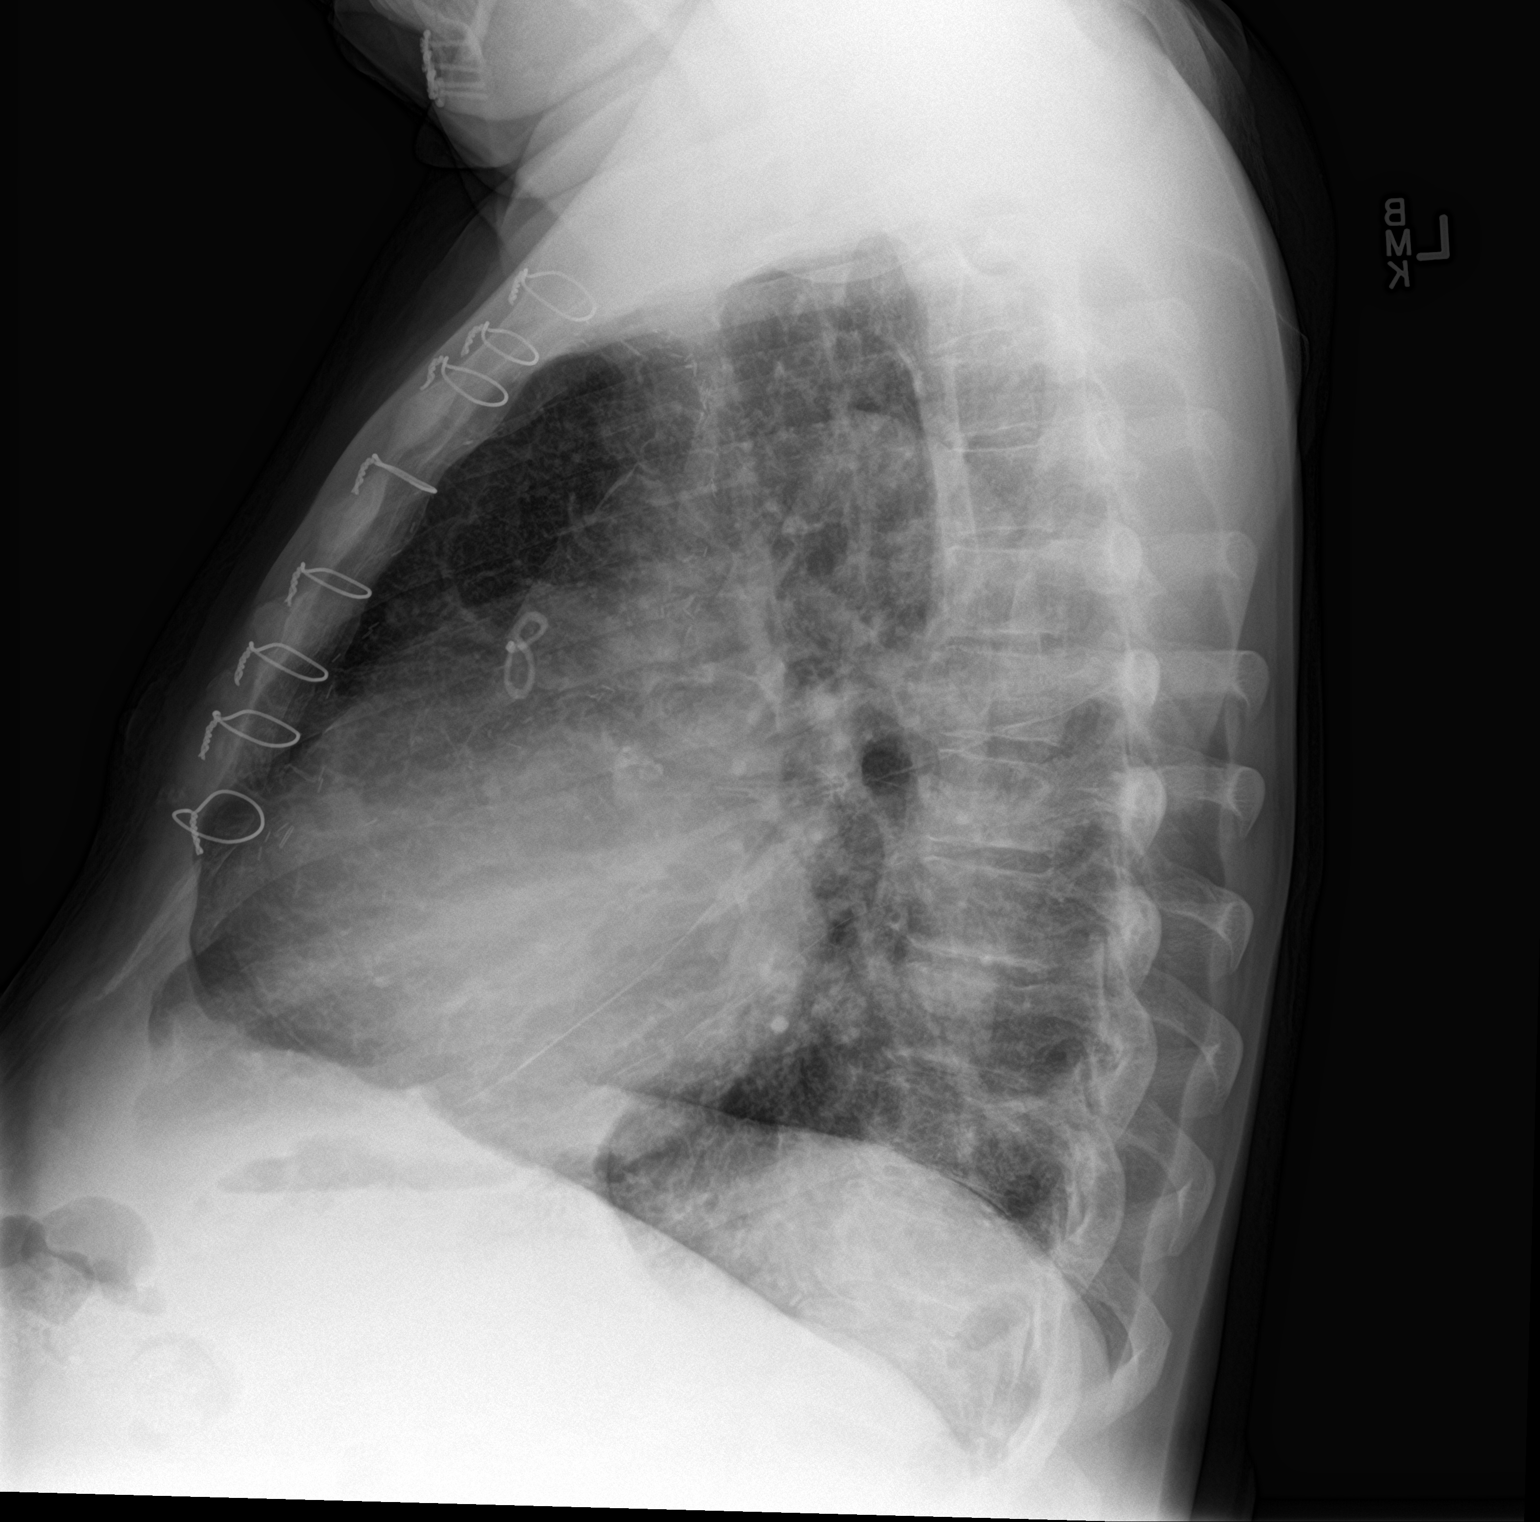

[2 of 2 positions shown; findings below may reference images not displayed]

FINDINGS: The lungs are well-aerated. Vascular congestion is noted. Increased
interstitial markings raise concern for mild interstitial edema.
There is no evidence of pleural effusion or pneumothorax.

The heart is mildly enlarged. The patient is status post median
sternotomy. No acute osseous abnormalities are seen.
IMPRESSION: Vascular congestion and mild cardiomegaly. Increased interstitial
markings raise concern for mild interstitial edema.

## 2018-04-26 ENCOUNTER — Emergency Department: Payer: Medicare Other

## 2018-04-26 ENCOUNTER — Inpatient Hospital Stay: Payer: Medicare Other

## 2018-04-26 ENCOUNTER — Other Ambulatory Visit: Payer: Self-pay

## 2018-04-26 ENCOUNTER — Encounter: Payer: Self-pay | Admitting: Emergency Medicine

## 2018-04-26 ENCOUNTER — Inpatient Hospital Stay
Admission: EM | Admit: 2018-04-26 | Discharge: 2018-04-29 | DRG: 377 | Discharge status: Against Medical Advice | Payer: Medicare Other | Attending: Internal Medicine | Admitting: Internal Medicine

## 2018-04-26 ENCOUNTER — Ambulatory Visit (INDEPENDENT_AMBULATORY_CARE_PROVIDER_SITE_OTHER): Payer: Medicare Other | Admitting: Vascular Surgery

## 2018-04-26 DIAGNOSIS — I248 Other forms of acute ischemic heart disease: Secondary | ICD-10-CM | POA: Diagnosis present

## 2018-04-26 DIAGNOSIS — K228 Other specified diseases of esophagus: Secondary | ICD-10-CM | POA: Diagnosis present

## 2018-04-26 DIAGNOSIS — E785 Hyperlipidemia, unspecified: Secondary | ICD-10-CM | POA: Diagnosis present

## 2018-04-26 DIAGNOSIS — E875 Hyperkalemia: Secondary | ICD-10-CM | POA: Diagnosis present

## 2018-04-26 DIAGNOSIS — I864 Gastric varices: Secondary | ICD-10-CM | POA: Diagnosis present

## 2018-04-26 DIAGNOSIS — G8929 Other chronic pain: Secondary | ICD-10-CM | POA: Diagnosis present

## 2018-04-26 DIAGNOSIS — R188 Other ascites: Secondary | ICD-10-CM

## 2018-04-26 DIAGNOSIS — J9600 Acute respiratory failure, unspecified whether with hypoxia or hypercapnia: Secondary | ICD-10-CM | POA: Diagnosis not present

## 2018-04-26 DIAGNOSIS — I509 Heart failure, unspecified: Secondary | ICD-10-CM | POA: Diagnosis not present

## 2018-04-26 DIAGNOSIS — E8770 Fluid overload, unspecified: Secondary | ICD-10-CM | POA: Diagnosis not present

## 2018-04-26 DIAGNOSIS — J9601 Acute respiratory failure with hypoxia: Secondary | ICD-10-CM | POA: Diagnosis not present

## 2018-04-26 DIAGNOSIS — R0902 Hypoxemia: Secondary | ICD-10-CM | POA: Diagnosis not present

## 2018-04-26 DIAGNOSIS — I959 Hypotension, unspecified: Secondary | ICD-10-CM | POA: Diagnosis not present

## 2018-04-26 DIAGNOSIS — Z79891 Long term (current) use of opiate analgesic: Secondary | ICD-10-CM

## 2018-04-26 DIAGNOSIS — K209 Esophagitis, unspecified: Secondary | ICD-10-CM | POA: Diagnosis not present

## 2018-04-26 DIAGNOSIS — K210 Gastro-esophageal reflux disease with esophagitis: Secondary | ICD-10-CM | POA: Diagnosis present

## 2018-04-26 DIAGNOSIS — Z7951 Long term (current) use of inhaled steroids: Secondary | ICD-10-CM

## 2018-04-26 DIAGNOSIS — Z720 Tobacco use: Secondary | ICD-10-CM | POA: Diagnosis not present

## 2018-04-26 DIAGNOSIS — I272 Pulmonary hypertension, unspecified: Secondary | ICD-10-CM | POA: Diagnosis present

## 2018-04-26 DIAGNOSIS — N2581 Secondary hyperparathyroidism of renal origin: Secondary | ICD-10-CM | POA: Diagnosis present

## 2018-04-26 DIAGNOSIS — R52 Pain, unspecified: Secondary | ICD-10-CM | POA: Diagnosis not present

## 2018-04-26 DIAGNOSIS — N186 End stage renal disease: Secondary | ICD-10-CM | POA: Diagnosis not present

## 2018-04-26 DIAGNOSIS — D631 Anemia in chronic kidney disease: Secondary | ICD-10-CM | POA: Diagnosis present

## 2018-04-26 DIAGNOSIS — F1010 Alcohol abuse, uncomplicated: Secondary | ICD-10-CM | POA: Diagnosis present

## 2018-04-26 DIAGNOSIS — I739 Peripheral vascular disease, unspecified: Secondary | ICD-10-CM | POA: Diagnosis present

## 2018-04-26 DIAGNOSIS — I8510 Secondary esophageal varices without bleeding: Secondary | ICD-10-CM | POA: Diagnosis present

## 2018-04-26 DIAGNOSIS — G9341 Metabolic encephalopathy: Secondary | ICD-10-CM | POA: Diagnosis not present

## 2018-04-26 DIAGNOSIS — Z9861 Coronary angioplasty status: Secondary | ICD-10-CM

## 2018-04-26 DIAGNOSIS — F17210 Nicotine dependence, cigarettes, uncomplicated: Secondary | ICD-10-CM | POA: Diagnosis present

## 2018-04-26 DIAGNOSIS — D62 Acute posthemorrhagic anemia: Secondary | ICD-10-CM | POA: Diagnosis present

## 2018-04-26 DIAGNOSIS — R1084 Generalized abdominal pain: Secondary | ICD-10-CM | POA: Diagnosis not present

## 2018-04-26 DIAGNOSIS — K921 Melena: Secondary | ICD-10-CM | POA: Diagnosis present

## 2018-04-26 DIAGNOSIS — J441 Chronic obstructive pulmonary disease with (acute) exacerbation: Secondary | ICD-10-CM | POA: Diagnosis not present

## 2018-04-26 DIAGNOSIS — I2510 Atherosclerotic heart disease of native coronary artery without angina pectoris: Secondary | ICD-10-CM | POA: Diagnosis present

## 2018-04-26 DIAGNOSIS — N189 Chronic kidney disease, unspecified: Secondary | ICD-10-CM | POA: Diagnosis not present

## 2018-04-26 DIAGNOSIS — I5032 Chronic diastolic (congestive) heart failure: Secondary | ICD-10-CM | POA: Diagnosis present

## 2018-04-26 DIAGNOSIS — E1122 Type 2 diabetes mellitus with diabetic chronic kidney disease: Secondary | ICD-10-CM | POA: Diagnosis not present

## 2018-04-26 DIAGNOSIS — Z800 Family history of malignant neoplasm of digestive organs: Secondary | ICD-10-CM

## 2018-04-26 DIAGNOSIS — J9811 Atelectasis: Secondary | ICD-10-CM | POA: Diagnosis not present

## 2018-04-26 DIAGNOSIS — K7031 Alcoholic cirrhosis of liver with ascites: Secondary | ICD-10-CM | POA: Diagnosis present

## 2018-04-26 DIAGNOSIS — K920 Hematemesis: Secondary | ICD-10-CM | POA: Diagnosis not present

## 2018-04-26 DIAGNOSIS — R4182 Altered mental status, unspecified: Secondary | ICD-10-CM | POA: Diagnosis not present

## 2018-04-26 DIAGNOSIS — I132 Hypertensive heart and chronic kidney disease with heart failure and with stage 5 chronic kidney disease, or end stage renal disease: Secondary | ICD-10-CM | POA: Diagnosis not present

## 2018-04-26 DIAGNOSIS — Z992 Dependence on renal dialysis: Secondary | ICD-10-CM

## 2018-04-26 DIAGNOSIS — Z951 Presence of aortocoronary bypass graft: Secondary | ICD-10-CM

## 2018-04-26 DIAGNOSIS — J811 Chronic pulmonary edema: Secondary | ICD-10-CM | POA: Diagnosis not present

## 2018-04-26 DIAGNOSIS — K2970 Gastritis, unspecified, without bleeding: Secondary | ICD-10-CM | POA: Diagnosis not present

## 2018-04-26 DIAGNOSIS — R609 Edema, unspecified: Secondary | ICD-10-CM | POA: Diagnosis not present

## 2018-04-26 DIAGNOSIS — Z841 Family history of disorders of kidney and ureter: Secondary | ICD-10-CM

## 2018-04-26 DIAGNOSIS — Z7902 Long term (current) use of antithrombotics/antiplatelets: Secondary | ICD-10-CM

## 2018-04-26 DIAGNOSIS — I120 Hypertensive chronic kidney disease with stage 5 chronic kidney disease or end stage renal disease: Secondary | ICD-10-CM | POA: Diagnosis not present

## 2018-04-26 DIAGNOSIS — K7460 Unspecified cirrhosis of liver: Secondary | ICD-10-CM | POA: Diagnosis not present

## 2018-04-26 DIAGNOSIS — K922 Gastrointestinal hemorrhage, unspecified: Secondary | ICD-10-CM

## 2018-04-26 DIAGNOSIS — R140 Abdominal distension (gaseous): Secondary | ICD-10-CM | POA: Diagnosis not present

## 2018-04-26 DIAGNOSIS — Z7982 Long term (current) use of aspirin: Secondary | ICD-10-CM

## 2018-04-26 DIAGNOSIS — Z9119 Patient's noncompliance with other medical treatment and regimen: Secondary | ICD-10-CM

## 2018-04-26 DIAGNOSIS — Z79899 Other long term (current) drug therapy: Secondary | ICD-10-CM

## 2018-04-26 LAB — COMPREHENSIVE METABOLIC PANEL
ALK PHOS: 82 U/L (ref 38–126)
ALT: 5 U/L (ref 0–44)
ALT: <5 U/L (ref 0–44)
ANION GAP: 15 (ref 5–15)
AST: 26 U/L (ref 15–41)
AST: 22 U/L (ref 15–41)
Albumin: 2.7 g/dL — ABNORMAL LOW (ref 3.5–5.0)
Albumin: 2.8 g/dL — ABNORMAL LOW (ref 3.5–5.0)
Alkaline Phosphatase: 80 U/L (ref 38–126)
Anion gap: 13 (ref 5–15)
BUN: 77 mg/dL — ABNORMAL HIGH (ref 6–20)
BUN: 43 mg/dL — ABNORMAL HIGH (ref 6–20)
CO2: 42 mmol/L — ABNORMAL HIGH (ref 22–32)
CO2: 34 mmol/L — ABNORMAL HIGH (ref 22–32)
Calcium: 9.6 mg/dL (ref 8.9–10.3)
Calcium: 9 mg/dL (ref 8.9–10.3)
Chloride: 83 mmol/L — ABNORMAL LOW (ref 98–111)
Chloride: 92 mmol/L — ABNORMAL LOW (ref 98–111)
Creatinine, Ser: 13.09 mg/dL — ABNORMAL HIGH (ref 0.61–1.24)
Creatinine, Ser: 8.02 mg/dL — ABNORMAL HIGH (ref 0.61–1.24)
GFR calc Af Amer: 4 mL/min — ABNORMAL LOW (ref >60)
GFR calc Af Amer: 8 mL/min — ABNORMAL LOW (ref >60)
GFR calc non Af Amer: 4 mL/min — ABNORMAL LOW (ref >60)
GFR calc non Af Amer: 7 mL/min — ABNORMAL LOW (ref >60)
Glucose, Bld: 116 mg/dL — ABNORMAL HIGH (ref 70–99)
Glucose, Bld: 103 mg/dL — ABNORMAL HIGH (ref 70–99)
Potassium: 6 mmol/L — ABNORMAL HIGH (ref 3.5–5.1)
Potassium: 5.3 mmol/L — ABNORMAL HIGH (ref 3.5–5.1)
Sodium: 140 mmol/L (ref 135–145)
Sodium: 139 mmol/L (ref 135–145)
Total Bilirubin: 0.9 mg/dL (ref 0.3–1.2)
Total Bilirubin: 0.9 mg/dL (ref 0.3–1.2)
Total Protein: 7.2 g/dL (ref 6.5–8.1)
Total Protein: 7.3 g/dL (ref 6.5–8.1)

## 2018-04-26 LAB — BLOOD GAS, ARTERIAL
ACID-BASE EXCESS: 12.7 mmol/L — AB (ref 0.0–2.0)
Bicarbonate: 38.6 mmol/L — ABNORMAL HIGH (ref 20.0–28.0)
FIO2: 0.36
O2 Saturation: 94.4 %
PH ART: 7.47 — AB (ref 7.350–7.450)
Patient temperature: 37
pCO2 arterial: 53 mmHg — ABNORMAL HIGH (ref 32.0–48.0)
pO2, Arterial: 68 mmHg — ABNORMAL LOW (ref 83.0–108.0)

## 2018-04-26 LAB — TYPE AND SCREEN
ABO/RH(D): A POS
Antibody Screen: NEGATIVE

## 2018-04-26 LAB — CBC
HEMATOCRIT: 31.8 % — AB (ref 39.0–52.0)
Hemoglobin: 9.2 g/dL — ABNORMAL LOW (ref 13.0–17.0)
MCH: 24.9 pg — ABNORMAL LOW (ref 26.0–34.0)
MCHC: 28.9 g/dL — ABNORMAL LOW (ref 30.0–36.0)
MCV: 86.2 fL (ref 80.0–100.0)
Platelets: 366 10*3/uL (ref 150–400)
RBC: 3.69 MIL/uL — ABNORMAL LOW (ref 4.22–5.81)
RDW: 20.2 % — ABNORMAL HIGH (ref 11.5–15.5)
WBC: 9.9 10*3/uL (ref 4.0–10.5)
nRBC: 0 % (ref 0.0–0.2)

## 2018-04-26 LAB — GLUCOSE, CAPILLARY
GLUCOSE-CAPILLARY: 113 mg/dL — AB (ref 70–99)
Glucose-Capillary: 83 mg/dL (ref 70–99)

## 2018-04-26 LAB — TROPONIN I: Troponin I: 0.06 ng/mL (ref <0.03)

## 2018-04-26 LAB — HEMOGLOBIN AND HEMATOCRIT, BLOOD
HCT: 34.4 % — ABNORMAL LOW (ref 39.0–52.0)
HEMOGLOBIN: 9.7 g/dL — AB (ref 13.0–17.0)

## 2018-04-26 LAB — PROCALCITONIN: Procalcitonin: 0.96 ng/mL

## 2018-04-26 LAB — MRSA PCR SCREENING: MRSA by PCR: NEGATIVE

## 2018-04-26 LAB — LIPASE, BLOOD: Lipase: 19 U/L (ref 11–51)

## 2018-04-26 MED ORDER — EPOETIN ALFA 10000 UNIT/ML IJ SOLN
10000.0000 [IU] | INTRAMUSCULAR | Status: DC
  Administered 2018-04-26 – 2018-04-28 (×2): 10000 [IU] via INTRAVENOUS
  Filled 2018-04-26: qty 1

## 2018-04-26 MED ORDER — ACETAMINOPHEN 650 MG RE SUPP
650.0000 mg | Freq: Four times a day (QID) | RECTAL | Status: DC | PRN
  Administered 2018-04-27: 650 mg via RECTAL
  Filled 2018-04-26: qty 1

## 2018-04-26 MED ORDER — SODIUM CHLORIDE 0.9 % IV SOLN
50.0000 ug/h | INTRAVENOUS | Status: DC
  Administered 2018-04-26 – 2018-04-27 (×3): 50 ug/h via INTRAVENOUS
  Filled 2018-04-26 (×6): qty 1

## 2018-04-26 MED ORDER — HEPARIN SODIUM (PORCINE) 1000 UNIT/ML DIALYSIS: 20.0000 [IU]/kg | INTRAMUSCULAR | Status: DC | PRN

## 2018-04-26 MED ORDER — OXYCODONE HCL 5 MG PO TABS
5.0000 mg | ORAL_TABLET | ORAL | Status: DC | PRN
  Administered 2018-04-26: 5 mg via ORAL
  Filled 2018-04-26: qty 1

## 2018-04-26 MED ORDER — SODIUM CHLORIDE 0.9% IV SOLUTION: Freq: Once | INTRAVENOUS | Status: DC

## 2018-04-26 MED ORDER — POLYETHYLENE GLYCOL 3350 17 G PO PACK: 17.0000 g | PACK | Freq: Every day | ORAL | Status: DC | PRN

## 2018-04-26 MED ORDER — ALBUTEROL SULFATE (2.5 MG/3ML) 0.083% IN NEBU: 2.5000 mg | INHALATION_SOLUTION | RESPIRATORY_TRACT | Status: DC | PRN

## 2018-04-26 MED ORDER — PIPERACILLIN-TAZOBACTAM 3.375 G IVPB
3.3750 g | Freq: Two times a day (BID) | INTRAVENOUS | Status: DC
  Administered 2018-04-26: 3.375 g via INTRAVENOUS
  Filled 2018-04-26: qty 50

## 2018-04-26 MED ORDER — LIDOCAINE VISCOUS HCL 2 % MT SOLN
15.0000 mL | Freq: Once | OROMUCOSAL | Status: AC
  Administered 2018-04-26: 15 mL via ORAL
  Filled 2018-04-26: qty 15

## 2018-04-26 MED ORDER — CHLORHEXIDINE GLUCONATE CLOTH 2 % EX PADS
6.0000 | MEDICATED_PAD | Freq: Every day | CUTANEOUS | Status: DC
  Administered 2018-04-27 – 2018-04-29 (×3): 6 via TOPICAL

## 2018-04-26 MED ORDER — SODIUM CHLORIDE 0.9 % IV SOLN
1.0000 g | Freq: Every day | INTRAVENOUS | Status: DC
  Filled 2018-04-26 (×2): qty 10

## 2018-04-26 MED ORDER — SODIUM CHLORIDE 0.9 % IV SOLN
80.0000 mg | Freq: Once | INTRAVENOUS | Status: AC
  Administered 2018-04-26: 80 mg via INTRAVENOUS
  Filled 2018-04-26: qty 80

## 2018-04-26 MED ORDER — SODIUM CHLORIDE 0.9% FLUSH
3.0000 mL | Freq: Two times a day (BID) | INTRAVENOUS | Status: DC
  Administered 2018-04-26 – 2018-04-29 (×5): 3 mL via INTRAVENOUS

## 2018-04-26 MED ORDER — ONDANSETRON HCL 4 MG/2ML IJ SOLN
4.0000 mg | Freq: Four times a day (QID) | INTRAMUSCULAR | Status: DC | PRN
  Administered 2018-04-26: 4 mg via INTRAVENOUS
  Filled 2018-04-26: qty 2

## 2018-04-26 MED ORDER — ORAL CARE MOUTH RINSE
15.0000 mL | Freq: Two times a day (BID) | OROMUCOSAL | Status: DC
  Administered 2018-04-26 – 2018-04-28 (×4): 15 mL via OROMUCOSAL

## 2018-04-26 MED ORDER — SODIUM CHLORIDE 0.9 % IV SOLN
1.0000 g | INTRAVENOUS | Status: DC
  Administered 2018-04-26: 1 g via INTRAVENOUS
  Filled 2018-04-26: qty 1
  Filled 2018-04-26: qty 10

## 2018-04-26 MED ORDER — ALUM & MAG HYDROXIDE-SIMETH 200-200-20 MG/5ML PO SUSP
30.0000 mL | Freq: Once | ORAL | Status: AC
  Administered 2018-04-26: 30 mL via ORAL
  Filled 2018-04-26: qty 30

## 2018-04-26 MED ORDER — ONDANSETRON HCL 4 MG PO TABS: 4.0000 mg | ORAL_TABLET | Freq: Four times a day (QID) | ORAL | Status: DC | PRN

## 2018-04-26 MED ORDER — HYDRALAZINE HCL 20 MG/ML IJ SOLN: 10.0000 mg | Freq: Four times a day (QID) | INTRAMUSCULAR | Status: DC | PRN

## 2018-04-26 MED ORDER — ONDANSETRON HCL 4 MG/2ML IJ SOLN
4.0000 mg | Freq: Once | INTRAMUSCULAR | Status: AC
  Administered 2018-04-26: 4 mg via INTRAVENOUS
  Filled 2018-04-26: qty 2

## 2018-04-26 MED ORDER — PROMETHAZINE HCL 25 MG/ML IJ SOLN
12.5000 mg | INTRAMUSCULAR | Status: DC | PRN
  Administered 2018-04-26: 12.5 mg via INTRAVENOUS
  Filled 2018-04-26: qty 1

## 2018-04-26 MED ORDER — SODIUM CHLORIDE 0.9 % IV SOLN
8.0000 mg/h | INTRAVENOUS | Status: DC
  Administered 2018-04-26 – 2018-04-28 (×5): 8 mg/h via INTRAVENOUS
  Filled 2018-04-26 (×5): qty 80

## 2018-04-26 MED ORDER — ACETAMINOPHEN 325 MG PO TABS
650.0000 mg | ORAL_TABLET | Freq: Four times a day (QID) | ORAL | Status: DC | PRN
  Administered 2018-04-28: 650 mg via ORAL
  Filled 2018-04-26: qty 2

## 2018-04-26 NOTE — ED Notes (Signed)
Attempted to call report x 1  

## 2018-04-26 NOTE — Progress Notes (Signed)
Pre HD assessment    04/26/18 1458  Neurological  Level of Consciousness Alert  Orientation Level Oriented X4 (some confusion )  Respiratory  Respiratory Pattern Regular;Dyspnea with exertion  Chest Assessment Chest expansion symmetrical  Cardiac  Pulse Regular  ECG Monitor Yes  Cardiac Rhythm NSR  Antiarrhythmic device No  Vascular  R Radial Pulse +2  L Radial Pulse +2  Edema Generalized  Integumentary  Integumentary (WDL) X  Skin Color Appropriate for ethnicity  Musculoskeletal  Musculoskeletal (WDL) X  Generalized Weakness Yes  Assistive Device None  GU Assessment  Genitourinary (WDL) X  Genitourinary Symptoms  (HD)  Psychosocial  Psychosocial (WDL) WDL

## 2018-04-26 NOTE — Progress Notes (Signed)
Pre HD assessment   04/26/18 1457  Vital Signs  Temp 97.8 F (36.6 C)  Temp Source Oral  Pulse Rate 86  Pulse Rate Source Monitor  Resp (!) 34  BP (!) 127/95  BP Location Left Arm  BP Method Automatic  Patient Position (if appropriate) Lying  Oxygen Therapy  SpO2 95 %  O2 Device Nasal Cannula  O2 Flow Rate (L/min) 4 L/min  Pain Assessment  Pain Scale 0-10  Pain Score 8  Pain Intervention(s) Repositioned;Environmental changes  Dialysis Weight  Weight 82.8 kg  Type of Weight Pre-Dialysis  Time-Out for Hemodialysis  What Procedure? HD  Pt Identifiers(min of two) First/Last Name;MRN/Account#  Correct Site? Yes  Correct Side? Yes  Correct Procedure? Yes  Consents Verified? Yes  Rad Studies Available? N/A  Safety Precautions Reviewed? Yes  Engineer, civil (consulting) Number  ([id number])  Station Number 1  UF/Alarm Test Passed  Conductivity: Meter 13.8  Conductivity: Machine  14  pH 7.2  Reverse Osmosis main  Normal Saline Lot Number [id number]  Dialyzer Lot Number [id number]  Disposable Set Lot Number [id number]  Machine Temperature 98.6 F (37 C)  Musician and Audible Yes  Blood Lines Intact and Secured Yes  Pre Treatment Patient Checks  Vascular access used during treatment Catheter  Hepatitis B Surface Antigen Results Negative  Date Hepatitis B Surface Antigen Drawn 06/16/17  Hepatitis B Surface Antibody 50 (>10)  Date Hepatitis B Surface Antibody Drawn 06/16/17  Hemodialysis Consent Verified Yes  Hemodialysis Standing Orders Initiated Yes  ECG (Telemetry) Monitor On Yes  Prime Ordered None  Length of  DialysisTreatment -hour(s) 3.5 Hour(s)  Dialyzer Elisio 17H NR  Dialysate 2K, 2.5 Ca  Dialysis Anticoagulant None  Dialysate Flow Ordered 600  Blood Flow Rate Ordered 400 mL/min  Ultrafiltration Goal 4 Liters  Pre Treatment Labs CBC;Other (Comment) (BMP)  Dialysis Blood Pressure Support Ordered Normal Saline  Education / Care Plan  Dialysis Education Provided Yes   Documented Education in Care Plan Yes  Hemodialysis Catheter Right Internal jugular Double-lumen  No Placement Date or Time found.   Placed prior to admission: Yes  Orientation: Right  Access Location: Internal jugular  Hemodialysis Catheter Type: Double-lumen  Site Condition No complications  Blue Lumen Status Heparin locked  Red Lumen Status Heparin locked  Purple Lumen Status N/A  Dressing Type Biopatch  Dressing Status Clean;Dry;Intact  Drainage Description None

## 2018-04-26 NOTE — Consult Note (Addendum)
Pharmacy Antibiotic Note  Nicolus Ose is a 58 y.o. male admitted on 04/26/2018 with spontaneous bacterial peritonitis.  Pharmacy has been consulted for Zosyn dosing.  Plan: Zosyn 3.375g IV q12h (4 hour infusion).  Height: 6\' 3"  (190.5 cm) Weight: 176 lb 2.4 oz (79.9 kg) IBW/kg (Calculated) : 84.5  Temp (24hrs), Avg:98.1 F (36.7 C), Min:97.8 F (36.6 C), Max:98.6 F (37 C)  Recent Labs  Lab 04/20/18 0425 04/26/18 0926  WBC 6.4 9.9  CREATININE  --  13.09*    Estimated Creatinine Clearance: 7 mL/min (A) (by C-G formula based on SCr of 13.09 mg/dL (H)).    No Known Allergies  Antimicrobials this admission: Zosyn 2/13 >>   Dose adjustments this admission:   Microbiology results: 2/13 MRSA PCR: negative  Thank you for allowing pharmacy to be a part of this patient's care.  Forrest Moron, PharmD Clinical Pharmacist 04/26/2018 8:00 PM

## 2018-04-26 NOTE — Progress Notes (Signed)
HD tx end    04/26/18 1844  Vital Signs  Pulse Rate 97  Pulse Rate Source Monitor  Resp 18  BP 119/72  BP Location Left Arm  BP Method Automatic  Patient Position (if appropriate) Lying  Oxygen Therapy  SpO2 98 %  O2 Device Non-rebreather Mask  During Hemodialysis Assessment  Dialysis Fluid Bolus Normal Saline  Bolus Amount (mL) 250 mL  Intra-Hemodialysis Comments Tx completed

## 2018-04-26 NOTE — Progress Notes (Signed)
Post HD assessment. Pt tolerated tx well without complication. Net UF 4008, goal met.    04/26/18 1852  Vital Signs  Temp 97.8 F (36.6 C)  Temp Source Oral  Pulse Rate (!) 101  Pulse Rate Source Monitor  Resp 18  BP 111/85  BP Location Left Arm  BP Method Automatic  Patient Position (if appropriate) Lying  Oxygen Therapy  SpO2 93 %  O2 Device Non-rebreather Mask  Dialysis Weight  Weight 79.9 kg  Type of Weight Post-Dialysis  Post-Hemodialysis Assessment  Rinseback Volume (mL) 250 mL  KECN 78.2 V  Dialyzer Clearance Lightly streaked  Duration of HD Treatment -hour(s) 3.5 hour(s)  Hemodialysis Intake (mL) 500 mL  UF Total -Machine (mL) 4508 mL  Net UF (mL) 4008 mL  Tolerated HD Treatment Yes  Education / Care Plan  Dialysis Education Provided Yes  Documented Education in Care Plan Yes  Hemodialysis Catheter Right Internal jugular Double-lumen  No Placement Date or Time found.   Placed prior to admission: Yes  Orientation: Right  Access Location: Internal jugular  Hemodialysis Catheter Type: Double-lumen  Site Condition No complications  Blue Lumen Status Heparin locked  Red Lumen Status Heparin locked  Purple Lumen Status N/A  Catheter fill solution Heparin 1000 units/ml  Catheter fill volume (Arterial) 1.5 cc  Catheter fill volume (Venous) 1.5  Dressing Type Biopatch  Dressing Status Clean;Dry;Intact;Dressing changed  Interventions New dressing  Drainage Description None  Dressing Change Due 05/03/18  Post treatment catheter status Capped and Clamped

## 2018-04-26 NOTE — ED Provider Notes (Signed)
[facility] Emergency Department Provider Note  Time seen: 9:27 AM  I have reviewed the triage vital signs and the nursing notes.   HISTORY  Chief Complaint GI bleed   HPI Stanley [name] is a 58 y.o. male with a past medical history of alcohol abuse, CHF, cirrhosis, CAD, ESRD on hemodialysis, presents to the emergency department for heartburn, nausea, vomiting dark appearing blood as well as seeing dark and bright red blood per rectum per patient.  States of GI bleeding as well as heartburn sensation started yesterday.  Patient states chronic heartburn, but denies bleeding in the past.  Patient states abdominal distention as well and states it is been 2 weeks since his last paracentesis.  Patient denies any fever.  Describes burning in the chest as heartburn sensation.  Described as moderate.  Presents via EMS, EMS denies any vomiting in route with them.   Past Medical History:  Diagnosis Date  . Alcohol abuse   . CHF (congestive heart failure) ([place])   . Cirrhosis ([place])   . Coronary artery disease 2009  . Drug abuse ([place])   . End stage renal disease on dialysis [facility]) NEPHROLOGIST-   DR [facility]  IN [place]   HEMODIALYSIS --   TUES/  THURS/  SAT  . Gastrointestinal bleed 06/13/2017   From chart...hx of multiple GI bleeds  . GERD (gastroesophageal reflux disease)   . Hyperlipidemia   . Hypertension   . PAD (peripheral artery disease) ([place])   . Renal insufficiency    Per pt, 32 oz fluid restriction per day  . S/P triple vessel bypass 06/09/2016   2009ish  . Suicidal ideation    & HOMICIDAL IDEATION --  06-16-2013   ADMITTED TO BEHAVIOR HEALTH    Patient Active Problem List   Diagnosis Date Noted  . PAD (peripheral artery disease) ([place]) 04/14/2018  . Healthcare-associated pneumonia 01/15/2018  . Acute on chronic respiratory failure ([place]) 01/15/2018  . Acute pulmonary edema ([place]) 12/23/2017  . Acute respiratory failure ([place]) 10/29/2017  . ESRD (end  stage renal disease) on dialysis ([place]) 07/28/2017  . Protein-calorie malnutrition, severe 06/14/2017  . Encounter for dialysis [facility])   . Palliative care by specialist   . Goals of care, counseling/discussion   . Malnutrition of moderate degree 06/05/2017  . Secondary esophageal varices without bleeding ([place])   . Stomach irritation   . Idiopathic esophageal varices without bleeding ([place])   . Alcoholic hepatitis with ascites 05/24/2017  . ESRD (end stage renal disease) ([place]) 04/28/2017  . Uremia 03/08/2017  . ESRD on hemodialysis ([place]) 03/03/2017  . Weakness 02/28/2017  . Hypocalcemia 02/22/2017  . Shortness of breath 11/26/2016  . COPD (chronic obstructive pulmonary disease) ([place]) 10/30/2016  . COPD exacerbation ([place]) 10/29/2016  . Anemia   . Heme positive stool   . Ulceration of intestine   . Benign neoplasm of transverse colon   . Acute gastrointestinal hemorrhage   . Esophageal candidiasis ([place])   . Angiodysplasia of intestinal tract   . Acute respiratory failure with hypoxia ([place]) 07/03/2016  . GI bleeding 06/24/2016  . Rectal bleeding 06/14/2016  . Anemia of chronic disease 06/01/2016  . MRSA carrier 06/01/2016  . Chronic renal failure 05/23/2016  . Ischemic heart disease 05/23/2016  . Angiodysplasia of small intestine   . Melena   . Small bowel bleed not requiring more than 4 units of blood in 24 hours, ICU, or surgery   . Anemia due to chronic blood loss   .  Abdominal pain 05/05/2016  . Acute posthemorrhagic anemia 04/17/2016  . Gastrointestinal bleed 04/17/2016  . History of esophagogastroduodenoscopy (EGD) 04/17/2016  . Elevated troponin 04/17/2016  . Alcohol abuse 04/17/2016  . Upper GI bleed 01/19/2016  . Blood in stool   . Angiodysplasia of stomach and duodenum with hemorrhage   . Gastritis   . Reflux esophagitis   . GI bleed 05/16/2015  . Acute GI bleeding   . Symptomatic anemia 04/30/2015  . HTN (hypertension) 04/06/2015  . GERD (gastroesophageal reflux  disease) 04/06/2015  . HLD (hyperlipidemia) 04/06/2015  . Dyspnea 04/06/2015  . Cirrhosis of liver with ascites ([place]) 04/06/2015  . Ascites 04/06/2015  . GIB (gastrointestinal bleeding) 03/23/2015  . Homicidal ideation 06/19/2013  . Suicidal intent 06/19/2013  . Homicidal ideations 06/19/2013  . Hyperkalemia 06/16/2013  . Mandible fracture ([place]) 06/05/2013  . Fracture, mandible ([place]) 06/02/2013  . Coronary atherosclerosis of native coronary artery 06/02/2013  . ESRD on dialysis ([place]) 06/02/2013  . Mandible open fracture ([place]) 06/02/2013    Past Surgical History:  Procedure Laterality Date  . A/V FISTULAGRAM Right 06/06/2017   Procedure: A/V FISTULAGRAM;  Surgeon: Katha Cabal, MD;  Location: [place] CV LAB;  Service: Cardiovascular;  Laterality: Right;  . A/V SHUNT INTERVENTION N/A 06/06/2017   Procedure: A/V SHUNT INTERVENTION;  Surgeon: Katha Cabal, MD;  Location: [place] CV LAB;  Service: Cardiovascular;  Laterality: N/A;  . AGILE CAPSULE N/A 06/19/2016   Procedure: AGILE CAPSULE;  Surgeon: [name], MD;  Location: [facility] ENDOSCOPY;  Service: Endoscopy;  Laterality: N/A;  . COLONOSCOPY WITH PROPOFOL N/A 06/18/2016   Procedure: COLONOSCOPY WITH PROPOFOL;  Surgeon: [name], MD;  Location: [facility] ENDOSCOPY;  Service: Endoscopy;  Laterality: N/A;  . COLONOSCOPY WITH PROPOFOL N/A 08/12/2016   Procedure: COLONOSCOPY WITH PROPOFOL;  Surgeon: [name], MD;  Location: [facility] ENDOSCOPY;  Service: Endoscopy;  Laterality: N/A;  . COLONOSCOPY WITH PROPOFOL N/A 05/05/2017   Procedure: COLONOSCOPY WITH PROPOFOL;  Surgeon: Manya Silvas, MD;  Location: [facility] ENDOSCOPY;  Service: Endoscopy;  Laterality: N/A;  . CORONARY ANGIOPLASTY  ?   PT UNABLE TO TELL IF  BEFORE OR AFTER  CABG  . CORONARY ARTERY BYPASS GRAFT  2008  ([place])   3 VESSEL  . DIALYSIS FISTULA CREATION  LAST SURGERY  APPOX  2008  . ENTEROSCOPY N/A 05/10/2016   Procedure: ENTEROSCOPY;  Surgeon: Jerene Bears, MD;  Location: [place];  Service: Gastroenterology;  Laterality: N/A;  . ENTEROSCOPY N/A 08/12/2016   Procedure: ENTEROSCOPY;  Surgeon: [name], MD;  Location: [facility] ENDOSCOPY;  Service: Endoscopy;  Laterality: N/A;  . ENTEROSCOPY Left 06/03/2017   Procedure: ENTEROSCOPY;  Surgeon: Virgel Manifold, MD;  Location: [facility] ENDOSCOPY;  Service: Endoscopy;  Laterality: Left;  Procedure date will ultimately depend on when patient is medically optimized before the procedure, pending hemodialysis and blood transfusions etc. Will place on schedule and change depending on clinical status.   . ENTEROSCOPY N/A 06/05/2017   Procedure: ENTEROSCOPY;  Surgeon: Virgel Manifold, MD;  Location: [facility] ENDOSCOPY;  Service: Endoscopy;  Laterality: N/A;  . ENTEROSCOPY N/A 06/15/2017   Procedure: Push ENTEROSCOPY;  Surgeon: [name], MD;  Location: [facility] ENDOSCOPY;  Service: Endoscopy;  Laterality: N/A;  . ESOPHAGOGASTRODUODENOSCOPY N/A 05/07/2015   Procedure: ESOPHAGOGASTRODUODENOSCOPY (EGD);  Surgeon: Hulen Luster, MD;  Location[facility] ENDOSCOPY;  Service: Endoscopy;  Laterality: N/A;  . ESOPHAGOGASTRODUODENOSCOPY (EGD) WITH PROPOFOL N/A 05/17/2015   Procedure: ESOPHAGOGASTRODUODENOSCOPY (EGD) WITH PROPOFOL;  Surgeon: [name], MD;  Location: [facility] ENDOSCOPY;  Service: Endoscopy;  Laterality: N/A;  . ESOPHAGOGASTRODUODENOSCOPY (EGD) WITH PROPOFOL N/A 01/20/2016   Procedure: ESOPHAGOGASTRODUODENOSCOPY (EGD) WITH PROPOFOL;  Surgeon: [name], MD;  Location: [facility] ENDOSCOPY;  Service: Endoscopy;  Laterality: N/A;  . ESOPHAGOGASTRODUODENOSCOPY (EGD) WITH PROPOFOL N/A 04/17/2016   Procedure: ESOPHAGOGASTRODUODENOSCOPY (EGD) WITH PROPOFOL;  Surgeon: Lin Landsman, MD;  Location: [facility] ENDOSCOPY;  Service: Gastroenterology;  Laterality: N/A;  . ESOPHAGOGASTRODUODENOSCOPY (EGD) WITH PROPOFOL  05/09/2016   Procedure: ESOPHAGOGASTRODUODENOSCOPY (EGD) WITH PROPOFOL;  Surgeon: Jerene Bears, MD;  Location: [place];  Service: Endoscopy;;  . ESOPHAGOGASTRODUODENOSCOPY (EGD) WITH PROPOFOL N/A 06/16/2016   Procedure: ESOPHAGOGASTRODUODENOSCOPY (EGD) WITH PROPOFOL;  Surgeon: [name], MD;  Location: [facility] ENDOSCOPY;  Service: Endoscopy;  Laterality: N/A;  . ESOPHAGOGASTRODUODENOSCOPY (EGD) WITH PROPOFOL N/A 05/05/2017   Procedure: ESOPHAGOGASTRODUODENOSCOPY (EGD) WITH PROPOFOL;  Surgeon: Manya Silvas, MD;  Location: [facility] ENDOSCOPY;  Service: Endoscopy;  Laterality: N/A;  . ESOPHAGOGASTRODUODENOSCOPY (EGD) WITH PROPOFOL N/A 06/15/2017   Procedure: ESOPHAGOGASTRODUODENOSCOPY (EGD) WITH PROPOFOL;  Surgeon: [name], MD;  Location: [facility] ENDOSCOPY;  Service: Endoscopy;  Laterality: N/A;  . GIVENS CAPSULE STUDY N/A 05/07/2016   Procedure: GIVENS CAPSULE STUDY;  Surgeon: Doran Stabler, MD;  Location: [place];  Service: Endoscopy;  Laterality: N/A;  . LOWER EXTREMITY ANGIOGRAPHY Left 04/19/2018   Procedure: LOWER EXTREMITY ANGIOGRAPHY;  Surgeon: Algernon Huxley, MD;  Location: [place] CV LAB;  Service: Cardiovascular;  Laterality: Left;  Marland Kitchen MANDIBULAR HARDWARE REMOVAL N/A 07/29/2013   Procedure: REMOVAL OF ARCH BARS;  Surgeon: Theodoro Kos, DO;  Location: [place];  Service: Plastics;  Laterality: N/A;  . ORIF MANDIBULAR FRACTURE N/A 06/05/2013   Procedure: REPAIR OF MANDIBULAR FRACTURE x 2 with maxillo-mandibular fixation ;  Surgeon: Theodoro Kos, DO;  Location: [place];  Service: Plastics;  Laterality: N/A;  . PARACENTESIS    . PERIPHERAL ARTERIAL STENT GRAFT Left     Prior to Admission medications   Medication Sig Start Date End Date Taking? Authorizing Provider  aspirin EC 81 MG tablet Take 1 tablet (81 mg total) by mouth daily. 04/20/18 04/20/19  Epifanio Lesches, MD  budesonide-formoterol (SYMBICORT) 160-4.5 MCG/ACT inhaler Inhale 2 puffs into the lungs daily. 11/16/16   Vaughan Basta, MD  calcium acetate (PHOSLO) 667 MG capsule Take 2,001 mg by mouth 3 (three)  times daily.    [provider]  clopidogrel (PLAVIX) 75 MG tablet Take 1 tablet (75 mg total) by mouth daily. 04/20/18 04/20/19  Epifanio Lesches, MD  folic acid (FOLVITE) 1 MG tablet Take 1 tablet (1 mg total) by mouth daily. 10/31/17   Fritzi Mandes, MD  gabapentin (NEURONTIN) 300 MG capsule Take 300 mg by mouth 3 (three) times daily.  08/16/16   [provider]  ipratropium-albuterol (DUONEB) 0.5-2.5 (3) MG/3ML SOLN Take 3 mLs by nebulization every 6 (six) hours as needed. 11/28/16   Loletha Grayer, MD  labetalol (NORMODYNE) 100 MG tablet Take 100 mg by mouth daily.    [provider]  multivitamin (RENA-VIT) TABS tablet Take 1 tablet by mouth at bedtime. 10/31/17   Fritzi Mandes, MD  omeprazole (PRILOSEC) 40 MG capsule Take 40 mg by mouth daily. 09/08/17   [provider]  oxyCODONE (OXY IR/ROXICODONE) 5 MG immediate release tablet Take 1 tablet (5 mg total) by mouth every 4 (four) hours as needed for moderate pain. 04/17/18   Epifanio Lesches, MD  tiotropium (SPIRIVA HANDIHALER) 18 MCG inhalation capsule Place  1 capsule (18 mcg total) into inhaler and inhale daily. 01/30/18 04/30/18  Demetrios Loll, MD  vitamin C (VITAMIN C) 250 MG tablet Take 1 tablet (250 mg total) by mouth 2 (two) times daily. 04/20/18   Epifanio Lesches, MD    No Known Allergies  Family History  Problem Relation Age of Onset  . Colon cancer Mother   . Cancer Father   . Cancer Sister   . Kidney disease Brother     Social History Social History   Tobacco Use  . Smoking status: Current Every Day Smoker    Packs/day: 0.15    Years: 40.00    Pack years: 6.00    Types: Cigarettes  . Smokeless tobacco: Never Used  Substance Use Topics  . Alcohol use: No    Comment: pt reports quitting after learning about cirrhosis  . Drug use: No    Frequency: 7.0 times per week    Types: Marijuana, Cocaine    Review of Systems Constitutional: Negative for fever. Cardiovascular: Positive for  chest discomfort/burning described as heartburn. Respiratory: Negative for shortness of breath. Gastrointestinal: Positive for abdominal distention/abdominal discomfort.  Positive for nausea and vomiting with dark appearing vomit and positive for bright red blood as well as dark appearing blood per rectum. Genitourinary: Negative for urinary compaints Musculoskeletal: Negative for musculoskeletal complaints Skin: Negative for skin complaints  Neurological: Negative for headache All other ROS negative  ____________________________________________   PHYSICAL EXAM:  VITAL SIGNS: ED Triage Vitals  Enc Vitals Group     BP --      Pulse --      Resp --      Temp --      Temp Source 04/26/18 0925 Oral     SpO2 --      Weight 04/26/18 0924 140 lb (63.5 kg)     Height 04/26/18 0924 6\' 3"  (1.905 m)     Head Circumference --      Peak Flow --      Pain Score --      Pain Loc --      Pain Edu? --      Excl. in [place]? --    Constitutional: Alert and oriented.  Mild discomfort due to "heartburn" per patient. Eyes: Normal exam ENT   Head: Normocephalic and atraumatic.   Mouth/Throat: Mucous membranes are moist. Cardiovascular: Normal rate, regular rhythm.  Respiratory: Normal respiratory effort without tachypnea nor retractions. Breath sounds are clear Gastrointestinal: Patient has a distended abdomen with dull percussion no significant tenderness on exam. Musculoskeletal: Nontender with normal range of motion in all extremities. Neurologic:  Normal speech and language. No gross focal neurologic deficits Skin:  Skin is warm, dry and intact.  Psychiatric: Mood and affect are normal.   ____________________________________________    EKG  EKG viewed and interpreted by myself shows a normal sinus rhythm at 90 bpm with a slightly widened QRS, right axis deviation, largely normal intervals besides slight PR prolongation, nonspecific ST  changes.  ____________________________________________    RADIOLOGY  IMPRESSION: Mild fluid overload/early interstitial edema. Mild atelectasis at the left base.  ____________________________________________   INITIAL IMPRESSION / ASSESSMENT AND PLAN / ED COURSE  Pertinent labs & imaging results that were available during my care of the patient were reviewed by me and considered in my medical decision making (see chart for details).  Patient presents to the emergency department with concerns for GI bleed states he has been vomiting since yesterday with dark appearing vomit with  occasional bright red blood as well as dark appearing stool since yesterday.  Also describes abdominal distention and states his been 2 weeks since his last paracentesis.  Patient is also complaining of a moderate heartburn sensation, states this is chronic.  We will check labs including a type and screen.  Perform a rectal examination, obtain cardiac labs given his heartburn complaint as well as dose a GI cocktail.  Patient agreeable to plan of care.  Patient's work-up shows a mild drop in hemoglobin, potassium is up to 6.0 with a BUN of 77.  Patient is hypoxic on room air, satting in the low 90s on several liters of oxygen.  Rectal examination shows strongly guaiac positive a mixture of melena/hematochezia.  Patient is currently on octreotide and Protonix drips.  Patient will be admitted to the hospitalist service.  GI medicine Dr. Allen Norris is aware of his admission.  I discussed with Dr. Juleen China of nephrology as well to arrange for dialysis today.   CRITICAL CARE Performed by: Harvest Dark   Total critical care time: 30 minutes  Critical care time was exclusive of separately billable procedures and treating other patients.  Critical care was necessary to treat or prevent imminent or life-threatening deterioration.  Critical care was time spent personally by me on the following activities: development of  treatment plan with patient and/or surrogate as well as nursing, discussions with consultants, evaluation of patient's response to treatment, examination of patient, obtaining history from patient or surrogate, ordering and performing treatments and interventions, ordering and review of laboratory studies, ordering and review of radiographic studies, pulse oximetry and re-evaluation of patient's condition.   ____________________________________________   FINAL CLINICAL IMPRESSION(S) / ED DIAGNOSES  Abdominal distention GI bleed   Harvest Dark, MD 04/26/18 1024

## 2018-04-26 NOTE — ED Triage Notes (Signed)
Pt presents to ED via [facility] with c/o hematemesis, rectal bleeding, and abdominal distention. Per EMS 2 weeks since patient had paracentesis. Pt began vomiting black blood last night, c/o bright red blood in his stool since yesterday. Pt also states has not had dialysis in 2 weeks. Pt is alert and oriented at this time.

## 2018-04-26 NOTE — H&P (Signed)
[facility] NAME: Stanley [name]    MR#:  [id number]  DATE OF BIRTH:  1960/11/23  DATE OF ADMISSION:  04/26/2018  PRIMARY CARE PHYSICIAN: Alene Mires Elyse Jarvis, MD   REQUESTING/REFERRING PHYSICIAN: Dr. Kerman Passey  CHIEF COMPLAINT:   Chief Complaint  Patient presents with  . Hematemesis  . Abdominal Pain    HISTORY OF PRESENT ILLNESS:  Stanley [name]  is a 58 y.o. male with a known history of end-stage renal disease on hemodialysis, hypertension, cirrhosis, gastric varices, noncompliance, multiple EGDs and colonoscopies presents to the emergency room complaining of coffee-ground emesis for 2 days.  Rectal exam in the ER showed melena and blood in the rectal vault.  Patient started on octreotide drip and Protonix drip at this point.  Hemoglobin is 9 down from 11.  His blood pressure seems stable but normally his blood pressure runs in the 180s to 200s due to noncompliance.  He complains of chronic lower extremity pain and chronic abdominal pain.  He also has chronic ascites for which he gets paracentesis every 2 weeks.  Last paracentesis was 2 weeks back. Does not take any NSAIDs.  PAST MEDICAL HISTORY:   Past Medical History:  Diagnosis Date  . Alcohol abuse   . CHF (congestive heart failure) ([place])   . Cirrhosis ([place])   . Coronary artery disease 2009  . Drug abuse ([place])   . End stage renal disease on dialysis [facility] NEPHROLOGIST-   DR [facility]  IN [place]   HEMODIALYSIS --   TUES/  THURS/  SAT  . Gastrointestinal bleed 06/13/2017   From chart...hx of multiple GI bleeds  . GERD (gastroesophageal reflux disease)   . Hyperlipidemia   . Hypertension   . PAD (peripheral artery disease) ([place])   . Renal insufficiency    Per pt, 32 oz fluid restriction per day  . S/P triple vessel bypass 06/09/2016   2009ish  . Suicidal ideation    & HOMICIDAL IDEATION --  06-16-2013   ADMITTED TO BEHAVIOR HEALTH    PAST SURGICAL HISTORY:    Past Surgical History:  Procedure Laterality Date  . A/V FISTULAGRAM Right 06/06/2017   Procedure: A/V FISTULAGRAM;  Surgeon: Katha Cabal, MD;  Location: [place] CV LAB;  Service: Cardiovascular;  Laterality: Right;  . A/V SHUNT INTERVENTION N/A 06/06/2017   Procedure: A/V SHUNT INTERVENTION;  Surgeon: Katha Cabal, MD;  Location: [place] CV LAB;  Service: Cardiovascular;  Laterality: N/A;  . AGILE CAPSULE N/A 06/19/2016   Procedure: AGILE CAPSULE;  Surgeon: [name], MD;  Location: [facility] ENDOSCOPY;  Service: Endoscopy;  Laterality: N/A;  . COLONOSCOPY WITH PROPOFOL N/A 06/18/2016   Procedure: COLONOSCOPY WITH PROPOFOL;  Surgeon: [name], MD;  Location: [facility] ENDOSCOPY;  Service: Endoscopy;  Laterality: N/A;  . COLONOSCOPY WITH PROPOFOL N/A 08/12/2016   Procedure: COLONOSCOPY WITH PROPOFOL;  Surgeon: [name], MD;  Location: [facility] ENDOSCOPY;  Service: Endoscopy;  Laterality: N/A;  . COLONOSCOPY WITH PROPOFOL N/A 05/05/2017   Procedure: COLONOSCOPY WITH PROPOFOL;  Surgeon: Manya Silvas, MD;  Location: [facility] ENDOSCOPY;  Service: Endoscopy;  Laterality: N/A;  . CORONARY ANGIOPLASTY  ?   PT UNABLE TO TELL IF  BEFORE OR AFTER  CABG  . CORONARY ARTERY BYPASS GRAFT  2008  ([place] , [place])   3 VESSEL  . DIALYSIS FISTULA CREATION  LAST SURGERY  APPOX  2008  . ENTEROSCOPY N/A 05/10/2016   Procedure: ENTEROSCOPY;  Surgeon: Jerene Bears,  MD;  Location: [place];  Service: Gastroenterology;  Laterality: N/A;  . ENTEROSCOPY N/A 08/12/2016   Procedure: ENTEROSCOPY;  Surgeon: [name], MD;  Location: [facility] ENDOSCOPY;  Service: Endoscopy;  Laterality: N/A;  . ENTEROSCOPY Left 06/03/2017   Procedure: ENTEROSCOPY;  Surgeon: Virgel Manifold, MD;  Location: [facility] ENDOSCOPY;  Service: Endoscopy;  Laterality: Left;  Procedure date will ultimately depend on when patient is medically optimized before the procedure, pending hemodialysis and blood transfusions etc. Will place on  schedule and change depending on clinical status.   . ENTEROSCOPY N/A 06/05/2017   Procedure: ENTEROSCOPY;  Surgeon: Virgel Manifold, MD;  Location: [facility] ENDOSCOPY;  Service: Endoscopy;  Laterality: N/A;  . ENTEROSCOPY N/A 06/15/2017   Procedure: Push ENTEROSCOPY;  Surgeon: [name], MD;  Location: [facility] ENDOSCOPY;  Service: Endoscopy;  Laterality: N/A;  . ESOPHAGOGASTRODUODENOSCOPY N/A 05/07/2015   Procedure: ESOPHAGOGASTRODUODENOSCOPY (EGD);  Surgeon: Hulen Luster, MD;  Location: [facility] ENDOSCOPY;  Service: Endoscopy;  Laterality: N/A;  . ESOPHAGOGASTRODUODENOSCOPY (EGD) WITH PROPOFOL N/A 05/17/2015   Procedure: ESOPHAGOGASTRODUODENOSCOPY (EGD) WITH PROPOFOL;  Surgeon: [name], MD;  Location: [facility] ENDOSCOPY;  Service: Endoscopy;  Laterality: N/A;  . ESOPHAGOGASTRODUODENOSCOPY (EGD) WITH PROPOFOL N/A 01/20/2016   Procedure: ESOPHAGOGASTRODUODENOSCOPY (EGD) WITH PROPOFOL;  Surgeon: [name], MD;  Location: [facility] ENDOSCOPY;  Service: Endoscopy;  Laterality: N/A;  . ESOPHAGOGASTRODUODENOSCOPY (EGD) WITH PROPOFOL N/A 04/17/2016   Procedure: ESOPHAGOGASTRODUODENOSCOPY (EGD) WITH PROPOFOL;  Surgeon: Lin Landsman, MD;  Location: [facility] ENDOSCOPY;  Service: Gastroenterology;  Laterality: N/A;  . ESOPHAGOGASTRODUODENOSCOPY (EGD) WITH PROPOFOL  05/09/2016   Procedure: ESOPHAGOGASTRODUODENOSCOPY (EGD) WITH PROPOFOL;  Surgeon: Jerene Bears, MD;  Location: [place];  Service: Endoscopy;;  . ESOPHAGOGASTRODUODENOSCOPY (EGD) WITH PROPOFOL N/A 06/16/2016   Procedure: ESOPHAGOGASTRODUODENOSCOPY (EGD) WITH PROPOFOL;  Surgeon: [name], MD;  Location: [facility] ENDOSCOPY;  Service: Endoscopy;  Laterality: N/A;  . ESOPHAGOGASTRODUODENOSCOPY (EGD) WITH PROPOFOL N/A 05/05/2017   Procedure: ESOPHAGOGASTRODUODENOSCOPY (EGD) WITH PROPOFOL;  Surgeon: Manya Silvas, MD;  Location: [facility] ENDOSCOPY;  Service: Endoscopy;  Laterality: N/A;  . ESOPHAGOGASTRODUODENOSCOPY (EGD) WITH PROPOFOL N/A 06/15/2017   Procedure:  ESOPHAGOGASTRODUODENOSCOPY (EGD) WITH PROPOFOL;  Surgeon: [name], MD;  Location: [facility] ENDOSCOPY;  Service: Endoscopy;  Laterality: N/A;  . GIVENS CAPSULE STUDY N/A 05/07/2016   Procedure: GIVENS CAPSULE STUDY;  Surgeon: Doran Stabler, MD;  Location: [place];  Service: Endoscopy;  Laterality: N/A;  . LOWER EXTREMITY ANGIOGRAPHY Left 04/19/2018   Procedure: LOWER EXTREMITY ANGIOGRAPHY;  Surgeon: Algernon Huxley, MD;  Location: [place] CV LAB;  Service: Cardiovascular;  Laterality: Left;  Marland Kitchen MANDIBULAR HARDWARE REMOVAL N/A 07/29/2013   Procedure: REMOVAL OF ARCH BARS;  Surgeon: Theodoro Kos, DO;  Location: [place];  Service: Plastics;  Laterality: N/A;  . ORIF MANDIBULAR FRACTURE N/A 06/05/2013   Procedure: REPAIR OF MANDIBULAR FRACTURE x 2 with maxillo-mandibular fixation ;  Surgeon: Theodoro Kos, DO;  Location: [place];  Service: Plastics;  Laterality: N/A;  . PARACENTESIS    . PERIPHERAL ARTERIAL STENT GRAFT Left     SOCIAL HISTORY:   Social History   Tobacco Use  . Smoking status: Current Every Day Smoker    Packs/day: 0.15    Years: 40.00    Pack years: 6.00    Types: Cigarettes  . Smokeless tobacco: Never Used  Substance Use Topics  . Alcohol use: No    Comment: pt reports quitting after learning about cirrhosis    FAMILY HISTORY:   Family History  Problem Relation  Age of Onset  . Colon cancer Mother   . Cancer Father   . Cancer Sister   . Kidney disease Brother     DRUG ALLERGIES:  No Known Allergies  REVIEW OF SYSTEMS:   Review of Systems  Constitutional: Positive for malaise/fatigue. Negative for chills and fever.  HENT: Negative for sore throat.   Eyes: Negative for blurred vision, double vision and pain.  Respiratory: Negative for cough, hemoptysis, shortness of breath and wheezing.   Cardiovascular: Negative for chest pain, palpitations, orthopnea and leg swelling.  Gastrointestinal: Positive for abdominal pain, blood in stool  and vomiting. Negative for constipation, diarrhea, heartburn and nausea.  Genitourinary: Negative for dysuria and hematuria.  Musculoskeletal: Positive for joint pain. Negative for back pain.  Skin: Negative for rash.  Neurological: Negative for sensory change, speech change, focal weakness and headaches.  Endo/Heme/Allergies: Does not bruise/bleed easily.  Psychiatric/Behavioral: Negative for depression. The patient is not nervous/anxious.     MEDICATIONS AT HOME:   Prior to Admission medications   Medication Sig Start Date End Date Taking? Authorizing Provider  aspirin EC 81 MG tablet Take 1 tablet (81 mg total) by mouth daily. 04/20/18 04/20/19  Epifanio Lesches, MD  budesonide-formoterol (SYMBICORT) 160-4.5 MCG/ACT inhaler Inhale 2 puffs into the lungs daily. 11/16/16   Vaughan Basta, MD  calcium acetate (PHOSLO) 667 MG capsule Take 2,001 mg by mouth 3 (three) times daily.    [provider]  clopidogrel (PLAVIX) 75 MG tablet Take 1 tablet (75 mg total) by mouth daily. 04/20/18 04/20/19  Epifanio Lesches, MD  folic acid (FOLVITE) 1 MG tablet Take 1 tablet (1 mg total) by mouth daily. 10/31/17   Fritzi Mandes, MD  gabapentin (NEURONTIN) 300 MG capsule Take 300 mg by mouth 3 (three) times daily.  08/16/16   [provider]  ipratropium-albuterol (DUONEB) 0.5-2.5 (3) MG/3ML SOLN Take 3 mLs by nebulization every 6 (six) hours as needed. 11/28/16   Loletha Grayer, MD  labetalol (NORMODYNE) 100 MG tablet Take 100 mg by mouth daily.    [provider]  multivitamin (RENA-VIT) TABS tablet Take 1 tablet by mouth at bedtime. 10/31/17   Fritzi Mandes, MD  omeprazole (PRILOSEC) 40 MG capsule Take 40 mg by mouth daily. 09/08/17   [provider]  oxyCODONE (OXY IR/ROXICODONE) 5 MG immediate release tablet Take 1 tablet (5 mg total) by mouth every 4 (four) hours as needed for moderate pain. 04/17/18   Epifanio Lesches, MD  tiotropium (SPIRIVA HANDIHALER) 18 MCG  inhalation capsule Place 1 capsule (18 mcg total) into inhaler and inhale daily. 01/30/18 04/30/18  Demetrios Loll, MD  vitamin C (VITAMIN C) 250 MG tablet Take 1 tablet (250 mg total) by mouth 2 (two) times daily. 04/20/18   Epifanio Lesches, MD     VITAL SIGNS:  Blood pressure (!) 146/93, pulse 92, temperature 98.6 F (37 C), temperature source Oral, resp. rate 19, height 6\' 3"  (1.905 m), weight 63.5 kg, SpO2 (!) 86 %.  PHYSICAL EXAMINATION:  Physical Exam  GENERAL:  58 y.o.-year-old patient lying in the bed with distress due to pain EYES: Pupils equal, round, reactive to light and accommodation. No scleral icterus. Extraocular muscles intact.  HEENT: Head atraumatic, normocephalic. Oropharynx and nasopharynx clear. No oropharyngeal erythema, moist oral mucosa  NECK:  Supple, no jugular venous distention. No thyroid enlargement, no tenderness.  LUNGS: Normal breath sounds bilaterally, no wheezing, rales, rhonchi. No use of accessory muscles of respiration.  CARDIOVASCULAR: S1, S2 normal. No murmurs, rubs,  or gallops.  ABDOMEN: Firm, distended.  No tenderness EXTREMITIES: No pedal edema, cyanosis, or clubbing. + 2 pedal & radial pulses b/l.   NEUROLOGIC: Cranial nerves II through XII are intact. No focal Motor or sensory deficits appreciated b/l PSYCHIATRIC: The patient is alert and oriented x 3.  Anxious SKIN: No obvious rash, lesion, or ulcer.   LABORATORY PANEL:   CBC Recent Labs  Lab 04/26/18 0926  WBC 9.9  HGB 9.2*  HCT 31.8*  PLT 366   ------------------------------------------------------------------------------------------------------------------  Chemistries  Recent Labs  Lab 04/26/18 0926  NA 140  K 6.0*  CL 83*  CO2 42*  GLUCOSE 116*  BUN 77*  CREATININE 13.09*  CALCIUM 9.6  AST 26  ALT 5  ALKPHOS 82  BILITOT 0.9   ------------------------------------------------------------------------------------------------------------------  Cardiac Enzymes Recent  Labs  Lab 04/26/18 0926  TROPONINI 0.06*   ------------------------------------------------------------------------------------------------------------------  RADIOLOGY:  Dg Chest Portable 1 View  Result Date: 04/26/2018 CLINICAL DATA:  Hematemesis.  Abdominal distension.  Ascites. EXAM: PORTABLE CHEST 1 VIEW COMPARISON:  04/09/2018 FINDINGS: Previous median sternotomy. Cardiomegaly. Aortic atherosclerosis. Right internal jugular central line tip in the SVC 3 cm above the right atrium. Pulmonary venous hypertension with mild interstitial edema. Mild atelectasis at the left lung base. No lobar collapse. No visible effusion. IMPRESSION: Mild fluid overload/early interstitial edema. Mild atelectasis at the left base. Electronically Signed   By: Nelson Chimes M.D.   On: 04/26/2018 10:07     IMPRESSION AND PLAN:   *Upper GI bleed with coffee-ground emesis and blood in rectal vault/melena. Patient with history of varices with cirrhosis.  Will start octreotide and Protonix drip.  IV ceftriaxone. Associated acute blood loss anemia.  Transfuse if hypotensive or drop in hemoglobin.  Admit to stepdown unit.  Discussed with Dr. Allen Norris of GI.  N.p.o.  *[name].  Missed his hemodialysis yesterday.  Will consult nephrology for dialysis during hospitalization.  Also has associated hyperkalemia.  Will need telemetry monitoring.  *Chronic ascites with recurrent paracentesis.  Will need to consult IR once stable for paracentesis.  *Hypertension.  Patient is n.p.o.  I have not ordered his oral medications.  Added IV hydralazine PRN.  *Chronic lower extremity pain with recent ischemia and intervention.  Will need to hold antiplatelet therapy due to GI bleed.  *CAD.  Stable.  *Tobacco abuse.  Counseled to quit smoking for greater than 3 minutes.  *DVT prophylaxis with SCDs.  All the records are reviewed and case discussed with ED provider. Management plans discussed with the  patient, family and they are in agreement.  CODE STATUS: Full code  Discussed regarding CODE STATUS.  Patient has no documented healthcare power of attorney.  He wishes to be full code.  TOTAL CRITICAL CARE TIME TAKING CARE OF THIS PATIENT: 80 minutes.   Leia Alf Lizandra Zakrzewski M.D on 04/26/2018 at 11:52 AM  Between 7am to 6pm - Pager - [phone]  After 6pm go to [email or web address] - password [email or web address] [facility]  Office  [phone]  CC: Primary care physician; [name], MD  Note: This dictation was prepared with Dragon dictation along with smaller phrase technology. Any transcriptional errors that result from this process are unintentional.

## 2018-04-26 NOTE — Progress Notes (Signed)
HD tx start   04/26/18 1504  Vital Signs  Pulse Rate 86  Pulse Rate Source Monitor  Resp 19  BP 133/79  BP Location Left Arm  BP Method Automatic  Patient Position (if appropriate) Lying  Oxygen Therapy  SpO2 100 %  O2 Device Nasal Cannula  O2 Flow Rate (L/min) 4 L/min  During Hemodialysis Assessment  Blood Flow Rate (mL/min) 400 mL/min  Arterial Pressure (mmHg) -150 mmHg  Venous Pressure (mmHg) 150 mmHg  Transmembrane Pressure (mmHg) 70 mmHg  Ultrafiltration Rate (mL/min) 1290 mL/min  Dialysate Flow Rate (mL/min) 600 ml/min  Conductivity: Machine  13.7  HD Safety Checks Performed Yes  Dialysis Fluid Bolus Normal Saline  Bolus Amount (mL) 250 mL  Intra-Hemodialysis Comments Tx initiated  Hemodialysis Catheter Right Internal jugular Double-lumen  No Placement Date or Time found.   Placed prior to admission: Yes  Orientation: Right  Access Location: Internal jugular  Hemodialysis Catheter Type: Double-lumen  Blue Lumen Status Infusing  Red Lumen Status Infusing

## 2018-04-26 NOTE — Progress Notes (Addendum)
During HD, it is reported that pt began to vomit up dark colored emesis (possibly coffee ground).  After that pt with hypoxia and altered mental status, of which he was placed on NRB mask.  Have ordered stat ABG, CXR,  CT Head, CBC, CMP.  Upon arrival to ICU pt is in no acute distress, vital signs stable, O2 sats 99% on NRB, no active vomiting.  Pt is lethargic, will arouse to voice and follow simple commands, but does have poor attention.  No focal deficits noted, speech clear.  Awaiting test results.  Will obtain CBG to assess for hypoglycemia.  Awaiting CXR, given possibility of aspiration and pt with Cirrhosis (currently on Rocephin), will change antibiotics to Zosyn to cover both Aspiration PNA and Spontaneous Bacterial Peritonitis.    Darel Hong, AGACNP-BC [facility] Pager: [phone] Cell: [phone]

## 2018-04-26 NOTE — Consult Note (Addendum)
Name: Stanley [name] MRN: [id number] DOB: 1960/10/22    ADMISSION DATE:  04/26/2018 CONSULTATION DATE: 04/26/2018  REFERRING MD : Dr. Darvin Neighbours   CHIEF COMPLAINT: GI Bleed   BRIEF PATIENT DESCRIPTION: 58 yo male with hx of ESRD on HD and Alcoholic Cirrhosis admitted with hyperkalemia after missing HD session and GI bleed   SIGNIFICANT EVENTS/STUDIES:  02/13-Pt admitted to the stepdown unit   HISTORY OF PRESENT ILLNESS:   This is a 58 yo male with a PMH of Suicidal Ideation, CAD, CABG (06/09/2016), PAD, HTN, Hyperlipidemia, Alcoholic Cirrhosis, Ascites requiring Paracentesis, GERD, Upper GI Bleed (Multiple EGD's), ESRD on HD secondary to Alport's Syndrome (T-Th-Sat), Noncompliance, Chronic Diastolic CHF (EF 19%-62% 22/9798), Polysubstance and ETOH Abuse.  He presented to [facility] ER on 02/13 via EMS with heart burn, hematemesis (dark emesis), and melena onset of symptoms 02/12.  He also endorses abdominal distension.  According to the pt he required a paracentesis 2 weeks ago. In the ER lab results revealed K+ 6.0, CO2 42, BUN 77, creatinine 13.09, lipase 19, troponin 0.06, and hgb 9.2.  Per ER notes the pt missed his HD session on 02/11 after being removed from the transportation bus. He received a GI cocktail, protonix gtt, and octreotide gtt.   He was subsequently admitted to the stepdown unit by hospitalist team for additional workup and treatment.    PAST MEDICAL HISTORY :   has a past medical history of Alcohol abuse, CHF (congestive heart failure) ([place]), Cirrhosis ([place]), Coronary artery disease (2009), Drug abuse ([place]), End stage renal disease on dialysis [facility]) (NEPHROLOGIST-   DR Goff), Gastrointestinal bleed (06/13/2017), GERD (gastroesophageal reflux disease), Hyperlipidemia, Hypertension, PAD (peripheral artery disease) ([place]), Renal insufficiency, S/P triple vessel bypass (06/09/2016), and Suicidal ideation.  has a past surgical history that includes Dialysis fistula  creation (LAST SURGERY  APPOX  2008); ORIF mandibular fracture (N/A, 06/05/2013); Coronary artery bypass graft (2008  ([place] , [place])); Mandibular hardware removal (N/A, 07/29/2013); Esophagogastroduodenoscopy (N/A, 05/07/2015); Esophagogastroduodenoscopy (egd) with propofol (N/A, 05/17/2015); Esophagogastroduodenoscopy (egd) with propofol (N/A, 01/20/2016); Coronary angioplasty (?); Peripheral arterial stent graft (Left); Esophagogastroduodenoscopy (egd) with propofol (N/A, 04/17/2016); Givens capsule study (N/A, 05/07/2016); Esophagogastroduodenoscopy (egd) with propofol (05/09/2016); enteroscopy (N/A, 05/10/2016); Esophagogastroduodenoscopy (egd) with propofol (N/A, 06/16/2016); Colonoscopy with propofol (N/A, 06/18/2016); Agile capsule (N/A, 06/19/2016); Colonoscopy with propofol (N/A, 08/12/2016); enteroscopy (N/A, 08/12/2016); Esophagogastroduodenoscopy (egd) with propofol (N/A, 05/05/2017); Colonoscopy with propofol (N/A, 05/05/2017); enteroscopy (Left, 06/03/2017); enteroscopy (N/A, 06/05/2017); A/V Fistulagram (Right, 06/06/2017); A/V SHUNT INTERVENTION (N/A, 06/06/2017); enteroscopy (N/A, 06/15/2017); Esophagogastroduodenoscopy (egd) with propofol (N/A, 06/15/2017); Paracentesis; and Lower Extremity Angiography (Left, 04/19/2018). Prior to Admission medications   Medication Sig Start Date End Date Taking? Authorizing Provider  aspirin EC 81 MG tablet Take 1 tablet (81 mg total) by mouth daily. 04/20/18 04/20/19  Epifanio Lesches, MD  budesonide-formoterol (SYMBICORT) 160-4.5 MCG/ACT inhaler Inhale 2 puffs into the lungs daily. 11/16/16   Vaughan Basta, MD  calcium acetate (PHOSLO) 667 MG capsule Take 2,001 mg by mouth 3 (three) times daily.    [provider]  clopidogrel (PLAVIX) 75 MG tablet Take 1 tablet (75 mg total) by mouth daily. 04/20/18 04/20/19  Epifanio Lesches, MD  folic acid (FOLVITE) 1 MG tablet Take 1 tablet (1 mg total) by mouth daily. 10/31/17   Fritzi Mandes, MD  gabapentin (NEURONTIN) 300 MG capsule  Take 300 mg by mouth 3 (three) times daily.  08/16/16   [provider]  ipratropium-albuterol (DUONEB) 0.5-2.5 (3) MG/3ML SOLN Take 3 mLs  by nebulization every 6 (six) hours as needed. 11/28/16   Loletha Grayer, MD  labetalol (NORMODYNE) 100 MG tablet Take 100 mg by mouth daily.    [provider]  multivitamin (RENA-VIT) TABS tablet Take 1 tablet by mouth at bedtime. 10/31/17   Fritzi Mandes, MD  omeprazole (PRILOSEC) 40 MG capsule Take 40 mg by mouth daily. 09/08/17   [provider]  oxyCODONE (OXY IR/ROXICODONE) 5 MG immediate release tablet Take 1 tablet (5 mg total) by mouth every 4 (four) hours as needed for moderate pain. 04/17/18   Epifanio Lesches, MD  tiotropium (SPIRIVA HANDIHALER) 18 MCG inhalation capsule Place 1 capsule (18 mcg total) into inhaler and inhale daily. 01/30/18 04/30/18  Demetrios Loll, MD  vitamin C (VITAMIN C) 250 MG tablet Take 1 tablet (250 mg total) by mouth 2 (two) times daily. 04/20/18   Epifanio Lesches, MD   No Known Allergies  FAMILY HISTORY:  family history includes Cancer in his father and sister; Colon cancer in his mother; Kidney disease in his brother. SOCIAL HISTORY:  reports that he has been smoking cigarettes. He has a 6.00 pack-year smoking history. He has never used smokeless tobacco. He reports that he does not drink alcohol or use drugs.  REVIEW OF SYSTEMS: Positives in BOLD  Constitutional: Negative for fever, chills, weight loss, malaise/fatigue and diaphoresis.  HENT: Negative for hearing loss, ear pain, nosebleeds, congestion, sore throat, neck pain, tinnitus and ear discharge.   Eyes: Negative for blurred vision, double vision, photophobia, pain, discharge and redness.  Respiratory: cough, hemoptysis, sputum production, shortness of breath, wheezing and stridor.   Cardiovascular: Negative for chest pain, palpitations, orthopnea, claudication, leg swelling and PND.  Gastrointestinal: heartburn, nausea, hematemesis,  vomiting, abdominal pain, diarrhea, constipation, blood in stool and melena.  Genitourinary: Negative for dysuria, urgency, frequency, hematuria and flank pain.  Musculoskeletal: Negative for myalgias, back pain, joint pain and falls.  Skin: Negative for itching and rash.  Neurological: Negative for dizziness, tingling, tremors, sensory change, speech change, focal weakness, seizures, loss of consciousness, weakness and headaches.  Endo/Heme/Allergies: Negative for environmental allergies and polydipsia. Does not bruise/bleed easily.  SUBJECTIVE:  c/o mild shortness of breath   VITAL SIGNS: Temp:  [98.6 F (37 C)] 98.6 F (37 C) (02/13 0925) Pulse Rate:  [92] 92 (02/13 0925) Resp:  [19] 19 (02/13 0925) BP: (146)/(93) 146/93 (02/13 0925) SpO2:  [86 %-96 %] 86 % (02/13 0932) Weight:  [63.5 kg] 63.5 kg (02/13 0924)  PHYSICAL EXAMINATION: General: well developed, well nourished male, NAD  Neuro: alert and oriented, follows commands  HEENT: supple, no JVD  Cardiovascular: nsr, rrr, no R/G  Lungs: diminished throughout, even, non labored  Abdomen: hypoactive BS x4, distended, non tender  Musculoskeletal: 2+ bilateral lower extremity edema  Skin: intact no rashes or lesions present   Recent Labs  Lab 04/19/18 1341 04/26/18 0926  NA  --  140  K 4.5 6.0*  CL  --  83*  CO2  --  42*  BUN  --  77*  CREATININE  --  13.09*  GLUCOSE  --  116*   Recent Labs  Lab 04/20/18 0425 04/26/18 0926  HGB 10.3* 9.2*  HCT 36.3* 31.8*  WBC 6.4 9.9  PLT 306 366   Dg Chest Portable 1 View  Result Date: 04/26/2018 CLINICAL DATA:  Hematemesis.  Abdominal distension.  Ascites. EXAM: PORTABLE CHEST 1 VIEW COMPARISON:  04/09/2018 FINDINGS: Previous median sternotomy. Cardiomegaly. Aortic atherosclerosis. Right internal jugular central line tip  in the SVC 3 cm above the right atrium. Pulmonary venous hypertension with mild interstitial edema. Mild atelectasis at the left lung base. No lobar collapse.  No visible effusion. IMPRESSION: Mild fluid overload/early interstitial edema. Mild atelectasis at the left base. Electronically Signed   By: Nelson Chimes M.D.   On: 04/26/2018 10:07    ASSESSMENT / PLAN:  Gastrointestinal Bleeding Hx: Alcoholic Cirrhosis and Upper GI Bleed  Trend CBC  Monitor for s/sx of bleeding and transfuse for hgb <7 Continue protonix and octreotide gtts  GI consulted appreciate input Continue ceftrioxone Trend WBC and monitor fever curve  Mildly elevated troponin likely demand ischemia secondary to GI bleed  Hx: HTN, PAD, CHF, and Hyperlipidemia Trend troponin's  Prn hydralazine   ESRD on HD Hyperkalemia  Trend BMP Replace electrolytes as indicated  Avoid nephrotoxic medications  Nephrology consulted appreciate input-HD per recommendations   Marda Stalker, [place] Pager [phone] (please enter 7 digits) PCCM Consult Pager [phone] (please enter 7 digits)

## 2018-04-26 NOTE — Progress Notes (Signed)
Post HD assessment    04/26/18 1851  Neurological  Level of Consciousness Alert  Orientation Level Oriented X4  Respiratory  Respiratory Pattern Regular;Dyspnea with exertion  Chest Assessment Chest expansion symmetrical  Cardiac  Pulse Regular  ECG Monitor Yes  Cardiac Rhythm NSR;ST  Antiarrhythmic device No  Vascular  R Radial Pulse +2  L Radial Pulse +2  Edema Generalized  Integumentary  Integumentary (WDL) X  Skin Color Appropriate for ethnicity  Musculoskeletal  Musculoskeletal (WDL) X  Generalized Weakness Yes  Assistive Device None  GU Assessment  Genitourinary (WDL) X  Genitourinary Symptoms  (HD)  Psychosocial  Psychosocial (WDL) WDL

## 2018-04-26 NOTE — Progress Notes (Signed)
[facility]  ROUNDING NOTE   Subjective:   [name] admitted to [facility] on 04/26/2018 for Generalized abdominal pain [R10.84] Gastrointestinal hemorrhage, unspecified gastrointestinal hemorrhage type [K92.2] Hypervolemia, unspecified hypervolemia type [E87.70]   Last hemodialysis was on 2/6 - has not had transportation to and from dialysis.   Continues to have esophageal pain, nausea and hemetemesis.   Objective:  Vital signs in last 24 hours:  Temp:  [97.8 F (36.6 C)-98.6 F (37 C)] 97.8 F (36.6 C) (02/13 1457) Pulse Rate:  [84-92] 88 (02/13 1530) Resp:  [16-34] 16 (02/13 1530) BP: (94-146)/(69-108) 125/108 (02/13 1530) SpO2:  [86 %-100 %] 99 % (02/13 1530) Weight:  [63.5 kg-81.3 kg] 81.3 kg (02/13 1305)  Weight change:  Filed Weights   04/26/18 0924 04/26/18 1305  Weight: 63.5 kg 81.3 kg    Intake/Output: No intake/output data recorded.   Intake/Output this shift:  Total I/O In: 251.9 [I.V.:193; IV Piggyback:58.9] Out: -   Physical Exam: General: NAD,   Head: Normocephalic, atraumatic. Moist oral mucosal membranes  Eyes: Anicteric, PERRL  Neck: Supple, trachea midline  Lungs:  Clear to auscultation  Heart: Regular rate and rhythm  Abdomen:  +distended  Extremities:  + peripheral edema.  Neurologic: Nonfocal, moving all four extremities  Skin: No lesions  Access: RIJ permcath    Basic Metabolic Panel: Recent Labs  Lab 04/26/18 0926  NA 140  K 6.0*  CL 83*  CO2 42*  GLUCOSE 116*  BUN 77*  CREATININE 13.09*  CALCIUM 9.6    Liver Function Tests: Recent Labs  Lab 04/26/18 0926  AST 26  ALT 5  ALKPHOS 82  BILITOT 0.9  PROT 7.2  ALBUMIN 2.7*   Recent Labs  Lab 04/26/18 0926  LIPASE 19   No results for input(s): AMMONIA in the last 168 hours.  CBC: Recent Labs  Lab 04/20/18 0425 04/26/18 0926  WBC 6.4 9.9  HGB 10.3* 9.2*  HCT 36.3* 31.8*  MCV 87.7 86.2  PLT 306 366    Cardiac Enzymes: Recent Labs   Lab 04/26/18 0926  TROPONINI 0.06*    BNP: Invalid input(s): POCBNP  CBG: No results for input(s): GLUCAP in the last 168 hours.  Microbiology: Results for orders placed or performed during the hospital encounter of 04/26/18  MRSA PCR Screening     Status: None   Collection Time: 04/26/18  1:09 PM  Result Value Ref Range Status   MRSA by PCR NEGATIVE NEGATIVE Final    Comment:        The GeneXpert MRSA Assay (FDA approved for NASAL specimens only), is one component of a comprehensive MRSA colonization surveillance program. It is not intended to diagnose MRSA infection nor to guide or monitor treatment for MRSA infections. Performed at [facility], [address] [place],  [address]     Coagulation Studies: No results for input(s): LABPROT, INR in the last 72 hours.  Urinalysis: No results for input(s): COLORURINE, LABSPEC, PHURINE, GLUCOSEU, HGBUR, BILIRUBINUR, KETONESUR, PROTEINUR, UROBILINOGEN, NITRITE, LEUKOCYTESUR in the last 72 hours.  Invalid input(s): APPERANCEUR    Imaging: Dg Chest Portable 1 View  Result Date: 04/26/2018 CLINICAL DATA:  Hematemesis.  Abdominal distension.  Ascites. EXAM: PORTABLE CHEST 1 VIEW COMPARISON:  04/09/2018 FINDINGS: Previous median sternotomy. Cardiomegaly. Aortic atherosclerosis. Right internal jugular central line tip in the SVC 3 cm above the right atrium. Pulmonary venous hypertension with mild interstitial edema. Mild atelectasis at the left lung base. No lobar collapse. No visible  effusion. IMPRESSION: Mild fluid overload/early interstitial edema. Mild atelectasis at the left base. Electronically Signed   By: Nelson Chimes M.D.   On: 04/26/2018 10:07     Medications:   . cefTRIAXone (ROCEPHIN)  IV 200 mL/hr at 04/26/18 1457  . octreotide  (SANDOSTATIN)    IV infusion 50 mcg/hr (04/26/18 1457)  . pantoprozole (PROTONIX) infusion 8 mg/hr (04/26/18 1457)   . [START ON 04/27/2018] Chlorhexidine Gluconate  Cloth  6 each Topical Q0600  . sodium chloride flush  3 mL Intravenous Q12H   acetaminophen **OR** acetaminophen, albuterol, heparin, hydrALAZINE, ondansetron **OR** ondansetron (ZOFRAN) IV, oxyCODONE, polyethylene glycol  Assessment/ Plan:  [name] is a 58 y.o. black male withend stage renal disease on hemodialysis secondary to Alport's syndrome, ascites, hypertension, anemia of chronic kidney disease, coronary artery disease, peripheral vascular disease, hyperlipidemia, gastrointestinal AVMs, pulmonary hypertension  [facility] TTS RIJ permcath.210 min  1. End-stage renal disease with hyperkalemia. Seen and examined on hemodialysis treatment. 2K bath. UF goal of 4 liters. Tolerating treatment well.   2. Anemia of chronic kidney disease with GI bleed. Hemoglobin 9.2. No indication for PRBC transfusion.  - EPO with HD treatment - Appreciate GI input: endoscopy for tomorrow.  - octreotide and pantoprazole drips.   3.  Ascites - schedule paracentesis  4. Hypertension: 146/89 - holding blood pressure medications.    LOS: 0 Stanley [name] 2/13/20203:38 PM

## 2018-04-26 NOTE — Consult Note (Signed)
Stanley Lame, MD [facility]  [address] [name], [name] [address] Phone: [phone] Fax : [phone]  Consultation  Referring Provider:     Dr. Darvin Neighbours Primary Care Physician:  Stanley Burrow, MD Primary Gastroenterologist:  Dr. Vira [name]         Reason for Consultation:     Hematemesis  Date of Admission:  04/26/2018 Date of Consultation:  04/26/2018         HPI:   [name] is a 58 y.o. male who is well known to this practice and has been seen many times with GI bleeding. The patient has a history of cirrhosis and has been found to have grade 1 esophageal varices.  The patient has also been scoped multiple times with the finding of esophagitis. The patient now reports that he has been having vomiting of dark material. At his last EGD the patient did not have any signs of bleeding but after the procedure he had a large amount of coughing up blood.  The patient was brought back to the endoscopy unit and the bleeding appeared to be from the oropharynx.  Past Medical History:  Diagnosis Date  . Alcohol abuse   . CHF (congestive heart failure) ([place])   . Cirrhosis ([place])   . Coronary artery disease 2009  . Drug abuse ([place])   . End stage renal disease on dialysis [facility]) NEPHROLOGIST-   DR [facility]  IN [place]   HEMODIALYSIS --   TUES/  THURS/  SAT  . Gastrointestinal bleed 06/13/2017   From chart...hx of multiple GI bleeds  . GERD (gastroesophageal reflux disease)   . Hyperlipidemia   . Hypertension   . PAD (peripheral artery disease) ([place])   . Renal insufficiency    Per pt, 32 oz fluid restriction per day  . S/P triple vessel bypass 06/09/2016   2009ish  . Suicidal ideation    & HOMICIDAL IDEATION --  06-16-2013   ADMITTED TO BEHAVIOR HEALTH    Past Surgical History:  Procedure Laterality Date  . A/V FISTULAGRAM Right 06/06/2017   Procedure: A/V FISTULAGRAM;  Surgeon: Katha Cabal, MD;  Location: [place] CV LAB;  Service: Cardiovascular;   Laterality: Right;  . A/V SHUNT INTERVENTION N/A 06/06/2017   Procedure: A/V SHUNT INTERVENTION;  Surgeon: Katha Cabal, MD;  Location: [place] CV LAB;  Service: Cardiovascular;  Laterality: N/A;  . AGILE CAPSULE N/A 06/19/2016   Procedure: AGILE CAPSULE;  Surgeon: [name], MD;  Location: [facility] ENDOSCOPY;  Service: Endoscopy;  Laterality: N/A;  . COLONOSCOPY WITH PROPOFOL N/A 06/18/2016   Procedure: COLONOSCOPY WITH PROPOFOL;  Surgeon: [name], MD;  Location: [facility] ENDOSCOPY;  Service: Endoscopy;  Laterality: N/A;  . COLONOSCOPY WITH PROPOFOL N/A 08/12/2016   Procedure: COLONOSCOPY WITH PROPOFOL;  Surgeon: [place] Lame, MD;  Location: [facility] ENDOSCOPY;  Service: Endoscopy;  Laterality: N/A;  . COLONOSCOPY WITH PROPOFOL N/A 05/05/2017   Procedure: COLONOSCOPY WITH PROPOFOL;  Surgeon: Manya Silvas, MD;  Location: [facility] ENDOSCOPY;  Service: Endoscopy;  Laterality: N/A;  . CORONARY ANGIOPLASTY  ?   PT UNABLE TO TELL IF  BEFORE OR AFTER  CABG  . CORONARY ARTERY BYPASS GRAFT  2008  ([place] , )   3 VESSEL  . DIALYSIS FISTULA CREATION  LAST SURGERY  APPOX  2008  . ENTEROSCOPY N/A 05/10/2016   Procedure: ENTEROSCOPY;  Surgeon: Jerene Bears, MD;  Location: [place];  Service: Gastroenterology;  Laterality: N/A;  . ENTEROSCOPY N/A 08/12/2016  Procedure: ENTEROSCOPY;  Surgeon: [place] Lame, MD;  Location: [facility] ENDOSCOPY;  Service: Endoscopy;  Laterality: N/A;  . ENTEROSCOPY Left 06/03/2017   Procedure: ENTEROSCOPY;  Surgeon: Virgel Manifold, MD;  Location: [facility] ENDOSCOPY;  Service: Endoscopy;  Laterality: Left;  Procedure date will ultimately depend on when patient is medically optimized before the procedure, pending hemodialysis and blood transfusions etc. Will place on schedule and change depending on clinical status.   . ENTEROSCOPY N/A 06/05/2017   Procedure: ENTEROSCOPY;  Surgeon: Virgel Manifold, MD;  Location: [facility] ENDOSCOPY;  Service: Endoscopy;  Laterality: N/A;  .  ENTEROSCOPY N/A 06/15/2017   Procedure: Push ENTEROSCOPY;  Surgeon: [place] Lame, MD;  Location: [facility] ENDOSCOPY;  Service: Endoscopy;  Laterality: N/A;  . ESOPHAGOGASTRODUODENOSCOPY N/A 05/07/2015   Procedure: ESOPHAGOGASTRODUODENOSCOPY (EGD);  Surgeon: Hulen Luster, MD;  Location: [facility] ENDOSCOPY;  Service: Endoscopy;  Laterality: N/A;  . ESOPHAGOGASTRODUODENOSCOPY (EGD) WITH PROPOFOL N/A 05/17/2015   Procedure: ESOPHAGOGASTRODUODENOSCOPY (EGD) WITH PROPOFOL;  Surgeon: [place] Lame, MD;  Location: [facility] ENDOSCOPY;  Service: Endoscopy;  Laterality: N/A;  . ESOPHAGOGASTRODUODENOSCOPY (EGD) WITH PROPOFOL N/A 01/20/2016   Procedure: ESOPHAGOGASTRODUODENOSCOPY (EGD) WITH PROPOFOL;  Surgeon: [name], MD;  Location: [facility] ENDOSCOPY;  Service: Endoscopy;  Laterality: N/A;  . ESOPHAGOGASTRODUODENOSCOPY (EGD) WITH PROPOFOL N/A 04/17/2016   Procedure: ESOPHAGOGASTRODUODENOSCOPY (EGD) WITH PROPOFOL;  Surgeon: Lin Landsman, MD;  Location: [facility] ENDOSCOPY;  Service: Gastroenterology;  Laterality: N/A;  . ESOPHAGOGASTRODUODENOSCOPY (EGD) WITH PROPOFOL  05/09/2016   Procedure: ESOPHAGOGASTRODUODENOSCOPY (EGD) WITH PROPOFOL;  Surgeon: Jerene Bears, MD;  Location: [facility];  Service: Endoscopy;;  . ESOPHAGOGASTRODUODENOSCOPY (EGD) WITH PROPOFOL N/A 06/16/2016   Procedure: ESOPHAGOGASTRODUODENOSCOPY (EGD) WITH PROPOFOL;  Surgeon: [place] Lame, MD;  Location: [facility] ENDOSCOPY;  Service: Endoscopy;  Laterality: N/A;  . ESOPHAGOGASTRODUODENOSCOPY (EGD) WITH PROPOFOL N/A 05/05/2017   Procedure: ESOPHAGOGASTRODUODENOSCOPY (EGD) WITH PROPOFOL;  Surgeon: Manya Silvas, MD;  Location: [facility] ENDOSCOPY;  Service: Endoscopy;  Laterality: N/A;  . ESOPHAGOGASTRODUODENOSCOPY (EGD) WITH PROPOFOL N/A 06/15/2017   Procedure: ESOPHAGOGASTRODUODENOSCOPY (EGD) WITH PROPOFOL;  Surgeon: [place] Lame, MD;  Location: [facility] ENDOSCOPY;  Service: Endoscopy;  Laterality: N/A;  . GIVENS CAPSULE STUDY N/A 05/07/2016   Procedure: GIVENS CAPSULE STUDY;   Surgeon: Doran Stabler, MD;  Location: [place];  Service: Endoscopy;  Laterality: N/A;  . LOWER EXTREMITY ANGIOGRAPHY Left 04/19/2018   Procedure: LOWER EXTREMITY ANGIOGRAPHY;  Surgeon: Algernon Huxley, MD;  Location: [place] CV LAB;  Service: Cardiovascular;  Laterality: Left;  Marland Kitchen MANDIBULAR HARDWARE REMOVAL N/A 07/29/2013   Procedure: REMOVAL OF ARCH BARS;  Surgeon: Theodoro Kos, DO;  Location: [place];  Service: Plastics;  Laterality: N/A;  . ORIF MANDIBULAR FRACTURE N/A 06/05/2013   Procedure: REPAIR OF MANDIBULAR FRACTURE x 2 with maxillo-mandibular fixation ;  Surgeon: Theodoro Kos, DO;  Location: [place];  Service: Plastics;  Laterality: N/A;  . PARACENTESIS    . PERIPHERAL ARTERIAL STENT GRAFT Left     Prior to Admission medications   Medication Sig Start Date End Date Taking? Authorizing Provider  aspirin EC 81 MG tablet Take 1 tablet (81 mg total) by mouth daily. 04/20/18 04/20/19  Epifanio Lesches, MD  budesonide-formoterol (SYMBICORT) 160-4.5 MCG/ACT inhaler Inhale 2 puffs into the lungs daily. 11/16/16   Vaughan Basta, MD  calcium acetate (PHOSLO) 667 MG capsule Take 2,001 mg by mouth 3 (three) times daily.    [provider]  clopidogrel (PLAVIX) 75 MG tablet Take 1 tablet (75 mg total) by mouth daily. 04/20/18 04/20/19  Epifanio Lesches, MD  folic acid (FOLVITE) 1 MG tablet Take 1 tablet (1 mg total) by mouth daily. 10/31/17   Fritzi Mandes, MD  gabapentin (NEURONTIN) 300 MG capsule Take 300 mg by mouth 3 (three) times daily.  08/16/16   [provider]  ipratropium-albuterol (DUONEB) 0.5-2.5 (3) MG/3ML SOLN Take 3 mLs by nebulization every 6 (six) hours as needed. 11/28/16   Loletha Grayer, MD  labetalol (NORMODYNE) 100 MG tablet Take 100 mg by mouth daily.    [provider]  multivitamin (RENA-VIT) TABS tablet Take 1 tablet by mouth at bedtime. 10/31/17   Fritzi Mandes, MD  omeprazole (PRILOSEC) 40 MG capsule Take 40 mg by  mouth daily. 09/08/17   [provider]  oxyCODONE (OXY IR/ROXICODONE) 5 MG immediate release tablet Take 1 tablet (5 mg total) by mouth every 4 (four) hours as needed for moderate pain. 04/17/18   Epifanio Lesches, MD  tiotropium (SPIRIVA HANDIHALER) 18 MCG inhalation capsule Place 1 capsule (18 mcg total) into inhaler and inhale daily. 01/30/18 04/30/18  Demetrios Loll, MD  vitamin C (VITAMIN C) 250 MG tablet Take 1 tablet (250 mg total) by mouth 2 (two) times daily. 04/20/18   Epifanio Lesches, MD    Family History  Problem Relation Age of Onset  . Colon cancer Mother   . Cancer Father   . Cancer Sister   . Kidney disease Brother      Social History   Tobacco Use  . Smoking status: Current Every Day Smoker    Packs/day: 0.15    Years: 40.00    Pack years: 6.00    Types: Cigarettes  . Smokeless tobacco: Never Used  Substance Use Topics  . Alcohol use: No    Comment: pt reports quitting after learning about cirrhosis  . Drug use: No    Frequency: 7.0 times per week    Types: Marijuana, Cocaine    Allergies as of 04/26/2018  . (No Known Allergies)    Review of Systems:    All systems reviewed and negative except where noted in HPI.   Physical Exam:  Vital signs in last 24 hours: Temp:  [97.8 F (36.6 C)-98.6 F (37 C)] 97.8 F (36.6 C) (02/13 1852) Pulse Rate:  [82-102] 101 (02/13 1852) Resp:  [16-34] 18 (02/13 1852) BP: (94-158)/(69-110) 111/85 (02/13 1852) SpO2:  [86 %-100 %] 93 % (02/13 1852) Weight:  [63.5 kg-82.8 kg] 79.9 kg (02/13 1852) Last BM Date: (UTA-pt does not remember) General:   Pleasant, cooperative in NAD Head:  Normocephalic and atraumatic. Eyes:   No icterus.   Conjunctiva pink. PERRLA. Ears:  Normal auditory acuity. Neck:  Supple; no masses or thyroidomegaly Lungs: Respirations even and unlabored. Lungs clear to auscultation bilaterally.   No wheezes, crackles, or rhonchi.  Heart:  Regular rate and rhythm;  Without murmur, clicks,  rubs or gallops Abdomen:  Soft, Distended with ascites, nontender. Normal bowel sounds. No appreciable masses or hepatomegaly.  No rebound or guarding.  Rectal:  Not performed. Msk:  Symmetrical without gross deformities.    Extremities:  Without edema, cyanosis or clubbing. Neurologic:  Alert and oriented x3;  grossly normal neurologically. Skin:  Intact without significant lesions or rashes. Cervical Nodes:  No significant cervical adenopathy. Psych:  Alert and cooperative. Normal affect.  LAB RESULTS: Recent Labs    04/26/18 0926  WBC 9.9  HGB 9.2*  HCT 31.8*  PLT 366   BMET Recent Labs    04/26/18 0926  NA 140  K 6.0*  CL 83*  CO2 42*  GLUCOSE 116*  BUN 77*  CREATININE 13.09*  CALCIUM 9.6   LFT Recent Labs    04/26/18 0926  PROT 7.2  ALBUMIN 2.7*  AST 26  ALT 5  ALKPHOS 82  BILITOT 0.9   PT/INR No results for input(s): LABPROT, INR in the last 72 hours.  STUDIES: Dg Chest Portable 1 View  Result Date: 04/26/2018 CLINICAL DATA:  Hematemesis.  Abdominal distension.  Ascites. EXAM: PORTABLE CHEST 1 VIEW COMPARISON:  04/09/2018 FINDINGS: Previous median sternotomy. Cardiomegaly. Aortic atherosclerosis. Right internal jugular central line tip in the SVC 3 cm above the right atrium. Pulmonary venous hypertension with mild interstitial edema. Mild atelectasis at the left lung base. No lobar collapse. No visible effusion. IMPRESSION: Mild fluid overload/early interstitial edema. Mild atelectasis at the left base. Electronically Signed   By: Nelson Chimes M.D.   On: 04/26/2018 10:07      Impression / Plan:   Assessment: Active Problems:   Upper GI bleed   Kaymon Denomme is a 58 y.o. y/o male with a history of cirrhosis and varices and recurrent GI bleeding.  The patient has had esophagitis found in the past and bleeding from his oropharynx.  The patient has been recommended multiple times to undergo double balloon enteroscopy at [facility]. It does not appear that  the patient has Followed up with that recommendation.  I have personally told him a multiple admissions that he needs to have that done at [facility].  Plan:  This patient came in with coffee-ground emesis and anemia. The patient will be set up for an EGD for tomorrow morning.  The patient could not be taken to the Endo unit today because he is going down for urgent hemodialysis.  The patient has been explained the plan and agrees with it.  Thank you for involving me in the care of this patient.      LOS: 0 days   [place] Lame, MD  04/26/2018, 7:49 PM    Note: This dictation was prepared with Dragon dictation along with smaller phrase technology. Any transcriptional errors that result from this process are unintentional.

## 2018-04-27 ENCOUNTER — Inpatient Hospital Stay: Payer: Medicare Other | Admitting: Anesthesiology

## 2018-04-27 ENCOUNTER — Inpatient Hospital Stay: Payer: Medicare Other

## 2018-04-27 ENCOUNTER — Encounter
Admission: EM | Discharge status: Against Medical Advice | Payer: Self-pay | Source: Home / Self Care | Attending: Internal Medicine

## 2018-04-27 ENCOUNTER — Encounter: Payer: Self-pay | Admitting: Gastroenterology

## 2018-04-27 DIAGNOSIS — K210 Gastro-esophageal reflux disease with esophagitis: Secondary | ICD-10-CM

## 2018-04-27 HISTORY — PX: ESOPHAGOGASTRODUODENOSCOPY (EGD) WITH PROPOFOL: SHX5813

## 2018-04-27 LAB — GLUCOSE, CAPILLARY
Glucose-Capillary: 66 mg/dL — ABNORMAL LOW (ref 70–99)
Glucose-Capillary: 102 mg/dL — ABNORMAL HIGH (ref 70–99)
Glucose-Capillary: 108 mg/dL — ABNORMAL HIGH (ref 70–99)

## 2018-04-27 LAB — BASIC METABOLIC PANEL
Anion gap: 14 (ref 5–15)
BUN: 46 mg/dL — ABNORMAL HIGH (ref 6–20)
CHLORIDE: 92 mmol/L — AB (ref 98–111)
CO2: 34 mmol/L — ABNORMAL HIGH (ref 22–32)
Calcium: 8.9 mg/dL (ref 8.9–10.3)
Creatinine, Ser: 8.32 mg/dL — ABNORMAL HIGH (ref 0.61–1.24)
GFR calc Af Amer: 7 mL/min — ABNORMAL LOW (ref >60)
GFR calc non Af Amer: 6 mL/min — ABNORMAL LOW (ref >60)
Glucose, Bld: 92 mg/dL (ref 70–99)
POTASSIUM: 5.4 mmol/L — AB (ref 3.5–5.1)
Sodium: 140 mmol/L (ref 135–145)

## 2018-04-27 LAB — CBC
HEMATOCRIT: 32.1 % — AB (ref 39.0–52.0)
Hemoglobin: 8.9 g/dL — ABNORMAL LOW (ref 13.0–17.0)
MCH: 25 pg — ABNORMAL LOW (ref 26.0–34.0)
MCHC: 27.7 g/dL — ABNORMAL LOW (ref 30.0–36.0)
MCV: 90.2 fL (ref 80.0–100.0)
Platelets: 336 10*3/uL (ref 150–400)
RBC: 3.56 MIL/uL — ABNORMAL LOW (ref 4.22–5.81)
RDW: 20.3 % — ABNORMAL HIGH (ref 11.5–15.5)
WBC: 16 10*3/uL — ABNORMAL HIGH (ref 4.0–10.5)
nRBC: 0 % (ref 0.0–0.2)

## 2018-04-27 LAB — HEMOGLOBIN AND HEMATOCRIT, BLOOD
HCT: 26.6 % — ABNORMAL LOW (ref 39.0–52.0)
HCT: 25.8 % — ABNORMAL LOW (ref 39.0–52.0)
HEMATOCRIT: 30 % — AB (ref 39.0–52.0)
HEMOGLOBIN: 8.7 g/dL — AB (ref 13.0–17.0)
Hemoglobin: 7.5 g/dL — ABNORMAL LOW (ref 13.0–17.0)
Hemoglobin: 7.2 g/dL — ABNORMAL LOW (ref 13.0–17.0)

## 2018-04-27 LAB — PROCALCITONIN: Procalcitonin: 3.04 ng/mL

## 2018-04-27 SURGERY — ESOPHAGOGASTRODUODENOSCOPY (EGD) WITH PROPOFOL
Anesthesia: General

## 2018-04-27 MED ORDER — SODIUM CHLORIDE 0.9 % IV SOLN
Freq: Two times a day (BID) | INTRAVENOUS | Status: DC
  Administered 2018-04-27 – 2018-04-28 (×3): via INTRAVENOUS
  Filled 2018-04-27 (×4): qty 1

## 2018-04-27 MED ORDER — IPRATROPIUM-ALBUTEROL 0.5-2.5 (3) MG/3ML IN SOLN: 3.0000 mL | RESPIRATORY_TRACT | Status: DC | PRN

## 2018-04-27 MED ORDER — SODIUM CHLORIDE 0.9 % IV SOLN
INTRAVENOUS | Status: DC
  Administered 2018-04-27: 10:00:00 via INTRAVENOUS

## 2018-04-27 MED ORDER — DEXTROSE 50 % IV SOLN
25.0000 mL | Freq: Once | INTRAVENOUS | Status: AC
  Administered 2018-04-27: 25 mL via INTRAVENOUS

## 2018-04-27 MED ORDER — LIDOCAINE HCL (CARDIAC) PF 100 MG/5ML IV SOSY
PREFILLED_SYRINGE | INTRAVENOUS | Status: DC | PRN
  Administered 2018-04-27: 80 mg via INTRAVENOUS

## 2018-04-27 MED ORDER — DEXTROSE 50 % IV SOLN
INTRAVENOUS | Status: AC
  Administered 2018-04-27: 25 mL
  Filled 2018-04-27: qty 50

## 2018-04-27 MED ORDER — PROPOFOL 10 MG/ML IV BOLUS
INTRAVENOUS | Status: AC
  Filled 2018-04-27: qty 40

## 2018-04-27 MED ORDER — SODIUM CHLORIDE 0.9 % IV SOLN
2.0000 g | INTRAVENOUS | Status: DC
  Administered 2018-04-27 – 2018-04-28 (×2): 2 g via INTRAVENOUS
  Filled 2018-04-27: qty 2
  Filled 2018-04-27 (×2): qty 20
  Filled 2018-04-27: qty 2

## 2018-04-27 MED ORDER — ALBUMIN HUMAN 25 % IV SOLN
25.0000 g | Freq: Once | INTRAVENOUS | Status: AC
  Administered 2018-04-27: 25 g via INTRAVENOUS
  Filled 2018-04-27: qty 100

## 2018-04-27 MED ORDER — LACTULOSE 10 GM/15ML PO SOLN
10.0000 g | Freq: Two times a day (BID) | ORAL | Status: DC
  Administered 2018-04-27 – 2018-04-29 (×4): 10 g via ORAL
  Filled 2018-04-27 (×5): qty 30

## 2018-04-27 MED ORDER — PROPOFOL 10 MG/ML IV BOLUS
INTRAVENOUS | Status: DC | PRN
  Administered 2018-04-27: 30 mg via INTRAVENOUS
  Administered 2018-04-27: 20 mg via INTRAVENOUS

## 2018-04-27 NOTE — Progress Notes (Signed)
[facility]  ROUNDING NOTE   Subjective:   Hemodialysis treatment yesterday. Tolerated treatment well. UF of 4 liters.   EGD this morning. Grade D esophagitis found.   Objective:  Vital signs in last 24 hours:  Temp:  [97.8 F (36.6 C)-102.8 F (39.3 C)] 100 F (37.8 C) (02/14 1140) Pulse Rate:  [80-105] 85 (02/14 1300) Resp:  [15-34] 17 (02/14 1300) BP: (87-158)/(41-110) 103/58 (02/14 1300) SpO2:  [88 %-100 %] 92 % (02/14 1300) FiO2 (%):  [40 %-45 %] 40 % (02/14 0753) Weight:  [76.5 kg-82.8 kg] 76.5 kg (02/14 0517)  Weight change:  Filed Weights   04/26/18 1457 04/26/18 1852 04/27/18 0517  Weight: 82.8 kg 79.9 kg 76.5 kg    Intake/Output: I/O last 3 completed shifts: In: 564.3 [I.V.:445.4; IV Piggyback:118.9] Out: 1638 [Other:4008]   Intake/Output this shift:  Total I/O In: 199.3 [I.V.:199.3] Out: 0   Physical Exam: General: NAD,   Head: Normocephalic, atraumatic. Moist oral mucosal membranes  Eyes: Anicteric, PERRL  Neck: Supple, trachea midline  Lungs:  Clear to auscultation  Heart: Regular rate and rhythm  Abdomen:  +distended  Extremities:  no peripheral edema.  Neurologic: Nonfocal, moving all four extremities  Skin: No lesions  Access: RIJ permcath    Basic Metabolic Panel: Recent Labs  Lab 04/26/18 0926 04/26/18 1948 04/27/18 0125  NA 140 139 140  K 6.0* 5.3* 5.4*  CL 83* 92* 92*  CO2 42* 34* 34*  GLUCOSE 116* 103* 92  BUN 77* 43* 46*  CREATININE 13.09* 8.02* 8.32*  CALCIUM 9.6 9.0 8.9    Liver Function Tests: Recent Labs  Lab 04/26/18 0926 04/26/18 1948  AST 26 22  ALT 5 <5  ALKPHOS 82 80  BILITOT 0.9 0.9  PROT 7.2 7.3  ALBUMIN 2.7* 2.8*   Recent Labs  Lab 04/26/18 0926  LIPASE 19   No results for input(s): AMMONIA in the last 168 hours.  CBC: Recent Labs  Lab 04/26/18 0926 04/26/18 2028 04/27/18 0125 04/27/18 0747 04/27/18 1336  WBC 9.9  --  16.0*  --   --   HGB 9.2* 9.7* 8.9* 8.7* 7.5*  HCT 31.8*  34.4* 32.1* 30.0* 26.6*  MCV 86.2  --  90.2  --   --   PLT 366  --  336  --   --     Cardiac Enzymes: Recent Labs  Lab 04/26/18 0926  TROPONINI 0.06*    BNP: Invalid input(s): POCBNP  CBG: Recent Labs  Lab 04/26/18 1303 04/26/18 1951 04/27/18 0832 04/27/18 0934 04/27/18 1029  GLUCAP 113* 83 59* 102* 108*    Microbiology: Results for orders placed or performed during the hospital encounter of 04/26/18  MRSA PCR Screening     Status: None   Collection Time: 04/26/18  1:09 PM  Result Value Ref Range Status   MRSA by PCR NEGATIVE NEGATIVE Final    Comment:        The GeneXpert MRSA Assay (FDA approved for NASAL specimens only), is one component of a comprehensive MRSA colonization surveillance program. It is not intended to diagnose MRSA infection nor to guide or monitor treatment for MRSA infections. Performed at [facility], [address] [place], [place] [address]     Coagulation Studies: No results for input(s): LABPROT, INR in the last 72 hours.  Urinalysis: No results for input(s): COLORURINE, LABSPEC, PHURINE, GLUCOSEU, HGBUR, BILIRUBINUR, KETONESUR, PROTEINUR, UROBILINOGEN, NITRITE, LEUKOCYTESUR in the last 72 hours.  Invalid input(s): APPERANCEUR  Imaging: Ct Head Wo Contrast  Result Date: 04/26/2018 CLINICAL DATA:  Altered mental status EXAM: CT HEAD WITHOUT CONTRAST TECHNIQUE: Contiguous axial images were obtained from the base of the skull through the vertex without intravenous contrast. COMPARISON:  01/15/2018 FINDINGS: Brain: Minimal atrophic changes are noted. Scattered lacunar infarcts are noted stable from the previous exam. No findings to suggest acute hemorrhage, acute infarction or space-occupying mass lesion are seen. Vascular: No hyperdense vessel or unexpected calcification. Skull: Normal. Negative for fracture or focal lesion. Sinuses/Orbits: No acute finding. Other: None. IMPRESSION: Chronic changes without acute  abnormality Electronically Signed   By: Inez Catalina M.D.   On: 04/26/2018 21:04   Dg Chest Port 1 View  Result Date: 04/26/2018 CLINICAL DATA:  Hypoxia EXAM: PORTABLE CHEST 1 VIEW COMPARISON:  04/26/2018 FINDINGS: Right dialysis catheter remains in place, unchanged. Prior CABG. Cardiomegaly with vascular congestion and interstitial prominence, likely interstitial edema. Bibasilar atelectasis. No effusions or acute bony abnormality. IMPRESSION: Cardiomegaly with mild interstitial edema.  Bibasilar atelectasis. Electronically Signed   By: Rolm Baptise M.D.   On: 04/26/2018 21:06   Dg Chest Portable 1 View  Result Date: 04/26/2018 CLINICAL DATA:  Hematemesis.  Abdominal distension.  Ascites. EXAM: PORTABLE CHEST 1 VIEW COMPARISON:  04/09/2018 FINDINGS: Previous median sternotomy. Cardiomegaly. Aortic atherosclerosis. Right internal jugular central line tip in the SVC 3 cm above the right atrium. Pulmonary venous hypertension with mild interstitial edema. Mild atelectasis at the left lung base. No lobar collapse. No visible effusion. IMPRESSION: Mild fluid overload/early interstitial edema. Mild atelectasis at the left base. Electronically Signed   By: [place] Chimes M.D.   On: 04/26/2018 10:07     Medications:   . cefTRIAXone (ROCEPHIN)  IV    . pantoprozole (PROTONIX) infusion 8 mg/hr (04/27/18 0740)   . sodium chloride   Intravenous Once  . Chlorhexidine Gluconate Cloth  6 each Topical Q0600  . [START ON 04/28/2018] epoetin (EPOGEN/PROCRIT) injection  10,000 Units Intravenous Q T,Th,Sa-HD  . lactulose  10 g Oral BID  . mouth rinse  15 mL Mouth Rinse BID  . sodium chloride flush  3 mL Intravenous Q12H   acetaminophen **OR** acetaminophen, hydrALAZINE, ipratropium-albuterol, ondansetron **OR** ondansetron (ZOFRAN) IV, oxyCODONE, polyethylene glycol, promethazine  Assessment/ Plan:  Mr. Friend Dorfman is a 58 y.o. black male withend stage renal disease on hemodialysis secondary to [place]'s  syndrome, ascites, hypertension, anemia of chronic kidney disease, coronary artery disease, peripheral vascular disease, hyperlipidemia, gastrointestinal AVMs, pulmonary hypertension  [facility] TTS RIJ permcath.210 min  1. End-stage renal disease with hyperkalemia. 2K bath. Tolerated hemodialysis treatment well. UF of 4 liters.  - Next treatment for Saturday.  - Outpatient dialysis transportation limitations. Patient has no transportation to dialysis. Last outpatient dialysis was 1/23. He has been getting dialysis through the hospital.   2. Anemia of chronic kidney disease with GI bleed. Hemoglobin 7.5 - EPO with HD treatment - Appreciate GI input: endoscopy with Grade D esophagitis.  - pantoprazole gtt  3.  Ascites - schedule paracentesis - ceftriaxone for prophylaxis for SBP  4. Hypertension:   - holding blood pressure medications.    LOS: 1 Amiliana Foutz 2/14/20202:02 PM

## 2018-04-27 NOTE — Plan of Care (Signed)
Discussed doing multidisciplinary rounds.  Discussed with Dr. Juleen China.  Patient had hemodialysis yesterday and did well with this treatment had 4 L ultrafiltered.  Today he went for EGD and grade D esophagitis was noted.  He is currently on Protonix infusion.  He appears to be resolving we will continue to be available while he is in [place] Unit.

## 2018-04-27 NOTE — Progress Notes (Signed)
Pt went off of unit for endoscopy.

## 2018-04-27 NOTE — Anesthesia Preprocedure Evaluation (Signed)
Anesthesia Evaluation  Patient identified by MRN, date of birth, ID band Patient awake    Reviewed: Allergy & Precautions, NPO status , Patient's Chart, lab work & pertinent test results  History of Anesthesia Complications Negative for: history of anesthetic complications  Airway Mallampati: III  TM Distance: >3 FB Neck ROM: Full    Dental  (+) Poor Dentition, Missing   Pulmonary neg sleep apnea, COPD,  COPD inhaler, Current Smoker,    breath sounds clear to auscultation- rhonchi (-) wheezing      Cardiovascular hypertension, Pt. on medications + CAD, + CABG (2009), + Peripheral Vascular Disease and +CHF  (-) Cardiac Stents  Rhythm:Regular Rate:Normal - Systolic murmurs and - Diastolic murmurs Echo 3/41/96: - Left ventricle: The cavity size was mildly dilated. There was   mild concentric hypertrophy. Systolic function was normal. The   estimated ejection fraction was in the range of 50% to 55%. Wall   motion was normal; there were no regional wall motion   abnormalities. The study is not technically sufficient to allow   evaluation of LV diastolic function. - Mitral valve: Calcified annulus. Mildly thickened leaflets .   There was moderate to severe regurgitation directed posteriorly. - Left atrium: The atrium was moderately to severely dilated. - Right atrium: The atrium was mildly dilated. - Tricuspid valve: There was moderate regurgitation. - Pulmonary arteries: Systolic pressure was severely increased. PA   peak pressure: 60 mm Hg (S). - Inferior vena cava: The vessel was dilated. The respirophasic   diameter changes were blunted (< 50%), consistent with elevated   central venous pressure.   Neuro/Psych negative neurological ROS  negative psych ROS   GI/Hepatic Neg liver ROS, PUD, GERD  ,  Endo/Other  diabetes  Renal/GU ESRF and DialysisRenal disease     Musculoskeletal negative musculoskeletal ROS (+)   Abdominal (+) - obese,   Peds  Hematology  (+) anemia ,   Anesthesia Other Findings Past Medical History: No date: Alcohol abuse No date: CHF (congestive heart failure) (HCC) No date: Cirrhosis ([place]) 2009: Coronary artery disease No date: Diabetic peripheral neuropathy (HCC) No date: Drug abuse ([place]) NEPHROLOGIST-   DR [facility]  [place]: End stage renal disease on  dialysis ([place])     Comment:  HEMODIALYSIS --   TUES/  THURS/  SAT No date: GERD (gastroesophageal reflux disease) No date: Hyperlipidemia No date: Hypertension No date: PAD (peripheral artery disease) (HCC) No date: Renal insufficiency     Comment:  Per pt, 32 oz fluid restriction per day 06/09/2016: S/P triple vessel bypass     Comment:  2009ish No date: Suicidal ideation     Comment:  & HOMICIDAL IDEATION --  06-16-2013   ADMITTED TO               BEHAVIOR HEALTH   Reproductive/Obstetrics                             Anesthesia Physical Anesthesia Plan  ASA: IV  Anesthesia Plan: General   Post-op Pain Management:    Induction: Intravenous  PONV Risk Score and Plan: 0 and Propofol infusion  Airway Management Planned: Natural Airway  Additional Equipment:   Intra-op Plan:   Post-operative Plan:   Informed Consent: I have reviewed the patients History and Physical, chart, labs and discussed the procedure including the risks, benefits and alternatives for the proposed anesthesia with the patient or authorized representative who has indicated his/her  understanding and acceptance.     Dental advisory given  Plan Discussed with: CRNA and Anesthesiologist  Anesthesia Plan Comments: (Possible intubation if intervention required)        Anesthesia Quick Evaluation

## 2018-04-27 NOTE — Anesthesia Post-op Follow-up Note (Signed)
Anesthesia QCDR form completed.        

## 2018-04-27 NOTE — Transfer of Care (Signed)
Immediate Anesthesia Transfer of Care Note  Patient: Stanley [name]  Procedure(s) Performed: ESOPHAGOGASTRODUODENOSCOPY (EGD) WITH PROPOFOL (N/A )  Patient Location: PACU and Endoscopy Unit  Anesthesia Type:General  Level of Consciousness: drowsy, patient cooperative and lethargic  Airway & Oxygen Therapy: Patient Spontanous Breathing and Patient connected to face mask oxygen  Post-op Assessment: Report given to RN, Post -op Vital signs reviewed and stable and Patient moving all extremities  Post vital signs: Reviewed and stable  Last Vitals:  Vitals Value Taken Time  BP 94/57 04/27/2018 10:24 AM  Temp 36.2 C 04/27/2018 10:24 AM  Pulse 82 04/27/2018 10:30 AM  Resp 16 04/27/2018 10:30 AM  SpO2 97 % 04/27/2018 10:30 AM  Vitals shown include unvalidated device data.  Last Pain:  Vitals:   04/27/18 0552  TempSrc: Axillary  PainSc:          Complications: No apparent anesthesia complications

## 2018-04-27 NOTE — Progress Notes (Signed)
   04/27/18 1500  Clinical Encounter Type  Visited With Patient  Visit Type Initial  Referral From Chaplain  Consult/Referral To Chaplain  Chaplain was rounding and stop to visit patient. Chaplain introduce herself to patient and patient said Chaplain, how you doing. Chaplain said I am great, checking on you today to see how you are doing. Patient said am ok I guess. Patient was eating broth,Chaplain help patient by placing a towel on his chest and pushing table a little closer.Patient said it would be better if it had noodles and salt. Chaplain laugh and said yes I am sure it would be. Chaplain asked was there anything that she can do for him and patient said no, you have done enough. Chaplain said ok and gave her blessings.

## 2018-04-27 NOTE — Progress Notes (Signed)
Patient returned from procedure at this time.

## 2018-04-27 NOTE — Progress Notes (Signed)
Patient leaving unit at this time to go for a paracentesis.

## 2018-04-27 NOTE — OR Nursing (Signed)
Per request, Dr. Randa Lynn, anesth, spoke with pts sister about anesthesia plans for egd procedure. Pt. Lethargic.

## 2018-04-27 NOTE — Op Note (Signed)
[facility] Patient Name: Stanley [name] Procedure Date: 04/27/2018 7:41 AM MRN: [id number] Account #: [id number] Date of Birth: 1960/04/21 Admit Type: Inpatient Age: 58 Room: [facility] ENDO ROOM 1 Gender: Male Note Status: Finalized Procedure:            Upper GI endoscopy Indications:          Hematemesis Providers:            [name] MD, MD Referring MD:         Elyse Jarvis Revelo (Referring MD) Medicines:            Propofol per Anesthesia Complications:        No immediate complications. Procedure:            Pre-Anesthesia Assessment:                       - Prior to the procedure, a History and Physical was                        performed, and patient medications and allergies were                        reviewed. The patient's tolerance of previous                        anesthesia was also reviewed. The risks and benefits of                        the procedure and the sedation options and risks were                        discussed with the patient. All questions were                        answered, and informed consent was obtained. Prior                        Anticoagulants: The patient has taken Plavix                        (clopidogrel). ASA Grade Assessment: III - A patient                        with severe systemic disease. After reviewing the risks                        and benefits, the patient was deemed in satisfactory                        condition to undergo the procedure.                       After obtaining informed consent, the endoscope was                        passed under direct vision. Throughout the procedure,                        the patient's blood pressure, pulse, and oxygen  saturations were monitored continuously. The Endoscope                        was introduced through the mouth, and advanced to the                        second part of duodenum. The upper GI endoscopy  was                        accomplished without difficulty. The patient tolerated                        the procedure well. Findings:      LA Grade D (one or more mucosal breaks involving at least 75% of       esophageal circumference) esophagitis with bleeding was found in the       lower third of the esophagus.      The stomach was normal.      The examined duodenum was normal. Impression:           - LA Grade D reflux esophagitis.                       - Normal stomach.                       - Normal examined duodenum.                       - No specimens collected. Recommendation:       - Return patient to hospital ward for ongoing care.                       - Resume previous diet.                       - Continue present medications.                       - Use a proton pump inhibitor IV BID. Procedure [name](s):    --- Professional ---                       [id number], Esophagogastroduodenoscopy, flexible, transoral;                        diagnostic, including collection of specimen(s) by                        brushing or washing, when performed (separate procedure) Diagnosis [name](s):    --- Professional ---                       K92.0, Hematemesis                       K21.0, Gastro-esophageal reflux disease with esophagitis CPT copyright 2018 American Medical Association. All rights reserved. The codes documented in this report are preliminary and upon coder review may  be revised to meet current compliance requirements. [name] MD, MD 04/27/2018 10:18:46 AM This report has been signed electronically. Number of Addenda: 0 Note Initiated On: 04/27/2018 7:41 AM      [facility]

## 2018-04-27 NOTE — Anesthesia Postprocedure Evaluation (Signed)
Anesthesia Post Note  Patient: Stanley [name]  Procedure(s) Performed: ESOPHAGOGASTRODUODENOSCOPY (EGD) WITH PROPOFOL (N/A )  Patient location during evaluation: Endoscopy Anesthesia Type: General Level of consciousness: awake and alert and oriented Pain management: pain level controlled Vital Signs Assessment: post-procedure vital signs reviewed and stable Respiratory status: spontaneous breathing, nonlabored ventilation and respiratory function stable Cardiovascular status: blood pressure returned to baseline and stable Postop Assessment: no signs of nausea or vomiting Anesthetic complications: no     Last Vitals:  Vitals:   04/27/18 1140 04/27/18 1141  BP:    Pulse: 83 81  Resp: 17 17  Temp: 37.8 C   SpO2: 96% 96%    Last Pain:  Vitals:   04/27/18 1140  TempSrc: Axillary  PainSc:                  Johniya Durfee

## 2018-04-27 NOTE — Progress Notes (Signed)
[place] at [place] NAME: Costa Jha    MR#:  [id number]  DATE OF BIRTH:  1960/07/03  SUBJECTIVE:  CHIEF COMPLAINT:   Chief Complaint  Patient presents with  . Hematemesis  . Abdominal Pain   The patient is confused, on oxygen by nasal cannula 6 L. REVIEW OF SYSTEMS:  Review of Systems  Unable to perform ROS: Mental status change    DRUG ALLERGIES:  No Known Allergies VITALS:  Blood pressure 102/64, pulse 80, temperature 100 F (37.8 C), temperature source Axillary, resp. rate (!) 21, height 6\' 3"  (1.905 m), weight 76.5 kg, SpO2 98 %. PHYSICAL EXAMINATION:  Physical Exam Constitutional:      General: He is not in acute distress. HENT:     Head: Normocephalic.     Mouth/Throat:     Mouth: Mucous membranes are moist.  Eyes:     General: No scleral icterus.    Conjunctiva/sclera: Conjunctivae normal.     Pupils: Pupils are equal, round, and reactive to light.  Neck:     Musculoskeletal: Neck supple.     Vascular: No JVD.     Trachea: No tracheal deviation.  Cardiovascular:     Rate and Rhythm: Normal rate and regular rhythm.     Heart sounds: Normal heart sounds. No murmur. No gallop.   Pulmonary:     Effort: Pulmonary effort is normal. No respiratory distress.     Breath sounds: Normal breath sounds. No wheezing or rales.  Abdominal:     General: Bowel sounds are normal. There is no distension.     Palpations: Abdomen is soft.     Tenderness: There is no abdominal tenderness. There is no rebound.  Musculoskeletal:        General: No tenderness.  Skin:    Findings: No erythema or rash.  Neurological:     Cranial Nerves: No cranial nerve deficit.     Comments: Unable to exam.  Psychiatric:     Comments: Drowsy and confused.    LABORATORY PANEL:  Male CBC Recent Labs  Lab 04/27/18 0125  04/27/18 1336  WBC 16.0*  --   --   HGB 8.9*   < > 7.5*  HCT 32.1*   < > 26.6*  PLT 336  --   --    < > = values  in this interval not displayed.   ------------------------------------------------------------------------------------------------------------------ Chemistries  Recent Labs  Lab 04/26/18 1948 04/27/18 0125  NA 139 140  K 5.3* 5.4*  CL 92* 92*  CO2 34* 34*  GLUCOSE 103* 92  BUN 43* 46*  CREATININE 8.02* 8.32*  CALCIUM 9.0 8.9  AST 22  --   ALT <5  --   ALKPHOS 80  --   BILITOT 0.9  --    RADIOLOGY:  Ct Head Wo Contrast  Result Date: 04/26/2018 CLINICAL DATA:  Altered mental status EXAM: CT HEAD WITHOUT CONTRAST TECHNIQUE: Contiguous axial images were obtained from the base of the skull through the vertex without intravenous contrast. COMPARISON:  01/15/2018 FINDINGS: Brain: Minimal atrophic changes are noted. Scattered lacunar infarcts are noted stable from the previous exam. No findings to suggest acute hemorrhage, acute infarction or space-occupying mass lesion are seen. Vascular: No hyperdense vessel or unexpected calcification. Skull: Normal. Negative for fracture or focal lesion. Sinuses/Orbits: No acute finding. Other: None. IMPRESSION: Chronic changes without acute abnormality Electronically Signed   By: Inez Catalina M.D.   On: 04/26/2018 21:04  Dg Chest Port 1 View  Result Date: 04/26/2018 CLINICAL DATA:  Hypoxia EXAM: PORTABLE CHEST 1 VIEW COMPARISON:  04/26/2018 FINDINGS: Right dialysis catheter remains in place, unchanged. Prior CABG. Cardiomegaly with vascular congestion and interstitial prominence, likely interstitial edema. Bibasilar atelectasis. No effusions or acute bony abnormality. IMPRESSION: Cardiomegaly with mild interstitial edema.  Bibasilar atelectasis. Electronically Signed   By: Rolm Baptise M.D.   On: 04/26/2018 21:06   ASSESSMENT AND PLAN:   *Upper GI bleed with coffee-ground emesis and blood in rectal vault/melena. Patient with history of varices with cirrhosis.  Continue protonix drip. EGD: LA Grade D reflux esophagitis. Started clear liquid diet  after EGD per Dr. Allen Norris.  Associated acute blood loss anemia.   Hemoglobin decreased to 7.5.  Follow-up hemoglobin, PRBC transfusion PRN. EPO with HD treatment per Dr. Juleen China.  *[name].  Missed his hemodialysis and hyperkalemia.  Follow-up with Dr. Juleen China for hemodialysis.  *Chronic ascites with recurrent paracentesis.  Will need to consult IR once stable for paracentesis.  *Hypertension.   Continue home hypertension medications.  Added IV hydralazine PRN.  *Chronic lower extremity pain with recent ischemia and intervention.   Hold antiplatelet therapy due to GI bleed.  *CAD.  Stable.  *Tobacco abuse.  Counseled to quit smoking.  Acute respiratory failure with hypoxia, possible due to pulmonary edema and aspiration. Continue oxygen by nasal cannula, DuoNeb as needed. Continue Zosyn IV.  Acute metabolic encephalopathy.  Possible due to respiratory failure and GI bleeding. Aspiration precaution.  All the records are reviewed and case discussed with Care Management/Social Worker. Management plans discussed with the patient, family and they are in agreement.  CODE STATUS: Full Code  TOTAL TIME TAKING CARE OF THIS PATIENT: 36 minutes.   More than 50% of the time was spent in counseling/coordination of care: YES  POSSIBLE D/C IN 2 DAYS, DEPENDING ON CLINICAL CONDITION.   Demetrios Loll M.D on 04/27/2018 at 3:17 PM  Between 7am to 6pm - Pager - [phone]  After 6pm go to [email or web address] - [facility]

## 2018-04-27 NOTE — Progress Notes (Signed)
Patient refused paracentesis at this time.  Patient stated he will get it done after he eats.

## 2018-04-27 NOTE — Procedures (Signed)
PROCEDURE SUMMARY:  Successful US guided paracentesis from right lateral abdomen.  Yielded 7.3 liters of dark yellow fluid.  No immediate complications.  Pt tolerated well.   Specimen was not sent for labs.  EBL < 52mL  Docia Barrier PA-C 04/27/2018 4:08 PM

## 2018-04-28 LAB — COMPREHENSIVE METABOLIC PANEL
ALK PHOS: 64 U/L (ref 38–126)
ALT: <5 U/L (ref 0–44)
AST: 17 U/L (ref 15–41)
Albumin: 2.3 g/dL — ABNORMAL LOW (ref 3.5–5.0)
Anion gap: 10 (ref 5–15)
BILIRUBIN TOTAL: 0.7 mg/dL (ref 0.3–1.2)
BUN: 66 mg/dL — ABNORMAL HIGH (ref 6–20)
CO2: 34 mmol/L — ABNORMAL HIGH (ref 22–32)
CREATININE: 9.98 mg/dL — AB (ref 0.61–1.24)
Calcium: 8.1 mg/dL — ABNORMAL LOW (ref 8.9–10.3)
Chloride: 96 mmol/L — ABNORMAL LOW (ref 98–111)
GFR calc Af Amer: 6 mL/min — ABNORMAL LOW (ref >60)
GFR calc non Af Amer: 5 mL/min — ABNORMAL LOW (ref >60)
Glucose, Bld: 142 mg/dL — ABNORMAL HIGH (ref 70–99)
Potassium: 5.5 mmol/L — ABNORMAL HIGH (ref 3.5–5.1)
Sodium: 140 mmol/L (ref 135–145)
TOTAL PROTEIN: 5.9 g/dL — AB (ref 6.5–8.1)

## 2018-04-28 LAB — RENAL FUNCTION PANEL
Albumin: 2.3 g/dL — ABNORMAL LOW (ref 3.5–5.0)
Anion gap: 13 (ref 5–15)
BUN: 72 mg/dL — AB (ref 6–20)
CO2: 31 mmol/L (ref 22–32)
Calcium: 7.7 mg/dL — ABNORMAL LOW (ref 8.9–10.3)
Chloride: 93 mmol/L — ABNORMAL LOW (ref 98–111)
Creatinine, Ser: 10.33 mg/dL — ABNORMAL HIGH (ref 0.61–1.24)
GFR calc Af Amer: 6 mL/min — ABNORMAL LOW (ref >60)
GFR calc non Af Amer: 5 mL/min — ABNORMAL LOW (ref >60)
Glucose, Bld: 102 mg/dL — ABNORMAL HIGH (ref 70–99)
POTASSIUM: 5.5 mmol/L — AB (ref 3.5–5.1)
Phosphorus: 5.6 mg/dL — ABNORMAL HIGH (ref 2.5–4.6)
Sodium: 137 mmol/L (ref 135–145)

## 2018-04-28 LAB — CBC
HCT: 29.9 % — ABNORMAL LOW (ref 39.0–52.0)
HCT: 27.6 % — ABNORMAL LOW (ref 39.0–52.0)
Hemoglobin: 8.2 g/dL — ABNORMAL LOW (ref 13.0–17.0)
Hemoglobin: 7.6 g/dL — ABNORMAL LOW (ref 13.0–17.0)
MCH: 24.8 pg — ABNORMAL LOW (ref 26.0–34.0)
MCH: 24.4 pg — ABNORMAL LOW (ref 26.0–34.0)
MCHC: 27.4 g/dL — ABNORMAL LOW (ref 30.0–36.0)
MCHC: 27.5 g/dL — ABNORMAL LOW (ref 30.0–36.0)
MCV: 90.6 fL (ref 80.0–100.0)
MCV: 88.7 fL (ref 80.0–100.0)
PLATELETS: 313 10*3/uL (ref 150–400)
Platelets: 315 10*3/uL (ref 150–400)
RBC: 3.3 MIL/uL — ABNORMAL LOW (ref 4.22–5.81)
RBC: 3.11 MIL/uL — ABNORMAL LOW (ref 4.22–5.81)
RDW: 19.9 % — AB (ref 11.5–15.5)
RDW: 19.8 % — ABNORMAL HIGH (ref 11.5–15.5)
WBC: 11.2 10*3/uL — ABNORMAL HIGH (ref 4.0–10.5)
WBC: 12.2 10*3/uL — ABNORMAL HIGH (ref 4.0–10.5)
nRBC: 0 % (ref 0.0–0.2)
nRBC: 0 % (ref 0.0–0.2)

## 2018-04-28 LAB — PROCALCITONIN: Procalcitonin: 4.47 ng/mL

## 2018-04-28 LAB — PHOSPHORUS: Phosphorus: 5.3 mg/dL — ABNORMAL HIGH (ref 2.5–4.6)

## 2018-04-28 LAB — HEMOGLOBIN AND HEMATOCRIT, BLOOD
HCT: 28.1 % — ABNORMAL LOW (ref 39.0–52.0)
Hemoglobin: 7.9 g/dL — ABNORMAL LOW (ref 13.0–17.0)

## 2018-04-28 LAB — MAGNESIUM: Magnesium: 2.3 mg/dL (ref 1.7–2.4)

## 2018-04-28 MED ORDER — PANTOPRAZOLE SODIUM 40 MG PO TBEC
40.0000 mg | DELAYED_RELEASE_TABLET | Freq: Two times a day (BID) | ORAL | Status: DC
  Administered 2018-04-29: 40 mg via ORAL
  Filled 2018-04-28: qty 1

## 2018-04-28 NOTE — Progress Notes (Signed)
[place] at [place] NAME: Stanley [name]    MR#:  [id number]  DATE OF BIRTH:  April 16, 1960  SUBJECTIVE:  CHIEF COMPLAINT:   Chief Complaint  Patient presents with  . Hematemesis  . Abdominal Pain   The patient is alert and awake, off oxygen.  He got a paracentesis with 7.3 L fluid withdrawal. REVIEW OF SYSTEMS:  Review of Systems  Constitutional: Negative for chills, fever and malaise/fatigue.  HENT: Negative for sore throat.   Eyes: Negative for blurred vision and double vision.  Respiratory: Positive for cough and shortness of breath. Negative for hemoptysis, wheezing and stridor.   Cardiovascular: Negative for chest pain, palpitations, orthopnea and leg swelling.  Gastrointestinal: Negative for abdominal pain, blood in stool, diarrhea, melena, nausea and vomiting.  Genitourinary: Negative for dysuria, flank pain and hematuria.  Musculoskeletal: Negative for back pain and joint pain.  Neurological: Negative for dizziness, sensory change, focal weakness, seizures, loss of consciousness, weakness and headaches.  Endo/Heme/Allergies: Negative for polydipsia.  Psychiatric/Behavioral: Negative for depression. The patient is not nervous/anxious.     DRUG ALLERGIES:  No Known Allergies VITALS:  Blood pressure (!) 99/55, pulse 81, temperature 99.2 F (37.3 C), temperature source Oral, resp. rate 14, height 6\' 3"  (1.905 m), weight 70.1 kg, SpO2 93 %. PHYSICAL EXAMINATION:  Physical Exam Constitutional:      General: He is not in acute distress.    Appearance: Normal appearance.  HENT:     Head: Normocephalic.     Mouth/Throat:     Mouth: Mucous membranes are moist.  Eyes:     General: No scleral icterus.    Conjunctiva/sclera: Conjunctivae normal.     Pupils: Pupils are equal, round, and reactive to light.  Neck:     Musculoskeletal: Neck supple.     Vascular: No JVD.     Trachea: No tracheal deviation.  Cardiovascular:   Rate and Rhythm: Normal rate and regular rhythm.     Heart sounds: Normal heart sounds. No murmur. No gallop.   Pulmonary:     Effort: Pulmonary effort is normal. No respiratory distress.     Breath sounds: Rales present. No wheezing.  Abdominal:     General: Bowel sounds are normal. There is distension.     Palpations: Abdomen is soft.     Tenderness: There is no abdominal tenderness. There is no rebound.  Musculoskeletal:        General: No tenderness.     Right lower leg: No edema.     Left lower leg: No edema.  Skin:    Findings: No erythema or rash.  Neurological:     General: No focal deficit present.     Mental Status: He is oriented to person, place, and time.     Cranial Nerves: No cranial nerve deficit.  Psychiatric:        Mood and Affect: Mood normal.    LABORATORY PANEL:  Male CBC Recent Labs  Lab 04/28/18 0602  WBC 11.2*  HGB 8.2*  HCT 29.9*  PLT 313   ------------------------------------------------------------------------------------------------------------------ Chemistries  Recent Labs  Lab 04/28/18 0602  NA 140  K 5.5*  CL 96*  CO2 34*  GLUCOSE 142*  BUN 66*  CREATININE 9.98*  CALCIUM 8.1*  MG 2.3  AST 17  ALT <5  ALKPHOS 64  BILITOT 0.7   RADIOLOGY:  US Paracentesis  Result Date: 04/27/2018 INDICATION: Patient with abdominal distention, ascites. Request is made for therapeutic paracentesis.  EXAM: ULTRASOUND GUIDED THERAPEUTIC PARACENTESIS MEDICATIONS: 10 mL 1% lidocaine COMPLICATIONS: None immediate. PROCEDURE: Informed written consent was obtained from the patient after a discussion of the risks, benefits and alternatives to treatment. A timeout was performed prior to the initiation of the procedure. Initial ultrasound scanning demonstrates a large amount of ascites within the right lower abdominal quadrant. The right lower abdomen was prepped and draped in the usual sterile fashion. 1% lidocaine was used for local anesthesia. Following  this, a Safe-T-Centesis catheter was introduced. An ultrasound image was saved for documentation purposes. The paracentesis was performed. The catheter was removed and a dressing was applied. The patient tolerated the procedure well without immediate post procedural complication. FINDINGS: A total of approximately 7.3 liters of dark, yellow fluid was removed. IMPRESSION: Successful ultrasound-guided therapeutic paracentesis yielding 7.3 liters of peritoneal fluid. Read by: Brynda Greathouse PA-C Electronically Signed   By: [name] M.D.   On: 04/27/2018 16:40   ASSESSMENT AND PLAN:   *Upper GI bleed with coffee-ground emesis and blood in rectal vault/melena. Patient with history of varices with cirrhosis.  Change Protonix drip to p.o. twice daily. EGD: LA Grade D reflux esophagitis. Renal diet.  Associated acute blood loss anemia.   Hemoglobin decreased to 7.5.  But up to 8.2, no active bleeding, follow-up hemoglobin, PRBC transfusion PRN. EPO with HD treatment per Dr. Juleen China.  *[name].  Missed his hemodialysis Follow-up with Dr. Juleen China for hemodialysis.  Hyperkalemia.  Potassium is still high at 5.5.  Hemodialysis today.  *Chronic ascites with recurrent paracentesis.   S/p paracentesis with 7.3 L fluid withdrawal.  *Hypertension.   Continue home hypertension medications.  Added IV hydralazine PRN. Hold hypertension medication if blood pressure is low.  *Chronic lower extremity pain with recent ischemia and intervention.   Hold antiplatelet therapy due to GI bleed.  *CAD.  Stable.  *Tobacco abuse.  Counseled to quit smoking.  Acute respiratory failure with hypoxia, possible due to pulmonary edema. Off O2 [place],  DuoNeb as needed.  Acute metabolic encephalopathy.  Possible due to respiratory failure and GI bleeding. Improved.  I discussed with Dr. Patsey Berthold. All the records are reviewed and case discussed with Care Management/Social  Worker. Management plans discussed with the patient, family and they are in agreement.  CODE STATUS: Full Code  TOTAL TIME TAKING CARE OF THIS PATIENT: 28 minutes.   More than 50% of the time was spent in counseling/coordination of care: YES  POSSIBLE D/C IN 2 DAYS, DEPENDING ON CLINICAL CONDITION.   Demetrios Loll M.D on 04/28/2018 at 12:50 PM  Between 7am to 6pm - Pager - [phone]  After 6pm go to [email or web address] - [employer] [facility]

## 2018-04-28 NOTE — Progress Notes (Signed)
[facility]  ROUNDING NOTE   Subjective:   Tmax 100.1  Large volume paracentesis yesterday, 7.3 liters removed.   Pantoprazole gtt  Objective:  Vital signs in last 24 hours:  Temp:  [99.1 F (37.3 C)-100 F (37.8 C)] 99.2 F (37.3 C) (02/15 0800) Pulse Rate:  [74-85] 81 (02/15 1000) Resp:  [11-21] 14 (02/15 1000) BP: (81-121)/(41-73) 99/55 (02/15 1000) SpO2:  [88 %-100 %] 93 % (02/15 1000) FiO2 (%):  [36 %] 36 % (02/14 1952) Weight:  [70.1 kg] 70.1 kg (02/15 0303)  Weight change: 6.596 kg Filed Weights   04/26/18 1852 04/27/18 0517 04/28/18 0303  Weight: 79.9 kg 76.5 kg 70.1 kg    Intake/Output: I/O last 3 completed shifts: In: 940.9 [I.V.:790.9; IV Piggyback:150] Out: 0    Intake/Output this shift:  Total I/O In: 120 [P.O.:120] Out: -   Physical Exam: General: NAD,   Head: Normocephalic, atraumatic. Moist oral mucosal membranes  Eyes: Anicteric, PERRL  Neck: Supple, trachea midline  Lungs:  Clear to auscultation  Heart: Regular rate and rhythm  Abdomen:  +distended  Extremities:  no peripheral edema.  Neurologic: Nonfocal, moving all four extremities  Skin: No lesions  Access: RIJ permcath    Basic Metabolic Panel: Recent Labs  Lab 04/26/18 0926 04/26/18 1948 04/27/18 0125 04/28/18 0602  NA 140 139 140 140  K 6.0* 5.3* 5.4* 5.5*  CL 83* 92* 92* 96*  CO2 42* 34* 34* 34*  GLUCOSE 116* 103* 92 142*  BUN 77* 43* 46* 66*  CREATININE 13.09* 8.02* 8.32* 9.98*  CALCIUM 9.6 9.0 8.9 8.1*  MG  --   --   --  2.3  PHOS  --   --   --  5.3*    Liver Function Tests: Recent Labs  Lab 04/26/18 0926 04/26/18 1948 04/28/18 0602  AST 26 22 17   ALT 5 <5 <5  ALKPHOS 82 80 64  BILITOT 0.9 0.9 0.7  PROT 7.2 7.3 5.9*  ALBUMIN 2.7* 2.8* 2.3*   Recent Labs  Lab 04/26/18 0926  LIPASE 19   No results for input(s): AMMONIA in the last 168 hours.  CBC: Recent Labs  Lab 04/26/18 0926  04/27/18 0125 04/27/18 0747 04/27/18 1336  04/27/18 1901 04/28/18 0047 04/28/18 0602  WBC 9.9  --  16.0*  --   --   --   --  11.2*  HGB 9.2*   < > 8.9* 8.7* 7.5* 7.2* 7.9* 8.2*  HCT 31.8*   < > 32.1* 30.0* 26.6* 25.8* 28.1* 29.9*  MCV 86.2  --  90.2  --   --   --   --  90.6  PLT 366  --  336  --   --   --   --  313   < > = values in this interval not displayed.    Cardiac Enzymes: Recent Labs  Lab 04/26/18 0926  TROPONINI 0.06*    BNP: Invalid input(s): POCBNP  CBG: Recent Labs  Lab 04/26/18 1303 04/26/18 1951 04/27/18 0832 04/27/18 0934 04/27/18 1029  GLUCAP 113* 83 32* 102* 108*    Microbiology: Results for orders placed or performed during the hospital encounter of 04/26/18  MRSA PCR Screening     Status: None   Collection Time: 04/26/18  1:09 PM  Result Value Ref Range Status   MRSA by PCR NEGATIVE NEGATIVE Final    Comment:        The GeneXpert MRSA Assay (FDA approved for NASAL specimens only), is  one component of a comprehensive MRSA colonization surveillance program. It is not intended to diagnose MRSA infection nor to guide or monitor treatment for MRSA infections. Performed at [facility], [address] [place], [place] [address]     Coagulation Studies: No results for input(s): LABPROT, INR in the last 72 hours.  Urinalysis: No results for input(s): COLORURINE, LABSPEC, PHURINE, GLUCOSEU, HGBUR, BILIRUBINUR, KETONESUR, PROTEINUR, UROBILINOGEN, NITRITE, LEUKOCYTESUR in the last 72 hours.  Invalid input(s): APPERANCEUR    Imaging: Ct Head Wo Contrast  Result Date: 04/26/2018 CLINICAL DATA:  Altered mental status EXAM: CT HEAD WITHOUT CONTRAST TECHNIQUE: Contiguous axial images were obtained from the base of the skull through the vertex without intravenous contrast. COMPARISON:  01/15/2018 FINDINGS: Brain: Minimal atrophic changes are noted. Scattered lacunar infarcts are noted stable from the previous exam. No findings to suggest acute hemorrhage, acute infarction or  space-occupying mass lesion are seen. Vascular: No hyperdense vessel or unexpected calcification. Skull: Normal. Negative for fracture or focal lesion. Sinuses/Orbits: No acute finding. Other: None. IMPRESSION: Chronic changes without acute abnormality Electronically Signed   By: Inez Catalina M.D.   On: 04/26/2018 21:04   US Paracentesis  Result Date: 04/27/2018 INDICATION: Patient with abdominal distention, ascites. Request is made for therapeutic paracentesis. EXAM: ULTRASOUND GUIDED THERAPEUTIC PARACENTESIS MEDICATIONS: 10 mL 1% lidocaine COMPLICATIONS: None immediate. PROCEDURE: Informed written consent was obtained from the patient after a discussion of the risks, benefits and alternatives to treatment. A timeout was performed prior to the initiation of the procedure. Initial ultrasound scanning demonstrates a large amount of ascites within the right lower abdominal quadrant. The right lower abdomen was prepped and draped in the usual sterile fashion. 1% lidocaine was used for local anesthesia. Following this, a Safe-T-Centesis catheter was introduced. An ultrasound image was saved for documentation purposes. The paracentesis was performed. The catheter was removed and a dressing was applied. The patient tolerated the procedure well without immediate post procedural complication. FINDINGS: A total of approximately 7.3 liters of dark, yellow fluid was removed. IMPRESSION: Successful ultrasound-guided therapeutic paracentesis yielding 7.3 liters of peritoneal fluid. Read by: Brynda Greathouse PA-C Electronically Signed   By: [name] M.D.   On: 04/27/2018 16:40   Dg Chest Port 1 View  Result Date: 04/26/2018 CLINICAL DATA:  Hypoxia EXAM: PORTABLE CHEST 1 VIEW COMPARISON:  04/26/2018 FINDINGS: Right dialysis catheter remains in place, unchanged. Prior CABG. Cardiomegaly with vascular congestion and interstitial prominence, likely interstitial edema. Bibasilar atelectasis. No effusions or acute bony  abnormality. IMPRESSION: Cardiomegaly with mild interstitial edema.  Bibasilar atelectasis. Electronically Signed   By: Rolm Baptise M.D.   On: 04/26/2018 21:06     Medications:   . small volume/piggyback builder 100 mL/hr at 04/28/18 1051  . cefTRIAXone (ROCEPHIN)  IV Stopped (04/27/18 1824)  . pantoprozole (PROTONIX) infusion 8 mg/hr (04/28/18 0700)   . sodium chloride   Intravenous Once  . Chlorhexidine Gluconate Cloth  6 each Topical Q0600  . epoetin (EPOGEN/PROCRIT) injection  10,000 Units Intravenous Q T,Th,Sa-HD  . lactulose  10 g Oral BID  . mouth rinse  15 mL Mouth Rinse BID  . sodium chloride flush  3 mL Intravenous Q12H   acetaminophen **OR** acetaminophen, hydrALAZINE, ipratropium-albuterol, ondansetron **OR** ondansetron (ZOFRAN) IV, oxyCODONE, polyethylene glycol, promethazine  Assessment/ Plan:  [name] is a 58 y.o. black male withend stage renal disease on hemodialysis secondary to Alport's syndrome, ascites, hypertension, anemia of chronic kidney disease, coronary artery disease, peripheral vascular disease, hyperlipidemia,  gastrointestinal AVMs, pulmonary hypertension admitted to [facility] on 04/26/2018 for GI bleed. Endoscopy on 2/14 showing esophagitis. Placed on pantoprazole gtt.   [facility] TTS RIJ permcath.210 min  1. End-stage renal disease with hyperkalemia. 1K bath. Last hemodialysis was on Thursday, 2/13.  - Dialysis for later today. Orders prepared.  - Outpatient dialysis transportation limitations. Patient has no transportation to dialysis. Last outpatient dialysis was 1/23. He has been getting dialysis through the hospital.   2. Anemia of chronic kidney disease with GI bleed. Hemoglobin 8.2 - EPO with HD treatment - Appreciate GI input: endoscopy with Grade D esophagitis.  - pantoprazole gtt  3.  Ascites: status post paracentesis on 2/14 yielding 7.3 liters.  - ceftriaxone for prophylaxis for SBP  4. Hypertension: hypotensive  this morning.  - holding blood pressure medications.   5. Secondary Hyperparathyroidism: phosphorus and calcium at goal. Not currently on binders.    LOS: 2 Stanley [name] 2/15/202011:13 AM

## 2018-04-29 MED ORDER — MOMETASONE FURO-FORMOTEROL FUM 200-5 MCG/ACT IN AERO
2.0000 | INHALATION_SPRAY | Freq: Two times a day (BID) | RESPIRATORY_TRACT | Status: DC
  Administered 2018-04-29: 2 via RESPIRATORY_TRACT
  Filled 2018-04-29: qty 8.8

## 2018-04-29 MED ORDER — RENA-VITE PO TABS
1.0000 | ORAL_TABLET | Freq: Every day | ORAL | Status: DC
  Filled 2018-04-29: qty 1

## 2018-04-29 MED ORDER — CALCIUM ACETATE (PHOS BINDER) 667 MG PO CAPS
2001.0000 mg | ORAL_CAPSULE | Freq: Three times a day (TID) | ORAL | Status: DC
  Filled 2018-04-29 (×5): qty 3

## 2018-04-29 MED ORDER — GABAPENTIN 300 MG PO CAPS: 300.0000 mg | ORAL_CAPSULE | Freq: Three times a day (TID) | ORAL | Status: DC

## 2018-04-29 MED ORDER — TIOTROPIUM BROMIDE MONOHYDRATE 18 MCG IN CAPS
18.0000 ug | ORAL_CAPSULE | Freq: Every day | RESPIRATORY_TRACT | Status: DC
  Administered 2018-04-29: 18 ug via RESPIRATORY_TRACT
  Filled 2018-04-29: qty 5

## 2018-04-29 MED ORDER — VITAMIN C 500 MG PO TABS
250.0000 mg | ORAL_TABLET | Freq: Two times a day (BID) | ORAL | Status: DC
  Administered 2018-04-29: 250 mg via ORAL
  Filled 2018-04-29: qty 1

## 2018-04-29 MED ORDER — FOLIC ACID 1 MG PO TABS
1.0000 mg | ORAL_TABLET | Freq: Every day | ORAL | Status: DC
  Administered 2018-04-29: 1 mg via ORAL
  Filled 2018-04-29: qty 1

## 2018-04-29 NOTE — Progress Notes (Signed)
Patient impatient wanting to go home. He said family is waiting for him at the emergency room and he was leaving without waiting to see physician. Patient signed AMA papers.

## 2018-04-29 NOTE — Discharge Summary (Signed)
[facility] [name] NAME: Stanley [name]    MR#:  [id number]  DATE OF BIRTH:  1960/06/30  DATE OF ADMISSION:  04/26/2018   ADMITTING PHYSICIAN: Hillary Bow, MD  DATE OF DISCHARGE: 04/29/2018 10:17 AM  PRIMARY CARE PHYSICIAN: [name], MD   ADMISSION DIAGNOSIS:  Generalized abdominal pain [R10.84] Gastrointestinal hemorrhage, unspecified gastrointestinal hemorrhage type [K92.2] Hypervolemia, unspecified hypervolemia type [E87.70] DISCHARGE DIAGNOSIS:  Active Problems:   Upper GI bleed  SECONDARY DIAGNOSIS:   Past Medical History:  Diagnosis Date  . Alcohol abuse   . CHF (congestive heart failure) ([place])   . Cirrhosis ([place])   . Coronary artery disease 2009  . Drug abuse ([place])   . End stage renal disease on dialysis [facility]) NEPHROLOGIST-   DR [facility]  IN [place]   HEMODIALYSIS --   TUES/  THURS/  SAT  . Gastrointestinal bleed 06/13/2017   From chart...hx of multiple GI bleeds  . GERD (gastroesophageal reflux disease)   . Hyperlipidemia   . Hypertension   . PAD (peripheral artery disease) ([place])   . Renal insufficiency    Per pt, 32 oz fluid restriction per day  . S/P triple vessel bypass 06/09/2016   2009ish  . Suicidal ideation    & HOMICIDAL IDEATION --  06-16-2013   ADMITTED TO [facility] COURSE:  Stanley [name]  is a 58 y.o. male with a known history of end-stage renal disease on hemodialysis, hypertension, cirrhosis, gastric varices, noncompliance, multiple EGDs and colonoscopies presents to the emergency room complaining of coffee-ground emesis for 2 days.   *Upper GI bleed with coffee-ground emesis and blood in rectal vault/melena. Patient with history of varices with cirrhosis.  Changed Protonix drip to p.o. twice daily. EGD: LA Grade D reflux esophagitis. Renal diet.  Associated acute blood loss anemia.  Hemoglobin decreased to 7.6, no active bleeding, follow-up hemoglobin, PRBC  transfusion PRN. EPO with HD treatment per Dr. Juleen China.  *[name]. Missed his hemodialysis. He got hemodialysis in the hospital.  Hyperkalemia.  Potassium is still high at 5.5.  He needs more dialysis.  *Chronic ascites with recurrent paracentesis.  S/p paracentesis with 7.3 L fluid withdrawal.  *Hypertension.  Continue home hypertension medications. Added IV hydralazine PRN. Hold hypertension medication if blood pressure is low.  *Chronic lower extremity pain with recent ischemia and intervention.  Hold antiplatelet therapy due to GI bleed.  *CAD. Stable.  *Tobacco abuse. Counseled to quit smoking.  Acute respiratory failure with hypoxia, possible due to pulmonary edema. Off O2 [place],  DuoNeb as needed.  Acute metabolic encephalopathy.  Possible due to respiratory failure and GI bleeding. Improved. The patient left AMA. DISCHARGE CONDITIONS:  The patient left AMA. CONSULTS OBTAINED:  Treatment Team:  [name], MD DRUG ALLERGIES:  No Known Allergies DISCHARGE MEDICATIONS:   Allergies as of 04/29/2018   No Known Allergies     Medication List    TAKE these medications   ascorbic acid 250 MG tablet Commonly known as:  VITAMIN C Take 1 tablet (250 mg total) by mouth 2 (two) times daily.   aspirin EC 81 MG tablet Take 1 tablet (81 mg total) by mouth daily.   budesonide-formoterol 160-4.5 MCG/ACT inhaler Commonly known as:  SYMBICORT Inhale 2 puffs into the lungs daily.   calcium acetate 667 MG capsule Commonly known as:  PHOSLO Take 2,001 mg by mouth 3 (three) times daily.   clopidogrel 75 MG  tablet Commonly known as:  PLAVIX Take 1 tablet (75 mg total) by mouth daily.   folic acid 1 MG tablet Commonly known as:  FOLVITE Take 1 tablet (1 mg total) by mouth daily.   gabapentin 300 MG capsule Commonly known as:  NEURONTIN Take 300 mg by mouth 3 (three) times daily.   ipratropium-albuterol 0.5-2.5 (3) MG/3ML  Soln Commonly known as:  DUONEB Take 3 mLs by nebulization every 6 (six) hours as needed.   labetalol 100 MG tablet Commonly known as:  NORMODYNE Take 100 mg by mouth daily.   multivitamin Tabs tablet Take 1 tablet by mouth at bedtime.   omeprazole 40 MG capsule Commonly known as:  PRILOSEC Take 40 mg by mouth daily.   oxyCODONE 5 MG immediate release tablet Commonly known as:  Oxy IR/ROXICODONE Take 1 tablet (5 mg total) by mouth every 4 (four) hours as needed for moderate pain.   tiotropium 18 MCG inhalation capsule Commonly known as:  SPIRIVA HANDIHALER Place 1 capsule (18 mcg total) into inhaler and inhale daily.        If you experience worsening of your admission symptoms, develop shortness of breath, life threatening emergency, suicidal or homicidal thoughts you must seek medical attention immediately by calling 911 or calling your MD immediately  if symptoms less severe.  You Must read complete instructions/literature along with all the possible adverse reactions/side effects for all the Medicines you take and that have been prescribed to you. Take any new Medicines after you have completely understood and accpet all the possible adverse reactions/side effects.   Please note  You were cared for by a hospitalist during your hospital stay. If you have any questions about your discharge medications or the care you received while you were in the hospital after you are discharged, you can call the unit and asked to speak with the hospitalist on call if the hospitalist that took care of you is not available. Once you are discharged, your primary care physician will handle any further medical issues. Please note that NO REFILLS for any discharge medications will be authorized once you are discharged, as it is imperative that you return to your primary care physician (or establish a relationship with a primary care physician if you do not have one) for your aftercare needs so that  they can reassess your need for medications and monitor your lab values.    On the day of Discharge:  VITAL SIGNS:  Blood pressure 119/60, pulse 78, temperature 98.1 F (36.7 C), temperature source Oral, resp. rate 18, height 6\' 3"  (1.905 m), weight 72.7 kg, SpO2 91 %.  CBC Recent Labs  Lab 04/28/18 1850  WBC 12.2*  HGB 7.6*  HCT 27.6*  PLT 315    Chemistries  Recent Labs  Lab 04/28/18 0602 04/28/18 1850  NA 140 137  K 5.5* 5.5*  CL 96* 93*  CO2 34* 31  GLUCOSE 142* 102*  BUN 66* 72*  CREATININE 9.98* 10.33*  CALCIUM 8.1* 7.7*  MG 2.3  --   AST 17  --   ALT <5  --   ALKPHOS 64  --   BILITOT 0.7  --      Microbiology Results  Results for orders placed or performed during the hospital encounter of 04/26/18  MRSA PCR Screening     Status: None   Collection Time: 04/26/18  1:09 PM  Result Value Ref Range Status   MRSA by PCR NEGATIVE NEGATIVE Final    Comment:  The GeneXpert MRSA Assay (FDA approved for NASAL specimens only), is one component of a comprehensive MRSA colonization surveillance program. It is not intended to diagnose MRSA infection nor to guide or monitor treatment for MRSA infections. Performed at [facility], [address] [place], [place] [address]     RADIOLOGY:  No results found.   Management plans discussed with the patient, family and they are in agreement.  CODE STATUS: Prior    Demetrios Loll M.D on 04/29/2018 at 2:39 PM  Between 7am to 6pm - Pager - [phone]  After 6pm go to [email or web address] - Proofreader  [facility]  Office  [phone]  CC: Primary care physician; [name], MD   Note: This dictation was prepared with Dragon dictation along with smaller phrase technology. Any transcriptional errors that result from this process are unintentional.

## 2018-05-03 ENCOUNTER — Emergency Department: Payer: Medicare Other

## 2018-05-03 ENCOUNTER — Inpatient Hospital Stay
Admission: EM | Admit: 2018-05-03 | Discharge: 2018-05-07 | DRG: 640 | Disposition: A | Discharge status: Home / Self Care | Payer: Medicare Other | Attending: Internal Medicine | Admitting: Internal Medicine

## 2018-05-03 ENCOUNTER — Encounter: Payer: Self-pay | Admitting: Emergency Medicine

## 2018-05-03 DIAGNOSIS — K219 Gastro-esophageal reflux disease without esophagitis: Secondary | ICD-10-CM | POA: Diagnosis present

## 2018-05-03 DIAGNOSIS — K7460 Unspecified cirrhosis of liver: Secondary | ICD-10-CM | POA: Diagnosis present

## 2018-05-03 DIAGNOSIS — I132 Hypertensive heart and chronic kidney disease with heart failure and with stage 5 chronic kidney disease, or end stage renal disease: Secondary | ICD-10-CM | POA: Diagnosis present

## 2018-05-03 DIAGNOSIS — I739 Peripheral vascular disease, unspecified: Secondary | ICD-10-CM | POA: Diagnosis present

## 2018-05-03 DIAGNOSIS — E785 Hyperlipidemia, unspecified: Secondary | ICD-10-CM | POA: Diagnosis present

## 2018-05-03 DIAGNOSIS — K921 Melena: Secondary | ICD-10-CM

## 2018-05-03 DIAGNOSIS — R1111 Vomiting without nausea: Secondary | ICD-10-CM | POA: Diagnosis not present

## 2018-05-03 DIAGNOSIS — Z7982 Long term (current) use of aspirin: Secondary | ICD-10-CM

## 2018-05-03 DIAGNOSIS — Z7951 Long term (current) use of inhaled steroids: Secondary | ICD-10-CM

## 2018-05-03 DIAGNOSIS — R0602 Shortness of breath: Secondary | ICD-10-CM | POA: Diagnosis not present

## 2018-05-03 DIAGNOSIS — R778 Other specified abnormalities of plasma proteins: Secondary | ICD-10-CM

## 2018-05-03 DIAGNOSIS — Z79899 Other long term (current) drug therapy: Secondary | ICD-10-CM

## 2018-05-03 DIAGNOSIS — D631 Anemia in chronic kidney disease: Secondary | ICD-10-CM | POA: Diagnosis present

## 2018-05-03 DIAGNOSIS — Z992 Dependence on renal dialysis: Secondary | ICD-10-CM

## 2018-05-03 DIAGNOSIS — F17210 Nicotine dependence, cigarettes, uncomplicated: Secondary | ICD-10-CM | POA: Diagnosis present

## 2018-05-03 DIAGNOSIS — I2510 Atherosclerotic heart disease of native coronary artery without angina pectoris: Secondary | ICD-10-CM | POA: Diagnosis present

## 2018-05-03 DIAGNOSIS — R7989 Other specified abnormal findings of blood chemistry: Secondary | ICD-10-CM

## 2018-05-03 DIAGNOSIS — I272 Pulmonary hypertension, unspecified: Secondary | ICD-10-CM | POA: Diagnosis present

## 2018-05-03 DIAGNOSIS — N186 End stage renal disease: Secondary | ICD-10-CM | POA: Diagnosis present

## 2018-05-03 DIAGNOSIS — K922 Gastrointestinal hemorrhage, unspecified: Secondary | ICD-10-CM | POA: Diagnosis not present

## 2018-05-03 DIAGNOSIS — N2581 Secondary hyperparathyroidism of renal origin: Secondary | ICD-10-CM | POA: Diagnosis not present

## 2018-05-03 DIAGNOSIS — J449 Chronic obstructive pulmonary disease, unspecified: Secondary | ICD-10-CM | POA: Diagnosis present

## 2018-05-03 DIAGNOSIS — R188 Other ascites: Secondary | ICD-10-CM | POA: Diagnosis present

## 2018-05-03 DIAGNOSIS — E875 Hyperkalemia: Secondary | ICD-10-CM | POA: Diagnosis not present

## 2018-05-03 DIAGNOSIS — Z9115 Patient's noncompliance with renal dialysis: Secondary | ICD-10-CM

## 2018-05-03 DIAGNOSIS — Z951 Presence of aortocoronary bypass graft: Secondary | ICD-10-CM

## 2018-05-03 DIAGNOSIS — R079 Chest pain, unspecified: Secondary | ICD-10-CM

## 2018-05-03 DIAGNOSIS — Z7902 Long term (current) use of antithrombotics/antiplatelets: Secondary | ICD-10-CM

## 2018-05-03 DIAGNOSIS — K2211 Ulcer of esophagus with bleeding: Secondary | ICD-10-CM | POA: Diagnosis present

## 2018-05-03 DIAGNOSIS — I5032 Chronic diastolic (congestive) heart failure: Secondary | ICD-10-CM | POA: Diagnosis not present

## 2018-05-03 DIAGNOSIS — D62 Acute posthemorrhagic anemia: Secondary | ICD-10-CM | POA: Diagnosis not present

## 2018-05-03 LAB — CBC
HCT: 23.2 % — ABNORMAL LOW (ref 39.0–52.0)
Hemoglobin: 6.8 g/dL — ABNORMAL LOW (ref 13.0–17.0)
MCH: 24.6 pg — ABNORMAL LOW (ref 26.0–34.0)
MCHC: 29.3 g/dL — ABNORMAL LOW (ref 30.0–36.0)
MCV: 84.1 fL (ref 80.0–100.0)
Platelets: 447 10*3/uL — ABNORMAL HIGH (ref 150–400)
RBC: 2.76 MIL/uL — ABNORMAL LOW (ref 4.22–5.81)
RDW: 19.7 % — ABNORMAL HIGH (ref 11.5–15.5)
WBC: 11.7 10*3/uL — AB (ref 4.0–10.5)
nRBC: 0 % (ref 0.0–0.2)

## 2018-05-03 LAB — COMPREHENSIVE METABOLIC PANEL
ALT: 6 U/L (ref 0–44)
AST: 19 U/L (ref 15–41)
Albumin: 2.3 g/dL — ABNORMAL LOW (ref 3.5–5.0)
Alkaline Phosphatase: 100 U/L (ref 38–126)
Anion gap: 17 — ABNORMAL HIGH (ref 5–15)
BUN: 93 mg/dL — ABNORMAL HIGH (ref 6–20)
CO2: 29 mmol/L (ref 22–32)
Calcium: 7.9 mg/dL — ABNORMAL LOW (ref 8.9–10.3)
Chloride: 92 mmol/L — ABNORMAL LOW (ref 98–111)
Creatinine, Ser: 12.04 mg/dL — ABNORMAL HIGH (ref 0.61–1.24)
GFR calc non Af Amer: 4 mL/min — ABNORMAL LOW (ref >60)
GFR, EST AFRICAN AMERICAN: 5 mL/min — AB (ref >60)
Glucose, Bld: 113 mg/dL — ABNORMAL HIGH (ref 70–99)
Potassium: 7.3 mmol/L (ref 3.5–5.1)
Sodium: 138 mmol/L (ref 135–145)
Total Bilirubin: 0.5 mg/dL (ref 0.3–1.2)
Total Protein: 6.8 g/dL (ref 6.5–8.1)

## 2018-05-03 LAB — APTT: aPTT: 42 seconds — ABNORMAL HIGH (ref 24–36)

## 2018-05-03 LAB — GLUCOSE, CAPILLARY: Glucose-Capillary: 99 mg/dL (ref 70–99)

## 2018-05-03 LAB — PROTIME-INR
INR: 1.16
Prothrombin Time: 14.7 seconds (ref 11.4–15.2)

## 2018-05-03 LAB — TROPONIN I: Troponin I: 0.04 ng/mL (ref <0.03)

## 2018-05-03 LAB — PREPARE RBC (CROSSMATCH)

## 2018-05-03 LAB — BRAIN NATRIURETIC PEPTIDE: B Natriuretic Peptide: 1809 pg/mL — ABNORMAL HIGH (ref 0.0–100.0)

## 2018-05-03 MED ORDER — ALBUTEROL SULFATE (2.5 MG/3ML) 0.083% IN NEBU
2.5000 mg | INHALATION_SOLUTION | Freq: Once | RESPIRATORY_TRACT | Status: AC
  Administered 2018-05-03: 2.5 mg via RESPIRATORY_TRACT
  Filled 2018-05-03: qty 3

## 2018-05-03 MED ORDER — SODIUM CHLORIDE 0.9 % IV SOLN: 1.0000 g | Freq: Once | INTRAVENOUS | Status: DC

## 2018-05-03 MED ORDER — SODIUM CHLORIDE 0.9 % IV SOLN
80.0000 mg | Freq: Once | INTRAVENOUS | Status: AC
  Administered 2018-05-03: 80 mg via INTRAVENOUS
  Filled 2018-05-03: qty 80

## 2018-05-03 MED ORDER — CALCIUM GLUCONATE-NACL 1-0.675 GM/50ML-% IV SOLN
1.0000 g | Freq: Once | INTRAVENOUS | Status: AC
  Administered 2018-05-03: 1000 mg via INTRAVENOUS
  Filled 2018-05-03: qty 50

## 2018-05-03 MED ORDER — CHLORHEXIDINE GLUCONATE CLOTH 2 % EX PADS
6.0000 | MEDICATED_PAD | Freq: Every day | CUTANEOUS | Status: DC
  Administered 2018-05-05 – 2018-05-07 (×3): 6 via TOPICAL
  Filled 2018-05-03: qty 6

## 2018-05-03 MED ORDER — SODIUM CHLORIDE 0.9 % IV SOLN
8.0000 mg/h | INTRAVENOUS | Status: DC
  Administered 2018-05-04 (×2): 8 mg/h via INTRAVENOUS
  Filled 2018-05-03 (×5): qty 80

## 2018-05-03 MED ORDER — SODIUM CHLORIDE 0.9 % IV SOLN
10.0000 mL/h | Freq: Once | INTRAVENOUS | Status: AC
  Administered 2018-05-03: 10 mL/h via INTRAVENOUS

## 2018-05-03 MED ORDER — INSULIN ASPART 100 UNIT/ML ~~LOC~~ SOLN
10.0000 [IU] | Freq: Once | SUBCUTANEOUS | Status: AC
  Administered 2018-05-03: 10 [IU] via INTRAVENOUS
  Filled 2018-05-03: qty 1

## 2018-05-03 MED ORDER — PANTOPRAZOLE SODIUM 40 MG IV SOLR: 40.0000 mg | Freq: Two times a day (BID) | INTRAVENOUS | Status: DC

## 2018-05-03 MED ORDER — NITROGLYCERIN 0.4 MG SL SUBL
0.4000 mg | SUBLINGUAL_TABLET | SUBLINGUAL | Status: DC | PRN
  Administered 2018-05-03 (×3): 0.4 mg via SUBLINGUAL
  Filled 2018-05-03: qty 1

## 2018-05-03 MED ORDER — ONDANSETRON HCL 4 MG/2ML IJ SOLN
INTRAMUSCULAR | Status: AC
  Filled 2018-05-03: qty 2

## 2018-05-03 MED ORDER — ONDANSETRON HCL 4 MG/2ML IJ SOLN
4.0000 mg | Freq: Once | INTRAMUSCULAR | Status: AC
  Administered 2018-05-03: 4 mg via INTRAVENOUS

## 2018-05-03 MED ORDER — DEXTROSE 50 % IV SOLN
1.0000 | Freq: Once | INTRAVENOUS | Status: AC
  Administered 2018-05-03: 50 mL via INTRAVENOUS
  Filled 2018-05-03: qty 50

## 2018-05-03 NOTE — ED Notes (Signed)
Dr Mariea Clonts made aware at this time of pt's critical lab values as reported by lab. Troponin 0.04ng/mL; K+ 7.58mmol/L

## 2018-05-03 NOTE — Progress Notes (Signed)
Notified by the emergency room about severe hyperkalemia with EKG changes. Recommend shifting measures until Hemodialysis. HD Orders have been placed and dialysis nurse notified. Dialysis will be available as soon as patient is in the ICU room.

## 2018-05-03 NOTE — ED Provider Notes (Addendum)
[facility] Emergency Department Provider Note  ____________________________________________  Time seen: Approximately 8:50 PM  I have reviewed the triage vital signs and the nursing notes.   HISTORY  Chief Complaint Chest Pain    HPI Stanley [name] is a 58 y.o. male with a history of ESRD on HD who has not had dialysis in over a week, CAD status post CABG and CHF, ongoing cocaine use, cirrhosis, presenting with chest pain.  The patient reports that he was laying in the bed this afternoon when he had an acute onset of a central chest pain.  He has not been having shortness of breath, palpitations, lightheadedness or syncope.  He does also report black stools, which have been going on for over a week.  His last cocaine use was last week.  Past Medical History:  Diagnosis Date  . Alcohol abuse   . CHF (congestive heart failure) ([place])   . Cirrhosis ([place])   . Coronary artery disease 2009  . Drug abuse ([place])   . End stage renal disease on dialysis [facility]) NEPHROLOGIST-   DR [facility]  IN [place]   HEMODIALYSIS --   TUES/  THURS/  SAT  . Gastrointestinal bleed 06/13/2017   From chart...hx of multiple GI bleeds  . GERD (gastroesophageal reflux disease)   . Hyperlipidemia   . Hypertension   . PAD (peripheral artery disease) ([place])   . Renal insufficiency    Per pt, 32 oz fluid restriction per day  . S/P triple vessel bypass 06/09/2016   2009ish  . Suicidal ideation    & HOMICIDAL IDEATION --  06-16-2013   ADMITTED TO BEHAVIOR HEALTH    Patient Active Problem List   Diagnosis Date Noted  . PAD (peripheral artery disease) ([place]) 04/14/2018  . Healthcare-associated pneumonia 01/15/2018  . Acute on chronic respiratory failure ([place]) 01/15/2018  . Acute pulmonary edema ([place]) 12/23/2017  . Acute respiratory failure ([place]) 10/29/2017  . ESRD (end stage renal disease) on dialysis ([place]) 07/28/2017  . Protein-calorie malnutrition, severe 06/14/2017  . Encounter for  dialysis [facility])   . Palliative care by specialist   . Goals of care, counseling/discussion   . Malnutrition of moderate degree 06/05/2017  . Secondary esophageal varices without bleeding ([place])   . Stomach irritation   . Idiopathic esophageal varices without bleeding ([place])   . Alcoholic hepatitis with ascites 05/24/2017  . ESRD (end stage renal disease) ([place]) 04/28/2017  . Uremia 03/08/2017  . ESRD on hemodialysis ([place]) 03/03/2017  . Weakness 02/28/2017  . Hypocalcemia 02/22/2017  . Shortness of breath 11/26/2016  . COPD (chronic obstructive pulmonary disease) ([place]) 10/30/2016  . COPD exacerbation ([place]) 10/29/2016  . Anemia   . Heme positive stool   . Ulceration of intestine   . Benign neoplasm of transverse colon   . Acute gastrointestinal hemorrhage   . Esophageal candidiasis ([place])   . Angiodysplasia of intestinal tract   . Acute respiratory failure with hypoxia ([place]) 07/03/2016  . GI bleeding 06/24/2016  . Rectal bleeding 06/14/2016  . Anemia of chronic disease 06/01/2016  . MRSA carrier 06/01/2016  . Chronic renal failure 05/23/2016  . Ischemic heart disease 05/23/2016  . Angiodysplasia of small intestine   . Melena   . Small bowel bleed not requiring more than 4 units of blood in 24 hours, ICU, or surgery   . Anemia due to chronic blood loss   . Abdominal pain 05/05/2016  . Acute posthemorrhagic anemia 04/17/2016  . Gastrointestinal bleed 04/17/2016  .  History of esophagogastroduodenoscopy (EGD) 04/17/2016  . Elevated troponin 04/17/2016  . Alcohol abuse 04/17/2016  . Upper GI bleed 01/19/2016  . Blood in stool   . Angiodysplasia of stomach and duodenum with hemorrhage   . Gastritis   . Reflux esophagitis   . GI bleed 05/16/2015  . Acute GI bleeding   . Symptomatic anemia 04/30/2015  . HTN (hypertension) 04/06/2015  . GERD (gastroesophageal reflux disease) 04/06/2015  . HLD (hyperlipidemia) 04/06/2015  . Dyspnea 04/06/2015  . Cirrhosis of liver with ascites  ([place]) 04/06/2015  . Ascites 04/06/2015  . GIB (gastrointestinal bleeding) 03/23/2015  . Homicidal ideation 06/19/2013  . Suicidal intent 06/19/2013  . Homicidal ideations 06/19/2013  . Hyperkalemia 06/16/2013  . Mandible fracture ([place]) 06/05/2013  . Fracture, mandible ([place]) 06/02/2013  . Coronary atherosclerosis of native coronary artery 06/02/2013  . ESRD on dialysis ([place]) 06/02/2013  . Mandible open fracture ([place]) 06/02/2013    Past Surgical History:  Procedure Laterality Date  . A/V FISTULAGRAM Right 06/06/2017   Procedure: A/V FISTULAGRAM;  Surgeon: Katha Cabal, MD;  Location: [place] CV LAB;  Service: Cardiovascular;  Laterality: Right;  . A/V SHUNT INTERVENTION N/A 06/06/2017   Procedure: A/V SHUNT INTERVENTION;  Surgeon: Katha Cabal, MD;  Location: [place] CV LAB;  Service: Cardiovascular;  Laterality: N/A;  . AGILE CAPSULE N/A 06/19/2016   Procedure: AGILE CAPSULE;  Surgeon: [name], MD;  Location: [facility] ENDOSCOPY;  Service: Endoscopy;  Laterality: N/A;  . COLONOSCOPY WITH PROPOFOL N/A 06/18/2016   Procedure: COLONOSCOPY WITH PROPOFOL;  Surgeon: [name], MD;  Location: [facility] ENDOSCOPY;  Service: Endoscopy;  Laterality: N/A;  . COLONOSCOPY WITH PROPOFOL N/A 08/12/2016   Procedure: COLONOSCOPY WITH PROPOFOL;  Surgeon: [name], MD;  Location: [facility] ENDOSCOPY;  Service: Endoscopy;  Laterality: N/A;  . COLONOSCOPY WITH PROPOFOL N/A 05/05/2017   Procedure: COLONOSCOPY WITH PROPOFOL;  Surgeon: Manya Silvas, MD;  Location: [facility] ENDOSCOPY;  Service: Endoscopy;  Laterality: N/A;  . CORONARY ANGIOPLASTY  ?   PT UNABLE TO TELL IF  BEFORE OR AFTER  CABG  . CORONARY ARTERY BYPASS GRAFT  2008  ([place] , [place])   3 VESSEL  . DIALYSIS FISTULA CREATION  LAST SURGERY  APPOX  2008  . ENTEROSCOPY N/A 05/10/2016   Procedure: ENTEROSCOPY;  Surgeon: Jerene Bears, MD;  Location: [place];  Service: Gastroenterology;  Laterality: N/A;  . ENTEROSCOPY N/A 08/12/2016    Procedure: ENTEROSCOPY;  Surgeon: [name], MD;  Location: [facility] ENDOSCOPY;  Service: Endoscopy;  Laterality: N/A;  . ENTEROSCOPY Left 06/03/2017   Procedure: ENTEROSCOPY;  Surgeon: Virgel Manifold, MD;  Location: [facility] ENDOSCOPY;  Service: Endoscopy;  Laterality: Left;  Procedure date will ultimately depend on when patient is medically optimized before the procedure, pending hemodialysis and blood transfusions etc. Will place on schedule and change depending on clinical status.   . ENTEROSCOPY N/A 06/05/2017   Procedure: ENTEROSCOPY;  Surgeon: Virgel Manifold, MD;  Location: [facility] ENDOSCOPY;  Service: Endoscopy;  Laterality: N/A;  . ENTEROSCOPY N/A 06/15/2017   Procedure: Push ENTEROSCOPY;  Surgeon: [name], MD;  Location: [facility] ENDOSCOPY;  Service: Endoscopy;  Laterality: N/A;  . ESOPHAGOGASTRODUODENOSCOPY N/A 05/07/2015   Procedure: ESOPHAGOGASTRODUODENOSCOPY (EGD);  Surgeon: Hulen Luster, MD;  Location: [facility] ENDOSCOPY;  Service: Endoscopy;  Laterality: N/A;  . ESOPHAGOGASTRODUODENOSCOPY (EGD) WITH PROPOFOL N/A 05/17/2015   Procedure: ESOPHAGOGASTRODUODENOSCOPY (EGD) WITH PROPOFOL;  Surgeon: [name], MD;  Location: [facility] ENDOSCOPY;  Service: Endoscopy;  Laterality: N/A;  .  ESOPHAGOGASTRODUODENOSCOPY (EGD) WITH PROPOFOL N/A 01/20/2016   Procedure: ESOPHAGOGASTRODUODENOSCOPY (EGD) WITH PROPOFOL;  Surgeon: [name], MD;  Location: [facility] ENDOSCOPY;  Service: Endoscopy;  Laterality: N/A;  . ESOPHAGOGASTRODUODENOSCOPY (EGD) WITH PROPOFOL N/A 04/17/2016   Procedure: ESOPHAGOGASTRODUODENOSCOPY (EGD) WITH PROPOFOL;  Surgeon: Lin Landsman, MD;  Location: [facility] ENDOSCOPY;  Service: Gastroenterology;  Laterality: N/A;  . ESOPHAGOGASTRODUODENOSCOPY (EGD) WITH PROPOFOL  05/09/2016   Procedure: ESOPHAGOGASTRODUODENOSCOPY (EGD) WITH PROPOFOL;  Surgeon: Jerene Bears, MD;  Location: [place];  Service: Endoscopy;;  . ESOPHAGOGASTRODUODENOSCOPY (EGD) WITH PROPOFOL N/A 06/16/2016   Procedure:  ESOPHAGOGASTRODUODENOSCOPY (EGD) WITH PROPOFOL;  Surgeon: [name], MD;  Location: [facility] ENDOSCOPY;  Service: Endoscopy;  Laterality: N/A;  . ESOPHAGOGASTRODUODENOSCOPY (EGD) WITH PROPOFOL N/A 05/05/2017   Procedure: ESOPHAGOGASTRODUODENOSCOPY (EGD) WITH PROPOFOL;  Surgeon: Manya Silvas, MD;  Location: [facility] ENDOSCOPY;  Service: Endoscopy;  Laterality: N/A;  . ESOPHAGOGASTRODUODENOSCOPY (EGD) WITH PROPOFOL N/A 06/15/2017   Procedure: ESOPHAGOGASTRODUODENOSCOPY (EGD) WITH PROPOFOL;  Surgeon: [name], MD;  Location: [facility] ENDOSCOPY;  Service: Endoscopy;  Laterality: N/A;  . ESOPHAGOGASTRODUODENOSCOPY (EGD) WITH PROPOFOL N/A 04/27/2018   Procedure: ESOPHAGOGASTRODUODENOSCOPY (EGD) WITH PROPOFOL;  Surgeon: [name], MD;  Location: [facility] ENDOSCOPY;  Service: Endoscopy;  Laterality: N/A;  . GIVENS CAPSULE STUDY N/A 05/07/2016   Procedure: GIVENS CAPSULE STUDY;  Surgeon: Doran Stabler, MD;  Location: [place];  Service: Endoscopy;  Laterality: N/A;  . LOWER EXTREMITY ANGIOGRAPHY Left 04/19/2018   Procedure: LOWER EXTREMITY ANGIOGRAPHY;  Surgeon: Algernon Huxley, MD;  Location: [place] CV LAB;  Service: Cardiovascular;  Laterality: Left;  Marland Kitchen MANDIBULAR HARDWARE REMOVAL N/A 07/29/2013   Procedure: REMOVAL OF ARCH BARS;  Surgeon: Theodoro Kos, DO;  Location: [place];  Service: Plastics;  Laterality: N/A;  . ORIF MANDIBULAR FRACTURE N/A 06/05/2013   Procedure: REPAIR OF MANDIBULAR FRACTURE x 2 with maxillo-mandibular fixation ;  Surgeon: Theodoro Kos, DO;  Location: [place];  Service: Plastics;  Laterality: N/A;  . PARACENTESIS    . PERIPHERAL ARTERIAL STENT GRAFT Left     Current Outpatient Rx  . Order #: [id number] Class: Normal  . Order #: [id number] Class: Print  . Order #: [id number] Class: Historical Med  . Order #: [id number] Class: Normal  . Order #: [id number] Class: Normal  . Order #: [id number] Class: Historical Med  . Order #: [id number] Class: Print  . Order #:  [id number] Class: Historical Med  . Order #: [id number] Class: Normal  . Order #: [id number] Class: Historical Med  . Order #: [id number] Class: Print  . Order #: [id number] Class: Print  . Order #: [id number] Class: Normal    Allergies Patient has no known allergies.  Family History  Problem Relation Age of Onset  . Colon cancer Mother   . Cancer Father   . Cancer Sister   . Kidney disease Brother     Social History Social History   Tobacco Use  . Smoking status: Current Every Day Smoker    Packs/day: 0.15    Years: 40.00    Pack years: 6.00    Types: Cigarettes  . Smokeless tobacco: Never Used  Substance Use Topics  . Alcohol use: No    Comment: pt reports quitting after learning about cirrhosis  . Drug use: No    Frequency: 7.0 times per week    Types: Marijuana, Cocaine    Review of Systems Constitutional: No fever/chills.  No lightheadedness or syncope.  Positive general malaise. Eyes: No visual changes. ENT: No sore throat. No congestion or rhinorrhea. Cardiovascular: Positive chest pain. Denies palpitations. Respiratory:  Denies shortness of breath.  No cough. Gastrointestinal: No abdominal pain.  Positive chronic abdominal distention.  No nausea, no vomiting.  No diarrhea.  No constipation. Genitourinary: Negative for dysuria. Musculoskeletal: Negative for back pain. Skin: Negative for rash. Neurological: Negative for headaches. No focal numbness, tingling or weakness.  Psychiatric:Positive cocaine use within the last week.    ____________________________________________   PHYSICAL EXAM:  VITAL SIGNS: ED Triage Vitals [05/03/18 2039]  Enc Vitals Group     BP (!) 141/89     Pulse Rate (!) 109     Resp 20     Temp 98.7 F (37.1 C)     Temp Source Oral     SpO2 93 %     Weight      Height      Head Circumference      Peak Flow      Pain Score      Pain Loc      Pain Edu?      Excl. in [place]?     Constitutional: Alert, oriented, chronically ill  appearing and not well appearing tonight. Eyes: Conjunctivae are normal.  EOMI. + scleral icterus. Head: Atraumatic. Nose: No congestion/rhinnorhea. Mouth/Throat: Mucous membranes are dry.  Neck: No stridor.  Supple.  No JVD.  No meningismus. Cardiovascular: Fast rate, regular rhythm. No murmurs, rubs or gallops.  Respiratory: Mild tachypnea without accessory muscle use or retractions.  Minimal rales in the bases bilaterally.  Prolonged expiratory phase.  Able to speak in full sentences without difficulty.  O2 sats are in the low to mid 90s on room air.. Gastrointestinal: Soft, nontender. + Distended.  No guarding or rebound.  No peritoneal signs. Genitourinary: No evidence of external hemorrhoids or palpable internal hemorrhoids.  The patient has black stool which is guaiac positive.   Musculoskeletal: Symmetric pitting LE edema. No ttp in the calves or palpable cords.  Negative Homan's sign. Neurologic:  A&Ox3.  Speech is clear.  Face and smile are symmetric.  EOMI.  Moves all extremities well. Skin:  Skin is warm, dry and intact. No rash noted.  Positive pallor. Psychiatric: Mood and affect are normal.  ____________________________________________   LABS (all labs ordered are listed, but only abnormal results are displayed)  Labs Reviewed  CBC - Abnormal; Notable for the following components:      Result Value   WBC 11.7 (*)    RBC 2.76 (*)    Hemoglobin 6.8 (*)    HCT 23.2 (*)    MCH 24.6 (*)    MCHC 29.3 (*)    RDW 19.7 (*)    Platelets 447 (*)    All other components within normal limits  GLUCOSE, CAPILLARY  TROPONIN I  COMPREHENSIVE METABOLIC PANEL  PROTIME-INR  APTT  BRAIN NATRIURETIC PEPTIDE  CBG MONITORING, ED  TYPE AND SCREEN  PREPARE RBC (CROSSMATCH)  TYPE AND SCREEN   ____________________________________________  EKG  ED ECG REPORT I, Anne-Caroline Mariea Clonts, the attending physician, personally viewed and interpreted this ECG.   Date: 05/03/2018  EKG Time:  2030  Rate: 97  Rhythm: normal sinus rhythm  Axis: normal  Intervals:peaked twaves with QRS prolongation  ST&T Change: No STEMI  Repeat EKG: ED ECG REPORT I, Anne-Caroline Mariea Clonts, the attending physician, personally viewed and interpreted this ECG.   Date: 05/03/2018  EKG Time: 2225  Rate: 98  Rhythm: normal sinus rhythm  Axis: leftward  Intervals:Improved peak T waves with improving QRS width  ST&T Change: No STEMI   ____________________________________________  RADIOLOGY  No results found.  ____________________________________________   PROCEDURES  Procedure(s) performed: None  Procedures  Critical Care performed: Yes, see critical care note(s) ____________________________________________   INITIAL IMPRESSION / ASSESSMENT AND PLAN / ED COURSE  Pertinent labs & imaging results that were available during my care of the patient were reviewed by me and considered in my medical decision making (see chart for details).  58 y.o. male w/ ESRD on HD, COPD, CAD and CHF, cirrhosis presenting w/ acute onset of CP, melena.  I am very concerned about the patient's presentation tonight.  I have seen this patient multiple times in the emergency department and today he is very unwell appearing and appears more pale than usual.  His EKG is concerning for hyperkalemia as he has hyperacute T waves as well as QRS widening.  Immediate calcium gluconate, insulin and dextrose, as well as an albuterol treatment have been ordered.  I presume he is likely significantly hyperkalemic and will require dialysis.  He does have some rales in the bases bilaterally which is also consistent with edema.  His EKG does not show a STEMI or severe ischemic changes, but he may have a strain on his heart.  I will not give him an aspirin at this time given his history of melena.  He likely has a baseline coagulopathy from his liver disease.  No focal pain on his abdomen.  Consider upper GI bleed; I will do a  rectal exam to see if he has blood in his stool.  Patient will require admission to the hospital.  ----------------------------------------- 9:20 PM on 05/03/2018 -----------------------------------------  The patient's rectal examination is positive for melena, and he also has an acute on chronic anemia today.  1 unit of blood has been ordered; I am concerned that the patient will have worsening fluid overload so we will do 1 unit run it slowly, and re-evaluate.  They need more blood, but they will be determined clinically.  Protonix bolus and drip have also been ordered.  I reviewed the patient's chart, and he does have a history of upper GI bleed with angiodysplasia of the stomach.  ----------------------------------------- 10:12 PM on 05/03/2018 -----------------------------------------  The patient is hyperkalemic to 7.3.  He has received calcium gluconate, insulin, dextrose, and albuterol treatment.  I have spoken with Dr. [name], who will do emergent dialysis tonight for him.  In addition, the patient has an elevated troponin, which is baseline for him.  I have spoken with Dr. Jannifer Franklin, the admitting hospitalist, who will manage the patient's admission.  ----------------------------------------- 10:28 PM on 05/03/2018 -----------------------------------------  I have repeated the patient's EKG, which seems to show some improvement in his peak T waves which is reassuring for temporizing measures I have ordered for his hyperkalemia.  We will continue to monitor him closely.  CRITICAL CARE Performed by: Eula Listen   Total critical care time: 45 minutes  Critical care time was exclusive of separately billable procedures and treating other patients.  Critical care was necessary to treat or prevent imminent or life-threatening deterioration.  Critical care was time spent personally by me on the following activities: development of treatment plan with patient and/or surrogate as  well as nursing, discussions with consultants, evaluation of patient's response to treatment, examination of patient, obtaining history from patient or surrogate, ordering and performing treatments and interventions, ordering and review of laboratory studies, ordering and review of radiographic studies, pulse oximetry and re-evaluation of patient's condition.   ____________________________________________  FINAL CLINICAL IMPRESSION(S) / ED  DIAGNOSES  Final diagnoses:  Gastrointestinal hemorrhage with melena  Acute on chronic blood loss anemia  Chest pain, unspecified type         NEW MEDICATIONS STARTED DURING THIS VISIT:  New Prescriptions   No medications on file      Eula Listen, MD 05/03/18 2059    Eula Listen, MD 05/03/18 [id number]    Eula Listen, MD 05/03/18 2228

## 2018-05-03 NOTE — ED Triage Notes (Signed)
Pt arrived via EMS from home. Per EMS pt was discharged on Monday due to rectal bleeding. Pt has not been to Dialysis x1 week and today since 1500 this evening pt has had central chest pain with SOB. MD at bedside for evaluation.

## 2018-05-04 ENCOUNTER — Other Ambulatory Visit: Payer: Self-pay

## 2018-05-04 ENCOUNTER — Ambulatory Visit (INDEPENDENT_AMBULATORY_CARE_PROVIDER_SITE_OTHER): Payer: Medicare Other | Admitting: Nurse Practitioner

## 2018-05-04 DIAGNOSIS — D62 Acute posthemorrhagic anemia: Secondary | ICD-10-CM | POA: Diagnosis present

## 2018-05-04 DIAGNOSIS — F17210 Nicotine dependence, cigarettes, uncomplicated: Secondary | ICD-10-CM | POA: Diagnosis present

## 2018-05-04 DIAGNOSIS — I5032 Chronic diastolic (congestive) heart failure: Secondary | ICD-10-CM | POA: Diagnosis present

## 2018-05-04 DIAGNOSIS — Z7982 Long term (current) use of aspirin: Secondary | ICD-10-CM | POA: Diagnosis not present

## 2018-05-04 DIAGNOSIS — E875 Hyperkalemia: Secondary | ICD-10-CM | POA: Diagnosis present

## 2018-05-04 DIAGNOSIS — Z951 Presence of aortocoronary bypass graft: Secondary | ICD-10-CM | POA: Diagnosis not present

## 2018-05-04 DIAGNOSIS — Z9115 Patient's noncompliance with renal dialysis: Secondary | ICD-10-CM | POA: Diagnosis not present

## 2018-05-04 DIAGNOSIS — N186 End stage renal disease: Secondary | ICD-10-CM | POA: Diagnosis present

## 2018-05-04 DIAGNOSIS — I2510 Atherosclerotic heart disease of native coronary artery without angina pectoris: Secondary | ICD-10-CM | POA: Diagnosis present

## 2018-05-04 DIAGNOSIS — K7460 Unspecified cirrhosis of liver: Secondary | ICD-10-CM | POA: Diagnosis present

## 2018-05-04 DIAGNOSIS — D631 Anemia in chronic kidney disease: Secondary | ICD-10-CM | POA: Diagnosis present

## 2018-05-04 DIAGNOSIS — K209 Esophagitis, unspecified: Secondary | ICD-10-CM

## 2018-05-04 DIAGNOSIS — K219 Gastro-esophageal reflux disease without esophagitis: Secondary | ICD-10-CM | POA: Diagnosis present

## 2018-05-04 DIAGNOSIS — K921 Melena: Secondary | ICD-10-CM | POA: Diagnosis not present

## 2018-05-04 DIAGNOSIS — K2211 Ulcer of esophagus with bleeding: Secondary | ICD-10-CM | POA: Diagnosis present

## 2018-05-04 DIAGNOSIS — K922 Gastrointestinal hemorrhage, unspecified: Secondary | ICD-10-CM | POA: Diagnosis not present

## 2018-05-04 DIAGNOSIS — N2581 Secondary hyperparathyroidism of renal origin: Secondary | ICD-10-CM | POA: Diagnosis present

## 2018-05-04 DIAGNOSIS — E785 Hyperlipidemia, unspecified: Secondary | ICD-10-CM | POA: Diagnosis present

## 2018-05-04 DIAGNOSIS — I739 Peripheral vascular disease, unspecified: Secondary | ICD-10-CM | POA: Diagnosis present

## 2018-05-04 DIAGNOSIS — D649 Anemia, unspecified: Secondary | ICD-10-CM | POA: Diagnosis not present

## 2018-05-04 DIAGNOSIS — I132 Hypertensive heart and chronic kidney disease with heart failure and with stage 5 chronic kidney disease, or end stage renal disease: Secondary | ICD-10-CM | POA: Diagnosis present

## 2018-05-04 DIAGNOSIS — K920 Hematemesis: Secondary | ICD-10-CM

## 2018-05-04 DIAGNOSIS — I272 Pulmonary hypertension, unspecified: Secondary | ICD-10-CM | POA: Diagnosis present

## 2018-05-04 DIAGNOSIS — Z992 Dependence on renal dialysis: Secondary | ICD-10-CM | POA: Diagnosis not present

## 2018-05-04 DIAGNOSIS — J449 Chronic obstructive pulmonary disease, unspecified: Secondary | ICD-10-CM | POA: Diagnosis present

## 2018-05-04 DIAGNOSIS — Z7951 Long term (current) use of inhaled steroids: Secondary | ICD-10-CM | POA: Diagnosis not present

## 2018-05-04 DIAGNOSIS — Z79899 Other long term (current) drug therapy: Secondary | ICD-10-CM | POA: Diagnosis not present

## 2018-05-04 DIAGNOSIS — Z7902 Long term (current) use of antithrombotics/antiplatelets: Secondary | ICD-10-CM | POA: Diagnosis not present

## 2018-05-04 DIAGNOSIS — R188 Other ascites: Secondary | ICD-10-CM | POA: Diagnosis present

## 2018-05-04 LAB — PREPARE RBC (CROSSMATCH)

## 2018-05-04 LAB — HEMOGLOBIN: Hemoglobin: 5.9 g/dL — ABNORMAL LOW (ref 13.0–17.0)

## 2018-05-04 LAB — PHOSPHORUS: Phosphorus: 4.4 mg/dL (ref 2.5–4.6)

## 2018-05-04 MED ORDER — RENA-VITE PO TABS
1.0000 | ORAL_TABLET | Freq: Every day | ORAL | Status: DC
  Administered 2018-05-04 – 2018-05-06 (×2): 1 via ORAL
  Filled 2018-05-04 (×3): qty 1

## 2018-05-04 MED ORDER — SODIUM CHLORIDE 0.9% IV SOLUTION
Freq: Once | INTRAVENOUS | Status: AC
  Administered 2018-05-04: 20:00:00 via INTRAVENOUS

## 2018-05-04 MED ORDER — ACETAMINOPHEN 325 MG PO TABS: 650.0000 mg | ORAL_TABLET | Freq: Four times a day (QID) | ORAL | Status: DC | PRN

## 2018-05-04 MED ORDER — ONDANSETRON HCL 4 MG PO TABS: 4.0000 mg | ORAL_TABLET | Freq: Four times a day (QID) | ORAL | Status: DC | PRN

## 2018-05-04 MED ORDER — RENA-VITE PO TABS
1.0000 | ORAL_TABLET | Freq: Every day | ORAL | Status: DC
  Administered 2018-05-05 – 2018-05-06 (×2): 1 via ORAL
  Filled 2018-05-04 (×3): qty 1

## 2018-05-04 MED ORDER — LABETALOL HCL 100 MG PO TABS
100.0000 mg | ORAL_TABLET | Freq: Every day | ORAL | Status: DC
  Administered 2018-05-04 – 2018-05-06 (×2): 100 mg via ORAL
  Filled 2018-05-04 (×4): qty 1

## 2018-05-04 MED ORDER — ACETAMINOPHEN 650 MG RE SUPP: 650.0000 mg | Freq: Four times a day (QID) | RECTAL | Status: DC | PRN

## 2018-05-04 MED ORDER — VITAMIN C 500 MG PO TABS
500.0000 mg | ORAL_TABLET | Freq: Two times a day (BID) | ORAL | Status: DC
  Administered 2018-05-04 – 2018-05-07 (×7): 500 mg via ORAL
  Filled 2018-05-04 (×7): qty 1

## 2018-05-04 MED ORDER — IPRATROPIUM-ALBUTEROL 0.5-2.5 (3) MG/3ML IN SOLN: 3.0000 mL | Freq: Four times a day (QID) | RESPIRATORY_TRACT | Status: DC | PRN

## 2018-05-04 MED ORDER — FOLIC ACID 1 MG PO TABS
1.0000 mg | ORAL_TABLET | Freq: Every day | ORAL | Status: DC
  Administered 2018-05-04 – 2018-05-07 (×4): 1 mg via ORAL
  Filled 2018-05-04 (×4): qty 1

## 2018-05-04 MED ORDER — GABAPENTIN 300 MG PO CAPS
300.0000 mg | ORAL_CAPSULE | Freq: Three times a day (TID) | ORAL | Status: DC
  Administered 2018-05-04 – 2018-05-07 (×9): 300 mg via ORAL
  Filled 2018-05-04 (×9): qty 1

## 2018-05-04 MED ORDER — CALCIUM ACETATE (PHOS BINDER) 667 MG PO CAPS
2001.0000 mg | ORAL_CAPSULE | Freq: Three times a day (TID) | ORAL | Status: DC
  Administered 2018-05-04 – 2018-05-07 (×2): 2001 mg via ORAL
  Filled 2018-05-04 (×6): qty 3

## 2018-05-04 MED ORDER — MOMETASONE FURO-FORMOTEROL FUM 200-5 MCG/ACT IN AERO
2.0000 | INHALATION_SPRAY | Freq: Two times a day (BID) | RESPIRATORY_TRACT | Status: DC
  Administered 2018-05-04 – 2018-05-07 (×6): 2 via RESPIRATORY_TRACT
  Filled 2018-05-04: qty 8.8

## 2018-05-04 MED ORDER — SUCRALFATE 1 GM/10ML PO SUSP
1.0000 g | Freq: Three times a day (TID) | ORAL | Status: DC
  Administered 2018-05-04 – 2018-05-07 (×9): 1 g via ORAL
  Filled 2018-05-04 (×9): qty 10

## 2018-05-04 MED ORDER — NEPRO/CARBSTEADY PO LIQD
237.0000 mL | Freq: Two times a day (BID) | ORAL | Status: DC
  Administered 2018-05-04 – 2018-05-06 (×5): 237 mL via ORAL

## 2018-05-04 MED ORDER — TIOTROPIUM BROMIDE MONOHYDRATE 18 MCG IN CAPS
18.0000 ug | ORAL_CAPSULE | Freq: Every day | RESPIRATORY_TRACT | Status: DC
  Administered 2018-05-05 – 2018-05-07 (×3): 18 ug via RESPIRATORY_TRACT
  Filled 2018-05-04: qty 5

## 2018-05-04 MED ORDER — HEPARIN SODIUM (PORCINE) 5000 UNIT/ML IJ SOLN: 5000.0000 [IU] | Freq: Three times a day (TID) | INTRAMUSCULAR | Status: DC

## 2018-05-04 MED ORDER — ONDANSETRON HCL 4 MG/2ML IJ SOLN
4.0000 mg | Freq: Four times a day (QID) | INTRAMUSCULAR | Status: DC | PRN
  Administered 2018-05-06: 4 mg via INTRAVENOUS
  Filled 2018-05-04: qty 2

## 2018-05-04 MED ORDER — DOCUSATE SODIUM 100 MG PO CAPS
100.0000 mg | ORAL_CAPSULE | Freq: Two times a day (BID) | ORAL | Status: DC
  Administered 2018-05-05 – 2018-05-07 (×3): 100 mg via ORAL
  Filled 2018-05-04 (×5): qty 1

## 2018-05-04 MED ORDER — OXYCODONE HCL 5 MG PO TABS
5.0000 mg | ORAL_TABLET | ORAL | Status: DC | PRN
  Administered 2018-05-04 – 2018-05-07 (×14): 5 mg via ORAL
  Filled 2018-05-04 (×14): qty 1

## 2018-05-04 MED ORDER — CALCIUM GLUCONATE 10 % IV SOLN
1.0000 g | Freq: Once | INTRAVENOUS | Status: AC
  Administered 2018-05-04: 1 g via INTRAVENOUS
  Filled 2018-05-04: qty 10

## 2018-05-04 NOTE — ED Notes (Signed)
Report received, care of pt assumed at this time. Pt resting quietly on ER stretcher, NAD noted.  Awaiting inpatient bed placement at this time.  Will continue to monitor.

## 2018-05-04 NOTE — Progress Notes (Signed)
[facility], [place] 05/04/18  Subjective:   Patient known to our practice from previous admissions.  Currently he does not have transportation to get to an outpatient dialysis center therefore has to come to the ER for emergency dialysis.  Last dialysis was on February 13 This time he presents to the emergency room via EMS from home due to rectal bleeding.  Evaluation showed severe hyperkalemia with potassium of 7.3 Emergency hemodialysis was requested by ER Patient underwent emergency hemodialysis last night.  1500 cc of fluid was removed Complain of vomiting blood and blood in the stool.   Objective:  Vital signs in last 24 hours:  Temp:  [98.2 F (36.8 C)-98.8 F (37.1 C)] 98.6 F (37 C) (02/21 1322) Pulse Rate:  [81-109] 83 (02/21 1322) Resp:  [13-21] 16 (02/21 1322) BP: (99-141)/(56-93) 116/81 (02/21 1322) SpO2:  [89 %-100 %] 100 % (02/21 1322) Weight:  [78.6 kg] 78.6 kg (02/21 0130)  Weight change:  Filed Weights   05/04/18 0130  Weight: 78.6 kg    Intake/Output:    Intake/Output Summary (Last 24 hours) at 05/04/2018 1514 Last data filed at 05/04/2018 0515 Gross per 24 hour  Intake 150 ml  Output 1500 ml  Net -1350 ml     Physical Exam: General:  Chronically ill-appearing, laying in the bed  HEENT  moist oral mucous membranes  Pulm/lungs  clear to auscultation bilaterally  CVS/Heart  no rub  Abdomen:   Distended, nontender, ascites  Extremities:  No edema  Neurologic:  Alert, oriented  Skin:  No acute rashes  Access:  Right IJ PermCath       Basic Metabolic Panel:  Recent Labs  Lab 04/28/18 0602 04/28/18 1850 05/03/18 06-16-2052 05/04/18 0223  NA 140 137 138  --   K 5.5* 5.5* 7.3*  --   CL 96* 93* 92*  --   CO2 34* 31 29  --   GLUCOSE 142* 102* 113*  --   BUN 66* 72* 93*  --   CREATININE 9.98* 10.33* 12.04*  --   CALCIUM 8.1* 7.7* 7.9*  --   MG 2.3  --   --   --   PHOS 5.3* 5.6*  --  4.4     CBC: Recent Labs  Lab  04/27/18 1901 04/28/18 0047 04/28/18 0602 04/28/18 1850 05/03/18 2054  WBC  --   --  11.2* 12.2* 11.7*  HGB 7.2* 7.9* 8.2* 7.6* 6.8*  HCT 25.8* 28.1* 29.9* 27.6* 23.2*  MCV  --   --  90.6 88.7 84.1  PLT  --   --  313 315 447*      Lab Results  Component Value Date   HEPBSAG Negative 06/16/2017   HEPBSAB Reactive 03/01/2017   HEPBIGM Negative 06/01/2016      Microbiology:  Recent Results (from the past 240 hour(s))  MRSA PCR Screening     Status: None   Collection Time: 04/26/18  1:09 PM  Result Value Ref Range Status   MRSA by PCR NEGATIVE NEGATIVE Final    Comment:        The GeneXpert MRSA Assay (FDA approved for NASAL specimens only), is one component of a comprehensive MRSA colonization surveillance program. It is not intended to diagnose MRSA infection nor to guide or monitor treatment for MRSA infections. Performed at [facility], [address], [place], [name] [address]     Coagulation Studies: Recent Labs    05/03/18 Jun 16, 2052  LABPROT 14.7  INR  1.16    Urinalysis: No results for input(s): COLORURINE, LABSPEC, PHURINE, GLUCOSEU, HGBUR, BILIRUBINUR, KETONESUR, PROTEINUR, UROBILINOGEN, NITRITE, LEUKOCYTESUR in the last 72 hours.  Invalid input(s): APPERANCEUR    Imaging: Dg Chest Portable 1 View  Result Date: 05/03/2018 CLINICAL DATA:  58 y/o M; rectal bleeding. Patient has not been to dialysis for 1 week. Central chest pain with shortness of breath. EXAM: PORTABLE CHEST 1 VIEW COMPARISON:  04/26/2018 chest radiograph. FINDINGS: Stable cardiomegaly given projection and technique. Right central venous catheter is stable in position projecting over lower SVC. Aortic calcific atherosclerosis. Post median sternotomy with wires in alignment. Stable reticular opacities with basilar predominance compatible with interstitial pulmonary edema. No new consolidation, effusion, or pneumothorax. No acute osseous abnormality is evident. IMPRESSION: Stable  cardiomegaly and interstitial pulmonary edema. Aortic Atherosclerosis (ICD10-I70.0). Electronically Signed   By: Kristine Garbe M.D.   On: 05/03/2018 22:36     Medications:   . pantoprozole (PROTONIX) infusion 8 mg/hr (05/04/18 1056)   . calcium acetate  2,001 mg Oral TID WC  . Chlorhexidine Gluconate Cloth  6 each Topical Q0600  . docusate sodium  100 mg Oral BID  . feeding supplement (NEPRO CARB STEADY)  237 mL Oral BID BM  . folic acid  1 mg Oral Daily  . gabapentin  300 mg Oral TID  . labetalol  100 mg Oral Daily  . mometasone-formoterol  2 puff Inhalation BID  . multivitamin  1 tablet Oral QHS  . multivitamin  1 tablet Oral QHS  . [START ON 05/07/2018] pantoprazole  40 mg Intravenous Q12H  . [START ON 05/05/2018] tiotropium  18 mcg Inhalation Daily  . vitamin C  500 mg Oral BID   acetaminophen **OR** acetaminophen, ipratropium-albuterol, nitroGLYCERIN, ondansetron **OR** ondansetron (ZOFRAN) IV, oxyCODONE  Assessment/ Plan:  58 y.o. African-American male withend stage renal disease on hemodialysis secondary to Alport's syndrome, ascites, hypertension, anemia of chronic kidney disease, coronary artery disease, peripheral vascular disease, hyperlipidemia, gastrointestinal AVMs, pulmonary hypertension , GI bleed. Endoscopy on 2/14 showing esophagitis.   [facility] TTS RIJ permcath.210 min  End-stage renal disease Severe hyperkalemia Anemia chronic kidney disease and GI bleed Ascites.  Last paracentesis February 14 yielding 7.3 L Secondary hyperparathyroidism  Patient received emergency hemodialysis last night.  Next hemodialysis will be planned for tomorrow.  Monitor electrolytes including potassium level during hospitalization.  Continue calcium acetate as binders.  Low phosphorus diet.  Previously GI had recommended for patient to undergo double-balloon enteroscopy at [facility].  Patient has not followed up.    LOS: 0 Kolby Schara 2/21/20203:14 PM  [place], [place]  Note: This note was prepared with Dragon dictation. Any transcription errors are unintentional

## 2018-05-04 NOTE — Plan of Care (Signed)
The patients hemoglobin had dropped again and a blood transfusion was ordered.  Problem: Education: Goal: Knowledge of General Education information will improve Description Including pain rating scale, medication(s)/side effects and non-pharmacologic comfort measures Outcome: Not Progressing   Problem: Health Behavior/Discharge Planning: Goal: Ability to manage health-related needs will improve Outcome: Not Progressing   Problem: Clinical Measurements: Goal: Ability to maintain clinical measurements within normal limits will improve Outcome: Not Progressing Goal: Will remain free from infection Outcome: Not Progressing Goal: Diagnostic test results will improve Outcome: Not Progressing Goal: Respiratory complications will improve Outcome: Not Progressing Goal: Cardiovascular complication will be avoided Outcome: Not Progressing   Problem: Activity: Goal: Risk for activity intolerance will decrease Outcome: Not Progressing   Problem: Nutrition: Goal: Adequate nutrition will be maintained Outcome: Not Progressing   Problem: Coping: Goal: Level of anxiety will decrease Outcome: Not Progressing   Problem: Elimination: Goal: Will not experience complications related to bowel motility Outcome: Not Progressing Goal: Will not experience complications related to urinary retention Outcome: Not Progressing   Problem: Pain Managment: Goal: General experience of comfort will improve Outcome: Not Progressing   Problem: Safety: Goal: Ability to remain free from injury will improve Outcome: Not Progressing   Problem: Skin Integrity: Goal: Risk for impaired skin integrity will decrease Outcome: Not Progressing

## 2018-05-04 NOTE — Progress Notes (Signed)
Pre HD Tx  Emergent Hemodialysis to correct hyperkalemia and remove waste buildup d/t client's inconsistent dialysis tx attendance.    05/04/18 0130  Neurological  Level of Consciousness Alert  Orientation Level Oriented X4;Other (comment) (oriented, yet noticeably distressed d/t GI issues&hyperK+)  Respiratory  Respiratory Pattern Regular  Bilateral Breath Sounds Diminished;Expiratory wheezes  Cardiac  Pulse Regular  Heart Sounds S1, S2  ECG Monitor Yes  Cardiac Rhythm NSR  Vascular  R Radial Pulse +2  L Radial Pulse +2  Edema Generalized;Periorbital;Facial  Integumentary  Integumentary (WDL) X  Skin Color Appropriate for ethnicity  Skin Condition Dry  Skin Integrity Catheter entry/exit site (HD cath)  Musculoskeletal  Musculoskeletal (WDL) X  Generalized Weakness Yes  GU Assessment  Genitourinary (WDL) X  Genitourinary Symptoms  (HD pt)  Psychosocial  Psychosocial (WDL) X  Patient Behaviors Appropriate for situation;Restless  Needs Expressed Physical  Emotional support given Given to patient

## 2018-05-04 NOTE — ED Provider Notes (Signed)
-----------------------------------------   12:10 AM on 05/04/2018 ----------------------------------------- Vital signs stable.  Apparently there is unlikely to be space in the ICU, so to facilitate dialysis we are recontacting nephrology about performing it emergently in the emergency department.  ----------------------------------------- 12:39 AM on 05/04/2018 -----------------------------------------  Discussed with Dr. [name] who will coordinate dialysis in the ED.  Repeat EKG performed, shows sinus rhythm rate of 91, left axis, left bundle, peak T waves, no significant interval change.  Vitals stable.  ----------------------------------------- 1:38 AM on 05/04/2018 -----------------------------------------  Vitals stable, dialysis initiating.  ----------------------------------------- 4:07 AM on 05/04/2018 -----------------------------------------  Vitals remain normal.  Sleeping, comfortable.  Dialysis ongoing, good flow.  No further episodes of vomiting or bloody stool.     Carrie Mew, MD 05/04/18 [phone]

## 2018-05-04 NOTE — Consult Note (Signed)
[name], MD [address] [place], [place], [place], [address] [address], [place], [place], [place], [address] Phone: [phone]  Fax: [phone]  Consultation  Referring Provider:     Dr. Marcille Blanco Primary Care Physician:  [name], MD Reason for Consultation:     Hematemesis Primary gastroenterologist: Dr. Vira Agar  Date of Admission:  05/03/2018 Date of Consultation:  05/04/2018         HPI:   Stanley [name] is a 58 y.o. male with history of cirrhosis, end-stage renal disease on dialysis, admitted with chest pain and hyperkalemia.  Patient missed dialysis for the last 2 weeks.  Potassium found to be 7.  GI consulted as patient reported hematemesis and dark stools and found to have hemoglobin around 6.5.  Patient reports one episode of emesis that was red in color yesterday night, and one episode of melena.  No further episodes of hematemesis, emesis, or melena since presentation to hospital.  Patient is hemodynamically stable.  Patient has recently been seen by gastroenterology as an inpatient for GI bleed.  Recently seen by Dr. Allen Norris last week for hematemesis.  He underwent EGD on February 14, for hematemesis, which showed grade D esophagitis and was otherwise normal.  As per previous GI notes, the patient has been recommended multiple times to go to [facility] for double-balloon enteroscopy and patient has not followed up with this recommendation.    Past Medical History:  Diagnosis Date  . Alcohol abuse   . CHF (congestive heart failure) ([place])   . Cirrhosis ([place])   . Coronary artery disease 2009  . Drug abuse ([place])   . End stage renal disease on dialysis [facility] [facility]) NEPHROLOGIST-   DR [name]  IN [place]   HEMODIALYSIS --   TUES/  THURS/  SAT  . Gastrointestinal bleed 06/13/2017   From chart...hx of multiple GI bleeds  . GERD (gastroesophageal reflux disease)   . Hyperlipidemia   . Hypertension   . PAD (peripheral artery disease) ([place])   . Renal  insufficiency    Per pt, 32 oz fluid restriction per day  . S/P triple vessel bypass 06/09/2016   2009ish  . Suicidal ideation    & HOMICIDAL IDEATION --  06-16-2013   ADMITTED TO BEHAVIOR HEALTH    Past Surgical History:  Procedure Laterality Date  . A/V FISTULAGRAM Right 06/06/2017   Procedure: A/V FISTULAGRAM;  Surgeon: Katha Cabal, MD;  Location: [place] CV LAB;  Service: Cardiovascular;  Laterality: Right;  . A/V SHUNT INTERVENTION N/A 06/06/2017   Procedure: A/V SHUNT INTERVENTION;  Surgeon: Katha Cabal, MD;  Location: [place] CV LAB;  Service: Cardiovascular;  Laterality: N/A;  . AGILE CAPSULE N/A 06/19/2016   Procedure: AGILE CAPSULE;  Surgeon: [name], MD;  Location: [facility] ENDOSCOPY;  Service: Endoscopy;  Laterality: N/A;  . COLONOSCOPY WITH PROPOFOL N/A 06/18/2016   Procedure: COLONOSCOPY WITH PROPOFOL;  Surgeon: [name], MD;  Location: [facility] ENDOSCOPY;  Service: Endoscopy;  Laterality: N/A;  . COLONOSCOPY WITH PROPOFOL N/A 08/12/2016   Procedure: COLONOSCOPY WITH PROPOFOL;  Surgeon: [name], MD;  Location: [facility] ENDOSCOPY;  Service: Endoscopy;  Laterality: N/A;  . COLONOSCOPY WITH PROPOFOL N/A 05/05/2017   Procedure: COLONOSCOPY WITH PROPOFOL;  Surgeon: Manya Silvas, MD;  Location: [facility] ENDOSCOPY;  Service: Endoscopy;  Laterality: N/A;  . CORONARY ANGIOPLASTY  ?   PT UNABLE TO TELL IF  BEFORE OR AFTER  CABG  . CORONARY ARTERY BYPASS GRAFT  2008  (  [place] , [place])   3 VESSEL  . DIALYSIS FISTULA CREATION  LAST SURGERY  APPOX  2008  . ENTEROSCOPY N/A 05/10/2016   Procedure: ENTEROSCOPY;  Surgeon: Jerene Bears, MD;  Location: [place];  Service: Gastroenterology;  Laterality: N/A;  . ENTEROSCOPY N/A 08/12/2016   Procedure: ENTEROSCOPY;  Surgeon: [name], MD;  Location: [facility] ENDOSCOPY;  Service: Endoscopy;  Laterality: N/A;  . ENTEROSCOPY Left 06/03/2017   Procedure: ENTEROSCOPY;  Surgeon: Virgel Manifold, MD;  Location: [facility] ENDOSCOPY;   Service: Endoscopy;  Laterality: Left;  Procedure date will ultimately depend on when patient is medically optimized before the procedure, pending hemodialysis and blood transfusions etc. Will place on schedule and change depending on clinical status.   . ENTEROSCOPY N/A 06/05/2017   Procedure: ENTEROSCOPY;  Surgeon: Virgel Manifold, MD;  Location: [facility] ENDOSCOPY;  Service: Endoscopy;  Laterality: N/A;  . ENTEROSCOPY N/A 06/15/2017   Procedure: Push ENTEROSCOPY;  Surgeon: [name], MD;  Location: [facility] ENDOSCOPY;  Service: Endoscopy;  Laterality: N/A;  . ESOPHAGOGASTRODUODENOSCOPY N/A 05/07/2015   Procedure: ESOPHAGOGASTRODUODENOSCOPY (EGD);  Surgeon: Hulen Luster, MD;  Location: [facility] ENDOSCOPY;  Service: Endoscopy;  Laterality: N/A;  . ESOPHAGOGASTRODUODENOSCOPY (EGD) WITH PROPOFOL N/A 05/17/2015   Procedure: ESOPHAGOGASTRODUODENOSCOPY (EGD) WITH PROPOFOL;  Surgeon: [name], MD;  Location: [facility] ENDOSCOPY;  Service: Endoscopy;  Laterality: N/A;  . ESOPHAGOGASTRODUODENOSCOPY (EGD) WITH PROPOFOL N/A 01/20/2016   Procedure: ESOPHAGOGASTRODUODENOSCOPY (EGD) WITH PROPOFOL;  Surgeon: [name], MD;  Location: [facility] ENDOSCOPY;  Service: Endoscopy;  Laterality: N/A;  . ESOPHAGOGASTRODUODENOSCOPY (EGD) WITH PROPOFOL N/A 04/17/2016   Procedure: ESOPHAGOGASTRODUODENOSCOPY (EGD) WITH PROPOFOL;  Surgeon: Lin Landsman, MD;  Location: [facility] ENDOSCOPY;  Service: Gastroenterology;  Laterality: N/A;  . ESOPHAGOGASTRODUODENOSCOPY (EGD) WITH PROPOFOL  05/09/2016   Procedure: ESOPHAGOGASTRODUODENOSCOPY (EGD) WITH PROPOFOL;  Surgeon: Jerene Bears, MD;  Location: [place];  Service: Endoscopy;;  . ESOPHAGOGASTRODUODENOSCOPY (EGD) WITH PROPOFOL N/A 06/16/2016   Procedure: ESOPHAGOGASTRODUODENOSCOPY (EGD) WITH PROPOFOL;  Surgeon: [name], MD;  Location: [facility] ENDOSCOPY;  Service: Endoscopy;  Laterality: N/A;  . ESOPHAGOGASTRODUODENOSCOPY (EGD) WITH PROPOFOL N/A 05/05/2017   Procedure: ESOPHAGOGASTRODUODENOSCOPY  (EGD) WITH PROPOFOL;  Surgeon: Manya Silvas, MD;  Location: [facility] ENDOSCOPY;  Service: Endoscopy;  Laterality: N/A;  . ESOPHAGOGASTRODUODENOSCOPY (EGD) WITH PROPOFOL N/A 06/15/2017   Procedure: ESOPHAGOGASTRODUODENOSCOPY (EGD) WITH PROPOFOL;  Surgeon: [name], MD;  Location: [facility] ENDOSCOPY;  Service: Endoscopy;  Laterality: N/A;  . ESOPHAGOGASTRODUODENOSCOPY (EGD) WITH PROPOFOL N/A 04/27/2018   Procedure: ESOPHAGOGASTRODUODENOSCOPY (EGD) WITH PROPOFOL;  Surgeon: [name], MD;  Location: [facility] ENDOSCOPY;  Service: Endoscopy;  Laterality: N/A;  . GIVENS CAPSULE STUDY N/A 05/07/2016   Procedure: GIVENS CAPSULE STUDY;  Surgeon: Doran Stabler, MD;  Location: [place];  Service: Endoscopy;  Laterality: N/A;  . LOWER EXTREMITY ANGIOGRAPHY Left 04/19/2018   Procedure: LOWER EXTREMITY ANGIOGRAPHY;  Surgeon: Algernon Huxley, MD;  Location: [place] CV LAB;  Service: Cardiovascular;  Laterality: Left;  Marland Kitchen MANDIBULAR HARDWARE REMOVAL N/A 07/29/2013   Procedure: REMOVAL OF ARCH BARS;  Surgeon: Theodoro Kos, DO;  Location: [place];  Service: Plastics;  Laterality: N/A;  . ORIF MANDIBULAR FRACTURE N/A 06/05/2013   Procedure: REPAIR OF MANDIBULAR FRACTURE x 2 with maxillo-mandibular fixation ;  Surgeon: Theodoro Kos, DO;  Location: [place];  Service: Plastics;  Laterality: N/A;  . PARACENTESIS    . PERIPHERAL ARTERIAL STENT GRAFT Left     Prior to Admission medications   Medication Sig Start Date End Date Taking? Authorizing  Provider  aspirin EC 81 MG tablet Take 1 tablet (81 mg total) by mouth daily. 04/20/18 04/20/19 Yes Epifanio Lesches, MD  budesonide-formoterol (SYMBICORT) 160-4.5 MCG/ACT inhaler Inhale 2 puffs into the lungs daily. 11/16/16  Yes Vaughan Basta, MD  calcium acetate (PHOSLO) 667 MG capsule Take 2,001 mg by mouth 3 (three) times daily.   Yes [provider]  clopidogrel (PLAVIX) 75 MG tablet Take 1 tablet (75 mg total) by mouth daily. 04/20/18  04/20/19 Yes Epifanio Lesches, MD  folic acid (FOLVITE) 1 MG tablet Take 1 tablet (1 mg total) by mouth daily. 10/31/17  Yes Fritzi Mandes, MD  gabapentin (NEURONTIN) 300 MG capsule Take 300 mg by mouth 3 (three) times daily.  08/16/16  Yes [provider]  ipratropium-albuterol (DUONEB) 0.5-2.5 (3) MG/3ML SOLN Take 3 mLs by nebulization every 6 (six) hours as needed. 11/28/16  Yes Wieting, Richard, MD  labetalol (NORMODYNE) 100 MG tablet Take 100 mg by mouth daily.   Yes [provider]  multivitamin (RENA-VIT) TABS tablet Take 1 tablet by mouth at bedtime. 10/31/17  Yes Fritzi Mandes, MD  omeprazole (PRILOSEC) 40 MG capsule Take 40 mg by mouth daily. 09/08/17  Yes [provider]  oxyCODONE (OXY IR/ROXICODONE) 5 MG immediate release tablet Take 1 tablet (5 mg total) by mouth every 4 (four) hours as needed for moderate pain. 04/17/18  Yes Epifanio Lesches, MD  tiotropium (SPIRIVA HANDIHALER) 18 MCG inhalation capsule Place 1 capsule (18 mcg total) into inhaler and inhale daily. 01/30/18 05/04/18 Yes Demetrios Loll, MD  vitamin C (VITAMIN C) 250 MG tablet Take 1 tablet (250 mg total) by mouth 2 (two) times daily. 04/20/18  Yes Epifanio Lesches, MD    Family History  Problem Relation Age of Onset  . Colon cancer Mother   . Cancer Father   . Cancer Sister   . Kidney disease Brother      Social History   Tobacco Use  . Smoking status: Current Every Day Smoker    Packs/day: 0.15    Years: 40.00    Pack years: 6.00    Types: Cigarettes  . Smokeless tobacco: Never Used  Substance Use Topics  . Alcohol use: No    Comment: pt reports quitting after learning about cirrhosis  . Drug use: No    Frequency: 7.0 times per week    Types: Marijuana, Cocaine    Allergies as of 05/03/2018  . (No Known Allergies)    Review of Systems:    All systems reviewed and negative except where noted in HPI.   Physical Exam:  Vital signs in last 24 hours: Vitals:   05/04/18 1000  05/04/18 1100 05/04/18 1200 05/04/18 1322  BP: 103/64 101/60 115/68 116/81  Pulse: 83 81  83  Resp: 16   16  Temp:    98.6 F (37 C)  TempSrc:    Oral  SpO2: 94% 100%  100%  Weight:       Last BM Date: 05/03/18 General:   Pleasant, cooperative in NAD Head:  Normocephalic and atraumatic. Eyes:   No icterus.   Conjunctiva pink. PERRLA. Ears:  Normal auditory acuity. Neck:  Supple; no masses or thyroidomegaly Lungs: Respirations even and unlabored. Lungs clear to auscultation bilaterally.   No wheezes, crackles, or rhonchi.  Abdomen:  Soft, nondistended, nontender. Normal bowel sounds. No appreciable masses or hepatomegaly.  No rebound or guarding.  Neurologic:  Alert and oriented x3;  grossly normal neurologically. Skin:  Intact without significant lesions or  rashes. Cervical Nodes:  No significant cervical adenopathy. Psych:  Alert and cooperative. Normal affect.  LAB RESULTS: Recent Labs    05/03/18 2054  WBC 11.7*  HGB 6.8*  HCT 23.2*  PLT 447*   BMET Recent Labs    05/03/18 2054  NA 138  K 7.3*  CL 92*  CO2 29  GLUCOSE 113*  BUN 93*  CREATININE 12.04*  CALCIUM 7.9*   LFT Recent Labs    05/03/18 2054  PROT 6.8  ALBUMIN 2.3*  AST 19  ALT 6  ALKPHOS 100  BILITOT 0.5   PT/INR Recent Labs    05/03/18 2054  LABPROT 14.7  INR 1.16    STUDIES: Dg Chest Portable 1 View  Result Date: 05/03/2018 CLINICAL DATA:  58 y/o M; rectal bleeding. Patient has not been to dialysis for 1 week. Central chest pain with shortness of breath. EXAM: PORTABLE CHEST 1 VIEW COMPARISON:  04/26/2018 chest radiograph. FINDINGS: Stable cardiomegaly given projection and technique. Right central venous catheter is stable in position projecting over lower SVC. Aortic calcific atherosclerosis. Post median sternotomy with wires in alignment. Stable reticular opacities with basilar predominance compatible with interstitial pulmonary edema. No new consolidation, effusion, or pneumothorax.  No acute osseous abnormality is evident. IMPRESSION: Stable cardiomegaly and interstitial pulmonary edema. Aortic Atherosclerosis (ICD10-I70.0). Electronically Signed   By: Kristine Garbe M.D.   On: 05/03/2018 22:36      Impression / Plan:   Stanley [name] is a 58 y.o. y/o male with hematemesis, ESRD, and was admitted after 2 weeks of missing dialysis and found to be hyperkalemic and had chest pain on admission  Patient has not had any further emesis since admission to the hospital The source of his hematemesis at home is likely his known grade D esophagitis just visualized via EGD last week.  Given that his hematemesis has resolved clinically, a repeat EGD given his known esophagitis, is unlikely to add any therapeutic or diagnostic benefit in this case.  In addition, given his severe hyperkalemia, recent chest pain on admission and elevated troponin (which may be his baseline), endoscopic procedures would have higher risks than benefits in this setting.  However, if patient has signs of active GI bleeding, as evidenced by hematemesis, or melena, or no improvement in hemoglobin after transfusion, endoscopy can be considered at that time and patient will need a cardiology consult.  PPI IV twice daily for esophagitis Keep head of bed elevated to 30 degrees at all times Antireflux measures  Continue serial CBCs and transfuse PRN Last known CBC was from last night and repeat is pending at this time.  Please repeat and replace as needed  Avoid NSAIDs Maintain 2 large-bore IV lines Please page GI with any acute hemodynamic changes, or signs of active GI bleeding  Continue to correct electrolytes as appropriate  Dr. Allen Norris will be covering the patient over the weekend, please page him with any questions or concerns.  Thank you for involving me in the care of this patient.     LOS: 0 days   Virgel Manifold, MD  05/04/2018, 2:53 PM

## 2018-05-04 NOTE — Care Management (Signed)
Amanda Morris dialysis liaison notified of admission.    

## 2018-05-04 NOTE — Progress Notes (Signed)
HD Tx end   05/04/18 0500  Vital Signs  Pulse Rate 90  Resp 15  BP 127/61  Oxygen Therapy  SpO2 100 %  During Hemodialysis Assessment  Blood Flow Rate (mL/min) 200 mL/min  Arterial Pressure (mmHg) -180 mmHg  Venous Pressure (mmHg) 200 mmHg  Transmembrane Pressure (mmHg) 60 mmHg  Ultrafiltration Rate (mL/min) 780 mL/min  Dialysate Flow Rate (mL/min) 800 ml/min  Conductivity: Machine  14  HD Safety Checks Performed Yes  KECN 62.4 KECN  Dialysis Fluid Bolus Normal Saline  Bolus Amount (mL) 250 mL  Intra-Hemodialysis Comments Tx completed;Tolerated well  Hemodialysis Catheter Right Internal jugular Double-lumen  No Placement Date or Time found.   Placed prior to admission: Yes  Orientation: Right  Access Location: Internal jugular  Hemodialysis Catheter Type: Double-lumen  Site Condition No complications  Blue Lumen Status Infusing;Flushed;Heparin locked;Saline locked  Red Lumen Status Infusing;Flushed;Saline locked;Capped (Central line);Heparin locked  Purple Lumen Status N/A  Catheter fill solution Heparin 1000 units/ml  Catheter fill volume (Arterial) 1.5 cc  Catheter fill volume (Venous) 1.5  Dressing Type Biopatch  Dressing Status Clean;Dry;Intact;Dressing changed;Antimicrobial disc changed  Interventions New dressing;Dressing changed  Drainage Description None  Post treatment catheter status Capped and Clamped

## 2018-05-04 NOTE — Progress Notes (Signed)
Pre HD Tx   05/04/18 0130  Vital Signs  Temp 98.8 F (37.1 C)  Temp Source Oral  Pulse Rate 91  Pulse Rate Source Monitor  Resp (!) 21  BP (!) 130/93  BP Location Left Arm  BP Method Automatic  Patient Position (if appropriate) Lying  Oxygen Therapy  SpO2 98 %  O2 Device Room Air  Pulse Oximetry Type Continuous  Critical Care Pain Observation Tool (CPOT)  Facial Expression 1  Body Movements 2  Muscle Tension 1  Compliance with ventilator (intubated pts.) N/A  Vocalization (extubated pts.) 1  CPOT Total 5  Dialysis Weight  Weight 78.6 kg  Type of Weight Pre-Dialysis  Education / Care Plan  Dialysis Education Provided Yes  Documented Education in Care Plan Yes  Hemodialysis Catheter Right Internal jugular Double-lumen  No Placement Date or Time found.   Placed prior to admission: Yes  Orientation: Right  Access Location: Internal jugular  Hemodialysis Catheter Type: Double-lumen  Site Condition No complications  Blue Lumen Status Capped (Central line);Blood return noted  Red Lumen Status Capped (Central line);Blood return noted  Purple Lumen Status N/A  Dressing Type Biopatch  Dressing Status Clean;Dry;Intact  Drainage Description None  Post treatment catheter status Capped and Clamped

## 2018-05-04 NOTE — ED Notes (Signed)
HD nurse reports 1.5 liters pulled off pt , HD  Done at this time .

## 2018-05-04 NOTE — Progress Notes (Signed)
Post HD Tx   05/04/18 0515  Vital Signs  Pulse Rate 90  Resp 16  BP 121/66  Oxygen Therapy  SpO2 100 %  Post-Hemodialysis Assessment  Rinseback Volume (mL) 250 mL  KECN 62.4 V  Dialyzer Clearance Lightly streaked  Duration of HD Treatment -hour(s) 3 hour(s)  Hemodialysis Intake (mL) 500 mL  UF Total -Machine (mL) 2000 mL  Net UF (mL) 1500 mL  Tolerated HD Treatment Yes  Post-Hemodialysis Comments  (pt stable, tolerated well. )  Hemodialysis Catheter Right Internal jugular Double-lumen  No Placement Date or Time found.   Placed prior to admission: Yes  Orientation: Right  Access Location: Internal jugular  Hemodialysis Catheter Type: Double-lumen  Site Condition No complications  Blue Lumen Status Capped (Central line);Heparin locked  Red Lumen Status Capped (Central line);Heparin locked  Purple Lumen Status N/A  Catheter fill solution Heparin 1000 units/ml  Post treatment catheter status Capped and Clamped

## 2018-05-04 NOTE — Progress Notes (Signed)
HD Tx Start   05/04/18 0135  Vital Signs  Pulse Rate 90  Resp 20  BP 124/84  Oxygen Therapy  SpO2 98 %  During Hemodialysis Assessment  Blood Flow Rate (mL/min) 200 mL/min  Arterial Pressure (mmHg) -80 mmHg  Venous Pressure (mmHg) 90 mmHg  Transmembrane Pressure (mmHg) 40 mmHg  Ultrafiltration Rate (mL/min) 780 mL/min  Dialysate Flow Rate (mL/min) 800 ml/min  Conductivity: Machine  14  HD Safety Checks Performed Yes  Dialysis Fluid Bolus Normal Saline  Bolus Amount (mL) 250 mL  Intra-Hemodialysis Comments Tx initiated  Hemodialysis Catheter Right Internal jugular Double-lumen  No Placement Date or Time found.   Placed prior to admission: Yes  Orientation: Right  Access Location: Internal jugular  Hemodialysis Catheter Type: Double-lumen  Site Condition No complications  Blue Lumen Status Infusing  Red Lumen Status Infusing  Purple Lumen Status N/A

## 2018-05-04 NOTE — ED Notes (Signed)
ED TO INPATIENT HANDOFF REPORT  ED Nurse Name and Phone #: Anderson [place] [phone]  M Name/Age/Gender Stanley [name] 58 y.o. male Room/Bed: ED17A/ED17A  Code Status   Code Status: Full Code  Home/SNF/Other Home Patient oriented to: self, place, time and situation Is this baseline? Yes   Triage Complete: Triage complete  Chief Complaint chest pain  Triage Note Pt arrived via EMS from home. Per EMS pt was discharged on Monday due to rectal bleeding. Pt has not been to Dialysis x1 week and today since 1500 this evening pt has had central chest pain with SOB. MD at bedside for evaluation.    Allergies No Known Allergies  Level of Care/Admitting Diagnosis ED Disposition    ED Disposition Condition [facility] Area: [place] [[id number]]  Level of Care: Med-Surg [16]  Diagnosis: Hyperkalemia [[id number]]  Admitting Physician: Harrie Foreman [[id number]]  Attending Physician: Harrie Foreman [[id number]]  Estimated length of stay: past midnight tomorrow  Certification:: I certify this patient will need inpatient services for at least 2 midnights  PT Class (Do Not Modify): Inpatient [101]  PT Acc Code (Do Not Modify): Private [1]       B Medical/Surgery History Past Medical History:  Diagnosis Date  . Alcohol abuse   . CHF (congestive heart failure) ([place])   . Cirrhosis ([place])   . Coronary artery disease 2009  . Drug abuse ([place])   . End stage renal disease on dialysis [facility]) NEPHROLOGIST-   DR [facility]  IN [place]   HEMODIALYSIS --   TUES/  THURS/  SAT  . Gastrointestinal bleed 06/13/2017   From chart...hx of multiple GI bleeds  . GERD (gastroesophageal reflux disease)   . Hyperlipidemia   . Hypertension   . PAD (peripheral artery disease) ([place])   . Renal insufficiency    Per pt, 32 oz fluid restriction per day  . S/P triple vessel bypass 06/09/2016   2009ish  . Suicidal ideation    & HOMICIDAL IDEATION --  06-16-2013   ADMITTED TO  BEHAVIOR HEALTH   Past Surgical History:  Procedure Laterality Date  . A/V FISTULAGRAM Right 06/06/2017   Procedure: A/V FISTULAGRAM;  Surgeon: Katha Cabal, MD;  Location: [place] CV LAB;  Service: Cardiovascular;  Laterality: Right;  . A/V SHUNT INTERVENTION N/A 06/06/2017   Procedure: A/V SHUNT INTERVENTION;  Surgeon: Katha Cabal, MD;  Location: [place] CV LAB;  Service: Cardiovascular;  Laterality: N/A;  . AGILE CAPSULE N/A 06/19/2016   Procedure: AGILE CAPSULE;  Surgeon: [name], MD;  Location: [facility] ENDOSCOPY;  Service: Endoscopy;  Laterality: N/A;  . COLONOSCOPY WITH PROPOFOL N/A 06/18/2016   Procedure: COLONOSCOPY WITH PROPOFOL;  Surgeon: [name], MD;  Location: [facility] ENDOSCOPY;  Service: Endoscopy;  Laterality: N/A;  . COLONOSCOPY WITH PROPOFOL N/A 08/12/2016   Procedure: COLONOSCOPY WITH PROPOFOL;  Surgeon: [name], MD;  Location: [facility] ENDOSCOPY;  Service: Endoscopy;  Laterality: N/A;  . COLONOSCOPY WITH PROPOFOL N/A 05/05/2017   Procedure: COLONOSCOPY WITH PROPOFOL;  Surgeon: Manya Silvas, MD;  Location: [facility] ENDOSCOPY;  Service: Endoscopy;  Laterality: N/A;  . CORONARY ANGIOPLASTY  ?   PT UNABLE TO TELL IF  BEFORE OR AFTER  CABG  . CORONARY ARTERY BYPASS GRAFT  2008  ([place] , [place])   3 VESSEL  . DIALYSIS FISTULA CREATION  LAST SURGERY  APPOX  2008  . ENTEROSCOPY N/A 05/10/2016   Procedure: ENTEROSCOPY;  Surgeon: Jerene Bears, MD;  Location: [facility] ENDOSCOPY;  Service: Gastroenterology;  Laterality: N/A;  . ENTEROSCOPY N/A 08/12/2016   Procedure: ENTEROSCOPY;  Surgeon: [name], MD;  Location: [facility] ENDOSCOPY;  Service: Endoscopy;  Laterality: N/A;  . ENTEROSCOPY Left 06/03/2017   Procedure: ENTEROSCOPY;  Surgeon: Virgel Manifold, MD;  Location: [facility] ENDOSCOPY;  Service: Endoscopy;  Laterality: Left;  Procedure date will ultimately depend on when patient is medically optimized before the procedure, pending hemodialysis and blood transfusions etc.  Will place on schedule and change depending on clinical status.   . ENTEROSCOPY N/A 06/05/2017   Procedure: ENTEROSCOPY;  Surgeon: Virgel Manifold, MD;  Location: [facility] ENDOSCOPY;  Service: Endoscopy;  Laterality: N/A;  . ENTEROSCOPY N/A 06/15/2017   Procedure: Push ENTEROSCOPY;  Surgeon: [name], MD;  Location: [facility] ENDOSCOPY;  Service: Endoscopy;  Laterality: N/A;  . ESOPHAGOGASTRODUODENOSCOPY N/A 05/07/2015   Procedure: ESOPHAGOGASTRODUODENOSCOPY (EGD);  Surgeon: Hulen Luster, MD;  Location: [facility] ENDOSCOPY;  Service: Endoscopy;  Laterality: N/A;  . ESOPHAGOGASTRODUODENOSCOPY (EGD) WITH PROPOFOL N/A 05/17/2015   Procedure: ESOPHAGOGASTRODUODENOSCOPY (EGD) WITH PROPOFOL;  Surgeon: [name], MD;  Location: [facility] ENDOSCOPY;  Service: Endoscopy;  Laterality: N/A;  . ESOPHAGOGASTRODUODENOSCOPY (EGD) WITH PROPOFOL N/A 01/20/2016   Procedure: ESOPHAGOGASTRODUODENOSCOPY (EGD) WITH PROPOFOL;  Surgeon: [name], MD;  Location: [facility] ENDOSCOPY;  Service: Endoscopy;  Laterality: N/A;  . ESOPHAGOGASTRODUODENOSCOPY (EGD) WITH PROPOFOL N/A 04/17/2016   Procedure: ESOPHAGOGASTRODUODENOSCOPY (EGD) WITH PROPOFOL;  Surgeon: Lin Landsman, MD;  Location: [facility] ENDOSCOPY;  Service: Gastroenterology;  Laterality: N/A;  . ESOPHAGOGASTRODUODENOSCOPY (EGD) WITH PROPOFOL  05/09/2016   Procedure: ESOPHAGOGASTRODUODENOSCOPY (EGD) WITH PROPOFOL;  Surgeon: Jerene Bears, MD;  Location: [place];  Service: Endoscopy;;  . ESOPHAGOGASTRODUODENOSCOPY (EGD) WITH PROPOFOL N/A 06/16/2016   Procedure: ESOPHAGOGASTRODUODENOSCOPY (EGD) WITH PROPOFOL;  Surgeon: [name], MD;  Location: [facility] ENDOSCOPY;  Service: Endoscopy;  Laterality: N/A;  . ESOPHAGOGASTRODUODENOSCOPY (EGD) WITH PROPOFOL N/A 05/05/2017   Procedure: ESOPHAGOGASTRODUODENOSCOPY (EGD) WITH PROPOFOL;  Surgeon: Manya Silvas, MD;  Location: [facility] ENDOSCOPY;  Service: Endoscopy;  Laterality: N/A;  . ESOPHAGOGASTRODUODENOSCOPY (EGD) WITH PROPOFOL N/A 06/15/2017    Procedure: ESOPHAGOGASTRODUODENOSCOPY (EGD) WITH PROPOFOL;  Surgeon: [name], MD;  Location: [facility] ENDOSCOPY;  Service: Endoscopy;  Laterality: N/A;  . ESOPHAGOGASTRODUODENOSCOPY (EGD) WITH PROPOFOL N/A 04/27/2018   Procedure: ESOPHAGOGASTRODUODENOSCOPY (EGD) WITH PROPOFOL;  Surgeon: [name], MD;  Location: [facility] ENDOSCOPY;  Service: Endoscopy;  Laterality: N/A;  . GIVENS CAPSULE STUDY N/A 05/07/2016   Procedure: GIVENS CAPSULE STUDY;  Surgeon: Doran Stabler, MD;  Location: [place];  Service: Endoscopy;  Laterality: N/A;  . LOWER EXTREMITY ANGIOGRAPHY Left 04/19/2018   Procedure: LOWER EXTREMITY ANGIOGRAPHY;  Surgeon: Algernon Huxley, MD;  Location: [place] CV LAB;  Service: Cardiovascular;  Laterality: Left;  Marland Kitchen MANDIBULAR HARDWARE REMOVAL N/A 07/29/2013   Procedure: REMOVAL OF ARCH BARS;  Surgeon: Theodoro Kos, DO;  Location: [place];  Service: Plastics;  Laterality: N/A;  . ORIF MANDIBULAR FRACTURE N/A 06/05/2013   Procedure: REPAIR OF MANDIBULAR FRACTURE x 2 with maxillo-mandibular fixation ;  Surgeon: Theodoro Kos, DO;  Location: [place];  Service: Plastics;  Laterality: N/A;  . PARACENTESIS    . PERIPHERAL ARTERIAL STENT GRAFT Left      A IV Location/Drains/Wounds Patient Lines/Drains/Airways Status   Active Line/Drains/Airways    Name:   Placement date:   Placement time:   Site:   Days:   Peripheral IV 05/03/18 Left Wrist   05/03/18    2057    Wrist  1   Peripheral IV 05/03/18 Left;Anterior Forearm   05/03/18    2137    Forearm   1   Fistula / Graft Right Upper arm Arteriovenous fistula   -    -    Upper arm      Hemodialysis Catheter Right Internal jugular Double-lumen   -    -    Internal jugular      Airway   04/19/18    1443     15   Airway   04/27/18    1002     7          Intake/Output Last 24 hours  Intake/Output Summary (Last 24 hours) at 05/04/2018 1214 Last data filed at 05/04/2018 0515 Gross per 24 hour  Intake 150 ml  Output 1500  ml  Net -1350 ml    Labs/Imaging Results for orders placed or performed during the hospital encounter of 05/03/18 (from the past 48 hour(s))  Glucose, capillary     Status: None   Collection Time: 05/03/18  8:53 PM  Result Value Ref Range   Glucose-Capillary 99 70 - 99 mg/dL  CBC     Status: Abnormal   Collection Time: 05/03/18  8:54 PM  Result Value Ref Range   WBC 11.7 (H) 4.0 - 10.5 K/uL   RBC 2.76 (L) 4.22 - 5.81 MIL/uL   Hemoglobin 6.8 (L) 13.0 - 17.0 g/dL   HCT 23.2 (L) 39.0 - 52.0 %   MCV 84.1 80.0 - 100.0 fL   MCH 24.6 (L) 26.0 - 34.0 pg   MCHC 29.3 (L) 30.0 - 36.0 g/dL   RDW 19.7 (H) 11.5 - 15.5 %   Platelets 447 (H) 150 - 400 K/uL   nRBC 0.0 0.0 - 0.2 %    Comment: Performed at [facility], [address] [place], [place] [address]  Troponin I - ONCE - STAT     Status: Abnormal   Collection Time: 05/03/18  8:54 PM  Result Value Ref Range   Troponin I 0.04 (HH) <0.03 ng/mL    Comment: CRITICAL RESULT CALLED TO, READ BACK BY AND VERIFIED WITH BUTCH WOODS 05/03/18 @ 2146  [place] Performed at [facility], [address] [place], [place] [address]   Comprehensive metabolic panel     Status: Abnormal   Collection Time: 05/03/18  8:54 PM  Result Value Ref Range   Sodium 138 135 - 145 mmol/L   Potassium 7.3 (HH) 3.5 - 5.1 mmol/L    Comment: CRITICAL RESULT CALLED TO, READ BACK BY AND VERIFIED WITH BUTCH WOODS 05/03/18 @ 2146  [place] RESULTS CONFIRMED BY MANUAL DILUTION CORRECTED ON 02/20 AT 2146: PREVIOUSLY REPORTED AS 7.3 CRITICAL RESULT CALLED TO, READ BACK BY AND VERIFIED WITH BUTCH WOODS 05/03/18 @ 2146  MLK    Chloride 92 (L) 98 - 111 mmol/L   CO2 29 22 - 32 mmol/L   Glucose, Bld 113 (H) 70 - 99 mg/dL   BUN 93 (H) 6 - 20 mg/dL   Creatinine, Ser 12.04 (H) 0.61 - 1.24 mg/dL   Calcium 7.9 (L) 8.9 - 10.3 mg/dL   Total Protein 6.8 6.5 - 8.1 g/dL   Albumin 2.3 (L) 3.5 - 5.0 g/dL   AST 19 15 - 41 U/L   ALT 6 0 - 44 U/L   Alkaline Phosphatase 100 38 -  126 U/L   Total Bilirubin 0.5 0.3 - 1.2 mg/dL   GFR calc non Af Amer 4 (L) >60  mL/min   GFR calc Af Amer 5 (L) >60 mL/min   Anion gap 17 (H) 5 - 15    Comment: Performed at [facility], [address] [place], [place] [address]  Protime-INR     Status: None   Collection Time: 05/03/18  8:54 PM  Result Value Ref Range   Prothrombin Time 14.7 11.4 - 15.2 seconds   INR 1.16     Comment: Performed at [facility], [address] [place], [place] [address]  APTT     Status: Abnormal   Collection Time: 05/03/18  8:54 PM  Result Value Ref Range   aPTT 42 (H) 24 - 36 seconds    Comment:        IF BASELINE aPTT IS ELEVATED, SUGGEST PATIENT RISK ASSESSMENT BE USED TO DETERMINE APPROPRIATE ANTICOAGULANT THERAPY. Performed at [facility], [address] [place], [place] [address]   Brain natriuretic peptide     Status: Abnormal   Collection Time: 05/03/18  8:54 PM  Result Value Ref Range   B Natriuretic Peptide 1,809.0 (H) 0.0 - 100.0 pg/mL    Comment: Performed at [facility], [address] [place], [place] [address]  Prepare RBC     Status: None   Collection Time: 05/03/18  9:26 PM  Result Value Ref Range   Order Confirmation      ORDER PROCESSED BY BLOOD BANK Performed at [facility], [address] [place], [place] [address]   Type and screen Ordered by PROVIDER DEFAULT     Status: None   Collection Time: 05/03/18  9:26 PM  Result Value Ref Range   ABO/RH(D) A POS    Antibody Screen NEG    Sample Expiration 05/06/2018    Unit Number [id number]    Blood Component Type RED CELLS,LR    Unit division 00    Status of Unit ISSUED,FINAL    Transfusion Status OK TO TRANSFUSE    Crossmatch Result      Compatible Performed at [facility], [address] [place], [place] [address]   Phosphorus     Status: None   Collection Time: 05/04/18  2:23 AM  Result Value Ref Range   Phosphorus 4.4 2.5 - 4.6 mg/dL     Comment: Performed at [facility], [address] [place], [place] [address]   Dg Chest Portable 1 View  Result Date: 05/03/2018 CLINICAL DATA:  58 y/o M; rectal bleeding. Patient has not been to dialysis for 1 week. Central chest pain with shortness of breath. EXAM: PORTABLE CHEST 1 VIEW COMPARISON:  04/26/2018 chest radiograph. FINDINGS: Stable cardiomegaly given projection and technique. Right central venous catheter is stable in position projecting over lower SVC. Aortic calcific atherosclerosis. Post median sternotomy with wires in alignment. Stable reticular opacities with basilar predominance compatible with interstitial pulmonary edema. No new consolidation, effusion, or pneumothorax. No acute osseous abnormality is evident. IMPRESSION: Stable cardiomegaly and interstitial pulmonary edema. Aortic Atherosclerosis (ICD10-I70.0). Electronically Signed   By: Kristine Garbe M.D.   On: 05/03/2018 22:36    Pending Labs Unresulted Labs (From admission, onward)   None      Vitals/Pain Today's Vitals   05/04/18 0732 05/04/18 0800 05/04/18 1000 05/04/18 1100  BP: 123/65 111/66 103/64 101/60  Pulse:  84 83 81  Resp: 13 17 16    Temp:      TempSrc:      SpO2: 97% 94% 94% 100%  Weight:  PainSc:        Isolation Precautions No active isolations  Medications Medications  nitroGLYCERIN (NITROSTAT) SL tablet 0.4 mg (0.4 mg Sublingual Given 05/03/18 2221)  pantoprazole (PROTONIX) 80 mg in sodium chloride 0.9 % 250 mL (0.32 mg/mL) infusion (8 mg/hr Intravenous New Bag/Given 05/04/18 1056)  pantoprazole (PROTONIX) injection 40 mg (has no administration in time range)  Chlorhexidine Gluconate Cloth 2 % PADS 6 each (has no administration in time range)  heparin injection 5,000 Units (5,000 Units Subcutaneous Not Given 05/04/18 0825)  acetaminophen (TYLENOL) tablet 650 mg (has no administration in time range)    Or  acetaminophen (TYLENOL) suppository 650 mg (has no  administration in time range)  ondansetron (ZOFRAN) tablet 4 mg (has no administration in time range)    Or  ondansetron (ZOFRAN) injection 4 mg (has no administration in time range)  docusate sodium (COLACE) capsule 100 mg (100 mg Oral Not Given 05/04/18 1053)  insulin aspart (novoLOG) injection 10 Units (10 Units Intravenous Given 05/03/18 2101)  dextrose 50 % solution 50 mL (50 mLs Intravenous Given 05/03/18 2105)  albuterol (PROVENTIL) (2.5 MG/3ML) 0.083% nebulizer solution 2.5 mg (2.5 mg Nebulization Given 05/03/18 2102)  calcium gluconate 1 g/ 50 mL sodium chloride IVPB (0 g Intravenous Stopped 05/03/18 2154)  0.9 %  sodium chloride infusion (0 mL/hr Intravenous Stopped 05/04/18 0947)  pantoprazole (PROTONIX) 80 mg in sodium chloride 0.9 % 100 mL IVPB (0 mg Intravenous Stopped 05/04/18 0005)  ondansetron (ZOFRAN) injection 4 mg (4 mg Intravenous Given 05/03/18 2320)  calcium gluconate inj 10% (1 g) URGENT USE ONLY! (1 g Intravenous Given 05/04/18 0010)    Mobility walks Moderate fall risk   Focused Assessments Renal Assessment Handoff:  Hemodialysis Schedule: pt has not scheduled dialysis Last Hemodialysis date and time: last night   Restricted appendage: right arm     R Recommendations: See Admitting Provider Note  Report given to:   Additional Notes:

## 2018-05-04 NOTE — Progress Notes (Signed)
Post HD Tx Assessment  Pt tolerated HD tx well. 1573mL net removal, at 400 blood flow rate and 800 dialysate flow rate throughout tx. Pt reports "feeling better" post tx. O2 admin at 2L [place] required during tx d/t sats declining to 88. 100 on 2L [place].  Vitals stable. No bloody emesis or stool reported pre, intra, post HD tx.    05/04/18 0505  Neurological  Level of Consciousness Alert  Orientation Level Oriented X4;Other (comment) (overall, more interactive. speech more clear)  Respiratory  Respiratory Pattern Regular  Bilateral Breath Sounds Diminished  Cardiac  Pulse Regular  Heart Sounds S1, S2  ECG Monitor Yes  Cardiac Rhythm NSR  Vascular  R Radial Pulse +2  L Radial Pulse +2  Edema Generalized;Periorbital;Facial  Integumentary  Integumentary (WDL) X  Skin Color Appropriate for ethnicity  Skin Condition Dry  Skin Integrity Catheter entry/exit site (HD cath)  Musculoskeletal  Musculoskeletal (WDL) X  Generalized Weakness Yes  GU Assessment  Genitourinary (WDL) X  Genitourinary Symptoms  (HD pt)  Psychosocial  Psychosocial (WDL) X  Patient Behaviors Appropriate for situation;Restless  Needs Expressed Physical  Emotional support given Given to patient

## 2018-05-04 NOTE — H&P (Addendum)
Stanley [name] is an 58 y.o. male.   Chief Complaint: Chest pain HPI: The patient with past medical history of cirrhosis, end-stage renal disease on dialysis, hypertension, CAD and CHF presents to the emergency department complaining of chest pain.  The patient is somnolent and does not contribute much to his history for me but admits to some difficulty breathing.  The patient admitted to missing dialysis for 2 weeks.  Potassium was found to be greater than 7 and the patient had EKG changes concerning for electrolyte due to arrhythmia.  He was started on dialysis.  The patient also reported hematemesis and dark-colored stools.  He was found to be more anemic than the last admission for the same.  1 unit packed red blood cells were transfused and once he was determined to be hemodynamically stable the emergency department staff called with the hospitalist service for admission.  Past Medical History:  Diagnosis Date  . Alcohol abuse   . CHF (congestive heart failure) ([place])   . Cirrhosis ([place])   . Coronary artery disease 2009  . Drug abuse ([place])   . End stage renal disease on dialysis [facility]) NEPHROLOGIST-   DR [facility]  IN [place]   HEMODIALYSIS --   TUES/  THURS/  SAT  . Gastrointestinal bleed 06/13/2017   From chart...hx of multiple GI bleeds  . GERD (gastroesophageal reflux disease)   . Hyperlipidemia   . Hypertension   . PAD (peripheral artery disease) ([place])   . Renal insufficiency    Per pt, 32 oz fluid restriction per day  . S/P triple vessel bypass 06/09/2016   2009ish  . Suicidal ideation    & HOMICIDAL IDEATION --  06-16-2013   ADMITTED TO BEHAVIOR HEALTH    Past Surgical History:  Procedure Laterality Date  . A/V FISTULAGRAM Right 06/06/2017   Procedure: A/V FISTULAGRAM;  Surgeon: Katha Cabal, MD;  Location: [place] [facility];  Service: Cardiovascular;  Laterality: Right;  . A/V SHUNT INTERVENTION N/A 06/06/2017   Procedure: A/V SHUNT INTERVENTION;  Surgeon:  Katha Cabal, MD;  Location: [place] [facility];  Service: Cardiovascular;  Laterality: N/A;  . AGILE CAPSULE N/A 06/19/2016   Procedure: AGILE CAPSULE;  Surgeon: [name], MD;  Location: [facility] ENDOSCOPY;  Service: Endoscopy;  Laterality: N/A;  . COLONOSCOPY WITH PROPOFOL N/A 06/18/2016   Procedure: COLONOSCOPY WITH PROPOFOL;  Surgeon: [name], MD;  Location: [facility] ENDOSCOPY;  Service: Endoscopy;  Laterality: N/A;  . COLONOSCOPY WITH PROPOFOL N/A 08/12/2016   Procedure: COLONOSCOPY WITH PROPOFOL;  Surgeon: [name], MD;  Location: [facility] ENDOSCOPY;  Service: Endoscopy;  Laterality: N/A;  . COLONOSCOPY WITH PROPOFOL N/A 05/05/2017   Procedure: COLONOSCOPY WITH PROPOFOL;  Surgeon: Manya Silvas, MD;  Location: [facility] ENDOSCOPY;  Service: Endoscopy;  Laterality: N/A;  . CORONARY ANGIOPLASTY  ?   PT UNABLE TO TELL IF  BEFORE OR AFTER  CABG  . CORONARY ARTERY BYPASS GRAFT  2008  ([place] , [place])   3 VESSEL  . DIALYSIS FISTULA CREATION  LAST SURGERY  APPOX  2008  . ENTEROSCOPY N/A 05/10/2016   Procedure: ENTEROSCOPY;  Surgeon: Jerene Bears, MD;  Location: [place];  Service: Gastroenterology;  Laterality: N/A;  . ENTEROSCOPY N/A 08/12/2016   Procedure: ENTEROSCOPY;  Surgeon: [name], MD;  Location: [facility] ENDOSCOPY;  Service: Endoscopy;  Laterality: N/A;  . ENTEROSCOPY Left 06/03/2017   Procedure: ENTEROSCOPY;  Surgeon: Virgel Manifold, MD;  Location: [facility] ENDOSCOPY;  Service: Endoscopy;  Laterality: Left;  Procedure date will ultimately depend on when patient is medically optimized before the procedure, pending hemodialysis and blood transfusions etc. Will place on schedule and change depending on clinical status.   . ENTEROSCOPY N/A 06/05/2017   Procedure: ENTEROSCOPY;  Surgeon: Virgel Manifold, MD;  Location: [facility] ENDOSCOPY;  Service: Endoscopy;  Laterality: N/A;  . ENTEROSCOPY N/A 06/15/2017   Procedure: Push ENTEROSCOPY;  Surgeon: [name], MD;  Location: [facility] ENDOSCOPY;   Service: Endoscopy;  Laterality: N/A;  . ESOPHAGOGASTRODUODENOSCOPY N/A 05/07/2015   Procedure: ESOPHAGOGASTRODUODENOSCOPY (EGD);  Surgeon: Hulen Luster, MD;  Location: [facility] ENDOSCOPY;  Service: Endoscopy;  Laterality: N/A;  . ESOPHAGOGASTRODUODENOSCOPY (EGD) WITH PROPOFOL N/A 05/17/2015   Procedure: ESOPHAGOGASTRODUODENOSCOPY (EGD) WITH PROPOFOL;  Surgeon: [name], MD;  Location: [facility] ENDOSCOPY;  Service: Endoscopy;  Laterality: N/A;  . ESOPHAGOGASTRODUODENOSCOPY (EGD) WITH PROPOFOL N/A 01/20/2016   Procedure: ESOPHAGOGASTRODUODENOSCOPY (EGD) WITH PROPOFOL;  Surgeon: [name], MD;  Location: [facility] ENDOSCOPY;  Service: Endoscopy;  Laterality: N/A;  . ESOPHAGOGASTRODUODENOSCOPY (EGD) WITH PROPOFOL N/A 04/17/2016   Procedure: ESOPHAGOGASTRODUODENOSCOPY (EGD) WITH PROPOFOL;  Surgeon: Lin Landsman, MD;  Location: [facility] ENDOSCOPY;  Service: Gastroenterology;  Laterality: N/A;  . ESOPHAGOGASTRODUODENOSCOPY (EGD) WITH PROPOFOL  05/09/2016   Procedure: ESOPHAGOGASTRODUODENOSCOPY (EGD) WITH PROPOFOL;  Surgeon: Jerene Bears, MD;  Location: [place];  Service: Endoscopy;;  . ESOPHAGOGASTRODUODENOSCOPY (EGD) WITH PROPOFOL N/A 06/16/2016   Procedure: ESOPHAGOGASTRODUODENOSCOPY (EGD) WITH PROPOFOL;  Surgeon: [name], MD;  Location: [facility] ENDOSCOPY;  Service: Endoscopy;  Laterality: N/A;  . ESOPHAGOGASTRODUODENOSCOPY (EGD) WITH PROPOFOL N/A 05/05/2017   Procedure: ESOPHAGOGASTRODUODENOSCOPY (EGD) WITH PROPOFOL;  Surgeon: Manya Silvas, MD;  Location: [facility] ENDOSCOPY;  Service: Endoscopy;  Laterality: N/A;  . ESOPHAGOGASTRODUODENOSCOPY (EGD) WITH PROPOFOL N/A 06/15/2017   Procedure: ESOPHAGOGASTRODUODENOSCOPY (EGD) WITH PROPOFOL;  Surgeon: [name], MD;  Location: [facility] ENDOSCOPY;  Service: Endoscopy;  Laterality: N/A;  . ESOPHAGOGASTRODUODENOSCOPY (EGD) WITH PROPOFOL N/A 04/27/2018   Procedure: ESOPHAGOGASTRODUODENOSCOPY (EGD) WITH PROPOFOL;  Surgeon: [name], MD;  Location: [facility] ENDOSCOPY;  Service:  Endoscopy;  Laterality: N/A;  . GIVENS CAPSULE STUDY N/A 05/07/2016   Procedure: GIVENS CAPSULE STUDY;  Surgeon: Doran Stabler, MD;  Location: [place];  Service: Endoscopy;  Laterality: N/A;  . LOWER EXTREMITY ANGIOGRAPHY Left 04/19/2018   Procedure: LOWER EXTREMITY ANGIOGRAPHY;  Surgeon: Algernon Huxley, MD;  Location: [place] [facility];  Service: Cardiovascular;  Laterality: Left;  Marland Kitchen MANDIBULAR HARDWARE REMOVAL N/A 07/29/2013   Procedure: REMOVAL OF ARCH BARS;  Surgeon: Theodoro Kos, DO;  Location: [place];  Service: Plastics;  Laterality: N/A;  . ORIF MANDIBULAR FRACTURE N/A 06/05/2013   Procedure: REPAIR OF MANDIBULAR FRACTURE x 2 with maxillo-mandibular fixation ;  Surgeon: Theodoro Kos, DO;  Location: [place];  Service: Plastics;  Laterality: N/A;  . PARACENTESIS    . PERIPHERAL ARTERIAL STENT GRAFT Left     Family History  Problem Relation Age of Onset  . Colon cancer Mother   . Cancer Father   . Cancer Sister   . Kidney disease Brother    Social History:  reports that he has been smoking cigarettes. He has a 6.00 pack-year smoking history. He has never used smokeless tobacco. He reports that he does not drink alcohol or use drugs.  Allergies: No Known Allergies  Prior to Admission medications   Medication Sig Start Date End Date Taking? Authorizing Provider  aspirin EC 81 MG tablet Take 1 tablet (81 mg total) by mouth daily. 04/20/18 04/20/19 Yes Epifanio Lesches, MD  folic acid (FOLVITE) 1 MG tablet Take 1 tablet (1 mg total) by mouth daily. 10/31/17  Yes Fritzi Mandes, MD  oxyCODONE (OXY IR/ROXICODONE) 5 MG immediate release tablet Take 1 tablet (5 mg total) by mouth every 4 (four) hours as needed for moderate pain. 04/17/18  Yes Epifanio Lesches, MD  vitamin C (VITAMIN C) 250 MG tablet Take 1 tablet (250 mg total) by mouth 2 (two) times daily. 04/20/18  Yes Epifanio Lesches, MD  budesonide-formoterol (SYMBICORT) 160-4.5 MCG/ACT inhaler Inhale 2 puffs  into the lungs daily. Patient not taking: Reported on 05/03/2018 11/16/16   Vaughan Basta, MD  calcium acetate (PHOSLO) 667 MG capsule Take 2,001 mg by mouth 3 (three) times daily.    [provider]  clopidogrel (PLAVIX) 75 MG tablet Take 1 tablet (75 mg total) by mouth daily. Patient not taking: Reported on 05/03/2018 04/20/18 04/20/19  Epifanio Lesches, MD  gabapentin (NEURONTIN) 300 MG capsule Take 300 mg by mouth 3 (three) times daily.  08/16/16   [provider]  ipratropium-albuterol (DUONEB) 0.5-2.5 (3) MG/3ML SOLN Take 3 mLs by nebulization every 6 (six) hours as needed. Patient not taking: Reported on 05/03/2018 11/28/16   Loletha Grayer, MD  labetalol (NORMODYNE) 100 MG tablet Take 100 mg by mouth daily.    [provider]  multivitamin (RENA-VIT) TABS tablet Take 1 tablet by mouth at bedtime. Patient not taking: Reported on 05/03/2018 10/31/17   Fritzi Mandes, MD  omeprazole (PRILOSEC) 40 MG capsule Take 40 mg by mouth daily. 09/08/17   [provider]  tiotropium (SPIRIVA HANDIHALER) 18 MCG inhalation capsule Place 1 capsule (18 mcg total) into inhaler and inhale daily. 01/30/18 04/30/18  Demetrios Loll, MD     Results for orders placed or performed during the hospital encounter of 05/03/18 (from the past 48 hour(s))  Glucose, capillary     Status: None   Collection Time: 05/03/18  8:53 PM  Result Value Ref Range   Glucose-Capillary 99 70 - 99 mg/dL  CBC     Status: Abnormal   Collection Time: 05/03/18  8:54 PM  Result Value Ref Range   WBC 11.7 (H) 4.0 - 10.5 K/uL   RBC 2.76 (L) 4.22 - 5.81 MIL/uL   Hemoglobin 6.8 (L) 13.0 - 17.0 g/dL   HCT 23.2 (L) 39.0 - 52.0 %   MCV 84.1 80.0 - 100.0 fL   MCH 24.6 (L) 26.0 - 34.0 pg   MCHC 29.3 (L) 30.0 - 36.0 g/dL   RDW 19.7 (H) 11.5 - 15.5 %   Platelets 447 (H) 150 - 400 K/uL   nRBC 0.0 0.0 - 0.2 %    Comment: Performed at [facility], [address] [place], [place] [address]  Troponin I  - ONCE - STAT     Status: Abnormal   Collection Time: 05/03/18  8:54 PM  Result Value Ref Range   Troponin I 0.04 (HH) <0.03 ng/mL    Comment: CRITICAL RESULT CALLED TO, READ BACK BY AND VERIFIED WITH BUTCH WOODS 05/03/18 @ 2146  [facility] Performed at [facility], [address] [place], [place] [address]   Comprehensive metabolic panel     Status: Abnormal   Collection Time: 05/03/18  8:54 PM  Result Value Ref Range   Sodium 138 135 - 145 mmol/L   Potassium 7.3 (HH) 3.5 - 5.1 mmol/L    Comment: CRITICAL RESULT CALLED TO, READ BACK BY AND VERIFIED WITH BUTCH WOODS 05/03/18 @ 2146  [place] RESULTS CONFIRMED BY  MANUAL DILUTION CORRECTED ON 02/20 AT 2146: PREVIOUSLY REPORTED AS 7.3 CRITICAL RESULT CALLED TO, READ BACK BY AND VERIFIED WITH BUTCH WOODS 05/03/18 @ 2146  MLK    Chloride 92 (L) 98 - 111 mmol/L   CO2 29 22 - 32 mmol/L   Glucose, Bld 113 (H) 70 - 99 mg/dL   BUN 93 (H) 6 - 20 mg/dL   Creatinine, Ser 12.04 (H) 0.61 - 1.24 mg/dL   Calcium 7.9 (L) 8.9 - 10.3 mg/dL   Total Protein 6.8 6.5 - 8.1 g/dL   Albumin 2.3 (L) 3.5 - 5.0 g/dL   AST 19 15 - 41 U/L   ALT 6 0 - 44 U/L   Alkaline Phosphatase 100 38 - 126 U/L   Total Bilirubin 0.5 0.3 - 1.2 mg/dL   GFR calc non Af Amer 4 (L) >60 mL/min   GFR calc Af Amer 5 (L) >60 mL/min   Anion gap 17 (H) 5 - 15    Comment: Performed at [facility], [address] [place], [place] [address]  Protime-INR     Status: None   Collection Time: 05/03/18  8:54 PM  Result Value Ref Range   Prothrombin Time 14.7 11.4 - 15.2 seconds   INR 1.16     Comment: Performed at [facility], [address] [place], [place] [address]  APTT     Status: Abnormal   Collection Time: 05/03/18  8:54 PM  Result Value Ref Range   aPTT 42 (H) 24 - 36 seconds    Comment:        IF BASELINE aPTT IS ELEVATED, SUGGEST PATIENT RISK ASSESSMENT BE USED TO DETERMINE APPROPRIATE ANTICOAGULANT THERAPY. Performed at [facility], [address] [place], [place] [address]   Type and screen [place]     Status: None (Preliminary result)   Collection Time: 05/03/18  8:54 PM  Result Value Ref Range   ABO/RH(D) PENDING    Antibody Screen PENDING    Sample Expiration      05/06/2018 Performed at [facility], [address] [place], [place] [address]   Brain natriuretic peptide     Status: Abnormal   Collection Time: 05/03/18  8:54 PM  Result Value Ref Range   B Natriuretic Peptide 1,809.0 (H) 0.0 - 100.0 pg/mL    Comment: Performed at [facility], [address] [place], [place] [address]  Prepare RBC     Status: None   Collection Time: 05/03/18  9:26 PM  Result Value Ref Range   Order Confirmation      ORDER PROCESSED BY BLOOD BANK Performed at [facility], [address] [place], [place] [address]   Type and screen Ordered by PROVIDER DEFAULT     Status: None (Preliminary result)   Collection Time: 05/03/18  9:26 PM  Result Value Ref Range   ABO/RH(D) A POS    Antibody Screen NEG    Sample Expiration 05/06/2018    Unit Number [id number]    Blood Component Type RED CELLS,LR    Unit division 00    Status of Unit ISSUED    Transfusion Status OK TO TRANSFUSE    Crossmatch Result      Compatible Performed at [facility], [address], [place], [place] [address]   Phosphorus     Status: None   Collection Time: 05/04/18  2:23 AM  Result Value Ref Range   Phosphorus 4.4 2.5 - 4.6 mg/dL  Comment: Performed at [facility], [address], [place],  [address]   Dg Chest Portable 1 View  Result Date: 05/03/2018 CLINICAL DATA:  58 y/o M; rectal bleeding. Patient has not been to dialysis for 1 week. Central chest pain with shortness of breath. EXAM: PORTABLE CHEST 1 VIEW COMPARISON:  04/26/2018 chest radiograph. FINDINGS: Stable cardiomegaly given projection and technique. Right central venous catheter is stable in  position projecting over lower SVC. Aortic calcific atherosclerosis. Post median sternotomy with wires in alignment. Stable reticular opacities with basilar predominance compatible with interstitial pulmonary edema. No new consolidation, effusion, or pneumothorax. No acute osseous abnormality is evident. IMPRESSION: Stable cardiomegaly and interstitial pulmonary edema. Aortic Atherosclerosis (ICD10-I70.0). Electronically Signed   By: Kristine Garbe M.D.   On: 05/03/2018 22:36    Review of Systems  Constitutional: Negative for chills and fever.  HENT: Negative for sore throat and tinnitus.   Eyes: Negative for blurred vision and redness.  Respiratory: Negative for cough and shortness of breath.   Cardiovascular: Positive for chest pain. Negative for palpitations, orthopnea and PND.  Gastrointestinal: Negative for abdominal pain, diarrhea, nausea and vomiting.  Genitourinary: Negative for dysuria, frequency and urgency.  Musculoskeletal: Negative for joint pain and myalgias.  Skin: Negative for rash.       No lesions  Neurological: Negative for speech change, focal weakness and weakness.  Endo/Heme/Allergies: Does not bruise/bleed easily.       No temperature intolerance  Psychiatric/Behavioral: Negative for depression and suicidal ideas.    Blood pressure 126/67, pulse 87, temperature 98.8 F (37.1 C), temperature source Oral, resp. rate 15, weight 78.6 kg, SpO2 97 %. Physical Exam  Vitals reviewed. Constitutional: He is oriented to person, place, and time. He appears well-developed and well-nourished. No distress.  HENT:  Head: Normocephalic and atraumatic.  Mouth/Throat: Oropharynx is clear and moist.  Eyes: Pupils are equal, round, and reactive to light. Conjunctivae and EOM are normal. No scleral icterus.  Neck: Normal range of motion. Neck supple. No JVD present. No tracheal deviation present. No thyromegaly present.  Cardiovascular: Normal rate, regular rhythm and normal  heart sounds. Exam reveals no gallop and no friction rub.  No murmur heard. Respiratory: Effort normal and breath sounds normal. No respiratory distress.  Permacath in place right chest wall  GI: Soft. Bowel sounds are normal. He exhibits no distension. There is no abdominal tenderness.  Genitourinary:    Genitourinary Comments: Deferred   Musculoskeletal: Normal range of motion.        General: No edema.  Lymphadenopathy:    He has no cervical adenopathy.  Neurological: He is alert and oriented to person, place, and time. No cranial nerve deficit.  Skin: Skin is warm and dry. No rash noted. No erythema.  Psychiatric: He has a normal mood and affect. His behavior is normal. Judgment and thought content normal.     Assessment/Plan This is a 58 year old male admitted for hematemesis. 1.  Hematemesis: Recurrent bleeding.  History of cirrhosis and varices but also history of posterior oropharyngeal bleeding.  Consult GI for further guidance.  The patient is anemic but not tachycardic.  Blood pressure had been normal but has become softer as the patient is now sleeping. 2.  Hyperkalemia: With EKG changes.  The patient has completed dialysis here in the emergency department.  Nephrology to follow. 3.  ESRD: On hemodialysis; dose fluids and drugs accordingly.  Continue PhosLo 4.  CHF: Chronic; diastolic.  Continue labetalol 5.  CAD: Stable; continue  Plavix and aspirin 6.  DVT prophylaxis: SCDs 7.  GI prophylaxis: Pantoprazole The patient is a full code.  Time spent on admission orders and patient care approximately 45 minutes  Harrie Foreman, MD 05/04/2018, 4:34 AM

## 2018-05-04 NOTE — Progress Notes (Signed)
Patient admitted early this morning.  Seen and examined by me later in the morning.  No additional episodes of hematemesis.  Denies any nausea or abdominal pain.  Received HD last night.  On exam, sleepy but easily arousable.  RRR, lungs clear to auscultation bilaterally. +BS, abdomen soft, nontender, nondistended.   -Received 1 unit PRBC in the ED.  Will obtain posttransfusion H/H. -GI consult pending -Received HD yesterday, plan for HD tomorrow -Nephrology following  Hyman Bible, MD

## 2018-05-05 DIAGNOSIS — K921 Melena: Secondary | ICD-10-CM

## 2018-05-05 LAB — RENAL FUNCTION PANEL
ANION GAP: 11 (ref 5–15)
Albumin: 1.9 g/dL — ABNORMAL LOW (ref 3.5–5.0)
BUN: 70 mg/dL — ABNORMAL HIGH (ref 6–20)
CO2: 28 mmol/L (ref 22–32)
Calcium: 7.5 mg/dL — ABNORMAL LOW (ref 8.9–10.3)
Chloride: 99 mmol/L (ref 98–111)
Creatinine, Ser: 8.01 mg/dL — ABNORMAL HIGH (ref 0.61–1.24)
GFR calc Af Amer: 8 mL/min — ABNORMAL LOW (ref >60)
GFR calc non Af Amer: 7 mL/min — ABNORMAL LOW (ref >60)
Glucose, Bld: 138 mg/dL — ABNORMAL HIGH (ref 70–99)
Phosphorus: 3.9 mg/dL (ref 2.5–4.6)
Potassium: 5.6 mmol/L — ABNORMAL HIGH (ref 3.5–5.1)
Sodium: 138 mmol/L (ref 135–145)

## 2018-05-05 LAB — CBC
HCT: 20.3 % — ABNORMAL LOW (ref 39.0–52.0)
Hemoglobin: 6 g/dL — ABNORMAL LOW (ref 13.0–17.0)
MCH: 25.4 pg — ABNORMAL LOW (ref 26.0–34.0)
MCHC: 29.6 g/dL — ABNORMAL LOW (ref 30.0–36.0)
MCV: 86 fL (ref 80.0–100.0)
NRBC: 0 % (ref 0.0–0.2)
Platelets: 344 10*3/uL (ref 150–400)
RBC: 2.36 MIL/uL — ABNORMAL LOW (ref 4.22–5.81)
RDW: 19.1 % — ABNORMAL HIGH (ref 11.5–15.5)
WBC: 7.2 10*3/uL (ref 4.0–10.5)

## 2018-05-05 LAB — POTASSIUM: Potassium: 4.6 mmol/L (ref 3.5–5.1)

## 2018-05-05 LAB — HEMOGLOBIN AND HEMATOCRIT, BLOOD
HEMATOCRIT: 20.8 % — AB (ref 39.0–52.0)
HEMOGLOBIN: 6.2 g/dL — AB (ref 13.0–17.0)

## 2018-05-05 LAB — PREPARE RBC (CROSSMATCH)

## 2018-05-05 MED ORDER — SODIUM CHLORIDE 0.9% IV SOLUTION
Freq: Once | INTRAVENOUS | Status: AC
  Administered 2018-05-05: 15:00:00 via INTRAVENOUS

## 2018-05-05 MED ORDER — EPOETIN ALFA 10000 UNIT/ML IJ SOLN
10000.0000 [IU] | INTRAMUSCULAR | Status: DC
  Administered 2018-05-07: 10000 [IU] via INTRAVENOUS

## 2018-05-05 MED ORDER — PANTOPRAZOLE SODIUM 40 MG IV SOLR
40.0000 mg | Freq: Two times a day (BID) | INTRAVENOUS | Status: DC
  Administered 2018-05-05 – 2018-05-07 (×5): 40 mg via INTRAVENOUS
  Filled 2018-05-05 (×5): qty 40

## 2018-05-05 NOTE — Progress Notes (Signed)
[name], MD [facility]   [address] [place], [place] [address] Phone: [phone] Fax : [phone]   Subjective: This patient was recently seen by me at his last admission with an EGD showing severe erosive esophagitis with no other source of bleeding except for the esophagitis.  The patient was seen today in dialysis.  He was readmitted due to his upper GI bleeding with vomiting some blood.  The patient had multiple endoscopies and colonoscopies in the past.   Objective: Vital signs in last 24 hours: Vitals:   05/05/18 1115 05/05/18 1130 05/05/18 1145 05/05/18 1200  BP: 93/61 (!) 108/51 (!) 97/59   Pulse: 80 78 78   Resp: 15 15 15    Temp:   98.1 F (36.7 C)   TempSrc:   Oral   SpO2: 98% 98% 98%   Weight:    75 kg  Height:       Weight change: -4.9 kg  Intake/Output Summary (Last 24 hours) at 05/05/2018 1428 Last data filed at 05/05/2018 1145 Gross per 24 hour  Intake 852.72 ml  Output -190 ml  Net 1042.72 ml     Exam: Heart:: Regular rate and rhythm, S1S2 present or without murmur or extra heart sounds Lungs: normal and clear to auscultation and percussion Abdomen: soft, nontender, normal bowel sounds   Lab Results: @LABTEST2 @ Micro Results: Recent Results (from the past 240 hour(s))  MRSA PCR Screening     Status: None   Collection Time: 04/26/18  1:09 PM  Result Value Ref Range Status   MRSA by PCR NEGATIVE NEGATIVE Final    Comment:        The GeneXpert MRSA Assay (FDA approved for NASAL specimens only), is one component of a comprehensive MRSA colonization surveillance program. It is not intended to diagnose MRSA infection nor to guide or monitor treatment for MRSA infections. Performed at [facility], [address], [place], [place] [address]    Studies/Results: Dg Chest Portable 1 View  Result Date: 05/03/2018 CLINICAL DATA:  58 y/o M; rectal bleeding. Patient has not been to dialysis for 1 week. Central chest pain with  shortness of breath. EXAM: PORTABLE CHEST 1 VIEW COMPARISON:  04/26/2018 chest radiograph. FINDINGS: Stable cardiomegaly given projection and technique. Right central venous catheter is stable in position projecting over lower SVC. Aortic calcific atherosclerosis. Post median sternotomy with wires in alignment. Stable reticular opacities with basilar predominance compatible with interstitial pulmonary edema. No new consolidation, effusion, or pneumothorax. No acute osseous abnormality is evident. IMPRESSION: Stable cardiomegaly and interstitial pulmonary edema. Aortic Atherosclerosis (ICD10-I70.0). Electronically Signed   By: Kristine Garbe M.D.   On: 05/03/2018 22:36   Medications: I have reviewed the patient's current medications. Scheduled Meds: . sodium chloride   Intravenous Once  . calcium acetate  2,001 mg Oral TID WC  . Chlorhexidine Gluconate Cloth  6 each Topical Q0600  . docusate sodium  100 mg Oral BID  . epoetin (EPOGEN/PROCRIT) injection  10,000 Units Intravenous Q T,Th,Sa-HD  . feeding supplement (NEPRO CARB STEADY)  237 mL Oral BID BM  . folic acid  1 mg Oral Daily  . gabapentin  300 mg Oral TID  . labetalol  100 mg Oral Daily  . mometasone-formoterol  2 puff Inhalation BID  . multivitamin  1 tablet Oral QHS  . multivitamin  1 tablet Oral QHS  . pantoprazole  40 mg Intravenous Q12H  . sucralfate  1 g Oral TID WC & HS  . tiotropium  18 mcg Inhalation Daily  . vitamin C  500 mg Oral BID   Continuous Infusions: PRN Meds:.acetaminophen **OR** acetaminophen, ipratropium-albuterol, nitroGLYCERIN, ondansetron **OR** ondansetron (ZOFRAN) IV, oxyCODONE   Assessment: Active Problems:   Hyperkalemia Hematemesis  Plan: This patient has been seen multiple times for recurrent GI bleeding.  The patient has had multiple EGDs and colonoscopies.  The most common finding is the patient's erosive esophagitis that was found recently at his previous admission.  The patient has been  told that he should stay on his PPI.  There is nothing to do endoscopically for this patient's recurrent esophagitis.  The patient should be treated with for the esophagitis and follow-up with Dr. Vira Agar his primary gastroenterologist as an outpatient.  I will sign off.  Please call if any further GI concerns or questions.  We would like to thank you for the opportunity to participate in the care of Ryden Wainer.     LOS: 1 day   [name] 05/05/2018, 2:28 PM

## 2018-05-05 NOTE — Progress Notes (Signed)
[place], [place] 05/05/18  Subjective:   Patient known to our practice from previous admissions.  Currently he does not have transportation to get to an outpatient dialysis center therefore has to come to the ER for emergency dialysis.  Last dialysis was on February 13 This time he presents to the emergency room via EMS from home due to rectal bleeding.  Evaluation showed severe hyperkalemia with potassium of 7.3. Patient underwent emergency hemodialysis 2/20.  1500 cc of fluid was removed Today he feels a little better.  Still reports of melena.  Received another unit of blood last night.  Hemoglobin this morning 6.0 Patient seen during dialysis.  Tolerating well.  Blood pressure remains low normal   HEMODIALYSIS FLOWSHEET:  Blood Flow Rate (mL/min): 400 mL/min Arterial Pressure (mmHg): -130 mmHg Venous Pressure (mmHg): 100 mmHg Transmembrane Pressure (mmHg): 40 mmHg Ultrafiltration Rate (mL/min): 0 mL/min Dialysate Flow Rate (mL/min): 800 ml/min Conductivity: Machine : 13.8 Conductivity: Machine : 13.8 Dialysis Fluid Bolus: Normal Saline Bolus Amount (mL): 250 mL    Objective:  Vital signs in last 24 hours:  Temp:  [97.8 F (36.6 C)-99.4 F (37.4 C)] 98.1 F (36.7 C) (02/22 1145) Pulse Rate:  [77-87] 78 (02/22 1145) Resp:  [12-19] 15 (02/22 1145) BP: (89-118)/(46-81) 97/59 (02/22 1145) SpO2:  [96 %-100 %] 98 % (02/22 1145) Weight:  [73.7 kg-75 kg] 75 kg (02/22 1200)  Weight change: -4.9 kg Filed Weights   05/04/18 1836 05/05/18 0815 05/05/18 1200  Weight: 73.7 kg 74.9 kg 75 kg    Intake/Output:    Intake/Output Summary (Last 24 hours) at 05/05/2018 1306 Last data filed at 05/05/2018 1145 Gross per 24 hour  Intake 852.72 ml  Output -190 ml  Net 1042.72 ml     Physical Exam: General:  Chronically ill-appearing, laying in the bed  HEENT  moist oral mucous membranes  Pulm/lungs  clear to auscultation bilaterally  CVS/Heart  no rub   Abdomen:   Distended, nontender, ascites  Extremities:  No edema  Neurologic:  Alert, oriented  Skin:  No acute rashes  Access:  Right IJ PermCath       Basic Metabolic Panel:  Recent Labs  Lab 04/28/18 1850 05/03/18 2054 05/04/18 0223 05/05/18 0514  NA 137 138  --  138  K 5.5* 7.3*  --  5.6*  CL 93* 92*  --  99  CO2 31 29  --  28  GLUCOSE 102* 113*  --  138*  BUN 72* 93*  --  70*  CREATININE 10.33* 12.04*  --  8.01*  CALCIUM 7.7* 7.9*  --  7.5*  PHOS 5.6*  --  4.4 3.9     CBC: Recent Labs  Lab 04/28/18 1850 05/03/18 2054 05/04/18 1736 05/05/18 0514  WBC 12.2* 11.7*  --  7.2  HGB 7.6* 6.8* 5.9* 6.0*  HCT 27.6* 23.2*  --  20.3*  MCV 88.7 84.1  --  86.0  PLT 315 447*  --  344      Lab Results  Component Value Date   HEPBSAG Negative 06/16/2017   HEPBSAB Reactive 03/01/2017   HEPBIGM Negative 06/01/2016      Microbiology:  Recent Results (from the past 240 hour(s))  MRSA PCR Screening     Status: None   Collection Time: 04/26/18  1:09 PM  Result Value Ref Range Status   MRSA by PCR NEGATIVE NEGATIVE Final    Comment:        The GeneXpert MRSA  Assay (FDA approved for NASAL specimens only), is one component of a comprehensive MRSA colonization surveillance program. It is not intended to diagnose MRSA infection nor to guide or monitor treatment for MRSA infections. Performed at [facility], [address] [place], [place] [address]     Coagulation Studies: Recent Labs    05/03/18 Jun 29, 2052  LABPROT 14.7  INR 1.16    Urinalysis: No results for input(s): COLORURINE, LABSPEC, PHURINE, GLUCOSEU, HGBUR, BILIRUBINUR, KETONESUR, PROTEINUR, UROBILINOGEN, NITRITE, LEUKOCYTESUR in the last 72 hours.  Invalid input(s): APPERANCEUR    Imaging: Dg Chest Portable 1 View  Result Date: 05/03/2018 CLINICAL DATA:  58 y/o M; rectal bleeding. Patient has not been to dialysis for 1 week. Central chest pain with shortness of breath. EXAM:  PORTABLE CHEST 1 VIEW COMPARISON:  04/26/2018 chest radiograph. FINDINGS: Stable cardiomegaly given projection and technique. Right central venous catheter is stable in position projecting over lower SVC. Aortic calcific atherosclerosis. Post median sternotomy with wires in alignment. Stable reticular opacities with basilar predominance compatible with interstitial pulmonary edema. No new consolidation, effusion, or pneumothorax. No acute osseous abnormality is evident. IMPRESSION: Stable cardiomegaly and interstitial pulmonary edema. Aortic Atherosclerosis (ICD10-I70.0). Electronically Signed   By: Kristine Garbe M.D.   On: 05/03/2018 22:36     Medications:    . sodium chloride   Intravenous Once  . calcium acetate  2,001 mg Oral TID WC  . Chlorhexidine Gluconate Cloth  6 each Topical Q0600  . docusate sodium  100 mg Oral BID  . epoetin (EPOGEN/PROCRIT) injection  10,000 Units Intravenous Q T,Th,Sa-HD  . feeding supplement (NEPRO CARB STEADY)  237 mL Oral BID BM  . folic acid  1 mg Oral Daily  . gabapentin  300 mg Oral TID  . labetalol  100 mg Oral Daily  . mometasone-formoterol  2 puff Inhalation BID  . multivitamin  1 tablet Oral QHS  . multivitamin  1 tablet Oral QHS  . pantoprazole  40 mg Intravenous Q12H  . sucralfate  1 g Oral TID WC & HS  . tiotropium  18 mcg Inhalation Daily  . vitamin C  500 mg Oral BID   acetaminophen **OR** acetaminophen, ipratropium-albuterol, nitroGLYCERIN, ondansetron **OR** ondansetron (ZOFRAN) IV, oxyCODONE  Assessment/ Plan:  58 y.o. African-American male withend stage renal disease on hemodialysis secondary to Alport's syndrome, ascites, hypertension, anemia of chronic kidney disease, coronary artery disease, peripheral vascular disease, hyperlipidemia, gastrointestinal AVMs, pulmonary hypertension , GI bleed. Endoscopy on 2/14 showing esophagitis.   [facility] RIJ permcath.210 min  End-stage renal disease Severe  hyperkalemia Anemia chronic kidney disease and GI bleed Ascites.  Last paracentesis February 14 yielding 7.3 L Secondary hyperparathyroidism  Patient received emergency hemodialysis at admission.  Tolerating hemodialysis today.  Minimal ultrafiltration due to low blood pressure.  Potassium 5.6.  Expected to correct with hemodialysis..  Monitor electrolytes including potassium level during hospitalization.  Continue calcium acetate as binders.  Low phosphorus diet.  Previously GI had recommended for patient to undergo double-balloon enteroscopy at [facility].  Patient has not followed up. Blood transfusions as per internal medicine team and GI team We will continue high-dose Epogen with each hemodialysis treatment    LOS: [place] 2/22/20201:06 PM  [place], [place]  Note: This note was prepared with Dragon dictation. Any transcription errors are unintentional

## 2018-05-05 NOTE — Progress Notes (Signed)
Pt. completed 1  Unit prbc's without difficulty.  No signs and symptoms of reaction noted.  Pt. stated that he noticed two small red dots in the commode.  No other issues noted at this time. Will continue to monitor.

## 2018-05-05 NOTE — Progress Notes (Signed)
[place] at [place] NAME: Stanley [name]    MR#:  [id number]  DATE OF BIRTH:  07/19/1960  SUBJECTIVE:  CHIEF COMPLAINT: Patient is reporting exertional dyspnea.  Hemodialysis today  REVIEW OF SYSTEMS:  CONSTITUTIONAL: No fever, fatigue or weakness.  EYES: No blurred or double vision.  EARS, NOSE, AND THROAT: No tinnitus or ear pain.  RESPIRATORY: No cough, endorses exertional shortness of breath, denies wheezing or hemoptysis.  CARDIOVASCULAR: No chest pain, orthopnea, edema.  GASTROINTESTINAL: No nausea, vomiting, diarrhea or abdominal pain.  GENITOURINARY: No dysuria, hematuria.  ENDOCRINE: No polyuria, nocturia,  HEMATOLOGY: No anemia, easy bruising or bleeding SKIN: No rash or lesion. MUSCULOSKELETAL: No joint pain or arthritis.   NEUROLOGIC: No tingling, numbness, weakness.  PSYCHIATRY: No anxiety or depression.   DRUG ALLERGIES:  No Known Allergies  VITALS:  Blood pressure (!) 97/59, pulse 78, temperature 98.1 F (36.7 C), temperature source Oral, resp. rate 15, height 6\' 3"  (1.905 m), weight 75 kg, SpO2 98 %.  PHYSICAL EXAMINATION:  GENERAL:  58 y.o.-year-old patient lying in the bed with no acute distress.  EYES: Pupils equal, round, reactive to light and accommodation. No scleral icterus. Extraocular muscles intact.  HEENT: Head atraumatic, normocephalic. Oropharynx and nasopharynx clear.  NECK:  Supple, no jugular venous distention. No thyroid enlargement, no tenderness.  LUNGS: Normal breath sounds bilaterally, no wheezing, rales,rhonchi or crepitation. No use of accessory muscles of respiration.  Permacath on right anterior chest wall CARDIOVASCULAR: S1, S2 normal. No murmurs, rubs, or gallops.  ABDOMEN: Soft, nontender, nondistended. Bowel sounds present.  EXTREMITIES: No pedal edema, cyanosis, or clubbing.  NEUROLOGIC: Awake, alert and oriented x3. Sensation intact. Gait not checked.  PSYCHIATRIC: The patient  is alert and oriented x 3.  SKIN: No obvious rash, lesion, or ulcer.    LABORATORY PANEL:   CBC Recent Labs  Lab 05/05/18 0514 05/05/18 1339  WBC 7.2  --   HGB 6.0* 6.2*  HCT 20.3* 20.8*  PLT 344  --    ------------------------------------------------------------------------------------------------------------------  Chemistries  Recent Labs  Lab 05/03/18 2054 05/05/18 0514  NA 138 138  K 7.3* 5.6*  CL 92* 99  CO2 29 28  GLUCOSE 113* 138*  BUN 93* 70*  CREATININE 12.04* 8.01*  CALCIUM 7.9* 7.5*  AST 19  --   ALT 6  --   ALKPHOS 100  --   BILITOT 0.5  --    ------------------------------------------------------------------------------------------------------------------  Cardiac Enzymes Recent Labs  Lab 05/03/18 2054  TROPONINI 0.04*   ------------------------------------------------------------------------------------------------------------------  RADIOLOGY:  Dg Chest Portable 1 View  Result Date: 05/03/2018 CLINICAL DATA:  58 y/o M; rectal bleeding. Patient has not been to dialysis for 1 week. Central chest pain with shortness of breath. EXAM: PORTABLE CHEST 1 VIEW COMPARISON:  04/26/2018 chest radiograph. FINDINGS: Stable cardiomegaly given projection and technique. Right central venous catheter is stable in position projecting over lower SVC. Aortic calcific atherosclerosis. Post median sternotomy with wires in alignment. Stable reticular opacities with basilar predominance compatible with interstitial pulmonary edema. No new consolidation, effusion, or pneumothorax. No acute osseous abnormality is evident. IMPRESSION: Stable cardiomegaly and interstitial pulmonary edema. Aortic Atherosclerosis (ICD10-I70.0). Electronically Signed   By: Kristine Garbe M.D.   On: 05/03/2018 22:36    EKG:   Orders placed or performed during the hospital encounter of 05/03/18  . EKG 12-Lead  . EKG 12-Lead  . ED EKG  . ED EKG  . EKG 12-Lead  . EKG 12-Lead  .  ED  EKG  . ED EKG  . ED EKG  . ED EKG  . ED EKG  . ED EKG  . EKG 12-Lead  . EKG 12-Lead  . EKG 12-Lead  . EKG 12-Lead  . EKG 12-Lead  . EKG 12-Lead    ASSESSMENT AND PLAN:   1.  Hematemesis: Probably from erosive esophagitis- recurrent bleeding.  History of cirrhosis and varices but also history of posterior oropharyngeal bleeding.  No active episodes of bleeding.  GI is not recommending any interventions at this time Protonix 40 mg twice a day and outpatient GI follow-up with patient's primary gastroenterologist Dr. Vira Agar Monitor hemoglobin hematocrit 2.  Hyperkalemia: With EKG changes.  The patient has completed hemodialysis in the emergency department and again today Nephrology to follow. 3.  ESRD: On hemodialysis; had hemodialysis today dose fluids and drugs accordingly.  Continue PhosLo repeat.  Epogen during dialysis 4.  CHF: Chronic; diastolic.  Continue labetalol 5.  CAD: Stable; Plavix and aspirin 6.  Anemia of chronic kidney disease 1 unit of blood transfusion today hemoglobin at 6.  Check a.m. labs   DVT prophylaxis: SCDs 7.  GI prophylaxis: Pantoprazole     All the records are reviewed and case discussed with Care Management/Social Workerr. Management plans discussed with the patient, family and they are in agreement.  CODE STATUS: fc   TOTAL TIME TAKING CARE OF THIS PATIENT: 39  minutes.   POSSIBLE D/C IN 1-2  DAYS, DEPENDING ON CLINICAL CONDITION.  Note: This dictation was prepared with Dragon dictation along with smaller phrase technology. Any transcriptional errors that result from this process are unintentional.   Nicholes Mango M.D on 05/05/2018 at 3:03 PM  Between 7am to 6pm - Pager - [phone] After 6pm go to [email or web address] - password [email or web address] [facility]  Office  [phone]  CC: Primary care physician; [name], MD

## 2018-05-06 LAB — COMPREHENSIVE METABOLIC PANEL
ALBUMIN: 1.9 g/dL — AB (ref 3.5–5.0)
ALT: 8 U/L (ref 0–44)
AST: 18 U/L (ref 15–41)
Alkaline Phosphatase: 82 U/L (ref 38–126)
Anion gap: 9 (ref 5–15)
BUN: 41 mg/dL — ABNORMAL HIGH (ref 6–20)
CO2: 28 mmol/L (ref 22–32)
Calcium: 7.6 mg/dL — ABNORMAL LOW (ref 8.9–10.3)
Chloride: 99 mmol/L (ref 98–111)
Creatinine, Ser: 5.22 mg/dL — ABNORMAL HIGH (ref 0.61–1.24)
GFR calc Af Amer: 13 mL/min — ABNORMAL LOW (ref >60)
GFR calc non Af Amer: 11 mL/min — ABNORMAL LOW (ref >60)
Glucose, Bld: 97 mg/dL (ref 70–99)
POTASSIUM: 5.1 mmol/L (ref 3.5–5.1)
Sodium: 136 mmol/L (ref 135–145)
Total Bilirubin: 0.6 mg/dL (ref 0.3–1.2)
Total Protein: 5.7 g/dL — ABNORMAL LOW (ref 6.5–8.1)

## 2018-05-06 LAB — CBC
HCT: 23.7 % — ABNORMAL LOW (ref 39.0–52.0)
Hemoglobin: 7 g/dL — ABNORMAL LOW (ref 13.0–17.0)
MCH: 25.3 pg — ABNORMAL LOW (ref 26.0–34.0)
MCHC: 29.5 g/dL — ABNORMAL LOW (ref 30.0–36.0)
MCV: 85.6 fL (ref 80.0–100.0)
Platelets: 349 10*3/uL (ref 150–400)
RBC: 2.77 MIL/uL — ABNORMAL LOW (ref 4.22–5.81)
RDW: 18.5 % — AB (ref 11.5–15.5)
WBC: 8 10*3/uL (ref 4.0–10.5)
nRBC: 0 % (ref 0.0–0.2)

## 2018-05-06 MED ORDER — ALUM & MAG HYDROXIDE-SIMETH 200-200-20 MG/5ML PO SUSP
30.0000 mL | ORAL | Status: DC | PRN
  Administered 2018-05-06: 30 mL via ORAL
  Filled 2018-05-06: qty 30

## 2018-05-06 NOTE — Progress Notes (Signed)
[facility], [place] 05/06/18  Subjective:   Patient known to our practice from previous admissions.  Currently he does not have transportation to get to an outpatient dialysis center therefore has to come to the ER for emergency dialysis.  Last dialysis was on February 13 This time he presents to the emergency room via EMS from home due to rectal bleeding.  Evaluation showed severe hyperkalemia with potassium of 7.3. Patient underwent emergency hemodialysis 2/20.  1500 cc of fluid was removed Today he feels a little better.  Still reports of blood in stool.  Received multiple units of PRBC this admission Hemoglobin 7.0 this morning   Objective:  Vital signs in last 24 hours:  Temp:  [89.9 F (32.2 C)-98.9 F (37.2 C)] 98.4 F (36.9 C) (02/23 0432) Pulse Rate:  [76-86] 86 (02/23 0432) Resp:  [16-20] 20 (02/23 0432) BP: (104-114)/(56-71) 114/71 (02/23 0432) SpO2:  [97 %-100 %] 97 % (02/23 0432) Weight:  [75 kg-77 kg] 77 kg (02/23 0432)  Weight change: 1.2 kg Filed Weights   05/05/18 0815 05/05/18 1200 05/06/18 0432  Weight: 74.9 kg 75 kg 77 kg    Intake/Output:    Intake/Output Summary (Last 24 hours) at 05/06/2018 1148 Last data filed at 05/06/2018 0827 Gross per 24 hour  Intake 1220 ml  Output 125 ml  Net 1095 ml     Physical Exam: General:  Chronically ill-appearing, laying in the bed  HEENT  moist oral mucous membranes  Pulm/lungs  clear to auscultation bilaterally  CVS/Heart  no rub  Abdomen:   Distended, nontender, ascites  Extremities:  No edema  Neurologic:  Alert, oriented  Skin:  No acute rashes  Access:  Right IJ PermCath       Basic Metabolic Panel:  Recent Labs  Lab 05/03/18 2054 05/04/18 0223 05/05/18 0514 05/05/18 1831 05/06/18 0436  NA 138  --  138  --  136  K 7.3*  --  5.6* 4.6 5.1  CL 92*  --  99  --  99  CO2 29  --  28  --  28  GLUCOSE 113*  --  138*  --  97  BUN 93*  --  70*  --  41*  CREATININE 12.04*   --  8.01*  --  5.22*  CALCIUM 7.9*  --  7.5*  --  7.6*  PHOS  --  4.4 3.9  --   --      CBC: Recent Labs  Lab 05/03/18 2054 05/04/18 1736 05/05/18 0514 05/05/18 1339 05/06/18 0436  WBC 11.7*  --  7.2  --  8.0  HGB 6.8* 5.9* 6.0* 6.2* 7.0*  HCT 23.2*  --  20.3* 20.8* 23.7*  MCV 84.1  --  86.0  --  85.6  PLT 447*  --  344  --  349      Lab Results  Component Value Date   HEPBSAG Negative 06/16/2017   HEPBSAB Reactive 03/01/2017   HEPBIGM Negative 06/01/2016      Microbiology:  Recent Results (from the past 240 hour(s))  MRSA PCR Screening     Status: None   Collection Time: 04/26/18  1:09 PM  Result Value Ref Range Status   MRSA by PCR NEGATIVE NEGATIVE Final    Comment:        The GeneXpert MRSA Assay (FDA approved for NASAL specimens only), is one component of a comprehensive MRSA colonization surveillance program. It is not intended to diagnose MRSA infection nor  to guide or monitor treatment for MRSA infections. Performed at [facility], [address] [place], [place] [address]     Coagulation Studies: Recent Labs    05/03/18 Jun 19, 2052  LABPROT 14.7  INR 1.16    Urinalysis: No results for input(s): COLORURINE, LABSPEC, PHURINE, GLUCOSEU, HGBUR, BILIRUBINUR, KETONESUR, PROTEINUR, UROBILINOGEN, NITRITE, LEUKOCYTESUR in the last 72 hours.  Invalid input(s): APPERANCEUR    Imaging: No results found.   Medications:    . calcium acetate  2,001 mg Oral TID WC  . Chlorhexidine Gluconate Cloth  6 each Topical Q0600  . docusate sodium  100 mg Oral BID  . epoetin (EPOGEN/PROCRIT) injection  10,000 Units Intravenous Q T,Th,Sa-HD  . feeding supplement (NEPRO CARB STEADY)  237 mL Oral BID BM  . folic acid  1 mg Oral Daily  . gabapentin  300 mg Oral TID  . labetalol  100 mg Oral Daily  . mometasone-formoterol  2 puff Inhalation BID  . multivitamin  1 tablet Oral QHS  . multivitamin  1 tablet Oral QHS  . pantoprazole  40 mg Intravenous  Q12H  . sucralfate  1 g Oral TID WC & HS  . tiotropium  18 mcg Inhalation Daily  . vitamin C  500 mg Oral BID   acetaminophen **OR** acetaminophen, ipratropium-albuterol, nitroGLYCERIN, ondansetron **OR** ondansetron (ZOFRAN) IV, oxyCODONE  Assessment/ Plan:  58 y.o. African-American male withend stage renal disease on hemodialysis secondary to Alport's syndrome, ascites, hypertension, anemia of chronic kidney disease, coronary artery disease, peripheral vascular disease, hyperlipidemia, gastrointestinal AVMs, pulmonary hypertension , GI bleed. Endoscopy on 2/14 showing esophagitis.   [facility] ([facility]) RIJ permcath.210 min, currently no transportation to dialysis unit  End-stage renal disease Severe hyperkalemia Anemia chronic kidney disease and GI bleed Ascites.  Last paracentesis February 14 yielding 7.3 L Secondary hyperparathyroidism  Patient received emergency hemodialysis at admission. Minimal ultrafiltration due to low blood pressure.  Potassium 5.1.  Managed with hemodialysis.  Monitor electrolytes including potassium level during hospitalization.  Continue calcium acetate as binders and follow Low phosphorus diet.  Previously GI had recommended for patient to undergo double-balloon enteroscopy at [facility].  Patient has not followed up. Blood transfusions as per internal medicine team and GI team We will continue high-dose Epogen with each hemodialysis treatment We will set him up for hemodialysis tomorrow and follow MWF schedule while in the hospital    LOS: 2 Jeronimo Hellberg [name] 2/23/202011:48 [place], [place]  Note: This note was prepared with Dragon dictation. Any transcription errors are unintentional

## 2018-05-06 NOTE — Progress Notes (Signed)
C/o pain in toes of left foot. DP pulse present with doppler in left foot. Temp warm, no swelling or redness. Pulse in right DP with doppler also. Patient feels that pain has increased in last couple of days

## 2018-05-06 NOTE — Progress Notes (Signed)
[place] at [place] NAME: Stanley [name]    MR#:  [id number]  DATE OF BIRTH:  August 20, 1960  SUBJECTIVE:  CHIEF COMPLAINT: Patient is feeling fine .  Denies any episodes of vomiting HD for tomorrow  REVIEW OF SYSTEMS:  CONSTITUTIONAL: No fever, fatigue or weakness.  EYES: No blurred or double vision.  EARS, NOSE, AND THROAT: No tinnitus or ear pain.  RESPIRATORY: No cough, endorses exertional shortness of breath, denies wheezing or hemoptysis.  CARDIOVASCULAR: No chest pain, orthopnea, edema.  GASTROINTESTINAL: No nausea, vomiting, diarrhea or abdominal pain.  GENITOURINARY: No dysuria, hematuria.  ENDOCRINE: No polyuria, nocturia,  HEMATOLOGY: No anemia, easy bruising or bleeding SKIN: No rash or lesion. MUSCULOSKELETAL: No joint pain or arthritis.   NEUROLOGIC: No tingling, numbness, weakness.  PSYCHIATRY: No anxiety or depression.   DRUG ALLERGIES:  No Known Allergies  VITALS:  Blood pressure 106/67, pulse 84, temperature 99 F (37.2 C), temperature source Oral, resp. rate 18, height 6\' 3"  (1.905 m), weight 77 kg, SpO2 99 %.  PHYSICAL EXAMINATION:  GENERAL:  58 y.o.-year-old patient lying in the bed with no acute distress.  EYES: Pupils equal, round, reactive to light and accommodation. No scleral icterus. Extraocular muscles intact.  HEENT: Head atraumatic, normocephalic. Oropharynx and nasopharynx clear.  NECK:  Supple, no jugular venous distention. No thyroid enlargement, no tenderness.  LUNGS: Normal breath sounds bilaterally, no wheezing, rales,rhonchi or crepitation. No use of accessory muscles of respiration.  Permacath on right anterior chest wall CARDIOVASCULAR: S1, S2 normal. No murmurs, rubs, or gallops.  ABDOMEN: Soft, nontender, nondistended. Bowel sounds present.  EXTREMITIES: No pedal edema, cyanosis, or clubbing.  NEUROLOGIC: Awake, alert and oriented x3. Sensation intact. Gait not checked.  PSYCHIATRIC: The  patient is alert and oriented x 3.  SKIN: No obvious rash, lesion, or ulcer.    LABORATORY PANEL:   CBC Recent Labs  Lab 05/06/18 0436  WBC 8.0  HGB 7.0*  HCT 23.7*  PLT 349   ------------------------------------------------------------------------------------------------------------------  Chemistries  Recent Labs  Lab 05/06/18 0436  NA 136  K 5.1  CL 99  CO2 28  GLUCOSE 97  BUN 41*  CREATININE 5.22*  CALCIUM 7.6*  AST 18  ALT 8  ALKPHOS 82  BILITOT 0.6   ------------------------------------------------------------------------------------------------------------------  Cardiac Enzymes Recent Labs  Lab 05/03/18 2054  TROPONINI 0.04*   ------------------------------------------------------------------------------------------------------------------  RADIOLOGY:  No results found.  EKG:   Orders placed or performed during the hospital encounter of 05/03/18  . EKG 12-Lead  . EKG 12-Lead  . ED EKG  . ED EKG  . EKG 12-Lead  . EKG 12-Lead  . ED EKG  . ED EKG  . ED EKG  . ED EKG  . ED EKG  . ED EKG  . EKG 12-Lead  . EKG 12-Lead  . EKG 12-Lead  . EKG 12-Lead  . EKG 12-Lead  . EKG 12-Lead    ASSESSMENT AND PLAN:   1.  Hematemesis: Probably from erosive esophagitis- recurrent bleeding.  History of cirrhosis and varices but also history of posterior oropharyngeal bleeding.  No active episodes of bleeding.  GI is not recommending any interventions at this time Protonix 40 mg twice a day and outpatient GI follow-up with patient's primary gastroenterologist Dr. Vira Agar Monitor hemoglobin hematocrit.  At 7 today 2.  Hyperkalemia: With EKG changes.  The patient has completed hemodialysis in the emergency department and again on 05/05/2018 Nephrology recommending hemodialysis in a.m. prior to discharge  3.  ESRD: On hemodialysis; had hemodialysis today dose fluids and drugs accordingly.  Continue PhosLo repeat.  Epogen during dialysis 4.  CHF: Chronic;  diastolic.  Continue labetalol 5.  CAD: Stable; Plavix and aspirin 6.  Anemia of chronic kidney disease 1 unit of blood transfusion  hemoglobin at 6.2 > 1 unit of blood transfusion> 7.0 .  Check a.m. labs   DVT prophylaxis: SCDs 7.  GI prophylaxis: Pantoprazole     All the records are reviewed and case discussed with Care Management/Social Workerr. Management plans discussed with the patient, family and they are in agreement.  CODE STATUS: fc   TOTAL TIME TAKING CARE OF THIS PATIENT: 35  minutes.   POSSIBLE D/C IN 1- DAYS, DEPENDING ON CLINICAL CONDITION.  Note: This dictation was prepared with Dragon dictation along with smaller phrase technology. Any transcriptional errors that result from this process are unintentional.   Nicholes Mango M.D on 05/06/2018 at 10:04 PM  Between 7am to 6pm - Pager - [phone] After 6pm go to [email or web address] - password [email or web address] [facility]  Office  [phone]  CC: Primary care physician; [name], MD

## 2018-05-07 LAB — TYPE AND SCREEN
ABO/RH(D): A POS
Antibody Screen: NEGATIVE
Unit division: 0
Unit division: 0
Unit division: 0

## 2018-05-07 LAB — BPAM RBC
BLOOD PRODUCT EXPIRATION DATE: 202003202359
Blood Product Expiration Date: 202003172359
Blood Product Expiration Date: 202003182359
ISSUE DATE / TIME: 202002202308
ISSUE DATE / TIME: 202002212225
ISSUE DATE / TIME: 202002221539
Unit Type and Rh: 6200
Unit Type and Rh: 6200
Unit Type and Rh: 6200

## 2018-05-07 LAB — CBC
HCT: 26.1 % — ABNORMAL LOW (ref 39.0–52.0)
Hemoglobin: 7.8 g/dL — ABNORMAL LOW (ref 13.0–17.0)
MCH: 26.2 pg (ref 26.0–34.0)
MCHC: 29.9 g/dL — ABNORMAL LOW (ref 30.0–36.0)
MCV: 87.6 fL (ref 80.0–100.0)
Platelets: 403 10*3/uL — ABNORMAL HIGH (ref 150–400)
RBC: 2.98 MIL/uL — ABNORMAL LOW (ref 4.22–5.81)
RDW: 18.8 % — ABNORMAL HIGH (ref 11.5–15.5)
WBC: 10.5 10*3/uL (ref 4.0–10.5)
nRBC: 0 % (ref 0.0–0.2)

## 2018-05-07 LAB — BASIC METABOLIC PANEL
Anion gap: 11 (ref 5–15)
BUN: 58 mg/dL — ABNORMAL HIGH (ref 6–20)
CO2: 27 mmol/L (ref 22–32)
Calcium: 8.2 mg/dL — ABNORMAL LOW (ref 8.9–10.3)
Chloride: 97 mmol/L — ABNORMAL LOW (ref 98–111)
Creatinine, Ser: 6.48 mg/dL — ABNORMAL HIGH (ref 0.61–1.24)
GFR calc Af Amer: 10 mL/min — ABNORMAL LOW (ref >60)
GFR calc non Af Amer: 9 mL/min — ABNORMAL LOW (ref >60)
GLUCOSE: 89 mg/dL (ref 70–99)
Potassium: 6.4 mmol/L (ref 3.5–5.1)
Sodium: 135 mmol/L (ref 135–145)

## 2018-05-07 LAB — POTASSIUM: Potassium: 4 mmol/L (ref 3.5–5.1)

## 2018-05-07 MED ORDER — NEPRO/CARBSTEADY PO LIQD: 237.0000 mL | Freq: Two times a day (BID) | ORAL | 0 refills | Status: AC

## 2018-05-07 MED ORDER — DIPHENHYDRAMINE HCL 50 MG/ML IJ SOLN
25.0000 mg | Freq: Once | INTRAMUSCULAR | Status: AC
  Administered 2018-05-07: 25 mg via INTRAVENOUS

## 2018-05-07 MED ORDER — ACETAMINOPHEN 325 MG PO TABS: 650.0000 mg | ORAL_TABLET | Freq: Four times a day (QID) | ORAL | Status: AC | PRN

## 2018-05-07 MED ORDER — OMEPRAZOLE 40 MG PO CPDR: 40.0000 mg | DELAYED_RELEASE_CAPSULE | Freq: Two times a day (BID) | ORAL | 0 refills | Status: AC

## 2018-05-07 MED ORDER — DOCUSATE SODIUM 100 MG PO CAPS: 100.0000 mg | ORAL_CAPSULE | Freq: Two times a day (BID) | ORAL | 0 refills | Status: AC | PRN

## 2018-05-07 MED ORDER — BUDESONIDE-FORMOTEROL FUMARATE 160-4.5 MCG/ACT IN AERO: 2.0000 | INHALATION_SPRAY | Freq: Every day | RESPIRATORY_TRACT | 1 refills | Status: AC

## 2018-05-07 MED ORDER — IPRATROPIUM-ALBUTEROL 0.5-2.5 (3) MG/3ML IN SOLN: 3.0000 mL | Freq: Four times a day (QID) | RESPIRATORY_TRACT | 0 refills | Status: AC | PRN

## 2018-05-07 MED ORDER — OXYCODONE HCL 5 MG PO TABS: 5.0000 mg | ORAL_TABLET | Freq: Four times a day (QID) | ORAL | 0 refills | Status: AC | PRN

## 2018-05-07 MED ORDER — MORPHINE SULFATE (PF) 2 MG/ML IV SOLN
2.0000 mg | Freq: Once | INTRAVENOUS | Status: AC
  Administered 2018-05-07: 2 mg via INTRAVENOUS
  Filled 2018-05-07: qty 1

## 2018-05-07 MED ORDER — SUCRALFATE 1 GM/10ML PO SUSP: 1.0000 g | Freq: Three times a day (TID) | ORAL | 1 refills | Status: AC

## 2018-05-07 MED ORDER — ALUM & MAG HYDROXIDE-SIMETH 200-200-20 MG/5ML PO SUSP: 30.0000 mL | ORAL | 0 refills | Status: AC | PRN

## 2018-05-07 MED ORDER — TIOTROPIUM BROMIDE MONOHYDRATE 18 MCG IN CAPS: 18.0000 ug | ORAL_CAPSULE | Freq: Every day | RESPIRATORY_TRACT | 2 refills | Status: AC

## 2018-05-07 NOTE — Care Management (Signed)
Patient sats are 95% on RA with exertion. Patient does not qualify for home O2

## 2018-05-07 NOTE — Progress Notes (Signed)
HD tx start    05/07/18 0959  Vital Signs  Pulse Rate 79  Pulse Rate Source Monitor  Resp 13  BP 103/71  BP Location Left Arm  BP Method Automatic  Patient Position (if appropriate) Lying  Oxygen Therapy  SpO2 100 %  O2 Device Nasal Cannula  O2 Flow Rate (L/min) 2 L/min  During Hemodialysis Assessment  Blood Flow Rate (mL/min) 400 mL/min  Arterial Pressure (mmHg) -150 mmHg  Venous Pressure (mmHg) 120 mmHg  Transmembrane Pressure (mmHg) 60 mmHg  Ultrafiltration Rate (mL/min) 430 mL/min  Dialysate Flow Rate (mL/min) 800 ml/min  Conductivity: Machine  13.8  HD Safety Checks Performed Yes  Dialysis Fluid Bolus Normal Saline  Bolus Amount (mL) 250 mL  Intra-Hemodialysis Comments Tx initiated  Hemodialysis Catheter Right Internal jugular Double-lumen  No Placement Date or Time found.   Placed prior to admission: Yes  Orientation: Right  Access Location: Internal jugular  Hemodialysis Catheter Type: Double-lumen  Blue Lumen Status Infusing  Red Lumen Status Infusing

## 2018-05-07 NOTE — Progress Notes (Signed)
Pre HD assessment    05/07/18 0953  Neurological  Level of Consciousness Alert  Orientation Level Oriented X4  Respiratory  Respiratory Pattern Regular;Unlabored  Chest Assessment Chest expansion asymmetrical  Cough Non-productive  Cardiac  Pulse Regular  ECG Monitor Yes  Cardiac Rhythm NSR  Ectopy Unifocal PVC's  Ectopy Frequency Occasional  Vascular  R Radial Pulse +2  L Radial Pulse +2  Edema Generalized (right elbow)  Integumentary  Integumentary (WDL) X  Skin Color Appropriate for ethnicity  Musculoskeletal  Musculoskeletal (WDL) X  Generalized Weakness Yes  Assistive Device None  GU Assessment  Genitourinary (WDL) X  Genitourinary Symptoms  (HD)  Psychosocial  Psychosocial (WDL) X  Patient Behaviors Agitated

## 2018-05-07 NOTE — Discharge Instructions (Signed)
Follow-up with the primary care physician/nephrology in 2 to 3 days Continue hemodialysis on Monday Wednesday and Friday please repeat potassium during dialysis on Wednesday Outpatient follow-up with primary gastroenterology Dr. Vira Agar in 3 to 4 days

## 2018-05-07 NOTE — Progress Notes (Signed)
[place], [place] 05/07/18  Subjective:  Patient seen and evaluated during hemodialysis. Tolerating well. Hemoglobin up to 7.8 at the moment. Potassium high at 6.4.  Objective:  Vital signs in last 24 hours:  Temp:  [98.3 F (36.8 C)-99 F (37.2 C)] 98.9 F (37.2 C) (02/24 0952) Pulse Rate:  [77-86] 83 (02/24 1200) Resp:  [12-23] 17 (02/24 1200) BP: (87-112)/(51-82) 87/51 (02/24 1200) SpO2:  [1 %-100 %] 97 % (02/24 1200) Weight:  [78.4 kg-79.4 kg] 79.4 kg (02/24 0952)  Weight change: 3.5 kg Filed Weights   05/06/18 0432 05/07/18 0508 05/07/18 0952  Weight: 77 kg 78.4 kg 79.4 kg    Intake/Output:    Intake/Output Summary (Last 24 hours) at 05/07/2018 1223 Last data filed at 05/07/2018 0900 Gross per 24 hour  Intake 796 ml  Output 100 ml  Net 696 ml     Physical Exam: General:  Chronically ill-appearing, laying in the bed  HEENT  moist oral mucous membranes  Pulm/lungs  clear to auscultation bilaterally  CVS/Heart  no rub  Abdomen:   Distended, nontender, ascites  Extremities:  No edema  Neurologic:  Alert, oriented  Skin:  No acute rashes  Access:  Right IJ PermCath       Basic Metabolic Panel:  Recent Labs  Lab 05/03/18 2054 05/04/18 0223 05/05/18 0514 05/05/18 1831 05/06/18 0436 05/07/18 0600  NA 138  --  138  --  136 135  K 7.3*  --  5.6* 4.6 5.1 6.4*  CL 92*  --  99  --  99 97*  CO2 29  --  28  --  28 27  GLUCOSE 113*  --  138*  --  97 89  BUN 93*  --  70*  --  41* 58*  CREATININE 12.04*  --  8.01*  --  5.22* 6.48*  CALCIUM 7.9*  --  7.5*  --  7.6* 8.2*  PHOS  --  4.4 3.9  --   --   --      CBC: Recent Labs  Lab 05/03/18 2054 05/04/18 1736 05/05/18 0514 05/05/18 1339 05/06/18 0436 05/07/18 0600  WBC 11.7*  --  7.2  --  8.0 10.5  HGB 6.8* 5.9* 6.0* 6.2* 7.0* 7.8*  HCT 23.2*  --  20.3* 20.8* 23.7* 26.1*  MCV 84.1  --  86.0  --  85.6 87.6  PLT 447*  --  344  --  349 403*      Lab Results   Component Value Date   HEPBSAG Negative 06/16/2017   HEPBSAB Reactive 03/01/2017   HEPBIGM Negative 06/01/2016      Microbiology:  No results found for this or any previous visit (from the past 240 hour(s)).  Coagulation Studies: No results for input(s): LABPROT, INR in the last 72 hours.  Urinalysis: No results for input(s): COLORURINE, LABSPEC, PHURINE, GLUCOSEU, HGBUR, BILIRUBINUR, KETONESUR, PROTEINUR, UROBILINOGEN, NITRITE, LEUKOCYTESUR in the last 72 hours.  Invalid input(s): APPERANCEUR    Imaging: No results found.   Medications:    . calcium acetate  2,001 mg Oral TID WC  . Chlorhexidine Gluconate Cloth  6 each Topical Q0600  . docusate sodium  100 mg Oral BID  . epoetin (EPOGEN/PROCRIT) injection  10,000 Units Intravenous Q T,Th,Sa-HD  . feeding supplement (NEPRO CARB STEADY)  237 mL Oral BID BM  . folic acid  1 mg Oral Daily  . gabapentin  300 mg Oral TID  . labetalol  100 mg Oral  Daily  . mometasone-formoterol  2 puff Inhalation BID  . multivitamin  1 tablet Oral QHS  . multivitamin  1 tablet Oral QHS  . pantoprazole  40 mg Intravenous Q12H  . sucralfate  1 g Oral TID WC & HS  . tiotropium  18 mcg Inhalation Daily  . vitamin C  500 mg Oral BID   acetaminophen **OR** acetaminophen, alum & mag hydroxide-simeth, ipratropium-albuterol, nitroGLYCERIN, ondansetron **OR** ondansetron (ZOFRAN) IV, oxyCODONE  Assessment/ Plan:  58 y.o. African-American male withend stage renal disease on hemodialysis secondary to Alport's syndrome, ascites, hypertension, anemia of chronic kidney disease, coronary artery disease, peripheral vascular disease, hyperlipidemia, gastrointestinal AVMs, pulmonary hypertension , GI bleed. Endoscopy on 2/14 showing esophagitis.   [facility] ([facility]) RIJ permcath.210 min, currently no transportation to dialysis unit  End-stage renal disease Severe hyperkalemia, K 6.4. Anemia chronic kidney disease and GI bleed Ascites.   L Secondary hyperparathyroidism  Plan:  Patient seen and evaluated during hemodialysis.  Tolerating well.  Hyperkalemia noted with a serum potassium 6.4.  This will be treated effectively with dialysis.  He has recent anemia requiring blood confusion.  Hemoglobin up to 7.8.  We plan to complete dialysis treatment today.  Next dialysis treatment will be scheduled for Tuesday.  Currently he does not have transportation to his home dialysis unit. Hospital administration currently working on this.   LOS: 3 Santresa Levett 2/24/202012:23 PM  [place], [place]  Note: This note was prepared with Dragon dictation. Any transcription errors are unintentional

## 2018-05-07 NOTE — Discharge Summary (Signed)
[facility] NAME: Stanley [name]    MR#:  [id number]  DATE OF BIRTH:  02/21/1961  DATE OF ADMISSION:  05/03/2018 ADMITTING PHYSICIAN: Harrie Foreman, MD  DATE OF DISCHARGE: 05/07/18  PRIMARY CARE PHYSICIAN: [name], MD    ADMISSION DIAGNOSIS:  Hyperkalemia [E87.5] Elevated troponin [R79.89] Gastrointestinal hemorrhage with melena [K92.1] Chest pain, unspecified type [R07.9] Acute on chronic blood loss anemia [D62]  DISCHARGE DIAGNOSIS:  Active Problems:   Hyperkalemia  GI bleed SECONDARY DIAGNOSIS:   Past Medical History:  Diagnosis Date  . Alcohol abuse   . CHF (congestive heart failure) ([place])   . Cirrhosis ([place])   . Coronary artery disease 2009  . Drug abuse ([place])   . End stage renal disease on dialysis [facility]) NEPHROLOGIST-   DR [facility]  IN [place]   HEMODIALYSIS --   TUES/  THURS/  SAT  . Gastrointestinal bleed 06/13/2017   From chart...hx of multiple GI bleeds  . GERD (gastroesophageal reflux disease)   . Hyperlipidemia   . Hypertension   . PAD (peripheral artery disease) ([place])   . Renal insufficiency    Per pt, 32 oz fluid restriction per day  . S/P triple vessel bypass 06/09/2016   2009ish  . Suicidal ideation    & HOMICIDAL IDEATION --  06-16-2013   ADMITTED TO [place] COURSE:  HPI: The patient with past medical history of cirrhosis, end-stage renal disease on dialysis, hypertension, CAD and CHF presents to the emergency department complaining of chest pain.  The patient is somnolent and does not contribute much to his history for me but admits to some difficulty breathing.  The patient admitted to missing dialysis for 2 weeks.  Potassium was found to be greater than 7 and the patient had EKG changes concerning for electrolyte due to arrhythmia.  He was started on dialysis.  The patient also reported hematemesis and dark-colored stools.  He was found to be more  anemic than the last admission for the same.  1 unit packed red blood cells were transfused and once he was determined to be hemodynamically stable the emergency department staff called with the hospitalist service for admission.   1. Hematemesis: Probably from erosive esophagitis- recurrent bleeding. History of cirrhosis and varices but also history of posterior oropharyngeal bleeding.  No active episodes of bleeding.  GI is not recommending any interventions at this time Protonix 40 mg twice a day and outpatient GI follow-up with patient's primary gastroenterologist Dr. Vira Agar Monitor hemoglobin hematocrit.  At 7 today Holding Plavix until seen and cleared by gastroenterology   2. Hyperkalemia: With EKG changes. The patient has completed hemodialysis in the emergency department and again on 05/05/2018 Patient had hemodialysis today.  Supposed to get repeat potassium 1 hour after dialysis but patient does not want to wait and left the hospital even before blood work results   3. ESRD: On hemodialysis; had hemodialysis today dose fluids and drugs accordingly. Continue PhosLo repeat.  Epogen during dialysis 4. CHF: Chronic; diastolic. Continue labetalol 5. CAD: Stable; Plavix and aspirin 6.  Anemia of chronic kidney disease 1 unit of blood transfusion  hemoglobin at 6.2 > 1 unit of blood transfusion> 7.0 .   DVT prophylaxis: SCDs 7. GI prophylaxis: Pantoprazole  DISCHARGE CONDITIONS:   ok  CONSULTS OBTAINED:  Treatment Team:  Murlean Iba, MD   PROCEDURES   DRUG ALLERGIES:  No Known Allergies  DISCHARGE MEDICATIONS:  Allergies as of 05/07/2018   No Known Allergies     Medication List    STOP taking these medications   clopidogrel 75 MG tablet Commonly known as:  PLAVIX     TAKE these medications   acetaminophen 325 MG tablet Commonly known as:  TYLENOL Take 2 tablets (650 mg total) by mouth every 6 (six) hours as needed for mild pain (or Fever >/=  101).   alum & mag hydroxide-simeth 200-200-20 MG/5ML suspension Commonly known as:  MAALOX/MYLANTA Take 30 mLs by mouth every 4 (four) hours as needed for indigestion.   ascorbic acid 250 MG tablet Commonly known as:  VITAMIN C Take 1 tablet (250 mg total) by mouth 2 (two) times daily.   aspirin EC 81 MG tablet Take 1 tablet (81 mg total) by mouth daily.   budesonide-formoterol 160-4.5 MCG/ACT inhaler Commonly known as:  SYMBICORT Inhale 2 puffs into the lungs daily.   calcium acetate 667 MG capsule Commonly known as:  PHOSLO Take 2,001 mg by mouth 3 (three) times daily.   docusate sodium 100 MG capsule Commonly known as:  COLACE Take 1 capsule (100 mg total) by mouth 2 (two) times daily as needed for mild constipation.   feeding supplement (NEPRO CARB STEADY) Liqd Take 237 mLs by mouth 2 (two) times daily between meals.   folic acid 1 MG tablet Commonly known as:  FOLVITE Take 1 tablet (1 mg total) by mouth daily.   gabapentin 300 MG capsule Commonly known as:  NEURONTIN Take 300 mg by mouth 3 (three) times daily.   ipratropium-albuterol 0.5-2.5 (3) MG/3ML Soln Commonly known as:  DUONEB Take 3 mLs by nebulization every 6 (six) hours as needed.   labetalol 100 MG tablet Commonly known as:  NORMODYNE Take 100 mg by mouth daily.   multivitamin Tabs tablet Take 1 tablet by mouth at bedtime.   omeprazole 40 MG capsule Commonly known as:  PRILOSEC Take 1 capsule (40 mg total) by mouth 2 (two) times daily. What changed:  when to take this   oxyCODONE 5 MG immediate release tablet Commonly known as:  Oxy IR/ROXICODONE Take 1 tablet (5 mg total) by mouth every 6 (six) hours as needed for moderate pain. What changed:  when to take this   sucralfate 1 GM/10ML suspension Commonly known as:  CARAFATE Take 10 mLs (1 g total) by mouth 4 (four) times daily -  with meals and at bedtime.   tiotropium 18 MCG inhalation capsule Commonly known as:  SPIRIVA  HANDIHALER Place 1 capsule (18 mcg total) into inhaler and inhale daily.        DISCHARGE INSTRUCTIONS:   Follow-up with the primary care physician/nephrology in 2 to 3 days Continue hemodialysis on Monday Wednesday and Friday please repeat potassium during dialysis on Wednesday Outpatient follow-up with primary gastroenterology Dr. Vira Agar in 3 to 4 days  DIET:  Renal diet  DISCHARGE CONDITION:  Fair  ACTIVITY:  Activity as tolerated  OXYGEN:  Home Oxygen: No.   Oxygen Delivery: room air  DISCHARGE LOCATION:  home   If you experience worsening of your admission symptoms, develop shortness of breath, life threatening emergency, suicidal or homicidal thoughts you must seek medical attention immediately by calling 911 or calling your MD immediately  if symptoms less severe.  You Must read complete instructions/literature along with all the possible adverse reactions/side effects for all the Medicines you take and that have been prescribed to you. Take any new Medicines after you have completely  understood and accpet all the possible adverse reactions/side effects.   Please note  You were cared for by a hospitalist during your hospital stay. If you have any questions about your discharge medications or the care you received while you were in the hospital after you are discharged, you can call the unit and asked to speak with the hospitalist on call if the hospitalist that took care of you is not available. Once you are discharged, your primary care physician will handle any further medical issues. Please note that NO REFILLS for any discharge medications will be authorized once you are discharged, as it is imperative that you return to your primary care physician (or establish a relationship with a primary care physician if you do not have one) for your aftercare needs so that they can reassess your need for medications and monitor your lab values.     Today  Chief Complaint   Patient presents with  . Chest Pain   Patient had hemodialysis and left hospital even before repeat potassium results.  Patient admits intermittent episodes of some blood in his stool GI recommended outpatient follow-up with his primary gastroenterologist  ROS:  CONSTITUTIONAL: Denies fevers, chills. Denies any fatigue, weakness.  EYES: Denies blurry vision, double vision, eye pain. EARS, NOSE, THROAT: Denies tinnitus, ear pain, hearing loss. RESPIRATORY: Denies cough, wheeze, shortness of breath.  CARDIOVASCULAR: Denies chest pain, palpitations, edema.  GASTROINTESTINAL: Denies nausea, vomiting, diarrhea, abdominal pain. Denies bright red blood per rectum. GENITOURINARY: Denies dysuria, hematuria. ENDOCRINE: Denies nocturia or thyroid problems. HEMATOLOGIC AND LYMPHATIC: Denies easy bruising or bleeding. SKIN: Denies rash or lesion. MUSCULOSKELETAL: Denies pain in neck, back, shoulder, knees, hips or arthritic symptoms.  NEUROLOGIC: Denies paralysis, paresthesias.  PSYCHIATRIC: Denies anxiety or depressive symptoms.   VITAL SIGNS:  Blood pressure 111/61, pulse 87, temperature 98.7 F (37.1 C), temperature source Oral, resp. rate 17, height 6\' 3"  (1.905 m), weight 78.7 kg, SpO2 95 %.  I/O:    Intake/Output Summary (Last 24 hours) at 05/07/2018 1510 Last data filed at 05/07/2018 1350 Gross per 24 hour  Intake 796 ml  Output 1105 ml  Net -309 ml    PHYSICAL EXAMINATION:  GENERAL:  58 y.o.-year-old patient lying in the bed with no acute distress.  EYES: Pupils equal, round, reactive to light and accommodation. No scleral icterus. Extraocular muscles intact.  HEENT: Head atraumatic, normocephalic. Oropharynx and nasopharynx clear.  NECK:  Supple, no jugular venous distention. No thyroid enlargement, no tenderness.  LUNGS: Normal breath sounds bilaterally, no wheezing, rales,rhonchi or crepitation. No use of accessory muscles of respiration.  CARDIOVASCULAR: S1, S2 normal. No  murmurs, rubs, or gallops.  ABDOMEN: Soft, non-tender, non-distended. Bowel sounds present. No organomegaly or mass.  EXTREMITIES: No pedal edema, cyanosis, or clubbing.  NEUROLOGIC: Awake, alert and oriented x3. Sensation intact. Gait not checked.  PSYCHIATRIC: The patient is alert and oriented x 3.  SKIN: No obvious rash, lesion, or ulcer.   DATA REVIEW:   CBC Recent Labs  Lab 05/07/18 0600  WBC 10.5  HGB 7.8*  HCT 26.1*  PLT 403*    Chemistries  Recent Labs  Lab 05/06/18 0436 05/07/18 0600  NA 136 135  K 5.1 6.4*  CL 99 97*  CO2 28 27  GLUCOSE 97 89  BUN 41* 58*  CREATININE 5.22* 6.48*  CALCIUM 7.6* 8.2*  AST 18  --   ALT 8  --   ALKPHOS 82  --   BILITOT 0.6  --  Cardiac Enzymes Recent Labs  Lab 05/03/18 2054  TROPONINI 0.04*    Microbiology Results  Results for orders placed or performed during the hospital encounter of 04/26/18  MRSA PCR Screening     Status: None   Collection Time: 04/26/18  1:09 PM  Result Value Ref Range Status   MRSA by PCR NEGATIVE NEGATIVE Final    Comment:        The GeneXpert MRSA Assay (FDA approved for NASAL specimens only), is one component of a comprehensive MRSA colonization surveillance program. It is not intended to diagnose MRSA infection nor to guide or monitor treatment for MRSA infections. Performed at [facility] [address], [place], [place] [address]     RADIOLOGY:  Dg Chest Portable 1 View  Result Date: 05/03/2018 CLINICAL DATA:  57 y/o M; rectal bleeding. Patient has not been to dialysis for 1 week. Central chest pain with shortness of breath. EXAM: PORTABLE CHEST 1 VIEW COMPARISON:  04/26/2018 chest radiograph. FINDINGS: Stable cardiomegaly given projection and technique. Right central venous catheter is stable in position projecting over lower SVC. Aortic calcific atherosclerosis. Post median sternotomy with wires in alignment. Stable reticular opacities with basilar predominance  compatible with interstitial pulmonary edema. No new consolidation, effusion, or pneumothorax. No acute osseous abnormality is evident. IMPRESSION: Stable cardiomegaly and interstitial pulmonary edema. Aortic Atherosclerosis (ICD10-I70.0). Electronically Signed   By: Kristine Garbe M.D.   On: 05/03/2018 22:36    EKG:   Orders placed or performed during the hospital encounter of 05/03/18  . EKG 12-Lead  . EKG 12-Lead  . ED EKG  . ED EKG  . EKG 12-Lead  . EKG 12-Lead  . ED EKG  . ED EKG  . ED EKG  . ED EKG  . ED EKG  . ED EKG  . EKG 12-Lead  . EKG 12-Lead  . EKG 12-Lead  . EKG 12-Lead  . EKG 12-Lead  . EKG 12-Lead      Management plans discussed with the patient, family and they are in agreement.  CODE STATUS:     Code Status Orders  (From admission, onward)         Start     Ordered   05/04/18 0739  Full code  Continuous     05/04/18 0739        Code Status History    Date Active Date Inactive Code Status Order ID Comments User Context   04/26/2018 1144 04/29/2018 1322 Full Code [id number]  Hillary Bow, MD ED   04/14/2018 1655 04/20/2018 1407 Full Code [id number]  Fritzi Mandes, MD Inpatient   03/26/2018 2223 03/27/2018 1803 Full Code [id number]  Dustin Flock, MD Inpatient   01/30/2018 0122 01/30/2018 2116 Full Code [id number]  Vaughan Basta, MD Inpatient   01/15/2018 1437 01/19/2018 1251 Full Code [id number]  Vaughan Basta, MD Inpatient   12/23/2017 2225 12/26/2017 1846 Full Code [id number]  Amelia Jo, MD Inpatient   12/09/2017 1043 12/11/2017 1617 Full Code [id number]  Epifanio Lesches, MD ED   11/27/2017 1122 11/28/2017 1827 Full Code [id number]  Demetrios Loll, MD Inpatient   11/18/2017 2008 11/20/2017 1853 Full Code [id number]  Saundra Shelling, MD Inpatient   10/30/2017 0007 10/31/2017 1724 Full Code [id number]  Amelia Jo, MD Inpatient   06/12/2017 1707 06/16/2017 1704 Full Code [id number]  Nicholes Mango, MD Inpatient   06/02/2017 1603 06/06/2017 2222  Full Code [id number]  Demetrios Loll, MD Inpatient   05/24/2017 1831 05/25/2017 1921 Full Code [id number]  Saundra Shelling,  MD ED   05/03/2017 1331 05/05/2017 1806 Full Code [id number]  Bettey Costa, MD Inpatient   04/01/2017 0947 04/03/2017 1156 Full Code [id number]  Loletha Grayer, MD ED   03/09/2017 0010 03/09/2017 1928 Full Code [id number]  Lance Coon, MD Inpatient   03/03/2017 2027 03/04/2017 1920 Full Code [id number]  Demetrios Loll, MD Inpatient   02/28/2017 2254 03/02/2017 1519 Full Code [id number]  Lance Coon, MD Inpatient   02/22/2017 1721 02/24/2017 1359 Full Code [id number]  Dustin Flock, MD Inpatient   01/22/2017 2337 01/25/2017 1522 Full Code [id number]  Gorden Harms, MD Inpatient   01/07/2017 1445 01/13/2017 1812 Full Code [id number]  Idelle Crouch, MD Inpatient   12/22/2016 1431 12/24/2016 1804 Full Code [id number]  Hillary Bow, MD ED   12/20/2016 2020 12/21/2016 1853 Full Code [id number]  Demetrios Loll, MD Inpatient   11/26/2016 1030 11/28/2016 1522 Full Code [id number]  Henreitta Leber, MD Inpatient   11/15/2016 1930 11/16/2016 1822 Full Code [id number]  Fritzi Mandes, MD Inpatient   10/29/2016 1453 10/30/2016 1506 Full Code [id number]  Loletha Grayer, MD ED   09/15/2016 1757 09/16/2016 1817 Full Code [id number]  Dustin Flock, MD ED   08/16/2016 0945 08/18/2016 1816 Full Code [id number]  Hillary Bow, MD ED   08/09/2016 1659 08/12/2016 2034 Full Code [id number]  Fritzi Mandes, MD Inpatient   07/03/2016 2154 07/05/2016 2122 Full Code [id number]  Demetrios Loll, MD Inpatient   06/24/2016 0423 06/26/2016 1546 Full Code [id number]  Saundra Shelling, MD Inpatient   06/14/2016 1453 06/19/2016 2104 Full Code [id number]  Hillary Bow, MD ED   05/31/2016 0642 06/01/2016 2130 Full Code [id number]  Saundra Shelling, MD Inpatient   05/05/2016 2309 05/10/2016 2157 Full Code [id number]  Vianne Bulls, MD Inpatient   04/16/2016 0356 04/17/2016 1827 Full Code [id number]  Saundra Shelling, MD Inpatient   01/19/2016 1513 01/22/2016 1414 Full Code  [id number]  Loletha Grayer, MD ED   09/24/2015 1033 09/25/2015 1331 Full Code [id number]  Loletha Grayer, MD ED   05/16/2015 1417 05/17/2015 2057 Full Code [id number]  Idelle Crouch, MD Inpatient   04/30/2015 2008 05/08/2015 1940 Full Code [id number]  Vaughan Basta, MD Inpatient   04/06/2015 0507 04/07/2015 1736 Full Code [id number]  Lance Coon, MD Inpatient   03/23/2015 1107 03/27/2015 1246 Full Code [id number]  Bettey Costa, MD Inpatient   06/19/2013 1132 06/21/2013 1749 Full Code [id number]  Cristal Ford, DO Inpatient   06/16/2013 1726 06/19/2013 1132 Full Code [id number]  Margarita Mail, PA-C ED   06/05/2013 1533 06/06/2013 1856 Full Code [id number]  Theodoro Kos, DO Inpatient   06/02/2013 0901 06/05/2013 1533 Full Code [id number]  Velvet Bathe, MD Inpatient   06/02/2013 0840 06/02/2013 0901 Full Code [id number]  Velvet Bathe, MD Inpatient      TOTAL TIME TAKING CARE OF THIS PATIENT: 43  minutes.   Note: This dictation was prepared with Dragon dictation along with smaller phrase technology. Any transcriptional errors that result from this process are unintentional.   @[facility] @  on 05/07/2018 at 3:10 PM  Between 7am to 6pm - Pager - [phone]  After 6pm go to [email or web address] - password [email or web address] [facility]  Office  [phone]  CC: Primary care physician; [name], MD

## 2018-05-07 NOTE — Progress Notes (Signed)
Pre HD assessment    05/07/18 0952  Vital Signs  Temp 98.9 F (37.2 C)  Temp Source Oral  Pulse Rate 78  Pulse Rate Source Monitor  Resp 18  BP 102/69  BP Location Left Arm  BP Method Automatic  Patient Position (if appropriate) Lying  Oxygen Therapy  SpO2 98 %  O2 Device Nasal Cannula  O2 Flow Rate (L/min) 2 L/min  Pain Assessment  Pain Scale 0-10  Pain Score 0  Dialysis Weight  Weight 79.4 kg  Type of Weight Pre-Dialysis  Time-Out for Hemodialysis  What Procedure? HD  Pt Identifiers(min of two) First/Last Name;MRN/Account#  Correct Site? Yes  Correct Side? Yes  Correct Procedure? Yes  Consents Verified? Yes  Rad Studies Available? N/A  Safety Precautions Reviewed? Yes  Engineer, civil (consulting) Number  ([id number])  Station Number 3  UF/Alarm Test Passed  Conductivity: Meter 13.8  Conductivity: Machine  13.9  pH 7.2  Reverse Osmosis main  Normal Saline Lot Number [id number]  Dialyzer Lot Number [id number]  Disposable Set Lot Number [id number]  Machine Temperature 98.6 F (37 C)  Musician and Audible Yes  Blood Lines Intact and Secured Yes  Pre Treatment Patient Checks  Vascular access used during treatment Catheter  Hepatitis B Surface Antigen Results Negative  Date Hepatitis B Surface Antigen Drawn 06/16/17  Hepatitis B Surface Antibody  (>10)  Date Hepatitis B Surface Antibody Drawn 06/16/17  Hemodialysis Consent Verified Yes  Hemodialysis Standing Orders Initiated Yes  ECG (Telemetry) Monitor On Yes  Prime Ordered Normal Saline  Length of  DialysisTreatment -hour(s) 3.5 Hour(s)  Dialyzer Elisio 17H NR  Dialysate 2K, 2.5 Ca  Dialysis Anticoagulant None  Dialysate Flow Ordered 800  Blood Flow Rate Ordered 400 mL/min  Ultrafiltration Goal 1 Liters  Dialysis Blood Pressure Support Ordered Normal Saline  Education / Care Plan  Dialysis Education Provided Yes  Documented Education in Care Plan Yes  Hemodialysis Catheter Right Internal jugular Double-lumen   No Placement Date or Time found.   Placed prior to admission: Yes  Orientation: Right  Access Location: Internal jugular  Hemodialysis Catheter Type: Double-lumen  Site Condition No complications  Dressing Type Biopatch  Dressing Status Clean;Dry;Intact  Drainage Description None

## 2018-05-07 NOTE — Care Management Important Message (Signed)
Copy of signed Medicare IM left in patient's room (out for dialysis).

## 2018-05-07 NOTE — Progress Notes (Signed)
Post HD assessment    05/07/18 1341  Neurological  Level of Consciousness Responds to Voice  Orientation Level Oriented X4  Respiratory  Respiratory Pattern Regular;Unlabored  Chest Assessment Chest expansion symmetrical  Cough Non-productive  Cardiac  Pulse Regular  ECG Monitor Yes  Cardiac Rhythm NSR  Ectopy Unifocal PVC's  Ectopy Frequency Frequent  Vascular  R Radial Pulse +2  L Radial Pulse +2  Edema Generalized (right elbow )  Integumentary  Integumentary (WDL) X  Skin Color Appropriate for ethnicity  Musculoskeletal  Musculoskeletal (WDL) X  Generalized Weakness Yes  Assistive Device None  GU Assessment  Genitourinary (WDL) X  Genitourinary Symptoms  (HD)  Psychosocial  Psychosocial (WDL) X  Patient Behaviors Agitated;Aggressive verbally

## 2018-05-07 NOTE — Progress Notes (Signed)
HD tx end    05/07/18 1340  Vital Signs  Pulse Rate 82  Pulse Rate Source Monitor  Resp 12  BP (!) 103/58  BP Location Left Arm  BP Method Automatic  Patient Position (if appropriate) Lying  Oxygen Therapy  SpO2 97 %  O2 Device Nasal Cannula  O2 Flow Rate (L/min) 2 L/min  During Hemodialysis Assessment  Dialysis Fluid Bolus Normal Saline  Bolus Amount (mL) 250 mL  Intra-Hemodialysis Comments Tx completed

## 2018-05-07 NOTE — Care Management (Addendum)
Amanda Morris dialysis liaison notified of discharge  

## 2018-05-07 NOTE — Progress Notes (Signed)
Pt came back from dialysis. Pt was ambulated to around the nursing station.O2 was stable. Potassium blood drawn at 14:40 as ordered. Pt did not want to wait for the potassium to be resulted. His sister was waiting downstairs already. Pt left with his paper work. No signs of acute distress.

## 2018-05-07 NOTE — Progress Notes (Signed)
Post HD assessment . Pt tolerated tx well without c/o or complication. Net UF 1005, goal met.    05/07/18 1350  Vital Signs  Temp 98.8 F (37.1 C)  Temp Source Oral  Pulse Rate 84  Pulse Rate Source Monitor  Resp 17  BP 108/60  BP Location Left Arm  BP Method Automatic  Patient Position (if appropriate) Lying  Oxygen Therapy  SpO2 98 %  O2 Device Nasal Cannula  O2 Flow Rate (L/min) 2 L/min  Dialysis Weight  Weight 78.7 kg  Type of Weight Post-Dialysis  Post-Hemodialysis Assessment  Rinseback Volume (mL) 250 mL  KECN 80.9 V  Dialyzer Clearance Lightly streaked  Duration of HD Treatment -hour(s) 3.5 hour(s)  Hemodialysis Intake (mL) 500 mL  UF Total -Machine (mL) 1505 mL  Net UF (mL) 1005 mL  Tolerated HD Treatment Yes  Education / Care Plan  Dialysis Education Provided Yes  Documented Education in Care Plan Yes  Hemodialysis Catheter Right Internal jugular Double-lumen  No Placement Date or Time found.   Placed prior to admission: Yes  Orientation: Right  Access Location: Internal jugular  Hemodialysis Catheter Type: Double-lumen  Site Condition No complications  Blue Lumen Status Heparin locked  Red Lumen Status Heparin locked  Purple Lumen Status Heparin locked  Catheter fill solution Heparin 1000 units/ml  Catheter fill volume (Arterial) 1.5 cc  Catheter fill volume (Venous) 1.5  Dressing Type Biopatch  Dressing Status Clean;Dry;Intact  Drainage Description None  Post treatment catheter status Capped and Clamped

## 2018-05-09 ENCOUNTER — Encounter: Payer: Self-pay | Admitting: Emergency Medicine

## 2018-05-09 ENCOUNTER — Other Ambulatory Visit: Payer: Self-pay

## 2018-05-09 ENCOUNTER — Emergency Department
Admission: EM | Admit: 2018-05-09 | Discharge: 2018-05-09 | Disposition: A | Discharge status: Home / Self Care | Payer: Medicare Other | Attending: Emergency Medicine | Admitting: Emergency Medicine

## 2018-05-09 DIAGNOSIS — I2510 Atherosclerotic heart disease of native coronary artery without angina pectoris: Secondary | ICD-10-CM | POA: Insufficient documentation

## 2018-05-09 DIAGNOSIS — I509 Heart failure, unspecified: Secondary | ICD-10-CM | POA: Insufficient documentation

## 2018-05-09 DIAGNOSIS — Z992 Dependence on renal dialysis: Secondary | ICD-10-CM | POA: Diagnosis present

## 2018-05-09 DIAGNOSIS — I132 Hypertensive heart and chronic kidney disease with heart failure and with stage 5 chronic kidney disease, or end stage renal disease: Secondary | ICD-10-CM | POA: Diagnosis not present

## 2018-05-09 DIAGNOSIS — N186 End stage renal disease: Secondary | ICD-10-CM | POA: Insufficient documentation

## 2018-05-09 DIAGNOSIS — F17210 Nicotine dependence, cigarettes, uncomplicated: Secondary | ICD-10-CM | POA: Diagnosis not present

## 2018-05-09 DIAGNOSIS — F1210 Cannabis abuse, uncomplicated: Secondary | ICD-10-CM | POA: Diagnosis not present

## 2018-05-09 DIAGNOSIS — Z79899 Other long term (current) drug therapy: Secondary | ICD-10-CM | POA: Insufficient documentation

## 2018-05-09 DIAGNOSIS — Z7982 Long term (current) use of aspirin: Secondary | ICD-10-CM | POA: Diagnosis not present

## 2018-05-09 DIAGNOSIS — Z4931 Encounter for adequacy testing for hemodialysis: Secondary | ICD-10-CM | POA: Diagnosis not present

## 2018-05-09 DIAGNOSIS — F1410 Cocaine abuse, uncomplicated: Secondary | ICD-10-CM | POA: Insufficient documentation

## 2018-05-09 NOTE — ED Triage Notes (Signed)
Pt in via POV, presents here for dialysis.  Ambulatory to triage, NAD noted at this time.

## 2018-05-09 NOTE — ED Notes (Signed)
Pt brought back from dialysis. Pt standing at doorway and states he has to go now. MD Siadecki at bedside with pt. Pt states to MD he doesn't need anything and leaves. Pt ambulatory with steady gait. No distress noted.

## 2018-05-09 NOTE — ED Provider Notes (Signed)
-----------------------------------------   5:26 PM on 05/09/2018 -----------------------------------------  The patient returned from dialysis.  He is currently asymptomatic and appears comfortable.  He states he feels well and would like to go home.  He is stable for discharge at this time.   Arta Silence, MD 05/09/18 1726

## 2018-05-09 NOTE — ED Provider Notes (Signed)
[facility] Emergency Department Provider Note   ____________________________________________   None    (approximate)  I have reviewed the triage vital signs and the nursing notes.   HISTORY  Chief Complaint Dialysis    HPI Stanley [name] is a 58 y.o. male patient comes for dialysis.  He is feeling okay.  Gaining a little bit of weight.  Otherwise doing well.   Past Medical History:  Diagnosis Date  . Alcohol abuse   . CHF (congestive heart failure) ([place])   . Cirrhosis ([place])   . Coronary artery disease 2009  . Drug abuse ([place])   . End stage renal disease on dialysis [facility]) NEPHROLOGIST-   DR [facility]  IN [place]   HEMODIALYSIS --   TUES/  THURS/  SAT  . Gastrointestinal bleed 06/13/2017   From chart...hx of multiple GI bleeds  . GERD (gastroesophageal reflux disease)   . Hyperlipidemia   . Hypertension   . PAD (peripheral artery disease) ([place])   . Renal insufficiency    Per pt, 32 oz fluid restriction per day  . S/P triple vessel bypass 06/09/2016   2009ish  . Suicidal ideation    & HOMICIDAL IDEATION --  06-16-2013   ADMITTED TO BEHAVIOR HEALTH    Patient Active Problem List   Diagnosis Date Noted  . PAD (peripheral artery disease) ([place]) 04/14/2018  . Healthcare-associated pneumonia 01/15/2018  . Acute on chronic respiratory failure ([place]) 01/15/2018  . Acute pulmonary edema ([place]) 12/23/2017  . Acute respiratory failure ([place]) 10/29/2017  . ESRD (end stage renal disease) on dialysis ([place]) 07/28/2017  . Protein-calorie malnutrition, severe 06/14/2017  . Encounter for dialysis [facility])   . Palliative care by specialist   . Goals of care, counseling/discussion   . Malnutrition of moderate degree 06/05/2017  . Secondary esophageal varices without bleeding ([place])   . Stomach irritation   . Idiopathic esophageal varices without bleeding ([place])   . Alcoholic hepatitis with ascites 05/24/2017  . ESRD (end stage renal disease) ([place])  04/28/2017  . Uremia 03/08/2017  . ESRD on hemodialysis ([place]) 03/03/2017  . Weakness 02/28/2017  . Hypocalcemia 02/22/2017  . Shortness of breath 11/26/2016  . COPD (chronic obstructive pulmonary disease) ([place]) 10/30/2016  . COPD exacerbation ([place]) 10/29/2016  . Anemia   . Heme positive stool   . Ulceration of intestine   . Benign neoplasm of transverse colon   . Acute gastrointestinal hemorrhage   . Esophageal candidiasis ([place])   . Angiodysplasia of intestinal tract   . Acute respiratory failure with hypoxia ([place]) 07/03/2016  . GI bleeding 06/24/2016  . Rectal bleeding 06/14/2016  . Anemia of chronic disease 06/01/2016  . MRSA carrier 06/01/2016  . Chronic renal failure 05/23/2016  . Ischemic heart disease 05/23/2016  . Angiodysplasia of small intestine   . Melena   . Small bowel bleed not requiring more than 4 units of blood in 24 hours, ICU, or surgery   . Anemia due to chronic blood loss   . Abdominal pain 05/05/2016  . Acute posthemorrhagic anemia 04/17/2016  . Gastrointestinal bleed 04/17/2016  . History of esophagogastroduodenoscopy (EGD) 04/17/2016  . Elevated troponin 04/17/2016  . Alcohol abuse 04/17/2016  . Upper GI bleed 01/19/2016  . Blood in stool   . Angiodysplasia of stomach and duodenum with hemorrhage   . Gastritis   . Reflux esophagitis   . GI bleed 05/16/2015  . Acute GI bleeding   . Symptomatic anemia 04/30/2015  . HTN (hypertension) 04/06/2015  .  GERD (gastroesophageal reflux disease) 04/06/2015  . HLD (hyperlipidemia) 04/06/2015  . Dyspnea 04/06/2015  . Cirrhosis of liver with ascites ([place]) 04/06/2015  . Ascites 04/06/2015  . GIB (gastrointestinal bleeding) 03/23/2015  . Homicidal ideation 06/19/2013  . Suicidal intent 06/19/2013  . Homicidal ideations 06/19/2013  . Hyperkalemia 06/16/2013  . Mandible fracture ([place]) 06/05/2013  . Fracture, mandible ([place]) 06/02/2013  . Coronary atherosclerosis of native coronary artery 06/02/2013  . ESRD on  dialysis ([place]) 06/02/2013  . Mandible open fracture ([place]) 06/02/2013    Past Surgical History:  Procedure Laterality Date  . A/V FISTULAGRAM Right 06/06/2017   Procedure: A/V FISTULAGRAM;  Surgeon: Katha Cabal, MD;  Location: [place] CV LAB;  Service: Cardiovascular;  Laterality: Right;  . A/V SHUNT INTERVENTION N/A 06/06/2017   Procedure: A/V SHUNT INTERVENTION;  Surgeon: Katha Cabal, MD;  Location: [place] CV LAB;  Service: Cardiovascular;  Laterality: N/A;  . AGILE CAPSULE N/A 06/19/2016   Procedure: AGILE CAPSULE;  Surgeon: [name], MD;  Location: [facility] ENDOSCOPY;  Service: Endoscopy;  Laterality: N/A;  . COLONOSCOPY WITH PROPOFOL N/A 06/18/2016   Procedure: COLONOSCOPY WITH PROPOFOL;  Surgeon: [name], MD;  Location: [facility] ENDOSCOPY;  Service: Endoscopy;  Laterality: N/A;  . COLONOSCOPY WITH PROPOFOL N/A 08/12/2016   Procedure: COLONOSCOPY WITH PROPOFOL;  Surgeon: [name], MD;  Location: [facility] ENDOSCOPY;  Service: Endoscopy;  Laterality: N/A;  . COLONOSCOPY WITH PROPOFOL N/A 05/05/2017   Procedure: COLONOSCOPY WITH PROPOFOL;  Surgeon: Manya Silvas, MD;  Location: [facility] ENDOSCOPY;  Service: Endoscopy;  Laterality: N/A;  . CORONARY ANGIOPLASTY  ?   PT UNABLE TO TELL IF  BEFORE OR AFTER  CABG  . CORONARY ARTERY BYPASS GRAFT  2008  ([place])   3 VESSEL  . DIALYSIS FISTULA CREATION  LAST SURGERY  APPOX  2008  . ENTEROSCOPY N/A 05/10/2016   Procedure: ENTEROSCOPY;  Surgeon: Jerene Bears, MD;  Location: [place];  Service: Gastroenterology;  Laterality: N/A;  . ENTEROSCOPY N/A 08/12/2016   Procedure: ENTEROSCOPY;  Surgeon: [name], MD;  Location: [facility] ENDOSCOPY;  Service: Endoscopy;  Laterality: N/A;  . ENTEROSCOPY Left 06/03/2017   Procedure: ENTEROSCOPY;  Surgeon: Virgel Manifold, MD;  Location: [facility] ENDOSCOPY;  Service: Endoscopy;  Laterality: Left;  Procedure date will ultimately depend on when patient is medically optimized before the  procedure, pending hemodialysis and blood transfusions etc. Will place on schedule and change depending on clinical status.   . ENTEROSCOPY N/A 06/05/2017   Procedure: ENTEROSCOPY;  Surgeon: Virgel Manifold, MD;  Location: [facility] ENDOSCOPY;  Service: Endoscopy;  Laterality: N/A;  . ENTEROSCOPY N/A 06/15/2017   Procedure: Push ENTEROSCOPY;  Surgeon: [name], MD;  Location: [facility] ENDOSCOPY;  Service: Endoscopy;  Laterality: N/A;  . ESOPHAGOGASTRODUODENOSCOPY N/A 05/07/2015   Procedure: ESOPHAGOGASTRODUODENOSCOPY (EGD);  Surgeon: Hulen Luster, MD;  Location: [facility] ENDOSCOPY;  Service: Endoscopy;  Laterality: N/A;  . ESOPHAGOGASTRODUODENOSCOPY (EGD) WITH PROPOFOL N/A 05/17/2015   Procedure: ESOPHAGOGASTRODUODENOSCOPY (EGD) WITH PROPOFOL;  Surgeon: [name], MD;  Location: [facility] ENDOSCOPY;  Service: Endoscopy;  Laterality: N/A;  . ESOPHAGOGASTRODUODENOSCOPY (EGD) WITH PROPOFOL N/A 01/20/2016   Procedure: ESOPHAGOGASTRODUODENOSCOPY (EGD) WITH PROPOFOL;  Surgeon: [name], MD;  Location: [facility] ENDOSCOPY;  Service: Endoscopy;  Laterality: N/A;  . ESOPHAGOGASTRODUODENOSCOPY (EGD) WITH PROPOFOL N/A 04/17/2016   Procedure: ESOPHAGOGASTRODUODENOSCOPY (EGD) WITH PROPOFOL;  Surgeon: Lin Landsman, MD;  Location: [facility] ENDOSCOPY;  Service: Gastroenterology;  Laterality: N/A;  . ESOPHAGOGASTRODUODENOSCOPY (EGD) WITH PROPOFOL  05/09/2016  Procedure: ESOPHAGOGASTRODUODENOSCOPY (EGD) WITH PROPOFOL;  Surgeon: Jerene Bears, MD;  Location: [place];  Service: Endoscopy;;  . ESOPHAGOGASTRODUODENOSCOPY (EGD) WITH PROPOFOL N/A 06/16/2016   Procedure: ESOPHAGOGASTRODUODENOSCOPY (EGD) WITH PROPOFOL;  Surgeon: [name], MD;  Location: [facility] ENDOSCOPY;  Service: Endoscopy;  Laterality: N/A;  . ESOPHAGOGASTRODUODENOSCOPY (EGD) WITH PROPOFOL N/A 05/05/2017   Procedure: ESOPHAGOGASTRODUODENOSCOPY (EGD) WITH PROPOFOL;  Surgeon: Manya Silvas, MD;  Location: [facility] ENDOSCOPY;  Service: Endoscopy;  Laterality: N/A;  .  ESOPHAGOGASTRODUODENOSCOPY (EGD) WITH PROPOFOL N/A 06/15/2017   Procedure: ESOPHAGOGASTRODUODENOSCOPY (EGD) WITH PROPOFOL;  Surgeon: [name], MD;  Location: [facility] ENDOSCOPY;  Service: Endoscopy;  Laterality: N/A;  . ESOPHAGOGASTRODUODENOSCOPY (EGD) WITH PROPOFOL N/A 04/27/2018   Procedure: ESOPHAGOGASTRODUODENOSCOPY (EGD) WITH PROPOFOL;  Surgeon: [name], MD;  Location: [facility] ENDOSCOPY;  Service: Endoscopy;  Laterality: N/A;  . GIVENS CAPSULE STUDY N/A 05/07/2016   Procedure: GIVENS CAPSULE STUDY;  Surgeon: Doran Stabler, MD;  Location: [place];  Service: Endoscopy;  Laterality: N/A;  . LOWER EXTREMITY ANGIOGRAPHY Left 04/19/2018   Procedure: LOWER EXTREMITY ANGIOGRAPHY;  Surgeon: Algernon Huxley, MD;  Location: [place] CV LAB;  Service: Cardiovascular;  Laterality: Left;  Marland Kitchen MANDIBULAR HARDWARE REMOVAL N/A 07/29/2013   Procedure: REMOVAL OF ARCH BARS;  Surgeon: Theodoro Kos, DO;  Location: [place];  Service: Plastics;  Laterality: N/A;  . ORIF MANDIBULAR FRACTURE N/A 06/05/2013   Procedure: REPAIR OF MANDIBULAR FRACTURE x 2 with maxillo-mandibular fixation ;  Surgeon: Theodoro Kos, DO;  Location: [place];  Service: Plastics;  Laterality: N/A;  . PARACENTESIS    . PERIPHERAL ARTERIAL STENT GRAFT Left     Prior to Admission medications   Medication Sig Start Date End Date Taking? Authorizing Provider  acetaminophen (TYLENOL) 325 MG tablet Take 2 tablets (650 mg total) by mouth every 6 (six) hours as needed for mild pain (or Fever >/= 101). 05/07/18   Gouru, Aruna, MD  alum & mag hydroxide-simeth (MAALOX/MYLANTA) 200-200-20 MG/5ML suspension Take 30 mLs by mouth every 4 (four) hours as needed for indigestion. 05/07/18   Nicholes Mango, MD  aspirin EC 81 MG tablet Take 1 tablet (81 mg total) by mouth daily. 04/20/18 04/20/19  Epifanio Lesches, MD  budesonide-formoterol (SYMBICORT) 160-4.5 MCG/ACT inhaler Inhale 2 puffs into the lungs daily. 05/07/18   Nicholes Mango, MD    calcium acetate (PHOSLO) 667 MG capsule Take 2,001 mg by mouth 3 (three) times daily.    [provider]  docusate sodium (COLACE) 100 MG capsule Take 1 capsule (100 mg total) by mouth 2 (two) times daily as needed for mild constipation. 05/07/18   Nicholes Mango, MD  folic acid (FOLVITE) 1 MG tablet Take 1 tablet (1 mg total) by mouth daily. 10/31/17   Fritzi Mandes, MD  gabapentin (NEURONTIN) 300 MG capsule Take 300 mg by mouth 3 (three) times daily.  08/16/16   [provider]  ipratropium-albuterol (DUONEB) 0.5-2.5 (3) MG/3ML SOLN Take 3 mLs by nebulization every 6 (six) hours as needed. 05/07/18   Nicholes Mango, MD  labetalol (NORMODYNE) 100 MG tablet Take 100 mg by mouth daily.    [provider]  multivitamin (RENA-VIT) TABS tablet Take 1 tablet by mouth at bedtime. 10/31/17   Fritzi Mandes, MD  Nutritional Supplements (FEEDING SUPPLEMENT, NEPRO CARB STEADY,) LIQD Take 237 mLs by mouth 2 (two) times daily between meals. 05/07/18   Nicholes Mango, MD  omeprazole (PRILOSEC) 40 MG capsule Take 1 capsule (40 mg total) by mouth 2 (two) times  daily. 05/07/18   Nicholes Mango, MD  oxyCODONE (OXY IR/ROXICODONE) 5 MG immediate release tablet Take 1 tablet (5 mg total) by mouth every 6 (six) hours as needed for moderate pain. 05/07/18   Gouru, Illene Silver, MD  sucralfate (CARAFATE) 1 GM/10ML suspension Take 10 mLs (1 g total) by mouth 4 (four) times daily -  with meals and at bedtime. 05/07/18   Gouru, Illene Silver, MD  tiotropium (SPIRIVA HANDIHALER) 18 MCG inhalation capsule Place 1 capsule (18 mcg total) into inhaler and inhale daily. 05/07/18 08/05/18  Nicholes Mango, MD  vitamin C (VITAMIN C) 250 MG tablet Take 1 tablet (250 mg total) by mouth 2 (two) times daily. 04/20/18   Epifanio Lesches, MD    Allergies Patient has no known allergies.  Family History  Problem Relation Age of Onset  . Colon cancer Mother   . Cancer Father   . Cancer Sister   . Kidney disease Brother     Social  History Social History   Tobacco Use  . Smoking status: Current Every Day Smoker    Packs/day: 0.15    Years: 40.00    Pack years: 6.00    Types: Cigarettes  . Smokeless tobacco: Never Used  Substance Use Topics  . Alcohol use: No    Comment: pt reports quitting after learning about cirrhosis  . Drug use: No    Frequency: 7.0 times per week    Types: Marijuana, Cocaine    Review of Systems  Constitutional: No fever/chills Eyes: No visual changes. ENT: No sore throat. Cardiovascular: Denies chest pain. Respiratory: Denies shortness of breath. Gastrointestinal: No abdominal pain.  No nausea, no vomiting.  No diarrhea.  No constipation. Genitourinary: Negative for dysuria. Musculoskeletal: Negative for back pain. Skin: Negative for rash. Neurological: Negative for headaches, focal weakness   ____________________________________________   PHYSICAL EXAM:  VITAL SIGNS: ED Triage Vitals  Enc Vitals Group     BP 05/09/18 1009 (!) 111/59     Pulse Rate 05/09/18 1009 79     Resp 05/09/18 1009 18     Temp 05/09/18 1009 98.2 F (36.8 C)     Temp Source 05/09/18 1009 Oral     SpO2 05/09/18 1009 96 %     Weight 05/09/18 1005 160 lb (72.6 kg)     Height 05/09/18 1005 6\' 3"  (1.905 m)     Head Circumference --      Peak Flow --      Pain Score 05/09/18 1005 0     Pain Loc --      Pain Edu? --      Excl. in [place]? --     Constitutional: Alert and oriented. Well appearing and in no acute distress. Eyes: Conjunctivae are normal.  Head: Atraumatic. Nose: No congestion/rhinnorhea. Mouth/Throat: Mucous membranes are moist.  Oropharynx non-erythematous. Neck: No stridor.   Cardiovascular: Normal rate, regular rhythm. Grossly normal heart sounds.  Good peripheral circulation. Respiratory: Normal respiratory effort.  No retractions. Lungs CTAB. Gastrointestinal: Soft and nontender.  Some distention. No abdominal bruits. No CVA tenderness. Musculoskeletal: No lower extremity  tenderness nor edema.  No joint effusions. Neurologic:  Normal speech and language. No gross focal neurologic deficits are appreciated.  Skin:  Skin is warm, dry and intact. No rash noted.   ____________________________________________   LABS (all labs ordered are listed, but only abnormal results are displayed)  Labs Reviewed - No data to display ____________________________________________  EKG   ____________________________________________  RADIOLOGY  ED MD interpretation:  Official  radiology report(s): No results found.  ____________________________________________   PROCEDURES  Procedure(s) performed (including Critical Care):  Procedures   ____________________________________________   INITIAL IMPRESSION / ASSESSMENT AND PLAN / ED COURSE  We will see if we can get the patient downstairs for dialysis.         ____________________________________________   FINAL CLINICAL IMPRESSION(S) / ED DIAGNOSES  Final diagnoses:  Hemodialysis patient [facility])     ED Discharge Orders    None       Note:  This document was prepared using Dragon voice recognition software and may include unintentional dictation errors.    Nena Polio, MD 05/09/18 1019

## 2018-05-12 ENCOUNTER — Other Ambulatory Visit: Payer: Self-pay

## 2018-05-12 ENCOUNTER — Non-Acute Institutional Stay
Admission: EM | Admit: 2018-05-12 | Discharge: 2018-05-12 | Disposition: A | Discharge status: Home / Self Care | Payer: Medicare Other | Attending: Emergency Medicine | Admitting: Emergency Medicine

## 2018-05-12 ENCOUNTER — Encounter: Payer: Self-pay | Admitting: Emergency Medicine

## 2018-05-12 DIAGNOSIS — Q8781 Alport syndrome: Secondary | ICD-10-CM | POA: Insufficient documentation

## 2018-05-12 DIAGNOSIS — N2581 Secondary hyperparathyroidism of renal origin: Secondary | ICD-10-CM | POA: Diagnosis not present

## 2018-05-12 DIAGNOSIS — Z992 Dependence on renal dialysis: Secondary | ICD-10-CM | POA: Insufficient documentation

## 2018-05-12 DIAGNOSIS — R188 Other ascites: Secondary | ICD-10-CM | POA: Insufficient documentation

## 2018-05-12 DIAGNOSIS — I272 Pulmonary hypertension, unspecified: Secondary | ICD-10-CM | POA: Insufficient documentation

## 2018-05-12 DIAGNOSIS — I509 Heart failure, unspecified: Secondary | ICD-10-CM | POA: Insufficient documentation

## 2018-05-12 DIAGNOSIS — K7460 Unspecified cirrhosis of liver: Secondary | ICD-10-CM | POA: Insufficient documentation

## 2018-05-12 DIAGNOSIS — I132 Hypertensive heart and chronic kidney disease with heart failure and with stage 5 chronic kidney disease, or end stage renal disease: Secondary | ICD-10-CM | POA: Insufficient documentation

## 2018-05-12 DIAGNOSIS — D631 Anemia in chronic kidney disease: Secondary | ICD-10-CM | POA: Insufficient documentation

## 2018-05-12 DIAGNOSIS — I2510 Atherosclerotic heart disease of native coronary artery without angina pectoris: Secondary | ICD-10-CM | POA: Insufficient documentation

## 2018-05-12 DIAGNOSIS — E875 Hyperkalemia: Secondary | ICD-10-CM | POA: Insufficient documentation

## 2018-05-12 DIAGNOSIS — N186 End stage renal disease: Secondary | ICD-10-CM | POA: Insufficient documentation

## 2018-05-12 DIAGNOSIS — E785 Hyperlipidemia, unspecified: Secondary | ICD-10-CM | POA: Insufficient documentation

## 2018-05-12 LAB — RENAL FUNCTION PANEL
Albumin: 2.3 g/dL — ABNORMAL LOW (ref 3.5–5.0)
Anion gap: 10 (ref 5–15)
BUN: 43 mg/dL — ABNORMAL HIGH (ref 6–20)
CO2: 34 mmol/L — ABNORMAL HIGH (ref 22–32)
Calcium: 8.3 mg/dL — ABNORMAL LOW (ref 8.9–10.3)
Chloride: 94 mmol/L — ABNORMAL LOW (ref 98–111)
Creatinine, Ser: 6.47 mg/dL — ABNORMAL HIGH (ref 0.61–1.24)
GFR calc Af Amer: 10 mL/min — ABNORMAL LOW (ref >60)
GFR calc non Af Amer: 9 mL/min — ABNORMAL LOW (ref >60)
Glucose, Bld: 101 mg/dL — ABNORMAL HIGH (ref 70–99)
POTASSIUM: 5.7 mmol/L — AB (ref 3.5–5.1)
Phosphorus: 3.4 mg/dL (ref 2.5–4.6)
Sodium: 138 mmol/L (ref 135–145)

## 2018-05-12 LAB — DIFFERENTIAL
Abs Immature Granulocytes: 0.06 10*3/uL (ref 0.00–0.07)
Basophils Absolute: 0 10*3/uL (ref 0.0–0.1)
Basophils Relative: 0 %
Eosinophils Absolute: 0.1 10*3/uL (ref 0.0–0.5)
Eosinophils Relative: 1 %
Immature Granulocytes: 1 %
LYMPHS PCT: 7 %
Lymphs Abs: 0.7 10*3/uL (ref 0.7–4.0)
Monocytes Absolute: 0.7 10*3/uL (ref 0.1–1.0)
Monocytes Relative: 7 %
Neutro Abs: 7.5 10*3/uL (ref 1.7–7.7)
Neutrophils Relative %: 84 %

## 2018-05-12 LAB — CBC
HCT: 23.9 % — ABNORMAL LOW (ref 39.0–52.0)
Hemoglobin: 6.7 g/dL — ABNORMAL LOW (ref 13.0–17.0)
MCH: 25.6 pg — ABNORMAL LOW (ref 26.0–34.0)
MCHC: 28 g/dL — ABNORMAL LOW (ref 30.0–36.0)
MCV: 91.2 fL (ref 80.0–100.0)
Platelets: 403 10*3/uL — ABNORMAL HIGH (ref 150–400)
RBC: 2.62 MIL/uL — ABNORMAL LOW (ref 4.22–5.81)
RDW: 19.2 % — ABNORMAL HIGH (ref 11.5–15.5)
WBC: 9 10*3/uL (ref 4.0–10.5)
nRBC: 0 % (ref 0.0–0.2)

## 2018-05-12 MED ORDER — DIPHENHYDRAMINE HCL 50 MG/ML IJ SOLN
25.0000 mg | Freq: Once | INTRAMUSCULAR | Status: AC
  Administered 2018-05-12: 25 mg via INTRAVENOUS

## 2018-05-12 MED ORDER — EPOETIN ALFA 10000 UNIT/ML IJ SOLN
10000.0000 [IU] | Freq: Once | INTRAMUSCULAR | Status: AC
  Administered 2018-05-12: 10000 [IU] via SUBCUTANEOUS

## 2018-05-12 MED ORDER — MORPHINE SULFATE (PF) 2 MG/ML IV SOLN
2.0000 mg | Freq: Once | INTRAVENOUS | Status: AC
  Administered 2018-05-12: 2 mg via INTRAVENOUS
  Filled 2018-05-12: qty 1

## 2018-05-12 MED ORDER — CHLORHEXIDINE GLUCONATE CLOTH 2 % EX PADS: 6.0000 | MEDICATED_PAD | Freq: Every day | CUTANEOUS | Status: DC

## 2018-05-12 NOTE — Progress Notes (Signed)
[facility]  ROUNDING NOTE   Subjective:   Last hemodialysis was on 2/24. Patient has not been to his outpatient center for hemodialysis since 1/24.   Presents today with shortness of breath, left lower extremity pain and edema.  Placed on hemodialysis treatment. Tolerating treatment well. Seated in chair.     HEMODIALYSIS FLOWSHEET:  Blood Flow Rate (mL/min): 400 mL/min Arterial Pressure (mmHg): 150 mmHg Venous Pressure (mmHg): 130 mmHg Transmembrane Pressure (mmHg): 60 mmHg Ultrafiltration Rate (mL/min): 1290 mL/min Dialysate Flow Rate (mL/min): 600 ml/min Conductivity: Machine : 13.9 Conductivity: Machine : 13.9 Dialysis Fluid Bolus: Normal Saline Bolus Amount (mL): 250 mL    Objective:  Vital signs in last 24 hours:  Temp:  [97.8 F (36.6 C)-98.2 F (36.8 C)] 97.8 F (36.6 C) (02/29 0915) Pulse Rate:  [60-94] 88 (02/29 1130) Resp:  [14-24] 14 (02/29 1130) BP: (114-136)/(60-92) 125/60 (02/29 1130) SpO2:  [96 %-100 %] 100 % (02/29 1130) Weight:  [75.7 kg-75.8 kg] 75.7 kg (02/29 0915)  Weight change:  Filed Weights   05/12/18 0810 05/12/18 0915  Weight: 75.8 kg 75.7 kg    Intake/Output: No intake/output data recorded.   Intake/Output this shift:  No intake/output data recorded.  Physical Exam: General: NAD, sitting in chair  Head: Normocephalic, atraumatic. Moist oral mucosal membranes  Eyes: Anicteric, PERRL  Neck: Supple, trachea midline  Lungs:  Clear to auscultation  Heart: Regular rate and rhythm  Abdomen:  Soft, nontender,   Extremities: 1+ peripheral edema, right second toe erythema  Neurologic: Nonfocal, moving all four extremities  Skin: No lesions  Access: RIJ permcath    Basic Metabolic Panel: Recent Labs  Lab 05/05/18 1831 05/06/18 0436 05/07/18 0600 05/07/18 1447  NA  --  136 135  --   K 4.6 5.1 6.4* 4.0  CL  --  99 97*  --   CO2  --  28 27  --   GLUCOSE  --  97 89  --   BUN  --  41* 58*  --   CREATININE  --   5.22* 6.48*  --   CALCIUM  --  7.6* 8.2*  --     Liver Function Tests: Recent Labs  Lab 05/06/18 0436  AST 18  ALT 8  ALKPHOS 82  BILITOT 0.6  PROT 5.7*  ALBUMIN 1.9*   No results for input(s): LIPASE, AMYLASE in the last 168 hours. No results for input(s): AMMONIA in the last 168 hours.  CBC: Recent Labs  Lab 05/05/18 1339 05/06/18 0436 05/07/18 0600  WBC  --  8.0 10.5  HGB 6.2* 7.0* 7.8*  HCT 20.8* 23.7* 26.1*  MCV  --  85.6 87.6  PLT  --  349 403*    Cardiac Enzymes: No results for input(s): CKTOTAL, CKMB, CKMBINDEX, TROPONINI in the last 168 hours.  BNP: Invalid input(s): POCBNP  CBG: No results for input(s): GLUCAP in the last 168 hours.  Microbiology: Results for orders placed or performed during the hospital encounter of 04/26/18  MRSA PCR Screening     Status: None   Collection Time: 04/26/18  1:09 PM  Result Value Ref Range Status   MRSA by PCR NEGATIVE NEGATIVE Final    Comment:        The GeneXpert MRSA Assay (FDA approved for NASAL specimens only), is one component of a comprehensive MRSA colonization surveillance program. It is not intended to diagnose MRSA infection nor to guide or monitor treatment for MRSA infections. Performed at [facility], [place], [place] [address]     Coagulation Studies: No results for input(s): LABPROT, INR in the last 72 hours.  Urinalysis: No results for input(s): COLORURINE, LABSPEC, PHURINE, GLUCOSEU, HGBUR, BILIRUBINUR, KETONESUR, PROTEINUR, UROBILINOGEN, NITRITE, LEUKOCYTESUR in the last 72 hours.  Invalid input(s): APPERANCEUR    Imaging: No results found.   Medications:    . Chlorhexidine Gluconate Cloth  6 each Topical Q0600  .  morphine injection  2 mg Intravenous Once     Assessment/ Plan:  [name] is a 58 y.o. black male withend stage renal disease on hemodialysis secondary to Alport's syndrome, end stage liver disease with ascites,  hypertension, peripheral vascular disease, coronary artery disease, hyperlipidemia, gastrointestinal AVMs, pulmonary hypertension  [facility] ([facility]) RIJ permcath.210 min, currently no transportation to dialysis unit  1. Hyperkalemia - 2 K bath on hemodialysis  2. End Stage Renal Disease: on hemodialysis. Unable to get to his outpatient clinic. Unfortunately no other local clinic is willing to accept patient at this time. Patient is now getting his dialysis at [facility].  Seen and examined on hemodialysis treatment. Tolerating treatment well.  3. Anemia of chronic kidney disease: with multiple GI bleeds.  - EPO with HD treatment.   4. Secondary Hyperparathyroidism: PTH 597 on 03/26/18. Phosphorus 3.9 on 2/22. Calcium 8.2 on 2/24.   5. Ascites: ultrasound guided paracentesis on 2/14 yeilding 7.3 liters of fluid   LOS: 0 Nur Rabold 2/29/202011:49 AM

## 2018-05-12 NOTE — ED Triage Notes (Signed)
Here for scheduled dialysis

## 2018-05-12 NOTE — Progress Notes (Signed)
HD tx start    05/12/18 0922  Vital Signs  Pulse Rate 88  Pulse Rate Source Monitor  Resp 18  BP 119/76  BP Location Left Arm  BP Method Automatic  Patient Position (if appropriate) Sitting  Oxygen Therapy  SpO2 100 %  O2 Device Room Air  During Hemodialysis Assessment  Blood Flow Rate (mL/min) 400 mL/min  Arterial Pressure (mmHg) -150 mmHg  Venous Pressure (mmHg) 130 mmHg  Transmembrane Pressure (mmHg) 60 mmHg  Ultrafiltration Rate (mL/min) 1290 mL/min  Dialysate Flow Rate (mL/min) 600 ml/min  Conductivity: Machine  13.8  HD Safety Checks Performed Yes  Dialysis Fluid Bolus Normal Saline  Bolus Amount (mL) 250 mL  Intra-Hemodialysis Comments Tx initiated  Hemodialysis Catheter Right Internal jugular Double-lumen  No Placement Date or Time found.   Placed prior to admission: Yes  Orientation: Right  Access Location: Internal jugular  Hemodialysis Catheter Type: Double-lumen  Blue Lumen Status Infusing  Red Lumen Status Infusing

## 2018-05-12 NOTE — Progress Notes (Signed)
Pre HD assessment   05/12/18 0915  Vital Signs  Temp 97.8 F (36.6 C)  Temp Source Oral  Pulse Rate 94  Pulse Rate Source Monitor  Resp 17  BP 128/80  BP Location Left Arm  BP Method Automatic  Patient Position (if appropriate) Sitting  Oxygen Therapy  SpO2 96 %  O2 Device Room Air  Pain Assessment  Pain Scale 0-10  Pain Score 8  Pain Location Foot  Pain Orientation Left  Pain Intervention(s) RN made aware  Dialysis Weight  Weight 75.7 kg  Type of Weight Pre-Dialysis  Time-Out for Hemodialysis  What Procedure? HD  Pt Identifiers(min of two) First/Last Name;MRN/Account#  Correct Site? Yes  Correct Side? Yes  Correct Procedure? Yes  Consents Verified? Yes  Rad Studies Available? N/A  Safety Precautions Reviewed? Yes  Engineer, civil (consulting) Number  ([id number])  Station Number 3  UF/Alarm Test Passed  Conductivity: Meter 13.8  Conductivity: Machine  13.9  pH 7.4  Reverse Osmosis main  Normal Saline Lot Number [id number]  Dialyzer Lot Number [id number]  Disposable Set Lot Number [id number]  Machine Temperature 98.6 F (37 C)  Musician and Audible Yes  Blood Lines Intact and Secured Yes  Pre Treatment Patient Checks  Vascular access used during treatment Catheter  Patient is receiving dialysis in a chair Yes  Hepatitis B Surface Antigen Results Negative  Date Hepatitis B Surface Antigen Drawn 06/16/17  Hepatitis B Surface Antibody  (>10)  Date Hepatitis B Surface Antibody Drawn 06/16/17  Hemodialysis Consent Verified Yes  Hemodialysis Standing Orders Initiated Yes  ECG (Telemetry) Monitor On Yes  Prime Ordered Normal Saline  Length of  DialysisTreatment -hour(s) 3.5 Hour(s)  Dialyzer Elisio 17H NR  Dialysate 2K, 2.5 Ca  Dialysis Anticoagulant None  Dialysate Flow Ordered 600  Blood Flow Rate Ordered 400 mL/min  Ultrafiltration Goal 4 Liters  Pre Treatment Labs Renal panel;CBC;Differential  Dialysis Blood Pressure Support Ordered Normal Saline  Education /  Care Plan  Dialysis Education Provided Yes  Documented Education in Care Plan Yes  Hemodialysis Catheter Right Internal jugular Double-lumen  No Placement Date or Time found.   Placed prior to admission: Yes  Orientation: Right  Access Location: Internal jugular  Hemodialysis Catheter Type: Double-lumen  Site Condition No complications  Dressing Type Biopatch  Dressing Status Clean;Dry;Intact  Drainage Description None

## 2018-05-12 NOTE — ED Notes (Signed)
Pt arrived back from dialysis screaming and cussing in hall on phone.  Requested patient not yell and cuss as he is outside room of child and not appropriate behavior for ED.  Patient got upset and stormed out of ED.  Patient did not receive discharge papers or sign prior to leaving. Dr Cinda Quest at bedside when patient leaving and is aware.

## 2018-05-12 NOTE — ED Provider Notes (Signed)
Patient returns after dialysis.  He feels better.  His lungs are clear.  His heart is regular rate and rhythm no audible murmurs.  He is not short of breath he has no edema in his legs.  He wants to go very badly and I will let him go.  He is very hungry he has not eaten.   Nena Polio, MD 05/12/18 1332

## 2018-05-12 NOTE — ED Provider Notes (Signed)
[facility] Emergency Department Provider Note   ____________________________________________   First MD Initiated Contact with Patient 05/12/18 [phone]     (approximate)  I have reviewed the triage vital signs and the nursing notes.   HISTORY  Chief Complaint dialysis    HPI Stanley [name] is a 58 y.o. male who is here for his dialysis.  He also is complaining some pain in his foot he feels his pulse down there.  He had a recent stent done because he had arterial obstruction.  The foot is now warm and pink little bit swollen after having a stent.  Looks good though.  Patient has no other complaints.  Past Medical History:  Diagnosis Date  . Alcohol abuse   . CHF (congestive heart failure) ([place])   . Cirrhosis ([place])   . Coronary artery disease 2009  . Drug abuse ([place])   . End stage renal disease on dialysis [facility]) NEPHROLOGIST-   DR [facility]  IN [place]   HEMODIALYSIS --   TUES/  THURS/  SAT  . Gastrointestinal bleed 06/13/2017   From chart...hx of multiple GI bleeds  . GERD (gastroesophageal reflux disease)   . Hyperlipidemia   . Hypertension   . PAD (peripheral artery disease) ([place])   . Renal insufficiency    Per pt, 32 oz fluid restriction per day  . S/P triple vessel bypass 06/09/2016   2009ish  . Suicidal ideation    & HOMICIDAL IDEATION --  06-16-2013   ADMITTED TO BEHAVIOR HEALTH    Patient Active Problem List   Diagnosis Date Noted  . PAD (peripheral artery disease) ([place]) 04/14/2018  . Healthcare-associated pneumonia 01/15/2018  . Acute on chronic respiratory failure ([place]) 01/15/2018  . Acute pulmonary edema ([name]) 12/23/2017  . Acute respiratory failure ([place]) 10/29/2017  . ESRD (end stage renal disease) on dialysis ([place]) 07/28/2017  . Protein-calorie malnutrition, severe 06/14/2017  . Encounter for dialysis [facility])   . Palliative care by specialist   . Goals of care, counseling/discussion   . Malnutrition of moderate degree  06/05/2017  . Secondary esophageal varices without bleeding ([place])   . Stomach irritation   . Idiopathic esophageal varices without bleeding ([place])   . Alcoholic hepatitis with ascites 05/24/2017  . ESRD (end stage renal disease) ([place]) 04/28/2017  . Uremia 03/08/2017  . ESRD on hemodialysis ([place]) 03/03/2017  . Weakness 02/28/2017  . Hypocalcemia 02/22/2017  . Shortness of breath 11/26/2016  . COPD (chronic obstructive pulmonary disease) ([place]) 10/30/2016  . COPD exacerbation ([place]) 10/29/2016  . Anemia   . Heme positive stool   . Ulceration of intestine   . Benign neoplasm of transverse colon   . Acute gastrointestinal hemorrhage   . Esophageal candidiasis ([place])   . Angiodysplasia of intestinal tract   . Acute respiratory failure with hypoxia ([place]) 07/03/2016  . GI bleeding 06/24/2016  . Rectal bleeding 06/14/2016  . Anemia of chronic disease 06/01/2016  . MRSA carrier 06/01/2016  . Chronic renal failure 05/23/2016  . Ischemic heart disease 05/23/2016  . Angiodysplasia of small intestine   . Melena   . Small bowel bleed not requiring more than 4 units of blood in 24 hours, ICU, or surgery   . Anemia due to chronic blood loss   . Abdominal pain 05/05/2016  . Acute posthemorrhagic anemia 04/17/2016  . Gastrointestinal bleed 04/17/2016  . History of esophagogastroduodenoscopy (EGD) 04/17/2016  . Elevated troponin 04/17/2016  . Alcohol abuse 04/17/2016  . Upper GI bleed 01/19/2016  .  Blood in stool   . Angiodysplasia of stomach and duodenum with hemorrhage   . Gastritis   . Reflux esophagitis   . GI bleed 05/16/2015  . Acute GI bleeding   . Symptomatic anemia 04/30/2015  . HTN (hypertension) 04/06/2015  . GERD (gastroesophageal reflux disease) 04/06/2015  . HLD (hyperlipidemia) 04/06/2015  . Dyspnea 04/06/2015  . Cirrhosis of liver with ascites ([place]) 04/06/2015  . Ascites 04/06/2015  . GIB (gastrointestinal bleeding) 03/23/2015  . Homicidal ideation 06/19/2013  . Suicidal  intent 06/19/2013  . Homicidal ideations 06/19/2013  . Hyperkalemia 06/16/2013  . Mandible fracture ([place]) 06/05/2013  . Fracture, mandible ([place]) 06/02/2013  . Coronary atherosclerosis of native coronary artery 06/02/2013  . ESRD on dialysis ([place]) 06/02/2013  . Mandible open fracture ([place]) 06/02/2013    Past Surgical History:  Procedure Laterality Date  . A/V FISTULAGRAM Right 06/06/2017   Procedure: A/V FISTULAGRAM;  Surgeon: Katha Cabal, MD;  Location: [place] CV LAB;  Service: Cardiovascular;  Laterality: Right;  . A/V SHUNT INTERVENTION N/A 06/06/2017   Procedure: A/V SHUNT INTERVENTION;  Surgeon: Katha Cabal, MD;  Location: [place] CV LAB;  Service: Cardiovascular;  Laterality: N/A;  . AGILE CAPSULE N/A 06/19/2016   Procedure: AGILE CAPSULE;  Surgeon: [name], MD;  Location: [facility] ENDOSCOPY;  Service: Endoscopy;  Laterality: N/A;  . COLONOSCOPY WITH PROPOFOL N/A 06/18/2016   Procedure: COLONOSCOPY WITH PROPOFOL;  Surgeon: [name], MD;  Location: [facility] ENDOSCOPY;  Service: Endoscopy;  Laterality: N/A;  . COLONOSCOPY WITH PROPOFOL N/A 08/12/2016   Procedure: COLONOSCOPY WITH PROPOFOL;  Surgeon: [name], MD;  Location: [facility] ENDOSCOPY;  Service: Endoscopy;  Laterality: N/A;  . COLONOSCOPY WITH PROPOFOL N/A 05/05/2017   Procedure: COLONOSCOPY WITH PROPOFOL;  Surgeon: Manya Silvas, MD;  Location: [facility] ENDOSCOPY;  Service: Endoscopy;  Laterality: N/A;  . CORONARY ANGIOPLASTY  ?   PT UNABLE TO TELL IF  BEFORE OR AFTER  CABG  . CORONARY ARTERY BYPASS GRAFT  2008  ([place])   3 VESSEL  . DIALYSIS FISTULA CREATION  LAST SURGERY  APPOX  2008  . ENTEROSCOPY N/A 05/10/2016   Procedure: ENTEROSCOPY;  Surgeon: Jerene Bears, MD;  Location: [place];  Service: Gastroenterology;  Laterality: N/A;  . ENTEROSCOPY N/A 08/12/2016   Procedure: ENTEROSCOPY;  Surgeon: [name], MD;  Location: [facility] ENDOSCOPY;  Service: Endoscopy;  Laterality: N/A;  . ENTEROSCOPY  Left 06/03/2017   Procedure: ENTEROSCOPY;  Surgeon: Virgel Manifold, MD;  Location: [facility] ENDOSCOPY;  Service: Endoscopy;  Laterality: Left;  Procedure date will ultimately depend on when patient is medically optimized before the procedure, pending hemodialysis and blood transfusions etc. Will place on schedule and change depending on clinical status.   . ENTEROSCOPY N/A 06/05/2017   Procedure: ENTEROSCOPY;  Surgeon: Virgel Manifold, MD;  Location: [facility] ENDOSCOPY;  Service: Endoscopy;  Laterality: N/A;  . ENTEROSCOPY N/A 06/15/2017   Procedure: Push ENTEROSCOPY;  Surgeon: [name], MD;  Location[facility] ENDOSCOPY;  Service: Endoscopy;  Laterality: N/A;  . ESOPHAGOGASTRODUODENOSCOPY N/A 05/07/2015   Procedure: ESOPHAGOGASTRODUODENOSCOPY (EGD);  Surgeon: Hulen Luster, MD;  [facility] ENDOSCOPY;  Service: Endoscopy;  Laterality: N/A;  . ESOPHAGOGASTRODUODENOSCOPY (EGD) WITH PROPOFOL N/A 05/17/2015   Procedure: ESOPHAGOGASTRODUODENOSCOPY (EGD) WITH PROPOFOL;  Surgeon: [name], MD;  Location: [facility] ENDOSCOPY;  Service: Endoscopy;  Laterality: N/A;  . ESOPHAGOGASTRODUODENOSCOPY (EGD) WITH PROPOFOL N/A 01/20/2016   Procedure: ESOPHAGOGASTRODUODENOSCOPY (EGD) WITH PROPOFOL;  Surgeon: [name], MD;  Location: [facility] ENDOSCOPY;  Service: Endoscopy;  Laterality: N/A;  . ESOPHAGOGASTRODUODENOSCOPY (EGD) WITH PROPOFOL N/A 04/17/2016   Procedure: ESOPHAGOGASTRODUODENOSCOPY (EGD) WITH PROPOFOL;  Surgeon: Lin Landsman, MD;  Location: [facility] ENDOSCOPY;  Service: Gastroenterology;  Laterality: N/A;  . ESOPHAGOGASTRODUODENOSCOPY (EGD) WITH PROPOFOL  05/09/2016   Procedure: ESOPHAGOGASTRODUODENOSCOPY (EGD) WITH PROPOFOL;  Surgeon: Jerene Bears, MD;  Location: [place];  Service: Endoscopy;;  . ESOPHAGOGASTRODUODENOSCOPY (EGD) WITH PROPOFOL N/A 06/16/2016   Procedure: ESOPHAGOGASTRODUODENOSCOPY (EGD) WITH PROPOFOL;  Surgeon: [name], MD;  Location: [facility] ENDOSCOPY;  Service: Endoscopy;  Laterality: N/A;  .  ESOPHAGOGASTRODUODENOSCOPY (EGD) WITH PROPOFOL N/A 05/05/2017   Procedure: ESOPHAGOGASTRODUODENOSCOPY (EGD) WITH PROPOFOL;  Surgeon: Manya Silvas, MD;  Location: [facility] ENDOSCOPY;  Service: Endoscopy;  Laterality: N/A;  . ESOPHAGOGASTRODUODENOSCOPY (EGD) WITH PROPOFOL N/A 06/15/2017   Procedure: ESOPHAGOGASTRODUODENOSCOPY (EGD) WITH PROPOFOL;  Surgeon: [name], MD;  Location: [facility] ENDOSCOPY;  Service: Endoscopy;  Laterality: N/A;  . ESOPHAGOGASTRODUODENOSCOPY (EGD) WITH PROPOFOL N/A 04/27/2018   Procedure: ESOPHAGOGASTRODUODENOSCOPY (EGD) WITH PROPOFOL;  Surgeon: [name], MD;  Location: [facility] ENDOSCOPY;  Service: Endoscopy;  Laterality: N/A;  . GIVENS CAPSULE STUDY N/A 05/07/2016   Procedure: GIVENS CAPSULE STUDY;  Surgeon: Doran Stabler, MD;  Location: [place];  Service: Endoscopy;  Laterality: N/A;  . LOWER EXTREMITY ANGIOGRAPHY Left 04/19/2018   Procedure: LOWER EXTREMITY ANGIOGRAPHY;  Surgeon: Algernon Huxley, MD;  Location: [place] CV LAB;  Service: Cardiovascular;  Laterality: Left;  Marland Kitchen MANDIBULAR HARDWARE REMOVAL N/A 07/29/2013   Procedure: REMOVAL OF ARCH BARS;  Surgeon: Theodoro Kos, DO;  Location: [place];  Service: Plastics;  Laterality: N/A;  . ORIF MANDIBULAR FRACTURE N/A 06/05/2013   Procedure: REPAIR OF MANDIBULAR FRACTURE x 2 with maxillo-mandibular fixation ;  Surgeon: Theodoro Kos, DO;  Location: [place];  Service: Plastics;  Laterality: N/A;  . PARACENTESIS    . PERIPHERAL ARTERIAL STENT GRAFT Left     Prior to Admission medications   Medication Sig Start Date End Date Taking? Authorizing Provider  acetaminophen (TYLENOL) 325 MG tablet Take 2 tablets (650 mg total) by mouth every 6 (six) hours as needed for mild pain (or Fever >/= 101). 05/07/18   Gouru, Aruna, MD  alum & mag hydroxide-simeth (MAALOX/MYLANTA) 200-200-20 MG/5ML suspension Take 30 mLs by mouth every 4 (four) hours as needed for indigestion. 05/07/18   Nicholes Mango, MD  aspirin EC  81 MG tablet Take 1 tablet (81 mg total) by mouth daily. 04/20/18 04/20/19  Epifanio Lesches, MD  budesonide-formoterol (SYMBICORT) 160-4.5 MCG/ACT inhaler Inhale 2 puffs into the lungs daily. 05/07/18   Nicholes Mango, MD  calcium acetate (PHOSLO) 667 MG capsule Take 2,001 mg by mouth 3 (three) times daily.    [provider]  docusate sodium (COLACE) 100 MG capsule Take 1 capsule (100 mg total) by mouth 2 (two) times daily as needed for mild constipation. 05/07/18   Nicholes Mango, MD  folic acid (FOLVITE) 1 MG tablet Take 1 tablet (1 mg total) by mouth daily. 10/31/17   Fritzi Mandes, MD  gabapentin (NEURONTIN) 300 MG capsule Take 300 mg by mouth 3 (three) times daily.  08/16/16   [provider]  ipratropium-albuterol (DUONEB) 0.5-2.5 (3) MG/3ML SOLN Take 3 mLs by nebulization every 6 (six) hours as needed. 05/07/18   Nicholes Mango, MD  labetalol (NORMODYNE) 100 MG tablet Take 100 mg by mouth daily.    [provider]  multivitamin (RENA-VIT) TABS tablet Take 1 tablet by mouth at bedtime. 10/31/17   Posey Pronto,  Sona, MD  Nutritional Supplements (FEEDING SUPPLEMENT, NEPRO CARB STEADY,) LIQD Take 237 mLs by mouth 2 (two) times daily between meals. 05/07/18   Nicholes Mango, MD  omeprazole (PRILOSEC) 40 MG capsule Take 1 capsule (40 mg total) by mouth 2 (two) times daily. 05/07/18   Gouru, Illene Silver, MD  oxyCODONE (OXY IR/ROXICODONE) 5 MG immediate release tablet Take 1 tablet (5 mg total) by mouth every 6 (six) hours as needed for moderate pain. 05/07/18   Gouru, Illene Silver, MD  sucralfate (CARAFATE) 1 GM/10ML suspension Take 10 mLs (1 g total) by mouth 4 (four) times daily -  with meals and at bedtime. 05/07/18   Gouru, Illene Silver, MD  tiotropium (SPIRIVA HANDIHALER) 18 MCG inhalation capsule Place 1 capsule (18 mcg total) into inhaler and inhale daily. 05/07/18 08/05/18  Nicholes Mango, MD  vitamin C (VITAMIN C) 250 MG tablet Take 1 tablet (250 mg total) by mouth 2 (two) times daily. 04/20/18   Epifanio Lesches,  MD    Allergies Patient has no known allergies.  Family History  Problem Relation Age of Onset  . Colon cancer Mother   . Cancer Father   . Cancer Sister   . Kidney disease Brother     Social History Social History   Tobacco Use  . Smoking status: Current Every Day Smoker    Packs/day: 0.15    Years: 40.00    Pack years: 6.00    Types: Cigarettes  . Smokeless tobacco: Never Used  Substance Use Topics  . Alcohol use: No    Comment: pt reports quitting after learning about cirrhosis  . Drug use: No    Frequency: 7.0 times per week    Types: Marijuana, Cocaine    Review of Systems  Constitutional: No fever/chills Eyes: No visual changes. ENT: No sore throat. Cardiovascular: Denies chest pain. Respiratory: Denies shortness of breath. Gastrointestinal: No abdominal pain.  No nausea, no vomiting.  No diarrhea.  No constipation. Genitourinary: Negative for dysuria. Musculoskeletal: Negative for back pain. Skin: Negative for rash. Neurological: Negative for headaches, focal weakness   ____________________________________________   PHYSICAL EXAM:  VITAL SIGNS: ED Triage Vitals  Enc Vitals Group     BP 05/12/18 0807 114/75     Pulse Rate 05/12/18 0807 86     Resp 05/12/18 0807 18     Temp 05/12/18 0807 98.2 F (36.8 C)     Temp Source 05/12/18 0807 Oral     SpO2 05/12/18 0807 96 %     Weight 05/12/18 0810 167 lb (75.8 kg)     Height 05/12/18 0810 6\' 3"  (1.905 m)     Head Circumference --      Peak Flow --      Pain Score 05/12/18 0809 9     Pain Loc --      Pain Edu? --      Excl. in [place]? --     Constitutional: Alert and oriented. A Little unhappy about his foot pain. Eyes: Conjunctivae are normal.  Head: Atraumatic. Nose: No congestion/rhinnorhea. Mouth/Throat: Mucous membranes are moist.  Oropharynx non-erythematous. Neck: No stridor.  Cardiovascular: Normal rate, regular rhythm. Grossly normal heart sounds.  Good peripheral  circulation. Respiratory: Normal respiratory effort.  No retractions. Lungs CTAB. Gastrointestinal: Soft and nontender. No distention. No abdominal bruits.  Musculoskeletal: No lower extremity tenderness nor edema.  No joint effusions. Neurologic:  Normal speech and language. No gross focal neurologic deficits are appreciated. No gait instability. Skin:  Skin is warm, dry and intact.  No rash noted.   ____________________________________________   LABS (all labs ordered are listed, but only abnormal results are displayed)  Labs Reviewed - No data to display ____________________________________________  EKG   ____________________________________________  RADIOLOGY  ED MD interpretation:    Official radiology report(s): No results found.  ____________________________________________   PROCEDURES  Procedure(s) performed (including Critical Care):  Procedures   ____________________________________________   INITIAL IMPRESSION / ASSESSMENT AND PLAN / ED COURSE            ____________________________________________   FINAL CLINICAL IMPRESSION(S) / ED DIAGNOSES  Final diagnoses:  Hemodialysis patient [facility])     ED Discharge Orders    None       Note:  This document was prepared using Dragon voice recognition software and may include unintentional dictation errors.    Nena Polio, MD 05/12/18 [phone]

## 2018-05-12 NOTE — Progress Notes (Signed)
Post HD assessment    05/12/18 1308  Neurological  Level of Consciousness Alert  Orientation Level Oriented X4  Respiratory  Respiratory Pattern Regular;Unlabored  Chest Assessment Chest expansion symmetrical  Cough Non-productive  Cardiac  Pulse Regular  ECG Monitor Yes  Cardiac Rhythm NSR  Ectopy Unifocal PVC's  Ectopy Frequency Frequent  Vascular  R Radial Pulse +2  L Radial Pulse +2  Edema Generalized  Integumentary  Integumentary (WDL) X  Skin Color Appropriate for ethnicity  Musculoskeletal  Musculoskeletal (WDL) X  Generalized Weakness Yes  Assistive Device None  GU Assessment  Genitourinary (WDL) X  Genitourinary Symptoms  (HD)  Psychosocial  Psychosocial (WDL) X  Patient Behaviors Agitated;Irritable;Aggressive verbally

## 2018-05-12 NOTE — Progress Notes (Signed)
HD tx end    05/12/18 1302  Vital Signs  Pulse Rate 86  Pulse Rate Source Monitor  Resp 15  BP (!) 123/93  BP Location Left Arm  BP Method Automatic  Patient Position (if appropriate) Sitting  Oxygen Therapy  SpO2 100 %  O2 Device Room Air  During Hemodialysis Assessment  Dialysis Fluid Bolus Normal Saline  Bolus Amount (mL) 250 mL  Intra-Hemodialysis Comments Tx completed

## 2018-05-12 NOTE — ED Notes (Signed)
Dr Cinda Quest at bedside.

## 2018-05-12 NOTE — ED Notes (Signed)
Report to [facility] in dialysis. Pt transported to dialysis with tech in wheelchair.

## 2018-05-12 NOTE — ED Notes (Signed)
Pt here for scheduled dialysis.  Last dialysis Monday. No complaints other than some pain to left foot from recent surgery.

## 2018-05-12 NOTE — Progress Notes (Signed)
Post HD assessment. Pt tolerated tx well without c/o or complication. Net UF 4008, goal met.    05/12/18 1309  Hand-Off documentation  Report given to (Full Name) Larose Kells  Report received from (Full Name) Stark Bray  Vital Signs  Temp 97.9 F (36.6 C)  Temp Source Oral  Pulse Rate 86  Pulse Rate Source Monitor  Resp 15  BP 138/65  BP Location Left Arm  BP Method Automatic  Patient Position (if appropriate) Sitting  Oxygen Therapy  SpO2 100 %  O2 Device Room Air  Dialysis Weight  Weight 74.7 kg  Type of Weight Post-Dialysis  Post-Hemodialysis Assessment  Rinseback Volume (mL) 250 mL  KECN 80 V  Dialyzer Clearance Lightly streaked  Duration of HD Treatment -hour(s) 3.5 hour(s)  Hemodialysis Intake (mL) 500 mL  UF Total -Machine (mL) 4508 mL  Net UF (mL) 4008 mL  Tolerated HD Treatment Yes  Education / Care Plan  Dialysis Education Provided Yes  Documented Education in Care Plan Yes  Hemodialysis Catheter Right Internal jugular Double-lumen  No Placement Date or Time found.   Placed prior to admission: Yes  Orientation: Right  Access Location: Internal jugular  Hemodialysis Catheter Type: Double-lumen  Site Condition No complications  Blue Lumen Status Heparin locked  Red Lumen Status Heparin locked  Purple Lumen Status N/A  Catheter fill solution Heparin 1000 units/ml  Catheter fill volume (Arterial) 1.5 cc  Catheter fill volume (Venous) 1.5  Dressing Type Biopatch  Dressing Status Dressing changed;Clean;Dry;Intact  Interventions New dressing  Drainage Description None  Post treatment catheter status Capped and Clamped

## 2018-05-12 NOTE — Progress Notes (Signed)
Pre HD assessment    05/12/18 0916  Neurological  Level of Consciousness Alert  Orientation Level Oriented X4  Respiratory  Respiratory Pattern Regular;Tachypnea  Cough Non-productive  Cardiac  Pulse Regular  ECG Monitor Yes  Cardiac Rhythm NSR  Vascular  R Radial Pulse +2  L Radial Pulse +2  Edema Generalized  Integumentary  Integumentary (WDL) X  Skin Color Appropriate for ethnicity  Musculoskeletal  Musculoskeletal (WDL) X  Generalized Weakness Yes  Assistive Device None  GU Assessment  Genitourinary (WDL) X  Genitourinary Symptoms  (HD)  Psychosocial  Psychosocial (WDL) WDL

## 2018-05-16 ENCOUNTER — Other Ambulatory Visit: Payer: Self-pay

## 2018-05-16 ENCOUNTER — Non-Acute Institutional Stay
Admission: EM | Admit: 2018-05-16 | Discharge: 2018-05-17 | Disposition: A | Discharge status: Home / Self Care | Payer: Medicare Other | Attending: Nephrology | Admitting: Nephrology

## 2018-05-16 ENCOUNTER — Encounter: Payer: Self-pay | Admitting: Emergency Medicine

## 2018-05-16 DIAGNOSIS — I272 Pulmonary hypertension, unspecified: Secondary | ICD-10-CM | POA: Diagnosis not present

## 2018-05-16 DIAGNOSIS — I120 Hypertensive chronic kidney disease with stage 5 chronic kidney disease or end stage renal disease: Secondary | ICD-10-CM | POA: Diagnosis not present

## 2018-05-16 DIAGNOSIS — J449 Chronic obstructive pulmonary disease, unspecified: Secondary | ICD-10-CM | POA: Insufficient documentation

## 2018-05-16 DIAGNOSIS — Z79899 Other long term (current) drug therapy: Secondary | ICD-10-CM | POA: Diagnosis not present

## 2018-05-16 DIAGNOSIS — N2581 Secondary hyperparathyroidism of renal origin: Secondary | ICD-10-CM | POA: Insufficient documentation

## 2018-05-16 DIAGNOSIS — K219 Gastro-esophageal reflux disease without esophagitis: Secondary | ICD-10-CM | POA: Insufficient documentation

## 2018-05-16 DIAGNOSIS — Z951 Presence of aortocoronary bypass graft: Secondary | ICD-10-CM | POA: Diagnosis not present

## 2018-05-16 DIAGNOSIS — I2510 Atherosclerotic heart disease of native coronary artery without angina pectoris: Secondary | ICD-10-CM | POA: Insufficient documentation

## 2018-05-16 DIAGNOSIS — Z7951 Long term (current) use of inhaled steroids: Secondary | ICD-10-CM | POA: Diagnosis not present

## 2018-05-16 DIAGNOSIS — Z7982 Long term (current) use of aspirin: Secondary | ICD-10-CM | POA: Diagnosis not present

## 2018-05-16 DIAGNOSIS — K7011 Alcoholic hepatitis with ascites: Secondary | ICD-10-CM | POA: Insufficient documentation

## 2018-05-16 DIAGNOSIS — K7460 Unspecified cirrhosis of liver: Secondary | ICD-10-CM | POA: Insufficient documentation

## 2018-05-16 DIAGNOSIS — E785 Hyperlipidemia, unspecified: Secondary | ICD-10-CM | POA: Diagnosis not present

## 2018-05-16 DIAGNOSIS — F17210 Nicotine dependence, cigarettes, uncomplicated: Secondary | ICD-10-CM | POA: Insufficient documentation

## 2018-05-16 DIAGNOSIS — I132 Hypertensive heart and chronic kidney disease with heart failure and with stage 5 chronic kidney disease, or end stage renal disease: Secondary | ICD-10-CM | POA: Diagnosis not present

## 2018-05-16 DIAGNOSIS — Z22322 Carrier or suspected carrier of Methicillin resistant Staphylococcus aureus: Secondary | ICD-10-CM | POA: Diagnosis not present

## 2018-05-16 DIAGNOSIS — D631 Anemia in chronic kidney disease: Secondary | ICD-10-CM | POA: Insufficient documentation

## 2018-05-16 DIAGNOSIS — E875 Hyperkalemia: Secondary | ICD-10-CM | POA: Diagnosis not present

## 2018-05-16 DIAGNOSIS — N186 End stage renal disease: Secondary | ICD-10-CM | POA: Diagnosis not present

## 2018-05-16 DIAGNOSIS — Z992 Dependence on renal dialysis: Secondary | ICD-10-CM | POA: Diagnosis not present

## 2018-05-16 DIAGNOSIS — I509 Heart failure, unspecified: Secondary | ICD-10-CM | POA: Insufficient documentation

## 2018-05-16 DIAGNOSIS — Q8781 Alport syndrome: Secondary | ICD-10-CM | POA: Diagnosis not present

## 2018-05-16 LAB — CBC
HCT: 23.2 % — ABNORMAL LOW (ref 39.0–52.0)
Hemoglobin: 6.5 g/dL — ABNORMAL LOW (ref 13.0–17.0)
MCH: 25.4 pg — ABNORMAL LOW (ref 26.0–34.0)
MCHC: 28 g/dL — AB (ref 30.0–36.0)
MCV: 90.6 fL (ref 80.0–100.0)
Platelets: 376 10*3/uL (ref 150–400)
RBC: 2.56 MIL/uL — ABNORMAL LOW (ref 4.22–5.81)
RDW: 19.5 % — ABNORMAL HIGH (ref 11.5–15.5)
WBC: 6.2 10*3/uL (ref 4.0–10.5)
nRBC: 0 % (ref 0.0–0.2)

## 2018-05-16 LAB — RENAL FUNCTION PANEL
Albumin: 2.5 g/dL — ABNORMAL LOW (ref 3.5–5.0)
Anion gap: 14 (ref 5–15)
BUN: 36 mg/dL — AB (ref 6–20)
CO2: 33 mmol/L — AB (ref 22–32)
Calcium: 8.1 mg/dL — ABNORMAL LOW (ref 8.9–10.3)
Chloride: 91 mmol/L — ABNORMAL LOW (ref 98–111)
Creatinine, Ser: 6.57 mg/dL — ABNORMAL HIGH (ref 0.61–1.24)
GFR calc Af Amer: 10 mL/min — ABNORMAL LOW (ref >60)
GFR calc non Af Amer: 8 mL/min — ABNORMAL LOW (ref >60)
GLUCOSE: 109 mg/dL — AB (ref 70–99)
Phosphorus: 3.8 mg/dL (ref 2.5–4.6)
Potassium: 4.8 mmol/L (ref 3.5–5.1)
Sodium: 138 mmol/L (ref 135–145)

## 2018-05-16 LAB — PHOSPHORUS: Phosphorus: 3.8 mg/dL (ref 2.5–4.6)

## 2018-05-16 MED ORDER — EPOETIN ALFA 10000 UNIT/ML IJ SOLN
10000.0000 [IU] | Freq: Once | INTRAMUSCULAR | Status: AC
  Administered 2018-05-16: 10000 [IU] via INTRAVENOUS

## 2018-05-16 MED ORDER — DIPHENHYDRAMINE HCL 50 MG/ML IJ SOLN
25.0000 mg | Freq: Once | INTRAMUSCULAR | Status: AC
  Administered 2018-05-16: 25 mg via INTRAVENOUS

## 2018-05-16 MED ORDER — CHLORHEXIDINE GLUCONATE CLOTH 2 % EX PADS
6.0000 | MEDICATED_PAD | Freq: Every day | CUTANEOUS | Status: DC
  Filled 2018-05-16: qty 6

## 2018-05-16 MED ORDER — MORPHINE SULFATE (PF) 2 MG/ML IV SOLN
2.0000 mg | Freq: Once | INTRAVENOUS | Status: AC
  Administered 2018-05-16: 2 mg via INTRAVENOUS
  Filled 2018-05-16 (×2): qty 1

## 2018-05-16 NOTE — ED Notes (Signed)
Pt taken to dialysis at this time.

## 2018-05-16 NOTE — Progress Notes (Signed)
Post HD Tx @ 0034 See note regarding AMA / pt refusal to sign / pt walked out of HD unit and refused to wait for ED tech to transport after thorough education. MD Kolluru and ED RN aware.   Total 3L removed during tx.    05/16/18 1820  Neurological  Level of Consciousness Alert  Orientation Level Oriented X4  Respiratory  Respiratory Pattern Regular  Chest Assessment Chest expansion symmetrical  Bilateral Breath Sounds Diminished  Cardiac  Pulse Regular  ECG Monitor Yes  Cardiac Rhythm NSR  Ectopy Unifocal PVC's  Ectopy Frequency Frequent  Vascular  R Radial Pulse +2  L Radial Pulse +2  Edema Generalized  Integumentary  Integumentary (WDL) X  Skin Color Appropriate for ethnicity  GU Assessment  Genitourinary (WDL) X  Genitourinary Symptoms  (HD)  Psychosocial  Psychosocial (WDL) WDL  Emotional support given Given to patient

## 2018-05-16 NOTE — ED Notes (Signed)
Pt unable to go up to dialysis at this time due to recent death in dialysis. Will call dialysis back to check soon. Pt resting in NAD at this time.

## 2018-05-16 NOTE — ED Triage Notes (Signed)
Patient here for scheduled dialysis.  States last treatment was "a couple of days ago".  Access is in patient chest.

## 2018-05-16 NOTE — Progress Notes (Signed)
Patient completed hemodialysis at 1815 with stable vital signs. RN called report to ED RN directly following tx, requesting transport from the ED as well. Pt stated that his friend was here to pick him up and that his friend would leave him if he did not get out there now. This RN informed him of the risks associated with leaving AMA, and that if he walks out of the dialysis unit, he will be incurring his own risk d/t another patient on the HD machine at this time as well (requiring RN to stay with the patient currently on tx). Pt voiced his understanding of the risks associated with leaving the dialysis unit and hospital AMA, yet decided to walk out. RN notified ED nurse immediately. Pt refused to sign AMA paperwork. Nephrologist (Kolluru) also notified.

## 2018-05-16 NOTE — Progress Notes (Signed)
[facility]  ROUNDING NOTE   Subjective:   Last hemodialysis was on 2/29. Patient has not been to his outpatient center for hemodialysis since 1/24.   Placed on hemodialysis treatment. Tolerating treatment well. Seated in chair.     HEMODIALYSIS FLOWSHEET:  Blood Flow Rate (mL/min): 400 mL/min Arterial Pressure (mmHg): -200 mmHg Venous Pressure (mmHg): 210 mmHg Transmembrane Pressure (mmHg): 50 mmHg Ultrafiltration Rate (mL/min): 1170 mL/min Dialysate Flow Rate (mL/min): 600 ml/min Conductivity: Machine : 14 Conductivity: Machine : 14 Dialysis Fluid Bolus: Normal Saline Bolus Amount (mL): 250 mL    Objective:  Vital signs in last 24 hours:  Temp:  [98.1 F (36.7 C)-98.2 F (36.8 C)] 98.1 F (36.7 C) (03/04 1440) Pulse Rate:  [91-96] 96 (03/04 1715) Resp:  [12-23] 16 (03/04 1715) BP: (105-134)/(69-96) 126/92 (03/04 1715) SpO2:  [96 %-100 %] 100 % (03/04 1715) Weight:  [75.7 kg-75.8 kg] 75.7 kg (03/04 1440)  Weight change:  Filed Weights   05/16/18 1042 05/16/18 1440  Weight: 75.8 kg 75.7 kg    Intake/Output: No intake/output data recorded.   Intake/Output this shift:  No intake/output data recorded.  Physical Exam: General: NAD, sitting in chair  Head: Normocephalic, atraumatic. Moist oral mucosal membranes  Eyes: Anicteric, PERRL  Neck: Supple, trachea midline  Lungs:  Clear to auscultation  Heart: Regular rate and rhythm  Abdomen:  Soft, nontender,   Extremities: 1+ peripheral edema, right second toe erythema  Neurologic: Nonfocal, moving all four extremities  Skin: No lesions  Access: RIJ permcath    Basic Metabolic Panel: Recent Labs  Lab 05/12/18 1124 05/16/18 1527  NA 138 138  K 5.7* 4.8  CL 94* 91*  CO2 34* 33*  GLUCOSE 101* 109*  BUN 43* 36*  CREATININE 6.47* 6.57*  CALCIUM 8.3* 8.1*  PHOS 3.4 3.8  3.8    Liver Function Tests: Recent Labs  Lab 05/12/18 1124 05/16/18 1527  ALBUMIN 2.3* 2.5*   No results for  input(s): LIPASE, AMYLASE in the last 168 hours. No results for input(s): AMMONIA in the last 168 hours.  CBC: Recent Labs  Lab 05/12/18 1124 05/16/18 1527  WBC 9.0 6.2  NEUTROABS 7.5  --   HGB 6.7* 6.5*  HCT 23.9* 23.2*  MCV 91.2 90.6  PLT 403* 376    Cardiac Enzymes: No results for input(s): CKTOTAL, CKMB, CKMBINDEX, TROPONINI in the last 168 hours.  BNP: Invalid input(s): POCBNP  CBG: No results for input(s): GLUCAP in the last 168 hours.  Microbiology: Results for orders placed or performed during the hospital encounter of 04/26/18  MRSA PCR Screening     Status: None   Collection Time: 04/26/18  1:09 PM  Result Value Ref Range Status   MRSA by PCR NEGATIVE NEGATIVE Final    Comment:        The GeneXpert MRSA Assay (FDA approved for NASAL specimens only), is one component of a comprehensive MRSA colonization surveillance program. It is not intended to diagnose MRSA infection nor to guide or monitor treatment for MRSA infections. Performed at [facility], [address] [place], [place] [address]     Coagulation Studies: No results for input(s): LABPROT, INR in the last 72 hours.  Urinalysis: No results for input(s): COLORURINE, LABSPEC, PHURINE, GLUCOSEU, HGBUR, BILIRUBINUR, KETONESUR, PROTEINUR, UROBILINOGEN, NITRITE, LEUKOCYTESUR in the last 72 hours.  Invalid input(s): APPERANCEUR    Imaging: No results found.   Medications:    . [START ON 05/17/2018] Chlorhexidine Gluconate Cloth  6 each  Topical Q0600     Assessment/ Plan:  [name] is a 58 y.o. black male withend stage renal disease on hemodialysis secondary to Alport's syndrome, end stage liver disease with ascites, hypertension, peripheral vascular disease, coronary artery disease, hyperlipidemia, gastrointestinal AVMs, pulmonary hypertension  [facility] ([facility]) RIJ permcath.210 min, currently no transportation to dialysis unit  1. Hyperkalemia - 2 K  bath on hemodialysis  2. End Stage Renal Disease: on hemodialysis. Unable to get to his outpatient clinic. Unfortunately no other local clinic is willing to accept patient at this time. Patient is now getting his dialysis at [facility].  Seen and examined on hemodialysis treatment. Tolerating treatment well.  3. Anemia of chronic kidney disease: with multiple GI bleeds.  - EPO with HD treatment.   4. Secondary Hyperparathyroidism: PTH 597 on 03/26/18.  Phosphorus 3.4 on 2/22. Calcium 8.3    LOS: 0 Stanley [name] 3/4/20205:47 PM

## 2018-05-16 NOTE — Progress Notes (Signed)
HD Tx End    05/16/18 1815  Hand-Off documentation  Report given to (Full Name) Claretta Fraise RN  Report received from (Full Name) Trellis Paganini RN  Vital Signs  Temp 98.4 F (36.9 C)  Temp Source Oral  Pulse Rate 96  Pulse Rate Source Monitor  Resp 19  BP (!) 137/95  BP Location Right Arm  BP Method Automatic  Patient Position (if appropriate) Sitting  Oxygen Therapy  SpO2 100 %  O2 Device Room Air  Pain Assessment  Pain Scale 0-10  Pain Score 2  Pain Type Neuropathic pain  Dialysis Weight  Weight 72.7 kg  Type of Weight Post-Dialysis  During Hemodialysis Assessment  Blood Flow Rate (mL/min) 200 mL/min  Arterial Pressure (mmHg) -200 mmHg  Venous Pressure (mmHg) 210 mmHg  Transmembrane Pressure (mmHg) 50 mmHg  Ultrafiltration Rate (mL/min) 170 mL/min  Dialysate Flow Rate (mL/min) 600 ml/min  Conductivity: Machine  14  HD Safety Checks Performed Yes  KECN 61.7 KECN  Dialysis Fluid Bolus Normal Saline  Bolus Amount (mL) 250 mL  Intra-Hemodialysis Comments Tx completed;Tolerated well  Hemodialysis Catheter Right Internal jugular Double-lumen  No Placement Date or Time found.   Placed prior to admission: Yes  Orientation: Right  Access Location: Internal jugular  Hemodialysis Catheter Type: Double-lumen  Site Condition No complications  Blue Lumen Status Capped (Central line);Heparin locked;Flushed;Saline locked  Red Lumen Status Flushed;Saline locked;Capped (Central line);Heparin locked  Catheter fill solution Heparin 1000 units/ml  Catheter fill volume (Arterial) 1.5 cc  Catheter fill volume (Venous) 1.5  Dressing Type Biopatch  Dressing Status Dressing changed;Clean;Dry;Intact  Drainage Description None  Post treatment catheter status Capped and Clamped

## 2018-05-16 NOTE — Progress Notes (Signed)
HD Tx Start   05/16/18 1445  Vital Signs  Pulse Rate 94  Resp (!) 21  BP 126/82  Oxygen Therapy  SpO2 98 %  During Hemodialysis Assessment  Blood Flow Rate (mL/min) 400 mL/min  Arterial Pressure (mmHg) -180 mmHg  Venous Pressure (mmHg) 160 mmHg  Transmembrane Pressure (mmHg) 40 mmHg  Ultrafiltration Rate (mL/min) 1170 mL/min  Dialysate Flow Rate (mL/min) 600 ml/min  Conductivity: Machine  14  HD Safety Checks Performed Yes  Dialysis Fluid Bolus Normal Saline  Bolus Amount (mL) 250 mL  Intra-Hemodialysis Comments Tx initiated (MD ordered pain meds for pt d/t pre assessment pain scale)

## 2018-05-16 NOTE — Progress Notes (Signed)
Pre HD Assessment   05/16/18 1440  Neurological  Level of Consciousness Alert  Orientation Level Oriented X4  Respiratory  Respiratory Pattern Regular  Chest Assessment Chest expansion symmetrical  Bilateral Breath Sounds Diminished  Cough Non-productive  Cardiac  Pulse Regular  ECG Monitor Yes  Cardiac Rhythm NSR  Ectopy Unifocal PVC's  Ectopy Frequency Frequent  Vascular  R Radial Pulse +2  L Radial Pulse +2  Edema Generalized  Integumentary  Integumentary (WDL) X  Skin Color Appropriate for ethnicity  Additional Integumentary Comments HD pt  GU Assessment  Genitourinary (WDL) X  Genitourinary Symptoms  (HD)  Psychosocial  Psychosocial (WDL) WDL  Emotional support given Given to patient

## 2018-05-16 NOTE — Progress Notes (Signed)
Post HD Tx  3.0kg fluid removed, stable. See note regarding pt AMA.    05/16/18 1825  Vital Signs  Pulse Rate 95  Resp 16  BP 128/83  Oxygen Therapy  SpO2 100 %  Post-Hemodialysis Assessment  Rinseback Volume (mL) 250 mL  KECN 61.7 V  Dialyzer Clearance Lightly streaked  Duration of HD Treatment -hour(s) 3.5 hour(s)  Hemodialysis Intake (mL) 500 mL  UF Total -Machine (mL) 3500 mL  Net UF (mL) 3000 mL  Tolerated HD Treatment Yes  Post-Hemodialysis Comments pt walkedout of tx room AMA, without signing Antonito (MD and ED notified )

## 2018-05-16 NOTE — Progress Notes (Signed)
HD Pre Tx    05/16/18 1440  Vital Signs  Temp 98.1 F (36.7 C)  Temp Source Oral  Pulse Rate 96  Resp 12  BP 121/83  BP Location Left Arm  BP Method Automatic  Patient Position (if appropriate) Sitting  Oxygen Therapy  SpO2 99 %  O2 Device Room Air  Pain Assessment  Pain Scale 0-10  Pain Score 8  Pain Type Neuropathic pain  Pain Location Foot  Pain Orientation Left  Pain Intervention(s) MD notified (Comment) (MD notified will order pain medication. )  Dialysis Weight  Weight 75.7 kg  Type of Weight Pre-Dialysis  Time-Out for Hemodialysis  What Procedure? HD  Pt Identifiers(min of two) First/Last Name;MRN/Account#  Correct Site? Yes  Correct Side? Yes  Correct Procedure? Yes  Consents Verified? Yes  Rad Studies Available? No  Safety Precautions Reviewed? Yes  Engineer, civil (consulting) Number 5  Station Number 3  UF/Alarm Test Passed  Conductivity: Meter 14  Conductivity: Machine  14  pH 7.2  Reverse Osmosis main  Normal Saline Lot Number [id number]  Dialyzer Lot Number [id number]  Disposable Set Lot Number [id number]  Machine Temperature 98.6 F (37 C)  Musician and Audible Yes  Blood Lines Intact and Secured Yes  Pre Treatment Patient Checks  Vascular access used during treatment Catheter  Patient is receiving dialysis in a chair Yes  Hepatitis B Surface Antigen Results Negative  Date Hepatitis B Surface Antigen Drawn 06/16/17  Hepatitis B Surface Antibody  (Immune >10)  Date Hepatitis B Surface Antibody Drawn 06/16/17  Hemodialysis Consent Verified Yes  Hemodialysis Standing Orders Initiated Yes  ECG (Telemetry) Monitor On Yes  Prime Ordered Normal Saline  Length of  DialysisTreatment -hour(s) 3.5 Hour(s)  Dialyzer Elisio 17H NR  Dialysate 2K, 2.5 Ca  Dialysate Flow Ordered 600  Blood Flow Rate Ordered 400 mL/min  Dialysis Blood Pressure Support Ordered Normal Saline  Education / Care Plan  Dialysis Education Provided Yes  Documented Education in  Care Plan Yes  Fistula / Graft Right Upper arm Arteriovenous fistula  No Placement Date or Time found.   Orientation: Right  Access Location: Upper arm  Access Type: Arteriovenous fistula  Site Condition No complications  Fistula / Graft Assessment Absent   Drainage Description None  Hemodialysis Catheter Right Internal jugular Double-lumen  No Placement Date or Time found.   Placed prior to admission: Yes  Orientation: Right  Access Location: Internal jugular  Hemodialysis Catheter Type: Double-lumen  Site Condition No complications  Blue Lumen Status Capped (Central line)  Red Lumen Status Capped (Central line)  Dressing Type Biopatch  Dressing Status Dressing changed;Clean;Dry;Intact  Drainage Description None

## 2018-05-16 NOTE — ED Provider Notes (Signed)
[facility] Emergency Department Provider Note  ____________________________________________  Time seen: Approximately 3:08 PM  I have reviewed the triage vital signs and the nursing notes.   HISTORY  Chief Complaint Vascular Access Problem    HPI Stanley [name] is a 58 y.o. male with a history of alcohol abuse, CHF, cirrhosis, CAD, end-stage renal disease on hemodialysis who comes the ED requesting dialysis today.  Last treatment was a few days ago.  He reports to me that he is unable to go to his usual dialysis place because his  Medicaid [employer] discontinued his medical transport benefit.  Denies chest pain shortness of breath dizziness palpitations or syncope.  Denies abdominal pain or vomiting, no fevers or chills.  Feels like he is in his usual state of health without acute symptoms.     Past Medical History:  Diagnosis Date  . Alcohol abuse   . CHF (congestive heart failure) ([place])   . Cirrhosis ([place])   . Coronary artery disease 2009  . Drug abuse ([place])   . End stage renal disease on dialysis [facility]) NEPHROLOGIST-   DR [facility]  IN [place]   HEMODIALYSIS --   TUES/  THURS/  SAT  . Gastrointestinal bleed 06/13/2017   From chart...hx of multiple GI bleeds  . GERD (gastroesophageal reflux disease)   . Hyperlipidemia   . Hypertension   . PAD (peripheral artery disease) ([place])   . Renal insufficiency    Per pt, 32 oz fluid restriction per day  . S/P triple vessel bypass 06/09/2016   2009ish  . Suicidal ideation    & HOMICIDAL IDEATION --  06-16-2013   ADMITTED TO BEHAVIOR HEALTH     Patient Active Problem List   Diagnosis Date Noted  . PAD (peripheral artery disease) ([place]) 04/14/2018  . Healthcare-associated pneumonia 01/15/2018  . Acute on chronic respiratory failure ([place]) 01/15/2018  . Acute pulmonary edema ([place]) 12/23/2017  . Acute respiratory failure ([place]) 10/29/2017  . ESRD (end stage renal disease) on dialysis ([place]) 07/28/2017  .  Protein-calorie malnutrition, severe 06/14/2017  . Encounter for dialysis [facility])   . Palliative care by specialist   . Goals of care, counseling/discussion   . Malnutrition of moderate degree 06/05/2017  . Secondary esophageal varices without bleeding ([place])   . Stomach irritation   . Idiopathic esophageal varices without bleeding ([place])   . Alcoholic hepatitis with ascites 05/24/2017  . ESRD (end stage renal disease) ([place]) 04/28/2017  . Uremia 03/08/2017  . ESRD on hemodialysis ([place]) 03/03/2017  . Weakness 02/28/2017  . Hypocalcemia 02/22/2017  . Shortness of breath 11/26/2016  . COPD (chronic obstructive pulmonary disease) ([place]) 10/30/2016  . COPD exacerbation ([place]) 10/29/2016  . Anemia   . Heme positive stool   . Ulceration of intestine   . Benign neoplasm of transverse colon   . Acute gastrointestinal hemorrhage   . Esophageal candidiasis ([place])   . Angiodysplasia of intestinal tract   . Acute respiratory failure with hypoxia ([place]) 07/03/2016  . GI bleeding 06/24/2016  . Rectal bleeding 06/14/2016  . Anemia of chronic disease 06/01/2016  . MRSA carrier 06/01/2016  . Chronic renal failure 05/23/2016  . Ischemic heart disease 05/23/2016  . Angiodysplasia of small intestine   . Melena   . Small bowel bleed not requiring more than 4 units of blood in 24 hours, ICU, or surgery   . Anemia due to chronic blood loss   . Abdominal pain 05/05/2016  . Acute posthemorrhagic anemia 04/17/2016  .  Gastrointestinal bleed 04/17/2016  . History of esophagogastroduodenoscopy (EGD) 04/17/2016  . Elevated troponin 04/17/2016  . Alcohol abuse 04/17/2016  . Upper GI bleed 01/19/2016  . Blood in stool   . Angiodysplasia of stomach and duodenum with hemorrhage   . Gastritis   . Reflux esophagitis   . GI bleed 05/16/2015  . Acute GI bleeding   . Symptomatic anemia 04/30/2015  . HTN (hypertension) 04/06/2015  . GERD (gastroesophageal reflux disease) 04/06/2015  . HLD (hyperlipidemia)  04/06/2015  . Dyspnea 04/06/2015  . Cirrhosis of liver with ascites ([place]) 04/06/2015  . Ascites 04/06/2015  . GIB (gastrointestinal bleeding) 03/23/2015  . Homicidal ideation 06/19/2013  . Suicidal intent 06/19/2013  . Homicidal ideations 06/19/2013  . Hyperkalemia 06/16/2013  . Mandible fracture ([place]) 06/05/2013  . Fracture, mandible ([place]) 06/02/2013  . Coronary atherosclerosis of native coronary artery 06/02/2013  . ESRD on dialysis ([place]) 06/02/2013  . Mandible open fracture ([place]) 06/02/2013     Past Surgical History:  Procedure Laterality Date  . A/V FISTULAGRAM Right 06/06/2017   Procedure: A/V FISTULAGRAM;  Surgeon: Katha Cabal, MD;  Location: [place] [facility];  Service: Cardiovascular;  Laterality: Right;  . A/V SHUNT INTERVENTION N/A 06/06/2017   Procedure: A/V SHUNT INTERVENTION;  Surgeon: Katha Cabal, MD;  Location: [place] [facility];  Service: Cardiovascular;  Laterality: N/A;  . AGILE CAPSULE N/A 06/19/2016   Procedure: AGILE CAPSULE;  Surgeon: [name], MD;  Location: [facility] ENDOSCOPY;  Service: Endoscopy;  Laterality: N/A;  . COLONOSCOPY WITH PROPOFOL N/A 06/18/2016   Procedure: COLONOSCOPY WITH PROPOFOL;  Surgeon: [name], MD;  Location: [facility] ENDOSCOPY;  Service: Endoscopy;  Laterality: N/A;  . COLONOSCOPY WITH PROPOFOL N/A 08/12/2016   Procedure: COLONOSCOPY WITH PROPOFOL;  Surgeon: [name], MD;  Location[facility] ENDOSCOPY;  Service: Endoscopy;  Laterality: N/A;  . COLONOSCOPY WITH PROPOFOL N/A 05/05/2017   Procedure: COLONOSCOPY WITH PROPOFOL;  Surgeon: Manya Silvas, MD;  Location: [facility] ENDOSCOPY;  Service: Endoscopy;  Laterality: N/A;  . CORONARY ANGIOPLASTY  ?   PT UNABLE TO TELL IF  BEFORE OR AFTER  CABG  . CORONARY ARTERY BYPASS GRAFT  2008  ([place])   3 VESSEL  . DIALYSIS FISTULA CREATION  LAST SURGERY  APPOX  2008  . ENTEROSCOPY N/A 05/10/2016   Procedure: ENTEROSCOPY;  Surgeon: Jerene Bears, MD;  Location: [place];   Service: Gastroenterology;  Laterality: N/A;  . ENTEROSCOPY N/A 08/12/2016   Procedure: ENTEROSCOPY;  Surgeon: [name], MD;  Location: [facility] ENDOSCOPY;  Service: Endoscopy;  Laterality: N/A;  . ENTEROSCOPY Left 06/03/2017   Procedure: ENTEROSCOPY;  Surgeon: [place] Manifold, MD;  Location: [facility] ENDOSCOPY;  Service: Endoscopy;  Laterality: Left;  Procedure date will ultimately depend on when patient is medically optimized before the procedure, pending hemodialysis and blood transfusions etc. Will place on schedule and change depending on clinical status.   . ENTEROSCOPY N/A 06/05/2017   Procedure: ENTEROSCOPY;  Surgeon: [place]gel Manifold, MD;  Location: [facility] ENDOSCOPY;  Service: Endoscopy;  Laterality: N/A;  . ENTEROSCOPY N/A 06/15/2017   Procedure: Push ENTEROSCOPY;  Surgeon: [name], MD;  Location: [facility] ENDOSCOPY;  Service: Endoscopy;  Laterality: N/A;  . ESOPHAGOGASTRODUODENOSCOPY N/A 05/07/2015   Procedure: ESOPHAGOGASTRODUODENOSCOPY (EGD);  Surgeon: Hulen Luster, MD;  Location: [facility] ENDOSCOPY;  Service: Endoscopy;  Laterality: N/A;  . ESOPHAGOGASTRODUODENOSCOPY (EGD) WITH PROPOFOL N/A 05/17/2015   Procedure: ESOPHAGOGASTRODUODENOSCOPY (EGD) WITH PROPOFOL;  Surgeon: [name], MD;  Location: [facility] ENDOSCOPY;  Service:  Endoscopy;  Laterality: N/A;  . ESOPHAGOGASTRODUODENOSCOPY (EGD) WITH PROPOFOL N/A 01/20/2016   Procedure: ESOPHAGOGASTRODUODENOSCOPY (EGD) WITH PROPOFOL;  Surgeon: [name], MD;  Location: [facility] ENDOSCOPY;  Service: Endoscopy;  Laterality: N/A;  . ESOPHAGOGASTRODUODENOSCOPY (EGD) WITH PROPOFOL N/A 04/17/2016   Procedure: ESOPHAGOGASTRODUODENOSCOPY (EGD) WITH PROPOFOL;  Surgeon: Lin Landsman, MD;  Location: [facility] ENDOSCOPY;  Service: Gastroenterology;  Laterality: N/A;  . ESOPHAGOGASTRODUODENOSCOPY (EGD) WITH PROPOFOL  05/09/2016   Procedure: ESOPHAGOGASTRODUODENOSCOPY (EGD) WITH PROPOFOL;  Surgeon: Jerene Bears, MD;  Location: [place];  Service: Endoscopy;;  .  ESOPHAGOGASTRODUODENOSCOPY (EGD) WITH PROPOFOL N/A 06/16/2016   Procedure: ESOPHAGOGASTRODUODENOSCOPY (EGD) WITH PROPOFOL;  Surgeon: [name], MD;  Location: [facility] ENDOSCOPY;  Service: Endoscopy;  Laterality: N/A;  . ESOPHAGOGASTRODUODENOSCOPY (EGD) WITH PROPOFOL N/A 05/05/2017   Procedure: ESOPHAGOGASTRODUODENOSCOPY (EGD) WITH PROPOFOL;  Surgeon: Manya Silvas, MD;  Location: [facility] ENDOSCOPY;  Service: Endoscopy;  Laterality: N/A;  . ESOPHAGOGASTRODUODENOSCOPY (EGD) WITH PROPOFOL N/A 06/15/2017   Procedure: ESOPHAGOGASTRODUODENOSCOPY (EGD) WITH PROPOFOL;  Surgeon: [name], MD;  Location: [facility] ENDOSCOPY;  Service: Endoscopy;  Laterality: N/A;  . ESOPHAGOGASTRODUODENOSCOPY (EGD) WITH PROPOFOL N/A 04/27/2018   Procedure: ESOPHAGOGASTRODUODENOSCOPY (EGD) WITH PROPOFOL;  Surgeon: [name], MD;  Location: [facility] ENDOSCOPY;  Service: Endoscopy;  Laterality: N/A;  . GIVENS CAPSULE STUDY N/A 05/07/2016   Procedure: GIVENS CAPSULE STUDY;  Surgeon: Doran Stabler, MD;  Location: [place];  Service: Endoscopy;  Laterality: N/A;  . LOWER EXTREMITY ANGIOGRAPHY Left 04/19/2018   Procedure: LOWER EXTREMITY ANGIOGRAPHY;  Surgeon: Algernon Huxley, MD;  Location: [place] [facility];  Service: Cardiovascular;  Laterality: Left;  Marland Kitchen MANDIBULAR HARDWARE REMOVAL N/A 07/29/2013   Procedure: REMOVAL OF ARCH BARS;  Surgeon: Theodoro Kos, DO;  Location: [place];  Service: Plastics;  Laterality: N/A;  . ORIF MANDIBULAR FRACTURE N/A 06/05/2013   Procedure: REPAIR OF MANDIBULAR FRACTURE x 2 with maxillo-mandibular fixation ;  Surgeon: Theodoro Kos, DO;  Location: [place];  Service: Plastics;  Laterality: N/A;  . PARACENTESIS    . PERIPHERAL ARTERIAL STENT GRAFT Left      Prior to Admission medications   Medication Sig Start Date End Date Taking? Authorizing Provider  acetaminophen (TYLENOL) 325 MG tablet Take 2 tablets (650 mg total) by mouth every 6 (six) hours as needed for mild pain (or Fever  >/= 101). 05/07/18   Gouru, Aruna, MD  alum & mag hydroxide-simeth (MAALOX/MYLANTA) 200-200-20 MG/5ML suspension Take 30 mLs by mouth every 4 (four) hours as needed for indigestion. 05/07/18   Nicholes Mango, MD  aspirin EC 81 MG tablet Take 1 tablet (81 mg total) by mouth daily. 04/20/18 04/20/19  Epifanio Lesches, MD  budesonide-formoterol (SYMBICORT) 160-4.5 MCG/ACT inhaler Inhale 2 puffs into the lungs daily. 05/07/18   Nicholes Mango, MD  calcium acetate (PHOSLO) 667 MG capsule Take 2,001 mg by mouth 3 (three) times daily.    [provider]  docusate sodium (COLACE) 100 MG capsule Take 1 capsule (100 mg total) by mouth 2 (two) times daily as needed for mild constipation. 05/07/18   Nicholes Mango, MD  folic acid (FOLVITE) 1 MG tablet Take 1 tablet (1 mg total) by mouth daily. 10/31/17   Fritzi Mandes, MD  gabapentin (NEURONTIN) 300 MG capsule Take 300 mg by mouth 3 (three) times daily.  08/16/16   [provider]  ipratropium-albuterol (DUONEB) 0.5-2.5 (3) MG/3ML SOLN Take 3 mLs by nebulization every 6 (six) hours as needed. 05/07/18   Nicholes Mango, MD  labetalol (NORMODYNE) 100  MG tablet Take 100 mg by mouth daily.    [provider]  multivitamin (RENA-VIT) TABS tablet Take 1 tablet by mouth at bedtime. 10/31/17   Fritzi Mandes, MD  Nutritional Supplements (FEEDING SUPPLEMENT, NEPRO CARB STEADY,) LIQD Take 237 mLs by mouth 2 (two) times daily between meals. 05/07/18   Nicholes Mango, MD  omeprazole (PRILOSEC) 40 MG capsule Take 1 capsule (40 mg total) by mouth 2 (two) times daily. 05/07/18   Gouru, Illene Silver, MD  oxyCODONE (OXY IR/ROXICODONE) 5 MG immediate release tablet Take 1 tablet (5 mg total) by mouth every 6 (six) hours as needed for moderate pain. 05/07/18   Gouru, Illene Silver, MD  sucralfate (CARAFATE) 1 GM/10ML suspension Take 10 mLs (1 g total) by mouth 4 (four) times daily -  with meals and at bedtime. 05/07/18   Gouru, Illene Silver, MD  tiotropium (SPIRIVA HANDIHALER) 18 MCG inhalation capsule  Place 1 capsule (18 mcg total) into inhaler and inhale daily. 05/07/18 08/05/18  Nicholes Mango, MD  vitamin C (VITAMIN C) 250 MG tablet Take 1 tablet (250 mg total) by mouth 2 (two) times daily. 04/20/18   Epifanio Lesches, MD     Allergies Patient has no known allergies.   Family History  Problem Relation Age of Onset  . Colon cancer Mother   . Cancer Father   . Cancer Sister   . Kidney disease Brother     Social History Social History   Tobacco Use  . Smoking status: Current Every Day Smoker    Packs/day: 0.15    Years: 40.00    Pack years: 6.00    Types: Cigarettes  . Smokeless tobacco: Never Used  Substance Use Topics  . Alcohol use: No    Comment: pt reports quitting after learning about cirrhosis  . Drug use: No    Frequency: 7.0 times per week    Types: Marijuana, Cocaine    Review of Systems  Constitutional:   No fever or chills.  ENT:   No sore throat. No rhinorrhea. Cardiovascular:   No chest pain or syncope. Respiratory:   No dyspnea or cough. Gastrointestinal:   Negative for abdominal pain, vomiting and diarrhea.  Musculoskeletal:   Negative for focal pain or swelling All other systems reviewed and are negative except as documented above in ROS and HPI.  ____________________________________________   PHYSICAL EXAM:  VITAL SIGNS: ED Triage Vitals  Enc Vitals Group     BP 05/16/18 1041 123/69     Pulse Rate 05/16/18 1041 96     Resp 05/16/18 1041 20     Temp 05/16/18 1041 98.2 F (36.8 C)     Temp Source 05/16/18 1041 Oral     SpO2 05/16/18 1041 96 %     Weight 05/16/18 1042 167 lb (75.8 kg)     Height 05/16/18 1042 6\' 3"  (1.905 m)     Head Circumference --      Peak Flow --      Pain Score 05/16/18 1042 0     Pain Loc --      Pain Edu? --      Excl. in [place]? --     Vital signs reviewed, nursing assessments reviewed.   Constitutional:   Alert and oriented. Non-toxic appearance. Eyes:   Conjunctivae are normal. EOMI. PERRL. ENT       Head:   Normocephalic and atraumatic.      Nose:   No congestion/rhinnorhea.       Mouth/Throat:   MMM, no  pharyngeal erythema. No peritonsillar mass.       Neck:   No meningismus. Full ROM. Hematological/Lymphatic/Immunilogical:   No cervical lymphadenopathy. Cardiovascular:   RRR. Symmetric bilateral radial and DP pulses.  No murmurs. Cap refill less than 2 seconds. Respiratory:   Normal respiratory effort without tachypnea/retractions. Breath sounds are clear and equal bilaterally. No wheezes/rales/rhonchi. Gastrointestinal:   Soft and nontender.  Mildly distended. There is no CVA tenderness.  No rebound, rigidity, or guarding.  No significant ascites Musculoskeletal:   Normal range of motion in all extremities. No joint effusions.  No lower extremity tenderness.  No edema. Neurologic:   Normal speech and language.  Motor grossly intact. No acute focal neurologic deficits are appreciated.  ____________________________________________    LABS (pertinent positives/negatives) (all labs ordered are listed, but only abnormal results are displayed) Labs Reviewed  PHOSPHORUS  RENAL FUNCTION PANEL  CBC   ____________________________________________   EKG    ____________________________________________    RADIOLOGY  No results found.  ____________________________________________   PROCEDURES Procedures  ____________________________________________    CLINICAL IMPRESSION / ASSESSMENT AND PLAN / ED COURSE  Medications ordered in the ED: Medications  Chlorhexidine Gluconate Cloth 2 % PADS 6 each (has no administration in time range)  epoetin alfa (EPOGEN,PROCRIT) injection 10,000 Units (has no administration in time range)  diphenhydrAMINE (BENADRYL) injection 25 mg (has no administration in time range)    Pertinent labs & imaging results that were available during my care of the patient were reviewed by me and considered in my medical decision making (see chart for  details).    Patient presents in baseline state of health without acute symptoms.  Vital signs are normal.  Patient arranged for dialysis at the hospital today, as has been done in the past.  He is medically stable.  Can follow-up outpatient.      ____________________________________________   FINAL CLINICAL IMPRESSION(S) / ED DIAGNOSES    Final diagnoses:  [name] [facility])     ED Discharge Orders    None      Portions of this note were generated with dragon dictation software. Dictation errors may occur despite best attempts at proofreading.   Carrie Mew, MD 05/16/18 [phone]

## 2018-05-19 ENCOUNTER — Encounter: Payer: Self-pay | Admitting: Emergency Medicine

## 2018-05-19 ENCOUNTER — Other Ambulatory Visit: Payer: Self-pay

## 2018-05-19 ENCOUNTER — Non-Acute Institutional Stay
Admission: EM | Admit: 2018-05-19 | Discharge: 2018-05-19 | Disposition: A | Discharge status: Home / Self Care | Payer: Medicare Other | Attending: Emergency Medicine | Admitting: Emergency Medicine

## 2018-05-19 DIAGNOSIS — K7460 Unspecified cirrhosis of liver: Secondary | ICD-10-CM | POA: Insufficient documentation

## 2018-05-19 DIAGNOSIS — I509 Heart failure, unspecified: Secondary | ICD-10-CM | POA: Diagnosis not present

## 2018-05-19 DIAGNOSIS — I132 Hypertensive heart and chronic kidney disease with heart failure and with stage 5 chronic kidney disease, or end stage renal disease: Secondary | ICD-10-CM | POA: Insufficient documentation

## 2018-05-19 DIAGNOSIS — Z79899 Other long term (current) drug therapy: Secondary | ICD-10-CM | POA: Diagnosis not present

## 2018-05-19 DIAGNOSIS — Z992 Dependence on renal dialysis: Secondary | ICD-10-CM | POA: Diagnosis not present

## 2018-05-19 DIAGNOSIS — Z22322 Carrier or suspected carrier of Methicillin resistant Staphylococcus aureus: Secondary | ICD-10-CM | POA: Diagnosis not present

## 2018-05-19 DIAGNOSIS — Z951 Presence of aortocoronary bypass graft: Secondary | ICD-10-CM | POA: Insufficient documentation

## 2018-05-19 DIAGNOSIS — D638 Anemia in other chronic diseases classified elsewhere: Secondary | ICD-10-CM | POA: Diagnosis not present

## 2018-05-19 DIAGNOSIS — K210 Gastro-esophageal reflux disease with esophagitis: Secondary | ICD-10-CM | POA: Insufficient documentation

## 2018-05-19 DIAGNOSIS — I739 Peripheral vascular disease, unspecified: Secondary | ICD-10-CM | POA: Insufficient documentation

## 2018-05-19 DIAGNOSIS — Z7951 Long term (current) use of inhaled steroids: Secondary | ICD-10-CM | POA: Diagnosis not present

## 2018-05-19 DIAGNOSIS — J449 Chronic obstructive pulmonary disease, unspecified: Secondary | ICD-10-CM | POA: Insufficient documentation

## 2018-05-19 DIAGNOSIS — I2510 Atherosclerotic heart disease of native coronary artery without angina pectoris: Secondary | ICD-10-CM | POA: Insufficient documentation

## 2018-05-19 DIAGNOSIS — I120 Hypertensive chronic kidney disease with stage 5 chronic kidney disease or end stage renal disease: Secondary | ICD-10-CM | POA: Diagnosis not present

## 2018-05-19 DIAGNOSIS — F17210 Nicotine dependence, cigarettes, uncomplicated: Secondary | ICD-10-CM | POA: Diagnosis not present

## 2018-05-19 DIAGNOSIS — K7011 Alcoholic hepatitis with ascites: Secondary | ICD-10-CM | POA: Insufficient documentation

## 2018-05-19 DIAGNOSIS — F1010 Alcohol abuse, uncomplicated: Secondary | ICD-10-CM | POA: Diagnosis not present

## 2018-05-19 DIAGNOSIS — K31811 Angiodysplasia of stomach and duodenum with bleeding: Secondary | ICD-10-CM | POA: Insufficient documentation

## 2018-05-19 DIAGNOSIS — Z7982 Long term (current) use of aspirin: Secondary | ICD-10-CM | POA: Diagnosis not present

## 2018-05-19 DIAGNOSIS — N186 End stage renal disease: Secondary | ICD-10-CM

## 2018-05-19 DIAGNOSIS — E785 Hyperlipidemia, unspecified: Secondary | ICD-10-CM | POA: Insufficient documentation

## 2018-05-19 LAB — CBC
HCT: 23.5 % — ABNORMAL LOW (ref 39.0–52.0)
Hemoglobin: 6.7 g/dL — ABNORMAL LOW (ref 13.0–17.0)
MCH: 25.8 pg — ABNORMAL LOW (ref 26.0–34.0)
MCHC: 28.5 g/dL — ABNORMAL LOW (ref 30.0–36.0)
MCV: 90.4 fL (ref 80.0–100.0)
Platelets: 344 10*3/uL (ref 150–400)
RBC: 2.6 MIL/uL — ABNORMAL LOW (ref 4.22–5.81)
RDW: 19.2 % — ABNORMAL HIGH (ref 11.5–15.5)
WBC: 6.7 10*3/uL (ref 4.0–10.5)
nRBC: 0 % (ref 0.0–0.2)

## 2018-05-19 LAB — RENAL FUNCTION PANEL
ANION GAP: 13 (ref 5–15)
Albumin: 2.3 g/dL — ABNORMAL LOW (ref 3.5–5.0)
BUN: 35 mg/dL — ABNORMAL HIGH (ref 6–20)
CO2: 35 mmol/L — ABNORMAL HIGH (ref 22–32)
Calcium: 8.1 mg/dL — ABNORMAL LOW (ref 8.9–10.3)
Chloride: 91 mmol/L — ABNORMAL LOW (ref 98–111)
Creatinine, Ser: 8.78 mg/dL — ABNORMAL HIGH (ref 0.61–1.24)
GFR calc Af Amer: 7 mL/min — ABNORMAL LOW (ref >60)
GFR calc non Af Amer: 6 mL/min — ABNORMAL LOW (ref >60)
Glucose, Bld: 156 mg/dL — ABNORMAL HIGH (ref 70–99)
POTASSIUM: 5.4 mmol/L — AB (ref 3.5–5.1)
Phosphorus: 6.1 mg/dL — ABNORMAL HIGH (ref 2.5–4.6)
SODIUM: 139 mmol/L (ref 135–145)

## 2018-05-19 MED ORDER — CHLORHEXIDINE GLUCONATE 4 % EX LIQD: 6.0000 "application " | Freq: Every day | CUTANEOUS | Status: DC

## 2018-05-19 MED ORDER — OXYCODONE HCL 5 MG PO TABS
10.0000 mg | ORAL_TABLET | Freq: Once | ORAL | Status: DC
  Filled 2018-05-19: qty 2

## 2018-05-19 NOTE — ED Notes (Signed)
Pt returned from dialysis via wheelchair by [employer]. Pt called for ride. Pt then ambulated down hallway and stated he was leaving. Dr. Joni Fears walked down hallway after pt. Pt did not return to hall bed for proper DC.

## 2018-05-19 NOTE — ED Triage Notes (Signed)
Here for scheduled dialysis

## 2018-05-19 NOTE — ED Provider Notes (Signed)
[facility] Emergency Department Provider Note ____________________________________________   First MD Initiated Contact with Patient 05/19/18 1339     (approximate)  I have reviewed the triage vital signs and the nursing notes.   HISTORY  Chief Complaint scheduled dialysis    HPI Stanley [name] is a 58 y.o. male with PMH as noted below who presents the ED for his routine dialysis.  The patient is without acute complaints at this time.  The patient has been getting dialysis in the hospital through the ED for some time.  His last dialysis was on 05/16/2018.  Past Medical History:  Diagnosis Date  . Alcohol abuse   . CHF (congestive heart failure) ([place])   . Cirrhosis ([place])   . Coronary artery disease 2009  . Drug abuse ([place])   . End stage renal disease on dialysis [facility]) NEPHROLOGIST-   DR [facility]  IN    HEMODIALYSIS --   TUES/  THURS/  SAT  . Gastrointestinal bleed 06/13/2017   From chart...hx of multiple GI bleeds  . GERD (gastroesophageal reflux disease)   . Hyperlipidemia   . Hypertension   . PAD (peripheral artery disease) ([place])   . Renal insufficiency    Per pt, 32 oz fluid restriction per day  . S/P triple vessel bypass 06/09/2016   2009ish  . Suicidal ideation    & HOMICIDAL IDEATION --  06-16-2013   ADMITTED TO BEHAVIOR HEALTH    Patient Active Problem List   Diagnosis Date Noted  . PAD (peripheral artery disease) ([place]) 04/14/2018  . Healthcare-associated pneumonia 01/15/2018  . Acute on chronic respiratory failure ([place]) 01/15/2018  . Acute pulmonary edema ([place]) 12/23/2017  . Acute respiratory failure ([place]) 10/29/2017  . ESRD (end stage renal disease) on dialysis ([place]) 07/28/2017  . Protein-calorie malnutrition, severe 06/14/2017  . Encounter for dialysis [facility])   . Palliative care by specialist   . Goals of care, counseling/discussion   . Malnutrition of moderate degree 06/05/2017  . Secondary esophageal varices without  bleeding ([place])   . Stomach irritation   . Idiopathic esophageal varices without bleeding ([place])   . Alcoholic hepatitis with ascites 05/24/2017  . ESRD (end stage renal disease) ([place]) 04/28/2017  . Uremia 03/08/2017  . ESRD on hemodialysis ([place]) 03/03/2017  . Weakness 02/28/2017  . Hypocalcemia 02/22/2017  . Shortness of breath 11/26/2016  . COPD (chronic obstructive pulmonary disease) ([place]) 10/30/2016  . COPD exacerbation ([place]) 10/29/2016  . Anemia   . Heme positive stool   . Ulceration of intestine   . Benign neoplasm of transverse colon   . Acute gastrointestinal hemorrhage   . Esophageal candidiasis ([place])   . Angiodysplasia of intestinal tract   . Acute respiratory failure with hypoxia ([place]) 07/03/2016  . GI bleeding 06/24/2016  . Rectal bleeding 06/14/2016  . Anemia of chronic disease 06/01/2016  . MRSA carrier 06/01/2016  . Chronic renal failure 05/23/2016  . Ischemic heart disease 05/23/2016  . Angiodysplasia of small intestine   . Melena   . Small bowel bleed not requiring more than 4 units of blood in 24 hours, ICU, or surgery   . Anemia due to chronic blood loss   . Abdominal pain 05/05/2016  . Acute posthemorrhagic anemia 04/17/2016  . Gastrointestinal bleed 04/17/2016  . History of esophagogastroduodenoscopy (EGD) 04/17/2016  . Elevated troponin 04/17/2016  . Alcohol abuse 04/17/2016  . Upper GI bleed 01/19/2016  . Blood in stool   . Angiodysplasia of stomach and duodenum with  hemorrhage   . Gastritis   . Reflux esophagitis   . GI bleed 05/16/2015  . Acute GI bleeding   . Symptomatic anemia 04/30/2015  . HTN (hypertension) 04/06/2015  . GERD (gastroesophageal reflux disease) 04/06/2015  . HLD (hyperlipidemia) 04/06/2015  . Dyspnea 04/06/2015  . Cirrhosis of liver with ascites ([place]) 04/06/2015  . Ascites 04/06/2015  . GIB (gastrointestinal bleeding) 03/23/2015  . Homicidal ideation 06/19/2013  . Suicidal intent 06/19/2013  . Homicidal ideations  06/19/2013  . Hyperkalemia 06/16/2013  . Mandible fracture ([place]) 06/05/2013  . Fracture, mandible ([place]) 06/02/2013  . Coronary atherosclerosis of native coronary artery 06/02/2013  . ESRD on dialysis ([place]) 06/02/2013  . Mandible open fracture ([place]) 06/02/2013    Past Surgical History:  Procedure Laterality Date  . A/V FISTULAGRAM Right 06/06/2017   Procedure: A/V FISTULAGRAM;  Surgeon: Katha Cabal, MD;  Location: [place] CV LAB;  Service: Cardiovascular;  Laterality: Right;  . A/V SHUNT INTERVENTION N/A 06/06/2017   Procedure: A/V SHUNT INTERVENTION;  Surgeon: Katha Cabal, MD;  Location: [place] CV LAB;  Service: Cardiovascular;  Laterality: N/A;  . AGILE CAPSULE N/A 06/19/2016   Procedure: AGILE CAPSULE;  Surgeon: [name], MD;  Location: [facility] ENDOSCOPY;  Service: Endoscopy;  Laterality: N/A;  . COLONOSCOPY WITH PROPOFOL N/A 06/18/2016   Procedure: COLONOSCOPY WITH PROPOFOL;  Surgeon: [name], MD;  Location: [facility] ENDOSCOPY;  Service: Endoscopy;  Laterality: N/A;  . COLONOSCOPY WITH PROPOFOL N/A 08/12/2016   Procedure: COLONOSCOPY WITH PROPOFOL;  Surgeon: [name], MD;  Location: [facility] ENDOSCOPY;  Service: Endoscopy;  Laterality: N/A;  . COLONOSCOPY WITH PROPOFOL N/A 05/05/2017   Procedure: COLONOSCOPY WITH PROPOFOL;  Surgeon: Manya Silvas, MD;  Location: [facility] ENDOSCOPY;  Service: Endoscopy;  Laterality: N/A;  . CORONARY ANGIOPLASTY  ?   PT UNABLE TO TELL IF  BEFORE OR AFTER  CABG  . CORONARY ARTERY BYPASS GRAFT  2008  ([place])   3 VESSEL  . DIALYSIS FISTULA CREATION  LAST SURGERY  APPOX  2008  . ENTEROSCOPY N/A 05/10/2016   Procedure: ENTEROSCOPY;  Surgeon: Jerene Bears, MD;  Location: [place];  Service: Gastroenterology;  Laterality: N/A;  . ENTEROSCOPY N/A 08/12/2016   Procedure: ENTEROSCOPY;  Surgeon: [name], MD;  Location: [facility] ENDOSCOPY;  Service: Endoscopy;  Laterality: N/A;  . ENTEROSCOPY Left 06/03/2017   Procedure: ENTEROSCOPY;   Surgeon: Virgel Manifold, MD;  Location: [facility] ENDOSCOPY;  Service: Endoscopy;  Laterality: Left;  Procedure date will ultimately depend on when patient is medically optimized before the procedure, pending hemodialysis and blood transfusions etc. Will place on schedule and change depending on clinical status.   . ENTEROSCOPY N/A 06/05/2017   Procedure: ENTEROSCOPY;  Surgeon: Virgel Manifold, MD;  Location: [facility] ENDOSCOPY;  Service: Endoscopy;  Laterality: N/A;  . ENTEROSCOPY N/A 06/15/2017   Procedure: Push ENTEROSCOPY;  Surgeon: [name], MD;  Location: [facility] ENDOSCOPY;  Service: Endoscopy;  Laterality: N/A;  . ESOPHAGOGASTRODUODENOSCOPY N/A 05/07/2015   Procedure: ESOPHAGOGASTRODUODENOSCOPY (EGD);  Surgeon: Hulen Luster, MD;  Location: [facility] ENDOSCOPY;  Service: Endoscopy;  Laterality: N/A;  . ESOPHAGOGASTRODUODENOSCOPY (EGD) WITH PROPOFOL N/A 05/17/2015   Procedure: ESOPHAGOGASTRODUODENOSCOPY (EGD) WITH PROPOFOL;  Surgeon: [name], MD;  Location: [facility] ENDOSCOPY;  Service: Endoscopy;  Laterality: N/A;  . ESOPHAGOGASTRODUODENOSCOPY (EGD) WITH PROPOFOL N/A 01/20/2016   Procedure: ESOPHAGOGASTRODUODENOSCOPY (EGD) WITH PROPOFOL;  Surgeon: [name], MD;  Location: [facility] ENDOSCOPY;  Service: Endoscopy;  Laterality: N/A;  . ESOPHAGOGASTRODUODENOSCOPY (EGD) WITH PROPOFOL N/A  04/17/2016   Procedure: ESOPHAGOGASTRODUODENOSCOPY (EGD) WITH PROPOFOL;  Surgeon: Lin Landsman, MD;  Location: [facility] ENDOSCOPY;  Service: Gastroenterology;  Laterality: N/A;  . ESOPHAGOGASTRODUODENOSCOPY (EGD) WITH PROPOFOL  05/09/2016   Procedure: ESOPHAGOGASTRODUODENOSCOPY (EGD) WITH PROPOFOL;  Surgeon: Jerene Bears, MD;  Location: [place];  Service: Endoscopy;;  . ESOPHAGOGASTRODUODENOSCOPY (EGD) WITH PROPOFOL N/A 06/16/2016   Procedure: ESOPHAGOGASTRODUODENOSCOPY (EGD) WITH PROPOFOL;  Surgeon: [name], MD;  Location: [facility] ENDOSCOPY;  Service: Endoscopy;  Laterality: N/A;  . ESOPHAGOGASTRODUODENOSCOPY (EGD) WITH  PROPOFOL N/A 05/05/2017   Procedure: ESOPHAGOGASTRODUODENOSCOPY (EGD) WITH PROPOFOL;  Surgeon: Manya Silvas, MD;  Location: [facility] ENDOSCOPY;  Service: Endoscopy;  Laterality: N/A;  . ESOPHAGOGASTRODUODENOSCOPY (EGD) WITH PROPOFOL N/A 06/15/2017   Procedure: ESOPHAGOGASTRODUODENOSCOPY (EGD) WITH PROPOFOL;  Surgeon: [name], MD;  Location: [facility] ENDOSCOPY;  Service: Endoscopy;  Laterality: N/A;  . ESOPHAGOGASTRODUODENOSCOPY (EGD) WITH PROPOFOL N/A 04/27/2018   Procedure: ESOPHAGOGASTRODUODENOSCOPY (EGD) WITH PROPOFOL;  Surgeon: [name], MD;  Location: [facility] ENDOSCOPY;  Service: Endoscopy;  Laterality: N/A;  . GIVENS CAPSULE STUDY N/A 05/07/2016   Procedure: GIVENS CAPSULE STUDY;  Surgeon: Doran Stabler, MD;  Location: [place];  Service: Endoscopy;  Laterality: N/A;  . LOWER EXTREMITY ANGIOGRAPHY Left 04/19/2018   Procedure: LOWER EXTREMITY ANGIOGRAPHY;  Surgeon: Algernon Huxley, MD;  Location: [place] CV LAB;  Service: Cardiovascular;  Laterality: Left;  [place] MANDIBULAR HARDWARE REMOVAL N/A 07/29/2013   Procedure: REMOVAL OF ARCH BARS;  Surgeon: Theodoro Kos, DO;  Location: [place];  Service: Plastics;  Laterality: N/A;  . ORIF MANDIBULAR FRACTURE N/A 06/05/2013   Procedure: REPAIR OF MANDIBULAR FRACTURE x 2 with maxillo-mandibular fixation ;  Surgeon: Theodoro Kos, DO;  Location: [place];  Service: Plastics;  Laterality: N/A;  . PARACENTESIS    . PERIPHERAL ARTERIAL STENT GRAFT Left     Prior to Admission medications   Medication Sig Start Date End Date Taking? Authorizing Provider  acetaminophen (TYLENOL) 325 MG tablet Take 2 tablets (650 mg total) by mouth every 6 (six) hours as needed for mild pain (or Fever >/= 101). 05/07/18   Gouru, Aruna, MD  alum & mag hydroxide-simeth (MAALOX/MYLANTA) 200-200-20 MG/5ML suspension Take 30 mLs by mouth every 4 (four) hours as needed for indigestion. 05/07/18   Nicholes Mango, MD  aspirin EC 81 MG tablet Take 1 tablet (81 mg  total) by mouth daily. 04/20/18 04/20/19  Epifanio Lesches, MD  budesonide-formoterol (SYMBICORT) 160-4.5 MCG/ACT inhaler Inhale 2 puffs into the lungs daily. 05/07/18   Nicholes Mango, MD  calcium acetate (PHOSLO) 667 MG capsule Take 2,001 mg by mouth 3 (three) times daily.    [provider]  docusate sodium (COLACE) 100 MG capsule Take 1 capsule (100 mg total) by mouth 2 (two) times daily as needed for mild constipation. 05/07/18   Nicholes Mango, MD  folic acid (FOLVITE) 1 MG tablet Take 1 tablet (1 mg total) by mouth daily. 10/31/17   Fritzi Mandes, MD  gabapentin (NEURONTIN) 300 MG capsule Take 300 mg by mouth 3 (three) times daily.  08/16/16   [provider]  ipratropium-albuterol (DUONEB) 0.5-2.5 (3) MG/3ML SOLN Take 3 mLs by nebulization every 6 (six) hours as needed. 05/07/18   Nicholes Mango, MD  labetalol (NORMODYNE) 100 MG tablet Take 100 mg by mouth daily.    [provider]  multivitamin (RENA-VIT) TABS tablet Take 1 tablet by mouth at bedtime. 10/31/17   Fritzi Mandes, MD  Nutritional Supplements (FEEDING SUPPLEMENT, NEPRO CARB STEADY,) LIQD Take  237 mLs by mouth 2 (two) times daily between meals. 05/07/18   Nicholes Mango, MD  omeprazole (PRILOSEC) 40 MG capsule Take 1 capsule (40 mg total) by mouth 2 (two) times daily. 05/07/18   Gouru, Illene Silver, MD  oxyCODONE (OXY IR/ROXICODONE) 5 MG immediate release tablet Take 1 tablet (5 mg total) by mouth every 6 (six) hours as needed for moderate pain. 05/07/18   Gouru, Illene Silver, MD  sucralfate (CARAFATE) 1 GM/10ML suspension Take 10 mLs (1 g total) by mouth 4 (four) times daily -  with meals and at bedtime. 05/07/18   Gouru, Illene Silver, MD  tiotropium (SPIRIVA HANDIHALER) 18 MCG inhalation capsule Place 1 capsule (18 mcg total) into inhaler and inhale daily. 05/07/18 08/05/18  Nicholes Mango, MD  vitamin C (VITAMIN C) 250 MG tablet Take 1 tablet (250 mg total) by mouth 2 (two) times daily. 04/20/18   Epifanio Lesches, MD    Allergies Patient has  no known allergies.  Family History  Problem Relation Age of Onset  . Colon cancer Mother   . Cancer Father   . Cancer Sister   . Kidney disease Brother     Social History Social History   Tobacco Use  . Smoking status: Current Every Day Smoker    Packs/day: 0.15    Years: 40.00    Pack years: 6.00    Types: Cigarettes  . Smokeless tobacco: Never Used  Substance Use Topics  . Alcohol use: No    Comment: pt reports quitting after learning about cirrhosis  . Drug use: No    Frequency: 7.0 times per week    Types: Marijuana, Cocaine    Review of Systems  Constitutional: No fever. Eyes: No redness. ENT: No neck pain. Cardiovascular: Denies chest pain. Respiratory: Denies shortness of breath. Gastrointestinal: No vomiting.  Genitourinary: Negative for flank pain.  Musculoskeletal: Negative for back pain. Skin: Negative for rash. Neurological: Negative for headache.   ____________________________________________   PHYSICAL EXAM:  VITAL SIGNS: ED Triage Vitals  Enc Vitals Group     BP 05/19/18 1314 106/66     Pulse Rate 05/19/18 1314 93     Resp 05/19/18 1314 18     Temp 05/19/18 1314 98.1 F (36.7 C)     Temp Source 05/19/18 1314 Oral     SpO2 --      Weight 05/19/18 1316 167 lb (75.8 kg)     Height 05/19/18 1316 6\' 3"  (1.905 m)     Head Circumference --      Peak Flow --      Pain Score 05/19/18 1316 0     Pain Loc --      Pain Edu? --      Excl. in [place]? --     Constitutional: Alert and oriented. Well appearing and in no acute distress. Eyes: Conjunctivae are normal.  Head: Atraumatic. Nose: No congestion/rhinnorhea. Mouth/Throat: Mucous membranes are moist.   Neck: Normal range of motion.  Cardiovascular: Good peripheral circulation. Respiratory: Normal respiratory effort.   Gastrointestinal:  No distention.  Musculoskeletal: Extremities warm and well perfused.  Neurologic:  Normal speech and language. No gross focal neurologic deficits are  appreciated.  Skin:  Skin is warm and dry. No rash noted. Psychiatric: Mood and affect are normal. Speech and behavior are normal.  ____________________________________________   LABS (all labs ordered are listed, but only abnormal results are displayed)  Labs Reviewed  CBC  RENAL FUNCTION PANEL   ____________________________________________  EKG   ____________________________________________  RADIOLOGY  ____________________________________________   PROCEDURES  Procedure(s) performed: No  Procedures  Critical Care performed: No ____________________________________________   INITIAL IMPRESSION / ASSESSMENT AND PLAN / ED COURSE  Pertinent labs & imaging results that were available during my care of the patient were reviewed by me and considered in my medical decision making (see chart for details).  58 year old male with history of ESRD presents for his routine dialysis as per his prior visits.  He denies any acute symptoms.  Last dialysis was on 05/16/2018.  I discussed the patient with Dr. Holley Raring from nephrology who confirmed that dialysis would be completed in the hospital today.  There is no indication for lab work-up at this time.  The plan will be for discharge after dialysis is completed.  ----------------------------------------- 3:34 PM on 05/19/2018 -----------------------------------------  Patient is at dialysis.  ____________________________________________   FINAL CLINICAL IMPRESSION(S) / ED DIAGNOSES  Final diagnoses:  End stage renal disease ([place])      NEW MEDICATIONS STARTED DURING THIS VISIT:  New Prescriptions   No medications on file     Note:  This document was prepared using Dragon voice recognition software and may include unintentional dictation errors.    Arta Silence, MD 05/19/18 1534

## 2018-05-19 NOTE — ED Provider Notes (Signed)
-----------------------------------------   6:48 PM on 05/19/2018 -----------------------------------------   Patient return to the ED from dialysis.  He reports that he feels fine, no complaints.  He is unwilling to stay in the ED any longer for further questioning or exam and is walking out while I talk to him.  He left before discharge instructions could be provided.  He understands he can return at any time.   Carrie Mew, MD 05/19/18 [id number]

## 2018-05-21 ENCOUNTER — Telehealth (INDEPENDENT_AMBULATORY_CARE_PROVIDER_SITE_OTHER): Payer: Self-pay | Admitting: Vascular Surgery

## 2018-05-21 NOTE — Telephone Encounter (Signed)
Patient call requesting pain medication. Patient had angio on 04/19/18. Please advise. AS, CMA

## 2018-05-21 NOTE — Telephone Encounter (Signed)
Patient is aware that we can not prescribe any pain medication. Patient was advised to schedule apt for follow in future. Message being routed to front desk for scheduling. Patient aware. AS, CMA Can you please call patient to schedule apt?

## 2018-05-22 ENCOUNTER — Other Ambulatory Visit: Payer: Self-pay

## 2018-05-22 ENCOUNTER — Telehealth (INDEPENDENT_AMBULATORY_CARE_PROVIDER_SITE_OTHER): Payer: Self-pay | Admitting: Vascular Surgery

## 2018-05-22 ENCOUNTER — Encounter: Payer: Self-pay | Admitting: Emergency Medicine

## 2018-05-22 ENCOUNTER — Non-Acute Institutional Stay
Admission: EM | Admit: 2018-05-22 | Discharge: 2018-05-22 | Disposition: A | Discharge status: Home / Self Care | Payer: Medicare Other | Attending: Emergency Medicine | Admitting: Emergency Medicine

## 2018-05-22 DIAGNOSIS — K7011 Alcoholic hepatitis with ascites: Secondary | ICD-10-CM | POA: Diagnosis not present

## 2018-05-22 DIAGNOSIS — Q2733 Arteriovenous malformation of digestive system vessel: Secondary | ICD-10-CM | POA: Diagnosis not present

## 2018-05-22 DIAGNOSIS — E875 Hyperkalemia: Secondary | ICD-10-CM | POA: Diagnosis not present

## 2018-05-22 DIAGNOSIS — I272 Pulmonary hypertension, unspecified: Secondary | ICD-10-CM | POA: Insufficient documentation

## 2018-05-22 DIAGNOSIS — Q8781 Alport syndrome: Secondary | ICD-10-CM | POA: Diagnosis not present

## 2018-05-22 DIAGNOSIS — D631 Anemia in chronic kidney disease: Secondary | ICD-10-CM | POA: Insufficient documentation

## 2018-05-22 DIAGNOSIS — I739 Peripheral vascular disease, unspecified: Secondary | ICD-10-CM | POA: Insufficient documentation

## 2018-05-22 DIAGNOSIS — I132 Hypertensive heart and chronic kidney disease with heart failure and with stage 5 chronic kidney disease, or end stage renal disease: Secondary | ICD-10-CM | POA: Insufficient documentation

## 2018-05-22 DIAGNOSIS — Z992 Dependence on renal dialysis: Secondary | ICD-10-CM | POA: Diagnosis not present

## 2018-05-22 DIAGNOSIS — N2581 Secondary hyperparathyroidism of renal origin: Secondary | ICD-10-CM | POA: Diagnosis not present

## 2018-05-22 DIAGNOSIS — N186 End stage renal disease: Secondary | ICD-10-CM | POA: Diagnosis not present

## 2018-05-22 DIAGNOSIS — K7290 Hepatic failure, unspecified without coma: Secondary | ICD-10-CM | POA: Diagnosis not present

## 2018-05-22 DIAGNOSIS — I2510 Atherosclerotic heart disease of native coronary artery without angina pectoris: Secondary | ICD-10-CM | POA: Diagnosis not present

## 2018-05-22 DIAGNOSIS — E785 Hyperlipidemia, unspecified: Secondary | ICD-10-CM | POA: Insufficient documentation

## 2018-05-22 DIAGNOSIS — I120 Hypertensive chronic kidney disease with stage 5 chronic kidney disease or end stage renal disease: Secondary | ICD-10-CM | POA: Diagnosis not present

## 2018-05-22 MED ORDER — CHLORHEXIDINE GLUCONATE CLOTH 2 % EX PADS: 6.0000 | MEDICATED_PAD | Freq: Every day | CUTANEOUS | Status: DC

## 2018-05-22 MED ORDER — MORPHINE SULFATE (PF) 2 MG/ML IV SOLN
2.0000 mg | Freq: Once | INTRAVENOUS | Status: AC
  Administered 2018-05-22: 2 mg via INTRAVENOUS
  Filled 2018-05-22: qty 1

## 2018-05-22 MED ORDER — DIPHENHYDRAMINE HCL 25 MG PO CAPS
25.0000 mg | ORAL_CAPSULE | Freq: Once | ORAL | Status: DC
  Filled 2018-05-22: qty 1

## 2018-05-22 MED ORDER — EPOETIN ALFA 10000 UNIT/ML IJ SOLN
10000.0000 [IU] | Freq: Once | INTRAMUSCULAR | Status: AC
  Administered 2018-05-22: 10000 [IU] via INTRAVENOUS

## 2018-05-22 NOTE — ED Notes (Signed)
Per dialysis - pt had 3.5 liters removed today over 3.5 hours.NAD at this time. Pt reported left foot pain and was given 2mg  of Morphine.   116/81 HR 90 NSR 100% RA.

## 2018-05-22 NOTE — ED Provider Notes (Signed)
Patient is back from routine dialysis.  Hemodynamically stable and appropriate for discharge home.   Merlyn Lot, MD 05/22/18 [phone]

## 2018-05-22 NOTE — Progress Notes (Signed)
Hemodialysis: initiated via R Chest hd catheter without issue. Dressing change. Patient reports  Itching and 9/10 pain in left foot. Dr. [name] notified. Orders received. Will medicate. No other current complaints other than states "I feel tired all the time, I cant walk." Continue to monitor

## 2018-05-22 NOTE — ED Notes (Signed)
Dialysis notified of patient arrival; states they will contact nephrologist to receive orders.

## 2018-05-22 NOTE — Progress Notes (Signed)
Hemodialysis completed without issue. Patient tolerated well. UF 3L as ordered. Patient also received 2mg  morphine for severe pain as well as epogen for low hemoglobin per Dr. [name]. Patient currently without complaints. Report given to charge RN in ED. All vitals stable. Patient left HD unit in stable condition.

## 2018-05-22 NOTE — ED Triage Notes (Signed)
Pt arrives via POV for scheduled dialysis.

## 2018-05-22 NOTE — ED Provider Notes (Signed)
[facility] Emergency Department Provider Note       Time seen: ----------------------------------------- 1:27 PM on 05/22/2018 -----------------------------------------   I have reviewed the triage vital signs and the nursing notes.  HISTORY   Chief Complaint Dialysis    HPI Stanley [name] is a 58 y.o. male with a history of alcohol abuse, CHF, cirrhosis, coronary disease, end-stage renal disease on dialysis with no noncompliance who presents to the ED for dialysis.  Patient cannot tell me when the last time he was that he had dialysis.  He denies any complaints at this time.  Past Medical History:  Diagnosis Date  . Alcohol abuse   . CHF (congestive heart failure) ([place])   . Cirrhosis ([place])   . Coronary artery disease 2009  . Drug abuse ([place])   . End stage renal disease on dialysis [facility]) NEPHROLOGIST-   DR [facility]  IN [place]   HEMODIALYSIS --   TUES/  THURS/  SAT  . Gastrointestinal bleed 06/13/2017   From chart...hx of multiple GI bleeds  . GERD (gastroesophageal reflux disease)   . Hyperlipidemia   . Hypertension   . PAD (peripheral artery disease) ([place])   . Renal insufficiency    Per pt, 32 oz fluid restriction per day  . S/P triple vessel bypass 06/09/2016   2009ish  . Suicidal ideation    & HOMICIDAL IDEATION --  06-16-2013   ADMITTED TO BEHAVIOR HEALTH    Patient Active Problem List   Diagnosis Date Noted  . PAD (peripheral artery disease) ([place]) 04/14/2018  . Healthcare-associated pneumonia 01/15/2018  . Acute on chronic respiratory failure ([place]) 01/15/2018  . Acute pulmonary edema ([place]) 12/23/2017  . Acute respiratory failure ([place]) 10/29/2017  . ESRD (end stage renal disease) on dialysis ([place]) 07/28/2017  . Protein-calorie malnutrition, severe 06/14/2017  . Encounter for dialysis [facility])   . Palliative care by specialist   . Goals of care, counseling/discussion   . Malnutrition of moderate degree 06/05/2017  . Secondary  esophageal varices without bleeding ([place])   . Stomach irritation   . Idiopathic esophageal varices without bleeding ([place])   . Alcoholic hepatitis with ascites 05/24/2017  . ESRD (end stage renal disease) ([place]) 04/28/2017  . Uremia 03/08/2017  . ESRD on hemodialysis ([place]) 03/03/2017  . Weakness 02/28/2017  . Hypocalcemia 02/22/2017  . Shortness of breath 11/26/2016  . COPD (chronic obstructive pulmonary disease) ([place]) 10/30/2016  . COPD exacerbation ([place]) 10/29/2016  . Anemia   . Heme positive stool   . Ulceration of intestine   . Benign neoplasm of transverse colon   . Acute gastrointestinal hemorrhage   . Esophageal candidiasis ([place])   . Angiodysplasia of intestinal tract   . Acute respiratory failure with hypoxia ([place]) 07/03/2016  . GI bleeding 06/24/2016  . Rectal bleeding 06/14/2016  . Anemia of chronic disease 06/01/2016  . MRSA carrier 06/01/2016  . Chronic renal failure 05/23/2016  . Ischemic heart disease 05/23/2016  . Angiodysplasia of small intestine   . Melena   . Small bowel bleed not requiring more than 4 units of blood in 24 hours, ICU, or surgery   . Anemia due to chronic blood loss   . Abdominal pain 05/05/2016  . Acute posthemorrhagic anemia 04/17/2016  . Gastrointestinal bleed 04/17/2016  . History of esophagogastroduodenoscopy (EGD) 04/17/2016  . Elevated troponin 04/17/2016  . Alcohol abuse 04/17/2016  . Upper GI bleed 01/19/2016  . Blood in stool   . Angiodysplasia of stomach and duodenum with hemorrhage   .  Gastritis   . Reflux esophagitis   . GI bleed 05/16/2015  . Acute GI bleeding   . Symptomatic anemia 04/30/2015  . HTN (hypertension) 04/06/2015  . GERD (gastroesophageal reflux disease) 04/06/2015  . HLD (hyperlipidemia) 04/06/2015  . Dyspnea 04/06/2015  . Cirrhosis of liver with ascites ([place]) 04/06/2015  . Ascites 04/06/2015  . GIB (gastrointestinal bleeding) 03/23/2015  . Homicidal ideation 06/19/2013  . Suicidal intent 06/19/2013  .  Homicidal ideations 06/19/2013  . Hyperkalemia 06/16/2013  . Mandible fracture ([place]) 06/05/2013  . Fracture, mandible ([place]) 06/02/2013  . Coronary atherosclerosis of native coronary artery 06/02/2013  . ESRD on dialysis ([place]) 06/02/2013  . Mandible open fracture ([place]) 06/02/2013    Past Surgical History:  Procedure Laterality Date  . A/V FISTULAGRAM Right 06/06/2017   Procedure: A/V FISTULAGRAM;  Surgeon: Katha Cabal, MD;  Location: [place] CV LAB;  Service: Cardiovascular;  Laterality: Right;  . A/V SHUNT INTERVENTION N/A 06/06/2017   Procedure: A/V SHUNT INTERVENTION;  Surgeon: Katha Cabal, MD;  Location: [place] CV LAB;  Service: Cardiovascular;  Laterality: N/A;  . AGILE CAPSULE N/A 06/19/2016   Procedure: AGILE CAPSULE;  Surgeon: [name], MD;  Location: [facility] ENDOSCOPY;  Service: Endoscopy;  Laterality: N/A;  . COLONOSCOPY WITH PROPOFOL N/A 06/18/2016   Procedure: COLONOSCOPY WITH PROPOFOL;  Surgeon: [name], MD;  Location: [facility] ENDOSCOPY;  Service: Endoscopy;  Laterality: N/A;  . COLONOSCOPY WITH PROPOFOL N/A 08/12/2016   Procedure: COLONOSCOPY WITH PROPOFOL;  Surgeon: [name], MD;  Location: [facility] ENDOSCOPY;  Service: Endoscopy;  Laterality: N/A;  . COLONOSCOPY WITH PROPOFOL N/A 05/05/2017   Procedure: COLONOSCOPY WITH PROPOFOL;  Surgeon: Manya Silvas, MD;  Location: [facility] ENDOSCOPY;  Service: Endoscopy;  Laterality: N/A;  . CORONARY ANGIOPLASTY  ?   PT UNABLE TO TELL IF  BEFORE OR AFTER  CABG  . CORONARY ARTERY BYPASS GRAFT  2008  ([place] , [place])   3 VESSEL  . DIALYSIS FISTULA CREATION  LAST SURGERY  APPOX  2008  . ENTEROSCOPY N/A 05/10/2016   Procedure: ENTEROSCOPY;  Surgeon: Jerene Bears, MD;  Location: [place];  Service: Gastroenterology;  Laterality: N/A;  . ENTEROSCOPY N/A 08/12/2016   Procedure: ENTEROSCOPY;  Surgeon: [name], MD;  Location: [facility] ENDOSCOPY;  Service: Endoscopy;  Laterality: N/A;  . ENTEROSCOPY Left 06/03/2017    Procedure: ENTEROSCOPY;  Surgeon: Virgel Manifold, MD;  Location: [facility] ENDOSCOPY;  Service: Endoscopy;  Laterality: Left;  Procedure date will ultimately depend on when patient is medically optimized before the procedure, pending hemodialysis and blood transfusions etc. Will place on schedule and change depending on clinical status.   . ENTEROSCOPY N/A 06/05/2017   Procedure: ENTEROSCOPY;  Surgeon: Virgel Manifold, MD;  Location: [facility] ENDOSCOPY;  Service: Endoscopy;  Laterality: N/A;  . ENTEROSCOPY N/A 06/15/2017   Procedure: Push ENTEROSCOPY;  Surgeon: [name], MD;  Location: [facility] ENDOSCOPY;  Service: Endoscopy;  Laterality: N/A;  . ESOPHAGOGASTRODUODENOSCOPY N/A 05/07/2015   Procedure: ESOPHAGOGASTRODUODENOSCOPY (EGD);  Surgeon: Hulen Luster, MD;  Location: [facility] ENDOSCOPY;  Service: Endoscopy;  Laterality: N/A;  . ESOPHAGOGASTRODUODENOSCOPY (EGD) WITH PROPOFOL N/A 05/17/2015   Procedure: ESOPHAGOGASTRODUODENOSCOPY (EGD) WITH PROPOFOL;  Surgeon: [name], MD;  Location: [facility] ENDOSCOPY;  Service: Endoscopy;  Laterality: N/A;  . ESOPHAGOGASTRODUODENOSCOPY (EGD) WITH PROPOFOL N/A 01/20/2016   Procedure: ESOPHAGOGASTRODUODENOSCOPY (EGD) WITH PROPOFOL;  Surgeon: [name], MD;  Location: [facility] ENDOSCOPY;  Service: Endoscopy;  Laterality: N/A;  . ESOPHAGOGASTRODUODENOSCOPY (EGD) WITH PROPOFOL N/A 04/17/2016   Procedure:  ESOPHAGOGASTRODUODENOSCOPY (EGD) WITH PROPOFOL;  Surgeon: Lin Landsman, MD;  Location: [facility] ENDOSCOPY;  Service: Gastroenterology;  Laterality: N/A;  . ESOPHAGOGASTRODUODENOSCOPY (EGD) WITH PROPOFOL  05/09/2016   Procedure: ESOPHAGOGASTRODUODENOSCOPY (EGD) WITH PROPOFOL;  Surgeon: Jerene Bears, MD;  Location: [place];  Service: Endoscopy;;  . ESOPHAGOGASTRODUODENOSCOPY (EGD) WITH PROPOFOL N/A 06/16/2016   Procedure: ESOPHAGOGASTRODUODENOSCOPY (EGD) WITH PROPOFOL;  Surgeon: [name], MD;  Location: [facility] ENDOSCOPY;  Service: Endoscopy;  Laterality: N/A;  .  ESOPHAGOGASTRODUODENOSCOPY (EGD) WITH PROPOFOL N/A 05/05/2017   Procedure: ESOPHAGOGASTRODUODENOSCOPY (EGD) WITH PROPOFOL;  Surgeon: Manya Silvas, MD;  Location: [facility] ENDOSCOPY;  Service: Endoscopy;  Laterality: N/A;  . ESOPHAGOGASTRODUODENOSCOPY (EGD) WITH PROPOFOL N/A 06/15/2017   Procedure: ESOPHAGOGASTRODUODENOSCOPY (EGD) WITH PROPOFOL;  Surgeon: [name], MD;  Location: [facility] ENDOSCOPY;  Service: Endoscopy;  Laterality: N/A;  . ESOPHAGOGASTRODUODENOSCOPY (EGD) WITH PROPOFOL N/A 04/27/2018   Procedure: ESOPHAGOGASTRODUODENOSCOPY (EGD) WITH PROPOFOL;  Surgeon: [name], MD;  Location: [facility] ENDOSCOPY;  Service: Endoscopy;  Laterality: N/A;  . GIVENS CAPSULE STUDY N/A 05/07/2016   Procedure: GIVENS CAPSULE STUDY;  Surgeon: Doran Stabler, MD;  Location: [place];  Service: Endoscopy;  Laterality: N/A;  . LOWER EXTREMITY ANGIOGRAPHY Left 04/19/2018   Procedure: LOWER EXTREMITY ANGIOGRAPHY;  Surgeon: Algernon Huxley, MD;  Location: [place] CV LAB;  Service: Cardiovascular;  Laterality: Left;  Marland Kitchen MANDIBULAR HARDWARE REMOVAL N/A 07/29/2013   Procedure: REMOVAL OF ARCH BARS;  Surgeon: Theodoro Kos, DO;  Location: [place];  Service: Plastics;  Laterality: N/A;  . ORIF MANDIBULAR FRACTURE N/A 06/05/2013   Procedure: REPAIR OF MANDIBULAR FRACTURE x 2 with maxillo-mandibular fixation ;  Surgeon: Theodoro Kos, DO;  Location: [place];  Service: Plastics;  Laterality: N/A;  . PARACENTESIS    . PERIPHERAL ARTERIAL STENT GRAFT Left     Allergies Patient has no known allergies.  Social History Social History   Tobacco Use  . Smoking status: Current Every Day Smoker    Packs/day: 0.15    Years: 40.00    Pack years: 6.00    Types: Cigarettes  . Smokeless tobacco: Never Used  Substance Use Topics  . Alcohol use: No    Comment: pt reports quitting after learning about cirrhosis  . Drug use: No    Frequency: 7.0 times per week    Types: Marijuana, Cocaine   Review of  Systems Constitutional: Negative for fever. Cardiovascular: Negative for chest pain. Respiratory: Negative for shortness of breath. Gastrointestinal: Negative for abdominal pain, vomiting and diarrhea. Musculoskeletal: Negative for back pain. Skin: Negative for rash. Neurological: Negative for headaches, focal weakness or numbness.  All systems negative/normal/unremarkable except as stated in the HPI  ____________________________________________   PHYSICAL EXAM:  VITAL SIGNS: ED Triage Vitals  Enc Vitals Group     BP 05/22/18 1308 113/62     Pulse Rate 05/22/18 1308 80     Resp 05/22/18 1308 16     Temp 05/22/18 1308 98.1 F (36.7 C)     Temp Source 05/22/18 1308 Oral     SpO2 05/22/18 1308 100 %     Weight 05/22/18 1308 167 lb 1.7 oz (75.8 kg)     Height --      Head Circumference --      Peak Flow --      Pain Score 05/22/18 1306 9     Pain Loc --      Pain Edu? --      Excl. in [place]? --  Constitutional: Alert and oriented. Well appearing and in no distress. Cardiovascular: Normal rate, regular rhythm. No murmurs, rubs, or gallops. Respiratory: Normal respiratory effort without tachypnea nor retractions. Breath sounds are clear and equal bilaterally. No wheezes/rales/rhonchi. Gastrointestinal: Soft and nontender. Normal bowel sounds Musculoskeletal: Nontender with normal range of motion in extremities. No lower extremity tenderness nor edema. Neurologic:  Normal speech and language. No gross focal neurologic deficits are appreciated.  Skin:  Skin is warm, dry and intact. No rash noted. Psychiatric: Mood and affect are normal. Speech and behavior are normal.  ____________________________________________  ED COURSE:  As part of my medical decision making, I reviewed the following data within the [place] History obtained from family if available, nursing notes, old chart and ekg, as well as notes from prior ED visits. Patient presented for dialysis,  patient's vital signs are stable, he appears medically clear for dialysis.   Procedures  ____________________________________________   DIFFERENTIAL DIAGNOSIS   End-stage renal disease on dialysis, noncompliance  FINAL ASSESSMENT AND PLAN  End-stage renal disease on dialysis   Plan: The patient had presented for dialysis although he is not sure when the last time was that he had dialysis.  I will discuss with nephrology, he appears medically cleared at this time.   Laurence Aly, MD    Note: This note was generated in part or whole with voice recognition software. Voice recognition is usually quite accurate but there are transcription errors that can and very often do occur. I apologize for any typographical errors that were not detected and corrected.     Earleen Newport, MD 05/22/18 1328

## 2018-05-22 NOTE — Progress Notes (Signed)
[facility]  ROUNDING NOTE   Subjective:   Patient presents to the emergency room.  He has not had his dialysis for an unknown period of time.  He does not have an outpatient dialysis unit due to lack of transportation.    HEMODIALYSIS FLOWSHEET:  Blood Flow Rate (mL/min): 400 mL/min Arterial Pressure (mmHg): -140 mmHg Venous Pressure (mmHg): 140 mmHg Transmembrane Pressure (mmHg): 60 mmHg Ultrafiltration Rate (mL/min): 1040 mL/min Dialysate Flow Rate (mL/min): 800 ml/min Conductivity: Machine : 14 Conductivity: Machine : 14 Dialysis Fluid Bolus: Normal Saline Bolus Amount (mL): 250 mL Dialysate Change: 2K  Last lab results from March 7 show elevated potassium of 5.4 and severe anemia, hemoglobin of 6.7  Objective:  Vital signs in last 24 hours:  Temp:  [98 F (36.7 C)-98.1 F (36.7 C)] 98 F (36.7 C) (03/10 1401) Pulse Rate:  [80-95] 90 (03/10 1515) Resp:  [12-18] 18 (03/10 1515) BP: (107-116)/(62-84) 113/72 (03/10 1515) SpO2:  [100 %] 100 % (03/10 1445) Weight:  [75.8 kg] 75.8 kg (03/10 1401)  Weight change:  Filed Weights   05/22/18 1308 05/22/18 1401  Weight: 75.8 kg 75.8 kg    Intake/Output: No intake/output data recorded.   Intake/Output this shift:  No intake/output data recorded.  Physical Exam: General: NAD, sitting in chair  Head: Normocephalic, atraumatic. Moist oral mucosal membranes  Neck: Supple,   Lungs:   Normal breathing effort on room air  Heart: Regular rate and rhythm, sinus rhythm on telemetry  Abdomen:  Soft, nontender,   Extremities: 1+ peripheral edema,    Neurologic: Nonfocal, moving all four extremities  Skin: No lesions  Access: RIJ permcath    Basic Metabolic Panel: Recent Labs  Lab 05/16/18 1527 05/19/18 1440  NA 138 139  K 4.8 5.4*  CL 91* 91*  CO2 33* 35*  GLUCOSE 109* 156*  BUN 36* 35*  CREATININE 6.57* 8.78*  CALCIUM 8.1* 8.1*  PHOS 3.8  3.8 6.1*    Liver Function Tests: Recent Labs  Lab  05/16/18 1527 05/19/18 1440  ALBUMIN 2.5* 2.3*   No results for input(s): LIPASE, AMYLASE in the last 168 hours. No results for input(s): AMMONIA in the last 168 hours.  CBC: Recent Labs  Lab 05/16/18 1527 05/19/18 1440  WBC 6.2 6.7  HGB 6.5* 6.7*  HCT 23.2* 23.5*  MCV 90.6 90.4  PLT 376 344    Cardiac Enzymes: No results for input(s): CKTOTAL, CKMB, CKMBINDEX, TROPONINI in the last 168 hours.  BNP: Invalid input(s): POCBNP  CBG: No results for input(s): GLUCAP in the last 168 hours.  Microbiology: Results for orders placed or performed during the hospital encounter of 04/26/18  MRSA PCR Screening     Status: None   Collection Time: 04/26/18  1:09 PM  Result Value Ref Range Status   MRSA by PCR NEGATIVE NEGATIVE Final    Comment:        The GeneXpert MRSA Assay (FDA approved for NASAL specimens only), is one component of a comprehensive MRSA colonization surveillance program. It is not intended to diagnose MRSA infection nor to guide or monitor treatment for MRSA infections. Performed at [facility], [address] [place], [place] [address]     Coagulation Studies: No results for input(s): LABPROT, INR in the last 72 hours.  Urinalysis: No results for input(s): COLORURINE, LABSPEC, PHURINE, GLUCOSEU, HGBUR, BILIRUBINUR, KETONESUR, PROTEINUR, UROBILINOGEN, NITRITE, LEUKOCYTESUR in the last 72 hours.  Invalid input(s): APPERANCEUR    Imaging: No results found.  Medications:    . [START ON 05/23/2018] Chlorhexidine Gluconate Cloth  6 each Topical Q0600  . diphenhydrAMINE  25 mg Oral Once  .  morphine injection  2 mg Intravenous Once     Assessment/ Plan:  Mr. Maya Scholer is a 58 y.o. black male withend stage renal disease on hemodialysis secondary to Alport's syndrome, end stage liver disease with ascites, hypertension, peripheral vascular disease, coronary artery disease, hyperlipidemia, gastrointestinal AVMs, pulmonary  hypertension  [facility] ([facility]) RIJ permcath.210 min, currently no transportation to dialysis unit  1. Hyperkalemia - 2 K bath on hemodialysis  2. End Stage Renal Disease: on hemodialysis. Unable to get to his outpatient clinic. Unfortunately no other local clinic is willing to accept patient at this time. Patient is now getting his dialysis at [facility].  Seen and examined on hemodialysis treatment. Tolerating treatment well.  3. Anemia of chronic kidney disease: with multiple GI bleeds.  - EPO with HD treatment.   4. Secondary Hyperparathyroidism: PTH 597 on 03/26/18.  Phosphorus 6.1   5. Ascites- Last paracentesis feb 14.  7.3 L fluid was drawn   LOS: 0 [facility] 3/10/20203:53 PM

## 2018-05-25 ENCOUNTER — Non-Acute Institutional Stay
Admission: EM | Admit: 2018-05-25 | Discharge: 2018-05-25 | Disposition: A | Discharge status: Home / Self Care | Payer: Medicare Other | Attending: Emergency Medicine | Admitting: Emergency Medicine

## 2018-05-25 ENCOUNTER — Encounter: Payer: Self-pay | Admitting: Emergency Medicine

## 2018-05-25 ENCOUNTER — Other Ambulatory Visit: Payer: Self-pay

## 2018-05-25 DIAGNOSIS — N2581 Secondary hyperparathyroidism of renal origin: Secondary | ICD-10-CM | POA: Insufficient documentation

## 2018-05-25 DIAGNOSIS — Z992 Dependence on renal dialysis: Secondary | ICD-10-CM | POA: Diagnosis not present

## 2018-05-25 DIAGNOSIS — K7460 Unspecified cirrhosis of liver: Secondary | ICD-10-CM | POA: Insufficient documentation

## 2018-05-25 DIAGNOSIS — N186 End stage renal disease: Secondary | ICD-10-CM | POA: Diagnosis not present

## 2018-05-25 DIAGNOSIS — F1010 Alcohol abuse, uncomplicated: Secondary | ICD-10-CM | POA: Diagnosis not present

## 2018-05-25 DIAGNOSIS — Z951 Presence of aortocoronary bypass graft: Secondary | ICD-10-CM | POA: Diagnosis not present

## 2018-05-25 DIAGNOSIS — I132 Hypertensive heart and chronic kidney disease with heart failure and with stage 5 chronic kidney disease, or end stage renal disease: Secondary | ICD-10-CM | POA: Diagnosis not present

## 2018-05-25 DIAGNOSIS — D631 Anemia in chronic kidney disease: Secondary | ICD-10-CM | POA: Diagnosis not present

## 2018-05-25 DIAGNOSIS — F17210 Nicotine dependence, cigarettes, uncomplicated: Secondary | ICD-10-CM | POA: Insufficient documentation

## 2018-05-25 DIAGNOSIS — J449 Chronic obstructive pulmonary disease, unspecified: Secondary | ICD-10-CM | POA: Diagnosis not present

## 2018-05-25 DIAGNOSIS — Z22322 Carrier or suspected carrier of Methicillin resistant Staphylococcus aureus: Secondary | ICD-10-CM | POA: Insufficient documentation

## 2018-05-25 DIAGNOSIS — Z7951 Long term (current) use of inhaled steroids: Secondary | ICD-10-CM | POA: Diagnosis not present

## 2018-05-25 DIAGNOSIS — I2510 Atherosclerotic heart disease of native coronary artery without angina pectoris: Secondary | ICD-10-CM | POA: Diagnosis not present

## 2018-05-25 DIAGNOSIS — K210 Gastro-esophageal reflux disease with esophagitis: Secondary | ICD-10-CM | POA: Diagnosis not present

## 2018-05-25 DIAGNOSIS — E785 Hyperlipidemia, unspecified: Secondary | ICD-10-CM | POA: Diagnosis not present

## 2018-05-25 DIAGNOSIS — R188 Other ascites: Secondary | ICD-10-CM | POA: Diagnosis not present

## 2018-05-25 DIAGNOSIS — Z7982 Long term (current) use of aspirin: Secondary | ICD-10-CM | POA: Diagnosis not present

## 2018-05-25 DIAGNOSIS — K7011 Alcoholic hepatitis with ascites: Secondary | ICD-10-CM | POA: Diagnosis not present

## 2018-05-25 DIAGNOSIS — Z79899 Other long term (current) drug therapy: Secondary | ICD-10-CM | POA: Insufficient documentation

## 2018-05-25 DIAGNOSIS — I272 Pulmonary hypertension, unspecified: Secondary | ICD-10-CM | POA: Diagnosis not present

## 2018-05-25 DIAGNOSIS — Z4931 Encounter for adequacy testing for hemodialysis: Secondary | ICD-10-CM | POA: Diagnosis not present

## 2018-05-25 DIAGNOSIS — E875 Hyperkalemia: Secondary | ICD-10-CM | POA: Diagnosis not present

## 2018-05-25 LAB — RENAL FUNCTION PANEL
Albumin: 2.6 g/dL — ABNORMAL LOW (ref 3.5–5.0)
Anion gap: 14 (ref 5–15)
BUN: 19 mg/dL (ref 6–20)
CO2: 33 mmol/L — ABNORMAL HIGH (ref 22–32)
Calcium: 8.5 mg/dL — ABNORMAL LOW (ref 8.9–10.3)
Chloride: 93 mmol/L — ABNORMAL LOW (ref 98–111)
Creatinine, Ser: 5.28 mg/dL — ABNORMAL HIGH (ref 0.61–1.24)
GFR calc Af Amer: 13 mL/min — ABNORMAL LOW (ref >60)
GFR calc non Af Amer: 11 mL/min — ABNORMAL LOW (ref >60)
Glucose, Bld: 113 mg/dL — ABNORMAL HIGH (ref 70–99)
POTASSIUM: 4.3 mmol/L (ref 3.5–5.1)
Phosphorus: 3.3 mg/dL (ref 2.5–4.6)
Sodium: 140 mmol/L (ref 135–145)

## 2018-05-25 LAB — CBC
HCT: 24.4 % — ABNORMAL LOW (ref 39.0–52.0)
HEMOGLOBIN: 6.7 g/dL — AB (ref 13.0–17.0)
MCH: 24.5 pg — ABNORMAL LOW (ref 26.0–34.0)
MCHC: 27.5 g/dL — ABNORMAL LOW (ref 30.0–36.0)
MCV: 89.1 fL (ref 80.0–100.0)
Platelets: 304 10*3/uL (ref 150–400)
RBC: 2.74 MIL/uL — ABNORMAL LOW (ref 4.22–5.81)
RDW: 18 % — ABNORMAL HIGH (ref 11.5–15.5)
WBC: 7.1 10*3/uL (ref 4.0–10.5)
nRBC: 0 % (ref 0.0–0.2)

## 2018-05-25 MED ORDER — HEPARIN SODIUM (PORCINE) 1000 UNIT/ML DIALYSIS: 1000.0000 [IU] | INTRAMUSCULAR | Status: DC | PRN

## 2018-05-25 MED ORDER — SODIUM CHLORIDE 0.9 % IV SOLN: 100.0000 mL | INTRAVENOUS | Status: DC | PRN

## 2018-05-25 MED ORDER — CHLORHEXIDINE GLUCONATE CLOTH 2 % EX PADS: 6.0000 | MEDICATED_PAD | Freq: Every day | CUTANEOUS | Status: DC

## 2018-05-25 MED ORDER — EPOETIN ALFA 10000 UNIT/ML IJ SOLN
4000.0000 [IU] | Freq: Once | INTRAMUSCULAR | Status: AC
  Administered 2018-05-25: 4000 [IU] via INTRAVENOUS

## 2018-05-25 MED ORDER — MORPHINE SULFATE (PF) 2 MG/ML IV SOLN
2.0000 mg | Freq: Once | INTRAVENOUS | Status: AC
  Administered 2018-05-25: 2 mg via INTRAVENOUS
  Filled 2018-05-25: qty 1

## 2018-05-25 MED ORDER — ALTEPLASE 2 MG IJ SOLR: 2.0000 mg | Freq: Once | INTRAMUSCULAR | Status: DC | PRN

## 2018-05-25 NOTE — ED Provider Notes (Signed)
[facility] Emergency Department Provider Note   ____________________________________________    I have reviewed the triage vital signs and the nursing notes.   HISTORY  Chief Complaint Needs dialysis    HPI Stanley [name] is a 58 y.o. male with history as noted below who presents to the emergency department for his routine dialysis.  Patient receives dialysis Monday Wednesday Friday, unfortunately this point he continues to receive dialysis via the emergency department.  He denies shortness of breath, reports he feels "good ".  Past Medical History:  Diagnosis Date  . Alcohol abuse   . CHF (congestive heart failure) ([place])   . Cirrhosis ([place])   . Coronary artery disease 2009  . Drug abuse ([place])   . End stage renal disease on dialysis [facility]) NEPHROLOGIST-   DR [facility]  IN [place]   HEMODIALYSIS --   TUES/  THURS/  SAT  . Gastrointestinal bleed 06/13/2017   From chart...hx of multiple GI bleeds  . GERD (gastroesophageal reflux disease)   . Hyperlipidemia   . Hypertension   . PAD (peripheral artery disease) ([place])   . Renal insufficiency    Per pt, 32 oz fluid restriction per day  . S/P triple vessel bypass 06/09/2016   2009ish  . Suicidal ideation    & HOMICIDAL IDEATION --  06-16-2013   ADMITTED TO BEHAVIOR HEALTH    Patient Active Problem List   Diagnosis Date Noted  . PAD (peripheral artery disease) ([place]) 04/14/2018  . Healthcare-associated pneumonia 01/15/2018  . Acute on chronic respiratory failure ([place]) 01/15/2018  . Acute pulmonary edema ([place]) 12/23/2017  . Acute respiratory failure ([place]) 10/29/2017  . ESRD (end stage renal disease) on dialysis ([place]) 07/28/2017  . Protein-calorie malnutrition, severe 06/14/2017  . Encounter for dialysis [facility])   . Palliative care by specialist   . Goals of care, counseling/discussion   . Malnutrition of moderate degree 06/05/2017  . Secondary esophageal varices without bleeding ([place])   .  Stomach irritation   . Idiopathic esophageal varices without bleeding ([place])   . Alcoholic hepatitis with ascites 05/24/2017  . ESRD (end stage renal disease) ([place]) 04/28/2017  . Uremia 03/08/2017  . ESRD on hemodialysis ([place]) 03/03/2017  . Weakness 02/28/2017  . Hypocalcemia 02/22/2017  . Shortness of breath 11/26/2016  . COPD (chronic obstructive pulmonary disease) ([place]) 10/30/2016  . COPD exacerbation ([place]) 10/29/2016  . Anemia   . Heme positive stool   . Ulceration of intestine   . Benign neoplasm of transverse colon   . Acute gastrointestinal hemorrhage   . Esophageal candidiasis ([place])   . Angiodysplasia of intestinal tract   . Acute respiratory failure with hypoxia ([place]) 07/03/2016  . GI bleeding 06/24/2016  . Rectal bleeding 06/14/2016  . Anemia of chronic disease 06/01/2016  . MRSA carrier 06/01/2016  . Chronic renal failure 05/23/2016  . Ischemic heart disease 05/23/2016  . Angiodysplasia of small intestine   . Melena   . Small bowel bleed not requiring more than 4 units of blood in 24 hours, ICU, or surgery   . Anemia due to chronic blood loss   . Abdominal pain 05/05/2016  . Acute posthemorrhagic anemia 04/17/2016  . Gastrointestinal bleed 04/17/2016  . History of esophagogastroduodenoscopy (EGD) 04/17/2016  . Elevated troponin 04/17/2016  . Alcohol abuse 04/17/2016  . Upper GI bleed 01/19/2016  . Blood in stool   . Angiodysplasia of stomach and duodenum with hemorrhage   . Gastritis   . Reflux esophagitis   .  GI bleed 05/16/2015  . Acute GI bleeding   . Symptomatic anemia 04/30/2015  . HTN (hypertension) 04/06/2015  . GERD (gastroesophageal reflux disease) 04/06/2015  . HLD (hyperlipidemia) 04/06/2015  . Dyspnea 04/06/2015  . Cirrhosis of liver with ascites ([place]) 04/06/2015  . Ascites 04/06/2015  . GIB (gastrointestinal bleeding) 03/23/2015  . Homicidal ideation 06/19/2013  . Suicidal intent 06/19/2013  . Homicidal ideations 06/19/2013  . Hyperkalemia  06/16/2013  . Mandible fracture ([place]) 06/05/2013  . Fracture, mandible ([place]) 06/02/2013  . Coronary atherosclerosis of native coronary artery 06/02/2013  . ESRD on dialysis ([place]) 06/02/2013  . Mandible open fracture ([place]) 06/02/2013    Past Surgical History:  Procedure Laterality Date  . A/V FISTULAGRAM Right 06/06/2017   Procedure: A/V FISTULAGRAM;  Surgeon: Katha Cabal, MD;  Location: [place] CV LAB;  Service: Cardiovascular;  Laterality: Right;  . A/V SHUNT INTERVENTION N/A 06/06/2017   Procedure: A/V SHUNT INTERVENTION;  Surgeon: Katha Cabal, MD;  Location: [place] CV LAB;  Service: Cardiovascular;  Laterality: N/A;  . AGILE CAPSULE N/A 06/19/2016   Procedure: AGILE CAPSULE;  Surgeon: [name], MD;  Location: [facility] ENDOSCOPY;  Service: Endoscopy;  Laterality: N/A;  . COLONOSCOPY WITH PROPOFOL N/A 06/18/2016   Procedure: COLONOSCOPY WITH PROPOFOL;  Surgeon: [name], MD;  Location: [facility] ENDOSCOPY;  Service: Endoscopy;  Laterality: N/A;  . COLONOSCOPY WITH PROPOFOL N/A 08/12/2016   Procedure: COLONOSCOPY WITH PROPOFOL;  Surgeon: [name], MD;  Location: [facility] ENDOSCOPY;  Service: Endoscopy;  Laterality: N/A;  . COLONOSCOPY WITH PROPOFOL N/A 05/05/2017   Procedure: COLONOSCOPY WITH PROPOFOL;  Surgeon: Manya Silvas, MD;  Location: [facility] ENDOSCOPY;  Service: Endoscopy;  Laterality: N/A;  . CORONARY ANGIOPLASTY  ?   PT UNABLE TO TELL IF  BEFORE OR AFTER  CABG  . CORONARY ARTERY BYPASS GRAFT  2008  ([place])   3 VESSEL  . DIALYSIS FISTULA CREATION  LAST SURGERY  APPOX  2008  . ENTEROSCOPY N/A 05/10/2016   Procedure: ENTEROSCOPY;  Surgeon: Jerene Bears, MD;  Location: [place];  Service: Gastroenterology;  Laterality: N/A;  . ENTEROSCOPY N/A 08/12/2016   Procedure: ENTEROSCOPY;  Surgeon: [name], MD;  Location: [facility] ENDOSCOPY;  Service: Endoscopy;  Laterality: N/A;  . ENTEROSCOPY Left 06/03/2017   Procedure: ENTEROSCOPY;  Surgeon: Virgel Manifold, MD;  Location: [facility] ENDOSCOPY;  Service: Endoscopy;  Laterality: Left;  Procedure date will ultimately depend on when patient is medically optimized before the procedure, pending hemodialysis and blood transfusions etc. Will place on schedule and change depending on clinical status.   . ENTEROSCOPY N/A 06/05/2017   Procedure: ENTEROSCOPY;  Surgeon: Virgel Manifold, MD;  Location: [facility] ENDOSCOPY;  Service: Endoscopy;  Laterality: N/A;  . ENTEROSCOPY N/A 06/15/2017   Procedure: Push ENTEROSCOPY;  Surgeon: [name], MD;  Location: [facility] ENDOSCOPY;  Service: Endoscopy;  Laterality: N/A;  . ESOPHAGOGASTRODUODENOSCOPY N/A 05/07/2015   Procedure: ESOPHAGOGASTRODUODENOSCOPY (EGD);  Surgeon: Hulen Luster, MD;  Location: [facility] ENDOSCOPY;  Service: Endoscopy;  Laterality: N/A;  . ESOPHAGOGASTRODUODENOSCOPY (EGD) WITH PROPOFOL N/A 05/17/2015   Procedure: ESOPHAGOGASTRODUODENOSCOPY (EGD) WITH PROPOFOL;  Surgeon: [name], MD;  Location: [facility] ENDOSCOPY;  Service: Endoscopy;  Laterality: N/A;  . ESOPHAGOGASTRODUODENOSCOPY (EGD) WITH PROPOFOL N/A 01/20/2016   Procedure: ESOPHAGOGASTRODUODENOSCOPY (EGD) WITH PROPOFOL;  Surgeon: [name], MD;  Location: [facility] ENDOSCOPY;  Service: Endoscopy;  Laterality: N/A;  . ESOPHAGOGASTRODUODENOSCOPY (EGD) WITH PROPOFOL N/A 04/17/2016   Procedure: ESOPHAGOGASTRODUODENOSCOPY (EGD) WITH PROPOFOL;  Surgeon: Lin Landsman,  MD;  Location: [facility] ENDOSCOPY;  Service: Gastroenterology;  Laterality: N/A;  . ESOPHAGOGASTRODUODENOSCOPY (EGD) WITH PROPOFOL  05/09/2016   Procedure: ESOPHAGOGASTRODUODENOSCOPY (EGD) WITH PROPOFOL;  Surgeon: Jerene Bears, MD;  Location: [place];  Service: Endoscopy;;  . ESOPHAGOGASTRODUODENOSCOPY (EGD) WITH PROPOFOL N/A 06/16/2016   Procedure: ESOPHAGOGASTRODUODENOSCOPY (EGD) WITH PROPOFOL;  Surgeon: [name], MD;  Location: [facility] ENDOSCOPY;  Service: Endoscopy;  Laterality: N/A;  . ESOPHAGOGASTRODUODENOSCOPY (EGD) WITH PROPOFOL N/A 05/05/2017    Procedure: ESOPHAGOGASTRODUODENOSCOPY (EGD) WITH PROPOFOL;  Surgeon: Manya Silvas, MD;  Location: [facility] ENDOSCOPY;  Service: Endoscopy;  Laterality: N/A;  . ESOPHAGOGASTRODUODENOSCOPY (EGD) WITH PROPOFOL N/A 06/15/2017   Procedure: ESOPHAGOGASTRODUODENOSCOPY (EGD) WITH PROPOFOL;  Surgeon: [name], MD;  Location: [facility] ENDOSCOPY;  Service: Endoscopy;  Laterality: N/A;  . ESOPHAGOGASTRODUODENOSCOPY (EGD) WITH PROPOFOL N/A 04/27/2018   Procedure: ESOPHAGOGASTRODUODENOSCOPY (EGD) WITH PROPOFOL;  Surgeon: [name], MD;  Location: [facility] ENDOSCOPY;  Service: Endoscopy;  Laterality: N/A;  . GIVENS CAPSULE STUDY N/A 05/07/2016   Procedure: GIVENS CAPSULE STUDY;  Surgeon: Doran Stabler, MD;  Location: [place];  Service: Endoscopy;  Laterality: N/A;  . LOWER EXTREMITY ANGIOGRAPHY Left 04/19/2018   Procedure: LOWER EXTREMITY ANGIOGRAPHY;  Surgeon: Algernon Huxley, MD;  Location: [place] CV LAB;  Service: Cardiovascular;  Laterality: Left;  Marland Kitchen MANDIBULAR HARDWARE REMOVAL N/A 07/29/2013   Procedure: REMOVAL OF ARCH BARS;  Surgeon: Theodoro Kos, DO;  Location: [place];  Service: Plastics;  Laterality: N/A;  . ORIF MANDIBULAR FRACTURE N/A 06/05/2013   Procedure: REPAIR OF MANDIBULAR FRACTURE x 2 with maxillo-mandibular fixation ;  Surgeon: Theodoro Kos, DO;  Location: [place];  Service: Plastics;  Laterality: N/A;  . PARACENTESIS    . PERIPHERAL ARTERIAL STENT GRAFT Left     Prior to Admission medications   Medication Sig Start Date End Date Taking? Authorizing Provider  acetaminophen (TYLENOL) 325 MG tablet Take 2 tablets (650 mg total) by mouth every 6 (six) hours as needed for mild pain (or Fever >/= 101). 05/07/18   Gouru, Aruna, MD  alum & mag hydroxide-simeth (MAALOX/MYLANTA) 200-200-20 MG/5ML suspension Take 30 mLs by mouth every 4 (four) hours as needed for indigestion. 05/07/18   Nicholes Mango, MD  aspirin EC 81 MG tablet Take 1 tablet (81 mg total) by mouth daily. 04/20/18  04/20/19  Epifanio Lesches, MD  budesonide-formoterol (SYMBICORT) 160-4.5 MCG/ACT inhaler Inhale 2 puffs into the lungs daily. 05/07/18   Nicholes Mango, MD  calcium acetate (PHOSLO) 667 MG capsule Take 2,001 mg by mouth 3 (three) times daily.    [provider]  docusate sodium (COLACE) 100 MG capsule Take 1 capsule (100 mg total) by mouth 2 (two) times daily as needed for mild constipation. 05/07/18   Nicholes Mango, MD  folic acid (FOLVITE) 1 MG tablet Take 1 tablet (1 mg total) by mouth daily. 10/31/17   Fritzi Mandes, MD  gabapentin (NEURONTIN) 300 MG capsule Take 300 mg by mouth 3 (three) times daily.  08/16/16   [provider]  ipratropium-albuterol (DUONEB) 0.5-2.5 (3) MG/3ML SOLN Take 3 mLs by nebulization every 6 (six) hours as needed. 05/07/18   Nicholes Mango, MD  labetalol (NORMODYNE) 100 MG tablet Take 100 mg by mouth daily.    [provider]  multivitamin (RENA-VIT) TABS tablet Take 1 tablet by mouth at bedtime. 10/31/17   Fritzi Mandes, MD  Nutritional Supplements (FEEDING SUPPLEMENT, NEPRO CARB STEADY,) LIQD Take 237 mLs by mouth 2 (two) times daily between meals. 05/07/18  Nicholes Mango, MD  omeprazole (PRILOSEC) 40 MG capsule Take 1 capsule (40 mg total) by mouth 2 (two) times daily. 05/07/18   Gouru, Illene Silver, MD  oxyCODONE (OXY IR/ROXICODONE) 5 MG immediate release tablet Take 1 tablet (5 mg total) by mouth every 6 (six) hours as needed for moderate pain. 05/07/18   Gouru, Illene Silver, MD  sucralfate (CARAFATE) 1 GM/10ML suspension Take 10 mLs (1 g total) by mouth 4 (four) times daily -  with meals and at bedtime. 05/07/18   Gouru, Illene Silver, MD  tiotropium (SPIRIVA HANDIHALER) 18 MCG inhalation capsule Place 1 capsule (18 mcg total) into inhaler and inhale daily. 05/07/18 08/05/18  Nicholes Mango, MD  vitamin C (VITAMIN C) 250 MG tablet Take 1 tablet (250 mg total) by mouth 2 (two) times daily. 04/20/18   Epifanio Lesches, MD     Allergies Patient has no known allergies.  Family  History  Problem Relation Age of Onset  . Colon cancer Mother   . Cancer Father   . Cancer Sister   . Kidney disease Brother     Social History Social History   Tobacco Use  . Smoking status: Current Every Day Smoker    Packs/day: 0.15    Years: 40.00    Pack years: 6.00    Types: Cigarettes  . Smokeless tobacco: Never Used  Substance Use Topics  . Alcohol use: No    Comment: pt reports quitting after learning about cirrhosis  . Drug use: No    Frequency: 7.0 times per week    Types: Marijuana, Cocaine    Review of Systems  Constitutional: No fever/chills Eyes: No visual changes.   Cardiovascular: Denies chest pain. Respiratory: Denies shortness of breath. Gastrointestinal: No abdominal pain.      ____________________________________________   PHYSICAL EXAM:  VITAL SIGNS: ED Triage Vitals  Enc Vitals Group     BP 05/25/18 1259 120/79     Pulse Rate 05/25/18 1259 97     Resp 05/25/18 1259 16     Temp 05/25/18 1259 98.5 F (36.9 C)     Temp Source 05/25/18 1259 Oral     SpO2 05/25/18 1259 96 %     Weight 05/25/18 1236 72.8 kg (160 lb 7.9 oz)     Height 05/25/18 1236 1.905 m (6\' 3" )     Head Circumference --      Peak Flow --      Pain Score 05/25/18 1235 9     Pain Loc --      Pain Edu? --      Excl. in [place]? --     Constitutional: Alert and oriented. No acute distress. Pleasant and interactive  Nose: No congestion/rhinnorhea. Mouth/Throat: Mucous membranes are moist.    Cardiovascular: Normal rate, regular rhythm. Grossly normal heart sounds.  Good peripheral circulation. Respiratory: Normal respiratory effort.  No retractions. Lungs CTAB.   Musculoskeletal: No lower extremity tenderness nor edema.  Warm and well perfused  Skin:  Skin is warm, dry and intact. No rash noted. Psychiatric: Mood and affect are normal. Speech and behavior are normal.  ____________________________________________   LABS (all labs ordered are listed, but only  abnormal results are displayed)  Labs Reviewed - No data to display ____________________________________________  EKG  None ____________________________________________  RADIOLOGY  None ____________________________________________   PROCEDURES  Procedure(s) performed: No  Procedures   Critical Care performed: No ____________________________________________   INITIAL IMPRESSION / ASSESSMENT AND PLAN / ED COURSE  Pertinent labs & imaging results that were  available during my care of the patient were reviewed by me and considered in my medical decision making (see chart for details).  Patient well-appearing and in no acute distress, stable for routine dialysis, we will notify dialysis    ____________________________________________   FINAL CLINICAL IMPRESSION(S) / ED DIAGNOSES  Encounter for dialysis     Note:  This document was prepared using Dragon voice recognition software and may include unintentional dictation errors.    Lavonia Drafts, MD 05/25/18 [phone]

## 2018-05-25 NOTE — ED Triage Notes (Signed)
Says here for dialysis

## 2018-05-25 NOTE — Progress Notes (Signed)
Pre HD Tx   05/25/18 1330  Vital Signs  Temp 98.6 F (37 C)  Temp Source Oral  Pulse Rate 100  Pulse Rate Source Monitor  Resp 20  BP 125/80  BP Location Left Arm  BP Method Automatic  Patient Position (if appropriate) Sitting  Oxygen Therapy  SpO2 93 %  O2 Device Room Air  Pain Assessment  Pain Scale 0-10  Pain Score 10  Pain Type Other (Comment);Neuropathic pain;Chronic pain (necrotizing foot ulcer pain)  Pain Location Foot  Pain Orientation Left  Patients Stated Pain Goal 0  Pain Intervention(s) Medication (See eMAR)  Dialysis Weight  Weight 72.8 kg  Type of Weight Pre-Dialysis  Time-Out for Hemodialysis  What Procedure? Hemodialysis   Pt Identifiers(min of two) First/Last Name;MRN/Account#  Correct Site? Yes  Correct Side? Yes  Correct Procedure? Yes  Consents Verified? Yes  Rad Studies Available? N/A  Safety Precautions Reviewed? Yes  Engineer, civil (consulting) Number 3  Station Number 2  UF/Alarm Test Passed  Conductivity: Meter 14  Conductivity: Machine  13.8  pH 7.4  Reverse Osmosis main  Normal Saline Lot Number [id number]  Dialyzer Lot Number [id number]  Disposable Set Lot Number [id number]  Machine Temperature 98.6 F (37 C)  Musician and Audible Yes  Blood Lines Intact and Secured Yes  Pre Treatment Patient Checks  Vascular access used during treatment Catheter  Patient is receiving dialysis in a chair Yes  Hepatitis B Surface Antigen Results Negative  Date Hepatitis B Surface Antigen Drawn 06/16/17  Hepatitis B Surface Antibody  (>10)  Date Hepatitis B Surface Antibody Drawn 06/16/17  Hemodialysis Consent Verified Yes  Hemodialysis Standing Orders Initiated Yes  ECG (Telemetry) Monitor On Yes  Prime Ordered Normal Saline  Length of  DialysisTreatment -hour(s) 3.5 Hour(s)  Dialysis Treatment Comments Na 140  Dialyzer Elisio 17H NR  Dialysate 2K, 2.5 Ca  Dialysate Flow Ordered 800  Blood Flow Rate Ordered 400 mL/min  Ultrafiltration Goal 3  Liters  Pre Treatment Labs Renal panel;CBC  Dialysis Blood Pressure Support Ordered Normal Saline  Hemodialysis Catheter Right Internal jugular Double-lumen  No Placement Date or Time found.   Placed prior to admission: Yes  Orientation: Right  Access Location: Internal jugular  Hemodialysis Catheter Type: Double-lumen  Site Condition No complications  Blue Lumen Status Capped (Central line)  Red Lumen Status Capped (Central line)  Purple Lumen Status N/A  Dressing Type Biopatch  Dressing Status Clean;Dry;Intact  Interventions New dressing;Dressing changed  Drainage Description None

## 2018-05-25 NOTE — Progress Notes (Signed)
Post Tx Assessment 2800 mL net fluid removal    05/25/18 1700  Neurological  Level of Consciousness Alert  Orientation Level Oriented X4  Respiratory  Respiratory Pattern Regular  Bilateral Breath Sounds Diminished  Cardiac  Pulse Regular  Cardiac Rhythm ST  Ectopy Multifocal PVC's  Ectopy Frequency Frequent  Vascular  R Radial Pulse +2  L Radial Pulse +2  Edema Generalized  Generalized Edema +1  Integumentary  Integumentary (WDL) X  Skin Color Appropriate for ethnicity  Skin Condition Dry  Musculoskeletal  Musculoskeletal (WDL) X  Generalized Weakness Yes  Gastrointestinal  Bowel Sounds Assessment Active  Psychosocial  Psychosocial (WDL) WDL

## 2018-05-25 NOTE — ED Provider Notes (Signed)
Patient returns from dialysis.  He is feeling good.  He is not short of breath.  He has no fever or any other complaints.  His belly is somewhat larger than usual and he reports he feels he will will need a paracentesis soon.  We cannot do 1 today as that is after 5 on Friday.  He does not need 1 acutely.  He does not have any belly pain. Physical exam: Well-developed well-nourished male in no distress mouth is normal heart regular rate and rhythm no audible murmurs lungs are clear abdomen is soft nontender bowel sounds are positive there is some distention.  Extremities show no pain or edema. Assessment postdialysis Plan return probably on Monday for further dialysis and likely paracentesis.  Return sooner if there is any further problems.   Nena Polio, MD 05/25/18 [phone]

## 2018-05-25 NOTE — Discharge Instructions (Addendum)
Please return Monday for further dialysis and as needed.

## 2018-05-25 NOTE — Progress Notes (Signed)
Post HD Tx    05/25/18 1714  Vital Signs  Pulse Rate 100  Resp 15  BP 118/73  Oxygen Therapy  SpO2 95 %  Post-Hemodialysis Assessment  Rinseback Volume (mL) 250 mL  KECN 70.7 V  Dialyzer Clearance Lightly streaked  Duration of HD Treatment -hour(s) 3.5 hour(s)  Hemodialysis Intake (mL) 500 mL  UF Total -Machine (mL) 3300 mL  Net UF (mL) 2800 mL  Tolerated HD Treatment Yes  Hemodialysis Catheter Right Internal jugular Double-lumen  No Placement Date or Time found.   Placed prior to admission: Yes  Orientation: Right  Access Location: Internal jugular  Hemodialysis Catheter Type: Double-lumen  Site Condition No complications  Blue Lumen Status Capped (Central line)  Red Lumen Status Capped (Central line)  Post treatment catheter status Capped and Clamped

## 2018-05-25 NOTE — Progress Notes (Signed)
HD Tx End   05/25/18 1700  Hand-Off documentation  Report given to (Full Name) Charge ED RN  Report received from (Full Name) Trellis Paganini RN  Vital Signs  Temp 97.8 F (36.6 C)  Temp Source Oral  Pulse Rate 98  Pulse Rate Source Monitor  Resp 17  BP 116/71  BP Location Left Arm  BP Method Automatic  Patient Position (if appropriate) Sitting  Oxygen Therapy  SpO2 96 %  O2 Device Room Air  Pain Assessment  Pain Scale 0-10  Pain Score 4  Dialysis Weight  Weight 69.9 kg  Type of Weight Post-Dialysis  During Hemodialysis Assessment  Blood Flow Rate (mL/min) 200 mL/min  Arterial Pressure (mmHg) -190 mmHg  Venous Pressure (mmHg) 170 mmHg  Transmembrane Pressure (mmHg) 50 mmHg  Ultrafiltration Rate (mL/min) 850 mL/min  Dialysate Flow Rate (mL/min) 800 ml/min  Conductivity: Machine  14  HD Safety Checks Performed Yes  Dialysis Fluid Bolus Normal Saline  Bolus Amount (mL) 250 mL  Intra-Hemodialysis Comments Tx completed;Tolerated well (pt stable, tolerated tx well)  Hemodialysis Catheter Right Internal jugular Double-lumen  No Placement Date or Time found.   Placed prior to admission: Yes  Orientation: Right  Access Location: Internal jugular  Hemodialysis Catheter Type: Double-lumen  Site Condition No complications  Blue Lumen Status Flushed;Saline locked;Capped (Central line);Heparin locked  Red Lumen Status Flushed;Saline locked;Capped (Central line);Heparin locked  Catheter fill solution Heparin 1000 units/ml  Catheter fill volume (Arterial) 1.5 cc  Catheter fill volume (Venous) 1.5  Dressing Type Biopatch  Dressing Status Clean;Dry;Intact  Post treatment catheter status Capped and Clamped

## 2018-05-25 NOTE — Progress Notes (Signed)
Pre HD Assessment   05/25/18 1320  Neurological  Level of Consciousness Alert  Orientation Level Oriented X4  Respiratory  Respiratory Pattern Regular  Chest Assessment Chest expansion symmetrical  Bilateral Breath Sounds Diminished  Cardiac  Pulse Regular  Cardiac Rhythm ST  Ectopy Multifocal PVC's  Ectopy Frequency Frequent  Vascular  R Radial Pulse +2  L Radial Pulse +2  Edema Generalized  Generalized Edema +1  Integumentary  Integumentary (WDL) X  Skin Color Appropriate for ethnicity  Skin Condition Dry  Additional Integumentary Comments  (HD pt /cath exit site )  Musculoskeletal  Musculoskeletal (WDL) X  Generalized Weakness Yes  Gastrointestinal  Bowel Sounds Assessment Active  Psychosocial  Psychosocial (WDL) WDL

## 2018-05-25 NOTE — Progress Notes (Signed)
HD Tx Start   05/25/18 1334  Vital Signs  Pulse Rate 100  Resp 16  BP 125/80  Oxygen Therapy  SpO2 95 %  During Hemodialysis Assessment  Blood Flow Rate (mL/min) 400 mL/min  Arterial Pressure (mmHg) -180 mmHg  Venous Pressure (mmHg) 110 mmHg  Transmembrane Pressure (mmHg) 40 mmHg  Ultrafiltration Rate (mL/min) 850 mL/min  Dialysate Flow Rate (mL/min) 800 ml/min  Conductivity: Machine  14  HD Safety Checks Performed Yes  Dialysis Fluid Bolus Normal Saline  Bolus Amount (mL) 250 mL  Intra-Hemodialysis Comments Tx initiated  Education / Care Plan  Dialysis Education Provided Yes  Documented Education in Care Plan Yes  Fistula / Graft Right Upper arm Arteriovenous fistula  No Placement Date or Time found.   Orientation: Right  Access Location: Upper arm  Access Type: Arteriovenous fistula  Site Condition No complications  Fistula / Graft Assessment Absent   Drainage Description None  Hemodialysis Catheter Right Internal jugular Double-lumen  No Placement Date or Time found.   Placed prior to admission: Yes  Orientation: Right  Access Location: Internal jugular  Hemodialysis Catheter Type: Double-lumen  Site Condition No complications  Blue Lumen Status Infusing  Red Lumen Status Infusing  Purple Lumen Status N/A

## 2018-05-25 NOTE — Progress Notes (Signed)
[facility]  ROUNDING NOTE   Subjective:   Patient presents to the emergency room.  He has not had dialysis since his last treatment on 3/11.  He does not have an outpatient dialysis unit due to lack of transportation.    HEMODIALYSIS FLOWSHEET:  Blood Flow Rate (mL/min): 400 mL/min Arterial Pressure (mmHg): -180 mmHg Venous Pressure (mmHg): 110 mmHg Transmembrane Pressure (mmHg): 40 mmHg Ultrafiltration Rate (mL/min): 850 mL/min Dialysate Flow Rate (mL/min): 800 ml/min Conductivity: Machine : 14 Conductivity: Machine : 14 Dialysis Fluid Bolus: Normal Saline Bolus Amount (mL): 250 mL  Last lab results reviewed C/o foot pain  Objective:  Vital signs in last 24 hours:  Temp:  [98.5 F (36.9 C)] 98.5 F (36.9 C) (03/13 1259) Pulse Rate:  [97] 97 (03/13 1259) Resp:  [16] 16 (03/13 1334) BP: (120-125)/(79-80) 125/80 (03/13 1334) SpO2:  [96 %] 96 % (03/13 1259) Weight:  [72.8 kg] 72.8 kg (03/13 1236)  Weight change:  Filed Weights   05/25/18 1236  Weight: 72.8 kg    Intake/Output: No intake/output data recorded.   Intake/Output this shift:  No intake/output data recorded.  Physical Exam: General: NAD, sitting in chair  Head: Normocephalic, atraumatic. Moist oral mucosal membranes  Neck: Supple,   Lungs:   Normal breathing effort on room air  Heart: Regular rate and rhythm, sinus rhythm on telemetry  Abdomen:  Soft, nontender,   Extremities: 1+ peripheral edema,    Neurologic: Nonfocal, moving all four extremities  Skin: No lesions  Access: RIJ permcath    Basic Metabolic Panel: Recent Labs  Lab 05/19/18 1440  NA 139  K 5.4*  CL 91*  CO2 35*  GLUCOSE 156*  BUN 35*  CREATININE 8.78*  CALCIUM 8.1*  PHOS 6.1*    Liver Function Tests: Recent Labs  Lab 05/19/18 1440  ALBUMIN 2.3*   No results for input(s): LIPASE, AMYLASE in the last 168 hours. No results for input(s): AMMONIA in the last 168 hours.  CBC: Recent Labs  Lab  05/19/18 1440  WBC 6.7  HGB 6.7*  HCT 23.5*  MCV 90.4  PLT 344    Cardiac Enzymes: No results for input(s): CKTOTAL, CKMB, CKMBINDEX, TROPONINI in the last 168 hours.  BNP: Invalid input(s): POCBNP  CBG: No results for input(s): GLUCAP in the last 168 hours.  Microbiology: Results for orders placed or performed during the hospital encounter of 04/26/18  MRSA PCR Screening     Status: None   Collection Time: 04/26/18  1:09 PM  Result Value Ref Range Status   MRSA by PCR NEGATIVE NEGATIVE Final    Comment:        The GeneXpert MRSA Assay (FDA approved for NASAL specimens only), is one component of a comprehensive MRSA colonization surveillance program. It is not intended to diagnose MRSA infection nor to guide or monitor treatment for MRSA infections. Performed at [facility], [address] [place], [place] [address]     Coagulation Studies: No results for input(s): LABPROT, INR in the last 72 hours.  Urinalysis: No results for input(s): COLORURINE, LABSPEC, PHURINE, GLUCOSEU, HGBUR, BILIRUBINUR, KETONESUR, PROTEINUR, UROBILINOGEN, NITRITE, LEUKOCYTESUR in the last 72 hours.  Invalid input(s): APPERANCEUR    Imaging: No results found.   Medications:   . sodium chloride    . sodium chloride     . [START ON 05/26/2018] Chlorhexidine Gluconate Cloth  6 each Topical Q0600     Assessment/ Plan:  [name] is a 58 y.o.  black male withend stage renal disease on hemodialysis secondary to Alport's syndrome, end stage liver disease with ascites, hypertension, peripheral vascular disease, coronary artery disease, hyperlipidemia, gastrointestinal AVMs, pulmonary hypertension  [facility] ([facility]) RIJ permcath.210 min, currently no transportation to dialysis unit  1. Hyperkalemia - 2 K bath on hemodialysis  2. End Stage Renal Disease: on hemodialysis. Unable to get to his outpatient clinic. Unfortunately no other local clinic is  willing to accept patient at this time. Patient is now getting his dialysis at [facility].  Seen and examined on hemodialysis treatment. Tolerating treatment well.  3. Anemia of chronic kidney disease: with multiple GI bleeds.  - EPO with HD treatment.   4. Secondary Hyperparathyroidism: PTH 597 on 03/26/18.  Phosphorus 6.1   5. Ascites- Last paracentesis feb 14.  7.3 L fluid was drawn   LOS: 0 Antar Milks 3/13/20203:00 PM

## 2018-05-27 ENCOUNTER — Emergency Department: Payer: Medicare Other

## 2018-05-27 ENCOUNTER — Other Ambulatory Visit: Payer: Self-pay

## 2018-05-27 ENCOUNTER — Encounter: Payer: Self-pay | Admitting: Emergency Medicine

## 2018-05-27 ENCOUNTER — Emergency Department
Admission: EM | Admit: 2018-05-27 | Discharge: 2018-05-27 | Disposition: A | Discharge status: Home / Self Care | Payer: Medicare Other | Attending: Student in an Organized Health Care Education/Training Program | Admitting: Student in an Organized Health Care Education/Training Program

## 2018-05-27 DIAGNOSIS — Z951 Presence of aortocoronary bypass graft: Secondary | ICD-10-CM | POA: Diagnosis not present

## 2018-05-27 DIAGNOSIS — R140 Abdominal distension (gaseous): Secondary | ICD-10-CM | POA: Diagnosis not present

## 2018-05-27 DIAGNOSIS — J9601 Acute respiratory failure with hypoxia: Secondary | ICD-10-CM | POA: Insufficient documentation

## 2018-05-27 DIAGNOSIS — N186 End stage renal disease: Secondary | ICD-10-CM | POA: Insufficient documentation

## 2018-05-27 DIAGNOSIS — R5381 Other malaise: Secondary | ICD-10-CM | POA: Diagnosis not present

## 2018-05-27 DIAGNOSIS — R0902 Hypoxemia: Secondary | ICD-10-CM | POA: Diagnosis not present

## 2018-05-27 DIAGNOSIS — I2510 Atherosclerotic heart disease of native coronary artery without angina pectoris: Secondary | ICD-10-CM | POA: Insufficient documentation

## 2018-05-27 DIAGNOSIS — Z79899 Other long term (current) drug therapy: Secondary | ICD-10-CM | POA: Insufficient documentation

## 2018-05-27 DIAGNOSIS — R0602 Shortness of breath: Secondary | ICD-10-CM | POA: Diagnosis not present

## 2018-05-27 DIAGNOSIS — I120 Hypertensive chronic kidney disease with stage 5 chronic kidney disease or end stage renal disease: Secondary | ICD-10-CM | POA: Insufficient documentation

## 2018-05-27 DIAGNOSIS — Z992 Dependence on renal dialysis: Secondary | ICD-10-CM | POA: Insufficient documentation

## 2018-05-27 DIAGNOSIS — Z7982 Long term (current) use of aspirin: Secondary | ICD-10-CM | POA: Diagnosis not present

## 2018-05-27 DIAGNOSIS — F17210 Nicotine dependence, cigarettes, uncomplicated: Secondary | ICD-10-CM | POA: Diagnosis not present

## 2018-05-27 DIAGNOSIS — R188 Other ascites: Secondary | ICD-10-CM | POA: Diagnosis not present

## 2018-05-27 LAB — BASIC METABOLIC PANEL
Anion gap: 14 (ref 5–15)
BUN: 25 mg/dL — ABNORMAL HIGH (ref 6–20)
CO2: 37 mmol/L — ABNORMAL HIGH (ref 22–32)
CREATININE: 6.06 mg/dL — AB (ref 0.61–1.24)
Calcium: 8.3 mg/dL — ABNORMAL LOW (ref 8.9–10.3)
Chloride: 89 mmol/L — ABNORMAL LOW (ref 98–111)
GFR calc Af Amer: 11 mL/min — ABNORMAL LOW (ref >60)
GFR calc non Af Amer: 9 mL/min — ABNORMAL LOW (ref >60)
Glucose, Bld: 86 mg/dL (ref 70–99)
Potassium: 5.1 mmol/L (ref 3.5–5.1)
Sodium: 140 mmol/L (ref 135–145)

## 2018-05-27 LAB — HEPATIC FUNCTION PANEL
ALT: 7 U/L (ref 0–44)
AST: 19 U/L (ref 15–41)
Albumin: 2.7 g/dL — ABNORMAL LOW (ref 3.5–5.0)
Alkaline Phosphatase: 113 U/L (ref 38–126)
Bilirubin, Direct: 0.1 mg/dL (ref 0.0–0.2)
Indirect Bilirubin: 1.1 mg/dL — ABNORMAL HIGH (ref 0.3–0.9)
Total Bilirubin: 1.2 mg/dL (ref 0.3–1.2)
Total Protein: 8 g/dL (ref 6.5–8.1)

## 2018-05-27 LAB — CBC WITH DIFFERENTIAL/PLATELET
Abs Immature Granulocytes: 0.03 10*3/uL (ref 0.00–0.07)
Basophils Absolute: 0.1 10*3/uL (ref 0.0–0.1)
Basophils Relative: 1 %
Eosinophils Absolute: 0.1 10*3/uL (ref 0.0–0.5)
Eosinophils Relative: 2 %
HCT: 29 % — ABNORMAL LOW (ref 39.0–52.0)
Hemoglobin: 8.1 g/dL — ABNORMAL LOW (ref 13.0–17.0)
Immature Granulocytes: 0 %
Lymphocytes Relative: 11 %
Lymphs Abs: 0.8 10*3/uL (ref 0.7–4.0)
MCH: 24.8 pg — ABNORMAL LOW (ref 26.0–34.0)
MCHC: 27.9 g/dL — ABNORMAL LOW (ref 30.0–36.0)
MCV: 88.7 fL (ref 80.0–100.0)
Monocytes Absolute: 0.8 10*3/uL (ref 0.1–1.0)
Monocytes Relative: 11 %
Neutro Abs: 5.4 10*3/uL (ref 1.7–7.7)
Neutrophils Relative %: 75 %
PLATELETS: 339 10*3/uL (ref 150–400)
RBC: 3.27 MIL/uL — AB (ref 4.22–5.81)
RDW: 17.8 % — ABNORMAL HIGH (ref 11.5–15.5)
WBC: 7.2 10*3/uL (ref 4.0–10.5)
nRBC: 0 % (ref 0.0–0.2)

## 2018-05-27 LAB — BODY FLUID CELL COUNT WITH DIFFERENTIAL
Eos, Fluid: 1 %
Lymphs, Fluid: 19 %
Monocyte-Macrophage-Serous Fluid: 80 %
Neutrophil Count, Fluid: 0 %
Total Nucleated Cell Count, Fluid: 385 cu mm

## 2018-05-27 MED ORDER — ALBUMIN HUMAN 25 % IV SOLN
12.5000 g | Freq: Once | INTRAVENOUS | Status: AC
  Administered 2018-05-27: 12.5 g via INTRAVENOUS
  Filled 2018-05-27 (×2): qty 50

## 2018-05-27 MED ORDER — DIPHENHYDRAMINE HCL 25 MG PO CAPS
50.0000 mg | ORAL_CAPSULE | Freq: Once | ORAL | Status: AC
  Administered 2018-05-27: 50 mg via ORAL
  Filled 2018-05-27: qty 2

## 2018-05-27 NOTE — ED Notes (Signed)
Gave food tray with soda.

## 2018-05-27 NOTE — ED Provider Notes (Addendum)
[facility] Emergency Department Provider Note    First MD Initiated Contact with Patient 05/27/18 1111     (approximate)  I have reviewed the triage vital signs and the nursing notes.   HISTORY  Chief Complaint Shortness of Breath    HPI Stanley [name] is a 58 y.o. male since past medical history as listed below presents with recurrent abdominal distention and shortness of breath associated with a distended abdomen.  Patient has required paracenteses in the past.  Has been compliant with his dialysis last being dialyzed on Friday.  Denies any fevers.   Does appear very short of breath.   Past Medical History:  Diagnosis Date   Alcohol abuse    CHF (congestive heart failure) ([place])    Cirrhosis ([place])    Coronary artery disease 2009   Drug abuse ([place])    End stage renal disease on dialysis [facility]) NEPHROLOGIST-   [facility]  IN [place]   HEMODIALYSIS --   TUES/  THURS/  SAT   Gastrointestinal bleed 06/13/2017   From chart...hx of multiple GI bleeds   GERD (gastroesophageal reflux disease)    Hyperlipidemia    Hypertension    PAD (peripheral artery disease) (HCC)    Renal insufficiency    Per pt, 32 oz fluid restriction per day   S/P triple vessel bypass 06/09/2016   2009ish   Suicidal ideation    & HOMICIDAL IDEATION --  06-16-2013   ADMITTED TO BEHAVIOR HEALTH   Family History  Problem Relation Age of Onset   Colon cancer Mother    Cancer Father    Cancer Sister    Kidney disease Brother    Past Surgical History:  Procedure Laterality Date   A/V FISTULAGRAM Right 06/06/2017   Procedure: A/V FISTULAGRAM;  Surgeon: Katha Cabal, MD;  Location: [place] CV LAB;  Service: Cardiovascular;  Laterality: Right;   A/V SHUNT INTERVENTION N/A 06/06/2017   Procedure: A/V SHUNT INTERVENTION;  Surgeon: Katha Cabal, MD;  Location: [place] CV LAB;  Service: Cardiovascular;  Laterality: N/A;   AGILE CAPSULE  N/A 06/19/2016   Procedure: AGILE CAPSULE;  Surgeon: [name], MD;  Location: [facility] ENDOSCOPY;  Service: Endoscopy;  Laterality: N/A;   COLONOSCOPY WITH PROPOFOL N/A 06/18/2016   Procedure: COLONOSCOPY WITH PROPOFOL;  Surgeon: [name], MD;  Location: [facility] ENDOSCOPY;  Service: Endoscopy;  Laterality: N/A;   COLONOSCOPY WITH PROPOFOL N/A 08/12/2016   Procedure: COLONOSCOPY WITH PROPOFOL;  Surgeon: [name], MD;  Location: [facility] ENDOSCOPY;  Service: Endoscopy;  Laterality: N/A;   COLONOSCOPY WITH PROPOFOL N/A 05/05/2017   Procedure: COLONOSCOPY WITH PROPOFOL;  Surgeon: Manya Silvas, MD;  Location: [facility] ENDOSCOPY;  Service: Endoscopy;  Laterality: N/A;   CORONARY ANGIOPLASTY  ?   PT UNABLE TO TELL IF  BEFORE OR AFTER  CABG   CORONARY ARTERY BYPASS GRAFT  2008  [facility] [place])   3 VESSEL   DIALYSIS FISTULA CREATION  LAST SURGERY  APPOX  2008   ENTEROSCOPY N/A 05/10/2016   Procedure: ENTEROSCOPY;  Surgeon: Jerene Bears, MD;  Location: [facility] ENDOSCOPY;  Service: Gastroenterology;  Laterality: N/A;   ENTEROSCOPY N/A 08/12/2016   Procedure: ENTEROSCOPY;  Surgeon: [name], MD;  Location: [facility] ENDOSCOPY;  Service: Endoscopy;  Laterality: N/A;   ENTEROSCOPY Left 06/03/2017   Procedure: ENTEROSCOPY;  Surgeon: Virgel Manifold, MD;  Location: [facility] ENDOSCOPY;  Service: Endoscopy;  Laterality: Left;  Procedure date will ultimately depend on when patient  is medically optimized before the procedure, pending hemodialysis and blood transfusions etc. Will place on schedule and change depending on clinical status.    ENTEROSCOPY N/A 06/05/2017   Procedure: ENTEROSCOPY;  Surgeon: Virgel Manifold, MD;  Location: [facility] ENDOSCOPY;  Service: Endoscopy;  Laterality: N/A;   ENTEROSCOPY N/A 06/15/2017   Procedure: Push ENTEROSCOPY;  Surgeon: [name], MD;  Location: [facility] ENDOSCOPY;  Service: Endoscopy;  Laterality: N/A;   ESOPHAGOGASTRODUODENOSCOPY N/A 05/07/2015   Procedure:  ESOPHAGOGASTRODUODENOSCOPY (EGD);  Surgeon: Hulen Luster, MD;  Location: [facility] ENDOSCOPY;  Service: Endoscopy;  Laterality: N/A;   ESOPHAGOGASTRODUODENOSCOPY (EGD) WITH PROPOFOL N/A 05/17/2015   Procedure: ESOPHAGOGASTRODUODENOSCOPY (EGD) WITH PROPOFOL;  Surgeon: [name], MD;  Location: [facility] ENDOSCOPY;  Service: Endoscopy;  Laterality: N/A;   ESOPHAGOGASTRODUODENOSCOPY (EGD) WITH PROPOFOL N/A 01/20/2016   Procedure: ESOPHAGOGASTRODUODENOSCOPY (EGD) WITH PROPOFOL;  Surgeon: [name], MD;  Location: [facility] ENDOSCOPY;  Service: Endoscopy;  Laterality: N/A;   ESOPHAGOGASTRODUODENOSCOPY (EGD) WITH PROPOFOL N/A 04/17/2016   Procedure: ESOPHAGOGASTRODUODENOSCOPY (EGD) WITH PROPOFOL;  Surgeon: Lin Landsman, MD;  Location: [facility] ENDOSCOPY;  Service: Gastroenterology;  Laterality: N/A;   ESOPHAGOGASTRODUODENOSCOPY (EGD) WITH PROPOFOL  05/09/2016   Procedure: ESOPHAGOGASTRODUODENOSCOPY (EGD) WITH PROPOFOL;  Surgeon: Jerene Bears, MD;  Location: [place];  Service: Endoscopy;;   ESOPHAGOGASTRODUODENOSCOPY (EGD) WITH PROPOFOL N/A 06/16/2016   Procedure: ESOPHAGOGASTRODUODENOSCOPY (EGD) WITH PROPOFOL;  Surgeon: [name], MD;  Location: [facility] ENDOSCOPY;  Service: Endoscopy;  Laterality: N/A;   ESOPHAGOGASTRODUODENOSCOPY (EGD) WITH PROPOFOL N/A 05/05/2017   Procedure: ESOPHAGOGASTRODUODENOSCOPY (EGD) WITH PROPOFOL;  Surgeon: Manya Silvas, MD;  Location: [facility] ENDOSCOPY;  Service: Endoscopy;  Laterality: N/A;   ESOPHAGOGASTRODUODENOSCOPY (EGD) WITH PROPOFOL N/A 06/15/2017   Procedure: ESOPHAGOGASTRODUODENOSCOPY (EGD) WITH PROPOFOL;  Surgeon: [name], MD;  Location: [facility] ENDOSCOPY;  Service: Endoscopy;  Laterality: N/A;   ESOPHAGOGASTRODUODENOSCOPY (EGD) WITH PROPOFOL N/A 04/27/2018   Procedure: ESOPHAGOGASTRODUODENOSCOPY (EGD) WITH PROPOFOL;  Surgeon: [name], MD;  Location: [facility] ENDOSCOPY;  Service: Endoscopy;  Laterality: N/A;   GIVENS CAPSULE STUDY N/A 05/07/2016   Procedure: GIVENS CAPSULE  STUDY;  Surgeon: Doran Stabler, MD;  Location: [place];  Service: Endoscopy;  Laterality: N/A;   LOWER EXTREMITY ANGIOGRAPHY Left 04/19/2018   Procedure: LOWER EXTREMITY ANGIOGRAPHY;  Surgeon: Algernon Huxley, MD;  Location: [place] CV LAB;  Service: Cardiovascular;  Laterality: Left;   MANDIBULAR HARDWARE REMOVAL N/A 07/29/2013   Procedure: REMOVAL OF ARCH BARS;  Surgeon: Theodoro Kos, DO;  Location: [place];  Service: Plastics;  Laterality: N/A;   ORIF MANDIBULAR FRACTURE N/A 06/05/2013   Procedure: REPAIR OF MANDIBULAR FRACTURE x 2 with maxillo-mandibular fixation ;  Surgeon: Theodoro Kos, DO;  Location: [place];  Service: Plastics;  Laterality: N/A;   PARACENTESIS     PERIPHERAL ARTERIAL STENT GRAFT Left    Patient Active Problem List   Diagnosis Date Noted   PAD (peripheral artery disease) ([place]) 04/14/2018   Healthcare-associated pneumonia 01/15/2018   Acute on chronic respiratory failure ([place]) 01/15/2018   Acute pulmonary edema ([place]) 12/23/2017   Acute respiratory failure ([place]) 10/29/2017   ESRD (end stage renal disease) on dialysis ([place]) 07/28/2017   Protein-calorie malnutrition, severe 06/14/2017   Encounter for dialysis [facility])    Palliative care by specialist    Goals of care, counseling/discussion    Malnutrition of moderate degree 06/05/2017   Secondary esophageal varices without bleeding ([place])    Stomach irritation    Idiopathic esophageal varices without bleeding (HCC)    Alcoholic hepatitis  with ascites 05/24/2017   ESRD (end stage renal disease) ([place]) 04/28/2017   Uremia 03/08/2017   ESRD on hemodialysis ([place]) 03/03/2017   Weakness 02/28/2017   Hypocalcemia 02/22/2017   Shortness of breath 11/26/2016   COPD (chronic obstructive pulmonary disease) ([place]) 10/30/2016   COPD exacerbation ([place]) 10/29/2016   Anemia    Heme positive stool    Ulceration of intestine    Benign neoplasm of transverse colon    Acute  gastrointestinal hemorrhage    Esophageal candidiasis (HCC)    Angiodysplasia of intestinal tract    Acute respiratory failure with hypoxia (HCC) 07/03/2016   GI bleeding 06/24/2016   Rectal bleeding 06/14/2016   Anemia of chronic disease 06/01/2016   MRSA carrier 06/01/2016   Chronic renal failure 05/23/2016   Ischemic heart disease 05/23/2016   Angiodysplasia of small intestine    Melena    Small bowel bleed not requiring more than 4 units of blood in 24 hours, ICU, or surgery    Anemia due to chronic blood loss    Abdominal pain 05/05/2016   Acute posthemorrhagic anemia 04/17/2016   Gastrointestinal bleed 04/17/2016   History of esophagogastroduodenoscopy (EGD) 04/17/2016   Elevated troponin 04/17/2016   Alcohol abuse 04/17/2016   Upper GI bleed 01/19/2016   Blood in stool    Angiodysplasia of stomach and duodenum with hemorrhage    Gastritis    Reflux esophagitis    GI bleed 05/16/2015   Acute GI bleeding    Symptomatic anemia 04/30/2015   HTN (hypertension) 04/06/2015   GERD (gastroesophageal reflux disease) 04/06/2015   HLD (hyperlipidemia) 04/06/2015   Dyspnea 04/06/2015   Cirrhosis of liver with ascites ([place]) 04/06/2015   Ascites 04/06/2015   GIB (gastrointestinal bleeding) 03/23/2015   Homicidal ideation 06/19/2013   Suicidal intent 06/19/2013   Homicidal ideations 06/19/2013   Hyperkalemia 06/16/2013   Mandible fracture ([place]) 06/05/2013   Fracture, mandible ([place]) 06/02/2013   Coronary atherosclerosis of native coronary artery 06/02/2013   ESRD on dialysis [facility]) 06/02/2013   Mandible open fracture ([place]) 06/02/2013      Prior to Admission medications   Medication Sig Start Date End Date Taking? Authorizing Provider  acetaminophen (TYLENOL) 325 MG tablet Take 2 tablets (650 mg total) by mouth every 6 (six) hours as needed for mild pain (or Fever >/= 101). 05/07/18   Gouru, Aruna, MD  alum & mag hydroxide-simeth  (MAALOX/MYLANTA) 200-200-20 MG/5ML suspension Take 30 mLs by mouth every 4 (four) hours as needed for indigestion. 05/07/18   Nicholes Mango, MD  aspirin EC 81 MG tablet Take 1 tablet (81 mg total) by mouth daily. 04/20/18 04/20/19  Epifanio Lesches, MD  budesonide-formoterol (SYMBICORT) 160-4.5 MCG/ACT inhaler Inhale 2 puffs into the lungs daily. 05/07/18   Nicholes Mango, MD  calcium acetate (PHOSLO) 667 MG capsule Take 2,001 mg by mouth 3 (three) times daily.    [provider]  docusate sodium (COLACE) 100 MG capsule Take 1 capsule (100 mg total) by mouth 2 (two) times daily as needed for mild constipation. 05/07/18   Nicholes Mango, MD  folic acid (FOLVITE) 1 MG tablet Take 1 tablet (1 mg total) by mouth daily. 10/31/17   Fritzi Mandes, MD  gabapentin (NEURONTIN) 300 MG capsule Take 300 mg by mouth 3 (three) times daily.  08/16/16   [provider]  ipratropium-albuterol (DUONEB) 0.5-2.5 (3) MG/3ML SOLN Take 3 mLs by nebulization every 6 (six) hours as needed. 05/07/18   Nicholes Mango, MD  labetalol (NORMODYNE) 100 MG  tablet Take 100 mg by mouth daily.    [provider]  multivitamin (RENA-VIT) TABS tablet Take 1 tablet by mouth at bedtime. 10/31/17   Fritzi Mandes, MD  Nutritional Supplements (FEEDING SUPPLEMENT, NEPRO CARB STEADY,) LIQD Take 237 mLs by mouth 2 (two) times daily between meals. 05/07/18   Nicholes Mango, MD  omeprazole (PRILOSEC) 40 MG capsule Take 1 capsule (40 mg total) by mouth 2 (two) times daily. 05/07/18   Gouru, Illene Silver, MD  oxyCODONE (OXY IR/ROXICODONE) 5 MG immediate release tablet Take 1 tablet (5 mg total) by mouth every 6 (six) hours as needed for moderate pain. 05/07/18   Gouru, Illene Silver, MD  sucralfate (CARAFATE) 1 GM/10ML suspension Take 10 mLs (1 g total) by mouth 4 (four) times daily -  with meals and at bedtime. 05/07/18   Gouru, Illene Silver, MD  tiotropium (SPIRIVA HANDIHALER) 18 MCG inhalation capsule Place 1 capsule (18 mcg total) into inhaler and inhale daily.  05/07/18 08/05/18  Nicholes Mango, MD  vitamin C (VITAMIN C) 250 MG tablet Take 1 tablet (250 mg total) by mouth 2 (two) times daily. 04/20/18   Epifanio Lesches, MD    Allergies Patient has no known allergies.    Social History Social History   Tobacco Use   Smoking status: Current Every Day Smoker    Packs/day: 0.15    Years: 40.00    Pack years: 6.00    Types: Cigarettes   Smokeless tobacco: Never Used  Substance Use Topics   Alcohol use: No    Comment: pt reports quitting after learning about cirrhosis   Drug use: No    Frequency: 7.0 times per week    Types: Marijuana, Cocaine    Review of Systems Patient denies headaches, rhinorrhea, blurry vision, numbness, shortness of breath, chest pain, edema, cough, abdominal pain, nausea, vomiting, diarrhea, dysuria, fevers, rashes or hallucinations unless otherwise stated above in HPI. ____________________________________________   PHYSICAL EXAM:  VITAL SIGNS: Vitals:   05/27/18 1300 05/27/18 1600  BP: 116/82 122/85  Pulse: 97 92  Resp: 20 (!) 22  Temp:    SpO2: 96% 95%    Constitutional: Alert and oriented.  Eyes: Conjunctivae are normal.  Head: Atraumatic. Nose: No congestion/rhinnorhea. Mouth/Throat: Mucous membranes are moist.   Neck: No stridor. Painless ROM.  Cardiovascular: Normal rate, regular rhythm. Grossly normal heart sounds.  Good peripheral circulation. Respiratory: mild respiratory distress.  bibasilar crackles Gastrointestinal: Soft and nontender. +++ distention with fluid wave. No abdominal bruits. No CVA tenderness. Genitourinary:  Musculoskeletal: No lower extremity tenderness nor edema.  No joint effusions. Neurologic:  Normal speech and language. No gross focal neurologic deficits are appreciated. No facial droop Skin:  Skin is warm, dry and intact. No rash noted. Psychiatric: Mood and affect are normal. Speech and behavior are normal.  ____________________________________________    LABS (all labs ordered are listed, but only abnormal results are displayed)  Results for orders placed or performed during the hospital encounter of 05/27/18 (from the past 24 hour(s))  Basic metabolic panel     Status: Abnormal   Collection Time: 05/27/18 11:47 AM  Result Value Ref Range   Sodium 140 135 - 145 mmol/L   Potassium 5.1 3.5 - 5.1 mmol/L   Chloride 89 (L) 98 - 111 mmol/L   CO2 37 (H) 22 - 32 mmol/L   Glucose, Bld 86 70 - 99 mg/dL   BUN 25 (H) 6 - 20 mg/dL   Creatinine, Ser 6.06 (H) 0.61 - 1.24 mg/dL  Calcium 8.3 (L) 8.9 - 10.3 mg/dL   GFR calc non Af Amer 9 (L) >60 mL/min   GFR calc Af Amer 11 (L) >60 mL/min   Anion gap 14 5 - 15  Hepatic function panel     Status: Abnormal   Collection Time: 05/27/18 11:47 AM  Result Value Ref Range   Total Protein 8.0 6.5 - 8.1 g/dL   Albumin 2.7 (L) 3.5 - 5.0 g/dL   AST 19 15 - 41 U/L   ALT 7 0 - 44 U/L   Alkaline Phosphatase 113 38 - 126 U/L   Total Bilirubin 1.2 0.3 - 1.2 mg/dL   Bilirubin, Direct 0.1 0.0 - 0.2 mg/dL   Indirect Bilirubin 1.1 (H) 0.3 - 0.9 mg/dL  CBC with Differential/Platelet     Status: Abnormal   Collection Time: 05/27/18 11:47 AM  Result Value Ref Range   WBC 7.2 4.0 - 10.5 K/uL   RBC 3.27 (L) 4.22 - 5.81 MIL/uL   Hemoglobin 8.1 (L) 13.0 - 17.0 g/dL   HCT 29.0 (L) 39.0 - 52.0 %   MCV 88.7 80.0 - 100.0 fL   MCH 24.8 (L) 26.0 - 34.0 pg   MCHC 27.9 (L) 30.0 - 36.0 g/dL   RDW 17.8 (H) 11.5 - 15.5 %   Platelets 339 150 - 400 K/uL   nRBC 0.0 0.0 - 0.2 %   Neutrophils Relative % 75 %   Neutro Abs 5.4 1.7 - 7.7 K/uL   Lymphocytes Relative 11 %   Lymphs Abs 0.8 0.7 - 4.0 K/uL   Monocytes Relative 11 %   Monocytes Absolute 0.8 0.1 - 1.0 K/uL   Eosinophils Relative 2 %   Eosinophils Absolute 0.1 0.0 - 0.5 K/uL   Basophils Relative 1 %   Basophils Absolute 0.1 0.0 - 0.1 K/uL   Immature Granulocytes 0 %   Abs Immature Granulocytes 0.03 0.00 - 0.07 K/uL  Body fluid cell count with differential      Status: Abnormal   Collection Time: 05/27/18 11:49 AM  Result Value Ref Range   Fluid Type-FCT Peritoneal    Color, Fluid YELLOW (A) YELLOW   Appearance, Fluid HAZY (A) CLEAR   WBC, Fluid 385 cu mm   Neutrophil Count, Fluid 0 %   Lymphs, Fluid 19 %   Monocyte-Macrophage-Serous Fluid 80 %   Eos, Fluid 1 %   ____________________________________________ ________________________________  RADIOLOGY  I personally reviewed all radiographic images ordered to evaluate for the above acute complaints and reviewed radiology reports and findings.  These findings were personally discussed with the patient.  Please see medical record for radiology report.  ____________________________________________   PROCEDURES  Procedure(s) performed:  .Paracentesis Date/Time: 05/27/2018 12:08 PM Performed by: Merlyn Lot, MD Authorized by: Merlyn Lot, MD   Consent:    Consent obtained:  Verbal   Consent given by:  Patient   Risks discussed:  Bleeding, bowel perforation, infection and pain   Alternatives discussed:  Delayed treatment and alternative treatment Pre-procedure details:    Procedure purpose:  Diagnostic   Preparation: Patient was prepped and draped in usual sterile fashion   Anesthesia (see MAR for exact dosages):    Anesthesia method:  Local infiltration   Local anesthetic:  Lidocaine 1% w/o epi Procedure details:    Needle gauge:  18   Ultrasound guidance: yes     Puncture site:  R lower quadrant   Fluid removed amount:  2L   Fluid appearance:  Clear   Dressing:  Adhesive bandage Post-procedure details:    Patient tolerance of procedure:  Tolerated well, no immediate complications .Critical Care Performed by: Merlyn Lot, MD Authorized by: Merlyn Lot, MD   Critical care provider statement:    Critical care time (minutes):  10   Critical care time was exclusive of:  Separately billable procedures and treating other patients   Critical care was  necessary to treat or prevent imminent or life-threatening deterioration of the following conditions:  Respiratory failure and renal failure   Critical care was time spent personally by me on the following activities:  Development of treatment plan with patient or surrogate, discussions with consultants, evaluation of patient's response to treatment, examination of patient, obtaining history from patient or surrogate, ordering and performing treatments and interventions, ordering and review of laboratory studies, ordering and review of radiographic studies, pulse oximetry, re-evaluation of patient's condition and review of old charts .Paracentesis Date/Time: 05/27/2018 5:52 PM Performed by: Merlyn Lot, MD Authorized by: Merlyn Lot, MD   Consent:    Consent obtained:  Verbal   Consent given by:  Patient   Risks discussed:  Bleeding, bowel perforation, infection and pain   Alternatives discussed:  Delayed treatment, alternative treatment, observation and referral Pre-procedure details:    Procedure purpose:  Therapeutic Anesthesia (see MAR for exact dosages):    Anesthesia method:  Local infiltration   Local anesthetic:  Lidocaine 1% w/o epi Procedure details:    Needle gauge:  18   Ultrasound guidance: yes     Puncture site:  L lower quadrant   Fluid removed amount:  2.5L   Fluid appearance:  Serous   Dressing:  Adhesive bandage Post-procedure details:    Patient tolerance of procedure:  Tolerated well, no immediate complications      Critical Care performed: yes ____________________________________________   INITIAL IMPRESSION / ASSESSMENT AND PLAN / ED COURSE  Pertinent labs & imaging results that were available during my care of the patient were reviewed by me and considered in my medical decision making (see chart for details).   DDX: ascites, effusion, chf, hyper k, volume overload  Mischa Pollard is a 58 y.o. who presents to the ED with symptoms as  described above with no hypoxia and tachypnea.  Chest x-ray without any evidence of edema or consolidation.  Seems more consistent with probable compressive atelectasis secondary to significantly distended abdomen.  I have consulted interventional radiology however they are not available.  Due to his discomfort and respiratory distress will perform bedside paracentesis.  Doubt infectious process.  Clinical Course as of May 27 1750  Sun May 27, 2018  1200 2 L of clear ascitic fluid removed with improvement in symptoms.  Does not appear purulent.  Will send off for cell count.   [PR]  1236 Symptoms seem to be improving after paracentesis.  No longer any respiratory distress.   [PR]  1238 No hyperkalemia.   [PR]  0762 Satting well on room air now.   [PR]  1348 Cell count without evidence of SBP.  Patient remains hemodynamically stable and appropriate for discharge home.   [PR]  1502 She started demanding additional paracentesis.   [PR]    Clinical Course User Index [PR] Merlyn Lot, MD   Patient feels much improved.  Is ambulating without any hypoxia.  He stable and appropriate for outpatient follow-up.  As part of my medical decision making, I reviewed the following data within the [facility] notes reviewed and incorporated, Labs reviewed,  notes from prior ED visits and [place] Controlled Substance Database   ____________________________________________   FINAL CLINICAL IMPRESSION(S) / ED DIAGNOSES  Final diagnoses:  Ascites  SOB (shortness of breath)  Acute respiratory failure with hypoxia ([place])      NEW MEDICATIONS STARTED DURING THIS VISIT:  Discharge Medication List as of 05/27/2018  1:49 PM       Note:  This document was prepared using Dragon voice recognition software and may include unintentional dictation errors.    Merlyn Lot, MD 05/27/18 1351    Merlyn Lot, MD 05/27/18 [phone]

## 2018-05-27 NOTE — ED Triage Notes (Signed)
Pt to ER with c/o SHOB, pt with noted abdominal distention.  Pt states "need stomach drained."

## 2018-05-27 NOTE — Discharge Instructions (Addendum)
Return to the ER if you develop any worsening shortness of breath or any questions or concerns.

## 2018-05-28 LAB — PATHOLOGIST SMEAR REVIEW

## 2018-05-29 ENCOUNTER — Non-Acute Institutional Stay
Admission: EM | Admit: 2018-05-29 | Discharge: 2018-05-29 | Disposition: A | Discharge status: Home / Self Care | Payer: Medicare Other | Attending: Emergency Medicine | Admitting: Emergency Medicine

## 2018-05-29 ENCOUNTER — Other Ambulatory Visit: Payer: Self-pay

## 2018-05-29 DIAGNOSIS — K7290 Hepatic failure, unspecified without coma: Secondary | ICD-10-CM | POA: Diagnosis not present

## 2018-05-29 DIAGNOSIS — D631 Anemia in chronic kidney disease: Secondary | ICD-10-CM | POA: Diagnosis not present

## 2018-05-29 DIAGNOSIS — I120 Hypertensive chronic kidney disease with stage 5 chronic kidney disease or end stage renal disease: Secondary | ICD-10-CM | POA: Insufficient documentation

## 2018-05-29 DIAGNOSIS — I272 Pulmonary hypertension, unspecified: Secondary | ICD-10-CM | POA: Diagnosis not present

## 2018-05-29 DIAGNOSIS — N2581 Secondary hyperparathyroidism of renal origin: Secondary | ICD-10-CM | POA: Insufficient documentation

## 2018-05-29 DIAGNOSIS — I739 Peripheral vascular disease, unspecified: Secondary | ICD-10-CM | POA: Insufficient documentation

## 2018-05-29 DIAGNOSIS — Z992 Dependence on renal dialysis: Secondary | ICD-10-CM | POA: Insufficient documentation

## 2018-05-29 DIAGNOSIS — I2510 Atherosclerotic heart disease of native coronary artery without angina pectoris: Secondary | ICD-10-CM | POA: Insufficient documentation

## 2018-05-29 DIAGNOSIS — E875 Hyperkalemia: Secondary | ICD-10-CM | POA: Insufficient documentation

## 2018-05-29 DIAGNOSIS — E785 Hyperlipidemia, unspecified: Secondary | ICD-10-CM | POA: Diagnosis not present

## 2018-05-29 DIAGNOSIS — R188 Other ascites: Secondary | ICD-10-CM | POA: Insufficient documentation

## 2018-05-29 DIAGNOSIS — N186 End stage renal disease: Secondary | ICD-10-CM | POA: Diagnosis not present

## 2018-05-29 DIAGNOSIS — Q8781 Alport syndrome: Secondary | ICD-10-CM | POA: Diagnosis not present

## 2018-05-29 LAB — RENAL FUNCTION PANEL
Albumin: 2.4 g/dL — ABNORMAL LOW (ref 3.5–5.0)
Anion gap: 13 (ref 5–15)
BUN: 36 mg/dL — ABNORMAL HIGH (ref 6–20)
CO2: 36 mmol/L — ABNORMAL HIGH (ref 22–32)
Calcium: 8.6 mg/dL — ABNORMAL LOW (ref 8.9–10.3)
Chloride: 88 mmol/L — ABNORMAL LOW (ref 98–111)
Creatinine, Ser: 8.01 mg/dL — ABNORMAL HIGH (ref 0.61–1.24)
GFR calc Af Amer: 8 mL/min — ABNORMAL LOW (ref >60)
GFR calc non Af Amer: 7 mL/min — ABNORMAL LOW (ref >60)
Glucose, Bld: 104 mg/dL — ABNORMAL HIGH (ref 70–99)
Phosphorus: 4.4 mg/dL (ref 2.5–4.6)
Potassium: 5.9 mmol/L — ABNORMAL HIGH (ref 3.5–5.1)
Sodium: 137 mmol/L (ref 135–145)

## 2018-05-29 LAB — CBC
HCT: 26.3 % — ABNORMAL LOW (ref 39.0–52.0)
Hemoglobin: 7.3 g/dL — ABNORMAL LOW (ref 13.0–17.0)
MCH: 24.5 pg — AB (ref 26.0–34.0)
MCHC: 27.8 g/dL — ABNORMAL LOW (ref 30.0–36.0)
MCV: 88.3 fL (ref 80.0–100.0)
Platelets: 363 10*3/uL (ref 150–400)
RBC: 2.98 MIL/uL — AB (ref 4.22–5.81)
RDW: 17.5 % — ABNORMAL HIGH (ref 11.5–15.5)
WBC: 6.7 10*3/uL (ref 4.0–10.5)
nRBC: 0 % (ref 0.0–0.2)

## 2018-05-29 IMAGING — CR DG CHEST 2V
2 series · 2 of 2 positions shown · non-contrast
Comparison: None.

CLINICAL DATA: Shortness of breath

EXAM:
CHEST  2 VIEW

[chest pa]
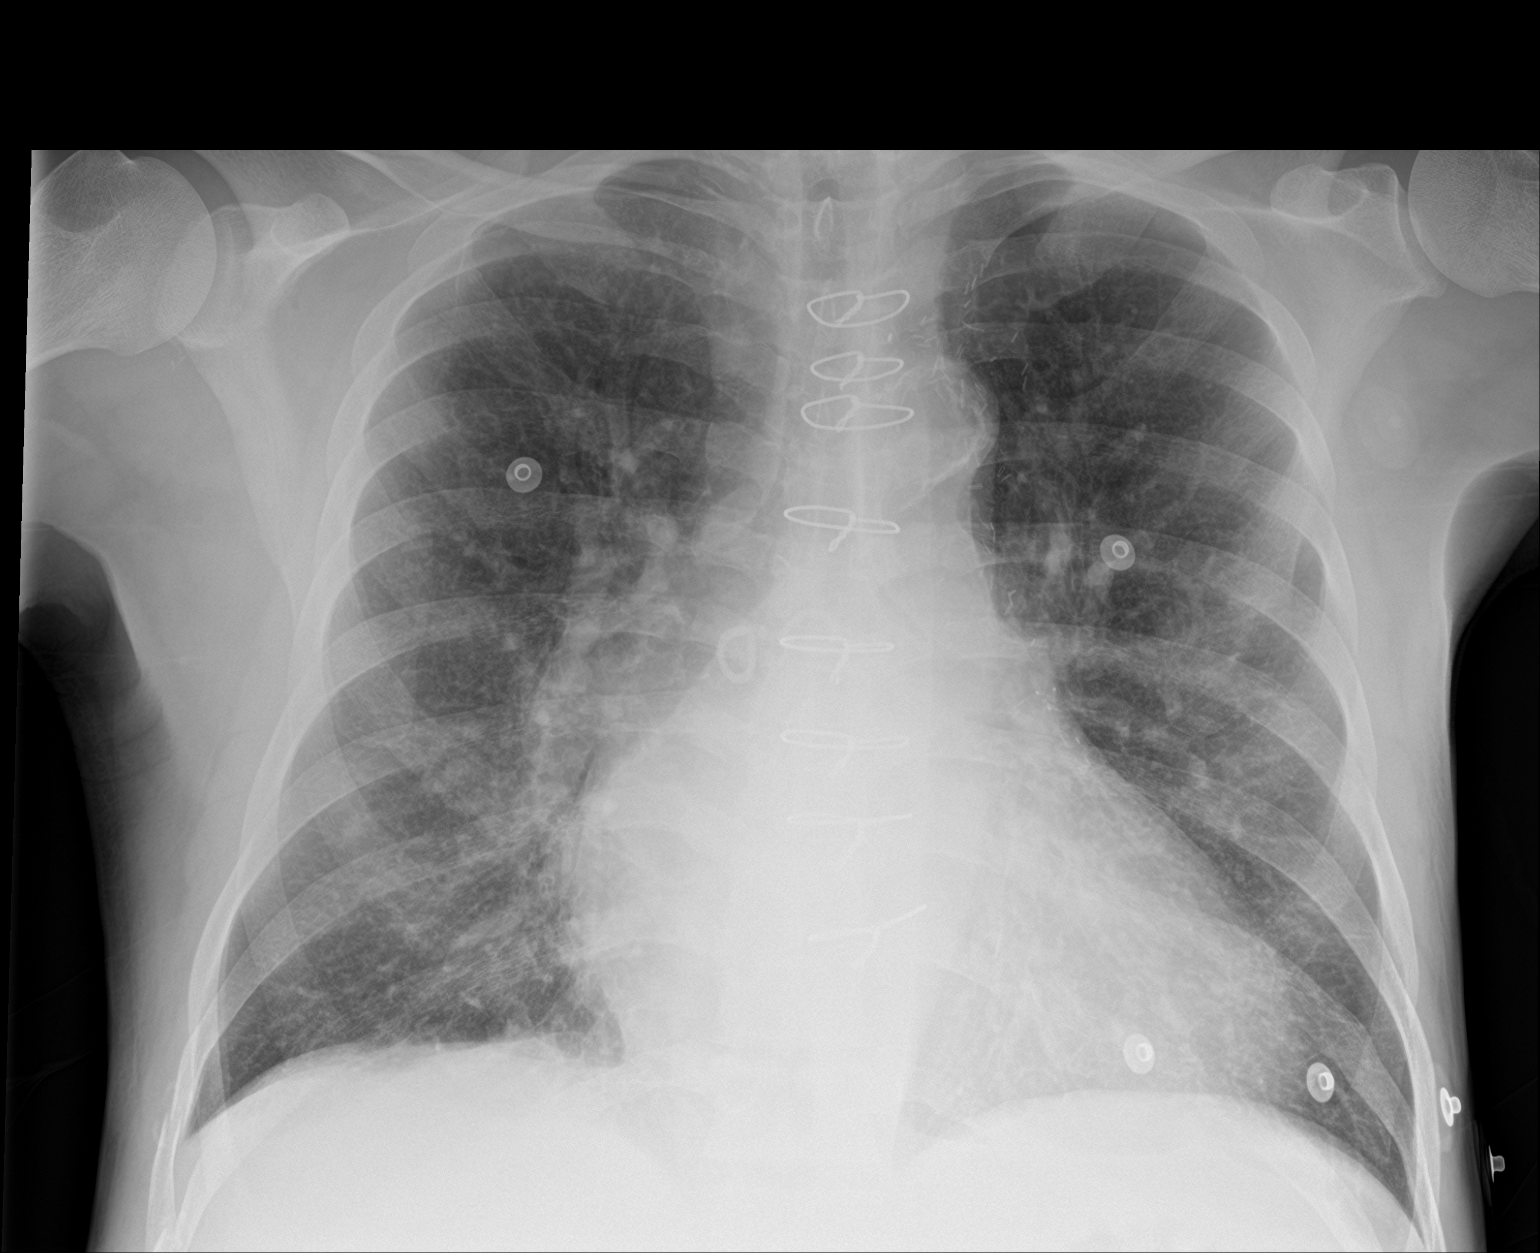

[chest lat]
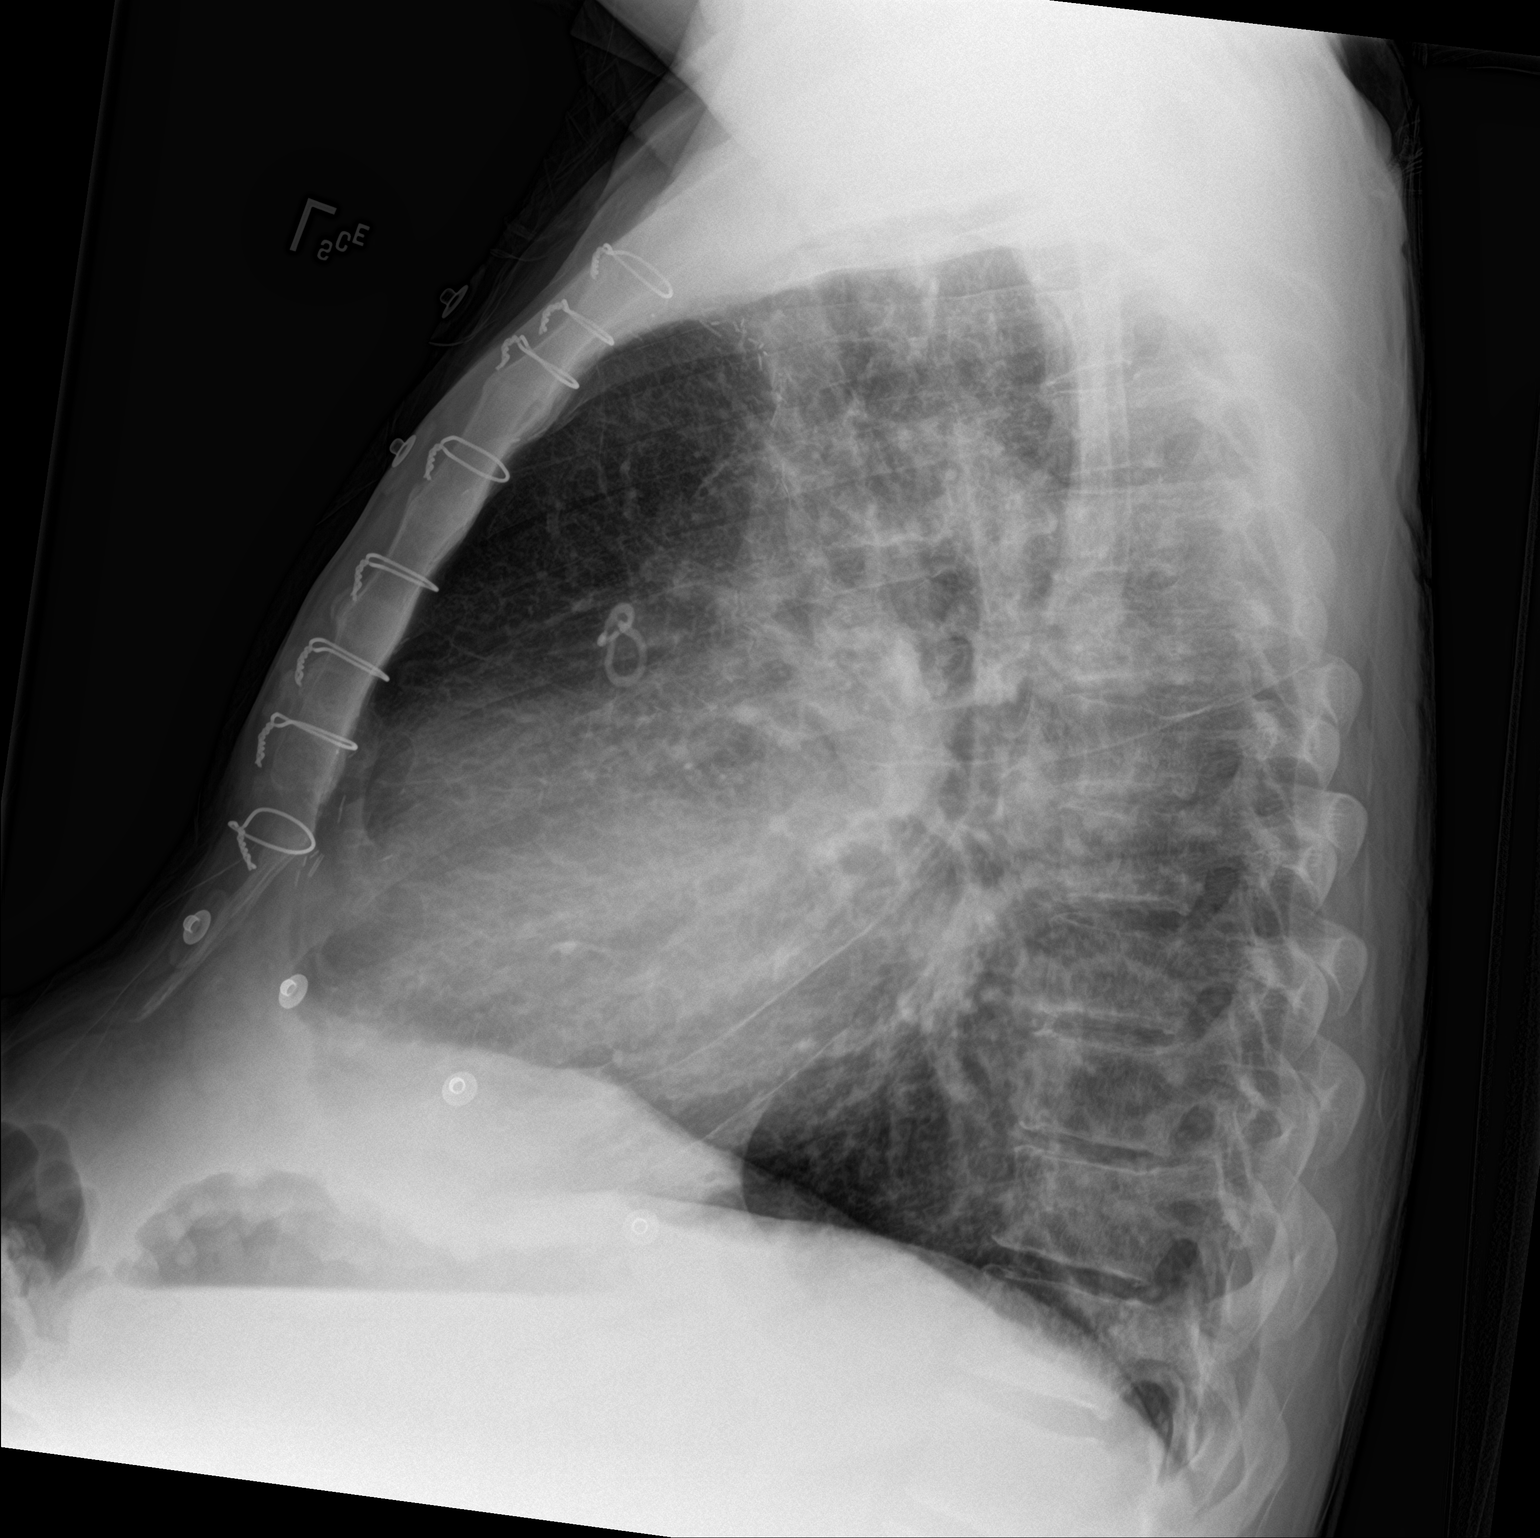

[2 of 2 positions shown; findings below may reference images not displayed]

FINDINGS: Cardiomediastinal silhouette is enlarged. There is calcific aortic
atherosclerosis. No focal airspace consolidation or pulmonary edema.
No pneumothorax or pleural effusion.
IMPRESSION: Unchanged cardiomegaly and aortic atherosclerosis without overt
pulmonary edema.

## 2018-05-29 MED ORDER — HEPARIN SODIUM (PORCINE) 1000 UNIT/ML DIALYSIS
1000.0000 [IU] | INTRAMUSCULAR | Status: DC | PRN
  Filled 2018-05-29: qty 1

## 2018-05-29 MED ORDER — ALTEPLASE 2 MG IJ SOLR
2.0000 mg | Freq: Once | INTRAMUSCULAR | Status: DC | PRN
  Filled 2018-05-29: qty 2

## 2018-05-29 MED ORDER — MORPHINE SULFATE (PF) 2 MG/ML IV SOLN
2.0000 mg | Freq: Once | INTRAVENOUS | Status: AC
  Administered 2018-05-29: 2 mg via INTRAVENOUS
  Filled 2018-05-29: qty 1

## 2018-05-29 MED ORDER — EPOETIN ALFA 20000 UNIT/ML IJ SOLN
14000.0000 [IU] | Freq: Once | INTRAMUSCULAR | Status: AC
  Administered 2018-05-29: 14000 [IU] via INTRAVENOUS
  Filled 2018-05-29: qty 1

## 2018-05-29 MED ORDER — DIPHENHYDRAMINE HCL 25 MG PO CAPS
25.0000 mg | ORAL_CAPSULE | Freq: Once | ORAL | Status: DC
  Filled 2018-05-29: qty 1

## 2018-05-29 MED ORDER — SODIUM CHLORIDE 0.9 % IV SOLN: 100.0000 mL | INTRAVENOUS | Status: DC | PRN

## 2018-05-29 MED ORDER — CHLORHEXIDINE GLUCONATE CLOTH 2 % EX PADS
6.0000 | MEDICATED_PAD | Freq: Every day | CUTANEOUS | Status: DC
  Filled 2018-05-29: qty 6

## 2018-05-29 MED ORDER — LIDOCAINE HCL (PF) 1 % IJ SOLN: 5.0000 mL | INTRAMUSCULAR | Status: DC | PRN

## 2018-05-29 MED ORDER — PENTAFLUOROPROP-TETRAFLUOROETH EX AERO: 1.0000 "application " | INHALATION_SPRAY | CUTANEOUS | Status: DC | PRN

## 2018-05-29 MED ORDER — LIDOCAINE-PRILOCAINE 2.5-2.5 % EX CREA: 1.0000 "application " | TOPICAL_CREAM | CUTANEOUS | Status: DC | PRN

## 2018-05-29 NOTE — ED Notes (Addendum)
Pt transported back from dialysis and placed in 12 hall. When waiting for MD to evaluate and print discharge paperwork, pt walked out of ED. Pt did not receive paperwork. Unable to obtain d/c vitals

## 2018-05-29 NOTE — Progress Notes (Signed)
Hemodialysis- Completed without issue. Report given to ED RN. Total UF 2L as ordered. Patient was also given 2mg  morphine for severe foot pain as well as epogen per Dr. Holley Raring. Tolerated well. All vitals stable post rinseback. Stable for discharge.

## 2018-05-29 NOTE — Progress Notes (Signed)
[facility]  ROUNDING NOTE   Subjective:  Patient presented to emergency department for dialysis today. Hyperkalemia noted. Patient seen and evaluated on dialysis and tolerating well.   Objective:  Vital signs in last 24 hours:  Temp:  [97.7 F (36.5 C)] 97.7 F (36.5 C) (03/17 1025) Pulse Rate:  [89-98] 98 (03/17 1245) Resp:  [14-22] 14 (03/17 1245) BP: (113-138)/(79-92) 133/92 (03/17 1245) SpO2:  [94 %-98 %] 95 % (03/17 1145) Weight:  [69.9 kg] 69.9 kg (03/17 1025)  Weight change:  Filed Weights   05/29/18 1011 05/29/18 1025  Weight: 69.9 kg 69.9 kg    Intake/Output: No intake/output data recorded.   Intake/Output this shift:  No intake/output data recorded.  Physical Exam: General: NAD, sitting in chair  Head: Normocephalic, atraumatic. Moist oral mucosal membranes  Neck: Supple,   Lungs:  Normal breathing effort CTAB  Heart: Regular rate and rhythm  Abdomen:  Soft, nontender, distended  Extremities: 1+ peripheral edema,    Neurologic: Nonfocal, moving all four extremities  Skin: No lesions  Access: RIJ permcath    Basic Metabolic Panel: Recent Labs  Lab 05/25/18 1430 05/27/18 1147 05/29/18 1036  NA 140 140 137  K 4.3 5.1 5.9*  CL 93* 89* 88*  CO2 33* 37* 36*  GLUCOSE 113* 86 104*  BUN 19 25* 36*  CREATININE 5.28* 6.06* 8.01*  CALCIUM 8.5* 8.3* 8.6*  PHOS 3.3  --  4.4    Liver Function Tests: Recent Labs  Lab 05/25/18 1430 05/27/18 1147 05/29/18 1036  AST  --  19  --   ALT  --  7  --   ALKPHOS  --  113  --   BILITOT  --  1.2  --   PROT  --  8.0  --   ALBUMIN 2.6* 2.7* 2.4*   No results for input(s): LIPASE, AMYLASE in the last 168 hours. No results for input(s): AMMONIA in the last 168 hours.  CBC: Recent Labs  Lab 05/25/18 1430 05/27/18 1147 05/29/18 1036  WBC 7.1 7.2 6.7  NEUTROABS  --  5.4  --   HGB 6.7* 8.1* 7.3*  HCT 24.4* 29.0* 26.3*  MCV 89.1 88.7 88.3  PLT 304 339 363    Cardiac Enzymes: No results  for input(s): CKTOTAL, CKMB, CKMBINDEX, TROPONINI in the last 168 hours.  BNP: Invalid input(s): POCBNP  CBG: No results for input(s): GLUCAP in the last 168 hours.  Microbiology: Results for orders placed or performed during the hospital encounter of 04/26/18  MRSA PCR Screening     Status: None   Collection Time: 04/26/18  1:09 PM  Result Value Ref Range Status   MRSA by PCR NEGATIVE NEGATIVE Final    Comment:        The GeneXpert MRSA Assay (FDA approved for NASAL specimens only), is one component of a comprehensive MRSA colonization surveillance program. It is not intended to diagnose MRSA infection nor to guide or monitor treatment for MRSA infections. Performed at [facility], [address] [place], [place] [address]     Coagulation Studies: No results for input(s): LABPROT, INR in the last 72 hours.  Urinalysis: No results for input(s): COLORURINE, LABSPEC, PHURINE, GLUCOSEU, HGBUR, BILIRUBINUR, KETONESUR, PROTEINUR, UROBILINOGEN, NITRITE, LEUKOCYTESUR in the last 72 hours.  Invalid input(s): APPERANCEUR    Imaging: No results found.   Medications:   . sodium chloride    . sodium chloride     . Chlorhexidine Gluconate Cloth  6 each Topical  [id number]  . diphenhydrAMINE  25 mg Oral Once in dialysis  . epoetin (EPOGEN/PROCRIT) injection  14,000 Units Intravenous Once in dialysis     Assessment/ Plan:  Mr. Jontue Crumpacker is a 58 y.o. black male withend stage renal disease on hemodialysis secondary to Alport's syndrome, end stage liver disease with ascites, hypertension, peripheral vascular disease, coronary artery disease, hyperlipidemia, gastrointestinal AVMs, pulmonary hypertension  [facility] ([facility]) RIJ permcath.210 min, currently no transportation to dialysis unit  1. Hyperkalemia - potassium noted to be 5.9 today.  Patient seen during dialysis.  We will use to get a urinalysis treatment.  2. End Stage Renal Disease: on  hemodialysis. Unable to get to his outpatient clinic. Unfortunately no other local clinic is willing to accept patient at this time. Patient is now getting his dialysis at [facility].  Seen and examined on hemodialysis treatment. Tolerating treatment well.  3. Anemia of chronic kidney disease: with multiple GI bleeds.  - hemoglobin noted as well.  Patient to be administered Epogen 14,000 units IV x1 today.  4. Secondary Hyperparathyroidism: continue periodically monitor bone mineral metabolism parameters.  5. Ascites- patient also has periodic paracentesis.   LOS: 0 Quinci Gavidia 3/17/202012:51 PM

## 2018-05-29 NOTE — ED Triage Notes (Signed)
Pt here for dialysis. Pt has paracentesis on Sunday, states its harder to breathe but otherwise okay.

## 2018-05-29 NOTE — ED Provider Notes (Signed)
-----------------------------------------   2:37 PM on 05/29/2018 -----------------------------------------  I was alerted by RN Tina Griffiths that this patient had return to the ED after dialysis.  I immediately went to see him but he had already left the ED.  The patient is well-known to this ED and usually does not want to wait for his paperwork after returning from dialysis.   Arta Silence, MD 05/29/18 1438

## 2018-05-29 NOTE — ED Notes (Signed)
PT transported to dialysis.

## 2018-05-29 NOTE — Progress Notes (Signed)
Hemodialysis- Initiated via R Chest HD catheter without issue. Dressing changed. No s/sx of infection to catheter exit site. Patient complains of 8/10 pain in foot and also some pain in abdomen from paracentesis he states he had Sunday. Medication ordered per Dr. Holley Raring. No other current complaints. Afebrile. 2k bath with labs currently pending. Continue to monitor.

## 2018-05-31 ENCOUNTER — Telehealth (INDEPENDENT_AMBULATORY_CARE_PROVIDER_SITE_OTHER): Payer: Self-pay | Admitting: Vascular Surgery

## 2018-06-02 ENCOUNTER — Encounter: Payer: Self-pay | Admitting: Emergency Medicine

## 2018-06-02 ENCOUNTER — Other Ambulatory Visit: Payer: Self-pay

## 2018-06-02 ENCOUNTER — Emergency Department
Admission: EM | Admit: 2018-06-02 | Discharge: 2018-06-02 | Disposition: A | Discharge status: Home / Self Care | Payer: Medicare Other | Attending: Emergency Medicine | Admitting: Emergency Medicine

## 2018-06-02 DIAGNOSIS — I132 Hypertensive heart and chronic kidney disease with heart failure and with stage 5 chronic kidney disease, or end stage renal disease: Secondary | ICD-10-CM | POA: Diagnosis not present

## 2018-06-02 DIAGNOSIS — Z79899 Other long term (current) drug therapy: Secondary | ICD-10-CM | POA: Diagnosis not present

## 2018-06-02 DIAGNOSIS — F17210 Nicotine dependence, cigarettes, uncomplicated: Secondary | ICD-10-CM | POA: Diagnosis not present

## 2018-06-02 DIAGNOSIS — R110 Nausea: Secondary | ICD-10-CM | POA: Insufficient documentation

## 2018-06-02 DIAGNOSIS — Z7982 Long term (current) use of aspirin: Secondary | ICD-10-CM | POA: Insufficient documentation

## 2018-06-02 DIAGNOSIS — I509 Heart failure, unspecified: Secondary | ICD-10-CM | POA: Diagnosis not present

## 2018-06-02 DIAGNOSIS — N186 End stage renal disease: Secondary | ICD-10-CM | POA: Diagnosis not present

## 2018-06-02 DIAGNOSIS — J449 Chronic obstructive pulmonary disease, unspecified: Secondary | ICD-10-CM | POA: Diagnosis not present

## 2018-06-02 DIAGNOSIS — Z992 Dependence on renal dialysis: Secondary | ICD-10-CM | POA: Diagnosis not present

## 2018-06-02 DIAGNOSIS — R531 Weakness: Secondary | ICD-10-CM | POA: Insufficient documentation

## 2018-06-02 DIAGNOSIS — Z951 Presence of aortocoronary bypass graft: Secondary | ICD-10-CM | POA: Diagnosis not present

## 2018-06-02 MED ORDER — CHLORHEXIDINE GLUCONATE CLOTH 2 % EX PADS
6.0000 | MEDICATED_PAD | Freq: Every day | CUTANEOUS | Status: DC
  Filled 2018-06-02 (×2): qty 6

## 2018-06-02 MED ORDER — SODIUM ZIRCONIUM CYCLOSILICATE 10 G PO PACK
10.0000 g | PACK | Freq: Once | ORAL | Status: AC
  Administered 2018-06-02: 10 g via ORAL
  Filled 2018-06-02: qty 1

## 2018-06-02 MED ORDER — DIPHENHYDRAMINE HCL 25 MG PO CAPS
25.0000 mg | ORAL_CAPSULE | Freq: Once | ORAL | Status: AC
  Administered 2018-06-02: 25 mg via ORAL
  Filled 2018-06-02: qty 1

## 2018-06-02 MED ORDER — MORPHINE SULFATE (PF) 2 MG/ML IV SOLN
2.0000 mg | Freq: Once | INTRAVENOUS | Status: AC
  Administered 2018-06-02: 2 mg via INTRAVENOUS
  Filled 2018-06-02: qty 1

## 2018-06-02 NOTE — ED Notes (Signed)
Pt brought back from dialysis by [facility] EDT.

## 2018-06-02 NOTE — ED Notes (Signed)
This RN has attempted to contact dialysis x 2 to inform them pt is here. Unable to get in touch with anyone.

## 2018-06-02 NOTE — ED Triage Notes (Signed)
Here for scheduled diaysis. States feels bad, states he has missed 2 treatments. Has been weak x 2 weeks.

## 2018-06-02 NOTE — Progress Notes (Signed)
Time wrong on treatment machine actual time of pre assessment 13:00

## 2018-06-02 NOTE — Progress Notes (Signed)
Time wrong on hd machine tx started at 13:15

## 2018-06-02 NOTE — ED Notes (Signed)
Pt ambulatory to toilet and back. Informed of wait for dialysis.

## 2018-06-02 NOTE — ED Notes (Signed)
Pt taken down to dialysis by [facility] EDT in a wheelchair. Pt didn't want to go down in bed.

## 2018-06-02 NOTE — ED Provider Notes (Signed)
Patient back from dialysis. Will discharge.    Nance Pear, MD 06/02/18 [phone]

## 2018-06-02 NOTE — ED Provider Notes (Addendum)
[facility] Emergency Department Provider Note  ____________________________________________   I have reviewed the triage vital signs and the nursing notes. Where available I have reviewed prior notes and, if possible and indicated, outside hospital notes.    HISTORY  Chief Complaint dialysis    HPI Stanley [name] is a 58 y.o. male history of cirrhosis of the liver CHF EtOH abuse drug abuse end-stage renal disease on dialysis who was unable to be dialyzed anywhere but the emergency department reportedly because of behavioral issues at dialysis centers.  Patient is very well-known to [name] has been here innumerable times, gets all of his dialysis here.  Last dialysis was on the 17th it appears patient states he feels generally weak as he usually does after missing dialysis.  He states that had no fever, he has been a little nauseated.  And he feels that he needs dialysis to feel better.  He does have a history of significant ascites which was tapped in the emergency department by Dr. Quentin Cornwall on the 15th.  He states that is starting to reaccumulate but not to the point where it is impeding his breathing like it did before.    Past Medical History:  Diagnosis Date  . Alcohol abuse   . CHF (congestive heart failure) ([place])   . Cirrhosis ([place])   . Coronary artery disease 2009  . Drug abuse ([place])   . End stage renal disease on dialysis [facility]) NEPHROLOGIST-   DR [facility]  IN [place]   HEMODIALYSIS --   TUES/  THURS/  SAT  . Gastrointestinal bleed 06/13/2017   From chart...hx of multiple GI bleeds  . GERD (gastroesophageal reflux disease)   . Hyperlipidemia   . Hypertension   . PAD (peripheral artery disease) ([place])   . Renal insufficiency    Per pt, 32 oz fluid restriction per day  . S/P triple vessel bypass 06/09/2016   2009ish  . Suicidal ideation    & HOMICIDAL IDEATION --  06-16-2013   ADMITTED TO BEHAVIOR HEALTH    Patient Active Problem List    Diagnosis Date Noted  . PAD (peripheral artery disease) ([place]) 04/14/2018  . Healthcare-associated pneumonia 01/15/2018  . Acute on chronic respiratory failure ([place]) 01/15/2018  . Acute pulmonary edema ([place]) 12/23/2017  . Acute respiratory failure ([place]) 10/29/2017  . ESRD (end stage renal disease) on dialysis ([place]) 07/28/2017  . Protein-calorie malnutrition, severe 06/14/2017  . Encounter for dialysis [facility])   . Palliative care by specialist   . Goals of care, counseling/discussion   . Malnutrition of moderate degree 06/05/2017  . Secondary esophageal varices without bleeding ([place])   . Stomach irritation   . Idiopathic esophageal varices without bleeding ([place])   . Alcoholic hepatitis with ascites 05/24/2017  . ESRD (end stage renal disease) ([place]) 04/28/2017  . Uremia 03/08/2017  . ESRD on hemodialysis ([place]) 03/03/2017  . Weakness 02/28/2017  . Hypocalcemia 02/22/2017  . Shortness of breath 11/26/2016  . COPD (chronic obstructive pulmonary disease) ([place]) 10/30/2016  . COPD exacerbation ([place]) 10/29/2016  . Anemia   . Heme positive stool   . Ulceration of intestine   . Benign neoplasm of transverse colon   . Acute gastrointestinal hemorrhage   . Esophageal candidiasis ([place])   . Angiodysplasia of intestinal tract   . Acute respiratory failure with hypoxia ([place]) 07/03/2016  . GI bleeding 06/24/2016  . Rectal bleeding 06/14/2016  . Anemia of chronic disease 06/01/2016  . MRSA carrier 06/01/2016  . Chronic renal  failure 05/23/2016  . Ischemic heart disease 05/23/2016  . Angiodysplasia of small intestine   . Melena   . Small bowel bleed not requiring more than 4 units of blood in 24 hours, ICU, or surgery   . Anemia due to chronic blood loss   . Abdominal pain 05/05/2016  . Acute posthemorrhagic anemia 04/17/2016  . Gastrointestinal bleed 04/17/2016  . History of esophagogastroduodenoscopy (EGD) 04/17/2016  . Elevated troponin 04/17/2016  . Alcohol abuse 04/17/2016  . Upper GI  bleed 01/19/2016  . Blood in stool   . Angiodysplasia of stomach and duodenum with hemorrhage   . Gastritis   . Reflux esophagitis   . GI bleed 05/16/2015  . Acute GI bleeding   . Symptomatic anemia 04/30/2015  . HTN (hypertension) 04/06/2015  . GERD (gastroesophageal reflux disease) 04/06/2015  . HLD (hyperlipidemia) 04/06/2015  . Dyspnea 04/06/2015  . Cirrhosis of liver with ascites ([place]) 04/06/2015  . Ascites 04/06/2015  . GIB (gastrointestinal bleeding) 03/23/2015  . Homicidal ideation 06/19/2013  . Suicidal intent 06/19/2013  . Homicidal ideations 06/19/2013  . Hyperkalemia 06/16/2013  . Mandible fracture ([place]) 06/05/2013  . Fracture, mandible ([place]) 06/02/2013  . Coronary atherosclerosis of native coronary artery 06/02/2013  . ESRD on dialysis ([place]) 06/02/2013  . Mandible open fracture ([place]) 06/02/2013    Past Surgical History:  Procedure Laterality Date  . A/V FISTULAGRAM Right 06/06/2017   Procedure: A/V FISTULAGRAM;  Surgeon: Katha Cabal, MD;  Location: [place] [facility];  Service: Cardiovascular;  Laterality: Right;  . A/V SHUNT INTERVENTION N/A 06/06/2017   Procedure: A/V SHUNT INTERVENTION;  Surgeon: Katha Cabal, MD;  Location: [place] [facility];  Service: Cardiovascular;  Laterality: N/A;  . AGILE CAPSULE N/A 06/19/2016   Procedure: AGILE CAPSULE;  Surgeon: [name], MD;  Location: [facility] ENDOSCOPY;  Service: Endoscopy;  Laterality: N/A;  . COLONOSCOPY WITH PROPOFOL N/A 06/18/2016   Procedure: COLONOSCOPY WITH PROPOFOL;  Surgeon: [name], MD;  Location: [facility] ENDOSCOPY;  Service: Endoscopy;  Laterality: N/A;  . COLONOSCOPY WITH PROPOFOL N/A 08/12/2016   Procedure: COLONOSCOPY WITH PROPOFOL;  Surgeon: [name], MD;  Location: [facility] ENDOSCOPY;  Service: Endoscopy;  Laterality: N/A;  . COLONOSCOPY WITH PROPOFOL N/A 05/05/2017   Procedure: COLONOSCOPY WITH PROPOFOL;  Surgeon: Manya Silvas, MD;  Location: [facility] ENDOSCOPY;  Service: Endoscopy;   Laterality: N/A;  . CORONARY ANGIOPLASTY  ?   PT UNABLE TO TELL IF  BEFORE OR AFTER  CABG  . CORONARY ARTERY BYPASS GRAFT  2008  ([place])   3 VESSEL  . DIALYSIS FISTULA CREATION  LAST SURGERY  APPOX  2008  . ENTEROSCOPY N/A 05/10/2016   Procedure: ENTEROSCOPY;  Surgeon: Jerene Bears, MD;  Location: [place];  Service: Gastroenterology;  Laterality: N/A;  . ENTEROSCOPY N/A 08/12/2016   Procedure: ENTEROSCOPY;  Surgeon: [name], MD;  Location: [facility] ENDOSCOPY;  Service: Endoscopy;  Laterality: N/A;  . ENTEROSCOPY Left 06/03/2017   Procedure: ENTEROSCOPY;  Surgeon: Virgel Manifold, MD;  Location: [facility] ENDOSCOPY;  Service: Endoscopy;  Laterality: Left;  Procedure date will ultimately depend on when patient is medically optimized before the procedure, pending hemodialysis and blood transfusions etc. Will place on schedule and change depending on clinical status.   . ENTEROSCOPY N/A 06/05/2017   Procedure: ENTEROSCOPY;  Surgeon: Virgel Manifold, MD;  Location: [facility] ENDOSCOPY;  Service: Endoscopy;  Laterality: N/A;  . ENTEROSCOPY N/A 06/15/2017   Procedure: Push ENTEROSCOPY;  Surgeon: [name], MD;  Location:  [place] ENDOSCOPY;  Service: Endoscopy;  Laterality: N/A;  . ESOPHAGOGASTRODUODENOSCOPY N/A 05/07/2015   Procedure: ESOPHAGOGASTRODUODENOSCOPY (EGD);  Surgeon: Hulen Luster, MD;  Location[facility];  Service: Endoscopy;  Laterality: N/A;  . ESOPHAGOGASTRODUODENOSCOPY (EGD) WITH PROPOFOL N/A 05/17/2015   Procedure: ESOPHAGOGASTRODUODENOSCOPY (EGD) WITH PROPOFOL;  Surgeon: [name], MD;  Location: [facility] ENDOSCOPY;  Service: Endoscopy;  Laterality: N/A;  . ESOPHAGOGASTRODUODENOSCOPY (EGD) WITH PROPOFOL N/A 01/20/2016   Procedure: ESOPHAGOGASTRODUODENOSCOPY (EGD) WITH PROPOFOL;  Surgeon: [name], MD;  Location: [facility] ENDOSCOPY;  Service: Endoscopy;  Laterality: N/A;  . ESOPHAGOGASTRODUODENOSCOPY (EGD) WITH PROPOFOL N/A 04/17/2016   Procedure: ESOPHAGOGASTRODUODENOSCOPY (EGD) WITH  PROPOFOL;  Surgeon: Lin Landsman, MD;  Location: [facility] ENDOSCOPY;  Service: Gastroenterology;  Laterality: N/A;  . ESOPHAGOGASTRODUODENOSCOPY (EGD) WITH PROPOFOL  05/09/2016   Procedure: ESOPHAGOGASTRODUODENOSCOPY (EGD) WITH PROPOFOL;  Surgeon: Jerene Bears, MD;  Location: [place];  Service: Endoscopy;;  . ESOPHAGOGASTRODUODENOSCOPY (EGD) WITH PROPOFOL N/A 06/16/2016   Procedure: ESOPHAGOGASTRODUODENOSCOPY (EGD) WITH PROPOFOL;  Surgeon: [name], MD;  Location: [facility] ENDOSCOPY;  Service: Endoscopy;  Laterality: N/A;  . ESOPHAGOGASTRODUODENOSCOPY (EGD) WITH PROPOFOL N/A 05/05/2017   Procedure: ESOPHAGOGASTRODUODENOSCOPY (EGD) WITH PROPOFOL;  Surgeon: Manya Silvas, MD;  Location: [facility] ENDOSCOPY;  Service: Endoscopy;  Laterality: N/A;  . ESOPHAGOGASTRODUODENOSCOPY (EGD) WITH PROPOFOL N/A 06/15/2017   Procedure: ESOPHAGOGASTRODUODENOSCOPY (EGD) WITH PROPOFOL;  Surgeon: [name], MD;  Location: [facility] ENDOSCOPY;  Service: Endoscopy;  Laterality: N/A;  . ESOPHAGOGASTRODUODENOSCOPY (EGD) WITH PROPOFOL N/A 04/27/2018   Procedure: ESOPHAGOGASTRODUODENOSCOPY (EGD) WITH PROPOFOL;  Surgeon: [name], MD;  Location: [facility] ENDOSCOPY;  Service: Endoscopy;  Laterality: N/A;  . GIVENS CAPSULE STUDY N/A 05/07/2016   Procedure: GIVENS CAPSULE STUDY;  Surgeon: Doran Stabler, MD;  Location: [place];  Service: Endoscopy;  Laterality: N/A;  . LOWER EXTREMITY ANGIOGRAPHY Left 04/19/2018   Procedure: LOWER EXTREMITY ANGIOGRAPHY;  Surgeon: Algernon Huxley, MD;  Location: [place] [facility];  Service: Cardiovascular;  Laterality: Left;  Marland Kitchen MANDIBULAR HARDWARE REMOVAL N/A 07/29/2013   Procedure: REMOVAL OF ARCH BARS;  Surgeon: Theodoro Kos, DO;  Location: [place];  Service: Plastics;  Laterality: N/A;  . ORIF MANDIBULAR FRACTURE N/A 06/05/2013   Procedure: REPAIR OF MANDIBULAR FRACTURE x 2 with maxillo-mandibular fixation ;  Surgeon: Theodoro Kos, DO;  Location: [place];  Service: Plastics;   Laterality: N/A;  . PARACENTESIS    . PERIPHERAL ARTERIAL STENT GRAFT Left     Prior to Admission medications   Medication Sig Start Date End Date Taking? Authorizing Provider  acetaminophen (TYLENOL) 325 MG tablet Take 2 tablets (650 mg total) by mouth every 6 (six) hours as needed for mild pain (or Fever >/= 101). 05/07/18   Gouru, Aruna, MD  alum & mag hydroxide-simeth (MAALOX/MYLANTA) 200-200-20 MG/5ML suspension Take 30 mLs by mouth every 4 (four) hours as needed for indigestion. 05/07/18   Nicholes Mango, MD  aspirin EC 81 MG tablet Take 1 tablet (81 mg total) by mouth daily. 04/20/18 04/20/19  Epifanio Lesches, MD  budesonide-formoterol (SYMBICORT) 160-4.5 MCG/ACT inhaler Inhale 2 puffs into the lungs daily. 05/07/18   Nicholes Mango, MD  calcium acetate (PHOSLO) 667 MG capsule Take 2,001 mg by mouth 3 (three) times daily.    [provider]  docusate sodium (COLACE) 100 MG capsule Take 1 capsule (100 mg total) by mouth 2 (two) times daily as needed for mild constipation. 05/07/18   Nicholes Mango, MD  folic acid (FOLVITE) 1 MG tablet Take 1 tablet (1 mg total)  by mouth daily. 10/31/17   Fritzi Mandes, MD  gabapentin (NEURONTIN) 300 MG capsule Take 300 mg by mouth 3 (three) times daily.  08/16/16   [provider]  ipratropium-albuterol (DUONEB) 0.5-2.5 (3) MG/3ML SOLN Take 3 mLs by nebulization every 6 (six) hours as needed. 05/07/18   Nicholes Mango, MD  labetalol (NORMODYNE) 100 MG tablet Take 100 mg by mouth daily.    [provider]  multivitamin (RENA-VIT) TABS tablet Take 1 tablet by mouth at bedtime. 10/31/17   Fritzi Mandes, MD  Nutritional Supplements (FEEDING SUPPLEMENT, NEPRO CARB STEADY,) LIQD Take 237 mLs by mouth 2 (two) times daily between meals. 05/07/18   Nicholes Mango, MD  omeprazole (PRILOSEC) 40 MG capsule Take 1 capsule (40 mg total) by mouth 2 (two) times daily. 05/07/18   Gouru, Illene Silver, MD  oxyCODONE (OXY IR/ROXICODONE) 5 MG immediate release tablet Take 1 tablet  (5 mg total) by mouth every 6 (six) hours as needed for moderate pain. 05/07/18   Gouru, Illene Silver, MD  sucralfate (CARAFATE) 1 GM/10ML suspension Take 10 mLs (1 g total) by mouth 4 (four) times daily -  with meals and at bedtime. 05/07/18   Gouru, Illene Silver, MD  tiotropium (SPIRIVA HANDIHALER) 18 MCG inhalation capsule Place 1 capsule (18 mcg total) into inhaler and inhale daily. 05/07/18 08/05/18  Nicholes Mango, MD  vitamin C (VITAMIN C) 250 MG tablet Take 1 tablet (250 mg total) by mouth 2 (two) times daily. 04/20/18   Epifanio Lesches, MD    Allergies Patient has no known allergies.  Family History  Problem Relation Age of Onset  . Colon cancer Mother   . Cancer Father   . Cancer Sister   . Kidney disease Brother     Social History Social History   Tobacco Use  . Smoking status: Current Every Day Smoker    Packs/day: 0.15    Years: 40.00    Pack years: 6.00    Types: Cigarettes  . Smokeless tobacco: Never Used  Substance Use Topics  . Alcohol use: No    Comment: pt reports quitting after learning about cirrhosis  . Drug use: No    Frequency: 7.0 times per week    Types: Marijuana, Cocaine    Review of Systems Constitutional: No fever/chills Eyes: No visual changes. ENT: No sore throat. No stiff neck no neck pain Cardiovascular: Denies chest pain. Respiratory: Denies shortness of breath. Gastrointestinal: Positive nausea no diarrhea.  No constipation. Genitourinary: Negative for dysuria. Musculoskeletal: Negative lower extremity swelling Skin: Negative for rash. Neurological: Negative for severe headaches, focal weakness or numbness.   ____________________________________________   PHYSICAL EXAM:  VITAL SIGNS: ED Triage Vitals  Enc Vitals Group     BP 06/02/18 0941 134/85     Pulse Rate 06/02/18 0941 92     Resp 06/02/18 0941 20     Temp 06/02/18 0941 98 F (36.7 C)     Temp Source 06/02/18 0941 Oral     SpO2 06/02/18 0941 95 %     Weight 06/02/18 0944 167 lb  (75.8 kg)     Height 06/02/18 0944 6\' 3"  (1.905 m)     Head Circumference --      Peak Flow --      Pain Score 06/02/18 0944 0     Pain Loc --      Pain Edu? --      Excl. in [place]? --     Constitutional: Alert and oriented. Well appearing and in no acute  distress. Eyes: Conjunctivae are normal Head: Atraumatic HEENT: No congestion/rhinnorhea. Mucous membranes are moist.  Oropharynx non-erythematous Neck:   Nontender with no meningismus, no masses, no stridor Cardiovascular: Normal rate, regular rhythm. Grossly normal heart sounds.  Good peripheral circulation. Respiratory: Normal respiratory effort.  No retractions. Lungs CTAB. Abdominal: Soft and nontender but diffuse ascites noted not tense.  No guarding no rebound Back:  There is no focal tenderness or step off.  there is no midline tenderness there are no lesions noted. there is no CVA tenderness Musculoskeletal: No lower extremity tenderness, no upper extremity tenderness. No joint effusions, no DVT signs strong distal pulses no edema Neurologic:  Normal speech and language. No gross focal neurologic deficits are appreciated.  Skin:  Skin is warm, dry and intact. No rash noted. Psychiatric: Mood and affect are normal. Speech and behavior are normal.  ____________________________________________   LABS (all labs ordered are listed, but only abnormal results are displayed)  Labs Reviewed - No data to display  Pertinent labs  results that were available during my care of the patient were reviewed by me and considered in my medical decision making (see chart for details). ____________________________________________  EKG  I personally interpreted any EKGs ordered by me or triage Sinus rhythm rate 90 bpm normal axis no acute ST elevation depression nonspecific ST changes, no peaked T waves. ____________________________________________  RADIOLOGY  Pertinent labs & imaging results that were available during my care of the  patient were reviewed by me and considered in my medical decision making (see chart for details). If possible, patient and/or family made aware of any abnormal findings.  No results found. ____________________________________________    PROCEDURES  Procedure(s) performed: None  Procedures  Critical Care performed: None  ____________________________________________   INITIAL IMPRESSION / ASSESSMENT AND PLAN / ED COURSE  Pertinent labs & imaging results that were available during my care of the patient were reviewed by me and considered in my medical decision making (see chart for details).  Patient here for routine dialysis, however he did miss some dialysis.  We will call to see if we can get him dialyzed.  Patient has a history of noncompliance.  He usually comes in every 2 to 3 days here sometimes longer.  His last visit here was 17th, today's the 21st.  He feels poorly likely because he needs dialysis.  We will see if we can get that done.  If he still feels bad afterwards we will consider further work-up.  At this time, vital signs are normal and he has a reassuring exam.  Not infrequently patient comes in stating he feels bad gets dialysis and demands to leave as soon as he is done. Talked to Dr. Zollie Scale, who will try to get the patient down stairs.  Agrees with management.    ____________________________________________   FINAL CLINICAL IMPRESSION(S) / ED DIAGNOSES  Final diagnoses:  None      This chart was dictated using voice recognition software.  Despite best efforts to proofread,  errors can occur which can change meaning.      Schuyler Amor, MD 06/02/18 1009    Schuyler Amor, MD 06/02/18 1031

## 2018-06-02 NOTE — ED Notes (Signed)
Pt here for dialysis, states he's missed 2 appts. States "I feel bad" no resp distress noted, speaking in complete sentences. abd appears somewhat distended. Given milk, ok per EDP.

## 2018-06-02 NOTE — ED Notes (Signed)
Dialysis called and stated to bring pt down at 12:40 with pt labels.

## 2018-06-04 LAB — HEPATITIS B SURFACE ANTIBODY, QUANTITATIVE: HEPATITIS B-POST: 46.9 m[IU]/mL

## 2018-06-04 LAB — HEPATITIS B SURFACE ANTIGEN: Hepatitis B Surface Ag: NEGATIVE

## 2018-06-05 ENCOUNTER — Emergency Department
Admission: EM | Admit: 2018-06-05 | Discharge: 2018-06-05 | Disposition: A | Discharge status: Home / Self Care | Payer: Medicare Other | Attending: Emergency Medicine | Admitting: Emergency Medicine

## 2018-06-05 ENCOUNTER — Emergency Department: Payer: Medicare Other

## 2018-06-05 ENCOUNTER — Other Ambulatory Visit: Payer: Self-pay

## 2018-06-05 DIAGNOSIS — J810 Acute pulmonary edema: Secondary | ICD-10-CM | POA: Insufficient documentation

## 2018-06-05 DIAGNOSIS — E785 Hyperlipidemia, unspecified: Secondary | ICD-10-CM | POA: Insufficient documentation

## 2018-06-05 DIAGNOSIS — I132 Hypertensive heart and chronic kidney disease with heart failure and with stage 5 chronic kidney disease, or end stage renal disease: Secondary | ICD-10-CM | POA: Insufficient documentation

## 2018-06-05 DIAGNOSIS — E875 Hyperkalemia: Secondary | ICD-10-CM

## 2018-06-05 DIAGNOSIS — Z79899 Other long term (current) drug therapy: Secondary | ICD-10-CM | POA: Insufficient documentation

## 2018-06-05 DIAGNOSIS — D631 Anemia in chronic kidney disease: Secondary | ICD-10-CM | POA: Diagnosis not present

## 2018-06-05 DIAGNOSIS — N186 End stage renal disease: Secondary | ICD-10-CM | POA: Diagnosis not present

## 2018-06-05 DIAGNOSIS — R531 Weakness: Secondary | ICD-10-CM | POA: Diagnosis not present

## 2018-06-05 DIAGNOSIS — R0902 Hypoxemia: Secondary | ICD-10-CM | POA: Diagnosis not present

## 2018-06-05 DIAGNOSIS — F17210 Nicotine dependence, cigarettes, uncomplicated: Secondary | ICD-10-CM | POA: Insufficient documentation

## 2018-06-05 DIAGNOSIS — Z992 Dependence on renal dialysis: Secondary | ICD-10-CM | POA: Diagnosis not present

## 2018-06-05 DIAGNOSIS — I509 Heart failure, unspecified: Secondary | ICD-10-CM | POA: Diagnosis not present

## 2018-06-05 DIAGNOSIS — R0689 Other abnormalities of breathing: Secondary | ICD-10-CM | POA: Diagnosis not present

## 2018-06-05 DIAGNOSIS — I120 Hypertensive chronic kidney disease with stage 5 chronic kidney disease or end stage renal disease: Secondary | ICD-10-CM | POA: Diagnosis not present

## 2018-06-05 DIAGNOSIS — I2510 Atherosclerotic heart disease of native coronary artery without angina pectoris: Secondary | ICD-10-CM | POA: Diagnosis not present

## 2018-06-05 DIAGNOSIS — R0602 Shortness of breath: Secondary | ICD-10-CM | POA: Diagnosis not present

## 2018-06-05 LAB — COMPREHENSIVE METABOLIC PANEL
ALT: 9 U/L (ref 0–44)
AST: 15 U/L (ref 15–41)
Albumin: 2.7 g/dL — ABNORMAL LOW (ref 3.5–5.0)
Alkaline Phosphatase: 145 U/L — ABNORMAL HIGH (ref 38–126)
Anion gap: 11 (ref 5–15)
BUN: 36 mg/dL — ABNORMAL HIGH (ref 6–20)
CO2: 31 mmol/L (ref 22–32)
Calcium: 8.7 mg/dL — ABNORMAL LOW (ref 8.9–10.3)
Chloride: 97 mmol/L — ABNORMAL LOW (ref 98–111)
Creatinine, Ser: 8.05 mg/dL — ABNORMAL HIGH (ref 0.61–1.24)
GFR calc Af Amer: 8 mL/min — ABNORMAL LOW (ref >60)
GFR calc non Af Amer: 7 mL/min — ABNORMAL LOW (ref >60)
Glucose, Bld: 89 mg/dL (ref 70–99)
Potassium: 7.3 mmol/L (ref 3.5–5.1)
Sodium: 139 mmol/L (ref 135–145)
Total Bilirubin: 0.8 mg/dL (ref 0.3–1.2)
Total Protein: 7.9 g/dL (ref 6.5–8.1)

## 2018-06-05 LAB — CBC
HCT: 28.2 % — ABNORMAL LOW (ref 39.0–52.0)
Hemoglobin: 7.7 g/dL — ABNORMAL LOW (ref 13.0–17.0)
MCH: 23.5 pg — ABNORMAL LOW (ref 26.0–34.0)
MCHC: 27.3 g/dL — ABNORMAL LOW (ref 30.0–36.0)
MCV: 86 fL (ref 80.0–100.0)
PLATELETS: 367 10*3/uL (ref 150–400)
RBC: 3.28 MIL/uL — ABNORMAL LOW (ref 4.22–5.81)
RDW: 17.1 % — AB (ref 11.5–15.5)
WBC: 7.3 10*3/uL (ref 4.0–10.5)
nRBC: 0 % (ref 0.0–0.2)

## 2018-06-05 MED ORDER — DIPHENHYDRAMINE HCL 50 MG/ML IJ SOLN
25.0000 mg | Freq: Once | INTRAMUSCULAR | Status: AC
  Administered 2018-06-05: 25 mg via INTRAVENOUS

## 2018-06-05 MED ORDER — MORPHINE SULFATE (PF) 2 MG/ML IV SOLN
2.0000 mg | Freq: Once | INTRAVENOUS | Status: AC
  Administered 2018-06-05: 2 mg via INTRAVENOUS
  Filled 2018-06-05: qty 1

## 2018-06-05 MED ORDER — CHLORHEXIDINE GLUCONATE CLOTH 2 % EX PADS
6.0000 | MEDICATED_PAD | Freq: Every day | CUTANEOUS | Status: DC
  Filled 2018-06-05: qty 6

## 2018-06-05 MED ORDER — EPOETIN ALFA 10000 UNIT/ML IJ SOLN
10000.0000 [IU] | Freq: Once | INTRAMUSCULAR | Status: AC
  Administered 2018-06-05: 10000 [IU] via INTRAVENOUS
  Filled 2018-06-05: qty 1

## 2018-06-05 NOTE — Progress Notes (Signed)
HD Tx End   06/05/18 1415  Vital Signs  Pulse Rate 97  Resp (!) 21  BP 111/70  Oxygen Therapy  SpO2 100 %  During Hemodialysis Assessment  Blood Flow Rate (mL/min) 200 mL/min  Arterial Pressure (mmHg) -190 mmHg  Venous Pressure (mmHg) 160 mmHg  Transmembrane Pressure (mmHg) 60 mmHg  Ultrafiltration Rate (mL/min) 1170 mL/min  Dialysate Flow Rate (mL/min) 800 ml/min  Conductivity: Machine  14  HD Safety Checks Performed Yes  Dialysis Fluid Bolus Normal Saline  Bolus Amount (mL) 250 mL  Intra-Hemodialysis Comments Tolerated well;Tx completed (pt restless, yet tolerating tx well.watching tv)

## 2018-06-05 NOTE — ED Provider Notes (Signed)
[facility] Emergency Department Provider Note   ____________________________________________    I have reviewed the triage vital signs and the nursing notes.   HISTORY  Chief Complaint Shortness of Breath     HPI Stanley [name] is a 58 y.o. male who presents with complaints of shortness of breath and feeling weak.  Patient reports he missed dialysis yesterday, last dialysis was on Saturday.  Typically he has Monday Wednesday Friday but has a long history of poor compliance.  Also has extensive past medical history as listed below.  Denies abdominal pain or chest pain or palpitations.  Denies fevers or chills.  Occasional cough   Past Medical History:  Diagnosis Date  . Alcohol abuse   . CHF (congestive heart failure) ([place])   . Cirrhosis ([place])   . Coronary artery disease 2009  . Drug abuse ([place])   . End stage renal disease on dialysis [facility]) NEPHROLOGIST-   DR [facility]  IN    HEMODIALYSIS --   TUES/  THURS/  SAT  . Gastrointestinal bleed 06/13/2017   From chart...hx of multiple GI bleeds  . GERD (gastroesophageal reflux disease)   . Hyperlipidemia   . Hypertension   . PAD (peripheral artery disease) ([place])   . Renal insufficiency    Per pt, 32 oz fluid restriction per day  . S/P triple vessel bypass 06/09/2016   2009ish  . Suicidal ideation    & HOMICIDAL IDEATION --  06-16-2013   ADMITTED TO BEHAVIOR HEALTH    Patient Active Problem List   Diagnosis Date Noted  . PAD (peripheral artery disease) ([place]) 04/14/2018  . Healthcare-associated pneumonia 01/15/2018  . Acute on chronic respiratory failure ([place]) 01/15/2018  . Acute pulmonary edema ([place]) 12/23/2017  . Acute respiratory failure ([place]) 10/29/2017  . ESRD (end stage renal disease) on dialysis ([place]) 07/28/2017  . Protein-calorie malnutrition, severe 06/14/2017  . Encounter for dialysis [facility])   . Palliative care by specialist   . Goals of care, counseling/discussion   .  Malnutrition of moderate degree 06/05/2017  . Secondary esophageal varices without bleeding ([place])   . Stomach irritation   . Idiopathic esophageal varices without bleeding ([place])   . Alcoholic hepatitis with ascites 05/24/2017  . ESRD (end stage renal disease) ([place]) 04/28/2017  . Uremia 03/08/2017  . ESRD on hemodialysis ([place]) 03/03/2017  . Weakness 02/28/2017  . Hypocalcemia 02/22/2017  . Shortness of breath 11/26/2016  . COPD (chronic obstructive pulmonary disease) ([place]) 10/30/2016  . COPD exacerbation ([place]) 10/29/2016  . Anemia   . Heme positive stool   . Ulceration of intestine   . Benign neoplasm of transverse colon   . Acute gastrointestinal hemorrhage   . Esophageal candidiasis ([place])   . Angiodysplasia of intestinal tract   . Acute respiratory failure with hypoxia ([place]) 07/03/2016  . GI bleeding 06/24/2016  . Rectal bleeding 06/14/2016  . Anemia of chronic disease 06/01/2016  . MRSA carrier 06/01/2016  . Chronic renal failure 05/23/2016  . Ischemic heart disease 05/23/2016  . Angiodysplasia of small intestine   . Melena   . Small bowel bleed not requiring more than 4 units of blood in 24 hours, ICU, or surgery   . Anemia due to chronic blood loss   . Abdominal pain 05/05/2016  . Acute posthemorrhagic anemia 04/17/2016  . Gastrointestinal bleed 04/17/2016  . History of esophagogastroduodenoscopy (EGD) 04/17/2016  . Elevated troponin 04/17/2016  . Alcohol abuse 04/17/2016  . Upper GI bleed 01/19/2016  .  Blood in stool   . Angiodysplasia of stomach and duodenum with hemorrhage   . Gastritis   . Reflux esophagitis   . GI bleed 05/16/2015  . Acute GI bleeding   . Symptomatic anemia 04/30/2015  . HTN (hypertension) 04/06/2015  . GERD (gastroesophageal reflux disease) 04/06/2015  . HLD (hyperlipidemia) 04/06/2015  . Dyspnea 04/06/2015  . Cirrhosis of liver with ascites ([place]) 04/06/2015  . Ascites 04/06/2015  . GIB (gastrointestinal bleeding) 03/23/2015  . Homicidal  ideation 06/19/2013  . Suicidal intent 06/19/2013  . Homicidal ideations 06/19/2013  . Hyperkalemia 06/16/2013  . Mandible fracture ([place]) 06/05/2013  . Fracture, mandible ([place]) 06/02/2013  . Coronary atherosclerosis of native coronary artery 06/02/2013  . ESRD on dialysis ([place]) 06/02/2013  . Mandible open fracture ([place]) 06/02/2013    Past Surgical History:  Procedure Laterality Date  . A/V FISTULAGRAM Right 06/06/2017   Procedure: A/V FISTULAGRAM;  Surgeon: Katha Cabal, MD;  Location: [place] CV [facility];  Service: Cardiovascular;  Laterality: Right;  . A/V SHUNT INTERVENTION N/A 06/06/2017   Procedure: A/V SHUNT INTERVENTION;  Surgeon: Katha Cabal, MD;  Location: [place] CV [facility];  Service: Cardiovascular;  Laterality: N/A;  . AGILE CAPSULE N/A 06/19/2016   Procedure: AGILE CAPSULE;  Surgeon: [name], MD;  Location: [facility] ENDOSCOPY;  Service: Endoscopy;  Laterality: N/A;  . COLONOSCOPY WITH PROPOFOL N/A 06/18/2016   Procedure: COLONOSCOPY WITH PROPOFOL;  Surgeon: [name], MD;  Location: [facility] ENDOSCOPY;  Service: Endoscopy;  Laterality: N/A;  . COLONOSCOPY WITH PROPOFOL N/A 08/12/2016   Procedure: COLONOSCOPY WITH PROPOFOL;  Surgeon: [name], MD;  Location: [facility] ENDOSCOPY;  Service: Endoscopy;  Laterality: N/A;  . COLONOSCOPY WITH PROPOFOL N/A 05/05/2017   Procedure: COLONOSCOPY WITH PROPOFOL;  Surgeon: Manya Silvas, MD;  Location: [facility] ENDOSCOPY;  Service: Endoscopy;  Laterality: N/A;  . CORONARY ANGIOPLASTY  ?   PT UNABLE TO TELL IF  BEFORE OR AFTER  CABG  . CORONARY ARTERY BYPASS GRAFT  2008  ([place] , [place])   3 VESSEL  . DIALYSIS FISTULA CREATION  LAST SURGERY  APPOX  2008  . ENTEROSCOPY N/A 05/10/2016   Procedure: ENTEROSCOPY;  Surgeon: Jerene Bears, MD;  Location: [place];  Service: Gastroenterology;  Laterality: N/A;  . ENTEROSCOPY N/A 08/12/2016   Procedure: ENTEROSCOPY;  Surgeon: [name], MD;  Location: [facility] ENDOSCOPY;  Service: Endoscopy;   Laterality: N/A;  . ENTEROSCOPY Left 06/03/2017   Procedure: ENTEROSCOPY;  Surgeon: Virgel Manifold, MD;  Location: [facility] ENDOSCOPY;  Service: Endoscopy;  Laterality: Left;  Procedure date will ultimately depend on when patient is medically optimized before the procedure, pending hemodialysis and blood transfusions etc. Will place on schedule and change depending on clinical status.   . ENTEROSCOPY N/A 06/05/2017   Procedure: ENTEROSCOPY;  Surgeon: Virgel Manifold, MD;  Location: [facility] ENDOSCOPY;  Service: Endoscopy;  Laterality: N/A;  . ENTEROSCOPY N/A 06/15/2017   Procedure: Push ENTEROSCOPY;  Surgeon: [name], MD;  Location[facility] ENDOSCOPY;  Service: Endoscopy;  Laterality: N/A;  . ESOPHAGOGASTRODUODENOSCOPY N/A 05/07/2015   Procedure: ESOPHAGOGASTRODUODENOSCOPY (EGD);  Surgeon: Hulen Luster, MD;  Location: [facility] ENDOSCOPY;  Service: Endoscopy;  Laterality: N/A;  . ESOPHAGOGASTRODUODENOSCOPY (EGD) WITH PROPOFOL N/A 05/17/2015   Procedure: ESOPHAGOGASTRODUODENOSCOPY (EGD) WITH PROPOFOL;  Surgeon: [name], MD;  Location: [facility] ENDOSCOPY;  Service: Endoscopy;  Laterality: N/A;  . ESOPHAGOGASTRODUODENOSCOPY (EGD) WITH PROPOFOL N/A 01/20/2016   Procedure: ESOPHAGOGASTRODUODENOSCOPY (EGD) WITH PROPOFOL;  Surgeon: [name], MD;  Location: [facility] ENDOSCOPY;  Service: Endoscopy;  Laterality: N/A;  . ESOPHAGOGASTRODUODENOSCOPY (EGD) WITH PROPOFOL N/A 04/17/2016   Procedure: ESOPHAGOGASTRODUODENOSCOPY (EGD) WITH PROPOFOL;  Surgeon: Lin Landsman, MD;  Location: [facility] ENDOSCOPY;  Service: Gastroenterology;  Laterality: N/A;  . ESOPHAGOGASTRODUODENOSCOPY (EGD) WITH PROPOFOL  05/09/2016   Procedure: ESOPHAGOGASTRODUODENOSCOPY (EGD) WITH PROPOFOL;  Surgeon: Jerene Bears, MD;  Location: [place];  Service: Endoscopy;;  . ESOPHAGOGASTRODUODENOSCOPY (EGD) WITH PROPOFOL N/A 06/16/2016   Procedure: ESOPHAGOGASTRODUODENOSCOPY (EGD) WITH PROPOFOL;  Surgeon: [name], MD;  Location: [facility] ENDOSCOPY;  Service:  Endoscopy;  Laterality: N/A;  . ESOPHAGOGASTRODUODENOSCOPY (EGD) WITH PROPOFOL N/A 05/05/2017   Procedure: ESOPHAGOGASTRODUODENOSCOPY (EGD) WITH PROPOFOL;  Surgeon: Manya Silvas, MD;  Location: [facility] ENDOSCOPY;  Service: Endoscopy;  Laterality: N/A;  . ESOPHAGOGASTRODUODENOSCOPY (EGD) WITH PROPOFOL N/A 06/15/2017   Procedure: ESOPHAGOGASTRODUODENOSCOPY (EGD) WITH PROPOFOL;  Surgeon: [name], MD;  Location: [facility] ENDOSCOPY;  Service: Endoscopy;  Laterality: N/A;  . ESOPHAGOGASTRODUODENOSCOPY (EGD) WITH PROPOFOL N/A 04/27/2018   Procedure: ESOPHAGOGASTRODUODENOSCOPY (EGD) WITH PROPOFOL;  Surgeon: [name], MD;  Location: [facility] ENDOSCOPY;  Service: Endoscopy;  Laterality: N/A;  . GIVENS CAPSULE STUDY N/A 05/07/2016   Procedure: GIVENS CAPSULE STUDY;  Surgeon: Doran Stabler, MD;  Location: [place];  Service: Endoscopy;  Laterality: N/A;  . LOWER EXTREMITY ANGIOGRAPHY Left 04/19/2018   Procedure: LOWER EXTREMITY ANGIOGRAPHY;  Surgeon: Algernon Huxley, MD;  Location: [place] CV LAB;  Service: Cardiovascular;  Laterality: Left;  Marland Kitchen MANDIBULAR HARDWARE REMOVAL N/A 07/29/2013   Procedure: REMOVAL OF ARCH BARS;  Surgeon: Theodoro Kos, DO;  Location: [place];  Service: Plastics;  Laterality: N/A;  . ORIF MANDIBULAR FRACTURE N/A 06/05/2013   Procedure: REPAIR OF MANDIBULAR FRACTURE x 2 with maxillo-mandibular fixation ;  Surgeon: Theodoro Kos, DO;  Location: [place];  Service: Plastics;  Laterality: N/A;  . PARACENTESIS    . PERIPHERAL ARTERIAL STENT GRAFT Left     Prior to Admission medications   Medication Sig Start Date End Date Taking? Authorizing Provider  acetaminophen (TYLENOL) 325 MG tablet Take 2 tablets (650 mg total) by mouth every 6 (six) hours as needed for mild pain (or Fever >/= 101). 05/07/18   Gouru, Aruna, MD  alum & mag hydroxide-simeth (MAALOX/MYLANTA) 200-200-20 MG/5ML suspension Take 30 mLs by mouth every 4 (four) hours as needed for indigestion. 05/07/18    Nicholes Mango, MD  aspirin EC 81 MG tablet Take 1 tablet (81 mg total) by mouth daily. 04/20/18 04/20/19  Epifanio Lesches, MD  budesonide-formoterol (SYMBICORT) 160-4.5 MCG/ACT inhaler Inhale 2 puffs into the lungs daily. 05/07/18   Nicholes Mango, MD  calcium acetate (PHOSLO) 667 MG capsule Take 2,001 mg by mouth 3 (three) times daily.    [provider]  docusate sodium (COLACE) 100 MG capsule Take 1 capsule (100 mg total) by mouth 2 (two) times daily as needed for mild constipation. 05/07/18   Nicholes Mango, MD  folic acid (FOLVITE) 1 MG tablet Take 1 tablet (1 mg total) by mouth daily. 10/31/17   Fritzi Mandes, MD  gabapentin (NEURONTIN) 300 MG capsule Take 300 mg by mouth 3 (three) times daily.  08/16/16   [provider]  ipratropium-albuterol (DUONEB) 0.5-2.5 (3) MG/3ML SOLN Take 3 mLs by nebulization every 6 (six) hours as needed. 05/07/18   Nicholes Mango, MD  labetalol (NORMODYNE) 100 MG tablet Take 100 mg by mouth daily.    [provider]  multivitamin (RENA-VIT) TABS tablet Take 1 tablet by mouth at bedtime. 10/31/17   Posey Pronto,  Sona, MD  Nutritional Supplements (FEEDING SUPPLEMENT, NEPRO CARB STEADY,) LIQD Take 237 mLs by mouth 2 (two) times daily between meals. 05/07/18   Nicholes Mango, MD  omeprazole (PRILOSEC) 40 MG capsule Take 1 capsule (40 mg total) by mouth 2 (two) times daily. 05/07/18   Gouru, Illene Silver, MD  oxyCODONE (OXY IR/ROXICODONE) 5 MG immediate release tablet Take 1 tablet (5 mg total) by mouth every 6 (six) hours as needed for moderate pain. 05/07/18   Gouru, Illene Silver, MD  sucralfate (CARAFATE) 1 GM/10ML suspension Take 10 mLs (1 g total) by mouth 4 (four) times daily -  with meals and at bedtime. 05/07/18   Gouru, Illene Silver, MD  tiotropium (SPIRIVA HANDIHALER) 18 MCG inhalation capsule Place 1 capsule (18 mcg total) into inhaler and inhale daily. 05/07/18 08/05/18  Nicholes Mango, MD  vitamin C (VITAMIN C) 250 MG tablet Take 1 tablet (250 mg total) by mouth 2 (two) times  daily. 04/20/18   Epifanio Lesches, MD     Allergies Patient has no known allergies.  Family History  Problem Relation Age of Onset  . Colon cancer Mother   . Cancer Father   . Cancer Sister   . Kidney disease Brother     Social History Social History   Tobacco Use  . Smoking status: Current Every Day Smoker    Packs/day: 0.15    Years: 40.00    Pack years: 6.00    Types: Cigarettes  . Smokeless tobacco: Never Used  Substance Use Topics  . Alcohol use: No    Comment: pt reports quitting after learning about cirrhosis  . Drug use: No    Frequency: 7.0 times per week    Types: Marijuana, Cocaine    Review of Systems  Constitutional: No fever/chills, feels weak Eyes: No visual changes.  ENT: No sore throat. Cardiovascular: Denies chest pain. Respiratory: As above. Gastrointestinal: No abdominal pain.  Genitourinary: Negative for dysuria. Musculoskeletal: Negative for back pain. Skin: Negative for rash. Neurological: Negative for headaches    ____________________________________________   PHYSICAL EXAM:  VITAL SIGNS: ED Triage Vitals  Enc Vitals Group     BP 06/05/18 0854 (!) 137/93     Pulse Rate 06/05/18 0854 93     Resp 06/05/18 0854 19     Temp 06/05/18 0854 98.3 F (36.8 C)     Temp Source 06/05/18 0854 Oral     SpO2 06/05/18 0854 100 %     Weight 06/05/18 0855 75 kg (165 lb 5.5 oz)     Height 06/05/18 0855 1.905 m (6\' 3" )     Head Circumference --      Peak Flow --      Pain Score 06/05/18 0855 9     Pain Loc --      Pain Edu? --      Excl. in [place]? --     Constitutional: Alert and oriented. No acute distress. Pleasant and interactive   Nose: No congestion/rhinnorhea. Mouth/Throat: Mucous membranes are moist.    Cardiovascular: Normal rate, regular rhythm. Grossly normal heart sounds.  Good peripheral circulation. Respiratory: Normal respiratory effort.  No retractions.  Bibasilar Rales Gastrointestinal: Soft and nontender. No  distention.   Musculoskeletal: No lower extremity tenderness nor edema.  Warm and well perfused Neurologic:  Normal speech and language. No gross focal neurologic deficits are appreciated.  Skin:  Skin is warm, dry and intact. No rash noted. Psychiatric: Mood and affect are normal. Speech and behavior are normal.  ____________________________________________  LABS (all labs ordered are listed, but only abnormal results are displayed)  Labs Reviewed  CBC - Abnormal; Notable for the following components:      Result Value   RBC 3.28 (*)    Hemoglobin 7.7 (*)    HCT 28.2 (*)    MCH 23.5 (*)    MCHC 27.3 (*)    RDW 17.1 (*)    All other components within normal limits  COMPREHENSIVE METABOLIC PANEL - Abnormal; Notable for the following components:   Potassium 7.3 (*)    Chloride 97 (*)    BUN 36 (*)    Creatinine, Ser 8.05 (*)    Calcium 8.7 (*)    Albumin 2.7 (*)    Alkaline Phosphatase 145 (*)    GFR calc non Af Amer 7 (*)    GFR calc Af Amer 8 (*)    All other components within normal limits   ____________________________________________  EKG  ED ECG REPORT I, Lavonia Drafts, the attending physician, personally viewed and interpreted this ECG.  Date: 06/05/2018  Rate:91 Rhythm: normal sinus rhythm QRS Axis: normal Intervals: Mild interventricular conduction delay ST/T Wave abnormalities: normal Narrative Interpretation: No evidence of ischemia  ____________________________________________  RADIOLOGY  X-ray demonstrates interstitial edema ____________________________________________   PROCEDURES  Procedure(s) performed: No  Procedures   Critical Care performed: yes  CRITICAL CARE Performed by: Lavonia Drafts   Total critical care time: 30 minutes  Critical care time was exclusive of separately billable procedures and treating other patients.  Critical care was necessary to treat or prevent imminent or life-threatening deterioration.  Critical  care was time spent personally by me on the following activities: development of treatment plan with patient and/or surrogate as well as nursing, discussions with consultants, evaluation of patient's response to treatment, examination of patient, obtaining history from patient or surrogate, ordering and performing treatments and interventions, ordering and review of laboratory studies, ordering and review of radiographic studies, pulse oximetry and re-evaluation of patient's condition.  ____________________________________________   INITIAL IMPRESSION / ASSESSMENT AND PLAN / ED COURSE  Pertinent labs & imaging results that were available during my care of the patient were reviewed by me and considered in my medical decision making (see chart for details).  Patient presents with missed dialysis and shortness of breath, rales on exam concerning for pulmonary edema.  Vital signs are stable, EKG is reassuring, however he is at high risk for hyperkalemia, pending labs.  Patient's potassium is critically elevated at 7.3, discussed with Dr. Juleen China of nephrology who will arrange for emergent dialysis    ____________________________________________   FINAL CLINICAL IMPRESSION(S) / ED DIAGNOSES  Final diagnoses:  Acute pulmonary edema ([place])  Acute hyperkalemia        Note:  This document was prepared using Dragon voice recognition software and may include unintentional dictation errors.   Lavonia Drafts, MD 06/05/18 1009

## 2018-06-05 NOTE — ED Notes (Signed)
Pt discharged home after verbalizing understanding of discharge instructions; nad noted. 

## 2018-06-05 NOTE — ED Notes (Addendum)
Pt placed in surgical mask, 2L [place] and taken to dialysis via wheelchair by this RN.

## 2018-06-05 NOTE — ED Notes (Signed)
Lab reports pt K is 7.3. EDP made aware.

## 2018-06-05 NOTE — ED Provider Notes (Signed)
Patient has returned from dialysis, he feels well, no shortness of breath.  Dr. Juleen China recommends discharge, outpatient follow-up   Lavonia Drafts, MD 06/05/18 [phone]

## 2018-06-05 NOTE — Progress Notes (Signed)
Pre HD Tx   06/05/18 1012  Hand-Off documentation  Report given to (Full Name) Trellis Paganini RN  Report received from (Full Name) Gaye Alken RN  Vital Signs  Temp 98.7 F (37.1 C)  Temp Source Oral  Pulse Rate 97  Pulse Rate Source Monitor  Resp 14  BP 128/89  BP Location Right Arm  BP Method Automatic  Patient Position (if appropriate) Sitting  Oxygen Therapy  SpO2 100 %  O2 Device Nasal Cannula  O2 Flow Rate (L/min) 3 L/min  Pain Assessment  Pain Scale 0-10  Pain Score 10  Pain Type Acute pain  Pain Location Foot  Pain Orientation Left  Pain Intervention(s) Medication (See eMAR)  Dialysis Weight  Weight 75 kg  Type of Weight Pre-Dialysis  Time-Out for Hemodialysis  What Procedure? Hemodialysis  Pt Identifiers(min of two) First/Last Name;MRN/Account#  Correct Site? Yes  Correct Side? Yes  Correct Procedure? Yes  Consents Verified? Yes  Rad Studies Available? N/A  Safety Precautions Reviewed? Yes  Engineer, civil (consulting) Number 5  Station Number 3  UF/Alarm Test Passed  Conductivity: Meter 14  Conductivity: Machine  14  pH 7.4  Reverse Osmosis Main  Normal Saline Lot Number [id number]  Dialyzer Lot Number [id number]  Disposable Set Lot Number [id number]  Machine Temperature 98.6 F (37 C)  Musician and Audible Yes  Blood Lines Intact and Secured Yes  Pre Treatment Patient Checks  Vascular access used during treatment Catheter  Patient is receiving dialysis in a chair Yes  Hepatitis B Surface Antigen Results Negative  Date Hepatitis B Surface Antigen Drawn 06/17/18  Hepatitis B Surface Antibody  (>10)  Date Hepatitis B Surface Antibody Drawn 06/17/18  Hemodialysis Consent Verified Yes  Hemodialysis Standing Orders Initiated Yes  ECG (Telemetry) Monitor On Yes  Prime Ordered Normal Saline  Length of  DialysisTreatment -hour(s) 3.5 Hour(s)  Dialyzer Elisio 17H NR  Dialysate 2K, 2.5 Ca  Dialysate Flow Ordered 800  Blood Flow Rate Ordered 800 mL/min   Ultrafiltration Goal 4 Liters  Dialysis Blood Pressure Support Ordered Normal Saline  Education / Care Plan  Dialysis Education Provided Yes  Documented Education in Care Plan Yes  Hemodialysis Catheter Right Internal jugular Double-lumen  No Placement Date or Time found.   Placed prior to admission: Yes  Orientation: Right  Access Location: Internal jugular  Hemodialysis Catheter Type: Double-lumen  Site Condition No complications  Blue Lumen Status Capped (Central line)  Red Lumen Status Capped (Central line)  Dressing Type Biopatch  Dressing Status Clean;Dry;Intact  Interventions New dressing;Dressing changed  Drainage Description None

## 2018-06-05 NOTE — Progress Notes (Signed)
Pre HD Assessment   06/05/18 1015  Neurological  Level of Consciousness Alert  Orientation Level Oriented X4  Respiratory  Respiratory Pattern Regular;Labored  Bilateral Breath Sounds Diminished  Cough Non-productive  Cardiac  Pulse Regular  ECG Monitor Yes  Cardiac Rhythm ST  Ectopy Multifocal PVC's  Ectopy Frequency Frequent  Vascular  R Radial Pulse +2  L Radial Pulse +2  Edema Generalized  Integumentary  Integumentary (WDL) X  Skin Condition Dry;Itching  Itching intervention see MAR  Musculoskeletal  Musculoskeletal (WDL) X  Generalized Weakness Yes (Pt reports feeling "weaker than normal", MD aware)  Gastrointestinal  Bowel Sounds Assessment  (Reports loss of appetite MD aware)  Psychosocial  Psychosocial (WDL) WDL

## 2018-06-05 NOTE — ED Notes (Signed)
Pt remains off the floor in Dialysis.

## 2018-06-05 NOTE — Progress Notes (Signed)
Post HD Assessment  4000 mL removed during dialysis today; pt still c/o itching after benadryl, although pain (in left foot) improved after medication admin (see MAR). Pt still c/o overwhelming generalized muscle weakness and lack of appetite.    06/05/18 1430  Neurological  Level of Consciousness Alert  Orientation Level Oriented X4  Respiratory  Respiratory Pattern Regular  Bilateral Breath Sounds Diminished  Cough Non-productive  Cardiac  Pulse Regular  ECG Monitor Yes  Cardiac Rhythm ST  Ectopy Multifocal PVC's  Ectopy Frequency Frequent  Vascular  R Radial Pulse +2  L Radial Pulse +2  Edema Generalized  Integumentary  Integumentary (WDL) X  Skin Condition Dry;Itching  Itching intervention see MAR  Musculoskeletal  Musculoskeletal (WDL) X  Generalized Weakness Yes (Pt reports feeling "weaker than normal", MD aware)  Gastrointestinal  Bowel Sounds Assessment  (Reports loss of appetite MD aware)  Psychosocial  Psychosocial (WDL) WDL

## 2018-06-05 NOTE — Progress Notes (Signed)
HD Tx Start   06/05/18 1030  Vital Signs  Pulse Rate 98  Resp 16  BP 128/90  Oxygen Therapy  SpO2 100 %  During Hemodialysis Assessment  Blood Flow Rate (mL/min) 400 mL/min  Arterial Pressure (mmHg) -190 mmHg  Venous Pressure (mmHg) 180 mmHg  Transmembrane Pressure (mmHg) 60 mmHg  Ultrafiltration Rate (mL/min) 1170 mL/min (1132mL per HOUR)  Dialysate Flow Rate (mL/min) 800 ml/min  Conductivity: Machine  14  HD Safety Checks Performed Yes  Dialysis Fluid Bolus Normal Saline  Bolus Amount (mL) 250 mL  Intra-Hemodialysis Comments Tx initiated

## 2018-06-05 NOTE — ED Notes (Signed)
Pt refused vitals 

## 2018-06-05 NOTE — ED Notes (Signed)
Report received from [place], [place]. Pt off the unit in dialysis at this time.

## 2018-06-05 NOTE — ED Triage Notes (Signed)
Pt to ER via [facility] from home c/o SOB, nausea. Pt last had dialysis on Saturday. Pt alert and oriented X4, active, cooperative, pt in NAD. RR even and unlabored, color WNL.  Pt arrives on 3L [place]. Reports nonproductive cough.

## 2018-06-05 NOTE — Progress Notes (Signed)
[facility]  ROUNDING NOTE   Subjective:   Came to ED with hyperkalemia. Complains of poor appetite, left foot claudication.   Last hemodialysis was 3/21.   Objective:  Vital signs in last 24 hours:  Temp:  [98.3 F (36.8 C)-98.7 F (37.1 C)] 98.7 F (37.1 C) (03/24 1012) Pulse Rate:  [93-100] 100 (03/24 1100) Resp:  [14-20] 20 (03/24 1100) BP: (127-137)/(88-93) 131/90 (03/24 1100) SpO2:  [100 %] 100 % (03/24 1100) Weight:  [75 kg] 75 kg (03/24 1012)  Weight change:  Filed Weights   06/05/18 0855 06/05/18 1012  Weight: 75 kg 75 kg    Intake/Output: No intake/output data recorded.   Intake/Output this shift:  No intake/output data recorded.  Physical Exam: General: NAD,   Head: Normocephalic, atraumatic. Moist oral mucosal membranes  Eyes: Anicteric, PERRL  Neck: Supple, trachea midline  Lungs:  Clear to auscultation  Heart: Regular rate and rhythm  Abdomen:  Soft, nontender, +ascites  Extremities: no peripheral edema.  Neurologic: Nonfocal, moving all four extremities  Skin: No lesions  Access: RIJ permcath    Basic Metabolic Panel: Recent Labs  Lab 06/05/18 0905  NA 139  K 7.3*  CL 97*  CO2 31  GLUCOSE 89  BUN 36*  CREATININE 8.05*  CALCIUM 8.7*    Liver Function Tests: Recent Labs  Lab 06/05/18 0905  AST 15  ALT 9  ALKPHOS 145*  BILITOT 0.8  PROT 7.9  ALBUMIN 2.7*   No results for input(s): LIPASE, AMYLASE in the last 168 hours. No results for input(s): AMMONIA in the last 168 hours.  CBC: Recent Labs  Lab 06/05/18 0905  WBC 7.3  HGB 7.7*  HCT 28.2*  MCV 86.0  PLT 367    Cardiac Enzymes: No results for input(s): CKTOTAL, CKMB, CKMBINDEX, TROPONINI in the last 168 hours.  BNP: Invalid input(s): POCBNP  CBG: No results for input(s): GLUCAP in the last 168 hours.  Microbiology: Results for orders placed or performed during the hospital encounter of 04/26/18  MRSA PCR Screening     Status: None   Collection  Time: 04/26/18  1:09 PM  Result Value Ref Range Status   MRSA by PCR NEGATIVE NEGATIVE Final    Comment:        The GeneXpert MRSA Assay (FDA approved for NASAL specimens only), is one component of a comprehensive MRSA colonization surveillance program. It is not intended to diagnose MRSA infection nor to guide or monitor treatment for MRSA infections. Performed at [facility], [address] [place], [place] [address]     Coagulation Studies: No results for input(s): LABPROT, INR in the last 72 hours.  Urinalysis: No results for input(s): COLORURINE, LABSPEC, PHURINE, GLUCOSEU, HGBUR, BILIRUBINUR, KETONESUR, PROTEINUR, UROBILINOGEN, NITRITE, LEUKOCYTESUR in the last 72 hours.  Invalid input(s): APPERANCEUR    Imaging: Dg Chest Port 1 View  Result Date: 06/05/2018 CLINICAL DATA:  Shortness of breath and nausea.  Renal failure EXAM: PORTABLE CHEST 1 VIEW COMPARISON:  May 27, 2018 FINDINGS: Central catheter tip is in the superior vena cava. No pneumothorax. There is cardiomegaly with pulmonary venous hypertension. There is slight bibasilar interstitial edema. No airspace consolidation or pleural effusion. There is aortic atherosclerosis. Patient is status post coronary artery bypass grafting. No adenopathy. No bone lesions. IMPRESSION: Pulmonary vascular congestion with slight bibasilar interstitial edema. No airspace consolidation. Central catheter tip in superior vena cava. No pneumothorax. No adenopathy. Aortic Atherosclerosis (ICD10-I70.0). Electronically Signed   By: Lowella Grip III  M.D.   On: 06/05/2018 09:42     Medications:    . Chlorhexidine Gluconate Cloth  6 each Topical Q0600  .  morphine injection  2 mg Intravenous Once     Assessment/ Plan:  [name] is a 58 y.o. black male withend stage renal disease on hemodialysis secondary to Alport's syndrome, end stage liver disease with ascites, hypertension, peripheral vascular disease,  coronary artery disease, hyperlipidemia, gastrointestinal AVMs, pulmonary hypertension  [facility])RIJ permcath.210 min,currently no transportation to dialysis unit  1. Hyperkalemia: potassium 7.3  - placed on hemodialysis treatment. 2K bath  2. End Stage Renal Disease: on hemodialysis. Placed on hemodialysis treatment.   3. Anemia of chronic kidney disease:  - EPO with HD treatment.   4. Secondary Hyperparathyroidism: phosphorus at goal last week.   5. Ascites: no indication for paracentesis today.    LOS: 0 Stanley [name] 3/24/202011:08 AM

## 2018-06-07 ENCOUNTER — Encounter: Payer: Self-pay | Admitting: *Deleted

## 2018-06-07 ENCOUNTER — Other Ambulatory Visit: Payer: Self-pay

## 2018-06-07 ENCOUNTER — Non-Acute Institutional Stay
Admission: EM | Admit: 2018-06-07 | Discharge: 2018-06-07 | Disposition: A | Discharge status: Home / Self Care | Payer: Medicare Other | Attending: Nephrology | Admitting: Nephrology

## 2018-06-07 DIAGNOSIS — I132 Hypertensive heart and chronic kidney disease with heart failure and with stage 5 chronic kidney disease, or end stage renal disease: Secondary | ICD-10-CM | POA: Diagnosis not present

## 2018-06-07 DIAGNOSIS — Z951 Presence of aortocoronary bypass graft: Secondary | ICD-10-CM | POA: Diagnosis not present

## 2018-06-07 DIAGNOSIS — K7010 Alcoholic hepatitis without ascites: Secondary | ICD-10-CM | POA: Insufficient documentation

## 2018-06-07 DIAGNOSIS — E785 Hyperlipidemia, unspecified: Secondary | ICD-10-CM | POA: Diagnosis not present

## 2018-06-07 DIAGNOSIS — D631 Anemia in chronic kidney disease: Secondary | ICD-10-CM | POA: Insufficient documentation

## 2018-06-07 DIAGNOSIS — N2581 Secondary hyperparathyroidism of renal origin: Secondary | ICD-10-CM | POA: Diagnosis not present

## 2018-06-07 DIAGNOSIS — N186 End stage renal disease: Secondary | ICD-10-CM | POA: Insufficient documentation

## 2018-06-07 DIAGNOSIS — I739 Peripheral vascular disease, unspecified: Secondary | ICD-10-CM | POA: Diagnosis not present

## 2018-06-07 DIAGNOSIS — I2510 Atherosclerotic heart disease of native coronary artery without angina pectoris: Secondary | ICD-10-CM | POA: Diagnosis not present

## 2018-06-07 DIAGNOSIS — Z7951 Long term (current) use of inhaled steroids: Secondary | ICD-10-CM | POA: Insufficient documentation

## 2018-06-07 DIAGNOSIS — I272 Pulmonary hypertension, unspecified: Secondary | ICD-10-CM | POA: Insufficient documentation

## 2018-06-07 DIAGNOSIS — Z7982 Long term (current) use of aspirin: Secondary | ICD-10-CM | POA: Diagnosis not present

## 2018-06-07 DIAGNOSIS — K7460 Unspecified cirrhosis of liver: Secondary | ICD-10-CM | POA: Insufficient documentation

## 2018-06-07 DIAGNOSIS — J449 Chronic obstructive pulmonary disease, unspecified: Secondary | ICD-10-CM | POA: Insufficient documentation

## 2018-06-07 DIAGNOSIS — K7011 Alcoholic hepatitis with ascites: Secondary | ICD-10-CM | POA: Diagnosis not present

## 2018-06-07 DIAGNOSIS — K210 Gastro-esophageal reflux disease with esophagitis: Secondary | ICD-10-CM | POA: Diagnosis not present

## 2018-06-07 DIAGNOSIS — Q8781 Alport syndrome: Secondary | ICD-10-CM | POA: Insufficient documentation

## 2018-06-07 DIAGNOSIS — Z22322 Carrier or suspected carrier of Methicillin resistant Staphylococcus aureus: Secondary | ICD-10-CM | POA: Diagnosis not present

## 2018-06-07 DIAGNOSIS — F1010 Alcohol abuse, uncomplicated: Secondary | ICD-10-CM | POA: Insufficient documentation

## 2018-06-07 DIAGNOSIS — E875 Hyperkalemia: Secondary | ICD-10-CM | POA: Insufficient documentation

## 2018-06-07 DIAGNOSIS — F17210 Nicotine dependence, cigarettes, uncomplicated: Secondary | ICD-10-CM | POA: Insufficient documentation

## 2018-06-07 DIAGNOSIS — I509 Heart failure, unspecified: Secondary | ICD-10-CM | POA: Insufficient documentation

## 2018-06-07 DIAGNOSIS — Z79899 Other long term (current) drug therapy: Secondary | ICD-10-CM | POA: Diagnosis not present

## 2018-06-07 DIAGNOSIS — Z992 Dependence on renal dialysis: Secondary | ICD-10-CM | POA: Diagnosis not present

## 2018-06-07 LAB — RENAL FUNCTION PANEL
Albumin: 2.7 g/dL — ABNORMAL LOW (ref 3.5–5.0)
Anion gap: 10 (ref 5–15)
BUN: 25 mg/dL — ABNORMAL HIGH (ref 6–20)
CO2: 30 mmol/L (ref 22–32)
Calcium: 8.7 mg/dL — ABNORMAL LOW (ref 8.9–10.3)
Chloride: 97 mmol/L — ABNORMAL LOW (ref 98–111)
Creatinine, Ser: 5.43 mg/dL — ABNORMAL HIGH (ref 0.61–1.24)
GFR calc Af Amer: 12 mL/min — ABNORMAL LOW (ref >60)
GFR calc non Af Amer: 11 mL/min — ABNORMAL LOW (ref >60)
Glucose, Bld: 102 mg/dL — ABNORMAL HIGH (ref 70–99)
POTASSIUM: 5 mmol/L (ref 3.5–5.1)
Phosphorus: 2.5 mg/dL (ref 2.5–4.6)
Sodium: 137 mmol/L (ref 135–145)

## 2018-06-07 LAB — CBC
HCT: 24.4 % — ABNORMAL LOW (ref 39.0–52.0)
Hemoglobin: 6.7 g/dL — ABNORMAL LOW (ref 13.0–17.0)
MCH: 23.5 pg — AB (ref 26.0–34.0)
MCHC: 27.5 g/dL — ABNORMAL LOW (ref 30.0–36.0)
MCV: 85.6 fL (ref 80.0–100.0)
Platelets: 356 10*3/uL (ref 150–400)
RBC: 2.85 MIL/uL — AB (ref 4.22–5.81)
RDW: 16.7 % — ABNORMAL HIGH (ref 11.5–15.5)
WBC: 6.7 10*3/uL (ref 4.0–10.5)
nRBC: 0 % (ref 0.0–0.2)

## 2018-06-07 MED ORDER — DIPHENHYDRAMINE HCL 50 MG/ML IJ SOLN
25.0000 mg | Freq: Once | INTRAMUSCULAR | Status: AC
  Administered 2018-06-07: 25 mg via INTRAVENOUS

## 2018-06-07 MED ORDER — EPOETIN ALFA 10000 UNIT/ML IJ SOLN
10000.0000 [IU] | INTRAMUSCULAR | Status: DC
  Administered 2018-06-07: 10000 [IU] via INTRAVENOUS

## 2018-06-07 MED ORDER — MORPHINE SULFATE (PF) 2 MG/ML IV SOLN
2.0000 mg | Freq: Once | INTRAVENOUS | Status: AC
  Administered 2018-06-07: 2 mg via INTRAVENOUS
  Filled 2018-06-07 (×3): qty 1

## 2018-06-07 NOTE — Progress Notes (Signed)
HD Tx End   06/07/18 1815  Vital Signs  Temp 98 F (36.7 C)  Temp Source Oral  Pulse Rate 98  Pulse Rate Source Monitor  Resp 18  BP 120/82  BP Location Left Arm  BP Method Automatic  Patient Position (if appropriate) Sitting  Oxygen Therapy  SpO2 100 %  O2 Device Room Air  Pain Assessment  Pain Scale 0-10  Pain Score 0  During Hemodialysis Assessment  Blood Flow Rate (mL/min) 200 mL/min  Arterial Pressure (mmHg) -100 mmHg  Venous Pressure (mmHg) 90 mmHg  Transmembrane Pressure (mmHg) 50 mmHg  Ultrafiltration Rate (mL/min) 860 mL/min  Dialysate Flow Rate (mL/min) 600 ml/min  Conductivity: Machine  14  HD Safety Checks Performed Yes  KECN 70.9 KECN  Dialysis Fluid Bolus Normal Saline  Bolus Amount (mL) 250 mL  Intra-Hemodialysis Comments Tolerated well;Tx completed  Hemodialysis Catheter Right Internal jugular Double-lumen  No Placement Date or Time found.   Placed prior to admission: Yes  Orientation: Right  Access Location: Internal jugular  Hemodialysis Catheter Type: Double-lumen  Site Condition No complications  Blue Lumen Status Flushed;Saline locked;Capped (Central line);Heparin locked  Red Lumen Status Flushed;Saline locked;Capped (Central line);Heparin locked  Purple Lumen Status N/A  Catheter fill solution Heparin 1000 units/ml  Catheter fill volume (Arterial) 1.7 cc  Catheter fill volume (Venous) 1.7  Dressing Type Biopatch  Dressing Status Clean;Dry;Intact;Dressing changed;Antimicrobial disc changed  Interventions New dressing;Dressing changed  Drainage Description None  Post treatment catheter status Capped and Clamped

## 2018-06-07 NOTE — Progress Notes (Signed)
Pre HD Assessment   06/07/18 1435  Neurological  Level of Consciousness Alert  Orientation Level Oriented X4  Respiratory  Respiratory Pattern Regular  Bilateral Breath Sounds Diminished  Cough Non-productive  Cardiac  Pulse Regular  ECG Monitor Yes  Cardiac Rhythm ST  Ectopy Multifocal PVC's  Ectopy Frequency Frequent  Vascular  R Radial Pulse +2  L Radial Pulse +2  Edema Generalized  Integumentary  Integumentary (WDL) X  Skin Condition Dry;Itching  Itching intervention see MAR  Musculoskeletal  Musculoskeletal (WDL) X  Generalized Weakness Yes (Pt reports feeling "weaker than normal", MD aware)  Gastrointestinal  Bowel Sounds Assessment  (Reports loss of appetite MD aware)  Psychosocial  Psychosocial (WDL) X  Patient Behaviors Anxious;Agitated;Irritable (agitated/shouting about COVID ED restrictions,butcooperates)  Needs Expressed Physical  Emotional support given Given to patient

## 2018-06-07 NOTE — Progress Notes (Signed)
[facility]  ROUNDING NOTE   Subjective:   Came to ED with hyperkalemia.   Placed on hemodialysis treatment.     HEMODIALYSIS FLOWSHEET:  Blood Flow Rate (mL/min): 400 mL/min Arterial Pressure (mmHg): -160 mmHg Venous Pressure (mmHg): 160 mmHg Transmembrane Pressure (mmHg): 50 mmHg Ultrafiltration Rate (mL/min): 860 mL/min Dialysate Flow Rate (mL/min): 600 ml/min Conductivity: Machine : 14 Conductivity: Machine : 14 Dialysis Fluid Bolus: Normal Saline Bolus Amount (mL): 250 mL    Objective:  Vital signs in last 24 hours:  Temp:  [97.9 F (36.6 C)-98 F (36.7 C)] 98 F (36.7 C) (03/26 1410) Pulse Rate:  [86-97] 97 (03/26 1600) Resp:  [14-27] 25 (03/26 1600) BP: (107-119)/(62-88) 118/73 (03/26 1600) SpO2:  [100 %] 100 % (03/26 1600) Weight:  [74.8 kg] 74.8 kg (03/26 1410)  Weight change:  Filed Weights   06/07/18 1354 06/07/18 1410  Weight: 74.8 kg 74.8 kg    Intake/Output: No intake/output data recorded.   Intake/Output this shift:  No intake/output data recorded.  Physical Exam: General: NAD,   Head: Normocephalic, atraumatic. Moist oral mucosal membranes  Eyes: Anicteric, PERRL  Neck: Supple, trachea midline  Lungs:  Clear to auscultation  Heart: Regular rate and rhythm  Abdomen:  Soft, nontender, +ascites  Extremities: no peripheral edema.  Neurologic: Nonfocal, moving all four extremities  Skin: No lesions  Access: RIJ permcath    Basic Metabolic Panel: Recent Labs  Lab 06/05/18 0905  NA 139  K 7.3*  CL 97*  CO2 31  GLUCOSE 89  BUN 36*  CREATININE 8.05*  CALCIUM 8.7*    Liver Function Tests: Recent Labs  Lab 06/05/18 0905  AST 15  ALT 9  ALKPHOS 145*  BILITOT 0.8  PROT 7.9  ALBUMIN 2.7*   No results for input(s): LIPASE, AMYLASE in the last 168 hours. No results for input(s): AMMONIA in the last 168 hours.  CBC: Recent Labs  Lab 06/05/18 0905  WBC 7.3  HGB 7.7*  HCT 28.2*  MCV 86.0  PLT 367     Cardiac Enzymes: No results for input(s): CKTOTAL, CKMB, CKMBINDEX, TROPONINI in the last 168 hours.  BNP: Invalid input(s): POCBNP  CBG: No results for input(s): GLUCAP in the last 168 hours.  Microbiology: Results for orders placed or performed during the hospital encounter of 04/26/18  MRSA PCR Screening     Status: None   Collection Time: 04/26/18  1:09 PM  Result Value Ref Range Status   MRSA by PCR NEGATIVE NEGATIVE Final    Comment:        The GeneXpert MRSA Assay (FDA approved for NASAL specimens only), is one component of a comprehensive MRSA colonization surveillance program. It is not intended to diagnose MRSA infection nor to guide or monitor treatment for MRSA infections. Performed at [facility], [address] [place], [place] [address]     Coagulation Studies: No results for input(s): LABPROT, INR in the last 72 hours.  Urinalysis: No results for input(s): COLORURINE, LABSPEC, PHURINE, GLUCOSEU, HGBUR, BILIRUBINUR, KETONESUR, PROTEINUR, UROBILINOGEN, NITRITE, LEUKOCYTESUR in the last 72 hours.  Invalid input(s): APPERANCEUR    Imaging: No results found.   Medications:    . epoetin (EPOGEN/PROCRIT) injection  10,000 Units Intravenous Q T,Th,Sa-HD     Assessment/ Plan:  [name] is a 58 y.o. black male withend stage renal disease on hemodialysis secondary to Alport's syndrome, end stage liver disease with ascites, hypertension, peripheral vascular disease, coronary artery disease, hyperlipidemia, gastrointestinal AVMs,  pulmonary hypertension  [facility])RIJ permcath.210 min,currently no transportation to dialysis unit  1. Hyperkalemia:   - placed on hemodialysis treatment. 2K bath  2. End Stage Renal Disease: on hemodialysis. Placed on hemodialysis treatment.   3. Anemia of chronic kidney disease: hemoglobin 7.7 - EPO with HD treatment.   4. Secondary Hyperparathyroidism: phosphorus at  goal last week.   5. Ascites: no indication for paracentesis today.    LOS: 0 Stanley [name] 3/26/20204:13 PM

## 2018-06-07 NOTE — Progress Notes (Signed)
Pre HD Tx   06/07/18 1410  Hand-Off documentation  Report given to (Full Name) ED RN D  Report received from (Full Name) Trellis Paganini RN   Vital Signs  Temp 98 F (36.7 C)  Temp Source Oral  Pulse Rate 94  Pulse Rate Source Monitor  Resp 14  BP 108/72  BP Location Left Arm  BP Method Automatic  Patient Position (if appropriate) Sitting  Oxygen Therapy  SpO2 100 %  O2 Device Room Air  Pain Assessment  Pain Scale 0-10  Pain Score 10  Pain Type Surgical pain;Other (Comment)  Pain Location Foot  Pain Orientation Left  Pain Radiating Towards  (up left leg / left foot from vascular surgery)  Pain Descriptors / Indicators Constant  Pain Intervention(s) MD notified (Comment);Repositioned  Dialysis Weight  Weight 74.8 kg  Type of Weight Pre-Dialysis  Time-Out for Hemodialysis  What Procedure? Hemodialysis  Pt Identifiers(min of two) First/Last Name;MRN/Account#  Correct Site? Yes  Correct Side? Yes  Correct Procedure? Yes  Consents Verified? Yes  Rad Studies Available? N/A  Safety Precautions Reviewed? Yes  Engineer, civil (consulting) Number 5  Station Number 3  UF/Alarm Test Passed  Conductivity: Meter 14  Conductivity: Machine  14  pH 7.4  Reverse Osmosis Main  Normal Saline Lot Number [id number]  Dialyzer Lot Number [id number]  Disposable Set Lot Number [id number]  Machine Temperature 98.6 F (37 C)  Musician and Audible Yes  Blood Lines Intact and Secured Yes  Pre Treatment Patient Checks  Vascular access used during treatment Catheter  Patient is receiving dialysis in a chair Yes  Hepatitis B Surface Antigen Results Negative  Date Hepatitis B Surface Antigen Drawn 06/02/18  Hepatitis B Surface Antibody  (>10)  Date Hepatitis B Surface Antibody Drawn 06/02/18  Hemodialysis Consent Verified Yes  Hemodialysis Standing Orders Initiated Yes  ECG (Telemetry) Monitor On Yes  Prime Ordered Normal Saline  Length of  DialysisTreatment -hour(s) 3.5 Hour(s)  Dialysis  Treatment Comments Na 140  Dialyzer Elisio 17H NR  Dialysate Flow Ordered 600  Blood Flow Rate Ordered 400 mL/min  Ultrafiltration Goal 2.5 Liters  Pre Treatment Labs Renal panel;CBC  Dialysis Blood Pressure Support Ordered Normal Saline  Education / Care Plan  Dialysis Education Provided Yes  Documented Education in Care Plan Yes  Hemodialysis Catheter Right Internal jugular Double-lumen  No Placement Date or Time found.   Placed prior to admission: Yes  Orientation: Right  Access Location: Internal jugular  Hemodialysis Catheter Type: Double-lumen  Site Condition No complications  Blue Lumen Status Capped (Central line)  Red Lumen Status Capped (Central line)  Purple Lumen Status N/A  Dressing Type Biopatch  Dressing Status Clean;Dry;Intact  Drainage Description None  Dressing Change Due 06/09/18

## 2018-06-07 NOTE — ED Provider Notes (Signed)
[facility] Emergency Department Provider Note  ____________________________________________  Time seen: Approximately 6:29 PM  I have reviewed the triage vital signs and the nursing notes.   HISTORY  Chief Complaint Vascular Access Problem    HPI Stanley [name] is a 58 y.o. male patient return to ED after having hemodialysis at the hospital today.  Denies any symptoms, no chest pain shortness of breath or palpitations or dizziness.  Currently eating a can of Pringles.  Denies any abdominal pain or recent illness.      Past Medical History:  Diagnosis Date  . Alcohol abuse   . CHF (congestive heart failure) ([place])   . Cirrhosis ([place])   . Coronary artery disease 2009  . Drug abuse ([place])   . End stage renal disease on dialysis [facility]) NEPHROLOGIST-   DR [facility]  IN [place]   HEMODIALYSIS --   TUES/  THURS/  SAT  . Gastrointestinal bleed 06/13/2017   From chart...hx of multiple GI bleeds  . GERD (gastroesophageal reflux disease)   . Hyperlipidemia   . Hypertension   . PAD (peripheral artery disease) ([place])   . Renal insufficiency    Per pt, 32 oz fluid restriction per day  . S/P triple vessel bypass 06/09/2016   2009ish  . Suicidal ideation    & HOMICIDAL IDEATION --  06-16-2013   ADMITTED TO BEHAVIOR HEALTH     Patient Active Problem List   Diagnosis Date Noted  . PAD (peripheral artery disease) ([place]) 04/14/2018  . Healthcare-associated pneumonia 01/15/2018  . Acute on chronic respiratory failure ([place]) 01/15/2018  . Acute pulmonary edema ([place]) 12/23/2017  . Acute respiratory failure ([place]) 10/29/2017  . ESRD (end stage renal disease) on dialysis ([place]) 07/28/2017  . Protein-calorie malnutrition, severe 06/14/2017  . Encounter for dialysis [facility])   . Palliative care by specialist   . Goals of care, counseling/discussion   . Malnutrition of moderate degree 06/05/2017  . Secondary esophageal varices without bleeding ([place])   . Stomach  irritation   . Idiopathic esophageal varices without bleeding ([place])   . Alcoholic hepatitis with ascites 05/24/2017  . ESRD (end stage renal disease) ([place]) 04/28/2017  . Uremia 03/08/2017  . ESRD on hemodialysis ([place]) 03/03/2017  . Weakness 02/28/2017  . Hypocalcemia 02/22/2017  . Shortness of breath 11/26/2016  . COPD (chronic obstructive pulmonary disease) ([place]) 10/30/2016  . COPD exacerbation ([place]) 10/29/2016  . Anemia   . Heme positive stool   . Ulceration of intestine   . Benign neoplasm of transverse colon   . Acute gastrointestinal hemorrhage   . Esophageal candidiasis ([place])   . Angiodysplasia of intestinal tract   . Acute respiratory failure with hypoxia ([place]) 07/03/2016  . GI bleeding 06/24/2016  . Rectal bleeding 06/14/2016  . Anemia of chronic disease 06/01/2016  . MRSA carrier 06/01/2016  . Chronic renal failure 05/23/2016  . Ischemic heart disease 05/23/2016  . Angiodysplasia of small intestine   . Melena   . Small bowel bleed not requiring more than 4 units of blood in 24 hours, ICU, or surgery   . Anemia due to chronic blood loss   . Abdominal pain 05/05/2016  . Acute posthemorrhagic anemia 04/17/2016  . Gastrointestinal bleed 04/17/2016  . History of esophagogastroduodenoscopy (EGD) 04/17/2016  . Elevated troponin 04/17/2016  . Alcohol abuse 04/17/2016  . Upper GI bleed 01/19/2016  . Blood in stool   . Angiodysplasia of stomach and duodenum with hemorrhage   . Gastritis   . Reflux  esophagitis   . GI bleed 05/16/2015  . Acute GI bleeding   . Symptomatic anemia 04/30/2015  . HTN (hypertension) 04/06/2015  . GERD (gastroesophageal reflux disease) 04/06/2015  . HLD (hyperlipidemia) 04/06/2015  . Dyspnea 04/06/2015  . Cirrhosis of liver with ascites ([place]) 04/06/2015  . Ascites 04/06/2015  . GIB (gastrointestinal bleeding) 03/23/2015  . Homicidal ideation 06/19/2013  . Suicidal intent 06/19/2013  . Homicidal ideations 06/19/2013  . Hyperkalemia  06/16/2013  . Mandible fracture ([place]) 06/05/2013  . Fracture, mandible ([place]) 06/02/2013  . Coronary atherosclerosis of native coronary artery 06/02/2013  . ESRD on dialysis ([name]) 06/02/2013  . Mandible open fracture ([place]) 06/02/2013     Past Surgical History:  Procedure Laterality Date  . A/V FISTULAGRAM Right 06/06/2017   Procedure: A/V FISTULAGRAM;  Surgeon: Katha Cabal, MD;  Location: [place] CV LAB;  Service: Cardiovascular;  Laterality: Right;  . A/V SHUNT INTERVENTION N/A 06/06/2017   Procedure: A/V SHUNT INTERVENTION;  Surgeon: Katha Cabal, MD;  Location: [place] CV LAB;  Service: Cardiovascular;  Laterality: N/A;  . AGILE CAPSULE N/A 06/19/2016   Procedure: AGILE CAPSULE;  Surgeon: [name], MD;  Location: [facility] ENDOSCOPY;  Service: Endoscopy;  Laterality: N/A;  . COLONOSCOPY WITH PROPOFOL N/A 06/18/2016   Procedure: COLONOSCOPY WITH PROPOFOL;  Surgeon: [name], MD;  Location: [facility] ENDOSCOPY;  Service: Endoscopy;  Laterality: N/A;  . COLONOSCOPY WITH PROPOFOL N/A 08/12/2016   Procedure: COLONOSCOPY WITH PROPOFOL;  Surgeon: [name], MD;  Location: [facility] ENDOSCOPY;  Service: Endoscopy;  Laterality: N/A;  . COLONOSCOPY WITH PROPOFOL N/A 05/05/2017   Procedure: COLONOSCOPY WITH PROPOFOL;  Surgeon: Manya Silvas, MD;  Location: [facility] ENDOSCOPY;  Service: Endoscopy;  Laterality: N/A;  . CORONARY ANGIOPLASTY  ?   PT UNABLE TO TELL IF  BEFORE OR AFTER  CABG  . CORONARY ARTERY BYPASS GRAFT  2008  ([place])   3 VESSEL  . DIALYSIS FISTULA CREATION  LAST SURGERY  APPOX  2008  . ENTEROSCOPY N/A 05/10/2016   Procedure: ENTEROSCOPY;  Surgeon: Jerene Bears, MD;  Location: [place];  Service: Gastroenterology;  Laterality: N/A;  . ENTEROSCOPY N/A 08/12/2016   Procedure: ENTEROSCOPY;  Surgeon: [name], MD;  Location: [facility] ENDOSCOPY;  Service: Endoscopy;  Laterality: N/A;  . ENTEROSCOPY Left 06/03/2017   Procedure: ENTEROSCOPY;  Surgeon: Virgel Manifold, MD;  Location: [facility] ENDOSCOPY;  Service: Endoscopy;  Laterality: Left;  Procedure date will ultimately depend on when patient is medically optimized before the procedure, pending hemodialysis and blood transfusions etc. Will place on schedule and change depending on clinical status.   . ENTEROSCOPY N/A 06/05/2017   Procedure: ENTEROSCOPY;  Surgeon: Virgel Manifold, MD;  Location: [facility] ENDOSCOPY;  Service: Endoscopy;  Laterality: N/A;  . ENTEROSCOPY N/A 06/15/2017   Procedure: Push ENTEROSCOPY;  Surgeon: [name], MD;  Location[facility] END[facility]COPY;  Service: Endoscopy;  Laterality: N/A;  . ESOPHAGOGASTRODUODENOSCOPY N/A 05/07/2015   Procedure: ESOPHAGOGASTRODUODENOSCOPY (EGD);  Surgeon: Hulen Luster, MD;  Location: [facility] END[facility]COPY;  Service: Endoscopy;  Laterality: N/A;  . ESOPHAGOGASTRODUODENOSCOPY (EGD) WITH PROPOFOL N/A 05/17/2015   Procedure: ESOPHAGOGASTRODUODENOSCOPY (EGD) WITH PROPOFOL;  Surgeon: [name], MD;  Location: [facility] ENDOSCOPY;  Service: Endoscopy;  Laterality: N/A;  . ESOPHAGOGASTRODUODENOSCOPY (EGD) WITH PROPOFOL N/A 01/20/2016   Procedure: ESOPHAGOGASTRODUODENOSCOPY (EGD) WITH PROPOFOL;  Surgeon: [name], MD;  Location: [facility] ENDOSCOPY;  Service: Endoscopy;  Laterality: N/A;  . ESOPHAGOGASTRODUODENOSCOPY (EGD) WITH PROPOFOL N/A 04/17/2016   Procedure: ESOPHAGOGASTRODUODENOSCOPY (EGD) WITH PROPOFOL;  Surgeon: Lin Landsman, MD;  Location: [facility] ENDOSCOPY;  Service: Gastroenterology;  Laterality: N/A;  . ESOPHAGOGASTRODUODENOSCOPY (EGD) WITH PROPOFOL  05/09/2016   Procedure: ESOPHAGOGASTRODUODENOSCOPY (EGD) WITH PROPOFOL;  Surgeon: Jerene Bears, MD;  Location: [place];  Service: Endoscopy;;  . ESOPHAGOGASTRODUODENOSCOPY (EGD) WITH PROPOFOL N/A 06/16/2016   Procedure: ESOPHAGOGASTRODUODENOSCOPY (EGD) WITH PROPOFOL;  Surgeon: [name], MD;  Location: [facility] ENDOSCOPY;  Service: Endoscopy;  Laterality: N/A;  . ESOPHAGOGASTRODUODENOSCOPY (EGD) WITH PROPOFOL N/A 05/05/2017    Procedure: ESOPHAGOGASTRODUODENOSCOPY (EGD) WITH PROPOFOL;  Surgeon: Manya Silvas, MD;  Location: [facility] ENDOSCOPY;  Service: Endoscopy;  Laterality: N/A;  . ESOPHAGOGASTRODUODENOSCOPY (EGD) WITH PROPOFOL N/A 06/15/2017   Procedure: ESOPHAGOGASTRODUODENOSCOPY (EGD) WITH PROPOFOL;  Surgeon: [name], MD;  Location: [facility] ENDOSCOPY;  Service: Endoscopy;  Laterality: N/A;  . ESOPHAGOGASTRODUODENOSCOPY (EGD) WITH PROPOFOL N/A 04/27/2018   Procedure: ESOPHAGOGASTRODUODENOSCOPY (EGD) WITH PROPOFOL;  Surgeon: [name], MD;  Location: [facility] ENDOSCOPY;  Service: Endoscopy;  Laterality: N/A;  . GIVENS CAPSULE STUDY N/A 05/07/2016   Procedure: GIVENS CAPSULE STUDY;  Surgeon: Doran Stabler, MD;  Location: [place];  Service: Endoscopy;  Laterality: N/A;  . LOWER EXTREMITY ANGIOGRAPHY Left 04/19/2018   Procedure: LOWER EXTREMITY ANGIOGRAPHY;  Surgeon: Algernon Huxley, MD;  Location: [place] CV LAB;  Service: Cardiovascular;  Laterality: Left;  Marland Kitchen MANDIBULAR HARDWARE REMOVAL N/A 07/29/2013   Procedure: REMOVAL OF ARCH BARS;  Surgeon: Theodoro Kos, DO;  Location: [place];  Service: Plastics;  Laterality: N/A;  . ORIF MANDIBULAR FRACTURE N/A 06/05/2013   Procedure: REPAIR OF MANDIBULAR FRACTURE x 2 with maxillo-mandibular fixation ;  Surgeon: Theodoro Kos, DO;  Location: [place];  Service: Plastics;  Laterality: N/A;  . PARACENTESIS    . PERIPHERAL ARTERIAL STENT GRAFT Left      Prior to Admission medications   Medication Sig Start Date End Date Taking? Authorizing Provider  acetaminophen (TYLENOL) 325 MG tablet Take 2 tablets (650 mg total) by mouth every 6 (six) hours as needed for mild pain (or Fever >/= 101). 05/07/18   Gouru, Aruna, MD  alum & mag hydroxide-simeth (MAALOX/MYLANTA) 200-200-20 MG/5ML suspension Take 30 mLs by mouth every 4 (four) hours as needed for indigestion. 05/07/18   Nicholes Mango, MD  aspirin EC 81 MG tablet Take 1 tablet (81 mg total) by mouth daily.  04/20/18 04/20/19  Epifanio Lesches, MD  B Complex-C-Folic Acid (RENA-VITE PO) Take 1 tablet by mouth daily. 11/28/16   [provider]  budesonide-formoterol (SYMBICORT) 160-4.5 MCG/ACT inhaler Inhale 2 puffs into the lungs daily. 05/07/18   Nicholes Mango, MD  calcium acetate (PHOSLO) 667 MG capsule Take 2,001 mg by mouth 3 (three) times daily.    [provider]  docusate sodium (COLACE) 100 MG capsule Take 1 capsule (100 mg total) by mouth 2 (two) times daily as needed for mild constipation. 05/07/18   Nicholes Mango, MD  folic acid (FOLVITE) 1 MG tablet Take 1 tablet (1 mg total) by mouth daily. 10/31/17   Fritzi Mandes, MD  gabapentin (NEURONTIN) 300 MG capsule Take 300 mg by mouth 3 (three) times daily.  08/16/16   [provider]  ipratropium-albuterol (DUONEB) 0.5-2.5 (3) MG/3ML SOLN Take 3 mLs by nebulization every 6 (six) hours as needed. 05/07/18   Nicholes Mango, MD  labetalol (NORMODYNE) 100 MG tablet Take 100 mg by mouth daily.    [provider]  multivitamin (RENA-VIT) TABS tablet Take 1 tablet by mouth at bedtime. 10/31/17   Fritzi Mandes, MD  Nutritional Supplements (FEEDING SUPPLEMENT, NEPRO CARB STEADY,) LIQD Take 237 mLs by mouth 2 (two) times daily between meals. 05/07/18   Nicholes Mango, MD  omeprazole (PRILOSEC) 40 MG capsule Take 1 capsule (40 mg total) by mouth 2 (two) times daily. 05/07/18   Gouru, Illene Silver, MD  oxyCODONE (OXY IR/ROXICODONE) 5 MG immediate release tablet Take 1 tablet (5 mg total) by mouth every 6 (six) hours as needed for moderate pain. 05/07/18   Gouru, Illene Silver, MD  sucralfate (CARAFATE) 1 GM/10ML suspension Take 10 mLs (1 g total) by mouth 4 (four) times daily -  with meals and at bedtime. 05/07/18   Gouru, Illene Silver, MD  tiotropium (SPIRIVA HANDIHALER) 18 MCG inhalation capsule Place 1 capsule (18 mcg total) into inhaler and inhale daily. 05/07/18 08/05/18  Nicholes Mango, MD  vitamin C (VITAMIN C) 250 MG tablet Take 1 tablet (250 mg total) by mouth 2  (two) times daily. 04/20/18   Epifanio Lesches, MD     Allergies Patient has no known allergies.   Family History  Problem Relation Age of Onset  . Colon cancer Mother   . Cancer Father   . Cancer Sister   . Kidney disease Brother     Social History Social History   Tobacco Use  . Smoking status: Current Every Day Smoker    Packs/day: 0.15    Years: 40.00    Pack years: 6.00    Types: Cigarettes  . Smokeless tobacco: Never Used  Substance Use Topics  . Alcohol use: No    Comment: pt reports quitting after learning about cirrhosis  . Drug use: No    Frequency: 7.0 times per week    Types: Marijuana, Cocaine    Review of Systems  Constitutional:   No fever or chills.  ENT:   No sore throat. No rhinorrhea. Cardiovascular:   No chest pain or syncope. Respiratory:   No dyspnea or cough. Gastrointestinal:   Negative for abdominal pain, vomiting and diarrhea.  Musculoskeletal:   Negative for focal pain or swelling All other systems reviewed and are negative except as documented above in ROS and HPI.  ____________________________________________   PHYSICAL EXAM:  VITAL SIGNS: ED Triage Vitals  Enc Vitals Group     BP 06/07/18 1353 108/62     Pulse Rate 06/07/18 1353 92     Resp 06/07/18 1353 16     Temp 06/07/18 1353 97.9 F (36.6 C)     Temp Source 06/07/18 1410 Oral     SpO2 06/07/18 1353 100 %     Weight 06/07/18 1354 165 lb (74.8 kg)     Height 06/07/18 1354 6\' 3"  (1.905 m)     Head Circumference --      Peak Flow --      Pain Score 06/07/18 1353 0     Pain Loc --      Pain Edu? --      Excl. in [place]? --     Vital signs reviewed, nursing assessments reviewed.   Constitutional:   Alert and oriented. Non-toxic appearance. Eyes:   Conjunctivae are normal.  ENT      Head:   Normocephalic and atraumatic.         Neck:   No meningismus. Full ROM. Hematological/Lymphatic/Immunilogical:   No cervical lymphadenopathy. Cardiovascular:   RRR. Symmetric  bilateral radial and DP pulses.  No murmurs. Cap refill less than 2 seconds. Respiratory:   Normal respiratory effort without tachypnea/retractions. Breath sounds are clear and equal bilaterally.  No wheezes/rales/rhonchi. Gastrointestinal:   Soft and nontender.  Mildly distended. No rebound, rigidity, or guarding. Musculoskeletal:   Normal range of motion in all extremities.  No edema. Neurologic:   Normal speech and language.  Motor grossly intact. No acute focal neurologic deficits are appreciated.   ____________________________________________    LABS (pertinent positives/negatives) (all labs ordered are listed, but only abnormal results are displayed) Labs Reviewed  RENAL FUNCTION PANEL - Abnormal; Notable for the following components:      Result Value   Chloride 97 (*)    Glucose, Bld 102 (*)    BUN 25 (*)    Creatinine, Ser 5.43 (*)    Calcium 8.7 (*)    Albumin 2.7 (*)    GFR calc non Af Amer 11 (*)    GFR calc Af Amer 12 (*)    All other components within normal limits  CBC - Abnormal; Notable for the following components:   RBC 2.85 (*)    Hemoglobin 6.7 (*)    HCT 24.4 (*)    MCH 23.5 (*)    MCHC 27.5 (*)    RDW 16.7 (*)    All other components within normal limits   ____________________________________________   EKG  ____________________________________________    RADIOLOGY  No results found.  ____________________________________________   PROCEDURES Procedures  ____________________________________________  CLINICAL IMPRESSION / ASSESSMENT AND PLAN / ED COURSE  Pertinent labs & imaging results that were available during my care of the patient were reviewed by me and considered in my medical decision making (see chart for details).    Patient in baseline state of health after having routine dialysis here at the hospital.  No acute complaints, exam benign, stable for outpatient follow-up.       ____________________________________________   FINAL CLINICAL IMPRESSION(S) / ED DIAGNOSES    Final diagnoses:  [name] [facility])     ED Discharge Orders    None      Portions of this note were generated with dragon dictation software. Dictation errors may occur despite best attempts at proofreading.   Carrie Mew, MD 06/07/18 [phone]

## 2018-06-07 NOTE — Progress Notes (Signed)
Post HD Tx  2.5 L fluid removal during dialysis today.     06/07/18 1832  Neurological  Level of Consciousness Alert  Orientation Level Oriented X4  Respiratory  Respiratory Pattern Regular  Bilateral Breath Sounds Diminished  Cough Non-productive  Cardiac  Pulse Regular  ECG Monitor Yes  Cardiac Rhythm ST  Ectopy Multifocal PVC's  Ectopy Frequency Frequent  Vascular  R Radial Pulse +2  L Radial Pulse +2  Edema Generalized  Integumentary  Integumentary (WDL) X  Skin Condition Dry;Itching  Itching intervention see MAR  Musculoskeletal  Musculoskeletal (WDL) X  Generalized Weakness Yes (Pt reports feeling "weaker than normal", MD aware)  Gastrointestinal  Bowel Sounds Assessment  (Reports loss of appetite MD aware)  Psychosocial  Psychosocial (WDL) X  Patient Behaviors Anxious;Agitated;Irritable (agitated/shouting about COVID ED restrictions,butcooperates)  Needs Expressed Physical  Emotional support given Given to patient

## 2018-06-07 NOTE — Progress Notes (Signed)
HD Tx Start   06/07/18 1430  Vital Signs  Pulse Rate 86  Resp (!) 22  BP 107/71  Oxygen Therapy  SpO2 100 %  During Hemodialysis Assessment  Blood Flow Rate (mL/min) 400 mL/min  Arterial Pressure (mmHg) -180 mmHg  Venous Pressure (mmHg) 160 mmHg  Transmembrane Pressure (mmHg) 40 mmHg  Ultrafiltration Rate (mL/min) 860 mL/min  Dialysate Flow Rate (mL/min) 600 ml/min  Conductivity: Machine  14  HD Safety Checks Performed Yes  Dialysis Fluid Bolus Normal Saline  Bolus Amount (mL) 250 mL  Intra-Hemodialysis Comments Tx initiated  Hemodialysis Catheter Right Internal jugular Double-lumen  No Placement Date or Time found.   Placed prior to admission: Yes  Orientation: Right  Access Location: Internal jugular  Hemodialysis Catheter Type: Double-lumen  Site Condition No complications  Blue Lumen Status Infusing  Red Lumen Status Infusing

## 2018-06-07 NOTE — Progress Notes (Signed)
Post HD Tx   06/07/18 1820  Vital Signs  Pulse Rate 99  Resp 18  BP 121/79  Post-Hemodialysis Assessment  Rinseback Volume (mL) 250 mL  Dialyzer Clearance Lightly streaked  Duration of HD Treatment -hour(s) 3.5 hour(s)  Hemodialysis Intake (mL) 500 mL  UF Total -Machine (mL) 3000 mL  Net UF (mL) 2500 mL  Tolerated HD Treatment Yes

## 2018-06-07 NOTE — ED Triage Notes (Signed)
Pt is here for dialysis.  Pt states that he had his last treatment on Tuesday.  NAD.  No CP, no sob.

## 2018-06-11 ENCOUNTER — Other Ambulatory Visit: Payer: Self-pay

## 2018-06-11 ENCOUNTER — Encounter: Payer: Self-pay | Admitting: Emergency Medicine

## 2018-06-11 ENCOUNTER — Non-Acute Institutional Stay
Admission: EM | Admit: 2018-06-11 | Discharge: 2018-06-11 | Disposition: A | Discharge status: Home / Self Care | Payer: Medicare Other | Attending: Emergency Medicine | Admitting: Emergency Medicine

## 2018-06-11 DIAGNOSIS — E785 Hyperlipidemia, unspecified: Secondary | ICD-10-CM | POA: Insufficient documentation

## 2018-06-11 DIAGNOSIS — N186 End stage renal disease: Secondary | ICD-10-CM | POA: Diagnosis not present

## 2018-06-11 DIAGNOSIS — I2510 Atherosclerotic heart disease of native coronary artery without angina pectoris: Secondary | ICD-10-CM | POA: Diagnosis not present

## 2018-06-11 DIAGNOSIS — I272 Pulmonary hypertension, unspecified: Secondary | ICD-10-CM | POA: Diagnosis not present

## 2018-06-11 DIAGNOSIS — R188 Other ascites: Secondary | ICD-10-CM | POA: Insufficient documentation

## 2018-06-11 DIAGNOSIS — D631 Anemia in chronic kidney disease: Secondary | ICD-10-CM | POA: Diagnosis not present

## 2018-06-11 DIAGNOSIS — I132 Hypertensive heart and chronic kidney disease with heart failure and with stage 5 chronic kidney disease, or end stage renal disease: Secondary | ICD-10-CM | POA: Diagnosis not present

## 2018-06-11 DIAGNOSIS — I120 Hypertensive chronic kidney disease with stage 5 chronic kidney disease or end stage renal disease: Secondary | ICD-10-CM | POA: Diagnosis not present

## 2018-06-11 DIAGNOSIS — Q8781 Alport syndrome: Secondary | ICD-10-CM | POA: Diagnosis not present

## 2018-06-11 DIAGNOSIS — E875 Hyperkalemia: Secondary | ICD-10-CM | POA: Diagnosis not present

## 2018-06-11 DIAGNOSIS — I739 Peripheral vascular disease, unspecified: Secondary | ICD-10-CM | POA: Insufficient documentation

## 2018-06-11 DIAGNOSIS — Z992 Dependence on renal dialysis: Secondary | ICD-10-CM | POA: Insufficient documentation

## 2018-06-11 DIAGNOSIS — I509 Heart failure, unspecified: Secondary | ICD-10-CM | POA: Diagnosis not present

## 2018-06-11 DIAGNOSIS — N2581 Secondary hyperparathyroidism of renal origin: Secondary | ICD-10-CM | POA: Diagnosis not present

## 2018-06-11 DIAGNOSIS — K219 Gastro-esophageal reflux disease without esophagitis: Secondary | ICD-10-CM | POA: Diagnosis not present

## 2018-06-11 LAB — CBC
HCT: 23.6 % — ABNORMAL LOW (ref 39.0–52.0)
Hemoglobin: 6.5 g/dL — ABNORMAL LOW (ref 13.0–17.0)
MCH: 23.3 pg — ABNORMAL LOW (ref 26.0–34.0)
MCHC: 27.5 g/dL — ABNORMAL LOW (ref 30.0–36.0)
MCV: 84.6 fL (ref 80.0–100.0)
Platelets: 345 10*3/uL (ref 150–400)
RBC: 2.79 MIL/uL — ABNORMAL LOW (ref 4.22–5.81)
RDW: 16.6 % — AB (ref 11.5–15.5)
WBC: 6 10*3/uL (ref 4.0–10.5)
nRBC: 0 % (ref 0.0–0.2)

## 2018-06-11 LAB — RENAL FUNCTION PANEL
Albumin: 2.7 g/dL — ABNORMAL LOW (ref 3.5–5.0)
Anion gap: 8 (ref 5–15)
BUN: 38 mg/dL — ABNORMAL HIGH (ref 6–20)
CO2: 29 mmol/L (ref 22–32)
Calcium: 8.5 mg/dL — ABNORMAL LOW (ref 8.9–10.3)
Chloride: 99 mmol/L (ref 98–111)
Creatinine, Ser: 7.21 mg/dL — ABNORMAL HIGH (ref 0.61–1.24)
GFR calc Af Amer: 9 mL/min — ABNORMAL LOW (ref >60)
GFR calc non Af Amer: 8 mL/min — ABNORMAL LOW (ref >60)
Glucose, Bld: 89 mg/dL (ref 70–99)
PHOSPHORUS: 2.1 mg/dL — AB (ref 2.5–4.6)
Potassium: 6.3 mmol/L (ref 3.5–5.1)
Sodium: 136 mmol/L (ref 135–145)

## 2018-06-11 MED ORDER — CHLORHEXIDINE GLUCONATE CLOTH 2 % EX PADS: 6.0000 | MEDICATED_PAD | Freq: Every day | CUTANEOUS | Status: DC

## 2018-06-11 MED ORDER — EPOETIN ALFA 4000 UNIT/ML IJ SOLN
4000.0000 [IU] | Freq: Once | INTRAMUSCULAR | Status: AC
  Administered 2018-06-11: 4000 [IU] via INTRAVENOUS
  Filled 2018-06-11: qty 1

## 2018-06-11 MED ORDER — DIPHENHYDRAMINE HCL 50 MG/ML IJ SOLN
25.0000 mg | Freq: Once | INTRAMUSCULAR | Status: AC
  Administered 2018-06-11: 25 mg via INTRAVENOUS

## 2018-06-11 MED ORDER — MORPHINE SULFATE (PF) 2 MG/ML IV SOLN
2.0000 mg | Freq: Once | INTRAVENOUS | Status: AC
  Administered 2018-06-11: 2 mg via INTRAVENOUS
  Filled 2018-06-11 (×2): qty 1

## 2018-06-11 NOTE — Progress Notes (Signed)
HD Tx started w/o complication    06/81/66 1419  Vital Signs  Pulse Rate 86  Pulse Rate Source Monitor  Resp 13  BP 119/84  BP Location Left Arm  BP Method Automatic  Patient Position (if appropriate) Sitting  Oxygen Therapy  SpO2 100 %  O2 Device Room Air  Pulse Oximetry Type Continuous  During Hemodialysis Assessment  Blood Flow Rate (mL/min) 400 mL/min  Arterial Pressure (mmHg) -180 mmHg  Venous Pressure (mmHg) 150 mmHg  Transmembrane Pressure (mmHg) 60 mmHg  Ultrafiltration Rate (mL/min) 580 mL/min  Dialysate Flow Rate (mL/min) 600 ml/min  Conductivity: Machine  13.6  HD Safety Checks Performed Yes  Dialysis Fluid Bolus Normal Saline  Bolus Amount (mL) 250 mL  Intra-Hemodialysis Comments Tx initiated

## 2018-06-11 NOTE — ED Provider Notes (Signed)
[facility] Emergency Department Provider Note       Time seen: ----------------------------------------- 2:32 PM on 06/11/2018 -----------------------------------------   I have reviewed the triage vital signs and the nursing notes.  HISTORY   Chief Complaint Vascular Access Problem    HPI Stanley [name] is a 58 y.o. male with a history of alcohol abuse, CHF, cirrhosis, coronary disease, end-stage renal disease on dialysis, hypertension, suicidal ideation who presents to the ED for dialysis treatment as scheduled.  He denies any other complaints or concerns.  Past Medical History:  Diagnosis Date  . Alcohol abuse   . CHF (congestive heart failure) ([place])   . Cirrhosis ([place])   . Coronary artery disease 2009  . Drug abuse ([place])   . End stage renal disease on dialysis [facility]) NEPHROLOGIST-   DR [name]  IN [place]   HEMODIALYSIS --   TUES/  THURS/  SAT  . Gastrointestinal bleed 06/13/2017   From chart...hx of multiple GI bleeds  . GERD (gastroesophageal reflux disease)   . Hyperlipidemia   . Hypertension   . PAD (peripheral artery disease) ([place])   . Renal insufficiency    Per pt, 32 oz fluid restriction per day  . S/P triple vessel bypass 06/09/2016   2009ish  . Suicidal ideation    & HOMICIDAL IDEATION --  06-16-2013   ADMITTED TO BEHAVIOR HEALTH    Patient Active Problem List   Diagnosis Date Noted  . PAD (peripheral artery disease) ([place]) 04/14/2018  . Healthcare-associated pneumonia 01/15/2018  . Acute on chronic respiratory failure ([place]) 01/15/2018  . Acute pulmonary edema ([place]) 12/23/2017  . Acute respiratory failure ([place]) 10/29/2017  . ESRD (end stage renal disease) on dialysis ([place]) 07/28/2017  . Protein-calorie malnutrition, severe 06/14/2017  . Encounter for dialysis [facility])   . Palliative care by specialist   . Goals of care, counseling/discussion   . Malnutrition of moderate degree 06/05/2017  . Secondary esophageal varices  without bleeding ([place])   . Stomach irritation   . Idiopathic esophageal varices without bleeding ([place])   . Alcoholic hepatitis with ascites 05/24/2017  . ESRD (end stage renal disease) ([place]) 04/28/2017  . Uremia 03/08/2017  . ESRD on hemodialysis ([place]) 03/03/2017  . Weakness 02/28/2017  . Hypocalcemia 02/22/2017  . Shortness of breath 11/26/2016  . COPD (chronic obstructive pulmonary disease) ([place]) 10/30/2016  . COPD exacerbation ([place]) 10/29/2016  . Anemia   . Heme positive stool   . Ulceration of intestine   . Benign neoplasm of transverse colon   . Acute gastrointestinal hemorrhage   . Esophageal candidiasis ([place])   . Angiodysplasia of intestinal tract   . Acute respiratory failure with hypoxia ([place]) 07/03/2016  . GI bleeding 06/24/2016  . Rectal bleeding 06/14/2016  . Anemia of chronic disease 06/01/2016  . MRSA carrier 06/01/2016  . Chronic renal failure 05/23/2016  . Ischemic heart disease 05/23/2016  . Angiodysplasia of small intestine   . Melena   . Small bowel bleed not requiring more than 4 units of blood in 24 hours, ICU, or surgery   . Anemia due to chronic blood loss   . Abdominal pain 05/05/2016  . Acute posthemorrhagic anemia 04/17/2016  . Gastrointestinal bleed 04/17/2016  . History of esophagogastroduodenoscopy (EGD) 04/17/2016  . Elevated troponin 04/17/2016  . Alcohol abuse 04/17/2016  . Upper GI bleed 01/19/2016  . Blood in stool   . Angiodysplasia of stomach and duodenum with hemorrhage   . Gastritis   . Reflux esophagitis   .  GI bleed 05/16/2015  . Acute GI bleeding   . Symptomatic anemia 04/30/2015  . HTN (hypertension) 04/06/2015  . GERD (gastroesophageal reflux disease) 04/06/2015  . HLD (hyperlipidemia) 04/06/2015  . Dyspnea 04/06/2015  . Cirrhosis of liver with ascites ([place]) 04/06/2015  . Ascites 04/06/2015  . GIB (gastrointestinal bleeding) 03/23/2015  . Homicidal ideation 06/19/2013  . Suicidal intent 06/19/2013  . Homicidal ideations  06/19/2013  . Hyperkalemia 06/16/2013  . Mandible fracture ([place]) 06/05/2013  . Fracture, mandible ([place]) 06/02/2013  . Coronary atherosclerosis of native coronary artery 06/02/2013  . ESRD on dialysis ([place]) 06/02/2013  . Mandible open fracture ([place]) 06/02/2013    Past Surgical History:  Procedure Laterality Date  . A/V FISTULAGRAM Right 06/06/2017   Procedure: A/V FISTULAGRAM;  Surgeon: Katha Cabal, MD;  Location: [place] CV LAB;  Service: Cardiovascular;  Laterality: Right;  . A/V SHUNT INTERVENTION N/A 06/06/2017   Procedure: A/V SHUNT INTERVENTION;  Surgeon: Katha Cabal, MD;  Location: [place] [facility] LAB;  Service: Cardiovascular;  Laterality: N/A;  . AGILE CAPSULE N/A 06/19/2016   Procedure: AGILE CAPSULE;  Surgeon: [name], MD;  Location: [facility] ENDOSCOPY;  Service: Endoscopy;  Laterality: N/A;  . COLONOSCOPY WITH PROPOFOL N/A 06/18/2016   Procedure: COLONOSCOPY WITH PROPOFOL;  Surgeon: [name], MD;  Location: [facility] ENDOSCOPY;  Service: Endoscopy;  Laterality: N/A;  . COLONOSCOPY WITH PROPOFOL N/A 08/12/2016   Procedure: COLONOSCOPY WITH PROPOFOL;  Surgeon: [name], MD;  Location: [facility] ENDOSCOPY;  Service: Endoscopy;  Laterality: N/A;  . COLONOSCOPY WITH PROPOFOL N/A 05/05/2017   Procedure: COLONOSCOPY WITH PROPOFOL;  Surgeon: Manya Silvas, MD;  Location: [facility] ENDOSCOPY;  Service: Endoscopy;  Laterality: N/A;  . CORONARY ANGIOPLASTY  ?   PT UNABLE TO TELL IF  BEFORE OR AFTER  CABG  . CORONARY ARTERY BYPASS GRAFT  2008  ([place])   3 VESSEL  . DIALYSIS FISTULA CREATION  LAST SURGERY  APPOX  2008  . ENTEROSCOPY N/A 05/10/2016   Procedure: ENTEROSCOPY;  Surgeon: Jerene Bears, MD;  Location: [place];  Service: Gastroenterology;  Laterality: N/A;  . ENTEROSCOPY N/A 08/12/2016   Procedure: ENTEROSCOPY;  Surgeon: [name], MD;  Location: [facility] ENDOSCOPY;  Service: Endoscopy;  Laterality: N/A;  . ENTEROSCOPY Left 06/03/2017   Procedure: ENTEROSCOPY;   Surgeon: Virgel Manifold, MD;  Location: [facility] ENDOSCOPY;  Service: Endoscopy;  Laterality: Left;  Procedure date will ultimately depend on when patient is medically optimized before the procedure, pending hemodialysis and blood transfusions etc. Will place on schedule and change depending on clinical status.   . ENTEROSCOPY N/A 06/05/2017   Procedure: ENTEROSCOPY;  Surgeon: Virgel Manifold, MD;  Location: [facility] ENDOSCOPY;  Service: Endoscopy;  Laterality: N/A;  . ENTEROSCOPY N/A 06/15/2017   Procedure: Push ENTEROSCOPY;  Surgeon: [name], MD;  Location: [facility] ENDOSCOPY;  Service: Endoscopy;  Laterality: N/A;  . ESOPHAGOGASTRODUODENOSCOPY N/A 05/07/2015   Procedure: ESOPHAGOGASTRODUODENOSCOPY (EGD);  Surgeon: Hulen Luster, MD;  Location: [facility] ENDOSCOPY;  Service: Endoscopy;  Laterality: N/A;  . ESOPHAGOGASTRODUODENOSCOPY (EGD) WITH PROPOFOL N/A 05/17/2015   Procedure: ESOPHAGOGASTRODUODENOSCOPY (EGD) WITH PROPOFOL;  Surgeon: [name], MD;  Location: [facility] ENDOSCOPY;  Service: Endoscopy;  Laterality: N/A;  . ESOPHAGOGASTRODUODENOSCOPY (EGD) WITH PROPOFOL N/A 01/20/2016   Procedure: ESOPHAGOGASTRODUODENOSCOPY (EGD) WITH PROPOFOL;  Surgeon: [name], MD;  Location: [facility] ENDOSCOPY;  Service: Endoscopy;  Laterality: N/A;  . ESOPHAGOGASTRODUODENOSCOPY (EGD) WITH PROPOFOL N/A 04/17/2016   Procedure: ESOPHAGOGASTRODUODENOSCOPY (EGD) WITH PROPOFOL;  Surgeon: Lin Landsman,  MD;  Location: [facility] ENDOSCOPY;  Service: Gastroenterology;  Laterality: N/A;  . ESOPHAGOGASTRODUODENOSCOPY (EGD) WITH PROPOFOL  05/09/2016   Procedure: ESOPHAGOGASTRODUODENOSCOPY (EGD) WITH PROPOFOL;  Surgeon: Jerene Bears, MD;  Location: [place];  Service: Endoscopy;;  . ESOPHAGOGASTRODUODENOSCOPY (EGD) WITH PROPOFOL N/A 06/16/2016   Procedure: ESOPHAGOGASTRODUODENOSCOPY (EGD) WITH PROPOFOL;  Surgeon: [name], MD;  Location: [facility] ENDOSCOPY;  Service: Endoscopy;  Laterality: N/A;  . ESOPHAGOGASTRODUODENOSCOPY (EGD) WITH  PROPOFOL N/A 05/05/2017   Procedure: ESOPHAGOGASTRODUODENOSCOPY (EGD) WITH PROPOFOL;  Surgeon: Manya Silvas, MD;  Location: [facility] ENDOSCOPY;  Service: Endoscopy;  Laterality: N/A;  . ESOPHAGOGASTRODUODENOSCOPY (EGD) WITH PROPOFOL N/A 06/15/2017   Procedure: ESOPHAGOGASTRODUODENOSCOPY (EGD) WITH PROPOFOL;  Surgeon: [name], MD;  Location: [facility] ENDOSCOPY;  Service: Endoscopy;  Laterality: N/A;  . ESOPHAGOGASTRODUODENOSCOPY (EGD) WITH PROPOFOL N/A 04/27/2018   Procedure: ESOPHAGOGASTRODUODENOSCOPY (EGD) WITH PROPOFOL;  Surgeon: [name], MD;  Location: [facility] ENDOSCOPY;  Service: Endoscopy;  Laterality: N/A;  . GIVENS CAPSULE STUDY N/A 05/07/2016   Procedure: GIVENS CAPSULE STUDY;  Surgeon: Doran Stabler, MD;  Location: [place];  Service: Endoscopy;  Laterality: N/A;  . LOWER EXTREMITY ANGIOGRAPHY Left 04/19/2018   Procedure: LOWER EXTREMITY ANGIOGRAPHY;  Surgeon: Algernon Huxley, MD;  Location: [place] [facility] LAB;  Service: Cardiovascular;  Laterality: Left;  Marland Kitchen MANDIBULAR HARDWARE REMOVAL N/A 07/29/2013   Procedure: REMOVAL OF ARCH BARS;  Surgeon: Theodoro Kos, DO;  Location: [place];  Service: Plastics;  Laterality: N/A;  . ORIF MANDIBULAR FRACTURE N/A 06/05/2013   Procedure: REPAIR OF MANDIBULAR FRACTURE x 2 with maxillo-mandibular fixation ;  Surgeon: Theodoro Kos, DO;  Location: [place];  Service: Plastics;  Laterality: N/A;  . PARACENTESIS    . PERIPHERAL ARTERIAL STENT GRAFT Left     Allergies Patient has no known allergies.  Social History Social History   Tobacco Use  . Smoking status: Current Every Day Smoker    Packs/day: 0.15    Years: 40.00    Pack years: 6.00    Types: Cigarettes  . Smokeless tobacco: Never Used  Substance Use Topics  . Alcohol use: No    Comment: pt reports quitting after learning about cirrhosis  . Drug use: No    Frequency: 7.0 times per week    Types: Marijuana, Cocaine   Review of Systems Constitutional: Negative for  fever. Cardiovascular: Negative for chest pain. Respiratory: Negative for shortness of breath. Gastrointestinal: Negative for abdominal pain, vomiting and diarrhea. Musculoskeletal: Negative for back pain. Skin: Negative for rash. Neurological: Negative for headaches, focal weakness or numbness.  All systems negative/normal/unremarkable except as stated in the HPI  ____________________________________________   PHYSICAL EXAM:  VITAL SIGNS: ED Triage Vitals  Enc Vitals Group     BP 06/11/18 1326 118/71     Pulse Rate 06/11/18 1326 89     Resp 06/11/18 1326 20     Temp 06/11/18 1326 97.7 F (36.5 C)     Temp Source 06/11/18 1326 Oral     SpO2 06/11/18 1326 100 %     Weight 06/11/18 1327 167 lb (75.8 kg)     Height 06/11/18 1327 6\' 3"  (1.905 m)     Head Circumference --      Peak Flow --      Pain Score --      Pain Loc --      Pain Edu? --      Excl. in [place]? --     Constitutional: Alert and oriented. Well appearing and  in no distress. Eyes: Conjunctivae are normal. Normal extraocular movements. Cardiovascular: Normal rate, regular rhythm. No murmurs, rubs, or gallops. Respiratory: Normal respiratory effort without tachypnea nor retractions. Breath sounds are clear and equal bilaterally. No wheezes/rales/rhonchi. Gastrointestinal: Soft and nontender. Normal bowel sounds Musculoskeletal: Nontender with normal range of motion in extremities. No lower extremity tenderness nor edema. Neurologic:  Normal speech and language. No gross focal neurologic deficits are appreciated.  Skin:  Skin is warm, dry and intact. No rash noted. Psychiatric: Mood and affect are normal. Speech and behavior are normal.  ____________________________________________  ED COURSE:  As part of my medical decision making, I reviewed the following data within the [place] History obtained from family if available, nursing notes, old chart and ekg, as well as notes from prior ED  visits. Patient presented for dialysis, we will discuss with nephrology. Procedures Clarnce Homan was evaluated in Emergency Department on 06/11/2018 for the symptoms described in the history of present illness. He was evaluated in the context of the global COVID-19 pandemic, which necessitated consideration that the patient might be at risk for infection with the SARS-CoV-2 virus that causes COVID-19. Institutional protocols and algorithms that pertain to the evaluation of patients at risk for COVID-19 are in a state of rapid change based on information released by regulatory bodies including the CDC and federal and state organizations. These policies and algorithms were followed during the patient's care in the ED.   ____________________________________________   DIFFERENTIAL DIAGNOSIS   End-stage renal disease on dialysis, medication noncompliance, hyperkalemia  FINAL ASSESSMENT AND PLAN  End-stage renal disease on dialysis   Plan: The patient had presented for scheduled dialysis treatment.  He appears medically clear for dialysis at this time.  Patient has been seen by nephrology.   Laurence Aly, MD    Note: This note was generated in part or whole with voice recognition software. Voice recognition is usually quite accurate but there are transcription errors that can and very often do occur. I apologize for any typographical errors that were not detected and corrected.     Earleen Newport, MD 06/11/18 [phone]

## 2018-06-11 NOTE — ED Notes (Signed)
Patient cleared by MD for dialysis. No other complaints at this time.

## 2018-06-11 NOTE — Progress Notes (Signed)
[facility]  ROUNDING NOTE   Subjective:   Presents to ER for evaluation of uremia and to undergo hemodialysis  Denies any acute shortness of breath, fever, cough    Objective:  Vital signs in last 24 hours:  Temp:  [97.7 F (36.5 C)] 97.7 F (36.5 C) (03/30 1326) Pulse Rate:  [89] 89 (03/30 1326) Resp:  [20] 20 (03/30 1326) BP: (118)/(71) 118/71 (03/30 1326) SpO2:  [100 %] 100 % (03/30 1326) Weight:  [75.8 kg] 75.8 kg (03/30 1327)  Weight change:  Filed Weights   06/11/18 1327  Weight: 75.8 kg    Intake/Output: No intake/output data recorded.   Intake/Output this shift:  No intake/output data recorded.  Physical Exam: General: NAD,   Head: Normocephalic, atraumatic. Moist oral mucosal membranes  Eyes: Anicteric,  Neck: Supple, trachea midline  Lungs:  Clear to auscultation  Heart: Regular rate and rhythm  Abdomen:  Soft, nontender, +ascites  Extremities: no peripheral edema.  Neurologic: Nonfocal, moving all four extremities  Skin: No lesions  Access: RIJ permcath    Basic Metabolic Panel: Recent Labs  Lab 06/05/18 0905 06/07/18 1451  NA 139 137  K 7.3* 5.0  CL 97* 97*  CO2 31 30  GLUCOSE 89 102*  BUN 36* 25*  CREATININE 8.05* 5.43*  CALCIUM 8.7* 8.7*  PHOS  --  2.5    Liver Function Tests: Recent Labs  Lab 06/05/18 0905 06/07/18 1451  AST 15  --   ALT 9  --   ALKPHOS 145*  --   BILITOT 0.8  --   PROT 7.9  --   ALBUMIN 2.7* 2.7*   No results for input(s): LIPASE, AMYLASE in the last 168 hours. No results for input(s): AMMONIA in the last 168 hours.  CBC: Recent Labs  Lab 06/05/18 0905 06/07/18 1451  WBC 7.3 6.7  HGB 7.7* 6.7*  HCT 28.2* 24.4*  MCV 86.0 85.6  PLT 367 356    Cardiac Enzymes: No results for input(s): CKTOTAL, CKMB, CKMBINDEX, TROPONINI in the last 168 hours.  BNP: Invalid input(s): POCBNP  CBG: No results for input(s): GLUCAP in the last 168 hours.  Microbiology: Results for orders placed  or performed during the hospital encounter of 04/26/18  MRSA PCR Screening     Status: None   Collection Time: 04/26/18  1:09 PM  Result Value Ref Range Status   MRSA by PCR NEGATIVE NEGATIVE Final    Comment:        The GeneXpert MRSA Assay (FDA approved for NASAL specimens only), is one component of a comprehensive MRSA colonization surveillance program. It is not intended to diagnose MRSA infection nor to guide or monitor treatment for MRSA infections. Performed at [facility], [address] [place], [place] [address]     Coagulation Studies: No results for input(s): LABPROT, INR in the last 72 hours.  Urinalysis: No results for input(s): COLORURINE, LABSPEC, PHURINE, GLUCOSEU, HGBUR, BILIRUBINUR, KETONESUR, PROTEINUR, UROBILINOGEN, NITRITE, LEUKOCYTESUR in the last 72 hours.  Invalid input(s): APPERANCEUR    Imaging: No results found.   Medications:       Assessment/ Plan:  [name] is a 58 y.o. black male withend stage renal disease on hemodialysis secondary to Alport's syndrome, end stage liver disease with ascites, hypertension, peripheral vascular disease, coronary artery disease, hyperlipidemia, gastrointestinal AVMs, pulmonary hypertension  [facility])RIJ permcath.210 min,currently no transportation to dialysis unit  1. Hyperkalemia:   - placed on hemodialysis treatment. 2K bath -Please  check renal panel today  2. End Stage Renal Disease: on hemodialysis.  Placed on hemodialysis treatment.   3. Anemia of chronic kidney disease: hemoglobin 6.7 - EPO with HD treatment.   4. Secondary Hyperparathyroidism:  phosphorus at goal  5. Ascites:  no indication for paracentesis today.    LOS: 0 Danyella Mcginty 3/30/20201:43 PM

## 2018-06-11 NOTE — Progress Notes (Signed)
Pre HD Tx   06/11/18 1415  Vital Signs  Temp 97.6 F (36.4 C)  Temp Source Oral  Pulse Rate 88  Pulse Rate Source Monitor  Resp 20  BP 122/82  BP Location Left Arm  BP Method Automatic  Patient Position (if appropriate) Sitting  Oxygen Therapy  SpO2 100 %  O2 Device Room Air  Pulse Oximetry Type Continuous  Pain Assessment  Pain Scale 0-10  Pain Score 8  Pain Type Neuropathic pain  Pain Location Foot  Pain Orientation Left  Pain Intervention(s) MD notified (Comment);Medication (See eMAR)  Dialysis Weight  Weight 75.7 kg  Type of Weight Pre-Dialysis  Time-Out for Hemodialysis  What Procedure? HD  Pt Identifiers(min of two) First/Last Name;MRN/Account#  Correct Site? Yes  Correct Side? Yes  Correct Procedure? Yes  Consents Verified? Yes  Rad Studies Available? N/A  Safety Precautions Reviewed? Yes  Engineer, civil (consulting) Number 5  Station Number 3  UF/Alarm Test Passed  Conductivity: Meter 13.6  Conductivity: Machine  13.6  pH 7.4  Reverse Osmosis Main  Normal Saline Lot Number [id number]  Dialyzer Lot Number [id number]  Disposable Set Lot Number [id number]  Machine Temperature 98.6 F (37 C)  Musician and Audible Yes  Blood Lines Intact and Secured Yes  Pre Treatment Patient Checks  Vascular access used during treatment Catheter  Patient is receiving dialysis in a chair Yes  Hepatitis B Surface Antigen Results Negative  Date Hepatitis B Surface Antigen Drawn 06/17/18  Hepatitis B Surface Antibody  (>10)  Date Hepatitis B Surface Antibody Drawn 06/17/18  Hemodialysis Consent Verified Yes  Hemodialysis Standing Orders Initiated Yes  ECG (Telemetry) Monitor On Yes  Prime Ordered Normal Saline  Length of  DialysisTreatment -hour(s) 3.5 Hour(s)  Dialysis Treatment Comments Na 140  Dialyzer Elisio 17H NR  Dialysate 2K, 2.5 Ca  Dialysis Anticoagulant None  Dialysate Flow Ordered 600  Blood Flow Rate Ordered 400 mL/min  Ultrafiltration Goal 1.5 Liters   Pre Treatment Labs CBC;Renal panel  Dialysis Blood Pressure Support Ordered Normal Saline  Education / Care Plan  Dialysis Education Provided Yes  Documented Education in Care Plan Yes  Hemodialysis Catheter Right Internal jugular Double-lumen  No Placement Date or Time found.   Placed prior to admission: Yes  Orientation: Right  Access Location: Internal jugular  Hemodialysis Catheter Type: Double-lumen  Site Condition No complications  Blue Lumen Status Flushed  Red Lumen Status Flushed  Purple Lumen Status N/A  Dressing Status Clean;Dry;Intact  Interventions New dressing;Dressing changed  Dressing Change Due 06/18/18

## 2018-06-11 NOTE — ED Triage Notes (Signed)
Pt here for dialysis treatment. Denies issues or concerns.

## 2018-06-12 NOTE — Progress Notes (Signed)
Post HD TX

## 2018-06-12 NOTE — Progress Notes (Signed)
HD tx completed    06/11/18 1715  Vital Signs  Pulse Rate 89  Pulse Rate Source Monitor  Resp (!) 26  BP 121/65  BP Location Right Wrist  BP Method Automatic  Patient Position (if appropriate) Sitting  Oxygen Therapy  SpO2 100 %  O2 Device Room Air  Pulse Oximetry Type Continuous  During Hemodialysis Assessment  Blood Flow Rate (mL/min) 400 mL/min  Arterial Pressure (mmHg) -180 mmHg  Venous Pressure (mmHg) 150 mmHg  Transmembrane Pressure (mmHg) 60 mmHg  Ultrafiltration Rate (mL/min) 580 mL/min  Dialysate Flow Rate (mL/min) 600 ml/min  Conductivity: Machine  13.8  HD Safety Checks Performed Yes  Intra-Hemodialysis Comments Tx completed;Tolerated well (Pt request to end tx, unbearable itcing despite medical inte)

## 2018-06-13 ENCOUNTER — Non-Acute Institutional Stay
Admission: EM | Admit: 2018-06-13 | Discharge: 2018-06-13 | Disposition: A | Discharge status: Home / Self Care | Payer: Medicare Other | Attending: Emergency Medicine | Admitting: Emergency Medicine

## 2018-06-13 ENCOUNTER — Encounter: Payer: Self-pay | Admitting: Emergency Medicine

## 2018-06-13 ENCOUNTER — Other Ambulatory Visit: Payer: Self-pay

## 2018-06-13 DIAGNOSIS — R188 Other ascites: Secondary | ICD-10-CM | POA: Insufficient documentation

## 2018-06-13 DIAGNOSIS — D631 Anemia in chronic kidney disease: Secondary | ICD-10-CM | POA: Insufficient documentation

## 2018-06-13 DIAGNOSIS — I739 Peripheral vascular disease, unspecified: Secondary | ICD-10-CM | POA: Insufficient documentation

## 2018-06-13 DIAGNOSIS — I272 Pulmonary hypertension, unspecified: Secondary | ICD-10-CM | POA: Diagnosis not present

## 2018-06-13 DIAGNOSIS — E785 Hyperlipidemia, unspecified: Secondary | ICD-10-CM | POA: Insufficient documentation

## 2018-06-13 DIAGNOSIS — I2510 Atherosclerotic heart disease of native coronary artery without angina pectoris: Secondary | ICD-10-CM | POA: Diagnosis not present

## 2018-06-13 DIAGNOSIS — N2581 Secondary hyperparathyroidism of renal origin: Secondary | ICD-10-CM | POA: Diagnosis not present

## 2018-06-13 DIAGNOSIS — Z992 Dependence on renal dialysis: Secondary | ICD-10-CM | POA: Diagnosis not present

## 2018-06-13 DIAGNOSIS — I120 Hypertensive chronic kidney disease with stage 5 chronic kidney disease or end stage renal disease: Secondary | ICD-10-CM | POA: Diagnosis not present

## 2018-06-13 DIAGNOSIS — K7290 Hepatic failure, unspecified without coma: Secondary | ICD-10-CM | POA: Insufficient documentation

## 2018-06-13 DIAGNOSIS — Q8781 Alport syndrome: Secondary | ICD-10-CM | POA: Diagnosis not present

## 2018-06-13 DIAGNOSIS — E875 Hyperkalemia: Secondary | ICD-10-CM | POA: Insufficient documentation

## 2018-06-13 DIAGNOSIS — N186 End stage renal disease: Secondary | ICD-10-CM | POA: Diagnosis not present

## 2018-06-13 DIAGNOSIS — Z4901 Encounter for fitting and adjustment of extracorporeal dialysis catheter: Secondary | ICD-10-CM | POA: Diagnosis not present

## 2018-06-13 LAB — HEPATITIS B SURFACE ANTIGEN: Hepatitis B Surface Ag: NEGATIVE

## 2018-06-13 LAB — CBC
HCT: 23.8 % — ABNORMAL LOW (ref 39.0–52.0)
Hemoglobin: 6.5 g/dL — ABNORMAL LOW (ref 13.0–17.0)
MCH: 22.8 pg — ABNORMAL LOW (ref 26.0–34.0)
MCHC: 27.3 g/dL — ABNORMAL LOW (ref 30.0–36.0)
MCV: 83.5 fL (ref 80.0–100.0)
Platelets: 367 10*3/uL (ref 150–400)
RBC: 2.85 MIL/uL — ABNORMAL LOW (ref 4.22–5.81)
RDW: 16.6 % — ABNORMAL HIGH (ref 11.5–15.5)
WBC: 5.7 10*3/uL (ref 4.0–10.5)
nRBC: 0 % (ref 0.0–0.2)

## 2018-06-13 LAB — BASIC METABOLIC PANEL
Anion gap: 8 (ref 5–15)
BUN: 38 mg/dL — ABNORMAL HIGH (ref 6–20)
CO2: 28 mmol/L (ref 22–32)
Calcium: 8.6 mg/dL — ABNORMAL LOW (ref 8.9–10.3)
Chloride: 100 mmol/L (ref 98–111)
Creatinine, Ser: 7.05 mg/dL — ABNORMAL HIGH (ref 0.61–1.24)
GFR calc Af Amer: 9 mL/min — ABNORMAL LOW (ref >60)
GFR calc non Af Amer: 8 mL/min — ABNORMAL LOW (ref >60)
Glucose, Bld: 117 mg/dL — ABNORMAL HIGH (ref 70–99)
Potassium: 6.3 mmol/L (ref 3.5–5.1)
Sodium: 136 mmol/L (ref 135–145)

## 2018-06-13 LAB — HEPATITIS B SURFACE ANTIBODY, QUANTITATIVE: Hep B S AB Quant (Post): 50.5 m[IU]/mL (ref >9.9)

## 2018-06-13 MED ORDER — DIPHENHYDRAMINE HCL 50 MG/ML IJ SOLN
25.0000 mg | Freq: Four times a day (QID) | INTRAMUSCULAR | Status: DC | PRN
  Administered 2018-06-13: 25 mg via INTRAVENOUS
  Filled 2018-06-13: qty 0.5

## 2018-06-13 MED ORDER — EPOETIN ALFA 10000 UNIT/ML IJ SOLN
4000.0000 [IU] | Freq: Once | INTRAMUSCULAR | Status: AC
  Administered 2018-06-13: 4000 [IU] via INTRAVENOUS

## 2018-06-13 MED ORDER — MORPHINE SULFATE (PF) 2 MG/ML IV SOLN
2.0000 mg | Freq: Once | INTRAVENOUS | Status: AC
  Administered 2018-06-13: 2 mg via INTRAVENOUS
  Filled 2018-06-13: qty 1

## 2018-06-13 MED ORDER — CHLORHEXIDINE GLUCONATE CLOTH 2 % EX PADS
6.0000 | MEDICATED_PAD | Freq: Every day | CUTANEOUS | Status: DC
  Administered 2018-06-13: 6 via TOPICAL
  Filled 2018-06-13: qty 6

## 2018-06-13 NOTE — Progress Notes (Signed)
HD Tx inititated   06/13/18 1130  Vital Signs  Pulse Rate 85  Pulse Rate Source Monitor  Resp 11  BP 121/86  BP Location Left Arm  BP Method Automatic  Patient Position (if appropriate) Sitting  Oxygen Therapy  SpO2 100 %  O2 Device Room Therapist, nutritional Number 3  Station Number 3  UF/Alarm Test Passed  Conductivity: Meter 13.8  Conductivity: Machine  13.8  pH 7.4  Reverse Osmosis Main  During Hemodialysis Assessment  Blood Flow Rate (mL/min) 400 mL/min  Arterial Pressure (mmHg) -180 mmHg  Venous Pressure (mmHg) 160 mmHg  Transmembrane Pressure (mmHg) 60 mmHg  Ultrafiltration Rate (mL/min) 670 mL/min  Dialysate Flow Rate (mL/min) 600 ml/min  Conductivity: Machine  13.7  HD Safety Checks Performed Yes  Dialysis Fluid Bolus Normal Saline  Bolus Amount (mL) 250 mL  Intra-Hemodialysis Comments Tx initiated

## 2018-06-13 NOTE — Progress Notes (Signed)
HD Tx completed    06/13/18 1438  Vital Signs  Pulse Rate 99  Pulse Rate Source Monitor  Resp (!) 30  BP 120/78  BP Location Left Arm  BP Method Automatic  Patient Position (if appropriate) Sitting  Oxygen Therapy  SpO2 100 %  O2 Device Room Air  During Hemodialysis Assessment  HD Safety Checks Performed Yes  KECN 69.4 KECN  Dialysis Fluid Bolus Normal Saline  Bolus Amount (mL) 250 mL  Intra-Hemodialysis Comments Tx completed;Tolerated well

## 2018-06-13 NOTE — ED Provider Notes (Signed)
[facility] Emergency Department Provider Note  ____________________________________________   First MD Initiated Contact with Patient 06/13/18 1100     (approximate)  I have reviewed the triage vital signs and the nursing notes.   HISTORY  Chief Complaint Dialysis    HPI Stanley [name] is a 59 y.o. male presents emergency department in need of dialysis.  Patient is well-known to this ER and comes here for his regularly scheduled dialysis.  He denies any change in his health.  He has no complaints today.    Past Medical History:  Diagnosis Date  . Alcohol abuse   . CHF (congestive heart failure) ([place])   . Cirrhosis ([place])   . Coronary artery disease 2009  . Drug abuse ([place])   . End stage renal disease on dialysis [facility]) NEPHROLOGIST-   DR [facility]  IN [place]   HEMODIALYSIS --   TUES/  THURS/  SAT  . Gastrointestinal bleed 06/13/2017   From chart...hx of multiple GI bleeds  . GERD (gastroesophageal reflux disease)   . Hyperlipidemia   . Hypertension   . PAD (peripheral artery disease) ([place])   . Renal insufficiency    Per pt, 32 oz fluid restriction per day  . S/P triple vessel bypass 06/09/2016   2009ish  . Suicidal ideation    & HOMICIDAL IDEATION --  06-16-2013   ADMITTED TO BEHAVIOR HEALTH    Patient Active Problem List   Diagnosis Date Noted  . PAD (peripheral artery disease) ([place]) 04/14/2018  . Healthcare-associated pneumonia 01/15/2018  . Acute on chronic respiratory failure ([place]) 01/15/2018  . Acute pulmonary edema ([place]) 12/23/2017  . Acute respiratory failure ([place]) 10/29/2017  . ESRD (end stage renal disease) on dialysis ([place]) 07/28/2017  . Protein-calorie malnutrition, severe 06/14/2017  . Encounter for dialysis [facility])   . Palliative care by specialist   . Goals of care, counseling/discussion   . Malnutrition of moderate degree 06/05/2017  . Secondary esophageal varices without bleeding ([place])   . Stomach irritation   .  Idiopathic esophageal varices without bleeding ([place])   . Alcoholic hepatitis with ascites 05/24/2017  . ESRD (end stage renal disease) ([place]) 04/28/2017  . Uremia 03/08/2017  . ESRD on hemodialysis ([place]) 03/03/2017  . Weakness 02/28/2017  . Hypocalcemia 02/22/2017  . Shortness of breath 11/26/2016  . COPD (chronic obstructive pulmonary disease) ([place]) 10/30/2016  . COPD exacerbation ([place]) 10/29/2016  . Anemia   . Heme positive stool   . Ulceration of intestine   . Benign neoplasm of transverse colon   . Acute gastrointestinal hemorrhage   . Esophageal candidiasis ([place])   . Angiodysplasia of intestinal tract   . Acute respiratory failure with hypoxia ([place]) 07/03/2016  . GI bleeding 06/24/2016  . Rectal bleeding 06/14/2016  . Anemia of chronic disease 06/01/2016  . MRSA carrier 06/01/2016  . Chronic renal failure 05/23/2016  . Ischemic heart disease 05/23/2016  . Angiodysplasia of small intestine   . Melena   . Small bowel bleed not requiring more than 4 units of blood in 24 hours, ICU, or surgery   . Anemia due to chronic blood loss   . Abdominal pain 05/05/2016  . Acute posthemorrhagic anemia 04/17/2016  . Gastrointestinal bleed 04/17/2016  . History of esophagogastroduodenoscopy (EGD) 04/17/2016  . Elevated troponin 04/17/2016  . Alcohol abuse 04/17/2016  . Upper GI bleed 01/19/2016  . Blood in stool   . Angiodysplasia of stomach and duodenum with hemorrhage   . Gastritis   .  Reflux esophagitis   . GI bleed 05/16/2015  . Acute GI bleeding   . Symptomatic anemia 04/30/2015  . HTN (hypertension) 04/06/2015  . GERD (gastroesophageal reflux disease) 04/06/2015  . HLD (hyperlipidemia) 04/06/2015  . Dyspnea 04/06/2015  . Cirrhosis of liver with ascites ([place]) 04/06/2015  . Ascites 04/06/2015  . GIB (gastrointestinal bleeding) 03/23/2015  . Homicidal ideation 06/19/2013  . Suicidal intent 06/19/2013  . Homicidal ideations 06/19/2013  . Hyperkalemia 06/16/2013  . Mandible  fracture ([place]) 06/05/2013  . Fracture, mandible ([place]) 06/02/2013  . Coronary atherosclerosis of native coronary artery 06/02/2013  . ESRD on dialysis ([place]) 06/02/2013  . Mandible open fracture ([place]) 06/02/2013    Past Surgical History:  Procedure Laterality Date  . A/V FISTULAGRAM Right 06/06/2017   Procedure: A/V FISTULAGRAM;  Surgeon: Katha Cabal, MD;  Location: [place] CV LAB;  Service: Cardiovascular;  Laterality: Right;  . A/V SHUNT INTERVENTION N/A 06/06/2017   Procedure: A/V SHUNT INTERVENTION;  Surgeon: Katha Cabal, MD;  Location: [place] CV LAB;  Service: Cardiovascular;  Laterality: N/A;  . AGILE CAPSULE N/A 06/19/2016   Procedure: AGILE CAPSULE;  Surgeon: [name], MD;  Location: [facility] ENDOSCOPY;  Service: Endoscopy;  Laterality: N/A;  . COLONOSCOPY WITH PROPOFOL N/A 06/18/2016   Procedure: COLONOSCOPY WITH PROPOFOL;  Surgeon: [name], MD;  Location: [facility] ENDOSCOPY;  Service: Endoscopy;  Laterality: N/A;  . COLONOSCOPY WITH PROPOFOL N/A 08/12/2016   Procedure: COLONOSCOPY WITH PROPOFOL;  Surgeon: [name], MD;  Location: [facility] ENDOSCOPY;  Service: Endoscopy;  Laterality: N/A;  . COLONOSCOPY WITH PROPOFOL N/A 05/05/2017   Procedure: COLONOSCOPY WITH PROPOFOL;  Surgeon: Manya Silvas, MD;  Location: [facility] ENDOSCOPY;  Service: Endoscopy;  Laterality: N/A;  . CORONARY ANGIOPLASTY  ?   PT UNABLE TO TELL IF  BEFORE OR AFTER  CABG  . CORONARY ARTERY BYPASS GRAFT  2008  ([place] , [place])   3 VESSEL  . DIALYSIS FISTULA CREATION  LAST SURGERY  APPOX  2008  . ENTEROSCOPY N/A 05/10/2016   Procedure: ENTEROSCOPY;  Surgeon: Jerene Bears, MD;  Location: [place];  Service: Gastroenterology;  Laterality: N/A;  . ENTEROSCOPY N/A 08/12/2016   Procedure: ENTEROSCOPY;  Surgeon: [name], MD;  Location: [facility] ENDOSCOPY;  Service: Endoscopy;  Laterality: N/A;  . ENTEROSCOPY Left 06/03/2017   Procedure: ENTEROSCOPY;  Surgeon: Virgel Manifold, MD;  Location: [facility]  ENDOSCOPY;  Service: Endoscopy;  Laterality: Left;  Procedure date will ultimately depend on when patient is medically optimized before the procedure, pending hemodialysis and blood transfusions etc. Will place on schedule and change depending on clinical status.   . ENTEROSCOPY N/A 06/05/2017   Procedure: ENTEROSCOPY;  Surgeon: Virgel Manifold, MD;  Location: [facility] ENDOSCOPY;  Service: Endoscopy;  Laterality: N/A;  . ENTEROSCOPY N/A 06/15/2017   Procedure: Push ENTEROSCOPY;  Surgeon: [name], MD;  Location[facility] ENDOSCOPY;  Service: Endoscopy;  Laterality: N/A;  . ESOPHAGOGASTRODUODENOSCOPY N/A 05/07/2015   Procedure: ESOPHAGOGASTRODUODENOSCOPY (EGD);  Surgeon: Hulen Luster, MD;  Location: [facility] ENDOSCOPY;  Service: Endoscopy;  Laterality: N/A;  . ESOPHAGOGASTRODUODENOSCOPY (EGD) WITH PROPOFOL N/A 05/17/2015   Procedure: ESOPHAGOGASTRODUODENOSCOPY (EGD) WITH PROPOFOL;  Surgeon: [name], MD;  Location: [facility] ENDOSCOPY;  Service: Endoscopy;  Laterality: N/A;  . ESOPHAGOGASTRODUODENOSCOPY (EGD) WITH PROPOFOL N/A 01/20/2016   Procedure: ESOPHAGOGASTRODUODENOSCOPY (EGD) WITH PROPOFOL;  Surgeon: [name], MD;  Location: [facility] ENDOSCOPY;  Service: Endoscopy;  Laterality: N/A;  . ESOPHAGOGASTRODUODENOSCOPY (EGD) WITH PROPOFOL N/A 04/17/2016   Procedure: ESOPHAGOGASTRODUODENOSCOPY (EGD) WITH PROPOFOL;  Surgeon: Lin Landsman, MD;  Location: [facility] ENDOSCOPY;  Service: Gastroenterology;  Laterality: N/A;  . ESOPHAGOGASTRODUODENOSCOPY (EGD) WITH PROPOFOL  05/09/2016   Procedure: ESOPHAGOGASTRODUODENOSCOPY (EGD) WITH PROPOFOL;  Surgeon: Jerene Bears, MD;  Location: [place];  Service: Endoscopy;;  . ESOPHAGOGASTRODUODENOSCOPY (EGD) WITH PROPOFOL N/A 06/16/2016   Procedure: ESOPHAGOGASTRODUODENOSCOPY (EGD) WITH PROPOFOL;  Surgeon: [name], MD;  Location: [facility] ENDOSCOPY;  Service: Endoscopy;  Laterality: N/A;  . ESOPHAGOGASTRODUODENOSCOPY (EGD) WITH PROPOFOL N/A 05/05/2017   Procedure:  ESOPHAGOGASTRODUODENOSCOPY (EGD) WITH PROPOFOL;  Surgeon: Manya Silvas, MD;  Location: [facility] ENDOSCOPY;  Service: Endoscopy;  Laterality: N/A;  . ESOPHAGOGASTRODUODENOSCOPY (EGD) WITH PROPOFOL N/A 06/15/2017   Procedure: ESOPHAGOGASTRODUODENOSCOPY (EGD) WITH PROPOFOL;  Surgeon: [name], MD;  Location: [facility] ENDOSCOPY;  Service: Endoscopy;  Laterality: N/A;  . ESOPHAGOGASTRODUODENOSCOPY (EGD) WITH PROPOFOL N/A 04/27/2018   Procedure: ESOPHAGOGASTRODUODENOSCOPY (EGD) WITH PROPOFOL;  Surgeon: [name], MD;  Location: [facility] ENDOSCOPY;  Service: Endoscopy;  Laterality: N/A;  . GIVENS CAPSULE STUDY N/A 05/07/2016   Procedure: GIVENS CAPSULE STUDY;  Surgeon: Doran Stabler, MD;  Location: [place];  Service: Endoscopy;  Laterality: N/A;  . LOWER EXTREMITY ANGIOGRAPHY Left 04/19/2018   Procedure: LOWER EXTREMITY ANGIOGRAPHY;  Surgeon: Algernon Huxley, MD;  Location: [place] CV LAB;  Service: Cardiovascular;  Laterality: Left;  Marland Kitchen MANDIBULAR HARDWARE REMOVAL N/A 07/29/2013   Procedure: REMOVAL OF ARCH BARS;  Surgeon: Theodoro Kos, DO;  Location: [place];  Service: Plastics;  Laterality: N/A;  . ORIF MANDIBULAR FRACTURE N/A 06/05/2013   Procedure: REPAIR OF MANDIBULAR FRACTURE x 2 with maxillo-mandibular fixation ;  Surgeon: Theodoro Kos, DO;  Location: [place];  Service: Plastics;  Laterality: N/A;  . PARACENTESIS    . PERIPHERAL ARTERIAL STENT GRAFT Left     Prior to Admission medications   Medication Sig Start Date End Date Taking? Authorizing Provider  acetaminophen (TYLENOL) 325 MG tablet Take 2 tablets (650 mg total) by mouth every 6 (six) hours as needed for mild pain (or Fever >/= 101). 05/07/18   Gouru, Aruna, MD  alum & mag hydroxide-simeth (MAALOX/MYLANTA) 200-200-20 MG/5ML suspension Take 30 mLs by mouth every 4 (four) hours as needed for indigestion. 05/07/18   Nicholes Mango, MD  aspirin EC 81 MG tablet Take 1 tablet (81 mg total) by mouth daily. 04/20/18 04/20/19   Epifanio Lesches, MD  B Complex-C-Folic Acid (RENA-VITE PO) Take 1 tablet by mouth daily. 11/28/16   [provider]  budesonide-formoterol (SYMBICORT) 160-4.5 MCG/ACT inhaler Inhale 2 puffs into the lungs daily. 05/07/18   Nicholes Mango, MD  calcium acetate (PHOSLO) 667 MG capsule Take 2,001 mg by mouth 3 (three) times daily.    [provider]  docusate sodium (COLACE) 100 MG capsule Take 1 capsule (100 mg total) by mouth 2 (two) times daily as needed for mild constipation. 05/07/18   Nicholes Mango, MD  folic acid (FOLVITE) 1 MG tablet Take 1 tablet (1 mg total) by mouth daily. 10/31/17   Fritzi Mandes, MD  gabapentin (NEURONTIN) 300 MG capsule Take 300 mg by mouth 3 (three) times daily.  08/16/16   [provider]  ipratropium-albuterol (DUONEB) 0.5-2.5 (3) MG/3ML SOLN Take 3 mLs by nebulization every 6 (six) hours as needed. 05/07/18   Nicholes Mango, MD  labetalol (NORMODYNE) 100 MG tablet Take 100 mg by mouth daily.    [provider]  multivitamin (RENA-VIT) TABS tablet Take 1 tablet by mouth at bedtime. 10/31/17   Fritzi Mandes, MD  Nutritional Supplements (FEEDING SUPPLEMENT, NEPRO CARB STEADY,) LIQD Take 237 mLs by mouth 2 (two) times daily between meals. 05/07/18   Nicholes Mango, MD  omeprazole (PRILOSEC) 40 MG capsule Take 1 capsule (40 mg total) by mouth 2 (two) times daily. 05/07/18   Gouru, Illene Silver, MD  oxyCODONE (OXY IR/ROXICODONE) 5 MG immediate release tablet Take 1 tablet (5 mg total) by mouth every 6 (six) hours as needed for moderate pain. 05/07/18   Gouru, Illene Silver, MD  sucralfate (CARAFATE) 1 GM/10ML suspension Take 10 mLs (1 g total) by mouth 4 (four) times daily -  with meals and at bedtime. 05/07/18   Gouru, Illene Silver, MD  tiotropium (SPIRIVA HANDIHALER) 18 MCG inhalation capsule Place 1 capsule (18 mcg total) into inhaler and inhale daily. 05/07/18 08/05/18  Nicholes Mango, MD  vitamin C (VITAMIN C) 250 MG tablet Take 1 tablet (250 mg total) by mouth 2 (two) times  daily. 04/20/18   Epifanio Lesches, MD    Allergies Patient has no known allergies.  Family History  Problem Relation Age of Onset  . Colon cancer Mother   . Cancer Father   . Cancer Sister   . Kidney disease Brother     Social History Social History   Tobacco Use  . Smoking status: Current Every Day Smoker    Packs/day: 0.15    Years: 40.00    Pack years: 6.00    Types: Cigarettes  . Smokeless tobacco: Never Used  Substance Use Topics  . Alcohol use: No    Comment: pt reports quitting after learning about cirrhosis  . Drug use: No    Frequency: 7.0 times per week    Types: Marijuana, Cocaine    Review of Systems  Constitutional: No fever/chills, here for dialysis Eyes: No visual changes. ENT: No sore throat. Respiratory: Denies cough Genitourinary: Negative for dysuria. Musculoskeletal: Negative for back pain. Skin: Negative for rash.    ____________________________________________   PHYSICAL EXAM:  VITAL SIGNS: ED Triage Vitals  Enc Vitals Group     BP 06/13/18 1055 108/75     Pulse Rate 06/13/18 1055 88     Resp 06/13/18 1055 20     Temp 06/13/18 1055 (!) 97.5 F (36.4 C)     Temp Source 06/13/18 1055 Oral     SpO2 06/13/18 1055 100 %     Weight 06/13/18 1054 163 lb 9.3 oz (74.2 kg)     Height 06/13/18 1054 6\' 3"  (1.905 m)     Head Circumference --      Peak Flow --      Pain Score 06/13/18 1054 0     Pain Loc --      Pain Edu? --      Excl. in [place]? --     Constitutional: Alert and oriented. Well appearing and in no acute distress. Eyes: Conjunctivae are normal.  Head: Atraumatic. Nose: No congestion/rhinnorhea. Mouth/Throat: Mucous membranes are moist.   Neck:  supple no lymphadenopathy noted Cardiovascular: Normal rate, regular rhythm.  Respiratory: Normal respiratory effort.  No retractions GU: deferred Musculoskeletal: FROM all extremities, warm and well perfused Neurologic:  Normal speech and language.  Skin:  Skin is warm, dry  and intact. No rash noted. Psychiatric: Mood and affect are normal. Speech and behavior are normal.  ____________________________________________   LABS (all labs ordered are listed, but only abnormal results are displayed)  Labs Reviewed  CBC - Abnormal; Notable for the following components:      Result Value  RBC 2.85 (*)    Hemoglobin 6.5 (*)    HCT 23.8 (*)    MCH 22.8 (*)    MCHC 27.3 (*)    RDW 16.6 (*)    All other components within normal limits  BASIC METABOLIC PANEL - Abnormal; Notable for the following components:   Potassium 6.3 (*)    Glucose, Bld 117 (*)    BUN 38 (*)    Creatinine, Ser 7.05 (*)    Calcium 8.6 (*)    GFR calc non Af Amer 8 (*)    GFR calc Af Amer 9 (*)    All other components within normal limits   ____________________________________________   ____________________________________________  RADIOLOGY    ____________________________________________   PROCEDURES  Procedure(s) performed: No  Procedures    ____________________________________________   INITIAL IMPRESSION / ASSESSMENT AND PLAN / ED COURSE  Pertinent labs & imaging results that were available during my care of the patient were reviewed by me and considered in my medical decision making (see chart for details).   Patient is here for his dialysis.  Dr. [name] was notified.  He is placing orders for dialysis.  He was escorted via the tech.   dialysis was completed, patient was brought back to the ER for discharge in stable condition  As part of my medical decision making, I reviewed the following data within the [place] notes reviewed and incorporated, Old chart reviewed, A consult was requested and obtained from this/these consultant(s) nephrology, Notes from prior ED visits and [place] Controlled Substance Database  ____________________________________________   FINAL CLINICAL IMPRESSION(S) / ED DIAGNOSES  Final diagnoses:  Encounter for  dialysis [facility])      NEW MEDICATIONS STARTED DURING THIS VISIT:  Current Discharge Medication List       Note:  This document was prepared using Dragon voice recognition software and may include unintentional dictation errors.    Versie Starks, PA-C 06/13/18 [place], [place], MD 06/14/18 1537

## 2018-06-13 NOTE — Progress Notes (Signed)
Pre HD Assessment    06/13/18 1115  Neurological  Level of Consciousness Alert  Orientation Level Oriented X4  Respiratory  Respiratory Pattern Regular  Chest Assessment Chest expansion symmetrical  Bilateral Breath Sounds Diminished  Cough None  Cardiac  Pulse Regular  Heart Sounds S1, S2  ECG Monitor Yes  Cardiac Rhythm NSR  Ectopy Couplet PVC's  Vascular  R Radial Pulse +2  L Radial Pulse +2  Edema Generalized  Generalized Edema +1  Psychosocial  Psychosocial (WDL) WDL  Patient Behaviors Cooperative  Emotional support given Given to patient

## 2018-06-13 NOTE — Progress Notes (Signed)
HD Tx started    06/13/18 1130  Vital Signs  Pulse Rate 85  Pulse Rate Source Monitor  BP 121/86  BP Location Left Arm  BP Method Automatic  Patient Position (if appropriate) Sitting  Oxygen Therapy  SpO2 100 %  O2 Device Room Therapist, nutritional Number 3  Station Number 3  UF/Alarm Test Passed  Conductivity: Meter 13.8  Conductivity: Machine  13.8  pH 7.4  Reverse Osmosis Main  During Hemodialysis Assessment  Blood Flow Rate (mL/min) 400 mL/min  Arterial Pressure (mmHg) -180 mmHg  Venous Pressure (mmHg) 160 mmHg  Transmembrane Pressure (mmHg) 60 mmHg  Ultrafiltration Rate (mL/min) 670 mL/min  Dialysate Flow Rate (mL/min) 600 ml/min  Conductivity: Machine  13.7  HD Safety Checks Performed Yes  Dialysis Fluid Bolus Normal Saline  Bolus Amount (mL) 250 mL  Intra-Hemodialysis Comments Tx initiated

## 2018-06-13 NOTE — Progress Notes (Signed)
Post HD Tx    06/13/18 1445  Hand-Off documentation  Report given to (Full Name) Alvira Monday, RN   Report received from (Full Name) Beatris Ship, RN   Vital Signs  Temp 97.6 F (36.4 C)  Temp Source Oral  Pulse Rate 91  Pulse Rate Source Monitor  Resp 17  BP 119/61  BP Location Left Arm  BP Method Automatic  Patient Position (if appropriate) Sitting  Oxygen Therapy  SpO2 100 %  O2 Device Room Air  Post-Hemodialysis Assessment  Rinseback Volume (mL) 250 mL  KECN 69.4 V  Dialyzer Clearance Lightly streaked  Duration of HD Treatment -hour(s) 3.25 hour(s) (Pt request to end tx, unbearable itching )  Hemodialysis Intake (mL) 500 mL  UF Total -Machine (mL) 2306 mL  Net UF (mL) 1806 mL  Tolerated HD Treatment Yes  Hemodialysis Catheter Right Internal jugular Double-lumen  No Placement Date or Time found.   Placed prior to admission: Yes  Orientation: Right  Access Location: Internal jugular  Hemodialysis Catheter Type: Double-lumen  Site Condition No complications  Post treatment catheter status Capped and Clamped

## 2018-06-13 NOTE — ED Triage Notes (Signed)
Pt presents to ED for routine outpatient dialysis. Denies complaints at this time.

## 2018-06-13 NOTE — Progress Notes (Signed)
[facility]  ROUNDING NOTE   Subjective:   Presents to ER for evaluation of uremia and to undergo hemodialysis  Denies any acute shortness of breath, fever, cough Last potassium was elevated at 6.3.  Hemoglobin was 6.5 Patient denies any acute bleeding Complain of itching and pain in the foot   HEMODIALYSIS FLOWSHEET:  Blood Flow Rate (mL/min): 400 mL/min Arterial Pressure (mmHg): -180 mmHg Venous Pressure (mmHg): 160 mmHg Transmembrane Pressure (mmHg): 60 mmHg Ultrafiltration Rate (mL/min): 670 mL/min Dialysate Flow Rate (mL/min): 600 ml/min Conductivity: Machine : 13.7 Conductivity: Machine : 13.7 Dialysis Fluid Bolus: Normal Saline Bolus Amount (mL): 250 mL       Objective:  Vital signs in last 24 hours:  Temp:  [97.5 F (36.4 C)] 97.5 F (36.4 C) (04/01 1055) Pulse Rate:  [88] 88 (04/01 1055) Resp:  [20] 20 (04/01 1055) BP: (108)/(75) 108/75 (04/01 1055) SpO2:  [100 %] 100 % (04/01 1055) Weight:  [74.2 kg] 74.2 kg (04/01 1054)  Weight change:  Filed Weights   06/13/18 1054  Weight: 74.2 kg    Intake/Output: No intake/output data recorded.   Intake/Output this shift:  No intake/output data recorded.  Physical Exam: General: NAD, sitting in dialysis chair  Head: Normocephalic, atraumatic. Moist oral mucosal membranes  Eyes: Anicteric,  Neck: Supple, trachea midline  Lungs:  Clear to auscultation  Heart: Regular rate and rhythm  Abdomen:  Soft, nontender, +ascites  Extremities: no peripheral edema.  Neurologic: Nonfocal, moving all four extremities  Skin: No lesions  Access: RIJ permcath    Basic Metabolic Panel: Recent Labs  Lab 06/07/18 1451 06/11/18 1631  NA 137 136  K 5.0 6.3*  CL 97* 99  CO2 30 29  GLUCOSE 102* 89  BUN 25* 38*  CREATININE 5.43* 7.21*  CALCIUM 8.7* 8.5*  PHOS 2.5 2.1*    Liver Function Tests: Recent Labs  Lab 06/07/18 1451 06/11/18 1631  ALBUMIN 2.7* 2.7*   No results for input(s): LIPASE,  AMYLASE in the last 168 hours. No results for input(s): AMMONIA in the last 168 hours.  CBC: Recent Labs  Lab 06/07/18 1451 06/11/18 1631  WBC 6.7 6.0  HGB 6.7* 6.5*  HCT 24.4* 23.6*  MCV 85.6 84.6  PLT 356 345    Cardiac Enzymes: No results for input(s): CKTOTAL, CKMB, CKMBINDEX, TROPONINI in the last 168 hours.  BNP: Invalid input(s): POCBNP  CBG: No results for input(s): GLUCAP in the last 168 hours.  Microbiology: Results for orders placed or performed during the hospital encounter of 04/26/18  MRSA PCR Screening     Status: None   Collection Time: 04/26/18  1:09 PM  Result Value Ref Range Status   MRSA by PCR NEGATIVE NEGATIVE Final    Comment:        The GeneXpert MRSA Assay (FDA approved for NASAL specimens only), is one component of a comprehensive MRSA colonization surveillance program. It is not intended to diagnose MRSA infection nor to guide or monitor treatment for MRSA infections. Performed at [facility], [address] [place], [place] [address]     Coagulation Studies: No results for input(s): LABPROT, INR in the last 72 hours.  Urinalysis: No results for input(s): COLORURINE, LABSPEC, PHURINE, GLUCOSEU, HGBUR, BILIRUBINUR, KETONESUR, PROTEINUR, UROBILINOGEN, NITRITE, LEUKOCYTESUR in the last 72 hours.  Invalid input(s): APPERANCEUR    Imaging: No results found.   Medications:       Assessment/ Plan:  [name] is a 58 y.o. black male  withend stage renal disease on hemodialysis secondary to Alport's syndrome, end stage liver disease with ascites, hypertension, peripheral vascular disease, coronary artery disease, hyperlipidemia, gastrointestinal AVMs, pulmonary hypertension  [facility])RIJ permcath.210 min,currently no transportation to dialysis unit  1. Hyperkalemia:   - placed on hemodialysis treatment. 2K bath  2. End Stage Renal Disease with uremia: on hemodialysis.  Placed on  hemodialysis treatment.   3. Anemia of chronic kidney disease: hemoglobin 6.5 - EPO with HD treatment.   4. Secondary Hyperparathyroidism:  phosphorus at goal  5. Ascites:  no indication for paracentesis today.    LOS: 0 Stanley [name] 4/1/202011:37 AM

## 2018-06-13 NOTE — ED Notes (Signed)
Pt states he does not have time to sign d/c, states he has to go right now! Pt in NAD at this time.

## 2018-06-13 NOTE — Progress Notes (Signed)
Post HD Assessment    06/13/18 1445  Neurological  Level of Consciousness Alert  Orientation Level Oriented X4  Respiratory  Respiratory Pattern Regular  Chest Assessment Chest expansion symmetrical  Bilateral Breath Sounds Clear;Diminished  Cough None  Cardiac  Pulse Regular  Heart Sounds S1, S2  ECG Monitor Yes  Cardiac Rhythm NSR  Ectopy Couplet PVC's  Vascular  R Radial Pulse +2  L Radial Pulse +2  Edema Generalized  Generalized Edema +1  Psychosocial  Psychosocial (WDL) WDL  Patient Behaviors Cooperative  Emotional support given Given to patient

## 2018-06-13 NOTE — Progress Notes (Signed)
Pre HD TX   06/13/18 1125  Vital Signs  Temp (!) 97.4 F (36.3 C)  Temp Source Oral  Pulse Rate 85  Pulse Rate Source Monitor  Resp 19  BP 113/76  BP Location Left Arm  BP Method Automatic  Patient Position (if appropriate) Sitting  Oxygen Therapy  SpO2 100 %  O2 Device Room Air  Pain Assessment  Pain Scale 0-10  Pain Score 8  Pain Type Neuropathic pain  Pain Location Foot  Pain Orientation Left  Pain Intervention(s) MD notified (Comment);Medication (See eMAR)  Dialysis Weight  Weight 74.2 kg  Type of Weight Pre-Dialysis  Time-Out for Hemodialysis  What Procedure? HD  Pt Identifiers(min of two) First/Last Name;MRN/Account#  Correct Site? Yes  Correct Side? Yes  Correct Procedure? Yes  Consents Verified? Yes  Rad Studies Available? N/A  Safety Precautions Reviewed? Yes  Engineer, civil (consulting) Number 3  Station Number 3  UF/Alarm Test Passed  Conductivity: Meter 13.8  Conductivity: Machine  13.8  pH 7.4  Reverse Osmosis Main  Normal Saline Lot Number [id number]  Dialyzer Lot Number [id number]  Disposable Set Lot Number [id number]  Machine Temperature 98.6 F (37 C)  Musician and Audible Yes  Blood Lines Intact and Secured Yes  Pre Treatment Patient Checks  Vascular access used during treatment Catheter  Patient is receiving dialysis in a chair Yes  Hepatitis B Surface Antigen Results Negative  Date Hepatitis B Surface Antigen Drawn 06/16/17  Hepatitis B Surface Antibody  (>10)  Date Hepatitis B Surface Antibody Drawn 06/16/17  Hemodialysis Consent Verified Yes  Hemodialysis Standing Orders Initiated Yes  ECG (Telemetry) Monitor On Yes  Prime Ordered Normal Saline  Length of  DialysisTreatment -hour(s) 3.5 Hour(s)  Dialysis Treatment Comments Na140  Dialyzer Elisio 17H NR  Dialysate 2K, 2.5 Ca  Dialysis Anticoagulant None  Dialysate Flow Ordered 600  Blood Flow Rate Ordered 400 mL/min  Ultrafiltration Goal 2 Liters  Pre Treatment Labs Renal  panel;CBC  Dialysis Blood Pressure Support Ordered Normal Saline  Education / Care Plan  Dialysis Education Provided Yes  Documented Education in Care Plan Yes  Hemodialysis Catheter Right Internal jugular Double-lumen  No Placement Date or Time found.   Placed prior to admission: Yes  Orientation: Right  Access Location: Internal jugular  Hemodialysis Catheter Type: Double-lumen  Site Condition No complications  Blue Lumen Status Flushed  Red Lumen Status Flushed  Purple Lumen Status N/A  Dressing Status Clean;Dry;Intact

## 2018-06-13 NOTE — ED Notes (Addendum)
Pt being transported to dialysis at this time

## 2018-06-15 ENCOUNTER — Emergency Department
Admission: EM | Admit: 2018-06-15 | Discharge: 2018-06-15 | Disposition: A | Discharge status: Home / Self Care | Payer: Medicare Other | Attending: Emergency Medicine | Admitting: Emergency Medicine

## 2018-06-15 ENCOUNTER — Other Ambulatory Visit: Payer: Self-pay

## 2018-06-15 DIAGNOSIS — N186 End stage renal disease: Secondary | ICD-10-CM | POA: Insufficient documentation

## 2018-06-15 DIAGNOSIS — D631 Anemia in chronic kidney disease: Secondary | ICD-10-CM | POA: Diagnosis not present

## 2018-06-15 DIAGNOSIS — Z7982 Long term (current) use of aspirin: Secondary | ICD-10-CM | POA: Insufficient documentation

## 2018-06-15 DIAGNOSIS — Z992 Dependence on renal dialysis: Secondary | ICD-10-CM | POA: Insufficient documentation

## 2018-06-15 DIAGNOSIS — Z79899 Other long term (current) drug therapy: Secondary | ICD-10-CM | POA: Diagnosis not present

## 2018-06-15 DIAGNOSIS — I509 Heart failure, unspecified: Secondary | ICD-10-CM | POA: Diagnosis not present

## 2018-06-15 DIAGNOSIS — E8770 Fluid overload, unspecified: Secondary | ICD-10-CM | POA: Diagnosis not present

## 2018-06-15 DIAGNOSIS — Z4931 Encounter for adequacy testing for hemodialysis: Secondary | ICD-10-CM | POA: Diagnosis not present

## 2018-06-15 DIAGNOSIS — I132 Hypertensive heart and chronic kidney disease with heart failure and with stage 5 chronic kidney disease, or end stage renal disease: Secondary | ICD-10-CM | POA: Insufficient documentation

## 2018-06-15 DIAGNOSIS — F17210 Nicotine dependence, cigarettes, uncomplicated: Secondary | ICD-10-CM | POA: Diagnosis not present

## 2018-06-15 DIAGNOSIS — N2581 Secondary hyperparathyroidism of renal origin: Secondary | ICD-10-CM | POA: Diagnosis not present

## 2018-06-15 DIAGNOSIS — E875 Hyperkalemia: Secondary | ICD-10-CM | POA: Diagnosis not present

## 2018-06-15 MED ORDER — CHLORHEXIDINE GLUCONATE CLOTH 2 % EX PADS
6.0000 | MEDICATED_PAD | Freq: Every day | CUTANEOUS | Status: DC
  Filled 2018-06-15: qty 6

## 2018-06-15 MED ORDER — EPOETIN ALFA 4000 UNIT/ML IJ SOLN
4000.0000 [IU] | Freq: Once | INTRAMUSCULAR | Status: AC
  Administered 2018-06-15: 4000 [IU] via INTRAVENOUS
  Filled 2018-06-15: qty 1

## 2018-06-15 MED ORDER — DIPHENHYDRAMINE HCL 50 MG/ML IJ SOLN
25.0000 mg | Freq: Once | INTRAMUSCULAR | Status: AC
  Administered 2018-06-15: 25 mg via INTRAVENOUS

## 2018-06-15 MED ORDER — MORPHINE SULFATE (PF) 2 MG/ML IV SOLN
2.0000 mg | Freq: Once | INTRAVENOUS | Status: AC
  Administered 2018-06-15: 2 mg via INTRAVENOUS
  Filled 2018-06-15: qty 1

## 2018-06-15 NOTE — Progress Notes (Signed)
HD Tx completed, tolerated well, UF goal met   06/15/18 2100  Vital Signs  Pulse Rate (!) 102  Pulse Rate Source Monitor  Resp 18  BP 127/73  BP Location Left Arm  BP Method Automatic  Patient Position (if appropriate) Sitting  Oxygen Therapy  SpO2 100 %  O2 Device Room Air  Pain Assessment  Pain Scale 0-10  Pain Score 0  During Hemodialysis Assessment  HD Safety Checks Performed Yes  KECN 64 [facility]  Dialysis Fluid Bolus Normal Saline  Bolus Amount (mL) 250 mL  Intra-Hemodialysis Comments Tx completed;Tolerated well

## 2018-06-15 NOTE — ED Provider Notes (Signed)
[facility] Emergency Department Provider Note   ____________________________________________    I have reviewed the triage vital signs and the nursing notes.   HISTORY  Chief Complaint dialysis     HPI Stanley [name] is a 58 y.o. male with a history of end-stage renal disease who presents for regularly scheduled Friday dialysis.  Patient has no medical complaints, he does come to the emergency department for routine dialysis.  Denies shortness of breath.  No chest pain  Past Medical History:  Diagnosis Date  . Alcohol abuse   . CHF (congestive heart failure) ([place])   . Cirrhosis ([place])   . Coronary artery disease 2009  . Drug abuse ([place])   . End stage renal disease on dialysis [facility]) NEPHROLOGIST-   DR [facility]  IN [place]   HEMODIALYSIS --   TUES/  THURS/  SAT  . Gastrointestinal bleed 06/13/2017   From chart...hx of multiple GI bleeds  . GERD (gastroesophageal reflux disease)   . Hyperlipidemia   . Hypertension   . PAD (peripheral artery disease) ([place])   . Renal insufficiency    Per pt, 32 oz fluid restriction per day  . S/P triple vessel bypass 06/09/2016   2009ish  . Suicidal ideation    & HOMICIDAL IDEATION --  06-16-2013   ADMITTED TO BEHAVIOR HEALTH    Patient Active Problem List   Diagnosis Date Noted  . PAD (peripheral artery disease) ([place]) 04/14/2018  . Healthcare-associated pneumonia 01/15/2018  . Acute on chronic respiratory failure ([place]) 01/15/2018  . Acute pulmonary edema ([name]) 12/23/2017  . Acute respiratory failure ([place]) 10/29/2017  . ESRD (end stage renal disease) on dialysis ([place]) 07/28/2017  . Protein-calorie malnutrition, severe 06/14/2017  . Encounter for dialysis [facility])   . Palliative care by specialist   . Goals of care, counseling/discussion   . Malnutrition of moderate degree 06/05/2017  . Secondary esophageal varices without bleeding ([place])   . Stomach irritation   . Idiopathic esophageal varices without  bleeding ([place])   . Alcoholic hepatitis with ascites 05/24/2017  . ESRD (end stage renal disease) ([place]) 04/28/2017  . Uremia 03/08/2017  . ESRD on hemodialysis ([place]) 03/03/2017  . Weakness 02/28/2017  . Hypocalcemia 02/22/2017  . Shortness of breath 11/26/2016  . COPD (chronic obstructive pulmonary disease) ([place]) 10/30/2016  . COPD exacerbation ([place]) 10/29/2016  . Anemia   . Heme positive stool   . Ulceration of intestine   . Benign neoplasm of transverse colon   . Acute gastrointestinal hemorrhage   . Esophageal candidiasis ([place])   . Angiodysplasia of intestinal tract   . Acute respiratory failure with hypoxia ([place]) 07/03/2016  . GI bleeding 06/24/2016  . Rectal bleeding 06/14/2016  . Anemia of chronic disease 06/01/2016  . MRSA carrier 06/01/2016  . Chronic renal failure 05/23/2016  . Ischemic heart disease 05/23/2016  . Angiodysplasia of small intestine   . Melena   . Small bowel bleed not requiring more than 4 units of blood in 24 hours, ICU, or surgery   . Anemia due to chronic blood loss   . Abdominal pain 05/05/2016  . Acute posthemorrhagic anemia 04/17/2016  . Gastrointestinal bleed 04/17/2016  . History of esophagogastroduodenoscopy (EGD) 04/17/2016  . Elevated troponin 04/17/2016  . Alcohol abuse 04/17/2016  . Upper GI bleed 01/19/2016  . Blood in stool   . Angiodysplasia of stomach and duodenum with hemorrhage   . Gastritis   . Reflux esophagitis   . GI bleed 05/16/2015  .  Acute GI bleeding   . Symptomatic anemia 04/30/2015  . HTN (hypertension) 04/06/2015  . GERD (gastroesophageal reflux disease) 04/06/2015  . HLD (hyperlipidemia) 04/06/2015  . Dyspnea 04/06/2015  . Cirrhosis of liver with ascites ([place]) 04/06/2015  . Ascites 04/06/2015  . GIB (gastrointestinal bleeding) 03/23/2015  . Homicidal ideation 06/19/2013  . Suicidal intent 06/19/2013  . Homicidal ideations 06/19/2013  . Hyperkalemia 06/16/2013  . Mandible fracture ([place]) 06/05/2013  . Fracture,  mandible ([place]) 06/02/2013  . Coronary atherosclerosis of native coronary artery 06/02/2013  . ESRD on dialysis ([place]) 06/02/2013  . Mandible open fracture ([place]) 06/02/2013    Past Surgical History:  Procedure Laterality Date  . A/V FISTULAGRAM Right 06/06/2017   Procedure: A/V FISTULAGRAM;  Surgeon: Katha Cabal, MD;  Location: [place] CV LAB;  Service: Cardiovascular;  Laterality: Right;  . A/V SHUNT INTERVENTION N/A 06/06/2017   Procedure: A/V SHUNT INTERVENTION;  Surgeon: Katha Cabal, MD;  Location: [place] CV LAB;  Service: Cardiovascular;  Laterality: N/A;  . AGILE CAPSULE N/A 06/19/2016   Procedure: AGILE CAPSULE;  Surgeon: [name], MD;  Location: [facility] ENDOSCOPY;  Service: Endoscopy;  Laterality: N/A;  . COLONOSCOPY WITH PROPOFOL N/A 06/18/2016   Procedure: COLONOSCOPY WITH PROPOFOL;  Surgeon: [name], MD;  Location: [facility] ENDOSCOPY;  Service: Endoscopy;  Laterality: N/A;  . COLONOSCOPY WITH PROPOFOL N/A 08/12/2016   Procedure: COLONOSCOPY WITH PROPOFOL;  Surgeon: [name], MD;  Location[facility] ENDOSCOPY;  Service: Endoscopy;  Laterality: N/A;  . COLONOSCOPY WITH PROPOFOL N/A 05/05/2017   Procedure: COLONOSCOPY WITH PROPOFOL;  Surgeon: Manya Silvas, MD;  Location: [facility] ENDOSCOPY;  Service: Endoscopy;  Laterality: N/A;  . CORONARY ANGIOPLASTY  ?   PT UNABLE TO TELL IF  BEFORE OR AFTER  CABG  . CORONARY ARTERY BYPASS GRAFT  2008  ([place])   3 VESSEL  . DIALYSIS FISTULA CREATION  LAST SURGERY  APPOX  2008  . ENTEROSCOPY N/A 05/10/2016   Procedure: ENTEROSCOPY;  Surgeon: Jerene Bears, MD;  Location: [place];  Service: Gastroenterology;  Laterality: N/A;  . ENTEROSCOPY N/A 08/12/2016   Procedure: ENTEROSCOPY;  Surgeon: [name], MD;  Location: [facility] ENDOSCOPY;  Service: Endoscopy;  Laterality: N/A;  . ENTEROSCOPY Left 06/03/2017   Procedure: ENTEROSCOPY;  Surgeon: Virgel Manifold, MD;  Location: [facility] ENDOSCOPY;  Service: Endoscopy;   Laterality: Left;  Procedure date will ultimately depend on when patient is medically optimized before the procedure, pending hemodialysis and blood transfusions etc. Will place on schedule and change depending on clinical status.   . ENTEROSCOPY N/A 06/05/2017   Procedure: ENTEROSCOPY;  Surgeon: Virgel Manifold, MD;  Location: [facility] ENDOSCOPY;  Service: Endoscopy;  Laterality: N/A;  . ENTEROSCOPY N/A 06/15/2017   Procedure: Push ENTEROSCOPY;  Surgeon: [name], MD;  Location[facility] ENDOSCOPY;  Service: Endoscopy;  Laterality: N/A;  . ESOPHAGOGASTRODUODENOSCOPY N/A 05/07/2015   Procedure: ESOPHAGOGASTRODUODENOSCOPY (EGD);  Surgeon: Hulen Luster, MD;  Location[facility] ENDOSCOPY;  Service: Endoscopy;  Laterality: N/A;  . ESOPHAGOGASTRODUODENOSCOPY (EGD) WITH PROPOFOL N/A 05/17/2015   Procedure: ESOPHAGOGASTRODUODENOSCOPY (EGD) WITH PROPOFOL;  Surgeon: [name], MD;  Location: [facility] ENDOSCOPY;  Service: Endoscopy;  Laterality: N/A;  . ESOPHAGOGASTRODUODENOSCOPY (EGD) WITH PROPOFOL N/A 01/20/2016   Procedure: ESOPHAGOGASTRODUODENOSCOPY (EGD) WITH PROPOFOL;  Surgeon: [name], MD;  Location: [facility] ENDOSCOPY;  Service: Endoscopy;  Laterality: N/A;  . ESOPHAGOGASTRODUODENOSCOPY (EGD) WITH PROPOFOL N/A 04/17/2016   Procedure: ESOPHAGOGASTRODUODENOSCOPY (EGD) WITH PROPOFOL;  Surgeon: Lin Landsman, MD;  Location: [facility] ENDOSCOPY;  Service: Gastroenterology;  Laterality: N/A;  . ESOPHAGOGASTRODUODENOSCOPY (EGD) WITH PROPOFOL  05/09/2016   Procedure: ESOPHAGOGASTRODUODENOSCOPY (EGD) WITH PROPOFOL;  Surgeon: Jerene Bears, MD;  Location: [place];  Service: Endoscopy;;  . ESOPHAGOGASTRODUODENOSCOPY (EGD) WITH PROPOFOL N/A 06/16/2016   Procedure: ESOPHAGOGASTRODUODENOSCOPY (EGD) WITH PROPOFOL;  Surgeon: [name], MD;  Location: [facility] ENDOSCOPY;  Service: Endoscopy;  Laterality: N/A;  . ESOPHAGOGASTRODUODENOSCOPY (EGD) WITH PROPOFOL N/A 05/05/2017   Procedure: ESOPHAGOGASTRODUODENOSCOPY (EGD) WITH PROPOFOL;   Surgeon: Manya Silvas, MD;  Location: [facility] ENDOSCOPY;  Service: Endoscopy;  Laterality: N/A;  . ESOPHAGOGASTRODUODENOSCOPY (EGD) WITH PROPOFOL N/A 06/15/2017   Procedure: ESOPHAGOGASTRODUODENOSCOPY (EGD) WITH PROPOFOL;  Surgeon: [name], MD;  Location: [facility] ENDOSCOPY;  Service: Endoscopy;  Laterality: N/A;  . ESOPHAGOGASTRODUODENOSCOPY (EGD) WITH PROPOFOL N/A 04/27/2018   Procedure: ESOPHAGOGASTRODUODENOSCOPY (EGD) WITH PROPOFOL;  Surgeon: [name], MD;  Location: [facility] ENDOSCOPY;  Service: Endoscopy;  Laterality: N/A;  . GIVENS CAPSULE STUDY N/A 05/07/2016   Procedure: GIVENS CAPSULE STUDY;  Surgeon: Doran Stabler, MD;  Location: [place];  Service: Endoscopy;  Laterality: N/A;  . LOWER EXTREMITY ANGIOGRAPHY Left 04/19/2018   Procedure: LOWER EXTREMITY ANGIOGRAPHY;  Surgeon: Algernon Huxley, MD;  Location: [place] CV LAB;  Service: Cardiovascular;  Laterality: Left;  Marland Kitchen MANDIBULAR HARDWARE REMOVAL N/A 07/29/2013   Procedure: REMOVAL OF ARCH BARS;  Surgeon: Theodoro Kos, DO;  Location: [place];  Service: Plastics;  Laterality: N/A;  . ORIF MANDIBULAR FRACTURE N/A 06/05/2013   Procedure: REPAIR OF MANDIBULAR FRACTURE x 2 with maxillo-mandibular fixation ;  Surgeon: Theodoro Kos, DO;  Location: [place];  Service: Plastics;  Laterality: N/A;  . PARACENTESIS    . PERIPHERAL ARTERIAL STENT GRAFT Left     Prior to Admission medications   Medication Sig Start Date End Date Taking? Authorizing Provider  acetaminophen (TYLENOL) 325 MG tablet Take 2 tablets (650 mg total) by mouth every 6 (six) hours as needed for mild pain (or Fever >/= 101). 05/07/18   Gouru, Aruna, MD  alum & mag hydroxide-simeth (MAALOX/MYLANTA) 200-200-20 MG/5ML suspension Take 30 mLs by mouth every 4 (four) hours as needed for indigestion. 05/07/18   Nicholes Mango, MD  aspirin EC 81 MG tablet Take 1 tablet (81 mg total) by mouth daily. 04/20/18 04/20/19  Epifanio Lesches, MD  B Complex-C-Folic Acid  (RENA-VITE PO) Take 1 tablet by mouth daily. 11/28/16   [provider]  budesonide-formoterol (SYMBICORT) 160-4.5 MCG/ACT inhaler Inhale 2 puffs into the lungs daily. 05/07/18   Nicholes Mango, MD  calcium acetate (PHOSLO) 667 MG capsule Take 2,001 mg by mouth 3 (three) times daily.    [provider]  docusate sodium (COLACE) 100 MG capsule Take 1 capsule (100 mg total) by mouth 2 (two) times daily as needed for mild constipation. 05/07/18   Nicholes Mango, MD  folic acid (FOLVITE) 1 MG tablet Take 1 tablet (1 mg total) by mouth daily. 10/31/17   Fritzi Mandes, MD  gabapentin (NEURONTIN) 300 MG capsule Take 300 mg by mouth 3 (three) times daily.  08/16/16   [provider]  ipratropium-albuterol (DUONEB) 0.5-2.5 (3) MG/3ML SOLN Take 3 mLs by nebulization every 6 (six) hours as needed. 05/07/18   Nicholes Mango, MD  labetalol (NORMODYNE) 100 MG tablet Take 100 mg by mouth daily.    [provider]  multivitamin (RENA-VIT) TABS tablet Take 1 tablet by mouth at bedtime. 10/31/17   Fritzi Mandes, MD  Nutritional Supplements (FEEDING SUPPLEMENT, NEPRO CARB STEADY,) LIQD Take 027  mLs by mouth 2 (two) times daily between meals. 05/07/18   Nicholes Mango, MD  omeprazole (PRILOSEC) 40 MG capsule Take 1 capsule (40 mg total) by mouth 2 (two) times daily. 05/07/18   Gouru, Illene Silver, MD  oxyCODONE (OXY IR/ROXICODONE) 5 MG immediate release tablet Take 1 tablet (5 mg total) by mouth every 6 (six) hours as needed for moderate pain. 05/07/18   Gouru, Illene Silver, MD  sucralfate (CARAFATE) 1 GM/10ML suspension Take 10 mLs (1 g total) by mouth 4 (four) times daily -  with meals and at bedtime. 05/07/18   Gouru, Illene Silver, MD  tiotropium (SPIRIVA HANDIHALER) 18 MCG inhalation capsule Place 1 capsule (18 mcg total) into inhaler and inhale daily. 05/07/18 08/05/18  Nicholes Mango, MD  vitamin C (VITAMIN C) 250 MG tablet Take 1 tablet (250 mg total) by mouth 2 (two) times daily. 04/20/18   Epifanio Lesches, MD      Allergies Patient has no known allergies.  Family History  Problem Relation Age of Onset  . Colon cancer Mother   . Cancer Father   . Cancer Sister   . Kidney disease Brother     Social History Social History   Tobacco Use  . Smoking status: Current Every Day Smoker    Packs/day: 0.15    Years: 40.00    Pack years: 6.00    Types: Cigarettes  . Smokeless tobacco: Never Used  Substance Use Topics  . Alcohol use: No    Comment: pt reports quitting after learning about cirrhosis  . Drug use: No    Frequency: 7.0 times per week    Types: Marijuana, Cocaine    Review of Systems  Constitutional: No fever/chills  Cardiovascular: Denies chest pain. Respiratory: Denies shortness of breath. Gastrointestinal: No abdominal pain.    Musculoskeletal: Negative for back pain.  Neurological: Negative for headaches    ____________________________________________   PHYSICAL EXAM:  VITAL SIGNS: ED Triage Vitals  Enc Vitals Group     BP 06/15/18 1446 90/60     Pulse Rate 06/15/18 1446 (!) 104     Resp 06/15/18 1446 18     Temp 06/15/18 1446 (!) 97.5 F (36.4 C)     Temp Source 06/15/18 1446 Oral     SpO2 06/15/18 1446 97 %     Weight 06/15/18 1448 75.8 kg (167 lb)     Height 06/15/18 1448 1.905 m (6\' 3" )     Head Circumference --      Peak Flow --      Pain Score 06/15/18 1448 9     Pain Loc --      Pain Edu? --      Excl. in [place]? --     Constitutional: Alert and oriented. No acute distress. Pleasant and interactive   Cardiovascular: Normal rate, regular rhythm. Grossly normal heart sounds.  Good peripheral circulation. Respiratory: Normal respiratory effort.  No retractions. Lungs CTAB.  Musculoskeletal: No lower extremity tenderness nor edema.  Warm and well perfused Neurologic:  Normal speech and language. No gross focal neurologic deficits are appreciated.  Skin:  Skin is warm, dry and intact. No rash noted. Psychiatric: Mood and affect are normal. Speech and  behavior are normal.  ____________________________________________   LABS (all labs ordered are listed, but only abnormal results are displayed)  Labs Reviewed - No data to display ____________________________________________  EKG  None ____________________________________________  RADIOLOGY  None ____________________________________________   PROCEDURES  Procedure(s) performed: No  Procedures   Critical Care performed: No  ____________________________________________   INITIAL IMPRESSION / ASSESSMENT AND PLAN / ED COURSE  Pertinent labs & imaging results that were available during my care of the patient were reviewed by me and considered in my medical decision making (see chart for details).  Dialysis and nephrology notified, patient seen by Dr. [name] in the emergency department.  He will go to dialysis promptly    ____________________________________________   FINAL CLINICAL IMPRESSION(S) / ED DIAGNOSES  Final diagnoses:  Encounter for dialysis [facility])        Note:  This document was prepared using Dragon voice recognition software and may include unintentional dictation errors.   Lavonia Drafts, MD 06/15/18 1655

## 2018-06-15 NOTE — Progress Notes (Signed)
HD Tx started    06/15/18 1800  Vital Signs  Pulse Rate 95  Pulse Rate Source Monitor  Resp (!) 22  BP 114/86  BP Location Left Arm  BP Method Automatic  Patient Position (if appropriate) Sitting  Oxygen Therapy  SpO2 100 %  O2 Device Room Air  During Hemodialysis Assessment  Blood Flow Rate (mL/min) 400 mL/min  Arterial Pressure (mmHg) -150 mmHg  Venous Pressure (mmHg) 160 mmHg  Transmembrane Pressure (mmHg) 60 mmHg  Ultrafiltration Rate (mL/min) 1000 mL/min  Dialysate Flow Rate (mL/min) 800 ml/min  Conductivity: Machine  13.5  HD Safety Checks Performed Yes  Dialysis Fluid Bolus Normal Saline  Bolus Amount (mL) 250 mL  Intra-Hemodialysis Comments Tx initiated

## 2018-06-15 NOTE — Progress Notes (Signed)
Post HD Tx    06/15/18 2103  Vital Signs  Temp 97.8 F (36.6 C)  Temp Source Oral  Pulse Rate (!) 102  Pulse Rate Source Monitor  Resp 16  BP 125/72  BP Location Left Arm  BP Method Automatic  Patient Position (if appropriate) Sitting  Oxygen Therapy  SpO2 100 %  O2 Device Room Air  Pain Assessment  Pain Scale 0-10  Pain Score 0  Dialysis Weight  Weight 74.1 kg  Type of Weight Post-Dialysis  Post-Hemodialysis Assessment  Rinseback Volume (mL) 250 mL  KECN 64 V  Dialyzer Clearance Lightly streaked  Duration of HD Treatment -hour(s) 3 hour(s)  Hemodialysis Intake (mL) 500 mL  UF Total -Machine (mL) 3000 mL  Net UF (mL) 2500 mL  Tolerated HD Treatment Yes  Hemodialysis Catheter Right Internal jugular Double-lumen  No Placement Date or Time found.   Placed prior to admission: Yes  Orientation: Right  Access Location: Internal jugular  Hemodialysis Catheter Type: Double-lumen  Site Condition No complications  Catheter fill solution Heparin 1000 units/ml  Catheter fill volume (Arterial) 1.5 cc  Catheter fill volume (Venous) 1.5  Post treatment catheter status Capped and Clamped

## 2018-06-15 NOTE — ED Provider Notes (Signed)
-----------------------------------------   9:32 PM on 06/15/2018 -----------------------------------------   Patient returned from dialysis.  Reports that he feels normal and denies any symptoms.  No chest pain shortness of breath or belly pain.  Abdomen is soft nontender, lungs clear to auscultation bilaterally.  Regular rate and rhythm.  Stable for discharge home in apparent baseline state of health.   Carrie Mew, MD 06/15/18 2133

## 2018-06-15 NOTE — Progress Notes (Signed)
Pre HD Assessement, pt is tachy, asymptomatic, denies chest pain, is having pain in foot MD notified and aware. Pt is stable for tx.    06/15/18 1750  Neurological  Level of Consciousness Alert  Orientation Level Oriented X4  Respiratory  Respiratory Pattern Regular  Chest Assessment Chest expansion symmetrical  Bilateral Breath Sounds Clear;Diminished  Cough None  Cardiac  Pulse Irregular  Heart Sounds S1, S2  ECG Monitor Yes  Cardiac Rhythm ST  Vascular  R Radial Pulse +2  L Radial Pulse +2  Edema Generalized  Generalized Edema +1  Psychosocial  Psychosocial (WDL) WDL  Patient Behaviors Cooperative  Emotional support given Given to patient

## 2018-06-15 NOTE — Progress Notes (Signed)
Pre HD Tx   06/15/18 1759  Vital Signs  Temp 97.8 F (36.6 C)  Temp Source Oral  Pulse Rate (!) 106  Pulse Rate Source Monitor  Resp (!) 22  BP 108/74  BP Location Left Arm  BP Method Automatic  Patient Position (if appropriate) Sitting  Oxygen Therapy  SpO2 100 %  O2 Device Room Air  Pain Assessment  Pain Scale 0-10  Pain Score 9  Pain Location Foot  Pain Orientation Left  Pain Intervention(s) MD notified (Comment) (ordered pain medication, awaiting delivery from pharmacy )  Dialysis Weight  Weight 75.7 kg  Type of Weight Pre-Dialysis  Time-Out for Hemodialysis  What Procedure? HD  Pt Identifiers(min of two) First/Last Name;MRN/Account#  Correct Site? Yes  Correct Side? Yes  Correct Procedure? Yes  Consents Verified? Yes  Rad Studies Available? N/A  Safety Precautions Reviewed? Yes  Engineer, civil (consulting) Number 1  Station Number 3  UF/Alarm Test Passed  Conductivity: Meter 14  Conductivity: Machine  14  pH 7.2  Reverse Osmosis Main  Normal Saline Lot Number [id number]  Dialyzer Lot Number [id number]  Disposable Set Lot Number [id number]  Machine Temperature 98.6 F (37 C)  Musician and Audible Yes  Blood Lines Intact and Secured Yes  Pre Treatment Patient Checks  Vascular access used during treatment Catheter  Patient is receiving dialysis in a chair Yes  Hepatitis B Surface Antigen Results Negative  Date Hepatitis B Surface Antigen Drawn 06/12/18  Hepatitis B Surface Antibody  (>10)  Date Hepatitis B Surface Antibody Drawn 06/12/18  Hemodialysis Consent Verified Yes  Hemodialysis Standing Orders Initiated Yes  ECG (Telemetry) Monitor On Yes  Prime Ordered Normal Saline  Length of  DialysisTreatment -hour(s) 3 Hour(s)  Dialysis Treatment Comments Na 140  Dialyzer Elisio 17H NR  Dialysate 1K  Dialysis Anticoagulant None  Dialysate Flow Ordered 800  Blood Flow Rate Ordered 400 mL/min  Ultrafiltration Goal 2.5 Liters  Dialysis Blood Pressure  Support Ordered Normal Saline  Education / Care Plan  Dialysis Education Provided Yes  Documented Education in Care Plan Yes  Hemodialysis Catheter Right Internal jugular Double-lumen  No Placement Date or Time found.   Placed prior to admission: Yes  Orientation: Right  Access Location: Internal jugular  Hemodialysis Catheter Type: Double-lumen  Site Condition No complications  Blue Lumen Status Flushed  Red Lumen Status Flushed  Dressing Type Occlusive;Biopatch  Dressing Status Clean;Dry;Intact

## 2018-06-15 NOTE — ED Triage Notes (Signed)
Pt here for dialysis, states last treatment on Wednesday, states that he feels weak

## 2018-06-15 NOTE — Progress Notes (Signed)
[facility]  ROUNDING NOTE   Subjective:   Presents to ER for evaluation of uremia and to undergo hemodialysis  Denies any acute shortness of breath, fever, cough Last potassium was elevated at 6.3.  Hemoglobin was 6.5 Patient denies any acute bleeding Complain of itching and pain in the foot  has increased abdominal distension and LE edema today     Objective:  Vital signs in last 24 hours:  Temp:  [97.5 F (36.4 C)] 97.5 F (36.4 C) (04/03 1446) Pulse Rate:  [104] 104 (04/03 1446) Resp:  [18] 18 (04/03 1446) BP: (90)/(60) 90/60 (04/03 1446) SpO2:  [97 %] 97 % (04/03 1446) Weight:  [75.8 kg] 75.8 kg (04/03 1448)  Weight change:  Filed Weights   06/15/18 1448  Weight: 75.8 kg    Intake/Output: No intake/output data recorded.   Intake/Output this shift:  No intake/output data recorded.  Physical Exam: General: NAD, laying in bed  Head: Normocephalic, atraumatic. Moist oral mucosal membranes  Eyes: Anicteric,  Neck: Supple, trachea midline  Lungs:  Clear to auscultation  Heart: Regular rate and rhythm  Abdomen:  Soft, nontender, +ascites  Extremities: ++ peripheral edema.  Neurologic: Nonfocal, moving all four extremities  Skin: No lesions  Access: RIJ permcath    Basic Metabolic Panel: Recent Labs  Lab 06/11/18 1631 06/13/18 1158  NA 136 136  K 6.3* 6.3*  CL 99 100  CO2 29 28  GLUCOSE 89 117*  BUN 38* 38*  CREATININE 7.21* 7.05*  CALCIUM 8.5* 8.6*  PHOS 2.1*  --     Liver Function Tests: Recent Labs  Lab 06/11/18 1631  ALBUMIN 2.7*   No results for input(s): LIPASE, AMYLASE in the last 168 hours. No results for input(s): AMMONIA in the last 168 hours.  CBC: Recent Labs  Lab 06/11/18 1631 06/13/18 1158  WBC 6.0 5.7  HGB 6.5* 6.5*  HCT 23.6* 23.8*  MCV 84.6 83.5  PLT 345 367    Cardiac Enzymes: No results for input(s): CKTOTAL, CKMB, CKMBINDEX, TROPONINI in the last 168 hours.  BNP: Invalid input(s):  POCBNP  CBG: No results for input(s): GLUCAP in the last 168 hours.  Microbiology: Results for orders placed or performed during the hospital encounter of 04/26/18  MRSA PCR Screening     Status: None   Collection Time: 04/26/18  1:09 PM  Result Value Ref Range Status   MRSA by PCR NEGATIVE NEGATIVE Final    Comment:        The GeneXpert MRSA Assay (FDA approved for NASAL specimens only), is one component of a comprehensive MRSA colonization surveillance program. It is not intended to diagnose MRSA infection nor to guide or monitor treatment for MRSA infections. Performed at [facility], [address] [place], [place] [address]     Coagulation Studies: No results for input(s): LABPROT, INR in the last 72 hours.  Urinalysis: No results for input(s): COLORURINE, LABSPEC, PHURINE, GLUCOSEU, HGBUR, BILIRUBINUR, KETONESUR, PROTEINUR, UROBILINOGEN, NITRITE, LEUKOCYTESUR in the last 72 hours.  Invalid input(s): APPERANCEUR    Imaging: No results found.   Medications:       Assessment/ Plan:  Mr. Kalman Nylen is a 58 y.o. black male withend stage renal disease on hemodialysis secondary to Alport's syndrome, end stage liver disease with ascites, hypertension, peripheral vascular disease, coronary artery disease, hyperlipidemia, gastrointestinal AVMs, pulmonary hypertension  [facility])RIJ permcath.210 min,currently no transportation to dialysis unit  1. Hyperkalemia:   - ordered hemodialysis treatment. 2K bath  2. End Stage Renal Disease with uremia and volume overload: on hemodialysis.  Placed on hemodialysis treatment.  Discussed with patient.  He is willing to go to 1 of the outpatient dialysis units.  We will reach out to outpatient centers to see if he would be a candidate for placement.  3. Anemia of chronic kidney disease: hemoglobin 6.5 - EPO with HD treatment.   4. Secondary Hyperparathyroidism:  phosphorus at  goal  5. Ascites:  no indication for paracentesis today.    LOS: 0 Starlina Lapre 4/3/20203:50 PM

## 2018-06-19 DIAGNOSIS — I499 Cardiac arrhythmia, unspecified: Secondary | ICD-10-CM | POA: Diagnosis not present

## 2018-06-19 DIAGNOSIS — E162 Hypoglycemia, unspecified: Secondary | ICD-10-CM | POA: Diagnosis not present

## 2018-06-19 DIAGNOSIS — R404 Transient alteration of awareness: Secondary | ICD-10-CM | POA: Diagnosis not present

## 2018-06-19 DIAGNOSIS — R4020 Unspecified coma: Secondary | ICD-10-CM | POA: Diagnosis not present

## 2018-06-19 DIAGNOSIS — E161 Other hypoglycemia: Secondary | ICD-10-CM | POA: Diagnosis not present

## 2018-07-13 DEATH — deceased

## 2018-07-20 ENCOUNTER — Telehealth: Payer: Self-pay

## 2018-07-20 NOTE — Telephone Encounter (Signed)
Called patient from recall list.  No answer.  "number not reachable" This is the second attempt per recall list.

## 2018-07-21 IMAGING — US US PARACENTESIS
1 series · 6 of 6 positions shown · non-contrast
Comparison: none

INDICATION: Cirrhosis, end-stage renal disease and recurrent ascites.

[Series 1: us paracentesis · 0.28mm/px · 6 of 6 slices shown]
[im 1/6]
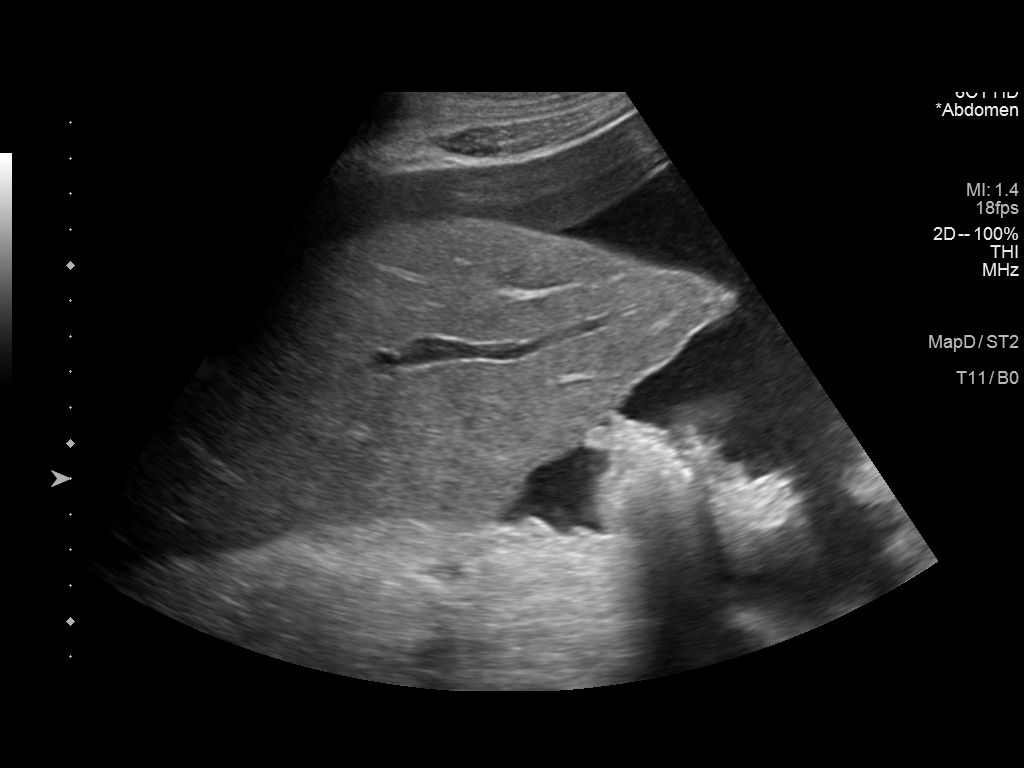
[im 2/6]
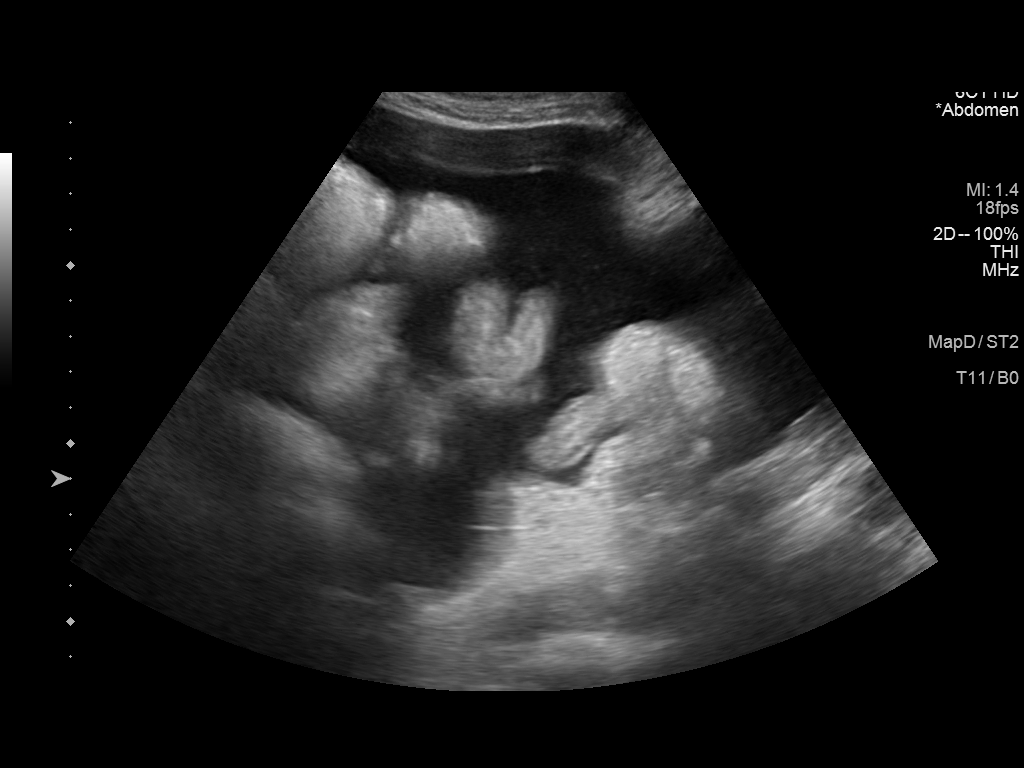
[im 3/6]
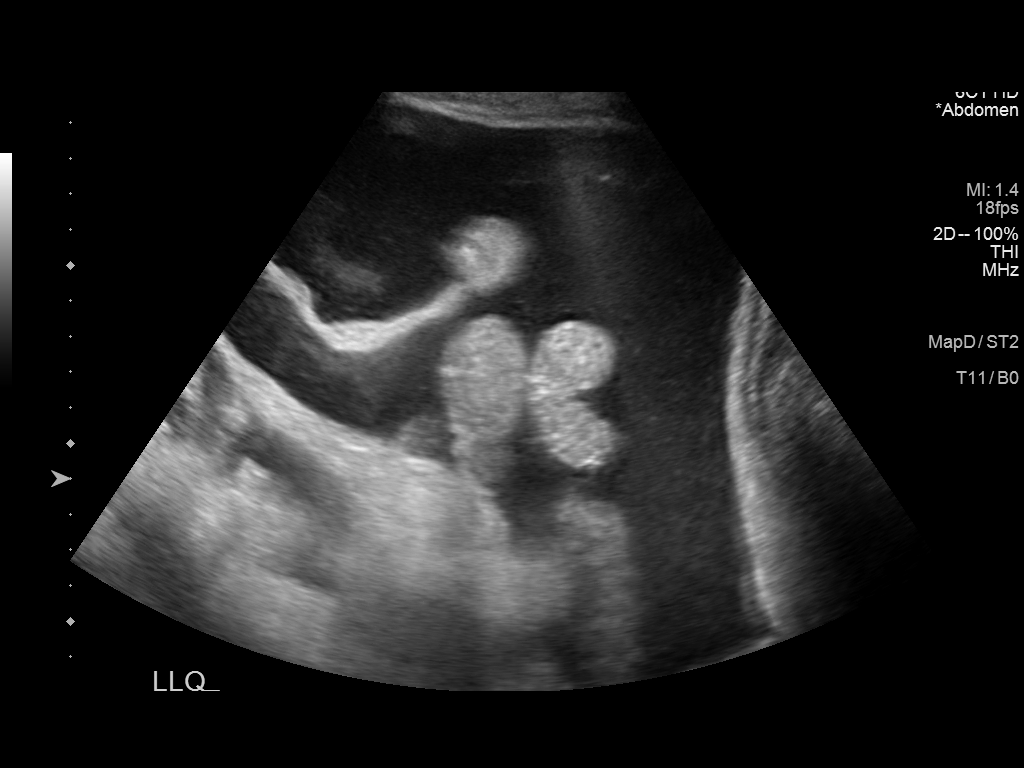
[im 4/6]
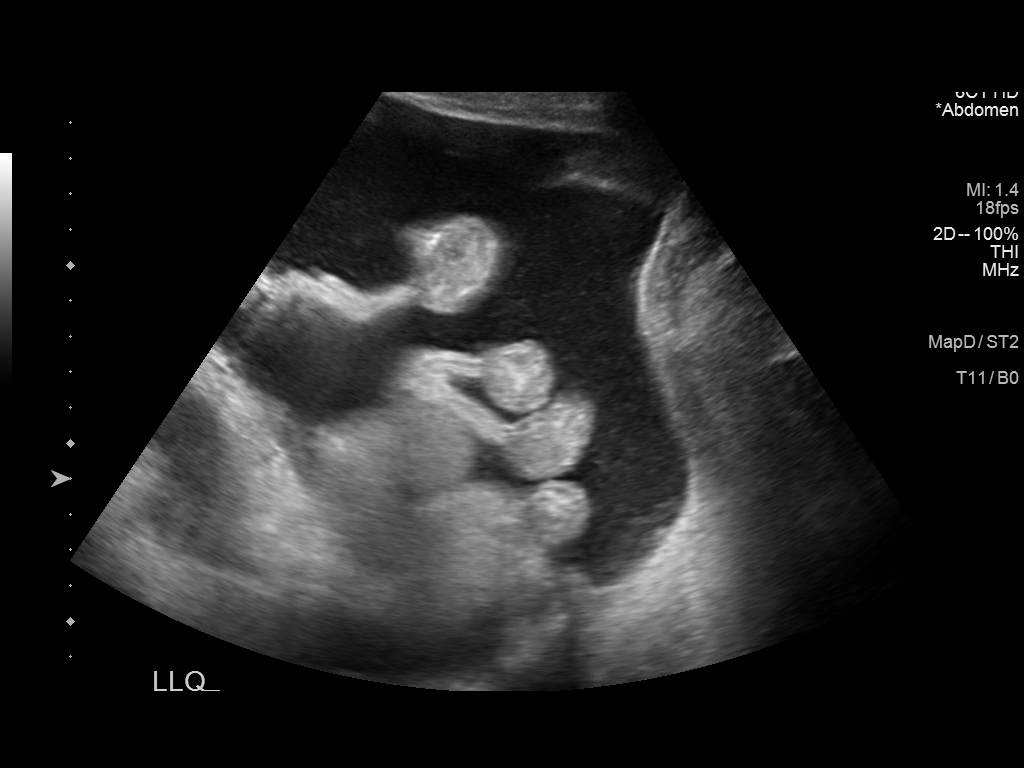
[im 5/6]
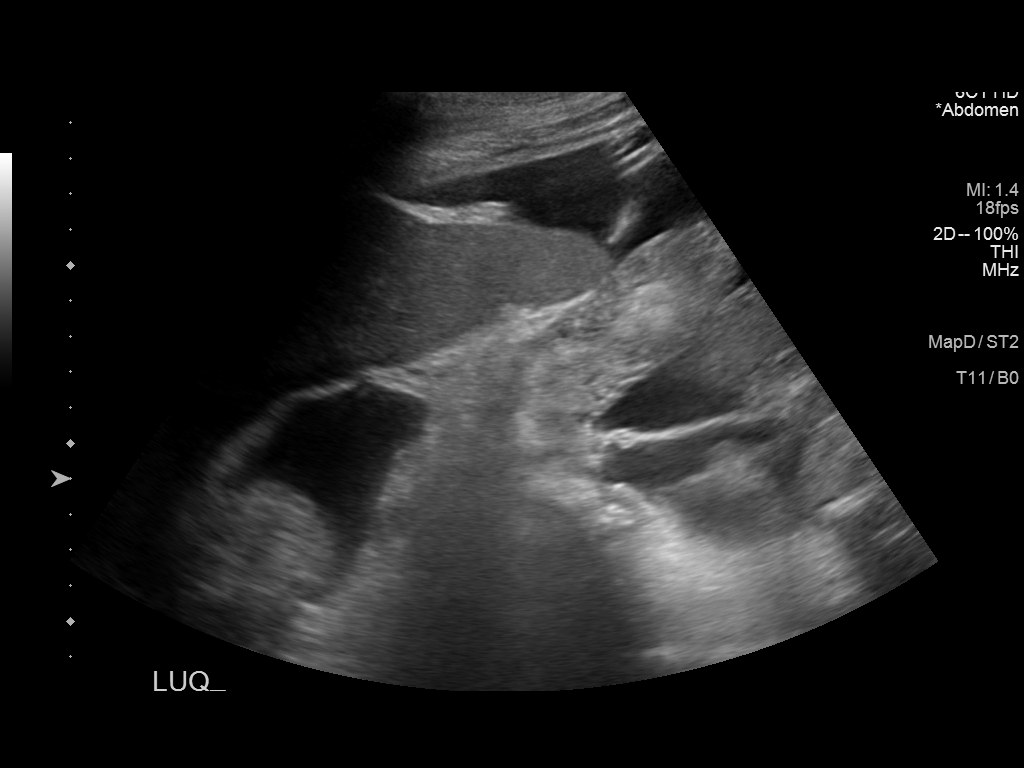
[im 6/6]
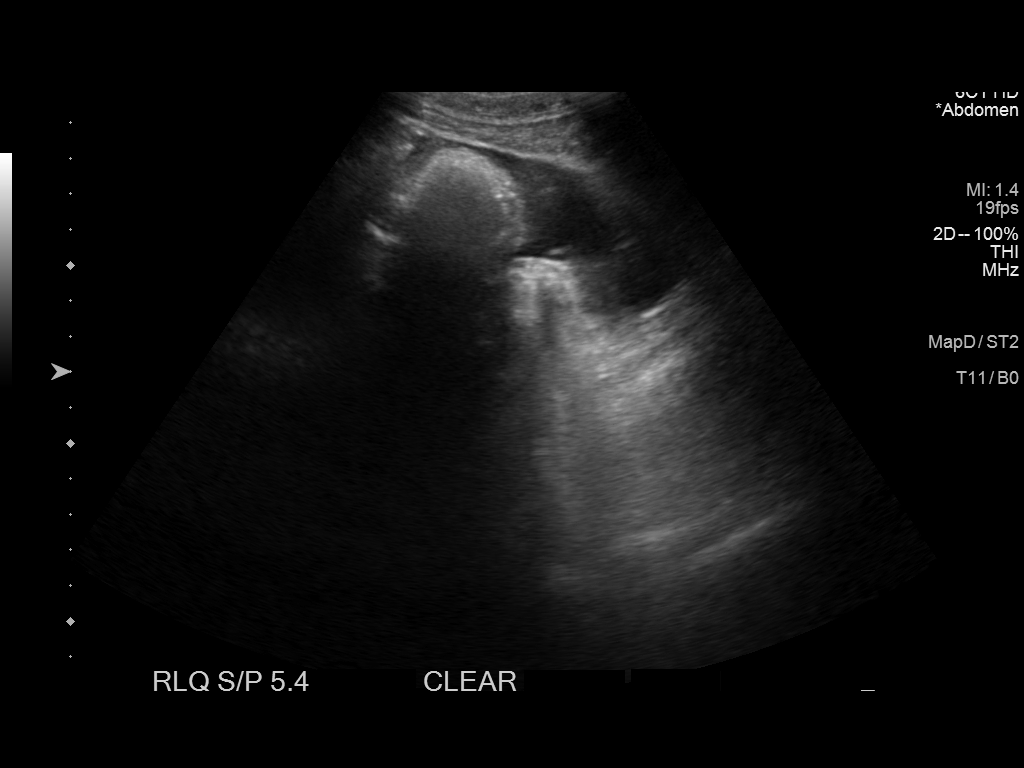

[6 of 6 positions shown; findings below may reference images not displayed]

EXAM:
ULTRASOUND GUIDED PARACENTESIS

MEDICATIONS:
None.

COMPLICATIONS:
None immediate.

PROCEDURE:
Informed written consent was obtained from the patient after a
discussion of the risks, benefits and alternatives to treatment. A
timeout was performed prior to the initiation of the procedure.

Initial ultrasound scanning was performed to localize ascites. The
right lower abdomen was prepped and draped in the usual sterile
fashion. 1% lidocaine was used for local anesthesia.

Following this, a 6 Fr Safe-T-Centesis catheter was introduced. An
ultrasound image was saved for documentation purposes. The
paracentesis was performed. The catheter was removed and a dressing
was applied. The patient tolerated the procedure well without
immediate post procedural complication.
FINDINGS: A total of approximately 5.4 L of clear, yellowish fluid was
removed.
IMPRESSION: Successful ultrasound-guided paracentesis yielding 5.4 liters of
peritoneal fluid.

## 2018-08-02 NOTE — Telephone Encounter (Signed)
Called patient from recall list.  "number not reachable" This is the third attempt per recall.  Will delete recall.

## 2018-08-11 IMAGING — DX DG CHEST 1V PORT
1 series · 1 of 1 positions shown · non-contrast
Comparison: 08/16/2016

CLINICAL DATA: 56-year-old male with a history of anemia

EXAM:
PORTABLE CHEST 1 VIEW

[chest ap]
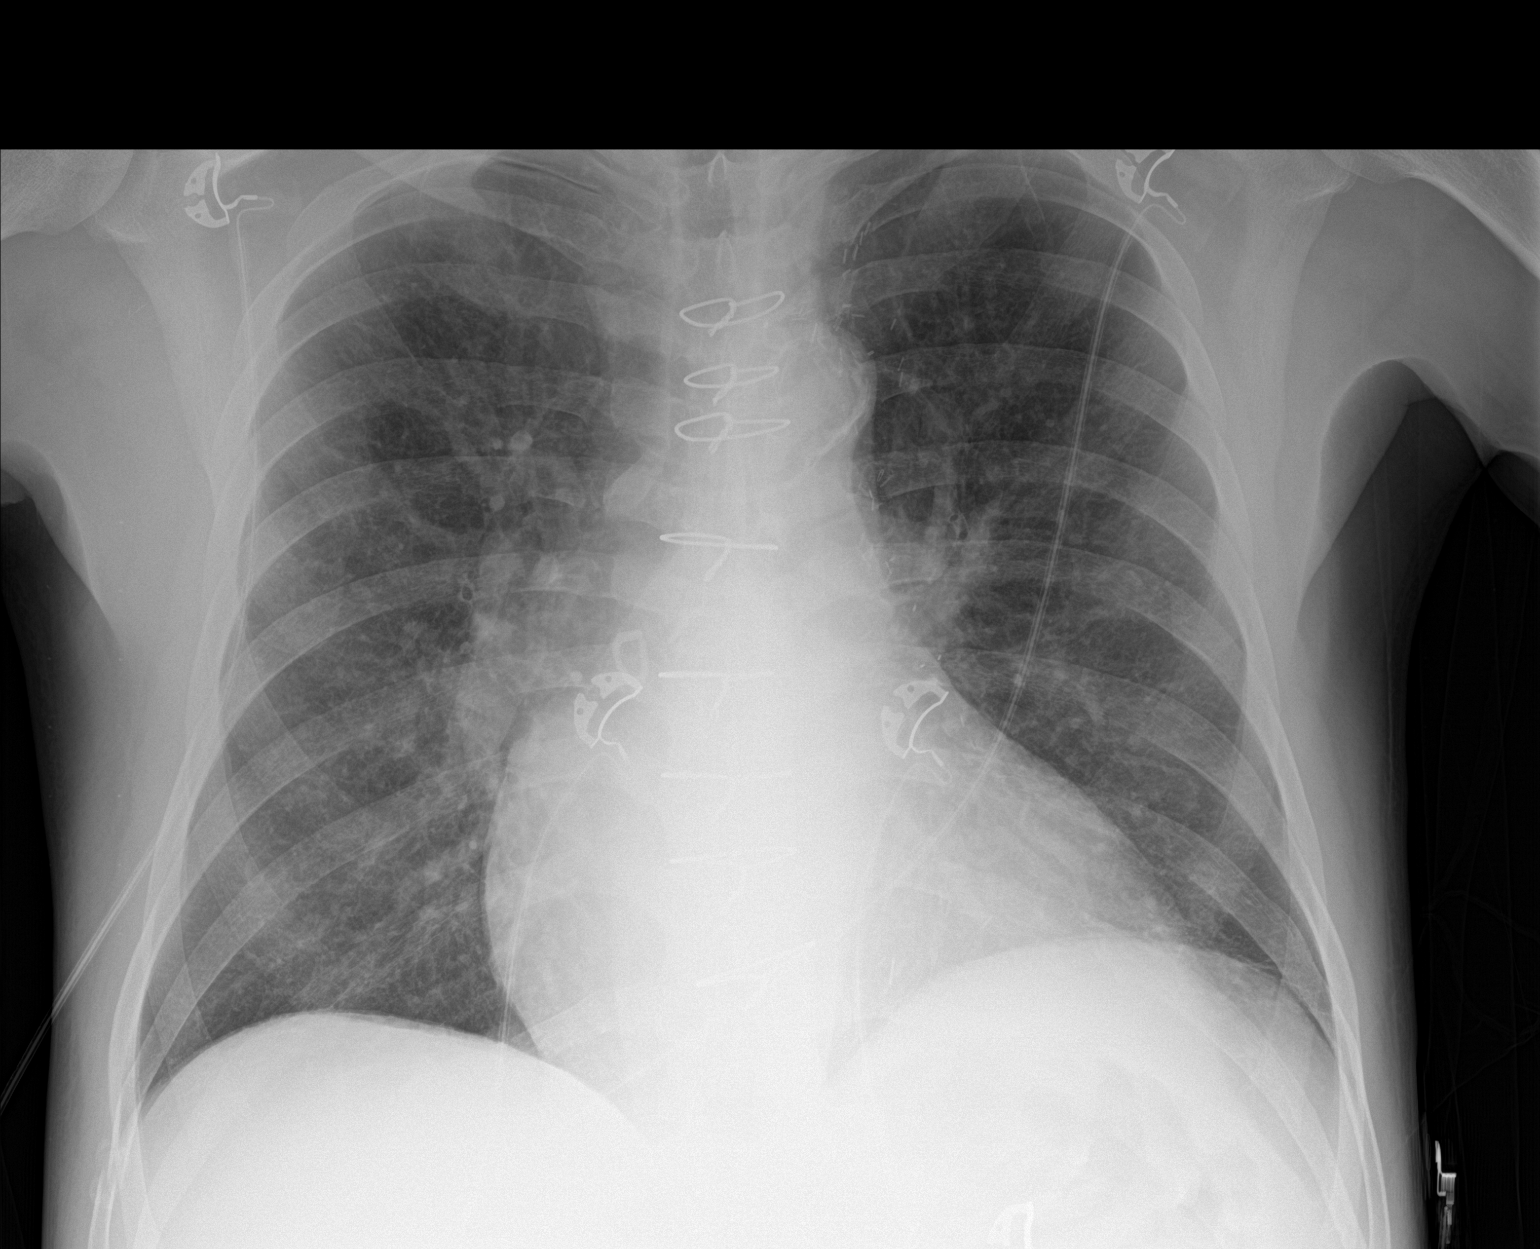

[1 of 1 positions shown; findings below may reference images not displayed]

FINDINGS: Cardiomediastinal silhouette unchanged in size and contour. Surgical
changes of median sternotomy and CABG. Calcifications of the aortic
arch.

No pneumothorax. No pleural effusion. No confluent airspace disease.
Coarsened interstitial markings, similar to prior.

No displaced fracture.
IMPRESSION: Chronic lung changes without evidence of acute cardiopulmonary
disease.

Surgical changes of prior median sternotomy and CABG.

## 2018-08-12 IMAGING — US US PARACENTESIS
1 series · 9 of 9 positions shown · non-contrast
Comparison: none

INDICATION: Patient with recurrent ascites. Request is made for therapeutic
paracentesis.

[Series 1: us paracentesis · 0.26mm/px · 9 of 9 slices shown]
[im 1/9]
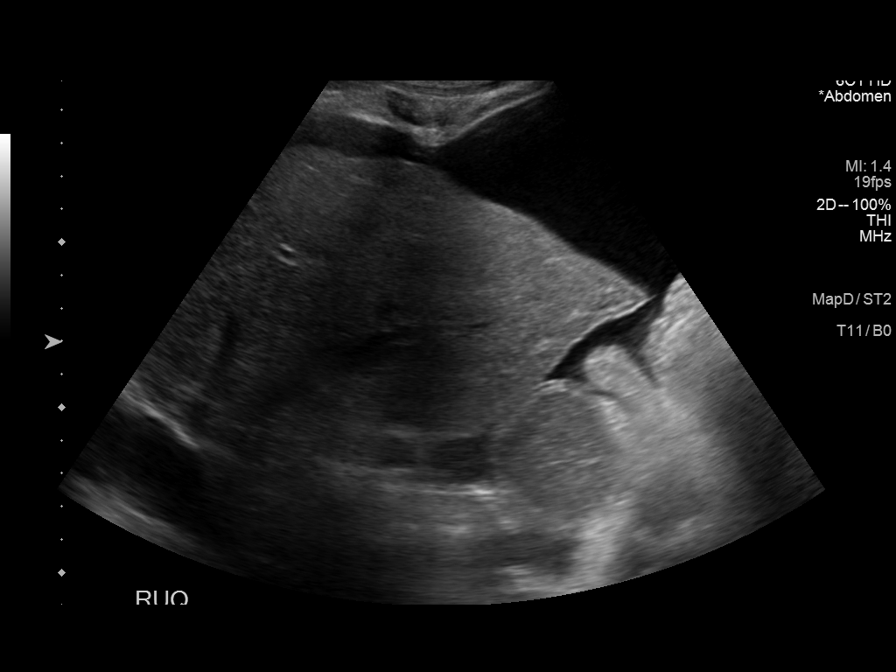
[im 2/9]
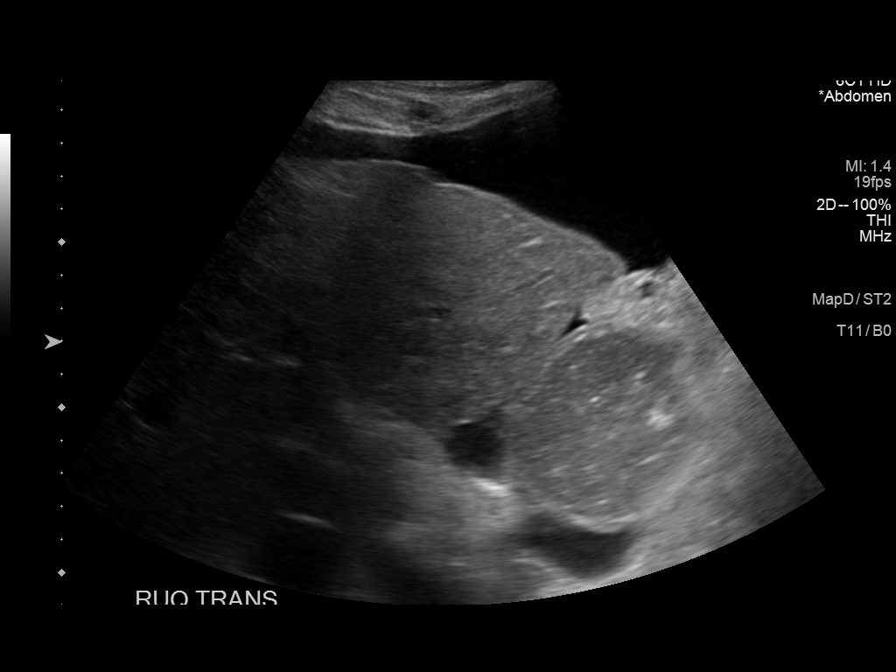
[im 3/9]
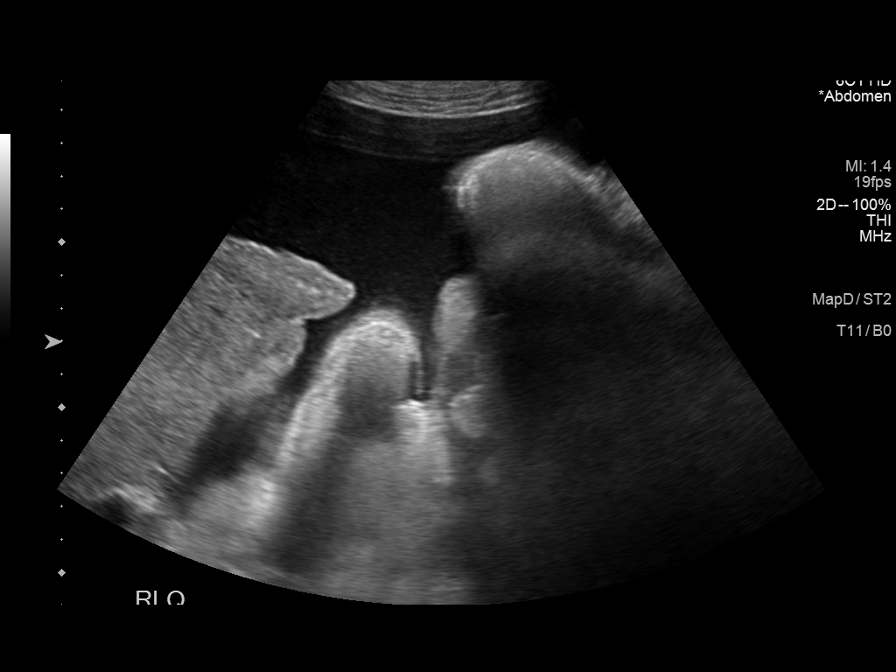
[im 4/9]
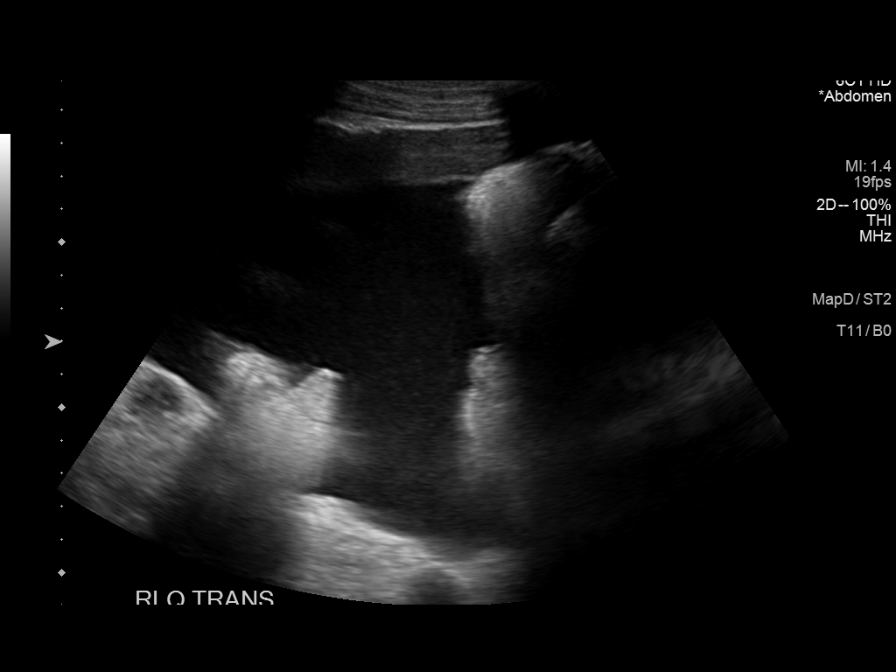
[im 5/9]
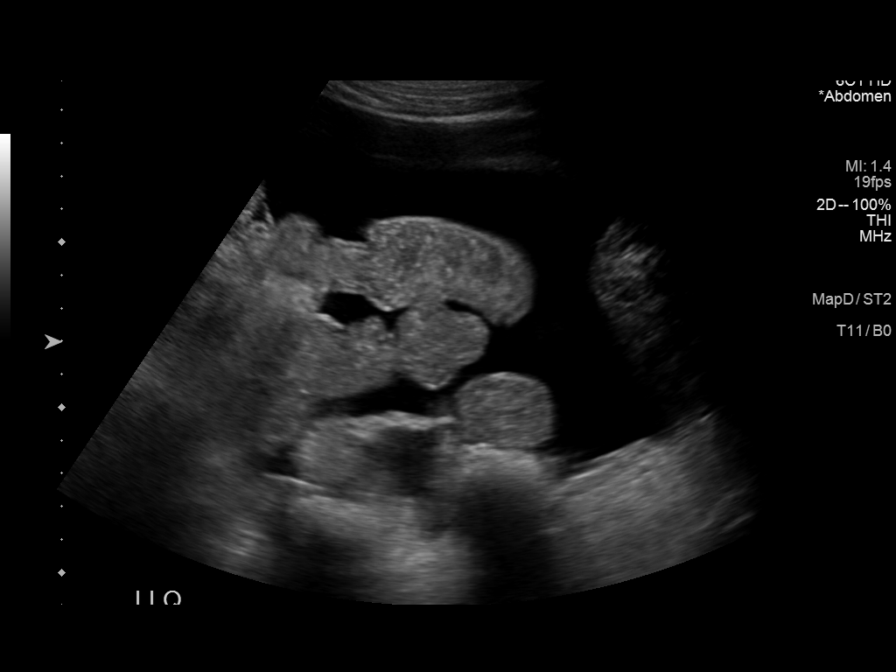
[im 6/9]
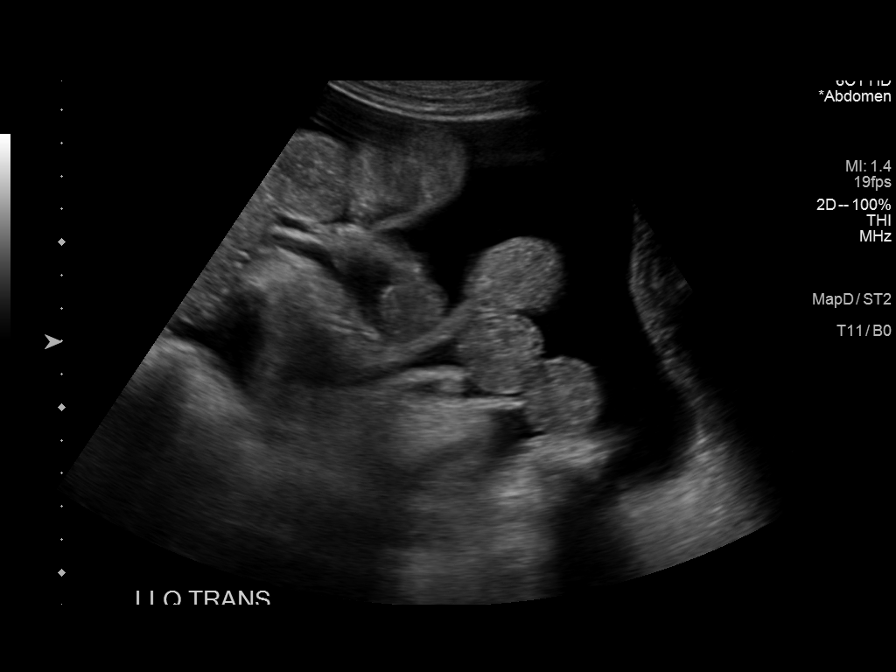
[im 7/9]
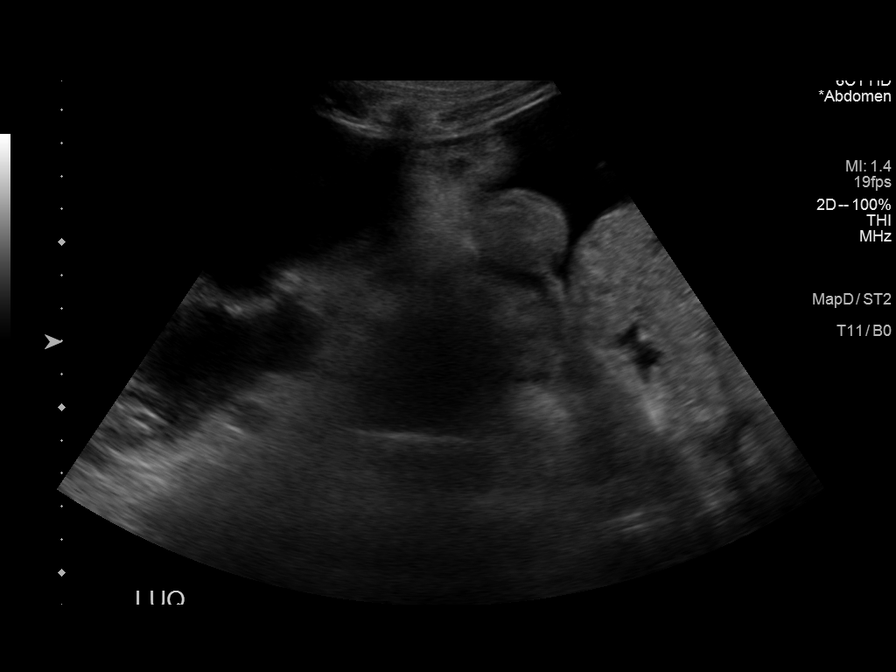
[im 8/9]
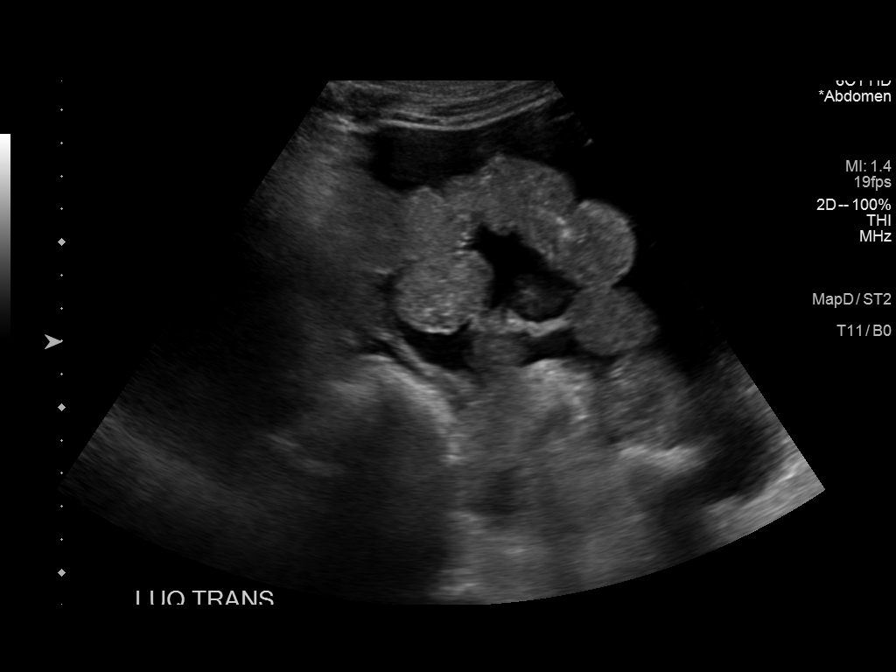
[im 9/9]
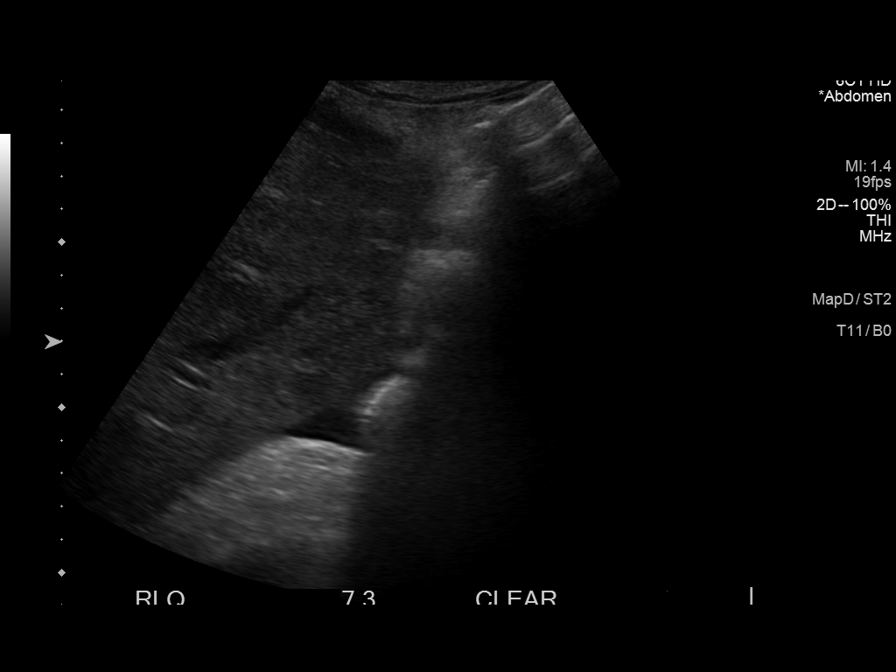

[9 of 9 positions shown; findings below may reference images not displayed]

EXAM:
ULTRASOUND GUIDED THERAPEUTIC PARACENTESIS

MEDICATIONS:
10 mL 1% lidocaine

COMPLICATIONS:
None immediate.

PROCEDURE:
Informed written consent was obtained from the patient after a
discussion of the risks, benefits and alternatives to treatment. A
timeout was performed prior to the initiation of the procedure.

Initial ultrasound scanning demonstrates a large amount of ascites
within the right lateral abdomen. The right lateral abdomen was
prepped and draped in the usual sterile fashion. 1% lidocaine was
used for local anesthesia.

Following this, a Safe-T-Centesis catheter was introduced. An
ultrasound image was saved for documentation purposes. The
paracentesis was performed. The catheter was removed and a dressing
was applied. The patient tolerated the procedure well without
immediate post procedural complication.
FINDINGS: A total of approximately 7.3 liters of clear, yellow fluid was
removed.
IMPRESSION: Successful ultrasound-guided therapeutic paracentesis yielding
liters of peritoneal fluid.

## 2018-08-22 IMAGING — CR DG CHEST 2V
2 series · 2 of 2 positions shown · non-contrast
Comparison: 05/04/2015

CLINICAL DATA: Cough

EXAM:
CHEST  2 VIEW

[chest pa]
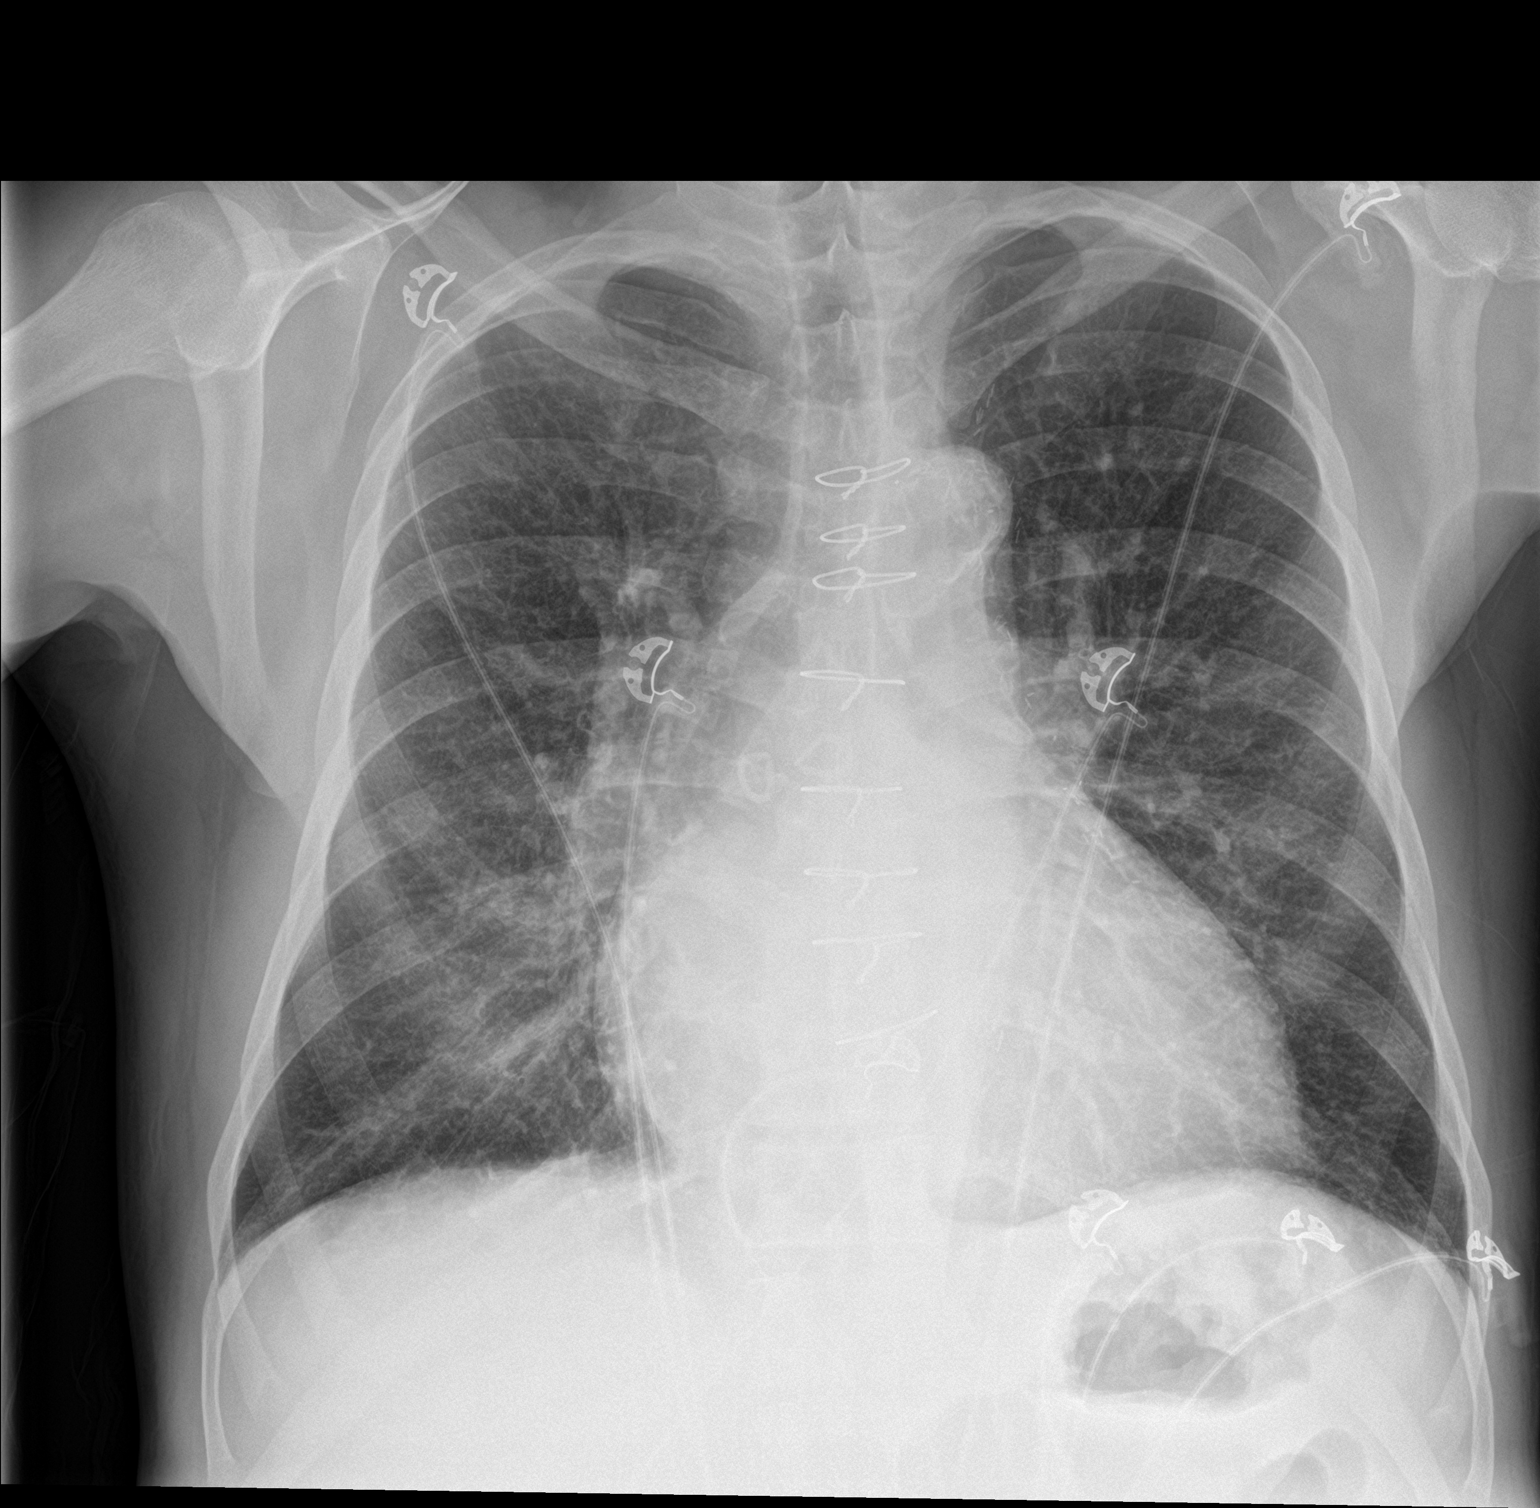

[chest lat]
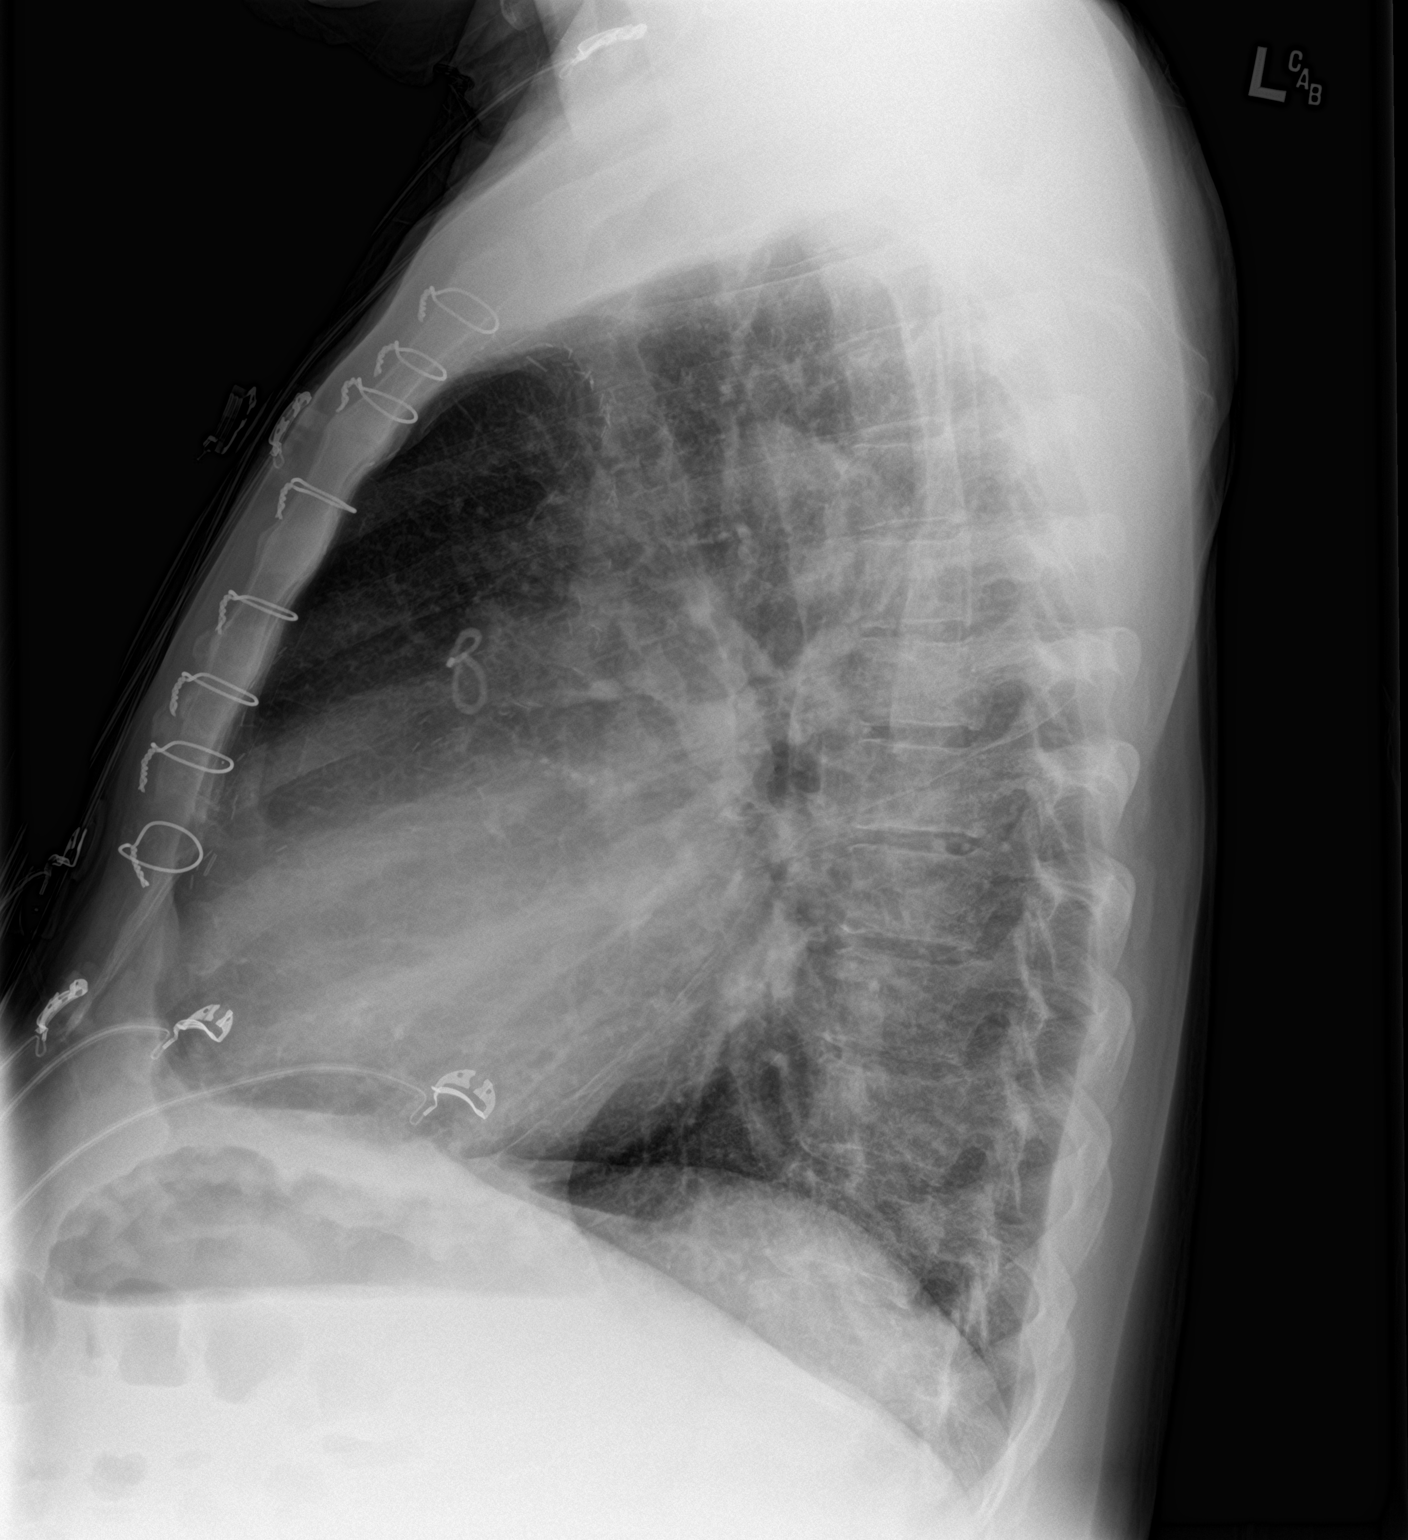

[2 of 2 positions shown; findings below may reference images not displayed]

FINDINGS: Borderline cardiomegaly. Status post CABG. Central mild vascular
congestion without convincing pulmonary edema. No segmental
infiltrate.
IMPRESSION: Borderline cardiomegaly. Central vascular congestion without
convincing pulmonary edema. No segmental infiltrate.

## 2018-08-31 IMAGING — CR DG CHEST 2V
1 series · 2 of 2 positions shown · non-contrast
Comparison: 01/19/2016

CLINICAL DATA: Shortness of breath.  Dialysis today.

EXAM:
CHEST  2 VIEW

[Series 1: dg chest 2 view · 0.14mm/px · 2 of 2 slices shown]
[im 1/2]
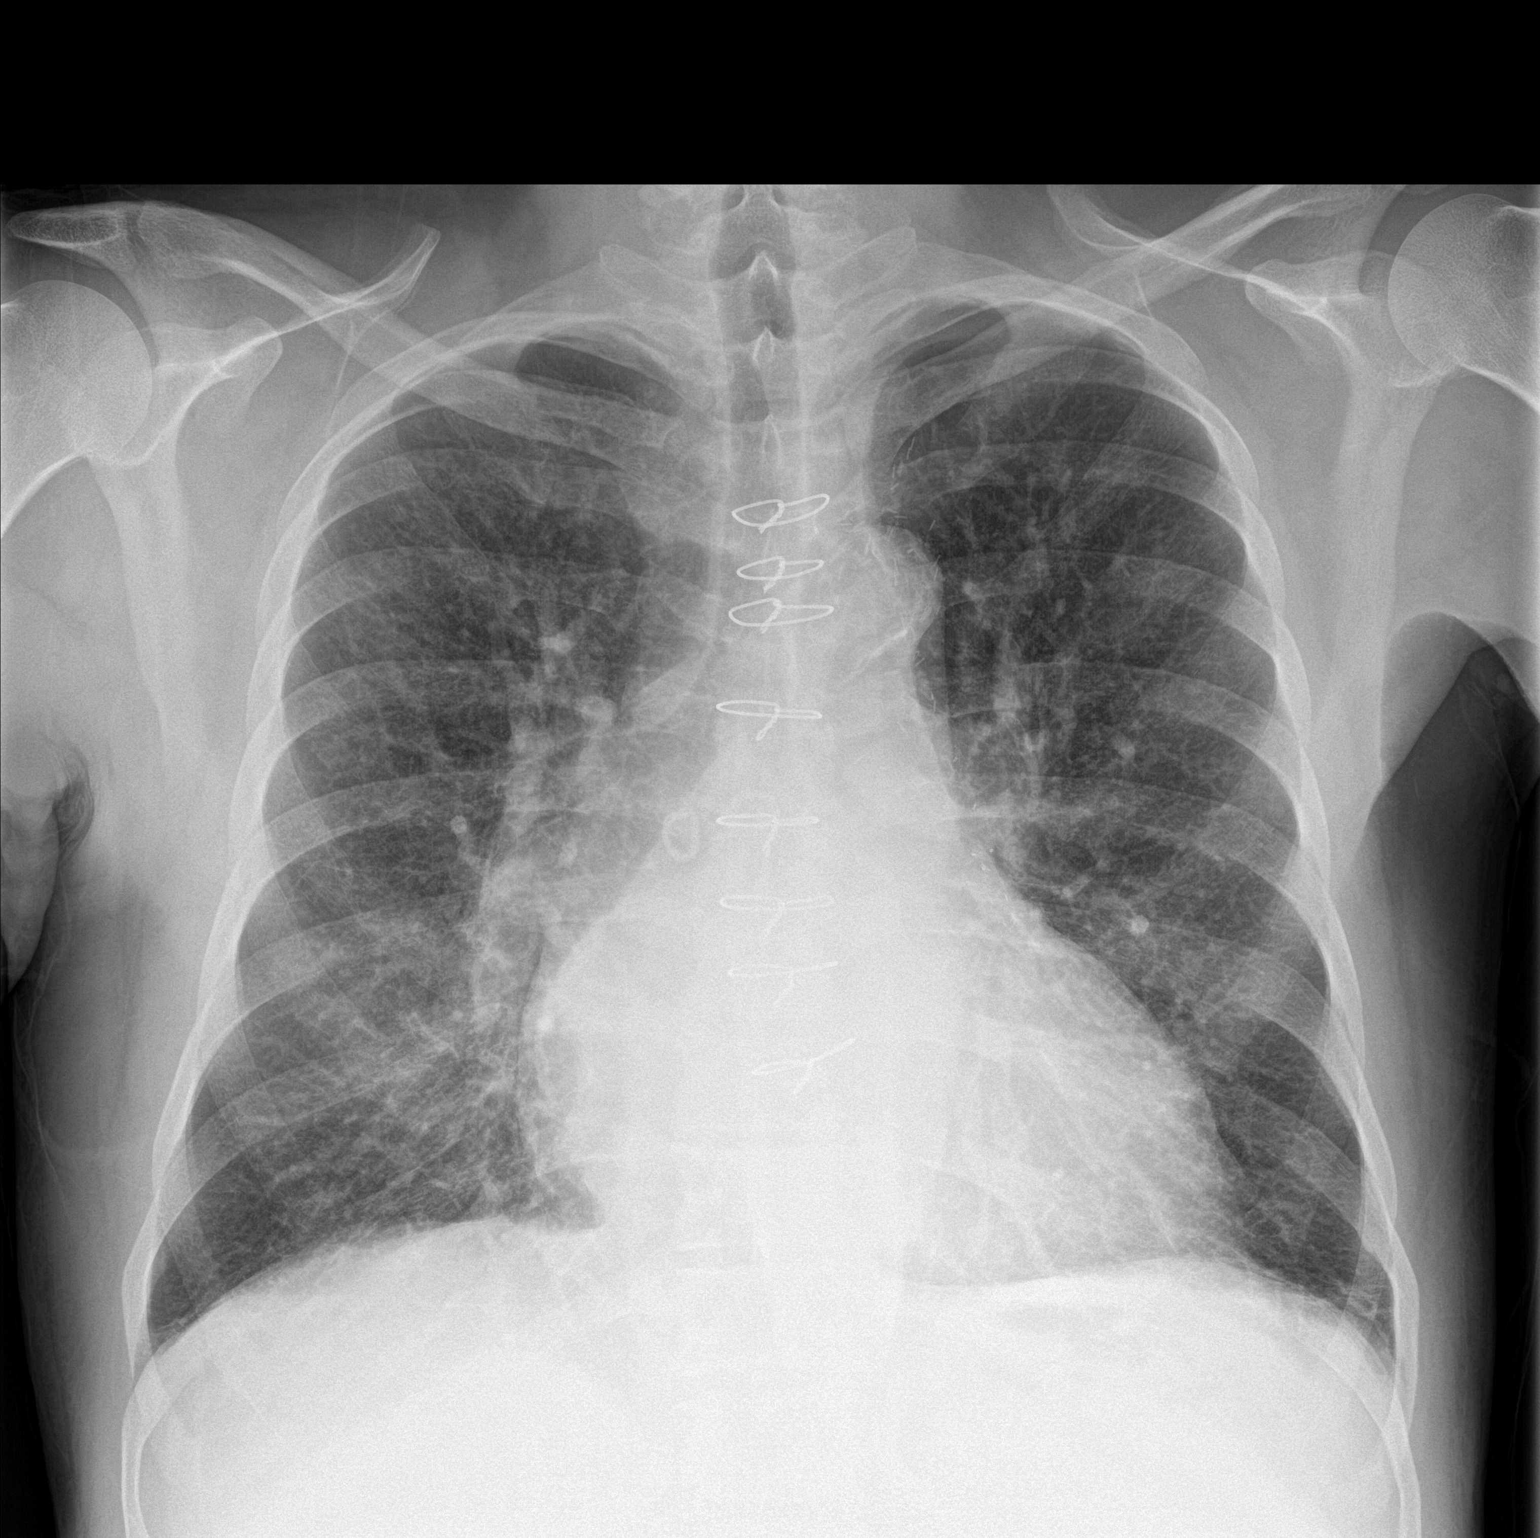
[im 2/2]
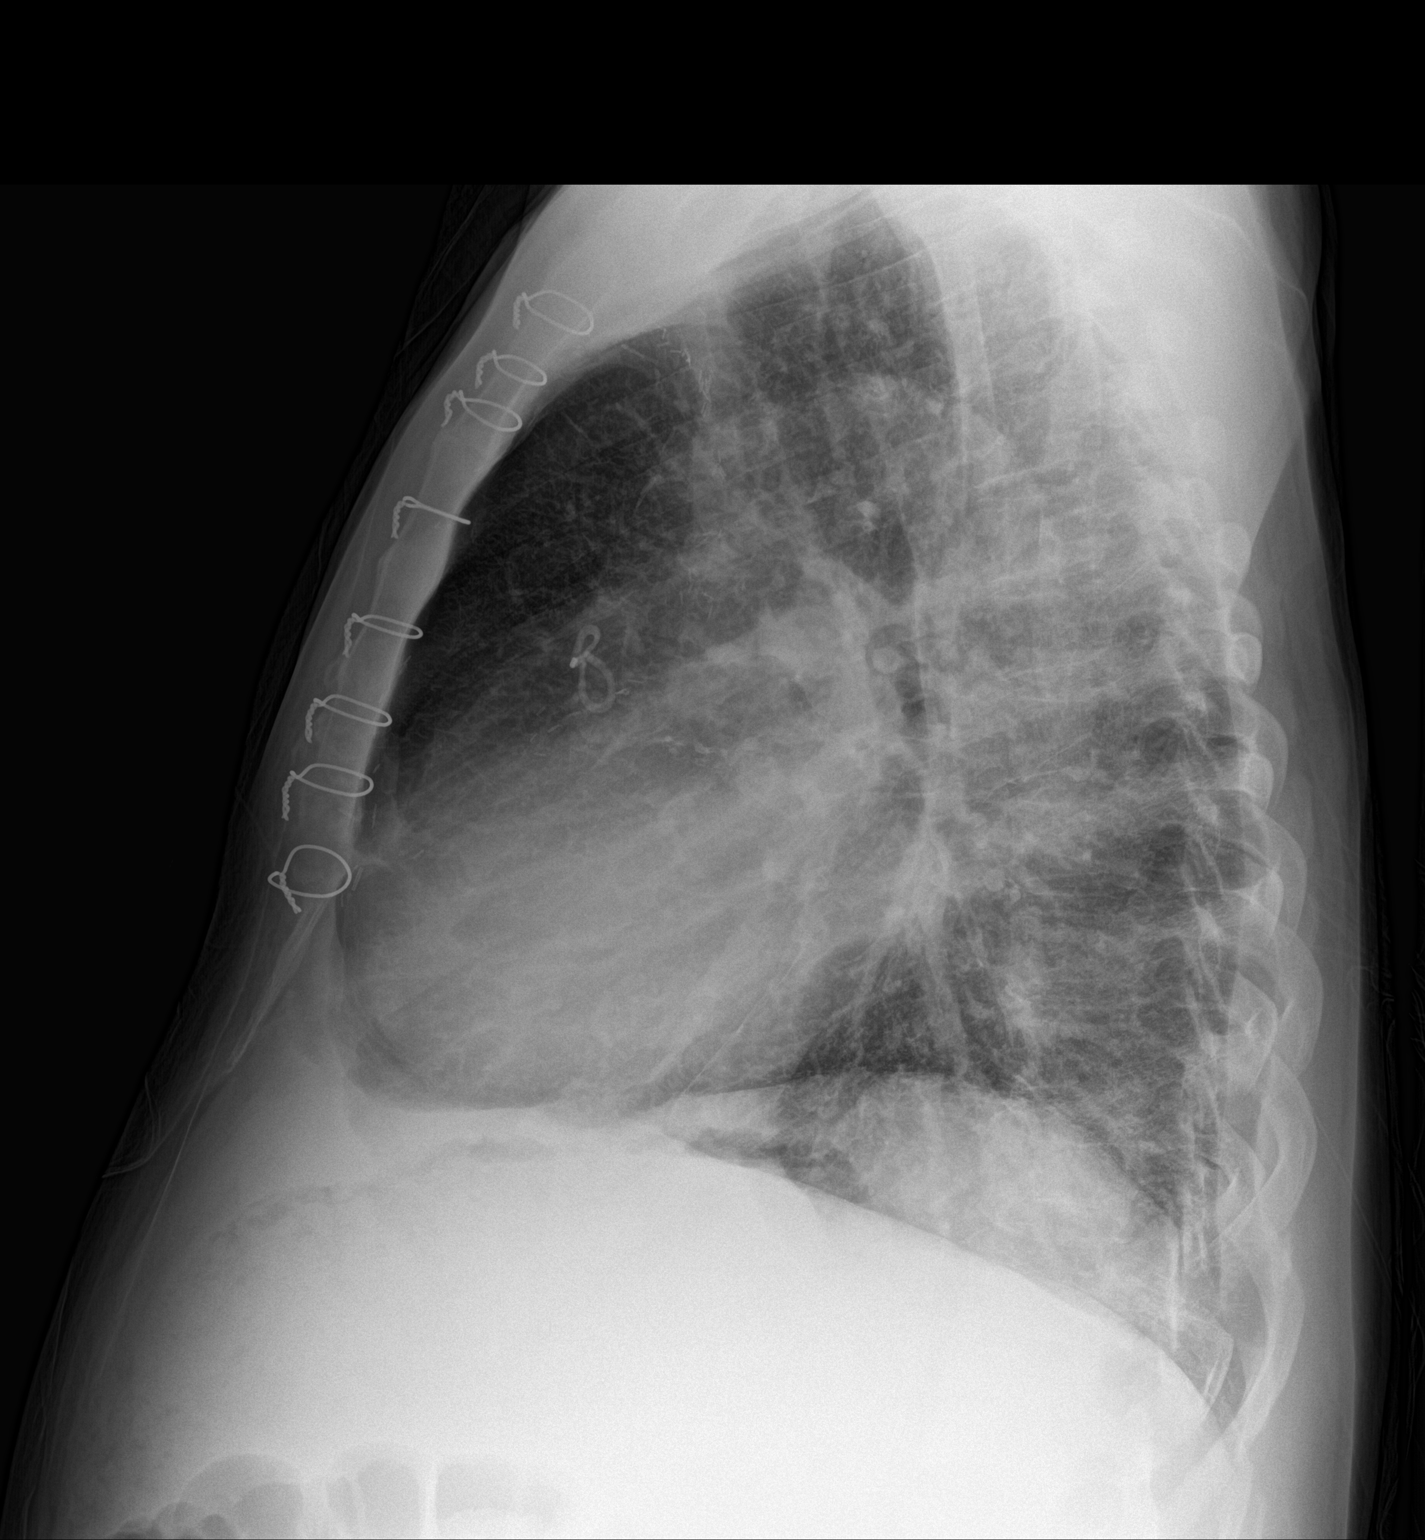

[2 of 2 positions shown; findings below may reference images not displayed]

FINDINGS: The heart is enlarged but stable. Stable surgical changes from
bypass surgery. Stable tortuosity and calcification of the thoracic
aorta. Vascular congestion and mild interstitial edema but no
pleural effusions. The bony thorax is intact.
IMPRESSION: Mild CHF.

## 2018-09-04 IMAGING — CR DG CHEST 2V
2 series · 2 of 2 positions shown · non-contrast
Comparison: 01/28/2016

CLINICAL DATA: Shortness of breath, fluid overload, end-stage renal
disease on dialysis, cirrhosis

EXAM:
CHEST  2 VIEW

[chest pa]
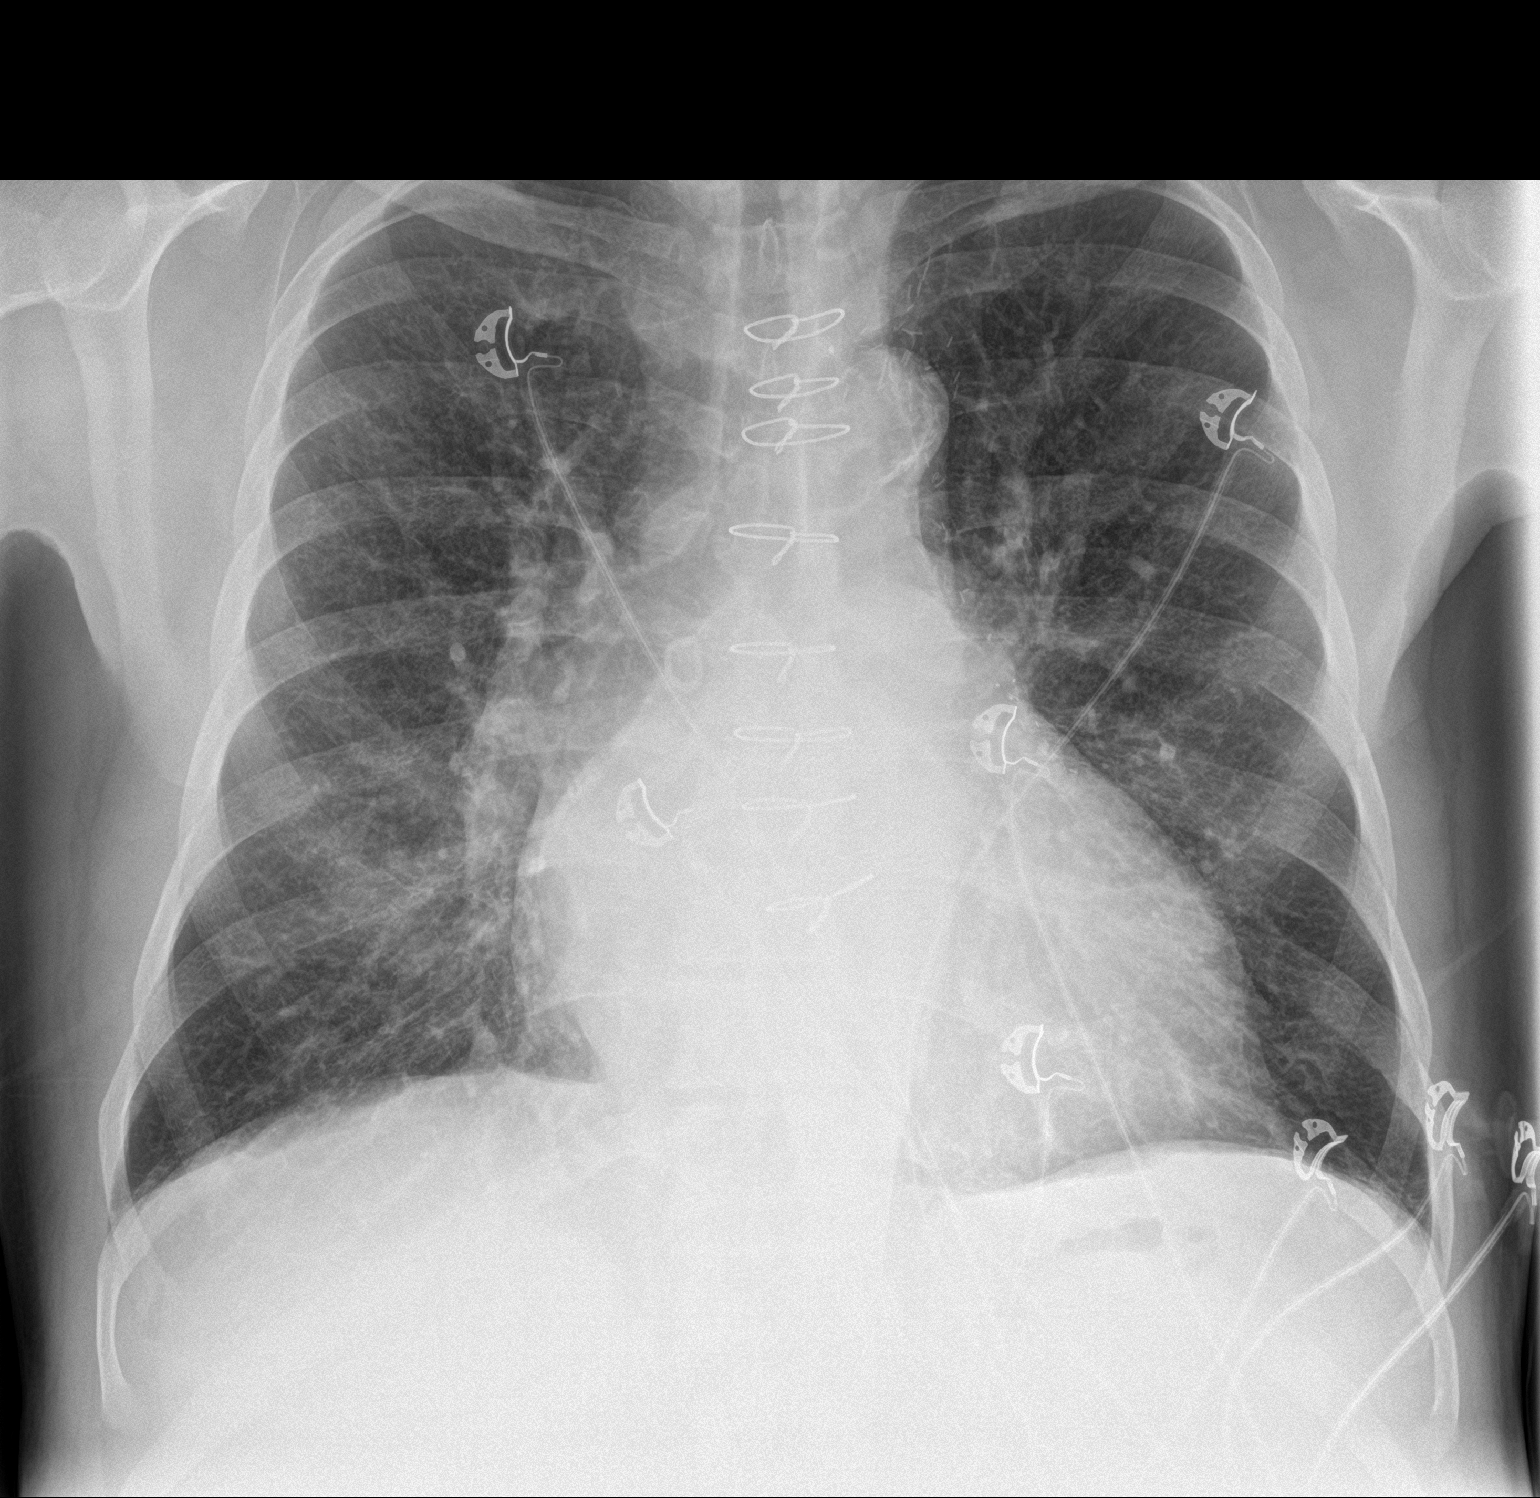

[chest lat]
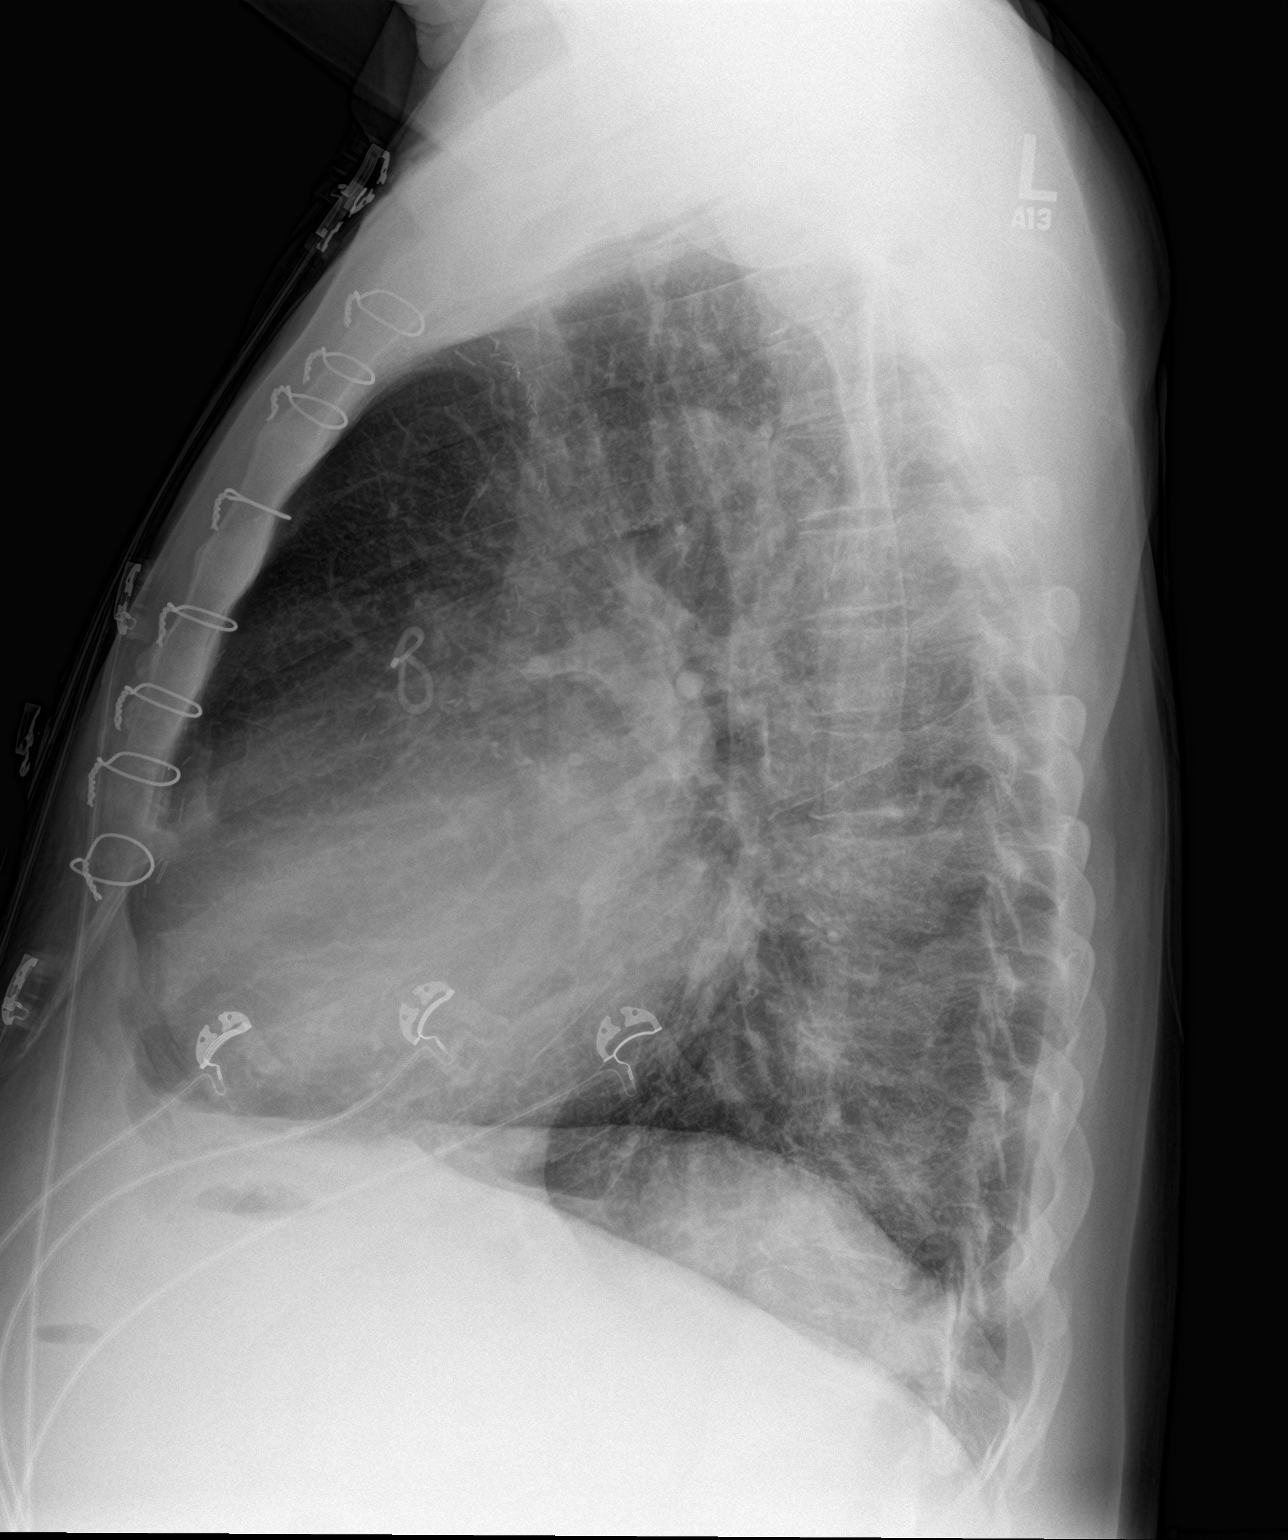

[2 of 2 positions shown; findings below may reference images not displayed]

FINDINGS: Enlargement of cardiac silhouette post CABG.

Pulmonary vascular congestion.

Atherosclerotic calcification aorta.

Scattered interstitial prominence likely reflecting mild pulmonary
edema and CHF/fluid overload.

No gross pleural effusion or pneumothorax.

Underlying hyperinflation of the lungs which could reflect COPD.

No acute osseous findings.
IMPRESSION: Probable mild CHF.

## 2018-09-06 IMAGING — US US PARACENTESIS
1 series · 11 of 11 positions shown · non-contrast
Comparison: none

INDICATION: Ascites

[Series 1: us paracentesis · 0.26mm/px · 11 of 11 slices shown]
[im 1/11]
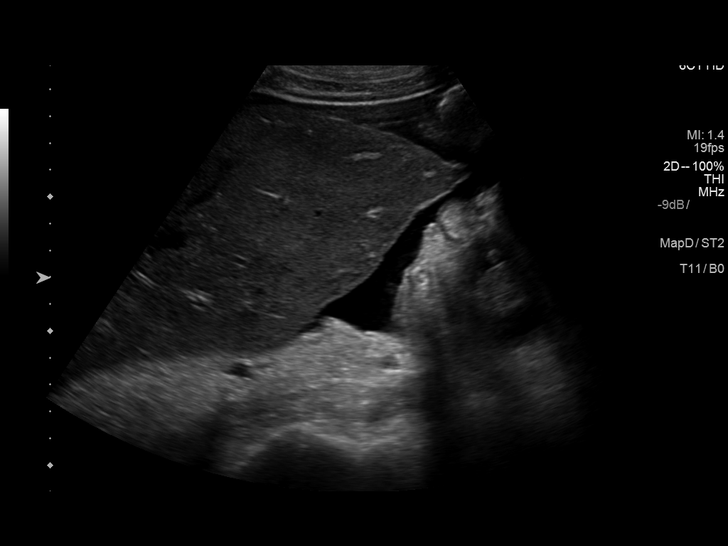
[im 2/11]
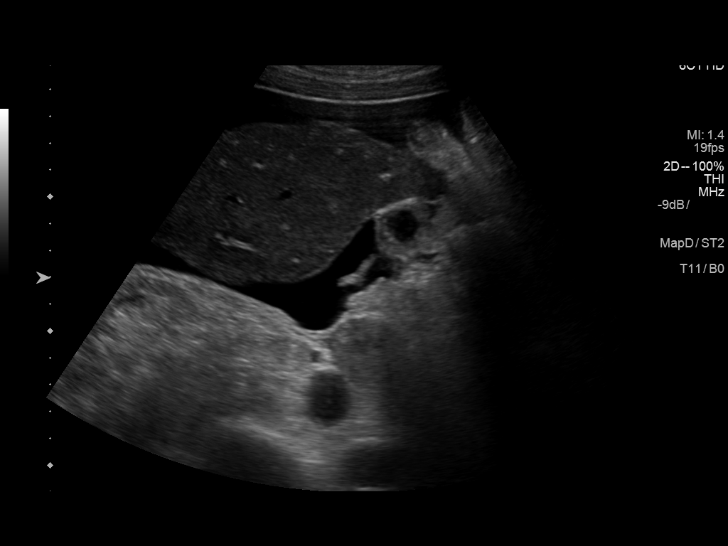
[im 3/11]
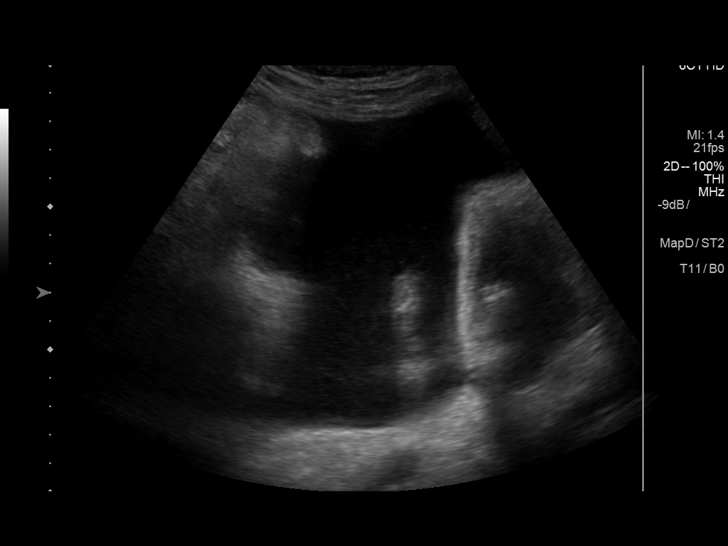
[im 4/11]
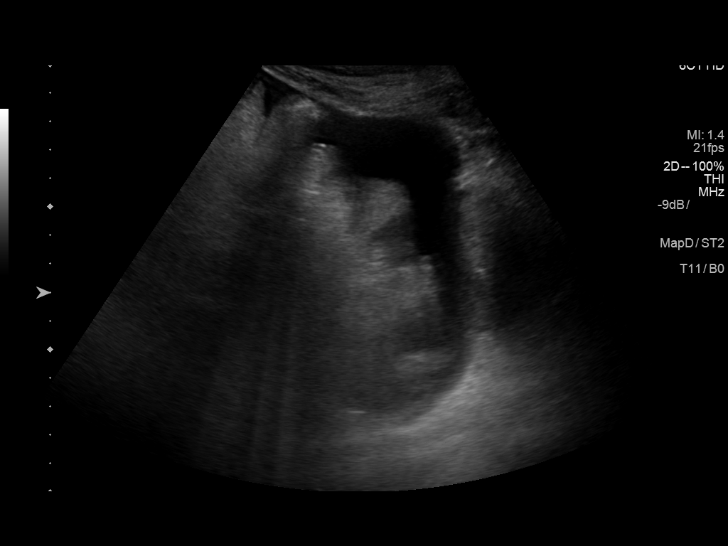
[im 5/11]
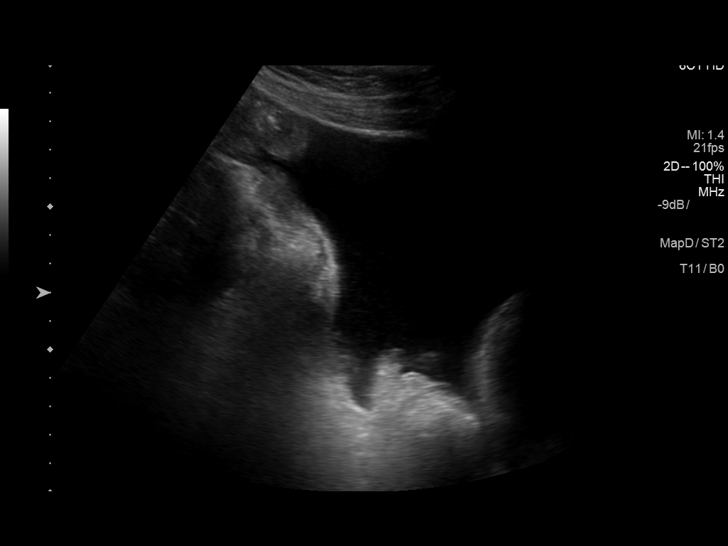
[im 6/11]
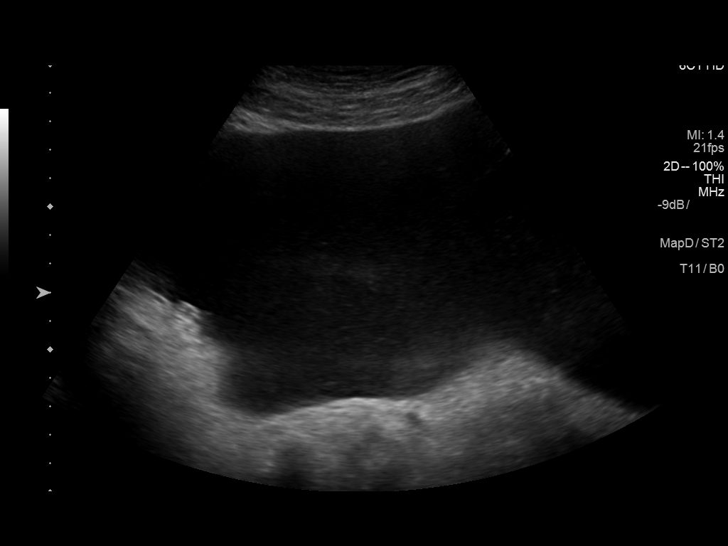
[im 7/11]
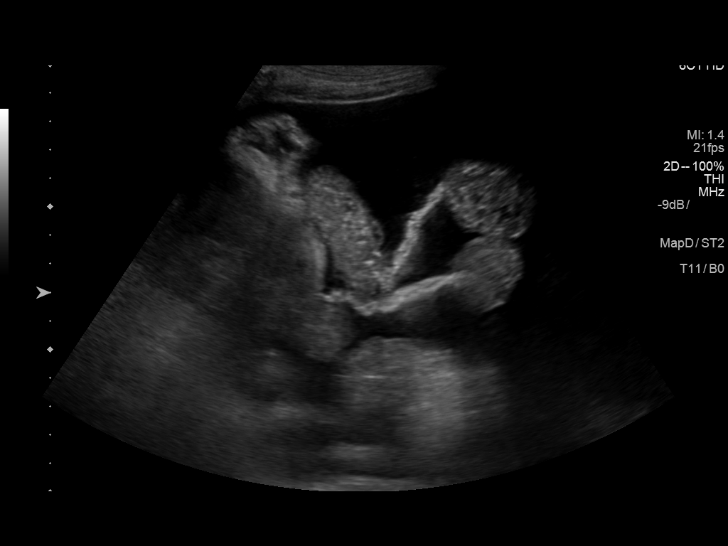
[im 8/11]
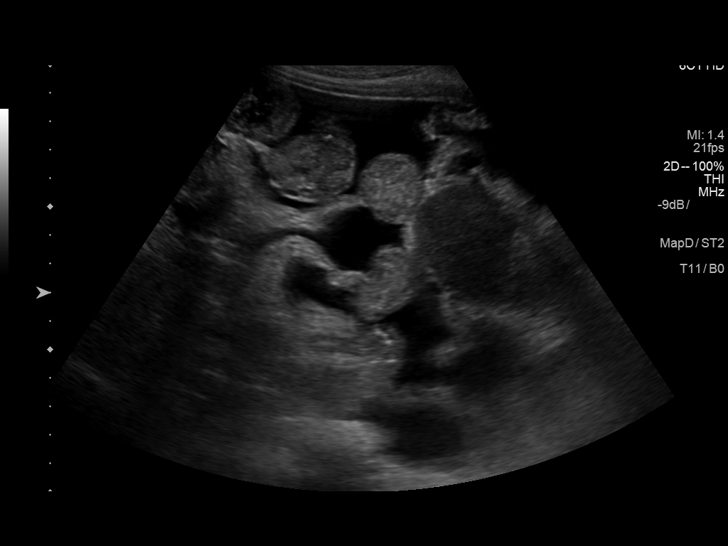
[im 9/11]
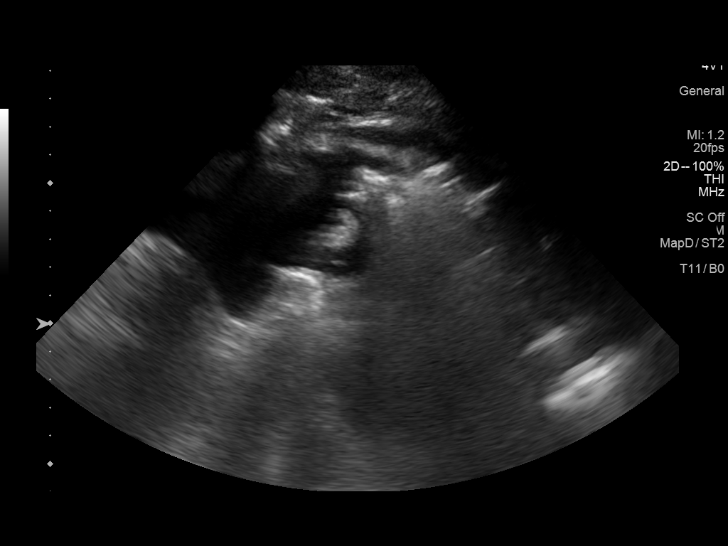
[im 10/11]
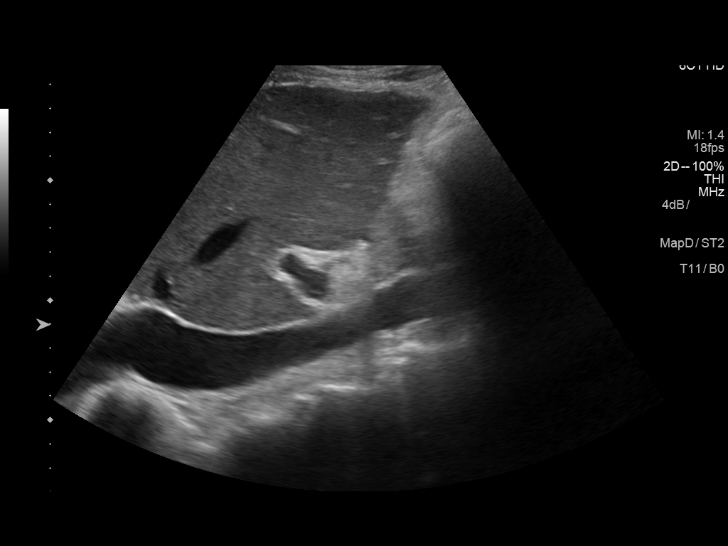
[im 11/11]
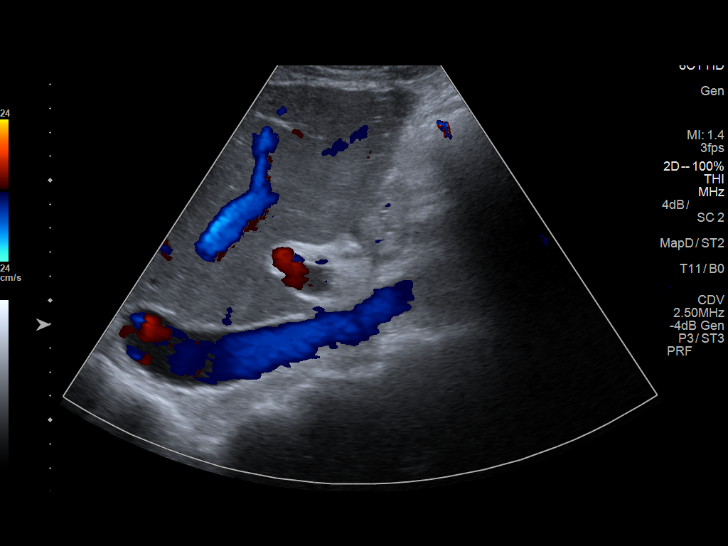

[11 of 11 positions shown; findings below may reference images not displayed]

EXAM:
ULTRASOUND GUIDED  PARACENTESIS

MEDICATIONS:
None.

COMPLICATIONS:
None immediate.

PROCEDURE:
Informed written consent was obtained from the patient after a
discussion of the risks, benefits and alternatives to treatment. A
timeout was performed prior to the initiation of the procedure.

Initial ultrasound scanning demonstrates a large amount of ascites
within the right mid abdomen . The right abdomen was prepped and
draped in the usual sterile fashion. 1% lidocaine was used for local
anesthesia.

Following this, a 6 Fr Safe-T-Centesis catheter was introduced. An
ultrasound image was saved for documentation purposes. The
paracentesis was performed. The catheter was removed and a dressing
was applied. The patient tolerated the procedure well without
immediate post procedural complication.
FINDINGS: A total of approximately 3.8 L of clear yellow fluid was removed.
Samples were sent to the laboratory as requested by the clinical
team.
IMPRESSION: Successful ultrasound-guided paracentesis yielding 3.8 liters of
peritoneal fluid.

## 2018-10-11 IMAGING — US US PARACENTESIS
1 series · 6 of 6 positions shown · non-contrast
Comparison: none

INDICATION: Cirrhosis, ascites and end-stage renal disease.

[Series 1: us paracentesis · 0.26mm/px · 6 of 6 slices shown]
[im 1/6]
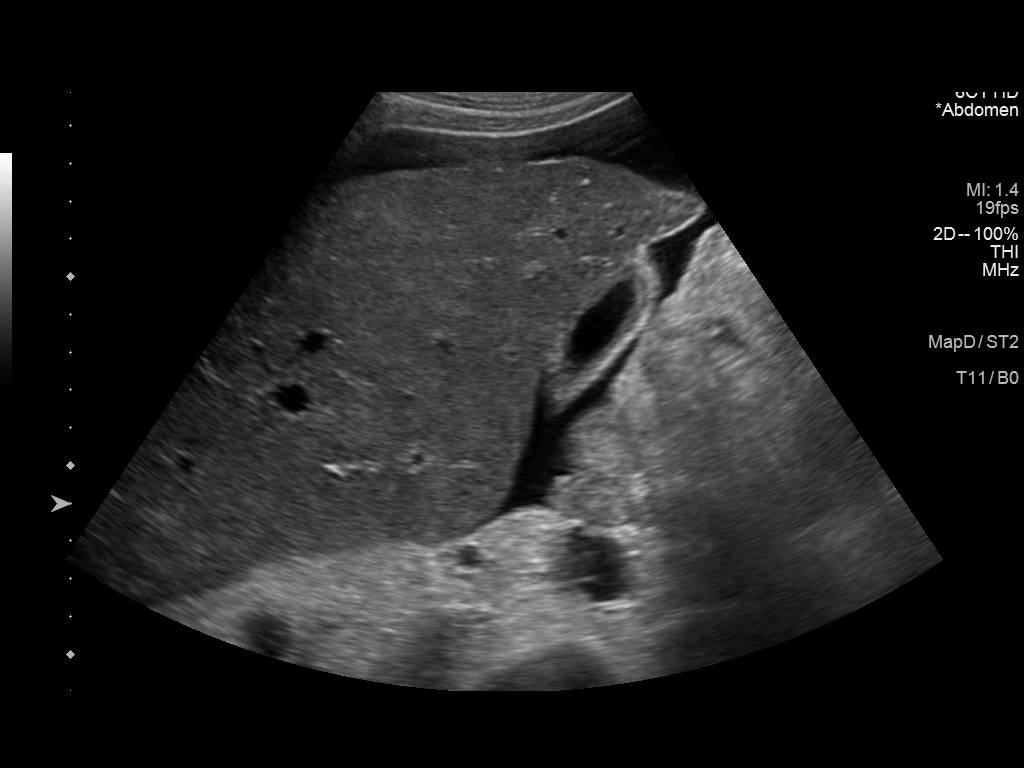
[im 2/6]
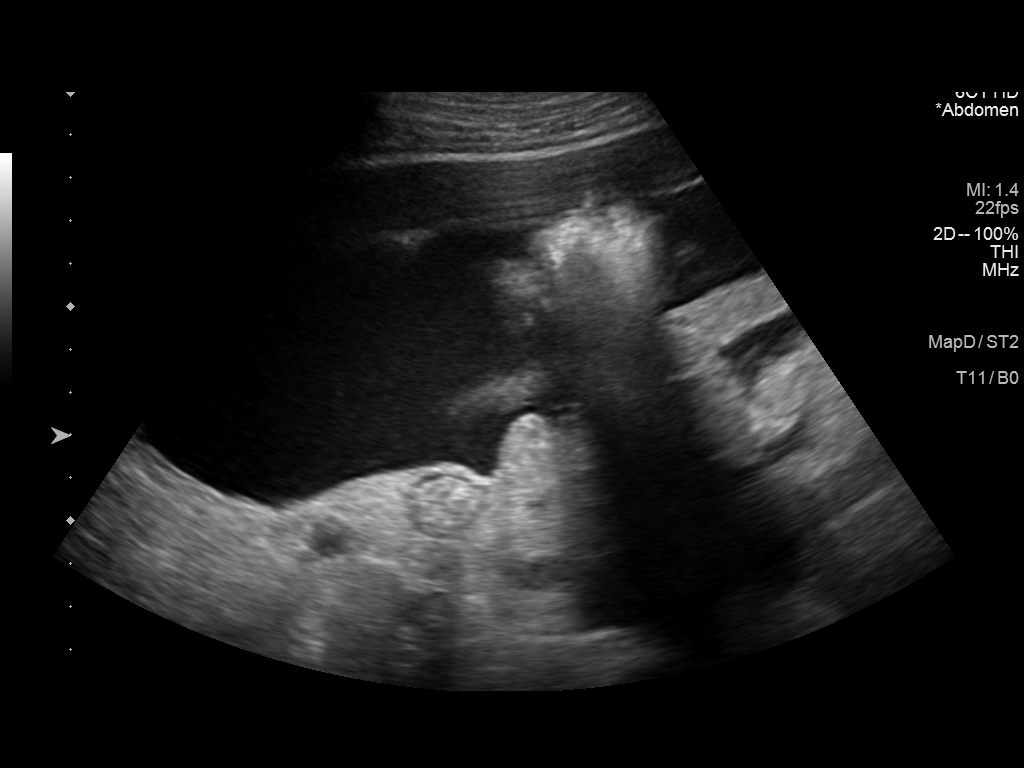
[im 3/6]
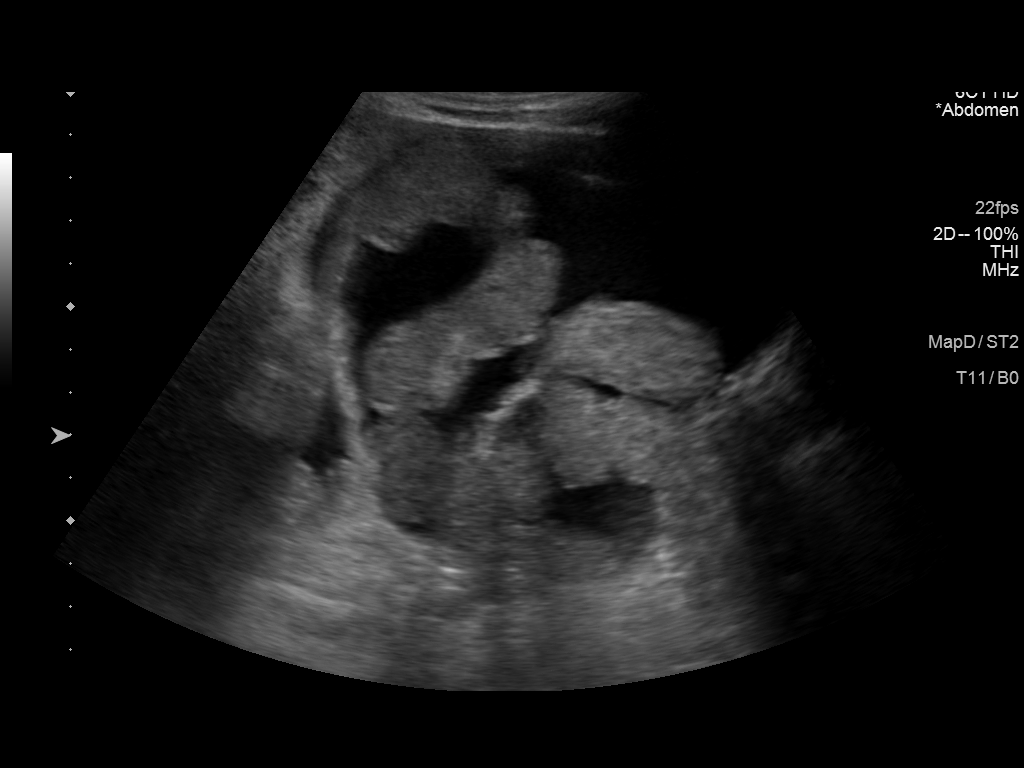
[im 4/6]
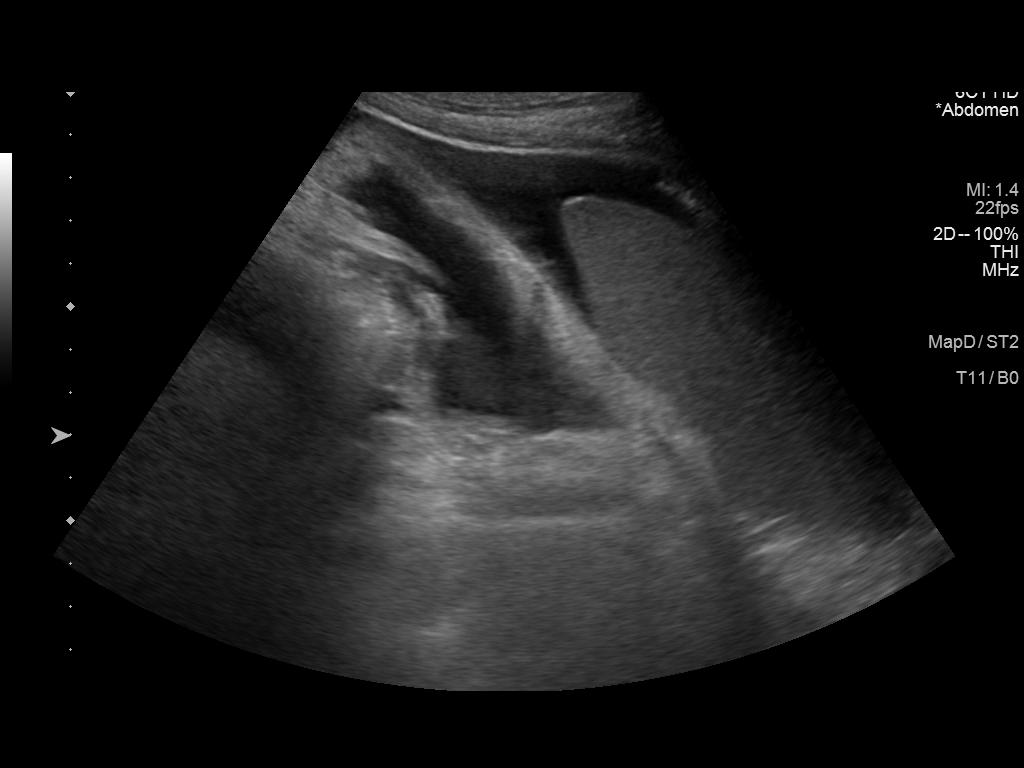
[im 5/6]
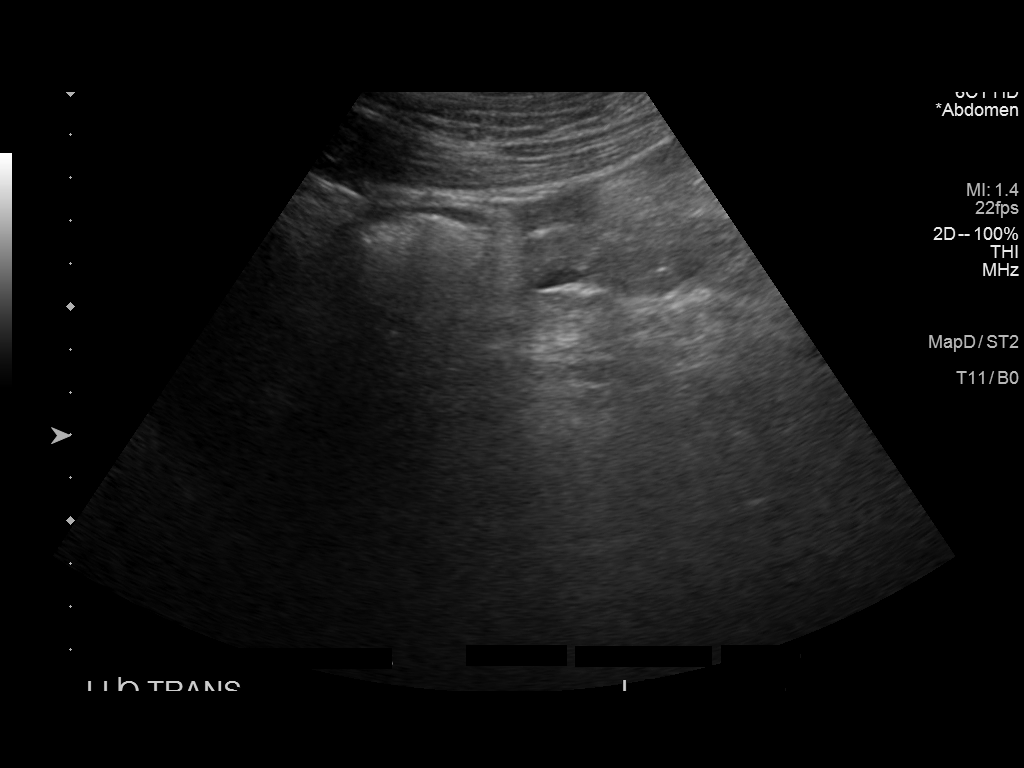
[im 6/6]
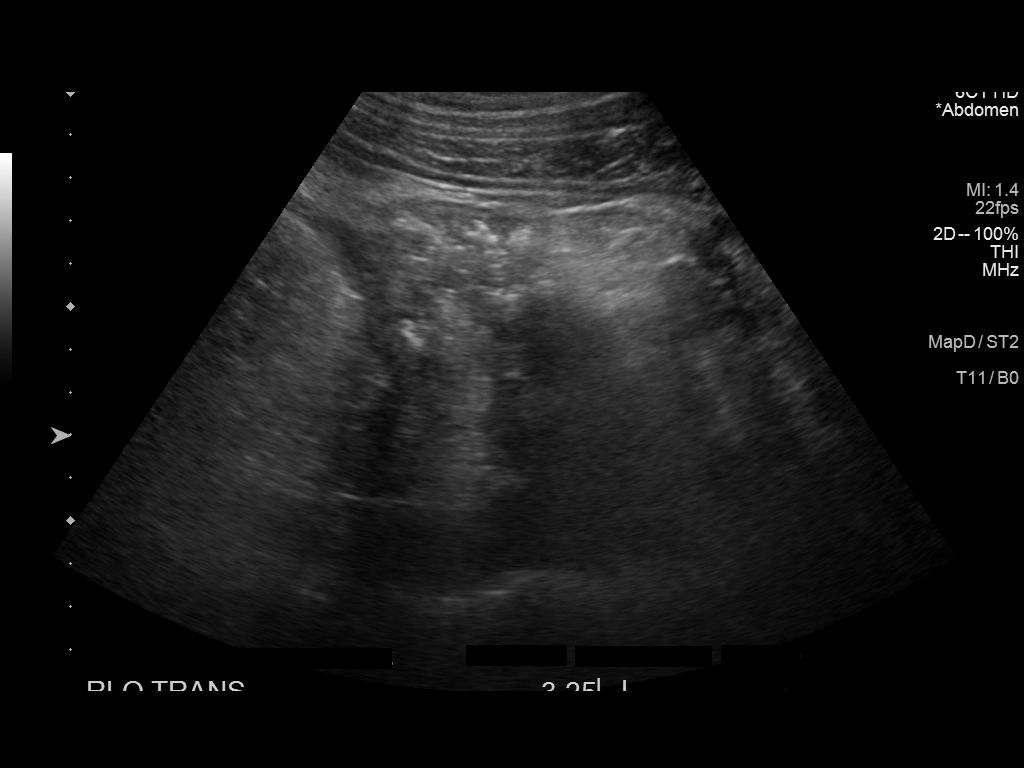

[6 of 6 positions shown; findings below may reference images not displayed]

EXAM:
ULTRASOUND GUIDED PARACENTESIS

MEDICATIONS:
None.

COMPLICATIONS:
None immediate.

PROCEDURE:
Informed written consent was obtained from the patient after a
discussion of the risks, benefits and alternatives to treatment. A
timeout was performed prior to the initiation of the procedure.

Initial ultrasound was performed to localize ascites. The right
lower abdomen was prepped and draped in the usual sterile fashion.
1% lidocaine was used for local anesthesia.

Following this, a 6 Fr Safe-T-Centesis catheter was introduced. An
ultrasound image was saved for documentation purposes. Paracentesis
was performed. The catheter was removed and a dressing was applied.
The patient tolerated the procedure well without immediate post
procedural complication.
FINDINGS: A total of approximately 3.3 L of amber colored fluid was removed.
IMPRESSION: Successful ultrasound-guided paracentesis yielding 3.3 liters of
peritoneal fluid.

## 2018-12-22 IMAGING — US US ABDOMEN LIMITED
1 series · 6 of 6 positions shown · non-contrast
Comparison: 03/09/2016.

CLINICAL DATA: 55-year-old male with cirrhosis and end-stage renal
disease. Abdominal pain.

EXAM:
LIMITED ABDOMEN ULTRASOUND FOR ASCITES
TECHNIQUE: Limited ultrasound survey for ascites was performed in all four
abdominal quadrants.

[Series 1: us abdomen limited · 0.26mm/px · 6 of 6 slices shown]
[im 1/6]
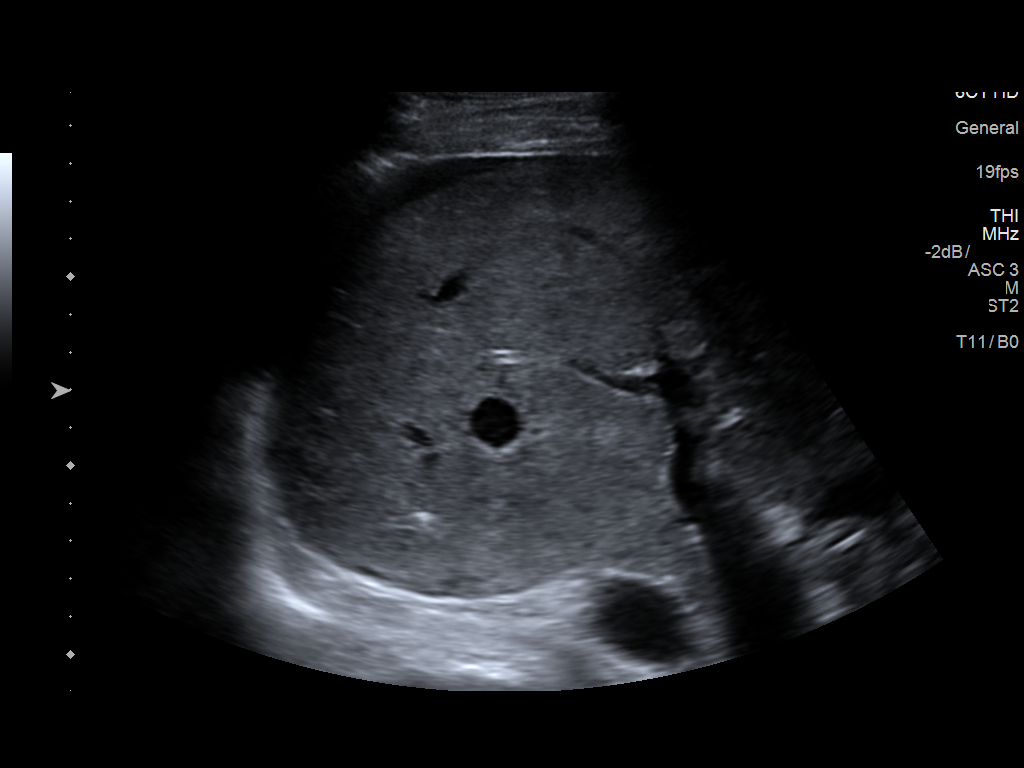
[im 2/6]
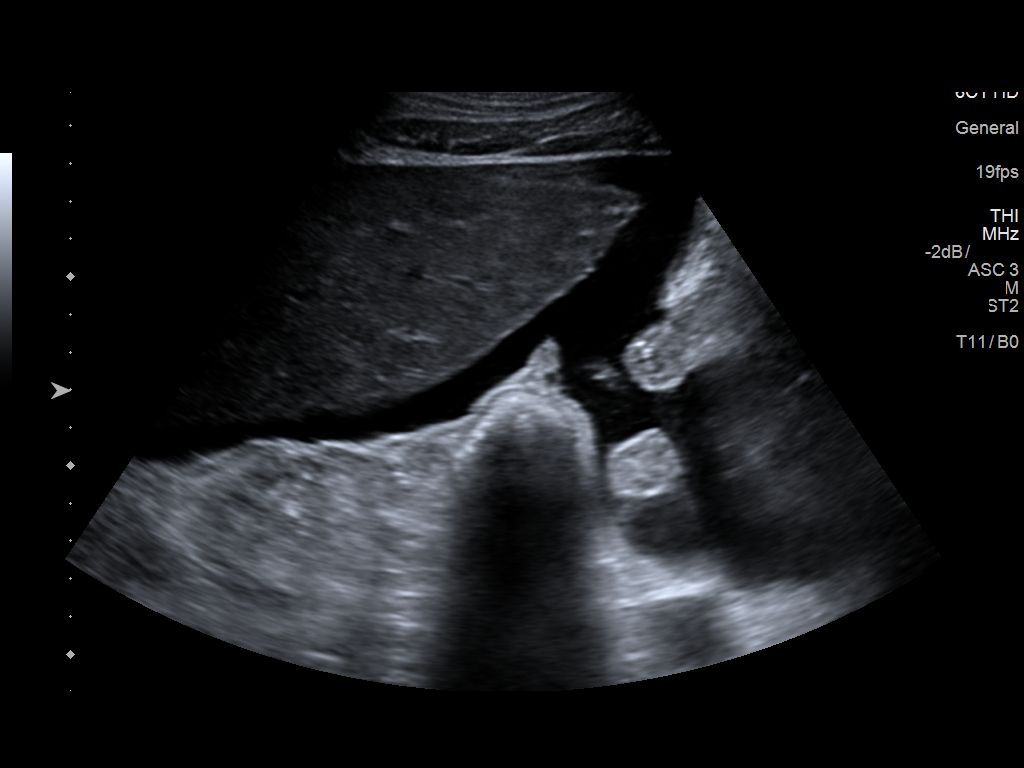
[im 3/6]
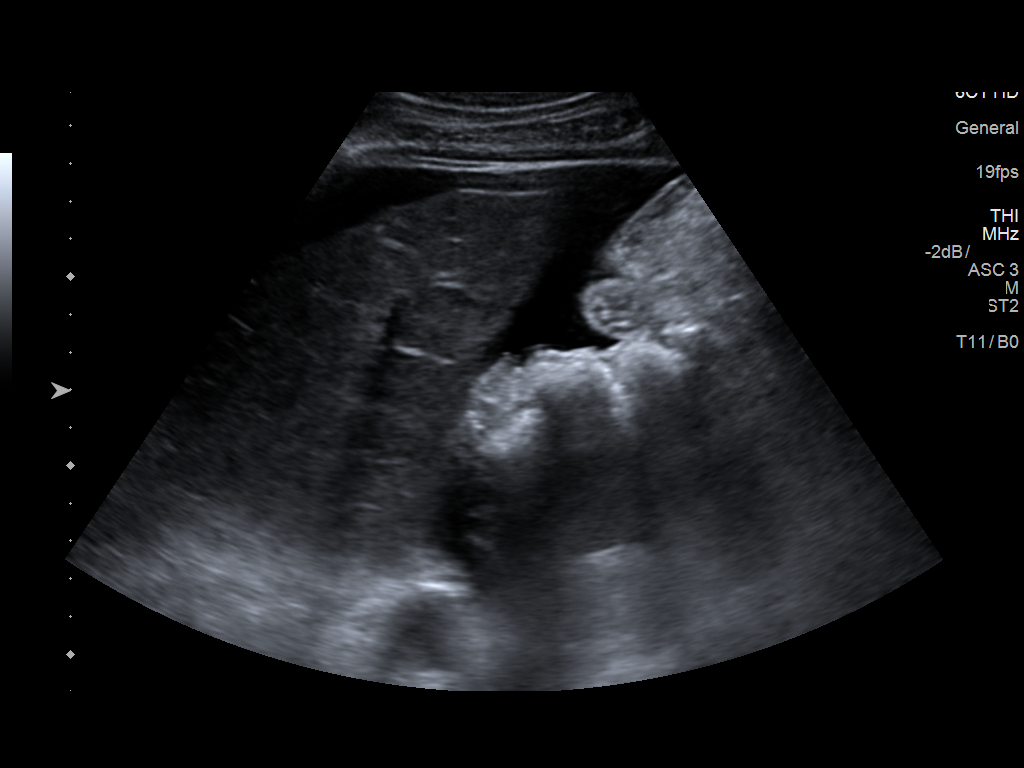
[im 4/6]
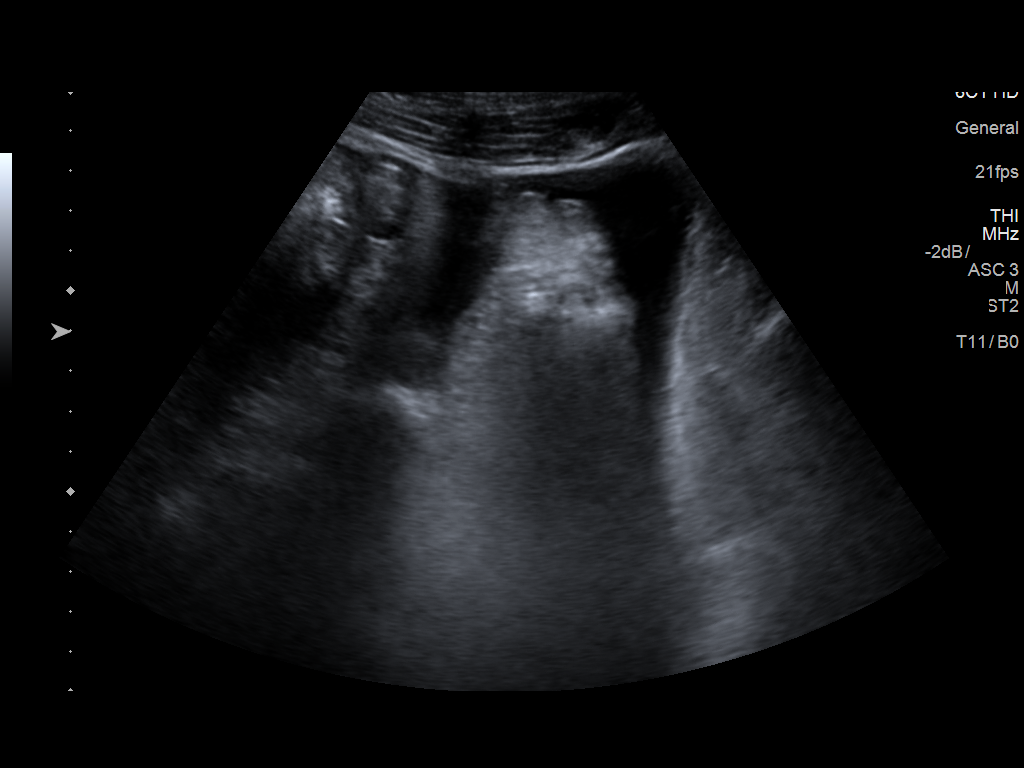
[im 5/6]
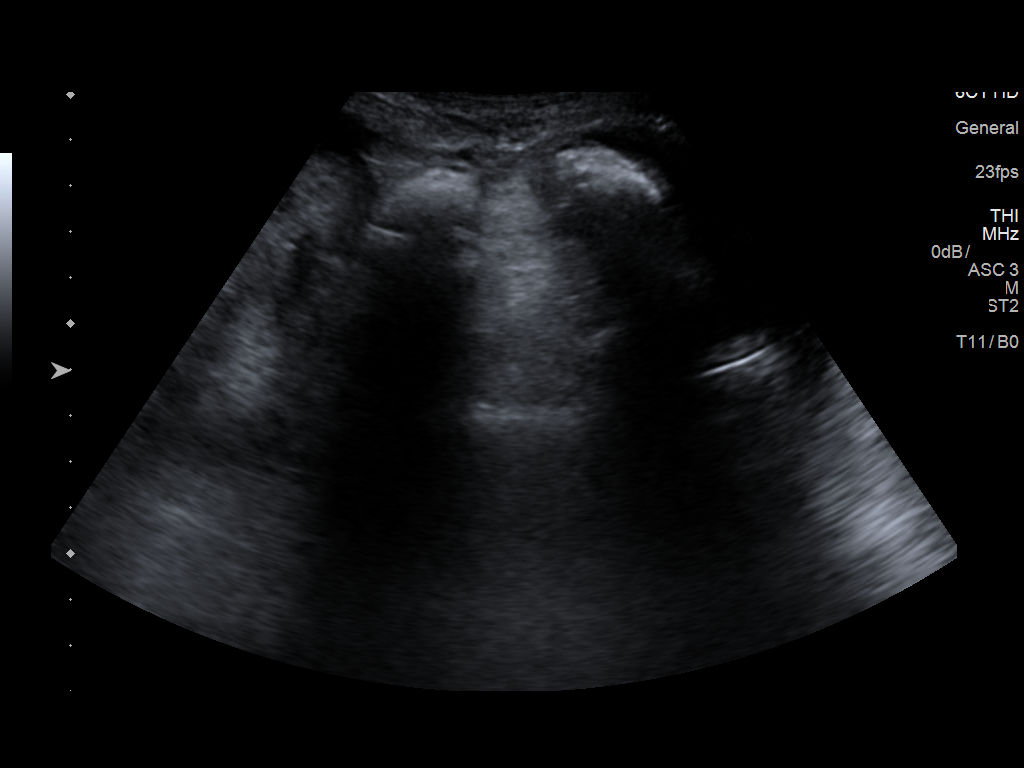
[im 6/6]
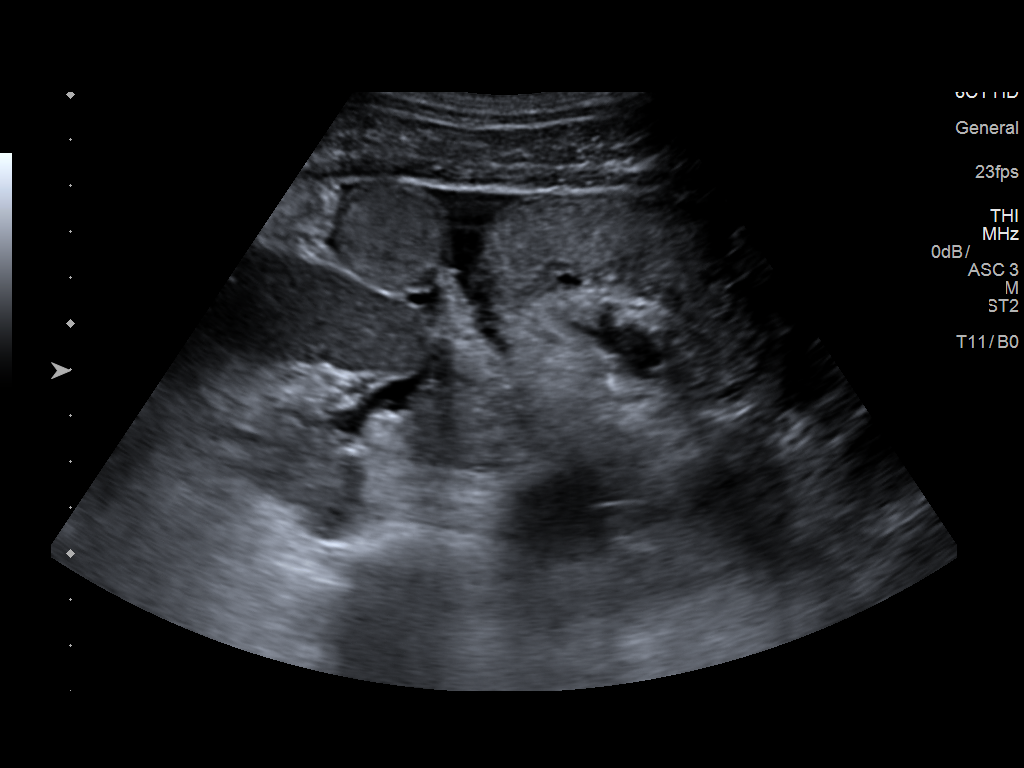

[6 of 6 positions shown; findings below may reference images not displayed]

FINDINGS: Hepatomegaly and small volume of ascites fluid is identified in the
4 quadrants of the abdomen. The quantity is less than that on
03/09/2016 and insufficient for paracentesis.
IMPRESSION: Small volume of ascites, quantity insufficient for paracentesis.

## 2019-01-16 IMAGING — US US PARACENTESIS
1 series · 6 of 6 positions shown · non-contrast
Comparison: none

INDICATION: Cirrhosis and ascites.

[Series 1: us paracentesis · 0.24mm/px · 6 of 6 slices shown]
[im 1/6]
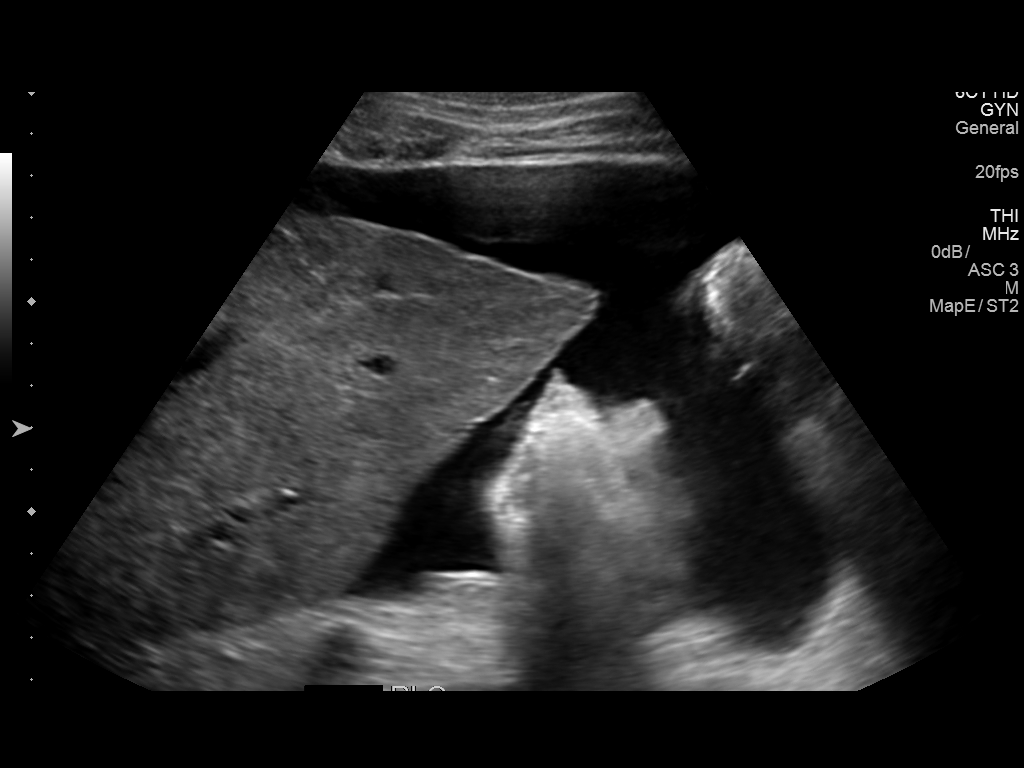
[im 2/6]
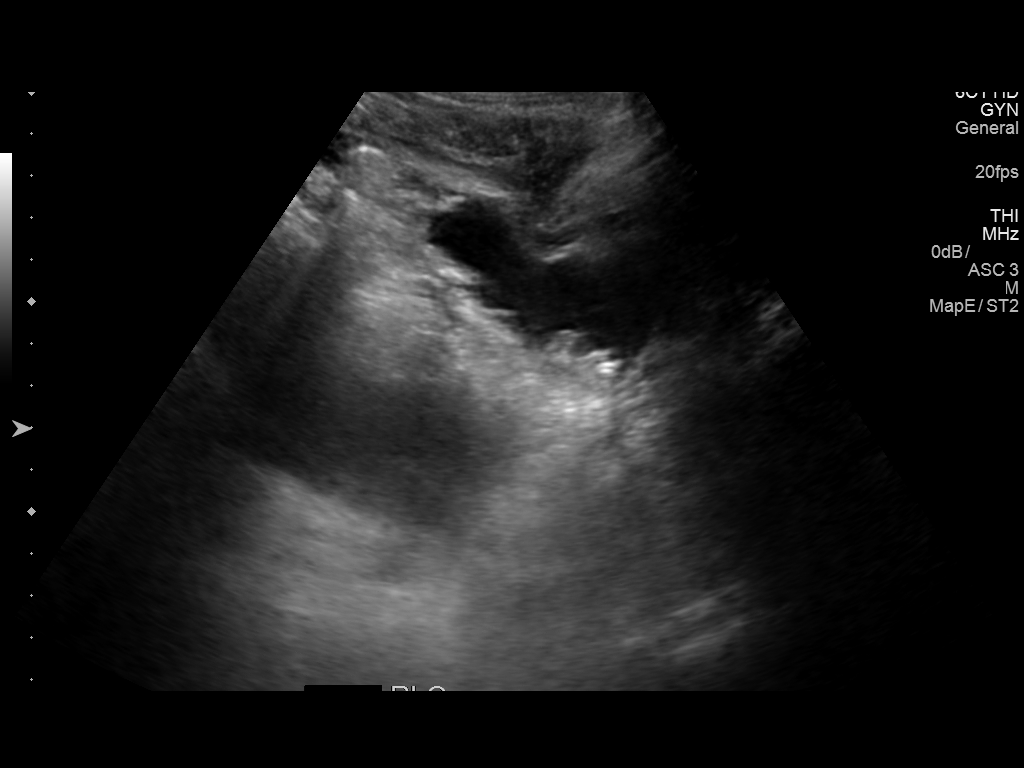
[im 3/6]
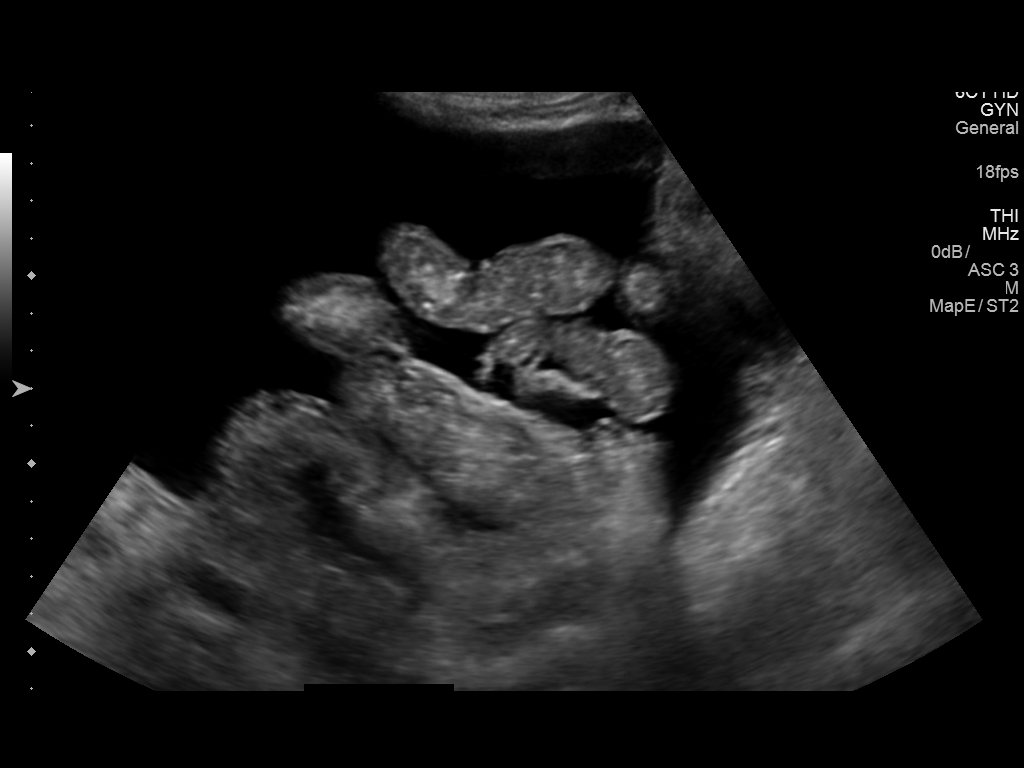
[im 4/6]
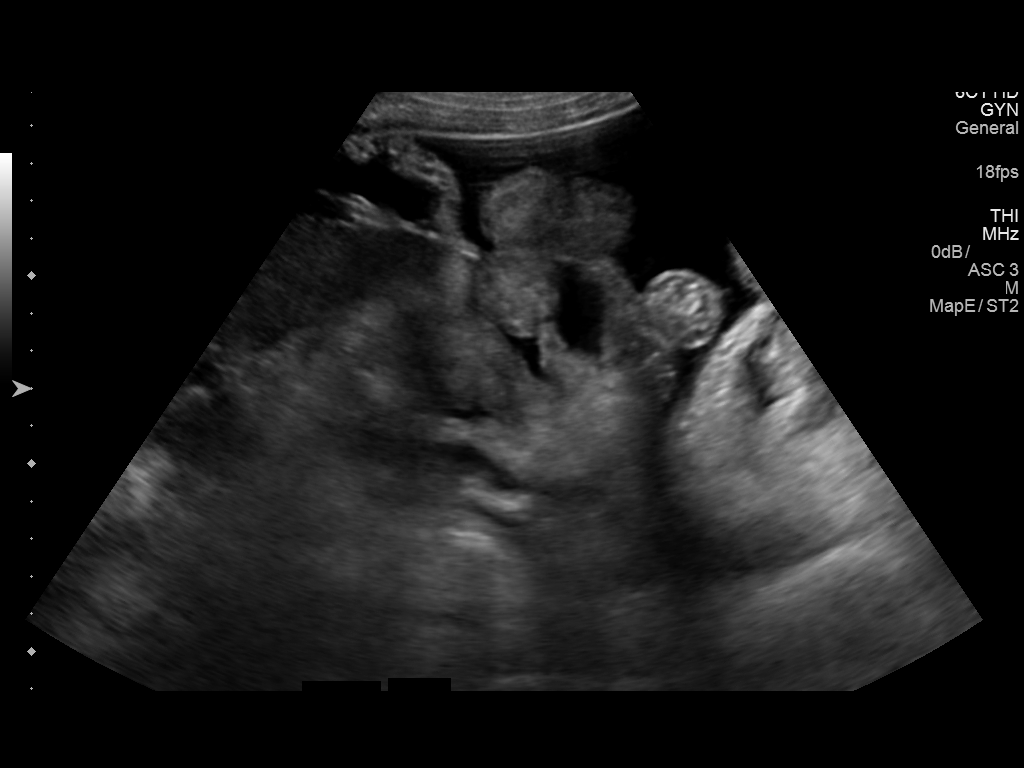
[im 5/6]
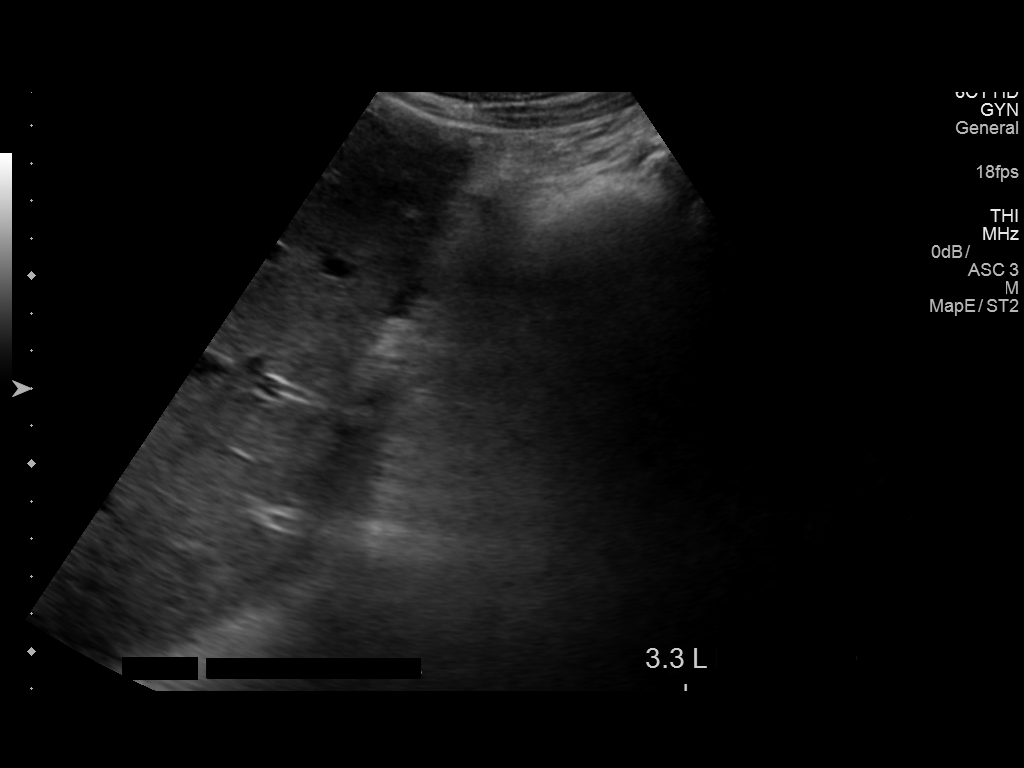
[im 6/6]
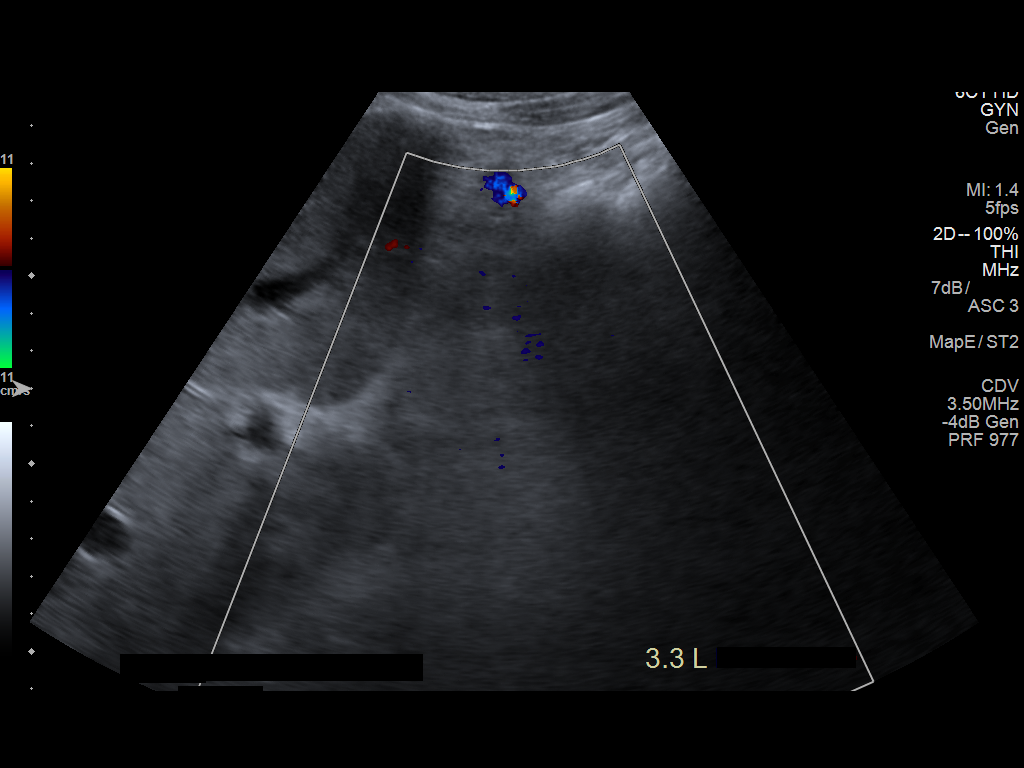

[6 of 6 positions shown; findings below may reference images not displayed]

EXAM:
ULTRASOUND GUIDED PARACENTESIS

MEDICATIONS:
None.

COMPLICATIONS:
None immediate.

PROCEDURE:
Informed written consent was obtained from the patient after a
discussion of the risks, benefits and alternatives to treatment. A
timeout was performed prior to the initiation of the procedure.

Initial ultrasound was performed to localize ascites. The right
lower abdomen was prepped and draped in the usual sterile fashion.
1% lidocaine was used for local anesthesia.

Following this, a 6 Fr Safe-T-Centesis catheter was introduced. An
ultrasound image was saved for documentation purposes. The
paracentesis was performed. The catheter was removed and a dressing
was applied. The patient tolerated the procedure well without
immediate post procedural complication.
FINDINGS: A total of approximately 3.3 L of yellowish fluid was removed.
Samples were sent to the laboratory as requested by the clinical
team.
IMPRESSION: Successful ultrasound-guided paracentesis yielding 3.3 liters of
peritoneal fluid.

## 2019-01-26 IMAGING — NM NM GI BLOOD LOSS
2 series · 12 of 12 positions shown · non-contrast
Comparison: Ultrasound dated 05/31/2016

CLINICAL DATA: Gastrointestinal hemorrhage.

EXAM:
NUCLEAR MEDICINE GASTROINTESTINAL BLEEDING SCAN
TECHNIQUE: Sequential abdominal images were obtained following intravenous
administration of Mc-CCm labeled red blood cells.
RADIOPHARMACEUTICALS:  21.1 mCi Mc-CCm in-vitro labeled red cells.

[Series 1000: gi bleed hour 2 · 4.80mm/px · 6 of 60 frames shown]
[frame 6/60]
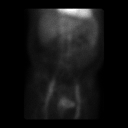
[frame 16/60]
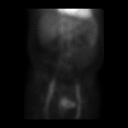
[frame 26/60]
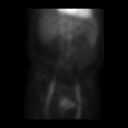
[frame 36/60]
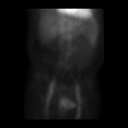
[frame 46/60]
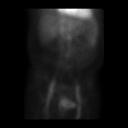
[frame 56/60]
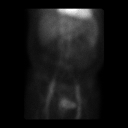

[Series 1000: hour 1 gi bleed · 4.80mm/px · 6 of 60 frames shown]
[frame 6/60]
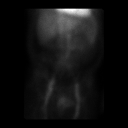
[frame 16/60]
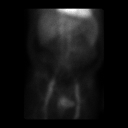
[frame 26/60]
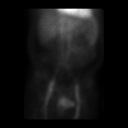
[frame 36/60]
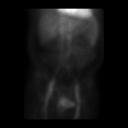
[frame 46/60]
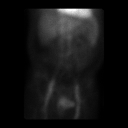
[frame 56/60]
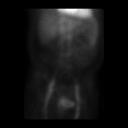

[12 of 12 positions shown; findings below may reference images not displayed]

FINDINGS: Initial flow images normal.

There is some vague and low-grade activity probably corresponding to
proximal small bowel in the left upper quadrant on the initials
power series. This appears to continue on the delayed series. The
appearance suggests a possible upper abdominal source, such as
stomach or small bowel, although the activity is 2 vague to make out
much more detail.
IMPRESSION: 1. Today's exam is positive although weakly so. The earliest visible
activity projected to be in bowel is in the left upper quadrant,
raising the possibility of a gastric or small bowel source based on
overall appearance.

## 2019-02-05 IMAGING — US US PARACENTESIS
1 series · 4 of 4 positions shown · non-contrast
Comparison: none

INDICATION: Patient with a history of alcoholic cirrhosis with recurrent
abdominal ascites. Request is made for therapeutic paracentesis with
a 2 L maximum.

[Series 1: us paracentesis · 0.25mm/px · 4 of 4 slices shown]
[im 1/4]
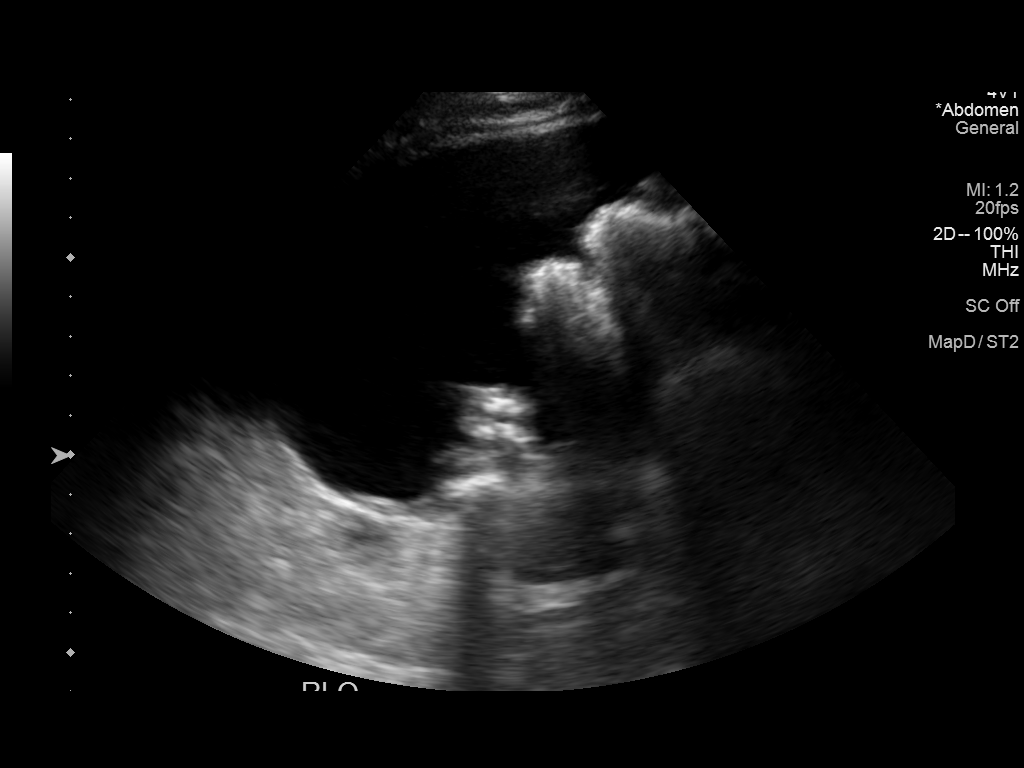
[im 2/4]
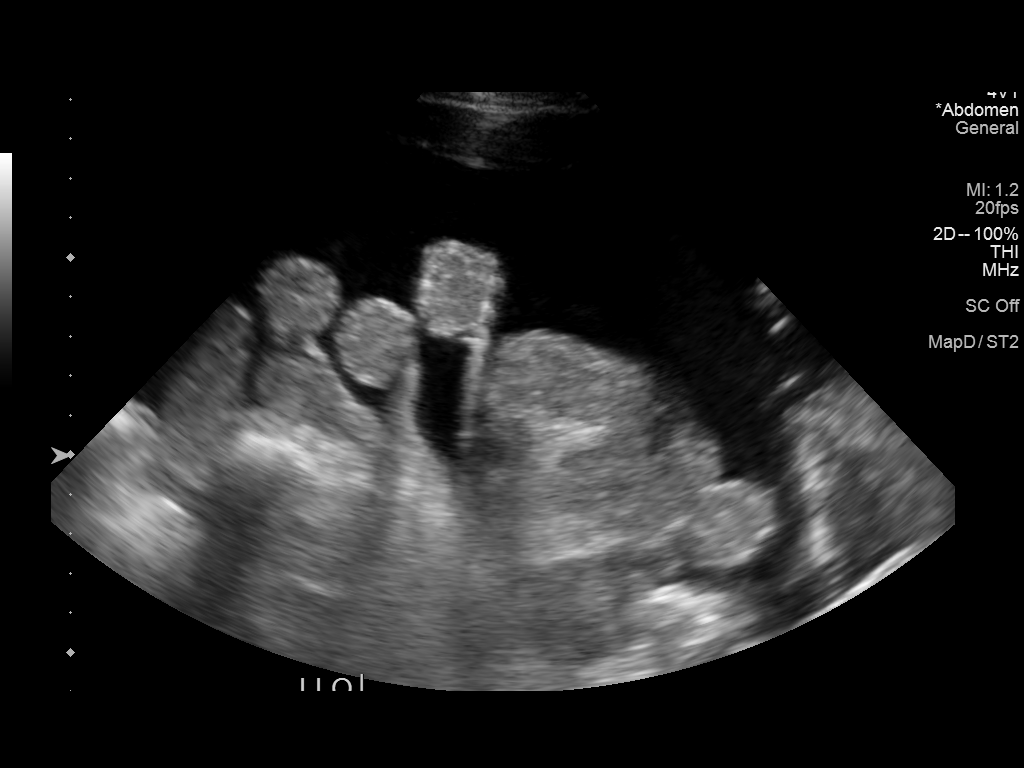
[im 3/4]
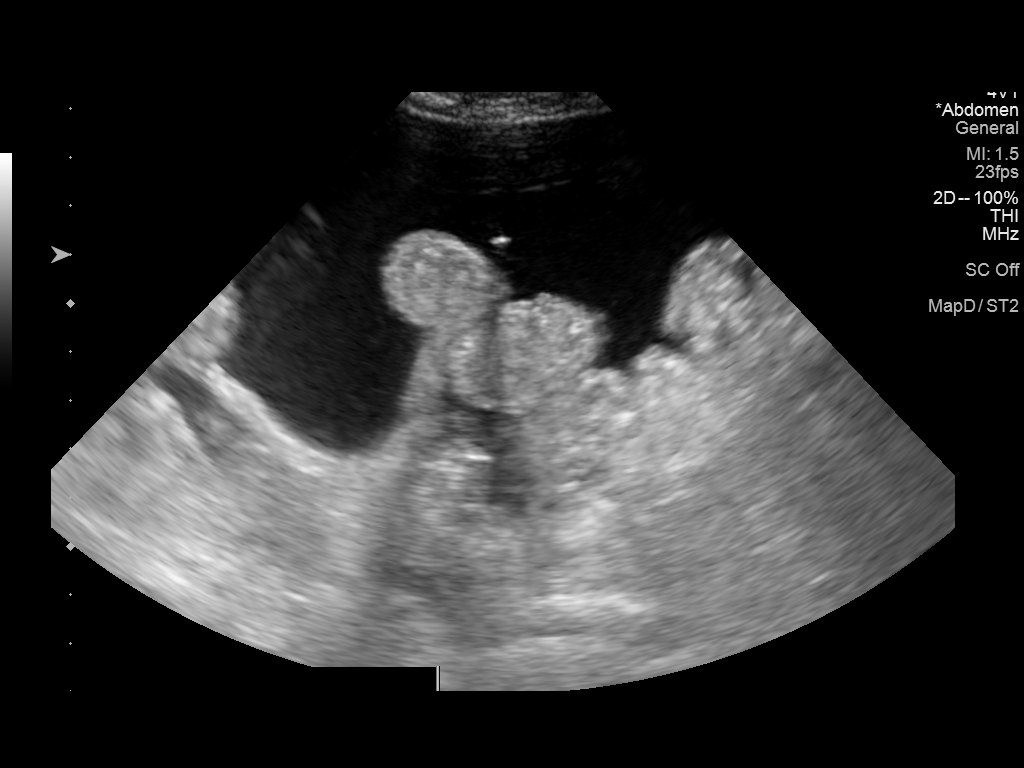
[im 4/4]
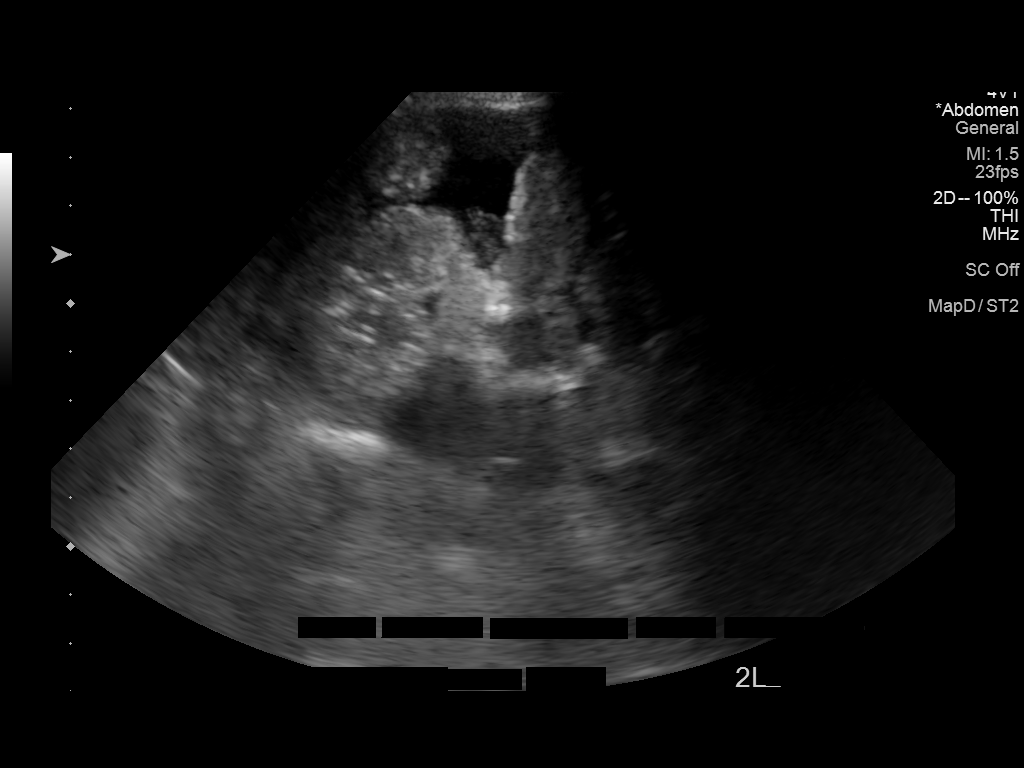

[4 of 4 positions shown; findings below may reference images not displayed]

EXAM:
ULTRASOUND GUIDED THERAPEUTIC PARACENTESIS

MEDICATIONS:
1% lidocaine

COMPLICATIONS:
None immediate.

PROCEDURE:
Informed written consent was obtained from the patient after a
discussion of the risks, benefits and alternatives to treatment. A
timeout was performed prior to the initiation of the procedure.

Initial ultrasound scanning demonstrates a moderate amount of
ascites within the left lower abdominal quadrant. The left lower
abdomen was prepped and draped in the usual sterile fashion. 1%
lidocaine was used for local anesthesia.

Following this, a Safe-T-Centesis catheter was introduced. An
ultrasound image was saved for documentation purposes. The
paracentesis was performed. The catheter was removed and a dressing
was applied. The patient tolerated the procedure well without
immediate post procedural complication.
FINDINGS: A total of approximately 2 L of amber fluid was removed.
IMPRESSION: Successful ultrasound-guided paracentesis yielding 2 liters of
peritoneal fluid.

## 2019-02-07 IMAGING — CR DG CHEST 2V
2 series · 2 of 2 positions shown · non-contrast
Comparison: Chest radiograph performed 07/03/2016

CLINICAL DATA: Acute onset of shortness of breath. Initial
encounter.

EXAM:
CHEST  2 VIEW

[chest pa]
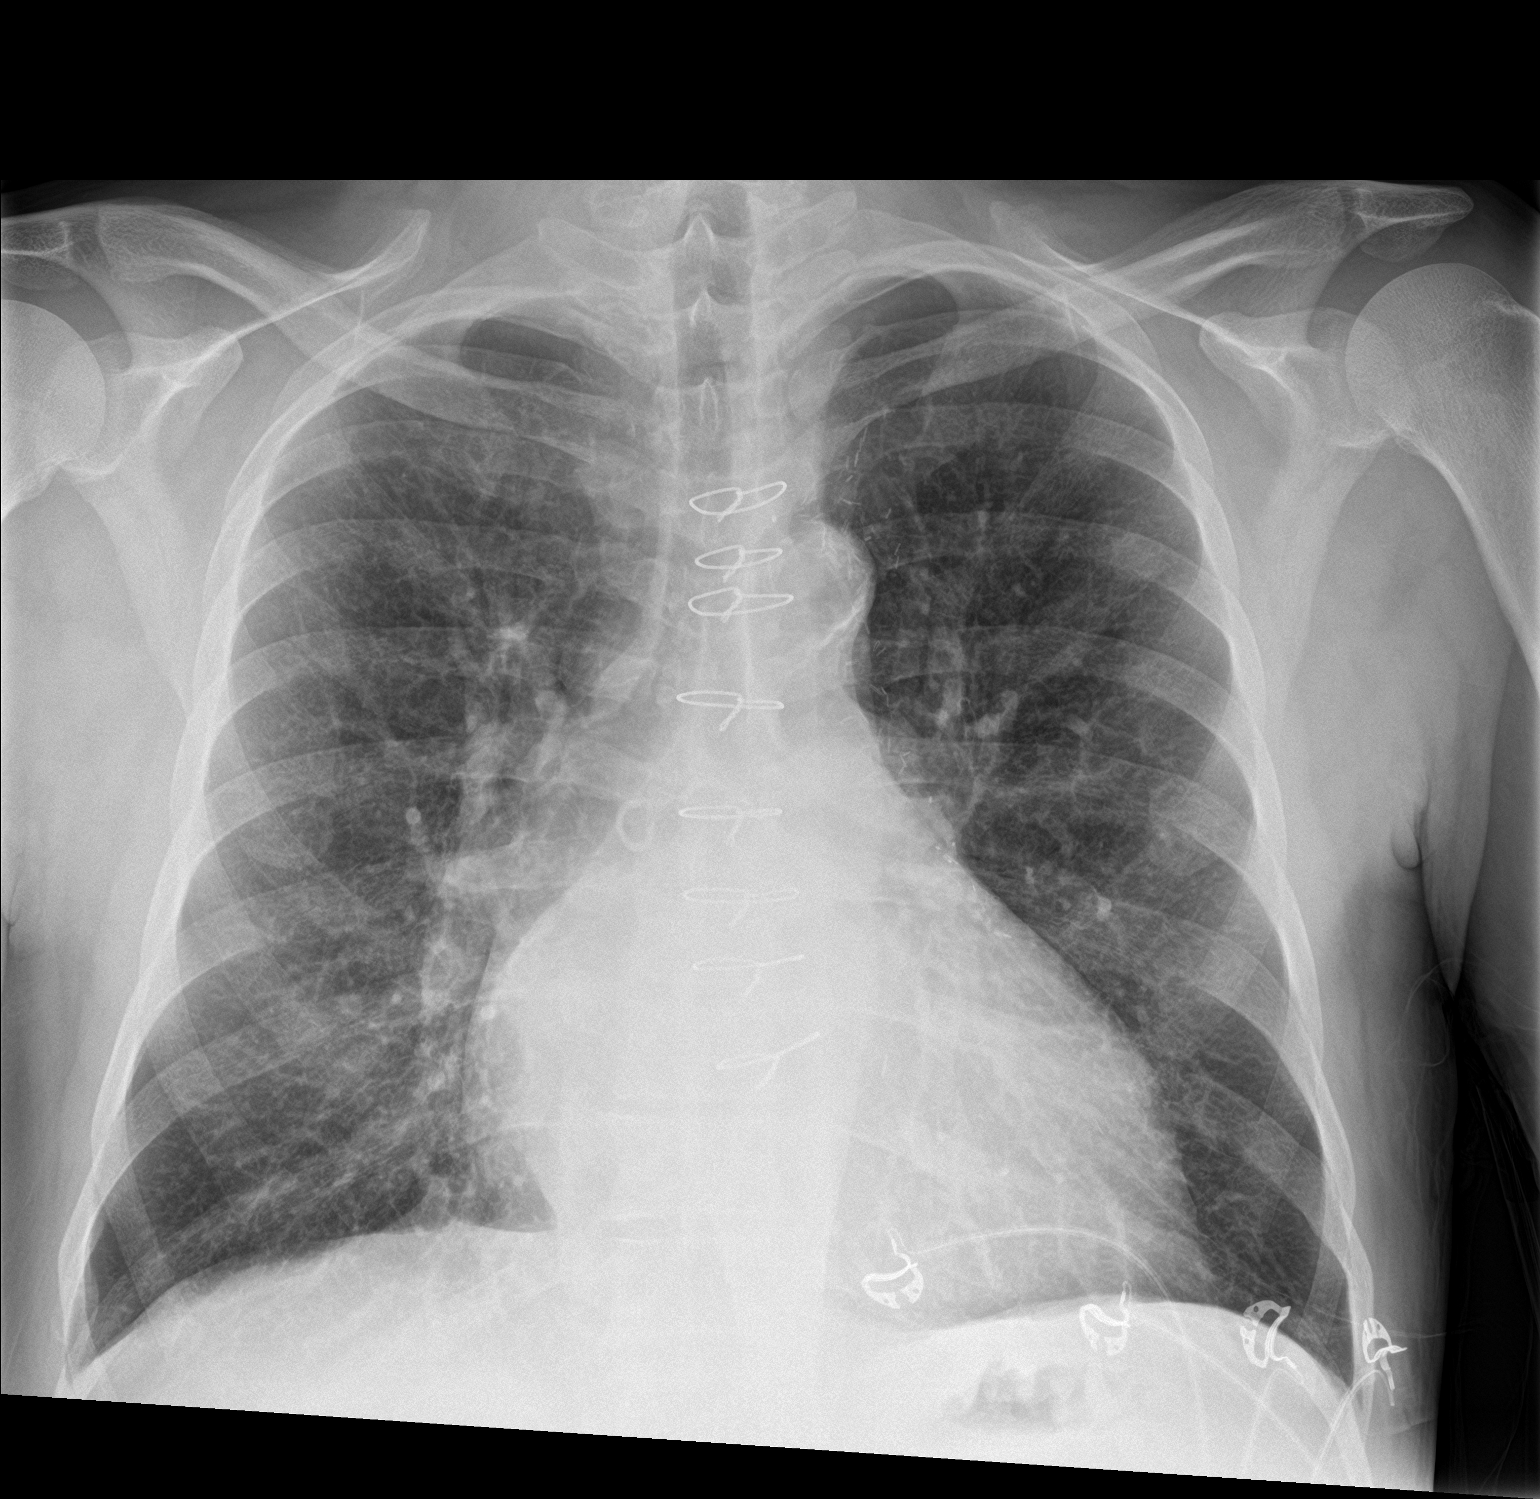

[chest lat]
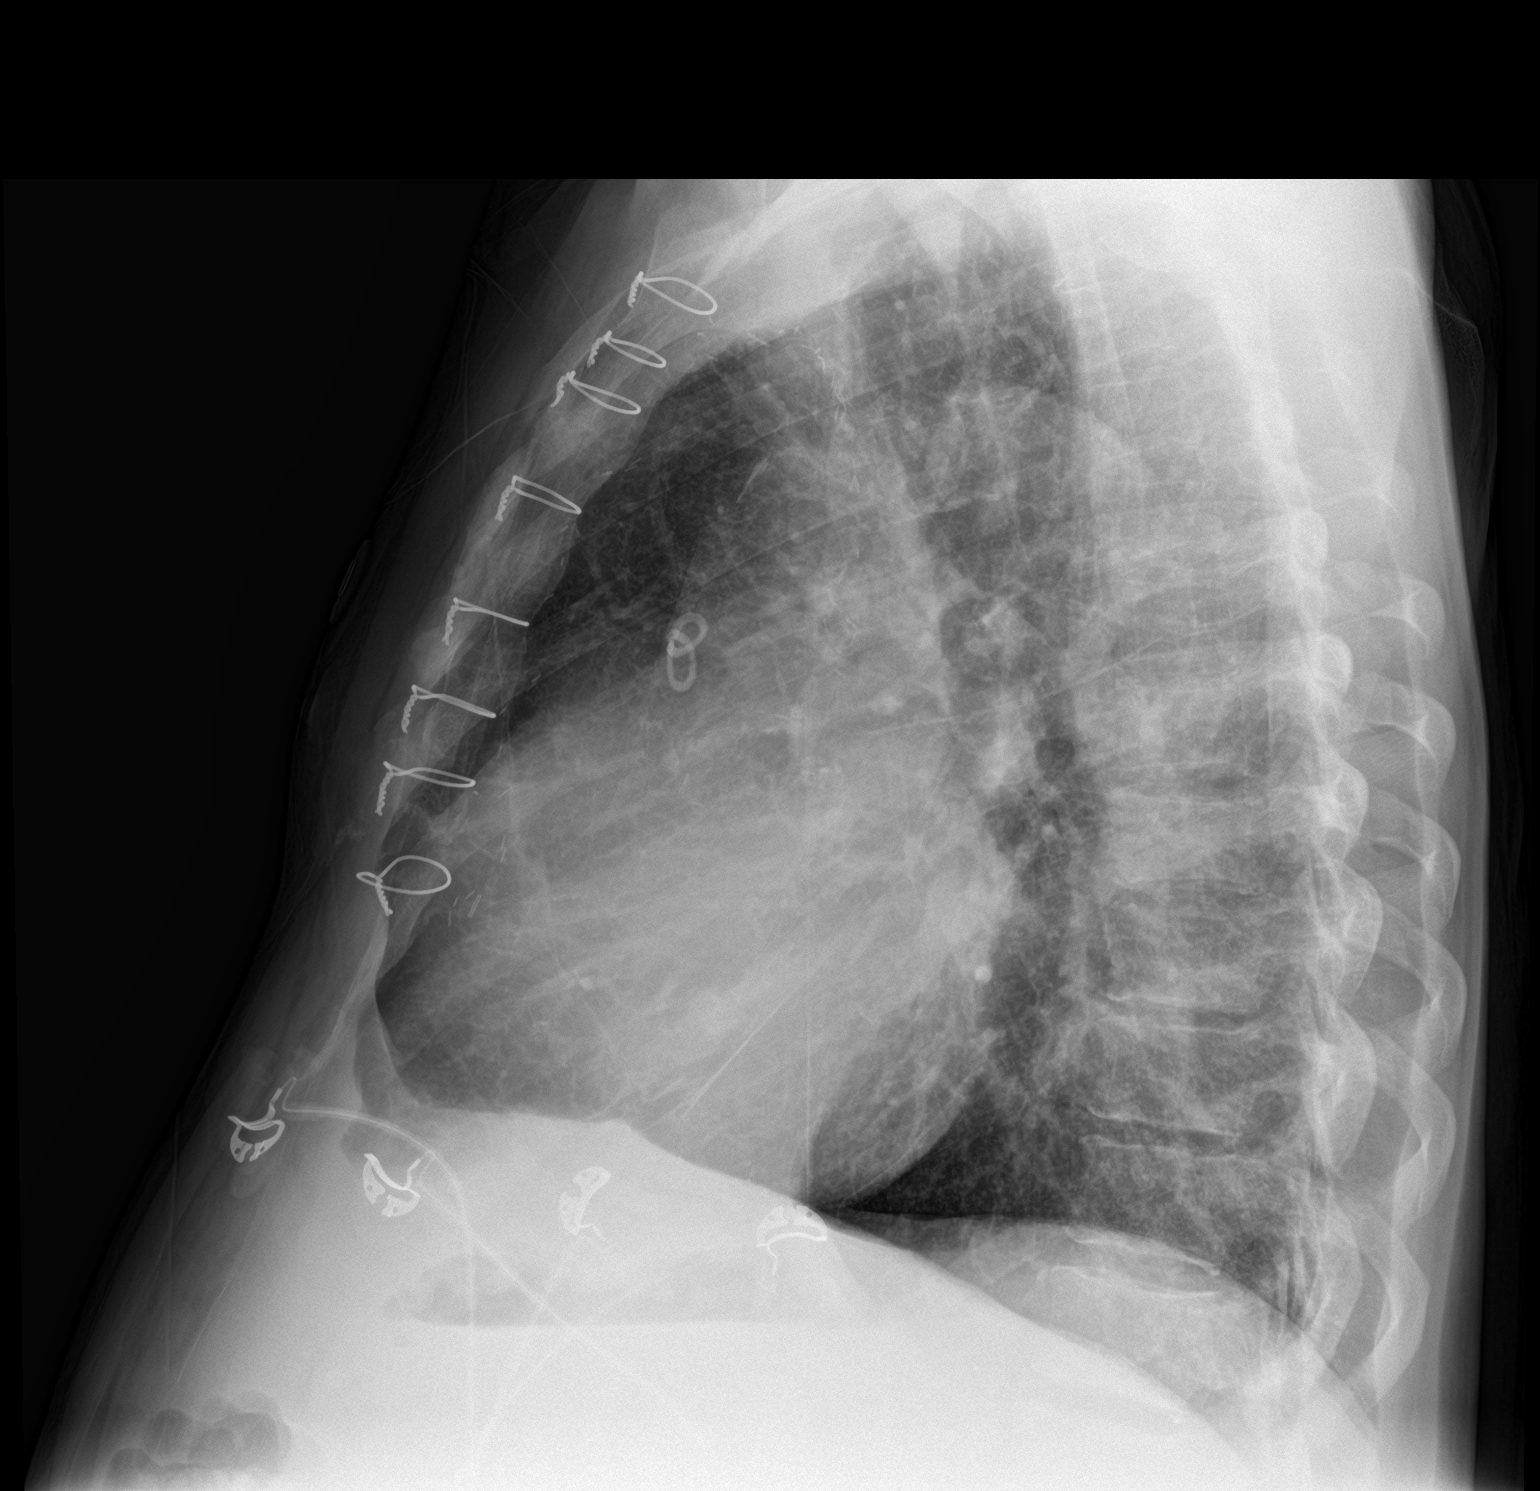

[2 of 2 positions shown; findings below may reference images not displayed]

FINDINGS: The lungs are well-aerated. Mild vascular congestion is noted.
Mildly increased interstitial markings may reflect minimal
interstitial edema. There is no evidence of pleural effusion or
pneumothorax.

The heart is borderline normal in size. The patient is status post
median sternotomy. No acute osseous abnormalities are seen.
IMPRESSION: Mild vascular congestion noted. Mildly increased interstitial
markings may reflect minimal interstitial edema.

## 2019-02-12 IMAGING — CR DG CHEST 2V
2 series · 2 of 2 positions shown · non-contrast
Comparison: July 06, 2016

CLINICAL DATA: Recent diagnosis of COPD.  Cough.

EXAM:
CHEST  2 VIEW

[chest pa]
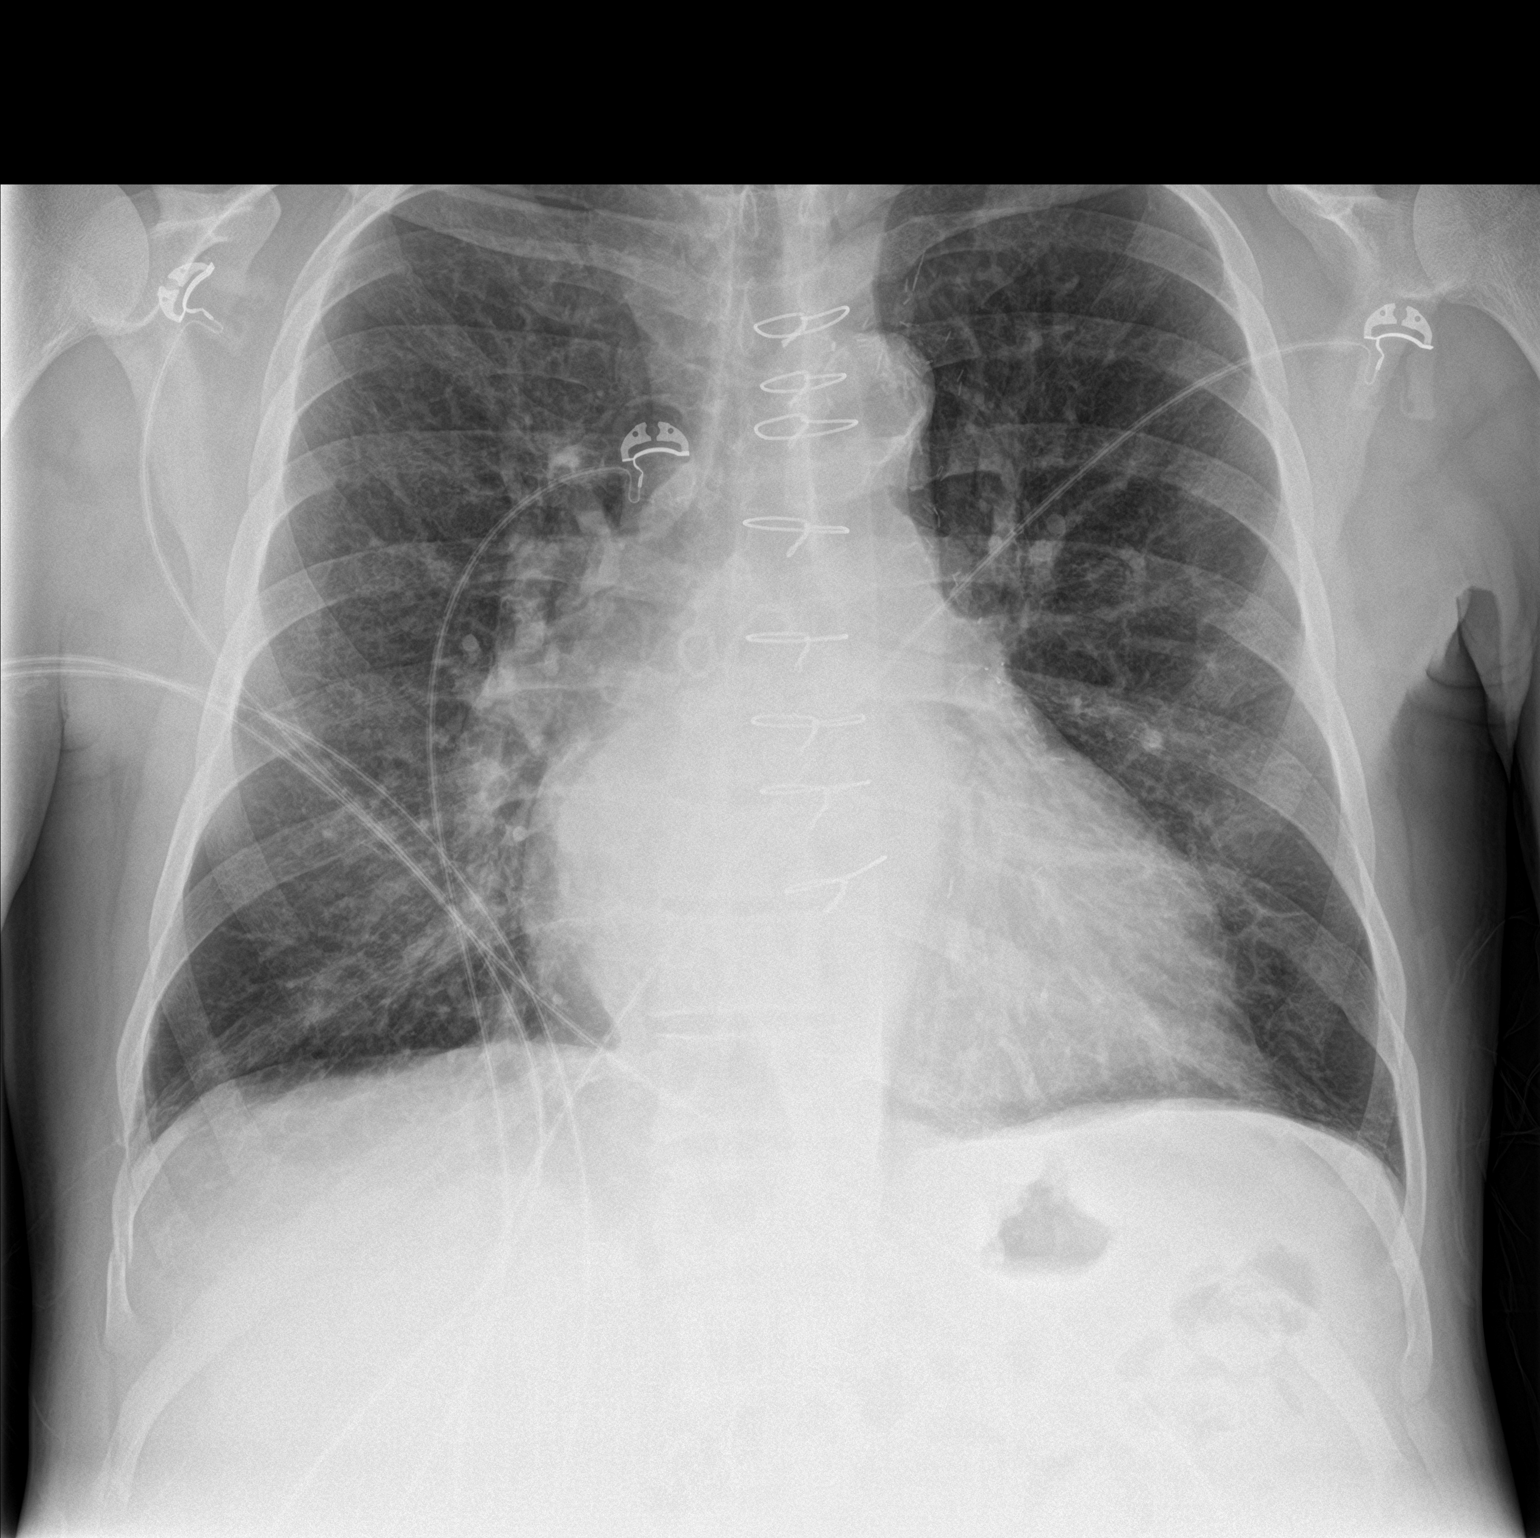

[chest lat]
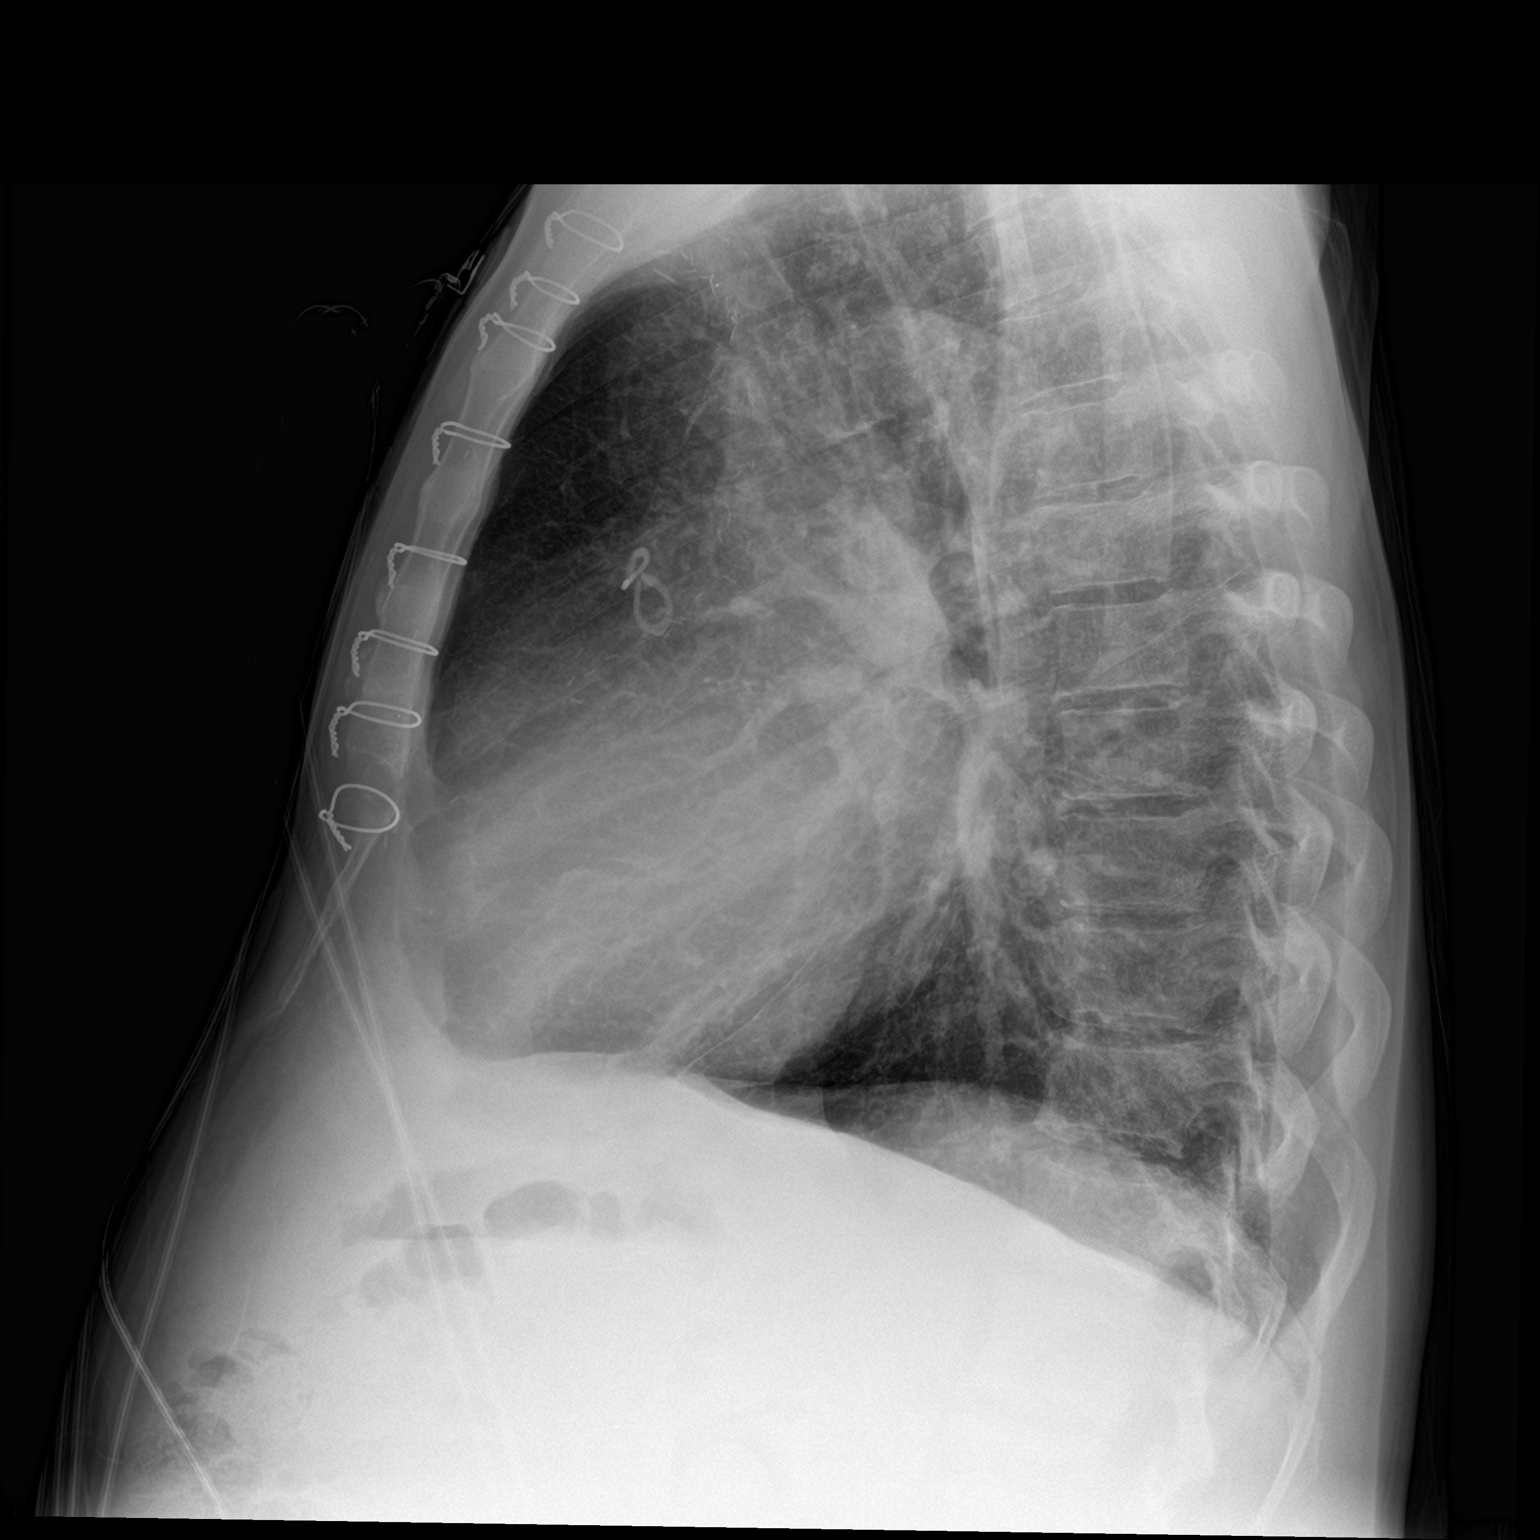

[2 of 2 positions shown; findings below may reference images not displayed]

FINDINGS: Mild cardiomegaly. The hila and mediastinum are normal. No
pneumothorax. No pulmonary nodules or masses. No focal infiltrates
identified. No overt edema.
IMPRESSION: No active cardiopulmonary disease.

## 2019-03-13 IMAGING — DX DG CHEST 1V PORT
1 series · 1 of 1 positions shown · non-contrast
Comparison: PA and lateral chest 07/11/2016, 07/06/2016 and
02/01/2016. CT chest 03/20/2016.

CLINICAL DATA: Leukocytosis.

EXAM:
PORTABLE CHEST 1 VIEW

[chest ap]
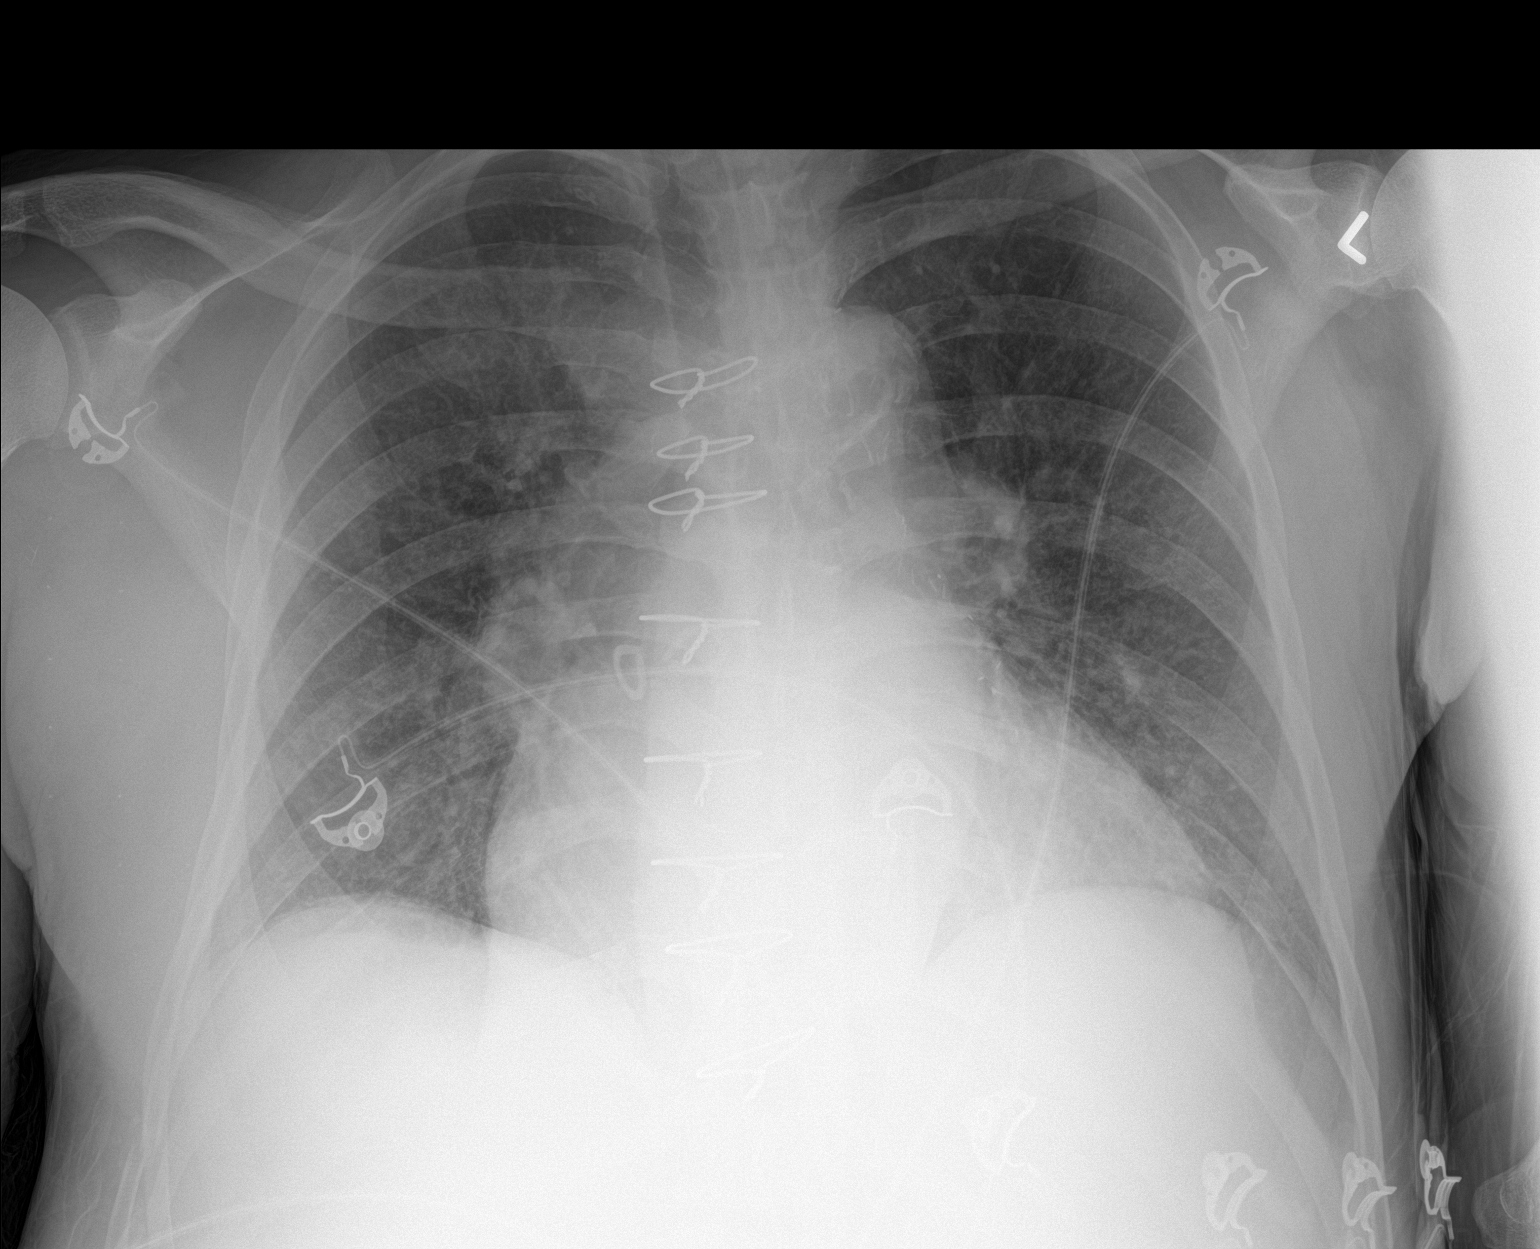

[1 of 1 positions shown; findings below may reference images not displayed]

FINDINGS: The patient is status post CABG. There is marked cardiomegaly. No
consolidative process, pneumothorax or effusion. Lung volumes are
somewhat lower than on the comparison examinations with chronic
bronchovascular structures. The patient is slightly rotated on the
exam.
IMPRESSION: Cardiomegaly without acute disease.

## 2019-03-20 IMAGING — CR DG CHEST 2V
2 series · 2 of 2 positions shown · non-contrast
Comparison: 08/09/2016, 07/11/2016 and earlier, including CTA chest
03/20/2016.

CLINICAL DATA: 56-year-old current smoker presenting with acute
onset of shortness of breath. Current history of hypertension and
coronary artery disease, post CABG in 8009.

EXAM:
CHEST  2 VIEW

[chest lat]
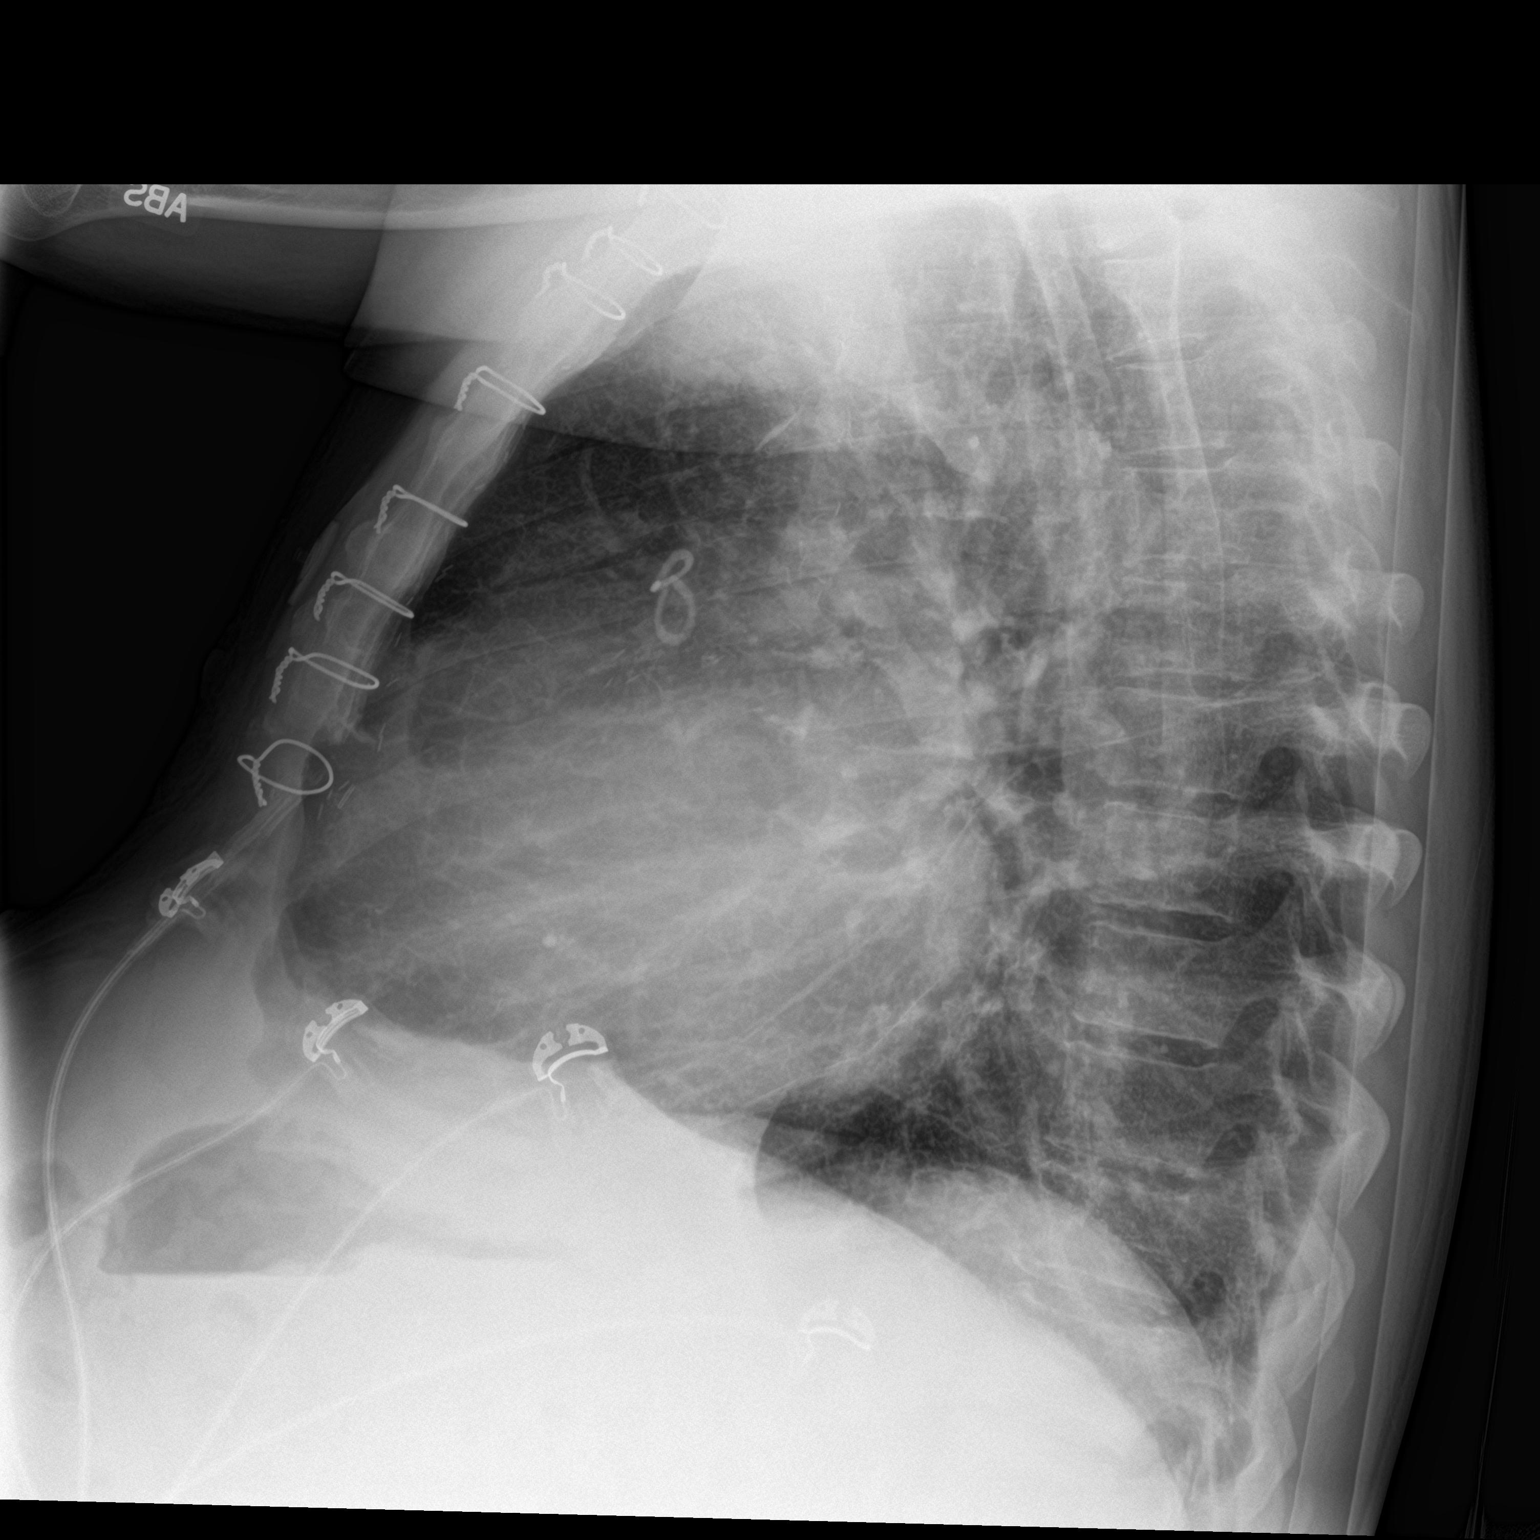

[chest ap]
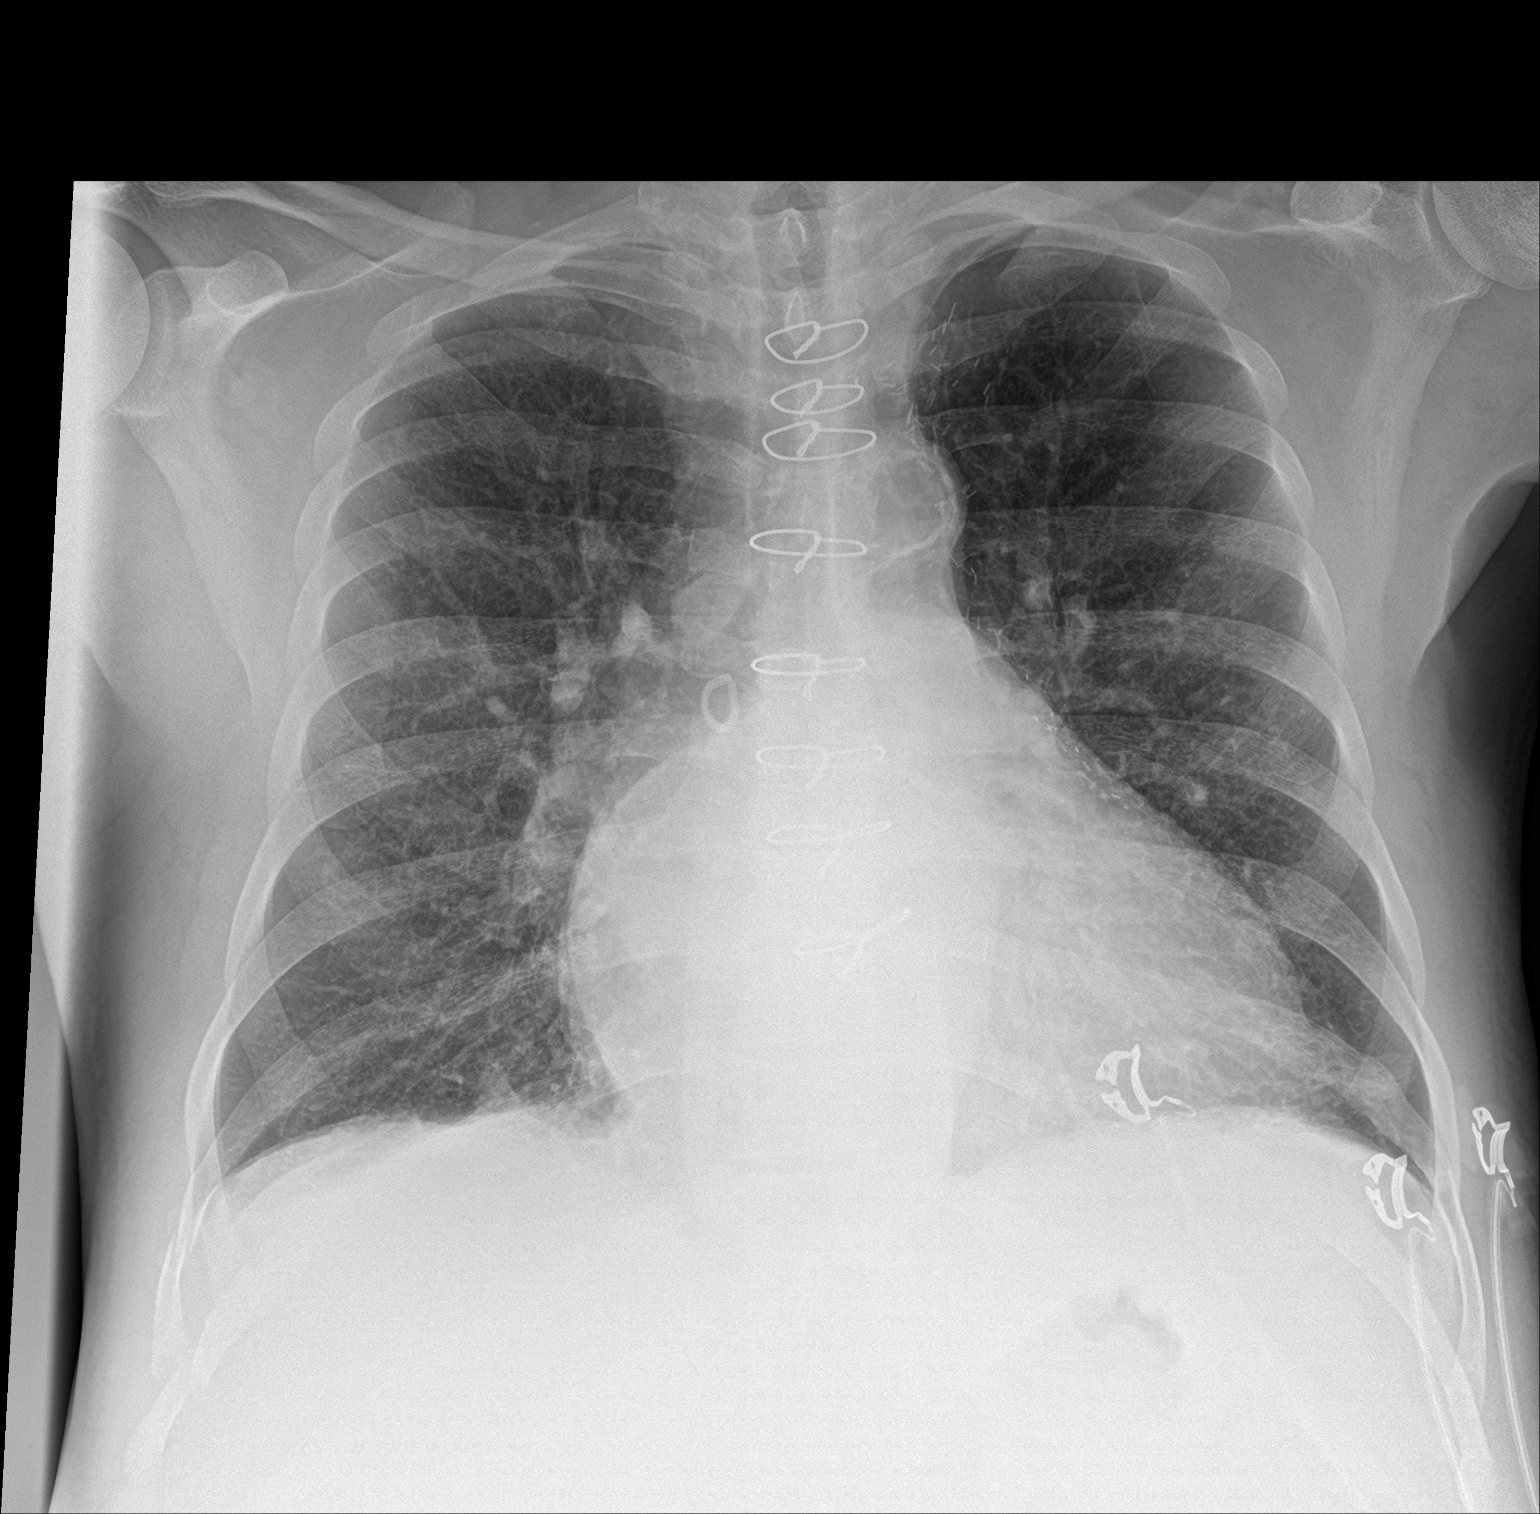

[2 of 2 positions shown; findings below may reference images not displayed]

FINDINGS: Prior sternotomy for CABG. Cardiac silhouette moderately enlarged,
unchanged dating back to 8287. Thoracic aorta atherosclerotic,
unchanged. Hilar and mediastinal contours otherwise unremarkable.
Emphysematous changes in the upper lobes, prominent bronchovascular
markings diffusely and moderate central peribronchial thickening,
unchanged. No new pulmonary parenchymal abnormalities. Normal
pulmonary vascularity. No pleural effusions. Visualized bony thorax
intact.
IMPRESSION: Stable cardiomegaly. COPD/emphysema. No acute cardiopulmonary
disease.

## 2019-03-22 IMAGING — US US PARACENTESIS
1 series · 5 of 5 positions shown · non-contrast
Comparison: none

INDICATION: Ascites

[Series 1: us paracentesis · 0.30mm/px · 5 of 5 slices shown]
[im 1/5]
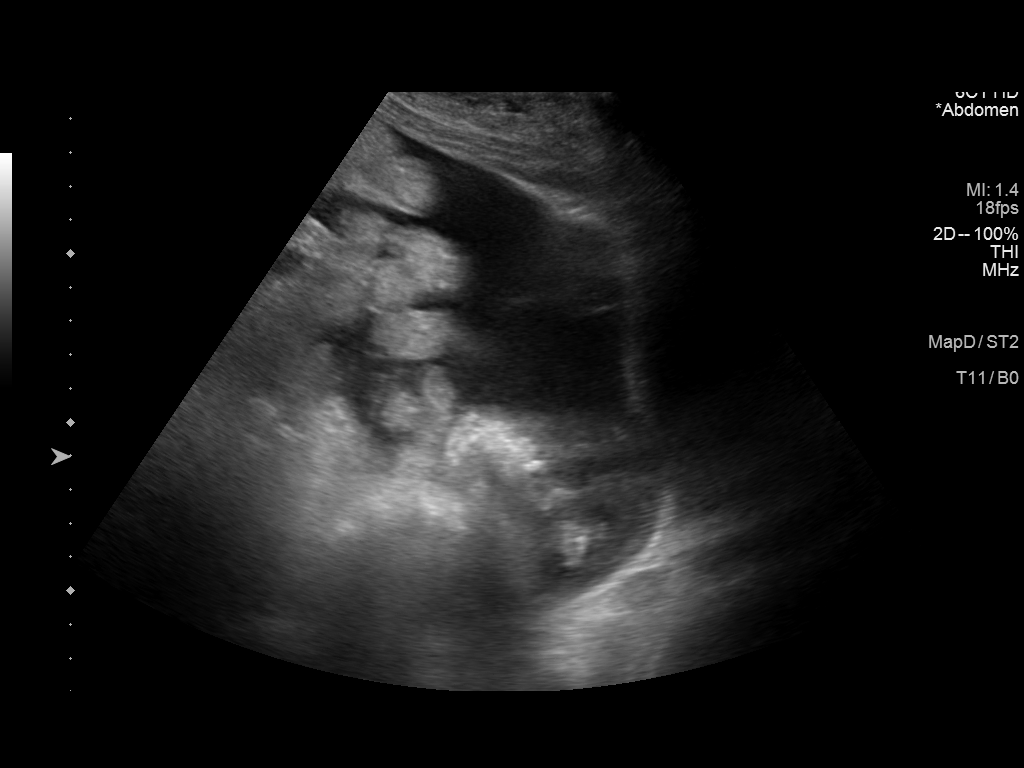
[im 2/5]
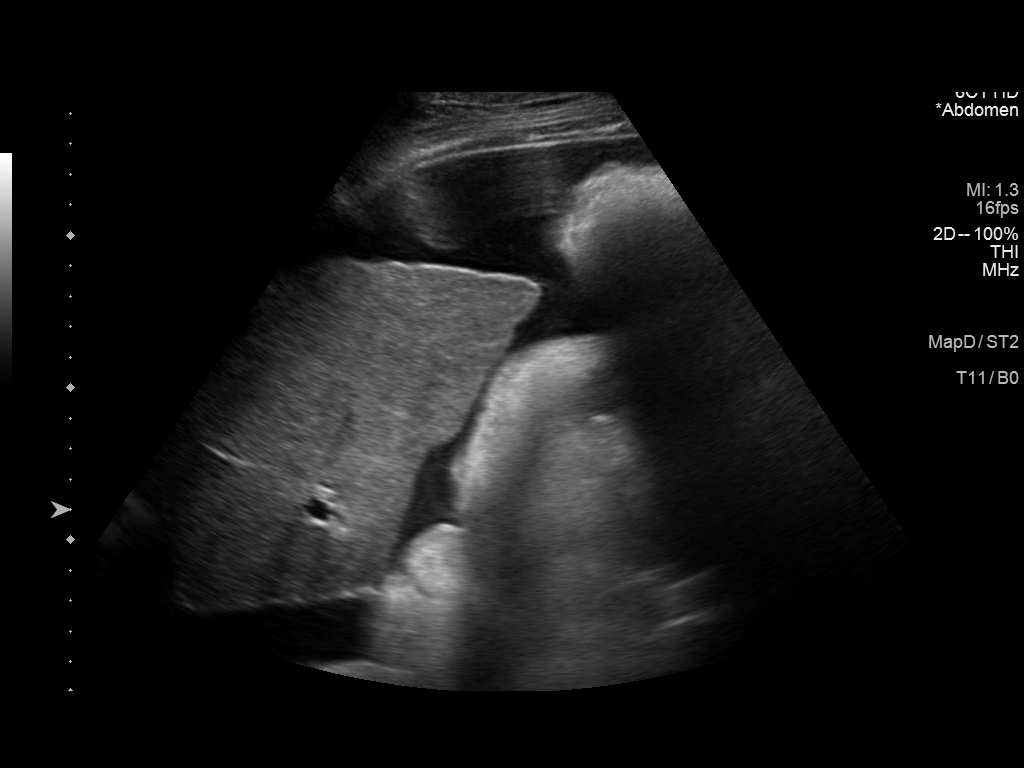
[im 3/5]
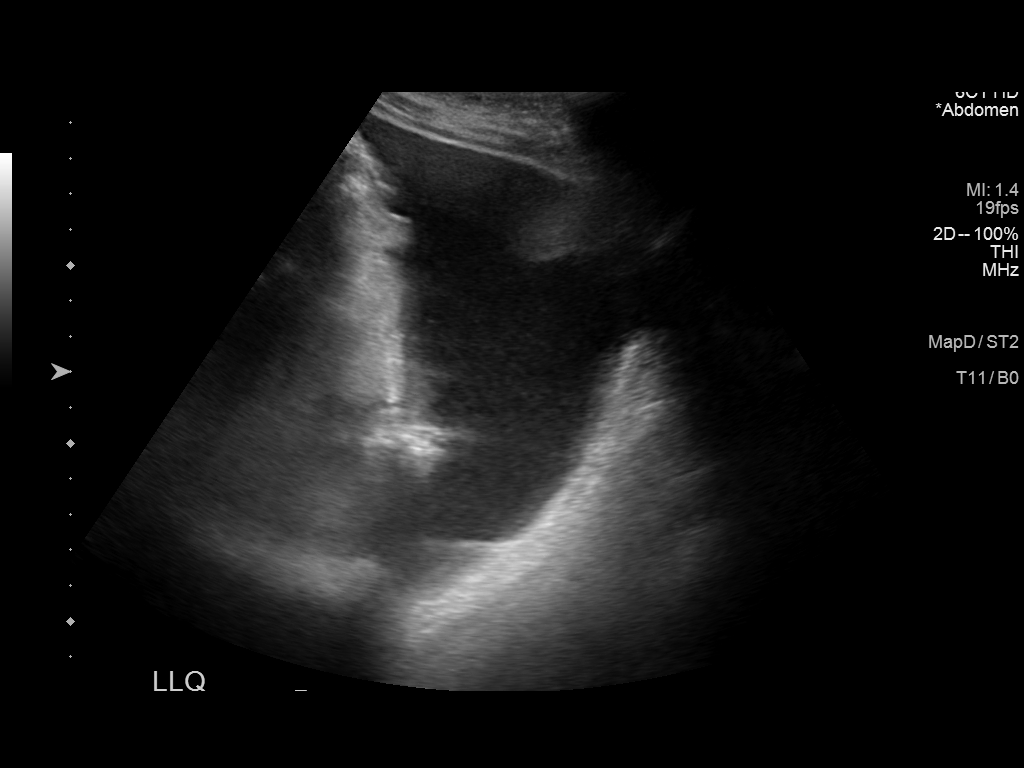
[im 4/5]
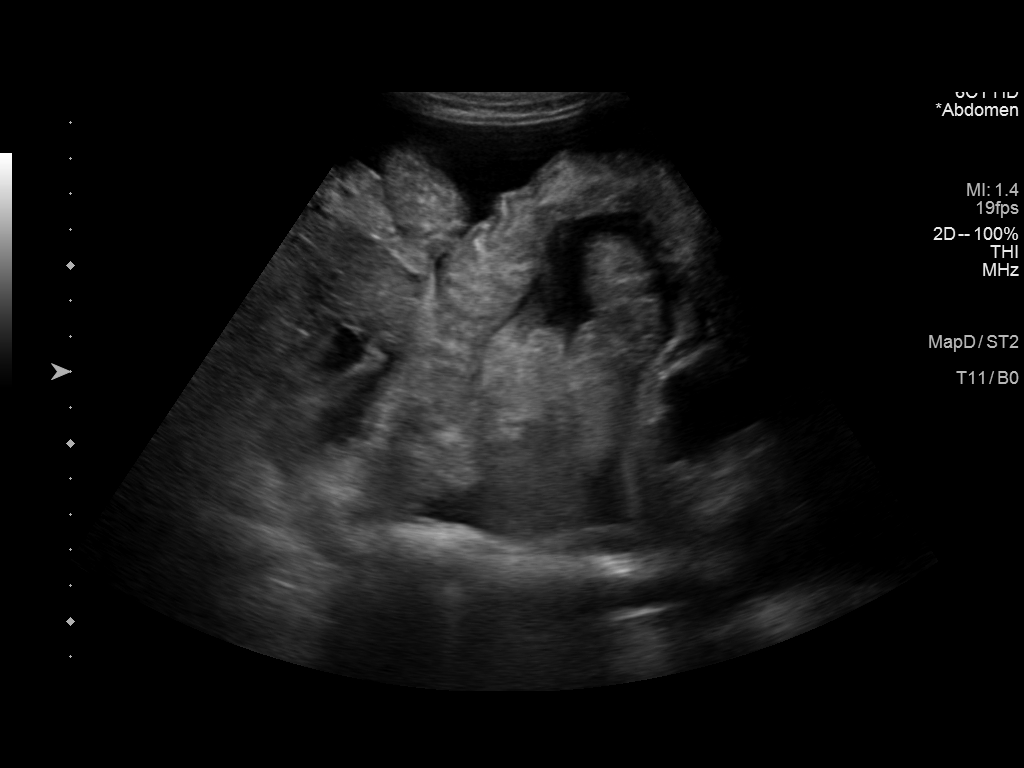
[im 5/5]
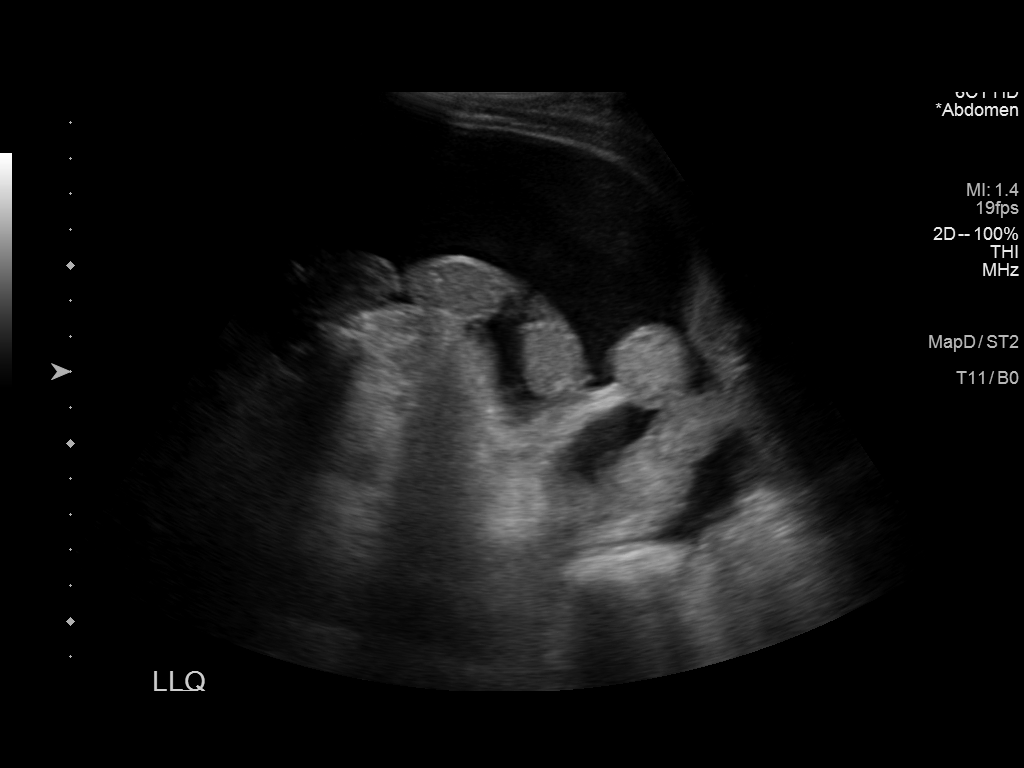

[5 of 5 positions shown; findings below may reference images not displayed]

EXAM:
ULTRASOUND GUIDED  PARACENTESIS

MEDICATIONS:
None.

COMPLICATIONS:
None immediate.

PROCEDURE:
Informed written consent was obtained from the patient after a
discussion of the risks, benefits and alternatives to treatment. A
timeout was performed prior to the initiation of the procedure.

Initial ultrasound scanning demonstrates a small amount of ascites
within the left lower abdominal quadrant. The right lower abdomen
was prepped and draped in the usual sterile fashion. 1% lidocaine
with epinephrine was used for local anesthesia.

Following this, a Safe-T-Centesis catheter was introduced. An
ultrasound image was saved for documentation purposes. The
paracentesis was performed. The catheter was removed and a dressing
was applied. The patient tolerated the procedure well without
immediate post procedural complication.
FINDINGS: A total of approximately 1.8 L of clear yellow fluid was removed.
IMPRESSION: Successful ultrasound-guided paracentesis yielding 1.8 liters of
peritoneal fluid.

## 2019-05-24 IMAGING — CR DG CHEST 2V
1 series · 2 of 2 positions shown · non-contrast
Comparison: 09/15/2016

CLINICAL DATA: Cough and shortness of breath

EXAM:
CHEST  2 VIEW

[Series 1: w chest pa · 0.14mm/px · 2 of 2 slices shown]
[im 1/2]
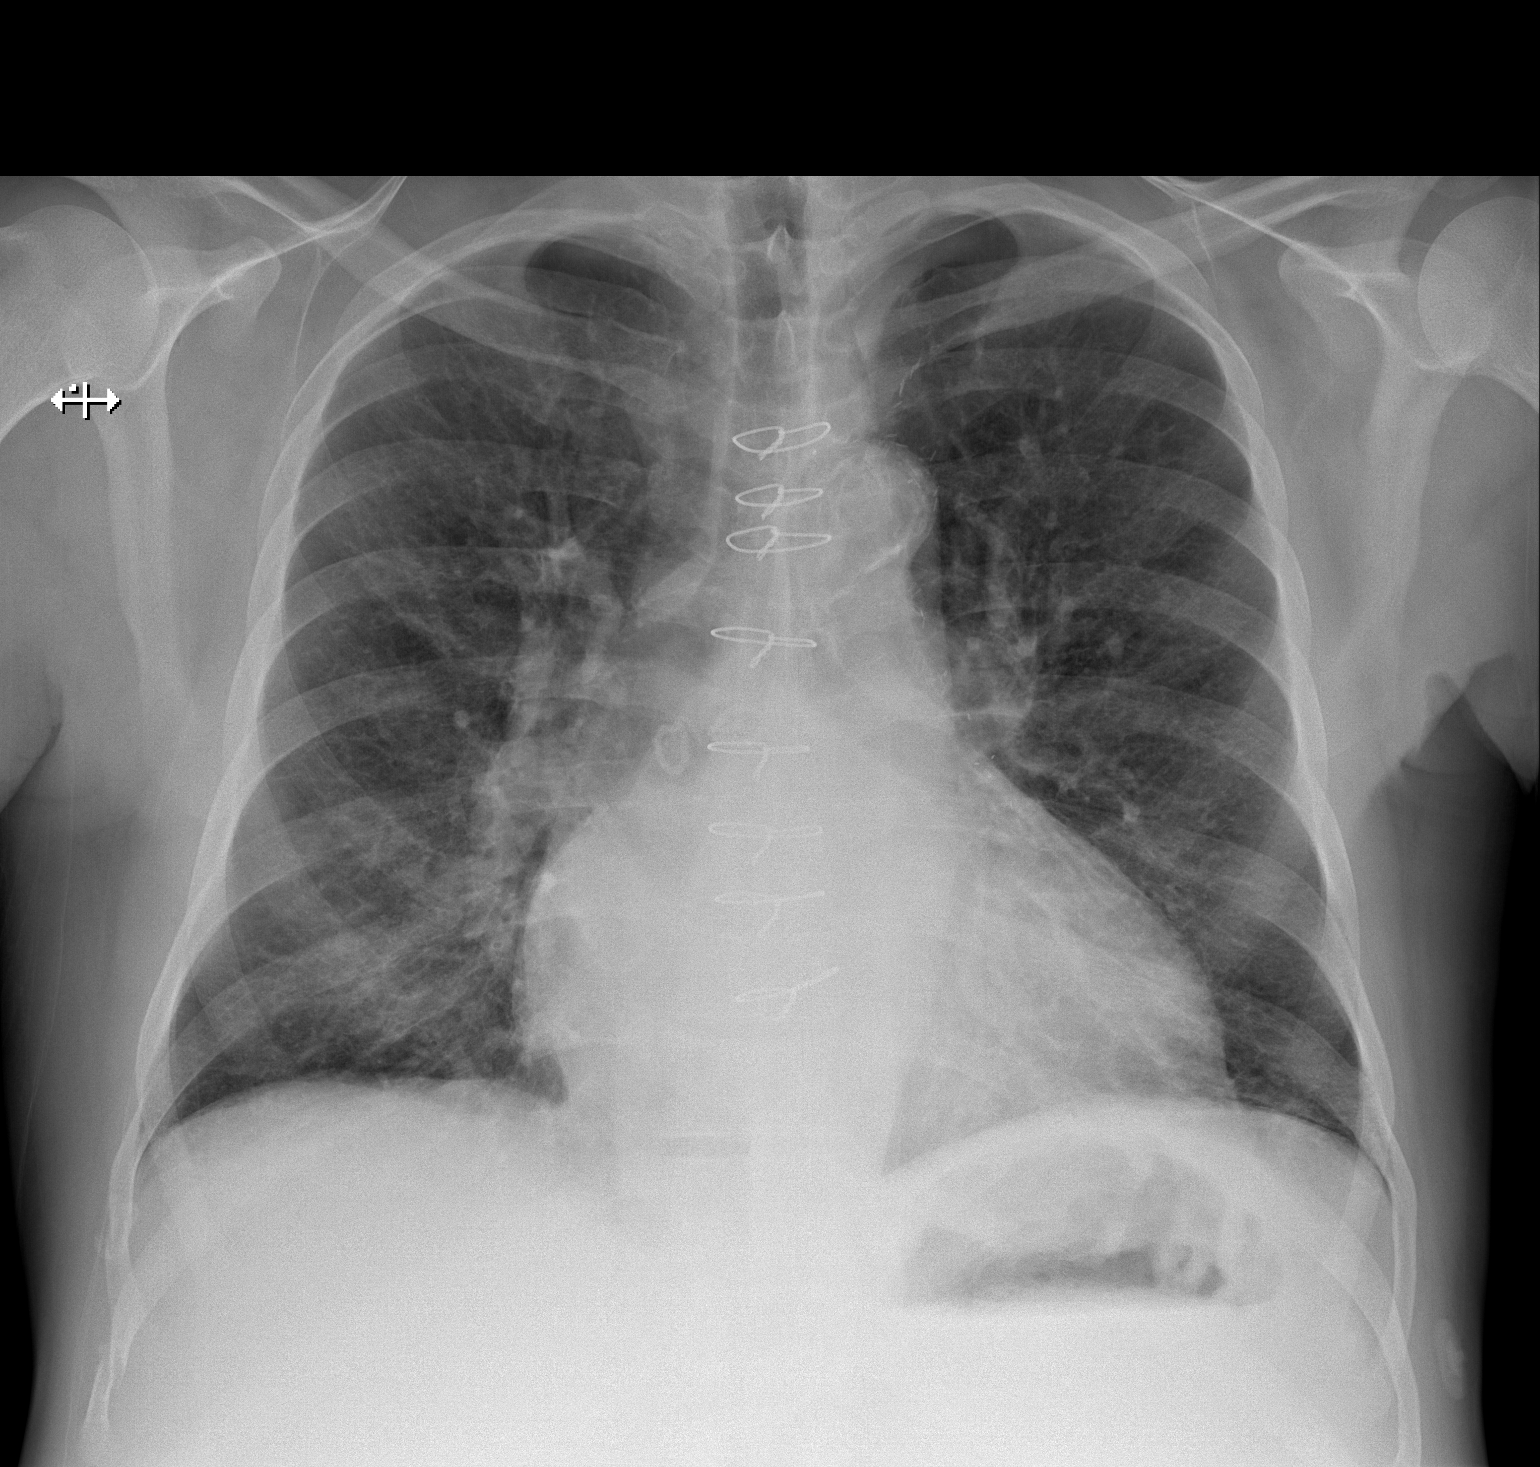
[im 2/2]
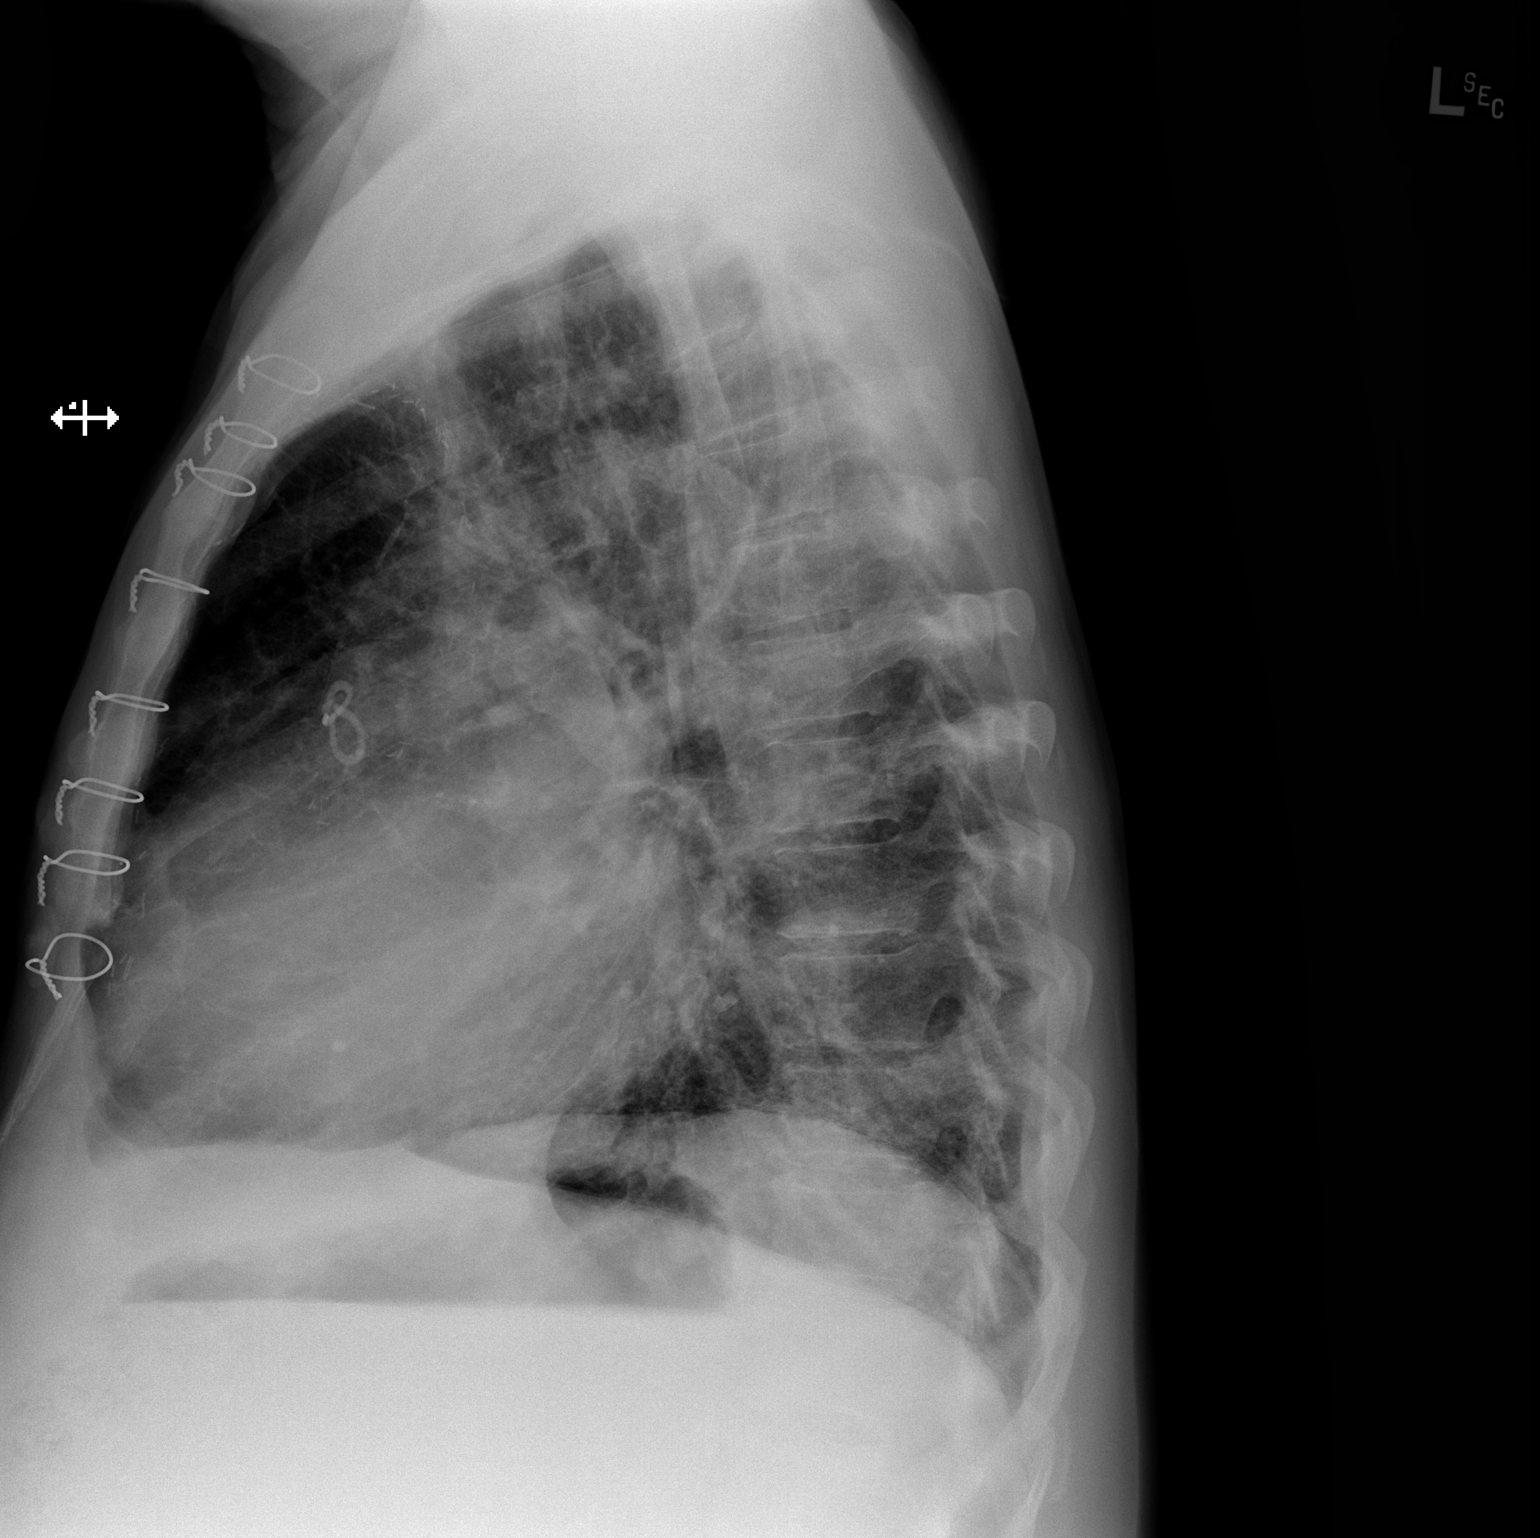

[2 of 2 positions shown; findings below may reference images not displayed]

FINDINGS: Chronic cardiomegaly. Patient is status post CABG. Stable
mediastinal contours. Pulmonary vascular congestion without Kerley
lines or effusion. Cephalized blood flow. Chronic interstitial
coarsening from emphysema. No asymmetric opacity or air bronchogram.
No acute osseous finding.
IMPRESSION: 1. Cardiomegaly and vascular congestion.
2. Chronic interstitial coarsening from emphysema.

## 2019-06-02 IMAGING — DX DG FOOT 2V*L*
2 series · 2 of 2 positions shown · non-contrast
Comparison: None.

CLINICAL DATA: Pain and swelling of the fifth digit.

EXAM:
LEFT FOOT - 2 VIEW

[foot ap]
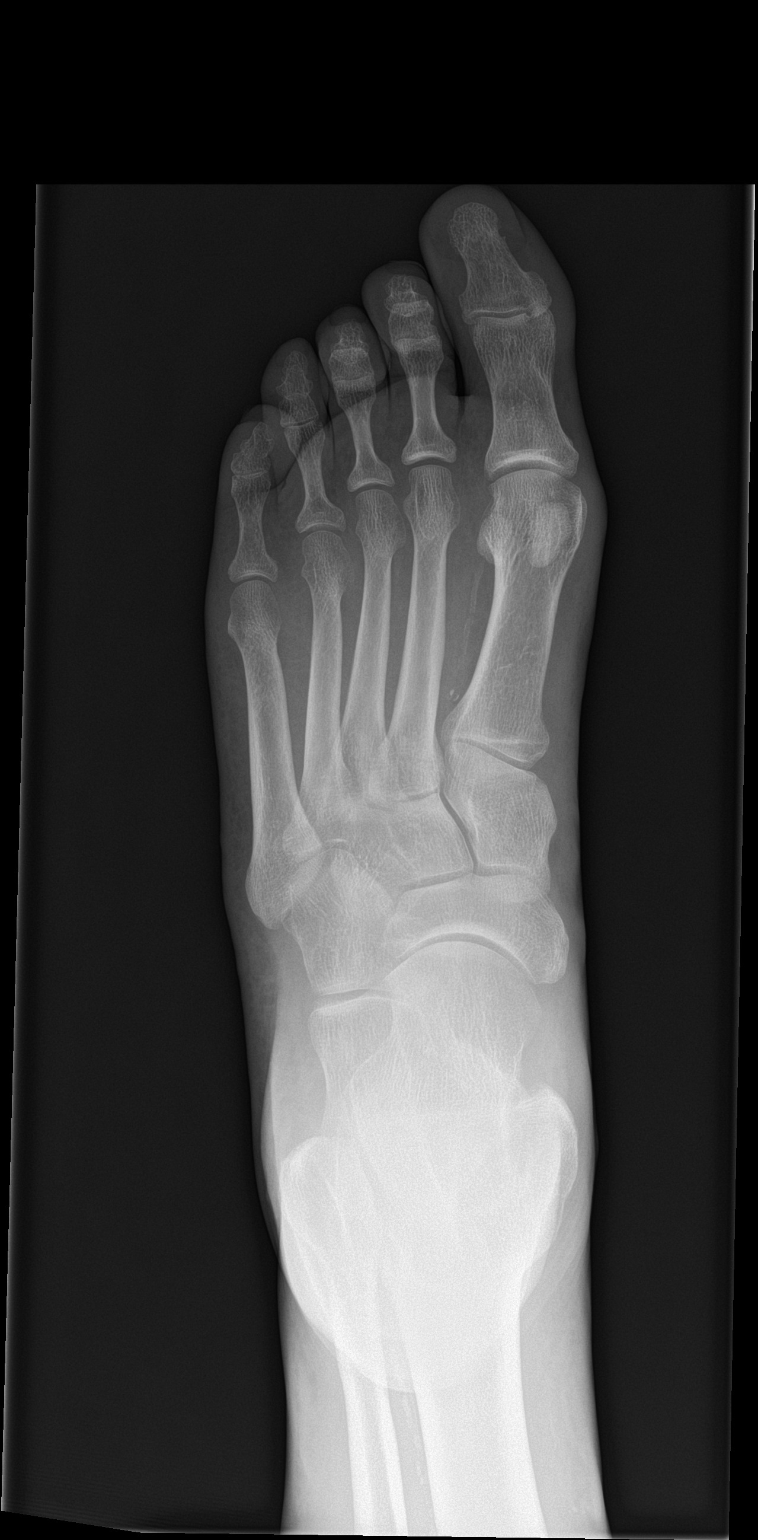

[foot lat]
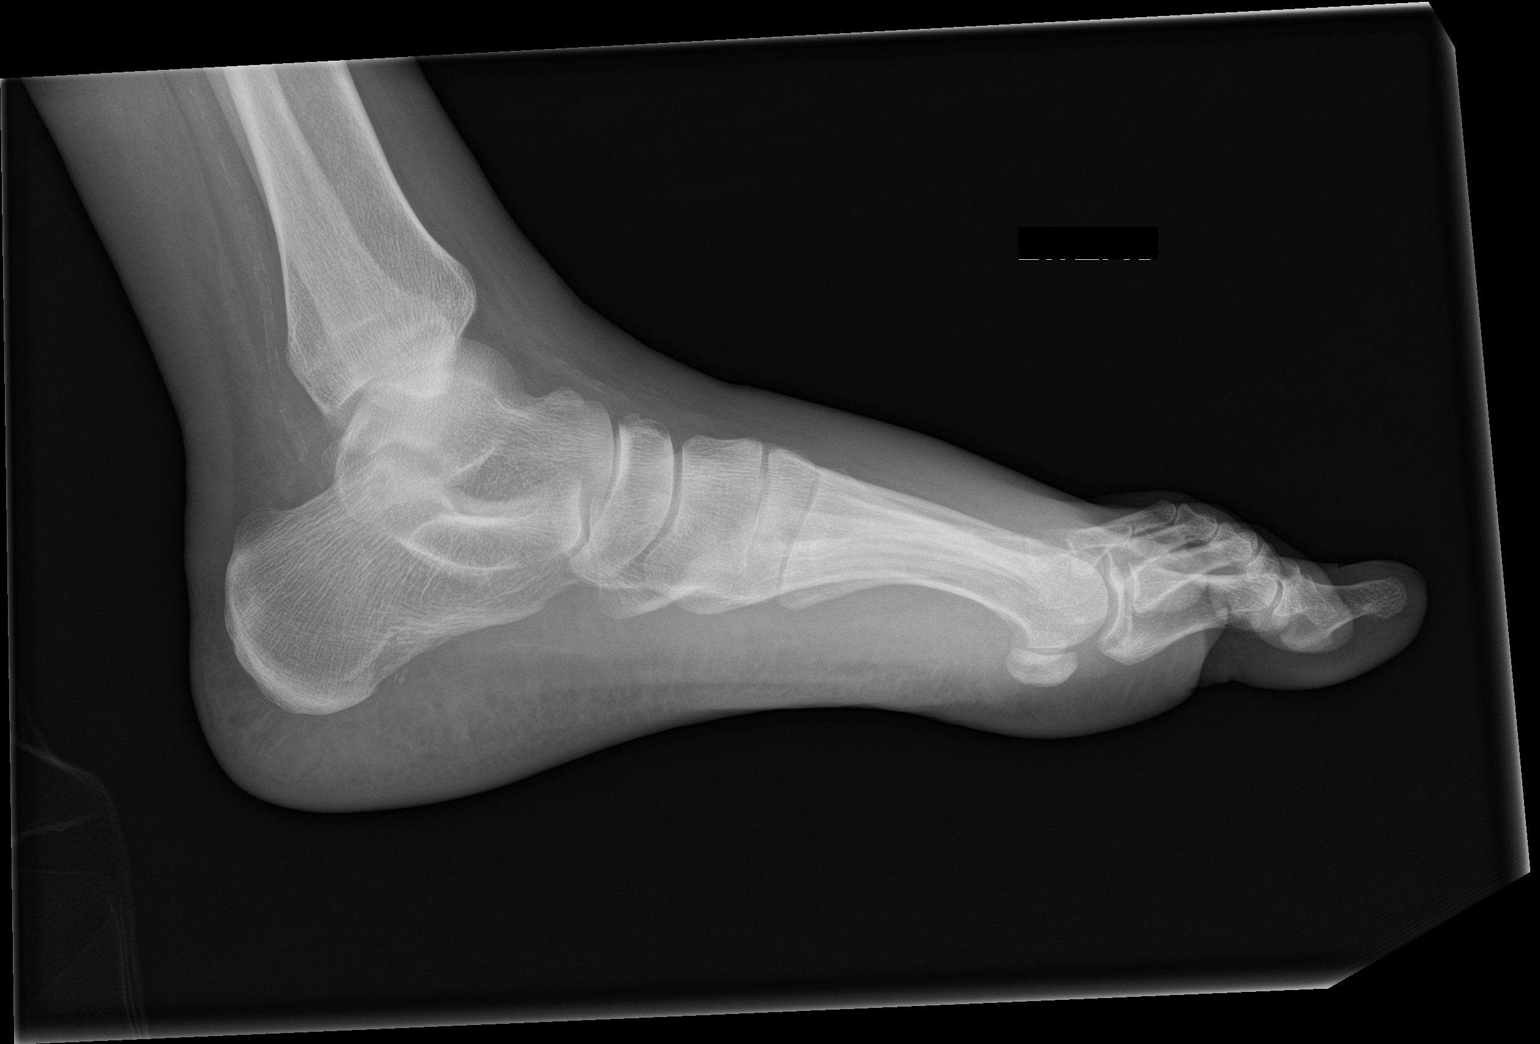

[2 of 2 positions shown; findings below may reference images not displayed]

FINDINGS: There is no evidence of fracture or dislocation. There is no
evidence of arthropathy or other focal bone abnormality. Mild soft
tissue swelling of the mid and forefoot.

Vascular calcifications noted.
IMPRESSION: No acute fracture or dislocation identified about the left foot.

Mild soft tissue swelling.

Vascular calcifications.

## 2019-06-02 IMAGING — CR DG CHEST 2V
1 series · 2 of 2 positions shown · non-contrast
Comparison: 10/20/2016

CLINICAL DATA: COPD.  Shortness of breath.

EXAM:
CHEST  2 VIEW

[Series 1: dg chest 2 view · 0.14mm/px · 2 of 2 slices shown]
[im 1/2]
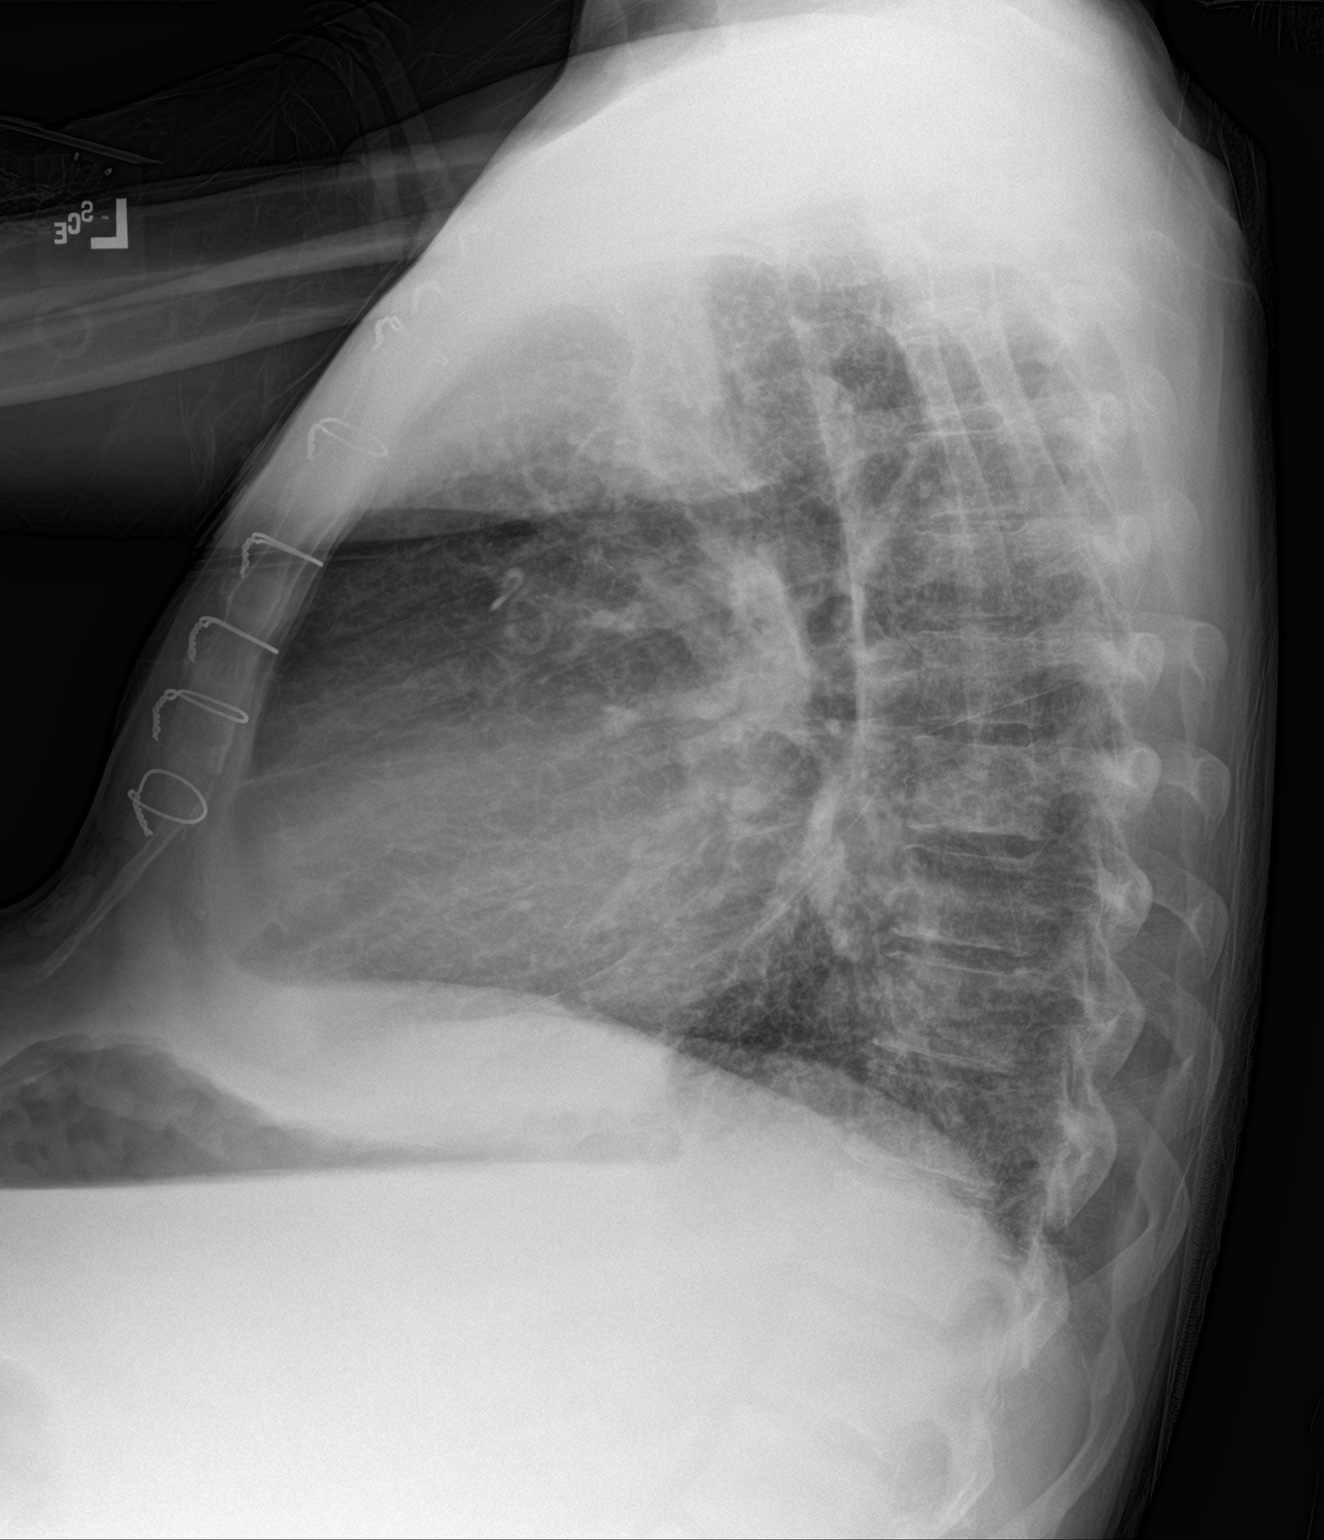
[im 2/2]
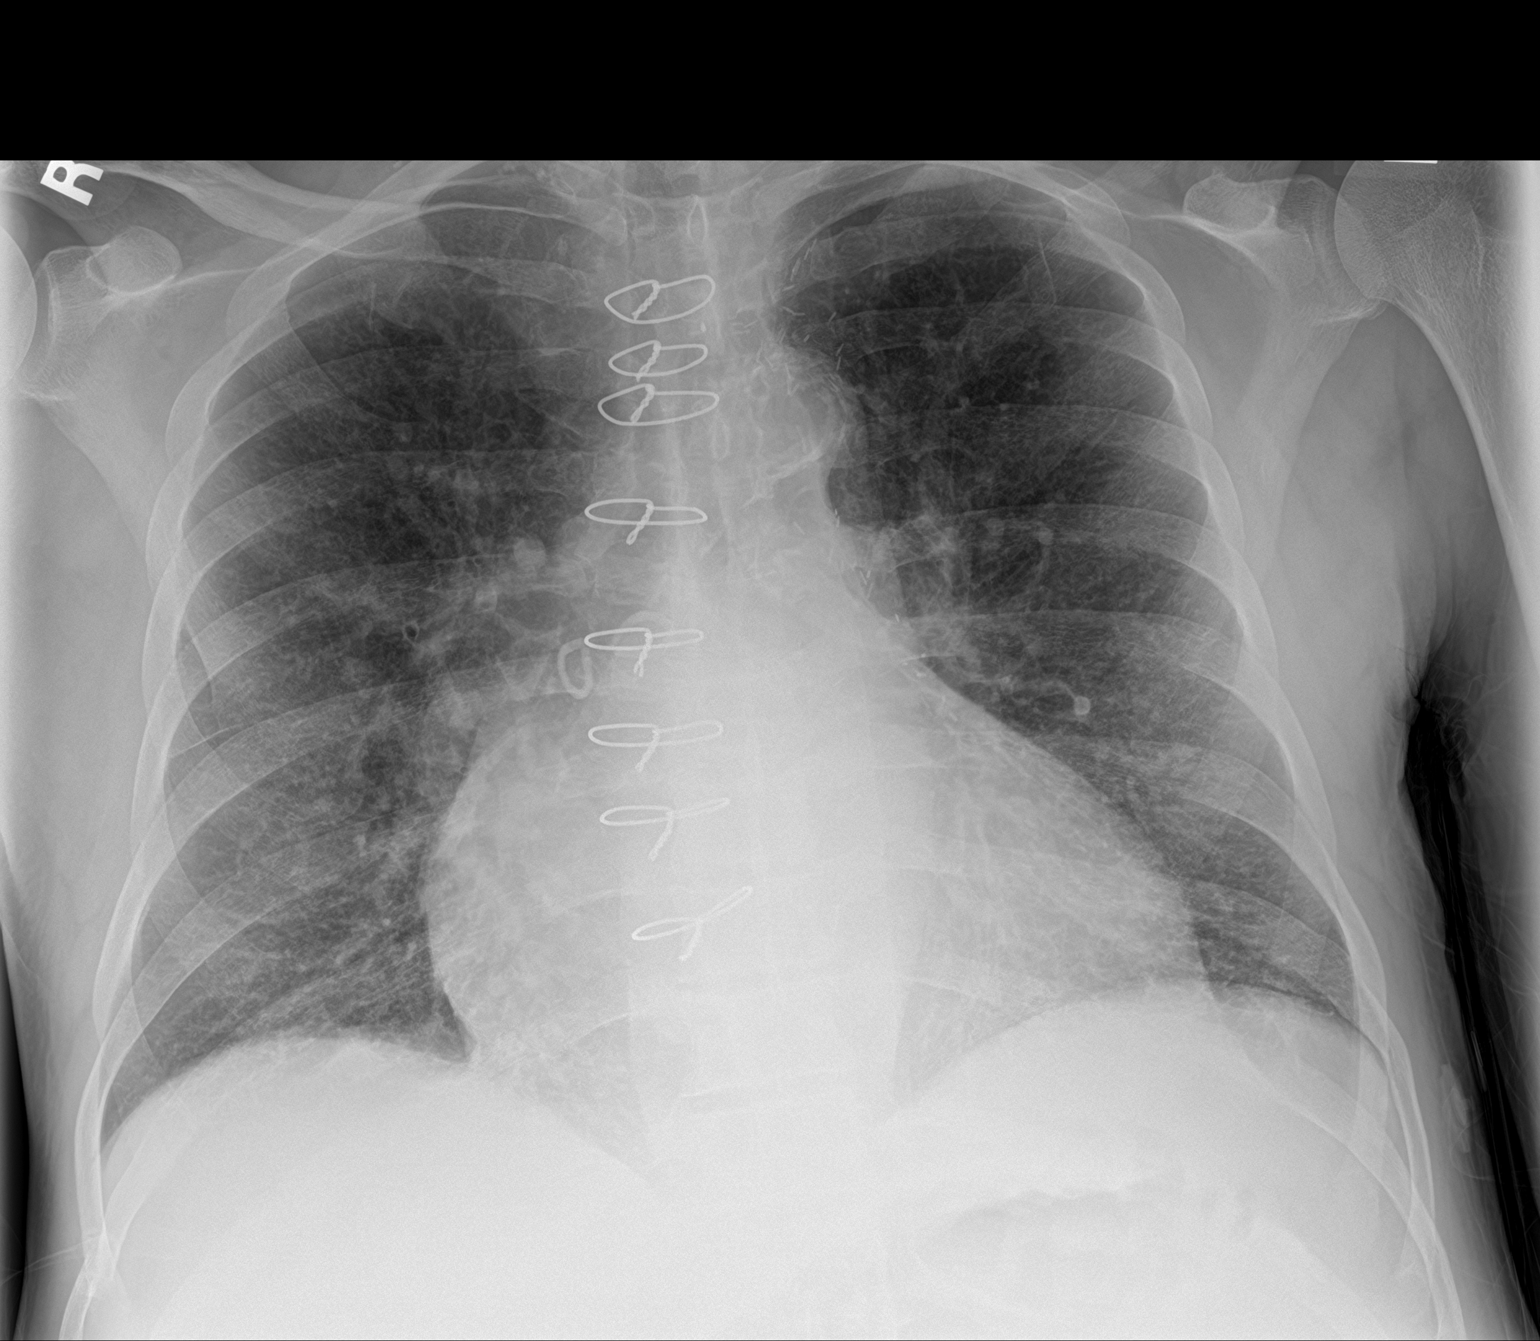

[2 of 2 positions shown; findings below may reference images not displayed]

FINDINGS: Bilateral diffuse interstitial thickening. No pleural effusion or
pneumothorax. No focal consolidation. Stable cardiomediastinal
silhouette. Prior CABG. No acute osseous abnormality.
IMPRESSION: Cardiomegaly with mild pulmonary vascular congestion.

## 2019-06-19 IMAGING — CR DG CHEST 2V
1 series · 2 of 2 positions shown · non-contrast
Comparison: 10/29/2016

CLINICAL DATA: Shortness of breath.  On dialysis.  Cirrhosis.

EXAM:
CHEST  2 VIEW

[Series 1: dg chest 2 view · 0.14mm/px · 2 of 2 slices shown]
[im 1/2]
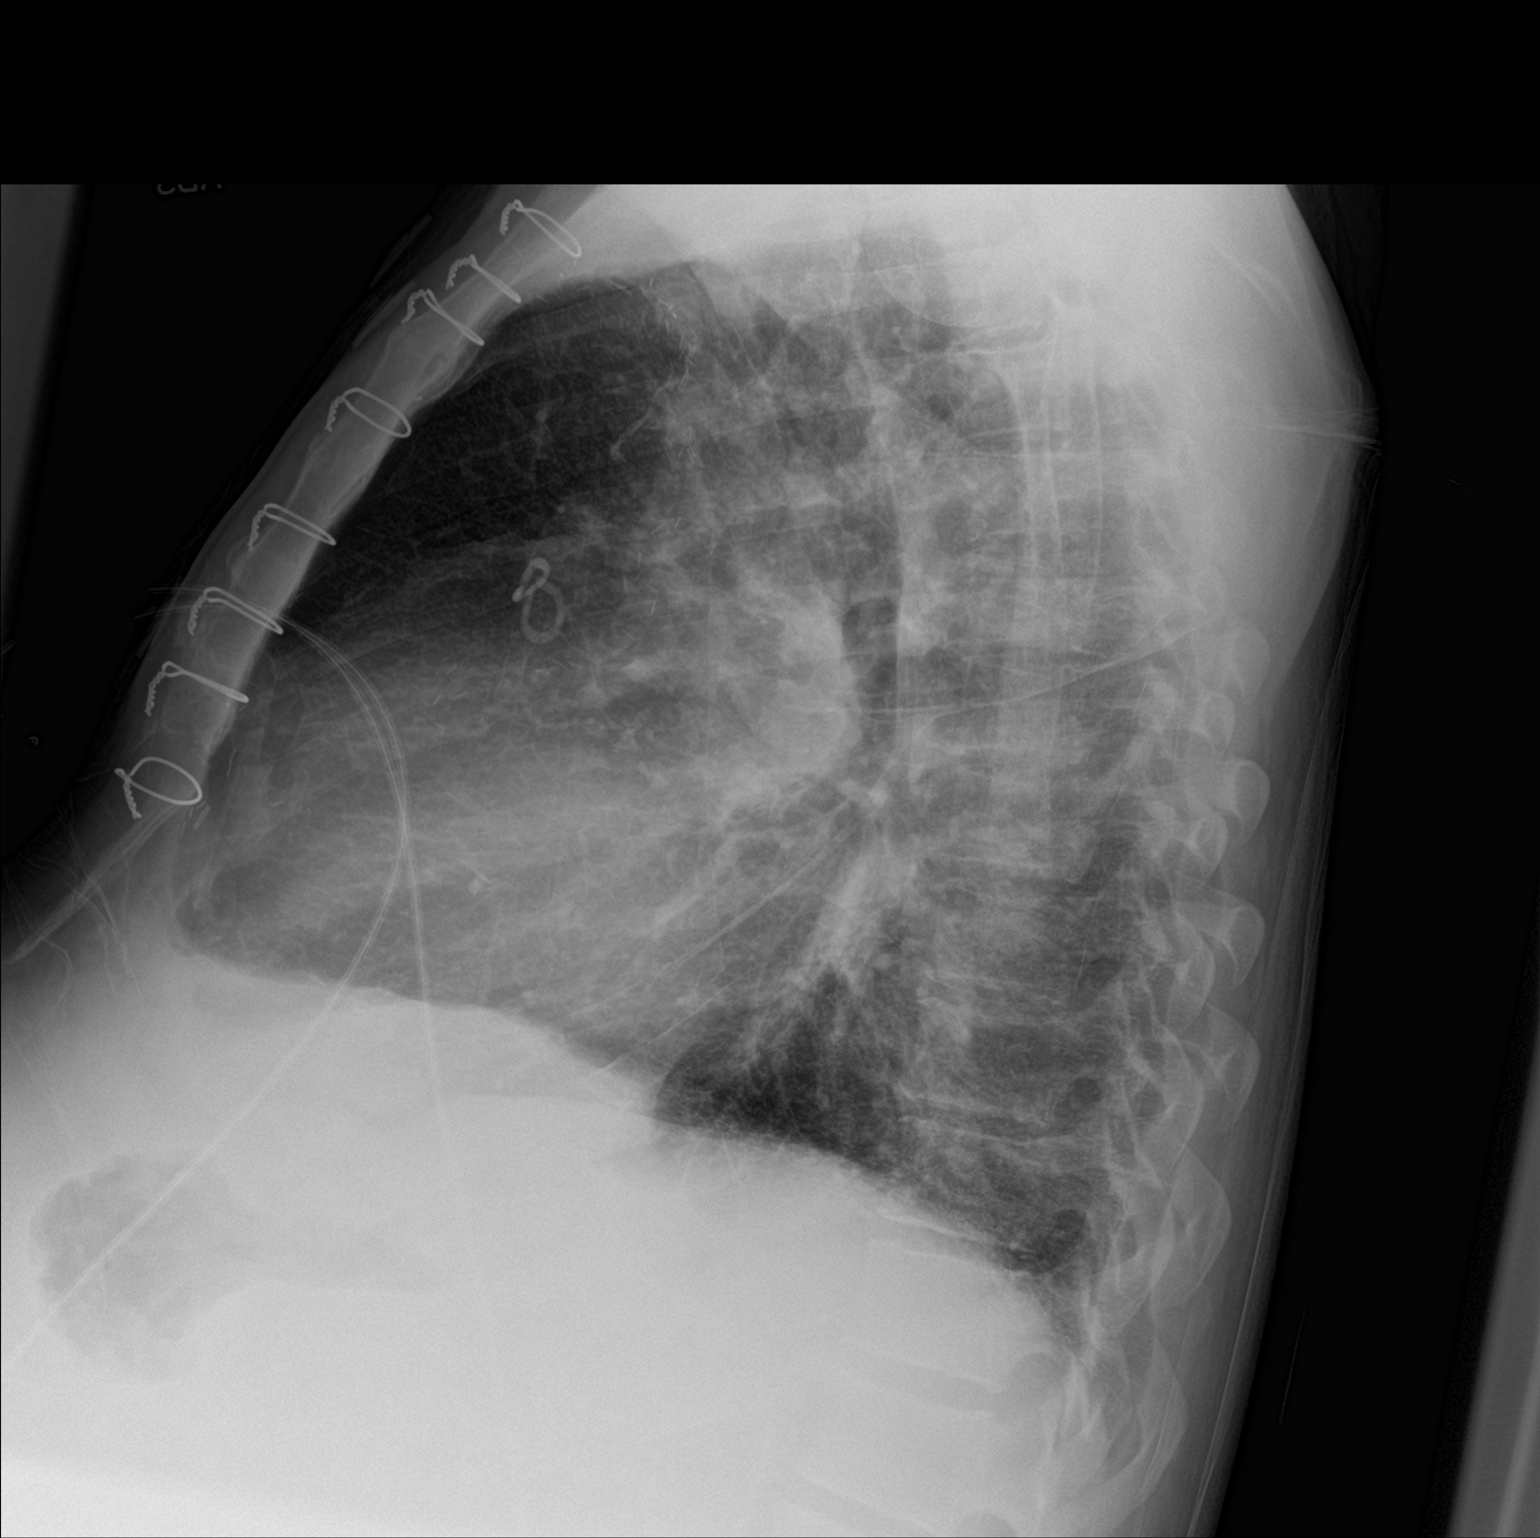
[im 2/2]
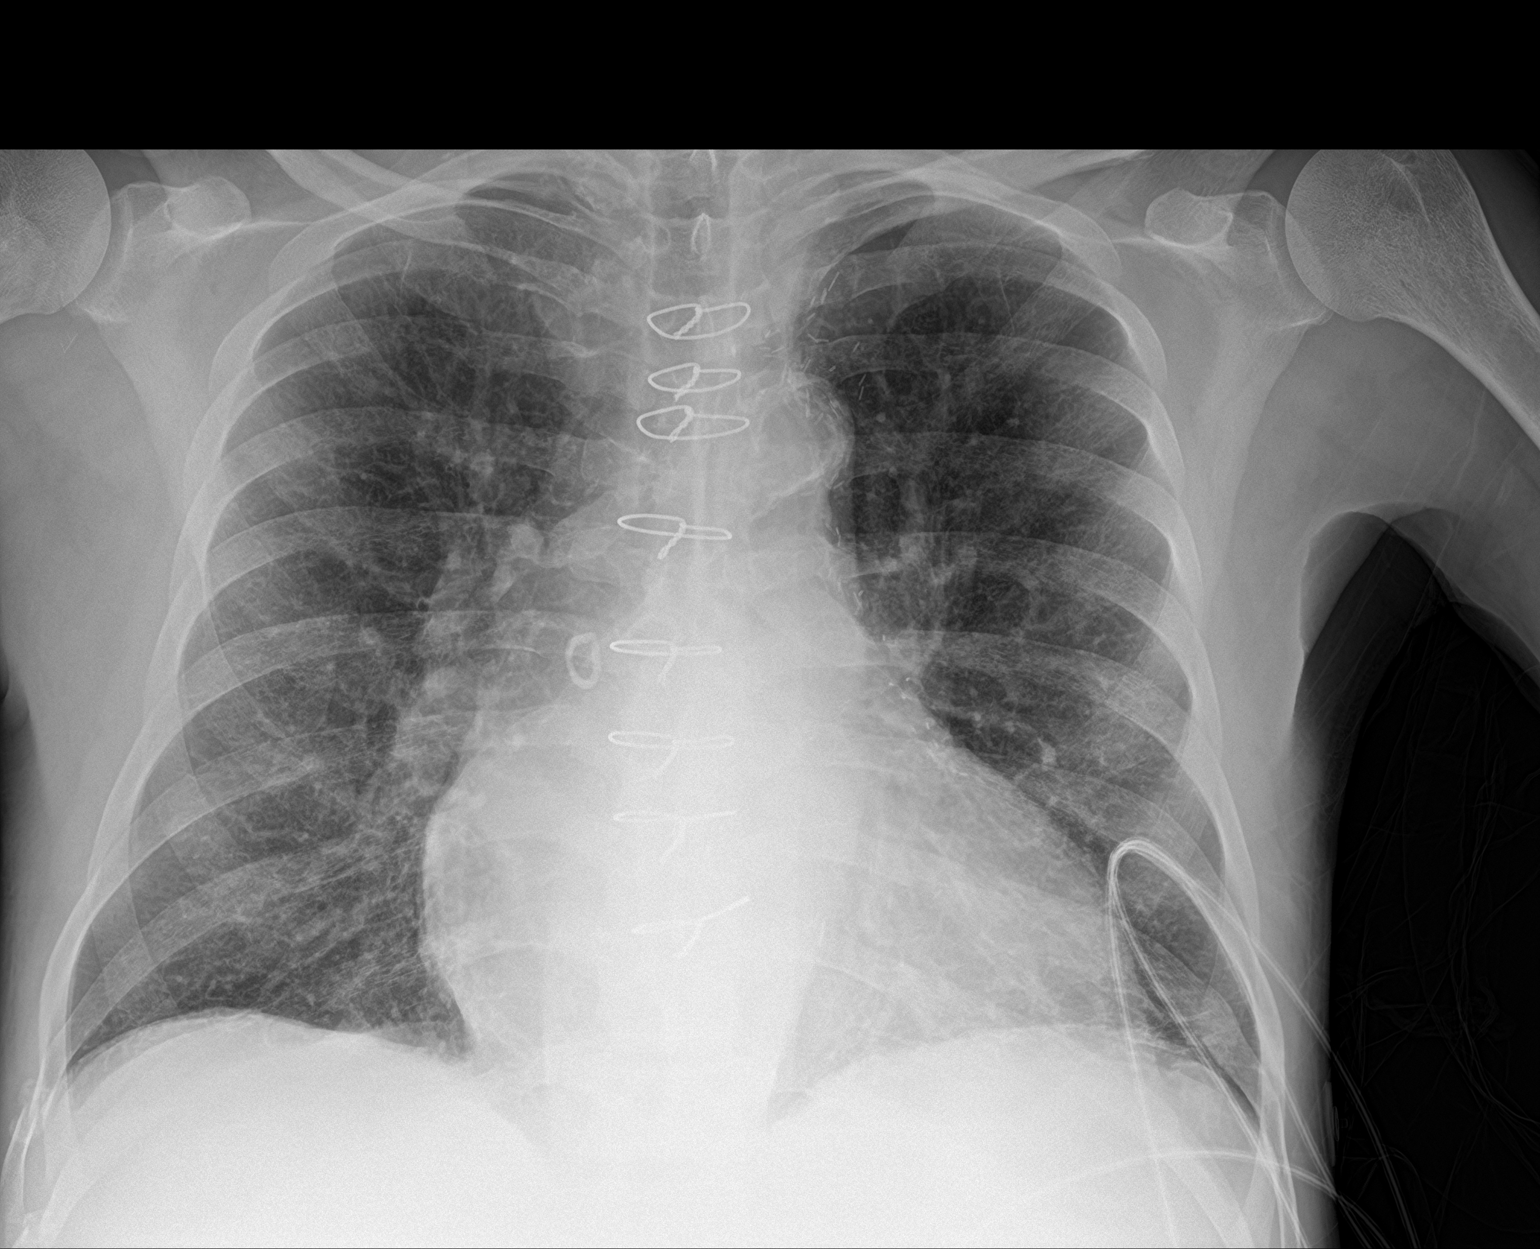

[2 of 2 positions shown; findings below may reference images not displayed]

FINDINGS: Mild hyperinflation. Median sternotomy for CABG. Midline trachea.
Moderate cardiomegaly. Atherosclerosis in the transverse aorta. No
pleural effusion or pneumothorax. Pulmonary interstitial prominence
persists.
IMPRESSION: Cardiomegaly with similar mild pulmonary venous congestion.

Aortic Atherosclerosis ([id number]).

## 2019-06-30 IMAGING — DX DG CHEST 1V
1 series · 1 of 1 positions shown · non-contrast
Comparison: November 15, 2016.

CLINICAL DATA: Shortness of breath.

EXAM:
CHEST 1 VIEW

[chest ap]
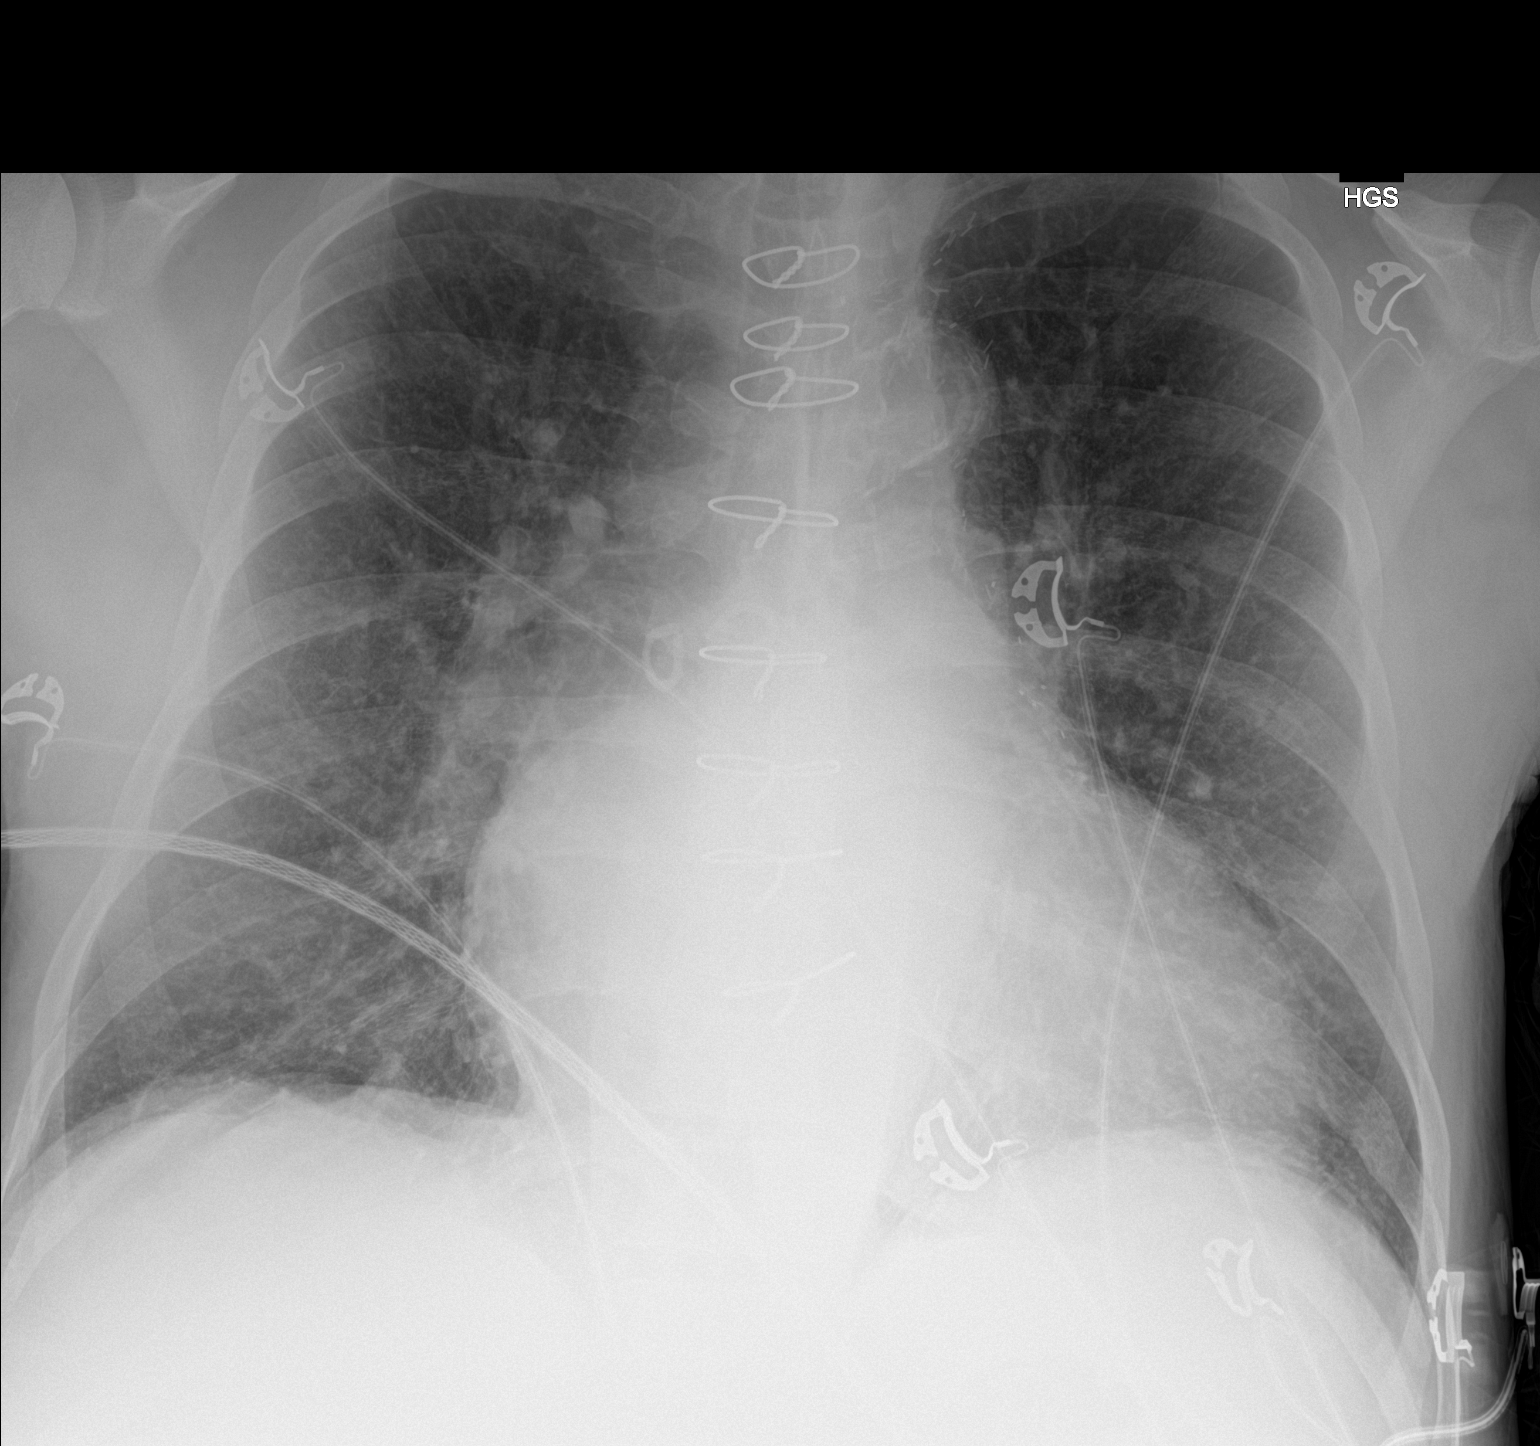

[1 of 1 positions shown; findings below may reference images not displayed]

FINDINGS: Unchanged cardiomegaly. Prior CABG. Mild pulmonary vascular
congestion. Atherosclerotic calcification of the aortic arch.
Bibasilar atelectasis. No focal consolidation, pleural effusion, or
pneumothorax. No acute osseous abnormality.
IMPRESSION: Cardiomegaly with mild pulmonary vascular congestion, unchanged.

## 2019-07-04 IMAGING — US US PARACENTESIS
1 series · 1 of 1 positions shown · non-contrast
Comparison: none

CLINICAL DATA: Renal insufficiency, cirrhosis, recurrent large
volume abdominal ascites.

EXAM:
ULTRASOUND GUIDED PARACENTESIS
TECHNIQUE: The procedure, risks (including but not limited to bleeding,
infection, organ damage ), benefits, and alternatives were explained
to the patient. Questions regarding the procedure were encouraged
and answered. The patient understands and consents to the procedure.
Survey ultrasound of the abdomen was performed and an appropriate
skin entry site in the right lateral abdomen was selected. Skin site
was marked, prepped with chlorhexadine, draped in usual sterile
fashion, and infiltrated locally with 1% lidocaine. A
Safe-T-Centesis needle was advanced into the peritoneal space until
fluid could be aspirated. The sheath was advanced and the needle
removed. 7.5 L of amber colored ascites were aspirated.
The patient tolerated the procedure well.
COMPLICATIONS:
COMPLICATIONS
none

[Series 1: us paracentesis · 0.26mm/px · 1 of 1 slices shown]
[im 1/1]
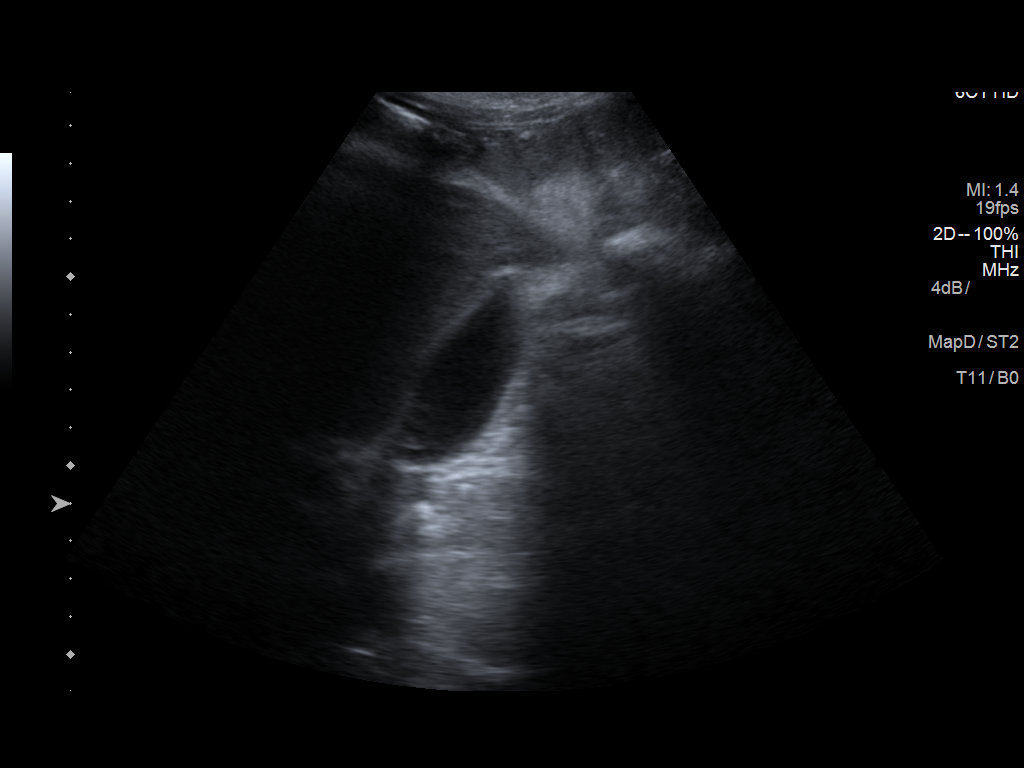

[1 of 1 positions shown; findings below may reference images not displayed]

IMPRESSION: Technically successful ultrasound guided paracentesis, removing
L ascites.

## 2019-07-16 IMAGING — US US PARACENTESIS
1 series · 7 of 7 positions shown · non-contrast
Comparison: none

INDICATION: Recurrent ascites.

[Series 1: us paracentesis · 0.28mm/px · 7 of 7 slices shown]
[im 1/7]
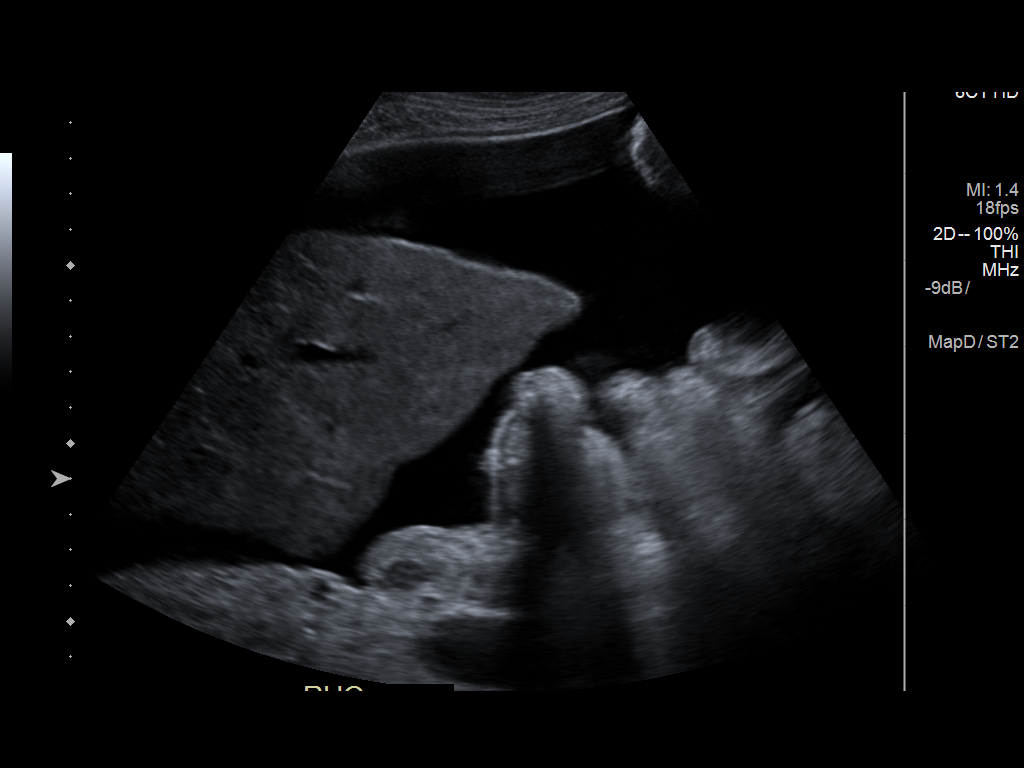
[im 2/7]
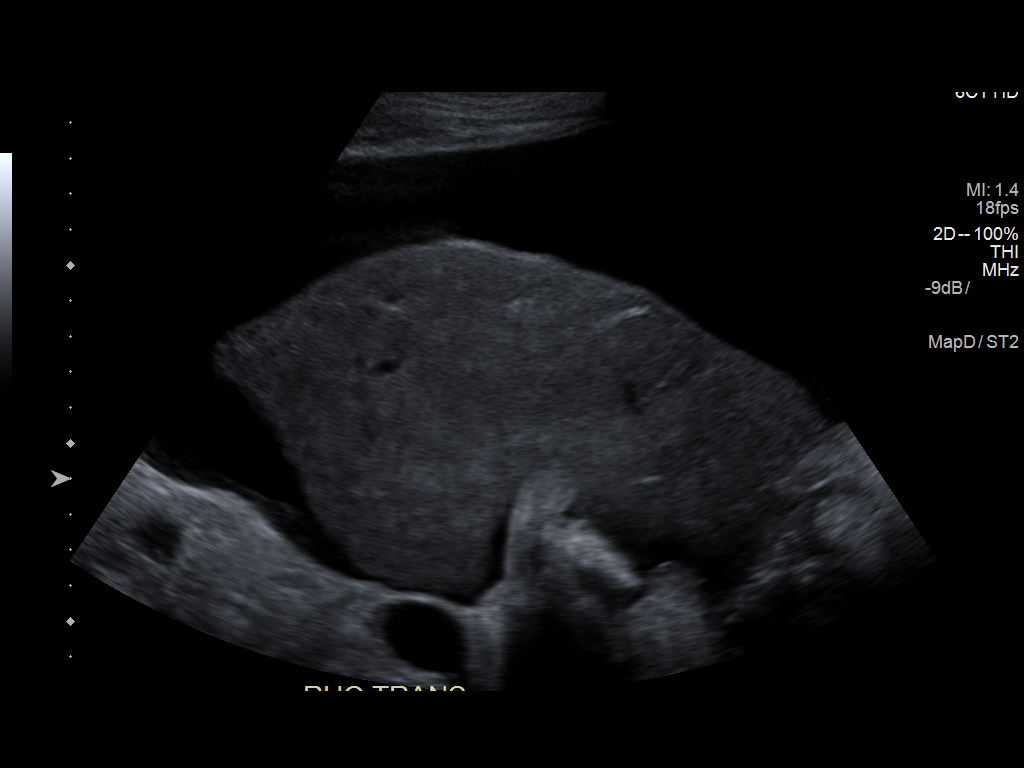
[im 3/7]
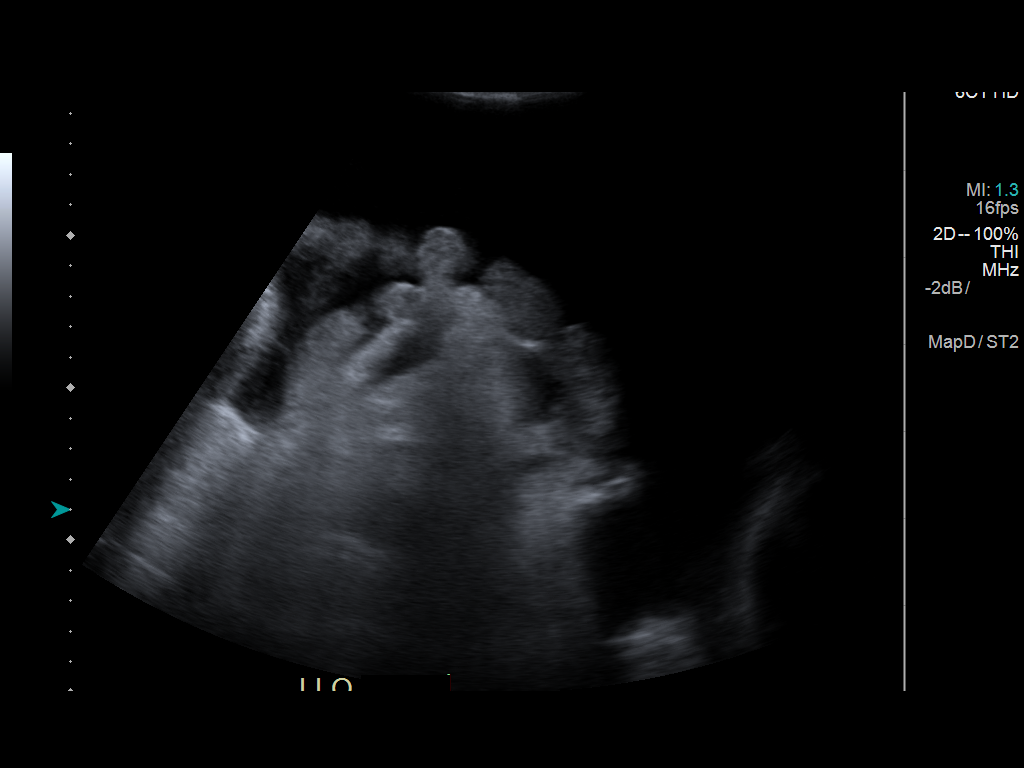
[im 4/7]
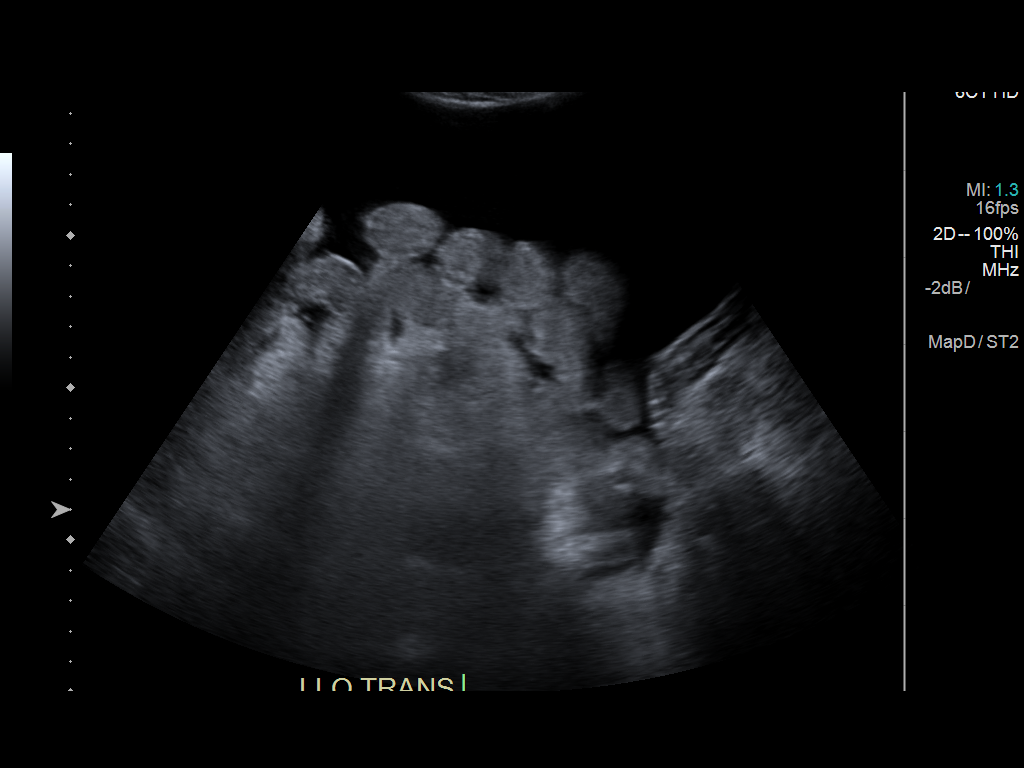
[im 5/7]
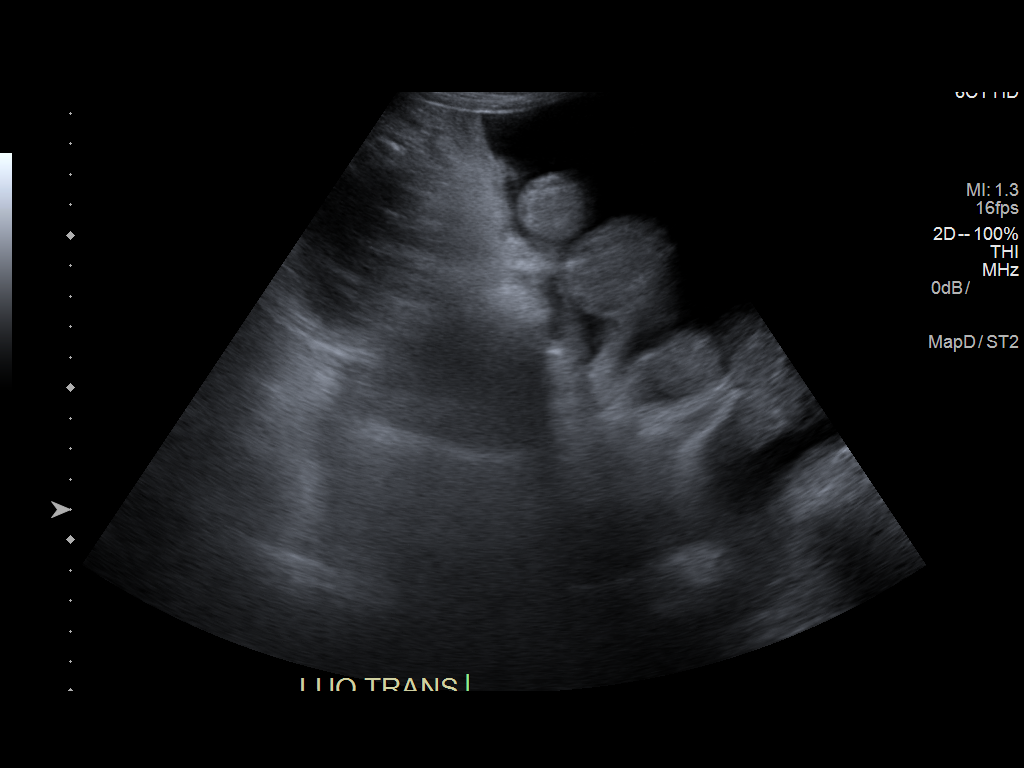
[im 6/7]
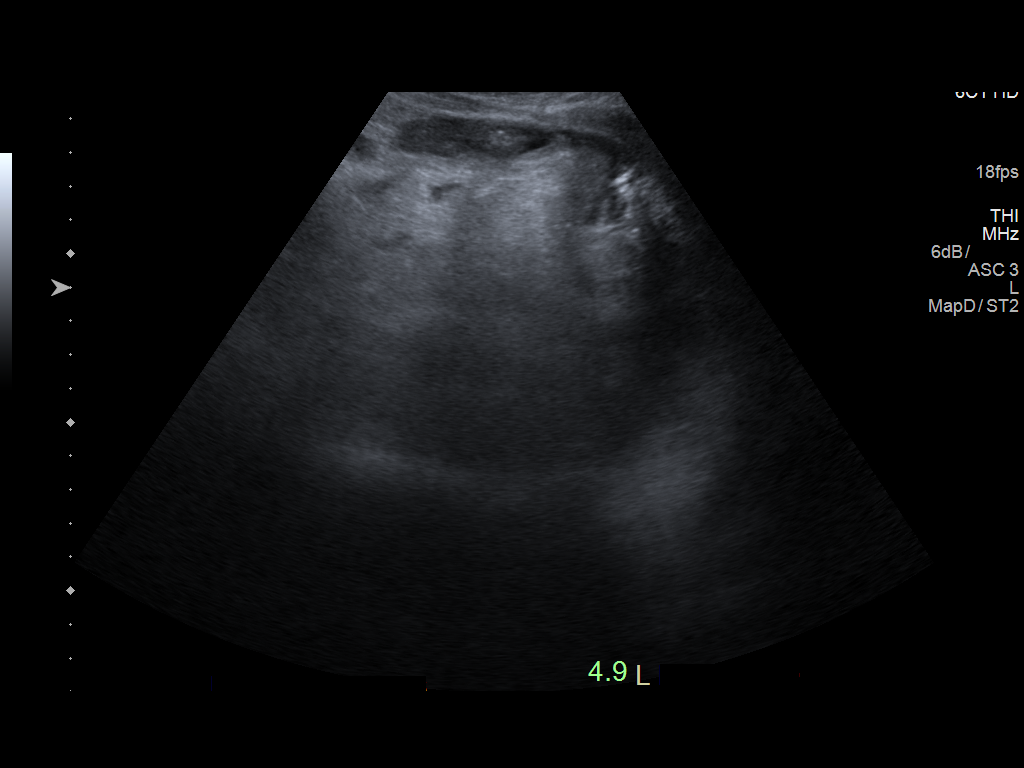
[im 7/7]
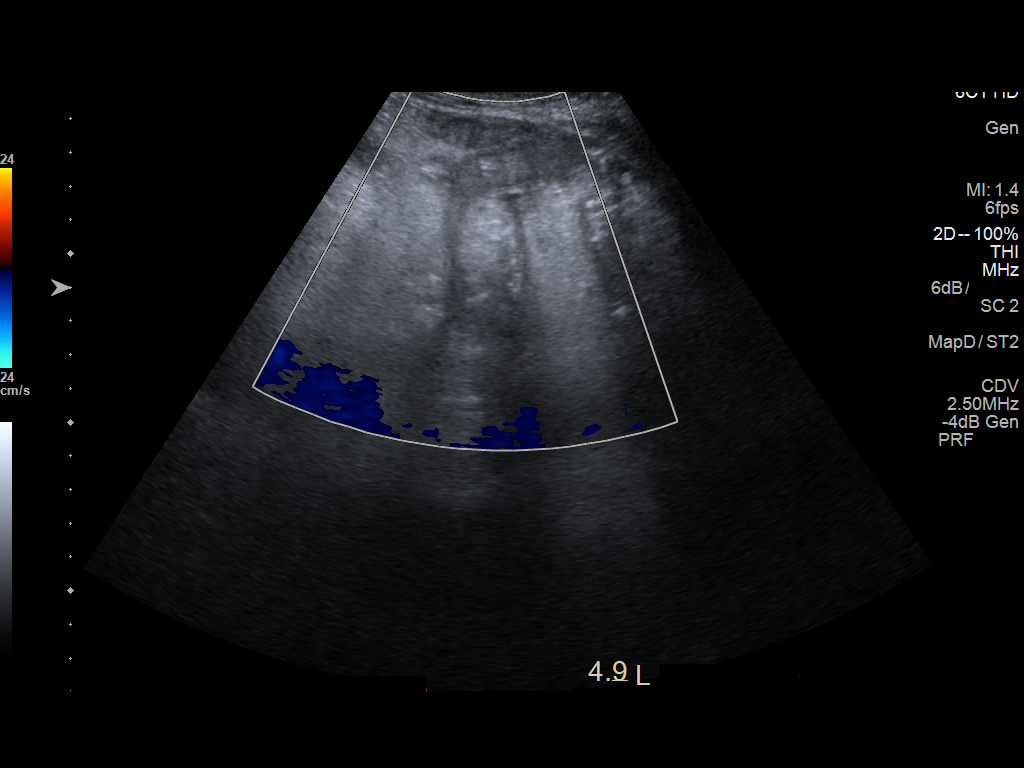

[7 of 7 positions shown; findings below may reference images not displayed]

EXAM:
ULTRASOUND GUIDED PARACENTESIS

MEDICATIONS:
None.

COMPLICATIONS:
None immediate.

PROCEDURE:
Informed written consent was obtained from the patient after a
discussion of the risks, benefits and alternatives to treatment. A
timeout was performed prior to the initiation of the procedure.

Initial ultrasound scanning demonstrates a large amount of ascites
within the left lower abdominal quadrant. The left lower abdomen was
prepped and draped in the usual sterile fashion. 1% lidocaine was
used for local anesthesia.

Following this, a 6 Fr Safe-T-Centesis catheter was introduced. An
ultrasound image was saved for documentation purposes. The
paracentesis was performed. The catheter was removed and a dressing
was applied. The patient tolerated the procedure well without
immediate post procedural complication.
FINDINGS: A total of approximately 4.9 L of yellow fluid was removed.
IMPRESSION: Successful ultrasound-guided paracentesis yielding 4.9 liters of
peritoneal fluid.

## 2019-07-24 IMAGING — CR DG CHEST 2V
2 series · 2 of 2 positions shown · non-contrast
Comparison: Chest x-ray of November 26, 2016

CLINICAL DATA: Shortness of breath ; history of coronary artery
disease, diabetes, cirrhosis, COPD, current smoker.

EXAM:
CHEST  2 VIEW

[chest pa]
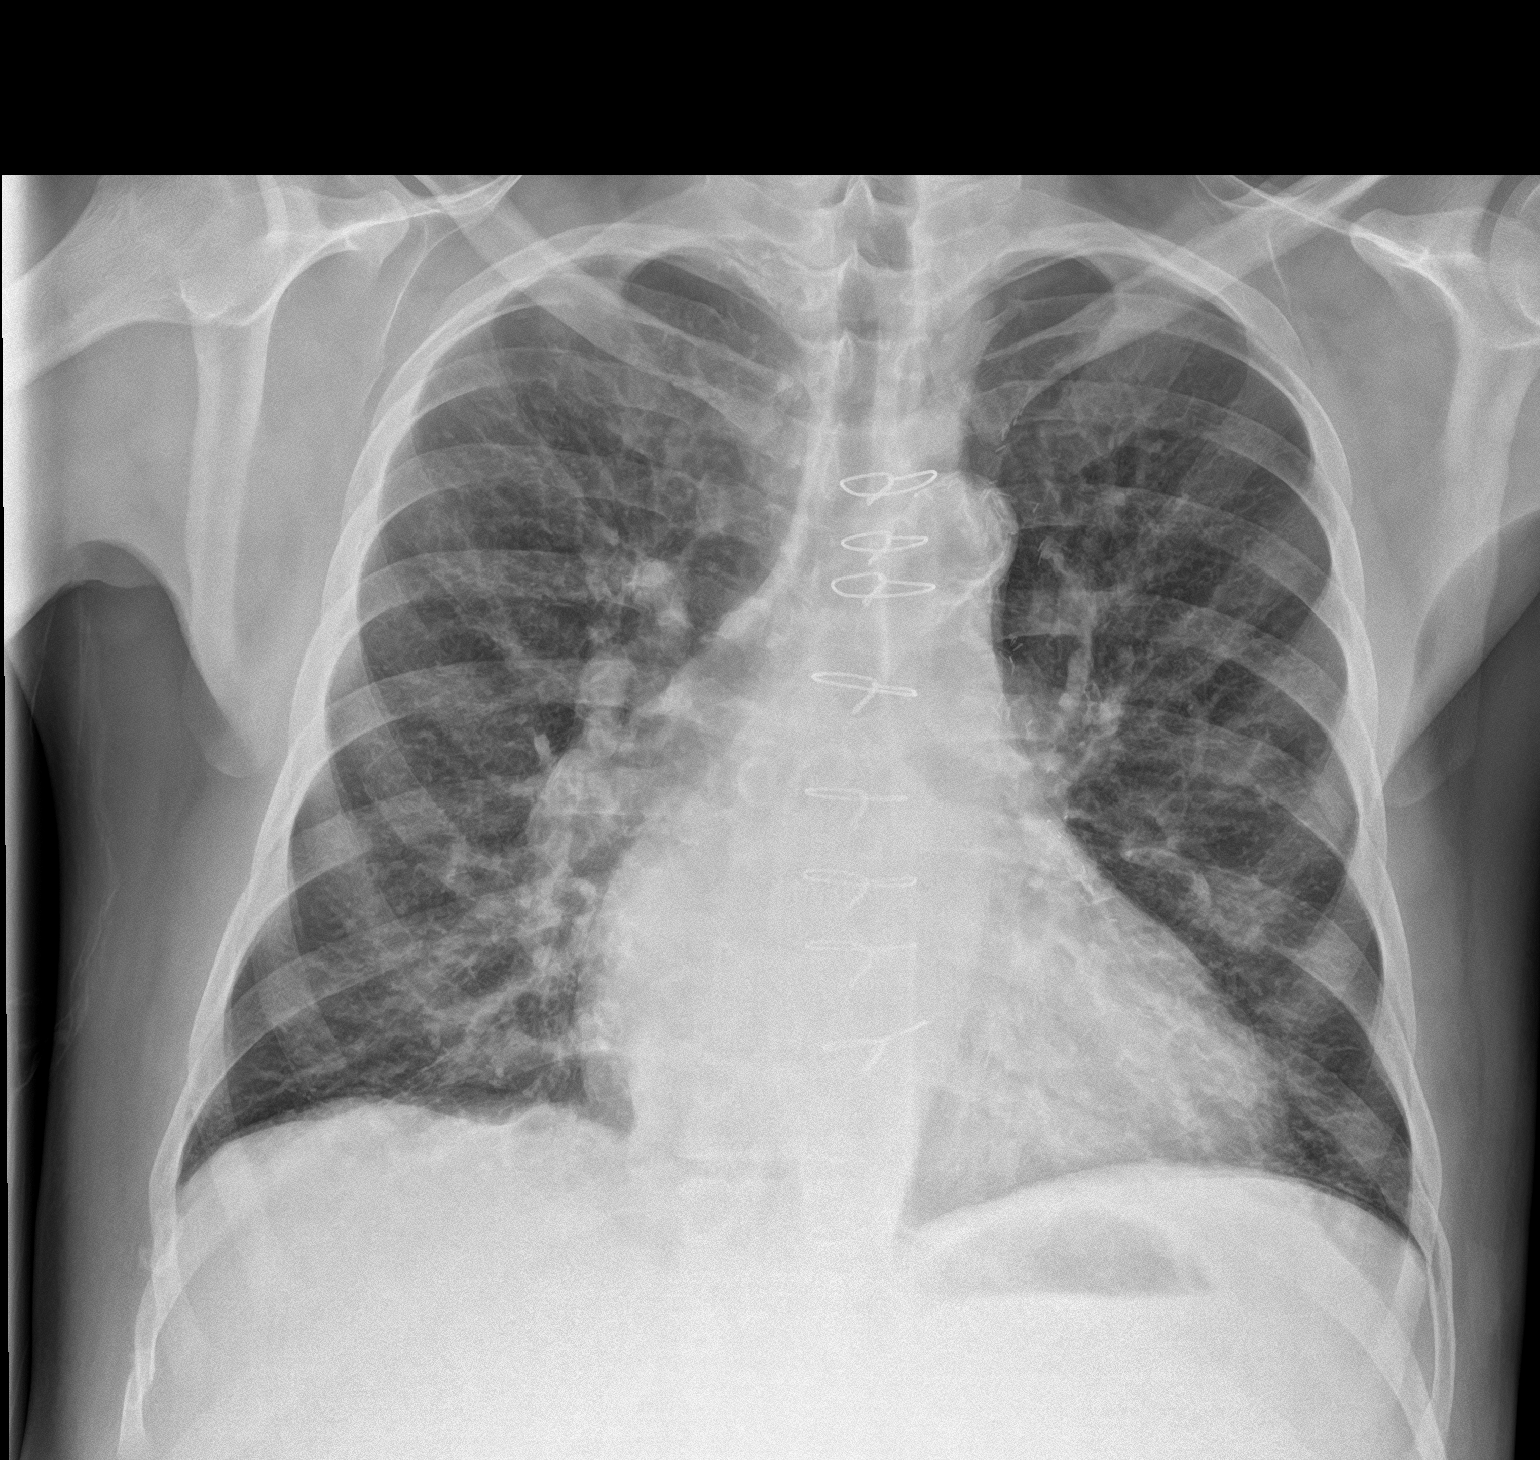

[chest lat]
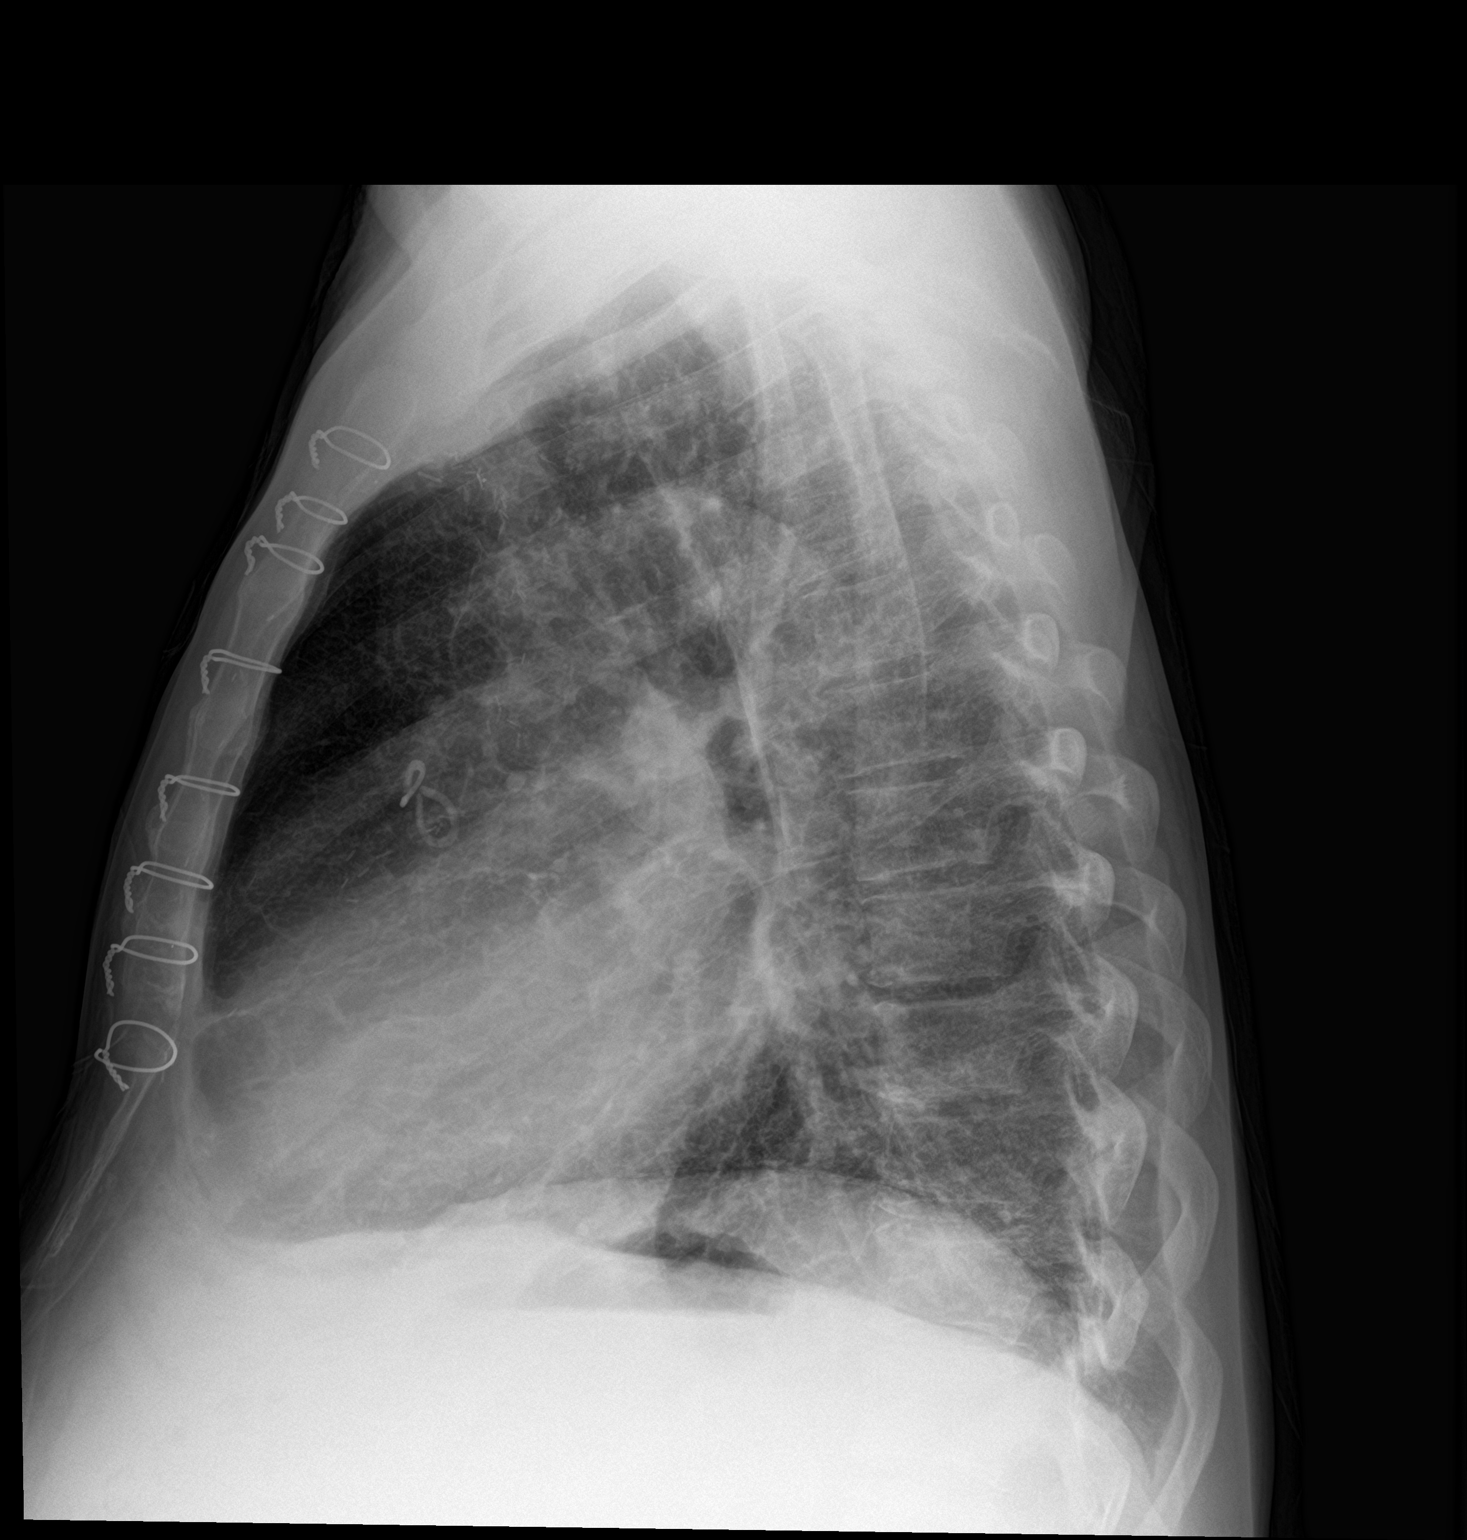

[2 of 2 positions shown; findings below may reference images not displayed]

FINDINGS: The lungs are well-expanded. The interstitial markings are increased
and more conspicuous than on the previous study. The cardiac
silhouette remains enlarged. The central pulmonary vascularity is
prominent. There are post CABG changes. There is calcification in
the wall of the thoracic aorta. There is no pleural effusion. The
bony thorax exhibits no acute abnormality.
IMPRESSION: COPD with superimposed mild CHF. No alveolar pneumonia. Previous
CABG.

Thoracic aortic atherosclerosis.

## 2019-08-08 IMAGING — US US PARACENTESIS
1 series · 2 of 2 positions shown · non-contrast
Comparison: none

INDICATION: Recurrent ascites.

[Series 1: us paracentesis · 0.26mm/px · 2 of 2 slices shown]
[im 1/2]
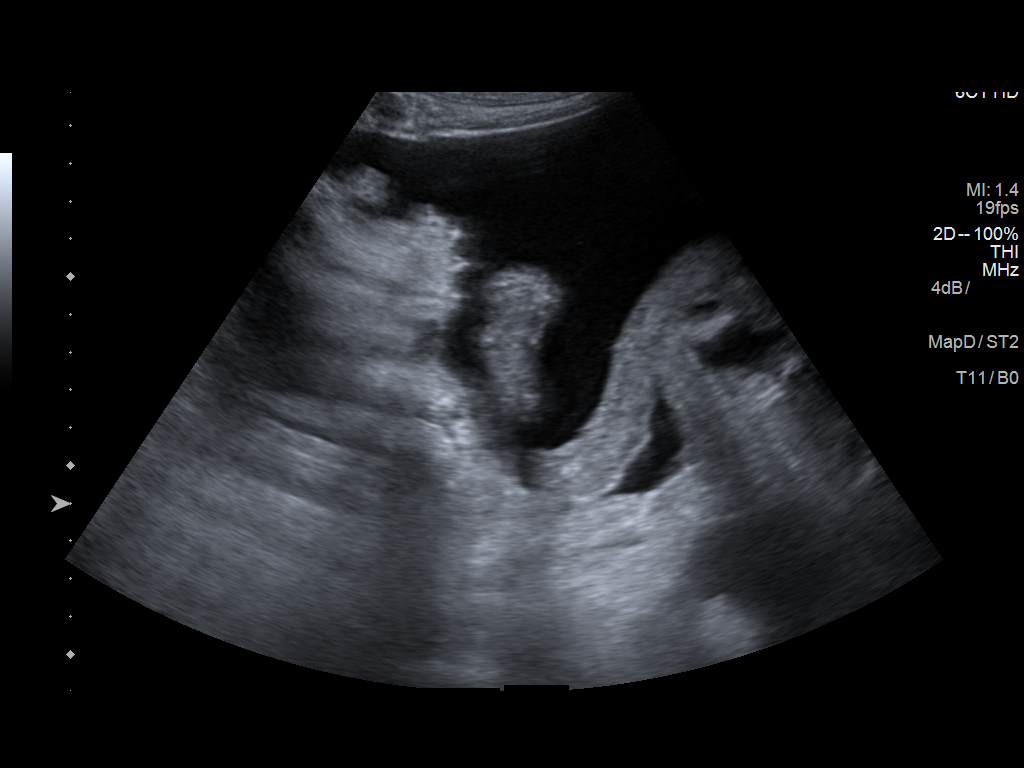
[im 2/2]
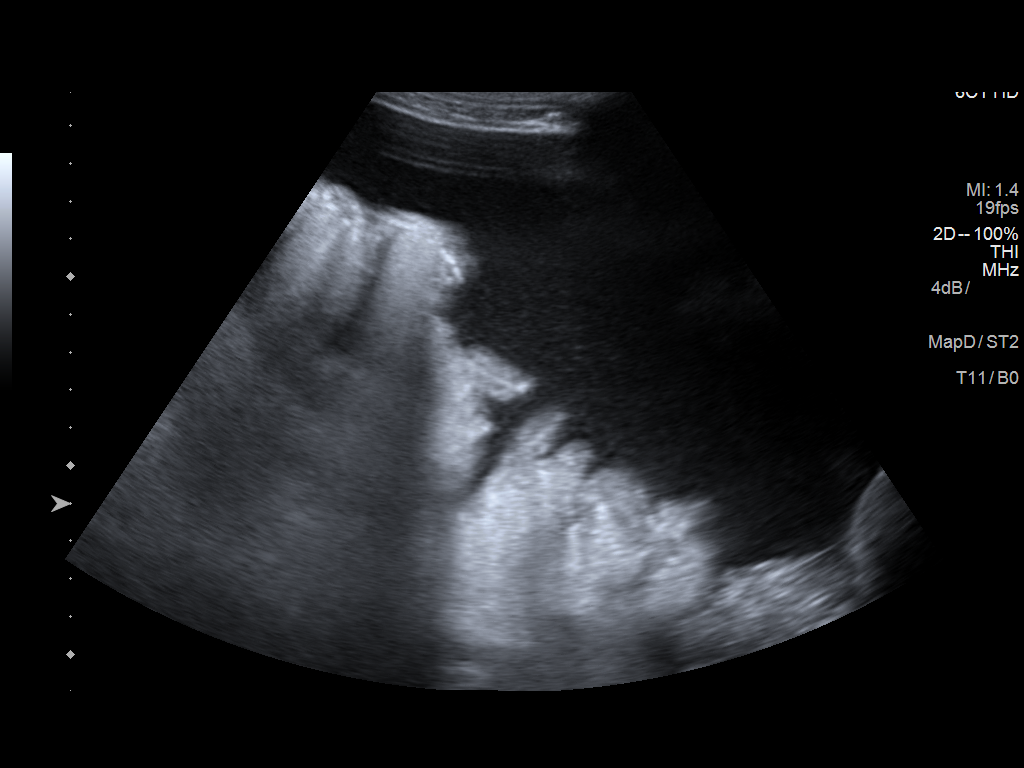

[2 of 2 positions shown; findings below may reference images not displayed]

EXAM:
ULTRASOUND GUIDED PARACENTESIS

MEDICATIONS:
None.

COMPLICATIONS:
None immediate.

PROCEDURE:
Informed written consent was obtained from the patient after a
discussion of the risks, benefits and alternatives to treatment. A
timeout was performed prior to the initiation of the procedure.

Initial ultrasound scanning demonstrates a large amount of ascites
within the left lower abdominal quadrant. The left lower abdomen was
prepped and draped in the usual sterile fashion. 1% lidocaine was
used for local anesthesia.

Following this, a 6 Fr Safe-T-Centesis catheter was introduced. An
ultrasound image was saved for documentation purposes. The
paracentesis was performed. The catheter was removed and a dressing
was applied. The patient tolerated the procedure well without
immediate post procedural complication.
FINDINGS: A total of approximately 4.95 L of yellow fluid was removed.
IMPRESSION: Successful ultrasound-guided paracentesis yielding 4.95 liters of
peritoneal fluid.

## 2019-08-11 IMAGING — DX DG CHEST 1V PORT
1 series · 1 of 1 positions shown · non-contrast
Comparison: 12/20/2016

CLINICAL DATA: Shortness of breath.  Dialysis patient.

EXAM:
PORTABLE CHEST 1 VIEW

[chest ap]
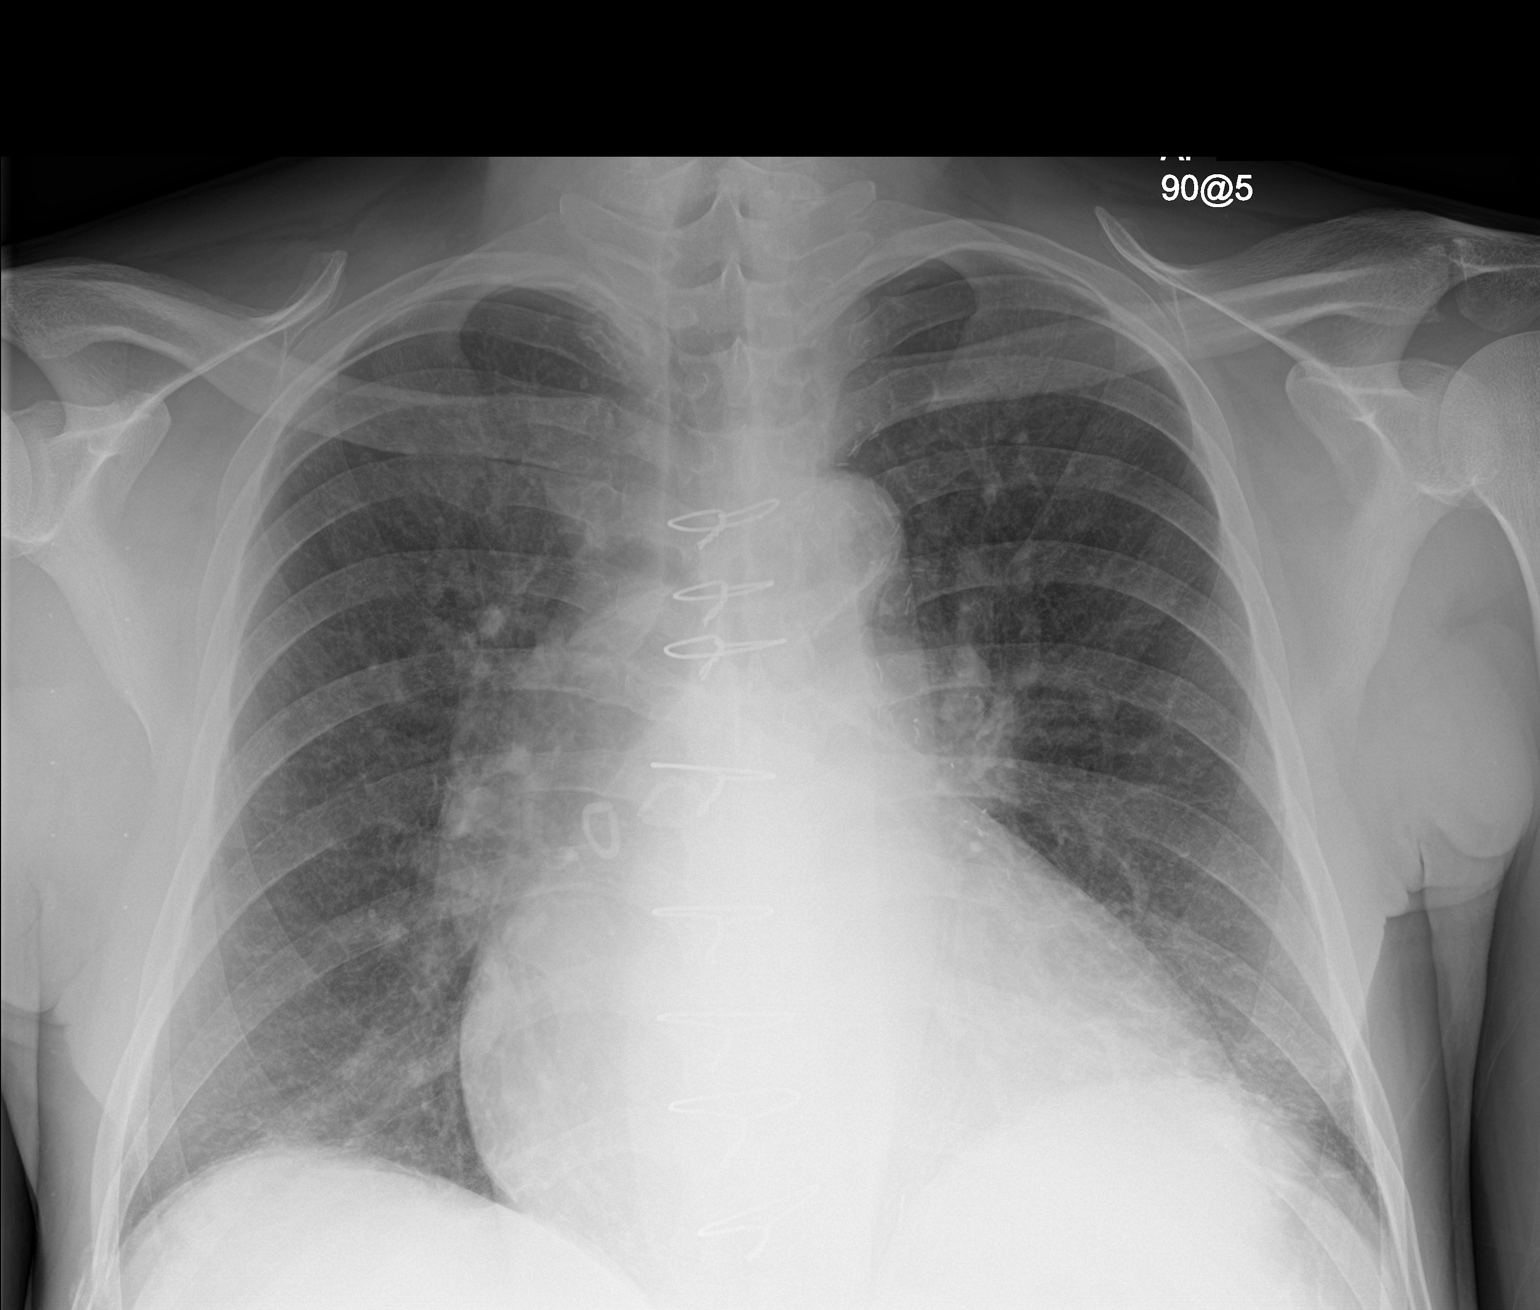

[1 of 1 positions shown; findings below may reference images not displayed]

FINDINGS: Mild fullness in the central vascular structures without overt
pulmonary edema. Heart size is upper limits of normal and stable.
Prior median sternotomy. No large pleural effusions. Subtle lucency
underneath the right hemidiaphragm.
IMPRESSION: Mild vascular congestion without overt pulmonary edema.

Subtle lucency underneath the right hemidiaphragm is nonspecific.
Difficult to exclude a small amount of intraperitoneal air.
Recommend dedicated abdominal images with a decubitus view for
further characterization.

## 2019-08-15 IMAGING — DX DG ABDOMEN 1V
1 series · 1 of 1 positions shown · non-contrast
Comparison: 05/16/2015

CLINICAL DATA: Abdominal pain

EXAM:
ABDOMEN - 1 VIEW

[abdomen kub]
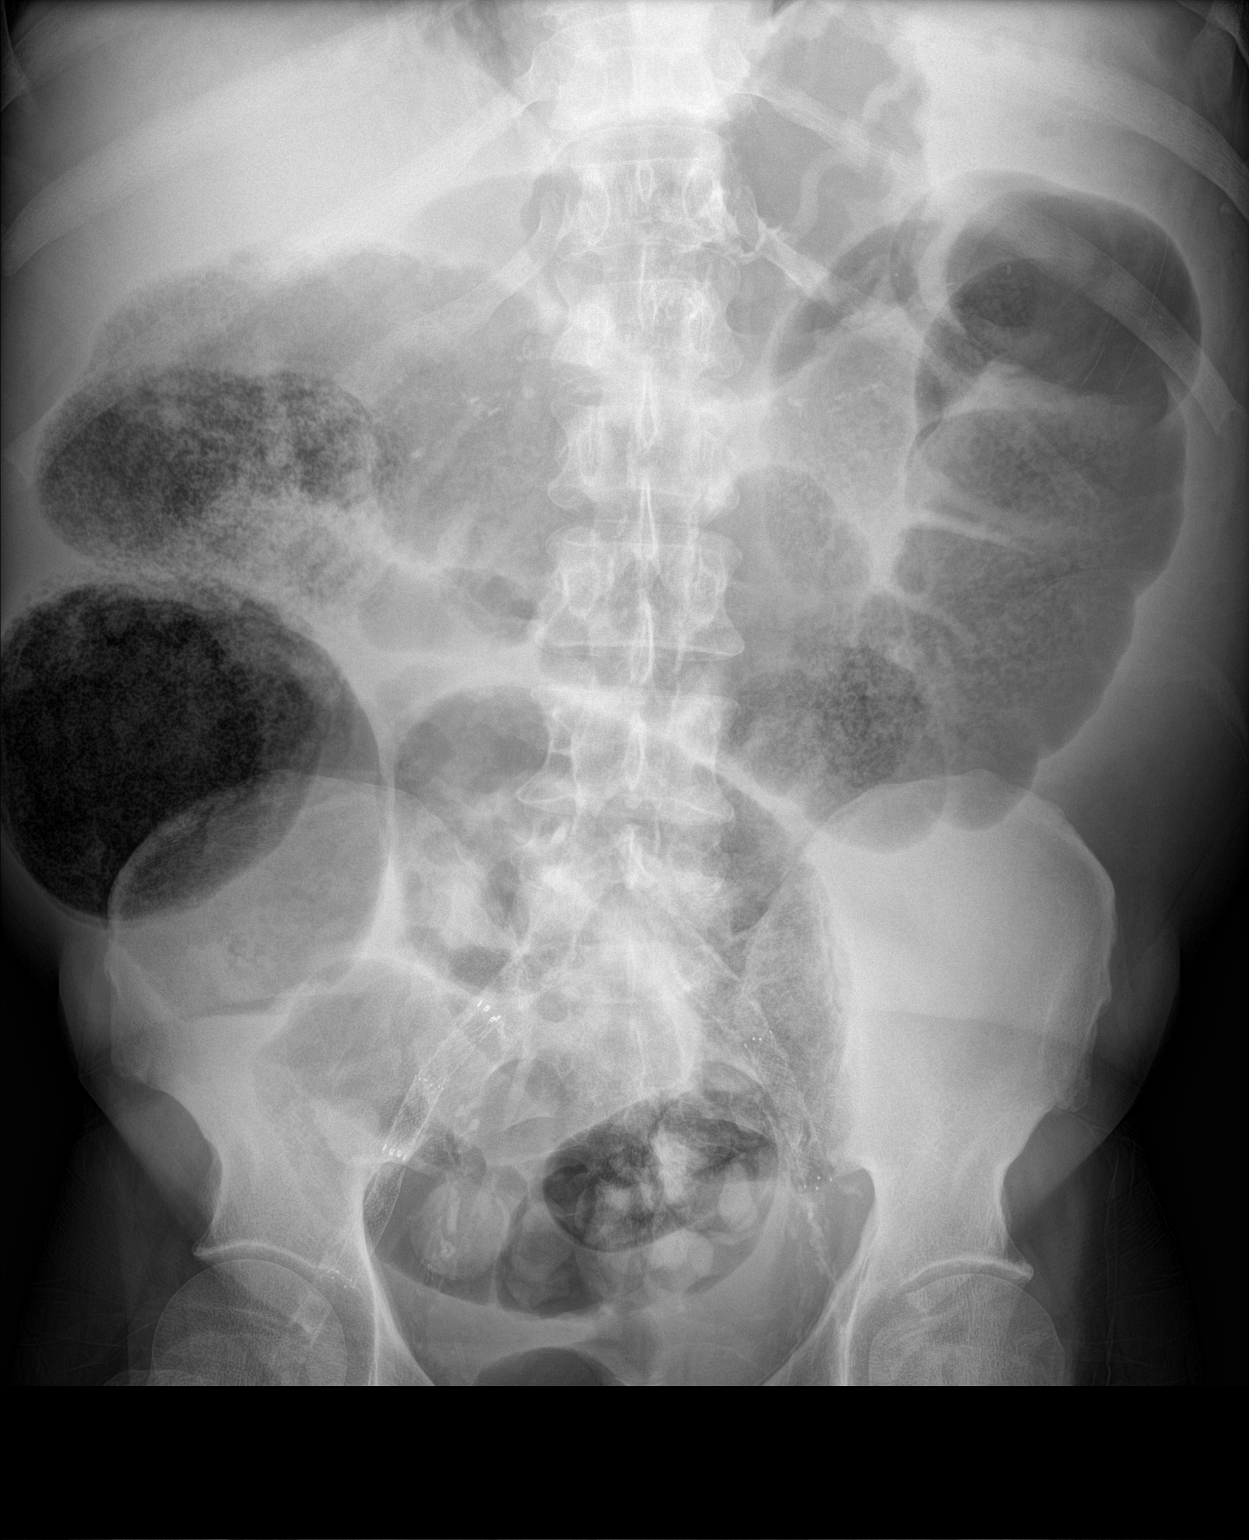

[1 of 1 positions shown; findings below may reference images not displayed]

FINDINGS: Diffuse gaseous distention of colon with relative rectosigmoid
sparing. The rectosigmoid segments do contain gas, arguing against a
low obstruction. Stool is seen multiple segments, no rectal
impaction. Accounting for stool there is no suspected pneumatosis.
No concerning mass effect or gas collection. Advanced
atherosclerosis. Bi-iliac stenting.
IMPRESSION: Diffuse gaseous distension of colon, question colonic ileus or
lactulose side effect. Diffuse formed stool without impaction.

## 2019-08-16 IMAGING — CT CT ABD-PELV W/ CM
2 of 5 series · 15 of 46 positions shown, 17 images · IV contrast (APPLIED)
Comparison: None.

CLINICAL DATA: 56-year-old male with history of end-stage renal
disease on hemodialysis, chronic iron deficiency anemia, liver
cirrhosis from EtOH chronic GI blood loss due to small bowel
angiectasia, polysubstance abuse and noncompliance who presents to
the ER with abdominal pain, melena and shortness of breath.Pt c/o
abdominal pain from severe abdominal distention from ascites and now
with severe constipation. States last BM was 8 days ago.

EXAM:
CT ABDOMEN AND PELVIS WITH CONTRAST
TECHNIQUE: Multidetector CT imaging of the abdomen and pelvis was performed
using the standard protocol following bolus administration of
intravenous contrast.
CONTRAST:  100mL [id number] IOPAMIDOL ([id number]) INJECTION 61%

[Series 2: routine abd/pel with · axial · 0.74mm/px · z∈[-739,-269]mm · 12 of 106 slices shown, 14 images]
[im 6/106  soft-tissue]
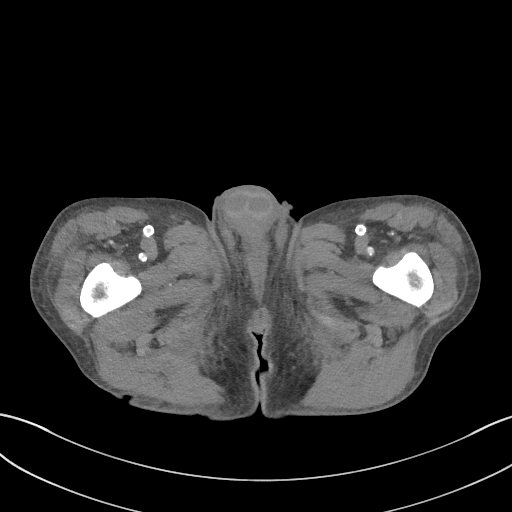
[im 6/106  bone]
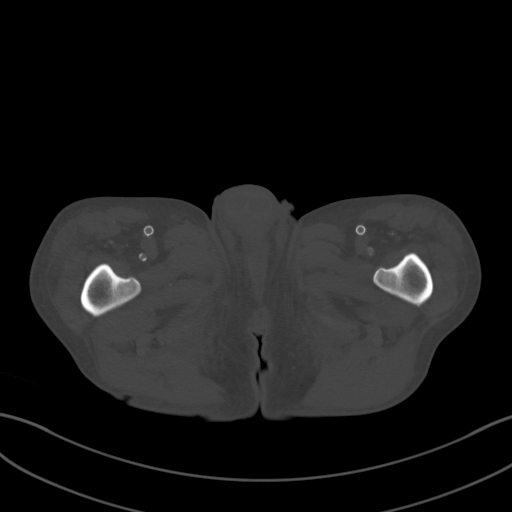
[im 17/106  soft-tissue]
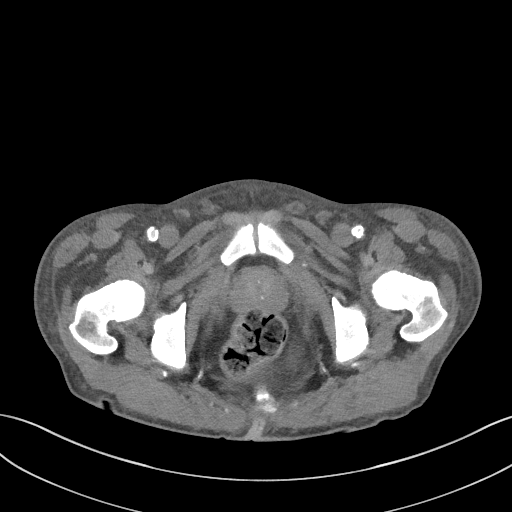
[im 23/106  soft-tissue]
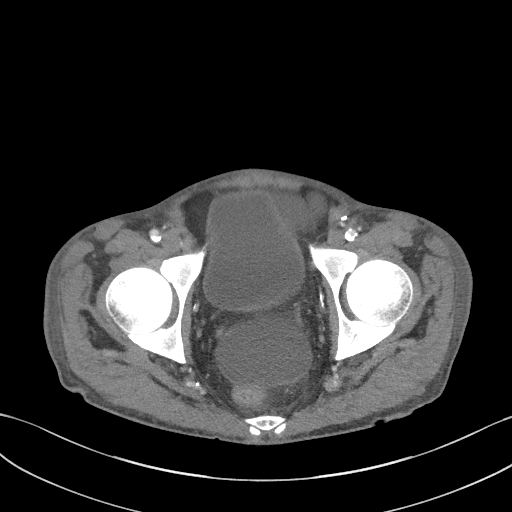
[im 34/106  soft-tissue]
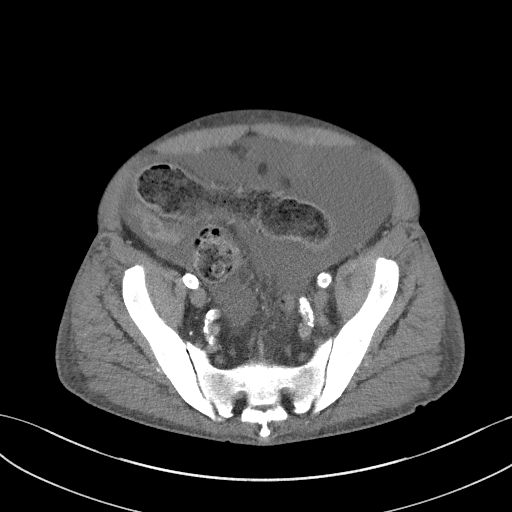
[im 39/106  soft-tissue]
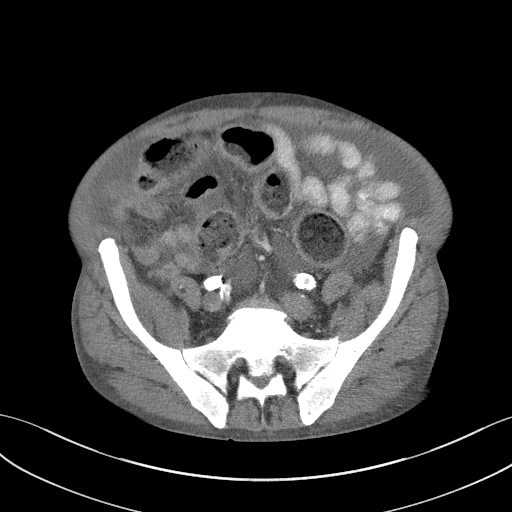
[im 50/106  soft-tissue]
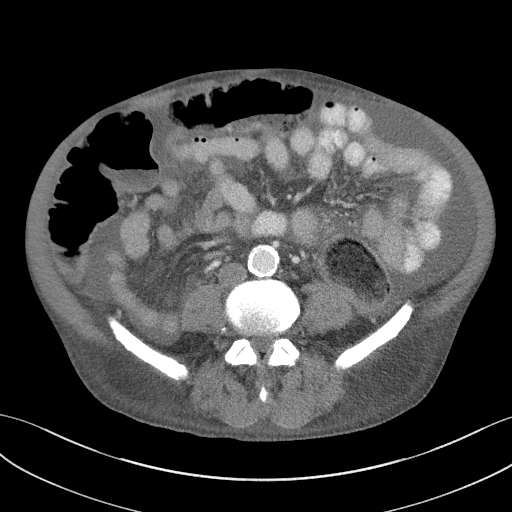
[im 56/106  soft-tissue]
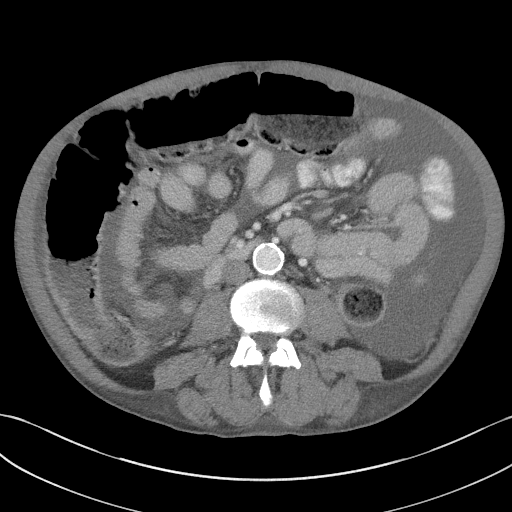
[im 67/106  soft-tissue]
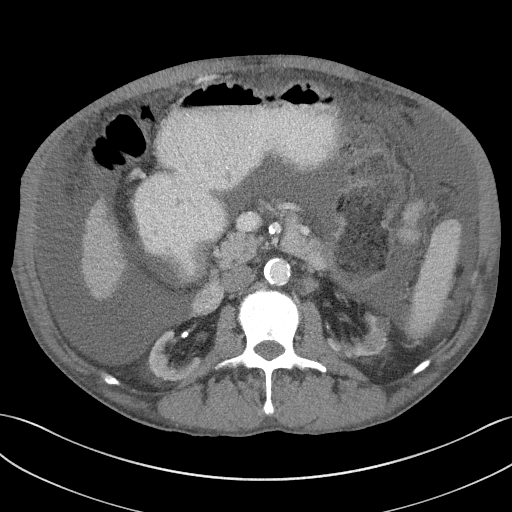
[im 72/106  soft-tissue]
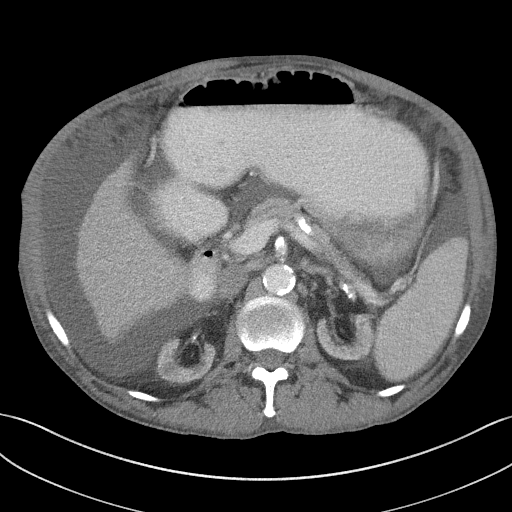
[im 72/106  bone]
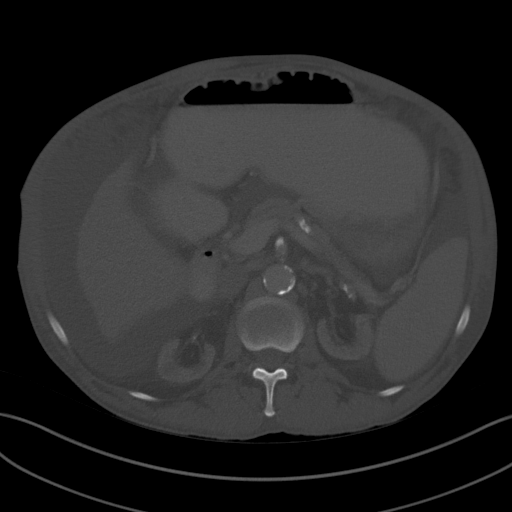
[im 83/106  soft-tissue]
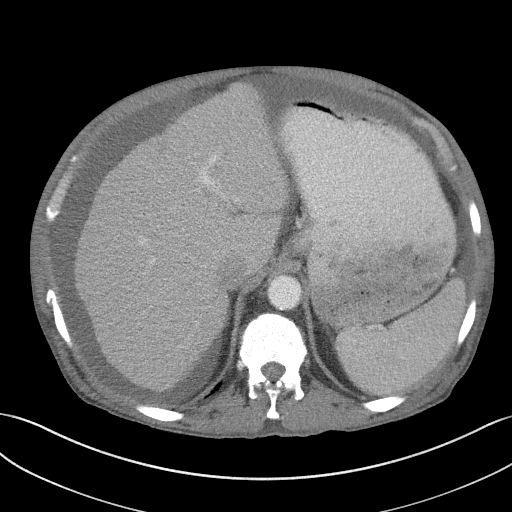
[im 89/106  soft-tissue]
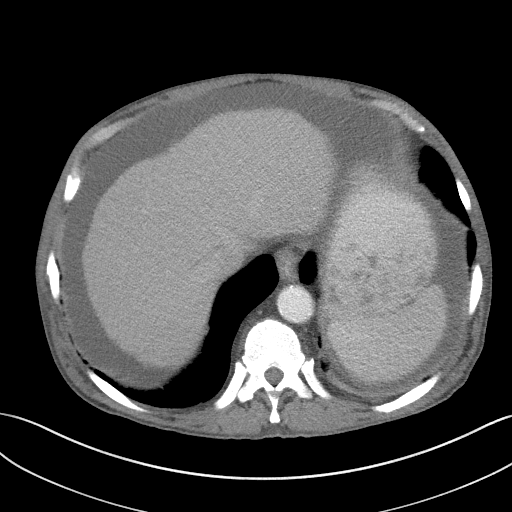
[im 100/106  soft-tissue]
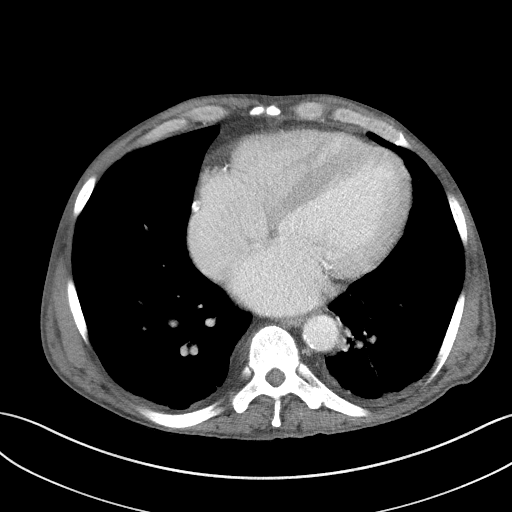

[Series 5: coronal st · coronal · 0.73mm/px · 3 of 98 slices shown]
[im 33/98  soft-tissue]
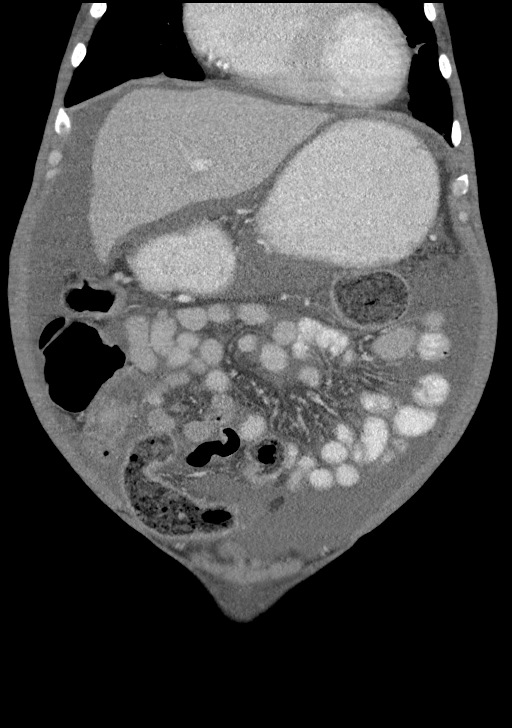
[im 44/98  soft-tissue]
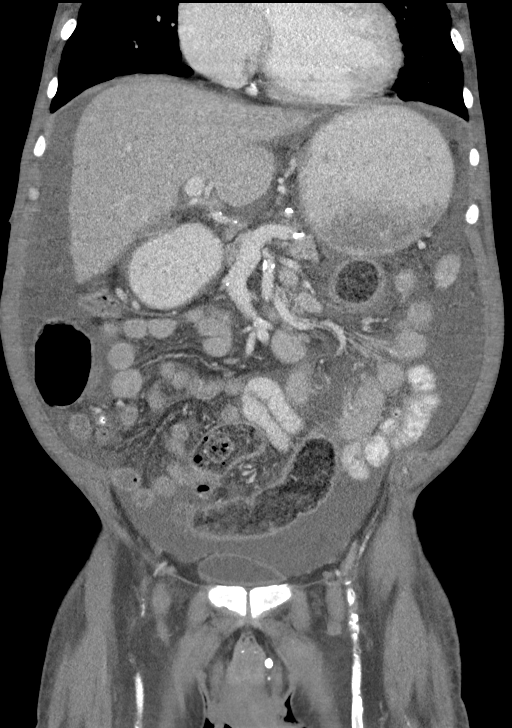
[im 54/98  soft-tissue]
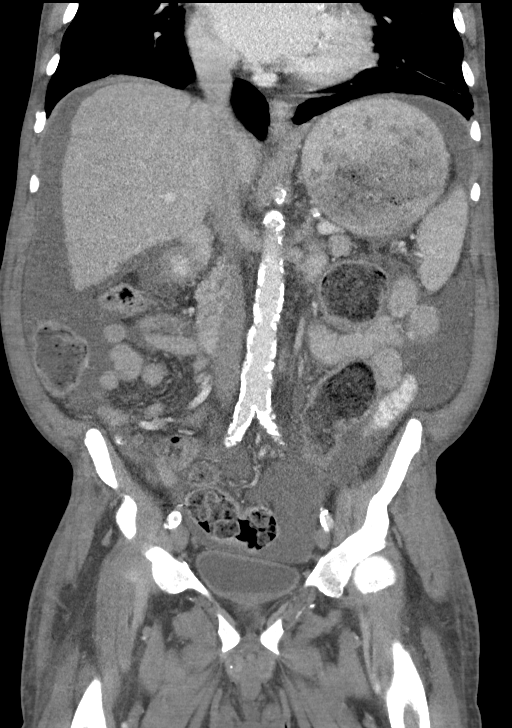

[15 of 46 positions shown; findings below may reference images not displayed]

FINDINGS: Lower chest: Heart moderately enlarged. Mild dependent subsegmental
atelectasis mostly on the left. No acute findings.

Hepatobiliary: Liver normal in size. Liver normal in size and
attenuation. No mass or focal lesion. Gallbladder remarkable. No
bile duct dilation.

Pancreas: Unremarkable. No pancreatic ductal dilatation or
surrounding inflammatory changes.

Spleen: Borderline enlarged, measuring 13.5 x 11.2 x 4.7 cm. No mass
or focal lesion.

Adrenals/Urinary Tract: No adrenal masses. Bilateral marked renal
atrophy with multiple low-density renal masses consistent with
cysts. No hydronephrosis. Ureters normal in course and in caliber.
Bladder is unremarkable.

Stomach/Bowel: Moderate distention of the stomach. No wall
thickening or adjacent inflammation. Normal small bowel. Mild
colonic distention with mild to moderate increase colonic stool. No
colonic wall thickening or adjacent inflammation. Normal appendix
visualized.

Vascular/Lymphatic: Diffuse aortic atherosclerotic calcifications
and ectasia. Greatest diameter is infrarenal, 2.5 cm. Significant
aortic branch vessel atherosclerotic calcifications. No
pathologically enlarged lymph nodes.

Reproductive: Mild enlargement of prostate gland.

Other: Moderate ascites distends the abdomen. No abdominal wall
hernia.

Musculoskeletal: No acute fracture or acute finding. No osteoblastic
or osteolytic lesions. Bold right lower lateral rib fractures.
IMPRESSION: 1. No acute abnormalities within the abdomen or pelvis.
2. Moderate ascites.
3. Marked renal atrophy and acquired renal cystic disease consistent
with the history of end-stage renal disease.
4. Although moderate increased stool throughout the colon with mild
colonic distention. No bowel obstruction or inflammation.
5. Aortic atherosclerosis.
6. Aortic ectasia. Ectatic abdominal aorta at risk for aneurysm
development. Recommend followup by ultrasound in 5 years. This
recommendation follows ACR consensus guidelines: White Paper of the
ACR Incidental Findings Committee II on Vascular Findings. [HOSPITAL] 0962; [DATE].

## 2019-08-16 IMAGING — DX DG ABDOMEN 1V
2 series · 2 of 2 positions shown · non-contrast
Comparison: 01/11/2017

CLINICAL DATA: Followup ileus.

EXAM:
ABDOMEN - 1 VIEW

[abdomen kub (1 of 2)]
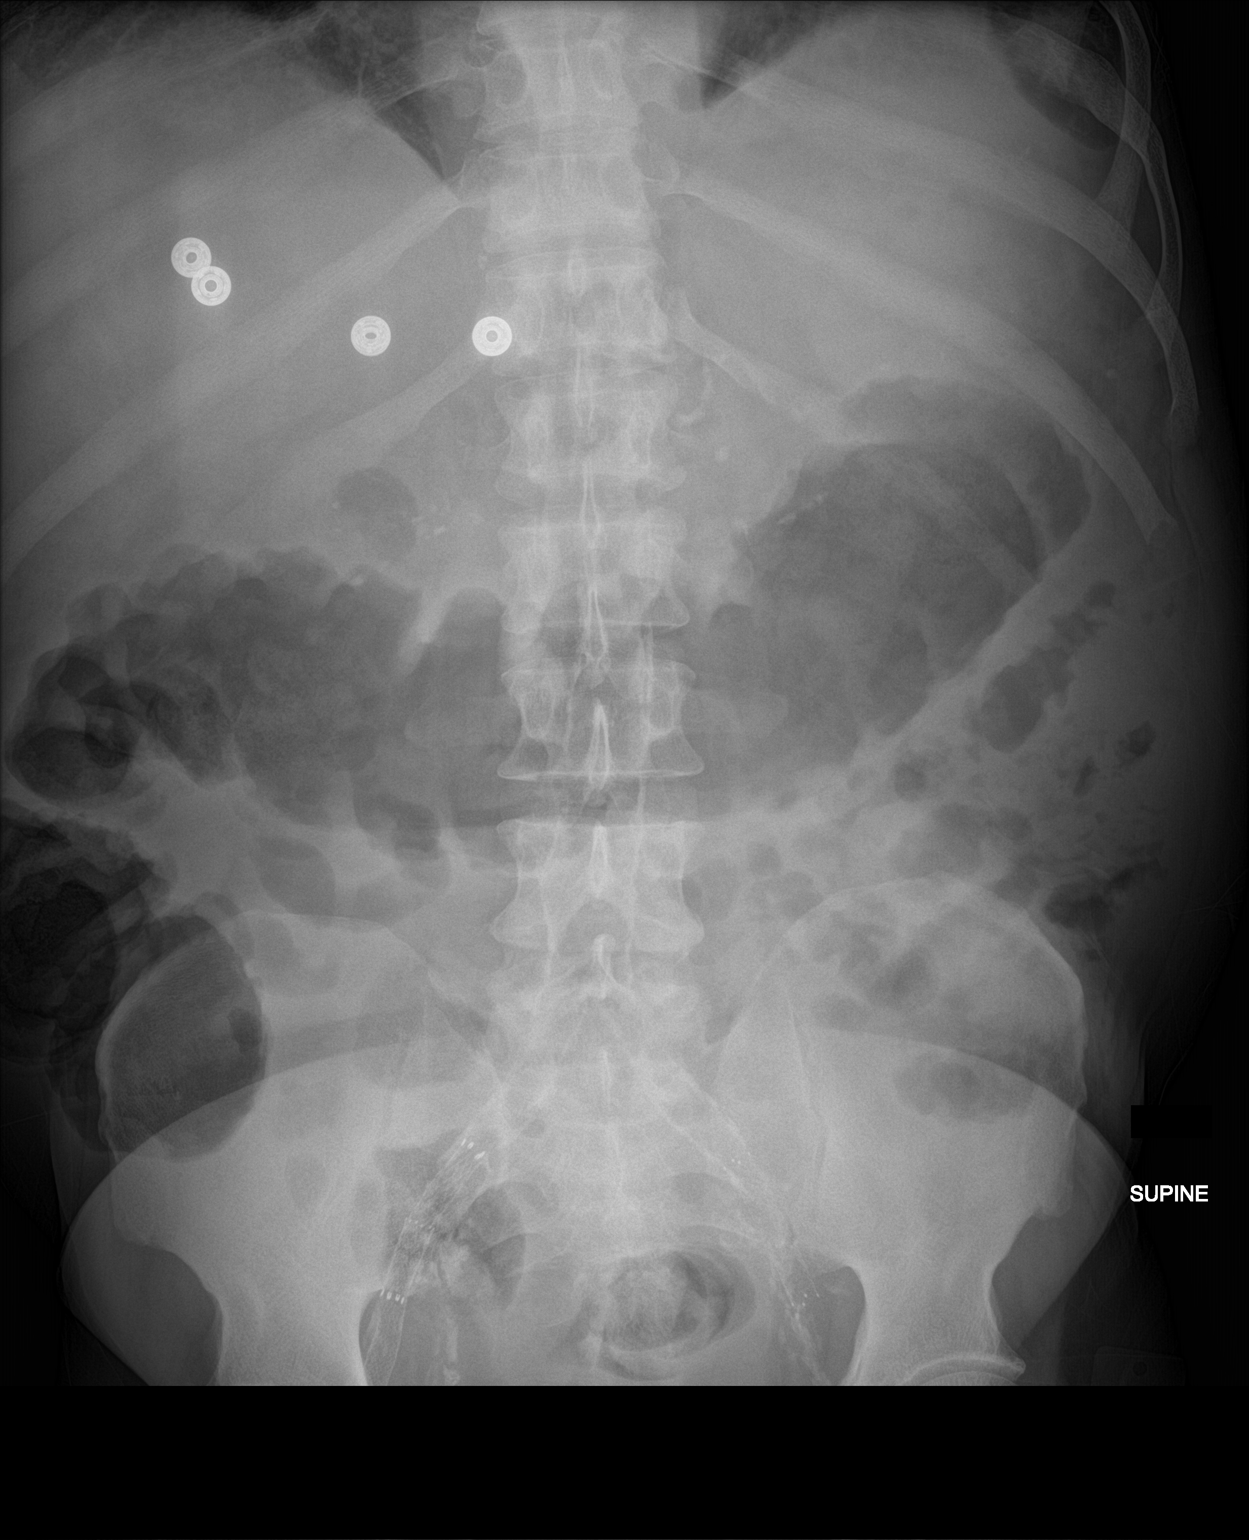

[abdomen kub (2 of 2)]
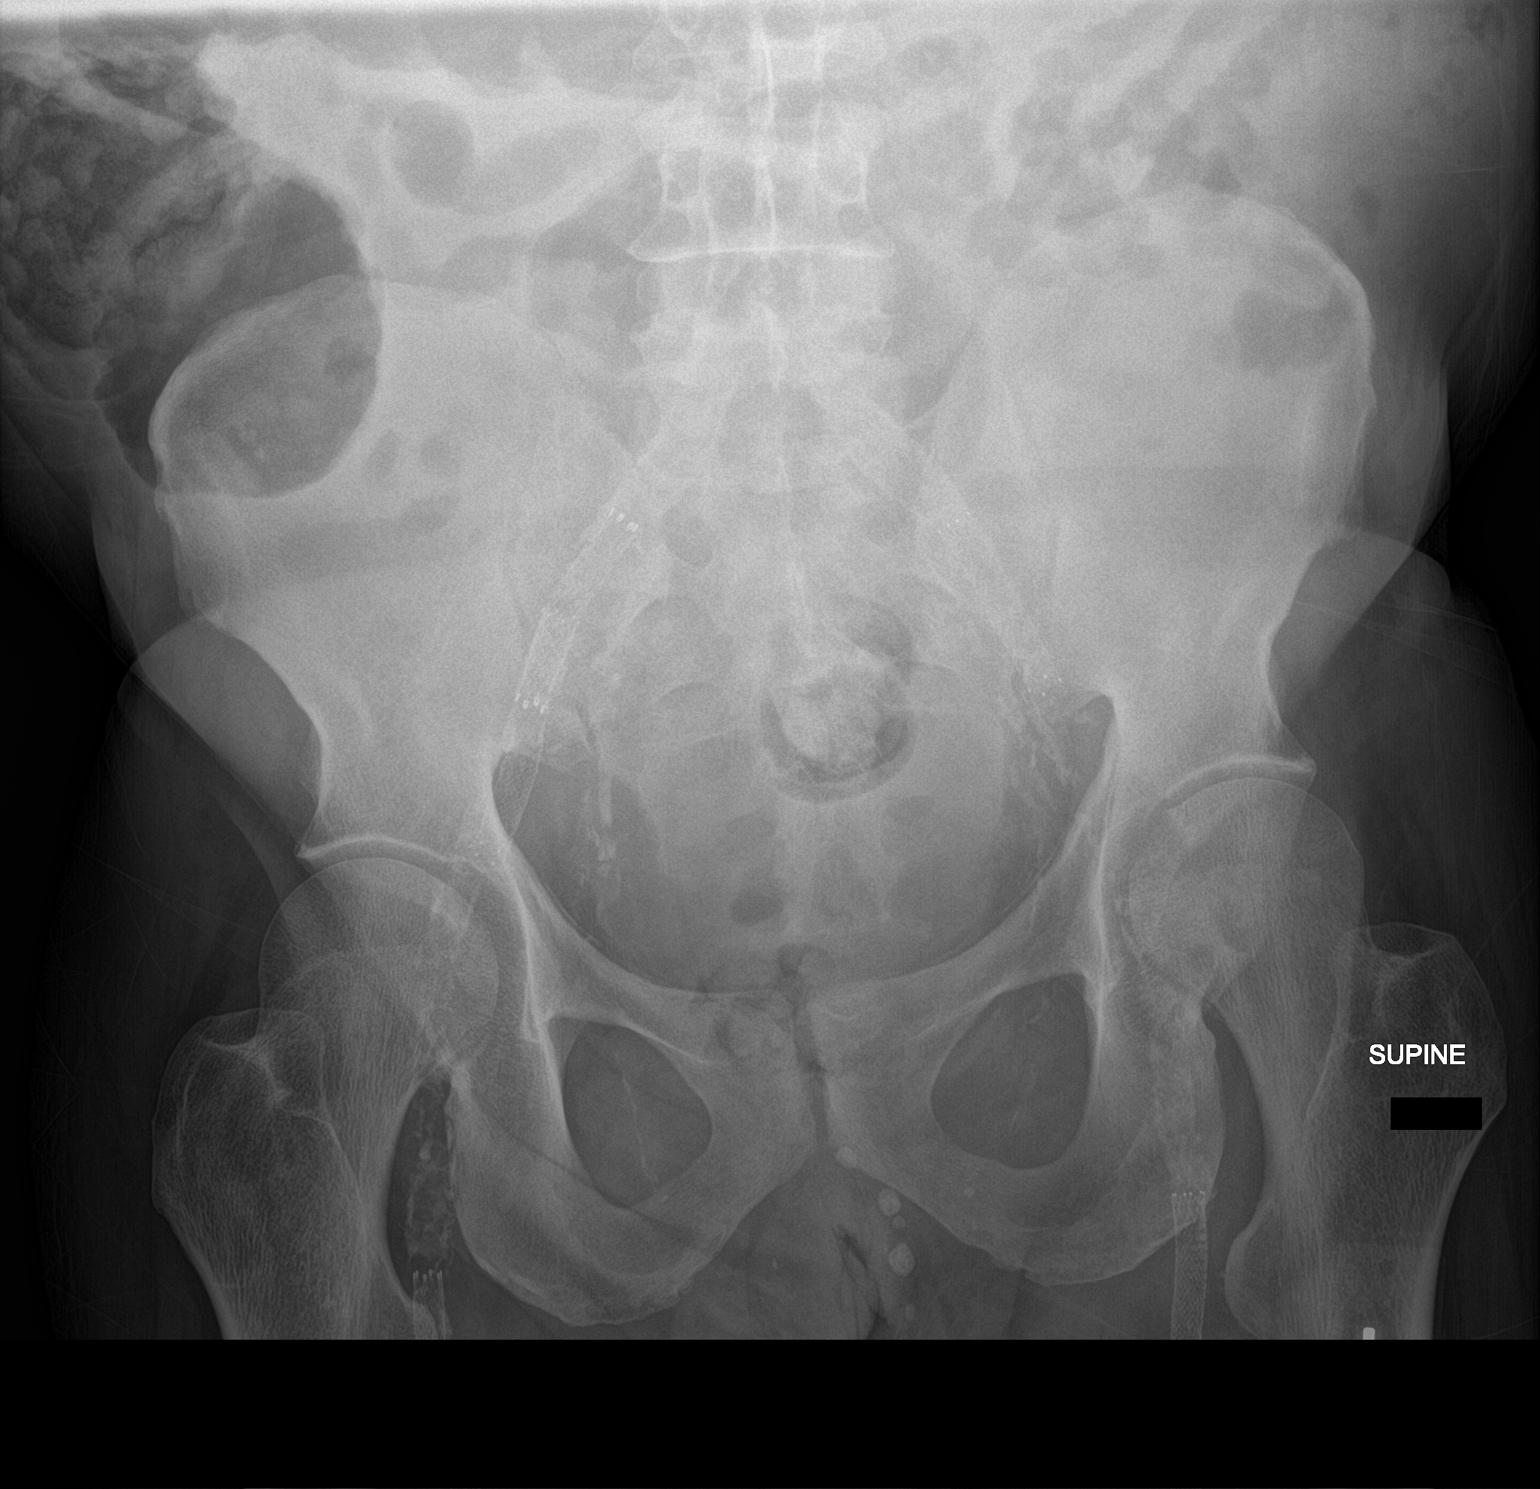

[2 of 2 positions shown; findings below may reference images not displayed]

FINDINGS: Improved bowel gas pattern since prior study. There is less
distention of the colon. Scattered loops of small bowel of air but
no distention. The soft tissue shadows of the abdomen are grossly
maintained. Stable advanced vascular calcifications with iliac
artery stents noted. The bony structures are grossly normal.
IMPRESSION: Improving bowel gas pattern with less distention of the colon.

## 2019-08-17 IMAGING — DX DG ABDOMEN 1V
2 series · 2 of 2 positions shown · non-contrast
Comparison: Abdominal x-ray from yesterday.

CLINICAL DATA: Constipation.

EXAM:
ABDOMEN - 1 VIEW

[abdomen kub (1 of 2)]
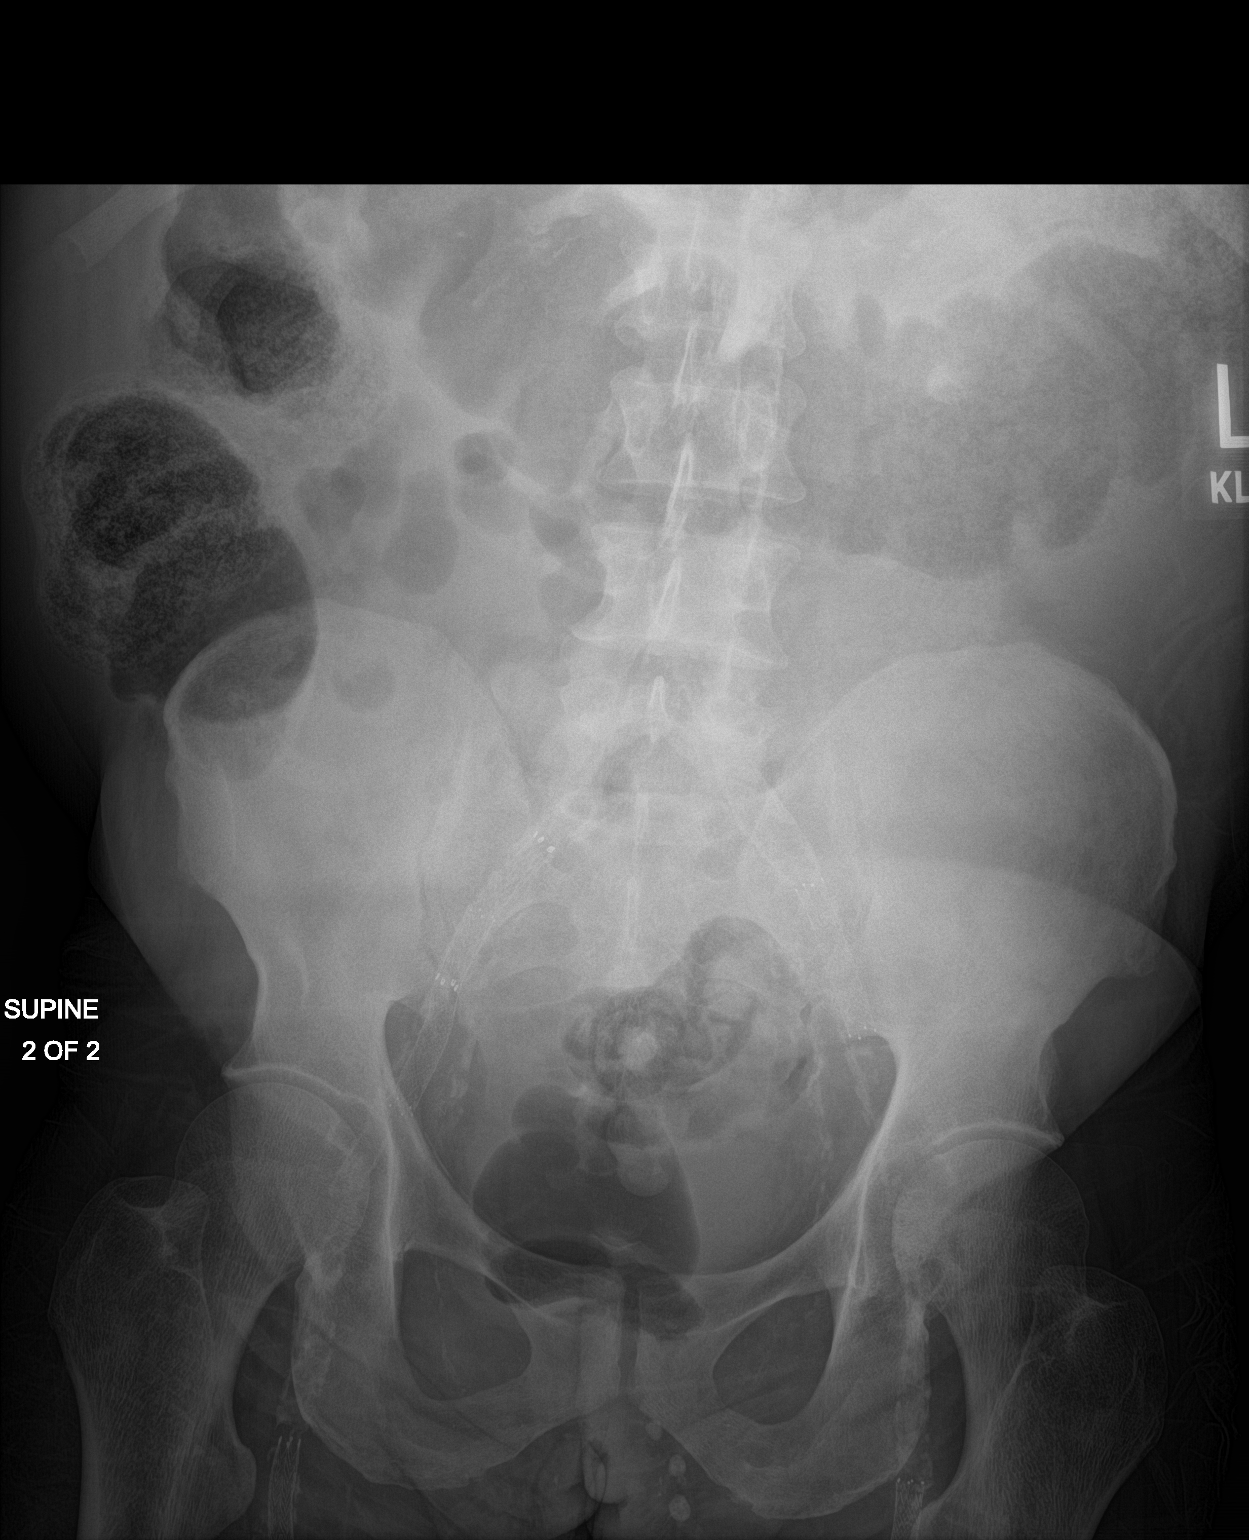

[abdomen kub (2 of 2)]
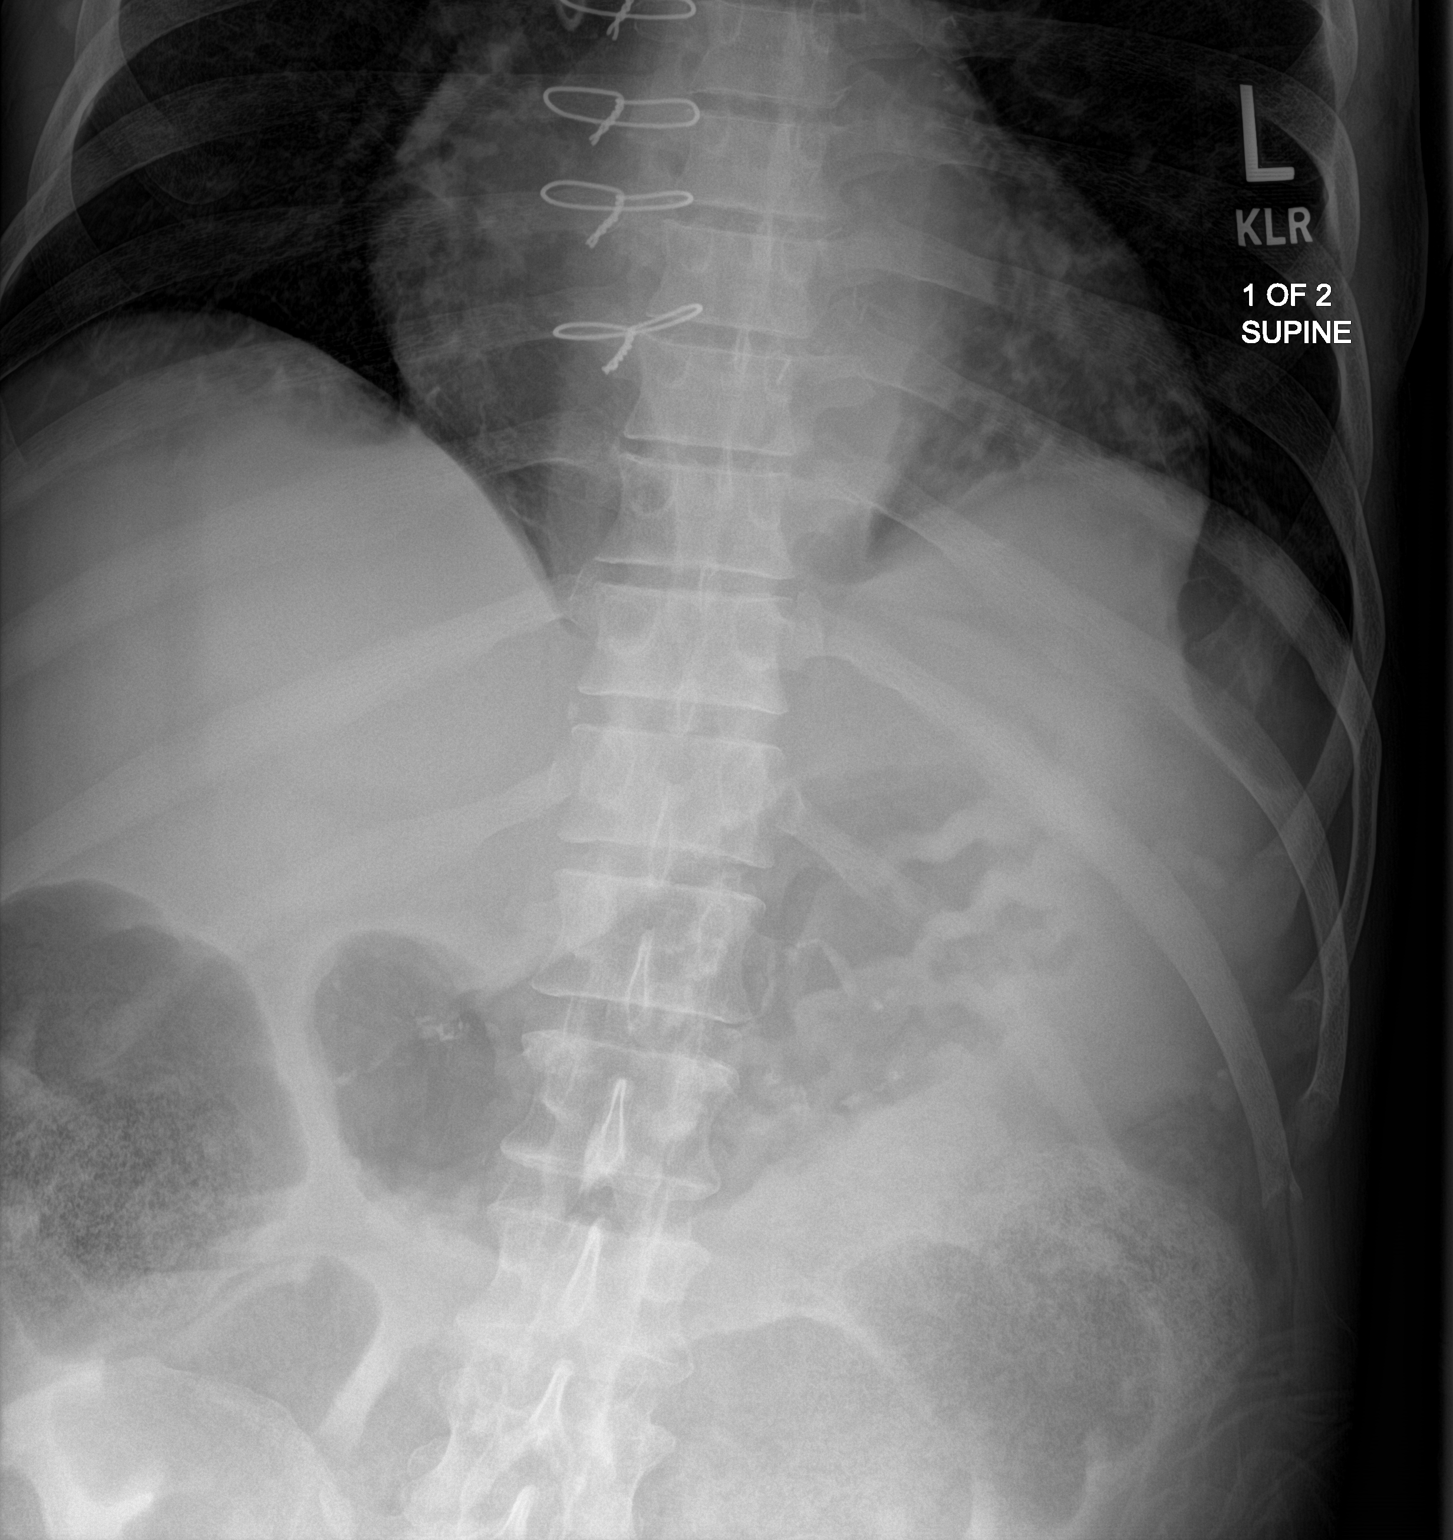

[2 of 2 positions shown; findings below may reference images not displayed]

FINDINGS: Prominent, stool-filled transverse colon, unchanged. No evidence of
obstruction. Vascular stents are again noted. No acute osseous
abnormality.
IMPRESSION: Prominent, stool-filled transverse colon, unchanged. Findings are
suggestive of constipation.

## 2019-08-27 IMAGING — US US PARACENTESIS
1 series · 4 of 4 positions shown · non-contrast
Comparison: none

INDICATION: Recurrent ascites

[Series 1: us paracentesis · 0.25mm/px · 4 of 4 slices shown]
[im 1/4]
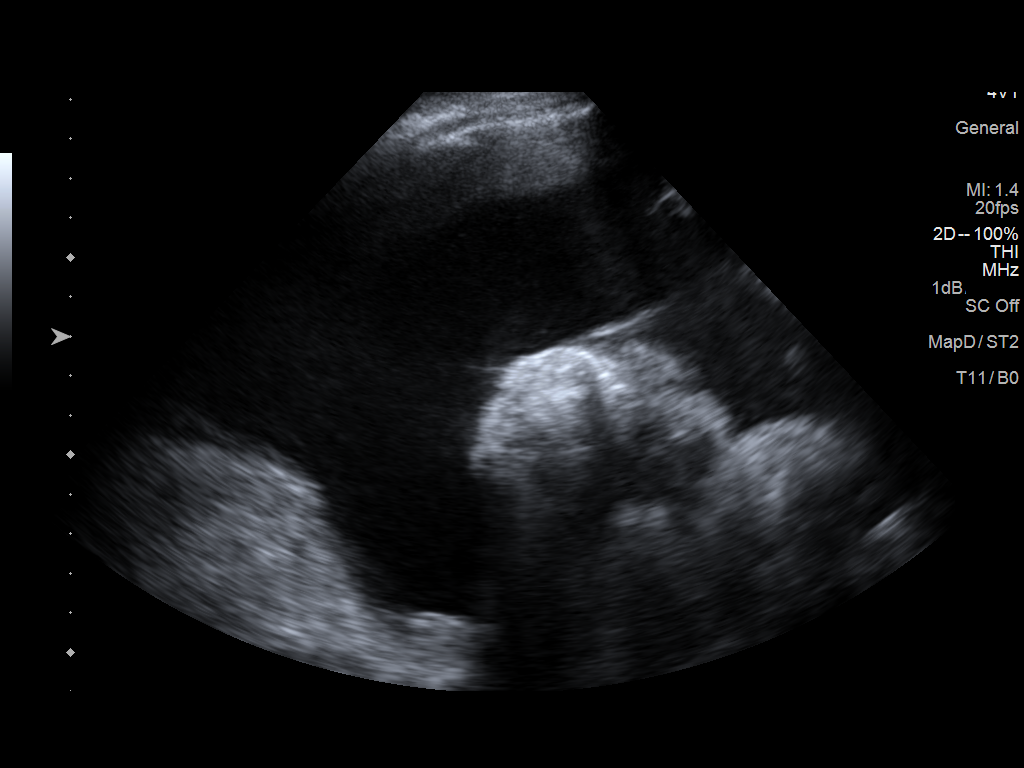
[im 2/4]
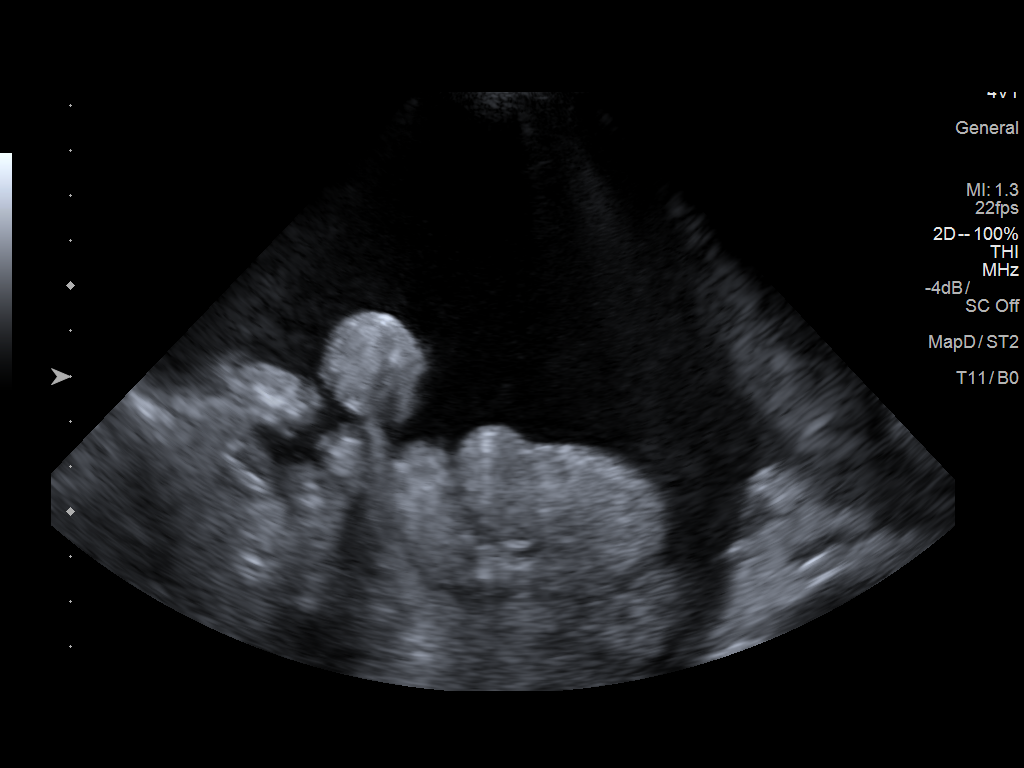
[im 3/4]
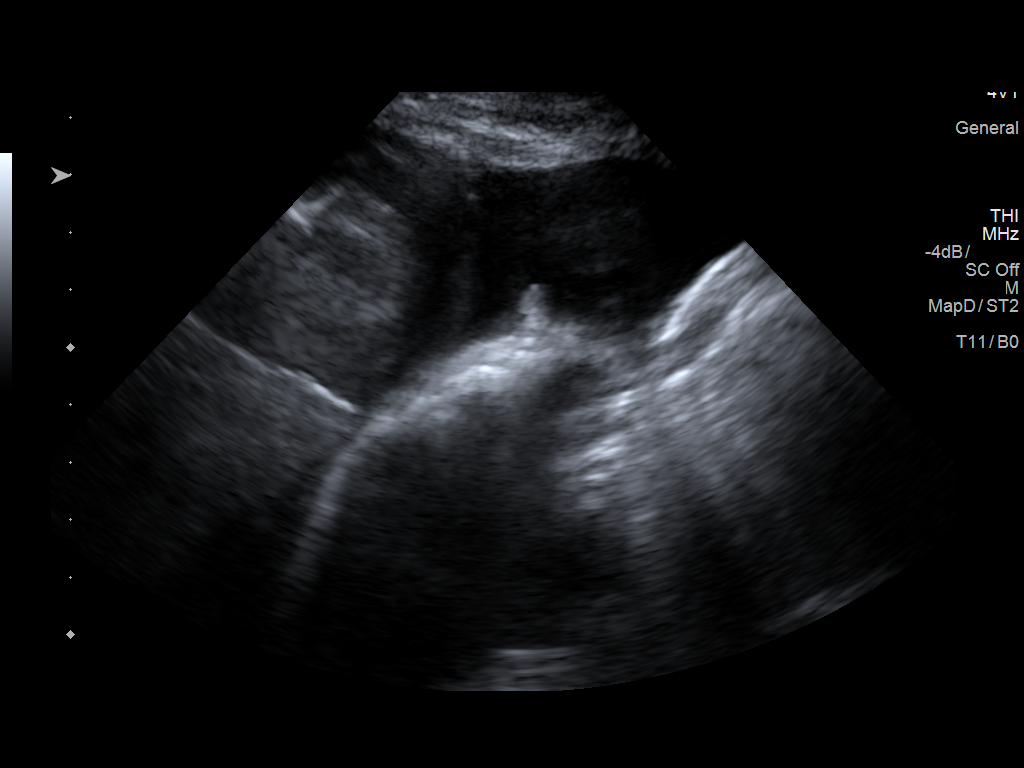
[im 4/4]
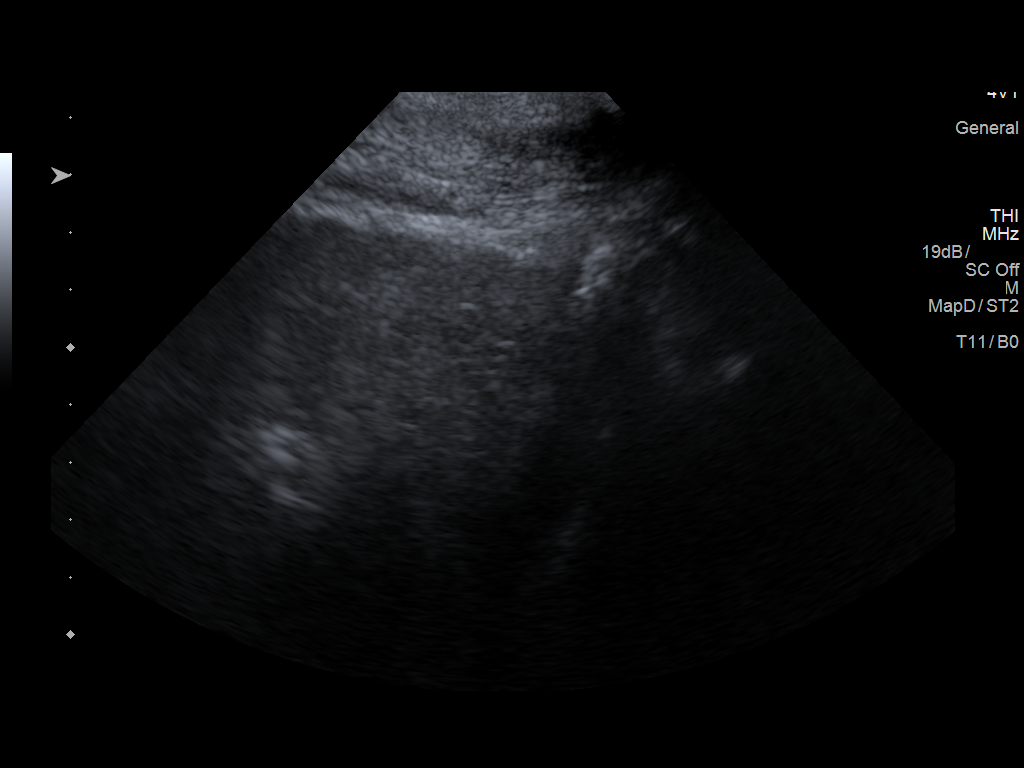

[4 of 4 positions shown; findings below may reference images not displayed]

EXAM:
ULTRASOUND GUIDED THERAPEUTIC PARACENTESIS

MEDICATIONS:
None.

COMPLICATIONS:
None immediate.

PROCEDURE:
Informed written consent was obtained from the patient after a
discussion of the risks, benefits and alternatives to treatment. A
timeout was performed prior to the initiation of the procedure.

Initial ultrasound scanning demonstrates a large amount of ascites
within the right abdomen The right abdomen was prepped and draped in
the usual sterile fashion. 1% lidocaine was used for local
anesthesia.

Following this, a 6 Fr Safe-T-Centesis catheter was introduced. An
ultrasound image was saved for documentation purposes. The
paracentesis was performed. The catheter was removed and a dressing
was applied. The patient tolerated the procedure well without
immediate post procedural complication.
FINDINGS: A total of approximately 9 L of clear yellow fluid was removed.
Samples were sent to the laboratory as requested by the clinical
team.
IMPRESSION: Successful ultrasound-guided paracentesis yielding 9 liters of
peritoneal fluid.

## 2019-09-23 IMAGING — DX DG CHEST 1V PORT
1 series · 1 of 1 positions shown · non-contrast
Comparison: 01/07/2017

CLINICAL DATA: Nose bleed.  Dialysis patient.

EXAM:
PORTABLE CHEST 1 VIEW

[chest ap]
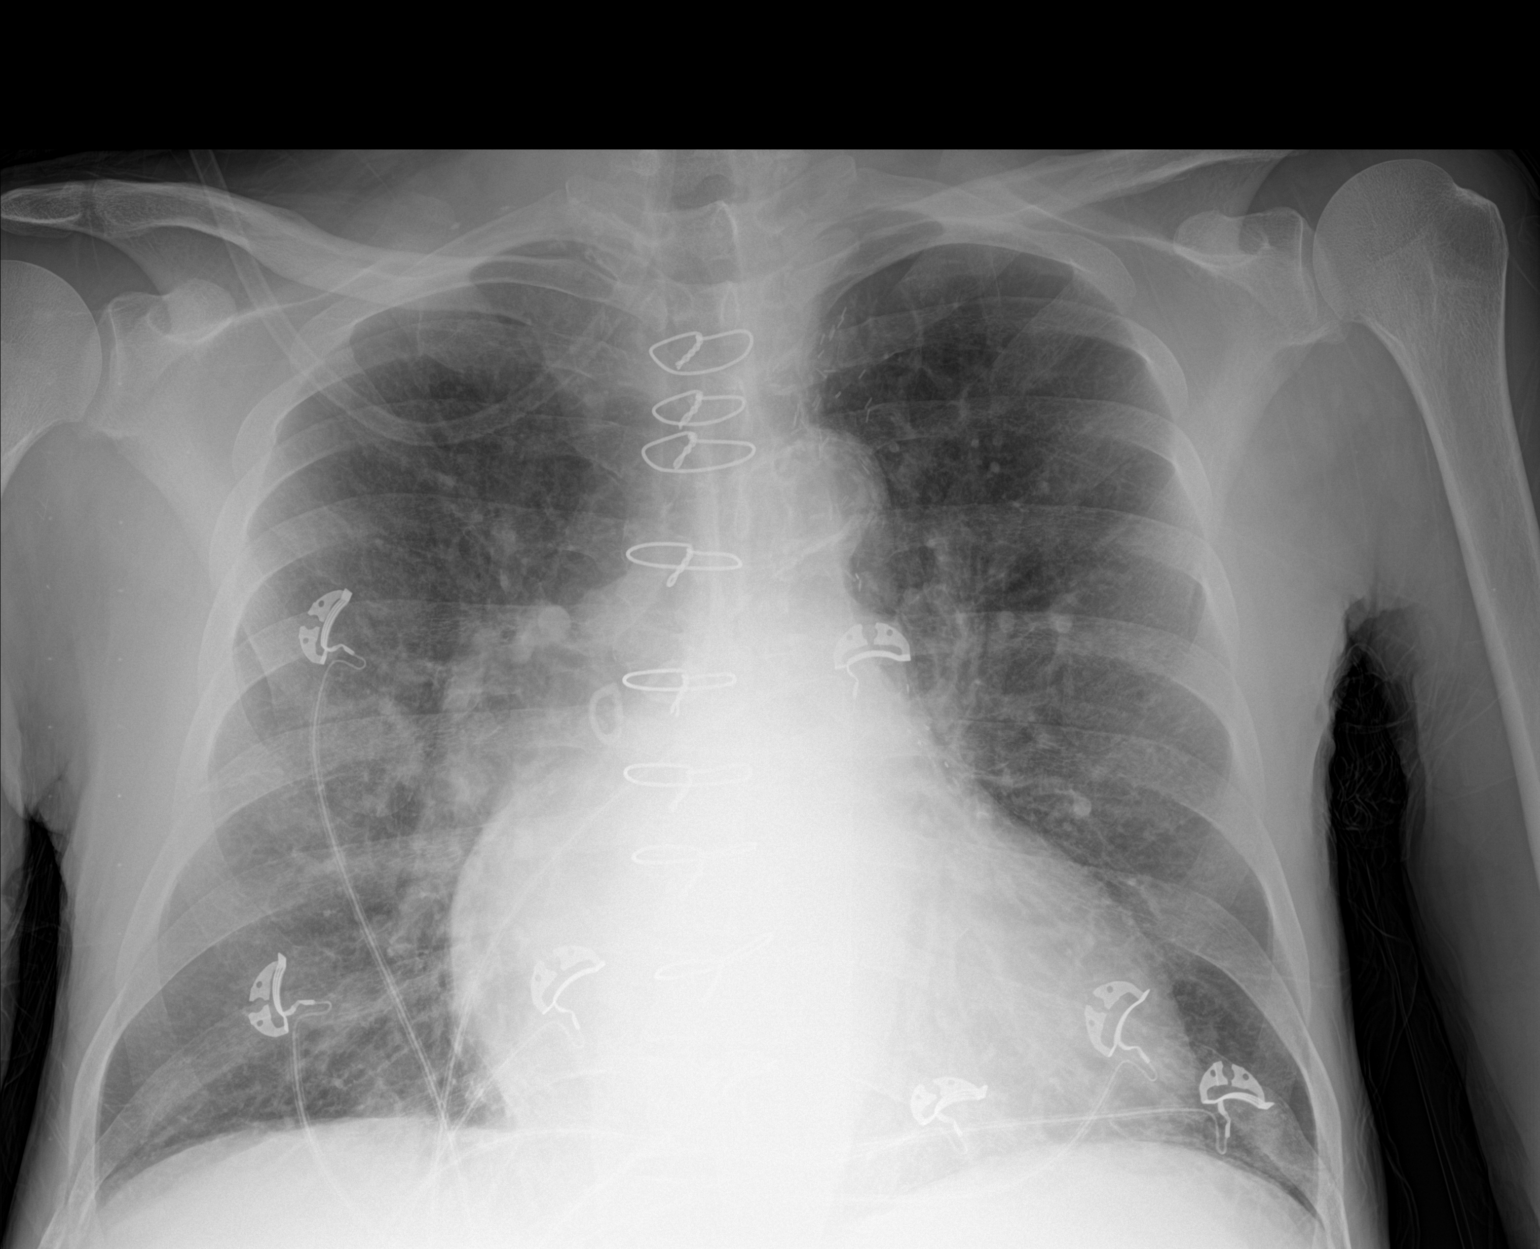

[1 of 1 positions shown; findings below may reference images not displayed]

FINDINGS: Previous median sternotomy and CABG. Chronic cardiomegaly and aortic
atherosclerosis. Abnormal patchy density in the mid and lower lungs
bilaterally that could represent pneumonia, aspiration or early
edema. No effusions. No acute bone finding.
IMPRESSION: Patchy density in the mid and lower lungs bilaterally that could
represent pneumonia, aspiration or early edema.

## 2019-09-26 IMAGING — DX DG CHEST 1V PORT
2 series · 2 of 2 positions shown · non-contrast
Comparison: Portable chest x-ray February 19, 2017

CLINICAL DATA: Weakness, lethargy, malaise. Unable to tolerate
paracentesis today. History of CHF, cirrhosis, diabetes, coronary
artery disease, current smoker.

EXAM:
PORTABLE CHEST 1 VIEW

[chest ap (1 of 2)]
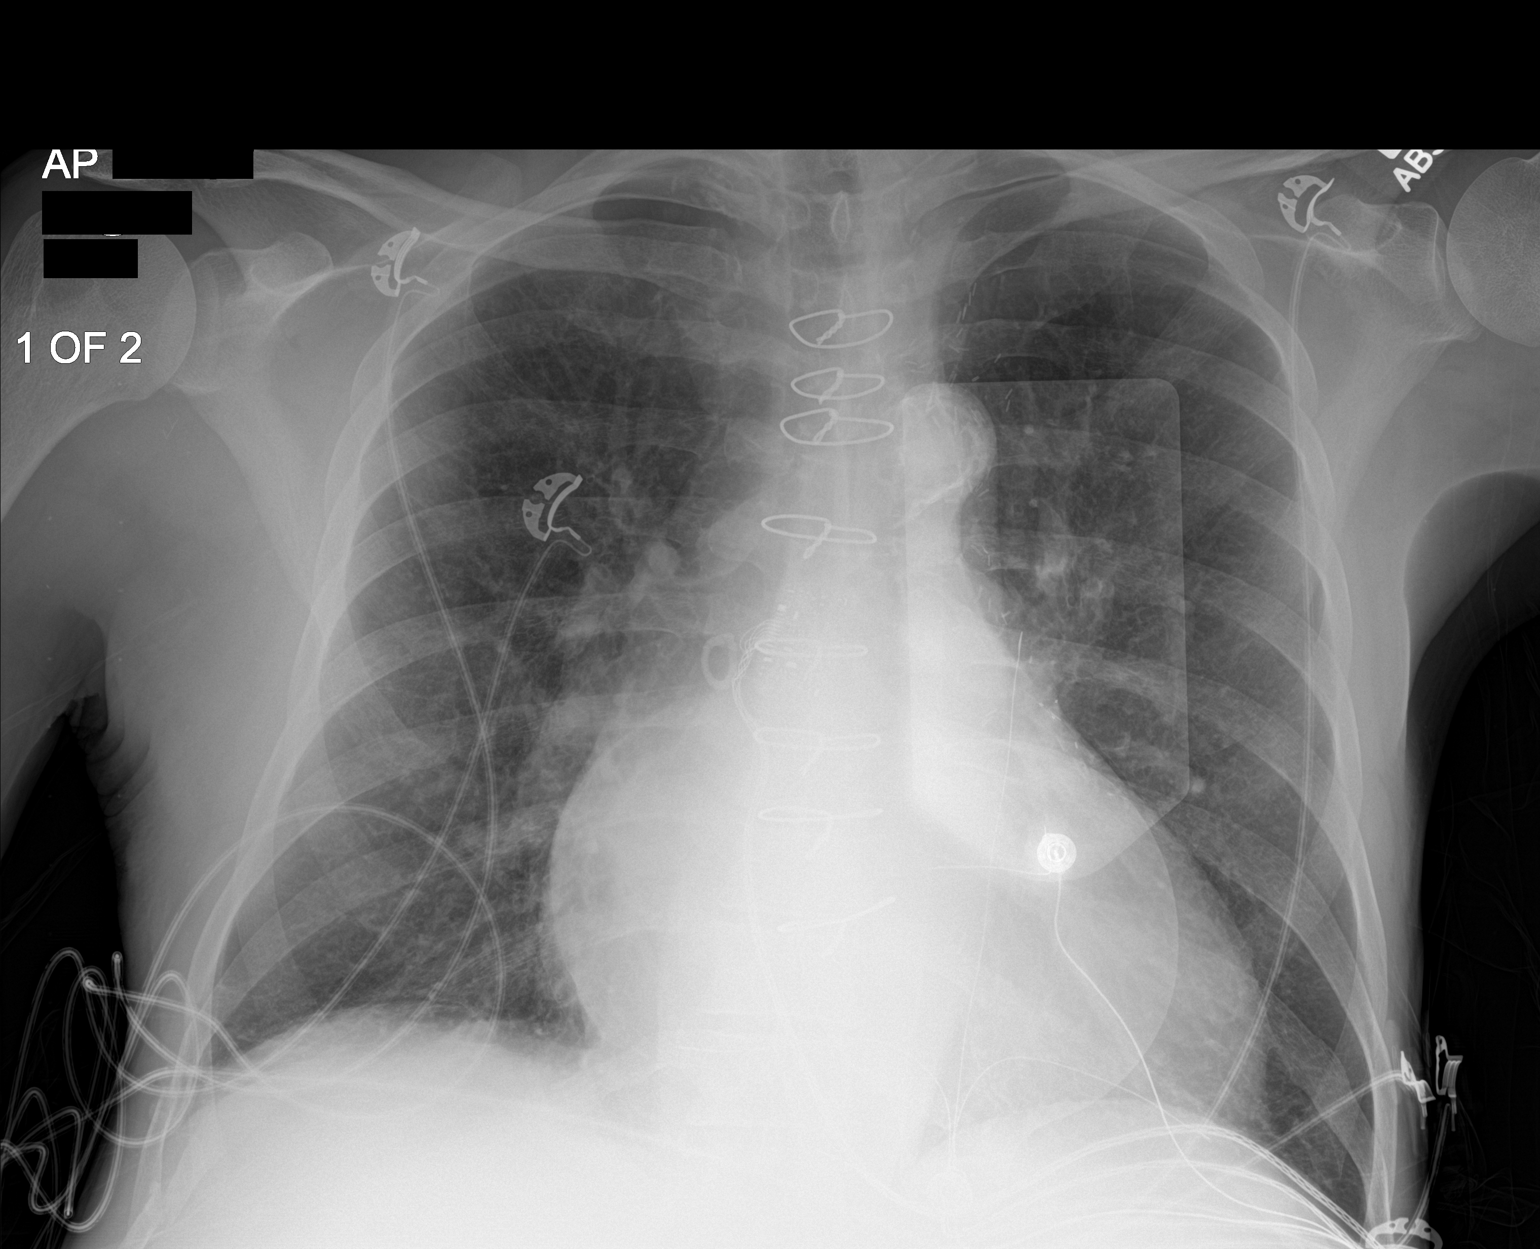

[chest ap (2 of 2)]
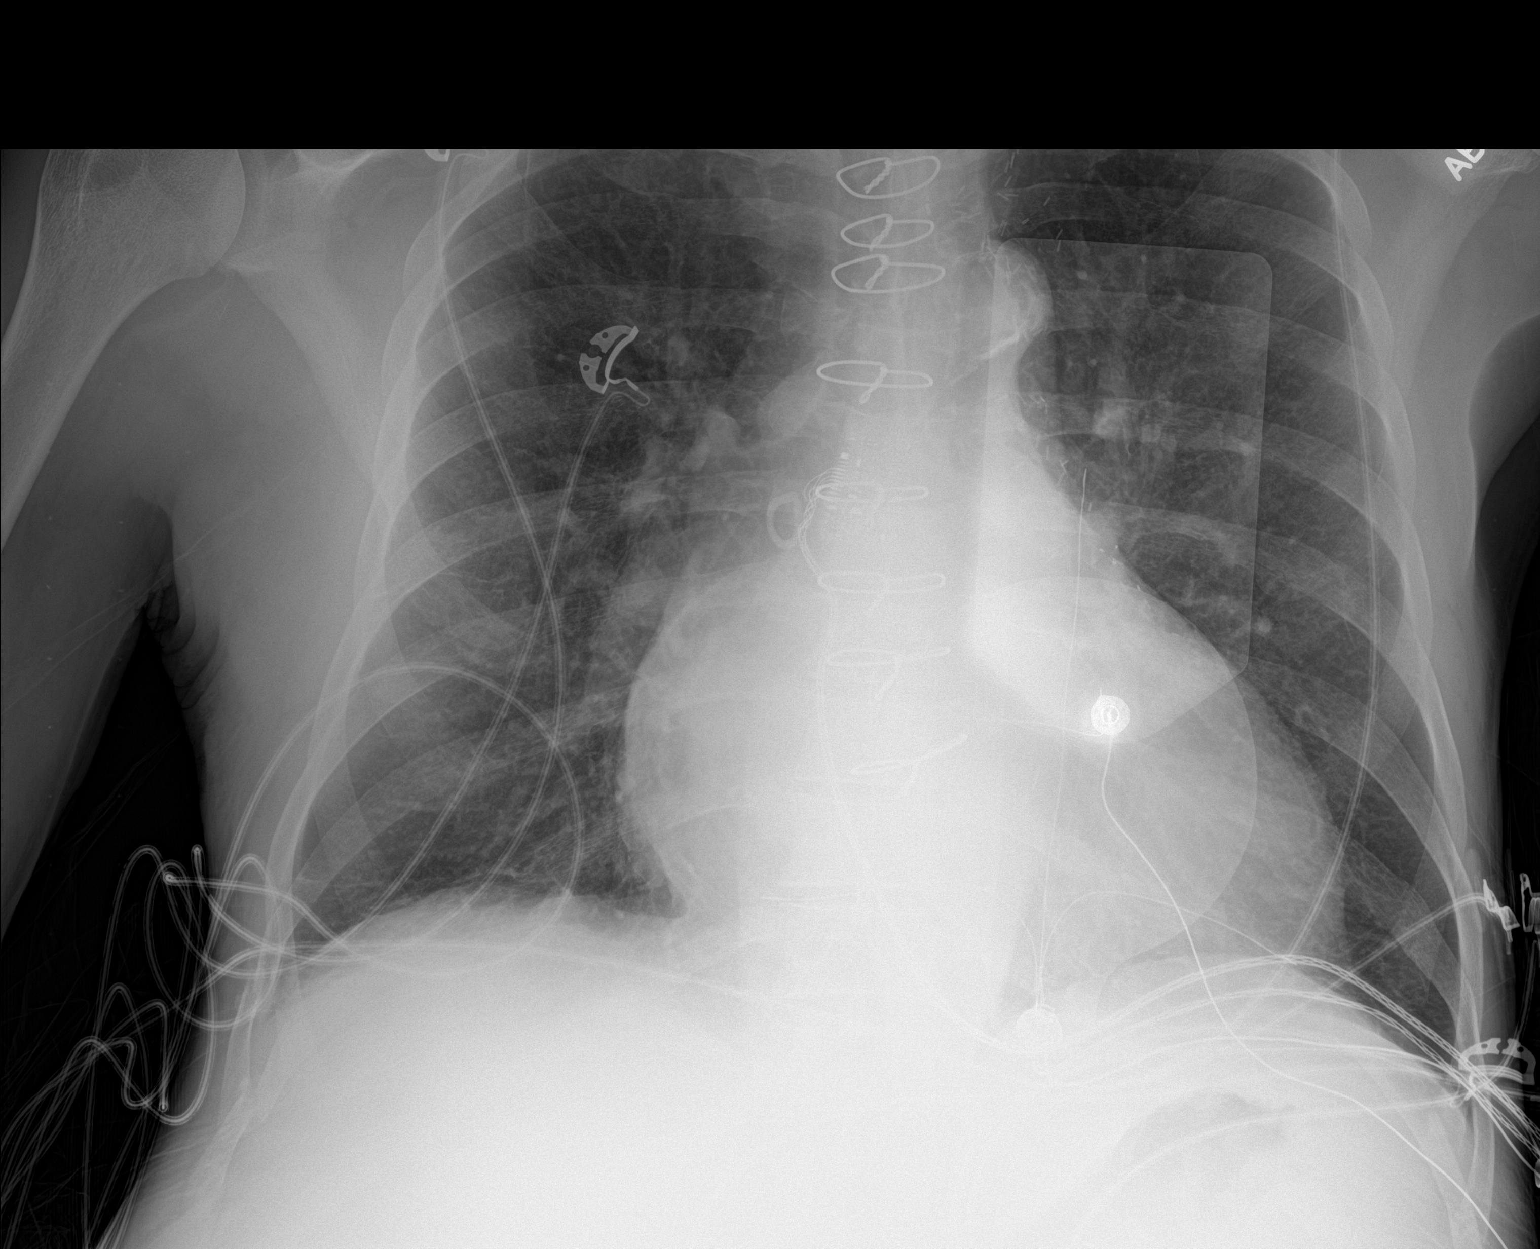

[2 of 2 positions shown; findings below may reference images not displayed]

FINDINGS: The lungs are adequately inflated. There is no focal infiltrate.
There is no pleural effusion. The cardiac silhouette is mildly
enlarged. The central pulmonary vascularity is prominent but there
is no interstitial edema. External pacemaker defibrillator pads are
present. There is calcification in the wall of the aortic arch. The
sternal wires are intact.
IMPRESSION: Cardiomegaly and central pulmonary vascular congestion without
pulmonary edema. No acute pneumonia.

Thoracic aortic atherosclerosis.

## 2019-09-28 IMAGING — US US PARACENTESIS
1 series · 9 of 9 positions shown · non-contrast
Comparison: none

INDICATION: Patient with recurrent ascites. Request is made for therapeutic
paracentesis.

[Series 1: us paracentesis · 0.26mm/px · 9 of 9 slices shown]
[im 1/9]
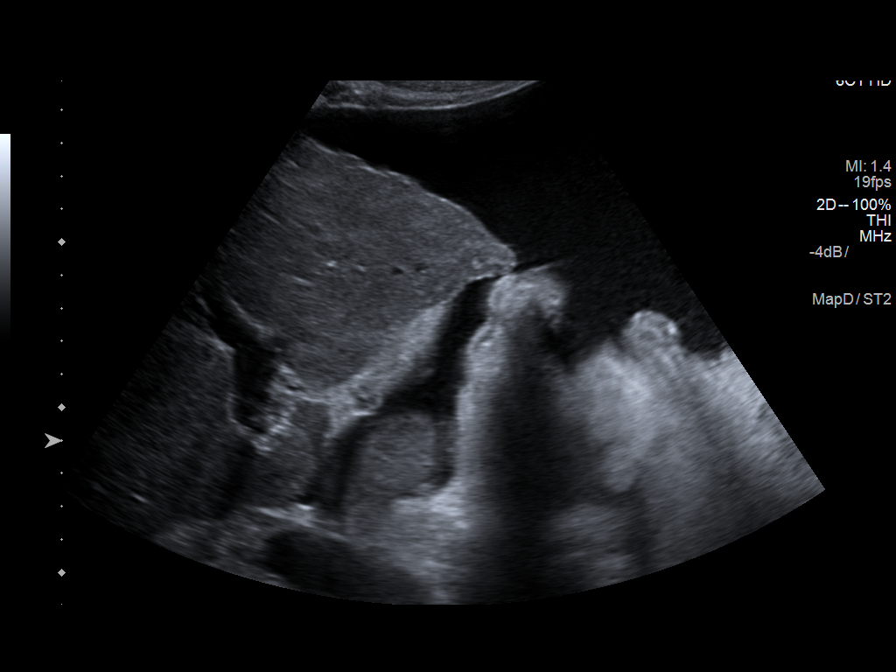
[im 2/9]
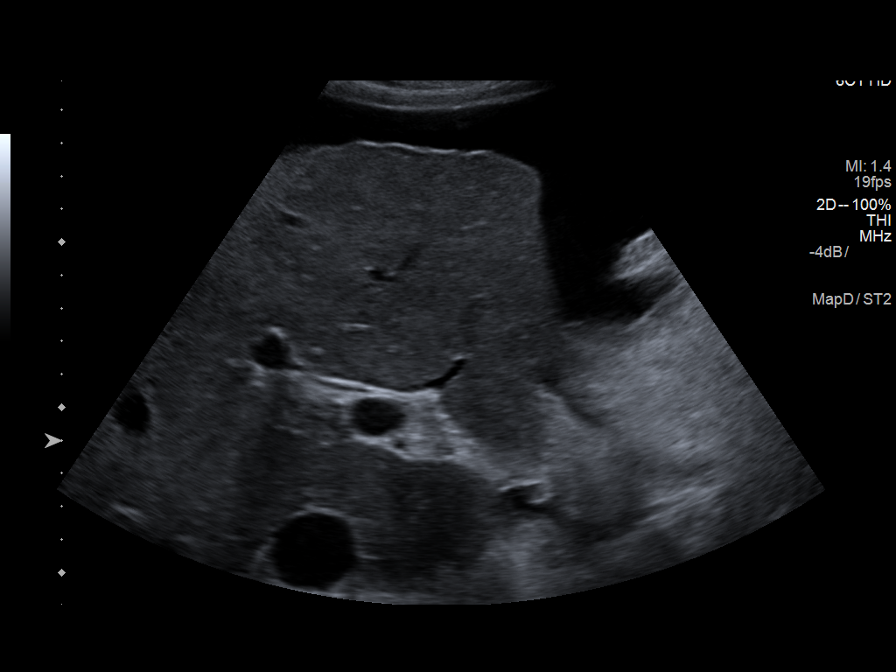
[im 3/9]
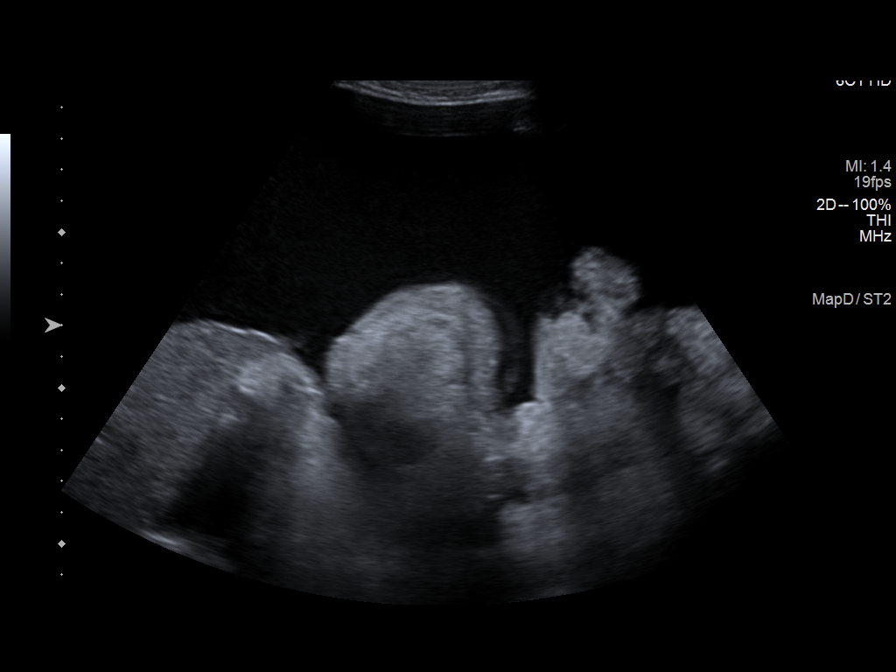
[im 4/9]
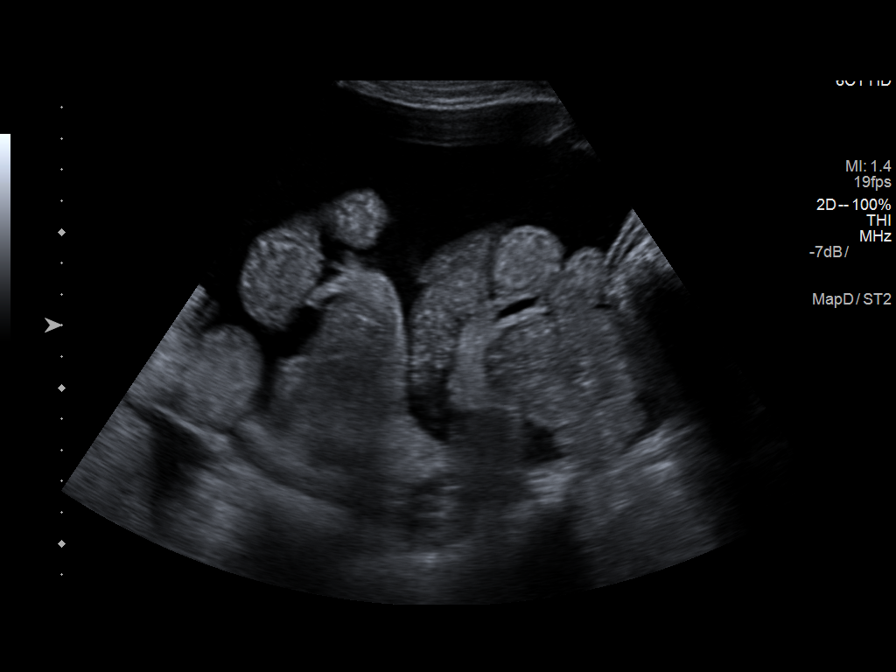
[im 5/9]
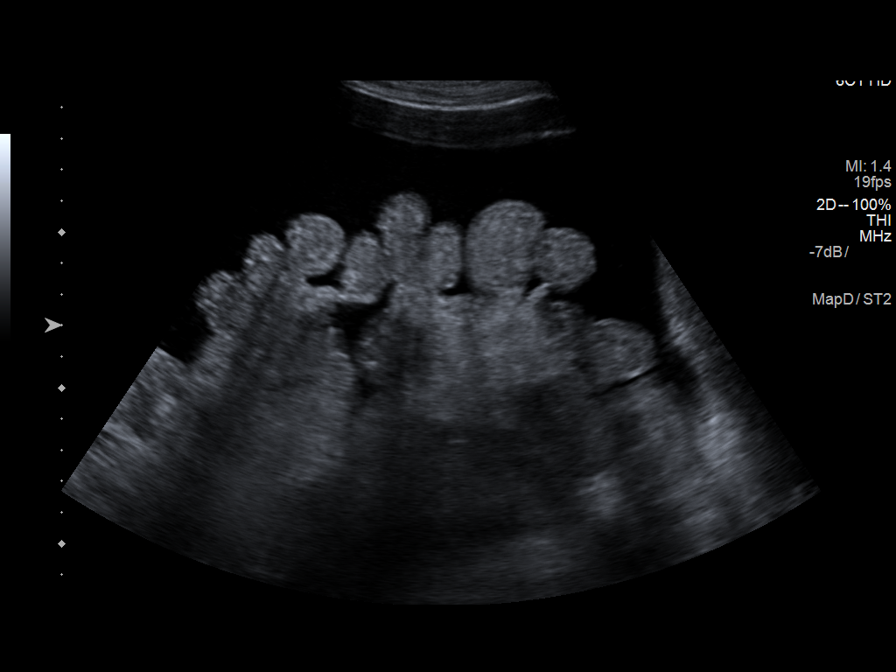
[im 6/9]
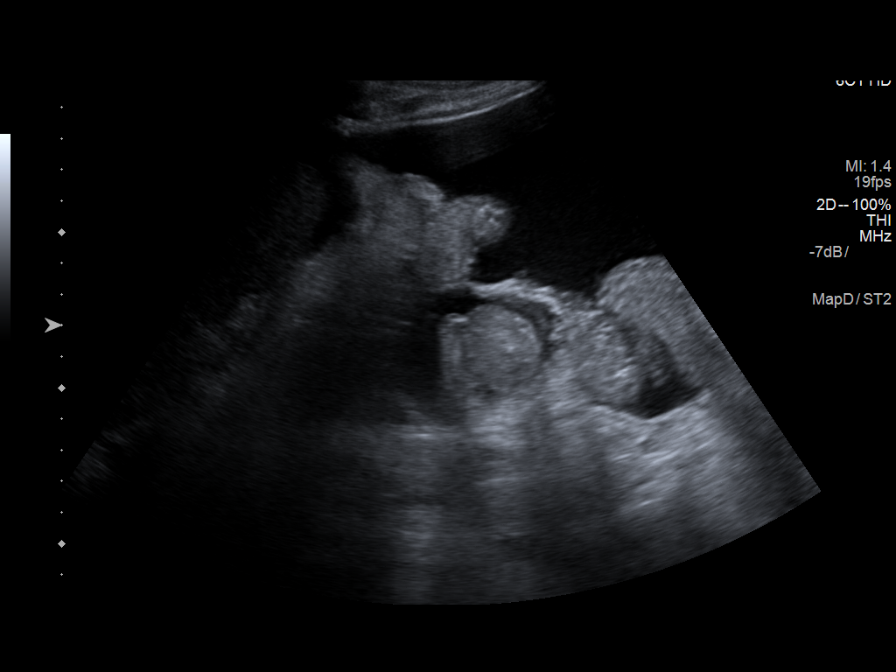
[im 7/9]
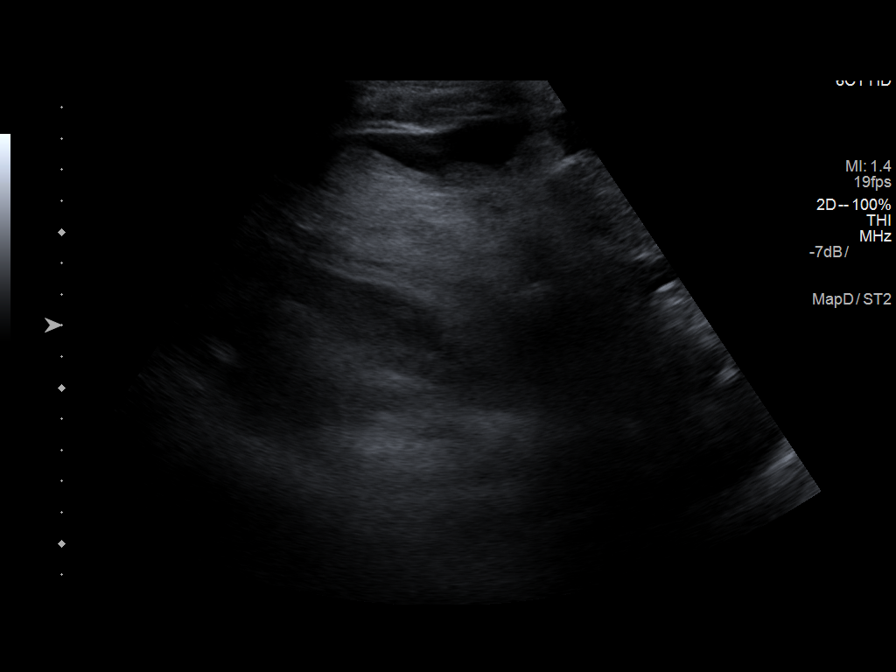
[im 8/9]
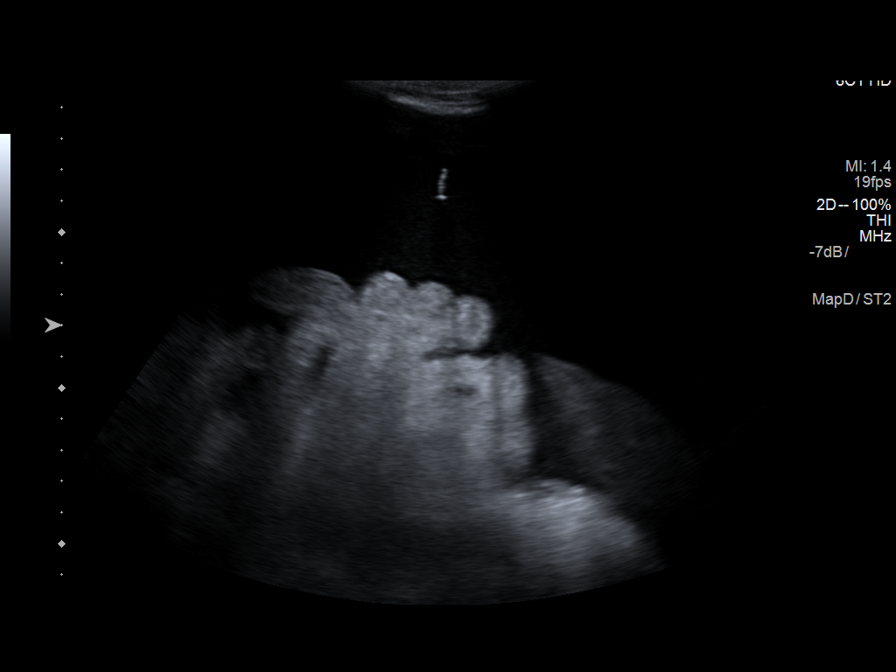
[im 9/9]
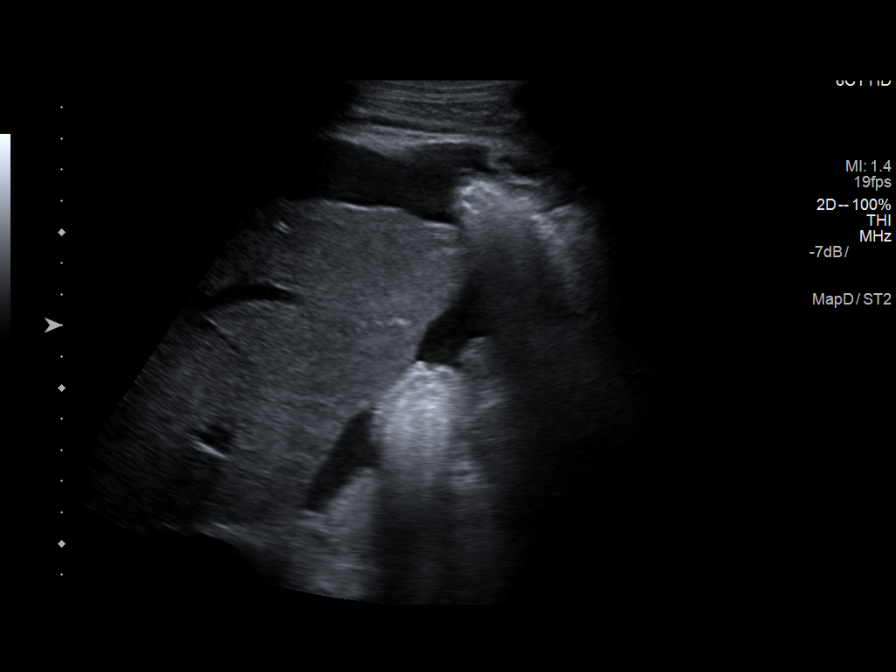

[9 of 9 positions shown; findings below may reference images not displayed]

EXAM:
ULTRASOUND GUIDED THERAPEUTIC PARACENTESIS

MEDICATIONS:
5 mL 1% lidocaine

COMPLICATIONS:
None immediate.

PROCEDURE:
Informed written consent was obtained from the patient after a
discussion of the risks, benefits and alternatives to treatment. A
timeout was performed prior to the initiation of the procedure.

Initial ultrasound scanning demonstrates a large amount of ascites
within the right lateral abdomen. The right lateral abdomen was
prepped and draped in the usual sterile fashion. 1% lidocaine was
used for local anesthesia.

Following this, a 19 gauge, 10-cm, Yueh catheter was introduced. An
ultrasound image was saved for documentation purposes. The
paracentesis was performed. The catheter was removed and a dressing
was applied. The patient tolerated the procedure well without
immediate post procedural complication.
FINDINGS: A total of approximately 7.7 liters of yellow fluid was removed.
IMPRESSION: Successful ultrasound-guided therapeutic paracentesis yielding
liters of peritoneal fluid.

## 2019-10-02 IMAGING — DX DG CHEST 1V PORT
1 series · 1 of 1 positions shown · non-contrast
Comparison: Six days ago

CLINICAL DATA: Generalized malaise.  Missed dialysis

EXAM:
PORTABLE CHEST 1 VIEW

[chest ap]
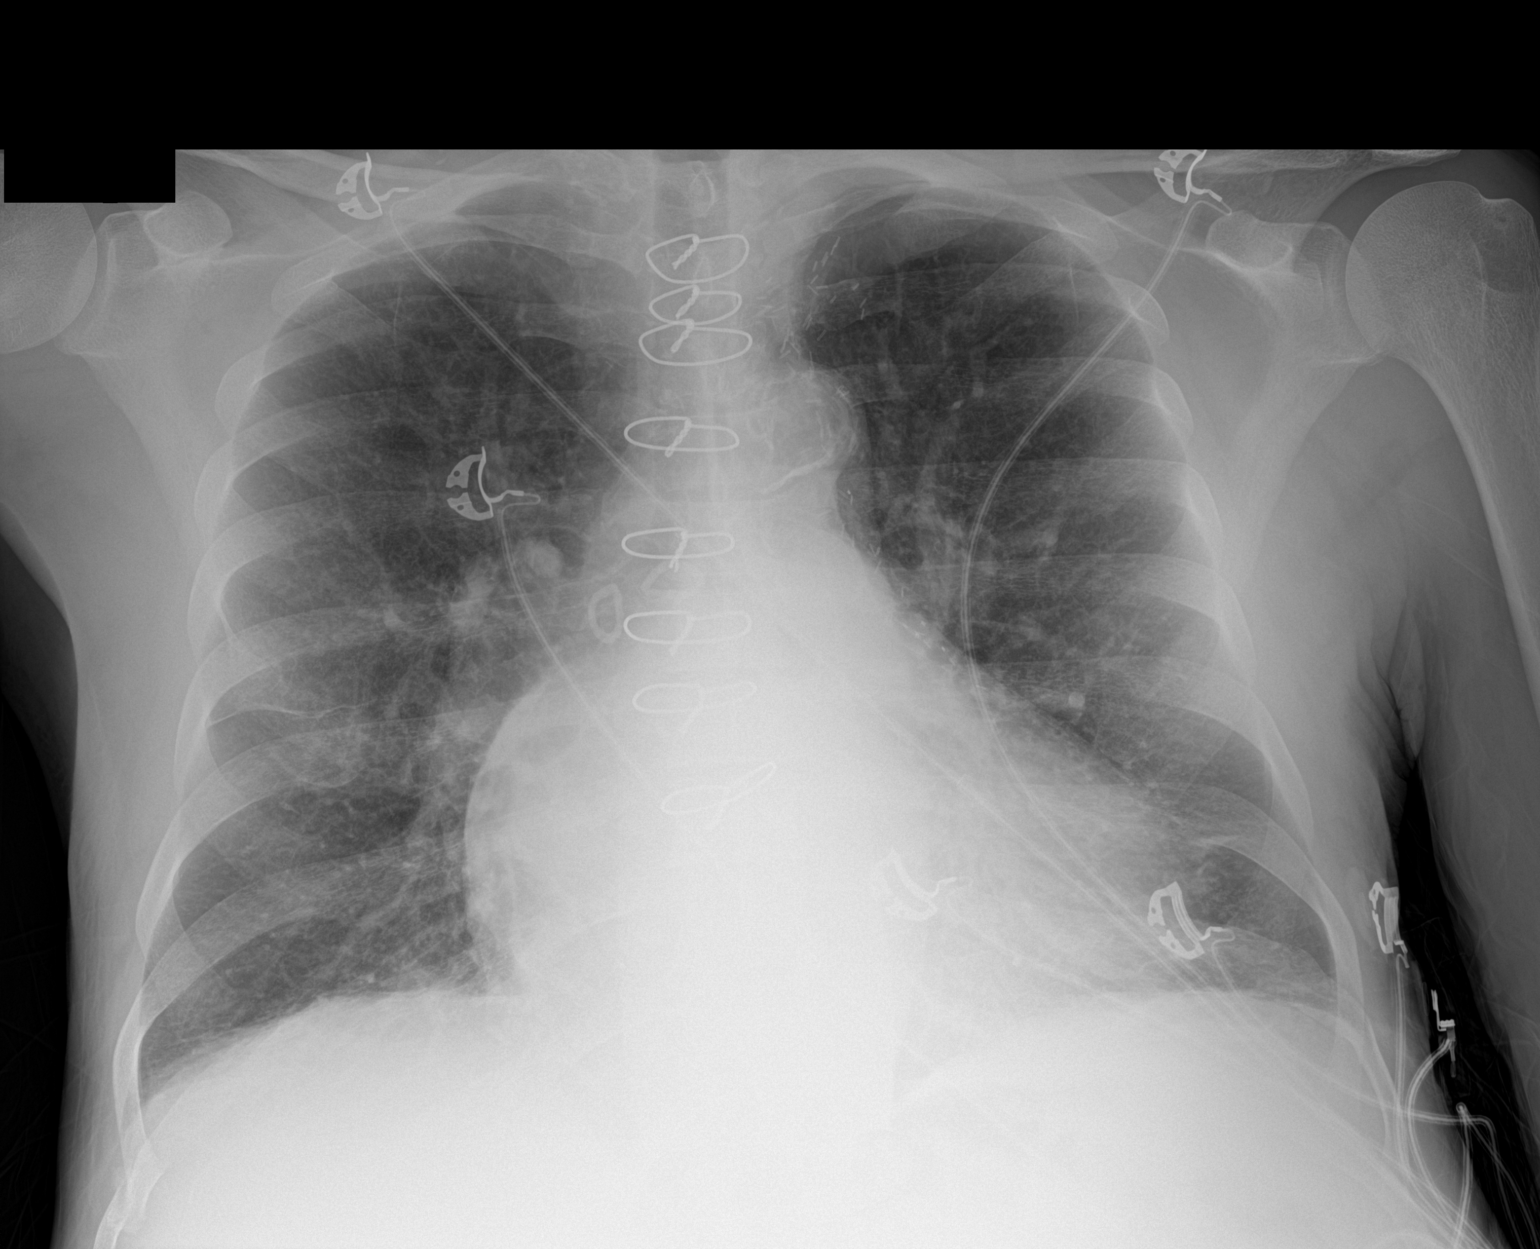

[1 of 1 positions shown; findings below may reference images not displayed]

FINDINGS: Chronic cardiopericardial enlargement. Status post CABG. Chronic
vascular congestion and interstitial coarsening. No pneumonia,
effusion, or definite Kerley lines.
IMPRESSION: Cardiomegaly and vascular congestion similar to 6 days prior.

## 2019-10-10 IMAGING — US US PARACENTESIS
1 series · 3 of 3 positions shown · non-contrast
Comparison: none

CLINICAL DATA: Ascites, recurrent.  End-stage renal disease.

EXAM:
ULTRASOUND GUIDED PARACENTESIS
TECHNIQUE: The procedure, risks (including but not limited to bleeding,
infection, organ damage ), benefits, and alternatives were explained
to the patient. Questions regarding the procedure were encouraged
and answered. The patient understands and consents to the procedure.
Survey ultrasound of the abdomen was performed and an appropriate
skin entry site in the right lateral abdomen was selected. Skin site
was marked, prepped with chlorhexadine, draped in usual sterile
fashion, and infiltrated locally with 1% lidocaine. A 5F
multisidehole Doby Karang needle was advanced into the peritoneal
space until fluid could be aspirated. The sheath was advanced and
the needle removed. 7.3 L of clear yellow ascites were aspirated.
The patient tolerated the procedure well, without immediate
complication.
COMPLICATIONS:
COMPLICATIONS
none

[Series 1: us paracentesis · 0.26mm/px · 3 of 3 slices shown]
[im 1/3]
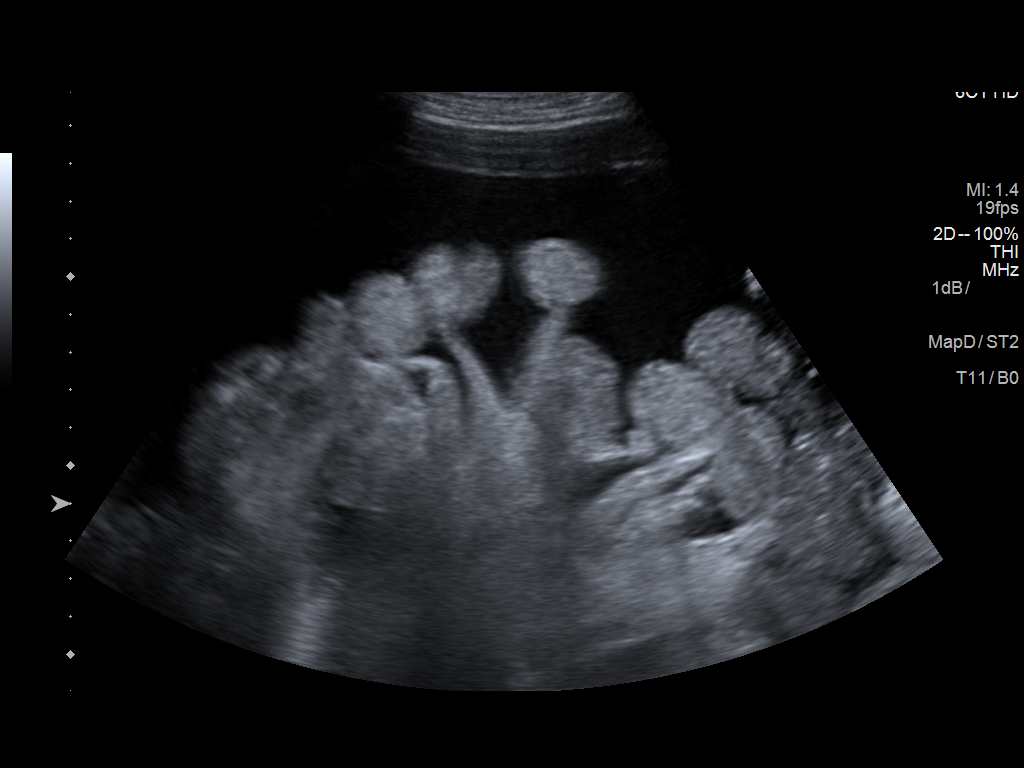
[im 2/3]
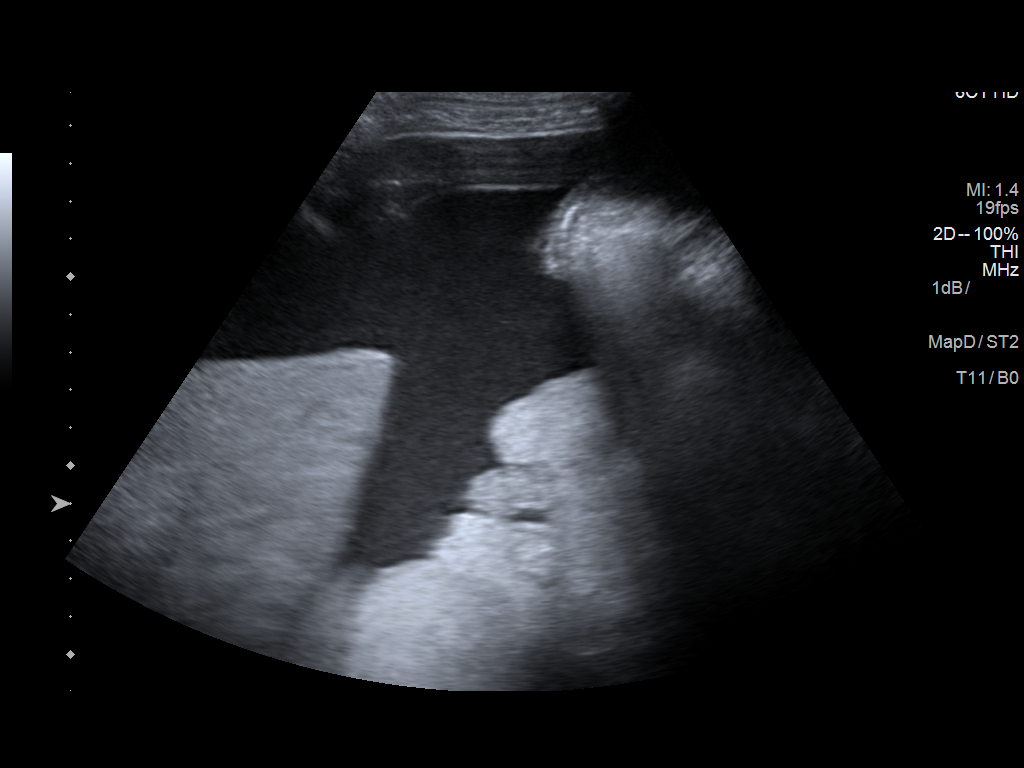
[im 3/3]
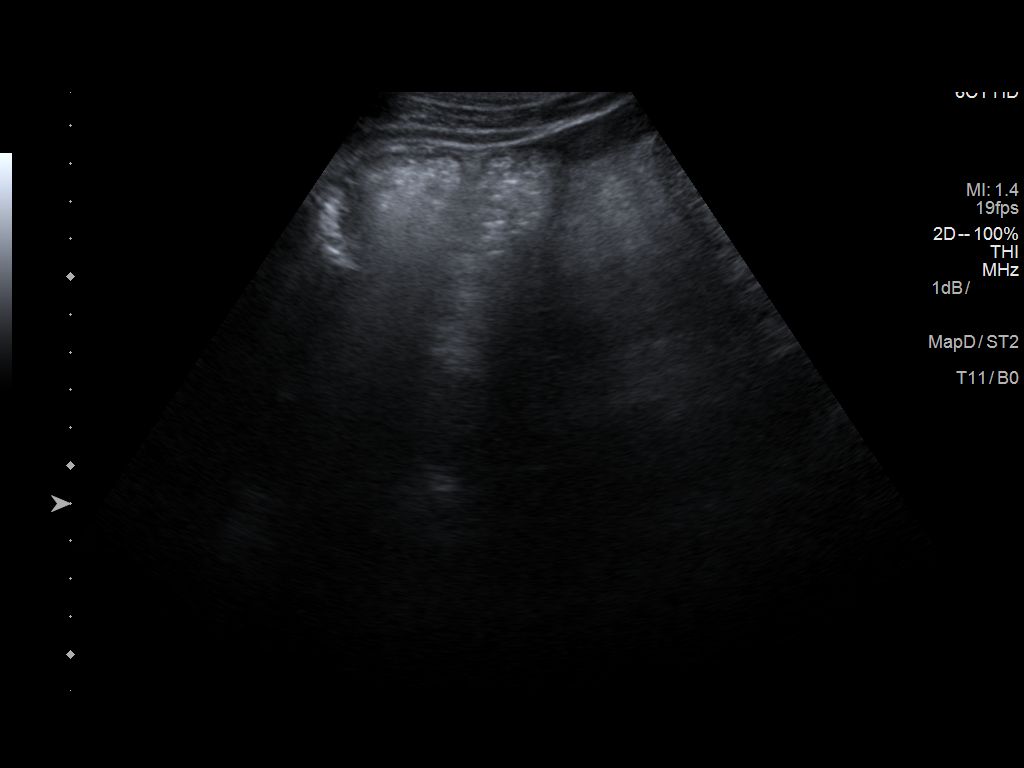

[3 of 3 positions shown; findings below may reference images not displayed]

IMPRESSION: Technically successful ultrasound guided paracentesis, removing
L ascites.

## 2019-10-10 IMAGING — CR DG CHEST 2V
2 series · 2 of 2 positions shown · non-contrast
Comparison: Chest x-ray dated 02/28/2017.

CLINICAL DATA: Dyspnea.  Dialysis patient.

EXAM:
CHEST  2 VIEW

[chest pa]
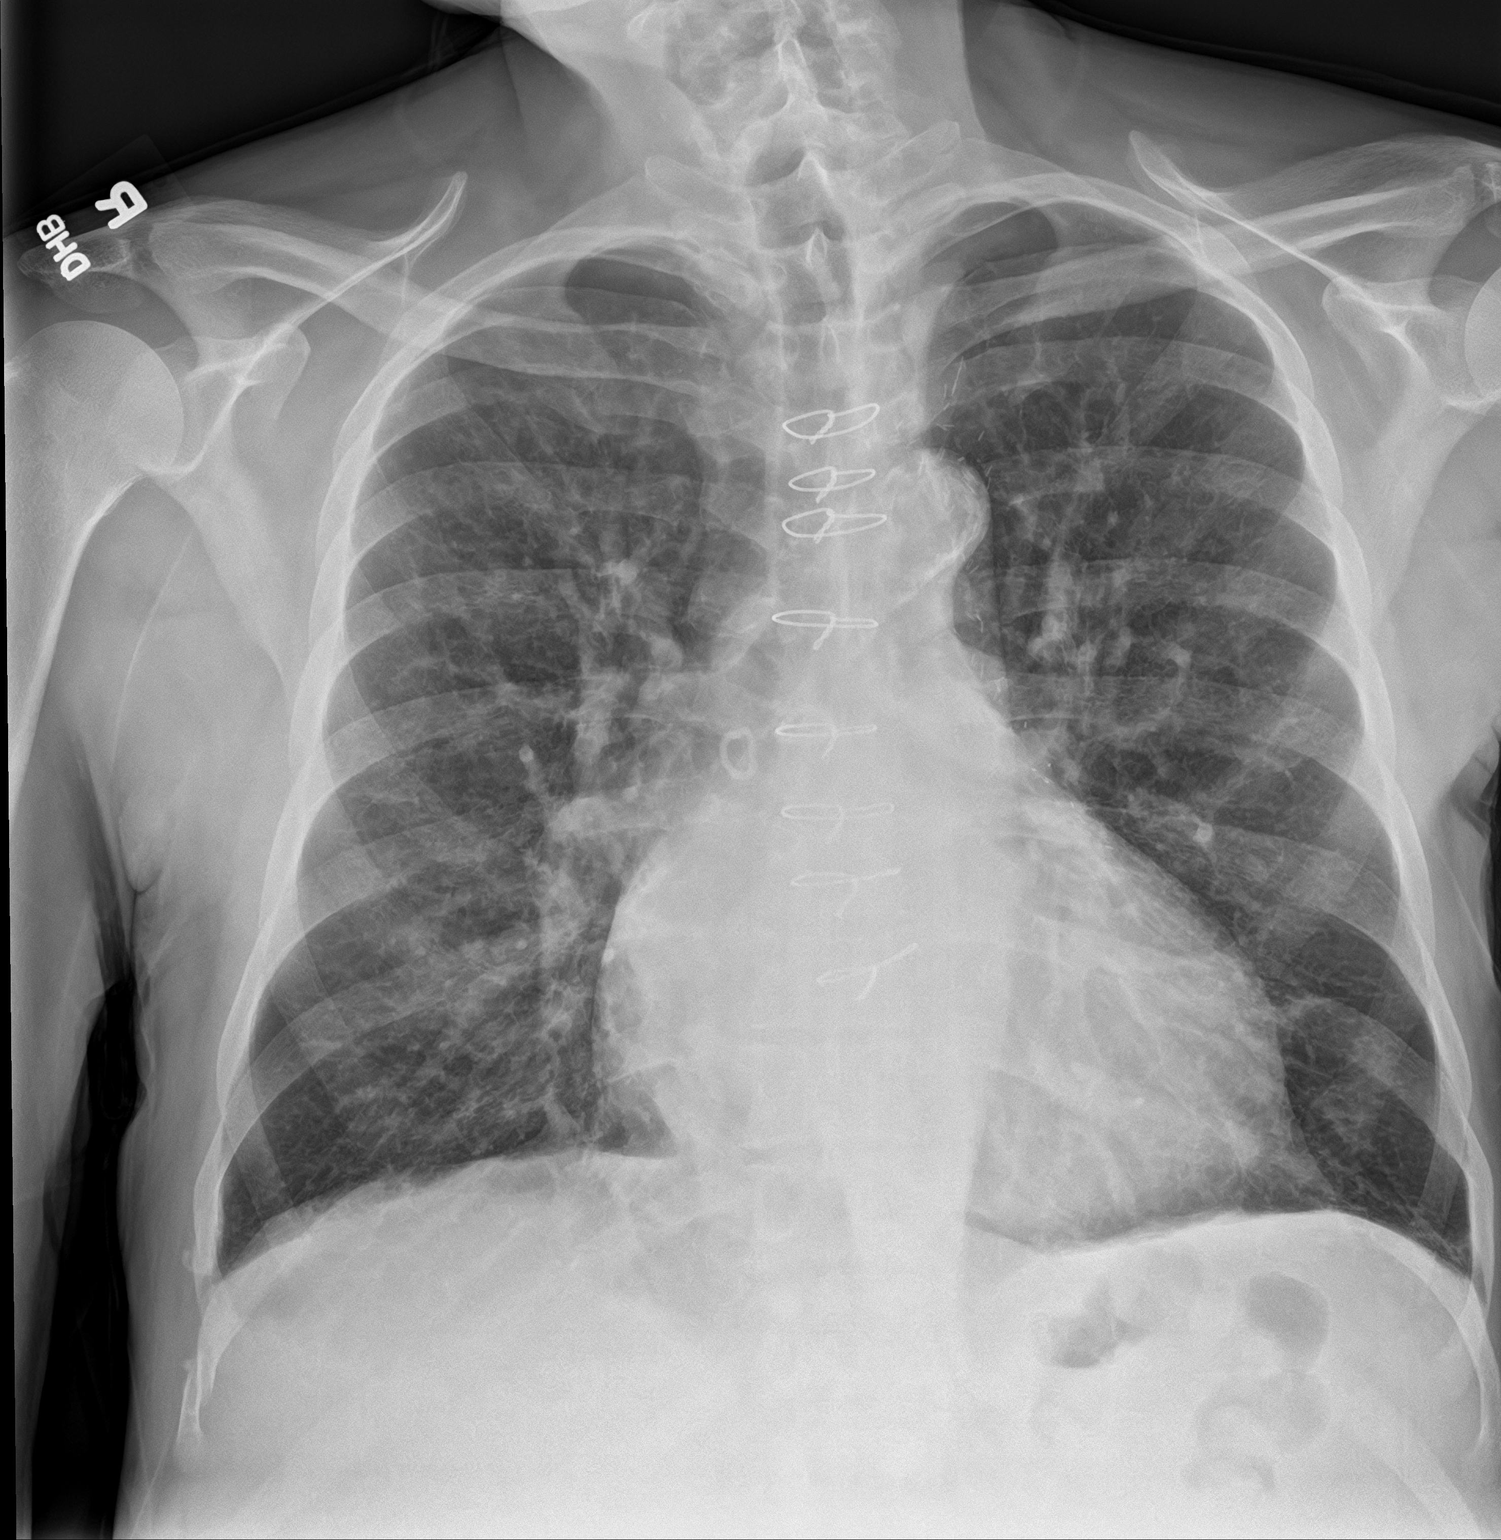

[chest lat]
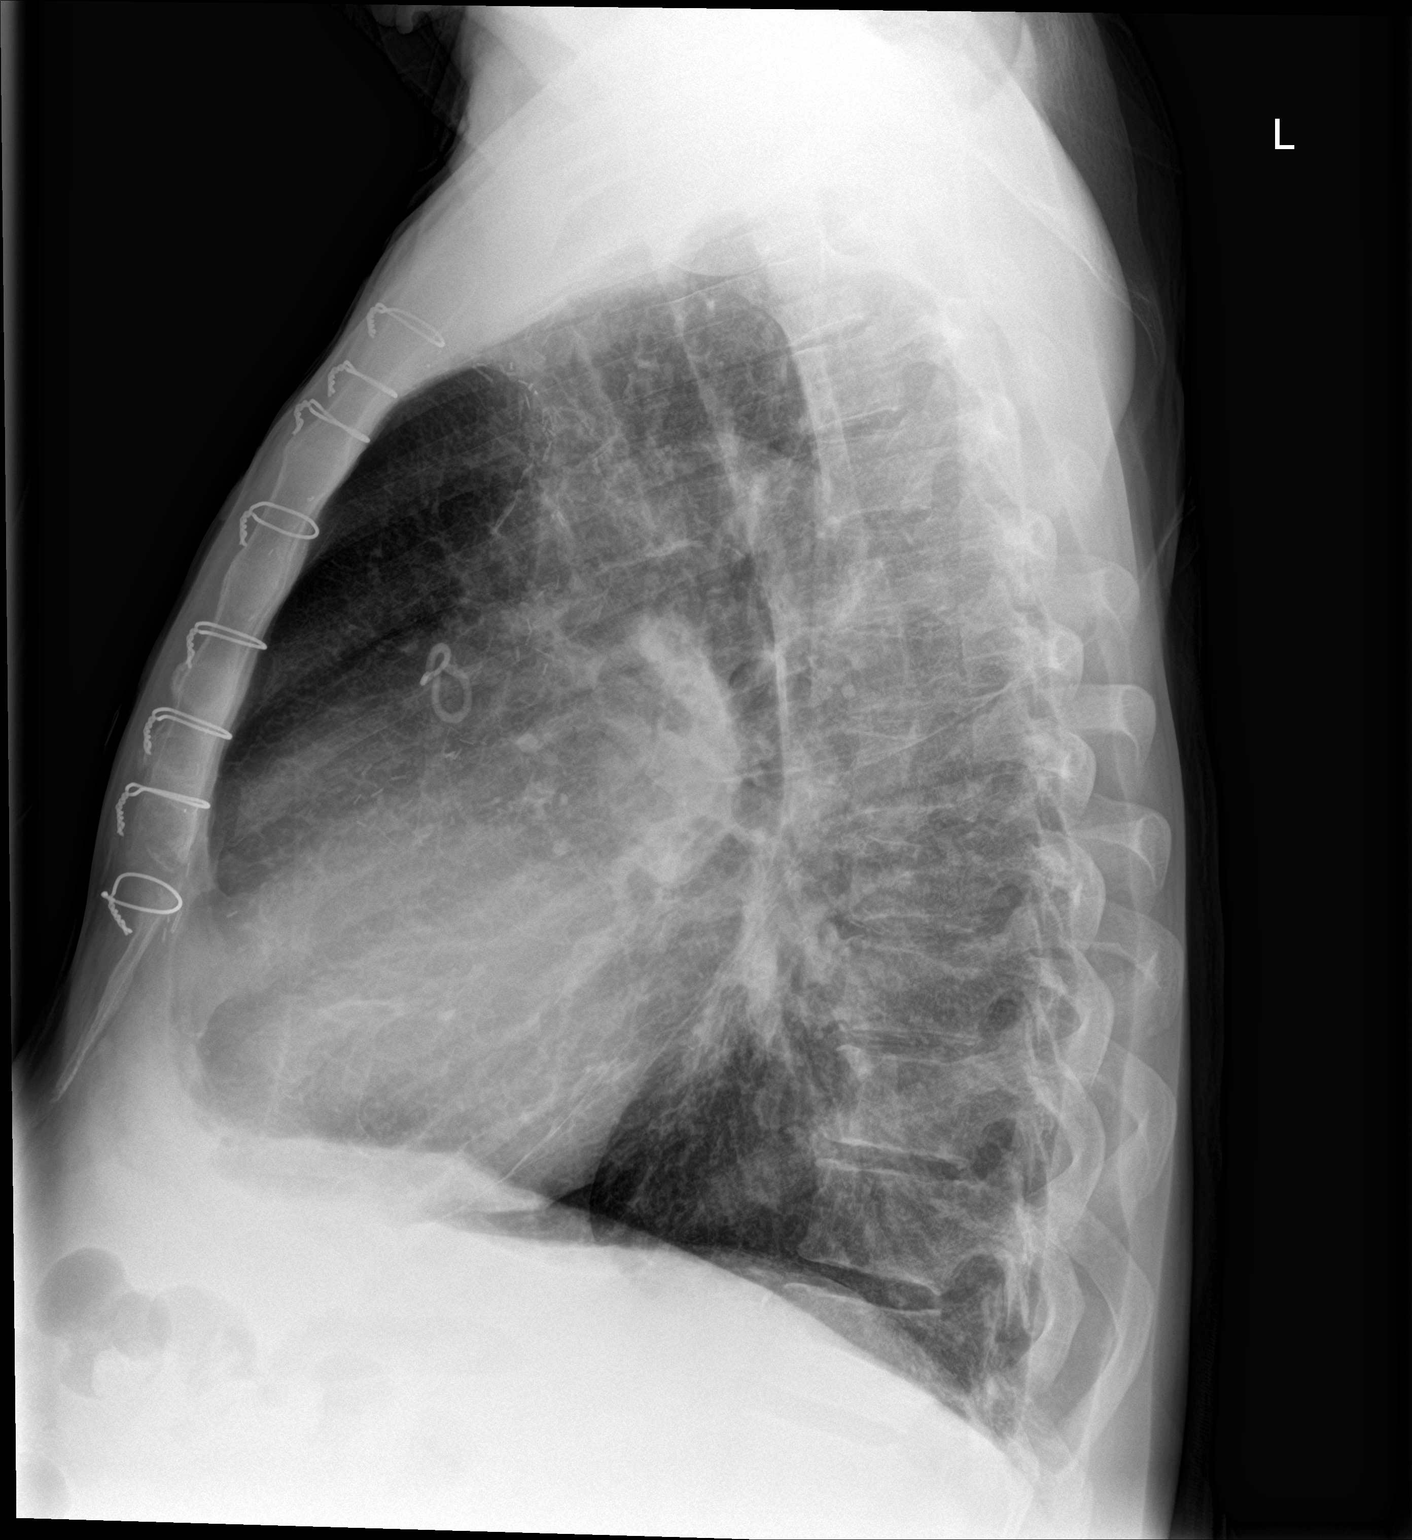

[2 of 2 positions shown; findings below may reference images not displayed]

FINDINGS: Cardiomegaly is stable. Atherosclerotic changes noted at the aortic
arch. Median sternotomy wires appear intact and stable in alignment.

Lungs are hyperexpanded. Coarse lung markings bilaterally are
compatible with chronic interstitial lung disease and/or chronic
bronchitic change. No confluent opacity to suggest a developing
pneumonia. No pleural effusion or pneumothorax seen. No evidence of
pulmonary edema.
IMPRESSION: 1. No active cardiopulmonary disease. No evidence of pneumonia or
pulmonary edema.
2. COPD. Associated chronic interstitial lung disease and/or chronic
bronchitic changes.
3. Stable cardiomegaly.  No evidence of active CHF.
4. Aortic atherosclerosis.

## 2019-10-13 IMAGING — CR DG CHEST 2V
1 series · 2 of 2 positions shown · non-contrast
Comparison: 03/08/2017

CLINICAL DATA: "To get dialysis"

EXAM:
CHEST  2 VIEW

[Series 1: dg chest 2 view · 0.14mm/px · 2 of 2 slices shown]
[im 1/2]
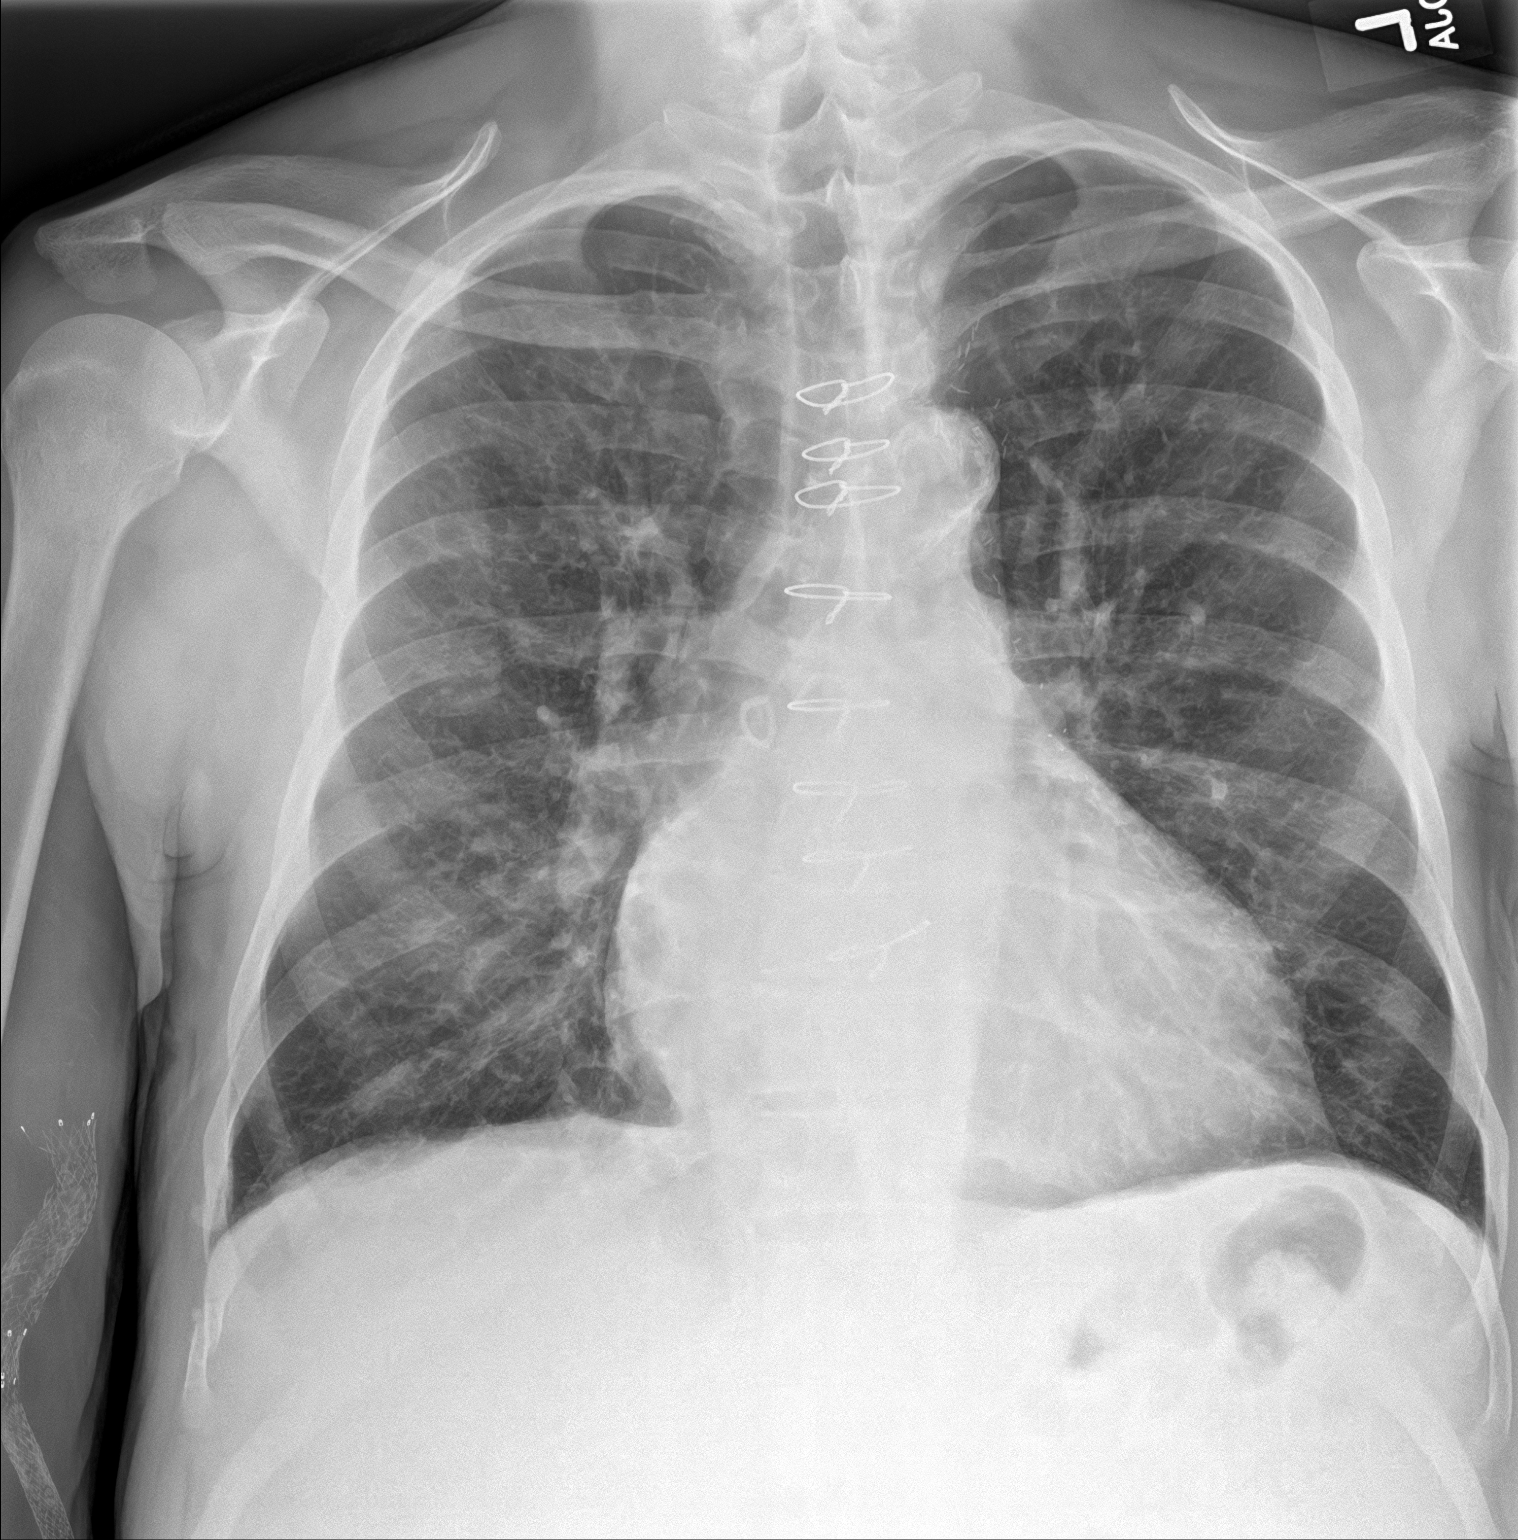
[im 2/2]
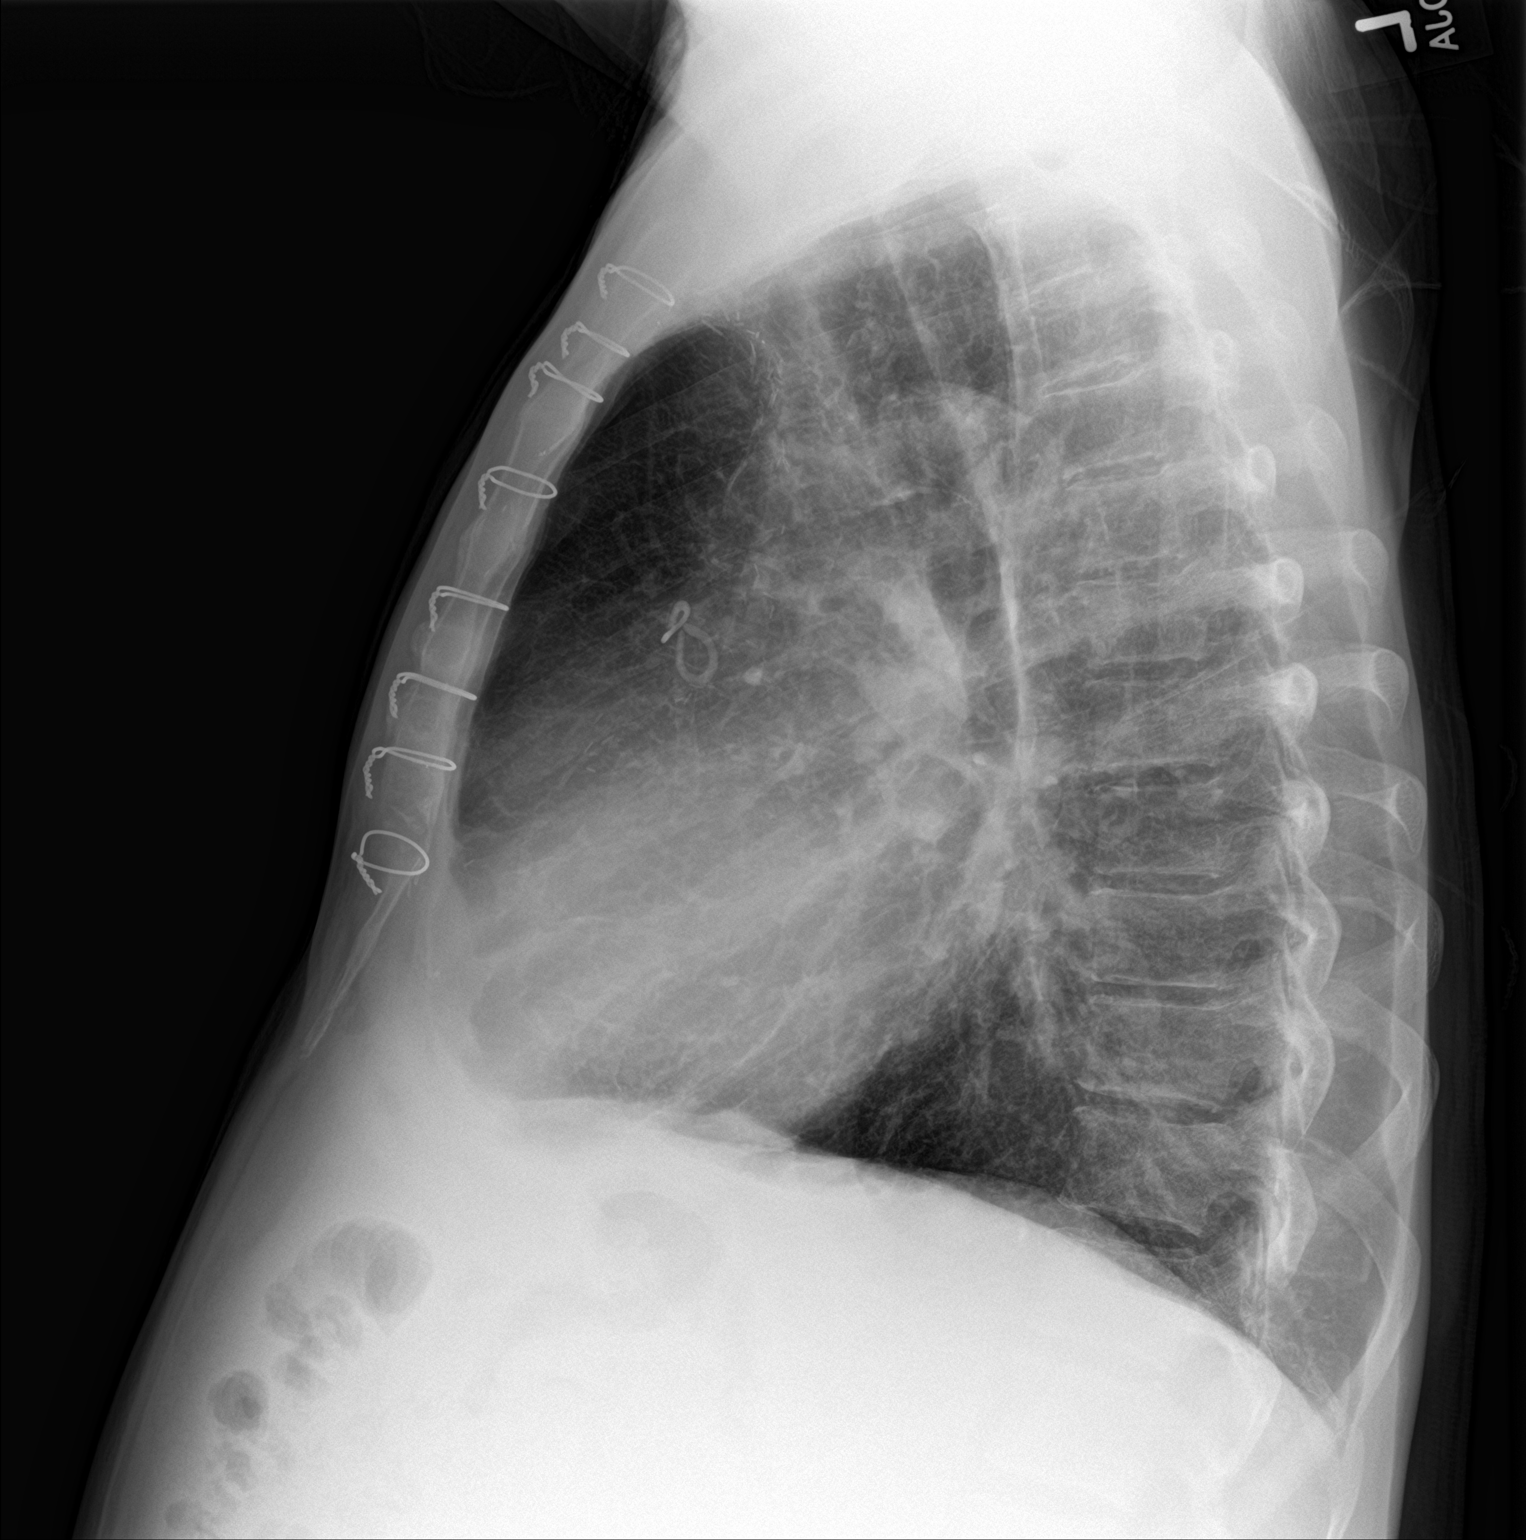

[2 of 2 positions shown; findings below may reference images not displayed]

FINDINGS: Lungs are hyperexpanded. The lungs are clear without focal
pneumonia, edema, pneumothorax or pleural effusion. Interstitial
markings are diffusely coarsened with chronic features. Patient is
status post CABG Cardiopericardial silhouette is at upper limits of
normal for size. The visualized bony structures of the thorax are
intact.
IMPRESSION: No active cardiopulmonary disease.

## 2019-10-16 IMAGING — CR DG CHEST 2V
1 series · 2 of 2 positions shown · non-contrast
Comparison: 03/11/2017, 03/08/2017

CLINICAL DATA: 56-year-old male with a history of shortness of
breath for 2-3 days

EXAM:
CHEST  2 VIEW

[Series 1: dg chest 2 view · 0.14mm/px · 2 of 2 slices shown]
[im 1/2]
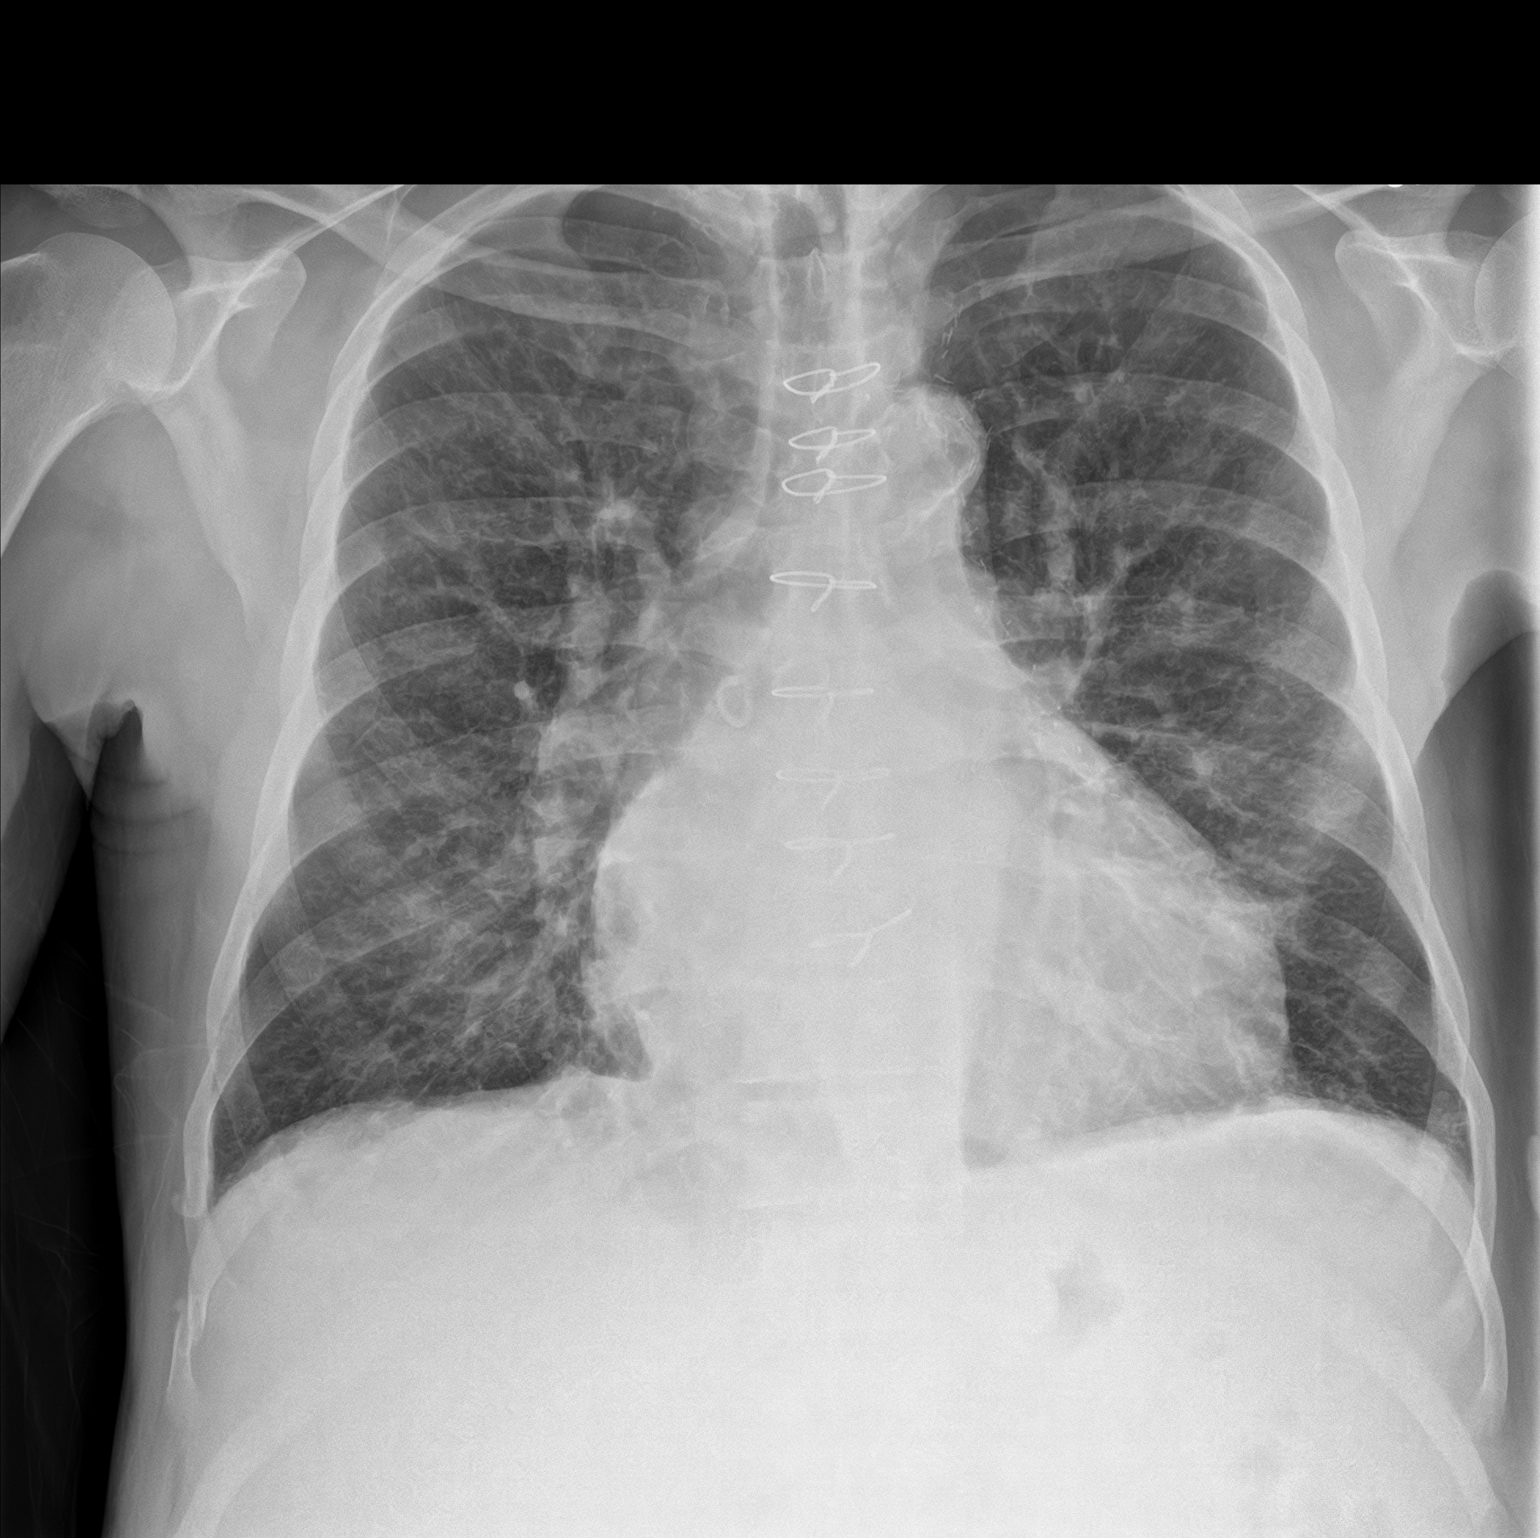
[im 2/2]
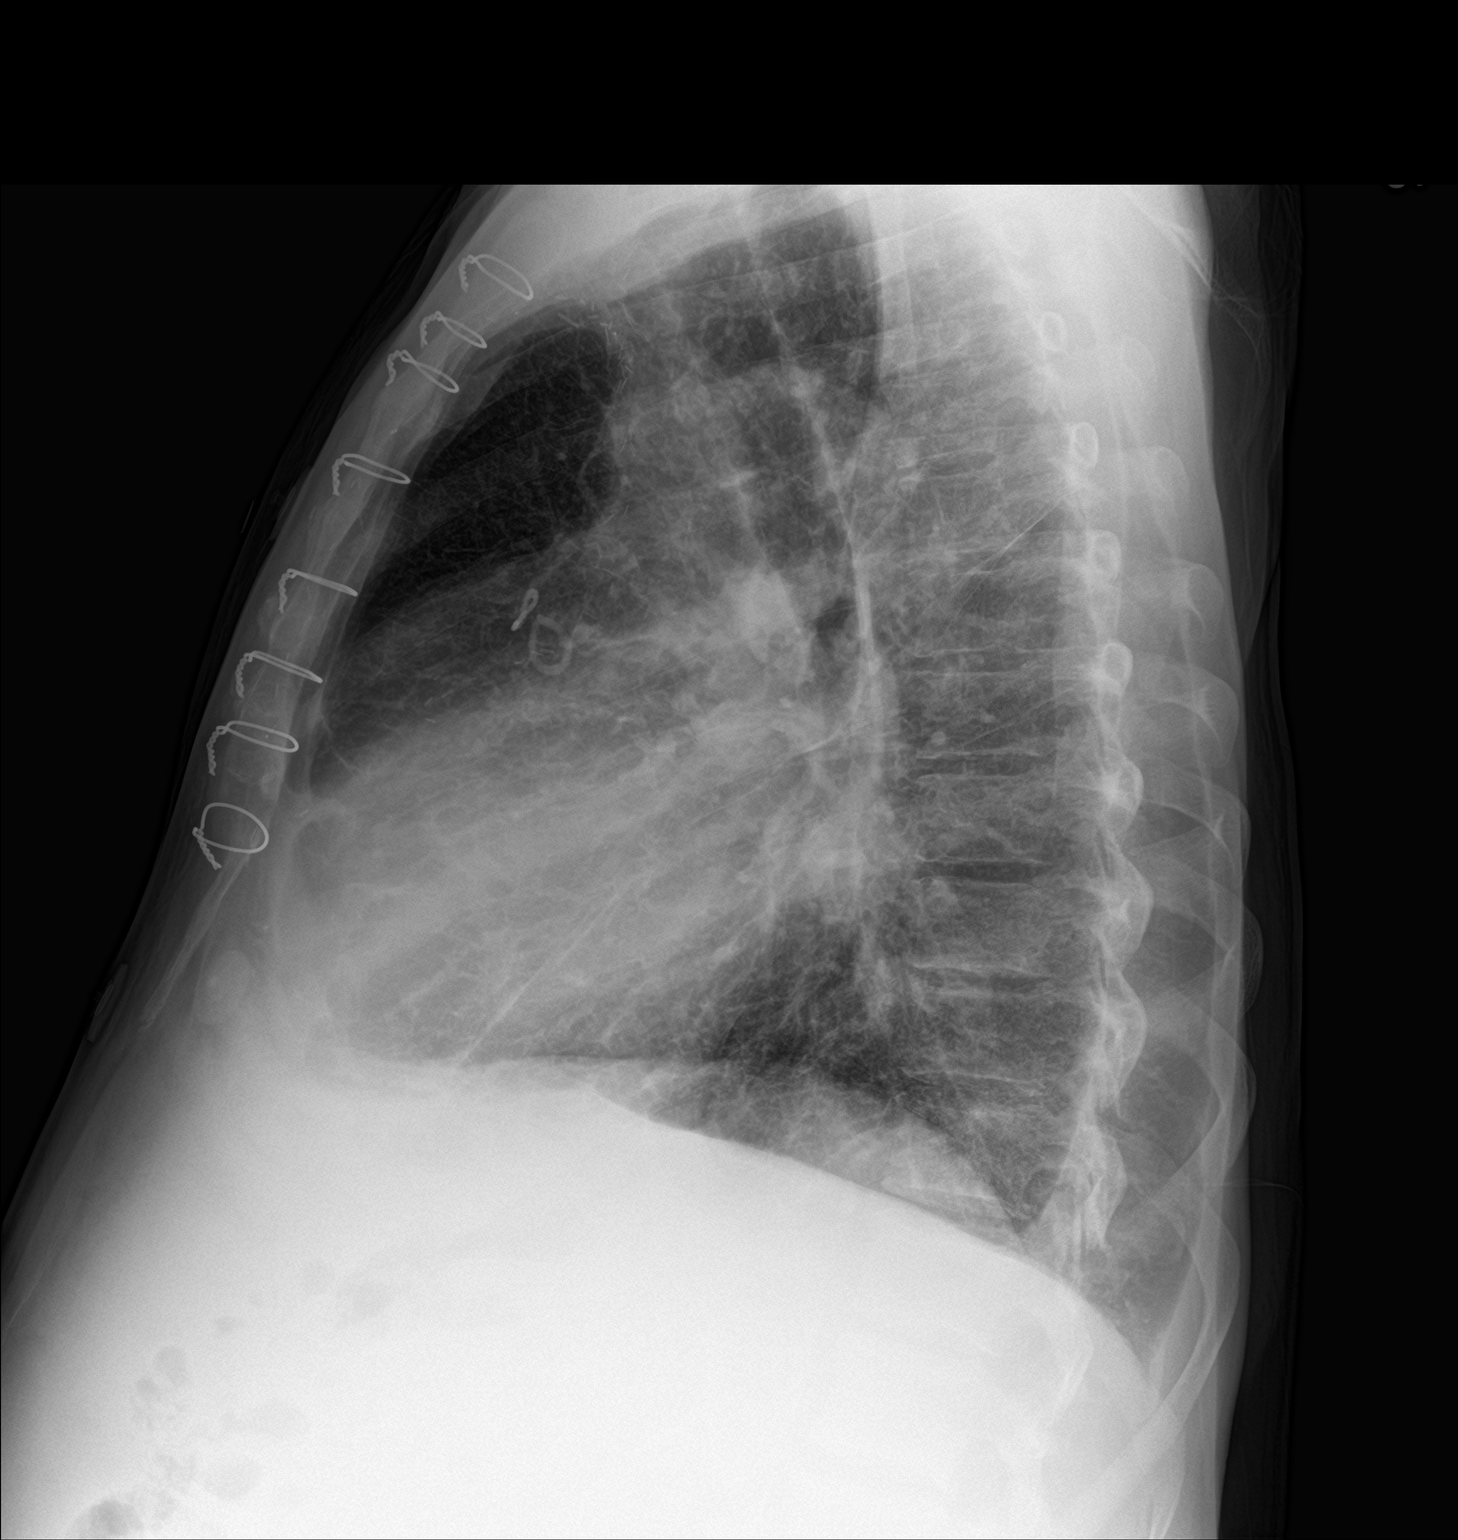

[2 of 2 positions shown; findings below may reference images not displayed]

FINDINGS: Cardiomediastinal silhouette unchanged with cardiomegaly. Surgical
changes of prior median sternotomy and CABG.

No evidence of central vascular congestion. Similar appearance of
coarsened interstitial markings of the bilateral lungs. No pleural
effusion. No confluent airspace disease. No pneumothorax.
IMPRESSION: Similar appearance of chronic lung changes without evidence of
superimposed acute cardiopulmonary disease.

Cardiomegaly with surgical changes of prior median sternotomy and
CABG.

## 2019-10-17 IMAGING — US US PARACENTESIS
1 series · 3 of 3 positions shown · non-contrast
Comparison: none

INDICATION: Recurrent ascites.

[Series 1: us paracentesis · 0.26mm/px · 3 of 3 slices shown]
[im 1/3]
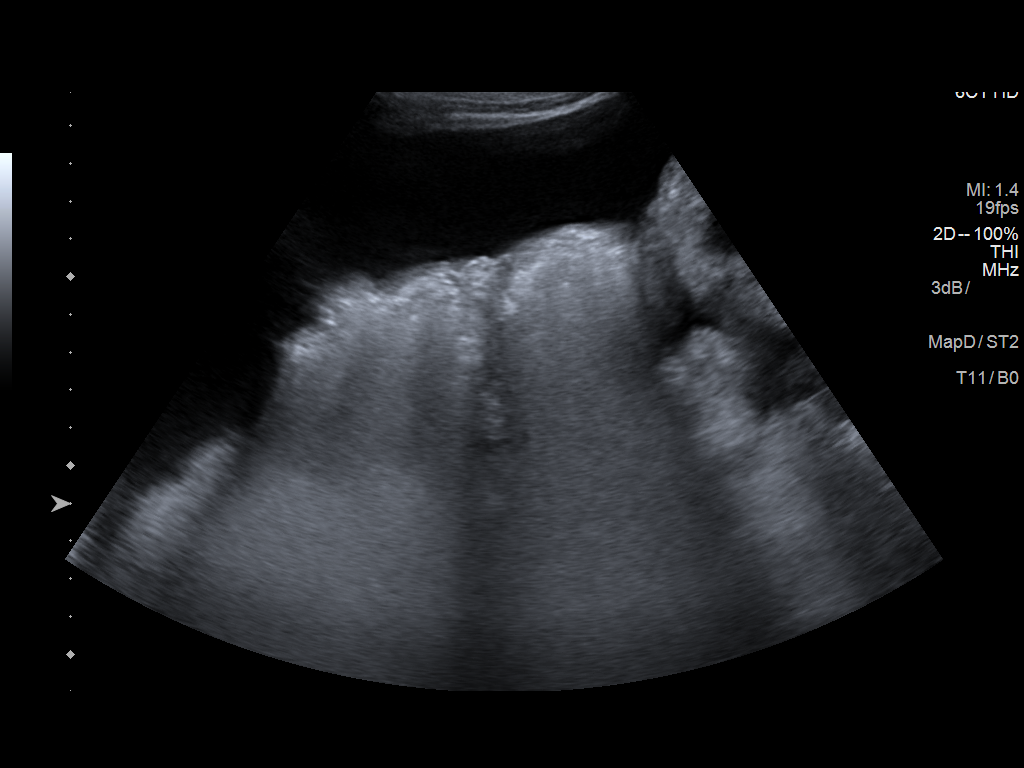
[im 2/3]
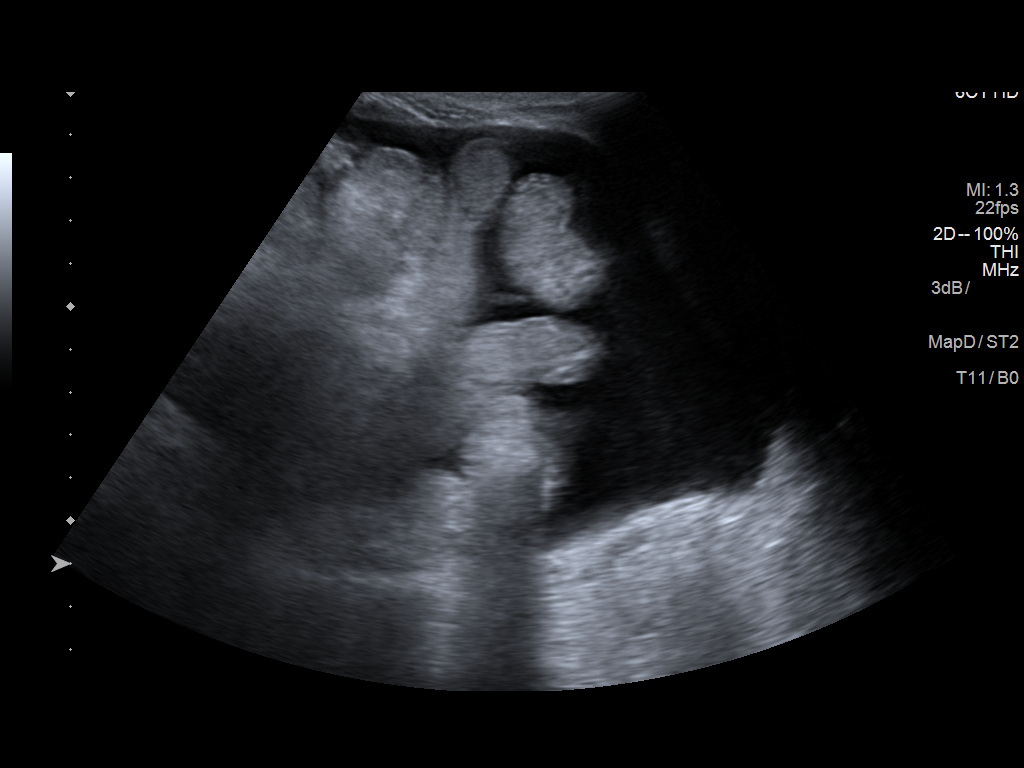
[im 3/3]
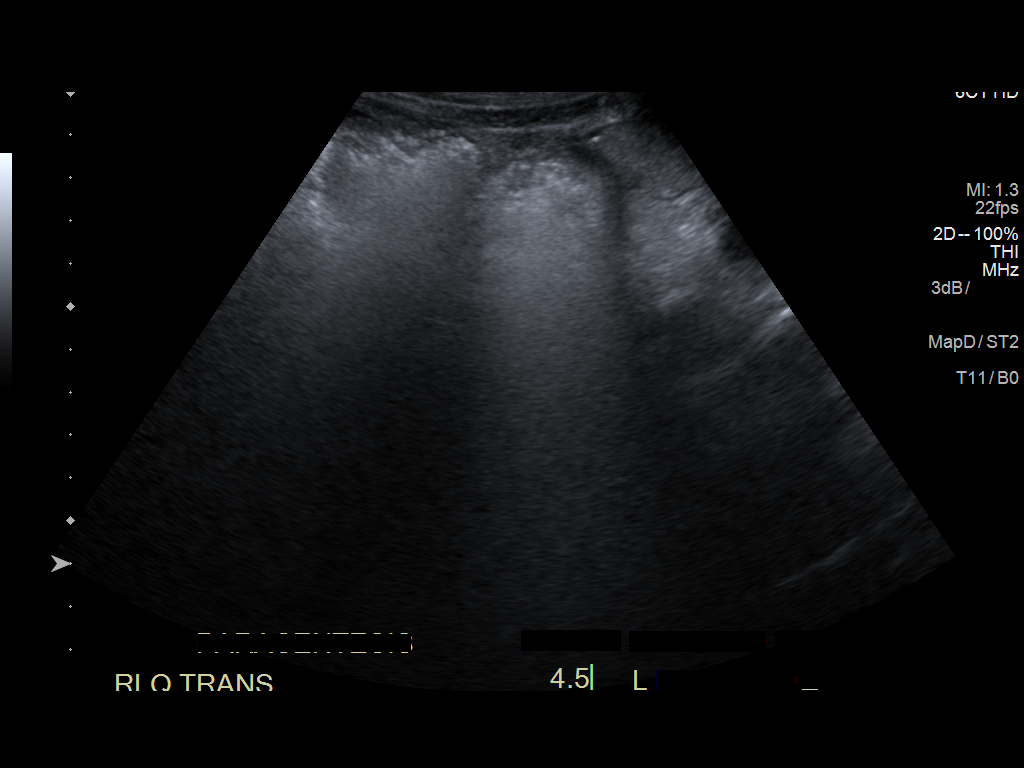

[3 of 3 positions shown; findings below may reference images not displayed]

EXAM:
ULTRASOUND GUIDED PARACENTESIS

MEDICATIONS:
None.

COMPLICATIONS:
None immediate.

PROCEDURE:
Informed written consent was obtained from the patient after a
discussion of the risks, benefits and alternatives to treatment. A
timeout was performed prior to the initiation of the procedure.

Initial ultrasound scanning demonstrates a large amount of ascites
within the right lower abdominal quadrant. The right lower abdomen
was prepped and draped in the usual sterile fashion. 1% lidocaine
was used for local anesthesia.

Following this, a 6 Fr Safe-T-Centesis catheter was introduced. An
ultrasound image was saved for documentation purposes. The
paracentesis was performed. The catheter was removed and a dressing
was applied. The patient tolerated the procedure well without
immediate post procedural complication.
FINDINGS: A total of approximately 4.5 L of amber colored fluid was removed.
IMPRESSION: Successful ultrasound-guided paracentesis yielding 4.5 liters of
peritoneal fluid.

## 2019-11-03 IMAGING — DX DG CHEST 1V
1 series · 1 of 1 positions shown · non-contrast
Comparison: 03/14/2017

CLINICAL DATA: Chest pain

EXAM:
CHEST 1 VIEW

[chest ap]
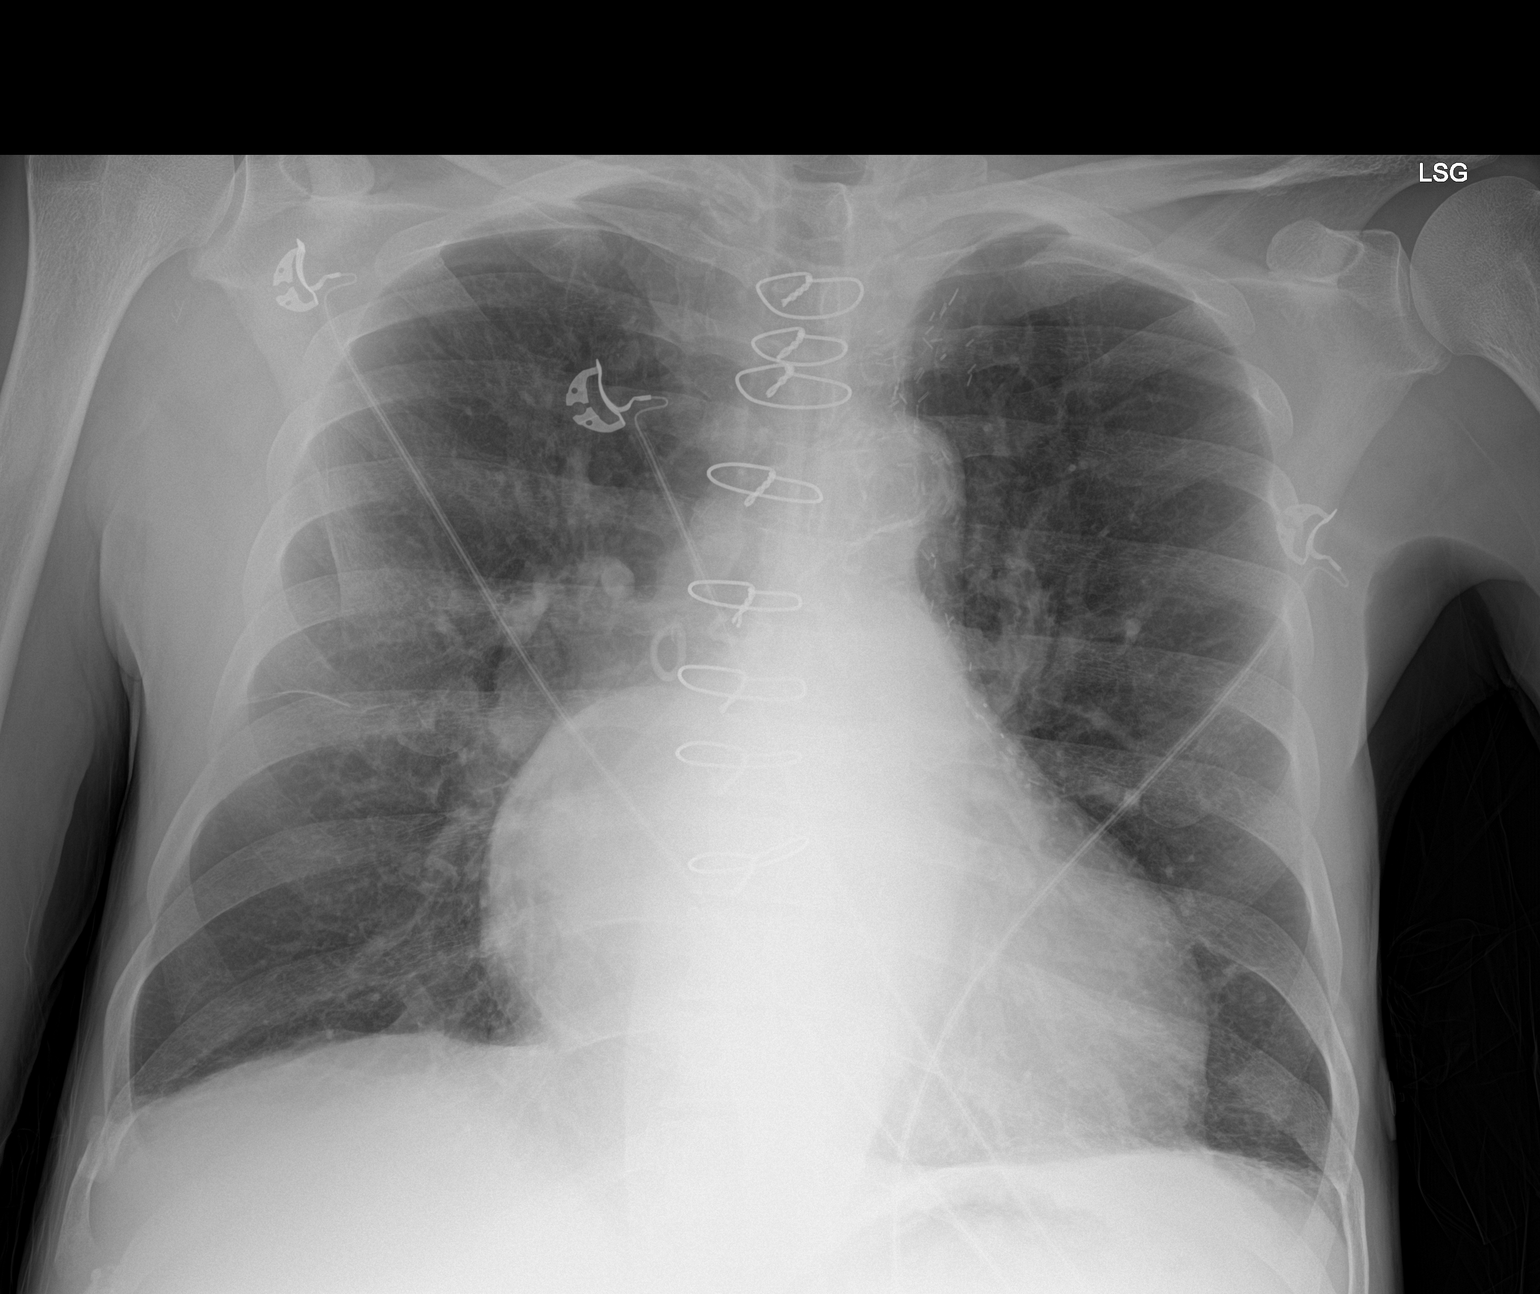

[1 of 1 positions shown; findings below may reference images not displayed]

FINDINGS: Chronic mild cardiomegaly. Status post CABG. Stable aortic and hilar
contours. There is large lung volumes and chronic interstitial
coarsening. There is no edema, consolidation, effusion, or
pneumothorax.
IMPRESSION: 1. No acute finding when compared to prior.
2. Chronic lung disease (COPD) and cardiomegaly.

## 2019-11-12 IMAGING — CR DG CHEST 2V
2 series · 2 of 2 positions shown · non-contrast
Comparison: 04/01/2017

CLINICAL DATA: CHF.  End-stage renal disease.

EXAM:
CHEST  2 VIEW

[chest pa]
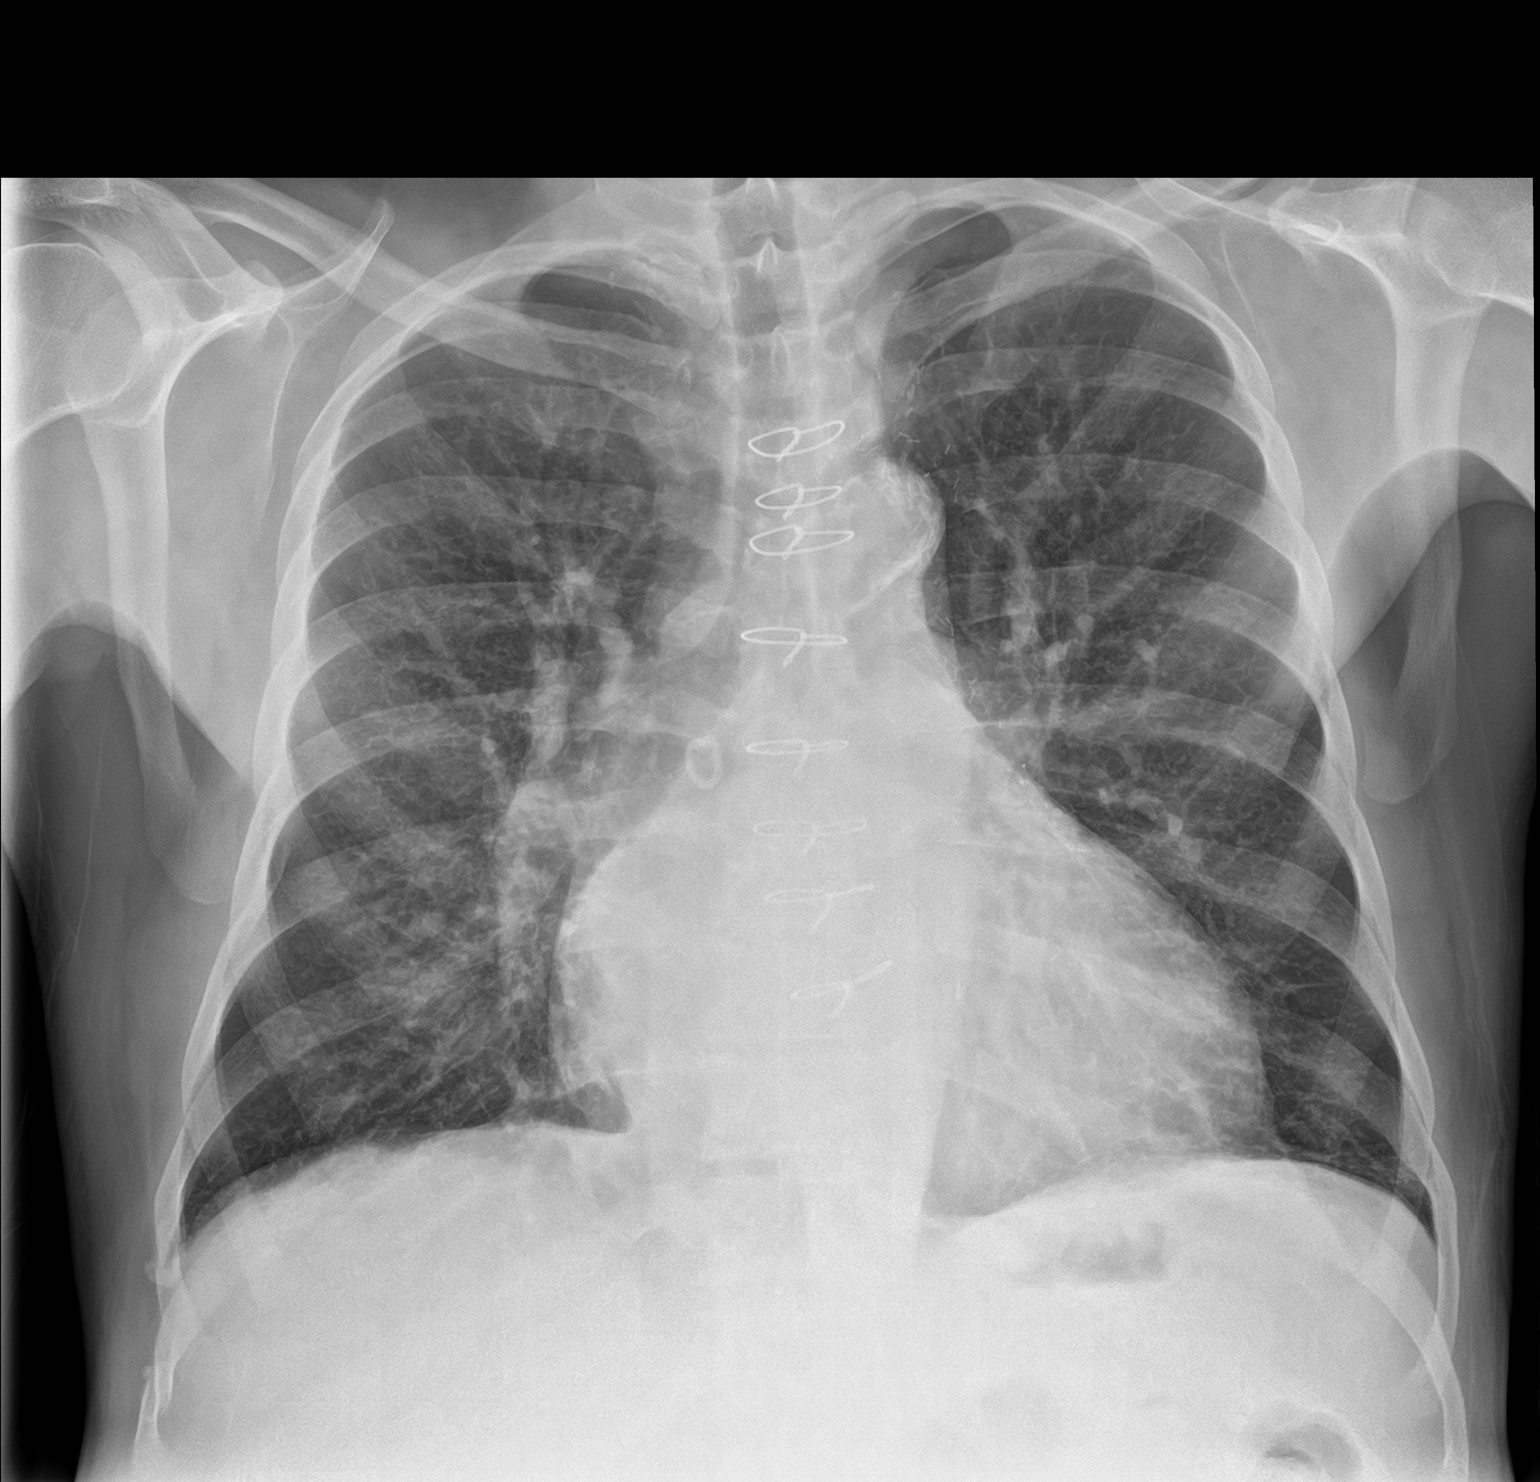

[chest lat]
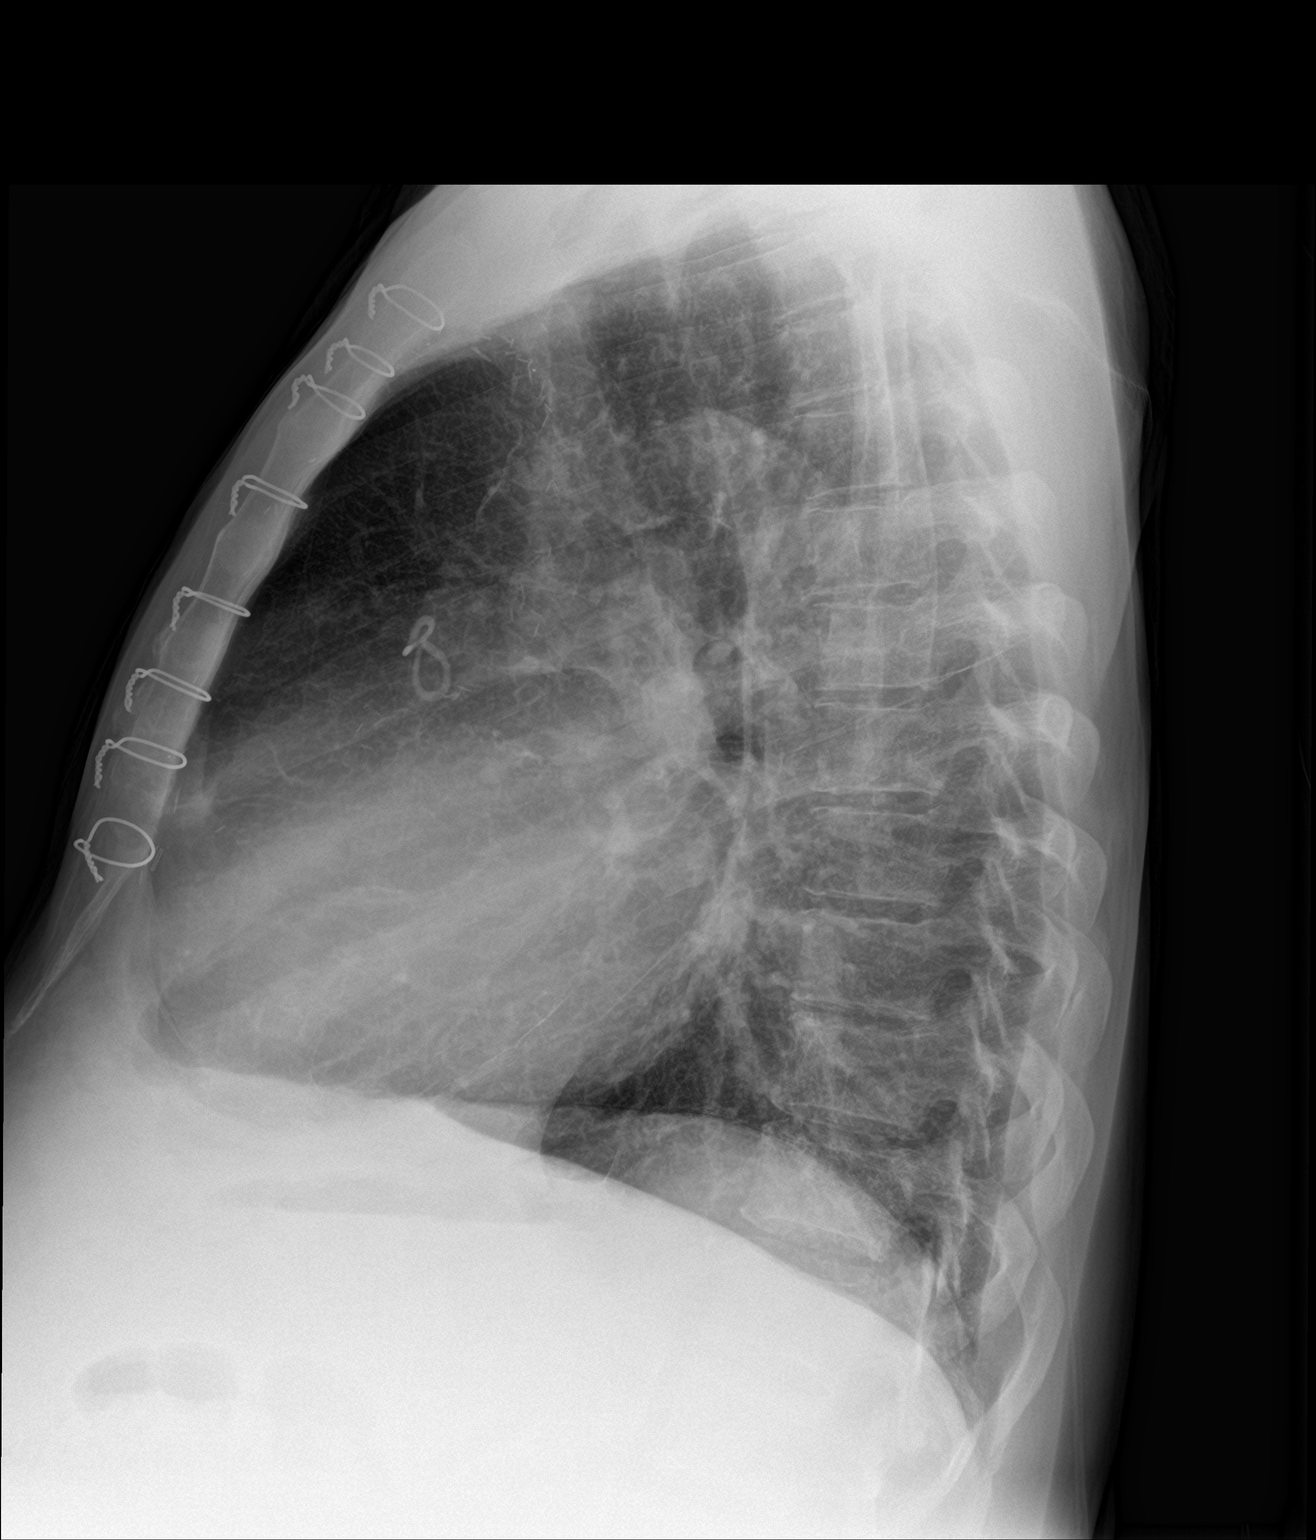

[2 of 2 positions shown; findings below may reference images not displayed]

FINDINGS: Stable surgical changes from bypass surgery. The heart is borderline
enlarged but stable. Stable tortuosity and calcification of the
thoracic aorta. Mild vascular congestion without overt pulmonary
edema. No pleural effusions. No focal infiltrates. Healing right
lower rib fractures are noted.
IMPRESSION: Mild cardiac enlargement and vascular congestion without overt
pulmonary edema or pleural effusion.

## 2019-11-12 IMAGING — US US PARACENTESIS
1 series · 2 of 2 positions shown · non-contrast
Comparison: none

INDICATION: Patient with a current abdominal distention, ascites. Request is
made for therapeutic paracentesis.

[Series 1: us paracentesis · 0.26mm/px · 2 of 2 slices shown]
[im 1/2]
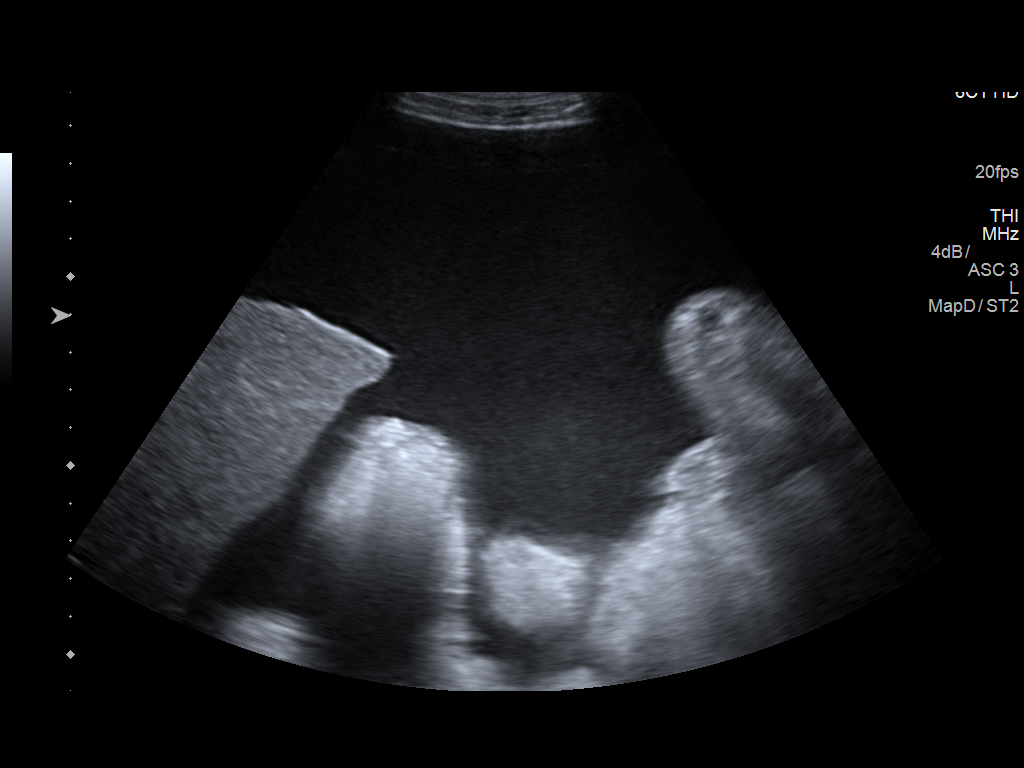
[im 2/2]
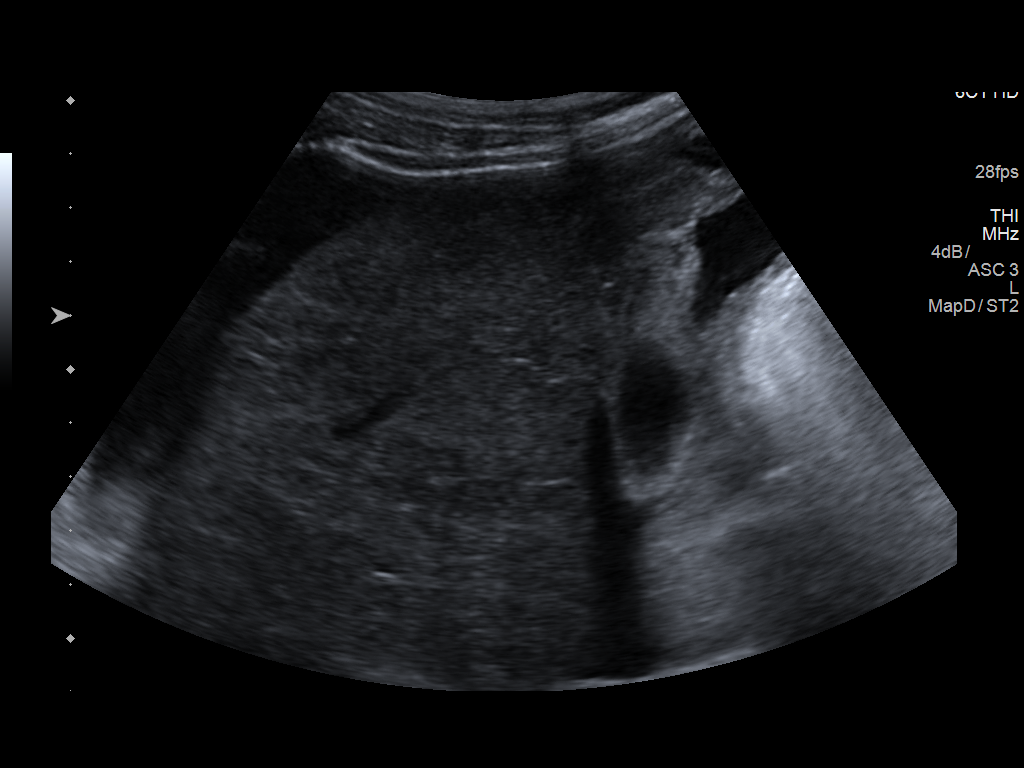

[2 of 2 positions shown; findings below may reference images not displayed]

EXAM:
ULTRASOUND GUIDED THERAPEUTIC PARACENTESIS

MEDICATIONS:
10 mL 1% lidocaine

COMPLICATIONS:
None immediate.

PROCEDURE:
Informed written consent was obtained from the patient after a
discussion of the risks, benefits and alternatives to treatment. A
timeout was performed prior to the initiation of the procedure.

Initial ultrasound scanning demonstrates a large amount of ascites
within the right lateral abdomen. The right lateral abdomen was
prepped and draped in the usual sterile fashion. 1% lidocaine was
used for local anesthesia.

Following this, a 19 gauge, 7-cm, Yueh catheter was introduced. An
ultrasound image was saved for documentation purposes. The
paracentesis was performed. The catheter was removed and a dressing
was applied. The patient tolerated the procedure well without
immediate post procedural complication.
FINDINGS: A total of approximately 6.6 L of amber fluid was removed.
IMPRESSION: Successful ultrasound-guided therapeutic paracentesis yielding
liters of peritoneal fluid.

## 2019-11-14 IMAGING — DX DG CHEST 1V
1 series · 1 of 1 positions shown · non-contrast
Comparison: Single-view of the chest 04/01/2017. PA and lateral
chest 04/10/2016.

CLINICAL DATA: Shortness of breath today.  Dialysis patient.

EXAM:
CHEST 1 VIEW

[chest ap]
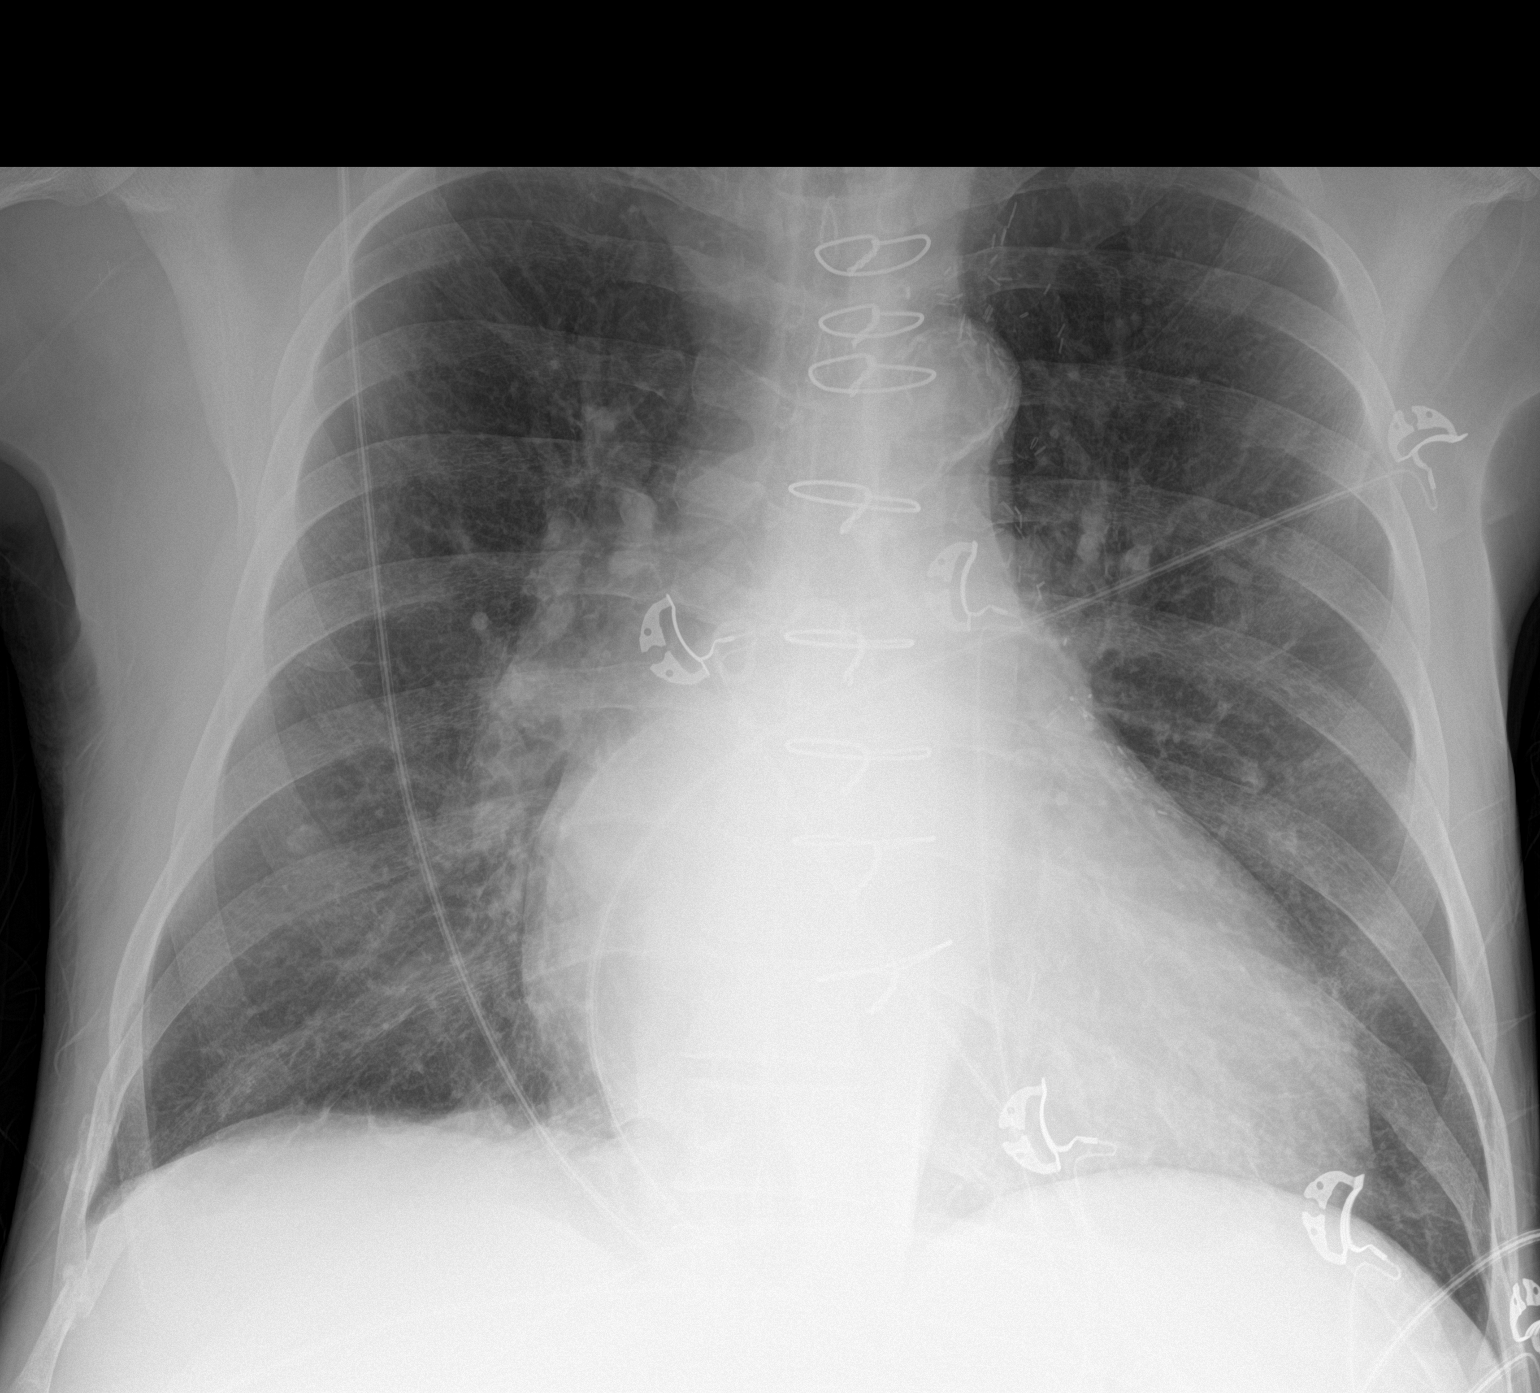

[1 of 1 positions shown; findings below may reference images not displayed]

FINDINGS: There is cardiomegaly and vascular congestion. The lungs are
emphysematous. No consolidative process, pneumothorax or pleural
fluid. The patient is status post CABG. Remote lower right rib
fractures are noted.
IMPRESSION: No acute disease.

Cardiomegaly and emphysema.

## 2019-12-05 IMAGING — CR DG CHEST 2V
2 series · 3 of 3 positions shown · non-contrast
Comparison: 04/12/2017

CLINICAL DATA: Hematemesis

EXAM:
CHEST  2 VIEW

[chest lat]
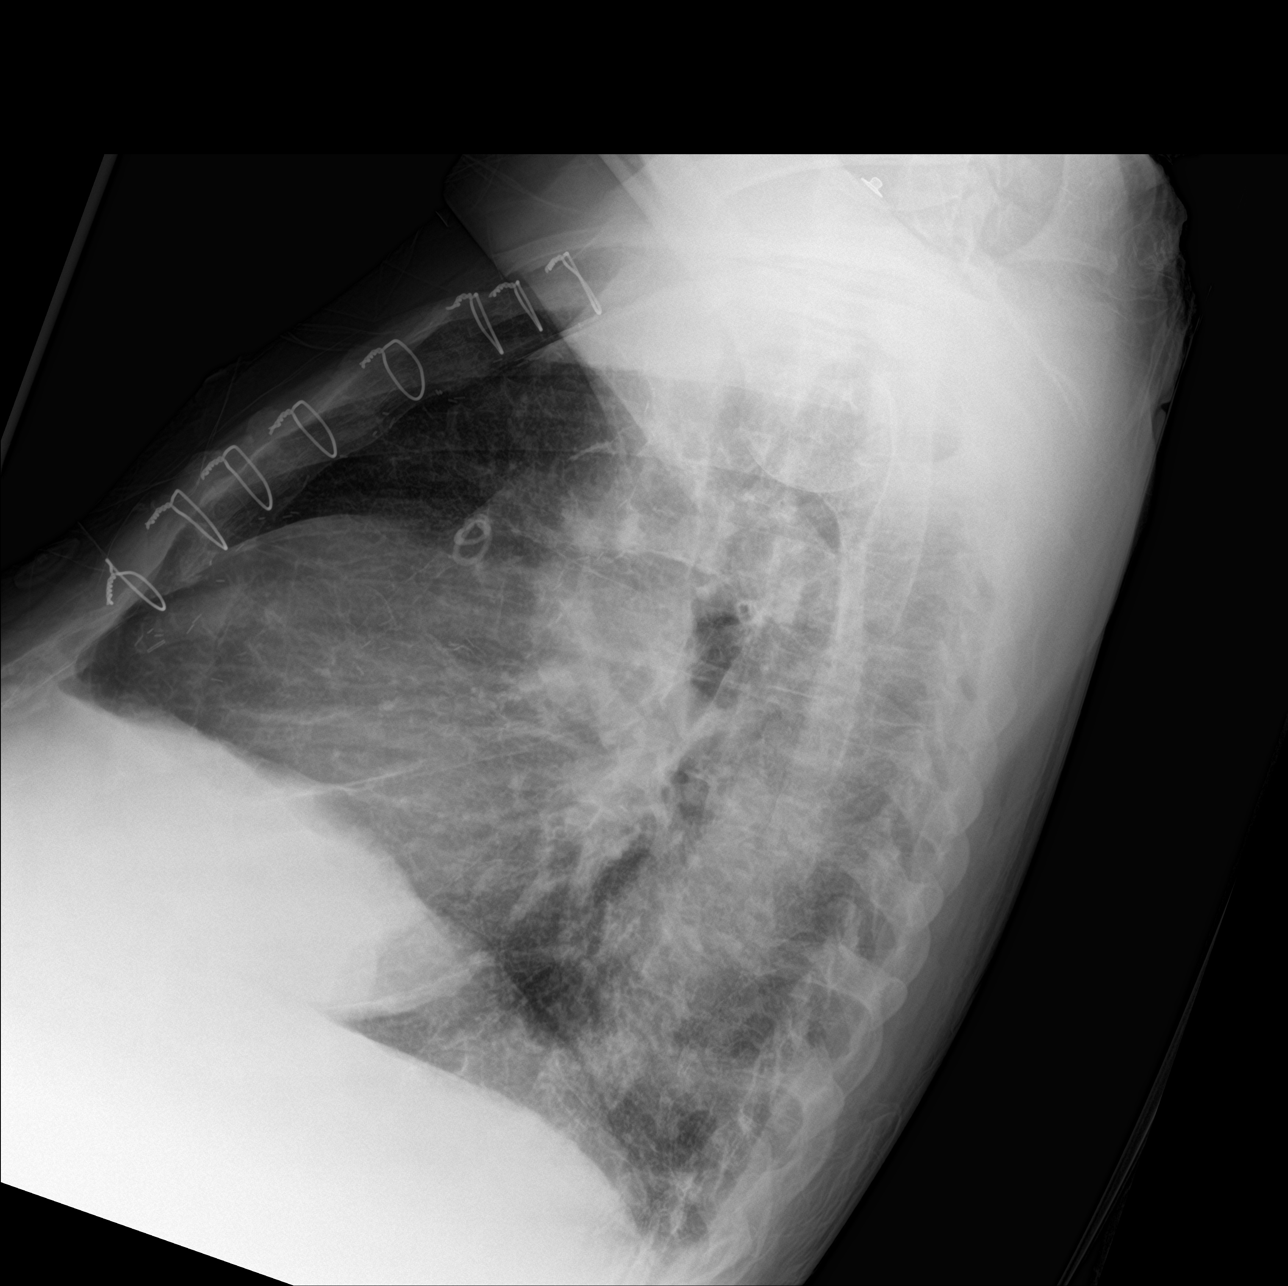

[Series 3: chest ap · 0.14mm/px · 2 of 2 slices shown]
[im 1/2]
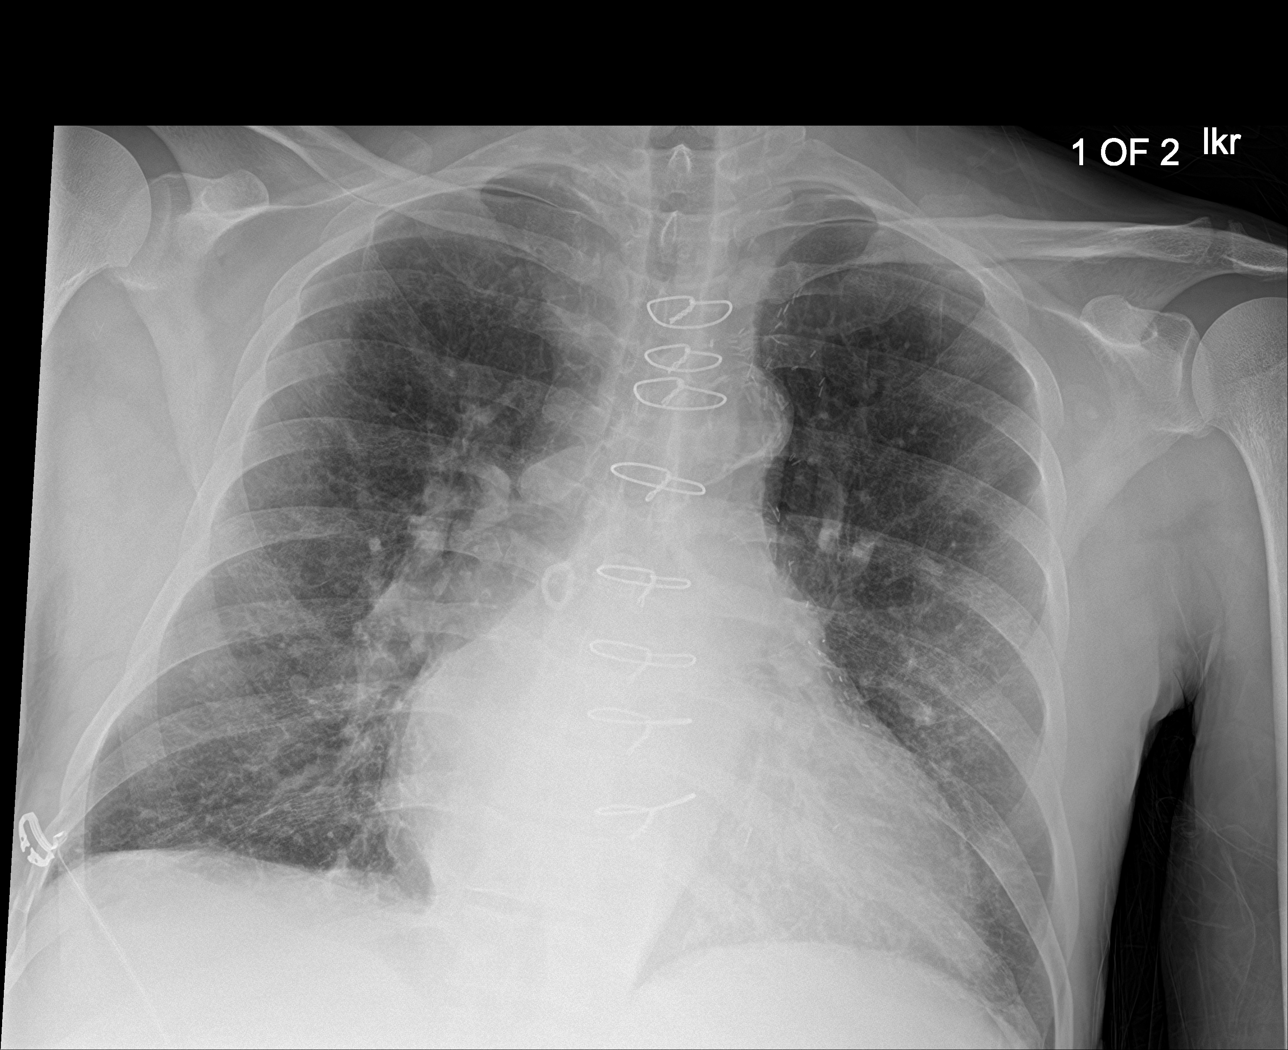
[im 2/2]
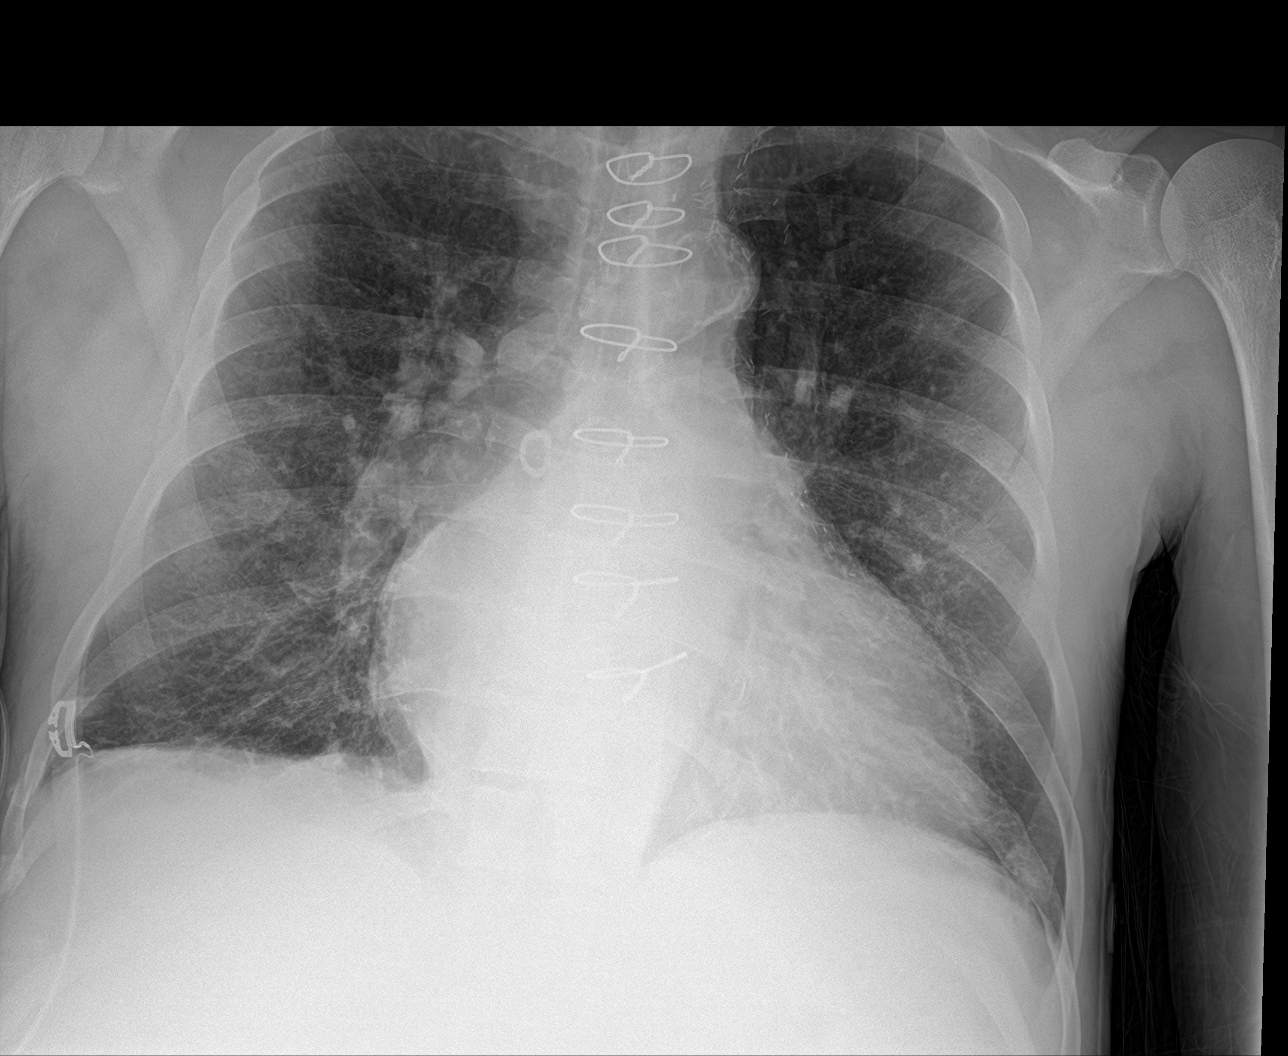

[3 of 3 positions shown; findings below may reference images not displayed]

FINDINGS: Cardiac shadow remains enlarged. Postsurgical changes are again
seen. The lungs are well aerated bilaterally. No focal infiltrate or
sizable effusion is seen. No bony abnormality is noted.
IMPRESSION: No acute abnormality seen.

## 2019-12-11 IMAGING — CT CT HEAD W/O CM
3 of 4 series · 14 of 47 positions shown, 16 images · non-contrast
Comparison: 07/09/2013 CT head

CLINICAL DATA: 57 y/o M; episode of altered mental status while
eating dinner.

EXAM:
CT HEAD WITHOUT CONTRAST
TECHNIQUE: Contiguous axial images were obtained from the base of the skull
through the vertex without intravenous contrast.

[Series 2: head wo · axial · 0.47mm/px · z∈[-116,+4]mm · 8 of 29 slices shown, 10 images]
[im 3/29  brain]
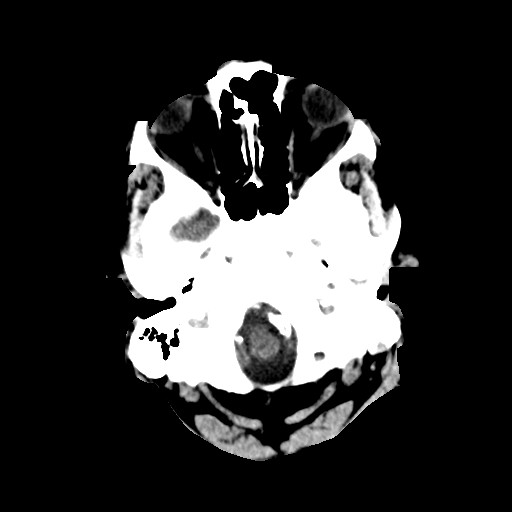
[im 3/29  bone]
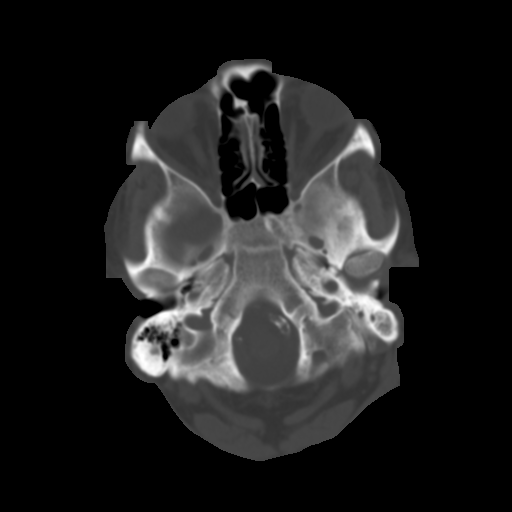
[im 7/29  brain]
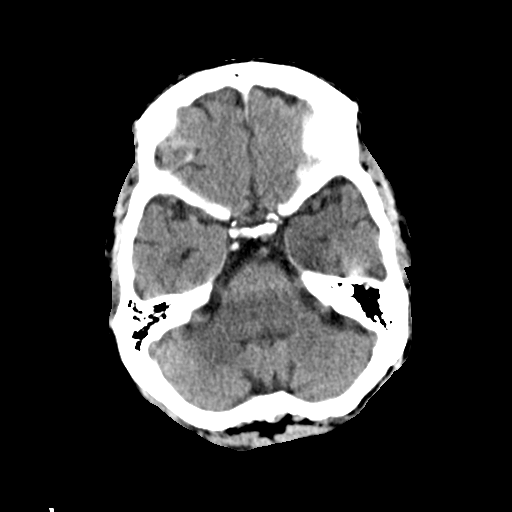
[im 11/29  brain]
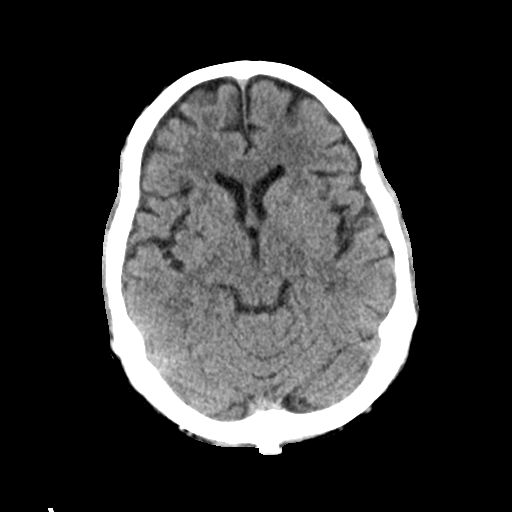
[im 13/29  brain]
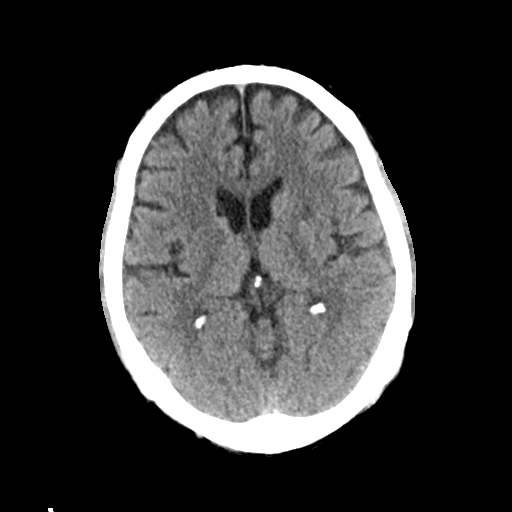
[im 17/29  brain]
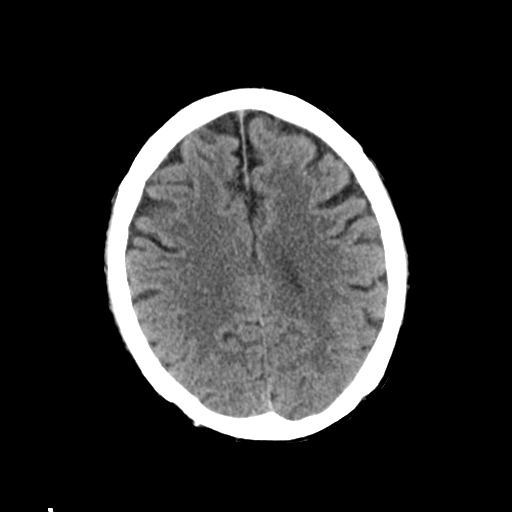
[im 17/29  bone]
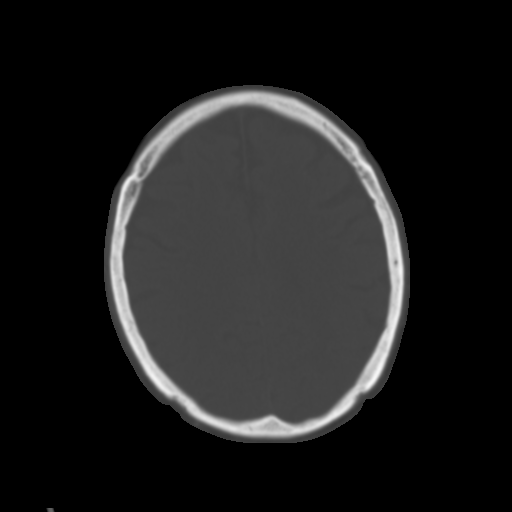
[im 19/29  brain]
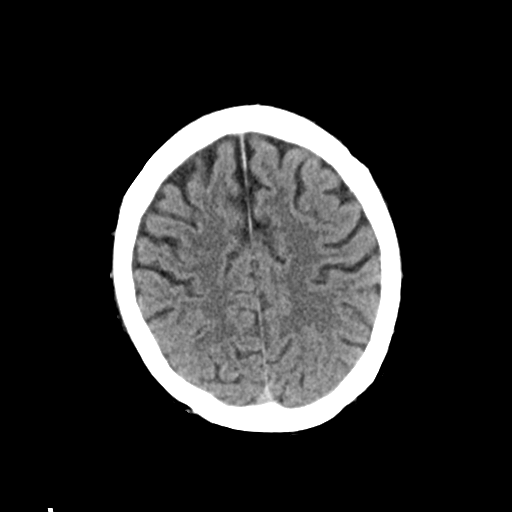
[im 23/29  brain]
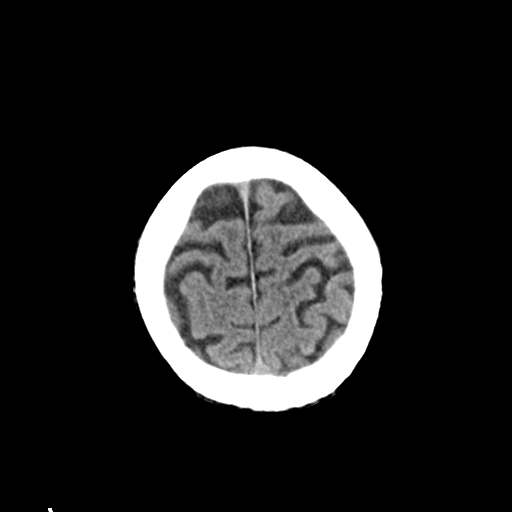
[im 27/29  brain]
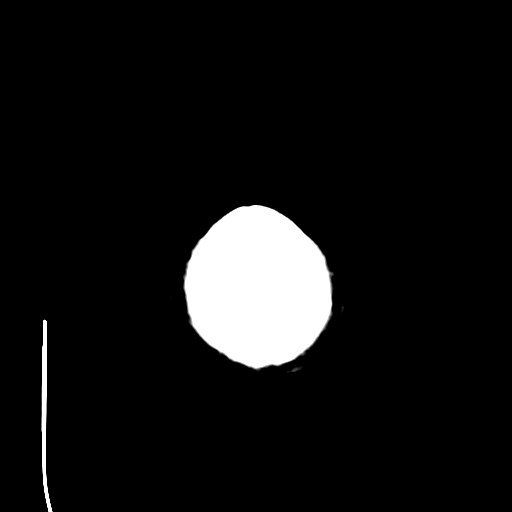

[Series 4: coronal soft tissue · coronal · 0.28mm/px · 3 of 62 slices shown]
[im 21/62  brain]
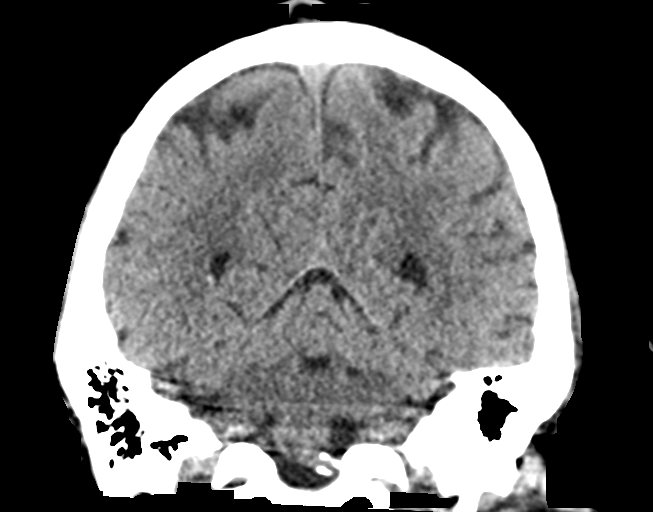
[im 28/62  brain]
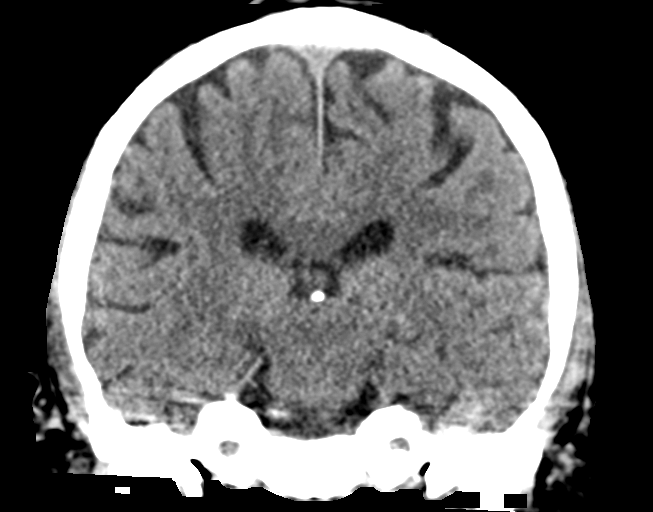
[im 34/62  brain]
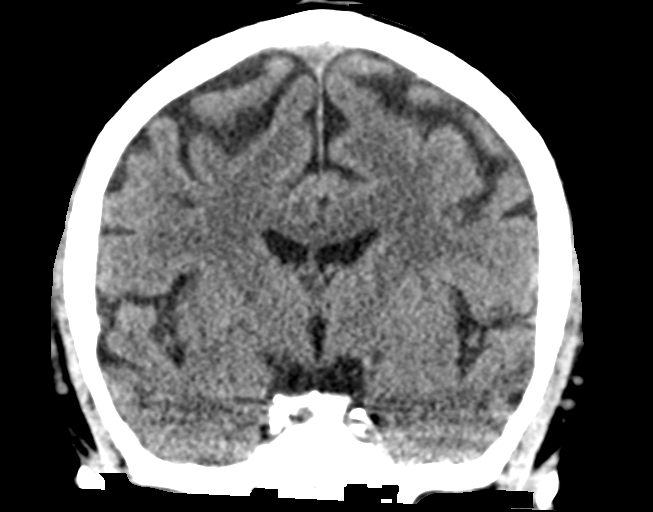

[Series 5: sagittal soft tissue · sagittal · 0.28mm/px · 3 of 55 slices shown]
[im 19/55  brain]
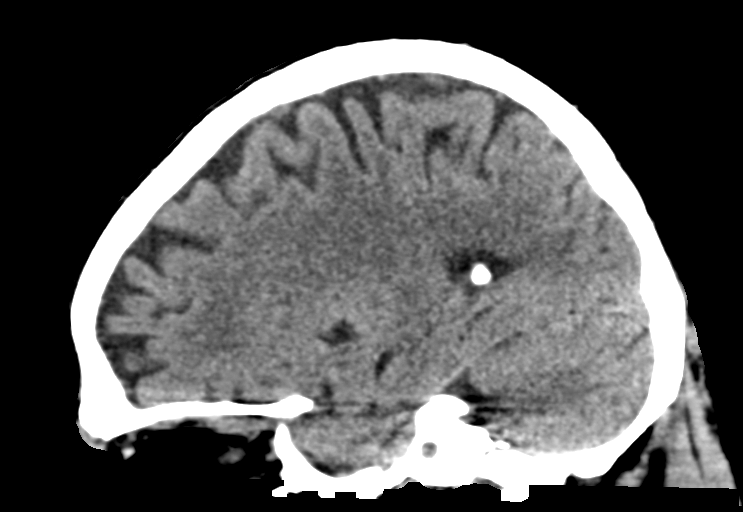
[im 28/55  brain]
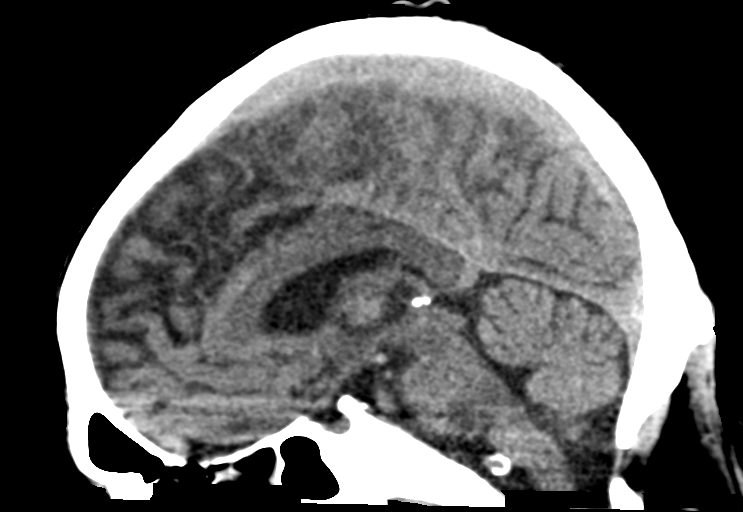
[im 37/55  brain]
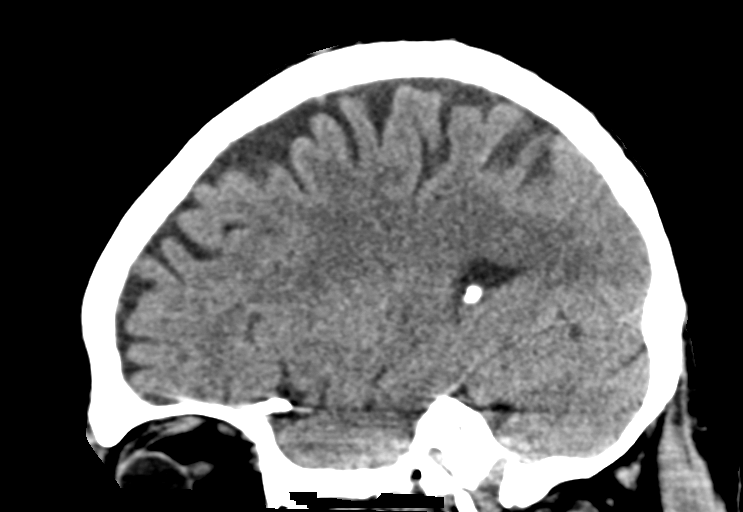

[14 of 47 positions shown; findings below may reference images not displayed]

FINDINGS: Brain: No evidence of acute infarction, hemorrhage, hydrocephalus,
extra-axial collection or mass lesion/mass effect. Stable chronic
microvascular ischemic changes and volume loss of the brain. Stable
small chronic lacunar infarcts within the right caudate head, right
putamen, and left anterior limb of internal capsule.

Vascular: Calcific atherosclerosis of the carotid siphons. No
hyperdense vessel identified.

Skull: Normal. Negative for fracture or focal lesion.

Sinuses/Orbits: No acute finding.

Other: None.
IMPRESSION: 1. No acute intracranial abnormality identified.
2. Stable chronic microvascular ischemic changes, brain volume loss,
and chronic lacunar infarcts of basal ganglia.

## 2019-12-11 IMAGING — DX DG CHEST 1V PORT
1 series · 1 of 1 positions shown · non-contrast
Comparison: 12/26/2017

CLINICAL DATA: Respiratory distress. Dialysis patient. Shortness of
breath.

EXAM:
PORTABLE CHEST 1 VIEW

[chest ap]
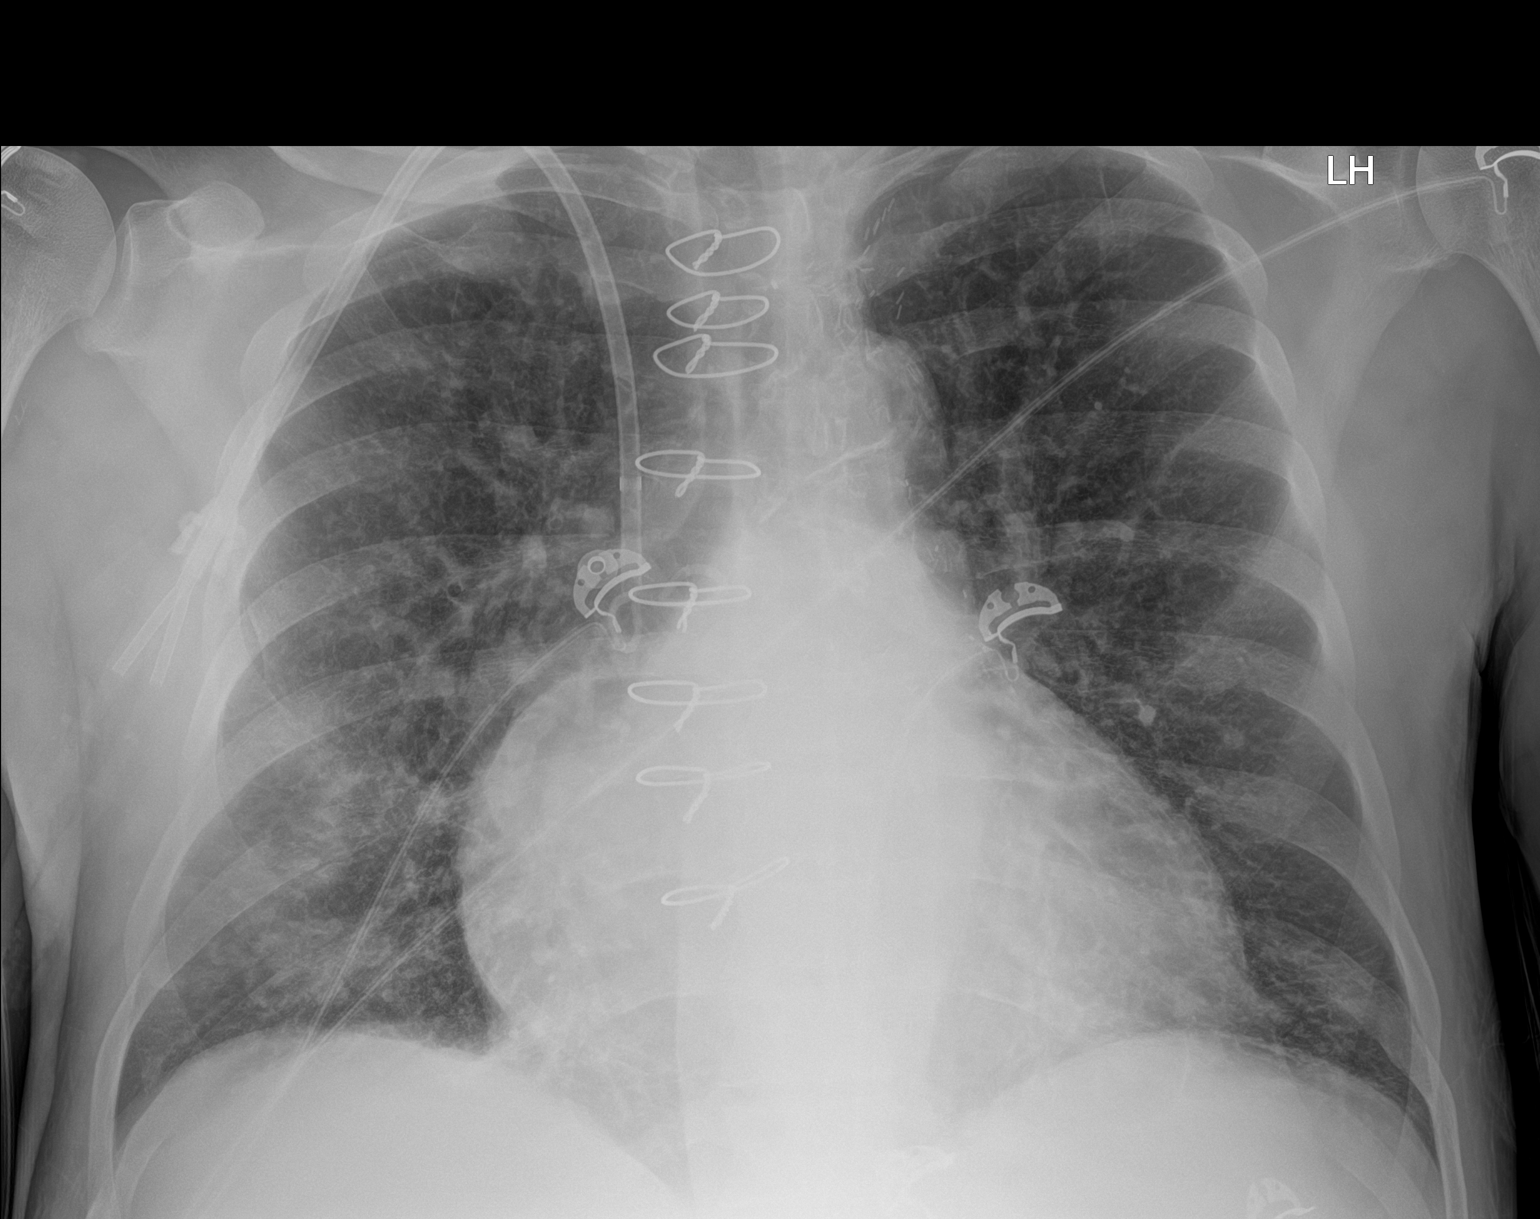

[1 of 1 positions shown; findings below may reference images not displayed]

FINDINGS: Sternotomy wires and right IJ dialysis catheter unchanged. Lungs are
adequately inflated with minimal stable prominence of the perihilar
markings likely mild vascular congestion. There is patchy density
over the right mid to lower lung which may be due to atelectasis or
infection. No effusion. Mild stable cardiomegaly. Remainder the exam
is unchanged.
IMPRESSION: Patchy density over the right mid to lower lung which may be due to
atelectasis or infection.

Mild cardiomegaly with mild stable vascular congestion.

## 2019-12-12 IMAGING — DX DG ABDOMEN 1V
2 series · 2 of 2 positions shown · non-contrast
Comparison: None.

CLINICAL DATA: Abdominal distention.

EXAM:
ABDOMEN - 1 VIEW

[abdomen supine (1 of 2)]
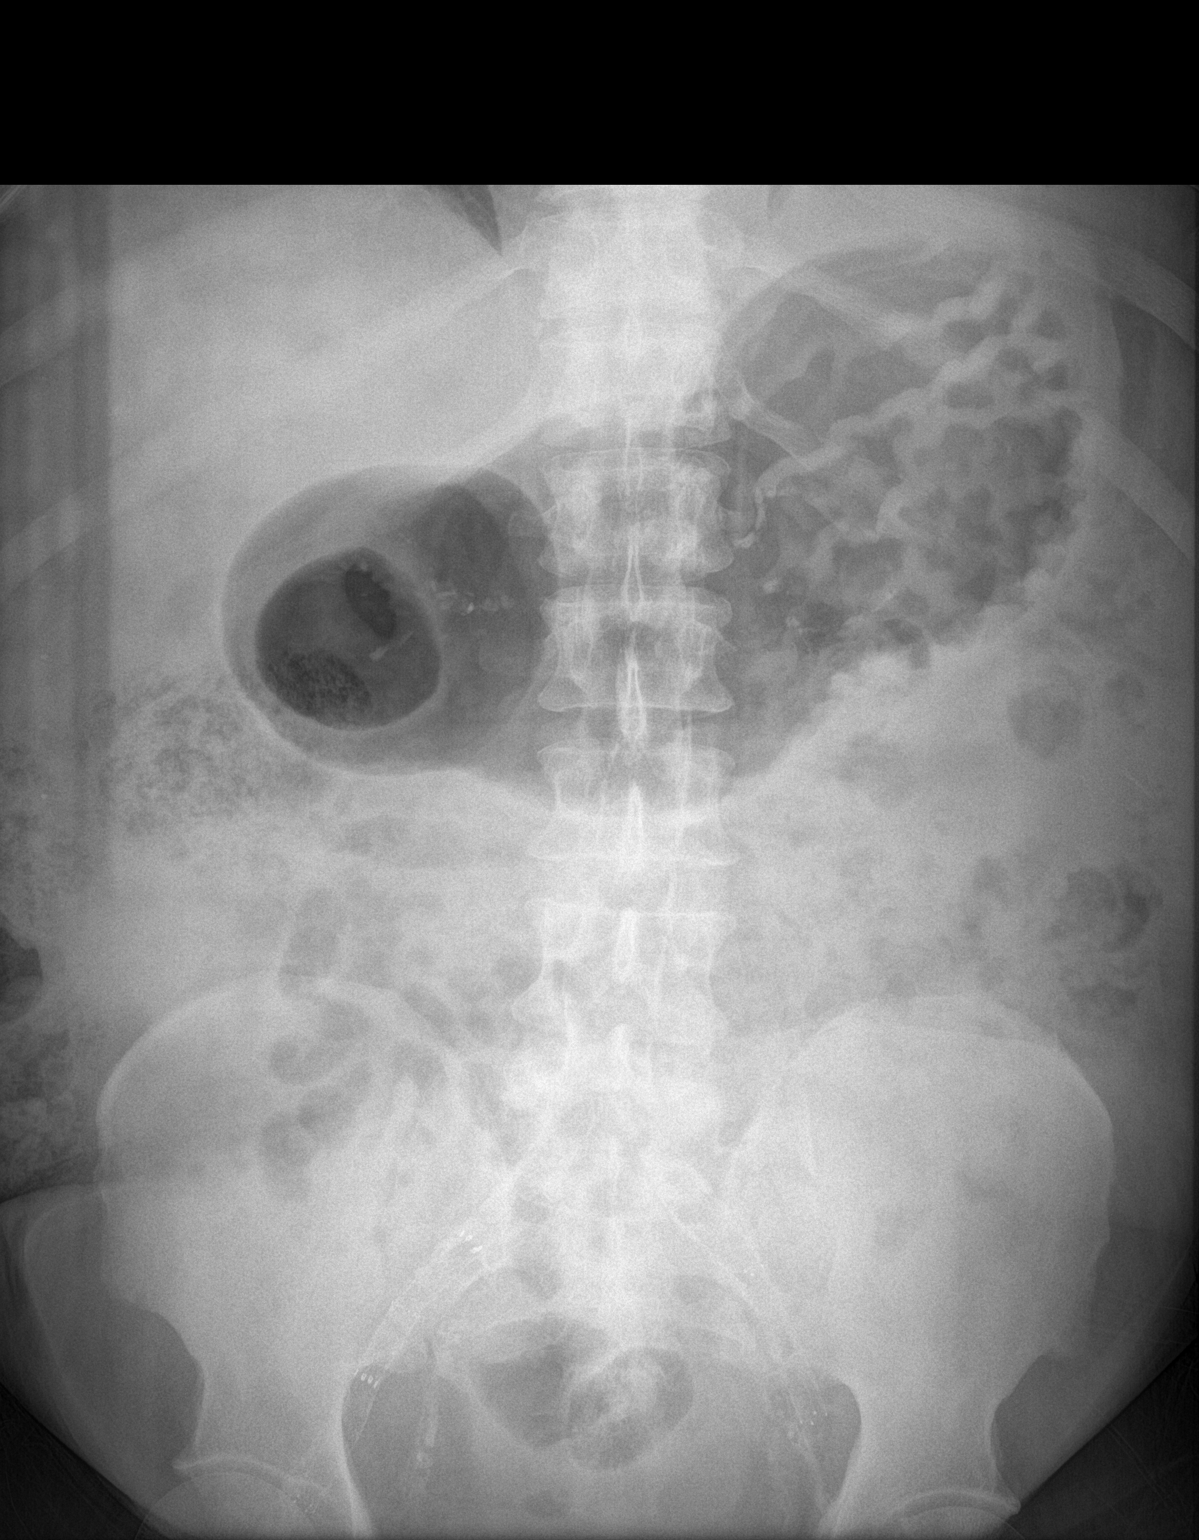

[abdomen supine (2 of 2)]
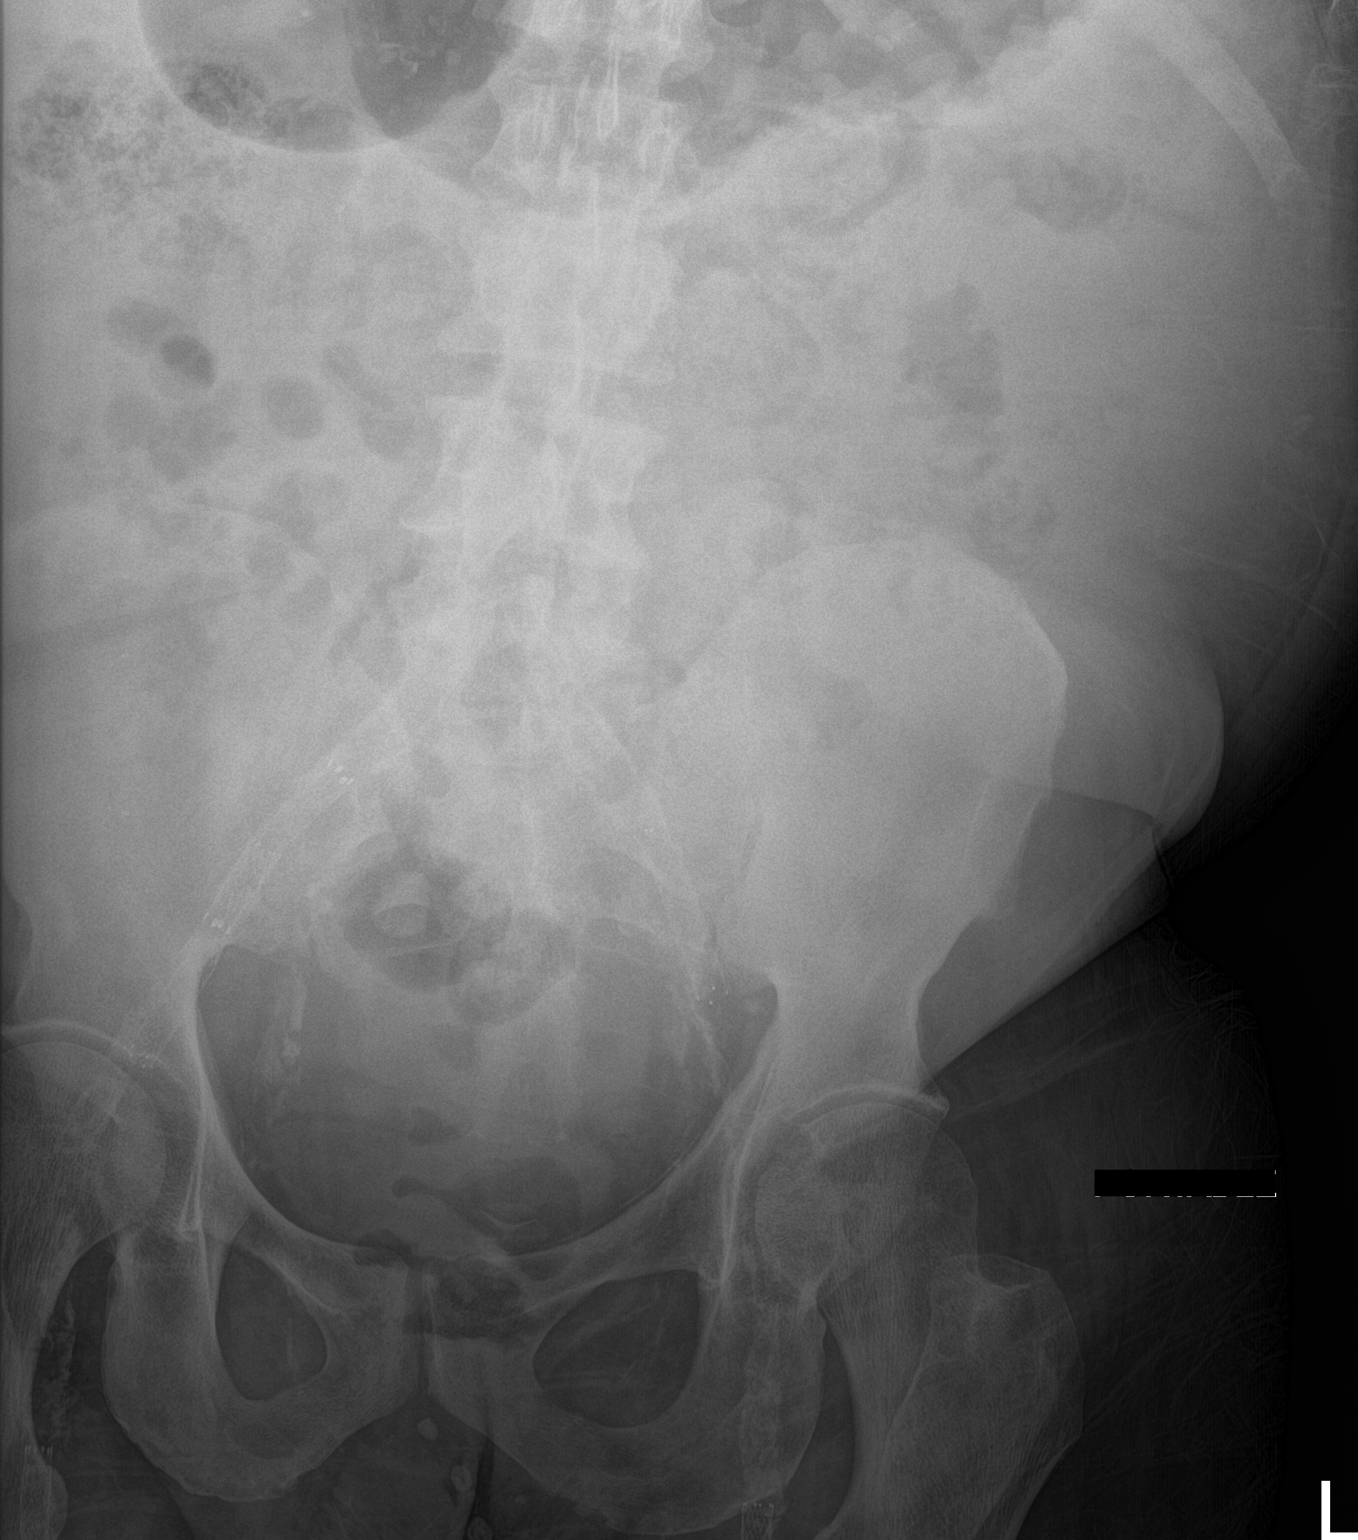

[2 of 2 positions shown; findings below may reference images not displayed]

FINDINGS: Gaseous distention of the stomach with suggestion of gastric fold
thickening. This may indicate gastroparesis and/or gastritis.
Scattered gas and stool in the colon. No small or large bowel
distention. No radiopaque stones. Vascular calcifications with iliac
stents. Visualized bones appear intact. No radiopaque soft tissue
foreign bodies.
IMPRESSION: Gaseous distention of the stomach with suggestion of gastric fold
thickening. Changes may represent gastroparesis and/or gastritis.

## 2019-12-12 IMAGING — MR MR HEAD W/O CM
9 of 10 series · 43 of 48 positions shown · non-contrast
Comparison: 01/15/2018 CT head.

CLINICAL DATA: 57 y/o M; right facial numbness and right-sided
weakness.

EXAM:
MRI HEAD WITHOUT CONTRAST
TECHNIQUE: Multiplanar, multiecho pulse sequences of the brain and surrounding
structures were obtained without intravenous contrast.

[Series 2: ax dwi_tracew · axial · 3.0mm · 0.83mm/px · z∈[-59,+103]mm · 7 of 55 slices shown]
[im 1/55]
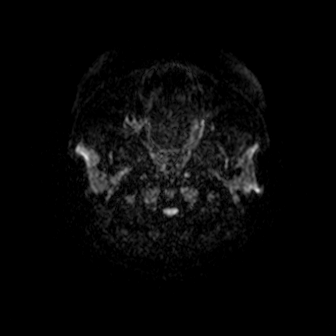
[im 10/55]
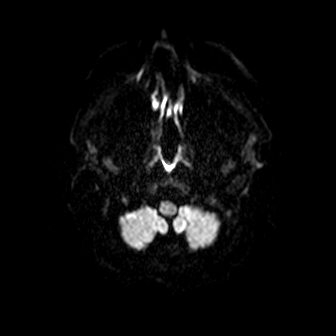
[im 19/55]
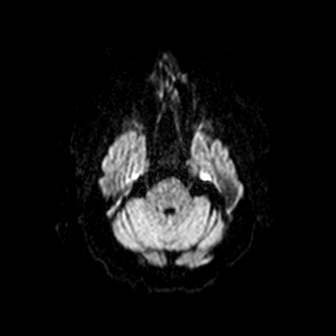
[im 28/55]
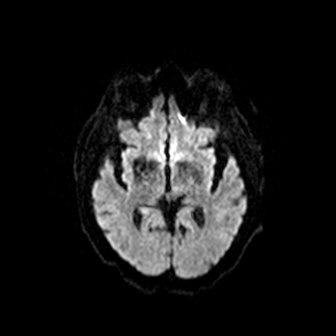
[im 37/55]
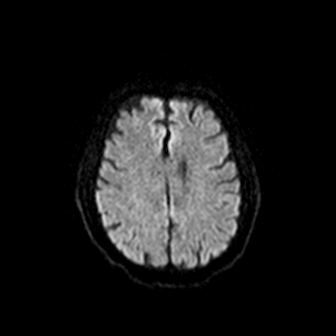
[im 46/55]
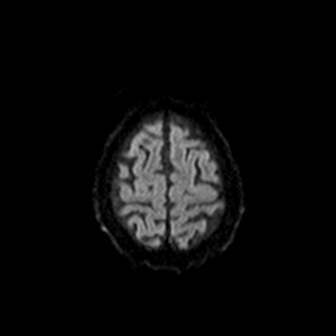
[im 55/55]
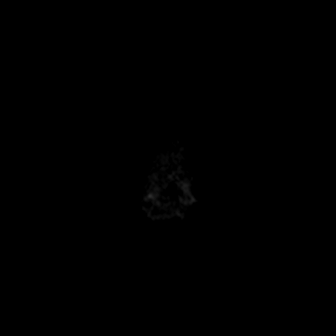

[Series 3: ax dwi_adc · axial · 3.0mm · 0.83mm/px · z∈[-59,+103]mm · 8 of 55 slices shown]
[im 1/55]
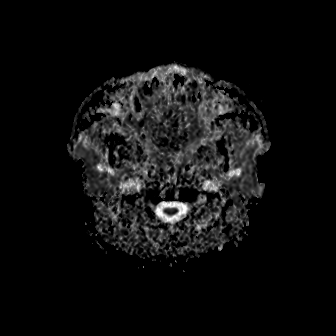
[im 8/55]
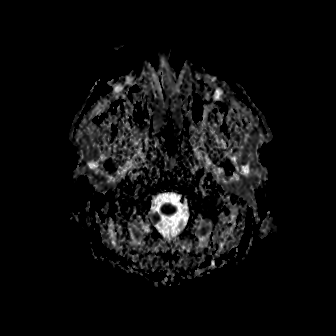
[im 16/55]
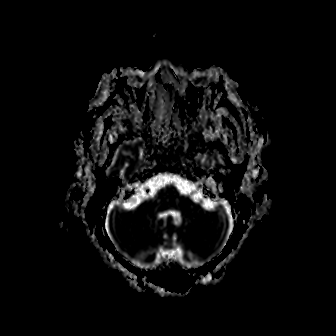
[im 24/55]
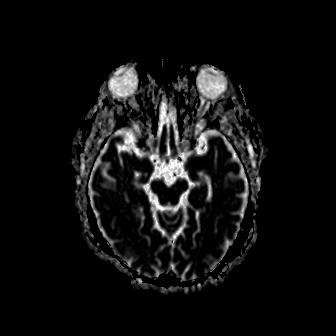
[im 31/55]
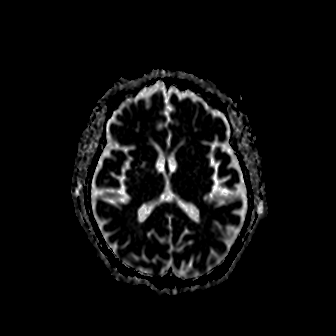
[im 39/55]
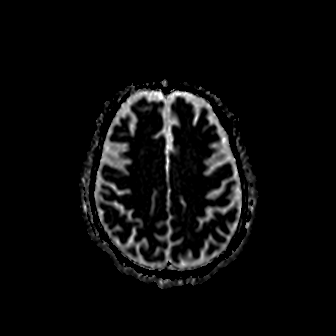
[im 47/55]
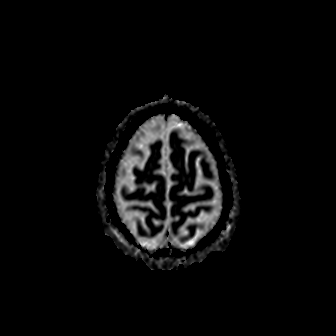
[im 55/55]
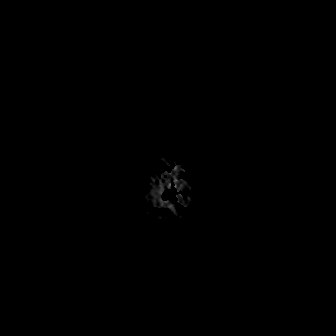

[Series 4: cor dwi_tracew · coronal · 5.0mm · 0.68mm/px · 5 of 39 slices shown]
[im 1/39]
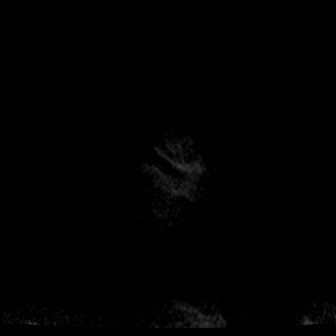
[im 10/39]
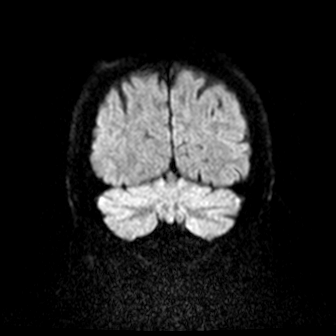
[im 20/39]
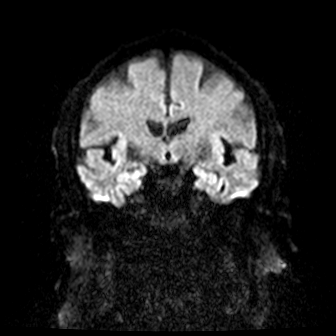
[im 29/39]
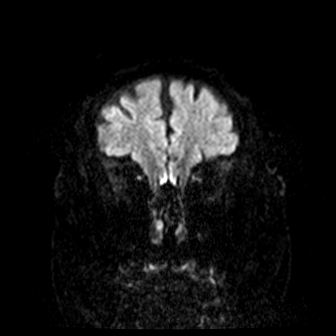
[im 39/39]
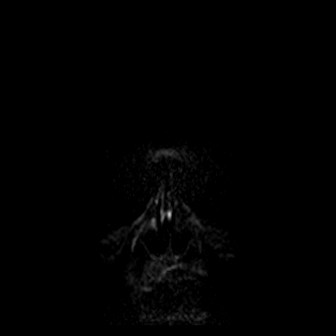

[Series 5: cor dwi_adc · coronal · 5.0mm · 0.68mm/px · 4 of 39 slices shown]
[im 1/39]
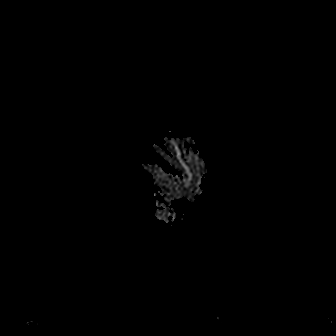
[im 10/39]
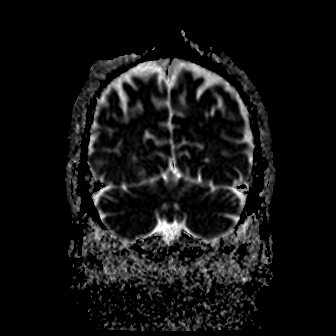
[im 20/39]
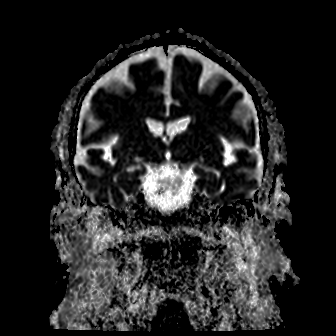
[im 29/39]
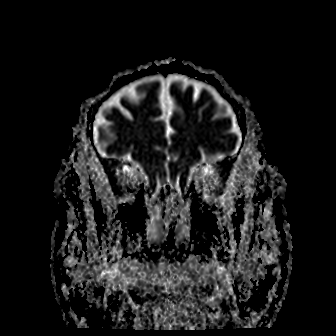

[Series 6: FLAIR · axial · 5.0mm · 1.20mm/px · z∈[-53,+103]mm · 4 of 27 slices shown]
[im 1/27]
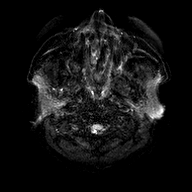
[im 9/27]
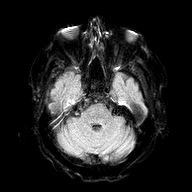
[im 18/27]
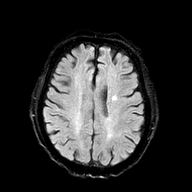
[im 27/27]
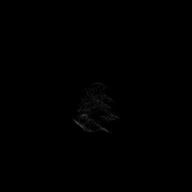

[Series 8: T2 · axial · 5.0mm · 0.45mm/px · z∈[-53,+103]mm · 4 of 27 slices shown (1 of 2)]
[im 1/27]
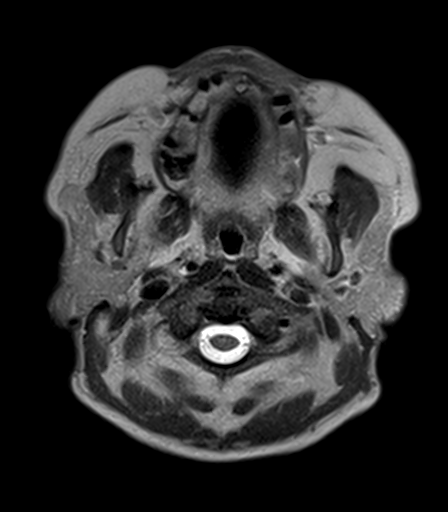
[im 9/27]
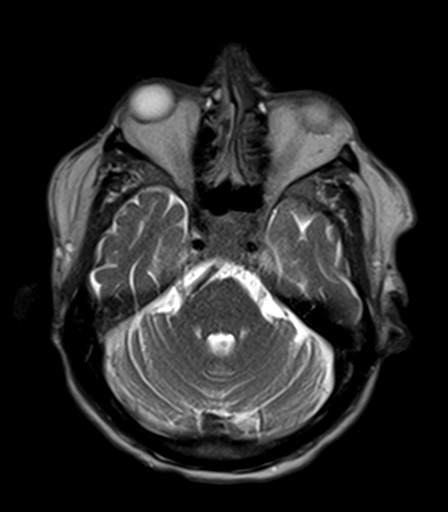
[im 18/27]
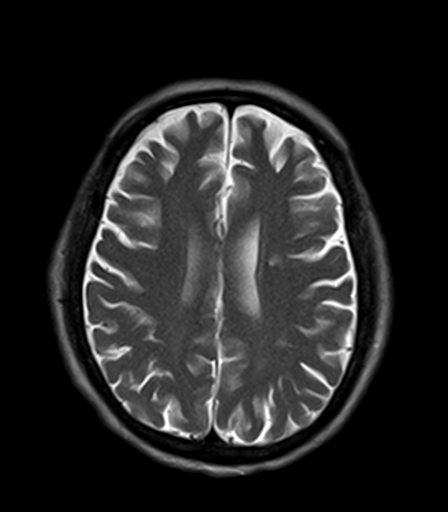
[im 27/27]
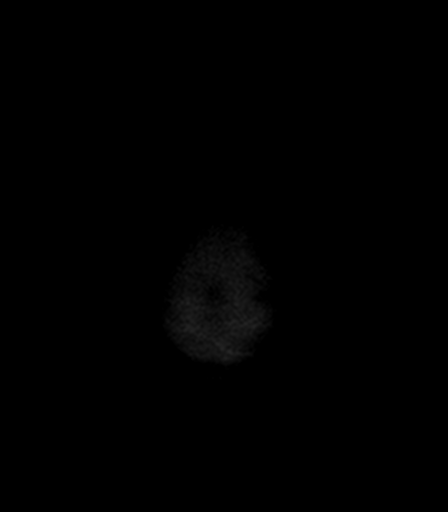

[Series 9: T1 · sagittal · 5.0mm · 0.94mm/px · 3 of 25 slices shown (1 of 2)]
[im 1/25]
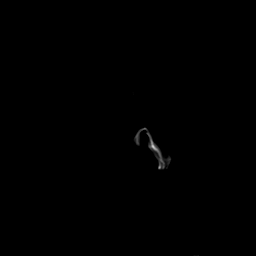
[im 13/25]
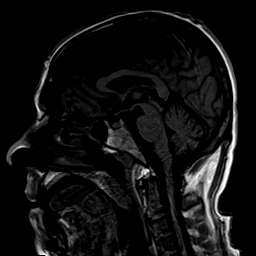
[im 25/25]
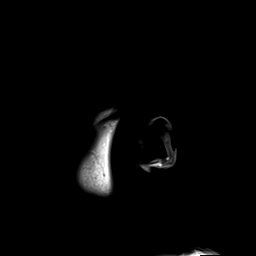

[Series 10: T1 · axial · 5.0mm · 0.90mm/px · z∈[-53,+103]mm · 4 of 27 slices shown (2 of 2)]
[im 1/27]
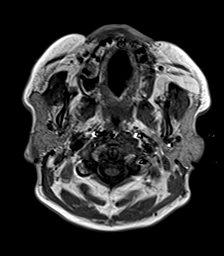
[im 9/27]
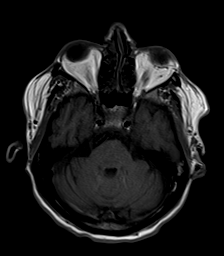
[im 18/27]
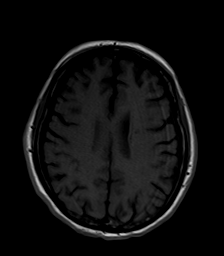
[im 27/27]
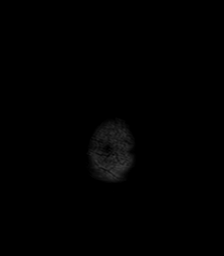

[Series 11: T2 · coronal · 5.0mm · 0.45mm/px · 4 of 31 slices shown (2 of 2)]
[im 1/31]
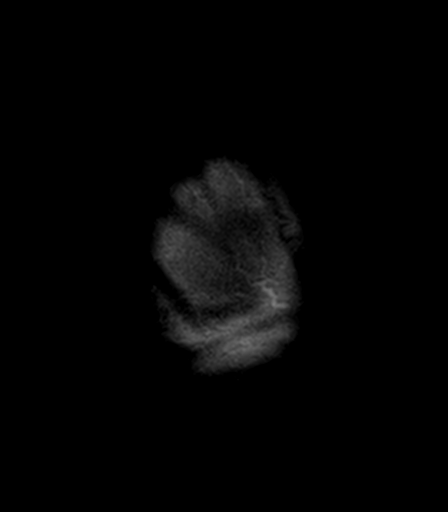
[im 11/31]
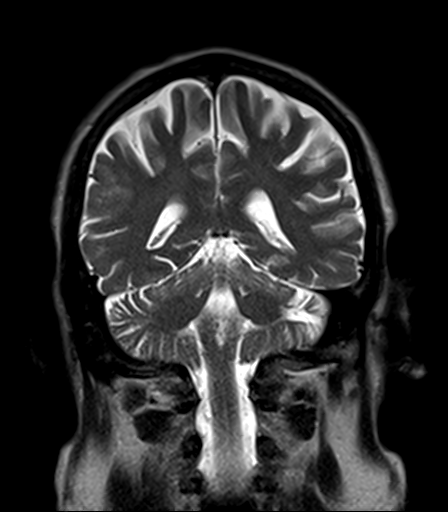
[im 21/31]
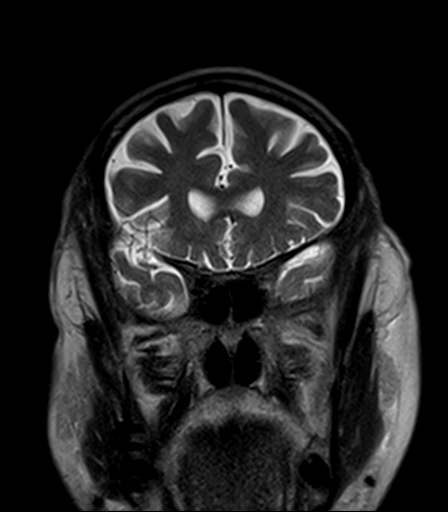
[im 31/31]
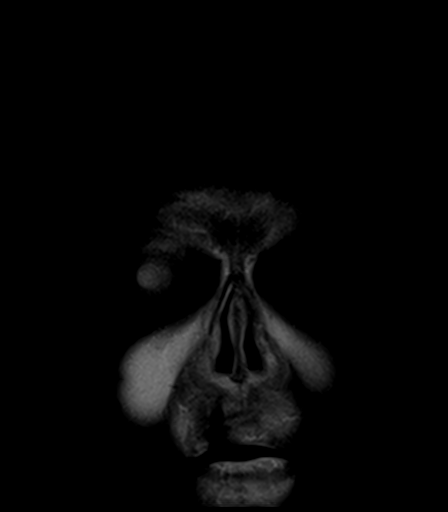

[43 of 48 positions shown; findings below may reference images not displayed]

FINDINGS: Brain: No acute infarction, hemorrhage, hydrocephalus, extra-axial
collection or mass lesion. Small chronic infarcts are present within
the right caudate head, right lentiform nucleus, left anterior limb
of internal capsule, and right paramedian pons. Moderate volume loss
of the brain for age.

Vascular: Normal flow voids.

Skull and upper cervical spine: Normal marrow signal.

Sinuses/Orbits: Negative.

Other: None.
IMPRESSION: 1. No acute intracranial abnormality identified.
2. Stable chronic infarcts within the basal ganglia and volume loss
of the brain.

## 2019-12-12 IMAGING — DX DG ORBITS FOR FOREIGN BODY
2 series · 2 of 2 positions shown · non-contrast
Comparison: None.

CLINICAL DATA: Metal working/exposure; clearance prior to MRI

EXAM:
ORBITS FOR FOREIGN BODY - 2 VIEW

[skull waters (1 of 2)]
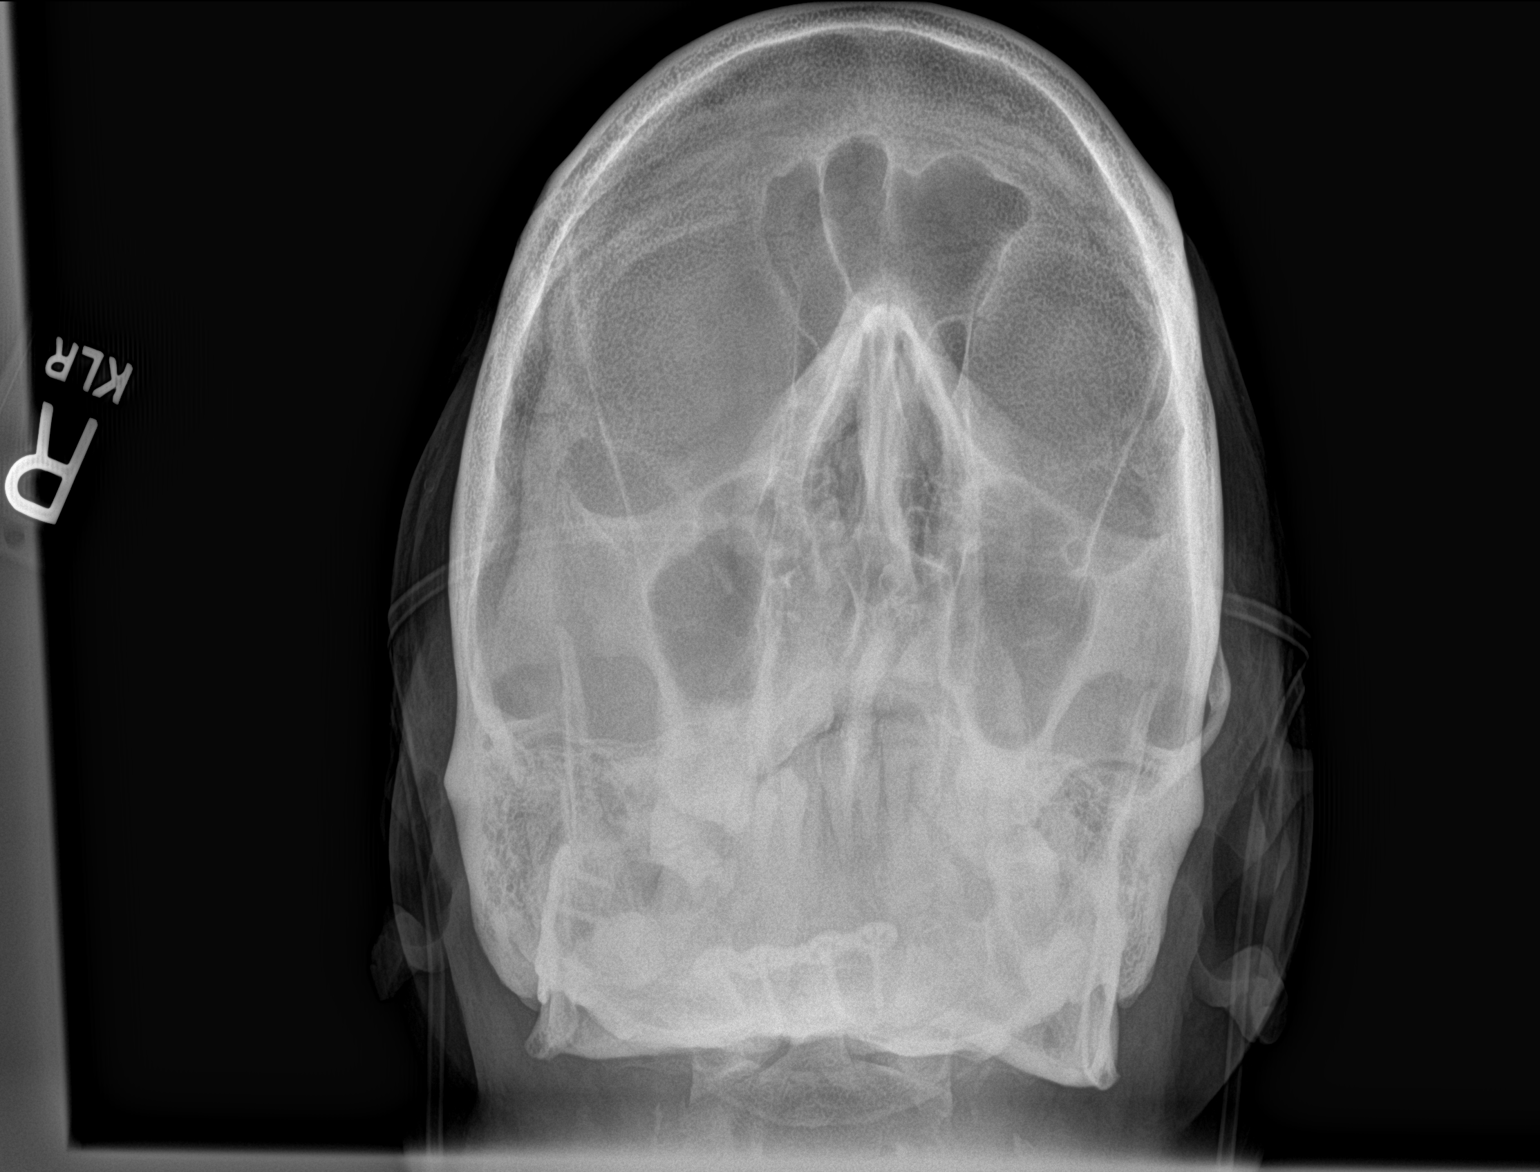

[skull waters (2 of 2)]
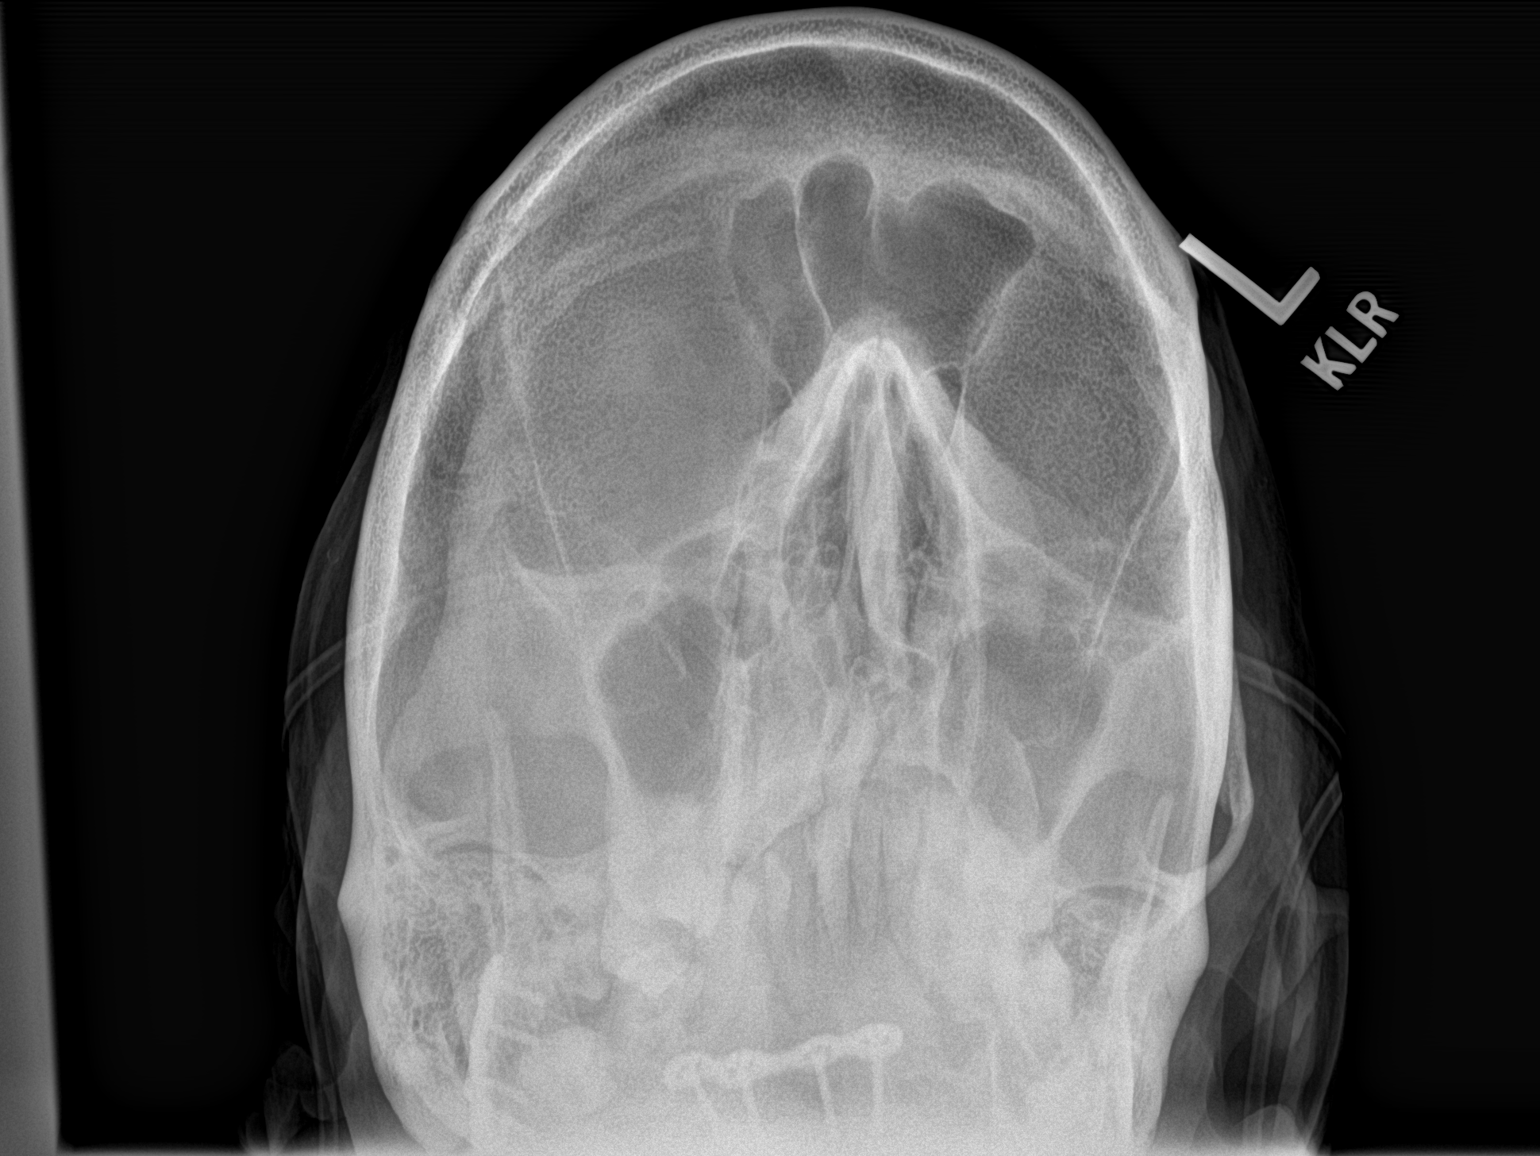

[2 of 2 positions shown; findings below may reference images not displayed]

FINDINGS: There is no evidence of metallic foreign body within the orbits. No
significant bone abnormality identified. The observed paranasal
sinuses are clear.
IMPRESSION: No evidence of metallic foreign body within the orbits.

## 2019-12-12 IMAGING — CR DG CHEST 2V
3 series · 3 of 3 positions shown · non-contrast
Comparison: Radiograph January 15, 2018.

CLINICAL DATA: Pneumonia.

EXAM:
CHEST - 2 VIEW

[chest lat (1 of 2)]
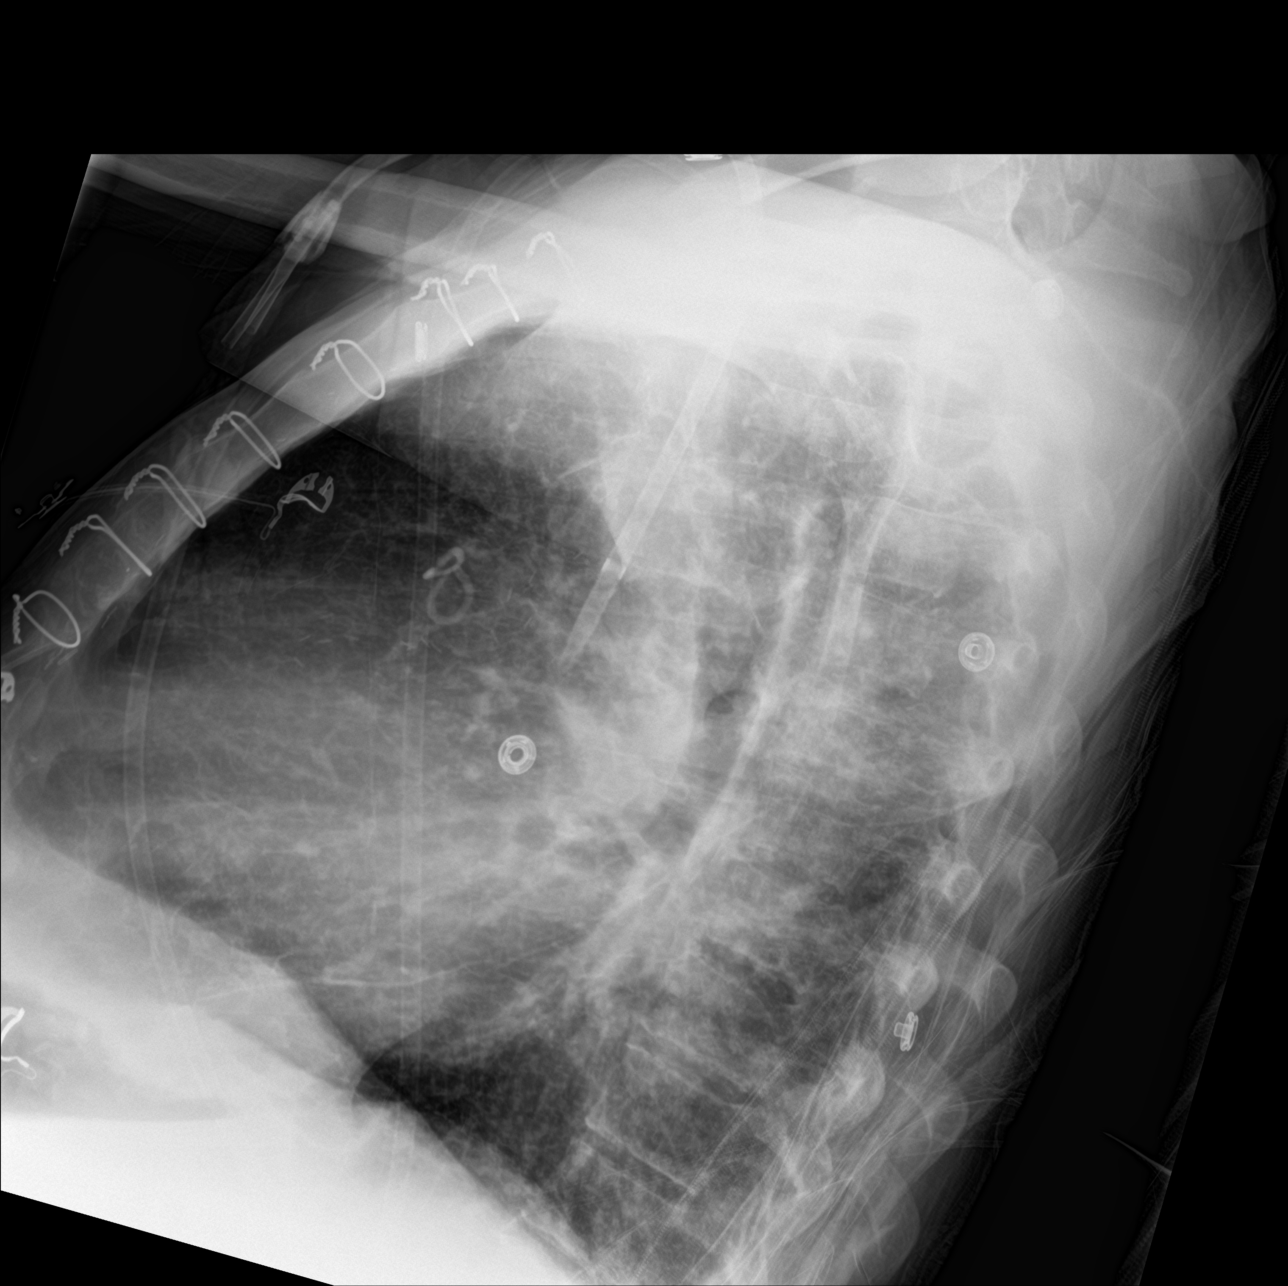

[chest ap]
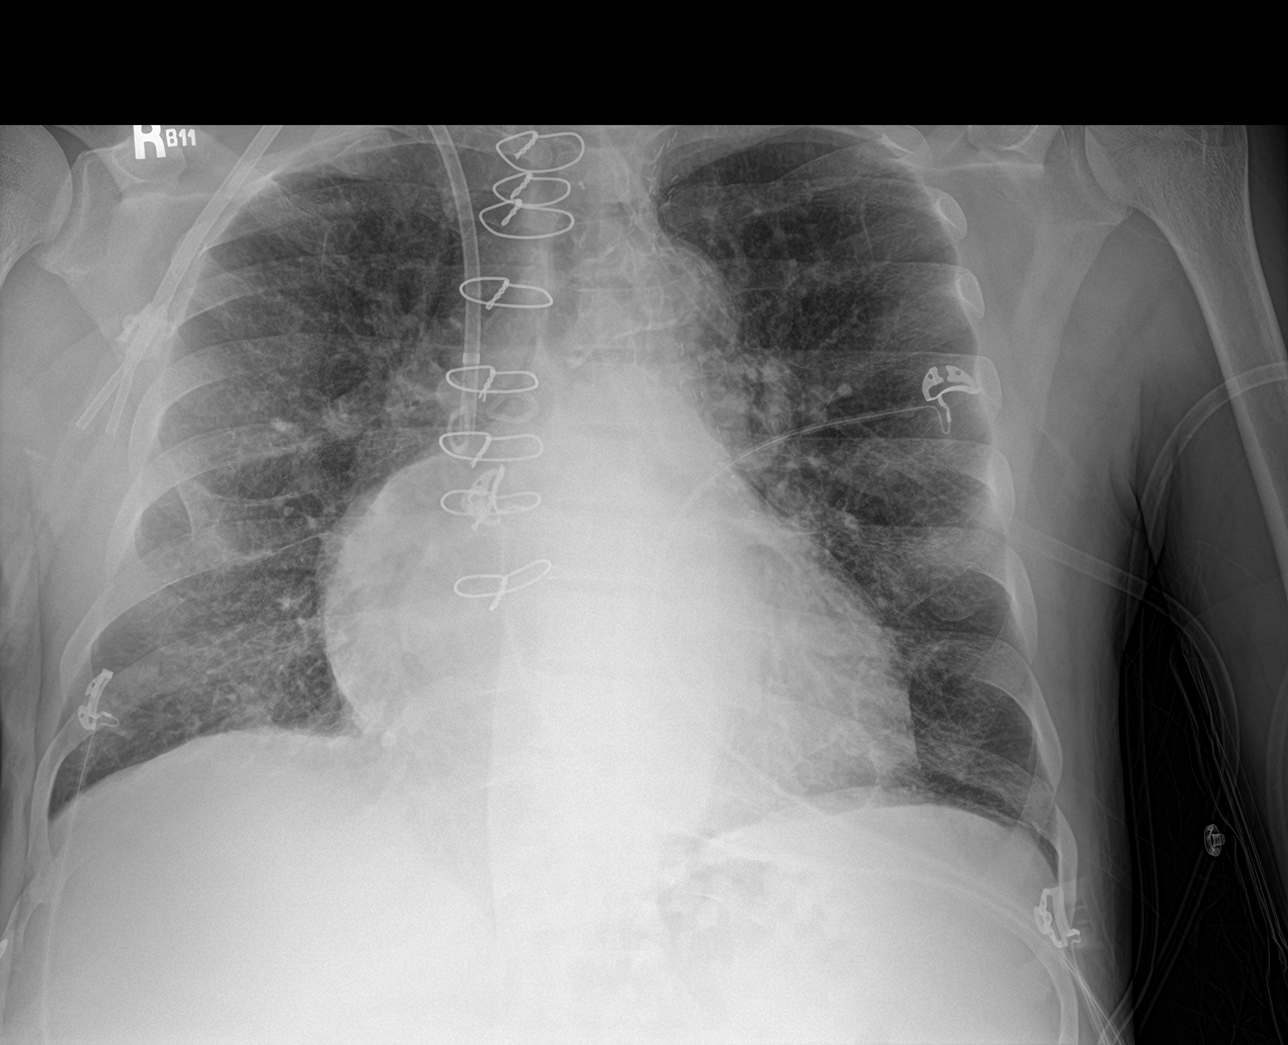

[chest lat (2 of 2)]
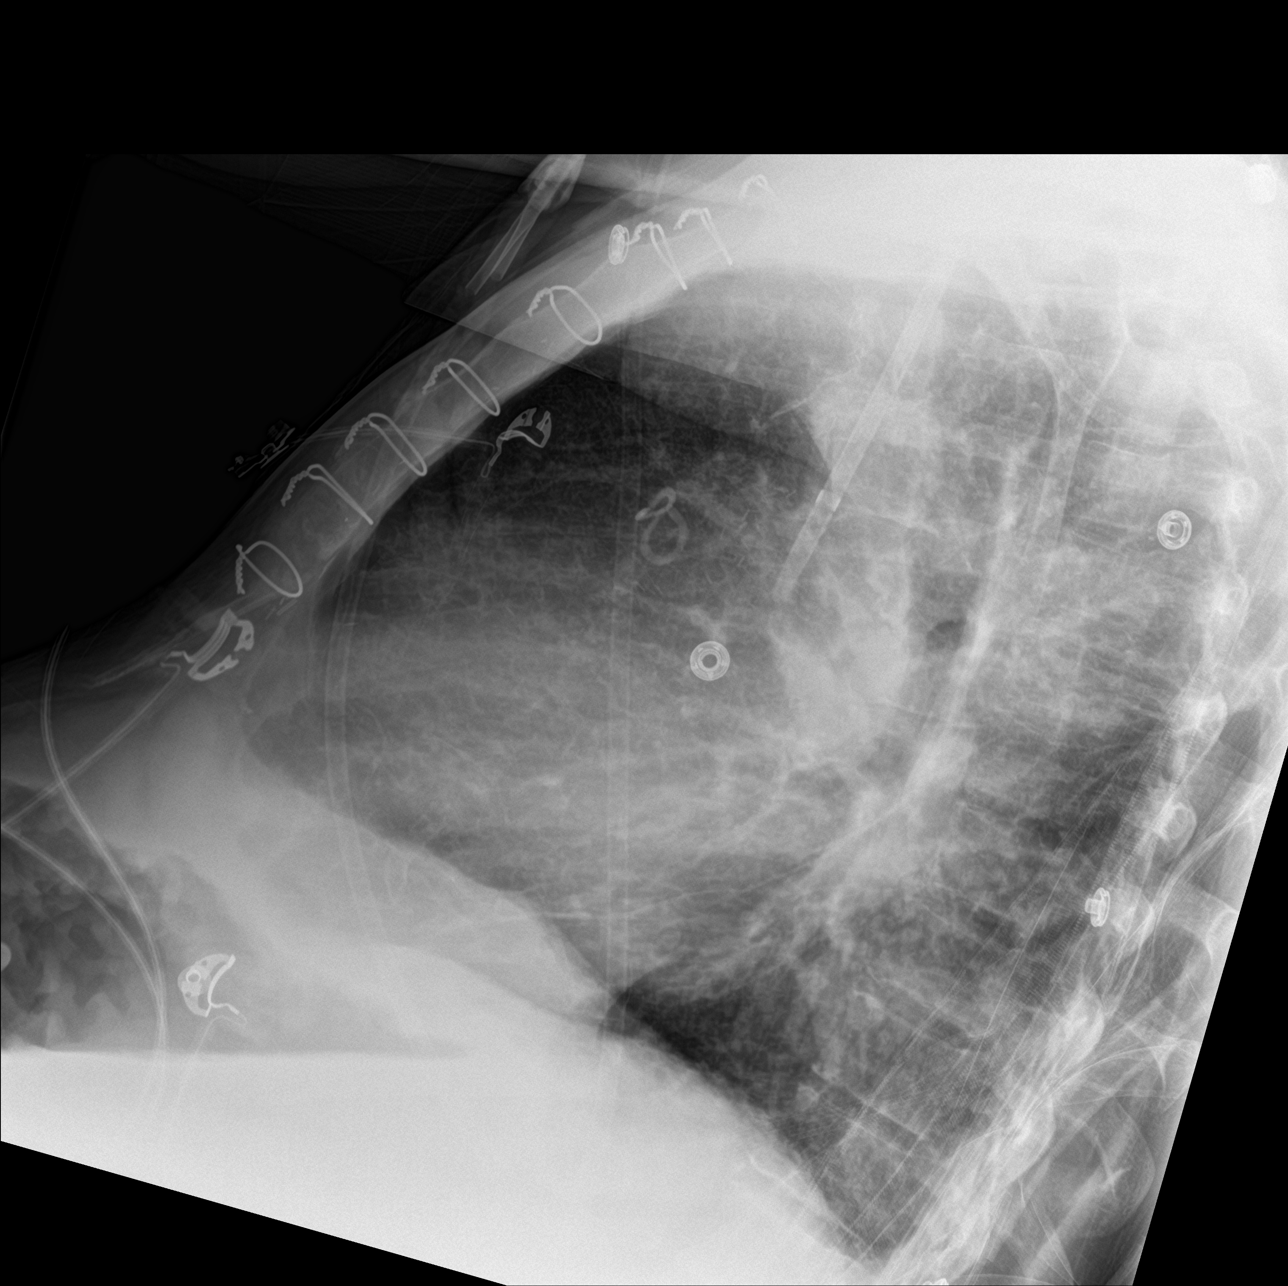

[3 of 3 positions shown; findings below may reference images not displayed]

FINDINGS: Stable cardiomegaly. Atherosclerosis of thoracic aorta is noted.
Sternotomy wires are noted. Right internal jugular dialysis catheter
is unchanged in position. No pneumothorax or pleural effusion is
noted. No consolidative process is seen currently. Bony thorax is
unremarkable.
IMPRESSION: No acute consolidative process is noted.

Aortic Atherosclerosis (AG0FK-CJE.E).

## 2019-12-13 IMAGING — US US PARACENTESIS
1 series · 5 of 5 positions shown · non-contrast
Comparison: Multiple previous ultrasound-guided paracenteses most
recently on 12/25/2017 yielding 6.75 L

MEDICATIONS:
None.

COMPLICATIONS:
None immediate.

INDICATION: History of cirrhosis with recurrent symptomatic ascites. Please
perform ultrasound-guided paracentesis for therapeutic purposes.

EXAM:
ULTRASOUND-GUIDED PARACENTESIS
TECHNIQUE: Informed written consent was obtained from the patient after a
discussion of the risks, benefits and alternatives to treatment. A
timeout was performed prior to the initiation of the procedure.

[Series 1: us paracentesis · 5 of 5 slices shown]
[im 1/5]
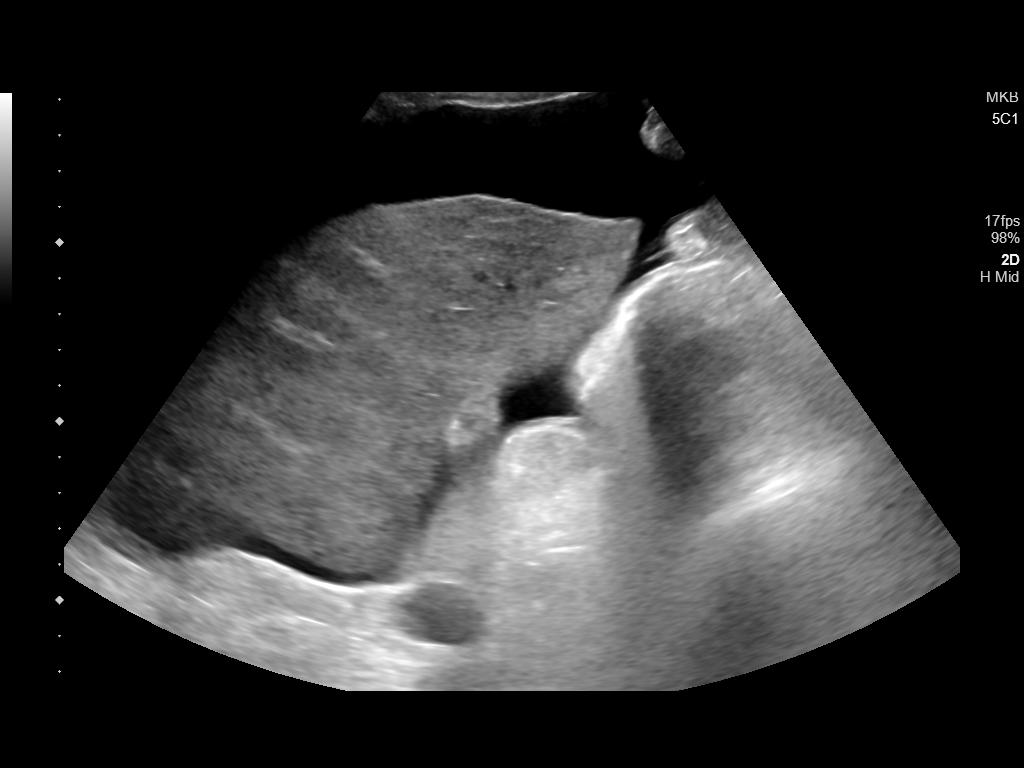
[im 2/5]
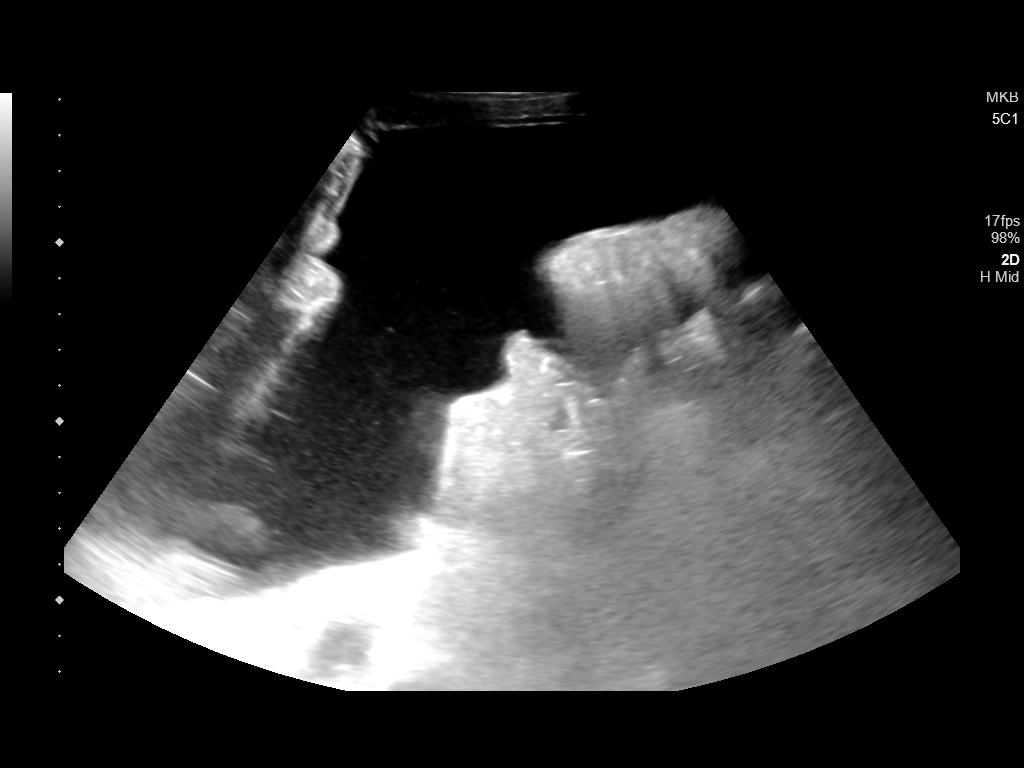
[im 3/5]
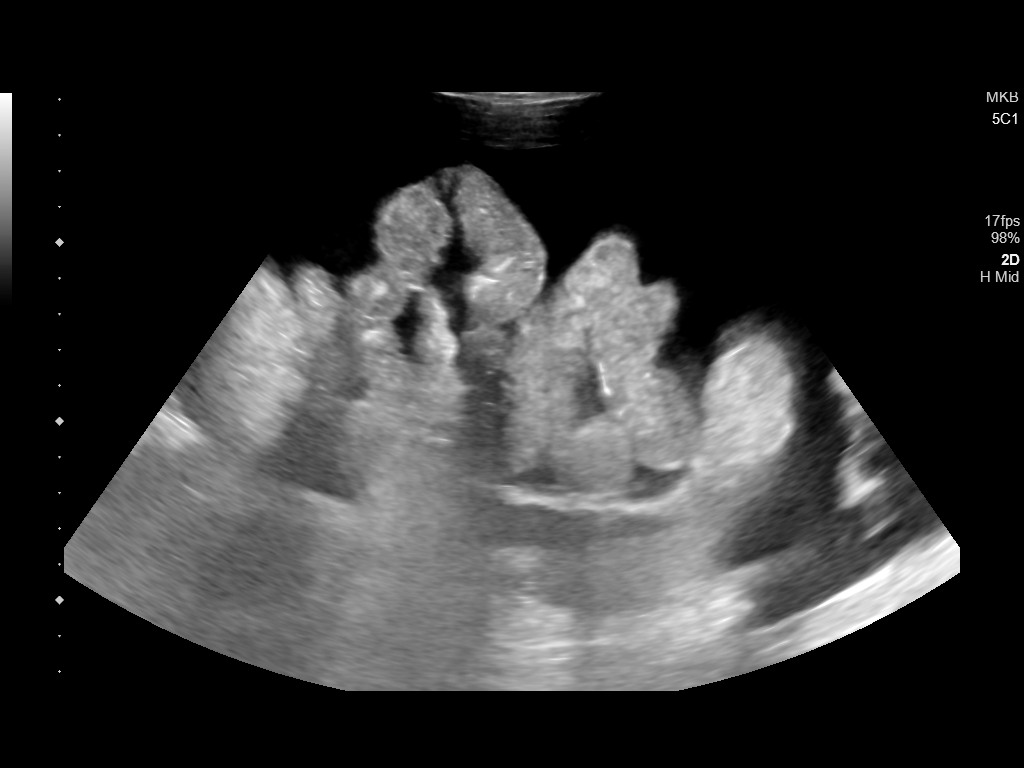
[im 4/5]
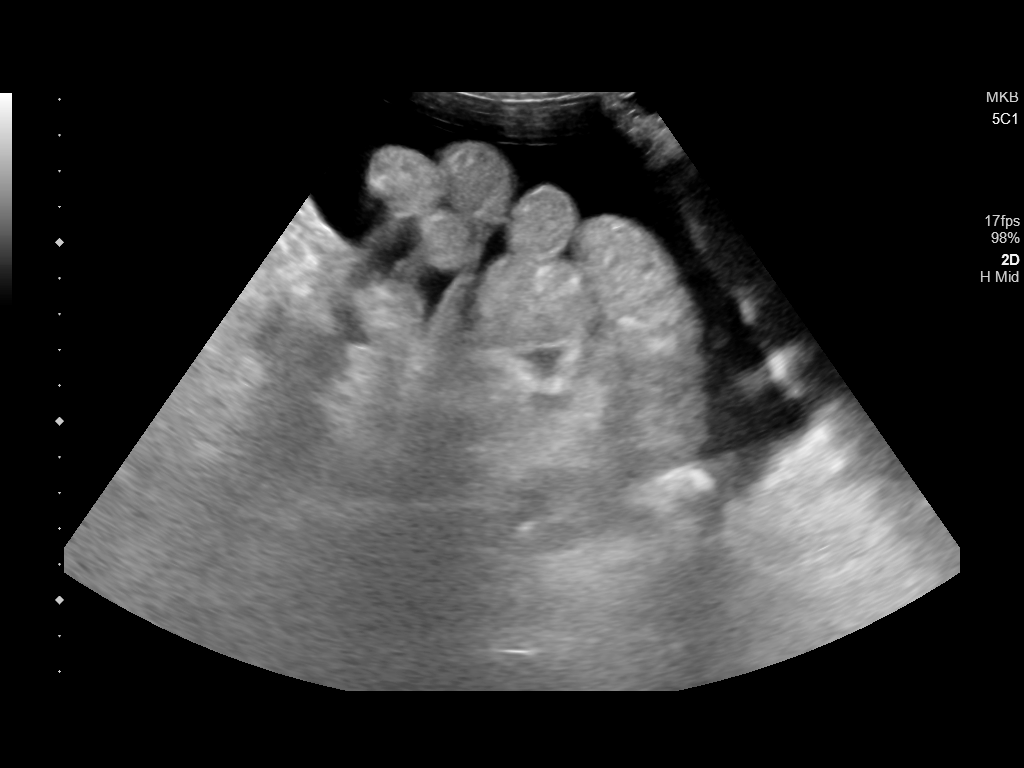
[im 5/5]
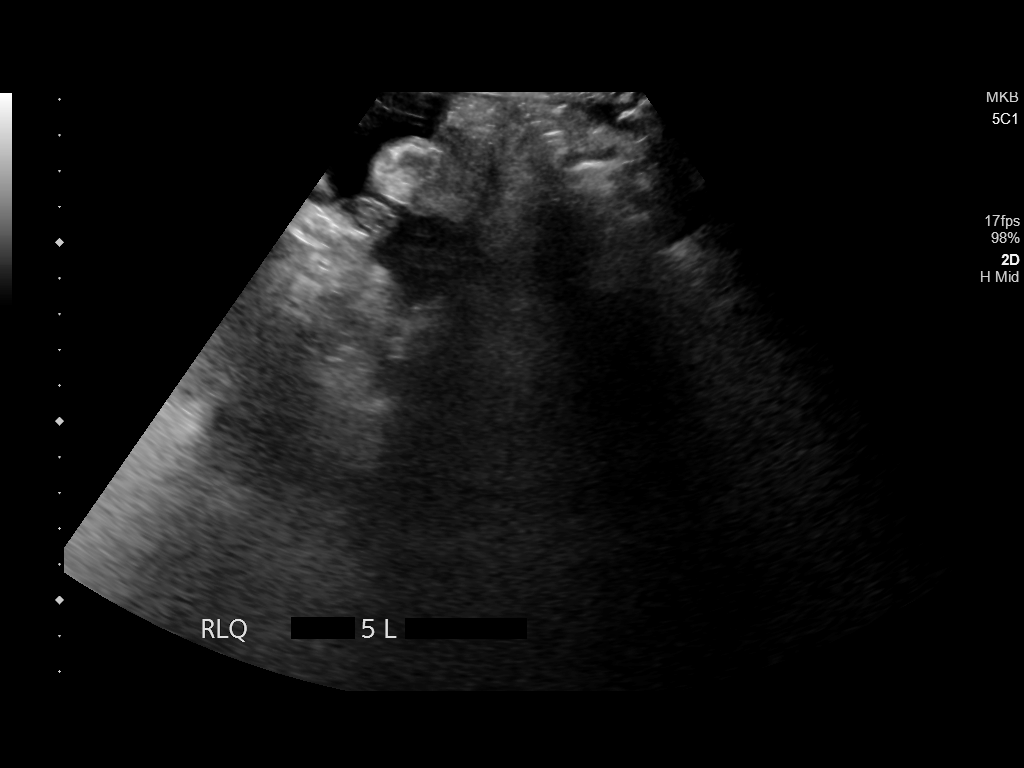

[5 of 5 positions shown; findings below may reference images not displayed]

Initial ultrasound scanning demonstrates a moderate amount of
ascites within the right lower abdominal quadrant. The right lower
abdomen was prepped and draped in the usual sterile fashion. 1%
lidocaine with epinephrine was used for local anesthesia. An
ultrasound image was saved for documentation purposed. An 8 Fr
Safe-T-Centesis catheter was introduced. The paracentesis was
performed. The catheter was removed and a dressing was applied. The
patient tolerated the procedure well without immediate post
procedural complication.
FINDINGS: A total of approximately 5 liters of serous fluid was removed.
IMPRESSION: Successful ultrasound-guided paracentesis yielding 5 liters of
peritoneal fluid.

## 2019-12-19 IMAGING — CR DG CHEST 2V
2 series · 2 of 2 positions shown · non-contrast
Comparison: Chest x-ray of May 03, 2017

CLINICAL DATA: Mid chest pain and shortness of breath for the past
6 days. The patient has missed several dialysis episodes. History of
CHF, coronary artery disease, and peripheral vascular disease.
Previous CABG.

EXAM:
CHEST - 2 VIEW

[chest pa]
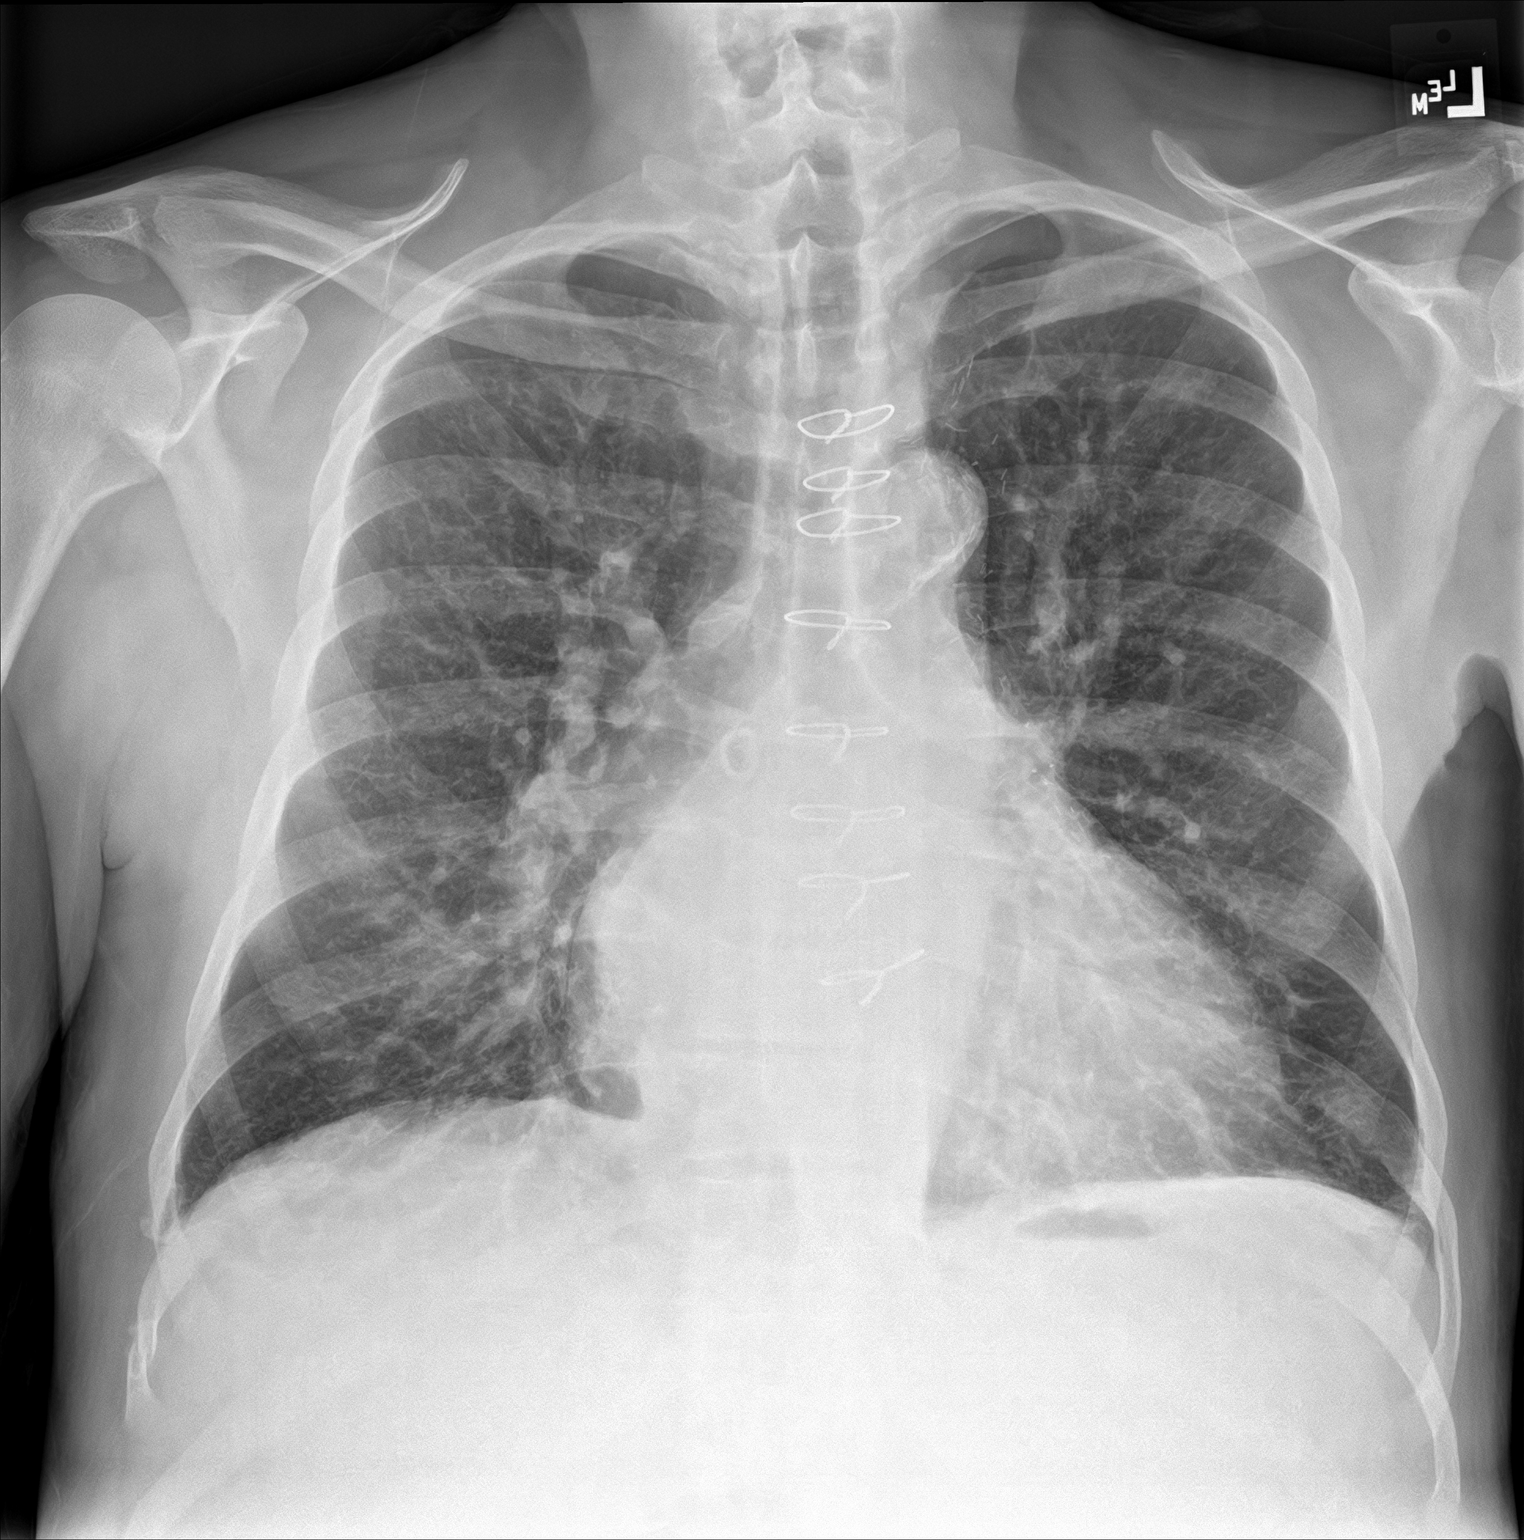

[chest lat]
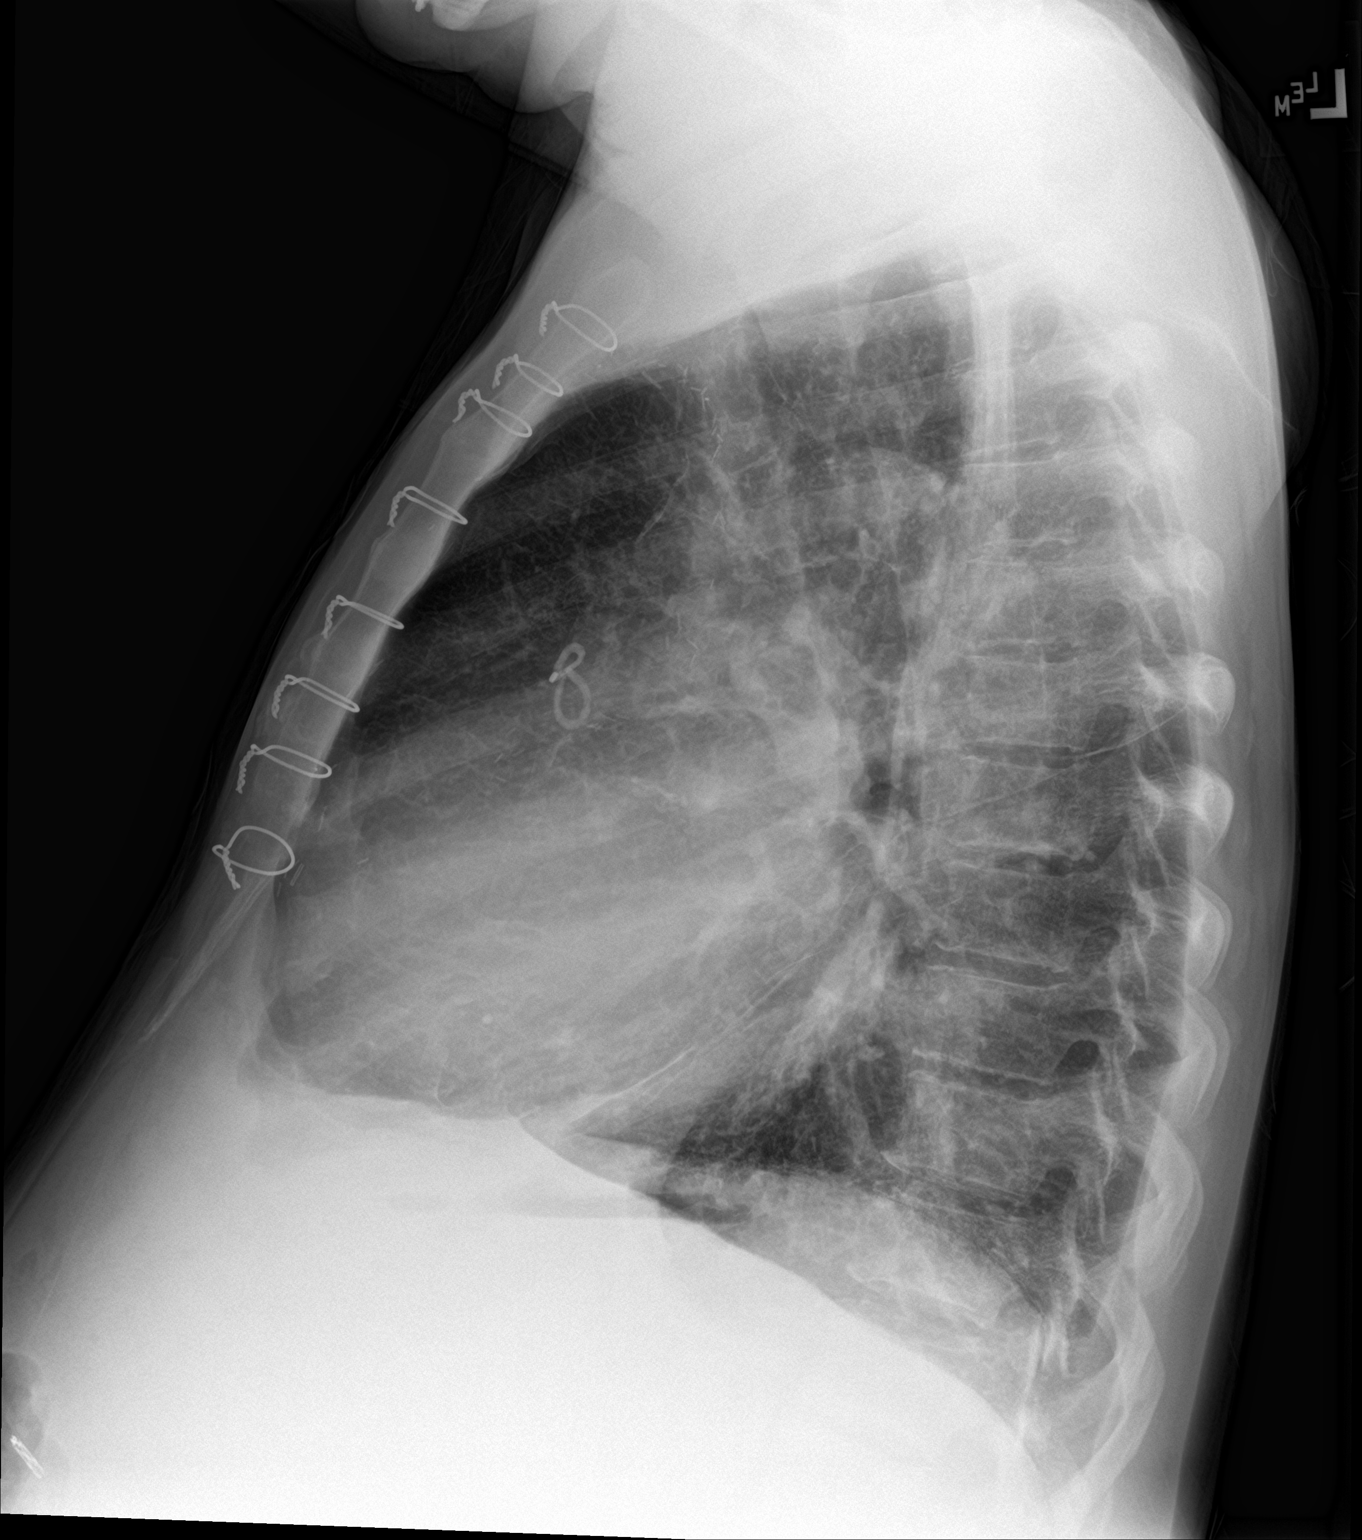

[2 of 2 positions shown; findings below may reference images not displayed]

FINDINGS: The lungs are well-expanded. The interstitial markings are
increased. The pulmonary vascularity is engorged. The cardiac
silhouette is mildly enlarged. There are post CABG changes. There is
calcification in the wall of the thoracic aorta. A trace of pleural
fluid blunts the posterior costophrenic angles.
IMPRESSION: Findings compatible with low-grade CHF.  No alveolar pneumonia.

Thoracic aortic atherosclerosis.

## 2019-12-24 IMAGING — US US PARACENTESIS
1 series · 7 of 7 positions shown · non-contrast
Comparison: none

INDICATION: Abdominal ascites, end-stage renal disease, recurrent distension

[Series 1: us paracentesis · 0.26mm/px · 7 of 7 slices shown]
[im 1/7]
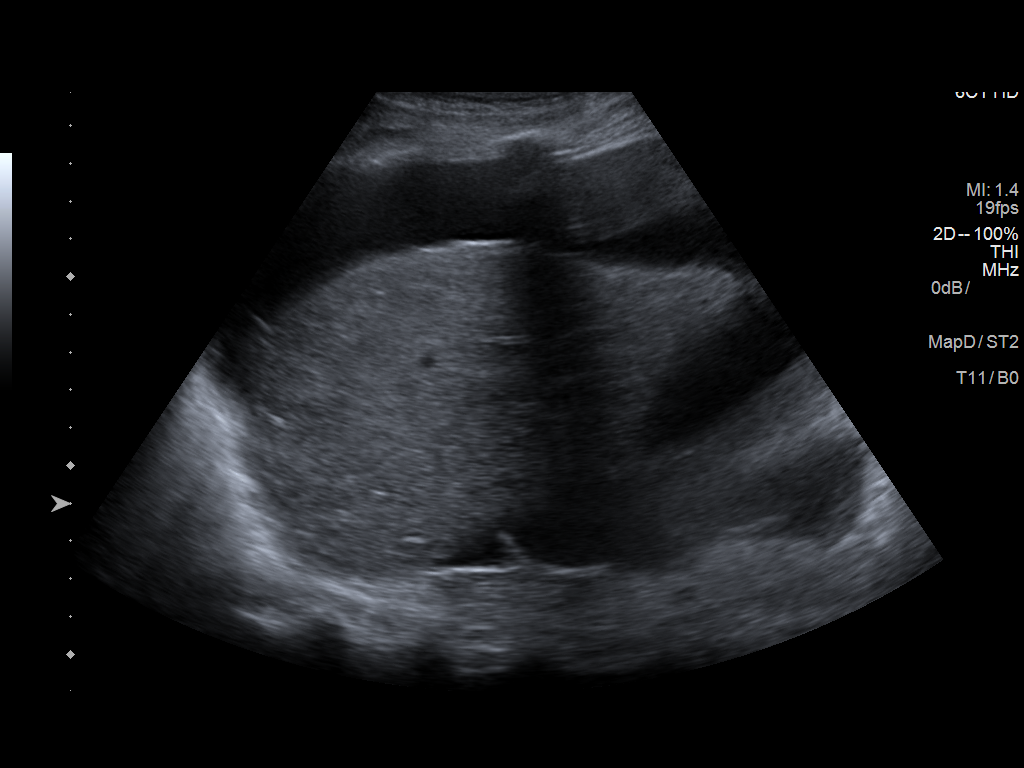
[im 2/7]
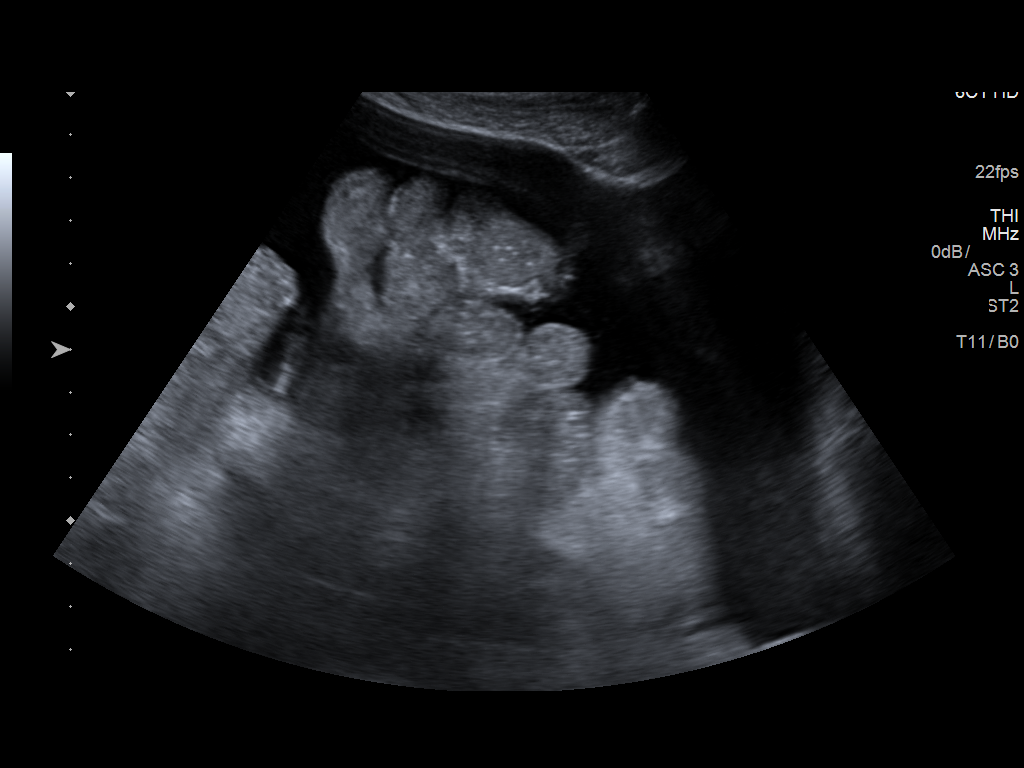
[im 3/7]
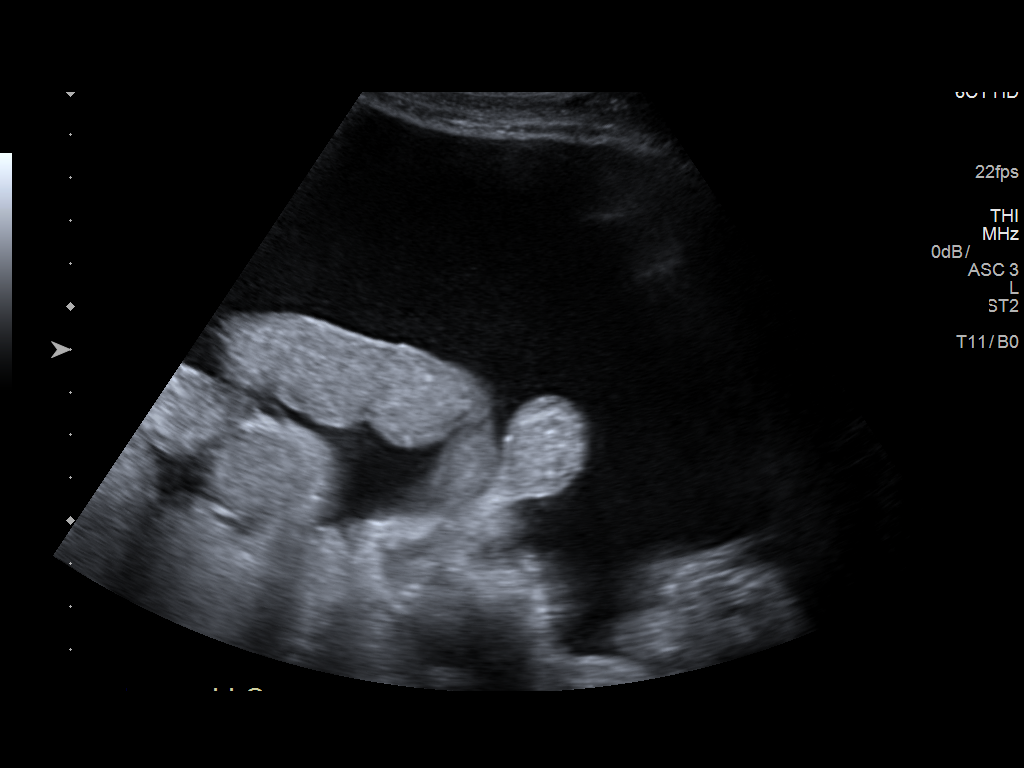
[im 4/7]
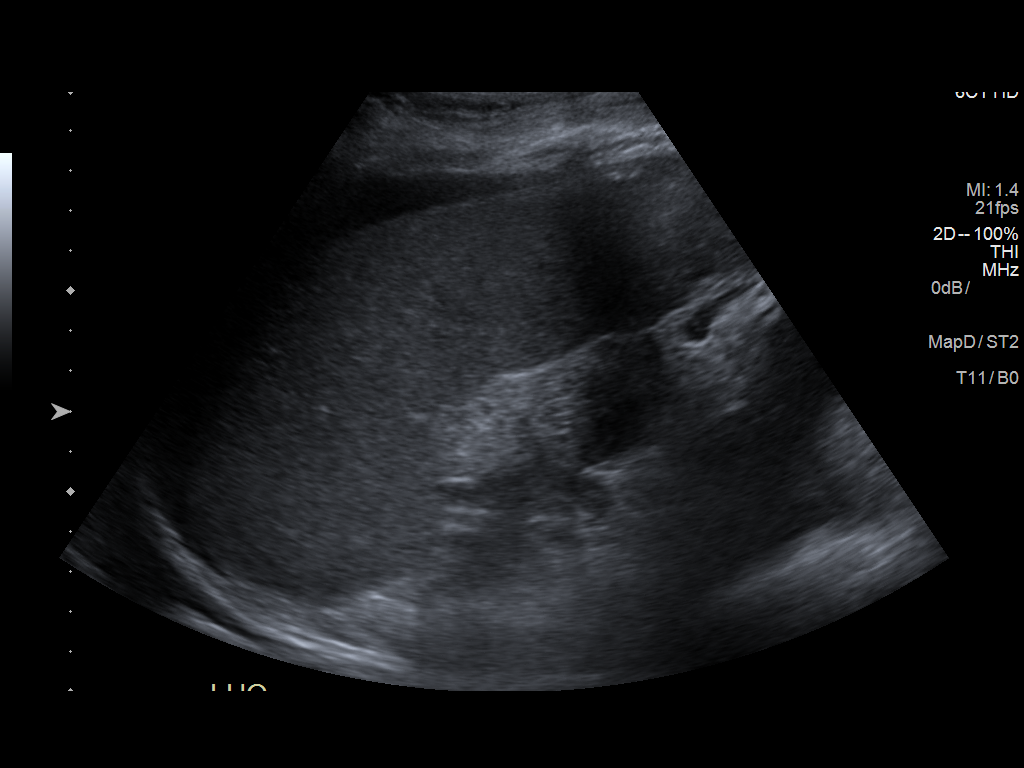
[im 5/7]
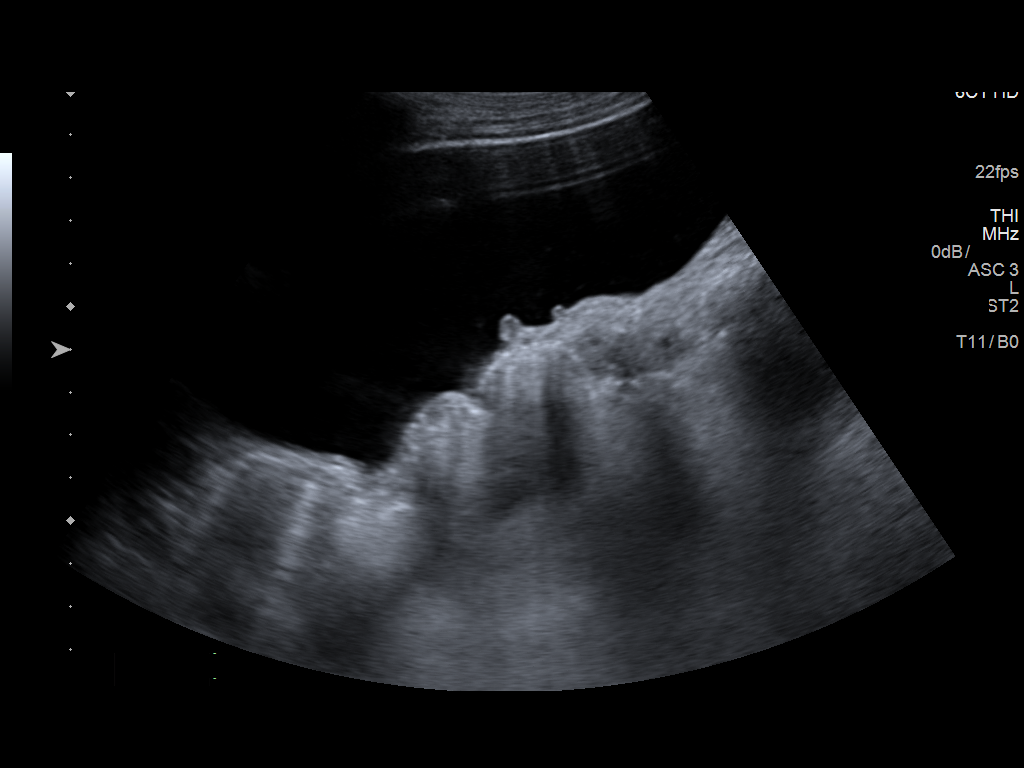
[im 6/7]
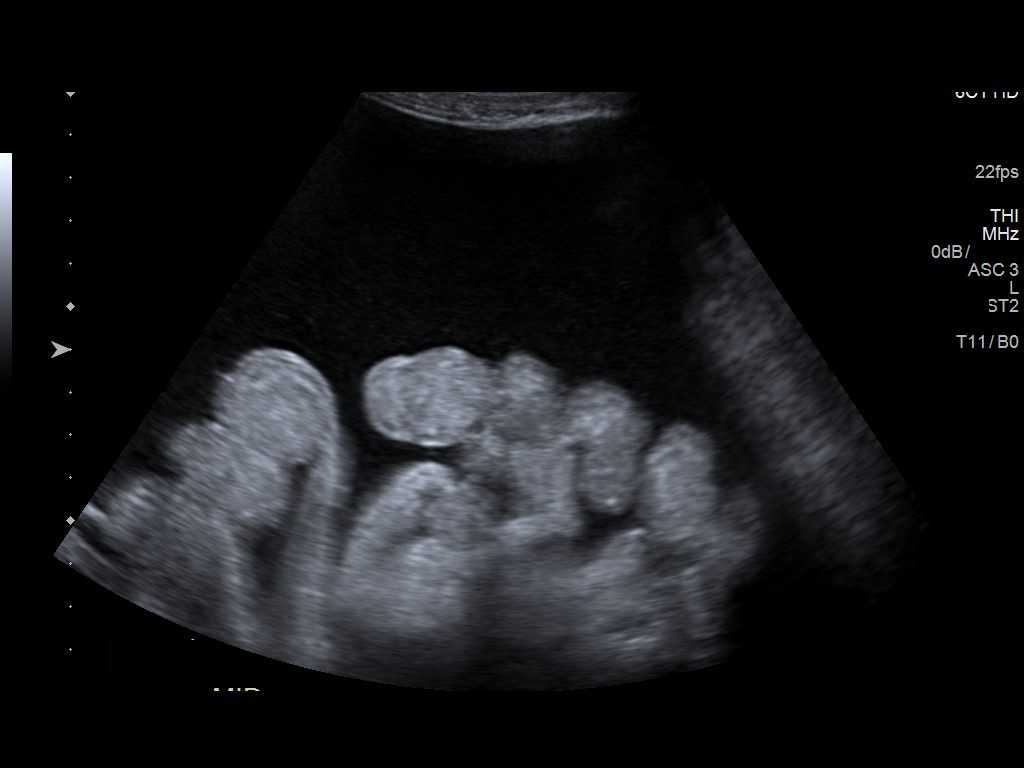
[im 7/7]
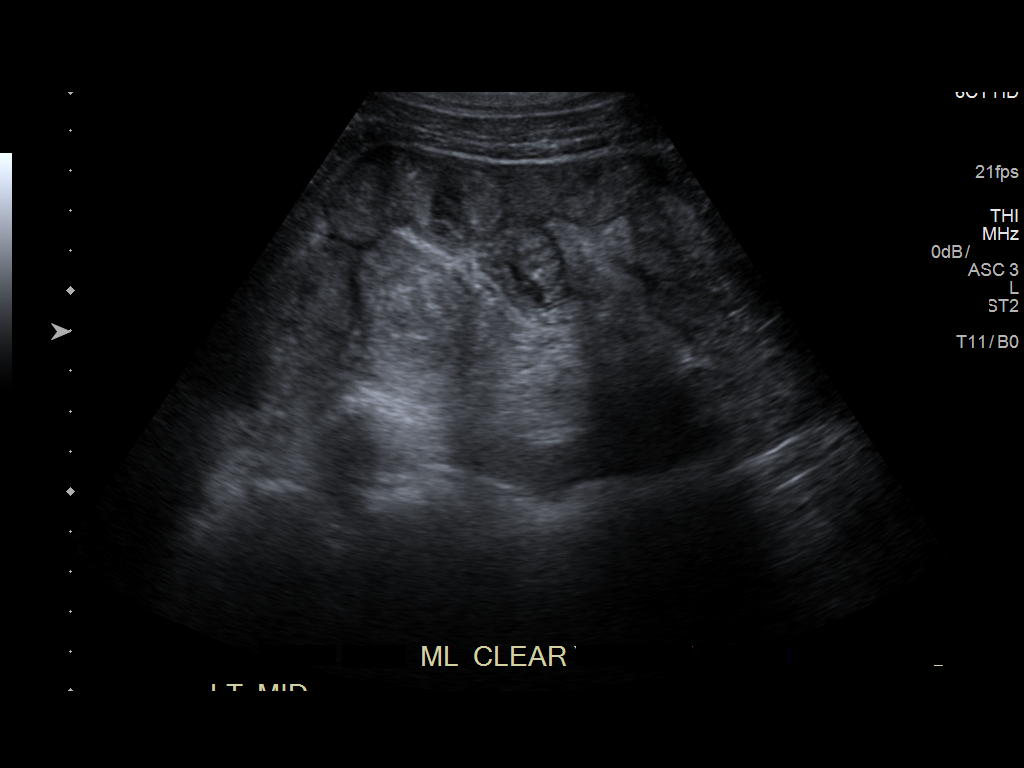

[7 of 7 positions shown; findings below may reference images not displayed]

EXAM:
ULTRASOUND GUIDED PARACENTESIS

MEDICATIONS:
1% lidocaine local

COMPLICATIONS:
None immediate.

PROCEDURE:
An ultrasound guided paracentesis was thoroughly discussed with the
patient and questions answered. The benefits, risks, alternatives
and complications were also discussed. The patient understands and
wishes to proceed with the procedure. Written consent was obtained.

Ultrasound was performed to localize and mark an adequate pocket of
fluid in the left lower quadrant of the abdomen. The area was then
prepped and draped in the normal sterile fashion. 1% Lidocaine was
used for local anesthesia. Under ultrasound guidance a 19 gauge Yueh
catheter was introduced. Paracentesis was performed. The catheter
was removed and a dressing applied.
FINDINGS: A total of approximately 9.6 L of clear peritoneal fluid was
removed. A fluid sample was sent for laboratory analysis.
IMPRESSION: Successful ultrasound guided paracentesis yielding 9.6 L of ascites.

## 2019-12-25 IMAGING — CR DG CHEST 2V
1 series · 2 of 2 positions shown · non-contrast
Comparison: 01/16/2018

CLINICAL DATA: Shortness of breath

EXAM:
CHEST - 2 VIEW

[Series 1: dg chest 2 view · 0.14mm/px · 2 of 2 slices shown]
[im 1/2]
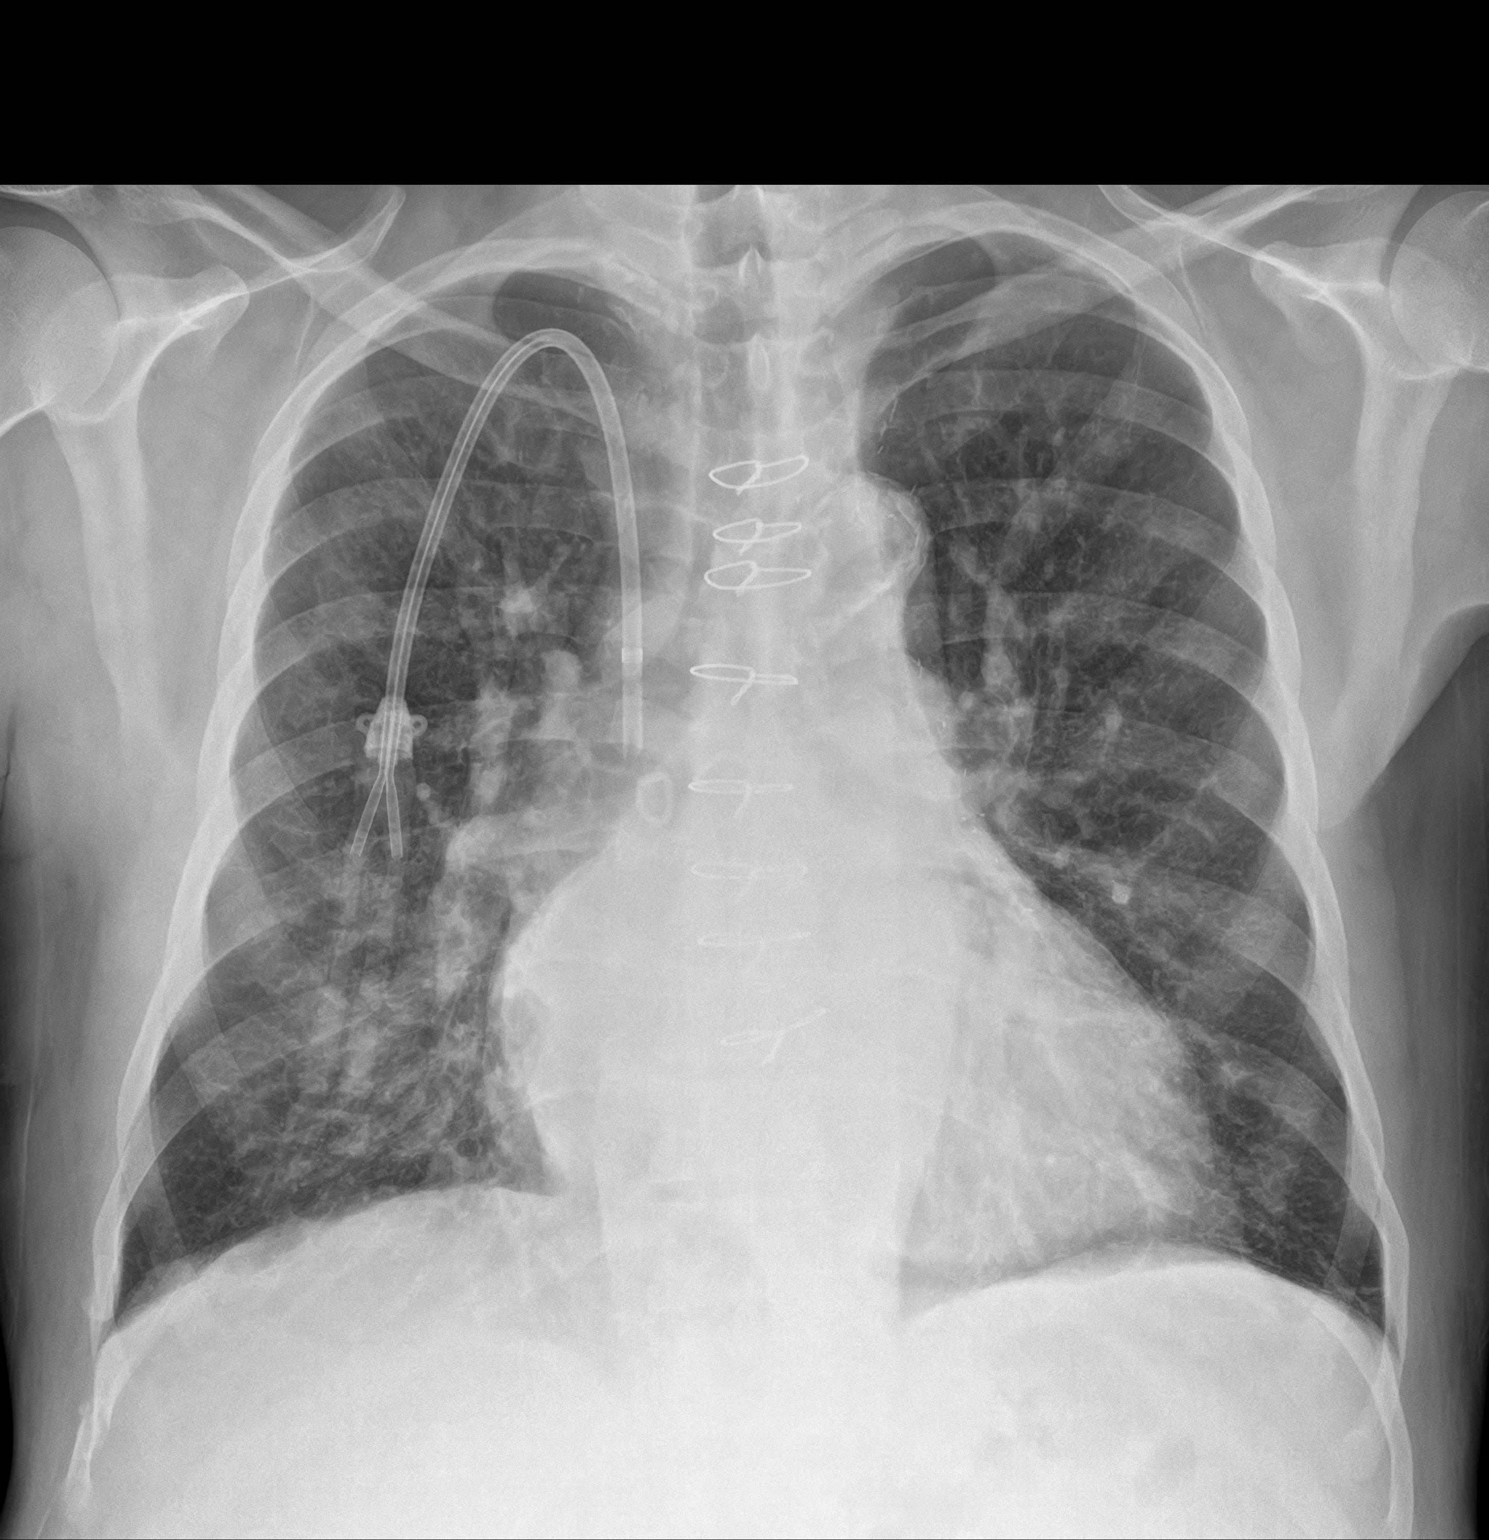
[im 2/2]
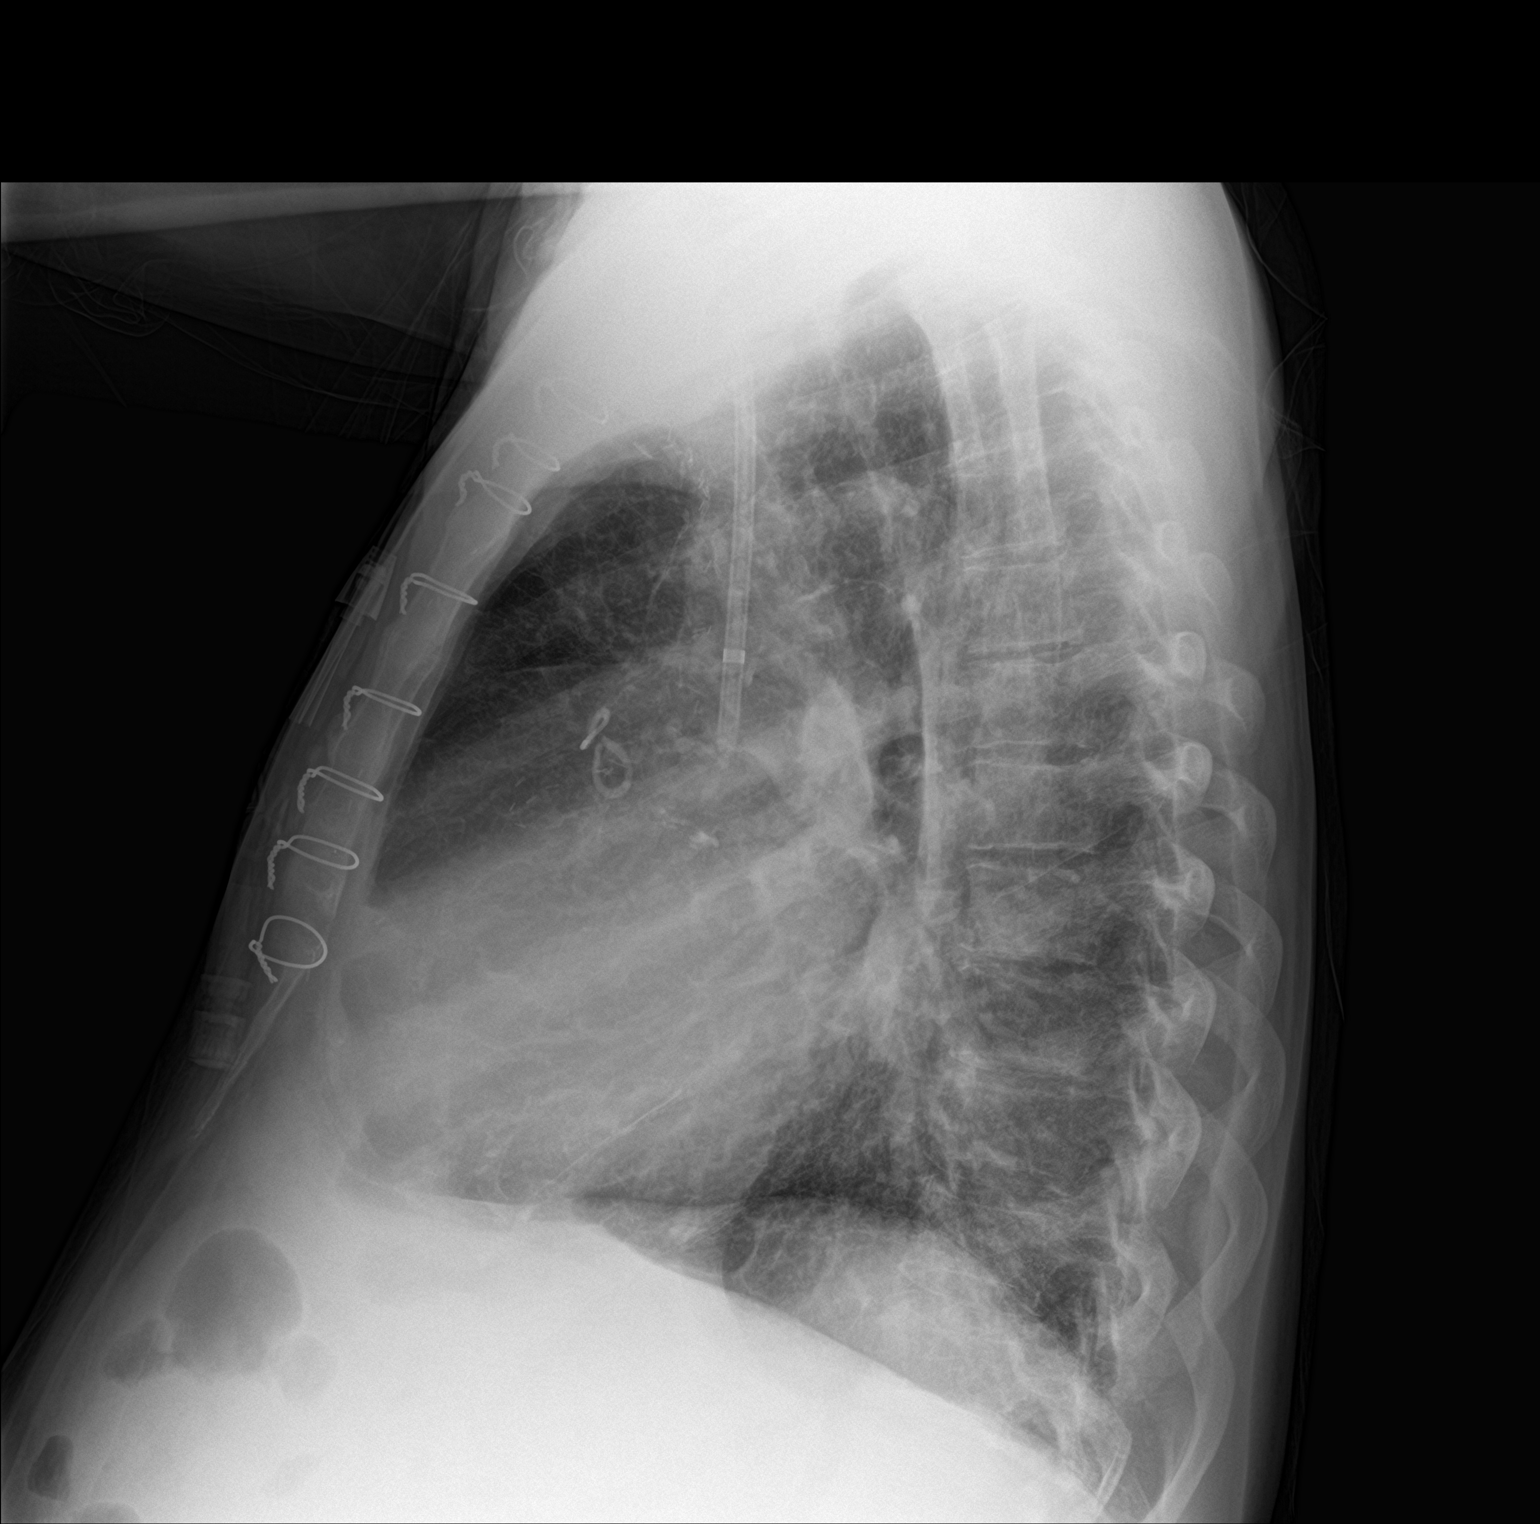

[2 of 2 positions shown; findings below may reference images not displayed]

FINDINGS: Mild bilateral diffuse interstitial thickening. No pleural effusion
or pneumothorax. No focal consolidation. Stable cardiomegaly. Dual
lumen right-sided central venous catheter with the tip projecting
over the SVC. Prior median sternotomy. No acute osseous abnormality.
IMPRESSION: Cardiomegaly with mild pulmonary vascular congestion.

## 2019-12-25 IMAGING — CT CT CHEST W/O CM
2 of 3 series · 15 of 36 positions shown, 18 images · non-contrast
Comparison: Chest x-ray January 29, 2018. Chest CT June 03, 2017.

CLINICAL DATA: Difficulty breathing.  Swelling and face.

EXAM:
CT CHEST WITHOUT CONTRAST
TECHNIQUE: Multidetector CT imaging of the chest was performed following the
standard protocol without IV contrast.

[Series 2: thorax · axial · 0.70mm/px · z∈[-1058,-758]mm · 12 of 178 slices shown, 15 images]
[im 14/178  mediastinal]
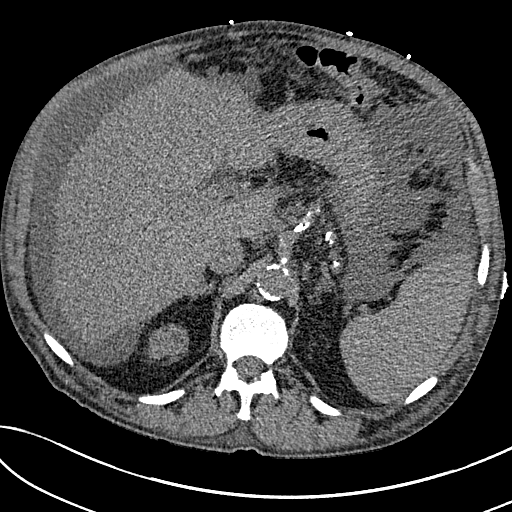
[im 14/178  lung]
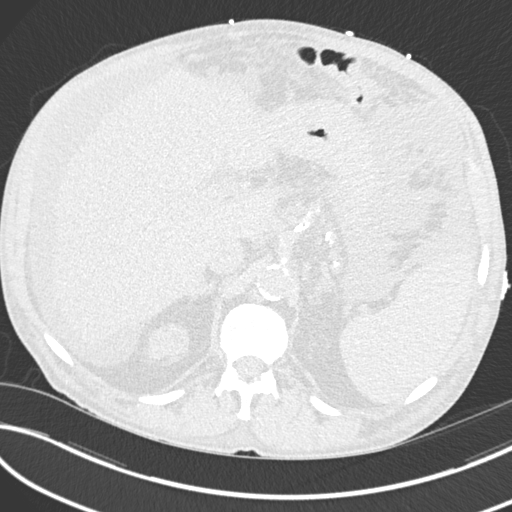
[im 27/178  lung]
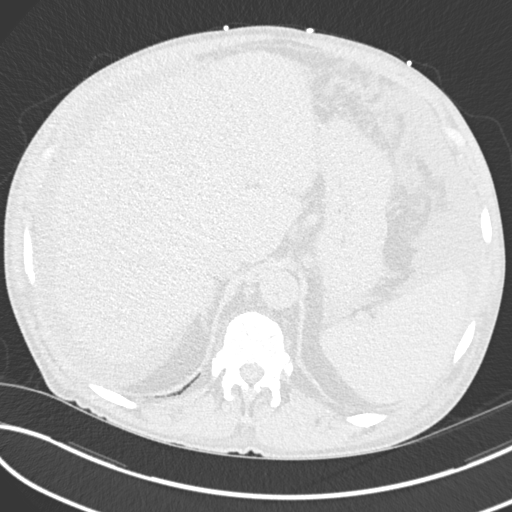
[im 40/178  lung]
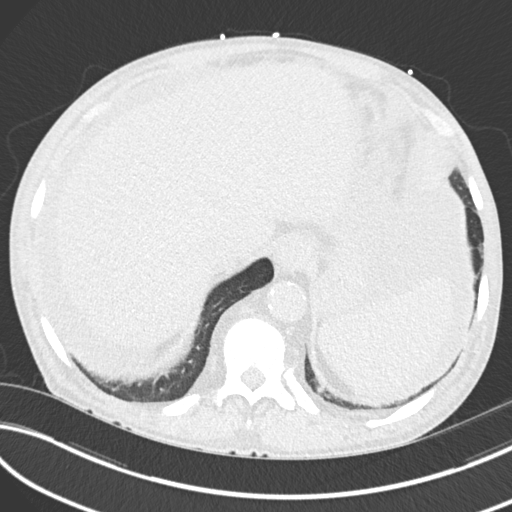
[im 53/178  lung]
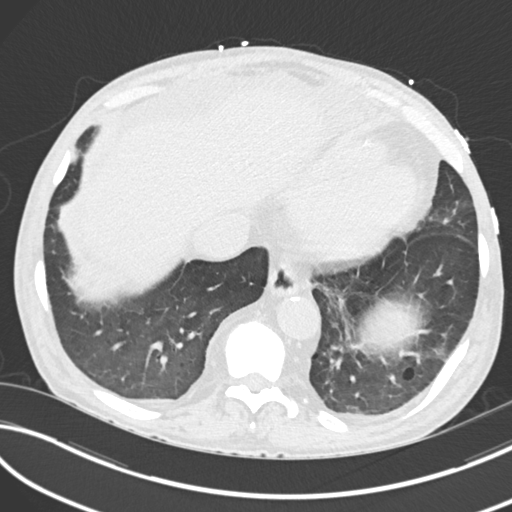
[im 66/178  mediastinal]
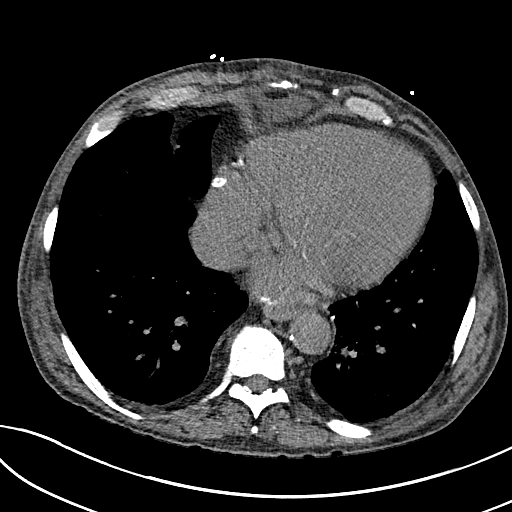
[im 66/178  lung]
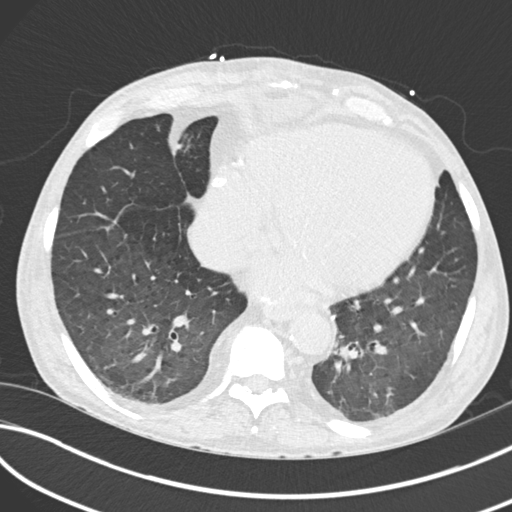
[im 79/178  lung]
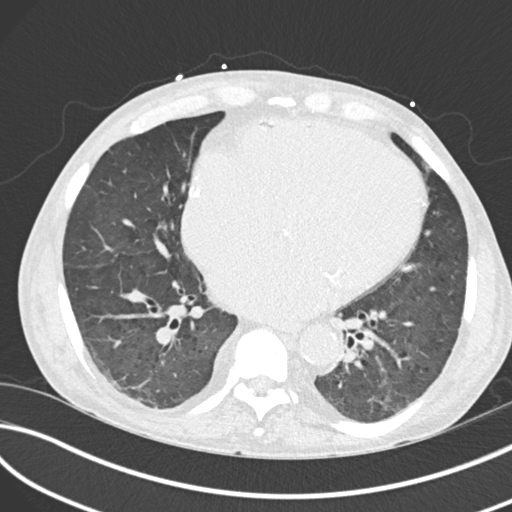
[im 99/178  lung]
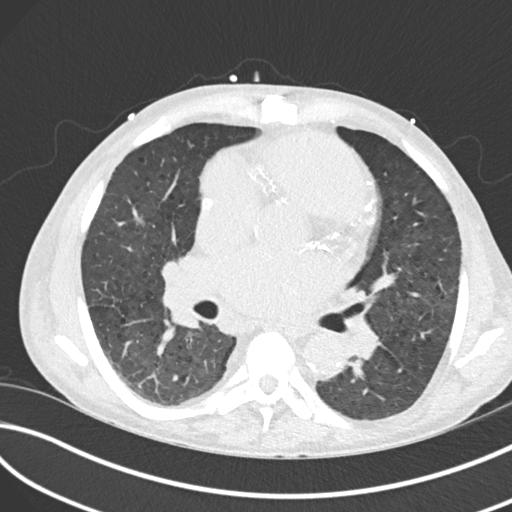
[im 112/178  lung]
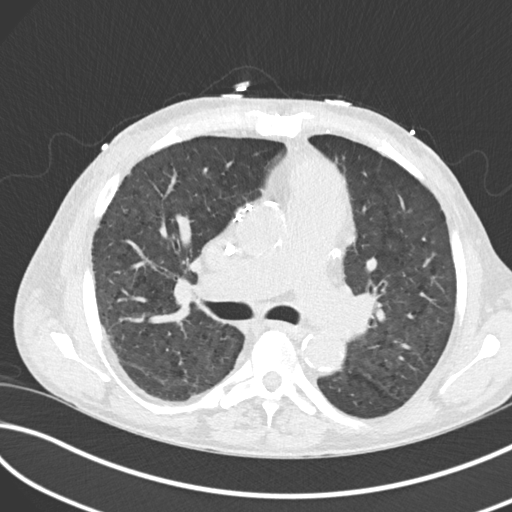
[im 125/178  mediastinal]
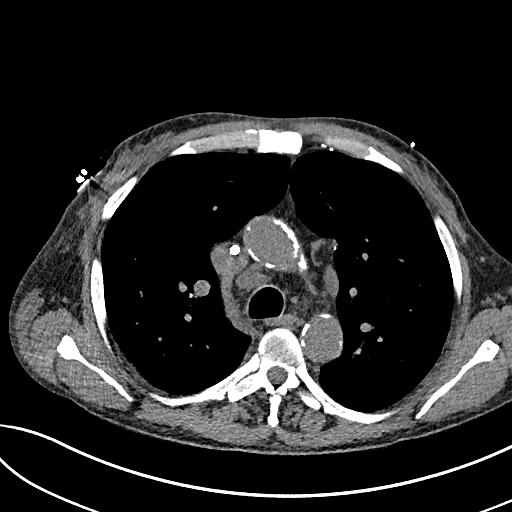
[im 125/178  lung]
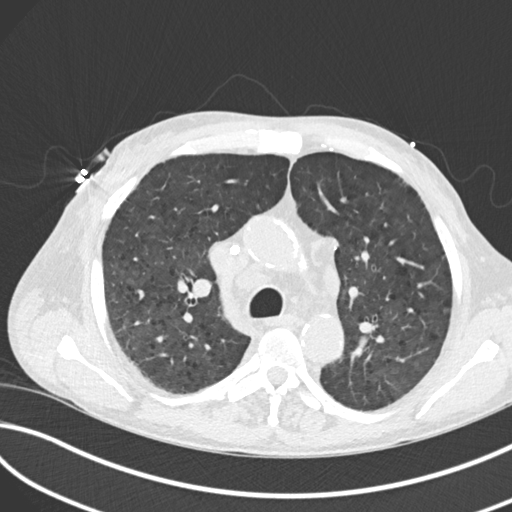
[im 138/178  lung]
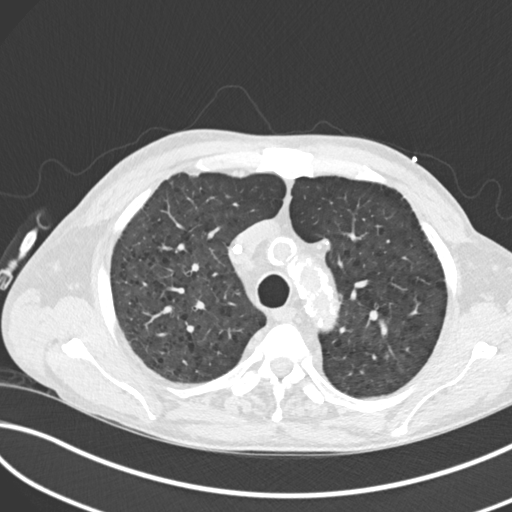
[im 151/178  lung]
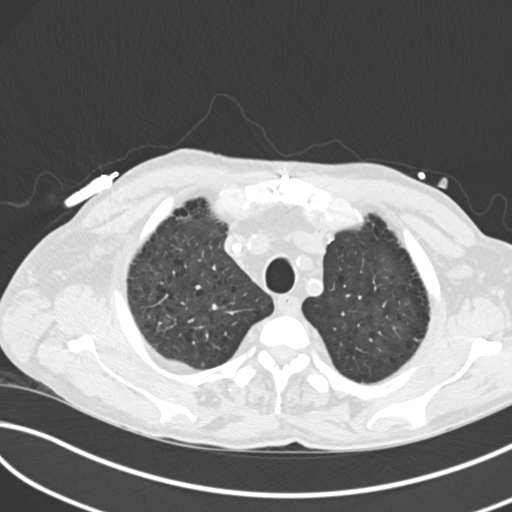
[im 164/178  lung]
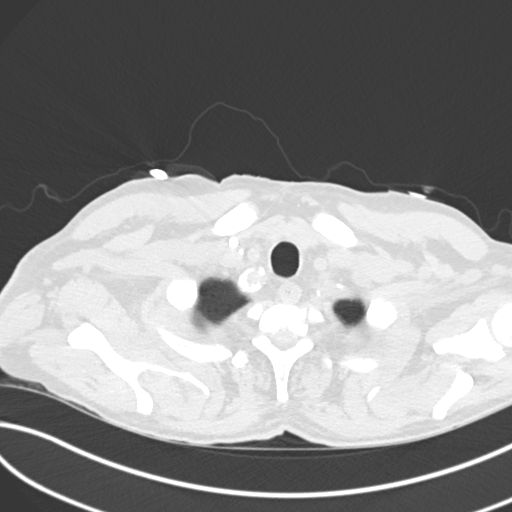

[Series 5: coronal · coronal · 0.67mm/px · 3 of 148 slices shown]
[im 30/148  lung]
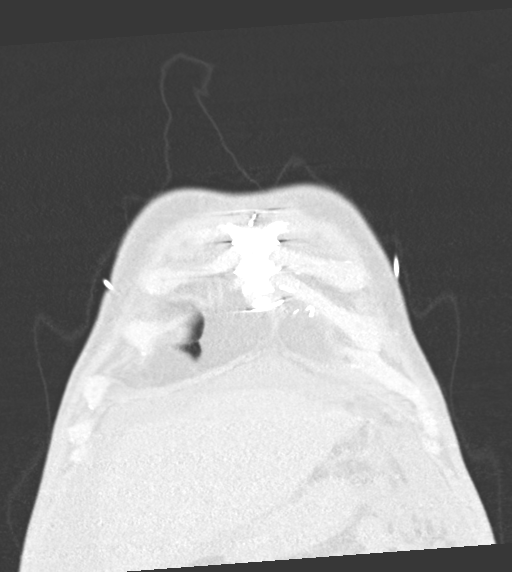
[im 59/148  lung]
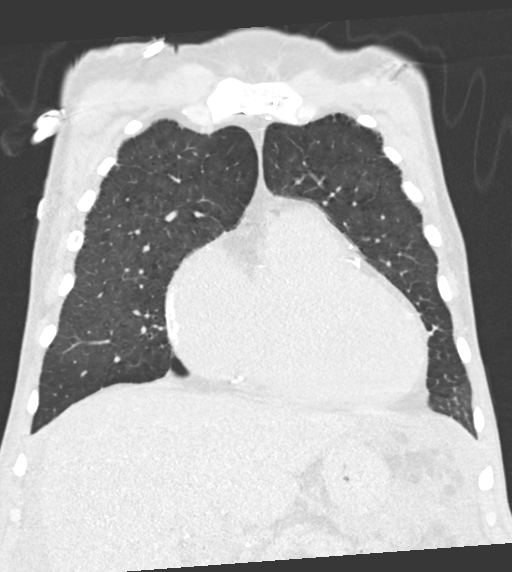
[im 89/148  lung]
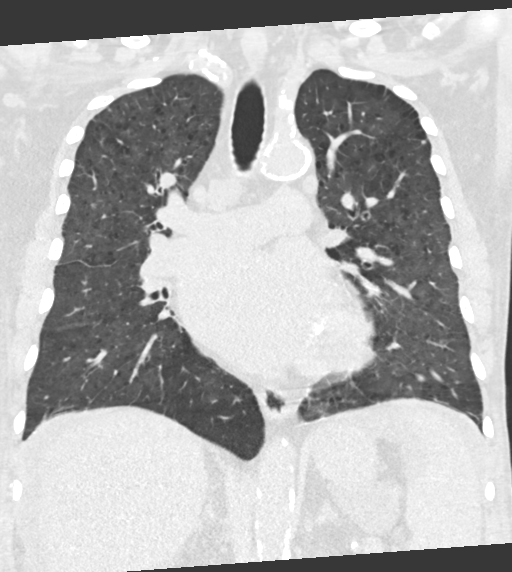

[15 of 36 positions shown; findings below may reference images not displayed]

FINDINGS: Cardiovascular: Cardiomegaly. Coronary artery calcifications.
Atherosclerotic changes are seen in the nonaneurysmal thoracic
aorta. The main pulmonary artery measures 4 cm on series 2, image
67.

Mediastinum/Nodes: The thyroid is normal. A central line terminates
in the central SVC. A prominent node anterior to the right-sided
trachea on series 2, image 57 measures 14 mm. A few other mildly
prominent and enlarged nodes are noted in the mediastinum. The
esophagus is unremarkable. No effusions.

Lungs/Pleura: Central airways are normal. No pneumothorax.
Emphysematous changes are seen in the lungs. No overt edema. No
masses or suspicious infiltrates. A few small scattered pulmonary
nodules are noted. A representative nodule in the left upper lobe
measures 5 mm. Several other similar appearing small nodules are
seen in the lungs bilaterally.

Upper Abdomen: A nodular liver is consistent with cirrhosis. Ascites
is identified in the upper abdomen. The kidneys are relatively
atrophic.

Musculoskeletal: No chest wall mass or suspicious bone lesions
identified.
IMPRESSION: 1. The liver demonstrates a mildly nodular contour suggesting
cirrhosis. There is ascites in the upper abdomen adjacent to the
liver and spleen.
2. Emphysematous changes in the lungs.
3. Scattered pulmonary nodules with the largest on the left
measuring 5 mm. No follow-up needed if patient is low-risk.
Non-contrast chest CT can be considered in 12 months if patient is
high-risk. This recommendation follows the consensus statement:
Guidelines for Management of Incidental Pulmonary Nodules Detected
[DATE].
4. Coronary artery calcifications. Atherosclerotic changes in the
nonaneurysmal aorta.
5. Cardiomegaly.
6. Dilatation of the main pulmonary artery to 4 cm raising the
possibility of pulmonary arterial hypertension.
7. Prominent mildly enlarged nodes in the mediastinum may be
reactive. Recommend attention on follow-up.

Aortic Atherosclerosis (4FZP6-FPX.X) and Emphysema ([id number].C).

## 2019-12-26 IMAGING — US US PARACENTESIS
1 series · 5 of 5 positions shown · non-contrast
Comparison: Multiple previous ultrasound-guided paracenteses most
recently on 01/18/2019

MEDICATIONS:
None.

COMPLICATIONS:
None immediate.

INDICATION: Recurrent symptomatic ascites. Please perform ultrasound
paracentesis for diagnostic purposes.

EXAM:
ULTRASOUND-GUIDED PARACENTESIS
TECHNIQUE: Informed written consent was obtained from the patient after a
discussion of the risks, benefits and alternatives to treatment. A
timeout was performed prior to the initiation of the procedure.

[Series 1: us paracentesis · 5 of 5 slices shown]
[im 1/5]
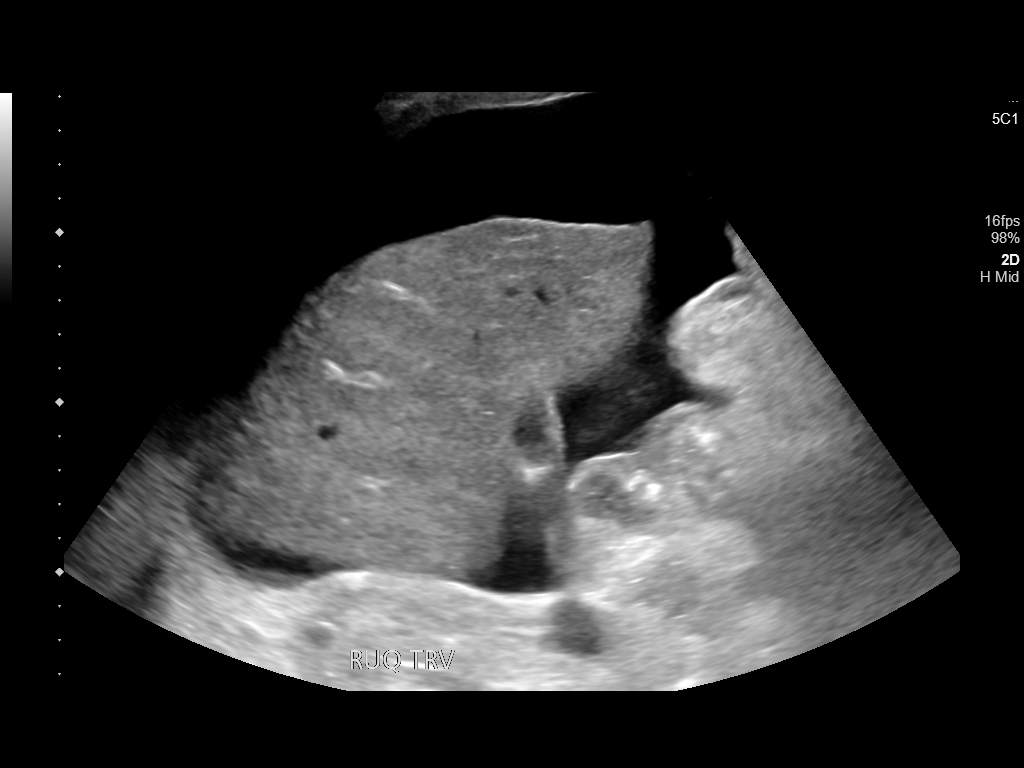
[im 2/5]
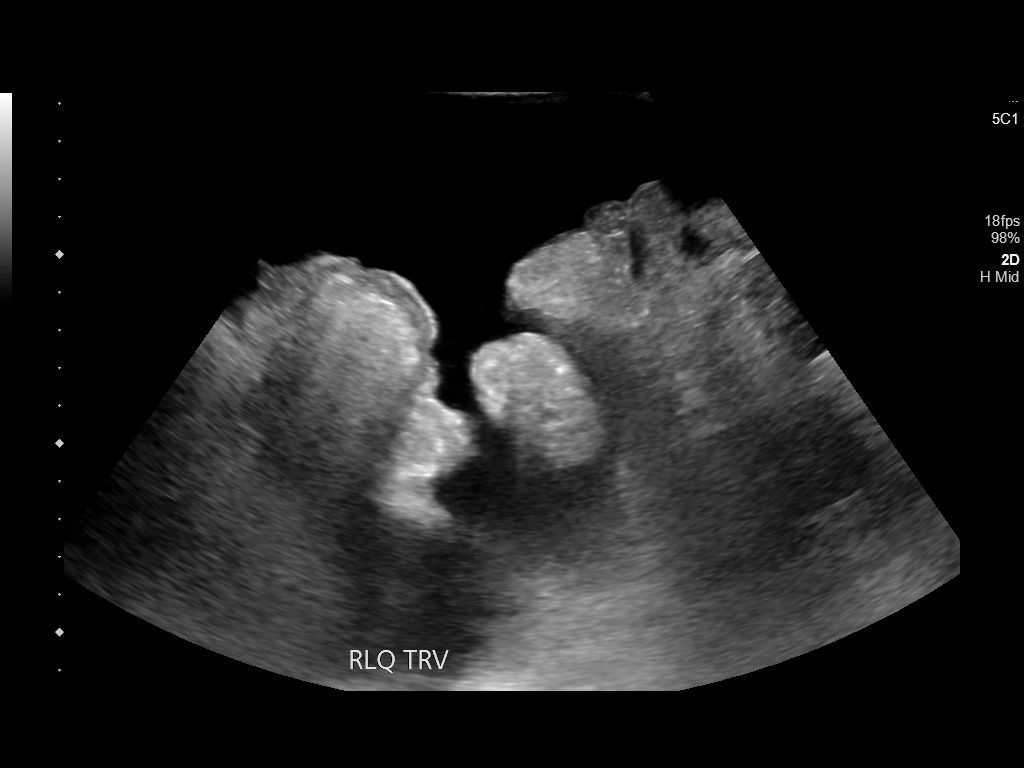
[im 3/5]
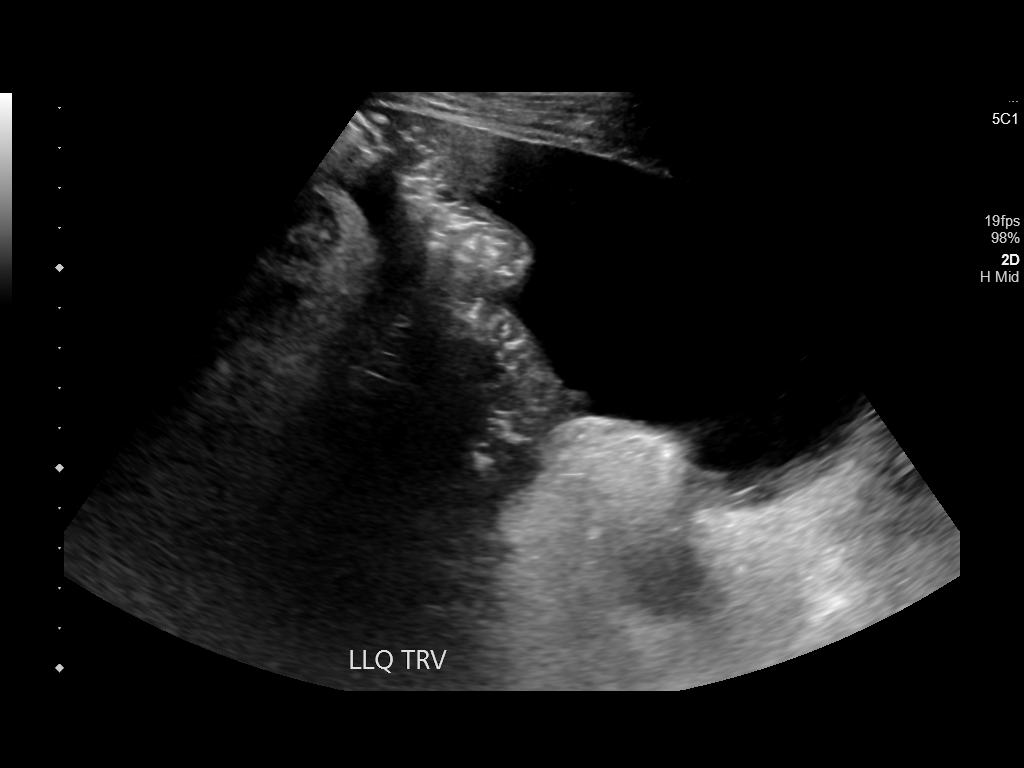
[im 4/5]
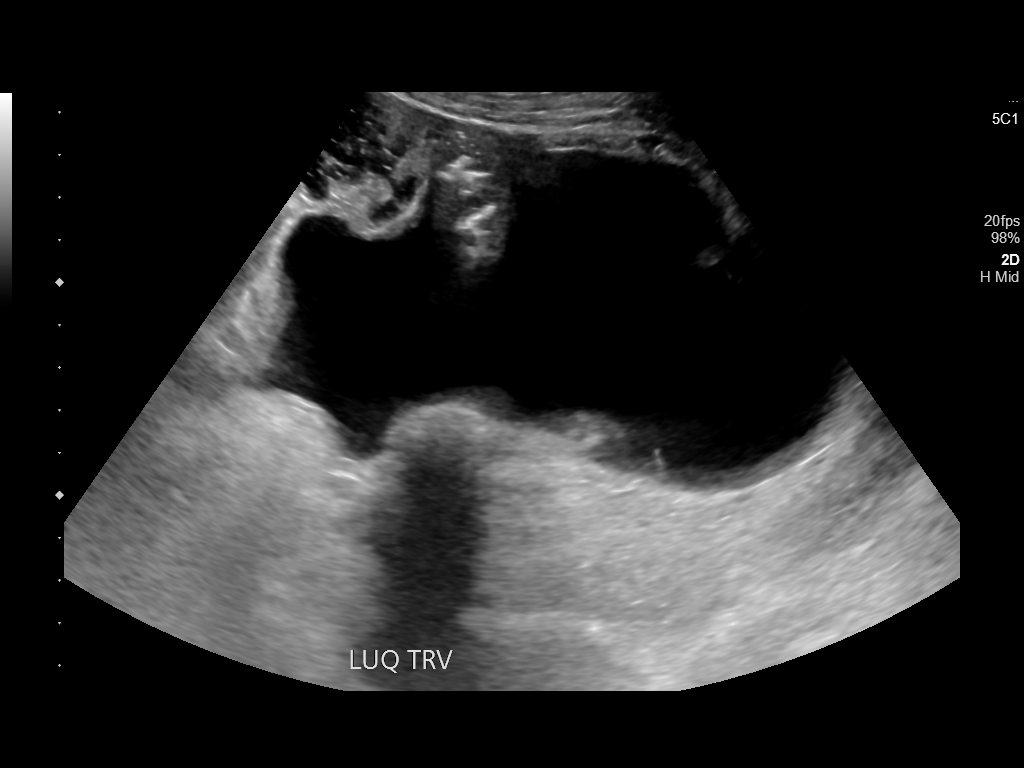
[im 5/5]
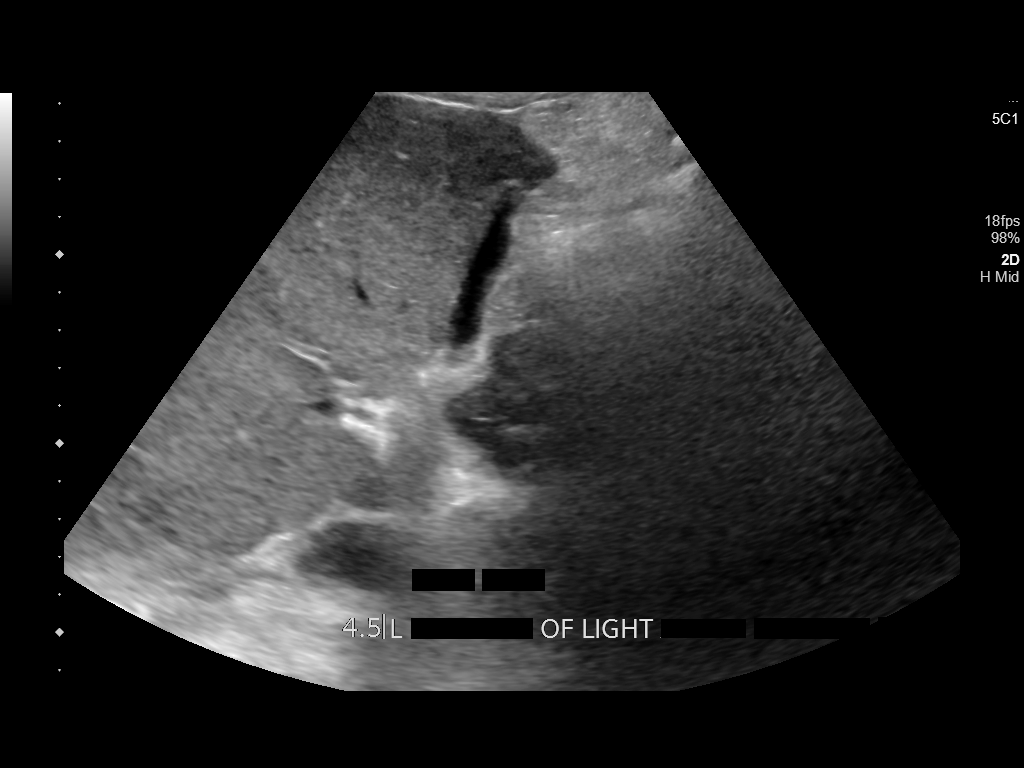

[5 of 5 positions shown; findings below may reference images not displayed]

Initial ultrasound scanning demonstrates a moderate amount of
ascites within the right lower abdominal quadrant. The right lower
abdomen was prepped and draped in the usual sterile fashion. 1%
lidocaine with epinephrine was used for local anesthesia. An
ultrasound image was saved for documentation purposed. An 8 Fr
Safe-T-Centesis catheter was introduced. The paracentesis was
performed. The catheter was removed and a dressing was applied. The
patient tolerated the procedure well without immediate post
procedural complication.
FINDINGS: A total of approximately 4.5 liters of serous fluid was removed.
IMPRESSION: Successful ultrasound-guided paracentesis yielding 4.5 liters of
peritoneal fluid.

## 2020-01-05 IMAGING — DX DG CHEST 1V PORT
1 series · 1 of 1 positions shown · non-contrast
Comparison: May 17, 2017

CLINICAL DATA: Vomiting.

EXAM:
PORTABLE CHEST 1 VIEW

[chest ap]
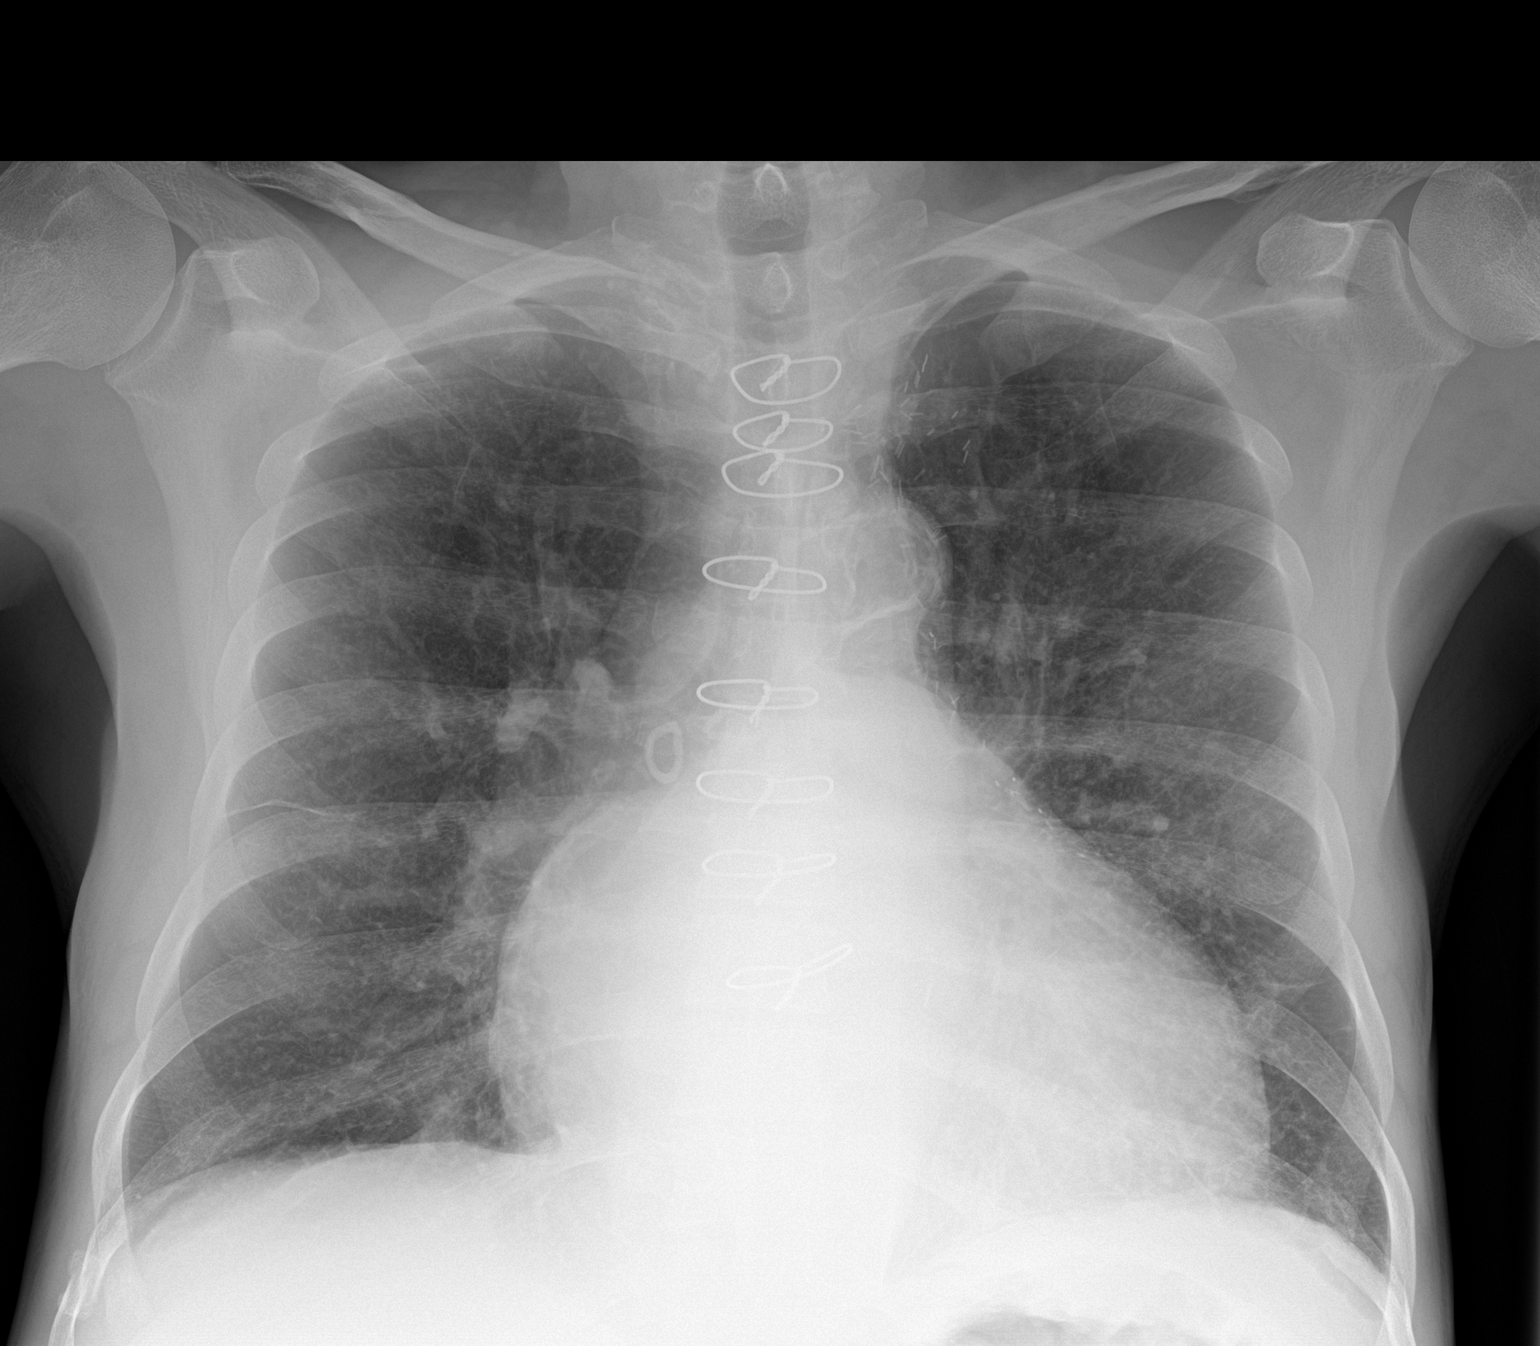

[1 of 1 positions shown; findings below may reference images not displayed]

FINDINGS: Cardiomegaly, stable. The hila and mediastinum are unchanged. No
pneumothorax. No pulmonary nodules or masses. No focal infiltrates.
IMPRESSION: No active disease.

## 2020-01-05 IMAGING — CT CT ANGIO CHEST
2 of 6 series · 18 of 46 positions shown · IV contrast (APPLIED)
Comparison: Plain film from 06/03/2017, CT from 03/20/16

CLINICAL DATA: Hypoxia

EXAM:
CT ANGIOGRAPHY CHEST WITH CONTRAST
TECHNIQUE: Multidetector CT imaging of the chest was performed using the
standard protocol during bolus administration of intravenous
contrast. Multiplanar CT image reconstructions and MIPs were
obtained to evaluate the vascular anatomy.
CONTRAST:  75mL [id number] IOPAMIDOL ([id number]) INJECTION 76%

[Series 5: thins · axial · 0.73mm/px · z∈[-402,-100]mm · 16 of 333 slices shown]
[im 15/333  lung]
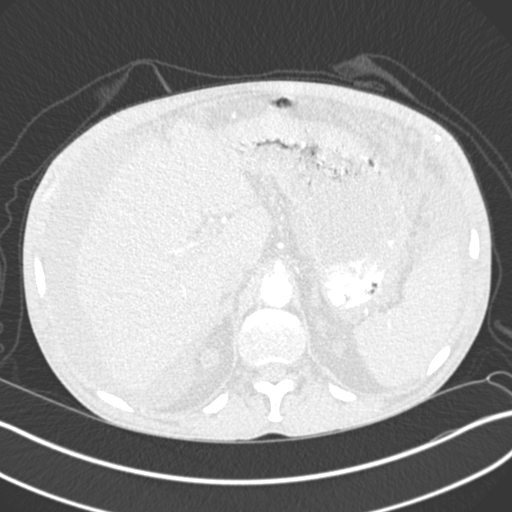
[im 44/333  soft-tissue]
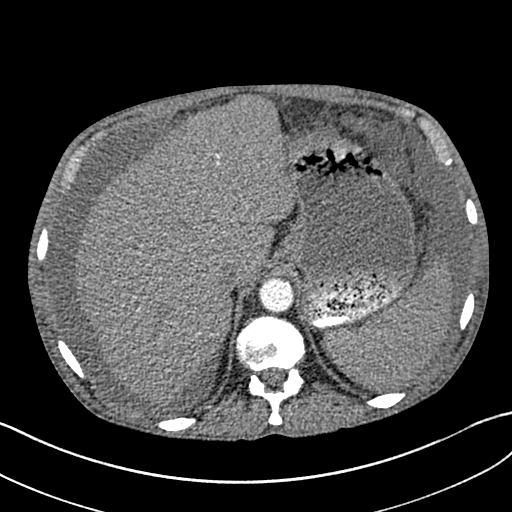
[im 58/333  lung]
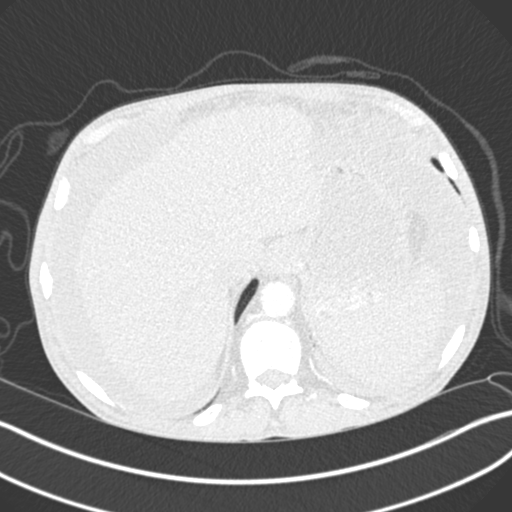
[im 73/333  soft-tissue]
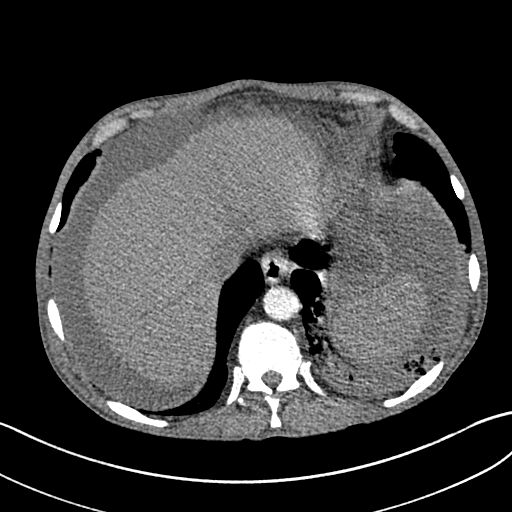
[im 102/333  lung]
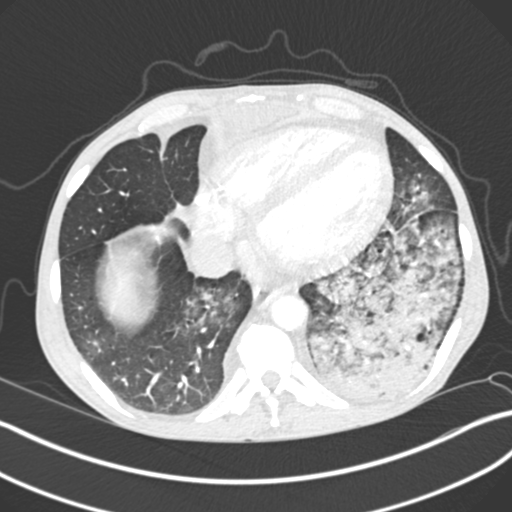
[im 116/333  soft-tissue]
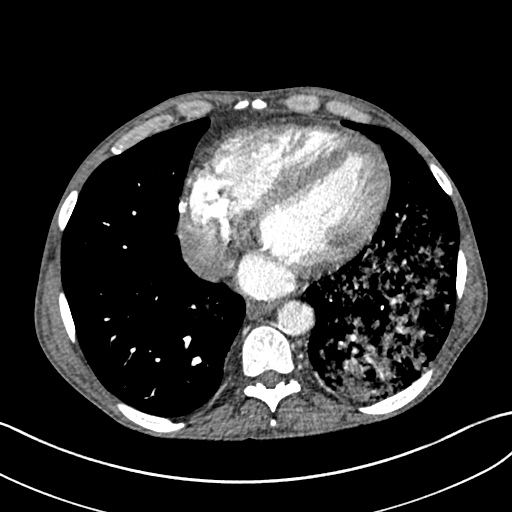
[im 130/333  lung]
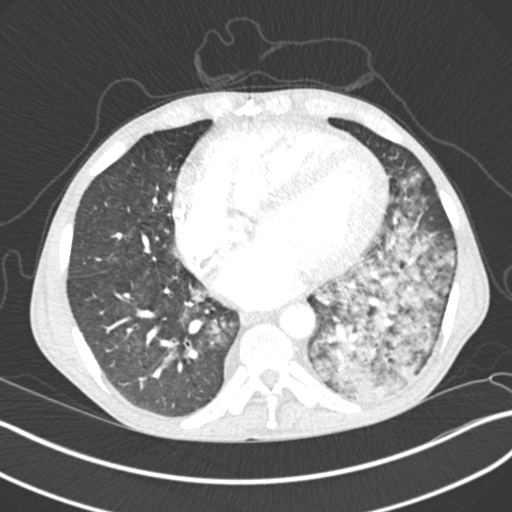
[im 159/333  soft-tissue]
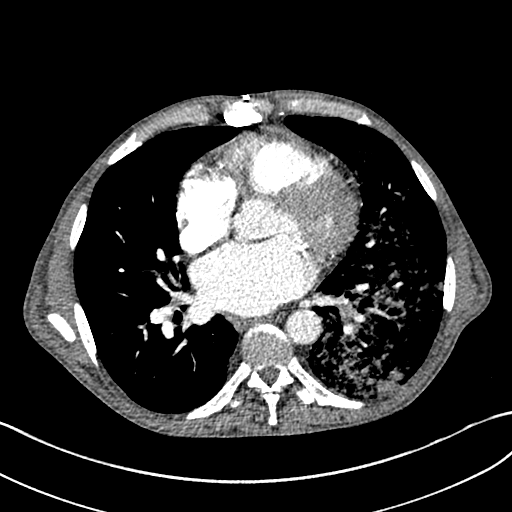
[im 174/333  lung]
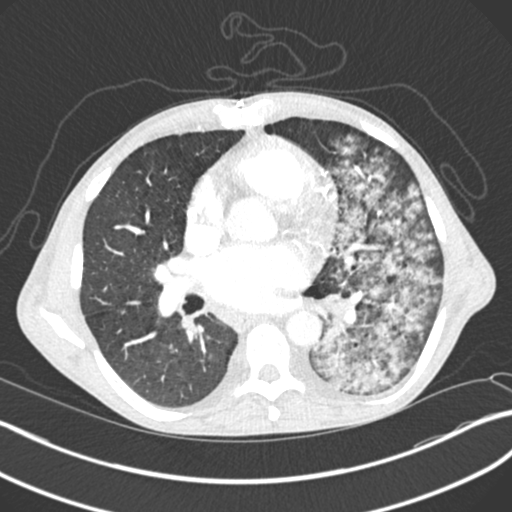
[im 203/333  soft-tissue]
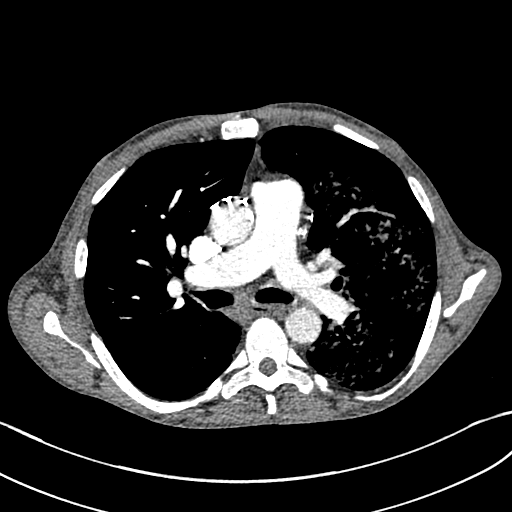
[im 217/333  lung]
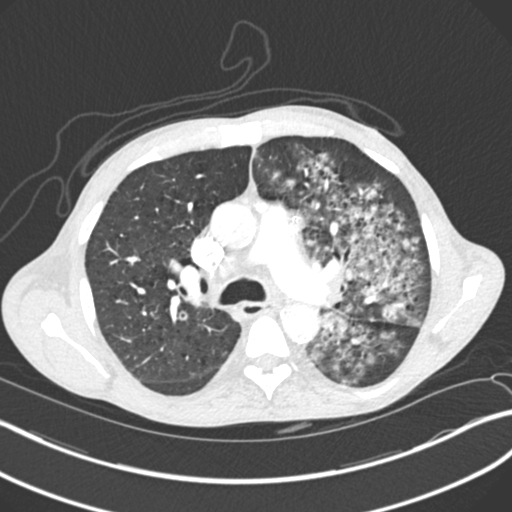
[im 231/333  soft-tissue]
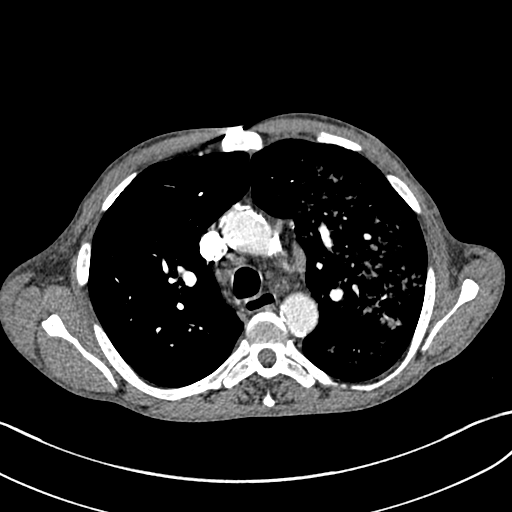
[im 260/333  lung]
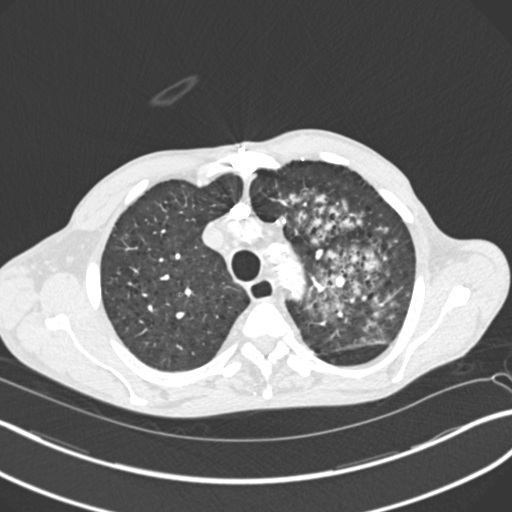
[im 275/333  soft-tissue]
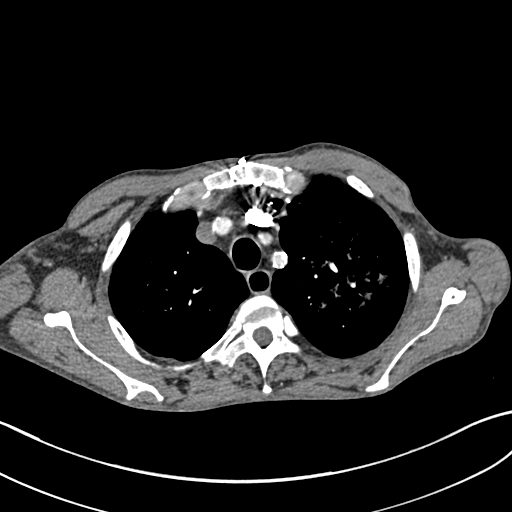
[im 289/333  lung]
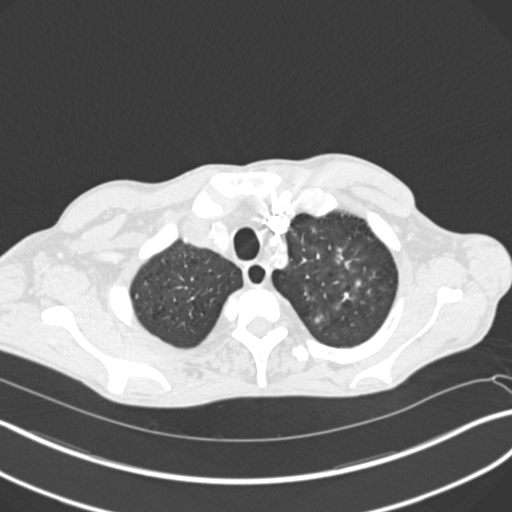
[im 318/333  soft-tissue]
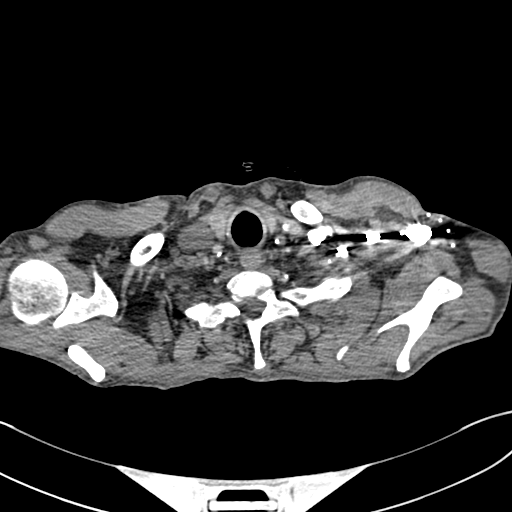

[Series 7: coronal mpr · coronal · 0.65mm/px · 2 of 89 slices shown]
[im 30/89  soft-tissue]
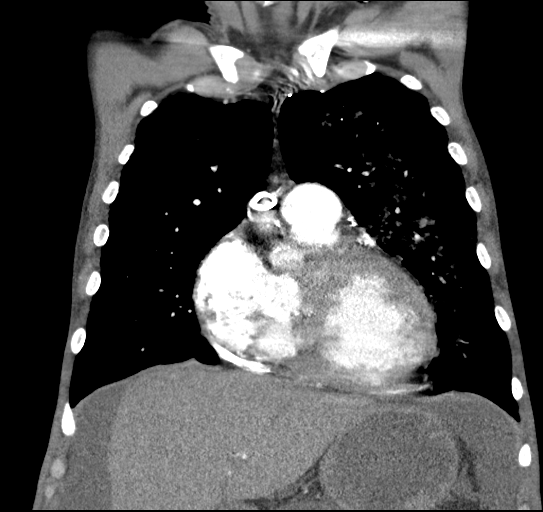
[im 59/89  soft-tissue]
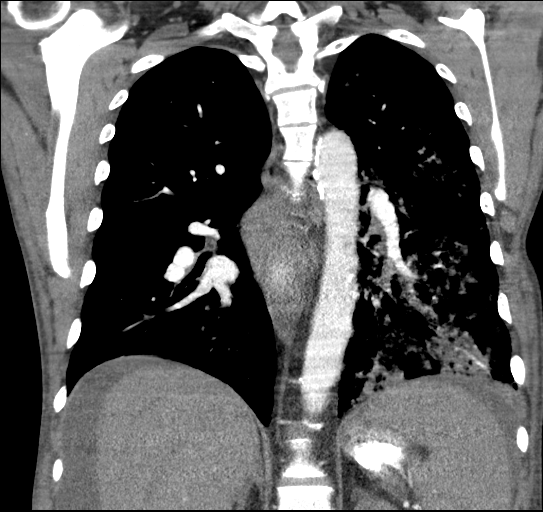

[18 of 46 positions shown; findings below may reference images not displayed]

FINDINGS: Cardiovascular: Thoracic aorta and its branches demonstrate
atherosclerotic calcifications without aneurysmal dilatation or
dissection. No significant cardiac enlargement is noted. Heavy
coronary calcifications are seen. The pulmonary artery is well
visualized with a normal branching pattern. No filling defects to
suggest pulmonary emboli are identified.

Mediastinum/Nodes: Thoracic inlet is within normal limits. No
significant hilar or mediastinal adenopathy is identified. The
esophagus is within normal limits.

Lungs/Pleura: Emphysematous changes are noted in the lungs
bilaterally. The right lung is clear with the exception of some
alveolar infiltrate in the lower lobe. More marked alveolar
infiltrate is noted in the left lower lobe with areas of
consolidation. These changes are also noted but to a slightly lesser
degree in the left upper lobe. These have increased in the interval
from the prior plain film examination today.

Upper Abdomen: Visualized upper abdomen demonstrates cirrhotic
change of the liver with moderate ascites. No other focal
abnormality in the upper abdomen is seen. No evidence of varices are
noted.

Musculoskeletal: Degenerative changes of the thoracic spine are
noted. Old right rib fractures are noted.

Review of the MIP images confirms the above findings.
IMPRESSION: Diffuse left-sided infiltrate slightly greater in the lower lobe
than the upper lobe with minimal infiltrative changes in the right
lower lobe. Given the abrupt change from the recent plain film this
likely represents some asymmetric pulmonary edema. Clinical
correlation is recommended.

Cirrhotic change with evidence of moderate ascites.

No evidence of pulmonary embolism.

Aortic Atherosclerosis ([id number].F) and Emphysema ([id number].5).

## 2020-01-15 IMAGING — CR DG CHEST 2V
2 series · 2 of 2 positions shown · non-contrast
Comparison: CT 01/29/2018, radiographs 01/29/2018, 01/16/2018
01/15/2018, 12/26/2017

CLINICAL DATA: Shortness of breath

EXAM:
CHEST - 2 VIEW

[chest pa]
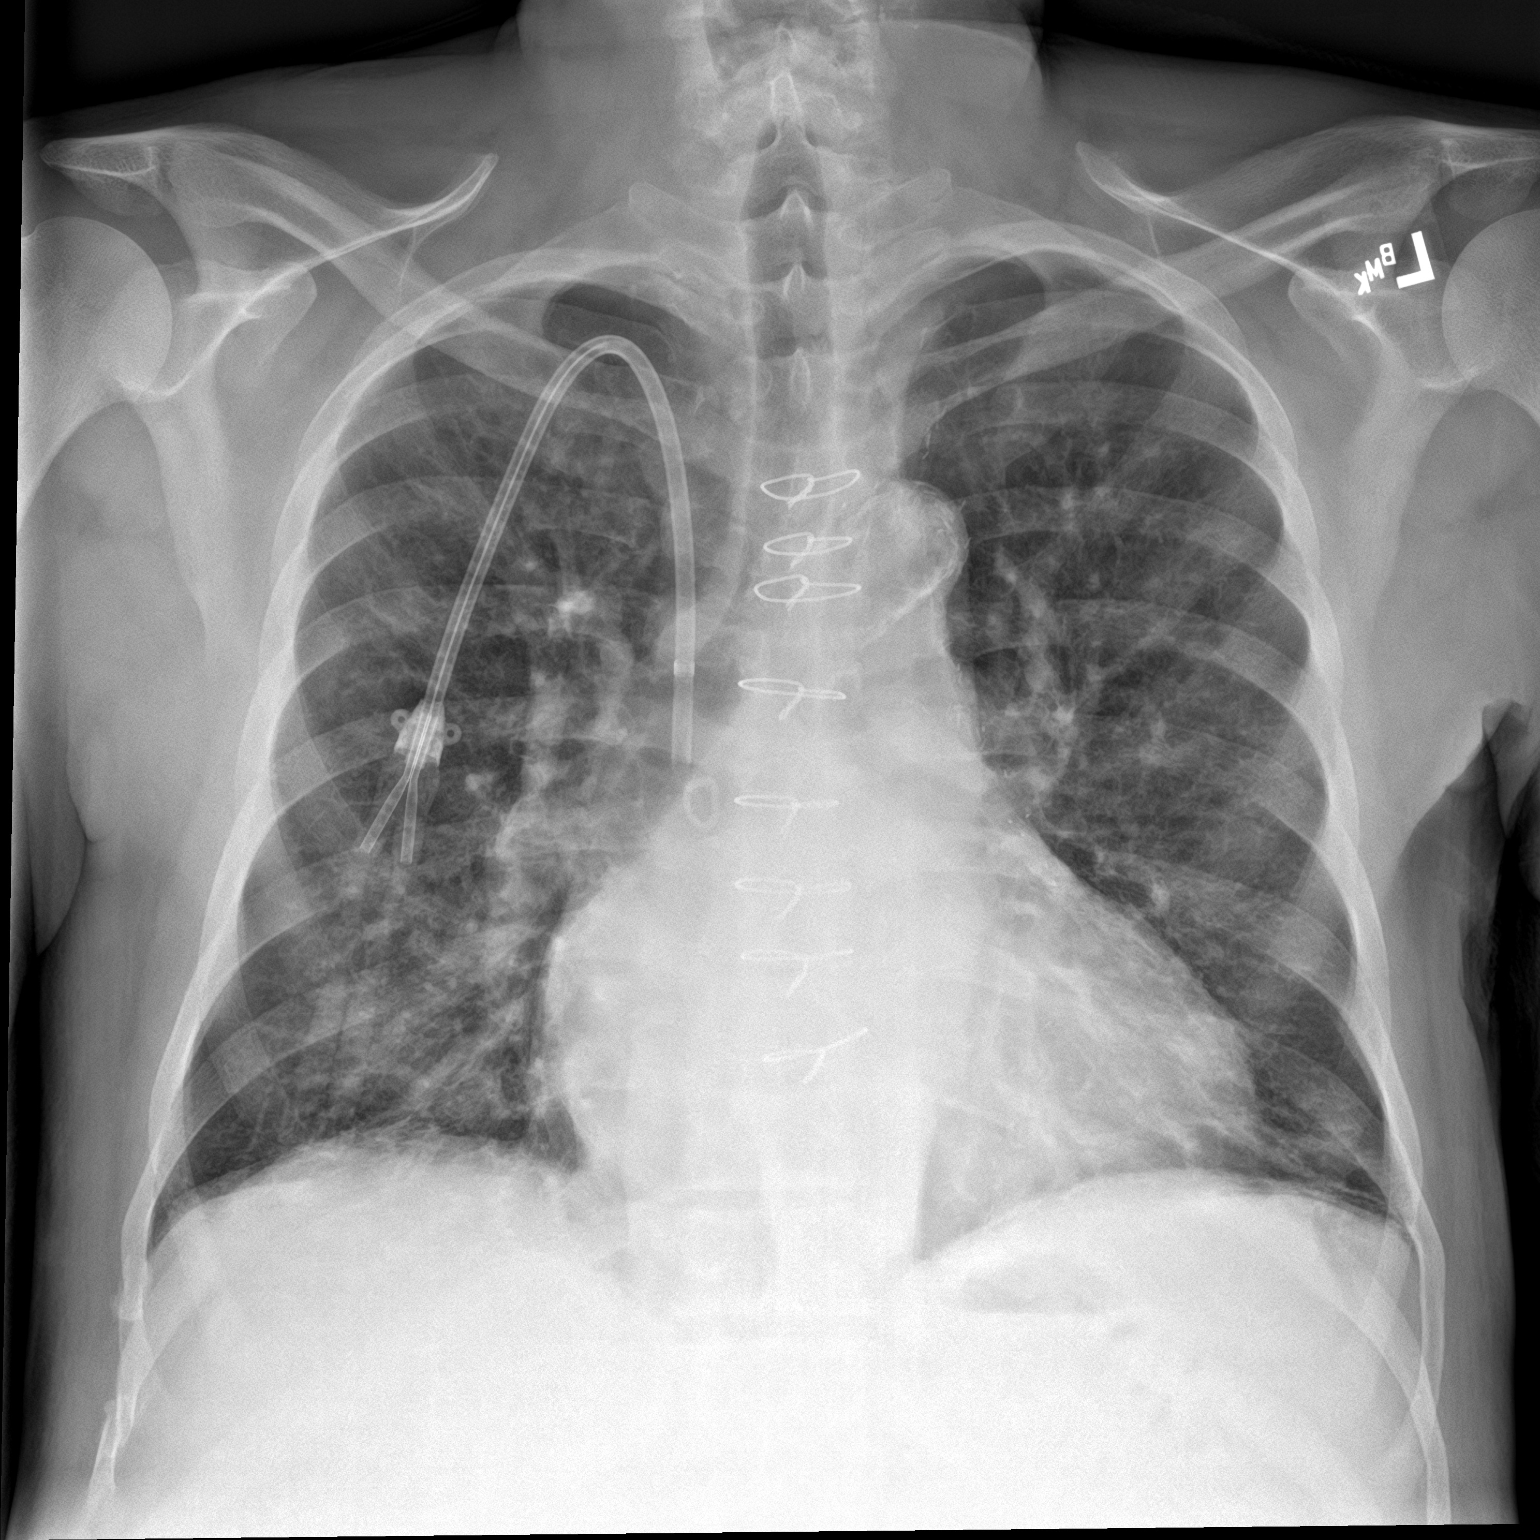

[chest lat]
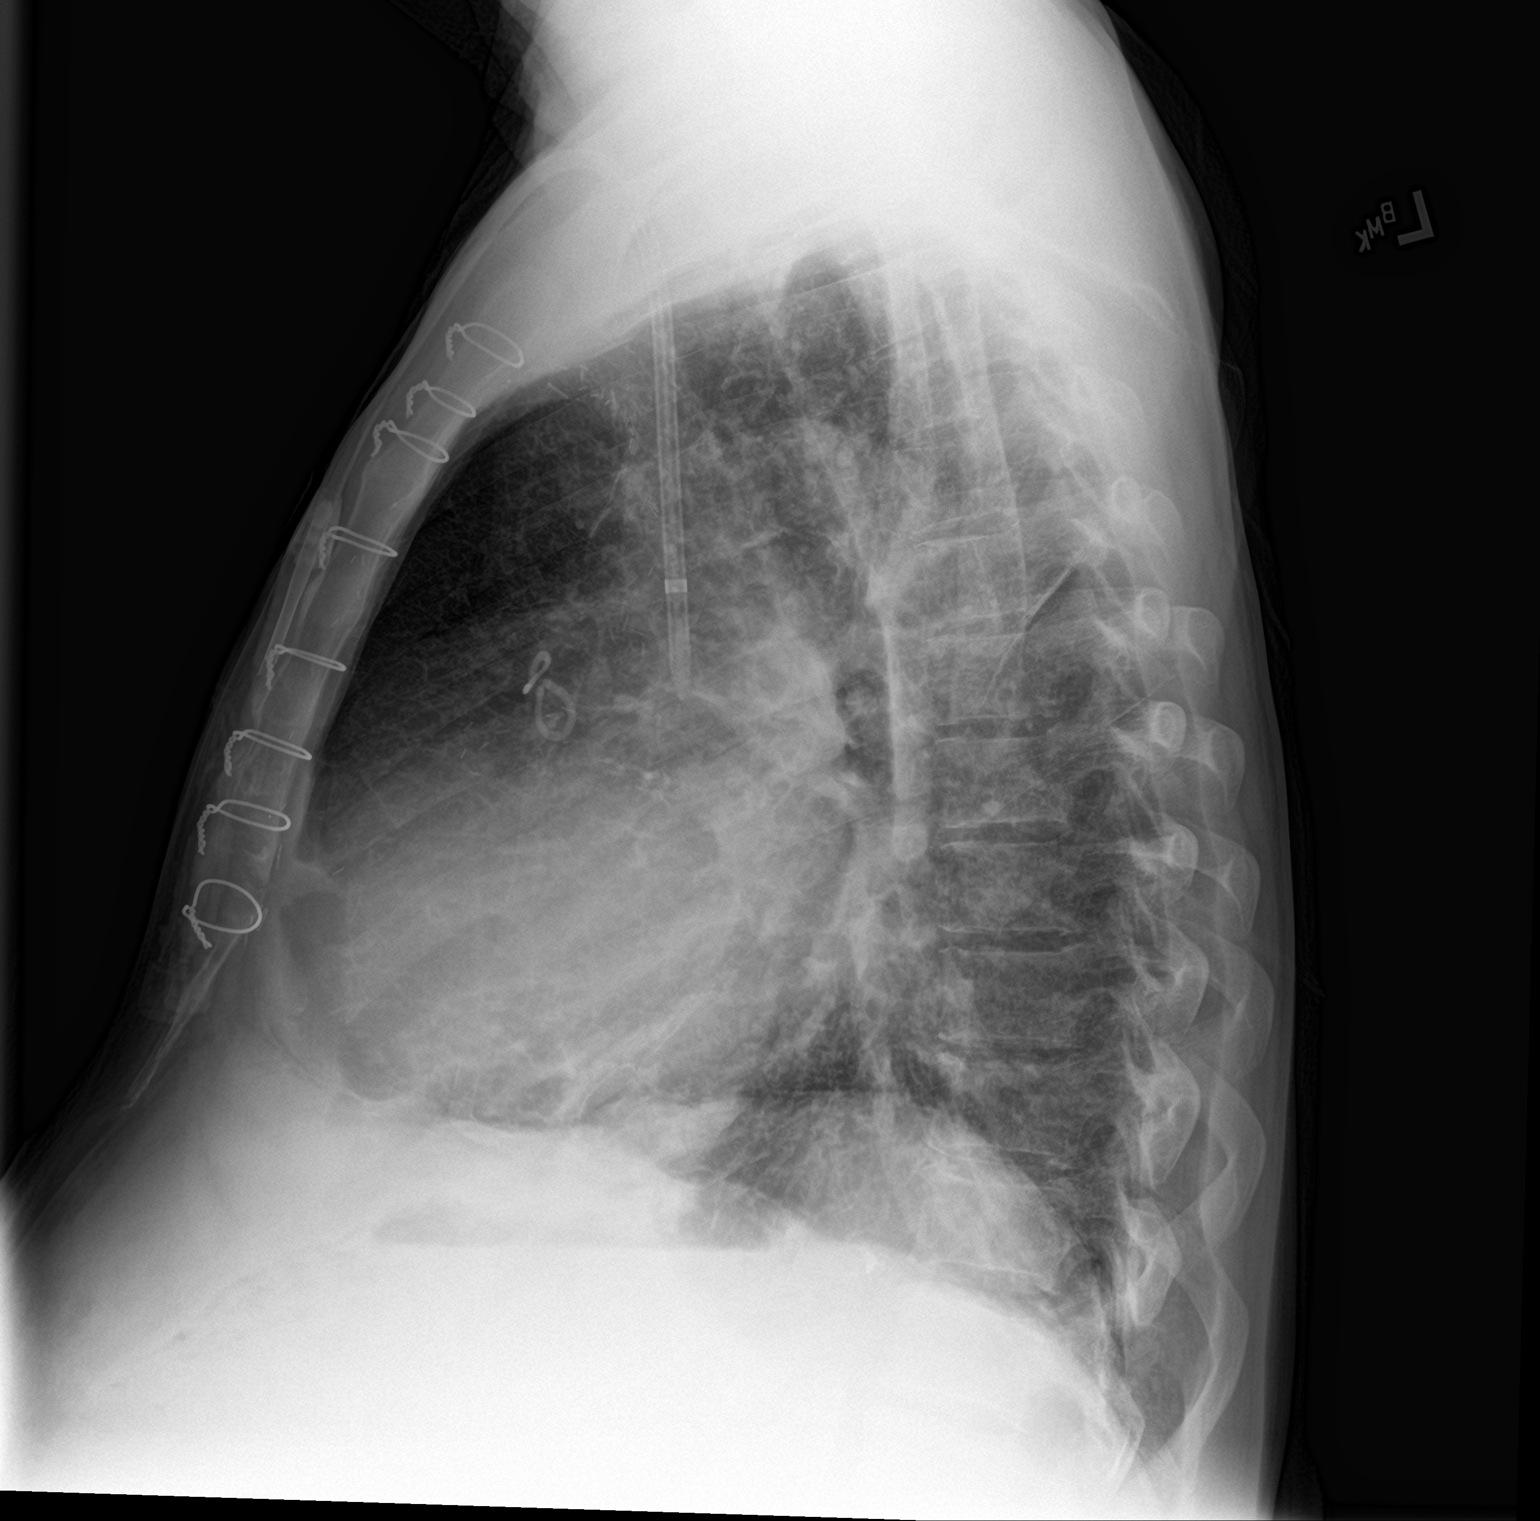

[2 of 2 positions shown; findings below may reference images not displayed]

FINDINGS: Post sternotomy changes. Right-sided central venous catheter tip
over the SVC. Cardiomegaly. Emphysematous disease. Streaky
atelectasis at the bases. Aortic atherosclerosis. No pneumothorax.
IMPRESSION: 1. Mild cardiomegaly without edema
2. Emphysematous disease.  Streaky atelectasis at the bases.

## 2020-01-15 IMAGING — NM NM GI BLOOD LOSS
2 series · 12 of 12 positions shown · non-contrast
Comparison: None.

CLINICAL DATA: Rectal bleeding.  Anemia.

EXAM:
NUCLEAR MEDICINE GASTROINTESTINAL BLEEDING SCAN
TECHNIQUE: Sequential abdominal images were obtained following intravenous
administration of Vc-KKm labeled red blood cells.
RADIOPHARMACEUTICALS:  21.5 mCi Vc-KKm pertechnetate in-vitro
labeled red cells.

[Series 1000: gi bleed hour 2 · 4.80mm/px · 6 of 60 frames shown]
[frame 6/60]
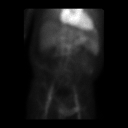
[frame 16/60]
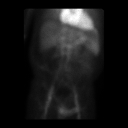
[frame 26/60]
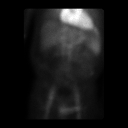
[frame 36/60]
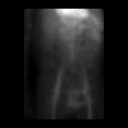
[frame 46/60]
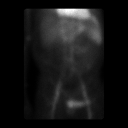
[frame 56/60]
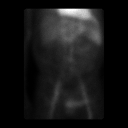

[Series 1000: hour 1 gi bleed · 4.80mm/px · 6 of 60 frames shown]
[frame 6/60]
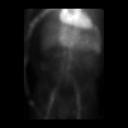
[frame 16/60]
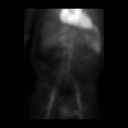
[frame 26/60]
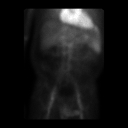
[frame 36/60]
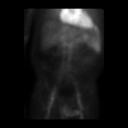
[frame 46/60]
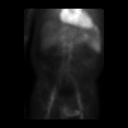
[frame 56/60]
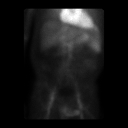

[12 of 12 positions shown; findings below may reference images not displayed]

FINDINGS: There is vague activity in the left abdomen best seen during the
first hour of imaging suggesting proximal small bowel activity.
Activity over the stomach could be from free pertechnetate or
bleeding. No definite colonic activity. Expected appearance of the
liver, spleen, and blood pool. Genital activity along the pelvis.
IMPRESSION: 1. Left upper quadrant activity in small bowel, likely proximal
small bowel, compatible with active bleeding.

## 2020-01-24 IMAGING — US US PARACENTESIS
1 series · 6 of 6 positions shown · non-contrast
Comparison: none

CLINICAL DATA: Alcoholic cirrhosis with recurrent symptomatic large
volume ascites

EXAM:
ULTRASOUND GUIDED PARACENTESIS
TECHNIQUE: The procedure, risks (including but not limited to bleeding,
infection, organ damage ), benefits, and alternatives were explained
to the patient. Questions regarding the procedure were encouraged
and answered. The patient understands and consents to the procedure.
Survey ultrasound of the abdomen was performed and an appropriate
skin entry site in the right lower abdomen was selected. Skin site
was marked, prepped with chlorhexadine, draped in usual sterile
fashion, and infiltrated locally with 1% lidocaine. A
Safe-T-Centesis needle was advanced into the peritoneal space until
fluid could be aspirated. The sheath was advanced and the needle
removed. 5 L of straw-colored ascites were aspirated.
The patient tolerated the procedure well.
COMPLICATIONS:
COMPLICATIONS
none

[Series 1: us paracentesis · 6 of 6 slices shown]
[im 1/6]
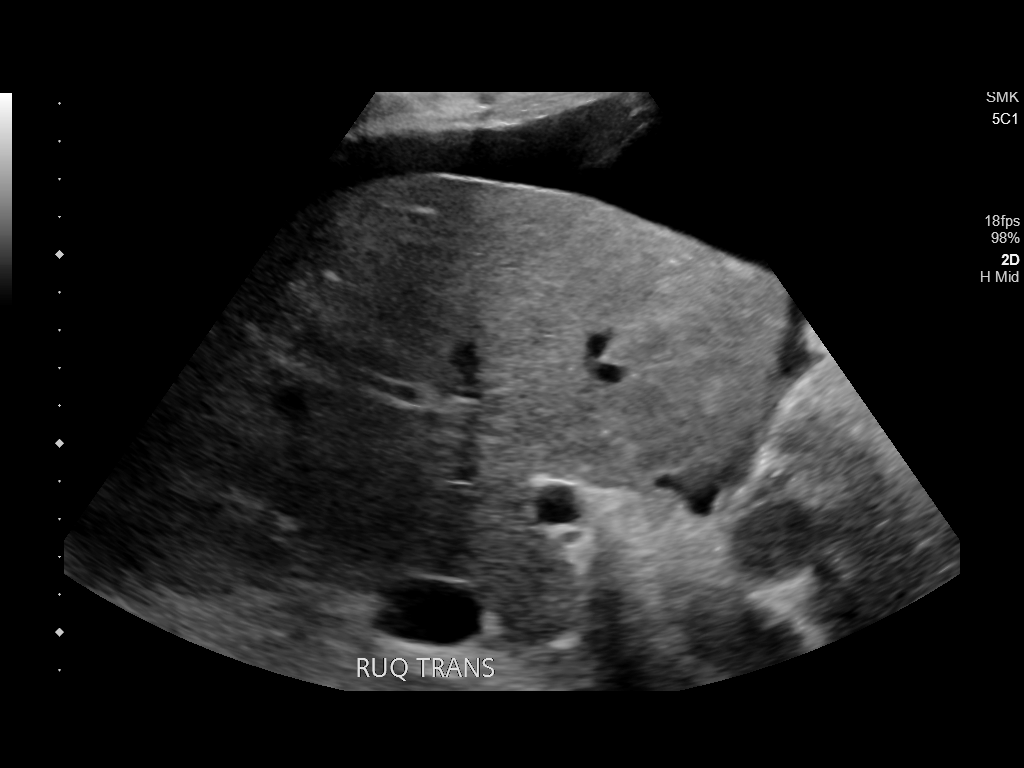
[im 2/6]
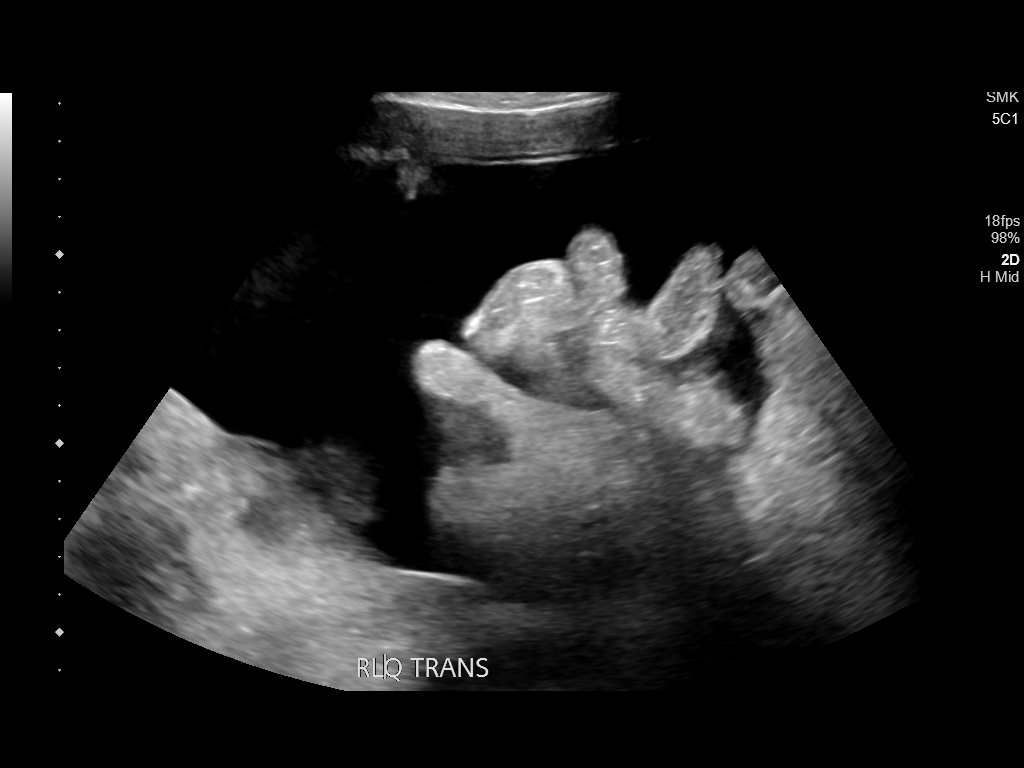
[im 3/6]
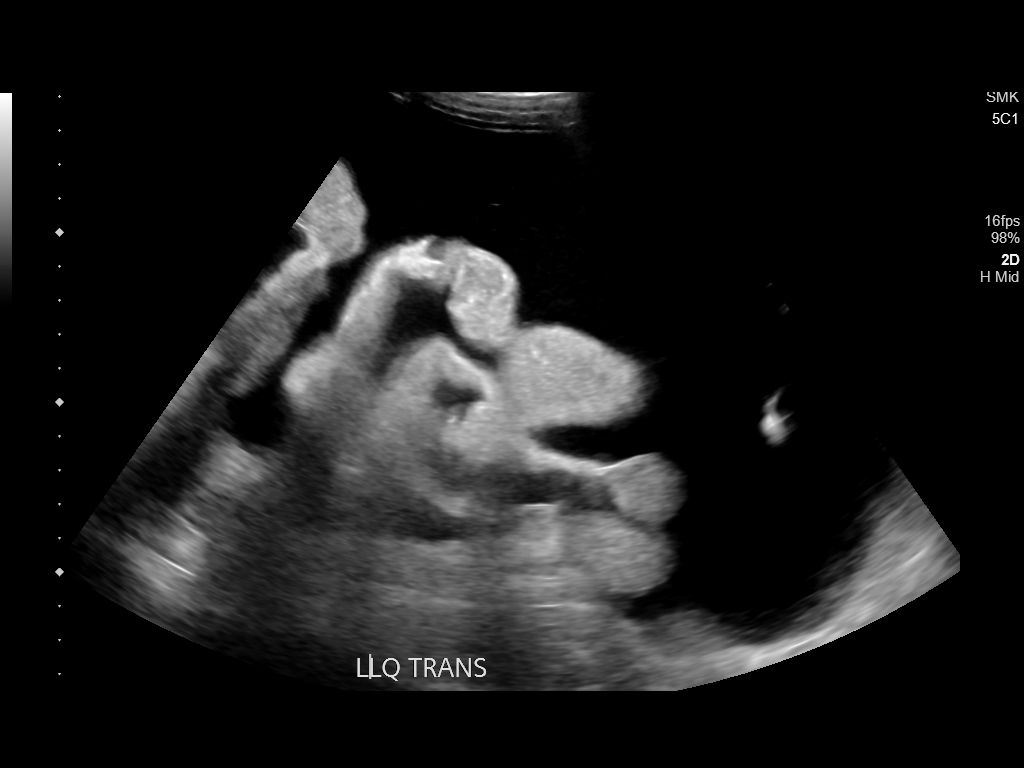
[im 4/6]
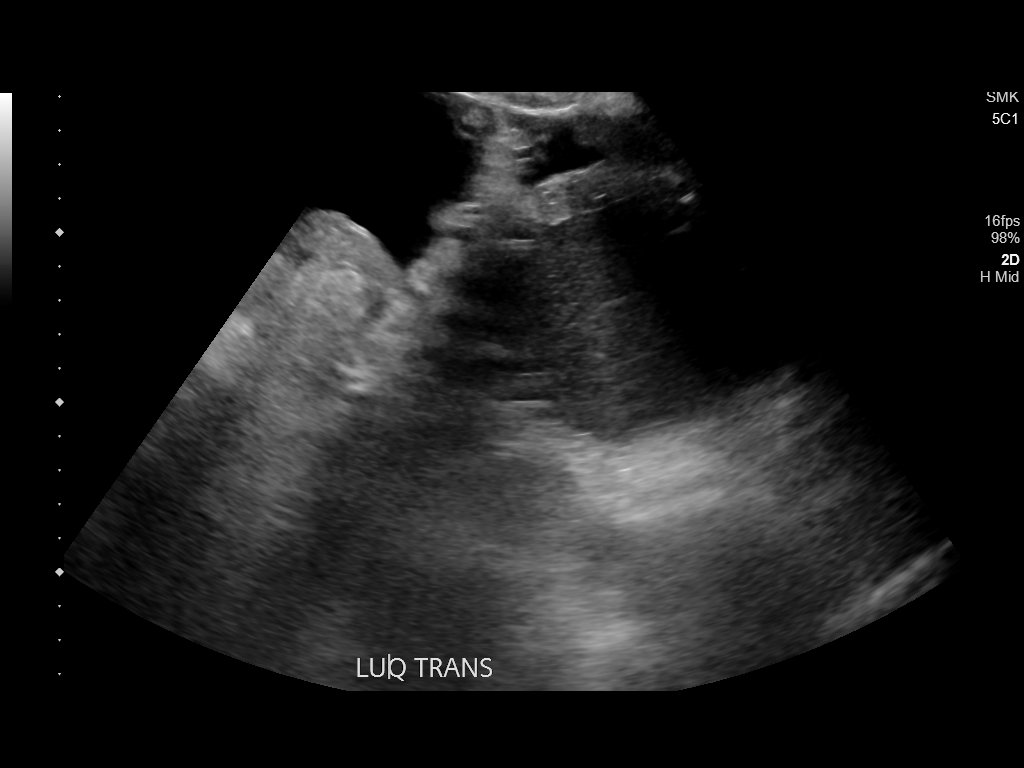
[im 5/6]
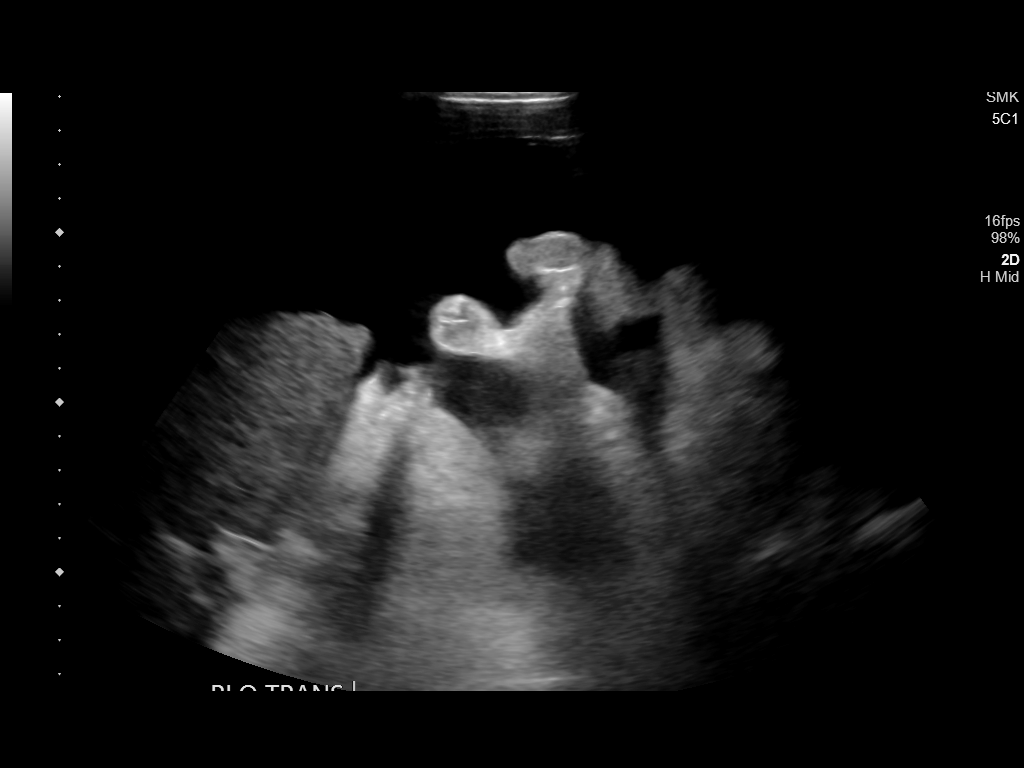
[im 6/6]
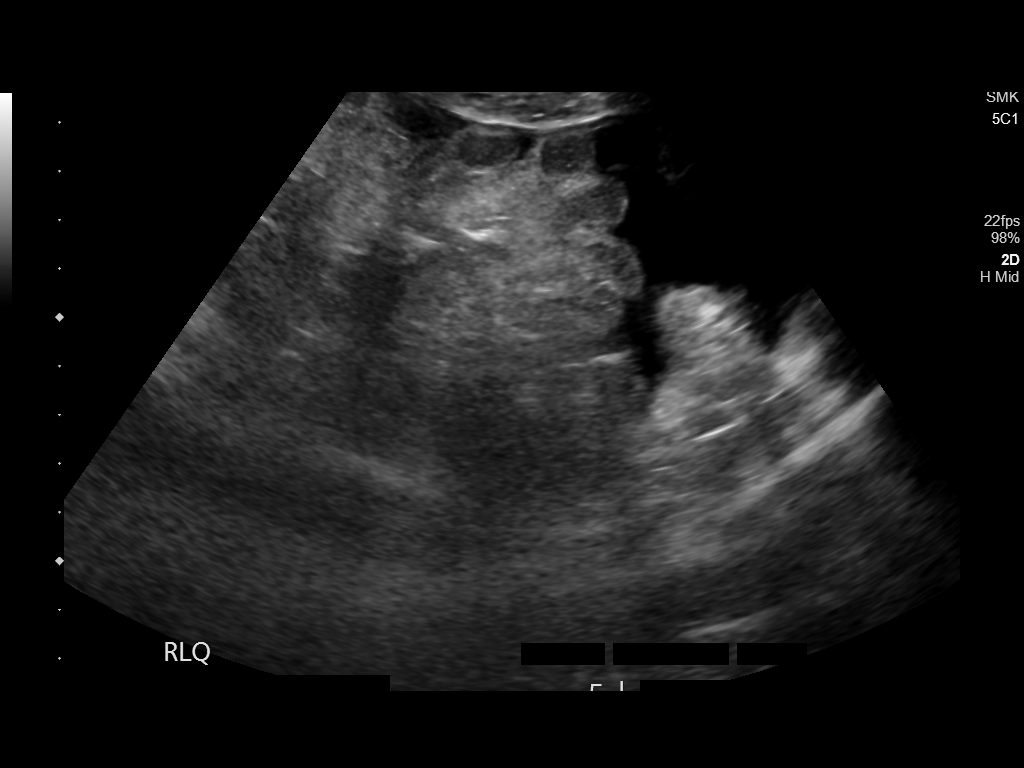

[6 of 6 positions shown; findings below may reference images not displayed]

IMPRESSION: Technically successful ultrasound guided paracentesis, removing 5 L
ascites.

## 2020-01-28 IMAGING — CR DG CHEST 2V
1 series · 2 of 2 positions shown · non-contrast
Comparison: 02/19/2018

CLINICAL DATA: Cough and shortness of breath over the last week.
Recent pneumonia.

EXAM:
CHEST - 2 VIEW

[Series 1: dg chest 2 view · 0.14mm/px · 2 of 2 slices shown]
[im 1/2]
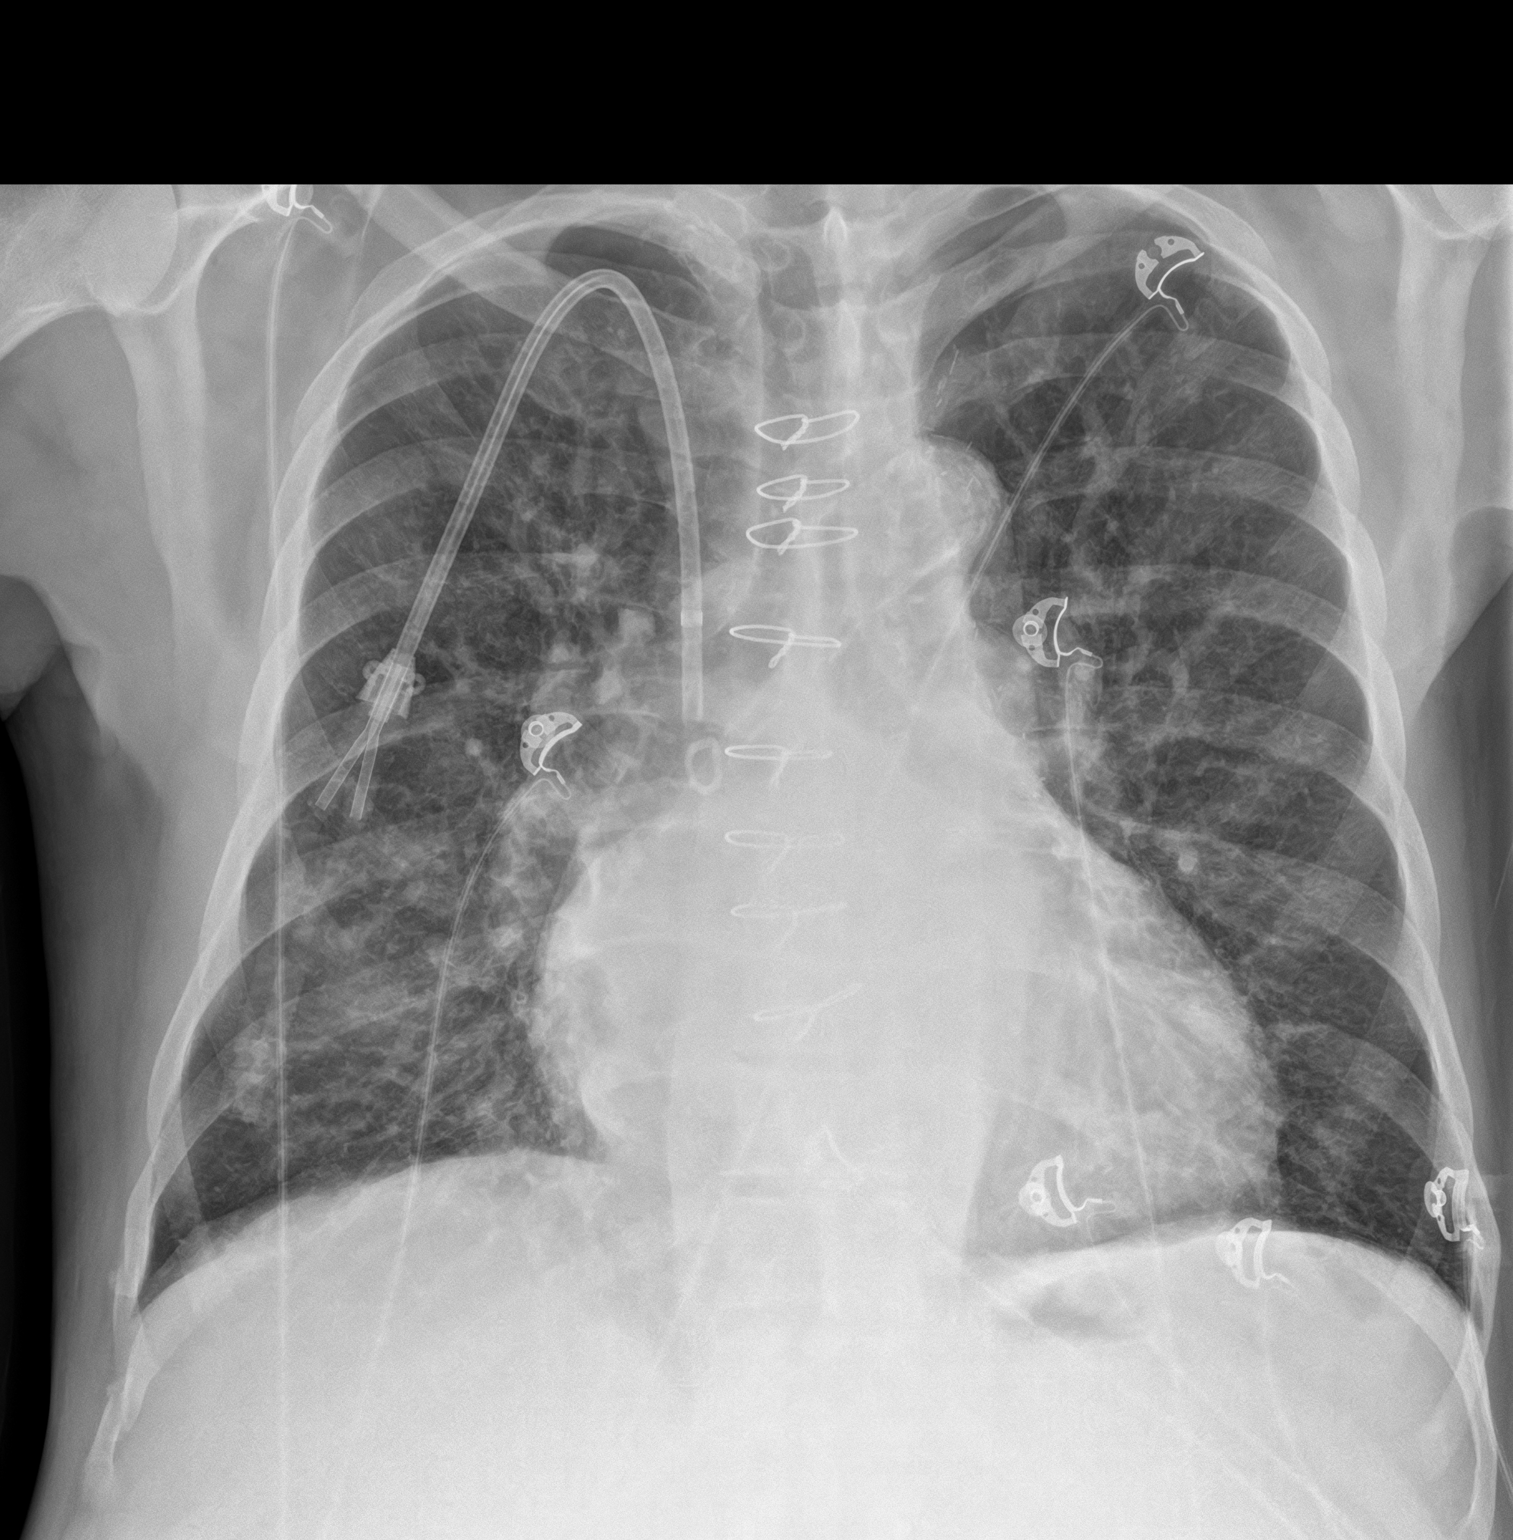
[im 2/2]
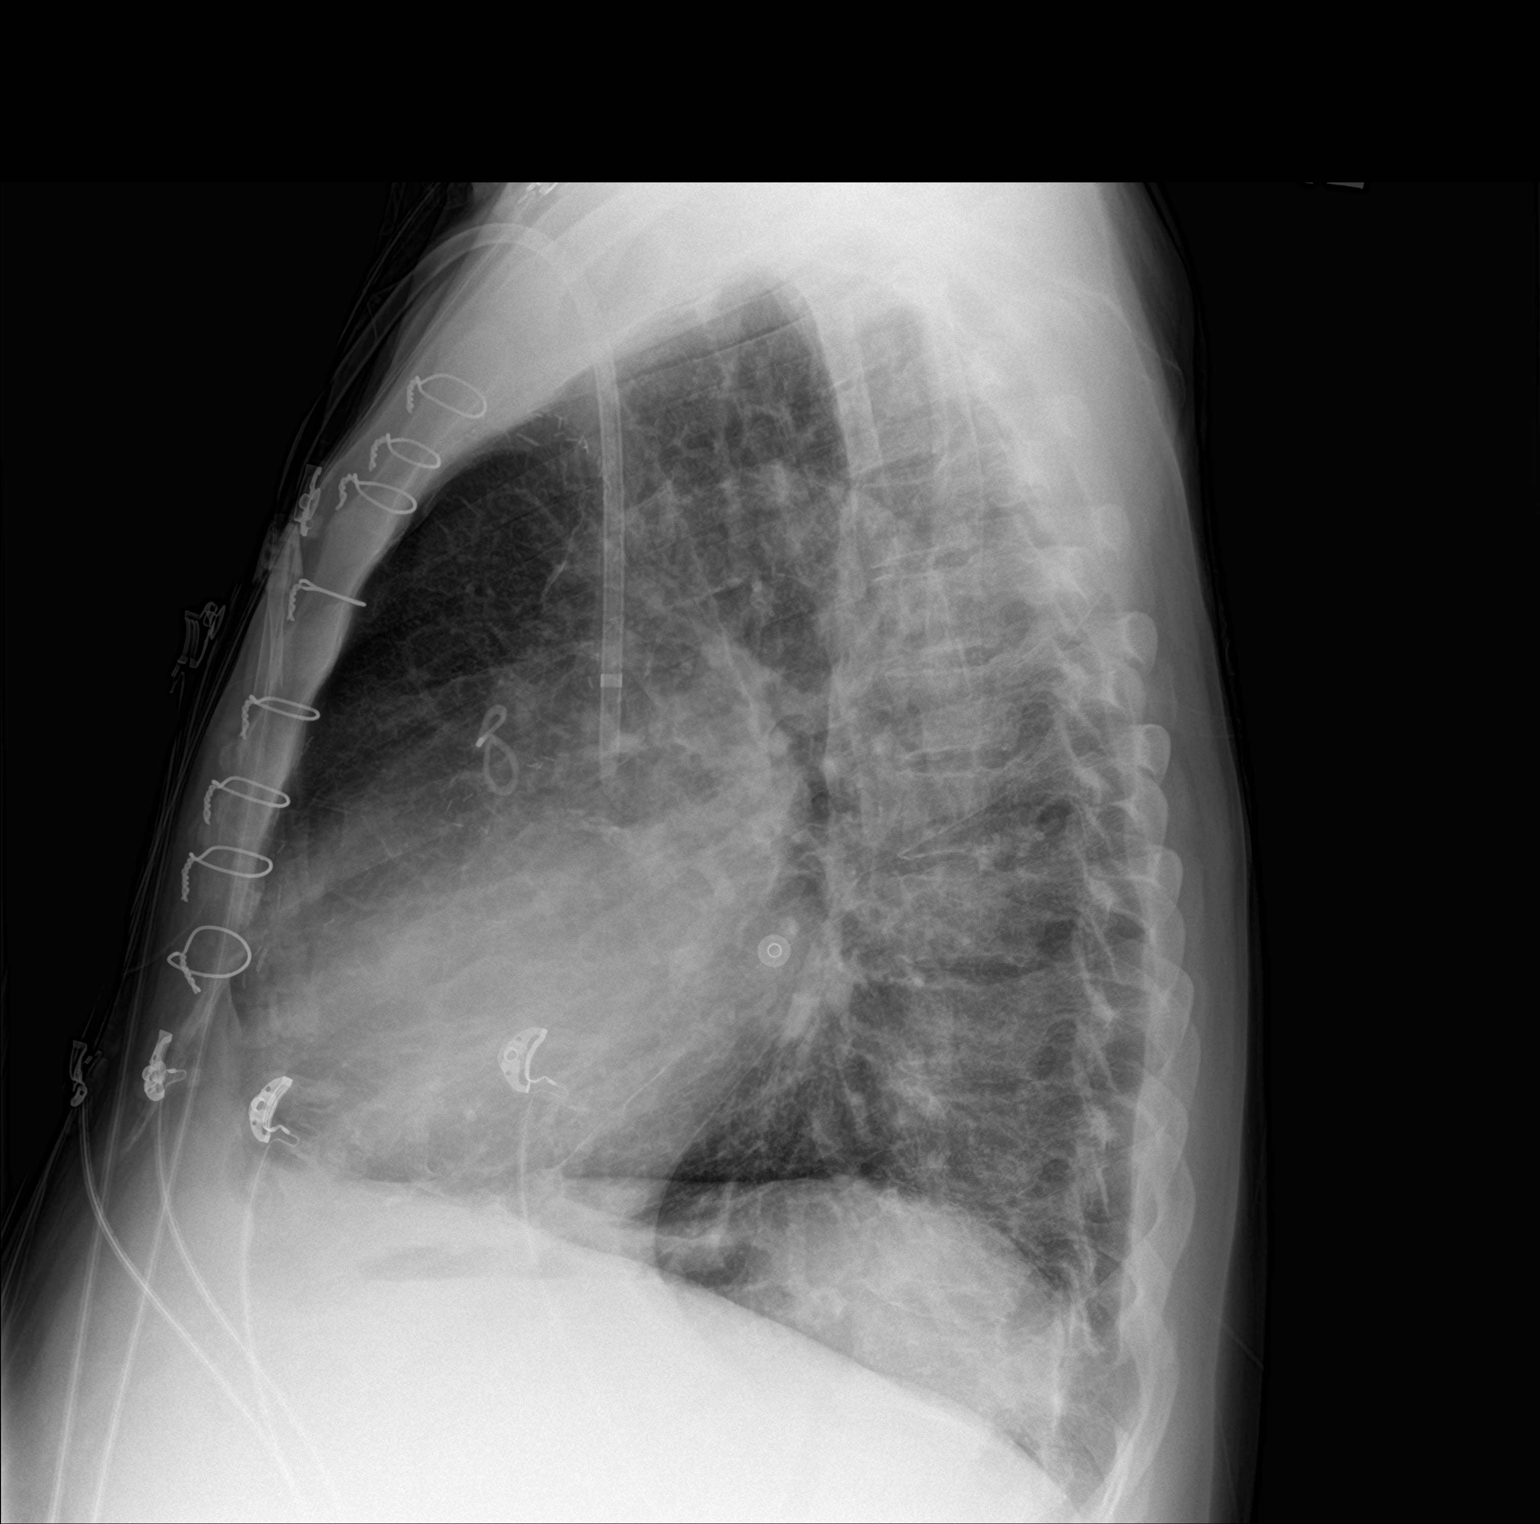

[2 of 2 positions shown; findings below may reference images not displayed]

FINDINGS: Previous median sternotomy. Chronic cardiomegaly and aortic
atherosclerosis. Right internal jugular central line tip in the SVC
4 cm above the right atrium. Pulmonary venous hypertension without
frank edema. No infiltrate, collapse or effusion.
IMPRESSION: Cardiomegaly and aortic atherosclerosis. Pulmonary venous
hypertension without frank edema.

## 2020-02-01 IMAGING — US US PARACENTESIS
1 series · 5 of 5 positions shown · non-contrast
Comparison: none

INDICATION: Current ascites, alcoholic hepatitis

[Series 1: us paracentesis · 5 of 5 slices shown]
[im 1/5]
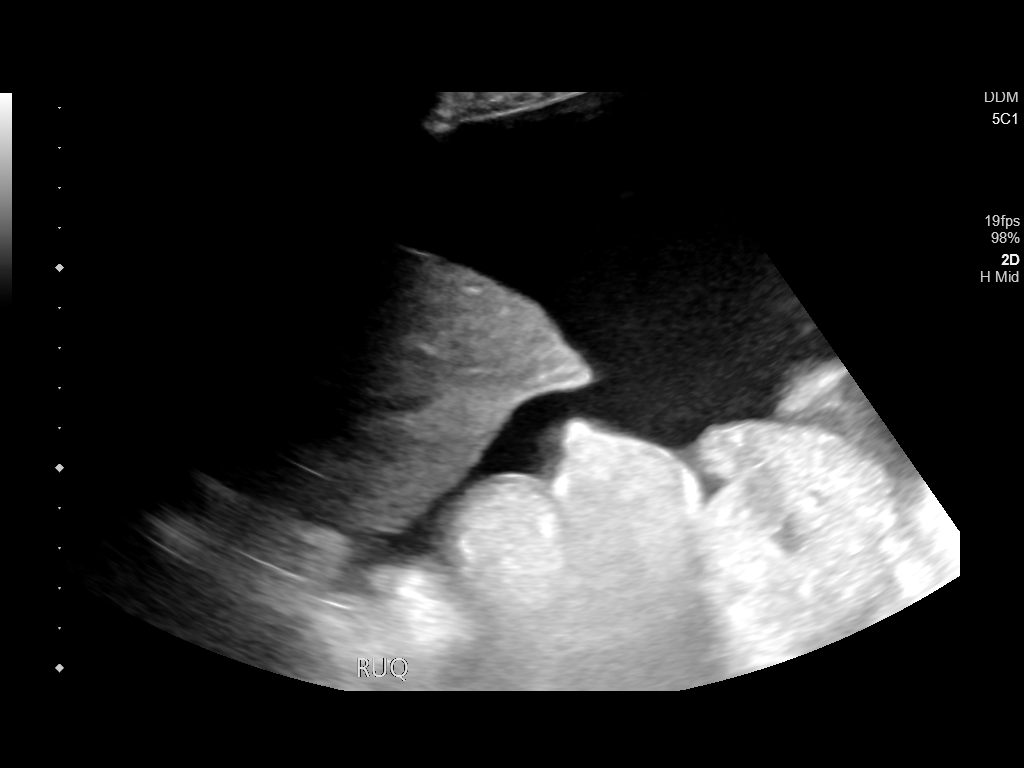
[im 2/5]
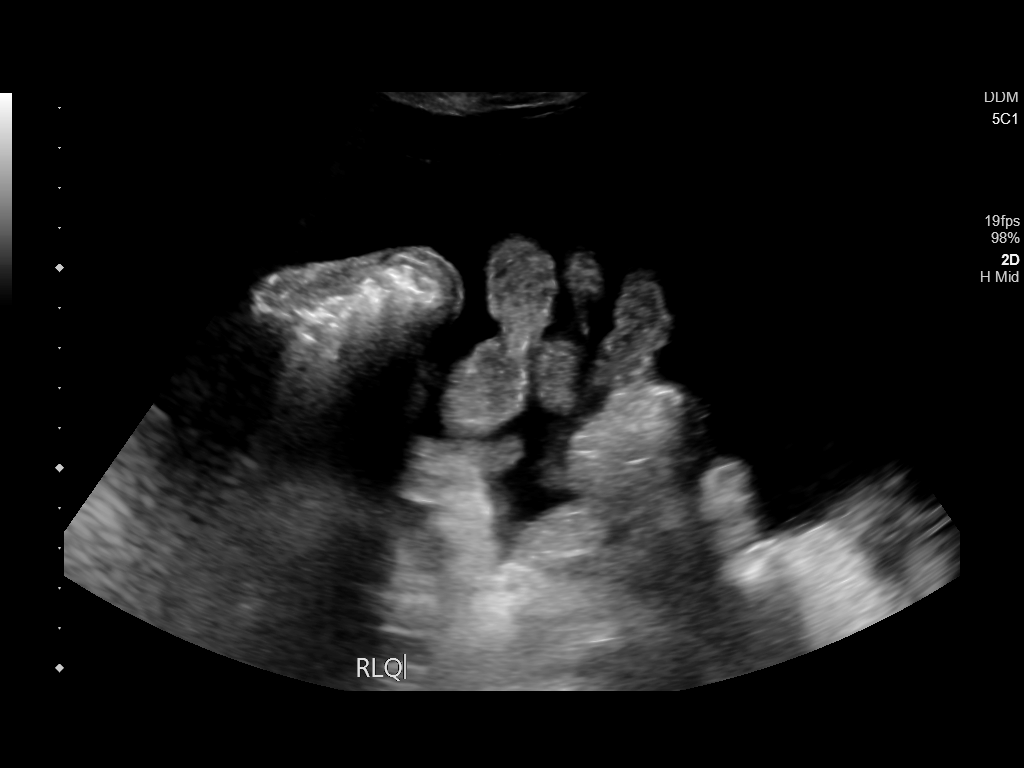
[im 3/5]
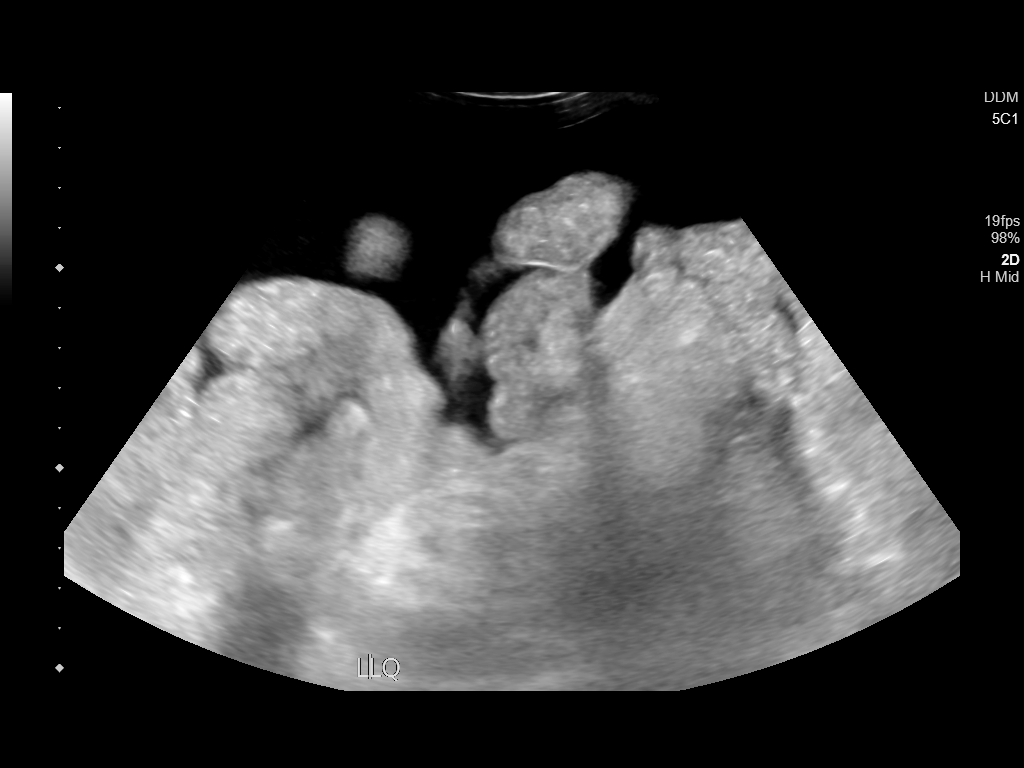
[im 4/5]
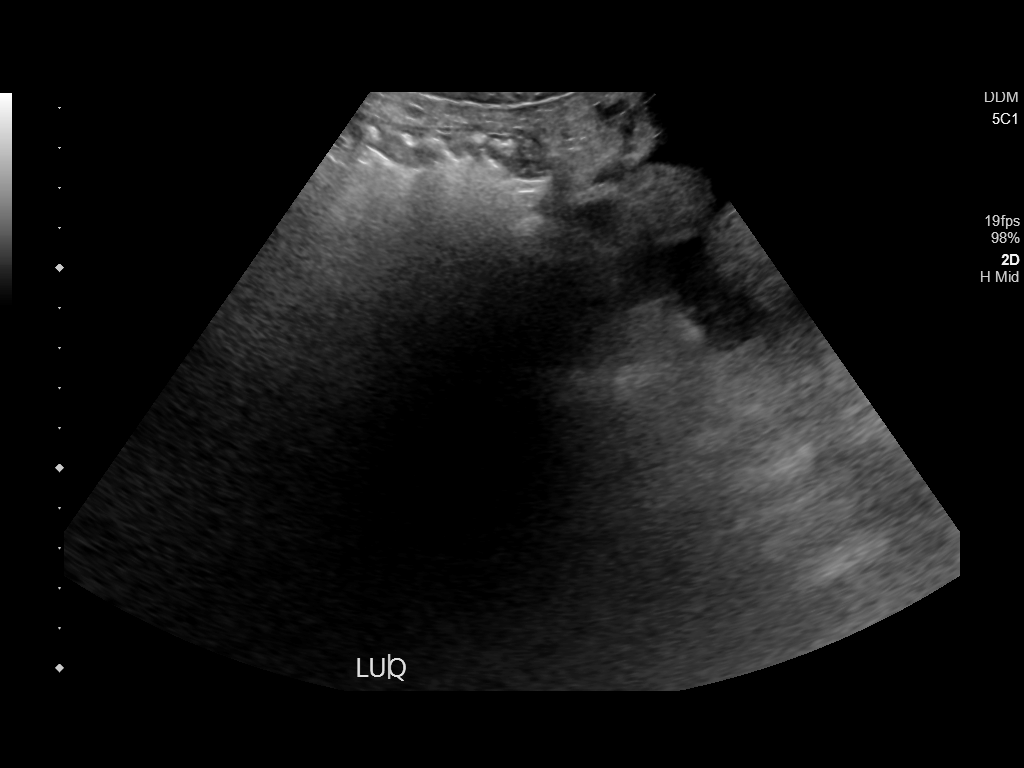
[im 5/5]
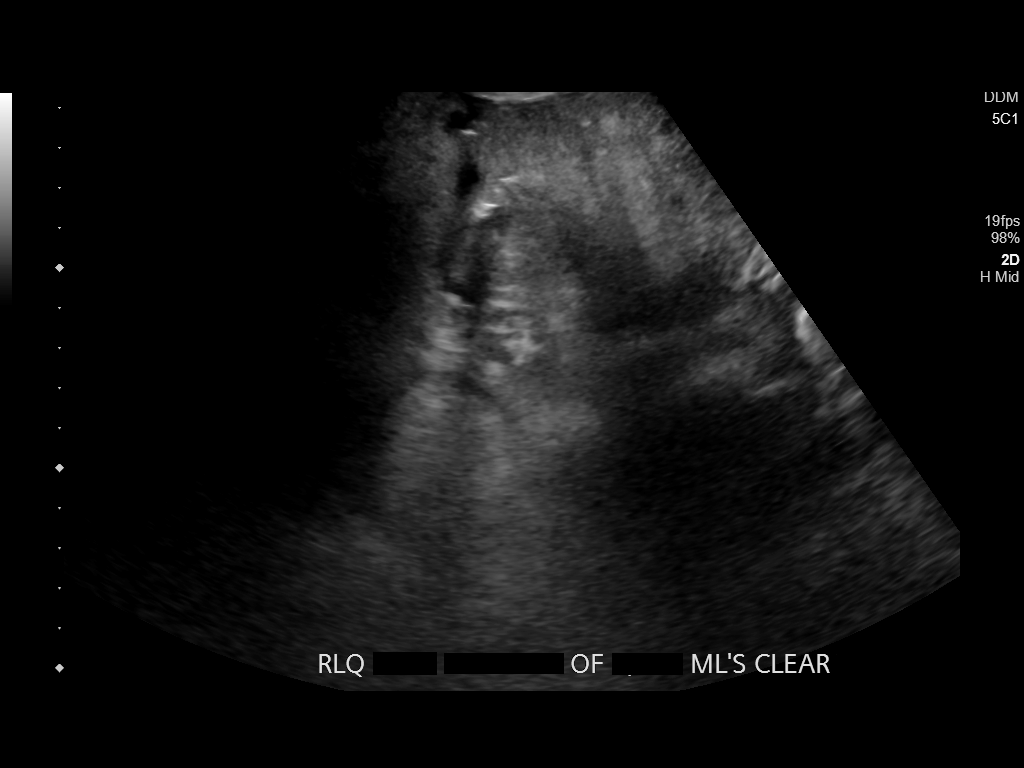

[5 of 5 positions shown; findings below may reference images not displayed]

EXAM:
ULTRASOUND GUIDED RIGHT PARACENTESIS

MEDICATIONS:
1% LIDOCAINE LOCAL

COMPLICATIONS:
None immediate.

PROCEDURE:
Informed written consent was obtained from the patient after a
discussion of the risks, benefits and alternatives to treatment. A
timeout was performed prior to the initiation of the procedure.

Initial ultrasound scanning demonstrates a large amount of ascites
within the right lower abdominal quadrant. The right lower abdomen
was prepped and draped in the usual sterile fashion. 1% lidocaine
with epinephrine was used for local anesthesia.

Following this, a 6 Fr Safe-T-Centesis catheter was introduced. An
ultrasound image was saved for documentation purposes. The
paracentesis was performed. The catheter was removed and a dressing
was applied. The patient tolerated the procedure well without
immediate post procedural complication.
FINDINGS: A total of approximately 6.75 L of clear peritoneal fluid was
removed. Samples were sent to the laboratory as requested by the
clinical team.
IMPRESSION: Successful ultrasound-guided paracentesis yielding 6.75 L liters of
peritoneal fluid.

## 2020-02-01 IMAGING — US US PARACENTESIS
1 series · 2 of 2 positions shown · non-contrast
Comparison: none

INDICATION: End-stage renal disease and recurrent ascites.

[Series 1: us paracentesis · 2 of 2 slices shown]
[im 1/2]
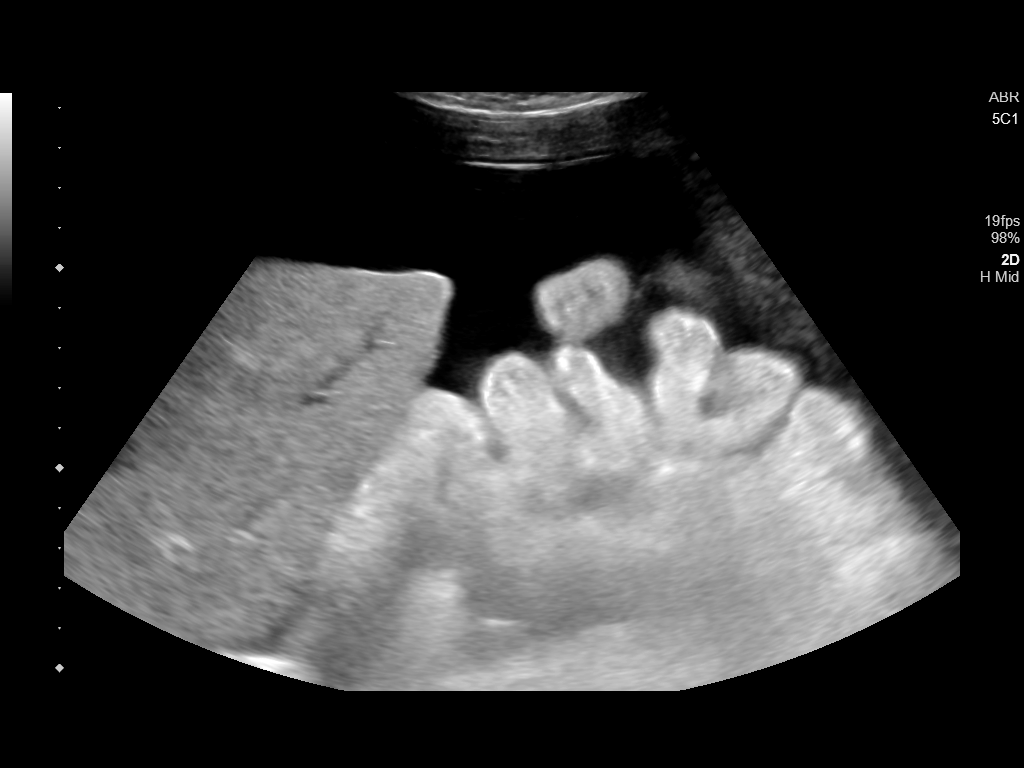
[im 2/2]
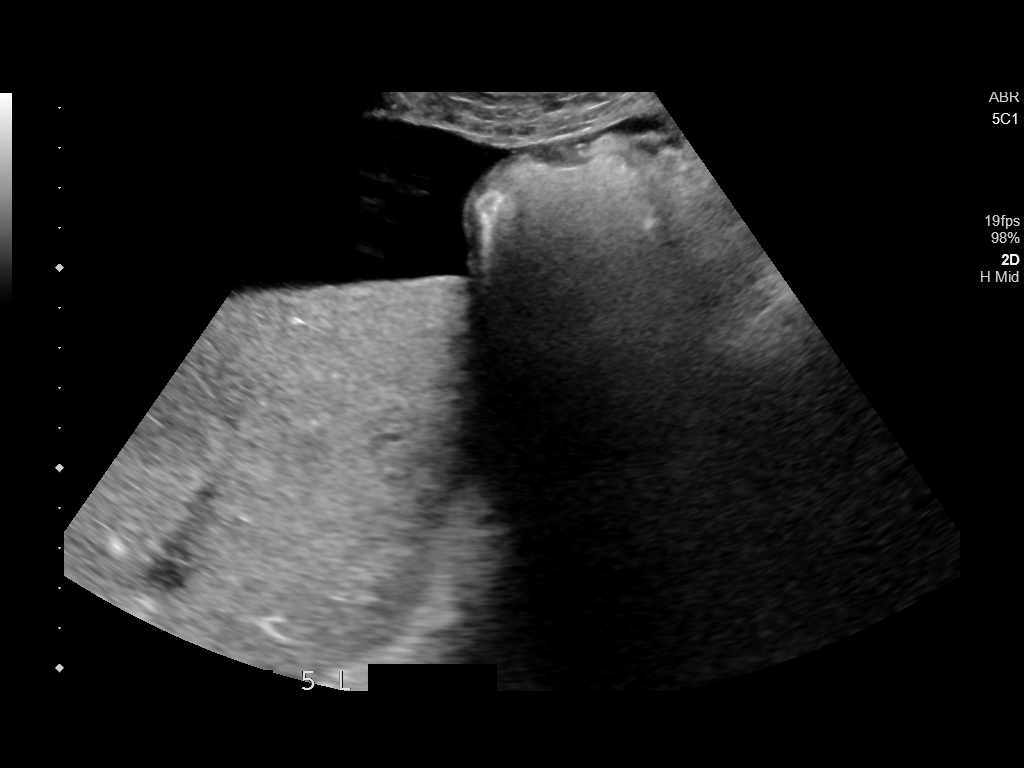

[2 of 2 positions shown; findings below may reference images not displayed]

EXAM:
ULTRASOUND GUIDED PARACENTESIS

MEDICATIONS:
None.

COMPLICATIONS:
None immediate.

PROCEDURE:
Informed written consent was obtained from the patient after a
discussion of the risks, benefits and alternatives to treatment. A
timeout was performed prior to the initiation of the procedure.

Initial ultrasound was performed to localize ascites. The right
lower abdomen was prepped and draped in the usual sterile fashion.
1% lidocaine was used for local anesthesia.

Following this, a 6 Fr Safe-T-Centesis catheter was introduced. An
ultrasound image was saved for documentation purposes. The
paracentesis was performed. The catheter was removed and a dressing
was applied. The patient tolerated the procedure well without
immediate post procedural complication.
FINDINGS: A total of approximately 5 L of clear, yellow fluid was removed.
IMPRESSION: Successful ultrasound-guided paracentesis yielding 5 liters of
peritoneal fluid.

## 2020-02-01 IMAGING — CR DG CHEST 2V
2 series · 2 of 2 positions shown · non-contrast
Comparison: Chest radiograph and CT 06/03/2017

CLINICAL DATA: Shortness of breath for 1 day. Chronic cough.
History of CHF.

EXAM:
CHEST - 2 VIEW

[chest pa]
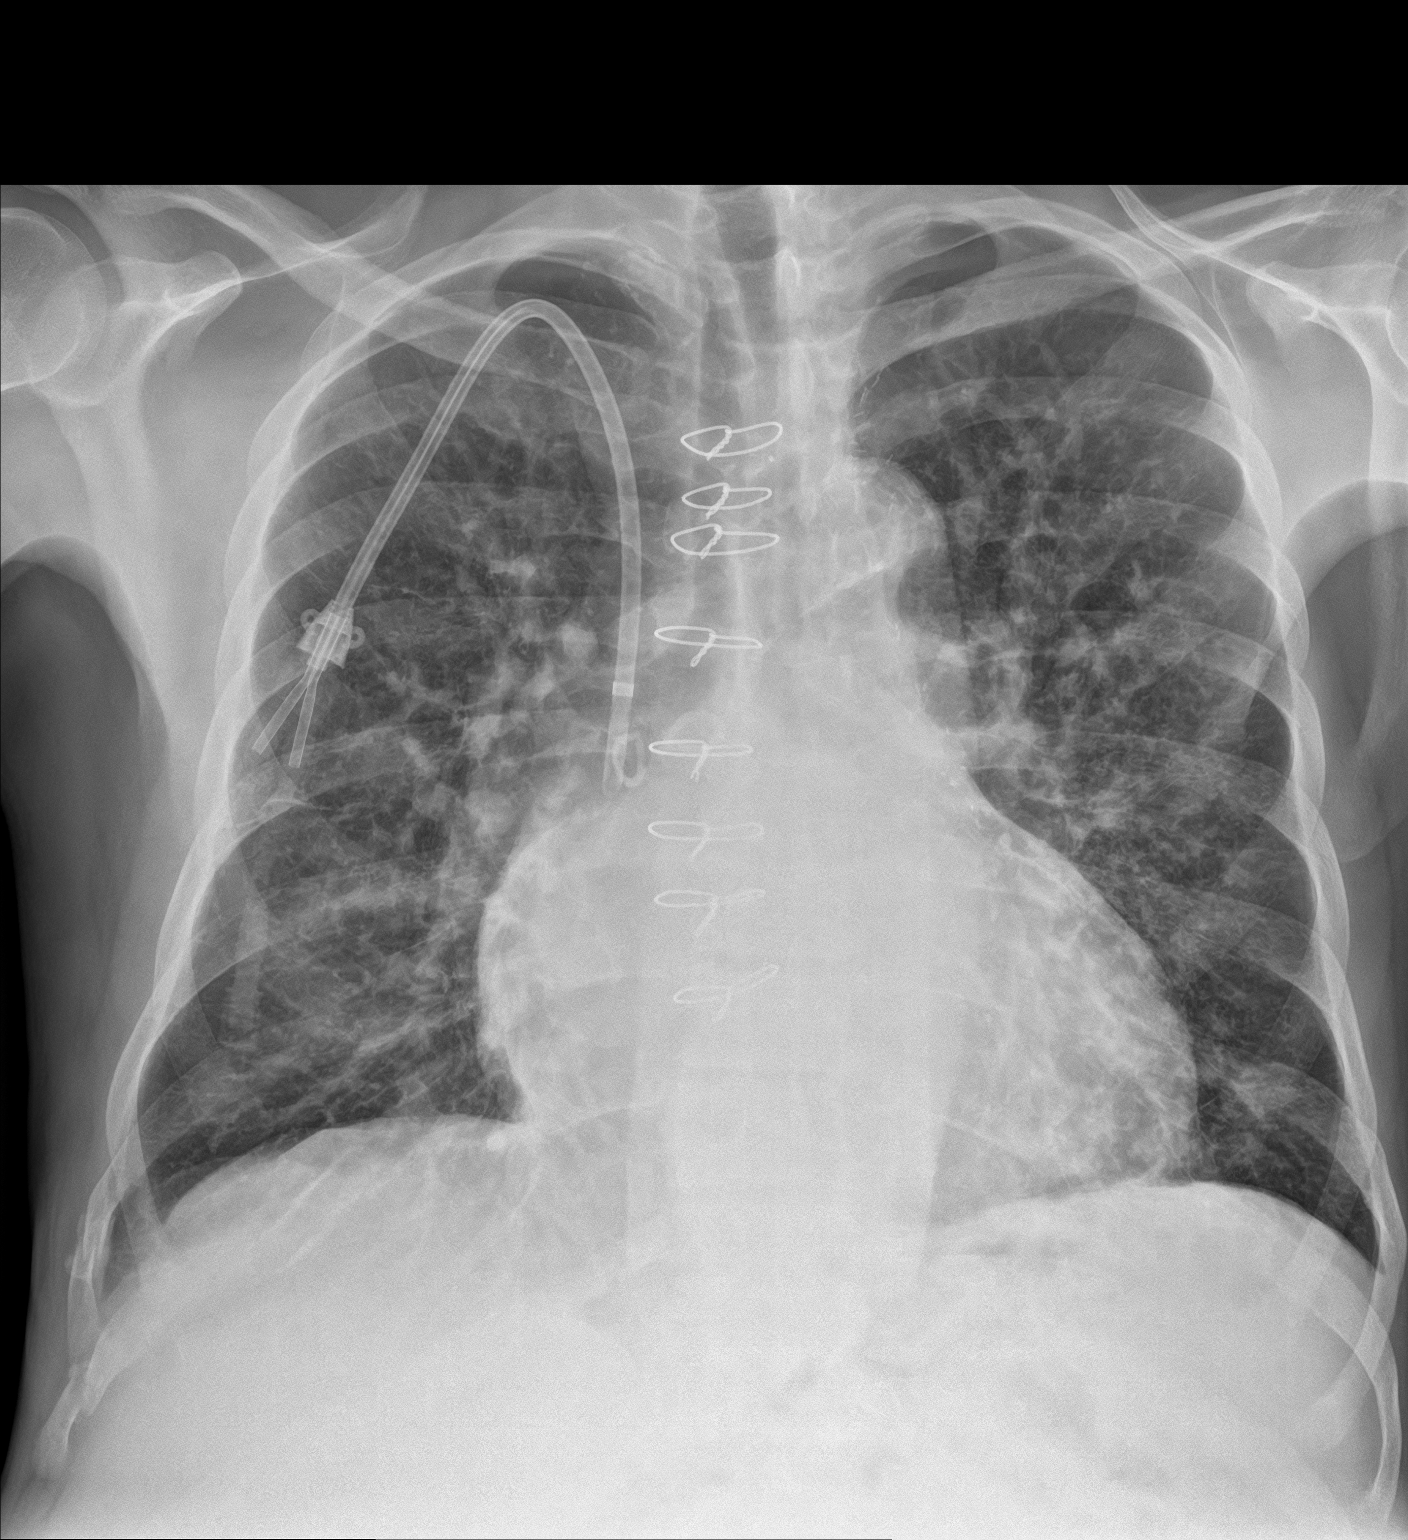

[chest lat]
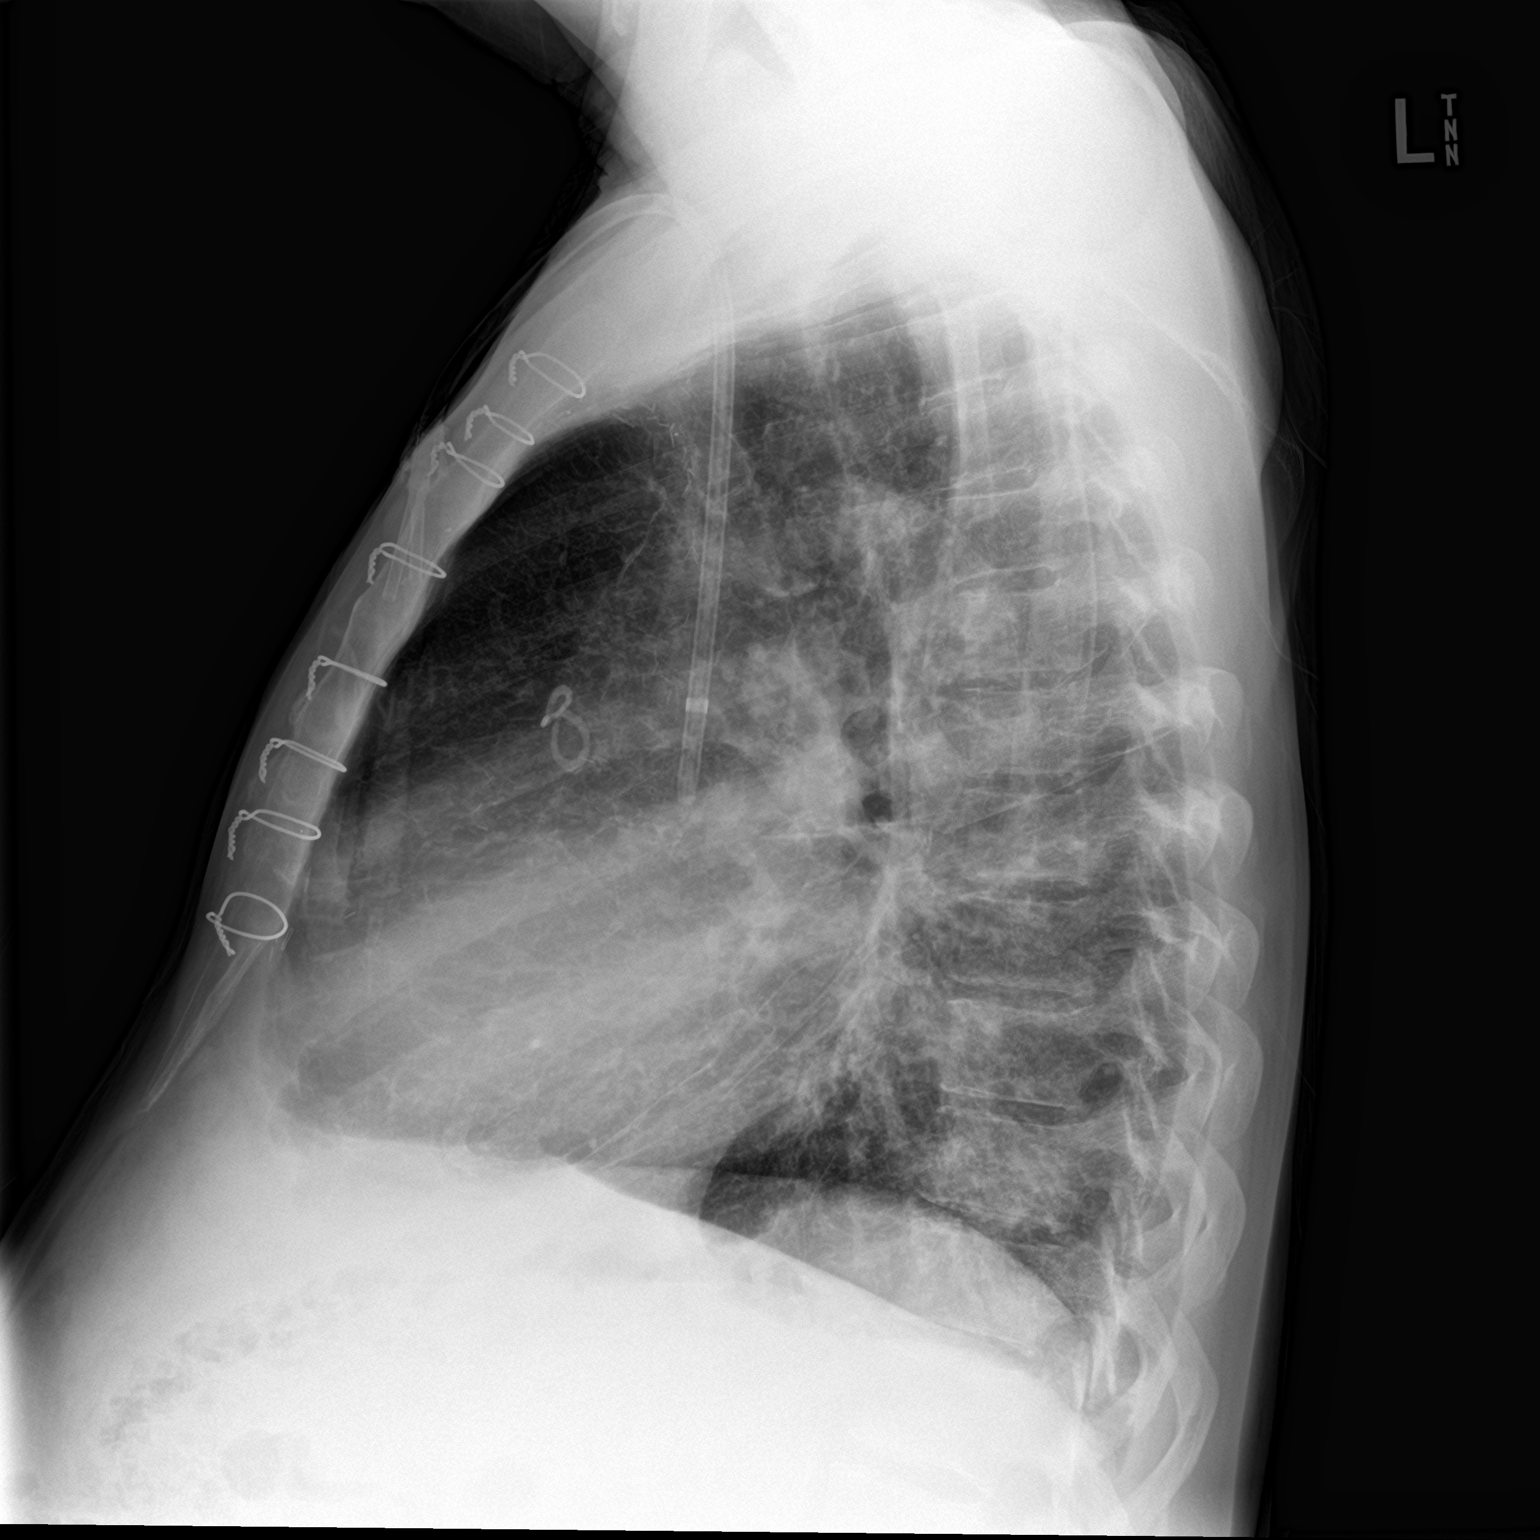

[2 of 2 positions shown; findings below may reference images not displayed]

FINDINGS: Sequelae of prior CABG are again identified. A right jugular
dialysis catheter has been placed and terminates over the lower SVC.
The cardiac silhouette remains enlarged. Aortic atherosclerosis is
noted. Compared to the prior radiograph, there is increased
pulmonary vascular congestion with mildly increased interstitial
markings diffusely as well as patchy opacities in the perihilar
regions. No pleural effusion or pneumothorax is identified. Old
right lower rib fractures are noted.
IMPRESSION: Cardiomegaly with pulmonary vascular congestion and bilateral lung
opacities likely reflecting mild edema.

## 2020-02-06 IMAGING — DX DG CHEST 1V PORT
2 series · 2 of 2 positions shown · non-contrast
Comparison: Radiographs March 04, 2018.

CLINICAL DATA: Cough, shortness of breath.

EXAM:
PORTABLE CHEST 1 VIEW

[chest ap (1 of 2)]
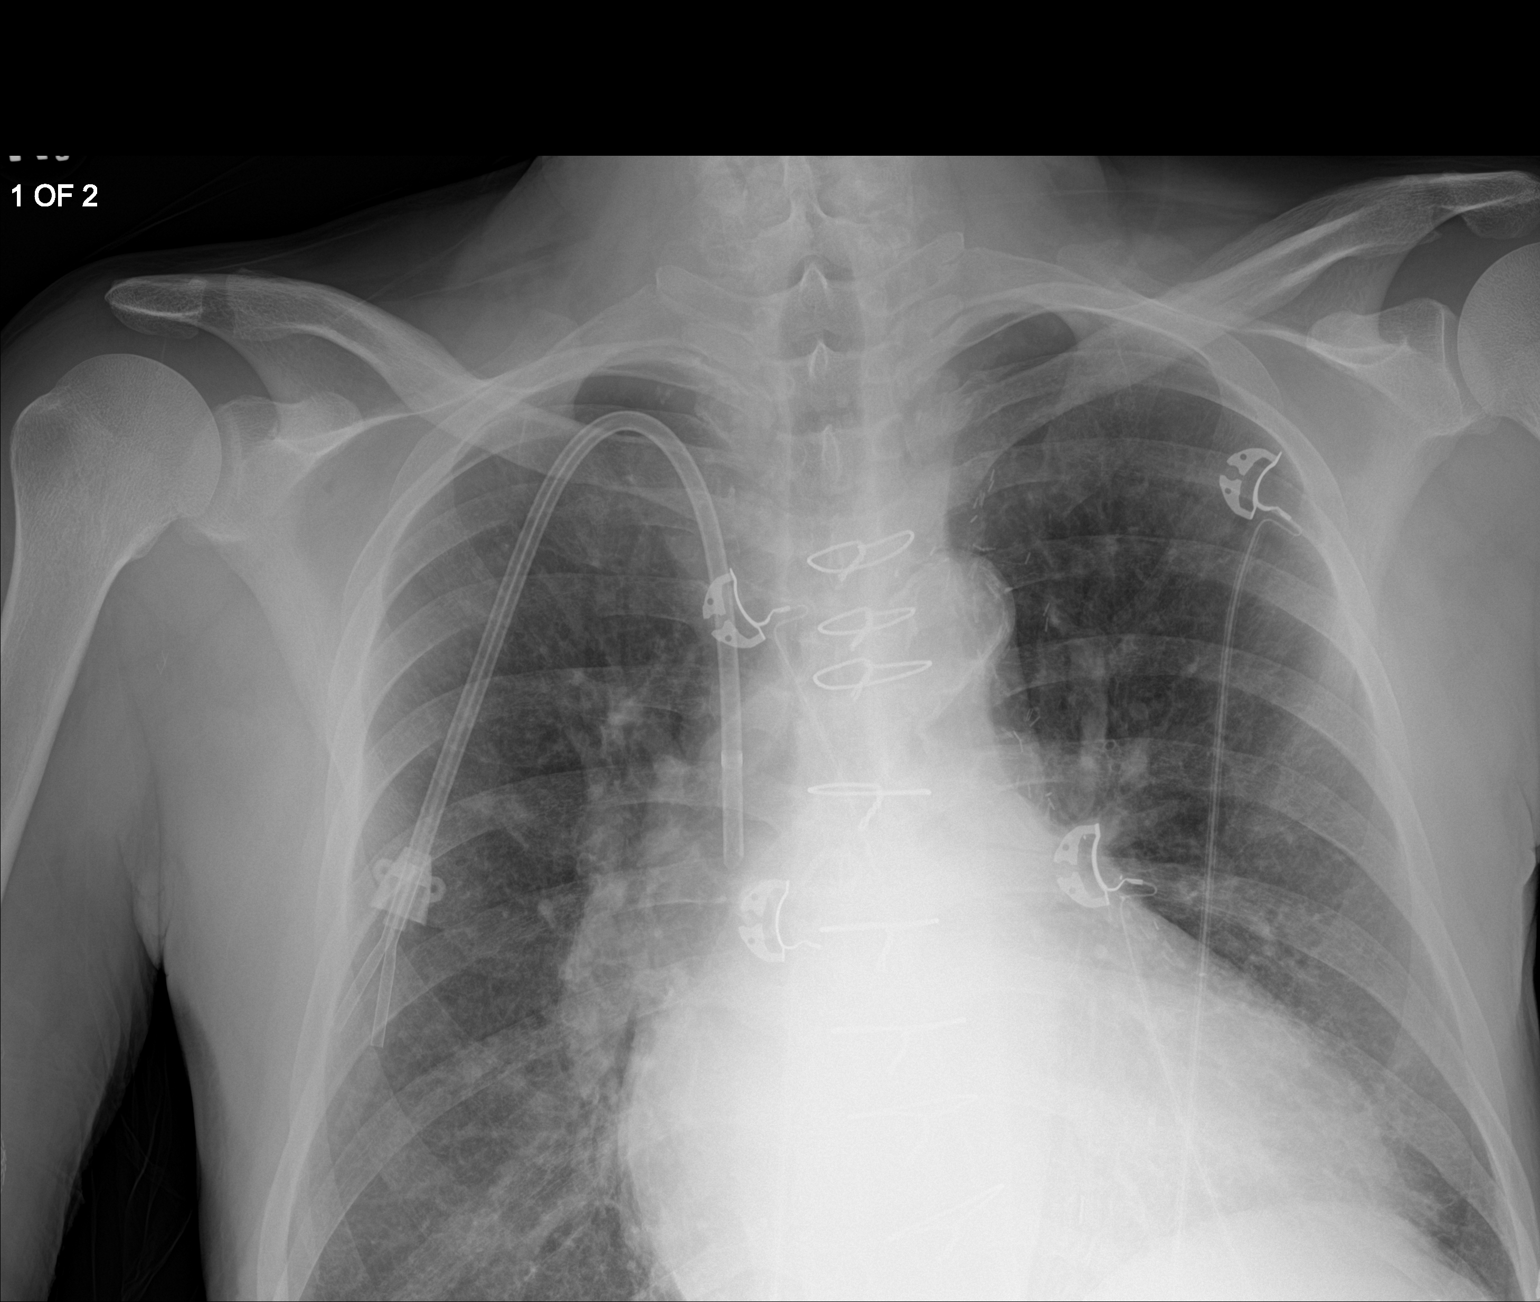

[chest ap (2 of 2)]
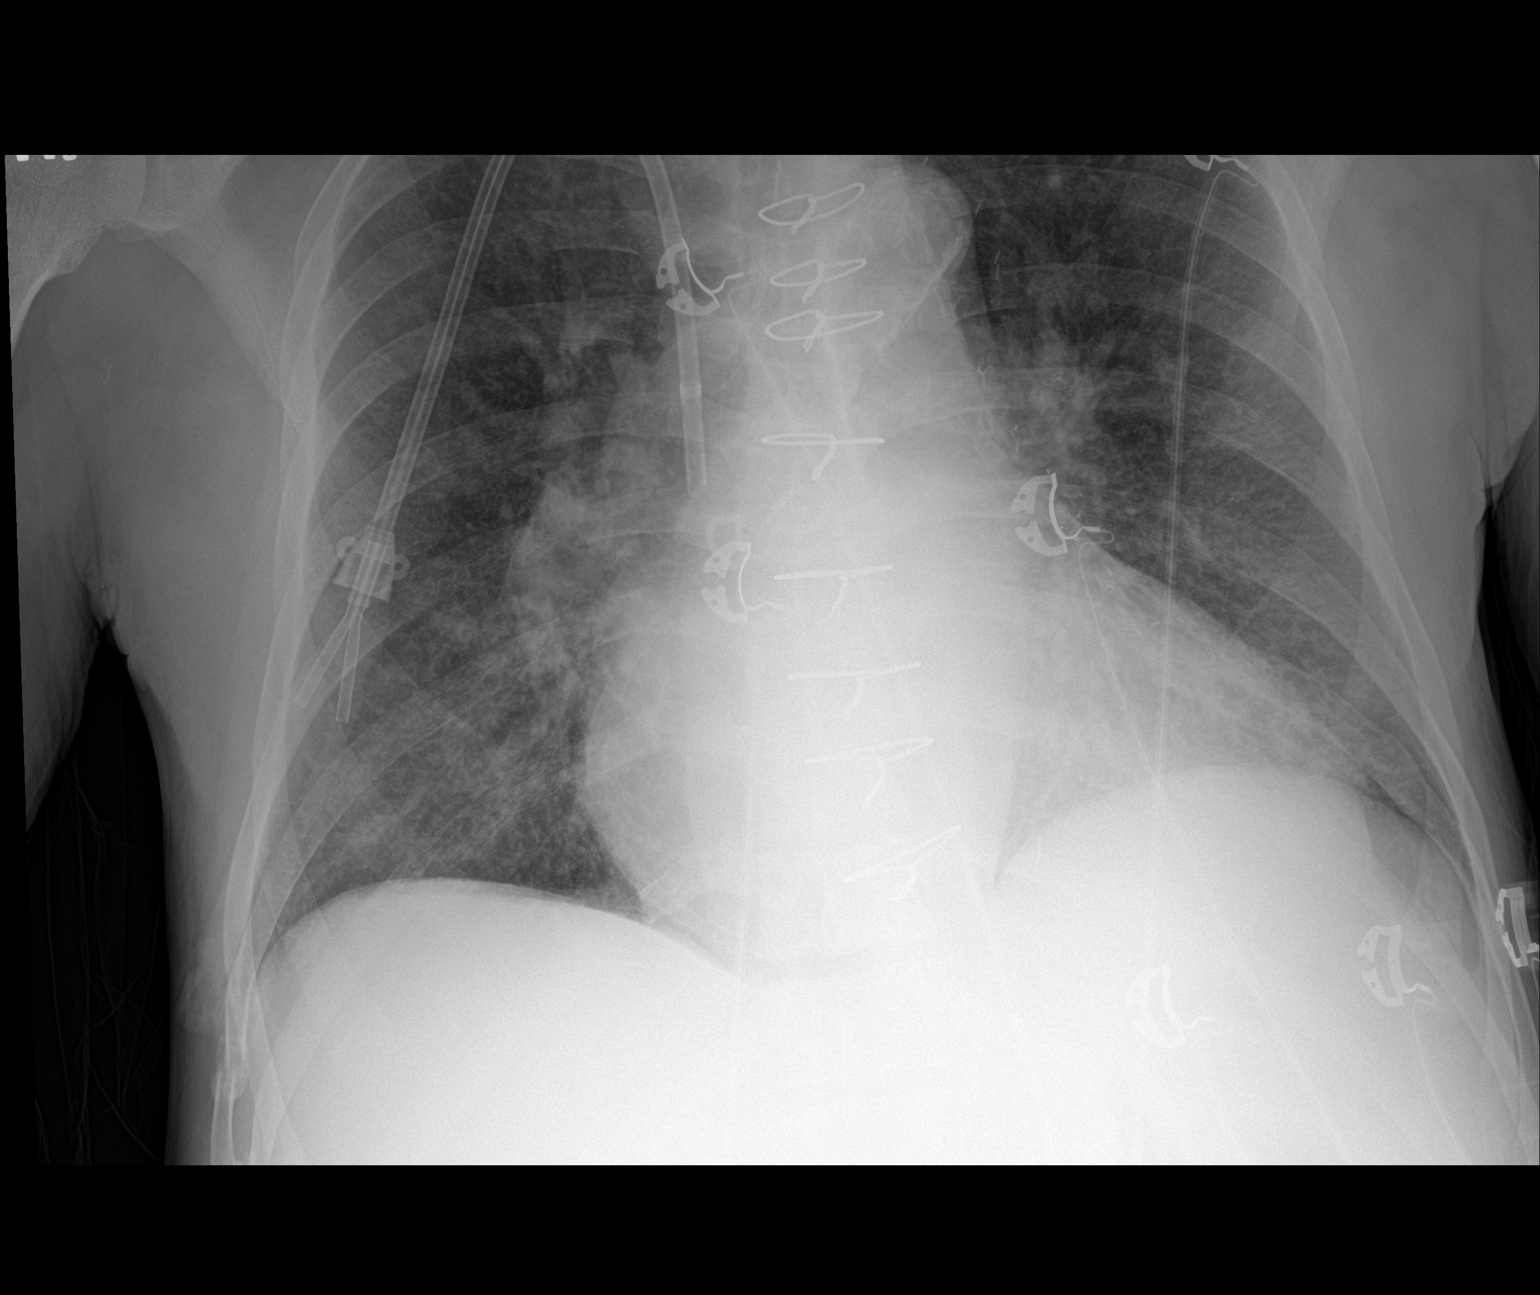

[2 of 2 positions shown; findings below may reference images not displayed]

FINDINGS: Stable cardiomegaly. Atherosclerosis of thoracic aorta is noted.
Right internal jugular dialysis catheter is unchanged in position.
No pneumothorax or pleural effusion is noted. No acute pulmonary
disease is noted. Mild central pulmonary vascular congestion is
noted. Bony thorax is unremarkable.
IMPRESSION: Stable cardiomegaly with mild central pulmonary vascular congestion.

Aortic Atherosclerosis ([id number].N).

## 2020-02-11 IMAGING — CR DG CHEST 2V
2 series · 2 of 2 positions shown · non-contrast
Comparison: Chest radiograph June 30, 2017 and chest CT June 03, 2017

CLINICAL DATA: Cough for 2 days.  Renal failure.

EXAM:
CHEST - 2 VIEW

[chest pa]
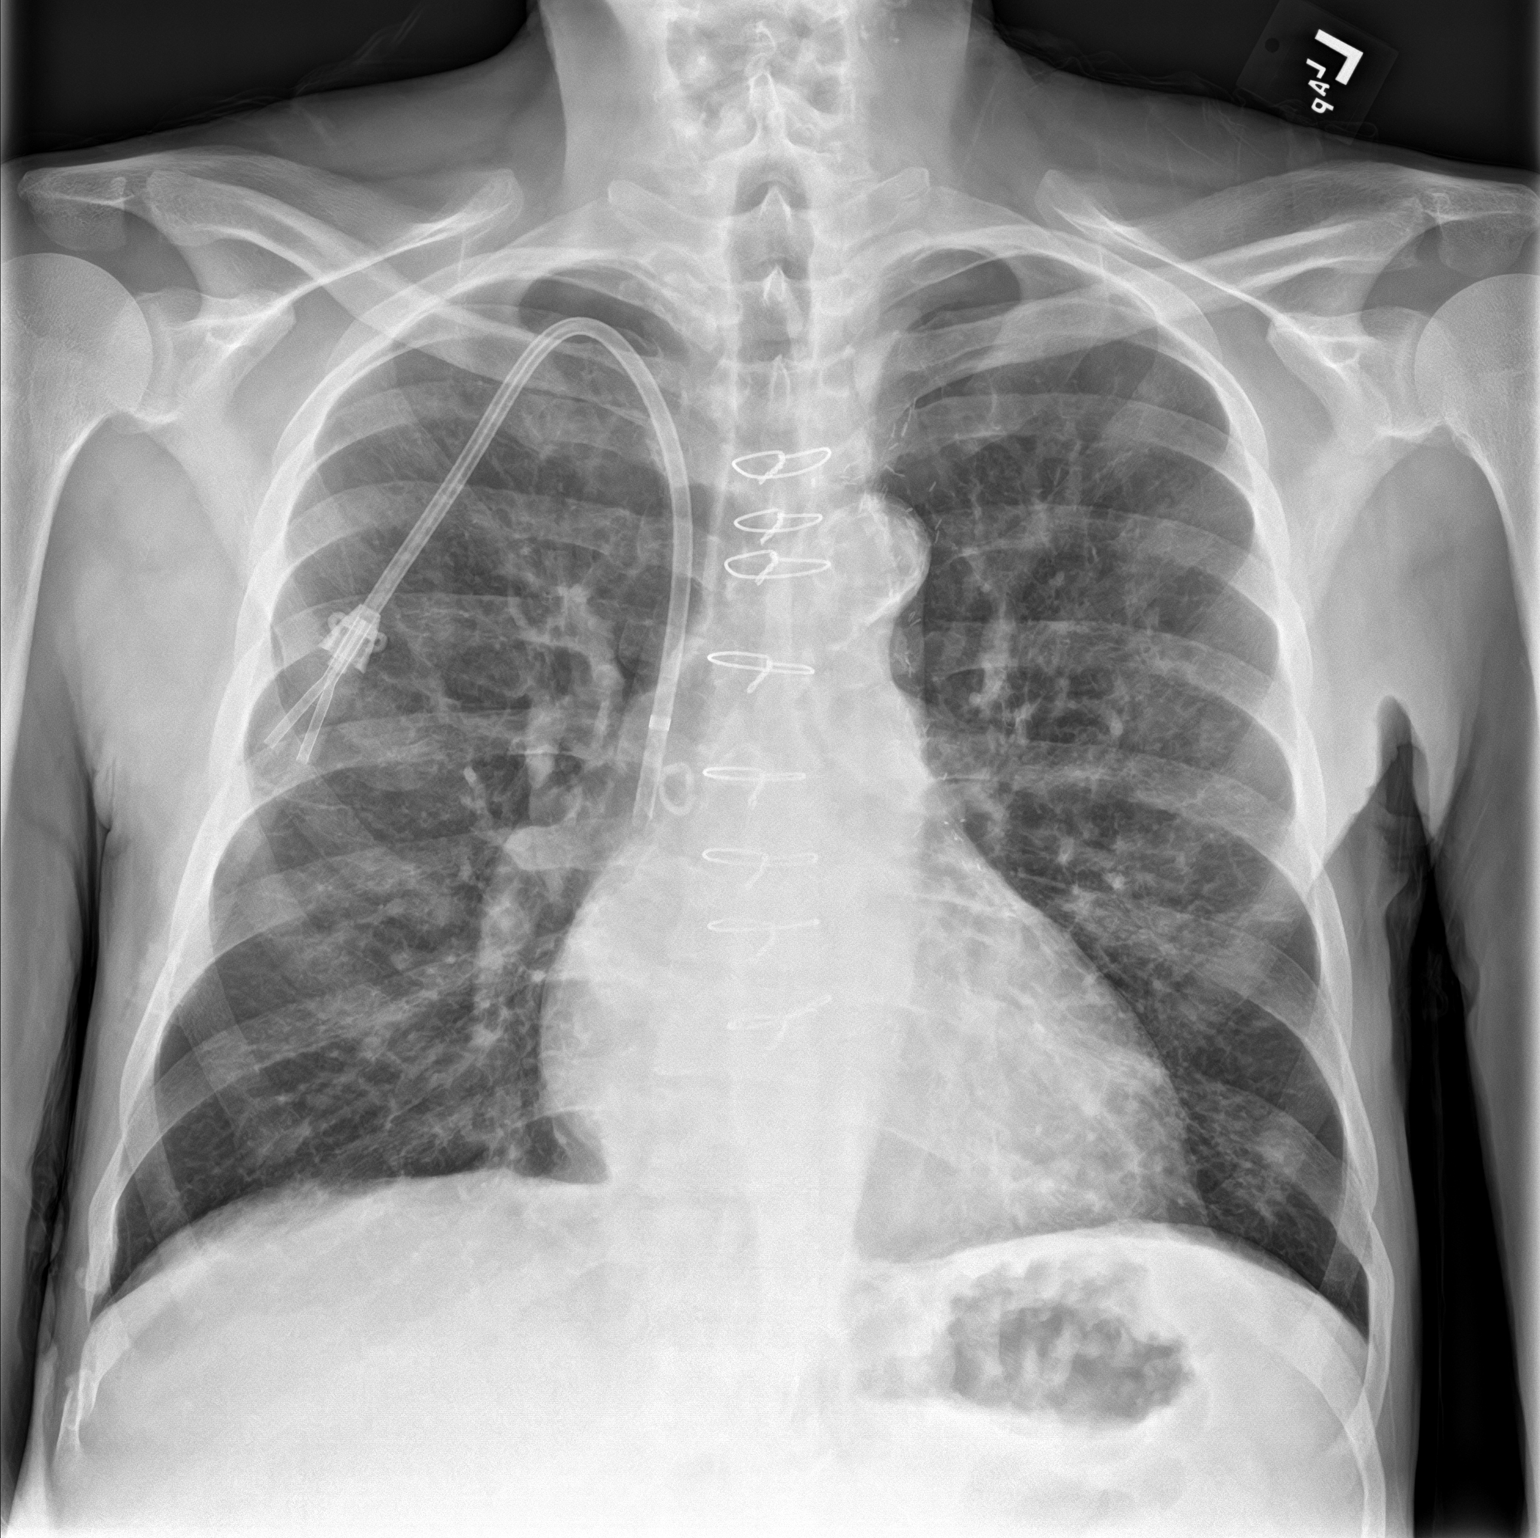

[chest lat]
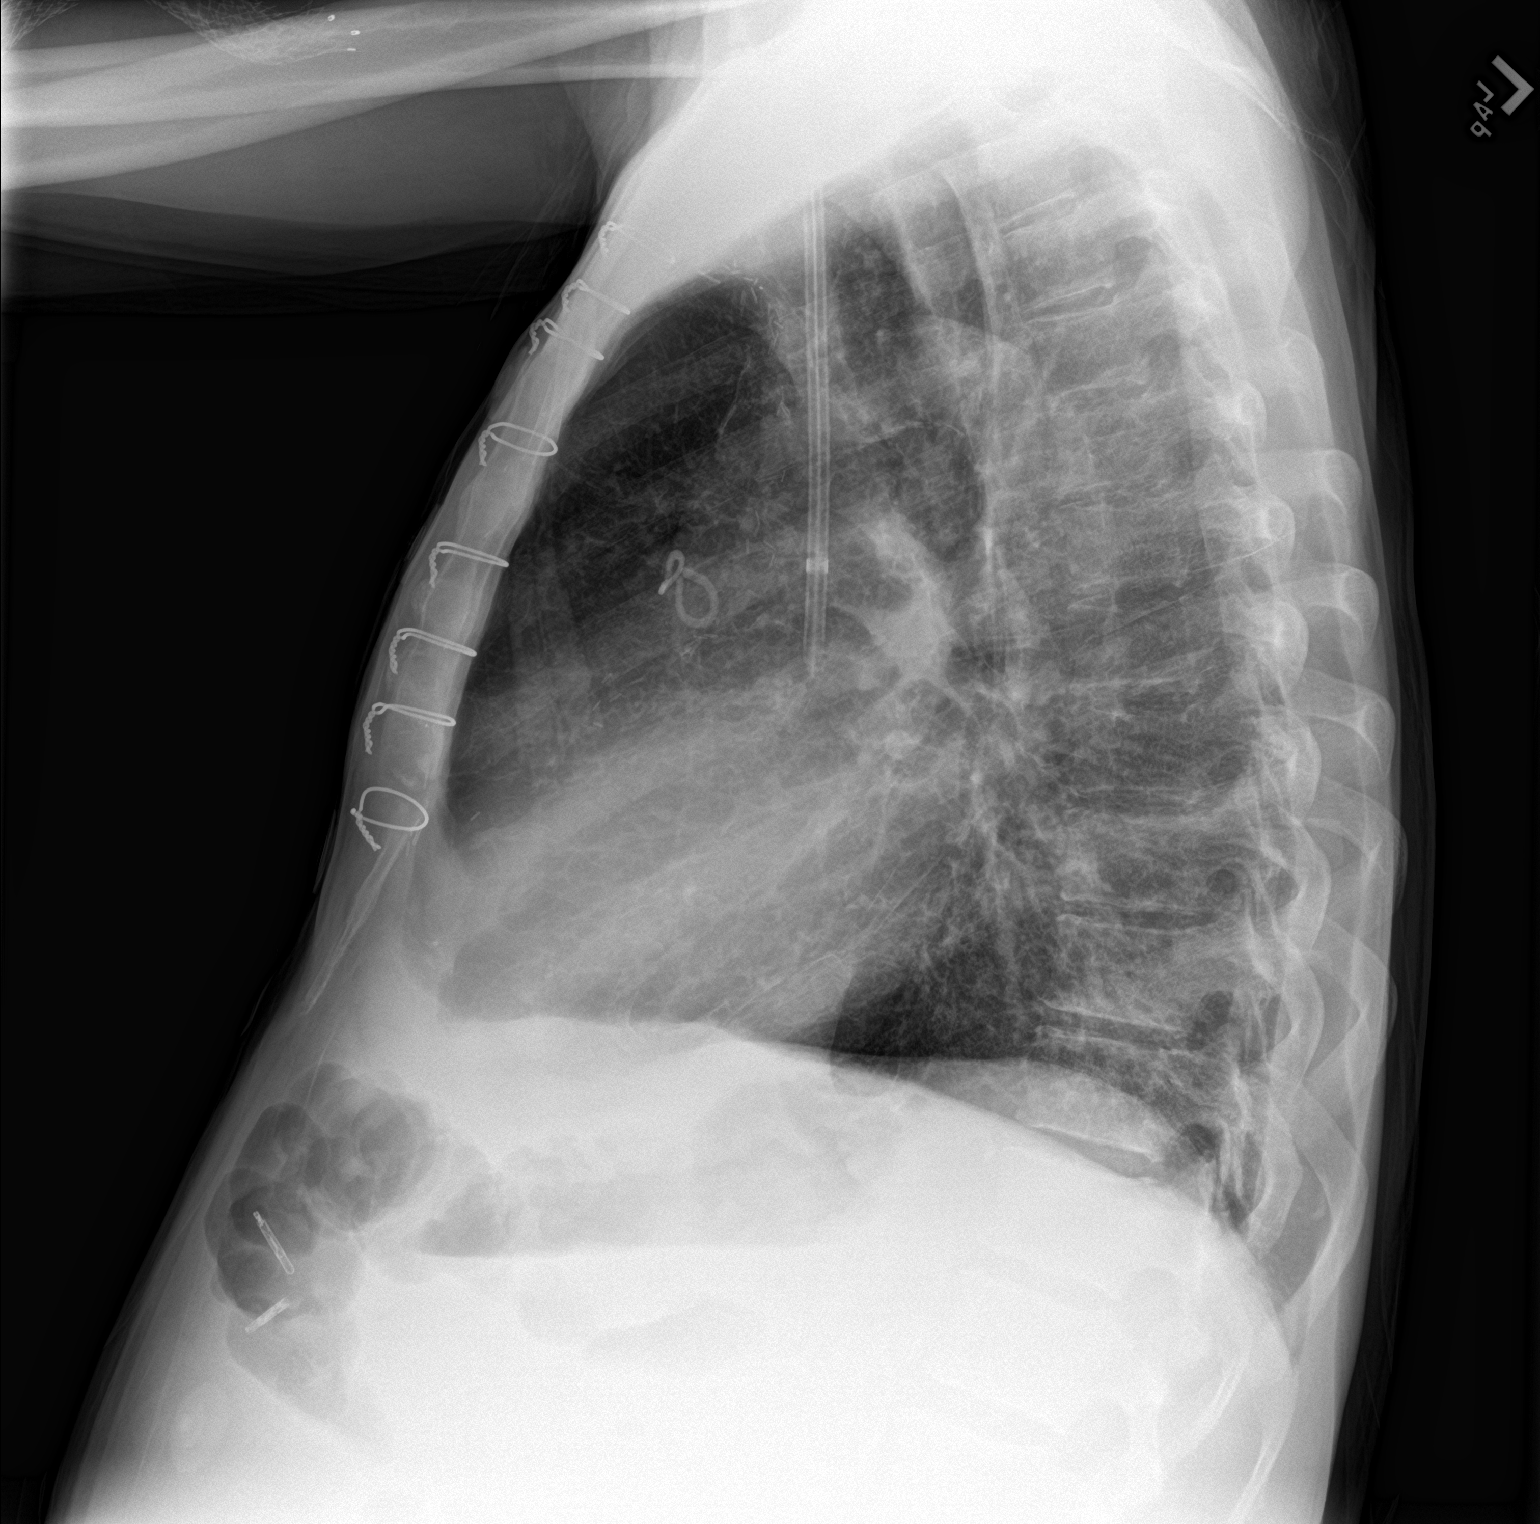

[2 of 2 positions shown; findings below may reference images not displayed]

FINDINGS: Central catheter tip is in the superior vena cava. No pneumothorax.
There is patchy airspace opacity throughout the lungs bilaterally,
similar to 10 days prior but less than on prior CT examination. No
frank consolidation is noted. Heart is mildly enlarged, stable, with
pulmonary vascularity within normal limits. Patient is status post
coronary artery bypass grafting. There is aortic atherosclerosis as
well as calcification in the left carotid artery.
IMPRESSION: Patchy airspace opacity bilaterally, significantly less than on
prior CT but similar to recent prior chest radiograph. Suspect
patchy pneumonia, although a degree of pulmonary edema could present
in this manner.

Stable cardiac prominence. There is aortic atherosclerosis. No
adenopathy evident.

Aortic Atherosclerosis ([id number]).

## 2020-02-15 IMAGING — US US PARACENTESIS
1 series · 5 of 5 positions shown · non-contrast
Comparison: none

INDICATION: Recurrent ascites.

[Series 1: us paracentesis · 5 of 5 slices shown]
[im 1/5]
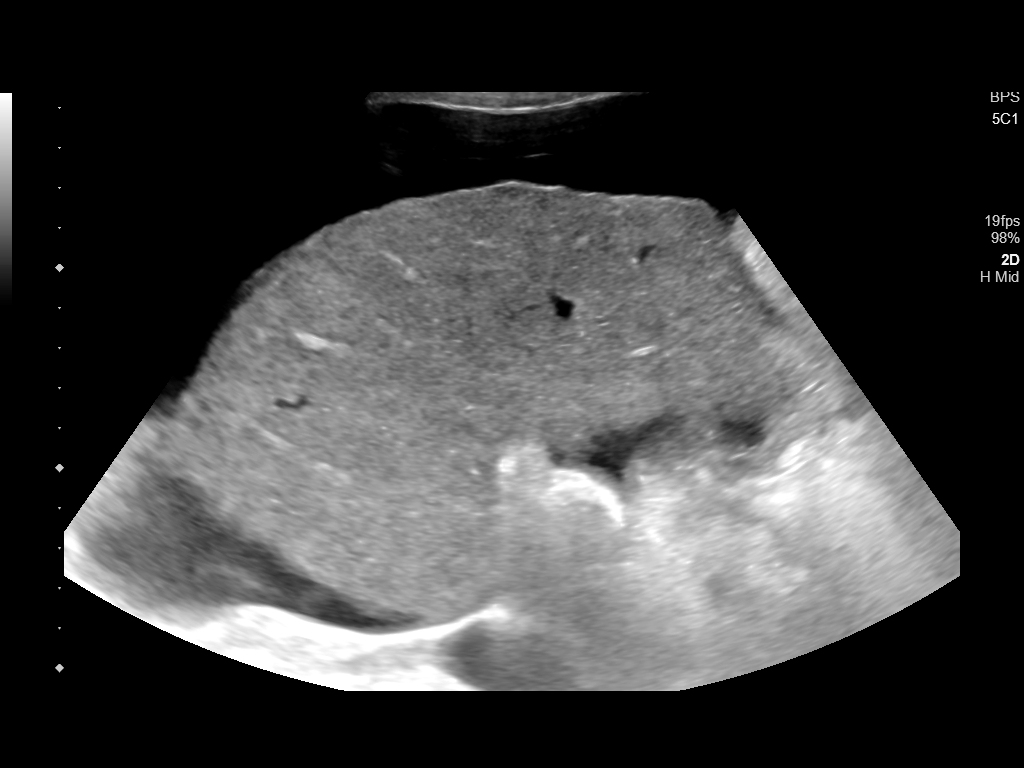
[im 2/5]
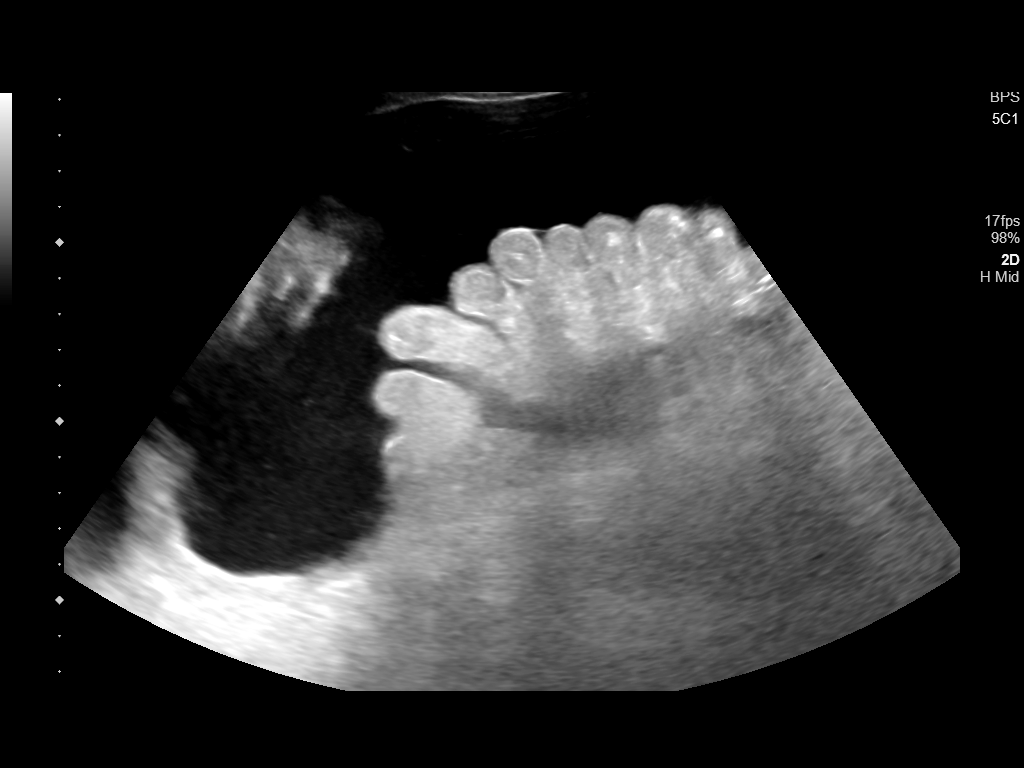
[im 3/5]
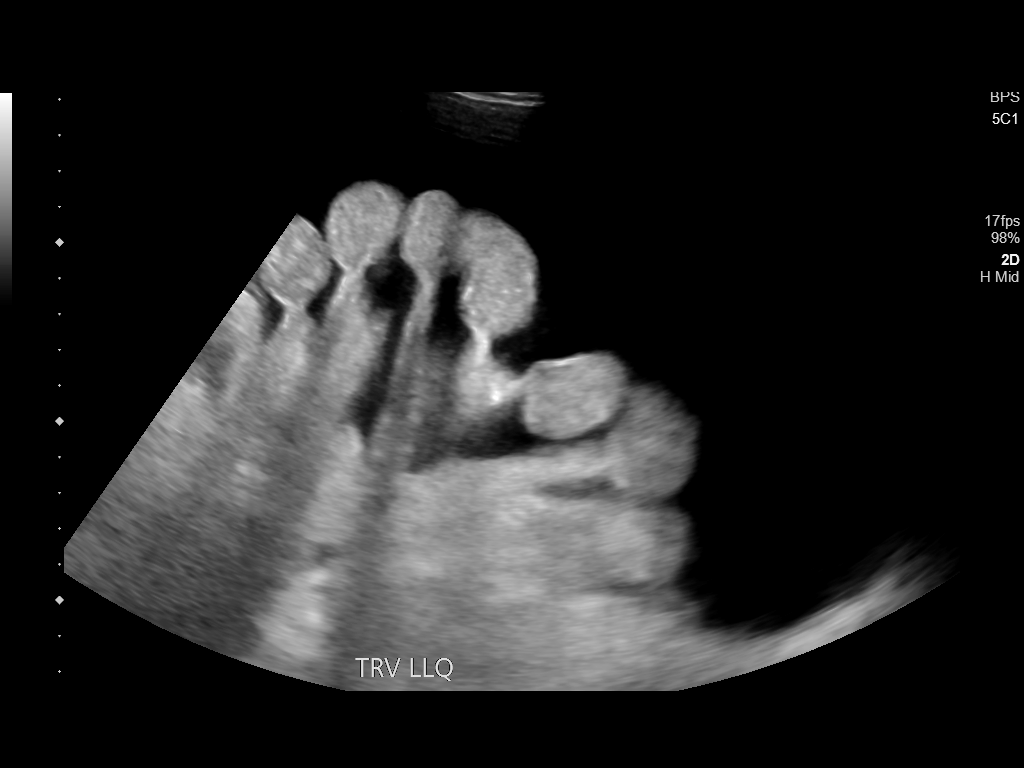
[im 4/5]
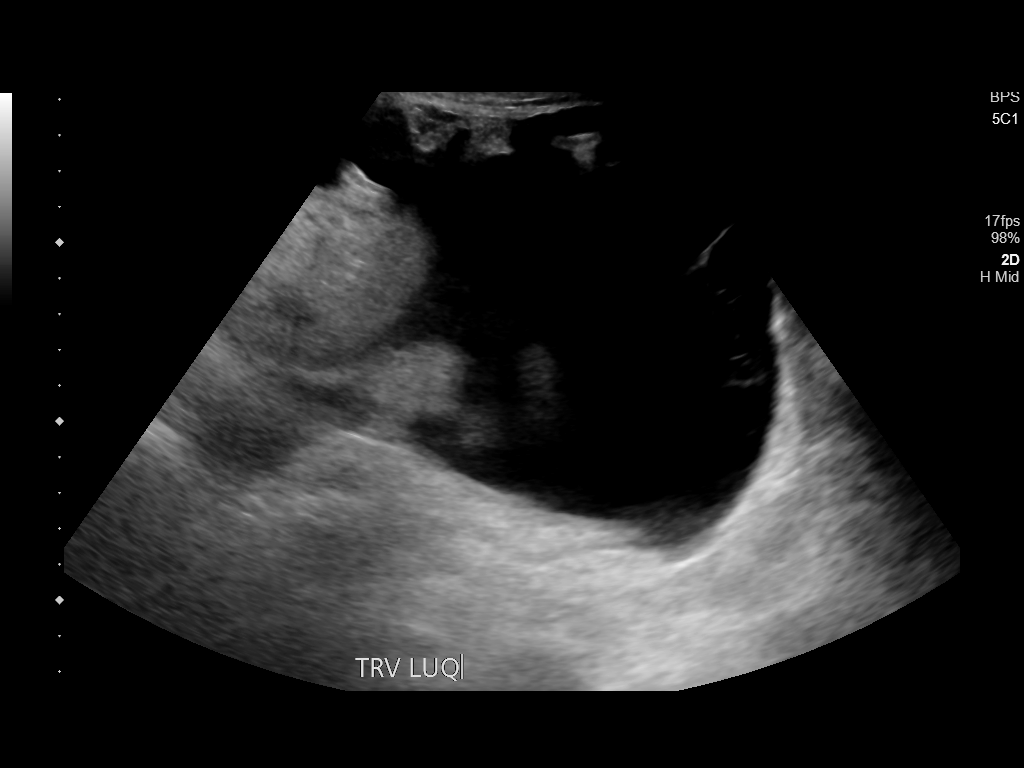
[im 5/5]
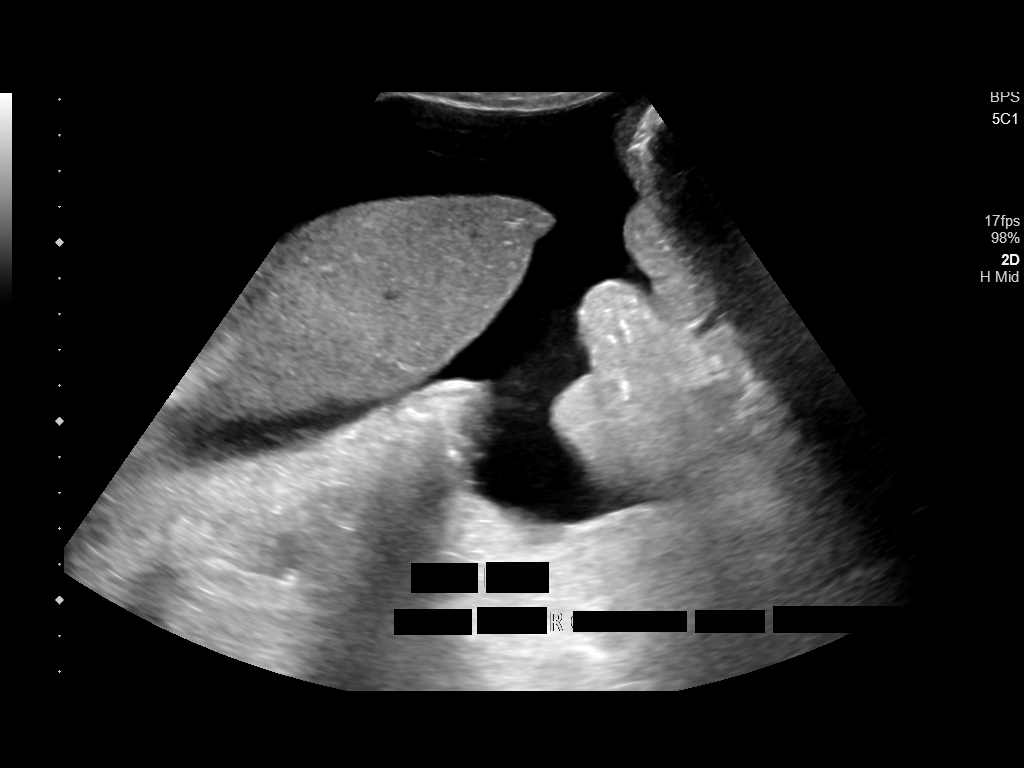

[5 of 5 positions shown; findings below may reference images not displayed]

EXAM:
ULTRASOUND GUIDED PARACENTESIS

MEDICATIONS:
None.

COMPLICATIONS:
None immediate.

PROCEDURE:
Informed written consent was obtained from the patient after a
discussion of the risks, benefits and alternatives to treatment. A
timeout was performed prior to the initiation of the procedure.

Initial ultrasound was performed to localize ascites. The right
lower abdomen was prepped and draped in the usual sterile fashion.
1% lidocaine was used for local anesthesia.

Following this, a 6 Fr Safe-T-Centesis catheter was introduced. An
ultrasound image was saved for documentation purposes. The
paracentesis was performed. The catheter was removed and a dressing
was applied. The patient tolerated the procedure well without
immediate post procedural complication.
FINDINGS: A total of approximately 5 L of yellow fluid was removed.
IMPRESSION: Successful ultrasound-guided paracentesis yielding 5 liters of
peritoneal fluid.

## 2020-02-19 IMAGING — CR DG CHEST 2V
1 series · 2 of 2 positions shown · non-contrast
Comparison: Chest x-ray dated March 13, 2018.

CLINICAL DATA: Shortness of breath and weakness.

EXAM:
CHEST - 2 VIEW

[Series 1: dg chest 2 view · 0.14mm/px · 2 of 2 slices shown]
[im 1/2]
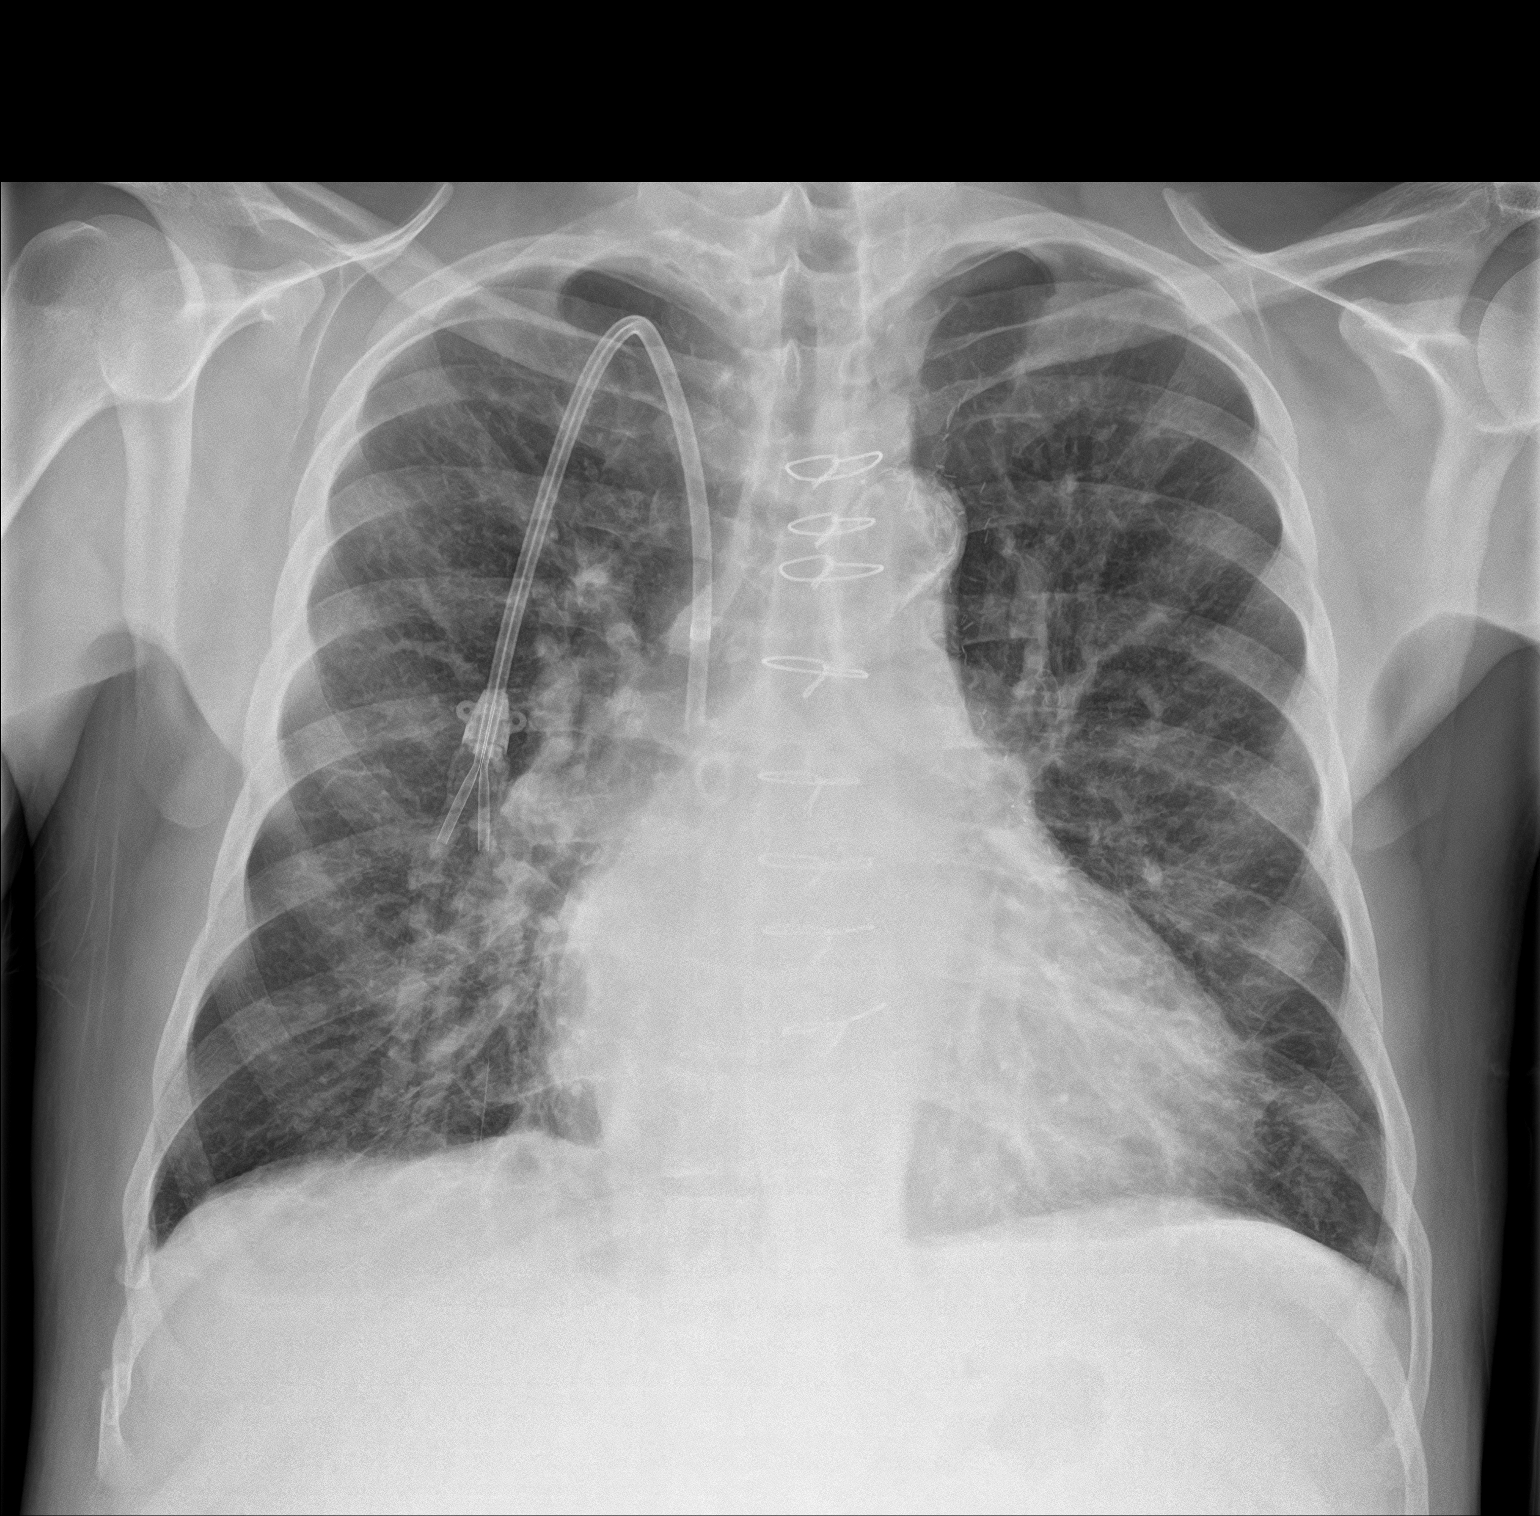
[im 2/2]
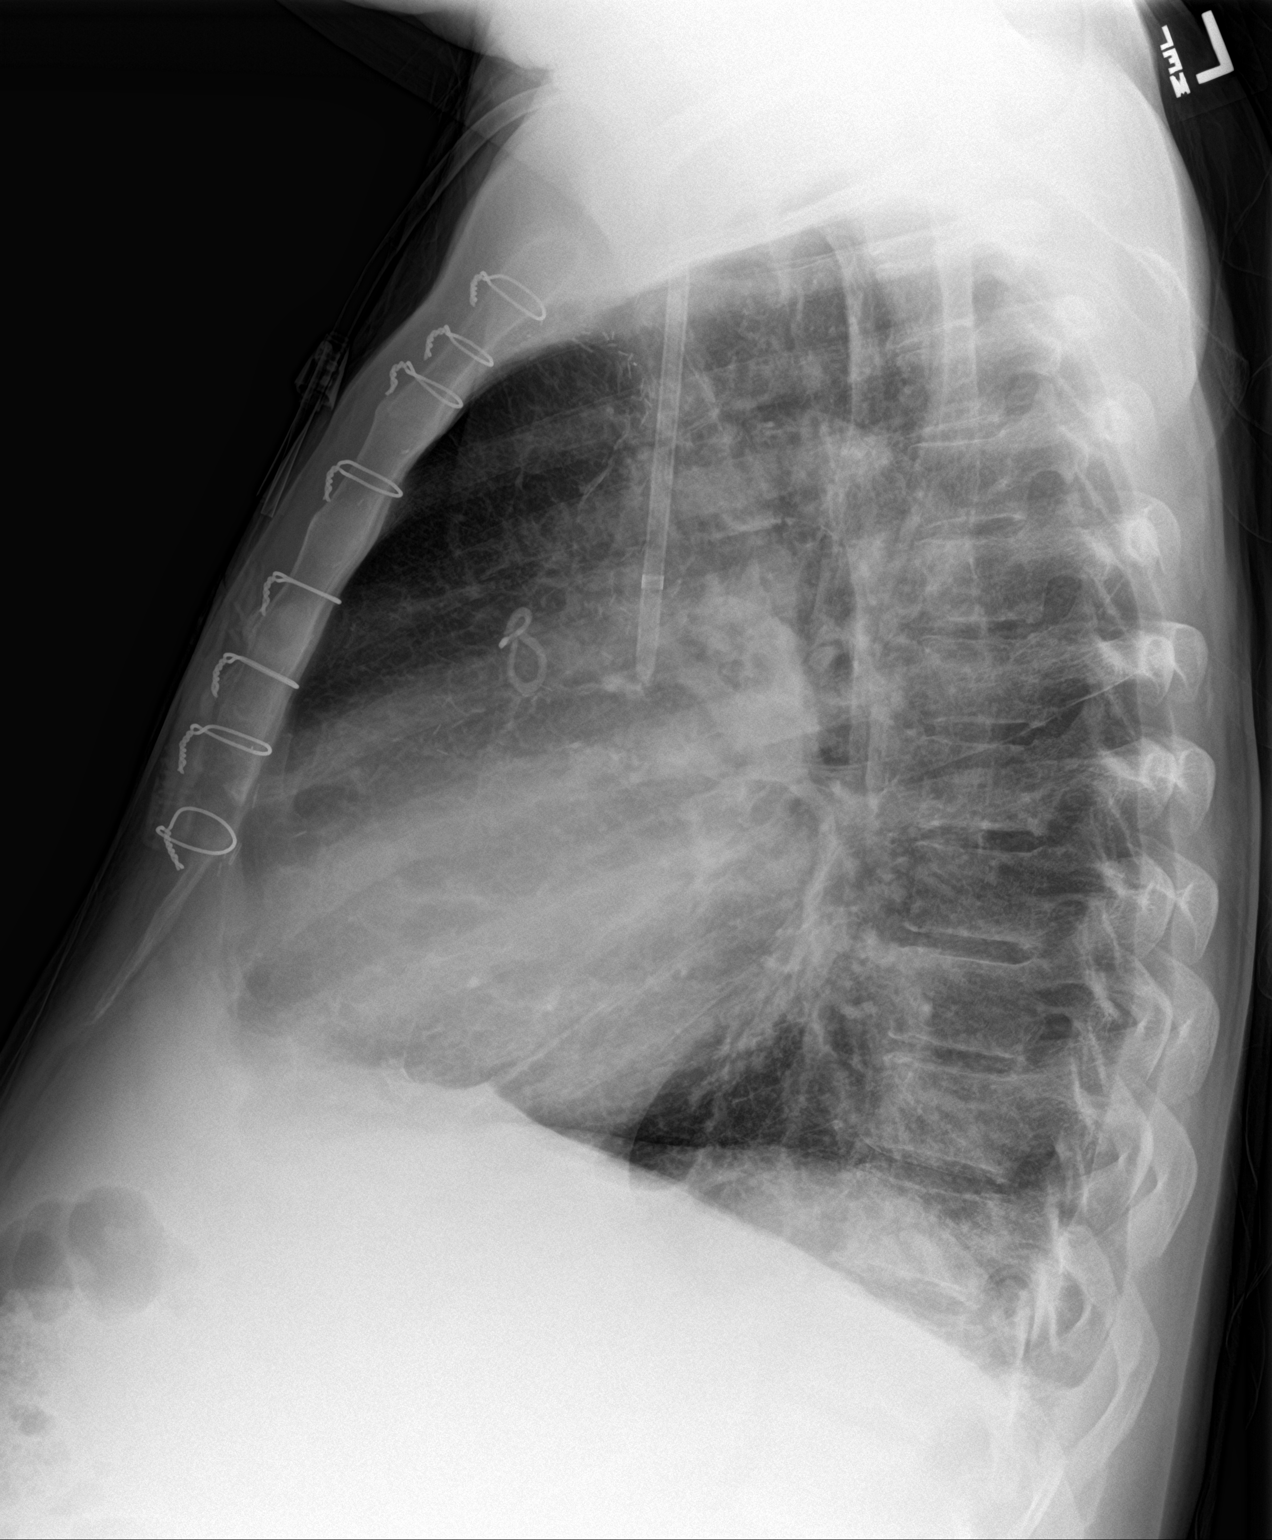

[2 of 2 positions shown; findings below may reference images not displayed]

FINDINGS: Unchanged tunneled right internal jugular dialysis catheter. Stable
cardiomegaly status post CABG. Mildly progressed diffuse
interstitial thickening. No focal consolidation, pleural effusion,
or pneumothorax. No acute osseous abnormality.
IMPRESSION: 1. Mildly progressed interstitial pulmonary edema.

## 2020-02-27 IMAGING — DX DG CHEST 1V PORT
2 series · 2 of 2 positions shown · non-contrast
Comparison: 03/26/2018

CLINICAL DATA: Shortness of breath, tightness in lower abdomen, 90%
oxygen saturation on room air at arrival

EXAM:
PORTABLE CHEST 1 VIEW

[chest ap (1 of 2)]
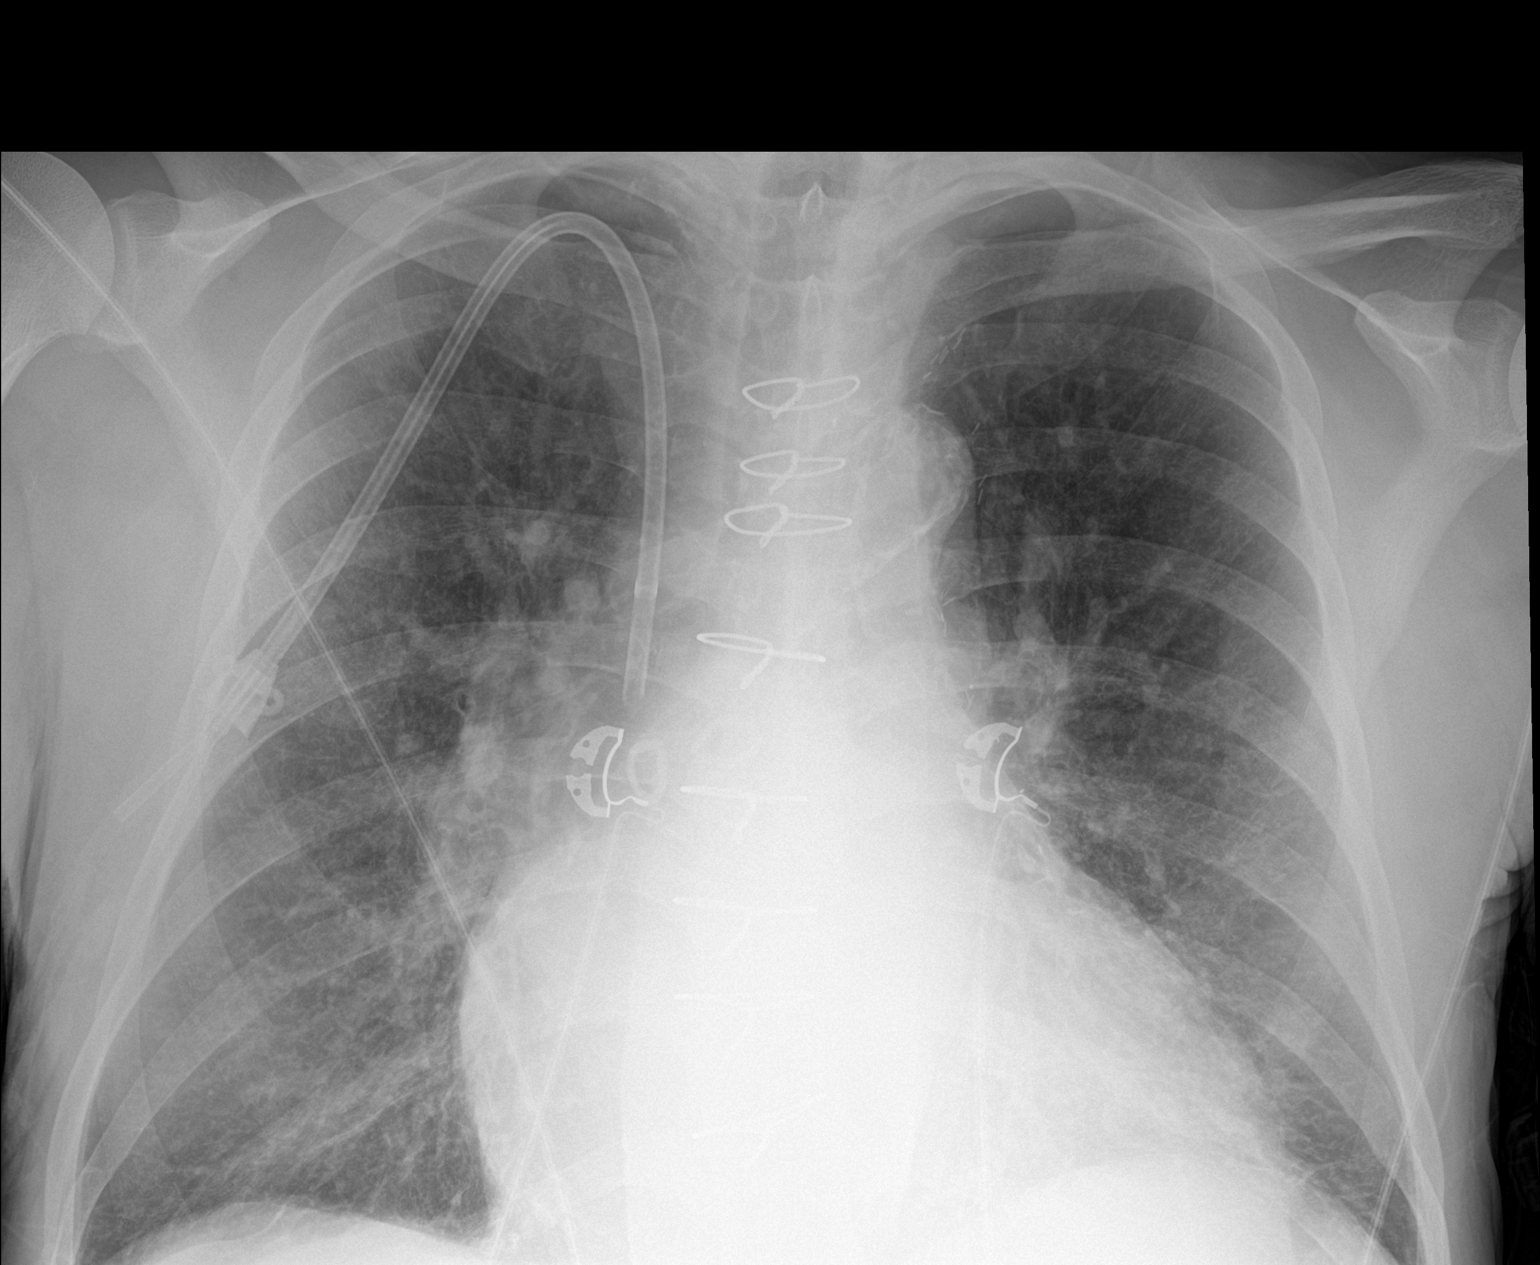

[chest ap (2 of 2)]
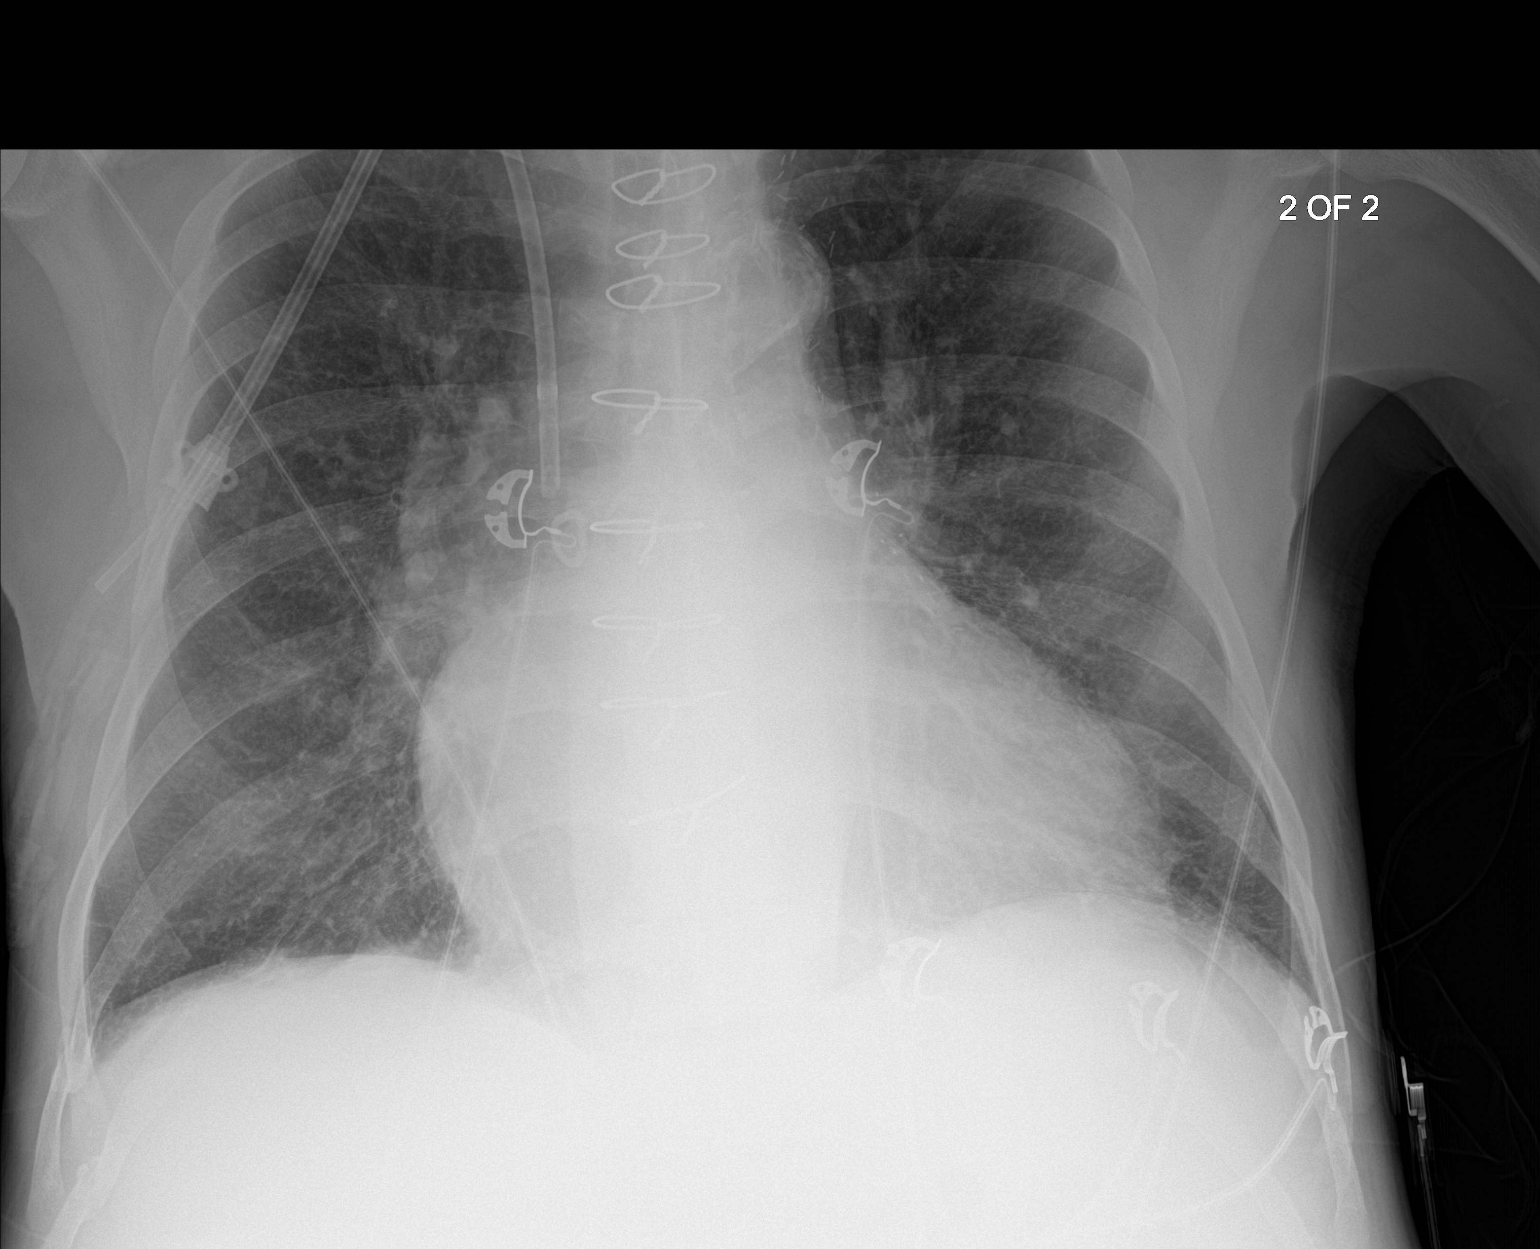

[2 of 2 positions shown; findings below may reference images not displayed]

FINDINGS: RIGHT jugular central venous catheter with tip projecting over SVC.

Enlargement of cardiac silhouette post CABG.

Slight pulmonary vascular congestion.

Atherosclerotic calcifications aorta.

Persistent interstitial prominence which likely represents mild
persistent pulmonary edema.

Minimal bibasilar atelectasis.

No gross pleural effusion or pneumothorax.

Healing fractures of the lateral RIGHT ninth and tenth ribs.
IMPRESSION: Enlargement of cardiac silhouette with pulmonary vascular congestion
and probable mild pulmonary edema.

Healing fractures of the lateral RIGHT ninth and tenth ribs.

## 2020-03-03 IMAGING — US US PARACENTESIS
1 series · 5 of 5 positions shown · non-contrast
Comparison: none

INDICATION: Cirrhosis, ascites and abdominal distension

[Series 1: us paracentesis · 0.25mm/px · 5 of 5 slices shown]
[im 1/5]
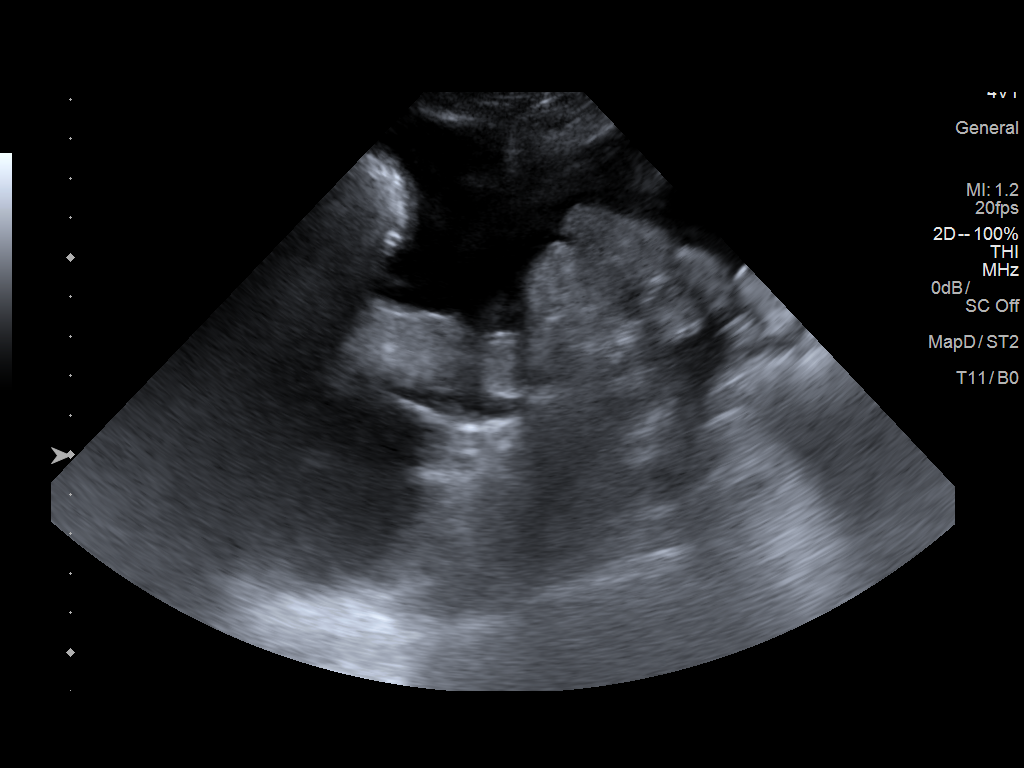
[im 2/5]
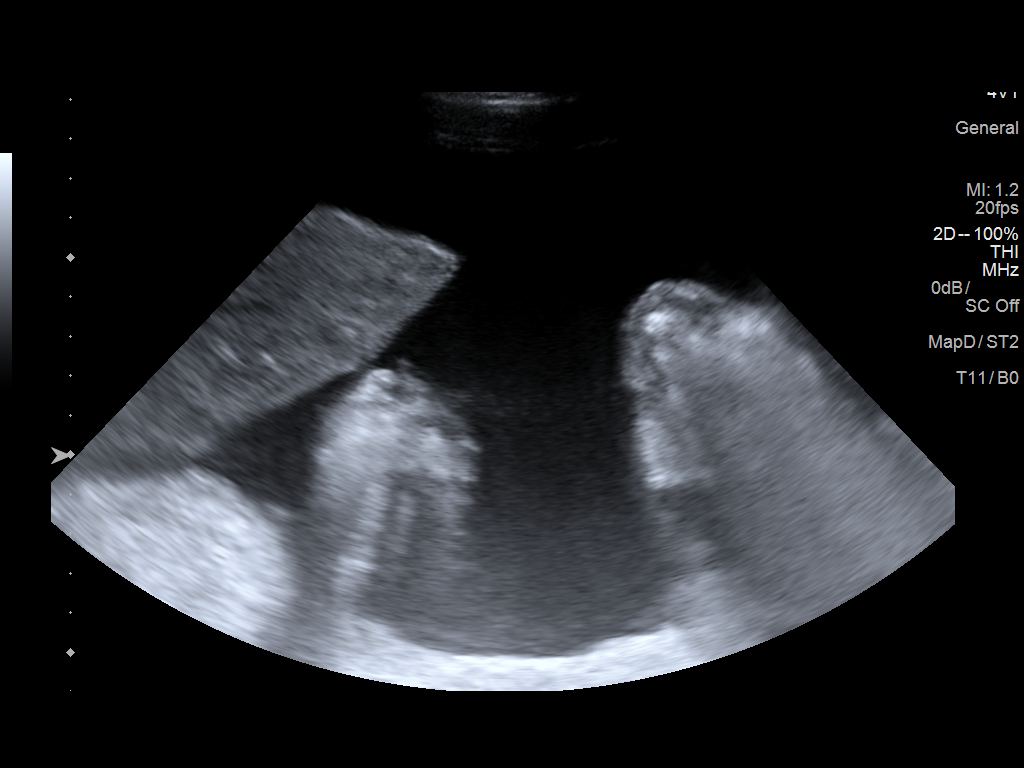
[im 3/5]
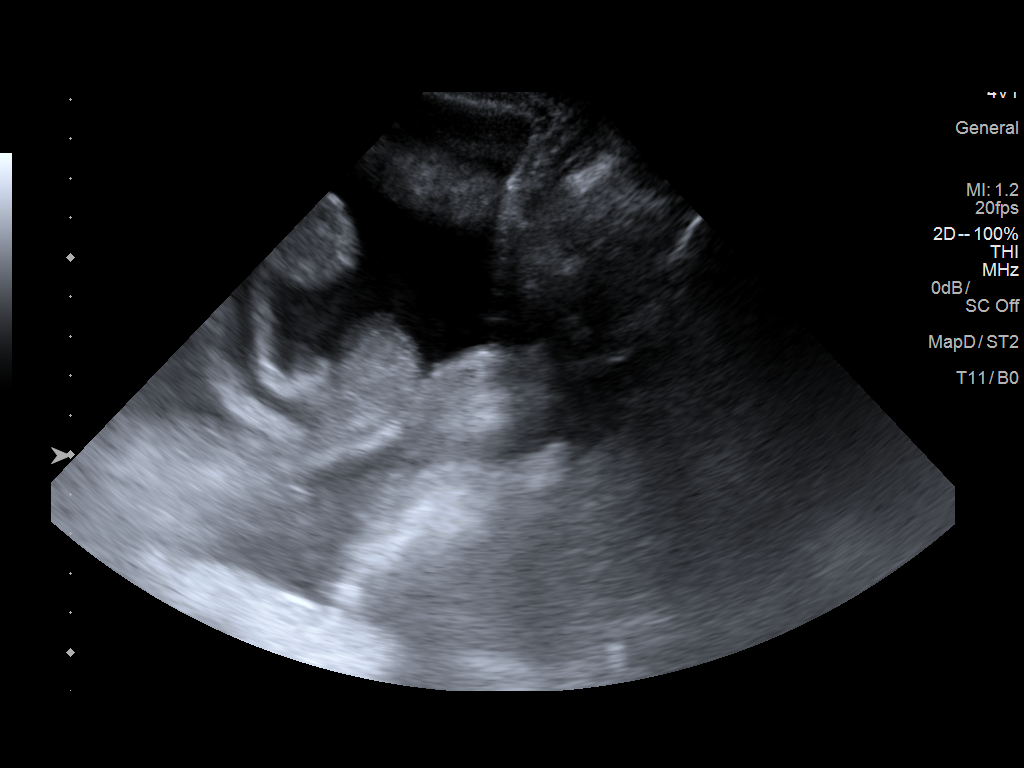
[im 4/5]
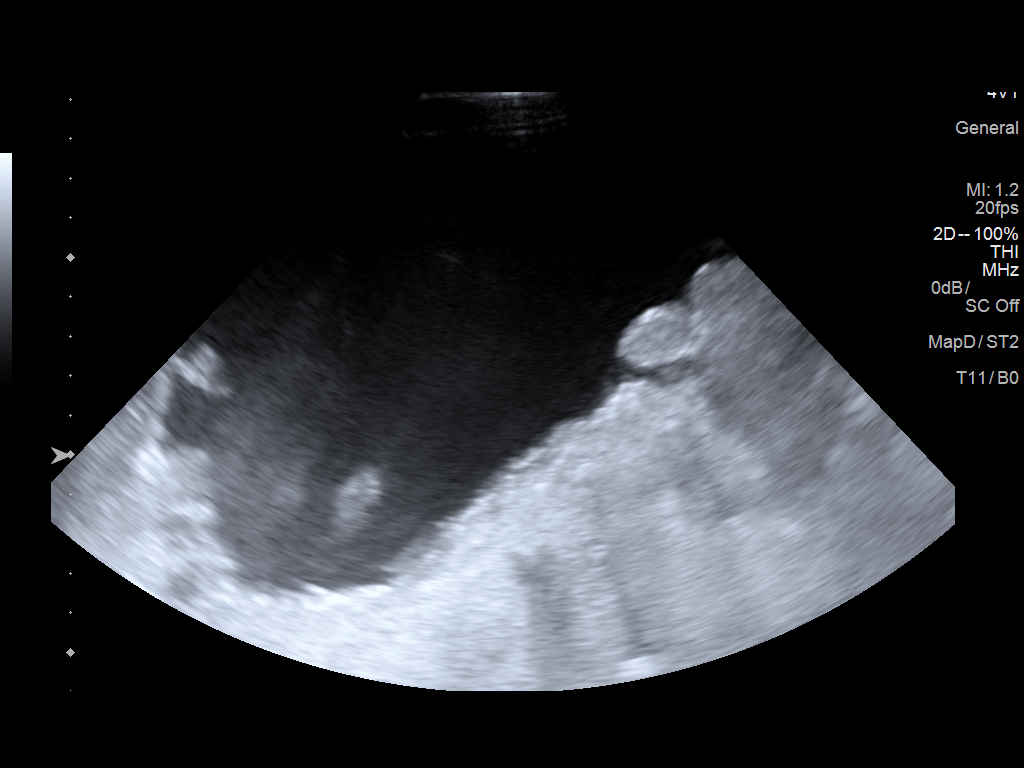
[im 5/5]
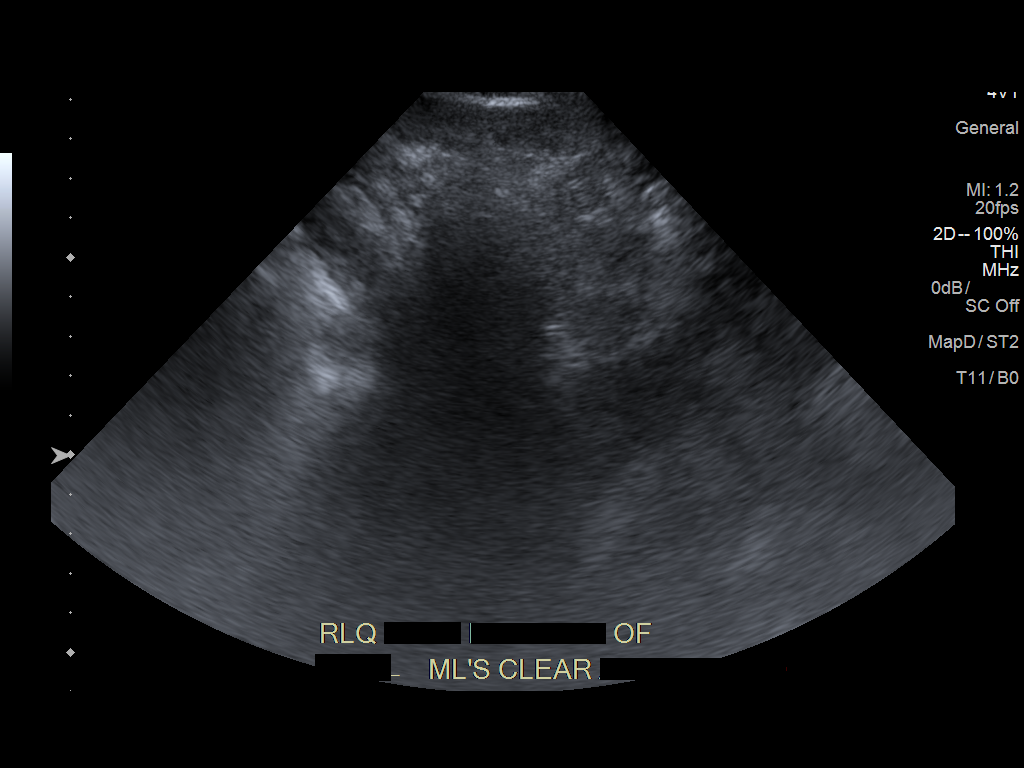

[5 of 5 positions shown; findings below may reference images not displayed]

EXAM:
ULTRASOUND GUIDED RIGHT PARACENTESIS

MEDICATIONS:
1% lidocaine local

COMPLICATIONS:
None immediate.

PROCEDURE:
Informed written consent was obtained from the patient after a
discussion of the risks, benefits and alternatives to treatment. A
timeout was performed prior to the initiation of the procedure.

Initial ultrasound scanning demonstrates a large amount of ascites
within the right lower abdominal quadrant. The right lower abdomen
was prepped and draped in the usual sterile fashion. 1% lidocaine
with epinephrine was used for local anesthesia.

Following this, a 6 Fr Safe-T-Centesis catheter was introduced. An
ultrasound image was saved for documentation purposes. The
paracentesis was performed. The catheter was removed and a dressing
was applied. The patient tolerated the procedure well without
immediate post procedural complication.
FINDINGS: A total of approximately 6 L of clear peritoneal fluid was removed.
Sample was not sent for laboratory analysis
IMPRESSION: Successful ultrasound-guided paracentesis yielding 6 liters of
peritoneal fluid.

## 2020-03-03 IMAGING — DX DG CHEST 1V PORT
2 series · 2 of 2 positions shown · non-contrast
Comparison: 04/03/2018

CLINICAL DATA: Shortness of breath, recent missed dialysis
treatment

EXAM:
PORTABLE CHEST 1 VIEW

[chest ap (1 of 2)]
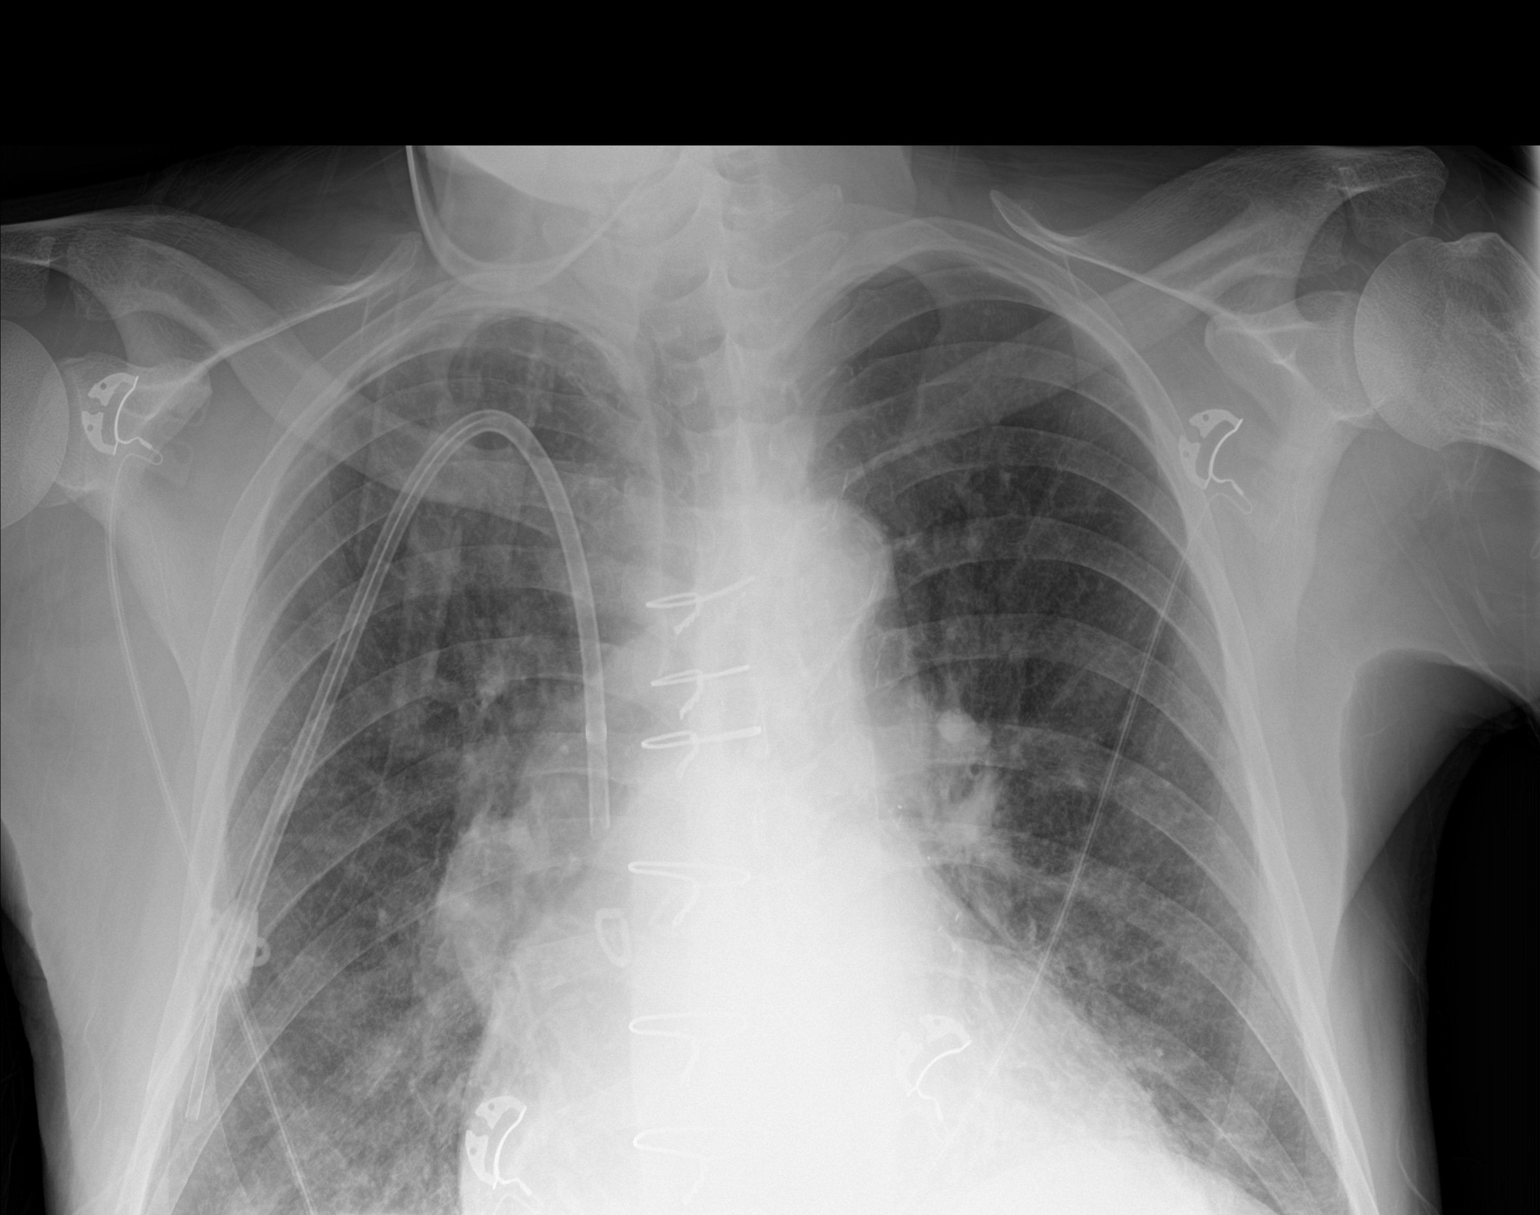

[chest ap (2 of 2)]
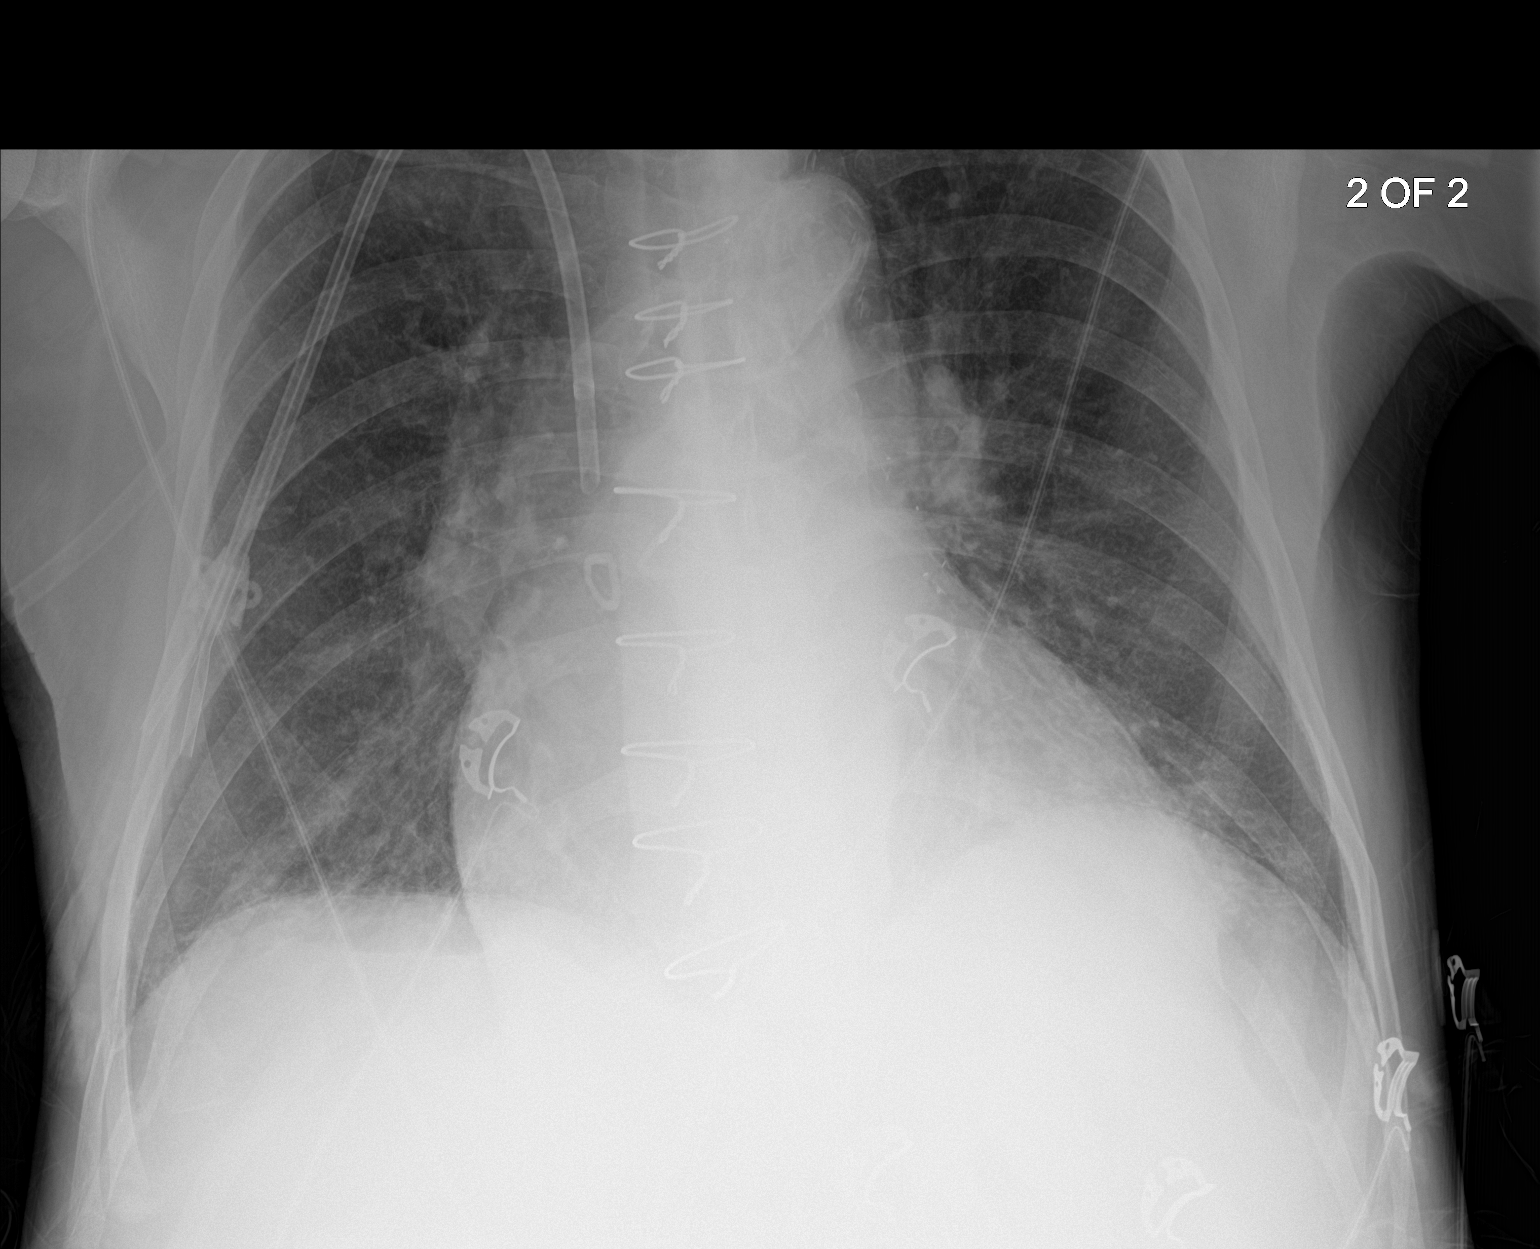

[2 of 2 positions shown; findings below may reference images not displayed]

FINDINGS: Cardiac shadow remains enlarged. Postsurgical changes are again
seen. Dialysis catheter is again noted. Increase in central vascular
congestion is noted when compared with the prior study. No focal
infiltrate or effusion is seen.
IMPRESSION: Slight increase in central vascular congestion consistent with
volume overload likely related to the recently missed dialysis
session.

## 2020-03-04 IMAGING — DX DG CHEST 1V PORT
1 series · 1 of 1 positions shown · non-contrast
Comparison: 04/08/2018 and 04/03/2018 and 03/26/2018

CLINICAL DATA: Shortness of breath.

EXAM:
PORTABLE CHEST 1 VIEW

[chest ap]
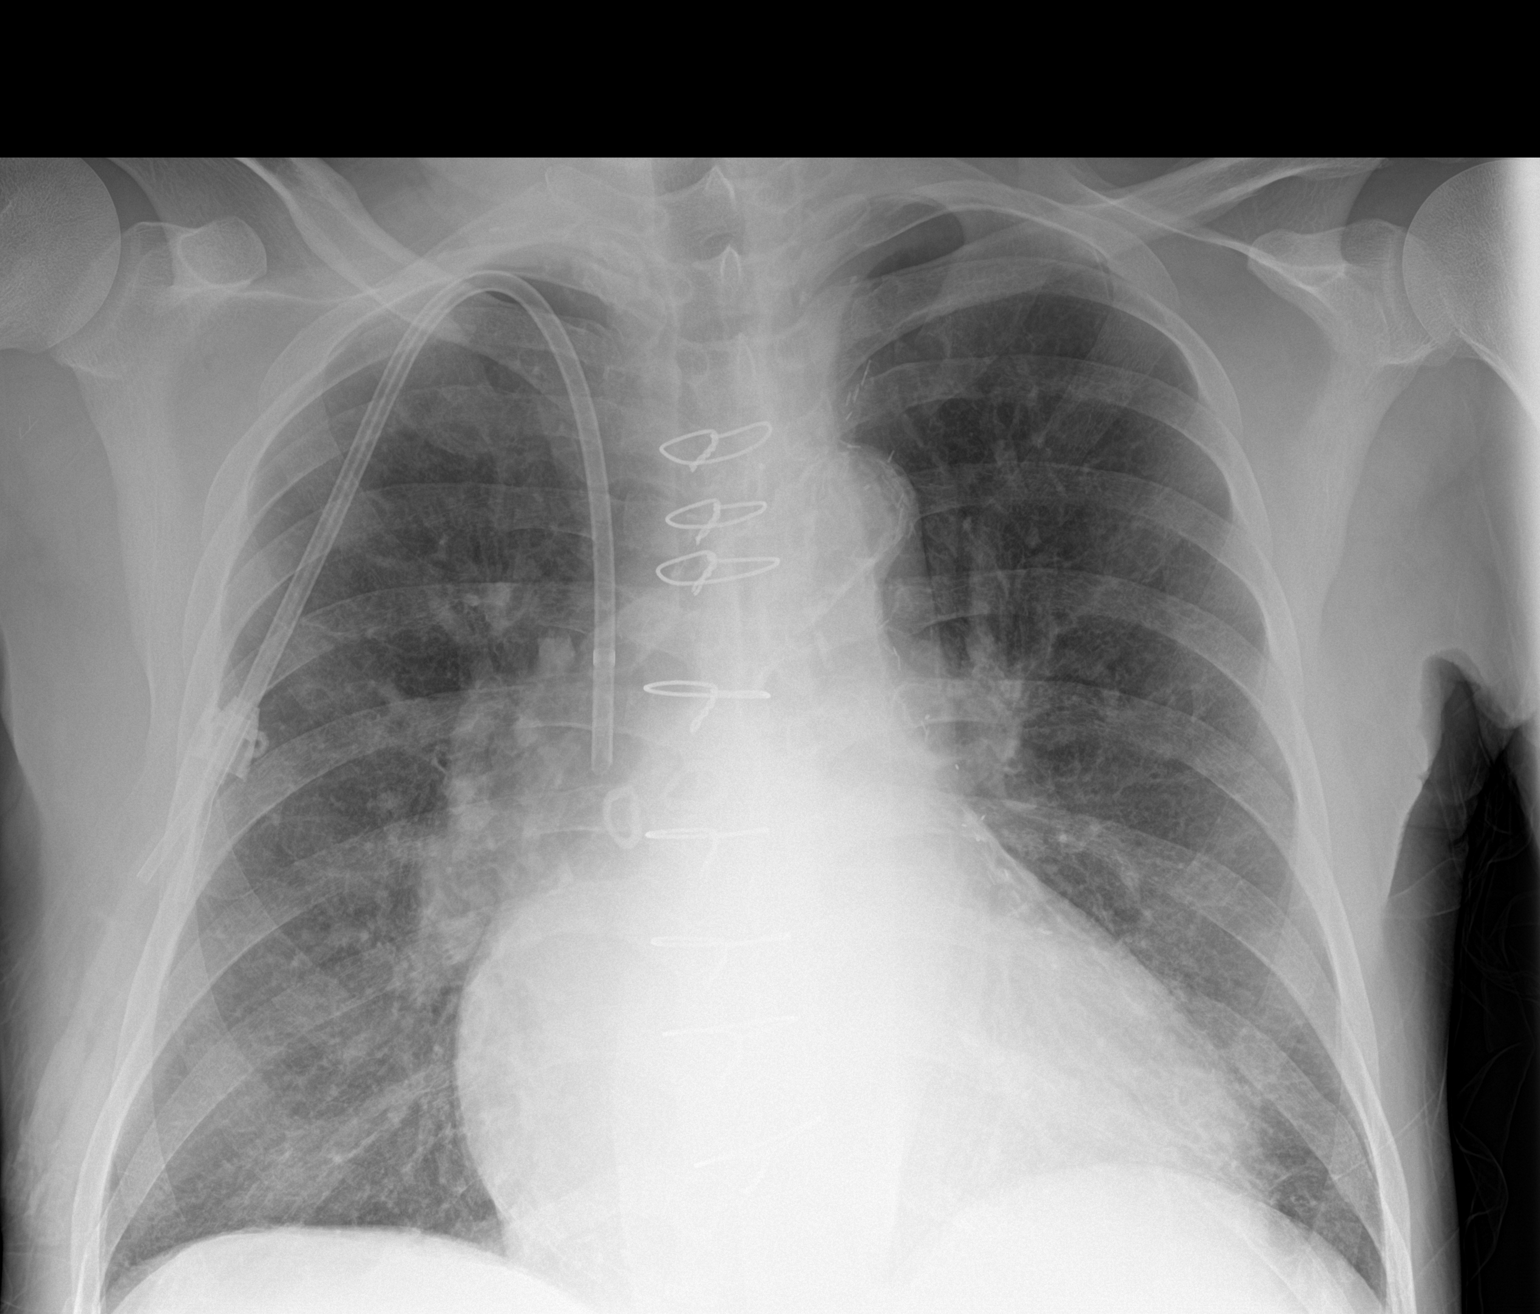

[1 of 1 positions shown; findings below may reference images not displayed]

FINDINGS: There is chronic cardiomegaly. Slight pulmonary vascular congestion,
unchanged. Central venous catheter in place, unchanged. No pulmonary
edema. No effusions.

Aortic atherosclerosis. CABG.
IMPRESSION: 1. Chronic cardiomegaly with mild chronic pulmonary vascular
congestion. No significant change since the prior exam.
2.  Aortic Atherosclerosis ([id number]).

## 2020-03-10 IMAGING — US US PARACENTESIS
1 series · 5 of 5 positions shown · non-contrast
Comparison: none

INDICATION: 57-year-old with recurrent ascites.

[Series 1: us paracentesis · 0.28mm/px · 5 of 5 slices shown]
[im 1/5]
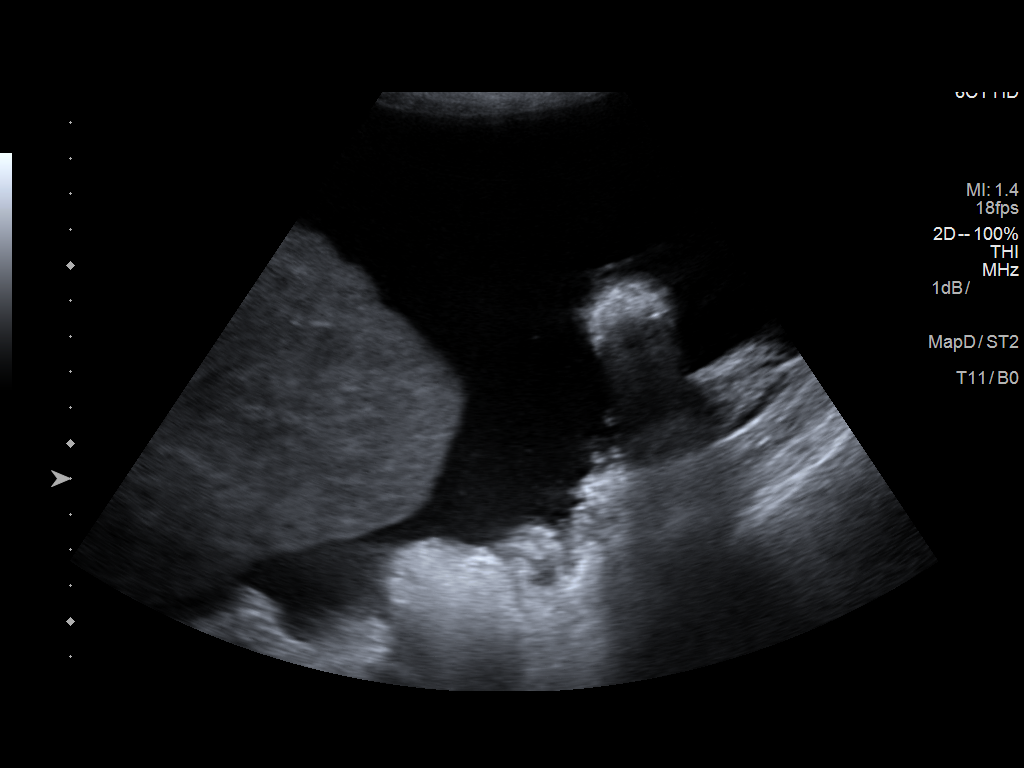
[im 2/5]
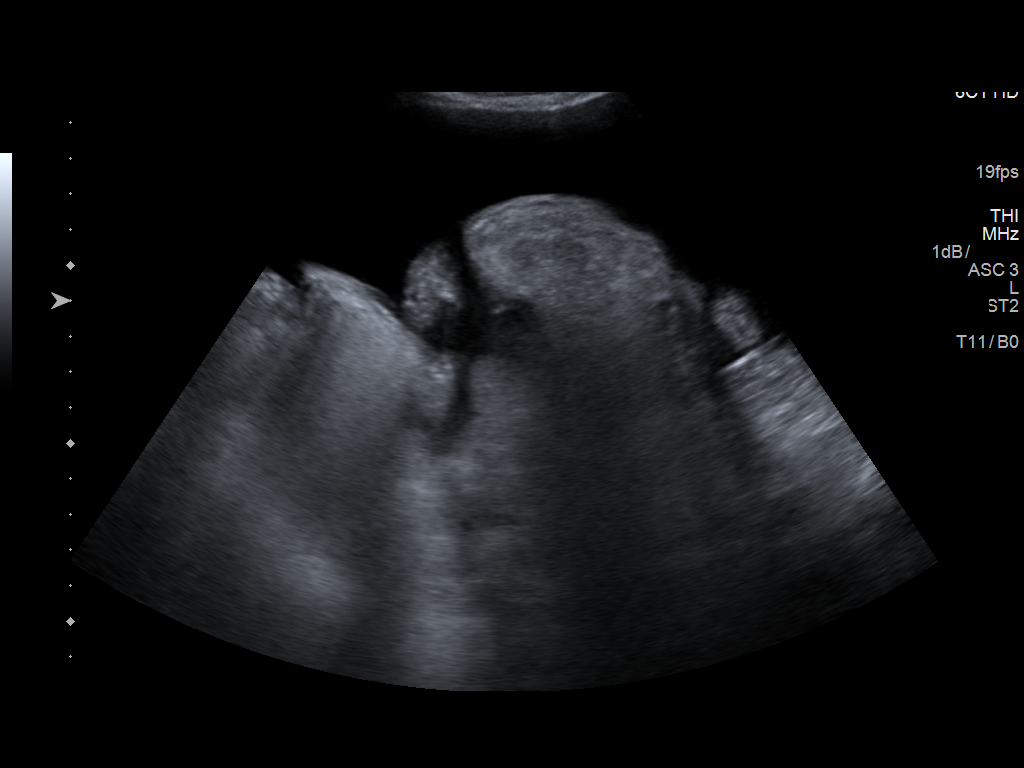
[im 3/5]
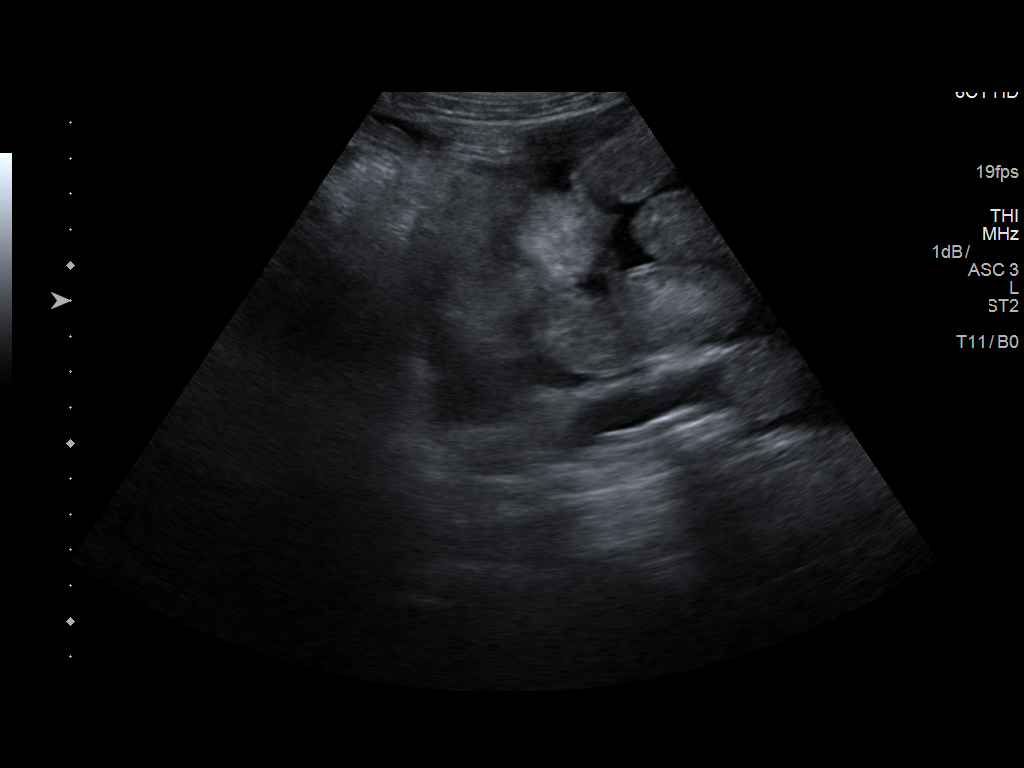
[im 4/5]
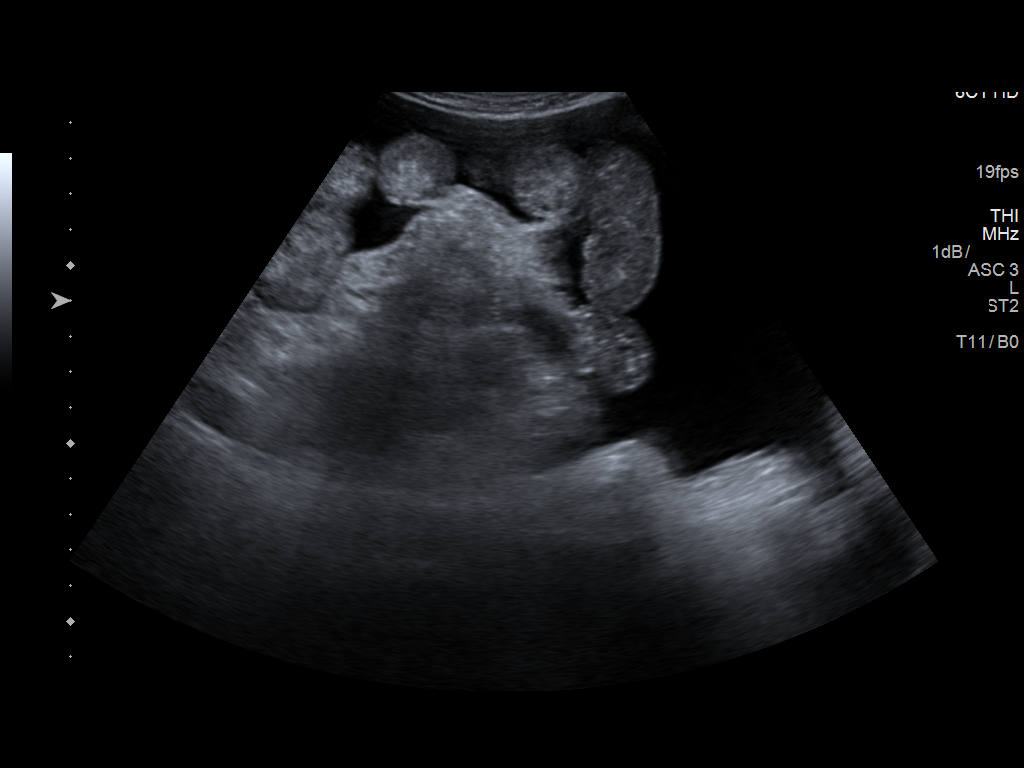
[im 5/5]
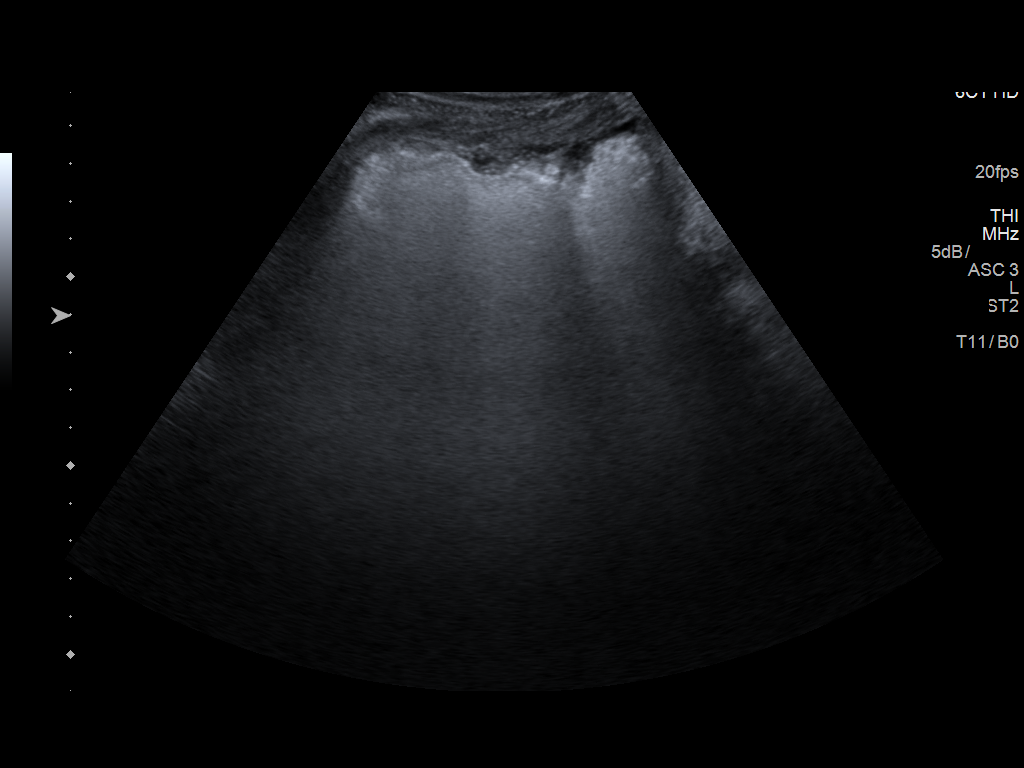

[5 of 5 positions shown; findings below may reference images not displayed]

EXAM:
ULTRASOUND GUIDED THERAPEUTIC PARACENTESIS

MEDICATIONS:
None.

COMPLICATIONS:
None immediate.

PROCEDURE:
Informed written consent was obtained from the patient after a
discussion of the risks, benefits and alternatives to treatment. A
timeout was performed prior to the initiation of the procedure.

Initial ultrasound scanning demonstrates a large amount of ascites
within the right lower abdominal quadrant. The right lower abdomen
was prepped and draped in the usual sterile fashion. 1% lidocaine
was used for local anesthesia.

Following this, a 6 Fr Safe-T-Centesis catheter was introduced. An
ultrasound image was saved for documentation purposes. The
paracentesis was performed. The catheter was removed and a dressing
was applied. The patient tolerated the procedure well without
immediate post procedural complication.
FINDINGS: A total of approximately 5.8 L of yellow fluid was removed.
IMPRESSION: Successful ultrasound-guided paracentesis yielding 5.8 liters of
peritoneal fluid.

## 2020-03-13 IMAGING — US US PARACENTESIS
1 series · 7 of 7 positions shown · non-contrast
Comparison: Multiple previous ultrasound-guided paracenteses, most
recently on 07/24/2017;

INDICATION: History of versus erosive with recurrent symptomatic abdominal
ascites. Please perform ultrasound-guided paracentesis for
diagnostic and therapeutic purposes.

EXAM:
ULTRASOUND-GUIDED PARACENTESIS
TECHNIQUE: Informed written consent was obtained from the patient after a
discussion of the risks, benefits and alternatives to treatment. A
timeout was performed prior to the initiation of the procedure.

[Series 1: us paracentesis · 7 of 7 slices shown]
[im 1/7]
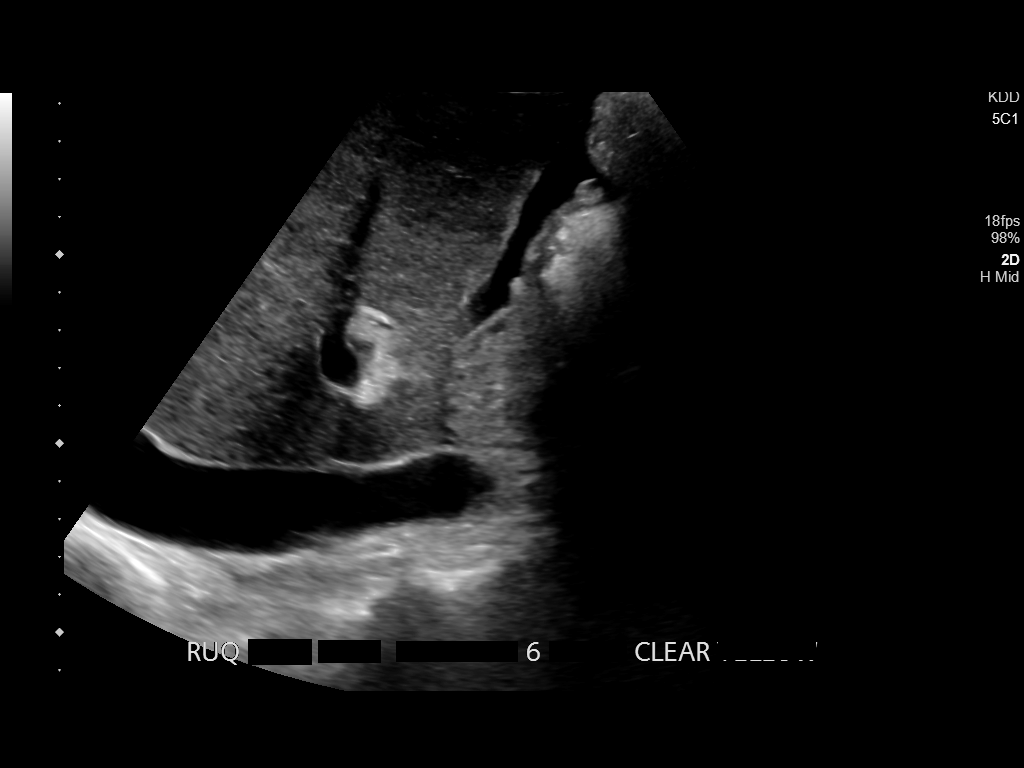
[im 2/7]
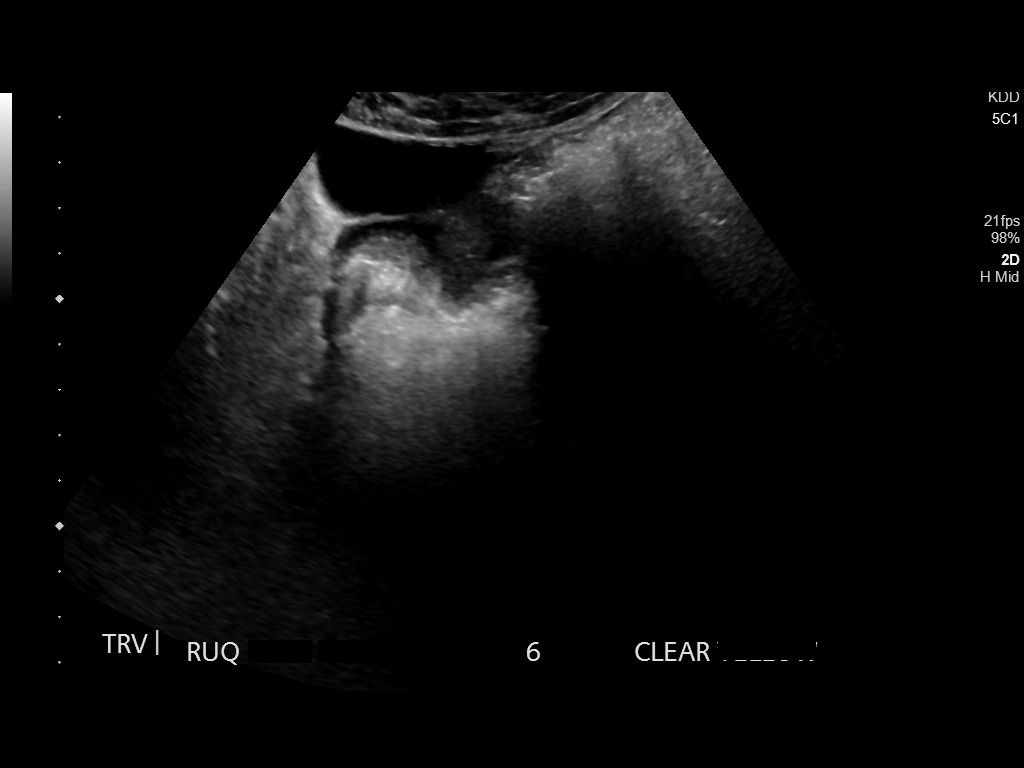
[im 3/7]
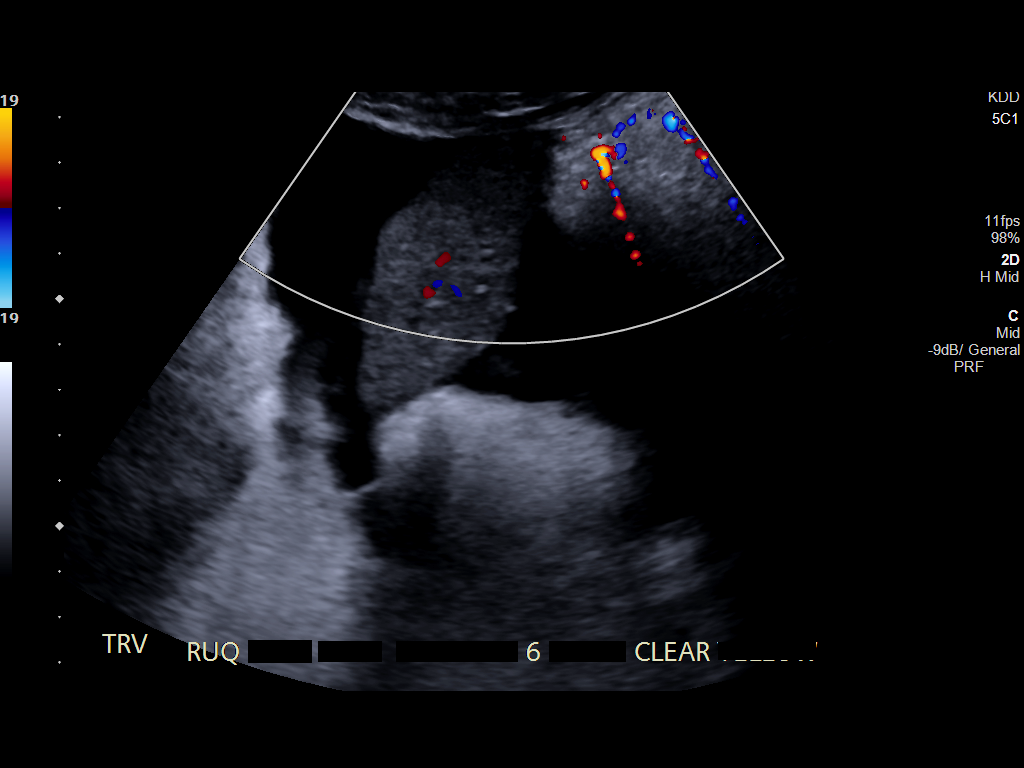
[im 4/7]
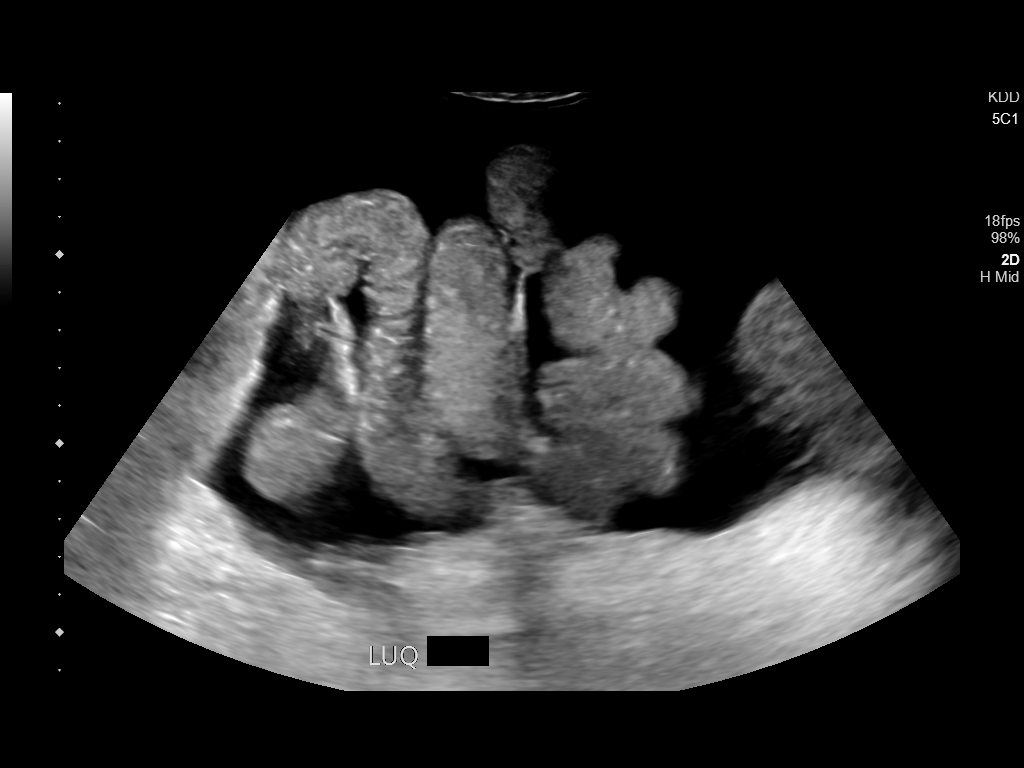
[im 5/7]
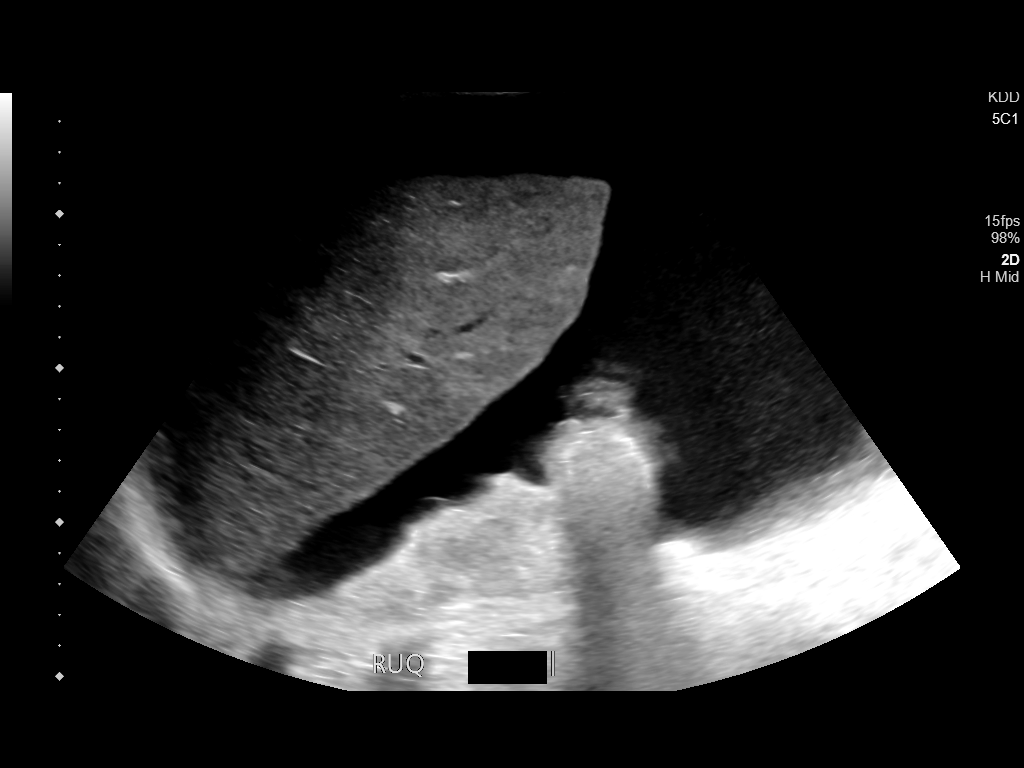
[im 6/7]
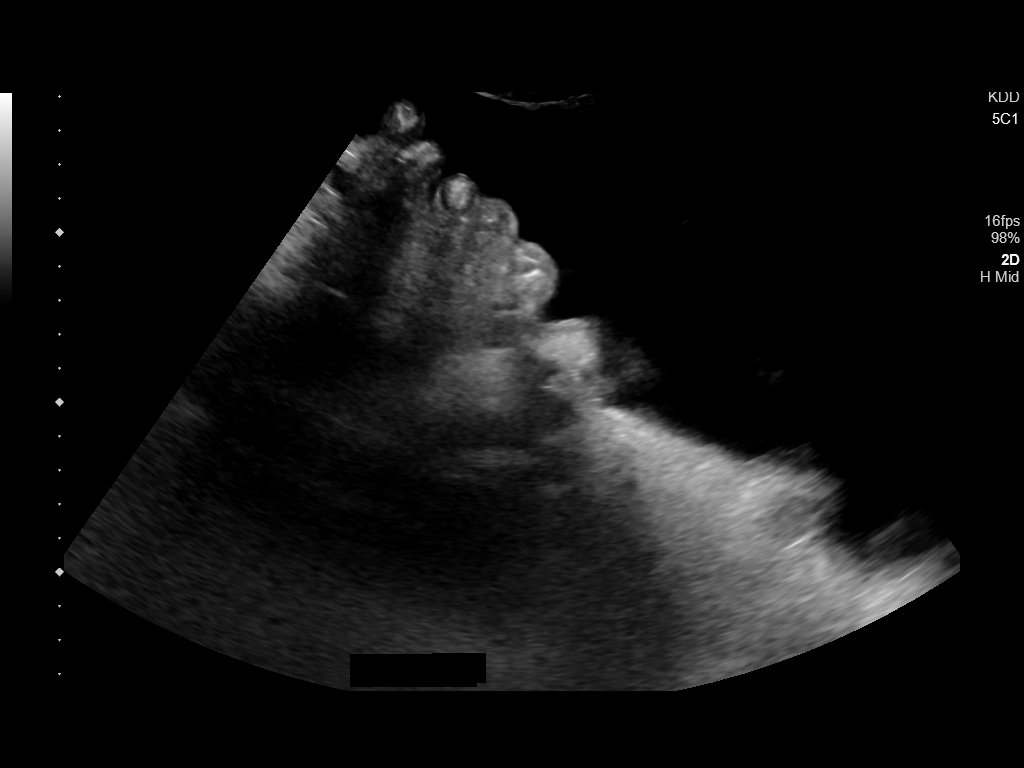
[im 7/7]
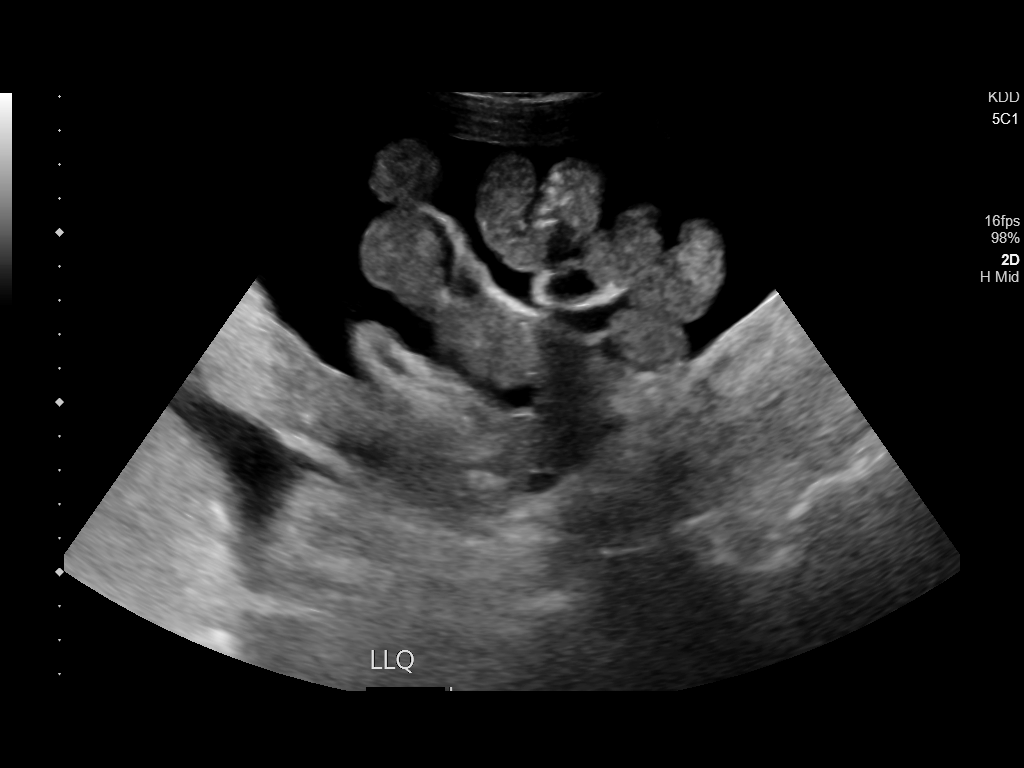

[7 of 7 positions shown; findings below may reference images not displayed]

CT abdomen and pelvis - 01/12/2017

MEDICATIONS:
None.

COMPLICATIONS:
None immediate.
Initial ultrasound scanning demonstrates a large amount of ascites
within the right lower abdominal quadrant. The right lower abdomen
was prepped and draped in the usual sterile fashion. 1% lidocaine
with epinephrine was used for local anesthesia. An ultrasound image
was saved for documentation purposed. An 8 Fr Safe-T-Centesis
catheter was introduced. The paracentesis was performed. The
catheter was removed and a dressing was applied. The patient
tolerated the procedure well without immediate post procedural
complication.
FINDINGS: A total of approximately 6 liters of serous fluid was removed.
Samples were sent to the laboratory as requested by the clinical
team.
IMPRESSION: Successful ultrasound-guided paracentesis yielding 6 liters of
peritoneal fluid.

## 2020-03-21 IMAGING — CR DG CHEST 1V PORT
1 series · 1 of 1 positions shown · non-contrast
Comparison: 04/26/2018

CLINICAL DATA: Hypoxia

EXAM:
PORTABLE CHEST 1 VIEW

[chest ap]
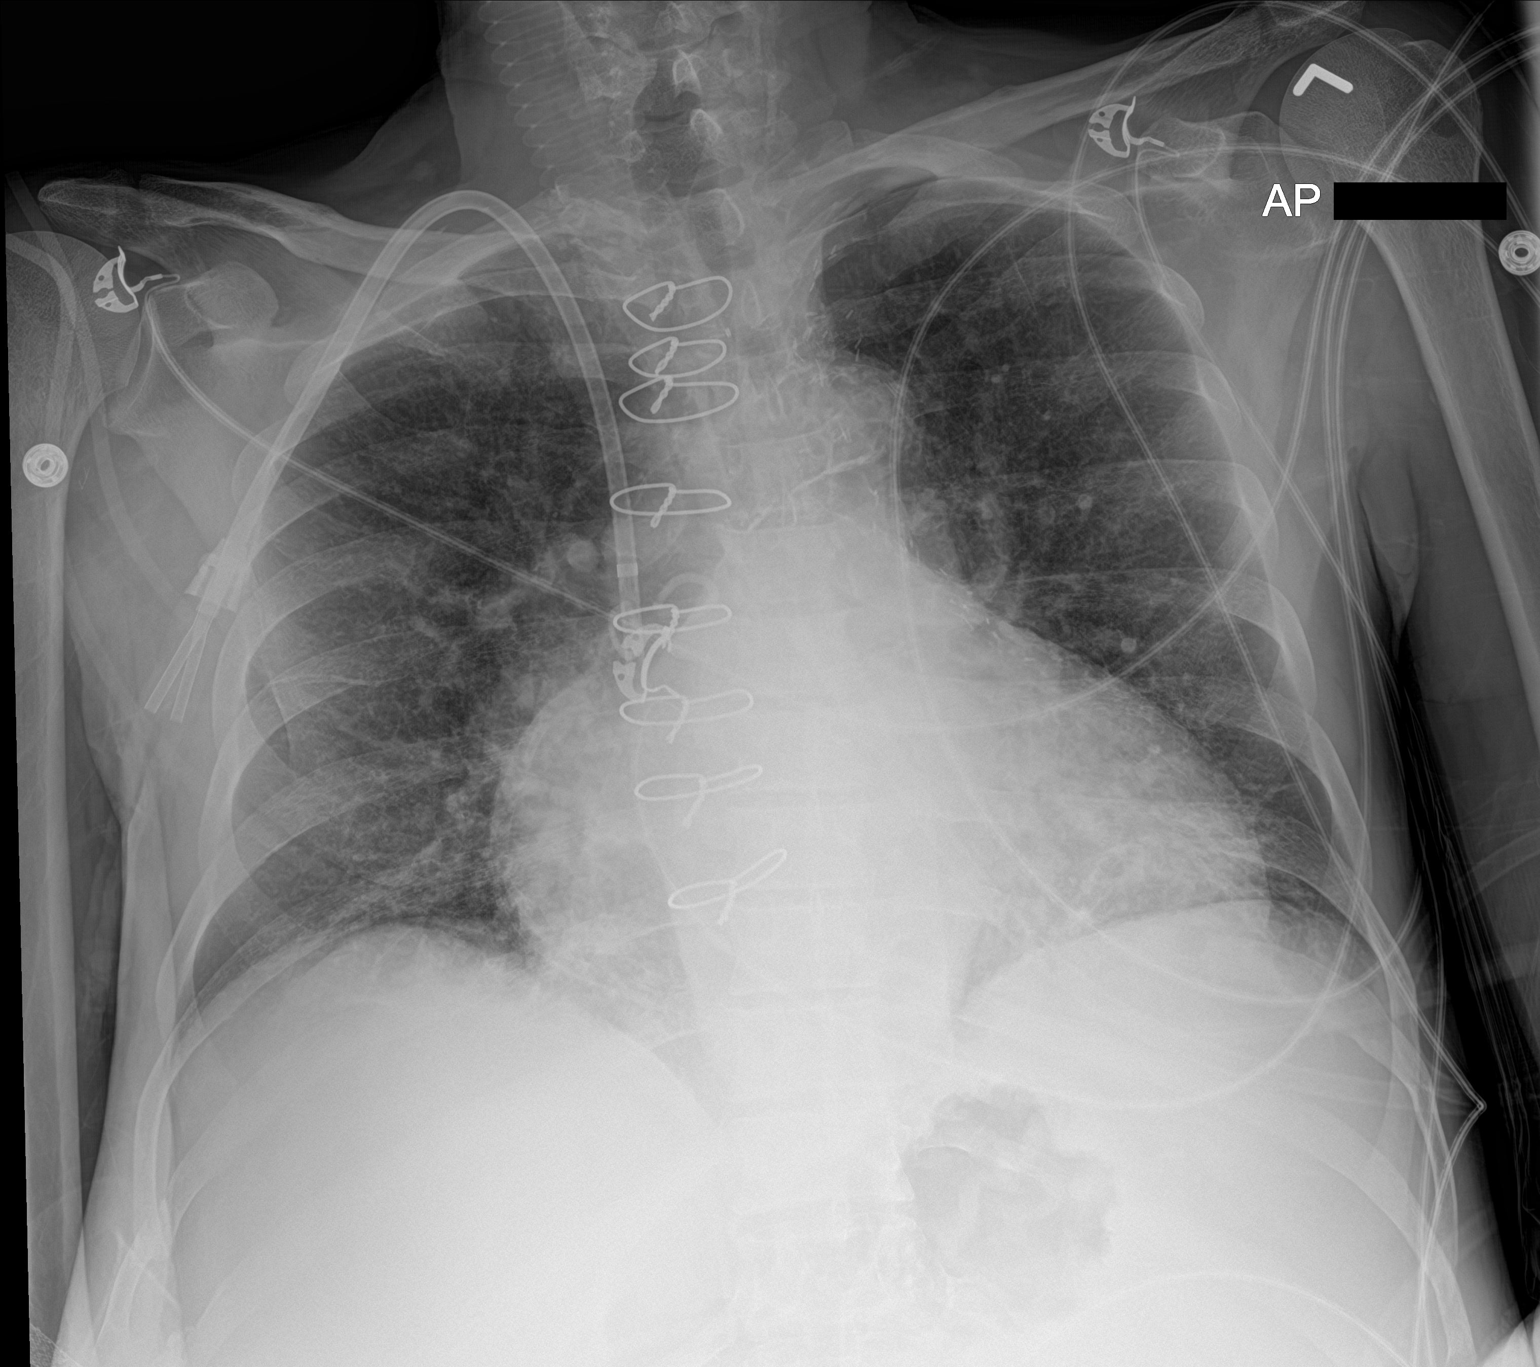

[1 of 1 positions shown; findings below may reference images not displayed]

FINDINGS: Right dialysis catheter remains in place, unchanged. Prior CABG.
Cardiomegaly with vascular congestion and interstitial prominence,
likely interstitial edema. Bibasilar atelectasis. No effusions or
acute bony abnormality.
IMPRESSION: Cardiomegaly with mild interstitial edema.  Bibasilar atelectasis.

## 2020-03-21 IMAGING — CT CT HEAD W/O CM
3 of 6 series · 15 of 47 positions shown, 18 images · non-contrast
Comparison: 01/15/2018

CLINICAL DATA: Altered mental status

EXAM:
CT HEAD WITHOUT CONTRAST
TECHNIQUE: Contiguous axial images were obtained from the base of the skull
through the vertex without intravenous contrast.

[Series 3: head wo · axial · 0.42mm/px · z∈[+598,+728]mm · 10 of 30 slices shown, 13 images]
[im 2/30  brain]
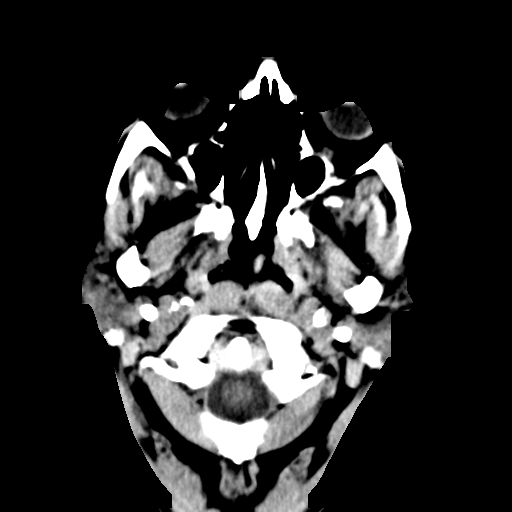
[im 2/30  bone]
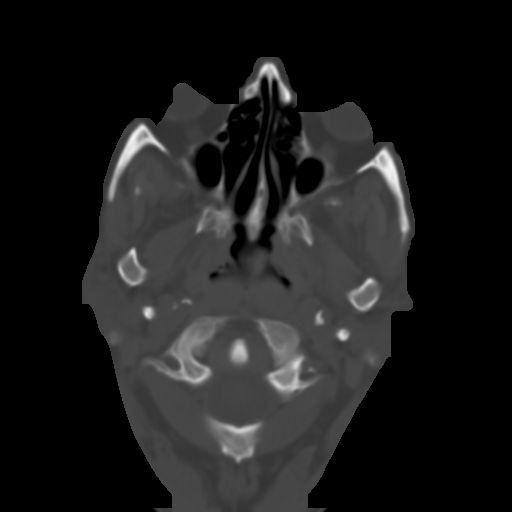
[im 6/30  brain]
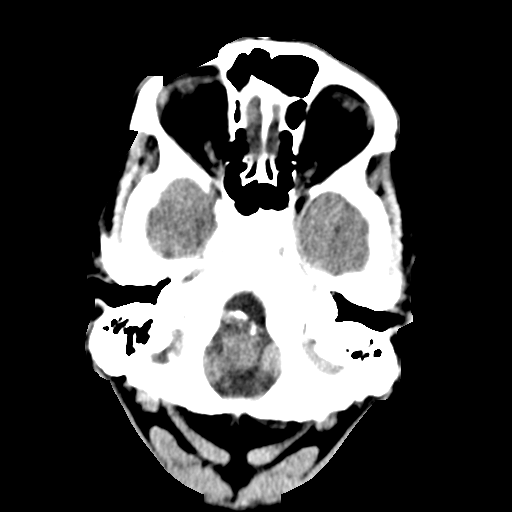
[im 9/30  brain]
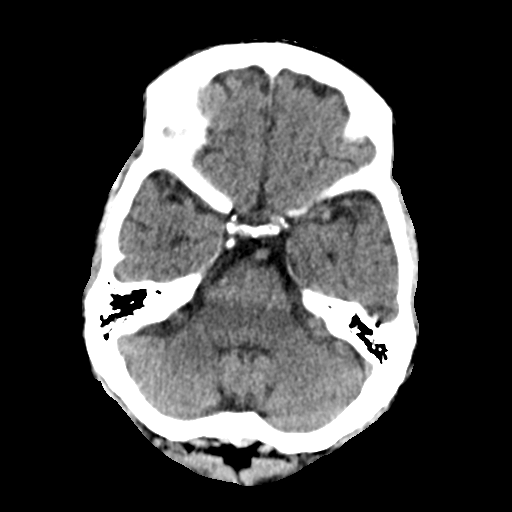
[im 11/30  brain]
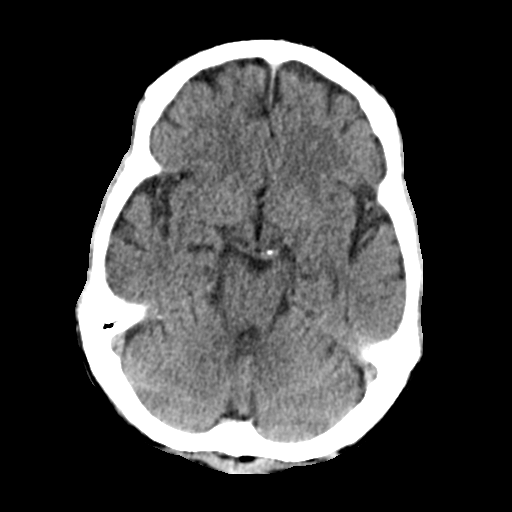
[im 14/30  brain]
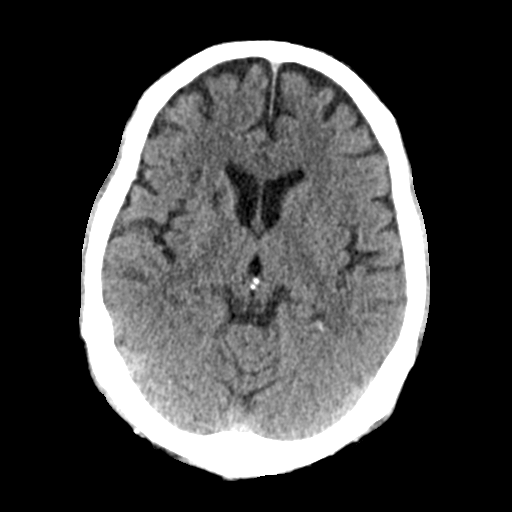
[im 14/30  bone]
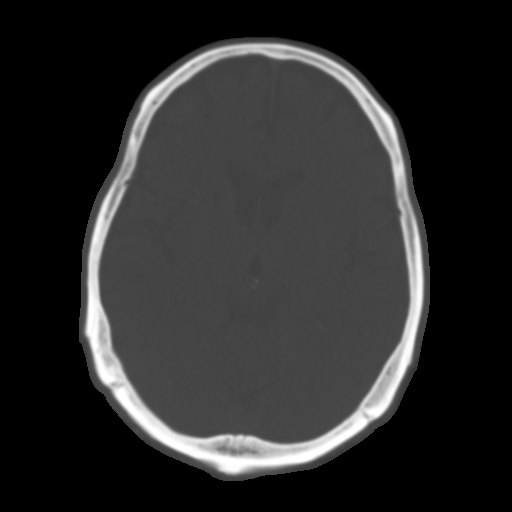
[im 16/30  brain]
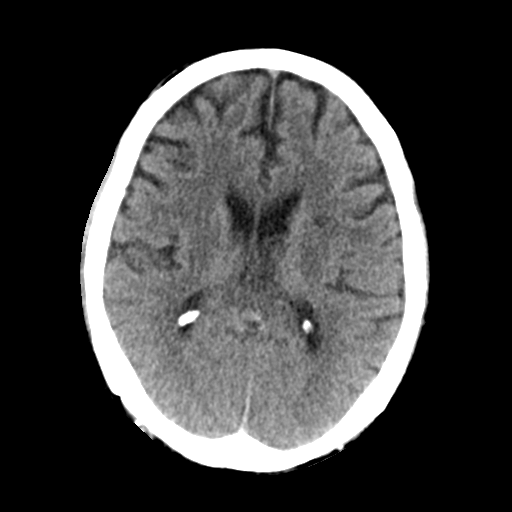
[im 19/30  brain]
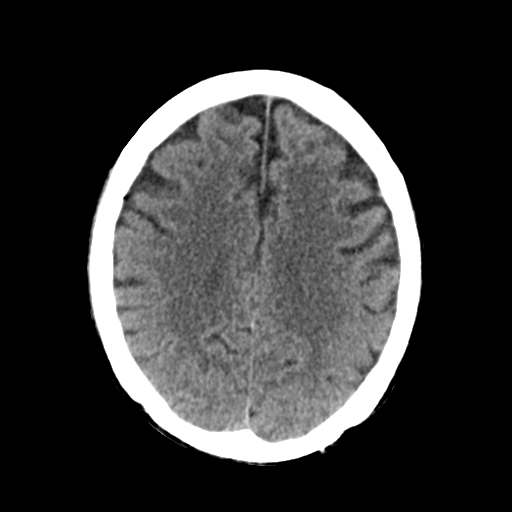
[im 23/30  brain]
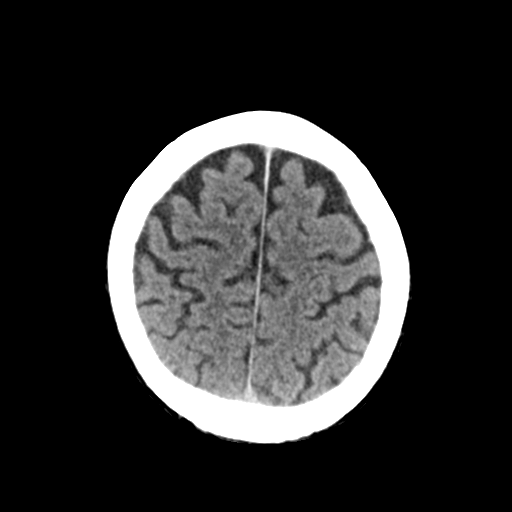
[im 24/30  brain]
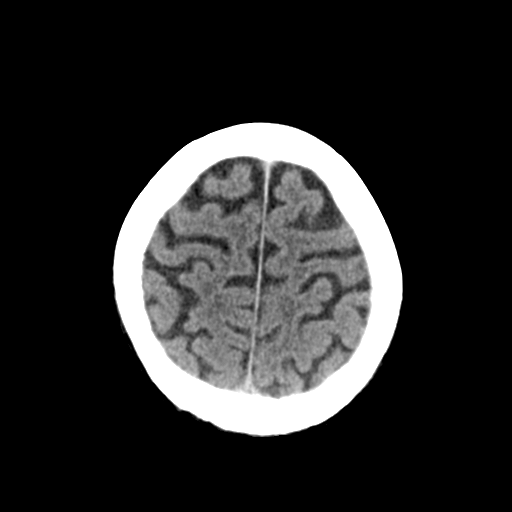
[im 24/30  bone]
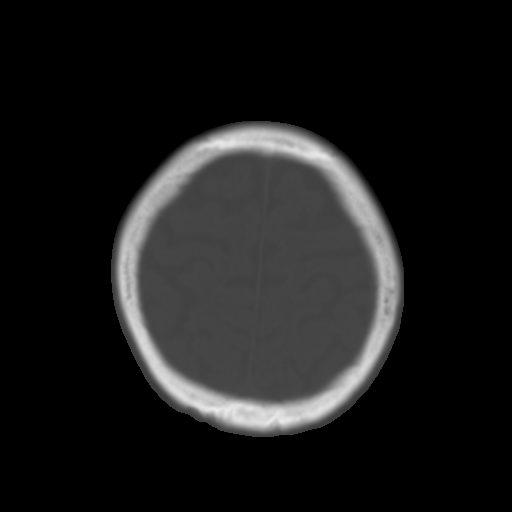
[im 28/30  brain]
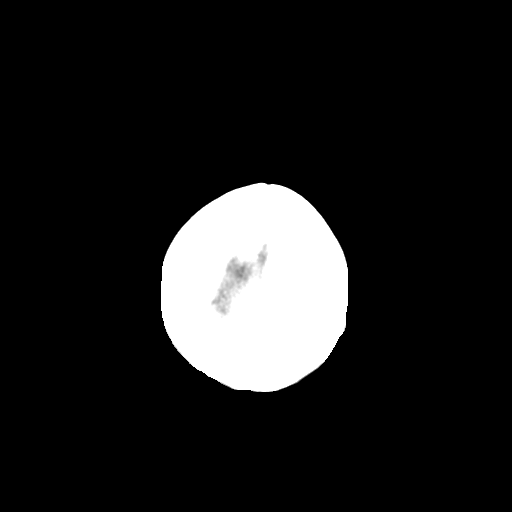

[Series 5: sagittal soft tissue · sagittal · 0.33mm/px · 2 of 51 slices shown]
[im 17/51  brain]
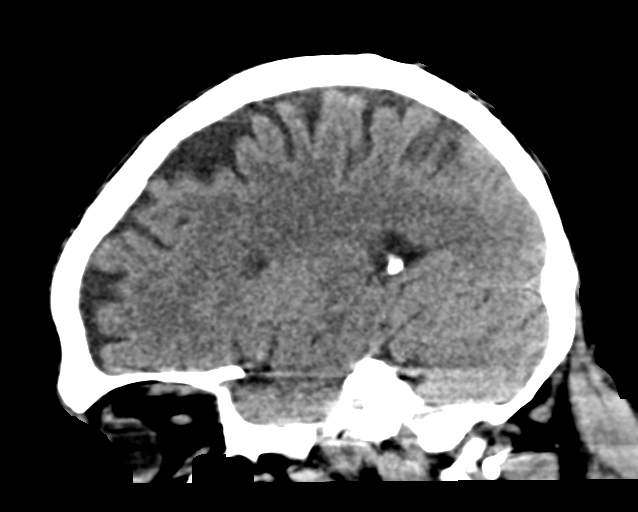
[im 34/51  brain]
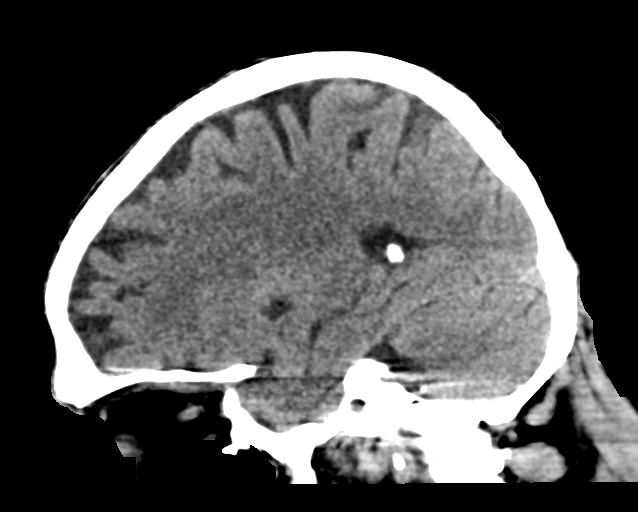

[Series 8: coronal soft tissue · coronal · 0.20mm/px · 3 of 58 slices shown]
[im 15/58  brain]
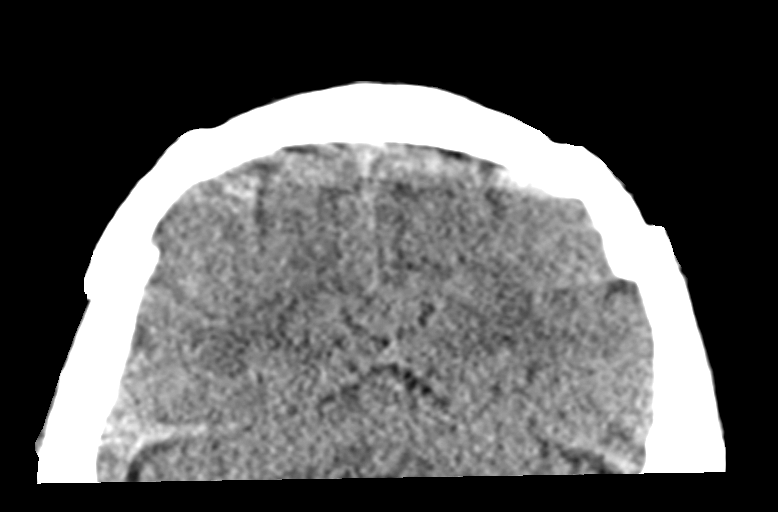
[im 29/58  brain]
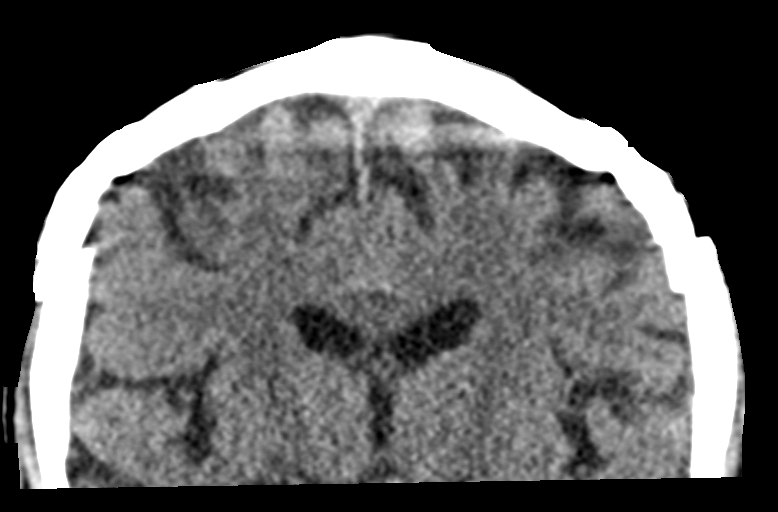
[im 43/58  brain]
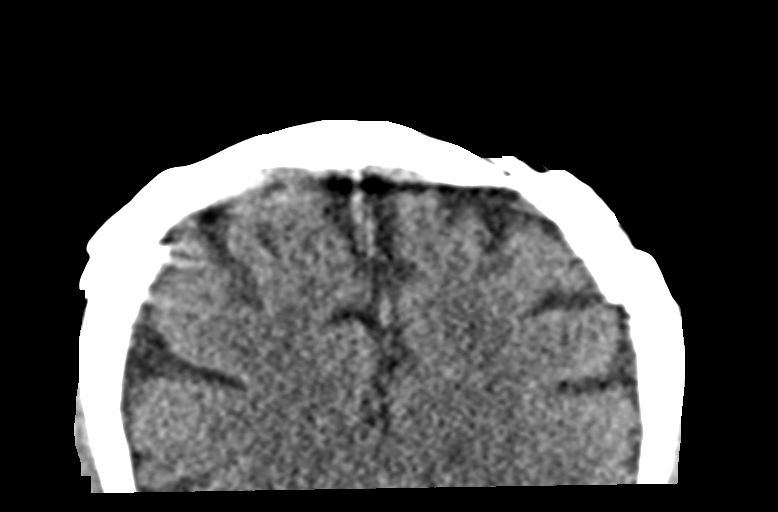

[15 of 47 positions shown; findings below may reference images not displayed]

FINDINGS: Brain: Minimal atrophic changes are noted. Scattered lacunar
infarcts are noted stable from the previous exam. No findings to
suggest acute hemorrhage, acute infarction or space-occupying mass
lesion are seen.

Vascular: No hyperdense vessel or unexpected calcification.

Skull: Normal. Negative for fracture or focal lesion.

Sinuses/Orbits: No acute finding.

Other: None.
IMPRESSION: Chronic changes without acute abnormality

## 2020-03-21 IMAGING — DX DG CHEST 1V PORT
1 series · 1 of 1 positions shown · non-contrast
Comparison: 04/09/2018

CLINICAL DATA: Hematemesis.  Abdominal distension.  Ascites.

EXAM:
PORTABLE CHEST 1 VIEW

[chest ap]
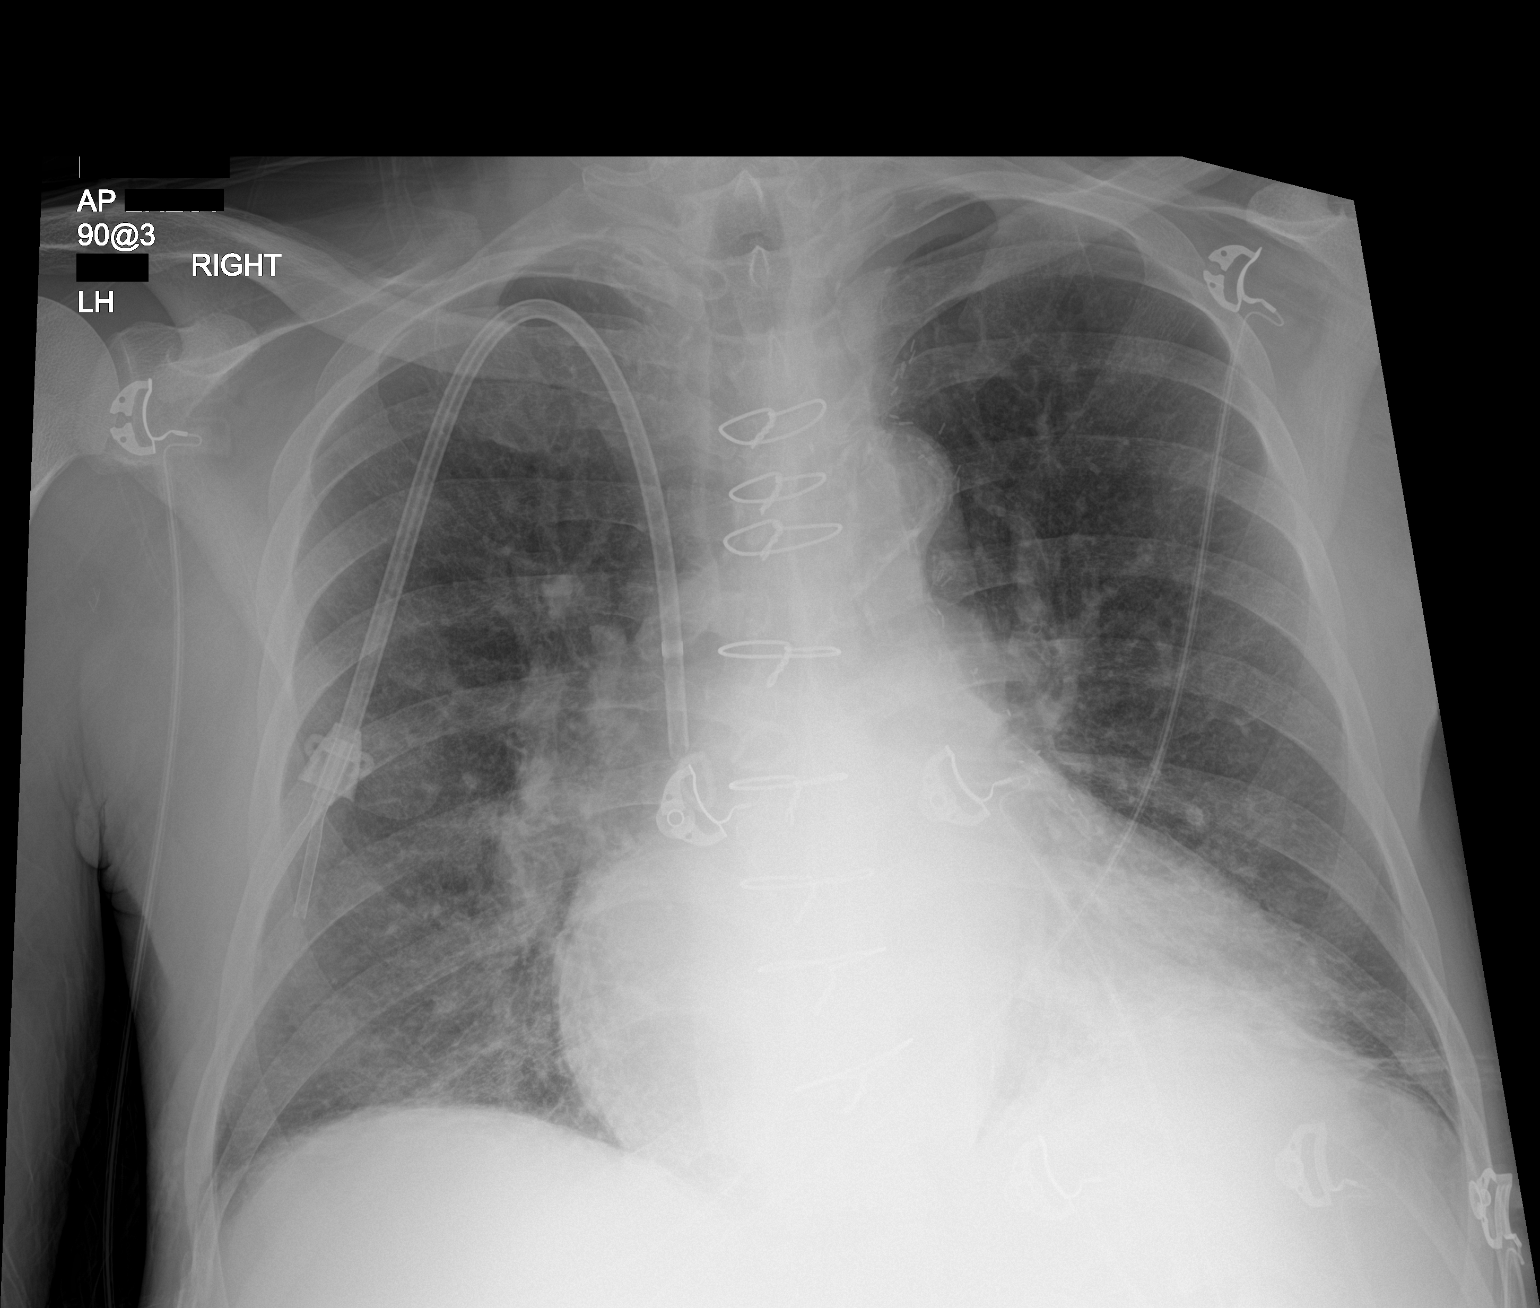

[1 of 1 positions shown; findings below may reference images not displayed]

FINDINGS: Previous median sternotomy. Cardiomegaly. Aortic atherosclerosis.
Right internal jugular central line tip in the SVC 3 cm above the
right atrium. Pulmonary venous hypertension with mild interstitial
edema. Mild atelectasis at the left lung base. No lobar collapse. No
visible effusion.
IMPRESSION: Mild fluid overload/early interstitial edema. Mild atelectasis at
the left base.

## 2020-03-22 IMAGING — US US PARACENTESIS
1 series · 3 of 3 positions shown · non-contrast
Comparison: none

INDICATION: Patient with abdominal distention, ascites. Request is made for
therapeutic paracentesis.

[Series 1: us paracentesis · 3 of 3 slices shown]
[im 1/3]
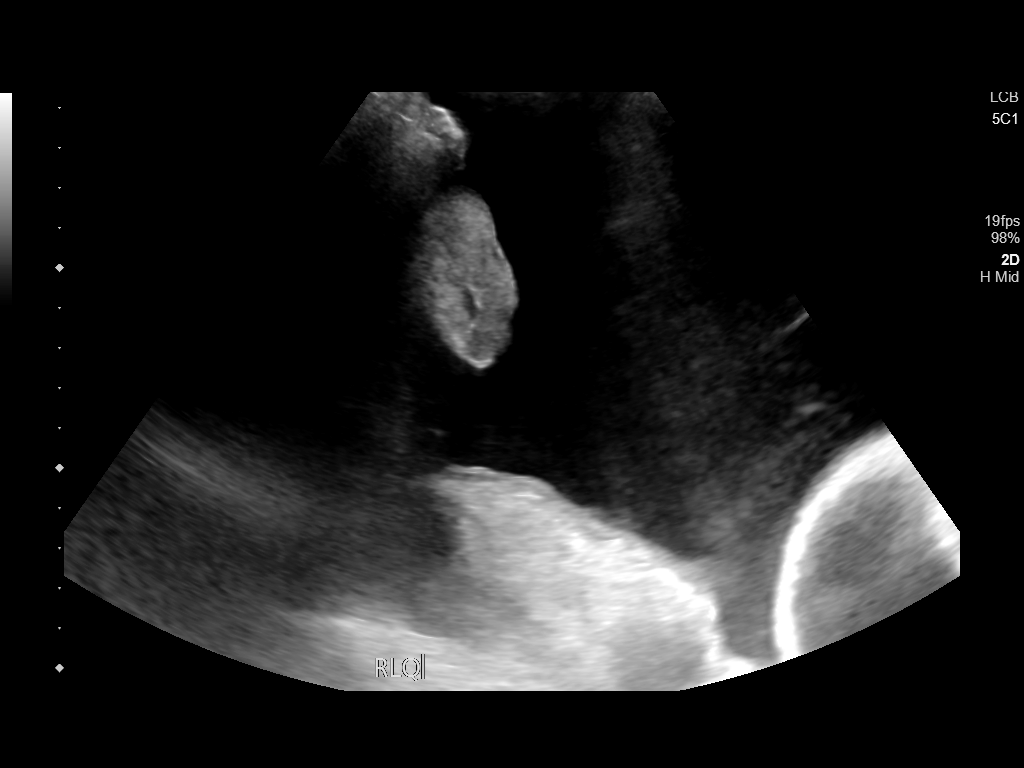
[im 2/3]
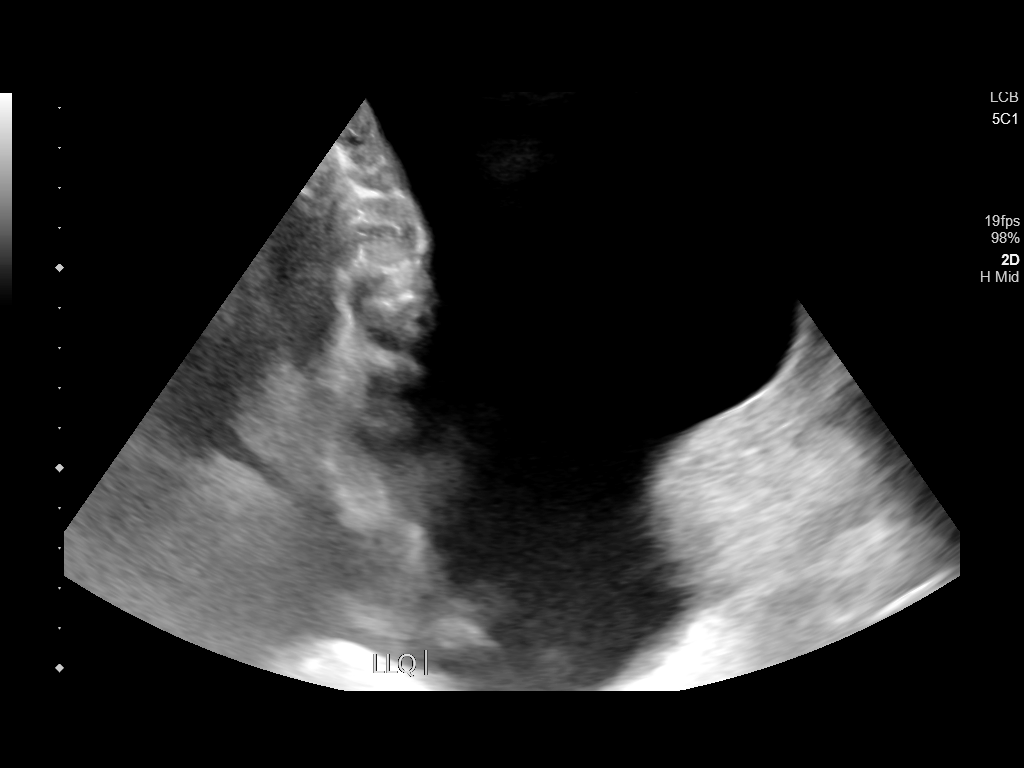
[im 3/3]
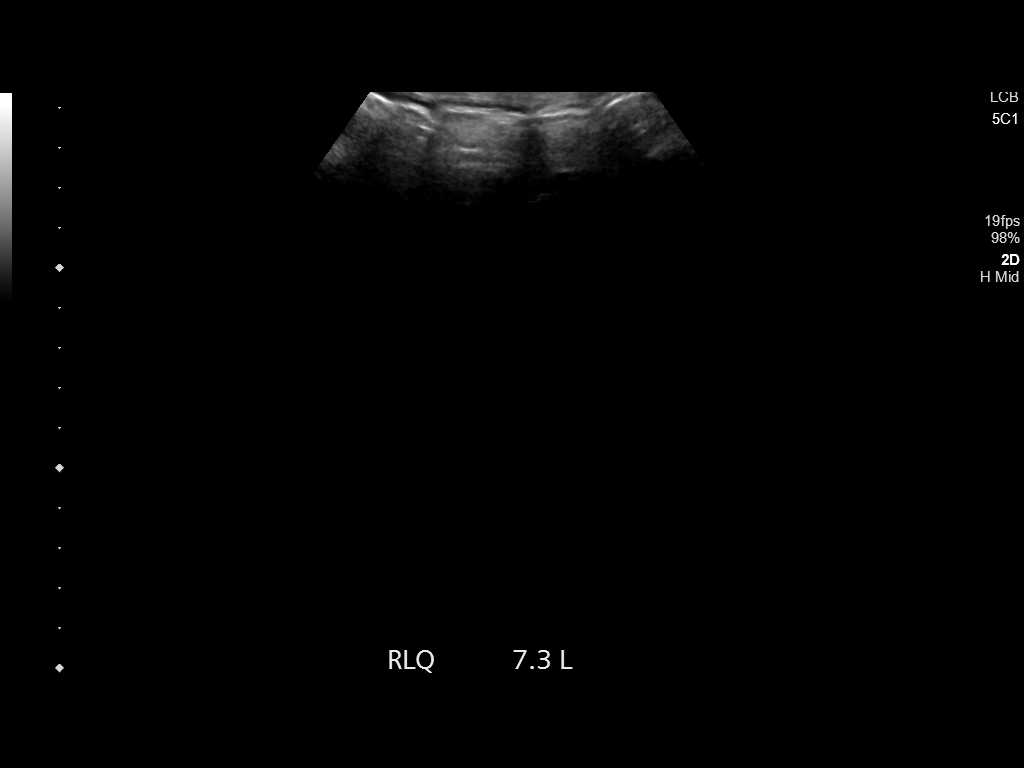

[3 of 3 positions shown; findings below may reference images not displayed]

EXAM:
ULTRASOUND GUIDED THERAPEUTIC PARACENTESIS

MEDICATIONS:
10 mL 1% lidocaine

COMPLICATIONS:
None immediate.

PROCEDURE:
Informed written consent was obtained from the patient after a
discussion of the risks, benefits and alternatives to treatment. A
timeout was performed prior to the initiation of the procedure.

Initial ultrasound scanning demonstrates a large amount of ascites
within the right lower abdominal quadrant. The right lower abdomen
was prepped and draped in the usual sterile fashion. 1% lidocaine
was used for local anesthesia.

Following this, a Safe-T-Centesis catheter was introduced. An
ultrasound image was saved for documentation purposes. The
paracentesis was performed. The catheter was removed and a dressing
was applied. The patient tolerated the procedure well without
immediate post procedural complication.
FINDINGS: A total of approximately 7.3 liters of dark, yellow fluid was
removed.
IMPRESSION: Successful ultrasound-guided therapeutic paracentesis yielding
liters of peritoneal fluid.

## 2020-03-26 IMAGING — US US PARACENTESIS
1 series · 9 of 9 positions shown · non-contrast
Comparison: none

INDICATION: Recurrent ascites, abdominal distension

[Series 1: us paracentesis · 9 of 9 slices shown]
[im 1/9]
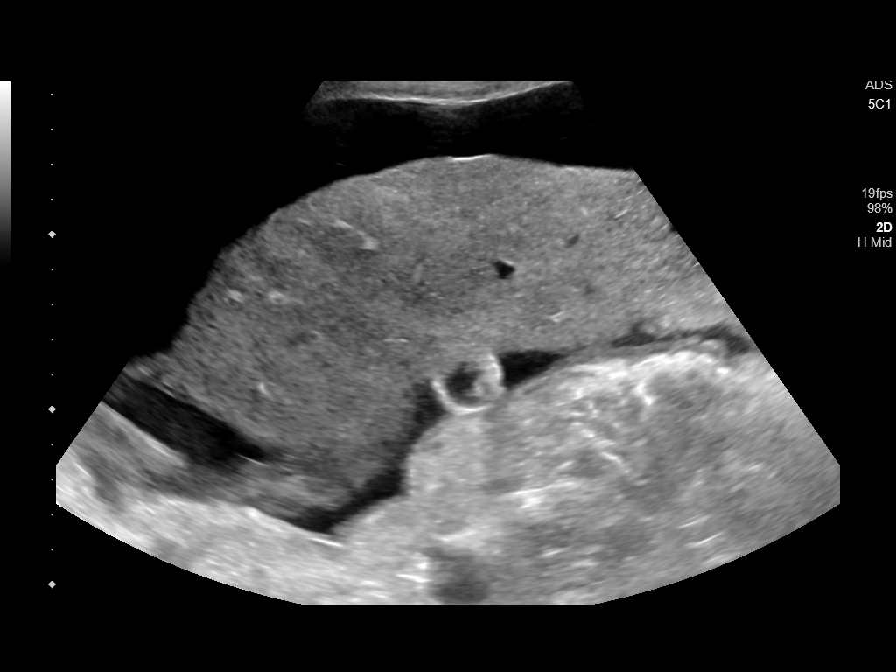
[im 2/9]
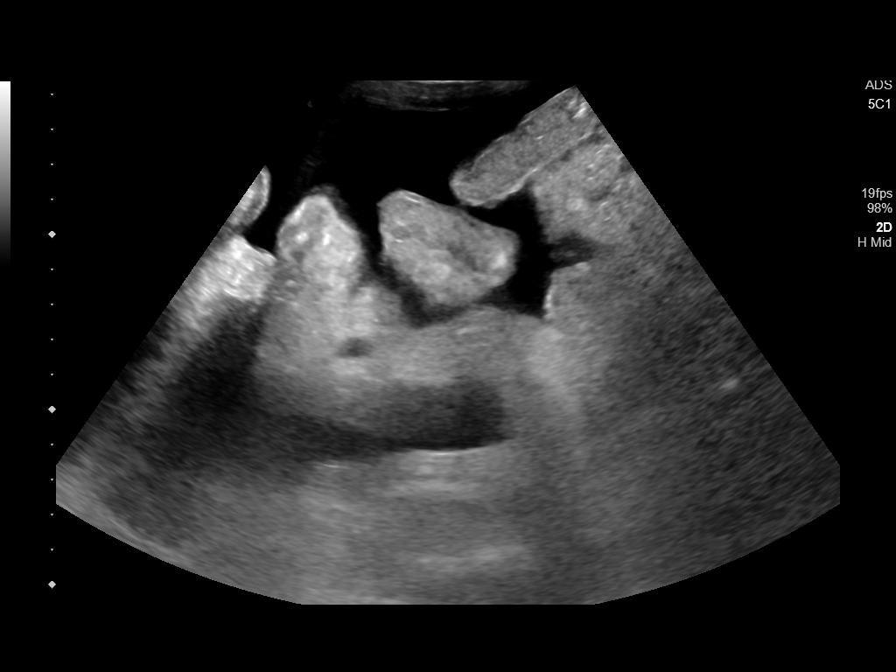
[im 3/9]
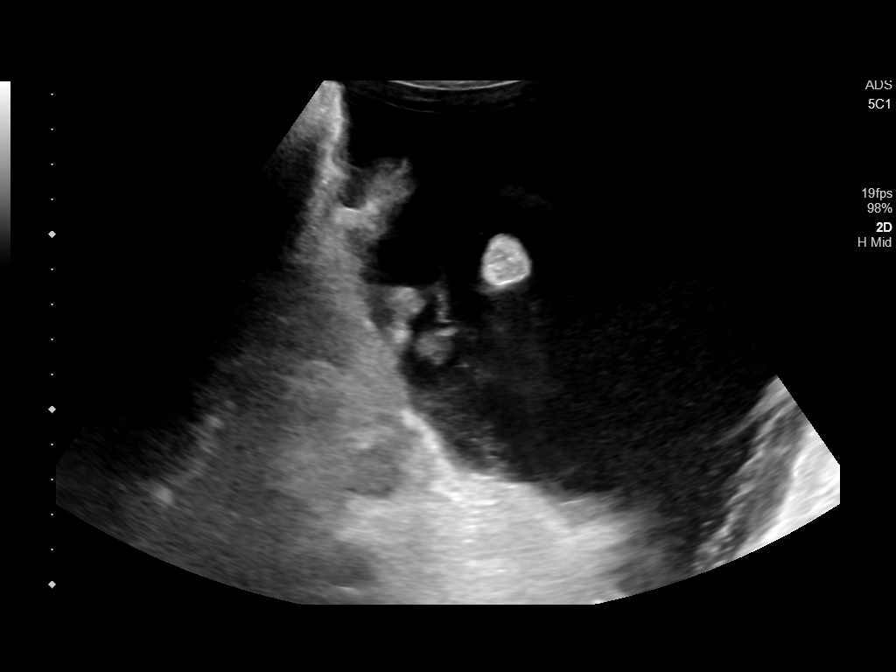
[im 4/9]
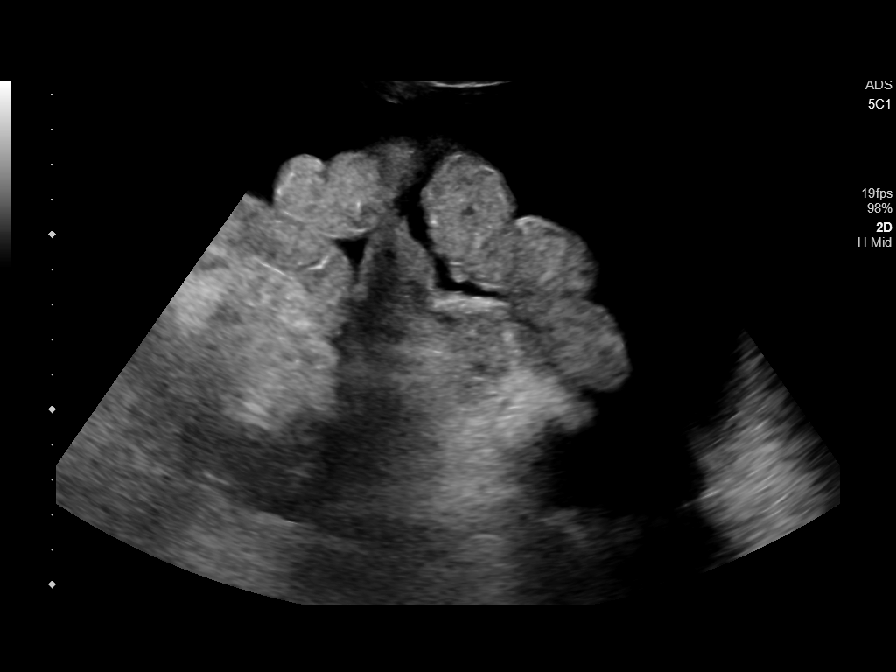
[im 5/9]
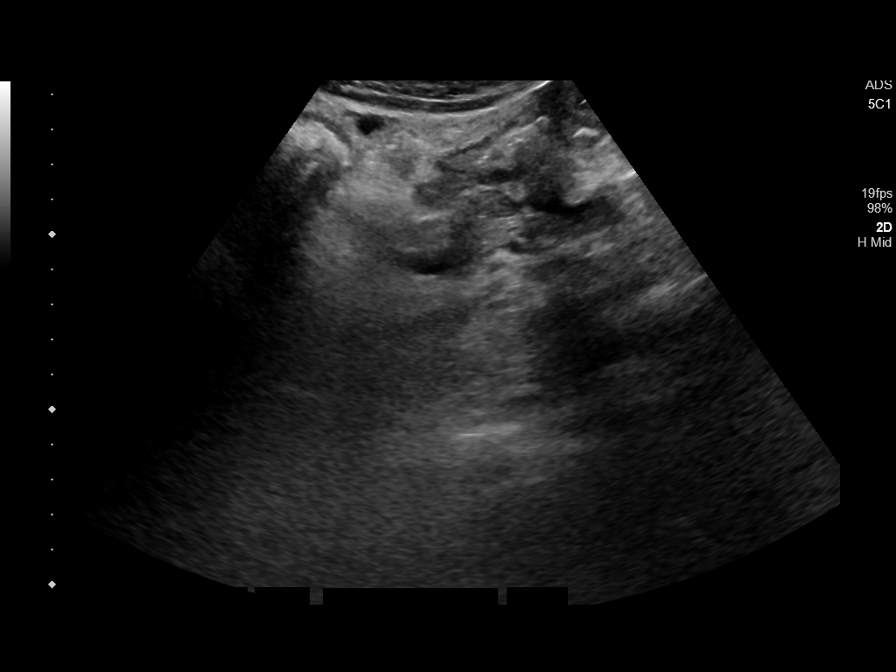
[im 6/9]
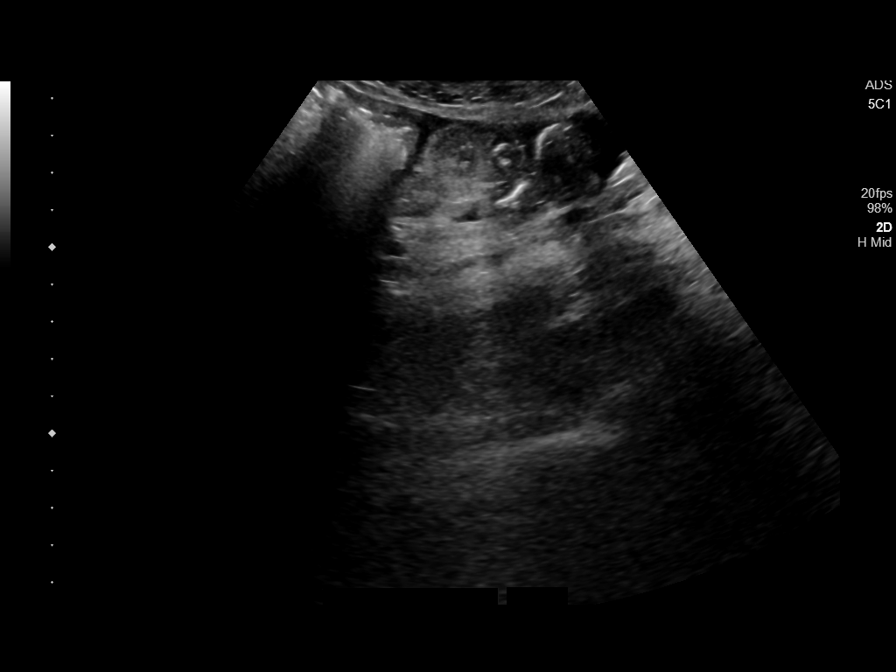
[im 7/9]
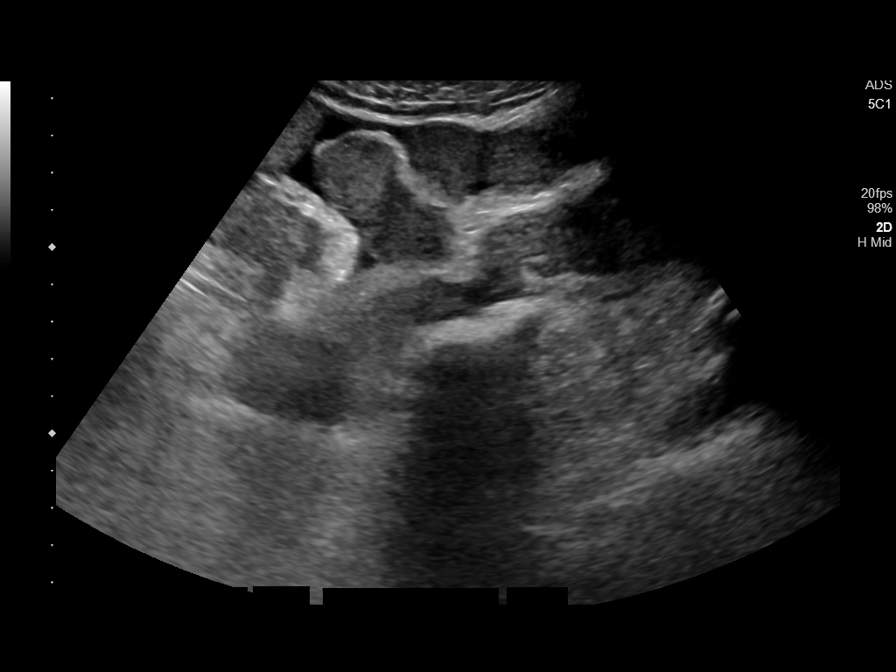
[im 8/9]
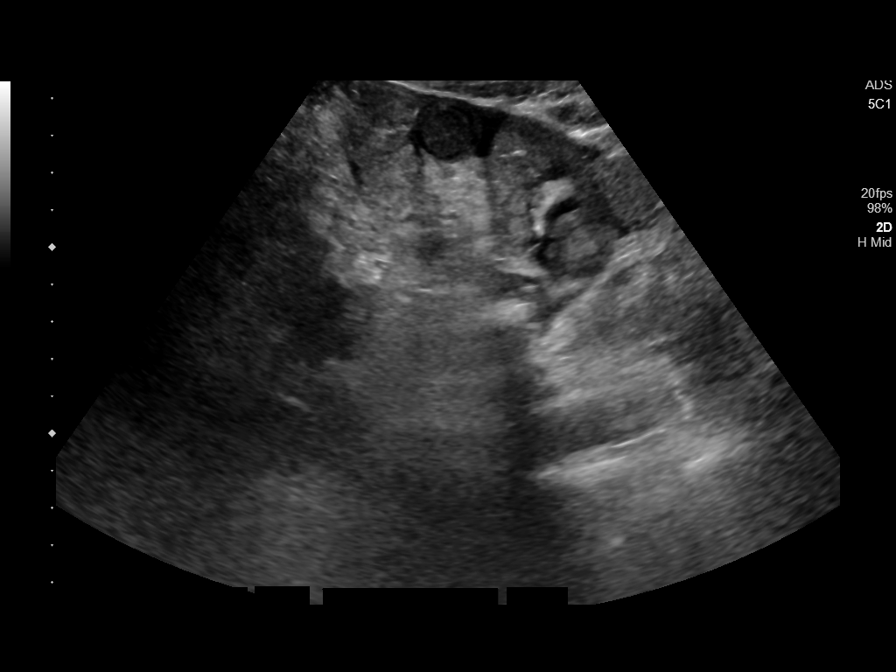
[im 9/9]
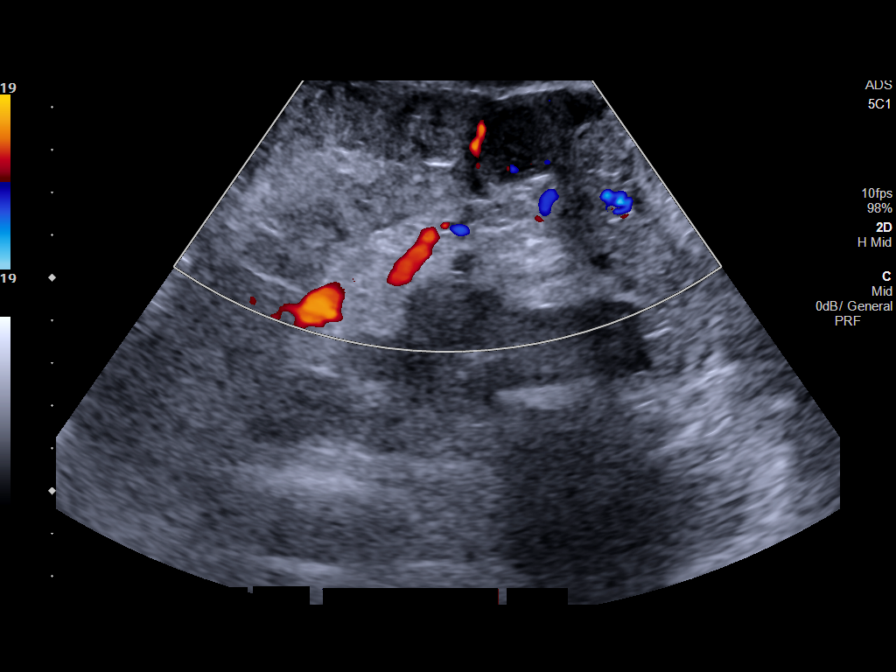

[9 of 9 positions shown; findings below may reference images not displayed]

EXAM:
ULTRASOUND GUIDED LEFT PARACENTESIS

MEDICATIONS:
1% lidocaine local

COMPLICATIONS:
None immediate.

PROCEDURE:
Informed written consent was obtained from the patient after a
discussion of the risks, benefits and alternatives to treatment. A
timeout was performed prior to the initiation of the procedure.

Initial ultrasound scanning demonstrates a large amount of ascites
within the left lower abdominal quadrant. The right lower abdomen
was prepped and draped in the usual sterile fashion. 1% lidocaine
with epinephrine was used for local anesthesia.

Following this, a 6 Fr Safe-T-Centesis catheter was introduced. An
ultrasound image was saved for documentation purposes. The
paracentesis was performed. The catheter was removed and a dressing
was applied. The patient tolerated the procedure well without
immediate post procedural complication.
FINDINGS: A total of approximately 5.2 L of clear peritoneal fluid was
removed. Sample was not sent for laboratory analysis
IMPRESSION: Successful ultrasound-guided paracentesis yielding 5.2 liters of
peritoneal fluid.

## 2020-03-28 IMAGING — DX DG CHEST 1V PORT
2 series · 2 of 2 positions shown · non-contrast
Comparison: 04/26/2018 chest radiograph.

CLINICAL DATA: 57 y/o M; rectal bleeding. Patient has not been to
dialysis for 1 week. Central chest pain with shortness of breath.

EXAM:
PORTABLE CHEST 1 VIEW

[chest ap (1 of 2)]
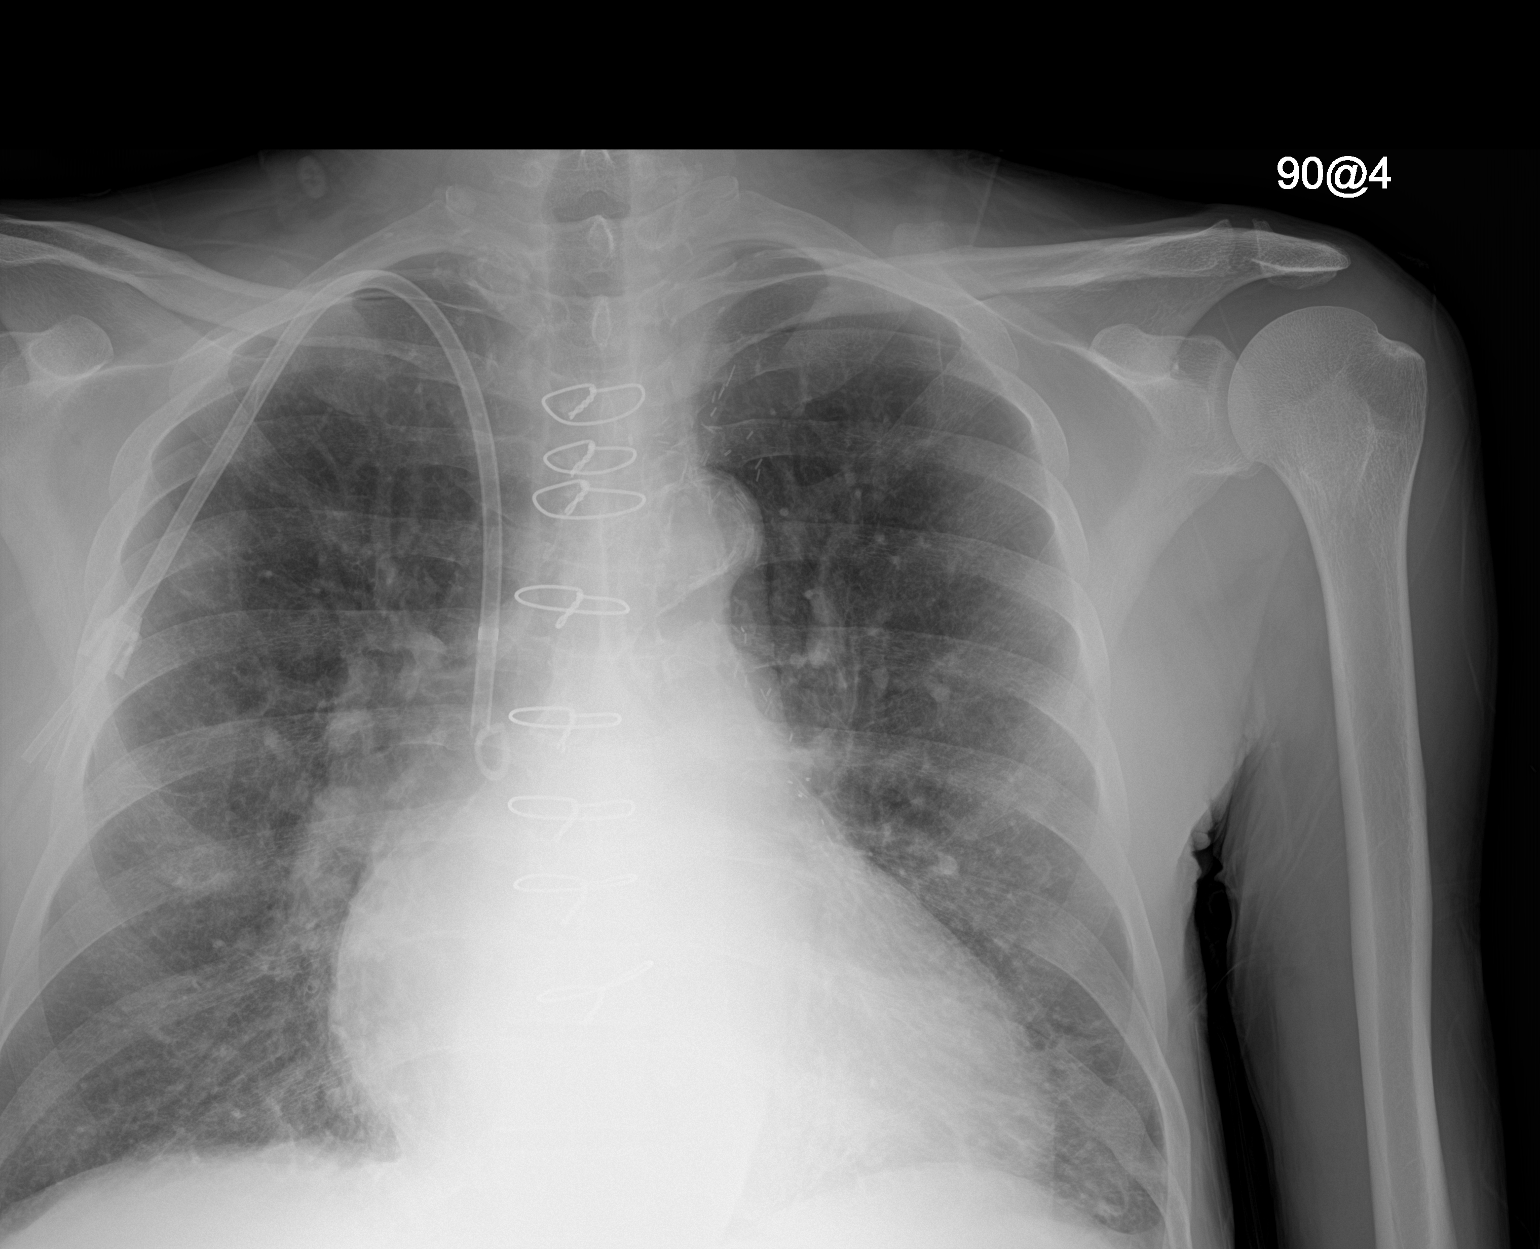

[chest ap (2 of 2)]
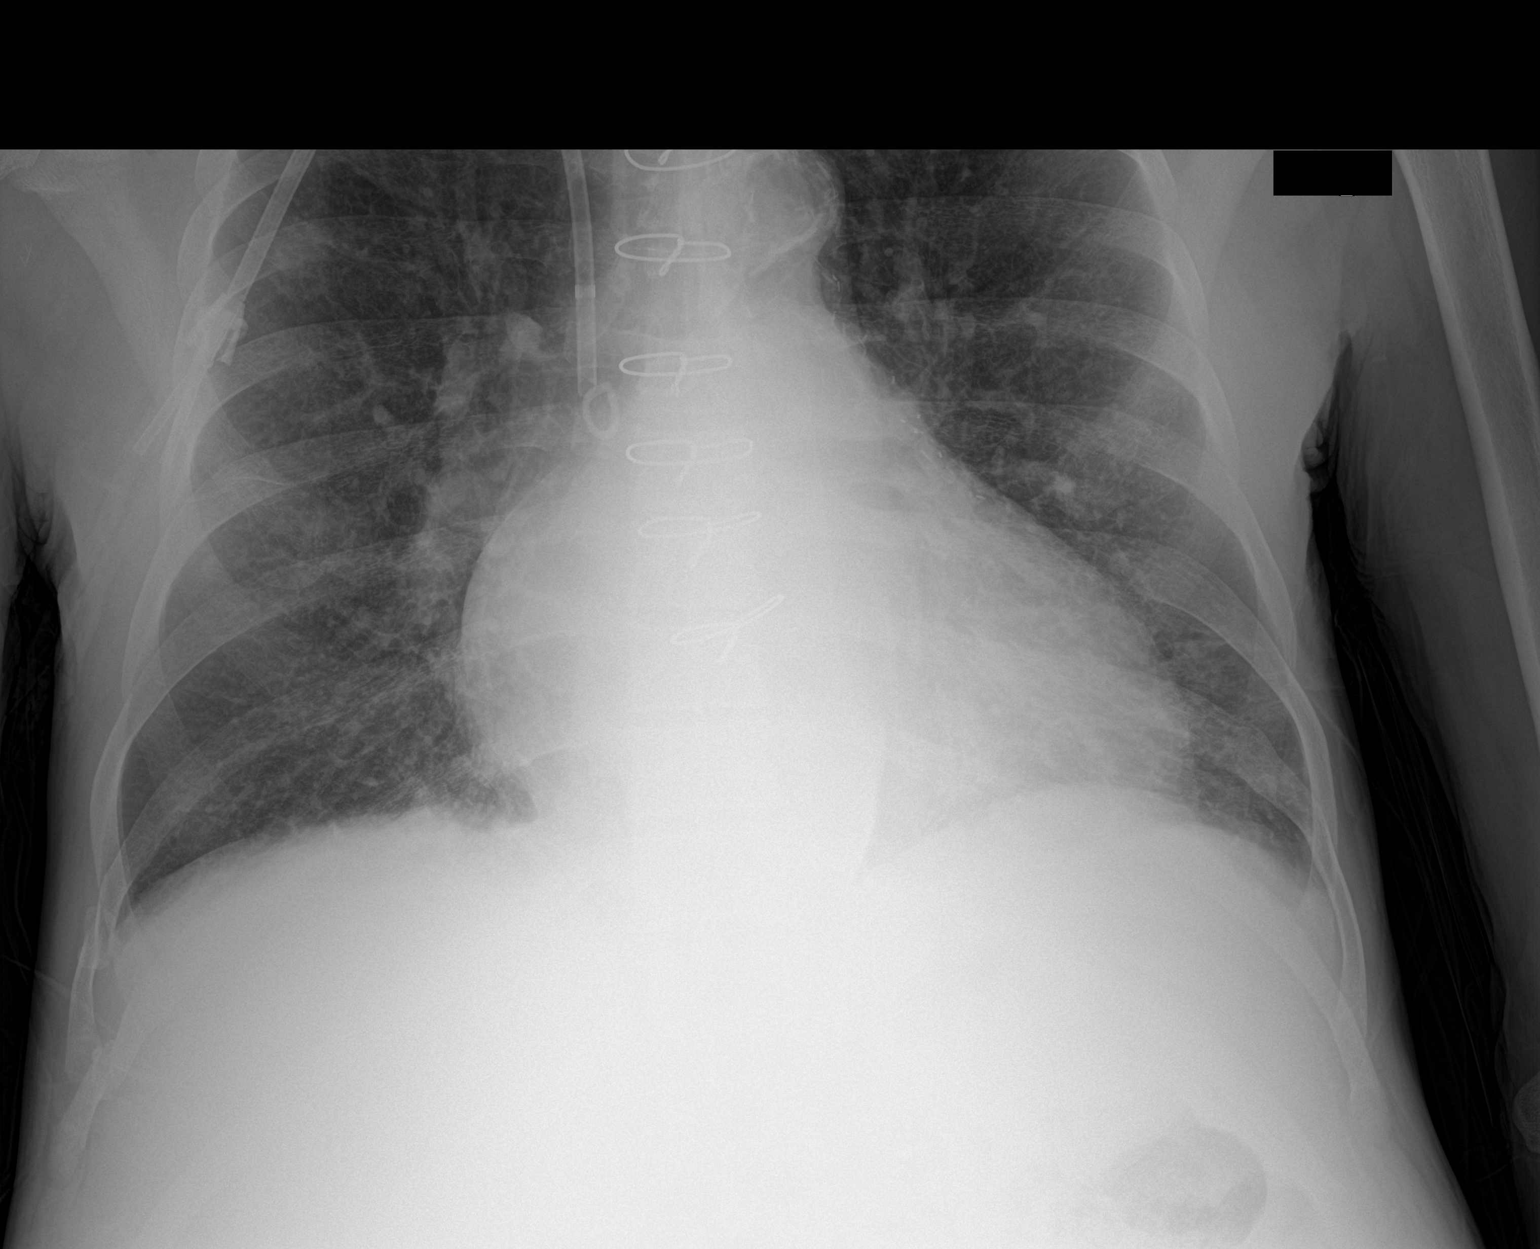

[2 of 2 positions shown; findings below may reference images not displayed]

FINDINGS: Stable cardiomegaly given projection and technique. Right central
venous catheter is stable in position projecting over lower SVC.
Aortic calcific atherosclerosis. Post median sternotomy with wires
in alignment. Stable reticular opacities with basilar predominance
compatible with interstitial pulmonary edema. No new consolidation,
effusion, or pneumothorax. No acute osseous abnormality is evident.
IMPRESSION: Stable cardiomegaly and interstitial pulmonary edema. Aortic
Atherosclerosis ([id number]).

## 2020-04-07 IMAGING — US US EXTREM UP VEIN MAPPING UNILAT
1 series · 14 of 24 positions shown · non-contrast
Comparison: None.

CLINICAL DATA: 57-year-old male with a history of renal failure

EXAM:
US EXTREM UP VEIN MAPPING

[Series 1: us extrem up vein mapping unilat · 14 of 47 slices shown]
[im 1/47]
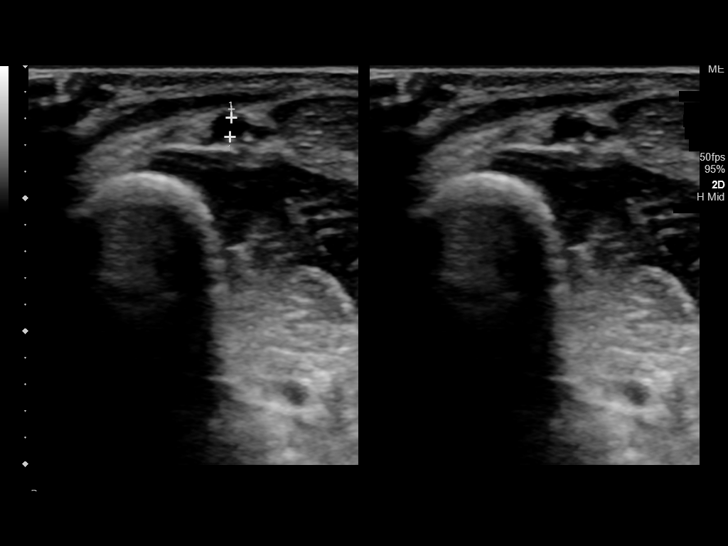
[im 5/47]
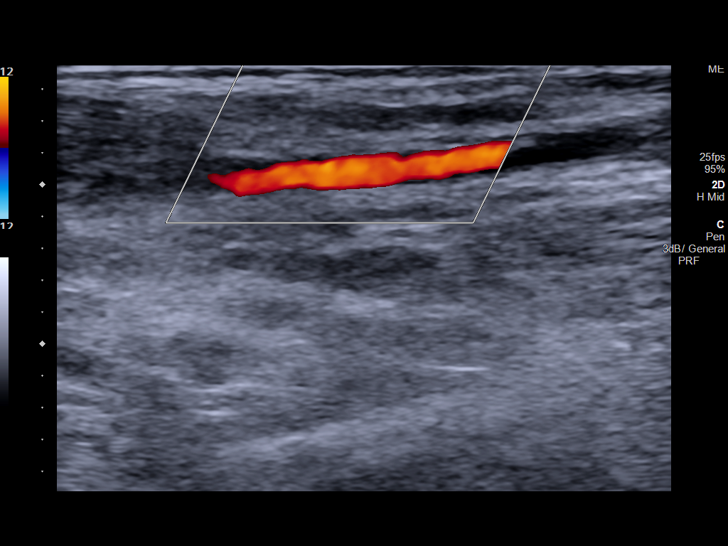
[im 9/47]
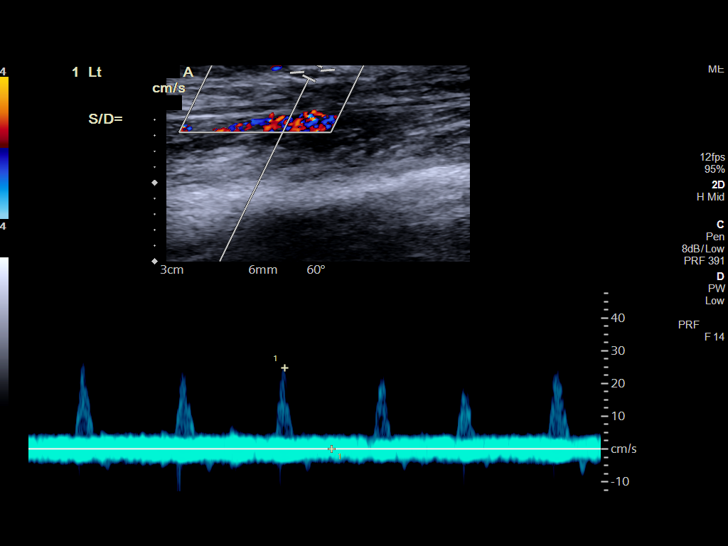
[im 13/47]
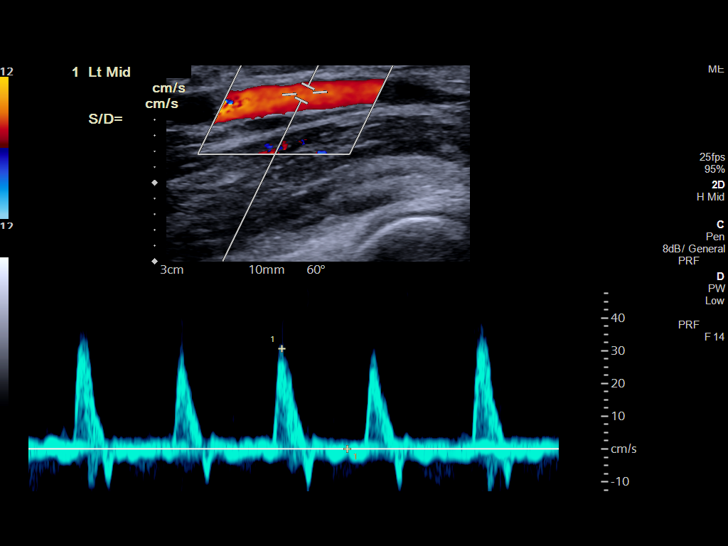
[im 15/47]
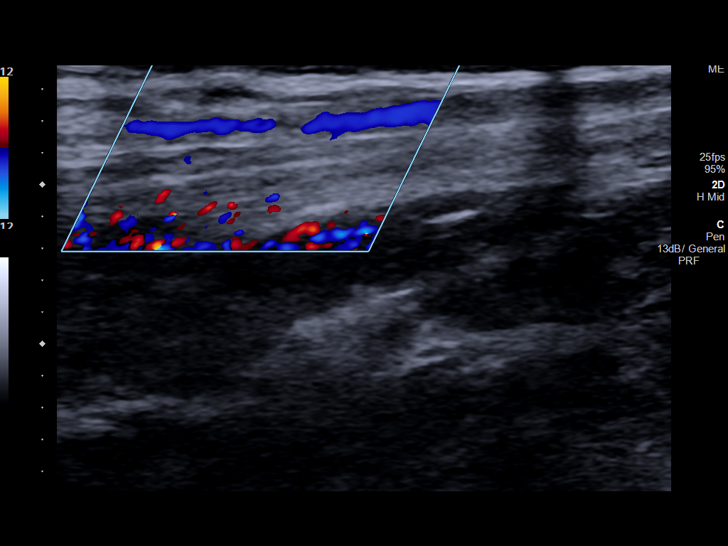
[im 19/47]
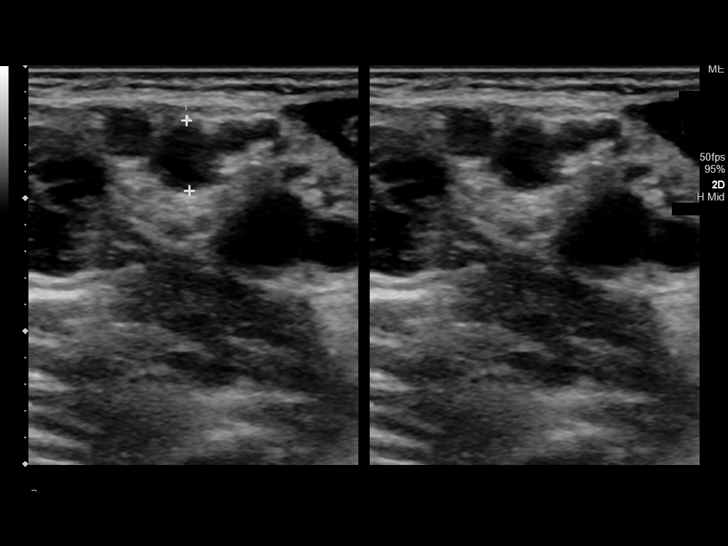
[im 23/47]
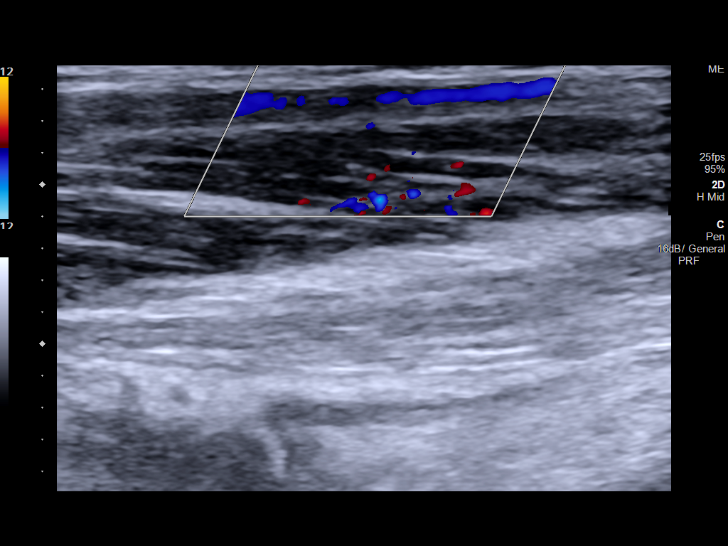
[im 25/47]
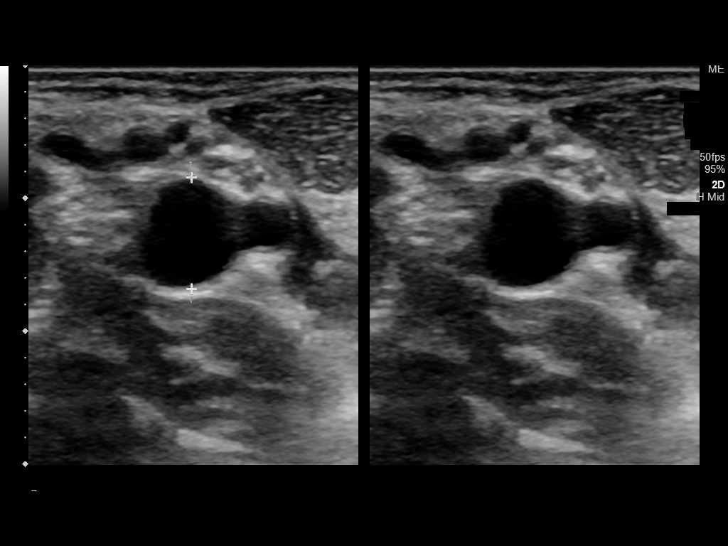
[im 29/47]
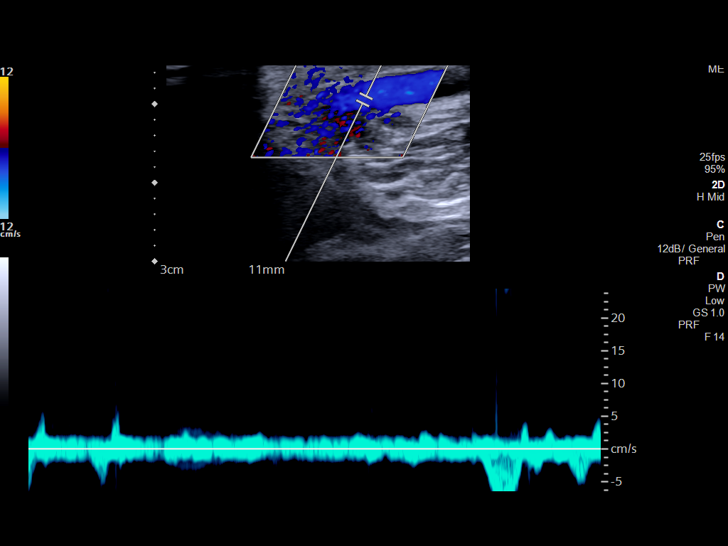
[im 33/47]
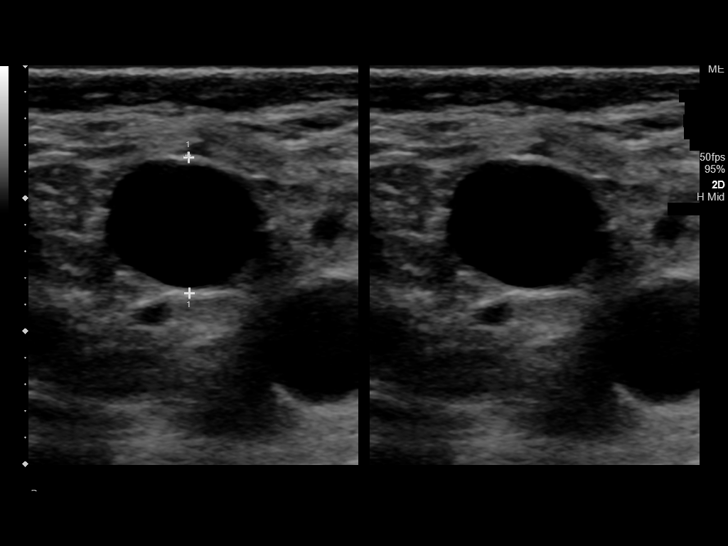
[im 37/47]
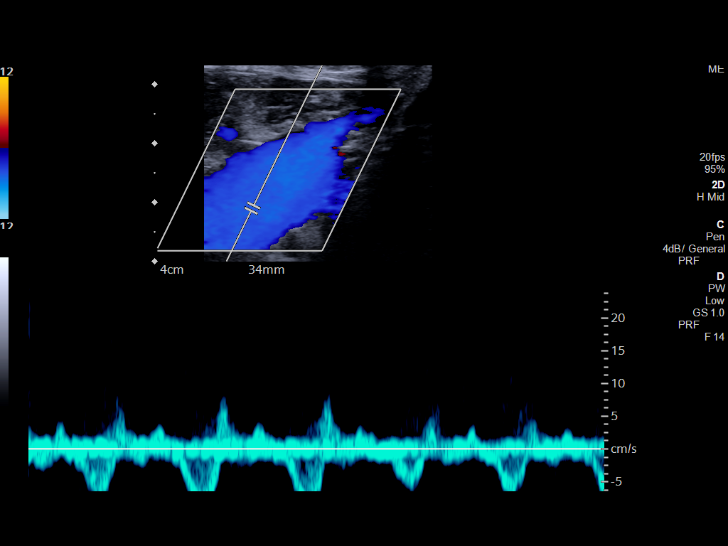
[im 39/47]
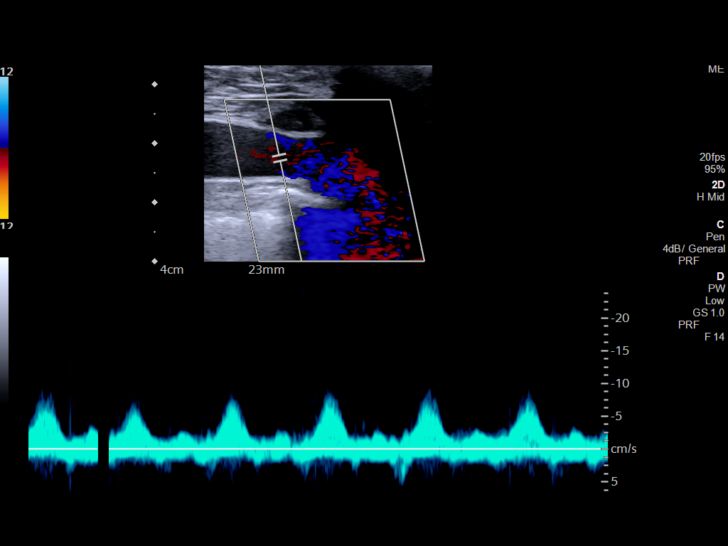
[im 43/47]
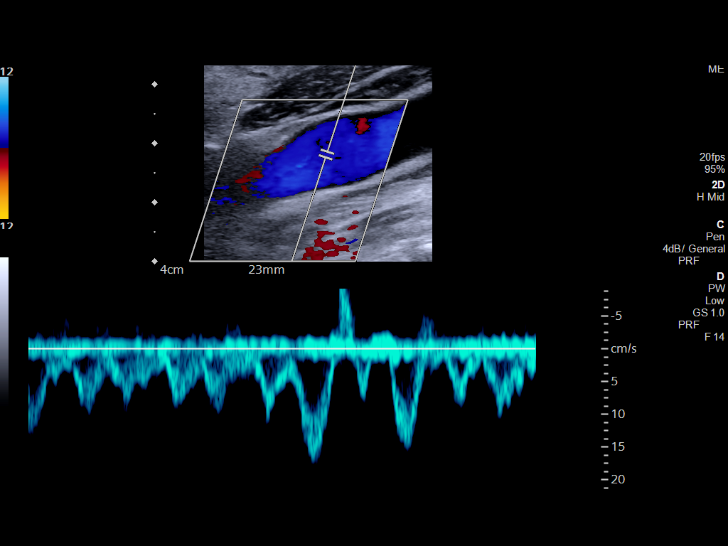
[im 47/47]
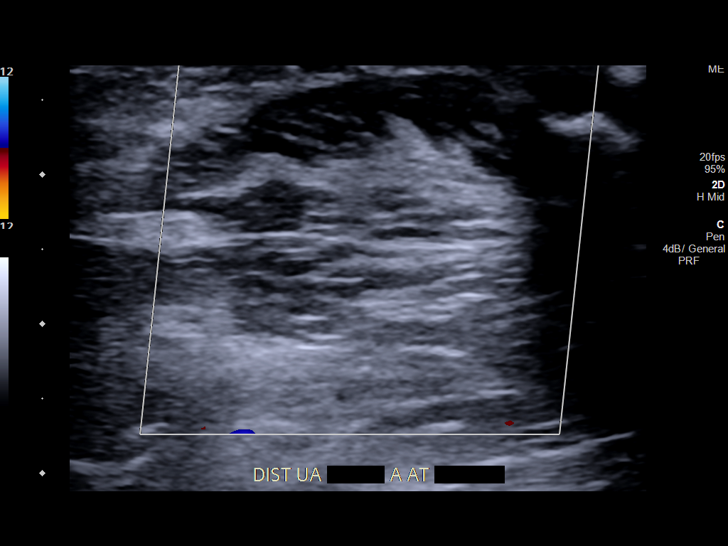

[14 of 24 positions shown; findings below may reference images not displayed]

FINDINGS: LEFT ARTERIES

Wrist Radial Artery:

Size 1.4mm Waveform Triphasic

Wrist Ulnar Artery:

Size 1.4mm Waveform Normal

Prox. Forearm Radial Artery:

Size 2.0mm Waveform Triphasic

Upper Arm Brachial Artery:

Size 2.8mm Waveform Triphasic

LEFT VEINS

Forearm Cephalic Vein:

Not measured given the small size

Forearm Basilic Vein:

1 mm-2 mm

Upper Arm Cephalic Vein:

Diameter 1 mm-2 mm

Upper Arm Basilic Vein:

Prox 5mm Distal 2mm Depth 4mm

Upper Arm Brachial Vein:

Prox 8-9mm Distal 3-5mm Depth 11-12mm

ADDITIONAL LEFT VEINS

Axillary Vein:  10mm

Subclavian Vein:

Patient: Yes Respiratory Phasicity: Present

Internal Jugular Vein:

Patent: Yes    Respiratory Phasicity: Present
IMPRESSION: Left upper extremity mapping study, as above.

## 2020-04-21 IMAGING — DX PORTABLE CHEST - 1 VIEW
1 series · 1 of 1 positions shown · non-contrast
Comparison: 05/03/2018

CLINICAL DATA: Shortness of breath.

EXAM:
PORTABLE CHEST 1 VIEW

[chest ap]
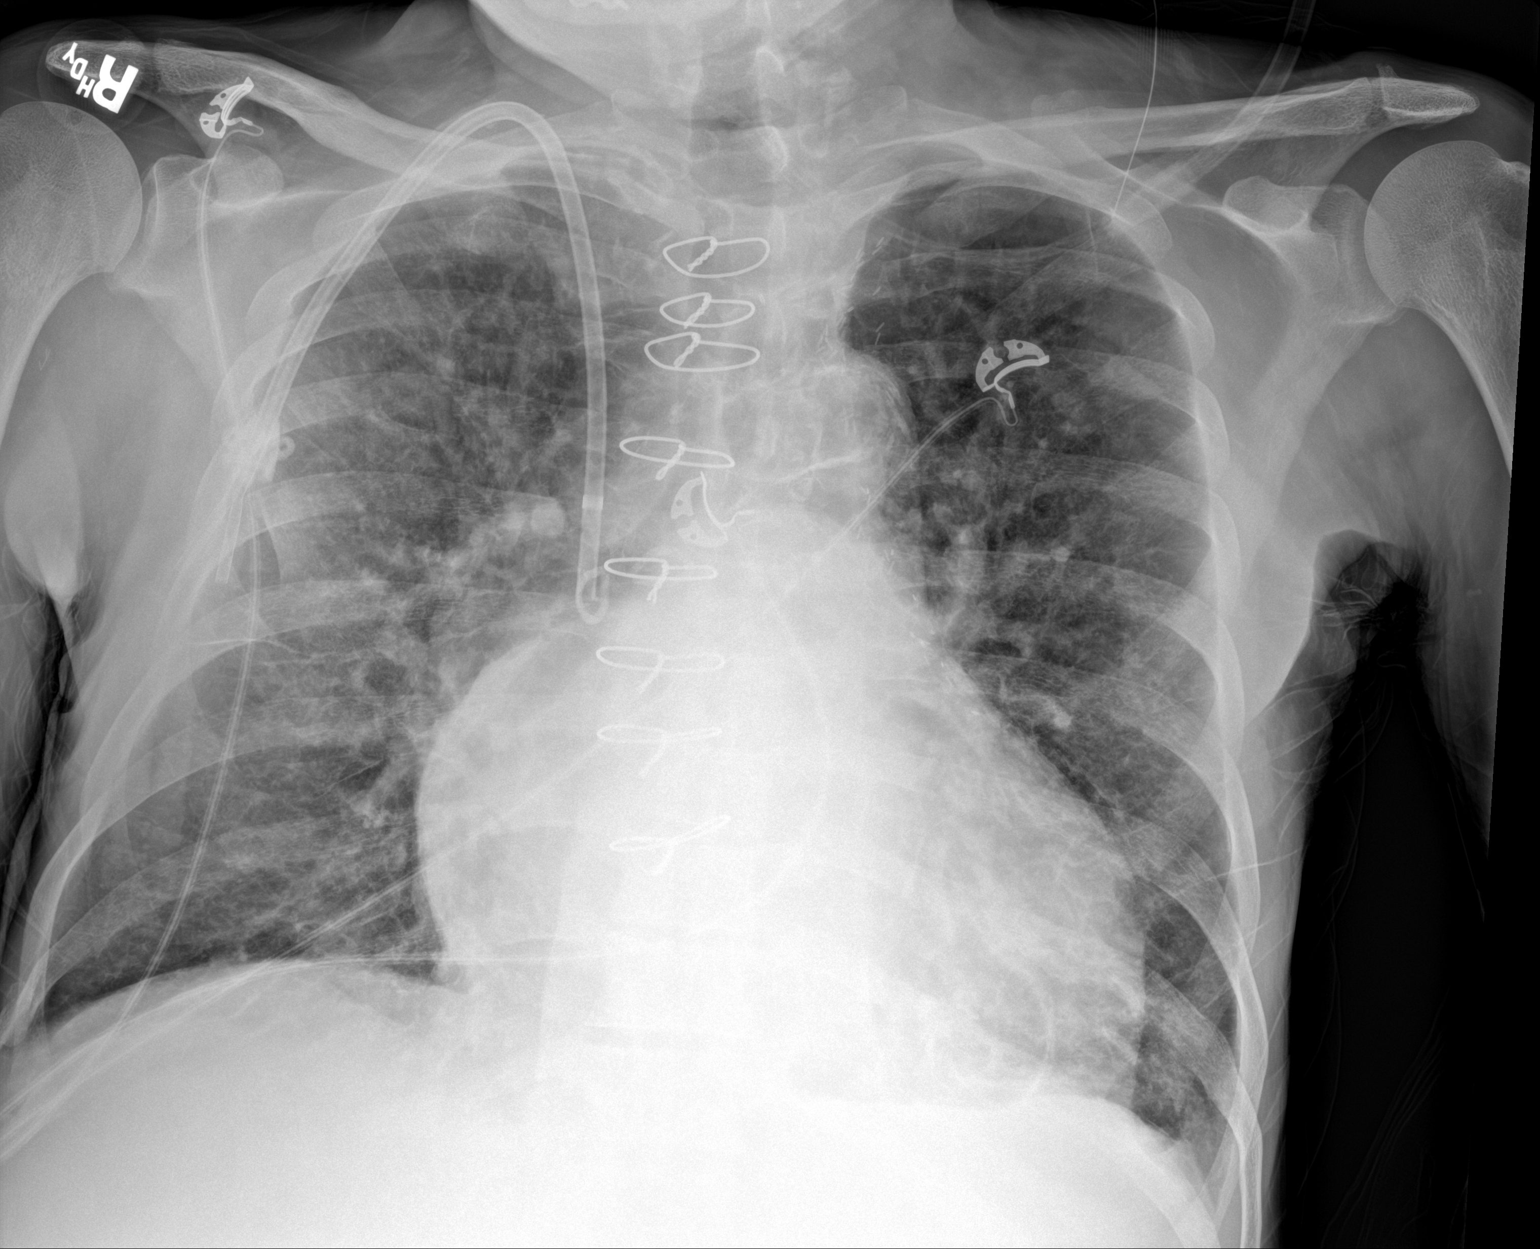

[1 of 1 positions shown; findings below may reference images not displayed]

FINDINGS: The heart is enlarged but stable. Right IJ dialysis catheter is
stable.

Central pulmonary vascular congestion without overt pulmonary edema.
No definite pleural effusions or infiltrates.
IMPRESSION: Cardiac enlargement and central vascular congestion without overt
pulmonary edema or pleural effusions.

## 2020-04-28 IMAGING — US US PARACENTESIS
1 series · 5 of 5 positions shown · non-contrast
Comparison: none

INDICATION: 57-year-old male with cirrhosis and recurrent large volume ascites.

[Series 1: us paracentesis · 5 of 5 slices shown]
[im 1/5]
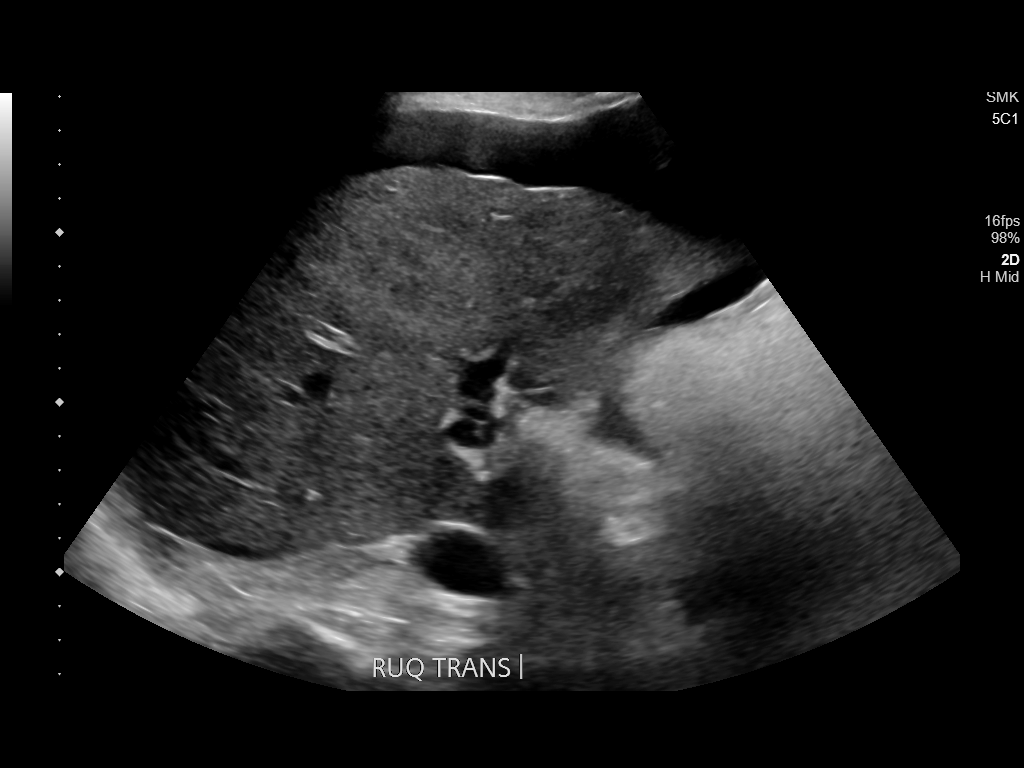
[im 2/5]
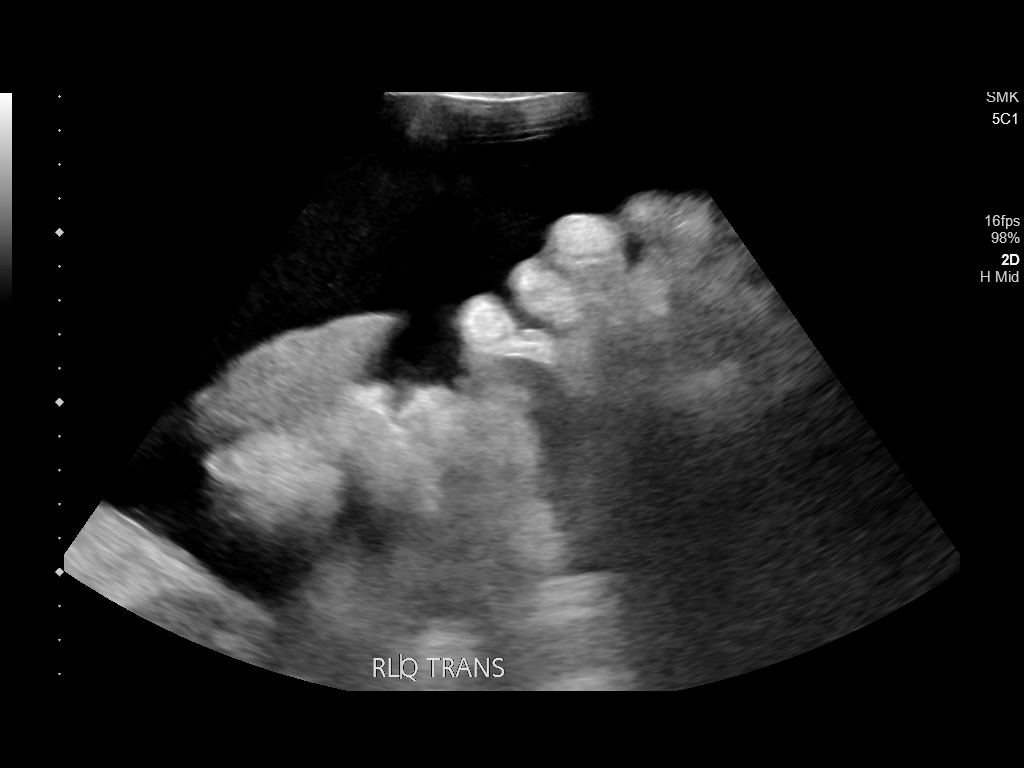
[im 3/5]
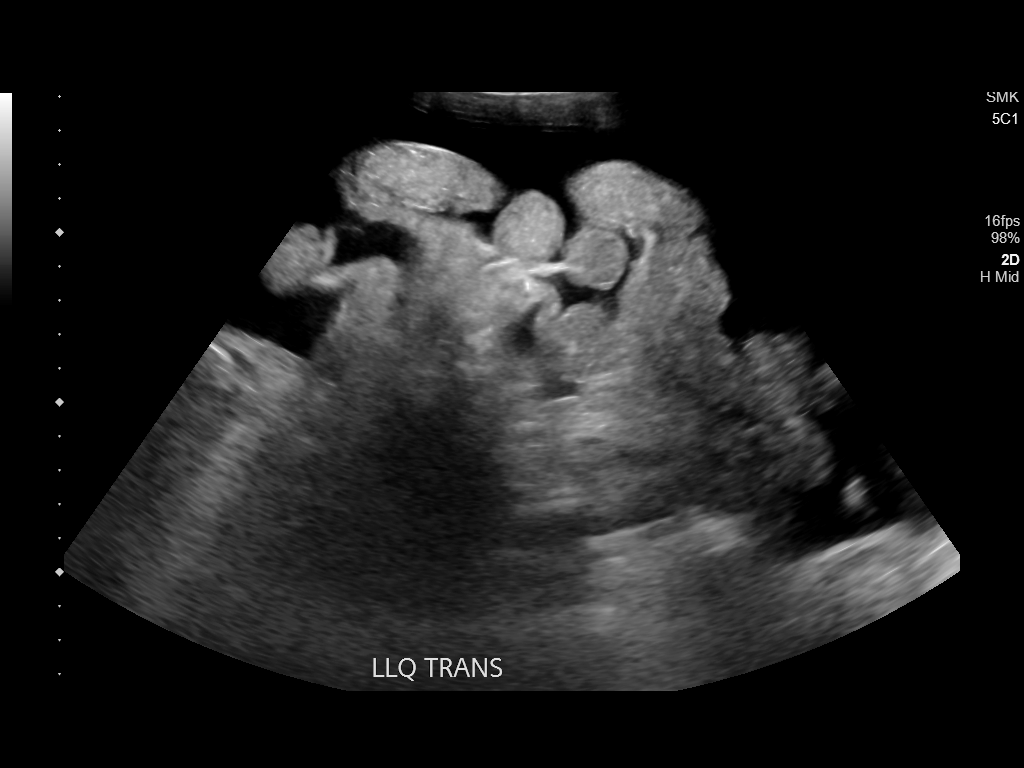
[im 4/5]
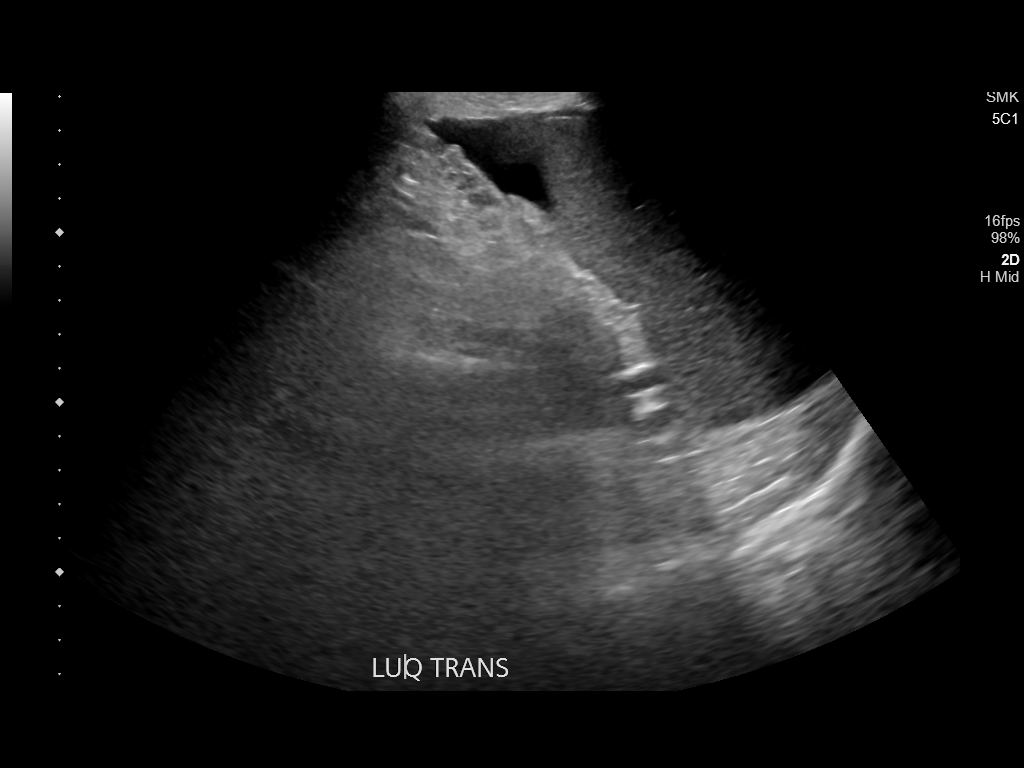
[im 5/5]
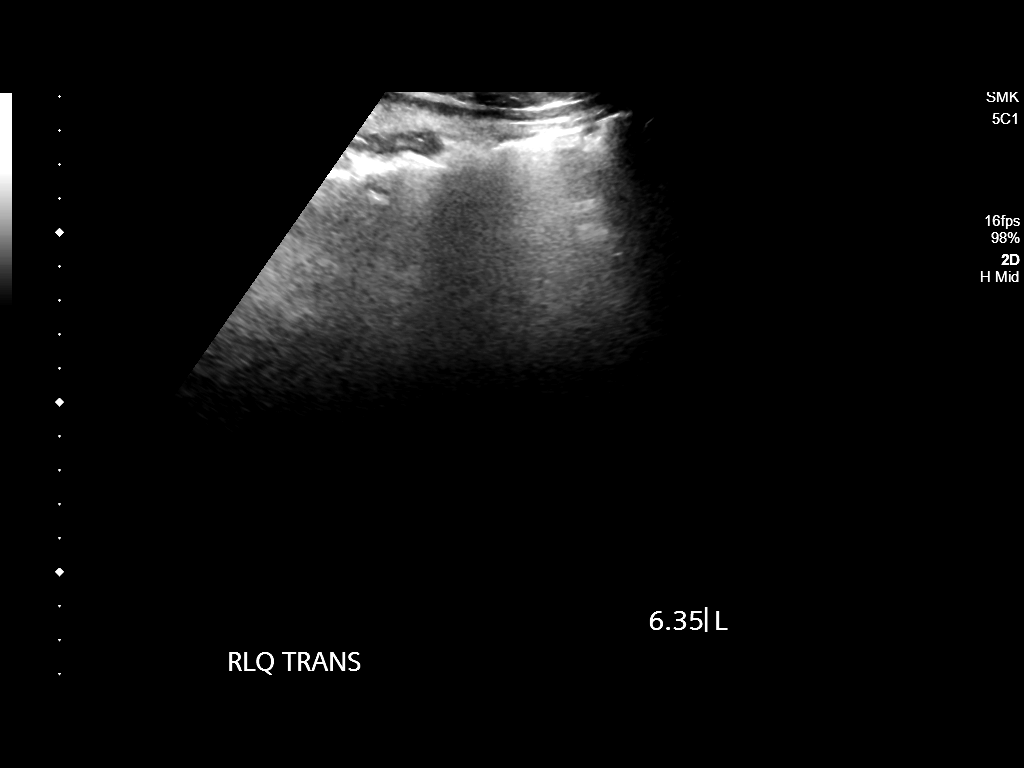

[5 of 5 positions shown; findings below may reference images not displayed]

EXAM:
ULTRASOUND GUIDED  PARACENTESIS

MEDICATIONS:
None.

COMPLICATIONS:
None immediate.

PROCEDURE:
Informed written consent was obtained from the patient after a
discussion of the risks, benefits and alternatives to treatment. A
timeout was performed prior to the initiation of the procedure.

Initial ultrasound scanning demonstrates a large amount of ascites
within the right lower abdominal quadrant. The right lower abdomen
was prepped and draped in the usual sterile fashion. 1% lidocaine
with epinephrine was used for local anesthesia.

Following this, a 6 Fr Safe-T-Centesis catheter was introduced. An
ultrasound image was saved for documentation purposes. The
paracentesis was performed. The catheter was removed and a dressing
was applied. The patient tolerated the procedure well without
immediate post procedural complication.
FINDINGS: A total of approximately 1139 mL of clear yellow ascitic fluid was
removed.
IMPRESSION: Successful ultrasound-guided paracentesis yielding 6.35 liters of
peritoneal fluid.

## 2020-04-28 IMAGING — DX DG CHEST 1V PORT
1 series · 1 of 1 positions shown · non-contrast
Comparison: July 10, 2017

CLINICAL DATA: Shortness of Breath

EXAM:
PORTABLE CHEST 1 VIEW

[chest ap]
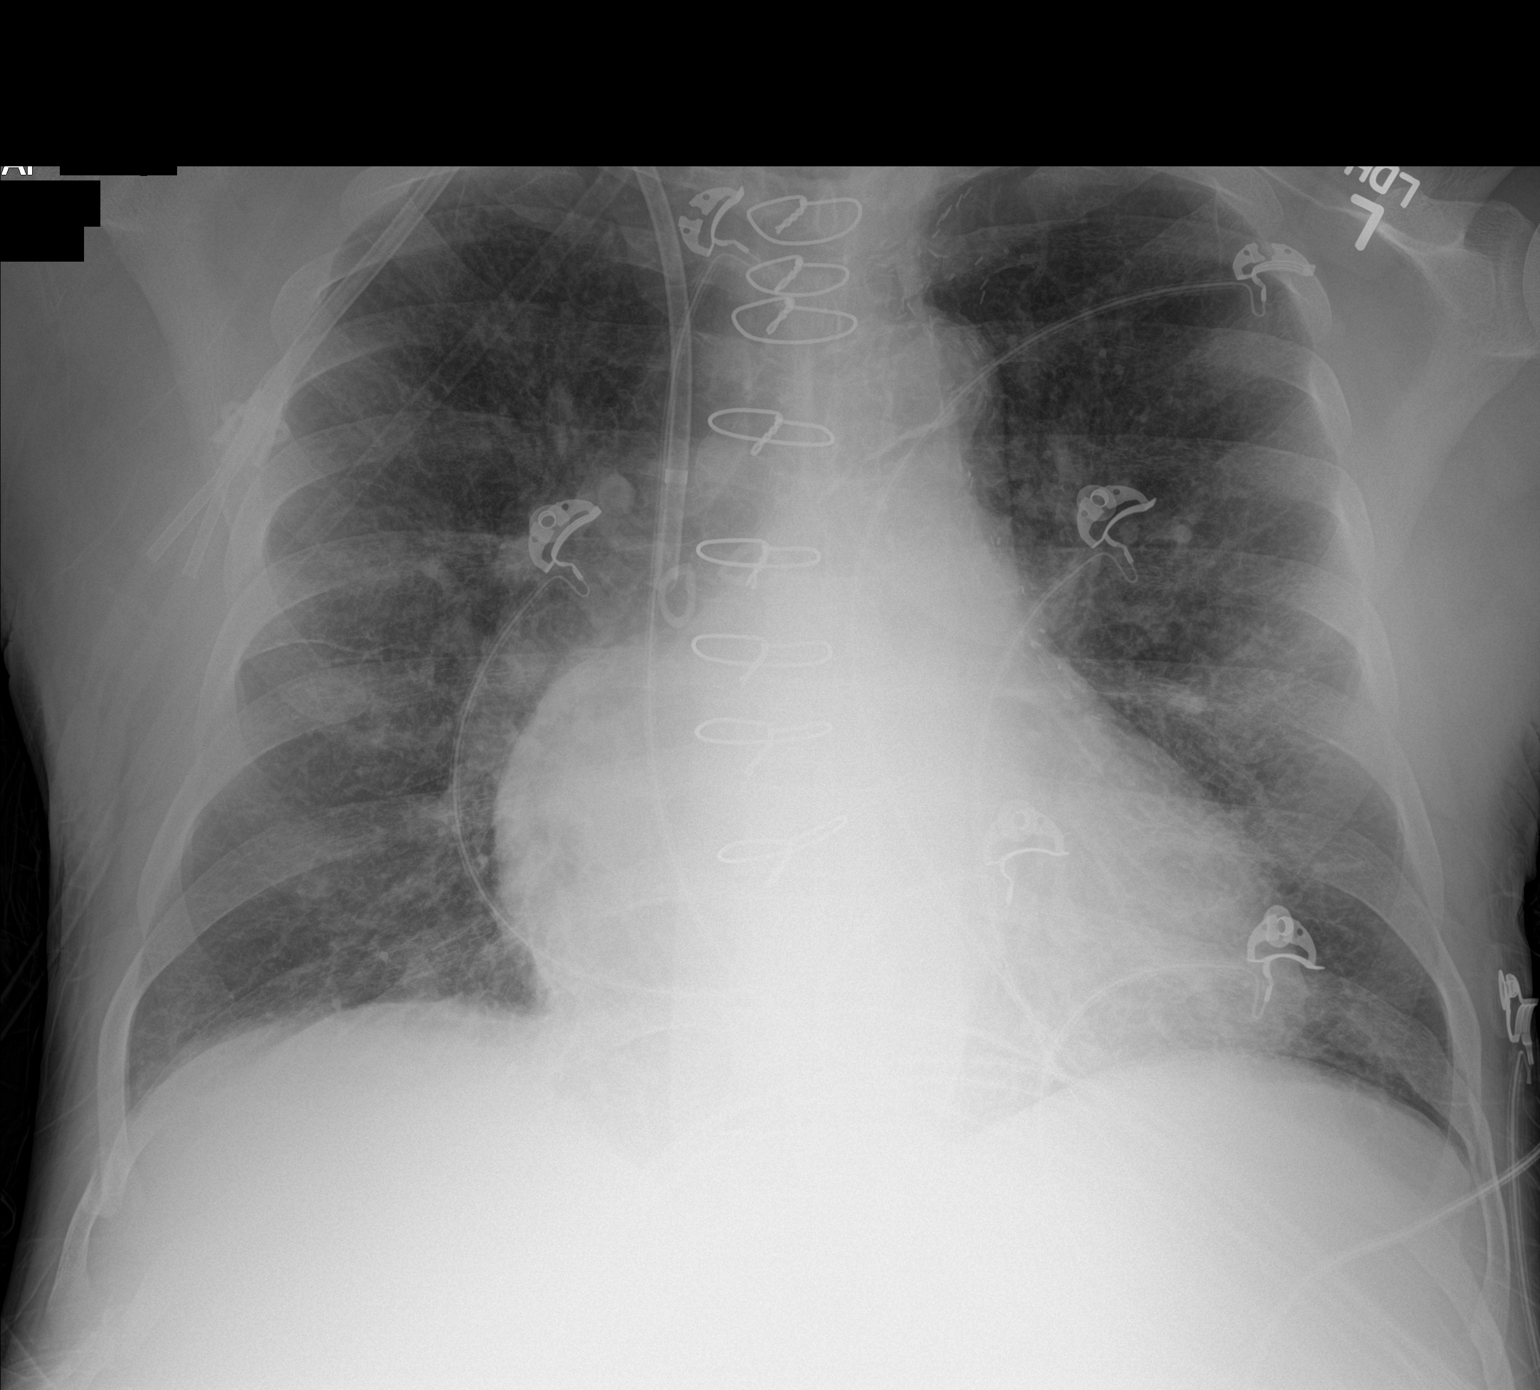

[1 of 1 positions shown; findings below may reference images not displayed]

FINDINGS: Central catheter tip is in the superior vena cava. No pneumothorax.
There is cardiomegaly with pulmonary venous hypertension. There is
slight interstitial edema. There is no consolidation. No appreciable
pleural effusion. There is no adenopathy. There is aortic
atherosclerosis. No evident bone lesions.
IMPRESSION: Pulmonary vascular congestion with mild edema. No consolidation.
There is aortic atherosclerosis. Central catheter tip in superior
vena cava. No pneumothorax.

Aortic Atherosclerosis ([id number].U).

## 2020-04-30 IMAGING — DX PORTABLE CHEST - 1 VIEW
1 series · 1 of 1 positions shown · non-contrast
Comparison: May 27, 2018

CLINICAL DATA: Shortness of breath and nausea.  Renal failure

EXAM:
PORTABLE CHEST 1 VIEW

[chest ap]
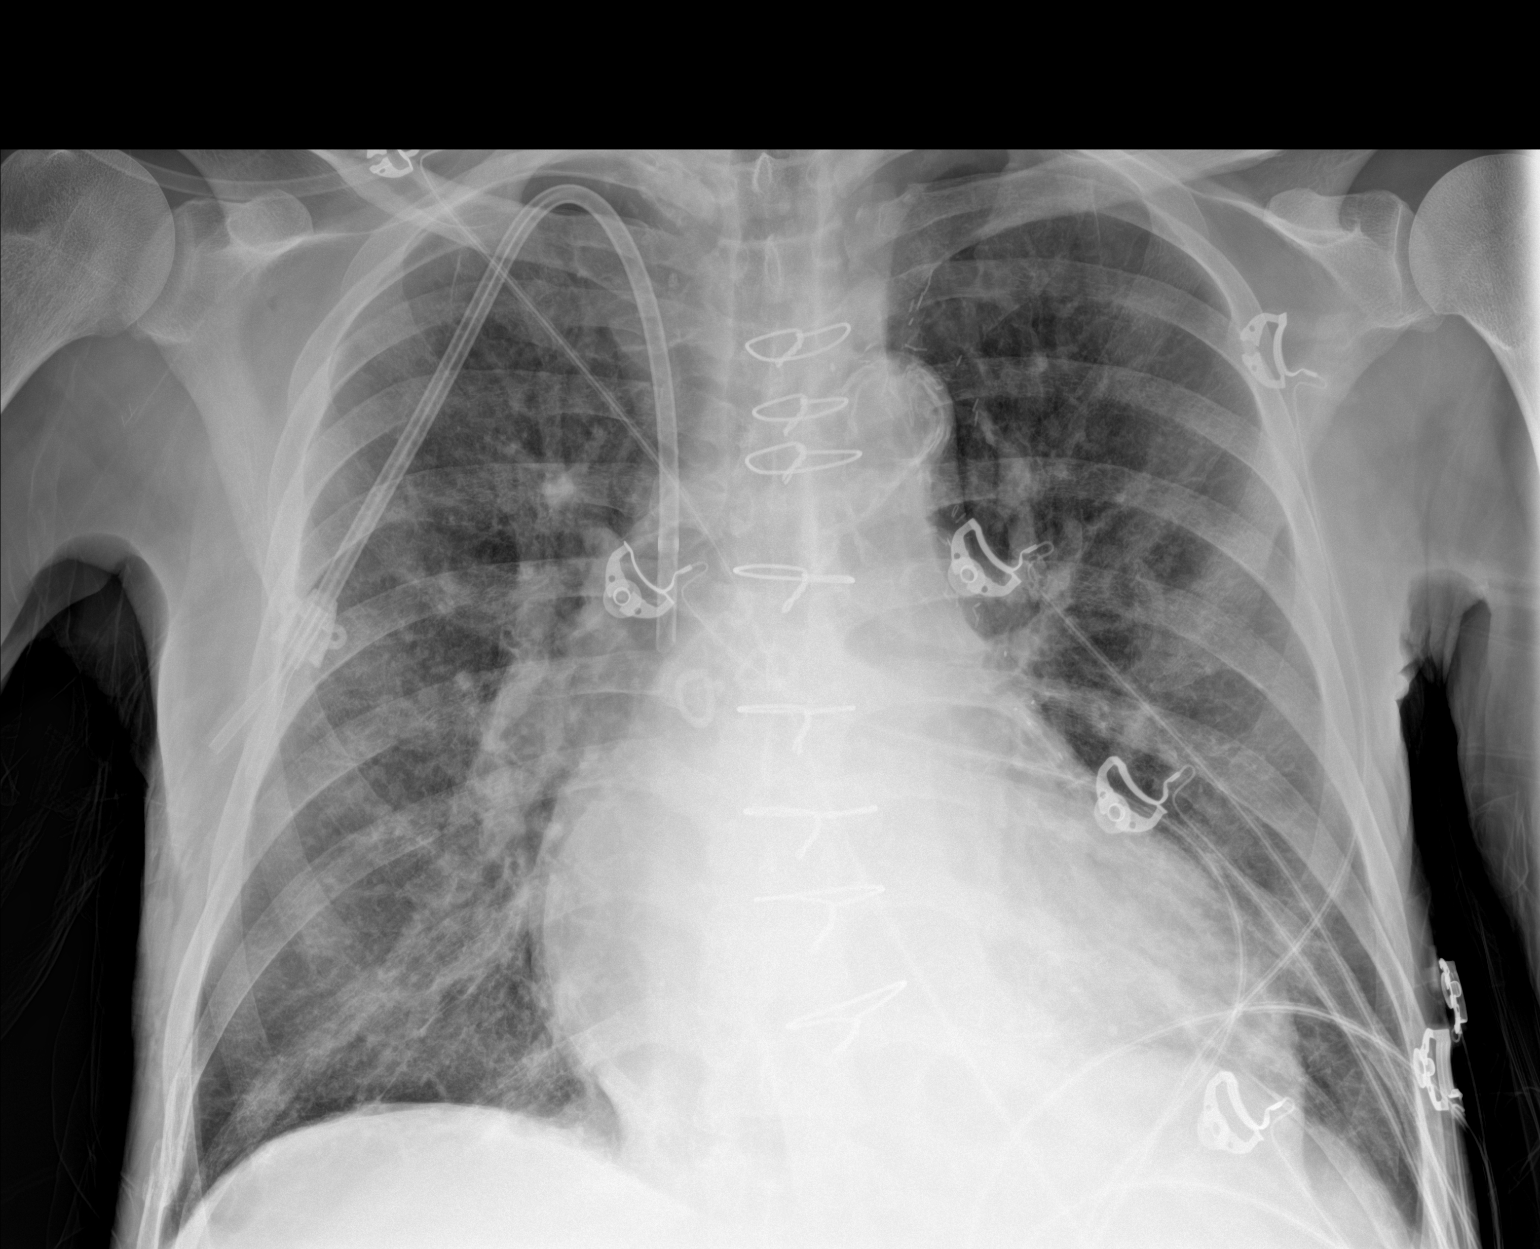

[1 of 1 positions shown; findings below may reference images not displayed]

FINDINGS: Central catheter tip is in the superior vena cava. No pneumothorax.
There is cardiomegaly with pulmonary venous hypertension. There is
slight bibasilar interstitial edema. No airspace consolidation or
pleural effusion. There is aortic atherosclerosis. Patient is status
post coronary artery bypass grafting. No adenopathy. No bone
lesions.
IMPRESSION: Pulmonary vascular congestion with slight bibasilar interstitial
edema. No airspace consolidation.

Central catheter tip in superior vena cava. No pneumothorax. No
adenopathy. Aortic Atherosclerosis ([id number]).

## 2020-05-09 IMAGING — US US PARACENTESIS
1 series · 9 of 9 positions shown · non-contrast
Comparison: none

INDICATION: 57-year-old with recurrent ascites and end-stage renal disease.
Patient presents for a therapeutic paracentesis.

[Series 1: us paracentesis · 9 of 9 slices shown]
[im 1/9]
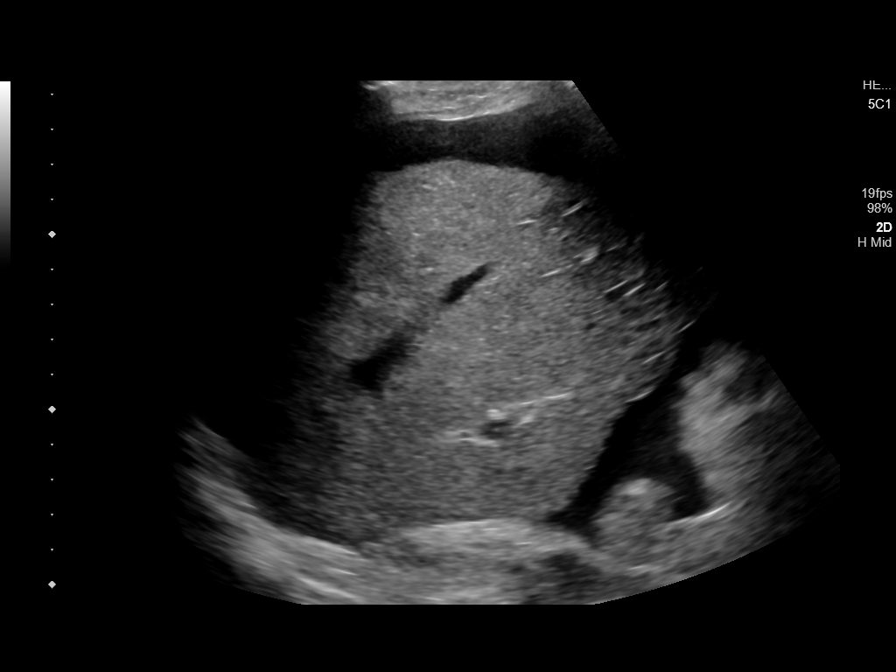
[im 2/9]
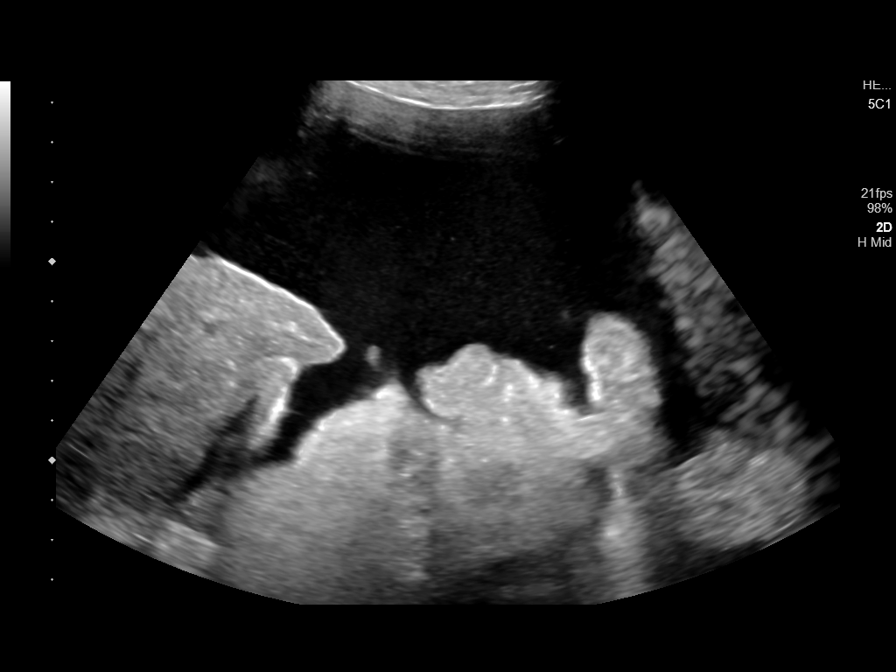
[im 3/9]
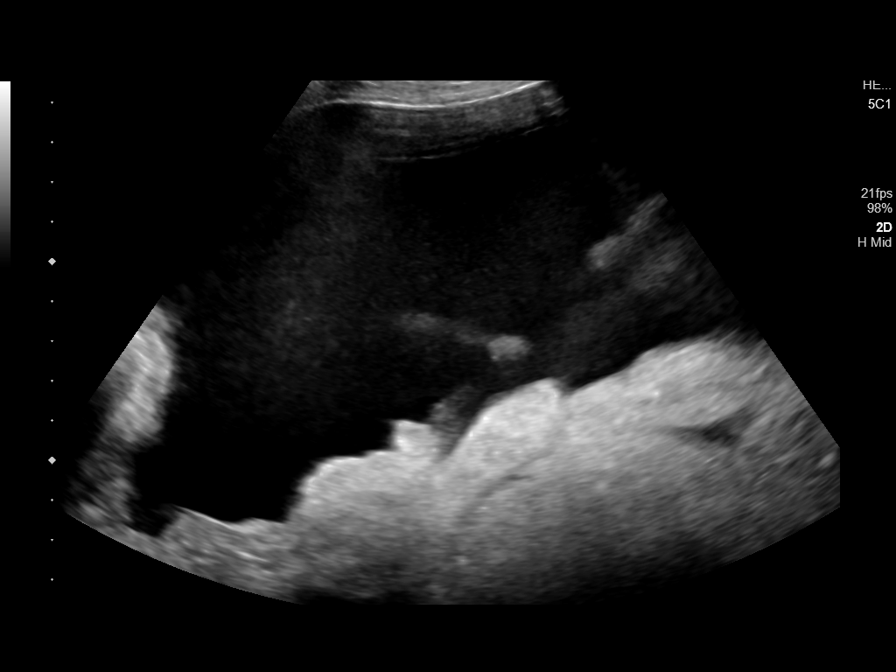
[im 4/9]
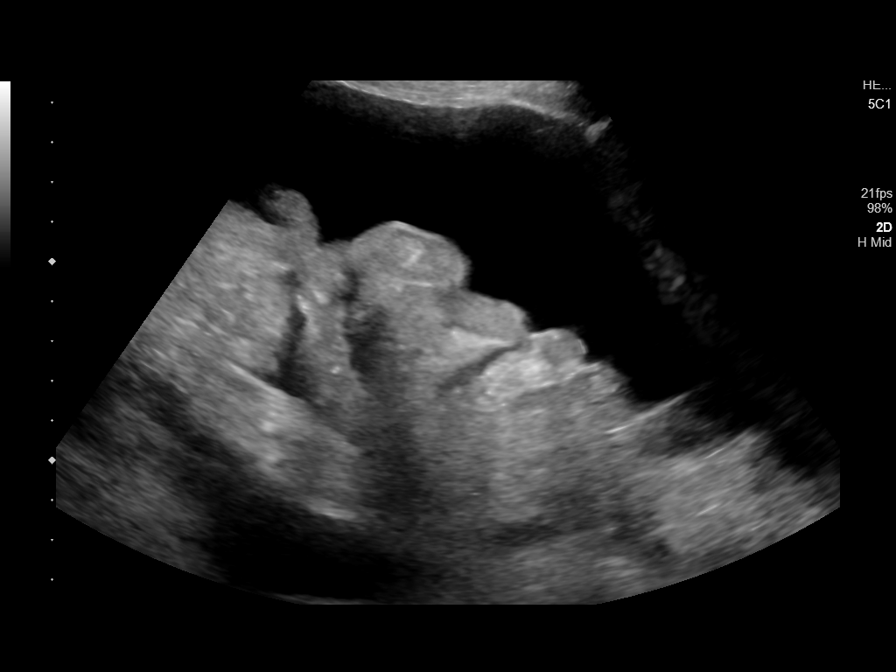
[im 5/9]
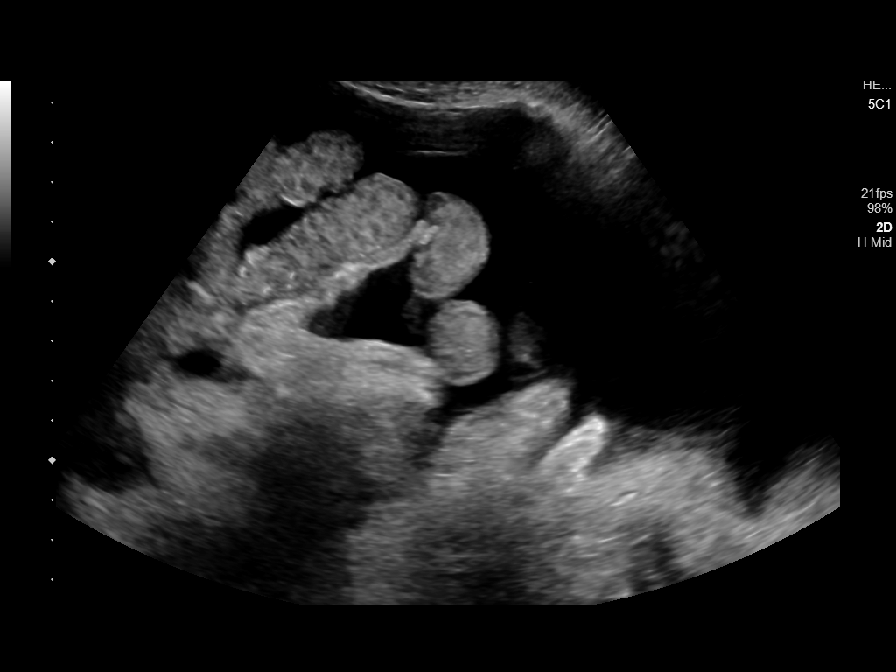
[im 6/9]
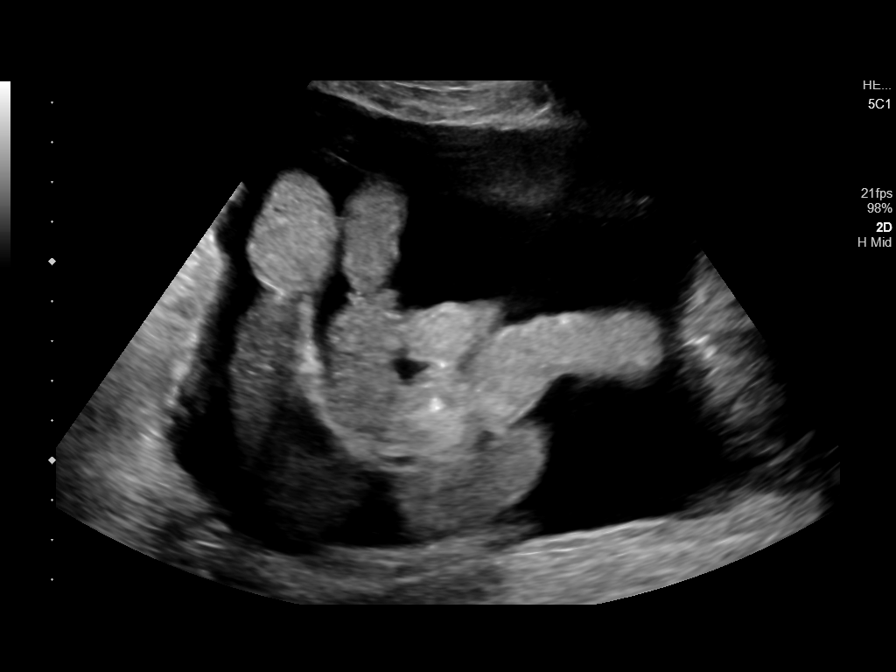
[im 7/9]
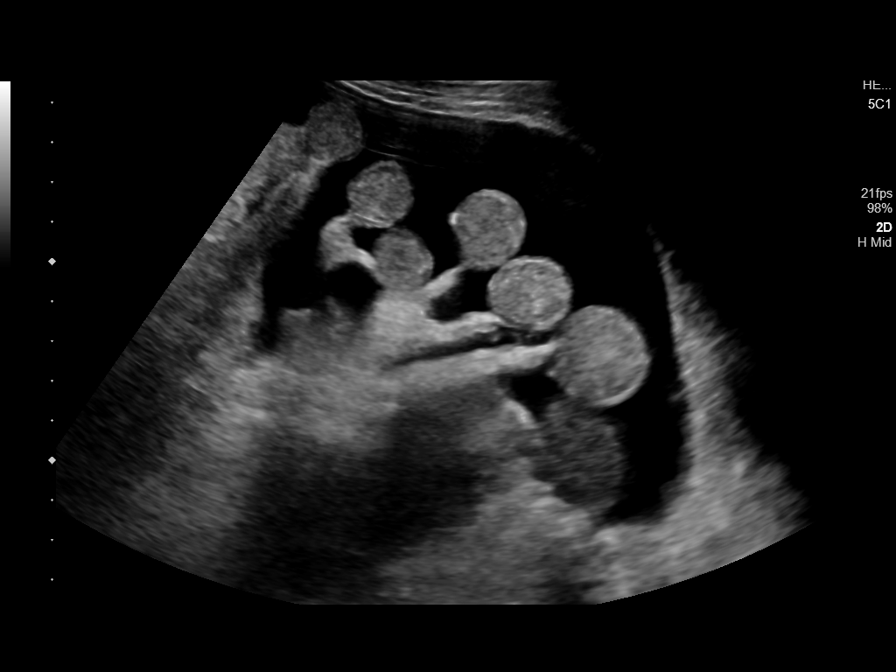
[im 8/9]
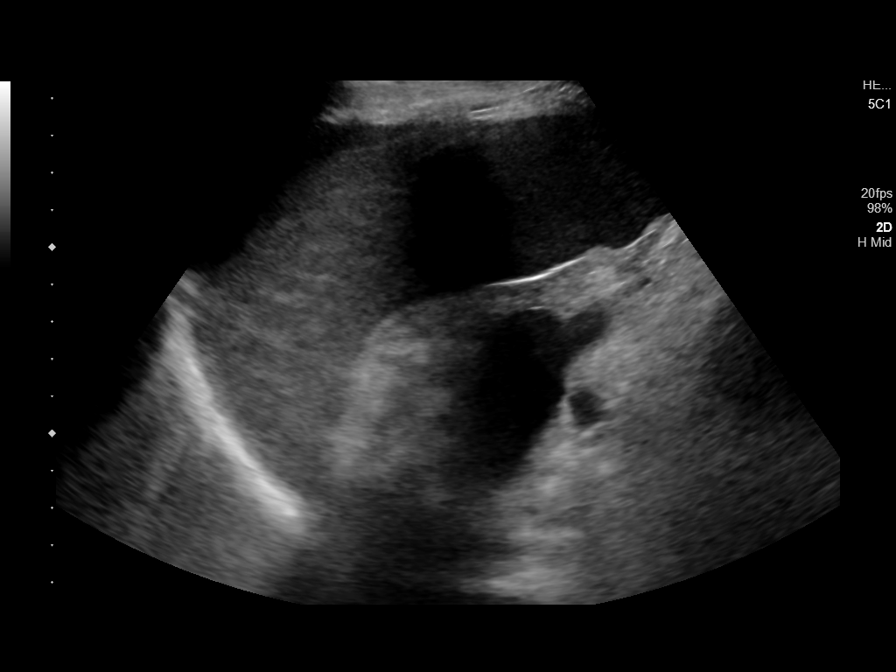
[im 9/9]
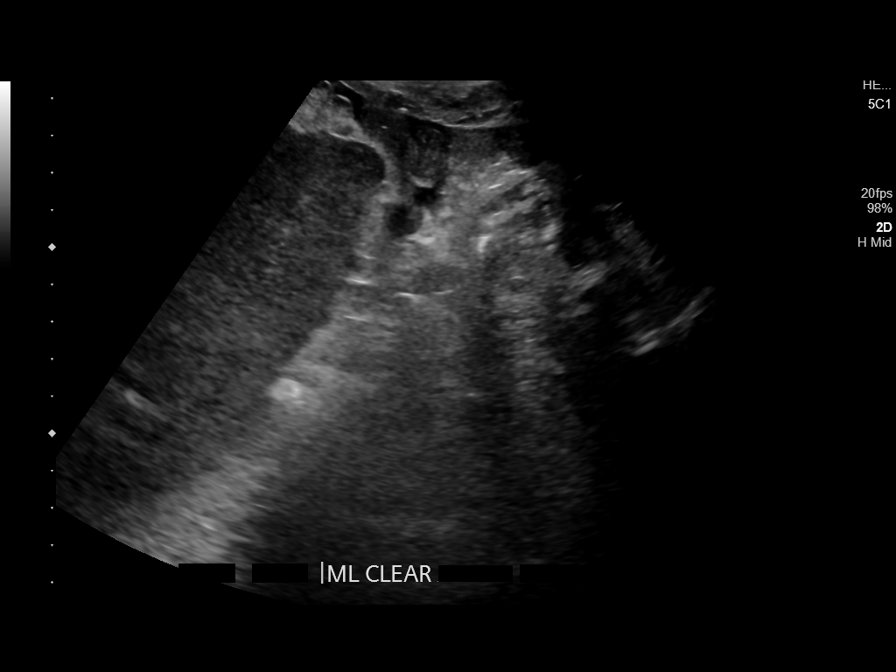

[9 of 9 positions shown; findings below may reference images not displayed]

EXAM:
ULTRASOUND GUIDED THERAPEUTIC PARACENTESIS

MEDICATIONS:
None.

COMPLICATIONS:
None immediate.

PROCEDURE:
Informed written consent was obtained from the patient after a
discussion of the risks, benefits and alternatives to treatment. A
timeout was performed prior to the initiation of the procedure.

Initial ultrasound scanning demonstrates a large amount of ascites
within the right lower abdominal quadrant. The right lower abdomen
was prepped and draped in the usual sterile fashion. 1% lidocaine
was used for local anesthesia.

Following this, a 6 Fr Safe-T-Centesis catheter was introduced. An
ultrasound image was saved for documentation purposes. The
paracentesis was performed. The catheter was removed and a dressing
was applied. The patient tolerated the procedure well without
immediate post procedural complication.
FINDINGS: A total of approximately 7 L of amber fluid was removed.
IMPRESSION: Successful ultrasound-guided paracentesis yielding 7 liters of
peritoneal fluid.

## 2020-05-23 IMAGING — US US PARACENTESIS
1 series · 9 of 9 positions shown · non-contrast
Comparison: none

INDICATION: 57-year-old with recurrent large volume ascites.

[Series 1: us paracentesis · 9 of 9 slices shown]
[im 1/9]
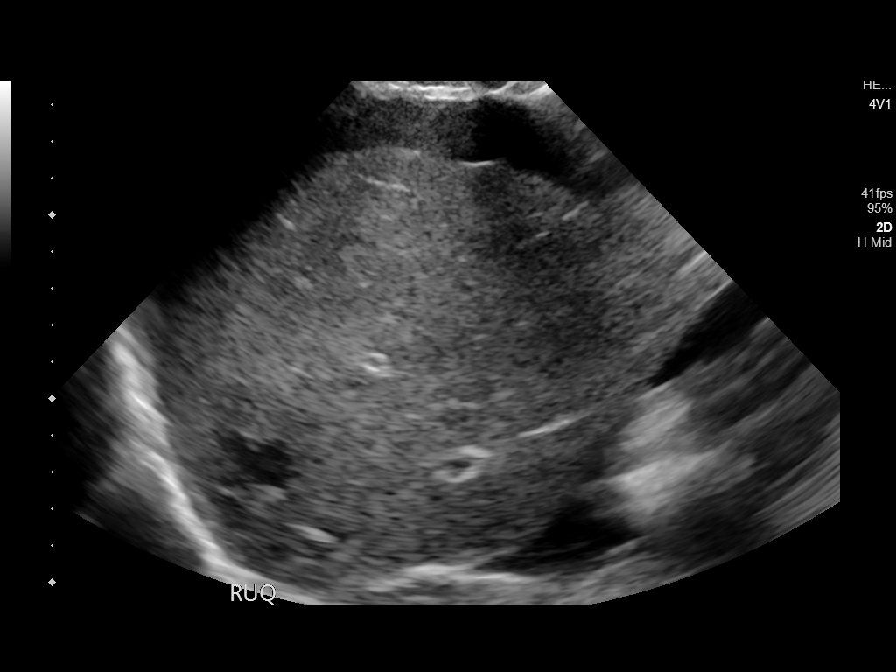
[im 2/9]
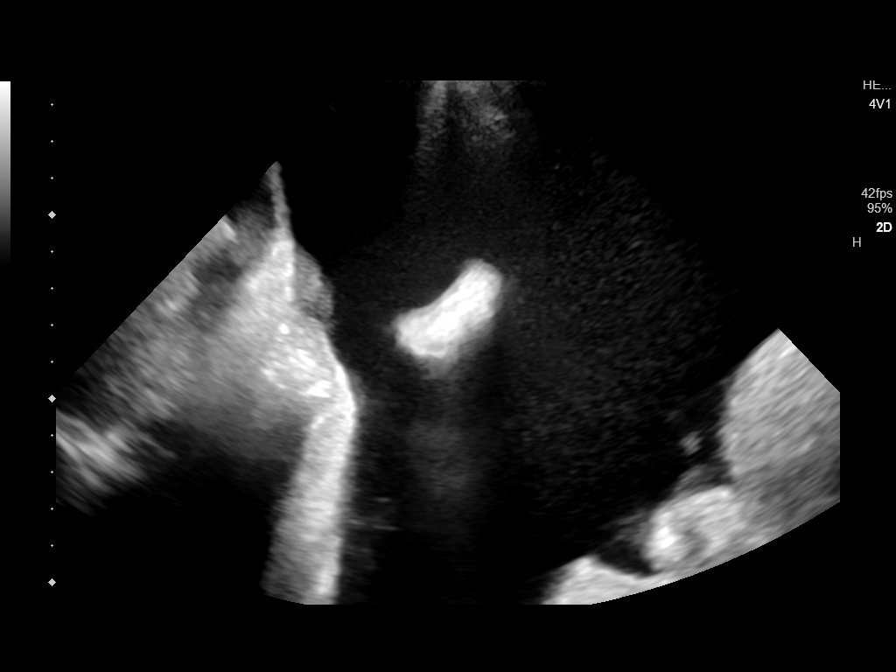
[im 3/9]
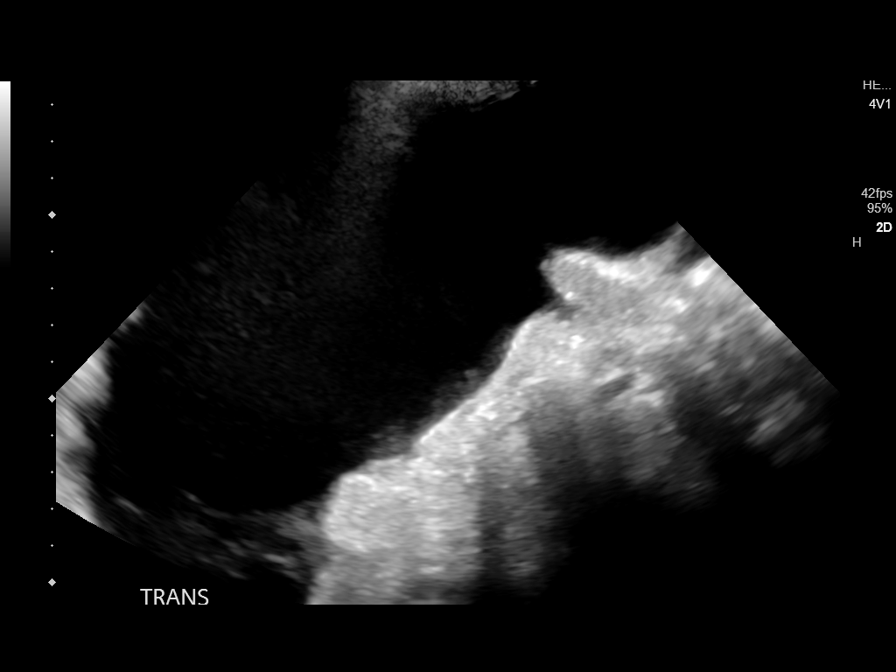
[im 4/9]
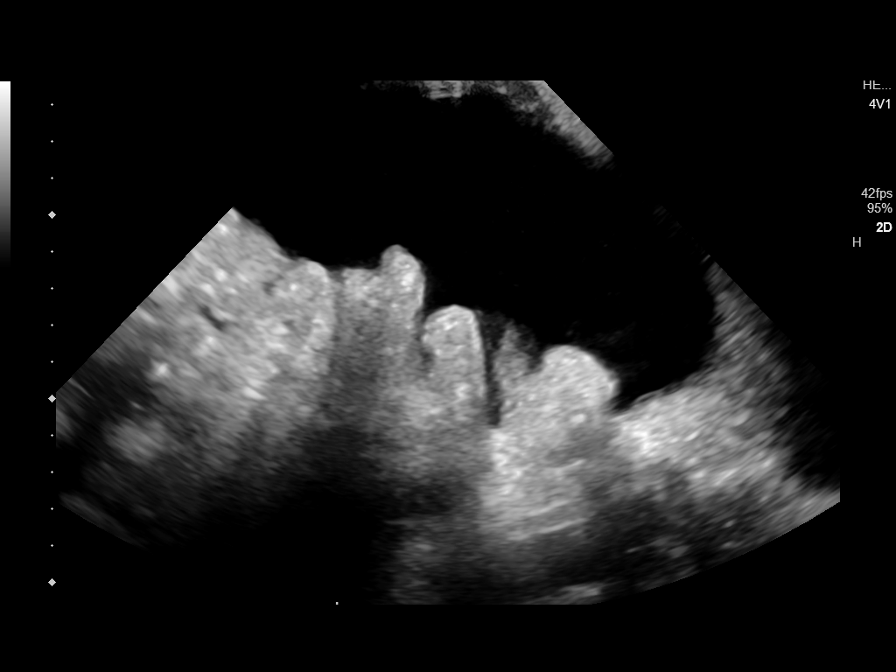
[im 5/9]
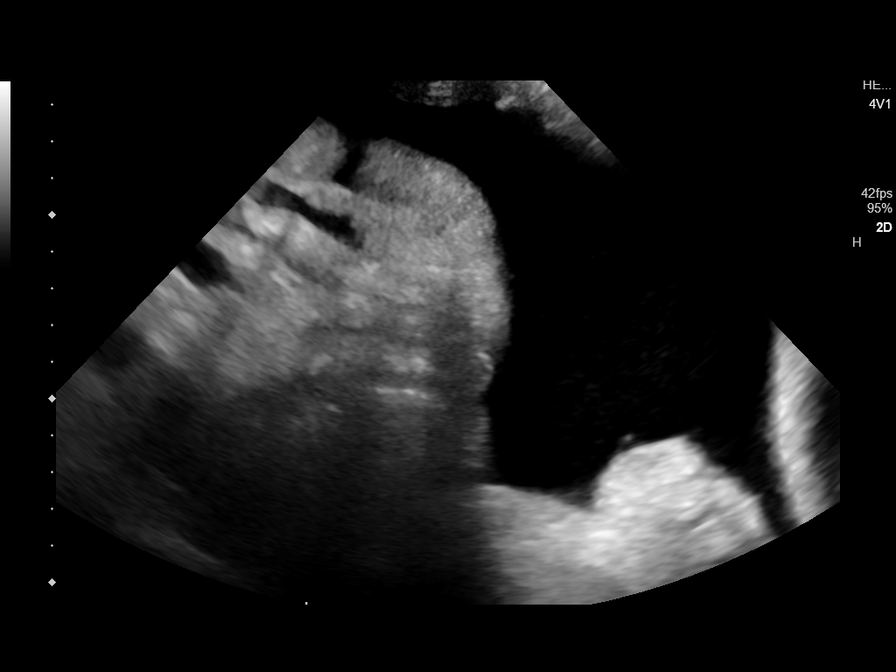
[im 6/9]
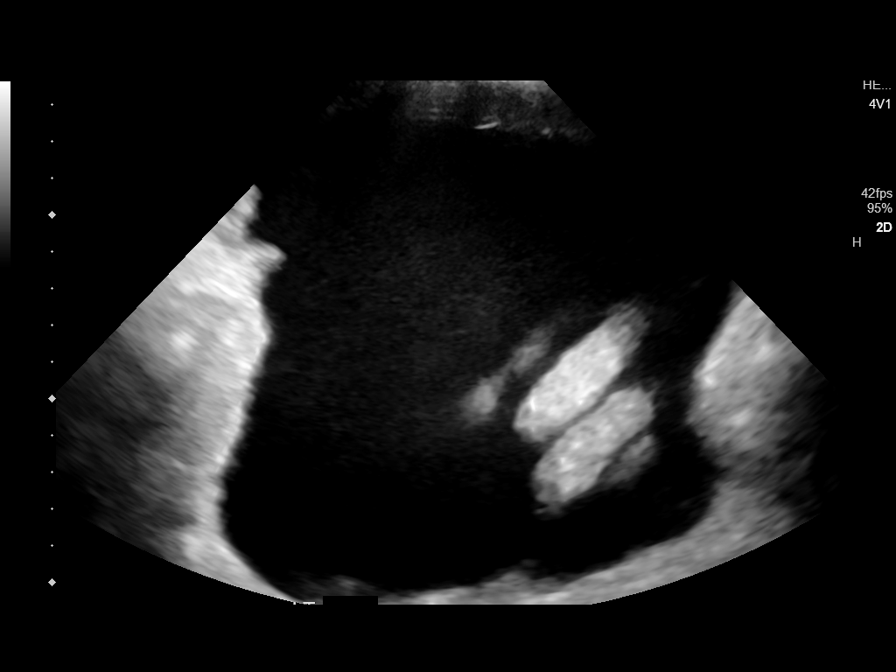
[im 7/9]
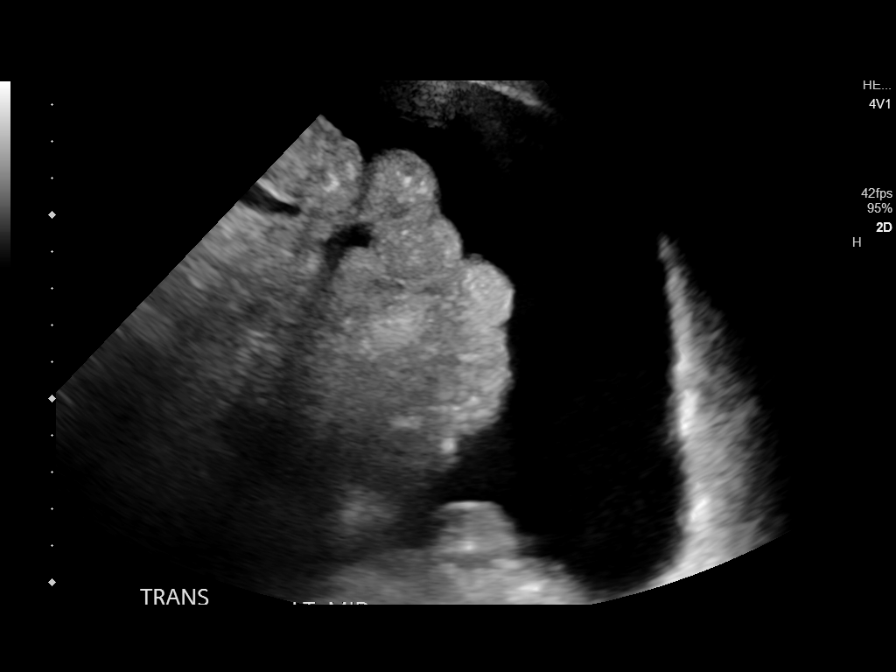
[im 8/9]
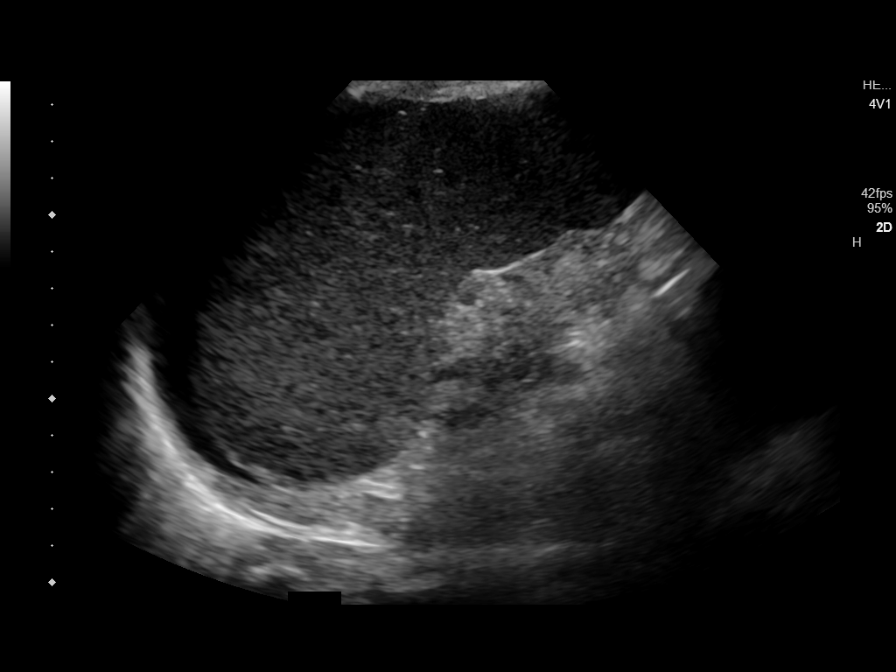
[im 9/9]
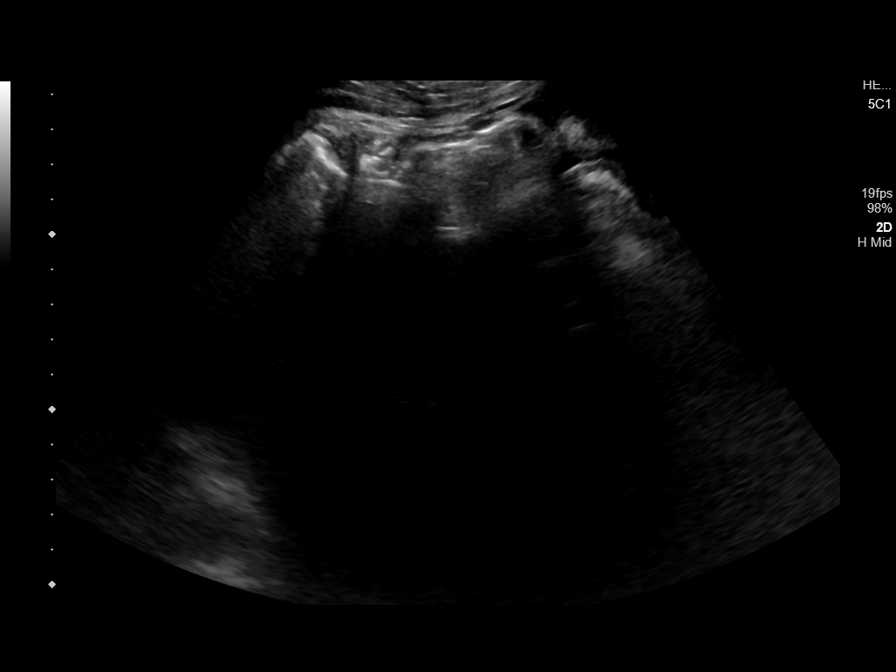

[9 of 9 positions shown; findings below may reference images not displayed]

EXAM:
ULTRASOUND GUIDED PARACENTESIS

MEDICATIONS:
None.

COMPLICATIONS:
None immediate.

PROCEDURE:
Informed written consent was obtained from the patient after a
discussion of the risks, benefits and alternatives to treatment. A
timeout was performed prior to the initiation of the procedure.

Initial ultrasound scanning demonstrates a large amount of ascites
within the right lower abdominal quadrant. The right lower abdomen
was prepped and draped in the usual sterile fashion. 1% lidocaine
was used for local anesthesia.

Following this, a 6 Fr Safe-T-Centesis catheter was introduced. An
ultrasound image was saved for documentation purposes. The
paracentesis was performed. The catheter was removed and a dressing
was applied. The patient tolerated the procedure well without
immediate post procedural complication.
FINDINGS: A total of approximately 7.75 L of yellow fluid was removed.
IMPRESSION: Successful ultrasound-guided paracentesis yielding 7.75 liters of
peritoneal fluid.

Patient was scheduled to get albumin at dialysis after the
paracentesis..

## 2020-06-02 IMAGING — US US PARACENTESIS
1 series · 5 of 5 positions shown · non-contrast
Comparison: none

INDICATION: 57-year-old male with alcoholic cirrhosis and recurrent symptomatic
large volume ascites. He presents for large volume paracentesis.

[Series 1: us paracentesis · 5 of 5 slices shown]
[im 1/5]
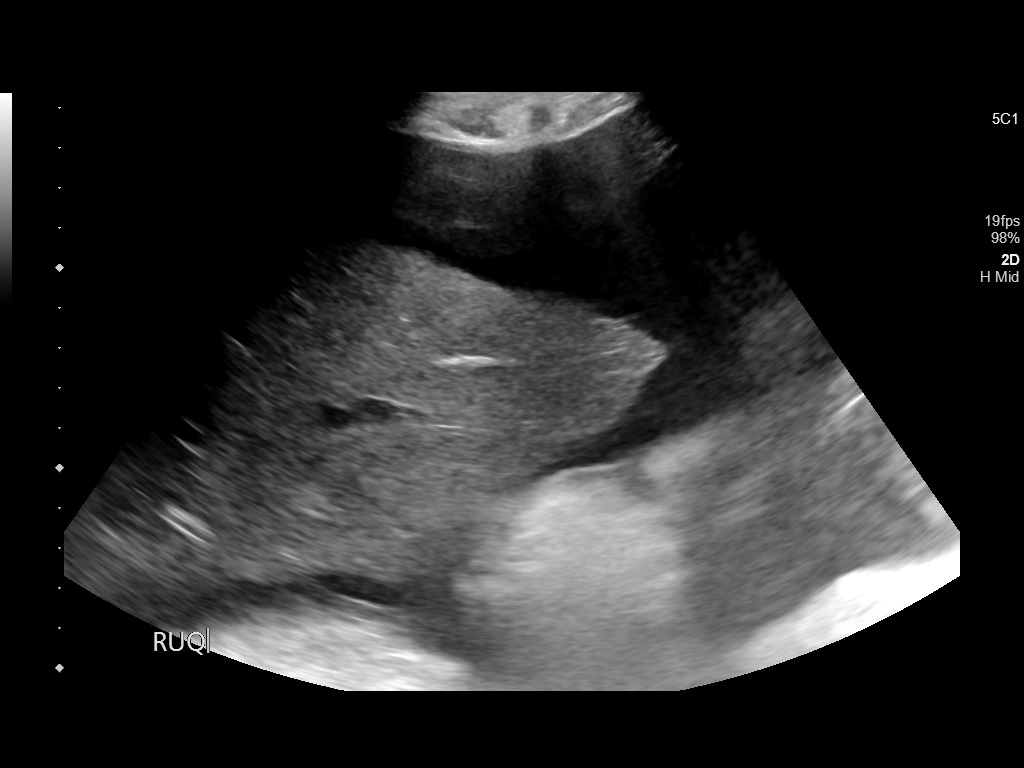
[im 2/5]
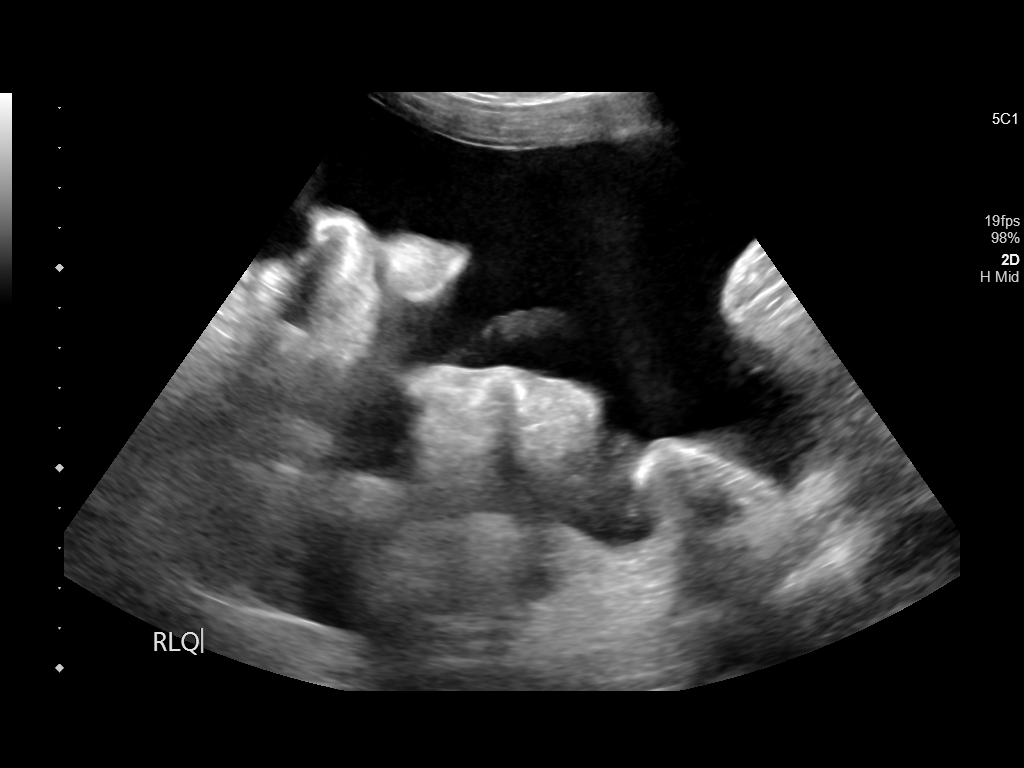
[im 3/5]
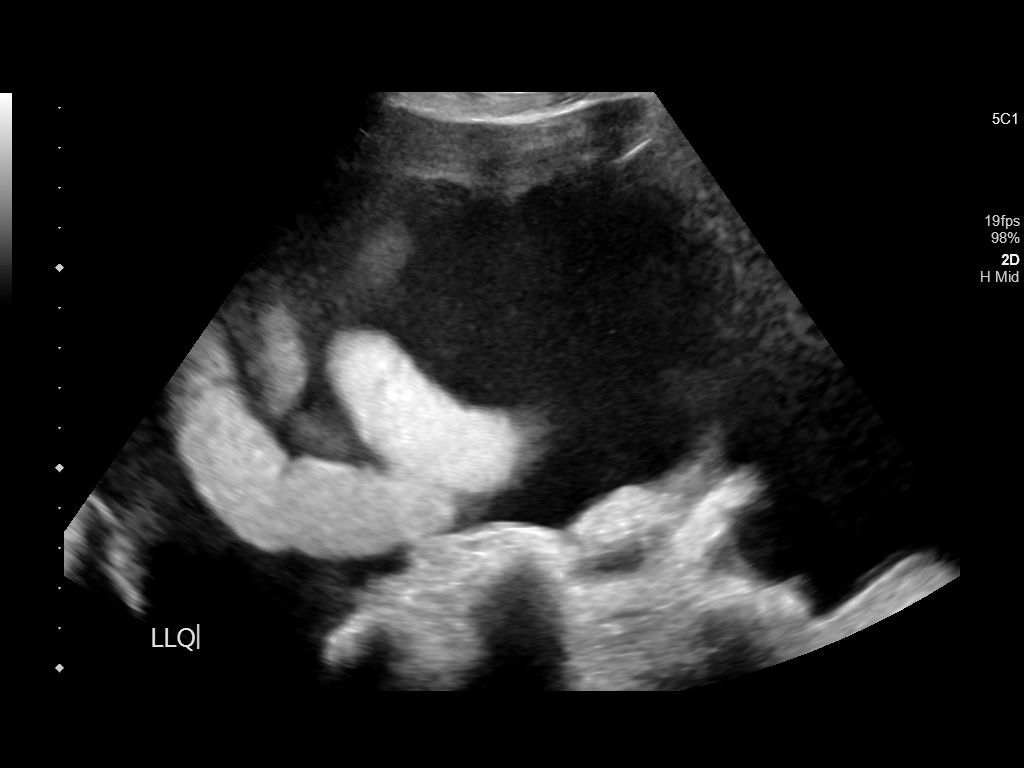
[im 4/5]
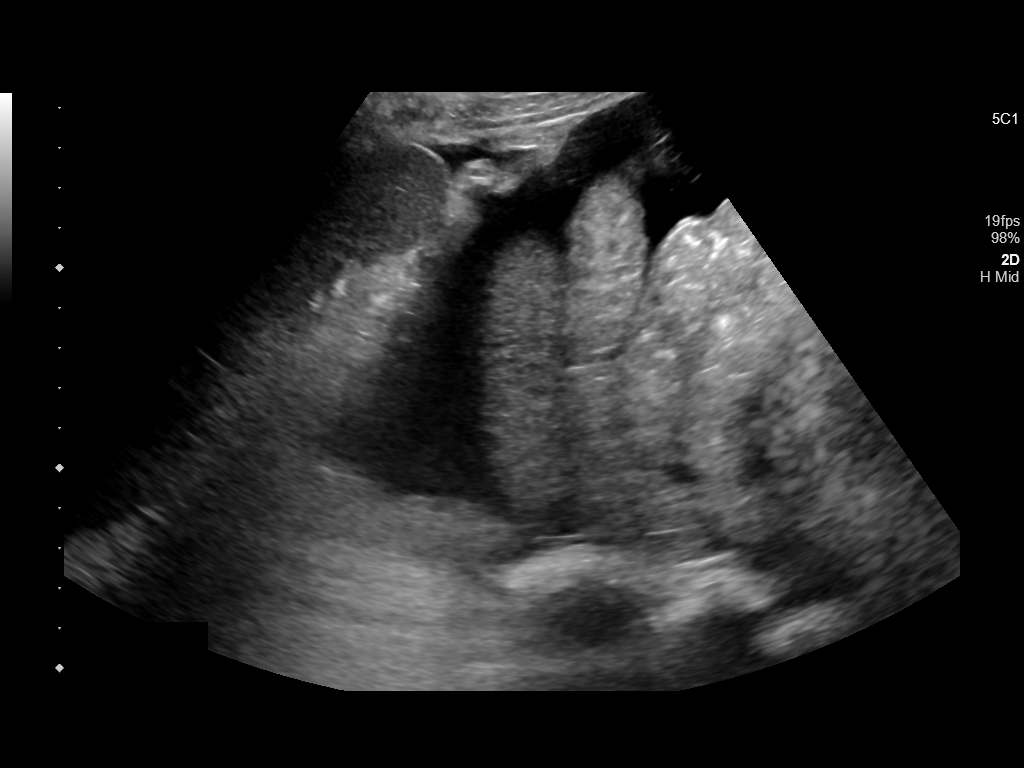
[im 5/5]
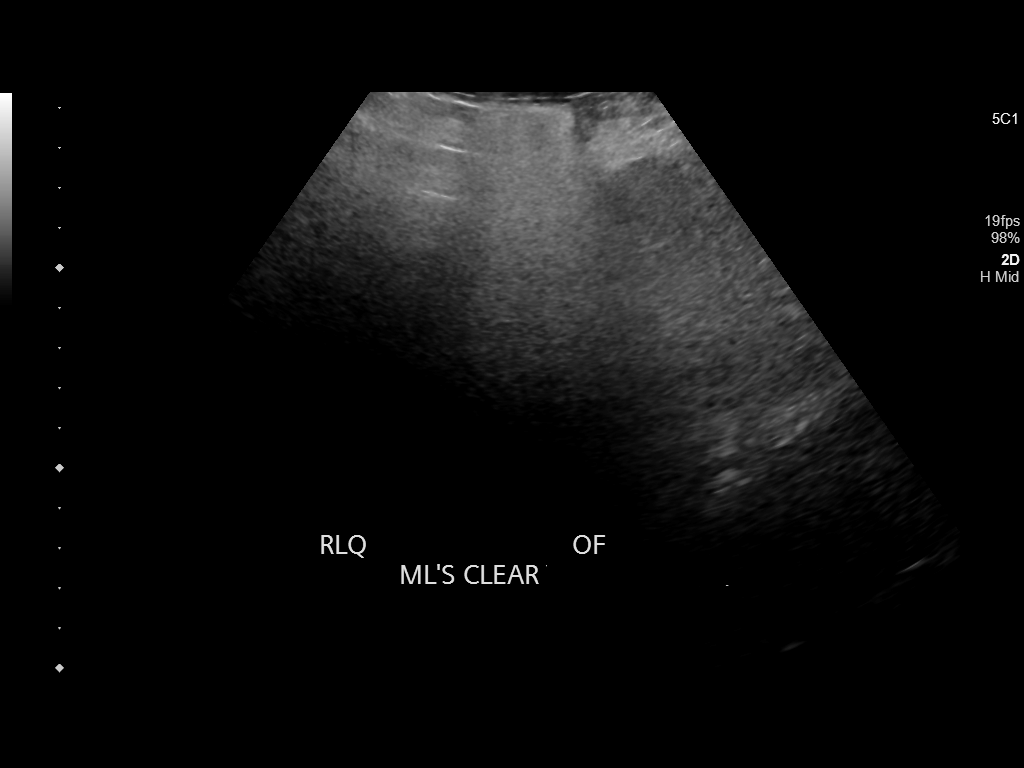

[5 of 5 positions shown; findings below may reference images not displayed]

EXAM:
ULTRASOUND GUIDED  PARACENTESIS

MEDICATIONS:
None.

COMPLICATIONS:
None immediate.

PROCEDURE:
Informed written consent was obtained from the patient after a
discussion of the risks, benefits and alternatives to treatment. A
timeout was performed prior to the initiation of the procedure.

Initial ultrasound scanning demonstrates a large amount of ascites
within the right lower abdominal quadrant. The right lower abdomen
was prepped and draped in the usual sterile fashion. 1% lidocaine
with epinephrine was used for local anesthesia.

Following this, a 6 Fr Safe-T-Centesis catheter was introduced. An
ultrasound image was saved for documentation purposes. The
paracentesis was performed. The catheter was removed and a dressing
was applied. The patient tolerated the procedure well without
immediate post procedural complication.
FINDINGS: A total of approximately 1977 mL of straw-colored ascitic fluid was
removed.
IMPRESSION: Successful ultrasound-guided paracentesis yielding 6.3 liters of
peritoneal fluid.

## 2020-06-13 IMAGING — DX DG CHEST 1V
1 series · 1 of 1 positions shown · non-contrast
Comparison: Eighteen 19

CLINICAL DATA: Shortness of breath

EXAM:
CHEST  1 VIEW

[chest ap]
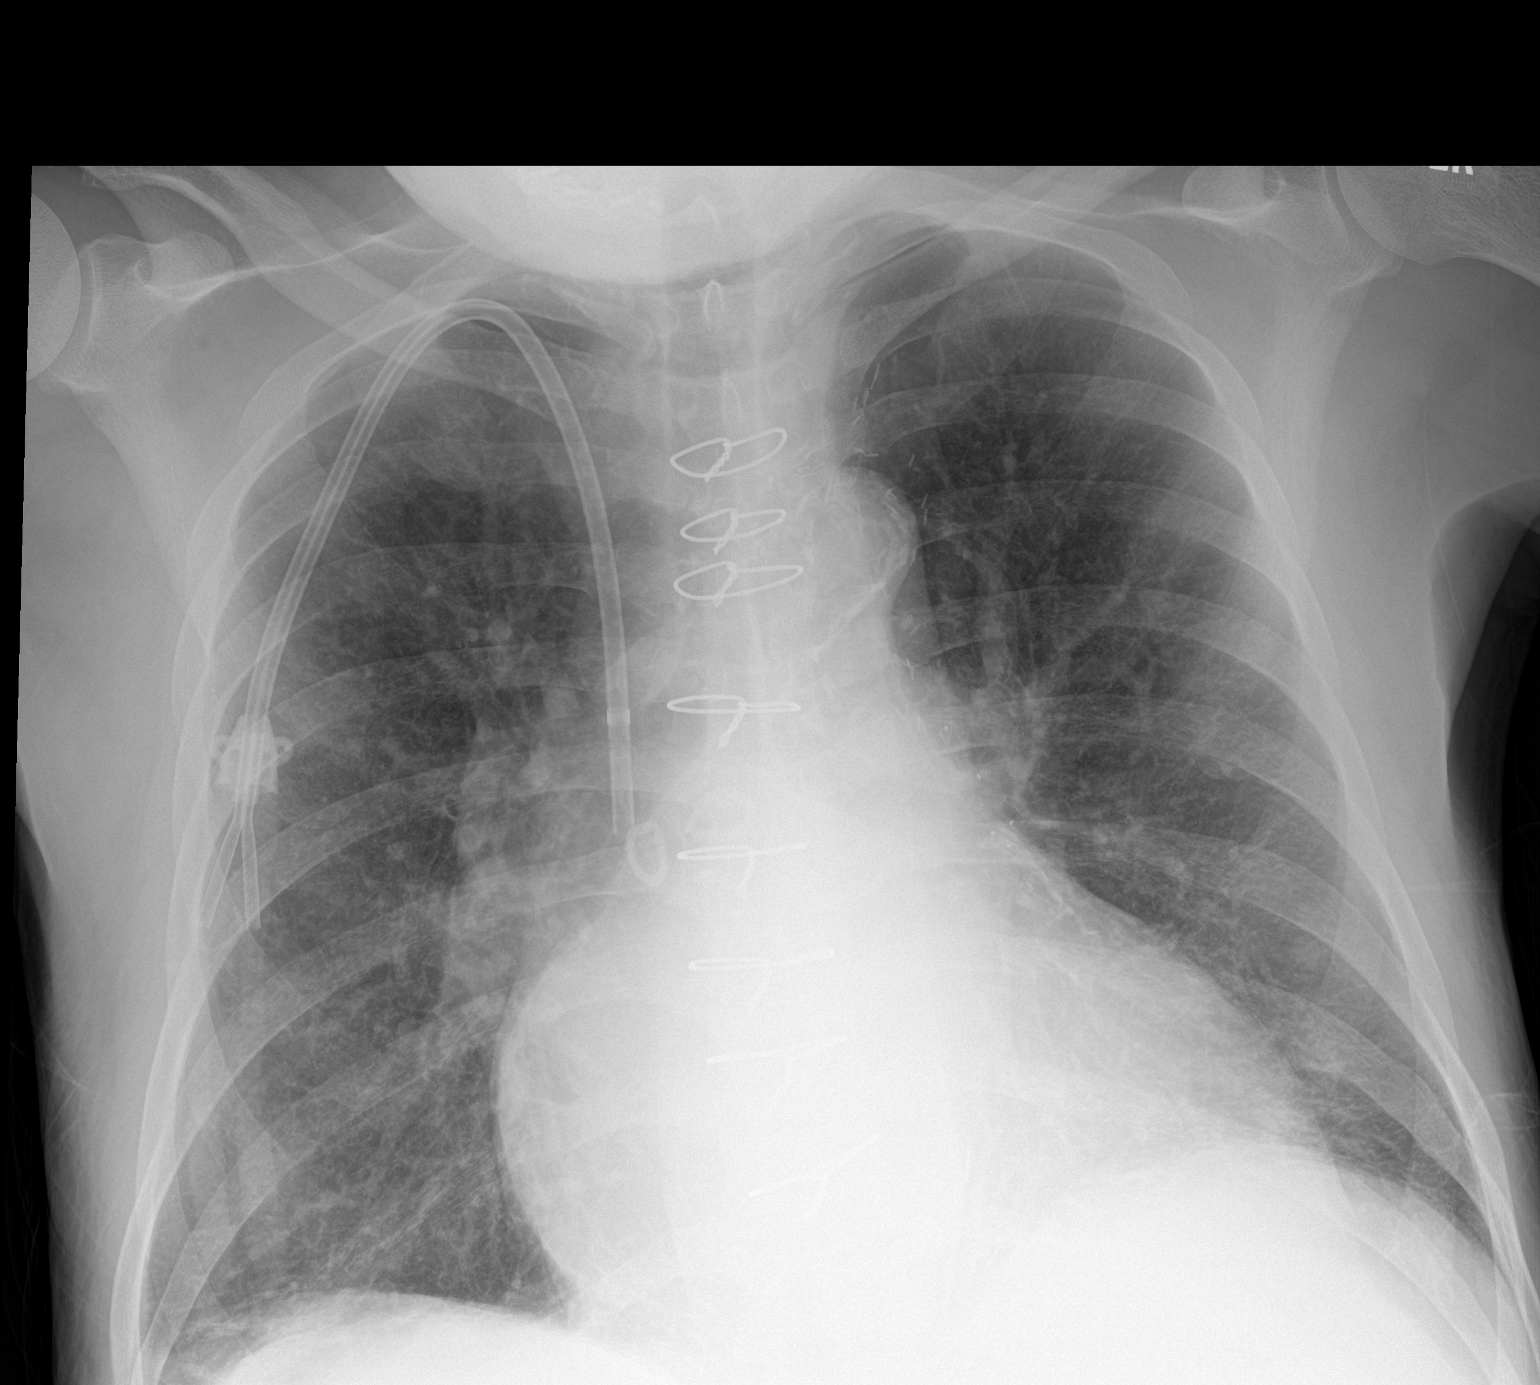

[1 of 1 positions shown; findings below may reference images not displayed]

FINDINGS: Unchanged position of right chest wall hemodialysis catheter. Remote
CABG. Moderate cardiomegaly is unchanged. There is bibasilar
atelectasis. No focal airspace consolidation or pulmonary edema.
IMPRESSION: 1. Bibasilar atelectasis without focal consolidation.
2. Unchanged cardiomegaly with aortic atherosclerosis ([id number]).

## 2020-06-14 IMAGING — US US PARACENTESIS
1 series · 3 of 3 positions shown · non-contrast
Comparison: none

INDICATION: Recurrent ascites, abdominal distension, discomfort

[Series 1: us paracentesis · 3 of 3 slices shown]
[im 1/3]
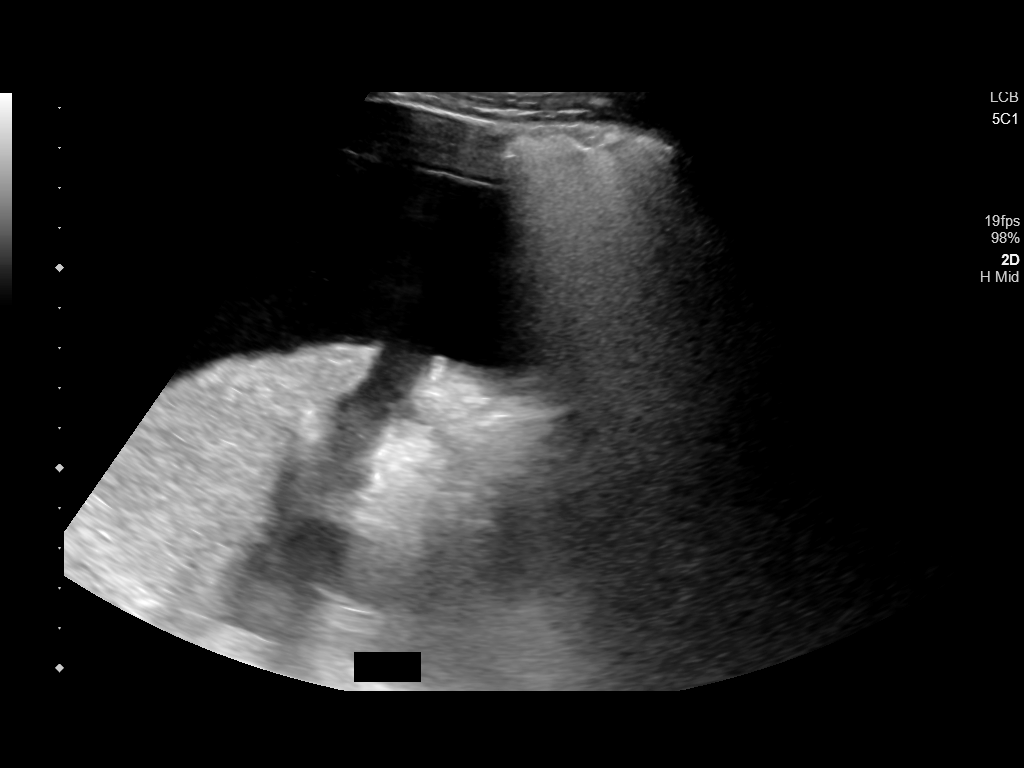
[im 2/3]
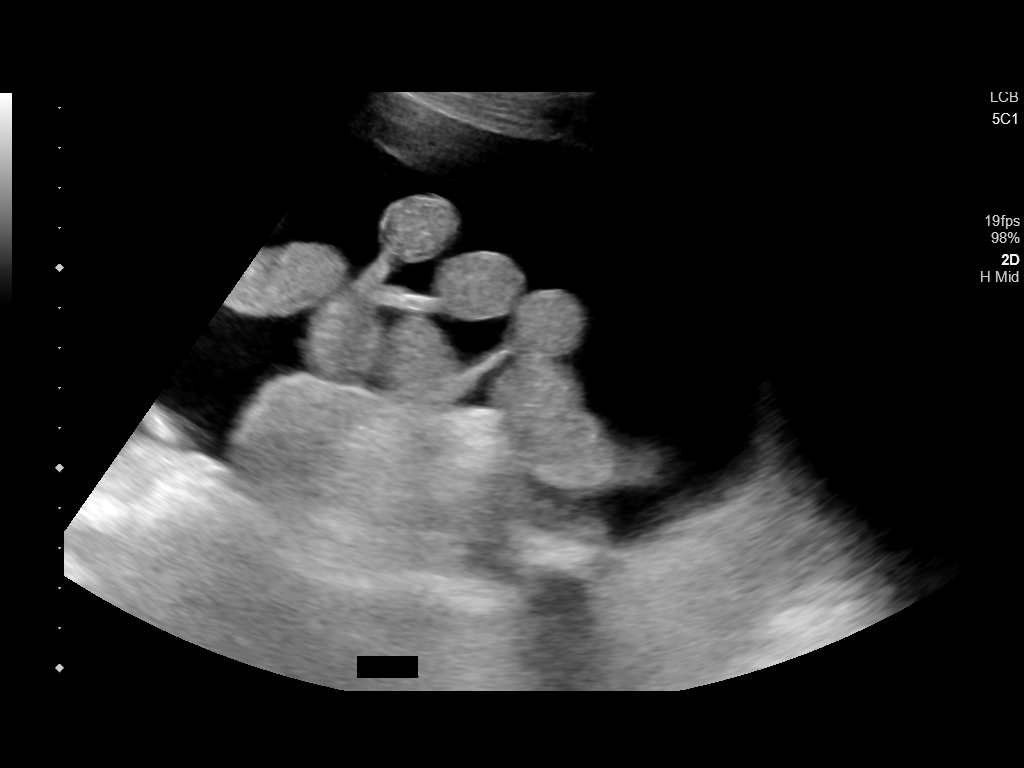
[im 3/3]
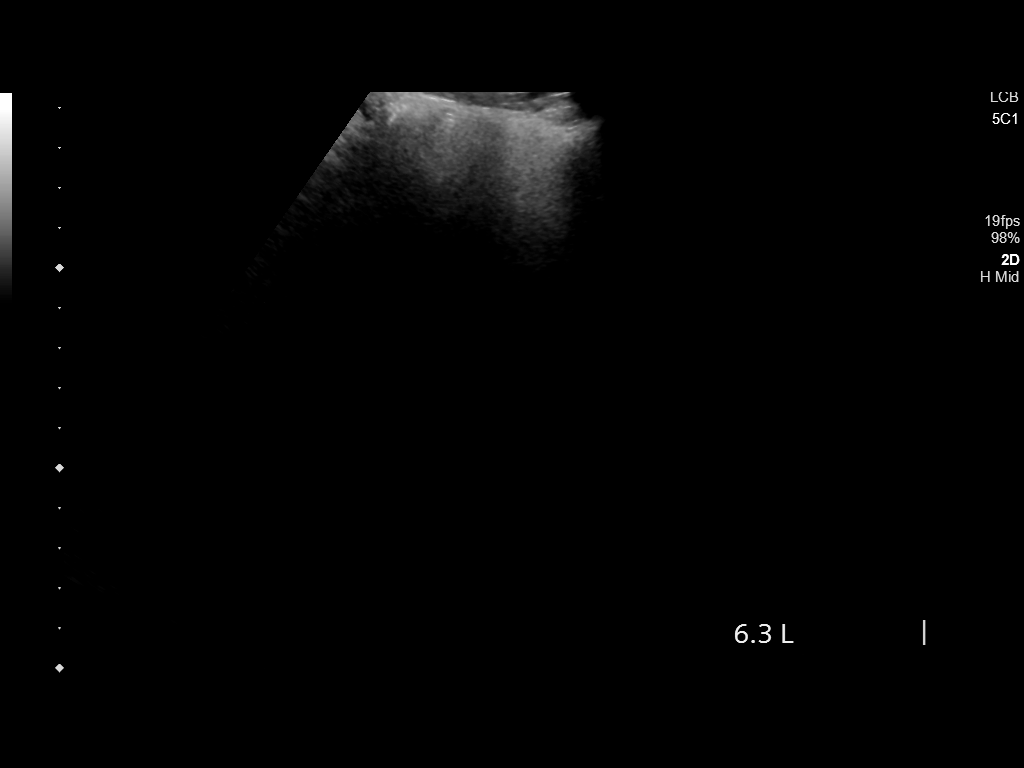

[3 of 3 positions shown; findings below may reference images not displayed]

EXAM:
ULTRASOUND GUIDED RIGHT PARACENTESIS

MEDICATIONS:
1% lidocaine local

COMPLICATIONS:
None immediate.

PROCEDURE:
Informed written consent was obtained from the patient after a
discussion of the risks, benefits and alternatives to treatment. A
timeout was performed prior to the initiation of the procedure.

Initial ultrasound scanning demonstrates a large amount of ascites
within the right lower abdominal quadrant. The right lower abdomen
was prepped and draped in the usual sterile fashion. 1% lidocaine
with epinephrine was used for local anesthesia.

Following this, a 6 Fr Safe-T-Centesis catheter was introduced. An
ultrasound image was saved for documentation purposes. The
paracentesis was performed. The catheter was removed and a dressing
was applied. The patient tolerated the procedure well without
immediate post procedural complication.
FINDINGS: A total of approximately 6.3 L of clear peritoneal fluid was
removed. Sample was not sent for laboratory analysis
IMPRESSION: Successful ultrasound-guided paracentesis yielding 6.3 liters of
peritoneal fluid.

## 2020-06-21 IMAGING — DX DG FOOT COMPLETE 3+V*L*
3 series · 3 of 3 positions shown · non-contrast
Comparison: 10/29/2016

CLINICAL DATA: Thickening and numbness of left foot.

EXAM:
LEFT FOOT - COMPLETE 3+ VIEW

[foot ap]
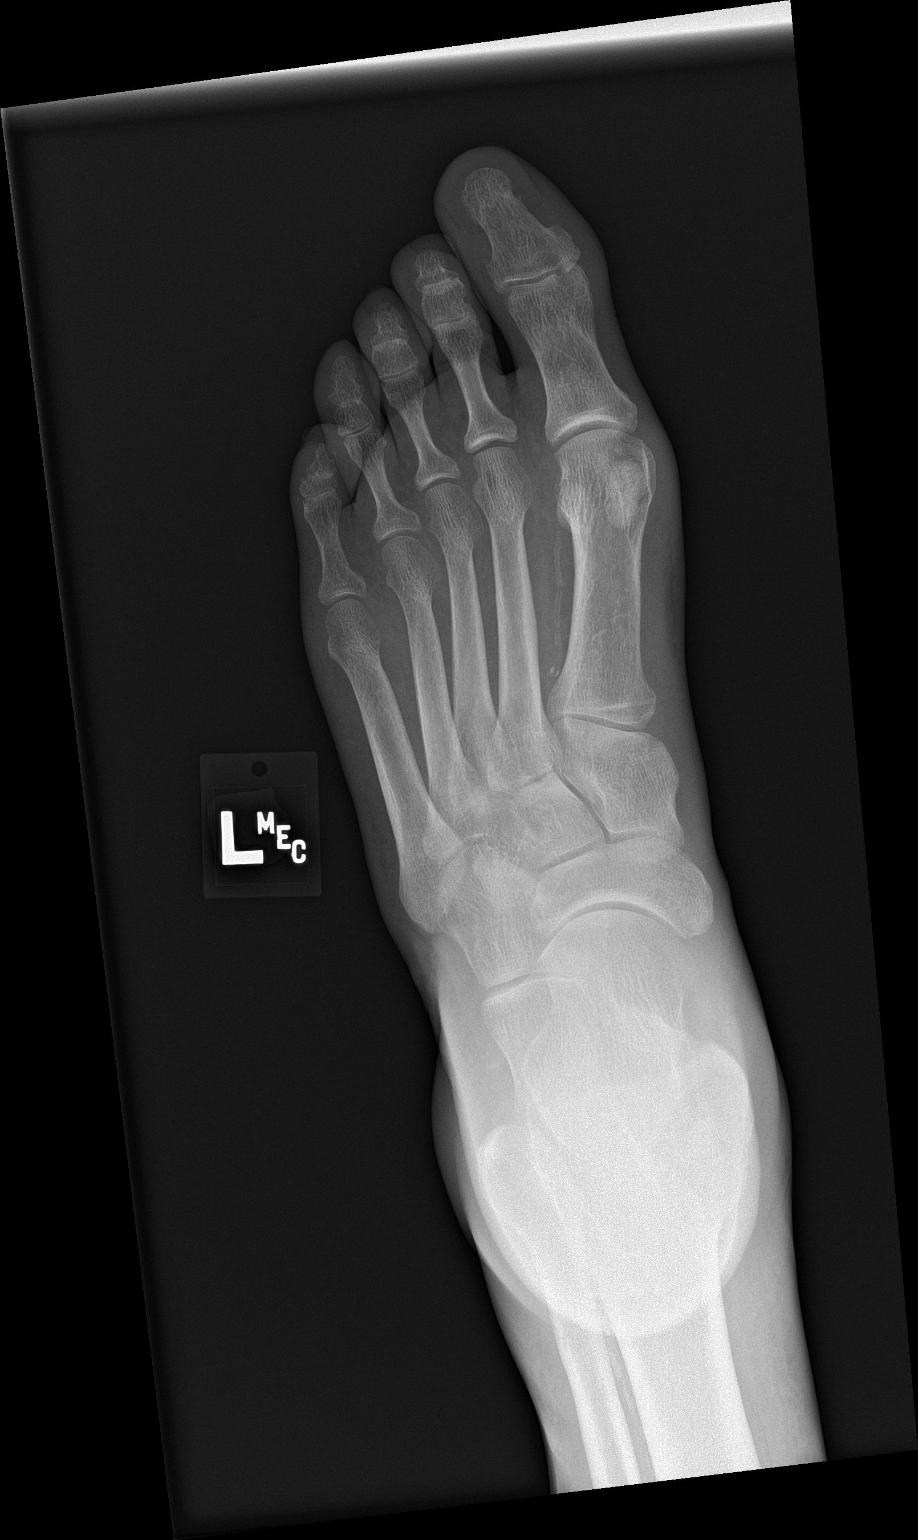

[foot obl]
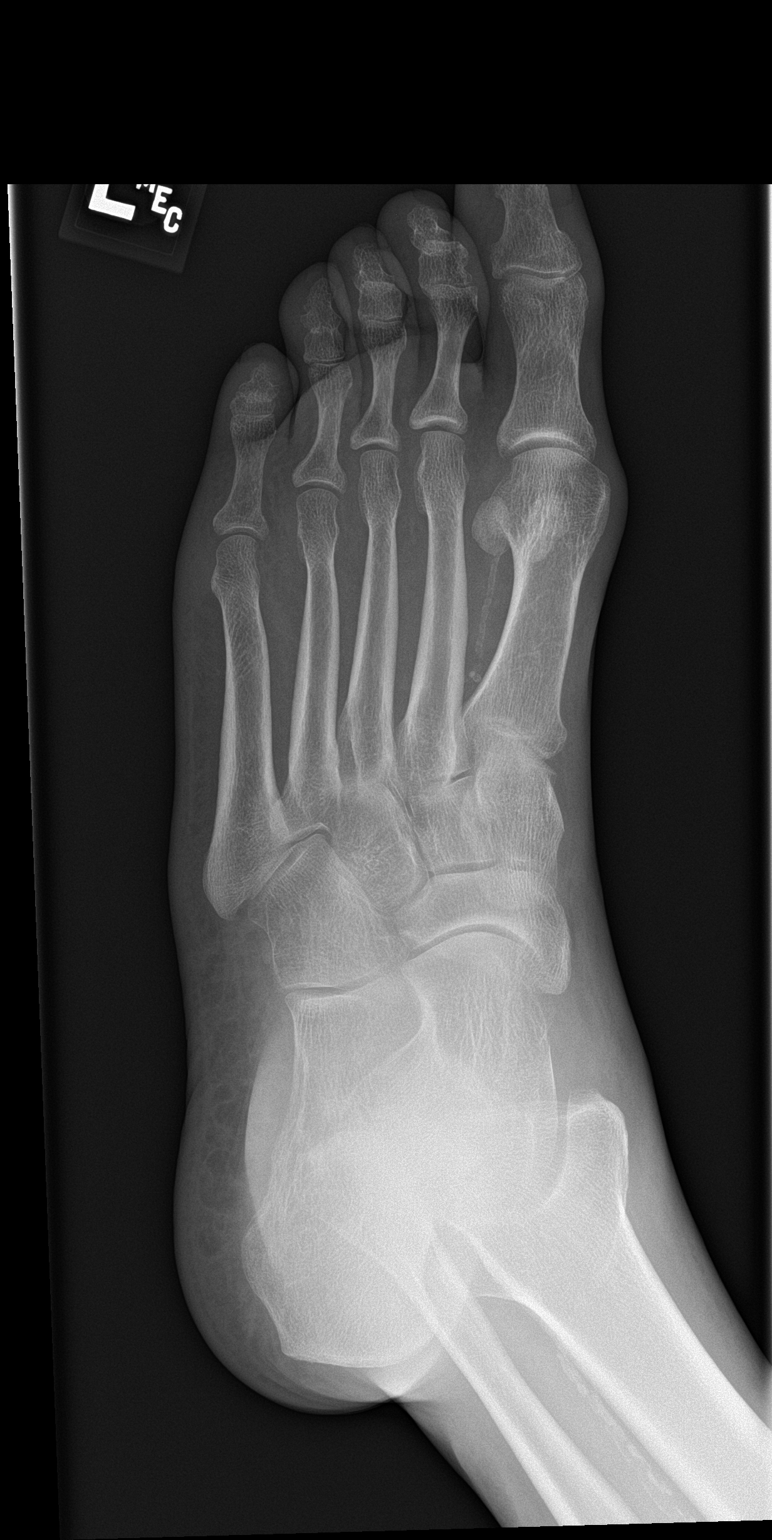

[foot lat]
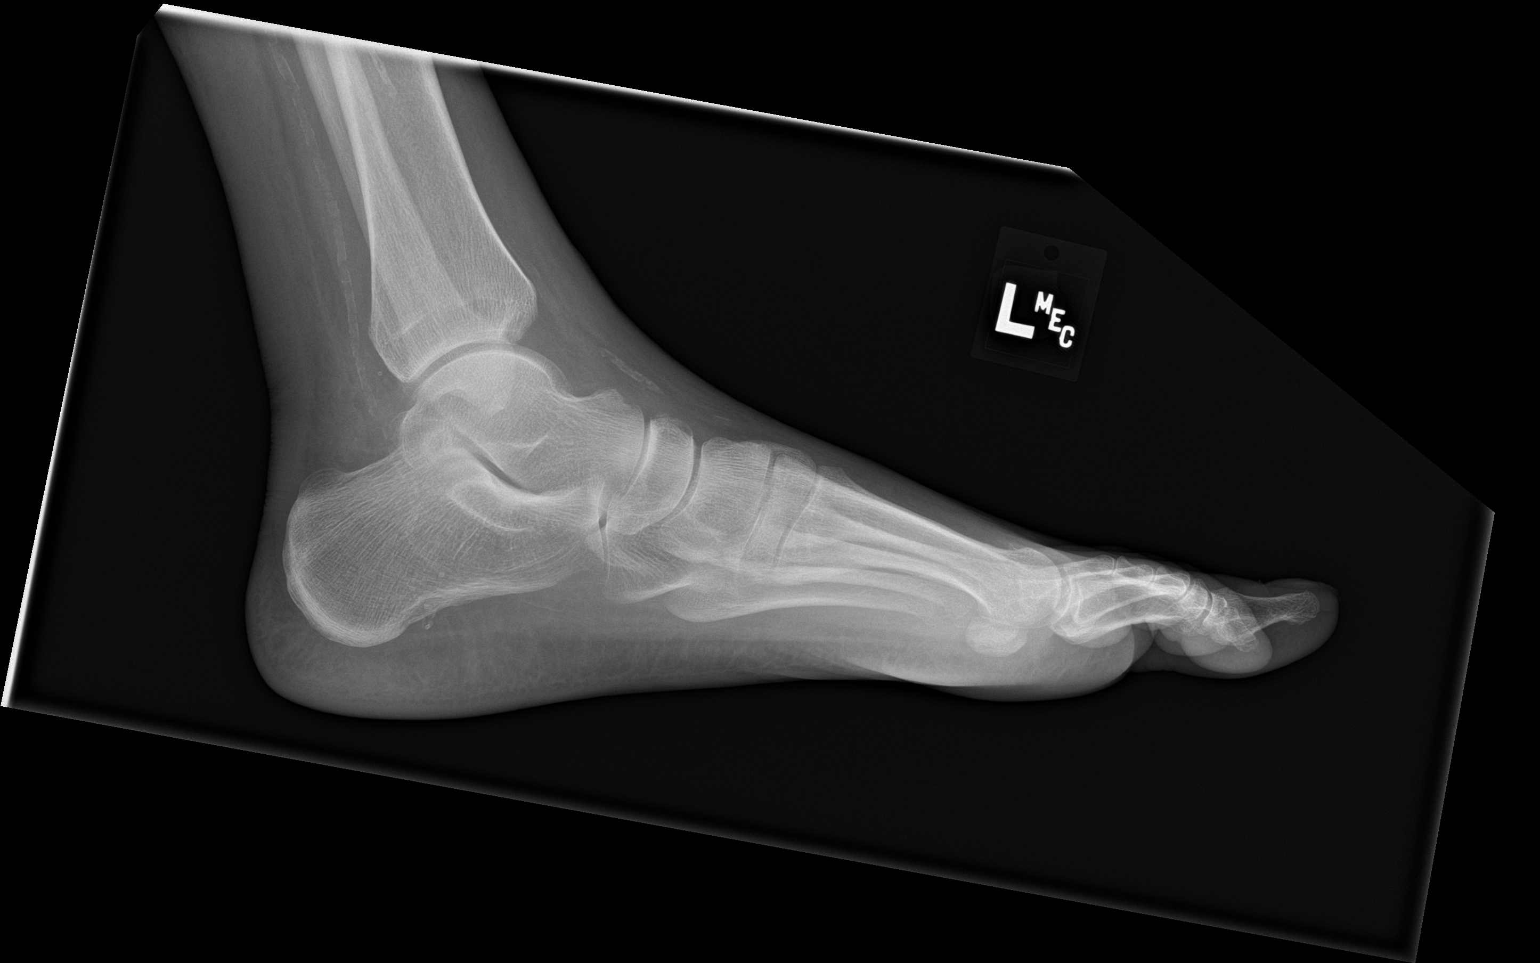

[3 of 3 positions shown; findings below may reference images not displayed]

FINDINGS: There is no evidence of fracture or dislocation. There is no
evidence of arthropathy or other focal bone abnormality.

Vascular calcifications noted.
IMPRESSION: No acute fracture or dislocation identified about the left foot.

## 2020-06-21 IMAGING — CR DG CHEST 2V
2 series · 2 of 2 positions shown · non-contrast
Comparison: 11/10/2017 chest radiograph.

CLINICAL DATA: Leg swelling

EXAM:
CHEST - 2 VIEW

[chest pa]
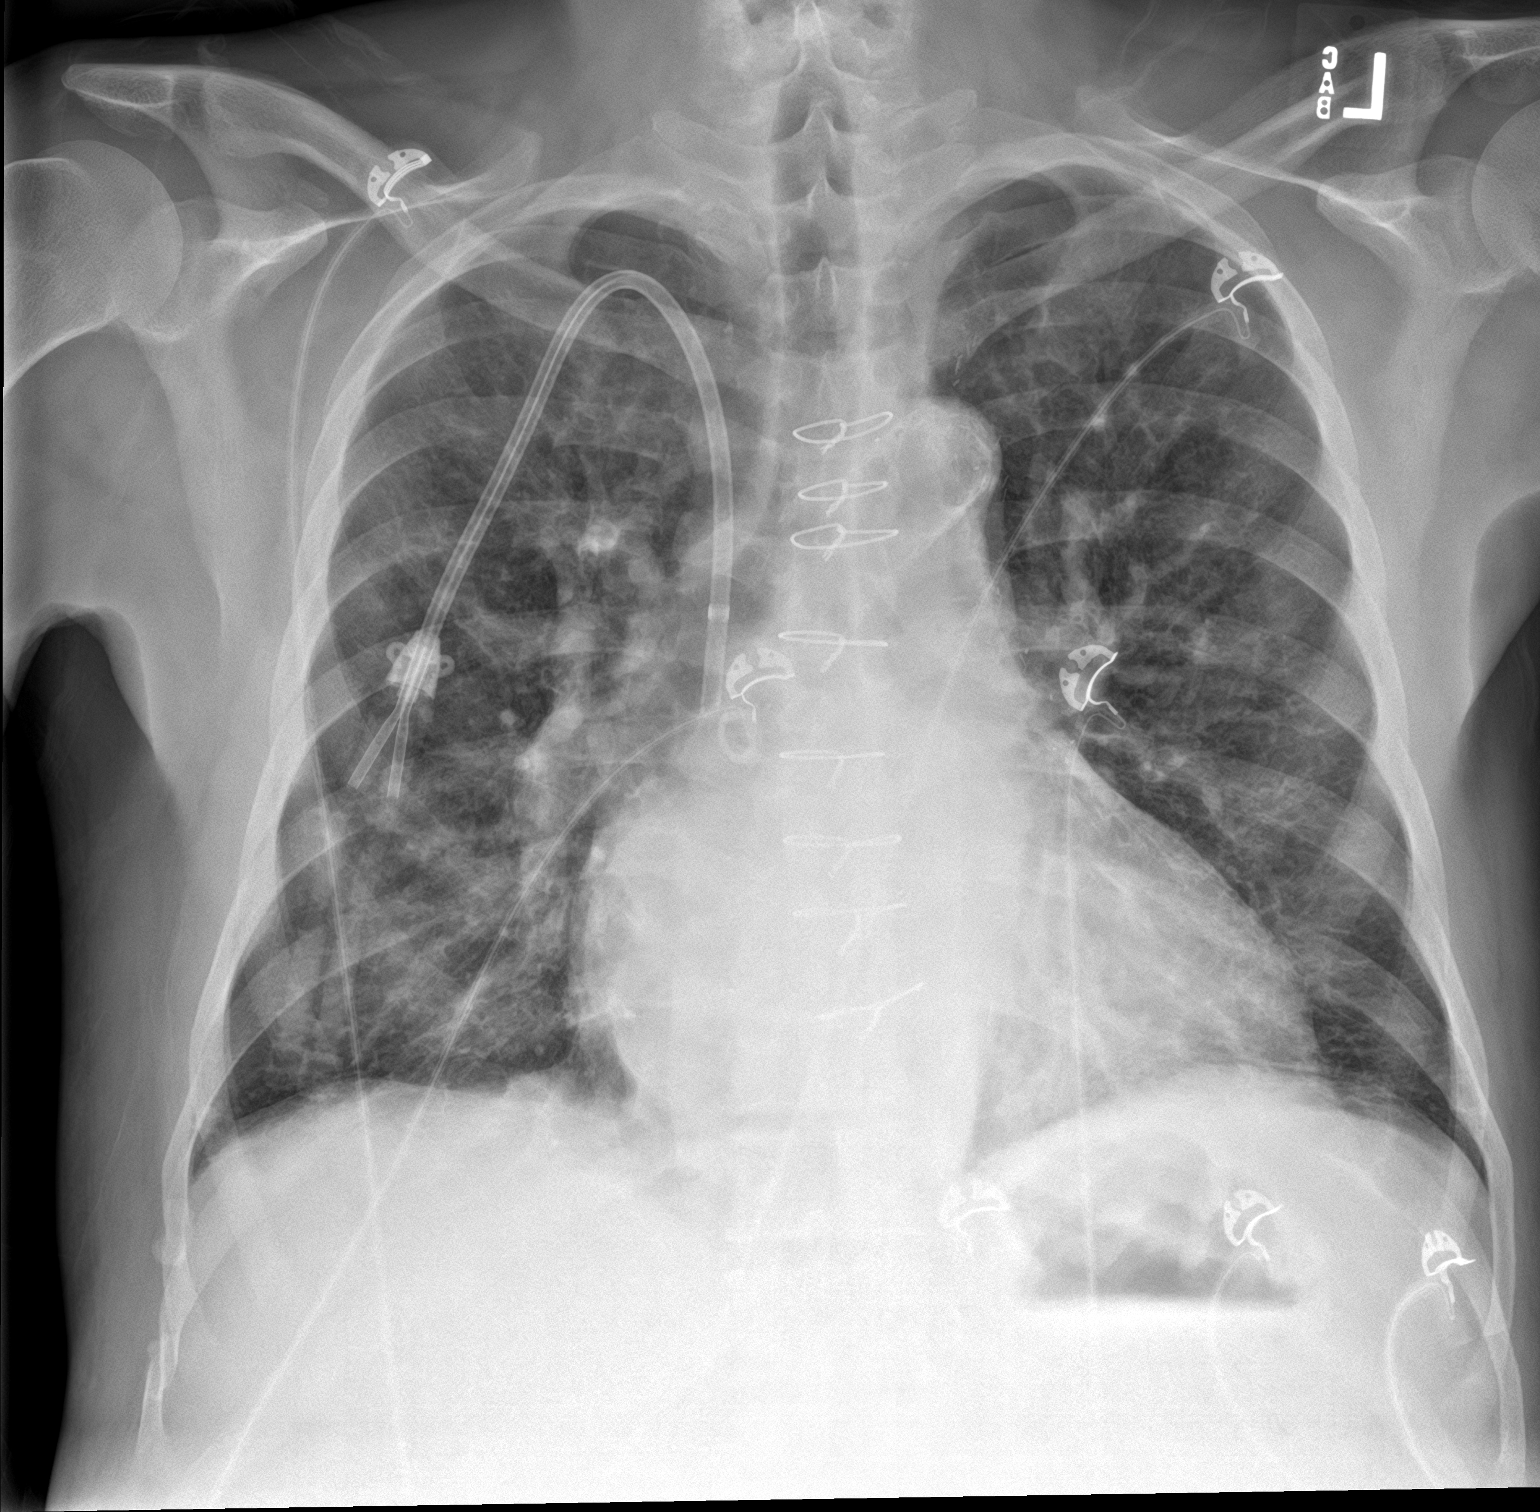

[chest lat]
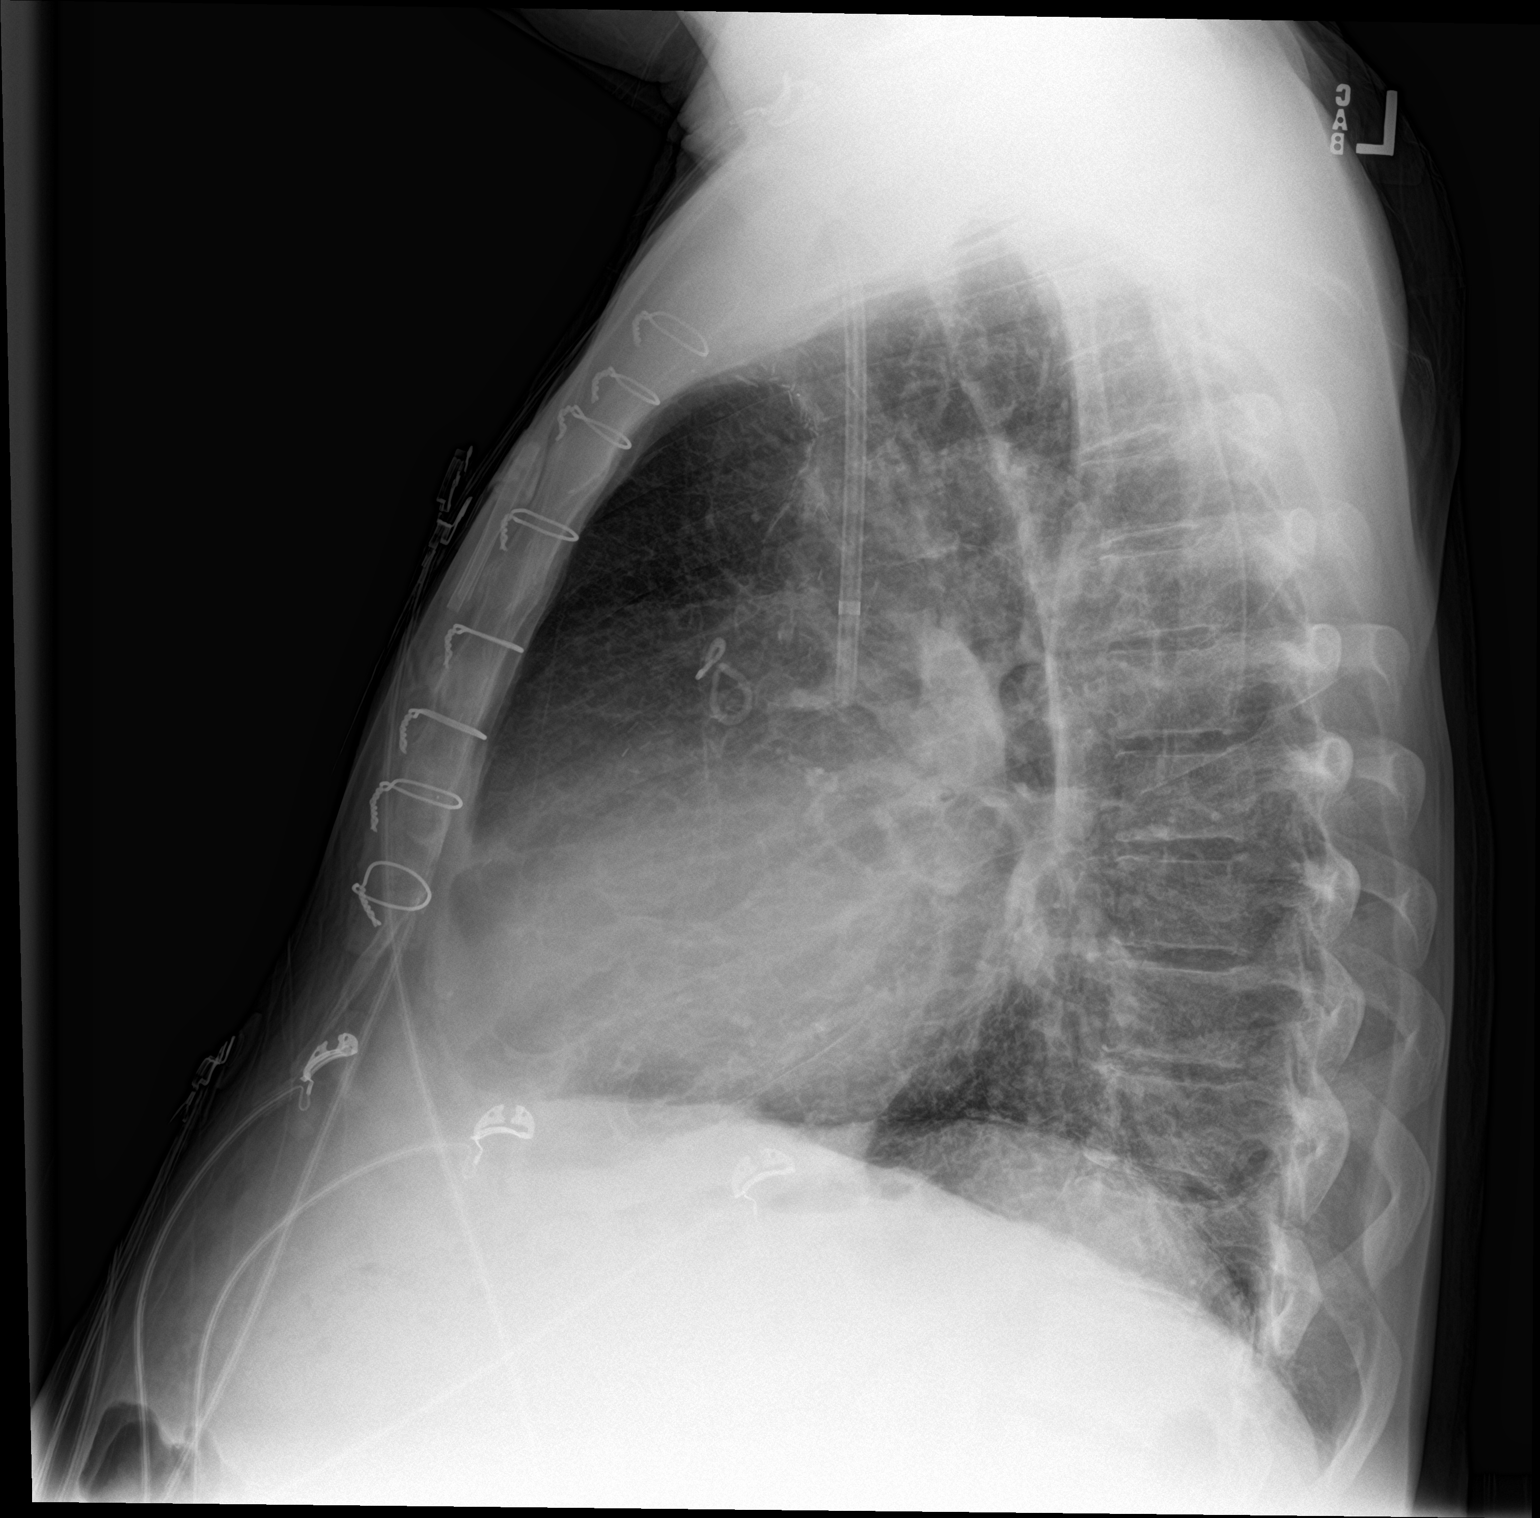

[2 of 2 positions shown; findings below may reference images not displayed]

FINDINGS: Right internal jugular central venous catheter terminates in the
lower third of the SVC. Intact sternotomy wires. CABG clips overlie
the mediastinum. Stable cardiomediastinal silhouette with mild
cardiomegaly. No pneumothorax. No pleural effusion. No overt
pulmonary edema. No acute consolidative airspace disease. Mild
bibasilar scarring versus atelectasis.
IMPRESSION: 1. Stable cardiomegaly without overt pulmonary edema.
2. Mild bibasilar scarring versus atelectasis.

## 2020-06-30 IMAGING — US US PARACENTESIS
1 series · 9 of 9 positions shown · non-contrast
Comparison: none

CLINICAL DATA: Recurrent large volume abdominal ascites due to
alcoholic cirrhosis remove up to 5 L, no albumin ordered

EXAM:
ULTRASOUND GUIDED PARACENTESIS
TECHNIQUE: The procedure, risks (including but not limited to bleeding,
infection, organ damage ), benefits, and alternatives were explained
to the patient. Questions regarding the procedure were encouraged
and answered. The patient understands and consents to the procedure.
Survey ultrasound of the abdomen was performed and an appropriate
skin entry site in the left lower abdomen was selected. Skin site
was marked, prepped with chlorhexadine, draped in usual sterile
fashion, and infiltrated locally with 1% lidocaine. A
Safe-T-Centesis needle was advanced into the peritoneal space until
fluid could be aspirated. The sheath was advanced and the needle
removed. 5 L of clear amber ascites were aspirated.
The patient tolerated the procedure well.
COMPLICATIONS:
COMPLICATIONS
none

[Series 1: us paracentesis · 0.23mm/px · 9 of 9 slices shown]
[im 1/9]
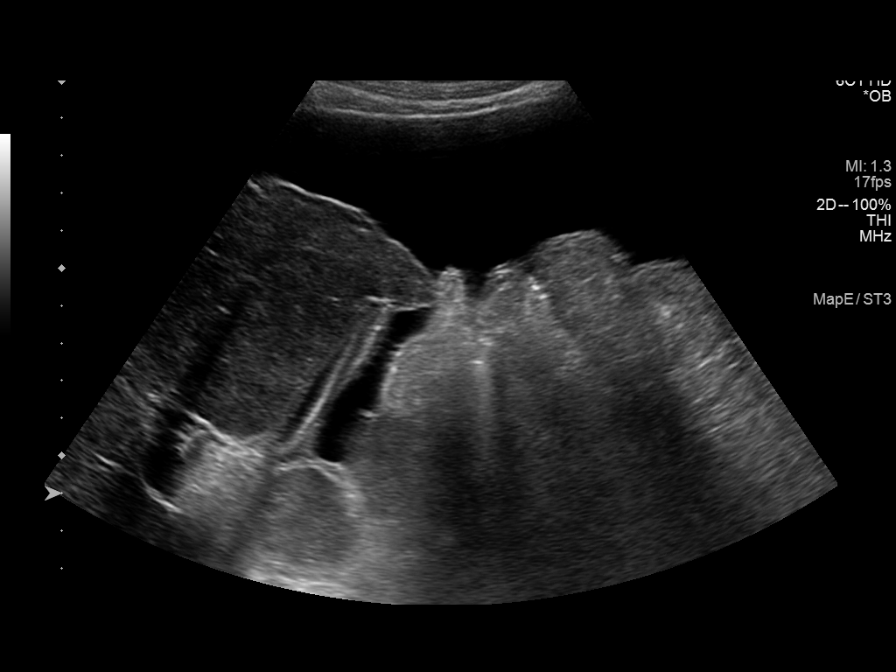
[im 2/9]
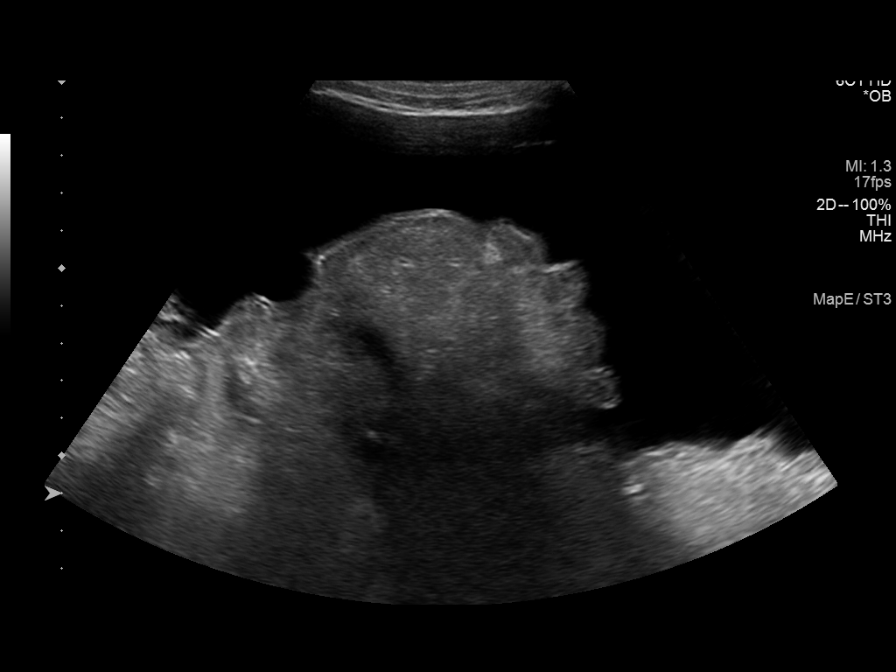
[im 3/9]
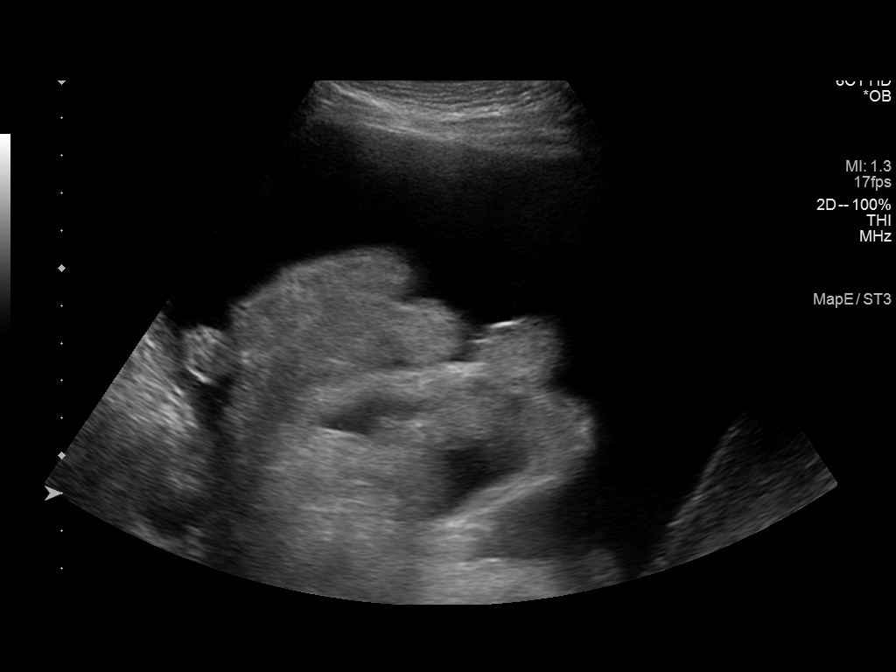
[im 4/9]
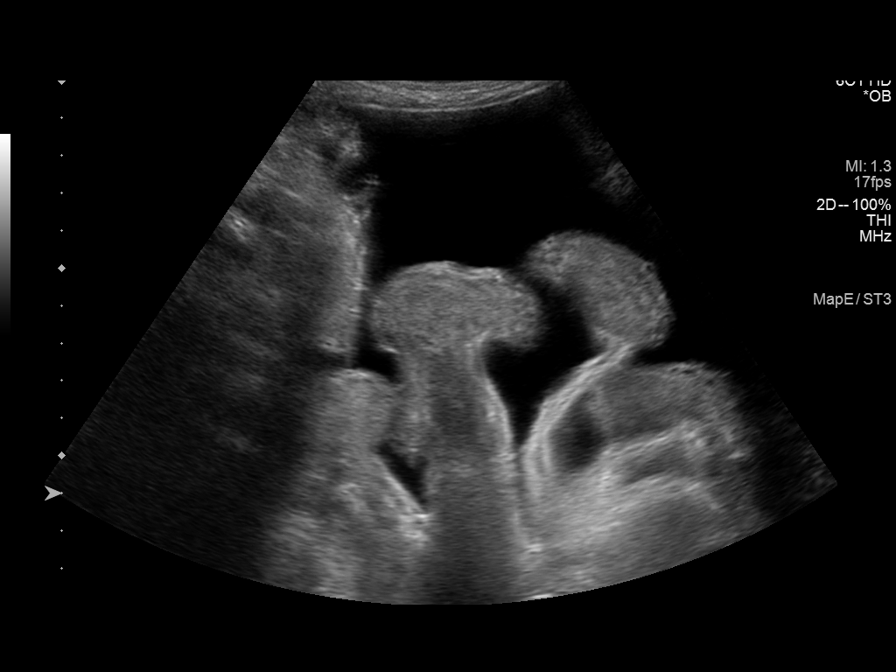
[im 5/9]
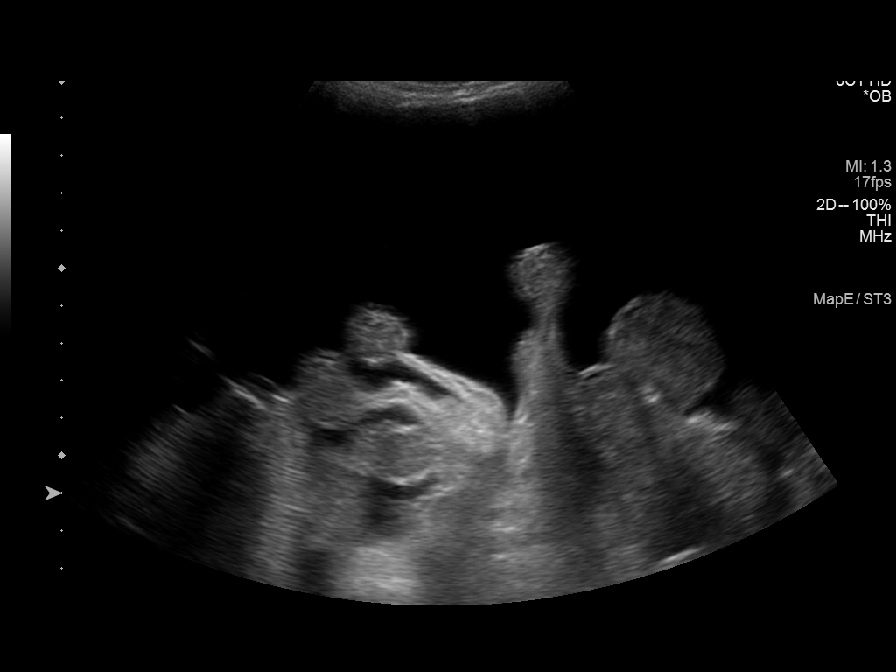
[im 6/9]
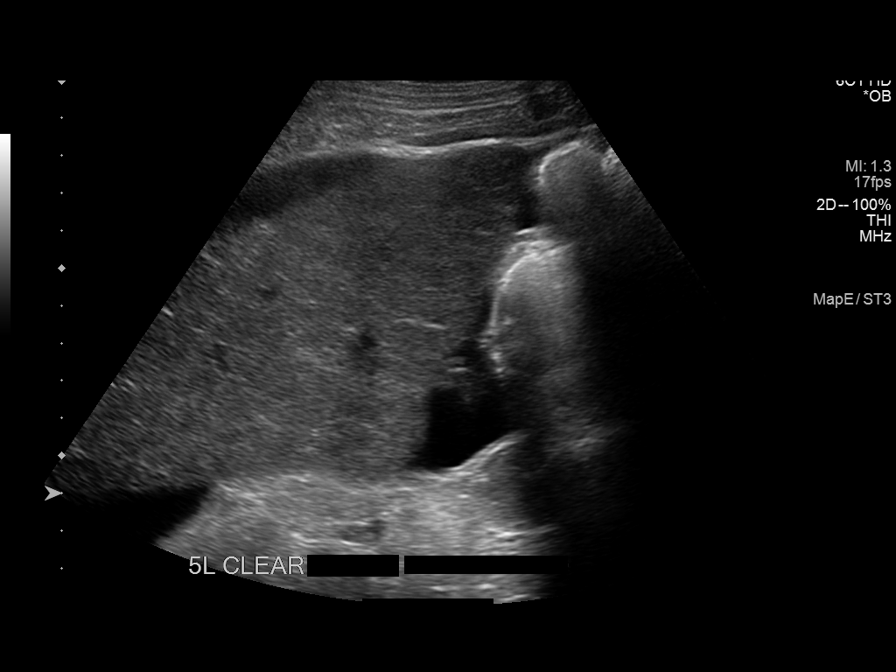
[im 7/9]
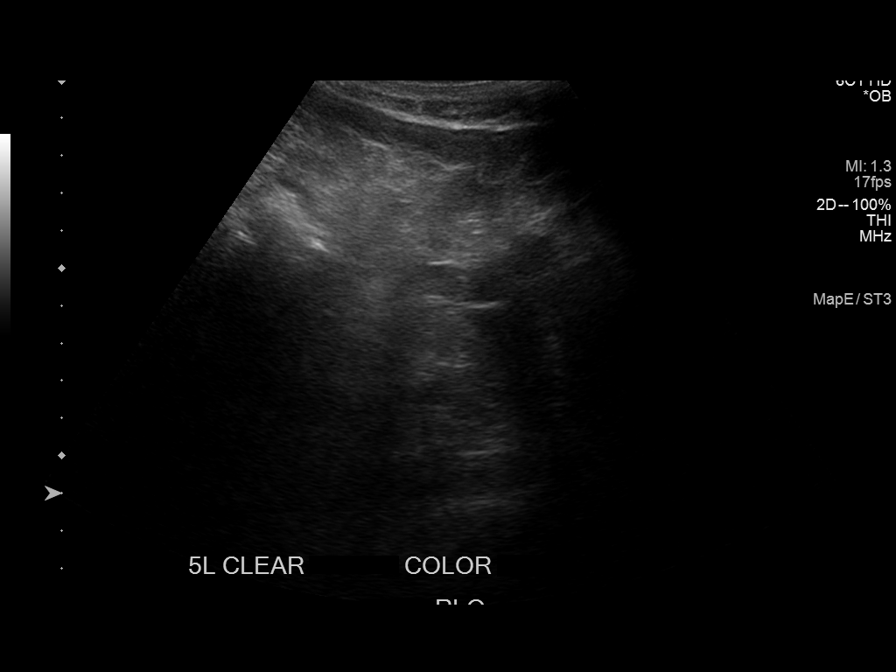
[im 8/9]
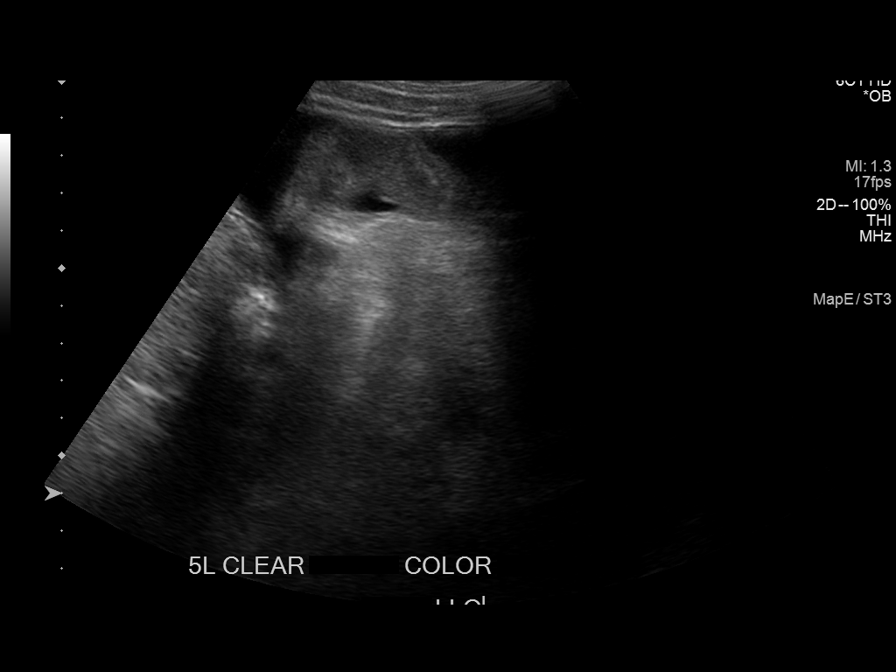
[im 9/9]
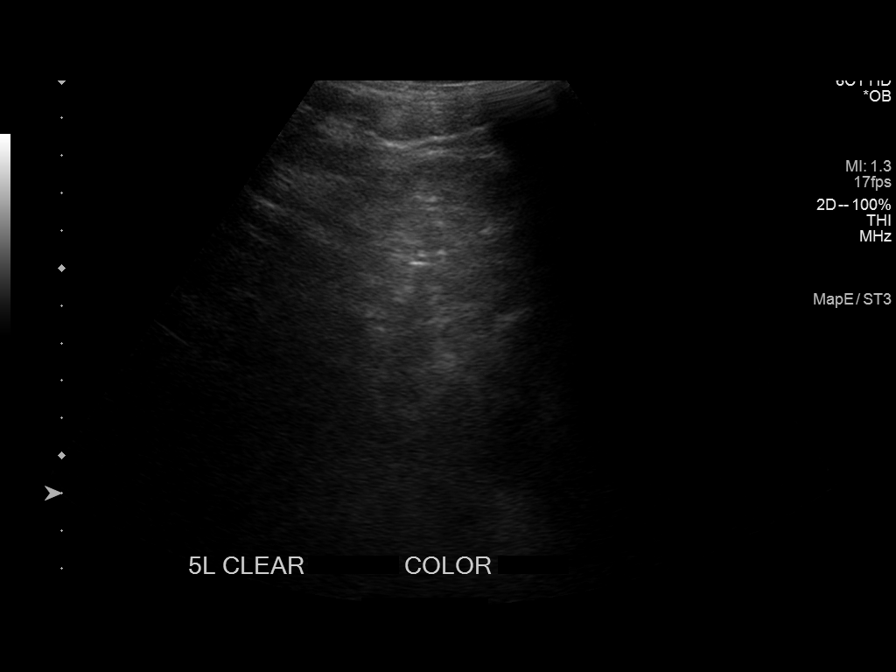

[9 of 9 positions shown; findings below may reference images not displayed]

IMPRESSION: Technically successful ultrasound guided paracentesis, removing 5 L
ascites.

## 2020-07-06 IMAGING — CR DG CHEST 2V
2 series · 2 of 2 positions shown · non-contrast
Comparison: 11/27/2017

CLINICAL DATA: Abdominal distention.  Bibasilar crackles

EXAM:
CHEST - 2 VIEW

[chest pa]
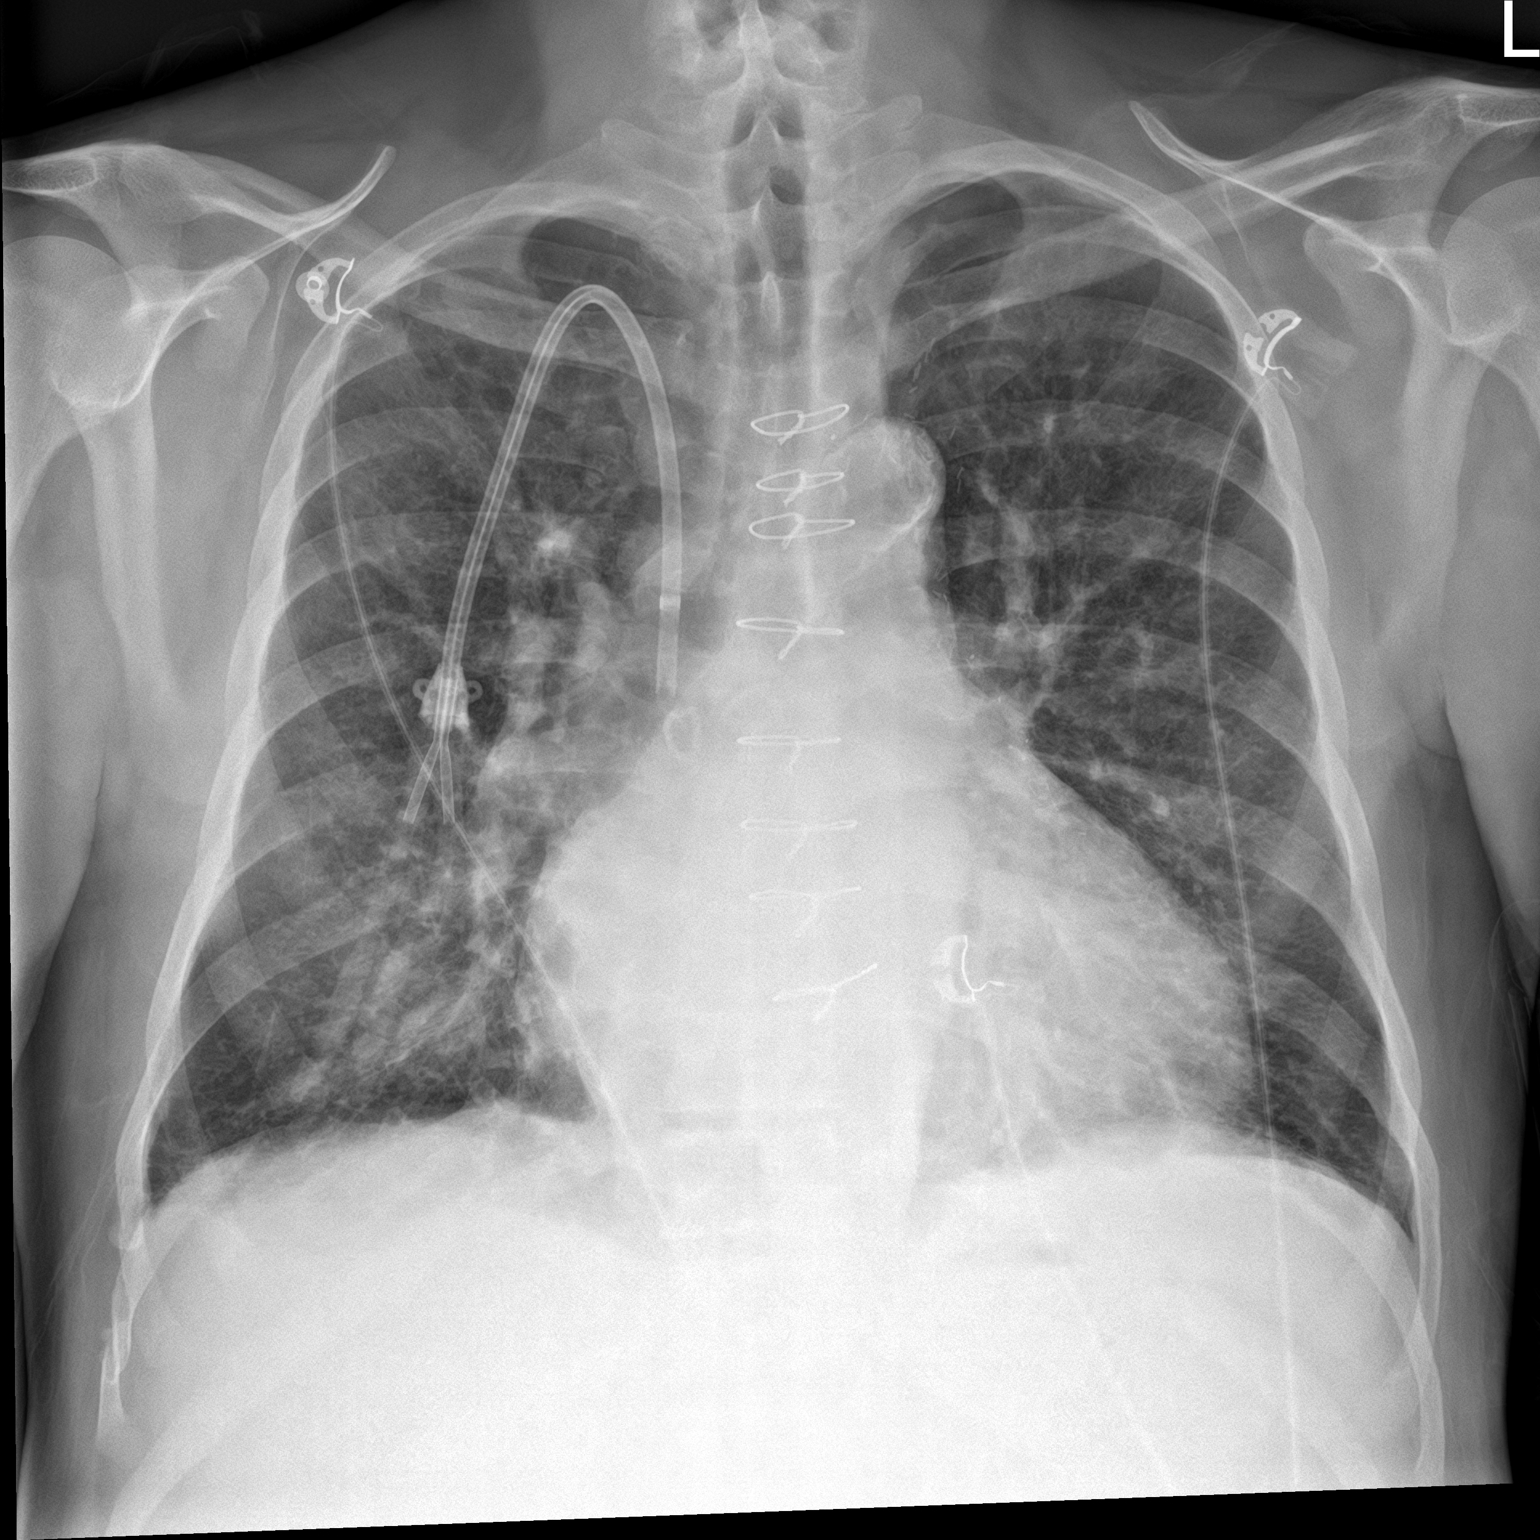

[chest lat]
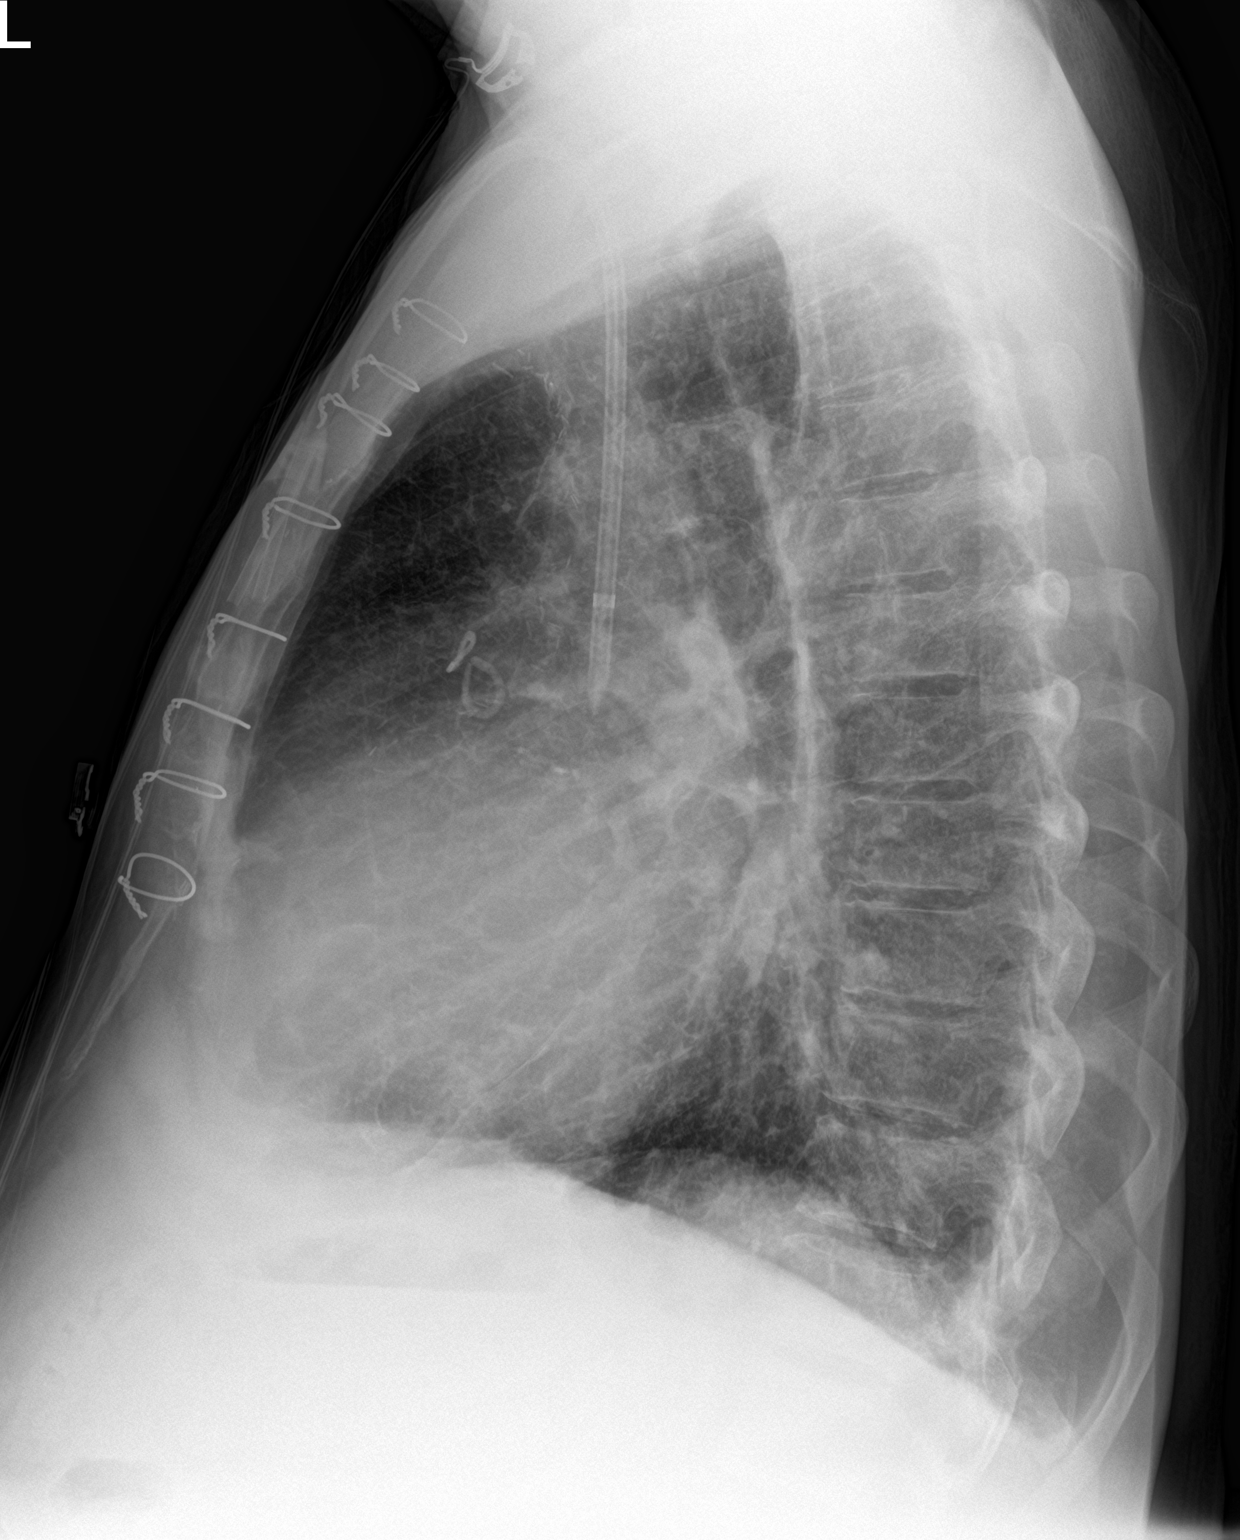

[2 of 2 positions shown; findings below may reference images not displayed]

FINDINGS: Right dialysis catheter remains in place, unchanged. Cardiomegaly.
Prior CABG. Vascular congestion and interstitial prominence
throughout the lungs. No effusions or acute bony abnormality.
Underlying COPD.
IMPRESSION: Cardiomegaly with vascular congestion and probable mild interstitial
edema.

COPD.

## 2020-07-09 IMAGING — CR DG CHEST 2V
2 series · 2 of 2 positions shown · non-contrast
Comparison: 12/03/2017

CLINICAL DATA: Dyspnea x2 nights without chest pain.

EXAM:
CHEST - 2 VIEW

[chest pa]
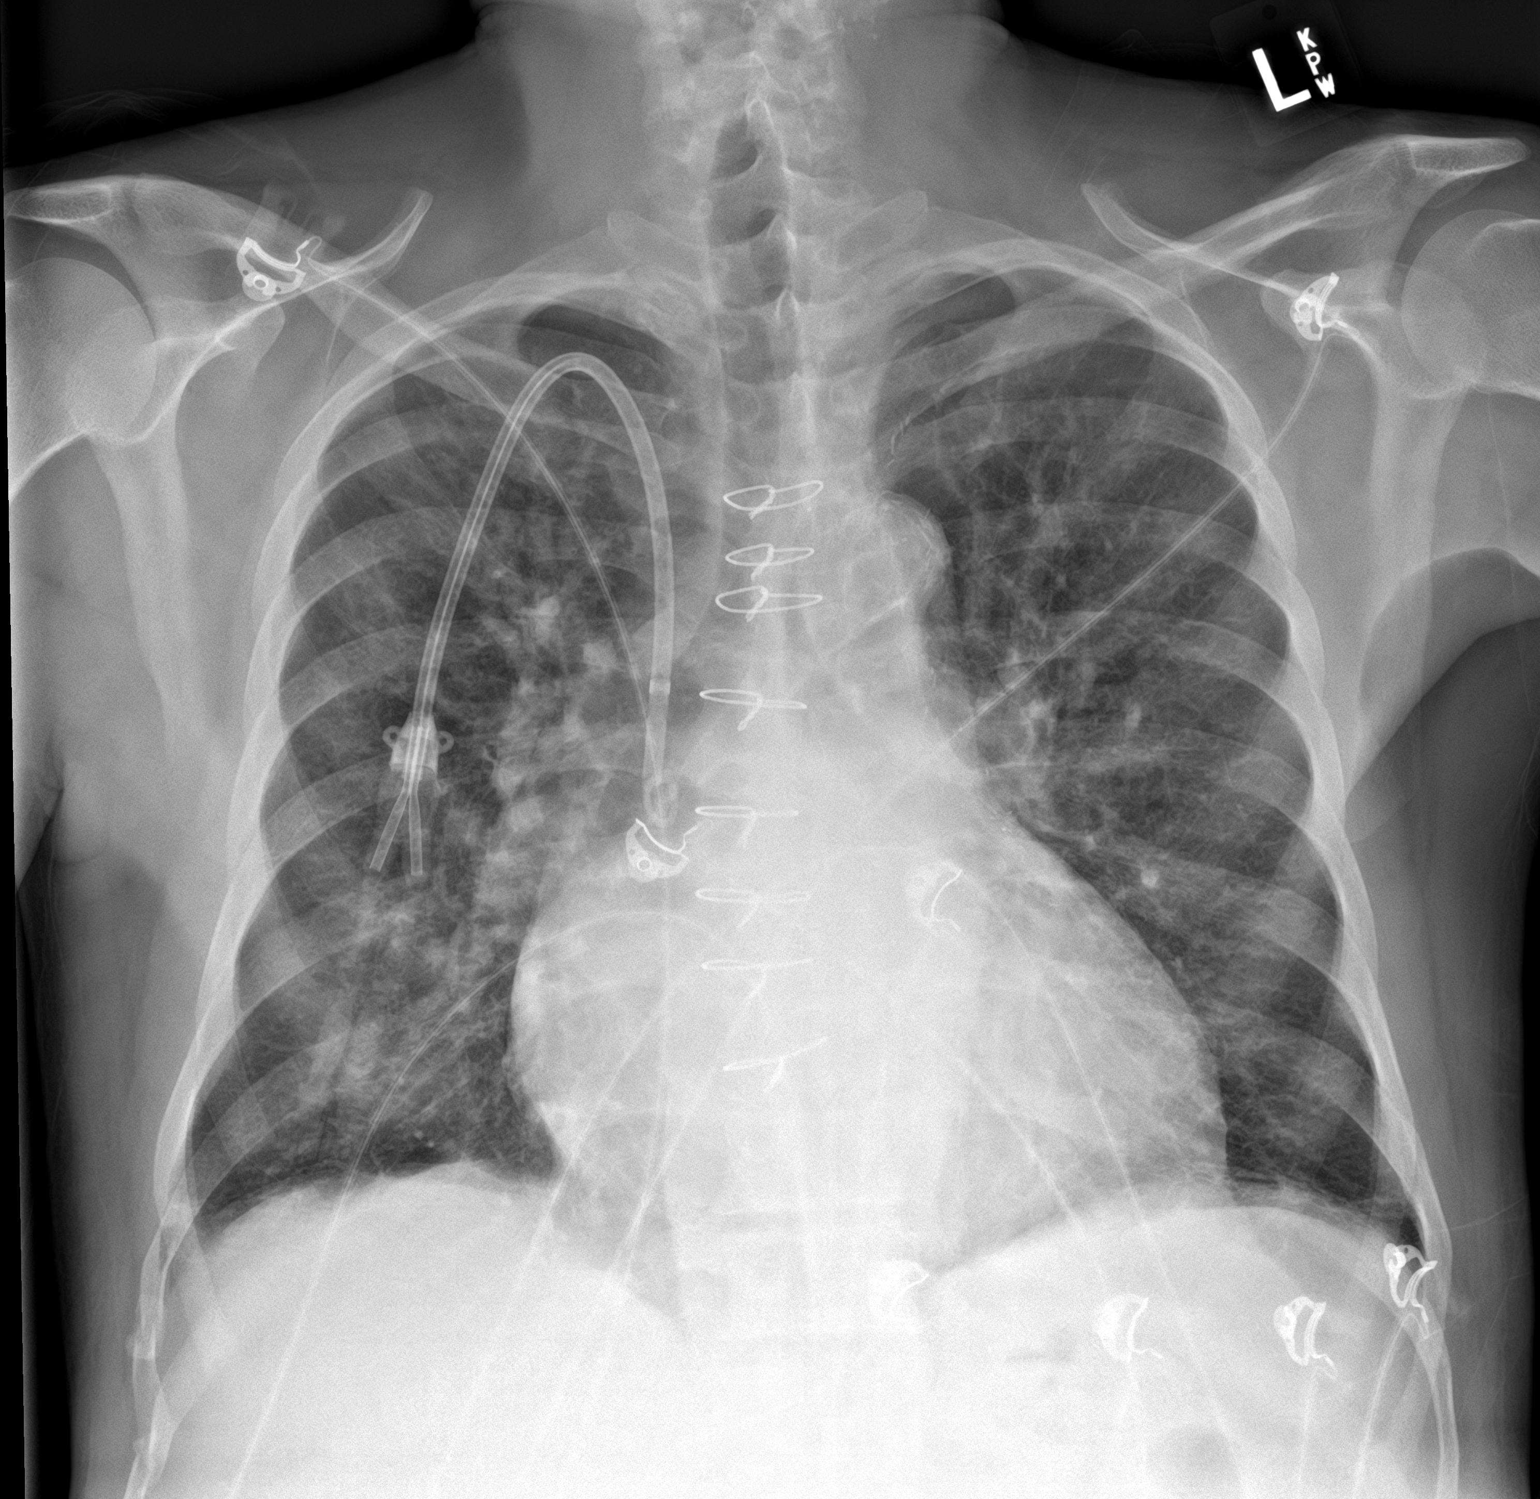

[chest lat]
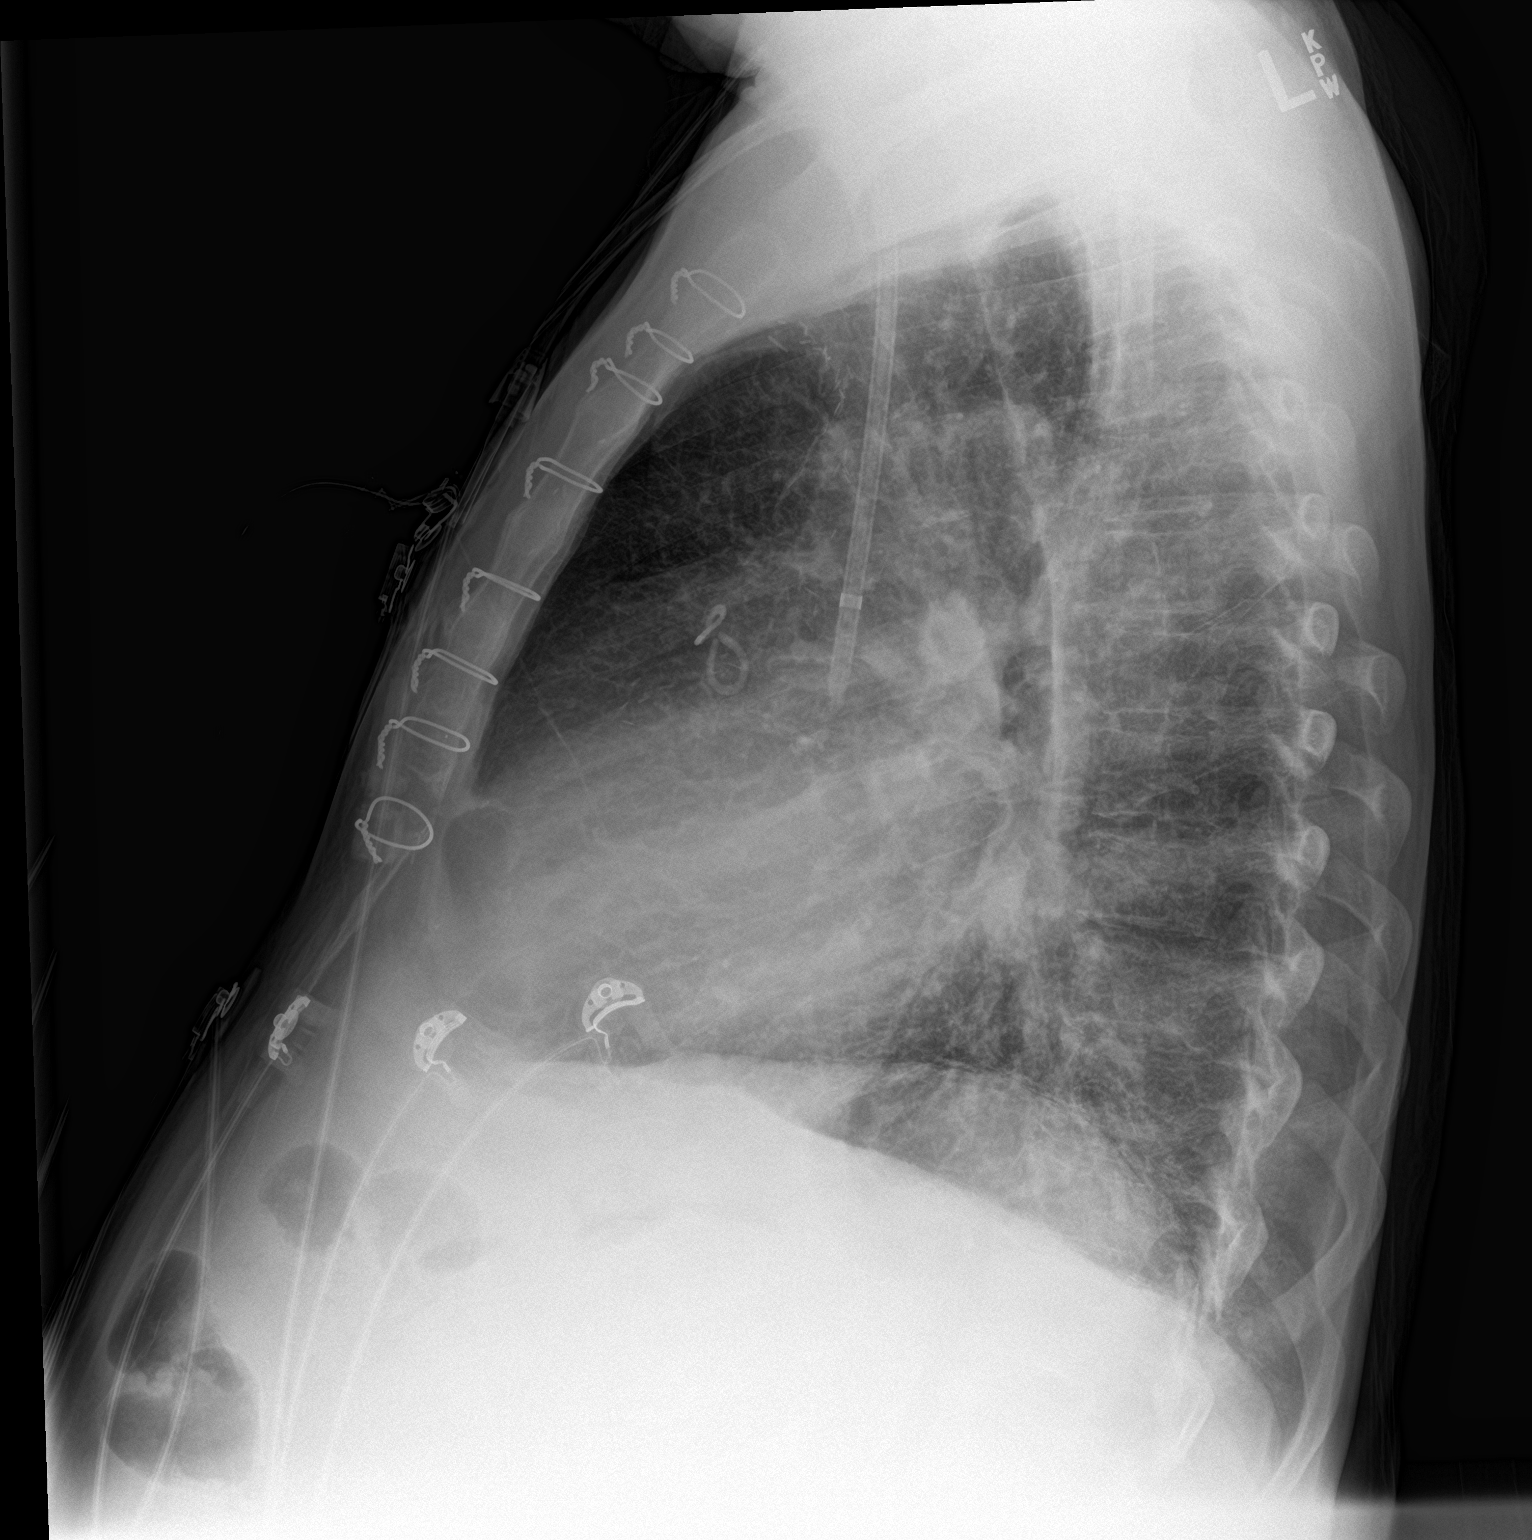

[2 of 2 positions shown; findings below may reference images not displayed]

FINDINGS: Stable cardiomegaly with aortic atherosclerosis. Central vascular
congestion with interstitial edema is redemonstrated. No
pneumothorax or effusion. There is atelectasis at the left lung
base. Right-sided dialysis catheter is stable in appearance with tip
in the distal SVC. No acute nor suspicious osseous abnormalities.
IMPRESSION: Cardiomegaly with aortic atherosclerosis. Central pulmonary vascular
congestion and interstitial edema is redemonstrated.

## 2020-07-12 IMAGING — DX DG CHEST 1V PORT
1 series · 1 of 1 positions shown · non-contrast
Comparison: December 06, 2017

CLINICAL DATA: Shortness of breath.  Renal failure

EXAM:
PORTABLE CHEST 1 VIEW

[chest ap]
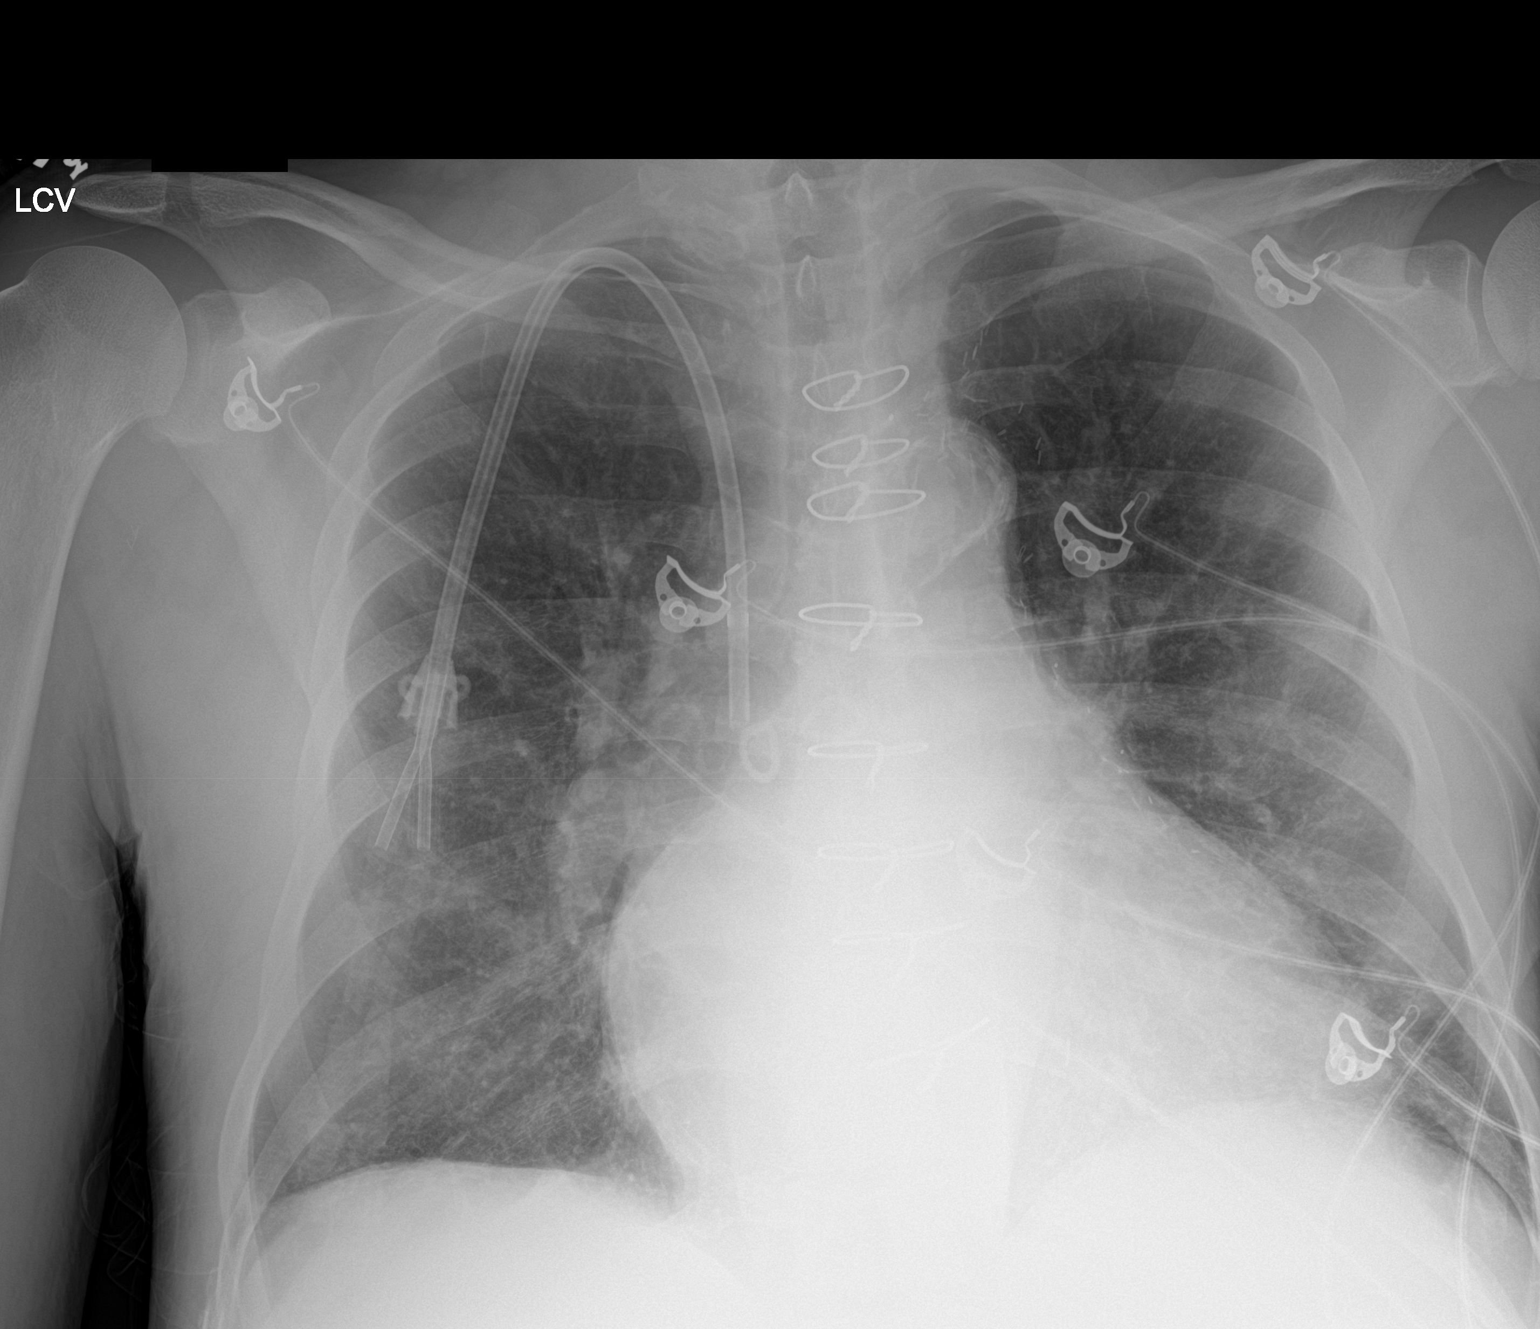

[1 of 1 positions shown; findings below may reference images not displayed]

FINDINGS: Central catheter tip is in the superior vena cava. No pneumothorax.
There is no appreciable edema or consolidation. There is
cardiomegaly with mild pulmonary venous hypertension. There is
aortic atherosclerosis. Patient is status post coronary artery
bypass grafting. No bone lesions evident.
IMPRESSION: Cardiomegaly with pulmonary vascular congestion. No edema or
consolidation. Central catheter tip in superior vena cava without
pneumothorax. There is aortic atherosclerosis.

Aortic Atherosclerosis ([id number]).

## 2020-07-14 IMAGING — US US PARACENTESIS
1 series · 5 of 5 positions shown · non-contrast
Comparison: none

INDICATION: 57-year-old male with alcoholic cirrhosis and recurrent large volume
ascites. He presents today for routine paracentesis.

[Series 1: us paracentesis · 5 of 5 slices shown]
[im 1/5]
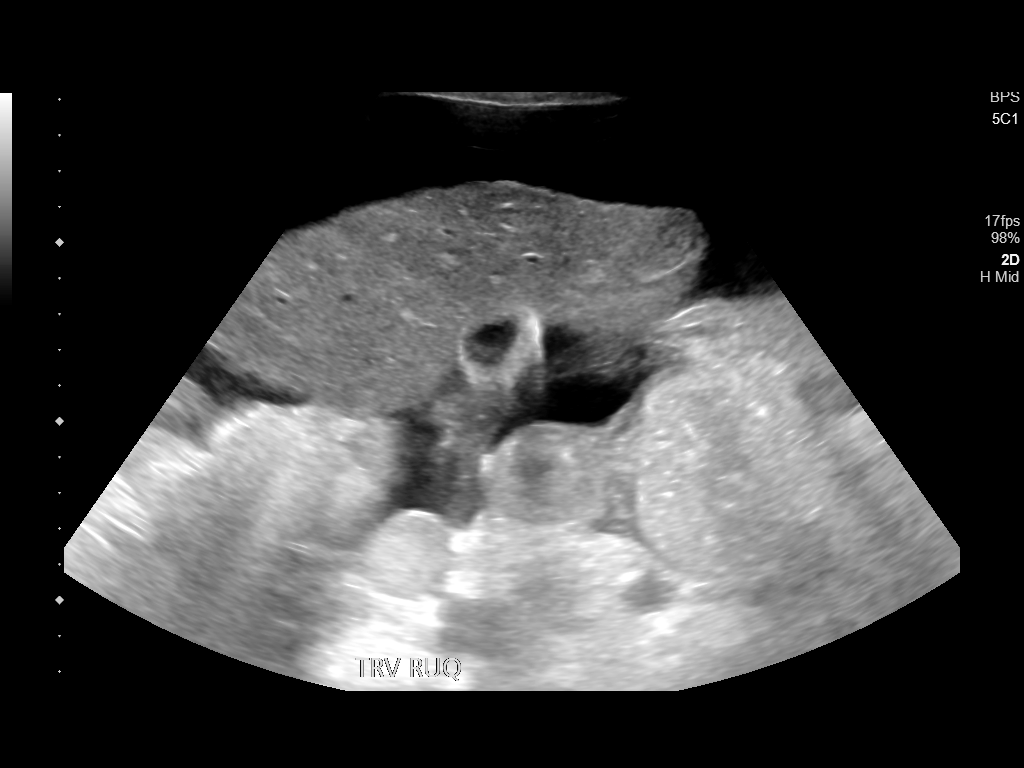
[im 2/5]
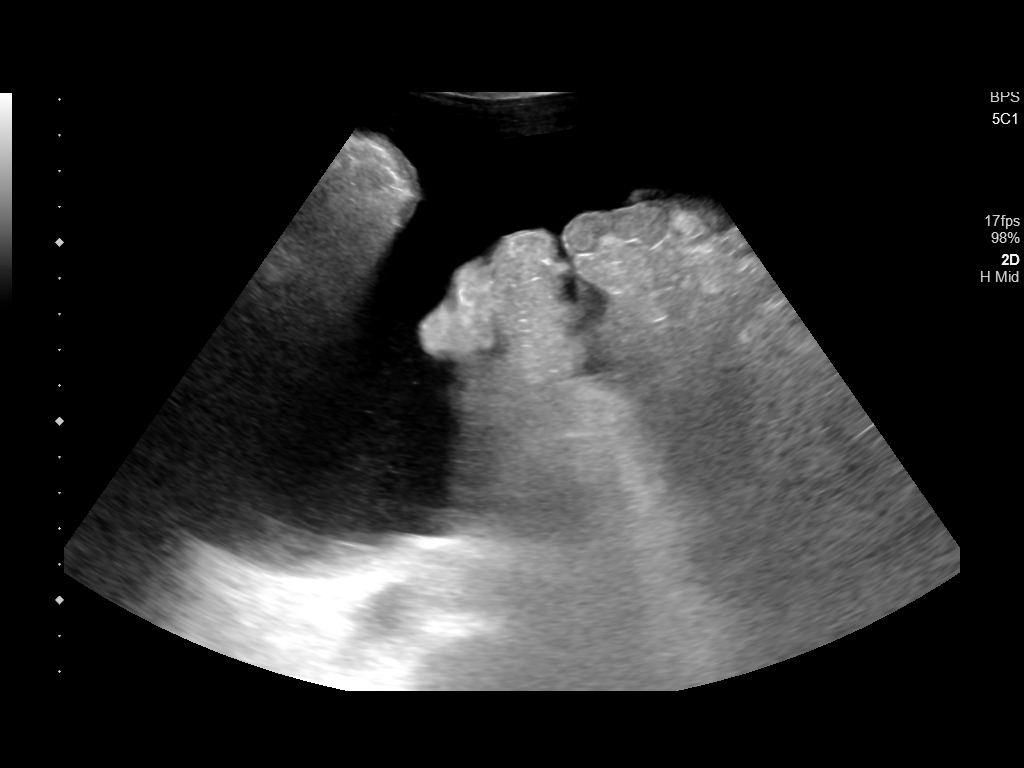
[im 3/5]
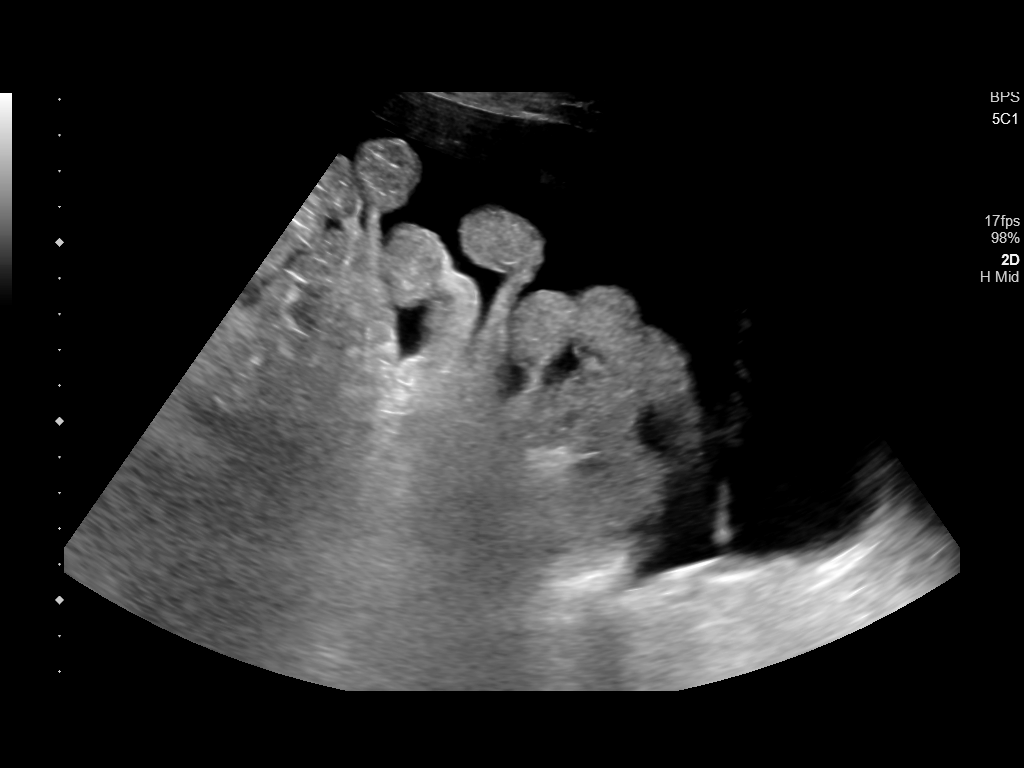
[im 4/5]
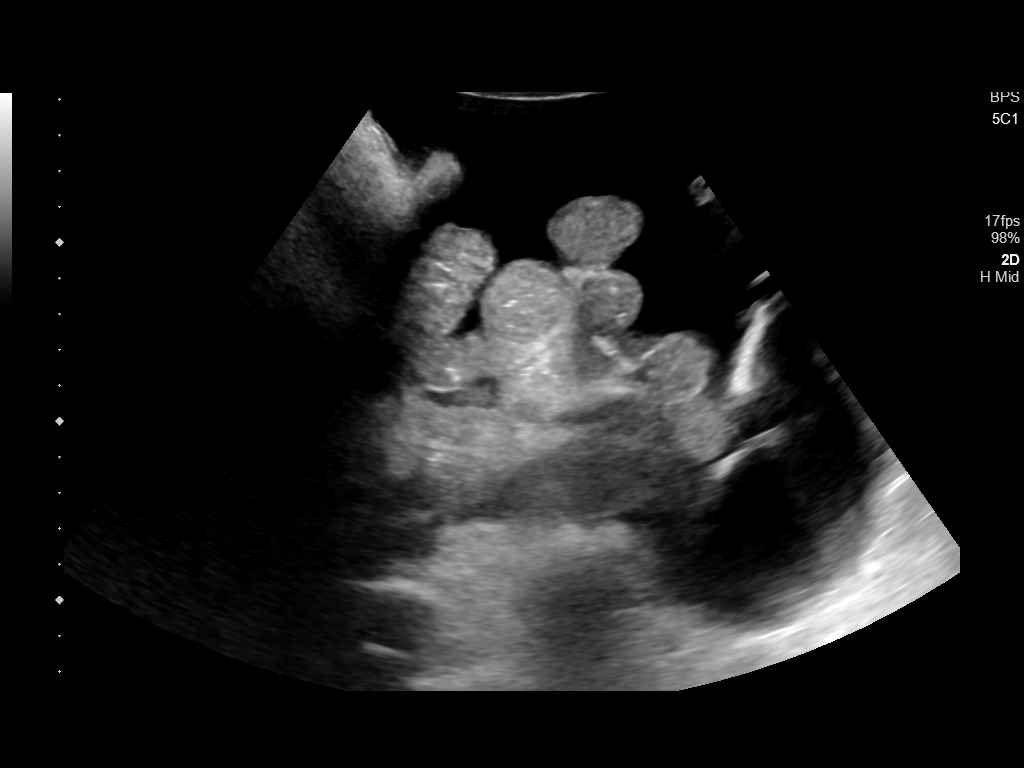
[im 5/5]
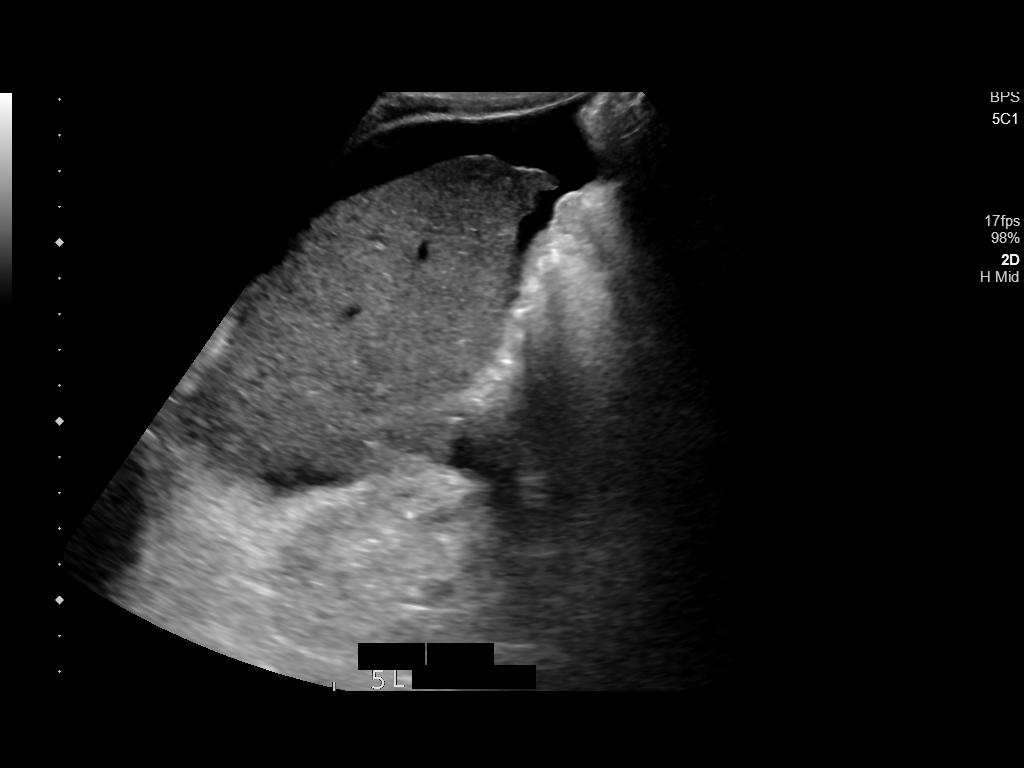

[5 of 5 positions shown; findings below may reference images not displayed]

EXAM:
ULTRASOUND GUIDED  PARACENTESIS

MEDICATIONS:
None.

COMPLICATIONS:
None immediate.

PROCEDURE:
Informed written consent was obtained from the patient after a
discussion of the risks, benefits and alternatives to treatment. A
timeout was performed prior to the initiation of the procedure.

Initial ultrasound scanning demonstrates a large amount of ascites
within the right lower abdominal quadrant. The right lower abdomen
was prepped and draped in the usual sterile fashion. 1% lidocaine
with epinephrine was used for local anesthesia.

Following this, a 6 Fr Safe-T-Centesis catheter was introduced. An
ultrasound image was saved for documentation purposes. The
paracentesis was performed. The catheter was removed and a dressing
was applied. The patient tolerated the procedure well without
immediate post procedural complication.
FINDINGS: A total of approximately 4111 mL of straw-colored ascitic fluid was
removed.
IMPRESSION: Successful ultrasound-guided paracentesis yielding 5 liters of
peritoneal fluid.

## 2020-07-26 IMAGING — CR DG CHEST 2V
2 series · 2 of 2 positions shown · non-contrast
Comparison: Chest x-ray dated 12/09/2017.

CLINICAL DATA: Shortness of breath.

EXAM:
CHEST - 2 VIEW

[chest lat]
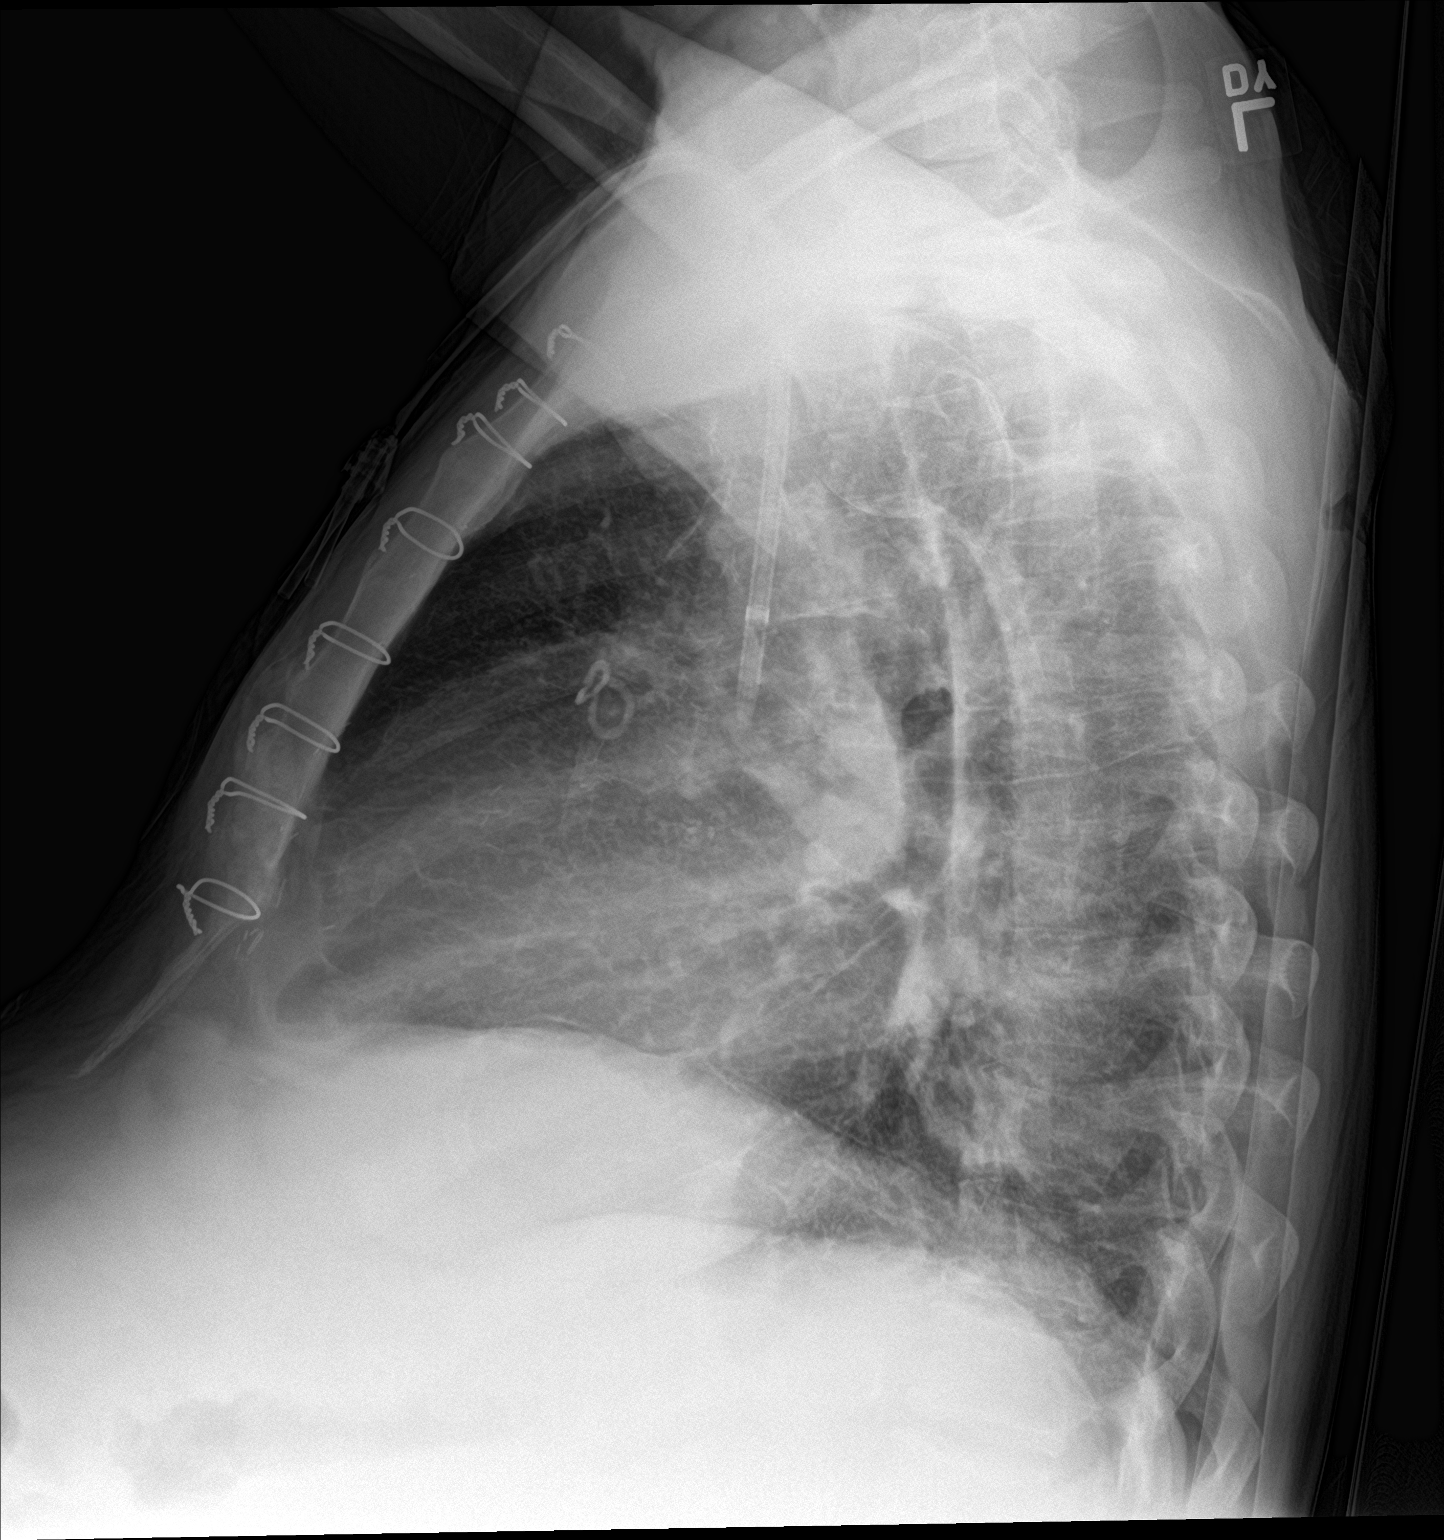

[chest ap]
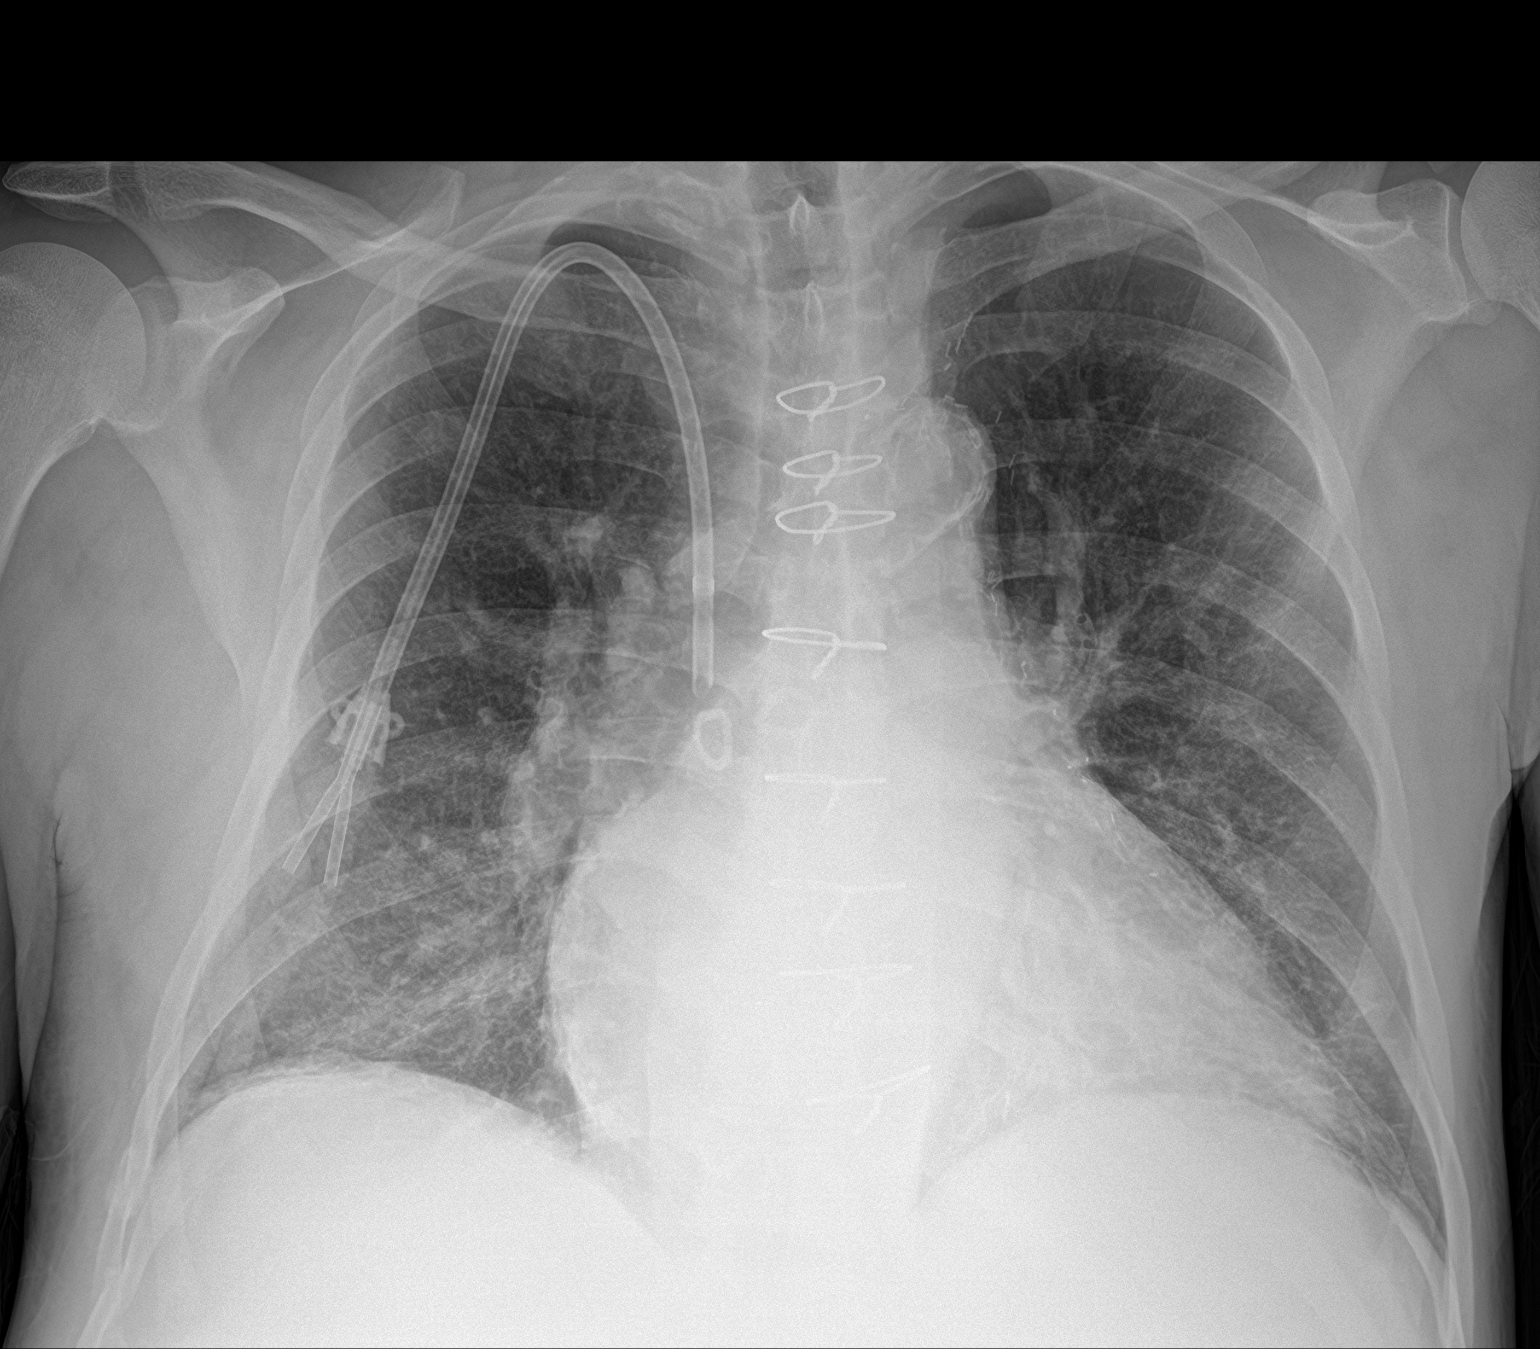

[2 of 2 positions shown; findings below may reference images not displayed]

FINDINGS: Stable cardiomegaly. Dialysis catheter is stable in position with
tip at the level of the mid SVC. Mildly prominent interstitial
markings noted bilaterally, slightly more prominent than on previous
studies suggesting mild interstitial edema related to CHF. No
confluent opacity to suggest pneumonia or alveolar pulmonary edema.
No pleural effusion or pneumothorax seen.
IMPRESSION: 1. Mild interstitial edema indicating mild CHF/volume overload.
2. Stable cardiomegaly.

## 2020-07-29 IMAGING — CR DG CHEST 2V
1 series · 2 of 2 positions shown · non-contrast
Comparison: 12/23/2017.

CLINICAL DATA: Fever.  Pulmonary edema.

EXAM:
CHEST - 2 VIEW

[Series 1: dg chest 2 view · 0.14mm/px · 2 of 2 slices shown]
[im 1/2]
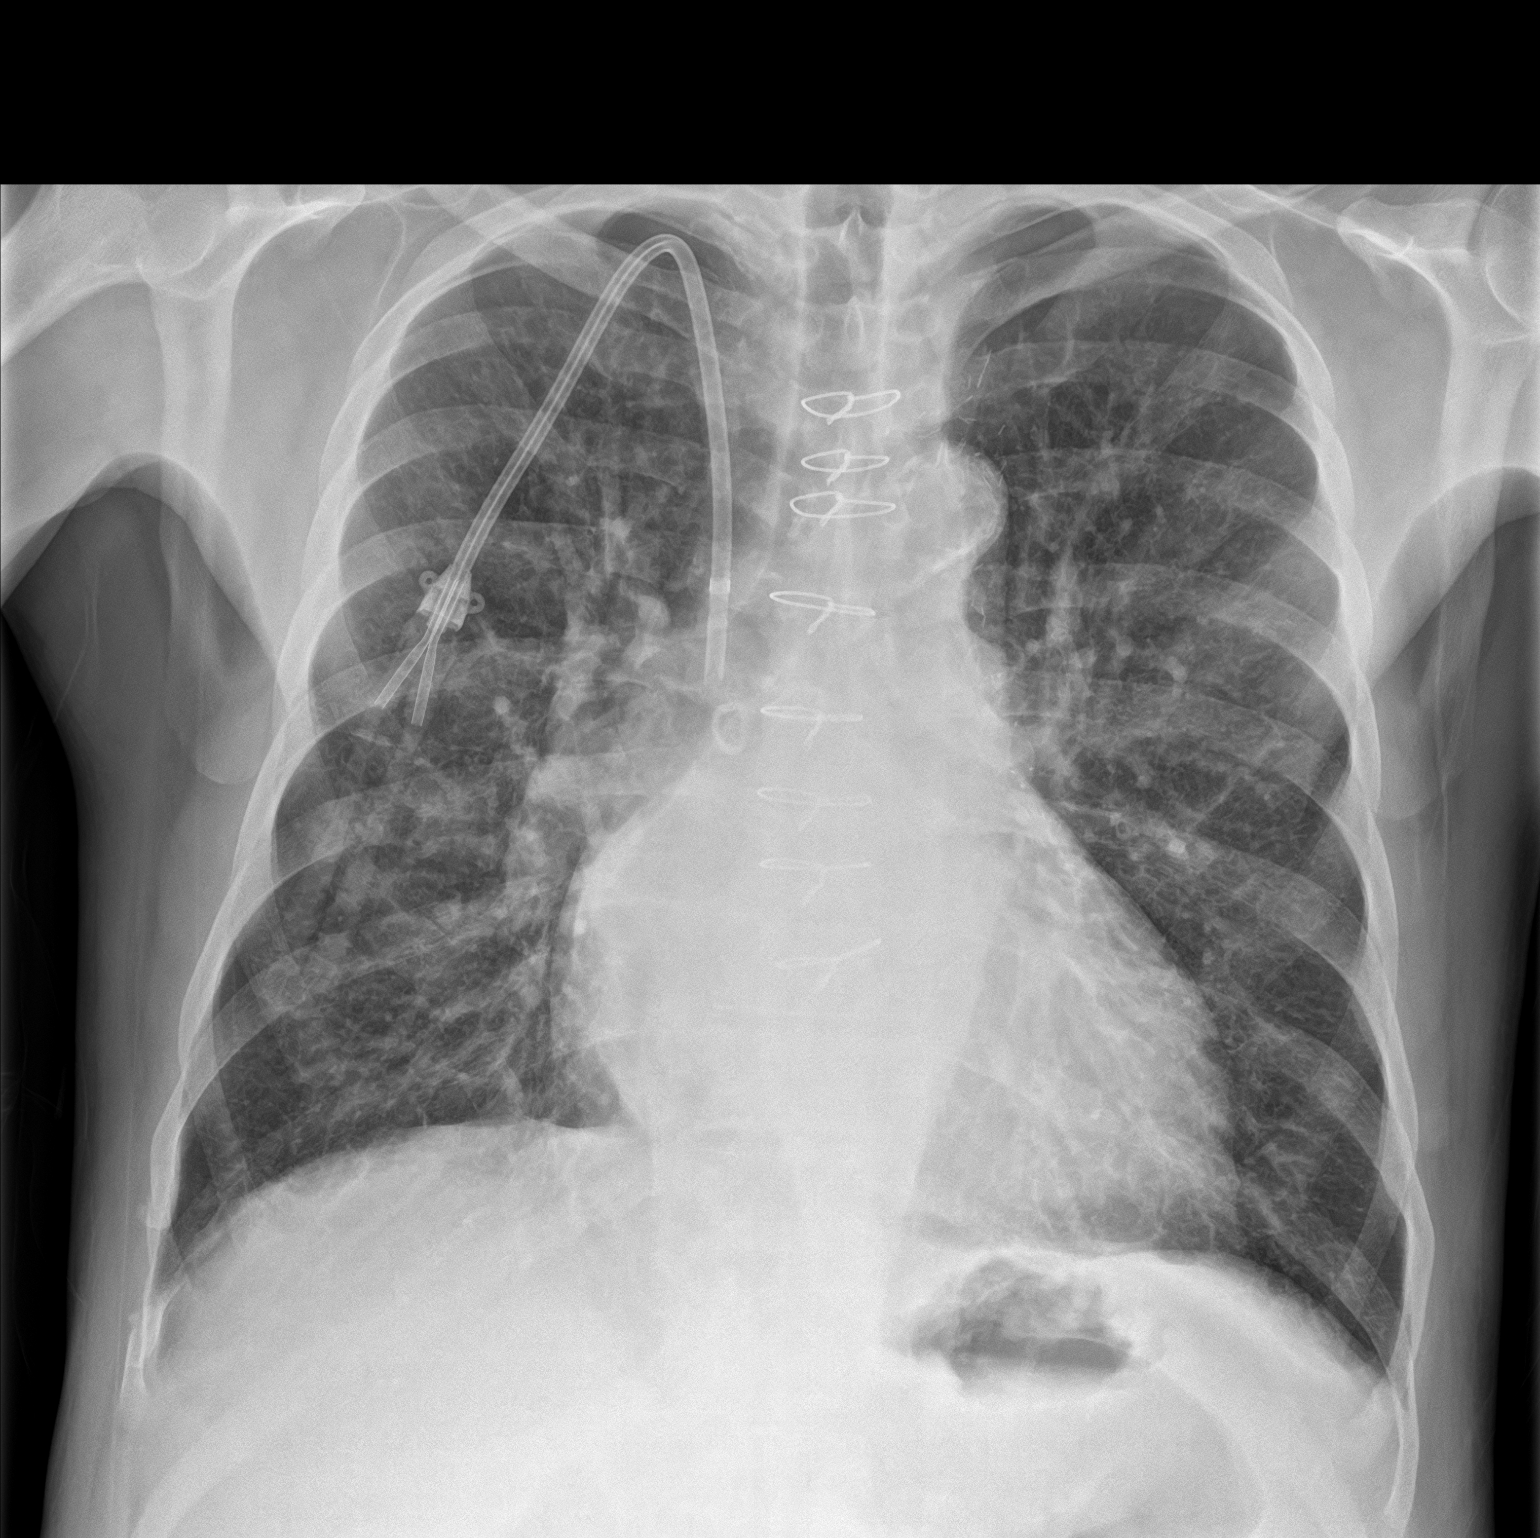
[im 2/2]
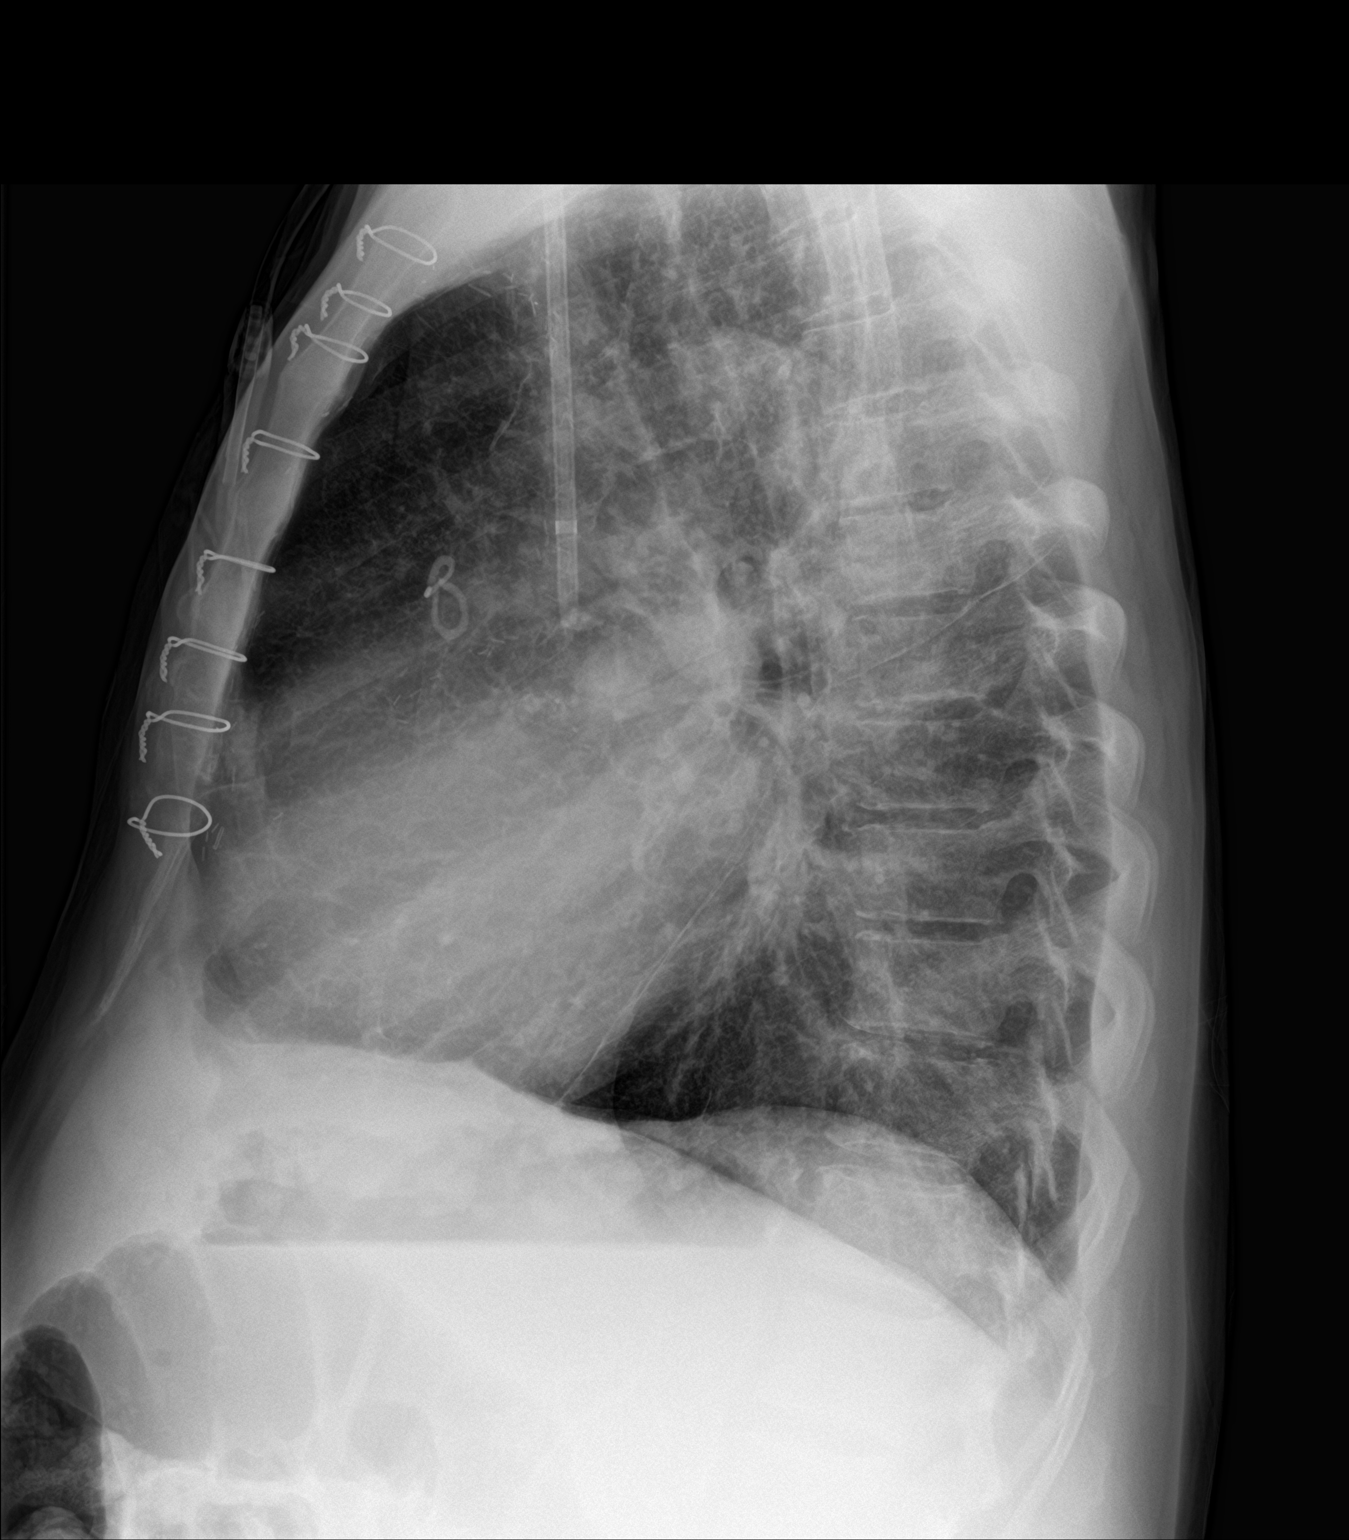

[2 of 2 positions shown; findings below may reference images not displayed]

FINDINGS: Dual-lumen catheter with tip over the superior vena cava. Prior
CABG. Cardiomegaly with pulmonary vascular prominence and bilateral
interstitial prominence suggesting mild CHF. Interim improvement
from prior exam. No pleural effusion. No pneumothorax. No acute bony
abnormality.
IMPRESSION: 1.  Dual-lumen catheter stable position.

2. Prior CABG. Cardiomegaly with pulmonary venous congestion
bilateral interstitial prominence consistent with CHF. Interim
improvement from prior exam.

## 2020-08-11 IMAGING — US US PARACENTESIS
1 series · 8 of 8 positions shown · non-contrast
Comparison: none

INDICATION: Recurrent ascites, renal failure, abdominal distension

[Series 1: us paracentesis · 0.28mm/px · 8 of 8 slices shown]
[im 1/8]
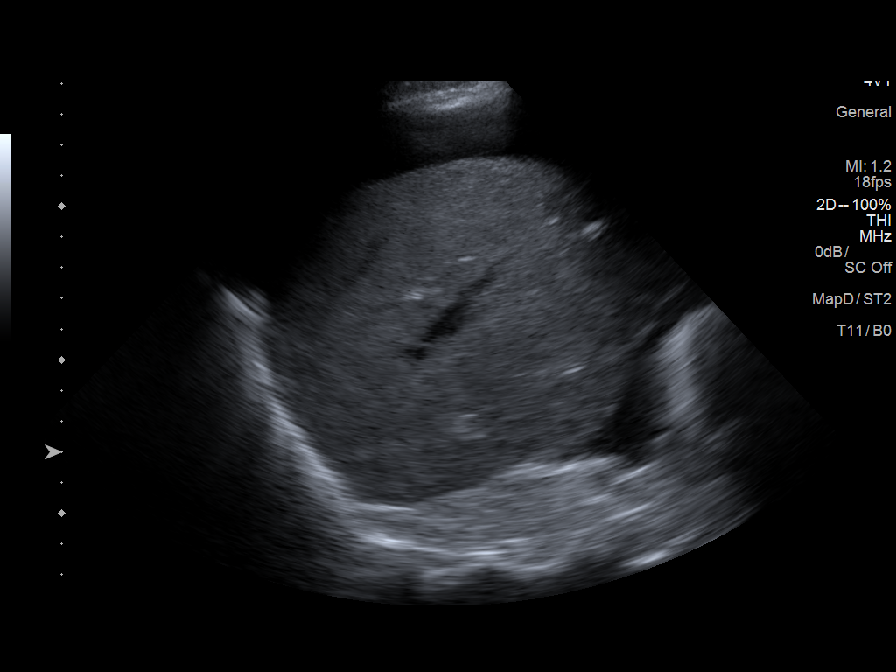
[im 2/8]
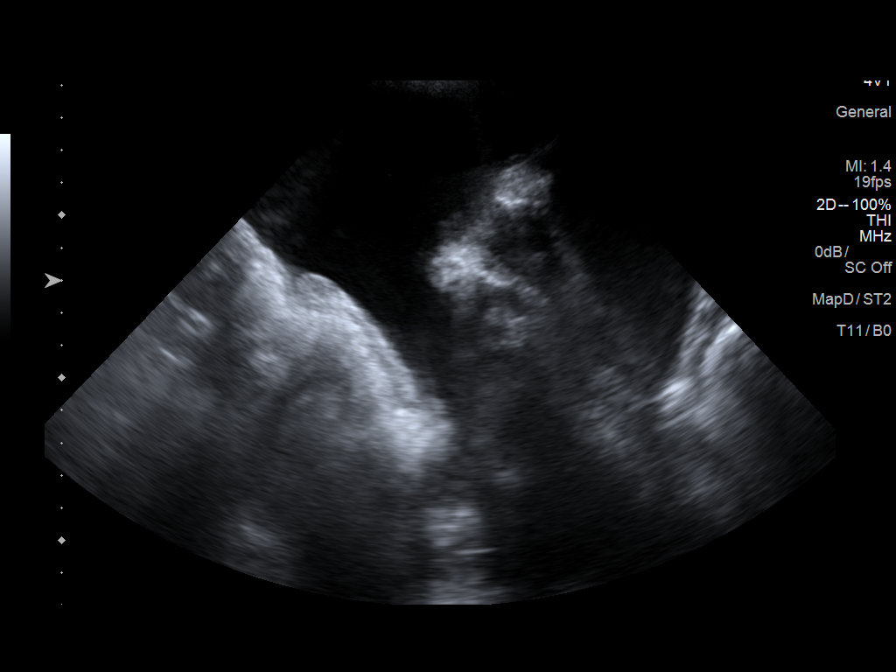
[im 3/8]
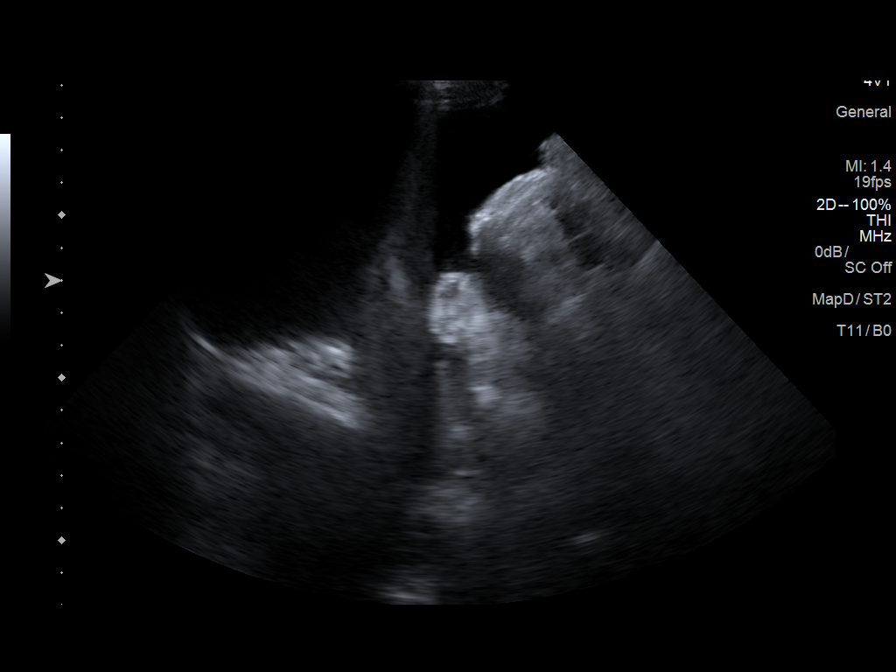
[im 4/8]
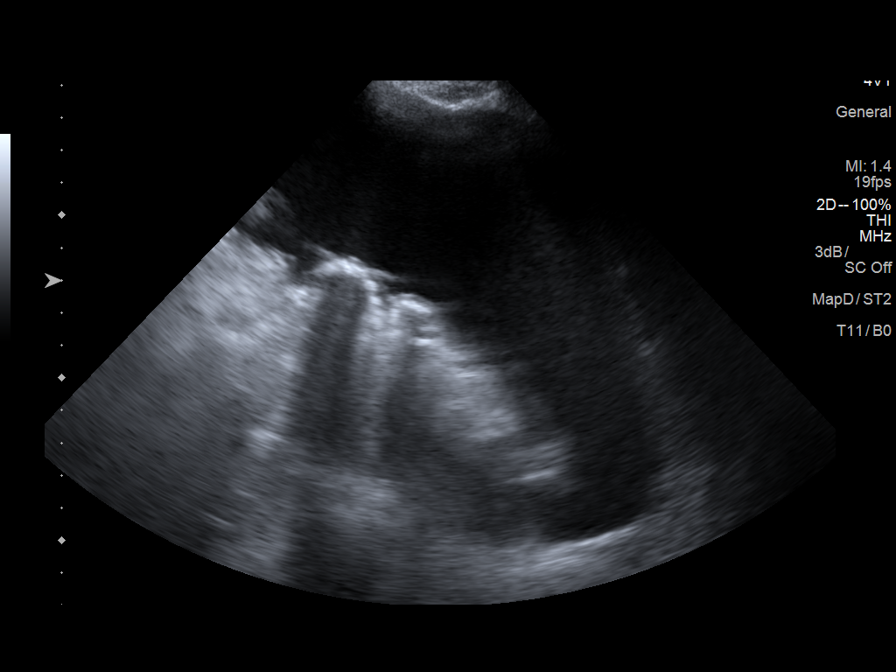
[im 5/8]
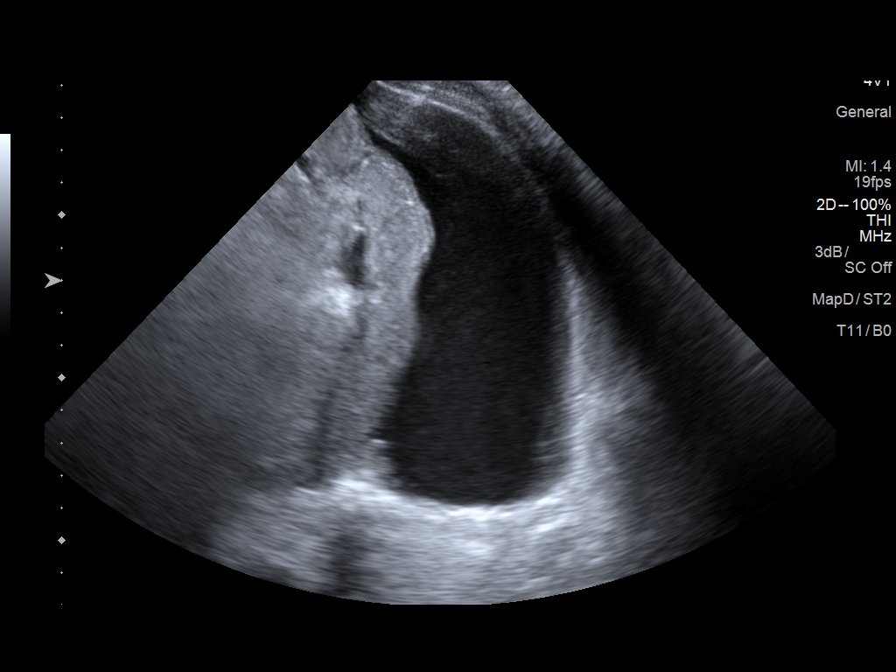
[im 6/8]
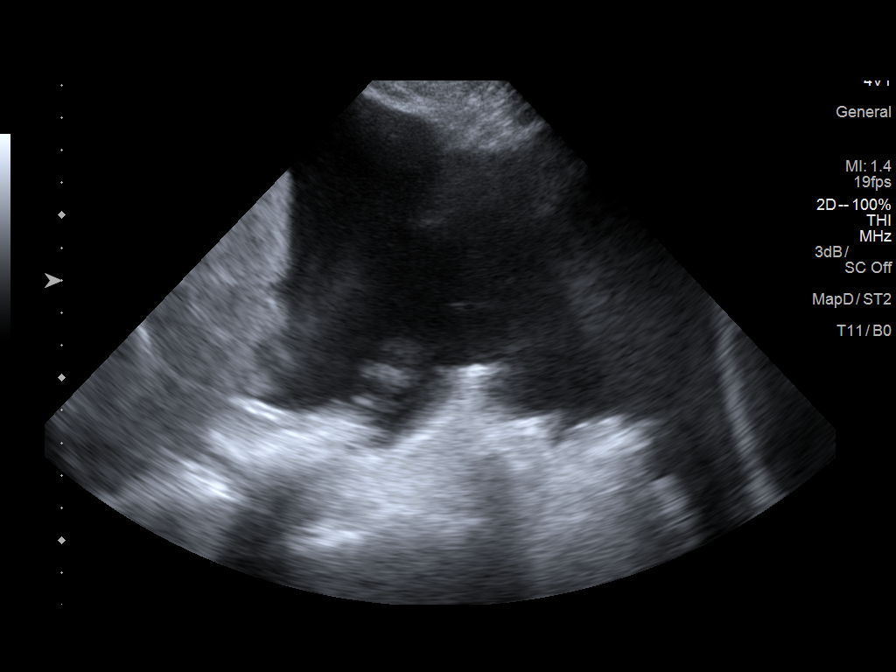
[im 7/8]
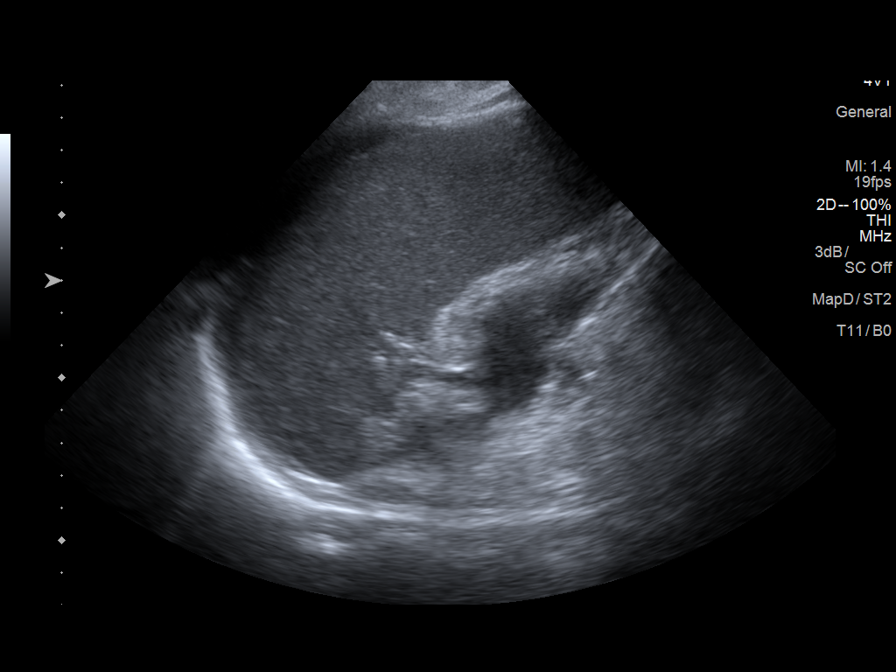
[im 8/8]
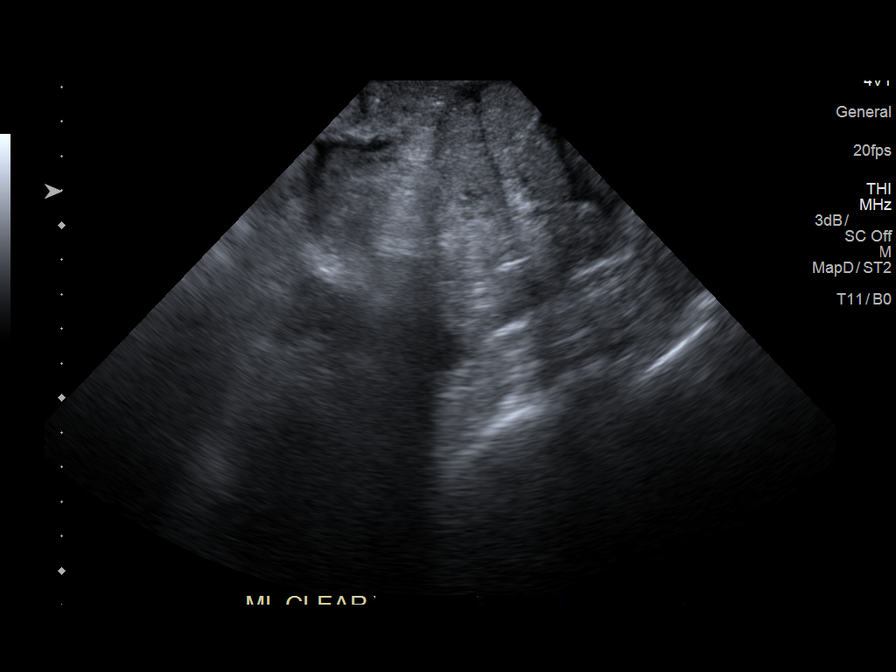

[8 of 8 positions shown; findings below may reference images not displayed]

EXAM:
ULTRASOUND GUIDED left PARACENTESIS

MEDICATIONS:
1% lidocaine local

COMPLICATIONS:
None immediate.

PROCEDURE:
Informed written consent was obtained from the patient after a
discussion of the risks, benefits and alternatives to treatment. A
timeout was performed prior to the initiation of the procedure.

Initial ultrasound scanning demonstrates a large amount of ascites
within the left lower abdominal quadrant. The right lower abdomen
was prepped and draped in the usual sterile fashion. 1% lidocaine
with epinephrine was used for local anesthesia.

Following this, a 6 Fr Safe-T-Centesis catheter was introduced. An
ultrasound image was saved for documentation purposes. The
paracentesis was performed. The catheter was removed and a dressing
was applied. The patient tolerated the procedure well without
immediate post procedural complication.
FINDINGS: A total of approximately 6.75 of clear peritoneal fluid was removed.
Sample was not sent for laboratory analysis
IMPRESSION: Successful ultrasound-guided paracentesis yielding 6.75 liters of
peritoneal fluid.
